# Patient Record
Sex: Female | Born: 1945
Health system: Southern US, Community
[De-identification: ages and names within clinical notes are randomized; demographics above are authoritative.]

## PROBLEM LIST (undated history)

## (undated) DIAGNOSIS — K219 Gastro-esophageal reflux disease without esophagitis: Secondary | ICD-10-CM

## (undated) DIAGNOSIS — E7800 Pure hypercholesterolemia, unspecified: Secondary | ICD-10-CM

## (undated) DIAGNOSIS — Z800 Family history of malignant neoplasm of digestive organs: Secondary | ICD-10-CM

## (undated) DIAGNOSIS — I10 Essential (primary) hypertension: Secondary | ICD-10-CM

## (undated) DIAGNOSIS — M1990 Unspecified osteoarthritis, unspecified site: Secondary | ICD-10-CM

## (undated) DIAGNOSIS — C801 Malignant (primary) neoplasm, unspecified: Secondary | ICD-10-CM

## (undated) DIAGNOSIS — I8290 Acute embolism and thrombosis of unspecified vein: Secondary | ICD-10-CM

## (undated) DIAGNOSIS — R600 Localized edema: Secondary | ICD-10-CM

## (undated) DIAGNOSIS — R32 Unspecified urinary incontinence: Secondary | ICD-10-CM

## (undated) DIAGNOSIS — Z9221 Personal history of antineoplastic chemotherapy: Secondary | ICD-10-CM

## (undated) DIAGNOSIS — T7840XA Allergy, unspecified, initial encounter: Secondary | ICD-10-CM

## (undated) HISTORY — DX: Acute embolism and thrombosis of unspecified vein: I82.90

## (undated) HISTORY — DX: Family history of malignant neoplasm of digestive organs: Z80.0

## (undated) HISTORY — PX: ABDOMINAL HYSTERECTOMY: SHX81

## (undated) HISTORY — DX: Pure hypercholesterolemia, unspecified: E78.00

## (undated) HISTORY — DX: Allergy, unspecified, initial encounter: T78.40XA

## (undated) HISTORY — DX: Unspecified urinary incontinence: R32

## (undated) MED FILL — Dexamethasone Sodium Phosphate Inj 100 MG/10ML: INTRAMUSCULAR | Qty: 1 | Status: AC

---
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
## 1969-10-16 HISTORY — PX: CHOLECYSTECTOMY: SHX55

## 2004-09-18 ENCOUNTER — Emergency Department: Payer: Self-pay | Admitting: Unknown Physician Specialty

## 2007-10-03 ENCOUNTER — Ambulatory Visit: Payer: Self-pay

## 2008-10-20 ENCOUNTER — Ambulatory Visit: Payer: Self-pay

## 2009-12-10 ENCOUNTER — Ambulatory Visit: Payer: Self-pay | Admitting: Family Medicine

## 2011-02-02 ENCOUNTER — Ambulatory Visit: Payer: Self-pay

## 2012-02-13 ENCOUNTER — Ambulatory Visit: Payer: Self-pay | Admitting: Internal Medicine

## 2013-02-19 ENCOUNTER — Ambulatory Visit: Payer: Self-pay

## 2014-02-20 ENCOUNTER — Ambulatory Visit: Payer: Self-pay | Admitting: Internal Medicine

## 2015-01-29 ENCOUNTER — Other Ambulatory Visit: Payer: Self-pay | Admitting: Internal Medicine

## 2015-01-29 DIAGNOSIS — Z1231 Encounter for screening mammogram for malignant neoplasm of breast: Secondary | ICD-10-CM

## 2015-02-03 DIAGNOSIS — Z8742 Personal history of other diseases of the female genital tract: Secondary | ICD-10-CM | POA: Insufficient documentation

## 2015-02-23 ENCOUNTER — Ambulatory Visit
Admission: RE | Admit: 2015-02-23 | Discharge: 2015-02-23 | Disposition: A | Discharge status: Home / Self Care | Payer: Medicare Other | Source: Ambulatory Visit | Attending: Internal Medicine | Admitting: Internal Medicine

## 2015-02-23 DIAGNOSIS — Z1231 Encounter for screening mammogram for malignant neoplasm of breast: Secondary | ICD-10-CM

## 2016-01-12 ENCOUNTER — Other Ambulatory Visit: Payer: Self-pay | Admitting: Internal Medicine

## 2016-01-12 DIAGNOSIS — Z1231 Encounter for screening mammogram for malignant neoplasm of breast: Secondary | ICD-10-CM

## 2016-02-24 ENCOUNTER — Other Ambulatory Visit: Payer: Self-pay | Admitting: Internal Medicine

## 2016-02-24 ENCOUNTER — Ambulatory Visit
Admission: RE | Admit: 2016-02-24 | Discharge: 2016-02-24 | Disposition: A | Discharge status: Home / Self Care | Payer: Medicare Other | Source: Ambulatory Visit | Attending: Internal Medicine | Admitting: Internal Medicine

## 2016-02-24 DIAGNOSIS — Z1231 Encounter for screening mammogram for malignant neoplasm of breast: Secondary | ICD-10-CM | POA: Insufficient documentation

## 2016-06-15 ENCOUNTER — Emergency Department
Admission: EM | Admit: 2016-06-15 | Discharge: 2016-06-15 | Disposition: A | Discharge status: Home / Self Care | Payer: No Typology Code available for payment source | Attending: Emergency Medicine | Admitting: Emergency Medicine

## 2016-06-15 ENCOUNTER — Emergency Department: Payer: No Typology Code available for payment source

## 2016-06-15 DIAGNOSIS — Z87891 Personal history of nicotine dependence: Secondary | ICD-10-CM | POA: Insufficient documentation

## 2016-06-15 DIAGNOSIS — Y92410 Unspecified street and highway as the place of occurrence of the external cause: Secondary | ICD-10-CM | POA: Insufficient documentation

## 2016-06-15 DIAGNOSIS — Y939 Activity, unspecified: Secondary | ICD-10-CM | POA: Insufficient documentation

## 2016-06-15 DIAGNOSIS — Y999 Unspecified external cause status: Secondary | ICD-10-CM | POA: Diagnosis not present

## 2016-06-15 DIAGNOSIS — R0789 Other chest pain: Secondary | ICD-10-CM | POA: Diagnosis present

## 2016-06-15 DIAGNOSIS — I10 Essential (primary) hypertension: Secondary | ICD-10-CM | POA: Diagnosis not present

## 2016-06-15 DIAGNOSIS — I6782 Cerebral ischemia: Secondary | ICD-10-CM | POA: Insufficient documentation

## 2016-06-15 HISTORY — DX: Essential (primary) hypertension: I10

## 2016-06-15 MED ORDER — ACETAMINOPHEN 500 MG PO TABS
1000.0000 mg | ORAL_TABLET | Freq: Once | ORAL | Status: AC
  Administered 2016-06-15: 1000 mg via ORAL
  Filled 2016-06-15: qty 2
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
## 2017-01-12 ENCOUNTER — Other Ambulatory Visit: Payer: Self-pay | Admitting: Internal Medicine

## 2017-01-12 DIAGNOSIS — Z1231 Encounter for screening mammogram for malignant neoplasm of breast: Secondary | ICD-10-CM

## 2017-02-26 ENCOUNTER — Ambulatory Visit
Admission: RE | Admit: 2017-02-26 | Discharge: 2017-02-26 | Disposition: A | Discharge status: Home / Self Care | Payer: Medicare Other | Source: Ambulatory Visit | Attending: Internal Medicine | Admitting: Internal Medicine

## 2017-02-26 DIAGNOSIS — Z1231 Encounter for screening mammogram for malignant neoplasm of breast: Secondary | ICD-10-CM | POA: Diagnosis present

## 2018-01-18 ENCOUNTER — Other Ambulatory Visit: Payer: Self-pay | Admitting: Internal Medicine

## 2018-01-18 DIAGNOSIS — Z1231 Encounter for screening mammogram for malignant neoplasm of breast: Secondary | ICD-10-CM

## 2018-02-28 ENCOUNTER — Ambulatory Visit
Admission: RE | Admit: 2018-02-28 | Discharge: 2018-02-28 | Disposition: A | Discharge status: Home / Self Care | Payer: Medicare Other | Source: Ambulatory Visit | Attending: Internal Medicine | Admitting: Internal Medicine

## 2018-02-28 DIAGNOSIS — Z1231 Encounter for screening mammogram for malignant neoplasm of breast: Secondary | ICD-10-CM | POA: Insufficient documentation

## 2018-08-16 ENCOUNTER — Other Ambulatory Visit (HOSPITAL_COMMUNITY)
Admission: RE | Admit: 2018-08-16 | Discharge: 2018-08-16 | Disposition: A | Discharge status: Home / Self Care | Payer: Medicare Other | Source: Ambulatory Visit | Attending: Certified Nurse Midwife | Admitting: Certified Nurse Midwife

## 2018-08-16 ENCOUNTER — Encounter: Payer: Self-pay | Admitting: Certified Nurse Midwife

## 2018-08-16 ENCOUNTER — Ambulatory Visit: Payer: Medicare Other | Admitting: Certified Nurse Midwife

## 2018-08-16 VITALS — BP 127/75 | HR 91 | Ht 64.0 in | Wt 237.5 lb

## 2018-08-16 DIAGNOSIS — N950 Postmenopausal bleeding: Secondary | ICD-10-CM | POA: Insufficient documentation

## 2018-08-16 DIAGNOSIS — Z9889 Other specified postprocedural states: Secondary | ICD-10-CM

## 2018-08-16 DIAGNOSIS — Z87440 Personal history of urinary (tract) infections: Secondary | ICD-10-CM

## 2018-08-16 DIAGNOSIS — R32 Unspecified urinary incontinence: Secondary | ICD-10-CM | POA: Diagnosis not present

## 2018-08-16 DIAGNOSIS — Z8742 Personal history of other diseases of the female genital tract: Secondary | ICD-10-CM
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
## 2018-08-16 NOTE — Patient Instructions (Addendum)
Postmenopausal Bleeding Postmenopausal bleeding is any bleeding a woman has after she has entered into menopause. Menopause is the end of a woman's fertile years. After menopause, a woman no longer ovulates or has menstrual periods. Postmenopausal bleeding can be caused by various things. Any type of postmenopausal bleeding, even if it appears to be a typical menstrual period, is concerning. This should be evaluated by your health care provider. Any treatment will depend on the cause of the bleeding. Follow these instructions at home: Monitor your condition for any changes. The following actions may help to alleviate any discomfort you are experiencing:  Avoid the use of tampons and douches as directed by your health care provider.  Change your pads frequently.  Get regular pelvic exams and Pap tests.  Keep all follow-up appointments for diagnostic tests as directed by your health care provider.  Contact a health care provider if:  Your bleeding lasts more than 1 week.  You have abdominal pain.  You have bleeding with sexual intercourse. Get help right away if:  You have a fever, chills, headache, dizziness, muscle aches, and bleeding.  You have severe pain with bleeding.  You are passing blood clots.  You have bleeding and need more than 1 pad an hour.  You feel faint. This information is not intended to replace advice given to you by your health care provider. Make sure you discuss any questions you have with your health care provider. Document Released: 01/10/2006 Document Revised: 03/09/2016 Document Reviewed: 05/01/2013 Elsevier Interactive Patient Education  2018 Elsevier Inc.  

## 2018-08-18 LAB — URINE CULTURE

## 2018-08-19 ENCOUNTER — Telehealth: Payer: Self-pay | Admitting: Certified Nurse Midwife

## 2018-08-19 LAB — CERVICOVAGINAL ANCILLARY ONLY
Bacterial vaginitis: NEGATIVE
CANDIDA VAGINITIS: NEGATIVE

## 2018-08-19 NOTE — Telephone Encounter (Signed)
Spoke with patient, results given.  Patient verbalized understanding.

## 2018-08-19 NOTE — Telephone Encounter (Signed)
The patient asked to speak to Michelle's nurse regarding her recent visit, please advise, thanks.

## 2018-08-19 NOTE — Progress Notes (Signed)
Please contact patient, swab and urine negative for infection. Please keep follow up appointment as scheduled. Thanks, JML

## 2018-08-20 ENCOUNTER — Other Ambulatory Visit: Payer: Self-pay | Admitting: Certified Nurse Midwife

## 2018-08-20 DIAGNOSIS — N950 Postmenopausal bleeding: Secondary | ICD-10-CM

## 2018-08-22 ENCOUNTER — Other Ambulatory Visit: Payer: Medicare Other

## 2018-08-22 ENCOUNTER — Encounter: Payer: Medicare Other | Admitting: Obstetrics and Gynecology

## 2018-10-11 ENCOUNTER — Encounter: Payer: Self-pay | Admitting: Certified Nurse Midwife

## 2018-10-11 ENCOUNTER — Ambulatory Visit: Payer: Medicare Other | Admitting: Certified Nurse Midwife

## 2018-10-11 VITALS — BP 138/63 | HR 89 | Ht 64.0 in | Wt 228.6 lb

## 2018-10-11 DIAGNOSIS — N950 Postmenopausal bleeding: Secondary | ICD-10-CM

## 2018-10-11 NOTE — Patient Instructions (Signed)
Postmenopausal Bleeding    Postmenopausal bleeding is any bleeding that happens after menopause. Menopause is when a woman's period stops. Any type of bleeding after menopause should be checked by your doctor. Treatment will depend on the cause.  Follow these instructions at home:   Pay attention to any changes in your symptoms.   Avoid using tampons and douches as told by your doctor.   Change your pads regularly.   Get regular pelvic exams and Pap tests.   Take iron pills as told by your doctor.   Take over-the-counter and prescription medicines only as told by your doctor.   Keep all follow-up visits as told by your doctor. This is important.  Contact a doctor if:   Your bleeding lasts for more than 1 week.   You have belly (abdominal) pain.   You have bleeding during or after sex (intercourse).   You have bleeding that happens more often than every 3 weeks.  Get help right away if:   You have a fever, chills, a headache, dizziness, muscle aches, and bleeding.   You have strong pain with bleeding.   You have clumps of blood (blood clots) coming from your vagina.   You have a lot of bleeding and:  ? You need more than 1 pad an hour.  ? This has never happened before.   You feel like you are going to pass out (faint).  Summary   Any type of bleeding after menopause should be checked by your doctor.   Pay attention to any changes in your symptoms.   Keep all follow-up visits as told by your doctor.  This information is not intended to replace advice given to you by your health care provider. Make sure you discuss any questions you have with your health care provider.  Document Released: 07/11/2008 Document Revised: 11/07/2016 Document Reviewed: 11/07/2016  Elsevier Interactive Patient Education  2019 Elsevier Inc.

## 2018-10-11 NOTE — Progress Notes (Signed)
Patient seen on 08/16/2018 for similar symptoms and advised to schedule ultrasound and follow up visit with Dr. Marcelline Mates. Appointment scheduled for 08/22/2018 cancelled by patient. Presents today for follow up appointment. No ultrasound in office today. Patient rescheduled for 10/29/2018 for ultrasound and follow up visit with Dr. Marcelline Mates.    [name], CNM [facility], [facility] 10/11/18 12:04 PM

## 2018-10-14 DIAGNOSIS — R7301 Impaired fasting glucose: Secondary | ICD-10-CM | POA: Insufficient documentation

## 2018-10-29 ENCOUNTER — Ambulatory Visit: Payer: Medicare Other | Admitting: Obstetrics and Gynecology

## 2018-10-29 ENCOUNTER — Encounter: Payer: Self-pay | Admitting: Obstetrics and Gynecology

## 2018-10-29 ENCOUNTER — Ambulatory Visit (INDEPENDENT_AMBULATORY_CARE_PROVIDER_SITE_OTHER): Payer: Medicare Other

## 2018-10-29 ENCOUNTER — Other Ambulatory Visit (HOSPITAL_COMMUNITY)
Admission: RE | Admit: 2018-10-29 | Discharge: 2018-10-29 | Disposition: A | Discharge status: Home / Self Care | Payer: Medicare Other | Source: Ambulatory Visit | Attending: Obstetrics and Gynecology | Admitting: Obstetrics and Gynecology

## 2018-10-29 VITALS — BP 128/74 | HR 73 | Ht 64.0 in | Wt 229.1 lb

## 2018-10-29 DIAGNOSIS — R9389 Abnormal findings on diagnostic imaging of other specified body structures: Secondary | ICD-10-CM

## 2018-10-29 DIAGNOSIS — N183 Chronic kidney disease, stage 3 (moderate): Secondary | ICD-10-CM

## 2018-10-29 DIAGNOSIS — E785 Hyperlipidemia, unspecified: Secondary | ICD-10-CM | POA: Insufficient documentation

## 2018-10-29 DIAGNOSIS — J309 Allergic rhinitis, unspecified: Secondary | ICD-10-CM | POA: Insufficient documentation

## 2018-10-29 DIAGNOSIS — N182 Chronic kidney disease, stage 2 (mild): Secondary | ICD-10-CM | POA: Insufficient documentation

## 2018-10-29 DIAGNOSIS — N950 Postmenopausal bleeding: Secondary | ICD-10-CM

## 2018-10-29 DIAGNOSIS — E669 Obesity, unspecified: Secondary | ICD-10-CM | POA: Diagnosis not present

## 2018-10-29 DIAGNOSIS — I129 Hypertensive chronic kidney disease with stage 1 through stage 4 chronic kidney disease, or unspecified chronic kidney disease: Secondary | ICD-10-CM | POA: Insufficient documentation

## 2018-10-29 DIAGNOSIS — N888 Other specified noninflammatory disorders of cervix uteri: Secondary | ICD-10-CM | POA: Diagnosis not present

## 2018-10-29 DIAGNOSIS — Z91410 Personal history of adult physical and sexual abuse: Secondary | ICD-10-CM
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
## 2018-10-29 NOTE — Progress Notes (Signed)
Pt stated that she is having some burning the vaginal area that she noticed about several months ago along with abnormal bleeding. Pt stated she is also is having odor in the vaginal area. Pt stated that she thinks she may need some clean to help clear up the area.

## 2018-10-31 LAB — CYTOLOGY - PAP

## 2018-11-08 ENCOUNTER — Encounter: Payer: Self-pay | Admitting: Obstetrics and Gynecology

## 2018-11-08 ENCOUNTER — Ambulatory Visit: Payer: Medicare Other | Admitting: Obstetrics and Gynecology

## 2018-11-08 VITALS — BP 147/84 | HR 80 | Ht 64.0 in | Wt 228.6 lb

## 2018-11-08 DIAGNOSIS — N950 Postmenopausal bleeding: Secondary | ICD-10-CM

## 2018-11-08 DIAGNOSIS — R030 Elevated blood-pressure reading, without diagnosis of hypertension: Secondary | ICD-10-CM

## 2018-11-08 DIAGNOSIS — C541 Malignant neoplasm of endometrium: Secondary | ICD-10-CM

## 2018-11-08 DIAGNOSIS — Z91410 Personal history of adult physical and sexual abuse: Secondary | ICD-10-CM | POA: Diagnosis not present

## 2018-11-08 NOTE — Patient Instructions (Signed)
Uterine Cancer  Uterine cancer is an abnormal growth of cancer tissue (malignant tumor) in the uterus. Unlike noncancerous (benign) tumors, malignant tumors can spread to other parts of the body. Uterine cancer usually occurs after menopause. However, it may also occur around the time that menopause begins. The wall of the uterus has an inner layer of tissue (endometrium) and an outer layer of muscle tissue (myometrium). The most common type of uterine cancer begins in the endometrium (endometrial cancer). Cancer that begins in the myometrium (uterine sarcoma) is very rare. What are the causes? The exact cause of this condition is not known. What increases the risk? You are more likely to develop this condition if you:  Are older than 50.  Have an enlarged endometrium (endometrial hyperplasia).  Use hormone therapy.  Are severely overweight (obese).  Use the medicine tamoxifen.  You are white (Caucasian).  Cannot bear children (are infertile).  Have never been pregnant.  Started menstruating at an age younger than 12 years.  Are older than 52 and are still having menstrual periods.  Have a history of cancer of the ovaries, intestines, or colon or rectum (colorectal cancer).  Have a history of enlarged ovaries with small cysts (polycystic ovarian syndrome).  Have a family history of: ? Uterine cancer. ? Hereditary nonpolyposis colon cancer (HNPCC).  Have diabetes, high blood pressure, thyroid disease, or gallbladder disease.  Use long-term, high-dose birth control pills.  Have been exposed to radiation.  Smoke. What are the signs or symptoms? Symptoms of this condition include:  Abnormal vaginal bleeding or discharge. Bleeding may start as a watery, blood-streaked flow that gradually contains more blood. This is the most common symptom. If you experience abnormal vaginal bleeding, do not assume that it is part of menopause.  Vaginal bleeding after  menopause.  Unexplained weight loss.  Bleeding between periods.  Urination that is difficult, painful, or more frequent than usual.  A lump (mass) in the vagina.  Pain, bloating, or fullness in the abdomen.  Pain in the pelvic area.  Pain during sex. How is this diagnosed? This condition may be diagnosed based on:  Your medical history and your symptoms.  A physical and pelvic exam. Your health care provider will feel your pelvis for any growths or enlarged lymph nodes.  Blood and urine tests.  Imaging tests, such as X-rays, CT scans, ultrasound, or MRIs.  A procedure in which a thin, flexible tube with a light and camera on the end is inserted through the vagina and used to look inside the uterus (hysteroscopy).  A Pap test to check for abnormal cells in the lower part of the uterus (cervix) and the upper vagina.  Removing a tissue sample (biopsy) from the uterine lining to check for cancer cells.  Dilation and curettage (D&C). This is a procedure that involves stretching (dilation) the cervix and scraping (curettage) the inside lining of the uterus to get a biopsy and check for cancer cells. Your cancer will be staged to determine its severity and extent. Staging is an assessment of:  The size of the tumor.  Whether the cancer has spread.  Where the cancer has spread. The stages of uterine cancer are as follows:  Stage I. The cancer is only found in the uterus.  Stage II. The cancer has spread to the cervix.  Stage III. The cancer has spread outside the uterus, but not outside the pelvis. The cancer may have spread to the lymph nodes in the pelvis. Lymph nodes are   part of your body's disease-fighting (immune) system. Lymph nodes are found in many locations in your body, including the neck, underarm, and groin.  Stage IV. The cancer has spread to other parts of the body, such as the bladder or rectum. How is this treated? This condition is often treated with surgery to remove:   The uterus, cervix, fallopian tubes, and ovaries (total hysterectomy).  The uterus and cervix (simple hysterectomy). The type of hysterectomy you will have depends on the extent of your cancer. Lymph nodes near the uterus may also be removed in some cases. Treatment may also include one or more of the following:  Chemotherapy. This uses medicines to kill the cancer cells and prevent their spread.  Radiation therapy. This uses high-energy rays to kill the cancer cells and prevent the spread of cancer.  Chemoradiation. This is a combination treatment that alternates chemotherapy with radiation treatments to enhance the way radiation works.  Brachytherapy. This involves placing radioactive materials inside the body where the cancer was removed.  Hormone therapy. This includes taking medicines that lower the levels of estrogen in the body. Follow these instructions at home: Activity  Return to your normal activities as told by your health care provider. Ask your health care provider what activities are safe for you.  Exercise regularly as told by your health care provider.  Do not drive or use heavy machinery while taking prescription pain medicine. General instructions  Take over-the-counter and prescription medicines only as told by your health care provider.  Maintain a healthy diet.  Work with your health care provider to: ? Manage any long-term (chronic) conditions you have, such as diabetes, high blood pressure, thyroid disease, or gallbladder disease. ? Manage any side effects of your treatment.  Do not use any products that contain nicotine or tobacco, such as cigarettes and e-cigarettes. If you need help quitting, ask your health care provider.  Consider joining a support group to help you cope with stress. Your health care provider may be able to recommend a local or online support group.  Keep all follow-up visits as told by your health care provider. This is important.  Where to find more information  American Cancer Society: https://www.cancer.org  National Cancer Institute (NCI): https://www.cancer.gov Contact a health care provider if:  You have pain in your pelvis or abdomen that gets worse.  You cannot urinate.  You have abnormal bleeding.  You have a fever. Get help right away if:  You develop sudden or new severe symptoms, such as: ? Heavy bleeding. ? Severe weakness. ? Pain that is severe or does not get better with medicine. Summary  Uterine cancer is an abnormal growth of cancer tissue (malignant tumor) in the uterus. The most common type of uterine cancer begins in the endometrium (endometrial cancer).  This condition is often treated with surgery to remove the uterus, cervix, fallopian tubes, and ovaries (total hysterectomy) or the uterus and cervix (simple hysterectomy).  Work with your health care provider to manage any long-term (chronic) conditions you have, such as diabetes, high blood pressure, thyroid disease, or gallbladder disease.  Consider joining a support group to help you cope with stress. Your health care provider may be able to recommend a local or online support group. This information is not intended to replace advice given to you by your health care provider. Make sure you discuss any questions you have with your health care provider. Document Released: 10/02/2005 Document Revised: 09/29/2016 Document Reviewed: 09/29/2016 Elsevier Interactive Patient Education    2019 [employer].      Hysterectomy Information  A hysterectomy is a surgery in which the uterus is removed. The fallopian tubes and ovaries may be removed (bilateral salpingo-oophorectomy) as well. This procedure may be done to treat various medical problems. After the procedure, a woman will no longer have menstrual periods nor will she be able to become pregnant (sterile). What are the reasons for a hysterectomy? There are many reasons  why a woman might have this procedure. They include:  Persistent, abnormal vaginal bleeding.  Long-term (chronic) pelvic pain or infection.  Endometriosis. This is when the lining of the uterus (endometrium) starts to grow outside the uterus.  Adenomyosis. This is when the endometrium starts to grow in the muscle of the uterus.  Pelvic organ prolapse. This is a condition in which the uterus falls down into the vagina.  Noncancerous growths in the uterus (uterine fibroids) that cause symptoms.  The presence of precancerous cells.  Cervical or uterine cancer. What are the different types of hysterectomy? There are three different types of hysterectomy:  Supracervical hysterectomy. In this type, the top part of the uterus is removed, but not the cervix.  Total hysterectomy. In this type, the uterus and cervix are removed.  Radical hysterectomy. In this type, the uterus, the cervix, and the tissue that holds the uterus in place (parametrium) are removed. What are the different ways a hysterectomy can be performed? There are many different ways a hysterectomy can be performed, including:  Abdominal hysterectomy. In this type, an incision is made in the abdomen. The uterus is removed through this incision.  Vaginal hysterectomy. In this type, an incision is made in the vagina. The uterus is removed through this incision. There are no abdominal incisions.  Conventional laparoscopic hysterectomy. In this type, three or four small incisions are made in the abdomen. A thin, lighted tube with a camera (laparoscope) is inserted into one of the incisions. Other tools are put through the other incisions. The uterus is cut into small pieces. The small pieces are removed through the incisions or through the vagina.  Laparoscopically assisted vaginal hysterectomy (LAVH). In this type, three or four small incisions are made in the abdomen. Part of the surgery is performed laparoscopically and the  other part is done vaginally. The uterus is removed through the vagina.  Robot-assisted laparoscopic hysterectomy. In this type, a laparoscope and other tools are inserted into three or four small incisions in the abdomen. A computer-controlled device is used to give the surgeon a 3D image and to help control the surgical instruments. This allows for more precise movements of surgical instruments. The uterus is cut into small pieces and removed through the incisions or removed through the vagina. Discuss the options with your health care provider to determine which type is the right one for you. What are the risks? Generally, this is a safe procedure. However, problems may occur, including:  Bleeding and risk of blood transfusion. Tell your health care provider if you do not want to receive any blood products.  Blood clots in the legs or lung.  Infection.  Damage to other structures or organs.  Allergic reactions to medicines.  Changing to an abdominal hysterectomy from one of the other techniques. What to expect after a hysterectomy  You will be given pain medicine.  You may need to stay in the hospital for 1- 2 days to recover, depending on the type of hysterectomy you had.  Follow your health care provider's  instructions about exercise, driving, and general activities. Ask your health care provider what activities are safe for you.  You will need to have someone with you for the first 3-5 days after you go home.  You will need to follow up with your surgeon in 2-4 weeks after surgery to evaluate your progress.  If the ovaries are removed, you will have early menopause symptoms such as hot flashes, night sweats, and insomnia.  If you had a hysterectomy for a problem that was not cancer or not a condition that could lead to cancer, then you no longer need Pap tests. However, even if you no longer need a Pap test, a regular pelvic exam is a good idea to make sure no other problems are  developing. Questions to ask your health care provider  Is a hysterectomy medically necessary? Do I have other treatment options for my condition?  What are my options for hysterectomy procedure?  What organs and tissues need to be removed?  What are the risks?  What are the benefits?  How long will I need to stay in the hospital after the procedure?  How long will I need to recover at home?  What symptoms can I expect after the procedure? Summary  A hysterectomy is a surgery in which the uterus is removed. The fallopian tubes and ovaries may be removed (bilateral salpingo-oophorectomy) as well.  This procedure may be done to treat various medical problems. After the procedure, a woman will no longer have menstrual periods nor will she be able to become pregnant.  Discuss the options with your health care provider to determine which type of hysterectomy is the right one for you. This information is not intended to replace advice given to you by your health care provider. Make sure you discuss any questions you have with your health care provider. Document Released: 03/28/2001 Document Revised: 11/08/2016 Document Reviewed: 11/08/2016 Elsevier Interactive Patient Education  2019 [employer].

## 2018-11-08 NOTE — Progress Notes (Signed)
Pt present for test results from endometrial bx.  Pt stated that she was nervous and scared.
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
## 2018-11-11 ENCOUNTER — Telehealth: Payer: Self-pay

## 2018-11-11 NOTE — Telephone Encounter (Signed)
Voicemail left on home and mobile number. We can see Jacqueline Yoder this Wednesday, 1/29, in [facility] for new diagnosis of endometrial cancer.

## 2018-11-11 NOTE — Telephone Encounter (Signed)
Received returned call from daughter, Jacqueline Yoder. We have scheduled Jacqueline Yoder to see Dr. Theora Gianotti this Wednesday, 1/29, at 1300. Instructed to bring picture ID and any insurance cards. Educated on what to expect during this consult.

## 2018-11-13 ENCOUNTER — Inpatient Hospital Stay: Payer: Medicare Other | Attending: Obstetrics and Gynecology | Admitting: Obstetrics and Gynecology

## 2018-11-13 ENCOUNTER — Inpatient Hospital Stay: Payer: Medicare Other

## 2018-11-13 ENCOUNTER — Other Ambulatory Visit: Payer: Self-pay

## 2018-11-13 VITALS — BP 132/81 | HR 94 | Temp 98.6°F | Resp 16 | Ht 64.0 in | Wt 225.1 lb

## 2018-11-13 DIAGNOSIS — Z87891 Personal history of nicotine dependence: Secondary | ICD-10-CM | POA: Diagnosis not present

## 2018-11-13 DIAGNOSIS — C541 Malignant neoplasm of endometrium: Secondary | ICD-10-CM | POA: Diagnosis present

## 2018-11-13 DIAGNOSIS — N393 Stress incontinence (female) (male): Secondary | ICD-10-CM | POA: Diagnosis not present

## 2018-11-13 LAB — CBC WITH DIFFERENTIAL/PLATELET
Abs Immature Granulocytes: 0.01 10*3/uL (ref 0.00–0.07)
BASOS PCT: 1 %
Basophils Absolute: 0 10*3/uL (ref 0.0–0.1)
Eosinophils Absolute: 0.1 10*3/uL (ref 0.0–0.5)
Eosinophils Relative: 1 %
HCT: 42.4 % (ref 36.0–46.0)
Hemoglobin: 13.6 g/dL (ref 12.0–15.0)
Immature Granulocytes: 0 %
Lymphocytes Relative: 40 %
Lymphs Abs: 2.4 10*3/uL (ref 0.7–4.0)
MCH: 29.1 pg (ref 26.0–34.0)
MCHC: 32.1 g/dL (ref 30.0–36.0)
MCV: 90.8 fL (ref 80.0–100.0)
MONO ABS: 0.5 10*3/uL (ref 0.1–1.0)
MONOS PCT: 8 %
Neutro Abs: 2.9 10*3/uL (ref 1.7–7.7)
Neutrophils Relative %: 50 %
PLATELETS: 280 10*3/uL (ref 150–400)
RBC: 4.67 MIL/uL (ref 3.87–5.11)
RDW: 13.4 % (ref 11.5–15.5)
WBC: 5.8 10*3/uL (ref 4.0–10.5)
nRBC: 0 % (ref 0.0–0.2)

## 2018-11-13 LAB — CREATININE, SERUM
Creatinine, Ser: 1.13 mg/dL — ABNORMAL HIGH (ref 0.44–1.00)
GFR, EST AFRICAN AMERICAN: 57 mL/min — AB (ref >60)
GFR, EST NON AFRICAN AMERICAN: 49 mL/min — AB (ref >60)

## 2018-11-13 NOTE — Progress Notes (Signed)
CT scheduled for 2/4. She will see Dr. Fransisca Connors 2/5 for results. Instructions for CT scan given.
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
## 2018-11-13 NOTE — Patient Instructions (Signed)
Uterine Cancer  Uterine cancer is an abnormal growth of cancer tissue (malignant tumor) in the uterus. Unlike noncancerous (benign) tumors, malignant tumors can spread to other parts of the body. Uterine cancer usually occurs after menopause. However, it may also occur around the time that menopause begins. The wall of the uterus has an inner layer of tissue (endometrium) and an outer layer of muscle tissue (myometrium). The most common type of uterine cancer begins in the endometrium (endometrial cancer). Cancer that begins in the myometrium (uterine sarcoma) is very rare. What are the causes? The exact cause of this condition is not known. What increases the risk? You are more likely to develop this condition if you:  Are older than 50.  Have an enlarged endometrium (endometrial hyperplasia).  Use hormone therapy.  Are severely overweight (obese).  Use the medicine tamoxifen.  You are white (Caucasian).  Cannot bear children (are infertile).  Have never been pregnant.  Started menstruating at an age younger than 12 years.  Are older than 52 and are still having menstrual periods.  Have a history of cancer of the ovaries, intestines, or colon or rectum (colorectal cancer).  Have a history of enlarged ovaries with small cysts (polycystic ovarian syndrome).  Have a family history of: ? Uterine cancer. ? Hereditary nonpolyposis colon cancer (HNPCC).  Have diabetes, high blood pressure, thyroid disease, or gallbladder disease.  Use long-term, high-dose birth control pills.  Have been exposed to radiation.  Smoke. What are the signs or symptoms? Symptoms of this condition include:  Abnormal vaginal bleeding or discharge. Bleeding may start as a watery, blood-streaked flow that gradually contains more blood. This is the most common symptom. If you experience abnormal vaginal bleeding, do not assume that it is part of menopause.  Vaginal bleeding after  menopause.  Unexplained weight loss.  Bleeding between periods.  Urination that is difficult, painful, or more frequent than usual.  A lump (mass) in the vagina.  Pain, bloating, or fullness in the abdomen.  Pain in the pelvic area.  Pain during sex. How is this diagnosed? This condition may be diagnosed based on:  Your medical history and your symptoms.  A physical and pelvic exam. Your health care provider will feel your pelvis for any growths or enlarged lymph nodes.  Blood and urine tests.  Imaging tests, such as X-rays, CT scans, ultrasound, or MRIs.  A procedure in which a thin, flexible tube with a light and camera on the end is inserted through the vagina and used to look inside the uterus (hysteroscopy).  A Pap test to check for abnormal cells in the lower part of the uterus (cervix) and the upper vagina.  Removing a tissue sample (biopsy) from the uterine lining to check for cancer cells.  Dilation and curettage (D&C). This is a procedure that involves stretching (dilation) the cervix and scraping (curettage) the inside lining of the uterus to get a biopsy and check for cancer cells. Your cancer will be staged to determine its severity and extent. Staging is an assessment of:  The size of the tumor.  Whether the cancer has spread.  Where the cancer has spread. The stages of uterine cancer are as follows:  Stage I. The cancer is only found in the uterus.  Stage II. The cancer has spread to the cervix.  Stage III. The cancer has spread outside the uterus, but not outside the pelvis. The cancer may have spread to the lymph nodes in the pelvis. Lymph nodes are   part of your body's disease-fighting (immune) system. Lymph nodes are found in many locations in your body, including the neck, underarm, and groin.  Stage IV. The cancer has spread to other parts of the body, such as the bladder or rectum. How is this treated? This condition is often treated with surgery to remove:   The uterus, cervix, fallopian tubes, and ovaries (total hysterectomy).  The uterus and cervix (simple hysterectomy). The type of hysterectomy you will have depends on the extent of your cancer. Lymph nodes near the uterus may also be removed in some cases. Treatment may also include one or more of the following:  Chemotherapy. This uses medicines to kill the cancer cells and prevent their spread.  Radiation therapy. This uses high-energy rays to kill the cancer cells and prevent the spread of cancer.  Chemoradiation. This is a combination treatment that alternates chemotherapy with radiation treatments to enhance the way radiation works.  Brachytherapy. This involves placing radioactive materials inside the body where the cancer was removed.  Hormone therapy. This includes taking medicines that lower the levels of estrogen in the body. Follow these instructions at home: Activity  Return to your normal activities as told by your health care provider. Ask your health care provider what activities are safe for you.  Exercise regularly as told by your health care provider.  Do not drive or use heavy machinery while taking prescription pain medicine. General instructions  Take over-the-counter and prescription medicines only as told by your health care provider.  Maintain a healthy diet.  Work with your health care provider to: ? Manage any long-term (chronic) conditions you have, such as diabetes, high blood pressure, thyroid disease, or gallbladder disease. ? Manage any side effects of your treatment.  Do not use any products that contain nicotine or tobacco, such as cigarettes and e-cigarettes. If you need help quitting, ask your health care provider.  Consider joining a support group to help you cope with stress. Your health care provider may be able to recommend a local or online support group.  Keep all follow-up visits as told by your health care provider. This is important.  Where to find more information  American Cancer Society: https://www.cancer.org  National Cancer Institute (NCI): https://www.cancer.gov Contact a health care provider if:  You have pain in your pelvis or abdomen that gets worse.  You cannot urinate.  You have abnormal bleeding.  You have a fever. Get help right away if:  You develop sudden or new severe symptoms, such as: ? Heavy bleeding. ? Severe weakness. ? Pain that is severe or does not get better with medicine. Summary  Uterine cancer is an abnormal growth of cancer tissue (malignant tumor) in the uterus. The most common type of uterine cancer begins in the endometrium (endometrial cancer).  This condition is often treated with surgery to remove the uterus, cervix, fallopian tubes, and ovaries (total hysterectomy) or the uterus and cervix (simple hysterectomy).  Work with your health care provider to manage any long-term (chronic) conditions you have, such as diabetes, high blood pressure, thyroid disease, or gallbladder disease.  Consider joining a support group to help you cope with stress. Your health care provider may be able to recommend a local or online support group. This information is not intended to replace advice given to you by your health care provider. Make sure you discuss any questions you have with your health care provider. Document Released: 10/02/2005 Document Revised: 09/29/2016 Document Reviewed: 09/29/2016 Elsevier Interactive Patient Education    2019 [place].

## 2018-11-13 NOTE — Progress Notes (Signed)
Patient referred by Dr. Marcelline Mates. She reports spotting for about a year. No complaints of pain, appetite poor.

## 2018-11-19 ENCOUNTER — Ambulatory Visit
Admission: RE | Admit: 2018-11-19 | Discharge: 2018-11-19 | Disposition: A | Discharge status: Home / Self Care | Payer: Medicare Other | Source: Ambulatory Visit | Attending: Nurse Practitioner | Admitting: Nurse Practitioner

## 2018-11-19 DIAGNOSIS — C541 Malignant neoplasm of endometrium: Secondary | ICD-10-CM | POA: Insufficient documentation

## 2018-11-19 MED ORDER — IOPAMIDOL (ISOVUE-300) INJECTION 61%
100.0000 mL | Freq: Once | INTRAVENOUS | Status: AC | PRN
  Administered 2018-11-19: 100 mL via INTRAVENOUS

## 2018-11-20 ENCOUNTER — Inpatient Hospital Stay: Payer: Medicare Other

## 2018-11-20 ENCOUNTER — Other Ambulatory Visit: Payer: Self-pay

## 2018-11-20 ENCOUNTER — Encounter: Payer: Self-pay | Admitting: *Deleted

## 2018-11-20 ENCOUNTER — Inpatient Hospital Stay: Payer: Medicare Other | Attending: Obstetrics and Gynecology | Admitting: Obstetrics and Gynecology

## 2018-11-20 VITALS — BP 112/72 | HR 98 | Temp 97.6°F | Resp 18 | Wt 223.3 lb

## 2018-11-20 DIAGNOSIS — C541 Malignant neoplasm of endometrium: Secondary | ICD-10-CM | POA: Insufficient documentation
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
## 2018-11-20 NOTE — Progress Notes (Signed)
Met with patient Jacqueline Yoder and her daughter [name] in GYN clinic this afternoon to discuss participation in the [employer] 2018-01 research study. Study was introduced by Beckey Rutter, NP and she informed Lauren that she was interested in participating. Informed consent form was reviewed with patient and daughter page by page emphasizing that participation is strictly voluntary and that she can withdraw at any time and for any reason. Informed that we will collect 5 vials of blood along with information about her personal and cancer history to submit to the study, and discussed how we maintain her privacy. Reviewed potential risks and benefits and informed that she will be provided a $50.00 gift card at the completion of her participation. Patient made aware that neither she nor her physician or the research department will be given results of the central blood testing, and that study results will be published collectively, but we do not have a date for this. After consent and HIPAA forms were reviewed thoroughly, Ms. Welsch and her daughter were allowed to ask any questions. Patient provided written consent to [employer] protocol 2018-01, version 3.0 with IRB date 08/13/2018 and the accompanying HIPAA. A copy of the signed consents was provided to patient along with my contact information in the event she has questions or changes her mind. Patient completed the short questionnaire form for the study. Her lab appointment was scheduled for today and patient escorted to the lab for blood collection. The patient had a CT scan with contrast yesterday and IV contrast was administered at 3:03pm. Central study specimen was collected after 3:20pm today - exceeding the 24hour requirement following a contrasted scan. 5 vials of blood were collected in the study specified drawing order and will be processed today, then shipped in the morning. Patient was provided with her $50 gift card for which she signed as  having received. Patient thanked personally for participating in research. Yolande Jolly, BSN, MHA, OCN 11/20/2018 3:41 PM

## 2018-11-20 NOTE — Progress Notes (Signed)
Invasive checklist faxed to special scheduling to arrange FNA of left inguinal lymph node.

## 2018-11-20 NOTE — Progress Notes (Signed)
Here for follow up. Per pt overall " I think im doing pretty good " no voiced c/o  Stated she does have spotting -brownish tone, no odor ,small amt -per pt.

## 2018-11-21 ENCOUNTER — Other Ambulatory Visit: Payer: Self-pay

## 2018-11-21 DIAGNOSIS — C541 Malignant neoplasm of endometrium: Secondary | ICD-10-CM

## 2018-11-21 NOTE — Progress Notes (Unsigned)
fna

## 2018-11-22 ENCOUNTER — Telehealth: Payer: Self-pay | Admitting: *Deleted

## 2018-11-22 ENCOUNTER — Telehealth: Payer: Self-pay

## 2018-11-22 NOTE — Telephone Encounter (Signed)
Biopsy has been scheduled. I have left a voicemail with [name] to return call for details. Appointment is also available on my chart.

## 2018-11-22 NOTE — Telephone Encounter (Signed)
T/C made to patient Jacqueline Yoder to clarify questions that I encountered while entering data for the [employer] study she is participating in. The patient reports she did take birth control pills for about 14 years from 6 until 1980. She also reports she experienced menopause at age 73. Questioned patient about her previous report of no close relatives with cancer after seeing a note reporting her brother had cancer. Patient states this was her half brother and he had prostate cancer years ago. Patient thanked for her time and for participating in research. States she is now awaiting a call for an appointment for a biopsy of a lymph node. Jacqueline Yoder, BSN, MHA, OCN 11/22/2018 9:47 AM

## 2018-11-25 ENCOUNTER — Other Ambulatory Visit: Payer: Self-pay

## 2018-11-25 ENCOUNTER — Telehealth: Payer: Self-pay

## 2018-11-25 ENCOUNTER — Other Ambulatory Visit: Payer: Self-pay | Admitting: Radiology

## 2018-11-25 NOTE — Telephone Encounter (Signed)
Jacqueline Yoder returned call. She has received all of her instructions for her biopsy. She has no further questions at this time.

## 2018-11-25 NOTE — Telephone Encounter (Signed)
Voicemail left with Ms. Menor to return call regarding biopsy that has been scheduled for 11/26/18. Neither she or her daughter have returned call.

## 2018-11-26 ENCOUNTER — Other Ambulatory Visit: Payer: Self-pay

## 2018-11-26 ENCOUNTER — Ambulatory Visit
Admission: RE | Admit: 2018-11-26 | Discharge: 2018-11-26 | Disposition: A | Discharge status: Home / Self Care | Payer: Medicare Other | Source: Ambulatory Visit | Attending: Nurse Practitioner | Admitting: Nurse Practitioner

## 2018-11-26 DIAGNOSIS — Z791 Long term (current) use of non-steroidal anti-inflammatories (NSAID): Secondary | ICD-10-CM | POA: Insufficient documentation

## 2018-11-26 DIAGNOSIS — Z7952 Long term (current) use of systemic steroids: Secondary | ICD-10-CM | POA: Diagnosis not present

## 2018-11-26 DIAGNOSIS — I10 Essential (primary) hypertension: Secondary | ICD-10-CM | POA: Insufficient documentation

## 2018-11-26 DIAGNOSIS — N950 Postmenopausal bleeding: Secondary | ICD-10-CM | POA: Diagnosis not present

## 2018-11-26 DIAGNOSIS — Z79899 Other long term (current) drug therapy: Secondary | ICD-10-CM | POA: Diagnosis not present

## 2018-11-26 DIAGNOSIS — C541 Malignant neoplasm of endometrium: Secondary | ICD-10-CM | POA: Insufficient documentation

## 2018-11-26 DIAGNOSIS — E7800 Pure hypercholesterolemia, unspecified: Secondary | ICD-10-CM | POA: Insufficient documentation

## 2018-11-26 DIAGNOSIS — K219 Gastro-esophageal reflux disease without esophagitis: Secondary | ICD-10-CM | POA: Insufficient documentation

## 2018-11-26 DIAGNOSIS — Z881 Allergy status to other antibiotic agents status: Secondary | ICD-10-CM | POA: Diagnosis not present

## 2018-11-26 DIAGNOSIS — I2510 Atherosclerotic heart disease of native coronary artery without angina pectoris: Secondary | ICD-10-CM | POA: Diagnosis not present

## 2018-11-26 DIAGNOSIS — Z87891 Personal history of nicotine dependence: Secondary | ICD-10-CM | POA: Insufficient documentation

## 2018-11-26 DIAGNOSIS — R590 Localized enlarged lymph nodes: Secondary | ICD-10-CM | POA: Diagnosis not present

## 2018-11-26 DIAGNOSIS — Z809 Family history of malignant neoplasm, unspecified: Secondary | ICD-10-CM | POA: Diagnosis not present

## 2018-11-26 DIAGNOSIS — I700 Atherosclerosis of aorta: Secondary | ICD-10-CM | POA: Insufficient documentation

## 2018-11-26 DIAGNOSIS — Z9049 Acquired absence of other specified parts of digestive tract: Secondary | ICD-10-CM | POA: Diagnosis not present

## 2018-11-26 LAB — CBC
HCT: 42.1 % (ref 36.0–46.0)
Hemoglobin: 13.8 g/dL (ref 12.0–15.0)
MCH: 29.4 pg (ref 26.0–34.0)
MCHC: 32.8 g/dL (ref 30.0–36.0)
MCV: 89.8 fL (ref 80.0–100.0)
Platelets: 278 10*3/uL (ref 150–400)
RBC: 4.69 MIL/uL (ref 3.87–5.11)
RDW: 13.4 % (ref 11.5–15.5)
WBC: 5.4 10*3/uL (ref 4.0–10.5)
nRBC: 0 % (ref 0.0–0.2)

## 2018-11-26 LAB — PROTIME-INR
INR: 0.88
Prothrombin Time: 11.9 seconds (ref 11.4–15.2)

## 2018-11-26 LAB — APTT: aPTT: 33 seconds (ref 24–36)

## 2018-11-26 MED ORDER — SODIUM CHLORIDE 0.9 % IV SOLN
INTRAVENOUS | Status: DC
  Administered 2018-11-26: 10:00:00 via INTRAVENOUS

## 2018-11-26 MED ORDER — MIDAZOLAM HCL 5 MG/5ML IJ SOLN
INTRAMUSCULAR | Status: AC
  Filled 2018-11-26: qty 5

## 2018-11-26 MED ORDER — FENTANYL CITRATE (PF) 100 MCG/2ML IJ SOLN
INTRAMUSCULAR | Status: AC
  Filled 2018-11-26: qty 4

## 2018-11-26 MED ORDER — MIDAZOLAM HCL 5 MG/5ML IJ SOLN
INTRAMUSCULAR | Status: AC | PRN
  Administered 2018-11-26: 1 mg via INTRAVENOUS
  Administered 2018-11-26: 0.5 mg via INTRAVENOUS
  Administered 2018-11-26: 1 mg via INTRAVENOUS

## 2018-11-26 MED ORDER — FENTANYL CITRATE (PF) 100 MCG/2ML IJ SOLN
INTRAMUSCULAR | Status: AC | PRN
  Administered 2018-11-26: 25 ug via INTRAVENOUS
  Administered 2018-11-26: 50 ug via INTRAVENOUS

## 2018-11-26 NOTE — Discharge Instructions (Signed)
Needle Biopsy, Care After °These instructions tell you how to care for yourself after your procedure. Your doctor may also give you more specific instructions. Call your doctor if you have any problems or questions. °What can I expect after the procedure? °After the procedure, it is common to have: °· Soreness. °· Bruising. °· Mild pain. °Follow these instructions at home: ° °· Return to your normal activities as told by your doctor. Ask your doctor what activities are safe for you. °· Take over-the-counter and prescription medicines only as told by your doctor. °· Wash your hands with soap and water before you change your bandage (dressing). If you cannot use soap and water, use hand sanitizer. °· Follow instructions from your doctor about: °? How to take care of your puncture site. °? When and how to change your bandage. °? When to remove your bandage. °· Check your puncture site every day for signs of infection. Watch for: °? Redness, swelling, or pain. °? Fluid or blood.  °? Pus or a bad smell. °? Warmth. °· Do not take baths, swim, or use a hot tub until your doctor approves. Ask your doctor if you may take showers. You may only be allowed to take sponge baths. °· Keep all follow-up visits as told by your doctor. This is important. °Contact a doctor if you have: °· A fever. °· Redness, swelling, or pain at the puncture site, and it lasts longer than a few days. °· Fluid, blood, or pus coming from the puncture site. °· Warmth coming from the puncture site. °Get help right away if: °· You have a lot of bleeding from the puncture site. °Summary °· After the procedure, it is common to have soreness, bruising, or mild pain at the puncture site. °· Check your puncture site every day for signs of infection, such as redness, swelling, or pain. °· Get help right away if you have severe bleeding from your puncture site. °This information is not intended to replace advice given to you by your health care provider. Make  sure you discuss any questions you have with your health care provider. °Document Released: 09/14/2008 Document Revised: 10/15/2017 Document Reviewed: 10/15/2017 °Elsevier Interactive Patient Education © 2019 [employer] °Moderate Conscious Sedation, Adult, Care After °These instructions provide you with information about caring for yourself after your procedure. Your health care provider may also give you more specific instructions. Your treatment has been planned according to current medical practices, but problems sometimes occur. Call your health care provider if you have any problems or questions after your procedure. °What can I expect after the procedure? °After your procedure, it is common: °· To feel sleepy for several hours. °· To feel clumsy and have poor balance for several hours. °· To have poor judgment for several hours. °· To vomit if you eat too soon. °Follow these instructions at home: °For at least 24 hours after the procedure: ° °· Do not: °? Participate in activities where you could fall or become injured. °? Drive. °? Use heavy machinery. °? Drink alcohol. °? Take sleeping pills or medicines that cause drowsiness. °? Make important decisions or sign legal documents. °? Take care of children on your own. °· Rest. °Eating and drinking °· Follow the diet recommended by your health care provider. °· If you vomit: °? Drink water, juice, or soup when you can drink without vomiting. °? Make sure you have little or no nausea before eating solid foods. °General instructions °· Have a responsible adult stay   with you until you are awake and alert. °· Take over-the-counter and prescription medicines only as told by your health care provider. °· If you smoke, do not smoke without supervision. °· Keep all follow-up visits as told by your health care provider. This is important. °Contact a health care provider if: °· You keep feeling nauseous or you keep vomiting. °· You feel light-headed. °· You develop a  rash. °· You have a fever. °Get help right away if: °· You have trouble breathing. °This information is not intended to replace advice given to you by your health care provider. Make sure you discuss any questions you have with your health care provider. °Document Released: 07/23/2013 Document Revised: 03/06/2016 Document Reviewed: 01/22/2016 °Elsevier Interactive Patient Education © 2019 [employer] ° °

## 2018-11-26 NOTE — H&P (Signed)
Chief Complaint: Endometrial cancer with prominent left inguinal lymph node  Referring Physician(s): Frontenac G  Supervising Physician: Sandi Mariscal  Patient Status: [facility] - Out-pt  History of Present Illness: Jacqueline Yoder is a 73 y.o. female who initially presented to with post menopausal bleeding 08/16/2018.  On 10/29/2018 endometrial biopsy and pap was done. Pathology revealed endometrial adenocarcinoma.  CT scan= 1. Thickened endometrium with some irregularity compatible with the provided diagnosis of endometrial malignancy. There is a mildly prominent left inguinal lymph node at 1.4 cm in short axis which may reflect nodal spread. 2. Bilateral cystic renal lesions. We do not have precontrast images in order to be able to assigned Bosniak classifications. No significant degree of de-enhancement to favor solid mass. 3. Aortic Atherosclerosis (ICD10-I70.0). Coronary atherosclerosis. Sigmoid colon diverticulosis, with several areas of mild colon wall thickening probably from contraction. Correlation with the patient's colon cancer screening history is recommended. If screening is not up-to-date, appropriate screening should be considered. 4. Lumbar impingement at L3-4 and L4-5. 5. Esophageal reflux versus dysmotility.  She is here today for biopsy of the left inguinal lymph node.  She is NPO. She does not take blood thinners.  She reports weight loss of approximately 20-30 pounds over past year and vaginal spotting. She c/o  Constipation. No nausea/vomiting. No Fever/chills. Otherwise ROS negative.   Past Medical History:  Diagnosis Date  . Hypercholesteremia   . Hypertension   . Hypertension   . Urinary incontinence     Past Surgical History:  Procedure Laterality Date  . CHOLECYSTECTOMY  1971    Allergies: Sulfamethoxazole-trimethoprim  Medications: Prior to Admission medications   Medication Sig Start Date End Date Taking? Authorizing Provider    Cholecalciferol (VITAMIN D3) 2000 units capsule Take 2,000 Units by mouth daily.    [provider]  diclofenac sodium (VOLTAREN) 1 % GEL Apply topically as needed. 05/09/18   [provider]  fexofenadine (ALLEGRA) 180 MG tablet Take 180 mg by mouth daily.    [provider]  fluticasone (FLONASE) 50 MCG/ACT nasal spray 50 sprays daily. 05/07/18   [provider]  ketorolac (ACULAR) 0.4 % SOLN Apply 4 drops to eye 2 (two) times daily. 06/26/18   [provider]  Multiple Vitamins-Minerals (ONE-A-DAY WOMENS 50 PLUS PO) Take by mouth.    [provider]  pantoprazole (PROTONIX) 40 MG tablet Take 40 mg by mouth as needed. 12/03/17   [provider]  potassium chloride (K-DUR) 10 MEQ tablet Take 10 mEq by mouth daily. 03/28/18   [provider]  prednisoLONE acetate (PRED FORTE) 1 % ophthalmic suspension 1 drop. 07/23/18   [provider]  simvastatin (ZOCOR) 40 MG tablet Take 40 mg by mouth daily. 08/09/18   [provider]  triamterene-hydrochlorothiazide (DYAZIDE) 37.5-25 MG capsule Take by mouth daily. 10/05/17   [provider]     Family History  Problem Relation Age of Onset  . Cancer Brother   . Breast cancer Neg Hx   . Ovarian cancer Neg Hx   . Colon cancer Neg Hx     Social History   Socioeconomic History  . Marital status: Married    Spouse name: Not on file  . Number of children: Not on file  . Years of education: Not on file  . Highest education level: Not on file  Occupational History  . Not on file  Social Needs  . Financial resource strain: Not on file  . Food insecurity:  Worry: Not on file    Inability: Not on file  . Transportation needs:    Medical: Not on file    Non-medical: Not on file  Tobacco Use  . Smoking status: Former Research scientist (life sciences)  . Smokeless tobacco: Never Used  Substance and Sexual Activity  . Alcohol use: Never    Frequency: Never  . Drug use: Never  .  Sexual activity: Not Currently    Birth control/protection: None  Lifestyle  . Physical activity:    Days per week: Not on file    Minutes per session: Not on file  . Stress: Not on file  Relationships  . Social connections:    Talks on phone: Not on file    Gets together: Not on file    Attends religious service: Not on file    Active member of club or organization: Not on file    Attends meetings of clubs or organizations: Not on file    Relationship status: Not on file  Other Topics Concern  . Not on file  Social History Narrative  . Not on file     Review of Systems: A 12 point ROS discussed and pertinent positives are indicated in the HPI above.  All other systems are negative.  Review of Systems  Vital Signs: BP (!) 142/70   Pulse 96   Temp 98.3 F (36.8 C) (Oral)   Resp (!) 24   Ht 5\' 5"  (1.651 m)   Wt 102.1 kg   SpO2 94%   BMI 37.44 kg/m   Physical Exam Vitals signs reviewed.  Constitutional:      Appearance: Normal appearance.  HENT:     Head: Normocephalic and atraumatic.  Eyes:     Extraocular Movements: Extraocular movements intact.  Neck:     Musculoskeletal: Normal range of motion.  Cardiovascular:     Rate and Rhythm: Normal rate and regular rhythm.  Pulmonary:     Effort: Pulmonary effort is normal.     Breath sounds: Normal breath sounds.  Abdominal:     General: There is no distension.     Palpations: Abdomen is soft.     Tenderness: There is no abdominal tenderness.  Musculoskeletal: Normal range of motion.  Skin:    General: Skin is warm and dry.  Neurological:     General: No focal deficit present.     Mental Status: She is alert and oriented to person, place, and time.  Psychiatric:        Mood and Affect: Mood normal.        Behavior: Behavior normal.        Thought Content: Thought content normal.        Judgment: Judgment normal.     Imaging: Ct Chest W Contrast  Result Date: 11/20/2018 CLINICAL DATA:  Staging of  endometrial cancer EXAM: CT CHEST, ABDOMEN, AND PELVIS WITH CONTRAST TECHNIQUE: Multidetector CT imaging of the chest, abdomen and pelvis was performed following the standard protocol during bolus administration of intravenous contrast. CONTRAST:  174mL ISOVUE-300 IOPAMIDOL (ISOVUE-300) INJECTION 61% COMPARISON:  Chest radiograph 06/14/2016 and ultrasound from 10/29/2018 FINDINGS: CT CHEST FINDINGS Cardiovascular: Atherosclerotic calcification of the thoracic aorta and left anterior descending coronary artery. Faint calcification along the aortic valve. Mediastinum/Nodes: 5 mm left supraclavicular lymph node, not pathologically enlarged. A small amount of contrast medium in the esophagus, possibly from reflux or dysmotility. Lungs/Pleura: Unremarkable Musculoskeletal: Mild thoracic spondylosis. CT ABDOMEN PELVIS FINDINGS Hepatobiliary: Gallbladder not seen, likely reflecting cholecystectomy otherwise unremarkable.  Pancreas: Unremarkable Spleen: Unremarkable Adrenals/Urinary Tract: Bilateral renal cystic lesions are present with slight variation in density between the portal venous and delayed phase images of some of these lesions, most likely incidental. An exophytic right kidney lower pole lesion measuring 2.6 by 2.3 by 2.1 cm is mildly complex but has similar portal venous and delayed phase densities. Adrenal glands normal.  Urinary bladder unremarkable. Stomach/Bowel: Sigmoid colon diverticulosis. Colon wall thickening in the vicinity of the ileocecal valve, and also in the left upper quadrant, most likely incidental or from contraction. Vascular/Lymphatic: Aortoiliac atherosclerotic vascular disease. A left inguinal lymph node measures 1.4 cm in short axis on image 105/2. No other prominent lymph nodes are observed. Reproductive: Mildly irregular endometrium up to 1.6 cm in thickness on image 81/7. Adnexa unremarkable. Other: No supplemental non-categorized findings. Musculoskeletal: Lumbar spondylosis and  degenerative disc disease with suspected impingement at L3-4 and L4-5. IMPRESSION: 1. Thickened endometrium with some irregularity compatible with the provided diagnosis of endometrial malignancy. There is a mildly prominent left inguinal lymph node at 1.4 cm in short axis which may reflect nodal spread. 2. Bilateral cystic renal lesions. We do not have precontrast images in order to be able to assigned Bosniak classifications. No significant degree of de-enhancement to favor solid mass. 3. Aortic Atherosclerosis (ICD10-I70.0). Coronary atherosclerosis. Sigmoid colon diverticulosis, with several areas of mild colon wall thickening probably from contraction. Correlation with the patient's colon cancer screening history is recommended. If screening is not up-to-date, appropriate screening should be considered. 4. Lumbar impingement at L3-4 and L4-5. 5. Esophageal reflux versus dysmotility. Electronically Signed   By: Van Clines M.D.   On: 11/20/2018 08:21   US Transvaginal Non-ob  Result Date: 11/05/2018 Patient Name: Jacqueline Yoder DOB: Nov 15, 1945 MRN: [id number] ULTRASOUND REPORT Location: [facility] Date of Service: 10/29/2018 Indications:PMB Findings: TA AND TV The uterus is anteverted and measures 8.7x5.6x6.4cm Echo texture is homogenous without evidence of focal masses. The Endometrium measures 24.1 mm.(thick) and heterogeneous) Right and left ovaries are not seen Survey of the adnexa demonstrates no adnexal masses. There is no free fluid in the cul de sac. Impression: 1.The Endometrium measures 24.1 mm.(thick) and heterogeneous) Recommendations: 1.Clinical correlation with the patient's History and Physical Exam. Abeer Alsammarraie,RDMS I have reviewed this study and agree with documented findings. I have reviewed this study with the patient today and recommended further workup. Rubie Maid, MD [facility]   Ct Abdomen Pelvis W Contrast  Result Date: 11/20/2018 CLINICAL DATA:   Staging of endometrial cancer EXAM: CT CHEST, ABDOMEN, AND PELVIS WITH CONTRAST TECHNIQUE: Multidetector CT imaging of the chest, abdomen and pelvis was performed following the standard protocol during bolus administration of intravenous contrast. CONTRAST:  123mL ISOVUE-300 IOPAMIDOL (ISOVUE-300) INJECTION 61% COMPARISON:  Chest radiograph 06/14/2016 and ultrasound from 10/29/2018 FINDINGS: CT CHEST FINDINGS Cardiovascular: Atherosclerotic calcification of the thoracic aorta and left anterior descending coronary artery. Faint calcification along the aortic valve. Mediastinum/Nodes: 5 mm left supraclavicular lymph node, not pathologically enlarged. A small amount of contrast medium in the esophagus, possibly from reflux or dysmotility. Lungs/Pleura: Unremarkable Musculoskeletal: Mild thoracic spondylosis. CT ABDOMEN PELVIS FINDINGS Hepatobiliary: Gallbladder not seen, likely reflecting cholecystectomy otherwise unremarkable. Pancreas: Unremarkable Spleen: Unremarkable Adrenals/Urinary Tract: Bilateral renal cystic lesions are present with slight variation in density between the portal venous and delayed phase images of some of these lesions, most likely incidental. An exophytic right kidney lower pole lesion measuring 2.6 by 2.3 by 2.1 cm is mildly complex but has similar portal venous  and delayed phase densities. Adrenal glands normal.  Urinary bladder unremarkable. Stomach/Bowel: Sigmoid colon diverticulosis. Colon wall thickening in the vicinity of the ileocecal valve, and also in the left upper quadrant, most likely incidental or from contraction. Vascular/Lymphatic: Aortoiliac atherosclerotic vascular disease. A left inguinal lymph node measures 1.4 cm in short axis on image 105/2. No other prominent lymph nodes are observed. Reproductive: Mildly irregular endometrium up to 1.6 cm in thickness on image 81/7. Adnexa unremarkable. Other: No supplemental non-categorized findings. Musculoskeletal: Lumbar  spondylosis and degenerative disc disease with suspected impingement at L3-4 and L4-5. IMPRESSION: 1. Thickened endometrium with some irregularity compatible with the provided diagnosis of endometrial malignancy. There is a mildly prominent left inguinal lymph node at 1.4 cm in short axis which may reflect nodal spread. 2. Bilateral cystic renal lesions. We do not have precontrast images in order to be able to assigned Bosniak classifications. No significant degree of de-enhancement to favor solid mass. 3. Aortic Atherosclerosis (ICD10-I70.0). Coronary atherosclerosis. Sigmoid colon diverticulosis, with several areas of mild colon wall thickening probably from contraction. Correlation with the patient's colon cancer screening history is recommended. If screening is not up-to-date, appropriate screening should be considered. 4. Lumbar impingement at L3-4 and L4-5. 5. Esophageal reflux versus dysmotility. Electronically Signed   By: Van Clines M.D.   On: 11/20/2018 08:21    Labs:  CBC: Recent Labs    11/13/18 1422  WBC 5.8  HGB 13.6  HCT 42.4  PLT 280    COAGS: No results for input(s): INR, APTT in the last 8760 hours.  BMP: Recent Labs    11/13/18 1422  CREATININE 1.13*  GFRNONAA 49*  GFRAA 57*    LIVER FUNCTION TESTS: No results for input(s): BILITOT, AST, ALT, ALKPHOS, PROT, ALBUMIN in the last 8760 hours.  TUMOR MARKERS: No results for input(s): AFPTM, CEA, CA199, CHROMGRNA in the last 8760 hours.  Assessment and Plan:  Mildly prominent left inguinal lymph node at 1.4 cm which may reflect nodal spread.  Will proceed with image guided biopsy of left inguinal lymph node today by Dr. Pascal Lux.  Risks and benefits of lymph node biopsy was discussed with the patient and/or patient's family including, but not limited to bleeding, infection, damage to adjacent structures or low yield requiring additional tests.  All of the questions were answered and there is agreement to  proceed.  Consent signed and in chart.  Thank you for this interesting consult.  I greatly enjoyed meeting Jacqueline Yoder and look forward to participating in their care.  A copy of this report was sent to the requesting provider on this date.  Electronically Signed: Murrell Redden, PA-C   11/26/2018, 9:51 AM      I spent a total of  30 Minutes in face to face in clinical consultation, greater than 50% of which was counseling/coordinating care for lymph node biopsy.

## 2018-11-26 NOTE — Procedures (Signed)
Pre Procedure Dx: Endometrial Cancer Post Procedural Dx: Same  Technically successful US guided biopsy of left inguinal lymph node.   EBL: None  No immediate complications.   Ronny Bacon, MD Pager #: [phone]

## 2018-11-27 ENCOUNTER — Other Ambulatory Visit: Payer: Medicare Other

## 2018-11-27 NOTE — Progress Notes (Signed)
Tumor Board Documentation  Jacqueline Yoder was presented by Mariea Clonts, RN at our Tumor Board on 11/27/2018, which included representatives from medical oncology, radiation oncology, surgical oncology, pathology, navigation.  Jacqueline Yoder currently presents as a current patient, for discussion, for new positive pathology with history of the following treatments: none.  Additionally, we reviewed previous medical and familial history, history of present illness, and recent lab results along with all available histopathologic and imaging studies. The tumor board considered available treatment options and made the following recommendations: Neoadjuvant chemotherapy, screen for RUBY trial based on final pathology from lymph node biopsy on 11/26/2018. Will refer to medical-oncology and referral for colonoscopy.     The following procedures/referrals were also placed: No orders of the defined types were placed in this encounter.  Clinical Trial Status: await additional information, potentially eligible   Staging used: To be determined- pending pathology from pelvic lymph node.   National site-specific guidelines NCCN were discussed with respect to the case.  Tumor board is a meeting of clinicians from various specialty areas who evaluate and discuss patients for whom a multidisciplinary approach is being considered. Final determinations in the plan of care are those of the provider(s). The responsibility for follow up of recommendations given during tumor board is that of the provider.   Today's extended care, comprehensive team conference, Jacqueline Yoder was not present for the discussion and was not examined.   Multidisciplinary Tumor Board is a multidisciplinary case peer review process.  Decisions discussed in the Multidisciplinary Tumor Board reflect the opinions of the specialists present at the conference without having examined the patient.  Ultimately, treatment and diagnostic decisions rest with the primary  provider(s) and the patient.

## 2018-11-28 ENCOUNTER — Telehealth: Payer: Self-pay

## 2018-11-28 DIAGNOSIS — K639 Disease of intestine, unspecified: Secondary | ICD-10-CM

## 2018-11-28 DIAGNOSIS — C541 Malignant neoplasm of endometrium: Secondary | ICD-10-CM

## 2018-11-28 NOTE — Telephone Encounter (Signed)
Jacqueline Yoder returned call. We discussed lymph node finding with cancer cells of possible GI origin. IHC is still pending. Her CT scan showed mild colon wall thickening. She states she has never had a colonoscopy before. Per tumor board recommendations we discussed having a colonoscopy. We will arrange this within the next week. We also discussed a consult by medical oncology to discuss chemotherapy. She is agreeable to both and these referrals will be placed today. I briefly spoke to her about the RUBY trial at [facility]. We will have her screened to see if this would be an option if she has stage IV endometrial cancer. All of her questions have been answered and I encouraged her to call with any questions.

## 2018-11-28 NOTE — Telephone Encounter (Signed)
Voicemail left with Ms. Wolbert to return call regarding lymph node biopsy results and tumor board recommendations.

## 2018-11-29 ENCOUNTER — Other Ambulatory Visit: Payer: Self-pay

## 2018-11-29 DIAGNOSIS — R933 Abnormal findings on diagnostic imaging of other parts of digestive tract: Secondary | ICD-10-CM

## 2018-11-29 MED ORDER — NA SULFATE-K SULFATE-MG SULF 17.5-3.13-1.6 GM/177ML PO SOLN: 1.0000 | ORAL | 0 refills | Status: DC

## 2018-12-02 ENCOUNTER — Inpatient Hospital Stay: Payer: Medicare Other

## 2018-12-02 ENCOUNTER — Telehealth: Payer: Self-pay

## 2018-12-02 ENCOUNTER — Other Ambulatory Visit: Payer: Self-pay

## 2018-12-02 ENCOUNTER — Encounter: Payer: Self-pay | Admitting: *Deleted

## 2018-12-02 ENCOUNTER — Inpatient Hospital Stay: Payer: Medicare Other | Admitting: Oncology

## 2018-12-02 ENCOUNTER — Encounter: Payer: Self-pay | Admitting: Oncology

## 2018-12-02 VITALS — BP 131/80 | HR 99 | Temp 97.2°F | Resp 18 | Ht 65.5 in | Wt 217.9 lb

## 2018-12-02 DIAGNOSIS — C541 Malignant neoplasm of endometrium: Secondary | ICD-10-CM | POA: Diagnosis not present

## 2018-12-02 DIAGNOSIS — Z8042 Family history of malignant neoplasm of prostate: Secondary | ICD-10-CM

## 2018-12-02 DIAGNOSIS — Z800 Family history of malignant neoplasm of digestive organs: Secondary | ICD-10-CM

## 2018-12-02 DIAGNOSIS — R590 Localized enlarged lymph nodes: Secondary | ICD-10-CM

## 2018-12-02 DIAGNOSIS — E876 Hypokalemia: Secondary | ICD-10-CM

## 2018-12-02 LAB — COMPREHENSIVE METABOLIC PANEL
ALT: 19 U/L (ref 0–44)
AST: 28 U/L (ref 15–41)
Albumin: 4.2 g/dL (ref 3.5–5.0)
Alkaline Phosphatase: 32 U/L — ABNORMAL LOW (ref 38–126)
Anion gap: 10 (ref 5–15)
BILIRUBIN TOTAL: 0.5 mg/dL (ref 0.3–1.2)
BUN: 11 mg/dL (ref 8–23)
CO2: 26 mmol/L (ref 22–32)
Calcium: 9.4 mg/dL (ref 8.9–10.3)
Chloride: 103 mmol/L (ref 98–111)
Creatinine, Ser: 1.11 mg/dL — ABNORMAL HIGH (ref 0.44–1.00)
GFR calc Af Amer: 58 mL/min — ABNORMAL LOW (ref >60)
GFR calc non Af Amer: 50 mL/min — ABNORMAL LOW (ref >60)
Glucose, Bld: 110 mg/dL — ABNORMAL HIGH (ref 70–99)
Potassium: 3.1 mmol/L — ABNORMAL LOW (ref 3.5–5.1)
Sodium: 139 mmol/L (ref 135–145)
TOTAL PROTEIN: 7.2 g/dL (ref 6.5–8.1)

## 2018-12-02 LAB — CBC WITH DIFFERENTIAL/PLATELET
Abs Immature Granulocytes: 0 10*3/uL (ref 0.00–0.07)
BASOS PCT: 1 %
Basophils Absolute: 0 10*3/uL (ref 0.0–0.1)
EOS ABS: 0.1 10*3/uL (ref 0.0–0.5)
Eosinophils Relative: 1 %
HCT: 40.4 % (ref 36.0–46.0)
Hemoglobin: 13.2 g/dL (ref 12.0–15.0)
Immature Granulocytes: 0 %
Lymphocytes Relative: 40 %
Lymphs Abs: 2 10*3/uL (ref 0.7–4.0)
MCH: 29.8 pg (ref 26.0–34.0)
MCHC: 32.7 g/dL (ref 30.0–36.0)
MCV: 91.2 fL (ref 80.0–100.0)
Monocytes Absolute: 0.4 10*3/uL (ref 0.1–1.0)
Monocytes Relative: 8 %
Neutro Abs: 2.4 10*3/uL (ref 1.7–7.7)
Neutrophils Relative %: 50 %
PLATELETS: 295 10*3/uL (ref 150–400)
RBC: 4.43 MIL/uL (ref 3.87–5.11)
RDW: 13.2 % (ref 11.5–15.5)
WBC: 4.9 10*3/uL (ref 4.0–10.5)
nRBC: 0 % (ref 0.0–0.2)

## 2018-12-02 NOTE — Progress Notes (Signed)
Met with Ms. Clonch, her 2 daughters and son, during and after consult with Dr. Tasia Catchings. She has all of her information for her colonoscopy tomorrow. She will receive her time today. I will follow up with her after luminal exam. Educated further of reason for tumor markers testing and their role. She has no further questions at this time. Encouraged her to call if she has any needs.

## 2018-12-02 NOTE — Progress Notes (Signed)
Patient here for initial visit. °

## 2018-12-02 NOTE — Telephone Encounter (Signed)
Dr. Theora Gianotti and Dr. Fransisca Connors were notified 11/29/18 by Beckey Rutter, Np with final lymph node pathology. She is scheduled to see Dr. Tasia Catchings today for medical oncology consult and is scheduled for colonoscopy 2/18.

## 2018-12-03 ENCOUNTER — Ambulatory Visit
Admission: RE | Admit: 2018-12-03 | Discharge: 2018-12-03 | Disposition: A | Discharge status: Home / Self Care | Payer: Medicare Other | Attending: Gastroenterology | Admitting: Gastroenterology

## 2018-12-03 ENCOUNTER — Ambulatory Visit: Payer: Medicare Other | Admitting: Anesthesiology

## 2018-12-03 ENCOUNTER — Ambulatory Visit: Payer: Medicare Other | Admitting: Gastroenterology

## 2018-12-03 ENCOUNTER — Encounter
Admission: RE | Disposition: A | Discharge status: Home / Self Care | Payer: Self-pay | Source: Home / Self Care | Attending: Gastroenterology

## 2018-12-03 ENCOUNTER — Encounter: Payer: Self-pay | Admitting: Anesthesiology

## 2018-12-03 ENCOUNTER — Other Ambulatory Visit: Payer: Self-pay

## 2018-12-03 DIAGNOSIS — K632 Fistula of intestine: Secondary | ICD-10-CM | POA: Insufficient documentation

## 2018-12-03 DIAGNOSIS — Z87891 Personal history of nicotine dependence: Secondary | ICD-10-CM | POA: Insufficient documentation

## 2018-12-03 DIAGNOSIS — E7800 Pure hypercholesterolemia, unspecified: Secondary | ICD-10-CM | POA: Insufficient documentation

## 2018-12-03 DIAGNOSIS — R933 Abnormal findings on diagnostic imaging of other parts of digestive tract: Secondary | ICD-10-CM | POA: Diagnosis present

## 2018-12-03 DIAGNOSIS — Z7952 Long term (current) use of systemic steroids: Secondary | ICD-10-CM | POA: Diagnosis not present

## 2018-12-03 DIAGNOSIS — Z79899 Other long term (current) drug therapy: Secondary | ICD-10-CM | POA: Diagnosis not present

## 2018-12-03 DIAGNOSIS — K621 Rectal polyp: Secondary | ICD-10-CM | POA: Insufficient documentation

## 2018-12-03 DIAGNOSIS — I10 Essential (primary) hypertension: Secondary | ICD-10-CM | POA: Diagnosis not present

## 2018-12-03 DIAGNOSIS — Z7951 Long term (current) use of inhaled steroids: Secondary | ICD-10-CM | POA: Insufficient documentation

## 2018-12-03 DIAGNOSIS — D490 Neoplasm of unspecified behavior of digestive system: Secondary | ICD-10-CM

## 2018-12-03 DIAGNOSIS — Z791 Long term (current) use of non-steroidal anti-inflammatories (NSAID): Secondary | ICD-10-CM | POA: Insufficient documentation

## 2018-12-03 DIAGNOSIS — Z882 Allergy status to sulfonamides status: Secondary | ICD-10-CM | POA: Diagnosis not present

## 2018-12-03 HISTORY — PX: COLONOSCOPY WITH PROPOFOL: SHX5780

## 2018-12-03 LAB — CA 125: Cancer Antigen (CA) 125: 17 U/mL (ref 0.0–38.1)

## 2018-12-03 LAB — CEA: CEA: 388 ng/mL — ABNORMAL HIGH (ref 0.0–4.7)

## 2018-12-03 SURGERY — COLONOSCOPY WITH PROPOFOL
Anesthesia: General

## 2018-12-03 MED ORDER — LIDOCAINE HCL (PF) 2 % IJ SOLN
INTRAMUSCULAR | Status: AC
  Filled 2018-12-03: qty 10

## 2018-12-03 MED ORDER — SODIUM CHLORIDE 0.9 % IV SOLN
INTRAVENOUS | Status: DC
  Administered 2018-12-03: 10:00:00 via INTRAVENOUS

## 2018-12-03 MED ORDER — LIDOCAINE HCL (CARDIAC) PF 100 MG/5ML IV SOSY
PREFILLED_SYRINGE | INTRAVENOUS | Status: DC | PRN
  Administered 2018-12-03: 50 mg via INTRAVENOUS

## 2018-12-03 MED ORDER — PROPOFOL 500 MG/50ML IV EMUL
INTRAVENOUS | Status: DC | PRN
  Administered 2018-12-03: 130 ug/kg/min via INTRAVENOUS

## 2018-12-03 MED ORDER — PROPOFOL 500 MG/50ML IV EMUL
INTRAVENOUS | Status: AC
  Filled 2018-12-03: qty 50

## 2018-12-03 MED ORDER — PROPOFOL 10 MG/ML IV BOLUS
INTRAVENOUS | Status: DC | PRN
  Administered 2018-12-03: 80 mg via INTRAVENOUS

## 2018-12-03 NOTE — Anesthesia Postprocedure Evaluation (Signed)
Anesthesia Post Note  Patient: Jacqueline Yoder  Procedure(s) Performed: COLONOSCOPY WITH PROPOFOL (N/A )  Patient location during evaluation: Endoscopy Anesthesia Type: General Level of consciousness: awake and alert Pain management: pain level controlled Vital Signs Assessment: post-procedure vital signs reviewed and stable Respiratory status: spontaneous breathing, nonlabored ventilation, respiratory function stable and patient connected to nasal cannula oxygen Cardiovascular status: blood pressure returned to baseline and stable Postop Assessment: no apparent nausea or vomiting Anesthetic complications: no     Last Vitals:  Vitals:   12/03/18 1135 12/03/18 1145  BP: 129/77   Pulse: 77   Resp: 15 16  Temp:    SpO2: 99%     Last Pain:  Vitals:   12/03/18 1145  TempSrc:   PainSc: [place]

## 2018-12-03 NOTE — Transfer of Care (Signed)
Immediate Anesthesia Transfer of Care Note  Patient: Jacqueline Yoder  Procedure(s) Performed: COLONOSCOPY WITH PROPOFOL (N/A )  Patient Location: PACU and Endoscopy Unit  Anesthesia Type:General  Level of Consciousness: drowsy  Airway & Oxygen Therapy: Patient Spontanous Breathing  Post-op Assessment: Report given to RN and Post -op Vital signs reviewed and stable  Post vital signs: Reviewed and stable  Last Vitals:  Vitals Value Taken Time  BP    Temp    Pulse    Resp    SpO2      Last Pain:  Vitals:   12/03/18 1005  TempSrc: Tympanic  PainSc: 4          Complications: No apparent anesthesia complications

## 2018-12-03 NOTE — Op Note (Signed)
[facility] Gastroenterology Patient Name: Jacqueline Yoder Procedure Date: 12/03/2018 10:57 AM MRN: [id number] Account #: [id number] Date of Birth: 1946/07/07 Admit Type: Outpatient Age: 73 Room: [facility] ENDO ROOM 4 Gender: Female Note Status: Finalized Procedure:            Colonoscopy Indications:          Abnormal CT of the GI tract Providers:            Lucilla Lame MD, MD Referring MD:         Ocie Cornfield. Ouida Sills MD, MD (Referring MD) Medicines:            Propofol per Anesthesia Complications:        No immediate complications. Procedure:            Pre-Anesthesia Assessment:                       - Prior to the procedure, a History and Physical was                        performed, and patient medications and allergies were                        reviewed. The patient's tolerance of previous                        anesthesia was also reviewed. The risks and benefits of                        the procedure and the sedation options and risks were                        discussed with the patient. All questions were                        answered, and informed consent was obtained. Prior                        Anticoagulants: The patient has taken no previous                        anticoagulant or antiplatelet agents. ASA Grade                        Assessment: II - A patient with mild systemic disease.                        After reviewing the risks and benefits, the patient was                        deemed in satisfactory condition to undergo the                        procedure.                       After obtaining informed consent, the colonoscope was                        passed under direct vision. Throughout the procedure,  the patient's blood pressure, pulse, and oxygen                        saturations were monitored continuously. The                        Colonoscope was introduced through the anus and                         advanced to the the cecum, identified by appendiceal                        orifice and ileocecal valve. The colonoscopy was                        performed without difficulty. The patient tolerated the                        procedure well. The quality of the bowel preparation                        was good. Findings:      The perianal and digital rectal examinations were normal.      A fistula was found in the rectum. This was biopsied with a cold forceps       for histology.      A non-obstructing large mass was found in the rectum. The mass was       non-circumferential. This was biopsied with a cold forceps for histology. Impression:           - Colonic fistula.                       - Tumor in the rectum. Biopsied. Recommendation:       - Discharge patient to home.                       - Resume previous diet.                       - Continue present medications.                       - Await pathology results. Procedure Code(s):    --- Professional ---                       [phone], Colonoscopy, flexible; with biopsy, single or                        multiple Diagnosis Code(s):    --- Professional ---                       R93.3, Abnormal findings on diagnostic imaging of other                        parts of digestive tract                       D49.0, Neoplasm of unspecified behavior of digestive                        system  K63.2, Fistula of intestine CPT copyright 2018 American Medical Association. All rights reserved. The codes documented in this report are preliminary and upon coder review may  be revised to meet current compliance requirements. Lucilla Lame MD, MD 12/03/2018 11:13:32 AM This report has been signed electronically. Number of Addenda: 0 Note Initiated On: 12/03/2018 10:57 AM Scope Withdrawal Time: 0 hours 10 minutes 3 seconds  Total Procedure Duration: 0 hours 12 minutes 26 seconds       [facility]

## 2018-12-03 NOTE — Anesthesia Preprocedure Evaluation (Signed)
Anesthesia Evaluation  Patient identified by MRN, date of birth, ID band Patient awake    Reviewed: Allergy & Precautions, NPO status , Patient's Chart, lab work & pertinent test results, reviewed documented beta blocker date and time   Airway Mallampati: III  TM Distance: >3 FB     Dental  (+) Chipped   Pulmonary former smoker,           Cardiovascular hypertension, Pt. on medications      Neuro/Psych    GI/Hepatic   Endo/Other    Renal/GU Renal disease     Musculoskeletal   Abdominal   Peds  Hematology   Anesthesia Other Findings Low sats 94-96. K 3.1.  Reproductive/Obstetrics                             Anesthesia Physical Anesthesia Plan  ASA: III  Anesthesia Plan: General   Post-op Pain Management:    Induction: Intravenous  PONV Risk Score and Plan:   Airway Management Planned:   Additional Equipment:   Intra-op Plan:   Post-operative Plan:   Informed Consent: I have reviewed the patients History and Physical, chart, labs and discussed the procedure including the risks, benefits and alternatives for the proposed anesthesia with the patient or authorized representative who has indicated his/her understanding and acceptance.       Plan Discussed with: CRNA  Anesthesia Plan Comments:         Anesthesia Quick Evaluation

## 2018-12-03 NOTE — Anesthesia Post-op Follow-up Note (Signed)
Anesthesia QCDR form completed.        

## 2018-12-03 NOTE — H&P (Signed)
Lucilla Lame, MD [address] [place], [place] [address] [phone] Fax : [phone]  Primary Care Physician:  Kirk Ruths, MD Primary Gastroenterologist:  Dr. Allen Norris  Pre-Procedure History & Physical: HPI:  Jacqueline Yoder is a 73 y.o. female is here for an colonoscopy.   Past Medical History:  Diagnosis Date  . Hypercholesteremia   . Hypertension   . Hypertension   . Urinary incontinence     Past Surgical History:  Procedure Laterality Date  . CHOLECYSTECTOMY  1971    Prior to Admission medications   Medication Sig Start Date End Date Taking? Authorizing Provider  Cholecalciferol (VITAMIN D3) 2000 units capsule Take 2,000 Units by mouth daily.   Yes [provider]  diclofenac sodium (VOLTAREN) 1 % GEL Apply topically as needed. 05/09/18  Yes [provider]  fexofenadine (ALLEGRA) 180 MG tablet Take 180 mg by mouth daily.   Yes [provider]  fluticasone (FLONASE) 50 MCG/ACT nasal spray 50 sprays daily. 05/07/18  Yes [provider]  ketorolac (ACULAR) 0.4 % SOLN Apply 4 drops to eye 2 (two) times daily. 06/26/18  Yes [provider]  Multiple Vitamins-Minerals (ONE-A-DAY WOMENS 50 PLUS PO) Take by mouth.   Yes [provider]  pantoprazole (PROTONIX) 40 MG tablet Take 40 mg by mouth as needed. 12/03/17  Yes [provider]  potassium chloride (K-DUR) 10 MEQ tablet Take 10 mEq by mouth daily. 03/28/18  Yes [provider]  prednisoLONE acetate (PRED FORTE) 1 % ophthalmic suspension 1 drop. 07/23/18  Yes [provider]  simvastatin (ZOCOR) 40 MG tablet Take 40 mg by mouth daily. 08/09/18  Yes [provider]  triamterene-hydrochlorothiazide (DYAZIDE) 37.5-25 MG capsule Take by mouth daily. 10/05/17  Yes [provider]  Na Sulfate-K Sulfate-Mg Sulf (SUPREP BOWEL PREP KIT) 17.5-3.13-1.6 GM/177ML SOLN Take 1 kit by mouth as directed. Patient not  taking: Reported on 12/02/2018 11/29/18   Lucilla Lame, MD    Allergies as of 11/29/2018 - Review Complete 11/26/2018  Allergen Reaction Noted  . Sulfamethoxazole-trimethoprim Other (See Comments) 02/12/2015    Family History  Problem Relation Age of Onset  . Colon cancer Brother   . Prostate cancer Brother   . Hypertension Mother   . Kidney failure Father   . Breast cancer Neg Hx   . Ovarian cancer Neg Hx     Social History   Socioeconomic History  . Marital status: Married    Spouse name: Not on file  . Number of children: Not on file  . Years of education: Not on file  . Highest education level: Not on file  Occupational History  . Not on file  Social Needs  . Financial resource strain: Not on file  . Food insecurity:    Worry: Not on file    Inability: Not on file  . Transportation needs:    Medical: Not on file    Non-medical: Not on file  Tobacco Use  . Smoking status: Former Smoker    Last attempt to quit: 12/02/1977    Years since quitting: 41.0  . Smokeless tobacco: Never Used  Substance and Sexual Activity  . Alcohol use: Never    Frequency: Never  . Drug use: Never  . Sexual activity: Not Currently    Birth control/protection: None  Lifestyle  . Physical activity:    Days per week: Not on file    Minutes per session: Not on file  . Stress: Not on  file  Relationships  . Social connections:    Talks on phone: Not on file    Gets together: Not on file    Attends religious service: Not on file    Active member of club or organization: Not on file    Attends meetings of clubs or organizations: Not on file    Relationship status: Not on file  . Intimate partner violence:    Fear of current or ex partner: Not on file    Emotionally abused: Not on file    Physically abused: Not on file    Forced sexual activity: Not on file  Other Topics Concern  . Not on file  Social History Narrative  . Not on file    Review of Systems: See HPI, otherwise  negative ROS  Physical Exam: BP (!) 141/78   Pulse (!) 106   Temp (!) 97.4 F (36.3 C) (Tympanic)   Resp 18   Ht _0  (1.651 m)   Wt 99.3 kg   SpO2 100%   BMI 36.44 kg/m  General:   Alert,  pleasant and cooperative in NAD Head:  Normocephalic and atraumatic. Neck:  Supple; no masses or thyromegaly. Lungs:  Clear throughout to auscultation.    Heart:  Regular rate and rhythm. Abdomen:  Soft, nontender and nondistended. Normal bowel sounds, without guarding, and without rebound.   Neurologic:  Alert and  oriented x4;  grossly normal neurologically.  Impression/Plan: Jacqueline Yoder is here for an colonoscopy to be performed for abnormal CT scan  Risks, benefits, limitations, and alternatives regarding  colonoscopy have been reviewed with the patient.  Questions have been answered.  All parties agreeable.   Lucilla Lame, MD  12/03/2018, 11:15 AM

## 2018-12-04 ENCOUNTER — Encounter: Payer: Self-pay | Admitting: Gastroenterology

## 2018-12-04 ENCOUNTER — Telehealth: Payer: Self-pay

## 2018-12-04 ENCOUNTER — Other Ambulatory Visit: Payer: Self-pay

## 2018-12-04 DIAGNOSIS — K6289 Other specified diseases of anus and rectum: Secondary | ICD-10-CM

## 2018-12-04 LAB — SURGICAL PATHOLOGY

## 2018-12-04 NOTE — Progress Notes (Signed)
Hematology/Oncology Consult note [facility] [phone] [phone]   Patient Care Team: Kirk Ruths, MD as PCP - General (Internal Medicine) Clent Jacks, RN as Registered Nurse  REFERRING PROVIDER: Dr.Secord CHIEF COMPLAINTS/REASON FOR VISIT:  Evaluation of endometrial cancer  HISTORY OF PRESENTING ILLNESS:  Jacqueline Yoder is a  73 y.o.  female with PMH listed below who was referred to me for evaluation of endometrial cancer.   Patient initially presented with complaints of postmenopausal bleeding on 08/16/2018.  History of was menopausal vaginal bleeding in 2016 which resulted in cervical polypectomy.  Pathology 02/04/2015 showed cervical polyp, consistent with benign endometrial polyp.  [name] after polypectomy due to anxiety associated with pelvic exams.  pelvic exam on 08/16/2018 reviewed cervical abnormality and from enlarged uterus. Seen by Dr. Marcelline Mates on 10/29/2018.  Endometrial biopsy and a Pap smear was performed. 10/29/2018 Pap smear showed adenocarcinoma, favor endometrial origin. 10/29/2018 endometrial biopsy showed endometrioid carcinoma, FIGO grade 1.  10/29/2018- TA & TV Ultrasound revealed: Anteverted uterus measuring 8.7 x 5.6 x 6.4 cm without evidence of focal masses.  The endometrium measuring 24.1 mm (thickened) and heterogeneous.  Right and left ovaries not visualized.  No adnexal masses identified.  No free fluid in cul-de-sac.  Patient was seen by Dr. Theora Gianotti in clinic on 11/13/2018.  Cervical exam reveals 2 cm exophytic irregular mass consistent with malignancy.   11/19/2018 CT chest abdomen pelvis with contrast showed thickened endometrium with some irregularity compatible with the provided diagnosis of endometrial malignancy.  There is a mildly prominent left inguinal node 1.4 cm.  Patient was seen by Dr. Fransisca Connors on 11/20/2018 and left groin lymph node biopsy was recommended.  11/26/2018 patient  underwent left inguinal lymph node biopsy. Pathology showed metastatic adenocarcinoma consistent with colorectal origin.  CDX 2+.  Case was discussed on tumor board.  Recommend colonoscopy for further evaluation.  Patient reports significant weight loss 30 pounds over the last year.  Chronic vaginal spotting. Change of bowel habits the past few months.  More constipated.  Family history positive for brother positive for colon cancer prostate cancer.  Review of Systems  Constitutional: Positive for fatigue and unexpected weight change. Negative for appetite change, chills and fever.  HENT:   Negative for hearing loss and voice change.   Eyes: Negative for eye problems.  Respiratory: Negative for chest tightness and cough.   Cardiovascular: Negative for chest pain.  Gastrointestinal: Positive for constipation. Negative for abdominal distention, abdominal pain and blood in stool.  Endocrine: Negative for hot flashes.  Genitourinary: Positive for vaginal bleeding. Negative for difficulty urinating and frequency.   Musculoskeletal: Negative for arthralgias.  Skin: Negative for itching and rash.  Neurological: Negative for extremity weakness.  Hematological: Negative for adenopathy.  Psychiatric/Behavioral: Negative for confusion.    MEDICAL HISTORY:  Past Medical History:  Diagnosis Date  . Hypercholesteremia   . Hypertension   . Hypertension   . Urinary incontinence     SURGICAL HISTORY: Past Surgical History:  Procedure Laterality Date  . CHOLECYSTECTOMY  1971  . COLONOSCOPY WITH PROPOFOL N/A 12/03/2018   Procedure: COLONOSCOPY WITH PROPOFOL;  Surgeon: Lucilla Lame, MD;  Location: [facility] ENDOSCOPY;  Service: Endoscopy;  Laterality: N/A;    SOCIAL HISTORY: Social History   Socioeconomic History  . Marital status: Married    Spouse name: Not on file  . Number of children: Not on file  . Years of education: Not on file  . Highest education level: Not  on file  Occupational  History  . Not on file  Social Needs  . Financial resource strain: Not on file  . Food insecurity:    Worry: Not on file    Inability: Not on file  . Transportation needs:    Medical: Not on file    Non-medical: Not on file  Tobacco Use  . Smoking status: Former Smoker    Last attempt to quit: 12/02/1977    Years since quitting: 41.0  . Smokeless tobacco: Never Used  Substance and Sexual Activity  . Alcohol use: Never    Frequency: Never  . Drug use: Never  . Sexual activity: Not Currently    Birth control/protection: None  Lifestyle  . Physical activity:    Days per week: Not on file    Minutes per session: Not on file  . Stress: Not on file  Relationships  . Social connections:    Talks on phone: Not on file    Gets together: Not on file    Attends religious service: Not on file    Active member of club or organization: Not on file    Attends meetings of clubs or organizations: Not on file    Relationship status: Not on file  . Intimate partner violence:    Fear of current or ex partner: Not on file    Emotionally abused: Not on file    Physically abused: Not on file    Forced sexual activity: Not on file  Other Topics Concern  . Not on file  Social History Narrative  . Not on file    FAMILY HISTORY: Family History  Problem Relation Age of Onset  . Colon cancer Brother   . Prostate cancer Brother   . Hypertension Mother   . Kidney failure Father   . Breast cancer Neg Hx   . Ovarian cancer Neg Hx     ALLERGIES:  is allergic to sulfamethoxazole-trimethoprim.  MEDICATIONS:  Current Outpatient Medications  Medication Sig Dispense Refill  . Cholecalciferol (VITAMIN D3) 2000 units capsule Take 2,000 Units by mouth daily.    . diclofenac sodium (VOLTAREN) 1 % GEL Apply topically as needed.  11  . fexofenadine (ALLEGRA) 180 MG tablet Take 180 mg by mouth daily.    . fluticasone (FLONASE) 50 MCG/ACT nasal spray 50 sprays daily.    Marland Kitchen ketorolac (ACULAR) 0.4 %  SOLN Apply 4 drops to eye 2 (two) times daily.    . Multiple Vitamins-Minerals (ONE-A-DAY WOMENS 50 PLUS PO) Take by mouth.    . pantoprazole (PROTONIX) 40 MG tablet Take 40 mg by mouth as needed.    . potassium chloride (K-DUR) 10 MEQ tablet Take 10 mEq by mouth daily.    . prednisoLONE acetate (PRED FORTE) 1 % ophthalmic suspension 1 drop.  1  . simvastatin (ZOCOR) 40 MG tablet Take 40 mg by mouth daily.    Marland Kitchen triamterene-hydrochlorothiazide (DYAZIDE) 37.5-25 MG capsule Take by mouth daily.    . Na Sulfate-K Sulfate-Mg Sulf (SUPREP BOWEL PREP KIT) 17.5-3.13-1.6 GM/177ML SOLN Take 1 kit by mouth as directed. (Patient not taking: Reported on 12/02/2018) 1 Bottle 0   No current facility-administered medications for this visit.      PHYSICAL EXAMINATION: ECOG PERFORMANCE STATUS: 1 - Symptomatic but completely ambulatory Vitals:   12/02/18 1055  BP: 131/80  Pulse: 99  Resp: 18  Temp: (!) 97.2 F (36.2 C)  SpO2: 96%   Filed Weights   12/02/18 1055  Weight: 217 lb 14.4  oz (98.8 kg)    Physical Exam Constitutional:      General: She is not in acute distress. HENT:     Head: Normocephalic and atraumatic.  Eyes:     General: No scleral icterus.    Pupils: Pupils are equal, round, and reactive to light.  Neck:     Musculoskeletal: Normal range of motion and neck supple.  Cardiovascular:     Rate and Rhythm: Normal rate and regular rhythm.     Heart sounds: Normal heart sounds.  Pulmonary:     Effort: Pulmonary effort is normal. No respiratory distress.     Breath sounds: No wheezing.  Abdominal:     General: Bowel sounds are normal. There is no distension.     Palpations: Abdomen is soft. There is no mass.     Tenderness: There is no abdominal tenderness.  Musculoskeletal: Normal range of motion.        General: No deformity.  Skin:    General: Skin is warm and dry.     Findings: No erythema or rash.  Neurological:     Mental Status: She is alert and oriented to person,  place, and time.     Cranial Nerves: No cranial nerve deficit.     Coordination: Coordination normal.  Psychiatric:        Behavior: Behavior normal.        Thought Content: Thought content normal.     RADIOGRAPHIC STUDIES: I have personally reviewed the radiological images as listed and agreed with the findings in the report.  CMP Latest Ref Rng & Units 12/02/2018  Glucose 70 - 99 mg/dL 110(H)  BUN 8 - 23 mg/dL 11  Creatinine 0.44 - 1.00 mg/dL 1.11(H)  Sodium 135 - 145 mmol/L 139  Potassium 3.5 - 5.1 mmol/L 3.1(L)  Chloride 98 - 111 mmol/L 103  CO2 22 - 32 mmol/L 26  Calcium 8.9 - 10.3 mg/dL 9.4  Total Protein 6.5 - 8.1 g/dL 7.2  Total Bilirubin 0.3 - 1.2 mg/dL 0.5  Alkaline Phos 38 - 126 U/L 32(L)  AST 15 - 41 U/L 28  ALT 0 - 44 U/L 19   CBC Latest Ref Rng & Units 12/02/2018  WBC 4.0 - 10.5 K/uL 4.9  Hemoglobin 12.0 - 15.0 g/dL 13.2  Hematocrit 36.0 - 46.0 % 40.4  Platelets 150 - 400 K/uL 295    LABORATORY DATA:  I have reviewed the data as listed Lab Results  Component Value Date   WBC 4.9 12/02/2018   HGB 13.2 12/02/2018   HCT 40.4 12/02/2018   MCV 91.2 12/02/2018   PLT 295 12/02/2018   Recent Labs    11/13/18 1422 12/02/18 1139  NA  --  139  K  --  3.1*  CL  --  103  CO2  --  26  GLUCOSE  --  110*  BUN  --  11  CREATININE 1.13* 1.11*  CALCIUM  --  9.4  GFRNONAA 49* 50*  GFRAA 57* 58*  PROT  --  7.2  ALBUMIN  --  4.2  AST  --  28  ALT  --  19  ALKPHOS  --  32*  BILITOT  --  0.5   Iron/TIBC/Ferritin/ %Sat No results found for: IRON, TIBC, FERRITIN, IRONPCTSAT   RADIOGRAPHIC STUDIES: I have personally reviewed the radiological images as listed and agreed with the findings in the report. #11/19/2018 CT chest abdomen pelvis chest 1. Thickened endometrium with some irregularity compatible with the provided  diagnosis of endometrial malignancy. There is a mildly prominent left inguinal lymph node at 1.4 cm in short axis which may reflect nodal spread.  2. Bilateral cystic renal lesions. We do not have precontrast images in order to be able to assigned Bosniak classifications. No significant degree of de-enhancement to favor solid mass. 3. Aortic Atherosclerosis (ICD10-I70.0). Coronary atherosclerosis. Sigmoid colon diverticulosis, with several areas of mild colon wall thickening probably from contraction. Correlation with the patient's colon cancer screening history is recommended. If screening is not up-to-date, appropriate screening should be considered. 4. Lumbar impingement at L3-4 and L4-5.5. Esophageal reflux versus dysmotility.  ASSESSMENT & PLAN:  1. Endometrial cancer determined by uterine biopsy ([facility])   2. Lymphadenopathy, inguinal   3. Family history of colon cancer   4. Family history of prostate cancer   5. Hypokalemia    Image findings, pathology findings were discussed in detail with patient. Clinically patient has stage II endometrial cancer. Left inguinal lymph node biopsy is positive for adenocarcinoma, favoring colorectal origin. I agree with proceeding with colonoscopy for further evaluation. Check CA1 25, CEA Discussed with patient that further management pending her colonoscopy findings. If she does have 2 primary malignancies confirmed, treatment for colorectal cancer may take priority.  #Noticed family history of colon cancer, prostate cancer in brother, she has personal history of endometrial cancer and pending further work-up for possible colorectal malignancy.  We will need to discuss with her about genetic testing.  Add MSI testing to left inguinal lymph node biopsy.  #Addendum patient had colonoscopy done on 12/03/2018 revealed a large nonobstructing rectal mass, biopsied awaiting pathology results.  There is also a fistula in the rectum as well.  Biopsied.  CEA is elevated 388 CA125.  Hypokalemia, patient is taking potassium chloride 10 mEq daily.  Orders Placed This Encounter  Procedures  . CBC with  Differential/Platelet    Standing Status:   Future    Number of Occurrences:   1    Standing Expiration Date:   12/03/2019  . Comprehensive metabolic panel    Standing Status:   Future    Number of Occurrences:   1    Standing Expiration Date:   12/03/2019  . CA 125    Standing Status:   Future    Number of Occurrences:   1    Standing Expiration Date:   12/03/2019  . CEA    Standing Status:   Future    Number of Occurrences:   1    Standing Expiration Date:   12/03/2019    All questions were answered. The patient knows to call the clinic with any problems questions or concerns. Cc Dr.Secord, Dr.ANderson  Return of visit: To be determined. Thank you for this kind referral and the opportunity to participate in the care of this patient. A copy of today's note is routed to referring provider  Total face to face encounter time for this patient visit was 60 min. >50% of the time was  spent in counseling and coordination of care.    Earlie Server, MD, PhD Hematology Oncology [facility] at [facility] [place] Pager- [phone]

## 2018-12-04 NOTE — Telephone Encounter (Signed)
Called and spoke with Jacqueline Yoder. She was notified of negative pathology from rectal biopsy. Dr. Allen Norris office is calling her to arrange for repeat biopsy this Friday. I will send pap and endometrial biopsy collected 10/29/18 to [facility] for review at the request of Dr. Fransisca Connors and Dr. Tasia Catchings. Oncology Nurse Navigator Documentation  Navigator Location: [facility] (12/04/18 1700)   )Navigator Encounter Type: Telephone (12/04/18 1700) Telephone: Lahoma Crocker Call (12/04/18 1700)                                                  Time Spent with Patient: 15 (12/04/18 1700)

## 2018-12-04 NOTE — Telephone Encounter (Signed)
Spoke with Dr. Tasia Catchings regarding colonoscopy findings. Once pathology has resulted, if this confirms rectal cancer, we will proceed with rectal cancer staging, then bring her in for treatment planning. I have called and updated Jacqueline Yoder with the plan of care. She verbalized understanding. I have also sent for MMR on left inguinal lymph node biopsy. [phone].

## 2018-12-05 ENCOUNTER — Other Ambulatory Visit: Payer: Self-pay

## 2018-12-05 ENCOUNTER — Telehealth: Payer: Self-pay

## 2018-12-05 ENCOUNTER — Other Ambulatory Visit (HOSPITAL_COMMUNITY)
Admission: RE | Admit: 2018-12-05 | Discharge: 2018-12-05 | Disposition: A | Discharge status: Home / Self Care | Payer: Medicare Other | Source: Ambulatory Visit | Attending: Obstetrics and Gynecology | Admitting: Obstetrics and Gynecology

## 2018-12-05 DIAGNOSIS — K6289 Other specified diseases of anus and rectum: Secondary | ICD-10-CM | POA: Diagnosis present

## 2018-12-05 NOTE — Telephone Encounter (Signed)
Request sent to [facility]ology to send the following slides/blocks to [facility] for review.  [id number] endometrium, biopsy [id number] CervicoVaginal pap  Received faxed confirmation of receipt.

## 2018-12-06 ENCOUNTER — Other Ambulatory Visit: Payer: Self-pay

## 2018-12-06 ENCOUNTER — Ambulatory Visit
Admission: RE | Admit: 2018-12-06 | Discharge: 2018-12-06 | Disposition: A | Discharge status: Home / Self Care | Payer: Medicare Other | Attending: Gastroenterology | Admitting: Gastroenterology

## 2018-12-06 ENCOUNTER — Ambulatory Visit: Payer: Medicare Other | Admitting: Registered Nurse

## 2018-12-06 ENCOUNTER — Encounter
Admission: RE | Disposition: A | Discharge status: Home / Self Care | Payer: Self-pay | Source: Home / Self Care | Attending: Gastroenterology

## 2018-12-06 DIAGNOSIS — I10 Essential (primary) hypertension: Secondary | ICD-10-CM | POA: Diagnosis not present

## 2018-12-06 DIAGNOSIS — K6289 Other specified diseases of anus and rectum: Secondary | ICD-10-CM

## 2018-12-06 DIAGNOSIS — E7800 Pure hypercholesterolemia, unspecified: Secondary | ICD-10-CM | POA: Insufficient documentation

## 2018-12-06 DIAGNOSIS — Z79899 Other long term (current) drug therapy: Secondary | ICD-10-CM | POA: Insufficient documentation

## 2018-12-06 DIAGNOSIS — K632 Fistula of intestine: Secondary | ICD-10-CM | POA: Diagnosis not present

## 2018-12-06 DIAGNOSIS — Z7951 Long term (current) use of inhaled steroids: Secondary | ICD-10-CM | POA: Insufficient documentation

## 2018-12-06 DIAGNOSIS — C19 Malignant neoplasm of rectosigmoid junction: Secondary | ICD-10-CM | POA: Diagnosis not present

## 2018-12-06 DIAGNOSIS — Z87891 Personal history of nicotine dependence: Secondary | ICD-10-CM | POA: Diagnosis not present

## 2018-12-06 DIAGNOSIS — Z800 Family history of malignant neoplasm of digestive organs: Secondary | ICD-10-CM | POA: Insufficient documentation

## 2018-12-06 DIAGNOSIS — Z882 Allergy status to sulfonamides status: Secondary | ICD-10-CM | POA: Diagnosis not present

## 2018-12-06 HISTORY — PX: FLEXIBLE SIGMOIDOSCOPY: SHX5431

## 2018-12-06 SURGERY — SIGMOIDOSCOPY, FLEXIBLE
Anesthesia: General

## 2018-12-06 MED ORDER — SODIUM CHLORIDE 0.9 % IV SOLN
INTRAVENOUS | Status: DC
  Administered 2018-12-06: 11:00:00 via INTRAVENOUS

## 2018-12-06 MED ORDER — PROPOFOL 10 MG/ML IV BOLUS
INTRAVENOUS | Status: DC | PRN
  Administered 2018-12-06: 20 mg via INTRAVENOUS
  Administered 2018-12-06: 10 mg via INTRAVENOUS
  Administered 2018-12-06: 50 mg via INTRAVENOUS
  Administered 2018-12-06: 20 mg via INTRAVENOUS
  Administered 2018-12-06 (×2): 10 mg via INTRAVENOUS

## 2018-12-06 NOTE — Anesthesia Preprocedure Evaluation (Signed)
Anesthesia Evaluation  Patient identified by MRN, date of birth, ID band Patient awake    Reviewed: Allergy & Precautions, NPO status , Patient's Chart, lab work & pertinent test results  History of Anesthesia Complications Negative for: history of anesthetic complications  Airway Mallampati: III       Dental  (+) Upper Dentures   Pulmonary neg sleep apnea, neg COPD, former smoker,           Cardiovascular hypertension, Pt. on medications (-) Past MI and (-) CHF (-) dysrhythmias (-) Valvular Problems/Murmurs     Neuro/Psych neg Seizures    GI/Hepatic Neg liver ROS, neg GERD  ,  Endo/Other  neg diabetes  Renal/GU Renal InsufficiencyRenal disease     Musculoskeletal   Abdominal   Peds  Hematology   Anesthesia Other Findings   Reproductive/Obstetrics                             Anesthesia Physical Anesthesia Plan  ASA: III  Anesthesia Plan: General   Post-op Pain Management:    Induction:   PONV Risk Score and Plan: 3 and Propofol infusion, TIVA and Treatment may vary due to age or medical condition  Airway Management Planned: Nasal Cannula  Additional Equipment:   Intra-op Plan:   Post-operative Plan:   Informed Consent: I have reviewed the patients History and Physical, chart, labs and discussed the procedure including the risks, benefits and alternatives for the proposed anesthesia with the patient or authorized representative who has indicated his/her understanding and acceptance.       Plan Discussed with:   Anesthesia Plan Comments:         Anesthesia Quick Evaluation

## 2018-12-06 NOTE — Transfer of Care (Signed)
Immediate Anesthesia Transfer of Care Note  Patient: Jacqueline Yoder  Procedure(s) Performed: FLEXIBLE SIGMOIDOSCOPY (N/A )  Patient Location: PACU  Anesthesia Type:General  Level of Consciousness: awake, alert  and oriented  Airway & Oxygen Therapy: Patient Spontanous Breathing and Patient connected to nasal cannula oxygen  Post-op Assessment: Report given to RN and Post -op Vital signs reviewed and stable  Post vital signs: Reviewed and stable  Last Vitals:  Vitals Value Taken Time  BP 114/64 12/06/2018 12:04 PM  Temp 36.2 C 12/06/2018 12:04 PM  Pulse 57 12/06/2018 12:04 PM  Resp 19 12/06/2018 12:04 PM  SpO2 99 % 12/06/2018 12:04 PM  Vitals shown include unvalidated device data.  Last Pain:  Vitals:   12/06/18 1204  TempSrc:   PainSc: 0-No pain         Complications: No apparent anesthesia complications

## 2018-12-06 NOTE — Anesthesia Postprocedure Evaluation (Signed)
Anesthesia Post Note  Patient: Jacqueline Yoder  Procedure(s) Performed: FLEXIBLE SIGMOIDOSCOPY (N/A )  Patient location during evaluation: Endoscopy Anesthesia Type: General Level of consciousness: awake and alert Pain management: pain level controlled Vital Signs Assessment: post-procedure vital signs reviewed and stable Respiratory status: spontaneous breathing, nonlabored ventilation, respiratory function stable and patient connected to nasal cannula oxygen Cardiovascular status: blood pressure returned to baseline and stable Postop Assessment: no apparent nausea or vomiting Anesthetic complications: no     Last Vitals:  Vitals:   12/06/18 1210 12/06/18 1220  BP: 126/70 134/63  Pulse:    Resp:    Temp:    SpO2:      Last Pain:  Vitals:   12/06/18 1220  TempSrc:   PainSc: 0-No pain                 [name] Ryen Heitmeyer

## 2018-12-06 NOTE — Anesthesia Post-op Follow-up Note (Signed)
Anesthesia QCDR form completed.        

## 2018-12-06 NOTE — Op Note (Signed)
[facility] Patient Name: Jacqueline Yoder Procedure Date: 12/06/2018 11:44 AM MRN: [id number] Account #: [id number] Date of Birth: 07-09-1946 Admit Type: Outpatient Age: 73 Room: [facility] ENDO ROOM 4 Gender: Female Note Status: Finalized Procedure:            Flexible Sigmoidoscopy Indications:          Rectal mass Providers:            [name] MD, MD Referring MD:         Ocie Cornfield. Ouida Sills MD, MD (Referring MD) Medicines:            Monitored Anesthesia Care Complications:        No immediate complications. Procedure:            Pre-Anesthesia Assessment:                       - Prior to the procedure, a History and Physical was                        performed, and patient medications, allergies and                        sensitivities were reviewed. The patient's tolerance of                        previous anesthesia was reviewed.                       - The risks and benefits of the procedure and the                        sedation options and risks were discussed with the                        patient. All questions were answered and informed                        consent was obtained.                       - ASA Grade Assessment: III - A patient with severe                        systemic disease.                       After obtaining informed consent, the scope was passed                        under direct vision. The Endoscope was introduced                        through the anus and advanced to the the rectosigmoid                        junction. The flexible sigmoidoscopy was accomplished                        with ease. The patient tolerated the procedure well.  The quality of the bowel preparation was poor. Findings:      The digital rectal exam findings include palpable rectal mass.      A fistula was found in the distal rectum.      An infiltrative mass was found in the distal rectum. The mass was   non-circumferential. The mass measured one cm in length. No bleeding was       present. Biopsies were taken with a cold forceps for histology.      The exam was otherwise without abnormality. Impression:           - Preparation of the colon was poor.                       - Palpable rectal mass found on digital rectal exam.                       - Colonic fistula.                       - Rule out malignancy, tumor in the distal rectum.                        Biopsied.                       - The examination was otherwise normal. Recommendation:       - Discharge patient to home (with escort).                       - Await pathology results.                       - Advance diet as tolerated. Procedure Code(s):    --- Professional ---                       [id number], 8, Sigmoidoscopy, flexible; with biopsy, single                        or multiple Diagnosis Code(s):    --- Professional ---                       K62.89, Other specified diseases of anus and rectum                       K63.2, Fistula of intestine                       D49.0, Neoplasm of unspecified behavior of digestive                        system CPT copyright 2018 [employer]. All rights reserved. The codes documented in this report are preliminary and upon coder review may  be revised to meet current compliance requirements. [name], MD [name] MD, MD 12/06/2018 12:01:03 PM This report has been signed electronically. Number of Addenda: 0 Note Initiated On: 12/06/2018 11:44 AM Total Procedure Duration: 0 hours 9 minutes 49 seconds       [facility]

## 2018-12-06 NOTE — H&P (Signed)
[name], MD [address], [place], [place], [place], [address] [address], [place], [place], [place], [address] Phone: [phone]  Fax: [phone]  Primary Care Physician:  Kirk Ruths, MD   Pre-Procedure History & Physical: HPI:  Jacqueline Yoder is a 73 y.o. female is here for a sigmoidoscopy    Past Medical History:  Diagnosis Date  . Hypercholesteremia   . Hypertension   . Hypertension   . Urinary incontinence     Past Surgical History:  Procedure Laterality Date  . CHOLECYSTECTOMY  1971  . COLONOSCOPY WITH PROPOFOL N/A 12/03/2018   Procedure: COLONOSCOPY WITH PROPOFOL;  Surgeon: Lucilla Lame, MD;  Location: [facility] ENDOSCOPY;  Service: Endoscopy;  Laterality: N/A;    Prior to Admission medications   Medication Sig Start Date End Date Taking? Authorizing Provider  Cholecalciferol (VITAMIN D3) 2000 units capsule Take 2,000 Units by mouth daily.   Yes [provider]  diclofenac sodium (VOLTAREN) 1 % GEL Apply topically as needed. 05/09/18  Yes [provider]  fexofenadine (ALLEGRA) 180 MG tablet Take 180 mg by mouth daily.   Yes [provider]  fluticasone (FLONASE) 50 MCG/ACT nasal spray 50 sprays daily. 05/07/18  Yes [provider]  ketorolac (ACULAR) 0.4 % SOLN Apply 4 drops to eye 2 (two) times daily. 06/26/18  Yes [provider]  Multiple Vitamins-Minerals (ONE-A-DAY WOMENS 50 PLUS PO) Take by mouth.   Yes [provider]  pantoprazole (PROTONIX) 40 MG tablet Take 40 mg by mouth as needed. 12/03/17  Yes [provider]  potassium chloride (K-DUR) 10 MEQ tablet Take 10 mEq by mouth daily. 03/28/18  Yes [provider]  prednisoLONE acetate (PRED FORTE) 1 % ophthalmic suspension 1 drop. 07/23/18  Yes [provider]  simvastatin (ZOCOR) 40 MG tablet Take 40 mg by mouth daily. 08/09/18  Yes [provider]  triamterene-hydrochlorothiazide (DYAZIDE) 37.5-25 MG  capsule Take by mouth daily. 10/05/17  Yes [provider]  Na Sulfate-K Sulfate-Mg Sulf (SUPREP BOWEL PREP KIT) 17.5-3.13-1.6 GM/177ML SOLN Take 1 kit by mouth as directed. Patient not taking: Reported on 12/02/2018 11/29/18   Lucilla Lame, MD    Allergies as of 12/05/2018 - Review Complete 12/05/2018  Allergen Reaction Noted  . Sulfamethoxazole-trimethoprim Other (See Comments) 02/12/2015    Family History  Problem Relation Age of Onset  . Colon cancer Brother   . Prostate cancer Brother   . Hypertension Mother   . Kidney failure Father   . Breast cancer Neg Hx   . Ovarian cancer Neg Hx     Social History   Socioeconomic History  . Marital status: Married    Spouse name: Not on file  . Number of children: Not on file  . Years of education: Not on file  . Highest education level: Not on file  Occupational History  . Not on file  Social Needs  . Financial resource strain: Not on file  . Food insecurity:    Worry: Not on file    Inability: Not on file  . Transportation needs:    Medical: Not on file    Non-medical: Not on file  Tobacco Use  . Smoking status: Former Smoker    Last attempt to quit: 12/02/1977    Years since quitting: 41.0  . Smokeless tobacco: Never Used  Substance and Sexual Activity  . Alcohol use: Never    Frequency: Never  . Drug use: Never  . Sexual activity: Not  Currently    Birth control/protection: None  Lifestyle  . Physical activity:    Days per week: Not on file    Minutes per session: Not on file  . Stress: Not on file  Relationships  . Social connections:    Talks on phone: Not on file    Gets together: Not on file    Attends religious service: Not on file    Active member of club or organization: Not on file    Attends meetings of clubs or organizations: Not on file    Relationship status: Not on file  . Intimate partner violence:    Fear of current or ex partner: Not on file    Emotionally abused: Not on file     Physically abused: Not on file    Forced sexual activity: Not on file  Other Topics Concern  . Not on file  Social History Narrative  . Not on file    Review of Systems: See HPI, otherwise negative ROS  Physical Exam: BP 128/78   Pulse 81   Temp 98.6 F (37 C) (Oral)   Resp 16   Ht '5\' 5"'  (1.651 m)   Wt 99.3 kg   SpO2 100%   BMI 36.44 kg/m  General:   Alert,  pleasant and cooperative in NAD Head:  Normocephalic and atraumatic. Neck:  Supple; no masses or thyromegaly. Lungs:  Clear throughout to auscultation, normal respiratory effort.    Heart:  +S1, +S2, Regular rate and rhythm, No edema. Abdomen:  Soft, nontender and nondistended. Normal bowel sounds, without guarding, and without rebound.   Neurologic:  Alert and  oriented x4;  grossly normal neurologically.  Impression/Plan: Jacqueline Yoder is here for a sigmoidoscopy  to be performed for rectal mass.Risks, benefits, limitations, and alternatives regarding  colonoscopy have been reviewed with the patient.  Questions have been answered.  All parties agreeable.   [name], MD  12/06/2018, 11:35 AM

## 2018-12-09 ENCOUNTER — Encounter: Payer: Self-pay | Admitting: Gastroenterology

## 2018-12-09 ENCOUNTER — Other Ambulatory Visit: Payer: Self-pay | Admitting: Anatomic Pathology & Clinical Pathology

## 2018-12-09 LAB — SURGICAL PATHOLOGY

## 2018-12-10 ENCOUNTER — Telehealth: Payer: Self-pay

## 2018-12-10 NOTE — Telephone Encounter (Signed)
Called and notified Jacqueline Yoder with the pathology results from repeat rectal biopsy. We discussed that Dr. Tasia Catchings will be presenting her case at tumor board this Thursday and would like to see her in clinic Friday, 2/28 to discuss treatment plan. We went over the role of tumor board, slide review and cancer staging/treatment in general. Dr. Tasia Catchings will talk to her regarding her staging of her rectal cancer with metastatic inguinal lymph node involvement on Friday. Her endometrial biopsy, [facility] slide review, is still pending. Appointment arranged for 2/28 at 1015 with Dr. Tasia Catchings. All of her questions answered.  Surgical Pathology  CASE: [id number]  PATIENT: Jacqueline Yoder  Surgical Pathology Report   SPECIMEN SUBMITTED:  A. Rectum mass, r/o neoplasm; cbxs   CLINICAL HISTORY:  Recent diagnosis of endometrial carcinoma, and left inguinal lymph node  with metastatic adenocarcinoma consistent with colorectal origin   PRE-OPERATIVE DIAGNOSIS:  Rectal mass K62.89   POST-OPERATIVE DIAGNOSIS:  Rectal mass and fistula   DIAGNOSIS:  A. RECTUM MASS; COLD BIOPSY:  - INVASIVE COLORECTAL ADENOCARCINOMA, MODERATELY DIFFERENTIATED.   MMR intact CEA- 388 CA 125- 17.0

## 2018-12-12 ENCOUNTER — Other Ambulatory Visit: Payer: Medicare Other

## 2018-12-12 NOTE — Progress Notes (Signed)
Tumor Board Documentation  Jacqueline Yoder was presented by Dr Tasia Catchings and Alease Medina, NP at our Tumor Board on 12/12/2018, which included representatives from medical oncology, radiation oncology, surgical oncology, surgical, radiology, pathology, navigation, internal medicine, pharmacy, genetics, research, palliative care, pulmonology.  Jacqueline Yoder currently presents as a new patient, for new positive pathology, for Jacqueline Yoder, for discussion with history of the following treatments: surgical intervention(s).  Additionally, we reviewed previous medical and familial history, history of present illness, and recent lab results along with all available histopathologic and imaging studies. The tumor board considered available treatment options and made the following recommendations: Surgery Endometrial pathology slides sent to [facility] for review, Referral to Surgical Oncology at [facility] or [facility] for Rectal Cancer  The following procedures/referrals were also placed: No orders of the defined types were placed in this encounter.   Clinical Trial Status: not discussed   Staging used: AJCC Stage Group AJCC Staging:       Group: Endometrial Cancer Grade 1    Rectal Cancer TBD National site-specific guidelines NCCN were discussed with respect to the case.  Tumor board is a meeting of clinicians from various specialty areas who evaluate and discuss patients for whom a multidisciplinary approach is being considered. Final determinations in the plan of care are those of the provider(s). The responsibility for follow up of recommendations given during tumor board is that of the provider.   Today's extended care, comprehensive team conference, Jacqueline Yoder was not present for the discussion and was not examined.   Multidisciplinary Tumor Board is a multidisciplinary case peer review process.  Decisions discussed in the Multidisciplinary Tumor Board reflect the opinions of the specialists present at the conference without having examined the  patient.  Ultimately, treatment and diagnostic decisions rest with the primary provider(s) and the patient.

## 2018-12-13 ENCOUNTER — Encounter: Payer: Self-pay | Admitting: Oncology

## 2018-12-13 ENCOUNTER — Inpatient Hospital Stay (HOSPITAL_BASED_OUTPATIENT_CLINIC_OR_DEPARTMENT_OTHER): Payer: Medicare Other | Admitting: Oncology

## 2018-12-13 ENCOUNTER — Telehealth: Payer: Self-pay

## 2018-12-13 ENCOUNTER — Other Ambulatory Visit: Payer: Self-pay

## 2018-12-13 VITALS — BP 128/70 | HR 84 | Temp 96.8°F | Resp 18 | Wt 216.8 lb

## 2018-12-13 DIAGNOSIS — R590 Localized enlarged lymph nodes: Secondary | ICD-10-CM

## 2018-12-13 DIAGNOSIS — C20 Malignant neoplasm of rectum: Secondary | ICD-10-CM | POA: Diagnosis not present

## 2018-12-13 DIAGNOSIS — Z8042 Family history of malignant neoplasm of prostate: Secondary | ICD-10-CM

## 2018-12-13 DIAGNOSIS — Z800 Family history of malignant neoplasm of digestive organs: Secondary | ICD-10-CM

## 2018-12-13 DIAGNOSIS — C541 Malignant neoplasm of endometrium: Secondary | ICD-10-CM

## 2018-12-13 NOTE — Telephone Encounter (Signed)
Appointment has been arranged with Dr. Hester Mates at [facility]. Appointment details below. She will need to come by the cancer center and pick up her imaging on CD and take it with her. These are at the registration desk.  Dr Shawn Route 12/17/18 at 1130am [place]

## 2018-12-13 NOTE — Telephone Encounter (Signed)
Referral sent to [facility] colorectal surgery. 

## 2018-12-13 NOTE — Progress Notes (Signed)
Patient here for follow up. States she is experiencing constipation.

## 2018-12-14 NOTE — Progress Notes (Signed)
Hematology/Oncology Consult note [facility] Telephone:([phone] [phone]   Patient Care Team: Kirk Ruths, MD as PCP - General (Internal Medicine) Clent Jacks, RN as Registered Nurse  REFERRING PROVIDER: Dr.Secord CHIEF COMPLAINTS/REASON FOR VISIT:  Evaluation of endometrial cancer  HISTORY OF PRESENTING ILLNESS:  Jacqueline Yoder is a  73 y.o.  female with PMH listed below who was referred to me for evaluation of endometrial cancer.   Patient initially presented with complaints of postmenopausal bleeding on 08/16/2018.  History of was menopausal vaginal bleeding in 2016 which resulted in cervical polypectomy.  Pathology 02/04/2015 showed cervical polyp, consistent with benign endometrial polyp.  [name] after polypectomy due to anxiety associated with pelvic exams.  pelvic exam on 08/16/2018 reviewed cervical abnormality and from enlarged uterus. Seen by Dr. Marcelline Mates on 10/29/2018.  Endometrial biopsy and a Pap smear was performed. 10/29/2018 Pap smear showed adenocarcinoma, favor endometrial origin. 10/29/2018 endometrial biopsy showed endometrioid carcinoma, FIGO grade 1.  10/29/2018- TA & TV Ultrasound revealed: Anteverted uterus measuring 8.7 x 5.6 x 6.4 cm without evidence of focal masses.  The endometrium measuring 24.1 mm (thickened) and heterogeneous.  Right and left ovaries not visualized.  No adnexal masses identified.  No free fluid in cul-de-sac.  Patient was seen by Dr. Theora Gianotti in clinic on 11/13/2018.  Cervical exam reveals 2 cm exophytic irregular mass consistent with malignancy.   11/19/2018 CT chest abdomen pelvis with contrast showed thickened endometrium with some irregularity compatible with the provided diagnosis of endometrial malignancy.  There is a mildly prominent left inguinal node 1.4 cm.  Patient was seen by Dr. Fransisca Connors on 11/20/2018 and left groin lymph node biopsy was recommended.  11/26/2018 patient  underwent left inguinal lymph node biopsy. Pathology showed metastatic adenocarcinoma consistent with colorectal origin.  CDX 2+.  Case was discussed on tumor board.  Recommend colonoscopy for further evaluation.  Patient reports significant weight loss 30 pounds over the last year.  Chronic vaginal spotting. Change of bowel habits the past few months.  More constipated.  Family history positive for brother positive for colon cancer prostate cancer.  INTERVAL HISTORY Jacqueline Yoder is a 73 y.o. female who has above history reviewed by me today presents for follow up visit for management of rectal cancer and endometrioid cancer. Problems and complaints are listed below: During interval patient has underwent colonoscopy on 12/03/2018 which reviewed a nonobstructing large mass in the rectum.  Also chronic fistula.  Mass was not circumferential.  This was biopsied with a cold forceps for histology.  Pathology came back hyperplastic polyp negative for dysplasia and malignancy. [name], patient underwent flex sigmoidoscopy on 12/06/2018 with rebiopsy of the rectal mass. This time biopsy results came back positive for invasive colorectal adenocarcinoma, moderately differentiated. Immunotherapy for nearly mismatch repair protein (MMR ) was performed.  There is no loss of MMR expression.  No probability of MSI high.  Currently endometrial biopsy slides will be reviewed at [facility] for second opinion. Today patient was accompanied by 2 daughters for discussion of pathology results and future management plan. Patient denies any difficulty passing stool, abdominal pain.  Chronic constipation.    Review of Systems  Constitutional: Positive for fatigue and unexpected weight change. Negative for appetite change, chills and fever.  HENT:   Negative for hearing loss and voice change.   Eyes: Negative for eye problems.  Respiratory: Negative for chest tightness and cough.     Cardiovascular:  Negative for chest pain.  Gastrointestinal: Positive for constipation. Negative for abdominal distention, abdominal pain and blood in stool.  Endocrine: Negative for hot flashes.  Genitourinary: Positive for vaginal bleeding. Negative for difficulty urinating and frequency.   Musculoskeletal: Negative for arthralgias.  Skin: Negative for itching and rash.  Neurological: Negative for extremity weakness.  Hematological: Negative for adenopathy.  Psychiatric/Behavioral: Negative for confusion.    MEDICAL HISTORY:  Past Medical History:  Diagnosis Date  . Hypercholesteremia   . Hypertension   . Hypertension   . Urinary incontinence     SURGICAL HISTORY: Past Surgical History:  Procedure Laterality Date  . CHOLECYSTECTOMY  1971  . COLONOSCOPY WITH PROPOFOL N/A 12/03/2018   Procedure: COLONOSCOPY WITH PROPOFOL;  Surgeon: Lucilla Lame, MD;  Location: [facility] ENDOSCOPY;  Service: Endoscopy;  Laterality: N/A;  . FLEXIBLE SIGMOIDOSCOPY N/A 12/06/2018   Procedure: FLEXIBLE SIGMOIDOSCOPY;  Surgeon: [name], MD;  Location: [facility] ENDOSCOPY;  Service: Endoscopy;  Laterality: N/A;    SOCIAL HISTORY: Social History   Socioeconomic History  . Marital status: Married    Spouse name: Not on file  . Number of children: Not on file  . Years of education: Not on file  . Highest education level: Not on file  Occupational History  . Not on file  Social Needs  . Financial resource strain: Not on file  . Food insecurity:    Worry: Not on file    Inability: Not on file  . Transportation needs:    Medical: Not on file    Non-medical: Not on file  Tobacco Use  . Smoking status: Former Smoker    Last attempt to quit: 12/02/1977    Years since quitting: 41.0  . Smokeless tobacco: Never Used  Substance and Sexual Activity  . Alcohol use: Never    Frequency: Never  . Drug use: Never  . Sexual activity: Not Currently    Birth control/protection: None  Lifestyle  . Physical  activity:    Days per week: Not on file    Minutes per session: Not on file  . Stress: Not on file  Relationships  . Social connections:    Talks on phone: Not on file    Gets together: Not on file    Attends religious service: Not on file    Active member of club or organization: Not on file    Attends meetings of clubs or organizations: Not on file    Relationship status: Not on file  . Intimate partner violence:    Fear of current or ex partner: Not on file    Emotionally abused: Not on file    Physically abused: Not on file    Forced sexual activity: Not on file  Other Topics Concern  . Not on file  Social History Narrative  . Not on file    FAMILY HISTORY: Family History  Problem Relation Age of Onset  . Colon cancer Brother   . Prostate cancer Brother   . Hypertension Mother   . Kidney failure Father   . Breast cancer Neg Hx   . Ovarian cancer Neg Hx     ALLERGIES:  is allergic to sulfamethoxazole-trimethoprim.  MEDICATIONS:  Current Outpatient Medications  Medication Sig Dispense Refill  . Cholecalciferol (VITAMIN D3) 2000 units capsule Take 2,000 Units by mouth daily.    . diclofenac sodium (VOLTAREN) 1 % GEL Apply topically as needed.  11  . fexofenadine (ALLEGRA) 180 MG tablet Take 180 mg by mouth daily.    Marland Kitchen  fluticasone (FLONASE) 50 MCG/ACT nasal spray 50 sprays daily.    Marland Kitchen ketorolac (ACULAR) 0.4 % SOLN Apply 4 drops to eye 2 (two) times daily.    . Multiple Vitamins-Minerals (ONE-A-DAY WOMENS 50 PLUS PO) Take by mouth.    . pantoprazole (PROTONIX) 40 MG tablet Take 40 mg by mouth as needed.    . potassium chloride (K-DUR) 10 MEQ tablet Take 10 mEq by mouth daily.    . prednisoLONE acetate (PRED FORTE) 1 % ophthalmic suspension 1 drop.  1  . simvastatin (ZOCOR) 40 MG tablet Take 40 mg by mouth daily.    Marland Kitchen triamterene-hydrochlorothiazide (DYAZIDE) 37.5-25 MG capsule Take by mouth daily.    . Na Sulfate-K Sulfate-Mg Sulf (SUPREP BOWEL PREP KIT) 17.5-3.13-1.6  GM/177ML SOLN Take 1 kit by mouth as directed. (Patient not taking: Reported on 12/02/2018) 1 Bottle 0   No current facility-administered medications for this visit.      PHYSICAL EXAMINATION: ECOG PERFORMANCE STATUS: 1 - Symptomatic but completely ambulatory Vitals:   12/13/18 1011  BP: 128/70  Pulse: 84  Resp: 18  Temp: (!) 96.8 F (36 C)   Filed Weights   12/13/18 1011  Weight: 216 lb 12.8 oz (98.3 kg)    Physical Exam Constitutional:      General: She is not in acute distress. HENT:     Head: Normocephalic and atraumatic.  Eyes:     General: No scleral icterus.    Pupils: Pupils are equal, round, and reactive to light.  Neck:     Musculoskeletal: Normal range of motion and neck supple.  Cardiovascular:     Rate and Rhythm: Normal rate and regular rhythm.     Heart sounds: Normal heart sounds.  Pulmonary:     Effort: Pulmonary effort is normal. No respiratory distress.     Breath sounds: No wheezing.  Abdominal:     General: Bowel sounds are normal. There is no distension.     Palpations: Abdomen is soft. There is no mass.     Tenderness: There is no abdominal tenderness.  Musculoskeletal: Normal range of motion.        General: No deformity.  Skin:    General: Skin is warm and dry.     Findings: No erythema or rash.  Neurological:     Mental Status: She is alert and oriented to person, place, and time.     Cranial Nerves: No cranial nerve deficit.     Coordination: Coordination normal.  Psychiatric:        Behavior: Behavior normal.        Thought Content: Thought content normal.     RADIOGRAPHIC STUDIES: I have personally reviewed the radiological images as listed and agreed with the findings in the report.  CMP Latest Ref Rng & Units 12/02/2018  Glucose 70 - 99 mg/dL 110(H)  BUN 8 - 23 mg/dL 11  Creatinine 0.44 - 1.00 mg/dL 1.11(H)  Sodium 135 - 145 mmol/L 139  Potassium 3.5 - 5.1 mmol/L 3.1(L)  Chloride 98 - 111 mmol/L 103  CO2 22 - 32 mmol/L 26    Calcium 8.9 - 10.3 mg/dL 9.4  Total Protein 6.5 - 8.1 g/dL 7.2  Total Bilirubin 0.3 - 1.2 mg/dL 0.5  Alkaline Phos 38 - 126 U/L 32(L)  AST 15 - 41 U/L 28  ALT 0 - 44 U/L 19   CBC Latest Ref Rng & Units 12/02/2018  WBC 4.0 - 10.5 K/uL 4.9  Hemoglobin 12.0 - 15.0 g/dL 13.2  Hematocrit 36.0 - 46.0 % 40.4  Platelets 150 - 400 K/uL 295    LABORATORY DATA:  I have reviewed the data as listed Lab Results  Component Value Date   WBC 4.9 12/02/2018   HGB 13.2 12/02/2018   HCT 40.4 12/02/2018   MCV 91.2 12/02/2018   PLT 295 12/02/2018   Recent Labs    11/13/18 1422 12/02/18 1139  NA  --  139  K  --  3.1*  CL  --  103  CO2  --  26  GLUCOSE  --  110*  BUN  --  11  CREATININE 1.13* 1.11*  CALCIUM  --  9.4  GFRNONAA 49* 50*  GFRAA 57* 58*  PROT  --  7.2  ALBUMIN  --  4.2  AST  --  28  ALT  --  19  ALKPHOS  --  32*  BILITOT  --  0.5   Iron/TIBC/Ferritin/ %Sat No results found for: IRON, TIBC, FERRITIN, IRONPCTSAT   RADIOGRAPHIC STUDIES: I have personally reviewed the radiological images as listed and agreed with the findings in the report. #11/19/2018 CT chest abdomen pelvis chest 1. Thickened endometrium with some irregularity compatible with the provided diagnosis of endometrial malignancy. There is a mildly prominent left inguinal lymph node at 1.4 cm in short axis which may reflect nodal spread. 2. Bilateral cystic renal lesions. We do not have precontrast images in order to be able to assigned Bosniak classifications. No significant degree of de-enhancement to favor solid mass. 3. Aortic Atherosclerosis (ICD10-I70.0). Coronary atherosclerosis. Sigmoid colon diverticulosis, with several areas of mild colon wall thickening probably from contraction. Correlation with the patient's colon cancer screening history is recommended. If screening is not up-to-date, appropriate screening should be considered. 4. Lumbar impingement at L3-4 and L4-5.5. Esophageal reflux versus  dysmotility.  ASSESSMENT & PLAN:  1. Endometrial cancer determined by uterine biopsy ([place])   2. Lymphadenopathy, inguinal   3. Rectal cancer ([place])   4. Family history of colon cancer   5. Family history of prostate cancer    Image findings, pathology findings were discussed in detail with patient. Clinically patient has 2 primaries, rectal cancer and stage II endometrial cancer. Currently endometrial biopsy in the past.  Slides will be reviewed at [facility] to confirm she truly has 2 primaries  Discussed with patient that left inguinal lymph node involvement indicates metastatic disease.  However, recent meta-analysis showed that such patients who are eligible for curative surgical treatment had a 5-year survival rate of 52% Recommend patient to obtain a surgical opinion at [facility] surgery to see if she will be a candidate for definitive surgical resection as well as inguinal lymph node dissection. If she is a surgical candidate, will consider neoadjuvant concurrent chemotherapy and radiation. TNT can be an option however takes much longer time to reach surgery point and I am afraid it may delay her endometrial cancer treatment.  #Noticed family history of colon cancer, prostate cancer in brother, she has personal history of endometrial cancer and pending further work-up for possible colorectal malignancy.  Discussed about genetic testing. Patient family wants to think about that and update me.   Cc Dr.Secord, Dr.ANderson  Return of visit: To be determined. We spent sufficient time to discuss many aspect of care, questions were answered to patient's satisfaction. Total face to face encounter time for this patient visit was 25 min. >50% of the time was  spent in counseling and coordination of care.   Earlie Server, MD, PhD  Hematology [place] at [facility] Pager- [phone]

## 2018-12-15 DIAGNOSIS — C20 Malignant neoplasm of rectum: Secondary | ICD-10-CM

## 2018-12-15 HISTORY — DX: Malignant neoplasm of rectum: C20

## 2018-12-16 ENCOUNTER — Encounter: Payer: Self-pay | Admitting: Oncology

## 2018-12-16 DIAGNOSIS — Z7189 Other specified counseling: Secondary | ICD-10-CM | POA: Insufficient documentation

## 2019-01-06 HISTORY — PX: LAPAROSCOPIC COLOSTOMY: SHX1921

## 2019-01-17 ENCOUNTER — Telehealth: Payer: Self-pay

## 2019-01-17 DIAGNOSIS — C20 Malignant neoplasm of rectum: Secondary | ICD-10-CM

## 2019-01-17 NOTE — Telephone Encounter (Signed)
Diverting colostomy placed 01-06-19. She is ready to start TNT per recommendations. Scheduling notified.   DISCUSSION/RECOMMENDATION: GYN ONC team agreeable with treating for rectal with TNT first, as the radation will also partially treat the endometrial cancer, with plans for combined surgery down the road. Will need diverted first ( will need permanent stoma due to rectal tumor location). Will plan for chemorads and then chemo. MRI for surgical planning after TNT.

## 2019-01-20 ENCOUNTER — Telehealth: Payer: Self-pay

## 2019-01-20 NOTE — Telephone Encounter (Signed)
Called and notified Jacqueline Yoder with 4/8 appointments. Read back performed.

## 2019-01-21 ENCOUNTER — Other Ambulatory Visit: Payer: Self-pay

## 2019-01-22 ENCOUNTER — Inpatient Hospital Stay: Payer: Medicare Other

## 2019-01-22 ENCOUNTER — Telehealth: Payer: Self-pay | Admitting: Pharmacist

## 2019-01-22 ENCOUNTER — Inpatient Hospital Stay: Payer: Medicare Other | Attending: Oncology | Admitting: Oncology

## 2019-01-22 ENCOUNTER — Ambulatory Visit
Admission: RE | Admit: 2019-01-22 | Discharge: 2019-01-22 | Disposition: A | Discharge status: Home / Self Care | Payer: Medicare Other | Source: Ambulatory Visit | Attending: Radiation Oncology | Admitting: Radiation Oncology

## 2019-01-22 ENCOUNTER — Other Ambulatory Visit: Payer: Self-pay

## 2019-01-22 ENCOUNTER — Encounter: Payer: Self-pay | Admitting: Oncology

## 2019-01-22 VITALS — BP 129/80 | HR 80 | Temp 97.5°F | Resp 18 | Wt 210.6 lb

## 2019-01-22 DIAGNOSIS — Z800 Family history of malignant neoplasm of digestive organs: Secondary | ICD-10-CM | POA: Diagnosis not present

## 2019-01-22 DIAGNOSIS — Z7189 Other specified counseling: Secondary | ICD-10-CM

## 2019-01-22 DIAGNOSIS — Z87891 Personal history of nicotine dependence: Secondary | ICD-10-CM | POA: Diagnosis not present

## 2019-01-22 DIAGNOSIS — C20 Malignant neoplasm of rectum: Secondary | ICD-10-CM

## 2019-01-22 DIAGNOSIS — Z8542 Personal history of malignant neoplasm of other parts of uterus: Secondary | ICD-10-CM | POA: Diagnosis not present

## 2019-01-22 DIAGNOSIS — E7800 Pure hypercholesterolemia, unspecified: Secondary | ICD-10-CM | POA: Insufficient documentation

## 2019-01-22 DIAGNOSIS — E876 Hypokalemia: Secondary | ICD-10-CM | POA: Insufficient documentation

## 2019-01-22 DIAGNOSIS — I700 Atherosclerosis of aorta: Secondary | ICD-10-CM | POA: Insufficient documentation

## 2019-01-22 DIAGNOSIS — C541 Malignant neoplasm of endometrium: Secondary | ICD-10-CM

## 2019-01-22 DIAGNOSIS — Z8042 Family history of malignant neoplasm of prostate: Secondary | ICD-10-CM

## 2019-01-22 DIAGNOSIS — Z79899 Other long term (current) drug therapy: Secondary | ICD-10-CM | POA: Diagnosis not present

## 2019-01-22 DIAGNOSIS — R634 Abnormal weight loss: Secondary | ICD-10-CM | POA: Diagnosis not present

## 2019-01-22 DIAGNOSIS — Z933 Colostomy status: Secondary | ICD-10-CM | POA: Insufficient documentation

## 2019-01-22 DIAGNOSIS — I10 Essential (primary) hypertension: Secondary | ICD-10-CM | POA: Diagnosis not present

## 2019-01-22 LAB — COMPREHENSIVE METABOLIC PANEL
ALT: 23 U/L (ref 0–44)
AST: 31 U/L (ref 15–41)
Albumin: 4.2 g/dL (ref 3.5–5.0)
Alkaline Phosphatase: 34 U/L — ABNORMAL LOW (ref 38–126)
Anion gap: 10 (ref 5–15)
BUN: 7 mg/dL — ABNORMAL LOW (ref 8–23)
CO2: 27 mmol/L (ref 22–32)
Calcium: 9.5 mg/dL (ref 8.9–10.3)
Chloride: 102 mmol/L (ref 98–111)
Creatinine, Ser: 0.98 mg/dL (ref 0.44–1.00)
GFR calc Af Amer: >60 mL/min (ref >60)
GFR calc non Af Amer: 58 mL/min — ABNORMAL LOW (ref >60)
Glucose, Bld: 104 mg/dL — ABNORMAL HIGH (ref 70–99)
Potassium: 3.3 mmol/L — ABNORMAL LOW (ref 3.5–5.1)
Sodium: 139 mmol/L (ref 135–145)
Total Bilirubin: 0.5 mg/dL (ref 0.3–1.2)
Total Protein: 7.2 g/dL (ref 6.5–8.1)

## 2019-01-22 LAB — CBC WITH DIFFERENTIAL/PLATELET
Abs Immature Granulocytes: 0.02 10*3/uL (ref 0.00–0.07)
Basophils Absolute: 0 10*3/uL (ref 0.0–0.1)
Basophils Relative: 1 %
Eosinophils Absolute: 0.1 10*3/uL (ref 0.0–0.5)
Eosinophils Relative: 3 %
HCT: 38 % (ref 36.0–46.0)
Hemoglobin: 12.2 g/dL (ref 12.0–15.0)
Immature Granulocytes: 0 %
Lymphocytes Relative: 42 %
Lymphs Abs: 2.1 10*3/uL (ref 0.7–4.0)
MCH: 29.9 pg (ref 26.0–34.0)
MCHC: 32.1 g/dL (ref 30.0–36.0)
MCV: 93.1 fL (ref 80.0–100.0)
Monocytes Absolute: 0.4 10*3/uL (ref 0.1–1.0)
Monocytes Relative: 7 %
Neutro Abs: 2.3 10*3/uL (ref 1.7–7.7)
Neutrophils Relative %: 47 %
Platelets: 287 10*3/uL (ref 150–400)
RBC: 4.08 MIL/uL (ref 3.87–5.11)
RDW: 13.9 % (ref 11.5–15.5)
WBC: 4.9 10*3/uL (ref 4.0–10.5)
nRBC: 0 % (ref 0.0–0.2)

## 2019-01-22 NOTE — Telephone Encounter (Addendum)
Oral Oncology Pharmacist Encounter  Received new prescription for Xeloda (capecitabine) for the treatment of advanced adenocarcinoma the rectum in conjunction with radiation, planned duration until the end of radiation therapy.  CMP from 01/22/2019 assessed, no relevant lab abnormalities. Prescription dose and frequency assessed.   Current medication list in Epic reviewed, one DDIs with capecitabine identified: - Pantoprazole: Proton Pump Inhibitors (PPI) may diminish the therapeutic effect of capecitabine. Recommend evaluating the need for a PPI/acid suppression. If acid suppression is needed, recommend switching to a H2 antagonist (eg, famotidine) to avoid this DDI.   Prescription has been e-scribed to the [facility] for benefits analysis and approval.  Oral Oncology Clinic will continue to follow for insurance authorization, copayment issues, initial counseling and start date.  Darl Pikes, PharmD, BCPS, [facility] Hematology/Oncology Clinical Pharmacist [facility] Oral [place] [facility] [phone]  01/22/2019 11:43 AM

## 2019-01-22 NOTE — Telephone Encounter (Addendum)
Oral Chemotherapy Pharmacist Encounter   PA not required  Darl Pikes, PharmD, BCPS, [facility] Hematology/Oncology Clinical Pharmacist [facility] Oral [place] [facility] [phone]  01/22/2019 4:35 PM

## 2019-01-22 NOTE — Progress Notes (Signed)
Hematology/Oncology Consult note [facility] Telephone:([phone] [phone]   Patient Care Team: Kirk Ruths, MD as PCP - General (Internal Medicine) Clent Jacks, RN as Registered Nurse  REFERRING PROVIDER: Dr.Secord CHIEF COMPLAINTS/REASON FOR VISIT:  Evaluation of endometrial cancer  HISTORY OF PRESENTING ILLNESS:  Jacqueline Yoder is a  73 y.o.  female with PMH listed below who was referred to me for evaluation of endometrial cancer.   Patient initially presented with complaints of postmenopausal bleeding on 08/16/2018.  History of was menopausal vaginal bleeding in 2016 which resulted in cervical polypectomy.  Pathology 02/04/2015 showed cervical polyp, consistent with benign endometrial polyp.  [name] after polypectomy due to anxiety associated with pelvic exams.  pelvic exam on 08/16/2018 reviewed cervical abnormality and from enlarged uterus. Seen by Dr. Marcelline Mates on 10/29/2018.  Endometrial biopsy and a Pap smear was performed. 10/29/2018 Pap smear showed adenocarcinoma, favor endometrial origin. 10/29/2018 endometrial biopsy showed endometrioid carcinoma, FIGO grade 1.  10/29/2018- TA & TV Ultrasound revealed: Anteverted uterus measuring 8.7 x 5.6 x 6.4 cm without evidence of focal masses.  The endometrium measuring 24.1 mm (thickened) and heterogeneous.  Right and left ovaries not visualized.  No adnexal masses identified.  No free fluid in cul-de-sac.  Patient was seen by Dr. Theora Gianotti in clinic on 11/13/2018.  Cervical exam reveals 2 cm exophytic irregular mass consistent with malignancy.   11/19/2018 CT chest abdomen pelvis with contrast showed thickened endometrium with some irregularity compatible with the provided diagnosis of endometrial malignancy.  There is a mildly prominent left inguinal node 1.4 cm.  Patient was seen by Dr. Fransisca Connors on 11/20/2018 and left groin lymph node biopsy was recommended.  11/26/2018 patient  underwent left inguinal lymph node biopsy. Pathology showed metastatic adenocarcinoma consistent with colorectal origin.  CDX 2+.  Case was discussed on tumor board.  Recommend colonoscopy for further evaluation.  Patient reports significant weight loss 30 pounds over the last year.  Chronic vaginal spotting. Change of bowel habits the past few months.  More constipated.  Family history positive for brother who has colon cancer prostate cancer.  patient has underwent colonoscopy on 12/03/2018 which reviewed a nonobstructing large mass in the rectum.  Also chronic fistula.  Mass was not circumferential.  This was biopsied with a cold forceps for histology.  Pathology came back hyperplastic polyp negative for dysplasia and malignancy. [name], patient underwent flex sigmoidoscopy on 12/06/2018 with rebiopsy of the rectal mass. This time biopsy results came back positive for invasive colorectal adenocarcinoma, moderately differentiated. Immunotherapy for nearly mismatch repair protein (MMR ) was performed.  There is no loss of MMR expression.  low probability of MSI high.   INTERVAL HISTORY Jacqueline Yoder is a 73 y.o. female who has above history reviewed by me today presents for follow up visit for discussion of rectal cancer management. During the interval, patient has been referred to [facility] surgery for evaluation of resectability for rectal cancer. In addition, she also had a second opinion with [facility] pathology where her endometrial biopsy pathology was changed to  adenocarcinoma, consistent with colorectal primary.   [name]. She has home health that has been assisting with ostomy care  Patient was also evaluated by [facility] oncology.  Recommendation is to proceed with TNT with concurrent chemoradiation followed by neoadjuvant chemotherapy followed by surgical resection. Patient prefers to have treatment done locally with [facility]. Presents  to discuss her management plan.  Patient reports no concerns with her colostomy.  Normal output.  Denies any pain or stoma concerns. Denies any fever, chills, cough, sore throat, chest pain, abdominal pain. She caught her daughter who is placed on phone speaker and was able to participate in urgent conversation.  Appetite is fair.  She has lost it about 6 pounds since end of February 2020. management of rectal cancer  Problems and complaint     Review of Systems  Constitutional: Positive for fatigue and unexpected weight change. Negative for appetite change, chills and fever.  HENT:   Negative for hearing loss and voice change.   Eyes: Negative for eye problems.  Respiratory: Negative for chest tightness and cough.   Cardiovascular: Negative for chest pain.  Gastrointestinal: Negative for abdominal distention, abdominal pain, blood in stool and constipation.  Endocrine: Negative for hot flashes.  Genitourinary: Negative for difficulty urinating and frequency.   Musculoskeletal: Negative for arthralgias.  Skin: Negative for itching and rash.  Neurological: Negative for extremity weakness.  Hematological: Negative for adenopathy.  Psychiatric/Behavioral: Negative for confusion.    MEDICAL HISTORY:  Past Medical History:  Diagnosis Date   Hypercholesteremia    Hypertension    Hypertension    Urinary incontinence     SURGICAL HISTORY: Past Surgical History:  Procedure Laterality Date   CHOLECYSTECTOMY  1971   COLONOSCOPY WITH PROPOFOL N/A 12/03/2018   Procedure: COLONOSCOPY WITH PROPOFOL;  Surgeon: Lucilla Lame, MD;  Location: [facility] ENDOSCOPY;  Service: Endoscopy;  Laterality: N/A;   FLEXIBLE SIGMOIDOSCOPY N/A 12/06/2018   Procedure: FLEXIBLE SIGMOIDOSCOPY;  Surgeon: [name], MD;  Location[facility] ENDOSCOPY;  Service: Endoscopy;  Laterality: N/A;    SOCIAL HISTORY: Social History   Socioeconomic History   Marital status: Married    Spouse name: Not on file    Number of children: Not on file   Years of education: Not on file   Highest education level: Not on file  Occupational History   Not on file  Social Needs   Financial resource strain: Not on file   Food insecurity:    Worry: Not on file    Inability: Not on file   Transportation needs:    Medical: Not on file    Non-medical: Not on file  Tobacco Use   Smoking status: Former Smoker    Last attempt to quit: 12/02/1977    Years since quitting: 41.1   Smokeless tobacco: Never Used  Substance and Sexual Activity   Alcohol use: Never    Frequency: Never   Drug use: Never   Sexual activity: Not Currently    Birth control/protection: None  Lifestyle   Physical activity:    Days per week: Not on file    Minutes per session: Not on file   Stress: Not on file  Relationships   Social connections:    Talks on phone: Not on file    Gets together: Not on file    Attends religious service: Not on file    Active member of club or organization: Not on file    Attends meetings of clubs or organizations: Not on file    Relationship status: Not on file   Intimate partner violence:    Fear of current or ex partner: Not on file    Emotionally abused: Not on file    Physically abused: Not on file    Forced sexual activity: Not on file  Other Topics Concern   Not on file  Social History Narrative  Not on file    FAMILY HISTORY: Family History  Problem Relation Age of Onset   Colon cancer Brother    Prostate cancer Brother    Hypertension Mother    Kidney failure Father    Breast cancer Neg Hx    Ovarian cancer Neg Hx     ALLERGIES:  is allergic to sulfamethoxazole-trimethoprim.  MEDICATIONS:  Current Outpatient Medications  Medication Sig Dispense Refill   Cholecalciferol (VITAMIN D3) 2000 units capsule Take 2,000 Units by mouth daily.     diclofenac sodium (VOLTAREN) 1 % GEL Apply topically as needed.  11   docusate sodium (COLACE) 100 MG capsule  Take by mouth.     enoxaparin (LOVENOX) 40 MG/0.4ML injection INJECT 0.4ML (40NG TOTAL) SUBCUTANEOUSLY ONCE DAILY FOR 28 DAYS     fexofenadine (ALLEGRA) 180 MG tablet Take 180 mg by mouth daily.     fluticasone (FLONASE) 50 MCG/ACT nasal spray 50 sprays daily.     ketorolac (ACULAR) 0.4 % SOLN Apply 4 drops to eye 2 (two) times daily.     Lutein 40 MG CAPS Take by mouth.     Multiple Vitamins-Minerals (ONE-A-DAY WOMENS 50 PLUS PO) Take by mouth.     pantoprazole (PROTONIX) 40 MG tablet Take 40 mg by mouth as needed.     potassium chloride (K-DUR) 10 MEQ tablet Take 10 mEq by mouth daily.     prednisoLONE acetate (PRED FORTE) 1 % ophthalmic suspension 1 drop.  1   simvastatin (ZOCOR) 40 MG tablet Take 40 mg by mouth daily.     triamterene-hydrochlorothiazide (DYAZIDE) 37.5-25 MG capsule Take by mouth daily.     Na Sulfate-K Sulfate-Mg Sulf (SUPREP BOWEL PREP KIT) 17.5-3.13-1.6 GM/177ML SOLN Take 1 kit by mouth as directed. (Patient not taking: Reported on 12/02/2018) 1 Bottle 0   No current facility-administered medications for this visit.      PHYSICAL EXAMINATION: ECOG PERFORMANCE STATUS: 1 - Symptomatic but completely ambulatory Vitals:   01/22/19 1005  BP: 129/80  Pulse: 80  Resp: 18  Temp: (!) 97.5 F (36.4 C)   Filed Weights   01/22/19 1005  Weight: 210 lb 9.6 oz (95.5 kg)    Physical Exam Constitutional:      General: She is not in acute distress. HENT:     Head: Normocephalic and atraumatic.  Eyes:     General: No scleral icterus.    Pupils: Pupils are equal, round, and reactive to light.  Neck:     Musculoskeletal: Normal range of motion and neck supple.  Cardiovascular:     Rate and Rhythm: Normal rate and regular rhythm.     Heart sounds: Normal heart sounds.  Pulmonary:     Effort: Pulmonary effort is normal. No respiratory distress.     Breath sounds: No wheezing.  Abdominal:     General: Bowel sounds are normal. There is no distension.      Palpations: Abdomen is soft. There is no mass.     Tenderness: There is no abdominal tenderness.     Comments: + colostomy bag.   Musculoskeletal: Normal range of motion.        General: No deformity.  Skin:    General: Skin is warm and dry.     Findings: No erythema or rash.  Neurological:     Mental Status: She is alert and oriented to person, place, and time.     Cranial Nerves: No cranial nerve deficit.     Coordination: Coordination normal.  Psychiatric:  Behavior: Behavior normal.        Thought Content: Thought content normal.     RADIOGRAPHIC STUDIES: I have personally reviewed the radiological images as listed and agreed with the findings in the report.  CMP Latest Ref Rng & Units 01/22/2019  Glucose 70 - 99 mg/dL 104(H)  BUN 8 - 23 mg/dL 7(L)  Creatinine 0.44 - 1.00 mg/dL 0.98  Sodium 135 - 145 mmol/L 139  Potassium 3.5 - 5.1 mmol/L 3.3(L)  Chloride 98 - 111 mmol/L 102  CO2 22 - 32 mmol/L 27  Calcium 8.9 - 10.3 mg/dL 9.5  Total Protein 6.5 - 8.1 g/dL 7.2  Total Bilirubin 0.3 - 1.2 mg/dL 0.5  Alkaline Phos 38 - 126 U/L 34(L)  AST 15 - 41 U/L 31  ALT 0 - 44 U/L 23   CBC Latest Ref Rng & Units 01/22/2019  WBC 4.0 - 10.5 K/uL 4.9  Hemoglobin 12.0 - 15.0 g/dL 12.2  Hematocrit 36.0 - 46.0 % 38.0  Platelets 150 - 400 K/uL 287    LABORATORY DATA:  I have reviewed the data as listed Lab Results  Component Value Date   WBC 4.9 01/22/2019   HGB 12.2 01/22/2019   HCT 38.0 01/22/2019   MCV 93.1 01/22/2019   PLT 287 01/22/2019   Recent Labs    11/13/18 1422 12/02/18 1139 01/22/19 1131  NA  --  139 139  K  --  3.1* 3.3*  CL  --  103 102  CO2  --  26 27  GLUCOSE  --  110* 104*  BUN  --  11 7*  CREATININE 1.13* 1.11* 0.98  CALCIUM  --  9.4 9.5  GFRNONAA 49* 50* 58*  GFRAA 57* 58* >60  PROT  --  7.2 7.2  ALBUMIN  --  4.2 4.2  AST  --  28 31  ALT  --  19 23  ALKPHOS  --  32* 34*  BILITOT  --  0.5 0.5   Iron/TIBC/Ferritin/ %Sat No results found for:  IRON, TIBC, FERRITIN, IRONPCTSAT   RADIOGRAPHIC STUDIES: I have personally reviewed the radiological images as listed and agreed with the findings in the report. #11/19/2018 CT chest abdomen pelvis chest 1. Thickened endometrium with some irregularity compatible with the provided diagnosis of endometrial malignancy. There is a mildly prominent left inguinal lymph node at 1.4 cm in short axis which may reflect nodal spread. 2. Bilateral cystic renal lesions. We do not have precontrast images in order to be able to assigned Bosniak classifications. No significant degree of de-enhancement to favor solid mass. 3. Aortic Atherosclerosis (ICD10-I70.0). Coronary atherosclerosis. Sigmoid colon diverticulosis, with several areas of mild colon wall thickening probably from contraction. Correlation with the patient's colon cancer screening history is recommended. If screening is not up-to-date, appropriate screening should be considered. 4. Lumbar impingement at L3-4 and L4-5.5. Esophageal reflux versus dysmotility.  ASSESSMENT & PLAN:  1. Rectal cancer ([place])   2. Goals of care, counseling/discussion   3. Family history of colon cancer   4. Family history of prostate cancer   5. Hypokalemia   Cancer Staging Rectal cancer [facility]) Staging form: Colon and Rectum, AJCC 8th Edition - Clinical stage from 01/23/2019: Stage IIIC (cT4b, cN1a, cM0) - Signed by Earlie Server, MD on 01/23/2019  Pathology opinion from [place] was discussed with the patient and her daughter was able to hear our conversation and participating discussion through phone speaker.  Recommend TNT protocol with concurrent chemo and radiation followed by systemic  chemotherapy followed by surgical resection.  Patient has appointment with radiation oncology today. Concurrent Xeloda 865m /m2 BID - rounded to 16569mBID- on days of radiation.   The diagnosis and care plan were discussed with patient in detail.  NCCN guidelines were reviewed and shared with  patient.  Goal of care is treatment with curative intent.  Chemotherapy education was provided.  We had discussed the composition of chemotherapy regimen, length of chemo cycle, duration of treatment and the time to assess response to treatment.    I explained to the patient the risks and benefits of chemotherapy Xeloda, later on FOLFOX including all but not limited to chemotherapy infusion reaction, hair loss, mouth sore, nausea, vomiting, low blood counts, bleeding, skin toxicity, neuropathy and risk of life threatening infection and even death, secondary malignancy etc.  Patient voices understanding and willing to proceed chemotherapy.   # Chemotherapy education; Medi- port placement will be done after he finish concurrent chemoradiation before starting systemic treatment.. Antiemetics-Zofran and Compazine; EMLA cream sent to pharmacy  Supportive care measures are necessary for patient well-being and will be provided as necessary. We spent sufficient time to discuss many aspect of care, questions were answered to patient's satisfaction.  # Hypokalemia, K is 3.3, I sent Rx of oral potassium 2052mdaily x 3 days.   #Noticed family history of colon cancer, prostate cancer in brother, she has personal history of endometrial cancer and pending further work-up for possible colorectal malignancy.  Discussed about genetic testing.  Family members still thinking about genetic testing.  They will update me.  Communicated with Radonc, she is having CT simulation and will anticipate to start RT on 02/04/2019.   We spent sufficient time to discuss many aspect of care, questions were answered to patient's satisfaction. Return of visit: 02/07/2019 for assessment of tolerability of chemotherapy.   ZhoEarlie ServerD, PhD Hematology Oncology [facility] [id number]

## 2019-01-22 NOTE — Consult Note (Signed)
NEW PATIENT EVALUATION  Name: Jacqueline Yoder  MRN: [id number]  Date:   01/22/2019     DOB: [birth year]   This 73 y.o. female patient presents to the clinic for initial evaluation of locally advanced rectal cancer for neoadjuvant chemoradiation prior to surgical resection.  REFERRING PHYSICIAN: Kirk Ruths, MD  CHIEF COMPLAINT: No chief complaint on file.   DIAGNOSIS: There were no encounter diagnoses.   PREVIOUS INVESTIGATIONS: CT scans reviewed Pathology report reviewed Clinical notes including notes from [facility] reviewed  OIB:[id number] is a 73 year old female who initially presented in November 2009 with postmenopausal bleeding.  She was found have a cervical polyp which was biopsied consistent with benign endometrial polyp.  She was lost to follow-up presented back in January 2020 and had cervical abnormality and an enlarged uterus Pap smear showed adenocarcinoma favoring endometrial origin.  Ultrasound of her uterus showed no evidence of focal mass.  CT scan in February showed thickened endometrium and a prominent left inguinal lymph node at 1.4 cm.  Inguinal biopsy was positive for metastatic adenocarcinoma consistent with colorectal origin CDX 2+ colonoscopy revealed a nonobstructing large mass in the rectum with fistula.  Pathology was hyperplastic polyp negative for malignancy.  On digital rectal exam she had a palpable rectal mass biopsy was positive for invasive colorectal adenocarcinoma moderately differentiated.  MSS MRI performed in March 2020 showed invasion of the rectal sphincter distal vagina and pelvic floor.  She had a right pararectal lymph node at 1.4 cm with other small but suspicious mesorectal lymph nodes.  Also left inguinal lymph node.  Because of the fistula she had a diverting colostomy which is functioning well.  She was seen for second opinion at [facility] and they recommended neoadjuvant chemoradiation prior to a surgical attempt.  Patient patient has been  re-viewed by Dr. Fransisca Connors who favors neoadjuvant chemotherapy including her endometrium at this time and will have hysterectomy at the same time as her rectal cancer.  Patient is seen today for radiation oncology opinion.  She is held still has some vaginal spotting.  Colostomy is functioning well no problems with abdominal discomfort or pain.    PLANNED TREATMENT REGIMEN: Neoadjuvant chemoradiation prior to surgical resection in 73 year old female  PAST MEDICAL HISTORY:  has a past medical history of Hypercholesteremia, Hypertension, Hypertension, and Urinary incontinence.    PAST SURGICAL HISTORY:  Past Surgical History:  Procedure Laterality Date  . CHOLECYSTECTOMY  1971  . COLONOSCOPY WITH PROPOFOL N/A 12/03/2018   Procedure: COLONOSCOPY WITH PROPOFOL;  Surgeon: Lucilla Lame, MD;  Location[facility] ENDOSCOPY;  Service: Endoscopy;  Laterality: N/A;  . FLEXIBLE SIGMOIDOSCOPY N/A 12/06/2018   Procedure: FLEXIBLE SIGMOIDOSCOPY;  Surgeon: [name], MD;  Location: [facility] ENDOSCOPY;  Service: Endoscopy;  Laterality: N/A;    FAMILY HISTORY: family history includes Colon cancer in her brother; Hypertension in her mother; Kidney failure in her father; Prostate cancer in her brother.  SOCIAL HISTORY:  reports that she quit smoking about 41 years ago. She has never used smokeless tobacco. She reports that she does not drink alcohol or use drugs.  ALLERGIES: Sulfamethoxazole-trimethoprim  MEDICATIONS:  Current Outpatient Medications  Medication Sig Dispense Refill  . Cholecalciferol (VITAMIN D3) 2000 units capsule Take 2,000 Units by mouth daily.    . diclofenac sodium (VOLTAREN) 1 % GEL Apply topically as needed.  11  . docusate sodium (COLACE) 100 MG capsule Take by mouth.    . enoxaparin (LOVENOX) 40 MG/0.4ML injection INJECT 0.4ML (40NG TOTAL) SUBCUTANEOUSLY ONCE DAILY FOR  28 DAYS    . fexofenadine (ALLEGRA) 180 MG tablet Take 180 mg by mouth daily.    . fluticasone (FLONASE) 50 MCG/ACT nasal  spray 50 sprays daily.    Marland Kitchen ketorolac (ACULAR) 0.4 % SOLN Apply 4 drops to eye 2 (two) times daily.    . Lutein 40 MG CAPS Take by mouth.    . Multiple Vitamins-Minerals (ONE-A-DAY WOMENS 50 PLUS PO) Take by mouth.    . Na Sulfate-K Sulfate-Mg Sulf (SUPREP BOWEL PREP KIT) 17.5-3.13-1.6 GM/177ML SOLN Take 1 kit by mouth as directed. (Patient not taking: Reported on 12/02/2018) 1 Bottle 0  . pantoprazole (PROTONIX) 40 MG tablet Take 40 mg by mouth as needed.    . potassium chloride (K-DUR) 10 MEQ tablet Take 10 mEq by mouth daily.    . prednisoLONE acetate (PRED FORTE) 1 % ophthalmic suspension 1 drop.  1  . simvastatin (ZOCOR) 40 MG tablet Take 40 mg by mouth daily.    Marland Kitchen triamterene-hydrochlorothiazide (DYAZIDE) 37.5-25 MG capsule Take by mouth daily.     No current facility-administered medications for this encounter.     ECOG PERFORMANCE STATUS:  1 - Symptomatic but completely ambulatory  REVIEW OF SYSTEMS: Patient denies any weight loss, fatigue, weakness, fever, chills or night sweats. Patient denies any loss of vision, blurred vision. Patient denies any ringing  of the ears or hearing loss. No irregular heartbeat. Patient denies heart murmur or history of fainting. Patient denies any chest pain or pain radiating to her upper extremities. Patient denies any shortness of breath, difficulty breathing at night, cough or hemoptysis. Patient denies any swelling in the lower legs. Patient denies any nausea vomiting, vomiting of blood, or coffee ground material in the vomitus. Patient denies any stomach pain. Patient states has had normal bowel movements no significant constipation or diarrhea. Patient denies any dysuria, hematuria or significant nocturia. Patient denies any problems walking, swelling in the joints or loss of balance. Patient denies any skin changes, loss of hair or loss of weight. Patient denies any excessive worrying or anxiety or significant depression. Patient denies any problems  with insomnia. Patient denies excessive thirst, polyuria, polydipsia. Patient denies any swollen glands, patient denies easy bruising or easy bleeding. Patient denies any recent infections, allergies or URI. Patient "s visual fields have not changed significantly in recent time.   PHYSICAL EXAM: There were no vitals taken for this visit. Well-developed well-nourished patient in NAD. HEENT reveals PERLA, EOMI, discs not visualized.  Oral cavity is clear. No oral mucosal lesions are identified. Neck is clear without evidence of cervical or supraclavicular adenopathy. Lungs are clear to A&P. Cardiac examination is essentially unremarkable with regular rate and rhythm without murmur rub or thrill. Abdomen is benign with no organomegaly or masses noted. Motor sensory and DTR levels are equal and symmetric in the upper and lower extremities. Cranial nerves II through XII are grossly intact. Proprioception is intact. No peripheral adenopathy or edema is identified. No motor or sensory levels are noted. Crude visual fields are within normal range.  LABORATORY DATA: Pathology reports reviewed    RADIOLOGY RESULTS: CT scans reviewed and compatible with above-stated findings   IMPRESSION: Locally advanced adenocarcinoma the rectum with inguinal involvement and T4 disease by direct extension into the uterus stage IV disease  PLAN: At this time are to go ahead with radiation therapy along with concurrent chemotherapy.  I will plan on delivering large field whole pelvis including her inguinal lymph node to 4500 centigrade.  I would  boost her area of primary tumor involvement as well as her inguinal node another 540 cGy.  I have personally set up and ordered CT simulation near future risks and benefits of treatment including possible increased diarrhea although she does have a diverting colostomy fatigue skin reaction increased lower urinary tract symptoms or were described in detail to the patient.  She seems to  comprehend my treatment plan well.  There will be extra effort by both professional staff as well as technical staff to coordinate and manage concurrent chemoradiation and ensuing side effects during her treatments.  We will coordinate her chemotherapy with medical oncology.  I would like to take this opportunity to thank you for allowing me to participate in the care of your patient.Noreene Filbert, MD

## 2019-01-22 NOTE — Progress Notes (Signed)
Patient here for follow up. No concerns voiced.  °

## 2019-01-23 DIAGNOSIS — C20 Malignant neoplasm of rectum: Secondary | ICD-10-CM | POA: Insufficient documentation

## 2019-01-23 DIAGNOSIS — Z800 Family history of malignant neoplasm of digestive organs: Secondary | ICD-10-CM | POA: Insufficient documentation

## 2019-01-23 DIAGNOSIS — Z8042 Family history of malignant neoplasm of prostate: Secondary | ICD-10-CM | POA: Insufficient documentation

## 2019-01-23 LAB — CEA: CEA: 456 ng/mL — ABNORMAL HIGH (ref 0.0–4.7)

## 2019-01-23 MED ORDER — CAPECITABINE 500 MG PO TABS: 1500.0000 mg | ORAL_TABLET | Freq: Two times a day (BID) | ORAL | 0 refills | Status: DC

## 2019-01-23 MED ORDER — CAPECITABINE 150 MG PO TABS: 150.0000 mg | ORAL_TABLET | Freq: Two times a day (BID) | ORAL | 0 refills | Status: DC

## 2019-01-23 MED ORDER — POTASSIUM CHLORIDE CRYS ER 20 MEQ PO TBCR: 20.0000 meq | EXTENDED_RELEASE_TABLET | Freq: Every day | ORAL | 0 refills | Status: DC

## 2019-01-23 NOTE — Progress Notes (Signed)
START ON PATHWAY REGIMEN - Colorectal     Administer Monday through Friday:     Capecitabine   **Always confirm dose/schedule in your pharmacy ordering system**  Patient Characteristics: Preoperative or Nonsurgical Candidate (Clinical Staging), Rectal, cT3 - cT4, cN0 or Any cT, cN+ Therapeutic Status: Preoperative or Nonsurgical Candidate (Clinical Staging) Tumor Location: Rectal AJCC T Category: cT4b AJCC N Category: cN1 AJCC M Category: cM0 AJCC 8 Stage Grouping: IIIC Intent of Therapy: Curative Intent, Discussed with Patient

## 2019-01-23 NOTE — Progress Notes (Addendum)
ALERT: Recent Pathways Treatment decision is outdated. Please await next Pathways decision Clinical Staging T4b N1 M0.

## 2019-01-24 ENCOUNTER — Other Ambulatory Visit: Payer: Self-pay

## 2019-01-24 ENCOUNTER — Telehealth: Payer: Self-pay | Admitting: Pharmacist

## 2019-01-24 NOTE — Telephone Encounter (Signed)
Oral Chemotherapy Pharmacist Encounter  Xeloda patient assistance application completed for [facility] in an effort to reduce patient's out of pocket expense for Xeloda to $0.    Application completed and faxed to [phone].   [employer] patient assistance phone number for follow up is [phone].   This encounter will be updated until final determination.   Darl Pikes, PharmD, BCPS, [facility] Hematology/Oncology Clinical Pharmacist [facility] [place] [facility] [phone]  01/24/2019 3:34 PM

## 2019-01-24 NOTE — Telephone Encounter (Signed)
Oral Chemotherapy Pharmacist Encounter   Copay:  500mg  tablets- $112.69 150mg  tablets- $18.95  Patient reports she can not afford this copay so we will proceed with manufacture assistance.  Darl Pikes, PharmD, BCPS, [facility] Hematology/Oncology Clinical Pharmacist [facility] [place] [facility] [phone]  01/24/2019 3:22 PM

## 2019-01-26 ENCOUNTER — Other Ambulatory Visit: Payer: Self-pay

## 2019-01-27 ENCOUNTER — Ambulatory Visit
Admission: RE | Admit: 2019-01-27 | Discharge: 2019-01-27 | Disposition: A | Discharge status: Home / Self Care | Payer: Medicare Other | Source: Ambulatory Visit | Attending: Radiation Oncology | Admitting: Radiation Oncology

## 2019-01-27 ENCOUNTER — Other Ambulatory Visit: Payer: Self-pay

## 2019-01-27 DIAGNOSIS — Z7901 Long term (current) use of anticoagulants: Secondary | ICD-10-CM | POA: Diagnosis not present

## 2019-01-27 DIAGNOSIS — Z79899 Other long term (current) drug therapy: Secondary | ICD-10-CM | POA: Insufficient documentation

## 2019-01-27 DIAGNOSIS — E7800 Pure hypercholesterolemia, unspecified: Secondary | ICD-10-CM | POA: Insufficient documentation

## 2019-01-27 DIAGNOSIS — Z510 Encounter for antineoplastic radiation therapy: Secondary | ICD-10-CM | POA: Insufficient documentation

## 2019-01-27 DIAGNOSIS — C20 Malignant neoplasm of rectum: Secondary | ICD-10-CM | POA: Diagnosis not present

## 2019-01-27 DIAGNOSIS — Z800 Family history of malignant neoplasm of digestive organs: Secondary | ICD-10-CM | POA: Diagnosis not present

## 2019-01-27 DIAGNOSIS — I10 Essential (primary) hypertension: Secondary | ICD-10-CM | POA: Diagnosis not present

## 2019-01-27 DIAGNOSIS — Z8042 Family history of malignant neoplasm of prostate: Secondary | ICD-10-CM | POA: Insufficient documentation

## 2019-01-27 MED ORDER — CAPECITABINE 500 MG PO TABS: 1500.0000 mg | ORAL_TABLET | Freq: Two times a day (BID) | ORAL | 0 refills | Status: DC

## 2019-01-27 MED ORDER — CAPECITABINE 150 MG PO TABS: 150.0000 mg | ORAL_TABLET | Freq: Two times a day (BID) | ORAL | 0 refills | Status: DC

## 2019-01-27 NOTE — Telephone Encounter (Signed)
Oral Chemotherapy Pharmacist Encounter  Received notification from [facility] Patient Assistance program that patient has been successfully enrolled into their program to receive Xeloda from the manufacturer at $0 out of pocket until therapy is discontinued, health insurance or financial status changes, or patient no longer meets the program eligibility.    I called and spoke with patient.  Patient knows we will have to re-apply.   Patient knows to call the office with questions or concerns.   Oral Oncology Clinic will continue to follow.  Darl Pikes, PharmD, BCPS, [facility] Hematology/Oncology Clinical Pharmacist [facility] [place] [facility] [phone]  01/27/2019 10:53 AM

## 2019-01-28 DIAGNOSIS — Z510 Encounter for antineoplastic radiation therapy: Secondary | ICD-10-CM | POA: Diagnosis not present

## 2019-01-28 NOTE — Telephone Encounter (Signed)
Oral Chemotherapy Pharmacist Encounter   Called [facility], the dispensing pharmacy to check on the status of the Xeloda prescription for [name]. They said that the prescription was ready and the patient was in the queue to be called today. They said [name] could call today to be set-up for medication shipment. I called [name] and asked her to give [facility] pharmacy a call (tele: [phone]). .    [name] called the pharmacy and medication will be delivered to her on Friday 01/31/2019. Provided patient with Xeloda patient education (see Xeloda new start 01/22/19 telephone note).  Darl Pikes, PharmD, BCPS, [facility] Hematology/Oncology Clinical Pharmacist [facility] [place] [facility] [phone]  01/28/2019 10:47 AM

## 2019-01-28 NOTE — Telephone Encounter (Signed)
Oral Chemotherapy Pharmacist Encounter  Xeloda will be delivered to Jacqueline Yoder on 01/31/19. She knows to start this medication with her first day of radiation.  Patient Education I spoke with patient for overview of new oral chemotherapy medication: Xeloda (capecitabine) for the treatment of advanced adenocarcinoma the rectum in conjunction with radiation, planned duration until the end of radiation therapy.  Pt is doing well. Counseled patient on administration, dosing, side effects, monitoring, drug-food interactions, safe handling, storage, and disposal. Patient will take 1 tablet (150 mg total) by mouth 2 (two) times daily after a meal. Take with three 500mg  tablets. Take Mon-Fri, only on radiation days.  Spoke with Jacqueline Yoder about DDI with pantoprazole. She plans to stop the pantoprazole on Sunday 02/02/19. She knows she can use famotidine for reflux management, if the change to famotidine does not manage her reflux she will let us know.  Side effects include but not limited to: diarrhea, hand-foot syndrome, N/V, fatigue, decrease in wbc/plt/hgb.    Reviewed with patient importance of keeping a medication schedule and plan for any missed doses.  Jacqueline Yoder voiced understanding and appreciation. All questions answered. Medication handout will be placed in the mail.  Provided patient with Oral [place] phone number. Patient knows to call the office with questions or concerns. Oral Chemotherapy Navigation Clinic will continue to follow.  Darl Pikes, PharmD, BCPS, [facility] Hematology/Oncology Clinical Pharmacist [facility] [place] [facility] [phone]  01/28/2019 11:17 AM

## 2019-01-31 ENCOUNTER — Other Ambulatory Visit: Payer: Self-pay | Admitting: *Deleted

## 2019-01-31 ENCOUNTER — Other Ambulatory Visit: Payer: Self-pay | Admitting: Oncology

## 2019-01-31 ENCOUNTER — Other Ambulatory Visit: Payer: Self-pay

## 2019-01-31 DIAGNOSIS — C20 Malignant neoplasm of rectum: Secondary | ICD-10-CM

## 2019-02-03 ENCOUNTER — Other Ambulatory Visit: Payer: Self-pay

## 2019-02-03 ENCOUNTER — Ambulatory Visit
Admission: RE | Admit: 2019-02-03 | Discharge: 2019-02-03 | Disposition: A | Discharge status: Home / Self Care | Payer: Medicare Other | Source: Ambulatory Visit | Attending: Radiation Oncology | Admitting: Radiation Oncology

## 2019-02-03 DIAGNOSIS — Z510 Encounter for antineoplastic radiation therapy: Secondary | ICD-10-CM | POA: Diagnosis not present

## 2019-02-04 ENCOUNTER — Ambulatory Visit
Admission: RE | Admit: 2019-02-04 | Discharge: 2019-02-04 | Disposition: A | Discharge status: Home / Self Care | Payer: Medicare Other | Source: Ambulatory Visit | Attending: Radiation Oncology | Admitting: Radiation Oncology

## 2019-02-04 ENCOUNTER — Other Ambulatory Visit: Payer: Self-pay

## 2019-02-04 ENCOUNTER — Telehealth: Payer: Self-pay | Admitting: Pharmacist

## 2019-02-04 DIAGNOSIS — Z510 Encounter for antineoplastic radiation therapy: Secondary | ICD-10-CM | POA: Diagnosis not present

## 2019-02-04 DIAGNOSIS — Z933 Colostomy status: Secondary | ICD-10-CM | POA: Insufficient documentation

## 2019-02-04 NOTE — Telephone Encounter (Signed)
Oral Chemotherapy Pharmacist Encounter  Dispensed samples to patient:  Medication: Loperamide 2mg  tablets Instructions: Follow the instructions on the box, Take 2 tablets after the first loose stool, 1 tablet after each subseqent loose stool, but no more then 4 tablets in 24 hours. Quantity dispensed: 72 tablets Manufacturer: [employer] Lot: [id number] Exp: 08/16/2019  Darl Pikes, PharmD, BCPS, [facility] Hematology/Oncology Clinical Pharmacist [facility] [place] [facility] [phone]  02/04/2019 8:48 AM

## 2019-02-05 ENCOUNTER — Ambulatory Visit
Admission: RE | Admit: 2019-02-05 | Discharge: 2019-02-05 | Disposition: A | Discharge status: Home / Self Care | Payer: Medicare Other | Source: Ambulatory Visit | Attending: Radiation Oncology | Admitting: Radiation Oncology

## 2019-02-05 ENCOUNTER — Other Ambulatory Visit: Payer: Self-pay

## 2019-02-05 DIAGNOSIS — Z510 Encounter for antineoplastic radiation therapy: Secondary | ICD-10-CM | POA: Diagnosis not present

## 2019-02-05 NOTE — Telephone Encounter (Signed)
Please schedule patient to see me Lab MD on 02/07/2019

## 2019-02-06 ENCOUNTER — Ambulatory Visit
Admission: RE | Admit: 2019-02-06 | Discharge: 2019-02-06 | Disposition: A | Discharge status: Home / Self Care | Payer: Medicare Other | Source: Ambulatory Visit | Attending: Radiation Oncology | Admitting: Radiation Oncology

## 2019-02-06 ENCOUNTER — Other Ambulatory Visit: Payer: Self-pay

## 2019-02-06 ENCOUNTER — Encounter (HOSPITAL_COMMUNITY): Payer: Self-pay

## 2019-02-06 DIAGNOSIS — Z510 Encounter for antineoplastic radiation therapy: Secondary | ICD-10-CM | POA: Diagnosis not present

## 2019-02-07 ENCOUNTER — Ambulatory Visit
Admission: RE | Admit: 2019-02-07 | Discharge: 2019-02-07 | Disposition: A | Discharge status: Home / Self Care | Payer: Medicare Other | Source: Ambulatory Visit | Attending: Radiation Oncology | Admitting: Radiation Oncology

## 2019-02-07 ENCOUNTER — Inpatient Hospital Stay (HOSPITAL_BASED_OUTPATIENT_CLINIC_OR_DEPARTMENT_OTHER): Payer: Medicare Other | Admitting: Oncology

## 2019-02-07 ENCOUNTER — Encounter: Payer: Self-pay | Admitting: Oncology

## 2019-02-07 ENCOUNTER — Other Ambulatory Visit: Payer: Self-pay

## 2019-02-07 ENCOUNTER — Inpatient Hospital Stay: Payer: Medicare Other

## 2019-02-07 VITALS — BP 131/77 | HR 89 | Temp 96.6°F | Resp 18 | Wt 213.9 lb

## 2019-02-07 DIAGNOSIS — I700 Atherosclerosis of aorta: Secondary | ICD-10-CM

## 2019-02-07 DIAGNOSIS — Z510 Encounter for antineoplastic radiation therapy: Secondary | ICD-10-CM | POA: Diagnosis not present

## 2019-02-07 DIAGNOSIS — Z8042 Family history of malignant neoplasm of prostate: Secondary | ICD-10-CM

## 2019-02-07 DIAGNOSIS — Z8542 Personal history of malignant neoplasm of other parts of uterus: Secondary | ICD-10-CM

## 2019-02-07 DIAGNOSIS — Z79899 Other long term (current) drug therapy: Secondary | ICD-10-CM

## 2019-02-07 DIAGNOSIS — I10 Essential (primary) hypertension: Secondary | ICD-10-CM

## 2019-02-07 DIAGNOSIS — R634 Abnormal weight loss: Secondary | ICD-10-CM | POA: Diagnosis not present

## 2019-02-07 DIAGNOSIS — E7800 Pure hypercholesterolemia, unspecified: Secondary | ICD-10-CM | POA: Diagnosis not present

## 2019-02-07 DIAGNOSIS — Z800 Family history of malignant neoplasm of digestive organs: Secondary | ICD-10-CM

## 2019-02-07 DIAGNOSIS — C20 Malignant neoplasm of rectum: Secondary | ICD-10-CM | POA: Diagnosis not present

## 2019-02-07 DIAGNOSIS — Z5111 Encounter for antineoplastic chemotherapy: Secondary | ICD-10-CM

## 2019-02-07 DIAGNOSIS — Z87891 Personal history of nicotine dependence: Secondary | ICD-10-CM

## 2019-02-07 LAB — CBC WITH DIFFERENTIAL/PLATELET
Abs Immature Granulocytes: 0.01 10*3/uL (ref 0.00–0.07)
Basophils Absolute: 0 10*3/uL (ref 0.0–0.1)
Basophils Relative: 1 %
Eosinophils Absolute: 0.1 10*3/uL (ref 0.0–0.5)
Eosinophils Relative: 3 %
HCT: 37.9 % (ref 36.0–46.0)
Hemoglobin: 12.3 g/dL (ref 12.0–15.0)
Immature Granulocytes: 0 %
Lymphocytes Relative: 37 %
Lymphs Abs: 1.5 10*3/uL (ref 0.7–4.0)
MCH: 30.3 pg (ref 26.0–34.0)
MCHC: 32.5 g/dL (ref 30.0–36.0)
MCV: 93.3 fL (ref 80.0–100.0)
Monocytes Absolute: 0.4 10*3/uL (ref 0.1–1.0)
Monocytes Relative: 9 %
Neutro Abs: 2 10*3/uL (ref 1.7–7.7)
Neutrophils Relative %: 50 %
Platelets: 250 10*3/uL (ref 150–400)
RBC: 4.06 MIL/uL (ref 3.87–5.11)
RDW: 14.4 % (ref 11.5–15.5)
WBC: 4.1 10*3/uL (ref 4.0–10.5)
nRBC: 0 % (ref 0.0–0.2)

## 2019-02-07 LAB — COMPREHENSIVE METABOLIC PANEL
ALT: 17 U/L (ref 0–44)
AST: 25 U/L (ref 15–41)
Albumin: 3.9 g/dL (ref 3.5–5.0)
Alkaline Phosphatase: 34 U/L — ABNORMAL LOW (ref 38–126)
Anion gap: 9 (ref 5–15)
BUN: 11 mg/dL (ref 8–23)
CO2: 26 mmol/L (ref 22–32)
Calcium: 9.3 mg/dL (ref 8.9–10.3)
Chloride: 104 mmol/L (ref 98–111)
Creatinine, Ser: 0.88 mg/dL (ref 0.44–1.00)
GFR calc Af Amer: >60 mL/min (ref >60)
GFR calc non Af Amer: >60 mL/min (ref >60)
Glucose, Bld: 97 mg/dL (ref 70–99)
Potassium: 3.7 mmol/L (ref 3.5–5.1)
Sodium: 139 mmol/L (ref 135–145)
Total Bilirubin: 0.6 mg/dL (ref 0.3–1.2)
Total Protein: 6.8 g/dL (ref 6.5–8.1)

## 2019-02-07 NOTE — Progress Notes (Signed)
Patient here for follow up.  Pt had colostomy bag placement on 3/23. Pt states everything has been well, no leaking or irritation from bag.

## 2019-02-08 NOTE — Progress Notes (Signed)
Hematology/Oncology progress note  [facility] [phone] [phone]   Patient Care Team: Anderson, Marshall W, MD as PCP - General (Internal Medicine) Stanton, Kristi D, RN as Registered Nurse  REFERRING PROVIDER: Dr.Secord CHIEF COMPLAINTS/REASON FOR VISIT:  Evaluation of endometrial cancer  HISTORY OF PRESENTING ILLNESS:  Jacqueline Yoder is a  73 y.o.  female with PMH listed below who was referred to me for evaluation of endometrial cancer.   Patient initially presented with complaints of postmenopausal bleeding on 08/16/2018.  History of was menopausal vaginal bleeding in 2016 which resulted in cervical polypectomy.  Pathology 02/04/2015 showed cervical polyp, consistent with benign endometrial polyp.  [name] after polypectomy due to anxiety associated with pelvic exams.  pelvic exam on 08/16/2018 reviewed cervical abnormality and from enlarged uterus. Seen by Dr. Cherry on 10/29/2018.  Endometrial biopsy and a Pap smear was performed. 10/29/2018 Pap smear showed adenocarcinoma, favor endometrial origin. 10/29/2018 endometrial biopsy showed endometrioid carcinoma, FIGO grade 1.  10/29/2018- TA & TV Ultrasound revealed: Anteverted uterus measuring 8.7 x 5.6 x 6.4 cm without evidence of focal masses.  The endometrium measuring 24.1 mm (thickened) and heterogeneous.  Right and left ovaries not visualized.  No adnexal masses identified.  No free fluid in cul-de-sac.  Patient was seen by Dr. Secord in clinic on 11/13/2018.  Cervical exam reveals 2 cm exophytic irregular mass consistent with malignancy.   11/19/2018 CT chest abdomen pelvis with contrast showed thickened endometrium with some irregularity compatible with the provided diagnosis of endometrial malignancy.  There is a mildly prominent left inguinal node 1.4 cm.  Patient was seen by Dr. Berchuck on 11/20/2018 and left groin lymph node biopsy was recommended.  11/26/2018 patient  underwent left inguinal lymph node biopsy. Pathology showed metastatic adenocarcinoma consistent with colorectal origin.  CDX 2+.  Case was discussed on tumor board.  Recommend colonoscopy for further evaluation.  Patient reports significant weight loss 30 pounds over the last year.  Chronic vaginal spotting. Change of bowel habits the past few months.  More constipated.  Family history positive for brother who has colon cancer prostate cancer.  patient has underwent colonoscopy on 12/03/2018 which reviewed a nonobstructing large mass in the rectum.  Also chronic fistula.  Mass was not circumferential.  This was biopsied with a cold forceps for histology.  Pathology came back hyperplastic polyp negative for dysplasia and malignancy. [name], patient underwent flex sigmoidoscopy on 12/06/2018 with rebiopsy of the rectal mass. This time biopsy results came back positive for invasive colorectal adenocarcinoma, moderately differentiated. Immunotherapy for nearly mismatch repair protein (MMR ) was performed.  There is no loss of MMR expression.  low probability of MSI high.   # Seen by [name] for evaluation of resectability for rectal cancer. In addition, she also had a second opinion with [facility] pathology where her endometrial biopsy pathology was changed to  adenocarcinoma, consistent with colorectal primary.   [name]. She has home health that has been assisting with ostomy care  Patient was also evaluated by [facility] oncology.  Recommendation is to proceed with TNT with concurrent chemoradiation followed by neoadjuvant chemotherapy followed by surgical resection. Patient prefers to have treatment done locally with [facility]. 02/03/2019 started concurrent Xeloda and radiation.  Xeloda dose 825mg /m2 BID - rounded to 1650mg BID- on days of radiation.  INTERVAL HISTORY Jacqueline Yoder is a 73 y.o. female who has above history reviewed by me  today   presents for follow up visit for evaluation of tolerability of chemotherapy Xeloda. Patient has started concurrent Xeloda and radiation on 02/03/2019. She reports feeling well today she has not experienced any significant toxicities from Xeloda.  Denies any nausea, vomiting, or increased watery colostomy output. We discussed about the instruction of Xeloda and she has been taking it appropriately.  She knows only taking Xeloda on the days of radiation.  She has no concerns today about her colostomy.  Normal output.  Denies any pain or stoma concerns.  Denies any fever, chills, cough, sore throat, chest pain, abdominal pain or skin changes.  Her appetite is fair.  She has gained 3 pounds since last visit.   Review of Systems  Constitutional: Positive for fatigue. Negative for appetite change, chills, fever and unexpected weight change.  HENT:   Negative for hearing loss and voice change.   Eyes: Negative for eye problems.  Respiratory: Negative for chest tightness and cough.   Cardiovascular: Negative for chest pain.  Gastrointestinal: Negative for abdominal distention, abdominal pain, blood in stool and constipation.  Endocrine: Negative for hot flashes.  Genitourinary: Negative for difficulty urinating and frequency.   Musculoskeletal: Negative for arthralgias.  Skin: Negative for itching and rash.  Neurological: Negative for extremity weakness.  Hematological: Negative for adenopathy.  Psychiatric/Behavioral: Negative for confusion.    MEDICAL HISTORY:  Past Medical History:  Diagnosis Date  . Hypercholesteremia   . Hypertension   . Hypertension   . Urinary incontinence     SURGICAL HISTORY: Past Surgical History:  Procedure Laterality Date  . CHOLECYSTECTOMY  1971  . COLONOSCOPY WITH PROPOFOL N/A 12/03/2018   Procedure: COLONOSCOPY WITH PROPOFOL;  Surgeon: Wohl, Darren, MD;  Location: [facility] ENDOSCOPY;  Service: Endoscopy;  Laterality: N/A;  . FLEXIBLE SIGMOIDOSCOPY N/A  12/06/2018   Procedure: FLEXIBLE SIGMOIDOSCOPY;  Surgeon: Anna, Kiran, MD;  Location: [facility] ENDOSCOPY;  Service: Endoscopy;  Laterality: N/A;    SOCIAL HISTORY: Social History   Socioeconomic History  . Marital status: Married    Spouse name: Not on file  . Number of children: Not on file  . Years of education: Not on file  . Highest education level: Not on file  Occupational History  . Not on file  Social Needs  . Financial resource strain: Not on file  . Food insecurity:    Worry: Not on file    Inability: Not on file  . Transportation needs:    Medical: Not on file    Non-medical: Not on file  Tobacco Use  . Smoking status: Former Smoker    Last attempt to quit: 12/02/1977    Years since quitting: 41.2  . Smokeless tobacco: Never Used  Substance and Sexual Activity  . Alcohol use: Never    Frequency: Never  . Drug use: Never  . Sexual activity: Not Currently    Birth control/protection: None  Lifestyle  . Physical activity:    Days per week: Not on file    Minutes per session: Not on file  . Stress: Not on file  Relationships  . Social connections:    Talks on phone: Not on file    Gets together: Not on file    Attends religious service: Not on file    Active member of club or organization: Not on file    Attends meetings of clubs or organizations: Not on file    Relationship status: Not on file  . Intimate partner violence:    Fear of current or ex   partner: Not on file    Emotionally abused: Not on file    Physically abused: Not on file    Forced sexual activity: Not on file  Other Topics Concern  . Not on file  Social History Narrative  . Not on file    FAMILY HISTORY: Family History  Problem Relation Age of Onset  . Colon cancer Brother   . Prostate cancer Brother   . Hypertension Mother   . Kidney failure Father   . Breast cancer Neg Hx   . Ovarian cancer Neg Hx     ALLERGIES:  is allergic to sulfamethoxazole-trimethoprim.  MEDICATIONS:   Current Outpatient Medications  Medication Sig Dispense Refill  . capecitabine (XELODA) 150 MG tablet Take 1 tablet (150 mg total) by mouth 2 (two) times daily after a meal. Take with three 51m tablets. Take Mon-Fri, only on radiation days. 62 tablet 0  . capecitabine (XELODA) 500 MG tablet Take 3 tablets (1,500 mg total) by mouth 2 (two) times daily after a meal. Take with one 1531mtablet. Take Mon-Fri, only on radiation days. 186 tablet 0  . Cholecalciferol (VITAMIN D3) 2000 units capsule Take 2,000 Units by mouth daily.    . diclofenac sodium (VOLTAREN) 1 % GEL Apply topically as needed.  11  . docusate sodium (COLACE) 100 MG capsule Take by mouth.    . enoxaparin (LOVENOX) 40 MG/0.4ML injection INJECT 0.4ML (40NG TOTAL) SUBCUTANEOUSLY ONCE DAILY FOR 28 DAYS    . fexofenadine (ALLEGRA) 180 MG tablet Take 180 mg by mouth daily.    . fluticasone (FLONASE) 50 MCG/ACT nasal spray 50 sprays daily.    . Marland Kitchenetorolac (ACULAR) 0.4 % SOLN Apply 4 drops to eye 2 (two) times daily.    . Lutein 40 MG CAPS Take by mouth.    . Multiple Vitamins-Minerals (ONE-A-DAY WOMENS 50 PLUS PO) Take by mouth.    . potassium chloride SA (K-DUR,KLOR-CON) 20 MEQ tablet Take 1 tablet (20 mEq total) by mouth daily. 3 tablet 0  . prednisoLONE acetate (PRED FORTE) 1 % ophthalmic suspension 1 drop.  1  . simvastatin (ZOCOR) 40 MG tablet Take 40 mg by mouth daily.    . Marland Kitchenriamterene-hydrochlorothiazide (DYAZIDE) 37.5-25 MG capsule Take by mouth daily.    . Na Sulfate-K Sulfate-Mg Sulf (SUPREP BOWEL PREP KIT) 17.5-3.13-1.6 GM/177ML SOLN Take 1 kit by mouth as directed. (Patient not taking: Reported on 12/02/2018) 1 Bottle 0  . pantoprazole (PROTONIX) 40 MG tablet Take 40 mg by mouth as needed.    . potassium chloride (K-DUR) 10 MEQ tablet Take 10 mEq by mouth daily.     No current facility-administered medications for this visit.      PHYSICAL EXAMINATION: ECOG PERFORMANCE STATUS: 1 - Symptomatic but completely ambulatory  Vitals:   02/07/19 1029  BP: 131/77  Pulse: 89  Resp: 18  Temp: (!) 96.6 F (35.9 C)   Filed Weights   02/07/19 1029  Weight: 213 lb 14.4 oz (97 kg)    Physical Exam Constitutional:      General: She is not in acute distress. HENT:     Head: Normocephalic and atraumatic.  Eyes:     General: No scleral icterus.    Pupils: Pupils are equal, round, and reactive to light.  Neck:     Musculoskeletal: Normal range of motion and neck supple.  Cardiovascular:     Rate and Rhythm: Normal rate and regular rhythm.     Heart sounds: Normal heart sounds.  Pulmonary:  Effort: Pulmonary effort is normal. No respiratory distress.     Breath sounds: No wheezing.  Abdominal:     General: Bowel sounds are normal. There is no distension.     Palpations: Abdomen is soft. There is no mass.     Tenderness: There is no abdominal tenderness.     Comments: + colostomy bag.   Musculoskeletal: Normal range of motion.        General: No deformity.  Skin:    General: Skin is warm and dry.     Findings: No erythema or rash.  Neurological:     Mental Status: She is alert and oriented to person, place, and time.     Cranial Nerves: No cranial nerve deficit.     Coordination: Coordination normal.  Psychiatric:        Behavior: Behavior normal.        Thought Content: Thought content normal.     RADIOGRAPHIC STUDIES: I have personally reviewed the radiological images as listed and agreed with the findings in the report.  CMP Latest Ref Rng & Units 02/07/2019  Glucose 70 - 99 mg/dL 97  BUN 8 - 23 mg/dL 11  Creatinine 0.44 - 1.00 mg/dL 0.88  Sodium 135 - 145 mmol/L 139  Potassium 3.5 - 5.1 mmol/L 3.7  Chloride 98 - 111 mmol/L 104  CO2 22 - 32 mmol/L 26  Calcium 8.9 - 10.3 mg/dL 9.3  Total Protein 6.5 - 8.1 g/dL 6.8  Total Bilirubin 0.3 - 1.2 mg/dL 0.6  Alkaline Phos 38 - 126 U/L 34(L)  AST 15 - 41 U/L 25  ALT 0 - 44 U/L 17   CBC Latest Ref Rng & Units 02/07/2019  WBC 4.0 - 10.5 K/uL  4.1  Hemoglobin 12.0 - 15.0 g/dL 12.3  Hematocrit 36.0 - 46.0 % 37.9  Platelets 150 - 400 K/uL 250    LABORATORY DATA:  I have reviewed the data as listed Lab Results  Component Value Date   WBC 4.1 02/07/2019   HGB 12.3 02/07/2019   HCT 37.9 02/07/2019   MCV 93.3 02/07/2019   PLT 250 02/07/2019   Recent Labs    12/02/18 1139 01/22/19 1131 02/07/19 1153  NA 139 139 139  K 3.1* 3.3* 3.7  CL 103 102 104  CO2 26 27 26  GLUCOSE 110* 104* 97  BUN 11 7* 11  CREATININE 1.11* 0.98 0.88  CALCIUM 9.4 9.5 9.3  GFRNONAA 50* 58* >60  GFRAA 58* >60 >60  PROT 7.2 7.2 6.8  ALBUMIN 4.2 4.2 3.9  AST 28 31 25  ALT 19 23 17  ALKPHOS 32* 34* 34*  BILITOT 0.5 0.5 0.6   Iron/TIBC/Ferritin/ %Sat No results found for: IRON, TIBC, FERRITIN, IRONPCTSAT   RADIOGRAPHIC STUDIES: I have personally reviewed the radiological images as listed and agreed with the findings in the report. #11/19/2018 CT chest abdomen pelvis chest 1. Thickened endometrium with some irregularity compatible with the provided diagnosis of endometrial malignancy. There is a mildly prominent left inguinal lymph node at 1.4 cm in short axis which may reflect nodal spread. 2. Bilateral cystic renal lesions. We do not have precontrast images in order to be able to assigned Bosniak classifications. No significant degree of de-enhancement to favor solid mass. 3. Aortic Atherosclerosis (ICD10-I70.0). Coronary atherosclerosis. Sigmoid colon diverticulosis, with several areas of mild colon wall thickening probably from contraction. Correlation with the patient's colon cancer screening history is recommended. If screening is not up-to-date, appropriate screening should be considered. 4.   Lumbar impingement at L3-4 and L4-5.5. Esophageal reflux versus dysmotility.  ASSESSMENT & PLAN:  1. Rectal cancer ([place])   2. Encounter for antineoplastic chemotherapy   3. Family history of prostate cancer   4. Family history of colon cancer    Cancer Staging Rectal cancer [facility]) Staging form: Colon and Rectum, AJCC 8th Edition - Clinical stage from 01/23/2019: Stage IIIC (cT4b, cN1a, cM0) - Signed by Earlie Server, MD on 01/23/2019  Labs are reviewed and discussed with patient.  So far she tolerates concurrent Xeloda and radiation well. We discussed again about potential side effects from Xeloda. Recommend patient to keep her palms and feet moisturized.  Recommend [place] cream if she develops skin peeling.  She voices understanding. Continue Xeloda826m /m2 BID - rounded to 16523mBID- on days of radiation. # Hypokalemia, potassium has normalized.  No need for additional potassium pills.  #Noticed family history of colon cancer, prostate cancer in brother, she has personal history of endometrial cancer and pending further work-up for possible colorectal malignancy.  Discussed about genetic testing.   Patient is family are still thinking about genetic testing.  They will update me.  We spent sufficient time to discuss many aspect of care, questions were answered to patient's satisfaction. Return of visit: 2 weeks, with lab and MD assessment for chemotherapy.   ZhEarlie ServerMD, PhD Hematology Oncology [facility] LLC[facility]- [phone]/05/2019

## 2019-02-10 ENCOUNTER — Other Ambulatory Visit: Payer: Self-pay

## 2019-02-10 ENCOUNTER — Ambulatory Visit
Admission: RE | Admit: 2019-02-10 | Discharge: 2019-02-10 | Disposition: A | Discharge status: Home / Self Care | Payer: Medicare Other | Source: Ambulatory Visit | Attending: Radiation Oncology | Admitting: Radiation Oncology

## 2019-02-10 DIAGNOSIS — Z510 Encounter for antineoplastic radiation therapy: Secondary | ICD-10-CM | POA: Diagnosis not present

## 2019-02-11 ENCOUNTER — Other Ambulatory Visit: Payer: Self-pay

## 2019-02-11 ENCOUNTER — Ambulatory Visit
Admission: RE | Admit: 2019-02-11 | Discharge: 2019-02-11 | Disposition: A | Discharge status: Home / Self Care | Payer: Medicare Other | Source: Ambulatory Visit | Attending: Radiation Oncology | Admitting: Radiation Oncology

## 2019-02-11 DIAGNOSIS — Z510 Encounter for antineoplastic radiation therapy: Secondary | ICD-10-CM | POA: Diagnosis not present

## 2019-02-12 ENCOUNTER — Ambulatory Visit
Admission: RE | Admit: 2019-02-12 | Discharge: 2019-02-12 | Disposition: A | Discharge status: Home / Self Care | Payer: Medicare Other | Source: Ambulatory Visit | Attending: Radiation Oncology | Admitting: Radiation Oncology

## 2019-02-12 ENCOUNTER — Other Ambulatory Visit: Payer: Self-pay

## 2019-02-12 DIAGNOSIS — Z510 Encounter for antineoplastic radiation therapy: Secondary | ICD-10-CM | POA: Diagnosis not present

## 2019-02-13 ENCOUNTER — Inpatient Hospital Stay: Payer: Medicare Other

## 2019-02-13 ENCOUNTER — Ambulatory Visit
Admission: RE | Admit: 2019-02-13 | Discharge: 2019-02-13 | Disposition: A | Discharge status: Home / Self Care | Payer: Medicare Other | Source: Ambulatory Visit | Attending: Radiation Oncology | Admitting: Radiation Oncology

## 2019-02-13 ENCOUNTER — Other Ambulatory Visit: Payer: Self-pay

## 2019-02-13 DIAGNOSIS — Z510 Encounter for antineoplastic radiation therapy: Secondary | ICD-10-CM | POA: Diagnosis not present

## 2019-02-14 ENCOUNTER — Ambulatory Visit
Admission: RE | Admit: 2019-02-14 | Discharge: 2019-02-14 | Disposition: A | Discharge status: Home / Self Care | Payer: Medicare Other | Source: Ambulatory Visit | Attending: Radiation Oncology | Admitting: Radiation Oncology

## 2019-02-14 ENCOUNTER — Other Ambulatory Visit: Payer: Self-pay

## 2019-02-14 ENCOUNTER — Inpatient Hospital Stay: Payer: Medicare Other | Attending: Radiation Oncology

## 2019-02-14 DIAGNOSIS — I10 Essential (primary) hypertension: Secondary | ICD-10-CM | POA: Insufficient documentation

## 2019-02-14 DIAGNOSIS — Z800 Family history of malignant neoplasm of digestive organs: Secondary | ICD-10-CM | POA: Insufficient documentation

## 2019-02-14 DIAGNOSIS — Z79899 Other long term (current) drug therapy: Secondary | ICD-10-CM | POA: Insufficient documentation

## 2019-02-14 DIAGNOSIS — Z8042 Family history of malignant neoplasm of prostate: Secondary | ICD-10-CM | POA: Insufficient documentation

## 2019-02-14 DIAGNOSIS — Z7901 Long term (current) use of anticoagulants: Secondary | ICD-10-CM | POA: Diagnosis not present

## 2019-02-14 DIAGNOSIS — Z510 Encounter for antineoplastic radiation therapy: Secondary | ICD-10-CM | POA: Diagnosis present

## 2019-02-14 DIAGNOSIS — E7800 Pure hypercholesterolemia, unspecified: Secondary | ICD-10-CM | POA: Diagnosis not present

## 2019-02-14 DIAGNOSIS — C20 Malignant neoplasm of rectum: Secondary | ICD-10-CM | POA: Insufficient documentation

## 2019-02-14 LAB — CBC
HCT: 36.9 % (ref 36.0–46.0)
Hemoglobin: 11.9 g/dL — ABNORMAL LOW (ref 12.0–15.0)
MCH: 30.7 pg (ref 26.0–34.0)
MCHC: 32.2 g/dL (ref 30.0–36.0)
MCV: 95.1 fL (ref 80.0–100.0)
Platelets: 237 10*3/uL (ref 150–400)
RBC: 3.88 MIL/uL (ref 3.87–5.11)
RDW: 14.4 % (ref 11.5–15.5)
WBC: 3.8 10*3/uL — ABNORMAL LOW (ref 4.0–10.5)
nRBC: 0 % (ref 0.0–0.2)

## 2019-02-17 ENCOUNTER — Other Ambulatory Visit: Payer: Self-pay

## 2019-02-17 ENCOUNTER — Ambulatory Visit
Admission: RE | Admit: 2019-02-17 | Discharge: 2019-02-17 | Disposition: A | Discharge status: Home / Self Care | Payer: Medicare Other | Source: Ambulatory Visit | Attending: Radiation Oncology | Admitting: Radiation Oncology

## 2019-02-17 DIAGNOSIS — Z510 Encounter for antineoplastic radiation therapy: Secondary | ICD-10-CM | POA: Diagnosis not present

## 2019-02-18 ENCOUNTER — Other Ambulatory Visit: Payer: Self-pay

## 2019-02-18 ENCOUNTER — Ambulatory Visit
Admission: RE | Admit: 2019-02-18 | Discharge: 2019-02-18 | Disposition: A | Discharge status: Home / Self Care | Payer: Medicare Other | Source: Ambulatory Visit | Attending: Radiation Oncology | Admitting: Radiation Oncology

## 2019-02-18 DIAGNOSIS — Z510 Encounter for antineoplastic radiation therapy: Secondary | ICD-10-CM | POA: Diagnosis not present

## 2019-02-19 ENCOUNTER — Other Ambulatory Visit: Payer: Self-pay

## 2019-02-19 ENCOUNTER — Ambulatory Visit
Admission: RE | Admit: 2019-02-19 | Discharge: 2019-02-19 | Disposition: A | Discharge status: Home / Self Care | Payer: Medicare Other | Source: Ambulatory Visit | Attending: Radiation Oncology | Admitting: Radiation Oncology

## 2019-02-19 DIAGNOSIS — Z510 Encounter for antineoplastic radiation therapy: Secondary | ICD-10-CM | POA: Diagnosis not present

## 2019-02-20 ENCOUNTER — Other Ambulatory Visit: Payer: Self-pay

## 2019-02-20 ENCOUNTER — Ambulatory Visit
Admission: RE | Admit: 2019-02-20 | Discharge: 2019-02-20 | Disposition: A | Discharge status: Home / Self Care | Payer: Medicare Other | Source: Ambulatory Visit | Attending: Radiation Oncology | Admitting: Radiation Oncology

## 2019-02-20 ENCOUNTER — Inpatient Hospital Stay: Payer: Medicare Other | Attending: Oncology

## 2019-02-20 ENCOUNTER — Encounter: Payer: Self-pay | Admitting: Oncology

## 2019-02-20 ENCOUNTER — Inpatient Hospital Stay (HOSPITAL_BASED_OUTPATIENT_CLINIC_OR_DEPARTMENT_OTHER): Payer: Medicare Other | Admitting: Oncology

## 2019-02-20 DIAGNOSIS — C20 Malignant neoplasm of rectum: Secondary | ICD-10-CM

## 2019-02-20 DIAGNOSIS — Z8042 Family history of malignant neoplasm of prostate: Secondary | ICD-10-CM | POA: Diagnosis not present

## 2019-02-20 DIAGNOSIS — R634 Abnormal weight loss: Secondary | ICD-10-CM | POA: Insufficient documentation

## 2019-02-20 DIAGNOSIS — E876 Hypokalemia: Secondary | ICD-10-CM | POA: Insufficient documentation

## 2019-02-20 DIAGNOSIS — Z923 Personal history of irradiation: Secondary | ICD-10-CM | POA: Insufficient documentation

## 2019-02-20 DIAGNOSIS — Z809 Family history of malignant neoplasm, unspecified: Secondary | ICD-10-CM | POA: Diagnosis not present

## 2019-02-20 DIAGNOSIS — Z933 Colostomy status: Secondary | ICD-10-CM | POA: Insufficient documentation

## 2019-02-20 DIAGNOSIS — R197 Diarrhea, unspecified: Secondary | ICD-10-CM | POA: Insufficient documentation

## 2019-02-20 DIAGNOSIS — R110 Nausea: Secondary | ICD-10-CM | POA: Diagnosis not present

## 2019-02-20 DIAGNOSIS — E7800 Pure hypercholesterolemia, unspecified: Secondary | ICD-10-CM | POA: Diagnosis not present

## 2019-02-20 DIAGNOSIS — Z79899 Other long term (current) drug therapy: Secondary | ICD-10-CM | POA: Insufficient documentation

## 2019-02-20 DIAGNOSIS — Z800 Family history of malignant neoplasm of digestive organs: Secondary | ICD-10-CM | POA: Diagnosis not present

## 2019-02-20 DIAGNOSIS — Z510 Encounter for antineoplastic radiation therapy: Secondary | ICD-10-CM | POA: Diagnosis not present

## 2019-02-20 DIAGNOSIS — I10 Essential (primary) hypertension: Secondary | ICD-10-CM | POA: Insufficient documentation

## 2019-02-20 DIAGNOSIS — Z87891 Personal history of nicotine dependence: Secondary | ICD-10-CM | POA: Insufficient documentation

## 2019-02-20 DIAGNOSIS — Z5111 Encounter for antineoplastic chemotherapy: Secondary | ICD-10-CM | POA: Diagnosis not present

## 2019-02-20 DIAGNOSIS — C778 Secondary and unspecified malignant neoplasm of lymph nodes of multiple regions: Secondary | ICD-10-CM | POA: Diagnosis not present

## 2019-02-20 DIAGNOSIS — R5383 Other fatigue: Secondary | ICD-10-CM | POA: Diagnosis not present

## 2019-02-20 DIAGNOSIS — K5900 Constipation, unspecified: Secondary | ICD-10-CM | POA: Insufficient documentation

## 2019-02-20 DIAGNOSIS — Z9221 Personal history of antineoplastic chemotherapy: Secondary | ICD-10-CM | POA: Diagnosis not present

## 2019-02-20 LAB — CBC
HCT: 36.6 % (ref 36.0–46.0)
Hemoglobin: 12 g/dL (ref 12.0–15.0)
MCH: 31.1 pg (ref 26.0–34.0)
MCHC: 32.8 g/dL (ref 30.0–36.0)
MCV: 94.8 fL (ref 80.0–100.0)
Platelets: 191 10*3/uL (ref 150–400)
RBC: 3.86 MIL/uL — ABNORMAL LOW (ref 3.87–5.11)
RDW: 14.8 % (ref 11.5–15.5)
WBC: 3.1 10*3/uL — ABNORMAL LOW (ref 4.0–10.5)
nRBC: 0 % (ref 0.0–0.2)

## 2019-02-20 NOTE — Progress Notes (Signed)
Patient contacted for Doximity visit. Pt states that she had loose stools yesterday and took antidiarrheal and it helped. States she has some gel-like substance coming from vagina. Denies burning or stinging.

## 2019-02-20 NOTE — Progress Notes (Signed)
HEMATOLOGY-ONCOLOGY TeleHEALTH VISIT PROGRESS NOTE  I connected with Jacqueline Yoder on 02/20/19 at  9:45 AM EDT by video enabled telemedicine visit and verified that I am speaking with the correct person using two identifiers. I discussed the limitations, risks, security and privacy concerns of performing an evaluation and management service by telemedicine and the availability of in-person appointments. I also discussed with the patient that there may be a patient responsible charge related to this service. The patient expressed understanding and agreed to proceed.   Other persons participating in the visit and their role in the encounter:  Janeann Merl, RN, check in patient.   Patient's location: Home  Provider's location: Work Risk analyst Complaint: Follow-up for management concurrent chemotherapy with radiation for treatment of rectal cancer.   INTERVAL HISTORY Jacqueline Yoder is a 73 y.o. female who has above history reviewed by me today presents for follow up visit for management of concurrent chemotherapy with radiation for treatment of stage IIIc rectal cancer. Problems and complaints are listed below:  Patient has been on concurrent chemoradiation for treatment of rectal cancer.  She takesXeloda a 25 mg/m twice daily on the days of radiation. she reports feeling well today.  She is going to get her daily radiation.  Denies any nausea, vomiting, she did report one episode of increased watery colostomy output.  She takes Imodium and symptoms resolved. Denies any skin peeling or pain.  She mentions she has some vaginal discharges.  No burning sensations. Denies any fever, chills, cough, sore throat, chest pain, abdominal pain or skin changes.  Appetite is fair. Chronic fatigue, slightly worse.  Review of Systems  Constitutional: Positive for fatigue. Negative for appetite change, chills and fever.  HENT:   Negative for hearing loss and voice change.   Eyes: Negative for eye problems.   Respiratory: Negative for chest tightness and cough.   Cardiovascular: Negative for chest pain.  Gastrointestinal: Negative for abdominal distention, abdominal pain and blood in stool.  Endocrine: Negative for hot flashes.  Genitourinary: Negative for difficulty urinating and frequency.   Musculoskeletal: Negative for arthralgias.  Skin: Negative for itching and rash.  Neurological: Negative for extremity weakness.  Hematological: Negative for adenopathy.  Psychiatric/Behavioral: Negative for confusion.     Past Medical History:  Diagnosis Date  . Hypercholesteremia   . Hypertension   . Hypertension   . Urinary incontinence    Past Surgical History:  Procedure Laterality Date  . CHOLECYSTECTOMY  1971  . COLONOSCOPY WITH PROPOFOL N/A 12/03/2018   Procedure: COLONOSCOPY WITH PROPOFOL;  Surgeon: Lucilla Lame, MD;  Location[facility] END[facility]COPY;  Service: Endoscopy;  Laterality: N/A;  . FLEXIBLE SIGMOIDOSCOPY N/A 12/06/2018   Procedure: FLEXIBLE SIGMOIDOSCOPY;  Surgeon: [name], MD;  Location: [facility] ENDOSCOPY;  Service: Endoscopy;  Laterality: N/A;    Family History  Problem Relation Age of Onset  . Colon cancer Brother   . Prostate cancer Brother   . Hypertension Mother   . Kidney failure Father   . Breast cancer Neg Hx   . Ovarian cancer Neg Hx     Social History   Socioeconomic History  . Marital status: Married    Spouse name: Not on file  . Number of children: Not on file  . Years of education: Not on file  . Highest education level: Not on file  Occupational History  . Not on file  Social Needs  . Financial resource strain: Not on file  . Food insecurity:    Worry: Not on  file    Inability: Not on file  . Transportation needs:    Medical: Not on file    Non-medical: Not on file  Tobacco Use  . Smoking status: Former Smoker    Last attempt to quit: 12/02/1977    Years since quitting: 41.2  . Smokeless tobacco: Never Used  Substance and Sexual Activity  .  Alcohol use: Never    Frequency: Never  . Drug use: Never  . Sexual activity: Not Currently    Birth control/protection: None  Lifestyle  . Physical activity:    Days per week: Not on file    Minutes per session: Not on file  . Stress: Not on file  Relationships  . Social connections:    Talks on phone: Not on file    Gets together: Not on file    Attends religious service: Not on file    Active member of club or organization: Not on file    Attends meetings of clubs or organizations: Not on file    Relationship status: Not on file  . Intimate partner violence:    Fear of current or ex partner: Not on file    Emotionally abused: Not on file    Physically abused: Not on file    Forced sexual activity: Not on file  Other Topics Concern  . Not on file  Social History Narrative  . Not on file    Current Outpatient Medications on File Prior to Visit  Medication Sig Dispense Refill  . capecitabine (XELODA) 150 MG tablet Take 1 tablet (150 mg total) by mouth 2 (two) times daily after a meal. Take with three 563m tablets. Take Mon-Fri, only on radiation days. 62 tablet 0  . capecitabine (XELODA) 500 MG tablet Take 3 tablets (1,500 mg total) by mouth 2 (two) times daily after a meal. Take with one 1565mtablet. Take Mon-Fri, only on radiation days. 186 tablet 0  . Cholecalciferol (VITAMIN D3) 2000 units capsule Take 2,000 Units by mouth daily.    . diclofenac sodium (VOLTAREN) 1 % GEL Apply topically as needed.  11  . docusate sodium (COLACE) 100 MG capsule Take by mouth.    . enoxaparin (LOVENOX) 40 MG/0.4ML injection INJECT 0.4ML (40NG TOTAL) SUBCUTANEOUSLY ONCE DAILY FOR 28 DAYS    . fexofenadine (ALLEGRA) 180 MG tablet Take 180 mg by mouth daily.    . fluticasone (FLONASE) 50 MCG/ACT nasal spray 50 sprays daily.    . Marland Kitchenetorolac (ACULAR) 0.4 % SOLN Apply 4 drops to eye 2 (two) times daily.    . Lutein 40 MG CAPS Take by mouth.    . Multiple Vitamins-Minerals (ONE-A-DAY WOMENS 50  PLUS PO) Take by mouth.    . pantoprazole (PROTONIX) 40 MG tablet Take 40 mg by mouth as needed.    . potassium chloride SA (K-DUR,KLOR-CON) 20 MEQ tablet Take 1 tablet (20 mEq total) by mouth daily. 3 tablet 0  . prednisoLONE acetate (PRED FORTE) 1 % ophthalmic suspension 1 drop.  1  . simvastatin (ZOCOR) 40 MG tablet Take 40 mg by mouth daily.    . Marland Kitchenriamterene-hydrochlorothiazide (DYAZIDE) 37.5-25 MG capsule Take by mouth daily.    . Na Sulfate-K Sulfate-Mg Sulf (SUPREP BOWEL PREP KIT) 17.5-3.13-1.6 GM/177ML SOLN Take 1 kit by mouth as directed. (Patient not taking: Reported on 12/02/2018) 1 Bottle 0  . potassium chloride (K-DUR) 10 MEQ tablet Take 10 mEq by mouth daily.     No current facility-administered medications on file prior to visit.  Allergies  Allergen Reactions  . Sulfamethoxazole-Trimethoprim Other (See Comments)    Also said she had chills and pressure in nose.       Observations/Objective: There were no vitals filed for this visit. There is no height or weight on file to calculate BMI.  Pain level 0 Physical Exam  Constitutional: She is oriented to person, place, and time. No distress.  HENT:  Head: Normocephalic and atraumatic.  Pulmonary/Chest: Effort normal.  Neurological: She is alert and oriented to person, place, and time.  Psychiatric: Affect normal.    CBC    Component Value Date/Time   WBC 3.1 (L) 02/20/2019 1052   RBC 3.86 (L) 02/20/2019 1052   HGB 12.0 02/20/2019 1052   HCT 36.6 02/20/2019 1052   PLT 191 02/20/2019 1052   MCV 94.8 02/20/2019 1052   MCH 31.1 02/20/2019 1052   MCHC 32.8 02/20/2019 1052   RDW 14.8 02/20/2019 1052   LYMPHSABS 1.5 02/07/2019 1153   MONOABS 0.4 02/07/2019 1153   EOSABS 0.1 02/07/2019 1153   BASOSABS 0.0 02/07/2019 1153    CMP     Component Value Date/Time   NA 139 02/07/2019 1153   K 3.7 02/07/2019 1153   CL 104 02/07/2019 1153   CO2 26 02/07/2019 1153   GLUCOSE 97 02/07/2019 1153   BUN 11 02/07/2019  1153   CREATININE 0.88 02/07/2019 1153   CALCIUM 9.3 02/07/2019 1153   PROT 6.8 02/07/2019 1153   ALBUMIN 3.9 02/07/2019 1153   AST 25 02/07/2019 1153   ALT 17 02/07/2019 1153   ALKPHOS 34 (L) 02/07/2019 1153   BILITOT 0.6 02/07/2019 1153   GFRNONAA >60 02/07/2019 1153   GFRAA >60 02/07/2019 1153     Assessment and Plan: 1. Rectal cancer ([place])   2. Encounter for antineoplastic chemotherapy   3. Diarrhea, unspecified type   4. Family history of cancer    Cancer Staging Rectal cancer [facility]) Staging form: Colon and Rectum, AJCC 8th Edition - Clinical stage from 01/23/2019: Stage IIIC (cT4b, cN1a, cM0) - Signed by Earlie Server, MD on 01/23/2019  Labs are reviewed and discussed with patient.  So far she tolerates concurrent Xeloda with radiation well. She has experienced mild diarrhea-loose watery stool output in colostomy bag.  Recommend patient to continue use Imodium as needed. We discussed again about the other potential side effects of Xeloda. Recommend patient to keep her palms and feet moisturized.  Recommend utterly cream if she develops pain.  So far she denies any skin changes. Continue Xeloda 825/mill meter twice daily, rounded to 1650 mg twice daily on the days of radiation.  Family history of colon cancer, prostate cancer, personal history of colon cancer, recommend genetic testing.  Patient still considering.  Follow Up Instructions: 2 weeks with labs and MD assessment for chemotherapy.   I discussed the assessment and treatment plan with the patient. The patient was provided an opportunity to ask questions and all were answered. The patient agreed with the plan and demonstrated an understanding of the instructions.  The patient was advised to call back or seek an in-person evaluation if the symptoms worsen or if the condition fails to improve as anticipated.   Earlie Server, MD 02/20/2019 4:52 PM

## 2019-02-21 ENCOUNTER — Other Ambulatory Visit: Payer: Self-pay

## 2019-02-21 ENCOUNTER — Ambulatory Visit
Admission: RE | Admit: 2019-02-21 | Discharge: 2019-02-21 | Disposition: A | Discharge status: Home / Self Care | Payer: Medicare Other | Source: Ambulatory Visit | Attending: Radiation Oncology | Admitting: Radiation Oncology

## 2019-02-21 DIAGNOSIS — Z510 Encounter for antineoplastic radiation therapy: Secondary | ICD-10-CM | POA: Diagnosis not present

## 2019-02-22 DIAGNOSIS — Z5111 Encounter for antineoplastic chemotherapy: Secondary | ICD-10-CM | POA: Insufficient documentation

## 2019-02-24 ENCOUNTER — Ambulatory Visit
Admission: RE | Admit: 2019-02-24 | Discharge: 2019-02-24 | Disposition: A | Discharge status: Home / Self Care | Payer: Medicare Other | Source: Ambulatory Visit | Attending: Radiation Oncology | Admitting: Radiation Oncology

## 2019-02-24 DIAGNOSIS — Z510 Encounter for antineoplastic radiation therapy: Secondary | ICD-10-CM | POA: Diagnosis not present

## 2019-02-25 ENCOUNTER — Ambulatory Visit
Admission: RE | Admit: 2019-02-25 | Discharge: 2019-02-25 | Disposition: A | Discharge status: Home / Self Care | Payer: Medicare Other | Source: Ambulatory Visit | Attending: Radiation Oncology | Admitting: Radiation Oncology

## 2019-02-25 ENCOUNTER — Other Ambulatory Visit: Payer: Self-pay

## 2019-02-25 DIAGNOSIS — Z510 Encounter for antineoplastic radiation therapy: Secondary | ICD-10-CM | POA: Diagnosis not present

## 2019-02-26 ENCOUNTER — Ambulatory Visit
Admission: RE | Admit: 2019-02-26 | Discharge: 2019-02-26 | Disposition: A | Discharge status: Home / Self Care | Payer: Medicare Other | Source: Ambulatory Visit | Attending: Radiation Oncology | Admitting: Radiation Oncology

## 2019-02-26 ENCOUNTER — Other Ambulatory Visit: Payer: Self-pay

## 2019-02-26 DIAGNOSIS — Z510 Encounter for antineoplastic radiation therapy: Secondary | ICD-10-CM | POA: Diagnosis not present

## 2019-02-27 ENCOUNTER — Ambulatory Visit
Admission: RE | Admit: 2019-02-27 | Discharge: 2019-02-27 | Disposition: A | Discharge status: Home / Self Care | Payer: Medicare Other | Source: Ambulatory Visit | Attending: Radiation Oncology | Admitting: Radiation Oncology

## 2019-02-27 ENCOUNTER — Inpatient Hospital Stay: Payer: Medicare Other

## 2019-02-27 ENCOUNTER — Other Ambulatory Visit: Payer: Self-pay

## 2019-02-27 DIAGNOSIS — Z510 Encounter for antineoplastic radiation therapy: Secondary | ICD-10-CM | POA: Diagnosis not present

## 2019-02-27 DIAGNOSIS — C20 Malignant neoplasm of rectum: Secondary | ICD-10-CM

## 2019-02-27 LAB — CBC
HCT: 35 % — ABNORMAL LOW (ref 36.0–46.0)
Hemoglobin: 11.7 g/dL — ABNORMAL LOW (ref 12.0–15.0)
MCH: 31.5 pg (ref 26.0–34.0)
MCHC: 33.4 g/dL (ref 30.0–36.0)
MCV: 94.3 fL (ref 80.0–100.0)
Platelets: 183 10*3/uL (ref 150–400)
RBC: 3.71 MIL/uL — ABNORMAL LOW (ref 3.87–5.11)
RDW: 15.4 % (ref 11.5–15.5)
WBC: 3.6 10*3/uL — ABNORMAL LOW (ref 4.0–10.5)
nRBC: 0 % (ref 0.0–0.2)

## 2019-02-28 ENCOUNTER — Ambulatory Visit
Admission: RE | Admit: 2019-02-28 | Discharge: 2019-02-28 | Disposition: A | Discharge status: Home / Self Care | Payer: Medicare Other | Source: Ambulatory Visit | Attending: Radiation Oncology | Admitting: Radiation Oncology

## 2019-02-28 DIAGNOSIS — Z510 Encounter for antineoplastic radiation therapy: Secondary | ICD-10-CM | POA: Diagnosis not present

## 2019-03-03 ENCOUNTER — Ambulatory Visit
Admission: RE | Admit: 2019-03-03 | Discharge: 2019-03-03 | Disposition: A | Discharge status: Home / Self Care | Payer: Medicare Other | Source: Ambulatory Visit | Attending: Radiation Oncology | Admitting: Radiation Oncology

## 2019-03-03 ENCOUNTER — Other Ambulatory Visit: Payer: Self-pay

## 2019-03-03 DIAGNOSIS — Z510 Encounter for antineoplastic radiation therapy: Secondary | ICD-10-CM | POA: Diagnosis not present

## 2019-03-04 ENCOUNTER — Other Ambulatory Visit: Payer: Self-pay

## 2019-03-04 ENCOUNTER — Ambulatory Visit
Admission: RE | Admit: 2019-03-04 | Discharge: 2019-03-04 | Disposition: A | Discharge status: Home / Self Care | Payer: Medicare Other | Source: Ambulatory Visit | Attending: Radiation Oncology | Admitting: Radiation Oncology

## 2019-03-04 DIAGNOSIS — Z510 Encounter for antineoplastic radiation therapy: Secondary | ICD-10-CM | POA: Diagnosis not present

## 2019-03-05 ENCOUNTER — Ambulatory Visit
Admission: RE | Admit: 2019-03-05 | Discharge: 2019-03-05 | Disposition: A | Discharge status: Home / Self Care | Payer: Medicare Other | Source: Ambulatory Visit | Attending: Radiation Oncology | Admitting: Radiation Oncology

## 2019-03-05 ENCOUNTER — Other Ambulatory Visit: Payer: Self-pay

## 2019-03-05 ENCOUNTER — Inpatient Hospital Stay: Payer: Medicare Other

## 2019-03-05 DIAGNOSIS — Z5111 Encounter for antineoplastic chemotherapy: Secondary | ICD-10-CM

## 2019-03-05 DIAGNOSIS — C20 Malignant neoplasm of rectum: Secondary | ICD-10-CM | POA: Diagnosis not present

## 2019-03-05 DIAGNOSIS — Z510 Encounter for antineoplastic radiation therapy: Secondary | ICD-10-CM | POA: Diagnosis not present

## 2019-03-05 DIAGNOSIS — R197 Diarrhea, unspecified: Secondary | ICD-10-CM

## 2019-03-05 DIAGNOSIS — Z809 Family history of malignant neoplasm, unspecified: Secondary | ICD-10-CM

## 2019-03-05 LAB — COMPREHENSIVE METABOLIC PANEL
ALT: 18 U/L (ref 0–44)
AST: 28 U/L (ref 15–41)
Albumin: 3.9 g/dL (ref 3.5–5.0)
Alkaline Phosphatase: 35 U/L — ABNORMAL LOW (ref 38–126)
Anion gap: 13 (ref 5–15)
BUN: 9 mg/dL (ref 8–23)
CO2: 26 mmol/L (ref 22–32)
Calcium: 9.1 mg/dL (ref 8.9–10.3)
Chloride: 100 mmol/L (ref 98–111)
Creatinine, Ser: 0.92 mg/dL (ref 0.44–1.00)
GFR calc Af Amer: >60 mL/min (ref >60)
GFR calc non Af Amer: >60 mL/min (ref >60)
Glucose, Bld: 105 mg/dL — ABNORMAL HIGH (ref 70–99)
Potassium: 3 mmol/L — ABNORMAL LOW (ref 3.5–5.1)
Sodium: 139 mmol/L (ref 135–145)
Total Bilirubin: 0.6 mg/dL (ref 0.3–1.2)
Total Protein: 6.6 g/dL (ref 6.5–8.1)

## 2019-03-05 LAB — CBC WITH DIFFERENTIAL/PLATELET
Abs Immature Granulocytes: 0.03 10*3/uL (ref 0.00–0.07)
Basophils Absolute: 0 10*3/uL (ref 0.0–0.1)
Basophils Relative: 0 %
Eosinophils Absolute: 0.3 10*3/uL (ref 0.0–0.5)
Eosinophils Relative: 8 %
HCT: 34.5 % — ABNORMAL LOW (ref 36.0–46.0)
Hemoglobin: 11.4 g/dL — ABNORMAL LOW (ref 12.0–15.0)
Immature Granulocytes: 1 %
Lymphocytes Relative: 7 %
Lymphs Abs: 0.3 10*3/uL — ABNORMAL LOW (ref 0.7–4.0)
MCH: 31.4 pg (ref 26.0–34.0)
MCHC: 33 g/dL (ref 30.0–36.0)
MCV: 95 fL (ref 80.0–100.0)
Monocytes Absolute: 0.4 10*3/uL (ref 0.1–1.0)
Monocytes Relative: 11 %
Neutro Abs: 2.8 10*3/uL (ref 1.7–7.7)
Neutrophils Relative %: 73 %
Platelets: 142 10*3/uL — ABNORMAL LOW (ref 150–400)
RBC: 3.63 MIL/uL — ABNORMAL LOW (ref 3.87–5.11)
RDW: 15.9 % — ABNORMAL HIGH (ref 11.5–15.5)
WBC: 3.9 10*3/uL — ABNORMAL LOW (ref 4.0–10.5)
nRBC: 0.5 % — ABNORMAL HIGH (ref 0.0–0.2)

## 2019-03-06 ENCOUNTER — Ambulatory Visit
Admission: RE | Admit: 2019-03-06 | Discharge: 2019-03-06 | Disposition: A | Discharge status: Home / Self Care | Payer: Medicare Other | Source: Ambulatory Visit | Attending: Radiation Oncology | Admitting: Radiation Oncology

## 2019-03-06 ENCOUNTER — Other Ambulatory Visit: Payer: Self-pay

## 2019-03-06 ENCOUNTER — Encounter: Payer: Self-pay | Admitting: Oncology

## 2019-03-06 ENCOUNTER — Inpatient Hospital Stay: Payer: Medicare Other

## 2019-03-06 ENCOUNTER — Inpatient Hospital Stay (HOSPITAL_BASED_OUTPATIENT_CLINIC_OR_DEPARTMENT_OTHER): Payer: Medicare Other | Admitting: Oncology

## 2019-03-06 DIAGNOSIS — R197 Diarrhea, unspecified: Secondary | ICD-10-CM

## 2019-03-06 DIAGNOSIS — D649 Anemia, unspecified: Secondary | ICD-10-CM

## 2019-03-06 DIAGNOSIS — E876 Hypokalemia: Secondary | ICD-10-CM

## 2019-03-06 DIAGNOSIS — C20 Malignant neoplasm of rectum: Secondary | ICD-10-CM

## 2019-03-06 DIAGNOSIS — I10 Essential (primary) hypertension: Secondary | ICD-10-CM

## 2019-03-06 DIAGNOSIS — Z5111 Encounter for antineoplastic chemotherapy: Secondary | ICD-10-CM

## 2019-03-06 DIAGNOSIS — Z510 Encounter for antineoplastic radiation therapy: Secondary | ICD-10-CM | POA: Diagnosis not present

## 2019-03-06 DIAGNOSIS — Z87891 Personal history of nicotine dependence: Secondary | ICD-10-CM

## 2019-03-06 MED ORDER — POTASSIUM CHLORIDE CRYS ER 20 MEQ PO TBCR: 20.0000 meq | EXTENDED_RELEASE_TABLET | Freq: Every day | ORAL | 0 refills | Status: DC

## 2019-03-06 NOTE — Progress Notes (Signed)
HEMATOLOGY-ONCOLOGY TeleHEALTH VISIT PROGRESS NOTE  I connected with Jacqueline Yoder on 03/06/19 at  9:30 AM EDT by video enabled telemedicine visit and verified that I am speaking with the correct person using two identifiers. I discussed the limitations, risks, security and privacy concerns of performing an evaluation and management service by telemedicine and the availability of in-person appointments. I also discussed with the patient that there may be a patient responsible charge related to this service. The patient expressed understanding and agreed to proceed.   Other persons participating in the visit and their role in the encounter:  Janeann Merl, RN, check in patient.   Patient's location: Home  Provider's location: Work Risk analyst Complaint: Follow-up for management concurrent chemotherapy with radiation for treatment of rectal cancer.   INTERVAL HISTORY Jacqueline Yoder is a 73 y.o. female who has above history reviewed by me today presents for follow up visit for management of concurrent chemotherapy with radiation for treatment of stage IIIc rectal cancer.  #Overall patient reports tolerating treatment so far.  She takesXeloda 825 mg/m twice daily on the days of radiation. Reports that she had 2 episode of loose stool yesterday.  She takes antidiarrhea.  Today so far she has not had any bowel movement. Denies nausea, vomiting.  Skin peeling, fever, chills. Chronic fatigue slightly worse.  Review of Systems  Constitutional: Positive for fatigue. Negative for appetite change, chills and fever.  HENT:   Negative for hearing loss and voice change.   Eyes: Negative for eye problems.  Respiratory: Negative for chest tightness and cough.   Cardiovascular: Negative for chest pain.  Gastrointestinal: Positive for diarrhea. Negative for abdominal distention, abdominal pain and blood in stool.  Endocrine: Negative for hot flashes.  Genitourinary: Negative for difficulty urinating and  frequency.   Musculoskeletal: Negative for arthralgias.  Skin: Negative for itching and rash.  Neurological: Negative for extremity weakness.  Hematological: Negative for adenopathy.  Psychiatric/Behavioral: Negative for confusion.     Past Medical History:  Diagnosis Date  . Hypercholesteremia   . Hypertension   . Hypertension   . Urinary incontinence    Past Surgical History:  Procedure Laterality Date  . CHOLECYSTECTOMY  1971  . COLONOSCOPY WITH PROPOFOL N/A 12/03/2018   Procedure: COLONOSCOPY WITH PROPOFOL;  Surgeon: Lucilla Lame, MD;  Location: [facility] ENDOSCOPY;  Service: Endoscopy;  Laterality: N/A;  . FLEXIBLE SIGMOIDOSCOPY N/A 12/06/2018   Procedure: FLEXIBLE SIGMOIDOSCOPY;  Surgeon: [name], MD;  Location: [facility] ENDOSCOPY;  Service: Endoscopy;  Laterality: N/A;    Family History  Problem Relation Age of Onset  . Colon cancer Brother   . Prostate cancer Brother   . Hypertension Mother   . Kidney failure Father   . Breast cancer Neg Hx   . Ovarian cancer Neg Hx     Social History   Socioeconomic History  . Marital status: Married    Spouse name: Not on file  . Number of children: Not on file  . Years of education: Not on file  . Highest education level: Not on file  Occupational History  . Not on file  Social Needs  . Financial resource strain: Not on file  . Food insecurity:    Worry: Not on file    Inability: Not on file  . Transportation needs:    Medical: Not on file    Non-medical: Not on file  Tobacco Use  . Smoking status: Former Smoker    Last attempt to quit: 12/02/1977    Years  since quitting: 41.2  . Smokeless tobacco: Never Used  Substance and Sexual Activity  . Alcohol use: Never    Frequency: Never  . Drug use: Never  . Sexual activity: Not Currently    Birth control/protection: None  Lifestyle  . Physical activity:    Days per week: Not on file    Minutes per session: Not on file  . Stress: Not on file  Relationships  . Social  connections:    Talks on phone: Not on file    Gets together: Not on file    Attends religious service: Not on file    Active member of club or organization: Not on file    Attends meetings of clubs or organizations: Not on file    Relationship status: Not on file  . Intimate partner violence:    Fear of current or ex partner: Not on file    Emotionally abused: Not on file    Physically abused: Not on file    Forced sexual activity: Not on file  Other Topics Concern  . Not on file  Social History Narrative  . Not on file    Current Outpatient Medications on File Prior to Visit  Medication Sig Dispense Refill  . capecitabine (XELODA) 150 MG tablet Take 1 tablet (150 mg total) by mouth 2 (two) times daily after a meal. Take with three 500mg  tablets. Take Mon-Fri, only on radiation days. 62 tablet 0  . capecitabine (XELODA) 500 MG tablet Take 3 tablets (1,500 mg total) by mouth 2 (two) times daily after a meal. Take with one 150mg  tablet. Take Mon-Fri, only on radiation days. 186 tablet 0  . Cholecalciferol (VITAMIN D3) 2000 units capsule Take 2,000 Units by mouth daily.    . diclofenac sodium (VOLTAREN) 1 % GEL Apply topically as needed.  11  . docusate sodium (COLACE) 100 MG capsule Take by mouth.    . fexofenadine (ALLEGRA) 180 MG tablet Take 180 mg by mouth daily.    . fluticasone (FLONASE) 50 MCG/ACT nasal spray 50 sprays daily.    Marland Kitchen ketorolac (ACULAR) 0.4 % SOLN Apply 4 drops to eye 2 (two) times daily.    . Lutein 40 MG CAPS Take by mouth.    . Multiple Vitamins-Minerals (ONE-A-DAY WOMENS 50 PLUS PO) Take by mouth.    . pantoprazole (PROTONIX) 40 MG tablet Take 40 mg by mouth as needed.    . prednisoLONE acetate (PRED FORTE) 1 % ophthalmic suspension 1 drop.  1  . simvastatin (ZOCOR) 40 MG tablet Take 40 mg by mouth daily.    Marland Kitchen triamterene-hydrochlorothiazide (DYAZIDE) 37.5-25 MG capsule Take by mouth daily.     No current facility-administered medications on file prior to  visit.     Allergies  Allergen Reactions  . Sulfamethoxazole-Trimethoprim Other (See Comments)    Also said she had chills and pressure in nose.       Observations/Objective: Today's Vitals   03/06/19 0846  PainSc: 0-No pain   There is no height or weight on file to calculate BMI.  Pain level 0 Physical Exam  Constitutional: She is oriented to person, place, and time. No distress.  HENT:  Head: Normocephalic and atraumatic.  Pulmonary/Chest: Effort normal.  Neurological: She is alert and oriented to person, place, and time.  Psychiatric: Affect normal.    CBC    Component Value Date/Time   WBC 3.9 (L) 03/05/2019 1013   RBC 3.63 (L) 03/05/2019 1013   HGB 11.4 (L) 03/05/2019 1013  HCT 34.5 (L) 03/05/2019 1013   PLT 142 (L) 03/05/2019 1013   MCV 95.0 03/05/2019 1013   MCH 31.4 03/05/2019 1013   MCHC 33.0 03/05/2019 1013   RDW 15.9 (H) 03/05/2019 1013   LYMPHSABS 0.3 (L) 03/05/2019 1013   MONOABS 0.4 03/05/2019 1013   EOSABS 0.3 03/05/2019 1013   BASOSABS 0.0 03/05/2019 1013    CMP     Component Value Date/Time   NA 139 03/05/2019 1013   K 3.0 (L) 03/05/2019 1013   CL 100 03/05/2019 1013   CO2 26 03/05/2019 1013   GLUCOSE 105 (H) 03/05/2019 1013   BUN 9 03/05/2019 1013   CREATININE 0.92 03/05/2019 1013   CALCIUM 9.1 03/05/2019 1013   PROT 6.6 03/05/2019 1013   ALBUMIN 3.9 03/05/2019 1013   AST 28 03/05/2019 1013   ALT 18 03/05/2019 1013   ALKPHOS 35 (L) 03/05/2019 1013   BILITOT 0.6 03/05/2019 1013   GFRNONAA >60 03/05/2019 1013   GFRAA >60 03/05/2019 1013     Assessment and Plan: 1. Hypokalemia   2. Encounter for antineoplastic chemotherapy   3. Rectal cancer ([place])   4. Diarrhea, unspecified type    Cancer Staging Rectal cancer [facility]) Staging form: Colon and Rectum, AJCC 8th Edition - Clinical stage from 01/23/2019: Stage IIIC (cT4b, cN1a, cM0) - Signed by Earlie Server, MD on 01/23/2019  #Labs reviewed and discussed with patient. Rectal cancer, on  concurrent chemotherapy Xeloda with radiation.  She tolerates well with mild difficulty see below. #Diarrhea likely secondary to chemotherapy.  Advised patient to use antidiarrhea medications.  If symptoms persist or worsen, she needs to update me.  #Hypokalemia, patient reports that she has been taking oral potassium supplementation potassium chloride 10 mEq daily. She has a prescription at home.  I recommend patient to increase supplements to potassium chloride 20 mEq daily.  A new prescription has been sent to her pharmacy.  Also encourage potassium rich food.  #Normocytic anemia, likely secondary to chemotherapy and radiation.  Continue monitor. #Thrombocytopenia, platelet counts 1 42,000, continue to monitor.  Family history of colon cancer, prostate cancer, personal history of colon cancer, recommend genetic testing.  Patient still considering.  Follow Up Instructions: 1 week with labs and MD assessment for chemotherapy.   I discussed the assessment and treatment plan with the patient. The patient was provided an opportunity to ask questions and all were answered. The patient agreed with the plan and demonstrated an understanding of the instructions.  The patient was advised to call back or seek an in-person evaluation if the symptoms worsen or if the condition fails to improve as anticipated.   Earlie Server, MD 03/06/2019 9:29 PM

## 2019-03-06 NOTE — Progress Notes (Signed)
DISCONTINUE ON PATHWAY REGIMEN - Colorectal     Administer Monday through Friday:     Capecitabine   **Always confirm dose/schedule in your pharmacy ordering system**  REASON: Other Reason PRIOR TREATMENT: [id number]: Capecitabine 825 mg/m2  PO BID (Monday - Friday) for Duration of Radiation Therapy TREATMENT RESPONSE: N/A - Adjuvant Therapy  START ON PATHWAY REGIMEN - Colorectal     A cycle is every 14 days:     Oxaliplatin      Leucovorin      5-Fluorouracil      5-Fluorouracil   **Always confirm dose/schedule in your pharmacy ordering system**  Patient Characteristics: Preoperative or Nonsurgical Candidate (Clinical Staging), Rectal, cT3 - cT4, cN0 or Any cT, cN+ Therapeutic Status: Preoperative or Nonsurgical Candidate (Clinical Staging) Tumor Location: Rectal AJCC T Category: cT4b AJCC N Category: cN1 AJCC M Category: cM0 AJCC 8 Stage Grouping: IIIC Intent of Therapy: Curative Intent, Discussed with Patient

## 2019-03-06 NOTE — Progress Notes (Signed)
Patient contacted for telehealth visit. Has loose stools on and off, takes antidiarrheal to help.

## 2019-03-06 NOTE — Progress Notes (Signed)
ON PATHWAY REGIMEN - Colorectal  No Change  Continue With Treatment as Ordered.     Administer Monday through Friday:     Capecitabine   **Always confirm dose/schedule in your pharmacy ordering system**  Patient Characteristics: Preoperative or Nonsurgical Candidate (Clinical Staging), Rectal, cT3 - cT4, cN0 or Any cT, cN+ Therapeutic Status: Preoperative or Nonsurgical Candidate (Clinical Staging) Tumor Location: Rectal AJCC T Category: cT4b AJCC N Category: cN1 AJCC M Category: cM0 AJCC 8 Stage Grouping: IIIC Intent of Therapy: Curative Intent, Discussed with Patient

## 2019-03-07 ENCOUNTER — Telehealth: Payer: Self-pay | Admitting: *Deleted

## 2019-03-07 ENCOUNTER — Other Ambulatory Visit: Payer: Self-pay

## 2019-03-07 ENCOUNTER — Ambulatory Visit
Admission: RE | Admit: 2019-03-07 | Discharge: 2019-03-07 | Disposition: A | Discharge status: Home / Self Care | Payer: Medicare Other | Source: Ambulatory Visit | Attending: Radiation Oncology | Admitting: Radiation Oncology

## 2019-03-07 DIAGNOSIS — Z510 Encounter for antineoplastic radiation therapy: Secondary | ICD-10-CM | POA: Diagnosis not present

## 2019-03-07 MED ORDER — LOPERAMIDE HCL 2 MG PO CAPS: 2.0000 mg | ORAL_CAPSULE | ORAL | 1 refills | Status: DC

## 2019-03-07 NOTE — Telephone Encounter (Signed)
Per MD request, I faxed a script for imodium to patient's [place] at 1252 pm.

## 2019-03-07 NOTE — Addendum Note (Signed)
Addended by: Earlie Server on: 03/07/2019 12:43 PM   Modules accepted: Orders

## 2019-03-07 NOTE — Telephone Encounter (Signed)
Patient called requesting that Dr Tasia Catchings call in the stronger medicine for diarrhea as discussed earlier this week

## 2019-03-11 ENCOUNTER — Other Ambulatory Visit: Payer: Self-pay

## 2019-03-11 ENCOUNTER — Ambulatory Visit
Admission: RE | Admit: 2019-03-11 | Discharge: 2019-03-11 | Disposition: A | Discharge status: Home / Self Care | Payer: Medicare Other | Source: Ambulatory Visit | Attending: Radiation Oncology | Admitting: Radiation Oncology

## 2019-03-11 DIAGNOSIS — Z510 Encounter for antineoplastic radiation therapy: Secondary | ICD-10-CM | POA: Diagnosis not present

## 2019-03-12 ENCOUNTER — Ambulatory Visit
Admission: RE | Admit: 2019-03-12 | Discharge: 2019-03-12 | Disposition: A | Discharge status: Home / Self Care | Payer: Medicare Other | Source: Ambulatory Visit | Attending: Radiation Oncology | Admitting: Radiation Oncology

## 2019-03-12 ENCOUNTER — Other Ambulatory Visit: Payer: Self-pay

## 2019-03-12 DIAGNOSIS — Z510 Encounter for antineoplastic radiation therapy: Secondary | ICD-10-CM | POA: Diagnosis not present

## 2019-03-13 ENCOUNTER — Ambulatory Visit
Admission: RE | Admit: 2019-03-13 | Discharge: 2019-03-13 | Disposition: A | Discharge status: Home / Self Care | Payer: Medicare Other | Source: Ambulatory Visit | Attending: Radiation Oncology | Admitting: Radiation Oncology

## 2019-03-13 ENCOUNTER — Inpatient Hospital Stay: Payer: Medicare Other

## 2019-03-13 ENCOUNTER — Encounter: Payer: Self-pay | Admitting: Oncology

## 2019-03-13 ENCOUNTER — Other Ambulatory Visit: Payer: Self-pay

## 2019-03-13 ENCOUNTER — Inpatient Hospital Stay (HOSPITAL_BASED_OUTPATIENT_CLINIC_OR_DEPARTMENT_OTHER): Payer: Medicare Other | Admitting: Oncology

## 2019-03-13 VITALS — BP 129/72 | HR 89 | Temp 97.6°F | Resp 18 | Wt 205.7 lb

## 2019-03-13 DIAGNOSIS — R634 Abnormal weight loss: Secondary | ICD-10-CM | POA: Diagnosis not present

## 2019-03-13 DIAGNOSIS — E876 Hypokalemia: Secondary | ICD-10-CM

## 2019-03-13 DIAGNOSIS — Z5111 Encounter for antineoplastic chemotherapy: Secondary | ICD-10-CM

## 2019-03-13 DIAGNOSIS — R110 Nausea: Secondary | ICD-10-CM

## 2019-03-13 DIAGNOSIS — E860 Dehydration: Secondary | ICD-10-CM

## 2019-03-13 DIAGNOSIS — C20 Malignant neoplasm of rectum: Secondary | ICD-10-CM

## 2019-03-13 DIAGNOSIS — Z87891 Personal history of nicotine dependence: Secondary | ICD-10-CM

## 2019-03-13 DIAGNOSIS — Z933 Colostomy status: Secondary | ICD-10-CM

## 2019-03-13 DIAGNOSIS — Z8042 Family history of malignant neoplasm of prostate: Secondary | ICD-10-CM

## 2019-03-13 DIAGNOSIS — R197 Diarrhea, unspecified: Secondary | ICD-10-CM

## 2019-03-13 DIAGNOSIS — Z79899 Other long term (current) drug therapy: Secondary | ICD-10-CM

## 2019-03-13 DIAGNOSIS — Z800 Family history of malignant neoplasm of digestive organs: Secondary | ICD-10-CM

## 2019-03-13 DIAGNOSIS — Z923 Personal history of irradiation: Secondary | ICD-10-CM

## 2019-03-13 DIAGNOSIS — R5383 Other fatigue: Secondary | ICD-10-CM

## 2019-03-13 DIAGNOSIS — E7800 Pure hypercholesterolemia, unspecified: Secondary | ICD-10-CM

## 2019-03-13 DIAGNOSIS — Z9221 Personal history of antineoplastic chemotherapy: Secondary | ICD-10-CM

## 2019-03-13 DIAGNOSIS — Z809 Family history of malignant neoplasm, unspecified: Secondary | ICD-10-CM

## 2019-03-13 DIAGNOSIS — Z510 Encounter for antineoplastic radiation therapy: Secondary | ICD-10-CM | POA: Diagnosis not present

## 2019-03-13 DIAGNOSIS — I10 Essential (primary) hypertension: Secondary | ICD-10-CM

## 2019-03-13 DIAGNOSIS — C778 Secondary and unspecified malignant neoplasm of lymph nodes of multiple regions: Secondary | ICD-10-CM

## 2019-03-13 DIAGNOSIS — K5900 Constipation, unspecified: Secondary | ICD-10-CM

## 2019-03-13 LAB — CBC
HCT: 36.3 % (ref 36.0–46.0)
Hemoglobin: 12 g/dL (ref 12.0–15.0)
MCH: 31.3 pg (ref 26.0–34.0)
MCHC: 33.1 g/dL (ref 30.0–36.0)
MCV: 94.8 fL (ref 80.0–100.0)
Platelets: 173 10*3/uL (ref 150–400)
RBC: 3.83 MIL/uL — ABNORMAL LOW (ref 3.87–5.11)
RDW: 16.8 % — ABNORMAL HIGH (ref 11.5–15.5)
WBC: 2.9 10*3/uL — ABNORMAL LOW (ref 4.0–10.5)
nRBC: 0 % (ref 0.0–0.2)

## 2019-03-13 LAB — BASIC METABOLIC PANEL
Anion gap: 11 (ref 5–15)
BUN: 7 mg/dL — ABNORMAL LOW (ref 8–23)
CO2: 28 mmol/L (ref 22–32)
Calcium: 9.1 mg/dL (ref 8.9–10.3)
Chloride: 96 mmol/L — ABNORMAL LOW (ref 98–111)
Creatinine, Ser: 0.93 mg/dL (ref 0.44–1.00)
GFR calc Af Amer: >60 mL/min (ref >60)
GFR calc non Af Amer: >60 mL/min (ref >60)
Glucose, Bld: 104 mg/dL — ABNORMAL HIGH (ref 70–99)
Potassium: 3 mmol/L — ABNORMAL LOW (ref 3.5–5.1)
Sodium: 135 mmol/L (ref 135–145)

## 2019-03-13 MED ORDER — NYSTATIN 100000 UNIT/GM EX POWD: Freq: Four times a day (QID) | CUTANEOUS | 0 refills | Status: DC

## 2019-03-13 MED ORDER — ONDANSETRON HCL 8 MG PO TABS: 8.0000 mg | ORAL_TABLET | Freq: Three times a day (TID) | ORAL | 1 refills | Status: DC | PRN

## 2019-03-13 MED ORDER — SODIUM CHLORIDE 0.9 % IV SOLN
20.0000 meq | Freq: Once | INTRAVENOUS | Status: AC
  Administered 2019-03-13: 20 meq via INTRAVENOUS
  Filled 2019-03-13: qty 10

## 2019-03-13 MED ORDER — ONDANSETRON HCL 4 MG/2ML IJ SOLN
8.0000 mg | Freq: Once | INTRAMUSCULAR | Status: AC
  Administered 2019-03-13: 8 mg via INTRAVENOUS
  Filled 2019-03-13: qty 4

## 2019-03-13 MED ORDER — PANTOPRAZOLE SODIUM 40 MG PO TBEC: 40.0000 mg | DELAYED_RELEASE_TABLET | ORAL | 0 refills | Status: DC | PRN

## 2019-03-13 MED ORDER — POTASSIUM CHLORIDE CRYS ER 20 MEQ PO TBCR: 20.0000 meq | EXTENDED_RELEASE_TABLET | Freq: Every day | ORAL | 0 refills | Status: DC

## 2019-03-13 MED ORDER — SODIUM CHLORIDE 0.9 % IV SOLN
INTRAVENOUS | Status: DC
  Administered 2019-03-13: 13:00:00 via INTRAVENOUS
  Filled 2019-03-13 (×2): qty 250

## 2019-03-13 NOTE — Patient Instructions (Signed)
Oxaliplatin Injection What is this medicine? OXALIPLATIN (ox AL i PLA tin) is a chemotherapy drug. It targets fast dividing cells, like cancer cells, and causes these cells to die. This medicine is used to treat cancers of the colon and rectum, and many other cancers. This medicine may be used for other purposes; ask your health care provider or pharmacist if you have questions. COMMON BRAND NAME(S): Eloxatin What should I tell my health care provider before I take this medicine? They need to know if you have any of these conditions: -kidney disease -an unusual or allergic reaction to oxaliplatin, other chemotherapy, other medicines, foods, dyes, or preservatives -pregnant or trying to get pregnant -breast-feeding How should I use this medicine? This drug is given as an infusion into a vein. It is administered in a hospital or clinic by a specially trained health care professional. Talk to your pediatrician regarding the use of this medicine in children. Special care may be needed. Overdosage: If you think you have taken too much of this medicine contact a poison control center or emergency room at once. NOTE: This medicine is only for you. Do not share this medicine with others. What if I miss a dose? It is important not to miss a dose. Call your doctor or health care professional if you are unable to keep an appointment. What may interact with this medicine? -medicines to increase blood counts like filgrastim, pegfilgrastim, sargramostim -probenecid -some antibiotics like amikacin, gentamicin, neomycin, polymyxin B, streptomycin, tobramycin -zalcitabine Talk to your doctor or health care professional before taking any of these medicines: -acetaminophen -aspirin -ibuprofen -ketoprofen -naproxen This list may not describe all possible interactions. Give your health care provider a list of all the medicines, herbs, non-prescription drugs, or dietary supplements you use. Also tell them if  you smoke, drink alcohol, or use illegal drugs. Some items may interact with your medicine. What should I watch for while using this medicine? Your condition will be monitored carefully while you are receiving this medicine. You will need important blood work done while you are taking this medicine. This medicine can make you more sensitive to cold. Do not drink cold drinks or use ice. Cover exposed skin before coming in contact with cold temperatures or cold objects. When out in cold weather wear warm clothing and cover your mouth and nose to warm the air that goes into your lungs. Tell your doctor if you get sensitive to the cold. This drug may make you feel generally unwell. This is not uncommon, as chemotherapy can affect healthy cells as well as cancer cells. Report any side effects. Continue your course of treatment even though you feel ill unless your doctor tells you to stop. In some cases, you may be given additional medicines to help with side effects. Follow all directions for their use. Call your doctor or health care professional for advice if you get a fever, chills or sore throat, or other symptoms of a cold or flu. Do not treat yourself. This drug decreases your body's ability to fight infections. Try to avoid being around people who are sick. This medicine may increase your risk to bruise or bleed. Call your doctor or health care professional if you notice any unusual bleeding. Be careful brushing and flossing your teeth or using a toothpick because you may get an infection or bleed more easily. If you have any dental work done, tell your dentist you are receiving this medicine. Avoid taking products that contain aspirin, acetaminophen, ibuprofen, naproxen,   or ketoprofen unless instructed by your doctor. These medicines may hide a fever. Do not become pregnant while taking this medicine. Women should inform their doctor if they wish to become pregnant or think they might be pregnant. There  is a potential for serious side effects to an unborn child. Talk to your health care professional or pharmacist for more information. Do not breast-feed an infant while taking this medicine. Call your doctor or health care professional if you get diarrhea. Do not treat yourself. What side effects may I notice from receiving this medicine? Side effects that you should report to your doctor or health care professional as soon as possible: -allergic reactions like skin rash, itching or hives, swelling of the face, lips, or tongue -low blood counts - This drug may decrease the number of white blood cells, red blood cells and platelets. You may be at increased risk for infections and bleeding. -signs of infection - fever or chills, cough, sore throat, pain or difficulty passing urine -signs of decreased platelets or bleeding - bruising, pinpoint red spots on the skin, black, tarry stools, nosebleeds -signs of decreased red blood cells - unusually weak or tired, fainting spells, lightheadedness -breathing problems -chest pain, pressure -cough -diarrhea -jaw tightness -mouth sores -nausea and vomiting -pain, swelling, redness or irritation at the injection site -pain, tingling, numbness in the hands or feet -problems with balance, talking, walking -redness, blistering, peeling or loosening of the skin, including inside the mouth -trouble passing urine or change in the amount of urine Side effects that usually do not require medical attention (report to your doctor or health care professional if they continue or are bothersome): -changes in vision -constipation -hair loss -loss of appetite -metallic taste in the mouth or changes in taste -stomach pain This list may not describe all possible side effects. Call your doctor for medical advice about side effects. You may report side effects to FDA at 1-800-FDA-1088. Where should I keep my medicine? This drug is given in a hospital or clinic and  will not be stored at home. NOTE: This sheet is a summary. It may not cover all possible information. If you have questions about this medicine, talk to your doctor, pharmacist, or health care provider.  2019 [employer] Standard (2008-04-28 17:22:47) Fluorouracil, 5-FU injection What is this medicine? FLUOROURACIL, 5-FU (flure oh YOOR a sil) is a chemotherapy drug. It slows the growth of cancer cells. This medicine is used to treat many types of cancer like breast cancer, colon or rectal cancer, pancreatic cancer, and stomach cancer. This medicine may be used for other purposes; ask your health care provider or pharmacist if you have questions. COMMON BRAND NAME(S): Adrucil What should I tell my health care provider before I take this medicine? They need to know if you have any of these conditions: -blood disorders -dihydropyrimidine dehydrogenase (DPD) deficiency -infection (especially a virus infection such as chickenpox, cold sores, or herpes) -kidney disease -liver disease -malnourished, poor nutrition -recent or ongoing radiation therapy -an unusual or allergic reaction to fluorouracil, other chemotherapy, other medicines, foods, dyes, or preservatives -pregnant or trying to get pregnant -breast-feeding How should I use this medicine? This drug is given as an infusion or injection into a vein. It is administered in a hospital or clinic by a specially trained health care professional. Talk to your pediatrician regarding the use of this medicine in children. Special care may be needed. Overdosage: If you think you have taken too much of this medicine contact a  poison control center or emergency room at once. NOTE: This medicine is only for you. Do not share this medicine with others. What if I miss a dose? It is important not to miss your dose. Call your doctor or health care professional if you are unable to keep an appointment. What may interact with this  medicine? -allopurinol -cimetidine -dapsone -digoxin -hydroxyurea -leucovorin -levamisole -medicines for seizures like ethotoin, fosphenytoin, phenytoin -medicines to increase blood counts like filgrastim, pegfilgrastim, sargramostim -medicines that treat or prevent blood clots like warfarin, enoxaparin, and dalteparin -methotrexate -metronidazole -pyrimethamine -some other chemotherapy drugs like busulfan, cisplatin, estramustine, vinblastine -trimethoprim -trimetrexate -vaccines Talk to your doctor or health care professional before taking any of these medicines: -acetaminophen -aspirin -ibuprofen -ketoprofen -naproxen This list may not describe all possible interactions. Give your health care provider a list of all the medicines, herbs, non-prescription drugs, or dietary supplements you use. Also tell them if you smoke, drink alcohol, or use illegal drugs. Some items may interact with your medicine. What should I watch for while using this medicine? Visit your doctor for checks on your progress. This drug may make you feel generally unwell. This is not uncommon, as chemotherapy can affect healthy cells as well as cancer cells. Report any side effects. Continue your course of treatment even though you feel ill unless your doctor tells you to stop. In some cases, you may be given additional medicines to help with side effects. Follow all directions for their use. Call your doctor or health care professional for advice if you get a fever, chills or sore throat, or other symptoms of a cold or flu. Do not treat yourself. This drug decreases your body's ability to fight infections. Try to avoid being around people who are sick. This medicine may increase your risk to bruise or bleed. Call your doctor or health care professional if you notice any unusual bleeding. Be careful brushing and flossing your teeth or using a toothpick because you may get an infection or bleed more easily. If you  have any dental work done, tell your dentist you are receiving this medicine. Avoid taking products that contain aspirin, acetaminophen, ibuprofen, naproxen, or ketoprofen unless instructed by your doctor. These medicines may hide a fever. Do not become pregnant while taking this medicine. Women should inform their doctor if they wish to become pregnant or think they might be pregnant. There is a potential for serious side effects to an unborn child. Talk to your health care professional or pharmacist for more information. Do not breast-feed an infant while taking this medicine. Men should inform their doctor if they wish to father a child. This medicine may lower sperm counts. Do not treat diarrhea with over the counter products. Contact your doctor if you have diarrhea that lasts more than 2 days or if it is severe and watery. This medicine can make you more sensitive to the sun. Keep out of the sun. If you cannot avoid being in the sun, wear protective clothing and use sunscreen. Do not use sun lamps or tanning beds/booths. What side effects may I notice from receiving this medicine? Side effects that you should report to your doctor or health care professional as soon as possible: -allergic reactions like skin rash, itching or hives, swelling of the face, lips, or tongue -low blood counts - this medicine may decrease the number of white blood cells, red blood cells and platelets. You may be at increased risk for infections and bleeding. -signs of infection -  fever or chills, cough, sore throat, pain or difficulty passing urine -signs of decreased platelets or bleeding - bruising, pinpoint red spots on the skin, black, tarry stools, blood in the urine -signs of decreased red blood cells - unusually weak or tired, fainting spells, lightheadedness -breathing problems -changes in vision -chest pain -mouth sores -nausea and vomiting -pain, swelling, redness at site where injected -pain, tingling,  numbness in the hands or feet -redness, swelling, or sores on hands or feet -stomach pain -unusual bleeding Side effects that usually do not require medical attention (report to your doctor or health care professional if they continue or are bothersome): -changes in finger or toe nails -diarrhea -dry or itchy skin -hair loss -headache -loss of appetite -sensitivity of eyes to the light -stomach upset -unusually teary eyes This list may not describe all possible side effects. Call your doctor for medical advice about side effects. You may report side effects to FDA at 1-800-FDA-1088. Where should I keep my medicine? This drug is given in a hospital or clinic and will not be stored at home. NOTE: This sheet is a summary. It may not cover all possible information. If you have questions about this medicine, talk to your doctor, pharmacist, or health care provider.  2019 [employer] Standard (2008-02-05 13:53:16) Leucovorin injection What is this medicine? LEUCOVORIN (loo koe VOR in) is used to prevent or treat the harmful effects of some medicines. This medicine is used to treat anemia caused by a low amount of folic acid in the body. It is also used with 5-fluorouracil (5-FU) to treat colon cancer. This medicine may be used for other purposes; ask your health care provider or pharmacist if you have questions. What should I tell my health care provider before I take this medicine? They need to know if you have any of these conditions: -anemia from low levels of vitamin B-12 in the blood -an unusual or allergic reaction to leucovorin, folic acid, other medicines, foods, dyes, or preservatives -pregnant or trying to get pregnant -breast-feeding How should I use this medicine? This medicine is for injection into a muscle or into a vein. It is given by a health care professional in a hospital or clinic setting. Talk to your pediatrician regarding the use of this medicine in children.  Special care may be needed. Overdosage: If you think you have taken too much of this medicine contact a poison control center or emergency room at once. NOTE: This medicine is only for you. Do not share this medicine with others. What if I miss a dose? This does not apply. What may interact with this medicine? -capecitabine -fluorouracil -phenobarbital -phenytoin -primidone -trimethoprim-sulfamethoxazole This list may not describe all possible interactions. Give your health care provider a list of all the medicines, herbs, non-prescription drugs, or dietary supplements you use. Also tell them if you smoke, drink alcohol, or use illegal drugs. Some items may interact with your medicine. What should I watch for while using this medicine? Your condition will be monitored carefully while you are receiving this medicine. This medicine may increase the side effects of 5-fluorouracil, 5-FU. Tell your doctor or health care professional if you have diarrhea or mouth sores that do not get better or that get worse. What side effects may I notice from receiving this medicine? Side effects that you should report to your doctor or health care professional as soon as possible: -allergic reactions like skin rash, itching or hives, swelling of the face, lips, or tongue -breathing problems -  fever, infection -mouth sores -unusual bleeding or bruising -unusually weak or tired Side effects that usually do not require medical attention (report to your doctor or health care professional if they continue or are bothersome): -constipation or diarrhea -loss of appetite -nausea, vomiting This list may not describe all possible side effects. Call your doctor for medical advice about side effects. You may report side effects to FDA at 1-800-FDA-1088. Where should I keep my medicine? This drug is given in a hospital or clinic and will not be stored at home. NOTE: This sheet is a summary. It may not cover all  possible information. If you have questions about this medicine, talk to your doctor, pharmacist, or health care provider.  2019 [employer] Standard (2008-04-07 16:50:29)

## 2019-03-13 NOTE — Progress Notes (Signed)
Patient here for follow up. States that she was nauseated overnight. Requesting antiemetic. Pt having loose stools and took anti-diarrheal.

## 2019-03-14 ENCOUNTER — Other Ambulatory Visit: Payer: Self-pay

## 2019-03-14 ENCOUNTER — Ambulatory Visit: Payer: Medicare Other

## 2019-03-14 ENCOUNTER — Ambulatory Visit
Admission: RE | Admit: 2019-03-14 | Discharge: 2019-03-14 | Disposition: A | Discharge status: Home / Self Care | Payer: Medicare Other | Source: Ambulatory Visit | Attending: Radiation Oncology | Admitting: Radiation Oncology

## 2019-03-14 DIAGNOSIS — Z510 Encounter for antineoplastic radiation therapy: Secondary | ICD-10-CM | POA: Diagnosis not present

## 2019-03-16 NOTE — Progress Notes (Signed)
Hematology/Oncology progress note [facility] Telephone:([phone] [phone]   Patient Care Team: Kirk Ruths, MD as PCP - General (Internal Medicine) Clent Jacks, RN as Registered Nurse  REFERRING PROVIDER: Dr.Secord CHIEF COMPLAINTS/REASON FOR VISIT:  Evaluation of endometrial cancer  HISTORY OF PRESENTING ILLNESS:  Jacqueline Yoder is a  73 y.o.  female with PMH listed below who was referred to me for evaluation of endometrial cancer.   Patient initially presented with complaints of postmenopausal bleeding on 08/16/2018.  History of was menopausal vaginal bleeding in 2016 which resulted in cervical polypectomy.  Pathology 02/04/2015 showed cervical polyp, consistent with benign endometrial polyp.  [name] after polypectomy due to anxiety associated with pelvic exams.  pelvic exam on 08/16/2018 reviewed cervical abnormality and from enlarged uterus. Seen by Dr. Marcelline Mates on 10/29/2018.  Endometrial biopsy and a Pap smear was performed. 10/29/2018 Pap smear showed adenocarcinoma, favor endometrial origin. 10/29/2018 endometrial biopsy showed endometrioid carcinoma, FIGO grade 1.  10/29/2018- TA & TV Ultrasound revealed: Anteverted uterus measuring 8.7 x 5.6 x 6.4 cm without evidence of focal masses.  The endometrium measuring 24.1 mm (thickened) and heterogeneous.  Right and left ovaries not visualized.  No adnexal masses identified.  No free fluid in cul-de-sac.  Patient was seen by Dr. Theora Gianotti in clinic on 11/13/2018.  Cervical exam reveals 2 cm exophytic irregular mass consistent with malignancy.   11/19/2018 CT chest abdomen pelvis with contrast showed thickened endometrium with some irregularity compatible with the provided diagnosis of endometrial malignancy.  There is a mildly prominent left inguinal node 1.4 cm.  Patient was seen by Dr. Fransisca Connors on 11/20/2018 and left groin lymph node biopsy was recommended.  11/26/2018 patient  underwent left inguinal lymph node biopsy. Pathology showed metastatic adenocarcinoma consistent with colorectal origin.  CDX 2+.  Case was discussed on tumor board.  Recommend colonoscopy for further evaluation.  Patient reports significant weight loss 30 pounds over the last year.  Chronic vaginal spotting. Change of bowel habits the past few months.  More constipated.  Family history positive for brother who has colon cancer prostate cancer.  patient has underwent colonoscopy on 12/03/2018 which reviewed a nonobstructing large mass in the rectum.  Also chronic fistula.  Mass was not circumferential.  This was biopsied with a cold forceps for histology.  Pathology came back hyperplastic polyp negative for dysplasia and malignancy. [name], patient underwent flex sigmoidoscopy on 12/06/2018 with rebiopsy of the rectal mass. This time biopsy results came back positive for invasive colorectal adenocarcinoma, moderately differentiated. Immunotherapy for nearly mismatch repair protein (MMR ) was performed.  There is no loss of MMR expression.  low probability of MSI high.   # Seen by [name] for evaluation of resectability for rectal cancer. In addition, she also had a second opinion with [facility] pathology where her endometrial biopsy pathology was changed to  adenocarcinoma, consistent with colorectal primary.   [name]. She has home health that has been assisting with ostomy care  Patient was also evaluated by [facility] oncology.  Recommendation is to proceed with TNT with concurrent chemoradiation followed by neoadjuvant chemotherapy followed by surgical resection. Patient prefers to have treatment done locally with [facility].   # Oncology Treatment:  02/03/2019- 03/19/2019  concurrent Xeloda and radiation.  Xeloda dose 877m /m2 BID - rounded to 16528mBID- on days of radiation.  INTERVAL HISTORY Jacqueline OQUENDOs a [age].o. female who has  above history reviewed by me today presents for follow up visit for evaluation of concurrent radiation with chemotherapy Xeloda. She reports that she has been feeling well until last evening when she felt nauseated.  She is not sure if it is related to any food.  Continues to have loose stools in her colostomy bag.  Takes antidiarrhea medication as instructed. Appetite is fair.  She has lost 7 pounds since the start of radiation and chemotherapy.   Review of Systems  Constitutional: Positive for fatigue. Negative for appetite change, chills, fever and unexpected weight change.  HENT:   Negative for hearing loss and voice change.   Eyes: Negative for eye problems.  Respiratory: Negative for chest tightness and cough.   Cardiovascular: Negative for chest pain.  Gastrointestinal: Positive for diarrhea and nausea. Negative for abdominal distention, abdominal pain, blood in stool and constipation.  Endocrine: Negative for hot flashes.  Genitourinary: Negative for difficulty urinating and frequency.   Musculoskeletal: Negative for arthralgias.  Skin: Negative for itching and rash.  Neurological: Negative for extremity weakness.  Hematological: Negative for adenopathy.  Psychiatric/Behavioral: Negative for confusion.    MEDICAL HISTORY:  Past Medical History:  Diagnosis Date  . Hypercholesteremia   . Hypertension   . Hypertension   . Urinary incontinence     SURGICAL HISTORY: Past Surgical History:  Procedure Laterality Date  . CHOLECYSTECTOMY  1971  . COLONOSCOPY WITH PROPOFOL N/A 12/03/2018   Procedure: COLONOSCOPY WITH PROPOFOL;  Surgeon: Lucilla Lame, MD;  Location: [facility] ENDOSCOPY;  Service: Endoscopy;  Laterality: N/A;  . FLEXIBLE SIGMOIDOSCOPY N/A 12/06/2018   Procedure: FLEXIBLE SIGMOIDOSCOPY;  Surgeon: [name], MD;  Location: [facility] ENDOSCOPY;  Service: Endoscopy;  Laterality: N/A;    SOCIAL HISTORY: Social History   Socioeconomic History  . Marital status: Married     Spouse name: Not on file  . Number of children: Not on file  . Years of education: Not on file  . Highest education level: Not on file  Occupational History  . Not on file  Social Needs  . Financial resource strain: Not on file  . Food insecurity:    Worry: Not on file    Inability: Not on file  . Transportation needs:    Medical: Not on file    Non-medical: Not on file  Tobacco Use  . Smoking status: Former Smoker    Last attempt to quit: 12/02/1977    Years since quitting: 41.3  . Smokeless tobacco: Never Used  Substance and Sexual Activity  . Alcohol use: Never    Frequency: Never  . Drug use: Never  . Sexual activity: Not Currently    Birth control/protection: None  Lifestyle  . Physical activity:    Days per week: Not on file    Minutes per session: Not on file  . Stress: Not on file  Relationships  . Social connections:    Talks on phone: Not on file    Gets together: Not on file    Attends religious service: Not on file    Active member of club or organization: Not on file    Attends meetings of clubs or organizations: Not on file    Relationship status: Not on file  . Intimate partner violence:    Fear of current or ex partner: Not on file    Emotionally abused: Not on file    Physically abused: Not on file    Forced sexual activity: Not on file  Other Topics Concern  .  Not on file  Social History Narrative  . Not on file    FAMILY HISTORY: Family History  Problem Relation Age of Onset  . Colon cancer Brother   . Prostate cancer Brother   . Hypertension Mother   . Kidney failure Father   . Breast cancer Neg Hx   . Ovarian cancer Neg Hx     ALLERGIES:  is allergic to sulfamethoxazole-trimethoprim.  MEDICATIONS:  Current Outpatient Medications  Medication Sig Dispense Refill  . capecitabine (XELODA) 150 MG tablet Take 1 tablet (150 mg total) by mouth 2 (two) times daily after a meal. Take with three 551m tablets. Take Mon-Fri, only on radiation  days. 62 tablet 0  . capecitabine (XELODA) 500 MG tablet Take 3 tablets (1,500 mg total) by mouth 2 (two) times daily after a meal. Take with one 1533mtablet. Take Mon-Fri, only on radiation days. 186 tablet 0  . Cholecalciferol (VITAMIN D3) 2000 units capsule Take 2,000 Units by mouth daily.    . diclofenac sodium (VOLTAREN) 1 % GEL Apply topically as needed.  11  . docusate sodium (COLACE) 100 MG capsule Take by mouth.    . fexofenadine (ALLEGRA) 180 MG tablet Take 180 mg by mouth daily.    . fluticasone (FLONASE) 50 MCG/ACT nasal spray 50 sprays daily.    . Marland Kitchenetorolac (ACULAR) 0.4 % SOLN Apply 4 drops to eye 2 (two) times daily.    . Marland Kitchenoperamide (IMODIUM) 2 MG capsule Take 1 capsule (2 mg total) by mouth See admin instructions. With onset of loose stool, take 31m86mollowed by 2mg69mery 2 hours,  Maximum: 16 mg/day 120 capsule 1  . Lutein 40 MG CAPS Take by mouth.    . Multiple Vitamins-Minerals (ONE-A-DAY WOMENS 50 PLUS PO) Take by mouth.    . pantoprazole (PROTONIX) 40 MG tablet Take 1 tablet (40 mg total) by mouth as needed. 30 tablet 0  . potassium chloride SA (K-DUR) 20 MEQ tablet Take 1 tablet (20 mEq total) by mouth daily. 14 tablet 0  . prednisoLONE acetate (PRED FORTE) 1 % ophthalmic suspension 1 drop.  1  . simvastatin (ZOCOR) 40 MG tablet Take 40 mg by mouth daily.    . trMarland Kitchenamterene-hydrochlorothiazide (DYAZIDE) 37.5-25 MG capsule Take by mouth daily.    . nyMarland Kitchentatin (MYCOSTATIN/NYSTOP) powder Apply topically 4 (four) times daily. 30 g 0  . ondansetron (ZOFRAN) 8 MG tablet Take 1 tablet (8 mg total) by mouth every 8 (eight) hours as needed for nausea, vomiting or refractory nausea / vomiting. 90 tablet 1   No current facility-administered medications for this visit.      PHYSICAL EXAMINATION: ECOG PERFORMANCE STATUS: 2 - Symptomatic, <50% confined to bed Vitals:   03/13/19 1108  BP: 129/72  Pulse: 89  Resp: 18  Temp: 97.6 F (36.4 C)   Filed Weights   03/13/19 1108  Weight:  205 lb 11.2 oz (93.3 kg)    Physical Exam Constitutional:      General: She is not in acute distress. HENT:     Head: Normocephalic and atraumatic.  Eyes:     General: No scleral icterus.    Pupils: Pupils are equal, round, and reactive to light.  Neck:     Musculoskeletal: Normal range of motion and neck supple.  Cardiovascular:     Rate and Rhythm: Normal rate and regular rhythm.     Heart sounds: Normal heart sounds.  Pulmonary:     Effort: Pulmonary effort is normal. No respiratory distress.  Breath sounds: No wheezing.  Abdominal:     General: Bowel sounds are normal. There is no distension.     Palpations: Abdomen is soft. There is no mass.     Tenderness: There is no abdominal tenderness.     Comments: + colostomy bag with small amount of yellow color loose stool.   Musculoskeletal: Normal range of motion.        General: No deformity.  Skin:    General: Skin is warm and dry.     Findings: No erythema or rash.  Neurological:     Mental Status: She is alert and oriented to person, place, and time.     Cranial Nerves: No cranial nerve deficit.     Coordination: Coordination normal.  Psychiatric:        Behavior: Behavior normal.        Thought Content: Thought content normal.     RADIOGRAPHIC STUDIES: I have personally reviewed the radiological images as listed and agreed with the findings in the report.  CMP Latest Ref Rng & Units 03/13/2019  Glucose 70 - 99 mg/dL 104(H)  BUN 8 - 23 mg/dL 7(L)  Creatinine 0.44 - 1.00 mg/dL 0.93  Sodium 135 - 145 mmol/L 135  Potassium 3.5 - 5.1 mmol/L 3.0(L)  Chloride 98 - 111 mmol/L 96(L)  CO2 22 - 32 mmol/L 28  Calcium 8.9 - 10.3 mg/dL 9.1  Total Protein 6.5 - 8.1 g/dL -  Total Bilirubin 0.3 - 1.2 mg/dL -  Alkaline Phos 38 - 126 U/L -  AST 15 - 41 U/L -  ALT 0 - 44 U/L -   CBC Latest Ref Rng & Units 03/13/2019  WBC 4.0 - 10.5 K/uL 2.9(L)  Hemoglobin 12.0 - 15.0 g/dL 12.0  Hematocrit 36.0 - 46.0 % 36.3  Platelets  150 - 400 K/uL 173    LABORATORY DATA:  I have reviewed the data as listed Lab Results  Component Value Date   WBC 2.9 (L) 03/13/2019   HGB 12.0 03/13/2019   HCT 36.3 03/13/2019   MCV 94.8 03/13/2019   PLT 173 03/13/2019   Recent Labs    01/22/19 1131 02/07/19 1153 03/05/19 1013 03/13/19 1052  NA 139 139 139 135  K 3.3* 3.7 3.0* 3.0*  CL 102 104 100 96*  CO2 '27 26 26 28  ' GLUCOSE 104* 97 105* 104*  BUN 7* 11 9 7*  CREATININE 0.98 0.88 0.92 0.93  CALCIUM 9.5 9.3 9.1 9.1  GFRNONAA 58* >60 >60 >60  GFRAA >60 >60 >60 >60  PROT 7.2 6.8 6.6  --   ALBUMIN 4.2 3.9 3.9  --   AST '31 25 28  ' --   ALT '23 17 18  ' --   ALKPHOS 34* 34* 35*  --   BILITOT 0.5 0.6 0.6  --    Iron/TIBC/Ferritin/ %Sat No results found for: IRON, TIBC, FERRITIN, IRONPCTSAT   RADIOGRAPHIC STUDIES: I have personally reviewed the radiological images as listed and agreed with the findings in the report. #11/19/2018 CT chest abdomen pelvis chest 1. Thickened endometrium with some irregularity compatible with the provided diagnosis of endometrial malignancy. There is a mildly prominent left inguinal lymph node at 1.4 cm in short axis which may reflect nodal spread. 2. Bilateral cystic renal lesions. We do not have precontrast images in order to be able to assigned Bosniak classifications. No significant degree of de-enhancement to favor solid mass. 3. Aortic Atherosclerosis (ICD10-I70.0). Coronary atherosclerosis. Sigmoid colon diverticulosis, with several areas of mild  colon wall thickening probably from contraction. Correlation with the patient's colon cancer screening history is recommended. If screening is not up-to-date, appropriate screening should be considered. 4. Lumbar impingement at L3-4 and L4-5.5. Esophageal reflux versus dysmotility.  ASSESSMENT & PLAN:  1. Rectal cancer ([place])   2. Diarrhea, unspecified type   3. Encounter for antineoplastic chemotherapy   4. Hypokalemia   5. Family history of  cancer   6. Family history of prostate cancer   7. Family history of colon cancer   8. Weight loss   Cancer Staging Rectal cancer [facility]) Staging form: Colon and Rectum, AJCC 8th Edition - Clinical stage from 01/23/2019: Stage IIIC (cT4b, cN1a, cM0) - Signed by Earlie Server, MD on 01/23/2019 Labs are reviewed and discussed with patient. She is finishing radiation next week. Continue concurrent Xeloda.  #Nausea, antiemetics Zofran prescription sent to pharmacy. She will get symptom management with IV fluid for hydration and a dose of Zofran IV in the clinic today.  #Hypokalemia, likely secondary to nausea poor p.o. intake. Replete with IV potassium today.  Recommend patient to take potassium chloride 64mq daily. Rx sent to phamacy.   #We discussed about the plan for next phase of treatment. After she finishes concurrent chemoradiation, she will have approximately 2 to 3 weeks of break and recommend patient to proceed with systemic chemotherapy with FOLFOX.  I explained to the patient the risks and benefits of chemotherapy including all but not limited to infusion reaction, hair loss, hearing loss, mouth sore, nausea, vomiting,neuropathy,  low blood counts, bleeding, heart failure, kidney failure and risk of life threatening infection and even death, secondary malignancy etc.   Patient voices understanding and willing to proceed chemotherapy.   # Chemotherapy education; port placement refer to Dr.Pabon, Antiemetics-Zofran and Compazine; EMLA cream sent to pharmacy  # weight loss, refer to Dietitian.  #Noticed family history of colon cancer, prostate cancer in brother, she has personal history of endometrial cancer and pending further work-up for possible colorectal malignancy.  Discussed about genetic testing.   Family is still thinking about genetic testing.    I called and updated Daughter LKristeen Missabove plan.   We spent sufficient time to discuss many aspect of care, questions were answered to  patient's satisfaction. Total face to face encounter time for this patient visit was 40 min. >50% of the time was  spent in counseling and coordination of care.   Follow up on 04/07/2019 for lab md assessment prior chemo  ZEarlie Server MD, PhD Hematology Oncology [facility]Advanced [facility] Surgical [facility] LPat ACurahealth New OrleansPager- [phone]/05/2019

## 2019-03-17 ENCOUNTER — Other Ambulatory Visit: Payer: Medicare Other

## 2019-03-17 ENCOUNTER — Inpatient Hospital Stay: Payer: Medicare Other

## 2019-03-17 ENCOUNTER — Ambulatory Visit: Admission: RE | Admit: 2019-03-17 | Payer: Medicare Other | Source: Ambulatory Visit

## 2019-03-17 ENCOUNTER — Other Ambulatory Visit: Payer: Self-pay

## 2019-03-17 DIAGNOSIS — R634 Abnormal weight loss: Secondary | ICD-10-CM

## 2019-03-17 DIAGNOSIS — C20 Malignant neoplasm of rectum: Secondary | ICD-10-CM

## 2019-03-18 ENCOUNTER — Other Ambulatory Visit: Payer: Medicare Other

## 2019-03-18 ENCOUNTER — Ambulatory Visit: Payer: Medicare Other

## 2019-03-18 NOTE — Patient Instructions (Signed)
Oxaliplatin Injection What is this medicine? OXALIPLATIN (ox AL i PLA tin) is a chemotherapy drug. It targets fast dividing cells, like cancer cells, and causes these cells to die. This medicine is used to treat cancers of the colon and rectum, and many other cancers. This medicine may be used for other purposes; ask your health care provider or pharmacist if you have questions. COMMON BRAND NAME(S): Eloxatin What should I tell my health care provider before I take this medicine? They need to know if you have any of these conditions: -kidney disease -an unusual or allergic reaction to oxaliplatin, other chemotherapy, other medicines, foods, dyes, or preservatives -pregnant or trying to get pregnant -breast-feeding How should I use this medicine? This drug is given as an infusion into a vein. It is administered in a hospital or clinic by a specially trained health care professional. Talk to your pediatrician regarding the use of this medicine in children. Special care may be needed. Overdosage: If you think you have taken too much of this medicine contact a poison control center or emergency room at once. NOTE: This medicine is only for you. Do not share this medicine with others. What if I miss a dose? It is important not to miss a dose. Call your doctor or health care professional if you are unable to keep an appointment. What may interact with this medicine? -medicines to increase blood counts like filgrastim, pegfilgrastim, sargramostim -probenecid -some antibiotics like amikacin, gentamicin, neomycin, polymyxin B, streptomycin, tobramycin -zalcitabine Talk to your doctor or health care professional before taking any of these medicines: -acetaminophen -aspirin -ibuprofen -ketoprofen -naproxen This list may not describe all possible interactions. Give your health care provider a list of all the medicines, herbs, non-prescription drugs, or dietary supplements you use. Also tell them if  you smoke, drink alcohol, or use illegal drugs. Some items may interact with your medicine. What should I watch for while using this medicine? Your condition will be monitored carefully while you are receiving this medicine. You will need important blood work done while you are taking this medicine. This medicine can make you more sensitive to cold. Do not drink cold drinks or use ice. Cover exposed skin before coming in contact with cold temperatures or cold objects. When out in cold weather wear warm clothing and cover your mouth and nose to warm the air that goes into your lungs. Tell your doctor if you get sensitive to the cold. This drug may make you feel generally unwell. This is not uncommon, as chemotherapy can affect healthy cells as well as cancer cells. Report any side effects. Continue your course of treatment even though you feel ill unless your doctor tells you to stop. In some cases, you may be given additional medicines to help with side effects. Follow all directions for their use. Call your doctor or health care professional for advice if you get a fever, chills or sore throat, or other symptoms of a cold or flu. Do not treat yourself. This drug decreases your body's ability to fight infections. Try to avoid being around people who are sick. This medicine may increase your risk to bruise or bleed. Call your doctor or health care professional if you notice any unusual bleeding. Be careful brushing and flossing your teeth or using a toothpick because you may get an infection or bleed more easily. If you have any dental work done, tell your dentist you are receiving this medicine. Avoid taking products that contain aspirin, acetaminophen, ibuprofen, naproxen,   or ketoprofen unless instructed by your doctor. These medicines may hide a fever. Do not become pregnant while taking this medicine. Women should inform their doctor if they wish to become pregnant or think they might be pregnant. There  is a potential for serious side effects to an unborn child. Talk to your health care professional or pharmacist for more information. Do not breast-feed an infant while taking this medicine. Call your doctor or health care professional if you get diarrhea. Do not treat yourself. What side effects may I notice from receiving this medicine? Side effects that you should report to your doctor or health care professional as soon as possible: -allergic reactions like skin rash, itching or hives, swelling of the face, lips, or tongue -low blood counts - This drug may decrease the number of white blood cells, red blood cells and platelets. You may be at increased risk for infections and bleeding. -signs of infection - fever or chills, cough, sore throat, pain or difficulty passing urine -signs of decreased platelets or bleeding - bruising, pinpoint red spots on the skin, black, tarry stools, nosebleeds -signs of decreased red blood cells - unusually weak or tired, fainting spells, lightheadedness -breathing problems -chest pain, pressure -cough -diarrhea -jaw tightness -mouth sores -nausea and vomiting -pain, swelling, redness or irritation at the injection site -pain, tingling, numbness in the hands or feet -problems with balance, talking, walking -redness, blistering, peeling or loosening of the skin, including inside the mouth -trouble passing urine or change in the amount of urine Side effects that usually do not require medical attention (report to your doctor or health care professional if they continue or are bothersome): -changes in vision -constipation -hair loss -loss of appetite -metallic taste in the mouth or changes in taste -stomach pain This list may not describe all possible side effects. Call your doctor for medical advice about side effects. You may report side effects to FDA at 1-800-FDA-1088. Where should I keep my medicine? This drug is given in a hospital or clinic and  will not be stored at home. NOTE: This sheet is a summary. It may not cover all possible information. If you have questions about this medicine, talk to your doctor, pharmacist, or health care provider.  2019 [employer] Standard (2008-04-28 17:22:47) Fluorouracil, 5-FU injection What is this medicine? FLUOROURACIL, 5-FU (flure oh YOOR a sil) is a chemotherapy drug. It slows the growth of cancer cells. This medicine is used to treat many types of cancer like breast cancer, colon or rectal cancer, pancreatic cancer, and stomach cancer. This medicine may be used for other purposes; ask your health care provider or pharmacist if you have questions. COMMON BRAND NAME(S): Adrucil What should I tell my health care provider before I take this medicine? They need to know if you have any of these conditions: -blood disorders -dihydropyrimidine dehydrogenase (DPD) deficiency -infection (especially a virus infection such as chickenpox, cold sores, or herpes) -kidney disease -liver disease -malnourished, poor nutrition -recent or ongoing radiation therapy -an unusual or allergic reaction to fluorouracil, other chemotherapy, other medicines, foods, dyes, or preservatives -pregnant or trying to get pregnant -breast-feeding How should I use this medicine? This drug is given as an infusion or injection into a vein. It is administered in a hospital or clinic by a specially trained health care professional. Talk to your pediatrician regarding the use of this medicine in children. Special care may be needed. Overdosage: If you think you have taken too much of this medicine contact a  poison control center or emergency room at once. NOTE: This medicine is only for you. Do not share this medicine with others. What if I miss a dose? It is important not to miss your dose. Call your doctor or health care professional if you are unable to keep an appointment. What may interact with this  medicine? -allopurinol -cimetidine -dapsone -digoxin -hydroxyurea -leucovorin -levamisole -medicines for seizures like ethotoin, fosphenytoin, phenytoin -medicines to increase blood counts like filgrastim, pegfilgrastim, sargramostim -medicines that treat or prevent blood clots like warfarin, enoxaparin, and dalteparin -methotrexate -metronidazole -pyrimethamine -some other chemotherapy drugs like busulfan, cisplatin, estramustine, vinblastine -trimethoprim -trimetrexate -vaccines Talk to your doctor or health care professional before taking any of these medicines: -acetaminophen -aspirin -ibuprofen -ketoprofen -naproxen This list may not describe all possible interactions. Give your health care provider a list of all the medicines, herbs, non-prescription drugs, or dietary supplements you use. Also tell them if you smoke, drink alcohol, or use illegal drugs. Some items may interact with your medicine. What should I watch for while using this medicine? Visit your doctor for checks on your progress. This drug may make you feel generally unwell. This is not uncommon, as chemotherapy can affect healthy cells as well as cancer cells. Report any side effects. Continue your course of treatment even though you feel ill unless your doctor tells you to stop. In some cases, you may be given additional medicines to help with side effects. Follow all directions for their use. Call your doctor or health care professional for advice if you get a fever, chills or sore throat, or other symptoms of a cold or flu. Do not treat yourself. This drug decreases your body's ability to fight infections. Try to avoid being around people who are sick. This medicine may increase your risk to bruise or bleed. Call your doctor or health care professional if you notice any unusual bleeding. Be careful brushing and flossing your teeth or using a toothpick because you may get an infection or bleed more easily. If you  have any dental work done, tell your dentist you are receiving this medicine. Avoid taking products that contain aspirin, acetaminophen, ibuprofen, naproxen, or ketoprofen unless instructed by your doctor. These medicines may hide a fever. Do not become pregnant while taking this medicine. Women should inform their doctor if they wish to become pregnant or think they might be pregnant. There is a potential for serious side effects to an unborn child. Talk to your health care professional or pharmacist for more information. Do not breast-feed an infant while taking this medicine. Men should inform their doctor if they wish to father a child. This medicine may lower sperm counts. Do not treat diarrhea with over the counter products. Contact your doctor if you have diarrhea that lasts more than 2 days or if it is severe and watery. This medicine can make you more sensitive to the sun. Keep out of the sun. If you cannot avoid being in the sun, wear protective clothing and use sunscreen. Do not use sun lamps or tanning beds/booths. What side effects may I notice from receiving this medicine? Side effects that you should report to your doctor or health care professional as soon as possible: -allergic reactions like skin rash, itching or hives, swelling of the face, lips, or tongue -low blood counts - this medicine may decrease the number of white blood cells, red blood cells and platelets. You may be at increased risk for infections and bleeding. -signs of infection -  fever or chills, cough, sore throat, pain or difficulty passing urine -signs of decreased platelets or bleeding - bruising, pinpoint red spots on the skin, black, tarry stools, blood in the urine -signs of decreased red blood cells - unusually weak or tired, fainting spells, lightheadedness -breathing problems -changes in vision -chest pain -mouth sores -nausea and vomiting -pain, swelling, redness at site where injected -pain, tingling,  numbness in the hands or feet -redness, swelling, or sores on hands or feet -stomach pain -unusual bleeding Side effects that usually do not require medical attention (report to your doctor or health care professional if they continue or are bothersome): -changes in finger or toe nails -diarrhea -dry or itchy skin -hair loss -headache -loss of appetite -sensitivity of eyes to the light -stomach upset -unusually teary eyes This list may not describe all possible side effects. Call your doctor for medical advice about side effects. You may report side effects to FDA at 1-800-FDA-1088. Where should I keep my medicine? This drug is given in a hospital or clinic and will not be stored at home. NOTE: This sheet is a summary. It may not cover all possible information. If you have questions about this medicine, talk to your doctor, pharmacist, or health care provider.  2019 [employer] Standard (2008-02-05 13:53:16) Leucovorin injection What is this medicine? LEUCOVORIN (loo koe VOR in) is used to prevent or treat the harmful effects of some medicines. This medicine is used to treat anemia caused by a low amount of folic acid in the body. It is also used with 5-fluorouracil (5-FU) to treat colon cancer. This medicine may be used for other purposes; ask your health care provider or pharmacist if you have questions. What should I tell my health care provider before I take this medicine? They need to know if you have any of these conditions: -anemia from low levels of vitamin B-12 in the blood -an unusual or allergic reaction to leucovorin, folic acid, other medicines, foods, dyes, or preservatives -pregnant or trying to get pregnant -breast-feeding How should I use this medicine? This medicine is for injection into a muscle or into a vein. It is given by a health care professional in a hospital or clinic setting. Talk to your pediatrician regarding the use of this medicine in children.  Special care may be needed. Overdosage: If you think you have taken too much of this medicine contact a poison control center or emergency room at once. NOTE: This medicine is only for you. Do not share this medicine with others. What if I miss a dose? This does not apply. What may interact with this medicine? -capecitabine -fluorouracil -phenobarbital -phenytoin -primidone -trimethoprim-sulfamethoxazole This list may not describe all possible interactions. Give your health care provider a list of all the medicines, herbs, non-prescription drugs, or dietary supplements you use. Also tell them if you smoke, drink alcohol, or use illegal drugs. Some items may interact with your medicine. What should I watch for while using this medicine? Your condition will be monitored carefully while you are receiving this medicine. This medicine may increase the side effects of 5-fluorouracil, 5-FU. Tell your doctor or health care professional if you have diarrhea or mouth sores that do not get better or that get worse. What side effects may I notice from receiving this medicine? Side effects that you should report to your doctor or health care professional as soon as possible: -allergic reactions like skin rash, itching or hives, swelling of the face, lips, or tongue -breathing problems -  fever, infection -mouth sores -unusual bleeding or bruising -unusually weak or tired Side effects that usually do not require medical attention (report to your doctor or health care professional if they continue or are bothersome): -constipation or diarrhea -loss of appetite -nausea, vomiting This list may not describe all possible side effects. Call your doctor for medical advice about side effects. You may report side effects to FDA at 1-800-FDA-1088. Where should I keep my medicine? This drug is given in a hospital or clinic and will not be stored at home. NOTE: This sheet is a summary. It may not cover all  possible information. If you have questions about this medicine, talk to your doctor, pharmacist, or health care provider.  2019 [employer] Standard (2008-04-07 16:50:29)

## 2019-03-19 ENCOUNTER — Other Ambulatory Visit: Payer: Self-pay

## 2019-03-19 ENCOUNTER — Ambulatory Visit: Payer: Medicare Other

## 2019-03-20 ENCOUNTER — Inpatient Hospital Stay: Payer: Medicare Other | Attending: Oncology

## 2019-03-20 DIAGNOSIS — C7982 Secondary malignant neoplasm of genital organs: Secondary | ICD-10-CM | POA: Insufficient documentation

## 2019-03-20 DIAGNOSIS — C20 Malignant neoplasm of rectum: Secondary | ICD-10-CM | POA: Insufficient documentation

## 2019-03-20 DIAGNOSIS — Z5111 Encounter for antineoplastic chemotherapy: Secondary | ICD-10-CM | POA: Insufficient documentation

## 2019-03-20 DIAGNOSIS — K219 Gastro-esophageal reflux disease without esophagitis: Secondary | ICD-10-CM | POA: Insufficient documentation

## 2019-03-20 DIAGNOSIS — E876 Hypokalemia: Secondary | ICD-10-CM | POA: Insufficient documentation

## 2019-03-20 DIAGNOSIS — Z8042 Family history of malignant neoplasm of prostate: Secondary | ICD-10-CM | POA: Insufficient documentation

## 2019-03-20 DIAGNOSIS — M129 Arthropathy, unspecified: Secondary | ICD-10-CM | POA: Insufficient documentation

## 2019-03-20 DIAGNOSIS — R634 Abnormal weight loss: Secondary | ICD-10-CM | POA: Insufficient documentation

## 2019-03-20 DIAGNOSIS — I10 Essential (primary) hypertension: Secondary | ICD-10-CM | POA: Insufficient documentation

## 2019-03-20 DIAGNOSIS — T451X5A Adverse effect of antineoplastic and immunosuppressive drugs, initial encounter: Secondary | ICD-10-CM | POA: Insufficient documentation

## 2019-03-20 DIAGNOSIS — K5900 Constipation, unspecified: Secondary | ICD-10-CM | POA: Insufficient documentation

## 2019-03-20 DIAGNOSIS — R5383 Other fatigue: Secondary | ICD-10-CM | POA: Insufficient documentation

## 2019-03-20 DIAGNOSIS — Z800 Family history of malignant neoplasm of digestive organs: Secondary | ICD-10-CM | POA: Insufficient documentation

## 2019-03-20 DIAGNOSIS — D6481 Anemia due to antineoplastic chemotherapy: Secondary | ICD-10-CM | POA: Insufficient documentation

## 2019-03-20 DIAGNOSIS — Z87891 Personal history of nicotine dependence: Secondary | ICD-10-CM | POA: Insufficient documentation

## 2019-03-20 DIAGNOSIS — R9389 Abnormal findings on diagnostic imaging of other specified body structures: Secondary | ICD-10-CM | POA: Insufficient documentation

## 2019-03-20 DIAGNOSIS — R609 Edema, unspecified: Secondary | ICD-10-CM | POA: Insufficient documentation

## 2019-03-20 DIAGNOSIS — Z79899 Other long term (current) drug therapy: Secondary | ICD-10-CM | POA: Insufficient documentation

## 2019-03-20 DIAGNOSIS — E7800 Pure hypercholesterolemia, unspecified: Secondary | ICD-10-CM | POA: Insufficient documentation

## 2019-03-20 DIAGNOSIS — Z933 Colostomy status: Secondary | ICD-10-CM | POA: Insufficient documentation

## 2019-03-24 ENCOUNTER — Ambulatory Visit (INDEPENDENT_AMBULATORY_CARE_PROVIDER_SITE_OTHER): Payer: Medicare Other | Admitting: Surgery

## 2019-03-24 ENCOUNTER — Other Ambulatory Visit: Payer: Self-pay

## 2019-03-24 ENCOUNTER — Encounter: Payer: Self-pay | Admitting: Surgery

## 2019-03-24 VITALS — BP 122/75 | HR 89 | Temp 97.9°F | Resp 14 | Ht 65.0 in | Wt 211.0 lb

## 2019-03-24 DIAGNOSIS — C20 Malignant neoplasm of rectum: Secondary | ICD-10-CM | POA: Diagnosis not present

## 2019-03-24 NOTE — Patient Instructions (Signed)
Patient's surgery to be scheduled for 04/03/19  at [facility] with Dr. Dahlia Byes.   The patient is aware she will need to Pre-Admit. Patient will check in at the [place] entrance due to COVID-19 restrictions and will then be escorted to the [place], [address] (first floor). She will also have Covid 19 testing done at that time.  Patient will be contacted once Pre-admission appointment has been arranged with date and time.   Patient aware to be NPO after midnight and have a driver.   She is aware to check in at the [place] entrance where he/she will be screened for the coronavirus and then sent to Same Day Surgery.   Patient aware that she may have no visitors and driver will need to wait in the car due to COVID-19 restrictions.   The patient verbalizes understanding of the above.   The patient is aware to call the office should she have further questions.

## 2019-03-25 ENCOUNTER — Encounter: Payer: Self-pay | Admitting: Surgery

## 2019-03-25 ENCOUNTER — Telehealth: Payer: Self-pay | Admitting: *Deleted

## 2019-03-25 NOTE — Progress Notes (Signed)
Patient ID: Jacqueline Yoder, female   DOB: 04-Mar-1946, 73 y.o.   MRN: [id number]  HPI Jacqueline Yoder is a 73 y.o. female to see in consultation by Dr. Tasia Catchings.  Briefly she is a 73 year old female with recently diagnosed metastatic rectal cancer requiring a diverting loop colostomy.  She does have metastatic disease within the lymph node.  He also has a fistulous process to the vagina.  Initially was thought to be a gynecological malignancy but the pathology change it to rectal in nature. She will start chemotherapy  And radiation soon.  She denies any fevers any chills.  No shortness of breath.  No previous manipulation of the central veins.  No previous ports.  HPI  Past Medical History:  Diagnosis Date  . Hypercholesteremia   . Hypertension   . Hypertension   . Urinary incontinence     Past Surgical History:  Procedure Laterality Date  . CHOLECYSTECTOMY  1971  . COLONOSCOPY WITH PROPOFOL N/A 12/03/2018   Procedure: COLONOSCOPY WITH PROPOFOL;  Surgeon: Lucilla Lame, MD;  Location: [facility] ENDOSCOPY;  Service: Endoscopy;  Laterality: N/A;  . FLEXIBLE SIGMOIDOSCOPY N/A 12/06/2018   Procedure: FLEXIBLE SIGMOIDOSCOPY;  Surgeon: [name], MD;  Location[facility] ENDOSCOPY;  Service: Endoscopy;  Laterality: N/A;    Family History  Problem Relation Age of Onset  . Colon cancer Brother   . Prostate cancer Brother   . Hypertension Mother   . Stroke Mother   . Kidney failure Father   . Breast cancer Neg Hx   . Ovarian cancer Neg Hx     Social History Social History   Tobacco Use  . Smoking status: Former Smoker    Last attempt to quit: 12/02/1977    Years since quitting: 41.3  . Smokeless tobacco: Never Used  Substance Use Topics  . Alcohol use: Never    Frequency: Never  . Drug use: Never    Allergies  Allergen Reactions  . Sulfamethoxazole-Trimethoprim Other (See Comments)    Also said she had chills and pressure in nose.    Current Outpatient Medications  Medication Sig Dispense  Refill  . capecitabine (XELODA) 150 MG tablet Take 1 tablet (150 mg total) by mouth 2 (two) times daily after a meal. Take with three 500mg  tablets. Take Mon-Fri, only on radiation days. (Patient not taking: Reported on 03/25/2019) 62 tablet 0  . capecitabine (XELODA) 500 MG tablet Take 3 tablets (1,500 mg total) by mouth 2 (two) times daily after a meal. Take with one 150mg  tablet. Take Mon-Fri, only on radiation days. (Patient not taking: Reported on 03/25/2019) 186 tablet 0  . Cholecalciferol (VITAMIN D3) 2000 units capsule Take 2,000 Units by mouth daily.    . diclofenac sodium (VOLTAREN) 1 % GEL Apply 2 g topically 4 (four) times daily as needed (joint pain).   11  . fexofenadine (ALLEGRA) 180 MG tablet Take 180 mg by mouth daily.    . fluticasone (FLONASE) 50 MCG/ACT nasal spray Place 1 spray into both nostrils 2 (two) times a day.     . ketorolac (ACULAR) 0.4 % SOLN Place 1 drop into both eyes 2 (two) times daily.     Marland Kitchen loperamide (IMODIUM) 2 MG capsule Take 1 capsule (2 mg total) by mouth See admin instructions. With onset of loose stool, take 4mg  followed by 2mg  every 2 hours,  Maximum: 16 mg/day 120 capsule 1  . Lutein 40 MG CAPS Take 40 mg by mouth daily.     Windy Kalata (BILBERRY  PLUS LUTEIN) 02-999 MCG-MG CAPS Take 1 capsule by mouth daily.     . Multiple Vitamins-Minerals (ONE-A-DAY WOMENS 50 PLUS PO) Take 1 tablet by mouth daily.     Marland Kitchen nystatin (MYCOSTATIN/NYSTOP) powder Apply topically 4 (four) times daily. 30 g 0  . ondansetron (ZOFRAN) 8 MG tablet Take 1 tablet (8 mg total) by mouth every 8 (eight) hours as needed for nausea, vomiting or refractory nausea / vomiting. 90 tablet 1  . pantoprazole (PROTONIX) 40 MG tablet Take 1 tablet (40 mg total) by mouth as needed. (Patient taking differently: Take 40 mg by mouth daily as needed (heartburn). ) 30 tablet 0  . potassium chloride SA (K-DUR) 20 MEQ tablet Take 1 tablet (20 mEq total) by mouth daily. 14 tablet 0  . prednisoLONE acetate  (PRED FORTE) 1 % ophthalmic suspension Place 1 drop into both eyes 2 (two) times a day.   1  . simvastatin (ZOCOR) 40 MG tablet Take 40 mg by mouth at bedtime.     . triamterene-hydrochlorothiazide (DYAZIDE) 37.5-25 MG capsule Take 1 capsule by mouth daily.      No current facility-administered medications for this visit.      Review of Systems Full ROS  was asked and was negative except for the information on the HPI  Physical Exam Blood pressure 122/75, pulse 89, temperature 97.9 F (36.6 C), resp. rate 14, height 5\' 5"  (1.651 m), weight 211 lb (95.7 kg), SpO2 96 %. CONSTITUTIONAL: NAD  EYES: Pupils are equal, round, and reactive to light, Sclera are non-icteric. EARS, NOSE, MOUTH AND THROAT: The oropharynx is clear. The oral mucosa is pink and moist. Hearing is intact to voice. LYMPH NODES:  Lymph nodes in the neck are normal. RESPIRATORY:  Lungs are clear. There is normal respiratory effort, with equal breath sounds bilaterally, and without pathologic use of accessory muscles. CARDIOVASCULAR: Heart is regular without murmurs, gallops, or rubs. GI: The abdomen is soft, nontender, and nondistended. There are no palpable masses. There is no hepatosplenomegaly. There are normal bowel sounds in all quadrants. Colostomy in place and patent GU: Rectal deferred.   MUSCULOSKELETAL: Normal muscle strength and tone. No cyanosis or edema.   SKIN: Turgor is good and there are no pathologic skin lesions or ulcers. NEUROLOGIC: Motor and sensation is grossly normal. Cranial nerves are grossly intact. PSYCH:  Oriented to person, place and time. Affect is normal.  Data Reviewed  I have personally reviewed the patient's imaging, laboratory findings and medical records.    Assessment/Plan  73 year old female recently diagnosed with metastatic rectal cancer in need for from chemotherapy and port placement.  I have discussed with the patient detail about the procedure.  Risk benefit and possible  implications including but not limited to: Bleeding, infection, injury to major vessels, pneumothorax and re-interventions.  She understands and wishes to proceed.  We will tentatively schedule her for next week as the OR schedule will allow it.  Caroleen Hamman, MD FACS General Surgeon 03/25/2019, 11:44 AM

## 2019-03-25 NOTE — Telephone Encounter (Signed)
Patient contacted today and surgery date confirmed for 04-03-19 with Dr. Dahlia Byes at [facility].   The patient is aware that she will not need to go in for an office pre-admit appointment. The patient will be contacted for a phone interview tomorrow afternoon- 03-26-19.  The patient is aware to have COVID-19 testing done on 03-31-19 at the [facility] building drive thru ([address] [place]) between 10:30 am and 12:30 pm. She is aware to isolate after, have no visitors, wash hands frequently, and avoid touching face.   The patient is aware to call the office should she have further questions.

## 2019-03-25 NOTE — Addendum Note (Signed)
Addended by: Caroleen Hamman F on: 03/25/2019 11:59 AM   Modules accepted: Orders, SmartSet

## 2019-03-25 NOTE — H&P (View-Only) (Signed)
Patient ID: Jacqueline Yoder, female   DOB: 1946/09/07, 72 y.o.   MRN: [id number]  HPI Jacqueline Yoder is a 73 y.o. female to see in consultation by Dr. Tasia Catchings.  Briefly she is a 73 year old female with recently diagnosed metastatic rectal cancer requiring a diverting loop colostomy.  She does have metastatic disease within the lymph node.  He also has a fistulous process to the vagina.  Initially was thought to be a gynecological malignancy but the pathology change it to rectal in nature. She will start chemotherapy  And radiation soon.  She denies any fevers any chills.  No shortness of breath.  No previous manipulation of the central veins.  No previous ports.  HPI  Past Medical History:  Diagnosis Date  . Hypercholesteremia   . Hypertension   . Hypertension   . Urinary incontinence     Past Surgical History:  Procedure Laterality Date  . CHOLECYSTECTOMY  1971  . COLONOSCOPY WITH PROPOFOL N/A 12/03/2018   Procedure: COLONOSCOPY WITH PROPOFOL;  Surgeon: Lucilla Lame, MD;  Location: [facility] ENDOSCOPY;  Service: Endoscopy;  Laterality: N/A;  . FLEXIBLE SIGMOIDOSCOPY N/A 12/06/2018   Procedure: FLEXIBLE SIGMOIDOSCOPY;  Surgeon: [name], MD;  Location: [facility] ENDOSCOPY;  Service: Endoscopy;  Laterality: N/A;    Family History  Problem Relation Age of Onset  . Colon cancer Brother   . Prostate cancer Brother   . Hypertension Mother   . Stroke Mother   . Kidney failure Father   . Breast cancer Neg Hx   . Ovarian cancer Neg Hx     Social History Social History   Tobacco Use  . Smoking status: Former Smoker    Last attempt to quit: 12/02/1977    Years since quitting: 41.3  . Smokeless tobacco: Never Used  Substance Use Topics  . Alcohol use: Never    Frequency: Never  . Drug use: Never    Allergies  Allergen Reactions  . Sulfamethoxazole-Trimethoprim Other (See Comments)    Also said she had chills and pressure in nose.    Current Outpatient Medications  Medication Sig Dispense  Refill  . capecitabine (XELODA) 150 MG tablet Take 1 tablet (150 mg total) by mouth 2 (two) times daily after a meal. Take with three 500mg  tablets. Take Mon-Fri, only on radiation days. (Patient not taking: Reported on 03/25/2019) 62 tablet 0  . capecitabine (XELODA) 500 MG tablet Take 3 tablets (1,500 mg total) by mouth 2 (two) times daily after a meal. Take with one 150mg  tablet. Take Mon-Fri, only on radiation days. (Patient not taking: Reported on 03/25/2019) 186 tablet 0  . Cholecalciferol (VITAMIN D3) 2000 units capsule Take 2,000 Units by mouth daily.    . diclofenac sodium (VOLTAREN) 1 % GEL Apply 2 g topically 4 (four) times daily as needed (joint pain).   11  . fexofenadine (ALLEGRA) 180 MG tablet Take 180 mg by mouth daily.    . fluticasone (FLONASE) 50 MCG/ACT nasal spray Place 1 spray into both nostrils 2 (two) times a day.     . ketorolac (ACULAR) 0.4 % SOLN Place 1 drop into both eyes 2 (two) times daily.     Marland Kitchen loperamide (IMODIUM) 2 MG capsule Take 1 capsule (2 mg total) by mouth See admin instructions. With onset of loose stool, take 4mg  followed by 2mg  every 2 hours,  Maximum: 16 mg/day 120 capsule 1  . Lutein 40 MG CAPS Take 40 mg by mouth daily.     Windy Kalata (BILBERRY  PLUS LUTEIN) 02-999 MCG-MG CAPS Take 1 capsule by mouth daily.     . Multiple Vitamins-Minerals (ONE-A-DAY WOMENS 50 PLUS PO) Take 1 tablet by mouth daily.     Marland Kitchen nystatin (MYCOSTATIN/NYSTOP) powder Apply topically 4 (four) times daily. 30 g 0  . ondansetron (ZOFRAN) 8 MG tablet Take 1 tablet (8 mg total) by mouth every 8 (eight) hours as needed for nausea, vomiting or refractory nausea / vomiting. 90 tablet 1  . pantoprazole (PROTONIX) 40 MG tablet Take 1 tablet (40 mg total) by mouth as needed. (Patient taking differently: Take 40 mg by mouth daily as needed (heartburn). ) 30 tablet 0  . potassium chloride SA (K-DUR) 20 MEQ tablet Take 1 tablet (20 mEq total) by mouth daily. 14 tablet 0  . prednisoLONE acetate  (PRED FORTE) 1 % ophthalmic suspension Place 1 drop into both eyes 2 (two) times a day.   1  . simvastatin (ZOCOR) 40 MG tablet Take 40 mg by mouth at bedtime.     . triamterene-hydrochlorothiazide (DYAZIDE) 37.5-25 MG capsule Take 1 capsule by mouth daily.      No current facility-administered medications for this visit.      Review of Systems Full ROS  was asked and was negative except for the information on the HPI  Physical Exam Blood pressure 122/75, pulse 89, temperature 97.9 F (36.6 C), resp. rate 14, height 5\' 5"  (1.651 m), weight 211 lb (95.7 kg), SpO2 96 %. CONSTITUTIONAL: NAD  EYES: Pupils are equal, round, and reactive to light, Sclera are non-icteric. EARS, NOSE, MOUTH AND THROAT: The oropharynx is clear. The oral mucosa is pink and moist. Hearing is intact to voice. LYMPH NODES:  Lymph nodes in the neck are normal. RESPIRATORY:  Lungs are clear. There is normal respiratory effort, with equal breath sounds bilaterally, and without pathologic use of accessory muscles. CARDIOVASCULAR: Heart is regular without murmurs, gallops, or rubs. GI: The abdomen is soft, nontender, and nondistended. There are no palpable masses. There is no hepatosplenomegaly. There are normal bowel sounds in all quadrants. Colostomy in place and patent GU: Rectal deferred.   MUSCULOSKELETAL: Normal muscle strength and tone. No cyanosis or edema.   SKIN: Turgor is good and there are no pathologic skin lesions or ulcers. NEUROLOGIC: Motor and sensation is grossly normal. Cranial nerves are grossly intact. PSYCH:  Oriented to person, place and time. Affect is normal.  Data Reviewed  I have personally reviewed the patient's imaging, laboratory findings and medical records.    Assessment/Plan  73 year old female recently diagnosed with metastatic rectal cancer in need for from chemotherapy and port placement.  I have discussed with the patient detail about the procedure.  Risk benefit and possible  implications including but not limited to: Bleeding, infection, injury to major vessels, pneumothorax and re-interventions.  She understands and wishes to proceed.  We will tentatively schedule her for next week as the OR schedule will allow it.  Caroleen Hamman, MD FACS General Surgeon 03/25/2019, 11:44 AM

## 2019-03-26 ENCOUNTER — Other Ambulatory Visit: Payer: Self-pay

## 2019-03-26 ENCOUNTER — Encounter
Admission: RE | Admit: 2019-03-26 | Discharge: 2019-03-26 | Disposition: A | Discharge status: Home / Self Care | Payer: Medicare Other | Source: Ambulatory Visit | Attending: Surgery | Admitting: Surgery

## 2019-03-26 HISTORY — DX: Gastro-esophageal reflux disease without esophagitis: K21.9

## 2019-03-26 HISTORY — DX: Unspecified osteoarthritis, unspecified site: M19.90

## 2019-03-26 HISTORY — DX: Localized edema: R60.0

## 2019-03-26 NOTE — Patient Instructions (Signed)
Your procedure is scheduled on: 04/03/2019 Thurs Report to Same Day Surgery 2nd floor medical mall [facility] Entrance-take elevator on left to 2nd floor.  Check in with surgery information desk.) To find out your arrival time please call [phone] between 1PM - 3PM on 04/02/2019  wed  Remember: Instructions that are not followed completely may result in serious medical risk, up to and including death, or upon the discretion of your surgeon and anesthesiologist your surgery may need to be rescheduled.    _x___ 1. Do not eat food after midnight the night before your procedure. You may drink clear liquids up to 2 hours before you are scheduled to arrive at the hospital for your procedure.  Do not drink clear liquids within 2 hours of your scheduled arrival to the hospital.  Clear liquids include  --Water or Apple juice without pulp  --Clear carbohydrate beverage such as ClearFast or Gatorade  --Black Coffee or Clear Tea (No milk, no creamers, do not add anything to                  the coffee or Tea Type 1 and type 2 diabetics should only drink water.   ____Ensure clear carbohydrate drink on the way to the hospital for bariatric patients  ____Ensure clear carbohydrate drink 3 hours before surgery for Dr Dwyane Luo patients if physician instructed.   No gum chewing or hard candies.     __x__ 2. No Alcohol for 24 hours before or after surgery.   __x__3. No Smoking or e-cigarettes for 24 prior to surgery.  Do not use any chewable tobacco products for at least 6 hour prior to surgery   ____  4. Bring all medications with you on the day of surgery if instructed.    __x__ 5. Notify your doctor if there is any change in your medical condition     (cold, fever, infections).    x___6. On the morning of surgery brush your teeth with toothpaste and water.  You may rinse your mouth with mouth wash if you wish.  Do not swallow any toothpaste or mouthwash.   Do not wear jewelry, make-up,  hairpins, clips or nail polish.  Do not wear lotions, powders, or perfumes. You may wear deodorant.  Do not shave 48 hours prior to surgery. Men may shave face and neck.  Do not bring valuables to the hospital.    [facility] is not responsible for any belongings or valuables.               Contacts, dentures or bridgework may not be worn into surgery.  Leave your suitcase in the car. After surgery it may be brought to your room.  For patients admitted to the hospital, discharge time is determined by your                       treatment team.  _  Patients discharged the day of surgery will not be allowed to drive home.  You will need someone to drive you home and stay with you the night of your procedure.    Please read over the following fact sheets that you were given:   [facility] Preparing for Surgery and or MRSA Information   _x___ Take anti-hypertensive listed below, cardiac, seizure, asthma,     anti-reflux and psychiatric medicines. These include:  1. fexofenadine (ALLEGRA) 180 MG tabletfexofenadine (ALLEGRA) 180 MG tablet  2.fluticasone (FLONASE) 50 MCG/ACT   3.pantoprazole (PROTONIX)  40 MG tablet  4.ketorolac (ACULAR) 0.4 % SOLN  5.prednisoLONE acetate (PRED FORTE) 1 % ophthalmic suspension  6.  ____Fleets enema or Magnesium Citrate as directed.   _x___ Use CHG Soap or sage wipes as directed on instruction sheet   ____ Use inhalers on the day of surgery and bring to hospital day of surgery  ____ Stop Metformin and Janumet 2 days prior to surgery.    ____ Take 1/2 of usual insulin dose the night before surgery and none on the morning     surgery.   _x___ Follow recommendations from Cardiologist, Pulmonologist or PCP regarding          stopping Aspirin, Coumadin, Plavix ,Eliquis, Effient, or Pradaxa, and Pletal.  X____Stop Anti-inflammatories such as Advil, Aleve, Ibuprofen, Motrin, Naproxen, Naprosyn, Goodies powders or aspirin products. OK to take Tylenol and                           Celebrex.   _x___ Stop supplements until after surgery.  But may continue Vitamin D, Vitamin B,       and multivitamin.   ____ Bring C-Pap to the hospital.

## 2019-03-31 ENCOUNTER — Other Ambulatory Visit
Admission: RE | Admit: 2019-03-31 | Discharge: 2019-03-31 | Disposition: A | Discharge status: Home / Self Care | Payer: Medicare Other | Source: Ambulatory Visit | Attending: Surgery | Admitting: Surgery

## 2019-03-31 DIAGNOSIS — Z1159 Encounter for screening for other viral diseases: Secondary | ICD-10-CM | POA: Insufficient documentation

## 2019-04-01 LAB — NOVEL CORONAVIRUS, NAA (HOSP ORDER, SEND-OUT TO REF LAB; TAT 18-24 HRS): SARS-CoV-2, NAA: NOT DETECTED

## 2019-04-03 ENCOUNTER — Ambulatory Visit: Payer: Medicare Other

## 2019-04-03 ENCOUNTER — Other Ambulatory Visit: Payer: Self-pay

## 2019-04-03 ENCOUNTER — Ambulatory Visit
Admission: RE | Admit: 2019-04-03 | Discharge: 2019-04-03 | Disposition: A | Discharge status: Home / Self Care | Payer: Medicare Other | Attending: Surgery | Admitting: Surgery

## 2019-04-03 ENCOUNTER — Other Ambulatory Visit: Payer: Medicare Other

## 2019-04-03 ENCOUNTER — Ambulatory Visit: Payer: Medicare Other | Admitting: Anesthesiology

## 2019-04-03 ENCOUNTER — Encounter: Payer: Self-pay | Admitting: *Deleted

## 2019-04-03 ENCOUNTER — Ambulatory Visit
Admission: RE | Admit: 2019-04-03 | Discharge: 2019-04-03 | Disposition: A | Discharge status: Home / Self Care | Payer: Medicare Other | Source: Home / Self Care | Attending: Surgery | Admitting: Surgery

## 2019-04-03 ENCOUNTER — Encounter
Admission: RE | Disposition: A | Discharge status: Home / Self Care | Payer: Self-pay | Source: Home / Self Care | Attending: Surgery

## 2019-04-03 ENCOUNTER — Other Ambulatory Visit: Payer: Self-pay | Admitting: Oncology

## 2019-04-03 DIAGNOSIS — Z6835 Body mass index (BMI) 35.0-35.9, adult: Secondary | ICD-10-CM | POA: Insufficient documentation

## 2019-04-03 DIAGNOSIS — Z791 Long term (current) use of non-steroidal anti-inflammatories (NSAID): Secondary | ICD-10-CM | POA: Diagnosis not present

## 2019-04-03 DIAGNOSIS — Z87891 Personal history of nicotine dependence: Secondary | ICD-10-CM | POA: Diagnosis not present

## 2019-04-03 DIAGNOSIS — Z79899 Other long term (current) drug therapy: Secondary | ICD-10-CM | POA: Diagnosis not present

## 2019-04-03 DIAGNOSIS — C20 Malignant neoplasm of rectum: Secondary | ICD-10-CM | POA: Diagnosis not present

## 2019-04-03 DIAGNOSIS — I10 Essential (primary) hypertension: Secondary | ICD-10-CM | POA: Diagnosis not present

## 2019-04-03 DIAGNOSIS — E7800 Pure hypercholesterolemia, unspecified: Secondary | ICD-10-CM | POA: Diagnosis not present

## 2019-04-03 DIAGNOSIS — E669 Obesity, unspecified: Secondary | ICD-10-CM | POA: Insufficient documentation

## 2019-04-03 DIAGNOSIS — Z452 Encounter for adjustment and management of vascular access device: Secondary | ICD-10-CM | POA: Insufficient documentation

## 2019-04-03 HISTORY — PX: PORTACATH PLACEMENT: SHX2246

## 2019-04-03 SURGERY — INSERTION, TUNNELED CENTRAL VENOUS DEVICE, WITH PORT
Anesthesia: General

## 2019-04-03 MED ORDER — FENTANYL CITRATE (PF) 100 MCG/2ML IJ SOLN: 25.0000 ug | INTRAMUSCULAR | Status: DC | PRN

## 2019-04-03 MED ORDER — MEPERIDINE HCL 50 MG/ML IJ SOLN: 6.2500 mg | INTRAMUSCULAR | Status: DC | PRN

## 2019-04-03 MED ORDER — PROPOFOL 10 MG/ML IV BOLUS
INTRAVENOUS | Status: AC
  Filled 2019-04-03: qty 20

## 2019-04-03 MED ORDER — HYDROCODONE-ACETAMINOPHEN 7.5-325 MG PO TABS: 1.0000 | ORAL_TABLET | Freq: Once | ORAL | Status: DC | PRN

## 2019-04-03 MED ORDER — ACETAMINOPHEN 160 MG/5ML PO SOLN
325.0000 mg | ORAL | Status: DC | PRN
  Filled 2019-04-03: qty 10.2

## 2019-04-03 MED ORDER — PROPOFOL 500 MG/50ML IV EMUL
INTRAVENOUS | Status: DC | PRN
  Administered 2019-04-03: 50 ug/kg/min via INTRAVENOUS

## 2019-04-03 MED ORDER — SODIUM CHLORIDE 0.9 % IV SOLN
INTRAVENOUS | Status: DC | PRN
  Administered 2019-04-03: 2 mL via INTRAMUSCULAR

## 2019-04-03 MED ORDER — MIDAZOLAM HCL 2 MG/2ML IJ SOLN
INTRAMUSCULAR | Status: DC | PRN
  Administered 2019-04-03: 1 mg via INTRAVENOUS

## 2019-04-03 MED ORDER — BUPIVACAINE-EPINEPHRINE (PF) 0.25% -1:200000 IJ SOLN
INTRAMUSCULAR | Status: DC | PRN
  Administered 2019-04-03: 5 mL

## 2019-04-03 MED ORDER — CEFAZOLIN SODIUM-DEXTROSE 2-4 GM/100ML-% IV SOLN
2.0000 g | INTRAVENOUS | Status: AC
  Administered 2019-04-03: 2 g via INTRAVENOUS

## 2019-04-03 MED ORDER — LIDOCAINE HCL (PF) 1 % IJ SOLN
INTRAMUSCULAR | Status: DC | PRN
  Administered 2019-04-03: 5 mL

## 2019-04-03 MED ORDER — PROPOFOL 10 MG/ML IV BOLUS
INTRAVENOUS | Status: DC | PRN
  Administered 2019-04-03: 20 mg via INTRAVENOUS

## 2019-04-03 MED ORDER — CHLORHEXIDINE GLUCONATE CLOTH 2 % EX PADS: 6.0000 | MEDICATED_PAD | Freq: Once | CUTANEOUS | Status: DC

## 2019-04-03 MED ORDER — LIDOCAINE HCL (PF) 2 % IJ SOLN
INTRAMUSCULAR | Status: AC
  Filled 2019-04-03: qty 10

## 2019-04-03 MED ORDER — ACETAMINOPHEN 325 MG PO TABS: 325.0000 mg | ORAL_TABLET | ORAL | Status: DC | PRN

## 2019-04-03 MED ORDER — CHLORHEXIDINE GLUCONATE CLOTH 2 % EX PADS
6.0000 | MEDICATED_PAD | Freq: Once | CUTANEOUS | Status: AC
  Administered 2019-04-03: 6 via TOPICAL

## 2019-04-03 MED ORDER — CEFAZOLIN SODIUM-DEXTROSE 2-4 GM/100ML-% IV SOLN
INTRAVENOUS | Status: AC
  Filled 2019-04-03: qty 100

## 2019-04-03 MED ORDER — FENTANYL CITRATE (PF) 100 MCG/2ML IJ SOLN
INTRAMUSCULAR | Status: DC | PRN
  Administered 2019-04-03 (×2): 25 ug via INTRAVENOUS

## 2019-04-03 MED ORDER — LACTATED RINGERS IV SOLN
INTRAVENOUS | Status: DC
  Administered 2019-04-03: 10:00:00 via INTRAVENOUS

## 2019-04-03 MED ORDER — FENTANYL CITRATE (PF) 100 MCG/2ML IJ SOLN
INTRAMUSCULAR | Status: AC
  Filled 2019-04-03: qty 2

## 2019-04-03 MED ORDER — BUPIVACAINE-EPINEPHRINE (PF) 0.25% -1:200000 IJ SOLN
INTRAMUSCULAR | Status: AC
  Filled 2019-04-03: qty 30

## 2019-04-03 MED ORDER — LIDOCAINE HCL (PF) 1 % IJ SOLN
INTRAMUSCULAR | Status: AC
  Filled 2019-04-03: qty 30

## 2019-04-03 MED ORDER — ONDANSETRON HCL 4 MG/2ML IJ SOLN
INTRAMUSCULAR | Status: AC
  Filled 2019-04-03: qty 2

## 2019-04-03 MED ORDER — MIDAZOLAM HCL 2 MG/2ML IJ SOLN
INTRAMUSCULAR | Status: AC
  Filled 2019-04-03: qty 2

## 2019-04-03 MED ORDER — HYDROCODONE-ACETAMINOPHEN 5-325 MG PO TABS: 1.0000 | ORAL_TABLET | ORAL | 0 refills | Status: DC | PRN

## 2019-04-03 MED ORDER — PROMETHAZINE HCL 25 MG/ML IJ SOLN: 6.2500 mg | INTRAMUSCULAR | Status: DC | PRN

## 2019-04-03 MED ORDER — HEPARIN SODIUM (PORCINE) 5000 UNIT/ML IJ SOLN
INTRAMUSCULAR | Status: AC
  Filled 2019-04-03: qty 1

## 2019-04-03 MED ORDER — ONDANSETRON HCL 4 MG/2ML IJ SOLN
INTRAMUSCULAR | Status: DC | PRN
  Administered 2019-04-03: 4 mg via INTRAVENOUS

## 2019-04-03 MED ORDER — PHENYLEPHRINE HCL (PRESSORS) 10 MG/ML IV SOLN
INTRAVENOUS | Status: DC | PRN
  Administered 2019-04-03 (×2): 100 ug via INTRAVENOUS
  Administered 2019-04-03: 150 ug via INTRAVENOUS
  Administered 2019-04-03 (×3): 200 ug via INTRAVENOUS
  Administered 2019-04-03: 100 ug via INTRAVENOUS

## 2019-04-03 MED ORDER — LIDOCAINE-PRILOCAINE 2.5-2.5 % EX CREA: 1.0000 "application " | TOPICAL_CREAM | CUTANEOUS | 6 refills | Status: DC | PRN

## 2019-04-03 SURGICAL SUPPLY — 35 items
BAG DECANTER FOR FLEXI CONT (MISCELLANEOUS) ×3 IMPLANT
BLADE SURG SZ11 CARB STEEL (BLADE) ×3 IMPLANT
BOOT SUTURE AID YELLOW STND (SUTURE) ×3 IMPLANT
CANISTER SUCT 1200ML W/VALVE (MISCELLANEOUS) ×3 IMPLANT
CHLORAPREP W/TINT 26 (MISCELLANEOUS) ×3 IMPLANT
COVER LIGHT HANDLE STERIS (MISCELLANEOUS) ×6 IMPLANT
COVER WAND RF STERILE (DRAPES) ×3 IMPLANT
DERMABOND ADVANCED (GAUZE/BANDAGES/DRESSINGS) ×2
DERMABOND ADVANCED .7 DNX12 (GAUZE/BANDAGES/DRESSINGS) ×1 IMPLANT
DRAPE C-ARM XRAY 36X54 (DRAPES) ×6 IMPLANT
DRAPE INCISE IOBAN 66X45 STRL (DRAPES) ×3 IMPLANT
DRAPE SHEET LG 3/4 BI-LAMINATE (DRAPES) ×3 IMPLANT
ELECT CAUTERY BLADE 6.4 (BLADE) ×3 IMPLANT
ELECT REM PT RETURN 9FT ADLT (ELECTROSURGICAL) ×3
ELECTRODE REM PT RTRN 9FT ADLT (ELECTROSURGICAL) ×1 IMPLANT
GEL ULTRASOUND 20GR AQUASONIC (MISCELLANEOUS) ×3 IMPLANT
GLOVE BIO SURGEON STRL SZ7 (GLOVE) ×3 IMPLANT
GOWN STRL REUS W/ TWL LRG LVL3 (GOWN DISPOSABLE) ×2 IMPLANT
GOWN STRL REUS W/TWL LRG LVL3 (GOWN DISPOSABLE) ×4
IV NS 500ML (IV SOLUTION) ×2
IV NS 500ML BAXH (IV SOLUTION) ×1 IMPLANT
KIT PORT POWER 8FR ISP CVUE (Port) ×3 IMPLANT
NEEDLE HYPO 22GX1.5 SAFETY (NEEDLE) ×3 IMPLANT
NS IRRIG 1000ML POUR BTL (IV SOLUTION) ×3 IMPLANT
PACK PORT-A-CATH (MISCELLANEOUS) ×3 IMPLANT
SPONGE LAP 18X18 RF (DISPOSABLE) ×3 IMPLANT
SUT MNCRL AB 4-0 PS2 18 (SUTURE) ×3 IMPLANT
SUT PROLENE 2-0 (SUTURE) ×2
SUT PROLENE 2-0 RB1 36X2 ARM (SUTURE) ×1
SUT VIC AB 3-0 SH 27 (SUTURE) ×2
SUT VIC AB 3-0 SH 27X BRD (SUTURE) ×1 IMPLANT
SUTURE PROLEN 2-0 RB1 36X2 ARM (SUTURE) ×1 IMPLANT
SYR 20CC LL (SYRINGE) ×3 IMPLANT
SYR 5ML LL (SYRINGE) ×3 IMPLANT
TOWEL OR 17X26 4PK STRL BLUE (TOWEL DISPOSABLE) ×3 IMPLANT

## 2019-04-03 NOTE — Discharge Instructions (Addendum)

## 2019-04-03 NOTE — Op Note (Signed)
  Pre-operative Diagnosis: Rectal Ca  Post-operative Diagnosis: same   Surgeon: Caroleen Hamman, MD FACS  Anesthesia: IV sedation, marcaine .25% w epi and lidocaine 1%  Assistant: Otho Ket PA-C required due to the lack of qualified first assist  Procedure: right IJ  Port placement with fluoroscopy under U/S guidance  Findings: Good position of the tip of the catheter by fluoroscopy  Estimated Blood Loss: Minimal         Drains: None         Specimens: None       Complications: none          Procedure Details  The patient was seen again in the Holding Room. The benefits, complications, treatment options, and expected outcomes were discussed with the patient. The risks of bleeding, infection, recurrence of symptoms, failure to resolve symptoms,  thrombosis nonfunction breakage pneumothorax hemopneumothorax any of which could require chest tube or further surgery were reviewed with the patient.   The patient was taken to Operating Room, identified as Jacqueline Yoder and the procedure verified.  A Time Out was held and the above information confirmed.  Prior to the induction of general anesthesia, antibiotic prophylaxis was administered. VTE prophylaxis was in place. Appropriate anesthesia was then administered and tolerated well. The chest was prepped with Chloraprep and draped in the sterile fashion. The patient was positioned in the supine position. Then the patient was placed in Trendelenburg position.  Patient was prepped and draped in sterile fashion and in a Trendelenburg position local anesthetic was infiltrated into the skin and subcutaneous tissues in the neck and anterior chest wall. The large bore needle was placed into the internal jugular vein under U/S guidance without difficulty and then the Seldinger wire was advanced. Fluoroscopy was utilized to confirm that the Seldinger wire was in the superior vena cava.  An incision was made and a port pocket developed with blunt and  electrocautery dissection. The introducer dilator was placed over the Seldinger wire the wire was removed. The previously flushed catheter was placed into the introducer dilator and the peel-away sheath was removed. The catheter length was confirmed and trimmed utilizing fluoroscopy for proper positioning. The catheter was then attached to the previously flushed port. The port was placed into the pocket. The port was held in with 2-0 Prolenes and flushed for function and heparin locked.  The wound was closed with interrupted 3-0 Vicryl followed by 4-0 subcuticular Monocryl sutures. Dermabond used to coat the skin  Patient was taken to the recovery room in stable condition where a postoperative chest film has been ordered.

## 2019-04-03 NOTE — Progress Notes (Signed)
K+ level of 03/13/19 brought to the attention of Dr. Lavone Neri (anesthesia); no new orders, ok to proceed to surgery.

## 2019-04-03 NOTE — Transfer of Care (Signed)
Immediate Anesthesia Transfer of Care Note  Patient: Jacqueline Yoder  Procedure(s) Performed: INSERTION PORT-A-CATH (N/A )  Patient Location: PACU  Anesthesia Type:General  Level of Consciousness: drowsy  Airway & Oxygen Therapy: Patient Spontanous Breathing and Patient connected to face mask oxygen  Post-op Assessment: Report given to RN and Post -op Vital signs reviewed and stable  Post vital signs: stable  Last Vitals:  Vitals Value Taken Time  BP 121/66 04/03/19 1109  Temp    Pulse 71 04/03/19 1111  Resp 15 04/03/19 1111  SpO2 100 % 04/03/19 1111    Last Pain:  Vitals:   04/03/19 0913  TempSrc: Temporal  PainSc: 0-No pain         Complications: No apparent anesthesia complications

## 2019-04-03 NOTE — Anesthesia Post-op Follow-up Note (Signed)
Anesthesia QCDR form completed.        

## 2019-04-03 NOTE — Interval H&P Note (Signed)
History and Physical Interval Note:  04/03/2019 9:25 AM  Jacqueline Yoder  has presented today for surgery, with the diagnosis of RECTAL CANCER.  The various methods of treatment have been discussed with the patient and family. After consideration of risks, benefits and other options for treatment, the patient has consented to  Procedure(s): INSERTION PORT-A-CATH (N/A) as a surgical intervention.  The patient's history has been reviewed, patient examined, no change in status, stable for surgery.  I have reviewed the patient's chart and labs.  Questions were answered to the patient's satisfaction.     Yell

## 2019-04-03 NOTE — Anesthesia Preprocedure Evaluation (Signed)
Anesthesia Evaluation  Patient identified by MRN, date of birth, ID band Patient awake    Reviewed: Allergy & Precautions, H&P , NPO status , reviewed documented beta blocker date and time   Airway Mallampati: III  TM Distance: >3 FB Neck ROM: full    Dental  (+) Upper Dentures   Pulmonary former smoker,    Pulmonary exam normal        Cardiovascular hypertension, Normal cardiovascular exam     Neuro/Psych    GI/Hepatic GERD  Controlled,  Endo/Other  Morbid obesity  Renal/GU Renal disease     Musculoskeletal  (+) Arthritis ,   Abdominal   Peds  Hematology   Anesthesia Other Findings Past Medical History: No date: Arthritis No date: Cancer ([place])     Comment:  rectal No date: GERD (gastroesophageal reflux disease) No date: Hypercholesteremia No date: Hypertension No date: Hypertension No date: Lower extremity edema No date: Urinary incontinence  Past Surgical History: 1971: CHOLECYSTECTOMY 12/03/2018: COLONOSCOPY WITH PROPOFOL; N/A     Comment:  Procedure: COLONOSCOPY WITH PROPOFOL;  Surgeon: Lucilla Lame, MD;  Location: [facility] ENDOSCOPY;  Service:               Endoscopy;  Laterality: N/A; 12/06/2018: FLEXIBLE SIGMOIDOSCOPY; N/A     Comment:  Procedure: FLEXIBLE SIGMOIDOSCOPY;  Surgeon: [name], MD;  Location: [facility] ENDOSCOPY;  Service:               Endoscopy;  Laterality: N/A; 01/06/2019: LAPAROSCOPIC COLOSTOMY  BMI    Body Mass Index: 35.45 kg/m      Reproductive/Obstetrics                             Anesthesia Physical Anesthesia Plan  ASA: III  Anesthesia Plan: General   Post-op Pain Management:    Induction: Intravenous  PONV Risk Score and Plan: 3 and Treatment may vary due to age or medical condition and TIVA  Airway Management Planned: Nasal Cannula and Natural Airway  Additional Equipment:   Intra-op Plan:    Post-operative Plan:   Informed Consent: I have reviewed the patients History and Physical, chart, labs and discussed the procedure including the risks, benefits and alternatives for the proposed anesthesia with the patient or authorized representative who has indicated his/her understanding and acceptance.     Dental Advisory Given  Plan Discussed with: CRNA  Anesthesia Plan Comments:         Anesthesia Quick Evaluation

## 2019-04-04 ENCOUNTER — Other Ambulatory Visit: Payer: Medicare Other

## 2019-04-04 ENCOUNTER — Encounter: Payer: Self-pay | Admitting: Surgery

## 2019-04-07 ENCOUNTER — Inpatient Hospital Stay (HOSPITAL_BASED_OUTPATIENT_CLINIC_OR_DEPARTMENT_OTHER): Payer: Medicare Other | Admitting: Oncology

## 2019-04-07 ENCOUNTER — Other Ambulatory Visit: Payer: Self-pay

## 2019-04-07 ENCOUNTER — Encounter: Payer: Self-pay | Admitting: Oncology

## 2019-04-07 ENCOUNTER — Other Ambulatory Visit: Payer: Medicare Other

## 2019-04-07 ENCOUNTER — Inpatient Hospital Stay: Payer: Medicare Other

## 2019-04-07 VITALS — BP 130/79 | HR 77 | Temp 96.9°F | Resp 18 | Wt 214.2 lb

## 2019-04-07 DIAGNOSIS — I10 Essential (primary) hypertension: Secondary | ICD-10-CM

## 2019-04-07 DIAGNOSIS — C20 Malignant neoplasm of rectum: Secondary | ICD-10-CM

## 2019-04-07 DIAGNOSIS — M129 Arthropathy, unspecified: Secondary | ICD-10-CM | POA: Diagnosis not present

## 2019-04-07 DIAGNOSIS — Z933 Colostomy status: Secondary | ICD-10-CM | POA: Diagnosis not present

## 2019-04-07 DIAGNOSIS — K219 Gastro-esophageal reflux disease without esophagitis: Secondary | ICD-10-CM | POA: Diagnosis not present

## 2019-04-07 DIAGNOSIS — R9389 Abnormal findings on diagnostic imaging of other specified body structures: Secondary | ICD-10-CM | POA: Diagnosis not present

## 2019-04-07 DIAGNOSIS — Z800 Family history of malignant neoplasm of digestive organs: Secondary | ICD-10-CM | POA: Diagnosis not present

## 2019-04-07 DIAGNOSIS — Z95828 Presence of other vascular implants and grafts: Secondary | ICD-10-CM

## 2019-04-07 DIAGNOSIS — R5383 Other fatigue: Secondary | ICD-10-CM

## 2019-04-07 DIAGNOSIS — R609 Edema, unspecified: Secondary | ICD-10-CM | POA: Diagnosis not present

## 2019-04-07 DIAGNOSIS — C7982 Secondary malignant neoplasm of genital organs: Secondary | ICD-10-CM | POA: Diagnosis not present

## 2019-04-07 DIAGNOSIS — R634 Abnormal weight loss: Secondary | ICD-10-CM | POA: Diagnosis not present

## 2019-04-07 DIAGNOSIS — E876 Hypokalemia: Secondary | ICD-10-CM

## 2019-04-07 DIAGNOSIS — E7800 Pure hypercholesterolemia, unspecified: Secondary | ICD-10-CM

## 2019-04-07 DIAGNOSIS — Z8042 Family history of malignant neoplasm of prostate: Secondary | ICD-10-CM | POA: Diagnosis not present

## 2019-04-07 DIAGNOSIS — T451X5A Adverse effect of antineoplastic and immunosuppressive drugs, initial encounter: Secondary | ICD-10-CM | POA: Diagnosis not present

## 2019-04-07 DIAGNOSIS — Z5111 Encounter for antineoplastic chemotherapy: Secondary | ICD-10-CM

## 2019-04-07 DIAGNOSIS — D6481 Anemia due to antineoplastic chemotherapy: Secondary | ICD-10-CM

## 2019-04-07 DIAGNOSIS — Z20822 Contact with and (suspected) exposure to covid-19: Secondary | ICD-10-CM

## 2019-04-07 DIAGNOSIS — Z87891 Personal history of nicotine dependence: Secondary | ICD-10-CM | POA: Diagnosis not present

## 2019-04-07 DIAGNOSIS — K5900 Constipation, unspecified: Secondary | ICD-10-CM | POA: Diagnosis not present

## 2019-04-07 DIAGNOSIS — Z79899 Other long term (current) drug therapy: Secondary | ICD-10-CM

## 2019-04-07 LAB — COMPREHENSIVE METABOLIC PANEL
ALT: 14 U/L (ref 0–44)
AST: 25 U/L (ref 15–41)
Albumin: 3.9 g/dL (ref 3.5–5.0)
Alkaline Phosphatase: 39 U/L (ref 38–126)
Anion gap: 10 (ref 5–15)
BUN: 8 mg/dL (ref 8–23)
CO2: 27 mmol/L (ref 22–32)
Calcium: 9.5 mg/dL (ref 8.9–10.3)
Chloride: 103 mmol/L (ref 98–111)
Creatinine, Ser: 0.93 mg/dL (ref 0.44–1.00)
GFR calc Af Amer: >60 mL/min (ref >60)
GFR calc non Af Amer: >60 mL/min (ref >60)
Glucose, Bld: 134 mg/dL — ABNORMAL HIGH (ref 70–99)
Potassium: 3.5 mmol/L (ref 3.5–5.1)
Sodium: 140 mmol/L (ref 135–145)
Total Bilirubin: 0.5 mg/dL (ref 0.3–1.2)
Total Protein: 6.8 g/dL (ref 6.5–8.1)

## 2019-04-07 LAB — CBC WITH DIFFERENTIAL/PLATELET
Abs Immature Granulocytes: 0.04 10*3/uL (ref 0.00–0.07)
Basophils Absolute: 0 10*3/uL (ref 0.0–0.1)
Basophils Relative: 0 %
Eosinophils Absolute: 0.2 10*3/uL (ref 0.0–0.5)
Eosinophils Relative: 3 %
HCT: 33.1 % — ABNORMAL LOW (ref 36.0–46.0)
Hemoglobin: 10.8 g/dL — ABNORMAL LOW (ref 12.0–15.0)
Immature Granulocytes: 1 %
Lymphocytes Relative: 33 %
Lymphs Abs: 1.8 10*3/uL (ref 0.7–4.0)
MCH: 31.4 pg (ref 26.0–34.0)
MCHC: 32.6 g/dL (ref 30.0–36.0)
MCV: 96.2 fL (ref 80.0–100.0)
Monocytes Absolute: 0.5 10*3/uL (ref 0.1–1.0)
Monocytes Relative: 10 %
Neutro Abs: 2.8 10*3/uL (ref 1.7–7.7)
Neutrophils Relative %: 53 %
Platelets: 201 10*3/uL (ref 150–400)
RBC: 3.44 MIL/uL — ABNORMAL LOW (ref 3.87–5.11)
RDW: 17.7 % — ABNORMAL HIGH (ref 11.5–15.5)
WBC: 5.4 10*3/uL (ref 4.0–10.5)
nRBC: 0 % (ref 0.0–0.2)

## 2019-04-07 MED ORDER — LIDOCAINE-PRILOCAINE 2.5-2.5 % EX CREA: TOPICAL_CREAM | CUTANEOUS | 4 refills | Status: DC

## 2019-04-07 MED ORDER — PROCHLORPERAZINE MALEATE 10 MG PO TABS: 10.0000 mg | ORAL_TABLET | Freq: Four times a day (QID) | ORAL | 1 refills | Status: DC | PRN

## 2019-04-07 MED ORDER — SODIUM CHLORIDE 0.9% FLUSH
10.0000 mL | Freq: Once | INTRAVENOUS | Status: AC
  Administered 2019-04-07: 08:00:00 10 mL via INTRAVENOUS
  Filled 2019-04-07: qty 10

## 2019-04-07 MED ORDER — POTASSIUM CHLORIDE CRYS ER 20 MEQ PO TBCR: 20.0000 meq | EXTENDED_RELEASE_TABLET | Freq: Every day | ORAL | 0 refills | Status: DC

## 2019-04-07 MED ORDER — ONDANSETRON HCL 8 MG PO TABS: 8.0000 mg | ORAL_TABLET | Freq: Two times a day (BID) | ORAL | 1 refills | Status: DC | PRN

## 2019-04-07 MED ORDER — HEPARIN SOD (PORK) LOCK FLUSH 100 UNIT/ML IV SOLN
500.0000 [IU] | Freq: Once | INTRAVENOUS | Status: AC
  Administered 2019-04-07: 500 [IU] via INTRAVENOUS
  Filled 2019-04-07: qty 5

## 2019-04-07 NOTE — Progress Notes (Signed)
Per Almyra Free CMA per Dr. Tasia Catchings, no treatment at this time. Pt stable at discharge.

## 2019-04-07 NOTE — Progress Notes (Signed)
Hematology/Oncology progress note [facility] Telephone:([phone] [phone]   Patient Care Team: Kirk Ruths, MD as PCP - General (Internal Medicine) Clent Jacks, RN as Registered Nurse  REFERRING PROVIDER: Dr.Secord CHIEF COMPLAINTS/REASON FOR VISIT:  Evaluation of endometrial cancer  HISTORY OF PRESENTING ILLNESS:  Jacqueline Yoder is a  73 y.o.  female with PMH listed below who was referred to me for evaluation of endometrial cancer.   Patient initially presented with complaints of postmenopausal bleeding on 08/16/2018.  History of was menopausal vaginal bleeding in 2016 which resulted in cervical polypectomy.  Pathology 02/04/2015 showed cervical polyp, consistent with benign endometrial polyp.  [name] after polypectomy due to anxiety associated with pelvic exams.  pelvic exam on 08/16/2018 reviewed cervical abnormality and from enlarged uterus. Seen by Dr. Marcelline Mates on 10/29/2018.  Endometrial biopsy and a Pap smear was performed. 10/29/2018 Pap smear showed adenocarcinoma, favor endometrial origin. 10/29/2018 endometrial biopsy showed endometrioid carcinoma, FIGO grade 1.  10/29/2018- TA & TV Ultrasound revealed: Anteverted uterus measuring 8.7 x 5.6 x 6.4 cm without evidence of focal masses.  The endometrium measuring 24.1 mm (thickened) and heterogeneous.  Right and left ovaries not visualized.  No adnexal masses identified.  No free fluid in cul-de-sac.  Patient was seen by Dr. Theora Gianotti in clinic on 11/13/2018.  Cervical exam reveals 2 cm exophytic irregular mass consistent with malignancy.   11/19/2018 CT chest abdomen pelvis with contrast showed thickened endometrium with some irregularity compatible with the provided diagnosis of endometrial malignancy.  There is a mildly prominent left inguinal node 1.4 cm.  Patient was seen by Dr. Fransisca Connors on 11/20/2018 and left groin lymph node biopsy was recommended.  11/26/2018 patient  underwent left inguinal lymph node biopsy. Pathology showed metastatic adenocarcinoma consistent with colorectal origin.  CDX 2+.  Case was discussed on tumor board.  Recommend colonoscopy for further evaluation.  Patient reports significant weight loss 30 pounds over the last year.  Chronic vaginal spotting. Change of bowel habits the past few months.  More constipated.  Family history positive for brother who has colon cancer prostate cancer.  patient has underwent colonoscopy on 12/03/2018 which reviewed a nonobstructing large mass in the rectum.  Also chronic fistula.  Mass was not circumferential.  This was biopsied with a cold forceps for histology.  Pathology came back hyperplastic polyp negative for dysplasia and malignancy. [name], patient underwent flex sigmoidoscopy on 12/06/2018 with rebiopsy of the rectal mass. This time biopsy results came back positive for invasive colorectal adenocarcinoma, moderately differentiated. Immunotherapy for nearly mismatch repair protein (MMR ) was performed.  There is no loss of MMR expression.  low probability of MSI high.   # Seen by [name] for evaluation of resectability for rectal cancer. In addition, she also had a second opinion with [facility] where her endometrial biopsy pathology was changed to  adenocarcinoma, consistent with colorectal primary.   [name]. She has home health that has been assisting with ostomy care  Patient was also evaluated by [facility] oncology.  Recommendation is to proceed with TNT with concurrent chemoradiation followed by neoadjuvant chemotherapy followed by surgical resection. Patient prefers to have treatment done locally with [facility].   # Oncology Treatment:  02/03/2019- 03/19/2019  concurrent Xeloda and radiation.  Xeloda dose 843m /m2 BID - rounded to 16580mBID- on days of radiation.  INTERVAL HISTORY Jacqueline WHEELANDs a [age].o. female who has  above history reviewed by me today presents for evaluation prior to neoadjuvant chemotherapy FOLFOX for treatment of rectal cancer. Patient has finished concurrent chemoradiation. Patient reports feeling well. Chronic tired and fatigued. Appetite is well.  She has gained 3 pounds since 2 weeks ago. He had a Mediport placed last week. Had COVID testing done prior to the Mediport.   Review of Systems  Constitutional: Positive for fatigue. Negative for appetite change, chills, fever and unexpected weight change.  HENT:   Negative for hearing loss and voice change.   Eyes: Negative for eye problems.  Respiratory: Negative for chest tightness and cough.   Cardiovascular: Negative for chest pain.  Gastrointestinal: Negative for abdominal distention, abdominal pain, blood in stool, constipation, diarrhea and nausea.  Endocrine: Negative for hot flashes.  Genitourinary: Negative for difficulty urinating and frequency.   Musculoskeletal: Negative for arthralgias.  Skin: Negative for itching and rash.  Neurological: Negative for extremity weakness.  Hematological: Negative for adenopathy.  Psychiatric/Behavioral: Negative for confusion.    MEDICAL HISTORY:  Past Medical History:  Diagnosis Date   Arthritis    Cancer ([place])    rectal   GERD (gastroesophageal reflux disease)    Hypercholesteremia    Hypertension    Hypertension    Lower extremity edema    Urinary incontinence     SURGICAL HISTORY: Past Surgical History:  Procedure Laterality Date   CHOLECYSTECTOMY  1971   COLONOSCOPY WITH PROPOFOL N/A 12/03/2018   Procedure: COLONOSCOPY WITH PROPOFOL;  Surgeon: Lucilla Lame, MD;  Location: [facility] ENDOSCOPY;  Service: Endoscopy;  Laterality: N/A;   FLEXIBLE SIGMOIDOSCOPY N/A 12/06/2018   Procedure: FLEXIBLE SIGMOIDOSCOPY;  Surgeon: [name], MD;  Location: [facility] ENDOSCOPY;  Service: Endoscopy;  Laterality: N/A;   LAPAROSCOPIC COLOSTOMY  01/06/2019   PORTACATH PLACEMENT  N/A 04/03/2019   Procedure: INSERTION PORT-A-CATH;  Surgeon: Jules Husbands, MD;  Location: [facility] ORS;  Service: General;  Laterality: N/A;    SOCIAL HISTORY: Social History   Socioeconomic History   Marital status: Married    Spouse name: Not on file   Number of children: Not on file   Years of education: Not on file   Highest education level: Not on file  Occupational History   Not on file  Social Needs   Financial resource strain: Not on file   Food insecurity    Worry: Not on file    Inability: Not on file   Transportation needs    Medical: Not on file    Non-medical: Not on file  Tobacco Use   Smoking status: Former Smoker    Quit date: 12/02/1977    Years since quitting: 41.3   Smokeless tobacco: Never Used  Substance and Sexual Activity   Alcohol use: Never    Frequency: Never   Drug use: Never   Sexual activity: Not Currently    Birth control/protection: None  Lifestyle   Physical activity    Days per week: Not on file    Minutes per session: Not on file   Stress: Not on file  Relationships   Social connections    Talks on phone: Not on file    Gets together: Not on file    Attends religious service: Not on file    Active member of club or organization: Not on file    Attends meetings of clubs or organizations: Not on file    Relationship status: Not on file   Intimate partner violence    Fear of current or ex  partner: Not on file    Emotionally abused: Not on file    Physically abused: Not on file    Forced sexual activity: Not on file  Other Topics Concern   Not on file  Social History Narrative   Not on file    FAMILY HISTORY: Family History  Problem Relation Age of Onset   Colon cancer Brother    Prostate cancer Brother    Hypertension Mother    Stroke Mother    Kidney failure Father    Breast cancer Neg Hx    Ovarian cancer Neg Hx     ALLERGIES:  is allergic to sulfamethoxazole-trimethoprim.  MEDICATIONS:    Current Outpatient Medications  Medication Sig Dispense Refill   Cholecalciferol (VITAMIN D3) 2000 units capsule Take 2,000 Units by mouth daily.     diclofenac sodium (VOLTAREN) 1 % GEL Apply 2 g topically 4 (four) times daily as needed (joint pain).   11   fexofenadine (ALLEGRA) 180 MG tablet Take 180 mg by mouth daily.     fluticasone (FLONASE) 50 MCG/ACT nasal spray Place 1 spray into both nostrils 2 (two) times a day.      HYDROcodone-acetaminophen (NORCO/VICODIN) 5-325 MG tablet Take 1 tablet by mouth every 4 (four) hours as needed for moderate pain. 8 tablet 0   ketorolac (ACULAR) 0.4 % SOLN Place 1 drop into both eyes 2 (two) times daily.      lidocaine-prilocaine (EMLA) cream Apply 1 application topically as needed. 30 g 6   loperamide (IMODIUM) 2 MG capsule Take 1 capsule (2 mg total) by mouth See admin instructions. With onset of loose stool, take 61m followed by 266mevery 2 hours,  Maximum: 16 mg/day 120 capsule 1   Multiple Vitamins-Minerals (ONE-A-DAY WOMENS 50 PLUS PO) Take 1 tablet by mouth daily.      nystatin (MYCOSTATIN/NYSTOP) powder Apply topically 4 (four) times daily. 30 g 0   ondansetron (ZOFRAN) 8 MG tablet Take 1 tablet (8 mg total) by mouth every 8 (eight) hours as needed for nausea, vomiting or refractory nausea / vomiting. 90 tablet 1   pantoprazole (PROTONIX) 40 MG tablet Take 1 tablet (40 mg total) by mouth as needed. (Patient taking differently: Take 40 mg by mouth daily as needed (heartburn). ) 30 tablet 0   potassium chloride SA (K-DUR) 20 MEQ tablet Take 1 tablet (20 mEq total) by mouth daily. 14 tablet 0   prednisoLONE acetate (PRED FORTE) 1 % ophthalmic suspension Place 1 drop into both eyes 2 (two) times a day.   1   simvastatin (ZOCOR) 40 MG tablet Take 40 mg by mouth at bedtime.      triamterene-hydrochlorothiazide (DYAZIDE) 37.5-25 MG capsule Take 1 capsule by mouth daily.      Lutein 40 MG CAPS Take 40 mg by mouth daily.       Lutein-Bilberry (BILBERRY PLUS LUTEIN) 02-999 MCG-MG CAPS Take 1 capsule by mouth daily.      No current facility-administered medications for this visit.      PHYSICAL EXAMINATION: ECOG PERFORMANCE STATUS: 1 - Symptomatic but completely ambulatory Vitals:   04/07/19 0906  BP: 130/79  Pulse: 77  Resp: 18  Temp: (!) 96.9 F (36.1 C)   Filed Weights   04/07/19 0906  Weight: 214 lb 3.2 oz (97.2 kg)    Physical Exam Constitutional:      General: She is not in acute distress. HENT:     Head: Normocephalic and atraumatic.  Eyes:  General: No scleral icterus.    Pupils: Pupils are equal, round, and reactive to light.  Neck:     Musculoskeletal: Normal range of motion and neck supple.  Cardiovascular:     Rate and Rhythm: Normal rate and regular rhythm.     Heart sounds: Normal heart sounds.  Pulmonary:     Effort: Pulmonary effort is normal. No respiratory distress.     Breath sounds: No wheezing.  Abdominal:     General: Bowel sounds are normal. There is no distension.     Palpations: Abdomen is soft. There is no mass.     Tenderness: There is no abdominal tenderness.     Comments: + colostomy bag with small amount of yellow color loose stool.   Musculoskeletal: Normal range of motion.        General: No deformity.  Skin:    General: Skin is warm and dry.     Findings: No erythema or rash.  Neurological:     Mental Status: She is alert and oriented to person, place, and time.     Cranial Nerves: No cranial nerve deficit.     Coordination: Coordination normal.  Psychiatric:        Behavior: Behavior normal.        Thought Content: Thought content normal.     RADIOGRAPHIC STUDIES: I have personally reviewed the radiological images as listed and agreed with the findings in the report.  CMP Latest Ref Rng & Units 04/07/2019  Glucose 70 - 99 mg/dL 134(H)  BUN 8 - 23 mg/dL 8  Creatinine 0.44 - 1.00 mg/dL 0.93  Sodium 135 - 145 mmol/L 140  Potassium 3.5 - 5.1  mmol/L 3.5  Chloride 98 - 111 mmol/L 103  CO2 22 - 32 mmol/L 27  Calcium 8.9 - 10.3 mg/dL 9.5  Total Protein 6.5 - 8.1 g/dL 6.8  Total Bilirubin 0.3 - 1.2 mg/dL 0.5  Alkaline Phos 38 - 126 U/L 39  AST 15 - 41 U/L 25  ALT 0 - 44 U/L 14   CBC Latest Ref Rng & Units 04/07/2019  WBC 4.0 - 10.5 K/uL 5.4  Hemoglobin 12.0 - 15.0 g/dL 10.8(L)  Hematocrit 36.0 - 46.0 % 33.1(L)  Platelets 150 - 400 K/uL 201    LABORATORY DATA:  I have reviewed the data as listed Lab Results  Component Value Date   WBC 5.4 04/07/2019   HGB 10.8 (L) 04/07/2019   HCT 33.1 (L) 04/07/2019   MCV 96.2 04/07/2019   PLT 201 04/07/2019   Recent Labs    02/07/19 1153 03/05/19 1013 03/13/19 1052 04/07/19 0800  NA 139 139 135 140  K 3.7 3.0* 3.0* 3.5  CL 104 100 96* 103  CO2 '26 26 28 27  ' GLUCOSE 97 105* 104* 134*  BUN 11 9 7* 8  CREATININE 0.88 0.92 0.93 0.93  CALCIUM 9.3 9.1 9.1 9.5  GFRNONAA >60 >60 >60 >60  GFRAA >60 >60 >60 >60  PROT 6.8 6.6  --  6.8  ALBUMIN 3.9 3.9  --  3.9  AST 25 28  --  25  ALT 17 18  --  14  ALKPHOS 34* 35*  --  39  BILITOT 0.6 0.6  --  0.5   Iron/TIBC/Ferritin/ %Sat No results found for: IRON, TIBC, FERRITIN, IRONPCTSAT   RADIOGRAPHIC STUDIES: I have personally reviewed the radiological images as listed and agreed with the findings in the report. #11/19/2018 CT chest abdomen pelvis chest 1. Thickened endometrium with some  irregularity compatible with the provided diagnosis of endometrial malignancy. There is a mildly prominent left inguinal lymph node at 1.4 cm in short axis which may reflect nodal spread. 2. Bilateral cystic renal lesions. We do not have precontrast images in order to be able to assigned Bosniak classifications. No significant degree of de-enhancement to favor solid mass. 3. Aortic Atherosclerosis (ICD10-I70.0). Coronary atherosclerosis. Sigmoid colon diverticulosis, with several areas of mild colon wall thickening probably from contraction. Correlation  with the patient's colon cancer screening history is recommended. If screening is not up-to-date, appropriate screening should be considered. 4. Lumbar impingement at L3-4 and L4-5.5. Esophageal reflux versus dysmotility.  ASSESSMENT & PLAN:  1. Rectal cancer ([place])   2. Port-A-Cath in place   3. Encounter for antineoplastic chemotherapy   4. Anemia due to antineoplastic chemotherapy   Cancer Staging Rectal cancer [facility]) Staging form: Colon and Rectum, AJCC 8th Edition - Clinical stage from 01/23/2019: Stage IIIC (cT4b, cN1a, cM0) - Signed by Earlie Server, MD on 01/23/2019 #Rectal cancer Labs are reviewed and discussed with patient. Counts acceptable to proceed with FOLFOX- will start with Oxaliplatin reduced to 2m/m2, omit 5-FU bolus given her age/PS and anemia.  Patient will need to repeat COVID testing and will receive chemotherapy on 04/09/2019.  #Hypokalemia, potassium 3.5 today.  Continue take potassium chloride 20 mEq daily. #Anemia secondary to chemotherapy, hemoglobin 10.8.  Continue to monitor. #Noticed family history of colon cancer, prostate cancer in brother, she has personal history of endometrial cancer and pending further work-up for possible colorectal malignancy.  Discussed about genetic testing.   Family is still thinking about genetic testing.    We spent sufficient time to discuss many aspect of care, questions were answered to patient's satisfaction. Total face to face encounter time for this patient visit was 40 min. >50% of the time was  spent in counseling and coordination of care.   Follow up on 04/16/2019 for lab md assessment chemotherapy tolerability.  I called patient's daughter LDonald Proseand updated her above plan. Orders Placed This Encounter  Procedures   CEA    Standing Status:   Future    Standing Expiration Date:   04/06/2020    ZEarlie Server MD, PhD

## 2019-04-07 NOTE — Progress Notes (Signed)
Patient here for follow up. No concerns voiced.  °

## 2019-04-08 ENCOUNTER — Telehealth: Payer: Self-pay | Admitting: Oncology

## 2019-04-08 NOTE — Telephone Encounter (Signed)
Called pt to do pre-appt screen and confirm appt details but pt states that she was told by Dr. Tasia Catchings on 6/22 that her appt may be rescheduled pending COVID-19 test results. SHe also was inquiring on medication and dosing information for her Potassium prescription that she just picked up today. Told pt that I would send an InBasket msg to Dr. Tasia Catchings and her team. Pt is requesting a callback at [phone].

## 2019-04-08 NOTE — Anesthesia Postprocedure Evaluation (Signed)
Anesthesia Post Note  Patient: Jacqueline Yoder  Procedure(s) Performed: INSERTION PORT-A-CATH (N/A )  Patient location during evaluation: PACU Anesthesia Type: General Level of consciousness: awake and alert Pain management: pain level controlled Vital Signs Assessment: post-procedure vital signs reviewed and stable Respiratory status: spontaneous breathing, nonlabored ventilation, respiratory function stable and patient connected to nasal cannula oxygen Cardiovascular status: blood pressure returned to baseline and stable Postop Assessment: no apparent nausea or vomiting Anesthetic complications: no     Last Vitals:  Vitals:   04/03/19 1147 04/03/19 1233  BP: (!) 138/48 (!) 114/54  Pulse: 72 63  Resp: 16 16  Temp:  36.6 C  SpO2: 99% 100%    Last Pain:  Vitals:   04/04/19 0821  TempSrc:   PainSc: 3                  Amalee Olsen Harvie Heck

## 2019-04-09 ENCOUNTER — Other Ambulatory Visit: Payer: Self-pay

## 2019-04-09 ENCOUNTER — Inpatient Hospital Stay: Payer: Medicare Other

## 2019-04-09 ENCOUNTER — Inpatient Hospital Stay: Payer: Medicare Other | Admitting: Oncology

## 2019-04-09 VITALS — BP 130/79 | HR 80 | Temp 97.0°F | Resp 18

## 2019-04-09 DIAGNOSIS — C20 Malignant neoplasm of rectum: Secondary | ICD-10-CM | POA: Diagnosis not present

## 2019-04-09 DIAGNOSIS — Z5111 Encounter for antineoplastic chemotherapy: Secondary | ICD-10-CM

## 2019-04-09 DIAGNOSIS — Z95828 Presence of other vascular implants and grafts: Secondary | ICD-10-CM

## 2019-04-09 LAB — COMPREHENSIVE METABOLIC PANEL
ALT: 12 U/L (ref 0–44)
AST: 24 U/L (ref 15–41)
Albumin: 3.9 g/dL (ref 3.5–5.0)
Alkaline Phosphatase: 38 U/L (ref 38–126)
Anion gap: 10 (ref 5–15)
BUN: 7 mg/dL — ABNORMAL LOW (ref 8–23)
CO2: 26 mmol/L (ref 22–32)
Calcium: 9.4 mg/dL (ref 8.9–10.3)
Chloride: 103 mmol/L (ref 98–111)
Creatinine, Ser: 1 mg/dL (ref 0.44–1.00)
GFR calc Af Amer: >60 mL/min (ref >60)
GFR calc non Af Amer: 56 mL/min — ABNORMAL LOW (ref >60)
Glucose, Bld: 124 mg/dL — ABNORMAL HIGH (ref 70–99)
Potassium: 3.6 mmol/L (ref 3.5–5.1)
Sodium: 139 mmol/L (ref 135–145)
Total Bilirubin: 0.5 mg/dL (ref 0.3–1.2)
Total Protein: 7 g/dL (ref 6.5–8.1)

## 2019-04-09 LAB — CBC WITH DIFFERENTIAL/PLATELET
Abs Immature Granulocytes: 0.03 10*3/uL (ref 0.00–0.07)
Basophils Absolute: 0 10*3/uL (ref 0.0–0.1)
Basophils Relative: 0 %
Eosinophils Absolute: 0.2 10*3/uL (ref 0.0–0.5)
Eosinophils Relative: 3 %
HCT: 33.6 % — ABNORMAL LOW (ref 36.0–46.0)
Hemoglobin: 10.9 g/dL — ABNORMAL LOW (ref 12.0–15.0)
Immature Granulocytes: 1 %
Lymphocytes Relative: 31 %
Lymphs Abs: 1.7 10*3/uL (ref 0.7–4.0)
MCH: 31.4 pg (ref 26.0–34.0)
MCHC: 32.4 g/dL (ref 30.0–36.0)
MCV: 96.8 fL (ref 80.0–100.0)
Monocytes Absolute: 0.5 10*3/uL (ref 0.1–1.0)
Monocytes Relative: 9 %
Neutro Abs: 3 10*3/uL (ref 1.7–7.7)
Neutrophils Relative %: 56 %
Platelets: 209 10*3/uL (ref 150–400)
RBC: 3.47 MIL/uL — ABNORMAL LOW (ref 3.87–5.11)
RDW: 17.4 % — ABNORMAL HIGH (ref 11.5–15.5)
WBC: 5.4 10*3/uL (ref 4.0–10.5)
nRBC: 0 % (ref 0.0–0.2)

## 2019-04-09 MED ORDER — SODIUM CHLORIDE 0.9 % IV SOLN
2370.0000 mg/m2 | INTRAVENOUS | Status: DC
  Administered 2019-04-09: 12:00:00 5000 mg via INTRAVENOUS
  Filled 2019-04-09: qty 100

## 2019-04-09 MED ORDER — LEUCOVORIN CALCIUM INJECTION 350 MG
850.0000 mg | Freq: Once | INTRAVENOUS | Status: AC
  Administered 2019-04-09: 10:00:00 850 mg via INTRAVENOUS
  Filled 2019-04-09: qty 25

## 2019-04-09 MED ORDER — PALONOSETRON HCL INJECTION 0.25 MG/5ML
0.2500 mg | Freq: Once | INTRAVENOUS | Status: AC
  Administered 2019-04-09: 0.25 mg via INTRAVENOUS
  Filled 2019-04-09: qty 5

## 2019-04-09 MED ORDER — DEXAMETHASONE SODIUM PHOSPHATE 10 MG/ML IJ SOLN
10.0000 mg | Freq: Once | INTRAMUSCULAR | Status: AC
  Administered 2019-04-09: 10 mg via INTRAVENOUS
  Filled 2019-04-09: qty 1

## 2019-04-09 MED ORDER — OXALIPLATIN CHEMO INJECTION 100 MG/20ML
71.0000 mg/m2 | Freq: Once | INTRAVENOUS | Status: AC
  Administered 2019-04-09: 150 mg via INTRAVENOUS
  Filled 2019-04-09: qty 20

## 2019-04-09 MED ORDER — DEXTROSE 5 % IV SOLN
Freq: Once | INTRAVENOUS | Status: AC
  Administered 2019-04-09: 09:00:00 via INTRAVENOUS
  Filled 2019-04-09: qty 250

## 2019-04-09 MED ORDER — SODIUM CHLORIDE 0.9% FLUSH
10.0000 mL | Freq: Once | INTRAVENOUS | Status: AC
  Administered 2019-04-09: 08:00:00 10 mL via INTRAVENOUS
  Filled 2019-04-09: qty 10

## 2019-04-09 NOTE — Progress Notes (Signed)
Pt tolerated infusion well. Pt stable at discharge, pt denies any questions or concerns at this time.

## 2019-04-09 NOTE — Addendum Note (Signed)
Addended by: Earlie Server on: 04/09/2019 08:44 AM   Modules accepted: Orders

## 2019-04-10 ENCOUNTER — Other Ambulatory Visit: Payer: Medicare Other

## 2019-04-10 ENCOUNTER — Ambulatory Visit: Payer: Medicare Other | Admitting: Oncology

## 2019-04-10 ENCOUNTER — Ambulatory Visit: Payer: Medicare Other

## 2019-04-10 LAB — CEA: CEA: 109 ng/mL — ABNORMAL HIGH (ref 0.0–4.7)

## 2019-04-11 ENCOUNTER — Other Ambulatory Visit: Payer: Self-pay

## 2019-04-11 ENCOUNTER — Inpatient Hospital Stay: Payer: Medicare Other

## 2019-04-11 VITALS — BP 114/71 | HR 88 | Temp 96.5°F | Resp 18

## 2019-04-11 DIAGNOSIS — C20 Malignant neoplasm of rectum: Secondary | ICD-10-CM

## 2019-04-11 MED ORDER — HEPARIN SOD (PORK) LOCK FLUSH 100 UNIT/ML IV SOLN
500.0000 [IU] | Freq: Once | INTRAVENOUS | Status: AC | PRN
  Administered 2019-04-11: 500 [IU]
  Filled 2019-04-11: qty 5

## 2019-04-11 MED ORDER — SODIUM CHLORIDE 0.9% FLUSH
10.0000 mL | INTRAVENOUS | Status: DC | PRN
  Administered 2019-04-11: 13:00:00 10 mL
  Filled 2019-04-11: qty 10

## 2019-04-12 LAB — NOVEL CORONAVIRUS, NAA: SARS-CoV-2, NAA: NOT DETECTED

## 2019-04-15 ENCOUNTER — Telehealth: Payer: Self-pay | Admitting: Hematology

## 2019-04-15 NOTE — Telephone Encounter (Signed)
Pt called and negative results was given to pt

## 2019-04-16 ENCOUNTER — Inpatient Hospital Stay (HOSPITAL_BASED_OUTPATIENT_CLINIC_OR_DEPARTMENT_OTHER): Payer: Medicare Other | Admitting: Oncology

## 2019-04-16 ENCOUNTER — Inpatient Hospital Stay: Payer: Medicare Other | Attending: Oncology

## 2019-04-16 ENCOUNTER — Other Ambulatory Visit: Payer: Self-pay

## 2019-04-16 ENCOUNTER — Encounter: Payer: Self-pay | Admitting: Oncology

## 2019-04-16 VITALS — BP 130/65 | HR 92 | Temp 98.6°F | Resp 18 | Wt 206.9 lb

## 2019-04-16 DIAGNOSIS — T451X5A Adverse effect of antineoplastic and immunosuppressive drugs, initial encounter: Secondary | ICD-10-CM | POA: Insufficient documentation

## 2019-04-16 DIAGNOSIS — R197 Diarrhea, unspecified: Secondary | ICD-10-CM

## 2019-04-16 DIAGNOSIS — K219 Gastro-esophageal reflux disease without esophagitis: Secondary | ICD-10-CM | POA: Diagnosis not present

## 2019-04-16 DIAGNOSIS — E7800 Pure hypercholesterolemia, unspecified: Secondary | ICD-10-CM | POA: Diagnosis not present

## 2019-04-16 DIAGNOSIS — Z87891 Personal history of nicotine dependence: Secondary | ICD-10-CM | POA: Insufficient documentation

## 2019-04-16 DIAGNOSIS — R7989 Other specified abnormal findings of blood chemistry: Secondary | ICD-10-CM | POA: Diagnosis not present

## 2019-04-16 DIAGNOSIS — R609 Edema, unspecified: Secondary | ICD-10-CM | POA: Insufficient documentation

## 2019-04-16 DIAGNOSIS — C20 Malignant neoplasm of rectum: Secondary | ICD-10-CM

## 2019-04-16 DIAGNOSIS — Z933 Colostomy status: Secondary | ICD-10-CM

## 2019-04-16 DIAGNOSIS — E860 Dehydration: Secondary | ICD-10-CM

## 2019-04-16 DIAGNOSIS — C7982 Secondary malignant neoplasm of genital organs: Secondary | ICD-10-CM | POA: Diagnosis not present

## 2019-04-16 DIAGNOSIS — I10 Essential (primary) hypertension: Secondary | ICD-10-CM | POA: Diagnosis not present

## 2019-04-16 DIAGNOSIS — K5900 Constipation, unspecified: Secondary | ICD-10-CM | POA: Insufficient documentation

## 2019-04-16 DIAGNOSIS — Z79899 Other long term (current) drug therapy: Secondary | ICD-10-CM | POA: Insufficient documentation

## 2019-04-16 DIAGNOSIS — M129 Arthropathy, unspecified: Secondary | ICD-10-CM | POA: Diagnosis not present

## 2019-04-16 DIAGNOSIS — R634 Abnormal weight loss: Secondary | ICD-10-CM

## 2019-04-16 DIAGNOSIS — D6481 Anemia due to antineoplastic chemotherapy: Secondary | ICD-10-CM | POA: Insufficient documentation

## 2019-04-16 DIAGNOSIS — I700 Atherosclerosis of aorta: Secondary | ICD-10-CM | POA: Insufficient documentation

## 2019-04-16 DIAGNOSIS — E876 Hypokalemia: Secondary | ICD-10-CM | POA: Diagnosis not present

## 2019-04-16 DIAGNOSIS — K521 Toxic gastroenteritis and colitis: Secondary | ICD-10-CM | POA: Insufficient documentation

## 2019-04-16 DIAGNOSIS — Z5111 Encounter for antineoplastic chemotherapy: Secondary | ICD-10-CM | POA: Insufficient documentation

## 2019-04-16 LAB — COMPREHENSIVE METABOLIC PANEL
ALT: 15 U/L (ref 0–44)
AST: 24 U/L (ref 15–41)
Albumin: 3.9 g/dL (ref 3.5–5.0)
Alkaline Phosphatase: 35 U/L — ABNORMAL LOW (ref 38–126)
Anion gap: 11 (ref 5–15)
BUN: 9 mg/dL (ref 8–23)
CO2: 28 mmol/L (ref 22–32)
Calcium: 9.4 mg/dL (ref 8.9–10.3)
Chloride: 102 mmol/L (ref 98–111)
Creatinine, Ser: 1.17 mg/dL — ABNORMAL HIGH (ref 0.44–1.00)
GFR calc Af Amer: 54 mL/min — ABNORMAL LOW (ref >60)
GFR calc non Af Amer: 47 mL/min — ABNORMAL LOW (ref >60)
Glucose, Bld: 121 mg/dL — ABNORMAL HIGH (ref 70–99)
Potassium: 3.6 mmol/L (ref 3.5–5.1)
Sodium: 141 mmol/L (ref 135–145)
Total Bilirubin: 0.4 mg/dL (ref 0.3–1.2)
Total Protein: 7.2 g/dL (ref 6.5–8.1)

## 2019-04-16 LAB — CBC WITH DIFFERENTIAL/PLATELET
Abs Immature Granulocytes: 0.06 10*3/uL (ref 0.00–0.07)
Basophils Absolute: 0 10*3/uL (ref 0.0–0.1)
Basophils Relative: 1 %
Eosinophils Absolute: 0.1 10*3/uL (ref 0.0–0.5)
Eosinophils Relative: 3 %
HCT: 34.4 % — ABNORMAL LOW (ref 36.0–46.0)
Hemoglobin: 10.8 g/dL — ABNORMAL LOW (ref 12.0–15.0)
Immature Granulocytes: 2 %
Lymphocytes Relative: 25 %
Lymphs Abs: 1 10*3/uL (ref 0.7–4.0)
MCH: 31.2 pg (ref 26.0–34.0)
MCHC: 31.4 g/dL (ref 30.0–36.0)
MCV: 99.4 fL (ref 80.0–100.0)
Monocytes Absolute: 0.4 10*3/uL (ref 0.1–1.0)
Monocytes Relative: 9 %
Neutro Abs: 2.5 10*3/uL (ref 1.7–7.7)
Neutrophils Relative %: 60 %
Platelets: 167 10*3/uL (ref 150–400)
RBC: 3.46 MIL/uL — ABNORMAL LOW (ref 3.87–5.11)
RDW: 16.5 % — ABNORMAL HIGH (ref 11.5–15.5)
WBC: 4 10*3/uL (ref 4.0–10.5)
nRBC: 0.5 % — ABNORMAL HIGH (ref 0.0–0.2)

## 2019-04-16 NOTE — Progress Notes (Signed)
Patient here for follow up. Pt states she has been having some vaginal bleeding. Pt states she has some loose stool in colostomy bag and nausea which is controlled by antiemetics. No other effects from chemo last week.

## 2019-04-16 NOTE — Progress Notes (Signed)
Hematology/Oncology progress note [facility] Telephone:([phone] [phone]   Patient Care Team: Kirk Ruths, MD as PCP - General (Internal Medicine) Clent Jacks, RN as Registered Nurse  REFERRING PROVIDER: Dr.Secord CHIEF COMPLAINTS/REASON FOR VISIT:  Evaluation of endometrial cancer  HISTORY OF PRESENTING ILLNESS:  Jacqueline Yoder is a  73 y.o.  female with PMH listed below who was referred to me for evaluation of endometrial cancer.   Patient initially presented with complaints of postmenopausal bleeding on 08/16/2018.  History of was menopausal vaginal bleeding in 2016 which resulted in cervical polypectomy.  Pathology 02/04/2015 showed cervical polyp, consistent with benign endometrial polyp.  [name] after polypectomy due to anxiety associated with pelvic exams.  pelvic exam on 08/16/2018 reviewed cervical abnormality and from enlarged uterus. Seen by Dr. Marcelline Mates on 10/29/2018.  Endometrial biopsy and a Pap smear was performed. 10/29/2018 Pap smear showed adenocarcinoma, favor endometrial origin. 10/29/2018 endometrial biopsy showed endometrioid carcinoma, FIGO grade 1.  10/29/2018- TA & TV Ultrasound revealed: Anteverted uterus measuring 8.7 x 5.6 x 6.4 cm without evidence of focal masses.  The endometrium measuring 24.1 mm (thickened) and heterogeneous.  Right and left ovaries not visualized.  No adnexal masses identified.  No free fluid in cul-de-sac.  Patient was seen by Dr. Theora Gianotti in clinic on 11/13/2018.  Cervical exam reveals 2 cm exophytic irregular mass consistent with malignancy.   11/19/2018 CT chest abdomen pelvis with contrast showed thickened endometrium with some irregularity compatible with the provided diagnosis of endometrial malignancy.  There is a mildly prominent left inguinal node 1.4 cm.  Patient was seen by Dr. Fransisca Connors on 11/20/2018 and left groin lymph node biopsy was recommended.  11/26/2018 patient  underwent left inguinal lymph node biopsy. Pathology showed metastatic adenocarcinoma consistent with colorectal origin.  CDX 2+.  Case was discussed on tumor board.  Recommend colonoscopy for further evaluation.  Patient reports significant weight loss 30 pounds over the last year.  Chronic vaginal spotting. Change of bowel habits the past few months.  More constipated.  Family history positive for brother who has colon cancer prostate cancer.  patient has underwent colonoscopy on 12/03/2018 which reviewed a nonobstructing large mass in the rectum.  Also chronic fistula.  Mass was not circumferential.  This was biopsied with a cold forceps for histology.  Pathology came back hyperplastic polyp negative for dysplasia and malignancy. [name], patient underwent flex sigmoidoscopy on 12/06/2018 with rebiopsy of the rectal mass. This time biopsy results came back positive for invasive colorectal adenocarcinoma, moderately differentiated. Immunotherapy for nearly mismatch repair protein (MMR ) was performed.  There is no loss of MMR expression.  low probability of MSI high.   # Seen by [name] for evaluation of resectability for rectal cancer. In addition, she also had a second opinion with [facility] where her endometrial biopsy pathology was changed to  adenocarcinoma, consistent with colorectal primary.   [name]. She has home health that has been assisting with ostomy care  Patient was also evaluated by [facility] oncology.  Recommendation is to proceed with TNT with concurrent chemoradiation followed by neoadjuvant chemotherapy followed by surgical resection. Patient prefers to have treatment done locally with [facility].   # Oncology Treatment:  02/03/2019- 03/19/2019  concurrent Xeloda and radiation.  Xeloda dose 882m /m2 BID - rounded to 16559mBID- on days of radiation. 04/09/2019, started on FOLFOX INTERVAL HISTORY Jacqueline LOMELIis a 73  y.o. female who has above history reviewed by me today presents for assessment of tolerability of neoadjuvant chemotherapy FOLFOX for treatment of rectal cancer. Status post cycle 1 FOLFOX. Patient reports that she had " a touch of diarrhea".  Loose stools in colostomy bag. She uses Imodium as instructed.  Diarrhea improved. Also had some nausea which was relieved by antiemetics at home. Chronic fatigue unchanged. Appetite is fair, she has lost 8 pounds since last visit.   Review of Systems  Constitutional: Positive for fatigue. Negative for appetite change, chills, fever and unexpected weight change.  HENT:   Negative for hearing loss and voice change.   Eyes: Negative for eye problems.  Respiratory: Negative for chest tightness and cough.   Cardiovascular: Negative for chest pain.  Gastrointestinal: Positive for diarrhea. Negative for abdominal distention, abdominal pain, blood in stool, constipation and nausea.  Endocrine: Negative for hot flashes.  Genitourinary: Negative for difficulty urinating and frequency.   Musculoskeletal: Negative for arthralgias.  Skin: Negative for itching and rash.  Neurological: Negative for extremity weakness.  Hematological: Negative for adenopathy.  Psychiatric/Behavioral: Negative for confusion.    MEDICAL HISTORY:  Past Medical History:  Diagnosis Date  . Arthritis   . Cancer ([place])    rectal  . GERD (gastroesophageal reflux disease)   . Hypercholesteremia   . Hypertension   . Hypertension   . Lower extremity edema   . Urinary incontinence     SURGICAL HISTORY: Past Surgical History:  Procedure Laterality Date  . CHOLECYSTECTOMY  1971  . COLONOSCOPY WITH PROPOFOL N/A 12/03/2018   Procedure: COLONOSCOPY WITH PROPOFOL;  Surgeon: Lucilla Lame, MD;  Location: [facility] ENDOSCOPY;  Service: Endoscopy;  Laterality: N/A;  . FLEXIBLE SIGMOIDOSCOPY N/A 12/06/2018   Procedure: FLEXIBLE SIGMOIDOSCOPY;  Surgeon: [name], MD;  Location: [facility]  ENDOSCOPY;  Service: Endoscopy;  Laterality: N/A;  . LAPAROSCOPIC COLOSTOMY  01/06/2019  . PORTACATH PLACEMENT N/A 04/03/2019   Procedure: INSERTION PORT-A-CATH;  Surgeon: Jules Husbands, MD;  Location: [facility] ORS;  Service: General;  Laterality: N/A;    SOCIAL HISTORY: Social History   Socioeconomic History  . Marital status: Married    Spouse name: Not on file  . Number of children: Not on file  . Years of education: Not on file  . Highest education level: Not on file  Occupational History  . Not on file  Social Needs  . Financial resource strain: Not on file  . Food insecurity    Worry: Not on file    Inability: Not on file  . Transportation needs    Medical: Not on file    Non-medical: Not on file  Tobacco Use  . Smoking status: Former Smoker    Quit date: 12/02/1977    Years since quitting: 41.3  . Smokeless tobacco: Never Used  Substance and Sexual Activity  . Alcohol use: Never    Frequency: Never  . Drug use: Never  . Sexual activity: Not Currently    Birth control/protection: None  Lifestyle  . Physical activity    Days per week: Not on file    Minutes per session: Not on file  . Stress: Not on file  Relationships  . Social Herbalist on phone: Not on file    Gets together: Not on file    Attends religious service: Not on file    Active member of club or organization: Not on file    Attends meetings of clubs or organizations: Not on file  Relationship status: Not on file  . Intimate partner violence    Fear of current or ex partner: Not on file    Emotionally abused: Not on file    Physically abused: Not on file    Forced sexual activity: Not on file  Other Topics Concern  . Not on file  Social History Narrative  . Not on file    FAMILY HISTORY: Family History  Problem Relation Age of Onset  . Colon cancer Brother   . Prostate cancer Brother   . Hypertension Mother   . Stroke Mother   . Kidney failure Father   . Breast cancer Neg Hx    . Ovarian cancer Neg Hx     ALLERGIES:  is allergic to sulfamethoxazole-trimethoprim.  MEDICATIONS:  Current Outpatient Medications  Medication Sig Dispense Refill  . Cholecalciferol (VITAMIN D3) 2000 units capsule Take 2,000 Units by mouth daily.    . diclofenac sodium (VOLTAREN) 1 % GEL Apply 2 g topically 4 (four) times daily as needed (joint pain).   11  . fexofenadine (ALLEGRA) 180 MG tablet Take 180 mg by mouth daily.    . fluticasone (FLONASE) 50 MCG/ACT nasal spray Place 1 spray into both nostrils 2 (two) times a day.     Marland Kitchen HYDROcodone-acetaminophen (NORCO/VICODIN) 5-325 MG tablet Take 1 tablet by mouth every 4 (four) hours as needed for moderate pain. 8 tablet 0  . ketorolac (ACULAR) 0.4 % SOLN Place 1 drop into both eyes 2 (two) times daily.     Marland Kitchen lidocaine-prilocaine (EMLA) cream Apply 1 application topically as needed. 30 g 6  . loperamide (IMODIUM) 2 MG capsule Take 1 capsule (2 mg total) by mouth See admin instructions. With onset of loose stool, take '4mg'$  followed by '2mg'$  every 2 hours,  Maximum: 16 mg/day 120 capsule 1  . Multiple Vitamins-Minerals (ONE-A-DAY WOMENS 50 PLUS PO) Take 1 tablet by mouth daily.     Marland Kitchen nystatin (MYCOSTATIN/NYSTOP) powder Apply topically 4 (four) times daily. 30 g 0  . ondansetron (ZOFRAN) 8 MG tablet Take 1 tablet (8 mg total) by mouth every 8 (eight) hours as needed for nausea, vomiting or refractory nausea / vomiting. 90 tablet 1  . pantoprazole (PROTONIX) 40 MG tablet Take 1 tablet (40 mg total) by mouth as needed. (Patient taking differently: Take 40 mg by mouth daily as needed (heartburn). ) 30 tablet 0  . potassium chloride SA (K-DUR) 20 MEQ tablet Take 1 tablet (20 mEq total) by mouth daily. 14 tablet 0  . prednisoLONE acetate (PRED FORTE) 1 % ophthalmic suspension Place 1 drop into both eyes 2 (two) times a day.   1  . prochlorperazine (COMPAZINE) 10 MG tablet Take 1 tablet (10 mg total) by mouth every 6 (six) hours as needed (Nausea or  vomiting). 30 tablet 1  . simvastatin (ZOCOR) 40 MG tablet Take 40 mg by mouth at bedtime.     . triamterene-hydrochlorothiazide (DYAZIDE) 37.5-25 MG capsule Take 1 capsule by mouth daily.     Marland Kitchen lidocaine-prilocaine (EMLA) cream Apply to affected area once (Patient not taking: Reported on 04/16/2019) 30 g 4  . Lutein 40 MG CAPS Take 40 mg by mouth daily.     . Lutein-Bilberry (BILBERRY PLUS LUTEIN) 02-999 MCG-MG CAPS Take 1 capsule by mouth daily.     . ondansetron (ZOFRAN) 8 MG tablet Take 1 tablet (8 mg total) by mouth 2 (two) times daily as needed for refractory nausea / vomiting. Start on day 3 after chemotherapy. (  Patient not taking: Reported on 04/16/2019) 30 tablet 1   No current facility-administered medications for this visit.      PHYSICAL EXAMINATION: ECOG PERFORMANCE STATUS: 1 - Symptomatic but completely ambulatory Vitals:   04/16/19 1304  BP: 130/65  Pulse: 92  Resp: 18  Temp: 98.6 F (37 C)   Filed Weights   04/16/19 1304  Weight: 206 lb 14.4 oz (93.8 kg)    Physical Exam Constitutional:      General: She is not in acute distress. HENT:     Head: Normocephalic and atraumatic.  Eyes:     General: No scleral icterus.    Pupils: Pupils are equal, round, and reactive to light.  Neck:     Musculoskeletal: Normal range of motion and neck supple.  Cardiovascular:     Rate and Rhythm: Normal rate and regular rhythm.     Heart sounds: Normal heart sounds.  Pulmonary:     Effort: Pulmonary effort is normal. No respiratory distress.     Breath sounds: No wheezing.  Abdominal:     General: Bowel sounds are normal. There is no distension.     Palpations: Abdomen is soft. There is no mass.     Tenderness: There is no abdominal tenderness.     Comments: + colostomy bag with small amount of yellow color loose stool.   Musculoskeletal: Normal range of motion.        General: No deformity.  Skin:    General: Skin is warm and dry.     Findings: No erythema or rash.   Neurological:     Mental Status: She is alert and oriented to person, place, and time.     Cranial Nerves: No cranial nerve deficit.     Coordination: Coordination normal.  Psychiatric:        Behavior: Behavior normal.        Thought Content: Thought content normal.     RADIOGRAPHIC STUDIES: I have personally reviewed the radiological images as listed and agreed with the findings in the report.  CMP Latest Ref Rng & Units 04/16/2019  Glucose 70 - 99 mg/dL 121(H)  BUN 8 - 23 mg/dL 9  Creatinine 0.44 - 1.00 mg/dL 1.17(H)  Sodium 135 - 145 mmol/L 141  Potassium 3.5 - 5.1 mmol/L 3.6  Chloride 98 - 111 mmol/L 102  CO2 22 - 32 mmol/L 28  Calcium 8.9 - 10.3 mg/dL 9.4  Total Protein 6.5 - 8.1 g/dL 7.2  Total Bilirubin 0.3 - 1.2 mg/dL 0.4  Alkaline Phos 38 - 126 U/L 35(L)  AST 15 - 41 U/L 24  ALT 0 - 44 U/L 15   CBC Latest Ref Rng & Units 04/16/2019  WBC 4.0 - 10.5 K/uL 4.0  Hemoglobin 12.0 - 15.0 g/dL 10.8(L)  Hematocrit 36.0 - 46.0 % 34.4(L)  Platelets 150 - 400 K/uL 167    LABORATORY DATA:  I have reviewed the data as listed Lab Results  Component Value Date   WBC 4.0 04/16/2019   HGB 10.8 (L) 04/16/2019   HCT 34.4 (L) 04/16/2019   MCV 99.4 04/16/2019   PLT 167 04/16/2019   Recent Labs    04/07/19 0800 04/09/19 0757 04/16/19 1246  NA 140 139 141  K 3.5 3.6 3.6  CL 103 103 102  CO2 '27 26 28  '$ GLUCOSE 134* 124* 121*  BUN 8 7* 9  CREATININE 0.93 1.00 1.17*  CALCIUM 9.5 9.4 9.4  GFRNONAA >60 56* 47*  GFRAA >60 >60 54*  PROT 6.8 7.0 7.2  ALBUMIN 3.9 3.9 3.9  AST '25 24 24  '$ ALT '14 12 15  '$ ALKPHOS 39 38 35*  BILITOT 0.5 0.5 0.4   Iron/TIBC/Ferritin/ %Sat No results found for: IRON, TIBC, FERRITIN, IRONPCTSAT   RADIOGRAPHIC STUDIES: I have personally reviewed the radiological images as listed and agreed with the findings in the report. #11/19/2018 CT chest abdomen pelvis chest 1. Thickened endometrium with some irregularity compatible with the provided diagnosis  of endometrial malignancy. There is a mildly prominent left inguinal lymph node at 1.4 cm in short axis which may reflect nodal spread. 2. Bilateral cystic renal lesions. We do not have precontrast images in order to be able to assigned Bosniak classifications. No significant degree of de-enhancement to favor solid mass. 3. Aortic Atherosclerosis (ICD10-I70.0). Coronary atherosclerosis. Sigmoid colon diverticulosis, with several areas of mild colon wall thickening probably from contraction. Correlation with the patient's colon cancer screening history is recommended. If screening is not up-to-date, appropriate screening should be considered. 4. Lumbar impingement at L3-4 and L4-5.5. Esophageal reflux versus dysmotility.  ASSESSMENT & PLAN:  1. Rectal cancer ([place])   2. Loss of weight   3. Diarrhea, unspecified type   4. Dehydration   Cancer Staging Rectal cancer [facility]) Staging form: Colon and Rectum, AJCC 8th Edition - Clinical stage from 01/23/2019: Stage IIIC (cT4b, cN1a, cM0) - Signed by Earlie Server, MD on 01/23/2019 #Rectal cancer Labs reviewed and discussed with patient. Counts are stable. She tolerates chemotherapy with mild to moderate difficulties.  #Diarrhea, continue using Imodium as instructed. #Slightly elevated creatinine, likely secondary to dehydration due to diarrhea. I offered patient to have IV fluid for hydration. Prefers to try oral hydration first. We will repeat kidney function in 1 week.  #Hypokalemia, potassium 3.6 today, stable, continue take potassium chloride 20 mEq daily. #Anemia secondary to chemotherapy, hemoglobin is 10.8 today, stable.  Continue to monitor  #Family history with half brother having's history of colon cancer prostate cancer.  Personal history of colorectal cancer. We discussed about genetic testing. Patient has not decided and she will update me if she is interested.  Patient would like to discuss with social worker about patient's assistance  program.  Refer to social service.   We spent sufficient time to discuss many aspect of care, questions were answered to patient's satisfaction. Total face to face encounter time for this patient visit was 25 min. >50% of the time was  spent in counseling and coordination of care.   No orders of the defined types were placed in this encounter.   Earlie Server, MD, PhD

## 2019-04-22 ENCOUNTER — Other Ambulatory Visit: Payer: Self-pay

## 2019-04-23 ENCOUNTER — Inpatient Hospital Stay: Payer: Medicare Other

## 2019-04-23 ENCOUNTER — Other Ambulatory Visit: Payer: Self-pay | Admitting: Oncology

## 2019-04-23 ENCOUNTER — Other Ambulatory Visit: Payer: Self-pay

## 2019-04-23 ENCOUNTER — Encounter: Payer: Self-pay | Admitting: Oncology

## 2019-04-23 ENCOUNTER — Inpatient Hospital Stay (HOSPITAL_BASED_OUTPATIENT_CLINIC_OR_DEPARTMENT_OTHER): Payer: Medicare Other | Admitting: Oncology

## 2019-04-23 VITALS — BP 95/59 | HR 82 | Temp 97.1°F | Ht 65.0 in | Wt 212.1 lb

## 2019-04-23 DIAGNOSIS — T451X5A Adverse effect of antineoplastic and immunosuppressive drugs, initial encounter: Secondary | ICD-10-CM | POA: Diagnosis not present

## 2019-04-23 DIAGNOSIS — I700 Atherosclerosis of aorta: Secondary | ICD-10-CM

## 2019-04-23 DIAGNOSIS — R609 Edema, unspecified: Secondary | ICD-10-CM

## 2019-04-23 DIAGNOSIS — D701 Agranulocytosis secondary to cancer chemotherapy: Secondary | ICD-10-CM

## 2019-04-23 DIAGNOSIS — C541 Malignant neoplasm of endometrium: Secondary | ICD-10-CM | POA: Diagnosis not present

## 2019-04-23 DIAGNOSIS — R634 Abnormal weight loss: Secondary | ICD-10-CM

## 2019-04-23 DIAGNOSIS — C20 Malignant neoplasm of rectum: Secondary | ICD-10-CM | POA: Diagnosis not present

## 2019-04-23 DIAGNOSIS — Z5111 Encounter for antineoplastic chemotherapy: Secondary | ICD-10-CM | POA: Diagnosis not present

## 2019-04-23 DIAGNOSIS — Z79899 Other long term (current) drug therapy: Secondary | ICD-10-CM

## 2019-04-23 DIAGNOSIS — M129 Arthropathy, unspecified: Secondary | ICD-10-CM

## 2019-04-23 DIAGNOSIS — K5900 Constipation, unspecified: Secondary | ICD-10-CM

## 2019-04-23 DIAGNOSIS — E7800 Pure hypercholesterolemia, unspecified: Secondary | ICD-10-CM

## 2019-04-23 DIAGNOSIS — D6481 Anemia due to antineoplastic chemotherapy: Secondary | ICD-10-CM

## 2019-04-23 DIAGNOSIS — Z87891 Personal history of nicotine dependence: Secondary | ICD-10-CM

## 2019-04-23 DIAGNOSIS — R197 Diarrhea, unspecified: Secondary | ICD-10-CM

## 2019-04-23 DIAGNOSIS — K219 Gastro-esophageal reflux disease without esophagitis: Secondary | ICD-10-CM

## 2019-04-23 DIAGNOSIS — E860 Dehydration: Secondary | ICD-10-CM

## 2019-04-23 DIAGNOSIS — I10 Essential (primary) hypertension: Secondary | ICD-10-CM

## 2019-04-23 DIAGNOSIS — R7989 Other specified abnormal findings of blood chemistry: Secondary | ICD-10-CM

## 2019-04-23 DIAGNOSIS — Z95828 Presence of other vascular implants and grafts: Secondary | ICD-10-CM

## 2019-04-23 DIAGNOSIS — E876 Hypokalemia: Secondary | ICD-10-CM

## 2019-04-23 LAB — COMPREHENSIVE METABOLIC PANEL
ALT: 11 U/L (ref 0–44)
AST: 23 U/L (ref 15–41)
Albumin: 3.9 g/dL (ref 3.5–5.0)
Alkaline Phosphatase: 38 U/L (ref 38–126)
Anion gap: 9 (ref 5–15)
BUN: 7 mg/dL — ABNORMAL LOW (ref 8–23)
CO2: 27 mmol/L (ref 22–32)
Calcium: 9.3 mg/dL (ref 8.9–10.3)
Chloride: 103 mmol/L (ref 98–111)
Creatinine, Ser: 0.93 mg/dL (ref 0.44–1.00)
GFR calc Af Amer: >60 mL/min (ref >60)
GFR calc non Af Amer: >60 mL/min (ref >60)
Glucose, Bld: 124 mg/dL — ABNORMAL HIGH (ref 70–99)
Potassium: 3.4 mmol/L — ABNORMAL LOW (ref 3.5–5.1)
Sodium: 139 mmol/L (ref 135–145)
Total Bilirubin: 0.4 mg/dL (ref 0.3–1.2)
Total Protein: 6.8 g/dL (ref 6.5–8.1)

## 2019-04-23 LAB — CBC WITH DIFFERENTIAL/PLATELET
Abs Immature Granulocytes: 0.01 10*3/uL (ref 0.00–0.07)
Basophils Absolute: 0 10*3/uL (ref 0.0–0.1)
Basophils Relative: 0 %
Eosinophils Absolute: 0.1 10*3/uL (ref 0.0–0.5)
Eosinophils Relative: 4 %
HCT: 32.1 % — ABNORMAL LOW (ref 36.0–46.0)
Hemoglobin: 10.3 g/dL — ABNORMAL LOW (ref 12.0–15.0)
Immature Granulocytes: 0 %
Lymphocytes Relative: 46 %
Lymphs Abs: 1.3 10*3/uL (ref 0.7–4.0)
MCH: 31.1 pg (ref 26.0–34.0)
MCHC: 32.1 g/dL (ref 30.0–36.0)
MCV: 97 fL (ref 80.0–100.0)
Monocytes Absolute: 0.3 10*3/uL (ref 0.1–1.0)
Monocytes Relative: 12 %
Neutro Abs: 1.1 10*3/uL — ABNORMAL LOW (ref 1.7–7.7)
Neutrophils Relative %: 38 %
Platelets: 163 10*3/uL (ref 150–400)
RBC: 3.31 MIL/uL — ABNORMAL LOW (ref 3.87–5.11)
RDW: 16.3 % — ABNORMAL HIGH (ref 11.5–15.5)
WBC: 2.8 10*3/uL — ABNORMAL LOW (ref 4.0–10.5)
nRBC: 0 % (ref 0.0–0.2)

## 2019-04-23 MED ORDER — SODIUM CHLORIDE 0.9% FLUSH
10.0000 mL | INTRAVENOUS | Status: DC | PRN
  Filled 2019-04-23: qty 10

## 2019-04-23 MED ORDER — LEUCOVORIN CALCIUM INJECTION 350 MG
850.0000 mg | Freq: Once | INTRAVENOUS | Status: AC
  Administered 2019-04-23: 850 mg via INTRAVENOUS
  Filled 2019-04-23: qty 42.5

## 2019-04-23 MED ORDER — DEXAMETHASONE SODIUM PHOSPHATE 10 MG/ML IJ SOLN
10.0000 mg | Freq: Once | INTRAMUSCULAR | Status: AC
  Administered 2019-04-23: 10 mg via INTRAVENOUS
  Filled 2019-04-23: qty 1

## 2019-04-23 MED ORDER — CHLORHEXIDINE GLUCONATE 0.12 % MT SOLN: 15.0000 mL | Freq: Two times a day (BID) | OROMUCOSAL | 3 refills | Status: DC

## 2019-04-23 MED ORDER — SODIUM CHLORIDE 0.9 % IV SOLN
5000.0000 mg | INTRAVENOUS | Status: DC
  Administered 2019-04-23: 5000 mg via INTRAVENOUS
  Filled 2019-04-23: qty 100

## 2019-04-23 MED ORDER — HEPARIN SOD (PORK) LOCK FLUSH 100 UNIT/ML IV SOLN
500.0000 [IU] | Freq: Once | INTRAVENOUS | Status: DC | PRN
  Filled 2019-04-23: qty 5

## 2019-04-23 MED ORDER — PALONOSETRON HCL INJECTION 0.25 MG/5ML
0.2500 mg | Freq: Once | INTRAVENOUS | Status: AC
  Administered 2019-04-23: 0.25 mg via INTRAVENOUS
  Filled 2019-04-23: qty 5

## 2019-04-23 MED ORDER — OXALIPLATIN CHEMO INJECTION 100 MG/20ML
71.0000 mg/m2 | Freq: Once | INTRAVENOUS | Status: AC
  Administered 2019-04-23: 150 mg via INTRAVENOUS
  Filled 2019-04-23: qty 20

## 2019-04-23 MED ORDER — SODIUM CHLORIDE 0.9% FLUSH
10.0000 mL | Freq: Once | INTRAVENOUS | Status: AC
  Administered 2019-04-23: 10 mL via INTRAVENOUS
  Filled 2019-04-23: qty 10

## 2019-04-23 MED ORDER — DEXTROSE 5 % IV SOLN
Freq: Once | INTRAVENOUS | Status: AC
  Administered 2019-04-23: 10:00:00 via INTRAVENOUS
  Filled 2019-04-23: qty 250

## 2019-04-23 NOTE — Progress Notes (Signed)
ANC 1100.  Okay to proceed with treatment today per Dr. Tasia Catchings.

## 2019-04-23 NOTE — Progress Notes (Signed)
Hematology/Oncology progress note [facility] Telephone:([phone] [phone]   Patient Care Team: Kirk Ruths, MD as PCP - General (Internal Medicine) Clent Jacks, RN as Registered Nurse  REFERRING PROVIDER: Dr.Secord CHIEF COMPLAINTS/REASON FOR VISIT:  Evaluation of endometrial cancer  HISTORY OF PRESENTING ILLNESS:  Jacqueline Yoder is a  73 y.o.  female with PMH listed below who was referred to me for evaluation of endometrial cancer.   Patient initially presented with complaints of postmenopausal bleeding on 08/16/2018.  History of was menopausal vaginal bleeding in 2016 which resulted in cervical polypectomy.  Pathology 02/04/2015 showed cervical polyp, consistent with benign endometrial polyp.  [name] after polypectomy due to anxiety associated with pelvic exams.  pelvic exam on 08/16/2018 reviewed cervical abnormality and from enlarged uterus. Seen by Dr. Marcelline Mates on 10/29/2018.  Endometrial biopsy and a Pap smear was performed. 10/29/2018 Pap smear showed adenocarcinoma, favor endometrial origin. 10/29/2018 endometrial biopsy showed endometrioid carcinoma, FIGO grade 1.  10/29/2018- TA & TV Ultrasound revealed: Anteverted uterus measuring 8.7 x 5.6 x 6.4 cm without evidence of focal masses.  The endometrium measuring 24.1 mm (thickened) and heterogeneous.  Right and left ovaries not visualized.  No adnexal masses identified.  No free fluid in cul-de-sac.  Patient was seen by Dr. Theora Gianotti in clinic on 11/13/2018.  Cervical exam reveals 2 cm exophytic irregular mass consistent with malignancy.   11/19/2018 CT chest abdomen pelvis with contrast showed thickened endometrium with some irregularity compatible with the provided diagnosis of endometrial malignancy.  There is a mildly prominent left inguinal node 1.4 cm.  Patient was seen by Dr. Fransisca Connors on 11/20/2018 and left groin lymph node biopsy was recommended.  11/26/2018 patient  underwent left inguinal lymph node biopsy. Pathology showed metastatic adenocarcinoma consistent with colorectal origin.  CDX 2+.  Case was discussed on tumor board.  Recommend colonoscopy for further evaluation.  Patient reports significant weight loss 30 pounds over the last year.  Chronic vaginal spotting. Change of bowel habits the past few months.  More constipated.  Family history positive for brother who has colon cancer prostate cancer.  patient has underwent colonoscopy on 12/03/2018 which reviewed a nonobstructing large mass in the rectum.  Also chronic fistula.  Mass was not circumferential.  This was biopsied with a cold forceps for histology.  Pathology came back hyperplastic polyp negative for dysplasia and malignancy. [name], patient underwent flex sigmoidoscopy on 12/06/2018 with rebiopsy of the rectal mass. This time biopsy results came back positive for invasive colorectal adenocarcinoma, moderately differentiated. Immunotherapy for nearly mismatch repair protein (MMR ) was performed.  There is no loss of MMR expression.  low probability of MSI high.   # Seen by [name] for evaluation of resectability for rectal cancer. In addition, she also had a second opinion with [facility] where her endometrial biopsy pathology was changed to  adenocarcinoma, consistent with colorectal primary.   [name]. She has home health that has been assisting with ostomy care  Patient was also evaluated by [facility] oncology.  Recommendation is to proceed with TNT with concurrent chemoradiation followed by neoadjuvant chemotherapy followed by surgical resection. Patient prefers to have treatment done locally with [facility].   # Oncology Treatment:  02/03/2019- 03/19/2019  concurrent Xeloda and radiation.  Xeloda dose 882m /m2 BID - rounded to 16559mBID- on days of radiation. 04/09/2019, started on FOLFOX INTERVAL HISTORY Jacqueline LOMELIis a 73  y.o. female who has above history reviewed by me today presents for evaluation prior to neoadjuvant chemotherapy FOLFOX for treatment of rectal cancer. Status post cycle 1 FOLFOX. Patient reports feeling pretty well today.  Initially she had mild diarrhea.  She takes anti-diarrhea medication as instructed. For the past few days, she has experienced constipation so she took Colace. No nausea, vomiting, fever or chills. Chronic fatigue unchanged.    Review of Systems  Constitutional: Positive for fatigue. Negative for appetite change, chills, fever and unexpected weight change.  HENT:   Negative for hearing loss and voice change.   Eyes: Negative for eye problems.  Respiratory: Negative for chest tightness and cough.   Cardiovascular: Negative for chest pain.  Gastrointestinal: Positive for constipation and diarrhea. Negative for abdominal distention, abdominal pain, blood in stool and nausea.  Endocrine: Negative for hot flashes.  Genitourinary: Negative for difficulty urinating and frequency.   Musculoskeletal: Negative for arthralgias.  Skin: Negative for itching and rash.  Neurological: Negative for extremity weakness.  Hematological: Negative for adenopathy.  Psychiatric/Behavioral: Negative for confusion.    MEDICAL HISTORY:  Past Medical History:  Diagnosis Date  . Arthritis   . Cancer ([place])    rectal  . GERD (gastroesophageal reflux disease)   . Hypercholesteremia   . Hypertension   . Hypertension   . Lower extremity edema   . Urinary incontinence     SURGICAL HISTORY: Past Surgical History:  Procedure Laterality Date  . CHOLECYSTECTOMY  1971  . COLONOSCOPY WITH PROPOFOL N/A 12/03/2018   Procedure: COLONOSCOPY WITH PROPOFOL;  Surgeon: Lucilla Lame, MD;  Location: [facility] ENDOSCOPY;  Service: Endoscopy;  Laterality: N/A;  . FLEXIBLE SIGMOIDOSCOPY N/A 12/06/2018   Procedure: FLEXIBLE SIGMOIDOSCOPY;  Surgeon: [name], MD;  Location: [facility] ENDOSCOPY;  Service:  Endoscopy;  Laterality: N/A;  . LAPAROSCOPIC COLOSTOMY  01/06/2019  . PORTACATH PLACEMENT N/A 04/03/2019   Procedure: INSERTION PORT-A-CATH;  Surgeon: Jules Husbands, MD;  Location: [facility] ORS;  Service: General;  Laterality: N/A;    SOCIAL HISTORY: Social History   Socioeconomic History  . Marital status: Married    Spouse name: Not on file  . Number of children: Not on file  . Years of education: Not on file  . Highest education level: Not on file  Occupational History  . Not on file  Social Needs  . Financial resource strain: Not on file  . Food insecurity    Worry: Not on file    Inability: Not on file  . Transportation needs    Medical: Not on file    Non-medical: Not on file  Tobacco Use  . Smoking status: Former Smoker    Quit date: 12/02/1977    Years since quitting: 41.4  . Smokeless tobacco: Never Used  Substance and Sexual Activity  . Alcohol use: Never    Frequency: Never  . Drug use: Never  . Sexual activity: Not Currently    Birth control/protection: None  Lifestyle  . Physical activity    Days per week: Not on file    Minutes per session: Not on file  . Stress: Not on file  Relationships  . Social Herbalist on phone: Not on file    Gets together: Not on file    Attends religious service: Not on file    Active member of club or organization: Not on file    Attends meetings of clubs or organizations: Not on file    Relationship status: Not on file  .  Intimate partner violence    Fear of current or ex partner: Not on file    Emotionally abused: Not on file    Physically abused: Not on file    Forced sexual activity: Not on file  Other Topics Concern  . Not on file  Social History Narrative  . Not on file    FAMILY HISTORY: Family History  Problem Relation Age of Onset  . Colon cancer Brother   . Prostate cancer Brother   . Hypertension Mother   . Stroke Mother   . Kidney failure Father   . Breast cancer Neg Hx   . Ovarian cancer  Neg Hx     ALLERGIES:  is allergic to sulfamethoxazole-trimethoprim.  MEDICATIONS:  Current Outpatient Medications  Medication Sig Dispense Refill  . Cholecalciferol (VITAMIN D3) 2000 units capsule Take 2,000 Units by mouth daily.    . diclofenac sodium (VOLTAREN) 1 % GEL Apply 2 g topically 4 (four) times daily as needed (joint pain).   11  . fexofenadine (ALLEGRA) 180 MG tablet Take 180 mg by mouth daily.    . fluticasone (FLONASE) 50 MCG/ACT nasal spray Place 1 spray into both nostrils 2 (two) times a day.     Marland Kitchen HYDROcodone-acetaminophen (NORCO/VICODIN) 5-325 MG tablet Take 1 tablet by mouth every 4 (four) hours as needed for moderate pain. 8 tablet 0  . ketorolac (ACULAR) 0.4 % SOLN Place 1 drop into both eyes 2 (two) times daily.     Marland Kitchen lidocaine-prilocaine (EMLA) cream Apply 1 application topically as needed. 30 g 6  . lidocaine-prilocaine (EMLA) cream Apply to affected area once 30 g 4  . loperamide (IMODIUM) 2 MG capsule Take 1 capsule (2 mg total) by mouth See admin instructions. With onset of loose stool, take '4mg'$  followed by '2mg'$  every 2 hours,  Maximum: 16 mg/day 120 capsule 1  . Lutein 40 MG CAPS Take 40 mg by mouth daily.     . Lutein-Bilberry (BILBERRY PLUS LUTEIN) 02-999 MCG-MG CAPS Take 1 capsule by mouth daily.     . Multiple Vitamins-Minerals (ONE-A-DAY WOMENS 50 PLUS PO) Take 1 tablet by mouth daily.     Marland Kitchen nystatin (MYCOSTATIN/NYSTOP) powder Apply topically 4 (four) times daily. 30 g 0  . ondansetron (ZOFRAN) 8 MG tablet Take 1 tablet (8 mg total) by mouth every 8 (eight) hours as needed for nausea, vomiting or refractory nausea / vomiting. 90 tablet 1  . ondansetron (ZOFRAN) 8 MG tablet Take 1 tablet (8 mg total) by mouth 2 (two) times daily as needed for refractory nausea / vomiting. Start on day 3 after chemotherapy. 30 tablet 1  . pantoprazole (PROTONIX) 40 MG tablet Take 1 tablet (40 mg total) by mouth as needed. (Patient taking differently: Take 40 mg by mouth daily as  needed (heartburn). ) 30 tablet 0  . potassium chloride SA (K-DUR) 20 MEQ tablet Take 1 tablet (20 mEq total) by mouth daily. 14 tablet 0  . prednisoLONE acetate (PRED FORTE) 1 % ophthalmic suspension Place 1 drop into both eyes 2 (two) times a day.   1  . prochlorperazine (COMPAZINE) 10 MG tablet Take 1 tablet (10 mg total) by mouth every 6 (six) hours as needed (Nausea or vomiting). 30 tablet 1  . simvastatin (ZOCOR) 40 MG tablet Take 40 mg by mouth at bedtime.     . triamterene-hydrochlorothiazide (DYAZIDE) 37.5-25 MG capsule Take 1 capsule by mouth daily.     . chlorhexidine (PERIDEX) 0.12 % solution Use as directed  15 mLs in the mouth or throat 2 (two) times daily. 473 mL 3   No current facility-administered medications for this visit.    Facility-Administered Medications Ordered in Other Visits  Medication Dose Route Frequency Provider Last Rate Last Dose  . fluorouracil (ADRUCIL) 5,000 mg in sodium chloride 0.9 % 150 mL chemo infusion  5,000 mg Intravenous 1 day or 1 dose Earlie Server, MD      . heparin lock flush 100 unit/mL  500 Units Intracatheter Once PRN Earlie Server, MD      . leucovorin 850 mg in dextrose 5 % 250 mL infusion  850 mg Intravenous Once Earlie Server, MD 146 mL/hr at 04/23/19 1003 850 mg at 04/23/19 1003  . oxaliplatin (ELOXATIN) 150 mg in dextrose 5 % 500 mL chemo infusion  71 mg/m2 (Order-Specific) Intravenous Once Earlie Server, MD 265 mL/hr at 04/23/19 1002 150 mg at 04/23/19 1002  . sodium chloride flush (NS) 0.9 % injection 10 mL  10 mL Intracatheter PRN Earlie Server, MD         PHYSICAL EXAMINATION: ECOG PERFORMANCE STATUS: 1 - Symptomatic but completely ambulatory Vitals:   04/23/19 0846  BP: (!) 95/59  Pulse: 82  Temp: (!) 97.1 F (36.2 C)   Filed Weights   04/23/19 0846  Weight: 212 lb 1 oz (96.2 kg)    Physical Exam Constitutional:      General: She is not in acute distress. HENT:     Head: Normocephalic and atraumatic.  Eyes:     General: No scleral icterus.     Pupils: Pupils are equal, round, and reactive to light.  Neck:     Musculoskeletal: Normal range of motion and neck supple.  Cardiovascular:     Rate and Rhythm: Normal rate and regular rhythm.     Heart sounds: Normal heart sounds.  Pulmonary:     Effort: Pulmonary effort is normal. No respiratory distress.     Breath sounds: No wheezing.  Abdominal:     General: Bowel sounds are normal. There is no distension.     Palpations: Abdomen is soft. There is no mass.     Tenderness: There is no abdominal tenderness.     Comments: + colostomy bag with small amount of yellow color loose stool.   Musculoskeletal: Normal range of motion.        General: No deformity.  Skin:    General: Skin is warm and dry.     Findings: No erythema or rash.  Neurological:     Mental Status: She is alert and oriented to person, place, and time.     Cranial Nerves: No cranial nerve deficit.     Coordination: Coordination normal.  Psychiatric:        Behavior: Behavior normal.        Thought Content: Thought content normal.     RADIOGRAPHIC STUDIES: I have personally reviewed the radiological images as listed and agreed with the findings in the report.  CMP Latest Ref Rng & Units 04/23/2019  Glucose 70 - 99 mg/dL 124(H)  BUN 8 - 23 mg/dL 7(L)  Creatinine 0.44 - 1.00 mg/dL 0.93  Sodium 135 - 145 mmol/L 139  Potassium 3.5 - 5.1 mmol/L 3.4(L)  Chloride 98 - 111 mmol/L 103  CO2 22 - 32 mmol/L 27  Calcium 8.9 - 10.3 mg/dL 9.3  Total Protein 6.5 - 8.1 g/dL 6.8  Total Bilirubin 0.3 - 1.2 mg/dL 0.4  Alkaline Phos 38 - 126 U/L 38  AST  15 - 41 U/L 23  ALT 0 - 44 U/L 11   CBC Latest Ref Rng & Units 04/23/2019  WBC 4.0 - 10.5 K/uL 2.8(L)  Hemoglobin 12.0 - 15.0 g/dL 10.3(L)  Hematocrit 36.0 - 46.0 % 32.1(L)  Platelets 150 - 400 K/uL 163    LABORATORY DATA:  I have reviewed the data as listed Lab Results  Component Value Date   WBC 2.8 (L) 04/23/2019   HGB 10.3 (L) 04/23/2019   HCT 32.1 (L)  04/23/2019   MCV 97.0 04/23/2019   PLT 163 04/23/2019   Recent Labs    04/09/19 0757 04/16/19 1246 04/23/19 0757  NA 139 141 139  K 3.6 3.6 3.4*  CL 103 102 103  CO2 '26 28 27  '$ GLUCOSE 124* 121* 124*  BUN 7* 9 7*  CREATININE 1.00 1.17* 0.93  CALCIUM 9.4 9.4 9.3  GFRNONAA 56* 47* >60  GFRAA >60 54* >60  PROT 7.0 7.2 6.8  ALBUMIN 3.9 3.9 3.9  AST '24 24 23  '$ ALT '12 15 11  '$ ALKPHOS 38 35* 38  BILITOT 0.5 0.4 0.4   Iron/TIBC/Ferritin/ %Sat No results found for: IRON, TIBC, FERRITIN, IRONPCTSAT   RADIOGRAPHIC STUDIES: I have personally reviewed the radiological images as listed and agreed with the findings in the report. #11/19/2018 CT chest abdomen pelvis chest 1. Thickened endometrium with some irregularity compatible with the provided diagnosis of endometrial malignancy. There is a mildly prominent left inguinal lymph node at 1.4 cm in short axis which may reflect nodal spread. 2. Bilateral cystic renal lesions. We do not have precontrast images in order to be able to assigned Bosniak classifications. No significant degree of de-enhancement to favor solid mass. 3. Aortic Atherosclerosis (ICD10-I70.0). Coronary atherosclerosis. Sigmoid colon diverticulosis, with several areas of mild colon wall thickening probably from contraction. Correlation with the patient's colon cancer screening history is recommended. If screening is not up-to-date, appropriate screening should be considered. 4. Lumbar impingement at L3-4 and L4-5.5. Esophageal reflux versus dysmotility.  ASSESSMENT & PLAN:  1. Rectal cancer ([place])   2. Encounter for antineoplastic chemotherapy   3. Anemia due to antineoplastic chemotherapy   4. Chemotherapy induced neutropenia [facility])   Cancer Staging Rectal cancer [facility]) Staging form: Colon and Rectum, AJCC 8th Edition - Clinical stage from 01/23/2019: Stage IIIC (cT4b, cN1a, cM0) - Signed by Earlie Server, MD on 01/23/2019 #Rectal cancer Labs reviewed and discussed with patient.  Counts are acceptable to proceed with cycle 2 FOLFOX.  #Chemotherapy-induced neutropenia, ANC 1.1 today.  Anticipate ANC will further decreased after cycle 2 FOLFOX. Plan repeat CBC in 1 week and proceed with G-CSF support for neutropenia prophylaxis.  #Chemotherapy-induced anemia, hemoglobin 10.3, stable.  Continue to monitor. #Diarrhea due to chemotherapy, stable.  Advised patient to use Imodium as instructed. #Elevated creatinine, patient has improved oral hydration. #Hypokalemia, potassium 3.4 today.  Recommend patient to continue take potassium chloride 20 mEq daily.  #Family history with half brother having's history of colon cancer prostate cancer.  Personal history of colorectal cancer. We discussed about genetic testing. Patient has not decided and she will update me if she is interested.  Follow up in 1 week   Earlie Server, MD, PhD

## 2019-04-23 NOTE — Progress Notes (Signed)
Patient here today for follow up.  Patient states no new concerns today  

## 2019-04-24 ENCOUNTER — Ambulatory Visit
Admission: RE | Admit: 2019-04-24 | Discharge: 2019-04-24 | Disposition: A | Discharge status: Home / Self Care | Payer: Medicare Other | Source: Ambulatory Visit | Attending: Radiation Oncology | Admitting: Radiation Oncology

## 2019-04-24 ENCOUNTER — Other Ambulatory Visit: Payer: Self-pay

## 2019-04-24 ENCOUNTER — Encounter: Payer: Self-pay | Admitting: Radiation Oncology

## 2019-04-24 VITALS — BP 128/63 | HR 61 | Temp 95.8°F | Resp 18 | Wt 212.4 lb

## 2019-04-24 DIAGNOSIS — Z933 Colostomy status: Secondary | ICD-10-CM | POA: Diagnosis not present

## 2019-04-24 DIAGNOSIS — C7982 Secondary malignant neoplasm of genital organs: Secondary | ICD-10-CM | POA: Diagnosis not present

## 2019-04-24 DIAGNOSIS — C20 Malignant neoplasm of rectum: Secondary | ICD-10-CM | POA: Insufficient documentation

## 2019-04-24 NOTE — Progress Notes (Signed)
Radiation Oncology Follow up Note  Name: Jacqueline Yoder   Date:   04/24/2019 MRN:  [id number] DOB: October 07, 1946    This 73 y.o. female presents to the clinic today for 1 month follow-up status post locally advanced rectal cancer status post neoadjuvant chemoradiation.  REFERRING PROVIDER: Kirk Ruths, MD  HPI: Patient is a 73 year old female presented with postmenopausal bleeding.  She was found back in January 2020 to have endometrioid carcinoma FIGO grade 1..  She was noted on pelvic examination back in February to have a 2 cm exophytic irregular mass consistent with malignancy on pelvic exam.  CT scan of abdomen pelvis when CT scan demonstrated a left groin lymph node which was biopsied.  Pathology was consistent with adenocarcinoma consistent with rectal origin.  She underwent colonoscopy in February 2020 showing a nonobstructive mass in the rectum biopsy was positive for hyperplastic polyp.  She underwent a flex sigmoidoscopy with rebiopsy the rectal mass now positive for invasive colorectal cancer.  Tumor was moderately differentiated there was no loss of MMR expression.  She was evaluated by [name] her endometrial biopsy was changed to pathology consistent with colorectal primary.  She underwent a diversion colostomy then completed concurrent chemoradiation.  She is seen today in routine follow-up and is doing well.  She is currently on FOLFOX chemotherapy in the neoadjuvant setting probably prior to surgical resection.  She states she is having no bleeding per rectum.  She specifically denies any significant abdominal complaints.  COMPLICATIONS OF TREATMENT: none  FOLLOW UP COMPLIANCE: keeps appointments   PHYSICAL EXAM:  BP 128/63 (BP Location: Left Arm, Patient Position: Sitting)   Pulse 61   Temp (!) 95.8 F (35.4 C) (Tympanic)   Resp 18   Wt 212 lb 6.6 oz (96.3 kg)   BMI 35.35 kg/m  Well-developed well-nourished patient in NAD. HEENT reveals PERLA, EOMI, discs not  visualized.  Oral cavity is clear. No oral mucosal lesions are identified. Neck is clear without evidence of cervical or supraclavicular adenopathy. Lungs are clear to A&P. Cardiac examination is essentially unremarkable with regular rate and rhythm without murmur rub or thrill. Abdomen is benign with no organomegaly or masses noted. Motor sensory and DTR levels are equal and symmetric in the upper and lower extremities. Cranial nerves II through XII are grossly intact. Proprioception is intact. No peripheral adenopathy or edema is identified. No motor or sensory levels are noted. Crude visual fields are within normal range.  RADIOLOGY RESULTS: No current films for review  PLAN: At the present time she continues on FOLFOX chemotherapy under medical oncology's direction she will then be evaluated for surgical resection at [facility].  I have asked to see her back in 4 to 5 months for follow-up.  She seems to have tolerated her prior neoadjuvant chemoradiation well.  Patient knows to call with any concerns.  I would like to take this opportunity to thank you for allowing me to participate in the care of your patient.Noreene Filbert, MD

## 2019-04-25 ENCOUNTER — Other Ambulatory Visit: Payer: Self-pay

## 2019-04-25 ENCOUNTER — Inpatient Hospital Stay: Payer: Medicare Other

## 2019-04-25 VITALS — BP 130/73 | HR 98 | Temp 96.6°F | Resp 18

## 2019-04-25 DIAGNOSIS — C20 Malignant neoplasm of rectum: Secondary | ICD-10-CM | POA: Diagnosis not present

## 2019-04-25 MED ORDER — SODIUM CHLORIDE 0.9% FLUSH
10.0000 mL | INTRAVENOUS | Status: DC | PRN
  Administered 2019-04-25: 10 mL
  Filled 2019-04-25: qty 10

## 2019-04-25 MED ORDER — HEPARIN SOD (PORK) LOCK FLUSH 100 UNIT/ML IV SOLN
INTRAVENOUS | Status: AC
  Filled 2019-04-25: qty 5

## 2019-04-25 MED ORDER — HEPARIN SOD (PORK) LOCK FLUSH 100 UNIT/ML IV SOLN
500.0000 [IU] | Freq: Once | INTRAVENOUS | Status: AC | PRN
  Administered 2019-04-25: 500 [IU]

## 2019-04-29 ENCOUNTER — Other Ambulatory Visit: Payer: Self-pay

## 2019-04-29 ENCOUNTER — Inpatient Hospital Stay: Payer: Medicare Other

## 2019-04-29 ENCOUNTER — Inpatient Hospital Stay (HOSPITAL_BASED_OUTPATIENT_CLINIC_OR_DEPARTMENT_OTHER): Payer: Medicare Other | Admitting: Oncology

## 2019-04-29 ENCOUNTER — Encounter: Payer: Self-pay | Admitting: Oncology

## 2019-04-29 VITALS — BP 118/72 | HR 97 | Temp 96.4°F | Wt 209.3 lb

## 2019-04-29 DIAGNOSIS — D6481 Anemia due to antineoplastic chemotherapy: Secondary | ICD-10-CM

## 2019-04-29 DIAGNOSIS — C20 Malignant neoplasm of rectum: Secondary | ICD-10-CM

## 2019-04-29 DIAGNOSIS — Z87891 Personal history of nicotine dependence: Secondary | ICD-10-CM

## 2019-04-29 DIAGNOSIS — I700 Atherosclerosis of aorta: Secondary | ICD-10-CM

## 2019-04-29 DIAGNOSIS — R7989 Other specified abnormal findings of blood chemistry: Secondary | ICD-10-CM

## 2019-04-29 DIAGNOSIS — I10 Essential (primary) hypertension: Secondary | ICD-10-CM

## 2019-04-29 DIAGNOSIS — R197 Diarrhea, unspecified: Secondary | ICD-10-CM

## 2019-04-29 DIAGNOSIS — E559 Vitamin D deficiency, unspecified: Secondary | ICD-10-CM | POA: Insufficient documentation

## 2019-04-29 DIAGNOSIS — K5900 Constipation, unspecified: Secondary | ICD-10-CM

## 2019-04-29 DIAGNOSIS — Z5111 Encounter for antineoplastic chemotherapy: Secondary | ICD-10-CM | POA: Diagnosis not present

## 2019-04-29 DIAGNOSIS — M129 Arthropathy, unspecified: Secondary | ICD-10-CM

## 2019-04-29 DIAGNOSIS — R634 Abnormal weight loss: Secondary | ICD-10-CM

## 2019-04-29 DIAGNOSIS — K521 Toxic gastroenteritis and colitis: Secondary | ICD-10-CM

## 2019-04-29 DIAGNOSIS — T451X5A Adverse effect of antineoplastic and immunosuppressive drugs, initial encounter: Secondary | ICD-10-CM

## 2019-04-29 DIAGNOSIS — D701 Agranulocytosis secondary to cancer chemotherapy: Secondary | ICD-10-CM

## 2019-04-29 DIAGNOSIS — R609 Edema, unspecified: Secondary | ICD-10-CM

## 2019-04-29 DIAGNOSIS — E860 Dehydration: Secondary | ICD-10-CM

## 2019-04-29 DIAGNOSIS — E876 Hypokalemia: Secondary | ICD-10-CM

## 2019-04-29 DIAGNOSIS — E7800 Pure hypercholesterolemia, unspecified: Secondary | ICD-10-CM

## 2019-04-29 DIAGNOSIS — R1013 Epigastric pain: Secondary | ICD-10-CM | POA: Insufficient documentation

## 2019-04-29 DIAGNOSIS — Z79899 Other long term (current) drug therapy: Secondary | ICD-10-CM

## 2019-04-29 DIAGNOSIS — K219 Gastro-esophageal reflux disease without esophagitis: Secondary | ICD-10-CM

## 2019-04-29 LAB — COMPREHENSIVE METABOLIC PANEL
ALT: 14 U/L (ref 0–44)
AST: 24 U/L (ref 15–41)
Albumin: 4.2 g/dL (ref 3.5–5.0)
Alkaline Phosphatase: 36 U/L — ABNORMAL LOW (ref 38–126)
Anion gap: 11 (ref 5–15)
BUN: 9 mg/dL (ref 8–23)
CO2: 28 mmol/L (ref 22–32)
Calcium: 9.7 mg/dL (ref 8.9–10.3)
Chloride: 103 mmol/L (ref 98–111)
Creatinine, Ser: 0.97 mg/dL (ref 0.44–1.00)
GFR calc Af Amer: >60 mL/min (ref >60)
GFR calc non Af Amer: 58 mL/min — ABNORMAL LOW (ref >60)
Glucose, Bld: 112 mg/dL — ABNORMAL HIGH (ref 70–99)
Potassium: 3.5 mmol/L (ref 3.5–5.1)
Sodium: 142 mmol/L (ref 135–145)
Total Bilirubin: 0.7 mg/dL (ref 0.3–1.2)
Total Protein: 7.1 g/dL (ref 6.5–8.1)

## 2019-04-29 LAB — CBC WITH DIFFERENTIAL/PLATELET
Abs Immature Granulocytes: 0.04 10*3/uL (ref 0.00–0.07)
Basophils Absolute: 0 10*3/uL (ref 0.0–0.1)
Basophils Relative: 0 %
Eosinophils Absolute: 0.1 10*3/uL (ref 0.0–0.5)
Eosinophils Relative: 2 %
HCT: 34.4 % — ABNORMAL LOW (ref 36.0–46.0)
Hemoglobin: 11.2 g/dL — ABNORMAL LOW (ref 12.0–15.0)
Immature Granulocytes: 1 %
Lymphocytes Relative: 35 %
Lymphs Abs: 1.1 10*3/uL (ref 0.7–4.0)
MCH: 31.6 pg (ref 26.0–34.0)
MCHC: 32.6 g/dL (ref 30.0–36.0)
MCV: 97.2 fL (ref 80.0–100.0)
Monocytes Absolute: 0.2 10*3/uL (ref 0.1–1.0)
Monocytes Relative: 7 %
Neutro Abs: 1.8 10*3/uL (ref 1.7–7.7)
Neutrophils Relative %: 55 %
Platelets: 159 10*3/uL (ref 150–400)
RBC: 3.54 MIL/uL — ABNORMAL LOW (ref 3.87–5.11)
RDW: 15.7 % — ABNORMAL HIGH (ref 11.5–15.5)
WBC: 3.2 10*3/uL — ABNORMAL LOW (ref 4.0–10.5)
nRBC: 0.6 % — ABNORMAL HIGH (ref 0.0–0.2)

## 2019-04-29 NOTE — Progress Notes (Signed)
Hematology/Oncology progress note [facility] Telephone:([phone] [phone]   Patient Care Team: Kirk Ruths, MD as PCP - General (Internal Medicine) Clent Jacks, RN as Registered Nurse  REFERRING PROVIDER: Dr.Secord CHIEF COMPLAINTS/REASON FOR VISIT:  Evaluation of endometrial cancer  HISTORY OF PRESENTING ILLNESS:  Jacqueline Yoder is a  73 y.o.  female with PMH listed below who was referred to me for evaluation of endometrial cancer.   Patient initially presented with complaints of postmenopausal bleeding on 08/16/2018.  History of was menopausal vaginal bleeding in 2016 which resulted in cervical polypectomy.  Pathology 02/04/2015 showed cervical polyp, consistent with benign endometrial polyp.  [name] after polypectomy due to anxiety associated with pelvic exams.  pelvic exam on 08/16/2018 reviewed cervical abnormality and from enlarged uterus. Seen by Dr. Marcelline Mates on 10/29/2018.  Endometrial biopsy and a Pap smear was performed. 10/29/2018 Pap smear showed adenocarcinoma, favor endometrial origin. 10/29/2018 endometrial biopsy showed endometrioid carcinoma, FIGO grade 1.  10/29/2018- TA & TV Ultrasound revealed: Anteverted uterus measuring 8.7 x 5.6 x 6.4 cm without evidence of focal masses.  The endometrium measuring 24.1 mm (thickened) and heterogeneous.  Right and left ovaries not visualized.  No adnexal masses identified.  No free fluid in cul-de-sac.  Patient was seen by Dr. Theora Gianotti in clinic on 11/13/2018.  Cervical exam reveals 2 cm exophytic irregular mass consistent with malignancy.   11/19/2018 CT chest abdomen pelvis with contrast showed thickened endometrium with some irregularity compatible with the provided diagnosis of endometrial malignancy.  There is a mildly prominent left inguinal node 1.4 cm.  Patient was seen by Dr. Fransisca Connors on 11/20/2018 and left groin lymph node biopsy was recommended.  11/26/2018 patient  underwent left inguinal lymph node biopsy. Pathology showed metastatic adenocarcinoma consistent with colorectal origin.  CDX 2+.  Case was discussed on tumor board.  Recommend colonoscopy for further evaluation.  Patient reports significant weight loss 30 pounds over the last year.  Chronic vaginal spotting. Change of bowel habits the past few months.  More constipated.  Family history positive for brother who has colon cancer prostate cancer.  patient has underwent colonoscopy on 12/03/2018 which reviewed a nonobstructing large mass in the rectum.  Also chronic fistula.  Mass was not circumferential.  This was biopsied with a cold forceps for histology.  Pathology came back hyperplastic polyp negative for dysplasia and malignancy. [name], patient underwent flex sigmoidoscopy on 12/06/2018 with rebiopsy of the rectal mass. This time biopsy results came back positive for invasive colorectal adenocarcinoma, moderately differentiated. Immunotherapy for nearly mismatch repair protein (MMR ) was performed.  There is no loss of MMR expression.  low probability of MSI high.   # Seen by [name] for evaluation of resectability for rectal cancer. In addition, she also had a second opinion with [facility] where her endometrial biopsy pathology was changed to  adenocarcinoma, consistent with colorectal primary.   [name]. She has home health that has been assisting with ostomy care  Patient was also evaluated by [facility] oncology.  Recommendation is to proceed with TNT with concurrent chemoradiation followed by neoadjuvant chemotherapy followed by surgical resection. Patient prefers to have treatment done locally with [facility].   # Oncology Treatment:  02/03/2019- 03/19/2019  concurrent Xeloda and radiation.  Xeloda dose 882m /m2 BID - rounded to 16559mBID- on days of radiation. 04/09/2019, started on FOLFOX INTERVAL HISTORY Jacqueline LOMELIis a 73  y.o. female who has above history reviewed by me today presents for evaluation after neoadjuvant chemotherapy FOLFOX for treatment of rectal cancer. Status post 2 cycles of FOLFOX. Patient has grade 1 neutropenia prior to cycle 2 FOLFOX. She has had labs rechecked today and also for reassess of the need for G-CSF support. Patient reports feeling pretty well today. She has intermittent diarrhea/constipation and she has been alternating Colace and Imodium. Patient reports that when she gets " constipated", she gets lumps of stools in the stool back, then she will take a few Colace to loosen the stool and then she will take Imodium once stool is getting too loose.  Denies any pain.  No nausea vomiting, fever or chills. Chronic fatigue unchanged.  No fevers or chills    Review of Systems  Constitutional: Positive for fatigue. Negative for appetite change, chills, fever and unexpected weight change.  HENT:   Negative for hearing loss and voice change.   Eyes: Negative for eye problems.  Respiratory: Negative for chest tightness and cough.   Cardiovascular: Negative for chest pain.  Gastrointestinal: Positive for constipation and diarrhea. Negative for abdominal distention, abdominal pain, blood in stool and nausea.  Endocrine: Negative for hot flashes.  Genitourinary: Negative for difficulty urinating and frequency.   Musculoskeletal: Negative for arthralgias.  Skin: Negative for itching and rash.  Neurological: Negative for extremity weakness.  Hematological: Negative for adenopathy.  Psychiatric/Behavioral: Negative for confusion.    MEDICAL HISTORY:  Past Medical History:  Diagnosis Date  . Arthritis   . Cancer ([place])    rectal  . GERD (gastroesophageal reflux disease)   . Hypercholesteremia   . Hypertension   . Hypertension   . Lower extremity edema   . Urinary incontinence     SURGICAL HISTORY: Past Surgical History:  Procedure Laterality Date  . CHOLECYSTECTOMY   1971  . COLONOSCOPY WITH PROPOFOL N/A 12/03/2018   Procedure: COLONOSCOPY WITH PROPOFOL;  Surgeon: Lucilla Lame, MD;  Location: [facility] ENDOSCOPY;  Service: Endoscopy;  Laterality: N/A;  . FLEXIBLE SIGMOIDOSCOPY N/A 12/06/2018   Procedure: FLEXIBLE SIGMOIDOSCOPY;  Surgeon: [name], MD;  Location: [facility] ENDOSCOPY;  Service: Endoscopy;  Laterality: N/A;  . LAPAROSCOPIC COLOSTOMY  01/06/2019  . PORTACATH PLACEMENT N/A 04/03/2019   Procedure: INSERTION PORT-A-CATH;  Surgeon: Jules Husbands, MD;  Location: [facility] ORS;  Service: General;  Laterality: N/A;    SOCIAL HISTORY: Social History   Socioeconomic History  . Marital status: Married    Spouse name: Not on file  . Number of children: Not on file  . Years of education: Not on file  . Highest education level: Not on file  Occupational History  . Not on file  Social Needs  . Financial resource strain: Not on file  . Food insecurity    Worry: Not on file    Inability: Not on file  . Transportation needs    Medical: Not on file    Non-medical: Not on file  Tobacco Use  . Smoking status: Former Smoker    Quit date: 12/02/1977    Years since quitting: 41.4  . Smokeless tobacco: Never Used  Substance and Sexual Activity  . Alcohol use: Never    Frequency: Never  . Drug use: Never  . Sexual activity: Not Currently    Birth control/protection: None  Lifestyle  . Physical activity    Days per week: Not on file    Minutes per session: Not on file  . Stress: Not on file  Relationships  .  Social Herbalist on phone: Not on file    Gets together: Not on file    Attends religious service: Not on file    Active member of club or organization: Not on file    Attends meetings of clubs or organizations: Not on file    Relationship status: Not on file  . Intimate partner violence    Fear of current or ex partner: Not on file    Emotionally abused: Not on file    Physically abused: Not on file    Forced sexual activity: Not on  file  Other Topics Concern  . Not on file  Social History Narrative  . Not on file    FAMILY HISTORY: Family History  Problem Relation Age of Onset  . Colon cancer Brother   . Prostate cancer Brother   . Hypertension Mother   . Stroke Mother   . Kidney failure Father   . Breast cancer Neg Hx   . Ovarian cancer Neg Hx     ALLERGIES:  is allergic to sulfamethoxazole-trimethoprim.  MEDICATIONS:  Current Outpatient Medications  Medication Sig Dispense Refill  . chlorhexidine (PERIDEX) 0.12 % solution Use as directed 15 mLs in the mouth or throat 2 (two) times daily. 473 mL 3  . Cholecalciferol (VITAMIN D3) 2000 units capsule Take 2,000 Units by mouth daily.    . diclofenac sodium (VOLTAREN) 1 % GEL Apply 2 g topically 4 (four) times daily as needed (joint pain).   11  . fexofenadine (ALLEGRA) 180 MG tablet Take 180 mg by mouth daily.    . fluticasone (FLONASE) 50 MCG/ACT nasal spray Place 1 spray into both nostrils 2 (two) times a day.     Marland Kitchen HYDROcodone-acetaminophen (NORCO/VICODIN) 5-325 MG tablet Take 1 tablet by mouth every 4 (four) hours as needed for moderate pain. 8 tablet 0  . ketorolac (ACULAR) 0.4 % SOLN Place 1 drop into both eyes 2 (two) times daily.     Marland Kitchen lidocaine-prilocaine (EMLA) cream Apply 1 application topically as needed. 30 g 6  . lidocaine-prilocaine (EMLA) cream Apply to affected area once 30 g 4  . loperamide (IMODIUM) 2 MG capsule Take 1 capsule (2 mg total) by mouth See admin instructions. With onset of loose stool, take 78m followed by 2109mevery 2 hours,  Maximum: 16 mg/day 120 capsule 1  . Lutein 40 MG CAPS Take 40 mg by mouth daily.     . Lutein-Bilberry (BILBERRY PLUS LUTEIN) 02-999 MCG-MG CAPS Take 1 capsule by mouth daily.     . Multiple Vitamins-Minerals (ONE-A-DAY WOMENS 50 PLUS PO) Take 1 tablet by mouth daily.     . Marland Kitchenystatin (MYCOSTATIN/NYSTOP) powder Apply topically 4 (four) times daily. 30 g 0  . ondansetron (ZOFRAN) 8 MG tablet Take 1 tablet (8  mg total) by mouth every 8 (eight) hours as needed for nausea, vomiting or refractory nausea / vomiting. 90 tablet 1  . ondansetron (ZOFRAN) 8 MG tablet Take 1 tablet (8 mg total) by mouth 2 (two) times daily as needed for refractory nausea / vomiting. Start on day 3 after chemotherapy. 30 tablet 1  . pantoprazole (PROTONIX) 40 MG tablet Take 1 tablet (40 mg total) by mouth as needed. (Patient taking differently: Take 40 mg by mouth daily as needed (heartburn). ) 30 tablet 0  . potassium chloride SA (K-DUR) 20 MEQ tablet Take 1 tablet (20 mEq total) by mouth daily. 14 tablet 0  . prednisoLONE acetate (PRED FORTE) 1 %  ophthalmic suspension Place 1 drop into both eyes 2 (two) times a day.   1  . prochlorperazine (COMPAZINE) 10 MG tablet Take 1 tablet (10 mg total) by mouth every 6 (six) hours as needed (Nausea or vomiting). 30 tablet 1  . simvastatin (ZOCOR) 40 MG tablet Take 40 mg by mouth at bedtime.     . triamterene-hydrochlorothiazide (DYAZIDE) 37.5-25 MG capsule Take 1 capsule by mouth daily.      No current facility-administered medications for this visit.      PHYSICAL EXAMINATION: ECOG PERFORMANCE STATUS: 1 - Symptomatic but completely ambulatory Vitals:   04/29/19 0856  BP: 118/72  Pulse: 97  Temp: (!) 96.4 F (35.8 C)   Filed Weights   04/29/19 0856  Weight: 209 lb 4.8 oz (94.9 kg)    Physical Exam Constitutional:      General: She is not in acute distress. HENT:     Head: Normocephalic and atraumatic.  Eyes:     General: No scleral icterus.    Pupils: Pupils are equal, round, and reactive to light.  Neck:     Musculoskeletal: Normal range of motion and neck supple.  Cardiovascular:     Rate and Rhythm: Normal rate and regular rhythm.     Heart sounds: Normal heart sounds.  Pulmonary:     Effort: Pulmonary effort is normal. No respiratory distress.     Breath sounds: No wheezing.  Abdominal:     General: Bowel sounds are normal. There is no distension.      Palpations: Abdomen is soft. There is no mass.     Tenderness: There is no abdominal tenderness.     Comments: + colostomy bag with small amount of yellow color loose stool.   Musculoskeletal: Normal range of motion.        General: No deformity.  Skin:    General: Skin is warm and dry.     Findings: No erythema or rash.  Neurological:     Mental Status: She is alert and oriented to person, place, and time.     Cranial Nerves: No cranial nerve deficit.     Coordination: Coordination normal.  Psychiatric:        Behavior: Behavior normal.        Thought Content: Thought content normal.     RADIOGRAPHIC STUDIES: I have personally reviewed the radiological images as listed and agreed with the findings in the report.  CMP Latest Ref Rng & Units 04/29/2019  Glucose 70 - 99 mg/dL 112(H)  BUN 8 - 23 mg/dL 9  Creatinine 0.44 - 1.00 mg/dL 0.97  Sodium 135 - 145 mmol/L 142  Potassium 3.5 - 5.1 mmol/L 3.5  Chloride 98 - 111 mmol/L 103  CO2 22 - 32 mmol/L 28  Calcium 8.9 - 10.3 mg/dL 9.7  Total Protein 6.5 - 8.1 g/dL 7.1  Total Bilirubin 0.3 - 1.2 mg/dL 0.7  Alkaline Phos 38 - 126 U/L 36(L)  AST 15 - 41 U/L 24  ALT 0 - 44 U/L 14   CBC Latest Ref Rng & Units 04/29/2019  WBC 4.0 - 10.5 K/uL 3.2(L)  Hemoglobin 12.0 - 15.0 g/dL 11.2(L)  Hematocrit 36.0 - 46.0 % 34.4(L)  Platelets 150 - 400 K/uL 159    LABORATORY DATA:  I have reviewed the data as listed Lab Results  Component Value Date   WBC 3.2 (L) 04/29/2019   HGB 11.2 (L) 04/29/2019   HCT 34.4 (L) 04/29/2019   MCV 97.2 04/29/2019   PLT  159 04/29/2019   Recent Labs    04/16/19 1246 04/23/19 0757 04/29/19 0822  NA 141 139 142  K 3.6 3.4* 3.5  CL 102 103 103  CO2 _0 GLUCOSE 121* 124* 112*  BUN 9 7* 9  CREATININE 1.17* 0.93 0.97  CALCIUM 9.4 9.3 9.7  GFRNONAA 47* >60 58*  GFRAA 54* >60 >60  PROT 7.2 6.8 7.1  ALBUMIN 3.9 3.9 4.2  AST _1 ALT _2 ALKPHOS 35* 38 36*  BILITOT 0.4 0.4 0.7    Iron/TIBC/Ferritin/ %Sat No results found for: IRON, TIBC, FERRITIN, IRONPCTSAT   RADIOGRAPHIC STUDIES: I have personally reviewed the radiological images as listed and agreed with the findings in the report. #11/19/2018 CT chest abdomen pelvis chest 1. Thickened endometrium with some irregularity compatible with the provided diagnosis of endometrial malignancy. There is a mildly prominent left inguinal lymph node at 1.4 cm in short axis which may reflect nodal spread. 2. Bilateral cystic renal lesions. We do not have precontrast images in order to be able to assigned Bosniak classifications. No significant degree of de-enhancement to favor solid mass. 3. Aortic Atherosclerosis (ICD10-I70.0). Coronary atherosclerosis. Sigmoid colon diverticulosis, with several areas of mild colon wall thickening probably from contraction. Correlation with the patient's colon cancer screening history is recommended. If screening is not up-to-date, appropriate screening should be considered. 4. Lumbar impingement at L3-4 and L4-5.5. Esophageal reflux versus dysmotility.  ASSESSMENT & PLAN:  1. Rectal cancer ([place])   2. Anemia due to antineoplastic chemotherapy   3. Chemotherapy induced neutropenia [facility])   Cancer Staging Rectal cancer [facility]) Staging form: Colon and Rectum, AJCC 8th Edition - Clinical stage from 01/23/2019: Stage IIIC (cT4b, cN1a, cM0) - Signed by Earlie Server, MD on 01/23/2019 #Rectal cancer Labs reviewed and discussed with patient .  Chemotherapy-induced neutropenia, ANC improved today. No need for G-CSF support.  Diarrhea due to chemotherapy and laxatives. Advised patient to be careful about using laxatives. Elevated creatinine, patient has improved oral hydration and creatinine has been stable. Hypokalemia, potassium is 3.5 today continue oral potassium chloride 20 mEq daily. #Family history with half brother having's history of colon cancer prostate cancer.  Personal history of colorectal cancer.  We discussed about genetic testing. Patient has not decided and she will update me if she is interested.  Follow up in 1 week   Earlie Server, MD, PhD

## 2019-05-07 ENCOUNTER — Other Ambulatory Visit: Payer: Self-pay

## 2019-05-07 ENCOUNTER — Encounter: Payer: Self-pay | Admitting: Oncology

## 2019-05-07 ENCOUNTER — Inpatient Hospital Stay (HOSPITAL_BASED_OUTPATIENT_CLINIC_OR_DEPARTMENT_OTHER): Payer: Medicare Other | Admitting: Oncology

## 2019-05-07 ENCOUNTER — Inpatient Hospital Stay: Payer: Medicare Other

## 2019-05-07 VITALS — BP 125/78 | HR 78 | Temp 96.5°F | Resp 18 | Wt 207.6 lb

## 2019-05-07 DIAGNOSIS — Z5111 Encounter for antineoplastic chemotherapy: Secondary | ICD-10-CM

## 2019-05-07 DIAGNOSIS — T451X5A Adverse effect of antineoplastic and immunosuppressive drugs, initial encounter: Secondary | ICD-10-CM | POA: Insufficient documentation

## 2019-05-07 DIAGNOSIS — C20 Malignant neoplasm of rectum: Secondary | ICD-10-CM | POA: Diagnosis not present

## 2019-05-07 DIAGNOSIS — D701 Agranulocytosis secondary to cancer chemotherapy: Secondary | ICD-10-CM | POA: Insufficient documentation

## 2019-05-07 DIAGNOSIS — R634 Abnormal weight loss: Secondary | ICD-10-CM

## 2019-05-07 DIAGNOSIS — E876 Hypokalemia: Secondary | ICD-10-CM

## 2019-05-07 DIAGNOSIS — D6481 Anemia due to antineoplastic chemotherapy: Secondary | ICD-10-CM | POA: Diagnosis not present

## 2019-05-07 LAB — COMPREHENSIVE METABOLIC PANEL
ALT: 13 U/L (ref 0–44)
AST: 25 U/L (ref 15–41)
Albumin: 3.9 g/dL (ref 3.5–5.0)
Alkaline Phosphatase: 37 U/L — ABNORMAL LOW (ref 38–126)
Anion gap: 9 (ref 5–15)
BUN: 10 mg/dL (ref 8–23)
CO2: 27 mmol/L (ref 22–32)
Calcium: 9.5 mg/dL (ref 8.9–10.3)
Chloride: 104 mmol/L (ref 98–111)
Creatinine, Ser: 0.85 mg/dL (ref 0.44–1.00)
GFR calc Af Amer: >60 mL/min (ref >60)
GFR calc non Af Amer: >60 mL/min (ref >60)
Glucose, Bld: 105 mg/dL — ABNORMAL HIGH (ref 70–99)
Potassium: 3.4 mmol/L — ABNORMAL LOW (ref 3.5–5.1)
Sodium: 140 mmol/L (ref 135–145)
Total Bilirubin: 0.6 mg/dL (ref 0.3–1.2)
Total Protein: 7.2 g/dL (ref 6.5–8.1)

## 2019-05-07 LAB — CBC WITH DIFFERENTIAL/PLATELET
Abs Immature Granulocytes: 0.01 10*3/uL (ref 0.00–0.07)
Basophils Absolute: 0 10*3/uL (ref 0.0–0.1)
Basophils Relative: 0 %
Eosinophils Absolute: 0.1 10*3/uL (ref 0.0–0.5)
Eosinophils Relative: 2 %
HCT: 33.1 % — ABNORMAL LOW (ref 36.0–46.0)
Hemoglobin: 10.7 g/dL — ABNORMAL LOW (ref 12.0–15.0)
Immature Granulocytes: 0 %
Lymphocytes Relative: 30 %
Lymphs Abs: 0.9 10*3/uL (ref 0.7–4.0)
MCH: 30.9 pg (ref 26.0–34.0)
MCHC: 32.3 g/dL (ref 30.0–36.0)
MCV: 95.7 fL (ref 80.0–100.0)
Monocytes Absolute: 0.4 10*3/uL (ref 0.1–1.0)
Monocytes Relative: 14 %
Neutro Abs: 1.6 10*3/uL — ABNORMAL LOW (ref 1.7–7.7)
Neutrophils Relative %: 54 %
Platelets: 164 10*3/uL (ref 150–400)
RBC: 3.46 MIL/uL — ABNORMAL LOW (ref 3.87–5.11)
RDW: 15.9 % — ABNORMAL HIGH (ref 11.5–15.5)
WBC: 3 10*3/uL — ABNORMAL LOW (ref 4.0–10.5)
nRBC: 0 % (ref 0.0–0.2)

## 2019-05-07 MED ORDER — SODIUM CHLORIDE 0.9 % IV SOLN
2400.0000 mg/m2 | INTRAVENOUS | Status: DC
  Administered 2019-05-07: 5000 mg via INTRAVENOUS
  Filled 2019-05-07: qty 100

## 2019-05-07 MED ORDER — PALONOSETRON HCL INJECTION 0.25 MG/5ML
0.2500 mg | Freq: Once | INTRAVENOUS | Status: AC
  Administered 2019-05-07: 10:00:00 0.25 mg via INTRAVENOUS
  Filled 2019-05-07: qty 5

## 2019-05-07 MED ORDER — DEXAMETHASONE SODIUM PHOSPHATE 10 MG/ML IJ SOLN
10.0000 mg | Freq: Once | INTRAMUSCULAR | Status: AC
  Administered 2019-05-07: 10:00:00 10 mg via INTRAVENOUS
  Filled 2019-05-07: qty 1

## 2019-05-07 MED ORDER — HEPARIN SOD (PORK) LOCK FLUSH 100 UNIT/ML IV SOLN
500.0000 [IU] | Freq: Once | INTRAVENOUS | Status: DC
  Filled 2019-05-07: qty 5

## 2019-05-07 MED ORDER — SODIUM CHLORIDE 0.9% FLUSH
10.0000 mL | Freq: Once | INTRAVENOUS | Status: AC
  Administered 2019-05-07: 09:00:00 10 mL via INTRAVENOUS
  Filled 2019-05-07: qty 10

## 2019-05-07 MED ORDER — DEXTROSE 5 % IV SOLN
Freq: Once | INTRAVENOUS | Status: AC
  Administered 2019-05-07: 10:00:00 via INTRAVENOUS
  Filled 2019-05-07: qty 250

## 2019-05-07 MED ORDER — OXALIPLATIN CHEMO INJECTION 100 MG/20ML
72.0000 mg/m2 | Freq: Once | INTRAVENOUS | Status: AC
  Administered 2019-05-07: 11:00:00 150 mg via INTRAVENOUS
  Filled 2019-05-07: qty 10

## 2019-05-07 MED ORDER — DEXTROSE 5 % IV SOLN
INTRAVENOUS | Status: DC
  Filled 2019-05-07: qty 250

## 2019-05-07 MED ORDER — LEUCOVORIN CALCIUM INJECTION 350 MG
408.6000 mg/m2 | Freq: Once | INTRAVENOUS | Status: AC
  Administered 2019-05-07: 850 mg via INTRAVENOUS
  Filled 2019-05-07: qty 25

## 2019-05-07 NOTE — Progress Notes (Signed)
Pt in for follow up denies any concerns.  Reports bowels "a little loose today".

## 2019-05-07 NOTE — Progress Notes (Signed)
Hematology/Oncology progress note [facility] Telephone:([phone] [phone]   Patient Care Team: Kirk Ruths, MD as PCP - General (Internal Medicine) Clent Jacks, RN as Registered Nurse  REFERRING PROVIDER: Dr.Secord CHIEF COMPLAINTS/REASON FOR VISIT:  Evaluation of endometrial cancer  HISTORY OF PRESENTING ILLNESS:  Jacqueline Yoder is a  73 y.o.  female with PMH listed below who was referred to me for evaluation of endometrial cancer.   Patient initially presented with complaints of postmenopausal bleeding on 08/16/2018.  History of was menopausal vaginal bleeding in 2016 which resulted in cervical polypectomy.  Pathology 02/04/2015 showed cervical polyp, consistent with benign endometrial polyp.  [name] after polypectomy due to anxiety associated with pelvic exams.  pelvic exam on 08/16/2018 reviewed cervical abnormality and from enlarged uterus. Seen by Dr. Marcelline Mates on 10/29/2018.  Endometrial biopsy and a Pap smear was performed. 10/29/2018 Pap smear showed adenocarcinoma, favor endometrial origin. 10/29/2018 endometrial biopsy showed endometrioid carcinoma, FIGO grade 1.  10/29/2018- TA & TV Ultrasound revealed: Anteverted uterus measuring 8.7 x 5.6 x 6.4 cm without evidence of focal masses.  The endometrium measuring 24.1 mm (thickened) and heterogeneous.  Right and left ovaries not visualized.  No adnexal masses identified.  No free fluid in cul-de-sac.  Patient was seen by Dr. Theora Gianotti in clinic on 11/13/2018.  Cervical exam reveals 2 cm exophytic irregular mass consistent with malignancy.   11/19/2018 CT chest abdomen pelvis with contrast showed thickened endometrium with some irregularity compatible with the provided diagnosis of endometrial malignancy.  There is a mildly prominent left inguinal node 1.4 cm.  Patient was seen by Dr. Fransisca Connors on 11/20/2018 and left groin lymph node biopsy was recommended.  11/26/2018 patient  underwent left inguinal lymph node biopsy. Pathology showed metastatic adenocarcinoma consistent with colorectal origin.  CDX 2+.  Case was discussed on tumor board.  Recommend colonoscopy for further evaluation.  Patient reports significant weight loss 30 pounds over the last year.  Chronic vaginal spotting. Change of bowel habits the past few months.  More constipated.  Family history positive for brother who has colon cancer prostate cancer.  patient has underwent colonoscopy on 12/03/2018 which reviewed a nonobstructing large mass in the rectum.  Also chronic fistula.  Mass was not circumferential.  This was biopsied with a cold forceps for histology.  Pathology came back hyperplastic polyp negative for dysplasia and malignancy. [name], patient underwent flex sigmoidoscopy on 12/06/2018 with rebiopsy of the rectal mass. This time biopsy results came back positive for invasive colorectal adenocarcinoma, moderately differentiated. Immunotherapy for nearly mismatch repair protein (MMR ) was performed.  There is no loss of MMR expression.  low probability of MSI high.   # Seen by [name] for evaluation of resectability for rectal cancer. In addition, she also had a second opinion with [facility] where her endometrial biopsy pathology was changed to  adenocarcinoma, consistent with colorectal primary.   [name]. She has home health that has been assisting with ostomy care  Patient was also evaluated by [facility] oncology.  Recommendation is to proceed with TNT with concurrent chemoradiation followed by neoadjuvant chemotherapy followed by surgical resection. Patient prefers to have treatment done locally with [facility].   # Oncology Treatment:  02/03/2019- 03/19/2019  concurrent Xeloda and radiation.  Xeloda dose 857m /m2 BID - rounded to 16564mBID- on days of radiation. 04/09/2019, started on FOLFOX INTERVAL HISTORY Jacqueline ARATAis a  73 y.o. female who has above history reviewed by me today presents for evaluation after neoadjuvant chemotherapy FOLFOX for treatment of rectal cancer. Status post 3 cycles of FOLFOX. She reports having diarrhea recently, she has not used imodium yet.  Denies any pain.  Appetite is fair. Lost 2 pounds since last visit.  Chronic fatigue at baseline.  Denies any numbness or tingling. No nausea or vomiting.    Review of Systems  Constitutional: Positive for fatigue. Negative for appetite change, chills, fever and unexpected weight change.  HENT:   Negative for hearing loss and voice change.   Eyes: Negative for eye problems.  Respiratory: Negative for chest tightness and cough.   Cardiovascular: Negative for chest pain.  Gastrointestinal: Positive for diarrhea. Negative for abdominal distention, abdominal pain, blood in stool, constipation and nausea.  Endocrine: Negative for hot flashes.  Genitourinary: Negative for difficulty urinating and frequency.   Musculoskeletal: Negative for arthralgias.  Skin: Negative for itching and rash.  Neurological: Negative for extremity weakness.  Hematological: Negative for adenopathy.  Psychiatric/Behavioral: Negative for confusion.    MEDICAL HISTORY:  Past Medical History:  Diagnosis Date   Arthritis    Cancer ([place])    rectal   GERD (gastroesophageal reflux disease)    Hypercholesteremia    Hypertension    Hypertension    Lower extremity edema    Urinary incontinence     SURGICAL HISTORY: Past Surgical History:  Procedure Laterality Date   CHOLECYSTECTOMY  1971   COLONOSCOPY WITH PROPOFOL N/A 12/03/2018   Procedure: COLONOSCOPY WITH PROPOFOL;  Surgeon: Lucilla Lame, MD;  Location: [facility] ENDOSCOPY;  Service: Endoscopy;  Laterality: N/A;   FLEXIBLE SIGMOIDOSCOPY N/A 12/06/2018   Procedure: FLEXIBLE SIGMOIDOSCOPY;  Surgeon: [name], MD;  Location: [facility] ENDOSCOPY;  Service: Endoscopy;  Laterality: N/A;   LAPAROSCOPIC  COLOSTOMY  01/06/2019   PORTACATH PLACEMENT N/A 04/03/2019   Procedure: INSERTION PORT-A-CATH;  Surgeon: Jules Husbands, MD;  Location: [facility] ORS;  Service: General;  Laterality: N/A;    SOCIAL HISTORY: Social History   Socioeconomic History   Marital status: Married    Spouse name: Not on file   Number of children: Not on file   Years of education: Not on file   Highest education level: Not on file  Occupational History   Not on file  Social Needs   Financial resource strain: Not on file   Food insecurity    Worry: Not on file    Inability: Not on file   Transportation needs    Medical: Not on file    Non-medical: Not on file  Tobacco Use   Smoking status: Former Smoker    Quit date: 12/02/1977    Years since quitting: 41.4   Smokeless tobacco: Never Used  Substance and Sexual Activity   Alcohol use: Never    Frequency: Never   Drug use: Never   Sexual activity: Not Currently    Birth control/protection: None  Lifestyle   Physical activity    Days per week: Not on file    Minutes per session: Not on file   Stress: Not on file  Relationships   Social connections    Talks on phone: Not on file    Gets together: Not on file    Attends religious service: Not on file    Active member of club or organization: Not on file    Attends meetings of clubs or organizations: Not on file    Relationship status: Not on file  Intimate partner violence    Fear of current or ex partner: Not on file    Emotionally abused: Not on file    Physically abused: Not on file    Forced sexual activity: Not on file  Other Topics Concern   Not on file  Social History Narrative   Not on file    FAMILY HISTORY: Family History  Problem Relation Age of Onset   Colon cancer Brother    Prostate cancer Brother    Hypertension Mother    Stroke Mother    Kidney failure Father    Breast cancer Neg Hx    Ovarian cancer Neg Hx     ALLERGIES:  is allergic to  sulfamethoxazole-trimethoprim.  MEDICATIONS:  Current Outpatient Medications  Medication Sig Dispense Refill   chlorhexidine (PERIDEX) 0.12 % solution Use as directed 15 mLs in the mouth or throat 2 (two) times daily. 473 mL 3   Cholecalciferol (VITAMIN D3) 2000 units capsule Take 2,000 Units by mouth daily.     diclofenac sodium (VOLTAREN) 1 % GEL Apply 2 g topically 4 (four) times daily as needed (joint pain).   11   fexofenadine (ALLEGRA) 180 MG tablet Take 180 mg by mouth daily.     fluticasone (FLONASE) 50 MCG/ACT nasal spray Place 1 spray into both nostrils 2 (two) times a day.      HYDROcodone-acetaminophen (NORCO/VICODIN) 5-325 MG tablet Take 1 tablet by mouth every 4 (four) hours as needed for moderate pain. 8 tablet 0   ketorolac (ACULAR) 0.4 % SOLN Place 1 drop into both eyes 2 (two) times daily.      lidocaine-prilocaine (EMLA) cream Apply 1 application topically as needed. 30 g 6   lidocaine-prilocaine (EMLA) cream Apply to affected area once 30 g 4   loperamide (IMODIUM) 2 MG capsule Take 1 capsule (2 mg total) by mouth See admin instructions. With onset of loose stool, take 2m followed by 213mevery 2 hours,  Maximum: 16 mg/day 120 capsule 1   Multiple Vitamins-Minerals (ONE-A-DAY WOMENS 50 PLUS PO) Take 1 tablet by mouth daily.      ondansetron (ZOFRAN) 8 MG tablet Take 1 tablet (8 mg total) by mouth every 8 (eight) hours as needed for nausea, vomiting or refractory nausea / vomiting. 90 tablet 1   pantoprazole (PROTONIX) 40 MG tablet Take 1 tablet (40 mg total) by mouth as needed. (Patient taking differently: Take 40 mg by mouth daily as needed (heartburn). ) 30 tablet 0   potassium chloride SA (K-DUR) 20 MEQ tablet Take 1 tablet (20 mEq total) by mouth daily. 14 tablet 0   prednisoLONE acetate (PRED FORTE) 1 % ophthalmic suspension Place 1 drop into both eyes 2 (two) times a day.   1   prochlorperazine (COMPAZINE) 10 MG tablet Take 1 tablet (10 mg total) by mouth  every 6 (six) hours as needed (Nausea or vomiting). 30 tablet 1   simvastatin (ZOCOR) 40 MG tablet Take 40 mg by mouth at bedtime.      triamterene-hydrochlorothiazide (DYAZIDE) 37.5-25 MG capsule Take 1 capsule by mouth daily.      Lutein 40 MG CAPS Take 40 mg by mouth daily.      Lutein-Bilberry (BILBERRY PLUS LUTEIN) 02-999 MCG-MG CAPS Take 1 capsule by mouth daily.      nystatin (MYCOSTATIN/NYSTOP) powder Apply topically 4 (four) times daily. (Patient not taking: Reported on 05/07/2019) 30 g 0   No current facility-administered medications for this visit.      PHYSICAL  EXAMINATION: ECOG PERFORMANCE STATUS: 1 - Symptomatic but completely ambulatory Vitals:   05/07/19 0941  BP: 125/78  Pulse: 78  Resp: 18  Temp: (!) 96.5 F (35.8 C)   Filed Weights   05/07/19 0941  Weight: 207 lb 9.6 oz (94.2 kg)    Physical Exam Constitutional:      General: She is not in acute distress. HENT:     Head: Normocephalic and atraumatic.  Eyes:     General: No scleral icterus.    Pupils: Pupils are equal, round, and reactive to light.  Neck:     Musculoskeletal: Normal range of motion and neck supple.  Cardiovascular:     Rate and Rhythm: Normal rate and regular rhythm.     Heart sounds: Normal heart sounds.  Pulmonary:     Effort: Pulmonary effort is normal. No respiratory distress.     Breath sounds: No wheezing.  Abdominal:     General: Bowel sounds are normal. There is no distension.     Palpations: Abdomen is soft. There is no mass.     Tenderness: There is no abdominal tenderness.     Comments: + colostomy bag with small amount of yellow color loose stool.   Musculoskeletal: Normal range of motion.        General: No deformity.  Skin:    General: Skin is warm and dry.     Findings: No erythema or rash.  Neurological:     Mental Status: She is alert and oriented to person, place, and time.     Cranial Nerves: No cranial nerve deficit.     Coordination: Coordination  normal.  Psychiatric:        Behavior: Behavior normal.        Thought Content: Thought content normal.     RADIOGRAPHIC STUDIES: I have personally reviewed the radiological images as listed and agreed with the findings in the report.  CMP Latest Ref Rng & Units 05/07/2019  Glucose 70 - 99 mg/dL 105(H)  BUN 8 - 23 mg/dL 10  Creatinine 0.44 - 1.00 mg/dL 0.85  Sodium 135 - 145 mmol/L 140  Potassium 3.5 - 5.1 mmol/L 3.4(L)  Chloride 98 - 111 mmol/L 104  CO2 22 - 32 mmol/L 27  Calcium 8.9 - 10.3 mg/dL 9.5  Total Protein 6.5 - 8.1 g/dL 7.2  Total Bilirubin 0.3 - 1.2 mg/dL 0.6  Alkaline Phos 38 - 126 U/L 37(L)  AST 15 - 41 U/L 25  ALT 0 - 44 U/L 13   CBC Latest Ref Rng & Units 05/07/2019  WBC 4.0 - 10.5 K/uL 3.0(L)  Hemoglobin 12.0 - 15.0 g/dL 10.7(L)  Hematocrit 36.0 - 46.0 % 33.1(L)  Platelets 150 - 400 K/uL 164    LABORATORY DATA:  I have reviewed the data as listed Lab Results  Component Value Date   WBC 3.0 (L) 05/07/2019   HGB 10.7 (L) 05/07/2019   HCT 33.1 (L) 05/07/2019   MCV 95.7 05/07/2019   PLT 164 05/07/2019   Recent Labs    04/23/19 0757 04/29/19 0822 05/07/19 0930  NA 139 142 140  K 3.4* 3.5 3.4*  CL 103 103 104  CO2 _0 GLUCOSE 124* 112* 105*  BUN 7* 9 10  CREATININE 0.93 0.97 0.85  CALCIUM 9.3 9.7 9.5  GFRNONAA >60 58* >60  GFRAA >60 >60 >60  PROT 6.8 7.1 7.2  ALBUMIN 3.9 4.2 3.9  AST _1 ALT _2 ALKPHOS 38  36* 37*  BILITOT 0.4 0.7 0.6   Iron/TIBC/Ferritin/ %Sat No results found for: IRON, TIBC, FERRITIN, IRONPCTSAT   RADIOGRAPHIC STUDIES: I have personally reviewed the radiological images as listed and agreed with the findings in the report. #11/19/2018 CT chest abdomen pelvis chest 1. Thickened endometrium with some irregularity compatible with the provided diagnosis of endometrial malignancy. There is a mildly prominent left inguinal lymph node at 1.4 cm in short axis which may reflect nodal spread. 2. Bilateral cystic  renal lesions. We do not have precontrast images in order to be able to assigned Bosniak classifications. No significant degree of de-enhancement to favor solid mass. 3. Aortic Atherosclerosis (ICD10-I70.0). Coronary atherosclerosis. Sigmoid colon diverticulosis, with several areas of mild colon wall thickening probably from contraction. Correlation with the patient's colon cancer screening history is recommended. If screening is not up-to-date, appropriate screening should be considered. 4. Lumbar impingement at L3-4 and L4-5.5. Esophageal reflux versus dysmotility.  ASSESSMENT & PLAN:  1. Rectal cancer ([place])   2. Chemotherapy induced neutropenia ([facility])   3. Anemia due to antineoplastic chemotherapy   4. Encounter for antineoplastic chemotherapy   5. Loss of weight   6. Hypokalemia   Cancer Staging Rectal cancer [facility]) Staging form: Colon and Rectum, AJCC 8th Edition - Clinical stage from 01/23/2019: Stage IIIC (cT4b, cN1a, cM0) - Signed by Earlie Server, MD on 01/23/2019 #Rectal cancer Labs are reviewed and discussed with patient. Overall tolerating chemotherapy well. Chemotherapy-induced neutropenia, ANC 1.6. Counts acceptable to proceed with FOLFOX today.  #Chemotherapy-induced diarrhea, recommend patient to start utilizing Imodium as instructed.  #Hypokalemia, secondary to chemotherapy and diarrhea. Recommend patient to take 40 mEq potassium chloride today, she will go back to potassium chloride 20 mEq daily.  # Weight loss, discussed about calorie intake.  Monitor weight.  Will refer to dietitian if persistent weight loss. #Anemia, hemoglobin 10.7.  Secondary to chemotherapy.  Close monitor.  #Family history with half brother having's history of colon cancer prostate cancer.  Personal history of colorectal cancer. We discussed about genetic testing. Patient has not decided and she will update me if she is interested.  Follow up in 2 weeks.   Earlie Server, MD, PhD

## 2019-05-08 ENCOUNTER — Other Ambulatory Visit: Payer: Self-pay

## 2019-05-09 ENCOUNTER — Other Ambulatory Visit: Payer: Self-pay

## 2019-05-09 ENCOUNTER — Inpatient Hospital Stay: Payer: Medicare Other

## 2019-05-09 VITALS — BP 149/77 | HR 82 | Resp 19

## 2019-05-09 DIAGNOSIS — C20 Malignant neoplasm of rectum: Secondary | ICD-10-CM | POA: Diagnosis not present

## 2019-05-09 MED ORDER — SODIUM CHLORIDE 0.9% FLUSH
10.0000 mL | INTRAVENOUS | Status: DC | PRN
  Administered 2019-05-09: 13:00:00 10 mL
  Filled 2019-05-09: qty 10

## 2019-05-09 MED ORDER — HEPARIN SOD (PORK) LOCK FLUSH 100 UNIT/ML IV SOLN
500.0000 [IU] | Freq: Once | INTRAVENOUS | Status: AC | PRN
  Administered 2019-05-09: 13:00:00 500 [IU]
  Filled 2019-05-09: qty 5

## 2019-05-21 ENCOUNTER — Inpatient Hospital Stay: Payer: Medicare Other

## 2019-05-21 ENCOUNTER — Inpatient Hospital Stay (HOSPITAL_BASED_OUTPATIENT_CLINIC_OR_DEPARTMENT_OTHER): Payer: Medicare Other | Admitting: Oncology

## 2019-05-21 ENCOUNTER — Inpatient Hospital Stay: Payer: Medicare Other | Attending: Oncology

## 2019-05-21 ENCOUNTER — Other Ambulatory Visit: Payer: Self-pay

## 2019-05-21 ENCOUNTER — Encounter: Payer: Self-pay | Admitting: Oncology

## 2019-05-21 VITALS — BP 123/76 | HR 78 | Temp 96.6°F | Resp 18 | Ht 65.0 in | Wt 207.6 lb

## 2019-05-21 DIAGNOSIS — K219 Gastro-esophageal reflux disease without esophagitis: Secondary | ICD-10-CM | POA: Diagnosis not present

## 2019-05-21 DIAGNOSIS — T451X5A Adverse effect of antineoplastic and immunosuppressive drugs, initial encounter: Secondary | ICD-10-CM | POA: Diagnosis not present

## 2019-05-21 DIAGNOSIS — Z8041 Family history of malignant neoplasm of ovary: Secondary | ICD-10-CM | POA: Insufficient documentation

## 2019-05-21 DIAGNOSIS — Z95828 Presence of other vascular implants and grafts: Secondary | ICD-10-CM

## 2019-05-21 DIAGNOSIS — E7800 Pure hypercholesterolemia, unspecified: Secondary | ICD-10-CM | POA: Diagnosis not present

## 2019-05-21 DIAGNOSIS — E876 Hypokalemia: Secondary | ICD-10-CM | POA: Insufficient documentation

## 2019-05-21 DIAGNOSIS — H1131 Conjunctival hemorrhage, right eye: Secondary | ICD-10-CM

## 2019-05-21 DIAGNOSIS — K5900 Constipation, unspecified: Secondary | ICD-10-CM | POA: Diagnosis not present

## 2019-05-21 DIAGNOSIS — Z5111 Encounter for antineoplastic chemotherapy: Secondary | ICD-10-CM | POA: Diagnosis not present

## 2019-05-21 DIAGNOSIS — E669 Obesity, unspecified: Secondary | ICD-10-CM | POA: Insufficient documentation

## 2019-05-21 DIAGNOSIS — C774 Secondary and unspecified malignant neoplasm of inguinal and lower limb lymph nodes: Secondary | ICD-10-CM | POA: Diagnosis not present

## 2019-05-21 DIAGNOSIS — D701 Agranulocytosis secondary to cancer chemotherapy: Secondary | ICD-10-CM

## 2019-05-21 DIAGNOSIS — Z87891 Personal history of nicotine dependence: Secondary | ICD-10-CM | POA: Insufficient documentation

## 2019-05-21 DIAGNOSIS — D6481 Anemia due to antineoplastic chemotherapy: Secondary | ICD-10-CM

## 2019-05-21 DIAGNOSIS — Z79899 Other long term (current) drug therapy: Secondary | ICD-10-CM | POA: Insufficient documentation

## 2019-05-21 DIAGNOSIS — C20 Malignant neoplasm of rectum: Secondary | ICD-10-CM

## 2019-05-21 DIAGNOSIS — I10 Essential (primary) hypertension: Secondary | ICD-10-CM | POA: Insufficient documentation

## 2019-05-21 DIAGNOSIS — Z800 Family history of malignant neoplasm of digestive organs: Secondary | ICD-10-CM | POA: Insufficient documentation

## 2019-05-21 DIAGNOSIS — M129 Arthropathy, unspecified: Secondary | ICD-10-CM | POA: Diagnosis not present

## 2019-05-21 DIAGNOSIS — D6959 Other secondary thrombocytopenia: Secondary | ICD-10-CM | POA: Diagnosis not present

## 2019-05-21 DIAGNOSIS — R634 Abnormal weight loss: Secondary | ICD-10-CM | POA: Diagnosis not present

## 2019-05-21 LAB — COMPREHENSIVE METABOLIC PANEL
ALT: 12 U/L (ref 0–44)
AST: 28 U/L (ref 15–41)
Albumin: 4.1 g/dL (ref 3.5–5.0)
Alkaline Phosphatase: 36 U/L — ABNORMAL LOW (ref 38–126)
Anion gap: 9 (ref 5–15)
BUN: 7 mg/dL — ABNORMAL LOW (ref 8–23)
CO2: 26 mmol/L (ref 22–32)
Calcium: 9.5 mg/dL (ref 8.9–10.3)
Chloride: 105 mmol/L (ref 98–111)
Creatinine, Ser: 1 mg/dL (ref 0.44–1.00)
GFR calc Af Amer: >60 mL/min (ref >60)
GFR calc non Af Amer: 56 mL/min — ABNORMAL LOW (ref >60)
Glucose, Bld: 115 mg/dL — ABNORMAL HIGH (ref 70–99)
Potassium: 3.5 mmol/L (ref 3.5–5.1)
Sodium: 140 mmol/L (ref 135–145)
Total Bilirubin: 0.5 mg/dL (ref 0.3–1.2)
Total Protein: 7 g/dL (ref 6.5–8.1)

## 2019-05-21 LAB — CBC WITH DIFFERENTIAL/PLATELET
Abs Immature Granulocytes: 0 10*3/uL (ref 0.00–0.07)
Basophils Absolute: 0 10*3/uL (ref 0.0–0.1)
Basophils Relative: 0 %
Eosinophils Absolute: 0.1 10*3/uL (ref 0.0–0.5)
Eosinophils Relative: 3 %
HCT: 33.5 % — ABNORMAL LOW (ref 36.0–46.0)
Hemoglobin: 10.8 g/dL — ABNORMAL LOW (ref 12.0–15.0)
Immature Granulocytes: 0 %
Lymphocytes Relative: 38 %
Lymphs Abs: 1 10*3/uL (ref 0.7–4.0)
MCH: 30.9 pg (ref 26.0–34.0)
MCHC: 32.2 g/dL (ref 30.0–36.0)
MCV: 96 fL (ref 80.0–100.0)
Monocytes Absolute: 0.4 10*3/uL (ref 0.1–1.0)
Monocytes Relative: 16 %
Neutro Abs: 1.2 10*3/uL — ABNORMAL LOW (ref 1.7–7.7)
Neutrophils Relative %: 43 %
Platelets: 130 10*3/uL — ABNORMAL LOW (ref 150–400)
RBC: 3.49 MIL/uL — ABNORMAL LOW (ref 3.87–5.11)
RDW: 15.4 % (ref 11.5–15.5)
WBC: 2.7 10*3/uL — ABNORMAL LOW (ref 4.0–10.5)
nRBC: 0 % (ref 0.0–0.2)

## 2019-05-21 MED ORDER — SODIUM CHLORIDE 0.9% FLUSH
10.0000 mL | Freq: Once | INTRAVENOUS | Status: AC
  Administered 2019-05-21: 10 mL via INTRAVENOUS
  Filled 2019-05-21: qty 10

## 2019-05-21 MED ORDER — SODIUM CHLORIDE 0.9 % IV SOLN
2400.0000 mg/m2 | INTRAVENOUS | Status: DC
  Administered 2019-05-21: 5000 mg via INTRAVENOUS
  Filled 2019-05-21: qty 100

## 2019-05-21 MED ORDER — OXALIPLATIN CHEMO INJECTION 100 MG/20ML
72.0000 mg/m2 | Freq: Once | INTRAVENOUS | Status: AC
  Administered 2019-05-21: 150 mg via INTRAVENOUS
  Filled 2019-05-21: qty 20

## 2019-05-21 MED ORDER — PALONOSETRON HCL INJECTION 0.25 MG/5ML
0.2500 mg | Freq: Once | INTRAVENOUS | Status: AC
  Administered 2019-05-21: 0.25 mg via INTRAVENOUS
  Filled 2019-05-21: qty 5

## 2019-05-21 MED ORDER — DEXTROSE 5 % IV SOLN
Freq: Once | INTRAVENOUS | Status: AC
  Administered 2019-05-21: 09:00:00 via INTRAVENOUS
  Filled 2019-05-21: qty 250

## 2019-05-21 MED ORDER — DEXAMETHASONE SODIUM PHOSPHATE 10 MG/ML IJ SOLN
10.0000 mg | Freq: Once | INTRAMUSCULAR | Status: AC
  Administered 2019-05-21: 10 mg via INTRAVENOUS
  Filled 2019-05-21: qty 1

## 2019-05-21 MED ORDER — LEUCOVORIN CALCIUM INJECTION 350 MG
850.0000 mg | Freq: Once | INTRAVENOUS | Status: AC
  Administered 2019-05-21: 850 mg via INTRAVENOUS
  Filled 2019-05-21: qty 25

## 2019-05-21 NOTE — Progress Notes (Signed)
Hematology/Oncology progress note [facility] [phone] [phone]   Patient Care Team: Kirk Ruths, MD as PCP - General (Internal Medicine) Clent Jacks, RN as Registered Nurse  REFERRING PROVIDER: Dr.Secord CHIEF COMPLAINTS/REASON FOR VISIT:  Evaluation of endometrial cancer  HISTORY OF PRESENTING ILLNESS:  Jacqueline Yoder is a  73 y.o.  female with PMH listed below who was referred to me for evaluation of endometrial cancer.   Patient initially presented with complaints of postmenopausal bleeding on 08/16/2018.  History of was menopausal vaginal bleeding in 2016 which resulted in cervical polypectomy.  Pathology 02/04/2015 showed cervical polyp, consistent with benign endometrial polyp.  [name] after polypectomy due to anxiety associated with pelvic exams.  pelvic exam on 08/16/2018 reviewed cervical abnormality and from enlarged uterus. Seen by Dr. Marcelline Mates on 10/29/2018.  Endometrial biopsy and a Pap smear was performed. 10/29/2018 Pap smear showed adenocarcinoma, favor endometrial origin. 10/29/2018 endometrial biopsy showed endometrioid carcinoma, FIGO grade 1.  10/29/2018- TA & TV Ultrasound revealed: Anteverted uterus measuring 8.7 x 5.6 x 6.4 cm without evidence of focal masses.  The endometrium measuring 24.1 mm (thickened) and heterogeneous.  Right and left ovaries not visualized.  No adnexal masses identified.  No free fluid in cul-de-sac.  Patient was seen by Dr. Theora Gianotti in clinic on 11/13/2018.  Cervical exam reveals 2 cm exophytic irregular mass consistent with malignancy.   11/19/2018 CT chest abdomen pelvis with contrast showed thickened endometrium with some irregularity compatible with the provided diagnosis of endometrial malignancy.  There is a mildly prominent left inguinal node 1.4 cm.  Patient was seen by Dr. Fransisca Connors on 11/20/2018 and left groin lymph node biopsy was recommended.  11/26/2018 patient  underwent left inguinal lymph node biopsy. Pathology showed metastatic adenocarcinoma consistent with colorectal origin.  CDX 2+.  Case was discussed on tumor board.  Recommend colonoscopy for further evaluation.  Patient reports significant weight loss 30 pounds over the last year.  Chronic vaginal spotting. Change of bowel habits the past few months.  More constipated.  Family history positive for brother who has colon cancer prostate cancer.  patient has underwent colonoscopy on 12/03/2018 which reviewed a nonobstructing large mass in the rectum.  Also chronic fistula.  Mass was not circumferential.  This was biopsied with a cold forceps for histology.  Pathology came back hyperplastic polyp negative for dysplasia and malignancy. [name], patient underwent flex sigmoidoscopy on 12/06/2018 with rebiopsy of the rectal mass. This time biopsy results came back positive for invasive colorectal adenocarcinoma, moderately differentiated. Immunotherapy for nearly mismatch repair protein (MMR ) was performed.  There is no loss of MMR expression.  low probability of MSI high.   # Seen by [name] for evaluation of resectability for rectal cancer. In addition, she also had a second opinion with [facility] where her endometrial biopsy pathology was changed to  adenocarcinoma, consistent with colorectal primary.   [name]. She has home health that has been assisting with ostomy care  Patient was also evaluated by [facility] oncology.  Recommendation is to proceed with TNT with concurrent chemoradiation followed by neoadjuvant chemotherapy followed by surgical resection. Patient prefers to have treatment done locally with [facility].   # Oncology Treatment:  02/03/2019- 03/19/2019  concurrent Xeloda and radiation.  Xeloda dose 874m /m2 BID - rounded to 16571mBID- on days of radiation. 04/09/2019, started on FOLFOX with bolus 5-FU omitted.    INTERVAL HISTORY  Jacqueline Yoder is a 73 y.o. female who has above history reviewed by me today presents for evaluation after neoadjuvant chemotherapy FOLFOX for treatment of rectal cancer. became I am in the dorsal medial for quick Status post 3 cycles of FOLFOX.  Patient reports doing well. She takes fiber and Colace as needed combination and it has worked quite well for her constipation. Reports that her right eye turned red, no itchy or discharge.  Denies any nausea, vomiting, fever or chills. Her appetite is fair.  Weight has been stable since last visit.   Review of Systems  Constitutional: Positive for fatigue. Negative for appetite change, chills, fever and unexpected weight change.  HENT:   Negative for hearing loss and voice change.   Eyes: Negative for eye problems.       Right eye turned red  Respiratory: Negative for chest tightness and cough.   Cardiovascular: Negative for chest pain.  Gastrointestinal: Negative for abdominal distention, abdominal pain, blood in stool, constipation, diarrhea and nausea.  Endocrine: Negative for hot flashes.  Genitourinary: Negative for difficulty urinating and frequency.   Musculoskeletal: Negative for arthralgias.  Skin: Negative for itching and rash.  Neurological: Negative for extremity weakness.  Hematological: Negative for adenopathy.  Psychiatric/Behavioral: Negative for confusion.    MEDICAL HISTORY:  Past Medical History:  Diagnosis Date  . Arthritis   . Cancer ([place])    rectal  . GERD (gastroesophageal reflux disease)   . Hypercholesteremia   . Hypertension   . Hypertension   . Lower extremity edema   . Urinary incontinence     SURGICAL HISTORY: Past Surgical History:  Procedure Laterality Date  . CHOLECYSTECTOMY  1971  . COLONOSCOPY WITH PROPOFOL N/A 12/03/2018   Procedure: COLONOSCOPY WITH PROPOFOL;  Surgeon: Lucilla Lame, MD;  Location: [facility] ENDOSCOPY;  Service: Endoscopy;  Laterality: N/A;  . FLEXIBLE  SIGMOIDOSCOPY N/A 12/06/2018   Procedure: FLEXIBLE SIGMOIDOSCOPY;  Surgeon: [name], MD;  Location: [facility] ENDOSCOPY;  Service: Endoscopy;  Laterality: N/A;  . LAPAROSCOPIC COLOSTOMY  01/06/2019  . PORTACATH PLACEMENT N/A 04/03/2019   Procedure: INSERTION PORT-A-CATH;  Surgeon: Jules Husbands, MD;  Location: [facility] ORS;  Service: General;  Laterality: N/A;    SOCIAL HISTORY: Social History   Socioeconomic History  . Marital status: Married    Spouse name: Not on file  . Number of children: Not on file  . Years of education: Not on file  . Highest education level: Not on file  Occupational History  . Not on file  Social Needs  . Financial resource strain: Not on file  . Food insecurity    Worry: Not on file    Inability: Not on file  . Transportation needs    Medical: Not on file    Non-medical: Not on file  Tobacco Use  . Smoking status: Former Smoker    Quit date: 12/02/1977    Years since quitting: 41.4  . Smokeless tobacco: Never Used  Substance and Sexual Activity  . Alcohol use: Never    Frequency: Never  . Drug use: Never  . Sexual activity: Not Currently    Birth control/protection: None  Lifestyle  . Physical activity    Days per week: Not on file    Minutes per session: Not on file  . Stress: Not on file  Relationships  . Social Herbalist on phone: Not on file    Gets together: Not on file    Attends religious service: Not on  file    Active member of club or organization: Not on file    Attends meetings of clubs or organizations: Not on file    Relationship status: Not on file  . Intimate partner violence    Fear of current or ex partner: Not on file    Emotionally abused: Not on file    Physically abused: Not on file    Forced sexual activity: Not on file  Other Topics Concern  . Not on file  Social History Narrative  . Not on file    FAMILY HISTORY: Family History  Problem Relation Age of Onset  . Colon cancer Brother   . Prostate  cancer Brother   . Hypertension Mother   . Stroke Mother   . Kidney failure Father   . Breast cancer Neg Hx   . Ovarian cancer Neg Hx     ALLERGIES:  is allergic to sulfamethoxazole-trimethoprim.  MEDICATIONS:  Current Outpatient Medications  Medication Sig Dispense Refill  . chlorhexidine (PERIDEX) 0.12 % solution Use as directed 15 mLs in the mouth or throat 2 (two) times daily. 473 mL 3  . Cholecalciferol (VITAMIN D3) 2000 units capsule Take 2,000 Units by mouth daily.    . diclofenac sodium (VOLTAREN) 1 % GEL Apply 2 g topically 4 (four) times daily as needed (joint pain).   11  . docusate sodium (COLACE) 100 MG capsule Take 100 mg by mouth 2 (two) times daily.    . fexofenadine (ALLEGRA) 180 MG tablet Take 180 mg by mouth daily.    . fluticasone (FLONASE) 50 MCG/ACT nasal spray Place 1 spray into both nostrils 2 (two) times a day.     Marland Kitchen HYDROcodone-acetaminophen (NORCO/VICODIN) 5-325 MG tablet Take 1 tablet by mouth every 4 (four) hours as needed for moderate pain. 8 tablet 0  . ketorolac (ACULAR) 0.4 % SOLN Place 1 drop into both eyes 2 (two) times daily.     Marland Kitchen loperamide (IMODIUM) 2 MG capsule Take 1 capsule (2 mg total) by mouth See admin instructions. With onset of loose stool, take '4mg'$  followed by '2mg'$  every 2 hours,  Maximum: 16 mg/day 120 capsule 1  . Multiple Vitamins-Minerals (ONE-A-DAY WOMENS 50 PLUS PO) Take 1 tablet by mouth daily.     Marland Kitchen nystatin (MYCOSTATIN/NYSTOP) powder Apply topically 4 (four) times daily. 30 g 0  . ondansetron (ZOFRAN) 8 MG tablet Take 1 tablet (8 mg total) by mouth every 8 (eight) hours as needed for nausea, vomiting or refractory nausea / vomiting. 90 tablet 1  . pantoprazole (PROTONIX) 40 MG tablet Take 1 tablet (40 mg total) by mouth as needed. (Patient taking differently: Take 40 mg by mouth daily as needed (heartburn). ) 30 tablet 0  . potassium chloride SA (K-DUR) 20 MEQ tablet Take 1 tablet (20 mEq total) by mouth daily. 14 tablet 0  .  prednisoLONE acetate (PRED FORTE) 1 % ophthalmic suspension Place 1 drop into both eyes 2 (two) times a day.   1  . prochlorperazine (COMPAZINE) 10 MG tablet Take 1 tablet (10 mg total) by mouth every 6 (six) hours as needed (Nausea or vomiting). 30 tablet 1  . simvastatin (ZOCOR) 40 MG tablet Take 40 mg by mouth at bedtime.     . triamterene-hydrochlorothiazide (DYAZIDE) 37.5-25 MG capsule Take 1 capsule by mouth daily.     Marland Kitchen lidocaine-prilocaine (EMLA) cream Apply 1 application topically as needed. 30 g 6  . Lutein 40 MG CAPS Take 40 mg by mouth daily.     Marland Kitchen  Lutein-Bilberry (BILBERRY PLUS LUTEIN) 02-999 MCG-MG CAPS Take 1 capsule by mouth daily.      No current facility-administered medications for this visit.      PHYSICAL EXAMINATION: ECOG PERFORMANCE STATUS: 1 - Symptomatic but completely ambulatory Vitals:   05/21/19 0840  BP: 123/76  Pulse: 78  Resp: 18  Temp: (!) 96.6 F (35.9 C)   Filed Weights   05/21/19 0840  Weight: 207 lb 9.6 oz (94.2 kg)    Physical Exam Constitutional:      General: She is not in acute distress. HENT:     Head: Normocephalic and atraumatic.  Eyes:     General: No scleral icterus.    Pupils: Pupils are equal, round, and reactive to light.     Comments: Right eye subconjunctival hemorrhage.  Neck:     Musculoskeletal: Normal range of motion and neck supple.  Cardiovascular:     Rate and Rhythm: Normal rate and regular rhythm.     Heart sounds: Normal heart sounds.  Pulmonary:     Effort: Pulmonary effort is normal. No respiratory distress.     Breath sounds: No wheezing.  Abdominal:     General: Bowel sounds are normal. There is no distension.     Palpations: Abdomen is soft. There is no mass.     Tenderness: There is no abdominal tenderness.     Comments: + colostomy bag with small amount of yellow color stool.  Musculoskeletal: Normal range of motion.        General: No deformity.  Skin:    General: Skin is warm and dry.      Findings: No erythema or rash.  Neurological:     Mental Status: She is alert and oriented to person, place, and time.     Cranial Nerves: No cranial nerve deficit.     Coordination: Coordination normal.  Psychiatric:        Behavior: Behavior normal.        Thought Content: Thought content normal.   Labs are reviewed and discussed with patient  CMP Latest Ref Rng & Units 05/21/2019  Glucose 70 - 99 mg/dL 115(H)  BUN 8 - 23 mg/dL 7(L)  Creatinine 0.44 - 1.00 mg/dL 1.00  Sodium 135 - 145 mmol/L 140  Potassium 3.5 - 5.1 mmol/L 3.5  Chloride 98 - 111 mmol/L 105  CO2 22 - 32 mmol/L 26  Calcium 8.9 - 10.3 mg/dL 9.5  Total Protein 6.5 - 8.1 g/dL 7.0  Total Bilirubin 0.3 - 1.2 mg/dL 0.5  Alkaline Phos 38 - 126 U/L 36(L)  AST 15 - 41 U/L 28  ALT 0 - 44 U/L 12   CBC Latest Ref Rng & Units 05/21/2019  WBC 4.0 - 10.5 K/uL 2.7(L)  Hemoglobin 12.0 - 15.0 g/dL 10.8(L)  Hematocrit 36.0 - 46.0 % 33.5(L)  Platelets 150 - 400 K/uL 130(L)    LABORATORY DATA:  I have reviewed the data as listed Lab Results  Component Value Date   WBC 2.7 (L) 05/21/2019   HGB 10.8 (L) 05/21/2019   HCT 33.5 (L) 05/21/2019   MCV 96.0 05/21/2019   PLT 130 (L) 05/21/2019   Recent Labs    04/29/19 0822 05/07/19 0930 05/21/19 0803  NA 142 140 140  K 3.5 3.4* 3.5  CL 103 104 105  CO2 '28 27 26  '$ GLUCOSE 112* 105* 115*  BUN 9 10 7*  CREATININE 0.97 0.85 1.00  CALCIUM 9.7 9.5 9.5  GFRNONAA 58* >60 56*  GFRAA >60 >  60 >60  PROT 7.1 7.2 7.0  ALBUMIN 4.2 3.9 4.1  AST '24 25 28  '$ ALT '14 13 12  '$ ALKPHOS 36* 37* 36*  BILITOT 0.7 0.6 0.5   Iron/TIBC/Ferritin/ %Sat No results found for: IRON, TIBC, FERRITIN, IRONPCTSAT   RADIOGRAPHIC STUDIES: I have personally reviewed the radiological images as listed and agreed with the findings in the report. #11/19/2018 CT chest abdomen pelvis chest 1. Thickened endometrium with some irregularity compatible with the provided diagnosis of endometrial malignancy. There is a  mildly prominent left inguinal lymph node at 1.4 cm in short axis which may reflect nodal spread. 2. Bilateral cystic renal lesions. We do not have precontrast images in order to be able to assigned Bosniak classifications. No significant degree of de-enhancement to favor solid mass. 3. Aortic Atherosclerosis (ICD10-I70.0). Coronary atherosclerosis. Sigmoid colon diverticulosis, with several areas of mild colon wall thickening probably from contraction. Correlation with the patient's colon cancer screening history is recommended. If screening is not up-to-date, appropriate screening should be considered. 4. Lumbar impingement at L3-4 and L4-5.5. Esophageal reflux versus dysmotility.  ASSESSMENT & PLAN:  1. Rectal cancer ([place])   2. Chemotherapy induced neutropenia ([facility])   3. Anemia due to antineoplastic chemotherapy   4. Encounter for antineoplastic chemotherapy   5. Subconjunctival hemorrhage of right eye   Cancer Staging Rectal cancer [facility]) Staging form: Colon and Rectum, AJCC 8th Edition - Clinical stage from 01/23/2019: Stage IIIC (cT4b, cN1a, cM0) - Signed by Earlie Server, MD on 01/23/2019 #Stage IIIc rectal cancer Tolerating neoadjuvant chemotherapy. Labs reviewed and discussed with patient. Chemotherapy-induced neutropenia, ANC 1.2, continue monitor Counts acceptable to proceed with FOLFOX today.   #Hypokalemia, potassium 3.5, continue oral potassium 10 mEq daily. # Weight loss, stable.  Follow-up with dietitian. #Chemotherapy-induced anemia, hemoglobin 10.8, stable. #Thrombocytopenia, platelet counts 1 30,000 .  Continue to monitor #Subconjunctival hemorrhage of right eye, likely due to with Valsalva associated with her blowing nose.  Recommend observation.  She is asymptomatic.  Advised patient to notify her ophthalmologist if symptoms persist or worsen.  #Family history with half brother having's history of colon cancer prostate cancer.  Personal history of colorectal cancer.  Patient  has not decided if she wants genetic testing.  She will update me subconjunctival  Follow up in 2 weeks.   Earlie Server, MD, PhD

## 2019-05-21 NOTE — Progress Notes (Signed)
Pt woke up with right eye redness. She is still working with colostomy. Everything else is good

## 2019-05-22 LAB — CEA: CEA: 54.1 ng/mL — ABNORMAL HIGH (ref 0.0–4.7)

## 2019-05-23 ENCOUNTER — Other Ambulatory Visit: Payer: Self-pay

## 2019-05-23 ENCOUNTER — Inpatient Hospital Stay: Payer: Medicare Other

## 2019-05-23 VITALS — BP 125/73 | HR 70 | Temp 96.5°F | Resp 20

## 2019-05-23 DIAGNOSIS — C20 Malignant neoplasm of rectum: Secondary | ICD-10-CM | POA: Diagnosis not present

## 2019-05-23 MED ORDER — HEPARIN SOD (PORK) LOCK FLUSH 100 UNIT/ML IV SOLN
500.0000 [IU] | Freq: Once | INTRAVENOUS | Status: AC | PRN
  Administered 2019-05-23: 500 [IU]
  Filled 2019-05-23: qty 5

## 2019-06-04 ENCOUNTER — Inpatient Hospital Stay: Payer: Medicare Other

## 2019-06-04 ENCOUNTER — Other Ambulatory Visit: Payer: Self-pay

## 2019-06-04 ENCOUNTER — Inpatient Hospital Stay (HOSPITAL_BASED_OUTPATIENT_CLINIC_OR_DEPARTMENT_OTHER): Payer: Medicare Other | Admitting: Oncology

## 2019-06-04 ENCOUNTER — Encounter: Payer: Self-pay | Admitting: Oncology

## 2019-06-04 VITALS — BP 124/72 | HR 61 | Temp 96.2°F | Resp 16 | Ht 65.0 in | Wt 202.4 lb

## 2019-06-04 DIAGNOSIS — Z5111 Encounter for antineoplastic chemotherapy: Secondary | ICD-10-CM

## 2019-06-04 DIAGNOSIS — D701 Agranulocytosis secondary to cancer chemotherapy: Secondary | ICD-10-CM | POA: Diagnosis not present

## 2019-06-04 DIAGNOSIS — D6481 Anemia due to antineoplastic chemotherapy: Secondary | ICD-10-CM | POA: Diagnosis not present

## 2019-06-04 DIAGNOSIS — C20 Malignant neoplasm of rectum: Secondary | ICD-10-CM | POA: Diagnosis not present

## 2019-06-04 DIAGNOSIS — R634 Abnormal weight loss: Secondary | ICD-10-CM

## 2019-06-04 DIAGNOSIS — T451X5A Adverse effect of antineoplastic and immunosuppressive drugs, initial encounter: Secondary | ICD-10-CM

## 2019-06-04 LAB — COMPREHENSIVE METABOLIC PANEL
ALT: 14 U/L (ref 0–44)
AST: 27 U/L (ref 15–41)
Albumin: 4.1 g/dL (ref 3.5–5.0)
Alkaline Phosphatase: 37 U/L — ABNORMAL LOW (ref 38–126)
Anion gap: 8 (ref 5–15)
BUN: 9 mg/dL (ref 8–23)
CO2: 28 mmol/L (ref 22–32)
Calcium: 9.6 mg/dL (ref 8.9–10.3)
Chloride: 104 mmol/L (ref 98–111)
Creatinine, Ser: 0.92 mg/dL (ref 0.44–1.00)
GFR calc Af Amer: >60 mL/min (ref >60)
GFR calc non Af Amer: >60 mL/min (ref >60)
Glucose, Bld: 112 mg/dL — ABNORMAL HIGH (ref 70–99)
Potassium: 3.5 mmol/L (ref 3.5–5.1)
Sodium: 140 mmol/L (ref 135–145)
Total Bilirubin: 0.7 mg/dL (ref 0.3–1.2)
Total Protein: 7.1 g/dL (ref 6.5–8.1)

## 2019-06-04 LAB — CBC WITH DIFFERENTIAL/PLATELET
Abs Immature Granulocytes: 0.01 10*3/uL (ref 0.00–0.07)
Basophils Absolute: 0 10*3/uL (ref 0.0–0.1)
Basophils Relative: 0 %
Eosinophils Absolute: 0.1 10*3/uL (ref 0.0–0.5)
Eosinophils Relative: 2 %
HCT: 33.2 % — ABNORMAL LOW (ref 36.0–46.0)
Hemoglobin: 10.9 g/dL — ABNORMAL LOW (ref 12.0–15.0)
Immature Granulocytes: 0 %
Lymphocytes Relative: 28 %
Lymphs Abs: 0.9 10*3/uL (ref 0.7–4.0)
MCH: 31.1 pg (ref 26.0–34.0)
MCHC: 32.8 g/dL (ref 30.0–36.0)
MCV: 94.6 fL (ref 80.0–100.0)
Monocytes Absolute: 0.5 10*3/uL (ref 0.1–1.0)
Monocytes Relative: 15 %
Neutro Abs: 1.8 10*3/uL (ref 1.7–7.7)
Neutrophils Relative %: 55 %
Platelets: 122 10*3/uL — ABNORMAL LOW (ref 150–400)
RBC: 3.51 MIL/uL — ABNORMAL LOW (ref 3.87–5.11)
RDW: 15 % (ref 11.5–15.5)
WBC: 3.3 10*3/uL — ABNORMAL LOW (ref 4.0–10.5)
nRBC: 0 % (ref 0.0–0.2)

## 2019-06-04 MED ORDER — LEUCOVORIN CALCIUM INJECTION 350 MG
850.0000 mg | Freq: Once | INTRAVENOUS | Status: AC
  Administered 2019-06-04: 850 mg via INTRAVENOUS
  Filled 2019-06-04: qty 17.5

## 2019-06-04 MED ORDER — DEXTROSE 5 % IV SOLN
Freq: Once | INTRAVENOUS | Status: AC
  Administered 2019-06-04: 10:00:00 via INTRAVENOUS
  Filled 2019-06-04: qty 250

## 2019-06-04 MED ORDER — SODIUM CHLORIDE 0.9 % IV SOLN
2400.0000 mg/m2 | INTRAVENOUS | Status: DC
  Administered 2019-06-04: 5000 mg via INTRAVENOUS
  Filled 2019-06-04: qty 100

## 2019-06-04 MED ORDER — OXALIPLATIN CHEMO INJECTION 100 MG/20ML
72.0000 mg/m2 | Freq: Once | INTRAVENOUS | Status: AC
  Administered 2019-06-04: 150 mg via INTRAVENOUS
  Filled 2019-06-04: qty 10

## 2019-06-04 MED ORDER — PALONOSETRON HCL INJECTION 0.25 MG/5ML
0.2500 mg | Freq: Once | INTRAVENOUS | Status: AC
  Administered 2019-06-04: 11:00:00 0.25 mg via INTRAVENOUS
  Filled 2019-06-04: qty 5

## 2019-06-04 MED ORDER — LEUCOVORIN CALCIUM INJECTION 350 MG: 800.0000 mg | Freq: Once | INTRAVENOUS | Status: DC

## 2019-06-04 MED ORDER — SODIUM CHLORIDE 0.9% FLUSH
10.0000 mL | INTRAVENOUS | Status: DC | PRN
  Administered 2019-06-04: 10 mL via INTRAVENOUS
  Filled 2019-06-04: qty 10

## 2019-06-04 MED ORDER — DEXAMETHASONE SODIUM PHOSPHATE 10 MG/ML IJ SOLN
10.0000 mg | Freq: Once | INTRAMUSCULAR | Status: AC
  Administered 2019-06-04: 11:00:00 10 mg via INTRAVENOUS
  Filled 2019-06-04: qty 1

## 2019-06-04 NOTE — Progress Notes (Signed)
Hematology/Oncology progress note [facility] [phone] [phone]   Patient Care Team: Kirk Ruths, MD as PCP - General (Internal Medicine) Clent Jacks, RN as Registered Nurse  REFERRING PROVIDER: Dr.Secord CHIEF COMPLAINTS/REASON FOR VISIT:  Evaluation of endometrial cancer  HISTORY OF PRESENTING ILLNESS:  Jacqueline Yoder is a  73 y.o.  female with PMH listed below who was referred to me for evaluation of endometrial cancer.   Patient initially presented with complaints of postmenopausal bleeding on 08/16/2018.  History of was menopausal vaginal bleeding in 2016 which resulted in cervical polypectomy.  Pathology 02/04/2015 showed cervical polyp, consistent with benign endometrial polyp.  [name] after polypectomy due to anxiety associated with pelvic exams.  pelvic exam on 08/16/2018 reviewed cervical abnormality and from enlarged uterus. Seen by Dr. Marcelline Mates on 10/29/2018.  Endometrial biopsy and a Pap smear was performed. 10/29/2018 Pap smear showed adenocarcinoma, favor endometrial origin. 10/29/2018 endometrial biopsy showed endometrioid carcinoma, FIGO grade 1.  10/29/2018- TA & TV Ultrasound revealed: Anteverted uterus measuring 8.7 x 5.6 x 6.4 cm without evidence of focal masses.  The endometrium measuring 24.1 mm (thickened) and heterogeneous.  Right and left ovaries not visualized.  No adnexal masses identified.  No free fluid in cul-de-sac.  Patient was seen by Dr. Theora Gianotti in clinic on 11/13/2018.  Cervical exam reveals 2 cm exophytic irregular mass consistent with malignancy.   11/19/2018 CT chest abdomen pelvis with contrast showed thickened endometrium with some irregularity compatible with the provided diagnosis of endometrial malignancy.  There is a mildly prominent left inguinal node 1.4 cm.  Patient was seen by Dr. Fransisca Connors on 11/20/2018 and left groin lymph node biopsy was recommended.  11/26/2018 patient  underwent left inguinal lymph node biopsy. Pathology showed metastatic adenocarcinoma consistent with colorectal origin.  CDX 2+.  Case was discussed on tumor board.  Recommend colonoscopy for further evaluation.  Patient reports significant weight loss 30 pounds over the last year.  Chronic vaginal spotting. Change of bowel habits the past few months.  More constipated.  Family history positive for brother who has colon cancer prostate cancer.  patient has underwent colonoscopy on 12/03/2018 which reviewed a nonobstructing large mass in the rectum.  Also chronic fistula.  Mass was not circumferential.  This was biopsied with a cold forceps for histology.  Pathology came back hyperplastic polyp negative for dysplasia and malignancy. [name], patient underwent flex sigmoidoscopy on 12/06/2018 with rebiopsy of the rectal mass. This time biopsy results came back positive for invasive colorectal adenocarcinoma, moderately differentiated. Immunotherapy for nearly mismatch repair protein (MMR ) was performed.  There is no loss of MMR expression.  low probability of MSI high.   # Seen by [name] for evaluation of resectability for rectal cancer. In addition, she also had a second opinion with [facility] where her endometrial biopsy pathology was changed to  adenocarcinoma, consistent with colorectal primary.   [name]. She has home health that has been assisting with ostomy care  Patient was also evaluated by [facility] oncology.  Recommendation is to proceed with TNT with concurrent chemoradiation followed by neoadjuvant chemotherapy followed by surgical resection. Patient prefers to have treatment done locally with [facility].   # Oncology Treatment:  02/03/2019- 03/19/2019  concurrent Xeloda and radiation.  Xeloda dose 874m /m2 BID - rounded to 16571mBID- on days of radiation. 04/09/2019, started on FOLFOX with bolus 5-FU omitted.    INTERVAL HISTORY  Jacqueline Yoder is a 73 y.o. female who has above history reviewed by me today presents for evaluation after neoadjuvant chemotherapy FOLFOX for treatment of rectal cancer. became I am in the dorsal medial for quick Status post 4 cycles of FOLFOX.   She reports doing well. Right subconjunctival hemorrhage is getting better.  Denies any eye pain. Denies any fever, chills, nausea, vomiting, diarrhea. He is now taking fiber and use Colace every other day which she feels been helping with her bowel movement. Appetite is fair.  She has lost weight since last visit about 5 pounds.    Review of Systems  Constitutional: Positive for fatigue. Negative for appetite change, chills, fever and unexpected weight change.  HENT:   Negative for hearing loss and voice change.   Eyes: Negative for eye problems.  Respiratory: Negative for chest tightness and cough.   Cardiovascular: Negative for chest pain.  Gastrointestinal: Negative for abdominal distention, abdominal pain, blood in stool, constipation, diarrhea and nausea.       Colostomy bag  Endocrine: Negative for hot flashes.  Genitourinary: Negative for difficulty urinating and frequency.   Musculoskeletal: Negative for arthralgias.  Skin: Negative for itching and rash.  Neurological: Negative for extremity weakness.  Hematological: Negative for adenopathy.  Psychiatric/Behavioral: Negative for confusion.    MEDICAL HISTORY:  Past Medical History:  Diagnosis Date  . Arthritis   . Cancer ([place])    rectal  . GERD (gastroesophageal reflux disease)   . Hypercholesteremia   . Hypertension   . Hypertension   . Lower extremity edema   . Urinary incontinence     SURGICAL HISTORY: Past Surgical History:  Procedure Laterality Date  . CHOLECYSTECTOMY  1971  . COLONOSCOPY WITH PROPOFOL N/A 12/03/2018   Procedure: COLONOSCOPY WITH PROPOFOL;  Surgeon: Lucilla Lame, MD;  Location: [facility] ENDOSCOPY;  Service: Endoscopy;  Laterality:  N/A;  . FLEXIBLE SIGMOIDOSCOPY N/A 12/06/2018   Procedure: FLEXIBLE SIGMOIDOSCOPY;  Surgeon: [name], MD;  Location: [facility] ENDOSCOPY;  Service: Endoscopy;  Laterality: N/A;  . LAPAROSCOPIC COLOSTOMY  01/06/2019  . PORTACATH PLACEMENT N/A 04/03/2019   Procedure: INSERTION PORT-A-CATH;  Surgeon: Jules Husbands, MD;  Location: [facility] ORS;  Service: General;  Laterality: N/A;    SOCIAL HISTORY: Social History   Socioeconomic History  . Marital status: Married    Spouse name: Not on file  . Number of children: Not on file  . Years of education: Not on file  . Highest education level: Not on file  Occupational History  . Not on file  Social Needs  . Financial resource strain: Not on file  . Food insecurity    Worry: Not on file    Inability: Not on file  . Transportation needs    Medical: Not on file    Non-medical: Not on file  Tobacco Use  . Smoking status: Former Smoker    Quit date: 12/02/1977    Years since quitting: 41.5  . Smokeless tobacco: Never Used  Substance and Sexual Activity  . Alcohol use: Never    Frequency: Never  . Drug use: Never  . Sexual activity: Not Currently    Birth control/protection: None  Lifestyle  . Physical activity    Days per week: Not on file    Minutes per session: Not on file  . Stress: Not on file  Relationships  . Social Herbalist on phone: Not on file    Gets together: Not on file    Attends  religious service: Not on file    Active member of club or organization: Not on file    Attends meetings of clubs or organizations: Not on file    Relationship status: Not on file  . Intimate partner violence    Fear of current or ex partner: Not on file    Emotionally abused: Not on file    Physically abused: Not on file    Forced sexual activity: Not on file  Other Topics Concern  . Not on file  Social History Narrative  . Not on file    FAMILY HISTORY: Family History  Problem Relation Age of Onset  . Colon cancer  Brother   . Prostate cancer Brother   . Hypertension Mother   . Stroke Mother   . Kidney failure Father   . Breast cancer Neg Hx   . Ovarian cancer Neg Hx     ALLERGIES:  is allergic to sulfamethoxazole-trimethoprim.  MEDICATIONS:  Current Outpatient Medications  Medication Sig Dispense Refill  . chlorhexidine (PERIDEX) 0.12 % solution Use as directed 15 mLs in the mouth or throat 2 (two) times daily. 473 mL 3  . Cholecalciferol (VITAMIN D3) 2000 units capsule Take 2,000 Units by mouth daily.    . diclofenac sodium (VOLTAREN) 1 % GEL Apply 2 g topically 4 (four) times daily as needed (joint pain).   11  . docusate sodium (COLACE) 100 MG capsule Take 100 mg by mouth 2 (two) times daily.    . fexofenadine (ALLEGRA) 180 MG tablet Take 180 mg by mouth daily.    Marland Kitchen FIBER ADULT GUMMIES PO Take 1 Dose by mouth daily.    . fluticasone (FLONASE) 50 MCG/ACT nasal spray Place 1 spray into both nostrils 2 (two) times a day.     Marland Kitchen HYDROcodone-acetaminophen (NORCO/VICODIN) 5-325 MG tablet Take 1 tablet by mouth every 4 (four) hours as needed for moderate pain. 8 tablet 0  . ketorolac (ACULAR) 0.4 % SOLN Place 1 drop into both eyes daily.     Marland Kitchen lidocaine-prilocaine (EMLA) cream Apply 1 application topically as needed. 30 g 6  . loperamide (IMODIUM) 2 MG capsule Take 1 capsule (2 mg total) by mouth See admin instructions. With onset of loose stool, take '4mg'$  followed by '2mg'$  every 2 hours,  Maximum: 16 mg/day 120 capsule 1  . Multiple Vitamins-Minerals (ONE-A-DAY WOMENS 50 PLUS PO) Take 1 tablet by mouth daily.     Marland Kitchen nystatin (MYCOSTATIN/NYSTOP) powder Apply topically 4 (four) times daily. 30 g 0  . ondansetron (ZOFRAN) 8 MG tablet Take 1 tablet (8 mg total) by mouth every 8 (eight) hours as needed for nausea, vomiting or refractory nausea / vomiting. 90 tablet 1  . pantoprazole (PROTONIX) 40 MG tablet Take 1 tablet (40 mg total) by mouth as needed. (Patient taking differently: Take 40 mg by mouth daily as  needed (heartburn). ) 30 tablet 0  . potassium chloride SA (K-DUR) 20 MEQ tablet Take 20 mEq by mouth daily.    . prednisoLONE acetate (PRED FORTE) 1 % ophthalmic suspension Place 1 drop into both eyes 2 (two) times a day.   1  . prochlorperazine (COMPAZINE) 10 MG tablet Take 1 tablet (10 mg total) by mouth every 6 (six) hours as needed (Nausea or vomiting). 30 tablet 1  . simvastatin (ZOCOR) 40 MG tablet Take 40 mg by mouth at bedtime.     . triamterene-hydrochlorothiazide (DYAZIDE) 37.5-25 MG capsule Take 1 capsule by mouth daily.     Marland Kitchen  Lutein 40 MG CAPS Take 40 mg by mouth daily.     . Lutein-Bilberry (BILBERRY PLUS LUTEIN) 02-999 MCG-MG CAPS Take 1 capsule by mouth daily.      No current facility-administered medications for this visit.    Facility-Administered Medications Ordered in Other Visits  Medication Dose Route Frequency Provider Last Rate Last Dose  . fluorouracil (ADRUCIL) 5,000 mg in sodium chloride 0.9 % 150 mL chemo infusion  2,400 mg/m2 (Order-Specific) Intravenous 1 day or 1 dose Earlie Server, MD   5,000 mg at 06/04/19 1340  . sodium chloride flush (NS) 0.9 % injection 10 mL  10 mL Intravenous PRN Earlie Server, MD   10 mL at 06/04/19 0833     PHYSICAL EXAMINATION: ECOG PERFORMANCE STATUS: 1 - Symptomatic but completely ambulatory Vitals:   06/04/19 0951  BP: 124/72  Pulse: 61  Resp: 16  Temp: (!) 96.2 F (35.7 C)   Filed Weights   06/04/19 0951  Weight: 202 lb 6.4 oz (91.8 kg)    Physical Exam Constitutional:      General: She is not in acute distress. HENT:     Head: Normocephalic and atraumatic.  Eyes:     General: No scleral icterus.    Pupils: Pupils are equal, round, and reactive to light.     Comments: Right eye subconjunctival hemorrhage.  Neck:     Musculoskeletal: Normal range of motion and neck supple.  Cardiovascular:     Rate and Rhythm: Normal rate and regular rhythm.     Heart sounds: Normal heart sounds.  Pulmonary:     Effort: Pulmonary  effort is normal. No respiratory distress.     Breath sounds: No wheezing.  Abdominal:     General: Bowel sounds are normal. There is no distension.     Palpations: Abdomen is soft. There is no mass.     Tenderness: There is no abdominal tenderness.     Comments: + colostomy bag with small amount of yellow color stool.  Musculoskeletal: Normal range of motion.        General: No deformity.  Skin:    General: Skin is warm and dry.     Findings: No erythema or rash.  Neurological:     Mental Status: She is alert and oriented to person, place, and time.     Cranial Nerves: No cranial nerve deficit.     Coordination: Coordination normal.  Psychiatric:        Behavior: Behavior normal.        Thought Content: Thought content normal.   Labs are reviewed and discussed with patient  CMP Latest Ref Rng & Units 06/04/2019  Glucose 70 - 99 mg/dL 112(H)  BUN 8 - 23 mg/dL 9  Creatinine 0.44 - 1.00 mg/dL 0.92  Sodium 135 - 145 mmol/L 140  Potassium 3.5 - 5.1 mmol/L 3.5  Chloride 98 - 111 mmol/L 104  CO2 22 - 32 mmol/L 28  Calcium 8.9 - 10.3 mg/dL 9.6  Total Protein 6.5 - 8.1 g/dL 7.1  Total Bilirubin 0.3 - 1.2 mg/dL 0.7  Alkaline Phos 38 - 126 U/L 37(L)  AST 15 - 41 U/L 27  ALT 0 - 44 U/L 14   CBC Latest Ref Rng & Units 06/04/2019  WBC 4.0 - 10.5 K/uL 3.3(L)  Hemoglobin 12.0 - 15.0 g/dL 10.9(L)  Hematocrit 36.0 - 46.0 % 33.2(L)  Platelets 150 - 400 K/uL 122(L)    LABORATORY DATA:  I have reviewed the data as listed Lab Results  Component Value Date   WBC 3.3 (L) 06/04/2019   HGB 10.9 (L) 06/04/2019   HCT 33.2 (L) 06/04/2019   MCV 94.6 06/04/2019   PLT 122 (L) 06/04/2019   Recent Labs    05/07/19 0930 05/21/19 0803 06/04/19 0833  NA 140 140 140  K 3.4* 3.5 3.5  CL 104 105 104  CO2 '27 26 28  '$ GLUCOSE 105* 115* 112*  BUN 10 7* 9  CREATININE 0.85 1.00 0.92  CALCIUM 9.5 9.5 9.6  GFRNONAA >60 56* >60  GFRAA >60 >60 >60  PROT 7.2 7.0 7.1  ALBUMIN 3.9 4.1 4.1  AST '25 28  27  '$ ALT '13 12 14  '$ ALKPHOS 37* 36* 37*  BILITOT 0.6 0.5 0.7   Iron/TIBC/Ferritin/ %Sat No results found for: IRON, TIBC, FERRITIN, IRONPCTSAT   RADIOGRAPHIC STUDIES: I have personally reviewed the radiological images as listed and agreed with the findings in the report. #11/19/2018 CT chest abdomen pelvis chest 1. Thickened endometrium with some irregularity compatible with the provided diagnosis of endometrial malignancy. There is a mildly prominent left inguinal lymph node at 1.4 cm in short axis which may reflect nodal spread. 2. Bilateral cystic renal lesions. We do not have precontrast images in order to be able to assigned Bosniak classifications. No significant degree of de-enhancement to favor solid mass. 3. Aortic Atherosclerosis (ICD10-I70.0). Coronary atherosclerosis. Sigmoid colon diverticulosis, with several areas of mild colon wall thickening probably from contraction. Correlation with the patient's colon cancer screening history is recommended. If screening is not up-to-date, appropriate screening should be considered. 4. Lumbar impingement at L3-4 and L4-5.5. Esophageal reflux versus dysmotility.  ASSESSMENT & PLAN:  1. Rectal cancer ([place])   2. Anemia due to antineoplastic chemotherapy   3. Encounter for antineoplastic chemotherapy   4. Chemotherapy induced neutropenia (H[facility])   5. Loss of weight   Cancer Staging Rectal cancer [facility]) Staging form: Colon and Rectum, AJCC 8th Edition - Clinical stage from 01/23/2019: Stage IIIC (cT4b, cN1a, cM0) - Signed by Earlie Server, MD on 01/23/2019 #Stage IIIc rectal cancer Tolerating neoadjuvant chemotherapy FOLFOX/no bolus 5-FU with mild to moderate difficulties. Labs reviewed and discussed with patient. Counts acceptable to proceed with cycle 5 FOLFOX today.  #Chemotherapy-induced neutropenia, resolved. #Anemia, secondary to chemotherapy.  Hemoglobin stable at 10.9.  Continue to monitor. #Thrombocytopenia secondary to chemotherapy platelet  counts at 11/04/1998, stable.  Continue to monitor. #Weight loss, continue follow-up with dietitian. #Hypokalemia, potassium level 3.5.  Recommend patient to continue take potassium supplementation 20 mEq daily  #Family history with half brother having's history of colon cancer prostate cancer.  Personal history of colorectal cancer.  Patient has not decided if she wants genetic testing.  She will update me subconjunctival  Follow up in 2 weeks.   Earlie Server, MD, PhD

## 2019-06-04 NOTE — Progress Notes (Signed)
She is doing great, the only thing she has is colostomy

## 2019-06-06 ENCOUNTER — Inpatient Hospital Stay: Payer: Medicare Other

## 2019-06-06 ENCOUNTER — Other Ambulatory Visit: Payer: Self-pay

## 2019-06-06 DIAGNOSIS — C20 Malignant neoplasm of rectum: Secondary | ICD-10-CM | POA: Diagnosis not present

## 2019-06-06 MED ORDER — HEPARIN SOD (PORK) LOCK FLUSH 100 UNIT/ML IV SOLN
500.0000 [IU] | Freq: Once | INTRAVENOUS | Status: AC
  Administered 2019-06-06: 500 [IU] via INTRAVENOUS

## 2019-06-06 MED ORDER — HEPARIN SOD (PORK) LOCK FLUSH 100 UNIT/ML IV SOLN
INTRAVENOUS | Status: AC
  Filled 2019-06-06: qty 5

## 2019-06-06 MED ORDER — SODIUM CHLORIDE 0.9% FLUSH
10.0000 mL | INTRAVENOUS | Status: DC | PRN
  Administered 2019-06-06: 13:00:00 10 mL via INTRAVENOUS
  Filled 2019-06-06: qty 10

## 2019-06-17 NOTE — Progress Notes (Signed)
Patient is coming in for follow up, she mentions she is doing well. Went over patient medications. She has not had any new symptoms related to covid, not been around anyone with covid.

## 2019-06-18 ENCOUNTER — Other Ambulatory Visit: Payer: Self-pay

## 2019-06-18 ENCOUNTER — Inpatient Hospital Stay (HOSPITAL_BASED_OUTPATIENT_CLINIC_OR_DEPARTMENT_OTHER): Payer: Medicare Other | Admitting: Oncology

## 2019-06-18 ENCOUNTER — Inpatient Hospital Stay: Payer: Medicare Other

## 2019-06-18 ENCOUNTER — Inpatient Hospital Stay: Payer: Medicare Other | Attending: Oncology

## 2019-06-18 VITALS — BP 110/95 | HR 93 | Temp 96.9°F | Wt 201.0 lb

## 2019-06-18 DIAGNOSIS — Z8042 Family history of malignant neoplasm of prostate: Secondary | ICD-10-CM | POA: Diagnosis not present

## 2019-06-18 DIAGNOSIS — Z79899 Other long term (current) drug therapy: Secondary | ICD-10-CM | POA: Diagnosis not present

## 2019-06-18 DIAGNOSIS — Z800 Family history of malignant neoplasm of digestive organs: Secondary | ICD-10-CM | POA: Insufficient documentation

## 2019-06-18 DIAGNOSIS — R202 Paresthesia of skin: Secondary | ICD-10-CM | POA: Insufficient documentation

## 2019-06-18 DIAGNOSIS — E876 Hypokalemia: Secondary | ICD-10-CM | POA: Insufficient documentation

## 2019-06-18 DIAGNOSIS — K5900 Constipation, unspecified: Secondary | ICD-10-CM | POA: Insufficient documentation

## 2019-06-18 DIAGNOSIS — D6481 Anemia due to antineoplastic chemotherapy: Secondary | ICD-10-CM

## 2019-06-18 DIAGNOSIS — R634 Abnormal weight loss: Secondary | ICD-10-CM | POA: Diagnosis not present

## 2019-06-18 DIAGNOSIS — Z87891 Personal history of nicotine dependence: Secondary | ICD-10-CM | POA: Diagnosis not present

## 2019-06-18 DIAGNOSIS — D696 Thrombocytopenia, unspecified: Secondary | ICD-10-CM

## 2019-06-18 DIAGNOSIS — C20 Malignant neoplasm of rectum: Secondary | ICD-10-CM

## 2019-06-18 DIAGNOSIS — Z933 Colostomy status: Secondary | ICD-10-CM | POA: Insufficient documentation

## 2019-06-18 DIAGNOSIS — T451X5A Adverse effect of antineoplastic and immunosuppressive drugs, initial encounter: Secondary | ICD-10-CM | POA: Diagnosis not present

## 2019-06-18 DIAGNOSIS — E7800 Pure hypercholesterolemia, unspecified: Secondary | ICD-10-CM | POA: Diagnosis not present

## 2019-06-18 DIAGNOSIS — Z5111 Encounter for antineoplastic chemotherapy: Secondary | ICD-10-CM | POA: Insufficient documentation

## 2019-06-18 DIAGNOSIS — R200 Anesthesia of skin: Secondary | ICD-10-CM | POA: Diagnosis not present

## 2019-06-18 DIAGNOSIS — M129 Arthropathy, unspecified: Secondary | ICD-10-CM | POA: Insufficient documentation

## 2019-06-18 DIAGNOSIS — C7982 Secondary malignant neoplasm of genital organs: Secondary | ICD-10-CM | POA: Insufficient documentation

## 2019-06-18 DIAGNOSIS — K219 Gastro-esophageal reflux disease without esophagitis: Secondary | ICD-10-CM | POA: Diagnosis not present

## 2019-06-18 DIAGNOSIS — I10 Essential (primary) hypertension: Secondary | ICD-10-CM | POA: Insufficient documentation

## 2019-06-18 DIAGNOSIS — Z95828 Presence of other vascular implants and grafts: Secondary | ICD-10-CM

## 2019-06-18 LAB — CBC WITH DIFFERENTIAL/PLATELET
Abs Immature Granulocytes: 0.01 10*3/uL (ref 0.00–0.07)
Basophils Absolute: 0 10*3/uL (ref 0.0–0.1)
Basophils Relative: 0 %
Eosinophils Absolute: 0.1 10*3/uL (ref 0.0–0.5)
Eosinophils Relative: 2 %
HCT: 33.6 % — ABNORMAL LOW (ref 36.0–46.0)
Hemoglobin: 10.9 g/dL — ABNORMAL LOW (ref 12.0–15.0)
Immature Granulocytes: 0 %
Lymphocytes Relative: 29 %
Lymphs Abs: 0.9 10*3/uL (ref 0.7–4.0)
MCH: 30.4 pg (ref 26.0–34.0)
MCHC: 32.4 g/dL (ref 30.0–36.0)
MCV: 93.9 fL (ref 80.0–100.0)
Monocytes Absolute: 0.5 10*3/uL (ref 0.1–1.0)
Monocytes Relative: 16 %
Neutro Abs: 1.7 10*3/uL (ref 1.7–7.7)
Neutrophils Relative %: 53 %
Platelets: 137 10*3/uL — ABNORMAL LOW (ref 150–400)
RBC: 3.58 MIL/uL — ABNORMAL LOW (ref 3.87–5.11)
RDW: 15 % (ref 11.5–15.5)
WBC: 3.2 10*3/uL — ABNORMAL LOW (ref 4.0–10.5)
nRBC: 0 % (ref 0.0–0.2)

## 2019-06-18 LAB — COMPREHENSIVE METABOLIC PANEL
ALT: 13 U/L (ref 0–44)
AST: 28 U/L (ref 15–41)
Albumin: 4.1 g/dL (ref 3.5–5.0)
Alkaline Phosphatase: 33 U/L — ABNORMAL LOW (ref 38–126)
Anion gap: 9 (ref 5–15)
BUN: 9 mg/dL (ref 8–23)
CO2: 26 mmol/L (ref 22–32)
Calcium: 9.5 mg/dL (ref 8.9–10.3)
Chloride: 103 mmol/L (ref 98–111)
Creatinine, Ser: 0.78 mg/dL (ref 0.44–1.00)
GFR calc Af Amer: >60 mL/min (ref >60)
GFR calc non Af Amer: >60 mL/min (ref >60)
Glucose, Bld: 109 mg/dL — ABNORMAL HIGH (ref 70–99)
Potassium: 3.1 mmol/L — ABNORMAL LOW (ref 3.5–5.1)
Sodium: 138 mmol/L (ref 135–145)
Total Bilirubin: 0.6 mg/dL (ref 0.3–1.2)
Total Protein: 7 g/dL (ref 6.5–8.1)

## 2019-06-18 MED ORDER — SODIUM CHLORIDE 0.9 % IV SOLN
2400.0000 mg/m2 | INTRAVENOUS | Status: DC
  Administered 2019-06-18: 13:00:00 5000 mg via INTRAVENOUS
  Filled 2019-06-18: qty 100

## 2019-06-18 MED ORDER — DEXAMETHASONE SODIUM PHOSPHATE 10 MG/ML IJ SOLN
10.0000 mg | Freq: Once | INTRAMUSCULAR | Status: AC
  Administered 2019-06-18: 10 mg via INTRAVENOUS
  Filled 2019-06-18: qty 1

## 2019-06-18 MED ORDER — OXALIPLATIN CHEMO INJECTION 100 MG/20ML
72.0000 mg/m2 | Freq: Once | INTRAVENOUS | Status: AC
  Administered 2019-06-18: 11:00:00 150 mg via INTRAVENOUS
  Filled 2019-06-18: qty 20

## 2019-06-18 MED ORDER — PALONOSETRON HCL INJECTION 0.25 MG/5ML
0.2500 mg | Freq: Once | INTRAVENOUS | Status: AC
  Administered 2019-06-18: 11:00:00 0.25 mg via INTRAVENOUS
  Filled 2019-06-18: qty 5

## 2019-06-18 MED ORDER — POTASSIUM CHLORIDE 20 MEQ/100ML IV SOLN
20.0000 meq | Freq: Once | INTRAVENOUS | Status: AC
  Administered 2019-06-18: 09:00:00 20 meq via INTRAVENOUS

## 2019-06-18 MED ORDER — DEXTROSE 5 % IV SOLN
Freq: Once | INTRAVENOUS | Status: AC
  Administered 2019-06-18: 09:00:00 via INTRAVENOUS
  Filled 2019-06-18: qty 250

## 2019-06-18 MED ORDER — SODIUM CHLORIDE 0.9% FLUSH
10.0000 mL | Freq: Once | INTRAVENOUS | Status: AC
  Administered 2019-06-18: 10 mL via INTRAVENOUS
  Filled 2019-06-18: qty 10

## 2019-06-18 MED ORDER — LEUCOVORIN CALCIUM INJECTION 350 MG
385.0000 mg/m2 | Freq: Once | INTRAVENOUS | Status: AC
  Administered 2019-06-18: 11:00:00 800 mg via INTRAVENOUS
  Filled 2019-06-18: qty 25

## 2019-06-19 ENCOUNTER — Telehealth: Payer: Self-pay | Admitting: *Deleted

## 2019-06-19 ENCOUNTER — Encounter: Payer: Self-pay | Admitting: *Deleted

## 2019-06-19 ENCOUNTER — Encounter: Payer: Self-pay | Admitting: Oncology

## 2019-06-19 NOTE — Progress Notes (Signed)
Hematology/Oncology progress note [facility] [phone] [phone]   Patient Care Team: Kirk Ruths, MD as PCP - General (Internal Medicine) Clent Jacks, RN as Registered Nurse  REFERRING PROVIDER: Dr.Secord CHIEF COMPLAINTS/REASON FOR VISIT:  Evaluation of endometrial cancer  HISTORY OF PRESENTING ILLNESS:  Jacqueline Yoder is a  73 y.o.  female with PMH listed below who was referred to me for evaluation of endometrial cancer.   Patient initially presented with complaints of postmenopausal bleeding on 08/16/2018.  History of was menopausal vaginal bleeding in 2016 which resulted in cervical polypectomy.  Pathology 02/04/2015 showed cervical polyp, consistent with benign endometrial polyp.  [name] after polypectomy due to anxiety associated with pelvic exams.  pelvic exam on 08/16/2018 reviewed cervical abnormality and from enlarged uterus. Seen by Dr. Marcelline Mates on 10/29/2018.  Endometrial biopsy and a Pap smear was performed. 10/29/2018 Pap smear showed adenocarcinoma, favor endometrial origin. 10/29/2018 endometrial biopsy showed endometrioid carcinoma, FIGO grade 1.  10/29/2018- TA & TV Ultrasound revealed: Anteverted uterus measuring 8.7 x 5.6 x 6.4 cm without evidence of focal masses.  The endometrium measuring 24.1 mm (thickened) and heterogeneous.  Right and left ovaries not visualized.  No adnexal masses identified.  No free fluid in cul-de-sac.  Patient was seen by Dr. Theora Gianotti in clinic on 11/13/2018.  Cervical exam reveals 2 cm exophytic irregular mass consistent with malignancy.   11/19/2018 CT chest abdomen pelvis with contrast showed thickened endometrium with some irregularity compatible with the provided diagnosis of endometrial malignancy.  There is a mildly prominent left inguinal node 1.4 cm.  Patient was seen by Dr. Fransisca Connors on 11/20/2018 and left groin lymph node biopsy was recommended.  11/26/2018 patient  underwent left inguinal lymph node biopsy. Pathology showed metastatic adenocarcinoma consistent with colorectal origin.  CDX 2+.  Case was discussed on tumor board.  Recommend colonoscopy for further evaluation.  Patient reports significant weight loss 30 pounds over the last year.  Chronic vaginal spotting. Change of bowel habits the past few months.  More constipated.  Family history positive for brother who has colon cancer prostate cancer.  patient has underwent colonoscopy on 12/03/2018 which reviewed a nonobstructing large mass in the rectum.  Also chronic fistula.  Mass was not circumferential.  This was biopsied with a cold forceps for histology.  Pathology came back hyperplastic polyp negative for dysplasia and malignancy. [name], patient underwent flex sigmoidoscopy on 12/06/2018 with rebiopsy of the rectal mass. This time biopsy results came back positive for invasive colorectal adenocarcinoma, moderately differentiated. Immunotherapy for nearly mismatch repair protein (MMR ) was performed.  There is no loss of MMR expression.  low probability of MSI high.   # Seen by [name] for evaluation of resectability for rectal cancer. In addition, she also had a second opinion with [facility] where her endometrial biopsy pathology was changed to  adenocarcinoma, consistent with colorectal primary.   [name]. She has home health that has been assisting with ostomy care  Patient was also evaluated by [facility] oncology.  Recommendation is to proceed with TNT with concurrent chemoradiation followed by neoadjuvant chemotherapy followed by surgical resection. Patient prefers to have treatment done locally with [facility].   # Oncology Treatment:  02/03/2019- 03/19/2019  concurrent Xeloda and radiation.  Xeloda dose 874m /m2 BID - rounded to 16571mBID- on days of radiation. 04/09/2019, started on FOLFOX with bolus 5-FU omitted.    INTERVAL HISTORY  Jacqueline Yoder is a 73 y.o. female who has above history reviewed by me today presents for evaluation after neoadjuvant chemotherapy FOLFOX for treatment of rectal cancer. became I am in the dorsal medial for quick Status post 5 cycles of FOLFOX.   Clinically patient is doing well. Denies any fever, chills, nausea, vomiting, diarrhea. Appetite is fair. No new complaints today.  Denies any numbness or tingling. Chronic fatigue, no changes.  Review of Systems  Constitutional: Positive for fatigue. Negative for appetite change, chills, fever and unexpected weight change.  HENT:   Negative for hearing loss and voice change.   Eyes: Negative for eye problems.  Respiratory: Negative for chest tightness and cough.   Cardiovascular: Negative for chest pain.  Gastrointestinal: Negative for abdominal distention, abdominal pain, blood in stool, constipation, diarrhea and nausea.       Colostomy bag  Endocrine: Negative for hot flashes.  Genitourinary: Negative for difficulty urinating and frequency.   Musculoskeletal: Negative for arthralgias.  Skin: Negative for itching and rash.  Neurological: Negative for extremity weakness.  Hematological: Negative for adenopathy.  Psychiatric/Behavioral: Negative for confusion.    MEDICAL HISTORY:  Past Medical History:  Diagnosis Date  . Arthritis   . Cancer ([place])    rectal  . GERD (gastroesophageal reflux disease)   . Hypercholesteremia   . Hypertension   . Hypertension   . Lower extremity edema   . Urinary incontinence     SURGICAL HISTORY: Past Surgical History:  Procedure Laterality Date  . CHOLECYSTECTOMY  1971  . COLONOSCOPY WITH PROPOFOL N/A 12/03/2018   Procedure: COLONOSCOPY WITH PROPOFOL;  Surgeon: Lucilla Lame, MD;  Location: [facility] ENDOSCOPY;  Service: Endoscopy;  Laterality: N/A;  . FLEXIBLE SIGMOIDOSCOPY N/A 12/06/2018   Procedure: FLEXIBLE SIGMOIDOSCOPY;  Surgeon: [name], MD;  Location[facility] ENDOSCOPY;   Service: Endoscopy;  Laterality: N/A;  . LAPAROSCOPIC COLOSTOMY  01/06/2019  . PORTACATH PLACEMENT N/A 04/03/2019   Procedure: INSERTION PORT-A-CATH;  Surgeon: Jules Husbands, MD;  Location: [facility] ORS;  Service: General;  Laterality: N/A;    SOCIAL HISTORY: Social History   Socioeconomic History  . Marital status: Married    Spouse name: Not on file  . Number of children: Not on file  . Years of education: Not on file  . Highest education level: Not on file  Occupational History  . Not on file  Social Needs  . Financial resource strain: Not on file  . Food insecurity    Worry: Not on file    Inability: Not on file  . Transportation needs    Medical: Not on file    Non-medical: Not on file  Tobacco Use  . Smoking status: Former Smoker    Quit date: 12/02/1977    Years since quitting: 41.5  . Smokeless tobacco: Never Used  Substance and Sexual Activity  . Alcohol use: Never    Frequency: Never  . Drug use: Never  . Sexual activity: Not Currently    Birth control/protection: None  Lifestyle  . Physical activity    Days per week: Not on file    Minutes per session: Not on file  . Stress: Not on file  Relationships  . Social Herbalist on phone: Not on file    Gets together: Not on file    Attends religious service: Not on file    Active member of club or organization: Not on file    Attends meetings of clubs or organizations: Not on file  Relationship status: Not on file  . Intimate partner violence    Fear of current or ex partner: Not on file    Emotionally abused: Not on file    Physically abused: Not on file    Forced sexual activity: Not on file  Other Topics Concern  . Not on file  Social History Narrative  . Not on file    FAMILY HISTORY: Family History  Problem Relation Age of Onset  . Colon cancer Brother   . Prostate cancer Brother   . Hypertension Mother   . Stroke Mother   . Kidney failure Father   . Breast cancer Neg Hx   .  Ovarian cancer Neg Hx     ALLERGIES:  is allergic to sulfamethoxazole-trimethoprim.  MEDICATIONS:  Current Outpatient Medications  Medication Sig Dispense Refill  . chlorhexidine (PERIDEX) 0.12 % solution Use as directed 15 mLs in the mouth or throat 2 (two) times daily. 473 mL 3  . Cholecalciferol (VITAMIN D3) 2000 units capsule Take 2,000 Units by mouth daily.    . diclofenac sodium (VOLTAREN) 1 % GEL Apply 2 g topically 4 (four) times daily as needed (joint pain).   11  . docusate sodium (COLACE) 100 MG capsule Take 100 mg by mouth 2 (two) times daily.    . fexofenadine (ALLEGRA) 180 MG tablet Take 180 mg by mouth daily.    Marland Kitchen FIBER ADULT GUMMIES PO Take 1 Dose by mouth daily.    . fluticasone (FLONASE) 50 MCG/ACT nasal spray Place 1 spray into both nostrils 2 (two) times a day.     Marland Kitchen HYDROcodone-acetaminophen (NORCO/VICODIN) 5-325 MG tablet Take 1 tablet by mouth every 4 (four) hours as needed for moderate pain. 8 tablet 0  . ketorolac (ACULAR) 0.4 % SOLN Place 1 drop into both eyes daily.     Marland Kitchen lidocaine-prilocaine (EMLA) cream Apply 1 application topically as needed. 30 g 6  . loperamide (IMODIUM) 2 MG capsule Take 1 capsule (2 mg total) by mouth See admin instructions. With onset of loose stool, take 85m followed by 243mevery 2 hours,  Maximum: 16 mg/day 120 capsule 1  . Lutein 40 MG CAPS Take 40 mg by mouth daily.     . Lutein-Bilberry (BILBERRY PLUS LUTEIN) 02-999 MCG-MG CAPS Take 1 capsule by mouth daily.     . Multiple Vitamins-Minerals (ONE-A-DAY WOMENS 50 PLUS PO) Take 1 tablet by mouth daily.     . Marland Kitchenystatin (MYCOSTATIN/NYSTOP) powder Apply topically 4 (four) times daily. 30 g 0  . ondansetron (ZOFRAN) 8 MG tablet Take 1 tablet (8 mg total) by mouth every 8 (eight) hours as needed for nausea, vomiting or refractory nausea / vomiting. 90 tablet 1  . pantoprazole (PROTONIX) 40 MG tablet Take 1 tablet (40 mg total) by mouth as needed. (Patient taking differently: Take 40 mg by mouth  daily as needed (heartburn). ) 30 tablet 0  . potassium chloride SA (K-DUR) 20 MEQ tablet Take 20 mEq by mouth daily.    . prednisoLONE acetate (PRED FORTE) 1 % ophthalmic suspension Place 1 drop into both eyes 2 (two) times a day.   1  . prochlorperazine (COMPAZINE) 10 MG tablet Take 1 tablet (10 mg total) by mouth every 6 (six) hours as needed (Nausea or vomiting). 30 tablet 1  . simvastatin (ZOCOR) 40 MG tablet Take 40 mg by mouth at bedtime.     . triamterene-hydrochlorothiazide (DYAZIDE) 37.5-25 MG capsule Take 1 capsule by mouth daily.  No current facility-administered medications for this visit.      PHYSICAL EXAMINATION: ECOG PERFORMANCE STATUS: 1 - Symptomatic but completely ambulatory Vitals:   06/18/19 0857  BP: (!) 110/95  Pulse: 93  Temp: (!) 96.9 F (36.1 C)   Filed Weights   06/18/19 0857  Weight: 201 lb (91.2 kg)    Physical Exam Constitutional:      General: She is not in acute distress. HENT:     Head: Normocephalic and atraumatic.  Eyes:     General: No scleral icterus.    Pupils: Pupils are equal, round, and reactive to light.  Neck:     Musculoskeletal: Normal range of motion and neck supple.  Cardiovascular:     Rate and Rhythm: Normal rate and regular rhythm.     Heart sounds: Normal heart sounds.  Pulmonary:     Effort: Pulmonary effort is normal. No respiratory distress.     Breath sounds: No wheezing.  Abdominal:     General: Bowel sounds are normal. There is no distension.     Palpations: Abdomen is soft. There is no mass.     Tenderness: There is no abdominal tenderness.     Comments: + colostomy bag with small amount of yellow color stool.  Musculoskeletal: Normal range of motion.        General: No deformity.  Skin:    General: Skin is warm and dry.     Findings: No erythema or rash.  Neurological:     Mental Status: She is alert and oriented to person, place, and time.     Cranial Nerves: No cranial nerve deficit.      Coordination: Coordination normal.  Psychiatric:        Behavior: Behavior normal.        Thought Content: Thought content normal.    CMP Latest Ref Rng & Units 06/18/2019  Glucose 70 - 99 mg/dL 109(H)  BUN 8 - 23 mg/dL 9  Creatinine 0.44 - 1.00 mg/dL 0.78  Sodium 135 - 145 mmol/L 138  Potassium 3.5 - 5.1 mmol/L 3.1(L)  Chloride 98 - 111 mmol/L 103  CO2 22 - 32 mmol/L 26  Calcium 8.9 - 10.3 mg/dL 9.5  Total Protein 6.5 - 8.1 g/dL 7.0  Total Bilirubin 0.3 - 1.2 mg/dL 0.6  Alkaline Phos 38 - 126 U/L 33(L)  AST 15 - 41 U/L 28  ALT 0 - 44 U/L 13   CBC Latest Ref Rng & Units 06/18/2019  WBC 4.0 - 10.5 K/uL 3.2(L)  Hemoglobin 12.0 - 15.0 g/dL 10.9(L)  Hematocrit 36.0 - 46.0 % 33.6(L)  Platelets 150 - 400 K/uL 137(L)    LABORATORY DATA:  I have reviewed the data as listed Lab Results  Component Value Date   WBC 3.2 (L) 06/18/2019   HGB 10.9 (L) 06/18/2019   HCT 33.6 (L) 06/18/2019   MCV 93.9 06/18/2019   PLT 137 (L) 06/18/2019   Recent Labs    05/21/19 0803 06/04/19 0833 06/18/19 0821  NA 140 140 138  K 3.5 3.5 3.1*  CL 105 104 103  CO2 _0 GLUCOSE 115* 112* 109*  BUN 7* 9 9  CREATININE 1.00 0.92 0.78  CALCIUM 9.5 9.6 9.5  GFRNONAA 56* >60 >60  GFRAA >60 >60 >60  PROT 7.0 7.1 7.0  ALBUMIN 4.1 4.1 4.1  AST _1 ALT _2 ALKPHOS 36* 37* 33*  BILITOT 0.5 0.7 0.6   Iron/TIBC/Ferritin/ %Sat No results  found for: IRON, TIBC, FERRITIN, IRONPCTSAT   RADIOGRAPHIC STUDIES: I have personally reviewed the radiological images as listed and agreed with the findings in the report. #11/19/2018 CT chest abdomen pelvis chest 1. Thickened endometrium with some irregularity compatible with the provided diagnosis of endometrial malignancy. There is a mildly prominent left inguinal lymph node at 1.4 cm in short axis which may reflect nodal spread. 2. Bilateral cystic renal lesions. We do not have precontrast images in order to be able to assigned Bosniak  classifications. No significant degree of de-enhancement to favor solid mass. 3. Aortic Atherosclerosis (ICD10-I70.0). Coronary atherosclerosis. Sigmoid colon diverticulosis, with several areas of mild colon wall thickening probably from contraction. Correlation with the patient's colon cancer screening history is recommended. If screening is not up-to-date, appropriate screening should be considered. 4. Lumbar impingement at L3-4 and L4-5.5. Esophageal reflux versus dysmotility.  ASSESSMENT & PLAN:  1. Rectal cancer ([place])   2. Anemia due to antineoplastic chemotherapy   3. Encounter for antineoplastic chemotherapy   4. Hypokalemia   5. Thrombocytopenia ([place])   Cancer Staging Rectal cancer [facility]) Staging form: Colon and Rectum, AJCC 8th Edition - Clinical stage from 01/23/2019: Stage IIIC (cT4b, cN1a, cM0) - Signed by Earlie Server, MD on 01/23/2019 #Stage IIIc rectal cancer Tolerating neoadjuvant chemotherapy FOLFOX/no bolus 5-FU with mild difficulties. Labs are reviewed and discussed with patient. Counts are stable to proceed with cycle 6 FOLFOX today.  Chemotherapy-induced anemia and thrombocytopenia, stable.  Continue monitor counts. Weight loss, continue follow-up with dietitian.  Appetite is fair. Hypokalemia, advised patient to take potassium chloride 20 mEq twice daily.  We will also give her IV potassium chloride 20 mEq x 1 today.  #Family history with half brother having's history of colon cancer prostate cancer.  Personal history of colorectal cancer.  Patient has not decided if she wants genetic testing.  She will update me subconjunctival  Follow up in 2 weeks for evaluation prior to next cycle of FOLFOX.  Earlie Server, MD, PhD

## 2019-06-19 NOTE — Patient Instructions (Signed)
FMLA forms for patients daughter have been completed, forms have been scanned to clinic staff to obtain MD signature and fax.

## 2019-06-19 NOTE — Telephone Encounter (Signed)
Entered in error

## 2019-06-20 ENCOUNTER — Inpatient Hospital Stay: Payer: Medicare Other

## 2019-06-20 ENCOUNTER — Other Ambulatory Visit: Payer: Self-pay

## 2019-06-20 DIAGNOSIS — C20 Malignant neoplasm of rectum: Secondary | ICD-10-CM

## 2019-06-20 MED ORDER — HEPARIN SOD (PORK) LOCK FLUSH 100 UNIT/ML IV SOLN
500.0000 [IU] | Freq: Once | INTRAVENOUS | Status: AC | PRN
  Administered 2019-06-20: 12:00:00 500 [IU]
  Filled 2019-06-20: qty 5

## 2019-06-20 MED ORDER — SODIUM CHLORIDE 0.9% FLUSH
10.0000 mL | INTRAVENOUS | Status: DC | PRN
  Administered 2019-06-20: 10 mL
  Filled 2019-06-20: qty 10

## 2019-07-01 ENCOUNTER — Other Ambulatory Visit: Payer: Self-pay

## 2019-07-01 ENCOUNTER — Telehealth: Payer: Self-pay | Admitting: *Deleted

## 2019-07-01 ENCOUNTER — Encounter: Payer: Self-pay | Admitting: *Deleted

## 2019-07-01 ENCOUNTER — Encounter: Payer: Self-pay | Admitting: Oncology

## 2019-07-01 NOTE — Progress Notes (Signed)
Patient pre screened no questions or concerns.

## 2019-07-01 NOTE — Patient Instructions (Signed)
FMLA forms have been completed for patients daughter [name]. Forms have been scanned to [facility] for Dr. Collie Siad signature and to be faxed.

## 2019-07-01 NOTE — Telephone Encounter (Signed)
Opened in error

## 2019-07-02 ENCOUNTER — Other Ambulatory Visit: Payer: Self-pay

## 2019-07-02 ENCOUNTER — Inpatient Hospital Stay (HOSPITAL_BASED_OUTPATIENT_CLINIC_OR_DEPARTMENT_OTHER): Payer: Medicare Other | Admitting: Oncology

## 2019-07-02 ENCOUNTER — Inpatient Hospital Stay: Payer: Medicare Other

## 2019-07-02 VITALS — BP 148/67 | HR 75 | Temp 96.7°F | Ht 65.0 in | Wt 198.0 lb

## 2019-07-02 DIAGNOSIS — E876 Hypokalemia: Secondary | ICD-10-CM | POA: Diagnosis not present

## 2019-07-02 DIAGNOSIS — Z5111 Encounter for antineoplastic chemotherapy: Secondary | ICD-10-CM | POA: Diagnosis not present

## 2019-07-02 DIAGNOSIS — T451X5A Adverse effect of antineoplastic and immunosuppressive drugs, initial encounter: Secondary | ICD-10-CM

## 2019-07-02 DIAGNOSIS — C20 Malignant neoplasm of rectum: Secondary | ICD-10-CM

## 2019-07-02 DIAGNOSIS — D6481 Anemia due to antineoplastic chemotherapy: Secondary | ICD-10-CM

## 2019-07-02 DIAGNOSIS — Z95828 Presence of other vascular implants and grafts: Secondary | ICD-10-CM

## 2019-07-02 DIAGNOSIS — D696 Thrombocytopenia, unspecified: Secondary | ICD-10-CM

## 2019-07-02 DIAGNOSIS — D701 Agranulocytosis secondary to cancer chemotherapy: Secondary | ICD-10-CM

## 2019-07-02 LAB — CBC WITH DIFFERENTIAL/PLATELET
Abs Immature Granulocytes: 0.01 10*3/uL (ref 0.00–0.07)
Basophils Absolute: 0 10*3/uL (ref 0.0–0.1)
Basophils Relative: 0 %
Eosinophils Absolute: 0.1 10*3/uL (ref 0.0–0.5)
Eosinophils Relative: 2 %
HCT: 33.9 % — ABNORMAL LOW (ref 36.0–46.0)
Hemoglobin: 11.2 g/dL — ABNORMAL LOW (ref 12.0–15.0)
Immature Granulocytes: 0 %
Lymphocytes Relative: 37 %
Lymphs Abs: 0.9 10*3/uL (ref 0.7–4.0)
MCH: 30.7 pg (ref 26.0–34.0)
MCHC: 33 g/dL (ref 30.0–36.0)
MCV: 92.9 fL (ref 80.0–100.0)
Monocytes Absolute: 0.5 10*3/uL (ref 0.1–1.0)
Monocytes Relative: 21 %
Neutro Abs: 1 10*3/uL — ABNORMAL LOW (ref 1.7–7.7)
Neutrophils Relative %: 40 %
Platelets: 134 10*3/uL — ABNORMAL LOW (ref 150–400)
RBC: 3.65 MIL/uL — ABNORMAL LOW (ref 3.87–5.11)
RDW: 15.1 % (ref 11.5–15.5)
WBC: 2.4 10*3/uL — ABNORMAL LOW (ref 4.0–10.5)
nRBC: 0 % (ref 0.0–0.2)

## 2019-07-02 LAB — COMPREHENSIVE METABOLIC PANEL
ALT: 14 U/L (ref 0–44)
AST: 27 U/L (ref 15–41)
Albumin: 4 g/dL (ref 3.5–5.0)
Alkaline Phosphatase: 37 U/L — ABNORMAL LOW (ref 38–126)
Anion gap: 8 (ref 5–15)
BUN: 7 mg/dL — ABNORMAL LOW (ref 8–23)
CO2: 26 mmol/L (ref 22–32)
Calcium: 9.5 mg/dL (ref 8.9–10.3)
Chloride: 104 mmol/L (ref 98–111)
Creatinine, Ser: 0.77 mg/dL (ref 0.44–1.00)
GFR calc Af Amer: >60 mL/min (ref >60)
GFR calc non Af Amer: >60 mL/min (ref >60)
Glucose, Bld: 97 mg/dL (ref 70–99)
Potassium: 3.6 mmol/L (ref 3.5–5.1)
Sodium: 138 mmol/L (ref 135–145)
Total Bilirubin: 0.4 mg/dL (ref 0.3–1.2)
Total Protein: 6.9 g/dL (ref 6.5–8.1)

## 2019-07-02 MED ORDER — DEXTROSE 5 % IV SOLN
Freq: Once | INTRAVENOUS | Status: AC
  Administered 2019-07-02: 10:00:00 via INTRAVENOUS
  Filled 2019-07-02: qty 250

## 2019-07-02 MED ORDER — HEPARIN SOD (PORK) LOCK FLUSH 100 UNIT/ML IV SOLN: 500.0000 [IU] | Freq: Once | INTRAVENOUS | Status: DC

## 2019-07-02 MED ORDER — OXALIPLATIN CHEMO INJECTION 100 MG/20ML
72.0000 mg/m2 | Freq: Once | INTRAVENOUS | Status: AC
  Administered 2019-07-02: 150 mg via INTRAVENOUS
  Filled 2019-07-02: qty 10

## 2019-07-02 MED ORDER — LEUCOVORIN CALCIUM INJECTION 350 MG
385.0000 mg/m2 | Freq: Once | INTRAVENOUS | Status: AC
  Administered 2019-07-02: 800 mg via INTRAVENOUS
  Filled 2019-07-02: qty 17.5

## 2019-07-02 MED ORDER — SODIUM CHLORIDE 0.9% FLUSH
10.0000 mL | INTRAVENOUS | Status: DC | PRN
  Administered 2019-07-02: 10 mL
  Filled 2019-07-02: qty 10

## 2019-07-02 MED ORDER — DEXAMETHASONE SODIUM PHOSPHATE 10 MG/ML IJ SOLN
10.0000 mg | Freq: Once | INTRAMUSCULAR | Status: AC
  Administered 2019-07-02: 10 mg via INTRAVENOUS
  Filled 2019-07-02: qty 1

## 2019-07-02 MED ORDER — SODIUM CHLORIDE 0.9 % IV SOLN
2400.0000 mg/m2 | INTRAVENOUS | Status: DC
  Administered 2019-07-02: 5000 mg via INTRAVENOUS
  Filled 2019-07-02: qty 100

## 2019-07-02 MED ORDER — SODIUM CHLORIDE 0.9% FLUSH
10.0000 mL | Freq: Once | INTRAVENOUS | Status: AC
  Administered 2019-07-02: 09:00:00 10 mL via INTRAVENOUS
  Filled 2019-07-02: qty 10

## 2019-07-02 MED ORDER — PALONOSETRON HCL INJECTION 0.25 MG/5ML
0.2500 mg | Freq: Once | INTRAVENOUS | Status: AC
  Administered 2019-07-02: 0.25 mg via INTRAVENOUS
  Filled 2019-07-02: qty 5

## 2019-07-03 LAB — CEA: CEA: 16.7 ng/mL — ABNORMAL HIGH (ref 0.0–4.7)

## 2019-07-03 NOTE — Progress Notes (Signed)
Hematology/Oncology progress note [facility] [phone] [phone]   Patient Care Team: Kirk Ruths, MD as PCP - General (Internal Medicine) Clent Jacks, RN as Registered Nurse  REFERRING PROVIDER: Dr.Secord CHIEF COMPLAINTS/REASON FOR VISIT:  Evaluation of endometrial cancer  HISTORY OF PRESENTING ILLNESS:  Jacqueline Yoder is a  73 y.o.  female with PMH listed below who was referred to me for evaluation of endometrial cancer.   Patient initially presented with complaints of postmenopausal bleeding on 08/16/2018.  History of was menopausal vaginal bleeding in 2016 which resulted in cervical polypectomy.  Pathology 02/04/2015 showed cervical polyp, consistent with benign endometrial polyp.  [name] after polypectomy due to anxiety associated with pelvic exams.  pelvic exam on 08/16/2018 reviewed cervical abnormality and from enlarged uterus. Seen by Dr. Marcelline Mates on 10/29/2018.  Endometrial biopsy and a Pap smear was performed. 10/29/2018 Pap smear showed adenocarcinoma, favor endometrial origin. 10/29/2018 endometrial biopsy showed endometrioid carcinoma, FIGO grade 1.  10/29/2018- TA & TV Ultrasound revealed: Anteverted uterus measuring 8.7 x 5.6 x 6.4 cm without evidence of focal masses.  The endometrium measuring 24.1 mm (thickened) and heterogeneous.  Right and left ovaries not visualized.  No adnexal masses identified.  No free fluid in cul-de-sac.  Patient was seen by Dr. Theora Gianotti in clinic on 11/13/2018.  Cervical exam reveals 2 cm exophytic irregular mass consistent with malignancy.   11/19/2018 CT chest abdomen pelvis with contrast showed thickened endometrium with some irregularity compatible with the provided diagnosis of endometrial malignancy.  There is a mildly prominent left inguinal node 1.4 cm.  Patient was seen by Dr. Fransisca Connors on 11/20/2018 and left groin lymph node biopsy was recommended.  11/26/2018 patient  underwent left inguinal lymph node biopsy. Pathology showed metastatic adenocarcinoma consistent with colorectal origin.  CDX 2+.  Case was discussed on tumor board.  Recommend colonoscopy for further evaluation.  Patient reports significant weight loss 30 pounds over the last year.  Chronic vaginal spotting. Change of bowel habits the past few months.  More constipated.  Family history positive for brother who has colon cancer prostate cancer.  patient has underwent colonoscopy on 12/03/2018 which reviewed a nonobstructing large mass in the rectum.  Also chronic fistula.  Mass was not circumferential.  This was biopsied with a cold forceps for histology.  Pathology came back hyperplastic polyp negative for dysplasia and malignancy. [name], patient underwent flex sigmoidoscopy on 12/06/2018 with rebiopsy of the rectal mass. This time biopsy results came back positive for invasive colorectal adenocarcinoma, moderately differentiated. Immunotherapy for nearly mismatch repair protein (MMR ) was performed.  There is no loss of MMR expression.  low probability of MSI high.   # Seen by [name] for evaluation of resectability for rectal cancer. In addition, she also had a second opinion with [facility] where her endometrial biopsy pathology was changed to  adenocarcinoma, consistent with colorectal primary.   [name]. She has home health that has been assisting with ostomy care  Patient was also evaluated by [facility] oncology.  Recommendation is to proceed with TNT with concurrent chemoradiation followed by neoadjuvant chemotherapy followed by surgical resection. Patient prefers to have treatment done locally with [facility].   # Oncology Treatment:  02/03/2019- 03/19/2019  concurrent Xeloda and radiation.  Xeloda dose 833m /m2 BID - rounded to 16588mBID- on days of radiation. 04/09/2019, started on FOLFOX with bolus 5-FU omitted.    INTERVAL HISTORY  Jacqueline Yoder is a 73 y.o. female who has above history reviewed by me today presents for evaluation after neoadjuvant chemotherapy FOLFOX for treatment of rectal cancer. became I am in the dorsal medial for quick Status post 6 cycles of FOLFOX.   Patient clinically doing well. She denies any new complaints today .  Denies any fever, chills, nausea, vomiting, or diarrhea. Appetite is fair. No concern for her colostomy site. Appetite is fair. Chronic fatigue no changes  Denies any numbness or tingling  Review of Systems  Constitutional: Positive for fatigue. Negative for appetite change, chills, fever and unexpected weight change.  HENT:   Negative for hearing loss and voice change.   Eyes: Negative for eye problems.  Respiratory: Negative for chest tightness and cough.   Cardiovascular: Negative for chest pain.  Gastrointestinal: Negative for abdominal distention, abdominal pain, blood in stool, constipation, diarrhea and nausea.       Colostomy bag  Endocrine: Negative for hot flashes.  Genitourinary: Negative for difficulty urinating and frequency.   Musculoskeletal: Negative for arthralgias.  Skin: Negative for itching and rash.  Neurological: Negative for extremity weakness.  Hematological: Negative for adenopathy.  Psychiatric/Behavioral: Negative for confusion.    MEDICAL HISTORY:  Past Medical History:  Diagnosis Date  . Arthritis   . Cancer ([place])    rectal  . GERD (gastroesophageal reflux disease)   . Hypercholesteremia   . Hypertension   . Hypertension   . Lower extremity edema   . Urinary incontinence     SURGICAL HISTORY: Past Surgical History:  Procedure Laterality Date  . CHOLECYSTECTOMY  1971  . COLONOSCOPY WITH PROPOFOL N/A 12/03/2018   Procedure: COLONOSCOPY WITH PROPOFOL;  Surgeon: Lucilla Lame, MD;  Location: [facility] ENDOSCOPY;  Service: Endoscopy;  Laterality: N/A;  . FLEXIBLE SIGMOIDOSCOPY N/A 12/06/2018   Procedure: FLEXIBLE  SIGMOIDOSCOPY;  Surgeon: [name], MD;  Location: [facility] ENDOSCOPY;  Service: Endoscopy;  Laterality: N/A;  . LAPAROSCOPIC COLOSTOMY  01/06/2019  . PORTACATH PLACEMENT N/A 04/03/2019   Procedure: INSERTION PORT-A-CATH;  Surgeon: Jules Husbands, MD;  Location: [facility] ORS;  Service: General;  Laterality: N/A;    SOCIAL HISTORY: Social History   Socioeconomic History  . Marital status: Married    Spouse name: Not on file  . Number of children: Not on file  . Years of education: Not on file  . Highest education level: Not on file  Occupational History  . Not on file  Social Needs  . Financial resource strain: Not on file  . Food insecurity    Worry: Not on file    Inability: Not on file  . Transportation needs    Medical: Not on file    Non-medical: Not on file  Tobacco Use  . Smoking status: Former Smoker    Quit date: 12/02/1977    Years since quitting: 41.6  . Smokeless tobacco: Never Used  Substance and Sexual Activity  . Alcohol use: Never    Frequency: Never  . Drug use: Never  . Sexual activity: Not Currently    Birth control/protection: None  Lifestyle  . Physical activity    Days per week: Not on file    Minutes per session: Not on file  . Stress: Not on file  Relationships  . Social Herbalist on phone: Not on file    Gets together: Not on file    Attends religious service: Not on file    Active member of club or organization: Not on  file    Attends meetings of clubs or organizations: Not on file    Relationship status: Not on file  . Intimate partner violence    Fear of current or ex partner: Not on file    Emotionally abused: Not on file    Physically abused: Not on file    Forced sexual activity: Not on file  Other Topics Concern  . Not on file  Social History Narrative  . Not on file    FAMILY HISTORY: Family History  Problem Relation Age of Onset  . Colon cancer Brother   . Prostate cancer Brother   . Hypertension Mother   . Stroke  Mother   . Kidney failure Father   . Breast cancer Neg Hx   . Ovarian cancer Neg Hx     ALLERGIES:  is allergic to sulfamethoxazole-trimethoprim.  MEDICATIONS:  Current Outpatient Medications  Medication Sig Dispense Refill  . chlorhexidine (PERIDEX) 0.12 % solution Use as directed 15 mLs in the mouth or throat 2 (two) times daily. 473 mL 3  . Cholecalciferol (VITAMIN D3) 2000 units capsule Take 2,000 Units by mouth daily.    . diclofenac sodium (VOLTAREN) 1 % GEL Apply 2 g topically 4 (four) times daily as needed (joint pain).   11  . docusate sodium (COLACE) 100 MG capsule Take 100 mg by mouth 2 (two) times daily.    . fexofenadine (ALLEGRA) 180 MG tablet Take 180 mg by mouth daily.    Marland Kitchen FIBER ADULT GUMMIES PO Take 1 Dose by mouth daily.    . fluticasone (FLONASE) 50 MCG/ACT nasal spray Place 1 spray into both nostrils 2 (two) times a day.     Marland Kitchen HYDROcodone-acetaminophen (NORCO/VICODIN) 5-325 MG tablet Take 1 tablet by mouth every 4 (four) hours as needed for moderate pain. 8 tablet 0  . ketorolac (ACULAR) 0.4 % SOLN Place 1 drop into both eyes daily.     Marland Kitchen lidocaine-prilocaine (EMLA) cream Apply 1 application topically as needed. 30 g 6  . loperamide (IMODIUM) 2 MG capsule Take 1 capsule (2 mg total) by mouth See admin instructions. With onset of loose stool, take 58m followed by 239mevery 2 hours,  Maximum: 16 mg/day 120 capsule 1  . Lutein 40 MG CAPS Take 40 mg by mouth daily.     . Lutein-Bilberry (BILBERRY PLUS LUTEIN) 02-999 MCG-MG CAPS Take 1 capsule by mouth daily.     . Multiple Vitamins-Minerals (ONE-A-DAY WOMENS 50 PLUS PO) Take 1 tablet by mouth daily.     . Marland Kitchenystatin (MYCOSTATIN/NYSTOP) powder Apply topically 4 (four) times daily. 30 g 0  . ondansetron (ZOFRAN) 8 MG tablet Take 1 tablet (8 mg total) by mouth every 8 (eight) hours as needed for nausea, vomiting or refractory nausea / vomiting. 90 tablet 1  . pantoprazole (PROTONIX) 40 MG tablet Take 1 tablet (40 mg total) by  mouth as needed. (Patient taking differently: Take 40 mg by mouth daily as needed (heartburn). ) 30 tablet 0  . potassium chloride SA (K-DUR) 20 MEQ tablet Take 20 mEq by mouth daily.    . prednisoLONE acetate (PRED FORTE) 1 % ophthalmic suspension Place 1 drop into both eyes 2 (two) times a day.   1  . prochlorperazine (COMPAZINE) 10 MG tablet Take 1 tablet (10 mg total) by mouth every 6 (six) hours as needed (Nausea or vomiting). 30 tablet 1  . simvastatin (ZOCOR) 40 MG tablet Take 40 mg by mouth at bedtime.     .Marland Kitchen  triamterene-hydrochlorothiazide (DYAZIDE) 37.5-25 MG capsule Take 1 capsule by mouth daily.      No current facility-administered medications for this visit.      PHYSICAL EXAMINATION: ECOG PERFORMANCE STATUS: 1 - Symptomatic but completely ambulatory Vitals:   07/02/19 0904  BP: (!) 148/67  Pulse: 75  Temp: (!) 96.7 F (35.9 C)   Filed Weights   07/02/19 0904  Weight: 198 lb (89.8 kg)    Physical Exam Constitutional:      General: She is not in acute distress. HENT:     Head: Normocephalic and atraumatic.  Eyes:     General: No scleral icterus.    Pupils: Pupils are equal, round, and reactive to light.  Neck:     Musculoskeletal: Normal range of motion and neck supple.  Cardiovascular:     Rate and Rhythm: Normal rate and regular rhythm.     Heart sounds: Normal heart sounds.  Pulmonary:     Effort: Pulmonary effort is normal. No respiratory distress.     Breath sounds: No wheezing.  Abdominal:     General: Bowel sounds are normal. There is no distension.     Palpations: Abdomen is soft. There is no mass.     Tenderness: There is no abdominal tenderness.     Comments: + colostomy bag with small amount of yellow color stool.  Musculoskeletal: Normal range of motion.        General: No deformity.  Skin:    General: Skin is warm and dry.     Findings: No erythema or rash.  Neurological:     Mental Status: She is alert and oriented to person, place, and  time.     Cranial Nerves: No cranial nerve deficit.     Coordination: Coordination normal.  Psychiatric:        Behavior: Behavior normal.        Thought Content: Thought content normal.    CMP Latest Ref Rng & Units 07/02/2019  Glucose 70 - 99 mg/dL 97  BUN 8 - 23 mg/dL 7(L)  Creatinine 0.44 - 1.00 mg/dL 0.77  Sodium 135 - 145 mmol/L 138  Potassium 3.5 - 5.1 mmol/L 3.6  Chloride 98 - 111 mmol/L 104  CO2 22 - 32 mmol/L 26  Calcium 8.9 - 10.3 mg/dL 9.5  Total Protein 6.5 - 8.1 g/dL 6.9  Total Bilirubin 0.3 - 1.2 mg/dL 0.4  Alkaline Phos 38 - 126 U/L 37(L)  AST 15 - 41 U/L 27  ALT 0 - 44 U/L 14   CBC Latest Ref Rng & Units 07/02/2019  WBC 4.0 - 10.5 K/uL 2.4(L)  Hemoglobin 12.0 - 15.0 g/dL 11.2(L)  Hematocrit 36.0 - 46.0 % 33.9(L)  Platelets 150 - 400 K/uL 134(L)    LABORATORY DATA:  I have reviewed the data as listed Lab Results  Component Value Date   WBC 2.4 (L) 07/02/2019   HGB 11.2 (L) 07/02/2019   HCT 33.9 (L) 07/02/2019   MCV 92.9 07/02/2019   PLT 134 (L) 07/02/2019   Recent Labs    06/04/19 0833 06/18/19 0821 07/02/19 0853  NA 140 138 138  K 3.5 3.1* 3.6  CL 104 103 104  CO2 _0 GLUCOSE 112* 109* 97  BUN 9 9 7*  CREATININE 0.92 0.78 0.77  CALCIUM 9.6 9.5 9.5  GFRNONAA >60 >60 >60  GFRAA >60 >60 >60  PROT 7.1 7.0 6.9  ALBUMIN 4.1 4.1 4.0  AST _1 ALT 14 13 14  ALKPHOS 37* 33* 37*  BILITOT 0.7 0.6 0.4   Iron/TIBC/Ferritin/ %Sat No results found for: IRON, TIBC, FERRITIN, IRONPCTSAT   RADIOGRAPHIC STUDIES: I have personally reviewed the radiological images as listed and agreed with the findings in the report. #11/19/2018 CT chest abdomen pelvis chest 1. Thickened endometrium with some irregularity compatible with the provided diagnosis of endometrial malignancy. There is a mildly prominent left inguinal lymph node at 1.4 cm in short axis which may reflect nodal spread. 2. Bilateral cystic renal lesions. We do not have precontrast images  in order to be able to assigned Bosniak classifications. No significant degree of de-enhancement to favor solid mass. 3. Aortic Atherosclerosis (ICD10-I70.0). Coronary atherosclerosis. Sigmoid colon diverticulosis, with several areas of mild colon wall thickening probably from contraction. Correlation with the patient's colon cancer screening history is recommended. If screening is not up-to-date, appropriate screening should be considered. 4. Lumbar impingement at L3-4 and L4-5.5. Esophageal reflux versus dysmotility.  ASSESSMENT & PLAN:  1. Rectal cancer ([place])   2. Anemia due to antineoplastic chemotherapy   3. Encounter for antineoplastic chemotherapy   4. Hypokalemia   5. Chemotherapy induced neutropenia ([facility])   Cancer Staging Rectal cancer [facility]) Staging form: Colon and Rectum, AJCC 8th Edition - Clinical stage from 01/23/2019: Stage IIIC (cT4b, cN1a, cM0) - Signed by Earlie Server, MD on 01/23/2019 #Stage IIIc rectal cancer Tolerating neoadjuvant chemotherapy FOLFOX/no bolus 5-FU with mild difficulties. Labs are reviewed and discussed with patient. Counts acceptable to proceed with cycle 7 FOLFOX today.-No 5-FU bolus.  #Chemotherapy-induced neutropenia, patient will have repeat CBC in 1 week and if ANC is less than 1, will proceed with G-CSF support with Zarxio.   #Chemotherapy-induced anemia and thrombocytopenia, stable.  Continue monitoring counts. Weight loss, follow-up with dietitian Hypokalemia, advised patient to continue oral potassium chloride 20 mEq daily.  #Family history with half brother having's history of colon cancer prostate cancer.  Personal history of colorectal cancer.  Patient has not decided if she wants genetic testing.  She will update me subconjunctival  Follow up in 2 weeks for evaluation prior to last cycles of FOLFOX.  Earlie Server, MD, PhD

## 2019-07-04 ENCOUNTER — Other Ambulatory Visit: Payer: Self-pay

## 2019-07-04 ENCOUNTER — Inpatient Hospital Stay: Payer: Medicare Other

## 2019-07-04 VITALS — BP 138/71 | HR 74 | Temp 97.7°F | Resp 20

## 2019-07-04 DIAGNOSIS — C20 Malignant neoplasm of rectum: Secondary | ICD-10-CM | POA: Diagnosis not present

## 2019-07-04 MED ORDER — HEPARIN SOD (PORK) LOCK FLUSH 100 UNIT/ML IV SOLN
500.0000 [IU] | Freq: Once | INTRAVENOUS | Status: AC | PRN
  Administered 2019-07-04: 500 [IU]

## 2019-07-08 ENCOUNTER — Other Ambulatory Visit: Payer: Self-pay

## 2019-07-09 ENCOUNTER — Inpatient Hospital Stay: Payer: Medicare Other

## 2019-07-09 ENCOUNTER — Other Ambulatory Visit: Payer: Self-pay

## 2019-07-09 DIAGNOSIS — C20 Malignant neoplasm of rectum: Secondary | ICD-10-CM

## 2019-07-09 LAB — CBC WITH DIFFERENTIAL/PLATELET
Abs Immature Granulocytes: 0.01 10*3/uL (ref 0.00–0.07)
Basophils Absolute: 0 10*3/uL (ref 0.0–0.1)
Basophils Relative: 0 %
Eosinophils Absolute: 0.1 10*3/uL (ref 0.0–0.5)
Eosinophils Relative: 2 %
HCT: 36.3 % (ref 36.0–46.0)
Hemoglobin: 11.5 g/dL — ABNORMAL LOW (ref 12.0–15.0)
Immature Granulocytes: 0 %
Lymphocytes Relative: 27 %
Lymphs Abs: 0.9 10*3/uL (ref 0.7–4.0)
MCH: 30.1 pg (ref 26.0–34.0)
MCHC: 31.7 g/dL (ref 30.0–36.0)
MCV: 95 fL (ref 80.0–100.0)
Monocytes Absolute: 0.4 10*3/uL (ref 0.1–1.0)
Monocytes Relative: 13 %
Neutro Abs: 1.9 10*3/uL (ref 1.7–7.7)
Neutrophils Relative %: 58 %
Platelets: 120 10*3/uL — ABNORMAL LOW (ref 150–400)
RBC: 3.82 MIL/uL — ABNORMAL LOW (ref 3.87–5.11)
RDW: 15 % (ref 11.5–15.5)
WBC: 3.3 10*3/uL — ABNORMAL LOW (ref 4.0–10.5)
nRBC: 0 % (ref 0.0–0.2)

## 2019-07-09 LAB — COMPREHENSIVE METABOLIC PANEL
ALT: 14 U/L (ref 0–44)
AST: 27 U/L (ref 15–41)
Albumin: 4.1 g/dL (ref 3.5–5.0)
Alkaline Phosphatase: 37 U/L — ABNORMAL LOW (ref 38–126)
Anion gap: 11 (ref 5–15)
BUN: 10 mg/dL (ref 8–23)
CO2: 27 mmol/L (ref 22–32)
Calcium: 9.5 mg/dL (ref 8.9–10.3)
Chloride: 102 mmol/L (ref 98–111)
Creatinine, Ser: 0.93 mg/dL (ref 0.44–1.00)
GFR calc Af Amer: >60 mL/min (ref >60)
GFR calc non Af Amer: >60 mL/min (ref >60)
Glucose, Bld: 93 mg/dL (ref 70–99)
Potassium: 3.6 mmol/L (ref 3.5–5.1)
Sodium: 140 mmol/L (ref 135–145)
Total Bilirubin: 0.3 mg/dL (ref 0.3–1.2)
Total Protein: 7.2 g/dL (ref 6.5–8.1)

## 2019-07-15 ENCOUNTER — Encounter: Payer: Self-pay | Admitting: Oncology

## 2019-07-15 ENCOUNTER — Other Ambulatory Visit: Payer: Self-pay

## 2019-07-15 NOTE — Progress Notes (Signed)
Patient stated that she had been doing well with no complaints. 

## 2019-07-16 ENCOUNTER — Inpatient Hospital Stay: Payer: Medicare Other

## 2019-07-16 ENCOUNTER — Inpatient Hospital Stay (HOSPITAL_BASED_OUTPATIENT_CLINIC_OR_DEPARTMENT_OTHER): Payer: Medicare Other | Admitting: Oncology

## 2019-07-16 ENCOUNTER — Other Ambulatory Visit: Payer: Self-pay

## 2019-07-16 VITALS — BP 123/65 | HR 80 | Temp 97.3°F | Ht 65.0 in | Wt 196.0 lb

## 2019-07-16 DIAGNOSIS — C20 Malignant neoplasm of rectum: Secondary | ICD-10-CM | POA: Diagnosis not present

## 2019-07-16 DIAGNOSIS — D6481 Anemia due to antineoplastic chemotherapy: Secondary | ICD-10-CM

## 2019-07-16 DIAGNOSIS — Z95828 Presence of other vascular implants and grafts: Secondary | ICD-10-CM

## 2019-07-16 DIAGNOSIS — Z5111 Encounter for antineoplastic chemotherapy: Secondary | ICD-10-CM | POA: Diagnosis not present

## 2019-07-16 DIAGNOSIS — T451X5A Adverse effect of antineoplastic and immunosuppressive drugs, initial encounter: Secondary | ICD-10-CM

## 2019-07-16 LAB — CBC WITH DIFFERENTIAL/PLATELET
Abs Immature Granulocytes: 0.01 10*3/uL (ref 0.00–0.07)
Basophils Absolute: 0 10*3/uL (ref 0.0–0.1)
Basophils Relative: 0 %
Eosinophils Absolute: 0.1 10*3/uL (ref 0.0–0.5)
Eosinophils Relative: 2 %
HCT: 33.2 % — ABNORMAL LOW (ref 36.0–46.0)
Hemoglobin: 10.8 g/dL — ABNORMAL LOW (ref 12.0–15.0)
Immature Granulocytes: 0 %
Lymphocytes Relative: 29 %
Lymphs Abs: 0.8 10*3/uL (ref 0.7–4.0)
MCH: 30.2 pg (ref 26.0–34.0)
MCHC: 32.5 g/dL (ref 30.0–36.0)
MCV: 92.7 fL (ref 80.0–100.0)
Monocytes Absolute: 0.4 10*3/uL (ref 0.1–1.0)
Monocytes Relative: 16 %
Neutro Abs: 1.4 10*3/uL — ABNORMAL LOW (ref 1.7–7.7)
Neutrophils Relative %: 53 %
Platelets: 139 10*3/uL — ABNORMAL LOW (ref 150–400)
RBC: 3.58 MIL/uL — ABNORMAL LOW (ref 3.87–5.11)
RDW: 15.4 % (ref 11.5–15.5)
WBC: 2.7 10*3/uL — ABNORMAL LOW (ref 4.0–10.5)
nRBC: 0 % (ref 0.0–0.2)

## 2019-07-16 LAB — COMPREHENSIVE METABOLIC PANEL
ALT: 13 U/L (ref 0–44)
AST: 27 U/L (ref 15–41)
Albumin: 3.9 g/dL (ref 3.5–5.0)
Alkaline Phosphatase: 36 U/L — ABNORMAL LOW (ref 38–126)
Anion gap: 7 (ref 5–15)
BUN: 8 mg/dL (ref 8–23)
CO2: 27 mmol/L (ref 22–32)
Calcium: 9.5 mg/dL (ref 8.9–10.3)
Chloride: 107 mmol/L (ref 98–111)
Creatinine, Ser: 0.87 mg/dL (ref 0.44–1.00)
GFR calc Af Amer: >60 mL/min (ref >60)
GFR calc non Af Amer: >60 mL/min (ref >60)
Glucose, Bld: 116 mg/dL — ABNORMAL HIGH (ref 70–99)
Potassium: 3.6 mmol/L (ref 3.5–5.1)
Sodium: 141 mmol/L (ref 135–145)
Total Bilirubin: 0.4 mg/dL (ref 0.3–1.2)
Total Protein: 6.8 g/dL (ref 6.5–8.1)

## 2019-07-16 MED ORDER — PALONOSETRON HCL INJECTION 0.25 MG/5ML
0.2500 mg | Freq: Once | INTRAVENOUS | Status: AC
  Administered 2019-07-16: 09:00:00 0.25 mg via INTRAVENOUS
  Filled 2019-07-16: qty 5

## 2019-07-16 MED ORDER — LEUCOVORIN CALCIUM INJECTION 350 MG
800.0000 mg | Freq: Once | INTRAVENOUS | Status: AC
  Administered 2019-07-16: 800 mg via INTRAVENOUS
  Filled 2019-07-16: qty 35

## 2019-07-16 MED ORDER — DEXTROSE 5 % IV SOLN
Freq: Once | INTRAVENOUS | Status: AC
  Administered 2019-07-16: 09:00:00 via INTRAVENOUS
  Filled 2019-07-16: qty 250

## 2019-07-16 MED ORDER — OXALIPLATIN CHEMO INJECTION 100 MG/20ML
150.0000 mg | Freq: Once | INTRAVENOUS | Status: AC
  Administered 2019-07-16: 150 mg via INTRAVENOUS
  Filled 2019-07-16: qty 20

## 2019-07-16 MED ORDER — DEXAMETHASONE SODIUM PHOSPHATE 10 MG/ML IJ SOLN
10.0000 mg | Freq: Once | INTRAMUSCULAR | Status: AC
  Administered 2019-07-16: 10 mg via INTRAVENOUS
  Filled 2019-07-16: qty 1

## 2019-07-16 MED ORDER — SODIUM CHLORIDE 0.9 % IV SOLN
2400.0000 mg/m2 | INTRAVENOUS | Status: DC
  Administered 2019-07-16: 5000 mg via INTRAVENOUS
  Filled 2019-07-16: qty 100

## 2019-07-16 NOTE — Progress Notes (Signed)
Hematology/Oncology progress note [facility] [phone] [phone]   Patient Care Team: Kirk Ruths, MD as PCP - General (Internal Medicine) Clent Jacks, RN as Registered Nurse  REFERRING PROVIDER: Dr.Secord CHIEF COMPLAINTS/REASON FOR VISIT:  Follow up for rectal cancer  HISTORY OF PRESENTING ILLNESS:  Jacqueline Yoder is a  73 y.o.  female with PMH listed below who was referred to me for evaluation of endometrial cancer.   Patient initially presented with complaints of postmenopausal bleeding on 08/16/2018.  History of was menopausal vaginal bleeding in 2016 which resulted in cervical polypectomy.  Pathology 02/04/2015 showed cervical polyp, consistent with benign endometrial polyp.  [name] after polypectomy due to anxiety associated with pelvic exams.  pelvic exam on 08/16/2018 reviewed cervical abnormality and from enlarged uterus. Seen by Dr. Marcelline Mates on 10/29/2018.  Endometrial biopsy and a Pap smear was performed. 10/29/2018 Pap smear showed adenocarcinoma, favor endometrial origin. 10/29/2018 endometrial biopsy showed endometrioid carcinoma, FIGO grade 1.  10/29/2018- TA & TV Ultrasound revealed: Anteverted uterus measuring 8.7 x 5.6 x 6.4 cm without evidence of focal masses.  The endometrium measuring 24.1 mm (thickened) and heterogeneous.  Right and left ovaries not visualized.  No adnexal masses identified.  No free fluid in cul-de-sac.  Patient was seen by Dr. Theora Gianotti in clinic on 11/13/2018.  Cervical exam reveals 2 cm exophytic irregular mass consistent with malignancy.   11/19/2018 CT chest abdomen pelvis with contrast showed thickened endometrium with some irregularity compatible with the provided diagnosis of endometrial malignancy.  There is a mildly prominent left inguinal node 1.4 cm.  Patient was seen by Dr. Fransisca Connors on 11/20/2018 and left groin lymph node biopsy was recommended.  11/26/2018 patient underwent  left inguinal lymph node biopsy. Pathology showed metastatic adenocarcinoma consistent with colorectal origin.  CDX 2+.  Case was discussed on tumor board.  Recommend colonoscopy for further evaluation.  Patient reports significant weight loss 30 pounds over the last year.  Chronic vaginal spotting. Change of bowel habits the past few months.  More constipated.  Family history positive for brother who has colon cancer prostate cancer.  patient has underwent colonoscopy on 12/03/2018 which reviewed a nonobstructing large mass in the rectum.  Also chronic fistula.  Mass was not circumferential.  This was biopsied with a cold forceps for histology.  Pathology came back hyperplastic polyp negative for dysplasia and malignancy. [name], patient underwent flex sigmoidoscopy on 12/06/2018 with rebiopsy of the rectal mass. This time biopsy results came back positive for invasive colorectal adenocarcinoma, moderately differentiated. Immunotherapy for nearly mismatch repair protein (MMR ) was performed.  There is no loss of MMR expression.  low probability of MSI high.   # Seen by [name] for evaluation of resectability for rectal cancer. In addition, she also had a second opinion with [facility] pathology where her endometrial biopsy pathology was changed to  adenocarcinoma, consistent with colorectal primary.   [name]. She has home health that has been assisting with ostomy care  Patient was also evaluated by [facility] oncology.  Recommendation is to proceed with TNT with concurrent chemoradiation followed by neoadjuvant chemotherapy followed by surgical resection. Patient prefers to have treatment done locally with [facility].   # Oncology Treatment:  02/03/2019- 03/19/2019  concurrent Xeloda and radiation.  Xeloda dose 849m /m2 BID - rounded to 16562mBID- on days of radiation. 04/09/2019, started on FOLFOX with bolus 5-FU omitted.  07/16/2019  finished  8 cycles of FOLFOX.  INTERVAL HISTORY Jacqueline Yoder is a 73 y.o. female who has above history reviewed by me today presents for evaluation after neoadjuvant chemotherapy FOLFOX for treatment of rectal cancer. became I am in the dorsal medial for quick Status post 7 cycles of FOLFOX.   Patient reports clinically doing well.  No concerns of her ostomy sites.   She started to have some tingling and numbness of fingertips, intermittent. Denies any fever, chills, nausea, vomiting, or diarrhea. Appetite is fair Chronic fatigue no changes.  Review of Systems  Constitutional: Positive for fatigue. Negative for appetite change, chills, fever and unexpected weight change.  HENT:   Negative for hearing loss and voice change.   Eyes: Negative for eye problems.  Respiratory: Negative for chest tightness and cough.   Cardiovascular: Negative for chest pain.  Gastrointestinal: Negative for abdominal distention, abdominal pain, blood in stool, constipation, diarrhea and nausea.       Colostomy bag  Endocrine: Negative for hot flashes.  Genitourinary: Negative for difficulty urinating and frequency.   Musculoskeletal: Negative for arthralgias.  Skin: Negative for itching and rash.  Neurological: Negative for extremity weakness.  Hematological: Negative for adenopathy.  Psychiatric/Behavioral: Negative for confusion.    MEDICAL HISTORY:  Past Medical History:  Diagnosis Date   Arthritis    Cancer ([place])    rectal   GERD (gastroesophageal reflux disease)    Hypercholesteremia    Hypertension    Hypertension    Lower extremity edema    Urinary incontinence     SURGICAL HISTORY: Past Surgical History:  Procedure Laterality Date   CHOLECYSTECTOMY  1971   COLONOSCOPY WITH PROPOFOL N/A 12/03/2018   Procedure: COLONOSCOPY WITH PROPOFOL;  Surgeon: Lucilla Lame, MD;  Location: [facility] ENDOSCOPY;  Service: Endoscopy;  Laterality: N/A;   FLEXIBLE SIGMOIDOSCOPY N/A 12/06/2018    Procedure: FLEXIBLE SIGMOIDOSCOPY;  Surgeon: [name], MD;  Location: [facility] ENDOSCOPY;  Service: Endoscopy;  Laterality: N/A;   LAPAROSCOPIC COLOSTOMY  01/06/2019   PORTACATH PLACEMENT N/A 04/03/2019   Procedure: INSERTION PORT-A-CATH;  Surgeon: Jules Husbands, MD;  Location: [facility] ORS;  Service: General;  Laterality: N/A;    SOCIAL HISTORY: Social History   Socioeconomic History   Marital status: Married    Spouse name: Not on file   Number of children: Not on file   Years of education: Not on file   Highest education level: Not on file  Occupational History   Not on file  Social Needs   Financial resource strain: Not on file   Food insecurity    Worry: Not on file    Inability: Not on file   Transportation needs    Medical: Not on file    Non-medical: Not on file  Tobacco Use   Smoking status: Former Smoker    Quit date: 12/02/1977    Years since quitting: 41.6   Smokeless tobacco: Never Used  Substance and Sexual Activity   Alcohol use: Never    Frequency: Never   Drug use: Never   Sexual activity: Not Currently    Birth control/protection: None  Lifestyle   Physical activity    Days per week: Not on file    Minutes per session: Not on file   Stress: Not on file  Relationships   Social connections    Talks on phone: Not on file    Gets together: Not on file    Attends religious service: Not on file    Active member of  club or organization: Not on file    Attends meetings of clubs or organizations: Not on file    Relationship status: Not on file   Intimate partner violence    Fear of current or ex partner: Not on file    Emotionally abused: Not on file    Physically abused: Not on file    Forced sexual activity: Not on file  Other Topics Concern   Not on file  Social History Narrative   Not on file    FAMILY HISTORY: Family History  Problem Relation Age of Onset   Colon cancer Brother    Prostate cancer Brother    Hypertension  Mother    Stroke Mother    Kidney failure Father    Breast cancer Neg Hx    Ovarian cancer Neg Hx     ALLERGIES:  is allergic to sulfamethoxazole-trimethoprim.  MEDICATIONS:  Current Outpatient Medications  Medication Sig Dispense Refill   chlorhexidine (PERIDEX) 0.12 % solution Use as directed 15 mLs in the mouth or throat 2 (two) times daily. 473 mL 3   Cholecalciferol (VITAMIN D3) 2000 units capsule Take 2,000 Units by mouth daily.     diclofenac sodium (VOLTAREN) 1 % GEL Apply 2 g topically 4 (four) times daily as needed (joint pain).   11   docusate sodium (COLACE) 100 MG capsule Take 100 mg by mouth 2 (two) times daily.     fexofenadine (ALLEGRA) 180 MG tablet Take 180 mg by mouth daily.     FIBER ADULT GUMMIES PO Take 1 Dose by mouth daily.     fluticasone (FLONASE) 50 MCG/ACT nasal spray Place 1 spray into both nostrils 2 (two) times a day.      HYDROcodone-acetaminophen (NORCO/VICODIN) 5-325 MG tablet Take 1 tablet by mouth every 4 (four) hours as needed for moderate pain. 8 tablet 0   ketorolac (ACULAR) 0.4 % SOLN Place 1 drop into both eyes daily.      lidocaine-prilocaine (EMLA) cream Apply 1 application topically as needed. 30 g 6   loperamide (IMODIUM) 2 MG capsule Take 1 capsule (2 mg total) by mouth See admin instructions. With onset of loose stool, take 56m followed by 282mevery 2 hours,  Maximum: 16 mg/day 120 capsule 1   Lutein 40 MG CAPS Take 40 mg by mouth daily.      Lutein-Bilberry (BILBERRY PLUS LUTEIN) 02-999 MCG-MG CAPS Take 1 capsule by mouth daily.      Multiple Vitamins-Minerals (ONE-A-DAY WOMENS 50 PLUS PO) Take 1 tablet by mouth daily.      nystatin (MYCOSTATIN/NYSTOP) powder Apply topically 4 (four) times daily. 30 g 0   ondansetron (ZOFRAN) 8 MG tablet Take 1 tablet (8 mg total) by mouth every 8 (eight) hours as needed for nausea, vomiting or refractory nausea / vomiting. 90 tablet 1   pantoprazole (PROTONIX) 40 MG tablet Take 1 tablet  (40 mg total) by mouth as needed. (Patient taking differently: Take 40 mg by mouth daily as needed (heartburn). ) 30 tablet 0   potassium chloride SA (K-DUR) 20 MEQ tablet Take 20 mEq by mouth daily.     prednisoLONE acetate (PRED FORTE) 1 % ophthalmic suspension Place 1 drop into both eyes 2 (two) times a day.   1   prochlorperazine (COMPAZINE) 10 MG tablet Take 1 tablet (10 mg total) by mouth every 6 (six) hours as needed (Nausea or vomiting). 30 tablet 1   simvastatin (ZOCOR) 40 MG tablet Take 40 mg by mouth at  bedtime.      triamterene-hydrochlorothiazide (DYAZIDE) 37.5-25 MG capsule Take 1 capsule by mouth daily.      No current facility-administered medications for this visit.      PHYSICAL EXAMINATION: ECOG PERFORMANCE STATUS: 1 - Symptomatic but completely ambulatory Vitals:   07/16/19 0834  BP: 123/65  Pulse: 80  Temp: (!) 97.3 F (36.3 C)   Filed Weights   07/16/19 0834  Weight: 196 lb (88.9 kg)    Physical Exam Constitutional:      General: She is not in acute distress. HENT:     Head: Normocephalic and atraumatic.  Eyes:     General: No scleral icterus.    Pupils: Pupils are equal, round, and reactive to light.  Neck:     Musculoskeletal: Normal range of motion and neck supple.  Cardiovascular:     Rate and Rhythm: Normal rate and regular rhythm.     Heart sounds: Normal heart sounds.  Pulmonary:     Effort: Pulmonary effort is normal. No respiratory distress.     Breath sounds: No wheezing.  Abdominal:     General: Bowel sounds are normal. There is no distension.     Palpations: Abdomen is soft. There is no mass.     Tenderness: There is no abdominal tenderness.     Comments: + colostomy bag with small amount of yellow color stool.  Musculoskeletal: Normal range of motion.        General: No deformity.  Skin:    General: Skin is warm and dry.     Findings: No erythema or rash.  Neurological:     Mental Status: She is alert and oriented to person,  place, and time.     Cranial Nerves: No cranial nerve deficit.     Coordination: Coordination normal.  Psychiatric:        Behavior: Behavior normal.        Thought Content: Thought content normal.    CMP Latest Ref Rng & Units 07/16/2019  Glucose 70 - 99 mg/dL 116(H)  BUN 8 - 23 mg/dL 8  Creatinine 0.44 - 1.00 mg/dL 0.87  Sodium 135 - 145 mmol/L 141  Potassium 3.5 - 5.1 mmol/L 3.6  Chloride 98 - 111 mmol/L 107  CO2 22 - 32 mmol/L 27  Calcium 8.9 - 10.3 mg/dL 9.5  Total Protein 6.5 - 8.1 g/dL 6.8  Total Bilirubin 0.3 - 1.2 mg/dL 0.4  Alkaline Phos 38 - 126 U/L 36(L)  AST 15 - 41 U/L 27  ALT 0 - 44 U/L 13   CBC Latest Ref Rng & Units 07/16/2019  WBC 4.0 - 10.5 K/uL 2.7(L)  Hemoglobin 12.0 - 15.0 g/dL 10.8(L)  Hematocrit 36.0 - 46.0 % 33.2(L)  Platelets 150 - 400 K/uL 139(L)    LABORATORY DATA:  I have reviewed the data as listed Lab Results  Component Value Date   WBC 2.7 (L) 07/16/2019   HGB 10.8 (L) 07/16/2019   HCT 33.2 (L) 07/16/2019   MCV 92.7 07/16/2019   PLT 139 (L) 07/16/2019   Recent Labs    07/02/19 0853 07/09/19 1000 07/16/19 0803  NA 138 140 141  K 3.6 3.6 3.6  CL 104 102 107  CO2 '26 27 27  ' GLUCOSE 97 93 116*  BUN 7* 10 8  CREATININE 0.77 0.93 0.87  CALCIUM 9.5 9.5 9.5  GFRNONAA >60 >60 >60  GFRAA >60 >60 >60  PROT 6.9 7.2 6.8  ALBUMIN 4.0 4.1 3.9  AST 27 27 27  ALT '14 14 13  ' ALKPHOS 37* 37* 36*  BILITOT 0.4 0.3 0.4   Iron/TIBC/Ferritin/ %Sat No results found for: IRON, TIBC, FERRITIN, IRONPCTSAT   RADIOGRAPHIC STUDIES: I have personally reviewed the radiological images as listed and agreed with the findings in the report. #11/19/2018 CT chest abdomen pelvis chest 1. Thickened endometrium with some irregularity compatible with the provided diagnosis of endometrial malignancy. There is a mildly prominent left inguinal lymph node at 1.4 cm in short axis which may reflect nodal spread. 2. Bilateral cystic renal lesions. We do not have  precontrast images in order to be able to assigned Bosniak classifications. No significant degree of de-enhancement to favor solid mass. 3. Aortic Atherosclerosis (ICD10-I70.0). Coronary atherosclerosis. Sigmoid colon diverticulosis, with several areas of mild colon wall thickening probably from contraction. Correlation with the patient's colon cancer screening history is recommended. If screening is not up-to-date, appropriate screening should be considered. 4. Lumbar impingement at L3-4 and L4-5.5. Esophageal reflux versus dysmotility.  ASSESSMENT & PLAN:  1. Rectal cancer ([place])   2. Anemia due to antineoplastic chemotherapy   3. Encounter for antineoplastic chemotherapy   4. Port-A-Cath in place   Cancer Staging Rectal cancer [facility]) Staging form: Colon and Rectum, AJCC 8th Edition - Clinical stage from 01/23/2019: Stage IIIC (cT4b, cN1a, cM0) - Signed by Earlie Server, MD on 01/23/2019 #Stage IIIc rectal cancer Tolerating neoadjuvant chemotherapy FOLFOX/no bolus 5-FU with mild difficulties. Labs are reviewed and discussed with patient. Counts acceptable to proceed with  FOLFOX treatment today. She will finish total of 8 cycles of neoadjuvant FOLFOX treatments today.  #Chemotherapy-induced neutropenia,  ANC 1.4 today.  Stable.  #Chemotherapy-induced anemia and thrombocytopenia, stable.  Continue to monitor.  Hypokalemia, potassium is stable.  Continue take oral potassium supplementation 20 mEq daily.   #Family history with half brother having's history of colon cancer prostate cancer.  Personal history of colorectal cancer.  Patient has not decided if she wants genetic testing.  She will update me subconjunctival  #Port-A-Cath in place, recommend patient to flush port every 6 to 8 weeks.  Follow up to be determined.  Patient has appointment with [name] in mid October for preop evaluation.  I will defer [facility] surgeon for preop image evaluation.  Earlie Server, MD, PhD

## 2019-07-18 ENCOUNTER — Inpatient Hospital Stay: Payer: Medicare Other | Attending: Oncology

## 2019-07-18 ENCOUNTER — Other Ambulatory Visit: Payer: Self-pay

## 2019-07-18 DIAGNOSIS — C20 Malignant neoplasm of rectum: Secondary | ICD-10-CM

## 2019-07-18 MED ORDER — SODIUM CHLORIDE 0.9% FLUSH
10.0000 mL | INTRAVENOUS | Status: DC | PRN
  Administered 2019-07-18: 10 mL via INTRAVENOUS
  Filled 2019-07-18: qty 10

## 2019-07-18 MED ORDER — HEPARIN SOD (PORK) LOCK FLUSH 100 UNIT/ML IV SOLN
500.0000 [IU] | Freq: Once | INTRAVENOUS | Status: AC
  Administered 2019-07-18: 500 [IU] via INTRAVENOUS
  Filled 2019-07-18: qty 5

## 2019-08-20 DIAGNOSIS — E65 Localized adiposity: Secondary | ICD-10-CM | POA: Insufficient documentation

## 2019-09-01 DIAGNOSIS — N823 Fistula of vagina to large intestine: Secondary | ICD-10-CM | POA: Insufficient documentation

## 2019-09-01 DIAGNOSIS — C799 Secondary malignant neoplasm of unspecified site: Secondary | ICD-10-CM | POA: Insufficient documentation

## 2019-09-03 ENCOUNTER — Other Ambulatory Visit: Payer: Self-pay

## 2019-09-04 ENCOUNTER — Other Ambulatory Visit: Payer: Self-pay

## 2019-09-04 ENCOUNTER — Encounter: Payer: Self-pay | Admitting: Radiation Oncology

## 2019-09-04 ENCOUNTER — Ambulatory Visit
Admission: RE | Admit: 2019-09-04 | Discharge: 2019-09-04 | Disposition: A | Discharge status: Home / Self Care | Payer: Medicare Other | Source: Ambulatory Visit | Attending: Radiation Oncology | Admitting: Radiation Oncology

## 2019-09-04 DIAGNOSIS — Z933 Colostomy status: Secondary | ICD-10-CM | POA: Insufficient documentation

## 2019-09-04 DIAGNOSIS — C20 Malignant neoplasm of rectum: Secondary | ICD-10-CM | POA: Diagnosis present

## 2019-09-04 DIAGNOSIS — Z923 Personal history of irradiation: Secondary | ICD-10-CM | POA: Insufficient documentation

## 2019-09-04 DIAGNOSIS — Z9221 Personal history of antineoplastic chemotherapy: Secondary | ICD-10-CM | POA: Insufficient documentation

## 2019-09-04 NOTE — Progress Notes (Signed)
Met with Jacqueline Yoder following her appointment with Dr. Baruch Gouty. She has her surgery arranged for 09/17/19. Dr. Tasia Catchings would like to see her 2-3 weeks following surgery. Appointment has been arranged for 12/21. Appointment also provided in AVS.

## 2019-09-04 NOTE — Progress Notes (Signed)
Radiation Oncology Follow up Note  Name: MAANVI LECOMPTE   Date:   09/04/2019 MRN:  [id number] DOB: July 03, 1946    This 73 y.o. female presents to the clinic today for 56-monthfollow-up status post concurrent chemoradiation therapy in a neoadjuvant fashion in preparation for surgical removal of locally advanced adenocarcinoma the rectum.  Colorectal cancer with stage IIIc (T4BN1 a M0  REFERRING PROVIDER: AKirk Ruths MD  HPI: Patient is a 73year old female now out 5 months having completed concurrent chemoradiation therapy for neoadjuvant treatment of locally advanced adenocarcinoma of the rectum.  She had a left groin lymph node which was positive for adenocarcinoma consistent with rectal origin.  Tumor was moderately differentiated adenocarcinoma there was no loss of MMR expression.  Her endometrial biopsy was positive for colorectal primary.  She is diversion colostomy then completed concurrent chemoradiation therapy she then underwent adjuvant FOLFOX chemotherapy and [facility]  They plan on doing a TAH/BSO as well as resection of her rectal mass.  She is currently doing well.  Her colostomy is functioning well she specifically denies any increased lower urinary tract symptoms..  COMPLICATIONS OF TREATMENT: none  FOLLOW UP COMPLIANCE: keeps appointments   PHYSICAL EXAM:  BP (!) (P) 129/58 (BP Location: Left Arm)   Pulse (P) 77   Temp (!) (P) 97.3 F (36.3 C) (Tympanic)   Resp (P) 16   Wt (P) 199 lb 11.2 oz (90.6 kg)   BMI (P) 33.23 kg/m  Functioning colostomy.  Well-developed well-nourished patient in NAD. HEENT reveals PERLA, EOMI, discs not visualized.  Oral cavity is clear. No oral mucosal lesions are identified. Neck is clear without evidence of cervical or supraclavicular adenopathy. Lungs are clear to A&P. Cardiac examination is essentially unremarkable with regular rate and rhythm without murmur rub or thrill. Abdomen is benign with  no organomegaly or masses noted. Motor sensory and DTR levels are equal and symmetric in the upper and lower extremities. Cranial nerves II through XII are grossly intact. Proprioception is intact. No peripheral adenopathy or edema is identified. No motor or sensory levels are noted. Crude visual fields are within normal range.  RADIOLOGY RESULTS: No current films to review  PLAN: At the present time patient is doing well she is completed all neoadjuvant chemoradiation therapy as well as FOLFOX.  She is scheduled for early December to have resection including her uterus tubes ovaries and rectum [facility]  At this time of asked to see her back in 3 months for follow-up and like to review her pathology at that time.  Patient otherwise is doing well patient knows to call with any concerns.  I would like to take this opportunity to thank you for allowing me to participate in the care of your patient..Noreene Filbert MD

## 2019-10-03 ENCOUNTER — Other Ambulatory Visit: Payer: Self-pay

## 2019-10-03 NOTE — Progress Notes (Signed)
Patient pre screened for office appointment, no questions or concerns today. Patient reminded of upcoming appointment time and date. Patient asleep at time of call daughter supplied information for chart.

## 2019-10-06 ENCOUNTER — Inpatient Hospital Stay: Payer: Medicare Other | Attending: Oncology | Admitting: Oncology

## 2019-10-06 ENCOUNTER — Other Ambulatory Visit: Payer: Self-pay

## 2019-10-06 ENCOUNTER — Encounter: Payer: Self-pay | Admitting: Oncology

## 2019-10-06 ENCOUNTER — Inpatient Hospital Stay: Payer: Medicare Other

## 2019-10-06 VITALS — BP 112/74 | HR 102 | Temp 97.3°F | Resp 18 | Wt 179.3 lb

## 2019-10-06 DIAGNOSIS — I10 Essential (primary) hypertension: Secondary | ICD-10-CM | POA: Diagnosis not present

## 2019-10-06 DIAGNOSIS — E7800 Pure hypercholesterolemia, unspecified: Secondary | ICD-10-CM | POA: Diagnosis not present

## 2019-10-06 DIAGNOSIS — G629 Polyneuropathy, unspecified: Secondary | ICD-10-CM

## 2019-10-06 DIAGNOSIS — Z95828 Presence of other vascular implants and grafts: Secondary | ICD-10-CM

## 2019-10-06 DIAGNOSIS — C774 Secondary and unspecified malignant neoplasm of inguinal and lower limb lymph nodes: Secondary | ICD-10-CM | POA: Insufficient documentation

## 2019-10-06 DIAGNOSIS — M1990 Unspecified osteoarthritis, unspecified site: Secondary | ICD-10-CM | POA: Diagnosis not present

## 2019-10-06 DIAGNOSIS — I700 Atherosclerosis of aorta: Secondary | ICD-10-CM | POA: Diagnosis not present

## 2019-10-06 DIAGNOSIS — Z8042 Family history of malignant neoplasm of prostate: Secondary | ICD-10-CM | POA: Insufficient documentation

## 2019-10-06 DIAGNOSIS — Z87891 Personal history of nicotine dependence: Secondary | ICD-10-CM | POA: Insufficient documentation

## 2019-10-06 DIAGNOSIS — K219 Gastro-esophageal reflux disease without esophagitis: Secondary | ICD-10-CM | POA: Diagnosis not present

## 2019-10-06 DIAGNOSIS — R634 Abnormal weight loss: Secondary | ICD-10-CM | POA: Diagnosis not present

## 2019-10-06 DIAGNOSIS — Z452 Encounter for adjustment and management of vascular access device: Secondary | ICD-10-CM | POA: Diagnosis not present

## 2019-10-06 DIAGNOSIS — R9389 Abnormal findings on diagnostic imaging of other specified body structures: Secondary | ICD-10-CM | POA: Diagnosis not present

## 2019-10-06 DIAGNOSIS — C20 Malignant neoplasm of rectum: Secondary | ICD-10-CM | POA: Diagnosis not present

## 2019-10-06 DIAGNOSIS — I2510 Atherosclerotic heart disease of native coronary artery without angina pectoris: Secondary | ICD-10-CM | POA: Insufficient documentation

## 2019-10-06 DIAGNOSIS — K5900 Constipation, unspecified: Secondary | ICD-10-CM | POA: Insufficient documentation

## 2019-10-06 DIAGNOSIS — Z79899 Other long term (current) drug therapy: Secondary | ICD-10-CM | POA: Diagnosis not present

## 2019-10-06 DIAGNOSIS — Z791 Long term (current) use of non-steroidal anti-inflammatories (NSAID): Secondary | ICD-10-CM | POA: Diagnosis not present

## 2019-10-06 DIAGNOSIS — Z800 Family history of malignant neoplasm of digestive organs: Secondary | ICD-10-CM | POA: Diagnosis not present

## 2019-10-06 MED ORDER — SODIUM CHLORIDE 0.9% FLUSH
10.0000 mL | Freq: Once | INTRAVENOUS | Status: AC
  Administered 2019-10-06: 15:00:00 10 mL via INTRAVENOUS
  Filled 2019-10-06: qty 10

## 2019-10-06 MED ORDER — GABAPENTIN 100 MG PO CAPS: 100.0000 mg | ORAL_CAPSULE | Freq: Every day | ORAL | 0 refills | Status: DC

## 2019-10-06 MED ORDER — HEPARIN SOD (PORK) LOCK FLUSH 100 UNIT/ML IV SOLN
500.0000 [IU] | Freq: Once | INTRAVENOUS | Status: AC
  Administered 2019-10-06: 15:00:00 500 [IU] via INTRAVENOUS
  Filled 2019-10-06: qty 5

## 2019-10-06 NOTE — Progress Notes (Signed)
Hematology/Oncology progress note [facility] Telephone:([phone] [phone]   Patient Care Team: Kirk Ruths, MD as PCP - General (Internal Medicine) Clent Jacks, RN as Registered Nurse  REFERRING PROVIDER: Dr.Secord CHIEF COMPLAINTS/REASON FOR VISIT:  Follow up for rectal cancer  HISTORY OF PRESENTING ILLNESS:  Jacqueline Yoder is a  73 y.o.  female with PMH listed below who was referred to me for evaluation of endometrial cancer.   Patient initially presented with complaints of postmenopausal bleeding on 08/16/2018.  History of was menopausal vaginal bleeding in 2016 which resulted in cervical polypectomy.  Pathology 02/04/2015 showed cervical polyp, consistent with benign endometrial polyp.  [name] after polypectomy due to anxiety associated with pelvic exams.  pelvic exam on 08/16/2018 reviewed cervical abnormality and from enlarged uterus. Seen by Dr. Marcelline Mates on 10/29/2018.  Endometrial biopsy and a Pap smear was performed. 10/29/2018 Pap smear showed adenocarcinoma, favor endometrial origin. 10/29/2018 endometrial biopsy showed endometrioid carcinoma, FIGO grade 1.  10/29/2018- TA & TV Ultrasound revealed: Anteverted uterus measuring 8.7 x 5.6 x 6.4 cm without evidence of focal masses.  The endometrium measuring 24.1 mm (thickened) and heterogeneous.  Right and left ovaries not visualized.  No adnexal masses identified.  No free fluid in cul-de-sac.  Patient was seen by Dr. Theora Gianotti in clinic on 11/13/2018.  Cervical exam reveals 2 cm exophytic irregular mass consistent with malignancy.   11/19/2018 CT chest abdomen pelvis with contrast showed thickened endometrium with some irregularity compatible with the provided diagnosis of endometrial malignancy.  There is a mildly prominent left inguinal node 1.4 cm.  Patient was seen by Dr. Fransisca Connors on 11/20/2018 and left groin lymph node biopsy was recommended.  11/26/2018 patient underwent  left inguinal lymph node biopsy. Pathology showed metastatic adenocarcinoma consistent with colorectal origin.  CDX 2+.  Case was discussed on tumor board.  Recommend colonoscopy for further evaluation.  Patient reports significant weight loss 30 pounds over the last year.  Chronic vaginal spotting. Change of bowel habits the past few months.  More constipated.  Family history positive for brother who has colon cancer prostate cancer.  patient has underwent colonoscopy on 12/03/2018 which reviewed a nonobstructing large mass in the rectum.  Also chronic fistula.  Mass was not circumferential.  This was biopsied with a cold forceps for histology.  Pathology came back hyperplastic polyp negative for dysplasia and malignancy. [name], patient underwent flex sigmoidoscopy on 12/06/2018 with rebiopsy of the rectal mass. This time biopsy results came back positive for invasive colorectal adenocarcinoma, moderately differentiated. Immunotherapy for nearly mismatch repair protein (MMR ) was performed.  There is no loss of MMR expression.  low probability of MSI high.   # Seen by [name] for evaluation of resectability for rectal cancer. In addition, she also had a second opinion with [facility] where her endometrial biopsy pathology was changed to  adenocarcinoma, consistent with colorectal primary.   [name]. She has home health that has been assisting with ostomy care  Patient was also evaluated by [facility] oncology.  Recommendation is to proceed with TNT with concurrent chemoradiation followed by neoadjuvant chemotherapy followed by surgical resection. Patient prefers to have treatment done locally with [facility].   # Oncology Treatment:  02/03/2019- 03/19/2019  concurrent Xeloda and radiation.  Xeloda dose 884m /m2 BID - rounded to 16557mBID- on days of radiation. 04/09/2019, started on FOLFOX with bolus 5-FU omitted.  07/16/2019  finished  8 cycles of FOLFOX.  INTERVAL HISTORY Jacqueline Yoder is a 73 y.o. female who has above history reviewed by me today presents for follow up.  Patient has underwent 09/17/19 APR/posterior vaginectomy/TAH/BSO/VY-flap pT4b pN0 with close vaginal margin 0.2 mm.  Uterus and ovaries negative for malignancy. Patient reports bilateral lower extremity numbness and tingling, intermittent, left worse than right. She has lost a lot of weight since her APR surgery.  20 pound weight loss since last documented in September 2020. Appetite is improving. She will follow up with [name] for removal of stitches.  She denies any concerns about her surgical scar. Colostomy is functioning well.  Review of Systems  Constitutional: Positive for fatigue and unexpected weight change. Negative for chills and fever.  HENT:   Negative for hearing loss and voice change.   Eyes: Negative for eye problems.  Respiratory: Negative for chest tightness and cough.   Cardiovascular: Negative for chest pain.  Gastrointestinal: Negative for abdominal distention, abdominal pain, blood in stool, constipation, diarrhea and nausea.       Colostomy bag  Endocrine: Negative for hot flashes.  Genitourinary: Negative for difficulty urinating and frequency.   Musculoskeletal: Negative for arthralgias.  Skin: Negative for itching and rash.  Neurological: Positive for numbness. Negative for extremity weakness.  Hematological: Negative for adenopathy.  Psychiatric/Behavioral: Negative for confusion.    MEDICAL HISTORY:  Past Medical History:  Diagnosis Date  . Arthritis   . Cancer ([place])    rectal  . GERD (gastroesophageal reflux disease)   . Hypercholesteremia   . Hypertension   . Hypertension   . Lower extremity edema   . Urinary incontinence     SURGICAL HISTORY: Past Surgical History:  Procedure Laterality Date  . CHOLECYSTECTOMY  1971  . COLONOSCOPY WITH PROPOFOL N/A 12/03/2018   Procedure: COLONOSCOPY  WITH PROPOFOL;  Surgeon: Lucilla Lame, MD;  Location: [facility] ENDOSCOPY;  Service: Endoscopy;  Laterality: N/A;  . FLEXIBLE SIGMOIDOSCOPY N/A 12/06/2018   Procedure: FLEXIBLE SIGMOIDOSCOPY;  Surgeon: [name], MD;  Location[facility] ENDOSCOPY;  Service: Endoscopy;  Laterality: N/A;  . LAPAROSCOPIC COLOSTOMY  01/06/2019  . PORTACATH PLACEMENT N/A 04/03/2019   Procedure: INSERTION PORT-A-CATH;  Surgeon: Jules Husbands, MD;  Location: [facility] ORS;  Service: General;  Laterality: N/A;    SOCIAL HISTORY: Social History   Socioeconomic History  . Marital status: Married    Spouse name: Not on file  . Number of children: Not on file  . Years of education: Not on file  . Highest education level: Not on file  Occupational History  . Not on file  Tobacco Use  . Smoking status: Former Smoker    Quit date: 12/02/1977    Years since quitting: 41.8  . Smokeless tobacco: Never Used  Substance and Sexual Activity  . Alcohol use: Never  . Drug use: Never  . Sexual activity: Not Currently    Birth control/protection: None  Other Topics Concern  . Not on file  Social History Narrative  . Not on file   Social Determinants of Health   Financial Resource Strain:   . Difficulty of Paying Living Expenses: Not on file  Food Insecurity:   . Worried About Charity fundraiser in the Last Year: Not on file  . Ran Out of Food in the Last Year: Not on file  Transportation Needs:   . Lack of Transportation (Medical): Not on file  . Lack of Transportation (Non-Medical): Not on file  Physical Activity:   .  Days of Exercise per Week: Not on file  . Minutes of Exercise per Session: Not on file  Stress:   . Feeling of Stress : Not on file  Social Connections:   . Frequency of Communication with Friends and Family: Not on file  . Frequency of Social Gatherings with Friends and Family: Not on file  . Attends Religious Services: Not on file  . Active Member of Clubs or Organizations: Not on file  . Attends English as a second language teacher Meetings: Not on file  . Marital Status: Not on file  Intimate Partner Violence:   . Fear of Current or Ex-Partner: Not on file  . Emotionally Abused: Not on file  . Physically Abused: Not on file  . Sexually Abused: Not on file    FAMILY HISTORY: Family History  Problem Relation Age of Onset  . Colon cancer Brother   . Prostate cancer Brother   . Hypertension Mother   . Stroke Mother   . Kidney failure Father   . Breast cancer Neg Hx   . Ovarian cancer Neg Hx     ALLERGIES:  is allergic to sulfamethoxazole-trimethoprim.  MEDICATIONS:  Current Outpatient Medications  Medication Sig Dispense Refill  . celecoxib (CELEBREX) 100 MG capsule Take by mouth.    . chlorhexidine (PERIDEX) 0.12 % solution Use as directed 15 mLs in the mouth or throat 2 (two) times daily. 473 mL 3  . Cholecalciferol (VITAMIN D3) 2000 units capsule Take 2,000 Units by mouth daily.    Verneita Griffes Bark POWD Take by mouth.    . diclofenac sodium (VOLTAREN) 1 % GEL Apply 2 g topically 4 (four) times daily as needed (joint pain).   11  . docusate sodium (COLACE) 100 MG capsule Take 100 mg by mouth 2 (two) times daily.    . fexofenadine (ALLEGRA) 180 MG tablet Take 180 mg by mouth daily.    Marland Kitchen FIBER ADULT GUMMIES PO Take 1 Dose by mouth daily.    . fluticasone (FLONASE) 50 MCG/ACT nasal spray Place 1 spray into both nostrils 2 (two) times a day.     . ketorolac (ACULAR) 0.4 % SOLN Place 1 drop into both eyes daily.     Marland Kitchen lidocaine-prilocaine (EMLA) cream Apply 1 application topically as needed. 30 g 6  . loperamide (IMODIUM) 2 MG capsule Take 1 capsule (2 mg total) by mouth See admin instructions. With onset of loose stool, take '4mg'$  followed by '2mg'$  every 2 hours,  Maximum: 16 mg/day 120 capsule 1  . Lutein 40 MG CAPS Take 40 mg by mouth daily.     . Lutein-Bilberry (BILBERRY PLUS LUTEIN) 02-999 MCG-MG CAPS Take 1 capsule by mouth daily.     . Multiple Vitamins-Minerals (ONE-A-DAY WOMENS 50 PLUS PO)  Take 1 tablet by mouth daily.     Marland Kitchen nystatin (MYCOSTATIN/NYSTOP) powder Apply topically 4 (four) times daily. 30 g 0  . ondansetron (ZOFRAN) 8 MG tablet Take 1 tablet (8 mg total) by mouth every 8 (eight) hours as needed for nausea, vomiting or refractory nausea / vomiting. 90 tablet 1  . pantoprazole (PROTONIX) 40 MG tablet Take 1 tablet (40 mg total) by mouth as needed. (Patient taking differently: Take 40 mg by mouth daily as needed (heartburn). ) 30 tablet 0  . potassium chloride SA (K-DUR) 20 MEQ tablet Take 20 mEq by mouth daily.    . prednisoLONE acetate (PRED FORTE) 1 % ophthalmic suspension Place 1 drop into both eyes 2 (two) times a day.  1  . prochlorperazine (COMPAZINE) 10 MG tablet Take 1 tablet (10 mg total) by mouth every 6 (six) hours as needed (Nausea or vomiting). 30 tablet 1  . simvastatin (ZOCOR) 40 MG tablet Take 40 mg by mouth at bedtime.     . triamterene-hydrochlorothiazide (DYAZIDE) 37.5-25 MG capsule Take 1 capsule by mouth daily.     Marland Kitchen gabapentin (NEURONTIN) 100 MG capsule Take 1 capsule (100 mg total) by mouth at bedtime. 90 capsule 0  . HYDROcodone-acetaminophen (NORCO/VICODIN) 5-325 MG tablet Take 1 tablet by mouth every 4 (four) hours as needed for moderate pain. (Patient not taking: Reported on 10/03/2019) 8 tablet 0   No current facility-administered medications for this visit.     PHYSICAL EXAMINATION: ECOG PERFORMANCE STATUS: 1 - Symptomatic but completely ambulatory Vitals:   10/06/19 1502  BP: 112/74  Pulse: (!) 102  Resp: 18  Temp: (!) 97.3 F (36.3 C)   Filed Weights   10/06/19 1502  Weight: 179 lb 4.8 oz (81.3 kg)    Physical Exam Constitutional:      General: She is not in acute distress. HENT:     Head: Normocephalic and atraumatic.  Eyes:     General: No scleral icterus.    Pupils: Pupils are equal, round, and reactive to light.  Cardiovascular:     Rate and Rhythm: Normal rate and regular rhythm.     Heart sounds: Normal heart  sounds.  Pulmonary:     Effort: Pulmonary effort is normal. No respiratory distress.     Breath sounds: No wheezing.  Abdominal:     General: Bowel sounds are normal. There is no distension.     Palpations: Abdomen is soft. There is no mass.     Tenderness: There is no abdominal tenderness.     Comments: + colostomy bag with small amount of yellow color stool.  Musculoskeletal:        General: No deformity. Normal range of motion.     Cervical back: Normal range of motion and neck supple.  Skin:    General: Skin is warm and dry.     Findings: No erythema or rash.  Neurological:     Mental Status: She is alert and oriented to person, place, and time.     Cranial Nerves: No cranial nerve deficit.     Coordination: Coordination normal.  Psychiatric:        Behavior: Behavior normal.        Thought Content: Thought content normal.    CMP Latest Ref Rng & Units 07/16/2019  Glucose 70 - 99 mg/dL 116(H)  BUN 8 - 23 mg/dL 8  Creatinine 0.44 - 1.00 mg/dL 0.87  Sodium 135 - 145 mmol/L 141  Potassium 3.5 - 5.1 mmol/L 3.6  Chloride 98 - 111 mmol/L 107  CO2 22 - 32 mmol/L 27  Calcium 8.9 - 10.3 mg/dL 9.5  Total Protein 6.5 - 8.1 g/dL 6.8  Total Bilirubin 0.3 - 1.2 mg/dL 0.4  Alkaline Phos 38 - 126 U/L 36(L)  AST 15 - 41 U/L 27  ALT 0 - 44 U/L 13   CBC Latest Ref Rng & Units 07/16/2019  WBC 4.0 - 10.5 K/uL 2.7(L)  Hemoglobin 12.0 - 15.0 g/dL 10.8(L)  Hematocrit 36.0 - 46.0 % 33.2(L)  Platelets 150 - 400 K/uL 139(L)    LABORATORY DATA:  I have reviewed the data as listed Lab Results  Component Value Date   WBC 2.7 (L) 07/16/2019   HGB 10.8 (L) 07/16/2019  HCT 33.2 (L) 07/16/2019   MCV 92.7 07/16/2019   PLT 139 (L) 07/16/2019   Recent Labs    07/02/19 0853 07/09/19 1000 07/16/19 0803  NA 138 140 141  K 3.6 3.6 3.6  CL 104 102 107  CO2 '26 27 27  '$ GLUCOSE 97 93 116*  BUN 7* 10 8  CREATININE 0.77 0.93 0.87  CALCIUM 9.5 9.5 9.5  GFRNONAA >60 >60 >60  GFRAA >60 >60 >60   PROT 6.9 7.2 6.8  ALBUMIN 4.0 4.1 3.9  AST '27 27 27  '$ ALT '14 14 13  '$ ALKPHOS 37* 37* 36*  BILITOT 0.4 0.3 0.4   Iron/TIBC/Ferritin/ %Sat No results found for: IRON, TIBC, FERRITIN, IRONPCTSAT   RADIOGRAPHIC STUDIES: I have personally reviewed the radiological images as listed and agreed with the findings in the report. #11/19/2018 CT chest abdomen pelvis chest 1. Thickened endometrium with some irregularity compatible with the provided diagnosis of endometrial malignancy. There is a mildly prominent left inguinal lymph node at 1.4 cm in short axis which may reflect nodal spread. 2. Bilateral cystic renal lesions. We do not have precontrast images in order to be able to assigned Bosniak classifications. No significant degree of de-enhancement to favor solid mass. 3. Aortic Atherosclerosis (ICD10-I70.0). Coronary atherosclerosis. Sigmoid colon diverticulosis, with several areas of mild colon wall thickening probably from contraction. Correlation with the patient's colon cancer screening history is recommended. If screening is not up-to-date, appropriate screening should be considered. 4. Lumbar impingement at L3-4 and L4-5.5. Esophageal reflux versus dysmotility.  No results found.  ASSESSMENT & PLAN:  1. Rectal cancer ([place])   2. Neuropathy   3. Port-A-Cath in place   Cancer Staging Rectal cancer [facility]) Staging form: Colon and Rectum, AJCC 8th Edition - Clinical stage from 01/23/2019: Stage IIIC (cT4b, cN1a, cM0) - Signed by Earlie Server, MD on 01/23/2019 - Pathologic stage from 10/06/2019: Stage IIC (ypT4b, pN0, cM0) - Signed by Earlie Server, MD on 10/06/2019 #Clinical stage IIIc rectal cancer status post TNT followed by surgical resection. Pathology reports was discussed with patient.ypT4b ypN0 M0 Recommend surveillance. Follow-up with surgery at [facility] for wound care and stitch removal. Discussed about surveillance plan with patient and her daughter. Recommend history and physical/tumor marker  monitoring every 3 to 6 months for the first 2 years after surgery and then every 6 months for a total of 5 years. Chest abdomen pelvis every 6 to 12 months for total 5 years. Colonoscopy 1 year after surgery, if no advanced adenoma, repeat in 3 years and then 5 years.  #Neuropathy, chemotherapy related.  Recommend patient to try gabapentin 100 mg nightly.  If she tolerates, she may titrate up to 100 mg 3 times daily for neuropathy.  Side effects and rationale were discussed with patient.  She agrees with the plan.  Prescription sent to pharmacy.   #Family history with half brother having's history of colon cancer prostate cancer.  Personal history of colorectal cancer.  Patient has not decided if she wants genetic testing.  She will update me subconjunctival  #Port-A-Cath in place, recommend patient to flush port every 6 to 8 weeks.  Earlie Server, MD, PhD

## 2019-10-06 NOTE — Progress Notes (Signed)
Since her surgery she is having bilateral feet numbness and tingling with L>R.  Appetite is improving, 20 lb wt loss since last documented wt in 08/2019

## 2019-10-13 ENCOUNTER — Telehealth: Payer: Self-pay | Admitting: *Deleted

## 2019-10-13 DIAGNOSIS — C20 Malignant neoplasm of rectum: Secondary | ICD-10-CM

## 2019-10-13 MED ORDER — MAGIC MOUTHWASH W/LIDOCAINE: 5.0000 mL | Freq: Four times a day (QID) | ORAL | 3 refills | Status: DC | PRN

## 2019-10-13 NOTE — Telephone Encounter (Signed)
Did patient say what is going on or what kind of mouthwash?

## 2019-10-13 NOTE — Telephone Encounter (Signed)
Patient called requesting a prescription for mouthwash be sent to [facility] [address].

## 2019-10-13 NOTE — Telephone Encounter (Signed)
She can get magic mouth wash.

## 2019-10-13 NOTE — Telephone Encounter (Signed)
Rx faxed

## 2019-10-13 NOTE — Telephone Encounter (Signed)
States she has a little soreness in her mouth and it is the same thing she had when she was getting chemotherapy she used it twice a day after breakfast and at bedtime, she does not remember what it was called.

## 2019-10-13 NOTE — Telephone Encounter (Signed)
Can one of you please fax the pre printed form to pharmacy for her. I do not have that capability

## 2019-11-20 ENCOUNTER — Telehealth: Payer: Self-pay

## 2019-11-20 NOTE — Telephone Encounter (Signed)
Nutrition Assessment   Reason for Assessment:   Patient identified on Malnutrition Screening report for weight loss and poor appetite   ASSESSMENT:  74 year old female with invasive colorectal adenocarcinoma. S/p APR/posterior vaginectomy/TAH/BSO/VY at [facility] on 09/17/2019.  Patient followed by Dr Tasia Catchings and planning surveillance.  Spoke with patient via phone to introduce self and service at [facility].  Patient reports that her appetite and taste is improving.  She has been trying to eat good sources of protein to help wound to heal.  For breakfast has egg and cheese toast sometimes bacon, or cereal.  Lunch is usually meat sandwich or snacks like peanut butter and crackers, cheese and crackers or yogurt.  Dinner is meat (chicken) with vegetables. Likes pinto beans, greens, cabbage, potatoes.     Medications: MVI, Vit C, zinc, fiber   Labs: reviewed   Anthropometrics:    Height: 65 inches Weight: 179 lb 4.8 oz on 12/21 196 lb on 9/30 BMI: 29  9% weight loss in the last 2 months, signficant   NUTRITION DIAGNOSIS:  Inadequate oral intake related to surgery as evidenced by 9% weight loss following surgery although appetite improving   INTERVENTION:  Reviewed importance of good nutrition for wound healing and recovery.  Encouraged patient to continue to consume good sources of protein.  Provided examples for patient.  Contact information provided to patient to contact RD if appetite changes or weight continues to decline   Next Visit: no follow-up,patient to contact RD as needed  Jacqueline Yoder, [place], [place] Registered Dietitian [phone] (pager)

## 2019-11-24 ENCOUNTER — Other Ambulatory Visit: Payer: Self-pay

## 2019-11-24 ENCOUNTER — Inpatient Hospital Stay: Payer: Medicare Other | Attending: Oncology

## 2019-11-24 DIAGNOSIS — Z452 Encounter for adjustment and management of vascular access device: Secondary | ICD-10-CM | POA: Diagnosis not present

## 2019-11-24 DIAGNOSIS — C20 Malignant neoplasm of rectum: Secondary | ICD-10-CM | POA: Diagnosis present

## 2019-11-24 DIAGNOSIS — Z95828 Presence of other vascular implants and grafts: Secondary | ICD-10-CM

## 2019-11-24 MED ORDER — SODIUM CHLORIDE 0.9% FLUSH
10.0000 mL | Freq: Once | INTRAVENOUS | Status: AC
  Administered 2019-11-24: 13:00:00 10 mL via INTRAVENOUS
  Filled 2019-11-24: qty 10

## 2019-11-24 MED ORDER — HEPARIN SOD (PORK) LOCK FLUSH 100 UNIT/ML IV SOLN
500.0000 [IU] | Freq: Once | INTRAVENOUS | Status: AC
  Administered 2019-11-24: 500 [IU] via INTRAVENOUS
  Filled 2019-11-24: qty 5

## 2019-11-28 ENCOUNTER — Telehealth: Payer: Self-pay | Admitting: *Deleted

## 2019-11-28 NOTE — Telephone Encounter (Signed)
Call returned to patient and advised per Dr Tasia Catchings OK to get COVID vaccine

## 2019-11-28 NOTE — Telephone Encounter (Signed)
Jacqueline Yoder called asking if Dr Tasia Catchings thinks it would be alright for her to get the COVID vaccine. Please advise

## 2019-11-28 NOTE — Telephone Encounter (Signed)
Yes. No restriction from oncology aspect

## 2019-12-10 ENCOUNTER — Other Ambulatory Visit: Payer: Self-pay

## 2019-12-11 ENCOUNTER — Ambulatory Visit
Admission: RE | Admit: 2019-12-11 | Discharge: 2019-12-11 | Disposition: A | Discharge status: Home / Self Care | Payer: Medicare Other | Source: Ambulatory Visit | Attending: Radiation Oncology | Admitting: Radiation Oncology

## 2019-12-11 ENCOUNTER — Encounter: Payer: Self-pay | Admitting: Radiation Oncology

## 2019-12-11 ENCOUNTER — Other Ambulatory Visit: Payer: Self-pay

## 2019-12-11 DIAGNOSIS — Z923 Personal history of irradiation: Secondary | ICD-10-CM | POA: Insufficient documentation

## 2019-12-11 DIAGNOSIS — C20 Malignant neoplasm of rectum: Secondary | ICD-10-CM | POA: Diagnosis present

## 2019-12-11 DIAGNOSIS — Z9221 Personal history of antineoplastic chemotherapy: Secondary | ICD-10-CM | POA: Insufficient documentation

## 2019-12-11 NOTE — Progress Notes (Signed)
Radiation Oncology Follow up Note  Name: Jacqueline Yoder   Date:   12/11/2019 MRN:  [id number] DOB: 10/08/1946    This 74 y.o. female presents to the clinic today for 18-monthfollow-up status post neoadjuvant chemoradiation for stage IIIc (T4BN1 a M0) adenocarcinoma the rectum.  REFERRING PROVIDER: AKirk Ruths MD  HPI: Patient is a 74year old female now out 8 months having completed neoadjuvant chemoradiation for stage IIIc adenocarcinoma the rectum.  She had a left groin lymph node which was positive for adenocarcinoma consistent with rectal origin.  She had endometrial biopsy Positive for colorectal primary she had MMR expression showing no loss..  She underwent open APR TAH/BSO posterior vaginectomy and fistula takedown with [facility]  Pathology showed residual moderately differentiated adenocarcinoma the rectum with moderate treatment effect.  There was tumor invading through the full-thickness the rectal wall and involving posterior vagina.  11 lymph nodes were negative for malignancy.  Lesion was a pathologic T4BN0 lesion.  She is currently under surveillance and [facility]  COMPLICATIONS OF TREATMENT: none  FOLLOW UP COMPLIANCE: keeps appointments   PHYSICAL EXAM:  BP (P) 118/71 (BP Location: Left Arm, Patient Position: Sitting)   Pulse (P) 81   Temp (!) (P) 97.5 F (36.4 C) (Tympanic)   Resp (P) 18   Wt (P) 176 lb 6.4 oz (80 kg)   BMI (P) 29.35 kg/m  Well-developed female in NAD she has an incision in the rectum I have not examined although it tends to be healing by secondary intention well-developed well-nourished patient in NAD. HEENT reveals PERLA, EOMI, discs not visualized.  Oral cavity is clear. No oral mucosal lesions are identified. Neck is clear without evidence of cervical or supraclavicular adenopathy. Lungs are clear to A&P. Cardiac examination is essentially  unremarkable with regular rate and rhythm without murmur rub or thrill. Abdomen is benign with no organomegaly or masses noted. Motor sensory and DTR levels are equal and symmetric in the upper and lower extremities. Cranial nerves II through XII are grossly intact. Proprioception is intact. No peripheral adenopathy or edema is identified. No motor or sensory levels are noted. Crude visual fields are within normal range.  RADIOLOGY RESULTS: No current films to review  PLAN: Present time patient is recovering well from her surgery.  At this time she is under surveillance.  I have asked to see her back in 6 months for follow-up.  I assume CT scans were performed the near future although if they are not I will order them prior to her next visit.  [facility]  Patient knows to call with any concerns.  I would like to take this opportunity to thank you for allowing me to participate in the care of your patient..Noreene Filbert MD

## 2019-12-24 ENCOUNTER — Encounter: Payer: Self-pay | Admitting: Oncology

## 2020-01-02 ENCOUNTER — Encounter: Payer: Self-pay | Admitting: Oncology

## 2020-01-02 NOTE — Progress Notes (Signed)
Patient is coming in for follow up she is doing well no complaints  

## 2020-01-05 ENCOUNTER — Inpatient Hospital Stay (HOSPITAL_BASED_OUTPATIENT_CLINIC_OR_DEPARTMENT_OTHER): Payer: Medicare Other | Admitting: Oncology

## 2020-01-05 ENCOUNTER — Inpatient Hospital Stay: Payer: Medicare Other | Attending: Oncology

## 2020-01-05 ENCOUNTER — Other Ambulatory Visit: Payer: Self-pay

## 2020-01-05 VITALS — BP 133/78 | HR 71 | Temp 96.4°F | Resp 18 | Wt 173.9 lb

## 2020-01-05 DIAGNOSIS — I700 Atherosclerosis of aorta: Secondary | ICD-10-CM | POA: Diagnosis not present

## 2020-01-05 DIAGNOSIS — Z87891 Personal history of nicotine dependence: Secondary | ICD-10-CM | POA: Diagnosis not present

## 2020-01-05 DIAGNOSIS — K5900 Constipation, unspecified: Secondary | ICD-10-CM | POA: Diagnosis not present

## 2020-01-05 DIAGNOSIS — K219 Gastro-esophageal reflux disease without esophagitis: Secondary | ICD-10-CM | POA: Insufficient documentation

## 2020-01-05 DIAGNOSIS — D649 Anemia, unspecified: Secondary | ICD-10-CM | POA: Insufficient documentation

## 2020-01-05 DIAGNOSIS — E7800 Pure hypercholesterolemia, unspecified: Secondary | ICD-10-CM | POA: Insufficient documentation

## 2020-01-05 DIAGNOSIS — C20 Malignant neoplasm of rectum: Secondary | ICD-10-CM

## 2020-01-05 DIAGNOSIS — R634 Abnormal weight loss: Secondary | ICD-10-CM | POA: Diagnosis not present

## 2020-01-05 DIAGNOSIS — Z95828 Presence of other vascular implants and grafts: Secondary | ICD-10-CM

## 2020-01-05 DIAGNOSIS — R9389 Abnormal findings on diagnostic imaging of other specified body structures: Secondary | ICD-10-CM | POA: Diagnosis not present

## 2020-01-05 DIAGNOSIS — I10 Essential (primary) hypertension: Secondary | ICD-10-CM | POA: Diagnosis not present

## 2020-01-05 DIAGNOSIS — M1990 Unspecified osteoarthritis, unspecified site: Secondary | ICD-10-CM | POA: Diagnosis not present

## 2020-01-05 DIAGNOSIS — Z79899 Other long term (current) drug therapy: Secondary | ICD-10-CM | POA: Insufficient documentation

## 2020-01-05 DIAGNOSIS — G629 Polyneuropathy, unspecified: Secondary | ICD-10-CM | POA: Insufficient documentation

## 2020-01-05 DIAGNOSIS — Z809 Family history of malignant neoplasm, unspecified: Secondary | ICD-10-CM

## 2020-01-05 LAB — CBC WITH DIFFERENTIAL/PLATELET
Abs Immature Granulocytes: 0.03 10*3/uL (ref 0.00–0.07)
Basophils Absolute: 0 10*3/uL (ref 0.0–0.1)
Basophils Relative: 1 %
Eosinophils Absolute: 0.1 10*3/uL (ref 0.0–0.5)
Eosinophils Relative: 2 %
HCT: 35.6 % — ABNORMAL LOW (ref 36.0–46.0)
Hemoglobin: 11.3 g/dL — ABNORMAL LOW (ref 12.0–15.0)
Immature Granulocytes: 1 %
Lymphocytes Relative: 35 %
Lymphs Abs: 1.3 10*3/uL (ref 0.7–4.0)
MCH: 28.5 pg (ref 26.0–34.0)
MCHC: 31.7 g/dL (ref 30.0–36.0)
MCV: 89.9 fL (ref 80.0–100.0)
Monocytes Absolute: 0.4 10*3/uL (ref 0.1–1.0)
Monocytes Relative: 12 %
Neutro Abs: 1.9 10*3/uL (ref 1.7–7.7)
Neutrophils Relative %: 49 %
Platelets: 244 10*3/uL (ref 150–400)
RBC: 3.96 MIL/uL (ref 3.87–5.11)
RDW: 15.9 % — ABNORMAL HIGH (ref 11.5–15.5)
WBC: 3.8 10*3/uL — ABNORMAL LOW (ref 4.0–10.5)
nRBC: 0 % (ref 0.0–0.2)

## 2020-01-05 LAB — COMPREHENSIVE METABOLIC PANEL
ALT: 14 U/L (ref 0–44)
AST: 23 U/L (ref 15–41)
Albumin: 4.3 g/dL (ref 3.5–5.0)
Alkaline Phosphatase: 39 U/L (ref 38–126)
Anion gap: 10 (ref 5–15)
BUN: 15 mg/dL (ref 8–23)
CO2: 26 mmol/L (ref 22–32)
Calcium: 9.6 mg/dL (ref 8.9–10.3)
Chloride: 103 mmol/L (ref 98–111)
Creatinine, Ser: 0.91 mg/dL (ref 0.44–1.00)
GFR calc Af Amer: >60 mL/min (ref >60)
GFR calc non Af Amer: >60 mL/min (ref >60)
Glucose, Bld: 96 mg/dL (ref 70–99)
Potassium: 3.6 mmol/L (ref 3.5–5.1)
Sodium: 139 mmol/L (ref 135–145)
Total Bilirubin: 0.5 mg/dL (ref 0.3–1.2)
Total Protein: 7.4 g/dL (ref 6.5–8.1)

## 2020-01-05 MED ORDER — HEPARIN SOD (PORK) LOCK FLUSH 100 UNIT/ML IV SOLN
500.0000 [IU] | Freq: Once | INTRAVENOUS | Status: AC
  Administered 2020-01-05: 500 [IU] via INTRAVENOUS
  Filled 2020-01-05: qty 5

## 2020-01-05 MED ORDER — SODIUM CHLORIDE 0.9% FLUSH
10.0000 mL | Freq: Once | INTRAVENOUS | Status: AC
  Administered 2020-01-05: 10 mL via INTRAVENOUS
  Filled 2020-01-05: qty 10

## 2020-01-05 NOTE — Progress Notes (Signed)
Hematology/Oncology progress note [facility] Telephone:([phone] [phone]   Patient Care Team: Kirk Ruths, MD as PCP - General (Internal Medicine) Clent Jacks, RN as Registered Nurse  REFERRING PROVIDER: Dr.Secord CHIEF COMPLAINTS/REASON FOR VISIT:  Follow up for rectal cancer  HISTORY OF PRESENTING ILLNESS:  Jacqueline Yoder is a  74 y.o.  female with PMH listed below who was referred to me for evaluation of endometrial cancer.   Patient initially presented with complaints of postmenopausal bleeding on 08/16/2018.  History of was menopausal vaginal bleeding in 2016 which resulted in cervical polypectomy.  Pathology 02/04/2015 showed cervical polyp, consistent with benign endometrial polyp.  [name] after polypectomy due to anxiety associated with pelvic exams.  pelvic exam on 08/16/2018 reviewed cervical abnormality and from enlarged uterus. Seen by Dr. Marcelline Mates on 10/29/2018.  Endometrial biopsy and a Pap smear was performed. 10/29/2018 Pap smear showed adenocarcinoma, favor endometrial origin. 10/29/2018 endometrial biopsy showed endometrioid carcinoma, FIGO grade 1.  10/29/2018- TA & TV Ultrasound revealed: Anteverted uterus measuring 8.7 x 5.6 x 6.4 cm without evidence of focal masses.  The endometrium measuring 24.1 mm (thickened) and heterogeneous.  Right and left ovaries not visualized.  No adnexal masses identified.  No free fluid in cul-de-sac.  Patient was seen by Dr. Theora Gianotti in clinic on 11/13/2018.  Cervical exam reveals 2 cm exophytic irregular mass consistent with malignancy.   11/19/2018 CT chest abdomen pelvis with contrast showed thickened endometrium with some irregularity compatible with the provided diagnosis of endometrial malignancy.  There is a mildly prominent left inguinal node 1.4 cm.  Patient was seen by Dr. Fransisca Connors on 11/20/2018 and left groin lymph node biopsy was recommended.  11/26/2018 patient underwent  left inguinal lymph node biopsy. Pathology showed metastatic adenocarcinoma consistent with colorectal origin.  CDX 2+.  Case was discussed on tumor board.  Recommend colonoscopy for further evaluation.  Patient reports significant weight loss 30 pounds over the last year.  Chronic vaginal spotting. Change of bowel habits the past few months.  More constipated.  Family history positive for brother who has colon cancer prostate cancer.  patient has underwent colonoscopy on 12/03/2018 which reviewed a nonobstructing large mass in the rectum.  Also chronic fistula.  Mass was not circumferential.  This was biopsied with a cold forceps for histology.  Pathology came back hyperplastic polyp negative for dysplasia and malignancy. [name], patient underwent flex sigmoidoscopy on 12/06/2018 with rebiopsy of the rectal mass. This time biopsy results came back positive for invasive colorectal adenocarcinoma, moderately differentiated. Immunotherapy for nearly mismatch repair protein (MMR ) was performed.  There is no loss of MMR expression.  low probability of MSI high.   # Seen by [name] for evaluation of resectability for rectal cancer. In addition, she also had a second opinion with [facility] pathology where her endometrial biopsy pathology was changed to  adenocarcinoma, consistent with colorectal primary.   [name]. She has home health that has been assisting with ostomy care  Patient was also evaluated by [facility] oncology.  Recommendation is to proceed with TNT with concurrent chemoradiation followed by neoadjuvant chemotherapy followed by surgical resection. Patient prefers to have treatment done locally with [facility].   # Oncology Treatment:  02/03/2019- 03/19/2019  concurrent Xeloda and radiation.  Xeloda dose 884m /m2 BID - rounded to 16557mBID- on days of radiation. 04/09/2019, started on FOLFOX with bolus 5-FU omitted.  07/16/2019  finished  8 cycles of FOLFOX.  # Patient has underwent 09/17/19 APR/posterior vaginectomy/TAH/BSO/VY-flap pT4b pN0 with close vaginal margin 0.2 mm.  Uterus and ovaries negative for malignancy. Patient reports bilateral lower extremity numbness and tingling, intermittent, left worse than right. She has lost a lot of weight since her APR surgery.   Surveillance plan Recommend history and physical/tumor marker monitoring every 3 to 6 months for the first 2 years after surgery and then every 6 months for a total of 5 years. Chest abdomen pelvis every 6 to 12 months for total 5 years. Colonoscopy 1 year after surgery, if no advanced adenoma, repeat in 3 years and then 5 years.   INTERVAL HISTORY Jacqueline Yoder is a 74 y.o. female who has above history reviewed by me today presents for follow up.  Appetite is good.  Denies any pain. She has colostomy, no concerns.  She has lost weight since her surgery.  She was seen by Surgery in Jan 2021 She has stopped taking garbaentin. She feels numbness on her feet has improved.  Review of Systems  Constitutional: Positive for fatigue and unexpected weight change. Negative for appetite change, chills and fever.  HENT:   Negative for hearing loss and voice change.   Eyes: Negative for eye problems.  Respiratory: Negative for chest tightness and cough.   Cardiovascular: Negative for chest pain.  Gastrointestinal: Negative for abdominal distention, abdominal pain, blood in stool, constipation, diarrhea and nausea.       Colostomy bag  Endocrine: Negative for hot flashes.  Genitourinary: Negative for difficulty urinating and frequency.   Musculoskeletal: Negative for arthralgias.  Skin: Negative for itching and rash.  Neurological: Positive for numbness. Negative for extremity weakness.  Hematological: Negative for adenopathy.  Psychiatric/Behavioral: Negative for confusion.    MEDICAL HISTORY:  Past Medical History:  Diagnosis Date  . Arthritis     . Cancer ([place])    rectal  . GERD (gastroesophageal reflux disease)   . Hypercholesteremia   . Hypertension   . Hypertension   . Lower extremity edema   . Urinary incontinence     SURGICAL HISTORY: Past Surgical History:  Procedure Laterality Date  . CHOLECYSTECTOMY  1971  . COLONOSCOPY WITH PROPOFOL N/A 12/03/2018   Procedure: COLONOSCOPY WITH PROPOFOL;  Surgeon: Lucilla Lame, MD;  Location: [facility] ENDOSCOPY;  Service: Endoscopy;  Laterality: N/A;  . FLEXIBLE SIGMOIDOSCOPY N/A 12/06/2018   Procedure: FLEXIBLE SIGMOIDOSCOPY;  Surgeon: [name], MD;  Location: [facility] ENDOSCOPY;  Service: Endoscopy;  Laterality: N/A;  . LAPAROSCOPIC COLOSTOMY  01/06/2019  . PORTACATH PLACEMENT N/A 04/03/2019   Procedure: INSERTION PORT-A-CATH;  Surgeon: Jules Husbands, MD;  Location: [facility] ORS;  Service: General;  Laterality: N/A;    SOCIAL HISTORY: Social History   Socioeconomic History  . Marital status: Married    Spouse name: Not on file  . Number of children: Not on file  . Years of education: Not on file  . Highest education level: Not on file  Occupational History  . Not on file  Tobacco Use  . Smoking status: Former Smoker    Quit date: 12/02/1977    Years since quitting: 42.1  . Smokeless tobacco: Never Used  Substance and Sexual Activity  . Alcohol use: Never  . Drug use: Never  . Sexual activity: Not Currently    Birth control/protection: None  Other Topics Concern  . Not on file  Social History Narrative  . Not on file   Social Determinants of Health   Financial  Resource Strain:   . Difficulty of Paying Living Expenses:   Food Insecurity:   . Worried About Charity fundraiser in the Last Year:   . Arboriculturist in the Last Year:   Transportation Needs:   . Film/video editor (Medical):   Marland Kitchen Lack of Transportation (Non-Medical):   Physical Activity:   . Days of Exercise per Week:   . Minutes of Exercise per Session:   Stress:   . Feeling of Stress :   Social  Connections:   . Frequency of Communication with Friends and Family:   . Frequency of Social Gatherings with Friends and Family:   . Attends Religious Services:   . Active Member of Clubs or Organizations:   . Attends Archivist Meetings:   Marland Kitchen Marital Status:   Intimate Partner Violence:   . Fear of Current or Ex-Partner:   . Emotionally Abused:   Marland Kitchen Physically Abused:   . Sexually Abused:     FAMILY HISTORY: Family History  Problem Relation Age of Onset  . Colon cancer Brother   . Prostate cancer Brother   . Hypertension Mother   . Stroke Mother   . Kidney failure Father   . Breast cancer Neg Hx   . Ovarian cancer Neg Hx     ALLERGIES:  is allergic to sulfamethoxazole-trimethoprim.  MEDICATIONS:  Current Outpatient Medications  Medication Sig Dispense Refill  . amoxicillin (AMOXIL) 875 MG tablet Take by mouth.    . chlorhexidine (PERIDEX) 0.12 % solution Use as directed 15 mLs in the mouth or throat 2 (two) times daily. 473 mL 3  . Cholecalciferol (VITAMIN D3) 2000 units capsule Take 2,000 Units by mouth daily.    Verneita Griffes Bark POWD Take by mouth.    . diclofenac sodium (VOLTAREN) 1 % GEL Apply 2 g topically 4 (four) times daily as needed (joint pain).   11  . docusate sodium (COLACE) 100 MG capsule Take 100 mg by mouth 2 (two) times daily.    . fexofenadine (ALLEGRA) 180 MG tablet Take 180 mg by mouth daily.    Marland Kitchen FIBER ADULT GUMMIES PO Take 1 Dose by mouth daily.    . fluticasone (FLONASE) 50 MCG/ACT nasal spray Place 1 spray into both nostrils 2 (two) times a day.     . gabapentin (NEURONTIN) 100 MG capsule Take 1 capsule (100 mg total) by mouth at bedtime. 90 capsule 0  . ipratropium (ATROVENT) 0.03 % nasal spray     . ketorolac (ACULAR) 0.4 % SOLN Place 1 drop into both eyes daily.     Marland Kitchen lidocaine-prilocaine (EMLA) cream Apply 1 application topically as needed. 30 g 6  . loperamide (IMODIUM) 2 MG capsule Take 1 capsule (2 mg total) by mouth See admin  instructions. With onset of loose stool, take 59m followed by 229mevery 2 hours,  Maximum: 16 mg/day 120 capsule 1  . Lutein 40 MG CAPS Take 40 mg by mouth daily.     . Lutein-Bilberry (BILBERRY PLUS LUTEIN) 02-999 MCG-MG CAPS Take 1 capsule by mouth daily.     . magic mouthwash w/lidocaine SOLN Take 5 mLs by mouth 4 (four) times daily as needed for mouth pain. Sig: Swish/Swallow 5-10 ml four times a day as needed 480 mL 3  . Multiple Vitamins-Minerals (ONE-A-DAY WOMENS 50 PLUS PO) Take 1 tablet by mouth daily.     . Marland Kitchenystatin (MYCOSTATIN/NYSTOP) powder Apply topically 4 (four) times daily. 30 g 0  .  ondansetron (ZOFRAN) 8 MG tablet Take 1 tablet (8 mg total) by mouth every 8 (eight) hours as needed for nausea, vomiting or refractory nausea / vomiting. 90 tablet 1  . pantoprazole (PROTONIX) 40 MG tablet Take 1 tablet (40 mg total) by mouth as needed. (Patient taking differently: Take 40 mg by mouth daily as needed (heartburn). ) 30 tablet 0  . potassium chloride SA (K-DUR) 20 MEQ tablet Take 20 mEq by mouth daily.    . prednisoLONE acetate (PRED FORTE) 1 % ophthalmic suspension Place 1 drop into both eyes 2 (two) times a day.   1  . predniSONE (DELTASONE) 20 MG tablet Take by mouth.    . prochlorperazine (COMPAZINE) 10 MG tablet Take 1 tablet (10 mg total) by mouth every 6 (six) hours as needed (Nausea or vomiting). 30 tablet 1  . simvastatin (ZOCOR) 40 MG tablet Take 40 mg by mouth at bedtime.     . triamterene-hydrochlorothiazide (DYAZIDE) 37.5-25 MG capsule Take 1 capsule by mouth daily.     Marland Kitchen HYDROcodone-acetaminophen (NORCO/VICODIN) 5-325 MG tablet Take 1 tablet by mouth every 4 (four) hours as needed for moderate pain. (Patient not taking: Reported on 10/03/2019) 8 tablet 0   No current facility-administered medications for this visit.     PHYSICAL EXAMINATION: ECOG PERFORMANCE STATUS: 1 - Symptomatic but completely ambulatory Vitals:   01/02/20 0957  BP: 133/78  Pulse: 71  Resp: 18    Temp: (!) 96.4 F (35.8 C)   Filed Weights   01/02/20 0957  Weight: 173 lb 14.4 oz (78.9 kg)    Physical Exam Constitutional:      General: She is not in acute distress. HENT:     Head: Normocephalic and atraumatic.  Eyes:     General: No scleral icterus. Cardiovascular:     Rate and Rhythm: Normal rate and regular rhythm.     Heart sounds: Normal heart sounds.  Pulmonary:     Effort: Pulmonary effort is normal. No respiratory distress.     Breath sounds: No wheezing.  Abdominal:     General: Bowel sounds are normal. There is no distension.     Palpations: Abdomen is soft.  Musculoskeletal:        General: No deformity. Normal range of motion.     Cervical back: Normal range of motion and neck supple.  Skin:    General: Skin is warm and dry.     Findings: No erythema or rash.  Neurological:     Mental Status: She is alert and oriented to person, place, and time. Mental status is at baseline.     Cranial Nerves: No cranial nerve deficit.     Coordination: Coordination normal.  Psychiatric:        Mood and Affect: Mood normal.    CMP Latest Ref Rng & Units 01/05/2020  Glucose 70 - 99 mg/dL 96  BUN 8 - 23 mg/dL 15  Creatinine 0.44 - 1.00 mg/dL 0.91  Sodium 135 - 145 mmol/L 139  Potassium 3.5 - 5.1 mmol/L 3.6  Chloride 98 - 111 mmol/L 103  CO2 22 - 32 mmol/L 26  Calcium 8.9 - 10.3 mg/dL 9.6  Total Protein 6.5 - 8.1 g/dL 7.4  Total Bilirubin 0.3 - 1.2 mg/dL 0.5  Alkaline Phos 38 - 126 U/L 39  AST 15 - 41 U/L 23  ALT 0 - 44 U/L 14   CBC Latest Ref Rng & Units 01/05/2020  WBC 4.0 - 10.5 K/uL 3.8(L)  Hemoglobin 12.0 -  15.0 g/dL 11.3(L)  Hematocrit 36.0 - 46.0 % 35.6(L)  Platelets 150 - 400 K/uL 244    LABORATORY DATA:  I have reviewed the data as listed Lab Results  Component Value Date   WBC 3.8 (L) 01/05/2020   HGB 11.3 (L) 01/05/2020   HCT 35.6 (L) 01/05/2020   MCV 89.9 01/05/2020   PLT 244 01/05/2020   Recent Labs    07/09/19 1000 07/16/19 0803  01/05/20 0928  NA 140 141 139  K 3.6 3.6 3.6  CL 102 107 103  CO2 _0 GLUCOSE 93 116* 96  BUN _1 CREATININE 0.93 0.87 0.91  CALCIUM 9.5 9.5 9.6  GFRNONAA >60 >60 >60  GFRAA >60 >60 >60  PROT 7.2 6.8 7.4  ALBUMIN 4.1 3.9 4.3  AST _2 ALT _3 ALKPHOS 37* 36* 39  BILITOT 0.3 0.4 0.5   Iron/TIBC/Ferritin/ %Sat No results found for: IRON, TIBC, FERRITIN, IRONPCTSAT   RADIOGRAPHIC STUDIES: I have personally reviewed the radiological images as listed and agreed with the findings in the report. #11/19/2018 CT chest abdomen pelvis chest 1. Thickened endometrium with some irregularity compatible with the provided diagnosis of endometrial malignancy. There is a mildly prominent left inguinal lymph node at 1.4 cm in short axis which may reflect nodal spread. 2. Bilateral cystic renal lesions. We do not have precontrast images in order to be able to assigned Bosniak classifications. No significant degree of de-enhancement to favor solid mass. 3. Aortic Atherosclerosis (ICD10-I70.0). Coronary atherosclerosis. Sigmoid colon diverticulosis, with several areas of mild colon wall thickening probably from contraction. Correlation with the patient's colon cancer screening history is recommended. If screening is not up-to-date, appropriate screening should be considered. 4. Lumbar impingement at L3-4 and L4-5.5. Esophageal reflux versus dysmotility.  No results found.  ASSESSMENT & PLAN:  1. Rectal cancer ([place])   2. Port-A-Cath in place   3. Neuropathy   4. Family history of cancer   5. Normocytic anemia   Cancer Staging Rectal cancer [facility]) Staging form: Colon and Rectum, AJCC 8th Edition - Clinical stage from 01/23/2019: Stage IIIC (cT4b, cN1a, cM0) - Signed by Earlie Server, MD on 01/23/2019 - Pathologic stage from 10/06/2019: Stage IIC (ypT4b, pN0, cM0) - Signed by Earlie Server, MD on 10/06/2019 #Clinical stage IIIc rectal cancer status post TNT followed by surgical  resection. Obtain CT chest abdomen pelvis for surveillance.  Labs are reviewed and discussed with patient.  # Anemia, normocytic, hemoglobin has improved since last visit.  # Neuropathy, improved. She wants to be off gabapentin. Advise patient to resume if symptoms get worse.   #Family history with half brother having's history of colon cancer prostate cancer.  Personal history of colorectal cancer.  Patient has not decided if she wants genetic testing.    #Port-A-Cath in place, recommend patient to flush port every 6 to 8 weeks.  Earlie Server, MD, PhD

## 2020-01-06 ENCOUNTER — Telehealth: Payer: Self-pay

## 2020-01-06 LAB — CEA: CEA: 80.9 ng/mL — ABNORMAL HIGH (ref 0.0–4.7)

## 2020-01-06 NOTE — Telephone Encounter (Signed)
-----   Message from Earlie Server, MD sent at 01/05/2020  9:11 PM EDT ----- Please arrange her to have port flush every 6-8 weeks x 6.

## 2020-01-06 NOTE — Telephone Encounter (Signed)
Port Flush appts has been scheduled as requested. I will contact pt to make her aware. Also a NEW appt reminder letter will be mailed out

## 2020-01-13 ENCOUNTER — Observation Stay
Admission: EM | Admit: 2020-01-13 | Discharge: 2020-01-14 | Disposition: A | Discharge status: Home / Self Care | Payer: Medicare Other | Attending: Internal Medicine | Admitting: Internal Medicine

## 2020-01-13 ENCOUNTER — Other Ambulatory Visit: Payer: Self-pay

## 2020-01-13 ENCOUNTER — Observation Stay: Payer: Medicare Other

## 2020-01-13 ENCOUNTER — Observation Stay (HOSPITAL_BASED_OUTPATIENT_CLINIC_OR_DEPARTMENT_OTHER)
Admit: 2020-01-13 | Discharge: 2020-01-13 | Disposition: A | Discharge status: Home / Self Care | Payer: Medicare Other | Attending: Internal Medicine | Admitting: Internal Medicine

## 2020-01-13 ENCOUNTER — Encounter: Payer: Self-pay | Admitting: Medical Oncology

## 2020-01-13 ENCOUNTER — Ambulatory Visit
Admission: RE | Admit: 2020-01-13 | Discharge: 2020-01-13 | Disposition: A | Discharge status: Home / Self Care | Payer: Medicare Other | Source: Ambulatory Visit | Attending: Oncology | Admitting: Oncology

## 2020-01-13 DIAGNOSIS — I82409 Acute embolism and thrombosis of unspecified deep veins of unspecified lower extremity: Secondary | ICD-10-CM | POA: Diagnosis present

## 2020-01-13 DIAGNOSIS — Z9049 Acquired absence of other specified parts of digestive tract: Secondary | ICD-10-CM | POA: Diagnosis not present

## 2020-01-13 DIAGNOSIS — M1990 Unspecified osteoarthritis, unspecified site: Secondary | ICD-10-CM | POA: Diagnosis not present

## 2020-01-13 DIAGNOSIS — I700 Atherosclerosis of aorta: Secondary | ICD-10-CM | POA: Diagnosis not present

## 2020-01-13 DIAGNOSIS — I82403 Acute embolism and thrombosis of unspecified deep veins of lower extremity, bilateral: Secondary | ICD-10-CM | POA: Insufficient documentation

## 2020-01-13 DIAGNOSIS — Z7901 Long term (current) use of anticoagulants: Secondary | ICD-10-CM | POA: Insufficient documentation

## 2020-01-13 DIAGNOSIS — Z933 Colostomy status: Secondary | ICD-10-CM | POA: Diagnosis not present

## 2020-01-13 DIAGNOSIS — Z79899 Other long term (current) drug therapy: Secondary | ICD-10-CM | POA: Diagnosis not present

## 2020-01-13 DIAGNOSIS — I10 Essential (primary) hypertension: Secondary | ICD-10-CM | POA: Diagnosis not present

## 2020-01-13 DIAGNOSIS — E7800 Pure hypercholesterolemia, unspecified: Secondary | ICD-10-CM | POA: Diagnosis not present

## 2020-01-13 DIAGNOSIS — E669 Obesity, unspecified: Secondary | ICD-10-CM | POA: Diagnosis not present

## 2020-01-13 DIAGNOSIS — Z8616 Personal history of COVID-19: Secondary | ICD-10-CM | POA: Insufficient documentation

## 2020-01-13 DIAGNOSIS — N281 Cyst of kidney, acquired: Secondary | ICD-10-CM | POA: Insufficient documentation

## 2020-01-13 DIAGNOSIS — Z888 Allergy status to other drugs, medicaments and biological substances status: Secondary | ICD-10-CM | POA: Insufficient documentation

## 2020-01-13 DIAGNOSIS — Z823 Family history of stroke: Secondary | ICD-10-CM | POA: Insufficient documentation

## 2020-01-13 DIAGNOSIS — Z841 Family history of disorders of kidney and ureter: Secondary | ICD-10-CM | POA: Insufficient documentation

## 2020-01-13 DIAGNOSIS — Z20822 Contact with and (suspected) exposure to covid-19: Secondary | ICD-10-CM | POA: Insufficient documentation

## 2020-01-13 DIAGNOSIS — R32 Unspecified urinary incontinence: Secondary | ICD-10-CM | POA: Diagnosis not present

## 2020-01-13 DIAGNOSIS — E785 Hyperlipidemia, unspecified: Secondary | ICD-10-CM | POA: Diagnosis not present

## 2020-01-13 DIAGNOSIS — I82509 Chronic embolism and thrombosis of unspecified deep veins of unspecified lower extremity: Secondary | ICD-10-CM | POA: Diagnosis present

## 2020-01-13 DIAGNOSIS — I2699 Other pulmonary embolism without acute cor pulmonale: Principal | ICD-10-CM | POA: Diagnosis present

## 2020-01-13 DIAGNOSIS — Z791 Long term (current) use of non-steroidal anti-inflammatories (NSAID): Secondary | ICD-10-CM | POA: Insufficient documentation

## 2020-01-13 DIAGNOSIS — Z85048 Personal history of other malignant neoplasm of rectum, rectosigmoid junction, and anus: Secondary | ICD-10-CM | POA: Insufficient documentation

## 2020-01-13 DIAGNOSIS — Z881 Allergy status to other antibiotic agents status: Secondary | ICD-10-CM | POA: Insufficient documentation

## 2020-01-13 DIAGNOSIS — Z882 Allergy status to sulfonamides status: Secondary | ICD-10-CM | POA: Insufficient documentation

## 2020-01-13 DIAGNOSIS — Z6828 Body mass index (BMI) 28.0-28.9, adult: Secondary | ICD-10-CM | POA: Insufficient documentation

## 2020-01-13 DIAGNOSIS — C20 Malignant neoplasm of rectum: Secondary | ICD-10-CM | POA: Diagnosis present

## 2020-01-13 DIAGNOSIS — Z87891 Personal history of nicotine dependence: Secondary | ICD-10-CM | POA: Insufficient documentation

## 2020-01-13 DIAGNOSIS — I129 Hypertensive chronic kidney disease with stage 1 through stage 4 chronic kidney disease, or unspecified chronic kidney disease: Secondary | ICD-10-CM | POA: Insufficient documentation

## 2020-01-13 DIAGNOSIS — N183 Chronic kidney disease, stage 3 unspecified: Secondary | ICD-10-CM | POA: Insufficient documentation

## 2020-01-13 DIAGNOSIS — Z8042 Family history of malignant neoplasm of prostate: Secondary | ICD-10-CM | POA: Insufficient documentation

## 2020-01-13 DIAGNOSIS — K219 Gastro-esophageal reflux disease without esophagitis: Secondary | ICD-10-CM | POA: Diagnosis present

## 2020-01-13 DIAGNOSIS — Z800 Family history of malignant neoplasm of digestive organs: Secondary | ICD-10-CM | POA: Insufficient documentation

## 2020-01-13 DIAGNOSIS — Z9221 Personal history of antineoplastic chemotherapy: Secondary | ICD-10-CM | POA: Insufficient documentation

## 2020-01-13 DIAGNOSIS — Z8249 Family history of ischemic heart disease and other diseases of the circulatory system: Secondary | ICD-10-CM | POA: Insufficient documentation

## 2020-01-13 LAB — CBC WITH DIFFERENTIAL/PLATELET
Abs Immature Granulocytes: 0.02 10*3/uL (ref 0.00–0.07)
Basophils Absolute: 0 10*3/uL (ref 0.0–0.1)
Basophils Relative: 0 %
Eosinophils Absolute: 0.1 10*3/uL (ref 0.0–0.5)
Eosinophils Relative: 1 %
HCT: 37.8 % (ref 36.0–46.0)
Hemoglobin: 11.8 g/dL — ABNORMAL LOW (ref 12.0–15.0)
Immature Granulocytes: 1 %
Lymphocytes Relative: 26 %
Lymphs Abs: 1 10*3/uL (ref 0.7–4.0)
MCH: 29 pg (ref 26.0–34.0)
MCHC: 31.2 g/dL (ref 30.0–36.0)
MCV: 92.9 fL (ref 80.0–100.0)
Monocytes Absolute: 0.3 10*3/uL (ref 0.1–1.0)
Monocytes Relative: 9 %
Neutro Abs: 2.3 10*3/uL (ref 1.7–7.7)
Neutrophils Relative %: 63 %
Platelets: 167 10*3/uL (ref 150–400)
RBC: 4.07 MIL/uL (ref 3.87–5.11)
RDW: 15.9 % — ABNORMAL HIGH (ref 11.5–15.5)
WBC: 3.7 10*3/uL — ABNORMAL LOW (ref 4.0–10.5)
nRBC: 0 % (ref 0.0–0.2)

## 2020-01-13 LAB — COMPREHENSIVE METABOLIC PANEL
ALT: 13 U/L (ref 0–44)
AST: 28 U/L (ref 15–41)
Albumin: 4.3 g/dL (ref 3.5–5.0)
Alkaline Phosphatase: 41 U/L (ref 38–126)
Anion gap: 11 (ref 5–15)
BUN: 11 mg/dL (ref 8–23)
CO2: 28 mmol/L (ref 22–32)
Calcium: 9.7 mg/dL (ref 8.9–10.3)
Chloride: 100 mmol/L (ref 98–111)
Creatinine, Ser: 0.86 mg/dL (ref 0.44–1.00)
GFR calc Af Amer: >60 mL/min (ref >60)
GFR calc non Af Amer: >60 mL/min (ref >60)
Glucose, Bld: 86 mg/dL (ref 70–99)
Potassium: 3.6 mmol/L (ref 3.5–5.1)
Sodium: 139 mmol/L (ref 135–145)
Total Bilirubin: 0.6 mg/dL (ref 0.3–1.2)
Total Protein: 7.4 g/dL (ref 6.5–8.1)

## 2020-01-13 LAB — APTT: aPTT: 78 seconds — ABNORMAL HIGH (ref 24–36)

## 2020-01-13 LAB — BRAIN NATRIURETIC PEPTIDE: B Natriuretic Peptide: 70 pg/mL (ref 0.0–100.0)

## 2020-01-13 LAB — TYPE AND SCREEN
ABO/RH(D): O POS
Antibody Screen: NEGATIVE

## 2020-01-13 LAB — RESPIRATORY PANEL BY RT PCR (FLU A&B, COVID)
Influenza A by PCR: NEGATIVE
Influenza B by PCR: NEGATIVE
SARS Coronavirus 2 by RT PCR: NEGATIVE

## 2020-01-13 LAB — HEPARIN LEVEL (UNFRACTIONATED)
Heparin Unfractionated: 1.48 IU/mL — ABNORMAL HIGH (ref 0.30–0.70)
Heparin Unfractionated: 1.37 IU/mL — ABNORMAL HIGH (ref 0.30–0.70)

## 2020-01-13 LAB — TROPONIN I (HIGH SENSITIVITY)
Troponin I (High Sensitivity): 5 ng/L (ref <18)
Troponin I (High Sensitivity): 6 ng/L (ref <18)

## 2020-01-13 LAB — PROTIME-INR
INR: 1 (ref 0.8–1.2)
Prothrombin Time: 12.8 seconds (ref 11.4–15.2)

## 2020-01-13 MED ORDER — VITAMIN D3 25 MCG (1000 UNIT) PO TABS
2000.0000 [IU] | ORAL_TABLET | Freq: Every day | ORAL | Status: DC
  Administered 2020-01-13 – 2020-01-14 (×2): 2000 [IU] via ORAL
  Filled 2020-01-13 (×4): qty 2

## 2020-01-13 MED ORDER — KETOROLAC TROMETHAMINE 0.5 % OP SOLN
1.0000 [drp] | Freq: Every day | OPHTHALMIC | Status: DC
  Administered 2020-01-13 – 2020-01-14 (×2): 1 [drp] via OPHTHALMIC
  Filled 2020-01-13: qty 3

## 2020-01-13 MED ORDER — PANTOPRAZOLE SODIUM 40 MG PO TBEC: 40.0000 mg | DELAYED_RELEASE_TABLET | Freq: Every day | ORAL | Status: DC | PRN

## 2020-01-13 MED ORDER — GABAPENTIN 100 MG PO CAPS
100.0000 mg | ORAL_CAPSULE | Freq: Every day | ORAL | Status: DC
  Administered 2020-01-13: 100 mg via ORAL
  Filled 2020-01-13: qty 1

## 2020-01-13 MED ORDER — ALBUTEROL SULFATE (2.5 MG/3ML) 0.083% IN NEBU: 3.0000 mL | INHALATION_SOLUTION | RESPIRATORY_TRACT | Status: DC | PRN

## 2020-01-13 MED ORDER — IOHEXOL 300 MG/ML  SOLN
85.0000 mL | Freq: Once | INTRAMUSCULAR | Status: AC | PRN
  Administered 2020-01-13: 85 mL via INTRAVENOUS

## 2020-01-13 MED ORDER — LIDOCAINE-PRILOCAINE 2.5-2.5 % EX CREA: TOPICAL_CREAM | Freq: Once | CUTANEOUS | Status: AC

## 2020-01-13 MED ORDER — SIMVASTATIN 20 MG PO TABS
40.0000 mg | ORAL_TABLET | Freq: Every day | ORAL | Status: DC
  Administered 2020-01-13: 21:00:00 40 mg via ORAL
  Filled 2020-01-13: qty 2

## 2020-01-13 MED ORDER — HYDROCODONE-ACETAMINOPHEN 5-325 MG PO TABS: 1.0000 | ORAL_TABLET | ORAL | Status: DC | PRN

## 2020-01-13 MED ORDER — DM-GUAIFENESIN ER 30-600 MG PO TB12
1.0000 | ORAL_TABLET | Freq: Two times a day (BID) | ORAL | Status: DC
  Administered 2020-01-13 – 2020-01-14 (×3): 1 via ORAL
  Filled 2020-01-13 (×3): qty 1

## 2020-01-13 MED ORDER — FLUTICASONE PROPIONATE 50 MCG/ACT NA SUSP
1.0000 | Freq: Every day | NASAL | Status: DC
  Administered 2020-01-13 – 2020-01-14 (×2): 1 via NASAL
  Filled 2020-01-13: qty 16

## 2020-01-13 MED ORDER — TRIAMTERENE-HCTZ 37.5-25 MG PO CAPS: 1.0000 | ORAL_CAPSULE | Freq: Every day | ORAL | Status: DC

## 2020-01-13 MED ORDER — LIDOCAINE VISCOUS HCL 2 % MT SOLN
5.0000 mL | Freq: Four times a day (QID) | OROMUCOSAL | Status: DC | PRN
  Filled 2020-01-13: qty 5

## 2020-01-13 MED ORDER — ACETAMINOPHEN 650 MG RE SUPP: 650.0000 mg | Freq: Four times a day (QID) | RECTAL | Status: DC | PRN

## 2020-01-13 MED ORDER — PREDNISOLONE ACETATE 1 % OP SUSP
1.0000 [drp] | Freq: Two times a day (BID) | OPHTHALMIC | Status: DC
  Administered 2020-01-13 – 2020-01-14 (×2): 1 [drp] via OPHTHALMIC
  Filled 2020-01-13: qty 1

## 2020-01-13 MED ORDER — ACETAMINOPHEN 325 MG PO TABS: 650.0000 mg | ORAL_TABLET | Freq: Four times a day (QID) | ORAL | Status: DC | PRN

## 2020-01-13 MED ORDER — BILBERRY PLUS LUTEIN 5-1000 MCG-MG PO CAPS: 1.0000 | ORAL_CAPSULE | Freq: Every day | ORAL | Status: DC

## 2020-01-13 MED ORDER — DOCUSATE SODIUM 100 MG PO CAPS
100.0000 mg | ORAL_CAPSULE | Freq: Two times a day (BID) | ORAL | Status: DC
  Administered 2020-01-14: 09:00:00 100 mg via ORAL
  Filled 2020-01-13 (×2): qty 1

## 2020-01-13 MED ORDER — ADULT MULTIVITAMIN W/MINERALS CH
1.0000 | ORAL_TABLET | Freq: Every day | ORAL | Status: DC
  Administered 2020-01-13 – 2020-01-14 (×2): 1 via ORAL
  Filled 2020-01-13 (×2): qty 1

## 2020-01-13 MED ORDER — LIDOCAINE-PRILOCAINE 2.5-2.5 % EX CREA
1.0000 "application " | TOPICAL_CREAM | CUTANEOUS | Status: DC | PRN
  Filled 2020-01-13: qty 5

## 2020-01-13 MED ORDER — ONDANSETRON HCL 4 MG/2ML IJ SOLN: 4.0000 mg | Freq: Four times a day (QID) | INTRAMUSCULAR | Status: DC | PRN

## 2020-01-13 MED ORDER — TRIAMTERENE-HCTZ 37.5-25 MG PO TABS
1.0000 | ORAL_TABLET | Freq: Every day | ORAL | Status: DC
  Administered 2020-01-14: 1 via ORAL
  Filled 2020-01-13: qty 1

## 2020-01-13 MED ORDER — LOPERAMIDE HCL 2 MG PO CAPS: 2.0000 mg | ORAL_CAPSULE | ORAL | Status: DC | PRN

## 2020-01-13 MED ORDER — LUTEIN 40 MG PO CAPS: 40.0000 mg | ORAL_CAPSULE | Freq: Every day | ORAL | Status: DC

## 2020-01-13 MED ORDER — MAGIC MOUTHWASH W/LIDOCAINE: 5.0000 mL | Freq: Four times a day (QID) | ORAL | Status: DC | PRN

## 2020-01-13 MED ORDER — DICLOFENAC SODIUM 1 % EX GEL
2.0000 g | Freq: Four times a day (QID) | CUTANEOUS | Status: DC | PRN
  Filled 2020-01-13: qty 100

## 2020-01-13 MED ORDER — MAGIC MOUTHWASH
5.0000 mL | Freq: Four times a day (QID) | ORAL | Status: DC | PRN
  Filled 2020-01-13: qty 5

## 2020-01-13 MED ORDER — ONDANSETRON HCL 4 MG PO TABS: 4.0000 mg | ORAL_TABLET | Freq: Four times a day (QID) | ORAL | Status: DC | PRN

## 2020-01-13 MED ORDER — FIBER ADULT GUMMIES 2 G PO CHEW: CHEWABLE_TABLET | Freq: Every day | ORAL | Status: DC

## 2020-01-13 MED ORDER — LIDOCAINE-PRILOCAINE 2.5-2.5 % EX CREA
TOPICAL_CREAM | CUTANEOUS | Status: AC
  Filled 2020-01-13: qty 5

## 2020-01-13 MED ORDER — HEPARIN BOLUS VIA INFUSION
4500.0000 [IU] | Freq: Once | INTRAVENOUS | Status: AC
  Administered 2020-01-13: 12:00:00 4500 [IU] via INTRAVENOUS
  Filled 2020-01-13: qty 4500

## 2020-01-13 MED ORDER — HEPARIN (PORCINE) 25000 UT/250ML-% IV SOLN
1200.0000 [IU]/h | INTRAVENOUS | Status: DC
  Administered 2020-01-13: 1200 [IU]/h via INTRAVENOUS
  Filled 2020-01-13: qty 250

## 2020-01-13 MED ORDER — HEPARIN (PORCINE) 25000 UT/250ML-% IV SOLN
1000.0000 [IU]/h | INTRAVENOUS | Status: DC
  Administered 2020-01-13 – 2020-01-14 (×2): 1000 [IU]/h via INTRAVENOUS
  Filled 2020-01-13: qty 250

## 2020-01-13 MED ORDER — LORATADINE 10 MG PO TABS
10.0000 mg | ORAL_TABLET | Freq: Every day | ORAL | Status: DC
  Administered 2020-01-14: 10 mg via ORAL
  Filled 2020-01-13: qty 1

## 2020-01-13 NOTE — Consult Note (Signed)
ANTICOAGULATION CONSULT NOTE - Initial Consult  Pharmacy Consult for Heparin Infusion Dosing  Indication: pulmonary embolus  Allergies  Allergen Reactions  . Sulfamethoxazole-Trimethoprim Other (See Comments)    Also said she had chills and pressure in nose.    Patient Measurements: Height: 5\' 5"  (165.1 cm) Weight: 172 lb (78 kg) IBW/kg (Calculated) : 57 Heparin Dosing Weight: 73kg   Vital Signs: Temp: 98.3 F (36.8 C) (03/30 0928) Temp Source: Oral (03/30 0928) BP: 134/55 (03/30 0928) Pulse Rate: 86 (03/30 0928)  Labs: No results for input(s): HGB, HCT, PLT, APTT, LABPROT, INR, HEPARINUNFRC, HEPRLOWMOCWT, CREATININE, CKTOTAL, CKMB, TROPONINIHS in the last 72 hours.  Estimated Creatinine Clearance: 57.7 mL/min (by C-G formula based on SCr of 0.91 mg/dL).   Medical History: Past Medical History:  Diagnosis Date  . Arthritis   . Cancer ([place])    rectal  . GERD (gastroesophageal reflux disease)   . Hypercholesteremia   . Hypertension   . Hypertension   . Lower extremity edema   . Urinary incontinence      Assessment: Pharmacy consulted for heparin infusion dosing and monitoring in 74 yo female sent to ED from [facility] for abnormal labs and possible PE.   CT Chest: Nonocclusive acute pulmonary embolus in the right main pulmonary artery extending out into the interlobar pulmonary artery. Tiny string like pulmonary embolus identified in subsegmental arteries to the right lower lobe.  Goal of Therapy:  Heparin level 0.3-0.7 units/ml Monitor platelets by anticoagulation protocol: Yes   Plan:  Baseline Labs ordered  Give 4500 units bolus x 1 Start heparin infusion at 1200 units/hr Check anti-Xa level in 8 hours and daily while on heparin Continue to monitor H&H and platelets  Pernell Dupre, PharmD, BCPS Clinical Pharmacist 01/13/2020 9:54 AM

## 2020-01-13 NOTE — ED Notes (Signed)
Ambulated to toilet without assistance. Patient was given a fresh warm blanket. Call light within reach.

## 2020-01-13 NOTE — ED Triage Notes (Signed)
Pt reports she was sent from cancer center for labs, something was abnormal so she was sent for a CT and then sent to ED from CT for PE. Pt denies pain or sob.

## 2020-01-13 NOTE — ED Notes (Addendum)
Patient states she would like to have her EMLA in place for 90 minutes prior to port being accessed. This RN unable to draw labs off of IV and no additional sticks wanted. Will draw labs off of port when accessed.

## 2020-01-13 NOTE — ED Notes (Signed)
Repeat blue top collected and sent to lab.
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
## 2020-01-13 NOTE — Consult Note (Addendum)
ANTICOAGULATION CONSULT NOTE - Initial Consult  Pharmacy Consult for Heparin Infusion Dosing  Indication: pulmonary embolus  Allergies  Allergen Reactions  . Sulfamethoxazole-Trimethoprim Other (See Comments)    Also said she had chills and pressure in nose.    Patient Measurements: Height: 5\' 5"  (165.1 cm) Weight: 172 lb (78 kg) IBW/kg (Calculated) : 57 Heparin Dosing Weight: 73kg   Vital Signs: Temp: 98.4 F (36.9 C) (03/30 1958) Temp Source: Oral (03/30 1958) BP: 117/59 (03/30 1958) Pulse Rate: 73 (03/30 1958)  Labs: Recent Labs    01/13/20 0949 01/13/20 1137 01/13/20 1946  HGB 11.8*  --   --   HCT 37.8  --   --   PLT 167  --   --   APTT  --  78*  --   LABPROT  --  12.8  --   INR  --  1.0  --   HEPARINUNFRC  --   --  1.48*  CREATININE 0.86  --   --   TROPONINIHS 5 6  --     Estimated Creatinine Clearance: 61 mL/min (by C-G formula based on SCr of 0.86 mg/dL).   Medical History: Past Medical History:  Diagnosis Date  . Arthritis   . Cancer ([place])    rectal  . GERD (gastroesophageal reflux disease)   . Hypercholesteremia   . Hypertension   . Hypertension   . Lower extremity edema   . Urinary incontinence      Assessment: Pharmacy consulted for heparin infusion dosing and monitoring in 74 yo female sent to ED from [facility] for abnormal labs and possible PE.   CT Chest: Nonocclusive acute pulmonary embolus in the right main pulmonary artery extending out into the interlobar pulmonary artery. Tiny string like pulmonary embolus identified in subsegmental arteries to the right lower lobe.  Goal of Therapy:  Heparin level 0.3-0.7 units/ml Monitor platelets by anticoagulation protocol: Yes   Plan:  Baseline Labs ordered  Give 4500 units bolus x 1 Start heparin infusion at 1200 units/hr Check anti-Xa level in 8 hours and daily while on heparin Continue to monitor H&H and platelets   3/30:  HL @ 1946 = 1.48 Spoke with lab, phlebotomist drew  the specimen but they cannot confirm it was drawn correctly.  Will order stat repeat HL @ 2130.   3/30:  HL @ 2130 = 1.37 Will hold heparin infusion for 1 hr and restart at 1000 units/hr @ 2330. Will recheck HL 6 hrs after restart.   Orene Desanctis, PharmD Clinical Pharmacist 01/13/2020 9:31 PM

## 2020-01-13 NOTE — H&P (Signed)
History and Physical    Jacqueline Yoder [id number] DOB: 08-07-1946 DOA: 01/13/2020  Referring MD/NP/PA:   PCP: Kirk Ruths, MD   Patient coming from:  The patient is coming from home.  At baseline, pt is independent for most of ADL.        Chief Complaint: abnormal image findings  HPI: Jacqueline Yoder is a 74 y.o. female with medical history significant of rectal cancer (s/p of surgery, colostomy and chemotherapy), hypertension, hyperlipidemia, GERD, urinary incontinence, COVID-19 infection 09/2019, who presents with abnormal imaging findings.  Pt had follow up visit in cancer center where she had CT scan of chest which showed pulmonary embolism. Patient is asymptomatic.  Patient does not have chest pain, shortness breath, fever or chills.  No tenderness in calf areas.  No nausea vomiting, diarrhea, abdominal pain, symptoms of UTI or unilateral weakness.  Patient has mild generalized weakness.   ED Course: pt was found to have WBC 3.7, troponin 5, BNP 70, pending COVID-19 PCR electrolyte renal function okay, temperature normal, blood pressure 134/55, heart rate 86, RR 17, oxygen saturation 92% on room air. Pt is placed on med-surg bed for obs.  # LE doppler showed:  1. Positive for DVT throughout the left lower extremity veins with  nonocclusive thrombus extending from the common femoralthrough the posterior tibial veins. 2. Nonocclusive thrombus in the right profundus femoral vein. No other DVT in the right lower extremity.  CT scan of abd/pelvis and chest: 1. No definite evidence for metastatic disease in the chest, abdomen, or pelvis. This is the patient's first postsurgical study in our system and will now establish baseline for follow-up imaging. 2. Nonocclusive acute pulmonary embolus in the right main pulmonary artery extending out into the interlobar pulmonary artery. Tiny string like pulmonary embolus identified in subsegmental arteries to the right lower lobe. 3. Trace  presacral soft tissue density is probably treatment related. Attention on follow-up recommended. 4. Aortic Atherosclerosis (ICD10-I70.0).  Review of Systems:   General: no fevers, chills, no body weight gain, has fatigue HEENT: no blurry vision, hearing changes or sore throat Respiratory: no dyspnea, coughing, wheezing CV: no chest pain, no palpitations GI: no nausea, vomiting, abdominal pain, diarrhea, constipation GU: no dysuria, burning on urination, increased urinary frequency, hematuria  Ext: no leg edema Neuro: no unilateral weakness, numbness, or tingling, no vision change or hearing loss Skin: no rash, no skin tear. MSK: No muscle spasm, no deformity, no limitation of range of movement in spin Heme: No easy bruising.  Travel history: No recent long distant travel.  Allergy:  Allergies  Allergen Reactions  . Sulfamethoxazole-Trimethoprim Other (See Comments)    Also said she had chills and pressure in nose.    Past Medical History:  Diagnosis Date  . Arthritis   . Cancer ([place])    rectal  . GERD (gastroesophageal reflux disease)   . Hypercholesteremia   . Hypertension   . Hypertension   . Lower extremity edema   . Urinary incontinence     Past Surgical History:  Procedure Laterality Date  . CHOLECYSTECTOMY  1971  . COLONOSCOPY WITH PROPOFOL N/A 12/03/2018   Procedure: COLONOSCOPY WITH PROPOFOL;  Surgeon: Lucilla Lame, MD;  Location: [facility] ENDOSCOPY;  Service: Endoscopy;  Laterality: N/A;  . FLEXIBLE SIGMOIDOSCOPY N/A 12/06/2018   Procedure: FLEXIBLE SIGMOIDOSCOPY;  Surgeon: [name], MD;  Location: [facility] ENDOSCOPY;  Service: Endoscopy;  Laterality: N/A;  . LAPAROSCOPIC COLOSTOMY  01/06/2019  . PORTACATH PLACEMENT N/A 04/03/2019   Procedure:  INSERTION PORT-A-CATH;  Surgeon: Jules Husbands, MD;  Location: [facility] ORS;  Service: General;  Laterality: N/A;    Social History:  reports that she quit smoking about 42 years ago. She has never used smokeless tobacco. She  reports that she does not drink alcohol or use drugs.  Family History:  Family History  Problem Relation Age of Onset  . Colon cancer Brother   . Prostate cancer Brother   . Hypertension Mother   . Stroke Mother   . Kidney failure Father   . Breast cancer Neg Hx   . Ovarian cancer Neg Hx      Prior to Admission medications   Medication Sig Start Date End Date Taking? Authorizing Provider  chlorhexidine (PERIDEX) 0.12 % solution Use as directed 15 mLs in the mouth or throat 2 (two) times daily. 04/23/19   Earlie Server, MD  Cholecalciferol (VITAMIN D3) 2000 units capsule Take 2,000 Units by mouth daily.    [provider]  Cinnamon Bark POWD Take by mouth.    [provider]  diclofenac sodium (VOLTAREN) 1 % GEL Apply 2 g topically 4 (four) times daily as needed (joint pain).  05/09/18   [provider]  docusate sodium (COLACE) 100 MG capsule Take 100 mg by mouth 2 (two) times daily.    [provider]  fexofenadine (ALLEGRA) 180 MG tablet Take 180 mg by mouth daily.    [provider]  FIBER ADULT GUMMIES PO Take 1 Dose by mouth daily.    [provider]  fluticasone (FLONASE) 50 MCG/ACT nasal spray Place 1 spray into both nostrils 2 (two) times a day.  05/07/18   [provider]  gabapentin (NEURONTIN) 100 MG capsule Take 1 capsule (100 mg total) by mouth at bedtime. 10/06/19   Earlie Server, MD  HYDROcodone-acetaminophen (NORCO/VICODIN) 5-325 MG tablet Take 1 tablet by mouth every 4 (four) hours as needed for moderate pain. Patient not taking: Reported on 10/03/2019 04/03/19   Caroleen Hamman F, MD  ipratropium (ATROVENT) 0.03 % nasal spray  11/06/19   [provider]  ketorolac (ACULAR) 0.4 % SOLN Place 1 drop into both eyes daily.  06/26/18   [provider]  lidocaine-prilocaine (EMLA) cream Apply 1 application topically as needed. 04/03/19   Earlie Server, MD  loperamide (IMODIUM) 2 MG capsule Take 1 capsule (2 mg total) by  mouth See admin instructions. With onset of loose stool, take 4mg  followed by 2mg  every 2 hours,  Maximum: 16 mg/day 03/07/19   Earlie Server, MD  Lutein 40 MG CAPS Take 40 mg by mouth daily.     [provider]  Lutein-Bilberry (BILBERRY PLUS LUTEIN) 02-999 MCG-MG CAPS Take 1 capsule by mouth daily.  03/19/17   [provider]  magic mouthwash w/lidocaine SOLN Take 5 mLs by mouth 4 (four) times daily as needed for mouth pain. Sig: Swish/Swallow 5-10 ml four times a day as needed 10/13/19   Earlie Server, MD  Multiple Vitamins-Minerals (ONE-A-DAY WOMENS 50 PLUS PO) Take 1 tablet by mouth daily.     [provider]  nystatin (MYCOSTATIN/NYSTOP) powder Apply topically 4 (four) times daily. 03/13/19   Earlie Server, MD  ondansetron (ZOFRAN) 8 MG tablet Take 1 tablet (8 mg total) by mouth every 8 (eight) hours as needed for nausea, vomiting or refractory nausea / vomiting. 03/13/19   Earlie Server, MD  pantoprazole (PROTONIX) 40 MG tablet Take 1 tablet (40 mg total) by mouth as needed. Patient taking differently:  Take 40 mg by mouth daily as needed (heartburn).  03/13/19   Earlie Server, MD  potassium chloride SA (K-DUR) 20 MEQ tablet Take 20 mEq by mouth daily.    [provider]  prednisoLONE acetate (PRED FORTE) 1 % ophthalmic suspension Place 1 drop into both eyes 2 (two) times a day.  07/23/18   [provider]  prochlorperazine (COMPAZINE) 10 MG tablet Take 1 tablet (10 mg total) by mouth every 6 (six) hours as needed (Nausea or vomiting). 04/07/19   Earlie Server, MD  simvastatin (ZOCOR) 40 MG tablet Take 40 mg by mouth at bedtime.  08/09/18   [provider]  triamterene-hydrochlorothiazide (DYAZIDE) 37.5-25 MG capsule Take 1 capsule by mouth daily.  10/05/17   [provider]    Physical Exam: Vitals:   01/13/20 1330 01/13/20 1430 01/13/20 1500 01/13/20 1615  BP: (!) 125/96 (!) 106/59 117/66 129/68  Pulse: 80 74 73 76  Resp: (!) 21 20 17 18   Temp:    98.7 F (37.1  C)  TempSrc:      SpO2: 98% 98% 97% 97%  Weight:      Height:       General: Not in acute distress HEENT:       Eyes: PERRL, EOMI, no scleral icterus.       ENT: No discharge from the ears and nose, no pharynx injection, no tonsillar enlargement.        Neck: No JVD, no bruit, no mass felt. Heme: No neck lymph node enlargement. Cardiac: S1/S2, RRR, No murmurs, No gallops or rubs. Respiratory:  No rales, wheezing, rhonchi or rubs. GI: Soft, nondistended, nontender, no rebound pain, no organomegaly, BS present. S/p of colostomy. GU: No hematuria Ext: No pitting leg edema bilaterally. 2+DP/PT pulse bilaterally. Musculoskeletal: No joint deformities, No joint redness or warmth, no limitation of ROM in spin. Skin: No rashes.  Neuro: Alert, oriented X3, cranial nerves II-XII grossly intact, moves all extremities normally. Psych: Patient is not psychotic, no suicidal or hemocidal ideation.  Labs on Admission: I have personally reviewed following labs and imaging studies  CBC: Recent Labs  Lab 01/13/20 0949  WBC 3.7*  NEUTROABS 2.3  HGB 11.8*  HCT 37.8  MCV 92.9  PLT [id number]   Basic Metabolic Panel: Recent Labs  Lab 01/13/20 0949  NA 139  K 3.6  CL 100  CO2 28  GLUCOSE 86  BUN 11  CREATININE 0.86  CALCIUM 9.7   GFR: Estimated Creatinine Clearance: 61 mL/min (by C-G formula based on SCr of 0.86 mg/dL). Liver Function Tests: Recent Labs  Lab 01/13/20 0949  AST 28  ALT 13  ALKPHOS 41  BILITOT 0.6  PROT 7.4  ALBUMIN 4.3   No results for input(s): LIPASE, AMYLASE in the last 168 hours. No results for input(s): AMMONIA in the last 168 hours. Coagulation Profile: Recent Labs  Lab 01/13/20 1137  INR 1.0   Cardiac Enzymes: No results for input(s): CKTOTAL, CKMB, CKMBINDEX, TROPONINI in the last 168 hours. BNP (last 3 results) No results for input(s): PROBNP in the last 8760 hours. HbA1C: No results for input(s): HGBA1C in the last 72 hours. CBG: No results for  input(s): GLUCAP in the last 168 hours. Lipid Profile: No results for input(s): CHOL, HDL, LDLCALC, TRIG, CHOLHDL, LDLDIRECT in the last 72 hours. Thyroid Function Tests: No results for input(s): TSH, T4TOTAL, FREET4, T3FREE, THYROIDAB in the last 72 hours. Anemia Panel: No results for input(s): VITAMINB12, FOLATE, FERRITIN, TIBC, IRON, RETICCTPCT  in the last 72 hours. Urine analysis: No results found for: COLORURINE, APPEARANCEUR, LABSPEC, PHURINE, GLUCOSEU, HGBUR, BILIRUBINUR, KETONESUR, PROTEINUR, UROBILINOGEN, NITRITE, LEUKOCYTESUR Sepsis Labs: @LABRCNTIP (procalcitonin:4,lacticidven:4) ) Recent Results (from the past 240 hour(s))  Respiratory Panel by RT PCR (Flu A&B, Covid) - Nasopharyngeal Swab     Status: None   Collection Time: 01/13/20  9:49 AM   Specimen: Nasopharyngeal Swab  Result Value Ref Range Status   SARS Coronavirus 2 by RT PCR NEGATIVE NEGATIVE Final    Comment: (NOTE) SARS-CoV-2 target nucleic acids are NOT DETECTED. The SARS-CoV-2 RNA is generally detectable in upper respiratoy specimens during the acute phase of infection. The lowest concentration of SARS-CoV-2 viral copies this assay can detect is 131 copies/mL. A negative result does not preclude SARS-Cov-2 infection and should not be used as the sole basis for treatment or other patient management decisions. A negative result may occur with  improper specimen collection/handling, submission of specimen other than nasopharyngeal swab, presence of viral mutation(s) within the areas targeted by this assay, and inadequate number of viral copies (<131 copies/mL). A negative result must be combined with clinical observations, patient history, and epidemiological information. The expected result is Negative. Fact Sheet for Patients:  PinkCheek.be Fact Sheet for Healthcare Providers:  GravelBags.it This test is not yet ap proved or cleared by the [place] FDA and  has been authorized for detection and/or diagnosis of SARS-CoV-2 by FDA under an Emergency Use Authorization (EUA). This EUA will remain  in effect (meaning this test can be used) for the duration of the COVID-19 declaration under Section 564(b)(1) of the Act, 21 U.S.C. section 360bbb-3(b)(1), unless the authorization is terminated or revoked sooner.    Influenza A by PCR NEGATIVE NEGATIVE Final   Influenza B by PCR NEGATIVE NEGATIVE Final    Comment: (NOTE) The Xpert Xpress SARS-CoV-2/FLU/RSV assay is intended as an aid in  the diagnosis of influenza from Nasopharyngeal swab specimens and  should not be used as a sole basis for treatment. Nasal washings and  aspirates are unacceptable for Xpert Xpress SARS-CoV-2/FLU/RSV  testing. Fact Sheet for Patients: PinkCheek.be Fact Sheet for Healthcare Providers: GravelBags.it This test is not yet approved or cleared by the [place] FDA and  has been authorized for detection and/or diagnosis of SARS-CoV-2 by  FDA under an Emergency Use Authorization (EUA). This EUA will remain  in effect (meaning this test can be used) for the duration of the  Covid-19 declaration under Section 564(b)(1) of the Act, 21  U.S.C. section 360bbb-3(b)(1), unless the authorization is  terminated or revoked. Performed at [facility], [address], [place], [place] [address]      Radiological Exams on Admission: CT Chest W Contrast  Result Date: 01/13/2020 CLINICAL DATA:  Rectal cancer. Restaging. EXAM: CT CHEST, ABDOMEN, AND PELVIS WITH CONTRAST TECHNIQUE: Multidetector CT imaging of the chest, abdomen and pelvis was performed following the standard protocol during bolus administration of intravenous contrast. CONTRAST:  83mL OMNIPAQUE IOHEXOL 300 MG/ML  SOLN COMPARISON:  11/19/2018 FINDINGS: CT CHEST FINDINGS Cardiovascular: The heart size is normal. No substantial  pericardial effusion. Coronary artery calcification is evident. Atherosclerotic calcification is noted in the wall of the thoracic aorta. Right Port-A-Cath tip is positioned at the SVC/RA junction. Nonocclusive acute pulmonary embolus identified in the right main pulmonary artery extending out into the interlobar pulmonary artery. There is a tiny string like pulmonary embolus in subsegmental arteries to the right lower lobe (image 36/series 2). Mediastinum/Nodes: No mediastinal lymphadenopathy. There  is no hilar lymphadenopathy. The esophagus has normal imaging features. There is no axillary lymphadenopathy. Lungs/Pleura: No suspicious pulmonary nodule or mass. No consolidation. No effusion. Minimal atelectasis noted posterior right base. Musculoskeletal: No worrisome lytic or sclerotic osseous abnormality. CT ABDOMEN PELVIS FINDINGS Hepatobiliary: No suspicious focal abnormality within the liver parenchyma. Gallbladder surgically absent. No intrahepatic or extrahepatic biliary dilation. Pancreas: No focal mass lesion. No dilatation of the main duct. No intraparenchymal cyst. No peripancreatic edema. Spleen: No splenomegaly. No focal mass lesion. Adrenals/Urinary Tract: No adrenal nodule or mass. Bilateral renal cysts again noted. There are tiny hypodensities in both kidneys that are too small to characterize, similar to prior. No evidence for hydroureter. The urinary bladder appears normal for the degree of distention. Stomach/Bowel: Stomach is unremarkable. No gastric wall thickening. No evidence of outlet obstruction. Duodenum is normally positioned as is the ligament of Treitz. No small bowel wall thickening. No small bowel dilatation. The appendix is normal. Sigmoid end colostomy noted. Vascular/Lymphatic: There is abdominal aortic atherosclerosis without aneurysm. There is no gastrohepatic or hepatoduodenal ligament lymphadenopathy. No retroperitoneal or mesenteric lymphadenopathy. No pelvic sidewall  lymphadenopathy. Laminar flow of un opacified blood identified in both common femoral veins. Reproductive: Uterus surgically absent. There is no adnexal mass. Skin thickening in the region the vaginal introitus may be related to prior surgery/radiation. Other: No intraperitoneal free fluid. There is some trace presacral soft tissue likely treatment related. Musculoskeletal: No worrisome lytic or sclerotic osseous abnormality. IMPRESSION: 1. No definite evidence for metastatic disease in the chest, abdomen, or pelvis. This is the patient's first postsurgical study in our system and will now establish baseline for follow-up imaging. 2. Nonocclusive acute pulmonary embolus in the right main pulmonary artery extending out into the interlobar pulmonary artery. Tiny string like pulmonary embolus identified in subsegmental arteries to the right lower lobe. 3. Trace presacral soft tissue density is probably treatment related. Attention on follow-up recommended. 4. Aortic Atherosclerosis (ICD10-I70.0). Critical Value/emergent results were called by telephone at the time of interpretation on 01/13/2020 at 9:13 am to provider [facility] , who verbally acknowledged these results. After this conversation, I discussed the findings of today's study with the patient and her daughter. The patient was in no acute distress and was not short of breath. They will proceed directly to the [facility] hospital emergency department after leaving the imaging center. We will contact triage at the emergency department and alert them to the patient's arrival. Electronically Signed   By: Misty Stanley M.D.   On: 01/13/2020 09:14   CT Abdomen Pelvis W Contrast  Result Date: 01/13/2020 CLINICAL DATA:  Rectal cancer. Restaging. EXAM: CT CHEST, ABDOMEN, AND PELVIS WITH CONTRAST TECHNIQUE: Multidetector CT imaging of the chest, abdomen and pelvis was performed following the standard protocol during bolus administration of intravenous contrast.  CONTRAST:  40mL OMNIPAQUE IOHEXOL 300 MG/ML  SOLN COMPARISON:  11/19/2018 FINDINGS: CT CHEST FINDINGS Cardiovascular: The heart size is normal. No substantial pericardial effusion. Coronary artery calcification is evident. Atherosclerotic calcification is noted in the wall of the thoracic aorta. Right Port-A-Cath tip is positioned at the SVC/RA junction. Nonocclusive acute pulmonary embolus identified in the right main pulmonary artery extending out into the interlobar pulmonary artery. There is a tiny string like pulmonary embolus in subsegmental arteries to the right lower lobe (image 36/series 2). Mediastinum/Nodes: No mediastinal lymphadenopathy. There is no hilar lymphadenopathy. The esophagus has normal imaging features. There is no axillary lymphadenopathy. Lungs/Pleura: No suspicious pulmonary nodule or mass. No consolidation.  No effusion. Minimal atelectasis noted posterior right base. Musculoskeletal: No worrisome lytic or sclerotic osseous abnormality. CT ABDOMEN PELVIS FINDINGS Hepatobiliary: No suspicious focal abnormality within the liver parenchyma. Gallbladder surgically absent. No intrahepatic or extrahepatic biliary dilation. Pancreas: No focal mass lesion. No dilatation of the main duct. No intraparenchymal cyst. No peripancreatic edema. Spleen: No splenomegaly. No focal mass lesion. Adrenals/Urinary Tract: No adrenal nodule or mass. Bilateral renal cysts again noted. There are tiny hypodensities in both kidneys that are too small to characterize, similar to prior. No evidence for hydroureter. The urinary bladder appears normal for the degree of distention. Stomach/Bowel: Stomach is unremarkable. No gastric wall thickening. No evidence of outlet obstruction. Duodenum is normally positioned as is the ligament of Treitz. No small bowel wall thickening. No small bowel dilatation. The appendix is normal. Sigmoid end colostomy noted. Vascular/Lymphatic: There is abdominal aortic atherosclerosis without  aneurysm. There is no gastrohepatic or hepatoduodenal ligament lymphadenopathy. No retroperitoneal or mesenteric lymphadenopathy. No pelvic sidewall lymphadenopathy. Laminar flow of un opacified blood identified in both common femoral veins. Reproductive: Uterus surgically absent. There is no adnexal mass. Skin thickening in the region the vaginal introitus may be related to prior surgery/radiation. Other: No intraperitoneal free fluid. There is some trace presacral soft tissue likely treatment related. Musculoskeletal: No worrisome lytic or sclerotic osseous abnormality. IMPRESSION: 1. No definite evidence for metastatic disease in the chest, abdomen, or pelvis. This is the patient's first postsurgical study in our system and will now establish baseline for follow-up imaging. 2. Nonocclusive acute pulmonary embolus in the right main pulmonary artery extending out into the interlobar pulmonary artery. Tiny string like pulmonary embolus identified in subsegmental arteries to the right lower lobe. 3. Trace presacral soft tissue density is probably treatment related. Attention on follow-up recommended. 4. Aortic Atherosclerosis (ICD10-I70.0). Critical Value/emergent results were called by telephone at the time of interpretation on 01/13/2020 at 9:13 am to provider [facility] , who verbally acknowledged these results. After this conversation, I discussed the findings of today's study with the patient and her daughter. The patient was in no acute distress and was not short of breath. They will proceed directly to the [facility] hospital emergency department after leaving the imaging center. We will contact triage at the emergency department and alert them to the patient's arrival. Electronically Signed   By: Misty Stanley M.D.   On: 01/13/2020 09:14   US Venous Img Lower Bilateral (DVT)  Result Date: 01/13/2020 CLINICAL DATA:  Acute pulmonary embolus. EXAM: BILATERAL LOWER EXTREMITY VENOUS DOPPLER ULTRASOUND  TECHNIQUE: Gray-scale sonography with compression, as well as color and duplex ultrasound, were performed to evaluate the deep venous system(s) from the level of the common femoral vein through the popliteal and proximal calf veins. COMPARISON:  Chest CT earlier today, no prior lower extremity duplex. FINDINGS: VENOUS Right lower extremity: Normal compressibility of the common femoral, superficial femoral, and popliteal veins, as well as the visualized calf veins. Doppler waveforms show normal direction of venous flow, normal respiratory phasicity and response to augmentation. There is nonocclusive thrombus in the profundus femoral vein. No other filling defects to suggest DVT on grayscale or color Doppler imaging. Left lower extremity: There is nonocclusive thrombus throughout the left lower extremity veins including the common femoral, profundus femoral, femoral, popliteal, and posterior tibial veins. Vessels are partially compressible. No thrombus in the peroneal vein. OTHER None. Limitations: none IMPRESSION: 1. Positive for DVT throughout the left lower extremity veins with nonocclusive thrombus extending from the common  femoral through the posterior tibial veins. 2. Nonocclusive thrombus in the right profundus femoral vein. No other DVT in the right lower extremity. These results will be called to the ordering clinician or representative by the Radiologist Assistant, and communication documented in the PACS or [employer]. Electronically Signed   By: Keith Rake M.D.   On: 01/13/2020 17:28     EKG: Independently reviewed.  Sinus rhythm, QTC 460, early R wave progression  Assessment/Plan Principal Problem:   Acute pulmonary embolism (HCC) Active Problems:   Hyperlipidemia   Rectal cancer (HCC)   Hypertension   GERD (gastroesophageal reflux disease)   DVT (deep venous thrombosis) (HCC)   Acute pulmonary embolism and DVT: Patient is asymptomatic.  Hemodynamically stable.  No oxygen  desaturation.  VVS, Dr. Delana Meyer is consulted.  -will place on tele bed for obs -heparin drip initiated -2D echocardiogram ordered -prn albuterol nebs and mucinex   Hyperlipidemia -zocor  Rectal cancer [facility]): s/p of surgery, chemotherapy, radiation therapy. -s/p colostomy.  No acute issues.  HTN:  -Continue home medications: Dyazide -hydralazine prn  GERD (gastroesophageal reflux disease) -Protonix   DVT ppx: On IV Heparin   Code Status: Full code Family Communication:    Yes, patient's daughter  at bed side Disposition Plan:  Anticipate discharge back to previous home environment Consults called:  VVS, Dr. Delana Meyer  Admission status: Med-surg bed for obs  Date of Service 01/13/2020    [facility]   If 7PM-7AM, please contact night-coverage [email or web address] 01/13/2020, 7:28 PM

## 2020-01-14 ENCOUNTER — Other Ambulatory Visit: Payer: Self-pay | Admitting: Oncology

## 2020-01-14 ENCOUNTER — Encounter: Payer: Self-pay | Admitting: Internal Medicine

## 2020-01-14 DIAGNOSIS — I824Y3 Acute embolism and thrombosis of unspecified deep veins of proximal lower extremity, bilateral: Secondary | ICD-10-CM | POA: Diagnosis not present

## 2020-01-14 DIAGNOSIS — D649 Anemia, unspecified: Secondary | ICD-10-CM

## 2020-01-14 DIAGNOSIS — C20 Malignant neoplasm of rectum: Secondary | ICD-10-CM

## 2020-01-14 DIAGNOSIS — I2699 Other pulmonary embolism without acute cor pulmonale: Secondary | ICD-10-CM | POA: Diagnosis not present

## 2020-01-14 LAB — CBC
HCT: 33 % — ABNORMAL LOW (ref 36.0–46.0)
Hemoglobin: 10.7 g/dL — ABNORMAL LOW (ref 12.0–15.0)
MCH: 29.2 pg (ref 26.0–34.0)
MCHC: 32.4 g/dL (ref 30.0–36.0)
MCV: 90.2 fL (ref 80.0–100.0)
Platelets: 167 10*3/uL (ref 150–400)
RBC: 3.66 MIL/uL — ABNORMAL LOW (ref 3.87–5.11)
RDW: 16.2 % — ABNORMAL HIGH (ref 11.5–15.5)
WBC: 4.7 10*3/uL (ref 4.0–10.5)
nRBC: 0 % (ref 0.0–0.2)

## 2020-01-14 LAB — HEPARIN LEVEL (UNFRACTIONATED): Heparin Unfractionated: 1.56 IU/mL — ABNORMAL HIGH (ref 0.30–0.70)

## 2020-01-14 LAB — BASIC METABOLIC PANEL
Anion gap: 8 (ref 5–15)
BUN: 11 mg/dL (ref 8–23)
CO2: 28 mmol/L (ref 22–32)
Calcium: 9.2 mg/dL (ref 8.9–10.3)
Chloride: 105 mmol/L (ref 98–111)
Creatinine, Ser: 0.81 mg/dL (ref 0.44–1.00)
GFR calc Af Amer: >60 mL/min (ref >60)
GFR calc non Af Amer: >60 mL/min (ref >60)
Glucose, Bld: 102 mg/dL — ABNORMAL HIGH (ref 70–99)
Potassium: 3.5 mmol/L (ref 3.5–5.1)
Sodium: 141 mmol/L (ref 135–145)

## 2020-01-14 LAB — ECHOCARDIOGRAM COMPLETE
Height: 65 in
Weight: 2752 oz

## 2020-01-14 MED ORDER — HEPARIN SOD (PORK) LOCK FLUSH 100 UNIT/ML IV SOLN
500.0000 [IU] | INTRAVENOUS | Status: AC | PRN
  Administered 2020-01-14: 500 [IU]
  Filled 2020-01-14: qty 5

## 2020-01-14 MED ORDER — APIXABAN 5 MG PO TABS: 5.0000 mg | ORAL_TABLET | Freq: Two times a day (BID) | ORAL | Status: DC

## 2020-01-14 MED ORDER — APIXABAN 5 MG PO TABS
10.0000 mg | ORAL_TABLET | Freq: Two times a day (BID) | ORAL | Status: DC
  Administered 2020-01-14: 10 mg via ORAL
  Filled 2020-01-14: qty 2

## 2020-01-14 MED ORDER — APIXABAN 5 MG PO TABS: 10.0000 mg | ORAL_TABLET | Freq: Two times a day (BID) | ORAL | 0 refills | Status: DC

## 2020-01-14 MED ORDER — HEPARIN (PORCINE) 25000 UT/250ML-% IV SOLN: 700.0000 [IU]/h | INTRAVENOUS | Status: DC

## 2020-01-14 MED ORDER — CHLORHEXIDINE GLUCONATE CLOTH 2 % EX PADS
6.0000 | MEDICATED_PAD | Freq: Every day | CUTANEOUS | Status: DC
  Administered 2020-01-14: 6 via TOPICAL

## 2020-01-14 MED ORDER — APIXABAN 5 MG PO TABS: 5.0000 mg | ORAL_TABLET | Freq: Two times a day (BID) | ORAL | 0 refills | Status: DC

## 2020-01-14 NOTE — Consult Note (Signed)
ANTICOAGULATION CONSULT NOTE - Initial Consult  Pharmacy Consult for Heparin Infusion Dosing  Indication: pulmonary embolus  Allergies  Allergen Reactions  . Sulfamethoxazole-Trimethoprim Other (See Comments)    Also said she had chills and pressure in nose.    Patient Measurements: Height: 5\' 5"  (165.1 cm) Weight: 171 lb 4.8 oz (77.7 kg) IBW/kg (Calculated) : 57 Heparin Dosing Weight: 73kg   Vital Signs: Temp: 98.7 F (37.1 C) (03/31 0810) Temp Source: Oral (03/31 0810) BP: 114/63 (03/31 0810) Pulse Rate: 66 (03/31 0810)  Labs: Recent Labs    01/13/20 0949 01/13/20 1137 01/13/20 1946 01/13/20 2139 01/14/20 0724  HGB 11.8*  --   --   --  10.7*  HCT 37.8  --   --   --  33.0*  PLT 167  --   --   --  167  APTT  --  78*  --   --   --   LABPROT  --  12.8  --   --   --   INR  --  1.0  --   --   --   HEPARINUNFRC  --   --  1.48* 1.37* 1.56*  CREATININE 0.86  --   --   --  0.81  TROPONINIHS 5 6  --   --   --     Estimated Creatinine Clearance: 64.7 mL/min (by C-G formula based on SCr of 0.81 mg/dL).   Medical History: Past Medical History:  Diagnosis Date  . Arthritis   . Cancer ([place])    rectal  . GERD (gastroesophageal reflux disease)   . Hypercholesteremia   . Hypertension   . Hypertension   . Lower extremity edema   . Urinary incontinence      Assessment: Pharmacy consulted for heparin infusion dosing and monitoring in 74 yo female sent to ED from [facility] for abnormal labs and possible PE.   CT Chest: Nonocclusive acute pulmonary embolus in the right main pulmonary artery extending out into the interlobar pulmonary artery. Tiny string like pulmonary embolus identified in subsegmental arteries to the right lower lobe.  Goal of Therapy:  Heparin level 0.3-0.7 units/ml Monitor platelets by anticoagulation protocol: Yes   Plan:   -3/31 @ 0724 HL 1.56, supratherapeutic. Will hold heparin x 2 hours and re-start drip at 700 units/hr. -Re-check  heparin level in 8 hours after re-start -Daily CBC per protocol   [place] Resident 01/14/2020 8:49 AM

## 2020-01-14 NOTE — Consult Note (Addendum)
[place] Nurse ostomy consult note Consult requested to assist with ostomy supplies.  Pt had colostomy surgery performed at [facility] in 3/20.  She states she is independent with emptying but her daughter applies the pouch, since she has poor vision.  She uses a [employer].  Informed her we do not carry this brand in our formulary but will substitute a similar product.  Stoma type/location: Stoma is red and viable when visualized through the pouch, which is intact with good seal.  Pt states her daughter applied the pouch yesterday.  Output: mod amt liquid brown stool  Ostomy pouching: 1pc.  Education provided: Pt denies need for further education.  Left 2 one piece pouches in the room for staff use if necessary. Please re-consult if further assistance is needed.  Thank-you,  Julien Girt MSN, [place], [place], [place], [place]

## 2020-01-14 NOTE — Consult Note (Addendum)
ANTICOAGULATION CONSULT NOTE - Initial Consult  Pharmacy Consult for Apixaban Dosing  Indication: pulmonary embolus and DVTs  Allergies  Allergen Reactions  . Sulfamethoxazole-Trimethoprim Other (See Comments)    Also said she had chills and pressure in nose.    Patient Measurements: Height: 5\' 5"  (165.1 cm) Weight: 171 lb 4.8 oz (77.7 kg) IBW/kg (Calculated) : 57 Heparin Dosing Weight: 73kg   Vital Signs: Temp: 98.7 F (37.1 C) (03/31 0810) Temp Source: Oral (03/31 0810) BP: 114/63 (03/31 0810) Pulse Rate: 66 (03/31 0810)  Labs: Recent Labs    01/13/20 0949 01/13/20 1137 01/13/20 1946 01/13/20 2139 01/14/20 0724  HGB 11.8*  --   --   --  10.7*  HCT 37.8  --   --   --  33.0*  PLT 167  --   --   --  167  APTT  --  78*  --   --   --   LABPROT  --  12.8  --   --   --   INR  --  1.0  --   --   --   HEPARINUNFRC  --   --  1.48* 1.37* 1.56*  CREATININE 0.86  --   --   --  0.81  TROPONINIHS 5 6  --   --   --     Estimated Creatinine Clearance: 64.7 mL/min (by C-G formula based on SCr of 0.81 mg/dL).   Medical History: Past Medical History:  Diagnosis Date  . Arthritis   . Cancer ([place])    rectal  . GERD (gastroesophageal reflux disease)   . Hypercholesteremia   . Hypertension   . Hypertension   . Lower extremity edema   . Urinary incontinence      Assessment: Pharmacy consulted for heparin infusion dosing and monitoring in 74 yo female sent to ED from [facility] for abnormal labs and possible PE.   CT Chest: Nonocclusive acute pulmonary embolus in the right main pulmonary artery extending out into the interlobar pulmonary artery. Tiny string like pulmonary embolus identified in subsegmental arteries to the right lower lobe.   Plan:  -Discontinue heparin drip -Start apixaban 10 mg BID x 7 days followed by apixaban 5 mg BID for treatment duration -CBC per protocol   [place] Resident 01/14/2020 10:02 AM

## 2020-01-14 NOTE — Discharge Instructions (Signed)
Pulmonary Embolism  A pulmonary embolism (PE) is a sudden blockage or decrease of blood flow in one or both lungs. Most blockages come from a blood clot that forms in the vein of a lower leg, thigh, or arm (deep vein thrombosis, DVT) and travels to the lungs. A clot is blood that has thickened into a gel or solid. PE is a dangerous and life-threatening condition that needs to be treated right away. What are the causes? This condition is usually caused by a blood clot that forms in a vein and moves to the lungs. In rare cases, it may be caused by air, fat, part of a tumor, or other tissue that moves through the veins and into the lungs. What increases the risk? The following factors may make you more likely to develop this condition:  Experiencing a traumatic injury, such as breaking a hip or leg.  Having: ? A spinal cord injury. ? Orthopedic surgery, especially hip or knee replacement. ? Any major surgery. ? A stroke. ? DVT. ? Blood clots or blood clotting disease. ? Long-term (chronic) lung or heart disease. ? Cancer treated with chemotherapy. ? A central venous catheter.  Taking medicines that contain estrogen. These include birth control pills and hormone replacement therapy.  Being: ? Pregnant. ? In the period of time after your baby is delivered (postpartum). ? Older than age 60. ? Overweight. ? A smoker, especially if you have other risks. What are the signs or symptoms? Symptoms of this condition usually start suddenly and include:  Shortness of breath during activity or at rest.  Coughing, coughing up blood, or coughing up blood-tinged mucus.  Chest pain that is often worse with deep breaths.  Rapid or irregular heartbeat.  Feeling light-headed or dizzy.  Fainting.  Feeling anxious.  Fever.  Sweating.  Pain and swelling in a leg. This is a symptom of DVT, which can lead to PE. How is this diagnosed? This condition may be diagnosed based on:  Your medical  history.  A physical exam.  Blood tests.  CT pulmonary angiogram. This test checks blood flow in and around your lungs.  Ventilation-perfusion scan, also called a lung VQ scan. This test measures air flow and blood flow to the lungs.  An ultrasound of the legs. How is this treated? Treatment for this condition depends on many factors, such as the cause of your PE, your risk for bleeding or developing more clots, and other medical conditions you have. Treatment aims to remove, dissolve, or stop blood clots from forming or growing larger. Treatment may include:  Medicines, such as: ? Blood thinning medicines (anticoagulants) to stop clots from forming. ? Medicines that dissolve clots (thrombolytics).  Procedures, such as: ? Using a flexible tube to remove a blood clot (embolectomy) or to deliver medicine to destroy it (catheter-directed thrombolysis). ? Inserting a filter into a large vein that carries blood to the heart (inferior vena cava). This filter (vena cava filter) catches blood clots before they reach the lungs. ? Surgery to remove the clot (surgical embolectomy). This is rare. You may need a combination of immediate, long-term (up to 3 months after diagnosis), and extended (more than 3 months after diagnosis) treatments. Your treatment may continue for several months (maintenance therapy). You and your health care provider will work together to choose the treatment program that is best for you. Follow these instructions at home: Medicines  Take over-the-counter and prescription medicines only as told by your health care provider.  If you   are taking an anticoagulant medicine: ? Take the medicine every day at the same time each day. ? Understand what foods and drugs interact with your medicine. ? Understand the side effects of this medicine, including excessive bruising or bleeding. Ask your health care provider or pharmacist about other side effects. General  instructions  Wear a medical alert bracelet or carry a medical alert card that says you have had a PE and lists what medicines you take.  Ask your health care provider when you may return to your normal activities. Avoid sitting or lying for a long time without moving.  Maintain a healthy weight. Ask your health care provider what weight is healthy for you.  Do not use any products that contain nicotine or tobacco, such as cigarettes, e-cigarettes, and chewing tobacco. If you need help quitting, ask your health care provider.  Talk with your health care provider about any travel plans. It is important to make sure that you are still able to take your medicine while on trips.  Keep all follow-up visits as told by your health care provider. This is important. Contact a health care provider if:  You missed a dose of your blood thinner medicine. Get help right away if:  You have: ? New or increased pain, swelling, warmth, or redness in an arm or leg. ? Numbness or tingling in an arm or leg. ? Shortness of breath during activity or at rest. ? A fever. ? Chest pain. ? A rapid or irregular heartbeat. ? A severe headache. ? Vision changes. ? A serious fall or accident, or you hit your head. ? Stomach (abdominal) pain. ? Blood in your vomit, stool, or urine. ? A cut that will not stop bleeding.  You cough up blood.  You feel light-headed or dizzy.  You cannot move your arms or legs.  You are confused or have memory loss. These symptoms may represent a serious problem that is an emergency. Do not wait to see if the symptoms will go away. Get medical help right away. Call your local emergency services (911 in the [place] Do not drive yourself to the hospital. Summary  A pulmonary embolism (PE) is a sudden blockage or decrease of blood flow in one or both lungs. PE is a dangerous and life-threatening condition that needs to be treated right away.  Treatments for this condition usually  include medicines to thin your blood (anticoagulants) or medicines to break apart blood clots (thrombolytics).  If you are given blood thinners, it is important to take the medicine every day at the same time each day.  Understand what foods and drugs interact with any medicines that you are taking.  If you have signs of PE or DVT, call your local emergency services (911 in the [place] This information is not intended to replace advice given to you by your health care provider. Make sure you discuss any questions you have with your health care provider. Document Revised: 07/10/2018 Document Reviewed: 07/10/2018 [employer] Patient Education  2020 [employer]  

## 2020-01-14 NOTE — Discharge Summary (Signed)
Physician Discharge Summary  Jacqueline Yoder [id number] DOB: [birth year] DOA: 01/13/2020  PCP: Kirk Ruths, MD  Admit date: 01/13/2020 Discharge date: 01/14/2020  Discharge disposition: Home   Recommendations for Outpatient Follow-Up:   Follow-up with PCP in 1 week Follow-up with oncologist as scheduled  Discharge Diagnosis:   Principal Problem:   Acute pulmonary embolism ([place]) Active Problems:   Hyperlipidemia   Rectal cancer ([place])   Hypertension   GERD (gastroesophageal reflux disease)   DVT (deep venous thrombosis) ([place])    Discharge Condition: Stable.  Diet recommendation: Heart healthy diet  Code status: Full code.    Hospital Course:   [name] is a 74 y.o. female with medical history significant of rectal cancer (s/p of surgery, colostomy and chemotherapy), hypertension, hyperlipidemia, GERD, urinary incontinence, COVID-19 infection 09/2019, who was referred from the cancer center through emergency room because of abnormal CT chest which showed pulmonary embolism.  She did not have any symptoms.  She was admitted to the hospital for observation.  She was treated with IV heparin infusion.  Venous duplex of the lower extremities showed bilateral DVT.  She was subsequently transitioned to Eliquis at discharge.  Risks, benefits and alternatives of longterm anticoagulation were discussed and patient opted for Eliquis.  She is deemed medically stable for discharge to home today.  Discharge plan was also discussed with her daughter, Jacqueline Yoder.    Discharge Exam:   Vitals:   01/14/20 0810 01/14/20 1155  BP: 114/63 (!) 110/59  Pulse: 66 63  Resp: 16 16  Temp: 98.7 F (37.1 C) 98.3 F (36.8 C)  SpO2: 95% 98%   Vitals:   01/13/20 1958 01/14/20 0520 01/14/20 0810 01/14/20 1155  BP: (!) 117/59 105/63 114/63 (!) 110/59  Pulse: 73 74 66 63  Resp: 16 15 16 16   Temp: 98.4 F (36.9 C) 98.7 F (37.1 C) 98.7 F (37.1 C) 98.3 F (36.8 C)    TempSrc: Oral Oral Oral Oral  SpO2: 97% 100% 95% 98%  Weight:  77.7 kg    Height:         GEN: NAD SKIN: No rash EYES: EOMI ENT: MMM CV: RRR PULM: CTA B ABD: soft, ND, NT, +BS, +colostomy CNS: AAO x 3, non focal EXT: No edema or tenderness   The results of significant diagnostics from this hospitalization (including imaging, microbiology, ancillary and laboratory) are listed below for reference.     Procedures and Diagnostic Studies:   CT Chest W Contrast  Result Date: 01/13/2020 CLINICAL DATA:  Rectal cancer. Restaging. EXAM: CT CHEST, ABDOMEN, AND PELVIS WITH CONTRAST TECHNIQUE: Multidetector CT imaging of the chest, abdomen and pelvis was performed following the standard protocol during bolus administration of intravenous contrast. CONTRAST:  63mL OMNIPAQUE IOHEXOL 300 MG/ML  SOLN COMPARISON:  11/19/2018 FINDINGS: CT CHEST FINDINGS Cardiovascular: The heart size is normal. No substantial pericardial effusion. Coronary artery calcification is evident. Atherosclerotic calcification is noted in the wall of the thoracic aorta. Right Port-A-Cath tip is positioned at the SVC/RA junction. Nonocclusive acute pulmonary embolus identified in the right main pulmonary artery extending out into the interlobar pulmonary artery. There is a tiny string like pulmonary embolus in subsegmental arteries to the right lower lobe (image 36/series 2). Mediastinum/Nodes: No mediastinal lymphadenopathy. There is no hilar lymphadenopathy. The esophagus has normal imaging features. There is no axillary lymphadenopathy. Lungs/Pleura: No suspicious pulmonary nodule or mass. No consolidation. No effusion. Minimal atelectasis noted posterior right base. Musculoskeletal: No worrisome lytic or sclerotic  osseous abnormality. CT ABDOMEN PELVIS FINDINGS Hepatobiliary: No suspicious focal abnormality within the liver parenchyma. Gallbladder surgically absent. No intrahepatic or extrahepatic biliary dilation. Pancreas: No  focal mass lesion. No dilatation of the main duct. No intraparenchymal cyst. No peripancreatic edema. Spleen: No splenomegaly. No focal mass lesion. Adrenals/Urinary Tract: No adrenal nodule or mass. Bilateral renal cysts again noted. There are tiny hypodensities in both kidneys that are too small to characterize, similar to prior. No evidence for hydroureter. The urinary bladder appears normal for the degree of distention. Stomach/Bowel: Stomach is unremarkable. No gastric wall thickening. No evidence of outlet obstruction. Duodenum is normally positioned as is the ligament of Treitz. No small bowel wall thickening. No small bowel dilatation. The appendix is normal. Sigmoid end colostomy noted. Vascular/Lymphatic: There is abdominal aortic atherosclerosis without aneurysm. There is no gastrohepatic or hepatoduodenal ligament lymphadenopathy. No retroperitoneal or mesenteric lymphadenopathy. No pelvic sidewall lymphadenopathy. Laminar flow of un opacified blood identified in both common femoral veins. Reproductive: Uterus surgically absent. There is no adnexal mass. Skin thickening in the region the vaginal introitus may be related to prior surgery/radiation. Other: No intraperitoneal free fluid. There is some trace presacral soft tissue likely treatment related. Musculoskeletal: No worrisome lytic or sclerotic osseous abnormality. IMPRESSION: 1. No definite evidence for metastatic disease in the chest, abdomen, or pelvis. This is the patient's first postsurgical study in our system and will now establish baseline for follow-up imaging. 2. Nonocclusive acute pulmonary embolus in the right main pulmonary artery extending out into the interlobar pulmonary artery. Tiny string like pulmonary embolus identified in subsegmental arteries to the right lower lobe. 3. Trace presacral soft tissue density is probably treatment related. Attention on follow-up recommended. 4. Aortic Atherosclerosis (ICD10-I70.0). Critical  Value/emergent results were called by telephone at the time of interpretation on 01/13/2020 at 9:13 am to provider [facility] , who verbally acknowledged these results. After this conversation, I discussed the findings of today's study with the patient and her daughter. The patient was in no acute distress and was not short of breath. They will proceed [facility] emergency department after leaving the imaging center. We will contact triage at the emergency department and alert them to the patient's arrival. Electronically Signed   By: Misty Stanley M.D.   On: 01/13/2020 09:14   CT Abdomen Pelvis W Contrast  Result Date: 01/13/2020 CLINICAL DATA:  Rectal cancer. Restaging. EXAM: CT CHEST, ABDOMEN, AND PELVIS WITH CONTRAST TECHNIQUE: Multidetector CT imaging of the chest, abdomen and pelvis was performed following the standard protocol during bolus administration of intravenous contrast. CONTRAST:  80mL OMNIPAQUE IOHEXOL 300 MG/ML  SOLN COMPARISON:  11/19/2018 FINDINGS: CT CHEST FINDINGS Cardiovascular: The heart size is normal. No substantial pericardial effusion. Coronary artery calcification is evident. Atherosclerotic calcification is noted in the wall of the thoracic aorta. Right Port-A-Cath tip is positioned at the SVC/RA junction. Nonocclusive acute pulmonary embolus identified in the right main pulmonary artery extending out into the interlobar pulmonary artery. There is a tiny string like pulmonary embolus in subsegmental arteries to the right lower lobe (image 36/series 2). Mediastinum/Nodes: No mediastinal lymphadenopathy. There is no hilar lymphadenopathy. The esophagus has normal imaging features. There is no axillary lymphadenopathy. Lungs/Pleura: No suspicious pulmonary nodule or mass. No consolidation. No effusion. Minimal atelectasis noted posterior right base. Musculoskeletal: No worrisome lytic or sclerotic osseous abnormality. CT ABDOMEN PELVIS FINDINGS  Hepatobiliary: No suspicious focal abnormality within the liver parenchyma. Gallbladder surgically absent. No intrahepatic or extrahepatic biliary  dilation. Pancreas: No focal mass lesion. No dilatation of the main duct. No intraparenchymal cyst. No peripancreatic edema. Spleen: No splenomegaly. No focal mass lesion. Adrenals/Urinary Tract: No adrenal nodule or mass. Bilateral renal cysts again noted. There are tiny hypodensities in both kidneys that are too small to characterize, similar to prior. No evidence for hydroureter. The urinary bladder appears normal for the degree of distention. Stomach/Bowel: Stomach is unremarkable. No gastric wall thickening. No evidence of outlet obstruction. Duodenum is normally positioned as is the ligament of Treitz. No small bowel wall thickening. No small bowel dilatation. The appendix is normal. Sigmoid end colostomy noted. Vascular/Lymphatic: There is abdominal aortic atherosclerosis without aneurysm. There is no gastrohepatic or hepatoduodenal ligament lymphadenopathy. No retroperitoneal or mesenteric lymphadenopathy. No pelvic sidewall lymphadenopathy. Laminar flow of un opacified blood identified in both common femoral veins. Reproductive: Uterus surgically absent. There is no adnexal mass. Skin thickening in the region the vaginal introitus may be related to prior surgery/radiation. Other: No intraperitoneal free fluid. There is some trace presacral soft tissue likely treatment related. Musculoskeletal: No worrisome lytic or sclerotic osseous abnormality. IMPRESSION: 1. No definite evidence for metastatic disease in the chest, abdomen, or pelvis. This is the patient's first postsurgical study in our system and will now establish baseline for follow-up imaging. 2. Nonocclusive acute pulmonary embolus in the right main pulmonary artery extending out into the interlobar pulmonary artery. Tiny string like pulmonary embolus identified in subsegmental arteries to the right lower  lobe. 3. Trace presacral soft tissue density is probably treatment related. Attention on follow-up recommended. 4. Aortic Atherosclerosis (ICD10-I70.0). Critical Value/emergent results were called by telephone at the time of interpretation on 01/13/2020 at 9:13 am to provider [facility] , who verbally acknowledged these results. After this conversation, I discussed the findings of today's study with the patient and her daughter. The patient was in no acute distress and was not short of breath. They will proceed directly to the [facility] hospital emergency department after leaving the imaging center. We will contact triage at the emergency department and alert them to the patient's arrival. Electronically Signed   By: Misty Stanley M.D.   On: 01/13/2020 09:14   US Venous Img Lower Bilateral (DVT)  Result Date: 01/13/2020 CLINICAL DATA:  Acute pulmonary embolus. EXAM: BILATERAL LOWER EXTREMITY VENOUS DOPPLER ULTRASOUND TECHNIQUE: Gray-scale sonography with compression, as well as color and duplex ultrasound, were performed to evaluate the deep venous system(s) from the level of the common femoral vein through the popliteal and proximal calf veins. COMPARISON:  Chest CT earlier today, no prior lower extremity duplex. FINDINGS: VENOUS Right lower extremity: Normal compressibility of the common femoral, superficial femoral, and popliteal veins, as well as the visualized calf veins. Doppler waveforms show normal direction of venous flow, normal respiratory phasicity and response to augmentation. There is nonocclusive thrombus in the profundus femoral vein. No other filling defects to suggest DVT on grayscale or color Doppler imaging. Left lower extremity: There is nonocclusive thrombus throughout the left lower extremity veins including the common femoral, profundus femoral, femoral, popliteal, and posterior tibial veins. Vessels are partially compressible. No thrombus in the peroneal vein. OTHER None.  Limitations: none IMPRESSION: 1. Positive for DVT throughout the left lower extremity veins with nonocclusive thrombus extending from the common femoral through the posterior tibial veins. 2. Nonocclusive thrombus in the right profundus femoral vein. No other DVT in the right lower extremity. These results will be called to the ordering clinician or representative by the Radiologist  Environmental consultant, and communication documented in the PACS or [employer]. Electronically Signed   By: Keith Rake M.D.   On: 01/13/2020 17:28   ECHOCARDIOGRAM COMPLETE  Result Date: 01/14/2020    ECHOCARDIOGRAM REPORT   Patient Name:   Jacqueline Yoder Date of Exam: 01/13/2020 Medical Rec #:  [id number]      Height:       65.0 in Accession #:    [id number]     Weight:       172.0 lb Date of Birth:  1946-09-11      BSA:          1.855 m Patient Age:    18 years       BP:           129/68 mmHg Patient Gender: F              HR:           79 bpm. Exam Location:  [facility] Procedure: 2D Echo, Cardiac Doppler and Color Doppler Indications:     I26.99 Pulmonary Embolus  History:         Patient has no prior history of Echocardiogram examinations.                  Risk Factors:Hypertension and Dyslipidemia. Lower extremity                  edema.  Sonographer:     Wilford Sports Rodgers-Jones Referring Phys:  Unknown Foley NIU Diagnosing Phys: Nelva Bush MD IMPRESSIONS  1. Left ventricular ejection fraction, by estimation, is 60 to 65%. The left ventricle has normal function. The left ventricle has no regional wall motion abnormalities. There is mild left ventricular hypertrophy. Left ventricular diastolic parameters are consistent with Grade I diastolic dysfunction (impaired relaxation).  2. Right ventricular systolic function is normal. The right ventricular size is normal. Tricuspid regurgitation signal is inadequate for assessing PA pressure.  3. The mitral valve is degenerative. Trivial mitral valve regurgitation. No evidence of mitral  stenosis.  4. The aortic valve is tricuspid. Aortic valve regurgitation is not visualized. No aortic stenosis is present. FINDINGS  Left Ventricle: Left ventricular ejection fraction, by estimation, is 60 to 65%. The left ventricle has normal function. The left ventricle has no regional wall motion abnormalities. The left ventricular internal cavity size was normal in size. There is  mild left ventricular hypertrophy. Left ventricular diastolic parameters are consistent with Grade I diastolic dysfunction (impaired relaxation). Right Ventricle: The right ventricular size is normal. No increase in right ventricular wall thickness. Right ventricular systolic function is normal. Tricuspid regurgitation signal is inadequate for assessing PA pressure. Left Atrium: Left atrial size was normal in size. Right Atrium: Right atrial size was normal in size. Pericardium: There is no evidence of pericardial effusion. Mitral Valve: The mitral valve is degenerative in appearance. There is mild thickening of the mitral valve leaflet(s). Mild mitral annular calcification. Trivial mitral valve regurgitation. No evidence of mitral valve stenosis. Tricuspid Valve: The tricuspid valve is normal in structure. Tricuspid valve regurgitation is trivial. Aortic Valve: The aortic valve is tricuspid. Aortic valve regurgitation is not visualized. No aortic stenosis is present. Pulmonic Valve: The pulmonic valve was not well visualized. Pulmonic valve regurgitation is not visualized. No evidence of pulmonic stenosis. Aorta: The aortic root is normal in size and structure. Pulmonary Artery: The pulmonary artery is of normal size. Venous: The inferior vena cava was not well visualized. IAS/Shunts: The interatrial septum was  not well visualized. Additional Comments: A venous catheter is visualized in the right atrium.  LEFT VENTRICLE PLAX 2D LVIDd:         4.10 cm Diastology LVIDs:         2.67 cm LV e' lateral:   8.05 cm/s LV PW:         1.11 cm LV  E/e' lateral: 10.7 LV IVS:        1.15 cm LV e' medial:    5.87 cm/s                        LV E/e' medial:  14.6  RIGHT VENTRICLE RV Basal diam:  4.14 cm RV S prime:     17.20 cm/s TAPSE (M-mode): 2.0 cm LEFT ATRIUM             Index       RIGHT ATRIUM           Index LA diam:        4.40 cm 2.37 cm/m  RA Area:     13.40 cm LA Vol (A2C):   33.0 ml 17.79 ml/m RA Volume:   36.20 ml  19.51 ml/m LA Vol (A4C):   47.1 ml 25.39 ml/m LA Biplane Vol: 42.2 ml 22.75 ml/m   AORTA Ao Root diam: 2.90 cm MITRAL VALVE MV Area (PHT): 2.62 cm MV Decel Time: 289 msec MV E velocity: 85.90 cm/s MV A velocity: 109.00 cm/s MV E/A ratio:  0.79 Christopher End MD Electronically signed by Nelva Bush MD Signature Date/Time: 01/14/2020/6:41:59 AM    Final      Labs:   Basic Metabolic Panel: Recent Labs  Lab 01/13/20 0949 01/14/20 0724  NA 139 141  K 3.6 3.5  CL 100 105  CO2 28 28  GLUCOSE 86 102*  BUN 11 11  CREATININE 0.86 0.81  CALCIUM 9.7 9.2   GFR Estimated Creatinine Clearance: 64.7 mL/min (by C-G formula based on SCr of 0.81 mg/dL). Liver Function Tests: Recent Labs  Lab 01/13/20 0949  AST 28  ALT 13  ALKPHOS 41  BILITOT 0.6  PROT 7.4  ALBUMIN 4.3   No results for input(s): LIPASE, AMYLASE in the last 168 hours. No results for input(s): AMMONIA in the last 168 hours. Coagulation profile Recent Labs  Lab 01/13/20 1137  INR 1.0    CBC: Recent Labs  Lab 01/13/20 0949 01/14/20 0724  WBC 3.7* 4.7  NEUTROABS 2.3  --   HGB 11.8* 10.7*  HCT 37.8 33.0*  MCV 92.9 90.2  PLT 167 167   Cardiac Enzymes: No results for input(s): CKTOTAL, CKMB, CKMBINDEX, TROPONINI in the last 168 hours. BNP: Invalid input(s): POCBNP CBG: No results for input(s): GLUCAP in the last 168 hours. D-Dimer No results for input(s): DDIMER in the last 72 hours. Hgb A1c No results for input(s): HGBA1C in the last 72 hours. Lipid Profile No results for input(s): CHOL, HDL, LDLCALC, TRIG, CHOLHDL,  LDLDIRECT in the last 72 hours. Thyroid function studies No results for input(s): TSH, T4TOTAL, T3FREE, THYROIDAB in the last 72 hours.  Invalid input(s): FREET3 Anemia work up No results for input(s): VITAMINB12, FOLATE, FERRITIN, TIBC, IRON, RETICCTPCT in the last 72 hours. Microbiology Recent Results (from the past 240 hour(s))  Respiratory Panel by RT PCR (Flu A&B, Covid) - Nasopharyngeal Swab     Status: None   Collection Time: 01/13/20  9:49 AM   Specimen: Nasopharyngeal Swab  Result Value Ref Range Status  SARS Coronavirus 2 by RT PCR NEGATIVE NEGATIVE Final    Comment: (NOTE) SARS-CoV-2 target nucleic acids are NOT DETECTED. The SARS-CoV-2 RNA is generally detectable in upper respiratoy specimens during the acute phase of infection. The lowest concentration of SARS-CoV-2 viral copies this assay can detect is 131 copies/mL. A negative result does not preclude SARS-Cov-2 infection and should not be used as the sole basis for treatment or other patient management decisions. A negative result may occur with  improper specimen collection/handling, submission of specimen other than nasopharyngeal swab, presence of viral mutation(s) within the areas targeted by this assay, and inadequate number of viral copies (<131 copies/mL). A negative result must be combined with clinical observations, patient history, and epidemiological information. The expected result is Negative. Fact Sheet for Patients:  PinkCheek.be Fact Sheet for Healthcare Providers:  GravelBags.it This test is not yet ap proved or cleared by the [place] FDA and  has been authorized for detection and/or diagnosis of SARS-CoV-2 by FDA under an Emergency Use Authorization (EUA). This EUA will remain  in effect (meaning this test can be used) for the duration of the COVID-19 declaration under Section 564(b)(1) of the Act, 21 U.S.C. section 360bbb-3(b)(1),  unless the authorization is terminated or revoked sooner.    Influenza A by PCR NEGATIVE NEGATIVE Final   Influenza B by PCR NEGATIVE NEGATIVE Final    Comment: (NOTE) The Xpert Xpress SARS-CoV-2/FLU/RSV assay is intended as an aid in  the diagnosis of influenza from Nasopharyngeal swab specimens and  should not be used as a sole basis for treatment. Nasal washings and  aspirates are unacceptable for Xpert Xpress SARS-CoV-2/FLU/RSV  testing. Fact Sheet for Patients: PinkCheek.be Fact Sheet for Healthcare Providers: GravelBags.it This test is not yet approved or cleared by the [place] FDA and  has been authorized for detection and/or diagnosis of SARS-CoV-2 by  FDA under an Emergency Use Authorization (EUA). This EUA will remain  in effect (meaning this test can be used) for the duration of the  Covid-19 declaration under Section 564(b)(1) of the Act, 21  U.S.C. section 360bbb-3(b)(1), unless the authorization is  terminated or revoked. Performed at [facility], [address] [place], [place] [address]      Discharge Instructions:   Discharge Instructions    Diet - low sodium heart healthy   Complete by: As directed    Increase activity slowly   Complete by: As directed      Allergies as of 01/14/2020      Reactions   Sulfamethoxazole-trimethoprim Other (See Comments)   Also said she had chills and pressure in nose.      Medication List    STOP taking these medications   nystatin powder Commonly known as: MYCOSTATIN/NYSTOP     TAKE these medications   apixaban 5 MG Tabs tablet Commonly known as: ELIQUIS Take 2 tablets (10 mg total) by mouth 2 (two) times daily for 7 days.   apixaban 5 MG Tabs tablet Commonly known as: ELIQUIS Take 1 tablet (5 mg total) by mouth 2 (two) times daily. Start taking on: January 21, 2020   Bilberry Plus Lutein 02-999 MCG-MG Caps Take 1 capsule by mouth  daily.   chlorhexidine 0.12 % solution Commonly known as: Peridex Use as directed 15 mLs in the mouth or throat 2 (two) times daily.   Cinnamon Bark Powd Take by mouth.   diclofenac sodium 1 % Gel Commonly known as: VOLTAREN Apply 2 g topically 4 (four) times daily  as needed (joint pain).   docusate sodium 100 MG capsule Commonly known as: COLACE Take 100 mg by mouth 2 (two) times daily.   fexofenadine 180 MG tablet Commonly known as: ALLEGRA Take 180 mg by mouth daily.   FIBER ADULT GUMMIES PO Take 1 Dose by mouth daily.   fluticasone 50 MCG/ACT nasal spray Commonly known as: FLONASE Place 1 spray into both nostrils 2 (two) times a day.   gabapentin 100 MG capsule Commonly known as: Neurontin Take 1 capsule (100 mg total) by mouth at bedtime.   HYDROcodone-acetaminophen 5-325 MG tablet Commonly known as: NORCO/VICODIN Take 1 tablet by mouth every 4 (four) hours as needed for moderate pain.   ipratropium 0.03 % nasal spray Commonly known as: ATROVENT   ketorolac 0.4 % Soln Commonly known as: ACULAR Place 1 drop into both eyes daily.   lidocaine-prilocaine cream Commonly known as: EMLA Apply 1 application topically as needed.   loperamide 2 MG capsule Commonly known as: IMODIUM Take 1 capsule (2 mg total) by mouth See admin instructions. With onset of loose stool, take 4mg  followed by 2mg  every 2 hours,  Maximum: 16 mg/day   Lutein 40 MG Caps Take 40 mg by mouth daily.   magic mouthwash w/lidocaine Soln Take 5 mLs by mouth 4 (four) times daily as needed for mouth pain. Sig: Swish/Swallow 5-10 ml four times a day as needed   ondansetron 8 MG tablet Commonly known as: ZOFRAN Take 1 tablet (8 mg total) by mouth every 8 (eight) hours as needed for nausea, vomiting or refractory nausea / vomiting.   ONE-A-DAY WOMENS 50 PLUS PO Take 1 tablet by mouth daily.   pantoprazole 40 MG tablet Commonly known as: PROTONIX Take 1 tablet (40 mg total) by mouth as  needed. What changed:   when to take this  reasons to take this   potassium chloride SA 20 MEQ tablet Commonly known as: KLOR-CON Take 20 mEq by mouth daily.   prednisoLONE acetate 1 % ophthalmic suspension Commonly known as: PRED FORTE Place 1 drop into both eyes 2 (two) times a day.   prochlorperazine 10 MG tablet Commonly known as: COMPAZINE Take 1 tablet (10 mg total) by mouth every 6 (six) hours as needed (Nausea or vomiting).   simvastatin 40 MG tablet Commonly known as: ZOCOR Take 40 mg by mouth at bedtime.   triamterene-hydrochlorothiazide 37.5-25 MG capsule Commonly known as: DYAZIDE Take 1 capsule by mouth daily.   Vitamin D3 50 MCG (2000 UT) capsule Take 2,000 Units by mouth daily.         Time coordinating discharge: 32 minutes  Signed:  Hae Ahlers  [facility] 01/14/2020, 12:50 PM

## 2020-01-15 ENCOUNTER — Telehealth: Payer: Self-pay

## 2020-01-15 NOTE — Telephone Encounter (Signed)
-----   Message from Earlie Server, MD sent at 01/14/2020  9:58 PM EDT ----- She was admitted due to PE.  Please arrange her to do a post hospitalization follow up next week. Lab md  15 min

## 2020-01-15 NOTE — Telephone Encounter (Signed)
Done.  Pt MD post hospitalization follow up for lab/MD has been scheduled as requested. I'll contact pt to make her aware of her scheduled appt date and time.

## 2020-01-15 NOTE — Telephone Encounter (Signed)
Please schedule as requested. Thank you.

## 2020-01-20 ENCOUNTER — Encounter: Payer: Self-pay | Admitting: Oncology

## 2020-01-20 NOTE — Progress Notes (Signed)
Pt contacted for follow up post hospitalization. No new concerns voiced.

## 2020-01-21 ENCOUNTER — Inpatient Hospital Stay: Payer: Medicare Other | Attending: Oncology

## 2020-01-21 ENCOUNTER — Encounter: Payer: Self-pay | Admitting: Oncology

## 2020-01-21 ENCOUNTER — Other Ambulatory Visit: Payer: Self-pay

## 2020-01-21 ENCOUNTER — Inpatient Hospital Stay (HOSPITAL_BASED_OUTPATIENT_CLINIC_OR_DEPARTMENT_OTHER): Payer: Medicare Other | Admitting: Oncology

## 2020-01-21 VITALS — BP 133/75 | HR 73 | Temp 96.7°F | Resp 16 | Wt 171.7 lb

## 2020-01-21 DIAGNOSIS — G629 Polyneuropathy, unspecified: Secondary | ICD-10-CM | POA: Diagnosis not present

## 2020-01-21 DIAGNOSIS — D649 Anemia, unspecified: Secondary | ICD-10-CM | POA: Diagnosis not present

## 2020-01-21 DIAGNOSIS — Z800 Family history of malignant neoplasm of digestive organs: Secondary | ICD-10-CM | POA: Insufficient documentation

## 2020-01-21 DIAGNOSIS — Z87891 Personal history of nicotine dependence: Secondary | ICD-10-CM | POA: Diagnosis not present

## 2020-01-21 DIAGNOSIS — I82413 Acute embolism and thrombosis of femoral vein, bilateral: Secondary | ICD-10-CM

## 2020-01-21 DIAGNOSIS — E7800 Pure hypercholesterolemia, unspecified: Secondary | ICD-10-CM | POA: Diagnosis not present

## 2020-01-21 DIAGNOSIS — Z86718 Personal history of other venous thrombosis and embolism: Secondary | ICD-10-CM | POA: Diagnosis not present

## 2020-01-21 DIAGNOSIS — Z79899 Other long term (current) drug therapy: Secondary | ICD-10-CM | POA: Diagnosis not present

## 2020-01-21 DIAGNOSIS — Z8042 Family history of malignant neoplasm of prostate: Secondary | ICD-10-CM | POA: Diagnosis not present

## 2020-01-21 DIAGNOSIS — I700 Atherosclerosis of aorta: Secondary | ICD-10-CM | POA: Diagnosis not present

## 2020-01-21 DIAGNOSIS — M129 Arthropathy, unspecified: Secondary | ICD-10-CM | POA: Diagnosis not present

## 2020-01-21 DIAGNOSIS — R609 Edema, unspecified: Secondary | ICD-10-CM | POA: Insufficient documentation

## 2020-01-21 DIAGNOSIS — Z86010 Personal history of colonic polyps: Secondary | ICD-10-CM | POA: Insufficient documentation

## 2020-01-21 DIAGNOSIS — Z933 Colostomy status: Secondary | ICD-10-CM | POA: Diagnosis not present

## 2020-01-21 DIAGNOSIS — R630 Anorexia: Secondary | ICD-10-CM | POA: Diagnosis not present

## 2020-01-21 DIAGNOSIS — K219 Gastro-esophageal reflux disease without esophagitis: Secondary | ICD-10-CM | POA: Diagnosis not present

## 2020-01-21 DIAGNOSIS — I10 Essential (primary) hypertension: Secondary | ICD-10-CM | POA: Diagnosis not present

## 2020-01-21 DIAGNOSIS — K5900 Constipation, unspecified: Secondary | ICD-10-CM | POA: Insufficient documentation

## 2020-01-21 DIAGNOSIS — C20 Malignant neoplasm of rectum: Secondary | ICD-10-CM

## 2020-01-21 DIAGNOSIS — N281 Cyst of kidney, acquired: Secondary | ICD-10-CM | POA: Diagnosis not present

## 2020-01-21 DIAGNOSIS — Z7901 Long term (current) use of anticoagulants: Secondary | ICD-10-CM | POA: Diagnosis not present

## 2020-01-21 DIAGNOSIS — I2699 Other pulmonary embolism without acute cor pulmonale: Secondary | ICD-10-CM | POA: Insufficient documentation

## 2020-01-21 LAB — CBC WITH DIFFERENTIAL/PLATELET
Abs Immature Granulocytes: 0.01 10*3/uL (ref 0.00–0.07)
Basophils Absolute: 0 10*3/uL (ref 0.0–0.1)
Basophils Relative: 0 %
Eosinophils Absolute: 0.1 10*3/uL (ref 0.0–0.5)
Eosinophils Relative: 2 %
HCT: 37.3 % (ref 36.0–46.0)
Hemoglobin: 11.7 g/dL — ABNORMAL LOW (ref 12.0–15.0)
Immature Granulocytes: 0 %
Lymphocytes Relative: 42 %
Lymphs Abs: 1.2 10*3/uL (ref 0.7–4.0)
MCH: 29.1 pg (ref 26.0–34.0)
MCHC: 31.4 g/dL (ref 30.0–36.0)
MCV: 92.8 fL (ref 80.0–100.0)
Monocytes Absolute: 0.3 10*3/uL (ref 0.1–1.0)
Monocytes Relative: 11 %
Neutro Abs: 1.3 10*3/uL — ABNORMAL LOW (ref 1.7–7.7)
Neutrophils Relative %: 45 %
Platelets: 247 10*3/uL (ref 150–400)
RBC: 4.02 MIL/uL (ref 3.87–5.11)
RDW: 15.3 % (ref 11.5–15.5)
WBC: 2.9 10*3/uL — ABNORMAL LOW (ref 4.0–10.5)
nRBC: 0 % (ref 0.0–0.2)

## 2020-01-21 LAB — IRON AND TIBC
Iron: 59 ug/dL (ref 28–170)
Saturation Ratios: 21 % (ref 10.4–31.8)
TIBC: 286 ug/dL (ref 250–450)
UIBC: 227 ug/dL

## 2020-01-21 LAB — FERRITIN: Ferritin: 436 ng/mL — ABNORMAL HIGH (ref 11–307)

## 2020-01-21 NOTE — Progress Notes (Signed)
Hematology/Oncology progress note [facility] Telephone:([phone] [phone]   Patient Care Team: Kirk Ruths, MD as PCP - General (Internal Medicine) Clent Jacks, RN as Registered Nurse  REFERRING PROVIDER: Dr.Secord CHIEF COMPLAINTS/REASON FOR VISIT:  Follow up for rectal cancer  HISTORY OF PRESENTING ILLNESS:  Jacqueline Yoder is a  74 y.o.  female with PMH listed below who was referred to me for evaluation of endometrial cancer.   Patient initially presented with complaints of postmenopausal bleeding on 08/16/2018.  History of was menopausal vaginal bleeding in 2016 which resulted in cervical polypectomy.  Pathology 02/04/2015 showed cervical polyp, consistent with benign endometrial polyp.  [name] after polypectomy due to anxiety associated with pelvic exams.  pelvic exam on 08/16/2018 reviewed cervical abnormality and from enlarged uterus. Seen by Dr. Marcelline Mates on 10/29/2018.  Endometrial biopsy and a Pap smear was performed. 10/29/2018 Pap smear showed adenocarcinoma, favor endometrial origin. 10/29/2018 endometrial biopsy showed endometrioid carcinoma, FIGO grade 1.  10/29/2018- TA & TV Ultrasound revealed: Anteverted uterus measuring 8.7 x 5.6 x 6.4 cm without evidence of focal masses.  The endometrium measuring 24.1 mm (thickened) and heterogeneous.  Right and left ovaries not visualized.  No adnexal masses identified.  No free fluid in cul-de-sac.  Patient was seen by Dr. Theora Gianotti in clinic on 11/13/2018.  Cervical exam reveals 2 cm exophytic irregular mass consistent with malignancy.   11/19/2018 CT chest abdomen pelvis with contrast showed thickened endometrium with some irregularity compatible with the provided diagnosis of endometrial malignancy.  There is a mildly prominent left inguinal node 1.4 cm.  Patient was seen by Dr. Fransisca Connors on 11/20/2018 and left groin lymph node biopsy was recommended.  11/26/2018 patient underwent  left inguinal lymph node biopsy. Pathology showed metastatic adenocarcinoma consistent with colorectal origin.  CDX 2+.  Case was discussed on tumor board.  Recommend colonoscopy for further evaluation.  Patient reports significant weight loss 30 pounds over the last year.  Chronic vaginal spotting. Change of bowel habits the past few months.  More constipated.  Family history positive for brother who has colon cancer prostate cancer.  patient has underwent colonoscopy on 12/03/2018 which reviewed a nonobstructing large mass in the rectum.  Also chronic fistula.  Mass was not circumferential.  This was biopsied with a cold forceps for histology.  Pathology came back hyperplastic polyp negative for dysplasia and malignancy. [name], patient underwent flex sigmoidoscopy on 12/06/2018 with rebiopsy of the rectal mass. This time biopsy results came back positive for invasive colorectal adenocarcinoma, moderately differentiated. Immunotherapy for nearly mismatch repair protein (MMR ) was performed.  There is no loss of MMR expression.  low probability of MSI high.   # Seen by [name] for evaluation of resectability for rectal cancer. In addition, she also had a second opinion with [facility] where her endometrial biopsy pathology was changed to  adenocarcinoma, consistent with colorectal primary.   [name]. She has home health that has been assisting with ostomy care  Patient was also evaluated by [facility] oncology.  Recommendation is to proceed with TNT with concurrent chemoradiation followed by neoadjuvant chemotherapy followed by surgical resection. Patient prefers to have treatment done locally with [facility].   # Oncology Treatment:  02/03/2019- 03/19/2019  concurrent Xeloda and radiation.  Xeloda dose 815m /m2 BID - rounded to 16561mBID- on days of radiation. 04/09/2019, started on FOLFOX with bolus 5-FU omitted.  07/16/2019  finished  8 cycles of FOLFOX.  # Patient has underwent 09/17/19 APR/posterior vaginectomy/TAH/BSO/VY-flap pT4b pN0 with close vaginal margin 0.2 mm.  Uterus and ovaries negative for malignancy. Patient reports bilateral lower extremity numbness and tingling, intermittent, left worse than right. She has lost a lot of weight since her APR surgery.   Surveillance plan Recommend history and physical/tumor marker monitoring every 3 to 6 months for the first 2 years after surgery and then every 6 months for a total of 5 years. Chest abdomen pelvis every 6 to 12 months for total 5 years. Colonoscopy 1 year after surgery, if no advanced adenoma, repeat in 3 years and then 5 years.  #Family history with half brother having's history of colon cancer prostate cancer.  Personal history of colorectal cancer.  Patient has not decided if she wants genetic testing.    INTERVAL HISTORY Jacqueline Yoder is a 74 y.o. female who has above history reviewed by me today presents for follow up.  She had CT chest abdomen pelvis for rectal cancer surveillance.  Incidental findings of non occlusive acute pulmonary embolus in the right main pulmonary artery, extending out to interlobar pulmonary artery.  No definite metastatic disease in chest abdomen pelvis. Trace presacral soft tissu density,probably treatment related. Attention on follow up.  She was sent to ER for PE and was started on IV heparin anticoagulation.  Venous duplex of the lower extremities showed bilateral DVT. Patient has no shortness of breath, chest pain.  She was transitioned to Eliquis at discharge.  Today she presents for post hospitalization follow up. She has finished Eliquis 45m BID and now is on Eliquis 599mBID.  She feels that bilateral lower extremity numbness have improved. No bleeding events.   Review of Systems  Constitutional: Negative for appetite change, chills, fatigue, fever and unexpected weight change.  HENT:   Negative for hearing  loss and voice change.   Eyes: Negative for eye problems.  Respiratory: Negative for chest tightness and cough.   Cardiovascular: Negative for chest pain.  Gastrointestinal: Negative for abdominal distention, abdominal pain, blood in stool, constipation, diarrhea and nausea.       Colostomy bag  Endocrine: Negative for hot flashes.  Genitourinary: Negative for difficulty urinating and frequency.   Musculoskeletal: Negative for arthralgias.  Skin: Negative for itching and rash.  Neurological: Negative for extremity weakness and numbness.  Hematological: Negative for adenopathy.  Psychiatric/Behavioral: Negative for confusion.    MEDICAL HISTORY:  Past Medical History:  Diagnosis Date  . Arthritis   . [place]  . GERD (gastroesophageal reflux disease)   . Hypercholesteremia   . Hypertension   . Hypertension   . Lower extremity edema   . Urinary incontinence     SURGICAL HISTORY: Past Surgical History:  Procedure Laterality Date  . CHOLECYSTECTOMY  1971  . COLONOSCOPY WITH PROPOFOL N/A 12/03/2018   Procedure: COLONOSCOPY WITH PROPOFOL;  Surgeon: WoLucilla LameMD;  [facility];  Service: Endoscopy;  Laterality: N/A;  . FLEXIBLE SIGMOIDOSCOPY N/A 12/06/2018   Procedure: FLEXIBLE SIGMOIDOSCOPY;  Surgeon: AnJonathon BellowsMD;  [facility];  Service: Endoscopy;  Laterality: N/A;  . LAPAROSCOPIC COLOSTOMY  01/06/2019  . PORTACATH PLACEMENT N/A 04/03/2019   Procedure: INSERTION PORT-A-CATH;  Surgeon: PaJules HusbandsMD;  Location: [facility];  Service: General;  Laterality: N/A;    SOCIAL HISTORY: Social History   Socioeconomic History  . Marital status: Married    Spouse name: Not on file  . Number  of children: Not on file  . Years of education: Not on file  . Highest education level: Not on file  Occupational History  . Not on file  Tobacco Use  . Smoking status: Former Smoker    Quit date: 12/02/1977    Years since quitting: 42.1  .  Smokeless tobacco: Never Used  Substance and Sexual Activity  . Alcohol use: Never  . Drug use: Never  . Sexual activity: Not Currently    Birth control/protection: None  Other Topics Concern  . Not on file  Social History Narrative   Lives with daughter   Social Determinants of Health   Financial Resource Strain:   . Difficulty of Paying Living Expenses:   Food Insecurity:   . Worried About Charity fundraiser in the Last Year:   . Arboriculturist in the Last Year:   Transportation Needs:   . Film/video editor (Medical):   Marland Kitchen Lack of Transportation (Non-Medical):   Physical Activity:   . Days of Exercise per Week:   . Minutes of Exercise per Session:   Stress:   . Feeling of Stress :   Social Connections:   . Frequency of Communication with Friends and Family:   . Frequency of Social Gatherings with Friends and Family:   . Attends Religious Services:   . Active Member of Clubs or Organizations:   . Attends Archivist Meetings:   Marland Kitchen Marital Status:   Intimate Partner Violence:   . Fear of Current or Ex-Partner:   . Emotionally Abused:   Marland Kitchen Physically Abused:   . Sexually Abused:     FAMILY HISTORY: Family History  Problem Relation Age of Onset  . Colon cancer Brother   . Prostate cancer Brother   . Hypertension Mother   . Stroke Mother   . Kidney failure Father   . Breast cancer Neg Hx   . Ovarian cancer Neg Hx     ALLERGIES:  is allergic to sulfamethoxazole-trimethoprim.  MEDICATIONS:  Current Outpatient Medications  Medication Sig Dispense Refill  . apixaban (ELIQUIS) 5 MG TABS tablet Take 2 tablets (10 mg total) by mouth 2 (two) times daily for 7 days. 28 tablet 0  . chlorhexidine (PERIDEX) 0.12 % solution Use as directed 15 mLs in the mouth or throat 2 (two) times daily. 473 mL 3  . Cholecalciferol (VITAMIN D3) 2000 units capsule Take 2,000 Units by mouth daily.    Verneita Griffes Bark POWD Take by mouth.    . diclofenac sodium (VOLTAREN) 1 %  GEL Apply 2 g topically 4 (four) times daily as needed (joint pain).   11  . docusate sodium (COLACE) 100 MG capsule Take 100 mg by mouth 2 (two) times daily.    . fexofenadine (ALLEGRA) 180 MG tablet Take 180 mg by mouth daily.    Marland Kitchen FIBER ADULT GUMMIES PO Take 1 Dose by mouth daily.    . fluticasone (FLONASE) 50 MCG/ACT nasal spray Place 1 spray into both nostrils 2 (two) times a day.     . gabapentin (NEURONTIN) 100 MG capsule Take 1 capsule (100 mg total) by mouth at bedtime. 90 capsule 0  . HYDROcodone-acetaminophen (NORCO/VICODIN) 5-325 MG tablet Take 1 tablet by mouth every 4 (four) hours as needed for moderate pain. 8 tablet 0  . ipratropium (ATROVENT) 0.03 % nasal spray     . ketorolac (ACULAR) 0.4 % SOLN Place 1 drop into both eyes daily.     Marland Kitchen lidocaine-prilocaine (  EMLA) cream Apply 1 application topically as needed. 30 g 6  . loperamide (IMODIUM) 2 MG capsule Take 1 capsule (2 mg total) by mouth See admin instructions. With onset of loose stool, take 42m followed by 263mevery 2 hours,  Maximum: 16 mg/day 120 capsule 1  . Lutein 40 MG CAPS Take 40 mg by mouth daily.     . Lutein-Bilberry (BILBERRY PLUS LUTEIN) 02-999 MCG-MG CAPS Take 1 capsule by mouth daily.     . magic mouthwash w/lidocaine SOLN Take 5 mLs by mouth 4 (four) times daily as needed for mouth pain. Sig: Swish/Swallow 5-10 ml four times a day as needed 480 mL 3  . Multiple Vitamins-Minerals (ONE-A-DAY WOMENS 50 PLUS PO) Take 1 tablet by mouth daily.     . ondansetron (ZOFRAN) 8 MG tablet Take 1 tablet (8 mg total) by mouth every 8 (eight) hours as needed for nausea, vomiting or refractory nausea / vomiting. 90 tablet 1  . pantoprazole (PROTONIX) 40 MG tablet Take 1 tablet (40 mg total) by mouth as needed. (Patient taking differently: Take 40 mg by mouth daily as needed (heartburn). ) 30 tablet 0  . potassium chloride SA (K-DUR) 20 MEQ tablet Take 20 mEq by mouth daily.    . prednisoLONE acetate (PRED FORTE) 1 % ophthalmic  suspension Place 1 drop into both eyes 2 (two) times a day.   1  . prochlorperazine (COMPAZINE) 10 MG tablet Take 1 tablet (10 mg total) by mouth every 6 (six) hours as needed (Nausea or vomiting). 30 tablet 1  . simvastatin (ZOCOR) 40 MG tablet Take 40 mg by mouth at bedtime.     . triamterene-hydrochlorothiazide (DYAZIDE) 37.5-25 MG capsule Take 1 capsule by mouth daily.     . Marland Kitchenpixaban (ELIQUIS) 5 MG TABS tablet Take 1 tablet (5 mg total) by mouth 2 (two) times daily. (Patient not taking: Reported on 01/20/2020) 60 tablet 0   No current facility-administered medications for this visit.     PHYSICAL EXAMINATION: ECOG PERFORMANCE STATUS: 1 - Symptomatic but completely ambulatory Vitals:   01/21/20 1314  BP: 133/75  Pulse: 73  Resp: 16  Temp: (!) 96.7 F (35.9 C)   Filed Weights   01/21/20 1314  Weight: 171 lb 11.2 oz (77.9 kg)    Physical Exam Constitutional:      General: She is not in acute distress. HENT:     Head: Normocephalic and atraumatic.  Eyes:     General: No scleral icterus. Cardiovascular:     Rate and Rhythm: Normal rate and regular rhythm.     Heart sounds: Normal heart sounds.  Pulmonary:     Effort: Pulmonary effort is normal. No respiratory distress.     Breath sounds: No wheezing.  Abdominal:     General: Bowel sounds are normal. There is no distension.     Palpations: Abdomen is soft.  Musculoskeletal:        General: No deformity. Normal range of motion.     Cervical back: Normal range of motion and neck supple.  Skin:    General: Skin is warm and dry.     Findings: No erythema or rash.  Neurological:     Mental Status: She is alert and oriented to person, place, and time. Mental status is at baseline.     Cranial Nerves: No cranial nerve deficit.     Coordination: Coordination normal.  Psychiatric:        Mood and Affect: Mood normal.  CMP Latest Ref Rng & Units 01/14/2020  Glucose 70 - 99 mg/dL 102(H)  BUN 8 - 23 mg/dL 11  Creatinine  0.44 - 1.00 mg/dL 0.81  Sodium 135 - 145 mmol/L 141  Potassium 3.5 - 5.1 mmol/L 3.5  Chloride 98 - 111 mmol/L 105  CO2 22 - 32 mmol/L 28  Calcium 8.9 - 10.3 mg/dL 9.2  Total Protein 6.5 - 8.1 g/dL -  Total Bilirubin 0.3 - 1.2 mg/dL -  Alkaline Phos 38 - 126 U/L -  AST 15 - 41 U/L -  ALT 0 - 44 U/L -   CBC Latest Ref Rng & Units 01/21/2020  WBC 4.0 - 10.5 K/uL 2.9(L)  Hemoglobin 12.0 - 15.0 g/dL 11.7(L)  Hematocrit 36.0 - 46.0 % 37.3  Platelets 150 - 400 K/uL 247    LABORATORY DATA:  I have reviewed the data as listed Lab Results  Component Value Date   WBC 2.9 (L) 01/21/2020   HGB 11.7 (L) 01/21/2020   HCT 37.3 01/21/2020   MCV 92.8 01/21/2020   PLT 247 01/21/2020   Recent Labs    07/16/19 0803 07/16/19 0803 01/05/20 0928 01/13/20 0949 01/14/20 0724  NA 141   < > 139 139 141  K 3.6   < > 3.6 3.6 3.5  CL 107   < > 103 100 105  CO2 27   < > _0 GLUCOSE 116*   < > 96 86 102*  BUN 8   < > _1 CREATININE 0.87   < > 0.91 0.86 0.81  CALCIUM 9.5   < > 9.6 9.7 9.2  GFRNONAA >60   < > >60 >60 >60  GFRAA >60   < > >60 >60 >60  PROT 6.8  --  7.4 7.4  --   ALBUMIN 3.9  --  4.3 4.3  --   AST 27  --  23 28  --   ALT 13  --  14 13  --   ALKPHOS 36*  --  39 41  --   BILITOT 0.4  --  0.5 0.6  --    < > = values in this interval not displayed.   Iron/TIBC/Ferritin/ %Sat    Component Value Date/Time   IRON 59 01/21/2020 1257   TIBC 286 01/21/2020 1257   FERRITIN 436 (H) 01/21/2020 1257   IRONPCTSAT 21 01/21/2020 1257     RADIOGRAPHIC STUDIES: I have personally reviewed the radiological images as listed and agreed with the findings in the report. CT Chest W Contrast  Result Date: 01/13/2020 CLINICAL DATA:  Rectal cancer. Restaging. EXAM: CT CHEST, ABDOMEN, AND PELVIS WITH CONTRAST TECHNIQUE: Multidetector CT imaging of the chest, abdomen and pelvis was performed following the standard protocol during bolus administration of intravenous contrast. CONTRAST:  94m  OMNIPAQUE IOHEXOL 300 MG/ML  SOLN COMPARISON:  11/19/2018 FINDINGS: CT CHEST FINDINGS Cardiovascular: The heart size is normal. No substantial pericardial effusion. Coronary artery calcification is evident. Atherosclerotic calcification is noted in the wall of the thoracic aorta. Right Port-A-Cath tip is positioned at the SVC/RA junction. Nonocclusive acute pulmonary embolus identified in the right main pulmonary artery extending out into the interlobar pulmonary artery. There is a tiny string like pulmonary embolus in subsegmental arteries to the right lower lobe (image 36/series 2). Mediastinum/Nodes: No mediastinal lymphadenopathy. There is no hilar lymphadenopathy. The esophagus has normal imaging features. There is no axillary lymphadenopathy. Lungs/Pleura: No suspicious pulmonary nodule or mass. No consolidation. No  effusion. Minimal atelectasis noted posterior right base. Musculoskeletal: No worrisome lytic or sclerotic osseous abnormality. CT ABDOMEN PELVIS FINDINGS Hepatobiliary: No suspicious focal abnormality within the liver parenchyma. Gallbladder surgically absent. No intrahepatic or extrahepatic biliary dilation. Pancreas: No focal mass lesion. No dilatation of the main duct. No intraparenchymal cyst. No peripancreatic edema. Spleen: No splenomegaly. No focal mass lesion. Adrenals/Urinary Tract: No adrenal nodule or mass. Bilateral renal cysts again noted. There are tiny hypodensities in both kidneys that are too small to characterize, similar to prior. No evidence for hydroureter. The urinary bladder appears normal for the degree of distention. Stomach/Bowel: Stomach is unremarkable. No gastric wall thickening. No evidence of outlet obstruction. Duodenum is normally positioned as is the ligament of Treitz. No small bowel wall thickening. No small bowel dilatation. The appendix is normal. Sigmoid end colostomy noted. Vascular/Lymphatic: There is abdominal aortic atherosclerosis without aneurysm. There  is no gastrohepatic or hepatoduodenal ligament lymphadenopathy. No retroperitoneal or mesenteric lymphadenopathy. No pelvic sidewall lymphadenopathy. Laminar flow of un opacified blood identified in both common femoral veins. Reproductive: Uterus surgically absent. There is no adnexal mass. Skin thickening in the region the vaginal introitus may be related to prior surgery/radiation. Other: No intraperitoneal free fluid. There is some trace presacral soft tissue likely treatment related. Musculoskeletal: No worrisome lytic or sclerotic osseous abnormality. IMPRESSION: 1. No definite evidence for metastatic disease in the chest, abdomen, or pelvis. This is the patient's first postsurgical study in our system and will now establish baseline for follow-up imaging. 2. Nonocclusive acute pulmonary embolus in the right main pulmonary artery extending out into the interlobar pulmonary artery. Tiny string like pulmonary embolus identified in subsegmental arteries to the right lower lobe. 3. Trace presacral soft tissue density is probably treatment related. Attention on follow-up recommended. 4. Aortic Atherosclerosis (ICD10-I70.0). Critical Value/emergent results were called by telephone at the time of interpretation on 01/13/2020 at 9:13 am to provider [facility] , who verbally acknowledged these results. After this conversation, I discussed the findings of today's study with the patient and her daughter. The patient was in no acute distress and was not short of breath. They will proceed directly to the [facility] hospital emergency department after leaving the imaging center. We will contact triage at the emergency department and alert them to the patient's arrival. Electronically Signed   By: Misty Stanley M.D.   On: 01/13/2020 09:14   CT Abdomen Pelvis W Contrast  Result Date: 01/13/2020 CLINICAL DATA:  Rectal cancer. Restaging. EXAM: CT CHEST, ABDOMEN, AND PELVIS WITH CONTRAST TECHNIQUE: Multidetector CT imaging  of the chest, abdomen and pelvis was performed following the standard protocol during bolus administration of intravenous contrast. CONTRAST:  61m OMNIPAQUE IOHEXOL 300 MG/ML  SOLN COMPARISON:  11/19/2018 FINDINGS: CT CHEST FINDINGS Cardiovascular: The heart size is normal. No substantial pericardial effusion. Coronary artery calcification is evident. Atherosclerotic calcification is noted in the wall of the thoracic aorta. Right Port-A-Cath tip is positioned at the SVC/RA junction. Nonocclusive acute pulmonary embolus identified in the right main pulmonary artery extending out into the interlobar pulmonary artery. There is a tiny string like pulmonary embolus in subsegmental arteries to the right lower lobe (image 36/series 2). Mediastinum/Nodes: No mediastinal lymphadenopathy. There is no hilar lymphadenopathy. The esophagus has normal imaging features. There is no axillary lymphadenopathy. Lungs/Pleura: No suspicious pulmonary nodule or mass. No consolidation. No effusion. Minimal atelectasis noted posterior right base. Musculoskeletal: No worrisome lytic or sclerotic osseous abnormality. CT ABDOMEN PELVIS FINDINGS Hepatobiliary: No suspicious focal abnormality  within the liver parenchyma. Gallbladder surgically absent. No intrahepatic or extrahepatic biliary dilation. Pancreas: No focal mass lesion. No dilatation of the main duct. No intraparenchymal cyst. No peripancreatic edema. Spleen: No splenomegaly. No focal mass lesion. Adrenals/Urinary Tract: No adrenal nodule or mass. Bilateral renal cysts again noted. There are tiny hypodensities in both kidneys that are too small to characterize, similar to prior. No evidence for hydroureter. The urinary bladder appears normal for the degree of distention. Stomach/Bowel: Stomach is unremarkable. No gastric wall thickening. No evidence of outlet obstruction. Duodenum is normally positioned as is the ligament of Treitz. No small bowel wall thickening. No small bowel  dilatation. The appendix is normal. Sigmoid end colostomy noted. Vascular/Lymphatic: There is abdominal aortic atherosclerosis without aneurysm. There is no gastrohepatic or hepatoduodenal ligament lymphadenopathy. No retroperitoneal or mesenteric lymphadenopathy. No pelvic sidewall lymphadenopathy. Laminar flow of un opacified blood identified in both common femoral veins. Reproductive: Uterus surgically absent. There is no adnexal mass. Skin thickening in the region the vaginal introitus may be related to prior surgery/radiation. Other: No intraperitoneal free fluid. There is some trace presacral soft tissue likely treatment related. Musculoskeletal: No worrisome lytic or sclerotic osseous abnormality. IMPRESSION: 1. No definite evidence for metastatic disease in the chest, abdomen, or pelvis. This is the patient's first postsurgical study in our system and will now establish baseline for follow-up imaging. 2. Nonocclusive acute pulmonary embolus in the right main pulmonary artery extending out into the interlobar pulmonary artery. Tiny string like pulmonary embolus identified in subsegmental arteries to the right lower lobe. 3. Trace presacral soft tissue density is probably treatment related. Attention on follow-up recommended. 4. Aortic Atherosclerosis (ICD10-I70.0). Critical Value/emergent results were called by telephone at the time of interpretation on 01/13/2020 at 9:13 am to provider [facility] , who verbally acknowledged these results. After this conversation, I discussed the findings of today's study with the patient and her daughter. The patient was in no acute distress and was not short of breath. They will proceed directly to the [facility] hospital emergency department after leaving the imaging center. We will contact triage at the emergency department and alert them to the patient's arrival. Electronically Signed   By: Misty Stanley M.D.   On: 01/13/2020 09:14   US Venous Img Lower Bilateral  (DVT)  Result Date: 01/13/2020 CLINICAL DATA:  Acute pulmonary embolus. EXAM: BILATERAL LOWER EXTREMITY VENOUS DOPPLER ULTRASOUND TECHNIQUE: Gray-scale sonography with compression, as well as color and duplex ultrasound, were performed to evaluate the deep venous system(s) from the level of the common femoral vein through the popliteal and proximal calf veins. COMPARISON:  Chest CT earlier today, no prior lower extremity duplex. FINDINGS: VENOUS Right lower extremity: Normal compressibility of the common femoral, superficial femoral, and popliteal veins, as well as the visualized calf veins. Doppler waveforms show normal direction of venous flow, normal respiratory phasicity and response to augmentation. There is nonocclusive thrombus in the profundus femoral vein. No other filling defects to suggest DVT on grayscale or color Doppler imaging. Left lower extremity: There is nonocclusive thrombus throughout the left lower extremity veins including the common femoral, profundus femoral, femoral, popliteal, and posterior tibial veins. Vessels are partially compressible. No thrombus in the peroneal vein. OTHER None. Limitations: none IMPRESSION: 1. Positive for DVT throughout the left lower extremity veins with nonocclusive thrombus extending from the common femoral through the posterior tibial veins. 2. Nonocclusive thrombus in the right profundus femoral vein. No other DVT in the right lower extremity. These results  will be called to the ordering clinician or representative by the Radiologist Assistant, and communication documented in the PACS or [employer]. Electronically Signed   By: Keith Rake M.D.   On: 01/13/2020 17:28   ECHOCARDIOGRAM COMPLETE  Result Date: 01/14/2020    ECHOCARDIOGRAM REPORT   Patient Name:   LAELYNN BLIZZARD Date of Exam: 01/13/2020 Medical Rec #:  [id number]      Height:       65.0 in Accession #:    [id number]     Weight:       172.0 lb Date of Birth:  August 19, 1946      BSA:           1.855 m Patient Age:    40 years       BP:           129/68 mmHg Patient Gender: F              HR:           79 bpm. Exam Location:  [facility] Procedure: 2D Echo, Cardiac Doppler and Color Doppler Indications:     I26.99 Pulmonary Embolus  History:         Patient has no prior history of Echocardiogram examinations.                  Risk Factors:Hypertension and Dyslipidemia. Lower extremity                  edema.  Sonographer:     Wilford Sports Rodgers-Jones Referring Phys:  Unknown Foley NIU Diagnosing Phys: Nelva Bush MD IMPRESSIONS  1. Left ventricular ejection fraction, by estimation, is 60 to 65%. The left ventricle has normal function. The left ventricle has no regional wall motion abnormalities. There is mild left ventricular hypertrophy. Left ventricular diastolic parameters are consistent with Grade I diastolic dysfunction (impaired relaxation).  2. Right ventricular systolic function is normal. The right ventricular size is normal. Tricuspid regurgitation signal is inadequate for assessing PA pressure.  3. The mitral valve is degenerative. Trivial mitral valve regurgitation. No evidence of mitral stenosis.  4. The aortic valve is tricuspid. Aortic valve regurgitation is not visualized. No aortic stenosis is present. FINDINGS  Left Ventricle: Left ventricular ejection fraction, by estimation, is 60 to 65%. The left ventricle has normal function. The left ventricle has no regional wall motion abnormalities. The left ventricular internal cavity size was normal in size. There is  mild left ventricular hypertrophy. Left ventricular diastolic parameters are consistent with Grade I diastolic dysfunction (impaired relaxation). Right Ventricle: The right ventricular size is normal. No increase in right ventricular wall thickness. Right ventricular systolic function is normal. Tricuspid regurgitation signal is inadequate for assessing PA pressure. Left Atrium: Left atrial size was normal in size. Right Atrium: Right  atrial size was normal in size. Pericardium: There is no evidence of pericardial effusion. Mitral Valve: The mitral valve is degenerative in appearance. There is mild thickening of the mitral valve leaflet(s). Mild mitral annular calcification. Trivial mitral valve regurgitation. No evidence of mitral valve stenosis. Tricuspid Valve: The tricuspid valve is normal in structure. Tricuspid valve regurgitation is trivial. Aortic Valve: The aortic valve is tricuspid. Aortic valve regurgitation is not visualized. No aortic stenosis is present. Pulmonic Valve: The pulmonic valve was not well visualized. Pulmonic valve regurgitation is not visualized. No evidence of pulmonic stenosis. Aorta: The aortic root is normal in size and structure. Pulmonary Artery: The pulmonary artery is of normal size. Venous:  The inferior vena cava was not well visualized. IAS/Shunts: The interatrial septum was not well visualized. Additional Comments: A venous catheter is visualized in the right atrium.  LEFT VENTRICLE PLAX 2D LVIDd:         4.10 cm Diastology LVIDs:         2.67 cm LV e' lateral:   8.05 cm/s LV PW:         1.11 cm LV E/e' lateral: 10.7 LV IVS:        1.15 cm LV e' medial:    5.87 cm/s                        LV E/e' medial:  14.6  RIGHT VENTRICLE RV Basal diam:  4.14 cm RV S prime:     17.20 cm/s TAPSE (M-mode): 2.0 cm LEFT ATRIUM             Index       RIGHT ATRIUM           Index LA diam:        4.40 cm 2.37 cm/m  RA Area:     13.40 cm LA Vol (A2C):   33.0 ml 17.79 ml/m RA Volume:   36.20 ml  19.51 ml/m LA Vol (A4C):   47.1 ml 25.39 ml/m LA Biplane Vol: 42.2 ml 22.75 ml/m   AORTA Ao Root diam: 2.90 cm MITRAL VALVE MV Area (PHT): 2.62 cm MV Decel Time: 289 msec MV E velocity: 85.90 cm/s MV A velocity: 109.00 cm/s MV E/A ratio:  0.79 Harrell Gave End MD Electronically signed by Nelva Bush MD Signature Date/Time: 01/14/2020/6:41:59 AM    Final     CT Chest W Contrast  Result Date: 01/13/2020 CLINICAL DATA:   Rectal cancer. Restaging. EXAM: CT CHEST, ABDOMEN, AND PELVIS WITH CONTRAST TECHNIQUE: Multidetector CT imaging of the chest, abdomen and pelvis was performed following the standard protocol during bolus administration of intravenous contrast. CONTRAST:  15m OMNIPAQUE IOHEXOL 300 MG/ML  SOLN COMPARISON:  11/19/2018 FINDINGS: CT CHEST FINDINGS Cardiovascular: The heart size is normal. No substantial pericardial effusion. Coronary artery calcification is evident. Atherosclerotic calcification is noted in the wall of the thoracic aorta. Right Port-A-Cath tip is positioned at the SVC/RA junction. Nonocclusive acute pulmonary embolus identified in the right main pulmonary artery extending out into the interlobar pulmonary artery. There is a tiny string like pulmonary embolus in subsegmental arteries to the right lower lobe (image 36/series 2). Mediastinum/Nodes: No mediastinal lymphadenopathy. There is no hilar lymphadenopathy. The esophagus has normal imaging features. There is no axillary lymphadenopathy. Lungs/Pleura: No suspicious pulmonary nodule or mass. No consolidation. No effusion. Minimal atelectasis noted posterior right base. Musculoskeletal: No worrisome lytic or sclerotic osseous abnormality. CT ABDOMEN PELVIS FINDINGS Hepatobiliary: No suspicious focal abnormality within the liver parenchyma. Gallbladder surgically absent. No intrahepatic or extrahepatic biliary dilation. Pancreas: No focal mass lesion. No dilatation of the main duct. No intraparenchymal cyst. No peripancreatic edema. Spleen: No splenomegaly. No focal mass lesion. Adrenals/Urinary Tract: No adrenal nodule or mass. Bilateral renal cysts again noted. There are tiny hypodensities in both kidneys that are too small to characterize, similar to prior. No evidence for hydroureter. The urinary bladder appears normal for the degree of distention. Stomach/Bowel: Stomach is unremarkable. No gastric wall thickening. No evidence of outlet obstruction.  Duodenum is normally positioned as is the ligament of Treitz. No small bowel wall thickening. No small bowel dilatation. The appendix is normal. Sigmoid end colostomy noted.  Vascular/Lymphatic: There is abdominal aortic atherosclerosis without aneurysm. There is no gastrohepatic or hepatoduodenal ligament lymphadenopathy. No retroperitoneal or mesenteric lymphadenopathy. No pelvic sidewall lymphadenopathy. Laminar flow of un opacified blood identified in both common femoral veins. Reproductive: Uterus surgically absent. There is no adnexal mass. Skin thickening in the region the vaginal introitus may be related to prior surgery/radiation. Other: No intraperitoneal free fluid. There is some trace presacral soft tissue likely treatment related. Musculoskeletal: No worrisome lytic or sclerotic osseous abnormality. IMPRESSION: 1. No definite evidence for metastatic disease in the chest, abdomen, or pelvis. This is the patient's first postsurgical study in our system and will now establish baseline for follow-up imaging. 2. Nonocclusive acute pulmonary embolus in the right main pulmonary artery extending out into the interlobar pulmonary artery. Tiny string like pulmonary embolus identified in subsegmental arteries to the right lower lobe. 3. Trace presacral soft tissue density is probably treatment related. Attention on follow-up recommended. 4. Aortic Atherosclerosis (ICD10-I70.0). Critical Value/emergent results were called by telephone at the time of interpretation on 01/13/2020 at 9:13 am to provider [facility] , who verbally acknowledged these results. After this conversation, I discussed the findings of today's study with the patient and her daughter. The patient was in no acute distress and was not short of breath. They will proceed directly to the [facility] hospital emergency department after leaving the imaging center. We will contact triage at the emergency department and alert them to the patient's  arrival. Electronically Signed   By: Misty Stanley M.D.   On: 01/13/2020 09:14   CT Abdomen Pelvis W Contrast  Result Date: 01/13/2020 CLINICAL DATA:  Rectal cancer. Restaging. EXAM: CT CHEST, ABDOMEN, AND PELVIS WITH CONTRAST TECHNIQUE: Multidetector CT imaging of the chest, abdomen and pelvis was performed following the standard protocol during bolus administration of intravenous contrast. CONTRAST:  73m OMNIPAQUE IOHEXOL 300 MG/ML  SOLN COMPARISON:  11/19/2018 FINDINGS: CT CHEST FINDINGS Cardiovascular: The heart size is normal. No substantial pericardial effusion. Coronary artery calcification is evident. Atherosclerotic calcification is noted in the wall of the thoracic aorta. Right Port-A-Cath tip is positioned at the SVC/RA junction. Nonocclusive acute pulmonary embolus identified in the right main pulmonary artery extending out into the interlobar pulmonary artery. There is a tiny string like pulmonary embolus in subsegmental arteries to the right lower lobe (image 36/series 2). Mediastinum/Nodes: No mediastinal lymphadenopathy. There is no hilar lymphadenopathy. The esophagus has normal imaging features. There is no axillary lymphadenopathy. Lungs/Pleura: No suspicious pulmonary nodule or mass. No consolidation. No effusion. Minimal atelectasis noted posterior right base. Musculoskeletal: No worrisome lytic or sclerotic osseous abnormality. CT ABDOMEN PELVIS FINDINGS Hepatobiliary: No suspicious focal abnormality within the liver parenchyma. Gallbladder surgically absent. No intrahepatic or extrahepatic biliary dilation. Pancreas: No focal mass lesion. No dilatation of the main duct. No intraparenchymal cyst. No peripancreatic edema. Spleen: No splenomegaly. No focal mass lesion. Adrenals/Urinary Tract: No adrenal nodule or mass. Bilateral renal cysts again noted. There are tiny hypodensities in both kidneys that are too small to characterize, similar to prior. No evidence for hydroureter. The urinary  bladder appears normal for the degree of distention. Stomach/Bowel: Stomach is unremarkable. No gastric wall thickening. No evidence of outlet obstruction. Duodenum is normally positioned as is the ligament of Treitz. No small bowel wall thickening. No small bowel dilatation. The appendix is normal. Sigmoid end colostomy noted. Vascular/Lymphatic: There is abdominal aortic atherosclerosis without aneurysm. There is no gastrohepatic or hepatoduodenal ligament lymphadenopathy. No retroperitoneal or mesenteric lymphadenopathy. No pelvic sidewall  lymphadenopathy. Laminar flow of un opacified blood identified in both common femoral veins. Reproductive: Uterus surgically absent. There is no adnexal mass. Skin thickening in the region the vaginal introitus may be related to prior surgery/radiation. Other: No intraperitoneal free fluid. There is some trace presacral soft tissue likely treatment related. Musculoskeletal: No worrisome lytic or sclerotic osseous abnormality. IMPRESSION: 1. No definite evidence for metastatic disease in the chest, abdomen, or pelvis. This is the patient's first postsurgical study in our system and will now establish baseline for follow-up imaging. 2. Nonocclusive acute pulmonary embolus in the right main pulmonary artery extending out into the interlobar pulmonary artery. Tiny string like pulmonary embolus identified in subsegmental arteries to the right lower lobe. 3. Trace presacral soft tissue density is probably treatment related. Attention on follow-up recommended. 4. Aortic Atherosclerosis (ICD10-I70.0). Critical Value/emergent results were called by telephone at the time of interpretation on 01/13/2020 at 9:13 am to provider [facility] , who verbally acknowledged these results. After this conversation, I discussed the findings of today's study with the patient and her daughter. The patient was in no acute distress and was not short of breath. They will proceed directly to the [facility] hospital emergency department after leaving the imaging center. We will contact triage at the emergency department and alert them to the patient's arrival. Electronically Signed   By: Misty Stanley M.D.   On: 01/13/2020 09:14   US Venous Img Lower Bilateral (DVT)  Result Date: 01/13/2020 CLINICAL DATA:  Acute pulmonary embolus. EXAM: BILATERAL LOWER EXTREMITY VENOUS DOPPLER ULTRASOUND TECHNIQUE: Gray-scale sonography with compression, as well as color and duplex ultrasound, were performed to evaluate the deep venous system(s) from the level of the common femoral vein through the popliteal and proximal calf veins. COMPARISON:  Chest CT earlier today, no prior lower extremity duplex. FINDINGS: VENOUS Right lower extremity: Normal compressibility of the common femoral, superficial femoral, and popliteal veins, as well as the visualized calf veins. Doppler waveforms show normal direction of venous flow, normal respiratory phasicity and response to augmentation. There is nonocclusive thrombus in the profundus femoral vein. No other filling defects to suggest DVT on grayscale or color Doppler imaging. Left lower extremity: There is nonocclusive thrombus throughout the left lower extremity veins including the common femoral, profundus femoral, femoral, popliteal, and posterior tibial veins. Vessels are partially compressible. No thrombus in the peroneal vein. OTHER None. Limitations: none IMPRESSION: 1. Positive for DVT throughout the left lower extremity veins with nonocclusive thrombus extending from the common femoral through the posterior tibial veins. 2. Nonocclusive thrombus in the right profundus femoral vein. No other DVT in the right lower extremity. These results will be called to the ordering clinician or representative by the Radiologist Assistant, and communication documented in the PACS or [employer]. Electronically Signed   By: Keith Rake M.D.   On: 01/13/2020 17:28    ECHOCARDIOGRAM COMPLETE  Result Date: 01/14/2020    ECHOCARDIOGRAM REPORT   Patient Name:   HALLA CHOPP Date of Exam: 01/13/2020 Medical Rec #:  [id number]      Height:       65.0 in Accession #:    [id number]     Weight:       172.0 lb Date of Birth:  Oct 01, 1946      BSA:          1.855 m Patient Age:    66 years       BP:  129/68 mmHg Patient Gender: F              HR:           79 bpm. Exam Location:  [facility] Procedure: 2D Echo, Cardiac Doppler and Color Doppler Indications:     I26.99 Pulmonary Embolus  History:         Patient has no prior history of Echocardiogram examinations.                  Risk Factors:Hypertension and Dyslipidemia. Lower extremity                  edema.  Sonographer:     Wilford Sports Rodgers-Jones Referring Phys:  Unknown Foley NIU Diagnosing Phys: Nelva Bush MD IMPRESSIONS  1. Left ventricular ejection fraction, by estimation, is 60 to 65%. The left ventricle has normal function. The left ventricle has no regional wall motion abnormalities. There is mild left ventricular hypertrophy. Left ventricular diastolic parameters are consistent with Grade I diastolic dysfunction (impaired relaxation).  2. Right ventricular systolic function is normal. The right ventricular size is normal. Tricuspid regurgitation signal is inadequate for assessing PA pressure.  3. The mitral valve is degenerative. Trivial mitral valve regurgitation. No evidence of mitral stenosis.  4. The aortic valve is tricuspid. Aortic valve regurgitation is not visualized. No aortic stenosis is present. FINDINGS  Left Ventricle: Left ventricular ejection fraction, by estimation, is 60 to 65%. The left ventricle has normal function. The left ventricle has no regional wall motion abnormalities. The left ventricular internal cavity size was normal in size. There is  mild left ventricular hypertrophy. Left ventricular diastolic parameters are consistent with Grade I diastolic dysfunction (impaired relaxation). Right  Ventricle: The right ventricular size is normal. No increase in right ventricular wall thickness. Right ventricular systolic function is normal. Tricuspid regurgitation signal is inadequate for assessing PA pressure. Left Atrium: Left atrial size was normal in size. Right Atrium: Right atrial size was normal in size. Pericardium: There is no evidence of pericardial effusion. Mitral Valve: The mitral valve is degenerative in appearance. There is mild thickening of the mitral valve leaflet(s). Mild mitral annular calcification. Trivial mitral valve regurgitation. No evidence of mitral valve stenosis. Tricuspid Valve: The tricuspid valve is normal in structure. Tricuspid valve regurgitation is trivial. Aortic Valve: The aortic valve is tricuspid. Aortic valve regurgitation is not visualized. No aortic stenosis is present. Pulmonic Valve: The pulmonic valve was not well visualized. Pulmonic valve regurgitation is not visualized. No evidence of pulmonic stenosis. Aorta: The aortic root is normal in size and structure. Pulmonary Artery: The pulmonary artery is of normal size. Venous: The inferior vena cava was not well visualized. IAS/Shunts: The interatrial septum was not well visualized. Additional Comments: A venous catheter is visualized in the right atrium.  LEFT VENTRICLE PLAX 2D LVIDd:         4.10 cm Diastology LVIDs:         2.67 cm LV e' lateral:   8.05 cm/s LV PW:         1.11 cm LV E/e' lateral: 10.7 LV IVS:        1.15 cm LV e' medial:    5.87 cm/s                        LV E/e' medial:  14.6  RIGHT VENTRICLE RV Basal diam:  4.14 cm RV S prime:     17.20 cm/s TAPSE (M-mode): 2.0 cm  LEFT ATRIUM             Index       RIGHT ATRIUM           Index LA diam:        4.40 cm 2.37 cm/m  RA Area:     13.40 cm LA Vol (A2C):   33.0 ml 17.79 ml/m RA Volume:   36.20 ml  19.51 ml/m LA Vol (A4C):   47.1 ml 25.39 ml/m LA Biplane Vol: 42.2 ml 22.75 ml/m   AORTA Ao Root diam: 2.90 cm MITRAL VALVE MV Area (PHT): 2.62 cm  MV Decel Time: 289 msec MV E velocity: 85.90 cm/s MV A velocity: 109.00 cm/s MV E/A ratio:  0.79 Harrell Gave End MD Electronically signed by Nelva Bush MD Signature Date/Time: 01/14/2020/6:41:59 AM    Final     ASSESSMENT & PLAN:  1. Acute pulmonary embolism without acute cor pulmonale, unspecified pulmonary embolism type ([place])   2. Normocytic anemia   3. Rectal cancer ([place])   4. Neuropathy   5. Acute deep vein thrombosis (DVT) of femoral vein of both lower extremities (H[facility])   Cancer Staging Rectal cancer [facility]) Staging form: Colon and Rectum, AJCC 8th Edition - Clinical stage from 01/23/2019: Stage IIIC (cT4b, cN1a, cM0) - Signed by Earlie Server, MD on 01/23/2019 - Pathologic stage from 10/06/2019: Stage IIC (ypT4b, pN0, cM0) - Signed by Earlie Server, MD on 10/06/2019 #Clinical stage IIIc rectal cancer status post TNT followed by surgical resection. CEA trended up to 80.9,  CT images were independently reviewed and discussed with patient.  No definite evidence of metastatic disease. Attention on follow up on pre-sacral soft tissue.  Elevated CEA can be secondary to acute pulmonary embolism.  I will repeat CEA in 4 weeks.  #Acute pulmonary embolism, acute bilateral proximal DVT, unprovoked Currently on Eliquis 5 mg twice daily.  She is asymptomatic.  Continue. We discussed about Eliquis 5 mg twice daily for 6 months, followed by Eliquis 2.5 mg twice daily for maintenance.  #Anemia, normocytic.  Check iron panel. #Neuropathy, improved.  She is off gabapentin.  #Port-A-Cath in place, recommend patient to flush port every 6 to 8 weeks.  follow up 4 weeks.  Earlie Server, MD, PhD

## 2020-01-28 ENCOUNTER — Telehealth: Payer: Self-pay | Admitting: *Deleted

## 2020-01-28 DIAGNOSIS — Z7901 Long term (current) use of anticoagulants: Secondary | ICD-10-CM

## 2020-01-28 DIAGNOSIS — R319 Hematuria, unspecified: Secondary | ICD-10-CM

## 2020-01-28 NOTE — Telephone Encounter (Signed)
Patient accepts appointment for tomorrow at 68 for lab then see md

## 2020-01-28 NOTE — Telephone Encounter (Signed)
If mild hematuria, continue eliquis. Please arrange her to see me tmr. Lab -cbc and BMP and UA thanks.

## 2020-01-28 NOTE — Telephone Encounter (Signed)
Patient called stating she is on Eliquis and she has a little bit of blood in her urine. Please advise

## 2020-01-29 ENCOUNTER — Inpatient Hospital Stay (HOSPITAL_BASED_OUTPATIENT_CLINIC_OR_DEPARTMENT_OTHER): Payer: Medicare Other | Admitting: Oncology

## 2020-01-29 ENCOUNTER — Other Ambulatory Visit: Payer: Self-pay

## 2020-01-29 ENCOUNTER — Inpatient Hospital Stay: Payer: Medicare Other

## 2020-01-29 ENCOUNTER — Encounter: Payer: Self-pay | Admitting: Oncology

## 2020-01-29 VITALS — BP 111/72 | HR 73 | Temp 97.2°F | Resp 16 | Wt 176.0 lb

## 2020-01-29 DIAGNOSIS — Z7901 Long term (current) use of anticoagulants: Secondary | ICD-10-CM

## 2020-01-29 DIAGNOSIS — C20 Malignant neoplasm of rectum: Secondary | ICD-10-CM

## 2020-01-29 DIAGNOSIS — R319 Hematuria, unspecified: Secondary | ICD-10-CM

## 2020-01-29 DIAGNOSIS — N949 Unspecified condition associated with female genital organs and menstrual cycle: Secondary | ICD-10-CM | POA: Diagnosis not present

## 2020-01-29 LAB — CBC WITH DIFFERENTIAL/PLATELET
Abs Immature Granulocytes: 0.01 10*3/uL (ref 0.00–0.07)
Basophils Absolute: 0 10*3/uL (ref 0.0–0.1)
Basophils Relative: 1 %
Eosinophils Absolute: 0.1 10*3/uL (ref 0.0–0.5)
Eosinophils Relative: 2 %
HCT: 39.3 % (ref 36.0–46.0)
Hemoglobin: 12.5 g/dL (ref 12.0–15.0)
Immature Granulocytes: 0 %
Lymphocytes Relative: 41 %
Lymphs Abs: 1.2 10*3/uL (ref 0.7–4.0)
MCH: 29.6 pg (ref 26.0–34.0)
MCHC: 31.8 g/dL (ref 30.0–36.0)
MCV: 92.9 fL (ref 80.0–100.0)
Monocytes Absolute: 0.3 10*3/uL (ref 0.1–1.0)
Monocytes Relative: 8 %
Neutro Abs: 1.5 10*3/uL — ABNORMAL LOW (ref 1.7–7.7)
Neutrophils Relative %: 48 %
Platelets: 260 10*3/uL (ref 150–400)
RBC: 4.23 MIL/uL (ref 3.87–5.11)
RDW: 14.9 % (ref 11.5–15.5)
WBC: 3.1 10*3/uL — ABNORMAL LOW (ref 4.0–10.5)
nRBC: 0 % (ref 0.0–0.2)

## 2020-01-29 LAB — BASIC METABOLIC PANEL
Anion gap: 12 (ref 5–15)
BUN: 11 mg/dL (ref 8–23)
CO2: 29 mmol/L (ref 22–32)
Calcium: 9.6 mg/dL (ref 8.9–10.3)
Chloride: 98 mmol/L (ref 98–111)
Creatinine, Ser: 0.87 mg/dL (ref 0.44–1.00)
GFR calc Af Amer: >60 mL/min (ref >60)
GFR calc non Af Amer: >60 mL/min (ref >60)
Glucose, Bld: 91 mg/dL (ref 70–99)
Potassium: 3.5 mmol/L (ref 3.5–5.1)
Sodium: 139 mmol/L (ref 135–145)

## 2020-01-29 LAB — URINALYSIS, COMPLETE (UACMP) WITH MICROSCOPIC
Bacteria, UA: NONE SEEN
Bilirubin Urine: NEGATIVE
Glucose, UA: NEGATIVE mg/dL
Hgb urine dipstick: NEGATIVE
Ketones, ur: NEGATIVE mg/dL
Leukocytes,Ua: NEGATIVE
Nitrite: NEGATIVE
Protein, ur: NEGATIVE mg/dL
Specific Gravity, Urine: 1.003 — ABNORMAL LOW (ref 1.005–1.030)
Squamous Epithelial / HPF: NONE SEEN (ref 0–5)
pH: 8 (ref 5.0–8.0)

## 2020-01-29 NOTE — Progress Notes (Signed)
Patient noticed 1 occurrence of seeing blood with wiping after urination.  She has occasional irritation or burning during urination.

## 2020-01-29 NOTE — Progress Notes (Signed)
Hematology/Oncology progress note [facility] Telephone:([phone] [phone]   Patient Care Team: Kirk Ruths, MD as PCP - General (Internal Medicine) Clent Jacks, RN as Registered Nurse  REFERRING PROVIDER: Dr.Secord CHIEF COMPLAINTS/REASON FOR VISIT:  Follow up for rectal cancer  HISTORY OF PRESENTING ILLNESS:  Jacqueline Yoder is a  74 y.o.  female with PMH listed below who was referred to me for evaluation of endometrial cancer.   Patient initially presented with complaints of postmenopausal bleeding on 08/16/2018.  History of was menopausal vaginal bleeding in 2016 which resulted in cervical polypectomy.  Pathology 02/04/2015 showed cervical polyp, consistent with benign endometrial polyp.  [name] after polypectomy due to anxiety associated with pelvic exams.  pelvic exam on 08/16/2018 reviewed cervical abnormality and from enlarged uterus. Seen by Dr. Marcelline Mates on 10/29/2018.  Endometrial biopsy and a Pap smear was performed. 10/29/2018 Pap smear showed adenocarcinoma, favor endometrial origin. 10/29/2018 endometrial biopsy showed endometrioid carcinoma, FIGO grade 1.  10/29/2018- TA & TV Ultrasound revealed: Anteverted uterus measuring 8.7 x 5.6 x 6.4 cm without evidence of focal masses.  The endometrium measuring 24.1 mm (thickened) and heterogeneous.  Right and left ovaries not visualized.  No adnexal masses identified.  No free fluid in cul-de-sac.  Patient was seen by Dr. Theora Gianotti in clinic on 11/13/2018.  Cervical exam reveals 2 cm exophytic irregular mass consistent with malignancy.   11/19/2018 CT chest abdomen pelvis with contrast showed thickened endometrium with some irregularity compatible with the provided diagnosis of endometrial malignancy.  There is a mildly prominent left inguinal node 1.4 cm.  Patient was seen by Dr. Fransisca Connors on 11/20/2018 and left groin lymph node biopsy was recommended.  11/26/2018 patient underwent  left inguinal lymph node biopsy. Pathology showed metastatic adenocarcinoma consistent with colorectal origin.  CDX 2+.  Case was discussed on tumor board.  Recommend colonoscopy for further evaluation.  Patient reports significant weight loss 30 pounds over the last year.  Chronic vaginal spotting. Change of bowel habits the past few months.  More constipated.  Family history positive for brother who has colon cancer prostate cancer.  patient has underwent colonoscopy on 12/03/2018 which reviewed a nonobstructing large mass in the rectum.  Also chronic fistula.  Mass was not circumferential.  This was biopsied with a cold forceps for histology.  Pathology came back hyperplastic polyp negative for dysplasia and malignancy. [name], patient underwent flex sigmoidoscopy on 12/06/2018 with rebiopsy of the rectal mass. This time biopsy results came back positive for invasive colorectal adenocarcinoma, moderately differentiated. Immunotherapy for nearly mismatch repair protein (MMR ) was performed.  There is no loss of MMR expression.  low probability of MSI high.   # Seen by [name] for evaluation of resectability for rectal cancer. In addition, she also had a second opinion with [facility] where her endometrial biopsy pathology was changed to  adenocarcinoma, consistent with colorectal primary.   [name]. She has home health that has been assisting with ostomy care  Patient was also evaluated by [facility] oncology.  Recommendation is to proceed with TNT with concurrent chemoradiation followed by neoadjuvant chemotherapy followed by surgical resection. Patient prefers to have treatment done locally with [facility].   # Oncology Treatment:  02/03/2019- 03/19/2019  concurrent Xeloda and radiation.  Xeloda dose 884m /m2 BID - rounded to 16557mBID- on days of radiation. 04/09/2019, started on FOLFOX with bolus 5-FU omitted.  07/16/2019  finished  8 cycles of FOLFOX.  # Patient has underwent 09/17/19 APR/posterior vaginectomy/TAH/BSO/VY-flap pT4b pN0 with close vaginal margin 0.2 mm.  Uterus and ovaries negative for malignancy. Patient reports bilateral lower extremity numbness and tingling, intermittent, left worse than right. She has lost a lot of weight since her APR surgery.   Surveillance plan Recommend history and physical/tumor marker monitoring every 3 to 6 months for the first 2 years after surgery and then every 6 months for a total of 5 years. Chest abdomen pelvis every 6 to 12 months for total 5 years. Colonoscopy 1 year after surgery, if no advanced adenoma, repeat in 3 years and then 5 years.  #Family history with half brother having's history of colon cancer prostate cancer.  Personal history of colorectal cancer.  Patient has not decided if she wants genetic testing.    INTERVAL HISTORY Jacqueline Yoder is a 74 y.o. female who has above history reviewed by me today presents for acute visit for blood when she wipes after urination.  She is on Eliquis '5mg'$  Bid. She noticed " burning sensation" around her genina area. No dysuria, frequency or urgency.  She noticed some blood when she wipes.  No fever, chills, flank pain, abdominal pain. No other bleeding events.    Review of Systems  Constitutional: Negative for appetite change, chills, fatigue, fever and unexpected weight change.  HENT:   Negative for hearing loss and voice change.   Eyes: Negative for eye problems.  Respiratory: Negative for chest tightness and cough.   Cardiovascular: Negative for chest pain.  Gastrointestinal: Negative for abdominal distention, abdominal pain, blood in stool, constipation, diarrhea and nausea.       Colostomy bag  Endocrine: Negative for hot flashes.  Genitourinary: Negative for difficulty urinating and frequency.   Musculoskeletal: Negative for arthralgias.  Skin: Negative for itching and rash.  Neurological: Negative for  extremity weakness and numbness.  Hematological: Negative for adenopathy.  Psychiatric/Behavioral: Negative for confusion.    MEDICAL HISTORY:  Past Medical History:  Diagnosis Date  . Arthritis   . Cancer ([place])    rectal  . GERD (gastroesophageal reflux disease)   . Hypercholesteremia   . Hypertension   . Hypertension   . Lower extremity edema   . Urinary incontinence     SURGICAL HISTORY: Past Surgical History:  Procedure Laterality Date  . CHOLECYSTECTOMY  1971  . COLONOSCOPY WITH PROPOFOL N/A 12/03/2018   Procedure: COLONOSCOPY WITH PROPOFOL;  Surgeon: Lucilla Lame, MD;  Location: [facility] ENDOSCOPY;  Service: Endoscopy;  Laterality: N/A;  . FLEXIBLE SIGMOIDOSCOPY N/A 12/06/2018   Procedure: FLEXIBLE SIGMOIDOSCOPY;  Surgeon: [name], MD;  Location: [facility] ENDOSCOPY;  Service: Endoscopy;  Laterality: N/A;  . LAPAROSCOPIC COLOSTOMY  01/06/2019  . PORTACATH PLACEMENT N/A 04/03/2019   Procedure: INSERTION PORT-A-CATH;  Surgeon: Jules Husbands, MD;  Location: [facility] ORS;  Service: General;  Laterality: N/A;    SOCIAL HISTORY: Social History   Socioeconomic History  . Marital status: Married    Spouse name: Not on file  . Number of children: Not on file  . Years of education: Not on file  . Highest education level: Not on file  Occupational History  . Not on file  Tobacco Use  . Smoking status: Former Smoker    Quit date: 12/02/1977    Years since quitting: 42.1  . Smokeless tobacco: Never Used  Substance and Sexual Activity  . Alcohol use: Never  . Drug use: Never  . Sexual activity: Not Currently  Birth control/protection: None  Other Topics Concern  . Not on file  Social History Narrative   Lives with daughter   Social Determinants of Health   Financial Resource Strain:   . Difficulty of Paying Living Expenses:   Food Insecurity:   . Worried About Charity fundraiser in the Last Year:   . Arboriculturist in the Last Year:   Transportation Needs:   . Consulting civil engineer (Medical):   Marland Kitchen Lack of Transportation (Non-Medical):   Physical Activity:   . Days of Exercise per Week:   . Minutes of Exercise per Session:   Stress:   . Feeling of Stress :   Social Connections:   . Frequency of Communication with Friends and Family:   . Frequency of Social Gatherings with Friends and Family:   . Attends Religious Services:   . Active Member of Clubs or Organizations:   . Attends Archivist Meetings:   Marland Kitchen Marital Status:   Intimate Partner Violence:   . Fear of Current or Ex-Partner:   . Emotionally Abused:   Marland Kitchen Physically Abused:   . Sexually Abused:     FAMILY HISTORY: Family History  Problem Relation Age of Onset  . Colon cancer Brother   . Prostate cancer Brother   . Hypertension Mother   . Stroke Mother   . Kidney failure Father   . Breast cancer Neg Hx   . Ovarian cancer Neg Hx     ALLERGIES:  is allergic to sulfamethoxazole-trimethoprim.  MEDICATIONS:  Current Outpatient Medications  Medication Sig Dispense Refill  . apixaban (ELIQUIS) 5 MG TABS tablet Take 1 tablet (5 mg total) by mouth 2 (two) times daily. 60 tablet 0  . chlorhexidine (PERIDEX) 0.12 % solution Use as directed 15 mLs in the mouth or throat 2 (two) times daily. 473 mL 3  . Cholecalciferol (VITAMIN D3) 2000 units capsule Take 2,000 Units by mouth daily.    . diclofenac sodium (VOLTAREN) 1 % GEL Apply 2 g topically 4 (four) times daily as needed (joint pain).   11  . docusate sodium (COLACE) 100 MG capsule Take 100 mg by mouth 2 (two) times daily.    . fexofenadine (ALLEGRA) 180 MG tablet Take 180 mg by mouth daily.    Marland Kitchen FIBER ADULT GUMMIES PO Take 1 Dose by mouth daily.    . fluticasone (FLONASE) 50 MCG/ACT nasal spray Place 1 spray into both nostrils 2 (two) times a day.     . gabapentin (NEURONTIN) 100 MG capsule Take 1 capsule (100 mg total) by mouth at bedtime. 90 capsule 0  . HYDROcodone-acetaminophen (NORCO/VICODIN) 5-325 MG tablet Take 1 tablet  by mouth every 4 (four) hours as needed for moderate pain. 8 tablet 0  . ipratropium (ATROVENT) 0.03 % nasal spray     . ketorolac (ACULAR) 0.4 % SOLN Place 1 drop into both eyes daily.     Marland Kitchen lidocaine-prilocaine (EMLA) cream Apply 1 application topically as needed. 30 g 6  . loperamide (IMODIUM) 2 MG capsule Take 1 capsule (2 mg total) by mouth See admin instructions. With onset of loose stool, take '4mg'$  followed by '2mg'$  every 2 hours,  Maximum: 16 mg/day 120 capsule 1  . Lutein 40 MG CAPS Take 40 mg by mouth daily.     . Lutein-Bilberry (BILBERRY PLUS LUTEIN) 02-999 MCG-MG CAPS Take 1 capsule by mouth daily.     . magic mouthwash w/lidocaine SOLN Take 5 mLs by mouth 4 (  four) times daily as needed for mouth pain. Sig: Swish/Swallow 5-10 ml four times a day as needed 480 mL 3  . Multiple Vitamins-Minerals (ONE-A-DAY WOMENS 50 PLUS PO) Take 1 tablet by mouth daily.     . pantoprazole (PROTONIX) 40 MG tablet Take 1 tablet (40 mg total) by mouth as needed. (Patient taking differently: Take 40 mg by mouth daily as needed (heartburn). ) 30 tablet 0  . potassium chloride SA (K-DUR) 20 MEQ tablet Take 20 mEq by mouth daily.    . prednisoLONE acetate (PRED FORTE) 1 % ophthalmic suspension Place 1 drop into both eyes 2 (two) times a day.   1  . simvastatin (ZOCOR) 40 MG tablet Take 40 mg by mouth at bedtime.     . triamterene-hydrochlorothiazide (DYAZIDE) 37.5-25 MG capsule Take 1 capsule by mouth daily.     Verneita Griffes Bark POWD Take by mouth.    . ondansetron (ZOFRAN) 8 MG tablet Take 1 tablet (8 mg total) by mouth every 8 (eight) hours as needed for nausea, vomiting or refractory nausea / vomiting. (Patient not taking: Reported on 01/29/2020) 90 tablet 1  . prochlorperazine (COMPAZINE) 10 MG tablet Take 1 tablet (10 mg total) by mouth every 6 (six) hours as needed (Nausea or vomiting). (Patient not taking: Reported on 01/29/2020) 30 tablet 1   No current facility-administered medications for this visit.      PHYSICAL EXAMINATION: ECOG PERFORMANCE STATUS: 1 - Symptomatic but completely ambulatory Vitals:   01/29/20 1113  BP: 111/72  Pulse: 73  Resp: 16  Temp: (!) 97.2 F (36.2 C)   Filed Weights   01/29/20 1113  Weight: 176 lb (79.8 kg)    Physical Exam Constitutional:      General: She is not in acute distress. HENT:     Head: Normocephalic and atraumatic.  Eyes:     General: No scleral icterus. Cardiovascular:     Rate and Rhythm: Normal rate and regular rhythm.     Heart sounds: Normal heart sounds.  Pulmonary:     Effort: Pulmonary effort is normal. No respiratory distress.     Breath sounds: No wheezing.  Abdominal:     General: Bowel sounds are normal. There is no distension.     Palpations: Abdomen is soft.  Musculoskeletal:        General: No deformity. Normal range of motion.     Cervical back: Normal range of motion and neck supple.  Skin:    General: Skin is warm and dry.     Findings: No erythema or rash.  Neurological:     Mental Status: She is alert and oriented to person, place, and time. Mental status is at baseline.     Cranial Nerves: No cranial nerve deficit.     Coordination: Coordination normal.  Psychiatric:        Mood and Affect: Mood normal.    Genital examination revealed no ulcer, rash, or bleeding.  CMP Latest Ref Rng & Units 01/29/2020  Glucose 70 - 99 mg/dL 91  BUN 8 - 23 mg/dL 11  Creatinine 0.44 - 1.00 mg/dL 0.87  Sodium 135 - 145 mmol/L 139  Potassium 3.5 - 5.1 mmol/L 3.5  Chloride 98 - 111 mmol/L 98  CO2 22 - 32 mmol/L 29  Calcium 8.9 - 10.3 mg/dL 9.6  Total Protein 6.5 - 8.1 g/dL -  Total Bilirubin 0.3 - 1.2 mg/dL -  Alkaline Phos 38 - 126 U/L -  AST 15 - 41 U/L -  ALT 0 - 44 U/L -   CBC Latest Ref Rng & Units 01/29/2020  WBC 4.0 - 10.5 K/uL 3.1(L)  Hemoglobin 12.0 - 15.0 g/dL 12.5  Hematocrit 36.0 - 46.0 % 39.3  Platelets 150 - 400 K/uL 260    LABORATORY DATA:  I have reviewed the data as listed Lab Results   Component Value Date   WBC 3.1 (L) 01/29/2020   HGB 12.5 01/29/2020   HCT 39.3 01/29/2020   MCV 92.9 01/29/2020   PLT 260 01/29/2020   Recent Labs    07/16/19 0803 07/16/19 0803 01/05/20 0928 01/05/20 0928 01/13/20 0949 01/14/20 0724 01/29/20 1038  NA 141   < > 139   < > 139 141 139  K 3.6   < > 3.6   < > 3.6 3.5 3.5  CL 107   < > 103   < > 100 105 98  CO2 27   < > 26   < > '28 28 29  '$ GLUCOSE 116*   < > 96   < > 86 102* 91  BUN 8   < > 15   < > '11 11 11  '$ CREATININE 0.87   < > 0.91   < > 0.86 0.81 0.87  CALCIUM 9.5   < > 9.6   < > 9.7 9.2 9.6  GFRNONAA >60   < > >60   < > >60 >60 >60  GFRAA >60   < > >60   < > >60 >60 >60  PROT 6.8  --  7.4  --  7.4  --   --   ALBUMIN 3.9  --  4.3  --  4.3  --   --   AST 27  --  23  --  28  --   --   ALT 13  --  14  --  13  --   --   ALKPHOS 36*  --  39  --  41  --   --   BILITOT 0.4  --  0.5  --  0.6  --   --    < > = values in this interval not displayed.   Iron/TIBC/Ferritin/ %Sat    Component Value Date/Time   IRON 59 01/21/2020 1257   TIBC 286 01/21/2020 1257   FERRITIN 436 (H) 01/21/2020 1257   IRONPCTSAT 21 01/21/2020 1257     RADIOGRAPHIC STUDIES: I have personally reviewed the radiological images as listed and agreed with the findings in the report. CT Chest W Contrast  Result Date: 01/13/2020 CLINICAL DATA:  Rectal cancer. Restaging. EXAM: CT CHEST, ABDOMEN, AND PELVIS WITH CONTRAST TECHNIQUE: Multidetector CT imaging of the chest, abdomen and pelvis was performed following the standard protocol during bolus administration of intravenous contrast. CONTRAST:  50m OMNIPAQUE IOHEXOL 300 MG/ML  SOLN COMPARISON:  11/19/2018 FINDINGS: CT CHEST FINDINGS Cardiovascular: The heart size is normal. No substantial pericardial effusion. Coronary artery calcification is evident. Atherosclerotic calcification is noted in the wall of the thoracic aorta. Right Port-A-Cath tip is positioned at the SVC/RA junction. Nonocclusive acute pulmonary  embolus identified in the right main pulmonary artery extending out into the interlobar pulmonary artery. There is a tiny string like pulmonary embolus in subsegmental arteries to the right lower lobe (image 36/series 2). Mediastinum/Nodes: No mediastinal lymphadenopathy. There is no hilar lymphadenopathy. The esophagus has normal imaging features. There is no axillary lymphadenopathy. Lungs/Pleura: No suspicious pulmonary nodule or mass. No consolidation. No effusion. Minimal atelectasis noted posterior right  base. Musculoskeletal: No worrisome lytic or sclerotic osseous abnormality. CT ABDOMEN PELVIS FINDINGS Hepatobiliary: No suspicious focal abnormality within the liver parenchyma. Gallbladder surgically absent. No intrahepatic or extrahepatic biliary dilation. Pancreas: No focal mass lesion. No dilatation of the main duct. No intraparenchymal cyst. No peripancreatic edema. Spleen: No splenomegaly. No focal mass lesion. Adrenals/Urinary Tract: No adrenal nodule or mass. Bilateral renal cysts again noted. There are tiny hypodensities in both kidneys that are too small to characterize, similar to prior. No evidence for hydroureter. The urinary bladder appears normal for the degree of distention. Stomach/Bowel: Stomach is unremarkable. No gastric wall thickening. No evidence of outlet obstruction. Duodenum is normally positioned as is the ligament of Treitz. No small bowel wall thickening. No small bowel dilatation. The appendix is normal. Sigmoid end colostomy noted. Vascular/Lymphatic: There is abdominal aortic atherosclerosis without aneurysm. There is no gastrohepatic or hepatoduodenal ligament lymphadenopathy. No retroperitoneal or mesenteric lymphadenopathy. No pelvic sidewall lymphadenopathy. Laminar flow of un opacified blood identified in both common femoral veins. Reproductive: Uterus surgically absent. There is no adnexal mass. Skin thickening in the region the vaginal introitus may be related to prior  surgery/radiation. Other: No intraperitoneal free fluid. There is some trace presacral soft tissue likely treatment related. Musculoskeletal: No worrisome lytic or sclerotic osseous abnormality. IMPRESSION: 1. No definite evidence for metastatic disease in the chest, abdomen, or pelvis. This is the patient's first postsurgical study in our system and will now establish baseline for follow-up imaging. 2. Nonocclusive acute pulmonary embolus in the right main pulmonary artery extending out into the interlobar pulmonary artery. Tiny string like pulmonary embolus identified in subsegmental arteries to the right lower lobe. 3. Trace presacral soft tissue density is probably treatment related. Attention on follow-up recommended. 4. Aortic Atherosclerosis (ICD10-I70.0). Critical Value/emergent results were called by telephone at the time of interpretation on 01/13/2020 at 9:13 am to provider [facility] , who verbally acknowledged these results. After this conversation, I discussed the findings of today's study with the patient and her daughter. The patient was in no acute distress and was not short of breath. They will proceed directly to the [facility] hospital emergency department after leaving the imaging center. We will contact triage at the emergency department and alert them to the patient's arrival. Electronically Signed   By: Misty Stanley M.D.   On: 01/13/2020 09:14   CT Abdomen Pelvis W Contrast  Result Date: 01/13/2020 CLINICAL DATA:  Rectal cancer. Restaging. EXAM: CT CHEST, ABDOMEN, AND PELVIS WITH CONTRAST TECHNIQUE: Multidetector CT imaging of the chest, abdomen and pelvis was performed following the standard protocol during bolus administration of intravenous contrast. CONTRAST:  58m OMNIPAQUE IOHEXOL 300 MG/ML  SOLN COMPARISON:  11/19/2018 FINDINGS: CT CHEST FINDINGS Cardiovascular: The heart size is normal. No substantial pericardial effusion. Coronary artery calcification is evident.  Atherosclerotic calcification is noted in the wall of the thoracic aorta. Right Port-A-Cath tip is positioned at the SVC/RA junction. Nonocclusive acute pulmonary embolus identified in the right main pulmonary artery extending out into the interlobar pulmonary artery. There is a tiny string like pulmonary embolus in subsegmental arteries to the right lower lobe (image 36/series 2). Mediastinum/Nodes: No mediastinal lymphadenopathy. There is no hilar lymphadenopathy. The esophagus has normal imaging features. There is no axillary lymphadenopathy. Lungs/Pleura: No suspicious pulmonary nodule or mass. No consolidation. No effusion. Minimal atelectasis noted posterior right base. Musculoskeletal: No worrisome lytic or sclerotic osseous abnormality. CT ABDOMEN PELVIS FINDINGS Hepatobiliary: No suspicious focal abnormality within the liver parenchyma. Gallbladder surgically  absent. No intrahepatic or extrahepatic biliary dilation. Pancreas: No focal mass lesion. No dilatation of the main duct. No intraparenchymal cyst. No peripancreatic edema. Spleen: No splenomegaly. No focal mass lesion. Adrenals/Urinary Tract: No adrenal nodule or mass. Bilateral renal cysts again noted. There are tiny hypodensities in both kidneys that are too small to characterize, similar to prior. No evidence for hydroureter. The urinary bladder appears normal for the degree of distention. Stomach/Bowel: Stomach is unremarkable. No gastric wall thickening. No evidence of outlet obstruction. Duodenum is normally positioned as is the ligament of Treitz. No small bowel wall thickening. No small bowel dilatation. The appendix is normal. Sigmoid end colostomy noted. Vascular/Lymphatic: There is abdominal aortic atherosclerosis without aneurysm. There is no gastrohepatic or hepatoduodenal ligament lymphadenopathy. No retroperitoneal or mesenteric lymphadenopathy. No pelvic sidewall lymphadenopathy. Laminar flow of un opacified blood identified in both  common femoral veins. Reproductive: Uterus surgically absent. There is no adnexal mass. Skin thickening in the region the vaginal introitus may be related to prior surgery/radiation. Other: No intraperitoneal free fluid. There is some trace presacral soft tissue likely treatment related. Musculoskeletal: No worrisome lytic or sclerotic osseous abnormality. IMPRESSION: 1. No definite evidence for metastatic disease in the chest, abdomen, or pelvis. This is the patient's first postsurgical study in our system and will now establish baseline for follow-up imaging. 2. Nonocclusive acute pulmonary embolus in the right main pulmonary artery extending out into the interlobar pulmonary artery. Tiny string like pulmonary embolus identified in subsegmental arteries to the right lower lobe. 3. Trace presacral soft tissue density is probably treatment related. Attention on follow-up recommended. 4. Aortic Atherosclerosis (ICD10-I70.0). Critical Value/emergent results were called by telephone at the time of interpretation on 01/13/2020 at 9:13 am to provider [facility] , who verbally acknowledged these results. After this conversation, I discussed the findings of today's study with the patient and her daughter. The patient was in no acute distress and was not short of breath. They will proceed directly to the [facility] hospital emergency department after leaving the imaging center. We will contact triage at the emergency department and alert them to the patient's arrival. Electronically Signed   By: Misty Stanley M.D.   On: 01/13/2020 09:14   US Venous Img Lower Bilateral (DVT)  Result Date: 01/13/2020 CLINICAL DATA:  Acute pulmonary embolus. EXAM: BILATERAL LOWER EXTREMITY VENOUS DOPPLER ULTRASOUND TECHNIQUE: Gray-scale sonography with compression, as well as color and duplex ultrasound, were performed to evaluate the deep venous system(s) from the level of the common femoral vein through the popliteal and proximal calf  veins. COMPARISON:  Chest CT earlier today, no prior lower extremity duplex. FINDINGS: VENOUS Right lower extremity: Normal compressibility of the common femoral, superficial femoral, and popliteal veins, as well as the visualized calf veins. Doppler waveforms show normal direction of venous flow, normal respiratory phasicity and response to augmentation. There is nonocclusive thrombus in the profundus femoral vein. No other filling defects to suggest DVT on grayscale or color Doppler imaging. Left lower extremity: There is nonocclusive thrombus throughout the left lower extremity veins including the common femoral, profundus femoral, femoral, popliteal, and posterior tibial veins. Vessels are partially compressible. No thrombus in the peroneal vein. OTHER None. Limitations: none IMPRESSION: 1. Positive for DVT throughout the left lower extremity veins with nonocclusive thrombus extending from the common femoral through the posterior tibial veins. 2. Nonocclusive thrombus in the right profundus femoral vein. No other DVT in the right lower extremity. These results will be called to the ordering  clinician or representative by the Radiologist Assistant, and communication documented in the PACS or [employer]. Electronically Signed   By: Keith Rake M.D.   On: 01/13/2020 17:28   ECHOCARDIOGRAM COMPLETE  Result Date: 01/14/2020    ECHOCARDIOGRAM REPORT   Patient Name:   Jacqueline Yoder Date of Exam: 01/13/2020 Medical Rec #:  [id number]      Height:       65.0 in Accession #:    [id number]     Weight:       172.0 lb Date of Birth:  1946/03/25      BSA:          1.855 m Patient Age:    29 years       BP:           129/68 mmHg Patient Gender: F              HR:           79 bpm. Exam Location:  [facility] Procedure: 2D Echo, Cardiac Doppler and Color Doppler Indications:     I26.99 Pulmonary Embolus  History:         Patient has no prior history of Echocardiogram examinations.                  Risk  Factors:Hypertension and Dyslipidemia. Lower extremity                  edema.  Sonographer:     Wilford Sports Rodgers-Jones Referring Phys:  Unknown Foley NIU Diagnosing Phys: Nelva Bush MD IMPRESSIONS  1. Left ventricular ejection fraction, by estimation, is 60 to 65%. The left ventricle has normal function. The left ventricle has no regional wall motion abnormalities. There is mild left ventricular hypertrophy. Left ventricular diastolic parameters are consistent with Grade I diastolic dysfunction (impaired relaxation).  2. Right ventricular systolic function is normal. The right ventricular size is normal. Tricuspid regurgitation signal is inadequate for assessing PA pressure.  3. The mitral valve is degenerative. Trivial mitral valve regurgitation. No evidence of mitral stenosis.  4. The aortic valve is tricuspid. Aortic valve regurgitation is not visualized. No aortic stenosis is present. FINDINGS  Left Ventricle: Left ventricular ejection fraction, by estimation, is 60 to 65%. The left ventricle has normal function. The left ventricle has no regional wall motion abnormalities. The left ventricular internal cavity size was normal in size. There is  mild left ventricular hypertrophy. Left ventricular diastolic parameters are consistent with Grade I diastolic dysfunction (impaired relaxation). Right Ventricle: The right ventricular size is normal. No increase in right ventricular wall thickness. Right ventricular systolic function is normal. Tricuspid regurgitation signal is inadequate for assessing PA pressure. Left Atrium: Left atrial size was normal in size. Right Atrium: Right atrial size was normal in size. Pericardium: There is no evidence of pericardial effusion. Mitral Valve: The mitral valve is degenerative in appearance. There is mild thickening of the mitral valve leaflet(s). Mild mitral annular calcification. Trivial mitral valve regurgitation. No evidence of mitral valve stenosis. Tricuspid Valve: The  tricuspid valve is normal in structure. Tricuspid valve regurgitation is trivial. Aortic Valve: The aortic valve is tricuspid. Aortic valve regurgitation is not visualized. No aortic stenosis is present. Pulmonic Valve: The pulmonic valve was not well visualized. Pulmonic valve regurgitation is not visualized. No evidence of pulmonic stenosis. Aorta: The aortic root is normal in size and structure. Pulmonary Artery: The pulmonary artery is of normal size. Venous: The inferior vena cava was not  well visualized. IAS/Shunts: The interatrial septum was not well visualized. Additional Comments: A venous catheter is visualized in the right atrium.  LEFT VENTRICLE PLAX 2D LVIDd:         4.10 cm Diastology LVIDs:         2.67 cm LV e' lateral:   8.05 cm/s LV PW:         1.11 cm LV E/e' lateral: 10.7 LV IVS:        1.15 cm LV e' medial:    5.87 cm/s                        LV E/e' medial:  14.6  RIGHT VENTRICLE RV Basal diam:  4.14 cm RV S prime:     17.20 cm/s TAPSE (M-mode): 2.0 cm LEFT ATRIUM             Index       RIGHT ATRIUM           Index LA diam:        4.40 cm 2.37 cm/m  RA Area:     13.40 cm LA Vol (A2C):   33.0 ml 17.79 ml/m RA Volume:   36.20 ml  19.51 ml/m LA Vol (A4C):   47.1 ml 25.39 ml/m LA Biplane Vol: 42.2 ml 22.75 ml/m   AORTA Ao Root diam: 2.90 cm MITRAL VALVE MV Area (PHT): 2.62 cm MV Decel Time: 289 msec MV E velocity: 85.90 cm/s MV A velocity: 109.00 cm/s MV E/A ratio:  0.79 Harrell Gave End MD Electronically signed by Nelva Bush MD Signature Date/Time: 01/14/2020/6:41:59 AM    Final     CT Chest W Contrast  Result Date: 01/13/2020 CLINICAL DATA:  Rectal cancer. Restaging. EXAM: CT CHEST, ABDOMEN, AND PELVIS WITH CONTRAST TECHNIQUE: Multidetector CT imaging of the chest, abdomen and pelvis was performed following the standard protocol during bolus administration of intravenous contrast. CONTRAST:  67m OMNIPAQUE IOHEXOL 300 MG/ML  SOLN COMPARISON:  11/19/2018 FINDINGS: CT CHEST FINDINGS  Cardiovascular: The heart size is normal. No substantial pericardial effusion. Coronary artery calcification is evident. Atherosclerotic calcification is noted in the wall of the thoracic aorta. Right Port-A-Cath tip is positioned at the SVC/RA junction. Nonocclusive acute pulmonary embolus identified in the right main pulmonary artery extending out into the interlobar pulmonary artery. There is a tiny string like pulmonary embolus in subsegmental arteries to the right lower lobe (image 36/series 2). Mediastinum/Nodes: No mediastinal lymphadenopathy. There is no hilar lymphadenopathy. The esophagus has normal imaging features. There is no axillary lymphadenopathy. Lungs/Pleura: No suspicious pulmonary nodule or mass. No consolidation. No effusion. Minimal atelectasis noted posterior right base. Musculoskeletal: No worrisome lytic or sclerotic osseous abnormality. CT ABDOMEN PELVIS FINDINGS Hepatobiliary: No suspicious focal abnormality within the liver parenchyma. Gallbladder surgically absent. No intrahepatic or extrahepatic biliary dilation. Pancreas: No focal mass lesion. No dilatation of the main duct. No intraparenchymal cyst. No peripancreatic edema. Spleen: No splenomegaly. No focal mass lesion. Adrenals/Urinary Tract: No adrenal nodule or mass. Bilateral renal cysts again noted. There are tiny hypodensities in both kidneys that are too small to characterize, similar to prior. No evidence for hydroureter. The urinary bladder appears normal for the degree of distention. Stomach/Bowel: Stomach is unremarkable. No gastric wall thickening. No evidence of outlet obstruction. Duodenum is normally positioned as is the ligament of Treitz. No small bowel wall thickening. No small bowel dilatation. The appendix is normal. Sigmoid end colostomy noted. Vascular/Lymphatic: There is abdominal aortic atherosclerosis without  aneurysm. There is no gastrohepatic or hepatoduodenal ligament lymphadenopathy. No retroperitoneal or  mesenteric lymphadenopathy. No pelvic sidewall lymphadenopathy. Laminar flow of un opacified blood identified in both common femoral veins. Reproductive: Uterus surgically absent. There is no adnexal mass. Skin thickening in the region the vaginal introitus may be related to prior surgery/radiation. Other: No intraperitoneal free fluid. There is some trace presacral soft tissue likely treatment related. Musculoskeletal: No worrisome lytic or sclerotic osseous abnormality. IMPRESSION: 1. No definite evidence for metastatic disease in the chest, abdomen, or pelvis. This is the patient's first postsurgical study in our system and will now establish baseline for follow-up imaging. 2. Nonocclusive acute pulmonary embolus in the right main pulmonary artery extending out into the interlobar pulmonary artery. Tiny string like pulmonary embolus identified in subsegmental arteries to the right lower lobe. 3. Trace presacral soft tissue density is probably treatment related. Attention on follow-up recommended. 4. Aortic Atherosclerosis (ICD10-I70.0). Critical Value/emergent results were called by telephone at the time of interpretation on 01/13/2020 at 9:13 am to provider [facility] , who verbally acknowledged these results. After this conversation, I discussed the findings of today's study with the patient and her daughter. The patient was in no acute distress and was not short of breath. They will proceed directly to the [facility] hospital emergency department after leaving the imaging center. We will contact triage at the emergency department and alert them to the patient's arrival. Electronically Signed   By: Misty Stanley M.D.   On: 01/13/2020 09:14   CT Abdomen Pelvis W Contrast  Result Date: 01/13/2020 CLINICAL DATA:  Rectal cancer. Restaging. EXAM: CT CHEST, ABDOMEN, AND PELVIS WITH CONTRAST TECHNIQUE: Multidetector CT imaging of the chest, abdomen and pelvis was performed following the standard protocol during  bolus administration of intravenous contrast. CONTRAST:  43m OMNIPAQUE IOHEXOL 300 MG/ML  SOLN COMPARISON:  11/19/2018 FINDINGS: CT CHEST FINDINGS Cardiovascular: The heart size is normal. No substantial pericardial effusion. Coronary artery calcification is evident. Atherosclerotic calcification is noted in the wall of the thoracic aorta. Right Port-A-Cath tip is positioned at the SVC/RA junction. Nonocclusive acute pulmonary embolus identified in the right main pulmonary artery extending out into the interlobar pulmonary artery. There is a tiny string like pulmonary embolus in subsegmental arteries to the right lower lobe (image 36/series 2). Mediastinum/Nodes: No mediastinal lymphadenopathy. There is no hilar lymphadenopathy. The esophagus has normal imaging features. There is no axillary lymphadenopathy. Lungs/Pleura: No suspicious pulmonary nodule or mass. No consolidation. No effusion. Minimal atelectasis noted posterior right base. Musculoskeletal: No worrisome lytic or sclerotic osseous abnormality. CT ABDOMEN PELVIS FINDINGS Hepatobiliary: No suspicious focal abnormality within the liver parenchyma. Gallbladder surgically absent. No intrahepatic or extrahepatic biliary dilation. Pancreas: No focal mass lesion. No dilatation of the main duct. No intraparenchymal cyst. No peripancreatic edema. Spleen: No splenomegaly. No focal mass lesion. Adrenals/Urinary Tract: No adrenal nodule or mass. Bilateral renal cysts again noted. There are tiny hypodensities in both kidneys that are too small to characterize, similar to prior. No evidence for hydroureter. The urinary bladder appears normal for the degree of distention. Stomach/Bowel: Stomach is unremarkable. No gastric wall thickening. No evidence of outlet obstruction. Duodenum is normally positioned as is the ligament of Treitz. No small bowel wall thickening. No small bowel dilatation. The appendix is normal. Sigmoid end colostomy noted. Vascular/Lymphatic:  There is abdominal aortic atherosclerosis without aneurysm. There is no gastrohepatic or hepatoduodenal ligament lymphadenopathy. No retroperitoneal or mesenteric lymphadenopathy. No pelvic sidewall lymphadenopathy. Laminar flow of un opacified  blood identified in both common femoral veins. Reproductive: Uterus surgically absent. There is no adnexal mass. Skin thickening in the region the vaginal introitus may be related to prior surgery/radiation. Other: No intraperitoneal free fluid. There is some trace presacral soft tissue likely treatment related. Musculoskeletal: No worrisome lytic or sclerotic osseous abnormality. IMPRESSION: 1. No definite evidence for metastatic disease in the chest, abdomen, or pelvis. This is the patient's first postsurgical study in our system and will now establish baseline for follow-up imaging. 2. Nonocclusive acute pulmonary embolus in the right main pulmonary artery extending out into the interlobar pulmonary artery. Tiny string like pulmonary embolus identified in subsegmental arteries to the right lower lobe. 3. Trace presacral soft tissue density is probably treatment related. Attention on follow-up recommended. 4. Aortic Atherosclerosis (ICD10-I70.0). Critical Value/emergent results were called by telephone at the time of interpretation on 01/13/2020 at 9:13 am to provider [facility] , who verbally acknowledged these results. After this conversation, I discussed the findings of today's study with the patient and her daughter. The patient was in no acute distress and was not short of breath. They will proceed directly to the [facility] hospital emergency department after leaving the imaging center. We will contact triage at the emergency department and alert them to the patient's arrival. Electronically Signed   By: Misty Stanley M.D.   On: 01/13/2020 09:14   US Venous Img Lower Bilateral (DVT)  Result Date: 01/13/2020 CLINICAL DATA:  Acute pulmonary embolus. EXAM:  BILATERAL LOWER EXTREMITY VENOUS DOPPLER ULTRASOUND TECHNIQUE: Gray-scale sonography with compression, as well as color and duplex ultrasound, were performed to evaluate the deep venous system(s) from the level of the common femoral vein through the popliteal and proximal calf veins. COMPARISON:  Chest CT earlier today, no prior lower extremity duplex. FINDINGS: VENOUS Right lower extremity: Normal compressibility of the common femoral, superficial femoral, and popliteal veins, as well as the visualized calf veins. Doppler waveforms show normal direction of venous flow, normal respiratory phasicity and response to augmentation. There is nonocclusive thrombus in the profundus femoral vein. No other filling defects to suggest DVT on grayscale or color Doppler imaging. Left lower extremity: There is nonocclusive thrombus throughout the left lower extremity veins including the common femoral, profundus femoral, femoral, popliteal, and posterior tibial veins. Vessels are partially compressible. No thrombus in the peroneal vein. OTHER None. Limitations: none IMPRESSION: 1. Positive for DVT throughout the left lower extremity veins with nonocclusive thrombus extending from the common femoral through the posterior tibial veins. 2. Nonocclusive thrombus in the right profundus femoral vein. No other DVT in the right lower extremity. These results will be called to the ordering clinician or representative by the Radiologist Assistant, and communication documented in the PACS or [employer]. Electronically Signed   By: Keith Rake M.D.   On: 01/13/2020 17:28   ECHOCARDIOGRAM COMPLETE  Result Date: 01/14/2020    ECHOCARDIOGRAM REPORT   Patient Name:   Jacqueline Yoder Date of Exam: 01/13/2020 Medical Rec #:  [id number]      Height:       65.0 in Accession #:    [id number]     Weight:       172.0 lb Date of Birth:  Feb 10, 1946      BSA:          1.855 m Patient Age:    63 years       BP:           129/68 mmHg Patient  Gender: F              HR:           79 bpm. Exam Location:  [facility] Procedure: 2D Echo, Cardiac Doppler and Color Doppler Indications:     I26.99 Pulmonary Embolus  History:         Patient has no prior history of Echocardiogram examinations.                  Risk Factors:Hypertension and Dyslipidemia. Lower extremity                  edema.  Sonographer:     Wilford Sports Rodgers-Jones Referring Phys:  Unknown Foley NIU Diagnosing Phys: Nelva Bush MD IMPRESSIONS  1. Left ventricular ejection fraction, by estimation, is 60 to 65%. The left ventricle has normal function. The left ventricle has no regional wall motion abnormalities. There is mild left ventricular hypertrophy. Left ventricular diastolic parameters are consistent with Grade I diastolic dysfunction (impaired relaxation).  2. Right ventricular systolic function is normal. The right ventricular size is normal. Tricuspid regurgitation signal is inadequate for assessing PA pressure.  3. The mitral valve is degenerative. Trivial mitral valve regurgitation. No evidence of mitral stenosis.  4. The aortic valve is tricuspid. Aortic valve regurgitation is not visualized. No aortic stenosis is present. FINDINGS  Left Ventricle: Left ventricular ejection fraction, by estimation, is 60 to 65%. The left ventricle has normal function. The left ventricle has no regional wall motion abnormalities. The left ventricular internal cavity size was normal in size. There is  mild left ventricular hypertrophy. Left ventricular diastolic parameters are consistent with Grade I diastolic dysfunction (impaired relaxation). Right Ventricle: The right ventricular size is normal. No increase in right ventricular wall thickness. Right ventricular systolic function is normal. Tricuspid regurgitation signal is inadequate for assessing PA pressure. Left Atrium: Left atrial size was normal in size. Right Atrium: Right atrial size was normal in size. Pericardium: There is no evidence of  pericardial effusion. Mitral Valve: The mitral valve is degenerative in appearance. There is mild thickening of the mitral valve leaflet(s). Mild mitral annular calcification. Trivial mitral valve regurgitation. No evidence of mitral valve stenosis. Tricuspid Valve: The tricuspid valve is normal in structure. Tricuspid valve regurgitation is trivial. Aortic Valve: The aortic valve is tricuspid. Aortic valve regurgitation is not visualized. No aortic stenosis is present. Pulmonic Valve: The pulmonic valve was not well visualized. Pulmonic valve regurgitation is not visualized. No evidence of pulmonic stenosis. Aorta: The aortic root is normal in size and structure. Pulmonary Artery: The pulmonary artery is of normal size. Venous: The inferior vena cava was not well visualized. IAS/Shunts: The interatrial septum was not well visualized. Additional Comments: A venous catheter is visualized in the right atrium.  LEFT VENTRICLE PLAX 2D LVIDd:         4.10 cm Diastology LVIDs:         2.67 cm LV e' lateral:   8.05 cm/s LV PW:         1.11 cm LV E/e' lateral: 10.7 LV IVS:        1.15 cm LV e' medial:    5.87 cm/s                        LV E/e' medial:  14.6  RIGHT VENTRICLE RV Basal diam:  4.14 cm RV S prime:     17.20 cm/s TAPSE (M-mode): 2.0 cm LEFT ATRIUM  Index       RIGHT ATRIUM           Index LA diam:        4.40 cm 2.37 cm/m  RA Area:     13.40 cm LA Vol (A2C):   33.0 ml 17.79 ml/m RA Volume:   36.20 ml  19.51 ml/m LA Vol (A4C):   47.1 ml 25.39 ml/m LA Biplane Vol: 42.2 ml 22.75 ml/m   AORTA Ao Root diam: 2.90 cm MITRAL VALVE MV Area (PHT): 2.62 cm MV Decel Time: 289 msec MV E velocity: 85.90 cm/s MV A velocity: 109.00 cm/s MV E/A ratio:  0.79 Harrell Gave End MD Electronically signed by Nelva Bush MD Signature Date/Time: 01/14/2020/6:41:59 AM    Final     ASSESSMENT & PLAN:  1. Genital symptoms, female   2. Current use of long term anticoagulation   3. Rectal cancer [facility])   Cancer  Staging Rectal cancer [facility]) Staging form: Colon and Rectum, AJCC 8th Edition - Clinical stage from 01/23/2019: Stage IIIC (cT4b, cN1a, cM0) - Signed by Earlie Server, MD on 01/23/2019 - Pathologic stage from 10/06/2019: Stage IIC (ypT4b, pN0, cM0) - Signed by Earlie Server, MD on 10/06/2019  Genital symptoms,  Labs are reviewed and discussed with patient. No anemia, UA showed no microscopic RBCs.  Check urine culture.  Genital examination showed no rash, ulcer, sores. Monitor. If symptoms persist, advise pt to get evaluation by GYN.   # Neutropenia has improved.  #Acute pulmonary embolism, acute bilateral proximal DVT, unprovoked Continue Eliquis.  Keep previously planned appointments  Earlie Server, MD, PhD

## 2020-02-18 ENCOUNTER — Inpatient Hospital Stay: Payer: Medicare Other | Attending: Oncology

## 2020-02-18 ENCOUNTER — Other Ambulatory Visit: Payer: Self-pay

## 2020-02-18 DIAGNOSIS — Z95828 Presence of other vascular implants and grafts: Secondary | ICD-10-CM

## 2020-02-18 DIAGNOSIS — D72819 Decreased white blood cell count, unspecified: Secondary | ICD-10-CM | POA: Diagnosis not present

## 2020-02-18 DIAGNOSIS — D649 Anemia, unspecified: Secondary | ICD-10-CM | POA: Insufficient documentation

## 2020-02-18 DIAGNOSIS — Z87891 Personal history of nicotine dependence: Secondary | ICD-10-CM | POA: Insufficient documentation

## 2020-02-18 DIAGNOSIS — K5900 Constipation, unspecified: Secondary | ICD-10-CM | POA: Insufficient documentation

## 2020-02-18 DIAGNOSIS — C20 Malignant neoplasm of rectum: Secondary | ICD-10-CM | POA: Diagnosis not present

## 2020-02-18 DIAGNOSIS — K219 Gastro-esophageal reflux disease without esophagitis: Secondary | ICD-10-CM | POA: Insufficient documentation

## 2020-02-18 DIAGNOSIS — Z8542 Personal history of malignant neoplasm of other parts of uterus: Secondary | ICD-10-CM | POA: Insufficient documentation

## 2020-02-18 DIAGNOSIS — Z79899 Other long term (current) drug therapy: Secondary | ICD-10-CM | POA: Insufficient documentation

## 2020-02-18 DIAGNOSIS — Z800 Family history of malignant neoplasm of digestive organs: Secondary | ICD-10-CM | POA: Insufficient documentation

## 2020-02-18 DIAGNOSIS — I10 Essential (primary) hypertension: Secondary | ICD-10-CM | POA: Diagnosis not present

## 2020-02-18 DIAGNOSIS — R600 Localized edema: Secondary | ICD-10-CM | POA: Insufficient documentation

## 2020-02-18 DIAGNOSIS — Z86718 Personal history of other venous thrombosis and embolism: Secondary | ICD-10-CM | POA: Diagnosis not present

## 2020-02-18 DIAGNOSIS — N281 Cyst of kidney, acquired: Secondary | ICD-10-CM | POA: Insufficient documentation

## 2020-02-18 DIAGNOSIS — Z7901 Long term (current) use of anticoagulants: Secondary | ICD-10-CM | POA: Insufficient documentation

## 2020-02-18 DIAGNOSIS — E7800 Pure hypercholesterolemia, unspecified: Secondary | ICD-10-CM | POA: Diagnosis not present

## 2020-02-18 DIAGNOSIS — Z86711 Personal history of pulmonary embolism: Secondary | ICD-10-CM | POA: Diagnosis not present

## 2020-02-18 DIAGNOSIS — Z933 Colostomy status: Secondary | ICD-10-CM | POA: Diagnosis not present

## 2020-02-18 DIAGNOSIS — I700 Atherosclerosis of aorta: Secondary | ICD-10-CM | POA: Insufficient documentation

## 2020-02-18 DIAGNOSIS — R630 Anorexia: Secondary | ICD-10-CM | POA: Diagnosis not present

## 2020-02-18 LAB — COMPREHENSIVE METABOLIC PANEL
ALT: 14 U/L (ref 0–44)
AST: 22 U/L (ref 15–41)
Albumin: 4 g/dL (ref 3.5–5.0)
Alkaline Phosphatase: 34 U/L — ABNORMAL LOW (ref 38–126)
Anion gap: 9 (ref 5–15)
BUN: 14 mg/dL (ref 8–23)
CO2: 28 mmol/L (ref 22–32)
Calcium: 9.3 mg/dL (ref 8.9–10.3)
Chloride: 102 mmol/L (ref 98–111)
Creatinine, Ser: 1.01 mg/dL — ABNORMAL HIGH (ref 0.44–1.00)
GFR calc Af Amer: >60 mL/min (ref >60)
GFR calc non Af Amer: 56 mL/min — ABNORMAL LOW (ref >60)
Glucose, Bld: 103 mg/dL — ABNORMAL HIGH (ref 70–99)
Potassium: 3.7 mmol/L (ref 3.5–5.1)
Sodium: 139 mmol/L (ref 135–145)
Total Bilirubin: 0.6 mg/dL (ref 0.3–1.2)
Total Protein: 7 g/dL (ref 6.5–8.1)

## 2020-02-18 LAB — CBC WITH DIFFERENTIAL/PLATELET
Abs Immature Granulocytes: 0.02 10*3/uL (ref 0.00–0.07)
Basophils Absolute: 0 10*3/uL (ref 0.0–0.1)
Basophils Relative: 1 %
Eosinophils Absolute: 0.1 10*3/uL (ref 0.0–0.5)
Eosinophils Relative: 2 %
HCT: 36.1 % (ref 36.0–46.0)
Hemoglobin: 11.7 g/dL — ABNORMAL LOW (ref 12.0–15.0)
Immature Granulocytes: 1 %
Lymphocytes Relative: 37 %
Lymphs Abs: 1.1 10*3/uL (ref 0.7–4.0)
MCH: 29.8 pg (ref 26.0–34.0)
MCHC: 32.4 g/dL (ref 30.0–36.0)
MCV: 92.1 fL (ref 80.0–100.0)
Monocytes Absolute: 0.4 10*3/uL (ref 0.1–1.0)
Monocytes Relative: 12 %
Neutro Abs: 1.5 10*3/uL — ABNORMAL LOW (ref 1.7–7.7)
Neutrophils Relative %: 47 %
Platelets: 194 10*3/uL (ref 150–400)
RBC: 3.92 MIL/uL (ref 3.87–5.11)
RDW: 14 % (ref 11.5–15.5)
WBC: 3.1 10*3/uL — ABNORMAL LOW (ref 4.0–10.5)
nRBC: 0 % (ref 0.0–0.2)

## 2020-02-18 MED ORDER — SODIUM CHLORIDE 0.9% FLUSH
10.0000 mL | INTRAVENOUS | Status: DC | PRN
  Administered 2020-02-18: 10 mL via INTRAVENOUS
  Filled 2020-02-18: qty 10

## 2020-02-18 MED ORDER — HEPARIN SOD (PORK) LOCK FLUSH 100 UNIT/ML IV SOLN
500.0000 [IU] | Freq: Once | INTRAVENOUS | Status: AC
  Administered 2020-02-18: 500 [IU] via INTRAVENOUS
  Filled 2020-02-18: qty 5

## 2020-02-19 ENCOUNTER — Other Ambulatory Visit: Payer: Self-pay | Admitting: Internal Medicine

## 2020-02-19 LAB — CEA: CEA: 138 ng/mL — ABNORMAL HIGH (ref 0.0–4.7)

## 2020-02-20 ENCOUNTER — Inpatient Hospital Stay: Payer: Medicare Other | Admitting: Oncology

## 2020-02-20 ENCOUNTER — Other Ambulatory Visit: Payer: Self-pay

## 2020-02-20 ENCOUNTER — Encounter: Payer: Self-pay | Admitting: Oncology

## 2020-02-20 VITALS — BP 119/77 | HR 58 | Temp 96.0°F | Resp 18 | Wt 175.8 lb

## 2020-02-20 DIAGNOSIS — D649 Anemia, unspecified: Secondary | ICD-10-CM | POA: Diagnosis not present

## 2020-02-20 DIAGNOSIS — D709 Neutropenia, unspecified: Secondary | ICD-10-CM | POA: Diagnosis not present

## 2020-02-20 DIAGNOSIS — C20 Malignant neoplasm of rectum: Secondary | ICD-10-CM

## 2020-02-20 DIAGNOSIS — Z7901 Long term (current) use of anticoagulants: Secondary | ICD-10-CM

## 2020-02-20 LAB — FACTOR 5 LEIDEN

## 2020-02-20 LAB — PROTHROMBIN GENE MUTATION

## 2020-02-20 NOTE — Progress Notes (Signed)
Hematology/Oncology progress note [facility] Telephone:([phone] [phone]   Patient Care Team: Kirk Ruths, MD as PCP - General (Internal Medicine) Clent Jacks, RN as Registered Nurse  REFERRING PROVIDER: Dr.Secord CHIEF COMPLAINTS/REASON FOR VISIT:  Follow up for rectal cancer  HISTORY OF PRESENTING ILLNESS:  Jacqueline Yoder is a  74 y.o.  female with PMH listed below who was referred to me for evaluation of endometrial cancer.   Patient initially presented with complaints of postmenopausal bleeding on 08/16/2018.  History of was menopausal vaginal bleeding in 2016 which resulted in cervical polypectomy.  Pathology 02/04/2015 showed cervical polyp, consistent with benign endometrial polyp.  [name] after polypectomy due to anxiety associated with pelvic exams.  pelvic exam on 08/16/2018 reviewed cervical abnormality and from enlarged uterus. Seen by Dr. Marcelline Mates on 10/29/2018.  Endometrial biopsy and a Pap smear was performed. 10/29/2018 Pap smear showed adenocarcinoma, favor endometrial origin. 10/29/2018 endometrial biopsy showed endometrioid carcinoma, FIGO grade 1.  10/29/2018- TA & TV Ultrasound revealed: Anteverted uterus measuring 8.7 x 5.6 x 6.4 cm without evidence of focal masses.  The endometrium measuring 24.1 mm (thickened) and heterogeneous.  Right and left ovaries not visualized.  No adnexal masses identified.  No free fluid in cul-de-sac.  Patient was seen by Dr. Theora Gianotti in clinic on 11/13/2018.  Cervical exam reveals 2 cm exophytic irregular mass consistent with malignancy.   11/19/2018 CT chest abdomen pelvis with contrast showed thickened endometrium with some irregularity compatible with the provided diagnosis of endometrial malignancy.  There is a mildly prominent left inguinal node 1.4 cm.  Patient was seen by Dr. Fransisca Connors on 11/20/2018 and left groin lymph node biopsy was recommended.  11/26/2018 patient underwent  left inguinal lymph node biopsy. Pathology showed metastatic adenocarcinoma consistent with colorectal origin.  CDX 2+.  Case was discussed on tumor board.  Recommend colonoscopy for further evaluation.  Patient reports significant weight loss 30 pounds over the last year.  Chronic vaginal spotting. Change of bowel habits the past few months.  More constipated.  Family history positive for brother who has colon cancer prostate cancer.  patient has underwent colonoscopy on 12/03/2018 which reviewed a nonobstructing large mass in the rectum.  Also chronic fistula.  Mass was not circumferential.  This was biopsied with a cold forceps for histology.  Pathology came back hyperplastic polyp negative for dysplasia and malignancy. [name], patient underwent flex sigmoidoscopy on 12/06/2018 with rebiopsy of the rectal mass. This time biopsy results came back positive for invasive colorectal adenocarcinoma, moderately differentiated. Immunotherapy for nearly mismatch repair protein (MMR ) was performed.  There is no loss of MMR expression.  low probability of MSI high.   # Seen by [name] for evaluation of resectability for rectal cancer. In addition, she also had a second opinion with [facility] where her endometrial biopsy pathology was changed to  adenocarcinoma, consistent with colorectal primary.   [name]. She has home health that has been assisting with ostomy care  Patient was also evaluated by [facility] oncology.  Recommendation is to proceed with TNT with concurrent chemoradiation followed by neoadjuvant chemotherapy followed by surgical resection. Patient prefers to have treatment done locally with [facility].   # Oncology Treatment:  02/03/2019- 03/19/2019  concurrent Xeloda and radiation.  Xeloda dose 884m /m2 BID - rounded to 16557mBID- on days of radiation. 04/09/2019, started on FOLFOX with bolus 5-FU omitted.  07/16/2019  finished  8 cycles of FOLFOX.  # Patient has underwent 09/17/19 APR/posterior vaginectomy/TAH/BSO/VY-flap pT4b pN0 with close vaginal margin 0.2 mm.  Uterus and ovaries negative for malignancy. Patient reports bilateral lower extremity numbness and tingling, intermittent, left worse than right. She has lost a lot of weight since her APR surgery.   Surveillance plan Recommend history and physical/tumor marker monitoring every 3 to 6 months for the first 2 years after surgery and then every 6 months for a total of 5 years. Chest abdomen pelvis every 6 to 12 months for total 5 years. Colonoscopy 1 year after surgery, if no advanced adenoma, repeat in 3 years and then 5 years.  #Family history with half brother having's history of colon cancer prostate cancer.  Personal history of colorectal cancer.  Patient has not decided if she wants genetic testing.    INTERVAL HISTORY Jacqueline Yoder is a 74 y.o. female who has above history reviewed by me presents for follow-up of rectal cancer. Patient has no new complaints.  on Eliquis 5 mg twice daily.  No bleeding events.  Review of Systems  Constitutional: Negative for appetite change, chills, fatigue, fever and unexpected weight change.  HENT:   Negative for hearing loss and voice change.   Eyes: Negative for eye problems.  Respiratory: Negative for chest tightness and cough.   Cardiovascular: Negative for chest pain.  Gastrointestinal: Negative for abdominal distention, abdominal pain, blood in stool, constipation, diarrhea and nausea.       Colostomy bag  Endocrine: Negative for hot flashes.  Genitourinary: Negative for difficulty urinating and frequency.   Musculoskeletal: Negative for arthralgias.  Skin: Negative for itching and rash.  Neurological: Negative for extremity weakness and numbness.  Hematological: Negative for adenopathy.  Psychiatric/Behavioral: Negative for confusion.    MEDICAL HISTORY:  Past Medical History:  Diagnosis  Date  . Arthritis   . Cancer ([place])    rectal  . GERD (gastroesophageal reflux disease)   . Hypercholesteremia   . Hypertension   . Hypertension   . Lower extremity edema   . Urinary incontinence     SURGICAL HISTORY: Past Surgical History:  Procedure Laterality Date  . CHOLECYSTECTOMY  1971  . COLONOSCOPY WITH PROPOFOL N/A 12/03/2018   Procedure: COLONOSCOPY WITH PROPOFOL;  Surgeon: Lucilla Lame, MD;  Location: [facility] ENDOSCOPY;  Service: Endoscopy;  Laterality: N/A;  . FLEXIBLE SIGMOIDOSCOPY N/A 12/06/2018   Procedure: FLEXIBLE SIGMOIDOSCOPY;  Surgeon: [name], MD;  Location: [facility] ENDOSCOPY;  Service: Endoscopy;  Laterality: N/A;  . LAPAROSCOPIC COLOSTOMY  01/06/2019  . PORTACATH PLACEMENT N/A 04/03/2019   Procedure: INSERTION PORT-A-CATH;  Surgeon: Jules Husbands, MD;  Location: [facility] ORS;  Service: General;  Laterality: N/A;    SOCIAL HISTORY: Social History   Socioeconomic History  . Marital status: Married    Spouse name: Not on file  . Number of children: Not on file  . Years of education: Not on file  . Highest education level: Not on file  Occupational History  . Not on file  Tobacco Use  . Smoking status: Former Smoker    Quit date: 12/02/1977    Years since quitting: 42.2  . Smokeless tobacco: Never Used  Substance and Sexual Activity  . Alcohol use: Never  . Drug use: Never  . Sexual activity: Not Currently    Birth control/protection: None  Other Topics Concern  . Not on file  Social History Narrative   Lives with daughter   Social Determinants of Radio broadcast assistant  Strain:   . Difficulty of Paying Living Expenses:   Food Insecurity:   . Worried About Charity fundraiser in the Last Year:   . Arboriculturist in the Last Year:   Transportation Needs:   . Film/video editor (Medical):   Marland Kitchen Lack of Transportation (Non-Medical):   Physical Activity:   . Days of Exercise per Week:   . Minutes of Exercise per Session:   Stress:   .  Feeling of Stress :   Social Connections:   . Frequency of Communication with Friends and Family:   . Frequency of Social Gatherings with Friends and Family:   . Attends Religious Services:   . Active Member of Clubs or Organizations:   . Attends Archivist Meetings:   Marland Kitchen Marital Status:   Intimate Partner Violence:   . Fear of Current or Ex-Partner:   . Emotionally Abused:   Marland Kitchen Physically Abused:   . Sexually Abused:     FAMILY HISTORY: Family History  Problem Relation Age of Onset  . Colon cancer Brother   . Prostate cancer Brother   . Hypertension Mother   . Stroke Mother   . Kidney failure Father   . Breast cancer Neg Hx   . Ovarian cancer Neg Hx     ALLERGIES:  is allergic to sulfamethoxazole-trimethoprim.  MEDICATIONS:  Current Outpatient Medications  Medication Sig Dispense Refill  . apixaban (ELIQUIS) 5 MG TABS tablet Take 1 tablet (5 mg total) by mouth 2 (two) times daily. 60 tablet 0  . chlorhexidine (PERIDEX) 0.12 % solution Use as directed 15 mLs in the mouth or throat 2 (two) times daily. 473 mL 3  . Cholecalciferol (VITAMIN D3) 2000 units capsule Take 2,000 Units by mouth daily.    . diclofenac sodium (VOLTAREN) 1 % GEL Apply 2 g topically 4 (four) times daily as needed (joint pain).   11  . docusate sodium (COLACE) 100 MG capsule Take 100 mg by mouth 2 (two) times daily.    . fexofenadine (ALLEGRA) 180 MG tablet Take 180 mg by mouth daily.    . fluticasone (FLONASE) 50 MCG/ACT nasal spray Place 1 spray into both nostrils 2 (two) times a day.     . gabapentin (NEURONTIN) 100 MG capsule Take 1 capsule (100 mg total) by mouth at bedtime. 90 capsule 0  . HYDROcodone-acetaminophen (NORCO/VICODIN) 5-325 MG tablet Take 1 tablet by mouth every 4 (four) hours as needed for moderate pain. 8 tablet 0  . ipratropium (ATROVENT) 0.03 % nasal spray     . ketorolac (ACULAR) 0.4 % SOLN Place 1 drop into both eyes daily.     Marland Kitchen lidocaine-prilocaine (EMLA) cream Apply 1  application topically as needed. 30 g 6  . loperamide (IMODIUM) 2 MG capsule Take 1 capsule (2 mg total) by mouth See admin instructions. With onset of loose stool, take '4mg'$  followed by '2mg'$  every 2 hours,  Maximum: 16 mg/day 120 capsule 1  . magic mouthwash w/lidocaine SOLN Take 5 mLs by mouth 4 (four) times daily as needed for mouth pain. Sig: Swish/Swallow 5-10 ml four times a day as needed 480 mL 3  . Multiple Vitamins-Minerals (ONE-A-DAY WOMENS 50 PLUS PO) Take 1 tablet by mouth daily.     . ondansetron (ZOFRAN) 8 MG tablet Take 1 tablet (8 mg total) by mouth every 8 (eight) hours as needed for nausea, vomiting or refractory nausea / vomiting. 90 tablet 1  . potassium chloride SA (K-DUR) 20  MEQ tablet Take 20 mEq by mouth daily.    . prednisoLONE acetate (PRED FORTE) 1 % ophthalmic suspension Place 1 drop into both eyes 2 (two) times a day.   1  . prochlorperazine (COMPAZINE) 10 MG tablet Take 1 tablet (10 mg total) by mouth every 6 (six) hours as needed (Nausea or vomiting). 30 tablet 1  . simvastatin (ZOCOR) 40 MG tablet Take 40 mg by mouth at bedtime.     . triamterene-hydrochlorothiazide (DYAZIDE) 37.5-25 MG capsule Take 1 capsule by mouth daily.     Verneita Griffes Bark POWD Take by mouth.    . FIBER ADULT GUMMIES PO Take 1 Dose by mouth daily.    . Lutein 40 MG CAPS Take 40 mg by mouth daily.     . Lutein-Bilberry (BILBERRY PLUS LUTEIN) 02-999 MCG-MG CAPS Take 1 capsule by mouth daily.     . pantoprazole (PROTONIX) 40 MG tablet Take 1 tablet (40 mg total) by mouth as needed. (Patient not taking: Reported on 02/20/2020) 30 tablet 0   No current facility-administered medications for this visit.     PHYSICAL EXAMINATION: ECOG PERFORMANCE STATUS: 1 - Symptomatic but completely ambulatory Vitals:   02/20/20 1013  BP: 119/77  Pulse: (!) 58  Resp: 18  Temp: (!) 96 F (35.6 C)   Filed Weights   02/20/20 1013  Weight: 175 lb 12.8 oz (79.7 kg)    Physical Exam Constitutional:       General: She is not in acute distress. HENT:     Head: Normocephalic and atraumatic.  Eyes:     General: No scleral icterus. Cardiovascular:     Rate and Rhythm: Normal rate and regular rhythm.     Heart sounds: Normal heart sounds.  Pulmonary:     Effort: Pulmonary effort is normal. No respiratory distress.     Breath sounds: No wheezing.  Abdominal:     General: Bowel sounds are normal. There is no distension.     Palpations: Abdomen is soft.  Musculoskeletal:        General: No deformity. Normal range of motion.     Cervical back: Normal range of motion and neck supple.  Skin:    General: Skin is warm and dry.     Findings: No erythema or rash.  Neurological:     Mental Status: She is alert and oriented to person, place, and time. Mental status is at baseline.     Cranial Nerves: No cranial nerve deficit.     Coordination: Coordination normal.  Psychiatric:        Mood and Affect: Mood normal.    Genital examination revealed no ulcer, rash, or bleeding.  CMP Latest Ref Rng & Units 02/18/2020  Glucose 70 - 99 mg/dL 103(H)  BUN 8 - 23 mg/dL 14  Creatinine 0.44 - 1.00 mg/dL 1.01(H)  Sodium 135 - 145 mmol/L 139  Potassium 3.5 - 5.1 mmol/L 3.7  Chloride 98 - 111 mmol/L 102  CO2 22 - 32 mmol/L 28  Calcium 8.9 - 10.3 mg/dL 9.3  Total Protein 6.5 - 8.1 g/dL 7.0  Total Bilirubin 0.3 - 1.2 mg/dL 0.6  Alkaline Phos 38 - 126 U/L 34(L)  AST 15 - 41 U/L 22  ALT 0 - 44 U/L 14   CBC Latest Ref Rng & Units 02/18/2020  WBC 4.0 - 10.5 K/uL 3.1(L)  Hemoglobin 12.0 - 15.0 g/dL 11.7(L)  Hematocrit 36.0 - 46.0 % 36.1  Platelets 150 - 400 K/uL 194  LABORATORY DATA:  I have reviewed the data as listed Lab Results  Component Value Date   WBC 3.1 (L) 02/18/2020   HGB 11.7 (L) 02/18/2020   HCT 36.1 02/18/2020   MCV 92.1 02/18/2020   PLT 194 02/18/2020   Recent Labs    01/05/20 0928 01/05/20 0928 01/13/20 0949 01/13/20 0949 01/14/20 0724 01/29/20 1038 02/18/20 1339  NA 139    < > 139   < > 141 139 139  K 3.6   < > 3.6   < > 3.5 3.5 3.7  CL 103   < > 100   < > 105 98 102  CO2 26   < > 28   < > '28 29 28  '$ GLUCOSE 96   < > 86   < > 102* 91 103*  BUN 15   < > 11   < > '11 11 14  '$ CREATININE 0.91   < > 0.86   < > 0.81 0.87 1.01*  CALCIUM 9.6   < > 9.7   < > 9.2 9.6 9.3  GFRNONAA >60   < > >60   < > >60 >60 56*  GFRAA >60   < > >60   < > >60 >60 >60  PROT 7.4  --  7.4  --   --   --  7.0  ALBUMIN 4.3  --  4.3  --   --   --  4.0  AST 23  --  28  --   --   --  22  ALT 14  --  13  --   --   --  14  ALKPHOS 39  --  41  --   --   --  34*  BILITOT 0.5  --  0.6  --   --   --  0.6   < > = values in this interval not displayed.   Iron/TIBC/Ferritin/ %Sat    Component Value Date/Time   IRON 59 01/21/2020 1257   TIBC 286 01/21/2020 1257   FERRITIN 436 (H) 01/21/2020 1257   IRONPCTSAT 21 01/21/2020 1257     RADIOGRAPHIC STUDIES: I have personally reviewed the radiological images as listed and agreed with the findings in the report. CT Chest W Contrast  Result Date: 01/13/2020 CLINICAL DATA:  Rectal cancer. Restaging. EXAM: CT CHEST, ABDOMEN, AND PELVIS WITH CONTRAST TECHNIQUE: Multidetector CT imaging of the chest, abdomen and pelvis was performed following the standard protocol during bolus administration of intravenous contrast. CONTRAST:  21m OMNIPAQUE IOHEXOL 300 MG/ML  SOLN COMPARISON:  11/19/2018 FINDINGS: CT CHEST FINDINGS Cardiovascular: The heart size is normal. No substantial pericardial effusion. Coronary artery calcification is evident. Atherosclerotic calcification is noted in the wall of the thoracic aorta. Right Port-A-Cath tip is positioned at the SVC/RA junction. Nonocclusive acute pulmonary embolus identified in the right main pulmonary artery extending out into the interlobar pulmonary artery. There is a tiny string like pulmonary embolus in subsegmental arteries to the right lower lobe (image 36/series 2). Mediastinum/Nodes: No mediastinal lymphadenopathy.  There is no hilar lymphadenopathy. The esophagus has normal imaging features. There is no axillary lymphadenopathy. Lungs/Pleura: No suspicious pulmonary nodule or mass. No consolidation. No effusion. Minimal atelectasis noted posterior right base. Musculoskeletal: No worrisome lytic or sclerotic osseous abnormality. CT ABDOMEN PELVIS FINDINGS Hepatobiliary: No suspicious focal abnormality within the liver parenchyma. Gallbladder surgically absent. No intrahepatic or extrahepatic biliary dilation. Pancreas: No focal mass lesion. No dilatation of the main duct. No intraparenchymal cyst.  No peripancreatic edema. Spleen: No splenomegaly. No focal mass lesion. Adrenals/Urinary Tract: No adrenal nodule or mass. Bilateral renal cysts again noted. There are tiny hypodensities in both kidneys that are too small to characterize, similar to prior. No evidence for hydroureter. The urinary bladder appears normal for the degree of distention. Stomach/Bowel: Stomach is unremarkable. No gastric wall thickening. No evidence of outlet obstruction. Duodenum is normally positioned as is the ligament of Treitz. No small bowel wall thickening. No small bowel dilatation. The appendix is normal. Sigmoid end colostomy noted. Vascular/Lymphatic: There is abdominal aortic atherosclerosis without aneurysm. There is no gastrohepatic or hepatoduodenal ligament lymphadenopathy. No retroperitoneal or mesenteric lymphadenopathy. No pelvic sidewall lymphadenopathy. Laminar flow of un opacified blood identified in both common femoral veins. Reproductive: Uterus surgically absent. There is no adnexal mass. Skin thickening in the region the vaginal introitus may be related to prior surgery/radiation. Other: No intraperitoneal free fluid. There is some trace presacral soft tissue likely treatment related. Musculoskeletal: No worrisome lytic or sclerotic osseous abnormality. IMPRESSION: 1. No definite evidence for metastatic disease in the chest,  abdomen, or pelvis. This is the patient's first postsurgical study in our system and will now establish baseline for follow-up imaging. 2. Nonocclusive acute pulmonary embolus in the right main pulmonary artery extending out into the interlobar pulmonary artery. Tiny string like pulmonary embolus identified in subsegmental arteries to the right lower lobe. 3. Trace presacral soft tissue density is probably treatment related. Attention on follow-up recommended. 4. Aortic Atherosclerosis (ICD10-I70.0). Critical Value/emergent results were called by telephone at the time of interpretation on 01/13/2020 at 9:13 am to provider [facility] , who verbally acknowledged these results. After this conversation, I discussed the findings of today's study with the patient and her daughter. The patient was in no acute distress and was not short of breath. They will proceed directly to the [facility] hospital emergency department after leaving the imaging center. We will contact triage at the emergency department and alert them to the patient's arrival. Electronically Signed   By: Misty Stanley M.D.   On: 01/13/2020 09:14   CT Abdomen Pelvis W Contrast  Result Date: 01/13/2020 CLINICAL DATA:  Rectal cancer. Restaging. EXAM: CT CHEST, ABDOMEN, AND PELVIS WITH CONTRAST TECHNIQUE: Multidetector CT imaging of the chest, abdomen and pelvis was performed following the standard protocol during bolus administration of intravenous contrast. CONTRAST:  41m OMNIPAQUE IOHEXOL 300 MG/ML  SOLN COMPARISON:  11/19/2018 FINDINGS: CT CHEST FINDINGS Cardiovascular: The heart size is normal. No substantial pericardial effusion. Coronary artery calcification is evident. Atherosclerotic calcification is noted in the wall of the thoracic aorta. Right Port-A-Cath tip is positioned at the SVC/RA junction. Nonocclusive acute pulmonary embolus identified in the right main pulmonary artery extending out into the interlobar pulmonary artery. There is a  tiny string like pulmonary embolus in subsegmental arteries to the right lower lobe (image 36/series 2). Mediastinum/Nodes: No mediastinal lymphadenopathy. There is no hilar lymphadenopathy. The esophagus has normal imaging features. There is no axillary lymphadenopathy. Lungs/Pleura: No suspicious pulmonary nodule or mass. No consolidation. No effusion. Minimal atelectasis noted posterior right base. Musculoskeletal: No worrisome lytic or sclerotic osseous abnormality. CT ABDOMEN PELVIS FINDINGS Hepatobiliary: No suspicious focal abnormality within the liver parenchyma. Gallbladder surgically absent. No intrahepatic or extrahepatic biliary dilation. Pancreas: No focal mass lesion. No dilatation of the main duct. No intraparenchymal cyst. No peripancreatic edema. Spleen: No splenomegaly. No focal mass lesion. Adrenals/Urinary Tract: No adrenal nodule or mass. Bilateral renal cysts again noted. There are  tiny hypodensities in both kidneys that are too small to characterize, similar to prior. No evidence for hydroureter. The urinary bladder appears normal for the degree of distention. Stomach/Bowel: Stomach is unremarkable. No gastric wall thickening. No evidence of outlet obstruction. Duodenum is normally positioned as is the ligament of Treitz. No small bowel wall thickening. No small bowel dilatation. The appendix is normal. Sigmoid end colostomy noted. Vascular/Lymphatic: There is abdominal aortic atherosclerosis without aneurysm. There is no gastrohepatic or hepatoduodenal ligament lymphadenopathy. No retroperitoneal or mesenteric lymphadenopathy. No pelvic sidewall lymphadenopathy. Laminar flow of un opacified blood identified in both common femoral veins. Reproductive: Uterus surgically absent. There is no adnexal mass. Skin thickening in the region the vaginal introitus may be related to prior surgery/radiation. Other: No intraperitoneal free fluid. There is some trace presacral soft tissue likely treatment  related. Musculoskeletal: No worrisome lytic or sclerotic osseous abnormality. IMPRESSION: 1. No definite evidence for metastatic disease in the chest, abdomen, or pelvis. This is the patient's first postsurgical study in our system and will now establish baseline for follow-up imaging. 2. Nonocclusive acute pulmonary embolus in the right main pulmonary artery extending out into the interlobar pulmonary artery. Tiny string like pulmonary embolus identified in subsegmental arteries to the right lower lobe. 3. Trace presacral soft tissue density is probably treatment related. Attention on follow-up recommended. 4. Aortic Atherosclerosis (ICD10-I70.0). Critical Value/emergent results were called by telephone at the time of interpretation on 01/13/2020 at 9:13 am to provider [facility] , who verbally acknowledged these results. After this conversation, I discussed the findings of today's study with the patient and her daughter. The patient was in no acute distress and was not short of breath. They will proceed directly to the [facility] hospital emergency department after leaving the imaging center. We will contact triage at the emergency department and alert them to the patient's arrival. Electronically Signed   By: Misty Stanley M.D.   On: 01/13/2020 09:14   US Venous Img Lower Bilateral (DVT)  Result Date: 01/13/2020 CLINICAL DATA:  Acute pulmonary embolus. EXAM: BILATERAL LOWER EXTREMITY VENOUS DOPPLER ULTRASOUND TECHNIQUE: Gray-scale sonography with compression, as well as color and duplex ultrasound, were performed to evaluate the deep venous system(s) from the level of the common femoral vein through the popliteal and proximal calf veins. COMPARISON:  Chest CT earlier today, no prior lower extremity duplex. FINDINGS: VENOUS Right lower extremity: Normal compressibility of the common femoral, superficial femoral, and popliteal veins, as well as the visualized calf veins. Doppler waveforms show normal  direction of venous flow, normal respiratory phasicity and response to augmentation. There is nonocclusive thrombus in the profundus femoral vein. No other filling defects to suggest DVT on grayscale or color Doppler imaging. Left lower extremity: There is nonocclusive thrombus throughout the left lower extremity veins including the common femoral, profundus femoral, femoral, popliteal, and posterior tibial veins. Vessels are partially compressible. No thrombus in the peroneal vein. OTHER None. Limitations: none IMPRESSION: 1. Positive for DVT throughout the left lower extremity veins with nonocclusive thrombus extending from the common femoral through the posterior tibial veins. 2. Nonocclusive thrombus in the right profundus femoral vein. No other DVT in the right lower extremity. These results will be called to the ordering clinician or representative by the Radiologist Assistant, and communication documented in the PACS or [employer]. Electronically Signed   By: Keith Rake M.D.   On: 01/13/2020 17:28   ECHOCARDIOGRAM COMPLETE  Result Date: 01/14/2020    ECHOCARDIOGRAM REPORT  Patient Name:   Jacqueline Yoder Date of Exam: 01/13/2020 Medical Rec #:  [id number]      Height:       65.0 in Accession #:    [id number]     Weight:       172.0 lb Date of Birth:  [birth year]      BSA:          1.855 m Patient Age:    74 years       BP:           129/68 mmHg Patient Gender: F              HR:           79 bpm. Exam Location:  [facility] Procedure: 2D Echo, Cardiac Doppler and Color Doppler Indications:     I26.99 Pulmonary Embolus  History:         Patient has no prior history of Echocardiogram examinations.                  Risk Factors:Hypertension and Dyslipidemia. Lower extremity                  edema.  Sonographer:     Wilford Sports Rodgers-Jones Referring Phys:  Unknown Foley NIU Diagnosing Phys: Nelva Bush MD IMPRESSIONS  1. Left ventricular ejection fraction, by estimation, is 60 to 65%. The left ventricle has  normal function. The left ventricle has no regional wall motion abnormalities. There is mild left ventricular hypertrophy. Left ventricular diastolic parameters are consistent with Grade I diastolic dysfunction (impaired relaxation).  2. Right ventricular systolic function is normal. The right ventricular size is normal. Tricuspid regurgitation signal is inadequate for assessing PA pressure.  3. The mitral valve is degenerative. Trivial mitral valve regurgitation. No evidence of mitral stenosis.  4. The aortic valve is tricuspid. Aortic valve regurgitation is not visualized. No aortic stenosis is present. FINDINGS  Left Ventricle: Left ventricular ejection fraction, by estimation, is 60 to 65%. The left ventricle has normal function. The left ventricle has no regional wall motion abnormalities. The left ventricular internal cavity size was normal in size. There is  mild left ventricular hypertrophy. Left ventricular diastolic parameters are consistent with Grade I diastolic dysfunction (impaired relaxation). Right Ventricle: The right ventricular size is normal. No increase in right ventricular wall thickness. Right ventricular systolic function is normal. Tricuspid regurgitation signal is inadequate for assessing PA pressure. Left Atrium: Left atrial size was normal in size. Right Atrium: Right atrial size was normal in size. Pericardium: There is no evidence of pericardial effusion. Mitral Valve: The mitral valve is degenerative in appearance. There is mild thickening of the mitral valve leaflet(s). Mild mitral annular calcification. Trivial mitral valve regurgitation. No evidence of mitral valve stenosis. Tricuspid Valve: The tricuspid valve is normal in structure. Tricuspid valve regurgitation is trivial. Aortic Valve: The aortic valve is tricuspid. Aortic valve regurgitation is not visualized. No aortic stenosis is present. Pulmonic Valve: The pulmonic valve was not well visualized. Pulmonic valve regurgitation  is not visualized. No evidence of pulmonic stenosis. Aorta: The aortic root is normal in size and structure. Pulmonary Artery: The pulmonary artery is of normal size. Venous: The inferior vena cava was not well visualized. IAS/Shunts: The interatrial septum was not well visualized. Additional Comments: A venous catheter is visualized in the right atrium.  LEFT VENTRICLE PLAX 2D LVIDd:         4.10 cm Diastology LVIDs:  2.67 cm LV e' lateral:   8.05 cm/s LV PW:         1.11 cm LV E/e' lateral: 10.7 LV IVS:        1.15 cm LV e' medial:    5.87 cm/s                        LV E/e' medial:  14.6  RIGHT VENTRICLE RV Basal diam:  4.14 cm RV S prime:     17.20 cm/s TAPSE (M-mode): 2.0 cm LEFT ATRIUM             Index       RIGHT ATRIUM           Index LA diam:        4.40 cm 2.37 cm/m  RA Area:     13.40 cm LA Vol (A2C):   33.0 ml 17.79 ml/m RA Volume:   36.20 ml  19.51 ml/m LA Vol (A4C):   47.1 ml 25.39 ml/m LA Biplane Vol: 42.2 ml 22.75 ml/m   AORTA Ao Root diam: 2.90 cm MITRAL VALVE MV Area (PHT): 2.62 cm MV Decel Time: 289 msec MV E velocity: 85.90 cm/s MV A velocity: 109.00 cm/s MV E/A ratio:  0.79 Harrell Gave End MD Electronically signed by Nelva Bush MD Signature Date/Time: 01/14/2020/6:41:59 AM    Final     CT Chest W Contrast  Result Date: 01/13/2020 CLINICAL DATA:  Rectal cancer. Restaging. EXAM: CT CHEST, ABDOMEN, AND PELVIS WITH CONTRAST TECHNIQUE: Multidetector CT imaging of the chest, abdomen and pelvis was performed following the standard protocol during bolus administration of intravenous contrast. CONTRAST:  54m OMNIPAQUE IOHEXOL 300 MG/ML  SOLN COMPARISON:  11/19/2018 FINDINGS: CT CHEST FINDINGS Cardiovascular: The heart size is normal. No substantial pericardial effusion. Coronary artery calcification is evident. Atherosclerotic calcification is noted in the wall of the thoracic aorta. Right Port-A-Cath tip is positioned at the SVC/RA junction. Nonocclusive acute pulmonary embolus  identified in the right main pulmonary artery extending out into the interlobar pulmonary artery. There is a tiny string like pulmonary embolus in subsegmental arteries to the right lower lobe (image 36/series 2). Mediastinum/Nodes: No mediastinal lymphadenopathy. There is no hilar lymphadenopathy. The esophagus has normal imaging features. There is no axillary lymphadenopathy. Lungs/Pleura: No suspicious pulmonary nodule or mass. No consolidation. No effusion. Minimal atelectasis noted posterior right base. Musculoskeletal: No worrisome lytic or sclerotic osseous abnormality. CT ABDOMEN PELVIS FINDINGS Hepatobiliary: No suspicious focal abnormality within the liver parenchyma. Gallbladder surgically absent. No intrahepatic or extrahepatic biliary dilation. Pancreas: No focal mass lesion. No dilatation of the main duct. No intraparenchymal cyst. No peripancreatic edema. Spleen: No splenomegaly. No focal mass lesion. Adrenals/Urinary Tract: No adrenal nodule or mass. Bilateral renal cysts again noted. There are tiny hypodensities in both kidneys that are too small to characterize, similar to prior. No evidence for hydroureter. The urinary bladder appears normal for the degree of distention. Stomach/Bowel: Stomach is unremarkable. No gastric wall thickening. No evidence of outlet obstruction. Duodenum is normally positioned as is the ligament of Treitz. No small bowel wall thickening. No small bowel dilatation. The appendix is normal. Sigmoid end colostomy noted. Vascular/Lymphatic: There is abdominal aortic atherosclerosis without aneurysm. There is no gastrohepatic or hepatoduodenal ligament lymphadenopathy. No retroperitoneal or mesenteric lymphadenopathy. No pelvic sidewall lymphadenopathy. Laminar flow of un opacified blood identified in both common femoral veins. Reproductive: Uterus surgically absent. There is no adnexal mass. Skin thickening in the region the vaginal introitus  may be related to prior  surgery/radiation. Other: No intraperitoneal free fluid. There is some trace presacral soft tissue likely treatment related. Musculoskeletal: No worrisome lytic or sclerotic osseous abnormality. IMPRESSION: 1. No definite evidence for metastatic disease in the chest, abdomen, or pelvis. This is the patient's first postsurgical study in our system and will now establish baseline for follow-up imaging. 2. Nonocclusive acute pulmonary embolus in the right main pulmonary artery extending out into the interlobar pulmonary artery. Tiny string like pulmonary embolus identified in subsegmental arteries to the right lower lobe. 3. Trace presacral soft tissue density is probably treatment related. Attention on follow-up recommended. 4. Aortic Atherosclerosis (ICD10-I70.0). Critical Value/emergent results were called by telephone at the time of interpretation on 01/13/2020 at 9:13 am to provider [facility] , who verbally acknowledged these results. After this conversation, I discussed the findings of today's study with the patient and her daughter. The patient was in no acute distress and was not short of breath. They will proceed directly to the [facility] hospital emergency department after leaving the imaging center. We will contact triage at the emergency department and alert them to the patient's arrival. Electronically Signed   By: Misty Stanley M.D.   On: 01/13/2020 09:14   CT Abdomen Pelvis W Contrast  Result Date: 01/13/2020 CLINICAL DATA:  Rectal cancer. Restaging. EXAM: CT CHEST, ABDOMEN, AND PELVIS WITH CONTRAST TECHNIQUE: Multidetector CT imaging of the chest, abdomen and pelvis was performed following the standard protocol during bolus administration of intravenous contrast. CONTRAST:  41m OMNIPAQUE IOHEXOL 300 MG/ML  SOLN COMPARISON:  11/19/2018 FINDINGS: CT CHEST FINDINGS Cardiovascular: The heart size is normal. No substantial pericardial effusion. Coronary artery calcification is evident.  Atherosclerotic calcification is noted in the wall of the thoracic aorta. Right Port-A-Cath tip is positioned at the SVC/RA junction. Nonocclusive acute pulmonary embolus identified in the right main pulmonary artery extending out into the interlobar pulmonary artery. There is a tiny string like pulmonary embolus in subsegmental arteries to the right lower lobe (image 36/series 2). Mediastinum/Nodes: No mediastinal lymphadenopathy. There is no hilar lymphadenopathy. The esophagus has normal imaging features. There is no axillary lymphadenopathy. Lungs/Pleura: No suspicious pulmonary nodule or mass. No consolidation. No effusion. Minimal atelectasis noted posterior right base. Musculoskeletal: No worrisome lytic or sclerotic osseous abnormality. CT ABDOMEN PELVIS FINDINGS Hepatobiliary: No suspicious focal abnormality within the liver parenchyma. Gallbladder surgically absent. No intrahepatic or extrahepatic biliary dilation. Pancreas: No focal mass lesion. No dilatation of the main duct. No intraparenchymal cyst. No peripancreatic edema. Spleen: No splenomegaly. No focal mass lesion. Adrenals/Urinary Tract: No adrenal nodule or mass. Bilateral renal cysts again noted. There are tiny hypodensities in both kidneys that are too small to characterize, similar to prior. No evidence for hydroureter. The urinary bladder appears normal for the degree of distention. Stomach/Bowel: Stomach is unremarkable. No gastric wall thickening. No evidence of outlet obstruction. Duodenum is normally positioned as is the ligament of Treitz. No small bowel wall thickening. No small bowel dilatation. The appendix is normal. Sigmoid end colostomy noted. Vascular/Lymphatic: There is abdominal aortic atherosclerosis without aneurysm. There is no gastrohepatic or hepatoduodenal ligament lymphadenopathy. No retroperitoneal or mesenteric lymphadenopathy. No pelvic sidewall lymphadenopathy. Laminar flow of un opacified blood identified in both  common femoral veins. Reproductive: Uterus surgically absent. There is no adnexal mass. Skin thickening in the region the vaginal introitus may be related to prior surgery/radiation. Other: No intraperitoneal free fluid. There is some trace presacral soft tissue likely treatment related. Musculoskeletal: No  worrisome lytic or sclerotic osseous abnormality. IMPRESSION: 1. No definite evidence for metastatic disease in the chest, abdomen, or pelvis. This is the patient's first postsurgical study in our system and will now establish baseline for follow-up imaging. 2. Nonocclusive acute pulmonary embolus in the right main pulmonary artery extending out into the interlobar pulmonary artery. Tiny string like pulmonary embolus identified in subsegmental arteries to the right lower lobe. 3. Trace presacral soft tissue density is probably treatment related. Attention on follow-up recommended. 4. Aortic Atherosclerosis (ICD10-I70.0). Critical Value/emergent results were called by telephone at the time of interpretation on 01/13/2020 at 9:13 am to provider [facility] , who verbally acknowledged these results. After this conversation, I discussed the findings of today's study with the patient and her daughter. The patient was in no acute distress and was not short of breath. They will proceed directly to the [facility] hospital emergency department after leaving the imaging center. We will contact triage at the emergency department and alert them to the patient's arrival. Electronically Signed   By: Misty Stanley M.D.   On: 01/13/2020 09:14   US Venous Img Lower Bilateral (DVT)  Result Date: 01/13/2020 CLINICAL DATA:  Acute pulmonary embolus. EXAM: BILATERAL LOWER EXTREMITY VENOUS DOPPLER ULTRASOUND TECHNIQUE: Gray-scale sonography with compression, as well as color and duplex ultrasound, were performed to evaluate the deep venous system(s) from the level of the common femoral vein through the popliteal and proximal calf  veins. COMPARISON:  Chest CT earlier today, no prior lower extremity duplex. FINDINGS: VENOUS Right lower extremity: Normal compressibility of the common femoral, superficial femoral, and popliteal veins, as well as the visualized calf veins. Doppler waveforms show normal direction of venous flow, normal respiratory phasicity and response to augmentation. There is nonocclusive thrombus in the profundus femoral vein. No other filling defects to suggest DVT on grayscale or color Doppler imaging. Left lower extremity: There is nonocclusive thrombus throughout the left lower extremity veins including the common femoral, profundus femoral, femoral, popliteal, and posterior tibial veins. Vessels are partially compressible. No thrombus in the peroneal vein. OTHER None. Limitations: none IMPRESSION: 1. Positive for DVT throughout the left lower extremity veins with nonocclusive thrombus extending from the common femoral through the posterior tibial veins. 2. Nonocclusive thrombus in the right profundus femoral vein. No other DVT in the right lower extremity. These results will be called to the ordering clinician or representative by the Radiologist Assistant, and communication documented in the PACS or [employer]. Electronically Signed   By: Keith Rake M.D.   On: 01/13/2020 17:28   ECHOCARDIOGRAM COMPLETE  Result Date: 01/14/2020    ECHOCARDIOGRAM REPORT   Patient Name:   Jacqueline Yoder Date of Exam: 01/13/2020 Medical Rec #:  [id number]      Height:       65.0 in Accession #:    [id number]     Weight:       172.0 lb Date of Birth:  Mar 02, 1946      BSA:          1.855 m Patient Age:    71 years       BP:           129/68 mmHg Patient Gender: F              HR:           79 bpm. Exam Location:  [facility] Procedure: 2D Echo, Cardiac Doppler and Color Doppler Indications:     I26.99 Pulmonary Embolus  History:         Patient has no prior history of Echocardiogram examinations.                  Risk  Factors:Hypertension and Dyslipidemia. Lower extremity                  edema.  Sonographer:     Wilford Sports Rodgers-Jones Referring Phys:  Unknown Foley NIU Diagnosing Phys: Nelva Bush MD IMPRESSIONS  1. Left ventricular ejection fraction, by estimation, is 60 to 65%. The left ventricle has normal function. The left ventricle has no regional wall motion abnormalities. There is mild left ventricular hypertrophy. Left ventricular diastolic parameters are consistent with Grade I diastolic dysfunction (impaired relaxation).  2. Right ventricular systolic function is normal. The right ventricular size is normal. Tricuspid regurgitation signal is inadequate for assessing PA pressure.  3. The mitral valve is degenerative. Trivial mitral valve regurgitation. No evidence of mitral stenosis.  4. The aortic valve is tricuspid. Aortic valve regurgitation is not visualized. No aortic stenosis is present. FINDINGS  Left Ventricle: Left ventricular ejection fraction, by estimation, is 60 to 65%. The left ventricle has normal function. The left ventricle has no regional wall motion abnormalities. The left ventricular internal cavity size was normal in size. There is  mild left ventricular hypertrophy. Left ventricular diastolic parameters are consistent with Grade I diastolic dysfunction (impaired relaxation). Right Ventricle: The right ventricular size is normal. No increase in right ventricular wall thickness. Right ventricular systolic function is normal. Tricuspid regurgitation signal is inadequate for assessing PA pressure. Left Atrium: Left atrial size was normal in size. Right Atrium: Right atrial size was normal in size. Pericardium: There is no evidence of pericardial effusion. Mitral Valve: The mitral valve is degenerative in appearance. There is mild thickening of the mitral valve leaflet(s). Mild mitral annular calcification. Trivial mitral valve regurgitation. No evidence of mitral valve stenosis. Tricuspid Valve: The  tricuspid valve is normal in structure. Tricuspid valve regurgitation is trivial. Aortic Valve: The aortic valve is tricuspid. Aortic valve regurgitation is not visualized. No aortic stenosis is present. Pulmonic Valve: The pulmonic valve was not well visualized. Pulmonic valve regurgitation is not visualized. No evidence of pulmonic stenosis. Aorta: The aortic root is normal in size and structure. Pulmonary Artery: The pulmonary artery is of normal size. Venous: The inferior vena cava was not well visualized. IAS/Shunts: The interatrial septum was not well visualized. Additional Comments: A venous catheter is visualized in the right atrium.  LEFT VENTRICLE PLAX 2D LVIDd:         4.10 cm Diastology LVIDs:         2.67 cm LV e' lateral:   8.05 cm/s LV PW:         1.11 cm LV E/e' lateral: 10.7 LV IVS:        1.15 cm LV e' medial:    5.87 cm/s                        LV E/e' medial:  14.6  RIGHT VENTRICLE RV Basal diam:  4.14 cm RV S prime:     17.20 cm/s TAPSE (M-mode): 2.0 cm LEFT ATRIUM             Index       RIGHT ATRIUM           Index LA diam:        4.40 cm 2.37 cm/m  RA Area:  13.40 cm LA Vol (A2C):   33.0 ml 17.79 ml/m RA Volume:   36.20 ml  19.51 ml/m LA Vol (A4C):   47.1 ml 25.39 ml/m LA Biplane Vol: 42.2 ml 22.75 ml/m   AORTA Ao Root diam: 2.90 cm MITRAL VALVE MV Area (PHT): 2.62 cm MV Decel Time: 289 msec MV E velocity: 85.90 cm/s MV A velocity: 109.00 cm/s MV E/A ratio:  0.79 Harrell Gave End MD Electronically signed by Nelva Bush MD Signature Date/Time: 01/14/2020/6:41:59 AM    Final     ASSESSMENT & PLAN:  1. Rectal cancer ([place])   2. Current use of long term anticoagulation   3. Normocytic anemia   4. Neutropenia, unspecified type [facility])   Cancer Staging Rectal cancer [facility]) Staging form: Colon and Rectum, AJCC 8th Edition - Clinical stage from 01/23/2019: Stage IIIC (cT4b, cN1a, cM0) - Signed by Earlie Server, MD on 01/23/2019 - Pathologic stage from 10/06/2019: Stage IIC (ypT4b, pN0,  cM0) - Signed by Earlie Server, MD on 10/06/2019  #Rectal cancer, Labs reviewed and discussed with patient. Progressive rising CEA.  Suspect recurrence/metastasis. I recommend to obtain CT chest abdomen pelvis with contrast for reevaluation.  #History of acute pulmonary embolism, acute bilateral proximal DVT, unprovoked Continue Eliquis 5 mg twice daily. #Normocytic anemia, hemoglobin slightly decreased.  Monitor. #Leukopenia, mild.  Total white count of 3.1.  Neutropenia 1.5. Follow-up planned to be determined.  Earlie Server, MD, PhD

## 2020-02-20 NOTE — Progress Notes (Signed)
Patient here for follow up. No new concerns voiced.  °

## 2020-02-23 ENCOUNTER — Other Ambulatory Visit: Payer: Self-pay | Admitting: Internal Medicine

## 2020-02-23 ENCOUNTER — Inpatient Hospital Stay: Payer: Medicare Other

## 2020-02-25 ENCOUNTER — Other Ambulatory Visit: Payer: Self-pay

## 2020-02-25 ENCOUNTER — Ambulatory Visit
Admission: RE | Admit: 2020-02-25 | Discharge: 2020-02-25 | Disposition: A | Discharge status: Home / Self Care | Payer: Medicare Other | Source: Ambulatory Visit | Attending: Oncology | Admitting: Oncology

## 2020-02-25 DIAGNOSIS — C20 Malignant neoplasm of rectum: Secondary | ICD-10-CM

## 2020-02-25 MED ORDER — IOHEXOL 300 MG/ML  SOLN
100.0000 mL | Freq: Once | INTRAMUSCULAR | Status: AC | PRN
  Administered 2020-02-25: 100 mL via INTRAVENOUS

## 2020-02-27 ENCOUNTER — Ambulatory Visit: Payer: Medicare Other | Admitting: Certified Nurse Midwife

## 2020-02-27 ENCOUNTER — Encounter: Payer: Self-pay | Admitting: Oncology

## 2020-03-01 ENCOUNTER — Other Ambulatory Visit: Payer: Self-pay | Admitting: Oncology

## 2020-03-01 ENCOUNTER — Other Ambulatory Visit: Payer: Self-pay

## 2020-03-01 ENCOUNTER — Telehealth: Payer: Self-pay

## 2020-03-01 DIAGNOSIS — C20 Malignant neoplasm of rectum: Secondary | ICD-10-CM

## 2020-03-01 NOTE — Telephone Encounter (Signed)
Please schedule MRI & MRCP (orders entered) to be done first available. Move appts in June to be after scans. Notify pt of appts please.   GI referral entered.

## 2020-03-01 NOTE — Telephone Encounter (Addendum)
Done.Marland Kitchen...  Pt has been sched for an MRI & MRCP as requested. Also her sched 04/05/20 24mth lab/MD appts has been moved up to RTC after scan per MD. Lab/MD appts is now  scheduled for 03/19/20 A detailed message was left on pt vmail making her aware of the scheduled appt location, dates and times. A reminder letter will be mailed out.

## 2020-03-01 NOTE — Telephone Encounter (Signed)
-----   Message from Earlie Server, MD sent at 03/01/2020  9:39 AM EDT ----- I talked to patient over the phone and communicated with her about CT scan. Despite CEA trending up, CT showed no definite evidence of rectal cancer recurrence. Please schedule patient to have MRI pelvis done for further evaluation.-Reason is rectal cancer, CEA is trending up. Also please make a referral to Dr. Nelda Bucks intrahepatic biliary duct distention evaluation.   Please move her appointment up from end of June to be after MRI.  Lab MD  thank you patient is aware about these recommendations.

## 2020-03-11 ENCOUNTER — Other Ambulatory Visit: Payer: Self-pay

## 2020-03-11 DIAGNOSIS — C20 Malignant neoplasm of rectum: Secondary | ICD-10-CM

## 2020-03-16 ENCOUNTER — Other Ambulatory Visit: Payer: Self-pay

## 2020-03-16 ENCOUNTER — Ambulatory Visit
Admission: RE | Admit: 2020-03-16 | Discharge: 2020-03-16 | Disposition: A | Discharge status: Home / Self Care | Payer: Medicare Other | Source: Ambulatory Visit | Attending: Oncology | Admitting: Oncology

## 2020-03-16 DIAGNOSIS — C20 Malignant neoplasm of rectum: Secondary | ICD-10-CM | POA: Insufficient documentation

## 2020-03-16 MED ORDER — GADOBUTROL 1 MMOL/ML IV SOLN
7.5000 mL | Freq: Once | INTRAVENOUS | Status: AC | PRN
  Administered 2020-03-16: 7.5 mL via INTRAVENOUS

## 2020-03-19 ENCOUNTER — Other Ambulatory Visit: Payer: Self-pay

## 2020-03-19 ENCOUNTER — Inpatient Hospital Stay (HOSPITAL_BASED_OUTPATIENT_CLINIC_OR_DEPARTMENT_OTHER): Payer: Medicare Other | Admitting: Oncology

## 2020-03-19 ENCOUNTER — Inpatient Hospital Stay: Payer: Medicare Other | Attending: Oncology

## 2020-03-19 ENCOUNTER — Encounter: Payer: Self-pay | Admitting: Oncology

## 2020-03-19 VITALS — BP 130/65 | HR 66 | Temp 97.2°F | Resp 20 | Wt 172.5 lb

## 2020-03-19 DIAGNOSIS — Z86718 Personal history of other venous thrombosis and embolism: Secondary | ICD-10-CM

## 2020-03-19 DIAGNOSIS — K219 Gastro-esophageal reflux disease without esophagitis: Secondary | ICD-10-CM | POA: Insufficient documentation

## 2020-03-19 DIAGNOSIS — I10 Essential (primary) hypertension: Secondary | ICD-10-CM | POA: Insufficient documentation

## 2020-03-19 DIAGNOSIS — C20 Malignant neoplasm of rectum: Secondary | ICD-10-CM | POA: Diagnosis not present

## 2020-03-19 DIAGNOSIS — Z86711 Personal history of pulmonary embolism: Secondary | ICD-10-CM

## 2020-03-19 DIAGNOSIS — M129 Arthropathy, unspecified: Secondary | ICD-10-CM | POA: Diagnosis not present

## 2020-03-19 DIAGNOSIS — Z7901 Long term (current) use of anticoagulants: Secondary | ICD-10-CM | POA: Diagnosis not present

## 2020-03-19 DIAGNOSIS — Z87891 Personal history of nicotine dependence: Secondary | ICD-10-CM | POA: Diagnosis not present

## 2020-03-19 DIAGNOSIS — Z79899 Other long term (current) drug therapy: Secondary | ICD-10-CM | POA: Diagnosis not present

## 2020-03-19 DIAGNOSIS — E7800 Pure hypercholesterolemia, unspecified: Secondary | ICD-10-CM | POA: Insufficient documentation

## 2020-03-19 LAB — CBC WITH DIFFERENTIAL/PLATELET
Abs Immature Granulocytes: 0.01 10*3/uL (ref 0.00–0.07)
Basophils Absolute: 0 10*3/uL (ref 0.0–0.1)
Basophils Relative: 1 %
Eosinophils Absolute: 0.1 10*3/uL (ref 0.0–0.5)
Eosinophils Relative: 3 %
HCT: 38.9 % (ref 36.0–46.0)
Hemoglobin: 12.7 g/dL (ref 12.0–15.0)
Immature Granulocytes: 0 %
Lymphocytes Relative: 34 %
Lymphs Abs: 1.2 10*3/uL (ref 0.7–4.0)
MCH: 30 pg (ref 26.0–34.0)
MCHC: 32.6 g/dL (ref 30.0–36.0)
MCV: 92 fL (ref 80.0–100.0)
Monocytes Absolute: 0.3 10*3/uL (ref 0.1–1.0)
Monocytes Relative: 10 %
Neutro Abs: 1.8 10*3/uL (ref 1.7–7.7)
Neutrophils Relative %: 52 %
Platelets: 207 10*3/uL (ref 150–400)
RBC: 4.23 MIL/uL (ref 3.87–5.11)
RDW: 14.1 % (ref 11.5–15.5)
WBC: 3.5 10*3/uL — ABNORMAL LOW (ref 4.0–10.5)
nRBC: 0 % (ref 0.0–0.2)

## 2020-03-19 LAB — COMPREHENSIVE METABOLIC PANEL
ALT: 16 U/L (ref 0–44)
AST: 25 U/L (ref 15–41)
Albumin: 4.4 g/dL (ref 3.5–5.0)
Alkaline Phosphatase: 37 U/L — ABNORMAL LOW (ref 38–126)
Anion gap: 9 (ref 5–15)
BUN: 12 mg/dL (ref 8–23)
CO2: 31 mmol/L (ref 22–32)
Calcium: 9.9 mg/dL (ref 8.9–10.3)
Chloride: 102 mmol/L (ref 98–111)
Creatinine, Ser: 1.01 mg/dL — ABNORMAL HIGH (ref 0.44–1.00)
GFR calc Af Amer: >60 mL/min (ref >60)
GFR calc non Af Amer: 56 mL/min — ABNORMAL LOW (ref >60)
Glucose, Bld: 95 mg/dL (ref 70–99)
Potassium: 4.1 mmol/L (ref 3.5–5.1)
Sodium: 142 mmol/L (ref 135–145)
Total Bilirubin: 0.8 mg/dL (ref 0.3–1.2)
Total Protein: 7.5 g/dL (ref 6.5–8.1)

## 2020-03-19 NOTE — Progress Notes (Signed)
Hematology/Oncology progress note [facility] Telephone:([phone] [phone]   Patient Care Team: Kirk Ruths, MD as PCP - General (Internal Medicine) Clent Jacks, RN as Registered Nurse  REFERRING PROVIDER: Dr.Secord CHIEF COMPLAINTS/REASON FOR VISIT:  Follow up for rectal cancer  HISTORY OF PRESENTING ILLNESS:  Jacqueline Yoder is a  74 y.o.  female with PMH listed below who was referred to me for evaluation of endometrial cancer.   Patient initially presented with complaints of postmenopausal bleeding on 08/16/2018.  History of was menopausal vaginal bleeding in 2016 which resulted in cervical polypectomy.  Pathology 02/04/2015 showed cervical polyp, consistent with benign endometrial polyp.  [name] after polypectomy due to anxiety associated with pelvic exams.  pelvic exam on 08/16/2018 reviewed cervical abnormality and from enlarged uterus. Seen by Dr. Marcelline Mates on 10/29/2018.  Endometrial biopsy and a Pap smear was performed. 10/29/2018 Pap smear showed adenocarcinoma, favor endometrial origin. 10/29/2018 endometrial biopsy showed endometrioid carcinoma, FIGO grade 1.  10/29/2018- TA & TV Ultrasound revealed: Anteverted uterus measuring 8.7 x 5.6 x 6.4 cm without evidence of focal masses.  The endometrium measuring 24.1 mm (thickened) and heterogeneous.  Right and left ovaries not visualized.  No adnexal masses identified.  No free fluid in cul-de-sac.  Patient was seen by Dr. Theora Gianotti in clinic on 11/13/2018.  Cervical exam reveals 2 cm exophytic irregular mass consistent with malignancy.   11/19/2018 CT chest abdomen pelvis with contrast showed thickened endometrium with some irregularity compatible with the provided diagnosis of endometrial malignancy.  There is a mildly prominent left inguinal node 1.4 cm.  Patient was seen by Dr. Fransisca Connors on 11/20/2018 and left groin lymph node biopsy was recommended.  11/26/2018 patient underwent  left inguinal lymph node biopsy. Pathology showed metastatic adenocarcinoma consistent with colorectal origin.  CDX 2+.  Case was discussed on tumor board.  Recommend colonoscopy for further evaluation.  Patient reports significant weight loss 30 pounds over the last year.  Chronic vaginal spotting. Change of bowel habits the past few months.  More constipated.  Family history positive for brother who has colon cancer prostate cancer.  patient has underwent colonoscopy on 12/03/2018 which reviewed a nonobstructing large mass in the rectum.  Also chronic fistula.  Mass was not circumferential.  This was biopsied with a cold forceps for histology.  Pathology came back hyperplastic polyp negative for dysplasia and malignancy. [name], patient underwent flex sigmoidoscopy on 12/06/2018 with rebiopsy of the rectal mass. This time biopsy results came back positive for invasive colorectal adenocarcinoma, moderately differentiated. Immunotherapy for nearly mismatch repair protein (MMR ) was performed.  There is no loss of MMR expression.  low probability of MSI high.   # Seen by [name] for evaluation of resectability for rectal cancer. In addition, she also had a second opinion with [facility] where her endometrial biopsy pathology was changed to  adenocarcinoma, consistent with colorectal primary.   [name]. She has home health that has been assisting with ostomy care  Patient was also evaluated by [facility] oncology.  Recommendation is to proceed with TNT with concurrent chemoradiation followed by neoadjuvant chemotherapy followed by surgical resection. Patient prefers to have treatment done locally with [facility].   # Oncology Treatment:  02/03/2019- 03/19/2019  concurrent Xeloda and radiation.  Xeloda dose 884m /m2 BID - rounded to 16557mBID- on days of radiation. 04/09/2019, started on FOLFOX with bolus 5-FU omitted.  07/16/2019  finished  8 cycles of FOLFOX.  # Patient has underwent 09/17/19 APR/posterior vaginectomy/TAH/BSO/VY-flap pT4b pN0 with close vaginal margin 0.2 mm.  Uterus and ovaries negative for malignancy. Patient reports bilateral lower extremity numbness and tingling, intermittent, left worse than right. She has lost a lot of weight since her APR surgery.   Surveillance plan Recommend history and physical/tumor marker monitoring every 3 to 6 months for the first 2 years after surgery and then every 6 months for a total of 5 years. Chest abdomen pelvis every 6 to 12 months for total 5 years. Colonoscopy 1 year after surgery, if no advanced adenoma, repeat in 3 years and then 5 years.  #Family history with half brother having's history of colon cancer prostate cancer.  Personal history of colorectal cancer.  Patient has not decided if she wants genetic testing.    INTERVAL HISTORY Jacqueline Yoder is a 74 y.o. female who has above history reviewed by me presents for follow-up of rectal cancer. Patient has no new complaints.  on Eliquis 5 mg twice daily for history of PE in the bilateral lower extremity DVT.  No bleeding events. She has no new complaints.    Review of Systems  Constitutional: Negative for appetite change, chills, fatigue, fever and unexpected weight change.  HENT:   Negative for hearing loss and voice change.   Eyes: Negative for eye problems.  Respiratory: Negative for chest tightness and cough.   Cardiovascular: Negative for chest pain.  Gastrointestinal: Negative for abdominal distention, abdominal pain, blood in stool, constipation, diarrhea and nausea.       Colostomy bag  Endocrine: Negative for hot flashes.  Genitourinary: Negative for difficulty urinating and frequency.   Musculoskeletal: Negative for arthralgias.  Skin: Negative for itching and rash.  Neurological: Negative for extremity weakness and numbness.  Hematological: Negative for adenopathy.    Psychiatric/Behavioral: Negative for confusion.    MEDICAL HISTORY:  Past Medical History:  Diagnosis Date  . Arthritis   . Cancer ([place])    rectal  . GERD (gastroesophageal reflux disease)   . Hypercholesteremia   . Hypertension   . Hypertension   . Lower extremity edema   . Urinary incontinence     SURGICAL HISTORY: Past Surgical History:  Procedure Laterality Date  . CHOLECYSTECTOMY  1971  . COLONOSCOPY WITH PROPOFOL N/A 12/03/2018   Procedure: COLONOSCOPY WITH PROPOFOL;  Surgeon: Lucilla Lame, MD;  Location: [facility] ENDOSCOPY;  Service: Endoscopy;  Laterality: N/A;  . FLEXIBLE SIGMOIDOSCOPY N/A 12/06/2018   Procedure: FLEXIBLE SIGMOIDOSCOPY;  Surgeon: [name], MD;  Location: [facility] ENDOSCOPY;  Service: Endoscopy;  Laterality: N/A;  . LAPAROSCOPIC COLOSTOMY  01/06/2019  . PORTACATH PLACEMENT N/A 04/03/2019   Procedure: INSERTION PORT-A-CATH;  Surgeon: Jules Husbands, MD;  Location: [facility] ORS;  Service: General;  Laterality: N/A;    SOCIAL HISTORY: Social History   Socioeconomic History  . Marital status: Married    Spouse name: Not on file  . Number of children: Not on file  . Years of education: Not on file  . Highest education level: Not on file  Occupational History  . Not on file  Tobacco Use  . Smoking status: Former Smoker    Quit date: 12/02/1977    Years since quitting: 42.3  . Smokeless tobacco: Never Used  Substance and Sexual Activity  . Alcohol use: Never  . Drug use: Never  . Sexual activity: Not Currently    Birth control/protection: None  Other Topics Concern  . Not on file  Social History Narrative   Lives with daughter   Social Determinants of Health   Financial Resource Strain:   . Difficulty of Paying Living Expenses:   Food Insecurity:   . Worried About Charity fundraiser in the Last Year:   . Arboriculturist in the Last Year:   Transportation Needs:   . Film/video editor (Medical):   Marland Kitchen Lack of Transportation (Non-Medical):    Physical Activity:   . Days of Exercise per Week:   . Minutes of Exercise per Session:   Stress:   . Feeling of Stress :   Social Connections:   . Frequency of Communication with Friends and Family:   . Frequency of Social Gatherings with Friends and Family:   . Attends Religious Services:   . Active Member of Clubs or Organizations:   . Attends Archivist Meetings:   Marland Kitchen Marital Status:   Intimate Partner Violence:   . Fear of Current or Ex-Partner:   . Emotionally Abused:   Marland Kitchen Physically Abused:   . Sexually Abused:     FAMILY HISTORY: Family History  Problem Relation Age of Onset  . Colon cancer Brother   . Prostate cancer Brother   . Hypertension Mother   . Stroke Mother   . Kidney failure Father   . Breast cancer Neg Hx   . Ovarian cancer Neg Hx     ALLERGIES:  is allergic to sulfamethoxazole-trimethoprim.  MEDICATIONS:  Current Outpatient Medications  Medication Sig Dispense Refill  . apixaban (ELIQUIS) 5 MG TABS tablet Take 1 tablet (5 mg total) by mouth 2 (two) times daily. 60 tablet 0  . chlorhexidine (PERIDEX) 0.12 % solution Use as directed 15 mLs in the mouth or throat 2 (two) times daily. 473 mL 3  . Cholecalciferol (VITAMIN D3) 2000 units capsule Take 2,000 Units by mouth daily.    . diclofenac sodium (VOLTAREN) 1 % GEL Apply 2 g topically 4 (four) times daily as needed (joint pain).   11  . docusate sodium (COLACE) 100 MG capsule Take 100 mg by mouth 2 (two) times daily.    . fexofenadine (ALLEGRA) 180 MG tablet Take 180 mg by mouth daily.    Marland Kitchen FIBER ADULT GUMMIES PO Take 1 Dose by mouth daily.    . fluticasone (FLONASE) 50 MCG/ACT nasal spray Place 1 spray into both nostrils 2 (two) times a day.     . gabapentin (NEURONTIN) 100 MG capsule Take 1 capsule (100 mg total) by mouth at bedtime. 90 capsule 0  . HYDROcodone-acetaminophen (NORCO/VICODIN) 5-325 MG tablet Take 1 tablet by mouth every 4 (four) hours as needed for moderate pain. 8 tablet 0  .  ipratropium (ATROVENT) 0.03 % nasal spray     . ketorolac (ACULAR) 0.4 % SOLN Place 1 drop into both eyes daily.     Marland Kitchen lidocaine-prilocaine (EMLA) cream Apply 1 application topically as needed. 30 g 6  . loperamide (IMODIUM) 2 MG capsule Take 1 capsule (2 mg total) by mouth See admin instructions. With onset of loose stool, take '4mg'$  followed by '2mg'$  every 2 hours,  Maximum: 16 mg/day 120 capsule 1  . Lutein 40 MG CAPS Take 40 mg by mouth daily.     . Lutein-Bilberry (BILBERRY PLUS LUTEIN) 02-999 MCG-MG CAPS Take 1 capsule by mouth daily.     . magic mouthwash w/lidocaine SOLN Take 5 mLs by mouth 4 (four) times daily as needed for mouth pain. Sig: Swish/Swallow 5-10 ml four  times a day as needed 480 mL 3  . Multiple Vitamins-Minerals (ONE-A-DAY WOMENS 50 PLUS PO) Take 1 tablet by mouth daily.     . ondansetron (ZOFRAN) 8 MG tablet Take 1 tablet (8 mg total) by mouth every 8 (eight) hours as needed for nausea, vomiting or refractory nausea / vomiting. 90 tablet 1  . pantoprazole (PROTONIX) 40 MG tablet Take 1 tablet (40 mg total) by mouth as needed. 30 tablet 0  . potassium chloride SA (K-DUR) 20 MEQ tablet Take 20 mEq by mouth daily.    . prednisoLONE acetate (PRED FORTE) 1 % ophthalmic suspension Place 1 drop into both eyes 2 (two) times a day.   1  . prochlorperazine (COMPAZINE) 10 MG tablet Take 1 tablet (10 mg total) by mouth every 6 (six) hours as needed (Nausea or vomiting). 30 tablet 1  . simvastatin (ZOCOR) 40 MG tablet Take 40 mg by mouth at bedtime.     . triamterene-hydrochlorothiazide (DYAZIDE) 37.5-25 MG capsule Take 1 capsule by mouth daily.     Verneita Griffes Bark POWD Take by mouth.     No current facility-administered medications for this visit.     PHYSICAL EXAMINATION: ECOG PERFORMANCE STATUS: 1 - Symptomatic but completely ambulatory Vitals:   03/19/20 0852  BP: 130/65  Pulse: 66  Resp: 20  Temp: (!) 97.2 F (36.2 C)  SpO2: 100%   Filed Weights   03/19/20 0852  Weight:  172 lb 8 oz (78.2 kg)    Physical Exam Constitutional:      General: She is not in acute distress. HENT:     Head: Normocephalic and atraumatic.  Eyes:     General: No scleral icterus. Cardiovascular:     Rate and Rhythm: Normal rate and regular rhythm.     Heart sounds: Normal heart sounds.  Pulmonary:     Effort: Pulmonary effort is normal. No respiratory distress.     Breath sounds: No wheezing.  Abdominal:     General: Bowel sounds are normal. There is no distension.     Palpations: Abdomen is soft.  Musculoskeletal:        General: No deformity. Normal range of motion.     Cervical back: Normal range of motion and neck supple.  Skin:    General: Skin is warm and dry.     Findings: No erythema or rash.  Neurological:     Mental Status: She is alert and oriented to person, place, and time. Mental status is at baseline.     Cranial Nerves: No cranial nerve deficit.     Coordination: Coordination normal.  Psychiatric:        Mood and Affect: Mood normal.    CMP Latest Ref Rng & Units 03/19/2020  Glucose 70 - 99 mg/dL 95  BUN 8 - 23 mg/dL 12  Creatinine 0.44 - 1.00 mg/dL 1.01(H)  Sodium 135 - 145 mmol/L 142  Potassium 3.5 - 5.1 mmol/L 4.1  Chloride 98 - 111 mmol/L 102  CO2 22 - 32 mmol/L 31  Calcium 8.9 - 10.3 mg/dL 9.9  Total Protein 6.5 - 8.1 g/dL 7.5  Total Bilirubin 0.3 - 1.2 mg/dL 0.8  Alkaline Phos 38 - 126 U/L 37(L)  AST 15 - 41 U/L 25  ALT 0 - 44 U/L 16   CBC Latest Ref Rng & Units 03/19/2020  WBC 4.0 - 10.5 K/uL 3.5(L)  Hemoglobin 12.0 - 15.0 g/dL 12.7  Hematocrit 36.0 - 46.0 % 38.9  Platelets 150 - 400  K/uL 207    LABORATORY DATA:  I have reviewed the data as listed Lab Results  Component Value Date   WBC 3.5 (L) 03/19/2020   HGB 12.7 03/19/2020   HCT 38.9 03/19/2020   MCV 92.0 03/19/2020   PLT 207 03/19/2020   Recent Labs    01/13/20 0949 01/14/20 0724 01/29/20 1038 02/18/20 1339 03/19/20 0826  NA 139   < > 139 139 142  K 3.6   < > 3.5  3.7 4.1  CL 100   < > 98 102 102  CO2 28   < > '29 28 31  '$ GLUCOSE 86   < > 91 103* 95  BUN 11   < > '11 14 12  '$ CREATININE 0.86   < > 0.87 1.01* 1.01*  CALCIUM 9.7   < > 9.6 9.3 9.9  GFRNONAA >60   < > >60 56* 56*  GFRAA >60   < > >60 >60 >60  PROT 7.4  --   --  7.0 7.5  ALBUMIN 4.3  --   --  4.0 4.4  AST 28  --   --  22 25  ALT 13  --   --  14 16  ALKPHOS 41  --   --  34* 37*  BILITOT 0.6  --   --  0.6 0.8   < > = values in this interval not displayed.   Iron/TIBC/Ferritin/ %Sat    Component Value Date/Time   IRON 59 01/21/2020 1257   TIBC 286 01/21/2020 1257   FERRITIN 436 (H) 01/21/2020 1257   IRONPCTSAT 21 01/21/2020 1257     RADIOGRAPHIC STUDIES: I have personally reviewed the radiological images as listed and agreed with the findings in the report. CT Chest W Contrast  Result Date: 02/25/2020 CLINICAL DATA:  Restaging of rectal cancer. EXAM: CT CHEST, ABDOMEN, AND PELVIS WITH CONTRAST TECHNIQUE: Multidetector CT imaging of the chest, abdomen and pelvis was performed following the standard protocol during bolus administration of intravenous contrast. CONTRAST:  133m OMNIPAQUE IOHEXOL 300 MG/ML  SOLN COMPARISON:  01/13/2020 FINDINGS: CT CHEST FINDINGS Cardiovascular: RIGHT IJ Port-A-Cath in place. Similar to the previous exam pulmonary embolism in the RIGHT pulmonary artery not seen on the current study. Main pulmonary artery Mains dilated to 3.5 cm. Heart size is stable without pericardial effusion. Mediastinum/Nodes: Thoracic inlet structures are normal. No axillary lymphadenopathy. No hilar lymphadenopathy. No mediastinal lymphadenopathy. Esophagus grossly normal. Lungs/Pleura: Mild scarring in the lingula and RIGHT middle lobe without bronchiectasis or nodularity. Minimal basilar atelectasis. Tiny nodule along the fissure in the RIGHT chest approximately 2 mm (image 82, series 4) unchanged. Airways are patent. Musculoskeletal: No evidence of chest wall lesion. See below for full  musculoskeletal details. CT ABDOMEN PELVIS FINDINGS Hepatobiliary: No focal, suspicious hepatic lesion with patent portal vein. Slight increase in intrahepatic biliary ductal distension particularly the to the LEFT hepatic lobe. Also present on the RIGHT, slightly increased from the prior exam. Extrahepatic biliary tree is stable and there are changes of cholecystectomy. Increasing distension of the pancreatic duct since February of 2020 approximately 3 mm greatest caliber previously nondilated but mildly dilated also on the most recent comparison study. No peripancreatic inflammation, no ductal dilation no visible lesion. Pancreas: See above Spleen: Spleen normal size without focal lesion. Adrenals/Urinary Tract: Adrenal glands are normal. Bilateral low-density renal lesions compatible with cysts unchanged. Stomach/Bowel: Colonic diverticulosis with LEFT lower quadrant colostomy. Normal appendix. Post APR with descent of small bowel loops into the pelvis.  No evidence of bowel obstruction or acute bowel process. Small amount of fluid density in the presacral region adjacent to small bowel loops approximately 2 x 1.0 cm (image 99) area was present on the previous exam and perhaps slightly more dense on the prior study but is unchanged in terms of morphology and location. Small amount of fluid about the thickened sigmoid colon in the abdominal wall. There was fluid in this location before, slightly increased when compared to the prior study. Diverticular disease without frank signs of diverticulitis on the current study. Vascular/Lymphatic: Calcified atheromatous plaque in the abdominal aorta. No aneurysmal dilation. No adenopathy in the upper abdomen or in the retroperitoneum. No sign of pelvic lymphadenopathy. Reproductive: Status post hysterectomy. Descent of the urinary bladder and bowel loops to the pelvic floor Other: Small amount of fluid adjacent to the ostomy and peristomal herniation of a small amount of  fat. Findings are similar with fluid slightly increased when compared to the prior study. There is also body wall edema Musculoskeletal: No acute bone finding. No destructive bone process. Spinal degenerative changes and degenerative changes in the hips. IMPRESSION: 1. No signs of metastatic disease. 2. Tiny 2 mm nodule along the fissure in the RIGHT chest, unchanged. 3. Slight increase in intrahepatic biliary ductal distension particularly to the LEFT hepatic lobe. Also present on the RIGHT, slightly increased from the prior exam. Extrahepatic biliary tree is stable and there are changes of cholecystectomy. Increasing distension of the pancreatic duct since February of 2020 but also mildly dilated also on the most recent comparison study. No visible lesion. Correlate with any clinical or laboratory evidence of biliary obstruction. Attention on follow-up. MRCP could be performed if there are symptoms or increasing laboratory values. 4. Colonic diverticulosis with LEFT lower quadrant colostomy. Small amount of fluid adjacent to the ostomy and peristomal herniation of a small amount of fat. Findings are similar with slightly increased fluid when compared to the prior study. Correlate with any symptoms of inflammation about the ostomy. 5. Small amount of presacral fluid or soft tissue density similar to prior study. Attention on follow-up. 6. Emphysema and aortic atherosclerosis. 7. Embolism seen on the prior exam the pulmonary arteries is no longer visualized with limited assessment of the pulmonary arteries. Dilation of main pulmonary artery to 3.5 cm may be indicative of pulmonary arterial hypertension. Aortic Atherosclerosis (ICD10-I70.0) and Emphysema (ICD10-J43.9). Electronically Signed   By: Zetta Bills M.D.   On: 02/25/2020 16:09   CT Chest W Contrast  Result Date: 01/13/2020 CLINICAL DATA:  Rectal cancer. Restaging. EXAM: CT CHEST, ABDOMEN, AND PELVIS WITH CONTRAST TECHNIQUE: Multidetector CT imaging of  the chest, abdomen and pelvis was performed following the standard protocol during bolus administration of intravenous contrast. CONTRAST:  34m OMNIPAQUE IOHEXOL 300 MG/ML  SOLN COMPARISON:  11/19/2018 FINDINGS: CT CHEST FINDINGS Cardiovascular: The heart size is normal. No substantial pericardial effusion. Coronary artery calcification is evident. Atherosclerotic calcification is noted in the wall of the thoracic aorta. Right Port-A-Cath tip is positioned at the SVC/RA junction. Nonocclusive acute pulmonary embolus identified in the right main pulmonary artery extending out into the interlobar pulmonary artery. There is a tiny string like pulmonary embolus in subsegmental arteries to the right lower lobe (image 36/series 2). Mediastinum/Nodes: No mediastinal lymphadenopathy. There is no hilar lymphadenopathy. The esophagus has normal imaging features. There is no axillary lymphadenopathy. Lungs/Pleura: No suspicious pulmonary nodule or mass. No consolidation. No effusion. Minimal atelectasis noted posterior right base. Musculoskeletal: No worrisome lytic or sclerotic  osseous abnormality. CT ABDOMEN PELVIS FINDINGS Hepatobiliary: No suspicious focal abnormality within the liver parenchyma. Gallbladder surgically absent. No intrahepatic or extrahepatic biliary dilation. Pancreas: No focal mass lesion. No dilatation of the main duct. No intraparenchymal cyst. No peripancreatic edema. Spleen: No splenomegaly. No focal mass lesion. Adrenals/Urinary Tract: No adrenal nodule or mass. Bilateral renal cysts again noted. There are tiny hypodensities in both kidneys that are too small to characterize, similar to prior. No evidence for hydroureter. The urinary bladder appears normal for the degree of distention. Stomach/Bowel: Stomach is unremarkable. No gastric wall thickening. No evidence of outlet obstruction. Duodenum is normally positioned as is the ligament of Treitz. No small bowel wall thickening. No small bowel  dilatation. The appendix is normal. Sigmoid end colostomy noted. Vascular/Lymphatic: There is abdominal aortic atherosclerosis without aneurysm. There is no gastrohepatic or hepatoduodenal ligament lymphadenopathy. No retroperitoneal or mesenteric lymphadenopathy. No pelvic sidewall lymphadenopathy. Laminar flow of un opacified blood identified in both common femoral veins. Reproductive: Uterus surgically absent. There is no adnexal mass. Skin thickening in the region the vaginal introitus may be related to prior surgery/radiation. Other: No intraperitoneal free fluid. There is some trace presacral soft tissue likely treatment related. Musculoskeletal: No worrisome lytic or sclerotic osseous abnormality. IMPRESSION: 1. No definite evidence for metastatic disease in the chest, abdomen, or pelvis. This is the patient's first postsurgical study in our system and will now establish baseline for follow-up imaging. 2. Nonocclusive acute pulmonary embolus in the right main pulmonary artery extending out into the interlobar pulmonary artery. Tiny string like pulmonary embolus identified in subsegmental arteries to the right lower lobe. 3. Trace presacral soft tissue density is probably treatment related. Attention on follow-up recommended. 4. Aortic Atherosclerosis (ICD10-I70.0). Critical Value/emergent results were called by telephone at the time of interpretation on 01/13/2020 at 9:13 am to provider [facility] , who verbally acknowledged these results. After this conversation, I discussed the findings of today's study with the patient and her daughter. The patient was in no acute distress and was not short of breath. They will proceed directly to the [facility] hospital emergency department after leaving the imaging center. We will contact triage at the emergency department and alert them to the patient's arrival. Electronically Signed   By: Misty Stanley M.D.   On: 01/13/2020 09:14   MR PELVIS W WO  CONTRAST  Result Date: 03/17/2020 CLINICAL DATA:  Increased intrahepatic biliary ductal dilatation, history of rectal cancer, status post APR and colostomy EXAM: MRI ABDOMEN AND PELVIS WITHOUT AND WITH CONTRAST TECHNIQUE: Multiplanar multisequence MR imaging of the abdomen and pelvis was performed both before and after the administration of intravenous contrast. CONTRAST:  7.71m GADAVIST GADOBUTROL 1 MMOL/ML IV SOLN COMPARISON:  CT chest abdomen pelvis, 02/25/2020 FINDINGS: COMBINED FINDINGS FOR BOTH MR ABDOMEN AND PELVIS Lower chest: No acute findings. Hepatobiliary: No mass or other parenchymal abnormality identified. Status post cholecystectomy. There is mild intrahepatic biliary ductal dilatation; the common bile duct is nondilated measuring up to 6 mm. Pancreas: The pancreatic duct is prominent along its length, measuring up to approximately 5 mm. No obstructing lesion identified to the ampulla. No mass, inflammatory changes, or other parenchymal abnormality identified. Spleen:  Within normal limits in size and appearance. Adrenals/Urinary Tract: No masses identified. Numerous bilateral nonenhancing renal cysts, at least 1 demonstrating intrinsic T1 hyperintensity consistent with a hemorrhagic or proteinaceous cyst. No evidence of hydronephrosis. Stomach/Bowel: The stomach and small bowel are unremarkable. Normal appendix. Status post abdominoperineal resection and low midline  and sigmoid colostomy. Diverticulosis of the sigmoid colon remainder. Vascular/Lymphatic: No pathologically enlarged lymph nodes identified. No abdominal aortic aneurysm demonstrated. Reproductive: Status post hysterectomy. Other:  Trace fluid in the low pelvis. Musculoskeletal: No suspicious bone lesions identified. IMPRESSION: 1. Mild intrahepatic biliary ductal dilatation status post cholecystectomy. The common bile duct is nondilated measuring up to 6 mm. No mass or suspicious contrast enhancement in the liver to explain biliary  ductal dilatation. 2. Pancreatic duct is prominent along its length, measuring up to approximately 5 mm. No obstructing mass or lesion identified to the ampulla. 3. Postoperative findings of abdominoperineal resection and sigmoid end colostomy. No evidence of malignant recurrence in the pelvis. 4. Trace, nonspecific presacral fluid in the low pelvis as seen on prior CT. Electronically Signed   By: Eddie Candle M.D.   On: 03/17/2020 10:10   CT Abdomen Pelvis W Contrast  Result Date: 02/25/2020 CLINICAL DATA:  Restaging of rectal cancer. EXAM: CT CHEST, ABDOMEN, AND PELVIS WITH CONTRAST TECHNIQUE: Multidetector CT imaging of the chest, abdomen and pelvis was performed following the standard protocol during bolus administration of intravenous contrast. CONTRAST:  132m OMNIPAQUE IOHEXOL 300 MG/ML  SOLN COMPARISON:  01/13/2020 FINDINGS: CT CHEST FINDINGS Cardiovascular: RIGHT IJ Port-A-Cath in place. Similar to the previous exam pulmonary embolism in the RIGHT pulmonary artery not seen on the current study. Main pulmonary artery Mains dilated to 3.5 cm. Heart size is stable without pericardial effusion. Mediastinum/Nodes: Thoracic inlet structures are normal. No axillary lymphadenopathy. No hilar lymphadenopathy. No mediastinal lymphadenopathy. Esophagus grossly normal. Lungs/Pleura: Mild scarring in the lingula and RIGHT middle lobe without bronchiectasis or nodularity. Minimal basilar atelectasis. Tiny nodule along the fissure in the RIGHT chest approximately 2 mm (image 82, series 4) unchanged. Airways are patent. Musculoskeletal: No evidence of chest wall lesion. See below for full musculoskeletal details. CT ABDOMEN PELVIS FINDINGS Hepatobiliary: No focal, suspicious hepatic lesion with patent portal vein. Slight increase in intrahepatic biliary ductal distension particularly the to the LEFT hepatic lobe. Also present on the RIGHT, slightly increased from the prior exam. Extrahepatic biliary tree is stable and  there are changes of cholecystectomy. Increasing distension of the pancreatic duct since February of 2020 approximately 3 mm greatest caliber previously nondilated but mildly dilated also on the most recent comparison study. No peripancreatic inflammation, no ductal dilation no visible lesion. Pancreas: See above Spleen: Spleen normal size without focal lesion. Adrenals/Urinary Tract: Adrenal glands are normal. Bilateral low-density renal lesions compatible with cysts unchanged. Stomach/Bowel: Colonic diverticulosis with LEFT lower quadrant colostomy. Normal appendix. Post APR with descent of small bowel loops into the pelvis. No evidence of bowel obstruction or acute bowel process. Small amount of fluid density in the presacral region adjacent to small bowel loops approximately 2 x 1.0 cm (image 99) area was present on the previous exam and perhaps slightly more dense on the prior study but is unchanged in terms of morphology and location. Small amount of fluid about the thickened sigmoid colon in the abdominal wall. There was fluid in this location before, slightly increased when compared to the prior study. Diverticular disease without frank signs of diverticulitis on the current study. Vascular/Lymphatic: Calcified atheromatous plaque in the abdominal aorta. No aneurysmal dilation. No adenopathy in the upper abdomen or in the retroperitoneum. No sign of pelvic lymphadenopathy. Reproductive: Status post hysterectomy. Descent of the urinary bladder and bowel loops to the pelvic floor Other: Small amount of fluid adjacent to the ostomy and peristomal herniation of a small amount of  fat. Findings are similar with fluid slightly increased when compared to the prior study. There is also body wall edema Musculoskeletal: No acute bone finding. No destructive bone process. Spinal degenerative changes and degenerative changes in the hips. IMPRESSION: 1. No signs of metastatic disease. 2. Tiny 2 mm nodule along the fissure  in the RIGHT chest, unchanged. 3. Slight increase in intrahepatic biliary ductal distension particularly to the LEFT hepatic lobe. Also present on the RIGHT, slightly increased from the prior exam. Extrahepatic biliary tree is stable and there are changes of cholecystectomy. Increasing distension of the pancreatic duct since February of 2020 but also mildly dilated also on the most recent comparison study. No visible lesion. Correlate with any clinical or laboratory evidence of biliary obstruction. Attention on follow-up. MRCP could be performed if there are symptoms or increasing laboratory values. 4. Colonic diverticulosis with LEFT lower quadrant colostomy. Small amount of fluid adjacent to the ostomy and peristomal herniation of a small amount of fat. Findings are similar with slightly increased fluid when compared to the prior study. Correlate with any symptoms of inflammation about the ostomy. 5. Small amount of presacral fluid or soft tissue density similar to prior study. Attention on follow-up. 6. Emphysema and aortic atherosclerosis. 7. Embolism seen on the prior exam the pulmonary arteries is no longer visualized with limited assessment of the pulmonary arteries. Dilation of main pulmonary artery to 3.5 cm may be indicative of pulmonary arterial hypertension. Aortic Atherosclerosis (ICD10-I70.0) and Emphysema (ICD10-J43.9). Electronically Signed   By: Zetta Bills M.D.   On: 02/25/2020 16:09   CT Abdomen Pelvis W Contrast  Result Date: 01/13/2020 CLINICAL DATA:  Rectal cancer. Restaging. EXAM: CT CHEST, ABDOMEN, AND PELVIS WITH CONTRAST TECHNIQUE: Multidetector CT imaging of the chest, abdomen and pelvis was performed following the standard protocol during bolus administration of intravenous contrast. CONTRAST:  84m OMNIPAQUE IOHEXOL 300 MG/ML  SOLN COMPARISON:  11/19/2018 FINDINGS: CT CHEST FINDINGS Cardiovascular: The heart size is normal. No substantial pericardial effusion. Coronary artery  calcification is evident. Atherosclerotic calcification is noted in the wall of the thoracic aorta. Right Port-A-Cath tip is positioned at the SVC/RA junction. Nonocclusive acute pulmonary embolus identified in the right main pulmonary artery extending out into the interlobar pulmonary artery. There is a tiny string like pulmonary embolus in subsegmental arteries to the right lower lobe (image 36/series 2). Mediastinum/Nodes: No mediastinal lymphadenopathy. There is no hilar lymphadenopathy. The esophagus has normal imaging features. There is no axillary lymphadenopathy. Lungs/Pleura: No suspicious pulmonary nodule or mass. No consolidation. No effusion. Minimal atelectasis noted posterior right base. Musculoskeletal: No worrisome lytic or sclerotic osseous abnormality. CT ABDOMEN PELVIS FINDINGS Hepatobiliary: No suspicious focal abnormality within the liver parenchyma. Gallbladder surgically absent. No intrahepatic or extrahepatic biliary dilation. Pancreas: No focal mass lesion. No dilatation of the main duct. No intraparenchymal cyst. No peripancreatic edema. Spleen: No splenomegaly. No focal mass lesion. Adrenals/Urinary Tract: No adrenal nodule or mass. Bilateral renal cysts again noted. There are tiny hypodensities in both kidneys that are too small to characterize, similar to prior. No evidence for hydroureter. The urinary bladder appears normal for the degree of distention. Stomach/Bowel: Stomach is unremarkable. No gastric wall thickening. No evidence of outlet obstruction. Duodenum is normally positioned as is the ligament of Treitz. No small bowel wall thickening. No small bowel dilatation. The appendix is normal. Sigmoid end colostomy noted. Vascular/Lymphatic: There is abdominal aortic atherosclerosis without aneurysm. There is no gastrohepatic or hepatoduodenal ligament lymphadenopathy. No retroperitoneal or mesenteric lymphadenopathy. No  pelvic sidewall lymphadenopathy. Laminar flow of un opacified  blood identified in both common femoral veins. Reproductive: Uterus surgically absent. There is no adnexal mass. Skin thickening in the region the vaginal introitus may be related to prior surgery/radiation. Other: No intraperitoneal free fluid. There is some trace presacral soft tissue likely treatment related. Musculoskeletal: No worrisome lytic or sclerotic osseous abnormality. IMPRESSION: 1. No definite evidence for metastatic disease in the chest, abdomen, or pelvis. This is the patient's first postsurgical study in our system and will now establish baseline for follow-up imaging. 2. Nonocclusive acute pulmonary embolus in the right main pulmonary artery extending out into the interlobar pulmonary artery. Tiny string like pulmonary embolus identified in subsegmental arteries to the right lower lobe. 3. Trace presacral soft tissue density is probably treatment related. Attention on follow-up recommended. 4. Aortic Atherosclerosis (ICD10-I70.0). Critical Value/emergent results were called by telephone at the time of interpretation on 01/13/2020 at 9:13 am to provider [facility] , who verbally acknowledged these results. After this conversation, I discussed the findings of today's study with the patient and her daughter. The patient was in no acute distress and was not short of breath. They will proceed directly to the [facility] hospital emergency department after leaving the imaging center. We will contact triage at the emergency department and alert them to the patient's arrival. Electronically Signed   By: Misty Stanley M.D.   On: 01/13/2020 09:14   US Venous Img Lower Bilateral (DVT)  Result Date: 01/13/2020 CLINICAL DATA:  Acute pulmonary embolus. EXAM: BILATERAL LOWER EXTREMITY VENOUS DOPPLER ULTRASOUND TECHNIQUE: Gray-scale sonography with compression, as well as color and duplex ultrasound, were performed to evaluate the deep venous system(s) from the level of the common femoral vein through the  popliteal and proximal calf veins. COMPARISON:  Chest CT earlier today, no prior lower extremity duplex. FINDINGS: VENOUS Right lower extremity: Normal compressibility of the common femoral, superficial femoral, and popliteal veins, as well as the visualized calf veins. Doppler waveforms show normal direction of venous flow, normal respiratory phasicity and response to augmentation. There is nonocclusive thrombus in the profundus femoral vein. No other filling defects to suggest DVT on grayscale or color Doppler imaging. Left lower extremity: There is nonocclusive thrombus throughout the left lower extremity veins including the common femoral, profundus femoral, femoral, popliteal, and posterior tibial veins. Vessels are partially compressible. No thrombus in the peroneal vein. OTHER None. Limitations: none IMPRESSION: 1. Positive for DVT throughout the left lower extremity veins with nonocclusive thrombus extending from the common femoral through the posterior tibial veins. 2. Nonocclusive thrombus in the right profundus femoral vein. No other DVT in the right lower extremity. These results will be called to the ordering clinician or representative by the Radiologist Assistant, and communication documented in the PACS or [employer]. Electronically Signed   By: Keith Rake M.D.   On: 01/13/2020 17:28   MR ABDOMEN MRCP W WO CONTAST  Result Date: 03/17/2020 CLINICAL DATA:  Increased intrahepatic biliary ductal dilatation, history of rectal cancer, status post APR and colostomy EXAM: MRI ABDOMEN AND PELVIS WITHOUT AND WITH CONTRAST TECHNIQUE: Multiplanar multisequence MR imaging of the abdomen and pelvis was performed both before and after the administration of intravenous contrast. CONTRAST:  7.69m GADAVIST GADOBUTROL 1 MMOL/ML IV SOLN COMPARISON:  CT chest abdomen pelvis, 02/25/2020 FINDINGS: COMBINED FINDINGS FOR BOTH MR ABDOMEN AND PELVIS Lower chest: No acute findings. Hepatobiliary: No mass or other  parenchymal abnormality identified. Status post cholecystectomy. There is mild intrahepatic  biliary ductal dilatation; the common bile duct is nondilated measuring up to 6 mm. Pancreas: The pancreatic duct is prominent along its length, measuring up to approximately 5 mm. No obstructing lesion identified to the ampulla. No mass, inflammatory changes, or other parenchymal abnormality identified. Spleen:  Within normal limits in size and appearance. Adrenals/Urinary Tract: No masses identified. Numerous bilateral nonenhancing renal cysts, at least 1 demonstrating intrinsic T1 hyperintensity consistent with a hemorrhagic or proteinaceous cyst. No evidence of hydronephrosis. Stomach/Bowel: The stomach and small bowel are unremarkable. Normal appendix. Status post abdominoperineal resection and low midline and sigmoid colostomy. Diverticulosis of the sigmoid colon remainder. Vascular/Lymphatic: No pathologically enlarged lymph nodes identified. No abdominal aortic aneurysm demonstrated. Reproductive: Status post hysterectomy. Other:  Trace fluid in the low pelvis. Musculoskeletal: No suspicious bone lesions identified. IMPRESSION: 1. Mild intrahepatic biliary ductal dilatation status post cholecystectomy. The common bile duct is nondilated measuring up to 6 mm. No mass or suspicious contrast enhancement in the liver to explain biliary ductal dilatation. 2. Pancreatic duct is prominent along its length, measuring up to approximately 5 mm. No obstructing mass or lesion identified to the ampulla. 3. Postoperative findings of abdominoperineal resection and sigmoid end colostomy. No evidence of malignant recurrence in the pelvis. 4. Trace, nonspecific presacral fluid in the low pelvis as seen on prior CT. Electronically Signed   By: Eddie Candle M.D.   On: 03/17/2020 10:10   ECHOCARDIOGRAM COMPLETE  Result Date: 01/14/2020    ECHOCARDIOGRAM REPORT   Patient Name:   CYANI KALLSTROM Date of Exam: 01/13/2020 Medical Rec #:   [id number]      Height:       65.0 in Accession #:    [id number]     Weight:       172.0 lb Date of Birth:  10-Jun-1946      BSA:          1.855 m Patient Age:    53 years       BP:           129/68 mmHg Patient Gender: F              HR:           79 bpm. Exam Location:  [facility] Procedure: 2D Echo, Cardiac Doppler and Color Doppler Indications:     I26.99 Pulmonary Embolus  History:         Patient has no prior history of Echocardiogram examinations.                  Risk Factors:Hypertension and Dyslipidemia. Lower extremity                  edema.  Sonographer:     Wilford Sports Rodgers-Jones Referring Phys:  Unknown Foley NIU Diagnosing Phys: Nelva Bush MD IMPRESSIONS  1. Left ventricular ejection fraction, by estimation, is 60 to 65%. The left ventricle has normal function. The left ventricle has no regional wall motion abnormalities. There is mild left ventricular hypertrophy. Left ventricular diastolic parameters are consistent with Grade I diastolic dysfunction (impaired relaxation).  2. Right ventricular systolic function is normal. The right ventricular size is normal. Tricuspid regurgitation signal is inadequate for assessing PA pressure.  3. The mitral valve is degenerative. Trivial mitral valve regurgitation. No evidence of mitral stenosis.  4. The aortic valve is tricuspid. Aortic valve regurgitation is not visualized. No aortic stenosis is present. FINDINGS  Left Ventricle: Left ventricular ejection fraction, by estimation, is 60 to 65%.  The left ventricle has normal function. The left ventricle has no regional wall motion abnormalities. The left ventricular internal cavity size was normal in size. There is  mild left ventricular hypertrophy. Left ventricular diastolic parameters are consistent with Grade I diastolic dysfunction (impaired relaxation). Right Ventricle: The right ventricular size is normal. No increase in right ventricular wall thickness. Right ventricular systolic function is normal. Tricuspid  regurgitation signal is inadequate for assessing PA pressure. Left Atrium: Left atrial size was normal in size. Right Atrium: Right atrial size was normal in size. Pericardium: There is no evidence of pericardial effusion. Mitral Valve: The mitral valve is degenerative in appearance. There is mild thickening of the mitral valve leaflet(s). Mild mitral annular calcification. Trivial mitral valve regurgitation. No evidence of mitral valve stenosis. Tricuspid Valve: The tricuspid valve is normal in structure. Tricuspid valve regurgitation is trivial. Aortic Valve: The aortic valve is tricuspid. Aortic valve regurgitation is not visualized. No aortic stenosis is present. Pulmonic Valve: The pulmonic valve was not well visualized. Pulmonic valve regurgitation is not visualized. No evidence of pulmonic stenosis. Aorta: The aortic root is normal in size and structure. Pulmonary Artery: The pulmonary artery is of normal size. Venous: The inferior vena cava was not well visualized. IAS/Shunts: The interatrial septum was not well visualized. Additional Comments: A venous catheter is visualized in the right atrium.  LEFT VENTRICLE PLAX 2D LVIDd:         4.10 cm Diastology LVIDs:         2.67 cm LV e' lateral:   8.05 cm/s LV PW:         1.11 cm LV E/e' lateral: 10.7 LV IVS:        1.15 cm LV e' medial:    5.87 cm/s                        LV E/e' medial:  14.6  RIGHT VENTRICLE RV Basal diam:  4.14 cm RV S prime:     17.20 cm/s TAPSE (M-mode): 2.0 cm LEFT ATRIUM             Index       RIGHT ATRIUM           Index LA diam:        4.40 cm 2.37 cm/m  RA Area:     13.40 cm LA Vol (A2C):   33.0 ml 17.79 ml/m RA Volume:   36.20 ml  19.51 ml/m LA Vol (A4C):   47.1 ml 25.39 ml/m LA Biplane Vol: 42.2 ml 22.75 ml/m   AORTA Ao Root diam: 2.90 cm MITRAL VALVE MV Area (PHT): 2.62 cm MV Decel Time: 289 msec MV E velocity: 85.90 cm/s MV A velocity: 109.00 cm/s MV E/A ratio:  0.79 Harrell Gave End MD Electronically signed by Nelva Bush MD Signature Date/Time: 01/14/2020/6:41:59 AM    Final     CT Chest W Contrast  Result Date: 02/25/2020 CLINICAL DATA:  Restaging of rectal cancer. EXAM: CT CHEST, ABDOMEN, AND PELVIS WITH CONTRAST TECHNIQUE: Multidetector CT imaging of the chest, abdomen and pelvis was performed following the standard protocol during bolus administration of intravenous contrast. CONTRAST:  165m OMNIPAQUE IOHEXOL 300 MG/ML  SOLN COMPARISON:  01/13/2020 FINDINGS: CT CHEST FINDINGS Cardiovascular: RIGHT IJ Port-A-Cath in place. Similar to the previous exam pulmonary embolism in the RIGHT pulmonary artery not seen on the current study. Main pulmonary artery Mains dilated to 3.5 cm. Heart size is stable without pericardial effusion.  Mediastinum/Nodes: Thoracic inlet structures are normal. No axillary lymphadenopathy. No hilar lymphadenopathy. No mediastinal lymphadenopathy. Esophagus grossly normal. Lungs/Pleura: Mild scarring in the lingula and RIGHT middle lobe without bronchiectasis or nodularity. Minimal basilar atelectasis. Tiny nodule along the fissure in the RIGHT chest approximately 2 mm (image 82, series 4) unchanged. Airways are patent. Musculoskeletal: No evidence of chest wall lesion. See below for full musculoskeletal details. CT ABDOMEN PELVIS FINDINGS Hepatobiliary: No focal, suspicious hepatic lesion with patent portal vein. Slight increase in intrahepatic biliary ductal distension particularly the to the LEFT hepatic lobe. Also present on the RIGHT, slightly increased from the prior exam. Extrahepatic biliary tree is stable and there are changes of cholecystectomy. Increasing distension of the pancreatic duct since February of 2020 approximately 3 mm greatest caliber previously nondilated but mildly dilated also on the most recent comparison study. No peripancreatic inflammation, no ductal dilation no visible lesion. Pancreas: See above Spleen: Spleen normal size without focal lesion. Adrenals/Urinary Tract:  Adrenal glands are normal. Bilateral low-density renal lesions compatible with cysts unchanged. Stomach/Bowel: Colonic diverticulosis with LEFT lower quadrant colostomy. Normal appendix. Post APR with descent of small bowel loops into the pelvis. No evidence of bowel obstruction or acute bowel process. Small amount of fluid density in the presacral region adjacent to small bowel loops approximately 2 x 1.0 cm (image 99) area was present on the previous exam and perhaps slightly more dense on the prior study but is unchanged in terms of morphology and location. Small amount of fluid about the thickened sigmoid colon in the abdominal wall. There was fluid in this location before, slightly increased when compared to the prior study. Diverticular disease without frank signs of diverticulitis on the current study. Vascular/Lymphatic: Calcified atheromatous plaque in the abdominal aorta. No aneurysmal dilation. No adenopathy in the upper abdomen or in the retroperitoneum. No sign of pelvic lymphadenopathy. Reproductive: Status post hysterectomy. Descent of the urinary bladder and bowel loops to the pelvic floor Other: Small amount of fluid adjacent to the ostomy and peristomal herniation of a small amount of fat. Findings are similar with fluid slightly increased when compared to the prior study. There is also body wall edema Musculoskeletal: No acute bone finding. No destructive bone process. Spinal degenerative changes and degenerative changes in the hips. IMPRESSION: 1. No signs of metastatic disease. 2. Tiny 2 mm nodule along the fissure in the RIGHT chest, unchanged. 3. Slight increase in intrahepatic biliary ductal distension particularly to the LEFT hepatic lobe. Also present on the RIGHT, slightly increased from the prior exam. Extrahepatic biliary tree is stable and there are changes of cholecystectomy. Increasing distension of the pancreatic duct since February of 2020 but also mildly dilated also on the most  recent comparison study. No visible lesion. Correlate with any clinical or laboratory evidence of biliary obstruction. Attention on follow-up. MRCP could be performed if there are symptoms or increasing laboratory values. 4. Colonic diverticulosis with LEFT lower quadrant colostomy. Small amount of fluid adjacent to the ostomy and peristomal herniation of a small amount of fat. Findings are similar with slightly increased fluid when compared to the prior study. Correlate with any symptoms of inflammation about the ostomy. 5. Small amount of presacral fluid or soft tissue density similar to prior study. Attention on follow-up. 6. Emphysema and aortic atherosclerosis. 7. Embolism seen on the prior exam the pulmonary arteries is no longer visualized with limited assessment of the pulmonary arteries. Dilation of main pulmonary artery to 3.5 cm may be indicative of pulmonary  arterial hypertension. Aortic Atherosclerosis (ICD10-I70.0) and Emphysema (ICD10-J43.9). Electronically Signed   By: Zetta Bills M.D.   On: 02/25/2020 16:09   CT Chest W Contrast  Result Date: 01/13/2020 CLINICAL DATA:  Rectal cancer. Restaging. EXAM: CT CHEST, ABDOMEN, AND PELVIS WITH CONTRAST TECHNIQUE: Multidetector CT imaging of the chest, abdomen and pelvis was performed following the standard protocol during bolus administration of intravenous contrast. CONTRAST:  35m OMNIPAQUE IOHEXOL 300 MG/ML  SOLN COMPARISON:  11/19/2018 FINDINGS: CT CHEST FINDINGS Cardiovascular: The heart size is normal. No substantial pericardial effusion. Coronary artery calcification is evident. Atherosclerotic calcification is noted in the wall of the thoracic aorta. Right Port-A-Cath tip is positioned at the SVC/RA junction. Nonocclusive acute pulmonary embolus identified in the right main pulmonary artery extending out into the interlobar pulmonary artery. There is a tiny string like pulmonary embolus in subsegmental arteries to the right lower lobe (image  36/series 2). Mediastinum/Nodes: No mediastinal lymphadenopathy. There is no hilar lymphadenopathy. The esophagus has normal imaging features. There is no axillary lymphadenopathy. Lungs/Pleura: No suspicious pulmonary nodule or mass. No consolidation. No effusion. Minimal atelectasis noted posterior right base. Musculoskeletal: No worrisome lytic or sclerotic osseous abnormality. CT ABDOMEN PELVIS FINDINGS Hepatobiliary: No suspicious focal abnormality within the liver parenchyma. Gallbladder surgically absent. No intrahepatic or extrahepatic biliary dilation. Pancreas: No focal mass lesion. No dilatation of the main duct. No intraparenchymal cyst. No peripancreatic edema. Spleen: No splenomegaly. No focal mass lesion. Adrenals/Urinary Tract: No adrenal nodule or mass. Bilateral renal cysts again noted. There are tiny hypodensities in both kidneys that are too small to characterize, similar to prior. No evidence for hydroureter. The urinary bladder appears normal for the degree of distention. Stomach/Bowel: Stomach is unremarkable. No gastric wall thickening. No evidence of outlet obstruction. Duodenum is normally positioned as is the ligament of Treitz. No small bowel wall thickening. No small bowel dilatation. The appendix is normal. Sigmoid end colostomy noted. Vascular/Lymphatic: There is abdominal aortic atherosclerosis without aneurysm. There is no gastrohepatic or hepatoduodenal ligament lymphadenopathy. No retroperitoneal or mesenteric lymphadenopathy. No pelvic sidewall lymphadenopathy. Laminar flow of un opacified blood identified in both common femoral veins. Reproductive: Uterus surgically absent. There is no adnexal mass. Skin thickening in the region the vaginal introitus may be related to prior surgery/radiation. Other: No intraperitoneal free fluid. There is some trace presacral soft tissue likely treatment related. Musculoskeletal: No worrisome lytic or sclerotic osseous abnormality. IMPRESSION: 1.  No definite evidence for metastatic disease in the chest, abdomen, or pelvis. This is the patient's first postsurgical study in our system and will now establish baseline for follow-up imaging. 2. Nonocclusive acute pulmonary embolus in the right main pulmonary artery extending out into the interlobar pulmonary artery. Tiny string like pulmonary embolus identified in subsegmental arteries to the right lower lobe. 3. Trace presacral soft tissue density is probably treatment related. Attention on follow-up recommended. 4. Aortic Atherosclerosis (ICD10-I70.0). Critical Value/emergent results were called by telephone at the time of interpretation on 01/13/2020 at 9:13 am [facility] verbally [facility] these results. After this conversation, I discussed the findings of today's study with the patient and her daughter. The patient was in no acute distress and was not short of breath. [facility]. We will contact triage at the emergency department and alert them to the patient's arrival. Electronically Signed   By: EMisty StanleyM.D.   On: 01/13/2020 09:14   MR  PELVIS W WO CONTRAST  Result Date: 03/17/2020 CLINICAL DATA:  Increased intrahepatic biliary ductal dilatation, history of rectal cancer, status post APR and colostomy EXAM: MRI ABDOMEN AND PELVIS WITHOUT AND WITH CONTRAST TECHNIQUE: Multiplanar multisequence MR imaging of the abdomen and pelvis was performed both before and after the administration of intravenous contrast. CONTRAST:  7.44m GADAVIST GADOBUTROL 1 MMOL/ML IV SOLN COMPARISON:  CT chest abdomen pelvis, 02/25/2020 FINDINGS: COMBINED FINDINGS FOR BOTH MR ABDOMEN AND PELVIS Lower chest: No acute findings. Hepatobiliary: No mass or other parenchymal abnormality identified. Status post cholecystectomy. There is mild intrahepatic biliary ductal dilatation; the common bile duct is nondilated  measuring up to 6 mm. Pancreas: The pancreatic duct is prominent along its length, measuring up to approximately 5 mm. No obstructing lesion identified to the ampulla. No mass, inflammatory changes, or other parenchymal abnormality identified. Spleen:  Within normal limits in size and appearance. Adrenals/Urinary Tract: No masses identified. Numerous bilateral nonenhancing renal cysts, at least 1 demonstrating intrinsic T1 hyperintensity consistent with a hemorrhagic or proteinaceous cyst. No evidence of hydronephrosis. Stomach/Bowel: The stomach and small bowel are unremarkable. Normal appendix. Status post abdominoperineal resection and low midline and sigmoid colostomy. Diverticulosis of the sigmoid colon remainder. Vascular/Lymphatic: No pathologically enlarged lymph nodes identified. No abdominal aortic aneurysm demonstrated. Reproductive: Status post hysterectomy. Other:  Trace fluid in the low pelvis. Musculoskeletal: No suspicious bone lesions identified. IMPRESSION: 1. Mild intrahepatic biliary ductal dilatation status post cholecystectomy. The common bile duct is nondilated measuring up to 6 mm. No mass or suspicious contrast enhancement in the liver to explain biliary ductal dilatation. 2. Pancreatic duct is prominent along its length, measuring up to approximately 5 mm. No obstructing mass or lesion identified to the ampulla. 3. Postoperative findings of abdominoperineal resection and sigmoid end colostomy. No evidence of malignant recurrence in the pelvis. 4. Trace, nonspecific presacral fluid in the low pelvis as seen on prior CT. Electronically Signed   By: AEddie CandleM.D.   On: 03/17/2020 10:10   CT Abdomen Pelvis W Contrast  Result Date: 02/25/2020 CLINICAL DATA:  Restaging of rectal cancer. EXAM: CT CHEST, ABDOMEN, AND PELVIS WITH CONTRAST TECHNIQUE: Multidetector CT imaging of the chest, abdomen and pelvis was performed following the standard protocol during bolus administration of  intravenous contrast. CONTRAST:  1013mOMNIPAQUE IOHEXOL 300 MG/ML  SOLN COMPARISON:  01/13/2020 FINDINGS: CT CHEST FINDINGS Cardiovascular: RIGHT IJ Port-A-Cath in place. Similar to the previous exam pulmonary embolism in the RIGHT pulmonary artery not seen on the current study. Main pulmonary artery Mains dilated to 3.5 cm. Heart size is stable without pericardial effusion. Mediastinum/Nodes: Thoracic inlet structures are normal. No axillary lymphadenopathy. No hilar lymphadenopathy. No mediastinal lymphadenopathy. Esophagus grossly normal. Lungs/Pleura: Mild scarring in the lingula and RIGHT middle lobe without bronchiectasis or nodularity. Minimal basilar atelectasis. Tiny nodule along the fissure in the RIGHT chest approximately 2 mm (image 82, series 4) unchanged. Airways are patent. Musculoskeletal: No evidence of chest wall lesion. See below for full musculoskeletal details. CT ABDOMEN PELVIS FINDINGS Hepatobiliary: No focal, suspicious hepatic lesion with patent portal vein. Slight increase in intrahepatic biliary ductal distension particularly the to the LEFT hepatic lobe. Also present on the RIGHT, slightly increased from the prior exam. Extrahepatic biliary tree is stable and there are changes of cholecystectomy. Increasing distension of the pancreatic duct since February of 2020 approximately 3 mm greatest caliber previously nondilated but mildly dilated also on the most recent comparison study. No peripancreatic inflammation, no ductal dilation no visible  lesion. Pancreas: See above Spleen: Spleen normal size without focal lesion. Adrenals/Urinary Tract: Adrenal glands are normal. Bilateral low-density renal lesions compatible with cysts unchanged. Stomach/Bowel: Colonic diverticulosis with LEFT lower quadrant colostomy. Normal appendix. Post APR with descent of small bowel loops into the pelvis. No evidence of bowel obstruction or acute bowel process. Small amount of fluid density in the presacral  region adjacent to small bowel loops approximately 2 x 1.0 cm (image 99) area was present on the previous exam and perhaps slightly more dense on the prior study but is unchanged in terms of morphology and location. Small amount of fluid about the thickened sigmoid colon in the abdominal wall. There was fluid in this location before, slightly increased when compared to the prior study. Diverticular disease without frank signs of diverticulitis on the current study. Vascular/Lymphatic: Calcified atheromatous plaque in the abdominal aorta. No aneurysmal dilation. No adenopathy in the upper abdomen or in the retroperitoneum. No sign of pelvic lymphadenopathy. Reproductive: Status post hysterectomy. Descent of the urinary bladder and bowel loops to the pelvic floor Other: Small amount of fluid adjacent to the ostomy and peristomal herniation of a small amount of fat. Findings are similar with fluid slightly increased when compared to the prior study. There is also body wall edema Musculoskeletal: No acute bone finding. No destructive bone process. Spinal degenerative changes and degenerative changes in the hips. IMPRESSION: 1. No signs of metastatic disease. 2. Tiny 2 mm nodule along the fissure in the RIGHT chest, unchanged. 3. Slight increase in intrahepatic biliary ductal distension particularly to the LEFT hepatic lobe. Also present on the RIGHT, slightly increased from the prior exam. Extrahepatic biliary tree is stable and there are changes of cholecystectomy. Increasing distension of the pancreatic duct since February of 2020 but also mildly dilated also on the most recent comparison study. No visible lesion. Correlate with any clinical or laboratory evidence of biliary obstruction. Attention on follow-up. MRCP could be performed if there are symptoms or increasing laboratory values. 4. Colonic diverticulosis with LEFT lower quadrant colostomy. Small amount of fluid adjacent to the ostomy and peristomal  herniation of a small amount of fat. Findings are similar with slightly increased fluid when compared to the prior study. Correlate with any symptoms of inflammation about the ostomy. 5. Small amount of presacral fluid or soft tissue density similar to prior study. Attention on follow-up. 6. Emphysema and aortic atherosclerosis. 7. Embolism seen on the prior exam the pulmonary arteries is no longer visualized with limited assessment of the pulmonary arteries. Dilation of main pulmonary artery to 3.5 cm may be indicative of pulmonary arterial hypertension. Aortic Atherosclerosis (ICD10-I70.0) and Emphysema (ICD10-J43.9). Electronically Signed   By: Zetta Bills M.D.   On: 02/25/2020 16:09   CT Abdomen Pelvis W Contrast  Result Date: 01/13/2020 CLINICAL DATA:  Rectal cancer. Restaging. EXAM: CT CHEST, ABDOMEN, AND PELVIS WITH CONTRAST TECHNIQUE: Multidetector CT imaging of the chest, abdomen and pelvis was performed following the standard protocol during bolus administration of intravenous contrast. CONTRAST:  24m OMNIPAQUE IOHEXOL 300 MG/ML  SOLN COMPARISON:  11/19/2018 FINDINGS: CT CHEST FINDINGS Cardiovascular: The heart size is normal. No substantial pericardial effusion. Coronary artery calcification is evident. Atherosclerotic calcification is noted in the wall of the thoracic aorta. Right Port-A-Cath tip is positioned at the SVC/RA junction. Nonocclusive acute pulmonary embolus identified in the right main pulmonary artery extending out into the interlobar pulmonary artery. There is a tiny string like pulmonary embolus in subsegmental arteries to the right lower  lobe (image 36/series 2). Mediastinum/Nodes: No mediastinal lymphadenopathy. There is no hilar lymphadenopathy. The esophagus has normal imaging features. There is no axillary lymphadenopathy. Lungs/Pleura: No suspicious pulmonary nodule or mass. No consolidation. No effusion. Minimal atelectasis noted posterior right base. Musculoskeletal: No  worrisome lytic or sclerotic osseous abnormality. CT ABDOMEN PELVIS FINDINGS Hepatobiliary: No suspicious focal abnormality within the liver parenchyma. Gallbladder surgically absent. No intrahepatic or extrahepatic biliary dilation. Pancreas: No focal mass lesion. No dilatation of the main duct. No intraparenchymal cyst. No peripancreatic edema. Spleen: No splenomegaly. No focal mass lesion. Adrenals/Urinary Tract: No adrenal nodule or mass. Bilateral renal cysts again noted. There are tiny hypodensities in both kidneys that are too small to characterize, similar to prior. No evidence for hydroureter. The urinary bladder appears normal for the degree of distention. Stomach/Bowel: Stomach is unremarkable. No gastric wall thickening. No evidence of outlet obstruction. Duodenum is normally positioned as is the ligament of Treitz. No small bowel wall thickening. No small bowel dilatation. The appendix is normal. Sigmoid end colostomy noted. Vascular/Lymphatic: There is abdominal aortic atherosclerosis without aneurysm. There is no gastrohepatic or hepatoduodenal ligament lymphadenopathy. No retroperitoneal or mesenteric lymphadenopathy. No pelvic sidewall lymphadenopathy. Laminar flow of un opacified blood identified in both common femoral veins. Reproductive: Uterus surgically absent. There is no adnexal mass. Skin thickening in the region the vaginal introitus may be related to prior surgery/radiation. Other: No intraperitoneal free fluid. There is some trace presacral soft tissue likely treatment related. Musculoskeletal: No worrisome lytic or sclerotic osseous abnormality. IMPRESSION: 1. No definite evidence for metastatic disease in the chest, abdomen, or pelvis. This is the patient's first postsurgical study in our system and will now establish baseline for follow-up imaging. 2. Nonocclusive acute pulmonary embolus in the right main pulmonary artery extending out into the interlobar pulmonary artery. Tiny string  like pulmonary embolus identified in subsegmental arteries to the right lower lobe. 3. Trace presacral soft tissue density is probably treatment related. Attention on follow-up recommended. 4. Aortic Atherosclerosis (ICD10-I70.0). Critical Value/emergent results were called by telephone at the time of interpretation on 01/13/2020 at 9:13 am to provider [facility] , who verbally acknowledged these results. After this conversation, I discussed the findings of today's study with the patient and her daughter. The patient was in no acute distress and was not short of breath. They will proceed directly to the [facility] hospital emergency department after leaving the imaging center. We will contact triage at the emergency department and alert them to the patient's arrival. Electronically Signed   By: Misty Stanley M.D.   On: 01/13/2020 09:14   US Venous Img Lower Bilateral (DVT)  Result Date: 01/13/2020 CLINICAL DATA:  Acute pulmonary embolus. EXAM: BILATERAL LOWER EXTREMITY VENOUS DOPPLER ULTRASOUND TECHNIQUE: Gray-scale sonography with compression, as well as color and duplex ultrasound, were performed to evaluate the deep venous system(s) from the level of the common femoral vein through the popliteal and proximal calf veins. COMPARISON:  Chest CT earlier today, no prior lower extremity duplex. FINDINGS: VENOUS Right lower extremity: Normal compressibility of the common femoral, superficial femoral, and popliteal veins, as well as the visualized calf veins. Doppler waveforms show normal direction of venous flow, normal respiratory phasicity and response to augmentation. There is nonocclusive thrombus in the profundus femoral vein. No other filling defects to suggest DVT on grayscale or color Doppler imaging. Left lower extremity: There is nonocclusive thrombus throughout the left lower extremity veins including the common femoral, profundus femoral, femoral, popliteal, and posterior tibial  veins. Vessels are  partially compressible. No thrombus in the peroneal vein. OTHER None. Limitations: none IMPRESSION: 1. Positive for DVT throughout the left lower extremity veins with nonocclusive thrombus extending from the common femoral through the posterior tibial veins. 2. Nonocclusive thrombus in the right profundus femoral vein. No other DVT in the right lower extremity. These results will be called to the ordering clinician or representative by the Radiologist Assistant, and communication documented in the PACS or [employer]. Electronically Signed   By: Keith Rake M.D.   On: 01/13/2020 17:28   MR ABDOMEN MRCP W WO CONTAST  Result Date: 03/17/2020 CLINICAL DATA:  Increased intrahepatic biliary ductal dilatation, history of rectal cancer, status post APR and colostomy EXAM: MRI ABDOMEN AND PELVIS WITHOUT AND WITH CONTRAST TECHNIQUE: Multiplanar multisequence MR imaging of the abdomen and pelvis was performed both before and after the administration of intravenous contrast. CONTRAST:  7.87m GADAVIST GADOBUTROL 1 MMOL/ML IV SOLN COMPARISON:  CT chest abdomen pelvis, 02/25/2020 FINDINGS: COMBINED FINDINGS FOR BOTH MR ABDOMEN AND PELVIS Lower chest: No acute findings. Hepatobiliary: No mass or other parenchymal abnormality identified. Status post cholecystectomy. There is mild intrahepatic biliary ductal dilatation; the common bile duct is nondilated measuring up to 6 mm. Pancreas: The pancreatic duct is prominent along its length, measuring up to approximately 5 mm. No obstructing lesion identified to the ampulla. No mass, inflammatory changes, or other parenchymal abnormality identified. Spleen:  Within normal limits in size and appearance. Adrenals/Urinary Tract: No masses identified. Numerous bilateral nonenhancing renal cysts, at least 1 demonstrating intrinsic T1 hyperintensity consistent with a hemorrhagic or proteinaceous cyst. No evidence of hydronephrosis. Stomach/Bowel: The stomach and small bowel are  unremarkable. Normal appendix. Status post abdominoperineal resection and low midline and sigmoid colostomy. Diverticulosis of the sigmoid colon remainder. Vascular/Lymphatic: No pathologically enlarged lymph nodes identified. No abdominal aortic aneurysm demonstrated. Reproductive: Status post hysterectomy. Other:  Trace fluid in the low pelvis. Musculoskeletal: No suspicious bone lesions identified. IMPRESSION: 1. Mild intrahepatic biliary ductal dilatation status post cholecystectomy. The common bile duct is nondilated measuring up to 6 mm. No mass or suspicious contrast enhancement in the liver to explain biliary ductal dilatation. 2. Pancreatic duct is prominent along its length, measuring up to approximately 5 mm. No obstructing mass or lesion identified to the ampulla. 3. Postoperative findings of abdominoperineal resection and sigmoid end colostomy. No evidence of malignant recurrence in the pelvis. 4. Trace, nonspecific presacral fluid in the low pelvis as seen on prior CT. Electronically Signed   By: AEddie CandleM.D.   On: 03/17/2020 10:10   ECHOCARDIOGRAM COMPLETE  Result Date: 01/14/2020    ECHOCARDIOGRAM REPORT   Patient Name:   KBOBIE CARISDate of Exam: 01/13/2020 Medical Rec #:  [id number]     Height:       65.0 in Accession #:    [id number]    Weight:       172.0 lb Date of Birth:  [birth year]     BSA:          1.855 m Patient Age:    89years       BP:           129/68 mmHg Patient Gender: F              HR:           79 bpm. Exam Location:  [facility] Procedure: 2D Echo, Cardiac Doppler and Color Doppler Indications:  I26.99 Pulmonary Embolus  History:         Patient has no prior history of Echocardiogram examinations.                  Risk Factors:Hypertension and Dyslipidemia. Lower extremity                  edema.  Sonographer:     Wilford Sports Rodgers-Jones Referring Phys:  Unknown Foley NIU Diagnosing Phys: Nelva Bush MD IMPRESSIONS  1. Left ventricular ejection fraction, by estimation,  is 60 to 65%. The left ventricle has normal function. The left ventricle has no regional wall motion abnormalities. There is mild left ventricular hypertrophy. Left ventricular diastolic parameters are consistent with Grade I diastolic dysfunction (impaired relaxation).  2. Right ventricular systolic function is normal. The right ventricular size is normal. Tricuspid regurgitation signal is inadequate for assessing PA pressure.  3. The mitral valve is degenerative. Trivial mitral valve regurgitation. No evidence of mitral stenosis.  4. The aortic valve is tricuspid. Aortic valve regurgitation is not visualized. No aortic stenosis is present. FINDINGS  Left Ventricle: Left ventricular ejection fraction, by estimation, is 60 to 65%. The left ventricle has normal function. The left ventricle has no regional wall motion abnormalities. The left ventricular internal cavity size was normal in size. There is  mild left ventricular hypertrophy. Left ventricular diastolic parameters are consistent with Grade I diastolic dysfunction (impaired relaxation). Right Ventricle: The right ventricular size is normal. No increase in right ventricular wall thickness. Right ventricular systolic function is normal. Tricuspid regurgitation signal is inadequate for assessing PA pressure. Left Atrium: Left atrial size was normal in size. Right Atrium: Right atrial size was normal in size. Pericardium: There is no evidence of pericardial effusion. Mitral Valve: The mitral valve is degenerative in appearance. There is mild thickening of the mitral valve leaflet(s). Mild mitral annular calcification. Trivial mitral valve regurgitation. No evidence of mitral valve stenosis. Tricuspid Valve: The tricuspid valve is normal in structure. Tricuspid valve regurgitation is trivial. Aortic Valve: The aortic valve is tricuspid. Aortic valve regurgitation is not visualized. No aortic stenosis is present. Pulmonic Valve: The pulmonic valve was not well  visualized. Pulmonic valve regurgitation is not visualized. No evidence of pulmonic stenosis. Aorta: The aortic root is normal in size and structure. Pulmonary Artery: The pulmonary artery is of normal size. Venous: The inferior vena cava was not well visualized. IAS/Shunts: The interatrial septum was not well visualized. Additional Comments: A venous catheter is visualized in the right atrium.  LEFT VENTRICLE PLAX 2D LVIDd:         4.10 cm Diastology LVIDs:         2.67 cm LV e' lateral:   8.05 cm/s LV PW:         1.11 cm LV E/e' lateral: 10.7 LV IVS:        1.15 cm LV e' medial:    5.87 cm/s                        LV E/e' medial:  14.6  RIGHT VENTRICLE RV Basal diam:  4.14 cm RV S prime:     17.20 cm/s TAPSE (M-mode): 2.0 cm LEFT ATRIUM             Index       RIGHT ATRIUM           Index LA diam:        4.40 cm 2.37 cm/m  RA Area:     13.40 cm LA Vol (A2C):   33.0 ml 17.79 ml/m RA Volume:   36.20 ml  19.51 ml/m LA Vol (A4C):   47.1 ml 25.39 ml/m LA Biplane Vol: 42.2 ml 22.75 ml/m   AORTA Ao Root diam: 2.90 cm MITRAL VALVE MV Area (PHT): 2.62 cm MV Decel Time: 289 msec MV E velocity: 85.90 cm/s MV A velocity: 109.00 cm/s MV E/A ratio:  0.79 Harrell Gave End MD Electronically signed by Nelva Bush MD Signature Date/Time: 01/14/2020/6:41:59 AM    Final     ASSESSMENT & PLAN:  1. Rectal cancer ([place])   2. Current use of long term anticoagulation   3. History of pulmonary embolism   4. History of DVT (deep vein thrombosis)   Cancer Staging Rectal cancer [facility]) Staging form: Colon and Rectum, AJCC 8th Edition - Clinical stage from 01/23/2019: Stage IIIC (cT4b, cN1a, cM0) - Signed by Earlie Server, MD on 01/23/2019 - Pathologic stage from 10/06/2019: Stage IIC (ypT4b, pN0, cM0) - Signed by Earlie Server, MD on 10/06/2019  #Stage IIIC Rectal cancer Labs are reviewed and discussed with patient. Rising CEA with no recurrence or evidence on CT scan.  Increased intrahepatic bilary ductal distension.  Further MRI  live and MRI pelvis images were reviewed.  No mass or suspicious enhancement in the liver or pancrease.  Post op finding, no evidence of recurrence.  Image findings were discussed with patient.  Today's CEA is pending.  Follow-up in 3 months  #History of acute pulmonary embolism, acute bilateral proximal DVT, unprovoked Continue Eliquis '5mg'$  BID.  Recommend patient to continue current regimen and finish total 6 months of anticoagulation-September 2021.  May consider maintenance anticoagulation at that point. #Leukopenia, mild.  Improved.   Follow-up  3 months.   Earlie Server, MD, PhD

## 2020-03-20 LAB — CEA: CEA: 193 ng/mL — ABNORMAL HIGH (ref 0.0–4.7)

## 2020-03-23 ENCOUNTER — Telehealth: Payer: Self-pay | Admitting: Certified Nurse Midwife

## 2020-03-23 NOTE — Telephone Encounter (Signed)
Called pt [facility] for reschedule. Advised pt to call the office. Taking pt off list

## 2020-03-29 ENCOUNTER — Other Ambulatory Visit (HOSPITAL_COMMUNITY)
Admission: RE | Admit: 2020-03-29 | Discharge: 2020-03-29 | Disposition: A | Discharge status: Home / Self Care | Payer: Medicare Other | Source: Ambulatory Visit | Attending: Certified Nurse Midwife | Admitting: Certified Nurse Midwife

## 2020-03-29 ENCOUNTER — Ambulatory Visit (INDEPENDENT_AMBULATORY_CARE_PROVIDER_SITE_OTHER): Payer: Medicare Other | Admitting: Certified Nurse Midwife

## 2020-03-29 ENCOUNTER — Encounter: Payer: Self-pay | Admitting: Certified Nurse Midwife

## 2020-03-29 ENCOUNTER — Other Ambulatory Visit: Payer: Self-pay

## 2020-03-29 VITALS — BP 117/67 | HR 86 | Ht 65.0 in | Wt 170.5 lb

## 2020-03-29 DIAGNOSIS — N898 Other specified noninflammatory disorders of vagina: Secondary | ICD-10-CM | POA: Insufficient documentation

## 2020-03-29 DIAGNOSIS — R399 Unspecified symptoms and signs involving the genitourinary system: Secondary | ICD-10-CM

## 2020-03-29 DIAGNOSIS — R82998 Other abnormal findings in urine: Secondary | ICD-10-CM

## 2020-03-29 LAB — POCT URINALYSIS DIPSTICK
Bilirubin, UA: NEGATIVE
Glucose, UA: NEGATIVE
Ketones, UA: NEGATIVE
Nitrite, UA: NEGATIVE
Protein, UA: NEGATIVE
Spec Grav, UA: 1.01 (ref 1.010–1.025)
Urobilinogen, UA: 0.2 E.U./dL
pH, UA: 6.5 (ref 5.0–8.0)

## 2020-03-29 NOTE — Patient Instructions (Signed)
Vaginitis  Vaginitis is irritation and swelling (inflammation) of the vagina. It happens when normal bacteria and yeast in the vagina grow too much. There are many types of this condition. Treatment will depend on the type you have. Follow these instructions at home: Lifestyle  Keep your vagina area clean and dry. ? Avoid using soap. ? Rinse the area with water.  Do not do the following until your doctor says it is okay: ? Wash and clean out the vagina (douche). ? Use tampons. ? Have sex.  Wipe from front to back after going to the bathroom.  Let air reach your vagina. ? Wear cotton underwear. ? Do not wear:  Underwear while you sleep.  Tight pants.  Thong underwear.  Underwear or nylons without a cotton panel. ? Take off any wet clothing, such as bathing suits, as soon as possible.  Use gentle, non-scented products. Do not use things that can irritate the vagina, such as fabric softeners. Avoid the following products if they are scented: ? Feminine sprays. ? Detergents. ? Tampons. ? Feminine hygiene products. ? Soaps or bubble baths.  Practice safe sex and use condoms. General instructions  Take over-the-counter and prescription medicines only as told by your doctor.  If you were prescribed an antibiotic medicine, take or use it as told by your doctor. Do not stop taking or using the antibiotic even if you start to feel better.  Keep all follow-up visits as told by your doctor. This is important. Contact a doctor if:  You have pain in your belly.  You have a fever.  Your symptoms last for more than 2-3 days. Get help right away if:  You have a fever and your symptoms get worse all of a sudden. Summary  Vaginitis is irritation and swelling of the vagina. It can happen when the normal bacteria and yeast in the vagina grow too much. There are many types.  Treatment will depend on the type you have.  Do not douche, use tampons , or have sex until your health  care provider approves. When you can return to sex, practice safe sex and use condoms. This information is not intended to replace advice given to you by your health care provider. Make sure you discuss any questions you have with your health care provider. Document Revised: 09/14/2017 Document Reviewed: 10/24/2016 Elsevier Patient Education  2020 [employer]vier Inc.  

## 2020-03-29 NOTE — Progress Notes (Signed)
GYN ENCOUNTER NOTE  Subjective:       Jacqueline Yoder is a 74 y.o. [id number] female is here for gynecologic evaluation of the following issues:  1. Vaginal burning  2. Vaginal irritation  Reports intermittent vaginal burning and irritation since surgery in December, seen by oncologist for evaluation of symptoms; advised to follow up with [place] if persisted.   Notes decreased symptoms with increased water intake.     Denies difficulty breathing or respiratory distress, chest pain, abdominal pain, excessive vaginal bleeding, dysuria, leg pain or swelling   Gynecologic History  No LMP recorded. Patient has had a hysterectomy.   Contraception: status post hysterectomy   Last mammogram: 02/28/2018. Results were: BI-RADS CAT. 1: Negative  Obstetric History  OB History  Gravida Para Term Preterm AB Living  6 4 4   1 4   SAB TAB Ectopic Multiple Live Births  1            # Outcome Date GA Lbr Len/2nd Weight Sex Delivery Anes PTL Lv  6 Gravida           5 Term           4 Term           3 Term           2 Term           1 SAB             Obstetric Comments  Menstrual age: 26    Age 1st Pregnancy: 89    Past Medical History:  Diagnosis Date  . Arthritis   . Cancer ([place])    rectal  . GERD (gastroesophageal reflux disease)   . Hypercholesteremia   . Hypertension   . Hypertension   . Lower extremity edema   . Urinary incontinence     Past Surgical History:  Procedure Laterality Date  . CHOLECYSTECTOMY  1971  . COLONOSCOPY WITH PROPOFOL N/A 12/03/2018   Procedure: COLONOSCOPY WITH PROPOFOL;  Surgeon: Lucilla Lame, MD;  Location: [facility] ENDOSCOPY;  Service: Endoscopy;  Laterality: N/A;  . FLEXIBLE SIGMOIDOSCOPY N/A 12/06/2018   Procedure: FLEXIBLE SIGMOIDOSCOPY;  Surgeon: [name], MD;  Location[facility] ENDOSCOPY;  Service: Endoscopy;  Laterality: N/A;  . LAPAROSCOPIC COLOSTOMY  01/06/2019  . PORTACATH PLACEMENT N/A 04/03/2019   Procedure: INSERTION PORT-A-CATH;  Surgeon:  Jules Husbands, MD;  Location: [facility] ORS;  Service: General;  Laterality: N/A;    Current Outpatient Medications on File Prior to Visit  Medication Sig Dispense Refill  . apixaban (ELIQUIS) 5 MG TABS tablet Take 1 tablet (5 mg total) by mouth 2 (two) times daily. 60 tablet 0  . chlorhexidine (PERIDEX) 0.12 % solution Use as directed 15 mLs in the mouth or throat 2 (two) times daily. 473 mL 3  . Cholecalciferol (VITAMIN D3) 2000 units capsule Take 2,000 Units by mouth daily.    . diclofenac sodium (VOLTAREN) 1 % GEL Apply 2 g topically 4 (four) times daily as needed (joint pain).   11  . docusate sodium (COLACE) 100 MG capsule Take 100 mg by mouth 2 (two) times daily.    . fexofenadine (ALLEGRA) 180 MG tablet Take 180 mg by mouth daily.    Marland Kitchen FIBER ADULT GUMMIES PO Take 1 Dose by mouth daily.    . fluticasone (FLONASE) 50 MCG/ACT nasal spray Place 1 spray into both nostrils 2 (two) times a day.     . gabapentin (NEURONTIN) 100 MG capsule Take 1  capsule (100 mg total) by mouth at bedtime. 90 capsule 0  . HYDROcodone-acetaminophen (NORCO/VICODIN) 5-325 MG tablet Take 1 tablet by mouth every 4 (four) hours as needed for moderate pain. 8 tablet 0  . ipratropium (ATROVENT) 0.03 % nasal spray     . ketorolac (ACULAR) 0.4 % SOLN Place 1 drop into both eyes daily.     Marland Kitchen lidocaine-prilocaine (EMLA) cream Apply 1 application topically as needed. 30 g 6  . loperamide (IMODIUM) 2 MG capsule Take 1 capsule (2 mg total) by mouth See admin instructions. With onset of loose stool, take 4mg  followed by 2mg  every 2 hours,  Maximum: 16 mg/day 120 capsule 1  . Lutein 40 MG CAPS Take 40 mg by mouth daily.     . Lutein-Bilberry (BILBERRY PLUS LUTEIN) 02-999 MCG-MG CAPS Take 1 capsule by mouth daily.     . magic mouthwash w/lidocaine SOLN Take 5 mLs by mouth 4 (four) times daily as needed for mouth pain. Sig: Swish/Swallow 5-10 ml four times a day as needed 480 mL 3  . Multiple Vitamins-Minerals (ONE-A-DAY WOMENS 50  PLUS PO) Take 1 tablet by mouth daily.     . ondansetron (ZOFRAN) 8 MG tablet Take 1 tablet (8 mg total) by mouth every 8 (eight) hours as needed for nausea, vomiting or refractory nausea / vomiting. 90 tablet 1  . pantoprazole (PROTONIX) 40 MG tablet Take 1 tablet (40 mg total) by mouth as needed. 30 tablet 0  . potassium chloride SA (K-DUR) 20 MEQ tablet Take 20 mEq by mouth daily.    . prednisoLONE acetate (PRED FORTE) 1 % ophthalmic suspension Place 1 drop into both eyes 2 (two) times a day.   1  . prochlorperazine (COMPAZINE) 10 MG tablet Take 1 tablet (10 mg total) by mouth every 6 (six) hours as needed (Nausea or vomiting). 30 tablet 1  . simvastatin (ZOCOR) 40 MG tablet Take 40 mg by mouth at bedtime.     . triamterene-hydrochlorothiazide (DYAZIDE) 37.5-25 MG capsule Take 1 capsule by mouth daily.     Verneita Griffes Bark POWD Take by mouth. (Patient not taking: Reported on 03/29/2020)     No current facility-administered medications on file prior to visit.    Allergies  Allergen Reactions  . Sulfamethoxazole-Trimethoprim Other (See Comments)    Also said she had chills and pressure in nose.    Social History   Socioeconomic History  . Marital status: Married    Spouse name: Not on file  . Number of children: Not on file  . Years of education: Not on file  . Highest education level: Not on file  Occupational History  . Not on file  Tobacco Use  . Smoking status: Former Smoker    Quit date: 12/02/1977    Years since quitting: 42.3  . Smokeless tobacco: Never Used  Vaping Use  . Vaping Use: Never used  Substance and Sexual Activity  . Alcohol use: Never  . Drug use: Never  . Sexual activity: Not Currently    Birth control/protection: None  Other Topics Concern  . Not on file  Social History Narrative   Lives with daughter   Social Determinants of Health   Financial Resource Strain:   . Difficulty of Paying Living Expenses:   Food Insecurity:   . Worried About Paediatric nurse in the Last Year:   . Arboriculturist in the Last Year:   Transportation Needs:   . Film/video editor (Medical):   Marland Kitchen  Lack of Transportation (Non-Medical):   Physical Activity:   . Days of Exercise per Week:   . Minutes of Exercise per Session:   Stress:   . Feeling of Stress :   Social Connections:   . Frequency of Communication with Friends and Family:   . Frequency of Social Gatherings with Friends and Family:   . Attends Religious Services:   . Active Member of Clubs or Organizations:   . Attends Archivist Meetings:   Marland Kitchen Marital Status:   Intimate Partner Violence:   . Fear of Current or Ex-Partner:   . Emotionally Abused:   Marland Kitchen Physically Abused:   . Sexually Abused:     Family History  Problem Relation Age of Onset  . Colon cancer Brother   . Prostate cancer Brother   . Hypertension Mother   . Stroke Mother   . Kidney failure Father   . Breast cancer Neg Hx   . Ovarian cancer Neg Hx     The following portions of the patient's history were reviewed and updated as appropriate: allergies, current medications, past family history, past medical history, past social history, past surgical history and problem list.  Review of Systems  ROS: negative except as noted above. Information obtained from patient.   Objective:   BP 117/67   Pulse 86   Ht 5\' 5"  (1.651 m)   Wt 170 lb 8 oz (77.3 kg)   BMI 28.37 kg/m    CONSTITUTIONAL: Well-developed, well-nourished female in no acute distress.   PELVIC:  External Genitalia: Normal, white discharge noted to labia minora.  POCT urinalysis dipstick     Status: Abnormal   Collection Time: 03/29/20 12:13 PM  Result Value Ref Range   Color, UA yellow    Clarity, UA clear    Glucose, UA Negative Negative   Bilirubin, UA neg    Ketones, UA neg    Spec Grav, UA 1.010 1.010 - 1.025   Blood, UA non hem mod    pH, UA 6.5 5.0 - 8.0   Protein, UA Negative Negative   Urobilinogen, UA 0.2 0.2 or 1.0  E.U./dL   Nitrite, UA neg    Leukocytes, UA Moderate (2+) (A) Negative   Appearance yellow;clear    Odor     Assessment:   1. UTI symptoms  - POCT urinalysis dipstick - Urine Culture  2. Vaginal irritation  - Cervicovaginal ancillary only  3. Leukocytes in urine   Plan:   Labs done today: See orders.  Encouraged Vit E oil and coconut oil for irritation  Reviewed red flag symptoms and when to call the office.  RTC if symptoms worsen or fail to improve.  Fransico Him RN [place] 03/30/20 12:42 PM

## 2020-03-29 NOTE — Progress Notes (Signed)
I have seen, interviewed, and examined the patient in conjunction with the [facility] Nurse Practitioner student and affirm the diagnosis and management plan.   [name], CNM [facility], [facility]

## 2020-03-29 NOTE — Progress Notes (Signed)
Pt present due to having burning in the vaginal area for a few months. Pt stated that she noticed the burning about a few months and seen her PCP and was referred to GYN. Pt stated that the burning comes and goes, denies any burning/pain with urination.

## 2020-03-31 LAB — CERVICOVAGINAL ANCILLARY ONLY
Bacterial Vaginitis (gardnerella): NEGATIVE
Candida Glabrata: NEGATIVE
Candida Vaginitis: NEGATIVE
Comment: NEGATIVE
Comment: NEGATIVE
Comment: NEGATIVE

## 2020-03-31 LAB — URINE CULTURE

## 2020-04-05 ENCOUNTER — Ambulatory Visit: Payer: Medicare Other | Admitting: Oncology

## 2020-04-05 ENCOUNTER — Inpatient Hospital Stay: Payer: Medicare Other

## 2020-04-21 ENCOUNTER — Ambulatory Visit: Payer: Medicare Other | Admitting: Gastroenterology

## 2020-04-21 DIAGNOSIS — I700 Atherosclerosis of aorta: Secondary | ICD-10-CM | POA: Insufficient documentation

## 2020-05-24 ENCOUNTER — Inpatient Hospital Stay: Payer: Medicare Other | Attending: Oncology

## 2020-05-24 ENCOUNTER — Other Ambulatory Visit: Payer: Self-pay

## 2020-05-24 DIAGNOSIS — Z452 Encounter for adjustment and management of vascular access device: Secondary | ICD-10-CM | POA: Diagnosis not present

## 2020-05-24 DIAGNOSIS — C20 Malignant neoplasm of rectum: Secondary | ICD-10-CM | POA: Diagnosis not present

## 2020-05-24 DIAGNOSIS — Z95828 Presence of other vascular implants and grafts: Secondary | ICD-10-CM

## 2020-05-24 MED ORDER — HEPARIN SOD (PORK) LOCK FLUSH 100 UNIT/ML IV SOLN
500.0000 [IU] | Freq: Once | INTRAVENOUS | Status: AC
  Administered 2020-05-24: 500 [IU] via INTRAVENOUS
  Filled 2020-05-24: qty 5

## 2020-05-24 MED ORDER — HEPARIN SOD (PORK) LOCK FLUSH 100 UNIT/ML IV SOLN
INTRAVENOUS | Status: AC
  Filled 2020-05-24: qty 5

## 2020-05-24 MED ORDER — SODIUM CHLORIDE 0.9% FLUSH
10.0000 mL | Freq: Once | INTRAVENOUS | Status: AC
  Administered 2020-05-24: 10 mL via INTRAVENOUS
  Filled 2020-05-24: qty 10

## 2020-05-28 ENCOUNTER — Ambulatory Visit: Payer: Medicare Other | Admitting: Certified Nurse Midwife

## 2020-05-28 ENCOUNTER — Encounter: Payer: Self-pay | Admitting: Certified Nurse Midwife

## 2020-05-28 VITALS — BP 106/55 | HR 87 | Ht 65.0 in | Wt 168.6 lb

## 2020-05-28 DIAGNOSIS — N898 Other specified noninflammatory disorders of vagina: Secondary | ICD-10-CM | POA: Diagnosis not present

## 2020-05-28 DIAGNOSIS — Z923 Personal history of irradiation: Secondary | ICD-10-CM | POA: Diagnosis not present

## 2020-05-28 DIAGNOSIS — N952 Postmenopausal atrophic vaginitis: Secondary | ICD-10-CM

## 2020-05-28 NOTE — Patient Instructions (Signed)
Prasterone vaginal insert What is this medicine? PRASTERONE (PRAS ter one), also known as DEHYDROEPIANDROSTERONE (DHEA) is used to treat females who experience painful sexual intercourse, a symptom of menopause that occurs due to changes in and around the vagina. This medicine may be used for other purposes; ask your health care provider or pharmacist if you have questions. COMMON BRAND NAME(S): INTRAROSA What should I tell my health care provider before I take this medicine? They need to know if you have any of these conditions:  cancer, such as breast, uterine, or other cancer  history of vaginal bleeding  an unusual or allergic reaction to prasterone, DHEA, other hormones, medicines, foods, dyes, or preservatives  pregnant or trying to get pregnant  breast-feeding How should I use this medicine? This medicine is for vaginal use only. Do not take by mouth. Follow the directions on the prescription label. Read package directions carefully before using. Wash hands before and after use. Use this medicine at bedtime. Do not use it more often than directed. Do not stop using except on your doctor's advice. Talk to your pediatrician regarding the use of this medicine in children. This medicine is not approved for use in children. Overdosage: If you think you have taken too much of this medicine contact a poison control center or emergency room at once. NOTE: This medicine is only for you. Do not share this medicine with others. What if I miss a dose? If you miss a dose, use it as soon as you can. If it is almost time for your next dose, use only that dose. Do not use double or extra doses. What may interact with this medicine? Interactions are not expected. Do not use any other vaginal products without telling your doctor or health care professional. This list may not describe all possible interactions. Give your health care provider a list of all the medicines, herbs, non-prescription drugs,  or dietary supplements you use. Also tell them if you smoke, drink alcohol, or use illegal drugs. Some items may interact with your medicine. What should I watch for while using this medicine? Visit your doctor or health care professional for a regular check on your progress. This medicine may cause changes on a cervical Pap smear. You will receive regular pelvic exams. What side effects may I notice from receiving this medicine? Side effects that you should report to your doctor or health care professional as soon as possible:  allergic reactions like skin rash, itching or hives Side effects that usually do not require medical attention (report to your doctor or health care professional if they continue or are bothersome):  vaginal discharge This list may not describe all possible side effects. Call your doctor for medical advice about side effects. You may report side effects to FDA at 1-800-FDA-1088. Where should I keep my medicine? Keep out of the reach of children. Store at room temperature or in a refrigerator between 5 and 30 degrees C (41 and 86 degrees F). Throw away any unused medicine after the expiration date. NOTE: This sheet is a summary. It may not cover all possible information. If you have questions about this medicine, talk to your doctor, pharmacist, or health care provider.  2020 Elsevier/Gold Standard (2016-03-28 12:30:18)   Conjugated Estrogens vaginal cream What is this medicine? CONJUGATED ESTROGENS (CON ju gate ed ESS troe jenz) are a mixture of female hormones. This cream can help relieve symptoms associated with menopause.like vaginal dryness and irritation. This medicine may be used for  other purposes; ask your health care provider or pharmacist if you have questions. COMMON BRAND NAME(S): Premarin What should I tell my health care provider before I take this medicine? They need to know if you have any of these conditions:  abnormal vaginal bleeding  blood  vessel disease or blood clots  breast, cervical, endometrial, or uterine cancer  dementia  diabetes  gallbladder disease  heart disease or recent heart attack  high blood pressure  high cholesterol  high level of calcium in the blood  hysterectomy  kidney disease  liver disease  migraine headaches  protein C deficiency  protein S deficiency  stroke  systemic lupus erythematosus (SLE)  tobacco smoker  an unusual or allergic reaction to estrogens other medicines, foods, dyes, or preservatives  pregnant or trying to get pregnant  breast-feeding How should I use this medicine? This medicine is for use in the vagina only. Do not take by mouth. Follow the directions on the prescription label. Use at bedtime unless otherwise directed by your doctor or health care professional. Use the special applicator supplied with the cream. Wash hands before and after use. Fill the applicator with the cream and remove from the tube. Lie on your back, part and bend your knees. Insert the applicator into the vagina and push the plunger to expel the cream into the vagina. Wash the applicator with warm soapy water and rinse well. Use exactly as directed for the complete length of time prescribed. Do not stop using except on the advice of your doctor or health care professional. Talk to your pediatrician regarding the use of this medicine in children. Special care may be needed. A patient package insert for the product will be given with each prescription and refill. Read this sheet carefully each time. The sheet may change frequently. Overdosage: If you think you have taken too much of this medicine contact a poison control center or emergency room at once. NOTE: This medicine is only for you. Do not share this medicine with others. What if I miss a dose? If you miss a dose, use it as soon as you can. If it is almost time for your next dose, use only that dose. Do not use double or extra  doses. What may interact with this medicine? Do not take this medicine with any of the following medications:  aromatase inhibitors like aminoglutethimide, anastrozole, exemestane, letrozole, testolactone This medicine may also interact with the following medications:  barbiturates used for inducing sleep or treating seizures  carbamazepine  grapefruit juice  medicines for fungal infections like itraconazole and ketoconazole  raloxifene or tamoxifen  rifabutin  rifampin  rifapentine  ritonavir  some antibiotics used to treat infections  St. John's Wort  warfarin This list may not describe all possible interactions. Give your health care provider a list of all the medicines, herbs, non-prescription drugs, or dietary supplements you use. Also tell them if you smoke, drink alcohol, or use illegal drugs. Some items may interact with your medicine. What should I watch for while using this medicine? Visit your health care professional for regular checks on your progress. You will need a regular breast and pelvic exam. You should also discuss the need for regular mammograms with your health care professional, and follow his or her guidelines. This medicine can make your body retain fluid, making your fingers, hands, or ankles swell. Your blood pressure can go up. Contact your doctor or health care professional if you feel you are retaining fluid. If  you have any reason to think you are pregnant; stop taking this medicine at once and contact your doctor or health care professional. Tobacco smoking increases the risk of getting a blood clot or having a stroke, especially if you are more than 73 years old. You are strongly advised not to smoke. If you wear contact lenses and notice visual changes, or if the lenses begin to feel uncomfortable, consult your eye care specialist. If you are going to have elective surgery, you may need to stop taking this medicine beforehand. Consult your  health care professional for advice prior to scheduling the surgery. What side effects may I notice from receiving this medicine? Side effects that you should report to your doctor or health care professional as soon as possible:  allergic reactions like skin rash, itching or hives, swelling of the face, lips, or tongue  breast tissue changes or discharge  changes in vision  chest pain  confusion, trouble speaking or understanding  dark urine  general ill feeling or flu-like symptoms  light-colored stools  nausea, vomiting  pain, swelling, warmth in the leg  right upper belly pain  severe headaches  shortness of breath  sudden numbness or weakness of the face, arm or leg  trouble walking, dizziness, loss of balance or coordination  unusual vaginal bleeding  yellowing of the eyes or skin Side effects that usually do not require medical attention (report to your doctor or health care professional if they continue or are bothersome):  hair loss  increased hunger or thirst  increased urination  symptoms of vaginal infection like itching, irritation or unusual discharge  unusually weak or tired This list may not describe all possible side effects. Call your doctor for medical advice about side effects. You may report side effects to FDA at 1-800-FDA-1088. Where should I keep my medicine? Keep out of the reach of children. Store at room temperature between 15 and 30 degrees C (59 and 86 degrees F). Throw away any unused medicine after the expiration date. NOTE: This sheet is a summary. It may not cover all possible information. If you have questions about this medicine, talk to your doctor, pharmacist, or health care provider.  2020 Elsevier/Gold Standard (2011-01-04 09:20:36)   Estradiol vaginal insert (Invexxy) What is this medicine? ESTRADIOL (es tra DYE ole) vaginal insert is used to treat females who experience painful sexual intercourse, a symptom of  menopause that occurs due to changes in and around the vagina. This medicine may be used for other purposes; ask your health care provider or pharmacist if you have questions. COMMON BRAND NAME(S): Imvexxy, Vagifem, Yuvafem What should I tell my health care provider before I take this medicine? They need to know if you have any of these conditions:  abnormal vaginal bleeding  blood vessel disease or blood clots  breast, cervical, endometrial, ovarian, liver, or uterine cancer  dementia  diabetes  gallbladder disease  heart disease or recent heart attack  high blood pressure  high cholesterol  high level of calcium in the blood  hysterectomy  kidney disease  liver disease  migraine headaches  protein C deficiency  protein S deficiency  stroke  systemic lupus erythematosus (SLE)  tobacco smoker  an unusual or allergic reaction to estrogens, other hormones, medicines, foods, dyes, or preservatives  pregnant or trying to get pregnant  breast-feeding How should I use this medicine? This medicine is for vaginal use only. Do not take by mouth. Follow the directions on the prescription label.  Read package directions carefully before using. Wash hands before and after use. Use this medicine at bedtime. Do not use it more often than directed. Do not stop using except on your doctor's advice. Talk to your pediatrician regarding the use of this medicine in children. This medicine is not approved for use in children. Overdosage: If you think you have taken too much of this medicine contact a poison control center or emergency room at once. NOTE: This medicine is only for you. Do not share this medicine with others. What if I miss a dose? If you miss a dose, use it as soon as you can. If it is almost time for your next dose, use only that dose. Do not use double or extra doses. What may interact with this medicine? Do not take this medicine with any of the following  medications:  aromatase inhibitors like aminoglutethimide, anastrozole, exemestane, letrozole, testolactone This medicine may also interact with the following medications:  antibiotics used to treat tuberculosis like rifabutin, rifampin and rifapentene  raloxifene or tamoxifen  warfarin This list may not describe all possible interactions. Give your health care provider a list of all the medicines, herbs, non-prescription drugs, or dietary supplements you use. Also tell them if you smoke, drink alcohol, or use illegal drugs. Some items may interact with your medicine. What should I watch for while using this medicine? Visit your health care professional for regular checks on your progress. You will need a regular breast and pelvic exam. You should also discuss the need for regular mammograms with your health care professional, and follow his or her guidelines. This medicine can make your body retain fluid, making your fingers, hands, or ankles swell. Your blood pressure can go up. Contact your doctor or health care professional if you feel you are retaining fluid. Tobacco smoking increases the risk of getting a blood clot or having a stroke, especially if you are more than 73 years old. You are strongly advised not to smoke. If you wear contact lenses and notice visual changes, or if the lenses begin to feel uncomfortable, consult your eye care specialist. If you are going to have elective surgery, you may need to stop taking this medicine beforehand. Consult your health care professional for advice prior to scheduling the surgery. If you have any reason to think you are pregnant; stop taking this medicine at once and contact your doctor or health care professional. What side effects may I notice from receiving this medicine? Side effects that you should report to your doctor or health care professional as soon as possible:  allergic reactions like skin rash, itching or hives, swelling of the  face, lips, or tongue  breast lumps, tissue changes, or discharge  increased blood pressure  signs and symptoms of liver injury like dark yellow or brown urine; general ill feeling or flu-like symptoms; light-colored stools; loss of appetite; nausea; right upper belly pain; unusually weak or tired; yellowing of the eyes or skin  signs and symptoms of a blood clot such as breathing problems; changes in vision or speech; chest pain; severe, sudden headache; pain, swelling, warmth in the leg; trouble speaking; sudden numbness or weakness of the face, arm or leg  swelling in your ankles, legs, or feet  vaginal discharge, itching, or odor  unusual vaginal bleeding Side effects that usually do not require medical attention (report these to your doctor or health care professional if they continue or are bothersome):  breast tenderness or pain  headache  nausea This list may not describe all possible side effects. Call your doctor for medical advice about side effects. You may report side effects to FDA at 1-800-FDA-1088. Where should I keep my medicine? Keep out of the reach of children. Store at room temperature between 15 and 30 degrees C (59 and 86 degrees F). Throw away any unused medicine after the expiration date. NOTE: This sheet is a summary. It may not cover all possible information. If you have questions about this medicine, talk to your doctor, pharmacist, or health care provider.  2020 Elsevier/Gold Standard (2017-03-23 14:10:39)   Ospemifene oral tablets What is this medicine? OSPEMIFENE (os PEM i feen) is used to treat vaginal dryness and painful sexual intercourse due to menopause; these symptoms occur due to changes in and around the vagina. This medicine may be used for other purposes; ask your health care provider or pharmacist if you have questions. COMMON BRAND NAME(S): Osphena What should I tell my health care provider before I take this medicine? They need to know  if you have any of these conditions:  cancer, such as breast, uterine, or other cancer  heart disease  history of blood clots  history of stroke  history of vaginal bleeding  liver disease  premenopausal  smoke tobacco  an unusual or allergic reaction to ospemifene, other medicines, foods, dyes, or preservatives  pregnant or trying to get pregnant  breast-feeding How should I use this medicine? Take this medicine by mouth with a glass of water. Take this medicine with food. Follow the directions on the prescription label. Do not take your medicine more often than directed. Do not stop taking except on your doctor's advice. Talk to your pediatrician regarding the use of this medicine in children. This medicine is not approved for use in children. Overdosage: If you think you have taken too much of this medicine contact a poison control center or emergency room at once. NOTE: This medicine is only for you. Do not share this medicine with others. What if I miss a dose? If you miss a dose, take it as soon as you can. If it is almost time for your next dose, take only that dose. Do not take double or extra doses. What may interact with this medicine?  amiodarone  bosentan  carbamazepine  certain medicines for fungal infections like ketoconazole, itraconazole, fluconazole, and voriconazole  certain medicines for HIV or hepatitis  estrogens  glyburide  griseofulvin  mitotane  modafinil  omeprazole  phenobarbital  phenytoin  primidone  raloxifene  rifampin  St. John's wort  tamoxifen  toremifene  warfarin This list may not describe all possible interactions. Give your health care provider a list of all the medicines, herbs, non-prescription drugs, or dietary supplements you use. Also tell them if you smoke, drink alcohol, or use illegal drugs. Some items may interact with your medicine. What should I watch for while using this medicine? Visit your  doctor or health care professional for regular checks on your progress. You will need a regular breast and pelvic exam and Pap smear while on this medicine. You should also discuss the need for regular mammograms with your health care professional, and follow his or her guidelines for these tests. Smoking increases the risk of getting a blood clot or having a stroke while you are taking this medicine. You are strongly advised not to smoke. This medicine does not prevent hot flashes. It may cause hot flashes in some patients. This medicine can increase  the risk of developing a condition (endometrial hyperplasia) that may lead to cancer of the lining of the uterus. Taking progestins, another hormone drug, with this medicine lowers the risk of developing this condition. Therefore, if your uterus has not been removed (by a hysterectomy), your doctor may prescribe a progestin for you to take with this medicine. You should know, however, that taking a progestin may have additional health risks. You should discuss the use of these medicines with your health care professional to determine the benefits and risks for you. If you are going to have surgery, you may need to stop taking this medicine. Consult your health care professional for advice before you schedule the surgery. If you have any reason to think you are pregnant; stop taking this medicine at once and contact your doctor or health care professional. What side effects may I notice from receiving this medicine? Side effects that you should report to your doctor or health care professional as soon as possible:  allergic reactions like skin rash, itching or hives; swelling of the face, lips, or tongue  breathing problems  breast tissue changes or discharge  signs and symptoms of a blood clot such as chest pain; shortness of breath; pain, swelling, or warmth in the leg  signs and symptoms of a stroke like changes in vision; confusion; trouble speaking  or understanding; severe headaches; sudden numbness or weakness of the face, arm or leg; trouble walking; dizziness; loss of balance or coordination  vaginal bleeding Side effects that usually do not require medical attention (report to your doctor or health care professional if they continue or are bothersome):  hot flushes or flashes  increased sweating  muscle cramps  vaginal discharge (white or clear) This list may not describe all possible side effects. Call your doctor for medical advice about side effects. You may report side effects to FDA at 1-800-FDA-1088. Where should I keep my medicine? Keep out of the reach of children. Store at room temperature between 20 and 25 degrees C (68 and 77 degrees F). Protect from light. Keep container tightly closed. Throw away any unused medicine after the expiration date. NOTE: This sheet is a summary. It may not cover all possible information. If you have questions about this medicine, talk to your doctor, pharmacist, or health care provider.  2020 Elsevier/Gold Standard (2017-11-19 13:06:09)   Atrophic Vaginitis Atrophic vaginitis is a condition in which the tissues that line the vagina become dry and thin. This condition occurs in women who have stopped having their period. It is caused by a drop in a female hormone (estrogen). This hormone helps:  To keep the vagina moist.  To make a clear fluid. This clear fluid helps: ? To make the vagina ready for sex. ? To protect the vagina from infection. If the lining of the vagina is dry and thin, it may cause irritation, burning, or itchiness. It may also:  Make sex painful.  Make an exam of your vagina painful.  Cause bleeding.  Make you lose interest in sex.  Cause a burning feeling when you pee (urinate).  Cause a brown or yellow fluid to come from your vagina. Some women do not have symptoms. Follow these instructions at home: Medicines  Take over-the-counter and prescription  medicines only as told by your doctor.  Do not use herbs or other medicines unless your doctor says it is okay.  Use medicines for for dryness. These include: ? Oils to make the vagina soft. ? Creams. ?  Moisturizers. General instructions  Do not douche.  Do not use products that can make your vagina dry. These include: ? Scented sprays. ? Scented tampons. ? Scented soaps.  Sex can help increase blood flow and soften the tissue in the vagina. If it hurts to have sex: ? Tell your partner. ? Use products to make sex more comfortable. Use these only as told by your doctor. Contact a doctor if you:  Have discharge from the vagina that is different than usual.  Have a bad smell coming from your vagina.  Have new symptoms.  Do not get better.  Get worse. Summary  Atrophic vaginitis is a condition in which the lining of the vagina becomes dry and thin.  This condition affects women who have stopped having their periods.  Treatment may include using products that help make the vagina soft.  Call a doctor if do not get better with treatment. This information is not intended to replace advice given to you by your health care provider. Make sure you discuss any questions you have with your health care provider. Document Revised: 10/15/2017 Document Reviewed: 10/15/2017 Elsevier Patient Education  2020 [employer].

## 2020-05-28 NOTE — Progress Notes (Signed)
GYN ENCOUNTER NOTE  Subjective:       Jacqueline Yoder is a 74 y.o. [id number] female is here for gynecologic evaluation of the following issues:  1. Vaginal irritation and spotting, no relief after treatment of yeast infection  Patient completed chemotherapy and radiation therapy for treatment of rectal cancer.   Denies difficulty breathing or respiratory distress, chest pain, abdominal pain, dysuria, and leg pain or swelling.      Gynecologic History  No LMP recorded. Patient has had a hysterectomy.  Contraception: status post hysterectomy  Last mammogram: 02/28/2018. Results were: normal  Obstetric History  OB History  Gravida Para Term Preterm AB Living  6 4 4   1 4   SAB TAB Ectopic Multiple Live Births  1            # Outcome Date GA Lbr Len/2nd Weight Sex Delivery Anes PTL Lv  6 Gravida           5 Term           4 Term           3 Term           2 Term           1 SAB             Obstetric Comments  Menstrual age: 64    Age 1st Pregnancy: 35    Past Medical History:  Diagnosis Date  . Arthritis   . Cancer ([place])    rectal  . GERD (gastroesophageal reflux disease)   . Hypercholesteremia   . Hypertension   . Hypertension   . Lower extremity edema   . Urinary incontinence     Past Surgical History:  Procedure Laterality Date  . CHOLECYSTECTOMY  1971  . COLONOSCOPY WITH PROPOFOL N/A 12/03/2018   Procedure: COLONOSCOPY WITH PROPOFOL;  Surgeon: Lucilla Lame, MD;  Location: [facility] ENDOSCOPY;  Service: Endoscopy;  Laterality: N/A;  . FLEXIBLE SIGMOIDOSCOPY N/A 12/06/2018   Procedure: FLEXIBLE SIGMOIDOSCOPY;  Surgeon: [name], MD;  Location: [facility] ENDOSCOPY;  Service: Endoscopy;  Laterality: N/A;  . LAPAROSCOPIC COLOSTOMY  01/06/2019  . PORTACATH PLACEMENT N/A 04/03/2019   Procedure: INSERTION PORT-A-CATH;  Surgeon: Jules Husbands, MD;  Location: [facility] ORS;  Service: General;  Laterality: N/A;    Current Outpatient Medications on File Prior to Visit  Medication  Sig Dispense Refill  . apixaban (ELIQUIS) 5 MG TABS tablet Take 1 tablet (5 mg total) by mouth 2 (two) times daily. 60 tablet 0  . chlorhexidine (PERIDEX) 0.12 % solution Use as directed 15 mLs in the mouth or throat 2 (two) times daily. 473 mL 3  . Cholecalciferol (VITAMIN D3) 2000 units capsule Take 2,000 Units by mouth daily.    Marland Kitchen docusate sodium (COLACE) 100 MG capsule Take 100 mg by mouth 2 (two) times daily.    . fexofenadine (ALLEGRA) 180 MG tablet Take 180 mg by mouth daily.    . fluticasone (FLONASE) 50 MCG/ACT nasal spray Place 1 spray into both nostrils 2 (two) times a day.     . ipratropium (ATROVENT) 0.03 % nasal spray     . lidocaine-prilocaine (EMLA) cream Apply 1 application topically as needed. 30 g 6  . Lutein 40 MG CAPS Take 40 mg by mouth daily.     . Lutein-Bilberry (BILBERRY PLUS LUTEIN) 02-999 MCG-MG CAPS Take 1 capsule by mouth daily.     . magic mouthwash w/lidocaine SOLN Take 5 mLs by mouth 4 (  four) times daily as needed for mouth pain. Sig: Swish/Swallow 5-10 ml four times a day as needed 480 mL 3  . Multiple Vitamins-Minerals (ONE-A-DAY WOMENS 50 PLUS PO) Take 1 tablet by mouth daily.     . potassium chloride SA (K-DUR) 20 MEQ tablet Take 20 mEq by mouth daily.    Marland Kitchen triamterene-hydrochlorothiazide (DYAZIDE) 37.5-25 MG capsule Take 1 capsule by mouth daily.     Verneita Griffes Bark POWD Take by mouth. (Patient not taking: Reported on 03/29/2020)    . diclofenac sodium (VOLTAREN) 1 % GEL Apply 2 g topically 4 (four) times daily as needed (joint pain).   11  . FIBER ADULT GUMMIES PO Take 1 Dose by mouth daily.    Marland Kitchen gabapentin (NEURONTIN) 100 MG capsule Take 1 capsule (100 mg total) by mouth at bedtime. (Patient not taking: Reported on 05/28/2020) 90 capsule 0  . HYDROcodone-acetaminophen (NORCO/VICODIN) 5-325 MG tablet Take 1 tablet by mouth every 4 (four) hours as needed for moderate pain. (Patient not taking: Reported on 05/28/2020) 8 tablet 0  . ketorolac (ACULAR) 0.4 % SOLN  Place 1 drop into both eyes daily.  (Patient not taking: Reported on 05/28/2020)    . loperamide (IMODIUM) 2 MG capsule Take 1 capsule (2 mg total) by mouth See admin instructions. With onset of loose stool, take 4mg  followed by 2mg  every 2 hours,  Maximum: 16 mg/day (Patient not taking: Reported on 05/28/2020) 120 capsule 1  . ondansetron (ZOFRAN) 8 MG tablet Take 1 tablet (8 mg total) by mouth every 8 (eight) hours as needed for nausea, vomiting or refractory nausea / vomiting. (Patient not taking: Reported on 05/28/2020) 90 tablet 1  . pantoprazole (PROTONIX) 40 MG tablet Take 1 tablet (40 mg total) by mouth as needed. (Patient not taking: Reported on 05/28/2020) 30 tablet 0  . prednisoLONE acetate (PRED FORTE) 1 % ophthalmic suspension Place 1 drop into both eyes 2 (two) times a day.   1  . prochlorperazine (COMPAZINE) 10 MG tablet Take 1 tablet (10 mg total) by mouth every 6 (six) hours as needed (Nausea or vomiting). 30 tablet 1  . simvastatin (ZOCOR) 40 MG tablet Take 40 mg by mouth at bedtime.      No current facility-administered medications on file prior to visit.    Allergies  Allergen Reactions  . Sulfamethoxazole-Trimethoprim Other (See Comments)    Also said she had chills and pressure in nose.    Social History   Socioeconomic History  . Marital status: Married    Spouse name: Not on file  . Number of children: Not on file  . Years of education: Not on file  . Highest education level: Not on file  Occupational History  . Not on file  Tobacco Use  . Smoking status: Former Smoker    Quit date: 12/02/1977    Years since quitting: 42.5  . Smokeless tobacco: Never Used  Vaping Use  . Vaping Use: Never used  Substance and Sexual Activity  . Alcohol use: Never  . Drug use: Never  . Sexual activity: Not Currently    Birth control/protection: None  Other Topics Concern  . Not on file  Social History Narrative   Lives with daughter   Social Determinants of Health    Financial Resource Strain:   . Difficulty of Paying Living Expenses:   Food Insecurity:   . Worried About Charity fundraiser in the Last Year:   . [place] in the Last  Year:   Transportation Needs:   . Film/video editor (Medical):   Marland Kitchen Lack of Transportation (Non-Medical):   Physical Activity:   . Days of Exercise per Week:   . Minutes of Exercise per Session:   Stress:   . Feeling of Stress :   Social Connections:   . Frequency of Communication with Friends and Family:   . Frequency of Social Gatherings with Friends and Family:   . Attends Religious Services:   . Active Member of Clubs or Organizations:   . Attends Archivist Meetings:   Marland Kitchen Marital Status:   Intimate Partner Violence:   . Fear of Current or Ex-Partner:   . Emotionally Abused:   Marland Kitchen Physically Abused:   . Sexually Abused:     Family History  Problem Relation Age of Onset  . Colon cancer Brother   . Prostate cancer Brother   . Hypertension Mother   . Stroke Mother   . Kidney failure Father   . Breast cancer Neg Hx   . Ovarian cancer Neg Hx     The following portions of the patient's history were reviewed and updated as appropriate: allergies, current medications, past family history, past medical history, past social history, past surgical history and problem list.  Review of Systems  ROS negative except as noted above. Information obtained from patient.   Objective:   BP (!) 106/55   Pulse 87   Ht 5\' 5"  (1.651 m)   Wt 168 lb 9.6 oz (76.5 kg)   BMI 28.06 kg/m    CONSTITUTIONAL: Well-developed, well-nourished female in no acute distress.   PELVIC:  External Genitalia: Dry, pale skin, spotting present  Vagina: Dry, pale, shrunken skin, spotting present upon insertion of speculum   MUSCULOSKELETAL: Normal range of motion. No tenderness.  No cyanosis, clubbing, or edema.   Assessment:   1. Vaginal irritation   2. Status post radiation therapy   3. Vaginal  atrophy    Plan:   Discussed symptoms management options, see AVS.   Discouraged use of Vaseline.   Reviewed red flag symptoms and when to call.   RTC if symptoms worsen or fail to improve.   Dr. Marcelline Mates consulted and collaborated with CNM regarding patient assessment and plan of care.    Dani Gobble, CNM [facility], [facility] 05/28/20 5:35 PM

## 2020-06-02 ENCOUNTER — Telehealth: Payer: Self-pay | Admitting: Certified Nurse Midwife

## 2020-06-02 NOTE — Telephone Encounter (Signed)
Please advise 

## 2020-06-02 NOTE — Telephone Encounter (Signed)
Pt called in and stated that she received her AVS in the mail and that she read over it and that she wants to try the ospemifene oral tablets, and the cream if you think she needs to cream she said that will be fine. The pt uses [facility] on [address]. Please advise

## 2020-06-04 ENCOUNTER — Other Ambulatory Visit: Payer: Self-pay | Admitting: Oncology

## 2020-06-04 ENCOUNTER — Other Ambulatory Visit: Payer: Self-pay | Admitting: Certified Nurse Midwife

## 2020-06-04 ENCOUNTER — Telehealth: Payer: Self-pay

## 2020-06-04 DIAGNOSIS — N898 Other specified noninflammatory disorders of vagina: Secondary | ICD-10-CM

## 2020-06-04 DIAGNOSIS — N952 Postmenopausal atrophic vaginitis: Secondary | ICD-10-CM

## 2020-06-04 MED ORDER — OSPHENA 60 MG PO TABS: 1.0000 | ORAL_TABLET | Freq: Every day | ORAL | 2 refills | Status: DC

## 2020-06-04 NOTE — Telephone Encounter (Signed)
Call received from patient regarding last message dated 06/02/20. Please advise.

## 2020-06-04 NOTE — Telephone Encounter (Signed)
Please contact patient. Prescription sent to pharmacy on file. Encourage to go online an print co-pay card for discount since medication may not be fully covered by insurance. Thanks, Dani Gobble, CNM

## 2020-06-04 NOTE — Telephone Encounter (Signed)
Called and spoke to patient informing her that her prescription was sent to her pharmacy on file. Pt encouraged to go online and print co-pay card for discount since the medication may not be fully covered by insurance per Dani Gobble, CNM. Pt understood.

## 2020-06-04 NOTE — Progress Notes (Signed)
Rx: Osphena, see orders.    Dani Gobble, CNM [facility], [facility] 06/04/20 10:49 AM

## 2020-06-23 ENCOUNTER — Other Ambulatory Visit: Payer: Self-pay

## 2020-06-23 ENCOUNTER — Telehealth: Payer: Self-pay

## 2020-06-23 ENCOUNTER — Encounter: Payer: Self-pay | Admitting: Gastroenterology

## 2020-06-23 ENCOUNTER — Inpatient Hospital Stay: Payer: Medicare Other | Attending: Oncology

## 2020-06-23 ENCOUNTER — Ambulatory Visit: Payer: Medicare Other | Admitting: Gastroenterology

## 2020-06-23 VITALS — BP 120/77 | HR 91 | Temp 98.1°F | Ht 65.0 in | Wt 166.5 lb

## 2020-06-23 DIAGNOSIS — I10 Essential (primary) hypertension: Secondary | ICD-10-CM | POA: Insufficient documentation

## 2020-06-23 DIAGNOSIS — R970 Elevated carcinoembryonic antigen [CEA]: Secondary | ICD-10-CM | POA: Insufficient documentation

## 2020-06-23 DIAGNOSIS — Z933 Colostomy status: Secondary | ICD-10-CM | POA: Diagnosis not present

## 2020-06-23 DIAGNOSIS — Z9221 Personal history of antineoplastic chemotherapy: Secondary | ICD-10-CM | POA: Insufficient documentation

## 2020-06-23 DIAGNOSIS — Z7901 Long term (current) use of anticoagulants: Secondary | ICD-10-CM | POA: Insufficient documentation

## 2020-06-23 DIAGNOSIS — Z8042 Family history of malignant neoplasm of prostate: Secondary | ICD-10-CM | POA: Insufficient documentation

## 2020-06-23 DIAGNOSIS — N939 Abnormal uterine and vaginal bleeding, unspecified: Secondary | ICD-10-CM | POA: Diagnosis not present

## 2020-06-23 DIAGNOSIS — Z87891 Personal history of nicotine dependence: Secondary | ICD-10-CM | POA: Insufficient documentation

## 2020-06-23 DIAGNOSIS — Z923 Personal history of irradiation: Secondary | ICD-10-CM | POA: Diagnosis not present

## 2020-06-23 DIAGNOSIS — Z800 Family history of malignant neoplasm of digestive organs: Secondary | ICD-10-CM | POA: Diagnosis not present

## 2020-06-23 DIAGNOSIS — Z86718 Personal history of other venous thrombosis and embolism: Secondary | ICD-10-CM | POA: Insufficient documentation

## 2020-06-23 DIAGNOSIS — K219 Gastro-esophageal reflux disease without esophagitis: Secondary | ICD-10-CM | POA: Diagnosis not present

## 2020-06-23 DIAGNOSIS — Z86711 Personal history of pulmonary embolism: Secondary | ICD-10-CM | POA: Diagnosis not present

## 2020-06-23 DIAGNOSIS — E7800 Pure hypercholesterolemia, unspecified: Secondary | ICD-10-CM | POA: Insufficient documentation

## 2020-06-23 DIAGNOSIS — N898 Other specified noninflammatory disorders of vagina: Secondary | ICD-10-CM | POA: Insufficient documentation

## 2020-06-23 DIAGNOSIS — Z9079 Acquired absence of other genital organ(s): Secondary | ICD-10-CM | POA: Insufficient documentation

## 2020-06-23 DIAGNOSIS — C774 Secondary and unspecified malignant neoplasm of inguinal and lower limb lymph nodes: Secondary | ICD-10-CM | POA: Insufficient documentation

## 2020-06-23 DIAGNOSIS — Z8249 Family history of ischemic heart disease and other diseases of the circulatory system: Secondary | ICD-10-CM | POA: Diagnosis not present

## 2020-06-23 DIAGNOSIS — C20 Malignant neoplasm of rectum: Secondary | ICD-10-CM

## 2020-06-23 DIAGNOSIS — Z79899 Other long term (current) drug therapy: Secondary | ICD-10-CM | POA: Diagnosis not present

## 2020-06-23 DIAGNOSIS — Z90710 Acquired absence of both cervix and uterus: Secondary | ICD-10-CM | POA: Diagnosis not present

## 2020-06-23 DIAGNOSIS — Z90722 Acquired absence of ovaries, bilateral: Secondary | ICD-10-CM | POA: Insufficient documentation

## 2020-06-23 DIAGNOSIS — D649 Anemia, unspecified: Secondary | ICD-10-CM

## 2020-06-23 LAB — CBC WITH DIFFERENTIAL/PLATELET
Abs Immature Granulocytes: 0.01 10*3/uL (ref 0.00–0.07)
Basophils Absolute: 0 10*3/uL (ref 0.0–0.1)
Basophils Relative: 1 %
Eosinophils Absolute: 0.1 10*3/uL (ref 0.0–0.5)
Eosinophils Relative: 2 %
HCT: 35.6 % — ABNORMAL LOW (ref 36.0–46.0)
Hemoglobin: 11.6 g/dL — ABNORMAL LOW (ref 12.0–15.0)
Immature Granulocytes: 0 %
Lymphocytes Relative: 31 %
Lymphs Abs: 1.3 10*3/uL (ref 0.7–4.0)
MCH: 29.7 pg (ref 26.0–34.0)
MCHC: 32.6 g/dL (ref 30.0–36.0)
MCV: 91.3 fL (ref 80.0–100.0)
Monocytes Absolute: 0.3 10*3/uL (ref 0.1–1.0)
Monocytes Relative: 8 %
Neutro Abs: 2.4 10*3/uL (ref 1.7–7.7)
Neutrophils Relative %: 58 %
Platelets: 231 10*3/uL (ref 150–400)
RBC: 3.9 MIL/uL (ref 3.87–5.11)
RDW: 13.4 % (ref 11.5–15.5)
WBC: 4.1 10*3/uL (ref 4.0–10.5)
nRBC: 0 % (ref 0.0–0.2)

## 2020-06-23 LAB — IRON AND TIBC
Iron: 40 ug/dL (ref 28–170)
Saturation Ratios: 16 % (ref 10.4–31.8)
TIBC: 246 ug/dL — ABNORMAL LOW (ref 250–450)
UIBC: 206 ug/dL

## 2020-06-23 LAB — COMPREHENSIVE METABOLIC PANEL
ALT: 15 U/L (ref 0–44)
AST: 25 U/L (ref 15–41)
Albumin: 4 g/dL (ref 3.5–5.0)
Alkaline Phosphatase: 41 U/L (ref 38–126)
Anion gap: 10 (ref 5–15)
BUN: 10 mg/dL (ref 8–23)
CO2: 30 mmol/L (ref 22–32)
Calcium: 9.1 mg/dL (ref 8.9–10.3)
Chloride: 100 mmol/L (ref 98–111)
Creatinine, Ser: 0.97 mg/dL (ref 0.44–1.00)
GFR calc Af Amer: >60 mL/min (ref >60)
GFR calc non Af Amer: 58 mL/min — ABNORMAL LOW (ref >60)
Glucose, Bld: 114 mg/dL — ABNORMAL HIGH (ref 70–99)
Potassium: 3.7 mmol/L (ref 3.5–5.1)
Sodium: 140 mmol/L (ref 135–145)
Total Bilirubin: 0.4 mg/dL (ref 0.3–1.2)
Total Protein: 7 g/dL (ref 6.5–8.1)

## 2020-06-23 LAB — FERRITIN: Ferritin: 338 ng/mL — ABNORMAL HIGH (ref 11–307)

## 2020-06-23 MED ORDER — NA SULFATE-K SULFATE-MG SULF 17.5-3.13-1.6 GM/177ML PO SOLN
354.0000 mL | Freq: Once | ORAL | 0 refills | Status: AC
Start: 2020-06-23 — End: 2020-06-23

## 2020-06-23 NOTE — Telephone Encounter (Signed)
Dr. Vicente Males is checking with Dr. Tasia Catchings about when patient needs to have colonoscopy. Dr. Tasia Catchings states she needs procedure ASAP. Patient states she gets the Elquis from PCP physician. Will call patient later today to see when we can scheduled patient .  Sent Blood thinner request to PCP office

## 2020-06-23 NOTE — Addendum Note (Signed)
Addended by: Ulyess Blossom L on: 06/23/2020 10:11 AM   Modules accepted: Orders

## 2020-06-23 NOTE — Progress Notes (Signed)
[name] MD, MRCP(U.K) [address]  [place]  [place], [place] [address]  Main: [phone]  Fax: [phone]   Gastroenterology Consultation  Referring Provider:     Kirk Ruths, MD Primary Care Physician:  Kirk Ruths, MD Primary Gastroenterologist:  Dr. [name]  Reason for Consultation:     Elevated CEA , colonoscopy, Rectal cancer        HPI:   Jacqueline Yoder is a 74 y.o. y/o female referred for consultation & management  by Dr. Ouida Sills, Ocie Cornfield, MD  She has a past medical history of hypertension, CKD and diagnosed with rectal adenocarcinoma with metastasis to the endometrium and rectal vagina fistula.  In February 2020 she had a CT scan of the abdomen which showed and left inguinal lymph node that was enlarged, biopsy of which demonstrated adenocarcinoma consistent with colorectal origin.  Colonoscopy at [facility] demonstrated nonobstructing large non-circumferential mass as well as chronic rectovaginal fistula.  Surprisingly the biopsies returned as hyperplastic polyp.  She underwent a repeat sigmoidoscopy by myself for rebiopsy of the rectal mass which demonstrated invasive colorectal adenocarcinoma.  She underwent diverting colostomy in March 2020.  Underwent chemoradiation treatment.  She has her [facility] at [facility].  Apparently she follows with Dr. Tasia Catchings as she prefers to have treatment done locally at [facility] she has been referred back to see me to undergo colonoscopy 1 year after surgery which would be December 2021.  She underwent an APR/posterior vaginectomy/TAH/BSO V-Y flap  This patient has previously not been seen at the office .  She is on Eliquis, recent CEA is 193, hemoglobin 12.7 g and creatinine 1.01 No complaints doing well.   Past Medical History:  Diagnosis Date  . Arthritis   . Cancer ([place])    rectal  . GERD (gastroesophageal reflux disease)   . Hypercholesteremia   . Hypertension   . Hypertension   . Lower extremity edema   . Urinary  incontinence     Past Surgical History:  Procedure Laterality Date  . CHOLECYSTECTOMY  1971  . COLONOSCOPY WITH PROPOFOL N/A 12/03/2018   Procedure: COLONOSCOPY WITH PROPOFOL;  Surgeon: Lucilla Lame, MD;  Location: [facility] ENDOSCOPY;  Service: Endoscopy;  Laterality: N/A;  . FLEXIBLE SIGMOIDOSCOPY N/A 12/06/2018   Procedure: FLEXIBLE SIGMOIDOSCOPY;  Surgeon: [name], MD;  Location: [facility] ENDOSCOPY;  Service: Endoscopy;  Laterality: N/A;  . LAPAROSCOPIC COLOSTOMY  01/06/2019  . PORTACATH PLACEMENT N/A 04/03/2019   Procedure: INSERTION PORT-A-CATH;  Surgeon: Jules Husbands, MD;  Location: [facility] ORS;  Service: General;  Laterality: N/A;    Prior to Admission medications   Medication Sig Start Date End Date Taking? Authorizing Provider  apixaban (ELIQUIS) 5 MG TABS tablet Take 1 tablet (5 mg total) by mouth 2 (two) times daily. 01/21/20   Jennye Boroughs, MD  chlorhexidine (PERIDEX) 0.12 % solution Use as directed 15 mLs in the mouth or throat 2 (two) times daily. 04/23/19   Earlie Server, MD  Cholecalciferol (VITAMIN D3) 2000 units capsule Take 2,000 Units by mouth daily.    [provider]  Cinnamon Bark POWD Take by mouth. Patient not taking: Reported on 03/29/2020    [provider]  diclofenac sodium (VOLTAREN) 1 % GEL Apply 2 g topically 4 (four) times daily as needed (joint pain).  05/09/18   [provider]  docusate sodium (COLACE) 100 MG capsule Take 100 mg by mouth 2 (two) times daily.    [provider]  fexofenadine (ALLEGRA) 180  MG tablet Take 180 mg by mouth daily.    [provider]  FIBER ADULT GUMMIES PO Take 1 Dose by mouth daily.    [provider]  fluticasone (FLONASE) 50 MCG/ACT nasal spray Place 1 spray into both nostrils 2 (two) times a day.  05/07/18   [provider]  gabapentin (NEURONTIN) 100 MG capsule Take 1 capsule (100 mg total) by mouth at bedtime. Patient not taking: Reported on 05/28/2020 10/06/19   Earlie Server, MD    HYDROcodone-acetaminophen (NORCO/VICODIN) 5-325 MG tablet Take 1 tablet by mouth every 4 (four) hours as needed for moderate pain. Patient not taking: Reported on 05/28/2020 04/03/19   Caroleen Hamman F, MD  ipratropium (ATROVENT) 0.03 % nasal spray  11/06/19   [provider]  ketorolac (ACULAR) 0.4 % SOLN Place 1 drop into both eyes daily.  Patient not taking: Reported on 05/28/2020 06/26/18   [provider]  lidocaine-prilocaine (EMLA) cream Apply 1 application topically as needed. 04/03/19   Earlie Server, MD  loperamide (IMODIUM) 2 MG capsule Take 1 capsule (2 mg total) by mouth See admin instructions. With onset of loose stool, take 4mg  followed by 2mg  every 2 hours,  Maximum: 16 mg/day Patient not taking: Reported on 05/28/2020 03/07/19   Earlie Server, MD  Lutein 40 MG CAPS Take 40 mg by mouth daily.     [provider]  Lutein-Bilberry (BILBERRY PLUS LUTEIN) 02-999 MCG-MG CAPS Take 1 capsule by mouth daily.  03/19/17   [provider]  magic mouthwash w/lidocaine SOLN Take 5 mLs by mouth 4 (four) times daily as needed for mouth pain. Sig: Swish/Swallow 5-10 ml four times a day as needed 10/13/19   Earlie Server, MD  Multiple Vitamins-Minerals (ONE-A-DAY WOMENS 50 PLUS PO) Take 1 tablet by mouth daily.     [provider]  ondansetron (ZOFRAN) 8 MG tablet Take 1 tablet (8 mg total) by mouth every 8 (eight) hours as needed for nausea, vomiting or refractory nausea / vomiting. Patient not taking: Reported on 05/28/2020 03/13/19   Earlie Server, MD  Ospemifene [facility]) 60 MG TABS Take 1 tablet by mouth daily. 06/04/20   Lawhorn, Lara Mulch, CNM  pantoprazole (PROTONIX) 40 MG tablet Take 1 tablet (40 mg total) by mouth as needed. Patient not taking: Reported on 05/28/2020 03/13/19   Earlie Server, MD  potassium chloride SA (K-DUR) 20 MEQ tablet Take 20 mEq by mouth daily.    [provider]  prednisoLONE acetate (PRED FORTE) 1 % ophthalmic suspension Place 1 drop into both  eyes 2 (two) times a day.  07/23/18   [provider]  prochlorperazine (COMPAZINE) 10 MG tablet Take 1 tablet (10 mg total) by mouth every 6 (six) hours as needed (Nausea or vomiting). 04/07/19   Earlie Server, MD  simvastatin (ZOCOR) 40 MG tablet Take 40 mg by mouth at bedtime.  08/09/18   [provider]  triamterene-hydrochlorothiazide (DYAZIDE) 37.5-25 MG capsule Take 1 capsule by mouth daily.  10/05/17   [provider]    Family History  Problem Relation Age of Onset  . Colon cancer Brother   . Prostate cancer Brother   . Hypertension Mother   . Stroke Mother   . Kidney failure Father   . Breast cancer Neg Hx   . Ovarian cancer Neg Hx      Social History   Tobacco Use  . Smoking status: Former Smoker    Quit date: 12/02/1977    Years since quitting:  42.5  . Smokeless tobacco: Never Used  Vaping Use  . Vaping Use: Never used  Substance Use Topics  . Alcohol use: Never  . Drug use: Never    Allergies as of 06/23/2020 - Review Complete 05/28/2020  Allergen Reaction Noted  . Sulfamethoxazole-trimethoprim Other (See Comments) 02/12/2015    Review of Systems:    All systems reviewed and negative except where noted in HPI.   Physical Exam:  There were no vitals taken for this visit. No LMP recorded. Patient has had a hysterectomy. Psych:  Alert and cooperative. Normal mood and affect. General:   Alert,  Well-developed, well-nourished, pleasant and cooperative in NAD Head:  Normocephalic and atraumatic. Eyes:  Sclera clear, no icterus.   Conjunctiva pink. Ears:  Normal auditory acuity. Lungs:  Respirations even and unlabored.  Clear throughout to auscultation.   No wheezes, crackles, or rhonchi. No acute distress. Heart:  Regular rate and rhythm; no murmurs, clicks, rubs, or gallops. Abdomen:  Normal bowel sounds.  Left lower quadrant colostomy bag with brown stool no bruits.  Soft, non-tender and non-distended without masses, hepatosplenomegaly or  hernias noted.  No guarding or rebound tenderness.    Neurologic:  Alert and oriented x3;  grossly normal neurologically. Psych:  Alert and cooperative. Normal mood and affect.  Imaging Studies: No results found.  Assessment and Plan:   LEASA KINCANNON is a 74 y.o. y/o female has been referred for a surveillance colonoscopy.  She was diagnosed with rectal cancer in February 2020.  The cancer had involved the endometrium.  Underwent chemoradiation and extensive surgery in December 2020, she will be due for surveillance colonoscopy in December 2021.  Note that the CEA has been elevated.  MRI pelvis has been ordered.  Plan 1.  I have discussed with Dr. Tasia Catchings regarding timing of the surveillance colonoscopy.We will get it sooner than in 09/2020 since CEA is elevated  I have discussed alternative options, risks & benefits,  which include, but are not limited to, bleeding, infection, perforation,respiratory complication & drug reaction.  The patient agrees with this plan & written consent will be obtained.     Follow up in prn   Dr [name] MD,MRCP(U.K)

## 2020-06-23 NOTE — Telephone Encounter (Signed)
Scheduled patient for 07/15/2020. Went over instructions with patient and sent to [employer] and mail

## 2020-06-24 LAB — CEA: CEA: 413 ng/mL — ABNORMAL HIGH (ref 0.0–4.7)

## 2020-06-25 ENCOUNTER — Inpatient Hospital Stay (HOSPITAL_BASED_OUTPATIENT_CLINIC_OR_DEPARTMENT_OTHER): Payer: Medicare Other | Admitting: Oncology

## 2020-06-25 ENCOUNTER — Ambulatory Visit
Admission: RE | Admit: 2020-06-25 | Discharge: 2020-06-25 | Disposition: A | Discharge status: Home / Self Care | Payer: Medicare Other | Source: Ambulatory Visit | Attending: Oncology | Admitting: Oncology

## 2020-06-25 ENCOUNTER — Other Ambulatory Visit: Payer: Self-pay

## 2020-06-25 ENCOUNTER — Encounter: Payer: Self-pay | Admitting: Oncology

## 2020-06-25 VITALS — BP 128/77 | HR 81 | Temp 98.3°F | Resp 16 | Wt 165.3 lb

## 2020-06-25 DIAGNOSIS — N939 Abnormal uterine and vaginal bleeding, unspecified: Secondary | ICD-10-CM

## 2020-06-25 DIAGNOSIS — Z86718 Personal history of other venous thrombosis and embolism: Secondary | ICD-10-CM | POA: Diagnosis not present

## 2020-06-25 DIAGNOSIS — Z7901 Long term (current) use of anticoagulants: Secondary | ICD-10-CM

## 2020-06-25 DIAGNOSIS — C20 Malignant neoplasm of rectum: Secondary | ICD-10-CM | POA: Insufficient documentation

## 2020-06-25 DIAGNOSIS — Z86711 Personal history of pulmonary embolism: Secondary | ICD-10-CM

## 2020-06-25 MED ORDER — IOHEXOL 300 MG/ML  SOLN
85.0000 mL | Freq: Once | INTRAMUSCULAR | Status: AC | PRN
  Administered 2020-06-25: 85 mL via INTRAVENOUS

## 2020-06-25 MED ORDER — APIXABAN 2.5 MG PO TABS
2.5000 mg | ORAL_TABLET | Freq: Two times a day (BID) | ORAL | 3 refills | Status: DC
Start: 2020-06-25 — End: 2020-07-26

## 2020-06-25 NOTE — Progress Notes (Signed)
Patient reports that she has been having burning and bleeding in the vaginal area that has been evaluated by GYN but has not improved.

## 2020-06-25 NOTE — Progress Notes (Signed)
Hematology/Oncology progress note [facility] [phone] [phone]   Patient Care Team: Kirk Ruths, MD as PCP - General (Internal Medicine) Clent Jacks, RN as Registered Nurse  REFERRING PROVIDER: Dr.Secord CHIEF COMPLAINTS/REASON FOR VISIT:  Follow up for rectal cancer  HISTORY OF PRESENTING ILLNESS:  Jacqueline Yoder is a  74 y.o.  female with PMH listed below who was referred to me for evaluation of endometrial cancer.   Patient initially presented with complaints of postmenopausal bleeding on 08/16/2018.  History of was menopausal vaginal bleeding in 2016 which resulted in cervical polypectomy.  Pathology 02/04/2015 showed cervical polyp, consistent with benign endometrial polyp.  [name] after polypectomy due to anxiety associated with pelvic exams.  pelvic exam on 08/16/2018 reviewed cervical abnormality and from enlarged uterus. Seen by Dr. Marcelline Mates on 10/29/2018.  Endometrial biopsy and a Pap smear was performed. 10/29/2018 Pap smear showed adenocarcinoma, favor endometrial origin. 10/29/2018 endometrial biopsy showed endometrioid carcinoma, FIGO grade 1.  10/29/2018- TA & TV Ultrasound revealed: Anteverted uterus measuring 8.7 x 5.6 x 6.4 cm without evidence of focal masses.  The endometrium measuring 24.1 mm (thickened) and heterogeneous.  Right and left ovaries not visualized.  No adnexal masses identified.  No free fluid in cul-de-sac.  Patient was seen by Dr. Theora Gianotti in clinic on 11/13/2018.  Cervical exam reveals 2 cm exophytic irregular mass consistent with malignancy.   11/19/2018 CT chest abdomen pelvis with contrast showed thickened endometrium with some irregularity compatible with the provided diagnosis of endometrial malignancy.  There is a mildly prominent left inguinal node 1.4 cm.  Patient was seen by Dr. Fransisca Connors on 11/20/2018 and left groin lymph node biopsy was recommended.  11/26/2018 patient underwent  left inguinal lymph node biopsy. Pathology showed metastatic adenocarcinoma consistent with colorectal origin.  CDX 2+.  Case was discussed on tumor board.  Recommend colonoscopy for further evaluation.  Patient reports significant weight loss 30 pounds over the last year.  Chronic vaginal spotting. Change of bowel habits the past few months.  More constipated.  Family history positive for brother who has colon cancer prostate cancer.  patient has underwent colonoscopy on 12/03/2018 which reviewed a nonobstructing large mass in the rectum.  Also chronic fistula.  Mass was not circumferential.  This was biopsied with a cold forceps for histology.  Pathology came back hyperplastic polyp negative for dysplasia and malignancy. [name], patient underwent flex sigmoidoscopy on 12/06/2018 with rebiopsy of the rectal mass. This time biopsy results came back positive for invasive colorectal adenocarcinoma, moderately differentiated. Immunotherapy for nearly mismatch repair protein (MMR ) was performed.  There is no loss of MMR expression.  low probability of MSI high.   # Seen by [name] for evaluation of resectability for rectal cancer. In addition, she also had a second opinion with [facility] where her endometrial biopsy pathology was changed to  adenocarcinoma, consistent with colorectal primary.   [name]. She has home health that has been assisting with ostomy care  Patient was also evaluated by [facility] oncology.  Recommendation is to proceed with TNT with concurrent chemoradiation followed by neoadjuvant chemotherapy followed by surgical resection. Patient prefers to have treatment done locally with [facility].   # Oncology Treatment:  02/03/2019- 03/19/2019  concurrent Xeloda and radiation.  Xeloda dose 855m /m2 BID - rounded to 16555mBID- on days of radiation. 04/09/2019, started on FOLFOX with bolus 5-FU omitted.  07/16/2019  finished  8 cycles of FOLFOX.  # Patient has underwent 09/17/19 APR/posterior vaginectomy/TAH/BSO/VY-flap pT4b pN0 with close vaginal margin 0.2 mm.  Uterus and ovaries negative for malignancy. Patient reports bilateral lower extremity numbness and tingling, intermittent, left worse than right. She has lost a lot of weight since her APR surgery.   Surveillance plan Recommend history and physical/tumor marker monitoring every 3 to 6 months for the first 2 years after surgery and then every 6 months for a total of 5 years. Chest abdomen pelvis every 6 to 12 months for total 5 years. Colonoscopy 1 year after surgery, if no advanced adenoma, repeat in 3 years and then 5 years.  #Family history with half brother having's history of colon cancer prostate cancer.  Personal history of colorectal cancer.  Patient has not decided if she wants genetic testing.    INTERVAL HISTORY Jacqueline Yoder is a 74 y.o. female who has above history reviewed by me presents for follow-up of rectal cancer. Patient has no new complaints.  on Eliquis 5 mg twice daily for history of PE in the bilateral lower extremity DVT.   Patient reports vaginal burning and bleeding and has been seen by her GYN.  No relief after treatment of yeast infection. Pelvic examination showed dry vagina, shrunken skin, spotting present upon insertion of speculum.   Patient was last seen by me June 2021 1 and her CEA was found to be progressively increasing.  Patient had MRI abdomen and pelvis done on 03/16/2020 which showed no recurrence disease. Given the rising CEA, patient was recommended to follow-up with gastroenterology for further evaluation.  Patient prefers to be referred to [facility] gastroenterologist and she was referred to see Dr. Vicente Males.  Patient got an appointment in July and she called and canceled appointment.  She recently established care with Dr. Vicente Males in September.  And was planned to have repeat colonoscopy at end of  September.  Reports that her weight is up and down.  Appetite is fair.  No fever or chills.  No abdominal pain.  No concerns about her colostomy bag.  No blood in her bowel movement.   Review of Systems  Constitutional: Negative for appetite change, chills, fatigue, fever and unexpected weight change.  HENT:   Negative for hearing loss and voice change.   Eyes: Negative for eye problems.  Respiratory: Negative for chest tightness and cough.   Cardiovascular: Negative for chest pain.  Gastrointestinal: Negative for abdominal distention, abdominal pain, blood in stool, constipation, diarrhea and nausea.       Colostomy bag  Endocrine: Negative for hot flashes.  Genitourinary: Negative for difficulty urinating and frequency.   Musculoskeletal: Negative for arthralgias.  Skin: Negative for itching and rash.  Neurological: Negative for extremity weakness and numbness.  Hematological: Negative for adenopathy.  Psychiatric/Behavioral: Negative for confusion.    MEDICAL HISTORY:  Past Medical History:  Diagnosis Date  . Arthritis   . GERD (gastroesophageal reflux disease)   . Hypercholesteremia   . Hypertension   . Hypertension   . Lower extremity edema   . Rectal cancer ([place]) 12/2018  . Urinary incontinence     SURGICAL HISTORY: Past Surgical History:  Procedure Laterality Date  . CHOLECYSTECTOMY  1971  . COLONOSCOPY WITH PROPOFOL N/A 12/03/2018   Procedure: COLONOSCOPY WITH PROPOFOL;  Surgeon: Lucilla Lame, MD;  Location: [facility] ENDOSCOPY;  Service: Endoscopy;  Laterality: N/A;  . FLEXIBLE SIGMOIDOSCOPY N/A 12/06/2018   Procedure: FLEXIBLE SIGMOIDOSCOPY;  Surgeon: [name], MD;  Location: [facility]  ENDOSCOPY;  Service: Endoscopy;  Laterality: N/A;  . LAPAROSCOPIC COLOSTOMY  01/06/2019  . PORTACATH PLACEMENT N/A 04/03/2019   Procedure: INSERTION PORT-A-CATH;  Surgeon: Jules Husbands, MD;  Location: [facility] ORS;  Service: General;  Laterality: N/A;    SOCIAL HISTORY: Social History    Socioeconomic History  . Marital status: Married    Spouse name: Not on file  . Number of children: Not on file  . Years of education: Not on file  . Highest education level: Not on file  Occupational History  . Not on file  Tobacco Use  . Smoking status: Former Smoker    Quit date: 12/02/1977    Years since quitting: 42.5  . Smokeless tobacco: Never Used  Vaping Use  . Vaping Use: Never used  Substance and Sexual Activity  . Alcohol use: Never  . Drug use: Never  . Sexual activity: Not Currently    Birth control/protection: None  Other Topics Concern  . Not on file  Social History Narrative   Lives with daughter   Social Determinants of Health   Financial Resource Strain:   . Difficulty of Paying Living Expenses: Not on file  Food Insecurity:   . Worried About Charity fundraiser in the Last Year: Not on file  . Ran Out of Food in the Last Year: Not on file  Transportation Needs:   . Lack of Transportation (Medical): Not on file  . Lack of Transportation (Non-Medical): Not on file  Physical Activity:   . Days of Exercise per Week: Not on file  . Minutes of Exercise per Session: Not on file  Stress:   . Feeling of Stress : Not on file  Social Connections:   . Frequency of Communication with Friends and Family: Not on file  . Frequency of Social Gatherings with Friends and Family: Not on file  . Attends Religious Services: Not on file  . Active Member of Clubs or Organizations: Not on file  . Attends Archivist Meetings: Not on file  . Marital Status: Not on file  Intimate Partner Violence:   . Fear of Current or Ex-Partner: Not on file  . Emotionally Abused: Not on file  . Physically Abused: Not on file  . Sexually Abused: Not on file    FAMILY HISTORY: Family History  Problem Relation Age of Onset  . Colon cancer Brother   . Prostate cancer Brother   . Hypertension Mother   . Stroke Mother   . Kidney failure Father   . Breast cancer Neg Hx    . Ovarian cancer Neg Hx     ALLERGIES:  is allergic to sulfamethoxazole-trimethoprim.  MEDICATIONS:  Current Outpatient Medications  Medication Sig Dispense Refill  . Cholecalciferol (VITAMIN D3) 2000 units capsule Take 2,000 Units by mouth daily.    Verneita Griffes Bark POWD Take by mouth.     . docusate sodium (COLACE) 100 MG capsule Take 100 mg by mouth 2 (two) times daily.    . fexofenadine (ALLEGRA) 180 MG tablet Take 180 mg by mouth daily.    . fluticasone (FLONASE) 50 MCG/ACT nasal spray Place 1 spray into both nostrils 2 (two) times a day.     . lidocaine-prilocaine (EMLA) cream Apply 1 application topically as needed. 30 g 6  . Multiple Vitamins-Minerals (ONE-A-DAY WOMENS 50 PLUS PO) Take 1 tablet by mouth daily.     . potassium chloride SA (K-DUR) 20 MEQ tablet Take 20 mEq by mouth daily.    Marland Kitchen  simvastatin (ZOCOR) 40 MG tablet Take 40 mg by mouth at bedtime.     . triamterene-hydrochlorothiazide (DYAZIDE) 37.5-25 MG capsule Take 1 capsule by mouth daily.     Marland Kitchen apixaban (ELIQUIS) 2.5 MG TABS tablet Take 1 tablet (2.5 mg total) by mouth 2 (two) times daily. 60 tablet 3  . chlorhexidine (PERIDEX) 0.12 % solution Use as directed 15 mLs in the mouth or throat 2 (two) times daily. (Patient not taking: Reported on 06/25/2020) 473 mL 3  . diclofenac sodium (VOLTAREN) 1 % GEL Apply 2 g topically 4 (four) times daily as needed (joint pain).  (Patient not taking: Reported on 06/25/2020)  11  . FIBER ADULT GUMMIES PO Take 1 Dose by mouth daily. (Patient not taking: Reported on 06/25/2020)    . gabapentin (NEURONTIN) 100 MG capsule Take 1 capsule (100 mg total) by mouth at bedtime. (Patient not taking: Reported on 06/25/2020) 90 capsule 0  . HYDROcodone-acetaminophen (NORCO/VICODIN) 5-325 MG tablet Take 1 tablet by mouth every 4 (four) hours as needed for moderate pain. (Patient not taking: Reported on 06/25/2020) 8 tablet 0  . ipratropium (ATROVENT) 0.03 % nasal spray  (Patient not taking: Reported on  06/25/2020)    . ketorolac (ACULAR) 0.4 % SOLN Place 1 drop into both eyes daily.  (Patient not taking: Reported on 06/25/2020)    . loperamide (IMODIUM) 2 MG capsule Take 1 capsule (2 mg total) by mouth See admin instructions. With onset of loose stool, take $RemoveBef'4mg'pkIKvBaCoR$  followed by $RemoveBef'2mg'DcEpFDqsrr$  every 2 hours,  Maximum: 16 mg/day (Patient not taking: Reported on 06/25/2020) 120 capsule 1  . Lutein 40 MG CAPS Take 40 mg by mouth daily.  (Patient not taking: Reported on 06/25/2020)    . Lutein-Bilberry (BILBERRY PLUS LUTEIN) 02-999 MCG-MG CAPS Take 1 capsule by mouth daily.  (Patient not taking: Reported on 06/25/2020)    . magic mouthwash w/lidocaine SOLN Take 5 mLs by mouth 4 (four) times daily as needed for mouth pain. Sig: Swish/Swallow 5-10 ml four times a day as needed (Patient not taking: Reported on 06/25/2020) 480 mL 3  . ondansetron (ZOFRAN) 8 MG tablet Take 1 tablet (8 mg total) by mouth every 8 (eight) hours as needed for nausea, vomiting or refractory nausea / vomiting. (Patient not taking: Reported on 06/25/2020) 90 tablet 1  . Ospemifene (OSPHENA) 60 MG TABS Take 1 tablet by mouth daily. (Patient not taking: Reported on 06/25/2020) 30 tablet 2  . pantoprazole (PROTONIX) 40 MG tablet Take 1 tablet (40 mg total) by mouth as needed. (Patient not taking: Reported on 06/25/2020) 30 tablet 0  . prednisoLONE acetate (PRED FORTE) 1 % ophthalmic suspension Place 1 drop into both eyes 2 (two) times a day.  (Patient not taking: Reported on 06/25/2020)  1  . prochlorperazine (COMPAZINE) 10 MG tablet Take 1 tablet (10 mg total) by mouth every 6 (six) hours as needed (Nausea or vomiting). (Patient not taking: Reported on 06/25/2020) 30 tablet 1   No current facility-administered medications for this visit.     PHYSICAL EXAMINATION: ECOG PERFORMANCE STATUS: 1 - Symptomatic but completely ambulatory Vitals:   06/25/20 1012  BP: 128/77  Pulse: 81  Resp: 16  Temp: 98.3 F (36.8 C)   Filed Weights   06/25/20 1012  Weight:  165 lb 4.8 oz (75 kg)    Physical Exam Constitutional:      General: She is not in acute distress. HENT:     Head: Normocephalic and atraumatic.  Eyes:  General: No scleral icterus. Cardiovascular:     Rate and Rhythm: Normal rate and regular rhythm.     Heart sounds: Normal heart sounds.  Pulmonary:     Effort: Pulmonary effort is normal. No respiratory distress.     Breath sounds: No wheezing.  Abdominal:     General: Bowel sounds are normal. There is no distension.     Palpations: Abdomen is soft.     Comments: + Colostomy bag   Musculoskeletal:        General: No deformity. Normal range of motion.     Cervical back: Normal range of motion and neck supple.  Skin:    General: Skin is warm and dry.     Findings: No erythema or rash.  Neurological:     Mental Status: She is alert and oriented to person, place, and time. Mental status is at baseline.     Cranial Nerves: No cranial nerve deficit.     Coordination: Coordination normal.  Psychiatric:        Mood and Affect: Mood normal.    CMP Latest Ref Rng & Units 06/23/2020  Glucose 70 - 99 mg/dL 114(H)  BUN 8 - 23 mg/dL 10  Creatinine 0.44 - 1.00 mg/dL 0.97  Sodium 135 - 145 mmol/L 140  Potassium 3.5 - 5.1 mmol/L 3.7  Chloride 98 - 111 mmol/L 100  CO2 22 - 32 mmol/L 30  Calcium 8.9 - 10.3 mg/dL 9.1  Total Protein 6.5 - 8.1 g/dL 7.0  Total Bilirubin 0.3 - 1.2 mg/dL 0.4  Alkaline Phos 38 - 126 U/L 41  AST 15 - 41 U/L 25  ALT 0 - 44 U/L 15   CBC Latest Ref Rng & Units 06/23/2020  WBC 4.0 - 10.5 K/uL 4.1  Hemoglobin 12.0 - 15.0 g/dL 11.6(L)  Hematocrit 36 - 46 % 35.6(L)  Platelets 150 - 400 K/uL 231    LABORATORY DATA:  I have reviewed the data as listed Lab Results  Component Value Date   WBC 4.1 06/23/2020   HGB 11.6 (L) 06/23/2020   HCT 35.6 (L) 06/23/2020   MCV 91.3 06/23/2020   PLT 231 06/23/2020   Recent Labs    02/18/20 1339 03/19/20 0826 06/23/20 1124  NA 139 142 140  K 3.7 4.1 3.7  CL 102  102 100  CO2 $Re'28 31 30  'tFR$ GLUCOSE 103* 95 114*  BUN $Re'14 12 10  'pSq$ CREATININE 1.01* 1.01* 0.97  CALCIUM 9.3 9.9 9.1  GFRNONAA 56* 56* 58*  GFRAA >60 >60 >60  PROT 7.0 7.5 7.0  ALBUMIN 4.0 4.4 4.0  AST $Re'22 25 25  'AGp$ ALT $R'14 16 15  'mH$ ALKPHOS 34* 37* 41  BILITOT 0.6 0.8 0.4   Iron/TIBC/Ferritin/ %Sat    Component Value Date/Time   IRON 40 06/23/2020 1124   TIBC 246 (L) 06/23/2020 1124   FERRITIN 338 (H) 06/23/2020 1124   IRONPCTSAT 16 06/23/2020 1124     RADIOGRAPHIC STUDIES: I have personally reviewed the radiological images as listed and agreed with the findings in the report. CT CHEST ABDOMEN PELVIS W CONTRAST  Result Date: 06/25/2020 CLINICAL DATA:  History of rectal cancer and vaginal bleeding with elevated CEA EXAM: CT CHEST, ABDOMEN, AND PELVIS WITH CONTRAST TECHNIQUE: Multidetector CT imaging of the chest, abdomen and pelvis was performed following the standard protocol during bolus administration of intravenous contrast. CONTRAST:  45mL OMNIPAQUE IOHEXOL 300 MG/ML  SOLN COMPARISON:  Feb 25, 2020 FINDINGS: CT CHEST FINDINGS Cardiovascular: RIGHT-sided Port-A-Cath terminates in the  RIGHT atrium as before. Heart size is stable without pericardial effusion. Engorgement of central pulmonary vasculature slightly greater than 3 cm. Mediastinum/Nodes: Thoracic inlet structures are normal. No axillary lymphadenopathy. No mediastinal lymphadenopathy. No hilar lymphadenopathy. Esophagus grossly normal. Lungs/Pleura: No suspicious pulmonary mass or nodule. Airways are patent. Tiny nodule along the fissure in the RIGHT lower lobe is stable at approximately 2-3 mm. The signs of lingular and RIGHT middle lobe scarring. Musculoskeletal: See below for full musculoskeletal detail. Spinal degenerative changes. No acute process involving the bony thorax. CT ABDOMEN PELVIS FINDINGS Hepatobiliary: No focal, suspicious hepatic lesion. Post cholecystectomy with less biliary duct distension that was evident on the prior  study. Tiny hypodensity in the RIGHT hepatic lobe is stable. The portal vein is patent. Pancreas: Pancreas without inflammation or focal lesion. Mild ductal distension unchanged. Spleen: Spleen normal in size and contour without focal lesion. Adrenals/Urinary Tract: Adrenal glands are normal. Cysts of the bilateral kidneys are unchanged. No hydronephrosis. Urinary bladder under distended. Stomach/Bowel: Signs of APR with LEFT lower quadrant colostomy. Fluid density along the RIGHT lateral margin of the colon in the ostomy has increased slowly over time. To a lesser extent this is true along the LEFT lateral margin. There is no stranding about the colon as it passes through the abdominal wall. Greatest thickness of fluid at the level of the ostomy approximately 2 cm, previously approximately 1.4 cm. Vascular/Lymphatic: Calcified and noncalcified atheromatous plaque of the abdominal aorta. There is no gastrohepatic or hepatoduodenal ligament lymphadenopathy. No retroperitoneal or mesenteric lymphadenopathy. No pelvic sidewall lymphadenopathy. Reproductive: Increasing fullness about the vaginal introitus at the pelvic floor with masslike appearance suggested in this location best seen on image 118 of series 2 and image 73 of series 4 this is of uncertain significance but suspicious certainly more pronounced than in 2018-12-15 and more pronounced than on the prior exam Other: No peritoneal nodularity or ascites. Musculoskeletal: No acute bone process. No destructive bone finding. Spinal degenerative changes. IMPRESSION: 1. More masslike appearance of the vaginal introitus and lower vagina. The possibility of disease in this area is considered, this is likely amenable to direct clinical inspection for signs of mass. 2. Fluid density about the colon slightly increasing over time. The possibility disease in this area is not excluded as there is a lack of significant colonic inflammation in this location. Focused  ultrasound and aspiration could be performed though findings about the vagina are more suspicious particularly given the report of close vaginal margins and the irregular appearance on CT that is definitely changed since the baseline CT evaluation of December 15, 2018 3. No findings to suggest metastatic disease to the chest. 4. Engorgement of central pulmonary vasculature slightly greater than 3 cm, raising the question of pulmonary arterial hypertension. 5. Stable biliary and pancreatic ductal distension. 6. Aortic atherosclerosis. These results will be called to the ordering clinician or representative by the Radiologist Assistant, and communication documented in the PACS or [employer]. Aortic Atherosclerosis (ICD10-I70.0). Electronically Signed   By: Zetta Bills M.D.   On: 06/25/2020 15:42    CT CHEST ABDOMEN PELVIS W CONTRAST  Result Date: 06/25/2020 CLINICAL DATA:  History of rectal cancer and vaginal bleeding with elevated CEA EXAM: CT CHEST, ABDOMEN, AND PELVIS WITH CONTRAST TECHNIQUE: Multidetector CT imaging of the chest, abdomen and pelvis was performed following the standard protocol during bolus administration of intravenous contrast. CONTRAST:  58mL OMNIPAQUE IOHEXOL 300 MG/ML  SOLN COMPARISON:  Feb 25, 2020 FINDINGS: CT CHEST FINDINGS  Cardiovascular: RIGHT-sided Port-A-Cath terminates in the RIGHT atrium as before. Heart size is stable without pericardial effusion. Engorgement of central pulmonary vasculature slightly greater than 3 cm. Mediastinum/Nodes: Thoracic inlet structures are normal. No axillary lymphadenopathy. No mediastinal lymphadenopathy. No hilar lymphadenopathy. Esophagus grossly normal. Lungs/Pleura: No suspicious pulmonary mass or nodule. Airways are patent. Tiny nodule along the fissure in the RIGHT lower lobe is stable at approximately 2-3 mm. The signs of lingular and RIGHT middle lobe scarring. Musculoskeletal: See below for full musculoskeletal detail. Spinal  degenerative changes. No acute process involving the bony thorax. CT ABDOMEN PELVIS FINDINGS Hepatobiliary: No focal, suspicious hepatic lesion. Post cholecystectomy with less biliary duct distension that was evident on the prior study. Tiny hypodensity in the RIGHT hepatic lobe is stable. The portal vein is patent. Pancreas: Pancreas without inflammation or focal lesion. Mild ductal distension unchanged. Spleen: Spleen normal in size and contour without focal lesion. Adrenals/Urinary Tract: Adrenal glands are normal. Cysts of the bilateral kidneys are unchanged. No hydronephrosis. Urinary bladder under distended. Stomach/Bowel: Signs of APR with LEFT lower quadrant colostomy. Fluid density along the RIGHT lateral margin of the colon in the ostomy has increased slowly over time. To a lesser extent this is true along the LEFT lateral margin. There is no stranding about the colon as it passes through the abdominal wall. Greatest thickness of fluid at the level of the ostomy approximately 2 cm, previously approximately 1.4 cm. Vascular/Lymphatic: Calcified and noncalcified atheromatous plaque of the abdominal aorta. There is no gastrohepatic or hepatoduodenal ligament lymphadenopathy. No retroperitoneal or mesenteric lymphadenopathy. No pelvic sidewall lymphadenopathy. Reproductive: Increasing fullness about the vaginal introitus at the pelvic floor with masslike appearance suggested in this location best seen on image 118 of series 2 and image 73 of series 4 this is of uncertain significance but suspicious certainly more pronounced than in 2018-12-22 and more pronounced than on the prior exam Other: No peritoneal nodularity or ascites. Musculoskeletal: No acute bone process. No destructive bone finding. Spinal degenerative changes. IMPRESSION: 1. More masslike appearance of the vaginal introitus and lower vagina. The possibility of disease in this area is considered, this is likely amenable to direct clinical  inspection for signs of mass. 2. Fluid density about the colon slightly increasing over time. The possibility disease in this area is not excluded as there is a lack of significant colonic inflammation in this location. Focused ultrasound and aspiration could be performed though findings about the vagina are more suspicious particularly given the report of close vaginal margins and the irregular appearance on CT that is definitely changed since the baseline CT evaluation of 22-Dec-2018 3. No findings to suggest metastatic disease to the chest. 4. Engorgement of central pulmonary vasculature slightly greater than 3 cm, raising the question of pulmonary arterial hypertension. 5. Stable biliary and pancreatic ductal distension. 6. Aortic atherosclerosis. These results will be called to the ordering clinician or representative by the Radiologist Assistant, and communication documented in the PACS or [employer]. Aortic Atherosclerosis (ICD10-I70.0). Electronically Signed   By: Zetta Bills M.D.   On: 06/25/2020 15:42    ASSESSMENT & PLAN:  1. Rectal cancer ([place])   2. Current use of long term anticoagulation   3. History of pulmonary embolism   4. History of DVT (deep vein thrombosis)   5. Vaginal bleeding   Cancer Staging Rectal cancer [facility]) Staging form: Colon and Rectum, AJCC 8th Edition - Clinical stage from 01/23/2019: Stage IIIC (cT4b, cN1a, cM0) - Signed  by Earlie Server, MD on 01/23/2019 - Pathologic stage from 10/06/2019: Stage IIC (ypT4b, pN0, cM0) - Signed by Earlie Server, MD on 10/06/2019  #History of stage IIIC Rectal cancer Labs are reviewed and discussed with patient.  Concerning significantly elevated CEA. Patient was referred to GI and she canceled her appointment in July and recently established care with [facility]  Plan colonoscopy Her MRI abdomen/pelvis did not show significant signs of disease recurrence.  Given the progressively increasing CEA, I will obtain CT chest abdomen  pelvis.  #History of acute pulmonary embolism, acute bilateral proximal DVT, unprovoked Patient has finished 6 months of Eliquis 5 mg twice daily.  She also is currently experiencing vaginal bleeding.  Recommend patient to decrease to Eliquis 2.5 mg twice daily for anticoagulation prophylaxis.  #Vaginal bleeding CT chest abdomen pelvis was independently reviewed by me. masslike appearance of the vaginal introitus and lower vagina, I refer patient to see GYN oncology for tissue diagnosis.  Follow-up to be determined  Earlie Server, MD, PhD

## 2020-06-28 ENCOUNTER — Telehealth: Payer: Self-pay

## 2020-06-28 ENCOUNTER — Telehealth: Payer: Self-pay | Admitting: *Deleted

## 2020-06-28 NOTE — Telephone Encounter (Signed)
GYN ONC will see her this week and Silverio Lay, RN has spoken to patient.

## 2020-06-28 NOTE — Telephone Encounter (Signed)
Please tell pt that I have reviewed the CT scan last week already. And I am waiting for [facility] to see if she can be seen.

## 2020-06-28 NOTE — Telephone Encounter (Signed)
Olivia Mackie called to say that there were results waiting for Dr. Tasia Catchings to receive for this patient.

## 2020-06-28 NOTE — Telephone Encounter (Signed)
Received message from Dr. Tasia Catchings regarding recent CT scan with masslike appearance of vaginal introitus and lower vagina. Vaginal bleeding. CEA progressively rising. I have spoken with Jacqueline Yoder and arranged appointment for exam and possible biopsies.

## 2020-06-28 NOTE — Telephone Encounter (Signed)
Reason for call clarified.

## 2020-06-30 ENCOUNTER — Other Ambulatory Visit: Payer: Self-pay

## 2020-06-30 ENCOUNTER — Inpatient Hospital Stay (HOSPITAL_BASED_OUTPATIENT_CLINIC_OR_DEPARTMENT_OTHER): Payer: Medicare Other | Admitting: Obstetrics and Gynecology

## 2020-06-30 VITALS — BP 129/71 | HR 97 | Temp 97.8°F | Resp 20 | Wt 164.1 lb

## 2020-06-30 DIAGNOSIS — C20 Malignant neoplasm of rectum: Secondary | ICD-10-CM | POA: Diagnosis not present

## 2020-06-30 DIAGNOSIS — N939 Abnormal uterine and vaginal bleeding, unspecified: Secondary | ICD-10-CM
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
## 2020-07-02 ENCOUNTER — Telehealth: Payer: Self-pay

## 2020-07-02 LAB — SURGICAL PATHOLOGY

## 2020-07-02 NOTE — Telephone Encounter (Signed)
Done.. Pt will RTC on 9/20 Pt is aware of her sched appt

## 2020-07-02 NOTE — Telephone Encounter (Signed)
Message received from Dr. Tasia Catchings to schedule patient to see her next week to discuss pathology results.    Dr. Tasia Catchings would also like to send [facility] on CASE: [id number]-rectal [id number]  Left message informing patient that Dr. Tasia Catchings would like to see her next week and she will get a call from scheduler with appt details.

## 2020-07-02 NOTE — Telephone Encounter (Signed)
[employer] order request faxed

## 2020-07-05 ENCOUNTER — Other Ambulatory Visit: Payer: Self-pay

## 2020-07-05 ENCOUNTER — Encounter: Payer: Self-pay | Admitting: Oncology

## 2020-07-05 ENCOUNTER — Inpatient Hospital Stay: Payer: Medicare Other

## 2020-07-05 ENCOUNTER — Inpatient Hospital Stay: Payer: Medicare Other | Admitting: Oncology

## 2020-07-05 VITALS — BP 148/83 | HR 90 | Temp 96.6°F | Resp 18 | Wt 163.2 lb

## 2020-07-05 DIAGNOSIS — N898 Other specified noninflammatory disorders of vagina: Secondary | ICD-10-CM

## 2020-07-05 DIAGNOSIS — Z95828 Presence of other vascular implants and grafts: Secondary | ICD-10-CM

## 2020-07-05 DIAGNOSIS — Z7189 Other specified counseling: Secondary | ICD-10-CM

## 2020-07-05 DIAGNOSIS — C20 Malignant neoplasm of rectum: Secondary | ICD-10-CM

## 2020-07-05 MED ORDER — SODIUM CHLORIDE 0.9% FLUSH
10.0000 mL | INTRAVENOUS | Status: DC | PRN
  Administered 2020-07-05: 10 mL via INTRAVENOUS
  Filled 2020-07-05: qty 10

## 2020-07-05 MED ORDER — HEPARIN SOD (PORK) LOCK FLUSH 100 UNIT/ML IV SOLN
500.0000 [IU] | Freq: Once | INTRAVENOUS | Status: AC
  Administered 2020-07-05: 500 [IU] via INTRAVENOUS
  Filled 2020-07-05: qty 5

## 2020-07-05 MED ORDER — HEPARIN SOD (PORK) LOCK FLUSH 100 UNIT/ML IV SOLN
INTRAVENOUS | Status: AC
  Filled 2020-07-05: qty 5

## 2020-07-05 NOTE — Progress Notes (Signed)
Hematology/Oncology progress note [facility] [phone] [phone]   Patient Care Team: Kirk Ruths, MD as PCP - General (Internal Medicine) Clent Jacks, RN as Registered Nurse  REFERRING PROVIDER: Dr.Secord CHIEF COMPLAINTS/REASON FOR VISIT:  Follow up for rectal cancer  HISTORY OF PRESENTING ILLNESS:  Jacqueline Yoder is a  74 y.o.  female with PMH listed below who was referred to me for evaluation of endometrial cancer.   Patient initially presented with complaints of postmenopausal bleeding on 08/16/2018.  History of was menopausal vaginal bleeding in 2016 which resulted in cervical polypectomy.  Pathology 02/04/2015 showed cervical polyp, consistent with benign endometrial polyp.  [name] after polypectomy due to anxiety associated with pelvic exams.  pelvic exam on 08/16/2018 reviewed cervical abnormality and from enlarged uterus. Seen by Dr. Marcelline Mates on 10/29/2018.  Endometrial biopsy and a Pap smear was performed. 10/29/2018 Pap smear showed adenocarcinoma, favor endometrial origin. 10/29/2018 endometrial biopsy showed endometrioid carcinoma, FIGO grade 1.  10/29/2018- TA & TV Ultrasound revealed: Anteverted uterus measuring 8.7 x 5.6 x 6.4 cm without evidence of focal masses.  The endometrium measuring 24.1 mm (thickened) and heterogeneous.  Right and left ovaries not visualized.  No adnexal masses identified.  No free fluid in cul-de-sac.  Patient was seen by Dr. Theora Gianotti in clinic on 11/13/2018.  Cervical exam reveals 2 cm exophytic irregular mass consistent with malignancy.   11/19/2018 CT chest abdomen pelvis with contrast showed thickened endometrium with some irregularity compatible with the provided diagnosis of endometrial malignancy.  There is a mildly prominent left inguinal node 1.4 cm.  Patient was seen by Dr. Fransisca Connors on 11/20/2018 and left groin lymph node biopsy was recommended.  11/26/2018 patient underwent  left inguinal lymph node biopsy. Pathology showed metastatic adenocarcinoma consistent with colorectal origin.  CDX 2+.  Case was discussed on tumor board.  Recommend colonoscopy for further evaluation.  Patient reports significant weight loss 30 pounds over the last year.  Chronic vaginal spotting. Change of bowel habits the past few months.  More constipated.  Family history positive for brother who has colon cancer prostate cancer.  patient has underwent colonoscopy on 12/03/2018 which reviewed a nonobstructing large mass in the rectum.  Also chronic fistula.  Mass was not circumferential.  This was biopsied with a cold forceps for histology.  Pathology came back hyperplastic polyp negative for dysplasia and malignancy. [name], patient underwent flex sigmoidoscopy on 12/06/2018 with rebiopsy of the rectal mass. This time biopsy results came back positive for invasive colorectal adenocarcinoma, moderately differentiated. Immunotherapy for nearly mismatch repair protein (MMR ) was performed.  There is no loss of MMR expression.  low probability of MSI high.   # Seen by [name] for evaluation of resectability for rectal cancer. In addition, she also had a second opinion with [facility] where her endometrial biopsy pathology was changed to  adenocarcinoma, consistent with colorectal primary.   [name]. She has home health that has been assisting with ostomy care  Patient was also evaluated by [facility] oncology.  Recommendation is to proceed with TNT with concurrent chemoradiation followed by neoadjuvant chemotherapy followed by surgical resection. Patient prefers to have treatment done locally with [facility].   # Oncology Treatment:  02/03/2019- 03/19/2019  concurrent Xeloda and radiation.  Xeloda dose 855m /m2 BID - rounded to 16555mBID- on days of radiation. 04/09/2019, started on FOLFOX with bolus 5-FU omitted.  07/16/2019  finished  8 cycles of FOLFOX.  # Patient has underwent 09/17/19 APR/posterior vaginectomy/TAH/BSO/VY-flap pT4b pN0 with close vaginal margin 0.2 mm.  Uterus and ovaries negative for malignancy. Patient reports bilateral lower extremity numbness and tingling, intermittent, left worse than right. She has lost a lot of weight since her APR surgery.   Surveillance plan Recommend history and physical/tumor marker monitoring every 3 to 6 months for the first 2 years after surgery and then every 6 months for a total of 5 years. Chest abdomen pelvis every 6 to 12 months for total 5 years. Colonoscopy 1 year after surgery, if no advanced adenoma, repeat in 3 years and then 5 years.  #Family history with half brother having's history of colon cancer prostate cancer.  Personal history of colorectal cancer.  Patient has not decided if she wants genetic testing.    INTERVAL HISTORY Jacqueline Yoder is a 74 y.o. female who has above history reviewed by me presents for follow-up of rectal cancer. Patient has no new complaints. She is on Eliquis 5 mg twice daily for history of PE( 01/13/2020)  in the bilateral lower extremity DVT (01/13/2020).   Continues to have vaginal spotting.  Patient was seen by GYN oncology and had vaginal introitus mass biopsied.  Patient reports increased stool output, looser stool since yesterday.  Review of Systems  Constitutional: Negative for appetite change, chills, fatigue, fever and unexpected weight change.  HENT:   Negative for hearing loss and voice change.   Eyes: Negative for eye problems.  Respiratory: Negative for chest tightness and cough.   Cardiovascular: Negative for chest pain.  Gastrointestinal: Negative for abdominal distention, abdominal pain, blood in stool, constipation, diarrhea and nausea.       Colostomy bag  Endocrine: Negative for hot flashes.  Genitourinary: Negative for difficulty urinating and frequency.   Musculoskeletal: Negative for arthralgias.    Skin: Negative for itching and rash.  Neurological: Negative for extremity weakness and numbness.  Hematological: Negative for adenopathy.  Psychiatric/Behavioral: Negative for confusion.    MEDICAL HISTORY:  Past Medical History:  Diagnosis Date  . Arthritis   . GERD (gastroesophageal reflux disease)   . Hypercholesteremia   . Hypertension   . Hypertension   . Lower extremity edema   . Rectal cancer ([place]) 12/2018  . Urinary incontinence     SURGICAL HISTORY: Past Surgical History:  Procedure Laterality Date  . CHOLECYSTECTOMY  1971  . COLONOSCOPY WITH PROPOFOL N/A 12/03/2018   Procedure: COLONOSCOPY WITH PROPOFOL;  Surgeon: Lucilla Lame, MD;  Location: [facility] ENDOSCOPY;  Service: Endoscopy;  Laterality: N/A;  . FLEXIBLE SIGMOIDOSCOPY N/A 12/06/2018   Procedure: FLEXIBLE SIGMOIDOSCOPY;  Surgeon: [name], MD;  Location: [facility] ENDOSCOPY;  Service: Endoscopy;  Laterality: N/A;  . LAPAROSCOPIC COLOSTOMY  01/06/2019  . PORTACATH PLACEMENT N/A 04/03/2019   Procedure: INSERTION PORT-A-CATH;  Surgeon: Jules Husbands, MD;  Location: [facility] ORS;  Service: General;  Laterality: N/A;    SOCIAL HISTORY: Social History   Socioeconomic History  . Marital status: Married    Spouse name: Not on file  . Number of children: Not on file  . Years of education: Not on file  . Highest education level: Not on file  Occupational History  . Not on file  Tobacco Use  . Smoking status: Former Smoker    Quit date: 12/02/1977    Years since quitting: 42.6  . Smokeless tobacco: Never Used  Vaping Use  . Vaping Use: Never used  Substance and Sexual Activity  .  Alcohol use: Never  . Drug use: Never  . Sexual activity: Not Currently    Birth control/protection: None  Other Topics Concern  . Not on file  Social History Narrative   Lives with daughter   Social Determinants of Health   Financial Resource Strain:   . Difficulty of Paying Living Expenses: Not on file  Food Insecurity:   .  Worried About Charity fundraiser in the Last Year: Not on file  . Ran Out of Food in the Last Year: Not on file  Transportation Needs:   . Lack of Transportation (Medical): Not on file  . Lack of Transportation (Non-Medical): Not on file  Physical Activity:   . Days of Exercise per Week: Not on file  . Minutes of Exercise per Session: Not on file  Stress:   . Feeling of Stress : Not on file  Social Connections:   . Frequency of Communication with Friends and Family: Not on file  . Frequency of Social Gatherings with Friends and Family: Not on file  . Attends Religious Services: Not on file  . Active Member of Clubs or Organizations: Not on file  . Attends Archivist Meetings: Not on file  . Marital Status: Not on file  Intimate Partner Violence:   . Fear of Current or Ex-Partner: Not on file  . Emotionally Abused: Not on file  . Physically Abused: Not on file  . Sexually Abused: Not on file    FAMILY HISTORY: Family History  Problem Relation Age of Onset  . Colon cancer Brother   . Prostate cancer Brother   . Hypertension Mother   . Stroke Mother   . Kidney failure Father   . Breast cancer Neg Hx   . Ovarian cancer Neg Hx     ALLERGIES:  is allergic to sulfamethoxazole-trimethoprim.  MEDICATIONS:  Current Outpatient Medications  Medication Sig Dispense Refill  . apixaban (ELIQUIS) 2.5 MG TABS tablet Take 1 tablet (2.5 mg total) by mouth 2 (two) times daily. 60 tablet 3  . chlorhexidine (PERIDEX) 0.12 % solution Use as directed 15 mLs in the mouth or throat 2 (two) times daily. 473 mL 3  . Cholecalciferol (VITAMIN D3) 2000 units capsule Take 2,000 Units by mouth daily.    Verneita Griffes Bark POWD Take by mouth.     . docusate sodium (COLACE) 100 MG capsule Take 100 mg by mouth 2 (two) times daily.    . fexofenadine (ALLEGRA) 180 MG tablet Take 180 mg by mouth daily.    . fluticasone (FLONASE) 50 MCG/ACT nasal spray Place 1 spray into both nostrils 2 (two) times a  day.     . lidocaine-prilocaine (EMLA) cream Apply 1 application topically as needed. 30 g 6  . Multiple Vitamins-Minerals (ONE-A-DAY WOMENS 50 PLUS PO) Take 1 tablet by mouth daily.     . potassium chloride SA (K-DUR) 20 MEQ tablet Take 20 mEq by mouth daily.    . prednisoLONE acetate (PRED FORTE) 1 % ophthalmic suspension Place 1 drop into both eyes 2 (two) times a day.   1  . simvastatin (ZOCOR) 40 MG tablet Take 40 mg by mouth at bedtime.     . triamterene-hydrochlorothiazide (DYAZIDE) 37.5-25 MG capsule Take 1 capsule by mouth daily.     . diclofenac sodium (VOLTAREN) 1 % GEL Apply 2 g topically 4 (four) times daily as needed (joint pain).  (Patient not taking: Reported on 06/25/2020)  11  . FIBER ADULT GUMMIES PO Take  1 Dose by mouth daily. (Patient not taking: Reported on 06/25/2020)    . gabapentin (NEURONTIN) 100 MG capsule Take 1 capsule (100 mg total) by mouth at bedtime. (Patient not taking: Reported on 06/25/2020) 90 capsule 0  . HYDROcodone-acetaminophen (NORCO/VICODIN) 5-325 MG tablet Take 1 tablet by mouth every 4 (four) hours as needed for moderate pain. (Patient not taking: Reported on 06/25/2020) 8 tablet 0  . ipratropium (ATROVENT) 0.03 % nasal spray  (Patient not taking: Reported on 06/25/2020)    . ketorolac (ACULAR) 0.4 % SOLN Place 1 drop into both eyes daily.  (Patient not taking: Reported on 06/25/2020)    . loperamide (IMODIUM) 2 MG capsule Take 1 capsule (2 mg total) by mouth See admin instructions. With onset of loose stool, take 30m followed by 226mevery 2 hours,  Maximum: 16 mg/day (Patient not taking: Reported on 06/25/2020) 120 capsule 1  . Lutein 40 MG CAPS Take 40 mg by mouth daily.  (Patient not taking: Reported on 06/25/2020)    . Lutein-Bilberry (BILBERRY PLUS LUTEIN) 02-999 MCG-MG CAPS Take 1 capsule by mouth daily.  (Patient not taking: Reported on 06/25/2020)    . magic mouthwash w/lidocaine SOLN Take 5 mLs by mouth 4 (four) times daily as needed for mouth pain. Sig:  Swish/Swallow 5-10 ml four times a day as needed (Patient not taking: Reported on 06/25/2020) 480 mL 3  . ondansetron (ZOFRAN) 8 MG tablet Take 1 tablet (8 mg total) by mouth every 8 (eight) hours as needed for nausea, vomiting or refractory nausea / vomiting. (Patient not taking: Reported on 06/25/2020) 90 tablet 1  . Ospemifene (OSPHENA) 60 MG TABS Take 1 tablet by mouth daily. (Patient not taking: Reported on 06/25/2020) 30 tablet 2  . pantoprazole (PROTONIX) 40 MG tablet Take 1 tablet (40 mg total) by mouth as needed. (Patient not taking: Reported on 06/25/2020) 30 tablet 0  . prochlorperazine (COMPAZINE) 10 MG tablet Take 1 tablet (10 mg total) by mouth every 6 (six) hours as needed (Nausea or vomiting). (Patient not taking: Reported on 06/25/2020) 30 tablet 1   No current facility-administered medications for this visit.     PHYSICAL EXAMINATION: ECOG PERFORMANCE STATUS: 1 - Symptomatic but completely ambulatory Vitals:   07/05/20 1329  BP: (!) 148/83  Pulse: 90  Resp: 18  Temp: (!) 96.6 F (35.9 C)  SpO2: 98%   Filed Weights   07/05/20 1329  Weight: 163 lb 3.2 oz (74 kg)    Physical Exam Constitutional:      General: She is not in acute distress. HENT:     Head: Normocephalic and atraumatic.  Eyes:     General: No scleral icterus. Cardiovascular:     Rate and Rhythm: Normal rate and regular rhythm.     Heart sounds: Normal heart sounds.  Pulmonary:     Effort: Pulmonary effort is normal. No respiratory distress.     Breath sounds: No wheezing.  Abdominal:     General: Bowel sounds are normal. There is no distension.     Palpations: Abdomen is soft.     Comments: + Colostomy bag   Musculoskeletal:        General: No deformity. Normal range of motion.     Cervical back: Normal range of motion and neck supple.  Skin:    General: Skin is warm and dry.     Findings: No erythema or rash.  Neurological:     Mental Status: She is alert and oriented to person, place,  and  time. Mental status is at baseline.     Cranial Nerves: No cranial nerve deficit.     Coordination: Coordination normal.  Psychiatric:        Mood and Affect: Mood normal.    CMP Latest Ref Rng & Units 06/23/2020  Glucose 70 - 99 mg/dL 114(H)  BUN 8 - 23 mg/dL 10  Creatinine 0.44 - 1.00 mg/dL 0.97  Sodium 135 - 145 mmol/L 140  Potassium 3.5 - 5.1 mmol/L 3.7  Chloride 98 - 111 mmol/L 100  CO2 22 - 32 mmol/L 30  Calcium 8.9 - 10.3 mg/dL 9.1  Total Protein 6.5 - 8.1 g/dL 7.0  Total Bilirubin 0.3 - 1.2 mg/dL 0.4  Alkaline Phos 38 - 126 U/L 41  AST 15 - 41 U/L 25  ALT 0 - 44 U/L 15   CBC Latest Ref Rng & Units 06/23/2020  WBC 4.0 - 10.5 K/uL 4.1  Hemoglobin 12.0 - 15.0 g/dL 11.6(L)  Hematocrit 36 - 46 % 35.6(L)  Platelets 150 - 400 K/uL 231    LABORATORY DATA:  I have reviewed the data as listed Lab Results  Component Value Date   WBC 4.1 06/23/2020   HGB 11.6 (L) 06/23/2020   HCT 35.6 (L) 06/23/2020   MCV 91.3 06/23/2020   PLT 231 06/23/2020   Recent Labs    02/18/20 1339 03/19/20 0826 06/23/20 1124  NA 139 142 140  K 3.7 4.1 3.7  CL 102 102 100  CO2 _0 GLUCOSE 103* 95 114*  BUN _1 CREATININE 1.01* 1.01* 0.97  CALCIUM 9.3 9.9 9.1  GFRNONAA 56* 56* 58*  GFRAA >60 >60 >60  PROT 7.0 7.5 7.0  ALBUMIN 4.0 4.4 4.0  AST _2 ALT _3 ALKPHOS 34* 37* 41  BILITOT 0.6 0.8 0.4   Iron/TIBC/Ferritin/ %Sat    Component Value Date/Time   IRON 40 06/23/2020 1124   TIBC 246 (L) 06/23/2020 1124   FERRITIN 338 (H) 06/23/2020 1124   IRONPCTSAT 16 06/23/2020 1124     RADIOGRAPHIC STUDIES: I have personally reviewed the radiological images as listed and agreed with the findings in the report. CT CHEST ABDOMEN PELVIS W CONTRAST  Result Date: 06/25/2020 CLINICAL DATA:  History of rectal cancer and vaginal bleeding with elevated CEA EXAM: CT CHEST, ABDOMEN, AND PELVIS WITH CONTRAST TECHNIQUE: Multidetector CT imaging of the chest, abdomen and pelvis  was performed following the standard protocol during bolus administration of intravenous contrast. CONTRAST:  52m OMNIPAQUE IOHEXOL 300 MG/ML  SOLN COMPARISON:  Feb 25, 2020 FINDINGS: CT CHEST FINDINGS Cardiovascular: RIGHT-sided Port-A-Cath terminates in the RIGHT atrium as before. Heart size is stable without pericardial effusion. Engorgement of central pulmonary vasculature slightly greater than 3 cm. Mediastinum/Nodes: Thoracic inlet structures are normal. No axillary lymphadenopathy. No mediastinal lymphadenopathy. No hilar lymphadenopathy. Esophagus grossly normal. Lungs/Pleura: No suspicious pulmonary mass or nodule. Airways are patent. Tiny nodule along the fissure in the RIGHT lower lobe is stable at approximately 2-3 mm. The signs of lingular and RIGHT middle lobe scarring. Musculoskeletal: See below for full musculoskeletal detail. Spinal degenerative changes. No acute process involving the bony thorax. CT ABDOMEN PELVIS FINDINGS Hepatobiliary: No focal, suspicious hepatic lesion. Post cholecystectomy with less biliary duct distension that was evident on the prior study. Tiny hypodensity in the RIGHT hepatic lobe is stable. The portal vein is patent. Pancreas: Pancreas without inflammation or focal lesion. Mild ductal distension unchanged. Spleen: Spleen normal in  size and contour without focal lesion. Adrenals/Urinary Tract: Adrenal glands are normal. Cysts of the bilateral kidneys are unchanged. No hydronephrosis. Urinary bladder under distended. Stomach/Bowel: Signs of APR with LEFT lower quadrant colostomy. Fluid density along the RIGHT lateral margin of the colon in the ostomy has increased slowly over time. To a lesser extent this is true along the LEFT lateral margin. There is no stranding about the colon as it passes through the abdominal wall. Greatest thickness of fluid at the level of the ostomy approximately 2 cm, previously approximately 1.4 cm. Vascular/Lymphatic: Calcified and noncalcified  atheromatous plaque of the abdominal aorta. There is no gastrohepatic or hepatoduodenal ligament lymphadenopathy. No retroperitoneal or mesenteric lymphadenopathy. No pelvic sidewall lymphadenopathy. Reproductive: Increasing fullness about the vaginal introitus at the pelvic floor with masslike appearance suggested in this location best seen on image 118 of series 2 and image 73 of series 4 this is of uncertain significance but suspicious certainly more pronounced than in 2018/12/29 and more pronounced than on the prior exam Other: No peritoneal nodularity or ascites. Musculoskeletal: No acute bone process. No destructive bone finding. Spinal degenerative changes. IMPRESSION: 1. More masslike appearance of the vaginal introitus and lower vagina. The possibility of disease in this area is considered, this is likely amenable to direct clinical inspection for signs of mass. 2. Fluid density about the colon slightly increasing over time. The possibility disease in this area is not excluded as there is a lack of significant colonic inflammation in this location. Focused ultrasound and aspiration could be performed though findings about the vagina are more suspicious particularly given the report of close vaginal margins and the irregular appearance on CT that is definitely changed since the baseline CT evaluation of 2018/12/29 3. No findings to suggest metastatic disease to the chest. 4. Engorgement of central pulmonary vasculature slightly greater than 3 cm, raising the question of pulmonary arterial hypertension. 5. Stable biliary and pancreatic ductal distension. 6. Aortic atherosclerosis. These results will be called to the ordering clinician or representative by the Radiologist Assistant, and communication documented in the PACS or [employer]. Aortic Atherosclerosis (ICD10-I70.0). Electronically Signed   By: Zetta Bills M.D.   On: 06/25/2020 15:42    CT CHEST ABDOMEN PELVIS W  CONTRAST  Result Date: 06/25/2020 CLINICAL DATA:  History of rectal cancer and vaginal bleeding with elevated CEA EXAM: CT CHEST, ABDOMEN, AND PELVIS WITH CONTRAST TECHNIQUE: Multidetector CT imaging of the chest, abdomen and pelvis was performed following the standard protocol during bolus administration of intravenous contrast. CONTRAST:  44m OMNIPAQUE IOHEXOL 300 MG/ML  SOLN COMPARISON:  Feb 25, 2020 FINDINGS: CT CHEST FINDINGS Cardiovascular: RIGHT-sided Port-A-Cath terminates in the RIGHT atrium as before. Heart size is stable without pericardial effusion. Engorgement of central pulmonary vasculature slightly greater than 3 cm. Mediastinum/Nodes: Thoracic inlet structures are normal. No axillary lymphadenopathy. No mediastinal lymphadenopathy. No hilar lymphadenopathy. Esophagus grossly normal. Lungs/Pleura: No suspicious pulmonary mass or nodule. Airways are patent. Tiny nodule along the fissure in the RIGHT lower lobe is stable at approximately 2-3 mm. The signs of lingular and RIGHT middle lobe scarring. Musculoskeletal: See below for full musculoskeletal detail. Spinal degenerative changes. No acute process involving the bony thorax. CT ABDOMEN PELVIS FINDINGS Hepatobiliary: No focal, suspicious hepatic lesion. Post cholecystectomy with less biliary duct distension that was evident on the prior study. Tiny hypodensity in the RIGHT hepatic lobe is stable. The portal vein is patent. Pancreas: Pancreas without inflammation or focal lesion. Mild ductal  distension unchanged. Spleen: Spleen normal in size and contour without focal lesion. Adrenals/Urinary Tract: Adrenal glands are normal. Cysts of the bilateral kidneys are unchanged. No hydronephrosis. Urinary bladder under distended. Stomach/Bowel: Signs of APR with LEFT lower quadrant colostomy. Fluid density along the RIGHT lateral margin of the colon in the ostomy has increased slowly over time. To a lesser extent this is true along the LEFT lateral  margin. There is no stranding about the colon as it passes through the abdominal wall. Greatest thickness of fluid at the level of the ostomy approximately 2 cm, previously approximately 1.4 cm. Vascular/Lymphatic: Calcified and noncalcified atheromatous plaque of the abdominal aorta. There is no gastrohepatic or hepatoduodenal ligament lymphadenopathy. No retroperitoneal or mesenteric lymphadenopathy. No pelvic sidewall lymphadenopathy. Reproductive: Increasing fullness about the vaginal introitus at the pelvic floor with masslike appearance suggested in this location best seen on image 118 of series 2 and image 73 of series 4 this is of uncertain significance but suspicious certainly more pronounced than in 21-Dec-2018 and more pronounced than on the prior exam Other: No peritoneal nodularity or ascites. Musculoskeletal: No acute bone process. No destructive bone finding. Spinal degenerative changes. IMPRESSION: 1. More masslike appearance of the vaginal introitus and lower vagina. The possibility of disease in this area is considered, this is likely amenable to direct clinical inspection for signs of mass. 2. Fluid density about the colon slightly increasing over time. The possibility disease in this area is not excluded as there is a lack of significant colonic inflammation in this location. Focused ultrasound and aspiration could be performed though findings about the vagina are more suspicious particularly given the report of close vaginal margins and the irregular appearance on CT that is definitely changed since the baseline CT evaluation of December 21, 2018 3. No findings to suggest metastatic disease to the chest. 4. Engorgement of central pulmonary vasculature slightly greater than 3 cm, raising the question of pulmonary arterial hypertension. 5. Stable biliary and pancreatic ductal distension. 6. Aortic atherosclerosis. These results will be called to the ordering clinician or representative by the  Radiologist Assistant, and communication documented in the PACS or [employer]. Aortic Atherosclerosis (ICD10-I70.0). Electronically Signed   By: Zetta Bills M.D.   On: 06/25/2020 15:42    ASSESSMENT & PLAN:  1. Rectal cancer ([place])   2. Vaginal mass   3. Goals of care, counseling/discussion   Cancer Staging Rectal cancer [facility]) Staging form: Colon and Rectum, AJCC 8th Edition - Clinical stage from 01/23/2019: Stage IIIC (cT4b, cN1a, cM0) - Signed by Earlie Server, MD on 01/23/2019 - Pathologic stage from 10/06/2019: Stage IIC (ypT4b, pN0, cM0) - Signed by Earlie Server, MD on 10/06/2019  #History of stage IIIC Rectal cancer, s/p TNT, followed by 09/17/19 APR/posterior vaginectomy/TAH/BSO/VY-flap pT4b pN0 with close vaginal margin 0.2 mm.  Uterus and ovaries negative for malignancy. Now she has developed vaginal recurrence.  Vaginal introitus mass biopsy pathology showed involvement by colorectal adenocarcinoma. Results were communicated with patient. I will communicate with her surgeon at [facility] Dr. Hester Mates to see if her disease still feasible for resection. Also will discuss with Gynonc to see if surgery is indicated.  She is going to have a repeat colonoscopy by Dr. Vicente Males next week.   Refer to Radonc for evaluation of feasibility of re-radiation.  Plan to incorporate systemic chemotherapy to her potential surgery or RT.    #History of acute pulmonary embolism, acute bilateral proximal DVT, unprovoked Continue Eliquis 2.85m BID  Follow-up to  be determined  Earlie Server, MD, PhD

## 2020-07-05 NOTE — Progress Notes (Signed)
Patient here for oncology follow-up appointment, expresses complaints of loose stools.

## 2020-07-09 ENCOUNTER — Other Ambulatory Visit: Payer: Self-pay | Admitting: Oncology

## 2020-07-09 ENCOUNTER — Telehealth: Payer: Self-pay

## 2020-07-09 DIAGNOSIS — C20 Malignant neoplasm of rectum: Secondary | ICD-10-CM

## 2020-07-09 MED ORDER — PROCHLORPERAZINE MALEATE 10 MG PO TABS: 10.0000 mg | ORAL_TABLET | Freq: Four times a day (QID) | ORAL | 1 refills | Status: DC | PRN

## 2020-07-09 NOTE — Progress Notes (Signed)
ON PATHWAY REGIMEN - Colorectal  No Change  Continue With Treatment as Ordered.  Original Decision Date/Time: 03/06/2019 21:51     A cycle is every 14 days:     Oxaliplatin      Leucovorin      5-Fluorouracil      5-Fluorouracil   **Always confirm dose/schedule in your pharmacy ordering system**  Patient Characteristics: Preoperative or Nonsurgical Candidate (Clinical Staging), Rectal, cT3 - cT4, cN0 or Any cT, cN+ Therapeutic Status: Preoperative or Nonsurgical Candidate (Clinical Staging) Tumor Location: Rectal AJCC T Category: cT4b AJCC N Category: cN1 AJCC M Category: cM0 AJCC 8 Stage Grouping: IIIC Intent of Therapy: Curative Intent, Discussed with Patient

## 2020-07-09 NOTE — Progress Notes (Signed)
DISCONTINUE ON PATHWAY REGIMEN - Colorectal     A cycle is every 14 days:     Oxaliplatin      Leucovorin      5-Fluorouracil      5-Fluorouracil   **Always confirm dose/schedule in your pharmacy ordering system**  REASON: Disease Progression PRIOR TREATMENT: ROS56: mFOLFOX6 q14 Days x 4 Months TREATMENT RESPONSE: Progressive Disease (PD)  START ON PATHWAY REGIMEN - Colorectal     A cycle is every 14 days:     Bevacizumab-xxxx      Irinotecan      Leucovorin      Fluorouracil      Fluorouracil   **Always confirm dose/schedule in your pharmacy ordering system**  Patient Characteristics: Distant Metastases, Nonsurgical Candidate, KRAS/NRAS Mutation Positive/Unknown (BRAF V600 Wild-Type/Unknown), Standard Cytotoxic Therapy, First Line Standard Cytotoxic Therapy, Bevacizumab Eligible, PS = 0,1 Tumor Location: Rectal Therapeutic Status: Distant Metastases Microsatellite/Mismatch Repair Status: Unknown BRAF Mutation Status: Awaiting Test Results KRAS/NRAS Mutation Status: Awaiting Test Results Standard Cytotoxic Line of Therapy: First Line Standard Cytotoxic Therapy ECOG Performance Status: 1 Bevacizumab Eligibility: Eligible Intent of Therapy: Non-Curative / Palliative Intent, Discussed with Patient 

## 2020-07-09 NOTE — Telephone Encounter (Signed)
Dr. Tasia Catchings has talked with the surgeon at [facility].  The surgeon thinks surgery would NOT be beneficial for patient.  Per Dr. Tasia Catchings, patient needs to be scheduled for lab md FOFIRI + avastin *new* next week with dc pump on day 4.  Called patient and informed her of MD recommendation and appt details.

## 2020-07-13 ENCOUNTER — Other Ambulatory Visit: Payer: Self-pay

## 2020-07-13 ENCOUNTER — Other Ambulatory Visit
Admission: RE | Admit: 2020-07-13 | Discharge: 2020-07-13 | Disposition: A | Discharge status: Home / Self Care | Payer: Medicare Other | Source: Ambulatory Visit | Attending: Gastroenterology | Admitting: Gastroenterology

## 2020-07-13 ENCOUNTER — Other Ambulatory Visit: Payer: Self-pay | Admitting: Oncology

## 2020-07-13 DIAGNOSIS — Z20822 Contact with and (suspected) exposure to covid-19: Secondary | ICD-10-CM | POA: Diagnosis not present

## 2020-07-13 DIAGNOSIS — Z01812 Encounter for preprocedural laboratory examination: Secondary | ICD-10-CM | POA: Insufficient documentation

## 2020-07-13 LAB — SARS CORONAVIRUS 2 (TAT 6-24 HRS): SARS Coronavirus 2: NEGATIVE

## 2020-07-14 ENCOUNTER — Encounter: Payer: Self-pay | Admitting: Gastroenterology

## 2020-07-15 ENCOUNTER — Ambulatory Visit: Payer: Medicare Other | Admitting: Anesthesiology

## 2020-07-15 ENCOUNTER — Encounter: Payer: Self-pay | Admitting: Gastroenterology

## 2020-07-15 ENCOUNTER — Encounter
Admission: RE | Disposition: A | Discharge status: Home / Self Care | Payer: Self-pay | Source: Home / Self Care | Attending: Gastroenterology

## 2020-07-15 ENCOUNTER — Ambulatory Visit
Admission: RE | Admit: 2020-07-15 | Discharge: 2020-07-15 | Disposition: A | Discharge status: Home / Self Care | Payer: Medicare Other | Attending: Gastroenterology | Admitting: Gastroenterology

## 2020-07-15 ENCOUNTER — Other Ambulatory Visit: Payer: Medicare Other

## 2020-07-15 DIAGNOSIS — C20 Malignant neoplasm of rectum: Secondary | ICD-10-CM

## 2020-07-15 DIAGNOSIS — Z800 Family history of malignant neoplasm of digestive organs: Secondary | ICD-10-CM | POA: Diagnosis not present

## 2020-07-15 DIAGNOSIS — Z8249 Family history of ischemic heart disease and other diseases of the circulatory system: Secondary | ICD-10-CM | POA: Insufficient documentation

## 2020-07-15 DIAGNOSIS — Z87891 Personal history of nicotine dependence: Secondary | ICD-10-CM | POA: Diagnosis not present

## 2020-07-15 DIAGNOSIS — Z79899 Other long term (current) drug therapy: Secondary | ICD-10-CM | POA: Diagnosis not present

## 2020-07-15 DIAGNOSIS — K219 Gastro-esophageal reflux disease without esophagitis: Secondary | ICD-10-CM | POA: Insufficient documentation

## 2020-07-15 DIAGNOSIS — Z9049 Acquired absence of other specified parts of digestive tract: Secondary | ICD-10-CM | POA: Insufficient documentation

## 2020-07-15 DIAGNOSIS — I10 Essential (primary) hypertension: Secondary | ICD-10-CM | POA: Insufficient documentation

## 2020-07-15 DIAGNOSIS — M1990 Unspecified osteoarthritis, unspecified site: Secondary | ICD-10-CM | POA: Diagnosis not present

## 2020-07-15 DIAGNOSIS — Z85048 Personal history of other malignant neoplasm of rectum, rectosigmoid junction, and anus: Secondary | ICD-10-CM | POA: Diagnosis not present

## 2020-07-15 DIAGNOSIS — Z7901 Long term (current) use of anticoagulants: Secondary | ICD-10-CM | POA: Diagnosis not present

## 2020-07-15 DIAGNOSIS — Z1211 Encounter for screening for malignant neoplasm of colon: Secondary | ICD-10-CM | POA: Insufficient documentation

## 2020-07-15 DIAGNOSIS — Z8042 Family history of malignant neoplasm of prostate: Secondary | ICD-10-CM | POA: Diagnosis not present

## 2020-07-15 DIAGNOSIS — E7800 Pure hypercholesterolemia, unspecified: Secondary | ICD-10-CM | POA: Diagnosis not present

## 2020-07-15 DIAGNOSIS — Z882 Allergy status to sulfonamides status: Secondary | ICD-10-CM | POA: Insufficient documentation

## 2020-07-15 HISTORY — PX: COLONOSCOPY WITH PROPOFOL: SHX5780

## 2020-07-15 SURGERY — COLONOSCOPY WITH PROPOFOL
Anesthesia: General

## 2020-07-15 MED ORDER — PROPOFOL 500 MG/50ML IV EMUL
INTRAVENOUS | Status: AC
  Filled 2020-07-15: qty 50

## 2020-07-15 MED ORDER — PHENYLEPHRINE HCL (PRESSORS) 10 MG/ML IV SOLN
INTRAVENOUS | Status: DC | PRN
  Administered 2020-07-15: 50 ug via INTRAVENOUS

## 2020-07-15 MED ORDER — PROPOFOL 500 MG/50ML IV EMUL
INTRAVENOUS | Status: DC | PRN
  Administered 2020-07-15: 100 ug/kg/min via INTRAVENOUS

## 2020-07-15 MED ORDER — SODIUM CHLORIDE 0.9 % IV SOLN: INTRAVENOUS | Status: DC

## 2020-07-15 MED ORDER — PHENYLEPHRINE HCL (PRESSORS) 10 MG/ML IV SOLN
INTRAVENOUS | Status: AC
  Filled 2020-07-15: qty 1

## 2020-07-15 NOTE — Op Note (Signed)
[facility] Gastroenterology Patient Name: Jacqueline Yoder Procedure Date: 07/15/2020 8:01 AM MRN: [id number] Account #: [id number] Date of Birth: 04-13-1946 Admit Type: Outpatient Age: 74 Room: [facility] ENDO ROOM 3 Gender: Female Note Status: Finalized Procedure:             Colonoscopy Indications:           High risk colon cancer surveillance: Personal history                         of rectal cancer Providers:             [name] MD, MD Referring MD:          Ocie Cornfield. Ouida Sills MD, MD (Referring MD) Medicines:             Monitored Anesthesia Care Complications:         No immediate complications. Procedure:             Pre-Anesthesia Assessment:                        - Prior to the procedure, a History and Physical was                         performed, and patient medications, allergies and                         sensitivities were reviewed. The patient's tolerance                         of previous anesthesia was reviewed.                        - The risks and benefits of the procedure and the                         sedation options and risks were discussed with the                         patient. All questions were answered and informed                         consent was obtained.                        - ASA Grade Assessment: II - A patient with mild                         systemic disease.                        After obtaining informed consent, the colonoscope was                         passed under direct vision. Throughout the procedure,                         the patient's blood pressure, pulse, and oxygen                         saturations were monitored continuously. The  Colonoscope was introduced through the descending                         colostomy and advanced to the the cecum, identified by                         the appendiceal orifice. The colonoscopy was performed                         with ease. The  patient tolerated the procedure well.                         The quality of the bowel preparation was excellent. Findings:      The perianal exam findings include no anus      Normal mucosa was found in the descending colon, in the transverse       colon, in the ascending colon and in the cecum. Impression:            - Abnormal perianal exam.                        - Normal mucosa in the descending colon, in the                         transverse colon, in the ascending colon and in the                         cecum.                        - No specimens collected. Recommendation:        - Discharge patient to home (with escort).                        - Resume previous diet.                        - Continue present medications.                        - Repeat colonoscopy in 1 year for surveillance. Procedure Code(s):     --- Professional ---                        [phone], Colonoscopy through stoma; diagnostic,                         including collection of specimen(s) by brushing or                         washing, when performed (separate procedure) Diagnosis Code(s):     --- Professional ---                        Q03.474, Personal history of other malignant neoplasm                         of rectum, rectosigmoid junction, and anus CPT copyright 2019 American Medical Association. All rights reserved. The codes documented in this report are preliminary and upon coder review may  be revised to meet current compliance requirements. [name], MD [name] MD, MD 07/15/2020 8:17:54 AM This report has been signed electronically. Number of Addenda: 0 Note Initiated On: 07/15/2020 8:01 AM Scope Withdrawal Time: 0 hours 7 minutes 5 seconds  Total Procedure Duration: 0 hours 9 minutes 15 seconds  Estimated Blood Loss:  Estimated blood loss: none.      [facility]

## 2020-07-15 NOTE — Anesthesia Procedure Notes (Signed)
Performed by: Cook-Martin, Gibson Lad Pre-anesthesia Checklist: Patient identified, Emergency Drugs available, Suction available, Patient being monitored and Timeout performed Patient Re-evaluated:Patient Re-evaluated prior to induction Oxygen Delivery Method: Nasal cannula Preoxygenation: Pre-oxygenation with 100% oxygen Induction Type: IV induction Placement Confirmation: positive ETCO2 and CO2 detector       

## 2020-07-15 NOTE — Progress Notes (Signed)
   07/15/20 0750  Clinical Encounter Type  Visited With Family  Visit Type Initial  Referral From Chaplain  Consult/Referral To Chaplain  While rounding SDS waiting area, chaplain briefly visited with Pt's daughter, to see how she was doing while waiting. Daughter said she didn't have any questions or concerns.

## 2020-07-15 NOTE — H&P (Signed)
Jacqueline Bellows, MD [address] [place], [place], [place], [address] [address], [place], [place], [place], [address] Phone: [phone]  Fax: [phone]  Primary Care Physician:  Jacqueline Ruths, MD   Pre-Procedure History & Physical: HPI:  Jacqueline Yoder is a 74 y.o. female is here for an colonoscopy.   Past Medical History:  Diagnosis Date  . Arthritis   . GERD (gastroesophageal reflux disease)   . Hypercholesteremia   . Hypertension   . Hypertension   . Lower extremity edema   . Rectal cancer ([place]) 12/2018  . Urinary incontinence     Past Surgical History:  Procedure Laterality Date  . CHOLECYSTECTOMY  1971  . COLONOSCOPY WITH PROPOFOL N/A 12/03/2018   Procedure: COLONOSCOPY WITH PROPOFOL;  Surgeon: Jacqueline Lame, MD;  Location: [facility] ENDOSCOPY;  Service: Endoscopy;  Laterality: N/A;  . FLEXIBLE SIGMOIDOSCOPY N/A 12/06/2018   Procedure: FLEXIBLE SIGMOIDOSCOPY;  Surgeon: Jacqueline Bellows, MD;  Location: [facility] ENDOSCOPY;  Service: Endoscopy;  Laterality: N/A;  . LAPAROSCOPIC COLOSTOMY  01/06/2019  . PORTACATH PLACEMENT N/A 04/03/2019   Procedure: INSERTION PORT-A-CATH;  Surgeon: Jacqueline Husbands, MD;  Location: [facility] ORS;  Service: General;  Laterality: N/A;    Prior to Admission medications   Medication Sig Start Date End Date Taking? Authorizing Provider  Cholecalciferol (VITAMIN D3) 2000 units capsule Take 2,000 Units by mouth daily.   Yes [provider]  Cinnamon Bark POWD Take by mouth.    Yes [provider]  fexofenadine (ALLEGRA) 180 MG tablet Take 180 mg by mouth daily.   Yes [provider]  Multiple Vitamins-Minerals (ONE-A-DAY WOMENS 50 PLUS PO) Take 1 tablet by mouth daily.    Yes [provider]  potassium chloride SA (K-DUR) 20 MEQ tablet Take 20 mEq by mouth daily.   Yes [provider]  simvastatin (ZOCOR) 40 MG tablet Take 40 mg by mouth at bedtime.  08/09/18  Yes [provider]    triamterene-hydrochlorothiazide (DYAZIDE) 37.5-25 MG capsule Take 1 capsule by mouth daily.  10/05/17  Yes [provider]  apixaban (ELIQUIS) 2.5 MG TABS tablet Take 1 tablet (2.5 mg total) by mouth 2 (two) times daily. 06/25/20   Jacqueline Server, MD  chlorhexidine (PERIDEX) 0.12 % solution Use as directed 15 mLs in the mouth or throat 2 (two) times daily. 04/23/19   Jacqueline Server, MD  diclofenac sodium (VOLTAREN) 1 % GEL Apply 2 g topically 4 (four) times daily as needed (joint pain).  Patient not taking: Reported on 06/25/2020 05/09/18   [provider]  docusate sodium (COLACE) 100 MG capsule Take 100 mg by mouth 2 (two) times daily.    [provider]  FIBER ADULT GUMMIES PO Take 1 Dose by mouth daily. Patient not taking: Reported on 06/25/2020    [provider]  fluticasone (FLONASE) 50 MCG/ACT nasal spray Place 1 spray into both nostrils 2 (two) times a day.  05/07/18   [provider]  gabapentin (NEURONTIN) 100 MG capsule Take 1 capsule (100 mg total) by mouth at bedtime. Patient not taking: Reported on 06/25/2020 10/06/19   Jacqueline Server, MD  HYDROcodone-acetaminophen (NORCO/VICODIN) 5-325 MG tablet Take 1 tablet by mouth every 4 (four) hours as needed for moderate pain. Patient not taking: Reported on 06/25/2020 04/03/19   Jacqueline Hamman F, MD  ipratropium (ATROVENT) 0.03 % nasal spray  11/06/19   [provider]  ketorolac (ACULAR) 0.4 % SOLN Place 1 drop into both eyes daily.  Patient not  taking: Reported on 06/25/2020 06/26/18   [provider]  lidocaine-prilocaine (EMLA) cream Apply 1 application topically as needed. 04/03/19   Jacqueline Server, MD  loperamide (IMODIUM) 2 MG capsule Take 1 capsule (2 mg total) by mouth See admin instructions. With onset of loose stool, take 4mg  followed by 2mg  every 2 hours,  Maximum: 16 mg/day Patient not taking: Reported on 06/25/2020 03/07/19   Jacqueline Server, MD  Lutein 40 MG CAPS Take 40 mg by mouth daily.  Patient not  taking: Reported on 06/25/2020    [provider]  Lutein-Bilberry (BILBERRY PLUS LUTEIN) 02-999 MCG-MG CAPS Take 1 capsule by mouth daily.  Patient not taking: Reported on 06/25/2020 03/19/17   [provider]  magic mouthwash w/lidocaine SOLN Take 5 mLs by mouth 4 (four) times daily as needed for mouth pain. Sig: Swish/Swallow 5-10 ml four times a day as needed Patient not taking: Reported on 06/25/2020 10/13/19   Jacqueline Server, MD  ondansetron (ZOFRAN) 8 MG tablet Take 1 tablet (8 mg total) by mouth every 8 (eight) hours as needed for nausea, vomiting or refractory nausea / vomiting. Patient not taking: Reported on 06/25/2020 03/13/19   Jacqueline Server, MD  Ospemifene [facility]) 60 MG TABS Take 1 tablet by mouth daily. Patient not taking: Reported on 06/25/2020 06/04/20   Jacqueline Yoder, CNM  pantoprazole (PROTONIX) 40 MG tablet Take 1 tablet (40 mg total) by mouth as needed. Patient not taking: Reported on 06/25/2020 03/13/19   Jacqueline Server, MD  prednisoLONE acetate (PRED FORTE) 1 % ophthalmic suspension Place 1 drop into both eyes 2 (two) times a day.  07/23/18   [provider]  prochlorperazine (COMPAZINE) 10 MG tablet Take 1 tablet (10 mg total) by mouth every 6 (six) hours as needed (NAUSEA). 07/09/20   Jacqueline Server, MD    Allergies as of 06/23/2020 - Review Complete 06/23/2020  Allergen Reaction Noted  . Sulfamethoxazole-trimethoprim Other (See Comments) 02/12/2015    Family History  Problem Relation Age of Onset  . Colon cancer Brother   . Prostate cancer Brother   . Hypertension Mother   . Stroke Mother   . Kidney failure Father   . Breast cancer Neg Hx   . Ovarian cancer Neg Hx     Social History   Socioeconomic History  . Marital status: Married    Spouse name: Not on file  . Number of children: Not on file  . Years of education: Not on file  . Highest education level: Not on file  Occupational History  . Not on file  Tobacco Use  . Smoking status: Former  Smoker    Quit date: 12/02/1977    Years since quitting: 42.6  . Smokeless tobacco: Never Used  Vaping Use  . Vaping Use: Never used  Substance and Sexual Activity  . Alcohol use: Never  . Drug use: Never  . Sexual activity: Not Currently    Birth control/protection: None  Other Topics Concern  . Not on file  Social History Narrative   Lives with daughter   Social Determinants of Health   Financial Resource Strain:   . Difficulty of Paying Living Expenses: Not on file  Food Insecurity:   . Worried About Charity fundraiser in the Last Year: Not on file  . Ran Out of Food in the Last Year: Not on file  Transportation Needs:   . Lack of Transportation (Medical): Not on file  . Lack of Transportation (Non-Medical): Not on file  Physical Activity:   . Days of Exercise per Week: Not on file  . Minutes of Exercise per Session: Not on file  Stress:   . Feeling of Stress : Not on file  Social Connections:   . Frequency of Communication with Friends and Family: Not on file  . Frequency of Social Gatherings with Friends and Family: Not on file  . Attends Religious Services: Not on file  . Active Member of Clubs or Organizations: Not on file  . Attends Archivist Meetings: Not on file  . Marital Status: Not on file  Intimate Partner Violence:   . Fear of Current or Ex-Partner: Not on file  . Emotionally Abused: Not on file  . Physically Abused: Not on file  . Sexually Abused: Not on file    Review of Systems: See HPI, otherwise negative ROS  Physical Exam: BP 133/78   Pulse (!) 110   Temp (!) 97.4 Yoder (36.3 C) (Temporal)   Resp 16   Ht 5\' 5"  (1.651 m)   Wt 74 kg   SpO2 98%   BMI 27.15 kg/m  General:   Alert,  pleasant and cooperative in NAD Head:  Normocephalic and atraumatic. Neck:  Supple; no masses or thyromegaly. Lungs:  Clear throughout to auscultation, normal respiratory effort.    Heart:  +S1, +S2, Regular rate and rhythm, No edema. Abdomen:  Soft,  nontender and nondistended. Normal bowel sounds, without guarding, and without rebound.   Neurologic:  Alert and  oriented x4;  grossly normal neurologically.  Impression/Plan: Jacqueline Yoder is here for an colonoscopy to be performed for surveillance due to prior history of rectal cancer  Risks, benefits, limitations, and alternatives regarding  colonoscopy have been reviewed with the patient.  Questions have been answered.  All parties agreeable.   Jacqueline Bellows, MD  07/15/2020, 7:59 AM

## 2020-07-15 NOTE — Anesthesia Postprocedure Evaluation (Signed)
Anesthesia Post Note  Patient: Jacqueline Yoder  Procedure(s) Performed: COLONOSCOPY WITH PROPOFOL (N/A )  Patient location during evaluation: Endoscopy Anesthesia Type: General Level of consciousness: awake and alert Pain management: pain level controlled Vital Signs Assessment: post-procedure vital signs reviewed and stable Respiratory status: spontaneous breathing, nonlabored ventilation, respiratory function stable and patient connected to nasal cannula oxygen Cardiovascular status: blood pressure returned to baseline and stable Postop Assessment: no apparent nausea or vomiting Anesthetic complications: no   No complications documented.   Last Vitals:  Vitals:   07/15/20 0840 07/15/20 0850  BP: (!) 104/49 (!) 107/54  Pulse: 64 65  Resp: 18 15  Temp:    SpO2: 100% 100%    Last Pain:  Vitals:   07/15/20 0810  TempSrc: Temporal  PainSc:                  Arita Miss

## 2020-07-15 NOTE — Transfer of Care (Signed)
Immediate Anesthesia Transfer of Care Note  Patient: Jacqueline Yoder  Procedure(s) Performed: COLONOSCOPY WITH PROPOFOL (N/A )  Patient Location: PACU  Anesthesia Type:General  Level of Consciousness: awake and sedated  Airway & Oxygen Therapy: Patient Spontanous Breathing and Patient connected to nasal cannula oxygen  Post-op Assessment: Report given to RN and Post -op Vital signs reviewed and stable  Post vital signs: Reviewed and stable  Last Vitals:  Vitals Value Taken Time  BP 100/72 07/15/20 0819  Temp    Pulse 75 07/15/20 0819  Resp 10 07/15/20 0819  SpO2 99 % 07/15/20 0819  Vitals shown include unvalidated device data.  Last Pain:  Vitals:   07/15/20 0727  TempSrc: Temporal  PainSc: 0-No pain         Complications: No complications documented.

## 2020-07-15 NOTE — Anesthesia Preprocedure Evaluation (Signed)
Anesthesia Evaluation  Patient identified by MRN, date of birth, ID band Patient awake    Reviewed: Allergy & Precautions, H&P , NPO status , Patient's Chart, lab work & pertinent test results, reviewed documented beta blocker date and time   History of Anesthesia Complications Negative for: history of anesthetic complications  Airway Mallampati: II  TM Distance: >3 FB Neck ROM: full    Dental  (+) Upper Dentures, Poor Dentition   Pulmonary neg pulmonary ROS, neg sleep apnea, neg COPD, Patient abstained from smoking.Not current smoker, former smoker,    Pulmonary exam normal breath sounds clear to auscultation       Cardiovascular Exercise Tolerance: Good METShypertension, (-) CAD and (-) Past MI Normal cardiovascular exam(-) dysrhythmias  Rhythm:Regular Rate:Normal - Systolic murmurs    Neuro/Psych negative neurological ROS  negative psych ROS   GI/Hepatic GERD  Controlled,(+)     (-) substance abuse  , Rectal cancer s/p large abdominal surgery   Endo/Other  neg diabetes  Renal/GU Renal diseasenegative Renal ROS     Musculoskeletal  (+) Arthritis ,   Abdominal   Peds  Hematology   Anesthesia Other Findings Past Medical History: No date: Arthritis No date: Cancer ([place])     Comment:  rectal No date: GERD (gastroesophageal reflux disease) No date: Hypercholesteremia No date: Hypertension No date: Hypertension No date: Lower extremity edema No date: Urinary incontinence  Past Surgical History: 1971: CHOLECYSTECTOMY 12/03/2018: COLONOSCOPY WITH PROPOFOL; N/A     Comment:  Procedure: COLONOSCOPY WITH PROPOFOL;  Surgeon: Lucilla Lame, MD;  Location: [facility] ENDOSCOPY;  Service:               Endoscopy;  Laterality: N/A; 12/06/2018: FLEXIBLE SIGMOIDOSCOPY; N/A     Comment:  Procedure: FLEXIBLE SIGMOIDOSCOPY;  Surgeon: [name], MD;  Location: [facility] ENDOSCOPY;  Service:                Endoscopy;  Laterality: N/A; 01/06/2019: LAPAROSCOPIC COLOSTOMY  BMI    Body Mass Index: 35.45 kg/m      Reproductive/Obstetrics                             Anesthesia Physical  Anesthesia Plan  ASA: III  Anesthesia Plan: General   Post-op Pain Management:    Induction: Intravenous  PONV Risk Score and Plan: 3 and Treatment may vary due to age or medical condition, TIVA and Ondansetron  Airway Management Planned: Nasal Cannula and Natural Airway  Additional Equipment: None  Intra-op Plan:   Post-operative Plan:   Informed Consent: I have reviewed the patients History and Physical, chart, labs and discussed the procedure including the risks, benefits and alternatives for the proposed anesthesia with the patient or authorized representative who has indicated his/her understanding and acceptance.     Dental Advisory Given  Plan Discussed with: CRNA  Anesthesia Plan Comments: (Discussed risks of anesthesia with patient, including possibility of difficulty with spontaneous ventilation under anesthesia necessitating airway intervention, PONV, and rare risks such as cardiac or respiratory or neurological events. Patient understands.)        Anesthesia Quick Evaluation

## 2020-07-16 ENCOUNTER — Inpatient Hospital Stay: Payer: Medicare Other

## 2020-07-16 ENCOUNTER — Other Ambulatory Visit: Payer: Self-pay

## 2020-07-16 ENCOUNTER — Inpatient Hospital Stay (HOSPITAL_BASED_OUTPATIENT_CLINIC_OR_DEPARTMENT_OTHER): Payer: Medicare Other | Admitting: Oncology

## 2020-07-16 ENCOUNTER — Inpatient Hospital Stay: Payer: Medicare Other | Attending: Oncology

## 2020-07-16 ENCOUNTER — Telehealth: Payer: Self-pay

## 2020-07-16 ENCOUNTER — Encounter: Payer: Self-pay | Admitting: Gastroenterology

## 2020-07-16 VITALS — BP 125/75 | HR 76 | Temp 97.0°F | Resp 16 | Wt 161.1 lb

## 2020-07-16 VITALS — BP 112/56 | HR 67 | Temp 97.3°F | Resp 17

## 2020-07-16 DIAGNOSIS — Z87891 Personal history of nicotine dependence: Secondary | ICD-10-CM | POA: Insufficient documentation

## 2020-07-16 DIAGNOSIS — I700 Atherosclerosis of aorta: Secondary | ICD-10-CM | POA: Diagnosis not present

## 2020-07-16 DIAGNOSIS — M129 Arthropathy, unspecified: Secondary | ICD-10-CM | POA: Insufficient documentation

## 2020-07-16 DIAGNOSIS — N939 Abnormal uterine and vaginal bleeding, unspecified: Secondary | ICD-10-CM | POA: Diagnosis not present

## 2020-07-16 DIAGNOSIS — E7800 Pure hypercholesterolemia, unspecified: Secondary | ICD-10-CM | POA: Diagnosis not present

## 2020-07-16 DIAGNOSIS — Z7189 Other specified counseling: Secondary | ICD-10-CM

## 2020-07-16 DIAGNOSIS — Z5111 Encounter for antineoplastic chemotherapy: Secondary | ICD-10-CM

## 2020-07-16 DIAGNOSIS — Z23 Encounter for immunization: Secondary | ICD-10-CM | POA: Insufficient documentation

## 2020-07-16 DIAGNOSIS — Z86718 Personal history of other venous thrombosis and embolism: Secondary | ICD-10-CM | POA: Diagnosis not present

## 2020-07-16 DIAGNOSIS — C541 Malignant neoplasm of endometrium: Secondary | ICD-10-CM | POA: Insufficient documentation

## 2020-07-16 DIAGNOSIS — R197 Diarrhea, unspecified: Secondary | ICD-10-CM

## 2020-07-16 DIAGNOSIS — C20 Malignant neoplasm of rectum: Secondary | ICD-10-CM

## 2020-07-16 DIAGNOSIS — Z85048 Personal history of other malignant neoplasm of rectum, rectosigmoid junction, and anus: Secondary | ICD-10-CM | POA: Insufficient documentation

## 2020-07-16 DIAGNOSIS — I10 Essential (primary) hypertension: Secondary | ICD-10-CM | POA: Diagnosis not present

## 2020-07-16 DIAGNOSIS — Z8542 Personal history of malignant neoplasm of other parts of uterus: Secondary | ICD-10-CM | POA: Insufficient documentation

## 2020-07-16 DIAGNOSIS — Z800 Family history of malignant neoplasm of digestive organs: Secondary | ICD-10-CM | POA: Insufficient documentation

## 2020-07-16 DIAGNOSIS — Z79899 Other long term (current) drug therapy: Secondary | ICD-10-CM | POA: Diagnosis not present

## 2020-07-16 DIAGNOSIS — Z86711 Personal history of pulmonary embolism: Secondary | ICD-10-CM | POA: Diagnosis not present

## 2020-07-16 DIAGNOSIS — R609 Edema, unspecified: Secondary | ICD-10-CM | POA: Diagnosis not present

## 2020-07-16 DIAGNOSIS — K219 Gastro-esophageal reflux disease without esophagitis: Secondary | ICD-10-CM | POA: Insufficient documentation

## 2020-07-16 DIAGNOSIS — E876 Hypokalemia: Secondary | ICD-10-CM

## 2020-07-16 LAB — CBC WITH DIFFERENTIAL/PLATELET
Abs Immature Granulocytes: 0.01 10*3/uL (ref 0.00–0.07)
Basophils Absolute: 0 10*3/uL (ref 0.0–0.1)
Basophils Relative: 0 %
Eosinophils Absolute: 0.2 10*3/uL (ref 0.0–0.5)
Eosinophils Relative: 3 %
HCT: 34.6 % — ABNORMAL LOW (ref 36.0–46.0)
Hemoglobin: 11.7 g/dL — ABNORMAL LOW (ref 12.0–15.0)
Immature Granulocytes: 0 %
Lymphocytes Relative: 29 %
Lymphs Abs: 1.4 10*3/uL (ref 0.7–4.0)
MCH: 30.2 pg (ref 26.0–34.0)
MCHC: 33.8 g/dL (ref 30.0–36.0)
MCV: 89.4 fL (ref 80.0–100.0)
Monocytes Absolute: 0.5 10*3/uL (ref 0.1–1.0)
Monocytes Relative: 9 %
Neutro Abs: 2.9 10*3/uL (ref 1.7–7.7)
Neutrophils Relative %: 59 %
Platelets: 256 10*3/uL (ref 150–400)
RBC: 3.87 MIL/uL (ref 3.87–5.11)
RDW: 13.1 % (ref 11.5–15.5)
WBC: 5 10*3/uL (ref 4.0–10.5)
nRBC: 0 % (ref 0.0–0.2)

## 2020-07-16 LAB — COMPREHENSIVE METABOLIC PANEL
ALT: 14 U/L (ref 0–44)
AST: 24 U/L (ref 15–41)
Albumin: 3.6 g/dL (ref 3.5–5.0)
Alkaline Phosphatase: 41 U/L (ref 38–126)
Anion gap: 10 (ref 5–15)
BUN: 9 mg/dL (ref 8–23)
CO2: 29 mmol/L (ref 22–32)
Calcium: 9.2 mg/dL (ref 8.9–10.3)
Chloride: 101 mmol/L (ref 98–111)
Creatinine, Ser: 0.87 mg/dL (ref 0.44–1.00)
GFR calc Af Amer: >60 mL/min (ref >60)
GFR calc non Af Amer: >60 mL/min (ref >60)
Glucose, Bld: 114 mg/dL — ABNORMAL HIGH (ref 70–99)
Potassium: 3.2 mmol/L — ABNORMAL LOW (ref 3.5–5.1)
Sodium: 140 mmol/L (ref 135–145)
Total Bilirubin: 0.5 mg/dL (ref 0.3–1.2)
Total Protein: 6.9 g/dL (ref 6.5–8.1)

## 2020-07-16 LAB — URINALYSIS, ROUTINE W REFLEX MICROSCOPIC
Bacteria, UA: NONE SEEN
Bilirubin Urine: NEGATIVE
Glucose, UA: NEGATIVE mg/dL
Ketones, ur: NEGATIVE mg/dL
Nitrite: NEGATIVE
Protein, ur: NEGATIVE mg/dL
RBC / HPF: >50 RBC/hpf — ABNORMAL HIGH (ref 0–5)
Specific Gravity, Urine: 1.006 (ref 1.005–1.030)
Squamous Epithelial / HPF: NONE SEEN (ref 0–5)
pH: 8 (ref 5.0–8.0)

## 2020-07-16 MED ORDER — METHYLPREDNISOLONE SODIUM SUCC 125 MG IJ SOLR
125.0000 mg | Freq: Once | INTRAMUSCULAR | Status: AC
  Administered 2020-07-16: 125 mg via INTRAVENOUS

## 2020-07-16 MED ORDER — ATROPINE SULFATE 1 MG/ML IJ SOLN
0.5000 mg | Freq: Once | INTRAMUSCULAR | Status: AC | PRN
  Administered 2020-07-16: 0.5 mg via INTRAVENOUS
  Filled 2020-07-16: qty 1

## 2020-07-16 MED ORDER — PALONOSETRON HCL INJECTION 0.25 MG/5ML
0.2500 mg | Freq: Once | INTRAVENOUS | Status: AC
  Administered 2020-07-16: 0.25 mg via INTRAVENOUS
  Filled 2020-07-16: qty 5

## 2020-07-16 MED ORDER — DIPHENHYDRAMINE HCL 50 MG/ML IJ SOLN
25.0000 mg | Freq: Once | INTRAMUSCULAR | Status: AC
  Administered 2020-07-16: 25 mg via INTRAVENOUS

## 2020-07-16 MED ORDER — LOPERAMIDE HCL 2 MG PO CAPS
2.0000 mg | ORAL_CAPSULE | ORAL | 1 refills | Status: DC
Start: 2020-07-16 — End: 2021-01-31

## 2020-07-16 MED ORDER — SODIUM CHLORIDE 0.9 % IV SOLN
Freq: Once | INTRAVENOUS | Status: AC
  Filled 2020-07-16: qty 250

## 2020-07-16 MED ORDER — SODIUM CHLORIDE 0.9 % IV SOLN
150.0000 mg/m2 | Freq: Once | INTRAVENOUS | Status: AC
  Administered 2020-07-16: 280 mg via INTRAVENOUS
  Filled 2020-07-16: qty 14

## 2020-07-16 MED ORDER — FAMOTIDINE IN NACL 20-0.9 MG/50ML-% IV SOLN
20.0000 mg | Freq: Once | INTRAVENOUS | Status: AC
  Administered 2020-07-16: 20 mg via INTRAVENOUS

## 2020-07-16 MED ORDER — POTASSIUM CHLORIDE 20 MEQ/100ML IV SOLN
20.0000 meq | Freq: Once | INTRAVENOUS | Status: AC
  Administered 2020-07-16: 20 meq via INTRAVENOUS

## 2020-07-16 MED ORDER — FLUOROURACIL CHEMO INJECTION 2.5 GM/50ML
400.0000 mg/m2 | Freq: Once | INTRAVENOUS | Status: AC
  Administered 2020-07-16: 750 mg via INTRAVENOUS
  Filled 2020-07-16: qty 15

## 2020-07-16 MED ORDER — LIDOCAINE-PRILOCAINE 2.5-2.5 % EX CREA: 1.0000 "application " | TOPICAL_CREAM | CUTANEOUS | 6 refills | Status: DC | PRN

## 2020-07-16 MED ORDER — SODIUM CHLORIDE 0.9 % IV SOLN
2400.0000 mg/m2 | INTRAVENOUS | Status: DC
  Administered 2020-07-16: 4400 mg via INTRAVENOUS
  Filled 2020-07-16: qty 88

## 2020-07-16 MED ORDER — SODIUM CHLORIDE 0.9 % IV SOLN: 2400.0000 mg/m2 | INTRAVENOUS | Status: DC

## 2020-07-16 MED ORDER — SODIUM CHLORIDE 0.9 % IV SOLN
10.0000 mg | Freq: Once | INTRAVENOUS | Status: AC
  Administered 2020-07-16: 10 mg via INTRAVENOUS
  Filled 2020-07-16: qty 10

## 2020-07-16 MED ORDER — ONDANSETRON HCL 8 MG PO TABS: 8.0000 mg | ORAL_TABLET | Freq: Three times a day (TID) | ORAL | 1 refills | Status: DC | PRN

## 2020-07-16 MED ORDER — SODIUM CHLORIDE 0.9 % IV SOLN
408.0000 mg/m2 | Freq: Once | INTRAVENOUS | Status: AC
  Administered 2020-07-16: 750 mg via INTRAVENOUS
  Filled 2020-07-16: qty 25

## 2020-07-16 NOTE — Progress Notes (Signed)
1229: when shirt was lowered to check Port dressing, Hives were noticed across chest. Hives present on chest, abdomen, back, and bilateral arms. Pt denies any other symptoms and was not aware of the hives until asked.   NS started to gravity, Faythe Casa NP aware and at chairside, Medications given per order (**see MAR**).  1250: Per Faythe Casa NP, observe patient until 1315, if symptoms do not worsen and pt remains stable okay to d/c pt home. Verbal and written instructions to take 25 mg PO benadryl every 8 hours (starting after 9pm on 07/16/20)  and 20mg  PO Pepcid every 12 hours(starting 07/17/20) per Faythe Casa NP, pt verbalizes understanding.   1300: Adrucil Pump started. (Per Faythe Casa NP per Dr. Tasia Catchings okay to send pt home with Adrucil pump). Verbal instructions given for the pump. Pt verbalizes understanding.  1315: Hives improved but still present. Pt continues to deny any other symptoms, Pt educated to report to ER/call 911 in the event of an emergency or call clinic with any questions. Pt verbalizes understanding.  Pt and VS stable at discharge.  VS remained stable. **see flow sheets**

## 2020-07-16 NOTE — Addendum Note (Signed)
Addended by: Earlie Server on: 07/16/2020 09:27 AM   Modules accepted: Orders

## 2020-07-16 NOTE — Telephone Encounter (Signed)
Per [place] from [place]: Pt stated that she wants her Infusion date sched for [facility]. she does not want to wear the pump over the weekend and ask if her sched 10/15 date can be move to 10/13.   I called and spoke to patient and notified her that Dr. Tasia Catchings recommends for her to keep treatment appointment as scheduled and they can discuss this at the next visit on 10/8. Depending on PET and Rad onc consult, plan may change. Pt voiced understanding.  Gave patient appts for PET and Dr. Oren Bracket.

## 2020-07-16 NOTE — Progress Notes (Signed)
Patient denies new problems/concerns today.   °

## 2020-07-16 NOTE — Progress Notes (Addendum)
Hematology/Oncology progress note [facility] [phone] [phone]   Patient Care Team: Kirk Ruths, MD as PCP - General (Internal Medicine) Clent Jacks, RN as Registered Nurse  REFERRING PROVIDER: Dr.Secord CHIEF COMPLAINTS/REASON FOR VISIT:  Follow up for rectal cancer  HISTORY OF PRESENTING ILLNESS:  Jacqueline Yoder is a  74 y.o.  female with PMH listed below who was referred to me for evaluation of endometrial cancer.   Patient initially presented with complaints of postmenopausal bleeding on 08/16/2018.  History of was menopausal vaginal bleeding in 2016 which resulted in cervical polypectomy.  Pathology 02/04/2015 showed cervical polyp, consistent with benign endometrial polyp.  [name] after polypectomy due to anxiety associated with pelvic exams.  pelvic exam on 08/16/2018 reviewed cervical abnormality and from enlarged uterus. Seen by Dr. Marcelline Mates on 10/29/2018.  Endometrial biopsy and a Pap smear was performed. 10/29/2018 Pap smear showed adenocarcinoma, favor endometrial origin. 10/29/2018 endometrial biopsy showed endometrioid carcinoma, FIGO grade 1.  10/29/2018- TA & TV Ultrasound revealed: Anteverted uterus measuring 8.7 x 5.6 x 6.4 cm without evidence of focal masses.  The endometrium measuring 24.1 mm (thickened) and heterogeneous.  Right and left ovaries not visualized.  No adnexal masses identified.  No free fluid in cul-de-sac.  Patient was seen by Dr. Theora Gianotti in clinic on 11/13/2018.  Cervical exam reveals 2 cm exophytic irregular mass consistent with malignancy.   11/19/2018 CT chest abdomen pelvis with contrast showed thickened endometrium with some irregularity compatible with the provided diagnosis of endometrial malignancy.  There is a mildly prominent left inguinal node 1.4 cm.  Patient was seen by Dr. Fransisca Connors on 11/20/2018 and left groin lymph node biopsy was recommended.  11/26/2018 patient underwent  left inguinal lymph node biopsy. Pathology showed metastatic adenocarcinoma consistent with colorectal origin.  CDX 2+.  Case was discussed on tumor board.  Recommend colonoscopy for further evaluation.  Patient reports significant weight loss 30 pounds over the last year.  Chronic vaginal spotting. Change of bowel habits the past few months.  More constipated.  Family history positive for brother who has colon cancer prostate cancer.  patient has underwent colonoscopy on 12/03/2018 which reviewed a nonobstructing large mass in the rectum.  Also chronic fistula.  Mass was not circumferential.  This was biopsied with a cold forceps for histology.  Pathology came back hyperplastic polyp negative for dysplasia and malignancy. [name], patient underwent flex sigmoidoscopy on 12/06/2018 with rebiopsy of the rectal mass. This time biopsy results came back positive for invasive colorectal adenocarcinoma, moderately differentiated. Immunotherapy for nearly mismatch repair protein (MMR ) was performed.  There is no loss of MMR expression.  low probability of MSI high.   # Seen by [name] for evaluation of resectability for rectal cancer. In addition, she also had a second opinion with [facility] where her endometrial biopsy pathology was changed to  adenocarcinoma, consistent with colorectal primary.   [name]. She has home health that has been assisting with ostomy care  Patient was also evaluated by [facility] oncology.  Recommendation is to proceed with TNT with concurrent chemoradiation followed by neoadjuvant chemotherapy followed by surgical resection. Patient prefers to have treatment done locally with [facility].   # Oncology Treatment:  02/03/2019- 03/19/2019  concurrent Xeloda and radiation.  Xeloda dose 855m /m2 BID - rounded to 16555mBID- on days of radiation. 04/09/2019, started on FOLFOX with bolus 5-FU omitted.  07/16/2019  finished  8 cycles of FOLFOX.  # Patient has underwent 09/17/19 APR/posterior vaginectomy/TAH/BSO/VY-flap pT4b pN0 with close vaginal margin 0.2 mm.  Uterus and ovaries negative for malignancy. Patient reports bilateral lower extremity numbness and tingling, intermittent, left worse than right. She has lost a lot of weight since her APR surgery.   Surveillance plan Recommend history and physical/tumor marker monitoring every 3 to 6 months for the first 2 years after surgery and then every 6 months for a total of 5 years. Chest abdomen pelvis every 6 to 12 months for total 5 years. Colonoscopy 1 year after surgery, if no advanced adenoma, repeat in 3 years and then 5 years.  #Family history with half brother having's history of colon cancer prostate cancer.  Personal history of colorectal cancer.  Patient has not decided if she wants genetic testing.   # vaginal introitus mass biopsied. Pathology is consistent with metastatic colorectal adenocarcinoma  # history of PE( 01/13/2020)  in the bilateral lower extremity DVT (01/13/2020).   She finishes 6 months of anticoagulation with Eliquis 5 mg twice daily. Now switched to Eliquis 2.5 mg twice daily..  INTERVAL HISTORY Jacqueline Yoder is a 74 y.o. female who has above history reviewed by me presents for follow-up of rectal cancer. Patient has no new complaints. #Patient continues to have intermittent vaginal spotting. Patient is taking Eliquis 2.5 mg twice daily  Review of Systems  Constitutional: Negative for appetite change, chills, fatigue, fever and unexpected weight change.  HENT:   Negative for hearing loss and voice change.   Eyes: Negative for eye problems.  Respiratory: Negative for chest tightness and cough.   Cardiovascular: Negative for chest pain.  Gastrointestinal: Negative for abdominal distention, abdominal pain, blood in stool, constipation, diarrhea and nausea.  Endocrine: Negative for hot flashes.  Genitourinary: Negative  for difficulty urinating and frequency.   Musculoskeletal: Negative for arthralgias.  Skin: Negative for itching and rash.  Neurological: Negative for extremity weakness and numbness.  Hematological: Negative for adenopathy.  Psychiatric/Behavioral: Negative for confusion.    MEDICAL HISTORY:  Past Medical History:  Diagnosis Date  . Arthritis   . GERD (gastroesophageal reflux disease)   . Hypercholesteremia   . Hypertension   . Hypertension   . Lower extremity edema   . Rectal cancer ([place]) 12/2018  . Urinary incontinence     SURGICAL HISTORY: Past Surgical History:  Procedure Laterality Date  . CHOLECYSTECTOMY  1971  . COLONOSCOPY WITH PROPOFOL N/A 12/03/2018   Procedure: COLONOSCOPY WITH PROPOFOL;  Surgeon: Lucilla Lame, MD;  Location: [facility] ENDOSCOPY;  Service: Endoscopy;  Laterality: N/A;  . COLONOSCOPY WITH PROPOFOL N/A 07/15/2020   Procedure: COLONOSCOPY WITH PROPOFOL;  Surgeon: [name], MD;  Location[facility] ENDOSCOPY;  Service: Gastroenterology;  Laterality: N/A;  . FLEXIBLE SIGMOIDOSCOPY N/A 12/06/2018   Procedure: FLEXIBLE SIGMOIDOSCOPY;  Surgeon: [name], MD;  Location: [facility] ENDOSCOPY;  Service: Endoscopy;  Laterality: N/A;  . LAPAROSCOPIC COLOSTOMY  01/06/2019  . PORTACATH PLACEMENT N/A 04/03/2019   Procedure: INSERTION PORT-A-CATH;  Surgeon: Jules Husbands, MD;  Location: [facility] ORS;  Service: General;  Laterality: N/A;    SOCIAL HISTORY: Social History   Socioeconomic History  . Marital status: Married    Spouse name: Not on file  . Number of children: Not on file  . Years of education: Not on file  . Highest education level: Not on file  Occupational History  . Not on file  Tobacco Use  . Smoking status: Former Audiological scientist  date: 12/02/1977    Years since quitting: 42.6  . Smokeless tobacco: Never Used  Vaping Use  . Vaping Use: Never used  Substance and Sexual Activity  . Alcohol use: Never  . Drug use: Never  . Sexual activity: Not Currently      Birth control/protection: None  Other Topics Concern  . Not on file  Social History Narrative   Lives with daughter   Social Determinants of Health   Financial Resource Strain:   . Difficulty of Paying Living Expenses: Not on file  Food Insecurity:   . Worried About Charity fundraiser in the Last Year: Not on file  . Ran Out of Food in the Last Year: Not on file  Transportation Needs:   . Lack of Transportation (Medical): Not on file  . Lack of Transportation (Non-Medical): Not on file  Physical Activity:   . Days of Exercise per Week: Not on file  . Minutes of Exercise per Session: Not on file  Stress:   . Feeling of Stress : Not on file  Social Connections:   . Frequency of Communication with Friends and Family: Not on file  . Frequency of Social Gatherings with Friends and Family: Not on file  . Attends Religious Services: Not on file  . Active Member of Clubs or Organizations: Not on file  . Attends Archivist Meetings: Not on file  . Marital Status: Not on file  Intimate Partner Violence:   . Fear of Current or Ex-Partner: Not on file  . Emotionally Abused: Not on file  . Physically Abused: Not on file  . Sexually Abused: Not on file    FAMILY HISTORY: Family History  Problem Relation Age of Onset  . Colon cancer Brother   . Prostate cancer Brother   . Hypertension Mother   . Stroke Mother   . Kidney failure Father   . Breast cancer Neg Hx   . Ovarian cancer Neg Hx     ALLERGIES:  is allergic to sulfamethoxazole-trimethoprim.  MEDICATIONS:  Current Outpatient Medications  Medication Sig Dispense Refill  . apixaban (ELIQUIS) 2.5 MG TABS tablet Take 1 tablet (2.5 mg total) by mouth 2 (two) times daily. 60 tablet 3  . chlorhexidine (PERIDEX) 0.12 % solution Use as directed 15 mLs in the mouth or throat 2 (two) times daily. 473 mL 3  . Cholecalciferol (VITAMIN D3) 2000 units capsule Take 2,000 Units by mouth daily.    Verneita Griffes Bark POWD Take by  mouth.     . diclofenac sodium (VOLTAREN) 1 % GEL Apply 2 g topically 4 (four) times daily as needed (joint pain).  (Patient not taking: Reported on 06/25/2020)  11  . docusate sodium (COLACE) 100 MG capsule Take 100 mg by mouth 2 (two) times daily.    . fexofenadine (ALLEGRA) 180 MG tablet Take 180 mg by mouth daily.    Marland Kitchen FIBER ADULT GUMMIES PO Take 1 Dose by mouth daily. (Patient not taking: Reported on 06/25/2020)    . fluticasone (FLONASE) 50 MCG/ACT nasal spray Place 1 spray into both nostrils 2 (two) times a day.     . gabapentin (NEURONTIN) 100 MG capsule Take 1 capsule (100 mg total) by mouth at bedtime. (Patient not taking: Reported on 06/25/2020) 90 capsule 0  . HYDROcodone-acetaminophen (NORCO/VICODIN) 5-325 MG tablet Take 1 tablet by mouth every 4 (four) hours as needed for moderate pain. (Patient not taking: Reported on 06/25/2020) 8 tablet 0  . ipratropium (ATROVENT) 0.03 %  nasal spray  (Patient not taking: Reported on 06/25/2020)    . ketorolac (ACULAR) 0.4 % SOLN Place 1 drop into both eyes daily.  (Patient not taking: Reported on 06/25/2020)    . lidocaine-prilocaine (EMLA) cream Apply 1 application topically as needed. 30 g 6  . loperamide (IMODIUM) 2 MG capsule Take 1 capsule (2 mg total) by mouth See admin instructions. With onset of loose stool, take $RemoveBef'4mg'rawfCicwrO$  followed by $RemoveBef'2mg'RuhTpLvmGn$  every 2 hours,  Maximum: 16 mg/day (Patient not taking: Reported on 06/25/2020) 120 capsule 1  . Lutein 40 MG CAPS Take 40 mg by mouth daily.  (Patient not taking: Reported on 06/25/2020)    . Lutein-Bilberry (BILBERRY PLUS LUTEIN) 02-999 MCG-MG CAPS Take 1 capsule by mouth daily.  (Patient not taking: Reported on 06/25/2020)    . magic mouthwash w/lidocaine SOLN Take 5 mLs by mouth 4 (four) times daily as needed for mouth pain. Sig: Swish/Swallow 5-10 ml four times a day as needed (Patient not taking: Reported on 06/25/2020) 480 mL 3  . Multiple Vitamins-Minerals (ONE-A-DAY WOMENS 50 PLUS PO) Take 1 tablet by mouth daily.      . ondansetron (ZOFRAN) 8 MG tablet Take 1 tablet (8 mg total) by mouth every 8 (eight) hours as needed for nausea, vomiting or refractory nausea / vomiting. (Patient not taking: Reported on 06/25/2020) 90 tablet 1  . Ospemifene (OSPHENA) 60 MG TABS Take 1 tablet by mouth daily. (Patient not taking: Reported on 06/25/2020) 30 tablet 2  . pantoprazole (PROTONIX) 40 MG tablet Take 1 tablet (40 mg total) by mouth as needed. (Patient not taking: Reported on 06/25/2020) 30 tablet 0  . potassium chloride SA (K-DUR) 20 MEQ tablet Take 20 mEq by mouth daily.    . prednisoLONE acetate (PRED FORTE) 1 % ophthalmic suspension Place 1 drop into both eyes 2 (two) times a day.   1  . prochlorperazine (COMPAZINE) 10 MG tablet Take 1 tablet (10 mg total) by mouth every 6 (six) hours as needed (NAUSEA). 30 tablet 1  . simvastatin (ZOCOR) 40 MG tablet Take 40 mg by mouth at bedtime.     . triamterene-hydrochlorothiazide (DYAZIDE) 37.5-25 MG capsule Take 1 capsule by mouth daily.      No current facility-administered medications for this visit.     PHYSICAL EXAMINATION: ECOG PERFORMANCE STATUS: 1 - Symptomatic but completely ambulatory Vitals:   07/16/20 0838  BP: 125/75  Pulse: 76  Resp: 16  Temp: (!) 97 F (36.1 C)   Filed Weights   07/16/20 0838  Weight: 161 lb 1.6 oz (73.1 kg)    Physical Exam Constitutional:      General: She is not in acute distress. HENT:     Head: Normocephalic and atraumatic.  Eyes:     General: No scleral icterus. Cardiovascular:     Rate and Rhythm: Normal rate and regular rhythm.     Heart sounds: Normal heart sounds.  Pulmonary:     Effort: Pulmonary effort is normal. No respiratory distress.     Breath sounds: No wheezing.  Abdominal:     General: Bowel sounds are normal. There is no distension.     Palpations: Abdomen is soft.     Comments: + Colostomy bag   Musculoskeletal:        General: No deformity. Normal range of motion.     Cervical back: Normal range  of motion and neck supple.  Skin:    General: Skin is warm and dry.     Findings:  No erythema or rash.  Neurological:     Mental Status: She is alert and oriented to person, place, and time. Mental status is at baseline.     Cranial Nerves: No cranial nerve deficit.     Coordination: Coordination normal.  Psychiatric:        Mood and Affect: Mood normal.    CMP Latest Ref Rng & Units 06/23/2020  Glucose 70 - 99 mg/dL 114(H)  BUN 8 - 23 mg/dL 10  Creatinine 0.44 - 1.00 mg/dL 0.97  Sodium 135 - 145 mmol/L 140  Potassium 3.5 - 5.1 mmol/L 3.7  Chloride 98 - 111 mmol/L 100  CO2 22 - 32 mmol/L 30  Calcium 8.9 - 10.3 mg/dL 9.1  Total Protein 6.5 - 8.1 g/dL 7.0  Total Bilirubin 0.3 - 1.2 mg/dL 0.4  Alkaline Phos 38 - 126 U/L 41  AST 15 - 41 U/L 25  ALT 0 - 44 U/L 15   CBC Latest Ref Rng & Units 06/23/2020  WBC 4.0 - 10.5 K/uL 4.1  Hemoglobin 12.0 - 15.0 g/dL 11.6(L)  Hematocrit 36 - 46 % 35.6(L)  Platelets 150 - 400 K/uL 231    LABORATORY DATA:  I have reviewed the data as listed Lab Results  Component Value Date   WBC 4.1 06/23/2020   HGB 11.6 (L) 06/23/2020   HCT 35.6 (L) 06/23/2020   MCV 91.3 06/23/2020   PLT 231 06/23/2020   Recent Labs    02/18/20 1339 03/19/20 0826 06/23/20 1124  NA 139 142 140  K 3.7 4.1 3.7  CL 102 102 100  CO2 $Re'28 31 30  'cBb$ GLUCOSE 103* 95 114*  BUN $Re'14 12 10  'XNW$ CREATININE 1.01* 1.01* 0.97  CALCIUM 9.3 9.9 9.1  GFRNONAA 56* 56* 58*  GFRAA >60 >60 >60  PROT 7.0 7.5 7.0  ALBUMIN 4.0 4.4 4.0  AST $Re'22 25 25  'aht$ ALT $R'14 16 15  'yI$ ALKPHOS 34* 37* 41  BILITOT 0.6 0.8 0.4   Iron/TIBC/Ferritin/ %Sat    Component Value Date/Time   IRON 40 06/23/2020 1124   TIBC 246 (L) 06/23/2020 1124   FERRITIN 338 (H) 06/23/2020 1124   IRONPCTSAT 16 06/23/2020 1124     RADIOGRAPHIC STUDIES: I have personally reviewed the radiological images as listed and agreed with the findings in the report. CT CHEST ABDOMEN PELVIS W CONTRAST  Result Date: 06/25/2020 CLINICAL  DATA:  History of rectal cancer and vaginal bleeding with elevated CEA EXAM: CT CHEST, ABDOMEN, AND PELVIS WITH CONTRAST TECHNIQUE: Multidetector CT imaging of the chest, abdomen and pelvis was performed following the standard protocol during bolus administration of intravenous contrast. CONTRAST:  65mL OMNIPAQUE IOHEXOL 300 MG/ML  SOLN COMPARISON:  Feb 25, 2020 FINDINGS: CT CHEST FINDINGS Cardiovascular: RIGHT-sided Port-A-Cath terminates in the RIGHT atrium as before. Heart size is stable without pericardial effusion. Engorgement of central pulmonary vasculature slightly greater than 3 cm. Mediastinum/Nodes: Thoracic inlet structures are normal. No axillary lymphadenopathy. No mediastinal lymphadenopathy. No hilar lymphadenopathy. Esophagus grossly normal. Lungs/Pleura: No suspicious pulmonary mass or nodule. Airways are patent. Tiny nodule along the fissure in the RIGHT lower lobe is stable at approximately 2-3 mm. The signs of lingular and RIGHT middle lobe scarring. Musculoskeletal: See below for full musculoskeletal detail. Spinal degenerative changes. No acute process involving the bony thorax. CT ABDOMEN PELVIS FINDINGS Hepatobiliary: No focal, suspicious hepatic lesion. Post cholecystectomy with less biliary duct distension that was evident on the prior study. Tiny hypodensity in the RIGHT hepatic lobe is stable. The  portal vein is patent. Pancreas: Pancreas without inflammation or focal lesion. Mild ductal distension unchanged. Spleen: Spleen normal in size and contour without focal lesion. Adrenals/Urinary Tract: Adrenal glands are normal. Cysts of the bilateral kidneys are unchanged. No hydronephrosis. Urinary bladder under distended. Stomach/Bowel: Signs of APR with LEFT lower quadrant colostomy. Fluid density along the RIGHT lateral margin of the colon in the ostomy has increased slowly over time. To a lesser extent this is true along the LEFT lateral margin. There is no stranding about the colon as it  passes through the abdominal wall. Greatest thickness of fluid at the level of the ostomy approximately 2 cm, previously approximately 1.4 cm. Vascular/Lymphatic: Calcified and noncalcified atheromatous plaque of the abdominal aorta. There is no gastrohepatic or hepatoduodenal ligament lymphadenopathy. No retroperitoneal or mesenteric lymphadenopathy. No pelvic sidewall lymphadenopathy. Reproductive: Increasing fullness about the vaginal introitus at the pelvic floor with masslike appearance suggested in this location best seen on image 118 of series 2 and image 73 of series 4 this is of uncertain significance but suspicious certainly more pronounced than in December 21, 2018 and more pronounced than on the prior exam Other: No peritoneal nodularity or ascites. Musculoskeletal: No acute bone process. No destructive bone finding. Spinal degenerative changes. IMPRESSION: 1. More masslike appearance of the vaginal introitus and lower vagina. The possibility of disease in this area is considered, this is likely amenable to direct clinical inspection for signs of mass. 2. Fluid density about the colon slightly increasing over time. The possibility disease in this area is not excluded as there is a lack of significant colonic inflammation in this location. Focused ultrasound and aspiration could be performed though findings about the vagina are more suspicious particularly given the report of close vaginal margins and the irregular appearance on CT that is definitely changed since the baseline CT evaluation of 2018-12-21 3. No findings to suggest metastatic disease to the chest. 4. Engorgement of central pulmonary vasculature slightly greater than 3 cm, raising the question of pulmonary arterial hypertension. 5. Stable biliary and pancreatic ductal distension. 6. Aortic atherosclerosis. These results will be called to the ordering clinician or representative by the Radiologist Assistant, and communication documented  in the PACS or [employer]. Aortic Atherosclerosis (ICD10-I70.0). Electronically Signed   By: Donzetta Kohut M.D.   On: 06/25/2020 15:42    CT CHEST ABDOMEN PELVIS W CONTRAST  Result Date: 06/25/2020 CLINICAL DATA:  History of rectal cancer and vaginal bleeding with elevated CEA EXAM: CT CHEST, ABDOMEN, AND PELVIS WITH CONTRAST TECHNIQUE: Multidetector CT imaging of the chest, abdomen and pelvis was performed following the standard protocol during bolus administration of intravenous contrast. CONTRAST:  25mL OMNIPAQUE IOHEXOL 300 MG/ML  SOLN COMPARISON:  Feb 25, 2020 FINDINGS: CT CHEST FINDINGS Cardiovascular: RIGHT-sided Port-A-Cath terminates in the RIGHT atrium as before. Heart size is stable without pericardial effusion. Engorgement of central pulmonary vasculature slightly greater than 3 cm. Mediastinum/Nodes: Thoracic inlet structures are normal. No axillary lymphadenopathy. No mediastinal lymphadenopathy. No hilar lymphadenopathy. Esophagus grossly normal. Lungs/Pleura: No suspicious pulmonary mass or nodule. Airways are patent. Tiny nodule along the fissure in the RIGHT lower lobe is stable at approximately 2-3 mm. The signs of lingular and RIGHT middle lobe scarring. Musculoskeletal: See below for full musculoskeletal detail. Spinal degenerative changes. No acute process involving the bony thorax. CT ABDOMEN PELVIS FINDINGS Hepatobiliary: No focal, suspicious hepatic lesion. Post cholecystectomy with less biliary duct distension that was evident on the prior study. Tiny hypodensity in the  RIGHT hepatic lobe is stable. The portal vein is patent. Pancreas: Pancreas without inflammation or focal lesion. Mild ductal distension unchanged. Spleen: Spleen normal in size and contour without focal lesion. Adrenals/Urinary Tract: Adrenal glands are normal. Cysts of the bilateral kidneys are unchanged. No hydronephrosis. Urinary bladder under distended. Stomach/Bowel: Signs of APR with LEFT lower quadrant  colostomy. Fluid density along the RIGHT lateral margin of the colon in the ostomy has increased slowly over time. To a lesser extent this is true along the LEFT lateral margin. There is no stranding about the colon as it passes through the abdominal wall. Greatest thickness of fluid at the level of the ostomy approximately 2 cm, previously approximately 1.4 cm. Vascular/Lymphatic: Calcified and noncalcified atheromatous plaque of the abdominal aorta. There is no gastrohepatic or hepatoduodenal ligament lymphadenopathy. No retroperitoneal or mesenteric lymphadenopathy. No pelvic sidewall lymphadenopathy. Reproductive: Increasing fullness about the vaginal introitus at the pelvic floor with masslike appearance suggested in this location best seen on image 118 of series 2 and image 73 of series 4 this is of uncertain significance but suspicious certainly more pronounced than in 12/16/18 and more pronounced than on the prior exam Other: No peritoneal nodularity or ascites. Musculoskeletal: No acute bone process. No destructive bone finding. Spinal degenerative changes. IMPRESSION: 1. More masslike appearance of the vaginal introitus and lower vagina. The possibility of disease in this area is considered, this is likely amenable to direct clinical inspection for signs of mass. 2. Fluid density about the colon slightly increasing over time. The possibility disease in this area is not excluded as there is a lack of significant colonic inflammation in this location. Focused ultrasound and aspiration could be performed though findings about the vagina are more suspicious particularly given the report of close vaginal margins and the irregular appearance on CT that is definitely changed since the baseline CT evaluation of 12/16/18 3. No findings to suggest metastatic disease to the chest. 4. Engorgement of central pulmonary vasculature slightly greater than 3 cm, raising the question of pulmonary arterial  hypertension. 5. Stable biliary and pancreatic ductal distension. 6. Aortic atherosclerosis. These results will be called to the ordering clinician or representative by the Radiologist Assistant, and communication documented in the PACS or [employer]. Aortic Atherosclerosis (ICD10-I70.0). Electronically Signed   By: Zetta Bills M.D.   On: 06/25/2020 15:42    ASSESSMENT & PLAN:  1. Rectal cancer ([place])   2. Goals of care, counseling/discussion   3. Encounter for antineoplastic chemotherapy   4. Vaginal bleeding   5. Hypokalemia   Cancer Staging Rectal cancer [facility]) Staging form: Colon and Rectum, AJCC 8th Edition - Clinical stage from 01/23/2019: Stage IIIC (cT4b, cN1a, cM0) - Signed by Earlie Server, MD on 01/23/2019 - Pathologic stage from 10/06/2019: Stage IIC (ypT4b, pN0, cM0) - Signed by Earlie Server, MD on 10/06/2019  #History of stage IIIC Rectal cancer, s/p TNT, followed by 09/17/19 APR/posterior vaginectomy/TAH/BSO/VY-flap pT4b pN0 with close vaginal margin 0.2 mm.  Uterus and ovaries negative for malignancy. She has now developed recurrent disease. I have discussed with [name]  Dr. Hester Mates and the mass is not resectable. Patient has also had colonoscopy by Dr. Vicente Males yesterday. Normal examination. Discussed with patient about systemic chemotherapy with FOLFIRI. I will hold bevacizumab due to intermittent vaginal bleeding.  The goal of treatment which is to palliate disease, disease related symptoms, improve quality of life and hopefully prolong life was highlighted in our discussion.  chemotherapy education was  provided.  We had discussed the composition of chemotherapy regimen, length of chemo cycle, duration of treatment and the time to assess response to treatment.    I explained to the patient the risks and benefits of chemotherapy including all but not limited to hair loss, mouth sore, nausea, vomiting, diarrhea, low blood counts, diarrhea, abdominal cramps bleeding, neuropathy and  risk of life threatening infection and even death, secondary malignancy etc.  . I discussed with her about instructions of antidiarrhea and antiemetics Patient voices understanding and willing to proceed chemotherapy. Patient's daughter also called and was put on speaker and she is able to participate the discussion and ask questions. All questions answered to both patient and daughters satisfaction.Marland Kitchen Antiemetics-Zofran and Compazine; EMLA cream sent to pharmacy #Vaginal spotting due to malignancy. Hemoglobin is stable.  If she continues to have vaginal bleeding, will refer to radiation oncology for evaluation of possible additional palliative radiation. Supportive care measures are necessary for patient well-being and will be provided as necessary. We spent sufficient time to discuss many aspect of care, questions were answered to patient's satisfaction.  #History of acute pulmonary embolism, acute bilateral proximal DVT, unprovoked Continue Eliquis 2.5mg  BID #Hypokalemia, patient will receive 20 mEq potassium chloride IV x1 along with her chemotherapy today. Follow-up 1 week for evaluation of treatment tolerability and 2 weeks for evaluation prior to next cycle of treatment.  Earlie Server, MD, PhD

## 2020-07-19 ENCOUNTER — Other Ambulatory Visit: Payer: Self-pay

## 2020-07-19 ENCOUNTER — Encounter: Payer: Self-pay | Admitting: Oncology

## 2020-07-19 ENCOUNTER — Ambulatory Visit
Admission: RE | Admit: 2020-07-19 | Discharge: 2020-07-19 | Disposition: A | Discharge status: Home / Self Care | Payer: Medicare Other | Source: Ambulatory Visit | Attending: Radiation Oncology | Admitting: Radiation Oncology

## 2020-07-19 ENCOUNTER — Inpatient Hospital Stay: Payer: Medicare Other

## 2020-07-19 ENCOUNTER — Encounter: Payer: Self-pay | Admitting: Radiation Oncology

## 2020-07-19 VITALS — BP 115/76 | HR 84 | Temp 96.9°F | Wt 167.0 lb

## 2020-07-19 VITALS — BP 108/70 | HR 78 | Resp 20

## 2020-07-19 DIAGNOSIS — C20 Malignant neoplasm of rectum: Secondary | ICD-10-CM

## 2020-07-19 DIAGNOSIS — Z510 Encounter for antineoplastic radiation therapy: Secondary | ICD-10-CM | POA: Insufficient documentation

## 2020-07-19 DIAGNOSIS — Z5111 Encounter for antineoplastic chemotherapy: Secondary | ICD-10-CM | POA: Diagnosis not present

## 2020-07-19 MED ORDER — SODIUM CHLORIDE 0.9% FLUSH
10.0000 mL | INTRAVENOUS | Status: DC | PRN
  Administered 2020-07-19: 10 mL
  Filled 2020-07-19: qty 10

## 2020-07-19 MED ORDER — HEPARIN SOD (PORK) LOCK FLUSH 100 UNIT/ML IV SOLN
500.0000 [IU] | Freq: Once | INTRAVENOUS | Status: AC | PRN
  Administered 2020-07-19: 500 [IU]
  Filled 2020-07-19: qty 5

## 2020-07-19 NOTE — Progress Notes (Signed)
Radiation Oncology Follow up Note  Name: Jacqueline Yoder   Date:   07/19/2020 MRN:  [id number] DOB: 1946/03/15    This 74 y.o. female presents to the clinic today for 1-1/2-year follow-up status post neoadjuvant chemoradiation therapy for stage IIIc (T4b N1 aM0 adenocarcinoma the rectum now with recurrence.  REFERRING PROVIDER: Kirk Ruths, MD  HPI: Patient is a 74 year old female treated at over a year and a half ago with neoadjuvant chemoradiation then underwent resection APR TAH/BSO with posterior vaginectomy and fistula takedown with ureteral stent placed and VY flap closure at [facility].  She had moderate differentiated adenocarcinoma with moderate treatment effect.  Tumor invaded through the full-thickness of the rectal wall and involved the posterior vagina.  She recently is presented with vaginal bleeding and biopsy of the vaginal introitus showed involvement of colorectal adenocarcinoma.  She now has biopsy positive recurrence.  Case was discussed with [name] and she is not thought to be a surgical candidate.  She has had a normal colonoscopy last week.  She is currently on FOLFIRI.  She is having no abdominal or pelvic pain.  She is scheduled for a PET CT scan tomorrow.  I been asked to evaluate the patient for possible palliative radiation   COMPLICATIONS OF TREATMENT: none  FOLLOW UP COMPLIANCE: keeps appointments   PHYSICAL EXAM:  BP 115/76   Pulse 84   Temp (!) 96.9 F (36.1 C) (Tympanic)   Wt 167 lb (75.8 kg)   BMI 27.79 kg/m  Well-developed well-nourished patient in NAD. HEENT reveals PERLA, EOMI, discs not visualized.  Oral cavity is clear. No oral mucosal lesions are identified. Neck is clear without evidence of cervical or supraclavicular adenopathy. Lungs are clear to A&P. Cardiac examination is essentially unremarkable with regular rate and rhythm without murmur rub or thrill. Abdomen is benign with no organomegaly or masses noted. Motor sensory and DTR levels  are equal and symmetric in the upper and lower extremities. Cranial nerves II through XII are grossly intact. Proprioception is intact. No peripheral adenopathy or edema is identified. No motor or sensory levels are noted. Crude visual fields are within normal range.  RADIOLOGY RESULTS: CT scan reviewed PET CT scan to be reviewed tomorrow  PLAN: At this time like to review her PET CT scan if this shows a localized area of recurrence in the pelvis may offer palliative radiation therapy.  Depending on the mass size and proximity to other organs will make further determination of Pap dose.  I have explained all this to the patient we will see her back early next week once we have PET CT scan to evaluate.  Patient comprehends my recommendations and treatment planning well.  I would like to take this opportunity to thank you for allowing me to participate in the care of your patient.Noreene Filbert, MD

## 2020-07-19 NOTE — Telephone Encounter (Signed)
Omniseq results in chart

## 2020-07-20 ENCOUNTER — Telehealth: Payer: Self-pay | Admitting: Pharmacist

## 2020-07-20 ENCOUNTER — Ambulatory Visit
Admission: RE | Admit: 2020-07-20 | Discharge: 2020-07-20 | Disposition: A | Discharge status: Home / Self Care | Payer: Medicare Other | Source: Ambulatory Visit | Attending: Oncology | Admitting: Oncology

## 2020-07-20 ENCOUNTER — Other Ambulatory Visit: Payer: Self-pay

## 2020-07-20 ENCOUNTER — Other Ambulatory Visit: Payer: Self-pay | Admitting: Oncology

## 2020-07-20 DIAGNOSIS — C20 Malignant neoplasm of rectum: Secondary | ICD-10-CM

## 2020-07-20 LAB — GLUCOSE, CAPILLARY: Glucose-Capillary: 86 mg/dL (ref 70–99)

## 2020-07-20 MED ORDER — FLUDEOXYGLUCOSE F - 18 (FDG) INJECTION
8.7000 | Freq: Once | INTRAVENOUS | Status: AC | PRN
  Administered 2020-07-20: 8.75 via INTRAVENOUS

## 2020-07-20 MED ORDER — CAPECITABINE 500 MG PO TABS: 825.0000 mg/m2 | ORAL_TABLET | Freq: Two times a day (BID) | ORAL | 0 refills | Status: DC

## 2020-07-20 NOTE — Telephone Encounter (Signed)
Oral Oncology Pharmacist Encounter  Received new prescription for Xeloda (capecitabine) for the treatment of recurrent rectal cancer in conjunction with RT, planned duration until the end of RT. Patients treatment with FOLFIRI will be paused while she receives localized treatment to an area of recurrence.  CMP from 07/16/20 assessed, no relevant lab abnormalities. Prescription dose and frequency assessed.   Current medication list in Epic reviewed, no relevant DDIs with capecitabine identified.  Evaluated chart and no patient barriers to medication adherence identified.   [facility] and approval.  Oral Oncology Clinic will continue to follow for insurance authorization, copayment issues, initial counseling and start date.  Darl Pikes, PharmD, BCPS, BCOP, CPP Hematology/Oncology Clinical Pharmacist Practitioner [facility] Oral [facility] [phone]  07/20/2020 3:51 PM

## 2020-07-21 ENCOUNTER — Telehealth: Payer: Self-pay | Admitting: Pharmacy Technician

## 2020-07-21 MED ORDER — CAPECITABINE 500 MG PO TABS: 825.0000 mg/m2 | ORAL_TABLET | Freq: Two times a day (BID) | ORAL | 0 refills | Status: DC

## 2020-07-21 NOTE — Telephone Encounter (Signed)
Oral Oncology Patient Advocate Encounter  Patient previously was approved for [facility] patient assistance in 2020.  Called to see if patient was still active.  Representative stated that he could reopen her application and that a new prescription would need to be sent to [facility].    Patient can call pharmacy in 3-4 days to set up shipment of medication.  [place] Patient [place] Phone [phone] Fax [phone] 07/21/2020 4:15 PM

## 2020-07-21 NOTE — Telephone Encounter (Signed)
Oral Oncology Patient Advocate Encounter  After completing a benefits investigation, prior authorization for Xeloda is not required at this time through [facility].  Patient's copay is $43.84 (Part B copay).  [place] Patient [place] Phone [phone] Fax [phone] 07/21/2020 3:36 PM

## 2020-07-21 NOTE — Telephone Encounter (Signed)
Oral Chemotherapy Pharmacist Encounter   Patient has been on Xeloda in the past and is still approved for free medication through the [place] assistance program. Prescription redirected to [facility] pharmacy.  Darl Pikes, PharmD, BCPS, BCOP, CPP Hematology/Oncology Clinical Pharmacist [facility] [phone]  07/21/2020 4:08 PM

## 2020-07-22 ENCOUNTER — Telehealth: Payer: Self-pay

## 2020-07-22 ENCOUNTER — Institutional Professional Consult (permissible substitution): Payer: Medicare Other | Admitting: Radiation Oncology

## 2020-07-22 MED ORDER — CAPECITABINE 500 MG PO TABS: 825.0000 mg/m2 | ORAL_TABLET | Freq: Two times a day (BID) | ORAL | 0 refills | Status: DC

## 2020-07-22 NOTE — Telephone Encounter (Signed)
FMLA forms completed and faxed for patient's daughter Ethelle Lyon.

## 2020-07-23 ENCOUNTER — Inpatient Hospital Stay: Payer: Medicare Other

## 2020-07-23 ENCOUNTER — Inpatient Hospital Stay (HOSPITAL_BASED_OUTPATIENT_CLINIC_OR_DEPARTMENT_OTHER): Payer: Medicare Other | Admitting: Oncology

## 2020-07-23 ENCOUNTER — Encounter: Payer: Self-pay | Admitting: Oncology

## 2020-07-23 ENCOUNTER — Other Ambulatory Visit: Payer: Self-pay

## 2020-07-23 VITALS — BP 121/77 | HR 76 | Temp 97.0°F | Resp 16 | Wt 162.7 lb

## 2020-07-23 DIAGNOSIS — C20 Malignant neoplasm of rectum: Secondary | ICD-10-CM

## 2020-07-23 DIAGNOSIS — Z23 Encounter for immunization: Secondary | ICD-10-CM

## 2020-07-23 DIAGNOSIS — Z5111 Encounter for antineoplastic chemotherapy: Secondary | ICD-10-CM | POA: Diagnosis not present

## 2020-07-23 LAB — COMPREHENSIVE METABOLIC PANEL
ALT: 22 U/L (ref 0–44)
AST: 25 U/L (ref 15–41)
Albumin: 3.7 g/dL (ref 3.5–5.0)
Alkaline Phosphatase: 45 U/L (ref 38–126)
Anion gap: 9 (ref 5–15)
BUN: 11 mg/dL (ref 8–23)
CO2: 29 mmol/L (ref 22–32)
Calcium: 8.8 mg/dL — ABNORMAL LOW (ref 8.9–10.3)
Chloride: 99 mmol/L (ref 98–111)
Creatinine, Ser: 0.8 mg/dL (ref 0.44–1.00)
GFR, Estimated: >60 mL/min (ref >60)
Glucose, Bld: 112 mg/dL — ABNORMAL HIGH (ref 70–99)
Potassium: 3.5 mmol/L (ref 3.5–5.1)
Sodium: 137 mmol/L (ref 135–145)
Total Bilirubin: 0.6 mg/dL (ref 0.3–1.2)
Total Protein: 6.8 g/dL (ref 6.5–8.1)

## 2020-07-23 LAB — CBC WITH DIFFERENTIAL/PLATELET
Abs Immature Granulocytes: 0.01 10*3/uL (ref 0.00–0.07)
Basophils Absolute: 0 10*3/uL (ref 0.0–0.1)
Basophils Relative: 0 %
Eosinophils Absolute: 0.1 10*3/uL (ref 0.0–0.5)
Eosinophils Relative: 3 %
HCT: 33.4 % — ABNORMAL LOW (ref 36.0–46.0)
Hemoglobin: 11.3 g/dL — ABNORMAL LOW (ref 12.0–15.0)
Immature Granulocytes: 0 %
Lymphocytes Relative: 39 %
Lymphs Abs: 1.2 10*3/uL (ref 0.7–4.0)
MCH: 30.1 pg (ref 26.0–34.0)
MCHC: 33.8 g/dL (ref 30.0–36.0)
MCV: 89.1 fL (ref 80.0–100.0)
Monocytes Absolute: 0.1 10*3/uL (ref 0.1–1.0)
Monocytes Relative: 4 %
Neutro Abs: 1.7 10*3/uL (ref 1.7–7.7)
Neutrophils Relative %: 54 %
Platelets: 205 10*3/uL (ref 150–400)
RBC: 3.75 MIL/uL — ABNORMAL LOW (ref 3.87–5.11)
RDW: 12.6 % (ref 11.5–15.5)
WBC: 3.2 10*3/uL — ABNORMAL LOW (ref 4.0–10.5)
nRBC: 0 % (ref 0.0–0.2)

## 2020-07-23 MED ORDER — SODIUM CHLORIDE 0.9% FLUSH
10.0000 mL | Freq: Once | INTRAVENOUS | Status: AC
  Administered 2020-07-23: 10 mL via INTRAVENOUS
  Filled 2020-07-23: qty 10

## 2020-07-23 MED ORDER — INFLUENZA VAC A&B SA ADJ QUAD 0.5 ML IM PRSY
0.5000 mL | PREFILLED_SYRINGE | Freq: Once | INTRAMUSCULAR | Status: AC
  Administered 2020-07-23: 0.5 mL via INTRAMUSCULAR
  Filled 2020-07-23: qty 0.5

## 2020-07-23 MED ORDER — HEPARIN SOD (PORK) LOCK FLUSH 100 UNIT/ML IV SOLN
INTRAVENOUS | Status: AC
  Filled 2020-07-23: qty 5

## 2020-07-23 MED ORDER — HEPARIN SOD (PORK) LOCK FLUSH 100 UNIT/ML IV SOLN
500.0000 [IU] | Freq: Once | INTRAVENOUS | Status: AC
  Administered 2020-07-23: 500 [IU] via INTRAVENOUS
  Filled 2020-07-23: qty 5

## 2020-07-23 NOTE — Progress Notes (Signed)
Hematology/Oncology progress note [facility] [phone] [phone]   Patient Care Team: Kirk Ruths, MD as PCP - General (Internal Medicine) Clent Jacks, RN as Registered Nurse  REFERRING PROVIDER: Dr.Secord CHIEF COMPLAINTS/REASON FOR VISIT:  Follow up for rectal cancer  HISTORY OF PRESENTING ILLNESS:  Jacqueline Yoder is a  74 y.o.  female with PMH listed below who was referred to me for evaluation of endometrial cancer.   Patient initially presented with complaints of postmenopausal bleeding on 08/16/2018.  History of was menopausal vaginal bleeding in 2016 which resulted in cervical polypectomy.  Pathology 02/04/2015 showed cervical polyp, consistent with benign endometrial polyp.  [name] after polypectomy due to anxiety associated with pelvic exams.  pelvic exam on 08/16/2018 reviewed cervical abnormality and from enlarged uterus. Seen by Dr. Marcelline Mates on 10/29/2018.  Endometrial biopsy and a Pap smear was performed. 10/29/2018 Pap smear showed adenocarcinoma, favor endometrial origin. 10/29/2018 endometrial biopsy showed endometrioid carcinoma, FIGO grade 1.  10/29/2018- TA & TV Ultrasound revealed: Anteverted uterus measuring 8.7 x 5.6 x 6.4 cm without evidence of focal masses.  The endometrium measuring 24.1 mm (thickened) and heterogeneous.  Right and left ovaries not visualized.  No adnexal masses identified.  No free fluid in cul-de-sac.  Patient was seen by Dr. Theora Gianotti in clinic on 11/13/2018.  Cervical exam reveals 2 cm exophytic irregular mass consistent with malignancy.   11/19/2018 CT chest abdomen pelvis with contrast showed thickened endometrium with some irregularity compatible with the provided diagnosis of endometrial malignancy.  There is a mildly prominent left inguinal node 1.4 cm.  Patient was seen by Dr. Fransisca Connors on 11/20/2018 and left groin lymph node biopsy was recommended.  11/26/2018 patient underwent  left inguinal lymph node biopsy. Pathology showed metastatic adenocarcinoma consistent with colorectal origin.  CDX 2+.  Case was discussed on tumor board.  Recommend colonoscopy for further evaluation.  Patient reports significant weight loss 30 pounds over the last year.  Chronic vaginal spotting. Change of bowel habits the past few months.  More constipated.  Family history positive for brother who has colon cancer prostate cancer.  patient has underwent colonoscopy on 12/03/2018 which reviewed a nonobstructing large mass in the rectum.  Also chronic fistula.  Mass was not circumferential.  This was biopsied with a cold forceps for histology.  Pathology came back hyperplastic polyp negative for dysplasia and malignancy. [name], patient underwent flex sigmoidoscopy on 12/06/2018 with rebiopsy of the rectal mass. This time biopsy results came back positive for invasive colorectal adenocarcinoma, moderately differentiated. Immunotherapy for nearly mismatch repair protein (MMR ) was performed.  There is no loss of MMR expression.  low probability of MSI high.   # Seen by [name] for evaluation of resectability for rectal cancer. In addition, she also had a second opinion with [facility] where her endometrial biopsy pathology was changed to  adenocarcinoma, consistent with colorectal primary.   [name]. She has home health that has been assisting with ostomy care  Patient was also evaluated by [facility] oncology.  Recommendation is to proceed with TNT with concurrent chemoradiation followed by neoadjuvant chemotherapy followed by surgical resection. Patient prefers to have treatment done locally with [facility].   # Oncology Treatment:  02/03/2019- 03/19/2019  concurrent Xeloda and radiation.  Xeloda dose 855m /m2 BID - rounded to 16555mBID- on days of radiation. 04/09/2019, started on FOLFOX with bolus 5-FU omitted.  07/16/2019  finished  8 cycles of FOLFOX.  # Patient has underwent 09/17/19 APR/posterior vaginectomy/TAH/BSO/VY-flap pT4b pN0 with close vaginal margin 0.2 mm.  Uterus and ovaries negative for malignancy. Patient reports bilateral lower extremity numbness and tingling, intermittent, left worse than right. She has lost a lot of weight since her APR surgery.   Surveillance plan Recommend history and physical/tumor marker monitoring every 3 to 6 months for the first 2 years after surgery and then every 6 months for a total of 5 years. Chest abdomen pelvis every 6 to 12 months for total 5 years. Colonoscopy 1 year after surgery, if no advanced adenoma, repeat in 3 years and then 5 years.  #Family history with half brother having's history of colon cancer prostate cancer.  Personal history of colorectal cancer.  Patient has not decided if she wants genetic testing.   # vaginal introitus mass biopsied. Pathology is consistent with metastatic colorectal adenocarcinoma  # history of PE( 01/13/2020)  in the bilateral lower extremity DVT (01/13/2020).   She finishes 6 months of anticoagulation with Eliquis 5 mg twice daily. Now switched to Eliquis 2.5 mg twice daily..  # She has now developed recurrent disease. I have discussed with [name]  Dr. Hester Mates and the mass is not resectable. Patient has also had colonoscopy by Dr. Vicente Males yesterday. Normal examination. # 07/16/2020 cycle 1 FOLFIRI   INTERVAL HISTORY Jacqueline Yoder is a 74 y.o. female who has above history reviewed by me presents for follow-up of rectal cancer. Patient has no new complaints. #intermittent vaginal spotting has improved.  Patient is taking Eliquis 2.5 mg twice daily S/p second line treatment cycle 1 FOLFIRI, tolerated well Denies any nausea, vomiting, diarrhea, abdominal pain.  She was evaluated by Dr.Chrystal and was deemed candidate for further radiation.  07/20/2020 PET scan was done for further evaluation, images are consistent with  local recurrence, no distant metastasis.   Review of Systems  Constitutional: Negative for appetite change, chills, fatigue, fever and unexpected weight change.  HENT:   Negative for hearing loss and voice change.   Eyes: Negative for eye problems.  Respiratory: Negative for chest tightness and cough.   Cardiovascular: Negative for chest pain.  Gastrointestinal: Negative for abdominal distention, abdominal pain, blood in stool, constipation, diarrhea and nausea.  Endocrine: Negative for hot flashes.  Genitourinary: Negative for difficulty urinating and frequency.   Musculoskeletal: Negative for arthralgias.  Skin: Negative for itching and rash.  Neurological: Negative for extremity weakness and numbness.  Hematological: Negative for adenopathy.  Psychiatric/Behavioral: Negative for confusion.    MEDICAL HISTORY:  Past Medical History:  Diagnosis Date  . Arthritis   . GERD (gastroesophageal reflux disease)   . Hypercholesteremia   . Hypertension   . Hypertension   . Lower extremity edema   . Rectal cancer ([place]) 12/2018  . Urinary incontinence     SURGICAL HISTORY: Past Surgical History:  Procedure Laterality Date  . CHOLECYSTECTOMY  1971  . COLONOSCOPY WITH PROPOFOL N/A 12/03/2018   Procedure: COLONOSCOPY WITH PROPOFOL;  Surgeon: Lucilla Lame, MD;  Location: [facility] ENDOSCOPY;  Service: Endoscopy;  Laterality: N/A;  . COLONOSCOPY WITH PROPOFOL N/A 07/15/2020   Procedure: COLONOSCOPY WITH PROPOFOL;  Surgeon: [name], MD;  Location: [facility] ENDOSCOPY;  Service: Gastroenterology;  Laterality: N/A;  . FLEXIBLE SIGMOIDOSCOPY N/A 12/06/2018   Procedure: FLEXIBLE SIGMOIDOSCOPY;  Surgeon: [name], MD;  Location: [facility] ENDOSCOPY;  Service: Endoscopy;  Laterality: N/A;  . LAPAROSCOPIC COLOSTOMY  01/06/2019  . PORTACATH PLACEMENT N/A 04/03/2019   Procedure:  INSERTION PORT-A-CATH;  Surgeon: Jules Husbands, MD;  Location: [facility] ORS;  Service: General;  Laterality: N/A;    SOCIAL  HISTORY: Social History   Socioeconomic History  . Marital status: Married    Spouse name: Not on file  . Number of children: Not on file  . Years of education: Not on file  . Highest education level: Not on file  Occupational History  . Not on file  Tobacco Use  . Smoking status: Former Smoker    Quit date: 12/02/1977    Years since quitting: 42.6  . Smokeless tobacco: Never Used  Vaping Use  . Vaping Use: Never used  Substance and Sexual Activity  . Alcohol use: Never  . Drug use: Never  . Sexual activity: Not Currently    Birth control/protection: None  Other Topics Concern  . Not on file  Social History Narrative   Lives with daughter   Social Determinants of Health   Financial Resource Strain:   . Difficulty of Paying Living Expenses: Not on file  Food Insecurity:   . Worried About Charity fundraiser in the Last Year: Not on file  . Ran Out of Food in the Last Year: Not on file  Transportation Needs:   . Lack of Transportation (Medical): Not on file  . Lack of Transportation (Non-Medical): Not on file  Physical Activity:   . Days of Exercise per Week: Not on file  . Minutes of Exercise per Session: Not on file  Stress:   . Feeling of Stress : Not on file  Social Connections:   . Frequency of Communication with Friends and Family: Not on file  . Frequency of Social Gatherings with Friends and Family: Not on file  . Attends Religious Services: Not on file  . Active Member of Clubs or Organizations: Not on file  . Attends Archivist Meetings: Not on file  . Marital Status: Not on file  Intimate Partner Violence:   . Fear of Current or Ex-Partner: Not on file  . Emotionally Abused: Not on file  . Physically Abused: Not on file  . Sexually Abused: Not on file    FAMILY HISTORY: Family History  Problem Relation Age of Onset  . Colon cancer Brother   . Prostate cancer Brother   . Hypertension Mother   . Stroke Mother   . Kidney failure Father    . Breast cancer Neg Hx   . Ovarian cancer Neg Hx     ALLERGIES:  is allergic to sulfamethoxazole-trimethoprim.  MEDICATIONS:  Current Outpatient Medications  Medication Sig Dispense Refill  . apixaban (ELIQUIS) 2.5 MG TABS tablet Take 1 tablet (2.5 mg total) by mouth 2 (two) times daily. 60 tablet 3  . chlorhexidine (PERIDEX) 0.12 % solution Use as directed 15 mLs in the mouth or throat 2 (two) times daily. 473 mL 3  . Cholecalciferol (VITAMIN D3) 2000 units capsule Take 2,000 Units by mouth daily.    Verneita Griffes Bark POWD Take by mouth.     . docusate sodium (COLACE) 100 MG capsule Take 100 mg by mouth 2 (two) times daily.    . fexofenadine (ALLEGRA) 180 MG tablet Take 180 mg by mouth daily.    Marland Kitchen FIBER ADULT GUMMIES PO Take 1 Dose by mouth daily.     . fluticasone (FLONASE) 50 MCG/ACT nasal spray Place 1 spray into both nostrils 2 (two) times a day.     . lidocaine-prilocaine (EMLA) cream Apply 1 application topically  as needed. 30 g 6  . loperamide (IMODIUM) 2 MG capsule Take 1 capsule (2 mg total) by mouth See admin instructions. With onset of loose stool, take $RemoveBef'4mg'caZgnjywrU$  followed by $RemoveBef'2mg'LyvnUvqqne$  every 2 hours,  Maximum: 16 mg/day 120 capsule 1  . Multiple Vitamins-Minerals (ONE-A-DAY WOMENS 50 PLUS PO) Take 1 tablet by mouth daily.     . ondansetron (ZOFRAN) 8 MG tablet Take 1 tablet (8 mg total) by mouth every 8 (eight) hours as needed for nausea, vomiting or refractory nausea / vomiting. 90 tablet 1  . prochlorperazine (COMPAZINE) 10 MG tablet Take 1 tablet (10 mg total) by mouth every 6 (six) hours as needed (NAUSEA). 30 tablet 1  . simvastatin (ZOCOR) 40 MG tablet Take 40 mg by mouth at bedtime.     . triamterene-hydrochlorothiazide (DYAZIDE) 37.5-25 MG capsule Take 1 capsule by mouth daily.     . capecitabine (XELODA) 500 MG tablet Take 3 tablets (1,500 mg total) by mouth 2 (two) times daily after a meal. Take Monday-Friday, only on days of radiation. (Patient not taking: Reported on 07/23/2020) 150  tablet 0  . diclofenac sodium (VOLTAREN) 1 % GEL Apply 2 g topically 4 (four) times daily as needed (joint pain).  (Patient not taking: Reported on 07/23/2020)  11  . gabapentin (NEURONTIN) 100 MG capsule Take 1 capsule (100 mg total) by mouth at bedtime. (Patient not taking: Reported on 06/25/2020) 90 capsule 0  . HYDROcodone-acetaminophen (NORCO/VICODIN) 5-325 MG tablet Take 1 tablet by mouth every 4 (four) hours as needed for moderate pain. (Patient not taking: Reported on 06/25/2020) 8 tablet 0  . ipratropium (ATROVENT) 0.03 % nasal spray  (Patient not taking: Reported on 06/25/2020)    . ketorolac (ACULAR) 0.4 % SOLN Place 1 drop into both eyes daily.  (Patient not taking: Reported on 06/25/2020)    . Lutein 40 MG CAPS Take 40 mg by mouth daily.  (Patient not taking: Reported on 06/25/2020)    . Lutein-Bilberry (BILBERRY PLUS LUTEIN) 02-999 MCG-MG CAPS Take 1 capsule by mouth daily.  (Patient not taking: Reported on 06/25/2020)    . magic mouthwash w/lidocaine SOLN Take 5 mLs by mouth 4 (four) times daily as needed for mouth pain. Sig: Swish/Swallow 5-10 ml four times a day as needed (Patient not taking: Reported on 06/25/2020) 480 mL 3  . Ospemifene (OSPHENA) 60 MG TABS Take 1 tablet by mouth daily. (Patient not taking: Reported on 06/25/2020) 30 tablet 2  . pantoprazole (PROTONIX) 40 MG tablet Take 1 tablet (40 mg total) by mouth as needed. (Patient not taking: Reported on 06/25/2020) 30 tablet 0  . potassium chloride SA (K-DUR) 20 MEQ tablet Take 20 mEq by mouth daily. (Patient not taking: Reported on 07/23/2020)    . prednisoLONE acetate (PRED FORTE) 1 % ophthalmic suspension Place 1 drop into both eyes 2 (two) times a day.  (Patient not taking: Reported on 07/16/2020)  1   No current facility-administered medications for this visit.     PHYSICAL EXAMINATION: ECOG PERFORMANCE STATUS: 1 - Symptomatic but completely ambulatory Vitals:   07/23/20 1402  BP: 121/77  Pulse: 76  Resp: 16  Temp: (!) 97  F (36.1 C)   Filed Weights   07/23/20 1402  Weight: 162 lb 11.2 oz (73.8 kg)    Physical Exam Constitutional:      General: She is not in acute distress. HENT:     Head: Normocephalic and atraumatic.  Eyes:     General: No scleral icterus. Cardiovascular:  Rate and Rhythm: Normal rate and regular rhythm.     Heart sounds: Normal heart sounds.  Pulmonary:     Effort: Pulmonary effort is normal. No respiratory distress.     Breath sounds: No wheezing.  Abdominal:     General: Bowel sounds are normal. There is no distension.     Palpations: Abdomen is soft.     Comments: + Colostomy bag   Musculoskeletal:        General: No deformity. Normal range of motion.     Cervical back: Normal range of motion and neck supple.  Skin:    General: Skin is warm and dry.     Findings: No erythema or rash.  Neurological:     Mental Status: She is alert and oriented to person, place, and time. Mental status is at baseline.     Cranial Nerves: No cranial nerve deficit.     Coordination: Coordination normal.  Psychiatric:        Mood and Affect: Mood normal.    CMP Latest Ref Rng & Units 07/23/2020  Glucose 70 - 99 mg/dL 112(H)  BUN 8 - 23 mg/dL 11  Creatinine 0.44 - 1.00 mg/dL 0.80  Sodium 135 - 145 mmol/L 137  Potassium 3.5 - 5.1 mmol/L 3.5  Chloride 98 - 111 mmol/L 99  CO2 22 - 32 mmol/L 29  Calcium 8.9 - 10.3 mg/dL 8.8(L)  Total Protein 6.5 - 8.1 g/dL 6.8  Total Bilirubin 0.3 - 1.2 mg/dL 0.6  Alkaline Phos 38 - 126 U/L 45  AST 15 - 41 U/L 25  ALT 0 - 44 U/L 22   CBC Latest Ref Rng & Units 07/23/2020  WBC 4.0 - 10.5 K/uL 3.2(L)  Hemoglobin 12.0 - 15.0 g/dL 11.3(L)  Hematocrit 36 - 46 % 33.4(L)  Platelets 150 - 400 K/uL 205    LABORATORY DATA:  I have reviewed the data as listed Lab Results  Component Value Date   WBC 3.2 (L) 07/23/2020   HGB 11.3 (L) 07/23/2020   HCT 33.4 (L) 07/23/2020   MCV 89.1 07/23/2020   PLT 205 07/23/2020   Recent Labs    03/19/20 0826  03/19/20 0826 06/23/20 1124 07/16/20 0802 07/23/20 1345  NA 142   < > 140 140 137  K 4.1   < > 3.7 3.2* 3.5  CL 102   < > 100 101 99  CO2 31   < > $R'30 29 29  'hF$ GLUCOSE 95   < > 114* 114* 112*  BUN 12   < > $R'10 9 11  'lR$ CREATININE 1.01*   < > 0.97 0.87 0.80  CALCIUM 9.9   < > 9.1 9.2 8.8*  GFRNONAA 56*   < > 58* >60 >60  GFRAA >60  --  >60 >60  --   PROT 7.5   < > 7.0 6.9 6.8  ALBUMIN 4.4   < > 4.0 3.6 3.7  AST 25   < > $R'25 24 25  'cs$ ALT 16   < > $R'15 14 22  'qA$ ALKPHOS 37*   < > 41 41 45  BILITOT 0.8   < > 0.4 0.5 0.6   < > = values in this interval not displayed.   Iron/TIBC/Ferritin/ %Sat    Component Value Date/Time   IRON 40 06/23/2020 1124   TIBC 246 (L) 06/23/2020 1124   FERRITIN 338 (H) 06/23/2020 1124   IRONPCTSAT 16 06/23/2020 1124     RADIOGRAPHIC STUDIES: I have personally reviewed the radiological  images as listed and agreed with the findings in the report. CT CHEST ABDOMEN PELVIS W CONTRAST  Result Date: 06/25/2020 CLINICAL DATA:  History of rectal cancer and vaginal bleeding with elevated CEA EXAM: CT CHEST, ABDOMEN, AND PELVIS WITH CONTRAST TECHNIQUE: Multidetector CT imaging of the chest, abdomen and pelvis was performed following the standard protocol during bolus administration of intravenous contrast. CONTRAST:  29mL OMNIPAQUE IOHEXOL 300 MG/ML  SOLN COMPARISON:  Feb 25, 2020 FINDINGS: CT CHEST FINDINGS Cardiovascular: RIGHT-sided Port-A-Cath terminates in the RIGHT atrium as before. Heart size is stable without pericardial effusion. Engorgement of central pulmonary vasculature slightly greater than 3 cm. Mediastinum/Nodes: Thoracic inlet structures are normal. No axillary lymphadenopathy. No mediastinal lymphadenopathy. No hilar lymphadenopathy. Esophagus grossly normal. Lungs/Pleura: No suspicious pulmonary mass or nodule. Airways are patent. Tiny nodule along the fissure in the RIGHT lower lobe is stable at approximately 2-3 mm. The signs of lingular and RIGHT middle lobe  scarring. Musculoskeletal: See below for full musculoskeletal detail. Spinal degenerative changes. No acute process involving the bony thorax. CT ABDOMEN PELVIS FINDINGS Hepatobiliary: No focal, suspicious hepatic lesion. Post cholecystectomy with less biliary duct distension that was evident on the prior study. Tiny hypodensity in the RIGHT hepatic lobe is stable. The portal vein is patent. Pancreas: Pancreas without inflammation or focal lesion. Mild ductal distension unchanged. Spleen: Spleen normal in size and contour without focal lesion. Adrenals/Urinary Tract: Adrenal glands are normal. Cysts of the bilateral kidneys are unchanged. No hydronephrosis. Urinary bladder under distended. Stomach/Bowel: Signs of APR with LEFT lower quadrant colostomy. Fluid density along the RIGHT lateral margin of the colon in the ostomy has increased slowly over time. To a lesser extent this is true along the LEFT lateral margin. There is no stranding about the colon as it passes through the abdominal wall. Greatest thickness of fluid at the level of the ostomy approximately 2 cm, previously approximately 1.4 cm. Vascular/Lymphatic: Calcified and noncalcified atheromatous plaque of the abdominal aorta. There is no gastrohepatic or hepatoduodenal ligament lymphadenopathy. No retroperitoneal or mesenteric lymphadenopathy. No pelvic sidewall lymphadenopathy. Reproductive: Increasing fullness about the vaginal introitus at the pelvic floor with masslike appearance suggested in this location best seen on image 118 of series 2 and image 73 of series 4 this is of uncertain significance but suspicious certainly more pronounced than in Dec 21, 2018 and more pronounced than on the prior exam Other: No peritoneal nodularity or ascites. Musculoskeletal: No acute bone process. No destructive bone finding. Spinal degenerative changes. IMPRESSION: 1. More masslike appearance of the vaginal introitus and lower vagina. The possibility of  disease in this area is considered, this is likely amenable to direct clinical inspection for signs of mass. 2. Fluid density about the colon slightly increasing over time. The possibility disease in this area is not excluded as there is a lack of significant colonic inflammation in this location. Focused ultrasound and aspiration could be performed though findings about the vagina are more suspicious particularly given the report of close vaginal margins and the irregular appearance on CT that is definitely changed since the baseline CT evaluation of 21-Dec-2018 3. No findings to suggest metastatic disease to the chest. 4. Engorgement of central pulmonary vasculature slightly greater than 3 cm, raising the question of pulmonary arterial hypertension. 5. Stable biliary and pancreatic ductal distension. 6. Aortic atherosclerosis. These results will be called to the ordering clinician or representative by the Radiologist Assistant, and communication documented in the PACS or [employer]. Aortic Atherosclerosis (ICD10-I70.0).  Electronically Signed   By: Donzetta Kohut M.D.   On: 06/25/2020 15:42   NM PET Image Restag (PS) Skull Base To Thigh  Result Date: 07/20/2020 CLINICAL DATA:  Subsequent treatment strategy for rectal cancer. Chemotherapy last Friday. Radiation therapy most recent 1 year ago. Vaginal biopsy 1 week ago, demonstrating involvement by colorectal adenocarcinoma. History of rectovaginal fistula. COVID vaccine x2 most recent 12/23/2019. EXAM: NUCLEAR MEDICINE PET SKULL BASE TO THIGH TECHNIQUE: 8.8 mCi F-18 FDG was injected intravenously. Full-ring PET imaging was performed from the skull base to thigh after the radiotracer. CT data was obtained and used for attenuation correction and anatomic localization. Fasting blood glucose: 86 mg/dl COMPARISON:  CTs 09/41/7919. FINDINGS: Mediastinal blood pool activity: SUV max 2.1 Liver activity: SUV max NA NECK: No areas of abnormal hypermetabolism.  Incidental CT findings: No cervical adenopathy. CHEST: No pulmonary parenchymal or thoracic nodal hypermetabolism. Incidental CT findings: Deferred to recent diagnostic CT. Right Port-A-Cath tip mid to low right atrium. Aortic and coronary artery atherosclerosis. ABDOMEN/PELVIS: No abdominopelvic nodal hypermetabolism. There is hypermetabolism at the descending colon along its peripheral course through the ostomy (status post abdominal perineal resection). Example at a S.U.V. max of 5.0 on 184/3. This is separate from the right-sided fluid described on the prior diagnostic CT, which is relatively similar at maximally 2.1 cm on 181/3. Circumferential wall thickening and hypermetabolism within the vaginal introitus. Example at a S.U.V. max of 12.3 on 236/3. Incidental CT findings: Deferred to recent diagnostic CT. Low-density bilateral renal lesions are likely cysts. Right abdominal wall hernia repair. Hysterectomy. Scattered colonic diverticula. SKELETON: No abnormal marrow activity. Incidental CT findings: Osteopenia IMPRESSION: 1. Circumferential wall thickening and hypermetabolism involving the vaginal introitus, consistent with the recent biopsy results of metastatic colorectal carcinoma. 2. Status post abdominal perineal resection with left abdominal ostomy. Hypermetabolism within the colon within the ostomy could be physiologic. This should be amenable to physical exam correlation to exclude residual/recurrent disease. 3. No other evidence of hypermetabolic metastasis. 4. Incidental findings, including: Aortic atherosclerosis (ICD10-I70.0) and emphysema (ICD10-J43.9). Electronically Signed   By: Jeronimo Greaves M.D.   On: 07/20/2020 14:15    CT CHEST ABDOMEN PELVIS W CONTRAST  Result Date: 06/25/2020 CLINICAL DATA:  History of rectal cancer and vaginal bleeding with elevated CEA EXAM: CT CHEST, ABDOMEN, AND PELVIS WITH CONTRAST TECHNIQUE: Multidetector CT imaging of the chest, abdomen and pelvis was performed  following the standard protocol during bolus administration of intravenous contrast. CONTRAST:  47mL OMNIPAQUE IOHEXOL 300 MG/ML  SOLN COMPARISON:  Feb 25, 2020 FINDINGS: CT CHEST FINDINGS Cardiovascular: RIGHT-sided Port-A-Cath terminates in the RIGHT atrium as before. Heart size is stable without pericardial effusion. Engorgement of central pulmonary vasculature slightly greater than 3 cm. Mediastinum/Nodes: Thoracic inlet structures are normal. No axillary lymphadenopathy. No mediastinal lymphadenopathy. No hilar lymphadenopathy. Esophagus grossly normal. Lungs/Pleura: No suspicious pulmonary mass or nodule. Airways are patent. Tiny nodule along the fissure in the RIGHT lower lobe is stable at approximately 2-3 mm. The signs of lingular and RIGHT middle lobe scarring. Musculoskeletal: See below for full musculoskeletal detail. Spinal degenerative changes. No acute process involving the bony thorax. CT ABDOMEN PELVIS FINDINGS Hepatobiliary: No focal, suspicious hepatic lesion. Post cholecystectomy with less biliary duct distension that was evident on the prior study. Tiny hypodensity in the RIGHT hepatic lobe is stable. The portal vein is patent. Pancreas: Pancreas without inflammation or focal lesion. Mild ductal distension unchanged. Spleen: Spleen normal in size and contour without focal lesion. Adrenals/Urinary Tract: Adrenal glands  are normal. Cysts of the bilateral kidneys are unchanged. No hydronephrosis. Urinary bladder under distended. Stomach/Bowel: Signs of APR with LEFT lower quadrant colostomy. Fluid density along the RIGHT lateral margin of the colon in the ostomy has increased slowly over time. To a lesser extent this is true along the LEFT lateral margin. There is no stranding about the colon as it passes through the abdominal wall. Greatest thickness of fluid at the level of the ostomy approximately 2 cm, previously approximately 1.4 cm. Vascular/Lymphatic: Calcified and noncalcified atheromatous  plaque of the abdominal aorta. There is no gastrohepatic or hepatoduodenal ligament lymphadenopathy. No retroperitoneal or mesenteric lymphadenopathy. No pelvic sidewall lymphadenopathy. Reproductive: Increasing fullness about the vaginal introitus at the pelvic floor with masslike appearance suggested in this location best seen on image 118 of series 2 and image 73 of series 4 this is of uncertain significance but suspicious certainly more pronounced than in 12-11-18 and more pronounced than on the prior exam Other: No peritoneal nodularity or ascites. Musculoskeletal: No acute bone process. No destructive bone finding. Spinal degenerative changes. IMPRESSION: 1. More masslike appearance of the vaginal introitus and lower vagina. The possibility of disease in this area is considered, this is likely amenable to direct clinical inspection for signs of mass. 2. Fluid density about the colon slightly increasing over time. The possibility disease in this area is not excluded as there is a lack of significant colonic inflammation in this location. Focused ultrasound and aspiration could be performed though findings about the vagina are more suspicious particularly given the report of close vaginal margins and the irregular appearance on CT that is definitely changed since the baseline CT evaluation of 12/11/2018 3. No findings to suggest metastatic disease to the chest. 4. Engorgement of central pulmonary vasculature slightly greater than 3 cm, raising the question of pulmonary arterial hypertension. 5. Stable biliary and pancreatic ductal distension. 6. Aortic atherosclerosis. These results will be called to the ordering clinician or representative by the Radiologist Assistant, and communication documented in the PACS or [employer]. Aortic Atherosclerosis (ICD10-I70.0). Electronically Signed   By: Donzetta Kohut M.D.   On: 06/25/2020 15:42   NM PET Image Restag (PS) Skull Base To Thigh  Result  Date: 07/20/2020 CLINICAL DATA:  Subsequent treatment strategy for rectal cancer. Chemotherapy last Friday. Radiation therapy most recent 1 year ago. Vaginal biopsy 1 week ago, demonstrating involvement by colorectal adenocarcinoma. History of rectovaginal fistula. COVID vaccine x2 most recent 12/23/2019. EXAM: NUCLEAR MEDICINE PET SKULL BASE TO THIGH TECHNIQUE: 8.8 mCi F-18 FDG was injected intravenously. Full-ring PET imaging was performed from the skull base to thigh after the radiotracer. CT data was obtained and used for attenuation correction and anatomic localization. Fasting blood glucose: 86 mg/dl COMPARISON:  CTs 08/38/7828. FINDINGS: Mediastinal blood pool activity: SUV max 2.1 Liver activity: SUV max NA NECK: No areas of abnormal hypermetabolism. Incidental CT findings: No cervical adenopathy. CHEST: No pulmonary parenchymal or thoracic nodal hypermetabolism. Incidental CT findings: Deferred to recent diagnostic CT. Right Port-A-Cath tip mid to low right atrium. Aortic and coronary artery atherosclerosis. ABDOMEN/PELVIS: No abdominopelvic nodal hypermetabolism. There is hypermetabolism at the descending colon along its peripheral course through the ostomy (status post abdominal perineal resection). Example at a S.U.V. max of 5.0 on 184/3. This is separate from the right-sided fluid described on the prior diagnostic CT, which is relatively similar at maximally 2.1 cm on 181/3. Circumferential wall thickening and hypermetabolism within the vaginal introitus. Example at a S.U.V.  max of 12.3 on 236/3. Incidental CT findings: Deferred to recent diagnostic CT. Low-density bilateral renal lesions are likely cysts. Right abdominal wall hernia repair. Hysterectomy. Scattered colonic diverticula. SKELETON: No abnormal marrow activity. Incidental CT findings: Osteopenia IMPRESSION: 1. Circumferential wall thickening and hypermetabolism involving the vaginal introitus, consistent with the recent biopsy results of  metastatic colorectal carcinoma. 2. Status post abdominal perineal resection with left abdominal ostomy. Hypermetabolism within the colon within the ostomy could be physiologic. This should be amenable to physical exam correlation to exclude residual/recurrent disease. 3. No other evidence of hypermetabolic metastasis. 4. Incidental findings, including: Aortic atherosclerosis (ICD10-I70.0) and emphysema (ICD10-J43.9). Electronically Signed   By: Abigail Miyamoto M.D.   On: 07/20/2020 14:15    ASSESSMENT & PLAN:  1. Rectal cancer [facility])   Cancer Staging Rectal cancer [facility]) Staging form: Colon and Rectum, AJCC 8th Edition - Clinical stage from 01/23/2019: Stage IIIC (cT4b, cN1a, cM0) - Signed by Earlie Server, MD on 01/23/2019 - Pathologic stage from 10/06/2019: Stage IIC (ypT4b, pN0, cM0) - Signed by Earlie Server, MD on 10/06/2019  #History of stage IIIC Rectal cancer, s/p TNT, followed by 09/17/19 APR/posterior vaginectomy/TAH/BSO/VY-flap pT4b pN0 with close vaginal margin 0.2 mm.  Uterus and ovaries negative for malignancy. Developed local recurrence.  Labs are reviewed and discussed with patient. She tolerated systemic treatment.  PET showed no distant recurrence and she is able to receive additional radiation.  Plan xeloda 825mg  /m2 concurrently with radiation.  Rationale and potential side effects of Xeloda was discussed with pt and she agrees with the plan.   #Vaginal spotting due to malignancy. Hemoglobin is stable.  #History of acute pulmonary embolism, acute bilateral proximal DVT, unprovoked Continue Eliquis 2.5mg  BID Follow-up  08/02/2020  Earlie Server, MD, PhD

## 2020-07-23 NOTE — Progress Notes (Signed)
Patient denies new problems/concerns today.   °

## 2020-07-26 ENCOUNTER — Telehealth: Payer: Self-pay | Admitting: *Deleted

## 2020-07-26 ENCOUNTER — Ambulatory Visit
Admission: RE | Admit: 2020-07-26 | Discharge: 2020-07-26 | Disposition: A | Discharge status: Home / Self Care | Payer: Medicare Other | Source: Ambulatory Visit | Attending: Radiation Oncology | Admitting: Radiation Oncology

## 2020-07-26 DIAGNOSIS — C20 Malignant neoplasm of rectum: Secondary | ICD-10-CM | POA: Diagnosis not present

## 2020-07-26 DIAGNOSIS — Z510 Encounter for antineoplastic radiation therapy: Secondary | ICD-10-CM | POA: Diagnosis not present

## 2020-07-26 MED ORDER — APIXABAN 2.5 MG PO TABS: 2.5000 mg | ORAL_TABLET | Freq: Two times a day (BID) | ORAL | 0 refills | Status: DC

## 2020-07-26 NOTE — Telephone Encounter (Signed)
Oral Oncology Patient Advocate Encounter  Patient called this morning stating there was an issue with her getting Xeloda through [facility].  I called [facility] and spoke with Hassan Rowan.  There was an issue with the primary MD on the patients application.  They verified the prescription and I called Mrs. Hamblen to add her to the phone call so she could schedule shipment.  Patient is scheduled to receive Xeloda on 07/30/20.  Patient will need to call [facility] shortly after she receives her first shipment to schedule the next shipment to receive without delay.  [place] Patient [place] Phone [phone] Fax [phone] 07/26/2020 10:07 AM

## 2020-07-26 NOTE — Telephone Encounter (Signed)
Patient cannot afford her Eliquis this month being told that she is in her donut hole with [facility] and she can't afford $133. I spoke with Lavena Stanford patient care navigator and he agreed to cover the cost under the [facility] and for prescription to be sent to [facility]. Patient informed and grateful and will pick up prescription at [facility]. Given directions to [facility] also

## 2020-07-27 ENCOUNTER — Telehealth: Payer: Self-pay | Admitting: *Deleted

## 2020-07-27 NOTE — Telephone Encounter (Signed)
Patient called asking if it will be ok to get her COVID Booster Vaccine Monday which is the day she starts her radiation therapy Please advise

## 2020-07-27 NOTE — Telephone Encounter (Signed)
Patient called back and was informed per radiation therapy that there is no problem getting booster

## 2020-07-27 NOTE — Telephone Encounter (Signed)
I recommend her to get booster.

## 2020-07-27 NOTE — Telephone Encounter (Signed)
From radiation standpoint it is fine, would defer to med onc for final decision.

## 2020-07-28 NOTE — Telephone Encounter (Signed)
Returned call 07/28/20 to [facility] to verify that there were no issues with Xeloda being shipped tomorrow. Rep stated that information they needed to be verified had been and was good to ship.  07/27/20- I spoke to [facility] to verify that Dr Tasia Catchings was the correct MD for the prescription.  [place] Patient [place] Phone [phone] Fax [phone] 07/28/2020 3:30 PM

## 2020-07-29 DIAGNOSIS — C20 Malignant neoplasm of rectum: Secondary | ICD-10-CM | POA: Diagnosis not present

## 2020-07-30 ENCOUNTER — Ambulatory Visit: Payer: Medicare Other

## 2020-07-30 ENCOUNTER — Other Ambulatory Visit: Payer: Medicare Other

## 2020-07-30 ENCOUNTER — Ambulatory Visit: Payer: Medicare Other | Admitting: Oncology

## 2020-08-02 ENCOUNTER — Other Ambulatory Visit: Payer: Self-pay

## 2020-08-02 ENCOUNTER — Ambulatory Visit: Payer: Medicare Other

## 2020-08-02 ENCOUNTER — Encounter: Payer: Self-pay | Admitting: Oncology

## 2020-08-02 ENCOUNTER — Inpatient Hospital Stay: Payer: Medicare Other

## 2020-08-02 ENCOUNTER — Ambulatory Visit: Payer: Medicare Other | Attending: Internal Medicine

## 2020-08-02 ENCOUNTER — Ambulatory Visit: Admission: RE | Admit: 2020-08-02 | Payer: Medicare Other | Source: Ambulatory Visit

## 2020-08-02 ENCOUNTER — Inpatient Hospital Stay (HOSPITAL_BASED_OUTPATIENT_CLINIC_OR_DEPARTMENT_OTHER): Payer: Medicare Other | Admitting: Oncology

## 2020-08-02 VITALS — BP 105/63 | HR 74 | Temp 96.0°F | Resp 18 | Wt 165.2 lb

## 2020-08-02 DIAGNOSIS — Z5111 Encounter for antineoplastic chemotherapy: Secondary | ICD-10-CM | POA: Diagnosis not present

## 2020-08-02 DIAGNOSIS — N939 Abnormal uterine and vaginal bleeding, unspecified: Secondary | ICD-10-CM

## 2020-08-02 DIAGNOSIS — D709 Neutropenia, unspecified: Secondary | ICD-10-CM | POA: Diagnosis not present

## 2020-08-02 DIAGNOSIS — C20 Malignant neoplasm of rectum: Secondary | ICD-10-CM | POA: Diagnosis not present

## 2020-08-02 DIAGNOSIS — Z23 Encounter for immunization: Secondary | ICD-10-CM

## 2020-08-02 LAB — CBC WITH DIFFERENTIAL/PLATELET
Abs Immature Granulocytes: 0 10*3/uL (ref 0.00–0.07)
Basophils Absolute: 0 10*3/uL (ref 0.0–0.1)
Basophils Relative: 0 %
Eosinophils Absolute: 0.1 10*3/uL (ref 0.0–0.5)
Eosinophils Relative: 5 %
HCT: 36.7 % (ref 36.0–46.0)
Hemoglobin: 11.7 g/dL — ABNORMAL LOW (ref 12.0–15.0)
Immature Granulocytes: 0 %
Lymphocytes Relative: 43 %
Lymphs Abs: 1.1 10*3/uL (ref 0.7–4.0)
MCH: 29.8 pg (ref 26.0–34.0)
MCHC: 31.9 g/dL (ref 30.0–36.0)
MCV: 93.4 fL (ref 80.0–100.0)
Monocytes Absolute: 0.3 10*3/uL (ref 0.1–1.0)
Monocytes Relative: 11 %
Neutro Abs: 1 10*3/uL — ABNORMAL LOW (ref 1.7–7.7)
Neutrophils Relative %: 41 %
Platelets: 202 10*3/uL (ref 150–400)
RBC: 3.93 MIL/uL (ref 3.87–5.11)
RDW: 13.6 % (ref 11.5–15.5)
WBC: 2.5 10*3/uL — ABNORMAL LOW (ref 4.0–10.5)
nRBC: 0 % (ref 0.0–0.2)

## 2020-08-02 LAB — COMPREHENSIVE METABOLIC PANEL
ALT: 11 U/L (ref 0–44)
AST: 23 U/L (ref 15–41)
Albumin: 3.7 g/dL (ref 3.5–5.0)
Alkaline Phosphatase: 47 U/L (ref 38–126)
Anion gap: 9 (ref 5–15)
BUN: 8 mg/dL (ref 8–23)
CO2: 30 mmol/L (ref 22–32)
Calcium: 9.3 mg/dL (ref 8.9–10.3)
Chloride: 102 mmol/L (ref 98–111)
Creatinine, Ser: 0.93 mg/dL (ref 0.44–1.00)
GFR, Estimated: >60 mL/min (ref >60)
Glucose, Bld: 96 mg/dL (ref 70–99)
Potassium: 3.5 mmol/L (ref 3.5–5.1)
Sodium: 141 mmol/L (ref 135–145)
Total Bilirubin: 0.7 mg/dL (ref 0.3–1.2)
Total Protein: 6.9 g/dL (ref 6.5–8.1)

## 2020-08-02 NOTE — Progress Notes (Signed)
Pt here for follow up. No new concerns voiced.   

## 2020-08-02 NOTE — Progress Notes (Signed)
   Covid-19 Vaccination Clinic  Name:  Jacqueline Yoder    MRN: [id number] DOB: 11/30/1945  08/02/2020  Jacqueline Yoder was observed post Covid-19 immunization for 15 minutes without incident. She was provided with Vaccine Information Sheet and instruction to access the V-Safe system.   Jacqueline Yoder was instructed to call 911 with any severe reactions post vaccine: Marland Kitchen Difficulty breathing  . Swelling of face and throat  . A fast heartbeat  . A bad rash all over body  . Dizziness and weakness

## 2020-08-02 NOTE — Progress Notes (Signed)
Hematology/Oncology progress note [facility] [phone] [phone]   Patient Care Team: Kirk Ruths, MD as PCP - General (Internal Medicine) Clent Jacks, RN as Registered Nurse  REFERRING PROVIDER: Dr.Secord CHIEF COMPLAINTS/REASON FOR VISIT:  Follow up for rectal cancer  HISTORY OF PRESENTING ILLNESS:  Jacqueline Yoder is a  74 y.o.  female with PMH listed below who was referred to me for evaluation of endometrial cancer.   Patient initially presented with complaints of postmenopausal bleeding on 08/16/2018.  History of was menopausal vaginal bleeding in 2016 which resulted in cervical polypectomy.  Pathology 02/04/2015 showed cervical polyp, consistent with benign endometrial polyp.  [name] after polypectomy due to anxiety associated with pelvic exams.  pelvic exam on 08/16/2018 reviewed cervical abnormality and from enlarged uterus. Seen by Dr. Marcelline Mates on 10/29/2018.  Endometrial biopsy and a Pap smear was performed. 10/29/2018 Pap smear showed adenocarcinoma, favor endometrial origin. 10/29/2018 endometrial biopsy showed endometrioid carcinoma, FIGO grade 1.  10/29/2018- TA & TV Ultrasound revealed: Anteverted uterus measuring 8.7 x 5.6 x 6.4 cm without evidence of focal masses.  The endometrium measuring 24.1 mm (thickened) and heterogeneous.  Right and left ovaries not visualized.  No adnexal masses identified.  No free fluid in cul-de-sac.  Patient was seen by Dr. Theora Gianotti in clinic on 11/13/2018.  Cervical exam reveals 2 cm exophytic irregular mass consistent with malignancy.   11/19/2018 CT chest abdomen pelvis with contrast showed thickened endometrium with some irregularity compatible with the provided diagnosis of endometrial malignancy.  There is a mildly prominent left inguinal node 1.4 cm.  Patient was seen by Dr. Fransisca Connors on 11/20/2018 and left groin lymph node biopsy was recommended.  11/26/2018 patient underwent  left inguinal lymph node biopsy. Pathology showed metastatic adenocarcinoma consistent with colorectal origin.  CDX 2+.  Case was discussed on tumor board.  Recommend colonoscopy for further evaluation.  Patient reports significant weight loss 30 pounds over the last year.  Chronic vaginal spotting. Change of bowel habits the past few months.  More constipated.  Family history positive for brother who has colon cancer prostate cancer.  patient has underwent colonoscopy on 12/03/2018 which reviewed a nonobstructing large mass in the rectum.  Also chronic fistula.  Mass was not circumferential.  This was biopsied with a cold forceps for histology.  Pathology came back hyperplastic polyp negative for dysplasia and malignancy. [name], patient underwent flex sigmoidoscopy on 12/06/2018 with rebiopsy of the rectal mass. This time biopsy results came back positive for invasive colorectal adenocarcinoma, moderately differentiated. Immunotherapy for nearly mismatch repair protein (MMR ) was performed.  There is no loss of MMR expression.  low probability of MSI high.   # Seen by [name] for evaluation of resectability for rectal cancer. In addition, she also had a second opinion with [facility] where her endometrial biopsy pathology was changed to  adenocarcinoma, consistent with colorectal primary.   [name]. She has home health that has been assisting with ostomy care  Patient was also evaluated by [facility] oncology.  Recommendation is to proceed with TNT with concurrent chemoradiation followed by neoadjuvant chemotherapy followed by surgical resection. Patient prefers to have treatment done locally with [facility].   # Oncology Treatment:  02/03/2019- 03/19/2019  concurrent Xeloda and radiation.  Xeloda dose 855m /m2 BID - rounded to 16555mBID- on days of radiation. 04/09/2019, started on FOLFOX with bolus 5-FU omitted.  07/16/2019  finished  8 cycles of FOLFOX.  # Patient has underwent 09/17/19 APR/posterior vaginectomy/TAH/BSO/VY-flap pT4b pN0 with close vaginal margin 0.2 mm.  Uterus and ovaries negative for malignancy. Patient reports bilateral lower extremity numbness and tingling, intermittent, left worse than right. She has lost a lot of weight since her APR surgery.   Surveillance plan Recommend history and physical/tumor marker monitoring every 3 to 6 months for the first 2 years after surgery and then every 6 months for a total of 5 years. Chest abdomen pelvis every 6 to 12 months for total 5 years. Colonoscopy 1 year after surgery, if no advanced adenoma, repeat in 3 years and then 5 years.  #Family history with half brother having's history of colon cancer prostate cancer.  Personal history of colorectal cancer.  Patient has not decided if she wants genetic testing.   # vaginal introitus mass biopsied. Pathology is consistent with metastatic colorectal adenocarcinoma  # history of PE( 01/13/2020)  in the bilateral lower extremity DVT (01/13/2020).   She finishes 6 months of anticoagulation with Eliquis 5 mg twice daily. Now switched to Eliquis 2.5 mg twice daily..  # She has now developed recurrent disease. I have discussed with [name]  Dr. Hester Mates and the mass is not resectable. Patient has also had colonoscopy by Dr. Vicente Males yesterday. Normal examination. # 07/16/2020 cycle 1 FOLFIRI  # 07/20/2020 PET scan was done for further evaluation, images are consistent with local recurrence, no distant metastasis. INTERVAL HISTORY Jacqueline Yoder is a 74 y.o. female who has above history reviewed by me presents for follow-up of rectal cancer. Patient has no new complaints. Patient is taking Eliquis 2.5 mg twice daily S/p second line treatment cycle 1 FOLFIRI, tolerated well She will be starting concurrent chemoradiation today. Patient denies any nausea, vomiting, fever, chills.  Vaginal spotting has improved  slightly. Denies any nausea, vomiting, diarrhea, abdominal pain.    Review of Systems  Constitutional: Negative for appetite change, chills, fatigue, fever and unexpected weight change.  HENT:   Negative for hearing loss and voice change.   Eyes: Negative for eye problems.  Respiratory: Negative for chest tightness and cough.   Cardiovascular: Negative for chest pain.  Gastrointestinal: Negative for abdominal distention, abdominal pain, blood in stool, constipation, diarrhea and nausea.  Endocrine: Negative for hot flashes.  Genitourinary: Negative for difficulty urinating and frequency.   Musculoskeletal: Negative for arthralgias.  Skin: Negative for itching and rash.  Neurological: Negative for extremity weakness and numbness.  Hematological: Negative for adenopathy.  Psychiatric/Behavioral: Negative for confusion.    MEDICAL HISTORY:  Past Medical History:  Diagnosis Date  . Arthritis   . GERD (gastroesophageal reflux disease)   . Hypercholesteremia   . Hypertension   . Hypertension   . Lower extremity edema   . Rectal cancer ([place]) 12/2018  . Urinary incontinence     SURGICAL HISTORY: Past Surgical History:  Procedure Laterality Date  . CHOLECYSTECTOMY  1971  . COLONOSCOPY WITH PROPOFOL N/A 12/03/2018   Procedure: COLONOSCOPY WITH PROPOFOL;  Surgeon: Lucilla Lame, MD;  Location: [facility] ENDOSCOPY;  Service: Endoscopy;  Laterality: N/A;  . COLONOSCOPY WITH PROPOFOL N/A 07/15/2020   Procedure: COLONOSCOPY WITH PROPOFOL;  Surgeon: [name], MD;  Location: [facility] ENDOSCOPY;  Service: Gastroenterology;  Laterality: N/A;  . FLEXIBLE SIGMOIDOSCOPY N/A 12/06/2018   Procedure: FLEXIBLE SIGMOIDOSCOPY;  Surgeon: [name], MD;  Location: [facility] ENDOSCOPY;  Service: Endoscopy;  Laterality: N/A;  . LAPAROSCOPIC COLOSTOMY  01/06/2019  . PORTACATH PLACEMENT N/A 04/03/2019  Procedure: INSERTION PORT-A-CATH;  Surgeon: Jules Husbands, MD;  Location: [facility] ORS;  Service: General;  Laterality:  N/A;    SOCIAL HISTORY: Social History   Socioeconomic History  . Marital status: Married    Spouse name: Not on file  . Number of children: Not on file  . Years of education: Not on file  . Highest education level: Not on file  Occupational History  . Not on file  Tobacco Use  . Smoking status: Former Smoker    Quit date: 12/02/1977    Years since quitting: 42.6  . Smokeless tobacco: Never Used  Vaping Use  . Vaping Use: Never used  Substance and Sexual Activity  . Alcohol use: Never  . Drug use: Never  . Sexual activity: Not Currently    Birth control/protection: None  Other Topics Concern  . Not on file  Social History Narrative   Lives with daughter   Social Determinants of Health   Financial Resource Strain:   . Difficulty of Paying Living Expenses: Not on file  Food Insecurity:   . Worried About Charity fundraiser in the Last Year: Not on file  . Ran Out of Food in the Last Year: Not on file  Transportation Needs:   . Lack of Transportation (Medical): Not on file  . Lack of Transportation (Non-Medical): Not on file  Physical Activity:   . Days of Exercise per Week: Not on file  . Minutes of Exercise per Session: Not on file  Stress:   . Feeling of Stress : Not on file  Social Connections:   . Frequency of Communication with Friends and Family: Not on file  . Frequency of Social Gatherings with Friends and Family: Not on file  . Attends Religious Services: Not on file  . Active Member of Clubs or Organizations: Not on file  . Attends Archivist Meetings: Not on file  . Marital Status: Not on file  Intimate Partner Violence:   . Fear of Current or Ex-Partner: Not on file  . Emotionally Abused: Not on file  . Physically Abused: Not on file  . Sexually Abused: Not on file    FAMILY HISTORY: Family History  Problem Relation Age of Onset  . Colon cancer Brother   . Prostate cancer Brother   . Hypertension Mother   . Stroke Mother   . Kidney  failure Father   . Breast cancer Neg Hx   . Ovarian cancer Neg Hx     ALLERGIES:  is allergic to sulfamethoxazole-trimethoprim.  MEDICATIONS:  Current Outpatient Medications  Medication Sig Dispense Refill  . apixaban (ELIQUIS) 2.5 MG TABS tablet Take 1 tablet (2.5 mg total) by mouth 2 (two) times daily. 60 tablet 0  . capecitabine (XELODA) 500 MG tablet Take 3 tablets (1,500 mg total) by mouth 2 (two) times daily after a meal. Take Monday-Friday, only on days of radiation. 150 tablet 0  . Cholecalciferol (VITAMIN D3) 2000 units capsule Take 2,000 Units by mouth daily.    Marland Kitchen docusate sodium (COLACE) 100 MG capsule Take 100 mg by mouth 2 (two) times daily.    . fexofenadine (ALLEGRA) 180 MG tablet Take 180 mg by mouth daily.    . fluticasone (FLONASE) 50 MCG/ACT nasal spray Place 1 spray into both nostrils 2 (two) times a day.     . lidocaine-prilocaine (EMLA) cream Apply 1 application topically as needed. 30 g 6  . loperamide (IMODIUM) 2 MG capsule Take 1 capsule (2 mg  total) by mouth See admin instructions. With onset of loose stool, take $RemoveBef'4mg'YSpcGJNRaf$  followed by $RemoveBef'2mg'vSKntcXmUU$  every 2 hours,  Maximum: 16 mg/day 120 capsule 1  . Multiple Vitamins-Minerals (ONE-A-DAY WOMENS 50 PLUS PO) Take 1 tablet by mouth daily.     . ondansetron (ZOFRAN) 8 MG tablet Take 1 tablet (8 mg total) by mouth every 8 (eight) hours as needed for nausea, vomiting or refractory nausea / vomiting. 90 tablet 1  . potassium chloride SA (K-DUR) 20 MEQ tablet Take 20 mEq by mouth daily.     . prochlorperazine (COMPAZINE) 10 MG tablet Take 1 tablet (10 mg total) by mouth every 6 (six) hours as needed (NAUSEA). 30 tablet 1  . simvastatin (ZOCOR) 40 MG tablet Take 40 mg by mouth at bedtime.     . triamterene-hydrochlorothiazide (DYAZIDE) 37.5-25 MG capsule Take 1 capsule by mouth daily.     . chlorhexidine (PERIDEX) 0.12 % solution Use as directed 15 mLs in the mouth or throat 2 (two) times daily. (Patient not taking: Reported on 08/02/2020)  473 mL 3  . Cinnamon Bark POWD Take by mouth.  (Patient not taking: Reported on 08/02/2020)    . diclofenac sodium (VOLTAREN) 1 % GEL Apply 2 g topically 4 (four) times daily as needed (joint pain).  (Patient not taking: Reported on 07/23/2020)  11  . FIBER ADULT GUMMIES PO Take 1 Dose by mouth daily.  (Patient not taking: Reported on 08/02/2020)    . gabapentin (NEURONTIN) 100 MG capsule Take 1 capsule (100 mg total) by mouth at bedtime. (Patient not taking: Reported on 06/25/2020) 90 capsule 0  . HYDROcodone-acetaminophen (NORCO/VICODIN) 5-325 MG tablet Take 1 tablet by mouth every 4 (four) hours as needed for moderate pain. (Patient not taking: Reported on 06/25/2020) 8 tablet 0  . ipratropium (ATROVENT) 0.03 % nasal spray  (Patient not taking: Reported on 06/25/2020)    . ketorolac (ACULAR) 0.4 % SOLN Place 1 drop into both eyes daily.  (Patient not taking: Reported on 06/25/2020)    . Lutein 40 MG CAPS Take 40 mg by mouth daily.  (Patient not taking: Reported on 06/25/2020)    . Lutein-Bilberry (BILBERRY PLUS LUTEIN) 02-999 MCG-MG CAPS Take 1 capsule by mouth daily.  (Patient not taking: Reported on 06/25/2020)    . magic mouthwash w/lidocaine SOLN Take 5 mLs by mouth 4 (four) times daily as needed for mouth pain. Sig: Swish/Swallow 5-10 ml four times a day as needed (Patient not taking: Reported on 06/25/2020) 480 mL 3  . Ospemifene (OSPHENA) 60 MG TABS Take 1 tablet by mouth daily. (Patient not taking: Reported on 06/25/2020) 30 tablet 2  . pantoprazole (PROTONIX) 40 MG tablet Take 1 tablet (40 mg total) by mouth as needed. (Patient not taking: Reported on 06/25/2020) 30 tablet 0  . prednisoLONE acetate (PRED FORTE) 1 % ophthalmic suspension Place 1 drop into both eyes 2 (two) times a day.  (Patient not taking: Reported on 07/16/2020)  1   No current facility-administered medications for this visit.     PHYSICAL EXAMINATION: ECOG PERFORMANCE STATUS: 1 - Symptomatic but completely ambulatory Vitals:     08/02/20 0839  BP: 105/63  Pulse: 74  Resp: 18  Temp: (!) 96 F (35.6 C)   Filed Weights   08/02/20 0839  Weight: 165 lb 3.2 oz (74.9 kg)    Physical Exam Constitutional:      General: She is not in acute distress. HENT:     Head: Normocephalic and atraumatic.  Eyes:  General: No scleral icterus. Cardiovascular:     Rate and Rhythm: Normal rate and regular rhythm.     Heart sounds: Normal heart sounds.  Pulmonary:     Effort: Pulmonary effort is normal. No respiratory distress.     Breath sounds: No wheezing.  Abdominal:     General: Bowel sounds are normal. There is no distension.     Palpations: Abdomen is soft.     Comments: + Colostomy bag   Musculoskeletal:        General: No deformity. Normal range of motion.     Cervical back: Normal range of motion and neck supple.  Skin:    General: Skin is warm and dry.     Findings: No erythema or rash.  Neurological:     Mental Status: She is alert and oriented to person, place, and time. Mental status is at baseline.     Cranial Nerves: No cranial nerve deficit.     Coordination: Coordination normal.  Psychiatric:        Mood and Affect: Mood normal.    CMP Latest Ref Rng & Units 08/02/2020  Glucose 70 - 99 mg/dL 96  BUN 8 - 23 mg/dL 8  Creatinine 0.44 - 1.00 mg/dL 0.93  Sodium 135 - 145 mmol/L 141  Potassium 3.5 - 5.1 mmol/L 3.5  Chloride 98 - 111 mmol/L 102  CO2 22 - 32 mmol/L 30  Calcium 8.9 - 10.3 mg/dL 9.3  Total Protein 6.5 - 8.1 g/dL 6.9  Total Bilirubin 0.3 - 1.2 mg/dL 0.7  Alkaline Phos 38 - 126 U/L 47  AST 15 - 41 U/L 23  ALT 0 - 44 U/L 11   CBC Latest Ref Rng & Units 08/02/2020  WBC 4.0 - 10.5 K/uL 2.5(L)  Hemoglobin 12.0 - 15.0 g/dL 11.7(L)  Hematocrit 36 - 46 % 36.7  Platelets 150 - 400 K/uL 202    LABORATORY DATA:  I have reviewed the data as listed Lab Results  Component Value Date   WBC 2.5 (L) 08/02/2020   HGB 11.7 (L) 08/02/2020   HCT 36.7 08/02/2020   MCV 93.4 08/02/2020    PLT 202 08/02/2020   Recent Labs    03/19/20 0826 03/19/20 0826 06/23/20 1124 06/23/20 1124 07/16/20 0802 07/23/20 1345 08/02/20 0823  NA 142   < > 140   < > 140 137 141  K 4.1   < > 3.7   < > 3.2* 3.5 3.5  CL 102   < > 100   < > 101 99 102  CO2 31   < > 30   < > $R'29 29 30  'ds$ GLUCOSE 95   < > 114*   < > 114* 112* 96  BUN 12   < > 10   < > $R'9 11 8  'rH$ CREATININE 1.01*   < > 0.97   < > 0.87 0.80 0.93  CALCIUM 9.9   < > 9.1   < > 9.2 8.8* 9.3  GFRNONAA 56*   < > 58*   < > >60 >60 >60  GFRAA >60  --  >60  --  >60  --   --   PROT 7.5   < > 7.0   < > 6.9 6.8 6.9  ALBUMIN 4.4   < > 4.0   < > 3.6 3.7 3.7  AST 25   < > 25   < > $R'24 25 23  'Qw$ ALT 16   < > 15   < >  $'14 22 11  'R$ ALKPHOS 37*   < > 41   < > 41 45 47  BILITOT 0.8   < > 0.4   < > 0.5 0.6 0.7   < > = values in this interval not displayed.   Iron/TIBC/Ferritin/ %Sat    Component Value Date/Time   IRON 40 06/23/2020 1124   TIBC 246 (L) 06/23/2020 1124   FERRITIN 338 (H) 06/23/2020 1124   IRONPCTSAT 16 06/23/2020 1124     RADIOGRAPHIC STUDIES: I have personally reviewed the radiological images as listed and agreed with the findings in the report. CT CHEST ABDOMEN PELVIS W CONTRAST  Result Date: 06/25/2020 CLINICAL DATA:  History of rectal cancer and vaginal bleeding with elevated CEA EXAM: CT CHEST, ABDOMEN, AND PELVIS WITH CONTRAST TECHNIQUE: Multidetector CT imaging of the chest, abdomen and pelvis was performed following the standard protocol during bolus administration of intravenous contrast. CONTRAST:  42mL OMNIPAQUE IOHEXOL 300 MG/ML  SOLN COMPARISON:  Feb 25, 2020 FINDINGS: CT CHEST FINDINGS Cardiovascular: RIGHT-sided Port-A-Cath terminates in the RIGHT atrium as before. Heart size is stable without pericardial effusion. Engorgement of central pulmonary vasculature slightly greater than 3 cm. Mediastinum/Nodes: Thoracic inlet structures are normal. No axillary lymphadenopathy. No mediastinal lymphadenopathy. No hilar  lymphadenopathy. Esophagus grossly normal. Lungs/Pleura: No suspicious pulmonary mass or nodule. Airways are patent. Tiny nodule along the fissure in the RIGHT lower lobe is stable at approximately 2-3 mm. The signs of lingular and RIGHT middle lobe scarring. Musculoskeletal: See below for full musculoskeletal detail. Spinal degenerative changes. No acute process involving the bony thorax. CT ABDOMEN PELVIS FINDINGS Hepatobiliary: No focal, suspicious hepatic lesion. Post cholecystectomy with less biliary duct distension that was evident on the prior study. Tiny hypodensity in the RIGHT hepatic lobe is stable. The portal vein is patent. Pancreas: Pancreas without inflammation or focal lesion. Mild ductal distension unchanged. Spleen: Spleen normal in size and contour without focal lesion. Adrenals/Urinary Tract: Adrenal glands are normal. Cysts of the bilateral kidneys are unchanged. No hydronephrosis. Urinary bladder under distended. Stomach/Bowel: Signs of APR with LEFT lower quadrant colostomy. Fluid density along the RIGHT lateral margin of the colon in the ostomy has increased slowly over time. To a lesser extent this is true along the LEFT lateral margin. There is no stranding about the colon as it passes through the abdominal wall. Greatest thickness of fluid at the level of the ostomy approximately 2 cm, previously approximately 1.4 cm. Vascular/Lymphatic: Calcified and noncalcified atheromatous plaque of the abdominal aorta. There is no gastrohepatic or hepatoduodenal ligament lymphadenopathy. No retroperitoneal or mesenteric lymphadenopathy. No pelvic sidewall lymphadenopathy. Reproductive: Increasing fullness about the vaginal introitus at the pelvic floor with masslike appearance suggested in this location best seen on image 118 of series 2 and image 73 of series 4 this is of uncertain significance but suspicious certainly more pronounced than in 12/23/18 and more pronounced than on the prior  exam Other: No peritoneal nodularity or ascites. Musculoskeletal: No acute bone process. No destructive bone finding. Spinal degenerative changes. IMPRESSION: 1. More masslike appearance of the vaginal introitus and lower vagina. The possibility of disease in this area is considered, this is likely amenable to direct clinical inspection for signs of mass. 2. Fluid density about the colon slightly increasing over time. The possibility disease in this area is not excluded as there is a lack of significant colonic inflammation in this location. Focused ultrasound and aspiration could be performed though findings about the vagina are more suspicious  particularly given the report of close vaginal margins and the irregular appearance on CT that is definitely changed since the baseline CT evaluation of February of 2020 3. No findings to suggest metastatic disease to the chest. 4. Engorgement of central pulmonary vasculature slightly greater than 3 cm, raising the question of pulmonary arterial hypertension. 5. Stable biliary and pancreatic ductal distension. 6. Aortic atherosclerosis. These results will be called to the ordering clinician or representative by the Radiologist Assistant, and communication documented in the PACS or [employer]. Aortic Atherosclerosis (ICD10-I70.0). Electronically Signed   By: Zetta Bills M.D.   On: 06/25/2020 15:42   NM PET Image Restag (PS) Skull Base To Thigh  Result Date: 07/20/2020 CLINICAL DATA:  Subsequent treatment strategy for rectal cancer. Chemotherapy last Friday. Radiation therapy most recent 1 year ago. Vaginal biopsy 1 week ago, demonstrating involvement by colorectal adenocarcinoma. History of rectovaginal fistula. COVID vaccine x2 most recent 12/23/2019. EXAM: NUCLEAR MEDICINE PET SKULL BASE TO THIGH TECHNIQUE: 8.8 mCi F-18 FDG was injected intravenously. Full-ring PET imaging was performed from the skull base to thigh after the radiotracer. CT data was obtained  and used for attenuation correction and anatomic localization. Fasting blood glucose: 86 mg/dl COMPARISON:  CTs 06/25/2020. FINDINGS: Mediastinal blood pool activity: SUV max 2.1 Liver activity: SUV max NA NECK: No areas of abnormal hypermetabolism. Incidental CT findings: No cervical adenopathy. CHEST: No pulmonary parenchymal or thoracic nodal hypermetabolism. Incidental CT findings: Deferred to recent diagnostic CT. Right Port-A-Cath tip mid to low right atrium. Aortic and coronary artery atherosclerosis. ABDOMEN/PELVIS: No abdominopelvic nodal hypermetabolism. There is hypermetabolism at the descending colon along its peripheral course through the ostomy (status post abdominal perineal resection). Example at a S.U.V. max of 5.0 on 184/3. This is separate from the right-sided fluid described on the prior diagnostic CT, which is relatively similar at maximally 2.1 cm on 181/3. Circumferential wall thickening and hypermetabolism within the vaginal introitus. Example at a S.U.V. max of 12.3 on 236/3. Incidental CT findings: Deferred to recent diagnostic CT. Low-density bilateral renal lesions are likely cysts. Right abdominal wall hernia repair. Hysterectomy. Scattered colonic diverticula. SKELETON: No abnormal marrow activity. Incidental CT findings: Osteopenia IMPRESSION: 1. Circumferential wall thickening and hypermetabolism involving the vaginal introitus, consistent with the recent biopsy results of metastatic colorectal carcinoma. 2. Status post abdominal perineal resection with left abdominal ostomy. Hypermetabolism within the colon within the ostomy could be physiologic. This should be amenable to physical exam correlation to exclude residual/recurrent disease. 3. No other evidence of hypermetabolic metastasis. 4. Incidental findings, including: Aortic atherosclerosis (ICD10-I70.0) and emphysema (ICD10-J43.9). Electronically Signed   By: Abigail Miyamoto M.D.   On: 07/20/2020 14:15    CT CHEST ABDOMEN PELVIS  W CONTRAST  Result Date: 06/25/2020 CLINICAL DATA:  History of rectal cancer and vaginal bleeding with elevated CEA EXAM: CT CHEST, ABDOMEN, AND PELVIS WITH CONTRAST TECHNIQUE: Multidetector CT imaging of the chest, abdomen and pelvis was performed following the standard protocol during bolus administration of intravenous contrast. CONTRAST:  53mL OMNIPAQUE IOHEXOL 300 MG/ML  SOLN COMPARISON:  Feb 25, 2020 FINDINGS: CT CHEST FINDINGS Cardiovascular: RIGHT-sided Port-A-Cath terminates in the RIGHT atrium as before. Heart size is stable without pericardial effusion. Engorgement of central pulmonary vasculature slightly greater than 3 cm. Mediastinum/Nodes: Thoracic inlet structures are normal. No axillary lymphadenopathy. No mediastinal lymphadenopathy. No hilar lymphadenopathy. Esophagus grossly normal. Lungs/Pleura: No suspicious pulmonary mass or nodule. Airways are patent. Tiny nodule along the fissure in the RIGHT lower lobe is stable at  approximately 2-3 mm. The signs of lingular and RIGHT middle lobe scarring. Musculoskeletal: See below for full musculoskeletal detail. Spinal degenerative changes. No acute process involving the bony thorax. CT ABDOMEN PELVIS FINDINGS Hepatobiliary: No focal, suspicious hepatic lesion. Post cholecystectomy with less biliary duct distension that was evident on the prior study. Tiny hypodensity in the RIGHT hepatic lobe is stable. The portal vein is patent. Pancreas: Pancreas without inflammation or focal lesion. Mild ductal distension unchanged. Spleen: Spleen normal in size and contour without focal lesion. Adrenals/Urinary Tract: Adrenal glands are normal. Cysts of the bilateral kidneys are unchanged. No hydronephrosis. Urinary bladder under distended. Stomach/Bowel: Signs of APR with LEFT lower quadrant colostomy. Fluid density along the RIGHT lateral margin of the colon in the ostomy has increased slowly over time. To a lesser extent this is true along the LEFT lateral  margin. There is no stranding about the colon as it passes through the abdominal wall. Greatest thickness of fluid at the level of the ostomy approximately 2 cm, previously approximately 1.4 cm. Vascular/Lymphatic: Calcified and noncalcified atheromatous plaque of the abdominal aorta. There is no gastrohepatic or hepatoduodenal ligament lymphadenopathy. No retroperitoneal or mesenteric lymphadenopathy. No pelvic sidewall lymphadenopathy. Reproductive: Increasing fullness about the vaginal introitus at the pelvic floor with masslike appearance suggested in this location best seen on image 118 of series 2 and image 73 of series 4 this is of uncertain significance but suspicious certainly more pronounced than in 12-16-2018 and more pronounced than on the prior exam Other: No peritoneal nodularity or ascites. Musculoskeletal: No acute bone process. No destructive bone finding. Spinal degenerative changes. IMPRESSION: 1. More masslike appearance of the vaginal introitus and lower vagina. The possibility of disease in this area is considered, this is likely amenable to direct clinical inspection for signs of mass. 2. Fluid density about the colon slightly increasing over time. The possibility disease in this area is not excluded as there is a lack of significant colonic inflammation in this location. Focused ultrasound and aspiration could be performed though findings about the vagina are more suspicious particularly given the report of close vaginal margins and the irregular appearance on CT that is definitely changed since the baseline CT evaluation of 12/16/18 3. No findings to suggest metastatic disease to the chest. 4. Engorgement of central pulmonary vasculature slightly greater than 3 cm, raising the question of pulmonary arterial hypertension. 5. Stable biliary and pancreatic ductal distension. 6. Aortic atherosclerosis. These results will be called to the ordering clinician or representative by the  Radiologist Assistant, and communication documented in the PACS or [employer]. Aortic Atherosclerosis (ICD10-I70.0). Electronically Signed   By: Donzetta Kohut M.D.   On: 06/25/2020 15:42   NM PET Image Restag (PS) Skull Base To Thigh  Result Date: 07/20/2020 CLINICAL DATA:  Subsequent treatment strategy for rectal cancer. Chemotherapy last Friday. Radiation therapy most recent 1 year ago. Vaginal biopsy 1 week ago, demonstrating involvement by colorectal adenocarcinoma. History of rectovaginal fistula. COVID vaccine x2 most recent 12/23/2019. EXAM: NUCLEAR MEDICINE PET SKULL BASE TO THIGH TECHNIQUE: 8.8 mCi F-18 FDG was injected intravenously. Full-ring PET imaging was performed from the skull base to thigh after the radiotracer. CT data was obtained and used for attenuation correction and anatomic localization. Fasting blood glucose: 86 mg/dl COMPARISON:  CTs 68/96/9352. FINDINGS: Mediastinal blood pool activity: SUV max 2.1 Liver activity: SUV max NA NECK: No areas of abnormal hypermetabolism. Incidental CT findings: No cervical adenopathy. CHEST: No pulmonary parenchymal or thoracic  nodal hypermetabolism. Incidental CT findings: Deferred to recent diagnostic CT. Right Port-A-Cath tip mid to low right atrium. Aortic and coronary artery atherosclerosis. ABDOMEN/PELVIS: No abdominopelvic nodal hypermetabolism. There is hypermetabolism at the descending colon along its peripheral course through the ostomy (status post abdominal perineal resection). Example at a S.U.V. max of 5.0 on 184/3. This is separate from the right-sided fluid described on the prior diagnostic CT, which is relatively similar at maximally 2.1 cm on 181/3. Circumferential wall thickening and hypermetabolism within the vaginal introitus. Example at a S.U.V. max of 12.3 on 236/3. Incidental CT findings: Deferred to recent diagnostic CT. Low-density bilateral renal lesions are likely cysts. Right abdominal wall hernia repair. Hysterectomy.  Scattered colonic diverticula. SKELETON: No abnormal marrow activity. Incidental CT findings: Osteopenia IMPRESSION: 1. Circumferential wall thickening and hypermetabolism involving the vaginal introitus, consistent with the recent biopsy results of metastatic colorectal carcinoma. 2. Status post abdominal perineal resection with left abdominal ostomy. Hypermetabolism within the colon within the ostomy could be physiologic. This should be amenable to physical exam correlation to exclude residual/recurrent disease. 3. No other evidence of hypermetabolic metastasis. 4. Incidental findings, including: Aortic atherosclerosis (ICD10-I70.0) and emphysema (ICD10-J43.9). Electronically Signed   By: Abigail Miyamoto M.D.   On: 07/20/2020 14:15    ASSESSMENT & PLAN:  1. Rectal cancer ([place])   2. Encounter for antineoplastic chemotherapy   3. Vaginal bleeding   4. Neutropenia, unspecified type [facility])   Cancer Staging Rectal cancer [facility]) Staging form: Colon and Rectum, AJCC 8th Edition - Clinical stage from 01/23/2019: Stage IIIC (cT4b, cN1a, cM0) - Signed by Earlie Server, MD on 01/23/2019 - Pathologic stage from 10/06/2019: Stage IIC (ypT4b, pN0, cM0) - Signed by Earlie Server, MD on 10/06/2019  #History of stage IIIC Rectal cancer, s/p TNT, followed by 09/17/19 APR/posterior vaginectomy/TAH/BSO/VY-flap pT4b pN0 with close vaginal margin 0.2 mm.  Uterus and ovaries negative for malignancy. Developed local recurrence.  Labs reviewed and discussed with patient. ANC level is 1.  Patient has already taken her morning dose of Xeloda 1500 mg.  I asked patient to hold this evening's dose and tomorrow's dose.  Repeat CBC in 2 days.  Patient reports that she has antiemetics at home. Continue radiation.  #Vaginal spotting due to malignancy.  Hemoglobin is stable. #History of acute pulmonary embolism, bilateral DVT, unprovoked, continue Eliquis 2.5 mg twice daily.  Follow-up 1 week for reevaluation.  Earlie Server, MD, PhD

## 2020-08-03 ENCOUNTER — Ambulatory Visit
Admission: RE | Admit: 2020-08-03 | Discharge: 2020-08-03 | Disposition: A | Discharge status: Home / Self Care | Payer: Medicare Other | Source: Ambulatory Visit | Attending: Radiation Oncology | Admitting: Radiation Oncology

## 2020-08-03 DIAGNOSIS — C20 Malignant neoplasm of rectum: Secondary | ICD-10-CM | POA: Diagnosis not present

## 2020-08-04 ENCOUNTER — Ambulatory Visit
Admission: RE | Admit: 2020-08-04 | Discharge: 2020-08-04 | Disposition: A | Discharge status: Home / Self Care | Payer: Medicare Other | Source: Ambulatory Visit | Attending: Radiation Oncology | Admitting: Radiation Oncology

## 2020-08-04 ENCOUNTER — Inpatient Hospital Stay: Payer: Medicare Other

## 2020-08-04 ENCOUNTER — Other Ambulatory Visit: Payer: Self-pay

## 2020-08-04 ENCOUNTER — Other Ambulatory Visit: Payer: Medicare Other

## 2020-08-04 ENCOUNTER — Ambulatory Visit: Payer: Medicare Other | Admitting: Oncology

## 2020-08-04 ENCOUNTER — Telehealth: Payer: Self-pay

## 2020-08-04 DIAGNOSIS — C20 Malignant neoplasm of rectum: Secondary | ICD-10-CM | POA: Diagnosis not present

## 2020-08-04 DIAGNOSIS — Z5111 Encounter for antineoplastic chemotherapy: Secondary | ICD-10-CM | POA: Diagnosis not present

## 2020-08-04 LAB — CBC WITH DIFFERENTIAL/PLATELET
Abs Immature Granulocytes: 0.01 10*3/uL (ref 0.00–0.07)
Basophils Absolute: 0 10*3/uL (ref 0.0–0.1)
Basophils Relative: 1 %
Eosinophils Absolute: 0.2 10*3/uL (ref 0.0–0.5)
Eosinophils Relative: 7 %
HCT: 33.5 % — ABNORMAL LOW (ref 36.0–46.0)
Hemoglobin: 11.1 g/dL — ABNORMAL LOW (ref 12.0–15.0)
Immature Granulocytes: 1 %
Lymphocytes Relative: 40 %
Lymphs Abs: 0.8 10*3/uL (ref 0.7–4.0)
MCH: 30.3 pg (ref 26.0–34.0)
MCHC: 33.1 g/dL (ref 30.0–36.0)
MCV: 91.5 fL (ref 80.0–100.0)
Monocytes Absolute: 0.4 10*3/uL (ref 0.1–1.0)
Monocytes Relative: 20 %
Neutro Abs: 0.6 10*3/uL — ABNORMAL LOW (ref 1.7–7.7)
Neutrophils Relative %: 31 %
Platelets: 200 10*3/uL (ref 150–400)
RBC: 3.66 MIL/uL — ABNORMAL LOW (ref 3.87–5.11)
RDW: 14.2 % (ref 11.5–15.5)
WBC: 2 10*3/uL — ABNORMAL LOW (ref 4.0–10.5)
nRBC: 0 % (ref 0.0–0.2)

## 2020-08-04 NOTE — Telephone Encounter (Signed)
Done

## 2020-08-04 NOTE — Telephone Encounter (Signed)
Per Dr. Tasia Catchings, Patient to hold Xeloda due to lo ANC &  repeat CBC on Friday. Rad onc notified aobut ANC result.  Patient informed.

## 2020-08-05 ENCOUNTER — Ambulatory Visit
Admission: RE | Admit: 2020-08-05 | Discharge: 2020-08-05 | Disposition: A | Discharge status: Home / Self Care | Payer: Medicare Other | Source: Ambulatory Visit | Attending: Radiation Oncology | Admitting: Radiation Oncology

## 2020-08-05 DIAGNOSIS — C20 Malignant neoplasm of rectum: Secondary | ICD-10-CM | POA: Diagnosis not present

## 2020-08-06 ENCOUNTER — Inpatient Hospital Stay: Payer: Medicare Other

## 2020-08-06 ENCOUNTER — Other Ambulatory Visit: Payer: Self-pay

## 2020-08-06 ENCOUNTER — Ambulatory Visit
Admission: RE | Admit: 2020-08-06 | Discharge: 2020-08-06 | Disposition: A | Discharge status: Home / Self Care | Payer: Medicare Other | Source: Ambulatory Visit | Attending: Radiation Oncology | Admitting: Radiation Oncology

## 2020-08-06 DIAGNOSIS — C20 Malignant neoplasm of rectum: Secondary | ICD-10-CM

## 2020-08-06 DIAGNOSIS — Z5111 Encounter for antineoplastic chemotherapy: Secondary | ICD-10-CM | POA: Diagnosis not present

## 2020-08-06 LAB — CBC WITH DIFFERENTIAL/PLATELET
Abs Immature Granulocytes: 0.04 10*3/uL (ref 0.00–0.07)
Basophils Absolute: 0 10*3/uL (ref 0.0–0.1)
Basophils Relative: 1 %
Eosinophils Absolute: 0.2 10*3/uL (ref 0.0–0.5)
Eosinophils Relative: 7 %
HCT: 33.4 % — ABNORMAL LOW (ref 36.0–46.0)
Hemoglobin: 11 g/dL — ABNORMAL LOW (ref 12.0–15.0)
Immature Granulocytes: 2 %
Lymphocytes Relative: 37 %
Lymphs Abs: 1 10*3/uL (ref 0.7–4.0)
MCH: 30.6 pg (ref 26.0–34.0)
MCHC: 32.9 g/dL (ref 30.0–36.0)
MCV: 93 fL (ref 80.0–100.0)
Monocytes Absolute: 0.4 10*3/uL (ref 0.1–1.0)
Monocytes Relative: 15 %
Neutro Abs: 1.1 10*3/uL — ABNORMAL LOW (ref 1.7–7.7)
Neutrophils Relative %: 38 %
Platelets: 209 10*3/uL (ref 150–400)
RBC: 3.59 MIL/uL — ABNORMAL LOW (ref 3.87–5.11)
RDW: 14.3 % (ref 11.5–15.5)
WBC: 2.7 10*3/uL — ABNORMAL LOW (ref 4.0–10.5)
nRBC: 0 % (ref 0.0–0.2)

## 2020-08-09 ENCOUNTER — Ambulatory Visit
Admission: RE | Admit: 2020-08-09 | Discharge: 2020-08-09 | Disposition: A | Discharge status: Home / Self Care | Payer: Medicare Other | Source: Ambulatory Visit | Attending: Radiation Oncology | Admitting: Radiation Oncology

## 2020-08-09 ENCOUNTER — Other Ambulatory Visit: Payer: Self-pay

## 2020-08-09 ENCOUNTER — Inpatient Hospital Stay (HOSPITAL_BASED_OUTPATIENT_CLINIC_OR_DEPARTMENT_OTHER): Payer: Medicare Other | Admitting: Licensed Clinical Social Worker

## 2020-08-09 ENCOUNTER — Encounter: Payer: Self-pay | Admitting: Licensed Clinical Social Worker

## 2020-08-09 ENCOUNTER — Inpatient Hospital Stay: Payer: Medicare Other

## 2020-08-09 DIAGNOSIS — C20 Malignant neoplasm of rectum: Secondary | ICD-10-CM | POA: Diagnosis not present

## 2020-08-09 DIAGNOSIS — Z800 Family history of malignant neoplasm of digestive organs: Secondary | ICD-10-CM

## 2020-08-09 NOTE — Progress Notes (Signed)
REFERRING PROVIDER: Earlie Server, MD Jacqueline Yoder,  [place] [address]  PRIMARY PROVIDER:  Kirk Ruths, MD  PRIMARY REASON FOR VISIT:  1. Rectal cancer ([place])   2. Family history of colon cancer      HISTORY OF PRESENT ILLNESS:   Jacqueline Yoder, a 74 y.o. female, was seen for a [facility] cancer genetics consultation at the request of Dr. Tasia Catchings due to a personal and family history of cancer.  Jacqueline Yoder presents to clinic today to discuss the possibility of a hereditary predisposition to cancer, genetic testing, and to further clarify her future cancer risks, as well as potential cancer risks for family members.   In 2020, Jacqueline Yoder was diagnosed with invasive colorectal adenocarcinoma, MMR normal,. MSI stable. This was treated with chemotherapy. She has developed a local recurrence.   CANCER HISTORY:  Oncology History  Rectal cancer ([place])  01/23/2019 Initial Diagnosis   Rectal cancer ([place])   01/23/2019 Cancer Staging   Staging form: Colon and Rectum, AJCC 8th Edition - Clinical stage from 01/23/2019: Stage IIIC (cT4b, cN1a, cM0) - Signed by Jacqueline Server, MD on 01/23/2019   02/04/2019 - 02/04/2019 Chemotherapy   The patient had capecitabine (XELODA) 150 MG tablet, 150 mg (100 % of original dose 150 mg), Oral, 2 times daily after meals, 1 of 1 cycle, Start date: 01/27/2019, End date: 04/03/2019 Dose modification: 150 mg (original dose 150 mg, Cycle 1) capecitabine (XELODA) 500 MG tablet, 1,500 mg (100 % of original dose 1,500 mg), Oral, 2 times daily after meals, 1 of 1 cycle, Start date: 01/27/2019, End date: 04/03/2019 Dose modification: 1,500 mg (original dose 1,500 mg, Cycle 1)  for chemotherapy treatment.    04/09/2019 - 07/18/2019 Chemotherapy   The patient had palonosetron (ALOXI) injection 0.25 mg, 0.25 mg, Intravenous,  Once, 8 of 8 cycles Administration: 0.25 mg (04/09/2019), 0.25 mg (04/23/2019), 0.25 mg (05/07/2019), 0.25 mg (05/21/2019), 0.25 mg (06/04/2019), 0.25 mg (06/18/2019),  0.25 mg (07/02/2019), 0.25 mg (07/16/2019) leucovorin 850 mg in dextrose 5 % 250 mL infusion, 844 mg, Intravenous,  Once, 8 of 8 cycles Administration: 850 mg (04/09/2019), 850 mg (04/23/2019), 850 mg (05/07/2019), 850 mg (05/21/2019), 800 mg (06/18/2019), 800 mg (07/02/2019), 800 mg (07/16/2019) oxaliplatin (ELOXATIN) 150 mg in dextrose 5 % 500 mL chemo infusion, 71 mg/m2 = 160 mg (100 % of original dose 75 mg/m2), Intravenous,  Once, 8 of 8 cycles Dose modification: 75 mg/m2 (original dose 75 mg/m2, Cycle 1, Reason: Provider Judgment) Administration: 150 mg (04/09/2019), 150 mg (04/23/2019), 150 mg (05/07/2019), 150 mg (05/21/2019), 150 mg (06/04/2019), 150 mg (06/18/2019), 150 mg (07/02/2019), 150 mg (07/16/2019) fluorouracil (ADRUCIL) 5,000 mg in sodium chloride 0.9 % 150 mL chemo infusion, 2,370 mg/m2 = 5,050 mg, Intravenous, 1 Day/Dose, 8 of 8 cycles Administration: 5,000 mg (04/09/2019), 5,000 mg (04/23/2019), 5,000 mg (05/07/2019), 5,000 mg (05/21/2019), 5,000 mg (06/04/2019), 5,000 mg (06/18/2019), 5,000 mg (07/02/2019), 5,000 mg (07/16/2019)  for chemotherapy treatment.    10/06/2019 Cancer Staging   Staging form: Colon and Rectum, AJCC 8th Edition - Pathologic stage from 10/06/2019: Stage IIC (ypT4b, pN0, cM0) - Signed by Jacqueline Server, MD on 10/06/2019   07/16/2020 -  Chemotherapy   The patient had palonosetron (ALOXI) injection 0.25 mg, 0.25 mg, Intravenous,  Once, 1 of 4 cycles Administration: 0.25 mg (07/16/2020) irinotecan (CAMPTOSAR) 280 mg in sodium chloride 0.9 % 500 mL chemo infusion, 150 mg/m2 = 280 mg (83.3 % of original dose 180 mg/m2), Intravenous,  Once, 1 of  4 cycles Dose modification: 150 mg/m2 (original dose 180 mg/m2, Cycle 1, Reason: Provider Judgment) Administration: 280 mg (07/16/2020) fluorouracil (ADRUCIL) chemo injection 750 mg, 400 mg/m2 = 750 mg, Intravenous,  Once, 1 of 4 cycles Administration: 750 mg (07/16/2020) fluorouracil (ADRUCIL) 4,400 mg in sodium chloride 0.9 % 62 mL chemo infusion, 2,400  mg/m2 = 4,400 mg, Intravenous, 1 Day/Dose, 1 of 4 cycles bevacizumab-awwb (MVASI) 400 mg in sodium chloride 0.9 % 100 mL chemo infusion, 5 mg/kg = 400 mg (100 % of original dose 5 mg/kg), Intravenous,  Once, 0 of 2 cycles Dose modification: 5 mg/kg (original dose 5 mg/kg, Cycle 3) leucovorin 750 mg in sodium chloride 0.9 % 250 mL infusion, 408 mg/m2 = 736 mg, Intravenous,  Once, 1 of 4 cycles Administration: 750 mg (07/16/2020)  for chemotherapy treatment.       RISK FACTORS:  Menarche was at age 61-11. First live birth at age 71.  Ovaries intact: no.  Hysterectomy: yes.  Menopausal status: postmenopausal.  Mammogram: 2019 Number of breast biopsies: 0.     Past Medical History:  Diagnosis Date  . Arthritis   . Family history of colon cancer   . GERD (gastroesophageal reflux disease)   . Hypercholesteremia   . Hypertension   . Hypertension   . Lower extremity edema   . Rectal cancer ([place]) 12/2018  . Urinary incontinence     Past Surgical History:  Procedure Laterality Date  . CHOLECYSTECTOMY  1971  . COLONOSCOPY WITH PROPOFOL N/A 12/03/2018   Procedure: COLONOSCOPY WITH PROPOFOL;  Surgeon: Lucilla Lame, MD;  Location[facility] ENDOSCOPY;  Service: Endoscopy;  Laterality: N/A;  . COLONOSCOPY WITH PROPOFOL N/A 07/15/2020   Procedure: COLONOSCOPY WITH PROPOFOL;  Surgeon: [name], MD;  Location: [facility] ENDOSCOPY;  Service: Gastroenterology;  Laterality: N/A;  . FLEXIBLE SIGMOIDOSCOPY N/A 12/06/2018   Procedure: FLEXIBLE SIGMOIDOSCOPY;  Surgeon: [name], MD;  Location: [facility] ENDOSCOPY;  Service: Endoscopy;  Laterality: N/A;  . LAPAROSCOPIC COLOSTOMY  01/06/2019  . PORTACATH PLACEMENT N/A 04/03/2019   Procedure: INSERTION PORT-A-CATH;  Surgeon: Jules Husbands, MD;  Location: [facility] ORS;  Service: General;  Laterality: N/A;    Social History   Socioeconomic History  . Marital status: Married    Spouse name: Not on file  . Number of children: Not on file  . Years of education:  Not on file  . Highest education level: Not on file  Occupational History  . Not on file  Tobacco Use  . Smoking status: Former Smoker    Quit date: 12/02/1977    Years since quitting: 42.7  . Smokeless tobacco: Never Used  Vaping Use  . Vaping Use: Never used  Substance and Sexual Activity  . Alcohol use: Never  . Drug use: Never  . Sexual activity: Not Currently    Birth control/protection: None  Other Topics Concern  . Not on file  Social History Narrative   Lives with daughter   Social Determinants of Health   Financial Resource Strain:   . Difficulty of Paying Living Expenses: Not on file  Food Insecurity:   . Worried About Charity fundraiser in the Last Year: Not on file  . Ran Out of Food in the Last Year: Not on file  Transportation Needs:   . Lack of Transportation (Medical): Not on file  . Lack of Transportation (Non-Medical): Not on file  Physical Activity:   . Days of Exercise per Week: Not on file  . Minutes of Exercise per  Session: Not on file  Stress:   . Feeling of Stress : Not on file  Social Connections:   . Frequency of Communication with Friends and Family: Not on file  . Frequency of Social Gatherings with Friends and Family: Not on file  . Attends Religious Services: Not on file  . Active Member of Clubs or Organizations: Not on file  . Attends Archivist Meetings: Not on file  . Marital Status: Not on file     FAMILY HISTORY:  We obtained a detailed, 4-generation family history.  Significant diagnoses are listed below: Family History  Problem Relation Age of Onset  . Colon cancer Brother 17  . Hypertension Mother   . Stroke Mother   . Kidney failure Father   . Breast cancer Neg Hx   . Ovarian cancer Neg Hx    Jacqueline Yoder has a son, 46, and two daughters, 83 and 9, no cancers. Patient had 2 brothers and 2 sisters. One brother has had colon cancer diagnosed in his 4s, but not prostate cancer.   Jacqueline Yoder mother died of a  stroke at 17. Patient had 11 maternal aunts/uncles and is unaware of any cancers in them or in her cousins. Maternal grandmother died of a stroke, unsure cause of death for her maternal grandfather.   Jacqueline Yoder's father died in his 60s of kidney failure. Patient had at least 3 paternal aunts and 1 paternal uncle, no cancers. No known cancers in cousins. Patient has no information about paternal grandmother, grandfather passed due to a stroke.   Jacqueline Yoder is unaware of previous family history of genetic testing for hereditary cancer risks. Patient's ancestors are of American Indian/unknown descent. There is no reported Ashkenazi Jewish ancestry. There is no known consanguinity.  GENETIC COUNSELING ASSESSMENT: Jacqueline Yoder is a 74 y.o. female with a personal and family history which is not particularly suggestive of a hereditary cancer syndrome.  We, therefore, discussed and recommended the following at today's visit.   DISCUSSION:  We discussed with Jacqueline Yoder that the personal and family history does not meet insurance or NCCN criteria for genetic testing and, therefore, is not highly consistent with a familial hereditary cancer syndrome.  We feel she is at low risk to harbor a gene mutation associated with such a condition. However, we still discussed the genetic testing in case she was interested.   We discussed that approximately 5-7% of colon cancer is hereditary  Most cases of hereditary colon cancer are associated with Lynch syndrome genes, although there are other genes associated with hereditary colon cancer as well. We discussed that testing is beneficial for several reasons including knowing about other cancer risks, identifying potential screening and risk-reduction options that may be appropriate, and to understand if other family members could be at risk for cancer and allow them to undergo genetic testing.   We reviewed the characteristics, features and inheritance patterns of  hereditary cancer syndromes. We also discussed genetic testing, including the appropriate family members to test, the process of testing, insurance coverage and turn-around-time for results. We discussed the implications of a negative, positive and/or variant of uncertain significant result. If interested, Jacqueline Yoder could pursue genetic testing for the [employer] Common Hereditary Cancers Panel.   PLAN:  Jacqueline Yoder did not wish to pursue genetic testing at today's visit. We understand this decision and remain available to coordinate genetic testing at any time in the future. We, therefore, recommend Jacqueline Yoder continue to follow the cancer  screening guidelines given by her oncology and primary healthcare provider.  Jacqueline Yoder questions were answered to her satisfaction today. Our contact information was provided should additional questions or concerns arise. Thank you for the referral and allowing [place] to share in the care of your patient.   Jacqueline Rogue, MS, [facility] Genetic Counselor [email or web address] Phone: [phone]  The patient was seen for a total of 25 minutes in face-to-face genetic counseling.  Dr. Grayland Ormond was available for discussion regarding this case.   _______________________________________________________________________ For Office Staff:  Number of people involved in session: 1 Was an Intern/ student involved with case: no

## 2020-08-09 NOTE — Progress Notes (Signed)
Nutrition Assessment   Reason for Assessment:  Referral from Dr. Tasia Catchings   ASSESSMENT:  75 year old female with recurrent colorectal adenocarcinoma.  Past medical history reviewed.  Patient receiving radiation.  Xeloda on hold.  Met with patient in clinic.  Patient reports that her appetite is good.  "I eat when I am hungry or have a taste for something."  Reports usually has oatmeal or cheese toast with juice and sometimes coffee or egg or cereal.  Lunch is sometimes sandwich or snacks (peanut butter crackers, cheese and crackers). Supper is meat and couple side items.  Drinks oral nutrition supplements when does not eat much.  Reports sometimes diarrhea and sometimes constipation.  Has ostomy and takes medication as needed.      Medications: reviewed   Labs: reviewed   Anthropometrics:   Height: 65 inches Weight: 165 lb 10/18 UBW: 170 lb in June 2021 BMI: 27  Noted 30 lb weight loss in the last year.  Patient reports some of that was intentional weight loss, unsure how much.   15% weight loss in the last year, concerning    NUTRITION DIAGNOSIS: Inadequate oral intake related to cancer and cancer related treatment side effects as evidenced by 15% weight loss in 1 year   INTERVENTION:  Encouraged small frequent meals. Encouraged good protein food at every meal and well-balanced diet.  Continue supplements as needed.   Monitor weight. Contact information given   MONITORING, EVALUATION, GOAL: weight trends, intake   Next Visit: Nov 22 after radiation  Kennetta Pavlovic B. Zenia Resides, [place], [place] Registered Dietitian [phone] (mobile)

## 2020-08-10 ENCOUNTER — Encounter: Payer: Self-pay | Admitting: Oncology

## 2020-08-10 ENCOUNTER — Inpatient Hospital Stay (HOSPITAL_BASED_OUTPATIENT_CLINIC_OR_DEPARTMENT_OTHER): Payer: Medicare Other | Admitting: Oncology

## 2020-08-10 ENCOUNTER — Ambulatory Visit
Admission: RE | Admit: 2020-08-10 | Discharge: 2020-08-10 | Disposition: A | Discharge status: Home / Self Care | Payer: Medicare Other | Source: Ambulatory Visit | Attending: Radiation Oncology | Admitting: Radiation Oncology

## 2020-08-10 ENCOUNTER — Inpatient Hospital Stay: Payer: Medicare Other

## 2020-08-10 VITALS — BP 124/77 | HR 75 | Temp 96.0°F | Resp 18 | Wt 166.2 lb

## 2020-08-10 DIAGNOSIS — Z5111 Encounter for antineoplastic chemotherapy: Secondary | ICD-10-CM | POA: Diagnosis not present

## 2020-08-10 DIAGNOSIS — D709 Neutropenia, unspecified: Secondary | ICD-10-CM

## 2020-08-10 DIAGNOSIS — C20 Malignant neoplasm of rectum: Secondary | ICD-10-CM | POA: Diagnosis not present

## 2020-08-10 DIAGNOSIS — N939 Abnormal uterine and vaginal bleeding, unspecified: Secondary | ICD-10-CM | POA: Diagnosis not present

## 2020-08-10 DIAGNOSIS — Z800 Family history of malignant neoplasm of digestive organs: Secondary | ICD-10-CM

## 2020-08-10 LAB — CBC WITH DIFFERENTIAL/PLATELET
Abs Immature Granulocytes: 0.05 10*3/uL (ref 0.00–0.07)
Basophils Absolute: 0 10*3/uL (ref 0.0–0.1)
Basophils Relative: 1 %
Eosinophils Absolute: 0.2 10*3/uL (ref 0.0–0.5)
Eosinophils Relative: 5 %
HCT: 36.8 % (ref 36.0–46.0)
Hemoglobin: 12 g/dL (ref 12.0–15.0)
Immature Granulocytes: 2 %
Lymphocytes Relative: 42 %
Lymphs Abs: 1.3 10*3/uL (ref 0.7–4.0)
MCH: 30.4 pg (ref 26.0–34.0)
MCHC: 32.6 g/dL (ref 30.0–36.0)
MCV: 93.2 fL (ref 80.0–100.0)
Monocytes Absolute: 0.3 10*3/uL (ref 0.1–1.0)
Monocytes Relative: 9 %
Neutro Abs: 1.3 10*3/uL — ABNORMAL LOW (ref 1.7–7.7)
Neutrophils Relative %: 41 %
Platelets: 257 10*3/uL (ref 150–400)
RBC: 3.95 MIL/uL (ref 3.87–5.11)
RDW: 14.6 % (ref 11.5–15.5)
WBC: 3.1 10*3/uL — ABNORMAL LOW (ref 4.0–10.5)
nRBC: 0 % (ref 0.0–0.2)

## 2020-08-10 LAB — COMPREHENSIVE METABOLIC PANEL
ALT: 16 U/L (ref 0–44)
AST: 27 U/L (ref 15–41)
Albumin: 3.8 g/dL (ref 3.5–5.0)
Alkaline Phosphatase: 44 U/L (ref 38–126)
Anion gap: 9 (ref 5–15)
BUN: 7 mg/dL — ABNORMAL LOW (ref 8–23)
CO2: 30 mmol/L (ref 22–32)
Calcium: 9.3 mg/dL (ref 8.9–10.3)
Chloride: 101 mmol/L (ref 98–111)
Creatinine, Ser: 0.94 mg/dL (ref 0.44–1.00)
GFR, Estimated: >60 mL/min (ref >60)
Glucose, Bld: 83 mg/dL (ref 70–99)
Potassium: 3.5 mmol/L (ref 3.5–5.1)
Sodium: 140 mmol/L (ref 135–145)
Total Bilirubin: 0.5 mg/dL (ref 0.3–1.2)
Total Protein: 7.1 g/dL (ref 6.5–8.1)

## 2020-08-10 LAB — URINALYSIS, DIPSTICK ONLY
Bilirubin Urine: NEGATIVE
Glucose, UA: NEGATIVE mg/dL
Ketones, ur: NEGATIVE mg/dL
Nitrite: NEGATIVE
Protein, ur: NEGATIVE mg/dL
Specific Gravity, Urine: 1.006 (ref 1.005–1.030)
pH: 8 (ref 5.0–8.0)

## 2020-08-10 NOTE — Progress Notes (Signed)
Hematology/Oncology progress note [facility] [phone] [phone]   Patient Care Team: Kirk Ruths, MD as PCP - General (Internal Medicine) Clent Jacks, RN as Registered Nurse  REFERRING PROVIDER: Dr.Secord CHIEF COMPLAINTS/REASON FOR VISIT:  Follow up for rectal cancer  HISTORY OF PRESENTING ILLNESS:  Jacqueline Yoder is a  74 y.o.  female with PMH listed below who was referred to me for evaluation of endometrial cancer.   Patient initially presented with complaints of postmenopausal bleeding on 08/16/2018.  History of was menopausal vaginal bleeding in 2016 which resulted in cervical polypectomy.  Pathology 02/04/2015 showed cervical polyp, consistent with benign endometrial polyp.  [name] after polypectomy due to anxiety associated with pelvic exams.  pelvic exam on 08/16/2018 reviewed cervical abnormality and from enlarged uterus. Seen by Dr. Marcelline Mates on 10/29/2018.  Endometrial biopsy and a Pap smear was performed. 10/29/2018 Pap smear showed adenocarcinoma, favor endometrial origin. 10/29/2018 endometrial biopsy showed endometrioid carcinoma, FIGO grade 1.  10/29/2018- TA & TV Ultrasound revealed: Anteverted uterus measuring 8.7 x 5.6 x 6.4 cm without evidence of focal masses.  The endometrium measuring 24.1 mm (thickened) and heterogeneous.  Right and left ovaries not visualized.  No adnexal masses identified.  No free fluid in cul-de-sac.  Patient was seen by Dr. Theora Gianotti in clinic on 11/13/2018.  Cervical exam reveals 2 cm exophytic irregular mass consistent with malignancy.   11/19/2018 CT chest abdomen pelvis with contrast showed thickened endometrium with some irregularity compatible with the provided diagnosis of endometrial malignancy.  There is a mildly prominent left inguinal node 1.4 cm.  Patient was seen by Dr. Fransisca Connors on 11/20/2018 and left groin lymph node biopsy was recommended.  11/26/2018 patient underwent  left inguinal lymph node biopsy. Pathology showed metastatic adenocarcinoma consistent with colorectal origin.  CDX 2+.  Case was discussed on tumor board.  Recommend colonoscopy for further evaluation.  Patient reports significant weight loss 30 pounds over the last year.  Chronic vaginal spotting. Change of bowel habits the past few months.  More constipated.  Family history positive for brother who has colon cancer prostate cancer.  patient has underwent colonoscopy on 12/03/2018 which reviewed a nonobstructing large mass in the rectum.  Also chronic fistula.  Mass was not circumferential.  This was biopsied with a cold forceps for histology.  Pathology came back hyperplastic polyp negative for dysplasia and malignancy. [name], patient underwent flex sigmoidoscopy on 12/06/2018 with rebiopsy of the rectal mass. This time biopsy results came back positive for invasive colorectal adenocarcinoma, moderately differentiated. Immunotherapy for nearly mismatch repair protein (MMR ) was performed.  There is no loss of MMR expression.  low probability of MSI high.   # Seen by [name] for evaluation of resectability for rectal cancer. In addition, she also had a second opinion with [facility] where her endometrial biopsy pathology was changed to  adenocarcinoma, consistent with colorectal primary.   [name]. She has home health that has been assisting with ostomy care  Patient was also evaluated by [facility] oncology.  Recommendation is to proceed with TNT with concurrent chemoradiation followed by neoadjuvant chemotherapy followed by surgical resection. Patient prefers to have treatment done locally with [facility].   # Oncology Treatment:  02/03/2019- 03/19/2019  concurrent Xeloda and radiation.  Xeloda dose 855m /m2 BID - rounded to 16555mBID- on days of radiation. 04/09/2019, started on FOLFOX with bolus 5-FU omitted.  07/16/2019  finished  8 cycles of FOLFOX.  # Patient has underwent 09/17/19 APR/posterior vaginectomy/TAH/BSO/VY-flap pT4b pN0 with close vaginal margin 0.2 mm.  Uterus and ovaries negative for malignancy. Patient reports bilateral lower extremity numbness and tingling, intermittent, left worse than right. She has lost a lot of weight since her APR surgery.   Surveillance plan Recommend history and physical/tumor marker monitoring every 3 to 6 months for the first 2 years after surgery and then every 6 months for a total of 5 years. Chest abdomen pelvis every 6 to 12 months for total 5 years. Colonoscopy 1 year after surgery, if no advanced adenoma, repeat in 3 years and then 5 years.  #Family history with half brother having's history of colon cancer prostate cancer.  Personal history of colorectal cancer.  Patient has not decided if she wants genetic testing.   # vaginal introitus mass biopsied. Pathology is consistent with metastatic colorectal adenocarcinoma  # history of PE( 01/13/2020)  in the bilateral lower extremity DVT (01/13/2020).   She finishes 6 months of anticoagulation with Eliquis 5 mg twice daily. Now switched to Eliquis 2.5 mg twice daily..  # She has now developed recurrent disease. I have discussed with [name]  Dr. Hester Mates and the mass is not resectable. Patient has also had colonoscopy by Dr. Vicente Males yesterday. Normal examination. # 07/16/2020 cycle 1 FOLFIRI  # 07/20/2020 PET scan was done for further evaluation, images are consistent with local recurrence, no distant metastasis. INTERVAL HISTORY Jacqueline Yoder is a 74 y.o. female who has above history reviewed by me presents for follow-up of rectal cancer. Patient has no new complaints. Patient is taking Eliquis 2.5 mg twice daily Started palliative radiation on 08/02/2020.  Vaginal spotting has resolved. Denies any fever, chills, nausea vomiting.  Review of Systems  Constitutional: Negative for appetite change, chills,  fatigue, fever and unexpected weight change.  HENT:   Negative for hearing loss and voice change.   Eyes: Negative for eye problems.  Respiratory: Negative for chest tightness and cough.   Cardiovascular: Negative for chest pain.  Gastrointestinal: Negative for abdominal distention, abdominal pain, blood in stool, constipation, diarrhea and nausea.  Endocrine: Negative for hot flashes.  Genitourinary: Negative for difficulty urinating and frequency.   Musculoskeletal: Negative for arthralgias.  Skin: Negative for itching and rash.  Neurological: Negative for extremity weakness and numbness.  Hematological: Negative for adenopathy.  Psychiatric/Behavioral: Negative for confusion.    MEDICAL HISTORY:  Past Medical History:  Diagnosis Date  . Arthritis   . Family history of colon cancer   . GERD (gastroesophageal reflux disease)   . Hypercholesteremia   . Hypertension   . Hypertension   . Lower extremity edema   . Rectal cancer ([place]) 12/2018  . Urinary incontinence     SURGICAL HISTORY: Past Surgical History:  Procedure Laterality Date  . CHOLECYSTECTOMY  1971  . COLONOSCOPY WITH PROPOFOL N/A 12/03/2018   Procedure: COLONOSCOPY WITH PROPOFOL;  Surgeon: Lucilla Lame, MD;  Location: [facility] ENDOSCOPY;  Service: Endoscopy;  Laterality: N/A;  . COLONOSCOPY WITH PROPOFOL N/A 07/15/2020   Procedure: COLONOSCOPY WITH PROPOFOL;  Surgeon: [name], MD;  Location: [facility] ENDOSCOPY;  Service: Gastroenterology;  Laterality: N/A;  . FLEXIBLE SIGMOIDOSCOPY N/A 12/06/2018   Procedure: FLEXIBLE SIGMOIDOSCOPY;  Surgeon: [name], MD;  Location: [facility] ENDOSCOPY;  Service: Endoscopy;  Laterality: N/A;  . LAPAROSCOPIC COLOSTOMY  01/06/2019  . PORTACATH PLACEMENT N/A 04/03/2019   Procedure: INSERTION PORT-A-CATH;  Surgeon: Jules Husbands, MD;  Location: [facility] ORS;  Service: General;  Laterality: N/A;    SOCIAL HISTORY: Social History   Socioeconomic History  . Marital status: Married    Spouse  name: Not on file  . Number of children: Not on file  . Years of education: Not on file  . Highest education level: Not on file  Occupational History  . Not on file  Tobacco Use  . Smoking status: Former Smoker    Quit date: 12/02/1977    Years since quitting: 42.7  . Smokeless tobacco: Never Used  Vaping Use  . Vaping Use: Never used  Substance and Sexual Activity  . Alcohol use: Never  . Drug use: Never  . Sexual activity: Not Currently    Birth control/protection: None  Other Topics Concern  . Not on file  Social History Narrative   Lives with daughter   Social Determinants of Health   Financial Resource Strain:   . Difficulty of Paying Living Expenses: Not on file  Food Insecurity:   . Worried About Charity fundraiser in the Last Year: Not on file  . Ran Out of Food in the Last Year: Not on file  Transportation Needs:   . Lack of Transportation (Medical): Not on file  . Lack of Transportation (Non-Medical): Not on file  Physical Activity:   . Days of Exercise per Week: Not on file  . Minutes of Exercise per Session: Not on file  Stress:   . Feeling of Stress : Not on file  Social Connections:   . Frequency of Communication with Friends and Family: Not on file  . Frequency of Social Gatherings with Friends and Family: Not on file  . Attends Religious Services: Not on file  . Active Member of Clubs or Organizations: Not on file  . Attends Archivist Meetings: Not on file  . Marital Status: Not on file  Intimate Partner Violence:   . Fear of Current or Ex-Partner: Not on file  . Emotionally Abused: Not on file  . Physically Abused: Not on file  . Sexually Abused: Not on file    FAMILY HISTORY: Family History  Problem Relation Age of Onset  . Colon cancer Brother 100  . Hypertension Mother   . Stroke Mother   . Kidney failure Father   . Breast cancer Neg Hx   . Ovarian cancer Neg Hx     ALLERGIES:  is allergic to  sulfamethoxazole-trimethoprim.  MEDICATIONS:  Current Outpatient Medications  Medication Sig Dispense Refill  . apixaban (ELIQUIS) 2.5 MG TABS tablet Take 1 tablet (2.5 mg total) by mouth 2 (two) times daily. 60 tablet 0  . Cholecalciferol (VITAMIN D3) 2000 units capsule Take 2,000 Units by mouth daily.    Marland Kitchen docusate sodium (COLACE) 100 MG capsule Take 100 mg by mouth 2 (two) times daily.    . fexofenadine (ALLEGRA) 180 MG tablet Take 180 mg by mouth daily.    . fluticasone (FLONASE) 50 MCG/ACT nasal spray Place 1 spray into both nostrils 2 (two) times a day.     . lidocaine-prilocaine (EMLA) cream Apply 1 application topically as needed. 30 g 6  . Multiple Vitamins-Minerals (ONE-A-DAY WOMENS 50 PLUS PO) Take 1 tablet by mouth daily.     . ondansetron (ZOFRAN) 8 MG tablet Take 1 tablet (8 mg total) by mouth every 8 (eight) hours as needed for nausea, vomiting or refractory nausea / vomiting. 90 tablet 1  . potassium chloride SA (K-DUR) 20 MEQ tablet Take 20 mEq by mouth  daily.     . prochlorperazine (COMPAZINE) 10 MG tablet Take 1 tablet (10 mg total) by mouth every 6 (six) hours as needed (NAUSEA). 30 tablet 1  . simvastatin (ZOCOR) 40 MG tablet Take 40 mg by mouth at bedtime.     . triamterene-hydrochlorothiazide (DYAZIDE) 37.5-25 MG capsule Take 1 capsule by mouth daily.     . capecitabine (XELODA) 500 MG tablet Take 3 tablets (1,500 mg total) by mouth 2 (two) times daily after a meal. Take Monday-Friday, only on days of radiation. (Patient not taking: Reported on 08/10/2020) 150 tablet 0  . chlorhexidine (PERIDEX) 0.12 % solution Use as directed 15 mLs in the mouth or throat 2 (two) times daily. (Patient not taking: Reported on 08/02/2020) 473 mL 3  . Cinnamon Bark POWD Take by mouth.  (Patient not taking: Reported on 08/02/2020)    . diclofenac sodium (VOLTAREN) 1 % GEL Apply 2 g topically 4 (four) times daily as needed (joint pain).  (Patient not taking: Reported on 07/23/2020)  11  . FIBER  ADULT GUMMIES PO Take 1 Dose by mouth daily.  (Patient not taking: Reported on 08/02/2020)    . gabapentin (NEURONTIN) 100 MG capsule Take 1 capsule (100 mg total) by mouth at bedtime. (Patient not taking: Reported on 06/25/2020) 90 capsule 0  . HYDROcodone-acetaminophen (NORCO/VICODIN) 5-325 MG tablet Take 1 tablet by mouth every 4 (four) hours as needed for moderate pain. (Patient not taking: Reported on 06/25/2020) 8 tablet 0  . ipratropium (ATROVENT) 0.03 % nasal spray  (Patient not taking: Reported on 06/25/2020)    . ketorolac (ACULAR) 0.4 % SOLN Place 1 drop into both eyes daily.  (Patient not taking: Reported on 06/25/2020)    . loperamide (IMODIUM) 2 MG capsule Take 1 capsule (2 mg total) by mouth See admin instructions. With onset of loose stool, take 88m followed by 270mevery 2 hours,  Maximum: 16 mg/day (Patient not taking: Reported on 08/10/2020) 120 capsule 1  . Lutein 40 MG CAPS Take 40 mg by mouth daily.  (Patient not taking: Reported on 06/25/2020)    . Lutein-Bilberry (BILBERRY PLUS LUTEIN) 02-999 MCG-MG CAPS Take 1 capsule by mouth daily.  (Patient not taking: Reported on 06/25/2020)    . magic mouthwash w/lidocaine SOLN Take 5 mLs by mouth 4 (four) times daily as needed for mouth pain. Sig: Swish/Swallow 5-10 ml four times a day as needed (Patient not taking: Reported on 06/25/2020) 480 mL 3  . Ospemifene (OSPHENA) 60 MG TABS Take 1 tablet by mouth daily. (Patient not taking: Reported on 06/25/2020) 30 tablet 2  . pantoprazole (PROTONIX) 40 MG tablet Take 1 tablet (40 mg total) by mouth as needed. (Patient not taking: Reported on 06/25/2020) 30 tablet 0  . prednisoLONE acetate (PRED FORTE) 1 % ophthalmic suspension Place 1 drop into both eyes 2 (two) times a day.  (Patient not taking: Reported on 07/16/2020)  1   No current facility-administered medications for this visit.     PHYSICAL EXAMINATION: ECOG PERFORMANCE STATUS: 1 - Symptomatic but completely ambulatory Vitals:   08/10/20  1054  BP: 124/77  Pulse: 75  Resp: 18  Temp: (!) 96 F (35.6 C)   Filed Weights   08/10/20 1054  Weight: 166 lb 3.2 oz (75.4 kg)    Physical Exam Constitutional:      General: She is not in acute distress. HENT:     Head: Normocephalic and atraumatic.  Eyes:     General: No scleral icterus. Cardiovascular:  Rate and Rhythm: Normal rate and regular rhythm.     Heart sounds: Normal heart sounds.  Pulmonary:     Effort: Pulmonary effort is normal. No respiratory distress.     Breath sounds: No wheezing.  Abdominal:     General: Bowel sounds are normal. There is no distension.     Palpations: Abdomen is soft.     Comments: + Colostomy bag   Musculoskeletal:        General: No deformity. Normal range of motion.     Cervical back: Normal range of motion and neck supple.  Skin:    General: Skin is warm and dry.     Findings: No erythema or rash.  Neurological:     Mental Status: She is alert and oriented to person, place, and time. Mental status is at baseline.     Cranial Nerves: No cranial nerve deficit.     Coordination: Coordination normal.  Psychiatric:        Mood and Affect: Mood normal.    CMP Latest Ref Rng & Units 08/10/2020  Glucose 70 - 99 mg/dL 83  BUN 8 - 23 mg/dL 7(L)  Creatinine 0.44 - 1.00 mg/dL 0.94  Sodium 135 - 145 mmol/L 140  Potassium 3.5 - 5.1 mmol/L 3.5  Chloride 98 - 111 mmol/L 101  CO2 22 - 32 mmol/L 30  Calcium 8.9 - 10.3 mg/dL 9.3  Total Protein 6.5 - 8.1 g/dL 7.1  Total Bilirubin 0.3 - 1.2 mg/dL 0.5  Alkaline Phos 38 - 126 U/L 44  AST 15 - 41 U/L 27  ALT 0 - 44 U/L 16   CBC Latest Ref Rng & Units 08/10/2020  WBC 4.0 - 10.5 K/uL 3.1(L)  Hemoglobin 12.0 - 15.0 g/dL 12.0  Hematocrit 36 - 46 % 36.8  Platelets 150 - 400 K/uL 257    LABORATORY DATA:  I have reviewed the data as listed Lab Results  Component Value Date   WBC 3.1 (L) 08/10/2020   HGB 12.0 08/10/2020   HCT 36.8 08/10/2020   MCV 93.2 08/10/2020   PLT 257  08/10/2020   Recent Labs    03/19/20 0826 03/19/20 0826 06/23/20 1124 06/23/20 1124 07/16/20 0802 07/16/20 0802 07/23/20 1345 08/02/20 0823 08/10/20 0959  NA 142   < > 140   < > 140   < > 137 141 140  K 4.1   < > 3.7   < > 3.2*   < > 3.5 3.5 3.5  CL 102   < > 100   < > 101   < > 99 102 101  CO2 31   < > 30   < > 29   < > _0 GLUCOSE 95   < > 114*   < > 114*   < > 112* 96 83  BUN 12   < > 10   < > 9   < > 11 8 7*  CREATININE 1.01*   < > 0.97   < > 0.87   < > 0.80 0.93 0.94  CALCIUM 9.9   < > 9.1   < > 9.2   < > 8.8* 9.3 9.3  GFRNONAA 56*   < > 58*   < > >60  --  >60 >60 >60  GFRAA >60  --  >60  --  >60  --   --   --   --   PROT 7.5   < > 7.0   < >  6.9   < > 6.8 6.9 7.1  ALBUMIN 4.4   < > 4.0   < > 3.6   < > 3.7 3.7 3.8  AST 25   < > 25   < > 24   < > _0 ALT 16   < > 15   < > 14   < > _1 ALKPHOS 37*   < > 41   < > 41   < > 45 47 44  BILITOT 0.8   < > 0.4   < > 0.5   < > 0.6 0.7 0.5   < > = values in this interval not displayed.   Iron/TIBC/Ferritin/ %Sat    Component Value Date/Time   IRON 40 06/23/2020 1124   TIBC 246 (L) 06/23/2020 1124   FERRITIN 338 (H) 06/23/2020 1124   IRONPCTSAT 16 06/23/2020 1124     RADIOGRAPHIC STUDIES: I have personally reviewed the radiological images as listed and agreed with the findings in the report. CT CHEST ABDOMEN PELVIS W CONTRAST  Result Date: 06/25/2020 CLINICAL DATA:  History of rectal cancer and vaginal bleeding with elevated CEA EXAM: CT CHEST, ABDOMEN, AND PELVIS WITH CONTRAST TECHNIQUE: Multidetector CT imaging of the chest, abdomen and pelvis was performed following the standard protocol during bolus administration of intravenous contrast. CONTRAST:  15m OMNIPAQUE IOHEXOL 300 MG/ML  SOLN COMPARISON:  Feb 25, 2020 FINDINGS: CT CHEST FINDINGS Cardiovascular: RIGHT-sided Port-A-Cath terminates in the RIGHT atrium as before. Heart size is stable without pericardial effusion. Engorgement of central pulmonary  vasculature slightly greater than 3 cm. Mediastinum/Nodes: Thoracic inlet structures are normal. No axillary lymphadenopathy. No mediastinal lymphadenopathy. No hilar lymphadenopathy. Esophagus grossly normal. Lungs/Pleura: No suspicious pulmonary mass or nodule. Airways are patent. Tiny nodule along the fissure in the RIGHT lower lobe is stable at approximately 2-3 mm. The signs of lingular and RIGHT middle lobe scarring. Musculoskeletal: See below for full musculoskeletal detail. Spinal degenerative changes. No acute process involving the bony thorax. CT ABDOMEN PELVIS FINDINGS Hepatobiliary: No focal, suspicious hepatic lesion. Post cholecystectomy with less biliary duct distension that was evident on the prior study. Tiny hypodensity in the RIGHT hepatic lobe is stable. The portal vein is patent. Pancreas: Pancreas without inflammation or focal lesion. Mild ductal distension unchanged. Spleen: Spleen normal in size and contour without focal lesion. Adrenals/Urinary Tract: Adrenal glands are normal. Cysts of the bilateral kidneys are unchanged. No hydronephrosis. Urinary bladder under distended. Stomach/Bowel: Signs of APR with LEFT lower quadrant colostomy. Fluid density along the RIGHT lateral margin of the colon in the ostomy has increased slowly over time. To a lesser extent this is true along the LEFT lateral margin. There is no stranding about the colon as it passes through the abdominal wall. Greatest thickness of fluid at the level of the ostomy approximately 2 cm, previously approximately 1.4 cm. Vascular/Lymphatic: Calcified and noncalcified atheromatous plaque of the abdominal aorta. There is no gastrohepatic or hepatoduodenal ligament lymphadenopathy. No retroperitoneal or mesenteric lymphadenopathy. No pelvic sidewall lymphadenopathy. Reproductive: Increasing fullness about the vaginal introitus at the pelvic floor with masslike appearance suggested in this location best seen on image 118 of series 2  and image 73 of series 4 this is of uncertain significance but suspicious certainly more pronounced than in F2020/03/10and more pronounced than on the prior exam Other: No peritoneal nodularity or ascites. Musculoskeletal: No acute bone process. No destructive bone finding. Spinal degenerative changes. IMPRESSION: 1. More masslike  appearance of the vaginal introitus and lower vagina. The possibility of disease in this area is considered, this is likely amenable to direct clinical inspection for signs of mass. 2. Fluid density about the colon slightly increasing over time. The possibility disease in this area is not excluded as there is a lack of significant colonic inflammation in this location. Focused ultrasound and aspiration could be performed though findings about the vagina are more suspicious particularly given the report of close vaginal margins and the irregular appearance on CT that is definitely changed since the baseline CT evaluation of February of 2020 3. No findings to suggest metastatic disease to the chest. 4. Engorgement of central pulmonary vasculature slightly greater than 3 cm, raising the question of pulmonary arterial hypertension. 5. Stable biliary and pancreatic ductal distension. 6. Aortic atherosclerosis. These results will be called to the ordering clinician or representative by the Radiologist Assistant, and communication documented in the PACS or [employer]. Aortic Atherosclerosis (ICD10-I70.0). Electronically Signed   By: Zetta Bills M.D.   On: 06/25/2020 15:42   NM PET Image Restag (PS) Skull Base To Thigh  Result Date: 07/20/2020 CLINICAL DATA:  Subsequent treatment strategy for rectal cancer. Chemotherapy last Friday. Radiation therapy most recent 1 year ago. Vaginal biopsy 1 week ago, demonstrating involvement by colorectal adenocarcinoma. History of rectovaginal fistula. COVID vaccine x2 most recent 12/23/2019. EXAM: NUCLEAR MEDICINE PET SKULL BASE TO THIGH  TECHNIQUE: 8.8 mCi F-18 FDG was injected intravenously. Full-ring PET imaging was performed from the skull base to thigh after the radiotracer. CT data was obtained and used for attenuation correction and anatomic localization. Fasting blood glucose: 86 mg/dl COMPARISON:  CTs 06/25/2020. FINDINGS: Mediastinal blood pool activity: SUV max 2.1 Liver activity: SUV max NA NECK: No areas of abnormal hypermetabolism. Incidental CT findings: No cervical adenopathy. CHEST: No pulmonary parenchymal or thoracic nodal hypermetabolism. Incidental CT findings: Deferred to recent diagnostic CT. Right Port-A-Cath tip mid to low right atrium. Aortic and coronary artery atherosclerosis. ABDOMEN/PELVIS: No abdominopelvic nodal hypermetabolism. There is hypermetabolism at the descending colon along its peripheral course through the ostomy (status post abdominal perineal resection). Example at a S.U.V. max of 5.0 on 184/3. This is separate from the right-sided fluid described on the prior diagnostic CT, which is relatively similar at maximally 2.1 cm on 181/3. Circumferential wall thickening and hypermetabolism within the vaginal introitus. Example at a S.U.V. max of 12.3 on 236/3. Incidental CT findings: Deferred to recent diagnostic CT. Low-density bilateral renal lesions are likely cysts. Right abdominal wall hernia repair. Hysterectomy. Scattered colonic diverticula. SKELETON: No abnormal marrow activity. Incidental CT findings: Osteopenia IMPRESSION: 1. Circumferential wall thickening and hypermetabolism involving the vaginal introitus, consistent with the recent biopsy results of metastatic colorectal carcinoma. 2. Status post abdominal perineal resection with left abdominal ostomy. Hypermetabolism within the colon within the ostomy could be physiologic. This should be amenable to physical exam correlation to exclude residual/recurrent disease. 3. No other evidence of hypermetabolic metastasis. 4. Incidental findings, including:  Aortic atherosclerosis (ICD10-I70.0) and emphysema (ICD10-J43.9). Electronically Signed   By: Abigail Miyamoto M.D.   On: 07/20/2020 14:15    CT CHEST ABDOMEN PELVIS W CONTRAST  Result Date: 06/25/2020 CLINICAL DATA:  History of rectal cancer and vaginal bleeding with elevated CEA EXAM: CT CHEST, ABDOMEN, AND PELVIS WITH CONTRAST TECHNIQUE: Multidetector CT imaging of the chest, abdomen and pelvis was performed following the standard protocol during bolus administration of intravenous contrast. CONTRAST:  69m OMNIPAQUE IOHEXOL 300 MG/ML  SOLN COMPARISON:  Feb 25, 2020 FINDINGS: CT CHEST FINDINGS Cardiovascular: RIGHT-sided Port-A-Cath terminates in the RIGHT atrium as before. Heart size is stable without pericardial effusion. Engorgement of central pulmonary vasculature slightly greater than 3 cm. Mediastinum/Nodes: Thoracic inlet structures are normal. No axillary lymphadenopathy. No mediastinal lymphadenopathy. No hilar lymphadenopathy. Esophagus grossly normal. Lungs/Pleura: No suspicious pulmonary mass or nodule. Airways are patent. Tiny nodule along the fissure in the RIGHT lower lobe is stable at approximately 2-3 mm. The signs of lingular and RIGHT middle lobe scarring. Musculoskeletal: See below for full musculoskeletal detail. Spinal degenerative changes. No acute process involving the bony thorax. CT ABDOMEN PELVIS FINDINGS Hepatobiliary: No focal, suspicious hepatic lesion. Post cholecystectomy with less biliary duct distension that was evident on the prior study. Tiny hypodensity in the RIGHT hepatic lobe is stable. The portal vein is patent. Pancreas: Pancreas without inflammation or focal lesion. Mild ductal distension unchanged. Spleen: Spleen normal in size and contour without focal lesion. Adrenals/Urinary Tract: Adrenal glands are normal. Cysts of the bilateral kidneys are unchanged. No hydronephrosis. Urinary bladder under distended. Stomach/Bowel: Signs of APR with LEFT lower quadrant  colostomy. Fluid density along the RIGHT lateral margin of the colon in the ostomy has increased slowly over time. To a lesser extent this is true along the LEFT lateral margin. There is no stranding about the colon as it passes through the abdominal wall. Greatest thickness of fluid at the level of the ostomy approximately 2 cm, previously approximately 1.4 cm. Vascular/Lymphatic: Calcified and noncalcified atheromatous plaque of the abdominal aorta. There is no gastrohepatic or hepatoduodenal ligament lymphadenopathy. No retroperitoneal or mesenteric lymphadenopathy. No pelvic sidewall lymphadenopathy. Reproductive: Increasing fullness about the vaginal introitus at the pelvic floor with masslike appearance suggested in this location best seen on image 118 of series 2 and image 73 of series 4 this is of uncertain significance but suspicious certainly more pronounced than in 12-18-18 and more pronounced than on the prior exam Other: No peritoneal nodularity or ascites. Musculoskeletal: No acute bone process. No destructive bone finding. Spinal degenerative changes. IMPRESSION: 1. More masslike appearance of the vaginal introitus and lower vagina. The possibility of disease in this area is considered, this is likely amenable to direct clinical inspection for signs of mass. 2. Fluid density about the colon slightly increasing over time. The possibility disease in this area is not excluded as there is a lack of significant colonic inflammation in this location. Focused ultrasound and aspiration could be performed though findings about the vagina are more suspicious particularly given the report of close vaginal margins and the irregular appearance on CT that is definitely changed since the baseline CT evaluation of 12/18/18 3. No findings to suggest metastatic disease to the chest. 4. Engorgement of central pulmonary vasculature slightly greater than 3 cm, raising the question of pulmonary arterial  hypertension. 5. Stable biliary and pancreatic ductal distension. 6. Aortic atherosclerosis. These results will be called to the ordering clinician or representative by the Radiologist Assistant, and communication documented in the PACS or [employer]. Aortic Atherosclerosis (ICD10-I70.0). Electronically Signed   By: Zetta Bills M.D.   On: 06/25/2020 15:42   NM PET Image Restag (PS) Skull Base To Thigh  Result Date: 07/20/2020 CLINICAL DATA:  Subsequent treatment strategy for rectal cancer. Chemotherapy last Friday. Radiation therapy most recent 1 year ago. Vaginal biopsy 1 week ago, demonstrating involvement by colorectal adenocarcinoma. History of rectovaginal fistula. COVID vaccine x2 most recent 12/23/2019. EXAM: NUCLEAR MEDICINE PET SKULL BASE TO  THIGH TECHNIQUE: 8.8 mCi F-18 FDG was injected intravenously. Full-ring PET imaging was performed from the skull base to thigh after the radiotracer. CT data was obtained and used for attenuation correction and anatomic localization. Fasting blood glucose: 86 mg/dl COMPARISON:  CTs 06/25/2020. FINDINGS: Mediastinal blood pool activity: SUV max 2.1 Liver activity: SUV max NA NECK: No areas of abnormal hypermetabolism. Incidental CT findings: No cervical adenopathy. CHEST: No pulmonary parenchymal or thoracic nodal hypermetabolism. Incidental CT findings: Deferred to recent diagnostic CT. Right Port-A-Cath tip mid to low right atrium. Aortic and coronary artery atherosclerosis. ABDOMEN/PELVIS: No abdominopelvic nodal hypermetabolism. There is hypermetabolism at the descending colon along its peripheral course through the ostomy (status post abdominal perineal resection). Example at a S.U.V. max of 5.0 on 184/3. This is separate from the right-sided fluid described on the prior diagnostic CT, which is relatively similar at maximally 2.1 cm on 181/3. Circumferential wall thickening and hypermetabolism within the vaginal introitus. Example at a S.U.V. max of  12.3 on 236/3. Incidental CT findings: Deferred to recent diagnostic CT. Low-density bilateral renal lesions are likely cysts. Right abdominal wall hernia repair. Hysterectomy. Scattered colonic diverticula. SKELETON: No abnormal marrow activity. Incidental CT findings: Osteopenia IMPRESSION: 1. Circumferential wall thickening and hypermetabolism involving the vaginal introitus, consistent with the recent biopsy results of metastatic colorectal carcinoma. 2. Status post abdominal perineal resection with left abdominal ostomy. Hypermetabolism within the colon within the ostomy could be physiologic. This should be amenable to physical exam correlation to exclude residual/recurrent disease. 3. No other evidence of hypermetabolic metastasis. 4. Incidental findings, including: Aortic atherosclerosis (ICD10-I70.0) and emphysema (ICD10-J43.9). Electronically Signed   By: Abigail Miyamoto M.D.   On: 07/20/2020 14:15    ASSESSMENT & PLAN:  1. Family history of colon cancer   2. Encounter for antineoplastic chemotherapy   3. Vaginal bleeding   4. Neutropenia, unspecified type [facility])   Cancer Staging Rectal cancer [facility]) Staging form: Colon and Rectum, AJCC 8th Edition - Clinical stage from 01/23/2019: Stage IIIC (cT4b, cN1a, cM0) - Signed by Earlie Server, MD on 01/23/2019 - Pathologic stage from 10/06/2019: Stage IIC (ypT4b, pN0, cM0) - Signed by Earlie Server, MD on 10/06/2019  #History of stage IIIC Rectal cancer, s/p TNT, followed by 09/17/19 APR/posterior vaginectomy/TAH/BSO/VY-flap pT4b pN0 with close vaginal margin 0.2 mm.  Uterus and ovaries negative for malignancy. Developed local recurrence.  Labs reviewed and discussed with patient [place] has improved to 1.3. Recommend patient to continue hold off starting Xeloda. If [place] is able to improved above 1.5, she will start Xeloda treatments. Repeat CBC in 2 days.   #Vaginal spotting due to malignancy.  Hemoglobin is stable.  Spotting has resolved. #History of acute  pulmonary embolism, bilateral DVT, unprovoked, continue Eliquis 2.5 mg twice daily.  Follow-up 1 week for reevaluation.  Earlie Server, MD, PhD

## 2020-08-10 NOTE — Progress Notes (Signed)
Appetite is improving.  Patient denies new problems/concerns today.

## 2020-08-11 ENCOUNTER — Ambulatory Visit
Admission: RE | Admit: 2020-08-11 | Discharge: 2020-08-11 | Disposition: A | Discharge status: Home / Self Care | Payer: Medicare Other | Source: Ambulatory Visit | Attending: Radiation Oncology | Admitting: Radiation Oncology

## 2020-08-11 ENCOUNTER — Other Ambulatory Visit: Payer: Self-pay

## 2020-08-11 DIAGNOSIS — C20 Malignant neoplasm of rectum: Secondary | ICD-10-CM | POA: Diagnosis not present

## 2020-08-12 ENCOUNTER — Ambulatory Visit: Admission: RE | Admit: 2020-08-12 | Payer: Medicare Other | Source: Ambulatory Visit

## 2020-08-12 ENCOUNTER — Other Ambulatory Visit: Payer: Self-pay

## 2020-08-12 ENCOUNTER — Inpatient Hospital Stay: Payer: Medicare Other

## 2020-08-12 DIAGNOSIS — Z5111 Encounter for antineoplastic chemotherapy: Secondary | ICD-10-CM | POA: Diagnosis not present

## 2020-08-12 DIAGNOSIS — C20 Malignant neoplasm of rectum: Secondary | ICD-10-CM

## 2020-08-12 LAB — CBC WITH DIFFERENTIAL/PLATELET
Abs Immature Granulocytes: 0.04 10*3/uL (ref 0.00–0.07)
Basophils Absolute: 0 10*3/uL (ref 0.0–0.1)
Basophils Relative: 1 %
Eosinophils Absolute: 0.2 10*3/uL (ref 0.0–0.5)
Eosinophils Relative: 4 %
HCT: 35 % — ABNORMAL LOW (ref 36.0–46.0)
Hemoglobin: 11.3 g/dL — ABNORMAL LOW (ref 12.0–15.0)
Immature Granulocytes: 1 %
Lymphocytes Relative: 29 %
Lymphs Abs: 1.1 10*3/uL (ref 0.7–4.0)
MCH: 30.1 pg (ref 26.0–34.0)
MCHC: 32.3 g/dL (ref 30.0–36.0)
MCV: 93.1 fL (ref 80.0–100.0)
Monocytes Absolute: 0.3 10*3/uL (ref 0.1–1.0)
Monocytes Relative: 9 %
Neutro Abs: 2.2 10*3/uL (ref 1.7–7.7)
Neutrophils Relative %: 56 %
Platelets: 239 10*3/uL (ref 150–400)
RBC: 3.76 MIL/uL — ABNORMAL LOW (ref 3.87–5.11)
RDW: 14.7 % (ref 11.5–15.5)
WBC: 3.9 10*3/uL — ABNORMAL LOW (ref 4.0–10.5)
nRBC: 0 % (ref 0.0–0.2)

## 2020-08-13 ENCOUNTER — Ambulatory Visit
Admission: RE | Admit: 2020-08-13 | Discharge: 2020-08-13 | Disposition: A | Discharge status: Home / Self Care | Payer: Medicare Other | Source: Ambulatory Visit | Attending: Radiation Oncology | Admitting: Radiation Oncology

## 2020-08-13 DIAGNOSIS — C20 Malignant neoplasm of rectum: Secondary | ICD-10-CM | POA: Diagnosis not present

## 2020-08-16 ENCOUNTER — Ambulatory Visit
Admission: RE | Admit: 2020-08-16 | Discharge: 2020-08-16 | Disposition: A | Discharge status: Home / Self Care | Payer: Medicare Other | Source: Ambulatory Visit | Attending: Radiation Oncology | Admitting: Radiation Oncology

## 2020-08-16 DIAGNOSIS — Z510 Encounter for antineoplastic radiation therapy: Secondary | ICD-10-CM | POA: Insufficient documentation

## 2020-08-16 DIAGNOSIS — C20 Malignant neoplasm of rectum: Secondary | ICD-10-CM | POA: Insufficient documentation

## 2020-08-17 ENCOUNTER — Ambulatory Visit
Admission: RE | Admit: 2020-08-17 | Discharge: 2020-08-17 | Disposition: A | Discharge status: Home / Self Care | Payer: Medicare Other | Source: Ambulatory Visit | Attending: Radiation Oncology | Admitting: Radiation Oncology

## 2020-08-17 ENCOUNTER — Other Ambulatory Visit: Payer: Self-pay

## 2020-08-17 ENCOUNTER — Inpatient Hospital Stay: Payer: Medicare Other | Attending: Oncology

## 2020-08-17 ENCOUNTER — Encounter: Payer: Self-pay | Admitting: Oncology

## 2020-08-17 ENCOUNTER — Inpatient Hospital Stay (HOSPITAL_BASED_OUTPATIENT_CLINIC_OR_DEPARTMENT_OTHER): Payer: Medicare Other | Admitting: Oncology

## 2020-08-17 VITALS — BP 106/53 | HR 52 | Temp 97.4°F | Resp 18 | Wt 167.9 lb

## 2020-08-17 DIAGNOSIS — Z5111 Encounter for antineoplastic chemotherapy: Secondary | ICD-10-CM | POA: Diagnosis not present

## 2020-08-17 DIAGNOSIS — Z87891 Personal history of nicotine dependence: Secondary | ICD-10-CM | POA: Diagnosis not present

## 2020-08-17 DIAGNOSIS — E7800 Pure hypercholesterolemia, unspecified: Secondary | ICD-10-CM | POA: Diagnosis not present

## 2020-08-17 DIAGNOSIS — C20 Malignant neoplasm of rectum: Secondary | ICD-10-CM

## 2020-08-17 DIAGNOSIS — Z86711 Personal history of pulmonary embolism: Secondary | ICD-10-CM | POA: Diagnosis not present

## 2020-08-17 DIAGNOSIS — C775 Secondary and unspecified malignant neoplasm of intrapelvic lymph nodes: Secondary | ICD-10-CM | POA: Insufficient documentation

## 2020-08-17 DIAGNOSIS — N939 Abnormal uterine and vaginal bleeding, unspecified: Secondary | ICD-10-CM | POA: Insufficient documentation

## 2020-08-17 DIAGNOSIS — R634 Abnormal weight loss: Secondary | ICD-10-CM | POA: Diagnosis not present

## 2020-08-17 DIAGNOSIS — K5900 Constipation, unspecified: Secondary | ICD-10-CM | POA: Insufficient documentation

## 2020-08-17 DIAGNOSIS — I700 Atherosclerosis of aorta: Secondary | ICD-10-CM | POA: Insufficient documentation

## 2020-08-17 DIAGNOSIS — M129 Arthropathy, unspecified: Secondary | ICD-10-CM | POA: Insufficient documentation

## 2020-08-17 DIAGNOSIS — Z79899 Other long term (current) drug therapy: Secondary | ICD-10-CM | POA: Insufficient documentation

## 2020-08-17 DIAGNOSIS — Z86718 Personal history of other venous thrombosis and embolism: Secondary | ICD-10-CM | POA: Insufficient documentation

## 2020-08-17 DIAGNOSIS — I2510 Atherosclerotic heart disease of native coronary artery without angina pectoris: Secondary | ICD-10-CM | POA: Insufficient documentation

## 2020-08-17 DIAGNOSIS — Z510 Encounter for antineoplastic radiation therapy: Secondary | ICD-10-CM | POA: Diagnosis not present

## 2020-08-17 DIAGNOSIS — K219 Gastro-esophageal reflux disease without esophagitis: Secondary | ICD-10-CM | POA: Diagnosis not present

## 2020-08-17 DIAGNOSIS — I10 Essential (primary) hypertension: Secondary | ICD-10-CM | POA: Insufficient documentation

## 2020-08-17 LAB — COMPREHENSIVE METABOLIC PANEL
ALT: 16 U/L (ref 0–44)
AST: 30 U/L (ref 15–41)
Albumin: 4 g/dL (ref 3.5–5.0)
Alkaline Phosphatase: 38 U/L (ref 38–126)
Anion gap: 9 (ref 5–15)
BUN: 11 mg/dL (ref 8–23)
CO2: 29 mmol/L (ref 22–32)
Calcium: 9.4 mg/dL (ref 8.9–10.3)
Chloride: 101 mmol/L (ref 98–111)
Creatinine, Ser: 0.92 mg/dL (ref 0.44–1.00)
GFR, Estimated: >60 mL/min (ref >60)
Glucose, Bld: 80 mg/dL (ref 70–99)
Potassium: 3.7 mmol/L (ref 3.5–5.1)
Sodium: 139 mmol/L (ref 135–145)
Total Bilirubin: 0.5 mg/dL (ref 0.3–1.2)
Total Protein: 7.1 g/dL (ref 6.5–8.1)

## 2020-08-17 LAB — CBC WITH DIFFERENTIAL/PLATELET
Abs Immature Granulocytes: 0.02 10*3/uL (ref 0.00–0.07)
Basophils Absolute: 0 10*3/uL (ref 0.0–0.1)
Basophils Relative: 1 %
Eosinophils Absolute: 0.1 10*3/uL (ref 0.0–0.5)
Eosinophils Relative: 3 %
HCT: 36.1 % (ref 36.0–46.0)
Hemoglobin: 11.6 g/dL — ABNORMAL LOW (ref 12.0–15.0)
Immature Granulocytes: 1 %
Lymphocytes Relative: 29 %
Lymphs Abs: 1.1 10*3/uL (ref 0.7–4.0)
MCH: 29.8 pg (ref 26.0–34.0)
MCHC: 32.1 g/dL (ref 30.0–36.0)
MCV: 92.8 fL (ref 80.0–100.0)
Monocytes Absolute: 0.4 10*3/uL (ref 0.1–1.0)
Monocytes Relative: 10 %
Neutro Abs: 2.1 10*3/uL (ref 1.7–7.7)
Neutrophils Relative %: 56 %
Platelets: 223 10*3/uL (ref 150–400)
RBC: 3.89 MIL/uL (ref 3.87–5.11)
RDW: 15.1 % (ref 11.5–15.5)
WBC: 3.7 10*3/uL — ABNORMAL LOW (ref 4.0–10.5)
nRBC: 0 % (ref 0.0–0.2)

## 2020-08-17 NOTE — Progress Notes (Signed)
Hematology/Oncology progress note [facility] Telephone:([phone] [phone]   Patient Care Team: Kirk Ruths, MD as PCP - General (Internal Medicine) Clent Jacks, RN as Registered Nurse Earlie Server, MD as Consulting Physician (Hematology and Oncology)  REFERRING PROVIDER: Dr.Secord CHIEF COMPLAINTS/REASON FOR VISIT:  Follow up for rectal cancer  HISTORY OF PRESENTING ILLNESS:  Jacqueline Yoder is a  74 y.o.  female with PMH listed below who was referred to me for evaluation of endometrial cancer.   Patient initially presented with complaints of postmenopausal bleeding on 08/16/2018.  History of was menopausal vaginal bleeding in 2016 which resulted in cervical polypectomy.  Pathology 02/04/2015 showed cervical polyp, consistent with benign endometrial polyp.  [name] after polypectomy due to anxiety associated with pelvic exams.  pelvic exam on 08/16/2018 reviewed cervical abnormality and from enlarged uterus. Seen by Dr. Marcelline Mates on 10/29/2018.  Endometrial biopsy and a Pap smear was performed. 10/29/2018 Pap smear showed adenocarcinoma, favor endometrial origin. 10/29/2018 endometrial biopsy showed endometrioid carcinoma, FIGO grade 1.  10/29/2018- TA & TV Ultrasound revealed: Anteverted uterus measuring 8.7 x 5.6 x 6.4 cm without evidence of focal masses.  The endometrium measuring 24.1 mm (thickened) and heterogeneous.  Right and left ovaries not visualized.  No adnexal masses identified.  No free fluid in cul-de-sac.  Patient was seen by Dr. Theora Gianotti in clinic on 11/13/2018.  Cervical exam reveals 2 cm exophytic irregular mass consistent with malignancy.   11/19/2018 CT chest abdomen pelvis with contrast showed thickened endometrium with some irregularity compatible with the provided diagnosis of endometrial malignancy.  There is a mildly prominent left inguinal node 1.4 cm.  Patient was seen by Dr. Fransisca Connors on 11/20/2018 and left groin  lymph node biopsy was recommended.  11/26/2018 patient underwent left inguinal lymph node biopsy. Pathology showed metastatic adenocarcinoma consistent with colorectal origin.  CDX 2+.  Case was discussed on tumor board.  Recommend colonoscopy for further evaluation.  Patient reports significant weight loss 30 pounds over the last year.  Chronic vaginal spotting. Change of bowel habits the past few months.  More constipated.  Family history positive for brother who has colon cancer prostate cancer.  patient has underwent colonoscopy on 12/03/2018 which reviewed a nonobstructing large mass in the rectum.  Also chronic fistula.  Mass was not circumferential.  This was biopsied with a cold forceps for histology.  Pathology came back hyperplastic polyp negative for dysplasia and malignancy. [name], patient underwent flex sigmoidoscopy on 12/06/2018 with rebiopsy of the rectal mass. This time biopsy results came back positive for invasive colorectal adenocarcinoma, moderately differentiated. Immunotherapy for nearly mismatch repair protein (MMR ) was performed.  There is no loss of MMR expression.  low probability of MSI high.   # Seen by [name] for evaluation of resectability for rectal cancer. In addition, she also had a second opinion with [facility] where her endometrial biopsy pathology was changed to  adenocarcinoma, consistent with colorectal primary.   [name]. She has home health that has been assisting with ostomy care  Patient was also evaluated by [facility] oncology.  Recommendation is to proceed with TNT with concurrent chemoradiation followed by neoadjuvant chemotherapy followed by surgical resection. Patient prefers to have treatment done locally with [facility].   # Oncology Treatment:  02/03/2019- 03/19/2019  concurrent Xeloda and radiation.  Xeloda dose 872m /m2 BID - rounded to 16549mBID- on days of  radiation. 04/09/2019, started  on FOLFOX with bolus 5-FU omitted.  07/16/2019 finished 8 cycles of FOLFOX.  # Patient has underwent 09/17/19 APR/posterior vaginectomy/TAH/BSO/VY-flap pT4b pN0 with close vaginal margin 0.2 mm.  Uterus and ovaries negative for malignancy. Patient reports bilateral lower extremity numbness and tingling, intermittent, left worse than right. She has lost a lot of weight since her APR surgery.   Surveillance plan Recommend history and physical/tumor marker monitoring every 3 to 6 months for the first 2 years after surgery and then every 6 months for a total of 5 years. Chest abdomen pelvis every 6 to 12 months for total 5 years. Colonoscopy 1 year after surgery, if no advanced adenoma, repeat in 3 years and then 5 years.  #Family history with half brother having's history of colon cancer prostate cancer.  Personal history of colorectal cancer.  Patient has not decided if she wants genetic testing.   # vaginal introitus mass biopsied. Pathology is consistent with metastatic colorectal adenocarcinoma  # history of PE( 01/13/2020)  in the bilateral lower extremity DVT (01/13/2020).   She finishes 6 months of anticoagulation with Eliquis 5 mg twice daily. Now switched to Eliquis 2.5 mg twice daily..  # She has now developed recurrent disease. I have discussed with [name]  Dr. Hester Mates and the mass is not resectable. Patient has also had colonoscopy by Dr. Vicente Males yesterday. Normal examination. # 07/16/2020 cycle 1 FOLFIRI  # 07/20/2020 PET scan was done for further evaluation, images are consistent with local recurrence, no distant metastasis. INTERVAL HISTORY Jacqueline Yoder is a 74 y.o. female who has above history reviewed by me presents for follow-up of rectal cancer. Patient has no new complaints. Patient is taking Eliquis 2.5 mg twice daily Started palliative radiation on 08/02/2020.  Vaginal spotting has resolved. Patient tolerates well.  Minimal vaginal  spotting.  No fever, chills,  Review of Systems  Constitutional: Negative for appetite change, chills, fatigue, fever and unexpected weight change.  HENT:   Negative for hearing loss and voice change.   Eyes: Negative for eye problems.  Respiratory: Negative for chest tightness and cough.   Cardiovascular: Negative for chest pain.  Gastrointestinal: Negative for abdominal distention, abdominal pain, blood in stool, constipation, diarrhea and nausea.  Endocrine: Negative for hot flashes.  Genitourinary: Negative for difficulty urinating and frequency.   Musculoskeletal: Negative for arthralgias.  Skin: Negative for itching and rash.  Neurological: Negative for extremity weakness and numbness.  Hematological: Negative for adenopathy.  Psychiatric/Behavioral: Negative for confusion.    MEDICAL HISTORY:  Past Medical History:  Diagnosis Date  . Arthritis   . Family history of colon cancer   . GERD (gastroesophageal reflux disease)   . Hypercholesteremia   . Hypertension   . Hypertension   . Lower extremity edema   . Rectal cancer ([place]) 12/2018  . Urinary incontinence     SURGICAL HISTORY: Past Surgical History:  Procedure Laterality Date  . CHOLECYSTECTOMY  1971  . COLONOSCOPY WITH PROPOFOL N/A 12/03/2018   Procedure: COLONOSCOPY WITH PROPOFOL;  Surgeon: Lucilla Lame, MD;  Location: [facility] ENDOSCOPY;  Service: Endoscopy;  Laterality: N/A;  . COLONOSCOPY WITH PROPOFOL N/A 07/15/2020   Procedure: COLONOSCOPY WITH PROPOFOL;  Surgeon: [name], MD;  Location: [facility] ENDOSCOPY;  Service: Gastroenterology;  Laterality: N/A;  . FLEXIBLE SIGMOIDOSCOPY N/A 12/06/2018   Procedure: FLEXIBLE SIGMOIDOSCOPY;  Surgeon: [name], MD;  Location: [facility] ENDOSCOPY;  Service: Endoscopy;  Laterality: N/A;  . LAPAROSCOPIC COLOSTOMY  01/06/2019  . PORTACATH PLACEMENT N/A 04/03/2019  Procedure: INSERTION PORT-A-CATH;  Surgeon: Jules Husbands, MD;  Location: [facility] ORS;  Service: General;  Laterality: N/A;     SOCIAL HISTORY: Social History   Socioeconomic History  . Marital status: Married    Spouse name: Not on file  . Number of children: Not on file  . Years of education: Not on file  . Highest education level: Not on file  Occupational History  . Not on file  Tobacco Use  . Smoking status: Former Smoker    Quit date: 12/02/1977    Years since quitting: 42.7  . Smokeless tobacco: Never Used  Vaping Use  . Vaping Use: Never used  Substance and Sexual Activity  . Alcohol use: Never  . Drug use: Never  . Sexual activity: Not Currently    Birth control/protection: None  Other Topics Concern  . Not on file  Social History Narrative   Lives with daughter   Social Determinants of Health   Financial Resource Strain:   . Difficulty of Paying Living Expenses: Not on file  Food Insecurity:   . Worried About Charity fundraiser in the Last Year: Not on file  . Ran Out of Food in the Last Year: Not on file  Transportation Needs:   . Lack of Transportation (Medical): Not on file  . Lack of Transportation (Non-Medical): Not on file  Physical Activity:   . Days of Exercise per Week: Not on file  . Minutes of Exercise per Session: Not on file  Stress:   . Feeling of Stress : Not on file  Social Connections:   . Frequency of Communication with Friends and Family: Not on file  . Frequency of Social Gatherings with Friends and Family: Not on file  . Attends Religious Services: Not on file  . Active Member of Clubs or Organizations: Not on file  . Attends Archivist Meetings: Not on file  . Marital Status: Not on file  Intimate Partner Violence:   . Fear of Current or Ex-Partner: Not on file  . Emotionally Abused: Not on file  . Physically Abused: Not on file  . Sexually Abused: Not on file    FAMILY HISTORY: Family History  Problem Relation Age of Onset  . Colon cancer Brother 53  . Hypertension Mother   . Stroke Mother   . Kidney failure Father   . Breast  cancer Neg Hx   . Ovarian cancer Neg Hx     ALLERGIES:  is allergic to sulfamethoxazole-trimethoprim.  MEDICATIONS:  Current Outpatient Medications  Medication Sig Dispense Refill  . apixaban (ELIQUIS) 2.5 MG TABS tablet Take 1 tablet (2.5 mg total) by mouth 2 (two) times daily. 60 tablet 0  . chlorhexidine (PERIDEX) 0.12 % solution Use as directed 15 mLs in the mouth or throat 2 (two) times daily. 473 mL 3  . Cholecalciferol (VITAMIN D3) 2000 units capsule Take 2,000 Units by mouth daily.    . diclofenac sodium (VOLTAREN) 1 % GEL Apply 2 g topically 4 (four) times daily as needed (joint pain).   11  . docusate sodium (COLACE) 100 MG capsule Take 100 mg by mouth 2 (two) times daily.    . fexofenadine (ALLEGRA) 180 MG tablet Take 180 mg by mouth daily.    . fluticasone (FLONASE) 50 MCG/ACT nasal spray Place 1 spray into both nostrils 2 (two) times a day.     . gabapentin (NEURONTIN) 100 MG capsule Take 1 capsule (100 mg total) by mouth at bedtime.  90 capsule 0  . HYDROcodone-acetaminophen (NORCO/VICODIN) 5-325 MG tablet Take 1 tablet by mouth every 4 (four) hours as needed for moderate pain. 8 tablet 0  . ipratropium (ATROVENT) 0.03 % nasal spray     . lidocaine-prilocaine (EMLA) cream Apply 1 application topically as needed. 30 g 6  . loperamide (IMODIUM) 2 MG capsule Take 1 capsule (2 mg total) by mouth See admin instructions. With onset of loose stool, take 13m followed by 214mevery 2 hours,  Maximum: 16 mg/day 120 capsule 1  . magic mouthwash w/lidocaine SOLN Take 5 mLs by mouth 4 (four) times daily as needed for mouth pain. Sig: Swish/Swallow 5-10 ml four times a day as needed 480 mL 3  . Multiple Vitamins-Minerals (ONE-A-DAY WOMENS 50 PLUS PO) Take 1 tablet by mouth daily.     . ondansetron (ZOFRAN) 8 MG tablet Take 1 tablet (8 mg total) by mouth every 8 (eight) hours as needed for nausea, vomiting or refractory nausea / vomiting. 90 tablet 1  . potassium chloride SA (K-DUR) 20 MEQ  tablet Take 20 mEq by mouth daily.     . prochlorperazine (COMPAZINE) 10 MG tablet Take 1 tablet (10 mg total) by mouth every 6 (six) hours as needed (NAUSEA). 30 tablet 1  . simvastatin (ZOCOR) 40 MG tablet Take 40 mg by mouth at bedtime.     . triamterene-hydrochlorothiazide (DYAZIDE) 37.5-25 MG capsule Take 1 capsule by mouth daily.     . capecitabine (XELODA) 500 MG tablet Take 3 tablets (1,500 mg total) by mouth 2 (two) times daily after a meal. Take Monday-Friday, only on days of radiation. (Patient not taking: Reported on 08/10/2020) 150 tablet 0  . Cinnamon Bark POWD Take by mouth.  (Patient not taking: Reported on 08/02/2020)    . FIBER ADULT GUMMIES PO Take 1 Dose by mouth daily.  (Patient not taking: Reported on 08/02/2020)    . ketorolac (ACULAR) 0.4 % SOLN Place 1 drop into both eyes daily.  (Patient not taking: Reported on 08/17/2020)    . Lutein 40 MG CAPS Take 40 mg by mouth daily.  (Patient not taking: Reported on 06/25/2020)    . Lutein-Bilberry (BILBERRY PLUS LUTEIN) 02-999 MCG-MG CAPS Take 1 capsule by mouth daily.  (Patient not taking: Reported on 06/25/2020)    . Ospemifene (OSPHENA) 60 MG TABS Take 1 tablet by mouth daily. (Patient not taking: Reported on 06/25/2020) 30 tablet 2  . pantoprazole (PROTONIX) 40 MG tablet Take 1 tablet (40 mg total) by mouth as needed. (Patient not taking: Reported on 06/25/2020) 30 tablet 0  . prednisoLONE acetate (PRED FORTE) 1 % ophthalmic suspension Place 1 drop into both eyes 2 (two) times a day.  (Patient not taking: Reported on 07/16/2020)  1   No current facility-administered medications for this visit.     PHYSICAL EXAMINATION: ECOG PERFORMANCE STATUS: 1 - Symptomatic but completely ambulatory Vitals:   08/17/20 1055  BP: (!) 106/53  Pulse: (!) 52  Resp: 18  Temp: (!) 97.4 F (36.3 C)   Filed Weights   08/17/20 1055  Weight: 167 lb 14.4 oz (76.2 kg)    Physical Exam Constitutional:      General: She is not in acute  distress. HENT:     Head: Normocephalic and atraumatic.  Eyes:     General: No scleral icterus. Cardiovascular:     Rate and Rhythm: Normal rate and regular rhythm.     Heart sounds: Normal heart sounds.  Pulmonary:  Effort: Pulmonary effort is normal. No respiratory distress.     Breath sounds: No wheezing.  Abdominal:     General: Bowel sounds are normal. There is no distension.     Palpations: Abdomen is soft.     Comments: + Colostomy bag   Musculoskeletal:        General: No deformity. Normal range of motion.     Cervical back: Normal range of motion and neck supple.  Skin:    General: Skin is warm and dry.     Findings: No erythema or rash.  Neurological:     Mental Status: She is alert and oriented to person, place, and time. Mental status is at baseline.     Cranial Nerves: No cranial nerve deficit.     Coordination: Coordination normal.  Psychiatric:        Mood and Affect: Mood normal.    CMP Latest Ref Rng & Units 08/17/2020  Glucose 70 - 99 mg/dL 80  BUN 8 - 23 mg/dL 11  Creatinine 0.44 - 1.00 mg/dL 0.92  Sodium 135 - 145 mmol/L 139  Potassium 3.5 - 5.1 mmol/L 3.7  Chloride 98 - 111 mmol/L 101  CO2 22 - 32 mmol/L 29  Calcium 8.9 - 10.3 mg/dL 9.4  Total Protein 6.5 - 8.1 g/dL 7.1  Total Bilirubin 0.3 - 1.2 mg/dL 0.5  Alkaline Phos 38 - 126 U/L 38  AST 15 - 41 U/L 30  ALT 0 - 44 U/L 16   CBC Latest Ref Rng & Units 08/17/2020  WBC 4.0 - 10.5 K/uL 3.7(L)  Hemoglobin 12.0 - 15.0 g/dL 11.6(L)  Hematocrit 36 - 46 % 36.1  Platelets 150 - 400 K/uL 223    LABORATORY DATA:  I have reviewed the data as listed Lab Results  Component Value Date   WBC 3.7 (L) 08/17/2020   HGB 11.6 (L) 08/17/2020   HCT 36.1 08/17/2020   MCV 92.8 08/17/2020   PLT 223 08/17/2020   Recent Labs    03/19/20 0826 03/19/20 0826 06/23/20 1124 06/23/20 1124 07/16/20 0802 07/23/20 1345 08/02/20 0823 08/10/20 0959 08/17/20 1007  NA 142   < > 140   < > 140   < > 141 140 139   K 4.1   < > 3.7   < > 3.2*   < > 3.5 3.5 3.7  CL 102   < > 100   < > 101   < > 102 101 101  CO2 31   < > 30   < > 29   < > _0 GLUCOSE 95   < > 114*   < > 114*   < > 96 83 80  BUN 12   < > 10   < > 9   < > 8 7* 11  CREATININE 1.01*   < > 0.97   < > 0.87   < > 0.93 0.94 0.92  CALCIUM 9.9   < > 9.1   < > 9.2   < > 9.3 9.3 9.4  GFRNONAA 56*   < > 58*   < > >60   < > >60 >60 >60  GFRAA >60  --  >60  --  >60  --   --   --   --   PROT 7.5   < > 7.0   < > 6.9   < > 6.9 7.1 7.1  ALBUMIN 4.4   < > 4.0   < > 3.6   < >  3.7 3.8 4.0  AST 25   < > 25   < > 24   < > _0 ALT 16   < > 15   < > 14   < > _1 ALKPHOS 37*   < > 41   < > 41   < > 47 44 38  BILITOT 0.8   < > 0.4   < > 0.5   < > 0.7 0.5 0.5   < > = values in this interval not displayed.   Iron/TIBC/Ferritin/ %Sat    Component Value Date/Time   IRON 40 06/23/2020 1124   TIBC 246 (L) 06/23/2020 1124   FERRITIN 338 (H) 06/23/2020 1124   IRONPCTSAT 16 06/23/2020 1124     RADIOGRAPHIC STUDIES: I have personally reviewed the radiological images as listed and agreed with the findings in the report. CT CHEST ABDOMEN PELVIS W CONTRAST  Result Date: 06/25/2020 CLINICAL DATA:  History of rectal cancer and vaginal bleeding with elevated CEA EXAM: CT CHEST, ABDOMEN, AND PELVIS WITH CONTRAST TECHNIQUE: Multidetector CT imaging of the chest, abdomen and pelvis was performed following the standard protocol during bolus administration of intravenous contrast. CONTRAST:  66m OMNIPAQUE IOHEXOL 300 MG/ML  SOLN COMPARISON:  Feb 25, 2020 FINDINGS: CT CHEST FINDINGS Cardiovascular: RIGHT-sided Port-A-Cath terminates in the RIGHT atrium as before. Heart size is stable without pericardial effusion. Engorgement of central pulmonary vasculature slightly greater than 3 cm. Mediastinum/Nodes: Thoracic inlet structures are normal. No axillary lymphadenopathy. No mediastinal lymphadenopathy. No hilar lymphadenopathy. Esophagus grossly normal.  Lungs/Pleura: No suspicious pulmonary mass or nodule. Airways are patent. Tiny nodule along the fissure in the RIGHT lower lobe is stable at approximately 2-3 mm. The signs of lingular and RIGHT middle lobe scarring. Musculoskeletal: See below for full musculoskeletal detail. Spinal degenerative changes. No acute process involving the bony thorax. CT ABDOMEN PELVIS FINDINGS Hepatobiliary: No focal, suspicious hepatic lesion. Post cholecystectomy with less biliary duct distension that was evident on the prior study. Tiny hypodensity in the RIGHT hepatic lobe is stable. The portal vein is patent. Pancreas: Pancreas without inflammation or focal lesion. Mild ductal distension unchanged. Spleen: Spleen normal in size and contour without focal lesion. Adrenals/Urinary Tract: Adrenal glands are normal. Cysts of the bilateral kidneys are unchanged. No hydronephrosis. Urinary bladder under distended. Stomach/Bowel: Signs of APR with LEFT lower quadrant colostomy. Fluid density along the RIGHT lateral margin of the colon in the ostomy has increased slowly over time. To a lesser extent this is true along the LEFT lateral margin. There is no stranding about the colon as it passes through the abdominal wall. Greatest thickness of fluid at the level of the ostomy approximately 2 cm, previously approximately 1.4 cm. Vascular/Lymphatic: Calcified and noncalcified atheromatous plaque of the abdominal aorta. There is no gastrohepatic or hepatoduodenal ligament lymphadenopathy. No retroperitoneal or mesenteric lymphadenopathy. No pelvic sidewall lymphadenopathy. Reproductive: Increasing fullness about the vaginal introitus at the pelvic floor with masslike appearance suggested in this location best seen on image 118 of series 2 and image 73 of series 4 this is of uncertain significance but suspicious certainly more pronounced than in F03/09/20and more pronounced than on the prior exam Other: No peritoneal nodularity or  ascites. Musculoskeletal: No acute bone process. No destructive bone finding. Spinal degenerative changes. IMPRESSION: 1. More masslike appearance of the vaginal introitus and lower vagina. The possibility of disease in this area is considered, this is likely amenable to direct clinical inspection  for signs of mass. 2. Fluid density about the colon slightly increasing over time. The possibility disease in this area is not excluded as there is a lack of significant colonic inflammation in this location. Focused ultrasound and aspiration could be performed though findings about the vagina are more suspicious particularly given the report of close vaginal margins and the irregular appearance on CT that is definitely changed since the baseline CT evaluation of February of 2020 3. No findings to suggest metastatic disease to the chest. 4. Engorgement of central pulmonary vasculature slightly greater than 3 cm, raising the question of pulmonary arterial hypertension. 5. Stable biliary and pancreatic ductal distension. 6. Aortic atherosclerosis. These results will be called to the ordering clinician or representative by the Radiologist Assistant, and communication documented in the PACS or [employer]. Aortic Atherosclerosis (ICD10-I70.0). Electronically Signed   By: Zetta Bills M.D.   On: 06/25/2020 15:42   NM PET Image Restag (PS) Skull Base To Thigh  Result Date: 07/20/2020 CLINICAL DATA:  Subsequent treatment strategy for rectal cancer. Chemotherapy last Friday. Radiation therapy most recent 1 year ago. Vaginal biopsy 1 week ago, demonstrating involvement by colorectal adenocarcinoma. History of rectovaginal fistula. COVID vaccine x2 most recent 12/23/2019. EXAM: NUCLEAR MEDICINE PET SKULL BASE TO THIGH TECHNIQUE: 8.8 mCi F-18 FDG was injected intravenously. Full-ring PET imaging was performed from the skull base to thigh after the radiotracer. CT data was obtained and used for attenuation correction and  anatomic localization. Fasting blood glucose: 86 mg/dl COMPARISON:  CTs 06/25/2020. FINDINGS: Mediastinal blood pool activity: SUV max 2.1 Liver activity: SUV max NA NECK: No areas of abnormal hypermetabolism. Incidental CT findings: No cervical adenopathy. CHEST: No pulmonary parenchymal or thoracic nodal hypermetabolism. Incidental CT findings: Deferred to recent diagnostic CT. Right Port-A-Cath tip mid to low right atrium. Aortic and coronary artery atherosclerosis. ABDOMEN/PELVIS: No abdominopelvic nodal hypermetabolism. There is hypermetabolism at the descending colon along its peripheral course through the ostomy (status post abdominal perineal resection). Example at a S.U.V. max of 5.0 on 184/3. This is separate from the right-sided fluid described on the prior diagnostic CT, which is relatively similar at maximally 2.1 cm on 181/3. Circumferential wall thickening and hypermetabolism within the vaginal introitus. Example at a S.U.V. max of 12.3 on 236/3. Incidental CT findings: Deferred to recent diagnostic CT. Low-density bilateral renal lesions are likely cysts. Right abdominal wall hernia repair. Hysterectomy. Scattered colonic diverticula. SKELETON: No abnormal marrow activity. Incidental CT findings: Osteopenia IMPRESSION: 1. Circumferential wall thickening and hypermetabolism involving the vaginal introitus, consistent with the recent biopsy results of metastatic colorectal carcinoma. 2. Status post abdominal perineal resection with left abdominal ostomy. Hypermetabolism within the colon within the ostomy could be physiologic. This should be amenable to physical exam correlation to exclude residual/recurrent disease. 3. No other evidence of hypermetabolic metastasis. 4. Incidental findings, including: Aortic atherosclerosis (ICD10-I70.0) and emphysema (ICD10-J43.9). Electronically Signed   By: Abigail Miyamoto M.D.   On: 07/20/2020 14:15    CT CHEST ABDOMEN PELVIS W CONTRAST  Result Date:  06/25/2020 CLINICAL DATA:  History of rectal cancer and vaginal bleeding with elevated CEA EXAM: CT CHEST, ABDOMEN, AND PELVIS WITH CONTRAST TECHNIQUE: Multidetector CT imaging of the chest, abdomen and pelvis was performed following the standard protocol during bolus administration of intravenous contrast. CONTRAST:  12m OMNIPAQUE IOHEXOL 300 MG/ML  SOLN COMPARISON:  Feb 25, 2020 FINDINGS: CT CHEST FINDINGS Cardiovascular: RIGHT-sided Port-A-Cath terminates in the RIGHT atrium as before. Heart size is stable without pericardial effusion. Engorgement  of central pulmonary vasculature slightly greater than 3 cm. Mediastinum/Nodes: Thoracic inlet structures are normal. No axillary lymphadenopathy. No mediastinal lymphadenopathy. No hilar lymphadenopathy. Esophagus grossly normal. Lungs/Pleura: No suspicious pulmonary mass or nodule. Airways are patent. Tiny nodule along the fissure in the RIGHT lower lobe is stable at approximately 2-3 mm. The signs of lingular and RIGHT middle lobe scarring. Musculoskeletal: See below for full musculoskeletal detail. Spinal degenerative changes. No acute process involving the bony thorax. CT ABDOMEN PELVIS FINDINGS Hepatobiliary: No focal, suspicious hepatic lesion. Post cholecystectomy with less biliary duct distension that was evident on the prior study. Tiny hypodensity in the RIGHT hepatic lobe is stable. The portal vein is patent. Pancreas: Pancreas without inflammation or focal lesion. Mild ductal distension unchanged. Spleen: Spleen normal in size and contour without focal lesion. Adrenals/Urinary Tract: Adrenal glands are normal. Cysts of the bilateral kidneys are unchanged. No hydronephrosis. Urinary bladder under distended. Stomach/Bowel: Signs of APR with LEFT lower quadrant colostomy. Fluid density along the RIGHT lateral margin of the colon in the ostomy has increased slowly over time. To a lesser extent this is true along the LEFT lateral margin. There is no stranding  about the colon as it passes through the abdominal wall. Greatest thickness of fluid at the level of the ostomy approximately 2 cm, previously approximately 1.4 cm. Vascular/Lymphatic: Calcified and noncalcified atheromatous plaque of the abdominal aorta. There is no gastrohepatic or hepatoduodenal ligament lymphadenopathy. No retroperitoneal or mesenteric lymphadenopathy. No pelvic sidewall lymphadenopathy. Reproductive: Increasing fullness about the vaginal introitus at the pelvic floor with masslike appearance suggested in this location best seen on image 118 of series 2 and image 73 of series 4 this is of uncertain significance but suspicious certainly more pronounced than in 12/11/2018 and more pronounced than on the prior exam Other: No peritoneal nodularity or ascites. Musculoskeletal: No acute bone process. No destructive bone finding. Spinal degenerative changes. IMPRESSION: 1. More masslike appearance of the vaginal introitus and lower vagina. The possibility of disease in this area is considered, this is likely amenable to direct clinical inspection for signs of mass. 2. Fluid density about the colon slightly increasing over time. The possibility disease in this area is not excluded as there is a lack of significant colonic inflammation in this location. Focused ultrasound and aspiration could be performed though findings about the vagina are more suspicious particularly given the report of close vaginal margins and the irregular appearance on CT that is definitely changed since the baseline CT evaluation of 12/11/2018 3. No findings to suggest metastatic disease to the chest. 4. Engorgement of central pulmonary vasculature slightly greater than 3 cm, raising the question of pulmonary arterial hypertension. 5. Stable biliary and pancreatic ductal distension. 6. Aortic atherosclerosis. These results will be called to the ordering clinician or representative by the Radiologist Assistant, and  communication documented in the PACS or [employer]. Aortic Atherosclerosis (ICD10-I70.0). Electronically Signed   By: Zetta Bills M.D.   On: 06/25/2020 15:42   NM PET Image Restag (PS) Skull Base To Thigh  Result Date: 07/20/2020 CLINICAL DATA:  Subsequent treatment strategy for rectal cancer. Chemotherapy last Friday. Radiation therapy most recent 1 year ago. Vaginal biopsy 1 week ago, demonstrating involvement by colorectal adenocarcinoma. History of rectovaginal fistula. COVID vaccine x2 most recent 12/23/2019. EXAM: NUCLEAR MEDICINE PET SKULL BASE TO THIGH TECHNIQUE: 8.8 mCi F-18 FDG was injected intravenously. Full-ring PET imaging was performed from the skull base to thigh after the radiotracer. CT data  was obtained and used for attenuation correction and anatomic localization. Fasting blood glucose: 86 mg/dl COMPARISON:  CTs 06/25/2020. FINDINGS: Mediastinal blood pool activity: SUV max 2.1 Liver activity: SUV max NA NECK: No areas of abnormal hypermetabolism. Incidental CT findings: No cervical adenopathy. CHEST: No pulmonary parenchymal or thoracic nodal hypermetabolism. Incidental CT findings: Deferred to recent diagnostic CT. Right Port-A-Cath tip mid to low right atrium. Aortic and coronary artery atherosclerosis. ABDOMEN/PELVIS: No abdominopelvic nodal hypermetabolism. There is hypermetabolism at the descending colon along its peripheral course through the ostomy (status post abdominal perineal resection). Example at a S.U.V. max of 5.0 on 184/3. This is separate from the right-sided fluid described on the prior diagnostic CT, which is relatively similar at maximally 2.1 cm on 181/3. Circumferential wall thickening and hypermetabolism within the vaginal introitus. Example at a S.U.V. max of 12.3 on 236/3. Incidental CT findings: Deferred to recent diagnostic CT. Low-density bilateral renal lesions are likely cysts. Right abdominal wall hernia repair. Hysterectomy. Scattered colonic  diverticula. SKELETON: No abnormal marrow activity. Incidental CT findings: Osteopenia IMPRESSION: 1. Circumferential wall thickening and hypermetabolism involving the vaginal introitus, consistent with the recent biopsy results of metastatic colorectal carcinoma. 2. Status post abdominal perineal resection with left abdominal ostomy. Hypermetabolism within the colon within the ostomy could be physiologic. This should be amenable to physical exam correlation to exclude residual/recurrent disease. 3. No other evidence of hypermetabolic metastasis. 4. Incidental findings, including: Aortic atherosclerosis (ICD10-I70.0) and emphysema (ICD10-J43.9). Electronically Signed   By: Abigail Miyamoto M.D.   On: 07/20/2020 14:15    ASSESSMENT & PLAN:  1. Encounter for antineoplastic chemotherapy   2. Rectal cancer ([place])   3. Vaginal bleeding   Cancer Staging Rectal cancer [facility]) Staging form: Colon and Rectum, AJCC 8th Edition - Clinical stage from 01/23/2019: Stage IIIC (cT4b, cN1a, cM0) - Signed by Earlie Server, MD on 01/23/2019 - Pathologic stage from 10/06/2019: Stage IIC (ypT4b, pN0, cM0) - Signed by Earlie Server, MD on 10/06/2019  #History of stage IIIC Rectal cancer, s/p TNT, followed by 09/17/19 APR/posterior vaginectomy/TAH/BSO/VY-flap pT4b pN0 with close vaginal margin 0.2 mm.  Uterus and ovaries negative for malignancy. Developed local recurrence.  Labs reviewed and discussed with patient. Chemotherapy-induced neutropenia has resolved. Recommend patient to start Xeloda 1500 mg twice daily on days of radiation. Repeat blood work in 1 week  Vaginal spotting due to malignancy, improving.  Hemoglobin stable.  #History of acute pulmonary embolism, bilateral DVT, unprovoked, continue Eliquis 2.5 mg twice daily.  Follow-up 1 week for reevaluation.  Earlie Server, MD, PhD

## 2020-08-17 NOTE — Progress Notes (Signed)
Pt here for follow up. No new concerns voiced.   

## 2020-08-18 ENCOUNTER — Ambulatory Visit
Admission: RE | Admit: 2020-08-18 | Discharge: 2020-08-18 | Disposition: A | Discharge status: Home / Self Care | Payer: Medicare Other | Source: Ambulatory Visit | Attending: Radiation Oncology | Admitting: Radiation Oncology

## 2020-08-18 DIAGNOSIS — C20 Malignant neoplasm of rectum: Secondary | ICD-10-CM | POA: Diagnosis not present

## 2020-08-18 DIAGNOSIS — Z510 Encounter for antineoplastic radiation therapy: Secondary | ICD-10-CM | POA: Diagnosis not present

## 2020-08-19 ENCOUNTER — Ambulatory Visit
Admission: RE | Admit: 2020-08-19 | Discharge: 2020-08-19 | Disposition: A | Discharge status: Home / Self Care | Payer: Medicare Other | Source: Ambulatory Visit | Attending: Radiation Oncology | Admitting: Radiation Oncology

## 2020-08-19 DIAGNOSIS — Z510 Encounter for antineoplastic radiation therapy: Secondary | ICD-10-CM | POA: Diagnosis not present

## 2020-08-19 DIAGNOSIS — C20 Malignant neoplasm of rectum: Secondary | ICD-10-CM | POA: Diagnosis not present

## 2020-08-20 ENCOUNTER — Ambulatory Visit
Admission: RE | Admit: 2020-08-20 | Discharge: 2020-08-20 | Disposition: A | Discharge status: Home / Self Care | Payer: Medicare Other | Source: Ambulatory Visit | Attending: Radiation Oncology | Admitting: Radiation Oncology

## 2020-08-20 DIAGNOSIS — Z510 Encounter for antineoplastic radiation therapy: Secondary | ICD-10-CM | POA: Diagnosis not present

## 2020-08-20 DIAGNOSIS — C20 Malignant neoplasm of rectum: Secondary | ICD-10-CM | POA: Diagnosis not present

## 2020-08-23 ENCOUNTER — Ambulatory Visit
Admission: RE | Admit: 2020-08-23 | Discharge: 2020-08-23 | Disposition: A | Discharge status: Home / Self Care | Payer: Medicare Other | Source: Ambulatory Visit | Attending: Radiation Oncology | Admitting: Radiation Oncology

## 2020-08-23 ENCOUNTER — Inpatient Hospital Stay: Payer: Medicare Other

## 2020-08-23 DIAGNOSIS — Z86711 Personal history of pulmonary embolism: Secondary | ICD-10-CM | POA: Diagnosis not present

## 2020-08-23 DIAGNOSIS — C20 Malignant neoplasm of rectum: Secondary | ICD-10-CM | POA: Diagnosis not present

## 2020-08-23 DIAGNOSIS — Z510 Encounter for antineoplastic radiation therapy: Secondary | ICD-10-CM | POA: Diagnosis not present

## 2020-08-23 DIAGNOSIS — Z5111 Encounter for antineoplastic chemotherapy: Secondary | ICD-10-CM | POA: Diagnosis not present

## 2020-08-23 DIAGNOSIS — K219 Gastro-esophageal reflux disease without esophagitis: Secondary | ICD-10-CM | POA: Diagnosis not present

## 2020-08-23 DIAGNOSIS — I2510 Atherosclerotic heart disease of native coronary artery without angina pectoris: Secondary | ICD-10-CM | POA: Diagnosis not present

## 2020-08-23 DIAGNOSIS — K5900 Constipation, unspecified: Secondary | ICD-10-CM | POA: Diagnosis not present

## 2020-08-23 DIAGNOSIS — R634 Abnormal weight loss: Secondary | ICD-10-CM | POA: Diagnosis not present

## 2020-08-23 DIAGNOSIS — E7800 Pure hypercholesterolemia, unspecified: Secondary | ICD-10-CM | POA: Diagnosis not present

## 2020-08-23 DIAGNOSIS — M129 Arthropathy, unspecified: Secondary | ICD-10-CM | POA: Diagnosis not present

## 2020-08-23 DIAGNOSIS — I700 Atherosclerosis of aorta: Secondary | ICD-10-CM | POA: Diagnosis not present

## 2020-08-23 DIAGNOSIS — Z86718 Personal history of other venous thrombosis and embolism: Secondary | ICD-10-CM | POA: Diagnosis not present

## 2020-08-23 DIAGNOSIS — I10 Essential (primary) hypertension: Secondary | ICD-10-CM | POA: Diagnosis not present

## 2020-08-23 DIAGNOSIS — Z95828 Presence of other vascular implants and grafts: Secondary | ICD-10-CM

## 2020-08-23 DIAGNOSIS — Z79899 Other long term (current) drug therapy: Secondary | ICD-10-CM | POA: Diagnosis not present

## 2020-08-23 DIAGNOSIS — Z87891 Personal history of nicotine dependence: Secondary | ICD-10-CM | POA: Diagnosis not present

## 2020-08-23 DIAGNOSIS — C775 Secondary and unspecified malignant neoplasm of intrapelvic lymph nodes: Secondary | ICD-10-CM | POA: Diagnosis not present

## 2020-08-23 MED ORDER — SODIUM CHLORIDE 0.9% FLUSH
10.0000 mL | INTRAVENOUS | Status: DC | PRN
  Administered 2020-08-23: 10 mL via INTRAVENOUS
  Filled 2020-08-23: qty 10

## 2020-08-23 MED ORDER — HEPARIN SOD (PORK) LOCK FLUSH 100 UNIT/ML IV SOLN
500.0000 [IU] | Freq: Once | INTRAVENOUS | Status: AC
  Administered 2020-08-23: 500 [IU] via INTRAVENOUS
  Filled 2020-08-23: qty 5

## 2020-08-24 ENCOUNTER — Inpatient Hospital Stay: Payer: Medicare Other

## 2020-08-24 ENCOUNTER — Inpatient Hospital Stay (HOSPITAL_BASED_OUTPATIENT_CLINIC_OR_DEPARTMENT_OTHER): Payer: Medicare Other | Admitting: Oncology

## 2020-08-24 ENCOUNTER — Encounter: Payer: Self-pay | Admitting: Oncology

## 2020-08-24 ENCOUNTER — Other Ambulatory Visit: Payer: Self-pay

## 2020-08-24 ENCOUNTER — Ambulatory Visit
Admission: RE | Admit: 2020-08-24 | Discharge: 2020-08-24 | Disposition: A | Discharge status: Home / Self Care | Payer: Medicare Other | Source: Ambulatory Visit | Attending: Radiation Oncology | Admitting: Radiation Oncology

## 2020-08-24 VITALS — BP 111/71 | HR 56 | Temp 97.0°F | Resp 18 | Wt 167.0 lb

## 2020-08-24 DIAGNOSIS — Z86711 Personal history of pulmonary embolism: Secondary | ICD-10-CM | POA: Diagnosis not present

## 2020-08-24 DIAGNOSIS — Z510 Encounter for antineoplastic radiation therapy: Secondary | ICD-10-CM | POA: Diagnosis not present

## 2020-08-24 DIAGNOSIS — C20 Malignant neoplasm of rectum: Secondary | ICD-10-CM | POA: Diagnosis not present

## 2020-08-24 DIAGNOSIS — C775 Secondary and unspecified malignant neoplasm of intrapelvic lymph nodes: Secondary | ICD-10-CM | POA: Diagnosis not present

## 2020-08-24 DIAGNOSIS — Z5111 Encounter for antineoplastic chemotherapy: Secondary | ICD-10-CM

## 2020-08-24 DIAGNOSIS — N939 Abnormal uterine and vaginal bleeding, unspecified: Secondary | ICD-10-CM

## 2020-08-24 DIAGNOSIS — K219 Gastro-esophageal reflux disease without esophagitis: Secondary | ICD-10-CM | POA: Diagnosis not present

## 2020-08-24 DIAGNOSIS — R634 Abnormal weight loss: Secondary | ICD-10-CM | POA: Diagnosis not present

## 2020-08-24 DIAGNOSIS — E7800 Pure hypercholesterolemia, unspecified: Secondary | ICD-10-CM | POA: Diagnosis not present

## 2020-08-24 DIAGNOSIS — M129 Arthropathy, unspecified: Secondary | ICD-10-CM | POA: Diagnosis not present

## 2020-08-24 DIAGNOSIS — Z86718 Personal history of other venous thrombosis and embolism: Secondary | ICD-10-CM | POA: Diagnosis not present

## 2020-08-24 DIAGNOSIS — I700 Atherosclerosis of aorta: Secondary | ICD-10-CM | POA: Diagnosis not present

## 2020-08-24 DIAGNOSIS — I10 Essential (primary) hypertension: Secondary | ICD-10-CM | POA: Diagnosis not present

## 2020-08-24 DIAGNOSIS — Z79899 Other long term (current) drug therapy: Secondary | ICD-10-CM | POA: Diagnosis not present

## 2020-08-24 DIAGNOSIS — K5900 Constipation, unspecified: Secondary | ICD-10-CM | POA: Diagnosis not present

## 2020-08-24 DIAGNOSIS — I2510 Atherosclerotic heart disease of native coronary artery without angina pectoris: Secondary | ICD-10-CM | POA: Diagnosis not present

## 2020-08-24 DIAGNOSIS — Z87891 Personal history of nicotine dependence: Secondary | ICD-10-CM | POA: Diagnosis not present

## 2020-08-24 LAB — CBC WITH DIFFERENTIAL/PLATELET
Abs Immature Granulocytes: 0 10*3/uL (ref 0.00–0.07)
Basophils Absolute: 0 10*3/uL (ref 0.0–0.1)
Basophils Relative: 1 %
Eosinophils Absolute: 0.1 10*3/uL (ref 0.0–0.5)
Eosinophils Relative: 4 %
HCT: 36.8 % (ref 36.0–46.0)
Hemoglobin: 12 g/dL (ref 12.0–15.0)
Immature Granulocytes: 0 %
Lymphocytes Relative: 25 %
Lymphs Abs: 0.9 10*3/uL (ref 0.7–4.0)
MCH: 30.5 pg (ref 26.0–34.0)
MCHC: 32.6 g/dL (ref 30.0–36.0)
MCV: 93.6 fL (ref 80.0–100.0)
Monocytes Absolute: 0.3 10*3/uL (ref 0.1–1.0)
Monocytes Relative: 10 %
Neutro Abs: 2.1 10*3/uL (ref 1.7–7.7)
Neutrophils Relative %: 60 %
Platelets: 220 10*3/uL (ref 150–400)
RBC: 3.93 MIL/uL (ref 3.87–5.11)
RDW: 15.1 % (ref 11.5–15.5)
WBC: 3.5 10*3/uL — ABNORMAL LOW (ref 4.0–10.5)
nRBC: 0 % (ref 0.0–0.2)

## 2020-08-24 LAB — COMPREHENSIVE METABOLIC PANEL
ALT: 14 U/L (ref 0–44)
AST: 24 U/L (ref 15–41)
Albumin: 4.2 g/dL (ref 3.5–5.0)
Alkaline Phosphatase: 38 U/L (ref 38–126)
Anion gap: 9 (ref 5–15)
BUN: 12 mg/dL (ref 8–23)
CO2: 31 mmol/L (ref 22–32)
Calcium: 9.5 mg/dL (ref 8.9–10.3)
Chloride: 99 mmol/L (ref 98–111)
Creatinine, Ser: 0.97 mg/dL (ref 0.44–1.00)
GFR, Estimated: >60 mL/min (ref >60)
Glucose, Bld: 98 mg/dL (ref 70–99)
Potassium: 4.1 mmol/L (ref 3.5–5.1)
Sodium: 139 mmol/L (ref 135–145)
Total Bilirubin: 0.4 mg/dL (ref 0.3–1.2)
Total Protein: 7.4 g/dL (ref 6.5–8.1)

## 2020-08-24 NOTE — Progress Notes (Signed)
Pt here for follow up. No new concerns voiced.   

## 2020-08-24 NOTE — Progress Notes (Signed)
Hematology/Oncology progress note [facility] Telephone:([phone] [phone]   Patient Care Team: Kirk Ruths, MD as PCP - General (Internal Medicine) Clent Jacks, RN as Registered Nurse Earlie Server, MD as Consulting Physician (Hematology and Oncology)  REFERRING PROVIDER: Dr.Secord CHIEF COMPLAINTS/REASON FOR VISIT:  Follow up for rectal cancer  HISTORY OF PRESENTING ILLNESS:  Jacqueline Yoder is a  74 y.o.  female with PMH listed below who was referred to me for evaluation of endometrial cancer.   Patient initially presented with complaints of postmenopausal bleeding on 08/16/2018.  History of was menopausal vaginal bleeding in 2016 which resulted in cervical polypectomy.  Pathology 02/04/2015 showed cervical polyp, consistent with benign endometrial polyp.  [name] after polypectomy due to anxiety associated with pelvic exams.  pelvic exam on 08/16/2018 reviewed cervical abnormality and from enlarged uterus. Seen by Dr. Marcelline Mates on 10/29/2018.  Endometrial biopsy and a Pap smear was performed. 10/29/2018 Pap smear showed adenocarcinoma, favor endometrial origin. 10/29/2018 endometrial biopsy showed endometrioid carcinoma, FIGO grade 1.  10/29/2018- TA & TV Ultrasound revealed: Anteverted uterus measuring 8.7 x 5.6 x 6.4 cm without evidence of focal masses.  The endometrium measuring 24.1 mm (thickened) and heterogeneous.  Right and left ovaries not visualized.  No adnexal masses identified.  No free fluid in cul-de-sac.  Patient was seen by Dr. Theora Gianotti in clinic on 11/13/2018.  Cervical exam reveals 2 cm exophytic irregular mass consistent with malignancy.   11/19/2018 CT chest abdomen pelvis with contrast showed thickened endometrium with some irregularity compatible with the provided diagnosis of endometrial malignancy.  There is a mildly prominent left inguinal node 1.4 cm.  Patient was seen by Dr. Fransisca Connors on 11/20/2018 and left groin  lymph node biopsy was recommended.  11/26/2018 patient underwent left inguinal lymph node biopsy. Pathology showed metastatic adenocarcinoma consistent with colorectal origin.  CDX 2+.  Case was discussed on tumor board.  Recommend colonoscopy for further evaluation.  Patient reports significant weight loss 30 pounds over the last year.  Chronic vaginal spotting. Change of bowel habits the past few months.  More constipated.  Family history positive for brother who has colon cancer prostate cancer.  patient has underwent colonoscopy on 12/03/2018 which reviewed a nonobstructing large mass in the rectum.  Also chronic fistula.  Mass was not circumferential.  This was biopsied with a cold forceps for histology.  Pathology came back hyperplastic polyp negative for dysplasia and malignancy. [name], patient underwent flex sigmoidoscopy on 12/06/2018 with rebiopsy of the rectal mass. This time biopsy results came back positive for invasive colorectal adenocarcinoma, moderately differentiated. Immunotherapy for nearly mismatch repair protein (MMR ) was performed.  There is no loss of MMR expression.  low probability of MSI high.   # Seen by [name] for evaluation of resectability for rectal cancer. In addition, she also had a second opinion with [facility] where her endometrial biopsy pathology was changed to  adenocarcinoma, consistent with colorectal primary.   [name]. She has home health that has been assisting with ostomy care  Patient was also evaluated by [facility] oncology.  Recommendation is to proceed with TNT with concurrent chemoradiation followed by neoadjuvant chemotherapy followed by surgical resection. Patient prefers to have treatment done locally with [facility].   # Oncology Treatment:  02/03/2019- 03/19/2019  concurrent Xeloda and radiation.  Xeloda dose 872m /m2 BID - rounded to 16549mBID- on days of  radiation. 04/09/2019, started  on FOLFOX with bolus 5-FU omitted.  07/16/2019 finished 8 cycles of FOLFOX.  # Patient has underwent 09/17/19 APR/posterior vaginectomy/TAH/BSO/VY-flap pT4b pN0 with close vaginal margin 0.2 mm.  Uterus and ovaries negative for malignancy. Patient reports bilateral lower extremity numbness and tingling, intermittent, left worse than right. She has lost a lot of weight since her APR surgery.   Surveillance plan Recommend history and physical/tumor marker monitoring every 3 to 6 months for the first 2 years after surgery and then every 6 months for a total of 5 years. Chest abdomen pelvis every 6 to 12 months for total 5 years. Colonoscopy 1 year after surgery, if no advanced adenoma, repeat in 3 years and then 5 years.  #Family history with half brother having's history of colon cancer prostate cancer.  Personal history of colorectal cancer.  Patient has not decided if she wants genetic testing.   # vaginal introitus mass biopsied. Pathology is consistent with metastatic colorectal adenocarcinoma  # history of PE( 01/13/2020)  in the bilateral lower extremity DVT (01/13/2020).   She finishes 6 months of anticoagulation with Eliquis 5 mg twice daily. Now switched to Eliquis 2.5 mg twice daily..  # She has now developed recurrent disease. I have discussed with [name]  Dr. Hester Mates and the mass is not resectable. Patient has also had colonoscopy by Dr. Vicente Males yesterday. Normal examination. # 07/16/2020 cycle 1 FOLFIRI  # 07/20/2020 PET scan was done for further evaluation, images are consistent with local recurrence, no distant metastasis. #Discussed with radiation oncology Dr. Baruch Gouty will recommends concurrent chemotherapy and radiation. 08/02/2020-08/16/2020, patient starts radiation.  Xeloda was held due to neutropenia 08/17/2000, started on Xeloda 1500 mg twice daily concurrently with radiation INTERVAL HISTORY Jacqueline Yoder is a 74 y.o. female who has above  history reviewed by me presents for follow-up of rectal cancer. Patient has no new complaints. Patient is taking Eliquis 2.5 mg twice daily Patient is on concurrent chemoradiation with Xeloda 1500 mg twice daily. She tolerates well.  Denies any nausea vomiting diarrhea. Vaginal spotting has stopped No fever or chills  Review of Systems  Constitutional: Negative for appetite change, chills, fatigue, fever and unexpected weight change.  HENT:   Negative for hearing loss and voice change.   Eyes: Negative for eye problems.  Respiratory: Negative for chest tightness and cough.   Cardiovascular: Negative for chest pain.  Gastrointestinal: Negative for abdominal distention, abdominal pain, blood in stool, constipation, diarrhea and nausea.  Endocrine: Negative for hot flashes.  Genitourinary: Negative for difficulty urinating and frequency.   Musculoskeletal: Negative for arthralgias.  Skin: Negative for itching and rash.  Neurological: Negative for extremity weakness and numbness.  Hematological: Negative for adenopathy.  Psychiatric/Behavioral: Negative for confusion.    MEDICAL HISTORY:  Past Medical History:  Diagnosis Date  . Arthritis   . Family history of colon cancer   . GERD (gastroesophageal reflux disease)   . Hypercholesteremia   . Hypertension   . Hypertension   . Lower extremity edema   . Rectal cancer ([place]) 12/2018  . Urinary incontinence     SURGICAL HISTORY: Past Surgical History:  Procedure Laterality Date  . CHOLECYSTECTOMY  1971  . COLONOSCOPY WITH PROPOFOL N/A 12/03/2018   Procedure: COLONOSCOPY WITH PROPOFOL;  Surgeon: Lucilla Lame, MD;  Location: [facility] ENDOSCOPY;  Service: Endoscopy;  Laterality: N/A;  . COLONOSCOPY WITH PROPOFOL N/A 07/15/2020   Procedure: COLONOSCOPY WITH PROPOFOL;  Surgeon: [name], MD;  Location: [facility] ENDOSCOPY;  Service: Gastroenterology;  Laterality:  N/A;  . FLEXIBLE SIGMOIDOSCOPY N/A 12/06/2018   Procedure: FLEXIBLE  SIGMOIDOSCOPY;  Surgeon: [name], MD;  Location: [facility] ENDOSCOPY;  Service: Endoscopy;  Laterality: N/A;  . LAPAROSCOPIC COLOSTOMY  01/06/2019  . PORTACATH PLACEMENT N/A 04/03/2019   Procedure: INSERTION PORT-A-CATH;  Surgeon: Jules Husbands, MD;  Location: [facility] ORS;  Service: General;  Laterality: N/A;    SOCIAL HISTORY: Social History   Socioeconomic History  . Marital status: Married    Spouse name: Not on file  . Number of children: Not on file  . Years of education: Not on file  . Highest education level: Not on file  Occupational History  . Not on file  Tobacco Use  . Smoking status: Former Smoker    Quit date: 12/02/1977    Years since quitting: 42.7  . Smokeless tobacco: Never Used  Vaping Use  . Vaping Use: Never used  Substance and Sexual Activity  . Alcohol use: Never  . Drug use: Never  . Sexual activity: Not Currently    Birth control/protection: None  Other Topics Concern  . Not on file  Social History Narrative   Lives with daughter   Social Determinants of Health   Financial Resource Strain:   . Difficulty of Paying Living Expenses: Not on file  Food Insecurity:   . Worried About Charity fundraiser in the Last Year: Not on file  . Ran Out of Food in the Last Year: Not on file  Transportation Needs:   . Lack of Transportation (Medical): Not on file  . Lack of Transportation (Non-Medical): Not on file  Physical Activity:   . Days of Exercise per Week: Not on file  . Minutes of Exercise per Session: Not on file  Stress:   . Feeling of Stress : Not on file  Social Connections:   . Frequency of Communication with Friends and Family: Not on file  . Frequency of Social Gatherings with Friends and Family: Not on file  . Attends Religious Services: Not on file  . Active Member of Clubs or Organizations: Not on file  . Attends Archivist Meetings: Not on file  . Marital Status: Not on file  Intimate Partner Violence:   . Fear of Current or  Ex-Partner: Not on file  . Emotionally Abused: Not on file  . Physically Abused: Not on file  . Sexually Abused: Not on file    FAMILY HISTORY: Family History  Problem Relation Age of Onset  . Colon cancer Brother 55  . Hypertension Mother   . Stroke Mother   . Kidney failure Father   . Breast cancer Neg Hx   . Ovarian cancer Neg Hx     ALLERGIES:  is allergic to sulfamethoxazole-trimethoprim.  MEDICATIONS:  Current Outpatient Medications  Medication Sig Dispense Refill  . apixaban (ELIQUIS) 2.5 MG TABS tablet Take 1 tablet (2.5 mg total) by mouth 2 (two) times daily. 60 tablet 0  . capecitabine (XELODA) 500 MG tablet Take 3 tablets (1,500 mg total) by mouth 2 (two) times daily after a meal. Take Monday-Friday, only on days of radiation. 150 tablet 0  . chlorhexidine (PERIDEX) 0.12 % solution Use as directed 15 mLs in the mouth or throat 2 (two) times daily. 473 mL 3  . Cholecalciferol (VITAMIN D3) 2000 units capsule Take 2,000 Units by mouth daily.    . diclofenac sodium (VOLTAREN) 1 % GEL Apply 2 g topically 4 (four) times daily as needed (joint pain).   11  .  docusate sodium (COLACE) 100 MG capsule Take 100 mg by mouth 2 (two) times daily.    . fexofenadine (ALLEGRA) 180 MG tablet Take 180 mg by mouth daily.    . fluticasone (FLONASE) 50 MCG/ACT nasal spray Place 1 spray into both nostrils 2 (two) times a day.     . gabapentin (NEURONTIN) 100 MG capsule Take 1 capsule (100 mg total) by mouth at bedtime. 90 capsule 0  . HYDROcodone-acetaminophen (NORCO/VICODIN) 5-325 MG tablet Take 1 tablet by mouth every 4 (four) hours as needed for moderate pain. 8 tablet 0  . ipratropium (ATROVENT) 0.03 % nasal spray     . lidocaine-prilocaine (EMLA) cream Apply 1 application topically as needed. 30 g 6  . loperamide (IMODIUM) 2 MG capsule Take 1 capsule (2 mg total) by mouth See admin instructions. With onset of loose stool, take 86m followed by 213mevery 2 hours,  Maximum: 16 mg/day 120  capsule 1  . magic mouthwash w/lidocaine SOLN Take 5 mLs by mouth 4 (four) times daily as needed for mouth pain. Sig: Swish/Swallow 5-10 ml four times a day as needed 480 mL 3  . Multiple Vitamins-Minerals (ONE-A-DAY WOMENS 50 PLUS PO) Take 1 tablet by mouth daily.     . ondansetron (ZOFRAN) 8 MG tablet Take 1 tablet (8 mg total) by mouth every 8 (eight) hours as needed for nausea, vomiting or refractory nausea / vomiting. 90 tablet 1  . potassium chloride SA (K-DUR) 20 MEQ tablet Take 20 mEq by mouth daily.     . prochlorperazine (COMPAZINE) 10 MG tablet Take 1 tablet (10 mg total) by mouth every 6 (six) hours as needed (NAUSEA). 30 tablet 1  . simvastatin (ZOCOR) 40 MG tablet Take 40 mg by mouth at bedtime.     . triamterene-hydrochlorothiazide (DYAZIDE) 37.5-25 MG capsule Take 1 capsule by mouth daily.     . Verneita Griffesark POWD Take by mouth.  (Patient not taking: Reported on 08/02/2020)    . FIBER ADULT GUMMIES PO Take 1 Dose by mouth daily.  (Patient not taking: Reported on 08/02/2020)    . ketorolac (ACULAR) 0.4 % SOLN Place 1 drop into both eyes daily.  (Patient not taking: Reported on 08/17/2020)    . Lutein 40 MG CAPS Take 40 mg by mouth daily.  (Patient not taking: Reported on 06/25/2020)    . Lutein-Bilberry (BILBERRY PLUS LUTEIN) 02-999 MCG-MG CAPS Take 1 capsule by mouth daily.  (Patient not taking: Reported on 06/25/2020)    . Ospemifene (OSPHENA) 60 MG TABS Take 1 tablet by mouth daily. (Patient not taking: Reported on 06/25/2020) 30 tablet 2  . pantoprazole (PROTONIX) 40 MG tablet Take 1 tablet (40 mg total) by mouth as needed. (Patient not taking: Reported on 06/25/2020) 30 tablet 0  . prednisoLONE acetate (PRED FORTE) 1 % ophthalmic suspension Place 1 drop into both eyes 2 (two) times a day.  (Patient not taking: Reported on 07/16/2020)  1   No current facility-administered medications for this visit.     PHYSICAL EXAMINATION: ECOG PERFORMANCE STATUS: 1 - Symptomatic but completely  ambulatory Vitals:   08/24/20 1109  BP: 111/71  Pulse: (!) 56  Resp: 18  Temp: (!) 97 F (36.1 C)   Filed Weights   08/24/20 1109  Weight: 167 lb (75.8 kg)    Physical Exam Constitutional:      General: She is not in acute distress. HENT:     Head: Normocephalic and atraumatic.  Eyes:     General:  No scleral icterus. Cardiovascular:     Rate and Rhythm: Normal rate and regular rhythm.     Heart sounds: Normal heart sounds.  Pulmonary:     Effort: Pulmonary effort is normal. No respiratory distress.     Breath sounds: No wheezing.  Abdominal:     General: Bowel sounds are normal. There is no distension.     Palpations: Abdomen is soft.     Comments: + Colostomy bag   Musculoskeletal:        General: No deformity. Normal range of motion.     Cervical back: Normal range of motion and neck supple.  Skin:    General: Skin is warm and dry.     Findings: No erythema or rash.  Neurological:     Mental Status: She is alert and oriented to person, place, and time. Mental status is at baseline.     Cranial Nerves: No cranial nerve deficit.     Coordination: Coordination normal.  Psychiatric:        Mood and Affect: Mood normal.    CMP Latest Ref Rng & Units 08/24/2020  Glucose 70 - 99 mg/dL 98  BUN 8 - 23 mg/dL 12  Creatinine 0.44 - 1.00 mg/dL 0.97  Sodium 135 - 145 mmol/L 139  Potassium 3.5 - 5.1 mmol/L 4.1  Chloride 98 - 111 mmol/L 99  CO2 22 - 32 mmol/L 31  Calcium 8.9 - 10.3 mg/dL 9.5  Total Protein 6.5 - 8.1 g/dL 7.4  Total Bilirubin 0.3 - 1.2 mg/dL 0.4  Alkaline Phos 38 - 126 U/L 38  AST 15 - 41 U/L 24  ALT 0 - 44 U/L 14   CBC Latest Ref Rng & Units 08/24/2020  WBC 4.0 - 10.5 K/uL 3.5(L)  Hemoglobin 12.0 - 15.0 g/dL 12.0  Hematocrit 36 - 46 % 36.8  Platelets 150 - 400 K/uL 220    LABORATORY DATA:  I have reviewed the data as listed Lab Results  Component Value Date   WBC 3.5 (L) 08/24/2020   HGB 12.0 08/24/2020   HCT 36.8 08/24/2020   MCV 93.6  08/24/2020   PLT 220 08/24/2020   Recent Labs    03/19/20 0826 03/19/20 0826 06/23/20 1124 06/23/20 1124 07/16/20 0802 07/23/20 1345 08/10/20 0959 08/17/20 1007 08/24/20 1014  NA 142   < > 140   < > 140   < > 140 139 139  K 4.1   < > 3.7   < > 3.2*   < > 3.5 3.7 4.1  CL 102   < > 100   < > 101   < > 101 101 99  CO2 31   < > 30   < > 29   < > _0 GLUCOSE 95   < > 114*   < > 114*   < > 83 80 98  BUN 12   < > 10   < > 9   < > 7* 11 12  CREATININE 1.01*   < > 0.97   < > 0.87   < > 0.94 0.92 0.97  CALCIUM 9.9   < > 9.1   < > 9.2   < > 9.3 9.4 9.5  GFRNONAA 56*   < > 58*   < > >60   < > >60 >60 >60  GFRAA >60  --  >60  --  >60  --   --   --   --   PROT 7.5   < >  7.0   < > 6.9   < > 7.1 7.1 7.4  ALBUMIN 4.4   < > 4.0   < > 3.6   < > 3.8 4.0 4.2  AST 25   < > 25   < > 24   < > _0 ALT 16   < > 15   < > 14   < > _1 ALKPHOS 37*   < > 41   < > 41   < > 44 38 38  BILITOT 0.8   < > 0.4   < > 0.5   < > 0.5 0.5 0.4   < > = values in this interval not displayed.   Iron/TIBC/Ferritin/ %Sat    Component Value Date/Time   IRON 40 06/23/2020 1124   TIBC 246 (L) 06/23/2020 1124   FERRITIN 338 (H) 06/23/2020 1124   IRONPCTSAT 16 06/23/2020 1124     RADIOGRAPHIC STUDIES: I have personally reviewed the radiological images as listed and agreed with the findings in the report. CT CHEST ABDOMEN PELVIS W CONTRAST  Result Date: 06/25/2020 CLINICAL DATA:  History of rectal cancer and vaginal bleeding with elevated CEA EXAM: CT CHEST, ABDOMEN, AND PELVIS WITH CONTRAST TECHNIQUE: Multidetector CT imaging of the chest, abdomen and pelvis was performed following the standard protocol during bolus administration of intravenous contrast. CONTRAST:  9m OMNIPAQUE IOHEXOL 300 MG/ML  SOLN COMPARISON:  Feb 25, 2020 FINDINGS: CT CHEST FINDINGS Cardiovascular: RIGHT-sided Port-A-Cath terminates in the RIGHT atrium as before. Heart size is stable without pericardial effusion. Engorgement of  central pulmonary vasculature slightly greater than 3 cm. Mediastinum/Nodes: Thoracic inlet structures are normal. No axillary lymphadenopathy. No mediastinal lymphadenopathy. No hilar lymphadenopathy. Esophagus grossly normal. Lungs/Pleura: No suspicious pulmonary mass or nodule. Airways are patent. Tiny nodule along the fissure in the RIGHT lower lobe is stable at approximately 2-3 mm. The signs of lingular and RIGHT middle lobe scarring. Musculoskeletal: See below for full musculoskeletal detail. Spinal degenerative changes. No acute process involving the bony thorax. CT ABDOMEN PELVIS FINDINGS Hepatobiliary: No focal, suspicious hepatic lesion. Post cholecystectomy with less biliary duct distension that was evident on the prior study. Tiny hypodensity in the RIGHT hepatic lobe is stable. The portal vein is patent. Pancreas: Pancreas without inflammation or focal lesion. Mild ductal distension unchanged. Spleen: Spleen normal in size and contour without focal lesion. Adrenals/Urinary Tract: Adrenal glands are normal. Cysts of the bilateral kidneys are unchanged. No hydronephrosis. Urinary bladder under distended. Stomach/Bowel: Signs of APR with LEFT lower quadrant colostomy. Fluid density along the RIGHT lateral margin of the colon in the ostomy has increased slowly over time. To a lesser extent this is true along the LEFT lateral margin. There is no stranding about the colon as it passes through the abdominal wall. Greatest thickness of fluid at the level of the ostomy approximately 2 cm, previously approximately 1.4 cm. Vascular/Lymphatic: Calcified and noncalcified atheromatous plaque of the abdominal aorta. There is no gastrohepatic or hepatoduodenal ligament lymphadenopathy. No retroperitoneal or mesenteric lymphadenopathy. No pelvic sidewall lymphadenopathy. Reproductive: Increasing fullness about the vaginal introitus at the pelvic floor with masslike appearance suggested in this location best seen on  image 118 of series 2 and image 73 of series 4 this is of uncertain significance but suspicious certainly more pronounced than in F2020/02/19and more pronounced than on the prior exam Other: No peritoneal nodularity or ascites. Musculoskeletal: No acute bone process. No destructive bone finding. Spinal degenerative  changes. IMPRESSION: 1. More masslike appearance of the vaginal introitus and lower vagina. The possibility of disease in this area is considered, this is likely amenable to direct clinical inspection for signs of mass. 2. Fluid density about the colon slightly increasing over time. The possibility disease in this area is not excluded as there is a lack of significant colonic inflammation in this location. Focused ultrasound and aspiration could be performed though findings about the vagina are more suspicious particularly given the report of close vaginal margins and the irregular appearance on CT that is definitely changed since the baseline CT evaluation of February of 2020 3. No findings to suggest metastatic disease to the chest. 4. Engorgement of central pulmonary vasculature slightly greater than 3 cm, raising the question of pulmonary arterial hypertension. 5. Stable biliary and pancreatic ductal distension. 6. Aortic atherosclerosis. These results will be called to the ordering clinician or representative by the Radiologist Assistant, and communication documented in the PACS or [employer]. Aortic Atherosclerosis (ICD10-I70.0). Electronically Signed   By: Zetta Bills M.D.   On: 06/25/2020 15:42   NM PET Image Restag (PS) Skull Base To Thigh  Result Date: 07/20/2020 CLINICAL DATA:  Subsequent treatment strategy for rectal cancer. Chemotherapy last Friday. Radiation therapy most recent 1 year ago. Vaginal biopsy 1 week ago, demonstrating involvement by colorectal adenocarcinoma. History of rectovaginal fistula. COVID vaccine x2 most recent 12/23/2019. EXAM: NUCLEAR MEDICINE PET  SKULL BASE TO THIGH TECHNIQUE: 8.8 mCi F-18 FDG was injected intravenously. Full-ring PET imaging was performed from the skull base to thigh after the radiotracer. CT data was obtained and used for attenuation correction and anatomic localization. Fasting blood glucose: 86 mg/dl COMPARISON:  CTs 06/25/2020. FINDINGS: Mediastinal blood pool activity: SUV max 2.1 Liver activity: SUV max NA NECK: No areas of abnormal hypermetabolism. Incidental CT findings: No cervical adenopathy. CHEST: No pulmonary parenchymal or thoracic nodal hypermetabolism. Incidental CT findings: Deferred to recent diagnostic CT. Right Port-A-Cath tip mid to low right atrium. Aortic and coronary artery atherosclerosis. ABDOMEN/PELVIS: No abdominopelvic nodal hypermetabolism. There is hypermetabolism at the descending colon along its peripheral course through the ostomy (status post abdominal perineal resection). Example at a S.U.V. max of 5.0 on 184/3. This is separate from the right-sided fluid described on the prior diagnostic CT, which is relatively similar at maximally 2.1 cm on 181/3. Circumferential wall thickening and hypermetabolism within the vaginal introitus. Example at a S.U.V. max of 12.3 on 236/3. Incidental CT findings: Deferred to recent diagnostic CT. Low-density bilateral renal lesions are likely cysts. Right abdominal wall hernia repair. Hysterectomy. Scattered colonic diverticula. SKELETON: No abnormal marrow activity. Incidental CT findings: Osteopenia IMPRESSION: 1. Circumferential wall thickening and hypermetabolism involving the vaginal introitus, consistent with the recent biopsy results of metastatic colorectal carcinoma. 2. Status post abdominal perineal resection with left abdominal ostomy. Hypermetabolism within the colon within the ostomy could be physiologic. This should be amenable to physical exam correlation to exclude residual/recurrent disease. 3. No other evidence of hypermetabolic metastasis. 4. Incidental  findings, including: Aortic atherosclerosis (ICD10-I70.0) and emphysema (ICD10-J43.9). Electronically Signed   By: Abigail Miyamoto M.D.   On: 07/20/2020 14:15    CT CHEST ABDOMEN PELVIS W CONTRAST  Result Date: 06/25/2020 CLINICAL DATA:  History of rectal cancer and vaginal bleeding with elevated CEA EXAM: CT CHEST, ABDOMEN, AND PELVIS WITH CONTRAST TECHNIQUE: Multidetector CT imaging of the chest, abdomen and pelvis was performed following the standard protocol during bolus administration of intravenous contrast. CONTRAST:  57mL OMNIPAQUE IOHEXOL 300  MG/ML  SOLN COMPARISON:  Feb 25, 2020 FINDINGS: CT CHEST FINDINGS Cardiovascular: RIGHT-sided Port-A-Cath terminates in the RIGHT atrium as before. Heart size is stable without pericardial effusion. Engorgement of central pulmonary vasculature slightly greater than 3 cm. Mediastinum/Nodes: Thoracic inlet structures are normal. No axillary lymphadenopathy. No mediastinal lymphadenopathy. No hilar lymphadenopathy. Esophagus grossly normal. Lungs/Pleura: No suspicious pulmonary mass or nodule. Airways are patent. Tiny nodule along the fissure in the RIGHT lower lobe is stable at approximately 2-3 mm. The signs of lingular and RIGHT middle lobe scarring. Musculoskeletal: See below for full musculoskeletal detail. Spinal degenerative changes. No acute process involving the bony thorax. CT ABDOMEN PELVIS FINDINGS Hepatobiliary: No focal, suspicious hepatic lesion. Post cholecystectomy with less biliary duct distension that was evident on the prior study. Tiny hypodensity in the RIGHT hepatic lobe is stable. The portal vein is patent. Pancreas: Pancreas without inflammation or focal lesion. Mild ductal distension unchanged. Spleen: Spleen normal in size and contour without focal lesion. Adrenals/Urinary Tract: Adrenal glands are normal. Cysts of the bilateral kidneys are unchanged. No hydronephrosis. Urinary bladder under distended. Stomach/Bowel: Signs of APR with LEFT  lower quadrant colostomy. Fluid density along the RIGHT lateral margin of the colon in the ostomy has increased slowly over time. To a lesser extent this is true along the LEFT lateral margin. There is no stranding about the colon as it passes through the abdominal wall. Greatest thickness of fluid at the level of the ostomy approximately 2 cm, previously approximately 1.4 cm. Vascular/Lymphatic: Calcified and noncalcified atheromatous plaque of the abdominal aorta. There is no gastrohepatic or hepatoduodenal ligament lymphadenopathy. No retroperitoneal or mesenteric lymphadenopathy. No pelvic sidewall lymphadenopathy. Reproductive: Increasing fullness about the vaginal introitus at the pelvic floor with masslike appearance suggested in this location best seen on image 118 of series 2 and image 73 of series 4 this is of uncertain significance but suspicious certainly more pronounced than in 05-Dec-2018 and more pronounced than on the prior exam Other: No peritoneal nodularity or ascites. Musculoskeletal: No acute bone process. No destructive bone finding. Spinal degenerative changes. IMPRESSION: 1. More masslike appearance of the vaginal introitus and lower vagina. The possibility of disease in this area is considered, this is likely amenable to direct clinical inspection for signs of mass. 2. Fluid density about the colon slightly increasing over time. The possibility disease in this area is not excluded as there is a lack of significant colonic inflammation in this location. Focused ultrasound and aspiration could be performed though findings about the vagina are more suspicious particularly given the report of close vaginal margins and the irregular appearance on CT that is definitely changed since the baseline CT evaluation of Dec 05, 2018 3. No findings to suggest metastatic disease to the chest. 4. Engorgement of central pulmonary vasculature slightly greater than 3 cm, raising the question of  pulmonary arterial hypertension. 5. Stable biliary and pancreatic ductal distension. 6. Aortic atherosclerosis. These results will be called to the ordering clinician or representative by the Radiologist Assistant, and communication documented in the PACS or [employer]. Aortic Atherosclerosis (ICD10-I70.0). Electronically Signed   By: Zetta Bills M.D.   On: 06/25/2020 15:42   NM PET Image Restag (PS) Skull Base To Thigh  Result Date: 07/20/2020 CLINICAL DATA:  Subsequent treatment strategy for rectal cancer. Chemotherapy last Friday. Radiation therapy most recent 1 year ago. Vaginal biopsy 1 week ago, demonstrating involvement by colorectal adenocarcinoma. History of rectovaginal fistula. COVID vaccine x2 most recent 12/23/2019. EXAM: NUCLEAR  MEDICINE PET SKULL BASE TO THIGH TECHNIQUE: 8.8 mCi F-18 FDG was injected intravenously. Full-ring PET imaging was performed from the skull base to thigh after the radiotracer. CT data was obtained and used for attenuation correction and anatomic localization. Fasting blood glucose: 86 mg/dl COMPARISON:  CTs 06/25/2020. FINDINGS: Mediastinal blood pool activity: SUV max 2.1 Liver activity: SUV max NA NECK: No areas of abnormal hypermetabolism. Incidental CT findings: No cervical adenopathy. CHEST: No pulmonary parenchymal or thoracic nodal hypermetabolism. Incidental CT findings: Deferred to recent diagnostic CT. Right Port-A-Cath tip mid to low right atrium. Aortic and coronary artery atherosclerosis. ABDOMEN/PELVIS: No abdominopelvic nodal hypermetabolism. There is hypermetabolism at the descending colon along its peripheral course through the ostomy (status post abdominal perineal resection). Example at a S.U.V. max of 5.0 on 184/3. This is separate from the right-sided fluid described on the prior diagnostic CT, which is relatively similar at maximally 2.1 cm on 181/3. Circumferential wall thickening and hypermetabolism within the vaginal introitus. Example at  a S.U.V. max of 12.3 on 236/3. Incidental CT findings: Deferred to recent diagnostic CT. Low-density bilateral renal lesions are likely cysts. Right abdominal wall hernia repair. Hysterectomy. Scattered colonic diverticula. SKELETON: No abnormal marrow activity. Incidental CT findings: Osteopenia IMPRESSION: 1. Circumferential wall thickening and hypermetabolism involving the vaginal introitus, consistent with the recent biopsy results of metastatic colorectal carcinoma. 2. Status post abdominal perineal resection with left abdominal ostomy. Hypermetabolism within the colon within the ostomy could be physiologic. This should be amenable to physical exam correlation to exclude residual/recurrent disease. 3. No other evidence of hypermetabolic metastasis. 4. Incidental findings, including: Aortic atherosclerosis (ICD10-I70.0) and emphysema (ICD10-J43.9). Electronically Signed   By: Abigail Miyamoto M.D.   On: 07/20/2020 14:15    ASSESSMENT & PLAN:  1. Encounter for antineoplastic chemotherapy   2. Rectal cancer ([place])   3. Vaginal bleeding   Cancer Staging Rectal cancer [facility]) Staging form: Colon and Rectum, AJCC 8th Edition - Clinical stage from 01/23/2019: Stage IIIC (cT4b, cN1a, cM0) - Signed by Earlie Server, MD on 01/23/2019 - Pathologic stage from 10/06/2019: Stage IIC (ypT4b, pN0, cM0) - Signed by Earlie Server, MD on 10/06/2019  #History of stage IIIC Rectal cancer, s/p TNT, followed by 09/17/19 APR/posterior vaginectomy/TAH/BSO/VY-flap pT4b pN0 with close vaginal margin 0.2 mm.  Uterus and ovaries negative for malignancy. Developed local recurrence.  Labs reviewed and discussed with patient. Chemotherapy-induced neutropenia has resolved. Recommend patient to start Xeloda 1500 mg twice daily on days of radiation.  Vaginal spotting due to malignancy, stopped.  #History of acute pulmonary embolism, bilateral DVT, unprovoked, continue Eliquis 2.5 mg twice daily.  Follow-up 10 days  Earlie Server, MD, PhD

## 2020-08-25 ENCOUNTER — Ambulatory Visit
Admission: RE | Admit: 2020-08-25 | Discharge: 2020-08-25 | Disposition: A | Discharge status: Home / Self Care | Payer: Medicare Other | Source: Ambulatory Visit | Attending: Radiation Oncology | Admitting: Radiation Oncology

## 2020-08-25 DIAGNOSIS — C20 Malignant neoplasm of rectum: Secondary | ICD-10-CM | POA: Diagnosis not present

## 2020-08-25 DIAGNOSIS — Z510 Encounter for antineoplastic radiation therapy: Secondary | ICD-10-CM | POA: Diagnosis not present

## 2020-08-26 ENCOUNTER — Ambulatory Visit
Admission: RE | Admit: 2020-08-26 | Discharge: 2020-08-26 | Disposition: A | Discharge status: Home / Self Care | Payer: Medicare Other | Source: Ambulatory Visit | Attending: Radiation Oncology | Admitting: Radiation Oncology

## 2020-08-26 DIAGNOSIS — Z933 Colostomy status: Secondary | ICD-10-CM | POA: Diagnosis not present

## 2020-08-26 DIAGNOSIS — Z510 Encounter for antineoplastic radiation therapy: Secondary | ICD-10-CM | POA: Diagnosis not present

## 2020-08-26 DIAGNOSIS — C20 Malignant neoplasm of rectum: Secondary | ICD-10-CM | POA: Diagnosis not present

## 2020-08-27 ENCOUNTER — Ambulatory Visit
Admission: RE | Admit: 2020-08-27 | Discharge: 2020-08-27 | Disposition: A | Discharge status: Home / Self Care | Payer: Medicare Other | Source: Ambulatory Visit | Attending: Radiation Oncology | Admitting: Radiation Oncology

## 2020-08-27 DIAGNOSIS — Z510 Encounter for antineoplastic radiation therapy: Secondary | ICD-10-CM | POA: Diagnosis not present

## 2020-08-27 DIAGNOSIS — C20 Malignant neoplasm of rectum: Secondary | ICD-10-CM | POA: Diagnosis not present

## 2020-08-30 ENCOUNTER — Ambulatory Visit
Admission: RE | Admit: 2020-08-30 | Discharge: 2020-08-30 | Disposition: A | Discharge status: Home / Self Care | Payer: Medicare Other | Source: Ambulatory Visit | Attending: Radiation Oncology | Admitting: Radiation Oncology

## 2020-08-30 DIAGNOSIS — C20 Malignant neoplasm of rectum: Secondary | ICD-10-CM | POA: Diagnosis not present

## 2020-08-30 DIAGNOSIS — Z510 Encounter for antineoplastic radiation therapy: Secondary | ICD-10-CM | POA: Diagnosis not present

## 2020-08-31 ENCOUNTER — Ambulatory Visit
Admission: RE | Admit: 2020-08-31 | Discharge: 2020-08-31 | Disposition: A | Discharge status: Home / Self Care | Payer: Medicare Other | Source: Ambulatory Visit | Attending: Radiation Oncology | Admitting: Radiation Oncology

## 2020-08-31 DIAGNOSIS — C20 Malignant neoplasm of rectum: Secondary | ICD-10-CM | POA: Diagnosis not present

## 2020-08-31 DIAGNOSIS — Z510 Encounter for antineoplastic radiation therapy: Secondary | ICD-10-CM | POA: Diagnosis not present

## 2020-09-01 ENCOUNTER — Ambulatory Visit
Admission: RE | Admit: 2020-09-01 | Discharge: 2020-09-01 | Disposition: A | Discharge status: Home / Self Care | Payer: Medicare Other | Source: Ambulatory Visit | Attending: Radiation Oncology | Admitting: Radiation Oncology

## 2020-09-01 DIAGNOSIS — Z510 Encounter for antineoplastic radiation therapy: Secondary | ICD-10-CM | POA: Diagnosis not present

## 2020-09-01 DIAGNOSIS — C20 Malignant neoplasm of rectum: Secondary | ICD-10-CM | POA: Diagnosis not present

## 2020-09-02 ENCOUNTER — Ambulatory Visit
Admission: RE | Admit: 2020-09-02 | Discharge: 2020-09-02 | Disposition: A | Discharge status: Home / Self Care | Payer: Medicare Other | Source: Ambulatory Visit | Attending: Radiation Oncology | Admitting: Radiation Oncology

## 2020-09-02 ENCOUNTER — Other Ambulatory Visit: Payer: Self-pay

## 2020-09-02 ENCOUNTER — Inpatient Hospital Stay: Payer: Medicare Other

## 2020-09-02 ENCOUNTER — Other Ambulatory Visit: Payer: Self-pay | Admitting: Internal Medicine

## 2020-09-02 ENCOUNTER — Inpatient Hospital Stay (HOSPITAL_BASED_OUTPATIENT_CLINIC_OR_DEPARTMENT_OTHER): Payer: Medicare Other | Admitting: Oncology

## 2020-09-02 ENCOUNTER — Encounter: Payer: Self-pay | Admitting: Oncology

## 2020-09-02 VITALS — BP 119/55 | HR 65 | Temp 97.3°F | Resp 16 | Wt 169.2 lb

## 2020-09-02 DIAGNOSIS — I2510 Atherosclerotic heart disease of native coronary artery without angina pectoris: Secondary | ICD-10-CM | POA: Diagnosis not present

## 2020-09-02 DIAGNOSIS — K5900 Constipation, unspecified: Secondary | ICD-10-CM | POA: Diagnosis not present

## 2020-09-02 DIAGNOSIS — C20 Malignant neoplasm of rectum: Secondary | ICD-10-CM

## 2020-09-02 DIAGNOSIS — I10 Essential (primary) hypertension: Secondary | ICD-10-CM | POA: Diagnosis not present

## 2020-09-02 DIAGNOSIS — C775 Secondary and unspecified malignant neoplasm of intrapelvic lymph nodes: Secondary | ICD-10-CM | POA: Diagnosis not present

## 2020-09-02 DIAGNOSIS — Z5111 Encounter for antineoplastic chemotherapy: Secondary | ICD-10-CM | POA: Diagnosis not present

## 2020-09-02 DIAGNOSIS — K219 Gastro-esophageal reflux disease without esophagitis: Secondary | ICD-10-CM | POA: Diagnosis not present

## 2020-09-02 DIAGNOSIS — Z87891 Personal history of nicotine dependence: Secondary | ICD-10-CM | POA: Diagnosis not present

## 2020-09-02 DIAGNOSIS — R634 Abnormal weight loss: Secondary | ICD-10-CM | POA: Diagnosis not present

## 2020-09-02 DIAGNOSIS — M129 Arthropathy, unspecified: Secondary | ICD-10-CM | POA: Diagnosis not present

## 2020-09-02 DIAGNOSIS — Z510 Encounter for antineoplastic radiation therapy: Secondary | ICD-10-CM | POA: Diagnosis not present

## 2020-09-02 DIAGNOSIS — Z86711 Personal history of pulmonary embolism: Secondary | ICD-10-CM

## 2020-09-02 DIAGNOSIS — Z86718 Personal history of other venous thrombosis and embolism: Secondary | ICD-10-CM

## 2020-09-02 DIAGNOSIS — Z79899 Other long term (current) drug therapy: Secondary | ICD-10-CM | POA: Diagnosis not present

## 2020-09-02 DIAGNOSIS — I700 Atherosclerosis of aorta: Secondary | ICD-10-CM | POA: Diagnosis not present

## 2020-09-02 DIAGNOSIS — E7800 Pure hypercholesterolemia, unspecified: Secondary | ICD-10-CM | POA: Diagnosis not present

## 2020-09-02 LAB — COMPREHENSIVE METABOLIC PANEL
ALT: 14 U/L (ref 0–44)
AST: 28 U/L (ref 15–41)
Albumin: 4.1 g/dL (ref 3.5–5.0)
Alkaline Phosphatase: 35 U/L — ABNORMAL LOW (ref 38–126)
Anion gap: 9 (ref 5–15)
BUN: 10 mg/dL (ref 8–23)
CO2: 30 mmol/L (ref 22–32)
Calcium: 9.5 mg/dL (ref 8.9–10.3)
Chloride: 99 mmol/L (ref 98–111)
Creatinine, Ser: 0.72 mg/dL (ref 0.44–1.00)
GFR, Estimated: >60 mL/min (ref >60)
Glucose, Bld: 91 mg/dL (ref 70–99)
Potassium: 3.8 mmol/L (ref 3.5–5.1)
Sodium: 138 mmol/L (ref 135–145)
Total Bilirubin: 0.5 mg/dL (ref 0.3–1.2)
Total Protein: 7.1 g/dL (ref 6.5–8.1)

## 2020-09-02 LAB — CBC WITH DIFFERENTIAL/PLATELET
Abs Immature Granulocytes: 0 10*3/uL (ref 0.00–0.07)
Basophils Absolute: 0 10*3/uL (ref 0.0–0.1)
Basophils Relative: 1 %
Eosinophils Absolute: 0.1 10*3/uL (ref 0.0–0.5)
Eosinophils Relative: 4 %
HCT: 35.4 % — ABNORMAL LOW (ref 36.0–46.0)
Hemoglobin: 11.3 g/dL — ABNORMAL LOW (ref 12.0–15.0)
Immature Granulocytes: 0 %
Lymphocytes Relative: 18 %
Lymphs Abs: 0.6 10*3/uL — ABNORMAL LOW (ref 0.7–4.0)
MCH: 30.3 pg (ref 26.0–34.0)
MCHC: 31.9 g/dL (ref 30.0–36.0)
MCV: 94.9 fL (ref 80.0–100.0)
Monocytes Absolute: 0.3 10*3/uL (ref 0.1–1.0)
Monocytes Relative: 9 %
Neutro Abs: 2.5 10*3/uL (ref 1.7–7.7)
Neutrophils Relative %: 68 %
Platelets: 227 10*3/uL (ref 150–400)
RBC: 3.73 MIL/uL — ABNORMAL LOW (ref 3.87–5.11)
RDW: 16.1 % — ABNORMAL HIGH (ref 11.5–15.5)
WBC: 3.6 10*3/uL — ABNORMAL LOW (ref 4.0–10.5)
nRBC: 0 % (ref 0.0–0.2)

## 2020-09-02 MED ORDER — APIXABAN 2.5 MG PO TABS
2.5000 mg | ORAL_TABLET | Freq: Two times a day (BID) | ORAL | 1 refills | Status: DC
Start: 2020-09-02 — End: 2020-12-15

## 2020-09-02 NOTE — Progress Notes (Signed)
Hematology/Oncology progress note [facility] Telephone:([phone] [phone]   Patient Care Team: Kirk Ruths, MD as PCP - General (Internal Medicine) Clent Jacks, RN as Registered Nurse Earlie Server, MD as Consulting Physician (Hematology and Oncology)  REFERRING PROVIDER: Dr.Secord CHIEF COMPLAINTS/REASON FOR VISIT:  Follow up for rectal cancer  HISTORY OF PRESENTING ILLNESS:  Jacqueline Yoder is a  74 y.o.  female with PMH listed below who was referred to me for evaluation of endometrial cancer.   Patient initially presented with complaints of postmenopausal bleeding on 08/16/2018.  History of was menopausal vaginal bleeding in 2016 which resulted in cervical polypectomy.  Pathology 02/04/2015 showed cervical polyp, consistent with benign endometrial polyp.  [name] after polypectomy due to anxiety associated with pelvic exams.  pelvic exam on 08/16/2018 reviewed cervical abnormality and from enlarged uterus. Seen by Dr. Marcelline Mates on 10/29/2018.  Endometrial biopsy and a Pap smear was performed. 10/29/2018 Pap smear showed adenocarcinoma, favor endometrial origin. 10/29/2018 endometrial biopsy showed endometrioid carcinoma, FIGO grade 1.  10/29/2018- TA & TV Ultrasound revealed: Anteverted uterus measuring 8.7 x 5.6 x 6.4 cm without evidence of focal masses.  The endometrium measuring 24.1 mm (thickened) and heterogeneous.  Right and left ovaries not visualized.  No adnexal masses identified.  No free fluid in cul-de-sac.  Patient was seen by Dr. Theora Gianotti in clinic on 11/13/2018.  Cervical exam reveals 2 cm exophytic irregular mass consistent with malignancy.   11/19/2018 CT chest abdomen pelvis with contrast showed thickened endometrium with some irregularity compatible with the provided diagnosis of endometrial malignancy.  There is a mildly prominent left inguinal node 1.4 cm.  Patient was seen by Dr. Fransisca Connors on 11/20/2018 and left groin  lymph node biopsy was recommended.  11/26/2018 patient underwent left inguinal lymph node biopsy. Pathology showed metastatic adenocarcinoma consistent with colorectal origin.  CDX 2+.  Case was discussed on tumor board.  Recommend colonoscopy for further evaluation.  Patient reports significant weight loss 30 pounds over the last year.  Chronic vaginal spotting. Change of bowel habits the past few months.  More constipated.  Family history positive for brother who has colon cancer prostate cancer.  patient has underwent colonoscopy on 12/03/2018 which reviewed a nonobstructing large mass in the rectum.  Also chronic fistula.  Mass was not circumferential.  This was biopsied with a cold forceps for histology.  Pathology came back hyperplastic polyp negative for dysplasia and malignancy. [name], patient underwent flex sigmoidoscopy on 12/06/2018 with rebiopsy of the rectal mass. This time biopsy results came back positive for invasive colorectal adenocarcinoma, moderately differentiated. Immunotherapy for nearly mismatch repair protein (MMR ) was performed.  There is no loss of MMR expression.  low probability of MSI high.   # Seen by [name] for evaluation of resectability for rectal cancer. In addition, she also had a second opinion with [facility] where her endometrial biopsy pathology was changed to  adenocarcinoma, consistent with colorectal primary.   [name]. She has home health that has been assisting with ostomy care  Patient was also evaluated by [facility] oncology.  Recommendation is to proceed with TNT with concurrent chemoradiation followed by neoadjuvant chemotherapy followed by surgical resection. Patient prefers to have treatment done locally with [facility].   # Oncology Treatment:  02/03/2019- 03/19/2019  concurrent Xeloda and radiation.  Xeloda dose 872m /m2 BID - rounded to 16549mBID- on days of  radiation. 04/09/2019, started  on FOLFOX with bolus 5-FU omitted.  07/16/2019 finished 8 cycles of FOLFOX.  # Patient has underwent 09/17/19 APR/posterior vaginectomy/TAH/BSO/VY-flap pT4b pN0 with close vaginal margin 0.2 mm.  Uterus and ovaries negative for malignancy. Patient reports bilateral lower extremity numbness and tingling, intermittent, left worse than right. She has lost a lot of weight since her APR surgery.   Surveillance plan Recommend history and physical/tumor marker monitoring every 3 to 6 months for the first 2 years after surgery and then every 6 months for a total of 5 years. Chest abdomen pelvis every 6 to 12 months for total 5 years. Colonoscopy 1 year after surgery, if no advanced adenoma, repeat in 3 years and then 5 years.  #Family history with half brother having's history of colon cancer prostate cancer.  Personal history of colorectal cancer.  Patient has not decided if she wants genetic testing.   # vaginal introitus mass biopsied. Pathology is consistent with metastatic colorectal adenocarcinoma  # history of PE( 01/13/2020)  in the bilateral lower extremity DVT (01/13/2020).   She finishes 6 months of anticoagulation with Eliquis 5 mg twice daily. Now switched to Eliquis 2.5 mg twice daily..  # She has now developed recurrent disease. I have discussed with [name]  Dr. Hester Mates and the mass is not resectable. Patient has also had colonoscopy by Dr. Vicente Males yesterday. Normal examination. # 07/16/2020 cycle 1 FOLFIRI  # 07/20/2020 PET scan was done for further evaluation, images are consistent with local recurrence, no distant metastasis. #Discussed with radiation oncology Dr. Baruch Gouty will recommends concurrent chemotherapy and radiation. 08/02/2020-08/16/2020, patient starts radiation.  Xeloda was held due to neutropenia 08/17/2000, started on Xeloda 1500 mg twice daily concurrently with radiation INTERVAL HISTORY Jacqueline Yoder is a 74 y.o. female who has above  history reviewed by me presents for follow-up of rectal cancer. Patient has no new complaints. Patient is taking Eliquis 2.5 mg twice daily Patient is on concurrent chemoradiation with Xeloda 1500 mg twice daily. Patient reports tolerating well. Vaginal spotting has stopped.  No fever or chills.  Denies any pain.   Review of Systems  Constitutional: Negative for appetite change, chills, fatigue, fever and unexpected weight change.  HENT:   Negative for hearing loss and voice change.   Eyes: Negative for eye problems.  Respiratory: Negative for chest tightness and cough.   Cardiovascular: Negative for chest pain.  Gastrointestinal: Negative for abdominal distention, abdominal pain, blood in stool, constipation, diarrhea and nausea.  Endocrine: Negative for hot flashes.  Genitourinary: Negative for difficulty urinating and frequency.   Musculoskeletal: Negative for arthralgias.  Skin: Negative for itching and rash.  Neurological: Negative for extremity weakness and numbness.  Hematological: Negative for adenopathy.  Psychiatric/Behavioral: Negative for confusion.    MEDICAL HISTORY:  Past Medical History:  Diagnosis Date  . Arthritis   . Family history of colon cancer   . GERD (gastroesophageal reflux disease)   . Hypercholesteremia   . Hypertension   . Hypertension   . Lower extremity edema   . Rectal cancer ([place]) 12/2018  . Urinary incontinence     SURGICAL HISTORY: Past Surgical History:  Procedure Laterality Date  . CHOLECYSTECTOMY  1971  . COLONOSCOPY WITH PROPOFOL N/A 12/03/2018   Procedure: COLONOSCOPY WITH PROPOFOL;  Surgeon: Lucilla Lame, MD;  Location: [facility] ENDOSCOPY;  Service: Endoscopy;  Laterality: N/A;  . COLONOSCOPY WITH PROPOFOL N/A 07/15/2020   Procedure: COLONOSCOPY WITH PROPOFOL;  Surgeon: [name], MD;  Location: [facility] ENDOSCOPY;  Service: Gastroenterology;  Laterality: N/A;  . FLEXIBLE SIGMOIDOSCOPY N/A 12/06/2018   Procedure: FLEXIBLE SIGMOIDOSCOPY;   Surgeon: [name], MD;  Location: [facility] ENDOSCOPY;  Service: Endoscopy;  Laterality: N/A;  . LAPAROSCOPIC COLOSTOMY  01/06/2019  . PORTACATH PLACEMENT N/A 04/03/2019   Procedure: INSERTION PORT-A-CATH;  Surgeon: Jules Husbands, MD;  Location: [facility] ORS;  Service: General;  Laterality: N/A;    SOCIAL HISTORY: Social History   Socioeconomic History  . Marital status: Married    Spouse name: Not on file  . Number of children: Not on file  . Years of education: Not on file  . Highest education level: Not on file  Occupational History  . Not on file  Tobacco Use  . Smoking status: Former Smoker    Quit date: 12/02/1977    Years since quitting: 42.7  . Smokeless tobacco: Never Used  Vaping Use  . Vaping Use: Never used  Substance and Sexual Activity  . Alcohol use: Never  . Drug use: Never  . Sexual activity: Not Currently    Birth control/protection: None  Other Topics Concern  . Not on file  Social History Narrative   Lives with daughter   Social Determinants of Health   Financial Resource Strain:   . Difficulty of Paying Living Expenses: Not on file  Food Insecurity:   . Worried About Charity fundraiser in the Last Year: Not on file  . Ran Out of Food in the Last Year: Not on file  Transportation Needs:   . Lack of Transportation (Medical): Not on file  . Lack of Transportation (Non-Medical): Not on file  Physical Activity:   . Days of Exercise per Week: Not on file  . Minutes of Exercise per Session: Not on file  Stress:   . Feeling of Stress : Not on file  Social Connections:   . Frequency of Communication with Friends and Family: Not on file  . Frequency of Social Gatherings with Friends and Family: Not on file  . Attends Religious Services: Not on file  . Active Member of Clubs or Organizations: Not on file  . Attends Archivist Meetings: Not on file  . Marital Status: Not on file  Intimate Partner Violence:   . Fear of Current or Ex-Partner: Not on  file  . Emotionally Abused: Not on file  . Physically Abused: Not on file  . Sexually Abused: Not on file    FAMILY HISTORY: Family History  Problem Relation Age of Onset  . Colon cancer Brother 62  . Hypertension Mother   . Stroke Mother   . Kidney failure Father   . Breast cancer Neg Hx   . Ovarian cancer Neg Hx     ALLERGIES:  is allergic to sulfamethoxazole-trimethoprim.  MEDICATIONS:  Current Outpatient Medications  Medication Sig Dispense Refill  . apixaban (ELIQUIS) 2.5 MG TABS tablet Take 1 tablet (2.5 mg total) by mouth 2 (two) times daily. 60 tablet 1  . capecitabine (XELODA) 500 MG tablet Take 3 tablets (1,500 mg total) by mouth 2 (two) times daily after a meal. Take Monday-Friday, only on days of radiation. 150 tablet 0  . chlorhexidine (PERIDEX) 0.12 % solution Use as directed 15 mLs in the mouth or throat 2 (two) times daily. 473 mL 3  . Cholecalciferol (VITAMIN D3) 2000 units capsule Take 2,000 Units by mouth daily.    . diclofenac sodium (VOLTAREN) 1 % GEL Apply 2 g topically 4 (four) times daily as needed (joint pain).  11  . docusate sodium (COLACE) 100 MG capsule Take 100 mg by mouth 2 (two) times daily.    . fexofenadine (ALLEGRA) 180 MG tablet Take 180 mg by mouth daily.    . fluticasone (FLONASE) 50 MCG/ACT nasal spray Place 1 spray into both nostrils 2 (two) times a day.     . gabapentin (NEURONTIN) 100 MG capsule Take 1 capsule (100 mg total) by mouth at bedtime. 90 capsule 0  . HYDROcodone-acetaminophen (NORCO/VICODIN) 5-325 MG tablet Take 1 tablet by mouth every 4 (four) hours as needed for moderate pain. 8 tablet 0  . ipratropium (ATROVENT) 0.03 % nasal spray     . lidocaine-prilocaine (EMLA) cream Apply 1 application topically as needed. 30 g 6  . loperamide (IMODIUM) 2 MG capsule Take 1 capsule (2 mg total) by mouth See admin instructions. With onset of loose stool, take $RemoveBef'4mg'gCUtYBkwoe$  followed by $RemoveBef'2mg'vJmeGzUAGs$  every 2 hours,  Maximum: 16 mg/day 120 capsule 1  . magic  mouthwash w/lidocaine SOLN Take 5 mLs by mouth 4 (four) times daily as needed for mouth pain. Sig: Swish/Swallow 5-10 ml four times a day as needed 480 mL 3  . Multiple Vitamins-Minerals (ONE-A-DAY WOMENS 50 PLUS PO) Take 1 tablet by mouth daily.     . ondansetron (ZOFRAN) 8 MG tablet Take 1 tablet (8 mg total) by mouth every 8 (eight) hours as needed for nausea, vomiting or refractory nausea / vomiting. 90 tablet 1  . pantoprazole (PROTONIX) 40 MG tablet Take 1 tablet (40 mg total) by mouth as needed. 30 tablet 0  . potassium chloride SA (K-DUR) 20 MEQ tablet Take 20 mEq by mouth daily.     . prochlorperazine (COMPAZINE) 10 MG tablet Take 1 tablet (10 mg total) by mouth every 6 (six) hours as needed (NAUSEA). 30 tablet 1  . simvastatin (ZOCOR) 40 MG tablet Take 40 mg by mouth at bedtime.     . triamterene-hydrochlorothiazide (DYAZIDE) 37.5-25 MG capsule Take 1 capsule by mouth daily.     . Ospemifene (OSPHENA) 60 MG TABS Take 1 tablet by mouth daily. (Patient not taking: Reported on 06/25/2020) 30 tablet 2   No current facility-administered medications for this visit.     PHYSICAL EXAMINATION: ECOG PERFORMANCE STATUS: 1 - Symptomatic but completely ambulatory Vitals:   09/02/20 1052  BP: (!) 119/55  Pulse: 65  Resp: 16  Temp: (!) 97.3 F (36.3 C)  SpO2: 100%   Filed Weights   09/02/20 1052  Weight: 169 lb 3.2 oz (76.7 kg)    Physical Exam Constitutional:      General: She is not in acute distress. HENT:     Head: Normocephalic and atraumatic.  Eyes:     General: No scleral icterus. Cardiovascular:     Rate and Rhythm: Normal rate and regular rhythm.     Heart sounds: Normal heart sounds.  Pulmonary:     Effort: Pulmonary effort is normal. No respiratory distress.     Breath sounds: No wheezing.  Abdominal:     General: Bowel sounds are normal. There is no distension.     Palpations: Abdomen is soft.     Comments: + Colostomy bag   Musculoskeletal:        General: No  deformity. Normal range of motion.     Cervical back: Normal range of motion and neck supple.  Skin:    General: Skin is warm and dry.     Findings: No erythema or rash.  Neurological:  Mental Status: She is alert and oriented to person, place, and time. Mental status is at baseline.     Cranial Nerves: No cranial nerve deficit.     Coordination: Coordination normal.  Psychiatric:        Mood and Affect: Mood normal.    CMP Latest Ref Rng & Units 09/02/2020  Glucose 70 - 99 mg/dL 91  BUN 8 - 23 mg/dL 10  Creatinine 0.44 - 1.00 mg/dL 0.72  Sodium 135 - 145 mmol/L 138  Potassium 3.5 - 5.1 mmol/L 3.8  Chloride 98 - 111 mmol/L 99  CO2 22 - 32 mmol/L 30  Calcium 8.9 - 10.3 mg/dL 9.5  Total Protein 6.5 - 8.1 g/dL 7.1  Total Bilirubin 0.3 - 1.2 mg/dL 0.5  Alkaline Phos 38 - 126 U/L 35(L)  AST 15 - 41 U/L 28  ALT 0 - 44 U/L 14   CBC Latest Ref Rng & Units 09/02/2020  WBC 4.0 - 10.5 K/uL 3.6(L)  Hemoglobin 12.0 - 15.0 g/dL 11.3(L)  Hematocrit 36 - 46 % 35.4(L)  Platelets 150 - 400 K/uL 227    LABORATORY DATA:  I have reviewed the data as listed Lab Results  Component Value Date   WBC 3.6 (L) 09/02/2020   HGB 11.3 (L) 09/02/2020   HCT 35.4 (L) 09/02/2020   MCV 94.9 09/02/2020   PLT 227 09/02/2020   Recent Labs    03/19/20 0826 03/19/20 0826 06/23/20 1124 06/23/20 1124 07/16/20 0802 07/23/20 1345 08/17/20 1007 08/24/20 1014 09/02/20 1028  NA 142   < > 140   < > 140   < > 139 139 138  K 4.1   < > 3.7   < > 3.2*   < > 3.7 4.1 3.8  CL 102   < > 100   < > 101   < > 101 99 99  CO2 31   < > 30   < > 29   < > $R'29 31 30  'jC$ GLUCOSE 95   < > 114*   < > 114*   < > 80 98 91  BUN 12   < > 10   < > 9   < > $R'11 12 10  'uR$ CREATININE 1.01*   < > 0.97   < > 0.87   < > 0.92 0.97 0.72  CALCIUM 9.9   < > 9.1   < > 9.2   < > 9.4 9.5 9.5  GFRNONAA 56*   < > 58*   < > >60   < > >60 >60 >60  GFRAA >60  --  >60  --  >60  --   --   --   --   PROT 7.5   < > 7.0   < > 6.9   < > 7.1 7.4 7.1   ALBUMIN 4.4   < > 4.0   < > 3.6   < > 4.0 4.2 4.1  AST 25   < > 25   < > 24   < > $R'30 24 28  'Ua$ ALT 16   < > 15   < > 14   < > $R'16 14 14  'Dy$ ALKPHOS 37*   < > 41   < > 41   < > 38 38 35*  BILITOT 0.8   < > 0.4   < > 0.5   < > 0.5 0.4 0.5   < > = values in this interval not displayed.   Iron/TIBC/Ferritin/ %Sat  Component Value Date/Time   IRON 40 06/23/2020 1124   TIBC 246 (L) 06/23/2020 1124   FERRITIN 338 (H) 06/23/2020 1124   IRONPCTSAT 16 06/23/2020 1124     RADIOGRAPHIC STUDIES: I have personally reviewed the radiological images as listed and agreed with the findings in the report. CT CHEST ABDOMEN PELVIS W CONTRAST  Result Date: 06/25/2020 CLINICAL DATA:  History of rectal cancer and vaginal bleeding with elevated CEA EXAM: CT CHEST, ABDOMEN, AND PELVIS WITH CONTRAST TECHNIQUE: Multidetector CT imaging of the chest, abdomen and pelvis was performed following the standard protocol during bolus administration of intravenous contrast. CONTRAST:  38mL OMNIPAQUE IOHEXOL 300 MG/ML  SOLN COMPARISON:  Feb 25, 2020 FINDINGS: CT CHEST FINDINGS Cardiovascular: RIGHT-sided Port-A-Cath terminates in the RIGHT atrium as before. Heart size is stable without pericardial effusion. Engorgement of central pulmonary vasculature slightly greater than 3 cm. Mediastinum/Nodes: Thoracic inlet structures are normal. No axillary lymphadenopathy. No mediastinal lymphadenopathy. No hilar lymphadenopathy. Esophagus grossly normal. Lungs/Pleura: No suspicious pulmonary mass or nodule. Airways are patent. Tiny nodule along the fissure in the RIGHT lower lobe is stable at approximately 2-3 mm. The signs of lingular and RIGHT middle lobe scarring. Musculoskeletal: See below for full musculoskeletal detail. Spinal degenerative changes. No acute process involving the bony thorax. CT ABDOMEN PELVIS FINDINGS Hepatobiliary: No focal, suspicious hepatic lesion. Post cholecystectomy with less biliary duct distension that was evident  on the prior study. Tiny hypodensity in the RIGHT hepatic lobe is stable. The portal vein is patent. Pancreas: Pancreas without inflammation or focal lesion. Mild ductal distension unchanged. Spleen: Spleen normal in size and contour without focal lesion. Adrenals/Urinary Tract: Adrenal glands are normal. Cysts of the bilateral kidneys are unchanged. No hydronephrosis. Urinary bladder under distended. Stomach/Bowel: Signs of APR with LEFT lower quadrant colostomy. Fluid density along the RIGHT lateral margin of the colon in the ostomy has increased slowly over time. To a lesser extent this is true along the LEFT lateral margin. There is no stranding about the colon as it passes through the abdominal wall. Greatest thickness of fluid at the level of the ostomy approximately 2 cm, previously approximately 1.4 cm. Vascular/Lymphatic: Calcified and noncalcified atheromatous plaque of the abdominal aorta. There is no gastrohepatic or hepatoduodenal ligament lymphadenopathy. No retroperitoneal or mesenteric lymphadenopathy. No pelvic sidewall lymphadenopathy. Reproductive: Increasing fullness about the vaginal introitus at the pelvic floor with masslike appearance suggested in this location best seen on image 118 of series 2 and image 73 of series 4 this is of uncertain significance but suspicious certainly more pronounced than in 2018-12-06 and more pronounced than on the prior exam Other: No peritoneal nodularity or ascites. Musculoskeletal: No acute bone process. No destructive bone finding. Spinal degenerative changes. IMPRESSION: 1. More masslike appearance of the vaginal introitus and lower vagina. The possibility of disease in this area is considered, this is likely amenable to direct clinical inspection for signs of mass. 2. Fluid density about the colon slightly increasing over time. The possibility disease in this area is not excluded as there is a lack of significant colonic inflammation in this location.  Focused ultrasound and aspiration could be performed though findings about the vagina are more suspicious particularly given the report of close vaginal margins and the irregular appearance on CT that is definitely changed since the baseline CT evaluation of 2018/12/06 3. No findings to suggest metastatic disease to the chest. 4. Engorgement of central pulmonary vasculature slightly greater than 3 cm, raising the question  of pulmonary arterial hypertension. 5. Stable biliary and pancreatic ductal distension. 6. Aortic atherosclerosis. These results will be called to the ordering clinician or representative by the Radiologist Assistant, and communication documented in the PACS or [employer]. Aortic Atherosclerosis (ICD10-I70.0). Electronically Signed   By: Zetta Bills M.D.   On: 06/25/2020 15:42   NM PET Image Restag (PS) Skull Base To Thigh  Result Date: 07/20/2020 CLINICAL DATA:  Subsequent treatment strategy for rectal cancer. Chemotherapy last Friday. Radiation therapy most recent 1 year ago. Vaginal biopsy 1 week ago, demonstrating involvement by colorectal adenocarcinoma. History of rectovaginal fistula. COVID vaccine x2 most recent 12/23/2019. EXAM: NUCLEAR MEDICINE PET SKULL BASE TO THIGH TECHNIQUE: 8.8 mCi F-18 FDG was injected intravenously. Full-ring PET imaging was performed from the skull base to thigh after the radiotracer. CT data was obtained and used for attenuation correction and anatomic localization. Fasting blood glucose: 86 mg/dl COMPARISON:  CTs 06/25/2020. FINDINGS: Mediastinal blood pool activity: SUV max 2.1 Liver activity: SUV max NA NECK: No areas of abnormal hypermetabolism. Incidental CT findings: No cervical adenopathy. CHEST: No pulmonary parenchymal or thoracic nodal hypermetabolism. Incidental CT findings: Deferred to recent diagnostic CT. Right Port-A-Cath tip mid to low right atrium. Aortic and coronary artery atherosclerosis. ABDOMEN/PELVIS: No abdominopelvic  nodal hypermetabolism. There is hypermetabolism at the descending colon along its peripheral course through the ostomy (status post abdominal perineal resection). Example at a S.U.V. max of 5.0 on 184/3. This is separate from the right-sided fluid described on the prior diagnostic CT, which is relatively similar at maximally 2.1 cm on 181/3. Circumferential wall thickening and hypermetabolism within the vaginal introitus. Example at a S.U.V. max of 12.3 on 236/3. Incidental CT findings: Deferred to recent diagnostic CT. Low-density bilateral renal lesions are likely cysts. Right abdominal wall hernia repair. Hysterectomy. Scattered colonic diverticula. SKELETON: No abnormal marrow activity. Incidental CT findings: Osteopenia IMPRESSION: 1. Circumferential wall thickening and hypermetabolism involving the vaginal introitus, consistent with the recent biopsy results of metastatic colorectal carcinoma. 2. Status post abdominal perineal resection with left abdominal ostomy. Hypermetabolism within the colon within the ostomy could be physiologic. This should be amenable to physical exam correlation to exclude residual/recurrent disease. 3. No other evidence of hypermetabolic metastasis. 4. Incidental findings, including: Aortic atherosclerosis (ICD10-I70.0) and emphysema (ICD10-J43.9). Electronically Signed   By: Abigail Miyamoto M.D.   On: 07/20/2020 14:15    CT CHEST ABDOMEN PELVIS W CONTRAST  Result Date: 06/25/2020 CLINICAL DATA:  History of rectal cancer and vaginal bleeding with elevated CEA EXAM: CT CHEST, ABDOMEN, AND PELVIS WITH CONTRAST TECHNIQUE: Multidetector CT imaging of the chest, abdomen and pelvis was performed following the standard protocol during bolus administration of intravenous contrast. CONTRAST:  60mL OMNIPAQUE IOHEXOL 300 MG/ML  SOLN COMPARISON:  Feb 25, 2020 FINDINGS: CT CHEST FINDINGS Cardiovascular: RIGHT-sided Port-A-Cath terminates in the RIGHT atrium as before. Heart size is stable  without pericardial effusion. Engorgement of central pulmonary vasculature slightly greater than 3 cm. Mediastinum/Nodes: Thoracic inlet structures are normal. No axillary lymphadenopathy. No mediastinal lymphadenopathy. No hilar lymphadenopathy. Esophagus grossly normal. Lungs/Pleura: No suspicious pulmonary mass or nodule. Airways are patent. Tiny nodule along the fissure in the RIGHT lower lobe is stable at approximately 2-3 mm. The signs of lingular and RIGHT middle lobe scarring. Musculoskeletal: See below for full musculoskeletal detail. Spinal degenerative changes. No acute process involving the bony thorax. CT ABDOMEN PELVIS FINDINGS Hepatobiliary: No focal, suspicious hepatic lesion. Post cholecystectomy with less biliary duct distension that was evident on the  prior study. Tiny hypodensity in the RIGHT hepatic lobe is stable. The portal vein is patent. Pancreas: Pancreas without inflammation or focal lesion. Mild ductal distension unchanged. Spleen: Spleen normal in size and contour without focal lesion. Adrenals/Urinary Tract: Adrenal glands are normal. Cysts of the bilateral kidneys are unchanged. No hydronephrosis. Urinary bladder under distended. Stomach/Bowel: Signs of APR with LEFT lower quadrant colostomy. Fluid density along the RIGHT lateral margin of the colon in the ostomy has increased slowly over time. To a lesser extent this is true along the LEFT lateral margin. There is no stranding about the colon as it passes through the abdominal wall. Greatest thickness of fluid at the level of the ostomy approximately 2 cm, previously approximately 1.4 cm. Vascular/Lymphatic: Calcified and noncalcified atheromatous plaque of the abdominal aorta. There is no gastrohepatic or hepatoduodenal ligament lymphadenopathy. No retroperitoneal or mesenteric lymphadenopathy. No pelvic sidewall lymphadenopathy. Reproductive: Increasing fullness about the vaginal introitus at the pelvic floor with masslike  appearance suggested in this location best seen on image 118 of series 2 and image 73 of series 4 this is of uncertain significance but suspicious certainly more pronounced than in 12/21/2018 and more pronounced than on the prior exam Other: No peritoneal nodularity or ascites. Musculoskeletal: No acute bone process. No destructive bone finding. Spinal degenerative changes. IMPRESSION: 1. More masslike appearance of the vaginal introitus and lower vagina. The possibility of disease in this area is considered, this is likely amenable to direct clinical inspection for signs of mass. 2. Fluid density about the colon slightly increasing over time. The possibility disease in this area is not excluded as there is a lack of significant colonic inflammation in this location. Focused ultrasound and aspiration could be performed though findings about the vagina are more suspicious particularly given the report of close vaginal margins and the irregular appearance on CT that is definitely changed since the baseline CT evaluation of Dec 21, 2018 3. No findings to suggest metastatic disease to the chest. 4. Engorgement of central pulmonary vasculature slightly greater than 3 cm, raising the question of pulmonary arterial hypertension. 5. Stable biliary and pancreatic ductal distension. 6. Aortic atherosclerosis. These results will be called to the ordering clinician or representative by the Radiologist Assistant, and communication documented in the PACS or [employer]. Aortic Atherosclerosis (ICD10-I70.0). Electronically Signed   By: Zetta Bills M.D.   On: 06/25/2020 15:42   NM PET Image Restag (PS) Skull Base To Thigh  Result Date: 07/20/2020 CLINICAL DATA:  Subsequent treatment strategy for rectal cancer. Chemotherapy last Friday. Radiation therapy most recent 1 year ago. Vaginal biopsy 1 week ago, demonstrating involvement by colorectal adenocarcinoma. History of rectovaginal fistula. COVID vaccine x2  most recent 12/23/2019. EXAM: NUCLEAR MEDICINE PET SKULL BASE TO THIGH TECHNIQUE: 8.8 mCi F-18 FDG was injected intravenously. Full-ring PET imaging was performed from the skull base to thigh after the radiotracer. CT data was obtained and used for attenuation correction and anatomic localization. Fasting blood glucose: 86 mg/dl COMPARISON:  CTs 06/25/2020. FINDINGS: Mediastinal blood pool activity: SUV max 2.1 Liver activity: SUV max NA NECK: No areas of abnormal hypermetabolism. Incidental CT findings: No cervical adenopathy. CHEST: No pulmonary parenchymal or thoracic nodal hypermetabolism. Incidental CT findings: Deferred to recent diagnostic CT. Right Port-A-Cath tip mid to low right atrium. Aortic and coronary artery atherosclerosis. ABDOMEN/PELVIS: No abdominopelvic nodal hypermetabolism. There is hypermetabolism at the descending colon along its peripheral course through the ostomy (status post abdominal perineal resection). Example at a S.U.V.  max of 5.0 on 184/3. This is separate from the right-sided fluid described on the prior diagnostic CT, which is relatively similar at maximally 2.1 cm on 181/3. Circumferential wall thickening and hypermetabolism within the vaginal introitus. Example at a S.U.V. max of 12.3 on 236/3. Incidental CT findings: Deferred to recent diagnostic CT. Low-density bilateral renal lesions are likely cysts. Right abdominal wall hernia repair. Hysterectomy. Scattered colonic diverticula. SKELETON: No abnormal marrow activity. Incidental CT findings: Osteopenia IMPRESSION: 1. Circumferential wall thickening and hypermetabolism involving the vaginal introitus, consistent with the recent biopsy results of metastatic colorectal carcinoma. 2. Status post abdominal perineal resection with left abdominal ostomy. Hypermetabolism within the colon within the ostomy could be physiologic. This should be amenable to physical exam correlation to exclude residual/recurrent disease. 3. No other  evidence of hypermetabolic metastasis. 4. Incidental findings, including: Aortic atherosclerosis (ICD10-I70.0) and emphysema (ICD10-J43.9). Electronically Signed   By: Abigail Miyamoto M.D.   On: 07/20/2020 14:15    ASSESSMENT & PLAN:  1. Encounter for antineoplastic chemotherapy   2. Rectal cancer ([place])   3. History of pulmonary embolism   4. History of DVT (deep vein thrombosis)   Cancer Staging Rectal cancer [facility]) Staging form: Colon and Rectum, AJCC 8th Edition - Clinical stage from 01/23/2019: Stage IIIC (cT4b, cN1a, cM0) - Signed by Earlie Server, MD on 01/23/2019 - Pathologic stage from 10/06/2019: Stage IIC (ypT4b, pN0, cM0) - Signed by Earlie Server, MD on 10/06/2019  #History of stage IIIC Rectal cancer, s/p TNT, followed by 09/17/19 APR/posterior vaginectomy/TAH/BSO/VY-flap pT4b pN0 with close vaginal margin 0.2 mm.  Uterus and ovaries negative for malignancy. Patient has developed local recurrence.  Currently doing concurrent chemo -Xeloda 1500 mg twice daily with radiation Labs are reviewed and discussed with patient Counts are acceptable to continue chemotherapy. Mild lymphocytopenia.  Monitor There is no clear benefit of adjuvant systemic chemotherapy in long-term prognosis. patient is not enthusiastic about proceeding with additional treatment at this point. She wants to further discuss with daughter and then decide. [place] Kitchen #History of acute pulmonary embolism, bilateral DVT, unprovoked, continue Eliquis 2.5 mg twice daily.   Follow-up 2 to 3 weeks.  Patient will come with daughter for further discussion about future plan.  Earlie Server, MD, PhD

## 2020-09-03 ENCOUNTER — Ambulatory Visit
Admission: RE | Admit: 2020-09-03 | Discharge: 2020-09-03 | Disposition: A | Discharge status: Home / Self Care | Payer: Medicare Other | Source: Ambulatory Visit | Attending: Radiation Oncology | Admitting: Radiation Oncology

## 2020-09-03 ENCOUNTER — Ambulatory Visit: Payer: Medicare Other | Admitting: Pharmacy Technician

## 2020-09-03 DIAGNOSIS — Z79899 Other long term (current) drug therapy: Secondary | ICD-10-CM

## 2020-09-03 DIAGNOSIS — C20 Malignant neoplasm of rectum: Secondary | ICD-10-CM | POA: Diagnosis not present

## 2020-09-03 DIAGNOSIS — Z510 Encounter for antineoplastic radiation therapy: Secondary | ICD-10-CM | POA: Diagnosis not present

## 2020-09-03 NOTE — Progress Notes (Signed)
Patient is in [place].  Provided patient with Eliquis to last until 10/16/20.  Patient acknowledged that she understands she will need to use her Medicare Part D plan beginning 10/16/20 to obtain this medication.  [facility]

## 2020-09-06 ENCOUNTER — Ambulatory Visit
Admission: RE | Admit: 2020-09-06 | Discharge: 2020-09-06 | Disposition: A | Discharge status: Home / Self Care | Payer: Medicare Other | Source: Ambulatory Visit | Attending: Radiation Oncology | Admitting: Radiation Oncology

## 2020-09-06 ENCOUNTER — Inpatient Hospital Stay: Payer: Medicare Other

## 2020-09-06 DIAGNOSIS — Z510 Encounter for antineoplastic radiation therapy: Secondary | ICD-10-CM | POA: Diagnosis not present

## 2020-09-06 DIAGNOSIS — C20 Malignant neoplasm of rectum: Secondary | ICD-10-CM | POA: Diagnosis not present

## 2020-09-06 NOTE — Progress Notes (Signed)
Nutrition Follow-up:  Patient with recurrent colorectal adenocarcinoma.  Patient completed last day of radiation today.  Xeloda on hold.   Met with patient following radiation.  Patient reports that her appetite is good.  Has been eating about 3 meals per day.  Sometimes has late breakfast and may skip lunch. Reports that before she was eating half of typical portion (ie 1/2 sandwich) now eating whole.  Reports that she is sleeping better.  Drinking shakes when does not eat a meal.  Denies any nutrition impact symptoms.     Medications: reviewed  Labs: reviewed  Anthropometrics:   Weight 11/18 169 lb 3.2 oz increased from 165 lb on 10/18  NUTRITION DIAGNOSIS: Inadequate oral intake improved   INTERVENTION:  Patient to continue eating good sources of protein. Reviewed those foods today.  Patient to call RD if appetite changes or has questions. Contact information given   NEXT VISIT: no follow-up RD available if nutrition status changes  Nicklas Mcsweeney B. Zenia Resides, [place], [place] Registered Dietitian [phone] (mobile)

## 2020-09-13 ENCOUNTER — Other Ambulatory Visit: Payer: Self-pay

## 2020-09-13 ENCOUNTER — Encounter: Payer: Self-pay | Admitting: Family Medicine

## 2020-09-13 ENCOUNTER — Ambulatory Visit (INDEPENDENT_AMBULATORY_CARE_PROVIDER_SITE_OTHER): Payer: Medicare Other | Admitting: Family Medicine

## 2020-09-13 VITALS — BP 110/55 | HR 74 | Temp 97.5°F | Resp 16 | Ht 65.0 in | Wt 167.6 lb

## 2020-09-13 DIAGNOSIS — Z933 Colostomy status: Secondary | ICD-10-CM

## 2020-09-13 DIAGNOSIS — D701 Agranulocytosis secondary to cancer chemotherapy: Secondary | ICD-10-CM

## 2020-09-13 DIAGNOSIS — I825Z3 Chronic embolism and thrombosis of unspecified deep veins of distal lower extremity, bilateral: Secondary | ICD-10-CM | POA: Diagnosis not present

## 2020-09-13 DIAGNOSIS — C20 Malignant neoplasm of rectum: Secondary | ICD-10-CM

## 2020-09-13 DIAGNOSIS — Z1231 Encounter for screening mammogram for malignant neoplasm of breast: Secondary | ICD-10-CM

## 2020-09-13 DIAGNOSIS — L602 Onychogryphosis: Secondary | ICD-10-CM | POA: Diagnosis not present

## 2020-09-13 DIAGNOSIS — Z7901 Long term (current) use of anticoagulants: Secondary | ICD-10-CM

## 2020-09-13 DIAGNOSIS — E782 Mixed hyperlipidemia: Secondary | ICD-10-CM

## 2020-09-13 DIAGNOSIS — I129 Hypertensive chronic kidney disease with stage 1 through stage 4 chronic kidney disease, or unspecified chronic kidney disease: Secondary | ICD-10-CM

## 2020-09-13 DIAGNOSIS — N182 Chronic kidney disease, stage 2 (mild): Secondary | ICD-10-CM

## 2020-09-13 NOTE — Patient Instructions (Addendum)
Thank you for coming to the office today.  For Mammogram screening for breast cancer   Call the [facility] below anytime to schedule your own appointment now that order has been placed.  [facility] [place], [place] [address] Phone: [phone]  -------------------  [facility] referral for toenails, they will call you  Keep track of BP.  If too low or dizzy or lightheaded, let me know or next time we can adjust medicine.   Please schedule a Follow-up Appointment to: Return in about 3 months (around 12/13/2020) for 3 month follow-up HTN, Onc updates.  If you have any other questions or concerns, please feel free to call the office or send a message through [place]. You may also schedule an earlier appointment if necessary.  Additionally, you may be receiving a survey about your experience at our office within a few days to 1 week by e-mail or mail. We value your feedback.  Nobie Putnam, DO [place]

## 2020-09-13 NOTE — Progress Notes (Signed)
Subjective:    Patient ID: Jacqueline Yoder, female    DOB: 1946-05-14, 74 y.o.   MRN: [id number]  Jacqueline Yoder is a 74 y.o. female presenting on 09/13/2020 for Establish Care and Hypertension  History provided by patient and daughter, Jacqueline Yoder.  HPI   Oncologist - Dr Earlie Server [facility]) Radiation Oncologist - Dr Noreene Filbert [facility]) [place] CNM ([facility])  Maudry Mayhew Request referral to Podiatry [facility], same as her husband.  Metastatic Adenocarcinoma Carcinoma of Colon Spread to Endometrium/Uterine Cancer Initial diagnosis followed since 2019 with postmenopausal vaginal bleeding and spotting, and ultimately led to tissue diagnosis from endometrial biopsy.  S/p12/2/20 APR/posterior vaginectomy / Hysterectomy (total abdominal) /BSO/VY-flap  She did chemo and radiation. And is now on surveillance plan with blood work tumor marker every 3-6 months for first 2 years, and then every 6 months for up to 5 years.  She has Colostomy bag.  Last year identified some blood clots on CT scanning, with clot in lung with identified PE (12/2019) and bilateral lower extremity (12/2019), she was treated with Eliquis for anticoagulation. Transitioned to Eliquis 2.5mg  BID.  Last visit 09/02/20 - there was identified recurrent disease.  Was advised about future follow-up with future chemo/radiation plans and surveillance.  She has had some weight loss during course of past >1 year with treatment for cancer.  She has worked with nutritionist, and now has better appetite and taking in better nutrition.  Hyperlipidemia Currently taking Simvastatin 40mg  daily at bedtime.  CHRONIC HTN: Reports home readings 110-120 / 60-70s Current Meds - Triamterene-HCTZ 37.5-25mg  daily   Reports good compliance, took meds today. Tolerating well, w/o complaints. Denies CP, dyspnea, HA, edema, dizziness / lightheadedness   Health Maintenance: Due mammogram,  ordered.  Depression screen PHQ 2/9 09/13/2020  Decreased Interest 0  Down, Depressed, Hopeless 0  PHQ - 2 Score 0  Some recent data might be hidden    Past Medical History:  Diagnosis Date  . Allergy   . Arthritis   . Blood clot in vein   . Family history of colon cancer   . GERD (gastroesophageal reflux disease)   . Hypercholesteremia   . Hypertension   . Hypertension   . Lower extremity edema   . Rectal cancer ([place]) 12/2018  . Urinary incontinence    Past Surgical History:  Procedure Laterality Date  . CHOLECYSTECTOMY  1971  . COLONOSCOPY WITH PROPOFOL N/A 12/03/2018   Procedure: COLONOSCOPY WITH PROPOFOL;  Surgeon: Lucilla Lame, MD;  Location: [facility] ENDOSCOPY;  Service: Endoscopy;  Laterality: N/A;  . COLONOSCOPY WITH PROPOFOL N/A 07/15/2020   Procedure: COLONOSCOPY WITH PROPOFOL;  Surgeon: [name], MD;  Location: [facility] ENDOSCOPY;  Service: Gastroenterology;  Laterality: N/A;  . FLEXIBLE SIGMOIDOSCOPY N/A 12/06/2018   Procedure: FLEXIBLE SIGMOIDOSCOPY;  Surgeon: [name], MD;  Location: [facility] ENDOSCOPY;  Service: Endoscopy;  Laterality: N/A;  . LAPAROSCOPIC COLOSTOMY  01/06/2019  . PORTACATH PLACEMENT N/A 04/03/2019   Procedure: INSERTION PORT-A-CATH;  Surgeon: Jules Husbands, MD;  Location: [facility] ORS;  Service: General;  Laterality: N/A;   Social History   Socioeconomic History  . Marital status: Married    Spouse name: Not on file  . Number of children: Not on file  . Years of education: Not on file  . Highest education level: Not on file  Occupational History  . Not on file  Tobacco Use  . Smoking status: Former Smoker    Quit date: 12/02/1977  Years since quitting: 42.8  . Smokeless tobacco: Former Network engineer  . Vaping Use: Never used  Substance and Sexual Activity  . Alcohol use: Never  . Drug use: Never  . Sexual activity: Not Currently    Birth control/protection: None  Other Topics Concern  . Not on file  Social History Narrative   Lives  with daughter   Social Determinants of Health   Financial Resource Strain:   . Difficulty of Paying Living Expenses: Not on file  Food Insecurity:   . Worried About Charity fundraiser in the Last Year: Not on file  . Ran Out of Food in the Last Year: Not on file  Transportation Needs:   . Lack of Transportation (Medical): Not on file  . Lack of Transportation (Non-Medical): Not on file  Physical Activity:   . Days of Exercise per Week: Not on file  . Minutes of Exercise per Session: Not on file  Stress:   . Feeling of Stress : Not on file  Social Connections:   . Frequency of Communication with Friends and Family: Not on file  . Frequency of Social Gatherings with Friends and Family: Not on file  . Attends Religious Services: Not on file  . Active Member of Clubs or Organizations: Not on file  . Attends Archivist Meetings: Not on file  . Marital Status: Not on file  Intimate Partner Violence:   . Fear of Current or Ex-Partner: Not on file  . Emotionally Abused: Not on file  . Physically Abused: Not on file  . Sexually Abused: Not on file   Family History  Problem Relation Age of Onset  . Colon cancer Brother 87       exposure to chemicals [place]  . Hypertension Mother   . Stroke Mother   . Kidney failure Father   . Breast cancer Neg Hx   . Ovarian cancer Neg Hx    Current Outpatient Medications on File Prior to Visit  Medication Sig  . apixaban (ELIQUIS) 2.5 MG TABS tablet Take 1 tablet (2.5 mg total) by mouth 2 (two) times daily.  . Cholecalciferol (VITAMIN D3) 2000 units capsule Take 2,000 Units by mouth daily.  . diclofenac sodium (VOLTAREN) 1 % GEL Apply 2 g topically 4 (four) times daily as needed (joint pain).   [place] Kitchen docusate sodium (COLACE) 100 MG capsule Take 100 mg by mouth 2 (two) times daily.  . fexofenadine (ALLEGRA) 180 MG tablet Take 180 mg by mouth daily.  . fluticasone (FLONASE) 50 MCG/ACT nasal spray Place 1 spray into both nostrils 2 (two)  times a day.   . ipratropium (ATROVENT) 0.03 % nasal spray   . lidocaine-prilocaine (EMLA) cream Apply 1 application topically as needed.  . loperamide (IMODIUM) 2 MG capsule Take 1 capsule (2 mg total) by mouth See admin instructions. With onset of loose stool, take 4mg  followed by 2mg  every 2 hours,  Maximum: 16 mg/day  . Multiple Vitamins-Minerals (ONE-A-DAY WOMENS 50 PLUS PO) Take 1 tablet by mouth daily.   . Ospemifene (OSPHENA) 60 MG TABS Take 1 tablet by mouth daily.  . potassium chloride SA (K-DUR) 20 MEQ tablet Take 20 mEq by mouth daily.   . simvastatin (ZOCOR) 40 MG tablet Take 40 mg by mouth at bedtime.   . triamterene-hydrochlorothiazide (DYAZIDE) 37.5-25 MG capsule Take 1 capsule by mouth daily.    No current facility-administered medications on file prior to visit.    Review of Systems Per  HPI unless specifically indicated above      Objective:    BP (!) 110/55 (BP Location: Left Arm, Cuff Size: Normal)   Pulse 74   Temp (!) 97.5 F (36.4 C) (Temporal)   Resp 16   Ht 5\' 5"  (1.651 m)   Wt 167 lb 9.6 oz (76 kg)   SpO2 100%   BMI 27.89 kg/m   Wt Readings from Last 3 Encounters:  09/13/20 167 lb 9.6 oz (76 kg)  09/02/20 169 lb 3.2 oz (76.7 kg)  08/24/20 167 lb (75.8 kg)    Physical Exam Vitals and nursing note reviewed.  Constitutional:      General: She is not in acute distress.    Appearance: She is well-developed. She is not diaphoretic.     Comments: Well-appearing, comfortable, cooperative  HENT:     Head: Normocephalic and atraumatic.  Eyes:     General:        Right eye: No discharge.        Left eye: No discharge.     Conjunctiva/sclera: Conjunctivae normal.  Cardiovascular:     Rate and Rhythm: Normal rate.  Pulmonary:     Effort: Pulmonary effort is normal.  Abdominal:     Comments: Colostomy bag  Musculoskeletal:     Right lower leg: No edema.     Left lower leg: No edema.  Skin:    General: Skin is warm and dry.     Findings: No  erythema or rash.  Neurological:     Mental Status: She is alert and oriented to person, place, and time.  Psychiatric:        Behavior: Behavior normal.     Comments: Well groomed, good eye contact, normal speech and thoughts        Results for orders placed or performed in visit on 09/02/20  Comprehensive metabolic panel  Result Value Ref Range   Sodium 138 135 - 145 mmol/L   Potassium 3.8 3.5 - 5.1 mmol/L   Chloride 99 98 - 111 mmol/L   CO2 30 22 - 32 mmol/L   Glucose, Bld 91 70 - 99 mg/dL   BUN 10 8 - 23 mg/dL   Creatinine, Ser 0.72 0.44 - 1.00 mg/dL   Calcium 9.5 8.9 - 10.3 mg/dL   Total Protein 7.1 6.5 - 8.1 g/dL   Albumin 4.1 3.5 - 5.0 g/dL   AST 28 15 - 41 U/L   ALT 14 0 - 44 U/L   Alkaline Phosphatase 35 (L) 38 - 126 U/L   Total Bilirubin 0.5 0.3 - 1.2 mg/dL   GFR, Estimated >60 >60 mL/min   Anion gap 9 5 - 15  CBC with Differential  Result Value Ref Range   WBC 3.6 (L) 4.0 - 10.5 K/uL   RBC 3.73 (L) 3.87 - 5.11 MIL/uL   Hemoglobin 11.3 (L) 12.0 - 15.0 g/dL   HCT 35.4 (L) 36 - 46 %   MCV 94.9 80.0 - 100.0 fL   MCH 30.3 26.0 - 34.0 pg   MCHC 31.9 30.0 - 36.0 g/dL   RDW 16.1 (H) 11.5 - 15.5 %   Platelets 227 150 - 400 K/uL   nRBC 0.0 0.0 - 0.2 %   Neutrophils Relative % 68 %   Neutro Abs 2.5 1.7 - 7.7 K/uL   Lymphocytes Relative 18 %   Lymphs Abs 0.6 (L) 0.7 - 4.0 K/uL   Monocytes Relative 9 %   Monocytes Absolute 0.3 0.1 - 1.0  K/uL   Eosinophils Relative 4 %   Eosinophils Absolute 0.1 0.0 - 0.5 K/uL   Basophils Relative 1 %   Basophils Absolute 0.0 0.0 - 0.1 K/uL   Immature Granulocytes 0 %   Abs Immature Granulocytes 0.00 0.00 - 0.07 K/uL      Assessment & Plan:   Problem List Items Addressed This Visit    Rectal cancer ([place])   Hyperlipidemia   Current use of long term anticoagulation   Colostomy in place [facility])   Chronic deep vein thrombosis (DVT) ([place])   Chemotherapy induced neutropenia ([facility])   Benign hypertension with CKD (chronic kidney  disease), stage II    Other Visit Diagnoses    Overgrown toenails    -  Primary   Relevant Orders   Ambulatory referral to Podiatry   Encounter for screening mammogram for malignant neoplasm of breast       Relevant Orders   MM DIGITAL SCREENING BILATERAL      #Adenocarcinoma, colon, metastatic Managed by [facility] Oncology / Rad Onc Currently awaiting next follow-up this week for further updates with surveillance and management in interval.  #Chronic DVT/PE On Anticoagulation Eliquis 2.5mg  BID  #HTN, CKD II Controlled Has had weight loss over months, and considering may no longer need this dose of BP medication, they will check BP at home and follow up next visit may adjust dose or reduce to monotherapy.  #Hyperlipidemia Continue statin therapy. Future lipids due after 03/2021  No orders of the defined types were placed in this encounter.     Follow up plan: Return in about 3 months (around 12/13/2020) for 3 month follow-up HTN, Onc updates.  Nobie Putnam, [facility] 09/13/2020, 3:26 PM

## 2020-09-16 ENCOUNTER — Other Ambulatory Visit: Payer: Self-pay

## 2020-09-16 ENCOUNTER — Inpatient Hospital Stay: Payer: Medicare Other | Attending: Oncology | Admitting: Oncology

## 2020-09-16 ENCOUNTER — Encounter: Payer: Self-pay | Admitting: Oncology

## 2020-09-16 ENCOUNTER — Inpatient Hospital Stay: Payer: Medicare Other

## 2020-09-16 VITALS — BP 110/71 | HR 79 | Temp 98.6°F | Resp 16 | Wt 166.2 lb

## 2020-09-16 DIAGNOSIS — C20 Malignant neoplasm of rectum: Secondary | ICD-10-CM | POA: Diagnosis not present

## 2020-09-16 DIAGNOSIS — Z9221 Personal history of antineoplastic chemotherapy: Secondary | ICD-10-CM | POA: Diagnosis not present

## 2020-09-16 DIAGNOSIS — Z7901 Long term (current) use of anticoagulants: Secondary | ICD-10-CM | POA: Insufficient documentation

## 2020-09-16 DIAGNOSIS — R609 Edema, unspecified: Secondary | ICD-10-CM | POA: Insufficient documentation

## 2020-09-16 DIAGNOSIS — R634 Abnormal weight loss: Secondary | ICD-10-CM | POA: Diagnosis not present

## 2020-09-16 DIAGNOSIS — Z800 Family history of malignant neoplasm of digestive organs: Secondary | ICD-10-CM | POA: Diagnosis not present

## 2020-09-16 DIAGNOSIS — Z8041 Family history of malignant neoplasm of ovary: Secondary | ICD-10-CM | POA: Diagnosis not present

## 2020-09-16 DIAGNOSIS — Z79899 Other long term (current) drug therapy: Secondary | ICD-10-CM | POA: Diagnosis not present

## 2020-09-16 DIAGNOSIS — Z86711 Personal history of pulmonary embolism: Secondary | ICD-10-CM

## 2020-09-16 DIAGNOSIS — I10 Essential (primary) hypertension: Secondary | ICD-10-CM | POA: Insufficient documentation

## 2020-09-16 DIAGNOSIS — K219 Gastro-esophageal reflux disease without esophagitis: Secondary | ICD-10-CM | POA: Insufficient documentation

## 2020-09-16 DIAGNOSIS — Z923 Personal history of irradiation: Secondary | ICD-10-CM | POA: Diagnosis not present

## 2020-09-16 DIAGNOSIS — Z87891 Personal history of nicotine dependence: Secondary | ICD-10-CM | POA: Insufficient documentation

## 2020-09-16 DIAGNOSIS — Z452 Encounter for adjustment and management of vascular access device: Secondary | ICD-10-CM | POA: Insufficient documentation

## 2020-09-16 DIAGNOSIS — E7800 Pure hypercholesterolemia, unspecified: Secondary | ICD-10-CM | POA: Diagnosis not present

## 2020-09-16 DIAGNOSIS — Z86718 Personal history of other venous thrombosis and embolism: Secondary | ICD-10-CM | POA: Diagnosis not present

## 2020-09-16 NOTE — Progress Notes (Signed)
Patient's neuropathy is stable.

## 2020-09-16 NOTE — Progress Notes (Signed)
Hematology/Oncology progress note [facility] [phone] [phone]   Patient Care Team: Karamalegos, Alexander J, DO as PCP - General (Family Medicine) Stanton, Kristi D, RN as Registered Nurse Naji Mehringer, MD as Consulting Physician (Hematology and Oncology)  REFERRING PROVIDER: Dr.Secord CHIEF COMPLAINTS/REASON FOR VISIT:  Follow up for rectal cancer  HISTORY OF PRESENTING ILLNESS:  Jacqueline Yoder is a  74 y.o.  female with PMH listed below who was referred to me for evaluation of endometrial cancer.   Patient initially presented with complaints of postmenopausal bleeding on 08/16/2018.  History of was menopausal vaginal bleeding in 2016 which resulted in cervical polypectomy.  Pathology 02/04/2015 showed cervical polyp, consistent with benign endometrial polyp.  [name] after polypectomy due to anxiety associated with pelvic exams.  pelvic exam on 08/16/2018 reviewed cervical abnormality and from enlarged uterus. Seen by Dr. Cherry on 10/29/2018.  Endometrial biopsy and a Pap smear was performed. 10/29/2018 Pap smear showed adenocarcinoma, favor endometrial origin. 10/29/2018 endometrial biopsy showed endometrioid carcinoma, FIGO grade 1.  10/29/2018- TA & TV Ultrasound revealed: Anteverted uterus measuring 8.7 x 5.6 x 6.4 cm without evidence of focal masses.  The endometrium measuring 24.1 mm (thickened) and heterogeneous.  Right and left ovaries not visualized.  No adnexal masses identified.  No free fluid in cul-de-sac.  Patient was seen by Dr. Secord in clinic on 11/13/2018.  Cervical exam reveals 2 cm exophytic irregular mass consistent with malignancy.   11/19/2018 CT chest abdomen pelvis with contrast showed thickened endometrium with some irregularity compatible with the provided diagnosis of endometrial malignancy.  There is a mildly prominent left inguinal node 1.4 cm.  Patient was seen by Dr. Berchuck on 11/20/2018 and left groin  lymph node biopsy was recommended.  11/26/2018 patient underwent left inguinal lymph node biopsy. Pathology showed metastatic adenocarcinoma consistent with colorectal origin.  CDX 2+.  Case was discussed on tumor board.  Recommend colonoscopy for further evaluation.  Patient reports significant weight loss 30 pounds over the last year.  Chronic vaginal spotting. Change of bowel habits the past few months.  More constipated.  Family history positive for brother who has colon cancer prostate cancer.  patient has underwent colonoscopy on 12/03/2018 which reviewed a nonobstructing large mass in the rectum.  Also chronic fistula.  Mass was not circumferential.  This was biopsied with a cold forceps for histology.  Pathology came back hyperplastic polyp negative for dysplasia and malignancy. [name], patient underwent flex sigmoidoscopy on 12/06/2018 with rebiopsy of the rectal mass. This time biopsy results came back positive for invasive colorectal adenocarcinoma, moderately differentiated. Immunotherapy for nearly mismatch repair protein (MMR ) was performed.  There is no loss of MMR expression.  low probability of MSI high.   # Seen by [name] for evaluation of resectability for rectal cancer. In addition, she also had a second opinion with [facility] pathology where her endometrial biopsy pathology was changed to  adenocarcinoma, consistent with colorectal primary.   [name]. She has home health that has been assisting with ostomy care  Patient was also evaluated by [facility] oncology.  Recommendation is to proceed with TNT with concurrent chemoradiation followed by neoadjuvant chemotherapy followed by surgical resection. Patient prefers to have treatment done locally with [facility].   # Oncology Treatment:  02/03/2019- 03/19/2019  concurrent Xeloda and radiation.  Xeloda dose 825mg /m2 BID - rounded to 1650mg BID- on days of  radiation. 04/09/2019, started   on FOLFOX with bolus 5-FU omitted.  07/16/2019 finished 8 cycles of FOLFOX.  # Patient has underwent 09/17/19 APR/posterior vaginectomy/TAH/BSO/VY-flap pT4b pN0 with close vaginal margin 0.2 mm.  Uterus and ovaries negative for malignancy. Patient reports bilateral lower extremity numbness and tingling, intermittent, left worse than right. She has lost a lot of weight since her APR surgery.   Surveillance plan Recommend history and physical/tumor marker monitoring every 3 to 6 months for the first 2 years after surgery and then every 6 months for a total of 5 years. Chest abdomen pelvis every 6 to 12 months for total 5 years. Colonoscopy 1 year after surgery, if no advanced adenoma, repeat in 3 years and then 5 years.  #Family history with half brother having's history of colon cancer prostate cancer.  Personal history of colorectal cancer.  Patient has not decided if she wants genetic testing.   # vaginal introitus mass biopsied. Pathology is consistent with metastatic colorectal adenocarcinoma  # history of PE( 01/13/2020)  in the bilateral lower extremity DVT (01/13/2020).   She finishes 6 months of anticoagulation with Eliquis 5 mg twice daily. Now switched to Eliquis 2.5 mg twice daily..  # She has now developed recurrent disease. I have discussed with [name]  Dr. Hester Mates and the mass is not resectable. Patient has also had colonoscopy by Dr. Vicente Males yesterday. Normal examination. # 07/16/2020 cycle 1 FOLFIRI  # 07/20/2020 PET scan was done for further evaluation, images are consistent with local recurrence, no distant metastasis. #Discussed with radiation oncology Dr. Baruch Gouty will recommends concurrent chemotherapy and radiation. 08/02/2020-08/16/2020, patient starts radiation.  Xeloda was held due to neutropenia 08/17/2020,-09/06/2020 Xeloda 1500 mg twice daily concurrently with radiation INTERVAL HISTORY Jacqueline Yoder is a 74 y.o. female who has above  history reviewed by me presents for follow-up of rectal cancer. Patient has no new complaints. Patient is taking Eliquis 2.5 mg twice daily.  No vaginal spotting.  She has finished concurrent chemoradiation to local rectal recurrence. She tolerated the treatment well.  Today she reports feeling very well.  No new complaints.  Denies any pain.  Review of Systems  Constitutional: Negative for appetite change, chills, fatigue, fever and unexpected weight change.  HENT:   Negative for hearing loss and voice change.   Eyes: Negative for eye problems.  Respiratory: Negative for chest tightness and cough.   Cardiovascular: Negative for chest pain.  Gastrointestinal: Negative for abdominal distention, abdominal pain, blood in stool, constipation, diarrhea and nausea.  Endocrine: Negative for hot flashes.  Genitourinary: Negative for difficulty urinating and frequency.   Musculoskeletal: Negative for arthralgias.  Skin: Negative for itching and rash.  Neurological: Negative for extremity weakness and numbness.  Hematological: Negative for adenopathy.  Psychiatric/Behavioral: Negative for confusion.    MEDICAL HISTORY:  Past Medical History:  Diagnosis Date  . Allergy   . Arthritis   . Blood clot in vein   . Family history of colon cancer   . GERD (gastroesophageal reflux disease)   . Hypercholesteremia   . Hypertension   . Hypertension   . Lower extremity edema   . Rectal cancer ([place]) 12/2018  . Urinary incontinence     SURGICAL HISTORY: Past Surgical History:  Procedure Laterality Date  . CHOLECYSTECTOMY  1971  . COLONOSCOPY WITH PROPOFOL N/A 12/03/2018   Procedure: COLONOSCOPY WITH PROPOFOL;  Surgeon: Lucilla Lame, MD;  Location: [facility] ENDOSCOPY;  Service: Endoscopy;  Laterality: N/A;  . COLONOSCOPY WITH PROPOFOL N/A 07/15/2020   Procedure: COLONOSCOPY WITH  PROPOFOL;  Surgeon: [name], MD;  Location: [facility] ENDOSCOPY;  Service: Gastroenterology;  Laterality: N/A;  . FLEXIBLE  SIGMOIDOSCOPY N/A 12/06/2018   Procedure: FLEXIBLE SIGMOIDOSCOPY;  Surgeon: [name], MD;  Location: [facility] ENDOSCOPY;  Service: Endoscopy;  Laterality: N/A;  . LAPAROSCOPIC COLOSTOMY  01/06/2019  . PORTACATH PLACEMENT N/A 04/03/2019   Procedure: INSERTION PORT-A-CATH;  Surgeon: Jules Husbands, MD;  Location: [facility] ORS;  Service: General;  Laterality: N/A;    SOCIAL HISTORY: Social History   Socioeconomic History  . Marital status: Married    Spouse name: Not on file  . Number of children: Not on file  . Years of education: Not on file  . Highest education level: Not on file  Occupational History  . Not on file  Tobacco Use  . Smoking status: Former Smoker    Quit date: 12/02/1977    Years since quitting: 42.8  . Smokeless tobacco: Former Network engineer  . Vaping Use: Never used  Substance and Sexual Activity  . Alcohol use: Never  . Drug use: Never  . Sexual activity: Not Currently    Birth control/protection: None  Other Topics Concern  . Not on file  Social History Narrative   Lives with daughter   Social Determinants of Health   Financial Resource Strain:   . Difficulty of Paying Living Expenses: Not on file  Food Insecurity:   . Worried About Charity fundraiser in the Last Year: Not on file  . Ran Out of Food in the Last Year: Not on file  Transportation Needs:   . Lack of Transportation (Medical): Not on file  . Lack of Transportation (Non-Medical): Not on file  Physical Activity:   . Days of Exercise per Week: Not on file  . Minutes of Exercise per Session: Not on file  Stress:   . Feeling of Stress : Not on file  Social Connections:   . Frequency of Communication with Friends and Family: Not on file  . Frequency of Social Gatherings with Friends and Family: Not on file  . Attends Religious Services: Not on file  . Active Member of Clubs or Organizations: Not on file  . Attends Archivist Meetings: Not on file  . Marital Status: Not on file   Intimate Partner Violence:   . Fear of Current or Ex-Partner: Not on file  . Emotionally Abused: Not on file  . Physically Abused: Not on file  . Sexually Abused: Not on file    FAMILY HISTORY: Family History  Problem Relation Age of Onset  . Colon cancer Brother 31       exposure to chemicals [place]  . Hypertension Mother   . Stroke Mother   . Kidney failure Father   . Breast cancer Neg Hx   . Ovarian cancer Neg Hx     ALLERGIES:  is allergic to sulfamethoxazole-trimethoprim.  MEDICATIONS:  Current Outpatient Medications  Medication Sig Dispense Refill  . apixaban (ELIQUIS) 2.5 MG TABS tablet Take 1 tablet (2.5 mg total) by mouth 2 (two) times daily. 60 tablet 1  . Cholecalciferol (VITAMIN D3) 2000 units capsule Take 2,000 Units by mouth daily.    . diclofenac sodium (VOLTAREN) 1 % GEL Apply 2 g topically 4 (four) times daily as needed (joint pain).   11  . docusate sodium (COLACE) 100 MG capsule Take 100 mg by mouth 2 (two) times daily.    . fexofenadine (ALLEGRA) 180 MG tablet Take 180 mg by mouth  daily.    . fluticasone (FLONASE) 50 MCG/ACT nasal spray Place 1 spray into both nostrils 2 (two) times a day.     . ipratropium (ATROVENT) 0.03 % nasal spray     . lidocaine-prilocaine (EMLA) cream Apply 1 application topically as needed. 30 g 6  . loperamide (IMODIUM) 2 MG capsule Take 1 capsule (2 mg total) by mouth See admin instructions. With onset of loose stool, take $RemoveBef'4mg'eBseJxdCtu$  followed by $RemoveBef'2mg'UXWFKUKikR$  every 2 hours,  Maximum: 16 mg/day 120 capsule 1  . Multiple Vitamins-Minerals (ONE-A-DAY WOMENS 50 PLUS PO) Take 1 tablet by mouth daily.     . Ospemifene (OSPHENA) 60 MG TABS Take 1 tablet by mouth daily. 30 tablet 2  . potassium chloride SA (K-DUR) 20 MEQ tablet Take 20 mEq by mouth daily.     . simvastatin (ZOCOR) 40 MG tablet Take 40 mg by mouth at bedtime.     . triamterene-hydrochlorothiazide (DYAZIDE) 37.5-25 MG capsule Take 1 capsule by mouth daily.      No current  facility-administered medications for this visit.     PHYSICAL EXAMINATION: ECOG PERFORMANCE STATUS: 1 - Symptomatic but completely ambulatory Vitals:   09/16/20 1049  BP: 110/71  Pulse: 79  Resp: 16  Temp: 98.6 F (37 C)  SpO2: 100%   Filed Weights   09/16/20 1049  Weight: 166 lb 3.2 oz (75.4 kg)    Physical Exam Constitutional:      General: She is not in acute distress. HENT:     Head: Normocephalic and atraumatic.  Eyes:     General: No scleral icterus. Cardiovascular:     Rate and Rhythm: Normal rate and regular rhythm.     Heart sounds: Normal heart sounds.  Pulmonary:     Effort: Pulmonary effort is normal. No respiratory distress.     Breath sounds: No wheezing.  Abdominal:     General: Bowel sounds are normal. There is no distension.     Palpations: Abdomen is soft.     Comments: + Colostomy bag   Musculoskeletal:        General: No deformity. Normal range of motion.     Cervical back: Normal range of motion and neck supple.  Skin:    General: Skin is warm and dry.     Findings: No erythema or rash.  Neurological:     Mental Status: She is alert and oriented to person, place, and time. Mental status is at baseline.     Cranial Nerves: No cranial nerve deficit.     Coordination: Coordination normal.  Psychiatric:        Mood and Affect: Mood normal.    CMP Latest Ref Rng & Units 09/02/2020  Glucose 70 - 99 mg/dL 91  BUN 8 - 23 mg/dL 10  Creatinine 0.44 - 1.00 mg/dL 0.72  Sodium 135 - 145 mmol/L 138  Potassium 3.5 - 5.1 mmol/L 3.8  Chloride 98 - 111 mmol/L 99  CO2 22 - 32 mmol/L 30  Calcium 8.9 - 10.3 mg/dL 9.5  Total Protein 6.5 - 8.1 g/dL 7.1  Total Bilirubin 0.3 - 1.2 mg/dL 0.5  Alkaline Phos 38 - 126 U/L 35(L)  AST 15 - 41 U/L 28  ALT 0 - 44 U/L 14   CBC Latest Ref Rng & Units 09/02/2020  WBC 4.0 - 10.5 K/uL 3.6(L)  Hemoglobin 12.0 - 15.0 g/dL 11.3(L)  Hematocrit 36 - 46 % 35.4(L)  Platelets 150 - 400 K/uL 227    LABORATORY DATA:  I  have reviewed the data as listed Lab Results  Component Value Date   WBC 3.6 (L) 09/02/2020   HGB 11.3 (L) 09/02/2020   HCT 35.4 (L) 09/02/2020   MCV 94.9 09/02/2020   PLT 227 09/02/2020   Recent Labs    03/19/20 0826 03/19/20 0826 06/23/20 1124 06/23/20 1124 07/16/20 0802 07/23/20 1345 08/17/20 1007 08/24/20 1014 09/02/20 1028  NA 142   < > 140   < > 140   < > 139 139 138  K 4.1   < > 3.7   < > 3.2*   < > 3.7 4.1 3.8  CL 102   < > 100   < > 101   < > 101 99 99  CO2 31   < > 30   < > 29   < > $R'29 31 30  'CE$ GLUCOSE 95   < > 114*   < > 114*   < > 80 98 91  BUN 12   < > 10   < > 9   < > $R'11 12 10  'cG$ CREATININE 1.01*   < > 0.97   < > 0.87   < > 0.92 0.97 0.72  CALCIUM 9.9   < > 9.1   < > 9.2   < > 9.4 9.5 9.5  GFRNONAA 56*   < > 58*   < > >60   < > >60 >60 >60  GFRAA >60  --  >60  --  >60  --   --   --   --   PROT 7.5   < > 7.0   < > 6.9   < > 7.1 7.4 7.1  ALBUMIN 4.4   < > 4.0   < > 3.6   < > 4.0 4.2 4.1  AST 25   < > 25   < > 24   < > $R'30 24 28  'dU$ ALT 16   < > 15   < > 14   < > $R'16 14 14  'HO$ ALKPHOS 37*   < > 41   < > 41   < > 38 38 35*  BILITOT 0.8   < > 0.4   < > 0.5   < > 0.5 0.4 0.5   < > = values in this interval not displayed.   Iron/TIBC/Ferritin/ %Sat    Component Value Date/Time   IRON 40 06/23/2020 1124   TIBC 246 (L) 06/23/2020 1124   FERRITIN 338 (H) 06/23/2020 1124   IRONPCTSAT 16 06/23/2020 1124     RADIOGRAPHIC STUDIES: I have personally reviewed the radiological images as listed and agreed with the findings in the report. CT CHEST ABDOMEN PELVIS W CONTRAST  Result Date: 06/25/2020 CLINICAL DATA:  History of rectal cancer and vaginal bleeding with elevated CEA EXAM: CT CHEST, ABDOMEN, AND PELVIS WITH CONTRAST TECHNIQUE: Multidetector CT imaging of the chest, abdomen and pelvis was performed following the standard protocol during bolus administration of intravenous contrast. CONTRAST:  23mL OMNIPAQUE IOHEXOL 300 MG/ML  SOLN COMPARISON:  Feb 25, 2020 FINDINGS: CT CHEST  FINDINGS Cardiovascular: RIGHT-sided Port-A-Cath terminates in the RIGHT atrium as before. Heart size is stable without pericardial effusion. Engorgement of central pulmonary vasculature slightly greater than 3 cm. Mediastinum/Nodes: Thoracic inlet structures are normal. No axillary lymphadenopathy. No mediastinal lymphadenopathy. No hilar lymphadenopathy. Esophagus grossly normal. Lungs/Pleura: No suspicious pulmonary mass or nodule. Airways are patent. Tiny nodule along the fissure in the RIGHT lower lobe is stable at  approximately 2-3 mm. The signs of lingular and RIGHT middle lobe scarring. Musculoskeletal: See below for full musculoskeletal detail. Spinal degenerative changes. No acute process involving the bony thorax. CT ABDOMEN PELVIS FINDINGS Hepatobiliary: No focal, suspicious hepatic lesion. Post cholecystectomy with less biliary duct distension that was evident on the prior study. Tiny hypodensity in the RIGHT hepatic lobe is stable. The portal vein is patent. Pancreas: Pancreas without inflammation or focal lesion. Mild ductal distension unchanged. Spleen: Spleen normal in size and contour without focal lesion. Adrenals/Urinary Tract: Adrenal glands are normal. Cysts of the bilateral kidneys are unchanged. No hydronephrosis. Urinary bladder under distended. Stomach/Bowel: Signs of APR with LEFT lower quadrant colostomy. Fluid density along the RIGHT lateral margin of the colon in the ostomy has increased slowly over time. To a lesser extent this is true along the LEFT lateral margin. There is no stranding about the colon as it passes through the abdominal wall. Greatest thickness of fluid at the level of the ostomy approximately 2 cm, previously approximately 1.4 cm. Vascular/Lymphatic: Calcified and noncalcified atheromatous plaque of the abdominal aorta. There is no gastrohepatic or hepatoduodenal ligament lymphadenopathy. No retroperitoneal or mesenteric lymphadenopathy. No pelvic sidewall  lymphadenopathy. Reproductive: Increasing fullness about the vaginal introitus at the pelvic floor with masslike appearance suggested in this location best seen on image 118 of series 2 and image 73 of series 4 this is of uncertain significance but suspicious certainly more pronounced than in 12/11/2018 and more pronounced than on the prior exam Other: No peritoneal nodularity or ascites. Musculoskeletal: No acute bone process. No destructive bone finding. Spinal degenerative changes. IMPRESSION: 1. More masslike appearance of the vaginal introitus and lower vagina. The possibility of disease in this area is considered, this is likely amenable to direct clinical inspection for signs of mass. 2. Fluid density about the colon slightly increasing over time. The possibility disease in this area is not excluded as there is a lack of significant colonic inflammation in this location. Focused ultrasound and aspiration could be performed though findings about the vagina are more suspicious particularly given the report of close vaginal margins and the irregular appearance on CT that is definitely changed since the baseline CT evaluation of 12-11-18 3. No findings to suggest metastatic disease to the chest. 4. Engorgement of central pulmonary vasculature slightly greater than 3 cm, raising the question of pulmonary arterial hypertension. 5. Stable biliary and pancreatic ductal distension. 6. Aortic atherosclerosis. These results will be called to the ordering clinician or representative by the Radiologist Assistant, and communication documented in the PACS or [employer]. Aortic Atherosclerosis (ICD10-I70.0). Electronically Signed   By: Zetta Bills M.D.   On: 06/25/2020 15:42   NM PET Image Restag (PS) Skull Base To Thigh  Result Date: 07/20/2020 CLINICAL DATA:  Subsequent treatment strategy for rectal cancer. Chemotherapy last Friday. Radiation therapy most recent 1 year ago. Vaginal biopsy 1 week  ago, demonstrating involvement by colorectal adenocarcinoma. History of rectovaginal fistula. COVID vaccine x2 most recent 12/23/2019. EXAM: NUCLEAR MEDICINE PET SKULL BASE TO THIGH TECHNIQUE: 8.8 mCi F-18 FDG was injected intravenously. Full-ring PET imaging was performed from the skull base to thigh after the radiotracer. CT data was obtained and used for attenuation correction and anatomic localization. Fasting blood glucose: 86 mg/dl COMPARISON:  CTs 06/25/2020. FINDINGS: Mediastinal blood pool activity: SUV max 2.1 Liver activity: SUV max NA NECK: No areas of abnormal hypermetabolism. Incidental CT findings: No cervical adenopathy. CHEST: No pulmonary parenchymal or thoracic  nodal hypermetabolism. Incidental CT findings: Deferred to recent diagnostic CT. Right Port-A-Cath tip mid to low right atrium. Aortic and coronary artery atherosclerosis. ABDOMEN/PELVIS: No abdominopelvic nodal hypermetabolism. There is hypermetabolism at the descending colon along its peripheral course through the ostomy (status post abdominal perineal resection). Example at a S.U.V. max of 5.0 on 184/3. This is separate from the right-sided fluid described on the prior diagnostic CT, which is relatively similar at maximally 2.1 cm on 181/3. Circumferential wall thickening and hypermetabolism within the vaginal introitus. Example at a S.U.V. max of 12.3 on 236/3. Incidental CT findings: Deferred to recent diagnostic CT. Low-density bilateral renal lesions are likely cysts. Right abdominal wall hernia repair. Hysterectomy. Scattered colonic diverticula. SKELETON: No abnormal marrow activity. Incidental CT findings: Osteopenia IMPRESSION: 1. Circumferential wall thickening and hypermetabolism involving the vaginal introitus, consistent with the recent biopsy results of metastatic colorectal carcinoma. 2. Status post abdominal perineal resection with left abdominal ostomy. Hypermetabolism within the colon within the ostomy could be  physiologic. This should be amenable to physical exam correlation to exclude residual/recurrent disease. 3. No other evidence of hypermetabolic metastasis. 4. Incidental findings, including: Aortic atherosclerosis (ICD10-I70.0) and emphysema (ICD10-J43.9). Electronically Signed   By: Abigail Miyamoto M.D.   On: 07/20/2020 14:15    CT CHEST ABDOMEN PELVIS W CONTRAST  Result Date: 06/25/2020 CLINICAL DATA:  History of rectal cancer and vaginal bleeding with elevated CEA EXAM: CT CHEST, ABDOMEN, AND PELVIS WITH CONTRAST TECHNIQUE: Multidetector CT imaging of the chest, abdomen and pelvis was performed following the standard protocol during bolus administration of intravenous contrast. CONTRAST:  60mL OMNIPAQUE IOHEXOL 300 MG/ML  SOLN COMPARISON:  Feb 25, 2020 FINDINGS: CT CHEST FINDINGS Cardiovascular: RIGHT-sided Port-A-Cath terminates in the RIGHT atrium as before. Heart size is stable without pericardial effusion. Engorgement of central pulmonary vasculature slightly greater than 3 cm. Mediastinum/Nodes: Thoracic inlet structures are normal. No axillary lymphadenopathy. No mediastinal lymphadenopathy. No hilar lymphadenopathy. Esophagus grossly normal. Lungs/Pleura: No suspicious pulmonary mass or nodule. Airways are patent. Tiny nodule along the fissure in the RIGHT lower lobe is stable at approximately 2-3 mm. The signs of lingular and RIGHT middle lobe scarring. Musculoskeletal: See below for full musculoskeletal detail. Spinal degenerative changes. No acute process involving the bony thorax. CT ABDOMEN PELVIS FINDINGS Hepatobiliary: No focal, suspicious hepatic lesion. Post cholecystectomy with less biliary duct distension that was evident on the prior study. Tiny hypodensity in the RIGHT hepatic lobe is stable. The portal vein is patent. Pancreas: Pancreas without inflammation or focal lesion. Mild ductal distension unchanged. Spleen: Spleen normal in size and contour without focal lesion. Adrenals/Urinary  Tract: Adrenal glands are normal. Cysts of the bilateral kidneys are unchanged. No hydronephrosis. Urinary bladder under distended. Stomach/Bowel: Signs of APR with LEFT lower quadrant colostomy. Fluid density along the RIGHT lateral margin of the colon in the ostomy has increased slowly over time. To a lesser extent this is true along the LEFT lateral margin. There is no stranding about the colon as it passes through the abdominal wall. Greatest thickness of fluid at the level of the ostomy approximately 2 cm, previously approximately 1.4 cm. Vascular/Lymphatic: Calcified and noncalcified atheromatous plaque of the abdominal aorta. There is no gastrohepatic or hepatoduodenal ligament lymphadenopathy. No retroperitoneal or mesenteric lymphadenopathy. No pelvic sidewall lymphadenopathy. Reproductive: Increasing fullness about the vaginal introitus at the pelvic floor with masslike appearance suggested in this location best seen on image 118 of series 2 and image 73 of series 4 this is of uncertain significance but  suspicious certainly more pronounced than in 2018-12-21 and more pronounced than on the prior exam Other: No peritoneal nodularity or ascites. Musculoskeletal: No acute bone process. No destructive bone finding. Spinal degenerative changes. IMPRESSION: 1. More masslike appearance of the vaginal introitus and lower vagina. The possibility of disease in this area is considered, this is likely amenable to direct clinical inspection for signs of mass. 2. Fluid density about the colon slightly increasing over time. The possibility disease in this area is not excluded as there is a lack of significant colonic inflammation in this location. Focused ultrasound and aspiration could be performed though findings about the vagina are more suspicious particularly given the report of close vaginal margins and the irregular appearance on CT that is definitely changed since the baseline CT evaluation of 12-21-18 3. No findings to suggest metastatic disease to the chest. 4. Engorgement of central pulmonary vasculature slightly greater than 3 cm, raising the question of pulmonary arterial hypertension. 5. Stable biliary and pancreatic ductal distension. 6. Aortic atherosclerosis. These results will be called to the ordering clinician or representative by the Radiologist Assistant, and communication documented in the PACS or [employer]. Aortic Atherosclerosis (ICD10-I70.0). Electronically Signed   By: Zetta Bills M.D.   On: 06/25/2020 15:42   NM PET Image Restag (PS) Skull Base To Thigh  Result Date: 07/20/2020 CLINICAL DATA:  Subsequent treatment strategy for rectal cancer. Chemotherapy last Friday. Radiation therapy most recent 1 year ago. Vaginal biopsy 1 week ago, demonstrating involvement by colorectal adenocarcinoma. History of rectovaginal fistula. COVID vaccine x2 most recent 12/23/2019. EXAM: NUCLEAR MEDICINE PET SKULL BASE TO THIGH TECHNIQUE: 8.8 mCi F-18 FDG was injected intravenously. Full-ring PET imaging was performed from the skull base to thigh after the radiotracer. CT data was obtained and used for attenuation correction and anatomic localization. Fasting blood glucose: 86 mg/dl COMPARISON:  CTs 06/25/2020. FINDINGS: Mediastinal blood pool activity: SUV max 2.1 Liver activity: SUV max NA NECK: No areas of abnormal hypermetabolism. Incidental CT findings: No cervical adenopathy. CHEST: No pulmonary parenchymal or thoracic nodal hypermetabolism. Incidental CT findings: Deferred to recent diagnostic CT. Right Port-A-Cath tip mid to low right atrium. Aortic and coronary artery atherosclerosis. ABDOMEN/PELVIS: No abdominopelvic nodal hypermetabolism. There is hypermetabolism at the descending colon along its peripheral course through the ostomy (status post abdominal perineal resection). Example at a S.U.V. max of 5.0 on 184/3. This is separate from the right-sided fluid described on the prior  diagnostic CT, which is relatively similar at maximally 2.1 cm on 181/3. Circumferential wall thickening and hypermetabolism within the vaginal introitus. Example at a S.U.V. max of 12.3 on 236/3. Incidental CT findings: Deferred to recent diagnostic CT. Low-density bilateral renal lesions are likely cysts. Right abdominal wall hernia repair. Hysterectomy. Scattered colonic diverticula. SKELETON: No abnormal marrow activity. Incidental CT findings: Osteopenia IMPRESSION: 1. Circumferential wall thickening and hypermetabolism involving the vaginal introitus, consistent with the recent biopsy results of metastatic colorectal carcinoma. 2. Status post abdominal perineal resection with left abdominal ostomy. Hypermetabolism within the colon within the ostomy could be physiologic. This should be amenable to physical exam correlation to exclude residual/recurrent disease. 3. No other evidence of hypermetabolic metastasis. 4. Incidental findings, including: Aortic atherosclerosis (ICD10-I70.0) and emphysema (ICD10-J43.9). Electronically Signed   By: Abigail Miyamoto M.D.   On: 07/20/2020 14:15    ASSESSMENT & PLAN:  1. Rectal cancer ([place])   2. History of pulmonary embolism   Cancer Staging Rectal cancer [facility]) Staging form: Colon  and Rectum, AJCC 8th Edition - Clinical stage from 01/23/2019: Stage IIIC (cT4b, cN1a, cM0) - Signed by Earlie Server, MD on 01/23/2019 - Pathologic stage from 10/06/2019: Stage IIC (ypT4b, pN0, cM0) - Signed by Earlie Server, MD on 10/06/2019  #History of stage IIIC Rectal cancer, s/p TNT, followed by 09/17/19 APR/posterior vaginectomy/TAH/BSO/VY-flap pT4b pN0 with close vaginal margin 0.2 mm.  Uterus and ovaries negative for malignancy. Finished palliative radiation to vaginal recurrence.  Finished 09/04/2020. Clinically she is doing very well.  Symptom has improved. I discussed with patient and also with patient's daughter over the phone that further adjuvant systemic chemotherapy benefit is not  very well studied and I recommend patient to seek a second opinion at [facility] oncology.  Patient prefers to defer second opinion to repeat scan. I will check CEA today and a repeat CEA in 6 weeks. I will repeat a CT chest abdomen pelvis in 6 weeks for evaluation of treatment response.  #History of acute pulmonary embolism, bilateral DVT, unprovoked, continue Eliquis 2.5 mg twice daily.   Follow-up early January.    Earlie Server, MD, PhD

## 2020-09-17 LAB — CEA: CEA: 338 ng/mL — ABNORMAL HIGH (ref 0.0–4.7)

## 2020-09-20 DIAGNOSIS — Z933 Colostomy status: Secondary | ICD-10-CM | POA: Diagnosis not present

## 2020-09-21 ENCOUNTER — Other Ambulatory Visit: Payer: Self-pay

## 2020-09-21 ENCOUNTER — Encounter: Payer: Self-pay | Admitting: Podiatry

## 2020-09-21 ENCOUNTER — Ambulatory Visit: Payer: Medicare Other | Admitting: Podiatry

## 2020-09-21 DIAGNOSIS — M79675 Pain in left toe(s): Secondary | ICD-10-CM | POA: Diagnosis not present

## 2020-09-21 DIAGNOSIS — M79674 Pain in right toe(s): Secondary | ICD-10-CM | POA: Diagnosis not present

## 2020-09-21 DIAGNOSIS — B351 Tinea unguium: Secondary | ICD-10-CM

## 2020-09-21 DIAGNOSIS — D689 Coagulation defect, unspecified: Secondary | ICD-10-CM

## 2020-09-21 NOTE — Progress Notes (Signed)
  Subjective:  Patient ID: Jacqueline Yoder, female    DOB: December 04, 1945,  MRN: [id number]  Chief Complaint  Patient presents with  . Nail Problem    nail trim RFC   74 y.o. female returns for the above complaint.  Patient presents with thickened elongated dystrophic toenails x10.  Patient states painful to touch.  She is not able to debride herself.  She would like for me to do it.  She denies any other acute complaints.  She states that she is on Eliquis from previous history of PE.  Objective:  There were no vitals filed for this visit. Podiatric Exam: Vascular: dorsalis pedis and posterior tibial pulses are palpable bilateral. Capillary return is immediate. Temperature gradient is WNL. Skin turgor WNL  Sensorium: Normal Semmes Weinstein monofilament test. Normal tactile sensation bilaterally. Nail Exam: Pt has thick disfigured discolored nails with subungual debris noted bilateral entire nail hallux through fifth toenails.  Pain on palpation to the nails. Ulcer Exam: There is no evidence of ulcer or pre-ulcerative changes or infection. Orthopedic Exam: Muscle tone and strength are WNL. No limitations in general ROM. No crepitus or effusions noted. HAV  B/L.  Hammer toes 2-5  B/L. Skin: No Porokeratosis. No infection or ulcers    Assessment & Plan:   1. Coagulation defect ([place])   2. Pain due to onychomycosis of toenails of both feet     Patient was evaluated and treated and all questions answered.  Onychomycosis with pain  -Nails palliatively debrided as below. -Educated on self-care  Procedure: Nail Debridement Rationale: pain  Type of Debridement: manual, sharp debridement. Instrumentation: Nail nipper, rotary burr. Number of Nails: 10  Procedures and Treatment: Consent by patient was obtained for treatment procedures. The patient understood the discussion of treatment and procedures well. All questions were answered thoroughly reviewed. Debridement of mycotic and hypertrophic  toenails, 1 through 5 bilateral and clearing of subungual debris. No ulceration, no infection noted.  Return Visit-Office Procedure: Patient instructed to return to the office for a follow up visit 3 months for continued evaluation and treatment.  Boneta Lucks, DPM    No follow-ups on file.

## 2020-09-23 DIAGNOSIS — Z933 Colostomy status: Secondary | ICD-10-CM | POA: Diagnosis not present

## 2020-09-27 ENCOUNTER — Ambulatory Visit: Payer: Medicare Other

## 2020-10-11 ENCOUNTER — Other Ambulatory Visit: Payer: Self-pay

## 2020-10-11 ENCOUNTER — Inpatient Hospital Stay: Payer: Medicare Other

## 2020-10-11 DIAGNOSIS — Z9221 Personal history of antineoplastic chemotherapy: Secondary | ICD-10-CM | POA: Diagnosis not present

## 2020-10-11 DIAGNOSIS — C20 Malignant neoplasm of rectum: Secondary | ICD-10-CM | POA: Diagnosis not present

## 2020-10-11 DIAGNOSIS — I10 Essential (primary) hypertension: Secondary | ICD-10-CM | POA: Diagnosis not present

## 2020-10-11 DIAGNOSIS — Z923 Personal history of irradiation: Secondary | ICD-10-CM | POA: Diagnosis not present

## 2020-10-11 DIAGNOSIS — K219 Gastro-esophageal reflux disease without esophagitis: Secondary | ICD-10-CM | POA: Diagnosis not present

## 2020-10-11 DIAGNOSIS — R609 Edema, unspecified: Secondary | ICD-10-CM | POA: Diagnosis not present

## 2020-10-11 DIAGNOSIS — Z87891 Personal history of nicotine dependence: Secondary | ICD-10-CM | POA: Diagnosis not present

## 2020-10-11 DIAGNOSIS — Z86718 Personal history of other venous thrombosis and embolism: Secondary | ICD-10-CM | POA: Diagnosis not present

## 2020-10-11 DIAGNOSIS — Z7901 Long term (current) use of anticoagulants: Secondary | ICD-10-CM | POA: Diagnosis not present

## 2020-10-11 DIAGNOSIS — Z95828 Presence of other vascular implants and grafts: Secondary | ICD-10-CM

## 2020-10-11 DIAGNOSIS — Z800 Family history of malignant neoplasm of digestive organs: Secondary | ICD-10-CM | POA: Diagnosis not present

## 2020-10-11 DIAGNOSIS — R634 Abnormal weight loss: Secondary | ICD-10-CM | POA: Diagnosis not present

## 2020-10-11 DIAGNOSIS — Z452 Encounter for adjustment and management of vascular access device: Secondary | ICD-10-CM | POA: Diagnosis not present

## 2020-10-11 DIAGNOSIS — Z86711 Personal history of pulmonary embolism: Secondary | ICD-10-CM | POA: Diagnosis not present

## 2020-10-11 DIAGNOSIS — E7800 Pure hypercholesterolemia, unspecified: Secondary | ICD-10-CM | POA: Diagnosis not present

## 2020-10-11 DIAGNOSIS — Z79899 Other long term (current) drug therapy: Secondary | ICD-10-CM | POA: Diagnosis not present

## 2020-10-11 MED ORDER — SODIUM CHLORIDE 0.9% FLUSH
10.0000 mL | INTRAVENOUS | Status: DC | PRN
  Administered 2020-10-11: 14:00:00 10 mL via INTRAVENOUS
  Filled 2020-10-11: qty 10

## 2020-10-11 MED ORDER — HEPARIN SOD (PORK) LOCK FLUSH 100 UNIT/ML IV SOLN
500.0000 [IU] | Freq: Once | INTRAVENOUS | Status: AC
  Administered 2020-10-11: 14:00:00 500 [IU] via INTRAVENOUS
  Filled 2020-10-11: qty 5

## 2020-10-11 MED ORDER — HEPARIN SOD (PORK) LOCK FLUSH 100 UNIT/ML IV SOLN
INTRAVENOUS | Status: AC
  Filled 2020-10-11: qty 5

## 2020-10-20 ENCOUNTER — Other Ambulatory Visit: Payer: Self-pay

## 2020-10-20 ENCOUNTER — Inpatient Hospital Stay: Payer: Medicare Other | Attending: Oncology

## 2020-10-20 ENCOUNTER — Ambulatory Visit
Admission: RE | Admit: 2020-10-20 | Discharge: 2020-10-20 | Disposition: A | Discharge status: Home / Self Care | Payer: Medicare Other | Source: Ambulatory Visit | Attending: Oncology | Admitting: Oncology

## 2020-10-20 DIAGNOSIS — Z800 Family history of malignant neoplasm of digestive organs: Secondary | ICD-10-CM | POA: Diagnosis not present

## 2020-10-20 DIAGNOSIS — Z791 Long term (current) use of non-steroidal anti-inflammatories (NSAID): Secondary | ICD-10-CM | POA: Insufficient documentation

## 2020-10-20 DIAGNOSIS — Z933 Colostomy status: Secondary | ICD-10-CM | POA: Insufficient documentation

## 2020-10-20 DIAGNOSIS — I700 Atherosclerosis of aorta: Secondary | ICD-10-CM | POA: Insufficient documentation

## 2020-10-20 DIAGNOSIS — K5900 Constipation, unspecified: Secondary | ICD-10-CM | POA: Insufficient documentation

## 2020-10-20 DIAGNOSIS — Z86718 Personal history of other venous thrombosis and embolism: Secondary | ICD-10-CM | POA: Diagnosis not present

## 2020-10-20 DIAGNOSIS — R634 Abnormal weight loss: Secondary | ICD-10-CM | POA: Diagnosis not present

## 2020-10-20 DIAGNOSIS — R970 Elevated carcinoembryonic antigen [CEA]: Secondary | ICD-10-CM | POA: Diagnosis not present

## 2020-10-20 DIAGNOSIS — C20 Malignant neoplasm of rectum: Secondary | ICD-10-CM | POA: Insufficient documentation

## 2020-10-20 DIAGNOSIS — Z87891 Personal history of nicotine dependence: Secondary | ICD-10-CM | POA: Insufficient documentation

## 2020-10-20 DIAGNOSIS — I10 Essential (primary) hypertension: Secondary | ICD-10-CM | POA: Diagnosis not present

## 2020-10-20 DIAGNOSIS — R609 Edema, unspecified: Secondary | ICD-10-CM | POA: Insufficient documentation

## 2020-10-20 DIAGNOSIS — E7800 Pure hypercholesterolemia, unspecified: Secondary | ICD-10-CM | POA: Insufficient documentation

## 2020-10-20 DIAGNOSIS — Z7901 Long term (current) use of anticoagulants: Secondary | ICD-10-CM | POA: Diagnosis not present

## 2020-10-20 DIAGNOSIS — R918 Other nonspecific abnormal finding of lung field: Secondary | ICD-10-CM | POA: Insufficient documentation

## 2020-10-20 DIAGNOSIS — M129 Arthropathy, unspecified: Secondary | ICD-10-CM | POA: Insufficient documentation

## 2020-10-20 DIAGNOSIS — Z85048 Personal history of other malignant neoplasm of rectum, rectosigmoid junction, and anus: Secondary | ICD-10-CM | POA: Diagnosis not present

## 2020-10-20 DIAGNOSIS — R911 Solitary pulmonary nodule: Secondary | ICD-10-CM | POA: Diagnosis not present

## 2020-10-20 DIAGNOSIS — R202 Paresthesia of skin: Secondary | ICD-10-CM | POA: Diagnosis not present

## 2020-10-20 DIAGNOSIS — N281 Cyst of kidney, acquired: Secondary | ICD-10-CM | POA: Diagnosis not present

## 2020-10-20 DIAGNOSIS — I2510 Atherosclerotic heart disease of native coronary artery without angina pectoris: Secondary | ICD-10-CM | POA: Diagnosis not present

## 2020-10-20 DIAGNOSIS — C541 Malignant neoplasm of endometrium: Secondary | ICD-10-CM | POA: Diagnosis not present

## 2020-10-20 DIAGNOSIS — K5730 Diverticulosis of large intestine without perforation or abscess without bleeding: Secondary | ICD-10-CM | POA: Diagnosis not present

## 2020-10-20 DIAGNOSIS — Z86711 Personal history of pulmonary embolism: Secondary | ICD-10-CM | POA: Diagnosis not present

## 2020-10-20 DIAGNOSIS — Z79899 Other long term (current) drug therapy: Secondary | ICD-10-CM | POA: Insufficient documentation

## 2020-10-20 DIAGNOSIS — K219 Gastro-esophageal reflux disease without esophagitis: Secondary | ICD-10-CM | POA: Diagnosis not present

## 2020-10-20 DIAGNOSIS — R200 Anesthesia of skin: Secondary | ICD-10-CM | POA: Diagnosis not present

## 2020-10-20 LAB — CBC WITH DIFFERENTIAL/PLATELET
Abs Immature Granulocytes: 0.01 10*3/uL (ref 0.00–0.07)
Basophils Absolute: 0 10*3/uL (ref 0.0–0.1)
Basophils Relative: 1 %
Eosinophils Absolute: 0.1 10*3/uL (ref 0.0–0.5)
Eosinophils Relative: 3 %
HCT: 35.8 % — ABNORMAL LOW (ref 36.0–46.0)
Hemoglobin: 11.5 g/dL — ABNORMAL LOW (ref 12.0–15.0)
Immature Granulocytes: 0 %
Lymphocytes Relative: 31 %
Lymphs Abs: 1 10*3/uL (ref 0.7–4.0)
MCH: 30.7 pg (ref 26.0–34.0)
MCHC: 32.1 g/dL (ref 30.0–36.0)
MCV: 95.5 fL (ref 80.0–100.0)
Monocytes Absolute: 0.3 10*3/uL (ref 0.1–1.0)
Monocytes Relative: 8 %
Neutro Abs: 1.8 10*3/uL (ref 1.7–7.7)
Neutrophils Relative %: 57 %
Platelets: 175 10*3/uL (ref 150–400)
RBC: 3.75 MIL/uL — ABNORMAL LOW (ref 3.87–5.11)
RDW: 15.2 % (ref 11.5–15.5)
WBC: 3.1 10*3/uL — ABNORMAL LOW (ref 4.0–10.5)
nRBC: 0 % (ref 0.0–0.2)

## 2020-10-20 LAB — COMPREHENSIVE METABOLIC PANEL
ALT: 19 U/L (ref 0–44)
AST: 34 U/L (ref 15–41)
Albumin: 4.1 g/dL (ref 3.5–5.0)
Alkaline Phosphatase: 45 U/L (ref 38–126)
Anion gap: 11 (ref 5–15)
BUN: 10 mg/dL (ref 8–23)
CO2: 27 mmol/L (ref 22–32)
Calcium: 9.3 mg/dL (ref 8.9–10.3)
Chloride: 98 mmol/L (ref 98–111)
Creatinine, Ser: 0.85 mg/dL (ref 0.44–1.00)
GFR, Estimated: >60 mL/min (ref >60)
Glucose, Bld: 97 mg/dL (ref 70–99)
Potassium: 3.6 mmol/L (ref 3.5–5.1)
Sodium: 136 mmol/L (ref 135–145)
Total Bilirubin: 0.7 mg/dL (ref 0.3–1.2)
Total Protein: 6.9 g/dL (ref 6.5–8.1)

## 2020-10-20 MED ORDER — IOHEXOL 300 MG/ML  SOLN
100.0000 mL | Freq: Once | INTRAMUSCULAR | Status: AC | PRN
  Administered 2020-10-20: 100 mL via INTRAVENOUS

## 2020-10-21 ENCOUNTER — Other Ambulatory Visit: Payer: Self-pay

## 2020-10-21 ENCOUNTER — Encounter: Payer: Self-pay | Admitting: Radiation Oncology

## 2020-10-21 ENCOUNTER — Ambulatory Visit
Admission: RE | Admit: 2020-10-21 | Discharge: 2020-10-21 | Disposition: A | Discharge status: Home / Self Care | Payer: Medicare Other | Source: Ambulatory Visit | Attending: Radiation Oncology | Admitting: Radiation Oncology

## 2020-10-21 VITALS — BP 121/62 | HR 67 | Temp 96.5°F | Resp 16 | Wt 170.1 lb

## 2020-10-21 DIAGNOSIS — Z923 Personal history of irradiation: Secondary | ICD-10-CM | POA: Diagnosis not present

## 2020-10-21 DIAGNOSIS — C20 Malignant neoplasm of rectum: Secondary | ICD-10-CM | POA: Diagnosis not present

## 2020-10-21 DIAGNOSIS — R918 Other nonspecific abnormal finding of lung field: Secondary | ICD-10-CM | POA: Diagnosis not present

## 2020-10-21 LAB — CEA: CEA: 931 ng/mL — ABNORMAL HIGH (ref 0.0–4.7)

## 2020-10-21 NOTE — Progress Notes (Signed)
Radiation Oncology Follow up Note  Name: Jacqueline Yoder   Date:   10/21/2020 MRN:  [id number] DOB: November 22, 1945    This 75 y.o. female presents to the clinic today for 1 month follow-up status post adjuvant radiation therapy to her pelvis in a patient with known stage IIIc (T4BN1 a M0) adenocarcinoma the rectum with recurrence.Marland Kitchen  REFERRING PROVIDER: Lauro Regulus, MD  HPI: Patient is a 75 year old female treated over a year and half ago with neoadjuvant chemoradiation therapy then underwent APR resection with a TAH/BSO with posterior vaginectomy and fistula takedown with ureteral stent placement.  She had moderately differentiated adenocarcinoma with moderate treatment effect.  Tumor invaded through the full-thickness of the rectal wall involved the posterior vagina.  She recently presented with vaginal bleeding and biopsy showed vaginal recurrence and she underwent chemotherapy with FOLFIRI and underwent palliative radiation therapy.  She is now 1 month out she is doing well.  She has no specific abdominal complaints.  No pelvic pain no vaginal discharge.  She had a CT scan yesterday which I have reviewed showing stable surgical changes from the APR with no findings suggest recurrent tumor.  She does have a small enlarging right middle lobe pulmonary nodule and a new 3 mm left upper lobe pulmonary nodule worrisome for pulmonary metastatic disease.  There is extremely small and I would venture to say we will follow those clinically with repeat CT scans.  COMPLICATIONS OF TREATMENT: none  FOLLOW UP COMPLIANCE: keeps appointments   PHYSICAL EXAM:  BP 121/62 (BP Location: Left Arm, Patient Position: Sitting)   Pulse 67   Temp (!) 96.5 F (35.8 C) (Tympanic)   Resp 16   Wt 170 lb 1.6 oz (77.2 kg)   BMI 28.31 kg/m  Well-developed well-nourished patient in NAD. HEENT reveals PERLA, EOMI, discs not visualized.  Oral cavity is clear. No oral mucosal lesions are identified. Neck is clear without  evidence of cervical or supraclavicular adenopathy. Lungs are clear to A&P. Cardiac examination is essentially unremarkable with regular rate and rhythm without murmur rub or thrill. Abdomen is benign with no organomegaly or masses noted. Motor sensory and DTR levels are equal and symmetric in the upper and lower extremities. Cranial nerves II through XII are grossly intact. Proprioception is intact. No peripheral adenopathy or edema is identified. No motor or sensory levels are noted. Crude visual fields are within normal range.  RADIOLOGY RESULTS: CT scans reviewed compatible with above-stated findings  PLAN: Patient is doing well at the present time.  She sees Dr. Cathie Hoops early next week.  My opinion will be we will probably follow her CT scan of her chest since these are extremely small lesions although concerning for metastatic disease.  I have asked to see her back in 4 to 5 months for follow-up.  Patient knows to call with any concerns.  I would like to take this opportunity to thank you for allowing me to participate in the care of your patient.Jacqueline Miller, MD

## 2020-10-22 ENCOUNTER — Encounter: Payer: Self-pay | Admitting: Oncology

## 2020-10-22 ENCOUNTER — Inpatient Hospital Stay (HOSPITAL_BASED_OUTPATIENT_CLINIC_OR_DEPARTMENT_OTHER): Payer: Medicare Other | Admitting: Oncology

## 2020-10-22 VITALS — BP 111/64 | HR 72 | Temp 97.0°F | Resp 16 | Wt 171.1 lb

## 2020-10-22 DIAGNOSIS — R200 Anesthesia of skin: Secondary | ICD-10-CM | POA: Diagnosis not present

## 2020-10-22 DIAGNOSIS — R202 Paresthesia of skin: Secondary | ICD-10-CM | POA: Diagnosis not present

## 2020-10-22 DIAGNOSIS — Z86718 Personal history of other venous thrombosis and embolism: Secondary | ICD-10-CM | POA: Diagnosis not present

## 2020-10-22 DIAGNOSIS — R609 Edema, unspecified: Secondary | ICD-10-CM | POA: Diagnosis not present

## 2020-10-22 DIAGNOSIS — Z791 Long term (current) use of non-steroidal anti-inflammatories (NSAID): Secondary | ICD-10-CM | POA: Diagnosis not present

## 2020-10-22 DIAGNOSIS — K219 Gastro-esophageal reflux disease without esophagitis: Secondary | ICD-10-CM | POA: Diagnosis not present

## 2020-10-22 DIAGNOSIS — K5900 Constipation, unspecified: Secondary | ICD-10-CM | POA: Diagnosis not present

## 2020-10-22 DIAGNOSIS — I700 Atherosclerosis of aorta: Secondary | ICD-10-CM | POA: Diagnosis not present

## 2020-10-22 DIAGNOSIS — Z87891 Personal history of nicotine dependence: Secondary | ICD-10-CM | POA: Diagnosis not present

## 2020-10-22 DIAGNOSIS — Z800 Family history of malignant neoplasm of digestive organs: Secondary | ICD-10-CM | POA: Diagnosis not present

## 2020-10-22 DIAGNOSIS — Z933 Colostomy status: Secondary | ICD-10-CM | POA: Diagnosis not present

## 2020-10-22 DIAGNOSIS — R918 Other nonspecific abnormal finding of lung field: Secondary | ICD-10-CM

## 2020-10-22 DIAGNOSIS — Z79899 Other long term (current) drug therapy: Secondary | ICD-10-CM | POA: Diagnosis not present

## 2020-10-22 DIAGNOSIS — Z86711 Personal history of pulmonary embolism: Secondary | ICD-10-CM

## 2020-10-22 DIAGNOSIS — E7800 Pure hypercholesterolemia, unspecified: Secondary | ICD-10-CM | POA: Diagnosis not present

## 2020-10-22 DIAGNOSIS — I10 Essential (primary) hypertension: Secondary | ICD-10-CM | POA: Diagnosis not present

## 2020-10-22 DIAGNOSIS — R970 Elevated carcinoembryonic antigen [CEA]: Secondary | ICD-10-CM | POA: Diagnosis not present

## 2020-10-22 DIAGNOSIS — R634 Abnormal weight loss: Secondary | ICD-10-CM | POA: Diagnosis not present

## 2020-10-22 DIAGNOSIS — M129 Arthropathy, unspecified: Secondary | ICD-10-CM | POA: Diagnosis not present

## 2020-10-22 DIAGNOSIS — C20 Malignant neoplasm of rectum: Secondary | ICD-10-CM

## 2020-10-22 DIAGNOSIS — Z7901 Long term (current) use of anticoagulants: Secondary | ICD-10-CM | POA: Diagnosis not present

## 2020-10-22 NOTE — Progress Notes (Signed)
Hematology/Oncology progress note [facility] [phone] [phone]   Patient Care Team: Karamalegos, Alexander J, DO as PCP - General (Family Medicine) Stanton, Kristi D, RN as Registered Nurse Tyreona Panjwani, MD as Consulting Physician (Hematology and Oncology)  REFERRING PROVIDER: Dr.Secord CHIEF COMPLAINTS/REASON FOR VISIT:  Follow up for rectal cancer  HISTORY OF PRESENTING ILLNESS:  Jacqueline Yoder is a  74 y.o.  female with PMH listed below who was referred to me for evaluation of endometrial cancer.   Patient initially presented with complaints of postmenopausal bleeding on 08/16/2018.  History of was menopausal vaginal bleeding in 2016 which resulted in cervical polypectomy.  Pathology 02/04/2015 showed cervical polyp, consistent with benign endometrial polyp.  [name] after polypectomy due to anxiety associated with pelvic exams.  pelvic exam on 08/16/2018 reviewed cervical abnormality and from enlarged uterus. Seen by Dr. Cherry on 10/29/2018.  Endometrial biopsy and a Pap smear was performed. 10/29/2018 Pap smear showed adenocarcinoma, favor endometrial origin. 10/29/2018 endometrial biopsy showed endometrioid carcinoma, FIGO grade 1.  10/29/2018- TA & TV Ultrasound revealed: Anteverted uterus measuring 8.7 x 5.6 x 6.4 cm without evidence of focal masses.  The endometrium measuring 24.1 mm (thickened) and heterogeneous.  Right and left ovaries not visualized.  No adnexal masses identified.  No free fluid in cul-de-sac.  Patient was seen by Dr. Secord in clinic on 11/13/2018.  Cervical exam reveals 2 cm exophytic irregular mass consistent with malignancy.   11/19/2018 CT chest abdomen pelvis with contrast showed thickened endometrium with some irregularity compatible with the provided diagnosis of endometrial malignancy.  There is a mildly prominent left inguinal node 1.4 cm.  Patient was seen by Dr. Berchuck on 11/20/2018 and left groin  lymph node biopsy was recommended.  11/26/2018 patient underwent left inguinal lymph node biopsy. Pathology showed metastatic adenocarcinoma consistent with colorectal origin.  CDX 2+.  Case was discussed on tumor board.  Recommend colonoscopy for further evaluation.  Patient reports significant weight loss 30 pounds over the last year.  Chronic vaginal spotting. Change of bowel habits the past few months.  More constipated.  Family history positive for brother who has colon cancer prostate cancer.  patient has underwent colonoscopy on 12/03/2018 which reviewed a nonobstructing large mass in the rectum.  Also chronic fistula.  Mass was not circumferential.  This was biopsied with a cold forceps for histology.  Pathology came back hyperplastic polyp negative for dysplasia and malignancy. [name], patient underwent flex sigmoidoscopy on 12/06/2018 with rebiopsy of the rectal mass. This time biopsy results came back positive for invasive colorectal adenocarcinoma, moderately differentiated. Immunotherapy for nearly mismatch repair protein (MMR ) was performed.  There is no loss of MMR expression.  low probability of MSI high.   # Seen by [name] for evaluation of resectability for rectal cancer. In addition, she also had a second opinion with [facility] where her endometrial biopsy pathology was changed to  adenocarcinoma, consistent with colorectal primary.   [name]. She has home health that has been assisting with ostomy care  Patient was also evaluated by [facility] oncology.  Recommendation is to proceed with TNT with concurrent chemoradiation followed by neoadjuvant chemotherapy followed by surgical resection. Patient prefers to have treatment done locally with [facility].   # Oncology Treatment:  02/03/2019- 03/19/2019  concurrent Xeloda and radiation.  Xeloda dose 825mg /m2 BID - rounded to 1650mg BID- on days of  radiation. 04/09/2019, started   on FOLFOX with bolus 5-FU omitted.  07/16/2019 finished 8 cycles of FOLFOX.  # Patient has underwent 09/17/19 APR/posterior vaginectomy/TAH/BSO/VY-flap pT4b pN0 with close vaginal margin 0.2 mm.  Uterus and ovaries negative for malignancy. Patient reports bilateral lower extremity numbness and tingling, intermittent, left worse than right. She has lost a lot of weight since her APR surgery.   Surveillance plan Recommend history and physical/tumor marker monitoring every 3 to 6 months for the first 2 years after surgery and then every 6 months for a total of 5 years. Chest abdomen pelvis every 6 to 12 months for total 5 years. Colonoscopy 1 year after surgery, if no advanced adenoma, repeat in 3 years and then 5 years.  #Family history with half brother having's history of colon cancer prostate cancer.  Personal history of colorectal cancer.  Patient has not decided if she wants genetic testing.   # vaginal introitus mass biopsied. Pathology is consistent with metastatic colorectal adenocarcinoma  # history of PE( 01/13/2020)  in the bilateral lower extremity DVT (01/13/2020).   She finishes 6 months of anticoagulation with Eliquis 5 mg twice daily. Now switched to Eliquis 2.5 mg twice daily..  # She has now developed recurrent disease. I have discussed with [name]  Dr. Hester Mates and the mass is not resectable. Patient has also had colonoscopy by Dr. Vicente Males yesterday. Normal examination. # 07/16/2020 cycle 1 FOLFIRI  # 07/20/2020 PET scan was done for further evaluation, images are consistent with local recurrence, no distant metastasis. #Discussed with radiation oncology Dr. Baruch Gouty will recommends concurrent chemotherapy and radiation. 08/02/2020-08/16/2020, patient starts radiation.  Xeloda was held due to neutropenia 08/17/2020,-09/06/2020 Xeloda 1500 mg twice daily concurrently with radiation INTERVAL HISTORY Jacqueline Yoder is a 75 y.o. female who has above  history reviewed by me presents for follow-up of rectal cancer. Patient has no new complaints. Patient reports feeling well.  She does not have any new complaints. She had a CT scan done during interval and presents to discuss results.  Review of Systems  Constitutional: Negative for appetite change, chills, fatigue, fever and unexpected weight change.  HENT:   Negative for hearing loss and voice change.   Eyes: Negative for eye problems.  Respiratory: Negative for chest tightness and cough.   Cardiovascular: Negative for chest pain.  Gastrointestinal: Negative for abdominal distention, abdominal pain, blood in stool, constipation, diarrhea and nausea.  Endocrine: Negative for hot flashes.  Genitourinary: Negative for difficulty urinating and frequency.   Musculoskeletal: Negative for arthralgias.  Skin: Negative for itching and rash.  Neurological: Negative for extremity weakness and numbness.  Hematological: Negative for adenopathy.  Psychiatric/Behavioral: Negative for confusion.    MEDICAL HISTORY:  Past Medical History:  Diagnosis Date  . Allergy   . Arthritis   . Blood clot in vein   . Family history of colon cancer   . GERD (gastroesophageal reflux disease)   . Hypercholesteremia   . Hypertension   . Hypertension   . Lower extremity edema   . Rectal cancer ([place]) 12/2018  . Urinary incontinence     SURGICAL HISTORY: Past Surgical History:  Procedure Laterality Date  . CHOLECYSTECTOMY  1971  . COLONOSCOPY WITH PROPOFOL N/A 12/03/2018   Procedure: COLONOSCOPY WITH PROPOFOL;  Surgeon: Lucilla Lame, MD;  Location: [facility] ENDOSCOPY;  Service: Endoscopy;  Laterality: N/A;  . COLONOSCOPY WITH PROPOFOL N/A 07/15/2020   Procedure: COLONOSCOPY WITH PROPOFOL;  Surgeon: [name], MD;  Location[facility] ENDOSCOPY;  Service: Gastroenterology;  Laterality: N/A;  .  FLEXIBLE SIGMOIDOSCOPY N/A 12/06/2018   Procedure: FLEXIBLE SIGMOIDOSCOPY;  Surgeon: [name], MD;  Location: [facility]  ENDOSCOPY;  Service: Endoscopy;  Laterality: N/A;  . LAPAROSCOPIC COLOSTOMY  01/06/2019  . PORTACATH PLACEMENT N/A 04/03/2019   Procedure: INSERTION PORT-A-CATH;  Surgeon: Jules Husbands, MD;  Location: [facility] ORS;  Service: General;  Laterality: N/A;    SOCIAL HISTORY: Social History   Socioeconomic History  . Marital status: Married    Spouse name: Not on file  . Number of children: Not on file  . Years of education: Not on file  . Highest education level: Not on file  Occupational History  . Not on file  Tobacco Use  . Smoking status: Former Smoker    Quit date: 12/02/1977    Years since quitting: 42.9  . Smokeless tobacco: Former Network engineer  . Vaping Use: Never used  Substance and Sexual Activity  . Alcohol use: Never  . Drug use: Never  . Sexual activity: Not Currently    Birth control/protection: None  Other Topics Concern  . Not on file  Social History Narrative   Lives with daughter   Social Determinants of Health   Financial Resource Strain: Not on file  Food Insecurity: Not on file  Transportation Needs: Not on file  Physical Activity: Not on file  Stress: Not on file  Social Connections: Not on file  Intimate Partner Violence: Not on file    FAMILY HISTORY: Family History  Problem Relation Age of Onset  . Colon cancer Brother 59       exposure to chemicals [place]  . Hypertension Mother   . Stroke Mother   . Kidney failure Father   . Breast cancer Neg Hx   . Ovarian cancer Neg Hx     ALLERGIES:  is allergic to sulfamethoxazole-trimethoprim.  MEDICATIONS:  Current Outpatient Medications  Medication Sig Dispense Refill  . apixaban (ELIQUIS) 2.5 MG TABS tablet Take 1 tablet (2.5 mg total) by mouth 2 (two) times daily. 60 tablet 1  . Cholecalciferol (VITAMIN D3) 2000 units capsule Take 2,000 Units by mouth daily.    . diclofenac sodium (VOLTAREN) 1 % GEL Apply 2 g topically 4 (four) times daily as needed (joint pain).   11  . docusate sodium  (COLACE) 100 MG capsule Take 100 mg by mouth 2 (two) times daily.    . fexofenadine (ALLEGRA) 180 MG tablet Take 180 mg by mouth daily.    . fluticasone (FLONASE) 50 MCG/ACT nasal spray Place 1 spray into both nostrils 2 (two) times a day.     . ipratropium (ATROVENT) 0.03 % nasal spray     . lidocaine-prilocaine (EMLA) cream Apply 1 application topically as needed. 30 g 6  . loperamide (IMODIUM) 2 MG capsule Take 1 capsule (2 mg total) by mouth See admin instructions. With onset of loose stool, take $RemoveBef'4mg'pVBDrqftDX$  followed by $RemoveBef'2mg'bcyblKMbLd$  every 2 hours,  Maximum: 16 mg/day 120 capsule 1  . Multiple Vitamins-Minerals (ONE-A-DAY WOMENS 50 PLUS PO) Take 1 tablet by mouth daily.     . potassium chloride SA (K-DUR) 20 MEQ tablet Take 20 mEq by mouth daily.     . simvastatin (ZOCOR) 40 MG tablet Take 40 mg by mouth at bedtime.     . triamterene-hydrochlorothiazide (DYAZIDE) 37.5-25 MG capsule Take 1 capsule by mouth daily.      No current facility-administered medications for this visit.     PHYSICAL EXAMINATION: ECOG PERFORMANCE STATUS: 1 - Symptomatic but completely ambulatory  Vitals:   10/22/20 1323  BP: 111/64  Pulse: 72  Resp: 16  Temp: (!) 97 F (36.1 C)  SpO2: 99%   Filed Weights   10/22/20 1323  Weight: 171 lb 1.6 oz (77.6 kg)    Physical Exam Constitutional:      General: She is not in acute distress. HENT:     Head: Normocephalic and atraumatic.  Eyes:     General: No scleral icterus. Cardiovascular:     Rate and Rhythm: Normal rate and regular rhythm.     Heart sounds: Normal heart sounds.  Pulmonary:     Effort: Pulmonary effort is normal. No respiratory distress.     Breath sounds: No wheezing.  Abdominal:     General: Bowel sounds are normal. There is no distension.     Palpations: Abdomen is soft.     Comments: + Colostomy bag   Musculoskeletal:        General: No deformity. Normal range of motion.     Cervical back: Normal range of motion and neck supple.  Skin:    General:  Skin is warm and dry.     Findings: No erythema or rash.  Neurological:     Mental Status: She is alert and oriented to person, place, and time. Mental status is at baseline.     Cranial Nerves: No cranial nerve deficit.     Coordination: Coordination normal.  Psychiatric:        Mood and Affect: Mood normal.    CMP Latest Ref Rng & Units 10/20/2020  Glucose 70 - 99 mg/dL 97  BUN 8 - 23 mg/dL 10  Creatinine 0.44 - 1.00 mg/dL 0.85  Sodium 135 - 145 mmol/L 136  Potassium 3.5 - 5.1 mmol/L 3.6  Chloride 98 - 111 mmol/L 98  CO2 22 - 32 mmol/L 27  Calcium 8.9 - 10.3 mg/dL 9.3  Total Protein 6.5 - 8.1 g/dL 6.9  Total Bilirubin 0.3 - 1.2 mg/dL 0.7  Alkaline Phos 38 - 126 U/L 45  AST 15 - 41 U/L 34  ALT 0 - 44 U/L 19   CBC Latest Ref Rng & Units 10/20/2020  WBC 4.0 - 10.5 K/uL 3.1(L)  Hemoglobin 12.0 - 15.0 g/dL 11.5(L)  Hematocrit 36.0 - 46.0 % 35.8(L)  Platelets 150 - 400 K/uL 175    LABORATORY DATA:  I have reviewed the data as listed Lab Results  Component Value Date   WBC 3.1 (L) 10/20/2020   HGB 11.5 (L) 10/20/2020   HCT 35.8 (L) 10/20/2020   MCV 95.5 10/20/2020   PLT 175 10/20/2020   Recent Labs    03/19/20 0826 06/23/20 1124 07/16/20 0802 07/23/20 1345 08/24/20 1014 09/02/20 1028 10/20/20 0858  NA 142 140 140   < > 139 138 136  K 4.1 3.7 3.2*   < > 4.1 3.8 3.6  CL 102 100 101   < > 99 99 98  CO2 _0 < > _1 GLUCOSE 95 114* 114*   < > 98 91 97  BUN _2 < > _3 CREATININE 1.01* 0.97 0.87   < > 0.97 0.72 0.85  CALCIUM 9.9 9.1 9.2   < > 9.5 9.5 9.3  GFRNONAA 56* 58* >60   < > >60 >60 >60  GFRAA >60 >60 >60  --   --   --   --   PROT 7.5 7.0 6.9   < >  7.4 7.1 6.9  ALBUMIN 4.4 4.0 3.6   < > 4.2 4.1 4.1  AST _0 < > 24 28 34  ALT _1 < > _2 ALKPHOS 37* 41 41   < > 38 35* 45  BILITOT 0.8 0.4 0.5   < > 0.4 0.5 0.7   < > = values in this interval not displayed.   Iron/TIBC/Ferritin/ %Sat    Component Value Date/Time    IRON 40 06/23/2020 1124   TIBC 246 (L) 06/23/2020 1124   FERRITIN 338 (H) 06/23/2020 1124   IRONPCTSAT 16 06/23/2020 1124     RADIOGRAPHIC STUDIES: I have personally reviewed the radiological images as listed and agreed with the findings in the report. CT CHEST ABDOMEN PELVIS W CONTRAST  Result Date: 10/20/2020 CLINICAL DATA:  Restaging rectal cancer. History of rectal cancer status post APR with vaginal recurrence. EXAM: CT CHEST, ABDOMEN, AND PELVIS WITH CONTRAST TECHNIQUE: Multidetector CT imaging of the chest, abdomen and pelvis was performed following the standard protocol during bolus administration of intravenous contrast. CONTRAST:  112m OMNIPAQUE IOHEXOL 300 MG/ML  SOLN COMPARISON:  PET-CT 07/20/2020 and CT scan 06/25/2020 FINDINGS: CT CHEST FINDINGS Cardiovascular: The heart is normal in size. No pericardial effusion. The aorta is normal in caliber. No dissection. Stable scattered atherosclerotic calcifications. Stable coronary artery calcifications. Mediastinum/Nodes: No mediastinal or hilar mass or lymphadenopathy. The esophagus is grossly normal. The thyroid gland is unremarkable. Lungs/Pleura: No acute pulmonary findings. Enlarging right middle lobe pulmonary nodule on image 98/3. It measures 6 mm and previously measured 4 mm. Stable small perifissural nodule in the right lower lobe on image 80/3 New 3 mm nodule in the left upper lobe on image number 43/3. Few other tiny scattered pulmonary nodules are noted. Musculoskeletal: No breast masses, supraclavicular or axillary adenopathy. The right Port-A-Cath is stable. The bony thorax is intact. No bone lesions or fractures. CT ABDOMEN PELVIS FINDINGS Hepatobiliary: No worrisome hepatic lesions to suggest hepatic metastatic disease. No intrahepatic biliary dilatation. Normal caliber and course of the common bile duct. Gallbladder is surgically absent. Pancreas: No mass, inflammation or ductal dilatation. Spleen: Normal size. No focal lesions.  Adrenals/Urinary Tract: The adrenal glands are normal. Bilateral renal cysts are noted. No worrisome renal lesions or hydronephrosis. The bladder is unremarkable. Stomach/Bowel: The stomach, duodenum and small bowel are unremarkable. No acute inflammatory process or obstructive findings. The terminal ileum is normal. The appendix is normal. Stable surgical changes from prior APR and stable left lower quadrant colostomy. No colonic mass is identified. No obstructive findings. Moderate diverticulosis. Vascular/Lymphatic: Stable atherosclerotic calcifications involving the aorta and branch vessels but no aneurysm or dissection. The major venous structures are patent. No mesenteric or retroperitoneal mass or adenopathy. Reproductive: Surgically absent. Other: Persistent moderate vaginal wall thickening. No discrete mass. No para vaginal or pelvic lymphadenopathy. No inguinal adenopathy. Musculoskeletal: No significant bony findings. IMPRESSION: 1. Stable surgical changes from prior APR and left lower quadrant colostomy. No findings for recurrent tumor, patent metastatic disease or abdominal/pelvic lymphadenopathy. 2. Enlarging right middle lobe pulmonary nodule and new 3 mm left upper lobe pulmonary nodule worrisome for pulmonary metastatic disease. 3. Persistent moderate vaginal wall thickening but no discrete mass. No pelvic or inguinal adenopathy. 4. Aortic atherosclerosis. Aortic Atherosclerosis (ICD10-I70.0). Electronically Signed   By: PMarijo SanesM.D.   On: 10/20/2020 11:57    CT CHEST ABDOMEN PELVIS W CONTRAST  Result Date: 10/20/2020 CLINICAL DATA:  Restaging rectal  cancer. History of rectal cancer status post APR with vaginal recurrence. EXAM: CT CHEST, ABDOMEN, AND PELVIS WITH CONTRAST TECHNIQUE: Multidetector CT imaging of the chest, abdomen and pelvis was performed following the standard protocol during bolus administration of intravenous contrast. CONTRAST:  175m OMNIPAQUE IOHEXOL 300 MG/ML  SOLN  COMPARISON:  PET-CT 07/20/2020 and CT scan 06/25/2020 FINDINGS: CT CHEST FINDINGS Cardiovascular: The heart is normal in size. No pericardial effusion. The aorta is normal in caliber. No dissection. Stable scattered atherosclerotic calcifications. Stable coronary artery calcifications. Mediastinum/Nodes: No mediastinal or hilar mass or lymphadenopathy. The esophagus is grossly normal. The thyroid gland is unremarkable. Lungs/Pleura: No acute pulmonary findings. Enlarging right middle lobe pulmonary nodule on image 98/3. It measures 6 mm and previously measured 4 mm. Stable small perifissural nodule in the right lower lobe on image 80/3 New 3 mm nodule in the left upper lobe on image number 43/3. Few other tiny scattered pulmonary nodules are noted. Musculoskeletal: No breast masses, supraclavicular or axillary adenopathy. The right Port-A-Cath is stable. The bony thorax is intact. No bone lesions or fractures. CT ABDOMEN PELVIS FINDINGS Hepatobiliary: No worrisome hepatic lesions to suggest hepatic metastatic disease. No intrahepatic biliary dilatation. Normal caliber and course of the common bile duct. Gallbladder is surgically absent. Pancreas: No mass, inflammation or ductal dilatation. Spleen: Normal size. No focal lesions. Adrenals/Urinary Tract: The adrenal glands are normal. Bilateral renal cysts are noted. No worrisome renal lesions or hydronephrosis. The bladder is unremarkable. Stomach/Bowel: The stomach, duodenum and small bowel are unremarkable. No acute inflammatory process or obstructive findings. The terminal ileum is normal. The appendix is normal. Stable surgical changes from prior APR and stable left lower quadrant colostomy. No colonic mass is identified. No obstructive findings. Moderate diverticulosis. Vascular/Lymphatic: Stable atherosclerotic calcifications involving the aorta and branch vessels but no aneurysm or dissection. The major venous structures are patent. No mesenteric or  retroperitoneal mass or adenopathy. Reproductive: Surgically absent. Other: Persistent moderate vaginal wall thickening. No discrete mass. No para vaginal or pelvic lymphadenopathy. No inguinal adenopathy. Musculoskeletal: No significant bony findings. IMPRESSION: 1. Stable surgical changes from prior APR and left lower quadrant colostomy. No findings for recurrent tumor, patent metastatic disease or abdominal/pelvic lymphadenopathy. 2. Enlarging right middle lobe pulmonary nodule and new 3 mm left upper lobe pulmonary nodule worrisome for pulmonary metastatic disease. 3. Persistent moderate vaginal wall thickening but no discrete mass. No pelvic or inguinal adenopathy. 4. Aortic atherosclerosis. Aortic Atherosclerosis (ICD10-I70.0). Electronically Signed   By: PMarijo SanesM.D.   On: 10/20/2020 11:57    ASSESSMENT & PLAN:  1. Rectal cancer (HCC)   2. Lung nodules   3. History of pulmonary embolism   Cancer Staging Rectal cancer ([facility] Staging form: Colon and Rectum, AJCC 8th Edition - Clinical stage from 01/23/2019: Stage IIIC (cT4b, cN1a, cM0) - Signed by YEarlie Server MD on 01/23/2019 - Pathologic stage from 10/06/2019: Stage IIC (ypT4b, pN0, cM0) - Signed by YEarlie Server MD on 10/06/2019  #History of stage IIIC Rectal cancer, s/p TNT, followed by 09/17/19 APR/posterior vaginectomy/TAH/BSO/VY-flap pT4b pN0 with close vaginal margin 0.2 mm.  Uterus and ovaries negative for malignancy. Recurrent disease Finished palliative radiation to vaginal recurrence.  Finished 09/04/2020. Clinically she is doing well. Labs are reviewed and discussed with patient. CEA trended up to 931. CT chest abdomen pelvis images were reviewed by me and discussed with patient.  No findings of recurrent tumor Enlarging right middle lobe pulmonary nodule and new 3 mm left upper lobe pulmonary nodule.  This is worrisome for pulmonary metastasis. Persistent moderate vaginal wall thickening but no discrete mass. For the new/enlarged  lung nodules, size are small and I recommend attention on follow-up.  She will need to have a repeat CT scan done in 3 months. Rising CEA, questionable radiographic occult disease.  I will check a bone scan. She is at risk of recurrence.  Tumor marker often rises prior to radiographic progression. I recommend patient to repeat CEA in 1 month. History of pulmonary embolism, history of bilateral DVT continue Eliquis 2.5 mg twice daily.   Follow-up in 3 months, after repeat CT scan.  Repeat CEA in 1 month.  Earlie Server, MD, PhD

## 2020-10-26 DIAGNOSIS — Z933 Colostomy status: Secondary | ICD-10-CM | POA: Diagnosis not present

## 2020-11-17 ENCOUNTER — Telehealth: Payer: Self-pay | Admitting: Oncology

## 2020-11-17 NOTE — Telephone Encounter (Signed)
Pt called to clarify if CT scan scheduled on 01/19/21 is still needed-she had one on

## 2020-11-19 ENCOUNTER — Other Ambulatory Visit: Payer: Self-pay

## 2020-11-19 ENCOUNTER — Inpatient Hospital Stay: Payer: Medicare Other | Attending: Oncology

## 2020-11-19 DIAGNOSIS — C20 Malignant neoplasm of rectum: Secondary | ICD-10-CM | POA: Diagnosis not present

## 2020-11-19 DIAGNOSIS — Z452 Encounter for adjustment and management of vascular access device: Secondary | ICD-10-CM | POA: Diagnosis not present

## 2020-11-19 DIAGNOSIS — Z95828 Presence of other vascular implants and grafts: Secondary | ICD-10-CM

## 2020-11-19 MED ORDER — HEPARIN SOD (PORK) LOCK FLUSH 100 UNIT/ML IV SOLN
500.0000 [IU] | Freq: Once | INTRAVENOUS | Status: DC
  Filled 2020-11-19: qty 5

## 2020-11-19 MED ORDER — SODIUM CHLORIDE 0.9% FLUSH
10.0000 mL | Freq: Once | INTRAVENOUS | Status: DC
  Filled 2020-11-19: qty 10

## 2020-11-20 LAB — CEA: CEA: 761 ng/mL — ABNORMAL HIGH (ref 0.0–4.7)

## 2020-11-25 DIAGNOSIS — Z933 Colostomy status: Secondary | ICD-10-CM | POA: Diagnosis not present

## 2020-11-29 ENCOUNTER — Inpatient Hospital Stay: Payer: Medicare Other

## 2020-11-29 DIAGNOSIS — Z95828 Presence of other vascular implants and grafts: Secondary | ICD-10-CM

## 2020-11-29 DIAGNOSIS — Z452 Encounter for adjustment and management of vascular access device: Secondary | ICD-10-CM | POA: Diagnosis not present

## 2020-11-29 DIAGNOSIS — C20 Malignant neoplasm of rectum: Secondary | ICD-10-CM | POA: Diagnosis not present

## 2020-11-29 MED ORDER — SODIUM CHLORIDE 0.9% FLUSH
10.0000 mL | Freq: Once | INTRAVENOUS | Status: AC
  Administered 2020-11-29: 10 mL via INTRAVENOUS
  Filled 2020-11-29: qty 10

## 2020-11-29 MED ORDER — HEPARIN SOD (PORK) LOCK FLUSH 100 UNIT/ML IV SOLN
500.0000 [IU] | Freq: Once | INTRAVENOUS | Status: AC
  Administered 2020-11-29: 500 [IU] via INTRAVENOUS
  Filled 2020-11-29: qty 5

## 2020-11-29 MED ORDER — HEPARIN SOD (PORK) LOCK FLUSH 100 UNIT/ML IV SOLN
INTRAVENOUS | Status: AC
  Filled 2020-11-29: qty 5

## 2020-12-07 DIAGNOSIS — H35353 Cystoid macular degeneration, bilateral: Secondary | ICD-10-CM | POA: Diagnosis not present

## 2020-12-09 ENCOUNTER — Ambulatory Visit: Payer: Medicare Other | Admitting: Radiation Oncology

## 2020-12-15 ENCOUNTER — Other Ambulatory Visit: Payer: Self-pay

## 2020-12-15 ENCOUNTER — Ambulatory Visit (INDEPENDENT_AMBULATORY_CARE_PROVIDER_SITE_OTHER): Payer: Medicare Other | Admitting: Family Medicine

## 2020-12-15 ENCOUNTER — Encounter: Payer: Self-pay | Admitting: Family Medicine

## 2020-12-15 ENCOUNTER — Other Ambulatory Visit: Payer: Self-pay | Admitting: Family Medicine

## 2020-12-15 VITALS — BP 127/63 | HR 65 | Ht 65.0 in | Wt 169.4 lb

## 2020-12-15 DIAGNOSIS — Z7901 Long term (current) use of anticoagulants: Secondary | ICD-10-CM

## 2020-12-15 DIAGNOSIS — L89311 Pressure ulcer of right buttock, stage 1: Secondary | ICD-10-CM | POA: Diagnosis not present

## 2020-12-15 DIAGNOSIS — I129 Hypertensive chronic kidney disease with stage 1 through stage 4 chronic kidney disease, or unspecified chronic kidney disease: Secondary | ICD-10-CM | POA: Diagnosis not present

## 2020-12-15 DIAGNOSIS — R7309 Other abnormal glucose: Secondary | ICD-10-CM

## 2020-12-15 DIAGNOSIS — Z0000 Encounter for general adult medical examination without abnormal findings: Secondary | ICD-10-CM

## 2020-12-15 DIAGNOSIS — I825Z3 Chronic embolism and thrombosis of unspecified deep veins of distal lower extremity, bilateral: Secondary | ICD-10-CM | POA: Diagnosis not present

## 2020-12-15 DIAGNOSIS — N182 Chronic kidney disease, stage 2 (mild): Secondary | ICD-10-CM

## 2020-12-15 DIAGNOSIS — E782 Mixed hyperlipidemia: Secondary | ICD-10-CM

## 2020-12-15 DIAGNOSIS — T451X5A Adverse effect of antineoplastic and immunosuppressive drugs, initial encounter: Secondary | ICD-10-CM

## 2020-12-15 DIAGNOSIS — D701 Agranulocytosis secondary to cancer chemotherapy: Secondary | ICD-10-CM

## 2020-12-15 MED ORDER — APIXABAN 2.5 MG PO TABS: 2.5000 mg | ORAL_TABLET | Freq: Two times a day (BID) | ORAL | 5 refills | Status: DC

## 2020-12-15 NOTE — Assessment & Plan Note (Signed)
Stable, gradual improvement No sign of complication Keep using barrier cream, offloading while seated, using pillows, changing position F/u

## 2020-12-15 NOTE — Patient Instructions (Addendum)
Thank you for coming to the office today.  For Mammogram screening for breast cancer   Call the [facility] below anytime to schedule your own appointment now that order has been placed.  [facility] [place], [place] [address] Phone: [phone]  Keep on current medications.  ---------  Refilled Eliquis 2.5mg  daily, for up to 6 months.  ------  Stage 1 pressure sore on Right gluteal area, appears to be stable or improved. No complication or infection. Keep on topical barrier cream and offload with cushion / support and change positions often to avoid direct pressure. It should gradually heal.   DUE for FASTING BLOOD WORK (no food or drink after midnight before the lab appointment, only water or coffee without cream/sugar on the morning of)  SCHEDULE "Lab Only" visit in the morning at the clinic for lab draw in 6 MONTHS   - Make sure Lab Only appointment is at about 1 week before your next appointment, so that results will be available  For Lab Results, once available within 2-3 days of blood draw, you can can log in to MyChart online to view your results and a brief explanation. Also, we can discuss results at next follow-up visit.    Please schedule a Follow-up Appointment to: Return in about 6 months (around 06/17/2021) for 6 month fasting lab only then 1 week later Annual Physical.  If you have any other questions or concerns, please feel free to call the office or send a message through [place]. You may also schedule an earlier appointment if necessary.  Additionally, you may be receiving a survey about your experience at our office within a few days to 1 week by e-mail or mail. We value your feedback.  Nobie Putnam, DO [place]

## 2020-12-15 NOTE — Assessment & Plan Note (Signed)
Well-controlled HTN - Home BP readings reviewed  No known complications    Plan:  1. Continue current BP regimen Triamterene-HCTZ 37.5-25mg  daily, and on K supplement 29mEq daily 2. Encourage improved lifestyle - low sodium diet, regular exercise 3. Continue monitor BP outside office, bring readings to next visit, if persistently >140/90 or new symptoms notify office sooner

## 2020-12-15 NOTE — Progress Notes (Signed)
Subjective:    Patient ID: Jacqueline Yoder, female    DOB: [birth year], 75 y.o.   MRN: [id number]  Jacqueline Yoder is a 75 y.o. female presenting on 12/15/2020 for Hypertension  Accompanied by daughter Judie Petit.  HPI   Oncologist - Dr Earlie Server [facility]) Radiation Oncologist - Dr Noreene Filbert [facility]) GYN - Dani Gobble CNM ([facility])  Metastatic Adenocarcinoma Carcinoma of Colon Spread to Endometrium/Uterine Cancer Initial diagnosis followed since 2019 with postmenopausal vaginal bleeding and spotting, and ultimately led to tissue diagnosis from endometrial biopsy.  S/p12/2/20 APR/posterior vaginectomy / Hysterectomy (total abdominal) /BSO/VY-flap  She did chemo and radiation. And is now on surveillance plan with blood work tumor marker every 3-6 months for first 2 years, and then every 6 months for up to 5 years.  She has Colostomy bag.  Last year identified some blood clots on CT scanning, with clot in lung with identified PE (12/2019) and bilateral lower extremity (12/2019), she was treated with Eliquis for anticoagulation. Transitioned to Eliquis 2.5mg  BID.  Last visit 09/02/20 - there was identified recurrent disease.  Was advised about future follow-up with future chemo/radiation plans and surveillance.  She has had some weight loss during course of past >1 year with treatment for cancer.  She has worked with nutritionist, and now has better appetite and taking in better nutrition.  Hyperlipidemia Currently taking Simvastatin 40mg  daily at bedtime.  CHRONIC HTN: Hypokalemia Reports home readings 110-120 / 60-70s Current Meds - Triamterene-HCTZ 37.5-25mg  daily. SHe is on Potassium Supplement 52mEq daily. Last lab K 3.6 in 10/2020 Reports good compliance, took meds today. Tolerating well, w/o complaints. Denies CP, dyspnea, HA, edema, dizziness / lightheadedness  Additional topic  Chronic DVT / on Chronic Anticoagulation On Eliquis 2.5mg  daily  for anticoagulation, without any issues, previously ordered by Dr Tasia Catchings she was on financial assistance during medicare donut hole, now needs new rx sent to local pharmacy.  Gluteal Pressure Sore Stage 1 / Superficial Picture 11/11/20 on mychart reviewed Recommended topical barrier cream It has done well. No new concerns on this spot. Still using topical barrier cream.   Health Maintenance: Due mammogram, ordered. She will call to schedule.   Depression screen PHQ 2/9 09/13/2020  Decreased Interest 0  Down, Depressed, Hopeless 0  PHQ - 2 Score 0  Some recent data might be hidden    Social History   Tobacco Use  . Smoking status: Former Smoker    Quit date: 12/02/1977    Years since quitting: 43.0  . Smokeless tobacco: Former Network engineer  . Vaping Use: Never used  Substance Use Topics  . Alcohol use: Never  . Drug use: Never    Review of Systems Per HPI unless specifically indicated above     Objective:    BP 127/63   Pulse 65   Ht 5\' 5"  (1.651 m)   Wt 169 lb 6.4 oz (76.8 kg)   SpO2 100%   BMI 28.19 kg/m   Wt Readings from Last 3 Encounters:  12/15/20 169 lb 6.4 oz (76.8 kg)  10/22/20 171 lb 1.6 oz (77.6 kg)  10/21/20 170 lb 1.6 oz (77.2 kg)    Physical Exam Vitals and nursing note reviewed.  Constitutional:      General: She is not in acute distress.    Appearance: She is well-developed and well-nourished. She is obese. She is not diaphoretic.     Comments: Well-appearing, comfortable, cooperative  HENT:  Head: Normocephalic and atraumatic.     Mouth/Throat:     Mouth: Oropharynx is clear and moist.  Eyes:     General:        Right eye: No discharge.        Left eye: No discharge.     Conjunctiva/sclera: Conjunctivae normal.  Neck:     Thyroid: No thyromegaly.  Cardiovascular:     Rate and Rhythm: Normal rate and regular rhythm.     Pulses: Intact distal pulses.     Heart sounds: Normal heart sounds. No murmur heard.   Pulmonary:      Effort: Pulmonary effort is normal. No respiratory distress.     Breath sounds: Normal breath sounds. No wheezing or rales.  Musculoskeletal:        General: No edema. Normal range of motion.     Cervical back: Normal range of motion and neck supple.  Lymphadenopathy:     Cervical: No cervical adenopathy.  Skin:    General: Skin is warm and dry.     Findings: Lesion (right gluteal cleft area with 1 x1 cm superficial sore only with loss of superficial top layer, has barrier cream in place, no deeper ulceration, no abscess or cellulitis.) present. No erythema or rash.  Neurological:     Mental Status: She is alert and oriented to person, place, and time.  Psychiatric:        Mood and Affect: Mood and affect normal.        Behavior: Behavior normal.     Comments: Well groomed, good eye contact, normal speech and thoughts      Results for orders placed or performed in visit on 11/19/20  CEA  Result Value Ref Range   CEA 761.0 (H) 0.0 - 4.7 ng/mL      Assessment & Plan:   Problem List Items Addressed This Visit    Pressure injury of right buttock, stage 1    Stable, gradual improvement No sign of complication Keep using barrier cream, offloading while seated, using pillows, changing position F/u      Current use of long term anticoagulation   Relevant Medications   apixaban (ELIQUIS) 2.5 MG TABS tablet   Chronic deep vein thrombosis (DVT) (HCC)   Relevant Medications   apixaban (ELIQUIS) 2.5 MG TABS tablet   Benign hypertension with CKD (chronic kidney disease), stage II - Primary    Well-controlled HTN - Home BP readings reviewed  No known complications    Plan:  1. Continue current BP regimen Triamterene-HCTZ 37.5-25mg  daily, and on K supplement 2mEq daily 2. Encourage improved lifestyle - low sodium diet, regular exercise 3. Continue monitor BP outside office, bring readings to next visit, if persistently >140/90 or new symptoms notify office sooner      Relevant  Medications   apixaban (ELIQUIS) 2.5 MG TABS tablet     #Chronic DVT / Chronic Anticoag Refill Eliquis 2.5mg  BID   Meds ordered this encounter  Medications  . apixaban (ELIQUIS) 2.5 MG TABS tablet    Sig: Take 1 tablet (2.5 mg total) by mouth 2 (two) times daily.    Dispense:  60 tablet    Refill:  5     Follow up plan: Return in about 6 months (around 06/17/2021) for 6 month fasting lab only then 1 week later Annual Physical.  Future labs ordered 06/17/21  Nobie Putnam, [facility] 12/15/2020, 9:50 AM

## 2020-12-20 ENCOUNTER — Telehealth: Payer: Self-pay | Admitting: Pharmacy Technician

## 2020-12-20 ENCOUNTER — Other Ambulatory Visit: Payer: Self-pay | Admitting: Family Medicine

## 2020-12-20 DIAGNOSIS — Z1231 Encounter for screening mammogram for malignant neoplasm of breast: Secondary | ICD-10-CM

## 2020-12-20 NOTE — Telephone Encounter (Signed)
Patient inquiring about assistance with Eliquis.  Patient has a Medicare Part D plan and is not in the coverage gap.  Explained that [facility] would be unable to provide assistance with Eliquis.  However, if patient goes into the coverage gap, [facility] would attempt to obtain the medication from PAP.  Assisted patient with contacting [employer], her Medicare Part D provider.  Spoke with Alvie Heidelberg.  Alvie Heidelberg stated that patient has a $95 deductible and co-pay for a 30 day supply of Eliquis is $47.  Alvie Heidelberg assisted patient with screening for L.I.S.  [facility]

## 2020-12-21 ENCOUNTER — Ambulatory Visit: Payer: Medicare Other | Admitting: Podiatry

## 2020-12-21 ENCOUNTER — Encounter: Payer: Self-pay | Admitting: Podiatry

## 2020-12-21 ENCOUNTER — Other Ambulatory Visit: Payer: Self-pay

## 2020-12-21 DIAGNOSIS — B351 Tinea unguium: Secondary | ICD-10-CM

## 2020-12-21 DIAGNOSIS — M79674 Pain in right toe(s): Secondary | ICD-10-CM

## 2020-12-21 DIAGNOSIS — M79675 Pain in left toe(s): Secondary | ICD-10-CM | POA: Diagnosis not present

## 2020-12-21 DIAGNOSIS — D689 Coagulation defect, unspecified: Secondary | ICD-10-CM

## 2020-12-22 ENCOUNTER — Encounter: Payer: Self-pay | Admitting: Podiatry

## 2020-12-22 NOTE — Progress Notes (Signed)
  Subjective:  Patient ID: Jacqueline Yoder, female    DOB: 1946-03-29,  MRN: [id number]  Chief Complaint  Patient presents with  . Nail Problem    Nail trim and RFC   No complaints today   75 y.o. female returns for the above complaint.  Patient presents with thickened elongated dystrophic toenails x10.  Patient states painful to touch.  She is not able to debride herself.  She would like for me to do it.  She denies any other acute complaints.  She states that she is on Eliquis from previous history of PE.  Objective:  There were no vitals filed for this visit. Podiatric Exam: Vascular: dorsalis pedis and posterior tibial pulses are palpable bilateral. Capillary return is immediate. Temperature gradient is WNL. Skin turgor WNL  Sensorium: Normal Semmes Weinstein monofilament test. Normal tactile sensation bilaterally. Nail Exam: Pt has thick disfigured discolored nails with subungual debris noted bilateral entire nail hallux through fifth toenails.  Pain on palpation to the nails. Ulcer Exam: There is no evidence of ulcer or pre-ulcerative changes or infection. Orthopedic Exam: Muscle tone and strength are WNL. No limitations in general ROM. No crepitus or effusions noted. HAV  B/L.  Hammer toes 2-5  B/L. Skin: No Porokeratosis. No infection or ulcers    Assessment & Plan:   1. Coagulation defect ([place])   2. Pain due to onychomycosis of toenails of both feet     Patient was evaluated and treated and all questions answered.  Onychomycosis with pain  -Nails palliatively debrided as below. -Educated on self-care  Procedure: Nail Debridement Rationale: pain  Type of Debridement: manual, sharp debridement. Instrumentation: Nail nipper, rotary burr. Number of Nails: 10  Procedures and Treatment: Consent by patient was obtained for treatment procedures. The patient understood the discussion of treatment and procedures well. All questions were answered thoroughly reviewed. Debridement  of mycotic and hypertrophic toenails, 1 through 5 bilateral and clearing of subungual debris. No ulceration, no infection noted.  Return Visit-Office Procedure: Patient instructed to return to the office for a follow up visit 3 months for continued evaluation and treatment.  Boneta Lucks, DPM    No follow-ups on file.

## 2020-12-23 DIAGNOSIS — H21543 Posterior synechiae (iris), bilateral: Secondary | ICD-10-CM | POA: Diagnosis not present

## 2020-12-23 DIAGNOSIS — Z933 Colostomy status: Secondary | ICD-10-CM | POA: Diagnosis not present

## 2020-12-23 DIAGNOSIS — H35033 Hypertensive retinopathy, bilateral: Secondary | ICD-10-CM | POA: Diagnosis not present

## 2020-12-23 DIAGNOSIS — H35373 Puckering of macula, bilateral: Secondary | ICD-10-CM | POA: Diagnosis not present

## 2020-12-23 DIAGNOSIS — H35351 Cystoid macular degeneration, right eye: Secondary | ICD-10-CM | POA: Diagnosis not present

## 2020-12-28 DIAGNOSIS — Z933 Colostomy status: Secondary | ICD-10-CM | POA: Diagnosis not present

## 2021-01-06 DIAGNOSIS — H40023 Open angle with borderline findings, high risk, bilateral: Secondary | ICD-10-CM | POA: Diagnosis not present

## 2021-01-06 DIAGNOSIS — H04123 Dry eye syndrome of bilateral lacrimal glands: Secondary | ICD-10-CM | POA: Diagnosis not present

## 2021-01-11 ENCOUNTER — Other Ambulatory Visit: Payer: Self-pay

## 2021-01-11 ENCOUNTER — Ambulatory Visit
Admission: RE | Admit: 2021-01-11 | Discharge: 2021-01-11 | Disposition: A | Discharge status: Home / Self Care | Payer: Medicare Other | Source: Ambulatory Visit | Attending: Family Medicine | Admitting: Family Medicine

## 2021-01-11 DIAGNOSIS — Z1231 Encounter for screening mammogram for malignant neoplasm of breast: Secondary | ICD-10-CM | POA: Diagnosis not present

## 2021-01-18 ENCOUNTER — Other Ambulatory Visit: Payer: Medicare Other

## 2021-01-18 ENCOUNTER — Inpatient Hospital Stay: Payer: Medicare Other | Attending: Oncology

## 2021-01-18 DIAGNOSIS — Z87891 Personal history of nicotine dependence: Secondary | ICD-10-CM | POA: Diagnosis not present

## 2021-01-18 DIAGNOSIS — I700 Atherosclerosis of aorta: Secondary | ICD-10-CM | POA: Insufficient documentation

## 2021-01-18 DIAGNOSIS — Z85048 Personal history of other malignant neoplasm of rectum, rectosigmoid junction, and anus: Secondary | ICD-10-CM | POA: Diagnosis not present

## 2021-01-18 DIAGNOSIS — Z86718 Personal history of other venous thrombosis and embolism: Secondary | ICD-10-CM | POA: Diagnosis not present

## 2021-01-18 DIAGNOSIS — Z5111 Encounter for antineoplastic chemotherapy: Secondary | ICD-10-CM | POA: Insufficient documentation

## 2021-01-18 DIAGNOSIS — Z800 Family history of malignant neoplasm of digestive organs: Secondary | ICD-10-CM | POA: Diagnosis not present

## 2021-01-18 DIAGNOSIS — Z79899 Other long term (current) drug therapy: Secondary | ICD-10-CM | POA: Insufficient documentation

## 2021-01-18 DIAGNOSIS — Z515 Encounter for palliative care: Secondary | ICD-10-CM | POA: Diagnosis not present

## 2021-01-18 DIAGNOSIS — E7800 Pure hypercholesterolemia, unspecified: Secondary | ICD-10-CM | POA: Insufficient documentation

## 2021-01-18 DIAGNOSIS — Z86711 Personal history of pulmonary embolism: Secondary | ICD-10-CM | POA: Insufficient documentation

## 2021-01-18 DIAGNOSIS — N281 Cyst of kidney, acquired: Secondary | ICD-10-CM | POA: Insufficient documentation

## 2021-01-18 DIAGNOSIS — K521 Toxic gastroenteritis and colitis: Secondary | ICD-10-CM | POA: Diagnosis not present

## 2021-01-18 DIAGNOSIS — K219 Gastro-esophageal reflux disease without esophagitis: Secondary | ICD-10-CM | POA: Diagnosis not present

## 2021-01-18 DIAGNOSIS — R350 Frequency of micturition: Secondary | ICD-10-CM | POA: Diagnosis not present

## 2021-01-18 DIAGNOSIS — R609 Edema, unspecified: Secondary | ICD-10-CM | POA: Insufficient documentation

## 2021-01-18 DIAGNOSIS — C541 Malignant neoplasm of endometrium: Secondary | ICD-10-CM | POA: Insufficient documentation

## 2021-01-18 DIAGNOSIS — I10 Essential (primary) hypertension: Secondary | ICD-10-CM | POA: Diagnosis not present

## 2021-01-18 DIAGNOSIS — C20 Malignant neoplasm of rectum: Secondary | ICD-10-CM | POA: Insufficient documentation

## 2021-01-18 LAB — CBC WITH DIFFERENTIAL/PLATELET
Abs Immature Granulocytes: 0.01 10*3/uL (ref 0.00–0.07)
Basophils Absolute: 0 10*3/uL (ref 0.0–0.1)
Basophils Relative: 1 %
Eosinophils Absolute: 0.1 10*3/uL (ref 0.0–0.5)
Eosinophils Relative: 3 %
HCT: 38.7 % (ref 36.0–46.0)
Hemoglobin: 12.5 g/dL (ref 12.0–15.0)
Immature Granulocytes: 0 %
Lymphocytes Relative: 41 %
Lymphs Abs: 1.3 10*3/uL (ref 0.7–4.0)
MCH: 29.9 pg (ref 26.0–34.0)
MCHC: 32.3 g/dL (ref 30.0–36.0)
MCV: 92.6 fL (ref 80.0–100.0)
Monocytes Absolute: 0.3 10*3/uL (ref 0.1–1.0)
Monocytes Relative: 8 %
Neutro Abs: 1.5 10*3/uL — ABNORMAL LOW (ref 1.7–7.7)
Neutrophils Relative %: 47 %
Platelets: 204 10*3/uL (ref 150–400)
RBC: 4.18 MIL/uL (ref 3.87–5.11)
RDW: 13.3 % (ref 11.5–15.5)
WBC: 3.1 10*3/uL — ABNORMAL LOW (ref 4.0–10.5)
nRBC: 0 % (ref 0.0–0.2)

## 2021-01-18 LAB — COMPREHENSIVE METABOLIC PANEL
ALT: 19 U/L (ref 0–44)
AST: 33 U/L (ref 15–41)
Albumin: 4.2 g/dL (ref 3.5–5.0)
Alkaline Phosphatase: 49 U/L (ref 38–126)
Anion gap: 10 (ref 5–15)
BUN: 10 mg/dL (ref 8–23)
CO2: 30 mmol/L (ref 22–32)
Calcium: 9.7 mg/dL (ref 8.9–10.3)
Chloride: 101 mmol/L (ref 98–111)
Creatinine, Ser: 0.82 mg/dL (ref 0.44–1.00)
GFR, Estimated: >60 mL/min (ref >60)
Glucose, Bld: 93 mg/dL (ref 70–99)
Potassium: 4.2 mmol/L (ref 3.5–5.1)
Sodium: 141 mmol/L (ref 135–145)
Total Bilirubin: 0.5 mg/dL (ref 0.3–1.2)
Total Protein: 7.3 g/dL (ref 6.5–8.1)

## 2021-01-19 ENCOUNTER — Other Ambulatory Visit: Payer: Medicare Other

## 2021-01-19 ENCOUNTER — Ambulatory Visit
Admission: RE | Admit: 2021-01-19 | Discharge: 2021-01-19 | Disposition: A | Discharge status: Home / Self Care | Payer: Medicare Other | Source: Ambulatory Visit | Attending: Oncology | Admitting: Oncology

## 2021-01-19 ENCOUNTER — Other Ambulatory Visit: Payer: Self-pay

## 2021-01-19 DIAGNOSIS — C20 Malignant neoplasm of rectum: Secondary | ICD-10-CM | POA: Diagnosis not present

## 2021-01-19 DIAGNOSIS — I2510 Atherosclerotic heart disease of native coronary artery without angina pectoris: Secondary | ICD-10-CM | POA: Diagnosis not present

## 2021-01-19 DIAGNOSIS — K5750 Diverticulosis of both small and large intestine without perforation or abscess without bleeding: Secondary | ICD-10-CM | POA: Diagnosis not present

## 2021-01-19 DIAGNOSIS — N281 Cyst of kidney, acquired: Secondary | ICD-10-CM | POA: Diagnosis not present

## 2021-01-19 DIAGNOSIS — I700 Atherosclerosis of aorta: Secondary | ICD-10-CM | POA: Diagnosis not present

## 2021-01-19 DIAGNOSIS — R590 Localized enlarged lymph nodes: Secondary | ICD-10-CM | POA: Diagnosis not present

## 2021-01-19 LAB — CEA: CEA: 2037 ng/mL — ABNORMAL HIGH (ref 0.0–4.7)

## 2021-01-19 MED ORDER — IOHEXOL 300 MG/ML  SOLN
100.0000 mL | Freq: Once | INTRAMUSCULAR | Status: AC | PRN
  Administered 2021-01-19: 100 mL via INTRAVENOUS

## 2021-01-21 ENCOUNTER — Inpatient Hospital Stay: Payer: Medicare Other | Admitting: Oncology

## 2021-01-21 ENCOUNTER — Other Ambulatory Visit: Payer: Self-pay

## 2021-01-21 ENCOUNTER — Encounter: Payer: Self-pay | Admitting: Oncology

## 2021-01-21 VITALS — BP 117/77 | HR 73 | Temp 97.8°F | Resp 16 | Wt 170.0 lb

## 2021-01-21 DIAGNOSIS — I10 Essential (primary) hypertension: Secondary | ICD-10-CM | POA: Diagnosis not present

## 2021-01-21 DIAGNOSIS — C799 Secondary malignant neoplasm of unspecified site: Secondary | ICD-10-CM | POA: Diagnosis not present

## 2021-01-21 DIAGNOSIS — Z800 Family history of malignant neoplasm of digestive organs: Secondary | ICD-10-CM | POA: Diagnosis not present

## 2021-01-21 DIAGNOSIS — Z933 Colostomy status: Secondary | ICD-10-CM | POA: Diagnosis not present

## 2021-01-21 DIAGNOSIS — Z86718 Personal history of other venous thrombosis and embolism: Secondary | ICD-10-CM | POA: Diagnosis not present

## 2021-01-21 DIAGNOSIS — R350 Frequency of micturition: Secondary | ICD-10-CM | POA: Diagnosis not present

## 2021-01-21 DIAGNOSIS — N281 Cyst of kidney, acquired: Secondary | ICD-10-CM | POA: Diagnosis not present

## 2021-01-21 DIAGNOSIS — Z5111 Encounter for antineoplastic chemotherapy: Secondary | ICD-10-CM | POA: Diagnosis not present

## 2021-01-21 DIAGNOSIS — Z85048 Personal history of other malignant neoplasm of rectum, rectosigmoid junction, and anus: Secondary | ICD-10-CM | POA: Diagnosis not present

## 2021-01-21 DIAGNOSIS — R609 Edema, unspecified: Secondary | ICD-10-CM | POA: Diagnosis not present

## 2021-01-21 DIAGNOSIS — Z79899 Other long term (current) drug therapy: Secondary | ICD-10-CM | POA: Diagnosis not present

## 2021-01-21 DIAGNOSIS — E7800 Pure hypercholesterolemia, unspecified: Secondary | ICD-10-CM | POA: Diagnosis not present

## 2021-01-21 DIAGNOSIS — C20 Malignant neoplasm of rectum: Secondary | ICD-10-CM | POA: Diagnosis not present

## 2021-01-21 DIAGNOSIS — K521 Toxic gastroenteritis and colitis: Secondary | ICD-10-CM | POA: Diagnosis not present

## 2021-01-21 DIAGNOSIS — Z7189 Other specified counseling: Secondary | ICD-10-CM | POA: Diagnosis not present

## 2021-01-21 DIAGNOSIS — Z86711 Personal history of pulmonary embolism: Secondary | ICD-10-CM | POA: Diagnosis not present

## 2021-01-21 DIAGNOSIS — Z95828 Presence of other vascular implants and grafts: Secondary | ICD-10-CM

## 2021-01-21 DIAGNOSIS — K219 Gastro-esophageal reflux disease without esophagitis: Secondary | ICD-10-CM | POA: Diagnosis not present

## 2021-01-21 DIAGNOSIS — I700 Atherosclerosis of aorta: Secondary | ICD-10-CM | POA: Diagnosis not present

## 2021-01-21 DIAGNOSIS — Z87891 Personal history of nicotine dependence: Secondary | ICD-10-CM | POA: Diagnosis not present

## 2021-01-21 DIAGNOSIS — Z515 Encounter for palliative care: Secondary | ICD-10-CM | POA: Diagnosis not present

## 2021-01-21 MED ORDER — ONDANSETRON HCL 8 MG PO TABS
8.0000 mg | ORAL_TABLET | Freq: Two times a day (BID) | ORAL | 1 refills | Status: DC | PRN
Start: 2021-01-21 — End: 2021-04-14

## 2021-01-21 MED ORDER — PROCHLORPERAZINE MALEATE 10 MG PO TABS: 10.0000 mg | ORAL_TABLET | Freq: Four times a day (QID) | ORAL | 1 refills | Status: DC | PRN

## 2021-01-21 NOTE — Progress Notes (Signed)
ON PATHWAY REGIMEN - Colorectal  No Change  Continue With Treatment as Ordered.  Original Decision Date/Time: 07/09/2020 15:32     A cycle is every 14 days:     Bevacizumab-xxxx      Irinotecan      Leucovorin      Fluorouracil      Fluorouracil   **Always confirm dose/schedule in your pharmacy ordering system**  Patient Characteristics: Distant Metastases, Nonsurgical Candidate, KRAS/NRAS Mutation Positive/Unknown (BRAF V600 Wild-Type/Unknown), Standard Cytotoxic Therapy, First Line Standard Cytotoxic Therapy, Bevacizumab Eligible, PS = 0,1 Tumor Location: Rectal Therapeutic Status: Distant Metastases Microsatellite/Mismatch Repair Status: Unknown BRAF Mutation Status: Awaiting Test Results KRAS/NRAS Mutation Status: Awaiting Test Results Standard Cytotoxic Line of Therapy: First Line Standard Cytotoxic Therapy ECOG Performance Status: 1 Bevacizumab Eligibility: Eligible Intent of Therapy: Non-Curative / Palliative Intent, Discussed with Patient 

## 2021-01-21 NOTE — Progress Notes (Signed)
Patient reports occasional neuropathy.

## 2021-01-21 NOTE — Progress Notes (Signed)
Hematology/Oncology progress note [facility] [phone] [phone]   Patient Care Team: Karamalegos, Alexander J, DO as PCP - General (Family Medicine) Stanton, Kristi D, RN as Registered Nurse [name], MD as Consulting Physician (Hematology and Oncology)  REFERRING PROVIDER: Dr.Secord CHIEF COMPLAINTS/REASON FOR VISIT:  Follow up for rectal cancer  HISTORY OF PRESENTING ILLNESS:  Jacqueline Yoder is a  74 y.o.  female with PMH listed below who was referred to me for evaluation of endometrial cancer.   Patient initially presented with complaints of postmenopausal bleeding on 08/16/2018.  History of was menopausal vaginal bleeding in 2016 which resulted in cervical polypectomy.  Pathology 02/04/2015 showed cervical polyp, consistent with benign endometrial polyp.  [name] after polypectomy due to anxiety associated with pelvic exams.  pelvic exam on 08/16/2018 reviewed cervical abnormality and from enlarged uterus. Seen by Dr. Cherry on 10/29/2018.  Endometrial biopsy and a Pap smear was performed. 10/29/2018 Pap smear showed adenocarcinoma, favor endometrial origin. 10/29/2018 endometrial biopsy showed endometrioid carcinoma, FIGO grade 1.  10/29/2018- TA & TV Ultrasound revealed: Anteverted uterus measuring 8.7 x 5.6 x 6.4 cm without evidence of focal masses.  The endometrium measuring 24.1 mm (thickened) and heterogeneous.  Right and left ovaries not visualized.  No adnexal masses identified.  No free fluid in cul-de-sac.  Patient was seen by Dr. Secord in clinic on 11/13/2018.  Cervical exam reveals 2 cm exophytic irregular mass consistent with malignancy.   11/19/2018 CT chest abdomen pelvis with contrast showed thickened endometrium with some irregularity compatible with the provided diagnosis of endometrial malignancy.  There is a mildly prominent left inguinal node 1.4 cm.  Patient was seen by Dr. Berchuck on 11/20/2018 and left groin  lymph node biopsy was recommended.  11/26/2018 patient underwent left inguinal lymph node biopsy. Pathology showed metastatic adenocarcinoma consistent with colorectal origin.  CDX 2+.  Case was discussed on tumor board.  Recommend colonoscopy for further evaluation.  Patient reports significant weight loss 30 pounds over the last year.  Chronic vaginal spotting. Change of bowel habits the past few months.  More constipated.  Family history positive for brother who has colon cancer prostate cancer.  patient has underwent colonoscopy on 12/03/2018 which reviewed a nonobstructing large mass in the rectum.  Also chronic fistula.  Mass was not circumferential.  This was biopsied with a cold forceps for histology.  Pathology came back hyperplastic polyp negative for dysplasia and malignancy. [name], patient underwent flex sigmoidoscopy on 12/06/2018 with rebiopsy of the rectal mass. This time biopsy results came back positive for invasive colorectal adenocarcinoma, moderately differentiated. Immunotherapy for nearly mismatch repair protein (MMR ) was performed.  There is no loss of MMR expression.  low probability of MSI high.   # Seen by [name] for evaluation of resectability for rectal cancer. In addition, she also had a second opinion with [facility] where her endometrial biopsy pathology was changed to  adenocarcinoma, consistent with colorectal primary.   [name]. She has home health that has been assisting with ostomy care  Patient was also evaluated by [facility] oncology.  Recommendation is to proceed with TNT with concurrent chemoradiation followed by neoadjuvant chemotherapy followed by surgical resection. Patient prefers to have treatment done locally with [facility].   # Oncology Treatment:  02/03/2019- 03/19/2019  concurrent Xeloda and radiation.  Xeloda dose 825mg /m2 BID - rounded to 1650mg BID- on days of  radiation. 04/09/2019, started   on FOLFOX with bolus 5-FU omitted.  07/16/2019 finished 8 cycles of FOLFOX.  # Patient has underwent 09/17/19 APR/posterior vaginectomy/TAH/BSO/VY-flap pT4b pN0 with close vaginal margin 0.2 mm.  Uterus and ovaries negative for malignancy. Patient reports bilateral lower extremity numbness and tingling, intermittent, left worse than right. She has lost a lot of weight since her APR surgery.   Surveillance plan Recommend history and physical/tumor marker monitoring every 3 to 6 months for the first 2 years after surgery and then every 6 months for a total of 5 years. Chest abdomen pelvis every 6 to 12 months for total 5 years. Colonoscopy 1 year after surgery, if no advanced adenoma, repeat in 3 years and then 5 years.  #Family history with half brother having's history of colon cancer prostate cancer.  Personal history of colorectal cancer.  Patient has not decided if she wants genetic testing.   # vaginal introitus mass biopsied. Pathology is consistent with metastatic colorectal adenocarcinoma  # history of PE( 01/13/2020)  in the bilateral lower extremity DVT (01/13/2020).   She finishes 6 months of anticoagulation with Eliquis 5 mg twice daily. Now switched to Eliquis 2.5 mg twice daily..  # She has now developed recurrent disease. I have discussed with [name]  Dr. Luciano Cutter and the mass is not resectable. Patient has also had colonoscopy by Dr. Tobi Bastos yesterday. Normal examination. # 07/16/2020 cycle 1 FOLFIRI  # 07/20/2020 PET scan was done for further evaluation, images are consistent with local recurrence, no distant metastasis. #Discussed with radiation oncology Dr. Rushie Chestnut will recommends concurrent chemotherapy and radiation. 08/02/2020-08/16/2020, patient starts radiation.  Xeloda was held due to neutropenia 08/17/2020,-09/06/2020 Xeloda 1500 mg twice daily concurrently with radiation INTERVAL HISTORY Jacqueline Yoder is a 75 y.o. female who has above  history reviewed by me presents for follow-up of rectal cancer. Patient has no new complaints. Patient was accompanied by daughter.  She reports feeling well.  Denies any shortness of breath, cough, chest pain, unintentional weight loss.  No nausea vomiting diarrhea. Patient has had CT scan done and presents to discuss reports and management plan.  Review of Systems  Constitutional: Negative for appetite change, chills, fatigue, fever and unexpected weight change.  HENT:   Negative for hearing loss and voice change.   Eyes: Negative for eye problems.  Respiratory: Negative for chest tightness and cough.   Cardiovascular: Negative for chest pain.  Gastrointestinal: Negative for abdominal distention, abdominal pain, blood in stool, constipation, diarrhea and nausea.  Endocrine: Negative for hot flashes.  Genitourinary: Negative for difficulty urinating and frequency.   Musculoskeletal: Negative for arthralgias.  Skin: Negative for itching and rash.  Neurological: Negative for extremity weakness and numbness.  Hematological: Negative for adenopathy.  Psychiatric/Behavioral: Negative for confusion.    MEDICAL HISTORY:  Past Medical History:  Diagnosis Date  . Allergy   . Arthritis   . Blood clot in vein   . Family history of colon cancer   . GERD (gastroesophageal reflux disease)   . Hypercholesteremia   . Hypertension   . Hypertension   . Lower extremity edema   . Rectal cancer (HCC) 12/2018  . Urinary incontinence     SURGICAL HISTORY: Past Surgical History:  Procedure Laterality Date  . ABDOMINAL HYSTERECTOMY    . CHOLECYSTECTOMY  1971  . COLONOSCOPY WITH PROPOFOL N/A 12/03/2018   Procedure: COLONOSCOPY WITH PROPOFOL;  Surgeon: Midge Minium, MD;  Location: [facility] ENDOSCOPY;  Service: Endoscopy;  Laterality: N/A;  . COLONOSCOPY WITH PROPOFOL N/A 07/15/2020  Procedure: COLONOSCOPY WITH PROPOFOL;  Surgeon: [name], MD;  Location: [facility] ENDOSCOPY;  Service: Gastroenterology;   Laterality: N/A;  . FLEXIBLE SIGMOIDOSCOPY N/A 12/06/2018   Procedure: FLEXIBLE SIGMOIDOSCOPY;  Surgeon: [name], MD;  Location: [facility] ENDOSCOPY;  Service: Endoscopy;  Laterality: N/A;  . LAPAROSCOPIC COLOSTOMY  01/06/2019  . PORTACATH PLACEMENT N/A 04/03/2019   Procedure: INSERTION PORT-A-CATH;  Surgeon: Jules Husbands, MD;  Location: [facility] ORS;  Service: General;  Laterality: N/A;    SOCIAL HISTORY: Social History   Socioeconomic History  . Marital status: Married    Spouse name: Not on file  . Number of children: Not on file  . Years of education: Not on file  . Highest education level: Not on file  Occupational History  . Not on file  Tobacco Use  . Smoking status: Former Smoker    Quit date: 12/02/1977    Years since quitting: 43.1  . Smokeless tobacco: Former Network engineer  . Vaping Use: Never used  Substance and Sexual Activity  . Alcohol use: Never  . Drug use: Never  . Sexual activity: Not Currently    Birth control/protection: None  Other Topics Concern  . Not on file  Social History Narrative   Lives with daughter   Social Determinants of Health   Financial Resource Strain: Not on file  Food Insecurity: Not on file  Transportation Needs: Not on file  Physical Activity: Not on file  Stress: Not on file  Social Connections: Not on file  Intimate Partner Violence: Not on file    FAMILY HISTORY: Family History  Problem Relation Age of Onset  . Colon cancer Brother 38       exposure to chemicals [place]  . Hypertension Mother   . Stroke Mother   . Kidney failure Father   . Breast cancer Neg Hx   . Ovarian cancer Neg Hx     ALLERGIES:  is allergic to sulfamethoxazole-trimethoprim.  MEDICATIONS:  Current Outpatient Medications  Medication Sig Dispense Refill  . apixaban (ELIQUIS) 2.5 MG TABS tablet Take 1 tablet (2.5 mg total) by mouth 2 (two) times daily. 60 tablet 5  . Cholecalciferol (VITAMIN D3) 2000 units capsule Take 2,000 Units by mouth  daily.    . diclofenac sodium (VOLTAREN) 1 % GEL Apply 2 g topically 4 (four) times daily as needed (joint pain).   11  . docusate sodium (COLACE) 100 MG capsule Take 100 mg by mouth 2 (two) times daily as needed.    . fexofenadine (ALLEGRA) 180 MG tablet Take 180 mg by mouth daily.    . fluticasone (FLONASE) 50 MCG/ACT nasal spray Place 1 spray into both nostrils 2 (two) times a day.     . ipratropium (ATROVENT) 0.03 % nasal spray     . lidocaine-prilocaine (EMLA) cream Apply 1 application topically as needed. 30 g 6  . loperamide (IMODIUM) 2 MG capsule Take 1 capsule (2 mg total) by mouth See admin instructions. With onset of loose stool, take $RemoveBef'4mg'XseHCjlbeP$  followed by $RemoveBef'2mg'vcebDzSDnB$  every 2 hours,  Maximum: 16 mg/day 120 capsule 1  . Multiple Vitamins-Minerals (ONE-A-DAY WOMENS 50 PLUS PO) Take 1 tablet by mouth daily.     . potassium chloride SA (K-DUR) 20 MEQ tablet Take 20 mEq by mouth daily.     . simvastatin (ZOCOR) 40 MG tablet Take 40 mg by mouth at bedtime.     . triamterene-hydrochlorothiazide (DYAZIDE) 37.5-25 MG capsule Take 1 capsule by mouth daily.     Marland Kitchen  zinc gluconate 50 MG tablet Take 50 mg by mouth daily.    Marland Kitchen apixaban (ELIQUIS) 5 MG TABS tablet TAKE ONE TABLET BY MOUTH 2 TIMES A DAY (Patient not taking: Reported on 01/21/2021) 180 tablet 3   No current facility-administered medications for this visit.     PHYSICAL EXAMINATION: ECOG PERFORMANCE STATUS: 1 - Symptomatic but completely ambulatory Vitals:   01/21/21 1028  BP: 117/77  Pulse: 73  Resp: 16  Temp: 97.8 F (36.6 C)   Filed Weights   01/21/21 1028  Weight: 170 lb (77.1 kg)    Physical Exam Constitutional:      General: She is not in acute distress. HENT:     Head: Normocephalic and atraumatic.  Eyes:     General: No scleral icterus. Cardiovascular:     Rate and Rhythm: Normal rate and regular rhythm.     Heart sounds: Normal heart sounds.  Pulmonary:     Effort: Pulmonary effort is normal. No respiratory distress.      Breath sounds: No wheezing.  Abdominal:     General: Bowel sounds are normal. There is no distension.     Palpations: Abdomen is soft.     Comments: + Colostomy bag   Musculoskeletal:        General: No deformity. Normal range of motion.     Cervical back: Normal range of motion and neck supple.  Skin:    General: Skin is warm and dry.     Findings: No erythema or rash.  Neurological:     Mental Status: She is alert and oriented to person, place, and time. Mental status is at baseline.     Cranial Nerves: No cranial nerve deficit.     Coordination: Coordination normal.  Psychiatric:        Mood and Affect: Mood normal.    CMP Latest Ref Rng & Units 01/18/2021  Glucose 70 - 99 mg/dL 93  BUN 8 - 23 mg/dL 10  Creatinine 0.44 - 1.00 mg/dL 0.82  Sodium 135 - 145 mmol/L 141  Potassium 3.5 - 5.1 mmol/L 4.2  Chloride 98 - 111 mmol/L 101  CO2 22 - 32 mmol/L 30  Calcium 8.9 - 10.3 mg/dL 9.7  Total Protein 6.5 - 8.1 g/dL 7.3  Total Bilirubin 0.3 - 1.2 mg/dL 0.5  Alkaline Phos 38 - 126 U/L 49  AST 15 - 41 U/L 33  ALT 0 - 44 U/L 19   CBC Latest Ref Rng & Units 01/18/2021  WBC 4.0 - 10.5 K/uL 3.1(L)  Hemoglobin 12.0 - 15.0 g/dL 12.5  Hematocrit 36.0 - 46.0 % 38.7  Platelets 150 - 400 K/uL 204    LABORATORY DATA:  I have reviewed the data as listed Lab Results  Component Value Date   WBC 3.1 (L) 01/18/2021   HGB 12.5 01/18/2021   HCT 38.7 01/18/2021   MCV 92.6 01/18/2021   PLT 204 01/18/2021   Recent Labs    03/19/20 0826 06/23/20 1124 07/16/20 0802 07/23/20 1345 09/02/20 1028 10/20/20 0858 01/18/21 1041  NA 142 140 140   < > 138 136 141  K 4.1 3.7 3.2*   < > 3.8 3.6 4.2  CL 102 100 101   < > 99 98 101  CO2 $Re'31 30 29   'uyr$ < > $R'30 27 30  'em$ GLUCOSE 95 114* 114*   < > 91 97 93  BUN $Re'12 10 9   'SEY$ < > $R'10 10 10  'Kf$ CREATININE 1.01* 0.97 0.87   < >  0.72 0.85 0.82  CALCIUM 9.9 9.1 9.2   < > 9.5 9.3 9.7  GFRNONAA 56* 58* >60   < > >60 >60 >60  GFRAA >60 >60 >60  --   --   --   --   PROT  7.5 7.0 6.9   < > 7.1 6.9 7.3  ALBUMIN 4.4 4.0 3.6   < > 4.1 4.1 4.2  AST $Re'25 25 24   'YeQ$ < > 28 34 33  ALT $Re'16 15 14   'XeW$ < > $R'14 19 19  'An$ ALKPHOS 37* 41 41   < > 35* 45 49  BILITOT 0.8 0.4 0.5   < > 0.5 0.7 0.5   < > = values in this interval not displayed.   Iron/TIBC/Ferritin/ %Sat    Component Value Date/Time   IRON 40 06/23/2020 1124   TIBC 246 (L) 06/23/2020 1124   FERRITIN 338 (H) 06/23/2020 1124   IRONPCTSAT 16 06/23/2020 1124     RADIOGRAPHIC STUDIES: I have personally reviewed the radiological images as listed and agreed with the findings in the report. CT CHEST ABDOMEN PELVIS W CONTRAST  Result Date: 01/20/2021 CLINICAL DATA:  Restaging rectal cancer diagnosed 03/05/2019. Status post APR with vaginal recurrence. EXAM: CT CHEST, ABDOMEN, AND PELVIS WITH CONTRAST TECHNIQUE: Multidetector CT imaging of the chest, abdomen and pelvis was performed following the standard protocol during bolus administration of intravenous contrast. CONTRAST:  135mL OMNIPAQUE IOHEXOL 300 MG/ML  SOLN COMPARISON:  Multiple priors including PET-CT July 20, 2020 and CT chest abdomen and pelvis October 20, 2020 FINDINGS: CT CHEST FINDINGS Cardiovascular: Right chest wall Port-A-Cath with tip in the right atrium. Atherosclerotic thoracic aortic calcifications. No thoracic aortic aneurysm. No central pulmonary embolus. Normal size heart. No significant pericardial effusion/thickening. Mediastinum/Nodes: No discrete thyroid nodule. The trachea and esophagus are unremarkable. No pathologically enlarged mediastinal, hilar or axillary lymph nodes. Lungs/Pleura: There are multiple scattered new and/or enlarging bilateral pulmonary nodules. For instance: Increased size of the left upper lobe pulmonary nodule which now measures 4 mm on image 37/4 previously 3 mm. Increased size of a right upper lobe pulmonary nodule which now measures 3 mm on image 47/4 previously 2 mm. Increased size of the a right middle lobe pulmonary nodule  which now measures 8 mm previously 6 mm. New 2 mm left upper lobe pulmonary nodule on image 21/4. New 3 mm right upper lobe pulmonary nodule on image 68/4. No pleural effusion.  No pneumothorax.  No focal consolidation. Musculoskeletal: No aggressive lytic or blastic lesions of bone. CT ABDOMEN PELVIS FINDINGS Hepatobiliary: No suspicious hepatic lesions. Gallbladder is surgically absent. No biliary ductal dilation. Pancreas: Within normal limits. Spleen: Within normal limits. Adrenals/Urinary Tract: Bilateral adrenal glands are unremarkable. No hydronephrosis. Bilateral renal cysts. No solid enhancing renal lesions. Urinary bladder is grossly unremarkable for degree of distension Stomach/Bowel: The stomach and small bowel are grossly unremarkable. The terminal ileum and appendix are both unremarkable. No acute inflammatory process or obstructive findings. Stable surgical changes of APR and left lower quadrant colostomy. There is mild colonic wall thickening involving the distal transverse colon at the splenic flexure which appears similar to prior and may be related to under distension. Colonic diverticulosis without findings of acute diverticulitis. Vascular/Lymphatic: Aortic and branch vessel atherosclerosis. No gastrohepatic or hepatoduodenal ligament lymphadenopathy. No retroperitoneal or mesenteric lymphadenopathy. No pelvic sidewall lymphadenopathy. New mildly enlarged bilateral inguinal lymph nodes which measures 1.2 cm on the right on image 104/3 and 1.4 cm on the left on image  102/3. Reproductive: Surgically absent Other: Persistent moderate vaginal wall thickening without discrete mass visualized. Musculoskeletal: Multilevel degenerative changes spine. Post radiation change in the pelvis and sacrum. No aggressive lytic or blastic lesion of bone. IMPRESSION: 1. Multiple scattered new and/or enlarging bilateral pulmonary nodules, consistent with pulmonary metastatic disease. 2. Persistent moderate vaginal  wall thickening without discrete mass. New mildly enlarged bilateral inguinal lymph nodes, nonspecific but concerning for disease involvement. 3. Mild colonic wall thickening involving the distal transverse colon at the splenic flexure which appears similar to prior and may be related to under distension. Consider attention on follow-up imaging versus colonoscopy. 4. Stable surgical changes of APR and left lower quadrant colostomy. 5. Aortic atherosclerosis. Aortic Atherosclerosis (ICD10-I70.0). Electronically Signed   By: Dahlia Bailiff MD   On: 01/20/2021 11:23   MM 3D SCREEN BREAST BILATERAL  Result Date: 01/12/2021 CLINICAL DATA:  Screening. EXAM: DIGITAL SCREENING BILATERAL MAMMOGRAM WITH TOMOSYNTHESIS AND CAD TECHNIQUE: Bilateral screening digital craniocaudal and mediolateral oblique mammograms were obtained. Bilateral screening digital breast tomosynthesis was performed. The images were evaluated with computer-aided detection. COMPARISON:  Previous exam(s). ACR Breast Density Category b: There are scattered areas of fibroglandular density. FINDINGS: There are no findings suspicious for malignancy. The images were evaluated with computer-aided detection. IMPRESSION: No mammographic evidence of malignancy. A result letter of this screening mammogram will be mailed directly to the patient. RECOMMENDATION: Screening mammogram in one year. (Code:SM-B-01Y) BI-RADS CATEGORY  1: Negative. Electronically Signed   By: Dorise Bullion III M.D   On: 01/12/2021 16:28    CT CHEST ABDOMEN PELVIS W CONTRAST  Result Date: 01/20/2021 CLINICAL DATA:  Restaging rectal cancer diagnosed 03/05/2019. Status post APR with vaginal recurrence. EXAM: CT CHEST, ABDOMEN, AND PELVIS WITH CONTRAST TECHNIQUE: Multidetector CT imaging of the chest, abdomen and pelvis was performed following the standard protocol during bolus administration of intravenous contrast. CONTRAST:  147mL OMNIPAQUE IOHEXOL 300 MG/ML  SOLN COMPARISON:   Multiple priors including PET-CT July 20, 2020 and CT chest abdomen and pelvis October 20, 2020 FINDINGS: CT CHEST FINDINGS Cardiovascular: Right chest wall Port-A-Cath with tip in the right atrium. Atherosclerotic thoracic aortic calcifications. No thoracic aortic aneurysm. No central pulmonary embolus. Normal size heart. No significant pericardial effusion/thickening. Mediastinum/Nodes: No discrete thyroid nodule. The trachea and esophagus are unremarkable. No pathologically enlarged mediastinal, hilar or axillary lymph nodes. Lungs/Pleura: There are multiple scattered new and/or enlarging bilateral pulmonary nodules. For instance: Increased size of the left upper lobe pulmonary nodule which now measures 4 mm on image 37/4 previously 3 mm. Increased size of a right upper lobe pulmonary nodule which now measures 3 mm on image 47/4 previously 2 mm. Increased size of the a right middle lobe pulmonary nodule which now measures 8 mm previously 6 mm. New 2 mm left upper lobe pulmonary nodule on image 21/4. New 3 mm right upper lobe pulmonary nodule on image 68/4. No pleural effusion.  No pneumothorax.  No focal consolidation. Musculoskeletal: No aggressive lytic or blastic lesions of bone. CT ABDOMEN PELVIS FINDINGS Hepatobiliary: No suspicious hepatic lesions. Gallbladder is surgically absent. No biliary ductal dilation. Pancreas: Within normal limits. Spleen: Within normal limits. Adrenals/Urinary Tract: Bilateral adrenal glands are unremarkable. No hydronephrosis. Bilateral renal cysts. No solid enhancing renal lesions. Urinary bladder is grossly unremarkable for degree of distension Stomach/Bowel: The stomach and small bowel are grossly unremarkable. The terminal ileum and appendix are both unremarkable. No acute inflammatory process or obstructive findings. Stable surgical changes of APR and left lower quadrant colostomy.  There is mild colonic wall thickening involving the distal transverse colon at the splenic  flexure which appears similar to prior and may be related to under distension. Colonic diverticulosis without findings of acute diverticulitis. Vascular/Lymphatic: Aortic and branch vessel atherosclerosis. No gastrohepatic or hepatoduodenal ligament lymphadenopathy. No retroperitoneal or mesenteric lymphadenopathy. No pelvic sidewall lymphadenopathy. New mildly enlarged bilateral inguinal lymph nodes which measures 1.2 cm on the right on image 104/3 and 1.4 cm on the left on image 102/3. Reproductive: Surgically absent Other: Persistent moderate vaginal wall thickening without discrete mass visualized. Musculoskeletal: Multilevel degenerative changes spine. Post radiation change in the pelvis and sacrum. No aggressive lytic or blastic lesion of bone. IMPRESSION: 1. Multiple scattered new and/or enlarging bilateral pulmonary nodules, consistent with pulmonary metastatic disease. 2. Persistent moderate vaginal wall thickening without discrete mass. New mildly enlarged bilateral inguinal lymph nodes, nonspecific but concerning for disease involvement. 3. Mild colonic wall thickening involving the distal transverse colon at the splenic flexure which appears similar to prior and may be related to under distension. Consider attention on follow-up imaging versus colonoscopy. 4. Stable surgical changes of APR and left lower quadrant colostomy. 5. Aortic atherosclerosis. Aortic Atherosclerosis (ICD10-I70.0). Electronically Signed   By: Dahlia Bailiff MD   On: 01/20/2021 11:23   MM 3D SCREEN BREAST BILATERAL  Result Date: 01/12/2021 CLINICAL DATA:  Screening. EXAM: DIGITAL SCREENING BILATERAL MAMMOGRAM WITH TOMOSYNTHESIS AND CAD TECHNIQUE: Bilateral screening digital craniocaudal and mediolateral oblique mammograms were obtained. Bilateral screening digital breast tomosynthesis was performed. The images were evaluated with computer-aided detection. COMPARISON:  Previous exam(s). ACR Breast Density Category b: There are  scattered areas of fibroglandular density. FINDINGS: There are no findings suspicious for malignancy. The images were evaluated with computer-aided detection. IMPRESSION: No mammographic evidence of malignancy. A result letter of this screening mammogram will be mailed directly to the patient. RECOMMENDATION: Screening mammogram in one year. (Code:SM-B-01Y) BI-RADS CATEGORY  1: Negative. Electronically Signed   By: Dorise Bullion III M.D   On: 01/12/2021 16:28    ASSESSMENT & PLAN:  1. Port-A-Cath in place   2. Rectal cancer ([place])   3. History of pulmonary embolism   4. Metastatic adenocarcinoma ([place])   5. History of DVT (deep vein thrombosis)   6. Goals of care, counseling/discussion   Cancer Staging Rectal cancer [facility]) Staging form: Colon and Rectum, AJCC 8th Edition - Clinical stage from 01/23/2019: Stage IIIC (cT4b, cN1a, cM0) - Signed by Earlie Server, MD on 01/23/2019 - Pathologic stage from 10/06/2019: Stage IIC (ypT4b, pN0, cM0) - Signed by Earlie Server, MD on 10/06/2019  #History of stage IIIC Rectal cancer, s/p TNT, followed by 09/17/19 APR/posterior vaginectomy/TAH/BSO/VY-flap pT4b pN0 with close vaginal margin 0.2 mm.  Uterus and ovaries negative for malignancy. palliative radiation to vaginal recurrence.  Finished 09/04/2020. Recurrent metastatic rectal cancer with lung metastasis. 01/19/2021, CT chest abdomen pelvis with contrast Multiple scattered new and /or enlarging bilateral pulmonary nodules, consistent with pulmonary metastasis disease. Persistent moderate vaginal wall thickening without discrete mass.  New mildly enlarged bilateral inguinal lymph nodes nonspecific but concerning for disease involvement. Mild colonic wall thickening involving the distal transverse colon at the splenic fracture which appears similar to prior.  Stable surgical changes of APR and  left lower quadrant colostomy.  Aortic atherosclerosis.  Discussed with patient and daughter that patient has stage IV  metastatic rectal cancer, and her condition is not curable.  Goals of care is palliative. She was previously treated with 1 cycle of FOLFIRI prior to  switching to Xeloda concurrent radiation treatments for the recurrence.  She tolerated well.  I recommend systemic chemotherapy with FOLFIRI plus bevacizumab.  Rationale and potential side effects were discussed with patient.  And she agrees with the plan.  History of pulmonary embolism, history of bilateral DVT continue Eliquis 2.5 mg twice daily.   Follow-up on day 1 of FOLFIRI/bevacizumab.  Earlie Server, MD, PhD

## 2021-01-21 NOTE — Progress Notes (Signed)
DISCONTINUE ON PATHWAY REGIMEN - Colorectal     A cycle is every 14 days:     Bevacizumab-xxxx      Irinotecan      Leucovorin      Fluorouracil      Fluorouracil   **Always confirm dose/schedule in your pharmacy ordering system**  REASON: Disease Progression PRIOR TREATMENT: [id number]: FOLFIRI + Bevacizumab q14 Days TREATMENT RESPONSE: Progressive Disease (PD)  START ON PATHWAY REGIMEN - Colorectal     A cycle is every 14 days:     Bevacizumab-xxxx      Irinotecan      Leucovorin      Fluorouracil      Fluorouracil   **Always confirm dose/schedule in your pharmacy ordering system**  Patient Characteristics: Distant Metastases, Nonsurgical Candidate, KRAS/NRAS Wild-Type (BRAF V600 Wild-Type/Unknown), Standard Cytotoxic Therapy, Second Line Standard Cytotoxic Therapy, Bevacizumab Eligible Tumor Location: Rectal Therapeutic Status: Distant Metastases Microsatellite/Mismatch Repair Status: MSS/pMMR BRAF Mutation Status: Wild-Type (no mutation) KRAS/NRAS Mutation Status: Wild-Type (no mutation) Preferred Therapy Approach: Standard Cytotoxic Therapy Standard Cytotoxic Line of Therapy: Second Line Standard Cytotoxic Therapy Bevacizumab Eligibility: Eligible Intent of Therapy: Non-Curative / Palliative Intent, Discussed with Patient

## 2021-01-25 DIAGNOSIS — Z933 Colostomy status: Secondary | ICD-10-CM | POA: Diagnosis not present

## 2021-01-28 DIAGNOSIS — Z933 Colostomy status: Secondary | ICD-10-CM | POA: Diagnosis not present

## 2021-01-31 ENCOUNTER — Inpatient Hospital Stay: Payer: Medicare Other

## 2021-01-31 ENCOUNTER — Encounter: Payer: Self-pay | Admitting: Oncology

## 2021-01-31 ENCOUNTER — Inpatient Hospital Stay: Payer: Medicare Other | Admitting: Oncology

## 2021-01-31 ENCOUNTER — Telehealth: Payer: Self-pay

## 2021-01-31 ENCOUNTER — Other Ambulatory Visit: Payer: Self-pay

## 2021-01-31 ENCOUNTER — Inpatient Hospital Stay (HOSPITAL_BASED_OUTPATIENT_CLINIC_OR_DEPARTMENT_OTHER): Payer: Medicare Other | Admitting: Hospice and Palliative Medicine

## 2021-01-31 VITALS — BP 113/74 | HR 68 | Resp 18

## 2021-01-31 VITALS — BP 111/56 | HR 65 | Temp 97.0°F | Resp 20 | Wt 170.5 lb

## 2021-01-31 DIAGNOSIS — C20 Malignant neoplasm of rectum: Secondary | ICD-10-CM | POA: Diagnosis not present

## 2021-01-31 DIAGNOSIS — Z86711 Personal history of pulmonary embolism: Secondary | ICD-10-CM

## 2021-01-31 DIAGNOSIS — R350 Frequency of micturition: Secondary | ICD-10-CM | POA: Diagnosis not present

## 2021-01-31 DIAGNOSIS — C799 Secondary malignant neoplasm of unspecified site: Secondary | ICD-10-CM

## 2021-01-31 DIAGNOSIS — Z5111 Encounter for antineoplastic chemotherapy: Secondary | ICD-10-CM | POA: Diagnosis not present

## 2021-01-31 DIAGNOSIS — Z95828 Presence of other vascular implants and grafts: Secondary | ICD-10-CM | POA: Diagnosis not present

## 2021-01-31 DIAGNOSIS — L509 Urticaria, unspecified: Secondary | ICD-10-CM

## 2021-01-31 DIAGNOSIS — Z86718 Personal history of other venous thrombosis and embolism: Secondary | ICD-10-CM | POA: Diagnosis not present

## 2021-01-31 DIAGNOSIS — Z800 Family history of malignant neoplasm of digestive organs: Secondary | ICD-10-CM | POA: Diagnosis not present

## 2021-01-31 DIAGNOSIS — I10 Essential (primary) hypertension: Secondary | ICD-10-CM | POA: Diagnosis not present

## 2021-01-31 DIAGNOSIS — Z7189 Other specified counseling: Secondary | ICD-10-CM

## 2021-01-31 DIAGNOSIS — R609 Edema, unspecified: Secondary | ICD-10-CM | POA: Diagnosis not present

## 2021-01-31 DIAGNOSIS — Z85048 Personal history of other malignant neoplasm of rectum, rectosigmoid junction, and anus: Secondary | ICD-10-CM | POA: Diagnosis not present

## 2021-01-31 DIAGNOSIS — E7800 Pure hypercholesterolemia, unspecified: Secondary | ICD-10-CM | POA: Diagnosis not present

## 2021-01-31 DIAGNOSIS — N281 Cyst of kidney, acquired: Secondary | ICD-10-CM | POA: Diagnosis not present

## 2021-01-31 DIAGNOSIS — I700 Atherosclerosis of aorta: Secondary | ICD-10-CM | POA: Diagnosis not present

## 2021-01-31 DIAGNOSIS — K219 Gastro-esophageal reflux disease without esophagitis: Secondary | ICD-10-CM | POA: Diagnosis not present

## 2021-01-31 DIAGNOSIS — K521 Toxic gastroenteritis and colitis: Secondary | ICD-10-CM | POA: Diagnosis not present

## 2021-01-31 DIAGNOSIS — Z515 Encounter for palliative care: Secondary | ICD-10-CM | POA: Diagnosis not present

## 2021-01-31 DIAGNOSIS — Z79899 Other long term (current) drug therapy: Secondary | ICD-10-CM | POA: Diagnosis not present

## 2021-01-31 DIAGNOSIS — Z87891 Personal history of nicotine dependence: Secondary | ICD-10-CM | POA: Diagnosis not present

## 2021-01-31 LAB — URINALYSIS, DIPSTICK ONLY
Bacteria, UA: NONE SEEN
Bilirubin Urine: NEGATIVE
Glucose, UA: NEGATIVE mg/dL
Hgb urine dipstick: NEGATIVE
Ketones, ur: NEGATIVE mg/dL
Nitrite: NEGATIVE
Protein, ur: NEGATIVE mg/dL
Specific Gravity, Urine: 1.005 (ref 1.005–1.030)
pH: 9 — ABNORMAL HIGH (ref 5.0–8.0)

## 2021-01-31 LAB — CBC WITH DIFFERENTIAL/PLATELET
Abs Immature Granulocytes: 0 10*3/uL (ref 0.00–0.07)
Basophils Absolute: 0 10*3/uL (ref 0.0–0.1)
Basophils Relative: 0 %
Eosinophils Absolute: 0.1 10*3/uL (ref 0.0–0.5)
Eosinophils Relative: 4 %
HCT: 37.4 % (ref 36.0–46.0)
Hemoglobin: 12.2 g/dL (ref 12.0–15.0)
Immature Granulocytes: 0 %
Lymphocytes Relative: 37 %
Lymphs Abs: 1.2 10*3/uL (ref 0.7–4.0)
MCH: 30 pg (ref 26.0–34.0)
MCHC: 32.6 g/dL (ref 30.0–36.0)
MCV: 91.9 fL (ref 80.0–100.0)
Monocytes Absolute: 0.4 10*3/uL (ref 0.1–1.0)
Monocytes Relative: 13 %
Neutro Abs: 1.4 10*3/uL — ABNORMAL LOW (ref 1.7–7.7)
Neutrophils Relative %: 46 %
Platelets: 186 10*3/uL (ref 150–400)
RBC: 4.07 MIL/uL (ref 3.87–5.11)
RDW: 13.5 % (ref 11.5–15.5)
WBC: 3.1 10*3/uL — ABNORMAL LOW (ref 4.0–10.5)
nRBC: 0 % (ref 0.0–0.2)

## 2021-01-31 LAB — COMPREHENSIVE METABOLIC PANEL
ALT: 23 U/L (ref 0–44)
AST: 36 U/L (ref 15–41)
Albumin: 4.1 g/dL (ref 3.5–5.0)
Alkaline Phosphatase: 46 U/L (ref 38–126)
Anion gap: 8 (ref 5–15)
BUN: 12 mg/dL (ref 8–23)
CO2: 28 mmol/L (ref 22–32)
Calcium: 9.6 mg/dL (ref 8.9–10.3)
Chloride: 102 mmol/L (ref 98–111)
Creatinine, Ser: 0.89 mg/dL (ref 0.44–1.00)
GFR, Estimated: >60 mL/min (ref >60)
Glucose, Bld: 99 mg/dL (ref 70–99)
Potassium: 3.5 mmol/L (ref 3.5–5.1)
Sodium: 138 mmol/L (ref 135–145)
Total Bilirubin: 0.8 mg/dL (ref 0.3–1.2)
Total Protein: 7 g/dL (ref 6.5–8.1)

## 2021-01-31 MED ORDER — FAMOTIDINE 20 MG IN NS 100 ML IVPB: 20.0000 mg | Freq: Once | INTRAVENOUS | Status: DC

## 2021-01-31 MED ORDER — SODIUM CHLORIDE 0.9 % IV SOLN
5.0000 mg/kg | Freq: Once | INTRAVENOUS | Status: AC
  Administered 2021-01-31: 400 mg via INTRAVENOUS
  Filled 2021-01-31: qty 16

## 2021-01-31 MED ORDER — SODIUM CHLORIDE 0.9 % IV SOLN
2400.0000 mg/m2 | INTRAVENOUS | Status: DC
  Administered 2021-01-31: 4500 mg via INTRAVENOUS
  Filled 2021-01-31: qty 90

## 2021-01-31 MED ORDER — GABAPENTIN 100 MG PO CAPS: 100.0000 mg | ORAL_CAPSULE | Freq: Every day | ORAL | 0 refills | Status: DC

## 2021-01-31 MED ORDER — METHYLPREDNISOLONE SODIUM SUCC 125 MG IJ SOLR
125.0000 mg | Freq: Once | INTRAMUSCULAR | Status: AC
Start: 2021-01-31 — End: 2021-01-31
  Administered 2021-01-31: 125 mg via INTRAVENOUS

## 2021-01-31 MED ORDER — DIPHENHYDRAMINE HCL 50 MG/ML IJ SOLN
25.0000 mg | Freq: Once | INTRAMUSCULAR | Status: AC
  Administered 2021-01-31: 25 mg via INTRAVENOUS

## 2021-01-31 MED ORDER — SODIUM CHLORIDE 0.9 % IV SOLN
150.0000 mg/m2 | Freq: Once | INTRAVENOUS | Status: AC
  Administered 2021-01-31: 280 mg via INTRAVENOUS
  Filled 2021-01-31: qty 10

## 2021-01-31 MED ORDER — ATROPINE SULFATE 1 MG/ML IJ SOLN
0.5000 mg | Freq: Once | INTRAMUSCULAR | Status: AC
  Administered 2021-01-31: 0.5 mg via INTRAVENOUS
  Filled 2021-01-31: qty 1

## 2021-01-31 MED ORDER — DEXAMETHASONE SODIUM PHOSPHATE 100 MG/10ML IJ SOLN
10.0000 mg | Freq: Once | INTRAMUSCULAR | Status: AC
  Administered 2021-01-31: 10 mg via INTRAVENOUS
  Filled 2021-01-31: qty 10

## 2021-01-31 MED ORDER — SODIUM CHLORIDE 0.9 % IV SOLN
750.0000 mg | Freq: Once | INTRAVENOUS | Status: AC
  Administered 2021-01-31: 750 mg via INTRAVENOUS
  Filled 2021-01-31: qty 37.5

## 2021-01-31 MED ORDER — FLUOROURACIL CHEMO INJECTION 2.5 GM/50ML
400.0000 mg/m2 | Freq: Once | INTRAVENOUS | Status: AC
  Administered 2021-01-31: 750 mg via INTRAVENOUS
  Filled 2021-01-31: qty 15

## 2021-01-31 MED ORDER — FAMOTIDINE 20 MG IN NS 100 ML IVPB
20.0000 mg | Freq: Once | INTRAVENOUS | Status: AC
  Administered 2021-01-31: 20 mg via INTRAVENOUS
  Filled 2021-01-31: qty 100

## 2021-01-31 MED ORDER — SODIUM CHLORIDE 0.9 % IV SOLN
Freq: Once | INTRAVENOUS | Status: AC
  Filled 2021-01-31: qty 250

## 2021-01-31 MED ORDER — LOPERAMIDE HCL 2 MG PO CAPS: 2.0000 mg | ORAL_CAPSULE | ORAL | 1 refills | Status: DC

## 2021-01-31 MED ORDER — PALONOSETRON HCL INJECTION 0.25 MG/5ML
0.2500 mg | Freq: Once | INTRAVENOUS | Status: AC
  Administered 2021-01-31: 0.25 mg via INTRAVENOUS
  Filled 2021-01-31: qty 5

## 2021-01-31 NOTE — Progress Notes (Signed)
ANC 1.4. Per Esau Grew., CMA per Dr. Tasia Catchings, okay to proceed with treatment.   1342: After 5FU push getting ready to connect 5FU pump, patient was noted to have hives across chest and bilateral upper arms. Patient denies any other s/s at this time. 1344: BP 100/65, HR 76, R 18 1345: Rulon Abide, NP made aware.  1347Sonia Baller, NP at chairside and states to give patient Benadryl 25 MG, Solumedrol 125 MG, and Pepcid IVP.  1350: Benadryl and Solumedrol given.  1352: Pepcid started and completed at 1356.  Per Sonia Baller, NP monitor patient for 10 minutes, connect to 5FU pump, and okay to discharge home.   1406: Hives are improving. BP 113/74, HR 68, 99% RA  Patient advised by Sonia Baller, NP to take Pepcid 20 MG BID and Benadryl 25 MG BID x 2 days while wearing 5FU pump. Patient educated on s/s as to when to seek emergency care and to contact the clinic with any questions or concerns.   Patient monitored for 15 minutes and improved. Discharged home with daughter.

## 2021-01-31 NOTE — Telephone Encounter (Signed)
FMLA form for patient's daughter, Jacqueline Yoder, complete and faxed to [employer] (Leave ID: [id number]).  Ms. Zenia Resides notified [phone]) FMLA forms have been submitted.

## 2021-01-31 NOTE — Progress Notes (Signed)
Hematology/Oncology progress note [facility] Telephone:([phone] [phone]   Patient Care Team: Olin Hauser, DO as PCP - General (Family Medicine) Clent Jacks, RN as Registered Nurse Earlie Server, MD as Consulting Physician (Hematology and Oncology)  REFERRING PROVIDER: Dr.Secord CHIEF COMPLAINTS/REASON FOR VISIT:  Follow up for rectal cancer  HISTORY OF PRESENTING ILLNESS:  Jacqueline Yoder is a  75 y.o.  female with PMH listed below who was referred to me for evaluation of endometrial cancer.   Patient initially presented with complaints of postmenopausal bleeding on 08/16/2018.  History of was menopausal vaginal bleeding in 2016 which resulted in cervical polypectomy.  Pathology 02/04/2015 showed cervical polyp, consistent with benign endometrial polyp.  [name] after polypectomy due to anxiety associated with pelvic exams.  pelvic exam on 08/16/2018 reviewed cervical abnormality and from enlarged uterus. Seen by Dr. Marcelline Mates on 10/29/2018.  Endometrial biopsy and a Pap smear was performed. 10/29/2018 Pap smear showed adenocarcinoma, favor endometrial origin. 10/29/2018 endometrial biopsy showed endometrioid carcinoma, FIGO grade 1.  10/29/2018- TA & TV Ultrasound revealed: Anteverted uterus measuring 8.7 x 5.6 x 6.4 cm without evidence of focal masses.  The endometrium measuring 24.1 mm (thickened) and heterogeneous.  Right and left ovaries not visualized.  No adnexal masses identified.  No free fluid in cul-de-sac.  Patient was seen by Dr. Theora Gianotti in clinic on 11/13/2018.  Cervical exam reveals 2 cm exophytic irregular mass consistent with malignancy.   11/19/2018 CT chest abdomen pelvis with contrast showed thickened endometrium with some irregularity compatible with the provided diagnosis of endometrial malignancy.  There is a mildly prominent left inguinal node 1.4 cm.  Patient was seen by Dr. Fransisca Connors on 11/20/2018 and left groin  lymph node biopsy was recommended.  11/26/2018 patient underwent left inguinal lymph node biopsy. Pathology showed metastatic adenocarcinoma consistent with colorectal origin.  CDX 2+.  Case was discussed on tumor board.  Recommend colonoscopy for further evaluation.  Patient reports significant weight loss 30 pounds over the last year.  Chronic vaginal spotting. Change of bowel habits the past few months.  More constipated.  Family history positive for brother who has colon cancer prostate cancer.  patient has underwent colonoscopy on 12/03/2018 which reviewed a nonobstructing large mass in the rectum.  Also chronic fistula.  Mass was not circumferential.  This was biopsied with a cold forceps for histology.  Pathology came back hyperplastic polyp negative for dysplasia and malignancy. [name], patient underwent flex sigmoidoscopy on 12/06/2018 with rebiopsy of the rectal mass. This time biopsy results came back positive for invasive colorectal adenocarcinoma, moderately differentiated. Immunotherapy for nearly mismatch repair protein (MMR ) was performed.  There is no loss of MMR expression.  low probability of MSI high.   # Seen by [name] for evaluation of resectability for rectal cancer. In addition, she also had a second opinion with [facility] where her endometrial biopsy pathology was changed to  adenocarcinoma, consistent with colorectal primary.   [name]. She has home health that has been assisting with ostomy care  Patient was also evaluated by [facility] oncology.  Recommendation is to proceed with TNT with concurrent chemoradiation followed by neoadjuvant chemotherapy followed by surgical resection. Patient prefers to have treatment done locally with [facility].   # Oncology Treatment:  02/03/2019- 03/19/2019  concurrent Xeloda and radiation.  Xeloda dose 865m /m2 BID - rounded to 16567mBID- on days of  radiation. 04/09/2019, started  on FOLFOX with bolus 5-FU omitted.  07/16/2019 finished 8 cycles of FOLFOX.  # Patient has underwent 09/17/19 APR/posterior vaginectomy/TAH/BSO/VY-flap pT4b pN0 with close vaginal margin 0.2 mm.  Uterus and ovaries negative for malignancy. Patient reports bilateral lower extremity numbness and tingling, intermittent, left worse than right. She has lost a lot of weight since her APR surgery.   Surveillance plan Recommend history and physical/tumor marker monitoring every 3 to 6 months for the first 2 years after surgery and then every 6 months for a total of 5 years. Chest abdomen pelvis every 6 to 12 months for total 5 years. Colonoscopy 1 year after surgery, if no advanced adenoma, repeat in 3 years and then 5 years.  #Family history with half brother having's history of colon cancer prostate cancer.  Personal history of colorectal cancer.  Patient has not decided if she wants genetic testing.   # vaginal introitus mass biopsied. Pathology is consistent with metastatic colorectal adenocarcinoma  # history of PE( 01/13/2020)  in the bilateral lower extremity DVT (01/13/2020).   She finishes 6 months of anticoagulation with Eliquis 5 mg twice daily. Now switched to Eliquis 2.5 mg twice daily..  # She has now developed recurrent disease. I have discussed with [name]  Dr. Mantyh and the mass is not resectable. Patient has also had colonoscopy by Dr. Anna yesterday. Normal examination. # 07/16/2020 cycle 1 FOLFIRI  # 07/20/2020 PET scan was done for further evaluation, images are consistent with local recurrence, no distant metastasis. #Discussed with radiation oncology Dr. Chrystal will recommends concurrent chemotherapy and radiation. 08/02/2020-08/16/2020, patient starts radiation.  Xeloda was held due to neutropenia 08/17/2020,-09/06/2020 Xeloda 1500 mg twice daily concurrently with radiation INTERVAL HISTORY Jacqueline Yoder is a 74 y.o. female who has above  history reviewed by me presents for follow-up of rectal cancer. Patient has no new complaints today.  Denies any fever, chills, nausea vomiting diarrhea today.  Review of Systems  Constitutional: Negative for appetite change, chills, fatigue, fever and unexpected weight change.  HENT:   Negative for hearing loss and voice change.   Eyes: Negative for eye problems.  Respiratory: Negative for chest tightness and cough.   Cardiovascular: Negative for chest pain.  Gastrointestinal: Negative for abdominal distention, abdominal pain, blood in stool, constipation, diarrhea and nausea.  Endocrine: Negative for hot flashes.  Genitourinary: Negative for difficulty urinating and frequency.   Musculoskeletal: Negative for arthralgias.  Skin: Negative for itching and rash.  Neurological: Negative for extremity weakness and numbness.  Hematological: Negative for adenopathy.  Psychiatric/Behavioral: Negative for confusion.    MEDICAL HISTORY:  Past Medical History:  Diagnosis Date  . Allergy   . Arthritis   . Blood clot in vein   . Family history of colon cancer   . GERD (gastroesophageal reflux disease)   . Hypercholesteremia   . Hypertension   . Hypertension   . Lower extremity edema   . Rectal cancer (HCC) 12/2018  . Urinary incontinence     SURGICAL HISTORY: Past Surgical History:  Procedure Laterality Date  . ABDOMINAL HYSTERECTOMY    . CHOLECYSTECTOMY  1971  . COLONOSCOPY WITH PROPOFOL N/A 12/03/2018   Procedure: COLONOSCOPY WITH PROPOFOL;  Surgeon: Wohl, Darren, MD;  Location: [facility] ENDOSCOPY;  Service: Endoscopy;  Laterality: N/A;  . COLONOSCOPY WITH PROPOFOL N/A 07/15/2020   Procedure: COLONOSCOPY WITH PROPOFOL;  Surgeon: Anna, Kiran, MD;  Location: [facility] ENDOSCOPY;  Service: Gastroenterology;  Laterality: N/A;  . FLEXIBLE SIGMOIDOSCOPY N/A 12/06/2018   Procedure: FLEXIBLE   SIGMOIDOSCOPY;  Surgeon: Anna, Kiran, MD;  Location: [facility] ENDOSCOPY;  Service: Endoscopy;  Laterality: N/A;  .  LAPAROSCOPIC COLOSTOMY  01/06/2019  . PORTACATH PLACEMENT N/A 04/03/2019   Procedure: INSERTION PORT-A-CATH;  Surgeon: Pabon, Diego F, MD;  Location: [facility] ORS;  Service: General;  Laterality: N/A;    SOCIAL HISTORY: Social History   Socioeconomic History  . Marital status: Married    Spouse name: Not on file  . Number of children: Not on file  . Years of education: Not on file  . Highest education level: Not on file  Occupational History  . Not on file  Tobacco Use  . Smoking status: Former Smoker    Quit date: 12/02/1977    Years since quitting: 43.1  . Smokeless tobacco: Former User  Vaping Use  . Vaping Use: Never used  Substance and Sexual Activity  . Alcohol use: Never  . Drug use: Never  . Sexual activity: Not Currently    Birth control/protection: None  Other Topics Concern  . Not on file  Social History Narrative   Lives with daughter   Social Determinants of Health   Financial Resource Strain: Not on file  Food Insecurity: Not on file  Transportation Needs: Not on file  Physical Activity: Not on file  Stress: Not on file  Social Connections: Not on file  Intimate Partner Violence: Not on file    FAMILY HISTORY: Family History  Problem Relation Age of Onset  . Colon cancer Brother 74       exposure to chemicals [place]  . Hypertension Mother   . Stroke Mother   . Kidney failure Father   . Breast cancer Neg Hx   . Ovarian cancer Neg Hx     ALLERGIES:  is allergic to sulfamethoxazole-trimethoprim.  MEDICATIONS:  Current Outpatient Medications  Medication Sig Dispense Refill  . apixaban (ELIQUIS) 2.5 MG TABS tablet Take 1 tablet (2.5 mg total) by mouth 2 (two) times daily. 60 tablet 5  . Cholecalciferol (VITAMIN D3) 2000 units capsule Take 2,000 Units by mouth daily.    . docusate sodium (COLACE) 100 MG capsule Take 100 mg by mouth 2 (two) times daily as needed.    . fexofenadine (ALLEGRA) 180 MG tablet Take 180 mg by mouth daily.    . fluticasone  (FLONASE) 50 MCG/ACT nasal spray Place 1 spray into both nostrils 2 (two) times a day.     . gabapentin (NEURONTIN) 100 MG capsule Take 1 capsule (100 mg total) by mouth at bedtime. 30 capsule 0  . ipratropium (ATROVENT) 0.03 % nasal spray     . lidocaine-prilocaine (EMLA) cream Apply 1 application topically as needed. 30 g 6  . Multiple Vitamins-Minerals (ONE-A-DAY WOMENS 50 PLUS PO) Take 1 tablet by mouth daily.     . ondansetron (ZOFRAN) 8 MG tablet Take 1 tablet (8 mg total) by mouth 2 (two) times daily as needed for refractory nausea / vomiting. Start on day 3 after chemotherapy. 30 tablet 1  . potassium chloride SA (K-DUR) 20 MEQ tablet Take 20 mEq by mouth daily.     . prochlorperazine (COMPAZINE) 10 MG tablet Take 1 tablet (10 mg total) by mouth every 6 (six) hours as needed (NAUSEA). 30 tablet 1  . simvastatin (ZOCOR) 40 MG tablet Take 40 mg by mouth at bedtime.     . triamterene-hydrochlorothiazide (DYAZIDE) 37.5-25 MG capsule Take 1 capsule by mouth daily.     . zinc gluconate 50 MG tablet Take 50 mg by   mouth daily.    . diclofenac sodium (VOLTAREN) 1 % GEL Apply 2 g topically 4 (four) times daily as needed (joint pain).  (Patient not taking: Reported on 01/31/2021)  11  . loperamide (IMODIUM) 2 MG capsule Take 1 capsule (2 mg total) by mouth See admin instructions. With onset of loose stool, take 4mg followed by 2mg every 2 hours,  Maximum: 16 mg/day 120 capsule 1   No current facility-administered medications for this visit.   Facility-Administered Medications Ordered in Other Visits  Medication Dose Route Frequency Provider Last Rate Last Admin  . fluorouracil (ADRUCIL) 4,500 mg in sodium chloride 0.9 % 60 mL chemo infusion  2,400 mg/m2 (Treatment Plan Recorded) Intravenous 1 day or 1 dose Yu, Zhou, MD   4,500 mg at 01/31/21 1420     PHYSICAL EXAMINATION: ECOG PERFORMANCE STATUS: 1 - Symptomatic but completely ambulatory Vitals:   01/31/21 0900  BP: (!) 111/56  Pulse: 65   Resp: 20  Temp: (!) 97 F (36.1 C)  SpO2: 100%   Filed Weights   01/31/21 0900  Weight: 170 lb 8 oz (77.3 kg)    Physical Exam Constitutional:      General: She is not in acute distress. HENT:     Head: Normocephalic and atraumatic.  Eyes:     General: No scleral icterus. Cardiovascular:     Rate and Rhythm: Normal rate and regular rhythm.     Heart sounds: Normal heart sounds.  Pulmonary:     Effort: Pulmonary effort is normal. No respiratory distress.     Breath sounds: No wheezing.  Abdominal:     General: Bowel sounds are normal. There is no distension.     Palpations: Abdomen is soft.     Comments: + Colostomy bag   Musculoskeletal:        General: No deformity. Normal range of motion.     Cervical back: Normal range of motion and neck supple.  Skin:    General: Skin is warm and dry.     Findings: No erythema or rash.  Neurological:     Mental Status: She is alert and oriented to person, place, and time. Mental status is at baseline.     Cranial Nerves: No cranial nerve deficit.     Coordination: Coordination normal.  Psychiatric:        Mood and Affect: Mood normal.    CMP Latest Ref Rng & Units 01/31/2021  Glucose 70 - 99 mg/dL 99  BUN 8 - 23 mg/dL 12  Creatinine 0.44 - 1.00 mg/dL 0.89  Sodium 135 - 145 mmol/L 138  Potassium 3.5 - 5.1 mmol/L 3.5  Chloride 98 - 111 mmol/L 102  CO2 22 - 32 mmol/L 28  Calcium 8.9 - 10.3 mg/dL 9.6  Total Protein 6.5 - 8.1 g/dL 7.0  Total Bilirubin 0.3 - 1.2 mg/dL 0.8  Alkaline Phos 38 - 126 U/L 46  AST 15 - 41 U/L 36  ALT 0 - 44 U/L 23   CBC Latest Ref Rng & Units 01/31/2021  WBC 4.0 - 10.5 K/uL 3.1(L)  Hemoglobin 12.0 - 15.0 g/dL 12.2  Hematocrit 36.0 - 46.0 % 37.4  Platelets 150 - 400 K/uL 186    LABORATORY DATA:  I have reviewed the data as listed Lab Results  Component Value Date   WBC 3.1 (L) 01/31/2021   HGB 12.2 01/31/2021   HCT 37.4 01/31/2021   MCV 91.9 01/31/2021   PLT 186 01/31/2021   Recent Labs       03/19/20 0826 06/23/20 1124 07/16/20 0802 07/23/20 1345 10/20/20 0858 01/18/21 1041 01/31/21 0834  NA 142 140 140   < > 136 141 138  K 4.1 3.7 3.2*   < > 3.6 4.2 3.5  CL 102 100 101   < > 98 101 102  CO2 _0 < > _1 GLUCOSE 95 114* 114*   < > 97 93 99  BUN _2 < > _3 CREATININE 1.01* 0.97 0.87   < > 0.85 0.82 0.89  CALCIUM 9.9 9.1 9.2   < > 9.3 9.7 9.6  GFRNONAA 56* 58* >60   < > >60 >60 >60  GFRAA >60 >60 >60  --   --   --   --   PROT 7.5 7.0 6.9   < > 6.9 7.3 7.0  ALBUMIN 4.4 4.0 3.6   < > 4.1 4.2 4.1  AST _4 < > 34 33 36  ALT _5 < > _6 ALKPHOS 37* 41 41   < > 45 49 46  BILITOT 0.8 0.4 0.5   < > 0.7 0.5 0.8   < > = values in this interval not displayed.   Iron/TIBC/Ferritin/ %Sat    Component Value Date/Time   IRON 40 06/23/2020 1124   TIBC 246 (L) 06/23/2020 1124   FERRITIN 338 (H) 06/23/2020 1124   IRONPCTSAT 16 06/23/2020 1124     RADIOGRAPHIC STUDIES: I have personally reviewed the radiological images as listed and agreed with the findings in the report. CT CHEST ABDOMEN PELVIS W CONTRAST  Result Date: 01/20/2021 CLINICAL DATA:  Restaging rectal cancer diagnosed 03/05/2019. Status post APR with vaginal recurrence. EXAM: CT CHEST, ABDOMEN, AND PELVIS WITH CONTRAST TECHNIQUE: Multidetector CT imaging of the chest, abdomen and pelvis was performed following the standard protocol during bolus administration of intravenous contrast. CONTRAST:  143m OMNIPAQUE IOHEXOL 300 MG/ML  SOLN COMPARISON:  Multiple priors including PET-CT July 20, 2020 and CT chest abdomen and pelvis October 20, 2020 FINDINGS: CT CHEST FINDINGS Cardiovascular: Right chest wall Port-A-Cath with tip in the right atrium. Atherosclerotic thoracic aortic calcifications. No thoracic aortic aneurysm. No central pulmonary embolus. Normal size heart. No significant pericardial effusion/thickening. Mediastinum/Nodes: No discrete thyroid nodule. The trachea and  esophagus are unremarkable. No pathologically enlarged mediastinal, hilar or axillary lymph nodes. Lungs/Pleura: There are multiple scattered new and/or enlarging bilateral pulmonary nodules. For instance: Increased size of the left upper lobe pulmonary nodule which now measures 4 mm on image 37/4 previously 3 mm. Increased size of a right upper lobe pulmonary nodule which now measures 3 mm on image 47/4 previously 2 mm. Increased size of the a right middle lobe pulmonary nodule which now measures 8 mm previously 6 mm. New 2 mm left upper lobe pulmonary nodule on image 21/4. New 3 mm right upper lobe pulmonary nodule on image 68/4. No pleural effusion.  No pneumothorax.  No focal consolidation. Musculoskeletal: No aggressive lytic or blastic lesions of bone. CT ABDOMEN PELVIS FINDINGS Hepatobiliary: No suspicious hepatic lesions. Gallbladder is surgically absent. No biliary ductal dilation. Pancreas: Within normal limits. Spleen: Within normal limits. Adrenals/Urinary Tract: Bilateral adrenal glands are unremarkable. No hydronephrosis. Bilateral renal cysts. No solid enhancing renal lesions. Urinary bladder is grossly unremarkable for degree of distension Stomach/Bowel: The stomach and small bowel are grossly unremarkable. The terminal ileum and appendix are both unremarkable. No acute inflammatory  process or obstructive findings. Stable surgical changes of APR and left lower quadrant colostomy. There is mild colonic wall thickening involving the distal transverse colon at the splenic flexure which appears similar to prior and may be related to under distension. Colonic diverticulosis without findings of acute diverticulitis. Vascular/Lymphatic: Aortic and branch vessel atherosclerosis. No gastrohepatic or hepatoduodenal ligament lymphadenopathy. No retroperitoneal or mesenteric lymphadenopathy. No pelvic sidewall lymphadenopathy. New mildly enlarged bilateral inguinal lymph nodes which measures 1.2 cm on the right  on image 104/3 and 1.4 cm on the left on image 102/3. Reproductive: Surgically absent Other: Persistent moderate vaginal wall thickening without discrete mass visualized. Musculoskeletal: Multilevel degenerative changes spine. Post radiation change in the pelvis and sacrum. No aggressive lytic or blastic lesion of bone. IMPRESSION: 1. Multiple scattered new and/or enlarging bilateral pulmonary nodules, consistent with pulmonary metastatic disease. 2. Persistent moderate vaginal wall thickening without discrete mass. New mildly enlarged bilateral inguinal lymph nodes, nonspecific but concerning for disease involvement. 3. Mild colonic wall thickening involving the distal transverse colon at the splenic flexure which appears similar to prior and may be related to under distension. Consider attention on follow-up imaging versus colonoscopy. 4. Stable surgical changes of APR and left lower quadrant colostomy. 5. Aortic atherosclerosis. Aortic Atherosclerosis (ICD10-I70.0). Electronically Signed   By: Dahlia Bailiff MD   On: 01/20/2021 11:23   MM 3D SCREEN BREAST BILATERAL  Result Date: 01/12/2021 CLINICAL DATA:  Screening. EXAM: DIGITAL SCREENING BILATERAL MAMMOGRAM WITH TOMOSYNTHESIS AND CAD TECHNIQUE: Bilateral screening digital craniocaudal and mediolateral oblique mammograms were obtained. Bilateral screening digital breast tomosynthesis was performed. The images were evaluated with computer-aided detection. COMPARISON:  Previous exam(s). ACR Breast Density Category b: There are scattered areas of fibroglandular density. FINDINGS: There are no findings suspicious for malignancy. The images were evaluated with computer-aided detection. IMPRESSION: No mammographic evidence of malignancy. A result letter of this screening mammogram will be mailed directly to the patient. RECOMMENDATION: Screening mammogram in one year. (Code:SM-B-01Y) BI-RADS CATEGORY  1: Negative. Electronically Signed   By: Dorise Bullion III  M.D   On: 01/12/2021 16:28    CT CHEST ABDOMEN PELVIS W CONTRAST  Result Date: 01/20/2021 CLINICAL DATA:  Restaging rectal cancer diagnosed 03/05/2019. Status post APR with vaginal recurrence. EXAM: CT CHEST, ABDOMEN, AND PELVIS WITH CONTRAST TECHNIQUE: Multidetector CT imaging of the chest, abdomen and pelvis was performed following the standard protocol during bolus administration of intravenous contrast. CONTRAST:  145m OMNIPAQUE IOHEXOL 300 MG/ML  SOLN COMPARISON:  Multiple priors including PET-CT July 20, 2020 and CT chest abdomen and pelvis October 20, 2020 FINDINGS: CT CHEST FINDINGS Cardiovascular: Right chest wall Port-A-Cath with tip in the right atrium. Atherosclerotic thoracic aortic calcifications. No thoracic aortic aneurysm. No central pulmonary embolus. Normal size heart. No significant pericardial effusion/thickening. Mediastinum/Nodes: No discrete thyroid nodule. The trachea and esophagus are unremarkable. No pathologically enlarged mediastinal, hilar or axillary lymph nodes. Lungs/Pleura: There are multiple scattered new and/or enlarging bilateral pulmonary nodules. For instance: Increased size of the left upper lobe pulmonary nodule which now measures 4 mm on image 37/4 previously 3 mm. Increased size of a right upper lobe pulmonary nodule which now measures 3 mm on image 47/4 previously 2 mm. Increased size of the a right middle lobe pulmonary nodule which now measures 8 mm previously 6 mm. New 2 mm left upper lobe pulmonary nodule on image 21/4. New 3 mm right upper lobe pulmonary nodule on image 68/4. No pleural effusion.  No pneumothorax.  No focal consolidation.  Musculoskeletal: No aggressive lytic or blastic lesions of bone. CT ABDOMEN PELVIS FINDINGS Hepatobiliary: No suspicious hepatic lesions. Gallbladder is surgically absent. No biliary ductal dilation. Pancreas: Within normal limits. Spleen: Within normal limits. Adrenals/Urinary Tract: Bilateral adrenal glands are unremarkable.  No hydronephrosis. Bilateral renal cysts. No solid enhancing renal lesions. Urinary bladder is grossly unremarkable for degree of distension Stomach/Bowel: The stomach and small bowel are grossly unremarkable. The terminal ileum and appendix are both unremarkable. No acute inflammatory process or obstructive findings. Stable surgical changes of APR and left lower quadrant colostomy. There is mild colonic wall thickening involving the distal transverse colon at the splenic flexure which appears similar to prior and may be related to under distension. Colonic diverticulosis without findings of acute diverticulitis. Vascular/Lymphatic: Aortic and branch vessel atherosclerosis. No gastrohepatic or hepatoduodenal ligament lymphadenopathy. No retroperitoneal or mesenteric lymphadenopathy. No pelvic sidewall lymphadenopathy. New mildly enlarged bilateral inguinal lymph nodes which measures 1.2 cm on the right on image 104/3 and 1.4 cm on the left on image 102/3. Reproductive: Surgically absent Other: Persistent moderate vaginal wall thickening without discrete mass visualized. Musculoskeletal: Multilevel degenerative changes spine. Post radiation change in the pelvis and sacrum. No aggressive lytic or blastic lesion of bone. IMPRESSION: 1. Multiple scattered new and/or enlarging bilateral pulmonary nodules, consistent with pulmonary metastatic disease. 2. Persistent moderate vaginal wall thickening without discrete mass. New mildly enlarged bilateral inguinal lymph nodes, nonspecific but concerning for disease involvement. 3. Mild colonic wall thickening involving the distal transverse colon at the splenic flexure which appears similar to prior and may be related to under distension. Consider attention on follow-up imaging versus colonoscopy. 4. Stable surgical changes of APR and left lower quadrant colostomy. 5. Aortic atherosclerosis. Aortic Atherosclerosis (ICD10-I70.0). Electronically Signed   By: Dahlia Bailiff MD    On: 01/20/2021 11:23   MM 3D SCREEN BREAST BILATERAL  Result Date: 01/12/2021 CLINICAL DATA:  Screening. EXAM: DIGITAL SCREENING BILATERAL MAMMOGRAM WITH TOMOSYNTHESIS AND CAD TECHNIQUE: Bilateral screening digital craniocaudal and mediolateral oblique mammograms were obtained. Bilateral screening digital breast tomosynthesis was performed. The images were evaluated with computer-aided detection. COMPARISON:  Previous exam(s). ACR Breast Density Category b: There are scattered areas of fibroglandular density. FINDINGS: There are no findings suspicious for malignancy. The images were evaluated with computer-aided detection. IMPRESSION: No mammographic evidence of malignancy. A result letter of this screening mammogram will be mailed directly to the patient. RECOMMENDATION: Screening mammogram in one year. (Code:SM-B-01Y) BI-RADS CATEGORY  1: Negative. Electronically Signed   By: Dorise Bullion III M.D   On: 01/12/2021 16:28    ASSESSMENT & PLAN:  1. Rectal cancer ([place])   2. Port-A-Cath in place   3. Encounter for antineoplastic chemotherapy   4. Goals of care, counseling/discussion   5. History of pulmonary embolism   6. History of DVT (deep vein thrombosis)   Cancer Staging Rectal cancer [facility]) Staging form: Colon and Rectum, AJCC 8th Edition - Clinical stage from 01/23/2019: Stage IIIC (cT4b, cN1a, cM0) - Signed by Earlie Server, MD on 01/23/2019 - Pathologic stage from 10/06/2019: Stage IIC (ypT4b, pN0, cM0) - Signed by Earlie Server, MD on 10/06/2019 - Pathologic: Stage Unknown (rpTX, pNX, cM1) - Signed by Earlie Server, MD on 01/31/2021  #History of stage IIIC Rectal cancer, s/p TNT, followed by 09/17/19 APR/posterior vaginectomy/TAH/BSO/VY-flap pT4b pN0 with close vaginal margin 0.2 mm.  Uterus and ovaries negative for malignancy. palliative radiation to vaginal recurrence.  Finished 09/04/2020. Recurrent metastatic rectal cancer with lung metastasis.-Goals of care, discussed.  Palliative intent. Labs are  reviewed and discussed with the patient. Again discussed with patient about the rationale and potential side effects profile of FOLFIRI plus bevacizumab.  She agrees with the plan.Proceed with treatment-Irinotecan 150 mg/m. Patient has antiemetics.  I will review her antidiarrhea medication. History of pulmonary embolism, history of bilateral DVT continue Eliquis 2.5 mg twice daily.   Follow-up 1 week for evaluation of chemotherapy tolerability and follow-up in 2 weeks for next cycle of FOLFIRI/bevacizumab.  Zhou Yu, MD, PhD  

## 2021-01-31 NOTE — Progress Notes (Signed)
[place]  [phone] [phone]   Name: Jacqueline Yoder Date: 01/31/2021 MRN: [id number]  DOB: 06-23-1946  Patient Care Team: Olin Hauser, DO as PCP - General (Family Medicine) Clent Jacks, RN as Registered Nurse Earlie Server, MD as Consulting Physician (Hematology and Oncology)    REASON FOR CONSULTATION: Jacqueline Yoder is a 75 y.o. female with multiple medical problems including stage IV colorectal cancer initially diagnosed 10/29/2018 stage IIIC disease.  Patient underwent APR/posteriorvaginectomy/TAH/BSO/VY-flap and chemoradiation.  She developed local recurrence 07/20/2020 and received additional chemoradiation.  CT of the chest, abdomen, and pelvis on 01/19/2021 revealed multiple scattered new pulmonary nodules consistent with disease progression.  Palliative care was consulted to progress goals and manage ongoing symptoms.  SOCIAL HISTORY:     reports that she quit smoking about 43 years ago. She has quit using smokeless tobacco. She reports that she does not drink alcohol and does not use drugs.  Patient is married but her husband has Lewy body dementia.  Her husband lives with a daughter and patient lives with another daughter.  They also have a son who is involved.  Patient is retired and worked as a Health and safety inspector at a [employer]:  Not on file  CODE STATUS:    PAST MEDICAL HISTORY: Past Medical History:  Diagnosis Date  . Allergy   . Arthritis   . Blood clot in vein   . Family history of colon cancer   . GERD (gastroesophageal reflux disease)   . Hypercholesteremia   . Hypertension   . Hypertension   . Lower extremity edema   . Rectal cancer ([place]) 12/2018  . Urinary incontinence     PAST SURGICAL HISTORY:  Past Surgical History:  Procedure Laterality Date  . ABDOMINAL HYSTERECTOMY    . CHOLECYSTECTOMY  1971  . COLONOSCOPY WITH PROPOFOL N/A 12/03/2018    Procedure: COLONOSCOPY WITH PROPOFOL;  Surgeon: Lucilla Lame, MD;  Location[facility] ENDOSCOPY;  Service: Endoscopy;  Laterality: N/A;  . COLONOSCOPY WITH PROPOFOL N/A 07/15/2020   Procedure: COLONOSCOPY WITH PROPOFOL;  Surgeon: [name], MD;  Location: [facility] ENDOSCOPY;  Service: Gastroenterology;  Laterality: N/A;  . FLEXIBLE SIGMOIDOSCOPY N/A 12/06/2018   Procedure: FLEXIBLE SIGMOIDOSCOPY;  Surgeon: [name], MD;  Location: [facility] ENDOSCOPY;  Service: Endoscopy;  Laterality: N/A;  . LAPAROSCOPIC COLOSTOMY  01/06/2019  . PORTACATH PLACEMENT N/A 04/03/2019   Procedure: INSERTION PORT-A-CATH;  Surgeon: Jules Husbands, MD;  Location: [facility] ORS;  Service: General;  Laterality: N/A;    HEMATOLOGY/ONCOLOGY HISTORY:  Oncology History  Rectal cancer ([place])  01/23/2019 Initial Diagnosis   Rectal cancer ([place])   01/23/2019 Cancer Staging   Staging form: Colon and Rectum, AJCC 8th Edition - Clinical stage from 01/23/2019: Stage IIIC (cT4b, cN1a, cM0) - Signed by Earlie Server, MD on 01/23/2019   02/04/2019 - 02/04/2019 Chemotherapy   The patient had capecitabine (XELODA) 150 MG tablet, 150 mg (100 % of original dose 150 mg), Oral, 2 times daily after meals, 1 of 1 cycle, Start date: 01/27/2019, End date: 04/03/2019 Dose modification: 150 mg (original dose 150 mg, Cycle 1) capecitabine (XELODA) 500 MG tablet, 1,500 mg (100 % of original dose 1,500 mg), Oral, 2 times daily after meals, 1 of 1 cycle, Start date: 01/27/2019, End date: 04/03/2019 Dose modification: 1,500 mg (original dose 1,500 mg, Cycle 1)  for chemotherapy treatment.    04/09/2019 - 07/18/2019 Chemotherapy   The patient had palonosetron (  ALOXI) injection 0.25 mg, 0.25 mg, Intravenous,  Once, 8 of 8 cycles Administration: 0.25 mg (04/09/2019), 0.25 mg (04/23/2019), 0.25 mg (05/07/2019), 0.25 mg (05/21/2019), 0.25 mg (06/04/2019), 0.25 mg (06/18/2019), 0.25 mg (07/02/2019), 0.25 mg (07/16/2019) leucovorin 850 mg in dextrose 5 % 250 mL infusion, 844 mg, Intravenous,  Once,  8 of 8 cycles Administration: 850 mg (04/09/2019), 850 mg (04/23/2019), 850 mg (05/07/2019), 850 mg (05/21/2019), 800 mg (06/18/2019), 800 mg (07/02/2019), 800 mg (07/16/2019) oxaliplatin (ELOXATIN) 150 mg in dextrose 5 % 500 mL chemo infusion, 71 mg/m2 = 160 mg (100 % of original dose 75 mg/m2), Intravenous,  Once, 8 of 8 cycles Dose modification: 75 mg/m2 (original dose 75 mg/m2, Cycle 1, Reason: Provider Judgment) Administration: 150 mg (04/09/2019), 150 mg (04/23/2019), 150 mg (05/07/2019), 150 mg (05/21/2019), 150 mg (06/04/2019), 150 mg (06/18/2019), 150 mg (07/02/2019), 150 mg (07/16/2019) fluorouracil (ADRUCIL) 5,000 mg in sodium chloride 0.9 % 150 mL chemo infusion, 2,370 mg/m2 = 5,050 mg, Intravenous, 1 Day/Dose, 8 of 8 cycles Administration: 5,000 mg (04/09/2019), 5,000 mg (04/23/2019), 5,000 mg (05/07/2019), 5,000 mg (05/21/2019), 5,000 mg (06/04/2019), 5,000 mg (06/18/2019), 5,000 mg (07/02/2019), 5,000 mg (07/16/2019)  for chemotherapy treatment.    10/06/2019 Cancer Staging   Staging form: Colon and Rectum, AJCC 8th Edition - Pathologic stage from 10/06/2019: Stage IIC (ypT4b, pN0, cM0) - Signed by Earlie Server, MD on 10/06/2019   07/16/2020 - 07/19/2020 Chemotherapy         01/31/2021 -  Chemotherapy    Patient is on Treatment Plan: COLORECTAL FOLFIRI / BEVACIZUMAB Q14D      Metastatic adenocarcinoma ([place])  09/01/2019 Initial Diagnosis   Metastatic adenocarcinoma ([place])   01/31/2021 -  Chemotherapy    Patient is on Treatment Plan: COLORECTAL FOLFIRI / BEVACIZUMAB Q14D        ALLERGIES:  is allergic to sulfamethoxazole-trimethoprim.  MEDICATIONS:  Current Outpatient Medications  Medication Sig Dispense Refill  . apixaban (ELIQUIS) 2.5 MG TABS tablet Take 1 tablet (2.5 mg total) by mouth 2 (two) times daily. 60 tablet 5  . Cholecalciferol (VITAMIN D3) 2000 units capsule Take 2,000 Units by mouth daily.    . diclofenac sodium (VOLTAREN) 1 % GEL Apply 2 g topically 4 (four) times daily as needed (joint  pain).  (Patient not taking: Reported on 01/31/2021)  11  . docusate sodium (COLACE) 100 MG capsule Take 100 mg by mouth 2 (two) times daily as needed.    . fexofenadine (ALLEGRA) 180 MG tablet Take 180 mg by mouth daily.    . fluticasone (FLONASE) 50 MCG/ACT nasal spray Place 1 spray into both nostrils 2 (two) times a day.     . gabapentin (NEURONTIN) 100 MG capsule Take 1 capsule (100 mg total) by mouth at bedtime. 30 capsule 0  . ipratropium (ATROVENT) 0.03 % nasal spray     . lidocaine-prilocaine (EMLA) cream Apply 1 application topically as needed. 30 g 6  . loperamide (IMODIUM) 2 MG capsule Take 1 capsule (2 mg total) by mouth See admin instructions. With onset of loose stool, take 62m followed by 266mevery 2 hours,  Maximum: 16 mg/day 120 capsule 1  . Multiple Vitamins-Minerals (ONE-A-DAY WOMENS 50 PLUS PO) Take 1 tablet by mouth daily.     . ondansetron (ZOFRAN) 8 MG tablet Take 1 tablet (8 mg total) by mouth 2 (two) times daily as needed for refractory nausea / vomiting. Start on day 3 after chemotherapy. 30 tablet 1  . potassium chloride SA (K-DUR) 20 MEQ tablet  Take 20 mEq by mouth daily.     . prochlorperazine (COMPAZINE) 10 MG tablet Take 1 tablet (10 mg total) by mouth every 6 (six) hours as needed (NAUSEA). 30 tablet 1  . simvastatin (ZOCOR) 40 MG tablet Take 40 mg by mouth at bedtime.     . triamterene-hydrochlorothiazide (DYAZIDE) 37.5-25 MG capsule Take 1 capsule by mouth daily.     Marland Kitchen zinc gluconate 50 MG tablet Take 50 mg by mouth daily.     No current facility-administered medications for this visit.   Facility-Administered Medications Ordered in Other Visits  Medication Dose Route Frequency Provider Last Rate Last Admin  . fluorouracil (ADRUCIL) 4,500 mg in sodium chloride 0.9 % 60 mL chemo infusion  2,400 mg/m2 (Treatment Plan Recorded) Intravenous 1 day or 1 dose Earlie Server, MD      . fluorouracil (ADRUCIL) chemo injection 750 mg  400 mg/m2 (Treatment Plan Recorded)  Intravenous Once Earlie Server, MD      . irinotecan (CAMPTOSAR) 280 mg in sodium chloride 0.9 % 500 mL chemo infusion  150 mg/m2 (Treatment Plan Recorded) Intravenous Once Earlie Server, MD      . leucovorin 750 mg in sodium chloride 0.9 % 250 mL infusion  750 mg Intravenous Once Earlie Server, MD        VITAL SIGNS: There were no vitals taken for this visit. There were no vitals filed for this visit.  Estimated body mass index is 28.37 kg/m as calculated from the following:   Height as of 12/15/20: '5\' 5"'  (1.651 m).   Weight as of an earlier encounter on 01/31/21: 170 lb 8 oz (77.3 kg).  LABS: CBC:    Component Value Date/Time   WBC 3.1 (L) 01/31/2021 0834   HGB 12.2 01/31/2021 0834   HCT 37.4 01/31/2021 0834   PLT 186 01/31/2021 0834   MCV 91.9 01/31/2021 0834   NEUTROABS 1.4 (L) 01/31/2021 0834   LYMPHSABS 1.2 01/31/2021 0834   MONOABS 0.4 01/31/2021 0834   EOSABS 0.1 01/31/2021 0834   BASOSABS 0.0 01/31/2021 0834   Comprehensive Metabolic Panel:    Component Value Date/Time   NA 138 01/31/2021 0834   K 3.5 01/31/2021 0834   CL 102 01/31/2021 0834   CO2 28 01/31/2021 0834   BUN 12 01/31/2021 0834   CREATININE 0.89 01/31/2021 0834   GLUCOSE 99 01/31/2021 0834   CALCIUM 9.6 01/31/2021 0834   AST 36 01/31/2021 0834   ALT 23 01/31/2021 0834   ALKPHOS 46 01/31/2021 0834   BILITOT 0.8 01/31/2021 0834   PROT 7.0 01/31/2021 0834   ALBUMIN 4.1 01/31/2021 0834    RADIOGRAPHIC STUDIES: CT CHEST ABDOMEN PELVIS W CONTRAST  Result Date: 01/20/2021 CLINICAL DATA:  Restaging rectal cancer diagnosed 03/05/2019. Status post APR with vaginal recurrence. EXAM: CT CHEST, ABDOMEN, AND PELVIS WITH CONTRAST TECHNIQUE: Multidetector CT imaging of the chest, abdomen and pelvis was performed following the standard protocol during bolus administration of intravenous contrast. CONTRAST:  125m OMNIPAQUE IOHEXOL 300 MG/ML  SOLN COMPARISON:  Multiple priors including PET-CT July 20, 2020 and CT chest abdomen  and pelvis October 20, 2020 FINDINGS: CT CHEST FINDINGS Cardiovascular: Right chest wall Port-A-Cath with tip in the right atrium. Atherosclerotic thoracic aortic calcifications. No thoracic aortic aneurysm. No central pulmonary embolus. Normal size heart. No significant pericardial effusion/thickening. Mediastinum/Nodes: No discrete thyroid nodule. The trachea and esophagus are unremarkable. No pathologically enlarged mediastinal, hilar or axillary lymph nodes. Lungs/Pleura: There are multiple scattered new and/or enlarging bilateral pulmonary nodules.  For instance: Increased size of the left upper lobe pulmonary nodule which now measures 4 mm on image 37/4 previously 3 mm. Increased size of a right upper lobe pulmonary nodule which now measures 3 mm on image 47/4 previously 2 mm. Increased size of the a right middle lobe pulmonary nodule which now measures 8 mm previously 6 mm. New 2 mm left upper lobe pulmonary nodule on image 21/4. New 3 mm right upper lobe pulmonary nodule on image 68/4. No pleural effusion.  No pneumothorax.  No focal consolidation. Musculoskeletal: No aggressive lytic or blastic lesions of bone. CT ABDOMEN PELVIS FINDINGS Hepatobiliary: No suspicious hepatic lesions. Gallbladder is surgically absent. No biliary ductal dilation. Pancreas: Within normal limits. Spleen: Within normal limits. Adrenals/Urinary Tract: Bilateral adrenal glands are unremarkable. No hydronephrosis. Bilateral renal cysts. No solid enhancing renal lesions. Urinary bladder is grossly unremarkable for degree of distension Stomach/Bowel: The stomach and small bowel are grossly unremarkable. The terminal ileum and appendix are both unremarkable. No acute inflammatory process or obstructive findings. Stable surgical changes of APR and left lower quadrant colostomy. There is mild colonic wall thickening involving the distal transverse colon at the splenic flexure which appears similar to prior and may be related to under  distension. Colonic diverticulosis without findings of acute diverticulitis. Vascular/Lymphatic: Aortic and branch vessel atherosclerosis. No gastrohepatic or hepatoduodenal ligament lymphadenopathy. No retroperitoneal or mesenteric lymphadenopathy. No pelvic sidewall lymphadenopathy. New mildly enlarged bilateral inguinal lymph nodes which measures 1.2 cm on the right on image 104/3 and 1.4 cm on the left on image 102/3. Reproductive: Surgically absent Other: Persistent moderate vaginal wall thickening without discrete mass visualized. Musculoskeletal: Multilevel degenerative changes spine. Post radiation change in the pelvis and sacrum. No aggressive lytic or blastic lesion of bone. IMPRESSION: 1. Multiple scattered new and/or enlarging bilateral pulmonary nodules, consistent with pulmonary metastatic disease. 2. Persistent moderate vaginal wall thickening without discrete mass. New mildly enlarged bilateral inguinal lymph nodes, nonspecific but concerning for disease involvement. 3. Mild colonic wall thickening involving the distal transverse colon at the splenic flexure which appears similar to prior and may be related to under distension. Consider attention on follow-up imaging versus colonoscopy. 4. Stable surgical changes of APR and left lower quadrant colostomy. 5. Aortic atherosclerosis. Aortic Atherosclerosis (ICD10-I70.0). Electronically Signed   By: Dahlia Bailiff MD   On: 01/20/2021 11:23   MM 3D SCREEN BREAST BILATERAL  Result Date: 01/12/2021 CLINICAL DATA:  Screening. EXAM: DIGITAL SCREENING BILATERAL MAMMOGRAM WITH TOMOSYNTHESIS AND CAD TECHNIQUE: Bilateral screening digital craniocaudal and mediolateral oblique mammograms were obtained. Bilateral screening digital breast tomosynthesis was performed. The images were evaluated with computer-aided detection. COMPARISON:  Previous exam(s). ACR Breast Density Category b: There are scattered areas of fibroglandular density. FINDINGS: There are no  findings suspicious for malignancy. The images were evaluated with computer-aided detection. IMPRESSION: No mammographic evidence of malignancy. A result letter of this screening mammogram will be mailed directly to the patient. RECOMMENDATION: Screening mammogram in one year. (Code:SM-B-01Y) BI-RADS CATEGORY  1: Negative. Electronically Signed   By: Dorise Bullion III M.D   On: 01/12/2021 16:28    PERFORMANCE STATUS (ECOG) : 1 - Symptomatic but completely ambulatory  Review of Systems Unless otherwise noted, a complete review of systems is negative.  Physical Exam General: NAD Pulmonary: Unlabored Extremities: no edema, no joint deformities Skin: no rashes Neurological: Weakness but otherwise nonfocal  IMPRESSION: I met with patient in the infusion area.  I introduced palliative care services and attempted to establish therapeutic  rapport.  Patient seems to have a reasonable understanding of her current cancer status.  She says she is tolerated chemotherapy well in the past and her goals are aligned with the current scope of treatment.  Patient says that she has strong faith in God and plans to remain positive that she will have a good outcome.  She feels optimistic that she can survive this cancer.  She has been informed that cancer treatments are with palliative intent.  Symptomatically, she is doing well.  She denies any distressing symptoms.  At baseline, she is functionally independent with her own care at home.  She no longer drives due to cataracts.  She lives at home with her daughter.  Her husband has dementia and lives with another daughter.  However, patient reports having good family support.  PLAN: -Continue current scope of treatment -Patient will benefit from future conversation regarding ACP -Follow-up MyChart visit 1 to 2 months   Patient expressed understanding and was in agreement with this plan. She also understands that She can call the clinic at any time with any  questions, concerns, or complaints.     Time Total: 30 minutes  Visit consisted of counseling and education dealing with the complex and emotionally intense issues of symptom management and palliative care in the setting of serious and potentially life-threatening illness.Greater than 50%  of this time was spent counseling and coordinating care related to the above assessment and plan.  Signed by: Altha Harm, PhD, NP-C

## 2021-02-01 LAB — CEA: CEA: 2297 ng/mL — ABNORMAL HIGH (ref 0.0–4.7)

## 2021-02-02 ENCOUNTER — Other Ambulatory Visit: Payer: Self-pay

## 2021-02-02 ENCOUNTER — Inpatient Hospital Stay: Payer: Medicare Other

## 2021-02-02 DIAGNOSIS — R609 Edema, unspecified: Secondary | ICD-10-CM | POA: Diagnosis not present

## 2021-02-02 DIAGNOSIS — N281 Cyst of kidney, acquired: Secondary | ICD-10-CM | POA: Diagnosis not present

## 2021-02-02 DIAGNOSIS — I10 Essential (primary) hypertension: Secondary | ICD-10-CM | POA: Diagnosis not present

## 2021-02-02 DIAGNOSIS — C799 Secondary malignant neoplasm of unspecified site: Secondary | ICD-10-CM

## 2021-02-02 DIAGNOSIS — K219 Gastro-esophageal reflux disease without esophagitis: Secondary | ICD-10-CM | POA: Diagnosis not present

## 2021-02-02 DIAGNOSIS — Z800 Family history of malignant neoplasm of digestive organs: Secondary | ICD-10-CM | POA: Diagnosis not present

## 2021-02-02 DIAGNOSIS — Z86718 Personal history of other venous thrombosis and embolism: Secondary | ICD-10-CM | POA: Diagnosis not present

## 2021-02-02 DIAGNOSIS — C20 Malignant neoplasm of rectum: Secondary | ICD-10-CM | POA: Diagnosis not present

## 2021-02-02 DIAGNOSIS — Z85048 Personal history of other malignant neoplasm of rectum, rectosigmoid junction, and anus: Secondary | ICD-10-CM | POA: Diagnosis not present

## 2021-02-02 DIAGNOSIS — Z87891 Personal history of nicotine dependence: Secondary | ICD-10-CM | POA: Diagnosis not present

## 2021-02-02 DIAGNOSIS — E7800 Pure hypercholesterolemia, unspecified: Secondary | ICD-10-CM | POA: Diagnosis not present

## 2021-02-02 DIAGNOSIS — I700 Atherosclerosis of aorta: Secondary | ICD-10-CM | POA: Diagnosis not present

## 2021-02-02 DIAGNOSIS — Z79899 Other long term (current) drug therapy: Secondary | ICD-10-CM | POA: Diagnosis not present

## 2021-02-02 DIAGNOSIS — R350 Frequency of micturition: Secondary | ICD-10-CM | POA: Diagnosis not present

## 2021-02-02 DIAGNOSIS — Z86711 Personal history of pulmonary embolism: Secondary | ICD-10-CM | POA: Diagnosis not present

## 2021-02-02 DIAGNOSIS — Z515 Encounter for palliative care: Secondary | ICD-10-CM | POA: Diagnosis not present

## 2021-02-02 DIAGNOSIS — K521 Toxic gastroenteritis and colitis: Secondary | ICD-10-CM | POA: Diagnosis not present

## 2021-02-02 DIAGNOSIS — Z5111 Encounter for antineoplastic chemotherapy: Secondary | ICD-10-CM | POA: Diagnosis not present

## 2021-02-02 MED ORDER — HEPARIN SOD (PORK) LOCK FLUSH 100 UNIT/ML IV SOLN
INTRAVENOUS | Status: AC
  Filled 2021-02-02: qty 5

## 2021-02-02 MED ORDER — SODIUM CHLORIDE 0.9% FLUSH
10.0000 mL | INTRAVENOUS | Status: DC | PRN
  Administered 2021-02-02: 10 mL
  Filled 2021-02-02: qty 10

## 2021-02-02 MED ORDER — HEPARIN SOD (PORK) LOCK FLUSH 100 UNIT/ML IV SOLN
500.0000 [IU] | Freq: Once | INTRAVENOUS | Status: AC | PRN
  Administered 2021-02-02: 500 [IU]
  Filled 2021-02-02: qty 5

## 2021-02-04 ENCOUNTER — Encounter: Payer: Self-pay | Admitting: Oncology

## 2021-02-07 ENCOUNTER — Inpatient Hospital Stay: Payer: Medicare Other

## 2021-02-07 ENCOUNTER — Encounter: Payer: Self-pay | Admitting: Oncology

## 2021-02-07 ENCOUNTER — Inpatient Hospital Stay (HOSPITAL_BASED_OUTPATIENT_CLINIC_OR_DEPARTMENT_OTHER): Payer: Medicare Other | Admitting: Oncology

## 2021-02-07 VITALS — BP 136/58 | HR 68 | Temp 97.0°F | Wt 172.9 lb

## 2021-02-07 VITALS — BP 136/58 | HR 97 | Temp 97.0°F | Wt 172.8 lb

## 2021-02-07 DIAGNOSIS — Z86711 Personal history of pulmonary embolism: Secondary | ICD-10-CM | POA: Diagnosis not present

## 2021-02-07 DIAGNOSIS — N281 Cyst of kidney, acquired: Secondary | ICD-10-CM | POA: Diagnosis not present

## 2021-02-07 DIAGNOSIS — R350 Frequency of micturition: Secondary | ICD-10-CM

## 2021-02-07 DIAGNOSIS — T451X5A Adverse effect of antineoplastic and immunosuppressive drugs, initial encounter: Secondary | ICD-10-CM | POA: Diagnosis not present

## 2021-02-07 DIAGNOSIS — C20 Malignant neoplasm of rectum: Secondary | ICD-10-CM

## 2021-02-07 DIAGNOSIS — R609 Edema, unspecified: Secondary | ICD-10-CM | POA: Diagnosis not present

## 2021-02-07 DIAGNOSIS — I700 Atherosclerosis of aorta: Secondary | ICD-10-CM | POA: Diagnosis not present

## 2021-02-07 DIAGNOSIS — K521 Toxic gastroenteritis and colitis: Secondary | ICD-10-CM | POA: Diagnosis not present

## 2021-02-07 DIAGNOSIS — Z515 Encounter for palliative care: Secondary | ICD-10-CM | POA: Diagnosis not present

## 2021-02-07 DIAGNOSIS — I10 Essential (primary) hypertension: Secondary | ICD-10-CM | POA: Diagnosis not present

## 2021-02-07 DIAGNOSIS — E860 Dehydration: Secondary | ICD-10-CM

## 2021-02-07 DIAGNOSIS — K219 Gastro-esophageal reflux disease without esophagitis: Secondary | ICD-10-CM | POA: Diagnosis not present

## 2021-02-07 DIAGNOSIS — C799 Secondary malignant neoplasm of unspecified site: Secondary | ICD-10-CM

## 2021-02-07 DIAGNOSIS — E7800 Pure hypercholesterolemia, unspecified: Secondary | ICD-10-CM | POA: Diagnosis not present

## 2021-02-07 DIAGNOSIS — Z87891 Personal history of nicotine dependence: Secondary | ICD-10-CM | POA: Diagnosis not present

## 2021-02-07 DIAGNOSIS — Z79899 Other long term (current) drug therapy: Secondary | ICD-10-CM | POA: Diagnosis not present

## 2021-02-07 DIAGNOSIS — Z86718 Personal history of other venous thrombosis and embolism: Secondary | ICD-10-CM | POA: Diagnosis not present

## 2021-02-07 DIAGNOSIS — Z85048 Personal history of other malignant neoplasm of rectum, rectosigmoid junction, and anus: Secondary | ICD-10-CM | POA: Diagnosis not present

## 2021-02-07 DIAGNOSIS — R197 Diarrhea, unspecified: Secondary | ICD-10-CM

## 2021-02-07 DIAGNOSIS — Z5111 Encounter for antineoplastic chemotherapy: Secondary | ICD-10-CM | POA: Diagnosis not present

## 2021-02-07 DIAGNOSIS — Z800 Family history of malignant neoplasm of digestive organs: Secondary | ICD-10-CM | POA: Diagnosis not present

## 2021-02-07 LAB — CBC WITH DIFFERENTIAL/PLATELET
Abs Immature Granulocytes: 0.02 10*3/uL (ref 0.00–0.07)
Basophils Absolute: 0 10*3/uL (ref 0.0–0.1)
Basophils Relative: 1 %
Eosinophils Absolute: 0.1 10*3/uL (ref 0.0–0.5)
Eosinophils Relative: 3 %
HCT: 34.7 % — ABNORMAL LOW (ref 36.0–46.0)
Hemoglobin: 11.2 g/dL — ABNORMAL LOW (ref 12.0–15.0)
Immature Granulocytes: 1 %
Lymphocytes Relative: 31 %
Lymphs Abs: 1 10*3/uL (ref 0.7–4.0)
MCH: 29.6 pg (ref 26.0–34.0)
MCHC: 32.3 g/dL (ref 30.0–36.0)
MCV: 91.8 fL (ref 80.0–100.0)
Monocytes Absolute: 0.2 10*3/uL (ref 0.1–1.0)
Monocytes Relative: 5 %
Neutro Abs: 2 10*3/uL (ref 1.7–7.7)
Neutrophils Relative %: 59 %
Platelets: 155 10*3/uL (ref 150–400)
RBC: 3.78 MIL/uL — ABNORMAL LOW (ref 3.87–5.11)
RDW: 13.2 % (ref 11.5–15.5)
WBC: 3.4 10*3/uL — ABNORMAL LOW (ref 4.0–10.5)
nRBC: 0 % (ref 0.0–0.2)

## 2021-02-07 LAB — URINALYSIS, COMPLETE (UACMP) WITH MICROSCOPIC
Bacteria, UA: NONE SEEN
Bilirubin Urine: NEGATIVE
Glucose, UA: NEGATIVE mg/dL
Hgb urine dipstick: NEGATIVE
Ketones, ur: NEGATIVE mg/dL
Leukocytes,Ua: NEGATIVE
Nitrite: NEGATIVE
Protein, ur: NEGATIVE mg/dL
Specific Gravity, Urine: 1.002 — ABNORMAL LOW (ref 1.005–1.030)
Squamous Epithelial / HPF: NONE SEEN (ref 0–5)
pH: 9 — ABNORMAL HIGH (ref 5.0–8.0)

## 2021-02-07 LAB — COMPREHENSIVE METABOLIC PANEL
ALT: 27 U/L (ref 0–44)
AST: 45 U/L — ABNORMAL HIGH (ref 15–41)
Albumin: 3.9 g/dL (ref 3.5–5.0)
Alkaline Phosphatase: 48 U/L (ref 38–126)
Anion gap: 11 (ref 5–15)
BUN: 9 mg/dL (ref 8–23)
CO2: 28 mmol/L (ref 22–32)
Calcium: 9 mg/dL (ref 8.9–10.3)
Chloride: 100 mmol/L (ref 98–111)
Creatinine, Ser: 0.97 mg/dL (ref 0.44–1.00)
GFR, Estimated: >60 mL/min (ref >60)
Glucose, Bld: 101 mg/dL — ABNORMAL HIGH (ref 70–99)
Potassium: 3.6 mmol/L (ref 3.5–5.1)
Sodium: 139 mmol/L (ref 135–145)
Total Bilirubin: 0.5 mg/dL (ref 0.3–1.2)
Total Protein: 6.8 g/dL (ref 6.5–8.1)

## 2021-02-07 MED ORDER — SODIUM CHLORIDE 0.9% FLUSH
10.0000 mL | Freq: Once | INTRAVENOUS | Status: AC | PRN
  Administered 2021-02-07: 10 mL
  Filled 2021-02-07: qty 10

## 2021-02-07 MED ORDER — HEPARIN SOD (PORK) LOCK FLUSH 100 UNIT/ML IV SOLN
500.0000 [IU] | Freq: Once | INTRAVENOUS | Status: AC | PRN
  Administered 2021-02-07: 500 [IU]
  Filled 2021-02-07: qty 5

## 2021-02-07 MED ORDER — SODIUM CHLORIDE 0.9 % IV SOLN
Freq: Once | INTRAVENOUS | Status: AC
  Filled 2021-02-07: qty 250

## 2021-02-07 NOTE — Progress Notes (Signed)
Had a little diarrhea over the weekend. She took imodium with relief. Not drinking as much as she should, does drink pedialyte and power aid. Stats she does feel alittle dehydrated. Ambulatory. Eating food.

## 2021-02-07 NOTE — Progress Notes (Signed)
Pt received 1 Liter of NS for hydration.Provided a urine specimen prior to discharge. Pt ambulatory and stable at discharge.

## 2021-02-07 NOTE — Progress Notes (Signed)
Hematology/Oncology progress note [facility] [phone] [phone]   Patient Care Team: Karamalegos, Alexander J, DO as PCP - General (Family Medicine) Stanton, Kristi D, RN as Registered Nurse Yu, Zhou, MD as Consulting Physician (Hematology and Oncology)  REFERRING PROVIDER: Dr.Secord CHIEF COMPLAINTS/REASON FOR VISIT:  Follow up for rectal cancer  HISTORY OF PRESENTING ILLNESS:  Jacqueline Yoder is a  74 y.o.  female with PMH listed below who was referred to me for evaluation of endometrial cancer.   Patient initially presented with complaints of postmenopausal bleeding on 08/16/2018.  History of was menopausal vaginal bleeding in 2016 which resulted in cervical polypectomy.  Pathology 02/04/2015 showed cervical polyp, consistent with benign endometrial polyp.  [name] after polypectomy due to anxiety associated with pelvic exams.  pelvic exam on 08/16/2018 reviewed cervical abnormality and from enlarged uterus. Seen by Dr. Cherry on 10/29/2018.  Endometrial biopsy and a Pap smear was performed. 10/29/2018 Pap smear showed adenocarcinoma, favor endometrial origin. 10/29/2018 endometrial biopsy showed endometrioid carcinoma, FIGO grade 1.  10/29/2018- TA & TV Ultrasound revealed: Anteverted uterus measuring 8.7 x 5.6 x 6.4 cm without evidence of focal masses.  The endometrium measuring 24.1 mm (thickened) and heterogeneous.  Right and left ovaries not visualized.  No adnexal masses identified.  No free fluid in cul-de-sac.  Patient was seen by Dr. Secord in clinic on 11/13/2018.  Cervical exam reveals 2 cm exophytic irregular mass consistent with malignancy.   11/19/2018 CT chest abdomen pelvis with contrast showed thickened endometrium with some irregularity compatible with the provided diagnosis of endometrial malignancy.  There is a mildly prominent left inguinal node 1.4 cm.  Patient was seen by Dr. Berchuck on 11/20/2018 and left groin  lymph node biopsy was recommended.  11/26/2018 patient underwent left inguinal lymph node biopsy. Pathology showed metastatic adenocarcinoma consistent with colorectal origin.  CDX 2+.  Case was discussed on tumor board.  Recommend colonoscopy for further evaluation.  Patient reports significant weight loss 30 pounds over the last year.  Chronic vaginal spotting. Change of bowel habits the past few months.  More constipated.  Family history positive for brother who has colon cancer prostate cancer.  patient has underwent colonoscopy on 12/03/2018 which reviewed a nonobstructing large mass in the rectum.  Also chronic fistula.  Mass was not circumferential.  This was biopsied with a cold forceps for histology.  Pathology came back hyperplastic polyp negative for dysplasia and malignancy. [name], patient underwent flex sigmoidoscopy on 12/06/2018 with rebiopsy of the rectal mass. This time biopsy results came back positive for invasive colorectal adenocarcinoma, moderately differentiated. Immunotherapy for nearly mismatch repair protein (MMR ) was performed.  There is no loss of MMR expression.  low probability of MSI high.   # Seen by [name] for evaluation of resectability for rectal cancer. In addition, she also had a second opinion with [facility] where her endometrial biopsy pathology was changed to  adenocarcinoma, consistent with colorectal primary.   [name]. She has home health that has been assisting with ostomy care  Patient was also evaluated by [facility] oncology.  Recommendation is to proceed with TNT with concurrent chemoradiation followed by neoadjuvant chemotherapy followed by surgical resection. Patient prefers to have treatment done locally with [facility].   # Oncology Treatment:  02/03/2019- 03/19/2019  concurrent Xeloda and radiation.  Xeloda dose 825mg /m2 BID - rounded to 1650mg BID- on days of  radiation. 04/09/2019, started   on FOLFOX with bolus 5-FU omitted.  07/16/2019 finished 8 cycles of FOLFOX.  # Patient has underwent 09/17/19 APR/posterior vaginectomy/TAH/BSO/VY-flap pT4b pN0 with close vaginal margin 0.2 mm.  Uterus and ovaries negative for malignancy. Patient reports bilateral lower extremity numbness and tingling, intermittent, left worse than right. She has lost a lot of weight since her APR surgery.   Surveillance plan Recommend history and physical/tumor marker monitoring every 3 to 6 months for the first 2 years after surgery and then every 6 months for a total of 5 years. Chest abdomen pelvis every 6 to 12 months for total 5 years. Colonoscopy 1 year after surgery, if no advanced adenoma, repeat in 3 years and then 5 years.  #Family history with half brother having's history of colon cancer prostate cancer.  Personal history of colorectal cancer.  Patient has not decided if she wants genetic testing.   # vaginal introitus mass biopsied. Pathology is consistent with metastatic colorectal adenocarcinoma  # history of PE( 01/13/2020)  in the bilateral lower extremity DVT (01/13/2020).   She finishes 6 months of anticoagulation with Eliquis 5 mg twice daily. Now switched to Eliquis 2.5 mg twice daily..  # She has now developed recurrent disease. I have discussed with [name]  Dr. Hester Mates and the mass is not resectable. Patient has also had colonoscopy by Dr. Vicente Males yesterday. Normal examination. # 07/16/2020 cycle 1 FOLFIRI  # 07/20/2020 PET scan was done for further evaluation, images are consistent with local recurrence, no distant metastasis. #Discussed with radiation oncology Dr. Baruch Gouty will recommends concurrent chemotherapy and radiation. 08/02/2020-08/16/2020, patient starts radiation.  Xeloda was held due to neutropenia 08/17/2020,-09/06/2020 Xeloda 1500 mg twice daily concurrently with radiation INTERVAL HISTORY Jacqueline Yoder is a 75 y.o. female who has above  history reviewed by me presents for follow-up of rectal cancer. #Patient is status post cycle 1 systemic chemotherapy with FOLFIRI/bevacizumab.  Tolerates well. She noticed loose stool episodes of 3 days ago and it started using Imodium as instructed 2 days ago.  Imodium helped her symptoms.  Diarrhea has stopped.  Denies any fever, chills, nausea vomiting diarrhea. There was some concern about her being dehydrated 3 days ago and she tried increasing oral hydration with Pedialyte/Gatorade.  She feels better today.  Frequent urination.  Review of Systems  Constitutional: Negative for appetite change, chills, fatigue, fever and unexpected weight change.  HENT:   Negative for hearing loss and voice change.   Eyes: Negative for eye problems.  Respiratory: Negative for chest tightness and cough.   Cardiovascular: Negative for chest pain.  Gastrointestinal: Negative for abdominal distention, abdominal pain, blood in stool, constipation, diarrhea and nausea.  Endocrine: Negative for hot flashes.  Genitourinary: Negative for difficulty urinating and frequency.   Musculoskeletal: Negative for arthralgias.  Skin: Negative for itching and rash.  Neurological: Negative for extremity weakness and numbness.  Hematological: Negative for adenopathy.  Psychiatric/Behavioral: Negative for confusion.    MEDICAL HISTORY:  Past Medical History:  Diagnosis Date  . Allergy   . Arthritis   . Blood clot in vein   . Family history of colon cancer   . GERD (gastroesophageal reflux disease)   . Hypercholesteremia   . Hypertension   . Hypertension   . Lower extremity edema   . Rectal cancer ([place]) 12/2018  . Urinary incontinence     SURGICAL HISTORY: Past Surgical History:  Procedure Laterality Date  . ABDOMINAL HYSTERECTOMY    . CHOLECYSTECTOMY  1971  . COLONOSCOPY WITH PROPOFOL  N/A 12/03/2018   Procedure: COLONOSCOPY WITH PROPOFOL;  Surgeon: Lucilla Lame, MD;  Location: [facility] ENDOSCOPY;  Service:  Endoscopy;  Laterality: N/A;  . COLONOSCOPY WITH PROPOFOL N/A 07/15/2020   Procedure: COLONOSCOPY WITH PROPOFOL;  Surgeon: [name], MD;  Location[facility] ENDOSCOPY;  Service: Gastroenterology;  Laterality: N/A;  . FLEXIBLE SIGMOIDOSCOPY N/A 12/06/2018   Procedure: FLEXIBLE SIGMOIDOSCOPY;  Surgeon: [name], MD;  Location: [facility] [place] ENDOSCOPY;  Service: Endoscopy;  Laterality: N/A;  . LAPAROSCOPIC COLOSTOMY  01/06/2019  . PORTACATH PLACEMENT N/A 04/03/2019   Procedure: INSERTION PORT-A-CATH;  Surgeon: Jules Husbands, MD;  Location: [facility] ORS;  Service: General;  Laterality: N/A;    SOCIAL HISTORY: Social History   Socioeconomic History  . Marital status: Married    Spouse name: Not on file  . Number of children: Not on file  . Years of education: Not on file  . Highest education level: Not on file  Occupational History  . Not on file  Tobacco Use  . Smoking status: Former Smoker    Quit date: 12/02/1977    Years since quitting: 43.2  . Smokeless tobacco: Former Network engineer  . Vaping Use: Never used  Substance and Sexual Activity  . Alcohol use: Never  . Drug use: Never  . Sexual activity: Not Currently    Birth control/protection: None  Other Topics Concern  . Not on file  Social History Narrative   Lives with daughter   Social Determinants of Health   Financial Resource Strain: Not on file  Food Insecurity: Not on file  Transportation Needs: Not on file  Physical Activity: Not on file  Stress: Not on file  Social Connections: Not on file  Intimate Partner Violence: Not on file    FAMILY HISTORY: Family History  Problem Relation Age of Onset  . Colon cancer Brother 80       exposure to chemicals [place]  . Hypertension Mother   . Stroke Mother   . Kidney failure Father   . Breast cancer Neg Hx   . Ovarian cancer Neg Hx     ALLERGIES:  is allergic to sulfamethoxazole-trimethoprim.  MEDICATIONS:  Current Outpatient Medications  Medication Sig Dispense  Refill  . apixaban (ELIQUIS) 2.5 MG TABS tablet Take 1 tablet (2.5 mg total) by mouth 2 (two) times daily. 60 tablet 5  . Cholecalciferol (VITAMIN D3) 2000 units capsule Take 2,000 Units by mouth daily.    . fexofenadine (ALLEGRA) 180 MG tablet Take 180 mg by mouth daily.    . fluticasone (FLONASE) 50 MCG/ACT nasal spray Place 1 spray into both nostrils 2 (two) times a day.     . gabapentin (NEURONTIN) 100 MG capsule Take 1 capsule (100 mg total) by mouth at bedtime. 30 capsule 0  . ipratropium (ATROVENT) 0.03 % nasal spray     . lidocaine-prilocaine (EMLA) cream Apply 1 application topically as needed. 30 g 6  . loperamide (IMODIUM) 2 MG capsule Take 1 capsule (2 mg total) by mouth See admin instructions. With onset of loose stool, take 24m followed by 253mevery 2 hours,  Maximum: 16 mg/day 120 capsule 1  . Multiple Vitamins-Minerals (ONE-A-DAY WOMENS 50 PLUS PO) Take 1 tablet by mouth daily.     . ondansetron (ZOFRAN) 8 MG tablet Take 1 tablet (8 mg total) by mouth 2 (two) times daily as needed for refractory nausea / vomiting. Start on day 3 after chemotherapy. 30 tablet 1  . potassium chloride SA (K-DUR) 20 MEQ tablet  Take 20 mEq by mouth daily.     . simvastatin (ZOCOR) 40 MG tablet Take 40 mg by mouth at bedtime.     . triamterene-hydrochlorothiazide (DYAZIDE) 37.5-25 MG capsule Take 1 capsule by mouth daily.     Marland Kitchen zinc gluconate 50 MG tablet Take 50 mg by mouth daily.    . diclofenac sodium (VOLTAREN) 1 % GEL Apply 2 g topically 4 (four) times daily as needed (joint pain).  (Patient not taking: No sig reported)  11  . docusate sodium (COLACE) 100 MG capsule Take 100 mg by mouth 2 (two) times daily as needed. (Patient not taking: Reported on 02/07/2021)    . prochlorperazine (COMPAZINE) 10 MG tablet Take 1 tablet (10 mg total) by mouth every 6 (six) hours as needed (NAUSEA). (Patient not taking: Reported on 02/07/2021) 30 tablet 1   No current facility-administered medications for this visit.      PHYSICAL EXAMINATION: ECOG PERFORMANCE STATUS: 1 - Symptomatic but completely ambulatory  Physical Exam Constitutional:      General: She is not in acute distress. HENT:     Head: Normocephalic and atraumatic.  Eyes:     General: No scleral icterus. Cardiovascular:     Rate and Rhythm: Normal rate and regular rhythm.     Heart sounds: Normal heart sounds.  Pulmonary:     Effort: Pulmonary effort is normal. No respiratory distress.     Breath sounds: No wheezing.  Abdominal:     General: Bowel sounds are normal. There is no distension.     Palpations: Abdomen is soft.     Comments: + Colostomy bag   Musculoskeletal:        General: No deformity. Normal range of motion.     Cervical back: Normal range of motion and neck supple.  Skin:    General: Skin is warm and dry.     Findings: No erythema or rash.  Neurological:     Mental Status: She is alert and oriented to person, place, and time. Mental status is at baseline.     Cranial Nerves: No cranial nerve deficit.     Coordination: Coordination normal.  Psychiatric:        Mood and Affect: Mood normal.    CMP Latest Ref Rng & Units 02/07/2021  Glucose 70 - 99 mg/dL 101(H)  BUN 8 - 23 mg/dL 9  Creatinine 0.44 - 1.00 mg/dL 0.97  Sodium 135 - 145 mmol/L 139  Potassium 3.5 - 5.1 mmol/L 3.6  Chloride 98 - 111 mmol/L 100  CO2 22 - 32 mmol/L 28  Calcium 8.9 - 10.3 mg/dL 9.0  Total Protein 6.5 - 8.1 g/dL 6.8  Total Bilirubin 0.3 - 1.2 mg/dL 0.5  Alkaline Phos 38 - 126 U/L 48  AST 15 - 41 U/L 45(H)  ALT 0 - 44 U/L 27   CBC Latest Ref Rng & Units 02/07/2021  WBC 4.0 - 10.5 K/uL 3.4(L)  Hemoglobin 12.0 - 15.0 g/dL 11.2(L)  Hematocrit 36.0 - 46.0 % 34.7(L)  Platelets 150 - 400 K/uL 155    LABORATORY DATA:  I have reviewed the data as listed Lab Results  Component Value Date   WBC 3.4 (L) 02/07/2021   HGB 11.2 (L) 02/07/2021   HCT 34.7 (L) 02/07/2021   MCV 91.8 02/07/2021   PLT 155 02/07/2021   Recent Labs     03/19/20 0826 06/23/20 1124 07/16/20 0802 07/23/20 1345 01/18/21 1041 01/31/21 0834 02/07/21 0850  NA 142 140 140   < >  141 138 139  K 4.1 3.7 3.2*   < > 4.2 3.5 3.6  CL 102 100 101   < > 101 102 100  CO2 _0 < > _1 GLUCOSE 95 114* 114*   < > 93 99 101*  BUN _2 < > _3 CREATININE 1.01* 0.97 0.87   < > 0.82 0.89 0.97  CALCIUM 9.9 9.1 9.2   < > 9.7 9.6 9.0  GFRNONAA 56* 58* >60   < > >60 >60 >60  GFRAA >60 >60 >60  --   --   --   --   PROT 7.5 7.0 6.9   < > 7.3 7.0 6.8  ALBUMIN 4.4 4.0 3.6   < > 4.2 4.1 3.9  AST _4 < > 33 36 45*  ALT _5 < > _6 ALKPHOS 37* 41 41   < > 49 46 48  BILITOT 0.8 0.4 0.5   < > 0.5 0.8 0.5   < > = values in this interval not displayed.   Iron/TIBC/Ferritin/ %Sat    Component Value Date/Time   IRON 40 06/23/2020 1124   TIBC 246 (L) 06/23/2020 1124   FERRITIN 338 (H) 06/23/2020 1124   IRONPCTSAT 16 06/23/2020 1124     RADIOGRAPHIC STUDIES: I have personally reviewed the radiological images as listed and agreed with the findings in the report. CT CHEST ABDOMEN PELVIS W CONTRAST  Result Date: 01/20/2021 CLINICAL DATA:  Restaging rectal cancer diagnosed 03/05/2019. Status post APR with vaginal recurrence. EXAM: CT CHEST, ABDOMEN, AND PELVIS WITH CONTRAST TECHNIQUE: Multidetector CT imaging of the chest, abdomen and pelvis was performed following the standard protocol during bolus administration of intravenous contrast. CONTRAST:  130m OMNIPAQUE IOHEXOL 300 MG/ML  SOLN COMPARISON:  Multiple priors including PET-CT July 20, 2020 and CT chest abdomen and pelvis October 20, 2020 FINDINGS: CT CHEST FINDINGS Cardiovascular: Right chest wall Port-A-Cath with tip in the right atrium. Atherosclerotic thoracic aortic calcifications. No thoracic aortic aneurysm. No central pulmonary embolus. Normal size heart. No significant pericardial effusion/thickening. Mediastinum/Nodes: No discrete thyroid nodule. The trachea and  esophagus are unremarkable. No pathologically enlarged mediastinal, hilar or axillary lymph nodes. Lungs/Pleura: There are multiple scattered new and/or enlarging bilateral pulmonary nodules. For instance: Increased size of the left upper lobe pulmonary nodule which now measures 4 mm on image 37/4 previously 3 mm. Increased size of a right upper lobe pulmonary nodule which now measures 3 mm on image 47/4 previously 2 mm. Increased size of the a right middle lobe pulmonary nodule which now measures 8 mm previously 6 mm. New 2 mm left upper lobe pulmonary nodule on image 21/4. New 3 mm right upper lobe pulmonary nodule on image 68/4. No pleural effusion.  No pneumothorax.  No focal consolidation. Musculoskeletal: No aggressive lytic or blastic lesions of bone. CT ABDOMEN PELVIS FINDINGS Hepatobiliary: No suspicious hepatic lesions. Gallbladder is surgically absent. No biliary ductal dilation. Pancreas: Within normal limits. Spleen: Within normal limits. Adrenals/Urinary Tract: Bilateral adrenal glands are unremarkable. No hydronephrosis. Bilateral renal cysts. No solid enhancing renal lesions. Urinary bladder is grossly unremarkable for degree of distension Stomach/Bowel: The stomach and small bowel are grossly unremarkable. The terminal ileum and appendix are both unremarkable. No acute inflammatory process or obstructive findings. Stable surgical changes of APR and left lower quadrant colostomy. There is mild colonic wall thickening involving the distal  transverse colon at the splenic flexure which appears similar to prior and may be related to under distension. Colonic diverticulosis without findings of acute diverticulitis. Vascular/Lymphatic: Aortic and branch vessel atherosclerosis. No gastrohepatic or hepatoduodenal ligament lymphadenopathy. No retroperitoneal or mesenteric lymphadenopathy. No pelvic sidewall lymphadenopathy. New mildly enlarged bilateral inguinal lymph nodes which measures 1.2 cm on the right  on image 104/3 and 1.4 cm on the left on image 102/3. Reproductive: Surgically absent Other: Persistent moderate vaginal wall thickening without discrete mass visualized. Musculoskeletal: Multilevel degenerative changes spine. Post radiation change in the pelvis and sacrum. No aggressive lytic or blastic lesion of bone. IMPRESSION: 1. Multiple scattered new and/or enlarging bilateral pulmonary nodules, consistent with pulmonary metastatic disease. 2. Persistent moderate vaginal wall thickening without discrete mass. New mildly enlarged bilateral inguinal lymph nodes, nonspecific but concerning for disease involvement. 3. Mild colonic wall thickening involving the distal transverse colon at the splenic flexure which appears similar to prior and may be related to under distension. Consider attention on follow-up imaging versus colonoscopy. 4. Stable surgical changes of APR and left lower quadrant colostomy. 5. Aortic atherosclerosis. Aortic Atherosclerosis (ICD10-I70.0). Electronically Signed   By: Dahlia Bailiff MD   On: 01/20/2021 11:23   MM 3D SCREEN BREAST BILATERAL  Result Date: 01/12/2021 CLINICAL DATA:  Screening. EXAM: DIGITAL SCREENING BILATERAL MAMMOGRAM WITH TOMOSYNTHESIS AND CAD TECHNIQUE: Bilateral screening digital craniocaudal and mediolateral oblique mammograms were obtained. Bilateral screening digital breast tomosynthesis was performed. The images were evaluated with computer-aided detection. COMPARISON:  Previous exam(s). ACR Breast Density Category b: There are scattered areas of fibroglandular density. FINDINGS: There are no findings suspicious for malignancy. The images were evaluated with computer-aided detection. IMPRESSION: No mammographic evidence of malignancy. A result letter of this screening mammogram will be mailed directly to the patient. RECOMMENDATION: Screening mammogram in one year. (Code:SM-B-01Y) BI-RADS CATEGORY  1: Negative. Electronically Signed   By: Dorise Bullion III  M.D   On: 01/12/2021 16:28    CT CHEST ABDOMEN PELVIS W CONTRAST  Result Date: 01/20/2021 CLINICAL DATA:  Restaging rectal cancer diagnosed 03/05/2019. Status post APR with vaginal recurrence. EXAM: CT CHEST, ABDOMEN, AND PELVIS WITH CONTRAST TECHNIQUE: Multidetector CT imaging of the chest, abdomen and pelvis was performed following the standard protocol during bolus administration of intravenous contrast. CONTRAST:  167m OMNIPAQUE IOHEXOL 300 MG/ML  SOLN COMPARISON:  Multiple priors including PET-CT July 20, 2020 and CT chest abdomen and pelvis October 20, 2020 FINDINGS: CT CHEST FINDINGS Cardiovascular: Right chest wall Port-A-Cath with tip in the right atrium. Atherosclerotic thoracic aortic calcifications. No thoracic aortic aneurysm. No central pulmonary embolus. Normal size heart. No significant pericardial effusion/thickening. Mediastinum/Nodes: No discrete thyroid nodule. The trachea and esophagus are unremarkable. No pathologically enlarged mediastinal, hilar or axillary lymph nodes. Lungs/Pleura: There are multiple scattered new and/or enlarging bilateral pulmonary nodules. For instance: Increased size of the left upper lobe pulmonary nodule which now measures 4 mm on image 37/4 previously 3 mm. Increased size of a right upper lobe pulmonary nodule which now measures 3 mm on image 47/4 previously 2 mm. Increased size of the a right middle lobe pulmonary nodule which now measures 8 mm previously 6 mm. New 2 mm left upper lobe pulmonary nodule on image 21/4. New 3 mm right upper lobe pulmonary nodule on image 68/4. No pleural effusion.  No pneumothorax.  No focal consolidation. Musculoskeletal: No aggressive lytic or blastic lesions of bone. CT ABDOMEN PELVIS FINDINGS Hepatobiliary: No suspicious hepatic lesions. Gallbladder is surgically absent. No  biliary ductal dilation. Pancreas: Within normal limits. Spleen: Within normal limits. Adrenals/Urinary Tract: Bilateral adrenal glands are unremarkable.  No hydronephrosis. Bilateral renal cysts. No solid enhancing renal lesions. Urinary bladder is grossly unremarkable for degree of distension Stomach/Bowel: The stomach and small bowel are grossly unremarkable. The terminal ileum and appendix are both unremarkable. No acute inflammatory process or obstructive findings. Stable surgical changes of APR and left lower quadrant colostomy. There is mild colonic wall thickening involving the distal transverse colon at the splenic flexure which appears similar to prior and may be related to under distension. Colonic diverticulosis without findings of acute diverticulitis. Vascular/Lymphatic: Aortic and branch vessel atherosclerosis. No gastrohepatic or hepatoduodenal ligament lymphadenopathy. No retroperitoneal or mesenteric lymphadenopathy. No pelvic sidewall lymphadenopathy. New mildly enlarged bilateral inguinal lymph nodes which measures 1.2 cm on the right on image 104/3 and 1.4 cm on the left on image 102/3. Reproductive: Surgically absent Other: Persistent moderate vaginal wall thickening without discrete mass visualized. Musculoskeletal: Multilevel degenerative changes spine. Post radiation change in the pelvis and sacrum. No aggressive lytic or blastic lesion of bone. IMPRESSION: 1. Multiple scattered new and/or enlarging bilateral pulmonary nodules, consistent with pulmonary metastatic disease. 2. Persistent moderate vaginal wall thickening without discrete mass. New mildly enlarged bilateral inguinal lymph nodes, nonspecific but concerning for disease involvement. 3. Mild colonic wall thickening involving the distal transverse colon at the splenic flexure which appears similar to prior and may be related to under distension. Consider attention on follow-up imaging versus colonoscopy. 4. Stable surgical changes of APR and left lower quadrant colostomy. 5. Aortic atherosclerosis. Aortic Atherosclerosis (ICD10-I70.0). Electronically Signed   By: Dahlia Bailiff MD    On: 01/20/2021 11:23   MM 3D SCREEN BREAST BILATERAL  Result Date: 01/12/2021 CLINICAL DATA:  Screening. EXAM: DIGITAL SCREENING BILATERAL MAMMOGRAM WITH TOMOSYNTHESIS AND CAD TECHNIQUE: Bilateral screening digital craniocaudal and mediolateral oblique mammograms were obtained. Bilateral screening digital breast tomosynthesis was performed. The images were evaluated with computer-aided detection. COMPARISON:  Previous exam(s). ACR Breast Density Category b: There are scattered areas of fibroglandular density. FINDINGS: There are no findings suspicious for malignancy. The images were evaluated with computer-aided detection. IMPRESSION: No mammographic evidence of malignancy. A result letter of this screening mammogram will be mailed directly to the patient. RECOMMENDATION: Screening mammogram in one year. (Code:[id number]) BI-RADS CATEGORY  1: Negative. Electronically Signed   By: Dorise Bullion III M.D   On: 01/12/2021 16:28    ASSESSMENT & PLAN:  1. Rectal cancer ([place])   2. History of pulmonary embolism   3. History of DVT (deep vein thrombosis)   4. Urinary frequency   5. Chemotherapy induced diarrhea   Cancer Staging Rectal cancer [facility]) Staging form: Colon and Rectum, AJCC 8th Edition - Clinical stage from 01/23/2019: Stage IIIC (cT4b, cN1a, cM0) - Signed by Earlie Server, MD on 01/23/2019 - Pathologic stage from 10/06/2019: Stage IIC (ypT4b, pN0, cM0) - Signed by Earlie Server, MD on 10/06/2019 - Pathologic: Stage Unknown (rpTX, pNX, cM1) - Signed by Earlie Server, MD on 01/31/2021  #History of stage IIIC Rectal cancer, s/p TNT, followed by 09/17/19 APR/posterior vaginectomy/TAH/BSO/VY-flap pT4b pN0 with close vaginal margin 0.2 mm.  Uterus and ovaries negative for malignancy. palliative radiation to vaginal recurrence.  Finished 09/04/2020. Recurrent metastatic rectal cancer with lung metastasis.-Goals of care, discussed.  Palliative intent. Now on palliative FOLFIRI/bevacizumab chemotherapy- Irinotecan 150  mg/m.Marland Kitchen  Tolerates well labs are reviewed and discussed with patient  #Chemotherapy-induced diarrhea, symptoms are controlled with Imodium. Patient will receive  1 L of IVF normal saline today for hydration.  Encourage oral hydration.Marland Kitchen  #Frequent urination, no dysuria or flank pain today.  No fever or chills.  I will check a UA. #History of pulmonary embolism, history of bilateral DVT continue Eliquis 2.5 mg twice daily.  Follow-up 1 week for next cycle of treatment.  Earlie Server, MD, PhD

## 2021-02-08 ENCOUNTER — Other Ambulatory Visit: Payer: Self-pay

## 2021-02-08 MED ORDER — IPRATROPIUM BROMIDE 0.03 % NA SOLN: 1.0000 | Freq: Two times a day (BID) | NASAL | 2 refills | Status: DC

## 2021-02-08 MED ORDER — FLUTICASONE PROPIONATE 50 MCG/ACT NA SUSP: 1.0000 | Freq: Every day | NASAL | 1 refills | Status: DC

## 2021-02-14 ENCOUNTER — Other Ambulatory Visit: Payer: Self-pay

## 2021-02-14 ENCOUNTER — Inpatient Hospital Stay (HOSPITAL_BASED_OUTPATIENT_CLINIC_OR_DEPARTMENT_OTHER): Payer: Medicare Other | Admitting: Oncology

## 2021-02-14 ENCOUNTER — Encounter: Payer: Self-pay | Admitting: Oncology

## 2021-02-14 ENCOUNTER — Inpatient Hospital Stay: Payer: Medicare Other | Attending: Oncology

## 2021-02-14 ENCOUNTER — Inpatient Hospital Stay: Payer: Medicare Other

## 2021-02-14 VITALS — BP 120/60 | HR 66 | Temp 96.0°F | Resp 18 | Wt 172.0 lb

## 2021-02-14 DIAGNOSIS — Z8042 Family history of malignant neoplasm of prostate: Secondary | ICD-10-CM | POA: Insufficient documentation

## 2021-02-14 DIAGNOSIS — C786 Secondary malignant neoplasm of retroperitoneum and peritoneum: Secondary | ICD-10-CM | POA: Diagnosis not present

## 2021-02-14 DIAGNOSIS — C20 Malignant neoplasm of rectum: Secondary | ICD-10-CM

## 2021-02-14 DIAGNOSIS — R32 Unspecified urinary incontinence: Secondary | ICD-10-CM | POA: Insufficient documentation

## 2021-02-14 DIAGNOSIS — R609 Edema, unspecified: Secondary | ICD-10-CM | POA: Insufficient documentation

## 2021-02-14 DIAGNOSIS — T451X5A Adverse effect of antineoplastic and immunosuppressive drugs, initial encounter: Secondary | ICD-10-CM

## 2021-02-14 DIAGNOSIS — Z923 Personal history of irradiation: Secondary | ICD-10-CM | POA: Diagnosis not present

## 2021-02-14 DIAGNOSIS — R918 Other nonspecific abnormal finding of lung field: Secondary | ICD-10-CM | POA: Insufficient documentation

## 2021-02-14 DIAGNOSIS — Z86718 Personal history of other venous thrombosis and embolism: Secondary | ICD-10-CM | POA: Diagnosis not present

## 2021-02-14 DIAGNOSIS — Z79899 Other long term (current) drug therapy: Secondary | ICD-10-CM | POA: Diagnosis not present

## 2021-02-14 DIAGNOSIS — Z800 Family history of malignant neoplasm of digestive organs: Secondary | ICD-10-CM | POA: Insufficient documentation

## 2021-02-14 DIAGNOSIS — C541 Malignant neoplasm of endometrium: Secondary | ICD-10-CM | POA: Diagnosis not present

## 2021-02-14 DIAGNOSIS — Z7901 Long term (current) use of anticoagulants: Secondary | ICD-10-CM | POA: Insufficient documentation

## 2021-02-14 DIAGNOSIS — K521 Toxic gastroenteritis and colitis: Secondary | ICD-10-CM | POA: Diagnosis not present

## 2021-02-14 DIAGNOSIS — R197 Diarrhea, unspecified: Secondary | ICD-10-CM | POA: Insufficient documentation

## 2021-02-14 DIAGNOSIS — Z86711 Personal history of pulmonary embolism: Secondary | ICD-10-CM

## 2021-02-14 DIAGNOSIS — E876 Hypokalemia: Secondary | ICD-10-CM | POA: Insufficient documentation

## 2021-02-14 DIAGNOSIS — I700 Atherosclerosis of aorta: Secondary | ICD-10-CM | POA: Insufficient documentation

## 2021-02-14 DIAGNOSIS — Z5111 Encounter for antineoplastic chemotherapy: Secondary | ICD-10-CM

## 2021-02-14 DIAGNOSIS — K219 Gastro-esophageal reflux disease without esophagitis: Secondary | ICD-10-CM | POA: Diagnosis not present

## 2021-02-14 DIAGNOSIS — Z87891 Personal history of nicotine dependence: Secondary | ICD-10-CM | POA: Insufficient documentation

## 2021-02-14 DIAGNOSIS — E7800 Pure hypercholesterolemia, unspecified: Secondary | ICD-10-CM | POA: Insufficient documentation

## 2021-02-14 DIAGNOSIS — I10 Essential (primary) hypertension: Secondary | ICD-10-CM | POA: Insufficient documentation

## 2021-02-14 DIAGNOSIS — Z5189 Encounter for other specified aftercare: Secondary | ICD-10-CM | POA: Diagnosis not present

## 2021-02-14 DIAGNOSIS — K5900 Constipation, unspecified: Secondary | ICD-10-CM | POA: Insufficient documentation

## 2021-02-14 DIAGNOSIS — R634 Abnormal weight loss: Secondary | ICD-10-CM | POA: Diagnosis not present

## 2021-02-14 DIAGNOSIS — D702 Other drug-induced agranulocytosis: Secondary | ICD-10-CM | POA: Diagnosis not present

## 2021-02-14 DIAGNOSIS — C799 Secondary malignant neoplasm of unspecified site: Secondary | ICD-10-CM

## 2021-02-14 LAB — CBC WITH DIFFERENTIAL/PLATELET
Abs Immature Granulocytes: 0.01 10*3/uL (ref 0.00–0.07)
Basophils Absolute: 0 10*3/uL (ref 0.0–0.1)
Basophils Relative: 1 %
Eosinophils Absolute: 0.1 10*3/uL (ref 0.0–0.5)
Eosinophils Relative: 4 %
HCT: 34.8 % — ABNORMAL LOW (ref 36.0–46.0)
Hemoglobin: 11.4 g/dL — ABNORMAL LOW (ref 12.0–15.0)
Immature Granulocytes: 0 %
Lymphocytes Relative: 43 %
Lymphs Abs: 1 10*3/uL (ref 0.7–4.0)
MCH: 30.2 pg (ref 26.0–34.0)
MCHC: 32.8 g/dL (ref 30.0–36.0)
MCV: 92.1 fL (ref 80.0–100.0)
Monocytes Absolute: 0.3 10*3/uL (ref 0.1–1.0)
Monocytes Relative: 12 %
Neutro Abs: 1 10*3/uL — ABNORMAL LOW (ref 1.7–7.7)
Neutrophils Relative %: 40 %
Platelets: 196 10*3/uL (ref 150–400)
RBC: 3.78 MIL/uL — ABNORMAL LOW (ref 3.87–5.11)
RDW: 14.1 % (ref 11.5–15.5)
WBC: 2.4 10*3/uL — ABNORMAL LOW (ref 4.0–10.5)
nRBC: 0 % (ref 0.0–0.2)

## 2021-02-14 LAB — COMPREHENSIVE METABOLIC PANEL
ALT: 20 U/L (ref 0–44)
AST: 30 U/L (ref 15–41)
Albumin: 3.8 g/dL (ref 3.5–5.0)
Alkaline Phosphatase: 55 U/L (ref 38–126)
Anion gap: 11 (ref 5–15)
BUN: 10 mg/dL (ref 8–23)
CO2: 27 mmol/L (ref 22–32)
Calcium: 9.2 mg/dL (ref 8.9–10.3)
Chloride: 101 mmol/L (ref 98–111)
Creatinine, Ser: 0.79 mg/dL (ref 0.44–1.00)
GFR, Estimated: >60 mL/min (ref >60)
Glucose, Bld: 96 mg/dL (ref 70–99)
Potassium: 3.4 mmol/L — ABNORMAL LOW (ref 3.5–5.1)
Sodium: 139 mmol/L (ref 135–145)
Total Bilirubin: 0.6 mg/dL (ref 0.3–1.2)
Total Protein: 6.9 g/dL (ref 6.5–8.1)

## 2021-02-14 LAB — PROTEIN, URINE, RANDOM: Total Protein, Urine: <6 mg/dL

## 2021-02-14 MED ORDER — HEPARIN SOD (PORK) LOCK FLUSH 100 UNIT/ML IV SOLN
500.0000 [IU] | Freq: Once | INTRAVENOUS | Status: AC
  Administered 2021-02-14: 500 [IU] via INTRAVENOUS
  Filled 2021-02-14: qty 5

## 2021-02-14 MED ORDER — SODIUM CHLORIDE 0.9% FLUSH
10.0000 mL | Freq: Once | INTRAVENOUS | Status: AC
  Administered 2021-02-14: 10 mL via INTRAVENOUS
  Filled 2021-02-14: qty 10

## 2021-02-14 NOTE — Progress Notes (Signed)
Hematology/Oncology progress note [facility] [phone] [phone]   Patient Care Team: Karamalegos, Alexander J, DO as PCP - General (Family Medicine) Stanton, Kristi D, RN as Registered Nurse Lillieann Pavlich, MD as Consulting Physician (Hematology and Oncology)  REFERRING PROVIDER: Dr.Secord CHIEF COMPLAINTS/REASON FOR VISIT:  Follow up for rectal cancer  HISTORY OF PRESENTING ILLNESS:  Jacqueline Yoder is a  74 y.o.  female with PMH listed below who was referred to me for evaluation of endometrial cancer.   Patient initially presented with complaints of postmenopausal bleeding on 08/16/2018.  History of was menopausal vaginal bleeding in 2016 which resulted in cervical polypectomy.  Pathology 02/04/2015 showed cervical polyp, consistent with benign endometrial polyp.  [name] after polypectomy due to anxiety associated with pelvic exams.  pelvic exam on 08/16/2018 reviewed cervical abnormality and from enlarged uterus. Seen by Dr. Cherry on 10/29/2018.  Endometrial biopsy and a Pap smear was performed. 10/29/2018 Pap smear showed adenocarcinoma, favor endometrial origin. 10/29/2018 endometrial biopsy showed endometrioid carcinoma, FIGO grade 1.  10/29/2018- TA & TV Ultrasound revealed: Anteverted uterus measuring 8.7 x 5.6 x 6.4 cm without evidence of focal masses.  The endometrium measuring 24.1 mm (thickened) and heterogeneous.  Right and left ovaries not visualized.  No adnexal masses identified.  No free fluid in cul-de-sac.  Patient was seen by Dr. Secord in clinic on 11/13/2018.  Cervical exam reveals 2 cm exophytic irregular mass consistent with malignancy.   11/19/2018 CT chest abdomen pelvis with contrast showed thickened endometrium with some irregularity compatible with the provided diagnosis of endometrial malignancy.  There is a mildly prominent left inguinal node 1.4 cm.  Patient was seen by Dr. Berchuck on 11/20/2018 and left groin  lymph node biopsy was recommended.  11/26/2018 patient underwent left inguinal lymph node biopsy. Pathology showed metastatic adenocarcinoma consistent with colorectal origin.  CDX 2+.  Case was discussed on tumor board.  Recommend colonoscopy for further evaluation.  Patient reports significant weight loss 30 pounds over the last year.  Chronic vaginal spotting. Change of bowel habits the past few months.  More constipated.  Family history positive for brother who has colon cancer prostate cancer.  patient has underwent colonoscopy on 12/03/2018 which reviewed a nonobstructing large mass in the rectum.  Also chronic fistula.  Mass was not circumferential.  This was biopsied with a cold forceps for histology.  Pathology came back hyperplastic polyp negative for dysplasia and malignancy. [name], patient underwent flex sigmoidoscopy on 12/06/2018 with rebiopsy of the rectal mass. This time biopsy results came back positive for invasive colorectal adenocarcinoma, moderately differentiated. Immunotherapy for nearly mismatch repair protein (MMR ) was performed.  There is no loss of MMR expression.  low probability of MSI high.   # Seen by [name] for evaluation of resectability for rectal cancer. In addition, she also had a second opinion with [facility] where her endometrial biopsy pathology was changed to  adenocarcinoma, consistent with colorectal primary.   [name]. She has home health that has been assisting with ostomy care  Patient was also evaluated by [facility] oncology.  Recommendation is to proceed with TNT with concurrent chemoradiation followed by neoadjuvant chemotherapy followed by surgical resection. Patient prefers to have treatment done locally with [facility].   # Oncology Treatment:  02/03/2019- 03/19/2019  concurrent Xeloda and radiation.  Xeloda dose 825mg /m2 BID - rounded to 1650mg BID- on days of  radiation. 04/09/2019, started   on FOLFOX with bolus 5-FU omitted.  07/16/2019 finished 8 cycles of FOLFOX.  # Patient has underwent 09/17/19 APR/posterior vaginectomy/TAH/BSO/VY-flap pT4b pN0 with close vaginal margin 0.2 mm.  Uterus and ovaries negative for malignancy. Patient reports bilateral lower extremity numbness and tingling, intermittent, left worse than right. She has lost a lot of weight since her APR surgery.   Surveillance plan Recommend history and physical/tumor marker monitoring every 3 to 6 months for the first 2 years after surgery and then every 6 months for a total of 5 years. Chest abdomen pelvis every 6 to 12 months for total 5 years. Colonoscopy 1 year after surgery, if no advanced adenoma, repeat in 3 years and then 5 years.  #Family history with half brother having's history of colon cancer prostate cancer.  Personal history of colorectal cancer.  Patient has not decided if she wants genetic testing.   # vaginal introitus mass biopsied. Pathology is consistent with metastatic colorectal adenocarcinoma  # history of PE( 01/13/2020)  in the bilateral lower extremity DVT (01/13/2020).   She finishes 6 months of anticoagulation with Eliquis 5 mg twice daily. Now switched to Eliquis 2.5 mg twice daily..  # She has now developed recurrent disease. I have discussed with [name]  Dr. Hester Mates and the mass is not resectable. Patient has also had colonoscopy by Dr. Vicente Males yesterday. Normal examination. # 07/16/2020 cycle 1 FOLFIRI  # 07/20/2020 PET scan was done for further evaluation, images are consistent with local recurrence, no distant metastasis. #Discussed with radiation oncology Dr. Baruch Gouty will recommends concurrent chemotherapy and radiation. 08/02/2020-08/16/2020, patient starts radiation.  Xeloda was held due to neutropenia 08/17/2020,-09/06/2020 Xeloda 1500 mg twice daily concurrently with radiation INTERVAL HISTORY Jacqueline Yoder is a 75 y.o. female who has above  history reviewed by me presents for follow-up of rectal cancer. #Patient is status post cycle 1 systemic chemotherapy with FOLFIRI/bevacizumab.   Denies any loose stool episodes today.  No fever chills, nausea vomiting.   Review of Systems  Constitutional: Negative for appetite change, chills, fatigue, fever and unexpected weight change.  HENT:   Negative for hearing loss and voice change.   Eyes: Negative for eye problems.  Respiratory: Negative for chest tightness and cough.   Cardiovascular: Negative for chest pain.  Gastrointestinal: Negative for abdominal distention, abdominal pain, blood in stool, constipation, diarrhea and nausea.  Endocrine: Negative for hot flashes.  Genitourinary: Negative for difficulty urinating and frequency.   Musculoskeletal: Negative for arthralgias.  Skin: Negative for itching and rash.  Neurological: Negative for extremity weakness and numbness.  Hematological: Negative for adenopathy.  Psychiatric/Behavioral: Negative for confusion.    MEDICAL HISTORY:  Past Medical History:  Diagnosis Date  . Allergy   . Arthritis   . Blood clot in vein   . Family history of colon cancer   . GERD (gastroesophageal reflux disease)   . Hypercholesteremia   . Hypertension   . Hypertension   . Lower extremity edema   . Rectal cancer ([place]) 12/2018  . Urinary incontinence     SURGICAL HISTORY: Past Surgical History:  Procedure Laterality Date  . ABDOMINAL HYSTERECTOMY    . CHOLECYSTECTOMY  1971  . COLONOSCOPY WITH PROPOFOL N/A 12/03/2018   Procedure: COLONOSCOPY WITH PROPOFOL;  Surgeon: Lucilla Lame, MD;  Location: [facility] ENDOSCOPY;  Service: Endoscopy;  Laterality: N/A;  . COLONOSCOPY WITH PROPOFOL N/A 07/15/2020   Procedure: COLONOSCOPY WITH PROPOFOL;  Surgeon: [name], MD;  Location: [facility] ENDOSCOPY;  Service: Gastroenterology;  Laterality: N/A;  .  FLEXIBLE SIGMOIDOSCOPY N/A 12/06/2018   Procedure: FLEXIBLE SIGMOIDOSCOPY;  Surgeon: [name], MD;   Location: [facility] ENDOSCOPY;  Service: Endoscopy;  Laterality: N/A;  . LAPAROSCOPIC COLOSTOMY  01/06/2019  . PORTACATH PLACEMENT N/A 04/03/2019   Procedure: INSERTION PORT-A-CATH;  Surgeon: Jules Husbands, MD;  Location: [facility] ORS;  Service: General;  Laterality: N/A;    SOCIAL HISTORY: Social History   Socioeconomic History  . Marital status: Married    Spouse name: Not on file  . Number of children: Not on file  . Years of education: Not on file  . Highest education level: Not on file  Occupational History  . Not on file  Tobacco Use  . Smoking status: Former Smoker    Quit date: 12/02/1977    Years since quitting: 43.2  . Smokeless tobacco: Former Network engineer  . Vaping Use: Never used  Substance and Sexual Activity  . Alcohol use: Never  . Drug use: Never  . Sexual activity: Not Currently    Birth control/protection: None  Other Topics Concern  . Not on file  Social History Narrative   Lives with daughter   Social Determinants of Health   Financial Resource Strain: Not on file  Food Insecurity: Not on file  Transportation Needs: Not on file  Physical Activity: Not on file  Stress: Not on file  Social Connections: Not on file  Intimate Partner Violence: Not on file    FAMILY HISTORY: Family History  Problem Relation Age of Onset  . Colon cancer Brother 15       exposure to chemicals [place]  . Hypertension Mother   . Stroke Mother   . Kidney failure Father   . Breast cancer Neg Hx   . Ovarian cancer Neg Hx     ALLERGIES:  is allergic to sulfamethoxazole-trimethoprim.  MEDICATIONS:  Current Outpatient Medications  Medication Sig Dispense Refill  . apixaban (ELIQUIS) 2.5 MG TABS tablet Take 1 tablet (2.5 mg total) by mouth 2 (two) times daily. 60 tablet 5  . Cholecalciferol (VITAMIN D3) 2000 units capsule Take 2,000 Units by mouth daily.    . fexofenadine (ALLEGRA) 180 MG tablet Take 180 mg by mouth daily.    . fluticasone (FLONASE) 50 MCG/ACT nasal  spray Place 1 spray into both nostrils daily. 16 g 1  . gabapentin (NEURONTIN) 100 MG capsule Take 1 capsule (100 mg total) by mouth at bedtime. 30 capsule 0  . ipratropium (ATROVENT) 0.03 % nasal spray Place 1 spray into both nostrils 2 (two) times daily. 30 mL 2  . lidocaine-prilocaine (EMLA) cream Apply 1 application topically as needed. 30 g 6  . loperamide (IMODIUM) 2 MG capsule Take 1 capsule (2 mg total) by mouth See admin instructions. With onset of loose stool, take $RemoveBef'4mg'TDURntYvKp$  followed by $RemoveBef'2mg'UDhGWjnTAf$  every 2 hours,  Maximum: 16 mg/day 120 capsule 1  . Multiple Vitamins-Minerals (ONE-A-DAY WOMENS 50 PLUS PO) Take 1 tablet by mouth daily.     . ondansetron (ZOFRAN) 8 MG tablet Take 1 tablet (8 mg total) by mouth 2 (two) times daily as needed for refractory nausea / vomiting. Start on day 3 after chemotherapy. 30 tablet 1  . potassium chloride SA (K-DUR) 20 MEQ tablet Take 20 mEq by mouth daily.     . simvastatin (ZOCOR) 40 MG tablet Take 40 mg by mouth at bedtime.     . triamterene-hydrochlorothiazide (DYAZIDE) 37.5-25 MG capsule Take 1 capsule by mouth daily.     Marland Kitchen zinc gluconate 50  MG tablet Take 50 mg by mouth daily.    . diclofenac sodium (VOLTAREN) 1 % GEL Apply 2 g topically 4 (four) times daily as needed (joint pain).  (Patient not taking: No sig reported)  11  . docusate sodium (COLACE) 100 MG capsule Take 100 mg by mouth 2 (two) times daily as needed. (Patient not taking: No sig reported)    . prochlorperazine (COMPAZINE) 10 MG tablet Take 1 tablet (10 mg total) by mouth every 6 (six) hours as needed (NAUSEA). (Patient not taking: No sig reported) 30 tablet 1   No current facility-administered medications for this visit.     PHYSICAL EXAMINATION: ECOG PERFORMANCE STATUS: 1 - Symptomatic but completely ambulatory  Physical Exam Constitutional:      General: She is not in acute distress. HENT:     Head: Normocephalic and atraumatic.  Eyes:     General: No scleral icterus. Cardiovascular:      Rate and Rhythm: Normal rate and regular rhythm.     Heart sounds: Normal heart sounds.  Pulmonary:     Effort: Pulmonary effort is normal. No respiratory distress.     Breath sounds: No wheezing.  Abdominal:     General: Bowel sounds are normal. There is no distension.     Palpations: Abdomen is soft.     Comments: + Colostomy bag   Musculoskeletal:        General: No deformity. Normal range of motion.     Cervical back: Normal range of motion and neck supple.  Skin:    General: Skin is warm and dry.     Findings: No erythema or rash.  Neurological:     Mental Status: She is alert and oriented to person, place, and time. Mental status is at baseline.     Cranial Nerves: No cranial nerve deficit.     Coordination: Coordination normal.  Psychiatric:        Mood and Affect: Mood normal.    CMP Latest Ref Rng & Units 02/14/2021  Glucose 70 - 99 mg/dL 96  BUN 8 - 23 mg/dL 10  Creatinine 0.44 - 1.00 mg/dL 0.79  Sodium 135 - 145 mmol/L 139  Potassium 3.5 - 5.1 mmol/L 3.4(L)  Chloride 98 - 111 mmol/L 101  CO2 22 - 32 mmol/L 27  Calcium 8.9 - 10.3 mg/dL 9.2  Total Protein 6.5 - 8.1 g/dL 6.9  Total Bilirubin 0.3 - 1.2 mg/dL 0.6  Alkaline Phos 38 - 126 U/L 55  AST 15 - 41 U/L 30  ALT 0 - 44 U/L 20   CBC Latest Ref Rng & Units 02/14/2021  WBC 4.0 - 10.5 K/uL 2.4(L)  Hemoglobin 12.0 - 15.0 g/dL 11.4(L)  Hematocrit 36.0 - 46.0 % 34.8(L)  Platelets 150 - 400 K/uL 196    LABORATORY DATA:  I have reviewed the data as listed Lab Results  Component Value Date   WBC 2.4 (L) 02/14/2021   HGB 11.4 (L) 02/14/2021   HCT 34.8 (L) 02/14/2021   MCV 92.1 02/14/2021   PLT 196 02/14/2021   Recent Labs    03/19/20 0826 06/23/20 1124 07/16/20 0802 07/23/20 1345 01/31/21 0834 02/07/21 0850 02/14/21 0843  NA 142 140 140   < > 138 139 139  K 4.1 3.7 3.2*   < > 3.5 3.6 3.4*  CL 102 100 101   < > 102 100 101  CO2 $Re'31 30 29   'oJD$ < > $R'28 28 27  'Yx$ GLUCOSE 95 114* 114*   < >  99 101* 96  BUN $Re'12  10 9   'ipv$ < > $R'12 9 10  'wy$ CREATININE 1.01* 0.97 0.87   < > 0.89 0.97 0.79  CALCIUM 9.9 9.1 9.2   < > 9.6 9.0 9.2  GFRNONAA 56* 58* >60   < > >60 >60 >60  GFRAA >60 >60 >60  --   --   --   --   PROT 7.5 7.0 6.9   < > 7.0 6.8 6.9  ALBUMIN 4.4 4.0 3.6   < > 4.1 3.9 3.8  AST $Re'25 25 24   'HZU$ < > 36 45* 30  ALT $Re'16 15 14   'EzK$ < > $R'23 27 20  'Bk$ ALKPHOS 37* 41 41   < > 46 48 55  BILITOT 0.8 0.4 0.5   < > 0.8 0.5 0.6   < > = values in this interval not displayed.   Iron/TIBC/Ferritin/ %Sat    Component Value Date/Time   IRON 40 06/23/2020 1124   TIBC 246 (L) 06/23/2020 1124   FERRITIN 338 (H) 06/23/2020 1124   IRONPCTSAT 16 06/23/2020 1124     RADIOGRAPHIC STUDIES: I have personally reviewed the radiological images as listed and agreed with the findings in the report. CT CHEST ABDOMEN PELVIS W CONTRAST  Result Date: 01/20/2021 CLINICAL DATA:  Restaging rectal cancer diagnosed 03/05/2019. Status post APR with vaginal recurrence. EXAM: CT CHEST, ABDOMEN, AND PELVIS WITH CONTRAST TECHNIQUE: Multidetector CT imaging of the chest, abdomen and pelvis was performed following the standard protocol during bolus administration of intravenous contrast. CONTRAST:  136mL OMNIPAQUE IOHEXOL 300 MG/ML  SOLN COMPARISON:  Multiple priors including PET-CT July 20, 2020 and CT chest abdomen and pelvis October 20, 2020 FINDINGS: CT CHEST FINDINGS Cardiovascular: Right chest wall Port-A-Cath with tip in the right atrium. Atherosclerotic thoracic aortic calcifications. No thoracic aortic aneurysm. No central pulmonary embolus. Normal size heart. No significant pericardial effusion/thickening. Mediastinum/Nodes: No discrete thyroid nodule. The trachea and esophagus are unremarkable. No pathologically enlarged mediastinal, hilar or axillary lymph nodes. Lungs/Pleura: There are multiple scattered new and/or enlarging bilateral pulmonary nodules. For instance: Increased size of the left upper lobe pulmonary nodule which now measures 4 mm on  image 37/4 previously 3 mm. Increased size of a right upper lobe pulmonary nodule which now measures 3 mm on image 47/4 previously 2 mm. Increased size of the a right middle lobe pulmonary nodule which now measures 8 mm previously 6 mm. New 2 mm left upper lobe pulmonary nodule on image 21/4. New 3 mm right upper lobe pulmonary nodule on image 68/4. No pleural effusion.  No pneumothorax.  No focal consolidation. Musculoskeletal: No aggressive lytic or blastic lesions of bone. CT ABDOMEN PELVIS FINDINGS Hepatobiliary: No suspicious hepatic lesions. Gallbladder is surgically absent. No biliary ductal dilation. Pancreas: Within normal limits. Spleen: Within normal limits. Adrenals/Urinary Tract: Bilateral adrenal glands are unremarkable. No hydronephrosis. Bilateral renal cysts. No solid enhancing renal lesions. Urinary bladder is grossly unremarkable for degree of distension Stomach/Bowel: The stomach and small bowel are grossly unremarkable. The terminal ileum and appendix are both unremarkable. No acute inflammatory process or obstructive findings. Stable surgical changes of APR and left lower quadrant colostomy. There is mild colonic wall thickening involving the distal transverse colon at the splenic flexure which appears similar to prior and may be related to under distension. Colonic diverticulosis without findings of acute diverticulitis. Vascular/Lymphatic: Aortic and branch vessel atherosclerosis. No gastrohepatic or hepatoduodenal ligament lymphadenopathy. No retroperitoneal or mesenteric lymphadenopathy. No pelvic sidewall lymphadenopathy. New  mildly enlarged bilateral inguinal lymph nodes which measures 1.2 cm on the right on image 104/3 and 1.4 cm on the left on image 102/3. Reproductive: Surgically absent Other: Persistent moderate vaginal wall thickening without discrete mass visualized. Musculoskeletal: Multilevel degenerative changes spine. Post radiation change in the pelvis and sacrum. No aggressive  lytic or blastic lesion of bone. IMPRESSION: 1. Multiple scattered new and/or enlarging bilateral pulmonary nodules, consistent with pulmonary metastatic disease. 2. Persistent moderate vaginal wall thickening without discrete mass. New mildly enlarged bilateral inguinal lymph nodes, nonspecific but concerning for disease involvement. 3. Mild colonic wall thickening involving the distal transverse colon at the splenic flexure which appears similar to prior and may be related to under distension. Consider attention on follow-up imaging versus colonoscopy. 4. Stable surgical changes of APR and left lower quadrant colostomy. 5. Aortic atherosclerosis. Aortic Atherosclerosis (ICD10-I70.0). Electronically Signed   By: Dahlia Bailiff MD   On: 01/20/2021 11:23   MM 3D SCREEN BREAST BILATERAL  Result Date: 01/12/2021 CLINICAL DATA:  Screening. EXAM: DIGITAL SCREENING BILATERAL MAMMOGRAM WITH TOMOSYNTHESIS AND CAD TECHNIQUE: Bilateral screening digital craniocaudal and mediolateral oblique mammograms were obtained. Bilateral screening digital breast tomosynthesis was performed. The images were evaluated with computer-aided detection. COMPARISON:  Previous exam(s). ACR Breast Density Category b: There are scattered areas of fibroglandular density. FINDINGS: There are no findings suspicious for malignancy. The images were evaluated with computer-aided detection. IMPRESSION: No mammographic evidence of malignancy. A result letter of this screening mammogram will be mailed directly to the patient. RECOMMENDATION: Screening mammogram in one year. (Code:SM-B-01Y) BI-RADS CATEGORY  1: Negative. Electronically Signed   By: Dorise Bullion III M.D   On: 01/12/2021 16:28    CT CHEST ABDOMEN PELVIS W CONTRAST  Result Date: 01/20/2021 CLINICAL DATA:  Restaging rectal cancer diagnosed 03/05/2019. Status post APR with vaginal recurrence. EXAM: CT CHEST, ABDOMEN, AND PELVIS WITH CONTRAST TECHNIQUE: Multidetector CT imaging of the  chest, abdomen and pelvis was performed following the standard protocol during bolus administration of intravenous contrast. CONTRAST:  156mL OMNIPAQUE IOHEXOL 300 MG/ML  SOLN COMPARISON:  Multiple priors including PET-CT July 20, 2020 and CT chest abdomen and pelvis October 20, 2020 FINDINGS: CT CHEST FINDINGS Cardiovascular: Right chest wall Port-A-Cath with tip in the right atrium. Atherosclerotic thoracic aortic calcifications. No thoracic aortic aneurysm. No central pulmonary embolus. Normal size heart. No significant pericardial effusion/thickening. Mediastinum/Nodes: No discrete thyroid nodule. The trachea and esophagus are unremarkable. No pathologically enlarged mediastinal, hilar or axillary lymph nodes. Lungs/Pleura: There are multiple scattered new and/or enlarging bilateral pulmonary nodules. For instance: Increased size of the left upper lobe pulmonary nodule which now measures 4 mm on image 37/4 previously 3 mm. Increased size of a right upper lobe pulmonary nodule which now measures 3 mm on image 47/4 previously 2 mm. Increased size of the a right middle lobe pulmonary nodule which now measures 8 mm previously 6 mm. New 2 mm left upper lobe pulmonary nodule on image 21/4. New 3 mm right upper lobe pulmonary nodule on image 68/4. No pleural effusion.  No pneumothorax.  No focal consolidation. Musculoskeletal: No aggressive lytic or blastic lesions of bone. CT ABDOMEN PELVIS FINDINGS Hepatobiliary: No suspicious hepatic lesions. Gallbladder is surgically absent. No biliary ductal dilation. Pancreas: Within normal limits. Spleen: Within normal limits. Adrenals/Urinary Tract: Bilateral adrenal glands are unremarkable. No hydronephrosis. Bilateral renal cysts. No solid enhancing renal lesions. Urinary bladder is grossly unremarkable for degree of distension Stomach/Bowel: The stomach and small bowel are grossly unremarkable. The  terminal ileum and appendix are both unremarkable. No acute inflammatory  process or obstructive findings. Stable surgical changes of APR and left lower quadrant colostomy. There is mild colonic wall thickening involving the distal transverse colon at the splenic flexure which appears similar to prior and may be related to under distension. Colonic diverticulosis without findings of acute diverticulitis. Vascular/Lymphatic: Aortic and branch vessel atherosclerosis. No gastrohepatic or hepatoduodenal ligament lymphadenopathy. No retroperitoneal or mesenteric lymphadenopathy. No pelvic sidewall lymphadenopathy. New mildly enlarged bilateral inguinal lymph nodes which measures 1.2 cm on the right on image 104/3 and 1.4 cm on the left on image 102/3. Reproductive: Surgically absent Other: Persistent moderate vaginal wall thickening without discrete mass visualized. Musculoskeletal: Multilevel degenerative changes spine. Post radiation change in the pelvis and sacrum. No aggressive lytic or blastic lesion of bone. IMPRESSION: 1. Multiple scattered new and/or enlarging bilateral pulmonary nodules, consistent with pulmonary metastatic disease. 2. Persistent moderate vaginal wall thickening without discrete mass. New mildly enlarged bilateral inguinal lymph nodes, nonspecific but concerning for disease involvement. 3. Mild colonic wall thickening involving the distal transverse colon at the splenic flexure which appears similar to prior and may be related to under distension. Consider attention on follow-up imaging versus colonoscopy. 4. Stable surgical changes of APR and left lower quadrant colostomy. 5. Aortic atherosclerosis. Aortic Atherosclerosis (ICD10-I70.0). Electronically Signed   By: Dahlia Bailiff MD   On: 01/20/2021 11:23   MM 3D SCREEN BREAST BILATERAL  Result Date: 01/12/2021 CLINICAL DATA:  Screening. EXAM: DIGITAL SCREENING BILATERAL MAMMOGRAM WITH TOMOSYNTHESIS AND CAD TECHNIQUE: Bilateral screening digital craniocaudal and mediolateral oblique mammograms were obtained.  Bilateral screening digital breast tomosynthesis was performed. The images were evaluated with computer-aided detection. COMPARISON:  Previous exam(s). ACR Breast Density Category b: There are scattered areas of fibroglandular density. FINDINGS: There are no findings suspicious for malignancy. The images were evaluated with computer-aided detection. IMPRESSION: No mammographic evidence of malignancy. A result letter of this screening mammogram will be mailed directly to the patient. RECOMMENDATION: Screening mammogram in one year. (Code:SM-B-01Y) BI-RADS CATEGORY  1: Negative. Electronically Signed   By: Dorise Bull[place] III M.D   On: 01/12/2021 16:28    ASSESSMENT & PLAN:  1. Rectal cancer ([place])   2. History of pulmonary embolism   3. History of DVT (deep vein thrombosis)   4. Chemotherapy induced diarrhea   5. Encounter for antineoplastic chemotherapy   Cancer Staging Rectal cancer [facility]) Staging form: Colon and Rectum, AJCC 8th Edition - Clinical stage from 01/23/2019: Stage IIIC (cT4b, cN1a, cM0) - Signed by Earlie Server, MD on 01/23/2019 - Pathologic stage from 10/06/2019: Stage IIC (ypT4b, pN0, cM0) - Signed by Earlie Server, MD on 10/06/2019 - Pathologic: Stage Unknown (rpTX, pNX, cM1) - Signed by Earlie Server, MD on 01/31/2021  #History of stage IIIC Rectal cancer, s/p TNT, followed by 09/17/19 APR/posterior vaginectomy/TAH/BSO/VY-flap pT4b pN0 with close vaginal margin 0.2 mm.  Uterus and ovaries negative for malignancy. palliative radiation to vaginal recurrence.  Finished 09/04/2020. Recurrent metastatic rectal cancer with lung metastasis.-Goals of care, discussed.  Palliative intent. Now on palliative FOLFIRI/bevacizumab chemotherapy- Irinotecan 150 mg/m.Marland Kitchen  Hold off chemotherapy due to [place] less than 1.5. Delay chemotherapy to next week.  Plan to add G-CSF.  #Chemotherapy-induced neutropenia, see above #Chemotherapy-induced diarrhea, symptoms are controlled with Imodium. #Hypokalemia, potassium 3.4,  continue potassium chloride 20 mEq daily. #History of pulmonary embolism, history of bilateral DVT continue Eliquis 2.5 mg twice daily.  Follow-up 1 week for next cycle of treatment.  Talbert Cage  Tasia Catchings, MD, PhD

## 2021-02-14 NOTE — Progress Notes (Signed)
Pt here for follow up. No new concerns voiced.   

## 2021-02-16 ENCOUNTER — Inpatient Hospital Stay: Payer: Medicare Other

## 2021-02-17 ENCOUNTER — Ambulatory Visit
Admission: RE | Admit: 2021-02-17 | Discharge: 2021-02-17 | Disposition: A | Discharge status: Home / Self Care | Payer: Medicare Other | Source: Ambulatory Visit | Attending: Radiation Oncology | Admitting: Radiation Oncology

## 2021-02-17 ENCOUNTER — Encounter: Payer: Self-pay | Admitting: Radiation Oncology

## 2021-02-17 VITALS — BP 126/69 | HR 66 | Temp 97.2°F | Wt 174.0 lb

## 2021-02-17 DIAGNOSIS — Z923 Personal history of irradiation: Secondary | ICD-10-CM | POA: Insufficient documentation

## 2021-02-17 DIAGNOSIS — C7800 Secondary malignant neoplasm of unspecified lung: Secondary | ICD-10-CM | POA: Diagnosis not present

## 2021-02-17 DIAGNOSIS — Z9221 Personal history of antineoplastic chemotherapy: Secondary | ICD-10-CM | POA: Insufficient documentation

## 2021-02-17 DIAGNOSIS — C20 Malignant neoplasm of rectum: Secondary | ICD-10-CM | POA: Insufficient documentation

## 2021-02-17 DIAGNOSIS — Z08 Encounter for follow-up examination after completed treatment for malignant neoplasm: Secondary | ICD-10-CM | POA: Diagnosis not present

## 2021-02-17 NOTE — Progress Notes (Signed)
Radiation Oncology Follow up Note  Name: Jacqueline Yoder   Date:   02/17/2021 MRN:  [id number] DOB: 05-23-1946    This 75 y.o. 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  REFERRING PROVIDER: Nobie Putnam *  HPI: Patient is a 75 year old female now about 6 months having completed salvage radiation therapy status post recurrence for a stage IIIc adenocarcinoma the rectum.  Seen today in routine follow-up clinically she is doing well..  She initially had TNT chemotherapy followed by AP resection had a vaginal recurrence and treated with palliative radiation therapy.  She has gone on to have lung metastasis currently is on palliative FOLFIRI bevacizumab which she is tolerating well although currently on hold secondary to an [place] less than 1.5.  She specifically denies rectal pain cough hemoptysis or chest tightness.  The pulmonary metastases are small not obstructing and asymptomatic at this time.  COMPLICATIONS OF TREATMENT: none  FOLLOW UP COMPLIANCE: keeps appointments   PHYSICAL EXAM:  BP 126/69   Pulse 66   Temp (!) 97.2 F (36.2 C) (Tympanic)   Wt 174 lb (78.9 kg)   BMI 28.96 kg/m  Well-developed well-nourished patient in NAD. HEENT reveals PERLA, EOMI, discs not visualized.  Oral cavity is clear. No oral mucosal lesions are identified. Neck is clear without evidence of cervical or supraclavicular adenopathy. Lungs are clear to A&P. Cardiac examination is essentially unremarkable with regular rate and rhythm without murmur rub or thrill. Abdomen is benign with no organomegaly or masses noted. Motor sensory and DTR levels are equal and symmetric in the upper and lower extremities. Cranial nerves II through XII are grossly intact. Proprioception is intact. No peripheral adenopathy or edema is identified. No motor or sensory levels  are noted. Crude visual fields are within normal range.  RADIOLOGY RESULTS: CT scan chest abdomen pelvis reviewed compatible with above-stated findings  PLAN: Present time patient continues on systemic chemotherapy and immunotherapy under medical oncology's direction.  I have asked to see her back in 6 months for follow-up.  Be happy to reevaluate her anytime should palliative radiation therapy be indicated.  Patient knows to call with any concerns.  I would like to take this opportunity to thank you for allowing me to participate in the care of your patient.Noreene Filbert, MD

## 2021-02-21 ENCOUNTER — Inpatient Hospital Stay (HOSPITAL_BASED_OUTPATIENT_CLINIC_OR_DEPARTMENT_OTHER): Payer: Medicare Other | Admitting: Oncology

## 2021-02-21 ENCOUNTER — Other Ambulatory Visit: Payer: Self-pay

## 2021-02-21 ENCOUNTER — Inpatient Hospital Stay: Payer: Medicare Other

## 2021-02-21 ENCOUNTER — Encounter: Payer: Self-pay | Admitting: Oncology

## 2021-02-21 VITALS — BP 114/77 | HR 62 | Temp 97.1°F | Resp 16 | Wt 171.7 lb

## 2021-02-21 DIAGNOSIS — R609 Edema, unspecified: Secondary | ICD-10-CM | POA: Diagnosis not present

## 2021-02-21 DIAGNOSIS — Z79899 Other long term (current) drug therapy: Secondary | ICD-10-CM | POA: Diagnosis not present

## 2021-02-21 DIAGNOSIS — C20 Malignant neoplasm of rectum: Secondary | ICD-10-CM | POA: Diagnosis not present

## 2021-02-21 DIAGNOSIS — R197 Diarrhea, unspecified: Secondary | ICD-10-CM | POA: Diagnosis not present

## 2021-02-21 DIAGNOSIS — K521 Toxic gastroenteritis and colitis: Secondary | ICD-10-CM

## 2021-02-21 DIAGNOSIS — C799 Secondary malignant neoplasm of unspecified site: Secondary | ICD-10-CM

## 2021-02-21 DIAGNOSIS — I700 Atherosclerosis of aorta: Secondary | ICD-10-CM | POA: Diagnosis not present

## 2021-02-21 DIAGNOSIS — T451X5A Adverse effect of antineoplastic and immunosuppressive drugs, initial encounter: Secondary | ICD-10-CM

## 2021-02-21 DIAGNOSIS — D701 Agranulocytosis secondary to cancer chemotherapy: Secondary | ICD-10-CM | POA: Diagnosis not present

## 2021-02-21 DIAGNOSIS — Z87891 Personal history of nicotine dependence: Secondary | ICD-10-CM | POA: Diagnosis not present

## 2021-02-21 DIAGNOSIS — Z5189 Encounter for other specified aftercare: Secondary | ICD-10-CM | POA: Diagnosis not present

## 2021-02-21 DIAGNOSIS — Z86718 Personal history of other venous thrombosis and embolism: Secondary | ICD-10-CM | POA: Diagnosis not present

## 2021-02-21 DIAGNOSIS — Z923 Personal history of irradiation: Secondary | ICD-10-CM | POA: Diagnosis not present

## 2021-02-21 DIAGNOSIS — Z5111 Encounter for antineoplastic chemotherapy: Secondary | ICD-10-CM

## 2021-02-21 DIAGNOSIS — E876 Hypokalemia: Secondary | ICD-10-CM | POA: Diagnosis not present

## 2021-02-21 DIAGNOSIS — K219 Gastro-esophageal reflux disease without esophagitis: Secondary | ICD-10-CM | POA: Diagnosis not present

## 2021-02-21 DIAGNOSIS — K5900 Constipation, unspecified: Secondary | ICD-10-CM | POA: Diagnosis not present

## 2021-02-21 DIAGNOSIS — R918 Other nonspecific abnormal finding of lung field: Secondary | ICD-10-CM | POA: Diagnosis not present

## 2021-02-21 DIAGNOSIS — D702 Other drug-induced agranulocytosis: Secondary | ICD-10-CM | POA: Diagnosis not present

## 2021-02-21 DIAGNOSIS — E7800 Pure hypercholesterolemia, unspecified: Secondary | ICD-10-CM | POA: Diagnosis not present

## 2021-02-21 DIAGNOSIS — I10 Essential (primary) hypertension: Secondary | ICD-10-CM | POA: Diagnosis not present

## 2021-02-21 DIAGNOSIS — Z86711 Personal history of pulmonary embolism: Secondary | ICD-10-CM | POA: Diagnosis not present

## 2021-02-21 DIAGNOSIS — R634 Abnormal weight loss: Secondary | ICD-10-CM | POA: Diagnosis not present

## 2021-02-21 DIAGNOSIS — Z95828 Presence of other vascular implants and grafts: Secondary | ICD-10-CM

## 2021-02-21 DIAGNOSIS — Z7901 Long term (current) use of anticoagulants: Secondary | ICD-10-CM | POA: Diagnosis not present

## 2021-02-21 DIAGNOSIS — C786 Secondary malignant neoplasm of retroperitoneum and peritoneum: Secondary | ICD-10-CM | POA: Diagnosis not present

## 2021-02-21 LAB — CBC WITH DIFFERENTIAL/PLATELET
Abs Immature Granulocytes: 0.01 10*3/uL (ref 0.00–0.07)
Basophils Absolute: 0 10*3/uL (ref 0.0–0.1)
Basophils Relative: 1 %
Eosinophils Absolute: 0.1 10*3/uL (ref 0.0–0.5)
Eosinophils Relative: 3 %
HCT: 36.5 % (ref 36.0–46.0)
Hemoglobin: 11.8 g/dL — ABNORMAL LOW (ref 12.0–15.0)
Immature Granulocytes: 0 %
Lymphocytes Relative: 48 %
Lymphs Abs: 1.3 10*3/uL (ref 0.7–4.0)
MCH: 30 pg (ref 26.0–34.0)
MCHC: 32.3 g/dL (ref 30.0–36.0)
MCV: 92.9 fL (ref 80.0–100.0)
Monocytes Absolute: 0.4 10*3/uL (ref 0.1–1.0)
Monocytes Relative: 15 %
Neutro Abs: 0.9 10*3/uL — ABNORMAL LOW (ref 1.7–7.7)
Neutrophils Relative %: 33 %
Platelets: 218 10*3/uL (ref 150–400)
RBC: 3.93 MIL/uL (ref 3.87–5.11)
RDW: 14.6 % (ref 11.5–15.5)
WBC: 2.7 10*3/uL — ABNORMAL LOW (ref 4.0–10.5)
nRBC: 0 % (ref 0.0–0.2)

## 2021-02-21 LAB — COMPREHENSIVE METABOLIC PANEL
ALT: 19 U/L (ref 0–44)
AST: 36 U/L (ref 15–41)
Albumin: 3.9 g/dL (ref 3.5–5.0)
Alkaline Phosphatase: 48 U/L (ref 38–126)
Anion gap: 11 (ref 5–15)
BUN: 10 mg/dL (ref 8–23)
CO2: 27 mmol/L (ref 22–32)
Calcium: 9.3 mg/dL (ref 8.9–10.3)
Chloride: 101 mmol/L (ref 98–111)
Creatinine, Ser: 0.9 mg/dL (ref 0.44–1.00)
GFR, Estimated: >60 mL/min (ref >60)
Glucose, Bld: 104 mg/dL — ABNORMAL HIGH (ref 70–99)
Potassium: 3.7 mmol/L (ref 3.5–5.1)
Sodium: 139 mmol/L (ref 135–145)
Total Bilirubin: 0.5 mg/dL (ref 0.3–1.2)
Total Protein: 6.9 g/dL (ref 6.5–8.1)

## 2021-02-21 LAB — PROTEIN, URINE, RANDOM: Total Protein, Urine: 10 mg/dL

## 2021-02-21 MED ORDER — HEPARIN SOD (PORK) LOCK FLUSH 100 UNIT/ML IV SOLN
500.0000 [IU] | Freq: Once | INTRAVENOUS | Status: AC
  Administered 2021-02-21: 500 [IU] via INTRAVENOUS
  Filled 2021-02-21: qty 5

## 2021-02-21 MED ORDER — SODIUM CHLORIDE 0.9% FLUSH
10.0000 mL | Freq: Once | INTRAVENOUS | Status: AC
  Administered 2021-02-21: 10 mL via INTRAVENOUS
  Filled 2021-02-21: qty 10

## 2021-02-21 NOTE — Progress Notes (Signed)
Hematology/Oncology progress note  [facility] [phone] [phone]   Patient Care Team: Karamalegos, Alexander J, DO as PCP - General (Family Medicine) Stanton, Kristi D, RN as Registered Nurse Ashdon Gillson, MD as Consulting Physician (Hematology and Oncology)  REFERRING PROVIDER: Dr.Secord CHIEF COMPLAINTS/REASON FOR VISIT:  Follow up for rectal cancer  HISTORY OF PRESENTING ILLNESS:  Jacqueline Yoder is a  74 y.o.  female with PMH listed below who was referred to me for evaluation of endometrial cancer.   Patient initially presented with complaints of postmenopausal bleeding on 08/16/2018.  History of was menopausal vaginal bleeding in 2016 which resulted in cervical polypectomy.  Pathology 02/04/2015 showed cervical polyp, consistent with benign endometrial polyp.  [name] after polypectomy due to anxiety associated with pelvic exams.  pelvic exam on 08/16/2018 reviewed cervical abnormality and from enlarged uterus. Seen by Dr. Cherry on 10/29/2018.  Endometrial biopsy and a Pap smear was performed. 10/29/2018 Pap smear showed adenocarcinoma, favor endometrial origin. 10/29/2018 endometrial biopsy showed endometrioid carcinoma, FIGO grade 1.  10/29/2018- TA & TV Ultrasound revealed: Anteverted uterus measuring 8.7 x 5.6 x 6.4 cm without evidence of focal masses.  The endometrium measuring 24.1 mm (thickened) and heterogeneous.  Right and left ovaries not visualized.  No adnexal masses identified.  No free fluid in cul-de-sac.  Patient was seen by Dr. Secord in clinic on 11/13/2018.  Cervical exam reveals 2 cm exophytic irregular mass consistent with malignancy.   11/19/2018 CT chest abdomen pelvis with contrast showed thickened endometrium with some irregularity compatible with the provided diagnosis of endometrial malignancy.  There is a mildly prominent left inguinal node 1.4 cm.  Patient was seen by Dr. Berchuck on 11/20/2018 and left groin  lymph node biopsy was recommended.  11/26/2018 patient underwent left inguinal lymph node biopsy. Pathology showed metastatic adenocarcinoma consistent with colorectal origin.  CDX 2+.  Case was discussed on tumor board.  Recommend colonoscopy for further evaluation.  Patient reports significant weight loss 30 pounds over the last year.  Chronic vaginal spotting. Change of bowel habits the past few months.  More constipated.  Family history positive for brother who has colon cancer prostate cancer.  patient has underwent colonoscopy on 12/03/2018 which reviewed a nonobstructing large mass in the rectum.  Also chronic fistula.  Mass was not circumferential.  This was biopsied with a cold forceps for histology.  Pathology came back hyperplastic polyp negative for dysplasia and malignancy. [name], patient underwent flex sigmoidoscopy on 12/06/2018 with rebiopsy of the rectal mass. This time biopsy results came back positive for invasive colorectal adenocarcinoma, moderately differentiated. Immunotherapy for nearly mismatch repair protein (MMR ) was performed.  There is no loss of MMR expression.  low probability of MSI high.   # Seen by [name] for evaluation of resectability for rectal cancer. In addition, she also had a second opinion with [facility] where her endometrial biopsy pathology was changed to  adenocarcinoma, consistent with colorectal primary.   [name]. She has home health that has been assisting with ostomy care  Patient was also evaluated by [facility] oncology.  Recommendation is to proceed with TNT with concurrent chemoradiation followed by neoadjuvant chemotherapy followed by surgical resection. Patient prefers to have treatment done locally with [facility].   # Oncology Treatment:  02/03/2019- 03/19/2019  concurrent Xeloda and radiation.  Xeloda dose 825mg /m2 BID - rounded to 1650mg BID- on days of  radiation. 04/09/2019, started   on FOLFOX with bolus 5-FU omitted.  07/16/2019 finished 8 cycles of FOLFOX.  # Patient has underwent 09/17/19 APR/posterior vaginectomy/TAH/BSO/VY-flap pT4b pN0 with close vaginal margin 0.2 mm.  Uterus and ovaries negative for malignancy. Patient reports bilateral lower extremity numbness and tingling, intermittent, left worse than right. She has lost a lot of weight since her APR surgery.   Surveillance plan Recommend history and physical/tumor marker monitoring every 3 to 6 months for the first 2 years after surgery and then every 6 months for a total of 5 years. Chest abdomen pelvis every 6 to 12 months for total 5 years. Colonoscopy 1 year after surgery, if no advanced adenoma, repeat in 3 years and then 5 years.  #Family history with half brother having's history of colon cancer prostate cancer.  Personal history of colorectal cancer.  Patient has not decided if she wants genetic testing.   # vaginal introitus mass biopsied. Pathology is consistent with metastatic colorectal adenocarcinoma  # history of PE( 01/13/2020)  in the bilateral lower extremity DVT (01/13/2020).   She finishes 6 months of anticoagulation with Eliquis 5 mg twice daily. Now switched to Eliquis 2.5 mg twice daily..  # She has now developed recurrent disease. I have discussed with [name]  Dr. Hester Mates and the mass is not resectable. Patient has also had colonoscopy by Dr. Vicente Males yesterday. Normal examination. # 07/16/2020 cycle 1 FOLFIRI  # 07/20/2020 PET scan was done for further evaluation, images are consistent with local recurrence, no distant metastasis. #Discussed with radiation oncology Dr. Baruch Gouty will recommends concurrent chemotherapy and radiation. 08/02/2020-08/16/2020, patient starts radiation.  Xeloda was held due to neutropenia 08/17/2020,-09/06/2020 Xeloda 1500 mg twice daily concurrently with radiation INTERVAL HISTORY Jacqueline Yoder is a 75 y.o. female who has above  history reviewed by me presents for follow-up of rectal cancer. #Patient is status post cycle 1 systemic chemotherapy with FOLFIRI/bevacizumab.   Denies any loose stool episodes today.  No fever chills, nausea vomiting. No new complaints.    Review of Systems  Constitutional: Negative for appetite change, chills, fatigue, fever and unexpected weight change.  HENT:   Negative for hearing loss and voice change.   Eyes: Negative for eye problems.  Respiratory: Negative for chest tightness and cough.   Cardiovascular: Negative for chest pain.  Gastrointestinal: Negative for abdominal distention, abdominal pain, blood in stool, constipation, diarrhea and nausea.  Endocrine: Negative for hot flashes.  Genitourinary: Negative for difficulty urinating and frequency.   Musculoskeletal: Negative for arthralgias.  Skin: Negative for itching and rash.  Neurological: Negative for extremity weakness and numbness.  Hematological: Negative for adenopathy.  Psychiatric/Behavioral: Negative for confusion.    MEDICAL HISTORY:  Past Medical History:  Diagnosis Date  . Allergy   . Arthritis   . Blood clot in vein   . Family history of colon cancer   . GERD (gastroesophageal reflux disease)   . Hypercholesteremia   . Hypertension   . Hypertension   . Lower extremity edema   . Rectal cancer ([place]) 12/2018  . Urinary incontinence     SURGICAL HISTORY: Past Surgical History:  Procedure Laterality Date  . ABDOMINAL HYSTERECTOMY    . CHOLECYSTECTOMY  1971  . COLONOSCOPY WITH PROPOFOL N/A 12/03/2018   Procedure: COLONOSCOPY WITH PROPOFOL;  Surgeon: Lucilla Lame, MD;  Location: [facility] ENDOSCOPY;  Service: Endoscopy;  Laterality: N/A;  . COLONOSCOPY WITH PROPOFOL N/A 07/15/2020   Procedure: COLONOSCOPY WITH PROPOFOL;  Surgeon: [name], MD;  Location: [facility] ENDOSCOPY;  Service:  Gastroenterology;  Laterality: N/A;  . FLEXIBLE SIGMOIDOSCOPY N/A 12/06/2018   Procedure: FLEXIBLE SIGMOIDOSCOPY;  Surgeon:  [name], MD;  Location: [facility] ENDOSCOPY;  Service: Endoscopy;  Laterality: N/A;  . LAPAROSCOPIC COLOSTOMY  01/06/2019  . PORTACATH PLACEMENT N/A 04/03/2019   Procedure: INSERTION PORT-A-CATH;  Surgeon: Jules Husbands, MD;  Location: [facility] ORS;  Service: General;  Laterality: N/A;    SOCIAL HISTORY: Social History   Socioeconomic History  . Marital status: Married    Spouse name: Not on file  . Number of children: Not on file  . Years of education: Not on file  . Highest education level: Not on file  Occupational History  . Not on file  Tobacco Use  . Smoking status: Former Smoker    Quit date: 12/02/1977    Years since quitting: 43.2  . Smokeless tobacco: Former Network engineer  . Vaping Use: Never used  Substance and Sexual Activity  . Alcohol use: Never  . Drug use: Never  . Sexual activity: Not Currently    Birth control/protection: None  Other Topics Concern  . Not on file  Social History Narrative   Lives with daughter   Social Determinants of Health   Financial Resource Strain: Not on file  Food Insecurity: Not on file  Transportation Needs: Not on file  Physical Activity: Not on file  Stress: Not on file  Social Connections: Not on file  Intimate Partner Violence: Not on file    FAMILY HISTORY: Family History  Problem Relation Age of Onset  . Colon cancer Brother 34       exposure to chemicals [place]  . Hypertension Mother   . Stroke Mother   . Kidney failure Father   . Breast cancer Neg Hx   . Ovarian cancer Neg Hx     ALLERGIES:  is allergic to sulfamethoxazole-trimethoprim.  MEDICATIONS:  Current Outpatient Medications  Medication Sig Dispense Refill  . apixaban (ELIQUIS) 2.5 MG TABS tablet Take 1 tablet (2.5 mg total) by mouth 2 (two) times daily. 60 tablet 5  . Cholecalciferol (VITAMIN D3) 2000 units capsule Take 2,000 Units by mouth daily.    . fexofenadine (ALLEGRA) 180 MG tablet Take 180 mg by mouth daily.    . fluticasone (FLONASE) 50  MCG/ACT nasal spray Place 1 spray into both nostrils daily. 16 g 1  . gabapentin (NEURONTIN) 100 MG capsule Take 1 capsule (100 mg total) by mouth at bedtime. 30 capsule 0  . ipratropium (ATROVENT) 0.03 % nasal spray Place 1 spray into both nostrils 2 (two) times daily. 30 mL 2  . lidocaine-prilocaine (EMLA) cream Apply 1 application topically as needed. 30 g 6  . loperamide (IMODIUM) 2 MG capsule Take 1 capsule (2 mg total) by mouth See admin instructions. With onset of loose stool, take $RemoveBef'4mg'cfREdfFfoV$  followed by $RemoveBef'2mg'ZSYIbzIRHC$  every 2 hours,  Maximum: 16 mg/day 120 capsule 1  . Multiple Vitamins-Minerals (ONE-A-DAY WOMENS 50 PLUS PO) Take 1 tablet by mouth daily.     . ondansetron (ZOFRAN) 8 MG tablet Take 1 tablet (8 mg total) by mouth 2 (two) times daily as needed for refractory nausea / vomiting. Start on day 3 after chemotherapy. 30 tablet 1  . potassium chloride SA (K-DUR) 20 MEQ tablet Take 20 mEq by mouth daily.     . prochlorperazine (COMPAZINE) 10 MG tablet Take 1 tablet (10 mg total) by mouth every 6 (six) hours as needed (NAUSEA). 30 tablet 1  . simvastatin (ZOCOR) 40 MG tablet  Take 40 mg by mouth at bedtime.     . triamterene-hydrochlorothiazide (DYAZIDE) 37.5-25 MG capsule Take 1 capsule by mouth daily.     Marland Kitchen zinc gluconate 50 MG tablet Take 50 mg by mouth daily.    . diclofenac sodium (VOLTAREN) 1 % GEL Apply 2 g topically 4 (four) times daily as needed (joint pain).  (Patient not taking: No sig reported)  11  . docusate sodium (COLACE) 100 MG capsule Take 100 mg by mouth 2 (two) times daily as needed. (Patient not taking: No sig reported)     No current facility-administered medications for this visit.     PHYSICAL EXAMINATION: ECOG PERFORMANCE STATUS: 1 - Symptomatic but completely ambulatory  Physical Exam Constitutional:      General: She is not in acute distress. HENT:     Head: Normocephalic and atraumatic.  Eyes:     General: No scleral icterus. Cardiovascular:     Rate and Rhythm:  Normal rate and regular rhythm.     Heart sounds: Normal heart sounds.  Pulmonary:     Effort: Pulmonary effort is normal. No respiratory distress.     Breath sounds: No wheezing.  Abdominal:     General: Bowel sounds are normal. There is no distension.     Palpations: Abdomen is soft.     Comments: + Colostomy bag   Musculoskeletal:        General: No deformity. Normal range of motion.     Cervical back: Normal range of motion and neck supple.  Skin:    General: Skin is warm and dry.     Findings: No erythema or rash.  Neurological:     Mental Status: She is alert and oriented to person, place, and time. Mental status is at baseline.     Cranial Nerves: No cranial nerve deficit.     Coordination: Coordination normal.  Psychiatric:        Mood and Affect: Mood normal.    CMP Latest Ref Rng & Units 02/21/2021  Glucose 70 - 99 mg/dL 104(H)  BUN 8 - 23 mg/dL 10  Creatinine 0.44 - 1.00 mg/dL 0.90  Sodium 135 - 145 mmol/L 139  Potassium 3.5 - 5.1 mmol/L 3.7  Chloride 98 - 111 mmol/L 101  CO2 22 - 32 mmol/L 27  Calcium 8.9 - 10.3 mg/dL 9.3  Total Protein 6.5 - 8.1 g/dL 6.9  Total Bilirubin 0.3 - 1.2 mg/dL 0.5  Alkaline Phos 38 - 126 U/L 48  AST 15 - 41 U/L 36  ALT 0 - 44 U/L 19   CBC Latest Ref Rng & Units 02/21/2021  WBC 4.0 - 10.5 K/uL 2.7(L)  Hemoglobin 12.0 - 15.0 g/dL 11.8(L)  Hematocrit 36.0 - 46.0 % 36.5  Platelets 150 - 400 K/uL 218    LABORATORY DATA:  I have reviewed the data as listed Lab Results  Component Value Date   WBC 2.7 (L) 02/21/2021   HGB 11.8 (L) 02/21/2021   HCT 36.5 02/21/2021   MCV 92.9 02/21/2021   PLT 218 02/21/2021   Recent Labs    03/19/20 0826 06/23/20 1124 07/16/20 0802 07/23/20 1345 02/07/21 0850 02/14/21 0843 02/21/21 0835  NA 142 140 140   < > 139 139 139  K 4.1 3.7 3.2*   < > 3.6 3.4* 3.7  CL 102 100 101   < > 100 101 101  CO2 $Re'31 30 29   'mMM$ < > $R'28 27 27  'ac$ GLUCOSE 95 114* 114*   < >  101* 96 104*  BUN $Re'12 10 9   'oeO$ < > $R'9 10 10   'hj$ CREATININE 1.01* 0.97 0.87   < > 0.97 0.79 0.90  CALCIUM 9.9 9.1 9.2   < > 9.0 9.2 9.3  GFRNONAA 56* 58* >60   < > >60 >60 >60  GFRAA >60 >60 >60  --   --   --   --   PROT 7.5 7.0 6.9   < > 6.8 6.9 6.9  ALBUMIN 4.4 4.0 3.6   < > 3.9 3.8 3.9  AST $Re'25 25 24   'vDa$ < > 45* 30 36  ALT $Re'16 15 14   'vbu$ < > $R'27 20 19  'xo$ ALKPHOS 37* 41 41   < > 48 55 48  BILITOT 0.8 0.4 0.5   < > 0.5 0.6 0.5   < > = values in this interval not displayed.   Iron/TIBC/Ferritin/ %Sat    Component Value Date/Time   IRON 40 06/23/2020 1124   TIBC 246 (L) 06/23/2020 1124   FERRITIN 338 (H) 06/23/2020 1124   IRONPCTSAT 16 06/23/2020 1124     RADIOGRAPHIC STUDIES: I have personally reviewed the radiological images as listed and agreed with the findings in the report. CT CHEST ABDOMEN PELVIS W CONTRAST  Result Date: 01/20/2021 CLINICAL DATA:  Restaging rectal cancer diagnosed 03/05/2019. Status post APR with vaginal recurrence. EXAM: CT CHEST, ABDOMEN, AND PELVIS WITH CONTRAST TECHNIQUE: Multidetector CT imaging of the chest, abdomen and pelvis was performed following the standard protocol during bolus administration of intravenous contrast. CONTRAST:  113mL OMNIPAQUE IOHEXOL 300 MG/ML  SOLN COMPARISON:  Multiple priors including PET-CT July 20, 2020 and CT chest abdomen and pelvis October 20, 2020 FINDINGS: CT CHEST FINDINGS Cardiovascular: Right chest wall Port-A-Cath with tip in the right atrium. Atherosclerotic thoracic aortic calcifications. No thoracic aortic aneurysm. No central pulmonary embolus. Normal size heart. No significant pericardial effusion/thickening. Mediastinum/Nodes: No discrete thyroid nodule. The trachea and esophagus are unremarkable. No pathologically enlarged mediastinal, hilar or axillary lymph nodes. Lungs/Pleura: There are multiple scattered new and/or enlarging bilateral pulmonary nodules. For instance: Increased size of the left upper lobe pulmonary nodule which now measures 4 mm on image 37/4 previously  3 mm. Increased size of a right upper lobe pulmonary nodule which now measures 3 mm on image 47/4 previously 2 mm. Increased size of the a right middle lobe pulmonary nodule which now measures 8 mm previously 6 mm. New 2 mm left upper lobe pulmonary nodule on image 21/4. New 3 mm right upper lobe pulmonary nodule on image 68/4. No pleural effusion.  No pneumothorax.  No focal consolidation. Musculoskeletal: No aggressive lytic or blastic lesions of bone. CT ABDOMEN PELVIS FINDINGS Hepatobiliary: No suspicious hepatic lesions. Gallbladder is surgically absent. No biliary ductal dilation. Pancreas: Within normal limits. Spleen: Within normal limits. Adrenals/Urinary Tract: Bilateral adrenal glands are unremarkable. No hydronephrosis. Bilateral renal cysts. No solid enhancing renal lesions. Urinary bladder is grossly unremarkable for degree of distension Stomach/Bowel: The stomach and small bowel are grossly unremarkable. The terminal ileum and appendix are both unremarkable. No acute inflammatory process or obstructive findings. Stable surgical changes of APR and left lower quadrant colostomy. There is mild colonic wall thickening involving the distal transverse colon at the splenic flexure which appears similar to prior and may be related to under distension. Colonic diverticulosis without findings of acute diverticulitis. Vascular/Lymphatic: Aortic and branch vessel atherosclerosis. No gastrohepatic or hepatoduodenal ligament lymphadenopathy. No retroperitoneal or mesenteric lymphadenopathy. No pelvic sidewall lymphadenopathy. New  mildly enlarged bilateral inguinal lymph nodes which measures 1.2 cm on the right on image 104/3 and 1.4 cm on the left on image 102/3. Reproductive: Surgically absent Other: Persistent moderate vaginal wall thickening without discrete mass visualized. Musculoskeletal: Multilevel degenerative changes spine. Post radiation change in the pelvis and sacrum. No aggressive lytic or blastic  lesion of bone. IMPRESSION: 1. Multiple scattered new and/or enlarging bilateral pulmonary nodules, consistent with pulmonary metastatic disease. 2. Persistent moderate vaginal wall thickening without discrete mass. New mildly enlarged bilateral inguinal lymph nodes, nonspecific but concerning for disease involvement. 3. Mild colonic wall thickening involving the distal transverse colon at the splenic flexure which appears similar to prior and may be related to under distension. Consider attention on follow-up imaging versus colonoscopy. 4. Stable surgical changes of APR and left lower quadrant colostomy. 5. Aortic atherosclerosis. Aortic Atherosclerosis (ICD10-I70.0). Electronically Signed   By: Dahlia Bailiff MD   On: 01/20/2021 11:23   MM 3D SCREEN BREAST BILATERAL  Result Date: 01/12/2021 CLINICAL DATA:  Screening. EXAM: DIGITAL SCREENING BILATERAL MAMMOGRAM WITH TOMOSYNTHESIS AND CAD TECHNIQUE: Bilateral screening digital craniocaudal and mediolateral oblique mammograms were obtained. Bilateral screening digital breast tomosynthesis was performed. The images were evaluated with computer-aided detection. COMPARISON:  Previous exam(s). ACR Breast Density Category b: There are scattered areas of fibroglandular density. FINDINGS: There are no findings suspicious for malignancy. The images were evaluated with computer-aided detection. IMPRESSION: No mammographic evidence of malignancy. A result letter of this screening mammogram will be mailed directly to the patient. RECOMMENDATION: Screening mammogram in one year. (Code:SM-B-01Y) BI-RADS CATEGORY  1: Negative. Electronically Signed   By: Dorise Bullion III M.D   On: 01/12/2021 16:28    CT CHEST ABDOMEN PELVIS W CONTRAST  Result Date: 01/20/2021 CLINICAL DATA:  Restaging rectal cancer diagnosed 03/05/2019. Status post APR with vaginal recurrence. EXAM: CT CHEST, ABDOMEN, AND PELVIS WITH CONTRAST TECHNIQUE: Multidetector CT imaging of the chest, abdomen and  pelvis was performed following the standard protocol during bolus administration of intravenous contrast. CONTRAST:  184mL OMNIPAQUE IOHEXOL 300 MG/ML  SOLN COMPARISON:  Multiple priors including PET-CT July 20, 2020 and CT chest abdomen and pelvis October 20, 2020 FINDINGS: CT CHEST FINDINGS Cardiovascular: Right chest wall Port-A-Cath with tip in the right atrium. Atherosclerotic thoracic aortic calcifications. No thoracic aortic aneurysm. No central pulmonary embolus. Normal size heart. No significant pericardial effusion/thickening. Mediastinum/Nodes: No discrete thyroid nodule. The trachea and esophagus are unremarkable. No pathologically enlarged mediastinal, hilar or axillary lymph nodes. Lungs/Pleura: There are multiple scattered new and/or enlarging bilateral pulmonary nodules. For instance: Increased size of the left upper lobe pulmonary nodule which now measures 4 mm on image 37/4 previously 3 mm. Increased size of a right upper lobe pulmonary nodule which now measures 3 mm on image 47/4 previously 2 mm. Increased size of the a right middle lobe pulmonary nodule which now measures 8 mm previously 6 mm. New 2 mm left upper lobe pulmonary nodule on image 21/4. New 3 mm right upper lobe pulmonary nodule on image 68/4. No pleural effusion.  No pneumothorax.  No focal consolidation. Musculoskeletal: No aggressive lytic or blastic lesions of bone. CT ABDOMEN PELVIS FINDINGS Hepatobiliary: No suspicious hepatic lesions. Gallbladder is surgically absent. No biliary ductal dilation. Pancreas: Within normal limits. Spleen: Within normal limits. Adrenals/Urinary Tract: Bilateral adrenal glands are unremarkable. No hydronephrosis. Bilateral renal cysts. No solid enhancing renal lesions. Urinary bladder is grossly unremarkable for degree of distension Stomach/Bowel: The stomach and small bowel are grossly unremarkable. The  terminal ileum and appendix are both unremarkable. No acute inflammatory process or obstructive  findings. Stable surgical changes of APR and left lower quadrant colostomy. There is mild colonic wall thickening involving the distal transverse colon at the splenic flexure which appears similar to prior and may be related to under distension. Colonic diverticulosis without findings of acute diverticulitis. Vascular/Lymphatic: Aortic and branch vessel atherosclerosis. No gastrohepatic or hepatoduodenal ligament lymphadenopathy. No retroperitoneal or mesenteric lymphadenopathy. No pelvic sidewall lymphadenopathy. New mildly enlarged bilateral inguinal lymph nodes which measures 1.2 cm on the right on image 104/3 and 1.4 cm on the left on image 102/3. Reproductive: Surgically absent Other: Persistent moderate vaginal wall thickening without discrete mass visualized. Musculoskeletal: Multilevel degenerative changes spine. Post radiation change in the pelvis and sacrum. No aggressive lytic or blastic lesion of bone. IMPRESSION: 1. Multiple scattered new and/or enlarging bilateral pulmonary nodules, consistent with pulmonary metastatic disease. 2. Persistent moderate vaginal wall thickening without discrete mass. New mildly enlarged bilateral inguinal lymph nodes, nonspecific but concerning for disease involvement. 3. Mild colonic wall thickening involving the distal transverse colon at the splenic flexure which appears similar to prior and may be related to under distension. Consider attention on follow-up imaging versus colonoscopy. 4. Stable surgical changes of APR and left lower quadrant colostomy. 5. Aortic atherosclerosis. Aortic Atherosclerosis (ICD10-I70.0). Electronically Signed   By: Dahlia Bailiff MD   On: 01/20/2021 11:23   MM 3D SCREEN BREAST BILATERAL  Result Date: 01/12/2021 CLINICAL DATA:  Screening. EXAM: DIGITAL SCREENING BILATERAL MAMMOGRAM WITH TOMOSYNTHESIS AND CAD TECHNIQUE: Bilateral screening digital craniocaudal and mediolateral oblique mammograms were obtained. Bilateral screening digital  breast tomosynthesis was performed. The images were evaluated with computer-aided detection. COMPARISON:  Previous exam(s). ACR Breast Density Category b: There are scattered areas of fibroglandular density. FINDINGS: There are no findings suspicious for malignancy. The images were evaluated with computer-aided detection. IMPRESSION: No mammographic evidence of malignancy. A result letter of this screening mammogram will be mailed directly to the patient. RECOMMENDATION: Screening mammogram in one year. (Code:SM-B-01Y) BI-RADS CATEGORY  1: Negative. Electronically Signed   By: Dorise Bullion III M.D   On: 01/12/2021 16:28    ASSESSMENT & PLAN:  1. Encounter for antineoplastic chemotherapy   2. Chemotherapy induced diarrhea   3. Port-A-Cath in place   4. Rectal cancer ([place])   5. Chemotherapy induced neutropenia ([facility])   Cancer Staging Rectal cancer [facility]) Staging form: Colon and Rectum, AJCC 8th Edition - Clinical stage from 01/23/2019: Stage IIIC (cT4b, cN1a, cM0) - Signed by Earlie Server, MD on 01/23/2019 - Pathologic stage from 10/06/2019: Stage IIC (ypT4b, pN0, cM0) - Signed by Earlie Server, MD on 10/06/2019 - Pathologic: Stage Unknown (rpTX, pNX, cM1) - Signed by Earlie Server, MD on 01/31/2021  #History of stage IIIC Rectal cancer, s/p TNT, followed by 09/17/19 APR/posterior vaginectomy/TAH/BSO/VY-flap pT4b pN0 with close vaginal margin 0.2 mm.  Uterus and ovaries negative for malignancy. palliative radiation to vaginal recurrence.  Finished 09/04/2020. Recurrent metastatic rectal cancer with lung metastasis.-Goals of care, discussed.  Palliative intent. Now on palliative FOLFIRI/bevacizumab chemotherapy- Irinotecan 150 mg/m. Hold treatment due to [place] 0.9 Delay chemotherapy to next week.  Plan to add G-CSF.  #Chemotherapy-induced neutropenia, see above #Chemotherapy-induced diarrhea, symptoms are stable.  #Hypokalemia, potassium 3.7, continue potassium chloride 20 mEq daily. #History of pulmonary  embolism, history of bilateral DVT continue Eliquis 2.5 mg twice daily.  Follow-up 1 week for next cycle of treatment.  Earlie Server, MD, PhD

## 2021-02-22 LAB — CEA: CEA: 2221 ng/mL — ABNORMAL HIGH (ref 0.0–4.7)

## 2021-02-23 ENCOUNTER — Inpatient Hospital Stay: Payer: Medicare Other

## 2021-02-23 DIAGNOSIS — Z933 Colostomy status: Secondary | ICD-10-CM | POA: Diagnosis not present

## 2021-02-28 ENCOUNTER — Other Ambulatory Visit: Payer: Self-pay

## 2021-02-28 ENCOUNTER — Inpatient Hospital Stay: Payer: Medicare Other

## 2021-02-28 ENCOUNTER — Encounter: Payer: Self-pay | Admitting: Oncology

## 2021-02-28 ENCOUNTER — Inpatient Hospital Stay (HOSPITAL_BASED_OUTPATIENT_CLINIC_OR_DEPARTMENT_OTHER): Payer: Medicare Other | Admitting: Oncology

## 2021-02-28 VITALS — BP 110/60 | HR 61 | Temp 96.8°F | Resp 18 | Wt 172.7 lb

## 2021-02-28 VITALS — BP 102/60 | HR 68 | Temp 96.8°F | Resp 18

## 2021-02-28 DIAGNOSIS — T451X5A Adverse effect of antineoplastic and immunosuppressive drugs, initial encounter: Secondary | ICD-10-CM

## 2021-02-28 DIAGNOSIS — Z7901 Long term (current) use of anticoagulants: Secondary | ICD-10-CM | POA: Diagnosis not present

## 2021-02-28 DIAGNOSIS — Z5111 Encounter for antineoplastic chemotherapy: Secondary | ICD-10-CM | POA: Diagnosis not present

## 2021-02-28 DIAGNOSIS — D702 Other drug-induced agranulocytosis: Secondary | ICD-10-CM | POA: Diagnosis not present

## 2021-02-28 DIAGNOSIS — I10 Essential (primary) hypertension: Secondary | ICD-10-CM | POA: Diagnosis not present

## 2021-02-28 DIAGNOSIS — Z86718 Personal history of other venous thrombosis and embolism: Secondary | ICD-10-CM | POA: Diagnosis not present

## 2021-02-28 DIAGNOSIS — R609 Edema, unspecified: Secondary | ICD-10-CM | POA: Diagnosis not present

## 2021-02-28 DIAGNOSIS — K5900 Constipation, unspecified: Secondary | ICD-10-CM | POA: Diagnosis not present

## 2021-02-28 DIAGNOSIS — K521 Toxic gastroenteritis and colitis: Secondary | ICD-10-CM

## 2021-02-28 DIAGNOSIS — Z923 Personal history of irradiation: Secondary | ICD-10-CM | POA: Diagnosis not present

## 2021-02-28 DIAGNOSIS — Z87891 Personal history of nicotine dependence: Secondary | ICD-10-CM | POA: Diagnosis not present

## 2021-02-28 DIAGNOSIS — E876 Hypokalemia: Secondary | ICD-10-CM | POA: Diagnosis not present

## 2021-02-28 DIAGNOSIS — R634 Abnormal weight loss: Secondary | ICD-10-CM | POA: Diagnosis not present

## 2021-02-28 DIAGNOSIS — C799 Secondary malignant neoplasm of unspecified site: Secondary | ICD-10-CM | POA: Diagnosis not present

## 2021-02-28 DIAGNOSIS — C20 Malignant neoplasm of rectum: Secondary | ICD-10-CM

## 2021-02-28 DIAGNOSIS — Z5189 Encounter for other specified aftercare: Secondary | ICD-10-CM | POA: Diagnosis not present

## 2021-02-28 DIAGNOSIS — C786 Secondary malignant neoplasm of retroperitoneum and peritoneum: Secondary | ICD-10-CM | POA: Diagnosis not present

## 2021-02-28 DIAGNOSIS — I700 Atherosclerosis of aorta: Secondary | ICD-10-CM | POA: Diagnosis not present

## 2021-02-28 DIAGNOSIS — K219 Gastro-esophageal reflux disease without esophagitis: Secondary | ICD-10-CM | POA: Diagnosis not present

## 2021-02-28 DIAGNOSIS — D701 Agranulocytosis secondary to cancer chemotherapy: Secondary | ICD-10-CM

## 2021-02-28 DIAGNOSIS — R918 Other nonspecific abnormal finding of lung field: Secondary | ICD-10-CM | POA: Diagnosis not present

## 2021-02-28 DIAGNOSIS — R197 Diarrhea, unspecified: Secondary | ICD-10-CM | POA: Diagnosis not present

## 2021-02-28 DIAGNOSIS — Z79899 Other long term (current) drug therapy: Secondary | ICD-10-CM | POA: Diagnosis not present

## 2021-02-28 DIAGNOSIS — Z86711 Personal history of pulmonary embolism: Secondary | ICD-10-CM | POA: Diagnosis not present

## 2021-02-28 DIAGNOSIS — E7800 Pure hypercholesterolemia, unspecified: Secondary | ICD-10-CM | POA: Diagnosis not present

## 2021-02-28 DIAGNOSIS — Z95828 Presence of other vascular implants and grafts: Secondary | ICD-10-CM

## 2021-02-28 LAB — CBC WITH DIFFERENTIAL/PLATELET
Abs Immature Granulocytes: 0.02 10*3/uL (ref 0.00–0.07)
Basophils Absolute: 0 10*3/uL (ref 0.0–0.1)
Basophils Relative: 0 %
Eosinophils Absolute: 0.1 10*3/uL (ref 0.0–0.5)
Eosinophils Relative: 3 %
HCT: 36.9 % (ref 36.0–46.0)
Hemoglobin: 11.9 g/dL — ABNORMAL LOW (ref 12.0–15.0)
Immature Granulocytes: 1 %
Lymphocytes Relative: 33 %
Lymphs Abs: 1.3 10*3/uL (ref 0.7–4.0)
MCH: 30 pg (ref 26.0–34.0)
MCHC: 32.2 g/dL (ref 30.0–36.0)
MCV: 92.9 fL (ref 80.0–100.0)
Monocytes Absolute: 0.5 10*3/uL (ref 0.1–1.0)
Monocytes Relative: 13 %
Neutro Abs: 1.9 10*3/uL (ref 1.7–7.7)
Neutrophils Relative %: 50 %
Platelets: 217 10*3/uL (ref 150–400)
RBC: 3.97 MIL/uL (ref 3.87–5.11)
RDW: 14.5 % (ref 11.5–15.5)
WBC: 3.9 10*3/uL — ABNORMAL LOW (ref 4.0–10.5)
nRBC: 0 % (ref 0.0–0.2)

## 2021-02-28 LAB — COMPREHENSIVE METABOLIC PANEL
ALT: 24 U/L (ref 0–44)
AST: 37 U/L (ref 15–41)
Albumin: 4.2 g/dL (ref 3.5–5.0)
Alkaline Phosphatase: 54 U/L (ref 38–126)
Anion gap: 9 (ref 5–15)
BUN: 11 mg/dL (ref 8–23)
CO2: 27 mmol/L (ref 22–32)
Calcium: 9.4 mg/dL (ref 8.9–10.3)
Chloride: 100 mmol/L (ref 98–111)
Creatinine, Ser: 0.9 mg/dL (ref 0.44–1.00)
GFR, Estimated: >60 mL/min (ref >60)
Glucose, Bld: 97 mg/dL (ref 70–99)
Potassium: 3.7 mmol/L (ref 3.5–5.1)
Sodium: 136 mmol/L (ref 135–145)
Total Bilirubin: 0.7 mg/dL (ref 0.3–1.2)
Total Protein: 7.1 g/dL (ref 6.5–8.1)

## 2021-02-28 LAB — PROTEIN, URINE, RANDOM: Total Protein, Urine: <6 mg/dL

## 2021-02-28 MED ORDER — METHYLPREDNISOLONE SODIUM SUCC 125 MG IJ SOLR
125.0000 mg | Freq: Once | INTRAMUSCULAR | Status: AC
  Administered 2021-02-28: 125 mg via INTRAVENOUS
  Filled 2021-02-28: qty 2

## 2021-02-28 MED ORDER — SODIUM CHLORIDE 0.9 % IV SOLN
10.0000 mg | Freq: Once | INTRAVENOUS | Status: AC
  Administered 2021-02-28: 10 mg via INTRAVENOUS
  Filled 2021-02-28: qty 10

## 2021-02-28 MED ORDER — SODIUM CHLORIDE 0.9 % IV SOLN
5.0000 mg/kg | Freq: Once | INTRAVENOUS | Status: AC
  Administered 2021-02-28: 400 mg via INTRAVENOUS
  Filled 2021-02-28: qty 16

## 2021-02-28 MED ORDER — ATROPINE SULFATE 1 MG/ML IJ SOLN
0.5000 mg | Freq: Once | INTRAMUSCULAR | Status: AC
  Administered 2021-02-28: 0.5 mg via INTRAVENOUS
  Filled 2021-02-28: qty 1

## 2021-02-28 MED ORDER — FLUOROURACIL CHEMO INJECTION 2.5 GM/50ML
400.0000 mg/m2 | Freq: Once | INTRAVENOUS | Status: AC
  Administered 2021-02-28: 750 mg via INTRAVENOUS
  Filled 2021-02-28: qty 15

## 2021-02-28 MED ORDER — HEPARIN SOD (PORK) LOCK FLUSH 100 UNIT/ML IV SOLN
500.0000 [IU] | Freq: Once | INTRAVENOUS | Status: DC
  Filled 2021-02-28: qty 5

## 2021-02-28 MED ORDER — SODIUM CHLORIDE 0.9 % IV SOLN
150.0000 mg/m2 | Freq: Once | INTRAVENOUS | Status: AC
  Administered 2021-02-28: 280 mg via INTRAVENOUS
  Filled 2021-02-28: qty 10

## 2021-02-28 MED ORDER — SODIUM CHLORIDE 0.9 % IV SOLN
Freq: Once | INTRAVENOUS | Status: AC
  Filled 2021-02-28: qty 250

## 2021-02-28 MED ORDER — FAMOTIDINE IN NACL 20-0.9 MG/50ML-% IV SOLN
20.0000 mg | Freq: Once | INTRAVENOUS | Status: AC
Start: 2021-02-28 — End: 2021-02-28
  Administered 2021-02-28: 20 mg via INTRAVENOUS
  Filled 2021-02-28: qty 50

## 2021-02-28 MED ORDER — FAMOTIDINE 20 MG IN NS 100 ML IVPB
20.0000 mg | Freq: Once | INTRAVENOUS | Status: DC
  Filled 2021-02-28: qty 100

## 2021-02-28 MED ORDER — PALONOSETRON HCL INJECTION 0.25 MG/5ML
0.2500 mg | Freq: Once | INTRAVENOUS | Status: AC
  Administered 2021-02-28: 0.25 mg via INTRAVENOUS
  Filled 2021-02-28: qty 5

## 2021-02-28 MED ORDER — DIPHENHYDRAMINE HCL 50 MG/ML IJ SOLN
25.0000 mg | Freq: Once | INTRAMUSCULAR | Status: AC
  Administered 2021-02-28: 25 mg via INTRAVENOUS
  Filled 2021-02-28: qty 1

## 2021-02-28 MED ORDER — SODIUM CHLORIDE 0.9 % IV SOLN
2400.0000 mg/m2 | INTRAVENOUS | Status: DC
  Administered 2021-02-28: 4500 mg via INTRAVENOUS
  Filled 2021-02-28: qty 90

## 2021-02-28 MED ORDER — SODIUM CHLORIDE 0.9 % IV SOLN
750.0000 mg | Freq: Once | INTRAVENOUS | Status: AC
  Administered 2021-02-28: 750 mg via INTRAVENOUS
  Filled 2021-02-28: qty 37.5

## 2021-02-28 MED ORDER — SODIUM CHLORIDE 0.9% FLUSH
10.0000 mL | Freq: Once | INTRAVENOUS | Status: AC
Start: 2021-02-28 — End: 2021-02-28
  Administered 2021-02-28: 10 mL via INTRAVENOUS
  Filled 2021-02-28: qty 10

## 2021-02-28 NOTE — Progress Notes (Signed)
At the end of the last two treatments, pt was noted to have hives. Clarified orders with MD. Per Janeann Merl RN per Dr. Tasia Catchings pt to also receive 25 mg IV benadryl prior to treatment.   1350: pt noted to have hives on upper chest, neck, upper back and scalp. redness noted on bilateral eyelids (primarly right eyelid). Pt denies awareness of hive, itching, or any other symptoms at this time. Pt stable with no s/s of distress noted.   Beckey Rutter NP, Faythe Casa NP, and Dr. Tasia Catchings aware.   1258: lauren Zenia Resides NP at chairside, Per Beckey Rutter NP give 125 mg IV Solumedrol and 20 mg IV Pepcid and proceed with remainder of treatment and okay to d/c if symptoms do not worsen.   1355: Hives continues to improve, but present. Pt continues to deny any other symptoms at this time. VS continue to remain stable.   1403: Pt stable at this. No s/s of distress noted. Hives continue to improve. Home instructions given to pt. Pt educated to call clinic with any questions or report to [email or web address] in the event of an emergency such as but not limited to SOB, throat swelling, etc.   **see MAR for medications given** **see flow sheets for VS** Pt stable at discharge.

## 2021-02-28 NOTE — Progress Notes (Signed)
Hematology/Oncology progress note [facility] [phone] [phone]   Patient Care Team: Karamalegos, Alexander J, DO as PCP - General (Family Medicine) Stanton, Kristi D, RN as Registered Nurse Laylaa Guevarra, MD as Consulting Physician (Hematology and Oncology)  REFERRING PROVIDER: Dr.Secord CHIEF COMPLAINTS/REASON FOR VISIT:  Follow up for rectal cancer  HISTORY OF PRESENTING ILLNESS:  Jacqueline Yoder is a  74 y.o.  female with PMH listed below who was referred to me for evaluation of endometrial cancer.   Patient initially presented with complaints of postmenopausal bleeding on 08/16/2018.  History of was menopausal vaginal bleeding in 2016 which resulted in cervical polypectomy.  Pathology 02/04/2015 showed cervical polyp, consistent with benign endometrial polyp.  [name] after polypectomy due to anxiety associated with pelvic exams.  pelvic exam on 08/16/2018 reviewed cervical abnormality and from enlarged uterus. Seen by Dr. Cherry on 10/29/2018.  Endometrial biopsy and a Pap smear was performed. 10/29/2018 Pap smear showed adenocarcinoma, favor endometrial origin. 10/29/2018 endometrial biopsy showed endometrioid carcinoma, FIGO grade 1.  10/29/2018- TA & TV Ultrasound revealed: Anteverted uterus measuring 8.7 x 5.6 x 6.4 cm without evidence of focal masses.  The endometrium measuring 24.1 mm (thickened) and heterogeneous.  Right and left ovaries not visualized.  No adnexal masses identified.  No free fluid in cul-de-sac.  Patient was seen by Dr. Secord in clinic on 11/13/2018.  Cervical exam reveals 2 cm exophytic irregular mass consistent with malignancy.   11/19/2018 CT chest abdomen pelvis with contrast showed thickened endometrium with some irregularity compatible with the provided diagnosis of endometrial malignancy.  There is a mildly prominent left inguinal node 1.4 cm.  Patient was seen by Dr. Berchuck on 11/20/2018 and left groin  lymph node biopsy was recommended.  11/26/2018 patient underwent left inguinal lymph node biopsy. Pathology showed metastatic adenocarcinoma consistent with colorectal origin.  CDX 2+.  Case was discussed on tumor board.  Recommend colonoscopy for further evaluation.  Patient reports significant weight loss 30 pounds over the last year.  Chronic vaginal spotting. Change of bowel habits the past few months.  More constipated.  Family history positive for brother who has colon cancer prostate cancer.  patient has underwent colonoscopy on 12/03/2018 which reviewed a nonobstructing large mass in the rectum.  Also chronic fistula.  Mass was not circumferential.  This was biopsied with a cold forceps for histology.  Pathology came back hyperplastic polyp negative for dysplasia and malignancy. [name], patient underwent flex sigmoidoscopy on 12/06/2018 with rebiopsy of the rectal mass. This time biopsy results came back positive for invasive colorectal adenocarcinoma, moderately differentiated. Immunotherapy for nearly mismatch repair protein (MMR ) was performed.  There is no loss of MMR expression.  low probability of MSI high.   # Seen by [name] for evaluation of resectability for rectal cancer. In addition, she also had a second opinion with [facility] where her endometrial biopsy pathology was changed to  adenocarcinoma, consistent with colorectal primary.   [name]. She has home health that has been assisting with ostomy care  Patient was also evaluated by [facility] oncology.  Recommendation is to proceed with TNT with concurrent chemoradiation followed by neoadjuvant chemotherapy followed by surgical resection. Patient prefers to have treatment done locally with [facility].   # Oncology Treatment:  02/03/2019- 03/19/2019  concurrent Xeloda and radiation.  Xeloda dose 825mg /m2 BID - rounded to 1650mg BID- on days of  radiation. 04/09/2019, started   on FOLFOX with bolus 5-FU omitted.  07/16/2019 finished 8 cycles of FOLFOX.  # Patient has underwent 09/17/19 APR/posterior vaginectomy/TAH/BSO/VY-flap pT4b pN0 with close vaginal margin 0.2 mm.  Uterus and ovaries negative for malignancy. Patient reports bilateral lower extremity numbness and tingling, intermittent, left worse than right. She has lost a lot of weight since her APR surgery.   Surveillance plan Recommend history and physical/tumor marker monitoring every 3 to 6 months for the first 2 years after surgery and then every 6 months for a total of 5 years. Chest abdomen pelvis every 6 to 12 months for total 5 years. Colonoscopy 1 year after surgery, if no advanced adenoma, repeat in 3 years and then 5 years.  #Family history with half brother having's history of colon cancer prostate cancer.  Personal history of colorectal cancer.  Patient has not decided if she wants genetic testing.   # vaginal introitus mass biopsied. Pathology is consistent with metastatic colorectal adenocarcinoma  # history of PE( 01/13/2020)  in the bilateral lower extremity DVT (01/13/2020).   She finishes 6 months of anticoagulation with Eliquis 5 mg twice daily. Now switched to Eliquis 2.5 mg twice daily..  # She has now developed recurrent disease. I have discussed with [name]  Dr. Hester Mates and the mass is not resectable. Patient has also had colonoscopy by Dr. Vicente Males yesterday. Normal examination. # 07/16/2020 cycle 1 FOLFIRI  # 07/20/2020 PET scan was done for further evaluation, images are consistent with local recurrence, no distant metastasis. #Discussed with radiation oncology Dr. Baruch Gouty will recommends concurrent chemotherapy and radiation. 08/02/2020-08/16/2020, patient starts radiation.  Xeloda was held due to neutropenia 08/17/2020,-09/06/2020 Xeloda 1500 mg twice daily concurrently with radiation INTERVAL HISTORY Jacqueline Yoder is a 75 y.o. female who has above  history reviewed by me presents for follow-up of rectal cancer. #Patient is status post cycle 1 systemic chemotherapy with FOLFIRI/bevacizumab.   Chemotherapy was delayed for the past 2 weeks due to neutropenia.  Denies any diarrhea, fever chills She has no new complaints.    Review of Systems  Constitutional: Negative for appetite change, chills, fatigue, fever and unexpected weight change.  HENT:   Negative for hearing loss and voice change.   Eyes: Negative for eye problems.  Respiratory: Negative for chest tightness and cough.   Cardiovascular: Negative for chest pain.  Gastrointestinal: Negative for abdominal distention, abdominal pain, blood in stool, constipation, diarrhea and nausea.  Endocrine: Negative for hot flashes.  Genitourinary: Negative for difficulty urinating and frequency.   Musculoskeletal: Negative for arthralgias.  Skin: Negative for itching and rash.  Neurological: Negative for extremity weakness and numbness.  Hematological: Negative for adenopathy.  Psychiatric/Behavioral: Negative for confusion.    MEDICAL HISTORY:  Past Medical History:  Diagnosis Date  . Allergy   . Arthritis   . Blood clot in vein   . Family history of colon cancer   . GERD (gastroesophageal reflux disease)   . Hypercholesteremia   . Hypertension   . Hypertension   . Lower extremity edema   . Rectal cancer ([place]) 12/2018  . Urinary incontinence     SURGICAL HISTORY: Past Surgical History:  Procedure Laterality Date  . ABDOMINAL HYSTERECTOMY    . CHOLECYSTECTOMY  1971  . COLONOSCOPY WITH PROPOFOL N/A 12/03/2018   Procedure: COLONOSCOPY WITH PROPOFOL;  Surgeon: Lucilla Lame, MD;  Location: [facility] ENDOSCOPY;  Service: Endoscopy;  Laterality: N/A;  . COLONOSCOPY WITH PROPOFOL N/A 07/15/2020   Procedure: COLONOSCOPY WITH PROPOFOL;  Surgeon: [name],  MD;  Location: [facility] ENDOSCOPY;  Service: Gastroenterology;  Laterality: N/A;  . FLEXIBLE SIGMOIDOSCOPY N/A 12/06/2018   Procedure:  FLEXIBLE SIGMOIDOSCOPY;  Surgeon: [name], MD;  Location: [facility] ENDOSCOPY;  Service: Endoscopy;  Laterality: N/A;  . LAPAROSCOPIC COLOSTOMY  01/06/2019  . PORTACATH PLACEMENT N/A 04/03/2019   Procedure: INSERTION PORT-A-CATH;  Surgeon: Jules Husbands, MD;  Location: [facility] ORS;  Service: General;  Laterality: N/A;    SOCIAL HISTORY: Social History   Socioeconomic History  . Marital status: Married    Spouse name: Not on file  . Number of children: Not on file  . Years of education: Not on file  . Highest education level: Not on file  Occupational History  . Not on file  Tobacco Use  . Smoking status: Former Smoker    Quit date: 12/02/1977    Years since quitting: 43.2  . Smokeless tobacco: Former Network engineer  . Vaping Use: Never used  Substance and Sexual Activity  . Alcohol use: Never  . Drug use: Never  . Sexual activity: Not Currently    Birth control/protection: None  Other Topics Concern  . Not on file  Social History Narrative   Lives with daughter   Social Determinants of Health   Financial Resource Strain: Not on file  Food Insecurity: Not on file  Transportation Needs: Not on file  Physical Activity: Not on file  Stress: Not on file  Social Connections: Not on file  Intimate Partner Violence: Not on file    FAMILY HISTORY: Family History  Problem Relation Age of Onset  . Colon cancer Brother 74       exposure to chemicals [place]  . Hypertension Mother   . Stroke Mother   . Kidney failure Father   . Breast cancer Neg Hx   . Ovarian cancer Neg Hx     ALLERGIES:  is allergic to sulfamethoxazole-trimethoprim.  MEDICATIONS:  Current Outpatient Medications  Medication Sig Dispense Refill  . apixaban (ELIQUIS) 2.5 MG TABS tablet Take 1 tablet (2.5 mg total) by mouth 2 (two) times daily. 60 tablet 5  . Cholecalciferol (VITAMIN D3) 2000 units capsule Take 2,000 Units by mouth daily.    Marland Kitchen docusate sodium (COLACE) 100 MG capsule Take 100 mg by mouth  2 (two) times daily as needed.    . fexofenadine (ALLEGRA) 180 MG tablet Take 180 mg by mouth daily.    . fluticasone (FLONASE) 50 MCG/ACT nasal spray Place 1 spray into both nostrils daily. 16 g 1  . ipratropium (ATROVENT) 0.03 % nasal spray Place 1 spray into both nostrils 2 (two) times daily. 30 mL 2  . lidocaine-prilocaine (EMLA) cream Apply 1 application topically as needed. 30 g 6  . loperamide (IMODIUM) 2 MG capsule Take 1 capsule (2 mg total) by mouth See admin instructions. With onset of loose stool, take $RemoveBef'4mg'mYxNirXpkh$  followed by $RemoveBef'2mg'kfjgmRdKqC$  every 2 hours,  Maximum: 16 mg/day 120 capsule 1  . Multiple Vitamins-Minerals (ONE-A-DAY WOMENS 50 PLUS PO) Take 1 tablet by mouth daily.     . ondansetron (ZOFRAN) 8 MG tablet Take 1 tablet (8 mg total) by mouth 2 (two) times daily as needed for refractory nausea / vomiting. Start on day 3 after chemotherapy. 30 tablet 1  . potassium chloride SA (K-DUR) 20 MEQ tablet Take 20 mEq by mouth daily.     . prochlorperazine (COMPAZINE) 10 MG tablet Take 1 tablet (10 mg total) by mouth every 6 (six) hours as needed (NAUSEA). 30 tablet  1  . simvastatin (ZOCOR) 40 MG tablet Take 40 mg by mouth at bedtime.     . triamterene-hydrochlorothiazide (DYAZIDE) 37.5-25 MG capsule Take 1 capsule by mouth daily.     Marland Kitchen zinc gluconate 50 MG tablet Take 50 mg by mouth daily.    . diclofenac sodium (VOLTAREN) 1 % GEL Apply 2 g topically 4 (four) times daily as needed (joint pain).  (Patient not taking: Reported on 02/28/2021)  11  . gabapentin (NEURONTIN) 100 MG capsule Take 1 capsule (100 mg total) by mouth at bedtime. (Patient not taking: Reported on 02/28/2021) 30 capsule 0   No current facility-administered medications for this visit.   Facility-Administered Medications Ordered in Other Visits  Medication Dose Route Frequency Provider Last Rate Last Admin  . fluorouracil (ADRUCIL) 4,500 mg in sodium chloride 0.9 % 60 mL chemo infusion  2,400 mg/m2 (Treatment Plan Recorded) Intravenous 1  day or 1 dose Earlie Server, MD   4,500 mg at 02/28/21 1403  . heparin lock flush 100 unit/mL  500 Units Intravenous Once Earlie Server, MD         PHYSICAL EXAMINATION: ECOG PERFORMANCE STATUS: 1 - Symptomatic but completely ambulatory  Physical Exam Constitutional:      General: She is not in acute distress. HENT:     Head: Normocephalic and atraumatic.  Eyes:     General: No scleral icterus. Cardiovascular:     Rate and Rhythm: Normal rate and regular rhythm.     Heart sounds: Normal heart sounds.  Pulmonary:     Effort: Pulmonary effort is normal. No respiratory distress.     Breath sounds: No wheezing.  Abdominal:     General: Bowel sounds are normal. There is no distension.     Palpations: Abdomen is soft.     Comments: + Colostomy bag   Musculoskeletal:        General: No deformity. Normal range of motion.     Cervical back: Normal range of motion and neck supple.  Skin:    General: Skin is warm and dry.     Findings: No erythema or rash.  Neurological:     Mental Status: She is alert and oriented to person, place, and time. Mental status is at baseline.     Cranial Nerves: No cranial nerve deficit.     Coordination: Coordination normal.  Psychiatric:        Mood and Affect: Mood normal.    CMP Latest Ref Rng & Units 02/28/2021  Glucose 70 - 99 mg/dL 97  BUN 8 - 23 mg/dL 11  Creatinine 0.44 - 1.00 mg/dL 0.90  Sodium 135 - 145 mmol/L 136  Potassium 3.5 - 5.1 mmol/L 3.7  Chloride 98 - 111 mmol/L 100  CO2 22 - 32 mmol/L 27  Calcium 8.9 - 10.3 mg/dL 9.4  Total Protein 6.5 - 8.1 g/dL 7.1  Total Bilirubin 0.3 - 1.2 mg/dL 0.7  Alkaline Phos 38 - 126 U/L 54  AST 15 - 41 U/L 37  ALT 0 - 44 U/L 24   CBC Latest Ref Rng & Units 02/28/2021  WBC 4.0 - 10.5 K/uL 3.9(L)  Hemoglobin 12.0 - 15.0 g/dL 11.9(L)  Hematocrit 36.0 - 46.0 % 36.9  Platelets 150 - 400 K/uL 217    LABORATORY DATA:  I have reviewed the data as listed Lab Results  Component Value Date   WBC 3.9 (L)  02/28/2021   HGB 11.9 (L) 02/28/2021   HCT 36.9 02/28/2021   MCV 92.9 02/28/2021  PLT 217 02/28/2021   Recent Labs    03/19/20 0826 06/23/20 1124 07/16/20 0802 07/23/20 1345 02/14/21 0843 02/21/21 0835 02/28/21 0913  NA 142 140 140   < > 139 139 136  K 4.1 3.7 3.2*   < > 3.4* 3.7 3.7  CL 102 100 101   < > 101 101 100  CO2 $Re'31 30 29   'Pel$ < > $R'27 27 27  'lI$ GLUCOSE 95 114* 114*   < > 96 104* 97  BUN $Re'12 10 9   'uSF$ < > $R'10 10 11  'IH$ CREATININE 1.01* 0.97 0.87   < > 0.79 0.90 0.90  CALCIUM 9.9 9.1 9.2   < > 9.2 9.3 9.4  GFRNONAA 56* 58* >60   < > >60 >60 >60  GFRAA >60 >60 >60  --   --   --   --   PROT 7.5 7.0 6.9   < > 6.9 6.9 7.1  ALBUMIN 4.4 4.0 3.6   < > 3.8 3.9 4.2  AST $Re'25 25 24   'ifc$ < > 30 36 37  ALT $Re'16 15 14   'yqZ$ < > $R'20 19 24  'Ph$ ALKPHOS 37* 41 41   < > 55 48 54  BILITOT 0.8 0.4 0.5   < > 0.6 0.5 0.7   < > = values in this interval not displayed.   Iron/TIBC/Ferritin/ %Sat    Component Value Date/Time   IRON 40 06/23/2020 1124   TIBC 246 (L) 06/23/2020 1124   FERRITIN 338 (H) 06/23/2020 1124   IRONPCTSAT 16 06/23/2020 1124     RADIOGRAPHIC STUDIES: I have personally reviewed the radiological images as listed and agreed with the findings in the report. CT CHEST ABDOMEN PELVIS W CONTRAST  Result Date: 01/20/2021 CLINICAL DATA:  Restaging rectal cancer diagnosed 03/05/2019. Status post APR with vaginal recurrence. EXAM: CT CHEST, ABDOMEN, AND PELVIS WITH CONTRAST TECHNIQUE: Multidetector CT imaging of the chest, abdomen and pelvis was performed following the standard protocol during bolus administration of intravenous contrast. CONTRAST:  113mL OMNIPAQUE IOHEXOL 300 MG/ML  SOLN COMPARISON:  Multiple priors including PET-CT July 20, 2020 and CT chest abdomen and pelvis October 20, 2020 FINDINGS: CT CHEST FINDINGS Cardiovascular: Right chest wall Port-A-Cath with tip in the right atrium. Atherosclerotic thoracic aortic calcifications. No thoracic aortic aneurysm. No central pulmonary embolus.  Normal size heart. No significant pericardial effusion/thickening. Mediastinum/Nodes: No discrete thyroid nodule. The trachea and esophagus are unremarkable. No pathologically enlarged mediastinal, hilar or axillary lymph nodes. Lungs/Pleura: There are multiple scattered new and/or enlarging bilateral pulmonary nodules. For instance: Increased size of the left upper lobe pulmonary nodule which now measures 4 mm on image 37/4 previously 3 mm. Increased size of a right upper lobe pulmonary nodule which now measures 3 mm on image 47/4 previously 2 mm. Increased size of the a right middle lobe pulmonary nodule which now measures 8 mm previously 6 mm. New 2 mm left upper lobe pulmonary nodule on image 21/4. New 3 mm right upper lobe pulmonary nodule on image 68/4. No pleural effusion.  No pneumothorax.  No focal consolidation. Musculoskeletal: No aggressive lytic or blastic lesions of bone. CT ABDOMEN PELVIS FINDINGS Hepatobiliary: No suspicious hepatic lesions. Gallbladder is surgically absent. No biliary ductal dilation. Pancreas: Within normal limits. Spleen: Within normal limits. Adrenals/Urinary Tract: Bilateral adrenal glands are unremarkable. No hydronephrosis. Bilateral renal cysts. No solid enhancing renal lesions. Urinary bladder is grossly unremarkable for degree of distension Stomach/Bowel: The stomach and small bowel are grossly unremarkable. The  terminal ileum and appendix are both unremarkable. No acute inflammatory process or obstructive findings. Stable surgical changes of APR and left lower quadrant colostomy. There is mild colonic wall thickening involving the distal transverse colon at the splenic flexure which appears similar to prior and may be related to under distension. Colonic diverticulosis without findings of acute diverticulitis. Vascular/Lymphatic: Aortic and branch vessel atherosclerosis. No gastrohepatic or hepatoduodenal ligament lymphadenopathy. No retroperitoneal or mesenteric  lymphadenopathy. No pelvic sidewall lymphadenopathy. New mildly enlarged bilateral inguinal lymph nodes which measures 1.2 cm on the right on image 104/3 and 1.4 cm on the left on image 102/3. Reproductive: Surgically absent Other: Persistent moderate vaginal wall thickening without discrete mass visualized. Musculoskeletal: Multilevel degenerative changes spine. Post radiation change in the pelvis and sacrum. No aggressive lytic or blastic lesion of bone. IMPRESSION: 1. Multiple scattered new and/or enlarging bilateral pulmonary nodules, consistent with pulmonary metastatic disease. 2. Persistent moderate vaginal wall thickening without discrete mass. New mildly enlarged bilateral inguinal lymph nodes, nonspecific but concerning for disease involvement. 3. Mild colonic wall thickening involving the distal transverse colon at the splenic flexure which appears similar to prior and may be related to under distension. Consider attention on follow-up imaging versus colonoscopy. 4. Stable surgical changes of APR and left lower quadrant colostomy. 5. Aortic atherosclerosis. Aortic Atherosclerosis (ICD10-I70.0). Electronically Signed   By: Dahlia Bailiff MD   On: 01/20/2021 11:23   MM 3D SCREEN BREAST BILATERAL  Result Date: 01/12/2021 CLINICAL DATA:  Screening. EXAM: DIGITAL SCREENING BILATERAL MAMMOGRAM WITH TOMOSYNTHESIS AND CAD TECHNIQUE: Bilateral screening digital craniocaudal and mediolateral oblique mammograms were obtained. Bilateral screening digital breast tomosynthesis was performed. The images were evaluated with computer-aided detection. COMPARISON:  Previous exam(s). ACR Breast Density Category b: There are scattered areas of fibroglandular density. FINDINGS: There are no findings suspicious for malignancy. The images were evaluated with computer-aided detection. IMPRESSION: No mammographic evidence of malignancy. A result letter of this screening mammogram will be mailed directly to the patient.  RECOMMENDATION: Screening mammogram in one year. (Code:SM-B-01Y) BI-RADS CATEGORY  1: Negative. Electronically Signed   By: Dorise Bullion III M.D   On: 01/12/2021 16:28    CT CHEST ABDOMEN PELVIS W CONTRAST  Result Date: 01/20/2021 CLINICAL DATA:  Restaging rectal cancer diagnosed 03/05/2019. Status post APR with vaginal recurrence. EXAM: CT CHEST, ABDOMEN, AND PELVIS WITH CONTRAST TECHNIQUE: Multidetector CT imaging of the chest, abdomen and pelvis was performed following the standard protocol during bolus administration of intravenous contrast. CONTRAST:  124mL OMNIPAQUE IOHEXOL 300 MG/ML  SOLN COMPARISON:  Multiple priors including PET-CT July 20, 2020 and CT chest abdomen and pelvis October 20, 2020 FINDINGS: CT CHEST FINDINGS Cardiovascular: Right chest wall Port-A-Cath with tip in the right atrium. Atherosclerotic thoracic aortic calcifications. No thoracic aortic aneurysm. No central pulmonary embolus. Normal size heart. No significant pericardial effusion/thickening. Mediastinum/Nodes: No discrete thyroid nodule. The trachea and esophagus are unremarkable. No pathologically enlarged mediastinal, hilar or axillary lymph nodes. Lungs/Pleura: There are multiple scattered new and/or enlarging bilateral pulmonary nodules. For instance: Increased size of the left upper lobe pulmonary nodule which now measures 4 mm on image 37/4 previously 3 mm. Increased size of a right upper lobe pulmonary nodule which now measures 3 mm on image 47/4 previously 2 mm. Increased size of the a right middle lobe pulmonary nodule which now measures 8 mm previously 6 mm. New 2 mm left upper lobe pulmonary nodule on image 21/4. New 3 mm right upper lobe pulmonary nodule on image 68/4.  No pleural effusion.  No pneumothorax.  No focal consolidation. Musculoskeletal: No aggressive lytic or blastic lesions of bone. CT ABDOMEN PELVIS FINDINGS Hepatobiliary: No suspicious hepatic lesions. Gallbladder is surgically absent. No biliary  ductal dilation. Pancreas: Within normal limits. Spleen: Within normal limits. Adrenals/Urinary Tract: Bilateral adrenal glands are unremarkable. No hydronephrosis. Bilateral renal cysts. No solid enhancing renal lesions. Urinary bladder is grossly unremarkable for degree of distension Stomach/Bowel: The stomach and small bowel are grossly unremarkable. The terminal ileum and appendix are both unremarkable. No acute inflammatory process or obstructive findings. Stable surgical changes of APR and left lower quadrant colostomy. There is mild colonic wall thickening involving the distal transverse colon at the splenic flexure which appears similar to prior and may be related to under distension. Colonic diverticulosis without findings of acute diverticulitis. Vascular/Lymphatic: Aortic and branch vessel atherosclerosis. No gastrohepatic or hepatoduodenal ligament lymphadenopathy. No retroperitoneal or mesenteric lymphadenopathy. No pelvic sidewall lymphadenopathy. New mildly enlarged bilateral inguinal lymph nodes which measures 1.2 cm on the right on image 104/3 and 1.4 cm on the left on image 102/3. Reproductive: Surgically absent Other: Persistent moderate vaginal wall thickening without discrete mass visualized. Musculoskeletal: Multilevel degenerative changes spine. Post radiation change in the pelvis and sacrum. No aggressive lytic or blastic lesion of bone. IMPRESSION: 1. Multiple scattered new and/or enlarging bilateral pulmonary nodules, consistent with pulmonary metastatic disease. 2. Persistent moderate vaginal wall thickening without discrete mass. New mildly enlarged bilateral inguinal lymph nodes, nonspecific but concerning for disease involvement. 3. Mild colonic wall thickening involving the distal transverse colon at the splenic flexure which appears similar to prior and may be related to under distension. Consider attention on follow-up imaging versus colonoscopy. 4. Stable surgical changes of APR and  left lower quadrant colostomy. 5. Aortic atherosclerosis. Aortic Atherosclerosis (ICD10-I70.0). Electronically Signed   By: Dahlia Bailiff MD   On: 01/20/2021 11:23   MM 3D SCREEN BREAST BILATERAL  Result Date: 01/12/2021 CLINICAL DATA:  Screening. EXAM: DIGITAL SCREENING BILATERAL MAMMOGRAM WITH TOMOSYNTHESIS AND CAD TECHNIQUE: Bilateral screening digital craniocaudal and mediolateral oblique mammograms were obtained. Bilateral screening digital breast tomosynthesis was performed. The images were evaluated with computer-aided detection. COMPARISON:  Previous exam(s). ACR Breast Density Category b: There are scattered areas of fibroglandular density. FINDINGS: There are no findings suspicious for malignancy. The images were evaluated with computer-aided detection. IMPRESSION: No mammographic evidence of malignancy. A result letter of this screening mammogram will be mailed directly to the patient. RECOMMENDATION: Screening mammogram in one year. (Code:SM-B-01Y) BI-RADS CATEGORY  1: Negative. Electronically Signed   By: Dorise Bullion III M.D   On: 01/12/2021 16:28    ASSESSMENT & PLAN:  1. Encounter for antineoplastic chemotherapy   2. Chemotherapy induced neutropenia (HCC)   3. Rectal cancer ([place])   4. Chemotherapy induced diarrhea   5. History of DVT (deep vein thrombosis)   6. History of pulmonary embolism   Cancer Staging Rectal cancer [facility]) Staging form: Colon and Rectum, AJCC 8th Edition - Clinical stage from 01/23/2019: Stage IIIC (cT4b, cN1a, cM0) - Signed by Earlie Server, MD on 01/23/2019 - Pathologic stage from 10/06/2019: Stage IIC (ypT4b, pN0, cM0) - Signed by Earlie Server, MD on 10/06/2019 - Pathologic: Stage Unknown (rpTX, pNX, cM1) - Signed by Earlie Server, MD on 01/31/2021  #History of stage IIIC Rectal cancer, s/p TNT, followed by 09/17/19 APR/posterior vaginectomy/TAH/BSO/VY-flap pT4b pN0 with close vaginal margin 0.2 mm.  Uterus and ovaries negative for malignancy. palliative radiation to  vaginal recurrence.  Finished 09/04/2020.  Recurrent metastatic rectal cancer with lung metastasis.-Goals of care, discussed.  Palliative intent. Labs reviewed and discussed with patient .  Proceed with FOLFIRI/bevacizumab chemotherapy- Irinotecan 150 mg/m. Patient will receive Neulasta on day 3.  Recommend patient to start taking Claritin for 4 days.   #Chemotherapy-induced neutropenia, see above #Chemotherapy-induced diarrhea, symptoms are stable.  #Hypokalemia, potassium 3.7, continue potassium chloride 20 mEq daily. #History of pulmonary embolism, history of bilateral DVT continue Eliquis 2.5 mg twice daily.  Follow-up 2 weeks for next cycle of treatment.  Earlie Server, MD, PhD

## 2021-02-28 NOTE — Progress Notes (Signed)
Pt here for follow up. No new concerns voiced.   

## 2021-02-28 NOTE — Patient Instructions (Signed)
[facility]  Discharge Instructions: Thank you for choosing [facility] to provide your oncology and hematology care.  If you have a lab appointment with the Cancer Center, please go directly to the Cancer Center and check in at the registration area.  Wear comfortable clothing and clothing appropriate for easy access to any Portacath or PICC line.   We strive to give you quality time with your provider. You may need to reschedule your appointment if you arrive late (15 or more minutes).  Arriving late affects you and other patients whose appointments are after yours.  Also, if you miss three or more appointments without notifying the office, you may be dismissed from the clinic at the provider's discretion.      For prescription refill requests, have your pharmacy contact our office and allow 72 hours for refills to be completed.    Today you received the following chemotherapy and/or immunotherapy agents: Mvasi, Irinotecan, Leucovorin and Adrucil       To help prevent nausea and vomiting after your treatment, we encourage you to take your nausea medication as directed.  BELOW ARE SYMPTOMS THAT SHOULD BE REPORTED IMMEDIATELY: *FEVER GREATER THAN 100.4 F (38 C) OR HIGHER *CHILLS OR SWEATING *NAUSEA AND VOMITING THAT IS NOT CONTROLLED WITH YOUR NAUSEA MEDICATION *UNUSUAL SHORTNESS OF BREATH *UNUSUAL BRUISING OR BLEEDING *URINARY PROBLEMS (pain or burning when urinating, or frequent urination) *BOWEL PROBLEMS (unusual diarrhea, constipation, pain near the anus) TENDERNESS IN MOUTH AND THROAT WITH OR WITHOUT PRESENCE OF ULCERS (sore throat, sores in mouth, or a toothache) UNUSUAL RASH, SWELLING OR PAIN  UNUSUAL VAGINAL DISCHARGE OR ITCHING   Items with * indicate a potential emergency and should be followed up as soon as possible or go to the Emergency Department if any problems should occur.  Please show the CHEMOTHERAPY ALERT CARD or  IMMUNOTHERAPY ALERT CARD at check-in to the Emergency Department and triage nurse.  Should you have questions after your visit or need to cancel or reschedule your appointment, please contact [facility]  [phone] and follow the prompts.  Office hours are 8:00 a.m. to 4:30 p.m. Monday - Friday. Please note that voicemails left after 4:00 p.m. may not be returned until the following business day.  We are closed weekends and major holidays. You have access to a nurse at all times for urgent questions. Please call the main number to the clinic [phone] and follow the prompts.  For any non-urgent questions, you may also contact your provider using MyChart. We now offer e-Visits for anyone 18 and older to request care online for non-urgent symptoms. For details visit [email or web address].   Also download the MyChart app! Go to the app store, search "MyChart", open the app, select [facility], and log in with your MyChart username and password.  Due to Covid, a mask is required upon entering the hospital/clinic. If you do not have a mask, one will be given to you upon arrival. For doctor visits, patients may have 1 support person aged 18 or older with them. For treatment visits, patients cannot have anyone with them due to current Covid guidelines and our immunocompromised population.  

## 2021-03-01 LAB — CEA: CEA: 2231 ng/mL — ABNORMAL HIGH (ref 0.0–4.7)

## 2021-03-02 ENCOUNTER — Other Ambulatory Visit: Payer: Self-pay

## 2021-03-02 ENCOUNTER — Inpatient Hospital Stay: Payer: Medicare Other

## 2021-03-02 DIAGNOSIS — Z86718 Personal history of other venous thrombosis and embolism: Secondary | ICD-10-CM | POA: Diagnosis not present

## 2021-03-02 DIAGNOSIS — Z87891 Personal history of nicotine dependence: Secondary | ICD-10-CM | POA: Diagnosis not present

## 2021-03-02 DIAGNOSIS — Z5189 Encounter for other specified aftercare: Secondary | ICD-10-CM | POA: Diagnosis not present

## 2021-03-02 DIAGNOSIS — Z5111 Encounter for antineoplastic chemotherapy: Secondary | ICD-10-CM | POA: Diagnosis not present

## 2021-03-02 DIAGNOSIS — K5900 Constipation, unspecified: Secondary | ICD-10-CM | POA: Diagnosis not present

## 2021-03-02 DIAGNOSIS — R634 Abnormal weight loss: Secondary | ICD-10-CM | POA: Diagnosis not present

## 2021-03-02 DIAGNOSIS — C786 Secondary malignant neoplasm of retroperitoneum and peritoneum: Secondary | ICD-10-CM | POA: Diagnosis not present

## 2021-03-02 DIAGNOSIS — E876 Hypokalemia: Secondary | ICD-10-CM | POA: Diagnosis not present

## 2021-03-02 DIAGNOSIS — D702 Other drug-induced agranulocytosis: Secondary | ICD-10-CM | POA: Diagnosis not present

## 2021-03-02 DIAGNOSIS — I700 Atherosclerosis of aorta: Secondary | ICD-10-CM | POA: Diagnosis not present

## 2021-03-02 DIAGNOSIS — R609 Edema, unspecified: Secondary | ICD-10-CM | POA: Diagnosis not present

## 2021-03-02 DIAGNOSIS — Z79899 Other long term (current) drug therapy: Secondary | ICD-10-CM | POA: Diagnosis not present

## 2021-03-02 DIAGNOSIS — K219 Gastro-esophageal reflux disease without esophagitis: Secondary | ICD-10-CM | POA: Diagnosis not present

## 2021-03-02 DIAGNOSIS — C20 Malignant neoplasm of rectum: Secondary | ICD-10-CM | POA: Diagnosis not present

## 2021-03-02 DIAGNOSIS — R197 Diarrhea, unspecified: Secondary | ICD-10-CM | POA: Diagnosis not present

## 2021-03-02 DIAGNOSIS — C799 Secondary malignant neoplasm of unspecified site: Secondary | ICD-10-CM

## 2021-03-02 DIAGNOSIS — Z86711 Personal history of pulmonary embolism: Secondary | ICD-10-CM | POA: Diagnosis not present

## 2021-03-02 DIAGNOSIS — E7800 Pure hypercholesterolemia, unspecified: Secondary | ICD-10-CM | POA: Diagnosis not present

## 2021-03-02 DIAGNOSIS — Z7901 Long term (current) use of anticoagulants: Secondary | ICD-10-CM | POA: Diagnosis not present

## 2021-03-02 DIAGNOSIS — I10 Essential (primary) hypertension: Secondary | ICD-10-CM | POA: Diagnosis not present

## 2021-03-02 DIAGNOSIS — Z923 Personal history of irradiation: Secondary | ICD-10-CM | POA: Diagnosis not present

## 2021-03-02 DIAGNOSIS — R918 Other nonspecific abnormal finding of lung field: Secondary | ICD-10-CM | POA: Diagnosis not present

## 2021-03-02 DIAGNOSIS — T451X5A Adverse effect of antineoplastic and immunosuppressive drugs, initial encounter: Secondary | ICD-10-CM | POA: Diagnosis not present

## 2021-03-02 MED ORDER — HEPARIN SOD (PORK) LOCK FLUSH 100 UNIT/ML IV SOLN
INTRAVENOUS | Status: AC
  Filled 2021-03-02: qty 5

## 2021-03-02 MED ORDER — PEGFILGRASTIM 6 MG/0.6ML ~~LOC~~ PSKT
6.0000 mg | PREFILLED_SYRINGE | Freq: Once | SUBCUTANEOUS | Status: AC
  Administered 2021-03-02: 6 mg via SUBCUTANEOUS
  Filled 2021-03-02: qty 0.6

## 2021-03-02 MED ORDER — HEPARIN SOD (PORK) LOCK FLUSH 100 UNIT/ML IV SOLN
500.0000 [IU] | Freq: Once | INTRAVENOUS | Status: AC | PRN
  Administered 2021-03-02: 500 [IU]
  Filled 2021-03-02: qty 5

## 2021-03-02 MED ORDER — SODIUM CHLORIDE 0.9% FLUSH
10.0000 mL | INTRAVENOUS | Status: DC | PRN
  Administered 2021-03-02: 10 mL
  Filled 2021-03-02: qty 10

## 2021-03-02 NOTE — Patient Instructions (Signed)
[facility]  Discharge Instructions: Thank you for choosing [facility] to provide your oncology and hematology care.  If you have a lab appointment with the [facility], please go directly to the [facility] and check in at the registration area.  Wear comfortable clothing and clothing appropriate for easy access to any Portacath or PICC line.   We strive to give you quality time with your provider. You may need to reschedule your appointment if you arrive late (15 or more minutes).  Arriving late affects you and other patients whose appointments are after yours.  Also, if you miss three or more appointments without notifying the office, you may be dismissed from the clinic at the provider's discretion.      For prescription refill requests, have your pharmacy contact our office and allow 72 hours for refills to be completed.    Today you received the following : Neulasta ONPRO    To help prevent nausea and vomiting after your treatment, we encourage you to take your nausea medication as directed.  BELOW ARE SYMPTOMS THAT SHOULD BE REPORTED IMMEDIATELY: . *FEVER GREATER THAN 100.4 F (38 C) OR HIGHER . *CHILLS OR SWEATING . *NAUSEA AND VOMITING THAT IS NOT CONTROLLED WITH YOUR NAUSEA MEDICATION . *UNUSUAL SHORTNESS OF BREATH . *UNUSUAL BRUISING OR BLEEDING . *URINARY PROBLEMS (pain or burning when urinating, or frequent urination) . *BOWEL PROBLEMS (unusual diarrhea, constipation, pain near the anus) . TENDERNESS IN MOUTH AND THROAT WITH OR WITHOUT PRESENCE OF ULCERS (sore throat, sores in mouth, or a toothache) . UNUSUAL RASH, SWELLING OR PAIN  . UNUSUAL VAGINAL DISCHARGE OR ITCHING   Items with * indicate a potential emergency and should be followed up as soon as possible or go to the Emergency Department if any problems should occur.  Please show the CHEMOTHERAPY ALERT CARD or IMMUNOTHERAPY ALERT CARD at check-in to the Emergency  Department and triage nurse.  Should you have questions after your visit or need to cancel or reschedule your appointment, please contact [facility]  [phone] and follow the prompts.  Office hours are 8:00 a.m. to 4:30 p.m. Monday - Friday. Please note that voicemails left after 4:00 p.m. may not be returned until the following business day.  We are closed weekends and major holidays. You have access to a nurse at all times for urgent questions. Please call the main number to the clinic [phone] and follow the prompts.  For any non-urgent questions, you may also contact your provider using MyChart. We now offer e-Visits for anyone 74 and older to request care online for non-urgent symptoms. For details visit [email or web address].   Also download the MyChart app! Go to the app store, search "MyChart", open the app, select [facility], and log in with your MyChart username and password.  Due to Covid, a mask is required upon entering the hospital/clinic. If you do not have a mask, one will be given to you upon arrival. For doctor visits, patients may have 1 support person aged 34 or older with them. For treatment visits, patients cannot have anyone with them due to current Covid guidelines and our immunocompromised population.

## 2021-03-15 ENCOUNTER — Inpatient Hospital Stay: Payer: Medicare Other

## 2021-03-15 ENCOUNTER — Encounter: Payer: Self-pay | Admitting: Oncology

## 2021-03-15 ENCOUNTER — Other Ambulatory Visit: Payer: Self-pay

## 2021-03-15 ENCOUNTER — Inpatient Hospital Stay (HOSPITAL_BASED_OUTPATIENT_CLINIC_OR_DEPARTMENT_OTHER): Payer: Medicare Other | Admitting: Oncology

## 2021-03-15 VITALS — BP 122/77 | HR 74 | Temp 97.7°F | Resp 16 | Wt 174.8 lb

## 2021-03-15 DIAGNOSIS — D701 Agranulocytosis secondary to cancer chemotherapy: Secondary | ICD-10-CM

## 2021-03-15 DIAGNOSIS — Z5111 Encounter for antineoplastic chemotherapy: Secondary | ICD-10-CM

## 2021-03-15 DIAGNOSIS — C20 Malignant neoplasm of rectum: Secondary | ICD-10-CM | POA: Diagnosis not present

## 2021-03-15 DIAGNOSIS — C799 Secondary malignant neoplasm of unspecified site: Secondary | ICD-10-CM | POA: Diagnosis not present

## 2021-03-15 DIAGNOSIS — E7800 Pure hypercholesterolemia, unspecified: Secondary | ICD-10-CM | POA: Diagnosis not present

## 2021-03-15 DIAGNOSIS — C786 Secondary malignant neoplasm of retroperitoneum and peritoneum: Secondary | ICD-10-CM | POA: Diagnosis not present

## 2021-03-15 DIAGNOSIS — R609 Edema, unspecified: Secondary | ICD-10-CM | POA: Diagnosis not present

## 2021-03-15 DIAGNOSIS — Z86718 Personal history of other venous thrombosis and embolism: Secondary | ICD-10-CM

## 2021-03-15 DIAGNOSIS — E876 Hypokalemia: Secondary | ICD-10-CM

## 2021-03-15 DIAGNOSIS — T451X5A Adverse effect of antineoplastic and immunosuppressive drugs, initial encounter: Secondary | ICD-10-CM | POA: Diagnosis not present

## 2021-03-15 DIAGNOSIS — K521 Toxic gastroenteritis and colitis: Secondary | ICD-10-CM

## 2021-03-15 DIAGNOSIS — R197 Diarrhea, unspecified: Secondary | ICD-10-CM | POA: Diagnosis not present

## 2021-03-15 DIAGNOSIS — K5900 Constipation, unspecified: Secondary | ICD-10-CM | POA: Diagnosis not present

## 2021-03-15 DIAGNOSIS — R918 Other nonspecific abnormal finding of lung field: Secondary | ICD-10-CM | POA: Diagnosis not present

## 2021-03-15 DIAGNOSIS — Z5189 Encounter for other specified aftercare: Secondary | ICD-10-CM | POA: Diagnosis not present

## 2021-03-15 DIAGNOSIS — Z87891 Personal history of nicotine dependence: Secondary | ICD-10-CM | POA: Diagnosis not present

## 2021-03-15 DIAGNOSIS — I10 Essential (primary) hypertension: Secondary | ICD-10-CM | POA: Diagnosis not present

## 2021-03-15 DIAGNOSIS — Z79899 Other long term (current) drug therapy: Secondary | ICD-10-CM | POA: Diagnosis not present

## 2021-03-15 DIAGNOSIS — Z86711 Personal history of pulmonary embolism: Secondary | ICD-10-CM | POA: Diagnosis not present

## 2021-03-15 DIAGNOSIS — Z923 Personal history of irradiation: Secondary | ICD-10-CM | POA: Diagnosis not present

## 2021-03-15 DIAGNOSIS — Z7901 Long term (current) use of anticoagulants: Secondary | ICD-10-CM | POA: Diagnosis not present

## 2021-03-15 DIAGNOSIS — D702 Other drug-induced agranulocytosis: Secondary | ICD-10-CM | POA: Diagnosis not present

## 2021-03-15 DIAGNOSIS — K219 Gastro-esophageal reflux disease without esophagitis: Secondary | ICD-10-CM | POA: Diagnosis not present

## 2021-03-15 DIAGNOSIS — I700 Atherosclerosis of aorta: Secondary | ICD-10-CM | POA: Diagnosis not present

## 2021-03-15 DIAGNOSIS — R634 Abnormal weight loss: Secondary | ICD-10-CM | POA: Diagnosis not present

## 2021-03-15 LAB — CBC WITH DIFFERENTIAL/PLATELET
Abs Immature Granulocytes: 0.5 10*3/uL — ABNORMAL HIGH (ref 0.00–0.07)
Basophils Absolute: 0.1 10*3/uL (ref 0.0–0.1)
Basophils Relative: 1 %
Eosinophils Absolute: 0.1 10*3/uL (ref 0.0–0.5)
Eosinophils Relative: 1 %
HCT: 35.6 % — ABNORMAL LOW (ref 36.0–46.0)
Hemoglobin: 11.7 g/dL — ABNORMAL LOW (ref 12.0–15.0)
Immature Granulocytes: 8 %
Lymphocytes Relative: 26 %
Lymphs Abs: 1.7 10*3/uL (ref 0.7–4.0)
MCH: 30.5 pg (ref 26.0–34.0)
MCHC: 32.9 g/dL (ref 30.0–36.0)
MCV: 93 fL (ref 80.0–100.0)
Monocytes Absolute: 0.5 10*3/uL (ref 0.1–1.0)
Monocytes Relative: 8 %
Neutro Abs: 3.7 10*3/uL (ref 1.7–7.7)
Neutrophils Relative %: 56 %
Platelets: 146 10*3/uL — ABNORMAL LOW (ref 150–400)
RBC: 3.83 MIL/uL — ABNORMAL LOW (ref 3.87–5.11)
RDW: 16 % — ABNORMAL HIGH (ref 11.5–15.5)
WBC: 6.6 10*3/uL (ref 4.0–10.5)
nRBC: 0.8 % — ABNORMAL HIGH (ref 0.0–0.2)

## 2021-03-15 LAB — COMPREHENSIVE METABOLIC PANEL
ALT: 27 U/L (ref 0–44)
AST: 41 U/L (ref 15–41)
Albumin: 4.1 g/dL (ref 3.5–5.0)
Alkaline Phosphatase: 65 U/L (ref 38–126)
Anion gap: 10 (ref 5–15)
BUN: 11 mg/dL (ref 8–23)
CO2: 27 mmol/L (ref 22–32)
Calcium: 9.5 mg/dL (ref 8.9–10.3)
Chloride: 101 mmol/L (ref 98–111)
Creatinine, Ser: 0.8 mg/dL (ref 0.44–1.00)
GFR, Estimated: >60 mL/min (ref >60)
Glucose, Bld: 107 mg/dL — ABNORMAL HIGH (ref 70–99)
Potassium: 3.8 mmol/L (ref 3.5–5.1)
Sodium: 138 mmol/L (ref 135–145)
Total Bilirubin: 0.4 mg/dL (ref 0.3–1.2)
Total Protein: 7.1 g/dL (ref 6.5–8.1)

## 2021-03-15 LAB — PROTEIN, URINE, RANDOM: Total Protein, Urine: <6 mg/dL

## 2021-03-15 MED ORDER — SODIUM CHLORIDE 0.9 % IV SOLN
399.0000 mg/m2 | Freq: Once | INTRAVENOUS | Status: AC
  Administered 2021-03-15: 750 mg via INTRAVENOUS
  Filled 2021-03-15: qty 25

## 2021-03-15 MED ORDER — SODIUM CHLORIDE 0.9 % IV SOLN
150.0000 mg/m2 | Freq: Once | INTRAVENOUS | Status: AC
  Administered 2021-03-15: 280 mg via INTRAVENOUS
  Filled 2021-03-15: qty 10

## 2021-03-15 MED ORDER — PALONOSETRON HCL INJECTION 0.25 MG/5ML
0.2500 mg | Freq: Once | INTRAVENOUS | Status: AC
Start: 2021-03-15 — End: 2021-03-15
  Administered 2021-03-15: 0.25 mg via INTRAVENOUS
  Filled 2021-03-15: qty 5

## 2021-03-15 MED ORDER — FLUOROURACIL CHEMO INJECTION 2.5 GM/50ML
400.0000 mg/m2 | Freq: Once | INTRAVENOUS | Status: AC
  Administered 2021-03-15: 750 mg via INTRAVENOUS
  Filled 2021-03-15: qty 15

## 2021-03-15 MED ORDER — SODIUM CHLORIDE 0.9 % IV SOLN
5.0000 mg/kg | Freq: Once | INTRAVENOUS | Status: AC
  Administered 2021-03-15: 400 mg via INTRAVENOUS
  Filled 2021-03-15: qty 16

## 2021-03-15 MED ORDER — SODIUM CHLORIDE 0.9% FLUSH
10.0000 mL | INTRAVENOUS | Status: DC | PRN
  Administered 2021-03-15: 10 mL via INTRAVENOUS
  Filled 2021-03-15: qty 10

## 2021-03-15 MED ORDER — HEPARIN SOD (PORK) LOCK FLUSH 100 UNIT/ML IV SOLN
500.0000 [IU] | Freq: Once | INTRAVENOUS | Status: DC
  Filled 2021-03-15: qty 5

## 2021-03-15 MED ORDER — SODIUM CHLORIDE 0.9 % IV SOLN
2400.0000 mg/m2 | INTRAVENOUS | Status: DC
  Administered 2021-03-15: 4500 mg via INTRAVENOUS
  Filled 2021-03-15: qty 90

## 2021-03-15 MED ORDER — SODIUM CHLORIDE 0.9 % IV SOLN
Freq: Once | INTRAVENOUS | Status: AC
Start: 2021-03-15 — End: 2021-03-15
  Filled 2021-03-15: qty 250

## 2021-03-15 MED ORDER — SODIUM CHLORIDE 0.9 % IV SOLN
10.0000 mg | Freq: Once | INTRAVENOUS | Status: AC
  Administered 2021-03-15: 10 mg via INTRAVENOUS
  Filled 2021-03-15: qty 10

## 2021-03-15 MED ORDER — ATROPINE SULFATE 1 MG/ML IJ SOLN
0.5000 mg | Freq: Once | INTRAMUSCULAR | Status: AC
  Administered 2021-03-15: 0.5 mg via INTRAVENOUS
  Filled 2021-03-15: qty 1

## 2021-03-15 MED ORDER — DIPHENHYDRAMINE HCL 50 MG/ML IJ SOLN
25.0000 mg | Freq: Once | INTRAMUSCULAR | Status: AC
  Administered 2021-03-15: 25 mg via INTRAVENOUS
  Filled 2021-03-15: qty 1

## 2021-03-15 NOTE — Patient Instructions (Signed)
[facility]  Discharge Instructions: Thank you for choosing [facility] to provide your oncology and hematology care.  If you have a lab appointment with the [facility], please go directly to the [facility] and check in at the registration area.  Wear comfortable clothing and clothing appropriate for easy access to any Portacath or PICC line.   We strive to give you quality time with your provider. You may need to reschedule your appointment if you arrive late (15 or more minutes).  Arriving late affects you and other patients whose appointments are after yours.  Also, if you miss three or more appointments without notifying the office, you may be dismissed from the clinic at the provider's discretion.      For prescription refill requests, have your pharmacy contact our office and allow 72 hours for refills to be completed.    Today you received the following chemotherapy and/or immunotherapy agents: Bevacizumab, Irinotecan, Leucovorin, 5FU       To help prevent nausea and vomiting after your treatment, we encourage you to take your nausea medication as directed.  BELOW ARE SYMPTOMS THAT SHOULD BE REPORTED IMMEDIATELY: . *FEVER GREATER THAN 100.4 F (38 C) OR HIGHER . *CHILLS OR SWEATING . *NAUSEA AND VOMITING THAT IS NOT CONTROLLED WITH YOUR NAUSEA MEDICATION . *UNUSUAL SHORTNESS OF BREATH . *UNUSUAL BRUISING OR BLEEDING . *URINARY PROBLEMS (pain or burning when urinating, or frequent urination) . *BOWEL PROBLEMS (unusual diarrhea, constipation, pain near the anus) . TENDERNESS IN MOUTH AND THROAT WITH OR WITHOUT PRESENCE OF ULCERS (sore throat, sores in mouth, or a toothache) . UNUSUAL RASH, SWELLING OR PAIN  . UNUSUAL VAGINAL DISCHARGE OR ITCHING   Items with * indicate a potential emergency and should be followed up as soon as possible or go to the Emergency Department if any problems should occur.  Please show the CHEMOTHERAPY  ALERT CARD or IMMUNOTHERAPY ALERT CARD at check-in to the Emergency Department and triage nurse.  Should you have questions after your visit or need to cancel or reschedule your appointment, please contact [facility]  [phone] and follow the prompts.  Office hours are 8:00 a.m. to 4:30 p.m. Monday - Friday. Please note that voicemails left after 4:00 p.m. may not be returned until the following business day.  We are closed weekends and major holidays. You have access to a nurse at all times for urgent questions. Please call the main number to the clinic [phone] and follow the prompts.  For any non-urgent questions, you may also contact your provider using MyChart. We now offer e-Visits for anyone 74 and older to request care online for non-urgent symptoms. For details visit [email or web address].   Also download the MyChart app! Go to the app store, search "MyChart", open the app, select [facility], and log in with your MyChart username and password.  Due to Covid, a mask is required upon entering the hospital/clinic. If you do not have a mask, one will be given to you upon arrival. For doctor visits, patients may have 1 support person aged 23 or older with them. For treatment visits, patients cannot have anyone with them due to current Covid guidelines and our immunocompromised population.

## 2021-03-15 NOTE — Progress Notes (Signed)
Hematology/Oncology progress note [facility] [phone] [phone]   Patient Care Team: Karamalegos, Alexander J, DO as PCP - General (Family Medicine) Stanton, Kristi D, RN as Registered Nurse Arinze Rivadeneira, MD as Consulting Physician (Hematology and Oncology)  REFERRING PROVIDER: Dr.Secord CHIEF COMPLAINTS/REASON FOR VISIT:  Follow up for rectal cancer  HISTORY OF PRESENTING ILLNESS:  Jacqueline Yoder is a  74 y.o.  female with PMH listed below who was referred to me for evaluation of endometrial cancer.   Patient initially presented with complaints of postmenopausal bleeding on 08/16/2018.  History of was menopausal vaginal bleeding in 2016 which resulted in cervical polypectomy.  Pathology 02/04/2015 showed cervical polyp, consistent with benign endometrial polyp.  [name] after polypectomy due to anxiety associated with pelvic exams.  pelvic exam on 08/16/2018 reviewed cervical abnormality and from enlarged uterus. Seen by Dr. Cherry on 10/29/2018.  Endometrial biopsy and a Pap smear was performed. 10/29/2018 Pap smear showed adenocarcinoma, favor endometrial origin. 10/29/2018 endometrial biopsy showed endometrioid carcinoma, FIGO grade 1.  10/29/2018- TA & TV Ultrasound revealed: Anteverted uterus measuring 8.7 x 5.6 x 6.4 cm without evidence of focal masses.  The endometrium measuring 24.1 mm (thickened) and heterogeneous.  Right and left ovaries not visualized.  No adnexal masses identified.  No free fluid in cul-de-sac.  Patient was seen by Dr. Secord in clinic on 11/13/2018.  Cervical exam reveals 2 cm exophytic irregular mass consistent with malignancy.   11/19/2018 CT chest abdomen pelvis with contrast showed thickened endometrium with some irregularity compatible with the provided diagnosis of endometrial malignancy.  There is a mildly prominent left inguinal node 1.4 cm.  Patient was seen by Dr. Berchuck on 11/20/2018 and left groin  lymph node biopsy was recommended.  11/26/2018 patient underwent left inguinal lymph node biopsy. Pathology showed metastatic adenocarcinoma consistent with colorectal origin.  CDX 2+.  Case was discussed on tumor board.  Recommend colonoscopy for further evaluation.  Patient reports significant weight loss 30 pounds over the last year.  Chronic vaginal spotting. Change of bowel habits the past few months.  More constipated.  Family history positive for brother who has colon cancer prostate cancer.  patient has underwent colonoscopy on 12/03/2018 which reviewed a nonobstructing large mass in the rectum.  Also chronic fistula.  Mass was not circumferential.  This was biopsied with a cold forceps for histology.  Pathology came back hyperplastic polyp negative for dysplasia and malignancy. [name], patient underwent flex sigmoidoscopy on 12/06/2018 with rebiopsy of the rectal mass. This time biopsy results came back positive for invasive colorectal adenocarcinoma, moderately differentiated. Immunotherapy for nearly mismatch repair protein (MMR ) was performed.  There is no loss of MMR expression.  low probability of MSI high.   # Seen by [name] for evaluation of resectability for rectal cancer. In addition, she also had a second opinion with [facility] where her endometrial biopsy pathology was changed to  adenocarcinoma, consistent with colorectal primary.   [name]. She has home health that has been assisting with ostomy care  Patient was also evaluated by [facility] oncology.  Recommendation is to proceed with TNT with concurrent chemoradiation followed by neoadjuvant chemotherapy followed by surgical resection. Patient prefers to have treatment done locally with [facility].   # Oncology Treatment:  02/03/2019- 03/19/2019  concurrent Xeloda and radiation.  Xeloda dose 825mg /m2 BID - rounded to 1650mg BID- on days of  radiation. 04/09/2019, started   on FOLFOX with bolus 5-FU omitted.  07/16/2019 finished 8 cycles of FOLFOX.  # Patient has underwent 09/17/19 APR/posterior vaginectomy/TAH/BSO/VY-flap pT4b pN0 with close vaginal margin 0.2 mm.  Uterus and ovaries negative for malignancy. Patient reports bilateral lower extremity numbness and tingling, intermittent, left worse than right. She has lost a lot of weight since her APR surgery.   Surveillance plan Recommend history and physical/tumor marker monitoring every 3 to 6 months for the first 2 years after surgery and then every 6 months for a total of 5 years. Chest abdomen pelvis every 6 to 12 months for total 5 years. Colonoscopy 1 year after surgery, if no advanced adenoma, repeat in 3 years and then 5 years.  #Family history with half brother having's history of colon cancer prostate cancer.  Personal history of colorectal cancer.  Patient has not decided if she wants genetic testing.   # vaginal introitus mass biopsied. Pathology is consistent with metastatic colorectal adenocarcinoma  # history of PE( 01/13/2020)  in the bilateral lower extremity DVT (01/13/2020).   She finishes 6 months of anticoagulation with Eliquis 5 mg twice daily. Now switched to Eliquis 2.5 mg twice daily..  # She has now developed recurrent disease. I have discussed with [name]  Dr. Hester Mates and the mass is not resectable. Patient has also had colonoscopy by Dr. Vicente Males yesterday. Normal examination. # 07/16/2020 cycle 1 FOLFIRI  # 07/20/2020 PET scan was done for further evaluation, images are consistent with local recurrence, no distant metastasis. #Discussed with radiation oncology Dr. Baruch Gouty will recommends concurrent chemotherapy and radiation. 08/02/2020-08/16/2020, patient starts radiation.  Xeloda was held due to neutropenia 08/17/2020,-09/06/2020 Xeloda 1500 mg twice daily concurrently with radiation  01/31/21 cycle 1 FOLFIRI + Bev 02/28/2021 Cycle 2 FOLFIRI + Bev-  Chemotherapy was delayed for the past 2 weeks due to neutropenia.  INTERVAL HISTORY Jacqueline Yoder is a 75 y.o. female who has above history reviewed by me presents for follow-up of rectal cancer. She tolerated last chemotherapy well 2 weeks ago. No significnant diarrhea episodes until this morning she had large amount of loose stool and needs to change her colostomy bag in the clinic. She has also felt gassy since last night. She attributes to eating a bowl of cereal with banana. No nausea vomiting, abdominal pain, fever or chills.  Husband is admitted to ICU and she is very stressed and worried.    Review of Systems  Constitutional: Negative for appetite change, chills, fatigue, fever and unexpected weight change.  HENT:   Negative for hearing loss and voice change.   Eyes: Negative for eye problems.  Respiratory: Negative for chest tightness and cough.   Cardiovascular: Negative for chest pain.  Gastrointestinal: Negative for abdominal distention, abdominal pain, blood in stool, constipation, diarrhea and nausea.  Endocrine: Negative for hot flashes.  Genitourinary: Negative for difficulty urinating and frequency.   Musculoskeletal: Negative for arthralgias.  Skin: Negative for itching and rash.  Neurological: Negative for extremity weakness and numbness.  Hematological: Negative for adenopathy.  Psychiatric/Behavioral: Negative for confusion.    MEDICAL HISTORY:  Past Medical History:  Diagnosis Date  . Allergy   . Arthritis   . Blood clot in vein   . Family history of colon cancer   . GERD (gastroesophageal reflux disease)   . Hypercholesteremia   . Hypertension   . Hypertension   . Lower extremity edema   . Rectal cancer ([place]) 12/2018  . Urinary incontinence     SURGICAL HISTORY: Past  Surgical History:  Procedure Laterality Date  . ABDOMINAL HYSTERECTOMY    . CHOLECYSTECTOMY  1971  . COLONOSCOPY WITH PROPOFOL N/A 12/03/2018   Procedure: COLONOSCOPY WITH PROPOFOL;   Surgeon: Lucilla Lame, MD;  Location: [facility] ENDOSCOPY;  Service: Endoscopy;  Laterality: N/A;  . COLONOSCOPY WITH PROPOFOL N/A 07/15/2020   Procedure: COLONOSCOPY WITH PROPOFOL;  Surgeon: [name], MD;  Location: [facility] ENDOSCOPY;  Service: Gastroenterology;  Laterality: N/A;  . FLEXIBLE SIGMOIDOSCOPY N/A 12/06/2018   Procedure: FLEXIBLE SIGMOIDOSCOPY;  Surgeon: [name], MD;  Location: [facility] ENDOSCOPY;  Service: Endoscopy;  Laterality: N/A;  . LAPAROSCOPIC COLOSTOMY  01/06/2019  . PORTACATH PLACEMENT N/A 04/03/2019   Procedure: INSERTION PORT-A-CATH;  Surgeon: Jules Husbands, MD;  Location: [facility] ORS;  Service: General;  Laterality: N/A;    SOCIAL HISTORY: Social History   Socioeconomic History  . Marital status: Married    Spouse name: Not on file  . Number of children: Not on file  . Years of education: Not on file  . Highest education level: Not on file  Occupational History  . Not on file  Tobacco Use  . Smoking status: Former Smoker    Quit date: 12/02/1977    Years since quitting: 43.3  . Smokeless tobacco: Former Network engineer  . Vaping Use: Never used  Substance and Sexual Activity  . Alcohol use: Never  . Drug use: Never  . Sexual activity: Not Currently    Birth control/protection: None  Other Topics Concern  . Not on file  Social History Narrative   Lives with daughter   Social Determinants of Health   Financial Resource Strain: Not on file  Food Insecurity: Not on file  Transportation Needs: Not on file  Physical Activity: Not on file  Stress: Not on file  Social Connections: Not on file  Intimate Partner Violence: Not on file    FAMILY HISTORY: Family History  Problem Relation Age of Onset  . Colon cancer Brother 74       exposure to chemicals [place]  . Hypertension Mother   . Stroke Mother   . Kidney failure Father   . Breast cancer Neg Hx   . Ovarian cancer Neg Hx     ALLERGIES:  is allergic to  sulfamethoxazole-trimethoprim.  MEDICATIONS:  Current Outpatient Medications  Medication Sig Dispense Refill  . apixaban (ELIQUIS) 2.5 MG TABS tablet Take 1 tablet (2.5 mg total) by mouth 2 (two) times daily. 60 tablet 5  . Cholecalciferol (VITAMIN D3) 2000 units capsule Take 2,000 Units by mouth daily.    . diclofenac sodium (VOLTAREN) 1 % GEL Apply 2 g topically 4 (four) times daily as needed (joint pain).  (Patient not taking: Reported on 02/28/2021)  11  . docusate sodium (COLACE) 100 MG capsule Take 100 mg by mouth 2 (two) times daily as needed.    . fexofenadine (ALLEGRA) 180 MG tablet Take 180 mg by mouth daily.    . fluticasone (FLONASE) 50 MCG/ACT nasal spray Place 1 spray into both nostrils daily. 16 g 1  . gabapentin (NEURONTIN) 100 MG capsule Take 1 capsule (100 mg total) by mouth at bedtime. (Patient not taking: Reported on 02/28/2021) 30 capsule 0  . ipratropium (ATROVENT) 0.03 % nasal spray Place 1 spray into both nostrils 2 (two) times daily. 30 mL 2  . lidocaine-prilocaine (EMLA) cream Apply 1 application topically as needed. 30 g 6  . loperamide (IMODIUM) 2 MG capsule Take 1 capsule (2 mg total) by mouth See  admin instructions. With onset of loose stool, take 13m followed by 228mevery 2 hours,  Maximum: 16 mg/day 120 capsule 1  . Multiple Vitamins-Minerals (ONE-A-DAY WOMENS 50 PLUS PO) Take 1 tablet by mouth daily.     . ondansetron (ZOFRAN) 8 MG tablet Take 1 tablet (8 mg total) by mouth 2 (two) times daily as needed for refractory nausea / vomiting. Start on day 3 after chemotherapy. 30 tablet 1  . potassium chloride SA (K-DUR) 20 MEQ tablet Take 20 mEq by mouth daily.     . prochlorperazine (COMPAZINE) 10 MG tablet Take 1 tablet (10 mg total) by mouth every 6 (six) hours as needed (NAUSEA). 30 tablet 1  . simvastatin (ZOCOR) 40 MG tablet Take 40 mg by mouth at bedtime.     . triamterene-hydrochlorothiazide (DYAZIDE) 37.5-25 MG capsule Take 1 capsule by mouth daily.     . Marland Kitchen[employer]  gluconate 50 MG tablet Take 50 mg by mouth daily.     No current facility-administered medications for this visit.     PHYSICAL EXAMINATION: ECOG PERFORMANCE STATUS: 1 - Symptomatic but completely ambulatory  Physical Exam Constitutional:      General: She is not in acute distress. HENT:     Head: Normocephalic and atraumatic.  Eyes:     General: No scleral icterus. Cardiovascular:     Rate and Rhythm: Normal rate and regular rhythm.     Heart sounds: Normal heart sounds.  Pulmonary:     Effort: Pulmonary effort is normal. No respiratory distress.     Breath sounds: No wheezing.  Abdominal:     General: Bowel sounds are normal. There is no distension.     Palpations: Abdomen is soft.     Comments: + Colostomy bag   Musculoskeletal:        General: No deformity. Normal range of motion.     Cervical back: Normal range of motion and neck supple.  Skin:    General: Skin is warm and dry.     Findings: No erythema or rash.  Neurological:     Mental Status: She is alert and oriented to person, place, and time. Mental status is at baseline.     Cranial Nerves: No cranial nerve deficit.     Coordination: Coordination normal.  Psychiatric:     Comments: tearful    CMP Latest Ref Rng & Units 02/28/2021  Glucose 70 - 99 mg/dL 97  BUN 8 - 23 mg/dL 11  Creatinine 0.44 - 1.00 mg/dL 0.90  Sodium 135 - 145 mmol/L 136  Potassium 3.5 - 5.1 mmol/L 3.7  Chloride 98 - 111 mmol/L 100  CO2 22 - 32 mmol/L 27  Calcium 8.9 - 10.3 mg/dL 9.4  Total Protein 6.5 - 8.1 g/dL 7.1  Total Bilirubin 0.3 - 1.2 mg/dL 0.7  Alkaline Phos 38 - 126 U/L 54  AST 15 - 41 U/L 37  ALT 0 - 44 U/L 24   CBC Latest Ref Rng & Units 02/28/2021  WBC 4.0 - 10.5 K/uL 3.9(L)  Hemoglobin 12.0 - 15.0 g/dL 11.9(L)  Hematocrit 36.0 - 46.0 % 36.9  Platelets 150 - 400 K/uL 217    LABORATORY DATA:  I have reviewed the data as listed Lab Results  Component Value Date   WBC 3.9 (L) 02/28/2021   HGB 11.9 (L) 02/28/2021    HCT 36.9 02/28/2021   MCV 92.9 02/28/2021   PLT 217 02/28/2021   Recent Labs    03/19/20 0826 06/23/20 1124 07/16/20 0802  07/23/20 1345 02/14/21 0843 02/21/21 0835 02/28/21 0913  NA 142 140 140   < > 139 139 136  K 4.1 3.7 3.2*   < > 3.4* 3.7 3.7  CL 102 100 101   < > 101 101 100  CO2 _0 < > _1 GLUCOSE 95 114* 114*   < > 96 104* 97  BUN _2 < > _3 CREATININE 1.01* 0.97 0.87   < > 0.79 0.90 0.90  CALCIUM 9.9 9.1 9.2   < > 9.2 9.3 9.4  GFRNONAA 56* 58* >60   < > >60 >60 >60  GFRAA >60 >60 >60  --   --   --   --   PROT 7.5 7.0 6.9   < > 6.9 6.9 7.1  ALBUMIN 4.4 4.0 3.6   < > 3.8 3.9 4.2  AST _4 < > 30 36 37  ALT _5 < > _6 ALKPHOS 37* 41 41   < > 55 48 54  BILITOT 0.8 0.4 0.5   < > 0.6 0.5 0.7   < > = values in this interval not displayed.   Iron/TIBC/Ferritin/ %Sat    Component Value Date/Time   IRON 40 06/23/2020 1124   TIBC 246 (L) 06/23/2020 1124   FERRITIN 338 (H) 06/23/2020 1124   IRONPCTSAT 16 06/23/2020 1124     RADIOGRAPHIC STUDIES: I have personally reviewed the radiological images as listed and agreed with the findings in the report. CT CHEST ABDOMEN PELVIS W CONTRAST  Result Date: 01/20/2021 CLINICAL DATA:  Restaging rectal cancer diagnosed 03/05/2019. Status post APR with vaginal recurrence. EXAM: CT CHEST, ABDOMEN, AND PELVIS WITH CONTRAST TECHNIQUE: Multidetector CT imaging of the chest, abdomen and pelvis was performed following the standard protocol during bolus administration of intravenous contrast. CONTRAST:  147m OMNIPAQUE IOHEXOL 300 MG/ML  SOLN COMPARISON:  Multiple priors including PET-CT July 20, 2020 and CT chest abdomen and pelvis October 20, 2020 FINDINGS: CT CHEST FINDINGS Cardiovascular: Right chest wall Port-A-Cath with tip in the right atrium. Atherosclerotic thoracic aortic calcifications. No thoracic aortic aneurysm. No central pulmonary embolus. Normal size heart. No significant  pericardial effusion/thickening. Mediastinum/Nodes: No discrete thyroid nodule. The trachea and esophagus are unremarkable. No pathologically enlarged mediastinal, hilar or axillary lymph nodes. Lungs/Pleura: There are multiple scattered new and/or enlarging bilateral pulmonary nodules. For instance: Increased size of the left upper lobe pulmonary nodule which now measures 4 mm on image 37/4 previously 3 mm. Increased size of a right upper lobe pulmonary nodule which now measures 3 mm on image 47/4 previously 2 mm. Increased size of the a right middle lobe pulmonary nodule which now measures 8 mm previously 6 mm. New 2 mm left upper lobe pulmonary nodule on image 21/4. New 3 mm right upper lobe pulmonary nodule on image 68/4. No pleural effusion.  No pneumothorax.  No focal consolidation. Musculoskeletal: No aggressive lytic or blastic lesions of bone. CT ABDOMEN PELVIS FINDINGS Hepatobiliary: No suspicious hepatic lesions. Gallbladder is surgically absent. No biliary ductal dilation. Pancreas: Within normal limits. Spleen: Within normal limits. Adrenals/Urinary Tract: Bilateral adrenal glands are unremarkable. No hydronephrosis. Bilateral renal cysts. No solid enhancing renal lesions. Urinary bladder is grossly unremarkable for degree of distension Stomach/Bowel: The stomach and small bowel are grossly unremarkable. The terminal ileum and appendix are both unremarkable. No acute inflammatory process or obstructive findings. Stable surgical  changes of APR and left lower quadrant colostomy. There is mild colonic wall thickening involving the distal transverse colon at the splenic flexure which appears similar to prior and may be related to under distension. Colonic diverticulosis without findings of acute diverticulitis. Vascular/Lymphatic: Aortic and branch vessel atherosclerosis. No gastrohepatic or hepatoduodenal ligament lymphadenopathy. No retroperitoneal or mesenteric lymphadenopathy. No pelvic sidewall  lymphadenopathy. New mildly enlarged bilateral inguinal lymph nodes which measures 1.2 cm on the right on image 104/3 and 1.4 cm on the left on image 102/3. Reproductive: Surgically absent Other: Persistent moderate vaginal wall thickening without discrete mass visualized. Musculoskeletal: Multilevel degenerative changes spine. Post radiation change in the pelvis and sacrum. No aggressive lytic or blastic lesion of bone. IMPRESSION: 1. Multiple scattered new and/or enlarging bilateral pulmonary nodules, consistent with pulmonary metastatic disease. 2. Persistent moderate vaginal wall thickening without discrete mass. New mildly enlarged bilateral inguinal lymph nodes, nonspecific but concerning for disease involvement. 3. Mild colonic wall thickening involving the distal transverse colon at the splenic flexure which appears similar to prior and may be related to under distension. Consider attention on follow-up imaging versus colonoscopy. 4. Stable surgical changes of APR and left lower quadrant colostomy. 5. Aortic atherosclerosis. Aortic Atherosclerosis (ICD10-I70.0). Electronically Signed   By: Dahlia Bailiff MD   On: 01/20/2021 11:23   MM 3D SCREEN BREAST BILATERAL  Result Date: 01/12/2021 CLINICAL DATA:  Screening. EXAM: DIGITAL SCREENING BILATERAL MAMMOGRAM WITH TOMOSYNTHESIS AND CAD TECHNIQUE: Bilateral screening digital craniocaudal and mediolateral oblique mammograms were obtained. Bilateral screening digital breast tomosynthesis was performed. The images were evaluated with computer-aided detection. COMPARISON:  Previous exam(s). ACR Breast Density Category b: There are scattered areas of fibroglandular density. FINDINGS: There are no findings suspicious for malignancy. The images were evaluated with computer-aided detection. IMPRESSION: No mammographic evidence of malignancy. A result letter of this screening mammogram will be mailed directly to the patient. RECOMMENDATION: Screening mammogram in one  year. (Code:SM-B-01Y) BI-RADS CATEGORY  1: Negative. Electronically Signed   By: Dorise Bullion III M.D   On: 01/12/2021 16:28    CT CHEST ABDOMEN PELVIS W CONTRAST  Result Date: 01/20/2021 CLINICAL DATA:  Restaging rectal cancer diagnosed 03/05/2019. Status post APR with vaginal recurrence. EXAM: CT CHEST, ABDOMEN, AND PELVIS WITH CONTRAST TECHNIQUE: Multidetector CT imaging of the chest, abdomen and pelvis was performed following the standard protocol during bolus administration of intravenous contrast. CONTRAST:  132m OMNIPAQUE IOHEXOL 300 MG/ML  SOLN COMPARISON:  Multiple priors including PET-CT July 20, 2020 and CT chest abdomen and pelvis October 20, 2020 FINDINGS: CT CHEST FINDINGS Cardiovascular: Right chest wall Port-A-Cath with tip in the right atrium. Atherosclerotic thoracic aortic calcifications. No thoracic aortic aneurysm. No central pulmonary embolus. Normal size heart. No significant pericardial effusion/thickening. Mediastinum/Nodes: No discrete thyroid nodule. The trachea and esophagus are unremarkable. No pathologically enlarged mediastinal, hilar or axillary lymph nodes. Lungs/Pleura: There are multiple scattered new and/or enlarging bilateral pulmonary nodules. For instance: Increased size of the left upper lobe pulmonary nodule which now measures 4 mm on image 37/4 previously 3 mm. Increased size of a right upper lobe pulmonary nodule which now measures 3 mm on image 47/4 previously 2 mm. Increased size of the a right middle lobe pulmonary nodule which now measures 8 mm previously 6 mm. New 2 mm left upper lobe pulmonary nodule on image 21/4. New 3 mm right upper lobe pulmonary nodule on image 68/4. No pleural effusion.  No pneumothorax.  No focal consolidation. Musculoskeletal: No aggressive lytic or blastic  lesions of bone. CT ABDOMEN PELVIS FINDINGS Hepatobiliary: No suspicious hepatic lesions. Gallbladder is surgically absent. No biliary ductal dilation. Pancreas: Within normal  limits. Spleen: Within normal limits. Adrenals/Urinary Tract: Bilateral adrenal glands are unremarkable. No hydronephrosis. Bilateral renal cysts. No solid enhancing renal lesions. Urinary bladder is grossly unremarkable for degree of distension Stomach/Bowel: The stomach and small bowel are grossly unremarkable. The terminal ileum and appendix are both unremarkable. No acute inflammatory process or obstructive findings. Stable surgical changes of APR and left lower quadrant colostomy. There is mild colonic wall thickening involving the distal transverse colon at the splenic flexure which appears similar to prior and may be related to under distension. Colonic diverticulosis without findings of acute diverticulitis. Vascular/Lymphatic: Aortic and branch vessel atherosclerosis. No gastrohepatic or hepatoduodenal ligament lymphadenopathy. No retroperitoneal or mesenteric lymphadenopathy. No pelvic sidewall lymphadenopathy. New mildly enlarged bilateral inguinal lymph nodes which measures 1.2 cm on the right on image 104/3 and 1.4 cm on the left on image 102/3. Reproductive: Surgically absent Other: Persistent moderate vaginal wall thickening without discrete mass visualized. Musculoskeletal: Multilevel degenerative changes spine. Post radiation change in the pelvis and sacrum. No aggressive lytic or blastic lesion of bone. IMPRESSION: 1. Multiple scattered new and/or enlarging bilateral pulmonary nodules, consistent with pulmonary metastatic disease. 2. Persistent moderate vaginal wall thickening without discrete mass. New mildly enlarged bilateral inguinal lymph nodes, nonspecific but concerning for disease involvement. 3. Mild colonic wall thickening involving the distal transverse colon at the splenic flexure which appears similar to prior and may be related to under distension. Consider attention on follow-up imaging versus colonoscopy. 4. Stable surgical changes of APR and left lower quadrant colostomy. 5. Aortic  atherosclerosis. Aortic Atherosclerosis (ICD10-I70.0). Electronically Signed   By: Dahlia Bailiff MD   On: 01/20/2021 11:23   MM 3D SCREEN BREAST BILATERAL  Result Date: 01/12/2021 CLINICAL DATA:  Screening. EXAM: DIGITAL SCREENING BILATERAL MAMMOGRAM WITH TOMOSYNTHESIS AND CAD TECHNIQUE: Bilateral screening digital craniocaudal and mediolateral oblique mammograms were obtained. Bilateral screening digital breast tomosynthesis was performed. The images were evaluated with computer-aided detection. COMPARISON:  Previous exam(s). ACR Breast Density Category b: There are scattered areas of fibroglandular density. FINDINGS: There are no findings suspicious for malignancy. The images were evaluated with computer-aided detection. IMPRESSION: No mammographic evidence of malignancy. A result letter of this screening mammogram will be mailed directly to the patient. RECOMMENDATION: Screening mammogram in one year. (Code:[id number]) BI-RADS CATEGORY  1: Negative. Electronically Signed   By: Dorise Bullion III M.D   On: 01/12/2021 16:28    ASSESSMENT & PLAN:  1. Encounter for antineoplastic chemotherapy   2. Rectal cancer ([facility])   3. Chemotherapy induced neutropenia ([facility])   4. Chemotherapy induced diarrhea   5. History of DVT (deep vein thrombosis)   6. History of pulmonary embolism   7. Hypokalemia   Cancer Staging Rectal cancer [facility]) Staging form: Colon and Rectum, AJCC 8th Edition - Clinical stage from 01/23/2019: Stage IIIC (cT4b, cN1a, cM0) - Signed by Earlie Server, MD on 01/23/2019 - Pathologic stage from 10/06/2019: Stage IIC (ypT4b, pN0, cM0) - Signed by Earlie Server, MD on 10/06/2019 - Pathologic: Stage Unknown (rpTX, pNX, cM1) - Signed by Earlie Server, MD on 01/31/2021  #History of stage IIIC Rectal cancer, s/p TNT, followed by 09/17/19 APR/posterior vaginectomy/TAH/BSO/VY-flap, pT4b pN0 with close vaginal margin 0.2 mm.  Uterus and ovaries negative for malignancy. palliative radiation to vaginal recurrence.   Finished 09/04/2020 01/19/21 Recurrent metastatic rectal cancer with lung metastasis.-Goals of care, discussed.  Palliative intent. Labs are reviewed and discussed with patient. Proceed with FOLFIRI/bevacizumab chemotherapy- Irinotecan 150 mg/m. Patient will receive Neulasta on day 3.  take Claritin for 4 days.   #Chemotherapy-induced neutropenia, see above #Chemotherapy-induced diarrhea, symptoms are stable. Today's loose stool episode was not typical, probabaly food related. Recommend her to monitor symptoms. Take imodium per instruction.  #Hypokalemia, potassium 3.8, continue potassium chloride 20 mEq daily. #History of pulmonary embolism, history of bilateral DVTcontinue  Eliquis 2.5 mg twice daily.  Follow-up 2 weeks for next cycle of treatment.  Earlie Server, MD, PhD

## 2021-03-17 ENCOUNTER — Inpatient Hospital Stay: Payer: Medicare Other | Attending: Oncology

## 2021-03-17 VITALS — BP 119/68 | HR 71 | Resp 17

## 2021-03-17 DIAGNOSIS — C7982 Secondary malignant neoplasm of genital organs: Secondary | ICD-10-CM | POA: Insufficient documentation

## 2021-03-17 DIAGNOSIS — Z86718 Personal history of other venous thrombosis and embolism: Secondary | ICD-10-CM | POA: Insufficient documentation

## 2021-03-17 DIAGNOSIS — Z79899 Other long term (current) drug therapy: Secondary | ICD-10-CM | POA: Diagnosis not present

## 2021-03-17 DIAGNOSIS — I10 Essential (primary) hypertension: Secondary | ICD-10-CM | POA: Diagnosis not present

## 2021-03-17 DIAGNOSIS — Z5189 Encounter for other specified aftercare: Secondary | ICD-10-CM | POA: Diagnosis not present

## 2021-03-17 DIAGNOSIS — C20 Malignant neoplasm of rectum: Secondary | ICD-10-CM | POA: Insufficient documentation

## 2021-03-17 DIAGNOSIS — K219 Gastro-esophageal reflux disease without esophagitis: Secondary | ICD-10-CM | POA: Diagnosis not present

## 2021-03-17 DIAGNOSIS — Z86711 Personal history of pulmonary embolism: Secondary | ICD-10-CM | POA: Diagnosis not present

## 2021-03-17 DIAGNOSIS — E7800 Pure hypercholesterolemia, unspecified: Secondary | ICD-10-CM | POA: Insufficient documentation

## 2021-03-17 DIAGNOSIS — C799 Secondary malignant neoplasm of unspecified site: Secondary | ICD-10-CM

## 2021-03-17 DIAGNOSIS — Z7901 Long term (current) use of anticoagulants: Secondary | ICD-10-CM | POA: Insufficient documentation

## 2021-03-17 DIAGNOSIS — Z5111 Encounter for antineoplastic chemotherapy: Secondary | ICD-10-CM | POA: Diagnosis not present

## 2021-03-17 MED ORDER — HEPARIN SOD (PORK) LOCK FLUSH 100 UNIT/ML IV SOLN
INTRAVENOUS | Status: AC
  Filled 2021-03-17: qty 5

## 2021-03-17 MED ORDER — HEPARIN SOD (PORK) LOCK FLUSH 100 UNIT/ML IV SOLN
500.0000 [IU] | Freq: Once | INTRAVENOUS | Status: AC | PRN
  Administered 2021-03-17: 500 [IU]
  Filled 2021-03-17: qty 5

## 2021-03-17 MED ORDER — SODIUM CHLORIDE 0.9% FLUSH
10.0000 mL | INTRAVENOUS | Status: DC | PRN
  Administered 2021-03-17: 10 mL
  Filled 2021-03-17: qty 10

## 2021-03-17 MED ORDER — PEGFILGRASTIM 6 MG/0.6ML ~~LOC~~ PSKT
6.0000 mg | PREFILLED_SYRINGE | Freq: Once | SUBCUTANEOUS | Status: AC
  Administered 2021-03-17: 6 mg via SUBCUTANEOUS
  Filled 2021-03-17: qty 0.6

## 2021-03-17 NOTE — Patient Instructions (Signed)
Pegfilgrastim injection What is this medicine? PEGFILGRASTIM (PEG fil gra stim) is a long-acting granulocyte colony-stimulating factor that stimulates the growth of neutrophils, a type of white blood cell important in the body's fight against infection. It is used to reduce the incidence of fever and infection in patients with certain types of cancer who are receiving chemotherapy that affects the bone marrow, and to increase survival after being exposed to high doses of radiation. This medicine may be used for other purposes; ask your health care provider or pharmacist if you have questions. COMMON BRAND NAME(S): Fulphila, Neulasta, Nyvepria, UDENYCA, Ziextenzo What should I tell my health care provider before I take this medicine? They need to know if you have any of these conditions:  kidney disease  latex allergy  ongoing radiation therapy  sickle cell disease  skin reactions to acrylic adhesives (On-Body Injector only)  an unusual or allergic reaction to pegfilgrastim, filgrastim, other medicines, foods, dyes, or preservatives  pregnant or trying to get pregnant  breast-feeding How should I use this medicine? This medicine is for injection under the skin. If you get this medicine at home, you will be taught how to prepare and give the pre-filled syringe or how to use the On-body Injector. Refer to the patient Instructions for Use for detailed instructions. Use exactly as directed. Tell your healthcare provider immediately if you suspect that the On-body Injector may not have performed as intended or if you suspect the use of the On-body Injector resulted in a missed or partial dose. It is important that you put your used needles and syringes in a special sharps container. Do not put them in a trash can. If you do not have a sharps container, call your pharmacist or healthcare provider to get one. Talk to your pediatrician regarding the use of this medicine in children. While this drug  may be prescribed for selected conditions, precautions do apply. Overdosage: If you think you have taken too much of this medicine contact a poison control center or emergency room at once. NOTE: This medicine is only for you. Do not share this medicine with others. What if I miss a dose? It is important not to miss your dose. Call your doctor or health care professional if you miss your dose. If you miss a dose due to an On-body Injector failure or leakage, a new dose should be administered as soon as possible using a single prefilled syringe for manual use. What may interact with this medicine? Interactions have not been studied. This list may not describe all possible interactions. Give your health care provider a list of all the medicines, herbs, non-prescription drugs, or dietary supplements you use. Also tell them if you smoke, drink alcohol, or use illegal drugs. Some items may interact with your medicine. What should I watch for while using this medicine? Your condition will be monitored carefully while you are receiving this medicine. You may need blood work done while you are taking this medicine. Talk to your health care provider about your risk of cancer. You may be more at risk for certain types of cancer if you take this medicine. If you are going to need a MRI, CT scan, or other procedure, tell your doctor that you are using this medicine (On-Body Injector only). What side effects may I notice from receiving this medicine? Side effects that you should report to your doctor or health care professional as soon as possible:  allergic reactions (skin rash, itching or hives, swelling of   the face, lips, or tongue)  back pain  dizziness  fever  pain, redness, or irritation at site where injected  pinpoint red spots on the skin  red or dark-brown urine  shortness of breath or breathing problems  stomach or side pain, or pain at the shoulder  swelling  tiredness  trouble  passing urine or change in the amount of urine  unusual bruising or bleeding Side effects that usually do not require medical attention (report to your doctor or health care professional if they continue or are bothersome):  bone pain  muscle pain This list may not describe all possible side effects. Call your doctor for medical advice about side effects. You may report side effects to FDA at 1-800-FDA-1088. Where should I keep my medicine? Keep out of the reach of children. If you are using this medicine at home, you will be instructed on how to store it. Throw away any unused medicine after the expiration date on the label. NOTE: This sheet is a summary. It may not cover all possible information. If you have questions about this medicine, talk to your doctor, pharmacist, or health care provider.  2021 Elsevier/Gold Standard (2019-10-24 13:20:51)  

## 2021-03-24 ENCOUNTER — Ambulatory Visit: Payer: Medicare Other | Admitting: Podiatry

## 2021-03-24 DIAGNOSIS — Z933 Colostomy status: Secondary | ICD-10-CM | POA: Diagnosis not present

## 2021-03-29 ENCOUNTER — Other Ambulatory Visit: Payer: Self-pay

## 2021-03-29 ENCOUNTER — Inpatient Hospital Stay (HOSPITAL_BASED_OUTPATIENT_CLINIC_OR_DEPARTMENT_OTHER): Payer: Medicare Other | Admitting: Oncology

## 2021-03-29 ENCOUNTER — Inpatient Hospital Stay: Payer: Medicare Other

## 2021-03-29 ENCOUNTER — Encounter: Payer: Self-pay | Admitting: Oncology

## 2021-03-29 VITALS — BP 115/73 | HR 71 | Temp 96.2°F | Resp 18 | Wt 168.9 lb

## 2021-03-29 VITALS — BP 130/51 | HR 56 | Temp 96.1°F | Resp 18

## 2021-03-29 DIAGNOSIS — I825Z3 Chronic embolism and thrombosis of unspecified deep veins of distal lower extremity, bilateral: Secondary | ICD-10-CM | POA: Diagnosis not present

## 2021-03-29 DIAGNOSIS — T451X5A Adverse effect of antineoplastic and immunosuppressive drugs, initial encounter: Secondary | ICD-10-CM

## 2021-03-29 DIAGNOSIS — Z5111 Encounter for antineoplastic chemotherapy: Secondary | ICD-10-CM | POA: Diagnosis not present

## 2021-03-29 DIAGNOSIS — I10 Essential (primary) hypertension: Secondary | ICD-10-CM | POA: Diagnosis not present

## 2021-03-29 DIAGNOSIS — Z7901 Long term (current) use of anticoagulants: Secondary | ICD-10-CM | POA: Diagnosis not present

## 2021-03-29 DIAGNOSIS — C799 Secondary malignant neoplasm of unspecified site: Secondary | ICD-10-CM | POA: Diagnosis not present

## 2021-03-29 DIAGNOSIS — C7982 Secondary malignant neoplasm of genital organs: Secondary | ICD-10-CM | POA: Diagnosis not present

## 2021-03-29 DIAGNOSIS — C20 Malignant neoplasm of rectum: Secondary | ICD-10-CM | POA: Diagnosis not present

## 2021-03-29 DIAGNOSIS — F4321 Adjustment disorder with depressed mood: Secondary | ICD-10-CM

## 2021-03-29 DIAGNOSIS — K219 Gastro-esophageal reflux disease without esophagitis: Secondary | ICD-10-CM | POA: Diagnosis not present

## 2021-03-29 DIAGNOSIS — Z79899 Other long term (current) drug therapy: Secondary | ICD-10-CM | POA: Diagnosis not present

## 2021-03-29 DIAGNOSIS — Z86711 Personal history of pulmonary embolism: Secondary | ICD-10-CM

## 2021-03-29 DIAGNOSIS — E7800 Pure hypercholesterolemia, unspecified: Secondary | ICD-10-CM | POA: Diagnosis not present

## 2021-03-29 DIAGNOSIS — Z86718 Personal history of other venous thrombosis and embolism: Secondary | ICD-10-CM

## 2021-03-29 DIAGNOSIS — Z5189 Encounter for other specified aftercare: Secondary | ICD-10-CM | POA: Diagnosis not present

## 2021-03-29 DIAGNOSIS — K521 Toxic gastroenteritis and colitis: Secondary | ICD-10-CM

## 2021-03-29 DIAGNOSIS — D701 Agranulocytosis secondary to cancer chemotherapy: Secondary | ICD-10-CM | POA: Diagnosis not present

## 2021-03-29 LAB — COMPREHENSIVE METABOLIC PANEL
ALT: 15 U/L (ref 0–44)
AST: 27 U/L (ref 15–41)
Albumin: 4.3 g/dL (ref 3.5–5.0)
Alkaline Phosphatase: 71 U/L (ref 38–126)
Anion gap: 10 (ref 5–15)
BUN: 8 mg/dL (ref 8–23)
CO2: 28 mmol/L (ref 22–32)
Calcium: 9.4 mg/dL (ref 8.9–10.3)
Chloride: 99 mmol/L (ref 98–111)
Creatinine, Ser: 0.98 mg/dL (ref 0.44–1.00)
GFR, Estimated: >60 mL/min (ref >60)
Glucose, Bld: 102 mg/dL — ABNORMAL HIGH (ref 70–99)
Potassium: 3.5 mmol/L (ref 3.5–5.1)
Sodium: 137 mmol/L (ref 135–145)
Total Bilirubin: 0.6 mg/dL (ref 0.3–1.2)
Total Protein: 7.3 g/dL (ref 6.5–8.1)

## 2021-03-29 LAB — PROTEIN, URINE, RANDOM: Total Protein, Urine: 11 mg/dL

## 2021-03-29 LAB — CBC WITH DIFFERENTIAL/PLATELET
Abs Immature Granulocytes: 0.26 10*3/uL — ABNORMAL HIGH (ref 0.00–0.07)
Basophils Absolute: 0 10*3/uL (ref 0.0–0.1)
Basophils Relative: 1 %
Eosinophils Absolute: 0.1 10*3/uL (ref 0.0–0.5)
Eosinophils Relative: 1 %
HCT: 37.6 % (ref 36.0–46.0)
Hemoglobin: 12.5 g/dL (ref 12.0–15.0)
Immature Granulocytes: 5 %
Lymphocytes Relative: 31 %
Lymphs Abs: 1.7 10*3/uL (ref 0.7–4.0)
MCH: 31.1 pg (ref 26.0–34.0)
MCHC: 33.2 g/dL (ref 30.0–36.0)
MCV: 93.5 fL (ref 80.0–100.0)
Monocytes Absolute: 0.5 10*3/uL (ref 0.1–1.0)
Monocytes Relative: 10 %
Neutro Abs: 2.8 10*3/uL (ref 1.7–7.7)
Neutrophils Relative %: 52 %
Platelets: 199 10*3/uL (ref 150–400)
RBC: 4.02 MIL/uL (ref 3.87–5.11)
RDW: 16.6 % — ABNORMAL HIGH (ref 11.5–15.5)
WBC: 5.3 10*3/uL (ref 4.0–10.5)
nRBC: 0.8 % — ABNORMAL HIGH (ref 0.0–0.2)

## 2021-03-29 MED ORDER — POTASSIUM CHLORIDE CRYS ER 20 MEQ PO TBCR: 20.0000 meq | EXTENDED_RELEASE_TABLET | Freq: Every day | ORAL | 0 refills | Status: DC

## 2021-03-29 MED ORDER — SODIUM CHLORIDE 0.9 % IV SOLN
Freq: Once | INTRAVENOUS | Status: AC
  Filled 2021-03-29: qty 250

## 2021-03-29 MED ORDER — DIPHENHYDRAMINE HCL 50 MG/ML IJ SOLN
25.0000 mg | Freq: Once | INTRAMUSCULAR | Status: AC
Start: 2021-03-29 — End: 2021-03-29
  Administered 2021-03-29: 25 mg via INTRAVENOUS
  Filled 2021-03-29: qty 1

## 2021-03-29 MED ORDER — ATROPINE SULFATE 1 MG/ML IJ SOLN
0.5000 mg | Freq: Once | INTRAMUSCULAR | Status: AC
  Administered 2021-03-29: 0.5 mg via INTRAVENOUS
  Filled 2021-03-29: qty 1

## 2021-03-29 MED ORDER — SODIUM CHLORIDE 0.9 % IV SOLN
150.0000 mg/m2 | Freq: Once | INTRAVENOUS | Status: AC
  Administered 2021-03-29: 280 mg via INTRAVENOUS
  Filled 2021-03-29: qty 10

## 2021-03-29 MED ORDER — SODIUM CHLORIDE 0.9 % IV SOLN
2400.0000 mg/m2 | INTRAVENOUS | Status: DC
  Administered 2021-03-29: 4500 mg via INTRAVENOUS
  Filled 2021-03-29: qty 90

## 2021-03-29 MED ORDER — SODIUM CHLORIDE 0.9 % IV SOLN
399.0000 mg/m2 | Freq: Once | INTRAVENOUS | Status: AC
  Administered 2021-03-29: 750 mg via INTRAVENOUS
  Filled 2021-03-29: qty 25

## 2021-03-29 MED ORDER — SODIUM CHLORIDE 0.9 % IV SOLN
5.0000 mg/kg | Freq: Once | INTRAVENOUS | Status: AC
  Administered 2021-03-29: 400 mg via INTRAVENOUS
  Filled 2021-03-29: qty 16

## 2021-03-29 MED ORDER — FLUOROURACIL CHEMO INJECTION 2.5 GM/50ML
400.0000 mg/m2 | Freq: Once | INTRAVENOUS | Status: AC
  Administered 2021-03-29: 750 mg via INTRAVENOUS
  Filled 2021-03-29: qty 15

## 2021-03-29 MED ORDER — PALONOSETRON HCL INJECTION 0.25 MG/5ML
0.2500 mg | Freq: Once | INTRAVENOUS | Status: AC
Start: 2021-03-29 — End: 2021-03-29
  Administered 2021-03-29: 0.25 mg via INTRAVENOUS
  Filled 2021-03-29: qty 5

## 2021-03-29 MED ORDER — DEXAMETHASONE SODIUM PHOSPHATE 100 MG/10ML IJ SOLN
10.0000 mg | Freq: Once | INTRAMUSCULAR | Status: AC
  Administered 2021-03-29: 10 mg via INTRAVENOUS
  Filled 2021-03-29: qty 10

## 2021-03-29 MED ORDER — SODIUM CHLORIDE 0.9% FLUSH
10.0000 mL | INTRAVENOUS | Status: DC | PRN
  Administered 2021-03-29: 10 mL via INTRAVENOUS
  Filled 2021-03-29: qty 10

## 2021-03-29 NOTE — Progress Notes (Signed)
Patient here for follow up. Pt reports that she recently lost her husband and she has appetite has not been very well since all this.

## 2021-03-29 NOTE — Patient Instructions (Signed)
[facility]  Discharge Instructions: Thank you for choosing [facility] to provide your oncology and hematology care.  If you have a lab appointment with the [facility], please go directly to the [facility] and check in at the registration area.  Wear comfortable clothing and clothing appropriate for easy access to any Portacath or PICC line.   We strive to give you quality time with your provider. You may need to reschedule your appointment if you arrive late (15 or more minutes).  Arriving late affects you and other patients whose appointments are after yours.  Also, if you miss three or more appointments without notifying the office, you may be dismissed from the clinic at the provider's discretion.      For prescription refill requests, have your pharmacy contact our office and allow 72 hours for refills to be completed.    Today you received the following chemotherapy and/or immunotherapy agents: Mvasi, Irinotecan, Leucovorin and Adrucil       To help prevent nausea and vomiting after your treatment, we encourage you to take your nausea medication as directed.  BELOW ARE SYMPTOMS THAT SHOULD BE REPORTED IMMEDIATELY: *FEVER GREATER THAN 100.4 F (38 C) OR HIGHER *CHILLS OR SWEATING *NAUSEA AND VOMITING THAT IS NOT CONTROLLED WITH YOUR NAUSEA MEDICATION *UNUSUAL SHORTNESS OF BREATH *UNUSUAL BRUISING OR BLEEDING *URINARY PROBLEMS (pain or burning when urinating, or frequent urination) *BOWEL PROBLEMS (unusual diarrhea, constipation, pain near the anus) TENDERNESS IN MOUTH AND THROAT WITH OR WITHOUT PRESENCE OF ULCERS (sore throat, sores in mouth, or a toothache) UNUSUAL RASH, SWELLING OR PAIN  UNUSUAL VAGINAL DISCHARGE OR ITCHING   Items with * indicate a potential emergency and should be followed up as soon as possible or go to the Emergency Department if any problems should occur.  Please show the CHEMOTHERAPY ALERT CARD or  IMMUNOTHERAPY ALERT CARD at check-in to the Emergency Department and triage nurse.  Should you have questions after your visit or need to cancel or reschedule your appointment, please contact [facility]  [phone] and follow the prompts.  Office hours are 8:00 a.m. to 4:30 p.m. Monday - Friday. Please note that voicemails left after 4:00 p.m. may not be returned until the following business day.  We are closed weekends and major holidays. You have access to a nurse at all times for urgent questions. Please call the main number to the clinic [phone] and follow the prompts.  For any non-urgent questions, you may also contact your provider using MyChart. We now offer e-Visits for anyone 15 and older to request care online for non-urgent symptoms. For details visit [email or web address].   Also download the MyChart app! Go to the app store, search "MyChart", open the app, select [facility], and log in with your MyChart username and password.  Due to Covid, a mask is required upon entering the hospital/clinic. If you do not have a mask, one will be given to you upon arrival. For doctor visits, patients may have 1 support person aged 68 or older with them. For treatment visits, patients cannot have anyone with them due to current Covid guidelines and our immunocompromised population.

## 2021-03-29 NOTE — Progress Notes (Addendum)
Hematology/Oncology progress note [facility] [phone] [phone]   Patient Care Team: Karamalegos, Alexander J, DO as PCP - General (Family Medicine) Stanton, Kristi D, RN as Registered Nurse Bodie Abernethy, MD as Consulting Physician (Hematology and Oncology)  REFERRING PROVIDER: Dr.Secord CHIEF COMPLAINTS/REASON FOR VISIT:  Follow up for rectal cancer  HISTORY OF PRESENTING ILLNESS:  Jacqueline Yoder is a  74 y.o.  female with PMH listed below who was referred to me for evaluation of endometrial cancer.   Patient initially presented with complaints of postmenopausal bleeding on 08/16/2018.  History of was menopausal vaginal bleeding in 2016 which resulted in cervical polypectomy.  Pathology 02/04/2015 showed cervical polyp, consistent with benign endometrial polyp.  [name] after polypectomy due to anxiety associated with pelvic exams.  pelvic exam on 08/16/2018 reviewed cervical abnormality and from enlarged uterus. Seen by Dr. Cherry on 10/29/2018.  Endometrial biopsy and a Pap smear was performed. 10/29/2018 Pap smear showed adenocarcinoma, favor endometrial origin. 10/29/2018 endometrial biopsy showed endometrioid carcinoma, FIGO grade 1.  10/29/2018- TA & TV Ultrasound revealed: Anteverted uterus measuring 8.7 x 5.6 x 6.4 cm without evidence of focal masses.  The endometrium measuring 24.1 mm (thickened) and heterogeneous.  Right and left ovaries not visualized.  No adnexal masses identified.  No free fluid in cul-de-sac.  Patient was seen by Dr. Secord in clinic on 11/13/2018.  Cervical exam reveals 2 cm exophytic irregular mass consistent with malignancy.   11/19/2018 CT chest abdomen pelvis with contrast showed thickened endometrium with some irregularity compatible with the provided diagnosis of endometrial malignancy.  There is a mildly prominent left inguinal node 1.4 cm.  Patient was seen by Dr. Berchuck on 11/20/2018 and left groin  lymph node biopsy was recommended.  11/26/2018 patient underwent left inguinal lymph node biopsy. Pathology showed metastatic adenocarcinoma consistent with colorectal origin.  CDX 2+.  Case was discussed on tumor board.  Recommend colonoscopy for further evaluation.  Patient reports significant weight loss 30 pounds over the last year.  Chronic vaginal spotting. Change of bowel habits the past few months.  More constipated.  Family history positive for brother who has colon cancer prostate cancer.  patient has underwent colonoscopy on 12/03/2018 which reviewed a nonobstructing large mass in the rectum.  Also chronic fistula.  Mass was not circumferential.  This was biopsied with a cold forceps for histology.  Pathology came back hyperplastic polyp negative for dysplasia and malignancy. [name], patient underwent flex sigmoidoscopy on 12/06/2018 with rebiopsy of the rectal mass. This time biopsy results came back positive for invasive colorectal adenocarcinoma, moderately differentiated. Immunotherapy for nearly mismatch repair protein (MMR ) was performed.  There is no loss of MMR expression.  low probability of MSI high.   # Seen by [name] for evaluation of resectability for rectal cancer. In addition, she also had a second opinion with [facility] where her endometrial biopsy pathology was changed to  adenocarcinoma, consistent with colorectal primary.   [name]. She has home health that has been assisting with ostomy care  Patient was also evaluated by [facility] oncology.  Recommendation is to proceed with TNT with concurrent chemoradiation followed by neoadjuvant chemotherapy followed by surgical resection. Patient prefers to have treatment done locally with [facility].   # Oncology Treatment:  02/03/2019- 03/19/2019  concurrent Xeloda and radiation.  Xeloda dose 825mg /m2 BID - rounded to 1650mg BID- on days of  radiation. 04/09/2019, started   on FOLFOX with bolus 5-FU omitted.  07/16/2019 finished 8 cycles of FOLFOX. 09/17/19 APR/posterior vaginectomy/TAH/BSO/VY-flap pT4b pN0 with close vaginal margin 0.2 mm.  Uterus and ovaries negative for malignancy. Patient reports bilateral lower extremity numbness and tingling, intermittent, left worse than right. She has lost a lot of weight since her APR surgery.   #Family history with half brother having's history of colon cancer prostate cancer.  Personal history of colorectal cancer.  Patient has not decided if she wants genetic testing.    # history of PE( 01/13/2020)  in the bilateral lower extremity DVT (01/13/2020).   She finishes 6 months of anticoagulation with Eliquis 5 mg twice daily. Now switched to Eliquis 2.5 mg twice daily..  # She has now developed recurrent disease. #06/30/20  vaginal introitus mass biopsied. Pathology is consistent with metastatic colorectal adenocarcinoma I have discussed with [name]  Dr. Hester Mates and the mass is not resectable. Patient has also had colonoscopy by Dr. Vicente Males yesterday. Normal examination. # 07/16/2020 cycle 1 FOLFIRI  # 07/20/2020 PET scan was done for further evaluation, images are consistent with local recurrence, no distant metastasis. #Discussed with radiation oncology Dr. Baruch Gouty will recommends concurrent chemotherapy and radiation. 08/02/2020-08/16/2020, patient starts radiation.  Xeloda was held due to neutropenia 08/17/2020,-09/06/2020 Xeloda 1500 mg twice daily concurrently with radiation  01/31/21 cycle 1 FOLFIRI + Bev 02/28/2021 Cycle 2 FOLFIRI + Bev- Chemotherapy was delayed for the past 2 weeks due to neutropenia.  INTERVAL HISTORY Jacqueline Yoder is a 75 y.o. female who has above history reviewed by me presents for follow-up of rectal cancer. Patient tolerated chemotherapy 2 weeks ago.  Occasional loose bowel movements.  Denies any abdominal pain, nausea vomiting diarrhea.Husband passed away during  the interval has.  She is grieving Has lost weight due to decreased oral intake  Review of Systems  Constitutional:  Positive for appetite change and unexpected weight change. Negative for chills, fatigue and fever.  HENT:   Negative for hearing loss and voice change.   Eyes:  Negative for eye problems.  Respiratory:  Negative for chest tightness and cough.   Cardiovascular:  Negative for chest pain.  Gastrointestinal:  Negative for abdominal distention, abdominal pain, blood in stool, constipation, diarrhea and nausea.  Endocrine: Negative for hot flashes.  Genitourinary:  Negative for difficulty urinating and frequency.   Musculoskeletal:  Negative for arthralgias.  Skin:  Negative for itching and rash.  Neurological:  Negative for extremity weakness and numbness.  Hematological:  Negative for adenopathy.  Psychiatric/Behavioral:  Negative for confusion.    MEDICAL HISTORY:  Past Medical History:  Diagnosis Date   Allergy    Arthritis    Blood clot in vein    Family history of colon cancer    GERD (gastroesophageal reflux disease)    Hypercholesteremia    Hypertension    Hypertension    Lower extremity edema    Rectal cancer ([place]) 12/2018   Urinary incontinence     SURGICAL HISTORY: Past Surgical History:  Procedure Laterality Date   ABDOMINAL HYSTERECTOMY     CHOLECYSTECTOMY  1971   COLONOSCOPY WITH PROPOFOL N/A 12/03/2018   Procedure: COLONOSCOPY WITH PROPOFOL;  Surgeon: Lucilla Lame, MD;  Location: [facility] ENDOSCOPY;  Service: Endoscopy;  Laterality: N/A;   COLONOSCOPY WITH PROPOFOL N/A 07/15/2020   Procedure: COLONOSCOPY WITH PROPOFOL;  Surgeon: [name], MD;  Location: [facility] ENDOSCOPY;  Service: Gastroenterology;  Laterality: N/A;   FLEXIBLE SIGMOIDOSCOPY N/A 12/06/2018   Procedure: FLEXIBLE SIGMOIDOSCOPY;  Surgeon: Vicente Males,  Bailey Mech, MD;  Location: [facility] [place] [facility] ENDOSCOPY;  Service: Endoscopy;  Laterality: N/A;   LAPAROSCOPIC COLOSTOMY  01/06/2019   PORTACATH PLACEMENT N/A  04/03/2019   Procedure: INSERTION PORT-A-CATH;  Surgeon: Jules Husbands, MD;  Location: [facility] ORS;  Service: General;  Laterality: N/A;    SOCIAL HISTORY: Social History   Socioeconomic History   Marital status: Married    Spouse name: Not on file   Number of children: Not on file   Years of education: Not on file   Highest education level: Not on file  Occupational History   Not on file  Tobacco Use   Smoking status: Former    Pack years: 0.00    Types: Cigarettes    Quit date: 12/02/1977    Years since quitting: 43.3   Smokeless tobacco: Former  Scientific laboratory technician Use: Never used  Substance and Sexual Activity   Alcohol use: Never   Drug use: Never   Sexual activity: Not Currently    Birth control/protection: None  Other Topics Concern   Not on file  Social History Narrative   Lives with daughter   Social Determinants of Health   Financial Resource Strain: Not on file  Food Insecurity: Not on file  Transportation Needs: Not on file  Physical Activity: Not on file  Stress: Not on file  Social Connections: Not on file  Intimate Partner Violence: Not on file    FAMILY HISTORY: Family History  Problem Relation Age of Onset   Colon cancer Brother 14       exposure to chemicals [place]   Hypertension Mother    Stroke Mother    Kidney failure Father    Breast cancer Neg Hx    Ovarian cancer Neg Hx     ALLERGIES:  is allergic to sulfamethoxazole-trimethoprim.  MEDICATIONS:  Current Outpatient Medications  Medication Sig Dispense Refill   apixaban (ELIQUIS) 2.5 MG TABS tablet Take 1 tablet (2.5 mg total) by mouth 2 (two) times daily. 60 tablet 5   Cholecalciferol (VITAMIN D3) 2000 units capsule Take 2,000 Units by mouth daily.     diclofenac sodium (VOLTAREN) 1 % GEL Apply 2 g topically 4 (four) times daily as needed (joint pain).  11   docusate sodium (COLACE) 100 MG capsule Take 100 mg by mouth 2 (two) times daily as needed.     fluticasone (FLONASE) 50  MCG/ACT nasal spray Place 1 spray into both nostrils daily. 16 g 1   gabapentin (NEURONTIN) 100 MG capsule Take 1 capsule (100 mg total) by mouth at bedtime. 30 capsule 0   ipratropium (ATROVENT) 0.03 % nasal spray Place 1 spray into both nostrils 2 (two) times daily. 30 mL 2   lidocaine-prilocaine (EMLA) cream Apply 1 application topically as needed. 30 g 6   loratadine (CLARITIN) 10 MG tablet Take 10 mg by mouth daily.     Multiple Vitamins-Minerals (ONE-A-DAY WOMENS 50 PLUS PO) Take 1 tablet by mouth daily.      ondansetron (ZOFRAN) 8 MG tablet Take 1 tablet (8 mg total) by mouth 2 (two) times daily as needed for refractory nausea / vomiting. Start on day 3 after chemotherapy. 30 tablet 1   potassium chloride SA (K-DUR) 20 MEQ tablet Take 20 mEq by mouth daily.      prochlorperazine (COMPAZINE) 10 MG tablet Take 1 tablet (10 mg total) by mouth every 6 (six) hours as needed (NAUSEA). 30 tablet 1   simvastatin (ZOCOR) 40 MG tablet Take 40 mg by  mouth at bedtime.      triamterene-hydrochlorothiazide (DYAZIDE) 37.5-25 MG capsule Take 1 capsule by mouth daily.      zinc gluconate 50 MG tablet Take 50 mg by mouth daily.     fexofenadine (ALLEGRA) 180 MG tablet Take 180 mg by mouth daily. (Patient not taking: Reported on 03/29/2021)     loperamide (IMODIUM) 2 MG capsule Take 1 capsule (2 mg total) by mouth See admin instructions. With onset of loose stool, take 26m followed by 283mevery 2 hours,  Maximum: 16 mg/day (Patient not taking: Reported on 03/29/2021) 120 capsule 1   No current facility-administered medications for this visit.   Facility-Administered Medications Ordered in Other Visits  Medication Dose Route Frequency Provider Last Rate Last Admin   sodium chloride flush (NS) 0.9 % injection 10 mL  10 mL Intravenous PRN YuEarlie ServerMD   10 mL at 03/29/21 0836     PHYSICAL EXAMINATION: ECOG PERFORMANCE STATUS: 1 - Symptomatic but completely ambulatory  Physical Exam Constitutional:       General: She is not in acute distress. HENT:     Head: Normocephalic and atraumatic.  Eyes:     General: No scleral icterus. Cardiovascular:     Rate and Rhythm: Normal rate and regular rhythm.     Heart sounds: Normal heart sounds.  Pulmonary:     Effort: Pulmonary effort is normal. No respiratory distress.     Breath sounds: No wheezing.  Abdominal:     General: Bowel sounds are normal. There is no distension.     Palpations: Abdomen is soft.     Comments: + Colostomy bag   Musculoskeletal:        General: No deformity. Normal range of motion.     Cervical back: Normal range of motion and neck supple.  Skin:    General: Skin is warm and dry.     Findings: No erythema or rash.  Neurological:     Mental Status: She is alert and oriented to person, place, and time. Mental status is at baseline.     Cranial Nerves: No cranial nerve deficit.     Coordination: Coordination normal.  Psychiatric:     Comments: Grieving   CMP Latest Ref Rng & Units 03/15/2021  Glucose 70 - 99 mg/dL 107(H)  BUN 8 - 23 mg/dL 11  Creatinine 0.44 - 1.00 mg/dL 0.80  Sodium 135 - 145 mmol/L 138  Potassium 3.5 - 5.1 mmol/L 3.8  Chloride 98 - 111 mmol/L 101  CO2 22 - 32 mmol/L 27  Calcium 8.9 - 10.3 mg/dL 9.5  Total Protein 6.5 - 8.1 g/dL 7.1  Total Bilirubin 0.3 - 1.2 mg/dL 0.4  Alkaline Phos 38 - 126 U/L 65  AST 15 - 41 U/L 41  ALT 0 - 44 U/L 27   CBC Latest Ref Rng & Units 03/29/2021  WBC 4.0 - 10.5 K/uL 5.3  Hemoglobin 12.0 - 15.0 g/dL 12.5  Hematocrit 36.0 - 46.0 % 37.6  Platelets 150 - 400 K/uL 199    LABORATORY DATA:  I have reviewed the data as listed Lab Results  Component Value Date   WBC 5.3 03/29/2021   HGB 12.5 03/29/2021   HCT 37.6 03/29/2021   MCV 93.5 03/29/2021   PLT 199 03/29/2021   Recent Labs    06/23/20 1124 07/16/20 0802 07/23/20 1345 02/21/21 0835 02/28/21 0913 03/15/21 0804  NA 140 140   < > 139 136 138  K 3.7 3.2*   < >  3.7 3.7 3.8  CL 100 101   < > 101  100 101  CO2 30 29   < > _0 GLUCOSE 114* 114*   < > 104* 97 107*  BUN 10 9   < > _1 CREATININE 0.97 0.87   < > 0.90 0.90 0.80  CALCIUM 9.1 9.2   < > 9.3 9.4 9.5  GFRNONAA 58* >60   < > >60 >60 >60  GFRAA >60 >60  --   --   --   --   PROT 7.0 6.9   < > 6.9 7.1 7.1  ALBUMIN 4.0 3.6   < > 3.9 4.2 4.1  AST 25 24   < > 36 37 41  ALT 15 14   < > _2 ALKPHOS 41 41   < > 48 54 65  BILITOT 0.4 0.5   < > 0.5 0.7 0.4   < > = values in this interval not displayed.    Iron/TIBC/Ferritin/ %Sat    Component Value Date/Time   IRON 40 06/23/2020 1124   TIBC 246 (L) 06/23/2020 1124   FERRITIN 338 (H) 06/23/2020 1124   IRONPCTSAT 16 06/23/2020 1124     RADIOGRAPHIC STUDIES: I have personally reviewed the radiological images as listed and agreed with the findings in the report. CT CHEST ABDOMEN PELVIS W CONTRAST  Result Date: 01/20/2021 CLINICAL DATA:  Restaging rectal cancer diagnosed 03/05/2019. Status post APR with vaginal recurrence. EXAM: CT CHEST, ABDOMEN, AND PELVIS WITH CONTRAST TECHNIQUE: Multidetector CT imaging of the chest, abdomen and pelvis was performed following the standard protocol during bolus administration of intravenous contrast. CONTRAST:  131m OMNIPAQUE IOHEXOL 300 MG/ML  SOLN COMPARISON:  Multiple priors including PET-CT July 20, 2020 and CT chest abdomen and pelvis October 20, 2020 FINDINGS: CT CHEST FINDINGS Cardiovascular: Right chest wall Port-A-Cath with tip in the right atrium. Atherosclerotic thoracic aortic calcifications. No thoracic aortic aneurysm. No central pulmonary embolus. Normal size heart. No significant pericardial effusion/thickening. Mediastinum/Nodes: No discrete thyroid nodule. The trachea and esophagus are unremarkable. No pathologically enlarged mediastinal, hilar or axillary lymph nodes. Lungs/Pleura: There are multiple scattered new and/or enlarging bilateral pulmonary nodules. For instance: Increased size of the left upper lobe  pulmonary nodule which now measures 4 mm on image 37/4 previously 3 mm. Increased size of a right upper lobe pulmonary nodule which now measures 3 mm on image 47/4 previously 2 mm. Increased size of the a right middle lobe pulmonary nodule which now measures 8 mm previously 6 mm. New 2 mm left upper lobe pulmonary nodule on image 21/4. New 3 mm right upper lobe pulmonary nodule on image 68/4. No pleural effusion.  No pneumothorax.  No focal consolidation. Musculoskeletal: No aggressive lytic or blastic lesions of bone. CT ABDOMEN PELVIS FINDINGS Hepatobiliary: No suspicious hepatic lesions. Gallbladder is surgically absent. No biliary ductal dilation. Pancreas: Within normal limits. Spleen: Within normal limits. Adrenals/Urinary Tract: Bilateral adrenal glands are unremarkable. No hydronephrosis. Bilateral renal cysts. No solid enhancing renal lesions. Urinary bladder is grossly unremarkable for degree of distension Stomach/Bowel: The stomach and small bowel are grossly unremarkable. The terminal ileum and appendix are both unremarkable. No acute inflammatory process or obstructive findings. Stable surgical changes of APR and left lower quadrant colostomy. There is mild colonic wall thickening involving the distal transverse colon at the splenic flexure which appears similar to prior and may be related to under distension. Colonic diverticulosis without findings of acute  diverticulitis. Vascular/Lymphatic: Aortic and branch vessel atherosclerosis. No gastrohepatic or hepatoduodenal ligament lymphadenopathy. No retroperitoneal or mesenteric lymphadenopathy. No pelvic sidewall lymphadenopathy. New mildly enlarged bilateral inguinal lymph nodes which measures 1.2 cm on the right on image 104/3 and 1.4 cm on the left on image 102/3. Reproductive: Surgically absent Other: Persistent moderate vaginal wall thickening without discrete mass visualized. Musculoskeletal: Multilevel degenerative changes spine. Post radiation  change in the pelvis and sacrum. No aggressive lytic or blastic lesion of bone. IMPRESSION: 1. Multiple scattered new and/or enlarging bilateral pulmonary nodules, consistent with pulmonary metastatic disease. 2. Persistent moderate vaginal wall thickening without discrete mass. New mildly enlarged bilateral inguinal lymph nodes, nonspecific but concerning for disease involvement. 3. Mild colonic wall thickening involving the distal transverse colon at the splenic flexure which appears similar to prior and may be related to under distension. Consider attention on follow-up imaging versus colonoscopy. 4. Stable surgical changes of APR and left lower quadrant colostomy. 5. Aortic atherosclerosis. Aortic Atherosclerosis (ICD10-I70.0). Electronically Signed   By: Dahlia Bailiff MD   On: 01/20/2021 11:23   MM 3D SCREEN BREAST BILATERAL  Result Date: 01/12/2021 CLINICAL DATA:  Screening. EXAM: DIGITAL SCREENING BILATERAL MAMMOGRAM WITH TOMOSYNTHESIS AND CAD TECHNIQUE: Bilateral screening digital craniocaudal and mediolateral oblique mammograms were obtained. Bilateral screening digital breast tomosynthesis was performed. The images were evaluated with computer-aided detection. COMPARISON:  Previous exam(s). ACR Breast Density Category b: There are scattered areas of fibroglandular density. FINDINGS: There are no findings suspicious for malignancy. The images were evaluated with computer-aided detection. IMPRESSION: No mammographic evidence of malignancy. A result letter of this screening mammogram will be mailed directly to the patient. RECOMMENDATION: Screening mammogram in one year. (Code:SM-B-01Y) BI-RADS CATEGORY  1: Negative. Electronically Signed   By: Dorise Bullion III M.D   On: 01/12/2021 16:28     CT CHEST ABDOMEN PELVIS W CONTRAST  Result Date: 01/20/2021 CLINICAL DATA:  Restaging rectal cancer diagnosed 03/05/2019. Status post APR with vaginal recurrence. EXAM: CT CHEST, ABDOMEN, AND PELVIS WITH  CONTRAST TECHNIQUE: Multidetector CT imaging of the chest, abdomen and pelvis was performed following the standard protocol during bolus administration of intravenous contrast. CONTRAST:  170m OMNIPAQUE IOHEXOL 300 MG/ML  SOLN COMPARISON:  Multiple priors including PET-CT July 20, 2020 and CT chest abdomen and pelvis October 20, 2020 FINDINGS: CT CHEST FINDINGS Cardiovascular: Right chest wall Port-A-Cath with tip in the right atrium. Atherosclerotic thoracic aortic calcifications. No thoracic aortic aneurysm. No central pulmonary embolus. Normal size heart. No significant pericardial effusion/thickening. Mediastinum/Nodes: No discrete thyroid nodule. The trachea and esophagus are unremarkable. No pathologically enlarged mediastinal, hilar or axillary lymph nodes. Lungs/Pleura: There are multiple scattered new and/or enlarging bilateral pulmonary nodules. For instance: Increased size of the left upper lobe pulmonary nodule which now measures 4 mm on image 37/4 previously 3 mm. Increased size of a right upper lobe pulmonary nodule which now measures 3 mm on image 47/4 previously 2 mm. Increased size of the a right middle lobe pulmonary nodule which now measures 8 mm previously 6 mm. New 2 mm left upper lobe pulmonary nodule on image 21/4. New 3 mm right upper lobe pulmonary nodule on image 68/4. No pleural effusion.  No pneumothorax.  No focal consolidation. Musculoskeletal: No aggressive lytic or blastic lesions of bone. CT ABDOMEN PELVIS FINDINGS Hepatobiliary: No suspicious hepatic lesions. Gallbladder is surgically absent. No biliary ductal dilation. Pancreas: Within normal limits. Spleen: Within normal limits. Adrenals/Urinary Tract: Bilateral adrenal glands are unremarkable. No hydronephrosis. Bilateral renal cysts.  No solid enhancing renal lesions. Urinary bladder is grossly unremarkable for degree of distension Stomach/Bowel: The stomach and small bowel are grossly unremarkable. The terminal ileum and  appendix are both unremarkable. No acute inflammatory process or obstructive findings. Stable surgical changes of APR and left lower quadrant colostomy. There is mild colonic wall thickening involving the distal transverse colon at the splenic flexure which appears similar to prior and may be related to under distension. Colonic diverticulosis without findings of acute diverticulitis. Vascular/Lymphatic: Aortic and branch vessel atherosclerosis. No gastrohepatic or hepatoduodenal ligament lymphadenopathy. No retroperitoneal or mesenteric lymphadenopathy. No pelvic sidewall lymphadenopathy. New mildly enlarged bilateral inguinal lymph nodes which measures 1.2 cm on the right on image 104/3 and 1.4 cm on the left on image 102/3. Reproductive: Surgically absent Other: Persistent moderate vaginal wall thickening without discrete mass visualized. Musculoskeletal: Multilevel degenerative changes spine. Post radiation change in the pelvis and sacrum. No aggressive lytic or blastic lesion of bone. IMPRESSION: 1. Multiple scattered new and/or enlarging bilateral pulmonary nodules, consistent with pulmonary metastatic disease. 2. Persistent moderate vaginal wall thickening without discrete mass. New mildly enlarged bilateral inguinal lymph nodes, nonspecific but concerning for disease involvement. 3. Mild colonic wall thickening involving the distal transverse colon at the splenic flexure which appears similar to prior and may be related to under distension. Consider attention on follow-up imaging versus colonoscopy. 4. Stable surgical changes of APR and left lower quadrant colostomy. 5. Aortic atherosclerosis. Aortic Atherosclerosis (ICD10-I70.0). Electronically Signed   By: Dahlia Bailiff MD   On: 01/20/2021 11:23   MM 3D SCREEN BREAST BILATERAL  Result Date: 01/12/2021 CLINICAL DATA:  Screening. EXAM: DIGITAL SCREENING BILATERAL MAMMOGRAM WITH TOMOSYNTHESIS AND CAD TECHNIQUE: Bilateral screening digital craniocaudal  and mediolateral oblique mammograms were obtained. Bilateral screening digital breast tomosynthesis was performed. The images were evaluated with computer-aided detection. COMPARISON:  Previous exam(s). ACR Breast Density Category b: There are scattered areas of fibroglandular density. FINDINGS: There are no findings suspicious for malignancy. The images were evaluated with computer-aided detection. IMPRESSION: No mammographic evidence of malignancy. A result letter of this screening mammogram will be mailed directly to the patient. RECOMMENDATION: Screening mammogram in one year. (Code:SM-B-01Y) BI-RADS CATEGORY  1: Negative. Electronically Signed   By: Dorise Bullion III M.D   On: 01/12/2021 16:28     ASSESSMENT & PLAN:  1. Rectal cancer ([place])   2. Encounter for antineoplastic chemotherapy   3. Chemotherapy induced neutropenia (HCC)   4. Chemotherapy induced diarrhea   5. History of DVT (deep vein thrombosis)   6. History of pulmonary embolism   7. Current use of long term anticoagulation   8. Chronic deep vein thrombosis (DVT) of distal vein of both lower extremities (HCC)   9. Grieving   Cancer Staging Rectal cancer [facility]) Staging form: Colon and Rectum, AJCC 8th Edition - Clinical stage from 01/23/2019: Stage IIIC (cT4b, cN1a, cM0) - Signed by Earlie Server, MD on 01/23/2019 - Pathologic stage from 10/06/2019: Stage IIC (ypT4b, pN0, cM0) - Signed by Earlie Server, MD on 10/06/2019 - Pathologic: Stage Unknown (rpTX, pNX, cM1) - Signed by Earlie Server, MD on 01/31/2021  #History of stage IIIC Rectal cancer, s/p TNT, followed by 09/17/19 APR/posterior vaginectomy/TAH/BSO/VY-flap, pT4b pN0 with close vaginal margin 0.2 mm.  Uterus and ovaries negative for malignancy. palliative radiation to vaginal recurrence.  Finished 09/04/2020 01/19/21 recurrence with lung metastasis.-Palliative chemotherapy Labs are reviewed and discussed with patient. Proceed with FOLFIRI/bevacizumab chemotherapy- Irinotecan 150  mg/m. Patient will receive Neulasta on day  3.  take Claritin for 4 days. #Grieving, emotional support was provided. #Chemotherapy-induced neutropenia, see above.  Neutropenia has resolved. #Chemotherapy-induced diarrhea, symptoms are stable.  As needed antidiarrhea medications.Marland Kitchen  #Hypokalemia, potassium 3.5, continue potassium chloride 20 mEq daily.  Refill sent #History of pulmonary embolism, history of bilateral DVTcontinue  Eliquis 2.5 mg twice daily. #Chemotherapy-induced hives, recommend patient to take Benadryl and Pepcid at home.  I will add Singulair to next cycle as premedication. Follow-up 2 weeks for next cycle of treatment.  Earlie Server, MD, PhD

## 2021-03-29 NOTE — Progress Notes (Signed)
Per Duwayne Heck CMA per Dr. Tasia Catchings okay to proceed with treatment including Mvasi prior to urine protein results.   1250: hives noted to patient's chest, pt denies any other symptoms/concerns including SOB, swelling to tongue/throat, itching, etc. VSS. Dr. Tasia Catchings aware, Per Dr. Tasia Catchings, okay to proceed with Adrucil and discharge pt home. Pt to take Benadryl and Pepcid at home as directed with prior treatments. Pt verbalizes understanding and denies any questions at this time. Pt educated to seek emergency medical care if symptoms worsens. Pt verbalizes understanding. Pt stable at time of discharge.

## 2021-03-30 LAB — CEA: CEA: 1765 ng/mL — ABNORMAL HIGH (ref 0.0–4.7)

## 2021-03-31 ENCOUNTER — Other Ambulatory Visit: Payer: Self-pay

## 2021-03-31 ENCOUNTER — Inpatient Hospital Stay: Payer: Medicare Other

## 2021-03-31 VITALS — BP 128/77 | HR 67 | Temp 97.0°F | Resp 20

## 2021-03-31 DIAGNOSIS — Z7901 Long term (current) use of anticoagulants: Secondary | ICD-10-CM | POA: Diagnosis not present

## 2021-03-31 DIAGNOSIS — C20 Malignant neoplasm of rectum: Secondary | ICD-10-CM | POA: Diagnosis not present

## 2021-03-31 DIAGNOSIS — Z86711 Personal history of pulmonary embolism: Secondary | ICD-10-CM | POA: Diagnosis not present

## 2021-03-31 DIAGNOSIS — I10 Essential (primary) hypertension: Secondary | ICD-10-CM | POA: Diagnosis not present

## 2021-03-31 DIAGNOSIS — Z86718 Personal history of other venous thrombosis and embolism: Secondary | ICD-10-CM | POA: Diagnosis not present

## 2021-03-31 DIAGNOSIS — Z5111 Encounter for antineoplastic chemotherapy: Secondary | ICD-10-CM | POA: Diagnosis not present

## 2021-03-31 DIAGNOSIS — Z5189 Encounter for other specified aftercare: Secondary | ICD-10-CM | POA: Diagnosis not present

## 2021-03-31 DIAGNOSIS — E7800 Pure hypercholesterolemia, unspecified: Secondary | ICD-10-CM | POA: Diagnosis not present

## 2021-03-31 DIAGNOSIS — C799 Secondary malignant neoplasm of unspecified site: Secondary | ICD-10-CM

## 2021-03-31 DIAGNOSIS — C7982 Secondary malignant neoplasm of genital organs: Secondary | ICD-10-CM | POA: Diagnosis not present

## 2021-03-31 DIAGNOSIS — K219 Gastro-esophageal reflux disease without esophagitis: Secondary | ICD-10-CM | POA: Diagnosis not present

## 2021-03-31 DIAGNOSIS — Z79899 Other long term (current) drug therapy: Secondary | ICD-10-CM | POA: Diagnosis not present

## 2021-03-31 MED ORDER — SODIUM CHLORIDE 0.9% FLUSH
10.0000 mL | INTRAVENOUS | Status: DC | PRN
  Administered 2021-03-31: 10 mL
  Filled 2021-03-31: qty 10

## 2021-03-31 MED ORDER — PEGFILGRASTIM 6 MG/0.6ML ~~LOC~~ PSKT
6.0000 mg | PREFILLED_SYRINGE | Freq: Once | SUBCUTANEOUS | Status: AC
  Administered 2021-03-31: 6 mg via SUBCUTANEOUS
  Filled 2021-03-31: qty 0.6

## 2021-03-31 MED ORDER — HEPARIN SOD (PORK) LOCK FLUSH 100 UNIT/ML IV SOLN
500.0000 [IU] | Freq: Once | INTRAVENOUS | Status: AC | PRN
  Administered 2021-03-31: 500 [IU]
  Filled 2021-03-31: qty 5

## 2021-03-31 MED ORDER — HEPARIN SOD (PORK) LOCK FLUSH 100 UNIT/ML IV SOLN
INTRAVENOUS | Status: AC
  Filled 2021-03-31: qty 5

## 2021-03-31 NOTE — Patient Instructions (Signed)
[facility]  Discharge Instructions: Thank you for choosing [facility] to provide your oncology and hematology care.  If you have a lab appointment with the [facility], please go directly to the [facility] and check in at the registration area.  Wear comfortable clothing and clothing appropriate for easy access to any Portacath or PICC line.   We strive to give you quality time with your provider. You may need to reschedule your appointment if you arrive late (15 or more minutes).  Arriving late affects you and other patients whose appointments are after yours.  Also, if you miss three or more appointments without notifying the office, you may be dismissed from the clinic at the provider's discretion.      For prescription refill requests, have your pharmacy contact our office and allow 72 hours for refills to be completed.    Today you received the following chemotherapy and/or immunotherapy agents neulasta      To help prevent nausea and vomiting after your treatment, we encourage you to take your nausea medication as directed.  BELOW ARE SYMPTOMS THAT SHOULD BE REPORTED IMMEDIATELY: *FEVER GREATER THAN 100.4 F (38 C) OR HIGHER *CHILLS OR SWEATING *NAUSEA AND VOMITING THAT IS NOT CONTROLLED WITH YOUR NAUSEA MEDICATION *UNUSUAL SHORTNESS OF BREATH *UNUSUAL BRUISING OR BLEEDING *URINARY PROBLEMS (pain or burning when urinating, or frequent urination) *BOWEL PROBLEMS (unusual diarrhea, constipation, pain near the anus) TENDERNESS IN MOUTH AND THROAT WITH OR WITHOUT PRESENCE OF ULCERS (sore throat, sores in mouth, or a toothache) UNUSUAL RASH, SWELLING OR PAIN  UNUSUAL VAGINAL DISCHARGE OR ITCHING   Items with * indicate a potential emergency and should be followed up as soon as possible or go to the Emergency Department if any problems should occur.  Please show the CHEMOTHERAPY ALERT CARD or IMMUNOTHERAPY ALERT CARD at check-in to  the Emergency Department and triage nurse.  Should you have questions after your visit or need to cancel or reschedule your appointment, please contact [facility]  [phone] and follow the prompts.  Office hours are 8:00 a.m. to 4:30 p.m. Monday - Friday. Please note that voicemails left after 4:00 p.m. may not be returned until the following business day.  We are closed weekends and major holidays. You have access to a nurse at all times for urgent questions. Please call the main number to the clinic [phone] and follow the prompts.  For any non-urgent questions, you may also contact your provider using MyChart. We now offer e-Visits for anyone 19 and older to request care online for non-urgent symptoms. For details visit [email or web address].   Also download the MyChart app! Go to the app store, search "MyChart", open the app, select [facility], and log in with your MyChart username and password.  Due to Covid, a mask is required upon entering the hospital/clinic. If you do not have a mask, one will be given to you upon arrival. For doctor visits, patients may have 1 support person aged 32 or older with them. For treatment visits, patients cannot have anyone with them due to current Covid guidelines and our immunocompromised population.     Pegfilgrastim injection What is this medication? PEGFILGRASTIM (PEG fil gra stim) is a long-acting granulocyte colony-stimulating factor that stimulates the growth of neutrophils, a type of white blood cell important in the body's fight against infection. It is used to reduce the incidence of fever and infection in patients with certain types of  cancer who are receiving chemotherapy that affects the bone marrow, and toincrease survival after being exposed to high doses of radiation. This medicine may be used for other purposes; ask your health care provider orpharmacist if you have questions. COMMON BRAND NAME(S):  Rexene Edison, Ziextenzo What should I tell my care team before I take this medication? They need to know if you have any of these conditions: kidney disease latex allergy ongoing radiation therapy sickle cell disease skin reactions to acrylic adhesives (On-Body Injector only) an unusual or allergic reaction to pegfilgrastim, filgrastim, other medicines, foods, dyes, or preservatives pregnant or trying to get pregnant breast-feeding How should I use this medication? This medicine is for injection under the skin. If you get this medicine at home, you will be taught how to prepare and give the pre-filled syringe or how to use the On-body Injector. Refer to the patient Instructions for Use for detailed instructions. Use exactly as directed. Tell your healthcare provider immediately if you suspect that the On-body Injector may not have performed as intended or if you suspect the use of the On-body Injector resulted in a missedor partial dose. It is important that you put your used needles and syringes in a special sharps container. Do not put them in a trash can. If you do not have a sharpscontainer, call your pharmacist or healthcare provider to get one. Talk to your pediatrician regarding the use of this medicine in children. Whilethis drug may be prescribed for selected conditions, precautions do apply. Overdosage: If you think you have taken too much of this medicine contact apoison control center or emergency room at once. NOTE: This medicine is only for you. Do not share this medicine with others. What if I miss a dose? It is important not to miss your dose. Call your doctor or health care professional if you miss your dose. If you miss a dose due to an On-body Injector failure or leakage, a new dose should be administered as soon aspossible using a single prefilled syringe for manual use. What may interact with this medication? Interactions have not been studied. This  list may not describe all possible interactions. Give your health care provider a list of all the medicines, herbs, non-prescription drugs, or dietary supplements you use. Also tell them if you smoke, drink alcohol, or use illegaldrugs. Some items may interact with your medicine. What should I watch for while using this medication? Your condition will be monitored carefully while you are receiving thismedicine. You may need blood work done while you are taking this medicine. Talk to your health care provider about your risk of cancer. You may be more atrisk for certain types of cancer if you take this medicine. If you are going to need a MRI, CT scan, or other procedure, tell your doctorthat you are using this medicine (On-Body Injector only). What side effects may I notice from receiving this medication? Side effects that you should report to your doctor or health care professionalas soon as possible: allergic reactions (skin rash, itching or hives, swelling of the face, lips, or tongue) back pain dizziness fever pain, redness, or irritation at site where injected pinpoint red spots on the skin red or dark-brown urine shortness of breath or breathing problems stomach or side pain, or pain at the shoulder swelling tiredness trouble passing urine or change in the amount of urine unusual bruising or bleeding Side effects that usually do not require medical attention (report to yourdoctor or health care professional  if they continue or are bothersome): bone pain muscle pain This list may not describe all possible side effects. Call your doctor for medical advice about side effects. You may report side effects to FDA at1-800-FDA-1088. Where should I keep my medication? Keep out of the reach of children. If you are using this medicine at home, you will be instructed on how to storeit. Throw away any unused medicine after the expiration date on the label. NOTE: This sheet is a summary. It may  not cover all possible information. If you have questions about this medicine, talk to your doctor, pharmacist, orhealth care provider.  2022 Elsevier/Gold Standard (2020-10-29 11:54:14)

## 2021-03-31 NOTE — Progress Notes (Signed)
Pt reports tolerating her infusion pump at home with no complications.  Neulasta on pro applied to left arm, pt verbalized understanding, all questions answered.  Pt left infusion suite stable and ambulatory.

## 2021-04-02 DIAGNOSIS — C20 Malignant neoplasm of rectum: Secondary | ICD-10-CM | POA: Diagnosis not present

## 2021-04-02 DIAGNOSIS — C799 Secondary malignant neoplasm of unspecified site: Secondary | ICD-10-CM | POA: Diagnosis not present

## 2021-04-04 ENCOUNTER — Inpatient Hospital Stay (HOSPITAL_BASED_OUTPATIENT_CLINIC_OR_DEPARTMENT_OTHER): Payer: Medicare Other | Admitting: Hospice and Palliative Medicine

## 2021-04-04 DIAGNOSIS — Z515 Encounter for palliative care: Secondary | ICD-10-CM

## 2021-04-04 DIAGNOSIS — C799 Secondary malignant neoplasm of unspecified site: Secondary | ICD-10-CM

## 2021-04-04 IMAGING — CT CT CHEST-ABD-PELV W/ CM
3 of 5 series · 14 of 36 positions shown, 16 images · IV contrast (omnipaque)
Comparison: PET-CT 07/20/2020 and CT scan 06/25/2020

CLINICAL DATA: Restaging rectal cancer. History of rectal cancer
status post APR with vaginal recurrence.

EXAM:
CT CHEST, ABDOMEN, AND PELVIS WITH CONTRAST
TECHNIQUE: Multidetector CT imaging of the chest, abdomen and pelvis was
performed following the standard protocol during bolus
administration of intravenous contrast.
CONTRAST:  100mL OMNIPAQUE IOHEXOL 300 MG/ML  SOLN

[Series 2: axials cap 5.00 · axial · 0.79mm/px · z∈[-1429,-949]mm · 9 of 122 slices shown, 11 images]
[im 13/122  mediastinal]
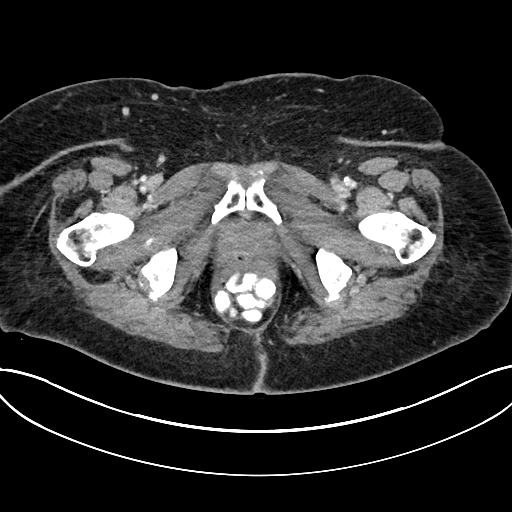
[im 13/122  bone]
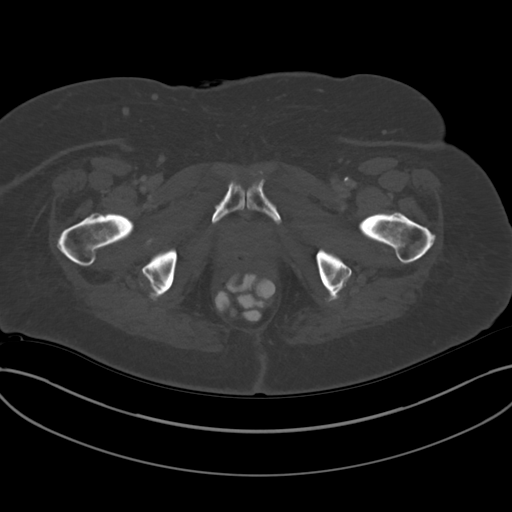
[im 25/122  mediastinal]
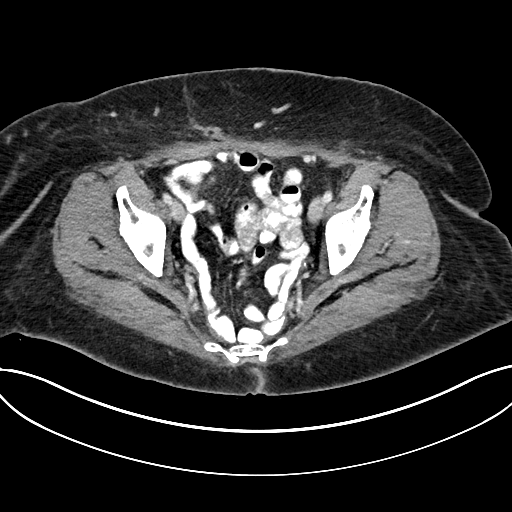
[im 37/122  mediastinal]
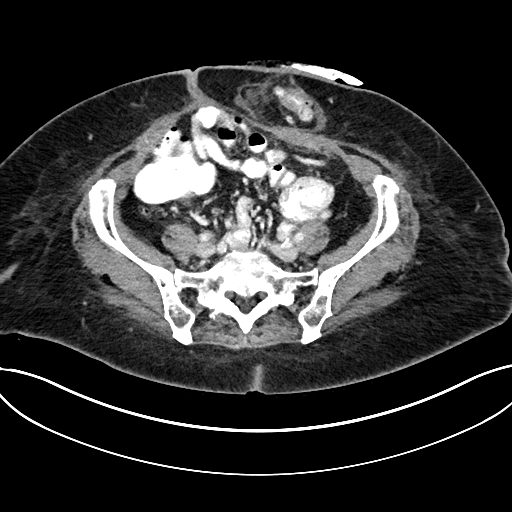
[im 49/122  mediastinal]
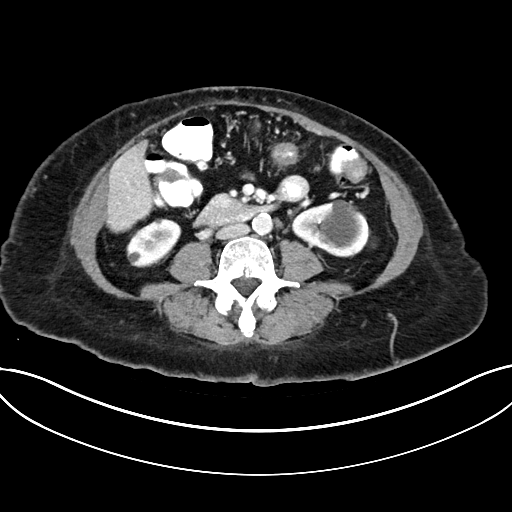
[im 61/122  mediastinal]
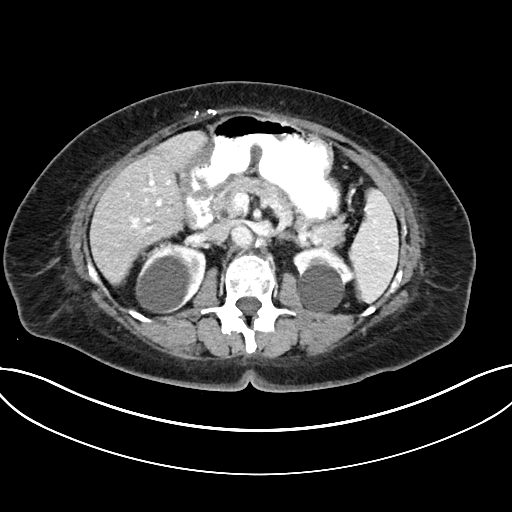
[im 73/122  mediastinal]
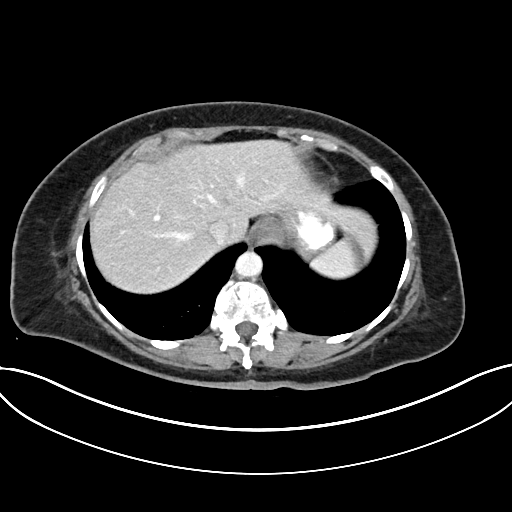
[im 85/122  mediastinal]
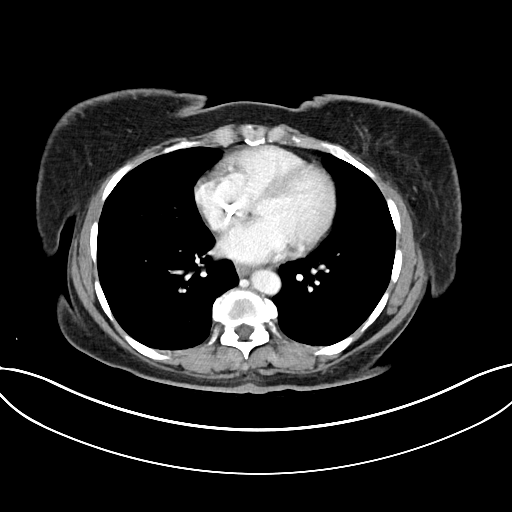
[im 97/122  mediastinal]
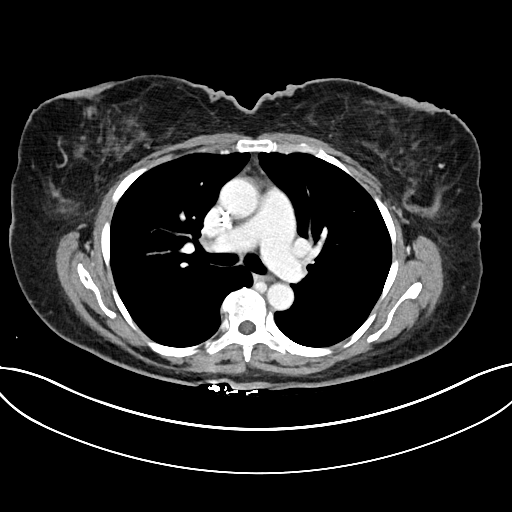
[im 109/122  mediastinal]
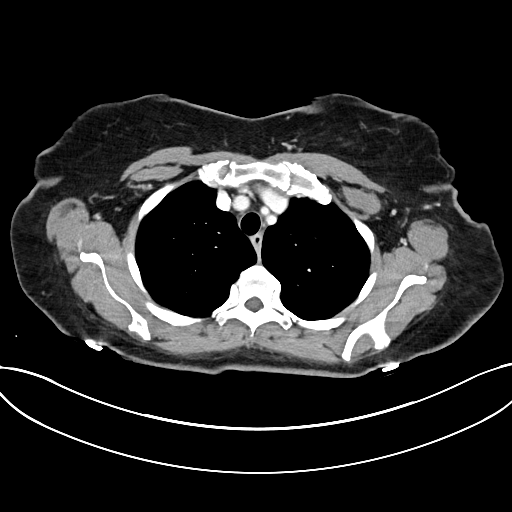
[im 109/122  bone]
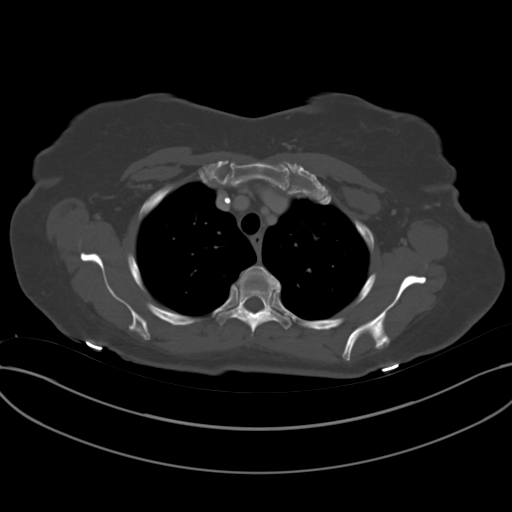

[Series 3: lungs cap 2.00 · axial · 0.79mm/px · z∈[-1168,-1120]mm · 2 of 155 slices shown]
[im 12/155  mediastinal]
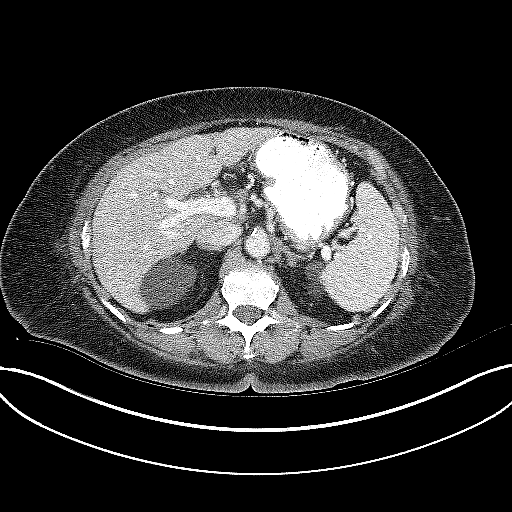
[im 36/155  mediastinal]
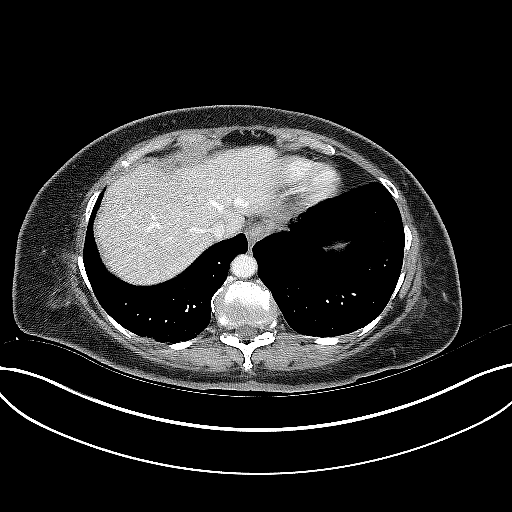

[Series 4: coronals cap 2.00 cor · coronal · 0.79mm/px · 3 of 135 slices shown]
[im 27/135  mediastinal]
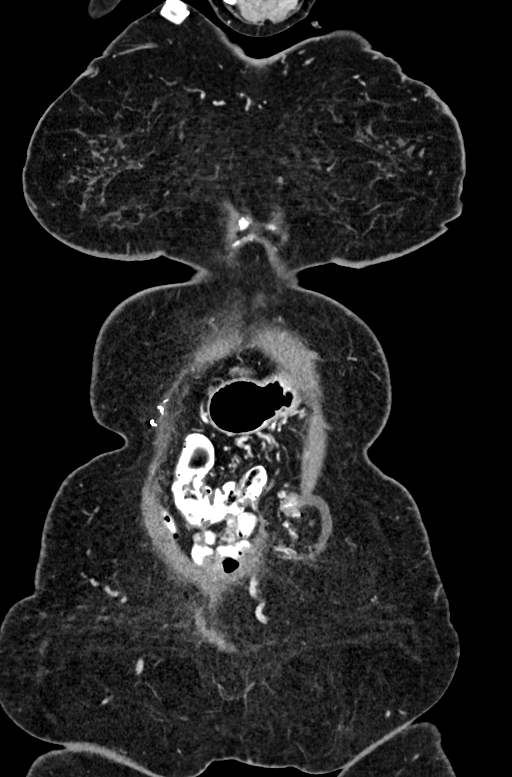
[im 54/135  mediastinal]
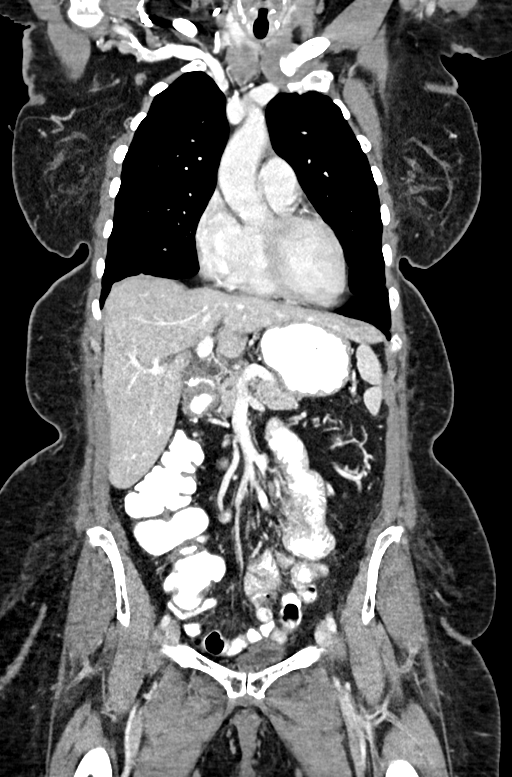
[im 81/135  mediastinal]
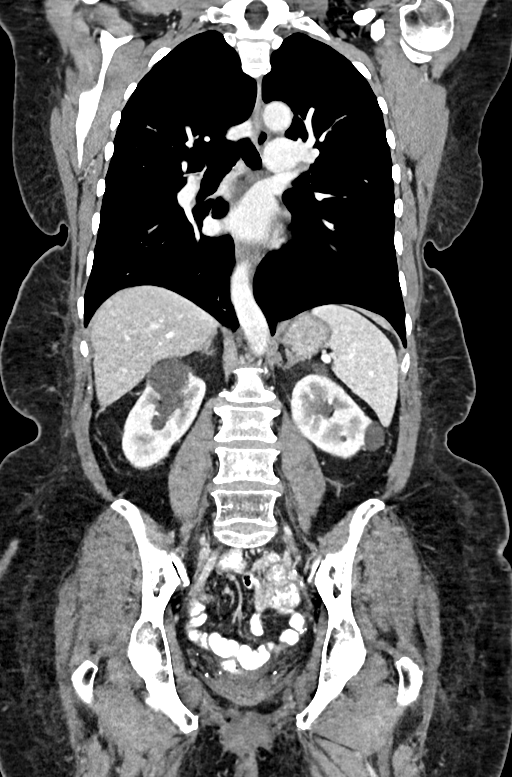

[14 of 36 positions shown; findings below may reference images not displayed]

FINDINGS: CT CHEST FINDINGS

Cardiovascular: The heart is normal in size. No pericardial
effusion. The aorta is normal in caliber. No dissection. Stable
scattered atherosclerotic calcifications. Stable coronary artery
calcifications.

Mediastinum/Nodes: No mediastinal or hilar mass or lymphadenopathy.
The esophagus is grossly normal. The thyroid gland is unremarkable.

Lungs/Pleura: No acute pulmonary findings. Enlarging right middle
lobe pulmonary nodule on image 98/3. It measures 6 mm and previously
measured 4 mm.

Stable small perifissural nodule in the right lower lobe on image
80/3

New 3 mm nodule in the left upper lobe on image number 43/3. Few
other tiny scattered pulmonary nodules are noted.

Musculoskeletal: No breast masses, supraclavicular or axillary
adenopathy. The right Port-A-Cath is stable. The bony thorax is
intact. No bone lesions or fractures.

CT ABDOMEN PELVIS FINDINGS

Hepatobiliary: No worrisome hepatic lesions to suggest hepatic
metastatic disease. No intrahepatic biliary dilatation. Normal
caliber and course of the common bile duct. Gallbladder is
surgically absent.

Pancreas: No mass, inflammation or ductal dilatation.

Spleen: Normal size. No focal lesions.

Adrenals/Urinary Tract: The adrenal glands are normal.

Bilateral renal cysts are noted. No worrisome renal lesions or
hydronephrosis. The bladder is unremarkable.

Stomach/Bowel: The stomach, duodenum and small bowel are
unremarkable. No acute inflammatory process or obstructive findings.
The terminal ileum is normal. The appendix is normal.

Stable surgical changes from prior APR and stable left lower
quadrant colostomy. No colonic mass is identified. No obstructive
findings. Moderate diverticulosis.

Vascular/Lymphatic: Stable atherosclerotic calcifications involving
the aorta and branch vessels but no aneurysm or dissection. The
major venous structures are patent. No mesenteric or retroperitoneal
mass or adenopathy.

Reproductive: Surgically absent.

Other: Persistent moderate vaginal wall thickening. No discrete
mass. No para vaginal or pelvic lymphadenopathy. No inguinal
adenopathy.

Musculoskeletal: No significant bony findings.
IMPRESSION: 1. Stable surgical changes from prior APR and left lower quadrant
colostomy. No findings for recurrent tumor, patent metastatic
disease or abdominal/pelvic lymphadenopathy.
2. Enlarging right middle lobe pulmonary nodule and new 3 mm left
upper lobe pulmonary nodule worrisome for pulmonary metastatic
disease.
3. Persistent moderate vaginal wall thickening but no discrete mass.
No pelvic or inguinal adenopathy.
4. Aortic atherosclerosis.

Aortic Atherosclerosis ([id number]).

## 2021-04-04 NOTE — Progress Notes (Signed)
Virtual Visit via Telephone Note  I connected with Jacqueline Yoder on 04/04/21 at 10:30 AM EDT by telephone and verified that I am speaking with the correct person using two identifiers.  Location: Patient: Home Provider: Clinic   I discussed the limitations, risks, security and privacy concerns of performing an evaluation and management service by telephone and the availability of in person appointments. I also discussed with the patient that there may be a patient responsible charge related to this service. The patient expressed understanding and agreed to proceed.   History of Present Illness: Jacqueline Yoder is a 75 y.o. female with multiple medical problems including stage IV colorectal cancer initially diagnosed 10/29/2018 stage IIIC disease.  Patient underwent APR/posteriorvaginectomy/TAH/BSO/VY-flap and chemoradiation.  She developed local recurrence 07/20/2020 and received additional chemoradiation.  CT of the chest, abdomen, and pelvis on 01/19/2021 revealed multiple scattered new pulmonary nodules consistent with disease progression.  Palliative care was consulted to progress goals and manage ongoing symptoms.   Observations/Objective: I called and spoke with patient by phone.  She reports that her husband died unexpectedly the beginning of this month.  She discussed her grief and she feels that she is coping reasonably given her loss.  We discussed option for bereavement counseling through [facility].  Patient says that she has good support from family and has strong faith.  Otherwise, patient denies symptomatic concerns.  She felt she was doing reasonably well.  Assessment and Plan: Stage IV colorectal cancer -seems to be tolerating treatment reasonably well. Will continue to follow.   Grief -seems to be coping adequately given the recent loss of her spouse.  Follow Up Instructions: Follow-up MyChart 1 to 2 months   I discussed the assessment and treatment plan with the patient. The  patient was provided an opportunity to ask questions and all were answered. The patient agreed with the plan and demonstrated an understanding of the instructions.   The patient was advised to call back or seek an in-person evaluation if the symptoms worsen or if the condition fails to improve as anticipated.  I provided 10 minutes of non-face-to-face time during this encounter.   Irean Hong, NP

## 2021-04-12 ENCOUNTER — Encounter: Payer: Self-pay | Admitting: Oncology

## 2021-04-12 ENCOUNTER — Inpatient Hospital Stay: Payer: Medicare Other

## 2021-04-12 ENCOUNTER — Inpatient Hospital Stay (HOSPITAL_BASED_OUTPATIENT_CLINIC_OR_DEPARTMENT_OTHER): Payer: Medicare Other | Admitting: Oncology

## 2021-04-12 VITALS — BP 128/52 | HR 66 | Temp 96.8°F | Resp 17

## 2021-04-12 VITALS — BP 113/66 | HR 74 | Temp 97.6°F | Resp 17 | Wt 172.0 lb

## 2021-04-12 DIAGNOSIS — Z5111 Encounter for antineoplastic chemotherapy: Secondary | ICD-10-CM

## 2021-04-12 DIAGNOSIS — K521 Toxic gastroenteritis and colitis: Secondary | ICD-10-CM

## 2021-04-12 DIAGNOSIS — T451X5A Adverse effect of antineoplastic and immunosuppressive drugs, initial encounter: Secondary | ICD-10-CM | POA: Diagnosis not present

## 2021-04-12 DIAGNOSIS — C20 Malignant neoplasm of rectum: Secondary | ICD-10-CM | POA: Diagnosis not present

## 2021-04-12 DIAGNOSIS — Z86718 Personal history of other venous thrombosis and embolism: Secondary | ICD-10-CM | POA: Diagnosis not present

## 2021-04-12 DIAGNOSIS — E876 Hypokalemia: Secondary | ICD-10-CM

## 2021-04-12 DIAGNOSIS — C799 Secondary malignant neoplasm of unspecified site: Secondary | ICD-10-CM | POA: Diagnosis not present

## 2021-04-12 DIAGNOSIS — K219 Gastro-esophageal reflux disease without esophagitis: Secondary | ICD-10-CM | POA: Diagnosis not present

## 2021-04-12 DIAGNOSIS — D701 Agranulocytosis secondary to cancer chemotherapy: Secondary | ICD-10-CM | POA: Diagnosis not present

## 2021-04-12 DIAGNOSIS — E7800 Pure hypercholesterolemia, unspecified: Secondary | ICD-10-CM | POA: Diagnosis not present

## 2021-04-12 DIAGNOSIS — Z86711 Personal history of pulmonary embolism: Secondary | ICD-10-CM

## 2021-04-12 DIAGNOSIS — I10 Essential (primary) hypertension: Secondary | ICD-10-CM | POA: Diagnosis not present

## 2021-04-12 DIAGNOSIS — C7982 Secondary malignant neoplasm of genital organs: Secondary | ICD-10-CM | POA: Diagnosis not present

## 2021-04-12 DIAGNOSIS — Z7901 Long term (current) use of anticoagulants: Secondary | ICD-10-CM | POA: Diagnosis not present

## 2021-04-12 DIAGNOSIS — Z5189 Encounter for other specified aftercare: Secondary | ICD-10-CM | POA: Diagnosis not present

## 2021-04-12 DIAGNOSIS — Z79899 Other long term (current) drug therapy: Secondary | ICD-10-CM | POA: Diagnosis not present

## 2021-04-12 LAB — COMPREHENSIVE METABOLIC PANEL
ALT: 19 U/L (ref 0–44)
AST: 31 U/L (ref 15–41)
Albumin: 4 g/dL (ref 3.5–5.0)
Alkaline Phosphatase: 73 U/L (ref 38–126)
Anion gap: 8 (ref 5–15)
BUN: 8 mg/dL (ref 8–23)
CO2: 28 mmol/L (ref 22–32)
Calcium: 9.3 mg/dL (ref 8.9–10.3)
Chloride: 100 mmol/L (ref 98–111)
Creatinine, Ser: 0.77 mg/dL (ref 0.44–1.00)
GFR, Estimated: >60 mL/min (ref >60)
Glucose, Bld: 102 mg/dL — ABNORMAL HIGH (ref 70–99)
Potassium: 3.5 mmol/L (ref 3.5–5.1)
Sodium: 136 mmol/L (ref 135–145)
Total Bilirubin: 0.8 mg/dL (ref 0.3–1.2)
Total Protein: 6.8 g/dL (ref 6.5–8.1)

## 2021-04-12 LAB — CBC WITH DIFFERENTIAL/PLATELET
Abs Immature Granulocytes: 0.44 10*3/uL — ABNORMAL HIGH (ref 0.00–0.07)
Basophils Absolute: 0.1 10*3/uL (ref 0.0–0.1)
Basophils Relative: 1 %
Eosinophils Absolute: 0 10*3/uL (ref 0.0–0.5)
Eosinophils Relative: 1 %
HCT: 36.8 % (ref 36.0–46.0)
Hemoglobin: 11.9 g/dL — ABNORMAL LOW (ref 12.0–15.0)
Immature Granulocytes: 8 %
Lymphocytes Relative: 28 %
Lymphs Abs: 1.7 10*3/uL (ref 0.7–4.0)
MCH: 30.9 pg (ref 26.0–34.0)
MCHC: 32.3 g/dL (ref 30.0–36.0)
MCV: 95.6 fL (ref 80.0–100.0)
Monocytes Absolute: 0.5 10*3/uL (ref 0.1–1.0)
Monocytes Relative: 9 %
Neutro Abs: 3.1 10*3/uL (ref 1.7–7.7)
Neutrophils Relative %: 53 %
Platelets: 198 10*3/uL (ref 150–400)
RBC: 3.85 MIL/uL — ABNORMAL LOW (ref 3.87–5.11)
RDW: 16.9 % — ABNORMAL HIGH (ref 11.5–15.5)
WBC: 5.9 10*3/uL (ref 4.0–10.5)
nRBC: 1 % — ABNORMAL HIGH (ref 0.0–0.2)

## 2021-04-12 LAB — PROTEIN, URINE, RANDOM: Total Protein, Urine: 17 mg/dL

## 2021-04-12 MED ORDER — SODIUM CHLORIDE 0.9 % IV SOLN
10.0000 mg | Freq: Once | INTRAVENOUS | Status: AC
  Administered 2021-04-12: 10 mg via INTRAVENOUS
  Filled 2021-04-12: qty 10

## 2021-04-12 MED ORDER — SODIUM CHLORIDE 0.9 % IV SOLN
Freq: Once | INTRAVENOUS | Status: AC
Start: 2021-04-12 — End: 2021-04-12
  Filled 2021-04-12: qty 250

## 2021-04-12 MED ORDER — DIPHENHYDRAMINE HCL 50 MG/ML IJ SOLN
25.0000 mg | Freq: Once | INTRAMUSCULAR | Status: AC
Start: 2021-04-12 — End: 2021-04-12
  Administered 2021-04-12: 25 mg via INTRAVENOUS
  Filled 2021-04-12: qty 1

## 2021-04-12 MED ORDER — SODIUM CHLORIDE 0.9 % IV SOLN
150.0000 mg/m2 | Freq: Once | INTRAVENOUS | Status: AC
  Administered 2021-04-12: 280 mg via INTRAVENOUS
  Filled 2021-04-12: qty 10

## 2021-04-12 MED ORDER — PALONOSETRON HCL INJECTION 0.25 MG/5ML
0.2500 mg | Freq: Once | INTRAVENOUS | Status: AC
  Administered 2021-04-12: 0.25 mg via INTRAVENOUS
  Filled 2021-04-12: qty 5

## 2021-04-12 MED ORDER — MONTELUKAST SODIUM 10 MG PO TABS
10.0000 mg | ORAL_TABLET | Freq: Once | ORAL | Status: AC
  Administered 2021-04-12: 10 mg via ORAL
  Filled 2021-04-12: qty 1

## 2021-04-12 MED ORDER — SODIUM CHLORIDE 0.9 % IV SOLN
5.0000 mg/kg | Freq: Once | INTRAVENOUS | Status: AC
  Administered 2021-04-12: 400 mg via INTRAVENOUS
  Filled 2021-04-12: qty 16

## 2021-04-12 MED ORDER — HEPARIN SOD (PORK) LOCK FLUSH 100 UNIT/ML IV SOLN
500.0000 [IU] | Freq: Once | INTRAVENOUS | Status: DC
  Filled 2021-04-12: qty 5

## 2021-04-12 MED ORDER — ATROPINE SULFATE 1 MG/ML IJ SOLN
0.5000 mg | Freq: Once | INTRAMUSCULAR | Status: AC
  Administered 2021-04-12: 0.5 mg via INTRAVENOUS
  Filled 2021-04-12: qty 1

## 2021-04-12 MED ORDER — SODIUM CHLORIDE 0.9 % IV SOLN
2400.0000 mg/m2 | INTRAVENOUS | Status: DC
  Administered 2021-04-12: 4500 mg via INTRAVENOUS
  Filled 2021-04-12: qty 90

## 2021-04-12 MED ORDER — LEUCOVORIN CALCIUM INJECTION 350 MG
399.0000 mg/m2 | Freq: Once | INTRAMUSCULAR | Status: AC
  Administered 2021-04-12: 750 mg via INTRAVENOUS
  Filled 2021-04-12: qty 37.5

## 2021-04-12 MED ORDER — SODIUM CHLORIDE 0.9% FLUSH
10.0000 mL | INTRAVENOUS | Status: DC | PRN
  Administered 2021-04-12: 10 mL via INTRAVENOUS
  Filled 2021-04-12: qty 10

## 2021-04-12 MED ORDER — FLUOROURACIL CHEMO INJECTION 2.5 GM/50ML
400.0000 mg/m2 | Freq: Once | INTRAVENOUS | Status: AC
  Administered 2021-04-12: 750 mg via INTRAVENOUS
  Filled 2021-04-12: qty 15

## 2021-04-12 NOTE — Progress Notes (Signed)
Hematology/Oncology progress note [facility] [phone] [phone]   Patient Care Team: Olin Hauser, DO as PCP - General (Family Medicine) Clent Jacks, RN as Registered Nurse Earlie Server, MD as Consulting Physician (Hematology and Oncology)  REFERRING PROVIDER: Dr.Secord CHIEF COMPLAINTS/REASON FOR VISIT:  Follow up for rectal cancer  HISTORY OF PRESENTING ILLNESS:  Jacqueline Yoder is a  75 y.o.  female with PMH listed below who was referred to me for evaluation of endometrial cancer.   Patient initially presented with complaints of postmenopausal bleeding on 08/16/2018.  History of was menopausal vaginal bleeding in 2016 which resulted in cervical polypectomy.  Pathology 02/04/2015 showed cervical polyp, consistent with benign endometrial polyp.  [name] after polypectomy due to anxiety associated with pelvic exams.  pelvic exam on 08/16/2018 reviewed cervical abnormality and from enlarged uterus. Seen by Dr. Marcelline Mates on 10/29/2018.  Endometrial biopsy and a Pap smear was performed. 10/29/2018 Pap smear showed adenocarcinoma, favor endometrial origin. 10/29/2018 endometrial biopsy showed endometrioid carcinoma, FIGO grade 1.  10/29/2018- TA & TV Ultrasound revealed: Anteverted uterus measuring 8.7 x 5.6 x 6.4 cm without evidence of focal masses.  The endometrium measuring 24.1 mm (thickened) and heterogeneous.  Right and left ovaries not visualized.  No adnexal masses identified.  No free fluid in cul-de-sac.  Patient was seen by Dr. Theora Gianotti in clinic on 11/13/2018.  Cervical exam reveals 2 cm exophytic irregular mass consistent with malignancy.   11/19/2018 CT chest abdomen pelvis with contrast showed thickened endometrium with some irregularity compatible with the provided diagnosis of endometrial malignancy.  There is a mildly prominent left inguinal node 1.4 cm.  Patient was seen by Dr. Fransisca Connors on 11/20/2018 and left groin  lymph node biopsy was recommended.  11/26/2018 patient underwent left inguinal lymph node biopsy. Pathology showed metastatic adenocarcinoma consistent with colorectal origin.  CDX 2+.  Case was discussed on tumor board.  Recommend colonoscopy for further evaluation.  Patient reports significant weight loss 30 pounds over the last year.  Chronic vaginal spotting. Change of bowel habits the past few months.  More constipated.  Family history positive for brother who has colon cancer prostate cancer.  patient has underwent colonoscopy on 12/03/2018 which reviewed a nonobstructing large mass in the rectum.  Also chronic fistula.  Mass was not circumferential.  This was biopsied with a cold forceps for histology.  Pathology came back hyperplastic polyp negative for dysplasia and malignancy. [name], patient underwent flex sigmoidoscopy on 12/06/2018 with rebiopsy of the rectal mass. This time biopsy results came back positive for invasive colorectal adenocarcinoma, moderately differentiated. Immunotherapy for nearly mismatch repair protein (MMR ) was performed.  There is no loss of MMR expression.  low probability of MSI high.   # Seen by [name] for evaluation of resectability for rectal cancer. In addition, she also had a second opinion with [facility] where her endometrial biopsy pathology was changed to  adenocarcinoma, consistent with colorectal primary.   [name]. She has home health that has been assisting with ostomy care  Patient was also evaluated by [facility] oncology.  Recommendation is to proceed with TNT with concurrent chemoradiation followed by neoadjuvant chemotherapy followed by surgical resection. Patient prefers to have treatment done locally with [facility].   # Oncology Treatment:  02/03/2019- 03/19/2019  concurrent Xeloda and radiation.  Xeloda dose 850m /m2 BID - rounded to 16556mBID- on days of  radiation. 04/09/2019, started  on FOLFOX with bolus 5-FU omitted.  07/16/2019 finished 8 cycles of FOLFOX. 09/17/19 APR/posterior vaginectomy/TAH/BSO/VY-flap pT4b pN0 with close vaginal margin 0.2 mm.  Uterus and ovaries negative for malignancy. Patient reports bilateral lower extremity numbness and tingling, intermittent, left worse than right. She has lost a lot of weight since her APR surgery.   #Family history with half brother having's history of colon cancer prostate cancer.  Personal history of colorectal cancer.  Patient has not decided if she wants genetic testing.    # history of PE( 01/13/2020)  in the bilateral lower extremity DVT (01/13/2020).   She finishes 6 months of anticoagulation with Eliquis 5 mg twice daily. Now switched to Eliquis 2.5 mg twice daily..  # She has now developed recurrent disease. #06/30/20  vaginal introitus mass biopsied. Pathology is consistent with metastatic colorectal adenocarcinoma I have discussed with [name]  Dr. Hester Mates and the mass is not resectable. Patient has also had colonoscopy by Dr. Vicente Males yesterday. Normal examination. # 07/16/2020 cycle 1 FOLFIRI  # 07/20/2020 PET scan was done for further evaluation, images are consistent with local recurrence, no distant metastasis. #Discussed with radiation oncology Dr. Baruch Gouty will recommends concurrent chemotherapy and radiation. 08/02/2020-08/16/2020, patient starts radiation.  Xeloda was held due to neutropenia 08/17/2020,-09/06/2020 Xeloda 1500 mg twice daily concurrently with radiation  01/31/21 start FOLFIRI + Bev   INTERVAL HISTORY Jacqueline Yoder is a 75 y.o. female who has above history reviewed by me presents for follow-up of rectal cancer. She tolerates chemotheray well. Managable diarrhea.  She has gained 4 pounds since last visit.  Denies any abdominal pain, nausea vomiting    Review of Systems  Constitutional:  Positive for appetite change. Negative for chills, fatigue, fever and  unexpected weight change.  HENT:   Negative for hearing loss and voice change.   Eyes:  Negative for eye problems.  Respiratory:  Negative for chest tightness and cough.   Cardiovascular:  Negative for chest pain.  Gastrointestinal:  Negative for abdominal distention, abdominal pain, blood in stool, constipation, diarrhea and nausea.  Endocrine: Negative for hot flashes.  Genitourinary:  Negative for difficulty urinating and frequency.   Musculoskeletal:  Negative for arthralgias.  Skin:  Negative for itching and rash.  Neurological:  Negative for extremity weakness and numbness.  Hematological:  Negative for adenopathy.  Psychiatric/Behavioral:  Negative for confusion.    MEDICAL HISTORY:  Past Medical History:  Diagnosis Date   Allergy    Arthritis    Blood clot in vein    Family history of colon cancer    GERD (gastroesophageal reflux disease)    Hypercholesteremia    Hypertension    Hypertension    Lower extremity edema    Rectal cancer ([place]) 12/2018   Urinary incontinence     SURGICAL HISTORY: Past Surgical History:  Procedure Laterality Date   ABDOMINAL HYSTERECTOMY     CHOLECYSTECTOMY  1971   COLONOSCOPY WITH PROPOFOL N/A 12/03/2018   Procedure: COLONOSCOPY WITH PROPOFOL;  Surgeon: Lucilla Lame, MD;  Location: [facility] ENDOSCOPY;  Service: Endoscopy;  Laterality: N/A;   COLONOSCOPY WITH PROPOFOL N/A 07/15/2020   Procedure: COLONOSCOPY WITH PROPOFOL;  Surgeon: [name], MD;  Location: [facility] ENDOSCOPY;  Service: Gastroenterology;  Laterality: N/A;   FLEXIBLE SIGMOIDOSCOPY N/A 12/06/2018   Procedure: FLEXIBLE SIGMOIDOSCOPY;  Surgeon: [name], MD;  Location: [facility] ENDOSCOPY;  Service: Endoscopy;  Laterality: N/A;   LAPAROSCOPIC COLOSTOMY  01/06/2019   PORTACATH PLACEMENT N/A 04/03/2019   Procedure: INSERTION PORT-A-CATH;  Surgeon:  Jules Husbands, MD;  Location: [facility] ORS;  Service: General;  Laterality: N/A;    SOCIAL HISTORY: Social History   Socioeconomic History    Marital status: Married    Spouse name: Not on file   Number of children: Not on file   Years of education: Not on file   Highest education level: Not on file  Occupational History   Not on file  Tobacco Use   Smoking status: Former    Pack years: 0.00    Types: Cigarettes    Quit date: 12/02/1977    Years since quitting: 43.3   Smokeless tobacco: Former  Scientific laboratory technician Use: Never used  Substance and Sexual Activity   Alcohol use: Never   Drug use: Never   Sexual activity: Not Currently    Birth control/protection: None  Other Topics Concern   Not on file  Social History Narrative   Lives with daughter   Social Determinants of Health   Financial Resource Strain: Not on file  Food Insecurity: Not on file  Transportation Needs: Not on file  Physical Activity: Not on file  Stress: Not on file  Social Connections: Not on file  Intimate Partner Violence: Not on file    FAMILY HISTORY: Family History  Problem Relation Age of Onset   Colon cancer Brother 81       exposure to chemicals [place]   Hypertension Mother    Stroke Mother    Kidney failure Father    Breast cancer Neg Hx    Ovarian cancer Neg Hx     ALLERGIES:  is allergic to sulfamethoxazole-trimethoprim.  MEDICATIONS:  Current Outpatient Medications  Medication Sig Dispense Refill   apixaban (ELIQUIS) 2.5 MG TABS tablet Take 1 tablet (2.5 mg total) by mouth 2 (two) times daily. 60 tablet 5   Cholecalciferol (VITAMIN D3) 2000 units capsule Take 2,000 Units by mouth daily.     diclofenac sodium (VOLTAREN) 1 % GEL Apply 2 g topically 4 (four) times daily as needed (joint pain).  11   docusate sodium (COLACE) 100 MG capsule Take 100 mg by mouth 2 (two) times daily as needed.     fexofenadine (ALLEGRA) 180 MG tablet Take 180 mg by mouth daily. (Patient not taking: Reported on 03/29/2021)     fluticasone (FLONASE) 50 MCG/ACT nasal spray Place 1 spray into both nostrils daily. 16 g 1   gabapentin (NEURONTIN)  100 MG capsule Take 1 capsule (100 mg total) by mouth at bedtime. 30 capsule 0   ipratropium (ATROVENT) 0.03 % nasal spray Place 1 spray into both nostrils 2 (two) times daily. 30 mL 2   lidocaine-prilocaine (EMLA) cream Apply 1 application topically as needed. 30 g 6   loperamide (IMODIUM) 2 MG capsule Take 1 capsule (2 mg total) by mouth See admin instructions. With onset of loose stool, take 52m followed by 283mevery 2 hours,  Maximum: 16 mg/day (Patient not taking: Reported on 03/29/2021) 120 capsule 1   loratadine (CLARITIN) 10 MG tablet Take 10 mg by mouth daily.     Multiple Vitamins-Minerals (ONE-A-DAY WOMENS 50 PLUS PO) Take 1 tablet by mouth daily.      ondansetron (ZOFRAN) 8 MG tablet Take 1 tablet (8 mg total) by mouth 2 (two) times daily as needed for refractory nausea / vomiting. Start on day 3 after chemotherapy. 30 tablet 1   potassium chloride SA (KLOR-CON) 20 MEQ tablet Take 1 tablet (20 mEq total) by mouth daily. 30 tablet  0   prochlorperazine (COMPAZINE) 10 MG tablet Take 1 tablet (10 mg total) by mouth every 6 (six) hours as needed (NAUSEA). 30 tablet 1   simvastatin (ZOCOR) 40 MG tablet Take 40 mg by mouth at bedtime.      triamterene-hydrochlorothiazide (DYAZIDE) 37.5-25 MG capsule Take 1 capsule by mouth daily.      zinc gluconate 50 MG tablet Take 50 mg by mouth daily.     No current facility-administered medications for this visit.   Facility-Administered Medications Ordered in Other Visits  Medication Dose Route Frequency Provider Last Rate Last Admin   heparin lock flush 100 unit/mL  500 Units Intravenous Once Earlie Server, MD       sodium chloride flush (NS) 0.9 % injection 10 mL  10 mL Intravenous PRN Earlie Server, MD         PHYSICAL EXAMINATION: ECOG PERFORMANCE STATUS: 1 - Symptomatic but completely ambulatory  Physical Exam Constitutional:      General: She is not in acute distress. HENT:     Head: Normocephalic and atraumatic.  Eyes:     General: No scleral  icterus. Cardiovascular:     Rate and Rhythm: Normal rate and regular rhythm.     Heart sounds: Normal heart sounds.  Pulmonary:     Effort: Pulmonary effort is normal. No respiratory distress.     Breath sounds: No wheezing.  Abdominal:     General: Bowel sounds are normal. There is no distension.     Palpations: Abdomen is soft.     Comments: + Colostomy bag   Musculoskeletal:        General: No deformity. Normal range of motion.     Cervical back: Normal range of motion and neck supple.  Skin:    General: Skin is warm and dry.     Findings: No erythema or rash.  Neurological:     Mental Status: She is alert and oriented to person, place, and time. Mental status is at baseline.     Cranial Nerves: No cranial nerve deficit.     Coordination: Coordination normal.  Psychiatric:     Comments: Grieving   CMP Latest Ref Rng & Units 03/29/2021  Glucose 70 - 99 mg/dL 102(H)  BUN 8 - 23 mg/dL 8  Creatinine 0.44 - 1.00 mg/dL 0.98  Sodium 135 - 145 mmol/L 137  Potassium 3.5 - 5.1 mmol/L 3.5  Chloride 98 - 111 mmol/L 99  CO2 22 - 32 mmol/L 28  Calcium 8.9 - 10.3 mg/dL 9.4  Total Protein 6.5 - 8.1 g/dL 7.3  Total Bilirubin 0.3 - 1.2 mg/dL 0.6  Alkaline Phos 38 - 126 U/L 71  AST 15 - 41 U/L 27  ALT 0 - 44 U/L 15   CBC Latest Ref Rng & Units 03/29/2021  WBC 4.0 - 10.5 K/uL 5.3  Hemoglobin 12.0 - 15.0 g/dL 12.5  Hematocrit 36.0 - 46.0 % 37.6  Platelets 150 - 400 K/uL 199    LABORATORY DATA:  I have reviewed the data as listed Lab Results  Component Value Date   WBC 5.3 03/29/2021   HGB 12.5 03/29/2021   HCT 37.6 03/29/2021   MCV 93.5 03/29/2021   PLT 199 03/29/2021   Recent Labs    06/23/20 1124 07/16/20 0802 07/23/20 1345 02/28/21 0913 03/15/21 0804 03/29/21 0836  NA 140 140   < > 136 138 137  K 3.7 3.2*   < > 3.7 3.8 3.5  CL 100 101   < >  100 101 99  CO2 30 29   < > '27 27 28  ' GLUCOSE 114* 114*   < > 97 107* 102*  BUN 10 9   < > '11 11 8  ' CREATININE 0.97 0.87   <  > 0.90 0.80 0.98  CALCIUM 9.1 9.2   < > 9.4 9.5 9.4  GFRNONAA 58* >60   < > >60 >60 >60  GFRAA >60 >60  --   --   --   --   PROT 7.0 6.9   < > 7.1 7.1 7.3  ALBUMIN 4.0 3.6   < > 4.2 4.1 4.3  AST 25 24   < > 37 41 27  ALT 15 14   < > '24 27 15  ' ALKPHOS 41 41   < > 54 65 71  BILITOT 0.4 0.5   < > 0.7 0.4 0.6   < > = values in this interval not displayed.    Iron/TIBC/Ferritin/ %Sat    Component Value Date/Time   IRON 40 06/23/2020 1124   TIBC 246 (L) 06/23/2020 1124   FERRITIN 338 (H) 06/23/2020 1124   IRONPCTSAT 16 06/23/2020 1124     RADIOGRAPHIC STUDIES: I have personally reviewed the radiological images as listed and agreed with the findings in the report. CT CHEST ABDOMEN PELVIS W CONTRAST  Result Date: 01/20/2021 CLINICAL DATA:  Restaging rectal cancer diagnosed 03/05/2019. Status post APR with vaginal recurrence. EXAM: CT CHEST, ABDOMEN, AND PELVIS WITH CONTRAST TECHNIQUE: Multidetector CT imaging of the chest, abdomen and pelvis was performed following the standard protocol during bolus administration of intravenous contrast. CONTRAST:  155m OMNIPAQUE IOHEXOL 300 MG/ML  SOLN COMPARISON:  Multiple priors including PET-CT July 20, 2020 and CT chest abdomen and pelvis October 20, 2020 FINDINGS: CT CHEST FINDINGS Cardiovascular: Right chest wall Port-A-Cath with tip in the right atrium. Atherosclerotic thoracic aortic calcifications. No thoracic aortic aneurysm. No central pulmonary embolus. Normal size heart. No significant pericardial effusion/thickening. Mediastinum/Nodes: No discrete thyroid nodule. The trachea and esophagus are unremarkable. No pathologically enlarged mediastinal, hilar or axillary lymph nodes. Lungs/Pleura: There are multiple scattered new and/or enlarging bilateral pulmonary nodules. For instance: Increased size of the left upper lobe pulmonary nodule which now measures 4 mm on image 37/4 previously 3 mm. Increased size of a right upper lobe pulmonary nodule which  now measures 3 mm on image 47/4 previously 2 mm. Increased size of the a right middle lobe pulmonary nodule which now measures 8 mm previously 6 mm. New 2 mm left upper lobe pulmonary nodule on image 21/4. New 3 mm right upper lobe pulmonary nodule on image 68/4. No pleural effusion.  No pneumothorax.  No focal consolidation. Musculoskeletal: No aggressive lytic or blastic lesions of bone. CT ABDOMEN PELVIS FINDINGS Hepatobiliary: No suspicious hepatic lesions. Gallbladder is surgically absent. No biliary ductal dilation. Pancreas: Within normal limits. Spleen: Within normal limits. Adrenals/Urinary Tract: Bilateral adrenal glands are unremarkable. No hydronephrosis. Bilateral renal cysts. No solid enhancing renal lesions. Urinary bladder is grossly unremarkable for degree of distension Stomach/Bowel: The stomach and small bowel are grossly unremarkable. The terminal ileum and appendix are both unremarkable. No acute inflammatory process or obstructive findings. Stable surgical changes of APR and left lower quadrant colostomy. There is mild colonic wall thickening involving the distal transverse colon at the splenic flexure which appears similar to prior and may be related to under distension. Colonic diverticulosis without findings of acute diverticulitis. Vascular/Lymphatic: Aortic and branch vessel atherosclerosis. No gastrohepatic or hepatoduodenal  ligament lymphadenopathy. No retroperitoneal or mesenteric lymphadenopathy. No pelvic sidewall lymphadenopathy. New mildly enlarged bilateral inguinal lymph nodes which measures 1.2 cm on the right on image 104/3 and 1.4 cm on the left on image 102/3. Reproductive: Surgically absent Other: Persistent moderate vaginal wall thickening without discrete mass visualized. Musculoskeletal: Multilevel degenerative changes spine. Post radiation change in the pelvis and sacrum. No aggressive lytic or blastic lesion of bone. IMPRESSION: 1. Multiple scattered new and/or enlarging  bilateral pulmonary nodules, consistent with pulmonary metastatic disease. 2. Persistent moderate vaginal wall thickening without discrete mass. New mildly enlarged bilateral inguinal lymph nodes, nonspecific but concerning for disease involvement. 3. Mild colonic wall thickening involving the distal transverse colon at the splenic flexure which appears similar to prior and may be related to under distension. Consider attention on follow-up imaging versus colonoscopy. 4. Stable surgical changes of APR and left lower quadrant colostomy. 5. Aortic atherosclerosis. Aortic Atherosclerosis (ICD10-I70.0). Electronically Signed   By: Dahlia Bailiff MD   On: 01/20/2021 11:23     CT CHEST ABDOMEN PELVIS W CONTRAST  Result Date: 01/20/2021 CLINICAL DATA:  Restaging rectal cancer diagnosed 03/05/2019. Status post APR with vaginal recurrence. EXAM: CT CHEST, ABDOMEN, AND PELVIS WITH CONTRAST TECHNIQUE: Multidetector CT imaging of the chest, abdomen and pelvis was performed following the standard protocol during bolus administration of intravenous contrast. CONTRAST:  110m OMNIPAQUE IOHEXOL 300 MG/ML  SOLN COMPARISON:  Multiple priors including PET-CT July 20, 2020 and CT chest abdomen and pelvis October 20, 2020 FINDINGS: CT CHEST FINDINGS Cardiovascular: Right chest wall Port-A-Cath with tip in the right atrium. Atherosclerotic thoracic aortic calcifications. No thoracic aortic aneurysm. No central pulmonary embolus. Normal size heart. No significant pericardial effusion/thickening. Mediastinum/Nodes: No discrete thyroid nodule. The trachea and esophagus are unremarkable. No pathologically enlarged mediastinal, hilar or axillary lymph nodes. Lungs/Pleura: There are multiple scattered new and/or enlarging bilateral pulmonary nodules. For instance: Increased size of the left upper lobe pulmonary nodule which now measures 4 mm on image 37/4 previously 3 mm. Increased size of a right upper lobe pulmonary nodule which now  measures 3 mm on image 47/4 previously 2 mm. Increased size of the a right middle lobe pulmonary nodule which now measures 8 mm previously 6 mm. New 2 mm left upper lobe pulmonary nodule on image 21/4. New 3 mm right upper lobe pulmonary nodule on image 68/4. No pleural effusion.  No pneumothorax.  No focal consolidation. Musculoskeletal: No aggressive lytic or blastic lesions of bone. CT ABDOMEN PELVIS FINDINGS Hepatobiliary: No suspicious hepatic lesions. Gallbladder is surgically absent. No biliary ductal dilation. Pancreas: Within normal limits. Spleen: Within normal limits. Adrenals/Urinary Tract: Bilateral adrenal glands are unremarkable. No hydronephrosis. Bilateral renal cysts. No solid enhancing renal lesions. Urinary bladder is grossly unremarkable for degree of distension Stomach/Bowel: The stomach and small bowel are grossly unremarkable. The terminal ileum and appendix are both unremarkable. No acute inflammatory process or obstructive findings. Stable surgical changes of APR and left lower quadrant colostomy. There is mild colonic wall thickening involving the distal transverse colon at the splenic flexure which appears similar to prior and may be related to under distension. Colonic diverticulosis without findings of acute diverticulitis. Vascular/Lymphatic: Aortic and branch vessel atherosclerosis. No gastrohepatic or hepatoduodenal ligament lymphadenopathy. No retroperitoneal or mesenteric lymphadenopathy. No pelvic sidewall lymphadenopathy. New mildly enlarged bilateral inguinal lymph nodes which measures 1.2 cm on the right on image 104/3 and 1.4 cm on the left on image 102/3. Reproductive: Surgically absent Other: Persistent moderate vaginal wall thickening  without discrete mass visualized. Musculoskeletal: Multilevel degenerative changes spine. Post radiation change in the pelvis and sacrum. No aggressive lytic or blastic lesion of bone. IMPRESSION: 1. Multiple scattered new and/or enlarging  bilateral pulmonary nodules, consistent with pulmonary metastatic disease. 2. Persistent moderate vaginal wall thickening without discrete mass. New mildly enlarged bilateral inguinal lymph nodes, nonspecific but concerning for disease involvement. 3. Mild colonic wall thickening involving the distal transverse colon at the splenic flexure which appears similar to prior and may be related to under distension. Consider attention on follow-up imaging versus colonoscopy. 4. Stable surgical changes of APR and left lower quadrant colostomy. 5. Aortic atherosclerosis. Aortic Atherosclerosis (ICD10-I70.0). Electronically Signed   By: Dahlia Bailiff MD   On: 01/20/2021 11:23     ASSESSMENT & PLAN:  1. Encounter for antineoplastic chemotherapy   2. Rectal cancer ([place])   3. Chemotherapy induced neutropenia ([facility])   4. Chemotherapy induced diarrhea   5. History of DVT (deep vein thrombosis)   6. History of pulmonary embolism   7. Hypokalemia   Cancer Staging Rectal cancer [facility]) Staging form: Colon and Rectum, AJCC 8th Edition - Clinical stage from 01/23/2019: Stage IIIC (cT4b, cN1a, cM0) - Signed by Earlie Server, MD on 01/23/2019 - Pathologic stage from 10/06/2019: Stage IIC (ypT4b, pN0, cM0) - Signed by Earlie Server, MD on 10/06/2019 - Pathologic: Stage Unknown (rpTX, pNX, cM1) - Signed by Earlie Server, MD on 01/31/2021  #History of stage IIIC Rectal cancer, s/p TNT, followed by 09/17/19 APR/posterior vaginectomy/TAH/BSO/VY-flap, pT4b pN0 with close vaginal margin 0.2 mm.  Uterus and ovaries negative for malignancy. palliative radiation to vaginal recurrence.  Finished 09/04/2020 01/19/21 recurrence with lung metastasis.-Palliative chemotherapy Labs are reviewed and discussed with patient. Proceed with FOLFIRI/bevacizumab chemotherapy- Irinotecan 150 mg/m. -Neulasta on day 3.  take Claritin for 4 days.  #Grieving, emotional support was provided. #Chemotherapy-induced neutropenia, see above.  On GCSF    #Chemotherapy-induced diarrhea, symptoms are stable.  As needed antidiarrhea medications.Marland Kitchen  #Hypokalemia, potassium 3.5, continue  potassium chloride 20 mEq daily.    #History of pulmonary embolism, history of bilateral DVT Continue Eliquis 2.5 mg twice daily.  #Chemotherapy-induced hives, recommend patient to take Benadryl and Pepcid at home. Add Singulair to premedication.  Follow-up 2 weeks for next cycle of treatment.  Earlie Server, MD, PhD

## 2021-04-12 NOTE — Progress Notes (Signed)
Pt noted to have hives on chest, pt denies any other symptoms including but not limited to itching, difficulty breathing, pain, swelling. No s/s of distress noted. Dr. Tasia Catchings aware. Per Dr. Tasia Catchings give 25MG  IV Benadryl post irinotecan, complete treatment and discharge pt home. Pt agrees with plans and reports that she will follow the home medications given to her at past treatments by NPs (benadryl 25 mg PO every 8 hours and Pepcid 20 mg PO every 12 hours). Pt educated when to seek emergency care. Pt verbalizes understanding. Pt tolerated remainder of treatment well. Pt and VS stable at discharge.

## 2021-04-12 NOTE — Patient Instructions (Signed)
[facility]  Discharge Instructions: Thank you for choosing [facility] to provide your oncology and hematology care.  If you have a lab appointment with the [facility], please go directly to the [facility] and check in at the registration area.  Wear comfortable clothing and clothing appropriate for easy access to any Portacath or PICC line.   We strive to give you quality time with your provider. You may need to reschedule your appointment if you arrive late (15 or more minutes).  Arriving late affects you and other patients whose appointments are after yours.  Also, if you miss three or more appointments without notifying the office, you may be dismissed from the clinic at the provider's discretion.      For prescription refill requests, have your pharmacy contact our office and allow 72 hours for refills to be completed.    Today you received the following chemotherapy and/or immunotherapy agents Mvasi, Irinotecan, Leucovorin and Adrucil       To help prevent nausea and vomiting after your treatment, we encourage you to take your nausea medication as directed.  BELOW ARE SYMPTOMS THAT SHOULD BE REPORTED IMMEDIATELY: *FEVER GREATER THAN 100.4 F (38 C) OR HIGHER *CHILLS OR SWEATING *NAUSEA AND VOMITING THAT IS NOT CONTROLLED WITH YOUR NAUSEA MEDICATION *UNUSUAL SHORTNESS OF BREATH *UNUSUAL BRUISING OR BLEEDING *URINARY PROBLEMS (pain or burning when urinating, or frequent urination) *BOWEL PROBLEMS (unusual diarrhea, constipation, pain near the anus) TENDERNESS IN MOUTH AND THROAT WITH OR WITHOUT PRESENCE OF ULCERS (sore throat, sores in mouth, or a toothache) UNUSUAL RASH, SWELLING OR PAIN  UNUSUAL VAGINAL DISCHARGE OR ITCHING   Items with * indicate a potential emergency and should be followed up as soon as possible or go to the Emergency Department if any problems should occur.  Please show the CHEMOTHERAPY ALERT CARD or  IMMUNOTHERAPY ALERT CARD at check-in to the Emergency Department and triage nurse.  Should you have questions after your visit or need to cancel or reschedule your appointment, please contact [facility]  [phone] and follow the prompts.  Office hours are 8:00 a.m. to 4:30 p.m. Monday - Friday. Please note that voicemails left after 4:00 p.m. may not be returned until the following business day.  We are closed weekends and major holidays. You have access to a nurse at all times for urgent questions. Please call the main number to the clinic [phone] and follow the prompts.  For any non-urgent questions, you may also contact your provider using MyChart. We now offer e-Visits for anyone 74 and older to request care online for non-urgent symptoms. For details visit [email or web address].   Also download the MyChart app! Go to the app store, search "MyChart", open the app, select [facility], and log in with your MyChart username and password.  Due to Covid, a mask is required upon entering the hospital/clinic. If you do not have a mask, one will be given to you upon arrival. For doctor visits, patients may have 1 support person aged 74 or older with them. For treatment visits, patients cannot have anyone with them due to current Covid guidelines and our immunocompromised population.

## 2021-04-14 ENCOUNTER — Encounter: Payer: Self-pay | Admitting: Oncology

## 2021-04-14 ENCOUNTER — Inpatient Hospital Stay: Payer: Medicare Other

## 2021-04-14 ENCOUNTER — Other Ambulatory Visit: Payer: Self-pay

## 2021-04-14 VITALS — BP 111/55 | HR 66 | Temp 97.0°F | Resp 17

## 2021-04-14 DIAGNOSIS — Z86711 Personal history of pulmonary embolism: Secondary | ICD-10-CM | POA: Diagnosis not present

## 2021-04-14 DIAGNOSIS — C799 Secondary malignant neoplasm of unspecified site: Secondary | ICD-10-CM

## 2021-04-14 DIAGNOSIS — Z7901 Long term (current) use of anticoagulants: Secondary | ICD-10-CM

## 2021-04-14 DIAGNOSIS — C7982 Secondary malignant neoplasm of genital organs: Secondary | ICD-10-CM | POA: Diagnosis not present

## 2021-04-14 DIAGNOSIS — Z5189 Encounter for other specified aftercare: Secondary | ICD-10-CM | POA: Diagnosis not present

## 2021-04-14 DIAGNOSIS — I825Z3 Chronic embolism and thrombosis of unspecified deep veins of distal lower extremity, bilateral: Secondary | ICD-10-CM

## 2021-04-14 DIAGNOSIS — K219 Gastro-esophageal reflux disease without esophagitis: Secondary | ICD-10-CM | POA: Diagnosis not present

## 2021-04-14 DIAGNOSIS — C20 Malignant neoplasm of rectum: Secondary | ICD-10-CM | POA: Diagnosis not present

## 2021-04-14 DIAGNOSIS — E7800 Pure hypercholesterolemia, unspecified: Secondary | ICD-10-CM | POA: Diagnosis not present

## 2021-04-14 DIAGNOSIS — I10 Essential (primary) hypertension: Secondary | ICD-10-CM | POA: Diagnosis not present

## 2021-04-14 DIAGNOSIS — Z86718 Personal history of other venous thrombosis and embolism: Secondary | ICD-10-CM | POA: Diagnosis not present

## 2021-04-14 DIAGNOSIS — Z5111 Encounter for antineoplastic chemotherapy: Secondary | ICD-10-CM | POA: Diagnosis not present

## 2021-04-14 DIAGNOSIS — Z79899 Other long term (current) drug therapy: Secondary | ICD-10-CM | POA: Diagnosis not present

## 2021-04-14 MED ORDER — PROCHLORPERAZINE MALEATE 10 MG PO TABS: 10.0000 mg | ORAL_TABLET | Freq: Four times a day (QID) | ORAL | 1 refills | Status: DC | PRN

## 2021-04-14 MED ORDER — APIXABAN 2.5 MG PO TABS: 2.5000 mg | ORAL_TABLET | Freq: Two times a day (BID) | ORAL | 1 refills | Status: DC

## 2021-04-14 MED ORDER — HEPARIN SOD (PORK) LOCK FLUSH 100 UNIT/ML IV SOLN
500.0000 [IU] | Freq: Once | INTRAVENOUS | Status: AC | PRN
Start: 2021-04-14 — End: 2021-04-14
  Administered 2021-04-14: 500 [IU]
  Filled 2021-04-14: qty 5

## 2021-04-14 MED ORDER — ONDANSETRON HCL 8 MG PO TABS: 8.0000 mg | ORAL_TABLET | Freq: Two times a day (BID) | ORAL | 1 refills | Status: DC | PRN

## 2021-04-14 MED ORDER — PEGFILGRASTIM 6 MG/0.6ML ~~LOC~~ PSKT
6.0000 mg | PREFILLED_SYRINGE | Freq: Once | SUBCUTANEOUS | Status: AC
  Administered 2021-04-14: 6 mg via SUBCUTANEOUS
  Filled 2021-04-14: qty 0.6

## 2021-04-14 MED ORDER — GABAPENTIN 100 MG PO CAPS: 100.0000 mg | ORAL_CAPSULE | Freq: Every day | ORAL | 0 refills | Status: DC

## 2021-04-14 MED ORDER — LOPERAMIDE HCL 2 MG PO CAPS
2.0000 mg | ORAL_CAPSULE | ORAL | 1 refills | Status: DC
Start: 2021-04-14 — End: 2023-09-16

## 2021-04-14 MED ORDER — POTASSIUM CHLORIDE CRYS ER 20 MEQ PO TBCR: 20.0000 meq | EXTENDED_RELEASE_TABLET | Freq: Every day | ORAL | 0 refills | Status: DC

## 2021-04-14 MED ORDER — HEPARIN SOD (PORK) LOCK FLUSH 100 UNIT/ML IV SOLN
INTRAVENOUS | Status: AC
  Filled 2021-04-14: qty 5

## 2021-04-14 MED ORDER — SODIUM CHLORIDE 0.9% FLUSH
10.0000 mL | INTRAVENOUS | Status: DC | PRN
  Administered 2021-04-14: 10 mL
  Filled 2021-04-14: qty 10

## 2021-04-14 NOTE — Progress Notes (Signed)
Spoke to Jacqueline Yoder who states that she is receiving help with her medication and prescriptions will need to e sent to [facility]. Medications prescribed by Dr. Tasia Catchings have been routed to this pharmacy per pt request.

## 2021-04-26 ENCOUNTER — Inpatient Hospital Stay: Payer: Medicare Other | Attending: Oncology | Admitting: Oncology

## 2021-04-26 ENCOUNTER — Inpatient Hospital Stay: Payer: Medicare Other

## 2021-04-26 ENCOUNTER — Encounter: Payer: Self-pay | Admitting: Oncology

## 2021-04-26 VITALS — BP 135/60 | HR 91 | Temp 96.1°F | Resp 18 | Wt 173.2 lb

## 2021-04-26 DIAGNOSIS — C7800 Secondary malignant neoplasm of unspecified lung: Secondary | ICD-10-CM | POA: Insufficient documentation

## 2021-04-26 DIAGNOSIS — Z5111 Encounter for antineoplastic chemotherapy: Secondary | ICD-10-CM

## 2021-04-26 DIAGNOSIS — E876 Hypokalemia: Secondary | ICD-10-CM | POA: Diagnosis not present

## 2021-04-26 DIAGNOSIS — Z5112 Encounter for antineoplastic immunotherapy: Secondary | ICD-10-CM | POA: Insufficient documentation

## 2021-04-26 DIAGNOSIS — D701 Agranulocytosis secondary to cancer chemotherapy: Secondary | ICD-10-CM | POA: Insufficient documentation

## 2021-04-26 DIAGNOSIS — T451X5A Adverse effect of antineoplastic and immunosuppressive drugs, initial encounter: Secondary | ICD-10-CM | POA: Insufficient documentation

## 2021-04-26 DIAGNOSIS — Z90722 Acquired absence of ovaries, bilateral: Secondary | ICD-10-CM | POA: Diagnosis not present

## 2021-04-26 DIAGNOSIS — Z7901 Long term (current) use of anticoagulants: Secondary | ICD-10-CM | POA: Diagnosis not present

## 2021-04-26 DIAGNOSIS — R970 Elevated carcinoembryonic antigen [CEA]: Secondary | ICD-10-CM | POA: Diagnosis not present

## 2021-04-26 DIAGNOSIS — Z86718 Personal history of other venous thrombosis and embolism: Secondary | ICD-10-CM | POA: Diagnosis not present

## 2021-04-26 DIAGNOSIS — C20 Malignant neoplasm of rectum: Secondary | ICD-10-CM | POA: Insufficient documentation

## 2021-04-26 DIAGNOSIS — L500 Allergic urticaria: Secondary | ICD-10-CM | POA: Insufficient documentation

## 2021-04-26 DIAGNOSIS — K521 Toxic gastroenteritis and colitis: Secondary | ICD-10-CM | POA: Diagnosis not present

## 2021-04-26 DIAGNOSIS — Z90710 Acquired absence of both cervix and uterus: Secondary | ICD-10-CM | POA: Diagnosis not present

## 2021-04-26 DIAGNOSIS — C799 Secondary malignant neoplasm of unspecified site: Secondary | ICD-10-CM | POA: Diagnosis not present

## 2021-04-26 DIAGNOSIS — Z923 Personal history of irradiation: Secondary | ICD-10-CM | POA: Diagnosis not present

## 2021-04-26 DIAGNOSIS — Z86711 Personal history of pulmonary embolism: Secondary | ICD-10-CM | POA: Diagnosis not present

## 2021-04-26 LAB — PROTEIN, URINE, RANDOM: Total Protein, Urine: 23 mg/dL

## 2021-04-26 LAB — CBC WITH DIFFERENTIAL/PLATELET
Abs Immature Granulocytes: 0.48 10*3/uL — ABNORMAL HIGH (ref 0.00–0.07)
Basophils Absolute: 0.1 10*3/uL (ref 0.0–0.1)
Basophils Relative: 1 %
Eosinophils Absolute: 0.1 10*3/uL (ref 0.0–0.5)
Eosinophils Relative: 2 %
HCT: 37.2 % (ref 36.0–46.0)
Hemoglobin: 11.9 g/dL — ABNORMAL LOW (ref 12.0–15.0)
Immature Granulocytes: 7 %
Lymphocytes Relative: 27 %
Lymphs Abs: 1.9 10*3/uL (ref 0.7–4.0)
MCH: 30.7 pg (ref 26.0–34.0)
MCHC: 32 g/dL (ref 30.0–36.0)
MCV: 96.1 fL (ref 80.0–100.0)
Monocytes Absolute: 0.7 10*3/uL (ref 0.1–1.0)
Monocytes Relative: 10 %
Neutro Abs: 3.6 10*3/uL (ref 1.7–7.7)
Neutrophils Relative %: 53 %
Platelets: 199 10*3/uL (ref 150–400)
RBC: 3.87 MIL/uL (ref 3.87–5.11)
RDW: 17.7 % — ABNORMAL HIGH (ref 11.5–15.5)
WBC: 6.8 10*3/uL (ref 4.0–10.5)
nRBC: 1 % — ABNORMAL HIGH (ref 0.0–0.2)

## 2021-04-26 LAB — COMPREHENSIVE METABOLIC PANEL
ALT: 18 U/L (ref 0–44)
AST: 29 U/L (ref 15–41)
Albumin: 4.1 g/dL (ref 3.5–5.0)
Alkaline Phosphatase: 74 U/L (ref 38–126)
Anion gap: 10 (ref 5–15)
BUN: 8 mg/dL (ref 8–23)
CO2: 26 mmol/L (ref 22–32)
Calcium: 9.3 mg/dL (ref 8.9–10.3)
Chloride: 101 mmol/L (ref 98–111)
Creatinine, Ser: 0.92 mg/dL (ref 0.44–1.00)
GFR, Estimated: >60 mL/min (ref >60)
Glucose, Bld: 100 mg/dL — ABNORMAL HIGH (ref 70–99)
Potassium: 3.5 mmol/L (ref 3.5–5.1)
Sodium: 137 mmol/L (ref 135–145)
Total Bilirubin: 0.5 mg/dL (ref 0.3–1.2)
Total Protein: 7 g/dL (ref 6.5–8.1)

## 2021-04-26 MED ORDER — ATROPINE SULFATE 1 MG/ML IJ SOLN
0.5000 mg | Freq: Once | INTRAMUSCULAR | Status: AC
  Administered 2021-04-26: 0.5 mg via INTRAVENOUS
  Filled 2021-04-26: qty 1

## 2021-04-26 MED ORDER — SODIUM CHLORIDE 0.9 % IV SOLN
10.0000 mg | Freq: Once | INTRAVENOUS | Status: AC
  Administered 2021-04-26: 10 mg via INTRAVENOUS
  Filled 2021-04-26: qty 10

## 2021-04-26 MED ORDER — SODIUM CHLORIDE 0.9 % IV SOLN
2400.0000 mg/m2 | INTRAVENOUS | Status: DC
  Administered 2021-04-26: 4500 mg via INTRAVENOUS
  Filled 2021-04-26: qty 90

## 2021-04-26 MED ORDER — SODIUM CHLORIDE 0.9% FLUSH
10.0000 mL | Freq: Once | INTRAVENOUS | Status: AC
Start: 2021-04-26 — End: 2021-04-26
  Administered 2021-04-26: 10 mL via INTRAVENOUS
  Filled 2021-04-26: qty 10

## 2021-04-26 MED ORDER — DIPHENHYDRAMINE HCL 50 MG/ML IJ SOLN
INTRAMUSCULAR | Status: AC
  Filled 2021-04-26: qty 1

## 2021-04-26 MED ORDER — SODIUM CHLORIDE 0.9 % IV SOLN
5.0000 mg/kg | Freq: Once | INTRAVENOUS | Status: AC
  Administered 2021-04-26: 400 mg via INTRAVENOUS
  Filled 2021-04-26: qty 16

## 2021-04-26 MED ORDER — SODIUM CHLORIDE 0.9 % IV SOLN
150.0000 mg/m2 | Freq: Once | INTRAVENOUS | Status: AC
  Administered 2021-04-26: 280 mg via INTRAVENOUS
  Filled 2021-04-26: qty 10

## 2021-04-26 MED ORDER — DIPHENHYDRAMINE HCL 50 MG/ML IJ SOLN
25.0000 mg | Freq: Once | INTRAMUSCULAR | Status: AC
  Administered 2021-04-26: 25 mg via INTRAVENOUS

## 2021-04-26 MED ORDER — SODIUM CHLORIDE 0.9 % IV SOLN
750.0000 mg | Freq: Once | INTRAVENOUS | Status: AC
  Administered 2021-04-26: 750 mg via INTRAVENOUS
  Filled 2021-04-26: qty 37.5

## 2021-04-26 MED ORDER — FLUOROURACIL CHEMO INJECTION 2.5 GM/50ML
400.0000 mg/m2 | Freq: Once | INTRAVENOUS | Status: AC
  Administered 2021-04-26: 750 mg via INTRAVENOUS
  Filled 2021-04-26: qty 15

## 2021-04-26 MED ORDER — SODIUM CHLORIDE 0.9 % IV SOLN
Freq: Once | INTRAVENOUS | Status: AC
  Filled 2021-04-26: qty 250

## 2021-04-26 MED ORDER — MONTELUKAST SODIUM 10 MG PO TABS
10.0000 mg | ORAL_TABLET | Freq: Once | ORAL | Status: AC
Start: 2021-04-26 — End: 2021-04-26
  Administered 2021-04-26: 10 mg via ORAL
  Filled 2021-04-26: qty 1

## 2021-04-26 MED ORDER — DIPHENHYDRAMINE HCL 50 MG/ML IJ SOLN
25.0000 mg | Freq: Once | INTRAMUSCULAR | Status: AC
  Administered 2021-04-26: 25 mg via INTRAVENOUS
  Filled 2021-04-26: qty 1

## 2021-04-26 MED ORDER — PALONOSETRON HCL INJECTION 0.25 MG/5ML
0.2500 mg | Freq: Once | INTRAVENOUS | Status: AC
  Administered 2021-04-26: 0.25 mg via INTRAVENOUS
  Filled 2021-04-26: qty 5

## 2021-04-26 MED ORDER — HEPARIN SOD (PORK) LOCK FLUSH 100 UNIT/ML IV SOLN
500.0000 [IU] | Freq: Once | INTRAVENOUS | Status: DC
  Filled 2021-04-26: qty 5

## 2021-04-26 NOTE — Progress Notes (Signed)
Pt here for follow up. Pt reports having some diarrhea today because she ate "something that [she] wasn't suppose to."

## 2021-04-26 NOTE — Patient Instructions (Signed)
[facility]  Discharge Instructions: Thank you for choosing [facility] to provide your oncology and hematology care.  If you have a lab appointment with the [facility], please go directly to the [facility] and check in at the registration area.  Wear comfortable clothing and clothing appropriate for easy access to any Portacath or PICC line.   We strive to give you quality time with your provider. You may need to reschedule your appointment if you arrive late (15 or more minutes).  Arriving late affects you and other patients whose appointments are after yours.  Also, if you miss three or more appointments without notifying the office, you may be dismissed from the clinic at the provider's discretion.      For prescription refill requests, have your pharmacy contact our office and allow 72 hours for refills to be completed.    Today you received the following chemotherapy and/or immunotherapy agents: MVASI, FOLFIRI      To help prevent nausea and vomiting after your treatment, we encourage you to take your nausea medication as directed.  BELOW ARE SYMPTOMS THAT SHOULD BE REPORTED IMMEDIATELY: *FEVER GREATER THAN 100.4 F (38 C) OR HIGHER *CHILLS OR SWEATING *NAUSEA AND VOMITING THAT IS NOT CONTROLLED WITH YOUR NAUSEA MEDICATION *UNUSUAL SHORTNESS OF BREATH *UNUSUAL BRUISING OR BLEEDING *URINARY PROBLEMS (pain or burning when urinating, or frequent urination) *BOWEL PROBLEMS (unusual diarrhea, constipation, pain near the anus) TENDERNESS IN MOUTH AND THROAT WITH OR WITHOUT PRESENCE OF ULCERS (sore throat, sores in mouth, or a toothache) UNUSUAL RASH, SWELLING OR PAIN  UNUSUAL VAGINAL DISCHARGE OR ITCHING   Items with * indicate a potential emergency and should be followed up as soon as possible or go to the Emergency Department if any problems should occur.  Please show the CHEMOTHERAPY ALERT CARD or IMMUNOTHERAPY ALERT CARD at  check-in to the Emergency Department and triage nurse.  Should you have questions after your visit or need to cancel or reschedule your appointment, please contact [facility]  [phone] and follow the prompts.  Office hours are 8:00 a.m. to 4:30 p.m. Monday - Friday. Please note that voicemails left after 4:00 p.m. may not be returned until the following business day.  We are closed weekends and major holidays. You have access to a nurse at all times for urgent questions. Please call the main number to the clinic [phone] and follow the prompts.  For any non-urgent questions, you may also contact your provider using MyChart. We now offer e-Visits for anyone 7 and older to request care online for non-urgent symptoms. For details visit [email or web address].   Also download the MyChart app! Go to the app store, search "MyChart", open the app, select [facility], and log in with your MyChart username and password.  Due to Covid, a mask is required upon entering the hospital/clinic. If you do not have a mask, one will be given to you upon arrival. For doctor visits, patients may have 1 support person aged 3 or older with them. For treatment visits, patients cannot have anyone with them due to current Covid guidelines and our immunocompromised population. Fluorouracil, 5-FU injection What is this medication? FLUOROURACIL, 5-FU (flure oh YOOR a sil) is a chemotherapy drug. It slows the growth of cancer cells. This medicine is used to treat many types of cancer like breast cancer, colon or rectal cancer, pancreatic cancer, and stomachcancer. This medicine may be used for other purposes; ask your  health care provider orpharmacist if you have questions. COMMON BRAND NAME(S): Adrucil What should I tell my care team before I take this medication? They need to know if you have any of these conditions: blood disorders dihydropyrimidine dehydrogenase (DPD)  deficiency infection (especially a virus infection such as chickenpox, cold sores, or herpes) kidney disease liver disease malnourished, poor nutrition recent or ongoing radiation therapy an unusual or allergic reaction to fluorouracil, other chemotherapy, other medicines, foods, dyes, or preservatives pregnant or trying to get pregnant breast-feeding How should I use this medication? This drug is given as an infusion or injection into a vein. It is administeredin a hospital or clinic by a specially trained health care professional. Talk to your pediatrician regarding the use of this medicine in children.Special care may be needed. Overdosage: If you think you have taken too much of this medicine contact apoison control center or emergency room at once. NOTE: This medicine is only for you. Do not share this medicine with others. What if I miss a dose? It is important not to miss your dose. Call your doctor or health careprofessional if you are unable to keep an appointment. What may interact with this medication? Do not take this medicine with any of the following medications: live virus vaccines This medicine may also interact with the following medications: medicines that treat or prevent blood clots like warfarin, enoxaparin, and dalteparin This list may not describe all possible interactions. Give your health care provider a list of all the medicines, herbs, non-prescription drugs, or dietary supplements you use. Also tell them if you smoke, drink alcohol, or use illegaldrugs. Some items may interact with your medicine. What should I watch for while using this medication? Visit your doctor for checks on your progress. This drug may make you feel generally unwell. This is not uncommon, as chemotherapy can affect healthy cells as well as cancer cells. Report any side effects. Continue your course oftreatment even though you feel ill unless your doctor tells you to stop. In some cases, you  may be given additional medicines to help with side effects.Follow all directions for their use. Call your doctor or health care professional for advice if you get a fever, chills or sore throat, or other symptoms of a cold or flu. Do not treat yourself. This drug decreases your body's ability to fight infections. Try toavoid being around people who are sick. This medicine may increase your risk to bruise or bleed. Call your doctor orhealth care professional if you notice any unusual bleeding. Be careful brushing and flossing your teeth or using a toothpick because you may get an infection or bleed more easily. If you have any dental work done,tell your dentist you are receiving this medicine. Avoid taking products that contain aspirin, acetaminophen, ibuprofen, naproxen, or ketoprofen unless instructed by your doctor. These medicines may hide afever. Do not become pregnant while taking this medicine. Women should inform their doctor if they wish to become pregnant or think they might be pregnant. There is a potential for serious side effects to an unborn child. Talk to your health care professional or pharmacist for more information. Do not breast-feed aninfant while taking this medicine. Men should inform their doctor if they wish to father a child. This medicinemay lower sperm counts. Do not treat diarrhea with over the counter products. Contact your doctor ifyou have diarrhea that lasts more than 2 days or if it is severe and watery. This medicine can make you more sensitive to the  sun. Keep out of the sun. If you cannot avoid being in the sun, wear protective clothing and use sunscreen.Do not use sun lamps or tanning beds/booths. What side effects may I notice from receiving this medication? Side effects that you should report to your doctor or health care professionalas soon as possible: allergic reactions like skin rash, itching or hives, swelling of the face, lips, or tongue low blood counts -  this medicine may decrease the number of white blood cells, red blood cells and platelets. You may be at increased risk for infections and bleeding. signs of infection - fever or chills, cough, sore throat, pain or difficulty passing urine signs of decreased platelets or bleeding - bruising, pinpoint red spots on the skin, black, tarry stools, blood in the urine signs of decreased red blood cells - unusually weak or tired, fainting spells, lightheadedness breathing problems changes in vision chest pain mouth sores nausea and vomiting pain, swelling, redness at site where injected pain, tingling, numbness in the hands or feet redness, swelling, or sores on hands or feet stomach pain unusual bleeding Side effects that usually do not require medical attention (report to yourdoctor or health care professional if they continue or are bothersome): changes in finger or toe nails diarrhea dry or itchy skin hair loss headache loss of appetite sensitivity of eyes to the light stomach upset unusually teary eyes This list may not describe all possible side effects. Call your doctor for medical advice about side effects. You may report side effects to FDA at1-800-FDA-1088. Where should I keep my medication? This drug is given in a hospital or clinic and will not be stored at home. NOTE: This sheet is a summary. It may not cover all possible information. If you have questions about this medicine, talk to your doctor, pharmacist, orhealth care provider.  2022 Elsevier/Gold Standard (2019-09-02 15:00:03) Leucovorin injection What is this medication? LEUCOVORIN (loo koe VOR in) is used to prevent or treat the harmful effects of some medicines. This medicine is used to treat anemia caused by a low amount of folic acid in the body. It is also used with 5-fluorouracil (5-FU) to treatcolon cancer. This medicine may be used for other purposes; ask your health care provider orpharmacist if you have  questions. What should I tell my care team before I take this medication? They need to know if you have any of these conditions: anemia from low levels of vitamin B-12 in the blood an unusual or allergic reaction to leucovorin, folic acid, other medicines, foods, dyes, or preservatives pregnant or trying to get pregnant breast-feeding How should I use this medication? This medicine is for injection into a muscle or into a vein. It is given by ahealth care professional in a hospital or clinic setting. Talk to your pediatrician regarding the use of this medicine in children.Special care may be needed. Overdosage: If you think you have taken too much of this medicine contact apoison control center or emergency room at once. NOTE: This medicine is only for you. Do not share this medicine with others. What if I miss a dose? This does not apply. What may interact with this medication? capecitabine fluorouracil phenobarbital phenytoin primidone trimethoprim-sulfamethoxazole This list may not describe all possible interactions. Give your health care provider a list of all the medicines, herbs, non-prescription drugs, or dietary supplements you use. Also tell them if you smoke, drink alcohol, or use illegaldrugs. Some items may interact with your medicine. What should I watch for while using  this medication? Your condition will be monitored carefully while you are receiving thismedicine. This medicine may increase the side effects of 5-fluorouracil, 5-FU. Tell your doctor or health care professional if you have diarrhea or mouth sores that donot get better or that get worse. What side effects may I notice from receiving this medication? Side effects that you should report to your doctor or health care professionalas soon as possible: allergic reactions like skin rash, itching or hives, swelling of the face, lips, or tongue breathing problems fever, infection mouth sores unusual bleeding or  bruising unusually weak or tired Side effects that usually do not require medical attention (report to yourdoctor or health care professional if they continue or are bothersome): constipation or diarrhea loss of appetite nausea, vomiting This list may not describe all possible side effects. Call your doctor for medical advice about side effects. You may report side effects to FDA at1-800-FDA-1088. Where should I keep my medication? This drug is given in a hospital or clinic and will not be stored at home. NOTE: This sheet is a summary. It may not cover all possible information. If you have questions about this medicine, talk to your doctor, pharmacist, orhealth care provider.  2022 Elsevier/Gold Standard (2008-04-07 16:50:29) Bevacizumab injection What is this medication? BEVACIZUMAB (be va SIZ yoo mab) is a monoclonal antibody. It is used to treatmany types of cancer. This medicine may be used for other purposes; ask your health care provider orpharmacist if you have questions. COMMON BRAND NAME(S): Avastin, MVASI, Noah Charon What should I tell my care team before I take this medication? They need to know if you have any of these conditions: diabetes heart disease high blood pressure history of coughing up blood prior anthracycline chemotherapy (e.g., doxorubicin, daunorubicin, epirubicin) recent or ongoing radiation therapy recent or planning to have surgery stroke an unusual or allergic reaction to bevacizumab, hamster proteins, mouse proteins, other medicines, foods, dyes, or preservatives pregnant or trying to get pregnant breast-feeding How should I use this medication? This medicine is for infusion into a vein. It is given by a health careprofessional in a hospital or clinic setting. Talk to your pediatrician regarding the use of this medicine in children.Special care may be needed. Overdosage: If you think you have taken too much of this medicine contact apoison control center  or emergency room at once. NOTE: This medicine is only for you. Do not share this medicine with others. What if I miss a dose? It is important not to miss your dose. Call your doctor or health careprofessional if you are unable to keep an appointment. What may interact with this medication? Interactions are not expected. This list may not describe all possible interactions. Give your health care provider a list of all the medicines, herbs, non-prescription drugs, or dietary supplements you use. Also tell them if you smoke, drink alcohol, or use illegaldrugs. Some items may interact with your medicine. What should I watch for while using this medication? Your condition will be monitored carefully while you are receiving this medicine. You will need important blood work and urine testing done while youare taking this medicine. This medicine may increase your risk to bruise or bleed. Call your doctor orhealth care professional if you notice any unusual bleeding. Before having surgery, talk to your health care provider to make sure it is ok. This drug can increase the risk of poor healing of your surgical site or wound. You will need to stop this drug for 28 days before surgery.  After surgery, wait at least 28 days before restarting this drug. Make sure the surgical site or wound is healed enough before restarting this drug. Talk to your health careprovider if questions. Do not become pregnant while taking this medicine or for 6 months after stopping it. Women should inform their doctor if they wish to become pregnant or think they might be pregnant. There is a potential for serious side effects to an unborn child. Talk to your health care professional or pharmacist for more information. Do not breast-feed an infant while taking this medicine andfor 6 months after the last dose. This medicine has caused ovarian failure in some women. This medicine may interfere with the ability to have a child. You should  talk to your doctor orhealth care professional if you are concerned about your fertility. What side effects may I notice from receiving this medication? Side effects that you should report to your doctor or health care professionalas soon as possible: allergic reactions like skin rash, itching or hives, swelling of the face, lips, or tongue chest pain or chest tightness chills coughing up blood high fever seizures severe constipation signs and symptoms of bleeding such as bloody or black, tarry stools; red or dark-brown urine; spitting up blood or brown material that looks like coffee grounds; red spots on the skin; unusual bruising or bleeding from the eye, gums, or nose signs and symptoms of a blood clot such as breathing problems; chest pain; severe, sudden headache; pain, swelling, warmth in the leg signs and symptoms of a stroke like changes in vision; confusion; trouble speaking or understanding; severe headaches; sudden numbness or weakness of the face, arm or leg; trouble walking; dizziness; loss of balance or coordination stomach pain sweating swelling of legs or ankles vomiting weight gain Side effects that usually do not require medical attention (report to yourdoctor or health care professional if they continue or are bothersome): back pain changes in taste decreased appetite dry skin nausea tiredness This list may not describe all possible side effects. Call your doctor for medical advice about side effects. You may report side effects to FDA at1-800-FDA-1088. Where should I keep my medication? This drug is given in a hospital or clinic and will not be stored at home. NOTE: This sheet is a summary. It may not cover all possible information. If you have questions about this medicine, talk to your doctor, pharmacist, orhealth care provider.  2022 Elsevier/Gold Standard (2019-07-30 10:50:46) Irinotecan injection What is this medication? IRINOTECAN (ir in oh TEE kan ) is a  chemotherapy drug. It is used to treatcolon and rectal cancer. This medicine may be used for other purposes; ask your health care provider orpharmacist if you have questions. COMMON BRAND NAME(S): Camptosar What should I tell my care team before I take this medication? They need to know if you have any of these conditions: dehydration diarrhea infection (especially a virus infection such as chickenpox, cold sores, or herpes) liver disease low blood counts, like low white cell, platelet, or red cell counts low levels of calcium, magnesium, or potassium in the blood recent or ongoing radiation therapy an unusual or allergic reaction to irinotecan, other medicines, foods, dyes, or preservatives pregnant or trying to get pregnant breast-feeding How should I use this medication? This drug is given as an infusion into a vein. It is administered in a hospitalor clinic by a specially trained health care professional. Talk to your pediatrician regarding the use of this medicine in children.Special care may be needed.  Overdosage: If you think you have taken too much of this medicine contact apoison control center or emergency room at once. NOTE: This medicine is only for you. Do not share this medicine with others. What if I miss a dose? It is important not to miss your dose. Call your doctor or health careprofessional if you are unable to keep an appointment. What may interact with this medication? Do not take this medicine with any of the following medications: cobicistat itraconazole This medicine may interact with the following medications: antiviral medicines for HIV or AIDS certain antibiotics like rifampin or rifabutin certain medicines for fungal infections like ketoconazole, posaconazole, and voriconazole certain medicines for seizures like carbamazepine, phenobarbital, phenotoin clarithromycin gemfibrozil nefazodone [facility]. John's Wort This list may not describe all possible  interactions. Give your health care provider a list of all the medicines, herbs, non-prescription drugs, or dietary supplements you use. Also tell them if you smoke, drink alcohol, or use illegaldrugs. Some items may interact with your medicine. What should I watch for while using this medication? Your condition will be monitored carefully while you are receiving this medicine. You will need important blood work done while you are taking thismedicine. This drug may make you feel generally unwell. This is not uncommon, as chemotherapy can affect healthy cells as well as cancer cells. Report any side effects. Continue your course of treatment even though you feel ill unless yourdoctor tells you to stop. In some cases, you may be given additional medicines to help with side effects.Follow all directions for their use. You may get drowsy or dizzy. Do not drive, use machinery, or do anything that needs mental alertness until you know how this medicine affects you. Do not stand or sit up quickly, especially if you are an older patient. This reducesthe risk of dizzy or fainting spells. Call your health care professional for advice if you get a fever, chills, or sore throat, or other symptoms of a cold or flu. Do not treat yourself. This medicine decreases your body's ability to fight infections. Try to avoid beingaround people who are sick. Avoid taking products that contain aspirin, acetaminophen, ibuprofen, naproxen, or ketoprofen unless instructed by your doctor. These medicines may hide afever. This medicine may increase your risk to bruise or bleed. Call your doctor orhealth care professional if you notice any unusual bleeding. Be careful brushing and flossing your teeth or using a toothpick because you may get an infection or bleed more easily. If you have any dental work done,tell your dentist you are receiving this medicine. Do not become pregnant while taking this medicine or for 6 months after stopping  it. Women should inform their health care professional if they wish to become pregnant or think they might be pregnant. Men should not father a child while taking this medicine and for 3 months after stopping it. There is potential for serious side effects to an unborn child. Talk to your health careprofessional for more information. Do not breast-feed an infant while taking this medicine or for 7 days afterstopping it. This medicine has caused ovarian failure in some women. This medicine may make it more difficult to get pregnant. Talk to your health care professional if [place] S[place] concerned about your fertility. This medicine has caused decreased sperm counts in some men. This may make it more difficult to father a child. Talk to your health care professional if [place] concerned about your fertility. What side effects may I notice from receiving this medication? Side effects that  you should report to your doctor or health care professionalas soon as possible: allergic reactions like skin rash, itching or hives, swelling of the face, lips, or tongue chest pain diarrhea flushing, runny nose, sweating during infusion low blood counts - this medicine may decrease the number of white blood cells, red blood cells and platelets. You may be at increased risk for infections and bleeding. nausea, vomiting pain, swelling, warmth in the leg signs of decreased platelets or bleeding - bruising, pinpoint red spots on the skin, black, tarry stools, blood in the urine signs of infection - fever or chills, cough, sore throat, pain or difficulty passing urine signs of decreased red blood cells - unusually weak or tired, fainting spells, lightheadedness Side effects that usually do not require medical attention (report to yourdoctor or health care professional if they continue or are bothersome): constipation hair loss headache loss of appetite mouth sores stomach pain This list may not describe all possible side  effects. Call your doctor for medical advice about side effects. You may report side effects to FDA at1-800-FDA-1088. Where should I keep my medication? This drug is given in a hospital or clinic and will not be stored at home. NOTE: This sheet is a summary. It may not cover all possible information. If you have questions about this medicine, talk to your doctor, pharmacist, orhealth care provider.  2022 Elsevier/Gold Standard (2019-09-02 17:46:13)

## 2021-04-26 NOTE — Progress Notes (Signed)
Hematology/Oncology progress note [facility] Telephone:([phone] [phone]   Patient Care Team: Olin Hauser, DO as PCP - General (Family Medicine) Clent Jacks, RN as Registered Nurse Earlie Server, MD as Consulting Physician (Hematology and Oncology)  CHIEF COMPLAINTS/REASON FOR VISIT:  Follow up for rectal cancer  HISTORY OF PRESENTING ILLNESS:  Jacqueline Yoder is a  75 y.o.  female with PMH listed below who was referred to me for evaluation of endometrial cancer.   Patient initially presented with complaints of postmenopausal bleeding on 08/16/2018.  History of was menopausal vaginal bleeding in 2016 which resulted in cervical polypectomy.  Pathology 02/04/2015 showed cervical polyp, consistent with benign endometrial polyp.  [name] after polypectomy due to anxiety associated with pelvic exams.  pelvic exam on 08/16/2018 reviewed cervical abnormality and from enlarged uterus. Seen by Dr. Marcelline Mates on 10/29/2018.  Endometrial biopsy and a Pap smear was performed. 10/29/2018 Pap smear showed adenocarcinoma, favor endometrial origin. 10/29/2018 endometrial biopsy showed endometrioid carcinoma, FIGO grade 1.  10/29/2018- TA & TV Ultrasound revealed: Anteverted uterus measuring 8.7 x 5.6 x 6.4 cm without evidence of focal masses.  The endometrium measuring 24.1 mm (thickened) and heterogeneous.  Right and left ovaries not visualized.  No adnexal masses identified.  No free fluid in cul-de-sac.  Patient was seen by Dr. Theora Gianotti in clinic on 11/13/2018.  Cervical exam reveals 2 cm exophytic irregular mass consistent with malignancy.   11/19/2018 CT chest abdomen pelvis with contrast showed thickened endometrium with some irregularity compatible with the provided diagnosis of endometrial malignancy.  There is a mildly prominent left inguinal node 1.4 cm.  Patient was seen by Dr. Fransisca Connors on 11/20/2018 and left groin lymph node biopsy was  recommended.  11/26/2018 patient underwent left inguinal lymph node biopsy. Pathology showed metastatic adenocarcinoma consistent with colorectal origin.  CDX 2+.  Case was discussed on tumor board.  Recommend colonoscopy for further evaluation.  Patient reports significant weight loss 30 pounds over the last year.  Chronic vaginal spotting. Change of bowel habits the past few months.  More constipated.  Family history positive for brother who has colon cancer prostate cancer.  patient has underwent colonoscopy on 12/03/2018 which reviewed a nonobstructing large mass in the rectum.  Also chronic fistula.  Mass was not circumferential.  This was biopsied with a cold forceps for histology.  Pathology came back hyperplastic polyp negative for dysplasia and malignancy. [name], patient underwent flex sigmoidoscopy on 12/06/2018 with rebiopsy of the rectal mass. This time biopsy results came back positive for invasive colorectal adenocarcinoma, moderately differentiated. Immunotherapy for nearly mismatch repair protein (MMR ) was performed.  There is no loss of MMR expression.  low probability of MSI high.   # Seen by [name] for evaluation of resectability for rectal cancer. In addition, she also had a second opinion with [facility] where her endometrial biopsy pathology was changed to  adenocarcinoma, consistent with colorectal primary.   [name]. She has home health that has been assisting with ostomy care  Patient was also evaluated by [facility] oncology.  Recommendation is to proceed with TNT with concurrent chemoradiation followed by neoadjuvant chemotherapy followed by surgical resection. Patient prefers to have treatment done locally with [facility].   # Oncology Treatment:  02/03/2019- 03/19/2019  concurrent Xeloda and radiation.  Xeloda dose 880m /m2 BID - rounded to 16562mBID- on days of radiation. 04/09/2019, started on FOLFOX  with  bolus 5-FU omitted.  07/16/2019 finished 8 cycles of FOLFOX. 09/17/19 APR/posterior vaginectomy/TAH/BSO/VY-flap pT4b pN0 with close vaginal margin 0.2 mm.  Uterus and ovaries negative for malignancy. Patient reports bilateral lower extremity numbness and tingling, intermittent, left worse than right. She has lost a lot of weight since her APR surgery.   #Family history with half brother having's history of colon cancer prostate cancer.  Personal history of colorectal cancer.  Patient has not decided if she wants genetic testing.    # history of PE( 01/13/2020)  in the bilateral lower extremity DVT (01/13/2020).   She finishes 6 months of anticoagulation with Eliquis 5 mg twice daily. Now switched to Eliquis 2.5 mg twice daily..  # She has now developed recurrent disease. #06/30/20  vaginal introitus mass biopsied. Pathology is consistent with metastatic colorectal adenocarcinoma I have discussed with [name]  Dr. Hester Mates and the mass is not resectable. Patient has also had colonoscopy by Dr. Vicente Males yesterday. Normal examination. # 07/16/2020 cycle 1 FOLFIRI  # 07/20/2020 PET scan was done for further evaluation, images are consistent with local recurrence, no distant metastasis. #Discussed with radiation oncology Dr. Baruch Gouty will recommends concurrent chemotherapy and radiation. 08/02/2020-08/16/2020, patient starts radiation.  Xeloda was held due to neutropenia 08/17/2020,-09/06/2020 Xeloda 1500 mg twice daily concurrently with radiation  01/31/21 start FOLFIRI + Bev   INTERVAL HISTORY Jacqueline Yoder is a 75 y.o. female who has above history reviewed by me presents for follow-up of rectal cancer. She tolerates chemotheray well.   She has gained 1 pound since last visit.  Denies any abdominal pain, nausea vomiting  She has no diarrhea during the interval, except this morning, she had a loose bowel movement.    Review of Systems  Constitutional:  Negative for appetite change, chills,  fatigue, fever and unexpected weight change.  HENT:   Negative for hearing loss and voice change.   Eyes:  Negative for eye problems.  Respiratory:  Negative for chest tightness and cough.   Cardiovascular:  Negative for chest pain.  Gastrointestinal:  Positive for diarrhea. Negative for abdominal distention, abdominal pain, blood in stool, constipation and nausea.  Endocrine: Negative for hot flashes.  Genitourinary:  Negative for difficulty urinating and frequency.   Musculoskeletal:  Negative for arthralgias.  Skin:  Negative for itching and rash.  Neurological:  Negative for extremity weakness and numbness.  Hematological:  Negative for adenopathy.  Psychiatric/Behavioral:  Negative for confusion.    MEDICAL HISTORY:  Past Medical History:  Diagnosis Date   Allergy    Arthritis    Blood clot in vein    Family history of colon cancer    GERD (gastroesophageal reflux disease)    Hypercholesteremia    Hypertension    Hypertension    Lower extremity edema    Rectal cancer ([place]) 12/2018   Urinary incontinence     SURGICAL HISTORY: Past Surgical History:  Procedure Laterality Date   ABDOMINAL HYSTERECTOMY     CHOLECYSTECTOMY  1971   COLONOSCOPY WITH PROPOFOL N/A 12/03/2018   Procedure: COLONOSCOPY WITH PROPOFOL;  Surgeon: Lucilla Lame, MD;  Location: [facility] ENDOSCOPY;  Service: Endoscopy;  Laterality: N/A;   COLONOSCOPY WITH PROPOFOL N/A 07/15/2020   Procedure: COLONOSCOPY WITH PROPOFOL;  Surgeon: [name], MD;  Location: [facility] ENDOSCOPY;  Service: Gastroenterology;  Laterality: N/A;   FLEXIBLE SIGMOIDOSCOPY N/A 12/06/2018   Procedure: FLEXIBLE SIGMOIDOSCOPY;  Surgeon: [name], MD;  Location: [facility] ENDOSCOPY;  Service: Endoscopy;  Laterality: N/A;   LAPAROSCOPIC COLOSTOMY  01/06/2019  PORTACATH PLACEMENT N/A 04/03/2019   Procedure: INSERTION PORT-A-CATH;  Surgeon: Jules Husbands, MD;  Location: [facility] ORS;  Service: General;  Laterality: N/A;    SOCIAL HISTORY: Social  History   Socioeconomic History   Marital status: Married    Spouse name: Not on file   Number of children: Not on file   Years of education: Not on file   Highest education level: Not on file  Occupational History   Not on file  Tobacco Use   Smoking status: Former    Pack years: 0.00    Types: Cigarettes    Quit date: 12/02/1977    Years since quitting: 43.4   Smokeless tobacco: Former  Scientific laboratory technician Use: Never used  Substance and Sexual Activity   Alcohol use: Never   Drug use: Never   Sexual activity: Not Currently    Birth control/protection: None  Other Topics Concern   Not on file  Social History Narrative   Lives with daughter   Social Determinants of Health   Financial Resource Strain: Not on file  Food Insecurity: Not on file  Transportation Needs: Not on file  Physical Activity: Not on file  Stress: Not on file  Social Connections: Not on file  Intimate Partner Violence: Not on file    FAMILY HISTORY: Family History  Problem Relation Age of Onset   Colon cancer Brother 75       exposure to chemicals [place]   Hypertension Mother    Stroke Mother    Kidney failure Father    Breast cancer Neg Hx    Ovarian cancer Neg Hx     ALLERGIES:  is allergic to sulfamethoxazole-trimethoprim.  MEDICATIONS:  Current Outpatient Medications  Medication Sig Dispense Refill   apixaban (ELIQUIS) 2.5 MG TABS tablet Take 1 tablet (2.5 mg total) by mouth 2 (two) times daily. 180 tablet 1   Cholecalciferol (VITAMIN D3) 2000 units capsule Take 2,000 Units by mouth daily.     diclofenac sodium (VOLTAREN) 1 % GEL Apply 2 g topically 4 (four) times daily as needed (joint pain).  11   docusate sodium (COLACE) 100 MG capsule Take 100 mg by mouth 2 (two) times daily as needed.     fexofenadine (ALLEGRA) 180 MG tablet Take 180 mg by mouth daily. (Patient not taking: Reported on 03/29/2021)     fluticasone (FLONASE) 50 MCG/ACT nasal spray Place 1 spray into both nostrils  daily. 16 g 1   gabapentin (NEURONTIN) 100 MG capsule Take 1 capsule (100 mg total) by mouth at bedtime. 30 capsule 0   ipratropium (ATROVENT) 0.03 % nasal spray Place 1 spray into both nostrils 2 (two) times daily. 30 mL 2   lidocaine-prilocaine (EMLA) cream Apply 1 application topically as needed. 30 g 6   loperamide (IMODIUM) 2 MG capsule Take 1 capsule (2 mg total) by mouth See admin instructions. With onset of loose stool, take 49m followed by 270mevery 2 hours,  Maximum: 16 mg/day 120 capsule 1   loratadine (CLARITIN) 10 MG tablet Take 10 mg by mouth daily.     Multiple Vitamins-Minerals (ONE-A-DAY WOMENS 50 PLUS PO) Take 1 tablet by mouth daily.      ondansetron (ZOFRAN) 8 MG tablet Take 1 tablet (8 mg total) by mouth 2 (two) times daily as needed for refractory nausea / vomiting. Start on day 3 after chemotherapy. 30 tablet 1   potassium chloride SA (KLOR-CON) 20 MEQ tablet Take 1 tablet (20 mEq total)  by mouth daily. 30 tablet 0   prochlorperazine (COMPAZINE) 10 MG tablet Take 1 tablet (10 mg total) by mouth every 6 (six) hours as needed (NAUSEA). 30 tablet 1   simvastatin (ZOCOR) 40 MG tablet Take 40 mg by mouth at bedtime.      triamterene-hydrochlorothiazide (DYAZIDE) 37.5-25 MG capsule Take 1 capsule by mouth daily.      zinc gluconate 50 MG tablet Take 50 mg by mouth daily.     No current facility-administered medications for this visit.   Facility-Administered Medications Ordered in Other Visits  Medication Dose Route Frequency Provider Last Rate Last Admin   heparin lock flush 100 unit/mL  500 Units Intravenous Once Earlie Server, MD         PHYSICAL EXAMINATION: ECOG PERFORMANCE STATUS: 1 - Symptomatic but completely ambulatory  Physical Exam Constitutional:      General: She is not in acute distress. HENT:     Head: Normocephalic and atraumatic.  Eyes:     General: No scleral icterus. Cardiovascular:     Rate and Rhythm: Normal rate and regular rhythm.     Heart sounds:  Normal heart sounds.  Pulmonary:     Effort: Pulmonary effort is normal. No respiratory distress.     Breath sounds: No wheezing.  Abdominal:     General: Bowel sounds are normal. There is no distension.     Palpations: Abdomen is soft.     Comments: + Colostomy bag   Musculoskeletal:        General: No deformity. Normal range of motion.     Cervical back: Normal range of motion and neck supple.  Skin:    General: Skin is warm and dry.     Findings: No erythema or rash.  Neurological:     Mental Status: She is alert and oriented to person, place, and time. Mental status is at baseline.     Cranial Nerves: No cranial nerve deficit.     Coordination: Coordination normal.  Psychiatric:     Comments: Grieving   CMP Latest Ref Rng & Units 04/12/2021  Glucose 70 - 99 mg/dL 102(H)  BUN 8 - 23 mg/dL 8  Creatinine 0.44 - 1.00 mg/dL 0.77  Sodium 135 - 145 mmol/L 136  Potassium 3.5 - 5.1 mmol/L 3.5  Chloride 98 - 111 mmol/L 100  CO2 22 - 32 mmol/L 28  Calcium 8.9 - 10.3 mg/dL 9.3  Total Protein 6.5 - 8.1 g/dL 6.8  Total Bilirubin 0.3 - 1.2 mg/dL 0.8  Alkaline Phos 38 - 126 U/L 73  AST 15 - 41 U/L 31  ALT 0 - 44 U/L 19   CBC Latest Ref Rng & Units 04/12/2021  WBC 4.0 - 10.5 K/uL 5.9  Hemoglobin 12.0 - 15.0 g/dL 11.9(L)  Hematocrit 36.0 - 46.0 % 36.8  Platelets 150 - 400 K/uL 198    LABORATORY DATA:  I have reviewed the data as listed Lab Results  Component Value Date   WBC 5.9 04/12/2021   HGB 11.9 (L) 04/12/2021   HCT 36.8 04/12/2021   MCV 95.6 04/12/2021   PLT 198 04/12/2021   Recent Labs    06/23/20 1124 07/16/20 0802 07/23/20 1345 03/15/21 0804 03/29/21 0836 04/12/21 0803  NA 140 140   < > 138 137 136  K 3.7 3.2*   < > 3.8 3.5 3.5  CL 100 101   < > 101 99 100  CO2 30 29   < > '27 28 28  ' GLUCOSE 114*  114*   < > 107* 102* 102*  BUN 10 9   < > '11 8 8  ' CREATININE 0.97 0.87   < > 0.80 0.98 0.77  CALCIUM 9.1 9.2   < > 9.5 9.4 9.3  GFRNONAA 58* >60   < > >60 >60 >60   GFRAA >60 >60  --   --   --   --   PROT 7.0 6.9   < > 7.1 7.3 6.8  ALBUMIN 4.0 3.6   < > 4.1 4.3 4.0  AST 25 24   < > 41 27 31  ALT 15 14   < > '27 15 19  ' ALKPHOS 41 41   < > 65 71 73  BILITOT 0.4 0.5   < > 0.4 0.6 0.8   < > = values in this interval not displayed.    Iron/TIBC/Ferritin/ %Sat    Component Value Date/Time   IRON 40 06/23/2020 1124   TIBC 246 (L) 06/23/2020 1124   FERRITIN 338 (H) 06/23/2020 1124   IRONPCTSAT 16 06/23/2020 1124     RADIOGRAPHIC STUDIES: I have personally reviewed the radiological images as listed and agreed with the findings in the report. No results found.   No results found.   ASSESSMENT & PLAN:  1. Encounter for antineoplastic chemotherapy   2. Metastatic adenocarcinoma ([place])   3. Chemotherapy induced neutropenia ([facility])   4. History of DVT (deep vein thrombosis)   5. History of pulmonary embolism   6. Chemotherapy induced diarrhea   Cancer Staging Rectal cancer [facility]) Staging form: Colon and Rectum, AJCC 8th Edition - Clinical stage from 01/23/2019: Stage IIIC (cT4b, cN1a, cM0) - Signed by Earlie Server, MD on 01/23/2019 - Pathologic stage from 10/06/2019: Stage IIC (ypT4b, pN0, cM0) - Signed by Earlie Server, MD on 10/06/2019 - Pathologic: Stage Unknown (rpTX, pNX, cM1) - Signed by Earlie Server, MD on 01/31/2021  #History of stage IIIC Rectal cancer, s/p TNT, followed by 09/17/19 APR/posterior vaginectomy/TAH/BSO/VY-flap, pT4b pN0 with close vaginal margin 0.2 mm.  Uterus and ovaries negative for malignancy. palliative radiation to vaginal recurrence.  Finished 09/04/2020 01/19/21 recurrence with lung metastasis.-Palliative chemotherapy Labs are reviewed and discussed with patient. Proceed with  FOLFIRI/bevacizumab chemotherapy- Irinotecan 150 mg/m. -Neulasta on day 3.  take Claritin for 4 days. CEA is trending down, today's level is pending.  Discussed about repeating CT scan for evaluation of treatment response.   #Chemotherapy-induced neutropenia, see  above.  On GCSF  #Chemotherapy-induced diarrhea, symptoms are stable.  As needed antidiarrhea medications.Marland Kitchen  #Hypokalemia, potassium 3.5, continue potassium chloride 20 mEq daily.    #History of pulmonary embolism, history of bilateral DVT continue Eliquis 2.5 mg twice daily.  #Chemotherapy-induced hives, recommend patient to take Benadryl and Pepcid at home. Add Singulair to premedication.  Follow-up 2 weeks for next cycle of treatment.  Earlie Server, MD, PhD

## 2021-04-26 NOTE — Progress Notes (Signed)
Patient reported having mild hives to neck and chest. Benadryl 25mg  Iv given. Pump connected. Patient escorted out via wheelchair and educated to be cautious with mobility as the Benadryl may make her a little unsteady on her feet. Pt. Verbalized understanding.

## 2021-04-27 DIAGNOSIS — Z933 Colostomy status: Secondary | ICD-10-CM | POA: Diagnosis not present

## 2021-04-27 LAB — CEA: CEA: 972 ng/mL — ABNORMAL HIGH (ref 0.0–4.7)

## 2021-04-28 ENCOUNTER — Inpatient Hospital Stay: Payer: Medicare Other

## 2021-04-28 VITALS — BP 117/51 | HR 68 | Temp 96.0°F

## 2021-04-28 DIAGNOSIS — R970 Elevated carcinoembryonic antigen [CEA]: Secondary | ICD-10-CM | POA: Diagnosis not present

## 2021-04-28 DIAGNOSIS — E876 Hypokalemia: Secondary | ICD-10-CM | POA: Diagnosis not present

## 2021-04-28 DIAGNOSIS — Z5111 Encounter for antineoplastic chemotherapy: Secondary | ICD-10-CM | POA: Diagnosis not present

## 2021-04-28 DIAGNOSIS — Z923 Personal history of irradiation: Secondary | ICD-10-CM | POA: Diagnosis not present

## 2021-04-28 DIAGNOSIS — Z86711 Personal history of pulmonary embolism: Secondary | ICD-10-CM | POA: Diagnosis not present

## 2021-04-28 DIAGNOSIS — L500 Allergic urticaria: Secondary | ICD-10-CM | POA: Diagnosis not present

## 2021-04-28 DIAGNOSIS — Z5112 Encounter for antineoplastic immunotherapy: Secondary | ICD-10-CM | POA: Diagnosis not present

## 2021-04-28 DIAGNOSIS — K521 Toxic gastroenteritis and colitis: Secondary | ICD-10-CM | POA: Diagnosis not present

## 2021-04-28 DIAGNOSIS — C20 Malignant neoplasm of rectum: Secondary | ICD-10-CM | POA: Diagnosis not present

## 2021-04-28 DIAGNOSIS — T451X5A Adverse effect of antineoplastic and immunosuppressive drugs, initial encounter: Secondary | ICD-10-CM | POA: Diagnosis not present

## 2021-04-28 DIAGNOSIS — Z86718 Personal history of other venous thrombosis and embolism: Secondary | ICD-10-CM | POA: Diagnosis not present

## 2021-04-28 DIAGNOSIS — C799 Secondary malignant neoplasm of unspecified site: Secondary | ICD-10-CM

## 2021-04-28 DIAGNOSIS — Z7901 Long term (current) use of anticoagulants: Secondary | ICD-10-CM | POA: Diagnosis not present

## 2021-04-28 DIAGNOSIS — D701 Agranulocytosis secondary to cancer chemotherapy: Secondary | ICD-10-CM | POA: Diagnosis not present

## 2021-04-28 DIAGNOSIS — C7800 Secondary malignant neoplasm of unspecified lung: Secondary | ICD-10-CM | POA: Diagnosis not present

## 2021-04-28 MED ORDER — HEPARIN SOD (PORK) LOCK FLUSH 100 UNIT/ML IV SOLN
500.0000 [IU] | Freq: Once | INTRAVENOUS | Status: AC | PRN
  Administered 2021-04-28: 500 [IU]
  Filled 2021-04-28: qty 5

## 2021-04-28 MED ORDER — HEPARIN SOD (PORK) LOCK FLUSH 100 UNIT/ML IV SOLN
INTRAVENOUS | Status: AC
  Filled 2021-04-28: qty 5

## 2021-04-28 MED ORDER — PEGFILGRASTIM 6 MG/0.6ML ~~LOC~~ PSKT
6.0000 mg | PREFILLED_SYRINGE | Freq: Once | SUBCUTANEOUS | Status: AC
  Administered 2021-04-28: 6 mg via SUBCUTANEOUS
  Filled 2021-04-28: qty 0.6

## 2021-04-28 MED ORDER — SODIUM CHLORIDE 0.9% FLUSH
10.0000 mL | INTRAVENOUS | Status: DC | PRN
  Filled 2021-04-28: qty 10

## 2021-05-02 DIAGNOSIS — C799 Secondary malignant neoplasm of unspecified site: Secondary | ICD-10-CM | POA: Diagnosis not present

## 2021-05-02 DIAGNOSIS — C20 Malignant neoplasm of rectum: Secondary | ICD-10-CM | POA: Diagnosis not present

## 2021-05-02 DIAGNOSIS — Z933 Colostomy status: Secondary | ICD-10-CM | POA: Diagnosis not present

## 2021-05-03 ENCOUNTER — Other Ambulatory Visit: Payer: Medicare Other

## 2021-05-03 ENCOUNTER — Ambulatory Visit: Payer: Medicare Other | Admitting: Oncology

## 2021-05-03 ENCOUNTER — Ambulatory Visit: Payer: Medicare Other

## 2021-05-03 ENCOUNTER — Ambulatory Visit (INDEPENDENT_AMBULATORY_CARE_PROVIDER_SITE_OTHER): Payer: Medicare Other | Admitting: Podiatry

## 2021-05-03 ENCOUNTER — Other Ambulatory Visit: Payer: Self-pay

## 2021-05-03 DIAGNOSIS — B351 Tinea unguium: Secondary | ICD-10-CM | POA: Diagnosis not present

## 2021-05-03 DIAGNOSIS — D689 Coagulation defect, unspecified: Secondary | ICD-10-CM | POA: Diagnosis not present

## 2021-05-03 DIAGNOSIS — M79675 Pain in left toe(s): Secondary | ICD-10-CM | POA: Diagnosis not present

## 2021-05-03 DIAGNOSIS — M79674 Pain in right toe(s): Secondary | ICD-10-CM | POA: Diagnosis not present

## 2021-05-04 DIAGNOSIS — Z933 Colostomy status: Secondary | ICD-10-CM | POA: Diagnosis not present

## 2021-05-05 ENCOUNTER — Encounter: Payer: Self-pay | Admitting: Podiatry

## 2021-05-05 NOTE — Progress Notes (Signed)
  Subjective:  Patient ID: Jacqueline Yoder, female    DOB: 1946-03-17,  MRN: [id number]  Chief Complaint  Patient presents with   Nail Problem    Nail trim    75 y.o. female returns for the above complaint.  Patient presents with thickened elongated dystrophic toenails x10.  Patient states painful to touch.  She is not able to debride herself.  She would like for me to do it.  She denies any other acute complaints.  She states that she is on Eliquis from previous history of PE.  Objective:  There were no vitals filed for this visit. Podiatric Exam: Vascular: dorsalis pedis and posterior tibial pulses are palpable bilateral. Capillary return is immediate. Temperature gradient is WNL. Skin turgor WNL  Sensorium: Normal Semmes Weinstein monofilament test. Normal tactile sensation bilaterally. Nail Exam: Pt has thick disfigured discolored nails with subungual debris noted bilateral entire nail hallux through fifth toenails.  Pain on palpation to the nails. Ulcer Exam: There is no evidence of ulcer or pre-ulcerative changes or infection. Orthopedic Exam: Muscle tone and strength are WNL. No limitations in general ROM. No crepitus or effusions noted. HAV  B/L.  Hammer toes 2-5  B/L. Skin: No Porokeratosis. No infection or ulcers    Assessment & Plan:   No diagnosis found.   Patient was evaluated and treated and all questions answered.  Onychomycosis with pain  -Nails palliatively debrided as below. -Educated on self-care  Procedure: Nail Debridement Rationale: pain  Type of Debridement: manual, sharp debridement. Instrumentation: Nail nipper, rotary burr. Number of Nails: 10  Procedures and Treatment: Consent by patient was obtained for treatment procedures. The patient understood the discussion of treatment and procedures well. All questions were answered thoroughly reviewed. Debridement of mycotic and hypertrophic toenails, 1 through 5 bilateral and clearing of subungual debris. No  ulceration, no infection noted.  Return Visit-Office Procedure: Patient instructed to return to the office for a follow up visit 3 months for continued evaluation and treatment.  Boneta Lucks, DPM    No follow-ups on file.

## 2021-05-06 ENCOUNTER — Other Ambulatory Visit: Payer: Self-pay | Admitting: Family Medicine

## 2021-05-06 DIAGNOSIS — E876 Hypokalemia: Secondary | ICD-10-CM

## 2021-05-06 MED ORDER — POTASSIUM CHLORIDE CRYS ER 20 MEQ PO TBCR: 20.0000 meq | EXTENDED_RELEASE_TABLET | Freq: Every day | ORAL | 3 refills | Status: DC

## 2021-05-06 NOTE — Telephone Encounter (Signed)
Medication: potassium chloride SA (KLOR-CON) 20 MEQ tablet  Has the pt contacted their pharmacy? No she declined.  Does not have Rx bottle  Preferred pharmacy: [facility] [place] [facility] [address]  Please be advised refills may take up to 3 business days.  We ask that you follow up with your pharmacy.

## 2021-05-06 NOTE — Telephone Encounter (Signed)
   Notes to clinic:  medication was filled by a different provider  Review for continued use and refill   Requested Prescriptions  Pending Prescriptions Disp Refills   potassium chloride SA (KLOR-CON) 20 MEQ tablet 30 tablet 0    Sig: Take 1 tablet (20 mEq total) by mouth daily.      Endocrinology:  Minerals - Potassium Supplementation Passed - 05/06/2021 11:02 AM      Passed - K in normal range and within 360 days    Potassium  Date Value Ref Range Status  04/26/2021 3.5 3.5 - 5.1 mmol/L Final          Passed - Cr in normal range and within 360 days    Creatinine, Ser  Date Value Ref Range Status  04/26/2021 0.92 0.44 - 1.00 mg/dL Final          Passed - Valid encounter within last 12 months    Recent Outpatient Visits           4 months ago Benign hypertension with CKD (chronic kidney disease), stage II   [place], DO   7 months ago [place], DO       Future Appointments             In 2 weeks Borders, Kirt Boys, NP [facility]   In 1 month [place], Devonne Doughty, [facility], [facility]

## 2021-05-10 ENCOUNTER — Inpatient Hospital Stay: Payer: Medicare Other

## 2021-05-10 ENCOUNTER — Inpatient Hospital Stay (HOSPITAL_BASED_OUTPATIENT_CLINIC_OR_DEPARTMENT_OTHER): Payer: Medicare Other | Admitting: Oncology

## 2021-05-10 ENCOUNTER — Encounter: Payer: Self-pay | Admitting: Oncology

## 2021-05-10 VITALS — BP 125/59 | HR 65 | Resp 18

## 2021-05-10 VITALS — BP 130/71 | HR 73 | Temp 96.7°F | Resp 18 | Ht 65.0 in | Wt 173.1 lb

## 2021-05-10 DIAGNOSIS — D701 Agranulocytosis secondary to cancer chemotherapy: Secondary | ICD-10-CM

## 2021-05-10 DIAGNOSIS — E876 Hypokalemia: Secondary | ICD-10-CM | POA: Diagnosis not present

## 2021-05-10 DIAGNOSIS — Z7901 Long term (current) use of anticoagulants: Secondary | ICD-10-CM | POA: Diagnosis not present

## 2021-05-10 DIAGNOSIS — Z95828 Presence of other vascular implants and grafts: Secondary | ICD-10-CM

## 2021-05-10 DIAGNOSIS — C20 Malignant neoplasm of rectum: Secondary | ICD-10-CM

## 2021-05-10 DIAGNOSIS — K521 Toxic gastroenteritis and colitis: Secondary | ICD-10-CM

## 2021-05-10 DIAGNOSIS — Z86711 Personal history of pulmonary embolism: Secondary | ICD-10-CM

## 2021-05-10 DIAGNOSIS — T451X5A Adverse effect of antineoplastic and immunosuppressive drugs, initial encounter: Secondary | ICD-10-CM

## 2021-05-10 DIAGNOSIS — C799 Secondary malignant neoplasm of unspecified site: Secondary | ICD-10-CM

## 2021-05-10 DIAGNOSIS — Z5111 Encounter for antineoplastic chemotherapy: Secondary | ICD-10-CM | POA: Diagnosis not present

## 2021-05-10 DIAGNOSIS — L500 Allergic urticaria: Secondary | ICD-10-CM | POA: Diagnosis not present

## 2021-05-10 DIAGNOSIS — C7800 Secondary malignant neoplasm of unspecified lung: Secondary | ICD-10-CM | POA: Diagnosis not present

## 2021-05-10 DIAGNOSIS — Z86718 Personal history of other venous thrombosis and embolism: Secondary | ICD-10-CM | POA: Diagnosis not present

## 2021-05-10 DIAGNOSIS — R970 Elevated carcinoembryonic antigen [CEA]: Secondary | ICD-10-CM | POA: Diagnosis not present

## 2021-05-10 DIAGNOSIS — Z923 Personal history of irradiation: Secondary | ICD-10-CM | POA: Diagnosis not present

## 2021-05-10 DIAGNOSIS — Z5112 Encounter for antineoplastic immunotherapy: Secondary | ICD-10-CM | POA: Diagnosis not present

## 2021-05-10 LAB — COMPREHENSIVE METABOLIC PANEL
ALT: 16 U/L (ref 0–44)
AST: 30 U/L (ref 15–41)
Albumin: 4.2 g/dL (ref 3.5–5.0)
Alkaline Phosphatase: 68 U/L (ref 38–126)
Anion gap: 10 (ref 5–15)
BUN: 9 mg/dL (ref 8–23)
CO2: 26 mmol/L (ref 22–32)
Calcium: 9.3 mg/dL (ref 8.9–10.3)
Chloride: 101 mmol/L (ref 98–111)
Creatinine, Ser: 1.02 mg/dL — ABNORMAL HIGH (ref 0.44–1.00)
GFR, Estimated: 58 mL/min — ABNORMAL LOW (ref >60)
Glucose, Bld: 102 mg/dL — ABNORMAL HIGH (ref 70–99)
Potassium: 3.3 mmol/L — ABNORMAL LOW (ref 3.5–5.1)
Sodium: 137 mmol/L (ref 135–145)
Total Bilirubin: 0.4 mg/dL (ref 0.3–1.2)
Total Protein: 6.9 g/dL (ref 6.5–8.1)

## 2021-05-10 LAB — CBC WITH DIFFERENTIAL/PLATELET
Abs Immature Granulocytes: 0.36 10*3/uL — ABNORMAL HIGH (ref 0.00–0.07)
Basophils Absolute: 0.1 10*3/uL (ref 0.0–0.1)
Basophils Relative: 1 %
Eosinophils Absolute: 0.1 10*3/uL (ref 0.0–0.5)
Eosinophils Relative: 2 %
HCT: 36.8 % (ref 36.0–46.0)
Hemoglobin: 11.9 g/dL — ABNORMAL LOW (ref 12.0–15.0)
Immature Granulocytes: 6 %
Lymphocytes Relative: 29 %
Lymphs Abs: 1.8 10*3/uL (ref 0.7–4.0)
MCH: 31.4 pg (ref 26.0–34.0)
MCHC: 32.3 g/dL (ref 30.0–36.0)
MCV: 97.1 fL (ref 80.0–100.0)
Monocytes Absolute: 0.6 10*3/uL (ref 0.1–1.0)
Monocytes Relative: 9 %
Neutro Abs: 3.2 10*3/uL (ref 1.7–7.7)
Neutrophils Relative %: 53 %
Platelets: 204 10*3/uL (ref 150–400)
RBC: 3.79 MIL/uL — ABNORMAL LOW (ref 3.87–5.11)
RDW: 17.4 % — ABNORMAL HIGH (ref 11.5–15.5)
WBC: 6.1 10*3/uL (ref 4.0–10.5)
nRBC: 0.7 % — ABNORMAL HIGH (ref 0.0–0.2)

## 2021-05-10 LAB — PROTEIN, URINE, RANDOM: Total Protein, Urine: 21 mg/dL

## 2021-05-10 MED ORDER — SODIUM CHLORIDE 0.9 % IV SOLN
Freq: Once | INTRAVENOUS | Status: AC
  Administered 2021-05-10: 1000 mL via INTRAVENOUS
  Filled 2021-05-10: qty 250

## 2021-05-10 MED ORDER — DIPHENHYDRAMINE HCL 50 MG/ML IJ SOLN
25.0000 mg | Freq: Once | INTRAMUSCULAR | Status: AC
  Administered 2021-05-10: 25 mg via INTRAVENOUS
  Filled 2021-05-10: qty 1

## 2021-05-10 MED ORDER — SODIUM CHLORIDE 0.9 % IV SOLN
150.0000 mg/m2 | Freq: Once | INTRAVENOUS | Status: AC
  Administered 2021-05-10: 280 mg via INTRAVENOUS
  Filled 2021-05-10: qty 10

## 2021-05-10 MED ORDER — PALONOSETRON HCL INJECTION 0.25 MG/5ML
0.2500 mg | Freq: Once | INTRAVENOUS | Status: AC
  Administered 2021-05-10: 0.25 mg via INTRAVENOUS
  Filled 2021-05-10: qty 5

## 2021-05-10 MED ORDER — FLUOROURACIL CHEMO INJECTION 2.5 GM/50ML
400.0000 mg/m2 | Freq: Once | INTRAVENOUS | Status: AC
  Administered 2021-05-10: 750 mg via INTRAVENOUS
  Filled 2021-05-10: qty 15

## 2021-05-10 MED ORDER — SODIUM CHLORIDE 0.9 % IV SOLN
10.0000 mg | Freq: Once | INTRAVENOUS | Status: AC
  Administered 2021-05-10: 10 mg via INTRAVENOUS
  Filled 2021-05-10: qty 10

## 2021-05-10 MED ORDER — SODIUM CHLORIDE 0.9 % IV SOLN
5.0000 mg/kg | Freq: Once | INTRAVENOUS | Status: AC
  Administered 2021-05-10: 400 mg via INTRAVENOUS
  Filled 2021-05-10: qty 16

## 2021-05-10 MED ORDER — SODIUM CHLORIDE 0.9% FLUSH
10.0000 mL | Freq: Once | INTRAVENOUS | Status: AC
  Administered 2021-05-10: 10 mL via INTRAVENOUS
  Filled 2021-05-10: qty 10

## 2021-05-10 MED ORDER — SODIUM CHLORIDE 0.9 % IV SOLN
750.0000 mg | Freq: Once | INTRAVENOUS | Status: AC
  Administered 2021-05-10: 750 mg via INTRAVENOUS
  Filled 2021-05-10: qty 25

## 2021-05-10 MED ORDER — SODIUM CHLORIDE 0.9 % IV SOLN
2400.0000 mg/m2 | INTRAVENOUS | Status: DC
  Administered 2021-05-10: 4500 mg via INTRAVENOUS
  Filled 2021-05-10: qty 90

## 2021-05-10 MED ORDER — SODIUM CHLORIDE 0.9 % IV SOLN
Freq: Once | INTRAVENOUS | Status: AC
  Filled 2021-05-10: qty 250

## 2021-05-10 MED ORDER — ATROPINE SULFATE 1 MG/ML IJ SOLN
0.5000 mg | Freq: Once | INTRAMUSCULAR | Status: AC
  Administered 2021-05-10: 0.5 mg via INTRAVENOUS
  Filled 2021-05-10: qty 1

## 2021-05-10 MED ORDER — MONTELUKAST SODIUM 10 MG PO TABS
10.0000 mg | ORAL_TABLET | Freq: Once | ORAL | Status: AC
  Administered 2021-05-10: 10 mg via ORAL
  Filled 2021-05-10: qty 1

## 2021-05-10 NOTE — Progress Notes (Signed)
Hematology/Oncology progress note [facility] Telephone:([phone] [phone]   Patient Care Team: Olin Hauser, DO as PCP - General (Family Medicine) Clent Jacks, RN as Registered Nurse Earlie Server, MD as Consulting Physician (Hematology and Oncology)  CHIEF COMPLAINTS/REASON FOR VISIT:  Follow up for rectal cancer  HISTORY OF PRESENTING ILLNESS:  Patient initially presented with complaints of postmenopausal bleeding on 08/16/2018.  History of was menopausal vaginal bleeding in 2016 which resulted in cervical polypectomy.  Pathology 02/04/2015 showed cervical polyp, consistent with benign endometrial polyp.  [name] after polypectomy due to anxiety associated with pelvic exams.  pelvic exam on 08/16/2018 reviewed cervical abnormality and from enlarged uterus. Seen by Dr. Marcelline Mates on 10/29/2018.  Endometrial biopsy and a Pap smear was performed. 10/29/2018 Pap smear showed adenocarcinoma, favor endometrial origin. 10/29/2018 endometrial biopsy showed endometrioid carcinoma, FIGO grade 1.  10/29/2018- TA & TV Ultrasound revealed: Anteverted uterus measuring 8.7 x 5.6 x 6.4 cm without evidence of focal masses.  The endometrium measuring 24.1 mm (thickened) and heterogeneous.  Right and left ovaries not visualized.  No adnexal masses identified.  No free fluid in cul-de-sac.  Patient was seen by Dr. Theora Gianotti in clinic on 11/13/2018.  Cervical exam reveals 2 cm exophytic irregular mass consistent with malignancy.   11/19/2018 CT chest abdomen pelvis with contrast showed thickened endometrium with some irregularity compatible with the provided diagnosis of endometrial malignancy.  There is a mildly prominent left inguinal node 1.4 cm.  Patient was seen by Dr. Fransisca Connors on 11/20/2018 and left groin lymph node biopsy was recommended.  11/26/2018 patient underwent left inguinal lymph node biopsy. Pathology showed metastatic adenocarcinoma consistent  with colorectal origin.  CDX 2+.  Case was discussed on tumor board.  Recommend colonoscopy for further evaluation.  Patient reports significant weight loss 30 pounds over the last year.  Chronic vaginal spotting. Change of bowel habits the past few months.  More constipated.  Family history positive for brother who has colon cancer prostate cancer.  patient has underwent colonoscopy on 12/03/2018 which reviewed a nonobstructing large mass in the rectum.  Also chronic fistula.  Mass was not circumferential.  This was biopsied with a cold forceps for histology.  Pathology came back hyperplastic polyp negative for dysplasia and malignancy. [name], patient underwent flex sigmoidoscopy on 12/06/2018 with rebiopsy of the rectal mass. This time biopsy results came back positive for invasive colorectal adenocarcinoma, moderately differentiated. Immunotherapy for nearly mismatch repair protein (MMR ) was performed.  There is no loss of MMR expression.  low probability of MSI high.   # Seen by [name] for evaluation of resectability for rectal cancer. In addition, she also had a second opinion with [facility] where her endometrial biopsy pathology was changed to  adenocarcinoma, consistent with colorectal primary.   [name]. She has home health that has been assisting with ostomy care  Patient was also evaluated by [facility] oncology.  Recommendation is to proceed with TNT with concurrent chemoradiation followed by neoadjuvant chemotherapy followed by surgical resection. Patient prefers to have treatment done locally with [facility].   # Oncology Treatment:  02/03/2019- 03/19/2019  concurrent Xeloda and radiation.  Xeloda dose 816m /m2 BID - rounded to 16538mBID- on days of radiation. 04/09/2019, started on FOLFOX with bolus 5-FU omitted.  07/16/2019 finished 8 cycles of FOLFOX. 09/17/19 APR/posterior vaginectomy/TAH/BSO/VY-flap pT4b pN0 with  close vaginal margin 0.2 mm.  Uterus and ovaries negative  for malignancy. Patient reports bilateral lower extremity numbness and tingling, intermittent, left worse than right. She has lost a lot of weight since her APR surgery.   #Family history with half brother having's history of colon cancer prostate cancer.  Personal history of colorectal cancer.  Patient has not decided if she wants genetic testing.    # history of PE( 01/13/2020)  in the bilateral lower extremity DVT (01/13/2020).   She finishes 6 months of anticoagulation with Eliquis 5 mg twice daily. Now switched to Eliquis 2.5 mg twice daily..  # She has now developed recurrent disease. #06/30/20  vaginal introitus mass biopsied. Pathology is consistent with metastatic colorectal adenocarcinoma I have discussed with [name]  Dr. Hester Mates and the mass is not resectable. Patient has also had colonoscopy by Dr. Vicente Males yesterday. Normal examination. # 07/16/2020 cycle 1 FOLFIRI  # 07/20/2020 PET scan was done for further evaluation, images are consistent with local recurrence, no distant metastasis. #Discussed with radiation oncology Dr. Baruch Gouty will recommends concurrent chemotherapy and radiation. 08/02/2020-08/16/2020, patient starts radiation.  Xeloda was held due to neutropenia 08/17/2020,-09/06/2020 Xeloda 1500 mg twice daily concurrently with radiation  01/31/21 started on FOLFIRI + Bev   INTERVAL HISTORY Jacqueline Yoder is a 75 y.o. female who has above history reviewed by me presents for follow-up of rectal cancer. She tolerates chemotherapy well.   Weight is stable.  Denies any abdominal pain, nausea or vomiting.  Intermittent  She has gained 1 pound since last visit.  Denies any abdominal pain, nausea vomiting  She has no diarrhea during the interval, except this morning, she had a loose bowel movement.    Review of Systems  Constitutional:  Negative for appetite change, chills, fatigue, fever and unexpected weight change.   HENT:   Negative for hearing loss and voice change.   Eyes:  Negative for eye problems.  Respiratory:  Negative for chest tightness and cough.   Cardiovascular:  Negative for chest pain.  Gastrointestinal:  Positive for diarrhea. Negative for abdominal distention, abdominal pain, blood in stool, constipation and nausea.  Endocrine: Negative for hot flashes.  Genitourinary:  Negative for difficulty urinating and frequency.   Musculoskeletal:  Negative for arthralgias.  Skin:  Negative for itching and rash.  Neurological:  Negative for extremity weakness and numbness.  Hematological:  Negative for adenopathy.  Psychiatric/Behavioral:  Negative for confusion.    MEDICAL HISTORY:  Past Medical History:  Diagnosis Date   Allergy    Arthritis    Blood clot in vein    Family history of colon cancer    GERD (gastroesophageal reflux disease)    Hypercholesteremia    Hypertension    Hypertension    Lower extremity edema    Rectal cancer ([place]) 12/2018   Urinary incontinence     SURGICAL HISTORY: Past Surgical History:  Procedure Laterality Date   ABDOMINAL HYSTERECTOMY     CHOLECYSTECTOMY  1971   COLONOSCOPY WITH PROPOFOL N/A 12/03/2018   Procedure: COLONOSCOPY WITH PROPOFOL;  Surgeon: Lucilla Lame, MD;  Location: [facility] ENDOSCOPY;  Service: Endoscopy;  Laterality: N/A;   COLONOSCOPY WITH PROPOFOL N/A 07/15/2020   Procedure: COLONOSCOPY WITH PROPOFOL;  Surgeon: [name], MD;  Location[facility] ENDOSCOPY;  Service: Gastroenterology;  Laterality: N/A;   FLEXIBLE SIGMOIDOSCOPY N/A 12/06/2018   Procedure: FLEXIBLE SIGMOIDOSCOPY;  Surgeon: [name], MD;  Location: [facility] ENDOSCOPY;  Service: Endoscopy;  Laterality: N/A;   LAPAROSCOPIC COLOSTOMY  01/06/2019   PORTACATH PLACEMENT N/A 04/03/2019   Procedure: INSERTION PORT-A-CATH;  Surgeon: Jules Husbands, MD;  Location: [facility] ORS;  Service: General;  Laterality: N/A;    SOCIAL HISTORY: Social History   Socioeconomic History   Marital  status: Married    Spouse name: Not on file   Number of children: Not on file   Years of education: Not on file   Highest education level: Not on file  Occupational History   Not on file  Tobacco Use   Smoking status: Former    Types: Cigarettes    Quit date: 12/02/1977    Years since quitting: 43.4   Smokeless tobacco: Former  Scientific laboratory technician Use: Never used  Substance and Sexual Activity   Alcohol use: Never   Drug use: Never   Sexual activity: Not Currently    Birth control/protection: None  Other Topics Concern   Not on file  Social History Narrative   Lives with daughter   Social Determinants of Health   Financial Resource Strain: Not on file  Food Insecurity: Not on file  Transportation Needs: Not on file  Physical Activity: Not on file  Stress: Not on file  Social Connections: Not on file  Intimate Partner Violence: Not on file    FAMILY HISTORY: Family History  Problem Relation Age of Onset   Colon cancer Brother 14       exposure to chemicals [place]   Hypertension Mother    Stroke Mother    Kidney failure Father    Breast cancer Neg Hx    Ovarian cancer Neg Hx     ALLERGIES:  is allergic to sulfamethoxazole-trimethoprim.  MEDICATIONS:  Current Outpatient Medications  Medication Sig Dispense Refill   apixaban (ELIQUIS) 2.5 MG TABS tablet Take 1 tablet (2.5 mg total) by mouth 2 (two) times daily. 180 tablet 1   Cholecalciferol (VITAMIN D3) 2000 units capsule Take 2,000 Units by mouth daily.     diclofenac sodium (VOLTAREN) 1 % GEL Apply 2 g topically 4 (four) times daily as needed (joint pain).  11   docusate sodium (COLACE) 100 MG capsule Take 100 mg by mouth 2 (two) times daily as needed.     fluticasone (FLONASE) 50 MCG/ACT nasal spray Place 1 spray into both nostrils daily. 16 g 1   gabapentin (NEURONTIN) 100 MG capsule Take 1 capsule (100 mg total) by mouth at bedtime. 30 capsule 0   ipratropium (ATROVENT) 0.03 % nasal spray Place 1 spray  into both nostrils 2 (two) times daily. 30 mL 2   lidocaine-prilocaine (EMLA) cream Apply 1 application topically as needed. 30 g 6   loperamide (IMODIUM) 2 MG capsule Take 1 capsule (2 mg total) by mouth See admin instructions. With onset of loose stool, take 75m followed by 260mevery 2 hours,  Maximum: 16 mg/day 120 capsule 1   loratadine (CLARITIN) 10 MG tablet Take 10 mg by mouth daily.     Multiple Vitamins-Minerals (ONE-A-DAY WOMENS 50 PLUS PO) Take 1 tablet by mouth daily.      ondansetron (ZOFRAN) 8 MG tablet Take 1 tablet (8 mg total) by mouth 2 (two) times daily as needed for refractory nausea / vomiting. Start on day 3 after chemotherapy. 30 tablet 1   potassium chloride SA (KLOR-CON) 20 MEQ tablet Take 1 tablet (20 mEq total) by mouth daily. 90 tablet 3   prochlorperazine (COMPAZINE) 10 MG tablet Take 1 tablet (10 mg total) by mouth every 6 (six) hours as needed (NAUSEA). 30 tablet 1   simvastatin (ZOCOR) 40 MG  tablet Take 40 mg by mouth at bedtime.      triamterene-hydrochlorothiazide (DYAZIDE) 37.5-25 MG capsule Take 1 capsule by mouth daily.      zinc gluconate 50 MG tablet Take 50 mg by mouth daily.     No current facility-administered medications for this visit.     PHYSICAL EXAMINATION: ECOG PERFORMANCE STATUS: 1 - Symptomatic but completely ambulatory  Physical Exam Constitutional:      General: She is not in acute distress. HENT:     Head: Normocephalic and atraumatic.  Eyes:     General: No scleral icterus. Cardiovascular:     Rate and Rhythm: Normal rate and regular rhythm.     Heart sounds: Normal heart sounds.  Pulmonary:     Effort: Pulmonary effort is normal. No respiratory distress.     Breath sounds: No wheezing.  Abdominal:     General: Bowel sounds are normal. There is no distension.     Palpations: Abdomen is soft.     Comments: + Colostomy bag   Musculoskeletal:        General: No deformity. Normal range of motion.     Cervical back: Normal range  of motion and neck supple.  Skin:    General: Skin is warm and dry.     Findings: No erythema or rash.  Neurological:     Mental Status: She is alert and oriented to person, place, and time. Mental status is at baseline.     Cranial Nerves: No cranial nerve deficit.     Coordination: Coordination normal.  Psychiatric:     Comments: Grieving   CMP Latest Ref Rng & Units 04/26/2021  Glucose 70 - 99 mg/dL 100(H)  BUN 8 - 23 mg/dL 8  Creatinine 0.44 - 1.00 mg/dL 0.92  Sodium 135 - 145 mmol/L 137  Potassium 3.5 - 5.1 mmol/L 3.5  Chloride 98 - 111 mmol/L 101  CO2 22 - 32 mmol/L 26  Calcium 8.9 - 10.3 mg/dL 9.3  Total Protein 6.5 - 8.1 g/dL 7.0  Total Bilirubin 0.3 - 1.2 mg/dL 0.5  Alkaline Phos 38 - 126 U/L 74  AST 15 - 41 U/L 29  ALT 0 - 44 U/L 18   CBC Latest Ref Rng & Units 04/26/2021  WBC 4.0 - 10.5 K/uL 6.8  Hemoglobin 12.0 - 15.0 g/dL 11.9(L)  Hematocrit 36.0 - 46.0 % 37.2  Platelets 150 - 400 K/uL 199    LABORATORY DATA:  I have reviewed the data as listed Lab Results  Component Value Date   WBC 6.8 04/26/2021   HGB 11.9 (L) 04/26/2021   HCT 37.2 04/26/2021   MCV 96.1 04/26/2021   PLT 199 04/26/2021   Recent Labs    06/23/20 1124 07/16/20 0802 07/23/20 1345 03/29/21 0836 04/12/21 0803 04/26/21 0829  NA 140 140   < > 137 136 137  K 3.7 3.2*   < > 3.5 3.5 3.5  CL 100 101   < > 99 100 101  CO2 30 29   < > '28 28 26  ' GLUCOSE 114* 114*   < > 102* 102* 100*  BUN 10 9   < > '8 8 8  ' CREATININE 0.97 0.87   < > 0.98 0.77 0.92  CALCIUM 9.1 9.2   < > 9.4 9.3 9.3  GFRNONAA 58* >60   < > >60 >60 >60  GFRAA >60 >60  --   --   --   --   PROT 7.0 6.9   < >  7.3 6.8 7.0  ALBUMIN 4.0 3.6   < > 4.3 4.0 4.1  AST 25 24   < > '27 31 29  ' ALT 15 14   < > '15 19 18  ' ALKPHOS 41 41   < > 71 73 74  BILITOT 0.4 0.5   < > 0.6 0.8 0.5   < > = values in this interval not displayed.    Iron/TIBC/Ferritin/ %Sat    Component Value Date/Time   IRON 40 06/23/2020 1124   TIBC 246 (L)  06/23/2020 1124   FERRITIN 338 (H) 06/23/2020 1124   IRONPCTSAT 16 06/23/2020 1124     RADIOGRAPHIC STUDIES: I have personally reviewed the radiological images as listed and agreed with the findings in the report. No results found.    No results found.   ASSESSMENT & PLAN:  1. Metastatic adenocarcinoma ([place])   2. Encounter for antineoplastic chemotherapy   3. Chemotherapy induced neutropenia (HCC)   4. History of pulmonary embolism   5. History of DVT (deep vein thrombosis)   6. Chemotherapy induced diarrhea   Cancer Staging Rectal cancer [facility]) Staging form: Colon and Rectum, AJCC 8th Edition - Clinical stage from 01/23/2019: Stage IIIC (cT4b, cN1a, cM0) - Signed by Earlie Server, MD on 01/23/2019 - Pathologic stage from 10/06/2019: Stage IIC (ypT4b, pN0, cM0) - Signed by Earlie Server, MD on 10/06/2019 - Pathologic: Stage Unknown (rpTX, pNX, cM1) - Signed by Earlie Server, MD on 01/31/2021  #History of stage IIIC Rectal cancer, s/p TNT, followed by 09/17/19 APR/posterior vaginectomy/TAH/BSO/VY-flap, pT4b pN0 with close vaginal margin 0.2 mm.  Uterus and ovaries negative for malignancy. palliative radiation to vaginal recurrence.  Finished 09/04/2020 01/19/21 recurrence with lung metastasis.-Palliative chemotherapy Labs are reviewed and discussed with patient. Proceed with FOLFIRI + bevacizumab today.  CEA has decreased.  -Neulasta on Day 3, Claritin for 4 days. Check CT chest abdomen pelvis for evaluation of treatment response.   # Post treatment hives, likely due to chemotherapy. She gets additional one dose of IV benadryl 22m x 1. Recommend her to take oral benadryl as instructed PRN hive at home.   #Chemotherapy-induced neutropenia, see above.  On GCSF  #Chemotherapy-induced diarrhea, symptoms are stable.  As needed antidiarrhea medications..Marland Kitchen #Hypokalemia, potassium 3.3, continue potassium chloride 20 mEq daily.    #History of pulmonary embolism, history of bilateral DVT continue Eliquis  2.5 mg twice daily.   Follow-up 2 weeks for next cycle of treatment.  ZEarlie Server MD, PhD

## 2021-05-10 NOTE — Progress Notes (Signed)
Pt here for follow up. No new concerns voiced.   

## 2021-05-10 NOTE — Progress Notes (Signed)
1301- Post Irinotecan and Leucovorin, patient reports and staff notes hives present on patient's chest and neck. Patient reports this has happened before and she ususally develops hives with Irinotecan treatments. Patient denies any other symptoms at this time. MD, Dr. Tasia Catchings, notified and aware. Per MD order: administer Benadryl 25 mg IV once at this time; Post Benadryl administration, proceed with remainder of today's treatment, Fluorouracil, and patient may be discharged to home with Fluorouracil Continuous Infusion Pump in place. Patient reports she is not driving and her daughter is picking her up today. Patient and vital signs stable. Patient educated to notify clinic of any further issues or in case of an emergency, go to emergency department. Patient verbalized understanding and discharged to home.

## 2021-05-10 NOTE — Patient Instructions (Signed)
[facility]   Discharge Instructions: Thank you for choosing [facility] to provide your oncology and hematology care.  If you have a lab appointment with the [facility], please go directly to the [facility] and check in at the registration area.  Wear comfortable clothing and clothing appropriate for easy access to any Portacath or PICC line.   We strive to give you quality time with your provider. You may need to reschedule your appointment if you arrive late (15 or more minutes).  Arriving late affects you and other patients whose appointments are after yours.  Also, if you miss three or more appointments without notifying the office, you may be dismissed from the clinic at the provider's discretion.      For prescription refill requests, have your pharmacy contact our office and allow 72 hours for refills to be completed.    Today you received the following chemotherapy and/or immunotherapy agents: Bevacizumab-awwb (MVASI), Irinotecan, Leucovorin, Fluorouracil.      To help prevent nausea and vomiting after your treatment, we encourage you to take your nausea medication as directed.  BELOW ARE SYMPTOMS THAT SHOULD BE REPORTED IMMEDIATELY: *FEVER GREATER THAN 100.4 F (38 C) OR HIGHER *CHILLS OR SWEATING *NAUSEA AND VOMITING THAT IS NOT CONTROLLED WITH YOUR NAUSEA MEDICATION *UNUSUAL SHORTNESS OF BREATH *UNUSUAL BRUISING OR BLEEDING *URINARY PROBLEMS (pain or burning when urinating, or frequent urination) *BOWEL PROBLEMS (unusual diarrhea, constipation, pain near the anus) TENDERNESS IN MOUTH AND THROAT WITH OR WITHOUT PRESENCE OF ULCERS (sore throat, sores in mouth, or a toothache) UNUSUAL RASH, SWELLING OR PAIN  UNUSUAL VAGINAL DISCHARGE OR ITCHING   Items with * indicate a potential emergency and should be followed up as soon as possible or go to the Emergency Department if any problems should occur.  Please show the  CHEMOTHERAPY ALERT CARD or IMMUNOTHERAPY ALERT CARD at check-in to the Emergency Department and triage nurse.  Should you have questions after your visit or need to cancel or reschedule your appointment, please contact [facility]  [phone] and follow the prompts.  Office hours are 8:00 a.m. to 4:30 p.m. Monday - Friday. Please note that voicemails left after 4:00 p.m. may not be returned until the following business day.  We are closed weekends and major holidays. You have access to a nurse at all times for urgent questions. Please call the main number to the clinic [phone] and follow the prompts.  For any non-urgent questions, you may also contact your provider using MyChart. We now offer e-Visits for anyone 37 and older to request care online for non-urgent symptoms. For details visit [email or web address].   Also download the MyChart app! Go to the app store, search "MyChart", open the app, select [facility], and log in with your MyChart username and password.  Due to Covid, a mask is required upon entering the hospital/clinic. If you do not have a mask, one will be given to you upon arrival. For doctor visits, patients may have 1 support person aged 16 or older with them. For treatment visits, patients cannot have anyone with them due to current Covid guidelines and our immunocompromised population.

## 2021-05-11 LAB — CEA: CEA: 861 ng/mL — ABNORMAL HIGH (ref 0.0–4.7)

## 2021-05-12 ENCOUNTER — Other Ambulatory Visit: Payer: Self-pay

## 2021-05-12 ENCOUNTER — Inpatient Hospital Stay: Payer: Medicare Other

## 2021-05-12 DIAGNOSIS — Z86718 Personal history of other venous thrombosis and embolism: Secondary | ICD-10-CM | POA: Diagnosis not present

## 2021-05-12 DIAGNOSIS — T451X5A Adverse effect of antineoplastic and immunosuppressive drugs, initial encounter: Secondary | ICD-10-CM | POA: Diagnosis not present

## 2021-05-12 DIAGNOSIS — R970 Elevated carcinoembryonic antigen [CEA]: Secondary | ICD-10-CM | POA: Diagnosis not present

## 2021-05-12 DIAGNOSIS — Z5112 Encounter for antineoplastic immunotherapy: Secondary | ICD-10-CM | POA: Diagnosis not present

## 2021-05-12 DIAGNOSIS — C20 Malignant neoplasm of rectum: Secondary | ICD-10-CM

## 2021-05-12 DIAGNOSIS — E876 Hypokalemia: Secondary | ICD-10-CM | POA: Diagnosis not present

## 2021-05-12 DIAGNOSIS — Z5111 Encounter for antineoplastic chemotherapy: Secondary | ICD-10-CM | POA: Diagnosis not present

## 2021-05-12 DIAGNOSIS — L500 Allergic urticaria: Secondary | ICD-10-CM | POA: Diagnosis not present

## 2021-05-12 DIAGNOSIS — Z7901 Long term (current) use of anticoagulants: Secondary | ICD-10-CM | POA: Diagnosis not present

## 2021-05-12 DIAGNOSIS — C799 Secondary malignant neoplasm of unspecified site: Secondary | ICD-10-CM

## 2021-05-12 DIAGNOSIS — D701 Agranulocytosis secondary to cancer chemotherapy: Secondary | ICD-10-CM | POA: Diagnosis not present

## 2021-05-12 DIAGNOSIS — Z923 Personal history of irradiation: Secondary | ICD-10-CM | POA: Diagnosis not present

## 2021-05-12 DIAGNOSIS — K521 Toxic gastroenteritis and colitis: Secondary | ICD-10-CM | POA: Diagnosis not present

## 2021-05-12 DIAGNOSIS — Z86711 Personal history of pulmonary embolism: Secondary | ICD-10-CM | POA: Diagnosis not present

## 2021-05-12 DIAGNOSIS — C7800 Secondary malignant neoplasm of unspecified lung: Secondary | ICD-10-CM | POA: Diagnosis not present

## 2021-05-12 MED ORDER — TRIAMTERENE-HCTZ 37.5-25 MG PO CAPS: 1.0000 | ORAL_CAPSULE | Freq: Every day | ORAL | 3 refills | Status: DC

## 2021-05-12 MED ORDER — HEPARIN SOD (PORK) LOCK FLUSH 100 UNIT/ML IV SOLN
500.0000 [IU] | Freq: Once | INTRAVENOUS | Status: AC | PRN
  Administered 2021-05-12: 500 [IU]
  Filled 2021-05-12: qty 5

## 2021-05-12 MED ORDER — PEGFILGRASTIM 6 MG/0.6ML ~~LOC~~ PSKT
6.0000 mg | PREFILLED_SYRINGE | Freq: Once | SUBCUTANEOUS | Status: AC
  Administered 2021-05-12: 6 mg via SUBCUTANEOUS

## 2021-05-12 MED ORDER — HEPARIN SOD (PORK) LOCK FLUSH 100 UNIT/ML IV SOLN
INTRAVENOUS | Status: AC
  Filled 2021-05-12: qty 5

## 2021-05-12 MED ORDER — SIMVASTATIN 40 MG PO TABS: 40.0000 mg | ORAL_TABLET | Freq: Every day | ORAL | 3 refills | Status: DC

## 2021-05-12 MED ORDER — SODIUM CHLORIDE 0.9% FLUSH
10.0000 mL | INTRAVENOUS | Status: DC | PRN
  Administered 2021-05-12: 10 mL
  Filled 2021-05-12: qty 10

## 2021-05-12 NOTE — Patient Instructions (Signed)
Pegfilgrastim injection What is this medication? PEGFILGRASTIM (PEG fil gra stim) is a long-acting granulocyte colony-stimulating factor that stimulates the growth of neutrophils, a type of white blood cell important in the body's fight against infection. It is used to reduce the incidence of fever and infection in patients with certain types of cancer who are receiving chemotherapy that affects the bone marrow, and to increase survival after being exposed to high doses of radiation. This medicine may be used for other purposes; ask your health care provider or pharmacist if you have questions. COMMON BRAND NAME(S): Fulphila, Neulasta, Nyvepria, UDENYCA, Ziextenzo What should I tell my care team before I take this medication? They need to know if you have any of these conditions: kidney disease latex allergy ongoing radiation therapy sickle cell disease skin reactions to acrylic adhesives (On-Body Injector only) an unusual or allergic reaction to pegfilgrastim, filgrastim, other medicines, foods, dyes, or preservatives pregnant or trying to get pregnant breast-feeding How should I use this medication? This medicine is for injection under the skin. If you get this medicine at home, you will be taught how to prepare and give the pre-filled syringe or how to use the On-body Injector. Refer to the patient Instructions for Use for detailed instructions. Use exactly as directed. Tell your healthcare provider immediately if you suspect that the On-body Injector may not have performed as intended or if you suspect the use of the On-body Injector resulted in a missed or partial dose. It is important that you put your used needles and syringes in a special sharps container. Do not put them in a trash can. If you do not have a sharps container, call your pharmacist or healthcare provider to get one. Talk to your pediatrician regarding the use of this medicine in children. While this drug may be prescribed for  selected conditions, precautions do apply. Overdosage: If you think you have taken too much of this medicine contact a poison control center or emergency room at once. NOTE: This medicine is only for you. Do not share this medicine with others. What if I miss a dose? It is important not to miss your dose. Call your doctor or health care professional if you miss your dose. If you miss a dose due to an On-body Injector failure or leakage, a new dose should be administered as soon as possible using a single prefilled syringe for manual use. What may interact with this medication? Interactions have not been studied. This list may not describe all possible interactions. Give your health care provider a list of all the medicines, herbs, non-prescription drugs, or dietary supplements you use. Also tell them if you smoke, drink alcohol, or use illegal drugs. Some items may interact with your medicine. What should I watch for while using this medication? Your condition will be monitored carefully while you are receiving this medicine. You may need blood work done while you are taking this medicine. Talk to your health care provider about your risk of cancer. You may be more at risk for certain types of cancer if you take this medicine. If you are going to need a MRI, CT scan, or other procedure, tell your doctor that you are using this medicine (On-Body Injector only). What side effects may I notice from receiving this medication? Side effects that you should report to your doctor or health care professional as soon as possible: allergic reactions (skin rash, itching or hives, swelling of the face, lips, or tongue) back pain dizziness fever pain,   redness, or irritation at site where injected pinpoint red spots on the skin red or dark-brown urine shortness of breath or breathing problems stomach or side pain, or pain at the shoulder swelling tiredness trouble passing urine or change in the amount of  urine unusual bruising or bleeding Side effects that usually do not require medical attention (report to your doctor or health care professional if they continue or are bothersome): bone pain muscle pain This list may not describe all possible side effects. Call your doctor for medical advice about side effects. You may report side effects to FDA at 1-800-FDA-1088. Where should I keep my medication? Keep out of the reach of children. If you are using this medicine at home, you will be instructed on how to store it. Throw away any unused medicine after the expiration date on the label. NOTE: This sheet is a summary. It may not cover all possible information. If you have questions about this medicine, talk to your doctor, pharmacist, or health care provider.  2022 [employer] (2020-10-29 11:54:14)  

## 2021-05-16 ENCOUNTER — Encounter: Payer: Self-pay | Admitting: Oncology

## 2021-05-16 NOTE — Telephone Encounter (Signed)
Please see attached pictures an advise.

## 2021-05-18 ENCOUNTER — Ambulatory Visit
Admission: RE | Admit: 2021-05-18 | Discharge: 2021-05-18 | Disposition: A | Discharge status: Home / Self Care | Payer: Medicare Other | Source: Ambulatory Visit | Attending: Oncology | Admitting: Oncology

## 2021-05-18 ENCOUNTER — Other Ambulatory Visit: Payer: Self-pay

## 2021-05-18 DIAGNOSIS — C799 Secondary malignant neoplasm of unspecified site: Secondary | ICD-10-CM | POA: Insufficient documentation

## 2021-05-18 DIAGNOSIS — R918 Other nonspecific abnormal finding of lung field: Secondary | ICD-10-CM | POA: Insufficient documentation

## 2021-05-18 DIAGNOSIS — N281 Cyst of kidney, acquired: Secondary | ICD-10-CM | POA: Diagnosis not present

## 2021-05-18 DIAGNOSIS — I2510 Atherosclerotic heart disease of native coronary artery without angina pectoris: Secondary | ICD-10-CM | POA: Diagnosis not present

## 2021-05-18 DIAGNOSIS — I700 Atherosclerosis of aorta: Secondary | ICD-10-CM | POA: Diagnosis not present

## 2021-05-18 DIAGNOSIS — C20 Malignant neoplasm of rectum: Secondary | ICD-10-CM | POA: Insufficient documentation

## 2021-05-18 DIAGNOSIS — Z5111 Encounter for antineoplastic chemotherapy: Secondary | ICD-10-CM | POA: Diagnosis not present

## 2021-05-18 DIAGNOSIS — Z933 Colostomy status: Secondary | ICD-10-CM | POA: Insufficient documentation

## 2021-05-18 DIAGNOSIS — J984 Other disorders of lung: Secondary | ICD-10-CM | POA: Diagnosis not present

## 2021-05-18 DIAGNOSIS — K5730 Diverticulosis of large intestine without perforation or abscess without bleeding: Secondary | ICD-10-CM | POA: Diagnosis not present

## 2021-05-18 MED ORDER — IOHEXOL 300 MG/ML  SOLN
100.0000 mL | Freq: Once | INTRAMUSCULAR | Status: AC | PRN
  Administered 2021-05-18: 100 mL via INTRAVENOUS

## 2021-05-23 ENCOUNTER — Telehealth: Payer: Self-pay | Admitting: Family Medicine

## 2021-05-23 NOTE — Telephone Encounter (Signed)
Left message for patient to call back and schedule Medicare Annual Wellness Visit (AWV) to be done virtually or by telephone.  No hx of AWV eligible as of 12/14/12  Please schedule at anytime with [facility].      40 Minutes appointment   Any questions, please call me at [phone]

## 2021-05-24 ENCOUNTER — Inpatient Hospital Stay (HOSPITAL_BASED_OUTPATIENT_CLINIC_OR_DEPARTMENT_OTHER): Payer: Medicare Other | Admitting: Oncology

## 2021-05-24 ENCOUNTER — Encounter: Payer: Self-pay | Admitting: Oncology

## 2021-05-24 ENCOUNTER — Inpatient Hospital Stay: Payer: Medicare Other | Attending: Oncology

## 2021-05-24 ENCOUNTER — Inpatient Hospital Stay: Payer: Medicare Other

## 2021-05-24 VITALS — BP 137/71 | HR 71 | Temp 98.3°F | Resp 18 | Wt 170.5 lb

## 2021-05-24 DIAGNOSIS — Z5189 Encounter for other specified aftercare: Secondary | ICD-10-CM | POA: Diagnosis not present

## 2021-05-24 DIAGNOSIS — Z86718 Personal history of other venous thrombosis and embolism: Secondary | ICD-10-CM | POA: Insufficient documentation

## 2021-05-24 DIAGNOSIS — E7800 Pure hypercholesterolemia, unspecified: Secondary | ICD-10-CM | POA: Diagnosis not present

## 2021-05-24 DIAGNOSIS — C20 Malignant neoplasm of rectum: Secondary | ICD-10-CM

## 2021-05-24 DIAGNOSIS — D701 Agranulocytosis secondary to cancer chemotherapy: Secondary | ICD-10-CM

## 2021-05-24 DIAGNOSIS — T451X5A Adverse effect of antineoplastic and immunosuppressive drugs, initial encounter: Secondary | ICD-10-CM | POA: Diagnosis not present

## 2021-05-24 DIAGNOSIS — Z5111 Encounter for antineoplastic chemotherapy: Secondary | ICD-10-CM

## 2021-05-24 DIAGNOSIS — Z86711 Personal history of pulmonary embolism: Secondary | ICD-10-CM | POA: Insufficient documentation

## 2021-05-24 DIAGNOSIS — R197 Diarrhea, unspecified: Secondary | ICD-10-CM | POA: Diagnosis not present

## 2021-05-24 DIAGNOSIS — K219 Gastro-esophageal reflux disease without esophagitis: Secondary | ICD-10-CM | POA: Diagnosis not present

## 2021-05-24 DIAGNOSIS — Z8042 Family history of malignant neoplasm of prostate: Secondary | ICD-10-CM | POA: Insufficient documentation

## 2021-05-24 DIAGNOSIS — R609 Edema, unspecified: Secondary | ICD-10-CM | POA: Diagnosis not present

## 2021-05-24 DIAGNOSIS — E876 Hypokalemia: Secondary | ICD-10-CM | POA: Insufficient documentation

## 2021-05-24 DIAGNOSIS — K521 Toxic gastroenteritis and colitis: Secondary | ICD-10-CM

## 2021-05-24 DIAGNOSIS — C799 Secondary malignant neoplasm of unspecified site: Secondary | ICD-10-CM

## 2021-05-24 DIAGNOSIS — I10 Essential (primary) hypertension: Secondary | ICD-10-CM | POA: Insufficient documentation

## 2021-05-24 DIAGNOSIS — Z79899 Other long term (current) drug therapy: Secondary | ICD-10-CM | POA: Diagnosis not present

## 2021-05-24 DIAGNOSIS — Z800 Family history of malignant neoplasm of digestive organs: Secondary | ICD-10-CM | POA: Insufficient documentation

## 2021-05-24 DIAGNOSIS — D709 Neutropenia, unspecified: Secondary | ICD-10-CM | POA: Insufficient documentation

## 2021-05-24 DIAGNOSIS — Z87891 Personal history of nicotine dependence: Secondary | ICD-10-CM | POA: Diagnosis not present

## 2021-05-24 DIAGNOSIS — Z95828 Presence of other vascular implants and grafts: Secondary | ICD-10-CM

## 2021-05-24 LAB — COMPREHENSIVE METABOLIC PANEL
ALT: 15 U/L (ref 0–44)
AST: 29 U/L (ref 15–41)
Albumin: 4.4 g/dL (ref 3.5–5.0)
Alkaline Phosphatase: 70 U/L (ref 38–126)
Anion gap: 8 (ref 5–15)
BUN: 10 mg/dL (ref 8–23)
CO2: 29 mmol/L (ref 22–32)
Calcium: 9.4 mg/dL (ref 8.9–10.3)
Chloride: 100 mmol/L (ref 98–111)
Creatinine, Ser: 1.04 mg/dL — ABNORMAL HIGH (ref 0.44–1.00)
GFR, Estimated: 56 mL/min — ABNORMAL LOW (ref >60)
Glucose, Bld: 101 mg/dL — ABNORMAL HIGH (ref 70–99)
Potassium: 3.4 mmol/L — ABNORMAL LOW (ref 3.5–5.1)
Sodium: 137 mmol/L (ref 135–145)
Total Bilirubin: 0.4 mg/dL (ref 0.3–1.2)
Total Protein: 7.2 g/dL (ref 6.5–8.1)

## 2021-05-24 LAB — CBC WITH DIFFERENTIAL/PLATELET
Abs Immature Granulocytes: 0.23 10*3/uL — ABNORMAL HIGH (ref 0.00–0.07)
Basophils Absolute: 0.1 10*3/uL (ref 0.0–0.1)
Basophils Relative: 1 %
Eosinophils Absolute: 0.2 10*3/uL (ref 0.0–0.5)
Eosinophils Relative: 2 %
HCT: 38.1 % (ref 36.0–46.0)
Hemoglobin: 12.2 g/dL (ref 12.0–15.0)
Immature Granulocytes: 4 %
Lymphocytes Relative: 27 %
Lymphs Abs: 1.7 10*3/uL (ref 0.7–4.0)
MCH: 31.1 pg (ref 26.0–34.0)
MCHC: 32 g/dL (ref 30.0–36.0)
MCV: 97.2 fL (ref 80.0–100.0)
Monocytes Absolute: 0.6 10*3/uL (ref 0.1–1.0)
Monocytes Relative: 10 %
Neutro Abs: 3.5 10*3/uL (ref 1.7–7.7)
Neutrophils Relative %: 56 %
Platelets: 223 10*3/uL (ref 150–400)
RBC: 3.92 MIL/uL (ref 3.87–5.11)
RDW: 17.1 % — ABNORMAL HIGH (ref 11.5–15.5)
WBC: 6.3 10*3/uL (ref 4.0–10.5)
nRBC: 0.5 % — ABNORMAL HIGH (ref 0.0–0.2)

## 2021-05-24 LAB — PROTEIN, URINE, RANDOM: Total Protein, Urine: 24 mg/dL

## 2021-05-24 MED ORDER — SODIUM CHLORIDE 0.9% FLUSH
10.0000 mL | Freq: Once | INTRAVENOUS | Status: AC
  Administered 2021-05-24: 10 mL via INTRAVENOUS
  Filled 2021-05-24: qty 10

## 2021-05-24 MED ORDER — SODIUM CHLORIDE 0.9 % IV SOLN
750.0000 mg | Freq: Once | INTRAVENOUS | Status: AC
  Administered 2021-05-24: 750 mg via INTRAVENOUS
  Filled 2021-05-24: qty 37.5

## 2021-05-24 MED ORDER — SODIUM CHLORIDE 0.9 % IV SOLN
2400.0000 mg/m2 | INTRAVENOUS | Status: DC
  Administered 2021-05-24: 4500 mg via INTRAVENOUS
  Filled 2021-05-24: qty 90

## 2021-05-24 MED ORDER — MONTELUKAST SODIUM 10 MG PO TABS
10.0000 mg | ORAL_TABLET | Freq: Once | ORAL | Status: AC
  Administered 2021-05-24: 10 mg via ORAL
  Filled 2021-05-24: qty 1

## 2021-05-24 MED ORDER — DIPHENHYDRAMINE HCL 50 MG/ML IJ SOLN
25.0000 mg | Freq: Once | INTRAMUSCULAR | Status: AC
  Administered 2021-05-24: 25 mg via INTRAVENOUS
  Filled 2021-05-24: qty 1

## 2021-05-24 MED ORDER — SODIUM CHLORIDE 0.9 % IV SOLN
10.0000 mg | Freq: Once | INTRAVENOUS | Status: AC
  Administered 2021-05-24: 10 mg via INTRAVENOUS
  Filled 2021-05-24: qty 10

## 2021-05-24 MED ORDER — SODIUM CHLORIDE 0.9 % IV SOLN
150.0000 mg/m2 | Freq: Once | INTRAVENOUS | Status: AC
  Administered 2021-05-24: 280 mg via INTRAVENOUS
  Filled 2021-05-24: qty 4

## 2021-05-24 MED ORDER — PALONOSETRON HCL INJECTION 0.25 MG/5ML
0.2500 mg | Freq: Once | INTRAVENOUS | Status: AC
  Administered 2021-05-24: 0.25 mg via INTRAVENOUS
  Filled 2021-05-24: qty 5

## 2021-05-24 MED ORDER — ATROPINE SULFATE 1 MG/ML IJ SOLN
0.5000 mg | Freq: Once | INTRAMUSCULAR | Status: AC
  Administered 2021-05-24: 0.5 mg via INTRAVENOUS
  Filled 2021-05-24: qty 1

## 2021-05-24 MED ORDER — SODIUM CHLORIDE 0.9 % IV SOLN
5.0000 mg/kg | Freq: Once | INTRAVENOUS | Status: AC
  Administered 2021-05-24: 400 mg via INTRAVENOUS
  Filled 2021-05-24: qty 16

## 2021-05-24 MED ORDER — FLUOROURACIL CHEMO INJECTION 2.5 GM/50ML
400.0000 mg/m2 | Freq: Once | INTRAVENOUS | Status: AC
  Administered 2021-05-24: 750 mg via INTRAVENOUS
  Filled 2021-05-24: qty 15

## 2021-05-24 MED ORDER — SODIUM CHLORIDE 0.9 % IV SOLN
Freq: Once | INTRAVENOUS | Status: AC
  Filled 2021-05-24: qty 250

## 2021-05-24 NOTE — Progress Notes (Signed)
Hematology/Oncology progress note [facility] [phone] [phone]   Patient Care Team: Olin Hauser, DO as PCP - General (Family Medicine) Clent Jacks, RN as Registered Nurse Earlie Server, MD as Consulting Physician (Hematology and Oncology)  CHIEF COMPLAINTS/REASON FOR VISIT:  Follow up for rectal cancer  HISTORY OF PRESENTING ILLNESS:  Patient initially presented with complaints of postmenopausal bleeding on 08/16/2018.  History of was menopausal vaginal bleeding in 2016 which resulted in cervical polypectomy.  Pathology 02/04/2015 showed cervical polyp, consistent with benign endometrial polyp.  [name] after polypectomy due to anxiety associated with pelvic exams.  pelvic exam on 08/16/2018 reviewed cervical abnormality and from enlarged uterus. Seen by Dr. Marcelline Mates on 10/29/2018.  Endometrial biopsy and a Pap smear was performed. 10/29/2018 Pap smear showed adenocarcinoma, favor endometrial origin. 10/29/2018 endometrial biopsy showed endometrioid carcinoma, FIGO grade 1.  10/29/2018- TA & TV Ultrasound revealed: Anteverted uterus measuring 8.7 x 5.6 x 6.4 cm without evidence of focal masses.  The endometrium measuring 24.1 mm (thickened) and heterogeneous.  Right and left ovaries not visualized.  No adnexal masses identified.  No free fluid in cul-de-sac.  Patient was seen by Dr. Theora Gianotti in clinic on 11/13/2018.  Cervical exam reveals 2 cm exophytic irregular mass consistent with malignancy.   11/19/2018 CT chest abdomen pelvis with contrast showed thickened endometrium with some irregularity compatible with the provided diagnosis of endometrial malignancy.  There is a mildly prominent left inguinal node 1.4 cm.  Patient was seen by Dr. Fransisca Connors on 11/20/2018 and left groin lymph node biopsy was recommended.  11/26/2018 patient underwent left inguinal lymph node biopsy. Pathology showed metastatic adenocarcinoma consistent  with colorectal origin.  CDX 2+.  Case was discussed on tumor board.  Recommend colonoscopy for further evaluation.  Patient reports significant weight loss 30 pounds over the last year.  Chronic vaginal spotting. Change of bowel habits the past few months.  More constipated.  Family history positive for brother who has colon cancer prostate cancer.  patient has underwent colonoscopy on 12/03/2018 which reviewed a nonobstructing large mass in the rectum.  Also chronic fistula.  Mass was not circumferential.  This was biopsied with a cold forceps for histology.  Pathology came back hyperplastic polyp negative for dysplasia and malignancy. [name], patient underwent flex sigmoidoscopy on 12/06/2018 with rebiopsy of the rectal mass. This time biopsy results came back positive for invasive colorectal adenocarcinoma, moderately differentiated. Immunotherapy for nearly mismatch repair protein (MMR ) was performed.  There is no loss of MMR expression.  low probability of MSI high.   # Seen by [name] for evaluation of resectability for rectal cancer. In addition, she also had a second opinion with [facility] pathology where her endometrial biopsy pathology was changed to  adenocarcinoma, consistent with colorectal primary.   [name]. She has home health that has been assisting with ostomy care  Patient was also evaluated by [facility] oncology.  Recommendation is to proceed with TNT with concurrent chemoradiation followed by neoadjuvant chemotherapy followed by surgical resection. Patient prefers to have treatment done locally with [facility].   # Oncology Treatment:  02/03/2019- 03/19/2019  concurrent Xeloda and radiation.  Xeloda dose 84m /m2 BID - rounded to 16551mBID- on days of radiation. 04/09/2019, started on FOLFOX with bolus 5-FU omitted.  07/16/2019 finished 8 cycles of FOLFOX. 09/17/19 APR/posterior vaginectomy/TAH/BSO/VY-flap pT4b pN0 with  close vaginal margin 0.2 mm.  Uterus and ovaries negative  for malignancy. Patient reports bilateral lower extremity numbness and tingling, intermittent, left worse than right. She has lost a lot of weight since her APR surgery.   #Family history with half brother having's history of colon cancer prostate cancer.  Personal history of colorectal cancer.  Patient has not decided if she wants genetic testing.    # history of PE( 01/13/2020)  in the bilateral lower extremity DVT (01/13/2020).   She finishes 6 months of anticoagulation with Eliquis 5 mg twice daily. Now switched to Eliquis 2.5 mg twice daily..  # She has now developed recurrent disease. #06/30/20  vaginal introitus mass biopsied. Pathology is consistent with metastatic colorectal adenocarcinoma I have discussed with [name]  Dr. Hester Mates and the mass is not resectable. Patient has also had colonoscopy by Dr. Vicente Males yesterday. Normal examination. # 07/16/2020 cycle 1 FOLFIRI  # 07/20/2020 PET scan was done for further evaluation, images are consistent with local recurrence, no distant metastasis. #Discussed with radiation oncology Dr. Baruch Gouty will recommends concurrent chemotherapy and radiation. 08/02/2020-08/16/2020, patient starts radiation.  Xeloda was held due to neutropenia 08/17/2020,-09/06/2020 Xeloda 1500 mg twice daily concurrently with radiation  01/31/21 started on FOLFIRI + Bev   INTERVAL HISTORY PHOENIX RIESEN is a 75 y.o. female who has above history reviewed by me presents for follow-up of rectal cancer. She tolerates chemotherapy well.  She denies  Weight is stable.  Denies any abdominal pain, nausea or vomiting.  Lost 3 pounds since last visit.    Review of Systems  Constitutional:  Negative for appetite change, chills, fatigue, fever and unexpected weight change.  HENT:   Negative for hearing loss and voice change.   Eyes:  Negative for eye problems.  Respiratory:  Negative for chest tightness and cough.    Cardiovascular:  Negative for chest pain.  Gastrointestinal:  Positive for diarrhea. Negative for abdominal distention, abdominal pain, blood in stool, constipation and nausea.  Endocrine: Negative for hot flashes.  Genitourinary:  Negative for difficulty urinating and frequency.   Musculoskeletal:  Negative for arthralgias.  Skin:  Negative for itching and rash.  Neurological:  Negative for extremity weakness and numbness.  Hematological:  Negative for adenopathy.  Psychiatric/Behavioral:  Negative for confusion.    MEDICAL HISTORY:  Past Medical History:  Diagnosis Date   Allergy    Arthritis    Blood clot in vein    Family history of colon cancer    GERD (gastroesophageal reflux disease)    Hypercholesteremia    Hypertension    Hypertension    Lower extremity edema    Rectal cancer ([place]) 12/2018   Urinary incontinence     SURGICAL HISTORY: Past Surgical History:  Procedure Laterality Date   ABDOMINAL HYSTERECTOMY     CHOLECYSTECTOMY  1971   COLONOSCOPY WITH PROPOFOL N/A 12/03/2018   Procedure: COLONOSCOPY WITH PROPOFOL;  Surgeon: Lucilla Lame, MD;  Location: [facility] ENDOSCOPY;  Service: Endoscopy;  Laterality: N/A;   COLONOSCOPY WITH PROPOFOL N/A 07/15/2020   Procedure: COLONOSCOPY WITH PROPOFOL;  Surgeon: [name], MD;  Location: [facility] ENDOSCOPY;  Service: Gastroenterology;  Laterality: N/A;   FLEXIBLE SIGMOIDOSCOPY N/A 12/06/2018   Procedure: FLEXIBLE SIGMOIDOSCOPY;  Surgeon: [name], MD;  Location: [facility] ENDOSCOPY;  Service: Endoscopy;  Laterality: N/A;   LAPAROSCOPIC COLOSTOMY  01/06/2019   PORTACATH PLACEMENT N/A 04/03/2019   Procedure: INSERTION PORT-A-CATH;  Surgeon: Jules Husbands, MD;  Location: [facility] ORS;  Service: General;  Laterality: N/A;    SOCIAL HISTORY: Social History   Socioeconomic  History   Marital status: Widowed    Spouse name: Not on file   Number of children: Not on file   Years of education: Not on file   Highest education level: Not on  file  Occupational History   Not on file  Tobacco Use   Smoking status: Former    Types: Cigarettes    Quit date: 12/02/1977    Years since quitting: 43.5   Smokeless tobacco: Former  Scientific laboratory technician Use: Never used  Substance and Sexual Activity   Alcohol use: Never   Drug use: Never   Sexual activity: Not Currently    Birth control/protection: None  Other Topics Concern   Not on file  Social History Narrative   Lives with daughter   Social Determinants of Health   Financial Resource Strain: Not on file  Food Insecurity: Not on file  Transportation Needs: Not on file  Physical Activity: Not on file  Stress: Not on file  Social Connections: Not on file  Intimate Partner Violence: Not on file    FAMILY HISTORY: Family History  Problem Relation Age of Onset   Colon cancer Brother 64       exposure to chemicals [place]   Hypertension Mother    Stroke Mother    Kidney failure Father    Breast cancer Neg Hx    Ovarian cancer Neg Hx     ALLERGIES:  is allergic to sulfamethoxazole-trimethoprim.  MEDICATIONS:  Current Outpatient Medications  Medication Sig Dispense Refill   apixaban (ELIQUIS) 2.5 MG TABS tablet Take 1 tablet (2.5 mg total) by mouth 2 (two) times daily. 180 tablet 1   Cholecalciferol (VITAMIN D3) 2000 units capsule Take 2,000 Units by mouth daily.     diclofenac sodium (VOLTAREN) 1 % GEL Apply 2 g topically 4 (four) times daily as needed (joint pain).  11   fluticasone (FLONASE) 50 MCG/ACT nasal spray Place 1 spray into both nostrils daily. 16 g 1   gabapentin (NEURONTIN) 100 MG capsule Take 1 capsule (100 mg total) by mouth at bedtime. 30 capsule 0   ipratropium (ATROVENT) 0.03 % nasal spray Place 1 spray into both nostrils 2 (two) times daily. 30 mL 2   lidocaine-prilocaine (EMLA) cream Apply 1 application topically as needed. 30 g 6   loperamide (IMODIUM) 2 MG capsule Take 1 capsule (2 mg total) by mouth See admin instructions. With onset of loose  stool, take 26m followed by 2101mevery 2 hours,  Maximum: 16 mg/day 120 capsule 1   loratadine (CLARITIN) 10 MG tablet Take 10 mg by mouth daily.     Multiple Vitamins-Minerals (ONE-A-DAY WOMENS 50 PLUS PO) Take 1 tablet by mouth daily.      ondansetron (ZOFRAN) 8 MG tablet Take 1 tablet (8 mg total) by mouth 2 (two) times daily as needed for refractory nausea / vomiting. Start on day 3 after chemotherapy. 30 tablet 1   potassium chloride SA (KLOR-CON) 20 MEQ tablet Take 1 tablet (20 mEq total) by mouth daily. 90 tablet 3   prochlorperazine (COMPAZINE) 10 MG tablet Take 1 tablet (10 mg total) by mouth every 6 (six) hours as needed (NAUSEA). 30 tablet 1   simvastatin (ZOCOR) 40 MG tablet Take 1 tablet (40 mg total) by mouth at bedtime. 90 tablet 3   triamterene-hydrochlorothiazide (DYAZIDE) 37.5-25 MG capsule Take 1 each (1 capsule total) by mouth daily. 90 capsule 3   zinc gluconate 50 MG tablet Take 50 mg by mouth daily.  No current facility-administered medications for this visit.   Facility-Administered Medications Ordered in Other Visits  Medication Dose Route Frequency Provider Last Rate Last Admin   fluorouracil (ADRUCIL) 4,500 mg in sodium chloride 0.9 % 60 mL chemo infusion  2,400 mg/m2 (Treatment Plan Recorded) Intravenous 1 day or 1 dose Earlie Server, MD   4,500 mg at 05/24/21 1255     PHYSICAL EXAMINATION: ECOG PERFORMANCE STATUS: 1 - Symptomatic but completely ambulatory  Physical Exam Constitutional:      General: She is not in acute distress. HENT:     Head: Normocephalic and atraumatic.  Eyes:     General: No scleral icterus. Cardiovascular:     Rate and Rhythm: Normal rate and regular rhythm.     Heart sounds: Normal heart sounds.  Pulmonary:     Effort: Pulmonary effort is normal. No respiratory distress.     Breath sounds: No wheezing.  Abdominal:     General: Bowel sounds are normal. There is no distension.     Palpations: Abdomen is soft.     Comments: +  Colostomy bag   Musculoskeletal:        General: No deformity. Normal range of motion.     Cervical back: Normal range of motion and neck supple.  Skin:    General: Skin is warm and dry.     Findings: No erythema or rash.  Neurological:     Mental Status: She is alert and oriented to person, place, and time. Mental status is at baseline.     Cranial Nerves: No cranial nerve deficit.     Coordination: Coordination normal.   CMP Latest Ref Rng & Units 05/24/2021  Glucose 70 - 99 mg/dL 101(H)  BUN 8 - 23 mg/dL 10  Creatinine 0.44 - 1.00 mg/dL 1.04(H)  Sodium 135 - 145 mmol/L 137  Potassium 3.5 - 5.1 mmol/L 3.4(L)  Chloride 98 - 111 mmol/L 100  CO2 22 - 32 mmol/L 29  Calcium 8.9 - 10.3 mg/dL 9.4  Total Protein 6.5 - 8.1 g/dL 7.2  Total Bilirubin 0.3 - 1.2 mg/dL 0.4  Alkaline Phos 38 - 126 U/L 70  AST 15 - 41 U/L 29  ALT 0 - 44 U/L 15   CBC Latest Ref Rng & Units 05/24/2021  WBC 4.0 - 10.5 K/uL 6.3  Hemoglobin 12.0 - 15.0 g/dL 12.2  Hematocrit 36.0 - 46.0 % 38.1  Platelets 150 - 400 K/uL 223    LABORATORY DATA:  I have reviewed the data as listed Lab Results  Component Value Date   WBC 6.3 05/24/2021   HGB 12.2 05/24/2021   HCT 38.1 05/24/2021   MCV 97.2 05/24/2021   PLT 223 05/24/2021   Recent Labs    06/23/20 1124 07/16/20 0802 07/23/20 1345 04/26/21 0829 05/10/21 0805 05/24/21 0800  NA 140 140   < > 137 137 137  K 3.7 3.2*   < > 3.5 3.3* 3.4*  CL 100 101   < > 101 101 100  CO2 30 29   < > '26 26 29  ' GLUCOSE 114* 114*   < > 100* 102* 101*  BUN 10 9   < > '8 9 10  ' CREATININE 0.97 0.87   < > 0.92 1.02* 1.04*  CALCIUM 9.1 9.2   < > 9.3 9.3 9.4  GFRNONAA 58* >60   < > >60 58* 56*  GFRAA >60 >60  --   --   --   --   PROT 7.0 6.9   < >  7.0 6.9 7.2  ALBUMIN 4.0 3.6   < > 4.1 4.2 4.4  AST 25 24   < > '29 30 29  ' ALT 15 14   < > '18 16 15  ' ALKPHOS 41 41   < > 74 68 70  BILITOT 0.4 0.5   < > 0.5 0.4 0.4   < > = values in this interval not displayed.     Iron/TIBC/Ferritin/ %Sat    Component Value Date/Time   IRON 40 06/23/2020 1124   TIBC 246 (L) 06/23/2020 1124   FERRITIN 338 (H) 06/23/2020 1124   IRONPCTSAT 16 06/23/2020 1124     RADIOGRAPHIC STUDIES: I have personally reviewed the radiological images as listed and agreed with the findings in the report. CT CHEST ABDOMEN PELVIS W CONTRAST  Result Date: 05/19/2021 CLINICAL DATA:  Rectal cancer restaging, vaginal recurrence, ongoing chemotherapy EXAM: CT CHEST, ABDOMEN, AND PELVIS WITH CONTRAST TECHNIQUE: Multidetector CT imaging of the chest, abdomen and pelvis was performed following the standard protocol during bolus administration of intravenous contrast. CONTRAST:  130m OMNIPAQUE IOHEXOL 300 MG/ML SOLN, additional oral enteric contrast COMPARISON:  01/19/2021 FINDINGS: CT CHEST FINDINGS Cardiovascular: Right chest port catheter. Aortic atherosclerosis. Normal heart size. Scattered left and right coronary artery calcifications enlargement of the main pulmonary artery measuring up to 3.7 cm in caliber. No pericardial effusion. Mediastinum/Nodes: No enlarged mediastinal, hilar, or axillary lymph nodes. Thyroid gland, trachea, and esophagus demonstrate no significant findings. Lungs/Pleura: Minimal, bandlike scarring of the bilateral lung bases. Interval decrease in size of multiple small bilateral pulmonary nodules, for example a 5 mm nodule of the medial segment right middle lobe, previously 8 mm (series 4, image 97) and a 2 mm nodule of the posterior right upper lobe, previously 4 mm (series 4, image 48). No pleural effusion or pneumothorax. Musculoskeletal: No chest wall mass or suspicious bone lesions identified. CT ABDOMEN PELVIS FINDINGS Hepatobiliary: No focal liver abnormality is seen. Status post cholecystectomy. Postoperative biliary dilatation. Pancreas: Unremarkable. No pancreatic ductal dilatation or surrounding inflammatory changes. Spleen: Normal in size without significant  abnormality. Adrenals/Urinary Tract: Adrenal glands are unremarkable. Multiple bilateral renal cysts. Kidneys are otherwise normal, without renal calculi, solid lesion, or hydronephrosis. Bladder is unremarkable. Stomach/Bowel: Stomach is within normal limits. Appendix appears normal. No evidence of bowel wall thickening, distention, or inflammatory changes. Redemonstrated postoperative findings of abdominoperineal resection with left lower quadrant end colostomy. Diverticulosis of the remnant sigmoid. Vascular/Lymphatic: Aortic atherosclerosis. Previously noted enlargement of bilateral inguinal lymph nodes is resolved, left inguinal node measuring 1.5 x 0.8 cm, previously 2.5 x 1.4 cm (series 2, image 105). Reproductive: Status post hysterectomy. Some appearance of vaginal soft tissue thickening, in keeping with report of vaginal recurrence (series 2, image 114). Other: No abdominal wall hernia or abnormality. No abdominopelvic ascites. Musculoskeletal: No acute or significant osseous findings. IMPRESSION: 1. Redemonstrated postoperative findings of abdominoperineal resection with left lower quadrant end colostomy. 2. Some appearance of vaginal soft tissue thickening, in keeping with report of vaginal recurrence of rectal malignancy. This would be better assessed by MRI and physical examination. 3. Previously noted enlargement of bilateral inguinal lymph nodes is resolved, consistent with treatment response of nodal metastatic disease. 4. Interval decrease in size of multiple small bilateral pulmonary nodules, consistent with treatment response of pulmonary metastatic disease. 5. No evidence of new metastatic disease in the chest, abdomen, or pelvis. 6. Coronary artery disease. Aortic Atherosclerosis (ICD10-I70.0). Electronically Signed   By: AEddie CandleM.D.   On: 05/19/2021 10:40  CT CHEST ABDOMEN PELVIS W CONTRAST  Result Date: 05/19/2021 CLINICAL DATA:  Rectal cancer restaging, vaginal recurrence,  ongoing chemotherapy EXAM: CT CHEST, ABDOMEN, AND PELVIS WITH CONTRAST TECHNIQUE: Multidetector CT imaging of the chest, abdomen and pelvis was performed following the standard protocol during bolus administration of intravenous contrast. CONTRAST:  148m OMNIPAQUE IOHEXOL 300 MG/ML SOLN, additional oral enteric contrast COMPARISON:  01/19/2021 FINDINGS: CT CHEST FINDINGS Cardiovascular: Right chest port catheter. Aortic atherosclerosis. Normal heart size. Scattered left and right coronary artery calcifications enlargement of the main pulmonary artery measuring up to 3.7 cm in caliber. No pericardial effusion. Mediastinum/Nodes: No enlarged mediastinal, hilar, or axillary lymph nodes. Thyroid gland, trachea, and esophagus demonstrate no significant findings. Lungs/Pleura: Minimal, bandlike scarring of the bilateral lung bases. Interval decrease in size of multiple small bilateral pulmonary nodules, for example a 5 mm nodule of the medial segment right middle lobe, previously 8 mm (series 4, image 97) and a 2 mm nodule of the posterior right upper lobe, previously 4 mm (series 4, image 48). No pleural effusion or pneumothorax. Musculoskeletal: No chest wall mass or suspicious bone lesions identified. CT ABDOMEN PELVIS FINDINGS Hepatobiliary: No focal liver abnormality is seen. Status post cholecystectomy. Postoperative biliary dilatation. Pancreas: Unremarkable. No pancreatic ductal dilatation or surrounding inflammatory changes. Spleen: Normal in size without significant abnormality. Adrenals/Urinary Tract: Adrenal glands are unremarkable. Multiple bilateral renal cysts. Kidneys are otherwise normal, without renal calculi, solid lesion, or hydronephrosis. Bladder is unremarkable. Stomach/Bowel: Stomach is within normal limits. Appendix appears normal. No evidence of bowel wall thickening, distention, or inflammatory changes. Redemonstrated postoperative findings of abdominoperineal resection with left lower quadrant  end colostomy. Diverticulosis of the remnant sigmoid. Vascular/Lymphatic: Aortic atherosclerosis. Previously noted enlargement of bilateral inguinal lymph nodes is resolved, left inguinal node measuring 1.5 x 0.8 cm, previously 2.5 x 1.4 cm (series 2, image 105). Reproductive: Status post hysterectomy. Some appearance of vaginal soft tissue thickening, in keeping with report of vaginal recurrence (series 2, image 114). Other: No abdominal wall hernia or abnormality. No abdominopelvic ascites. Musculoskeletal: No acute or significant osseous findings. IMPRESSION: 1. Redemonstrated postoperative findings of abdominoperineal resection with left lower quadrant end colostomy. 2. Some appearance of vaginal soft tissue thickening, in keeping with report of vaginal recurrence of rectal malignancy. This would be better assessed by MRI and physical examination. 3. Previously noted enlargement of bilateral inguinal lymph nodes is resolved, consistent with treatment response of nodal metastatic disease. 4. Interval decrease in size of multiple small bilateral pulmonary nodules, consistent with treatment response of pulmonary metastatic disease. 5. No evidence of new metastatic disease in the chest, abdomen, or pelvis. 6. Coronary artery disease. Aortic Atherosclerosis (ICD10-I70.0). Electronically Signed   By: AEddie CandleM.D.   On: 05/19/2021 10:40     ASSESSMENT & PLAN:  1. [place]   2. Encounter for antineoplastic chemotherapy   3. Chemotherapy induced neutropenia ([facility])   4. History of pulmonary embolism   5. Chemotherapy induced diarrhea   Cancer Staging Rectal cancer ([facility] Staging form: Colon and Rectum, AJCC 8th Edition - Clinical stage from 01/23/2019: Stage IIIC (cT4b, cN1a, cM0) - Signed by YEarlie Server MD on 01/23/2019 - Pathologic stage from 10/06/2019: Stage IIC (ypT4b, pN0, cM0) - Signed by YEarlie Server MD on 10/06/2019 - Pathologic: Stage Unknown (rpTX, pNX, cM1) - Signed by YEarlie Server MD on  01/31/2021  #History of stage IIIC Rectal cancer, s/p TNT, followed by 09/17/19 APR/posterior vaginectomy/TAH/BSO/VY-flap, pT4b pN0 with close vaginal margin 0.2 mm.  Uterus and ovaries negative for malignancy. palliative radiation to vaginal recurrence.  Finished 09/04/2020 01/19/21 recurrence with lung metastasis.-Palliative chemotherapy CEA has decreased.  -Neulasta on Day 3, Claritin for 4 days. CT images were reviewed and discussed with patient. She has had good treatment response  Labs are reviewed and discussed with patient. Proceed with FOLFIRI + bevacizumab today.   # Post treatment hives, likely due to chemotherapy. She gets additional one dose of IV benadryl 110m x 1. Recommend her to take oral benadryl as instructed PRN hive at home.   #Chemotherapy-induced neutropenia, see above.  On GCSF  #Chemotherapy-induced diarrhea, symptoms are stable.  As needed antidiarrhea medications..Marland Kitchen #Hypokalemia, potassium 3.4, continue potassium chloride 20 mEq daily.    #History of pulmonary embolism, history of bilateral DVT continue Eliquis 2.5 mg twice daily.   Follow-up 2 weeks for next cycle of treatment.  ZEarlie Server MD, PhD

## 2021-05-24 NOTE — Patient Instructions (Signed)
[facility] ONCOLOGY  Discharge Instructions: Thank you for choosing [facility] to provide your oncology and hematology care.  If you have a lab appointment with the [facility], please go directly to the [facility] and check in at the registration area.  Wear comfortable clothing and clothing appropriate for easy access to any Portacath or PICC line.   We strive to give you quality time with your provider. You may need to reschedule your appointment if you arrive late (15 or more minutes).  Arriving late affects you and other patients whose appointments are after yours.  Also, if you miss three or more appointments without notifying the office, you may be dismissed from the clinic at the provider's discretion.      For prescription refill requests, have your pharmacy contact our office and allow 72 hours for refills to be completed.    Today you received the following chemotherapy and/or immunotherapy agents Avastin, Irinotecan, Leucovorin, & 5FU.      To help prevent nausea and vomiting after your treatment, we encourage you to take your nausea medication as directed.  BELOW ARE SYMPTOMS THAT SHOULD BE REPORTED IMMEDIATELY: *FEVER GREATER THAN 100.4 F (38 C) OR HIGHER *CHILLS OR SWEATING *NAUSEA AND VOMITING THAT IS NOT CONTROLLED WITH YOUR NAUSEA MEDICATION *UNUSUAL SHORTNESS OF BREATH *UNUSUAL BRUISING OR BLEEDING *URINARY PROBLEMS (pain or burning when urinating, or frequent urination) *BOWEL PROBLEMS (unusual diarrhea, constipation, pain near the anus) TENDERNESS IN MOUTH AND THROAT WITH OR WITHOUT PRESENCE OF ULCERS (sore throat, sores in mouth, or a toothache) UNUSUAL RASH, SWELLING OR PAIN  UNUSUAL VAGINAL DISCHARGE OR ITCHING   Items with * indicate a potential emergency and should be followed up as soon as possible or go to the Emergency Department if any problems should occur.  Please show the CHEMOTHERAPY ALERT CARD or  IMMUNOTHERAPY ALERT CARD at check-in to the Emergency Department and triage nurse.  Should you have questions after your visit or need to cancel or reschedule your appointment, please contact [facility]  [phone] and follow the prompts.  Office hours are 8:00 a.m. to 4:30 p.m. Monday - Friday. Please note that voicemails left after 4:00 p.m. may not be returned until the following business day.  We are closed weekends and major holidays. You have access to a nurse at all times for urgent questions. Please call the main number to the clinic [phone] and follow the prompts.  For any non-urgent questions, you may also contact your provider using MyChart. We now offer e-Visits for anyone 75 and older to request care online for non-urgent symptoms. For details visit [email or web address].   Also download the MyChart app! Go to the app store, search "MyChart", open the app, select [facility], and log in with your MyChart username and password.  Due to Covid, a mask is required upon entering the hospital/clinic. If you do not have a mask, one will be given to you upon arrival. For doctor visits, patients may have 1 support person aged 77 or older with them. For treatment visits, patients cannot have anyone with them due to current Covid guidelines and our immunocompromised population.   The chemotherapy medication bag should finish at 46 hours, 96 hours, or 7 days. For example, if your pump is scheduled for 46 hours and it was put on at 4:00 p.m., it should finish at 2:00 p.m. the day it is scheduled to come off regardless of your appointment time.  If the display on your pump reads "Low Volume" and it is beeping, take the batteries out of the pump and come to the cancer center for it to be taken off.   If the pump alarms go off prior to the pump reading "Low Volume" then call [phone] and someone can assist you.  If the plunger comes out and the  chemotherapy medication is leaking out, please use your home chemo spill kit to clean up the spill. Do NOT use paper towels or other household products.  If you have problems or questions regarding your pump, please call either [phone] (24 hours a day) or the cancer center Monday-Friday 8:00 a.m.- 4:30 p.m. at the clinic number and we will assist you. If you are unable to get assistance, then go to the nearest Emergency Department and ask the staff to contact the IV team for assistance.

## 2021-05-24 NOTE — Progress Notes (Signed)
Post Irinotecan & Leucovorin infusion, patient developed mild hives across chest. This has been a typical reaction from patient with each infusion and Dr. Tasia Catchings has been made aware of these episodes. Patient denies any other s/s at this time. Benadryl 25 MG given as ordered. Patient educated on s/s as to when to seek emergency treatment. Patient verbalizes understanding and denies any further questions or concerns.

## 2021-05-24 NOTE — Progress Notes (Signed)
Pt here for follow up. No new concerns voiced.   

## 2021-05-25 ENCOUNTER — Inpatient Hospital Stay (HOSPITAL_BASED_OUTPATIENT_CLINIC_OR_DEPARTMENT_OTHER): Payer: Medicare Other | Admitting: Hospice and Palliative Medicine

## 2021-05-25 DIAGNOSIS — C799 Secondary malignant neoplasm of unspecified site: Secondary | ICD-10-CM | POA: Diagnosis not present

## 2021-05-25 LAB — CEA: CEA: 701 ng/mL — ABNORMAL HIGH (ref 0.0–4.7)

## 2021-05-25 NOTE — Progress Notes (Signed)
Virtual Visit via Telephone Note  I connected with Jacqueline Yoder on 05/25/21 at  2:15 PM EDT by telephone and verified that I am speaking with the correct person using two identifiers.  Location: Patient: Home Provider: Clinic   I discussed the limitations, risks, security and privacy concerns of performing an evaluation and management service by telephone and the availability of in person appointments. I also discussed with the patient that there may be a patient responsible charge related to this service. The patient expressed understanding and agreed to proceed.   History of Present Illness: Jacqueline Yoder is a 75 y.o. female with multiple medical problems including stage IV colorectal cancer initially diagnosed 10/29/2018 stage IIIC disease.  Patient underwent APR/posteriorvaginectomy/TAH/BSO/VY-flap and chemoradiation.  She developed local recurrence 07/20/2020 and received additional chemoradiation.  CT of the chest, abdomen, and pelvis on 01/19/2021 revealed multiple scattered new pulmonary nodules consistent with disease progression.  Palliative care was consulted to progress goals and manage ongoing symptoms.   Observations/Objective: I called and spoke with patient by phone.    She reports that she is doing reasonably well.  She denies significant changes or concerns.  No symptomatic complaints at present.  We spoke at length regarding the recent loss of her husband and her emotional coping with his passing.  She feels like she is appropriately grieving.  Emotional support provided.  Assessment and Plan: Stage IV colorectal cancer -seems to be tolerating treatment reasonably well. Will continue to follow.   Grief -normal grief response.  Discussed supportive services available if needed.  Follow Up Instructions: Follow-up MyChart 3 to 4 months   I discussed the assessment and treatment plan with the patient. The patient was provided an opportunity to ask questions and all were  answered. The patient agreed with the plan and demonstrated an understanding of the instructions.   The patient was advised to call back or seek an in-person evaluation if the symptoms worsen or if the condition fails to improve as anticipated.  I provided 10 minutes of non-face-to-face time during this encounter.   Irean Hong, NP

## 2021-05-26 ENCOUNTER — Inpatient Hospital Stay: Payer: Medicare Other

## 2021-05-26 VITALS — BP 144/62 | HR 66 | Temp 96.2°F | Resp 16

## 2021-05-26 DIAGNOSIS — E876 Hypokalemia: Secondary | ICD-10-CM | POA: Diagnosis not present

## 2021-05-26 DIAGNOSIS — Z79899 Other long term (current) drug therapy: Secondary | ICD-10-CM | POA: Diagnosis not present

## 2021-05-26 DIAGNOSIS — Z87891 Personal history of nicotine dependence: Secondary | ICD-10-CM | POA: Diagnosis not present

## 2021-05-26 DIAGNOSIS — I10 Essential (primary) hypertension: Secondary | ICD-10-CM | POA: Diagnosis not present

## 2021-05-26 DIAGNOSIS — R609 Edema, unspecified: Secondary | ICD-10-CM | POA: Diagnosis not present

## 2021-05-26 DIAGNOSIS — Z5111 Encounter for antineoplastic chemotherapy: Secondary | ICD-10-CM | POA: Diagnosis not present

## 2021-05-26 DIAGNOSIS — K219 Gastro-esophageal reflux disease without esophagitis: Secondary | ICD-10-CM | POA: Diagnosis not present

## 2021-05-26 DIAGNOSIS — E7800 Pure hypercholesterolemia, unspecified: Secondary | ICD-10-CM | POA: Diagnosis not present

## 2021-05-26 DIAGNOSIS — Z86711 Personal history of pulmonary embolism: Secondary | ICD-10-CM | POA: Diagnosis not present

## 2021-05-26 DIAGNOSIS — C20 Malignant neoplasm of rectum: Secondary | ICD-10-CM | POA: Diagnosis not present

## 2021-05-26 DIAGNOSIS — C799 Secondary malignant neoplasm of unspecified site: Secondary | ICD-10-CM

## 2021-05-26 DIAGNOSIS — Z5189 Encounter for other specified aftercare: Secondary | ICD-10-CM | POA: Diagnosis not present

## 2021-05-26 DIAGNOSIS — R197 Diarrhea, unspecified: Secondary | ICD-10-CM | POA: Diagnosis not present

## 2021-05-26 DIAGNOSIS — D709 Neutropenia, unspecified: Secondary | ICD-10-CM | POA: Diagnosis not present

## 2021-05-26 DIAGNOSIS — Z86718 Personal history of other venous thrombosis and embolism: Secondary | ICD-10-CM | POA: Diagnosis not present

## 2021-05-26 DIAGNOSIS — Z800 Family history of malignant neoplasm of digestive organs: Secondary | ICD-10-CM | POA: Diagnosis not present

## 2021-05-26 MED ORDER — PEGFILGRASTIM 6 MG/0.6ML ~~LOC~~ PSKT
6.0000 mg | PREFILLED_SYRINGE | Freq: Once | SUBCUTANEOUS | Status: AC
  Administered 2021-05-26: 6 mg via SUBCUTANEOUS
  Filled 2021-05-26: qty 0.6

## 2021-05-26 MED ORDER — SODIUM CHLORIDE 0.9% FLUSH
10.0000 mL | INTRAVENOUS | Status: DC | PRN
  Administered 2021-05-26: 10 mL
  Filled 2021-05-26: qty 10

## 2021-05-26 MED ORDER — HEPARIN SOD (PORK) LOCK FLUSH 100 UNIT/ML IV SOLN
500.0000 [IU] | Freq: Once | INTRAVENOUS | Status: AC | PRN
  Administered 2021-05-26: 500 [IU]
  Filled 2021-05-26: qty 5

## 2021-05-30 DIAGNOSIS — Z933 Colostomy status: Secondary | ICD-10-CM | POA: Diagnosis not present

## 2021-06-02 DIAGNOSIS — C799 Secondary malignant neoplasm of unspecified site: Secondary | ICD-10-CM | POA: Diagnosis not present

## 2021-06-02 DIAGNOSIS — C20 Malignant neoplasm of rectum: Secondary | ICD-10-CM | POA: Diagnosis not present

## 2021-06-06 DIAGNOSIS — Z933 Colostomy status: Secondary | ICD-10-CM | POA: Diagnosis not present

## 2021-06-07 ENCOUNTER — Encounter: Payer: Self-pay | Admitting: Oncology

## 2021-06-07 ENCOUNTER — Inpatient Hospital Stay: Payer: Medicare Other

## 2021-06-07 ENCOUNTER — Inpatient Hospital Stay (HOSPITAL_BASED_OUTPATIENT_CLINIC_OR_DEPARTMENT_OTHER): Payer: Medicare Other | Admitting: Oncology

## 2021-06-07 VITALS — BP 114/72 | HR 72 | Resp 17

## 2021-06-07 VITALS — BP 133/69 | HR 69 | Temp 97.9°F | Resp 16 | Ht 65.0 in | Wt 170.0 lb

## 2021-06-07 DIAGNOSIS — Z86718 Personal history of other venous thrombosis and embolism: Secondary | ICD-10-CM | POA: Diagnosis not present

## 2021-06-07 DIAGNOSIS — Z5189 Encounter for other specified aftercare: Secondary | ICD-10-CM | POA: Diagnosis not present

## 2021-06-07 DIAGNOSIS — Z86711 Personal history of pulmonary embolism: Secondary | ICD-10-CM

## 2021-06-07 DIAGNOSIS — C20 Malignant neoplasm of rectum: Secondary | ICD-10-CM

## 2021-06-07 DIAGNOSIS — Z5111 Encounter for antineoplastic chemotherapy: Secondary | ICD-10-CM

## 2021-06-07 DIAGNOSIS — L509 Urticaria, unspecified: Secondary | ICD-10-CM | POA: Diagnosis not present

## 2021-06-07 DIAGNOSIS — T451X5A Adverse effect of antineoplastic and immunosuppressive drugs, initial encounter: Secondary | ICD-10-CM

## 2021-06-07 DIAGNOSIS — I10 Essential (primary) hypertension: Secondary | ICD-10-CM | POA: Diagnosis not present

## 2021-06-07 DIAGNOSIS — K521 Toxic gastroenteritis and colitis: Secondary | ICD-10-CM

## 2021-06-07 DIAGNOSIS — R197 Diarrhea, unspecified: Secondary | ICD-10-CM | POA: Diagnosis not present

## 2021-06-07 DIAGNOSIS — E876 Hypokalemia: Secondary | ICD-10-CM | POA: Diagnosis not present

## 2021-06-07 DIAGNOSIS — Z800 Family history of malignant neoplasm of digestive organs: Secondary | ICD-10-CM | POA: Diagnosis not present

## 2021-06-07 DIAGNOSIS — D709 Neutropenia, unspecified: Secondary | ICD-10-CM | POA: Diagnosis not present

## 2021-06-07 DIAGNOSIS — C799 Secondary malignant neoplasm of unspecified site: Secondary | ICD-10-CM | POA: Diagnosis not present

## 2021-06-07 DIAGNOSIS — D701 Agranulocytosis secondary to cancer chemotherapy: Secondary | ICD-10-CM

## 2021-06-07 DIAGNOSIS — Z79899 Other long term (current) drug therapy: Secondary | ICD-10-CM | POA: Diagnosis not present

## 2021-06-07 DIAGNOSIS — Z87891 Personal history of nicotine dependence: Secondary | ICD-10-CM | POA: Diagnosis not present

## 2021-06-07 DIAGNOSIS — E7800 Pure hypercholesterolemia, unspecified: Secondary | ICD-10-CM | POA: Diagnosis not present

## 2021-06-07 DIAGNOSIS — K219 Gastro-esophageal reflux disease without esophagitis: Secondary | ICD-10-CM | POA: Diagnosis not present

## 2021-06-07 DIAGNOSIS — R609 Edema, unspecified: Secondary | ICD-10-CM | POA: Diagnosis not present

## 2021-06-07 LAB — COMPREHENSIVE METABOLIC PANEL
ALT: 20 U/L (ref 0–44)
AST: 28 U/L (ref 15–41)
Albumin: 4.1 g/dL (ref 3.5–5.0)
Alkaline Phosphatase: 79 U/L (ref 38–126)
Anion gap: 9 (ref 5–15)
BUN: 8 mg/dL (ref 8–23)
CO2: 27 mmol/L (ref 22–32)
Calcium: 9.3 mg/dL (ref 8.9–10.3)
Chloride: 99 mmol/L (ref 98–111)
Creatinine, Ser: 0.97 mg/dL (ref 0.44–1.00)
GFR, Estimated: >60 mL/min (ref >60)
Glucose, Bld: 101 mg/dL — ABNORMAL HIGH (ref 70–99)
Potassium: 3.2 mmol/L — ABNORMAL LOW (ref 3.5–5.1)
Sodium: 135 mmol/L (ref 135–145)
Total Bilirubin: 0.8 mg/dL (ref 0.3–1.2)
Total Protein: 6.9 g/dL (ref 6.5–8.1)

## 2021-06-07 LAB — CBC WITH DIFFERENTIAL/PLATELET
Abs Immature Granulocytes: 0.33 10*3/uL — ABNORMAL HIGH (ref 0.00–0.07)
Basophils Absolute: 0.1 10*3/uL (ref 0.0–0.1)
Basophils Relative: 1 %
Eosinophils Absolute: 0.1 10*3/uL (ref 0.0–0.5)
Eosinophils Relative: 2 %
HCT: 36.9 % (ref 36.0–46.0)
Hemoglobin: 11.8 g/dL — ABNORMAL LOW (ref 12.0–15.0)
Immature Granulocytes: 5 %
Lymphocytes Relative: 22 %
Lymphs Abs: 1.5 10*3/uL (ref 0.7–4.0)
MCH: 31.2 pg (ref 26.0–34.0)
MCHC: 32 g/dL (ref 30.0–36.0)
MCV: 97.6 fL (ref 80.0–100.0)
Monocytes Absolute: 0.6 10*3/uL (ref 0.1–1.0)
Monocytes Relative: 9 %
Neutro Abs: 4.3 10*3/uL (ref 1.7–7.7)
Neutrophils Relative %: 61 %
Platelets: 197 10*3/uL (ref 150–400)
RBC: 3.78 MIL/uL — ABNORMAL LOW (ref 3.87–5.11)
RDW: 16.4 % — ABNORMAL HIGH (ref 11.5–15.5)
WBC: 6.9 10*3/uL (ref 4.0–10.5)
nRBC: 0.6 % — ABNORMAL HIGH (ref 0.0–0.2)

## 2021-06-07 LAB — PROTEIN, URINE, RANDOM: Total Protein, Urine: 9 mg/dL

## 2021-06-07 MED ORDER — ATROPINE SULFATE 1 MG/ML IJ SOLN
0.5000 mg | Freq: Once | INTRAMUSCULAR | Status: AC
  Administered 2021-06-07: 0.5 mg via INTRAVENOUS
  Filled 2021-06-07: qty 1

## 2021-06-07 MED ORDER — SODIUM CHLORIDE 0.9 % IV SOLN
2400.0000 mg/m2 | INTRAVENOUS | Status: DC
  Administered 2021-06-07: 4500 mg via INTRAVENOUS
  Filled 2021-06-07: qty 90

## 2021-06-07 MED ORDER — SODIUM CHLORIDE 0.9 % IV SOLN
Freq: Once | INTRAVENOUS | Status: AC
Start: 2021-06-07 — End: 2021-06-07
  Filled 2021-06-07: qty 250

## 2021-06-07 MED ORDER — SODIUM CHLORIDE 0.9 % IV SOLN
150.0000 mg/m2 | Freq: Once | INTRAVENOUS | Status: AC
  Administered 2021-06-07: 280 mg via INTRAVENOUS
  Filled 2021-06-07: qty 10

## 2021-06-07 MED ORDER — SODIUM CHLORIDE 0.9 % IV SOLN
10.0000 mg | Freq: Once | INTRAVENOUS | Status: AC
  Administered 2021-06-07: 10 mg via INTRAVENOUS
  Filled 2021-06-07: qty 10

## 2021-06-07 MED ORDER — SODIUM CHLORIDE 0.9% FLUSH
10.0000 mL | INTRAVENOUS | Status: DC | PRN
  Administered 2021-06-07: 10 mL via INTRAVENOUS
  Filled 2021-06-07: qty 10

## 2021-06-07 MED ORDER — SODIUM CHLORIDE 0.9 % IV SOLN
750.0000 mg | Freq: Once | INTRAVENOUS | Status: AC
  Administered 2021-06-07: 750 mg via INTRAVENOUS
  Filled 2021-06-07: qty 37.5

## 2021-06-07 MED ORDER — DIPHENHYDRAMINE HCL 50 MG/ML IJ SOLN
25.0000 mg | Freq: Once | INTRAMUSCULAR | Status: AC
  Administered 2021-06-07: 25 mg via INTRAVENOUS
  Filled 2021-06-07: qty 1

## 2021-06-07 MED ORDER — MONTELUKAST SODIUM 10 MG PO TABS
10.0000 mg | ORAL_TABLET | Freq: Once | ORAL | Status: AC
  Administered 2021-06-07: 10 mg via ORAL
  Filled 2021-06-07: qty 1

## 2021-06-07 MED ORDER — DIPHENHYDRAMINE HCL 50 MG/ML IJ SOLN
25.0000 mg | Freq: Once | INTRAMUSCULAR | Status: AC
  Administered 2021-06-07: 25 mg via INTRAVENOUS

## 2021-06-07 MED ORDER — PALONOSETRON HCL INJECTION 0.25 MG/5ML
0.2500 mg | Freq: Once | INTRAVENOUS | Status: AC
  Administered 2021-06-07: 0.25 mg via INTRAVENOUS
  Filled 2021-06-07: qty 5

## 2021-06-07 MED ORDER — FLUOROURACIL CHEMO INJECTION 2.5 GM/50ML
400.0000 mg/m2 | Freq: Once | INTRAVENOUS | Status: AC
  Administered 2021-06-07: 750 mg via INTRAVENOUS
  Filled 2021-06-07: qty 15

## 2021-06-07 MED ORDER — SODIUM CHLORIDE 0.9 % IV SOLN
5.0000 mg/kg | Freq: Once | INTRAVENOUS | Status: AC
  Administered 2021-06-07: 400 mg via INTRAVENOUS
  Filled 2021-06-07: qty 16

## 2021-06-07 MED ORDER — POTASSIUM CHLORIDE CRYS ER 20 MEQ PO TBCR
20.0000 meq | EXTENDED_RELEASE_TABLET | Freq: Two times a day (BID) | ORAL | 3 refills | Status: DC
Start: 2021-06-07 — End: 2021-08-16

## 2021-06-07 NOTE — Patient Instructions (Signed)
[facility] ONCOLOGY  Discharge Instructions: Thank you for choosing [facility] to provide your oncology and hematology care.  If you have a lab appointment with the [facility], please go directly to the [facility] and check in at the registration area.  Wear comfortable clothing and clothing appropriate for easy access to any Portacath or PICC line.   We strive to give you quality time with your provider. You may need to reschedule your appointment if you arrive late (15 or more minutes).  Arriving late affects you and other patients whose appointments are after yours.  Also, if you miss three or more appointments without notifying the office, you may be dismissed from the clinic at the provider's discretion.      For prescription refill requests, have your pharmacy contact our office and allow 72 hours for refills to be completed.    Today you received the following chemotherapy and/or immunotherapy agents Avastin, Irinotecan, adrucil    To help prevent nausea and vomiting after your treatment, we encourage you to take your nausea medication as directed.  BELOW ARE SYMPTOMS THAT SHOULD BE REPORTED IMMEDIATELY: *FEVER GREATER THAN 100.4 F (38 C) OR HIGHER *CHILLS OR SWEATING *NAUSEA AND VOMITING THAT IS NOT CONTROLLED WITH YOUR NAUSEA MEDICATION *UNUSUAL SHORTNESS OF BREATH *UNUSUAL BRUISING OR BLEEDING *URINARY PROBLEMS (pain or burning when urinating, or frequent urination) *BOWEL PROBLEMS (unusual diarrhea, constipation, pain near the anus) TENDERNESS IN MOUTH AND THROAT WITH OR WITHOUT PRESENCE OF ULCERS (sore throat, sores in mouth, or a toothache) UNUSUAL RASH, SWELLING OR PAIN  UNUSUAL VAGINAL DISCHARGE OR ITCHING   Items with * indicate a potential emergency and should be followed up as soon as possible or go to the Emergency Department if any problems should occur.  Please show the CHEMOTHERAPY ALERT CARD or IMMUNOTHERAPY ALERT  CARD at check-in to the Emergency Department and triage nurse.  Should you have questions after your visit or need to cancel or reschedule your appointment, please contact [facility]  [phone] and follow the prompts.  Office hours are 8:00 a.m. to 4:30 p.m. Monday - Friday. Please note that voicemails left after 4:00 p.m. may not be returned until the following business day.  We are closed weekends and major holidays. You have access to a nurse at all times for urgent questions. Please call the main number to the clinic [phone] and follow the prompts.  For any non-urgent questions, you may also contact your provider using MyChart. We now offer e-Visits for anyone 8 and older to request care online for non-urgent symptoms. For details visit [email or web address].   Also download the MyChart app! Go to the app store, search "MyChart", open the app, select [facility], and log in with your MyChart username and password.  Due to Covid, a mask is required upon entering the hospital/clinic. If you do not have a mask, one will be given to you upon arrival. For doctor visits, patients may have 1 support person aged 62 or older with them. For treatment visits, patients cannot have anyone with them due to current Covid guidelines and our immunocompromised population.

## 2021-06-07 NOTE — Progress Notes (Signed)
Hematology/Oncology progress note [facility] [phone] [phone]   Patient Care Team: Olin Hauser, DO as PCP - General (Family Medicine) Clent Jacks, RN as Registered Nurse Earlie Server, MD as Consulting Physician (Hematology and Oncology)  CHIEF COMPLAINTS/REASON FOR VISIT:  Follow up for rectal cancer  HISTORY OF PRESENTING ILLNESS:  Patient initially presented with complaints of postmenopausal bleeding on 08/16/2018.  History of was menopausal vaginal bleeding in 2016 which resulted in cervical polypectomy.  Pathology 02/04/2015 showed cervical polyp, consistent with benign endometrial polyp.  [name] after polypectomy due to anxiety associated with pelvic exams.  pelvic exam on 08/16/2018 reviewed cervical abnormality and from enlarged uterus. Seen by Dr. Marcelline Mates on 10/29/2018.  Endometrial biopsy and a Pap smear was performed. 10/29/2018 Pap smear showed adenocarcinoma, favor endometrial origin. 10/29/2018 endometrial biopsy showed endometrioid carcinoma, FIGO grade 1.  10/29/2018- TA & TV Ultrasound revealed: Anteverted uterus measuring 8.7 x 5.6 x 6.4 cm without evidence of focal masses.  The endometrium measuring 24.1 mm (thickened) and heterogeneous.  Right and left ovaries not visualized.  No adnexal masses identified.  No free fluid in cul-de-sac.  Patient was seen by Dr. Theora Gianotti in clinic on 11/13/2018.  Cervical exam reveals 2 cm exophytic irregular mass consistent with malignancy.   11/19/2018 CT chest abdomen pelvis with contrast showed thickened endometrium with some irregularity compatible with the provided diagnosis of endometrial malignancy.  There is a mildly prominent left inguinal node 1.4 cm.  Patient was seen by Dr. Fransisca Connors on 11/20/2018 and left groin lymph node biopsy was recommended.  11/26/2018 patient underwent left inguinal lymph node biopsy. Pathology showed metastatic adenocarcinoma consistent  with colorectal origin.  CDX 2+.  Case was discussed on tumor board.  Recommend colonoscopy for further evaluation.  Patient reports significant weight loss 30 pounds over the last year.  Chronic vaginal spotting. Change of bowel habits the past few months.  More constipated.  Family history positive for brother who has colon cancer prostate cancer.  patient has underwent colonoscopy on 12/03/2018 which reviewed a nonobstructing large mass in the rectum.  Also chronic fistula.  Mass was not circumferential.  This was biopsied with a cold forceps for histology.  Pathology came back hyperplastic polyp negative for dysplasia and malignancy. [name], patient underwent flex sigmoidoscopy on 12/06/2018 with rebiopsy of the rectal mass. This time biopsy results came back positive for invasive colorectal adenocarcinoma, moderately differentiated. Immunotherapy for nearly mismatch repair protein (MMR ) was performed.  There is no loss of MMR expression.  low probability of MSI high.   # Seen by [name] for evaluation of resectability for rectal cancer. In addition, she also had a second opinion with [facility] where her endometrial biopsy pathology was changed to  adenocarcinoma, consistent with colorectal primary.   [name]. She has home health that has been assisting with ostomy care  Patient was also evaluated by [facility] oncology.  Recommendation is to proceed with TNT with concurrent chemoradiation followed by neoadjuvant chemotherapy followed by surgical resection. Patient prefers to have treatment done locally with [facility]   # Oncology Treatment:  02/03/2019- 03/19/2019  concurrent Xeloda and radiation.  Xeloda dose 828m /m2 BID - rounded to 16548mBID- on days of radiation. 04/09/2019, started on FOLFOX with bolus 5-FU omitted.  07/16/2019 finished 8 cycles of FOLFOX. 09/17/19 APR/posterior vaginectomy/TAH/BSO/VY-flap pT4b pN0 with  close vaginal margin 0.2 mm.  Uterus and ovaries negative  for malignancy. Patient reports bilateral lower extremity numbness and tingling, intermittent, left worse than right. She has lost a lot of weight since her APR surgery.   #Family history with half brother having's history of colon cancer prostate cancer.  Personal history of colorectal cancer.  Patient has not decided if she wants genetic testing.    # history of PE( 01/13/2020)  in the bilateral lower extremity DVT (01/13/2020).   She finishes 6 months of anticoagulation with Eliquis 5 mg twice daily. Now switched to Eliquis 2.5 mg twice daily..  # She has now developed recurrent disease. #06/30/20  vaginal introitus mass biopsied. Pathology is consistent with metastatic colorectal adenocarcinoma I have discussed with [name]  Dr. Hester Mates and the mass is not resectable. Patient has also had colonoscopy by Dr. Vicente Males yesterday. Normal examination. # 07/16/2020 cycle 1 FOLFIRI  # 07/20/2020 PET scan was done for further evaluation, images are consistent with local recurrence, no distant metastasis. #Discussed with radiation oncology Dr. Baruch Gouty will recommends concurrent chemotherapy and radiation. 08/02/2020-08/16/2020, patient starts radiation.  Xeloda was held due to neutropenia 08/17/2020,-09/06/2020 Xeloda 1500 mg twice daily concurrently with radiation  01/31/21 started on FOLFIRI + Bev   INTERVAL HISTORY TAJANAE GUILBAULT is a 75 y.o. female who has above history reviewed by me presents for follow-up of rectal cancer. She tolerates chemotherapy well.  She denies  Weight is stable.  Denies any abdominal pain, nausea or vomiting.  Weight is stable. Patient has had intermittent loose bowel movements.  Symptoms are improved with Imodium as needed.   Review of Systems  Constitutional:  Negative for appetite change, chills, fatigue, fever and unexpected weight change.  HENT:   Negative for hearing loss and voice change.   Eyes:   Negative for eye problems.  Respiratory:  Negative for chest tightness and cough.   Cardiovascular:  Negative for chest pain.  Gastrointestinal:  Positive for diarrhea. Negative for abdominal distention, abdominal pain, blood in stool, constipation and nausea.  Endocrine: Negative for hot flashes.  Genitourinary:  Negative for difficulty urinating and frequency.   Musculoskeletal:  Negative for arthralgias.  Skin:  Negative for itching and rash.  Neurological:  Negative for extremity weakness and numbness.  Hematological:  Negative for adenopathy.  Psychiatric/Behavioral:  Negative for confusion.    MEDICAL HISTORY:  Past Medical History:  Diagnosis Date   Allergy    Arthritis    Blood clot in vein    Family history of colon cancer    GERD (gastroesophageal reflux disease)    Hypercholesteremia    Hypertension    Hypertension    Lower extremity edema    Rectal cancer ([place]) 12/2018   Urinary incontinence     SURGICAL HISTORY: Past Surgical History:  Procedure Laterality Date   ABDOMINAL HYSTERECTOMY     CHOLECYSTECTOMY  1971   COLONOSCOPY WITH PROPOFOL N/A 12/03/2018   Procedure: COLONOSCOPY WITH PROPOFOL;  Surgeon: Lucilla Lame, MD;  Location: [facility] ENDOSCOPY;  Service: Endoscopy;  Laterality: N/A;   COLONOSCOPY WITH PROPOFOL N/A 07/15/2020   Procedure: COLONOSCOPY WITH PROPOFOL;  Surgeon: [name], MD;  Location[facility] ENDOSCOPY;  Service: Gastroenterology;  Laterality: N/A;   FLEXIBLE SIGMOIDOSCOPY N/A 12/06/2018   Procedure: FLEXIBLE SIGMOIDOSCOPY;  Surgeon: [name], MD;  Location: [facility] ENDOSCOPY;  Service: Endoscopy;  Laterality: N/A;   LAPAROSCOPIC COLOSTOMY  01/06/2019   PORTACATH PLACEMENT N/A 04/03/2019   Procedure: INSERTION PORT-A-CATH;  Surgeon: Jules Husbands, MD;  Location: [facility] ORS;  Service: General;  Laterality:  N/A;    SOCIAL HISTORY: Social History   Socioeconomic History   Marital status: Widowed    Spouse name: Not on file   Number of children:  Not on file   Years of education: Not on file   Highest education level: Not on file  Occupational History   Not on file  Tobacco Use   Smoking status: Former    Types: Cigarettes    Quit date: 12/02/1977    Years since quitting: 43.5   Smokeless tobacco: Former  Scientific laboratory technician Use: Never used  Substance and Sexual Activity   Alcohol use: Never   Drug use: Never   Sexual activity: Not Currently    Birth control/protection: None  Other Topics Concern   Not on file  Social History Narrative   Lives with daughter   Social Determinants of Health   Financial Resource Strain: Not on file  Food Insecurity: Not on file  Transportation Needs: Not on file  Physical Activity: Not on file  Stress: Not on file  Social Connections: Not on file  Intimate Partner Violence: Not on file    FAMILY HISTORY: Family History  Problem Relation Age of Onset   Colon cancer Brother 36       exposure to chemicals [place]   Hypertension Mother    Stroke Mother    Kidney failure Father    Breast cancer Neg Hx    Ovarian cancer Neg Hx     ALLERGIES:  is allergic to sulfamethoxazole-trimethoprim.  MEDICATIONS:  Current Outpatient Medications  Medication Sig Dispense Refill   apixaban (ELIQUIS) 2.5 MG TABS tablet Take 1 tablet (2.5 mg total) by mouth 2 (two) times daily. 180 tablet 1   Cholecalciferol (VITAMIN D3) 2000 units capsule Take 2,000 Units by mouth daily.     diclofenac sodium (VOLTAREN) 1 % GEL Apply 2 g topically 4 (four) times daily as needed (joint pain).  11   fluticasone (FLONASE) 50 MCG/ACT nasal spray Place 1 spray into both nostrils daily. 16 g 1   gabapentin (NEURONTIN) 100 MG capsule Take 1 capsule (100 mg total) by mouth at bedtime. 30 capsule 0   ipratropium (ATROVENT) 0.03 % nasal spray Place 1 spray into both nostrils 2 (two) times daily. 30 mL 2   lidocaine-prilocaine (EMLA) cream Apply 1 application topically as needed. 30 g 6   loperamide (IMODIUM) 2 MG  capsule Take 1 capsule (2 mg total) by mouth See admin instructions. With onset of loose stool, take 68m followed by 234mevery 2 hours,  Maximum: 16 mg/day 120 capsule 1   loratadine (CLARITIN) 10 MG tablet Take 10 mg by mouth daily.     Multiple Vitamins-Minerals (ONE-A-DAY WOMENS 50 PLUS PO) Take 1 tablet by mouth daily.      ondansetron (ZOFRAN) 8 MG tablet Take 1 tablet (8 mg total) by mouth 2 (two) times daily as needed for refractory nausea / vomiting. Start on day 3 after chemotherapy. 30 tablet 1   potassium chloride SA (KLOR-CON) 20 MEQ tablet Take 1 tablet (20 mEq total) by mouth daily. 90 tablet 3   prochlorperazine (COMPAZINE) 10 MG tablet Take 1 tablet (10 mg total) by mouth every 6 (six) hours as needed (NAUSEA). 30 tablet 1   simvastatin (ZOCOR) 40 MG tablet Take 1 tablet (40 mg total) by mouth at bedtime. 90 tablet 3   triamterene-hydrochlorothiazide (DYAZIDE) 37.5-25 MG capsule Take 1 each (1 capsule total) by mouth daily. 90 capsule 3  zinc gluconate 50 MG tablet Take 50 mg by mouth daily.     No current facility-administered medications for this visit.     PHYSICAL EXAMINATION: ECOG PERFORMANCE STATUS: 1 - Symptomatic but completely ambulatory  Physical Exam Constitutional:      General: She is not in acute distress. HENT:     Head: Normocephalic and atraumatic.  Eyes:     General: No scleral icterus. Cardiovascular:     Rate and Rhythm: Normal rate and regular rhythm.     Heart sounds: Normal heart sounds.  Pulmonary:     Effort: Pulmonary effort is normal. No respiratory distress.     Breath sounds: No wheezing.  Abdominal:     General: Bowel sounds are normal. There is no distension.     Palpations: Abdomen is soft.     Comments: + Colostomy bag   Musculoskeletal:        General: No deformity. Normal range of motion.     Cervical back: Normal range of motion and neck supple.  Skin:    General: Skin is warm and dry.     Findings: No erythema or rash.   Neurological:     Mental Status: She is alert and oriented to person, place, and time. Mental status is at baseline.     Cranial Nerves: No cranial nerve deficit.     Coordination: Coordination normal.   CMP Latest Ref Rng & Units 05/24/2021  Glucose 70 - 99 mg/dL 101(H)  BUN 8 - 23 mg/dL 10  Creatinine 0.44 - 1.00 mg/dL 1.04(H)  Sodium 135 - 145 mmol/L 137  Potassium 3.5 - 5.1 mmol/L 3.4(L)  Chloride 98 - 111 mmol/L 100  CO2 22 - 32 mmol/L 29  Calcium 8.9 - 10.3 mg/dL 9.4  Total Protein 6.5 - 8.1 g/dL 7.2  Total Bilirubin 0.3 - 1.2 mg/dL 0.4  Alkaline Phos 38 - 126 U/L 70  AST 15 - 41 U/L 29  ALT 0 - 44 U/L 15   CBC Latest Ref Rng & Units 05/24/2021  WBC 4.0 - 10.5 K/uL 6.3  Hemoglobin 12.0 - 15.0 g/dL 12.2  Hematocrit 36.0 - 46.0 % 38.1  Platelets 150 - 400 K/uL 223    LABORATORY DATA:  I have reviewed the data as listed Lab Results  Component Value Date   WBC 6.3 05/24/2021   HGB 12.2 05/24/2021   HCT 38.1 05/24/2021   MCV 97.2 05/24/2021   PLT 223 05/24/2021   Recent Labs    06/23/20 1124 07/16/20 0802 07/23/20 1345 04/26/21 0829 05/10/21 0805 05/24/21 0800  NA 140 140   < > 137 137 137  K 3.7 3.2*   < > 3.5 3.3* 3.4*  CL 100 101   < > 101 101 100  CO2 30 29   < > '26 26 29  ' GLUCOSE 114* 114*   < > 100* 102* 101*  BUN 10 9   < > '8 9 10  ' CREATININE 0.97 0.87   < > 0.92 1.02* 1.04*  CALCIUM 9.1 9.2   < > 9.3 9.3 9.4  GFRNONAA 58* >60   < > >60 58* 56*  GFRAA >60 >60  --   --   --   --   PROT 7.0 6.9   < > 7.0 6.9 7.2  ALBUMIN 4.0 3.6   < > 4.1 4.2 4.4  AST 25 24   < > '29 30 29  ' ALT 15 14   < > 18 16 15  ALKPHOS 41 41   < > 74 68 70  BILITOT 0.4 0.5   < > 0.5 0.4 0.4   < > = values in this interval not displayed.    Iron/TIBC/Ferritin/ %Sat    Component Value Date/Time   IRON 40 06/23/2020 1124   TIBC 246 (L) 06/23/2020 1124   FERRITIN 338 (H) 06/23/2020 1124   IRONPCTSAT 16 06/23/2020 1124     RADIOGRAPHIC STUDIES: I have personally reviewed  the radiological images as listed and agreed with the findings in the report. CT CHEST ABDOMEN PELVIS W CONTRAST  Result Date: 05/19/2021 CLINICAL DATA:  Rectal cancer restaging, vaginal recurrence, ongoing chemotherapy EXAM: CT CHEST, ABDOMEN, AND PELVIS WITH CONTRAST TECHNIQUE: Multidetector CT imaging of the chest, abdomen and pelvis was performed following the standard protocol during bolus administration of intravenous contrast. CONTRAST:  158m OMNIPAQUE IOHEXOL 300 MG/ML SOLN, additional oral enteric contrast COMPARISON:  01/19/2021 FINDINGS: CT CHEST FINDINGS Cardiovascular: Right chest port catheter. Aortic atherosclerosis. Normal heart size. Scattered left and right coronary artery calcifications enlargement of the main pulmonary artery measuring up to 3.7 cm in caliber. No pericardial effusion. Mediastinum/Nodes: No enlarged mediastinal, hilar, or axillary lymph nodes. Thyroid gland, trachea, and esophagus demonstrate no significant findings. Lungs/Pleura: Minimal, bandlike scarring of the bilateral lung bases. Interval decrease in size of multiple small bilateral pulmonary nodules, for example a 5 mm nodule of the medial segment right middle lobe, previously 8 mm (series 4, image 97) and a 2 mm nodule of the posterior right upper lobe, previously 4 mm (series 4, image 48). No pleural effusion or pneumothorax. Musculoskeletal: No chest wall mass or suspicious bone lesions identified. CT ABDOMEN PELVIS FINDINGS Hepatobiliary: No focal liver abnormality is seen. Status post cholecystectomy. Postoperative biliary dilatation. Pancreas: Unremarkable. No pancreatic ductal dilatation or surrounding inflammatory changes. Spleen: Normal in size without significant abnormality. Adrenals/Urinary Tract: Adrenal glands are unremarkable. Multiple bilateral renal cysts. Kidneys are otherwise normal, without renal calculi, solid lesion, or hydronephrosis. Bladder is unremarkable. Stomach/Bowel: Stomach is within normal  limits. Appendix appears normal. No evidence of bowel wall thickening, distention, or inflammatory changes. Redemonstrated postoperative findings of abdominoperineal resection with left lower quadrant end colostomy. Diverticulosis of the remnant sigmoid. Vascular/Lymphatic: Aortic atherosclerosis. Previously noted enlargement of bilateral inguinal lymph nodes is resolved, left inguinal node measuring 1.5 x 0.8 cm, previously 2.5 x 1.4 cm (series 2, image 105). Reproductive: Status post hysterectomy. Some appearance of vaginal soft tissue thickening, in keeping with report of vaginal recurrence (series 2, image 114). Other: No abdominal wall hernia or abnormality. No abdominopelvic ascites. Musculoskeletal: No acute or significant osseous findings. IMPRESSION: 1. Redemonstrated postoperative findings of abdominoperineal resection with left lower quadrant end colostomy. 2. Some appearance of vaginal soft tissue thickening, in keeping with report of vaginal recurrence of rectal malignancy. This would be better assessed by MRI and physical examination. 3. Previously noted enlargement of bilateral inguinal lymph nodes is resolved, consistent with treatment response of nodal metastatic disease. 4. Interval decrease in size of multiple small bilateral pulmonary nodules, consistent with treatment response of pulmonary metastatic disease. 5. No evidence of new metastatic disease in the chest, abdomen, or pelvis. 6. Coronary artery disease. Aortic Atherosclerosis (ICD10-I70.0). Electronically Signed   By: AEddie CandleM.D.   On: 05/19/2021 10:40      CT CHEST ABDOMEN PELVIS W CONTRAST  Result Date: 05/19/2021 CLINICAL DATA:  Rectal cancer restaging, vaginal recurrence, ongoing chemotherapy EXAM: CT CHEST, ABDOMEN, AND PELVIS WITH CONTRAST TECHNIQUE: Multidetector CT imaging  of the chest, abdomen and pelvis was performed following the standard protocol during bolus administration of intravenous contrast. CONTRAST:  178m  OMNIPAQUE IOHEXOL 300 MG/ML SOLN, additional oral enteric contrast COMPARISON:  01/19/2021 FINDINGS: CT CHEST FINDINGS Cardiovascular: Right chest port catheter. Aortic atherosclerosis. Normal heart size. Scattered left and right coronary artery calcifications enlargement of the main pulmonary artery measuring up to 3.7 cm in caliber. No pericardial effusion. Mediastinum/Nodes: No enlarged mediastinal, hilar, or axillary lymph nodes. Thyroid gland, trachea, and esophagus demonstrate no significant findings. Lungs/Pleura: Minimal, bandlike scarring of the bilateral lung bases. Interval decrease in size of multiple small bilateral pulmonary nodules, for example a 5 mm nodule of the medial segment right middle lobe, previously 8 mm (series 4, image 97) and a 2 mm nodule of the posterior right upper lobe, previously 4 mm (series 4, image 48). No pleural effusion or pneumothorax. Musculoskeletal: No chest wall mass or suspicious bone lesions identified. CT ABDOMEN PELVIS FINDINGS Hepatobiliary: No focal liver abnormality is seen. Status post cholecystectomy. Postoperative biliary dilatation. Pancreas: Unremarkable. No pancreatic ductal dilatation or surrounding inflammatory changes. Spleen: Normal in size without significant abnormality. Adrenals/Urinary Tract: Adrenal glands are unremarkable. Multiple bilateral renal cysts. Kidneys are otherwise normal, without renal calculi, solid lesion, or hydronephrosis. Bladder is unremarkable. Stomach/Bowel: Stomach is within normal limits. Appendix appears normal. No evidence of bowel wall thickening, distention, or inflammatory changes. Redemonstrated postoperative findings of abdominoperineal resection with left lower quadrant end colostomy. Diverticulosis of the remnant sigmoid. Vascular/Lymphatic: Aortic atherosclerosis. Previously noted enlargement of bilateral inguinal lymph nodes is resolved, left inguinal node measuring 1.5 x 0.8 cm, previously 2.5 x 1.4 cm (series 2,  image 105). Reproductive: Status post hysterectomy. Some appearance of vaginal soft tissue thickening, in keeping with report of vaginal recurrence (series 2, image 114). Other: No abdominal wall hernia or abnormality. No abdominopelvic ascites. Musculoskeletal: No acute or significant osseous findings. IMPRESSION: 1. Redemonstrated postoperative findings of abdominoperineal resection with left lower quadrant end colostomy. 2. Some appearance of vaginal soft tissue thickening, in keeping with report of vaginal recurrence of rectal malignancy. This would be better assessed by MRI and physical examination. 3. Previously noted enlargement of bilateral inguinal lymph nodes is resolved, consistent with treatment response of nodal metastatic disease. 4. Interval decrease in size of multiple small bilateral pulmonary nodules, consistent with treatment response of pulmonary metastatic disease. 5. No evidence of new metastatic disease in the chest, abdomen, or pelvis. 6. Coronary artery disease. Aortic Atherosclerosis (ICD10-I70.0). Electronically Signed   By: AEddie CandleM.D.   On: 05/19/2021 10:40     ASSESSMENT & PLAN:  1. Encounter for antineoplastic chemotherapy   2. Metastatic adenocarcinoma ([facility]   3. Chemotherapy induced neutropenia ([facility])   4. History of pulmonary embolism   5. Rectal cancer ([facility]   6. Hives   7. Hypokalemia   8. Chemotherapy induced diarrhea   Cancer Staging Rectal cancer ([facility] Staging form: Colon and Rectum, AJCC 8th Edition - Clinical stage from 01/23/2019: Stage IIIC (cT4b, cN1a, cM0) - Signed by YEarlie Server MD on 01/23/2019 - Pathologic stage from 10/06/2019: Stage IIC (ypT4b, pN0, cM0) - Signed by YEarlie Server MD on 10/06/2019 - Pathologic: Stage Unknown (rpTX, pNX, cM1) - Signed by YEarlie Server MD on 01/31/2021  #History of stage IIIC Rectal cancer, s/p TNT, followed by 09/17/19 APR/posterior vaginectomy/TAH/BSO/VY-flap, pT4b pN0 with close vaginal margin 0.2 mm.  Uterus and ovaries  negative for malignancy. palliative radiation to vaginal recurrence.  Finished 09/04/2020 01/19/21 recurrence with lung  metastasis.-Palliative chemotherapy CEA has decreased.  -Neulasta on Day 3, Claritin for 4 days. Labs are reviewed and discussed with patient.  Proceed with FOLFIRI plus bevacizumab today  # Post treatment hives, likely due to chemotherapy. She gets additional one dose of IV benadryl 55m x 1. Recommend her to take oral benadryl as instructed PRN hive at home.   #Chemotherapy-induced neutropenia, see above.  On GCSF  #Chemotherapy-induced diarrhea, symptoms are stable.  Continue as needed antidiarrhea medications..Marland Kitchen #Hypokalemia, potassium 3.2, recommend patient to increase to potassium chloride 20 mEq twice daily  #History of pulmonary embolism, history of bilateral DVT New Eliquis 2.5 mg twice daily.   Follow-up 2 weeks for next cycle of treatment.  ZEarlie Server MD, PhD

## 2021-06-07 NOTE — Progress Notes (Signed)
Post irinotecan and leucovorin infusion patient developed mild hives across her chest and abdomen area. Pt has no other complaints or symptoms. Pt develops hives after each irinotecan and leucovorin infusion, Dr. Tasia Catchings has been aware of these episodes. Benadryl 25 mg IV given to patient after irinotecan and leucovorin infusion as ordered. Pt.'s vitals are stable, RN educated pt on the importance of notifying the clinic if any complications occur at home and when to seek emergency care. Pt verbalizes understanding and all questions answered at this time. Pt stable for discharge, vitals stable.   Aily Tzeng [employer]

## 2021-06-09 ENCOUNTER — Other Ambulatory Visit: Payer: Self-pay

## 2021-06-09 ENCOUNTER — Inpatient Hospital Stay: Payer: Medicare Other

## 2021-06-09 VITALS — BP 135/55 | HR 66 | Temp 97.0°F | Resp 17

## 2021-06-09 DIAGNOSIS — Z800 Family history of malignant neoplasm of digestive organs: Secondary | ICD-10-CM | POA: Diagnosis not present

## 2021-06-09 DIAGNOSIS — K219 Gastro-esophageal reflux disease without esophagitis: Secondary | ICD-10-CM | POA: Diagnosis not present

## 2021-06-09 DIAGNOSIS — C20 Malignant neoplasm of rectum: Secondary | ICD-10-CM | POA: Diagnosis not present

## 2021-06-09 DIAGNOSIS — Z86718 Personal history of other venous thrombosis and embolism: Secondary | ICD-10-CM | POA: Diagnosis not present

## 2021-06-09 DIAGNOSIS — R197 Diarrhea, unspecified: Secondary | ICD-10-CM | POA: Diagnosis not present

## 2021-06-09 DIAGNOSIS — Z5111 Encounter for antineoplastic chemotherapy: Secondary | ICD-10-CM | POA: Diagnosis not present

## 2021-06-09 DIAGNOSIS — E876 Hypokalemia: Secondary | ICD-10-CM | POA: Diagnosis not present

## 2021-06-09 DIAGNOSIS — Z87891 Personal history of nicotine dependence: Secondary | ICD-10-CM | POA: Diagnosis not present

## 2021-06-09 DIAGNOSIS — Z79899 Other long term (current) drug therapy: Secondary | ICD-10-CM | POA: Diagnosis not present

## 2021-06-09 DIAGNOSIS — Z86711 Personal history of pulmonary embolism: Secondary | ICD-10-CM | POA: Diagnosis not present

## 2021-06-09 DIAGNOSIS — C799 Secondary malignant neoplasm of unspecified site: Secondary | ICD-10-CM

## 2021-06-09 DIAGNOSIS — D709 Neutropenia, unspecified: Secondary | ICD-10-CM | POA: Diagnosis not present

## 2021-06-09 DIAGNOSIS — I10 Essential (primary) hypertension: Secondary | ICD-10-CM | POA: Diagnosis not present

## 2021-06-09 DIAGNOSIS — Z5189 Encounter for other specified aftercare: Secondary | ICD-10-CM | POA: Diagnosis not present

## 2021-06-09 DIAGNOSIS — E7800 Pure hypercholesterolemia, unspecified: Secondary | ICD-10-CM | POA: Diagnosis not present

## 2021-06-09 DIAGNOSIS — R609 Edema, unspecified: Secondary | ICD-10-CM | POA: Diagnosis not present

## 2021-06-09 MED ORDER — PEGFILGRASTIM 6 MG/0.6ML ~~LOC~~ PSKT
6.0000 mg | PREFILLED_SYRINGE | Freq: Once | SUBCUTANEOUS | Status: AC
  Administered 2021-06-09: 6 mg via SUBCUTANEOUS
  Filled 2021-06-09: qty 0.6

## 2021-06-09 MED ORDER — HEPARIN SOD (PORK) LOCK FLUSH 100 UNIT/ML IV SOLN
INTRAVENOUS | Status: AC
  Administered 2021-06-09: 500 [IU]
  Filled 2021-06-09: qty 5

## 2021-06-09 MED ORDER — SODIUM CHLORIDE 0.9% FLUSH
10.0000 mL | INTRAVENOUS | Status: DC | PRN
  Administered 2021-06-09: 10 mL
  Filled 2021-06-09: qty 10

## 2021-06-09 MED ORDER — HEPARIN SOD (PORK) LOCK FLUSH 100 UNIT/ML IV SOLN
500.0000 [IU] | Freq: Once | INTRAVENOUS | Status: AC | PRN
  Filled 2021-06-09: qty 5

## 2021-06-09 NOTE — Progress Notes (Signed)
1305: pt went to pull her sleeve down over the Neulasta On Pro and the On Pro came off. Matt in Pharmacy aware.  1317: New On Pro placed.  Pt stable at discharge.

## 2021-06-21 ENCOUNTER — Other Ambulatory Visit: Payer: Self-pay | Admitting: Oncology

## 2021-06-21 ENCOUNTER — Other Ambulatory Visit: Payer: Self-pay

## 2021-06-21 ENCOUNTER — Encounter: Payer: Self-pay | Admitting: Oncology

## 2021-06-21 ENCOUNTER — Inpatient Hospital Stay: Payer: Medicare Other

## 2021-06-21 ENCOUNTER — Inpatient Hospital Stay (HOSPITAL_BASED_OUTPATIENT_CLINIC_OR_DEPARTMENT_OTHER): Payer: Medicare Other | Admitting: Oncology

## 2021-06-21 ENCOUNTER — Inpatient Hospital Stay: Payer: Medicare Other | Attending: Oncology

## 2021-06-21 VITALS — BP 108/62 | HR 82 | Temp 98.7°F | Resp 16 | Wt 169.0 lb

## 2021-06-21 DIAGNOSIS — Z933 Colostomy status: Secondary | ICD-10-CM | POA: Insufficient documentation

## 2021-06-21 DIAGNOSIS — Z90710 Acquired absence of both cervix and uterus: Secondary | ICD-10-CM | POA: Insufficient documentation

## 2021-06-21 DIAGNOSIS — Z923 Personal history of irradiation: Secondary | ICD-10-CM | POA: Insufficient documentation

## 2021-06-21 DIAGNOSIS — K219 Gastro-esophageal reflux disease without esophagitis: Secondary | ICD-10-CM | POA: Insufficient documentation

## 2021-06-21 DIAGNOSIS — C7982 Secondary malignant neoplasm of genital organs: Secondary | ICD-10-CM | POA: Insufficient documentation

## 2021-06-21 DIAGNOSIS — Z800 Family history of malignant neoplasm of digestive organs: Secondary | ICD-10-CM | POA: Diagnosis not present

## 2021-06-21 DIAGNOSIS — C7800 Secondary malignant neoplasm of unspecified lung: Secondary | ICD-10-CM | POA: Diagnosis not present

## 2021-06-21 DIAGNOSIS — Z90722 Acquired absence of ovaries, bilateral: Secondary | ICD-10-CM | POA: Diagnosis not present

## 2021-06-21 DIAGNOSIS — C20 Malignant neoplasm of rectum: Secondary | ICD-10-CM | POA: Insufficient documentation

## 2021-06-21 DIAGNOSIS — Z5111 Encounter for antineoplastic chemotherapy: Secondary | ICD-10-CM | POA: Insufficient documentation

## 2021-06-21 DIAGNOSIS — Z5189 Encounter for other specified aftercare: Secondary | ICD-10-CM | POA: Diagnosis not present

## 2021-06-21 DIAGNOSIS — R197 Diarrhea, unspecified: Secondary | ICD-10-CM | POA: Diagnosis not present

## 2021-06-21 DIAGNOSIS — E7800 Pure hypercholesterolemia, unspecified: Secondary | ICD-10-CM | POA: Insufficient documentation

## 2021-06-21 DIAGNOSIS — T451X5A Adverse effect of antineoplastic and immunosuppressive drugs, initial encounter: Secondary | ICD-10-CM

## 2021-06-21 DIAGNOSIS — C799 Secondary malignant neoplasm of unspecified site: Secondary | ICD-10-CM

## 2021-06-21 DIAGNOSIS — Z86718 Personal history of other venous thrombosis and embolism: Secondary | ICD-10-CM | POA: Diagnosis not present

## 2021-06-21 DIAGNOSIS — Z86711 Personal history of pulmonary embolism: Secondary | ICD-10-CM | POA: Insufficient documentation

## 2021-06-21 DIAGNOSIS — D702 Other drug-induced agranulocytosis: Secondary | ICD-10-CM | POA: Diagnosis not present

## 2021-06-21 DIAGNOSIS — R609 Edema, unspecified: Secondary | ICD-10-CM | POA: Insufficient documentation

## 2021-06-21 DIAGNOSIS — D701 Agranulocytosis secondary to cancer chemotherapy: Secondary | ICD-10-CM

## 2021-06-21 DIAGNOSIS — E876 Hypokalemia: Secondary | ICD-10-CM | POA: Insufficient documentation

## 2021-06-21 DIAGNOSIS — K521 Toxic gastroenteritis and colitis: Secondary | ICD-10-CM | POA: Insufficient documentation

## 2021-06-21 DIAGNOSIS — Z79899 Other long term (current) drug therapy: Secondary | ICD-10-CM | POA: Diagnosis not present

## 2021-06-21 DIAGNOSIS — Z8042 Family history of malignant neoplasm of prostate: Secondary | ICD-10-CM | POA: Insufficient documentation

## 2021-06-21 DIAGNOSIS — I10 Essential (primary) hypertension: Secondary | ICD-10-CM | POA: Diagnosis not present

## 2021-06-21 LAB — CBC WITH DIFFERENTIAL/PLATELET
Abs Immature Granulocytes: 0.19 10*3/uL — ABNORMAL HIGH (ref 0.00–0.07)
Basophils Absolute: 0 10*3/uL (ref 0.0–0.1)
Basophils Relative: 1 %
Eosinophils Absolute: 0.1 10*3/uL (ref 0.0–0.5)
Eosinophils Relative: 2 %
HCT: 37.3 % (ref 36.0–46.0)
Hemoglobin: 11.7 g/dL — ABNORMAL LOW (ref 12.0–15.0)
Immature Granulocytes: 3 %
Lymphocytes Relative: 25 %
Lymphs Abs: 1.7 10*3/uL (ref 0.7–4.0)
MCH: 31.2 pg (ref 26.0–34.0)
MCHC: 31.4 g/dL (ref 30.0–36.0)
MCV: 99.5 fL (ref 80.0–100.0)
Monocytes Absolute: 0.6 10*3/uL (ref 0.1–1.0)
Monocytes Relative: 9 %
Neutro Abs: 4.1 10*3/uL (ref 1.7–7.7)
Neutrophils Relative %: 60 %
Platelets: 208 10*3/uL (ref 150–400)
RBC: 3.75 MIL/uL — ABNORMAL LOW (ref 3.87–5.11)
RDW: 16.3 % — ABNORMAL HIGH (ref 11.5–15.5)
WBC: 6.6 10*3/uL (ref 4.0–10.5)
nRBC: 0.8 % — ABNORMAL HIGH (ref 0.0–0.2)

## 2021-06-21 LAB — COMPREHENSIVE METABOLIC PANEL
ALT: 27 U/L (ref 0–44)
AST: 27 U/L (ref 15–41)
Albumin: 4.1 g/dL (ref 3.5–5.0)
Alkaline Phosphatase: 87 U/L (ref 38–126)
Anion gap: 10 (ref 5–15)
BUN: 9 mg/dL (ref 8–23)
CO2: 26 mmol/L (ref 22–32)
Calcium: 9.2 mg/dL (ref 8.9–10.3)
Chloride: 101 mmol/L (ref 98–111)
Creatinine, Ser: 0.86 mg/dL (ref 0.44–1.00)
GFR, Estimated: >60 mL/min (ref >60)
Glucose, Bld: 104 mg/dL — ABNORMAL HIGH (ref 70–99)
Potassium: 3.6 mmol/L (ref 3.5–5.1)
Sodium: 137 mmol/L (ref 135–145)
Total Bilirubin: 0.2 mg/dL — ABNORMAL LOW (ref 0.3–1.2)
Total Protein: 7.1 g/dL (ref 6.5–8.1)

## 2021-06-21 LAB — PROTEIN, URINE, RANDOM: Total Protein, Urine: 21 mg/dL

## 2021-06-21 MED ORDER — MONTELUKAST SODIUM 10 MG PO TABS
10.0000 mg | ORAL_TABLET | Freq: Once | ORAL | Status: AC
  Administered 2021-06-21: 10 mg via ORAL
  Filled 2021-06-21: qty 1

## 2021-06-21 MED ORDER — SODIUM CHLORIDE 0.9 % IV SOLN
10.0000 mg | Freq: Once | INTRAVENOUS | Status: AC
  Administered 2021-06-21: 10 mg via INTRAVENOUS
  Filled 2021-06-21: qty 10

## 2021-06-21 MED ORDER — SODIUM CHLORIDE 0.9 % IV SOLN
750.0000 mg | Freq: Once | INTRAVENOUS | Status: AC
  Administered 2021-06-21: 750 mg via INTRAVENOUS
  Filled 2021-06-21: qty 37.5

## 2021-06-21 MED ORDER — SODIUM CHLORIDE 0.9 % IV SOLN
150.0000 mg/m2 | Freq: Once | INTRAVENOUS | Status: AC
  Administered 2021-06-21: 280 mg via INTRAVENOUS
  Filled 2021-06-21: qty 10

## 2021-06-21 MED ORDER — DIPHENHYDRAMINE HCL 50 MG/ML IJ SOLN
25.0000 mg | Freq: Once | INTRAMUSCULAR | Status: AC
  Administered 2021-06-21: 25 mg via INTRAVENOUS
  Filled 2021-06-21: qty 1

## 2021-06-21 MED ORDER — DIPHENHYDRAMINE HCL 50 MG/ML IJ SOLN
25.0000 mg | Freq: Once | INTRAMUSCULAR | Status: AC
Start: 2021-06-21 — End: 2021-06-21
  Administered 2021-06-21: 25 mg via INTRAVENOUS
  Filled 2021-06-21: qty 1

## 2021-06-21 MED ORDER — DIPHENOXYLATE-ATROPINE 2.5-0.025 MG PO TABS: 1.0000 | ORAL_TABLET | Freq: Four times a day (QID) | ORAL | 1 refills | Status: DC | PRN

## 2021-06-21 MED ORDER — SODIUM CHLORIDE 0.9 % IV SOLN
5.0000 mg/kg | Freq: Once | INTRAVENOUS | Status: AC
  Administered 2021-06-21: 400 mg via INTRAVENOUS
  Filled 2021-06-21: qty 16

## 2021-06-21 MED ORDER — SODIUM CHLORIDE 0.9 % IV SOLN
Freq: Once | INTRAVENOUS | Status: AC
  Filled 2021-06-21: qty 250

## 2021-06-21 MED ORDER — SODIUM CHLORIDE 0.9% FLUSH
10.0000 mL | Freq: Once | INTRAVENOUS | Status: AC
  Administered 2021-06-21: 10 mL via INTRAVENOUS
  Filled 2021-06-21: qty 10

## 2021-06-21 MED ORDER — PALONOSETRON HCL INJECTION 0.25 MG/5ML
0.2500 mg | Freq: Once | INTRAVENOUS | Status: AC
  Administered 2021-06-21: 0.25 mg via INTRAVENOUS
  Filled 2021-06-21: qty 5

## 2021-06-21 MED ORDER — FLUOROURACIL CHEMO INJECTION 5 GM/100ML
2400.0000 mg/m2 | INTRAVENOUS | Status: DC
  Administered 2021-06-21: 4500 mg via INTRAVENOUS
  Filled 2021-06-21: qty 90

## 2021-06-21 MED ORDER — FLUOROURACIL CHEMO INJECTION 2.5 GM/50ML
400.0000 mg/m2 | Freq: Once | INTRAVENOUS | Status: AC
  Administered 2021-06-21: 750 mg via INTRAVENOUS
  Filled 2021-06-21: qty 15

## 2021-06-21 MED ORDER — ATROPINE SULFATE 1 MG/ML IJ SOLN
0.5000 mg | Freq: Once | INTRAMUSCULAR | Status: AC
  Administered 2021-06-21: 0.5 mg via INTRAVENOUS
  Filled 2021-06-21: qty 1

## 2021-06-21 NOTE — Progress Notes (Signed)
Hematology/Oncology progress note [facility] [phone] [phone]   Patient Care Team: Karamalegos, Alexander J, DO as PCP - General (Family Medicine) Stanton, Kristi D, RN as Registered Nurse Jacqueline Thayne, MD as Consulting Physician (Hematology and Oncology)  CHIEF COMPLAINTS/REASON FOR VISIT:  Follow up for rectal cancer  HISTORY OF PRESENTING ILLNESS:  Patient initially presented with complaints of postmenopausal bleeding on 08/16/2018.  History of was menopausal vaginal bleeding in 2016 which resulted in cervical polypectomy.  Pathology 02/04/2015 showed cervical polyp, consistent with benign endometrial polyp.  [name] after polypectomy due to anxiety associated with pelvic exams.  pelvic exam on 08/16/2018 reviewed cervical abnormality and from enlarged uterus. Seen by Jacqueline Yoder on 10/29/2018.  Endometrial biopsy and a Pap smear was performed. 10/29/2018 Pap smear showed adenocarcinoma, favor endometrial origin. 10/29/2018 endometrial biopsy showed endometrioid carcinoma, FIGO grade 1.  10/29/2018- TA & TV Ultrasound revealed: Anteverted uterus measuring 8.7 x 5.6 x 6.4 cm without evidence of focal masses.  The endometrium measuring 24.1 mm (thickened) and heterogeneous.  Right and left ovaries not visualized.  No adnexal masses identified.  No free fluid in cul-de-sac.  Patient was seen by Jacqueline Yoder in clinic on 11/13/2018.  Cervical exam reveals 2 cm exophytic irregular mass consistent with malignancy.   11/19/2018 CT chest abdomen pelvis with contrast showed thickened endometrium with some irregularity compatible with the provided diagnosis of endometrial malignancy.  There is a mildly prominent left inguinal node 1.4 cm.  Patient was seen by Jacqueline Yoder on 11/20/2018 and left groin lymph node biopsy was recommended.  11/26/2018 patient underwent left inguinal lymph node biopsy. Pathology showed metastatic adenocarcinoma consistent  with colorectal origin.  CDX 2+.  Case was discussed on tumor board.  Recommend colonoscopy for further evaluation.  Patient reports significant weight loss 30 pounds over the last year.  Chronic vaginal spotting. Change of bowel habits the past few months.  More constipated.  Family history positive for brother who has colon cancer prostate cancer.  patient has underwent colonoscopy on 12/03/2018 which reviewed a nonobstructing large mass in the rectum.  Also chronic fistula.  Mass was not circumferential.  This was biopsied with a cold forceps for histology.  Pathology came back hyperplastic polyp negative for dysplasia and malignancy. [name], patient underwent flex sigmoidoscopy on 12/06/2018 with rebiopsy of the rectal mass. This time biopsy results came back positive for invasive colorectal adenocarcinoma, moderately differentiated. Immunotherapy for nearly mismatch repair protein (MMR ) was performed.  There is no loss of MMR expression.  low probability of MSI high.   # Seen by Jacqueline Yoder surgery for evaluation of resectability for rectal cancer. In addition, she also had a second opinion with Jacqueline Yoder pathology where her endometrial biopsy pathology was changed to  adenocarcinoma, consistent with colorectal primary.   [name]. She has home health that has been assisting with ostomy care  Patient was also evaluated by Jacqueline Yoder oncology.  Recommendation is to proceed with TNT with concurrent chemoradiation followed by neoadjuvant chemotherapy followed by surgical resection. Patient prefers to have treatment done locally with [facility].   # Oncology Treatment:  02/03/2019- 03/19/2019  concurrent Xeloda and radiation.  Xeloda dose 825mg /m2 BID - rounded to 1650mg BID- on days of radiation. 04/09/2019, started on FOLFOX with bolus early.  Omitted.  07/16/2019 finished 8 cycles of FOLFOX. 09/17/19 APR/posterior vaginectomy/TAH/BSO/VY-flap pT4b pN0  with close vaginal margin 0.2 mm.  Uterus and ovaries   negative for malignancy. Patient reports bilateral lower extremity numbness and tingling, intermittent, left worse than right. She has lost a lot of weight since her APR surgery.   #Family history with half brother having's history of colon cancer prostate cancer.  Personal history of colorectal cancer.  Patient has not decided if she wants genetic testing.    # history of PE( 01/13/2020)  in the bilateral lower extremity DVT (01/13/2020).   She finishes 6 months of anticoagulation with Eliquis 5 mg twice daily. Now switched to Eliquis 2.5 mg twice daily..  # She has now developed recurrent disease. #06/30/20  vaginal introitus mass biopsied. Pathology is consistent with metastatic colorectal adenocarcinoma I have discussed with Jacqueline Yoder surgery  Jacqueline Yoder and the mass is not resectable. Patient has also had colonoscopy by Jacqueline Yoder yesterday. Normal examination. # 07/16/2020 cycle 1 FOLFIRI  # 07/20/2020 PET scan was done for further evaluation, images are consistent with local recurrence, no distant metastasis. #Discussed with radiation oncology Dr. Baruch Gouty will recommends concurrent chemotherapy and radiation. 08/02/2020-08/16/2020, patient starts radiation.  Xeloda was held due to neutropenia 08/17/2020,-09/06/2020 Xeloda 1500 mg twice daily concurrently with radiation  01/31/21 started on FOLFIRI + Bev   INTERVAL HISTORY Jacqueline Yoder is a 75 y.o. female who has above history reviewed by me presents for follow-up of rectal cancer. She tolerates chemotherapy well.  She denies nausea vomiting. She has had intermittent loose bowel movements, patient has a colostomy bag.  She takes Imodium as needed. Lost 1 pound since last visit.   Review of Systems  Constitutional:  Negative for appetite change, chills, fatigue, fever and unexpected weight change.  HENT:   Negative for hearing loss and voice change.   Eyes:  Negative for eye problems.   Respiratory:  Negative for chest tightness and cough.   Cardiovascular:  Negative for chest pain.  Gastrointestinal:  Positive for diarrhea. Negative for abdominal distention, abdominal pain, blood in stool, constipation and nausea.  Endocrine: Negative for hot flashes.  Genitourinary:  Negative for difficulty urinating and frequency.   Musculoskeletal:  Negative for arthralgias.  Skin:  Negative for itching and rash.  Neurological:  Negative for extremity weakness and numbness.  Hematological:  Negative for adenopathy.  Psychiatric/Behavioral:  Negative for confusion.    MEDICAL HISTORY:  Past Medical History:  Diagnosis Date   Allergy    Arthritis    Blood clot in vein    Family history of colon cancer    GERD (gastroesophageal reflux disease)    Hypercholesteremia    Hypertension    Hypertension    Lower extremity edema    Rectal cancer ([place]) 12/2018   Urinary incontinence     SURGICAL HISTORY: Past Surgical History:  Procedure Laterality Date   ABDOMINAL HYSTERECTOMY     CHOLECYSTECTOMY  1971   COLONOSCOPY WITH PROPOFOL N/A 12/03/2018   Procedure: COLONOSCOPY WITH PROPOFOL;  Surgeon: Lucilla Lame, MD;  Location: [facility] ENDOSCOPY;  Service: Endoscopy;  Laterality: N/A;   COLONOSCOPY WITH PROPOFOL N/A 07/15/2020   Procedure: COLONOSCOPY WITH PROPOFOL;  Surgeon: [name], MD;  Location: [facility] ENDOSCOPY;  Service: Gastroenterology;  Laterality: N/A;   FLEXIBLE SIGMOIDOSCOPY N/A 12/06/2018   Procedure: FLEXIBLE SIGMOIDOSCOPY;  Surgeon: [name], MD;  Location: [facility] ENDOSCOPY;  Service: Endoscopy;  Laterality: N/A;   LAPAROSCOPIC COLOSTOMY  01/06/2019   PORTACATH PLACEMENT N/A 04/03/2019   Procedure: INSERTION PORT-A-CATH;  Surgeon: Jules Husbands, MD;  Location: [facility] ORS;  Service: General;  Laterality: N/A;  SOCIAL HISTORY: Social History   Socioeconomic History   Marital status: Widowed    Spouse name: Not on file   Number of children: Not on file   Years of  education: Not on file   Highest education level: Not on file  Occupational History   Not on file  Tobacco Use   Smoking status: Former    Types: Cigarettes    Quit date: 12/02/1977    Years since quitting: 43.5   Smokeless tobacco: Former  Scientific laboratory technician Use: Never used  Substance and Sexual Activity   Alcohol use: Never   Drug use: Never   Sexual activity: Not Currently    Birth control/protection: None  Other Topics Concern   Not on file  Social History Narrative   Lives with daughter   Social Determinants of Health   Financial Resource Strain: Not on file  Food Insecurity: Not on file  Transportation Needs: Not on file  Physical Activity: Not on file  Stress: Not on file  Social Connections: Not on file  Intimate Partner Violence: Not on file    FAMILY HISTORY: Family History  Problem Relation Age of Onset   Colon cancer Brother 53       exposure to chemicals [place]   Hypertension Mother    Stroke Mother    Kidney failure Father    Breast cancer Neg Hx    Ovarian cancer Neg Hx     ALLERGIES:  is allergic to sulfamethoxazole-trimethoprim.  MEDICATIONS:  Current Outpatient Medications  Medication Sig Dispense Refill   apixaban (ELIQUIS) 2.5 MG TABS tablet Take 1 tablet (2.5 mg total) by mouth 2 (two) times daily. 180 tablet 1   Cholecalciferol (VITAMIN D3) 2000 units capsule Take 2,000 Units by mouth daily.     diclofenac sodium (VOLTAREN) 1 % GEL Apply 2 g topically 4 (four) times daily as needed (joint pain).  11   fluticasone (FLONASE) 50 MCG/ACT nasal spray Place 1 spray into both nostrils daily. 16 g 1   gabapentin (NEURONTIN) 100 MG capsule Take 1 capsule (100 mg total) by mouth at bedtime. 30 capsule 0   ipratropium (ATROVENT) 0.03 % nasal spray Place 1 spray into both nostrils 2 (two) times daily. 30 mL 2   lidocaine-prilocaine (EMLA) cream Apply 1 application topically as needed. 30 g 6   loperamide (IMODIUM) 2 MG capsule Take 1 capsule (2 mg  total) by mouth See admin instructions. With onset of loose stool, take $RemoveBef'4mg'mvYWoURSNS$  followed by $RemoveBef'2mg'XghcjqAHeb$  every 2 hours,  Maximum: 16 mg/day 120 capsule 1   loratadine (CLARITIN) 10 MG tablet Take 10 mg by mouth daily.     Multiple Vitamins-Minerals (ONE-A-DAY WOMENS 50 PLUS PO) Take 1 tablet by mouth daily.      potassium chloride SA (KLOR-CON) 20 MEQ tablet Take 1 tablet (20 mEq total) by mouth 2 (two) times daily. 90 tablet 3   simvastatin (ZOCOR) 40 MG tablet Take 1 tablet (40 mg total) by mouth at bedtime. 90 tablet 3   triamterene-hydrochlorothiazide (DYAZIDE) 37.5-25 MG capsule Take 1 each (1 capsule total) by mouth daily. 90 capsule 3   zinc gluconate 50 MG tablet Take 50 mg by mouth daily.     diphenoxylate-atropine (LOMOTIL) 2.5-0.025 MG tablet Take 1 tablet by mouth 4 (four) times daily as needed for diarrhea or loose stools. 30 tablet 1   ondansetron (ZOFRAN) 8 MG tablet Take 1 tablet (8 mg total) by mouth 2 (two) times daily as needed for  refractory nausea / vomiting. Start on day 3 after chemotherapy. (Patient not taking: Reported on 06/21/2021) 30 tablet 1   prochlorperazine (COMPAZINE) 10 MG tablet Take 1 tablet (10 mg total) by mouth every 6 (six) hours as needed (NAUSEA). (Patient not taking: Reported on 06/21/2021) 30 tablet 1   No current facility-administered medications for this visit.   Facility-Administered Medications Ordered in Other Visits  Medication Dose Route Frequency Provider Last Rate Last Admin   fluorouracil (ADRUCIL) 4,500 mg in sodium chloride 0.9 % 60 mL chemo infusion  2,400 mg/m2 (Treatment Plan Recorded) Intravenous 1 day or 1 dose Earlie Server, MD   4,500 mg at 06/21/21 1445     PHYSICAL EXAMINATION: ECOG PERFORMANCE STATUS: 1 - Symptomatic but completely ambulatory  Physical Exam Constitutional:      General: She is not in acute distress. HENT:     Head: Normocephalic and atraumatic.  Eyes:     General: No scleral icterus. Cardiovascular:     Rate and Rhythm: Normal  rate and regular rhythm.     Heart sounds: Normal heart sounds.  Pulmonary:     Effort: Pulmonary effort is normal. No respiratory distress.     Breath sounds: No wheezing.  Abdominal:     General: Bowel sounds are normal. There is no distension.     Palpations: Abdomen is soft.     Comments: + Colostomy bag   Musculoskeletal:        General: No deformity. Normal range of motion.     Cervical back: Normal range of motion and neck supple.  Skin:    General: Skin is warm and dry.     Findings: No erythema or rash.  Neurological:     Mental Status: She is alert and oriented to person, place, and time. Mental status is at baseline.     Cranial Nerves: No cranial nerve deficit.     Coordination: Coordination normal.   CMP Latest Ref Rng & Units 06/21/2021  Glucose 70 - 99 mg/dL 104(H)  BUN 8 - 23 mg/dL 9  Creatinine 0.44 - 1.00 mg/dL 0.86  Sodium 135 - 145 mmol/L 137  Potassium 3.5 - 5.1 mmol/L 3.6  Chloride 98 - 111 mmol/L 101  CO2 22 - 32 mmol/L 26  Calcium 8.9 - 10.3 mg/dL 9.2  Total Protein 6.5 - 8.1 g/dL 7.1  Total Bilirubin 0.3 - 1.2 mg/dL 0.2(L)  Alkaline Phos 38 - 126 U/L 87  AST 15 - 41 U/L 27  ALT 0 - 44 U/L 27   CBC Latest Ref Rng & Units 06/21/2021  WBC 4.0 - 10.5 K/uL 6.6  Hemoglobin 12.0 - 15.0 g/dL 11.7(L)  Hematocrit 36.0 - 46.0 % 37.3  Platelets 150 - 400 K/uL 208    LABORATORY DATA:  I have reviewed the data as listed Lab Results  Component Value Date   WBC 6.6 06/21/2021   HGB 11.7 (L) 06/21/2021   HCT 37.3 06/21/2021   MCV 99.5 06/21/2021   PLT 208 06/21/2021   Recent Labs    06/23/20 1124 07/16/20 0802 07/23/20 1345 05/24/21 0800 06/07/21 0827 06/21/21 1034  NA 140 140   < > 137 135 137  K 3.7 3.2*   < > 3.4* 3.2* 3.6  CL 100 101   < > 100 99 101  CO2 30 29   < > $R'29 27 26  'wM$ GLUCOSE 114* 114*   < > 101* 101* 104*  BUN 10 9   < > 10 8 9  CREATININE 0.97 0.87   < > 1.04* 0.97 0.86  CALCIUM 9.1 9.2   < > 9.4 9.3 9.2  GFRNONAA 58* >60   < >  56* >60 >60  GFRAA >60 >60  --   --   --   --   PROT 7.0 6.9   < > 7.2 6.9 7.1  ALBUMIN 4.0 3.6   < > 4.4 4.1 4.1  AST 25 24   < > $R'29 28 27  'uA$ ALT 15 14   < > $R'15 20 27  'fU$ ALKPHOS 41 41   < > 70 79 87  BILITOT 0.4 0.5   < > 0.4 0.8 0.2*   < > = values in this interval not displayed.    Iron/TIBC/Ferritin/ %Sat    Component Value Date/Time   IRON 40 06/23/2020 1124   TIBC 246 (L) 06/23/2020 1124   FERRITIN 338 (H) 06/23/2020 1124   IRONPCTSAT 16 06/23/2020 1124     RADIOGRAPHIC STUDIES: I have personally reviewed the radiological images as listed and agreed with the findings in the report. CT CHEST ABDOMEN PELVIS W CONTRAST  Result Date: 05/19/2021 CLINICAL DATA:  Rectal cancer restaging, vaginal recurrence, ongoing chemotherapy EXAM: CT CHEST, ABDOMEN, AND PELVIS WITH CONTRAST TECHNIQUE: Multidetector CT imaging of the chest, abdomen and pelvis was performed following the standard protocol during bolus administration of intravenous contrast. CONTRAST:  144mL OMNIPAQUE IOHEXOL 300 MG/ML SOLN, additional oral enteric contrast COMPARISON:  01/19/2021 FINDINGS: CT CHEST FINDINGS Cardiovascular: Right chest port catheter. Aortic atherosclerosis. Normal heart size. Scattered left and right coronary artery calcifications enlargement of the main pulmonary artery measuring up to 3.7 cm in caliber. No pericardial effusion. Mediastinum/Nodes: No enlarged mediastinal, hilar, or axillary lymph nodes. Thyroid gland, trachea, and esophagus demonstrate no significant findings. Lungs/Pleura: Minimal, bandlike scarring of the bilateral lung bases. Interval decrease in size of multiple small bilateral pulmonary nodules, for example a 5 mm nodule of the medial segment right middle lobe, previously 8 mm (series 4, image 97) and a 2 mm nodule of the posterior right upper lobe, previously 4 mm (series 4, image 48). No pleural effusion or pneumothorax. Musculoskeletal: No chest wall mass or suspicious bone lesions  identified. CT ABDOMEN PELVIS FINDINGS Hepatobiliary: No focal liver abnormality is seen. Status post cholecystectomy. Postoperative biliary dilatation. Pancreas: Unremarkable. No pancreatic ductal dilatation or surrounding inflammatory changes. Spleen: Normal in size without significant abnormality. Adrenals/Urinary Tract: Adrenal glands are unremarkable. Multiple bilateral renal cysts. Kidneys are otherwise normal, without renal calculi, solid lesion, or hydronephrosis. Bladder is unremarkable. Stomach/Bowel: Stomach is within normal limits. Appendix appears normal. No evidence of bowel wall thickening, distention, or inflammatory changes. Redemonstrated postoperative findings of abdominoperineal resection with left lower quadrant end colostomy. Diverticulosis of the remnant sigmoid. Vascular/Lymphatic: Aortic atherosclerosis. Previously noted enlargement of bilateral inguinal lymph nodes is resolved, left inguinal node measuring 1.5 x 0.8 cm, previously 2.5 x 1.4 cm (series 2, image 105). Reproductive: Status post hysterectomy. Some appearance of vaginal soft tissue thickening, in keeping with report of vaginal recurrence (series 2, image 114). Other: No abdominal wall hernia or abnormality. No abdominopelvic ascites. Musculoskeletal: No acute or significant osseous findings. IMPRESSION: 1. Redemonstrated postoperative findings of abdominoperineal resection with left lower quadrant end colostomy. 2. Some appearance of vaginal soft tissue thickening, in keeping with report of vaginal recurrence of rectal malignancy. This would be better assessed by MRI and physical examination. 3. Previously noted enlargement of bilateral inguinal lymph nodes is resolved, consistent with treatment response of  nodal metastatic disease. 4. Interval decrease in size of multiple small bilateral pulmonary nodules, consistent with treatment response of pulmonary metastatic disease. 5. No evidence of new metastatic disease in the chest,  abdomen, or pelvis. 6. Coronary artery disease. Aortic Atherosclerosis (ICD10-I70.0). Electronically Signed   By: Eddie Candle M.Yoder.   On: 05/19/2021 10:40      CT CHEST ABDOMEN PELVIS W CONTRAST  Result Date: 05/19/2021 CLINICAL DATA:  Rectal cancer restaging, vaginal recurrence, ongoing chemotherapy EXAM: CT CHEST, ABDOMEN, AND PELVIS WITH CONTRAST TECHNIQUE: Multidetector CT imaging of the chest, abdomen and pelvis was performed following the standard protocol during bolus administration of intravenous contrast. CONTRAST:  181mL OMNIPAQUE IOHEXOL 300 MG/ML SOLN, additional oral enteric contrast COMPARISON:  01/19/2021 FINDINGS: CT CHEST FINDINGS Cardiovascular: Right chest port catheter. Aortic atherosclerosis. Normal heart size. Scattered left and right coronary artery calcifications enlargement of the main pulmonary artery measuring up to 3.7 cm in caliber. No pericardial effusion. Mediastinum/Nodes: No enlarged mediastinal, hilar, or axillary lymph nodes. Thyroid gland, trachea, and esophagus demonstrate no significant findings. Lungs/Pleura: Minimal, bandlike scarring of the bilateral lung bases. Interval decrease in size of multiple small bilateral pulmonary nodules, for example a 5 mm nodule of the medial segment right middle lobe, previously 8 mm (series 4, image 97) and a 2 mm nodule of the posterior right upper lobe, previously 4 mm (series 4, image 48). No pleural effusion or pneumothorax. Musculoskeletal: No chest wall mass or suspicious bone lesions identified. CT ABDOMEN PELVIS FINDINGS Hepatobiliary: No focal liver abnormality is seen. Status post cholecystectomy. Postoperative biliary dilatation. Pancreas: Unremarkable. No pancreatic ductal dilatation or surrounding inflammatory changes. Spleen: Normal in size without significant abnormality. Adrenals/Urinary Tract: Adrenal glands are unremarkable. Multiple bilateral renal cysts. Kidneys are otherwise normal, without renal calculi, solid  lesion, or hydronephrosis. Bladder is unremarkable. Stomach/Bowel: Stomach is within normal limits. Appendix appears normal. No evidence of bowel wall thickening, distention, or inflammatory changes. Redemonstrated postoperative findings of abdominoperineal resection with left lower quadrant end colostomy. Diverticulosis of the remnant sigmoid. Vascular/Lymphatic: Aortic atherosclerosis. Previously noted enlargement of bilateral inguinal lymph nodes is resolved, left inguinal node measuring 1.5 x 0.8 cm, previously 2.5 x 1.4 cm (series 2, image 105). Reproductive: Status post hysterectomy. Some appearance of vaginal soft tissue thickening, in keeping with report of vaginal recurrence (series 2, image 114). Other: No abdominal wall hernia or abnormality. No abdominopelvic ascites. Musculoskeletal: No acute or significant osseous findings. IMPRESSION: 1. Redemonstrated postoperative findings of abdominoperineal resection with left lower quadrant end colostomy. 2. Some appearance of vaginal soft tissue thickening, in keeping with report of vaginal recurrence of rectal malignancy. This would be better assessed by MRI and physical examination. 3. Previously noted enlargement of bilateral inguinal lymph nodes is resolved, consistent with treatment response of nodal metastatic disease. 4. Interval decrease in size of multiple small bilateral pulmonary nodules, consistent with treatment response of pulmonary metastatic disease. 5. No evidence of new metastatic disease in the chest, abdomen, or pelvis. 6. Coronary artery disease. Aortic Atherosclerosis (ICD10-I70.0). Electronically Signed   By: Eddie Candle M.Yoder.   On: 05/19/2021 10:40     ASSESSMENT & PLAN:  1. Encounter for antineoplastic chemotherapy   2. Metastatic adenocarcinoma ([place])   3. Rectal cancer ([place])   4. History of pulmonary embolism   5. Chemotherapy induced neutropenia ([facility])   6. Hypokalemia   7. Chemotherapy induced diarrhea   Cancer  Staging Rectal cancer [facility]) Staging form: Colon and Rectum, AJCC 8th Edition - Clinical stage  from 01/23/2019: Stage IIIC (cT4b, cN1a, cM0) - Signed by Earlie Server, MD on 01/23/2019 - Pathologic stage from 10/06/2019: Stage IIC (ypT4b, pN0, cM0) - Signed by Earlie Server, MD on 10/06/2019 - Pathologic: Stage Unknown (rpTX, pNX, cM1) - Signed by Earlie Server, MD on 01/31/2021  #History of stage IIIC Rectal cancer, s/p TNT, followed by 09/17/19 APR/posterior vaginectomy/TAH/BSO/VY-flap, pT4b pN0 with close vaginal margin 0.2 mm.  Uterus and ovaries negative for malignancy. palliative radiation to vaginal recurrence.  Finished 09/04/2020 01/19/21 recurrence with lung metastasis.-Palliative chemotherapy CEA has decreased.  Labs reviewed and discussed with patient.  Proceed with FOLFIRI as bevacizumab today. -Neulasta on Day 3, recommend patient to take Claritin for 4 days.   # Post treatment hives, likely due to chemotherapy. She gets additional one dose of IV benadryl $RemoveBef'25mg'[id number]$  x 1 if needed..  Current  oral benadryl as instructed PRN hive at home.  Symptoms are stable.  #Chemotherapy-induced neutropenia, see above.  On GCSF  #Chemotherapy-induced diarrhea, symptoms are stable.  Continue as needed continue Imodium as needed..  She feels that Imodium relieves symptoms partially..  I sent a prescription of Lomotil in case Imodium does not work for her.  #Hypokalemia, potassium 3.6, continue potassium chloride 20 mEq twice daily  #History of pulmonary embolism, history of bilateral DVT Continue Eliquis 2.5 mg twice daily for anticoagulation prophylaxis.   Follow-up 2 weeks for next cycle of treatment.  Earlie Server, MD, PhD

## 2021-06-21 NOTE — Progress Notes (Signed)
Pt in for follow up, states has had diarrhea since Thursday, at least 3 loose stools daily.  Pt has colostomy. States has good appetite "but everything goes right through me".  Pt reports drinking 2-3 ensure daily along with pedialyte.  Pt using Imodium.

## 2021-06-21 NOTE — Progress Notes (Signed)
25 mg IV Benadryl given post irinotecan and leucovorin infusion due to patient breaking out in hives. Escorted out via wheelchair. Stable at discharge

## 2021-06-21 NOTE — Progress Notes (Signed)
RX fo lomotil sent.   Faythe Casa, NP 06/21/2021 11:20 AM

## 2021-06-21 NOTE — Patient Instructions (Signed)
[facility]  Discharge Instructions: Thank you for choosing [facility] to provide your oncology and hematology care.  If you have a lab appointment with the [facility], please go directly to the [facility] and check in at the registration area.  Wear comfortable clothing and clothing appropriate for easy access to any Portacath or PICC line.   We strive to give you quality time with your provider. You may need to reschedule your appointment if you arrive late (15 or more minutes).  Arriving late affects you and other patients whose appointments are after yours.  Also, if you miss three or more appointments without notifying the office, you may be dismissed from the clinic at the provider's discretion.      For prescription refill requests, have your pharmacy contact our office and allow 72 hours for refills to be completed.    Today you received the following chemotherapy and/or immunotherapy agents FOLFIRI The chemotherapy medication bag should finish at 46 hours, 96 hours, or 7 days. For example, if your pump is scheduled for 46 hours and it was put on at 4:00 p.m., it should finish at 2:00 p.m. the day it is scheduled to come off regardless of your appointment time.     Estimated time to finish at 12:45pm.   If the display on your pump reads "Low Volume" and it is beeping, take the batteries out of the pump and come to the cancer center for it to be taken off.   If the pump alarms go off prior to the pump reading "Low Volume" then call [phone] and someone can assist you.  If the plunger comes out and the chemotherapy medication is leaking out, please use your home chemo spill kit to clean up the spill. Do NOT use paper towels or other household products.  If you have problems or questions regarding your pump, please call either [phone] (24 hours a day) or the cancer center Monday-Friday 8:00 a.m.- 4:30 p.m. at the clinic  number and we will assist you. If you are unable to get assistance, then go to the nearest Emergency Department and ask the staff to contact the IV team for assistance.        To help prevent nausea and vomiting after your treatment, we encourage you to take your nausea medication as directed.  BELOW ARE SYMPTOMS THAT SHOULD BE REPORTED IMMEDIATELY: *FEVER GREATER THAN 100.4 F (38 C) OR HIGHER *CHILLS OR SWEATING *NAUSEA AND VOMITING THAT IS NOT CONTROLLED WITH YOUR NAUSEA MEDICATION *UNUSUAL SHORTNESS OF BREATH *UNUSUAL BRUISING OR BLEEDING *URINARY PROBLEMS (pain or burning when urinating, or frequent urination) *BOWEL PROBLEMS (unusual diarrhea, constipation, pain near the anus) TENDERNESS IN MOUTH AND THROAT WITH OR WITHOUT PRESENCE OF ULCERS (sore throat, sores in mouth, or a toothache) UNUSUAL RASH, SWELLING OR PAIN  UNUSUAL VAGINAL DISCHARGE OR ITCHING   Items with * indicate a potential emergency and should be followed up as soon as possible or go to the Emergency Department if any problems should occur.  Please show the CHEMOTHERAPY ALERT CARD or IMMUNOTHERAPY ALERT CARD at check-in to the Emergency Department and triage nurse.  Should you have questions after your visit or need to cancel or reschedule your appointment, please contact [facility]  [phone] and follow the prompts.  Office hours are 8:00 a.m. to 4:30 p.m. Monday - Friday. Please note that voicemails left after 4:00 p.m. may not be returned until the  following business day.  We are closed weekends and major holidays. You have access to a nurse at all times for urgent questions. Please call the main number to the clinic [phone] and follow the prompts.  For any non-urgent questions, you may also contact your provider using MyChart. We now offer e-Visits for anyone 12 and older to request care online for non-urgent symptoms. For details visit [email or web address].   Also  download the MyChart app! Go to the app store, search "MyChart", open the app, select [facility], and log in with your MyChart username and password.  Due to Covid, a mask is required upon entering the hospital/clinic. If you do not have a mask, one will be given to you upon arrival. For doctor visits, patients may have 1 support person aged 69 or older with them. For treatment visits, patients cannot have anyone with them due to current Covid guidelines and our immunocompromised population. Fluorouracil, 5-FU injection What is this medication? FLUOROURACIL, 5-FU (flure oh YOOR a sil) is a chemotherapy drug. It slows the growth of cancer cells. This medicine is used to treat many types of cancer like breast cancer, colon or rectal cancer, pancreatic cancer, and stomach cancer. This medicine may be used for other purposes; ask your health care provider or pharmacist if you have questions. COMMON BRAND NAME(S): Adrucil What should I tell my care team before I take this medication? They need to know if you have any of these conditions: blood disorders dihydropyrimidine dehydrogenase (DPD) deficiency infection (especially a virus infection such as chickenpox, cold sores, or herpes) kidney disease liver disease malnourished, poor nutrition recent or ongoing radiation therapy an unusual or allergic reaction to fluorouracil, other chemotherapy, other medicines, foods, dyes, or preservatives pregnant or trying to get pregnant breast-feeding How should I use this medication? This drug is given as an infusion or injection into a vein. It is administered in a hospital or clinic by a specially trained health care professional. Talk to your pediatrician regarding the use of this medicine in children. Special care may be needed. Overdosage: If you think you have taken too much of this medicine contact a poison control center or emergency room at once. NOTE: This medicine is only for you. Do not share this  medicine with others. What if I miss a dose? It is important not to miss your dose. Call your doctor or health care professional if you are unable to keep an appointment. What may interact with this medication? Do not take this medicine with any of the following medications: live virus vaccines This medicine may also interact with the following medications: medicines that treat or prevent blood clots like warfarin, enoxaparin, and dalteparin This list may not describe all possible interactions. Give your health care provider a list of all the medicines, herbs, non-prescription drugs, or dietary supplements you use. Also tell them if you smoke, drink alcohol, or use illegal drugs. Some items may interact with your medicine. What should I watch for while using this medication? Visit your doctor for checks on your progress. This drug may make you feel generally unwell. This is not uncommon, as chemotherapy can affect healthy cells as well as cancer cells. Report any side effects. Continue your course of treatment even though you feel ill unless your doctor tells you to stop. In some cases, you may be given additional medicines to help with side effects. Follow all directions for their use. Call your doctor or health care professional for advice if you get  a fever, chills or sore throat, or other symptoms of a cold or flu. Do not treat yourself. This drug decreases your body's ability to fight infections. Try to avoid being around people who are sick. This medicine may increase your risk to bruise or bleed. Call your doctor or health care professional if you notice any unusual bleeding. Be careful brushing and flossing your teeth or using a toothpick because you may get an infection or bleed more easily. If you have any dental work done, tell your dentist you are receiving this medicine. Avoid taking products that contain aspirin, acetaminophen, ibuprofen, naproxen, or ketoprofen unless instructed by your  doctor. These medicines may hide a fever. Do not become pregnant while taking this medicine. Women should inform their doctor if they wish to become pregnant or think they might be pregnant. There is a potential for serious side effects to an unborn child. Talk to your health care professional or pharmacist for more information. Do not breast-feed an infant while taking this medicine. Men should inform their doctor if they wish to father a child. This medicine may lower sperm counts. Do not treat diarrhea with over the counter products. Contact your doctor if you have diarrhea that lasts more than 2 days or if it is severe and watery. This medicine can make you more sensitive to the sun. Keep out of the sun. If you cannot avoid being in the sun, wear protective clothing and use sunscreen. Do not use sun lamps or tanning beds/booths. What side effects may I notice from receiving this medication? Side effects that you should report to your doctor or health care professional as soon as possible: allergic reactions like skin rash, itching or hives, swelling of the face, lips, or tongue low blood counts - this medicine may decrease the number of white blood cells, red blood cells and platelets. You may be at increased risk for infections and bleeding. signs of infection - fever or chills, cough, sore throat, pain or difficulty passing urine signs of decreased platelets or bleeding - bruising, pinpoint red spots on the skin, black, tarry stools, blood in the urine signs of decreased red blood cells - unusually weak or tired, fainting spells, lightheadedness breathing problems changes in vision chest pain mouth sores nausea and vomiting pain, swelling, redness at site where injected pain, tingling, numbness in the hands or feet redness, swelling, or sores on hands or feet stomach pain unusual bleeding Side effects that usually do not require medical attention (report to your doctor or health care  professional if they continue or are bothersome): changes in finger or toe nails diarrhea dry or itchy skin hair loss headache loss of appetite sensitivity of eyes to the light stomach upset unusually teary eyes This list may not describe all possible side effects. Call your doctor for medical advice about side effects. You may report side effects to FDA at 1-800-FDA-1088. Where should I keep my medication? This drug is given in a hospital or clinic and will not be stored at home. NOTE: This sheet is a summary. It may not cover all possible information. If you have questions about this medicine, talk to your doctor, pharmacist, or health care provider.  2022 Elsevier/Gold Standard (2019-09-02 15:00:03) Leucovorin injection What is this medication? LEUCOVORIN (loo koe VOR in) is used to prevent or treat the harmful effects of some medicines. This medicine is used to treat anemia caused by a low amount of folic acid in the body. It is also used  with 5-fluorouracil (5-FU) to treat colon cancer. This medicine may be used for other purposes; ask your health care provider or pharmacist if you have questions. What should I tell my care team before I take this medication? They need to know if you have any of these conditions: anemia from low levels of vitamin B-12 in the blood an unusual or allergic reaction to leucovorin, folic acid, other medicines, foods, dyes, or preservatives pregnant or trying to get pregnant breast-feeding How should I use this medication? This medicine is for injection into a muscle or into a vein. It is given by a health care professional in a hospital or clinic setting. Talk to your pediatrician regarding the use of this medicine in children. Special care may be needed. Overdosage: If you think you have taken too much of this medicine contact a poison control center or emergency room at once. NOTE: This medicine is only for you. Do not share this medicine with  others. What if I miss a dose? This does not apply. What may interact with this medication? capecitabine fluorouracil phenobarbital phenytoin primidone trimethoprim-sulfamethoxazole This list may not describe all possible interactions. Give your health care provider a list of all the medicines, herbs, non-prescription drugs, or dietary supplements you use. Also tell them if you smoke, drink alcohol, or use illegal drugs. Some items may interact with your medicine. What should I watch for while using this medication? Your condition will be monitored carefully while you are receiving this medicine. This medicine may increase the side effects of 5-fluorouracil, 5-FU. Tell your doctor or health care professional if you have diarrhea or mouth sores that do not get better or that get worse. What side effects may I notice from receiving this medication? Side effects that you should report to your doctor or health care professional as soon as possible: allergic reactions like skin rash, itching or hives, swelling of the face, lips, or tongue breathing problems fever, infection mouth sores unusual bleeding or bruising unusually weak or tired Side effects that usually do not require medical attention (report to your doctor or health care professional if they continue or are bothersome): constipation or diarrhea loss of appetite nausea, vomiting This list may not describe all possible side effects. Call your doctor for medical advice about side effects. You may report side effects to FDA at 1-800-FDA-1088. Where should I keep my medication? This drug is given in a hospital or clinic and will not be stored at home. NOTE: This sheet is a summary. It may not cover all possible information. If you have questions about this medicine, talk to your doctor, pharmacist, or health care provider.  2022 Elsevier/Gold Standard (2008-04-07 16:50:29) Irinotecan injection What is this medication? IRINOTECAN  (ir in oh TEE kan ) is a chemotherapy drug. It is used to treat colon and rectal cancer. This medicine may be used for other purposes; ask your health care provider or pharmacist if you have questions. COMMON BRAND NAME(S): Camptosar What should I tell my care team before I take this medication? They need to know if you have any of these conditions: dehydration diarrhea infection (especially a virus infection such as chickenpox, cold sores, or herpes) liver disease low blood counts, like low white cell, platelet, or red cell counts low levels of calcium, magnesium, or potassium in the blood recent or ongoing radiation therapy an unusual or allergic reaction to irinotecan, other medicines, foods, dyes, or preservatives pregnant or trying to get pregnant breast-feeding How should I use  this medication? This drug is given as an infusion into a vein. It is administered in a hospital or clinic by a specially trained health care professional. Talk to your pediatrician regarding the use of this medicine in children. Special care may be needed. Overdosage: If you think you have taken too much of this medicine contact a poison control center or emergency room at once. NOTE: This medicine is only for you. Do not share this medicine with others. What if I miss a dose? It is important not to miss your dose. Call your doctor or health care professional if you are unable to keep an appointment. What may interact with this medication? Do not take this medicine with any of the following medications: cobicistat itraconazole This medicine may interact with the following medications: antiviral medicines for HIV or AIDS certain antibiotics like rifampin or rifabutin certain medicines for fungal infections like ketoconazole, posaconazole, and voriconazole certain medicines for seizures like carbamazepine, phenobarbital, phenotoin clarithromycin gemfibrozil nefazodone St. John's Wort This list may not  describe all possible interactions. Give your health care provider a list of all the medicines, herbs, non-prescription drugs, or dietary supplements you use. Also tell them if you smoke, drink alcohol, or use illegal drugs. Some items may interact with your medicine. What should I watch for while using this medication? Your condition will be monitored carefully while you are receiving this medicine. You will need important blood work done while you are taking this medicine. This drug may make you feel generally unwell. This is not uncommon, as chemotherapy can affect healthy cells as well as cancer cells. Report any side effects. Continue your course of treatment even though you feel ill unless your doctor tells you to stop. In some cases, you may be given additional medicines to help with side effects. Follow all directions for their use. You may get drowsy or dizzy. Do not drive, use machinery, or do anything that needs mental alertness until you know how this medicine affects you. Do not stand or sit up quickly, especially if you are an older patient. This reduces the risk of dizzy or fainting spells. Call your health care professional for advice if you get a fever, chills, or sore throat, or other symptoms of a cold or flu. Do not treat yourself. This medicine decreases your body's ability to fight infections. Try to avoid being around people who are sick. Avoid taking products that contain aspirin, acetaminophen, ibuprofen, naproxen, or ketoprofen unless instructed by your doctor. These medicines may hide a fever. This medicine may increase your risk to bruise or bleed. Call your doctor or health care professional if you notice any unusual bleeding. Be careful brushing and flossing your teeth or using a toothpick because you may get an infection or bleed more easily. If you have any dental work done, tell your dentist you are receiving this medicine. Do not become pregnant while taking this medicine  or for 6 months after stopping it. Women should inform their health care professional if they wish to become pregnant or think they might be pregnant. Men should not father a child while taking this medicine and for 3 months after stopping it. There is potential for serious side effects to an unborn child. Talk to your health care professional for more information. Do not breast-feed an infant while taking this medicine or for 7 days after stopping it. This medicine has caused ovarian failure in some women. This medicine may make it more difficult to get pregnant.  Talk to your health care professional if you are concerned about your fertility. This medicine has caused decreased sperm counts in some men. This may make it more difficult to father a child. Talk to your health care professional if you are concerned about your fertility. What side effects may I notice from receiving this medication? Side effects that you should report to your doctor or health care professional as soon as possible: allergic reactions like skin rash, itching or hives, swelling of the face, lips, or tongue chest pain diarrhea flushing, runny nose, sweating during infusion low blood counts - this medicine may decrease the number of white blood cells, red blood cells and platelets. You may be at increased risk for infections and bleeding. nausea, vomiting pain, swelling, warmth in the leg signs of decreased platelets or bleeding - bruising, pinpoint red spots on the skin, black, tarry stools, blood in the urine signs of infection - fever or chills, cough, sore throat, pain or difficulty passing urine signs of decreased red blood cells - unusually weak or tired, fainting spells, lightheadedness Side effects that usually do not require medical attention (report to your doctor or health care professional if they continue or are bothersome): constipation hair loss headache loss of appetite mouth sores stomach pain This list  may not describe all possible side effects. Call your doctor for medical advice about side effects. You may report side effects to FDA at 1-800-FDA-1088. Where should I keep my medication? This drug is given in a hospital or clinic and will not be stored at home. NOTE: This sheet is a summary. It may not cover all possible information. If you have questions about this medicine, talk to your doctor, pharmacist, or health care provider.  2022 Elsevier/Gold Standard (2019-09-02 17:46:13) Bevacizumab injection What is this medication? BEVACIZUMAB (be va SIZ yoo mab) is a monoclonal antibody. It is used to treat many types of cancer. This medicine may be used for other purposes; ask your health care provider or pharmacist if you have questions. COMMON BRAND NAME(S): Avastin, MVASI, Noah Charon What should I tell my care team before I take this medication? They need to know if you have any of these conditions: diabetes heart disease high blood pressure history of coughing up blood prior anthracycline chemotherapy (e.g., doxorubicin, daunorubicin, epirubicin) recent or ongoing radiation therapy recent or planning to have surgery stroke an unusual or allergic reaction to bevacizumab, hamster proteins, mouse proteins, other medicines, foods, dyes, or preservatives pregnant or trying to get pregnant breast-feeding How should I use this medication? This medicine is for infusion into a vein. It is given by a health care professional in a hospital or clinic setting. Talk to your pediatrician regarding the use of this medicine in children. Special care may be needed. Overdosage: If you think you have taken too much of this medicine contact a poison control center or emergency room at once. NOTE: This medicine is only for you. Do not share this medicine with others. What if I miss a dose? It is important not to miss your dose. Call your doctor or health care professional if you are unable to keep an  appointment. What may interact with this medication? Interactions are not expected. This list may not describe all possible interactions. Give your health care provider a list of all the medicines, herbs, non-prescription drugs, or dietary supplements you use. Also tell them if you smoke, drink alcohol, or use illegal drugs. Some items may interact with your medicine. What should I  watch for while using this medication? Your condition will be monitored carefully while you are receiving this medicine. You will need important blood work and urine testing done while you are taking this medicine. This medicine may increase your risk to bruise or bleed. Call your doctor or health care professional if you notice any unusual bleeding. Before having surgery, talk to your health care provider to make sure it is ok. This drug can increase the risk of poor healing of your surgical site or wound. You will need to stop this drug for 28 days before surgery. After surgery, wait at least 28 days before restarting this drug. Make sure the surgical site or wound is healed enough before restarting this drug. Talk to your health care provider if questions. Do not become pregnant while taking this medicine or for 6 months after stopping it. Women should inform their doctor if they wish to become pregnant or think they might be pregnant. There is a potential for serious side effects to an unborn child. Talk to your health care professional or pharmacist for more information. Do not breast-feed an infant while taking this medicine and for 6 months after the last dose. This medicine has caused ovarian failure in some women. This medicine may interfere with the ability to have a child. You should talk to your doctor or health care professional if you are concerned about your fertility. What side effects may I notice from receiving this medication? Side effects that you should report to your doctor or health care professional as  soon as possible: allergic reactions like skin rash, itching or hives, swelling of the face, lips, or tongue chest pain or chest tightness chills coughing up blood high fever seizures severe constipation signs and symptoms of bleeding such as bloody or black, tarry stools; red or dark-brown urine; spitting up blood or brown material that looks like coffee grounds; red spots on the skin; unusual bruising or bleeding from the eye, gums, or nose signs and symptoms of a blood clot such as breathing problems; chest pain; severe, sudden headache; pain, swelling, warmth in the leg signs and symptoms of a stroke like changes in vision; confusion; trouble speaking or understanding; severe headaches; sudden numbness or weakness of the face, arm or leg; trouble walking; dizziness; loss of balance or coordination stomach pain sweating swelling of legs or ankles vomiting weight gain Side effects that usually do not require medical attention (report to your doctor or health care professional if they continue or are bothersome): back pain changes in taste decreased appetite dry skin nausea tiredness This list may not describe all possible side effects. Call your doctor for medical advice about side effects. You may report side effects to FDA at 1-800-FDA-1088. Where should I keep my medication? This drug is given in a hospital or clinic and will not be stored at home. NOTE: This sheet is a summary. It may not cover all possible information. If you have questions about this medicine, talk to your doctor, pharmacist, or health care provider.  2022 Elsevier/Gold Standard (2019-07-30 10:50:46)

## 2021-06-22 ENCOUNTER — Other Ambulatory Visit: Payer: Medicare Other

## 2021-06-22 LAB — CEA: CEA: 419 ng/mL — ABNORMAL HIGH (ref 0.0–4.7)

## 2021-06-23 ENCOUNTER — Other Ambulatory Visit: Payer: Self-pay

## 2021-06-23 ENCOUNTER — Inpatient Hospital Stay: Payer: Medicare Other

## 2021-06-23 VITALS — BP 144/71 | HR 68

## 2021-06-23 DIAGNOSIS — C20 Malignant neoplasm of rectum: Secondary | ICD-10-CM | POA: Diagnosis not present

## 2021-06-23 DIAGNOSIS — K219 Gastro-esophageal reflux disease without esophagitis: Secondary | ICD-10-CM | POA: Diagnosis not present

## 2021-06-23 DIAGNOSIS — C7982 Secondary malignant neoplasm of genital organs: Secondary | ICD-10-CM | POA: Diagnosis not present

## 2021-06-23 DIAGNOSIS — Z7901 Long term (current) use of anticoagulants: Secondary | ICD-10-CM

## 2021-06-23 DIAGNOSIS — D702 Other drug-induced agranulocytosis: Secondary | ICD-10-CM | POA: Diagnosis not present

## 2021-06-23 DIAGNOSIS — E876 Hypokalemia: Secondary | ICD-10-CM | POA: Diagnosis not present

## 2021-06-23 DIAGNOSIS — Z5189 Encounter for other specified aftercare: Secondary | ICD-10-CM | POA: Diagnosis not present

## 2021-06-23 DIAGNOSIS — C7800 Secondary malignant neoplasm of unspecified lung: Secondary | ICD-10-CM | POA: Diagnosis not present

## 2021-06-23 DIAGNOSIS — E7800 Pure hypercholesterolemia, unspecified: Secondary | ICD-10-CM | POA: Diagnosis not present

## 2021-06-23 DIAGNOSIS — Z933 Colostomy status: Secondary | ICD-10-CM | POA: Diagnosis not present

## 2021-06-23 DIAGNOSIS — Z800 Family history of malignant neoplasm of digestive organs: Secondary | ICD-10-CM | POA: Diagnosis not present

## 2021-06-23 DIAGNOSIS — I10 Essential (primary) hypertension: Secondary | ICD-10-CM | POA: Diagnosis not present

## 2021-06-23 DIAGNOSIS — D701 Agranulocytosis secondary to cancer chemotherapy: Secondary | ICD-10-CM

## 2021-06-23 DIAGNOSIS — Z86718 Personal history of other venous thrombosis and embolism: Secondary | ICD-10-CM | POA: Diagnosis not present

## 2021-06-23 DIAGNOSIS — R609 Edema, unspecified: Secondary | ICD-10-CM | POA: Diagnosis not present

## 2021-06-23 DIAGNOSIS — Z5111 Encounter for antineoplastic chemotherapy: Secondary | ICD-10-CM | POA: Diagnosis not present

## 2021-06-23 DIAGNOSIS — Z86711 Personal history of pulmonary embolism: Secondary | ICD-10-CM | POA: Diagnosis not present

## 2021-06-23 DIAGNOSIS — N182 Chronic kidney disease, stage 2 (mild): Secondary | ICD-10-CM

## 2021-06-23 DIAGNOSIS — E782 Mixed hyperlipidemia: Secondary | ICD-10-CM

## 2021-06-23 DIAGNOSIS — Z79899 Other long term (current) drug therapy: Secondary | ICD-10-CM | POA: Diagnosis not present

## 2021-06-23 DIAGNOSIS — R7309 Other abnormal glucose: Secondary | ICD-10-CM

## 2021-06-23 DIAGNOSIS — C799 Secondary malignant neoplasm of unspecified site: Secondary | ICD-10-CM

## 2021-06-23 DIAGNOSIS — R197 Diarrhea, unspecified: Secondary | ICD-10-CM | POA: Diagnosis not present

## 2021-06-23 DIAGNOSIS — I129 Hypertensive chronic kidney disease with stage 1 through stage 4 chronic kidney disease, or unspecified chronic kidney disease: Secondary | ICD-10-CM

## 2021-06-23 DIAGNOSIS — Z923 Personal history of irradiation: Secondary | ICD-10-CM | POA: Diagnosis not present

## 2021-06-23 DIAGNOSIS — T451X5A Adverse effect of antineoplastic and immunosuppressive drugs, initial encounter: Secondary | ICD-10-CM | POA: Diagnosis not present

## 2021-06-23 DIAGNOSIS — Z0000 Encounter for general adult medical examination without abnormal findings: Secondary | ICD-10-CM

## 2021-06-23 DIAGNOSIS — I825Z3 Chronic embolism and thrombosis of unspecified deep veins of distal lower extremity, bilateral: Secondary | ICD-10-CM

## 2021-06-23 MED ORDER — HEPARIN SOD (PORK) LOCK FLUSH 100 UNIT/ML IV SOLN
INTRAVENOUS | Status: AC
  Filled 2021-06-23: qty 5

## 2021-06-23 MED ORDER — SODIUM CHLORIDE 0.9% FLUSH
10.0000 mL | INTRAVENOUS | Status: DC | PRN
  Administered 2021-06-23: 10 mL
  Filled 2021-06-23: qty 10

## 2021-06-23 MED ORDER — PEGFILGRASTIM 6 MG/0.6ML ~~LOC~~ PSKT
6.0000 mg | PREFILLED_SYRINGE | Freq: Once | SUBCUTANEOUS | Status: AC
  Administered 2021-06-23: 6 mg via SUBCUTANEOUS
  Filled 2021-06-23: qty 0.6

## 2021-06-23 MED ORDER — HEPARIN SOD (PORK) LOCK FLUSH 100 UNIT/ML IV SOLN
500.0000 [IU] | Freq: Once | INTRAVENOUS | Status: AC | PRN
  Administered 2021-06-23: 500 [IU]
  Filled 2021-06-23: qty 5

## 2021-06-24 ENCOUNTER — Other Ambulatory Visit: Payer: Self-pay

## 2021-06-24 ENCOUNTER — Other Ambulatory Visit: Payer: Medicare Other

## 2021-06-24 DIAGNOSIS — Z0000 Encounter for general adult medical examination without abnormal findings: Secondary | ICD-10-CM | POA: Diagnosis not present

## 2021-06-24 DIAGNOSIS — I825Z3 Chronic embolism and thrombosis of unspecified deep veins of distal lower extremity, bilateral: Secondary | ICD-10-CM | POA: Diagnosis not present

## 2021-06-24 DIAGNOSIS — E782 Mixed hyperlipidemia: Secondary | ICD-10-CM | POA: Diagnosis not present

## 2021-06-24 DIAGNOSIS — I129 Hypertensive chronic kidney disease with stage 1 through stage 4 chronic kidney disease, or unspecified chronic kidney disease: Secondary | ICD-10-CM | POA: Diagnosis not present

## 2021-06-24 DIAGNOSIS — N182 Chronic kidney disease, stage 2 (mild): Secondary | ICD-10-CM | POA: Diagnosis not present

## 2021-06-24 DIAGNOSIS — R7309 Other abnormal glucose: Secondary | ICD-10-CM | POA: Diagnosis not present

## 2021-06-25 ENCOUNTER — Encounter: Payer: Self-pay | Admitting: Oncology

## 2021-06-25 LAB — COMPLETE METABOLIC PANEL WITH GFR
AG Ratio: 1.8 (calc) (ref 1.0–2.5)
ALT: 20 U/L (ref 6–29)
AST: 24 U/L (ref 10–35)
Albumin: 3.8 g/dL (ref 3.6–5.1)
Alkaline phosphatase (APISO): 68 U/L (ref 37–153)
BUN/Creatinine Ratio: 16 (calc) (ref 6–22)
BUN: 18 mg/dL (ref 7–25)
CO2: 28 mmol/L (ref 20–32)
Calcium: 9.4 mg/dL (ref 8.6–10.4)
Chloride: 104 mmol/L (ref 98–110)
Creat: 1.1 mg/dL — ABNORMAL HIGH (ref 0.60–1.00)
Globulin: 2.1 g/dL (calc) (ref 1.9–3.7)
Glucose, Bld: 84 mg/dL (ref 65–99)
Potassium: 3.9 mmol/L (ref 3.5–5.3)
Sodium: 140 mmol/L (ref 135–146)
Total Bilirubin: 0.5 mg/dL (ref 0.2–1.2)
Total Protein: 5.9 g/dL — ABNORMAL LOW (ref 6.1–8.1)
eGFR: 53 mL/min/{1.73_m2} — ABNORMAL LOW (ref >=60)

## 2021-06-25 LAB — CBC WITH DIFFERENTIAL/PLATELET
Absolute Monocytes: 90 cells/uL — ABNORMAL LOW (ref 200–950)
Basophils Absolute: 20 cells/uL (ref 0–200)
Basophils Relative: 0.9 %
Eosinophils Absolute: 40 cells/uL (ref 15–500)
Eosinophils Relative: 1.8 %
HCT: 36.8 % (ref 35.0–45.0)
Hemoglobin: 11.9 g/dL (ref 11.7–15.5)
Lymphs Abs: 935 cells/uL (ref 850–3900)
MCH: 31.1 pg (ref 27.0–33.0)
MCHC: 32.3 g/dL (ref 32.0–36.0)
MCV: 96.1 fL (ref 80.0–100.0)
MPV: 9.4 fL (ref 7.5–12.5)
Monocytes Relative: 4.1 %
Neutro Abs: 1115 cells/uL — ABNORMAL LOW (ref 1500–7800)
Neutrophils Relative %: 50.7 %
Platelets: 207 10*3/uL (ref 140–400)
RBC: 3.83 10*6/uL (ref 3.80–5.10)
RDW: 15.6 % — ABNORMAL HIGH (ref 11.0–15.0)
Total Lymphocyte: 42.5 %
WBC: 2.2 10*3/uL — ABNORMAL LOW (ref 3.8–10.8)

## 2021-06-25 LAB — THYROID PANEL WITH TSH
Free Thyroxine Index: 4 — ABNORMAL HIGH (ref 1.4–3.8)
T3 Uptake: 26 % (ref 22–35)
T4, Total: 15.5 ug/dL — ABNORMAL HIGH (ref 5.1–11.9)
TSH: 2.35 mIU/L (ref 0.40–4.50)

## 2021-06-25 LAB — LIPID PANEL
Cholesterol: 156 mg/dL (ref <200)
HDL: 61 mg/dL (ref >=50)
LDL Cholesterol (Calc): 77 mg/dL (calc)
Non-HDL Cholesterol (Calc): 95 mg/dL (calc) (ref <130)
Total CHOL/HDL Ratio: 2.6 (calc) (ref <5.0)
Triglycerides: 95 mg/dL (ref <150)

## 2021-06-25 LAB — HEMOGLOBIN A1C
Hgb A1c MFr Bld: 5.3 % of total Hgb (ref <5.7)
Mean Plasma Glucose: 105 mg/dL
eAG (mmol/L): 5.8 mmol/L

## 2021-06-27 ENCOUNTER — Other Ambulatory Visit: Payer: Self-pay

## 2021-06-27 ENCOUNTER — Encounter: Payer: Self-pay | Admitting: Family Medicine

## 2021-06-27 ENCOUNTER — Ambulatory Visit (INDEPENDENT_AMBULATORY_CARE_PROVIDER_SITE_OTHER): Payer: Medicare Other | Admitting: Family Medicine

## 2021-06-27 VITALS — BP 98/45 | HR 72 | Ht 65.0 in | Wt 165.6 lb

## 2021-06-27 DIAGNOSIS — C20 Malignant neoplasm of rectum: Secondary | ICD-10-CM | POA: Diagnosis not present

## 2021-06-27 DIAGNOSIS — Z23 Encounter for immunization: Secondary | ICD-10-CM | POA: Diagnosis not present

## 2021-06-27 DIAGNOSIS — I129 Hypertensive chronic kidney disease with stage 1 through stage 4 chronic kidney disease, or unspecified chronic kidney disease: Secondary | ICD-10-CM | POA: Diagnosis not present

## 2021-06-27 DIAGNOSIS — T451X5A Adverse effect of antineoplastic and immunosuppressive drugs, initial encounter: Secondary | ICD-10-CM

## 2021-06-27 DIAGNOSIS — E782 Mixed hyperlipidemia: Secondary | ICD-10-CM

## 2021-06-27 DIAGNOSIS — Z0000 Encounter for general adult medical examination without abnormal findings: Secondary | ICD-10-CM

## 2021-06-27 DIAGNOSIS — D701 Agranulocytosis secondary to cancer chemotherapy: Secondary | ICD-10-CM

## 2021-06-27 DIAGNOSIS — N182 Chronic kidney disease, stage 2 (mild): Secondary | ICD-10-CM

## 2021-06-27 DIAGNOSIS — I825Z3 Chronic embolism and thrombosis of unspecified deep veins of distal lower extremity, bilateral: Secondary | ICD-10-CM

## 2021-06-27 NOTE — Patient Instructions (Addendum)
Thank you for coming to the office today.  Flu Shot today  Ulceration on Gluteal region appears to be superficial stage 1 - keep using barrier cream / protective layer on it, can use gauze as needed.  Low WBC result, will forward to Dr Tasia Catchings  Please schedule a Follow-up Appointment to: Return in about 6 months (around 12/25/2021) for 6 month follow-up Onc updates, Pressure Sore, HTN.  If you have any other questions or concerns, please feel free to call the office or send a message through [place]. You may also schedule an earlier appointment if necessary.  Additionally, you may be receiving a survey about your experience at our office within a few days to 1 week by e-mail or mail. We value your feedback.  Nobie Putnam, DO [place]

## 2021-06-27 NOTE — Progress Notes (Signed)
Subjective:    Patient ID: Jacqueline Yoder, female    DOB: [birth year], 75 y.o.   MRN: [id number]  Jacqueline Yoder is a 75 y.o. female presenting on 06/27/2021 for Annual Exam   HPI  Here for Annual Physical and lab Review. Accompanied by daughter, Judie Petit.  Oncologist - Dr Earlie Server [facility]) Radiation Oncologist - Dr Noreene Filbert [facility]) GYN - Dani Gobble CNM ([facility])   Metastatic Adenocarcinoma Carcinoma of Colon Spread to Endometrium/Uterine Cancer Initial diagnosis followed since 2019 with postmenopausal vaginal bleeding and spotting, and ultimately led to tissue diagnosis from endometrial biopsy.   S/p12/2/20 APR/posterior vaginectomy / Hysterectomy (total abdominal) /BSO/VY-flap   She did chemo and radiation. And is now on surveillance plan with blood work tumor marker   History of identified some blood clots on CT scanning, with clot in lung with identified PE (12/2019) and bilateral lower extremity (12/2019), she was treated with Eliquis for anticoagulation. Transitioned to Eliquis 2.25m BID.   Last visit 09/02/20 - there was identified recurrent disease.    Currently undergoing Chemotherapy Has had diarrhea and some weight loss also assoc mild low BP  She has worked with nutritionist, and now has better appetite and taking in better nutrition.    Hyperlipidemia Results controlled Currently taking Simvastatin 447mdaily at bedtime.   CHRONIC HTN: Hypokalemia Reports home readings mostly controlled  Current Meds - Triamterene-HCTZ 37.5-25mg daily. SHe is on Potassium Supplement 2086mdaily. Last lab K 3.6 in 10/2020 Reports good compliance, took meds today. Tolerating well, w/o complaints. Denies CP, dyspnea, HA, edema, dizziness / lightheadedness   Additional topic   Chronic DVT / on Chronic Anticoagulation On Eliquis 2.5mg91mily for anticoagulation   Gluteal Pressure Sore Stage 1 / Superficial Previous history Pressure Ulceration, Stage I in  this area. Appears to be similar spot, with gluteal cleft. No ulceration or drainage or bleeding. Using barrier cream with good results.    Health Maintenance: Due high dose flu shot today Defer covid booster for now.   Depression screen PHQ 2/9 09/13/2020  Decreased Interest 0  Down, Depressed, Hopeless 0  PHQ - 2 Score 0  Some recent data might be hidden    Past Medical History:  Diagnosis Date   Allergy    Arthritis    Blood clot in vein    Family history of colon cancer    GERD (gastroesophageal reflux disease)    Hypercholesteremia    Hypertension    Hypertension    Lower extremity edema    Rectal cancer (HCC)[place]/2020   Urinary incontinence    Past Surgical History:  Procedure Laterality Date   ABDOMINAL HYSTERECTOMY     CHOLECYSTECTOMY  1971   COLONOSCOPY WITH PROPOFOL N/A 12/03/2018   Procedure: COLONOSCOPY WITH PROPOFOL;  Surgeon: WohlLucilla Lame;  Location: [facility] ENDOSCOPY;  Service: Endoscopy;  Laterality: N/A;   COLONOSCOPY WITH PROPOFOL N/A 07/15/2020   Procedure: COLONOSCOPY WITH PROPOFOL;  Surgeon: AnnaJonathon Bellows;  [facility];  Service: Gastroenterology;  Laterality: N/A;   FLEXIBLE SIGMOIDOSCOPY N/A 12/06/2018   Procedure: FLEXIBLE SIGMOIDOSCOPY;  Surgeon: AnnaJonathon Bellows;  [facility];  Service: Endoscopy;  Laterality: N/A;   LAPAROSCOPIC COLOSTOMY  01/06/2019   PORTACATH PLACEMENT N/A 04/03/2019   Procedure: INSERTION PORT-A-CATH;  Surgeon: PaboJules Husbands;  Location: [facility] ORS;  Service: General;  Laterality: N/A;   Social History   Socioeconomic History   Marital status: Widowed    Spouse name: Not  on file   Number of children: Not on file   Years of education: Not on file   Highest education level: Not on file  Occupational History   Not on file  Tobacco Use   Smoking status: Former    Types: Cigarettes    Quit date: 12/02/1977    Years since quitting: 43.5   Smokeless tobacco: Former  Scientific laboratory technician Use:  Never used  Substance and Sexual Activity   Alcohol use: Never   Drug use: Never   Sexual activity: Not Currently    Birth control/protection: None  Other Topics Concern   Not on file  Social History Narrative   Lives with daughter   Social Determinants of Health   Financial Resource Strain: Not on file  Food Insecurity: Not on file  Transportation Needs: Not on file  Physical Activity: Not on file  Stress: Not on file  Social Connections: Not on file  Intimate Partner Violence: Not on file   Family History  Problem Relation Age of Onset   Colon cancer Brother 101       exposure to chemicals [place]   Hypertension Mother    Stroke Mother    Kidney failure Father    Breast cancer Neg Hx    Ovarian cancer Neg Hx    Current Outpatient Medications on File Prior to Visit  Medication Sig   apixaban (ELIQUIS) 2.5 MG TABS tablet Take 1 tablet (2.5 mg total) by mouth 2 (two) times daily.   Cholecalciferol (VITAMIN D3) 2000 units capsule Take 2,000 Units by mouth daily.   diclofenac sodium (VOLTAREN) 1 % GEL Apply 2 g topically 4 (four) times daily as needed (joint pain).   diphenoxylate-atropine (LOMOTIL) 2.5-0.025 MG tablet Take 1 tablet by mouth 4 (four) times daily as needed for diarrhea or loose stools.   fluticasone (FLONASE) 50 MCG/ACT nasal spray Place 1 spray into both nostrils daily.   gabapentin (NEURONTIN) 100 MG capsule Take 1 capsule (100 mg total) by mouth at bedtime.   ipratropium (ATROVENT) 0.03 % nasal spray Place 1 spray into both nostrils 2 (two) times daily.   lidocaine-prilocaine (EMLA) cream Apply 1 application topically as needed.   loperamide (IMODIUM) 2 MG capsule Take 1 capsule (2 mg total) by mouth See admin instructions. With onset of loose stool, take 26m followed by 266mevery 2 hours,  Maximum: 16 mg/day   loratadine (CLARITIN) 10 MG tablet Take 10 mg by mouth daily.   Multiple Vitamins-Minerals (ONE-A-DAY WOMENS 50 PLUS PO) Take 1 tablet by mouth daily.     ondansetron (ZOFRAN) 8 MG tablet Take 1 tablet (8 mg total) by mouth 2 (two) times daily as needed for refractory nausea / vomiting. Start on day 3 after chemotherapy.   potassium chloride SA (KLOR-CON) 20 MEQ tablet Take 1 tablet (20 mEq total) by mouth 2 (two) times daily.   prochlorperazine (COMPAZINE) 10 MG tablet Take 1 tablet (10 mg total) by mouth every 6 (six) hours as needed (NAUSEA).   simvastatin (ZOCOR) 40 MG tablet Take 1 tablet (40 mg total) by mouth at bedtime.   triamterene-hydrochlorothiazide (DYAZIDE) 37.5-25 MG capsule Take 1 each (1 capsule total) by mouth daily.   zinc gluconate 50 MG tablet Take 50 mg by mouth daily.   No current facility-administered medications on file prior to visit.    Review of Systems  Constitutional:  Negative for activity change, appetite change, chills, diaphoresis, fatigue and fever.  HENT:  Negative for congestion  and hearing loss.   Eyes:  Negative for visual disturbance.  Respiratory:  Negative for cough, chest tightness, shortness of breath and wheezing.   Cardiovascular:  Negative for chest pain, palpitations and leg swelling.  Gastrointestinal:  Negative for abdominal pain, constipation, diarrhea, nausea and vomiting.  Genitourinary:  Negative for dysuria, frequency and hematuria.  Musculoskeletal:  Negative for arthralgias and neck pain.  Skin:  Negative for rash.  Allergic/Immunologic: Negative for environmental allergies.  Neurological:  Negative for dizziness, weakness, light-headedness, numbness and headaches.  Hematological:  Negative for adenopathy.  Psychiatric/Behavioral:  Negative for behavioral problems, dysphoric mood and sleep disturbance.   Per HPI unless specifically indicated above      Objective:    BP (!) 98/45   Pulse 72   Ht '5\' 5"'  (1.651 m)   Wt 165 lb 9.6 oz (75.1 kg)   SpO2 100%   BMI 27.56 kg/m   Wt Readings from Last 3 Encounters:  06/27/21 165 lb 9.6 oz (75.1 kg)  06/21/21 169 lb (76.7 kg)   06/07/21 170 lb (77.1 kg)    Physical Exam Vitals and nursing note reviewed.  Constitutional:      General: She is not in acute distress.    Appearance: She is well-developed. She is not diaphoretic.     Comments: Well-appearing, comfortable, cooperative  HENT:     Head: Normocephalic and atraumatic.  Eyes:     General:        Right eye: No discharge.        Left eye: No discharge.     Conjunctiva/sclera: Conjunctivae normal.     Pupils: Pupils are equal, round, and reactive to light.  Neck:     Thyroid: No thyromegaly.     Vascular: No carotid bruit.  Cardiovascular:     Rate and Rhythm: Normal rate and regular rhythm.     Pulses: Normal pulses.     Heart sounds: Normal heart sounds. No murmur heard. Pulmonary:     Effort: Pulmonary effort is normal. No respiratory distress.     Breath sounds: Normal breath sounds. No wheezing or rales.  Abdominal:     General: Bowel sounds are normal. There is no distension.     Palpations: Abdomen is soft. There is no mass.     Tenderness: There is no abdominal tenderness.  Musculoskeletal:        General: No tenderness. Normal range of motion.     Cervical back: Normal range of motion and neck supple.     Right lower leg: No edema.     Left lower leg: No edema.     Comments: Upper / Lower Extremities: - Normal muscle tone, strength bilateral upper extremities 5/5, lower extremities 5/5  Lymphadenopathy:     Cervical: No cervical adenopathy.  Skin:    General: Skin is warm and dry.     Findings: Lesion (2-3 cm very superficial skin ulceration gluteal cleft R side, no deeper ulceration, no drainage or bleeding, no erythema, non tender) present. No erythema or rash.  Neurological:     Mental Status: She is alert and oriented to person, place, and time.     Comments: Distal sensation intact to light touch all extremities  Psychiatric:        Mood and Affect: Mood normal.        Behavior: Behavior normal.        Thought Content:  Thought content normal.     Comments: Well groomed, good eye contact, normal  speech and thoughts      Results for orders placed or performed in visit on 06/23/21  Thyroid Panel With TSH  Result Value Ref Range   T3 Uptake 26 22 - 35 %   T4, Total 15.5 (H) 5.1 - 11.9 mcg/dL   Free Thyroxine Index 4.0 (H) 1.4 - 3.8   TSH 2.35 0.40 - 4.50 mIU/L  Lipid panel  Result Value Ref Range   Cholesterol 156 <200 mg/dL   HDL 61 > OR = 50 mg/dL   Triglycerides 95 <150 mg/dL   LDL Cholesterol (Calc) 77 mg/dL (calc)   Total CHOL/HDL Ratio 2.6 <5.0 (calc)   Non-HDL Cholesterol (Calc) 95 <130 mg/dL (calc)  COMPLETE METABOLIC PANEL WITH GFR  Result Value Ref Range   Glucose, Bld 84 65 - 99 mg/dL   BUN 18 7 - 25 mg/dL   Creat 1.10 (H) 0.60 - 1.00 mg/dL   eGFR 53 (L) > OR = 60 mL/min/1.26m   BUN/Creatinine Ratio 16 6 - 22 (calc)   Sodium 140 135 - 146 mmol/L   Potassium 3.9 3.5 - 5.3 mmol/L   Chloride 104 98 - 110 mmol/L   CO2 28 20 - 32 mmol/L   Calcium 9.4 8.6 - 10.4 mg/dL   Total Protein 5.9 (L) 6.1 - 8.1 g/dL   Albumin 3.8 3.6 - 5.1 g/dL   Globulin 2.1 1.9 - 3.7 g/dL (calc)   AG Ratio 1.8 1.0 - 2.5 (calc)   Total Bilirubin 0.5 0.2 - 1.2 mg/dL   Alkaline phosphatase (APISO) 68 37 - 153 U/L   AST 24 10 - 35 U/L   ALT 20 6 - 29 U/L  CBC with Differential/Platelet  Result Value Ref Range   WBC 2.2 (L) 3.8 - 10.8 Thousand/uL   RBC 3.83 3.80 - 5.10 Million/uL   Hemoglobin 11.9 11.7 - 15.5 g/dL   HCT 36.8 35.0 - 45.0 %   MCV 96.1 80.0 - 100.0 fL   MCH 31.1 27.0 - 33.0 pg   MCHC 32.3 32.0 - 36.0 g/dL   RDW 15.6 (H) 11.0 - 15.0 %   Platelets 207 140 - 400 Thousand/uL   MPV 9.4 7.5 - 12.5 fL   Neutro Abs 1,115 (L) 1,500 - 7,800 cells/uL   Lymphs Abs 935 850 - 3,900 cells/uL   Absolute Monocytes 90 (L) 200 - 950 cells/uL   Eosinophils Absolute 40 15 - 500 cells/uL   Basophils Absolute 20 0 - 200 cells/uL   Neutrophils Relative % 50.7 %   Total Lymphocyte 42.5 %   Monocytes Relative  4.1 %   Eosinophils Relative 1.8 %   Basophils Relative 0.9 %   Smear Review    Hemoglobin A1c  Result Value Ref Range   Hgb A1c MFr Bld 5.3 <5.7 % of total Hgb   Mean Plasma Glucose 105 mg/dL   eAG (mmol/L) 5.8 mmol/L      Assessment & Plan:   Problem List Items Addressed This Visit     [place]   Hyperlipidemia   Chronic deep vein thrombosis (DVT) (HCC)   Chemotherapy induced neutropenia (HCC)   Benign hypertension with CKD (chronic kidney disease), stage II   Other Visit Diagnoses     Annual physical exam    -  Primary   Needs flu shot       Relevant Orders   Flu Vaccine QUAD High Dose(Fluad) (Completed)       Updated Health Maintenance information High dose flu shot today  Return for covid booster when ready Reviewed recent lab results with patient Encouraged improvement to lifestyle with diet and exercise Goal maintain weight and nutrition as advised per Oncology  Stage 1 pressure sore w/ gluteal cleft - use barrier cream, reviewed appropriate skin care  Continues on chemotherapy management per Oncology  neutropenia Mild low WBC on last lab, will route to Dr Tasia Catchings for review. Likely secondary to chemo.  No orders of the defined types were placed in this encounter.    Follow up plan: Return in about 6 months (around 12/25/2021) for 6 month follow-up Onc updates, Pressure Sore, HTN.  Nobie Putnam, [facility] 06/27/2021, 8:13 AM

## 2021-06-28 ENCOUNTER — Ambulatory Visit (INDEPENDENT_AMBULATORY_CARE_PROVIDER_SITE_OTHER): Payer: Medicare Other

## 2021-06-28 ENCOUNTER — Other Ambulatory Visit: Payer: Self-pay | Admitting: Family Medicine

## 2021-06-28 VITALS — Ht 65.0 in | Wt 165.0 lb

## 2021-06-28 DIAGNOSIS — Z7901 Long term (current) use of anticoagulants: Secondary | ICD-10-CM

## 2021-06-28 DIAGNOSIS — Z0000 Encounter for general adult medical examination without abnormal findings: Secondary | ICD-10-CM

## 2021-06-28 DIAGNOSIS — I825Z3 Chronic embolism and thrombosis of unspecified deep veins of distal lower extremity, bilateral: Secondary | ICD-10-CM

## 2021-06-28 NOTE — Patient Instructions (Signed)
[name] , Thank you for taking time to come for your Medicare Wellness Visit. I appreciate your ongoing commitment to your health goals. Please review the following plan we discussed and let me know if I can assist you in the future.   Screening recommendations/referrals: Colonoscopy: completed 07/15/2020, due 07/15/2021 Mammogram: completed 01/11/2021 Bone Density: due Recommended yearly ophthalmology/optometry visit for glaucoma screening and checkup Recommended yearly dental visit for hygiene and checkup  Vaccinations: Influenza vaccine: completed 06/27/2021 Pneumococcal vaccine: completed 10/24/2016 Tdap vaccine: due Shingles vaccine: discussed   Covid-19: 08/02/2020, 12/23/2019, 12/01/2019  Advanced directives: copy in chart  Conditions/risks identified: none  Next appointment: Follow up in one year for your annual wellness visit    Preventive Care 74 Years and Older, Female Preventive care refers to lifestyle choices and visits with your health care provider that can promote health and wellness. What does preventive care include? A yearly physical exam. This is also called an annual well check. Dental exams once or twice a year. Routine eye exams. Ask your health care provider how often you should have your eyes checked. Personal lifestyle choices, including: Daily care of your teeth and gums. Regular physical activity. Eating a healthy diet. Avoiding tobacco and drug use. Limiting alcohol use. Practicing safe sex. Taking low-dose aspirin every day. Taking vitamin and mineral supplements as recommended by your health care provider. What happens during an annual well check? The services and screenings done by your health care provider during your annual well check will depend on your age, overall health, lifestyle risk factors, and family history of disease. Counseling  Your health care provider may ask you questions about your: Alcohol use. Tobacco use. Drug  use. Emotional well-being. Home and relationship well-being. Sexual activity. Eating habits. History of falls. Memory and ability to understand (cognition). Work and work Statistician. Reproductive health. Screening  You may have the following tests or measurements: Height, weight, and BMI. Blood pressure. Lipid and cholesterol levels. These may be checked every 5 years, or more frequently if you are over 43 years old. Skin check. Lung cancer screening. You may have this screening every year starting at age 74 if you have a 30-pack-year history of smoking and currently smoke or have quit within the past 15 years. Fecal occult blood test (FOBT) of the stool. You may have this test every year starting at age 74. Flexible sigmoidoscopy or colonoscopy. You may have a sigmoidoscopy every 5 years or a colonoscopy every 10 years starting at age 74. Hepatitis C blood test. Hepatitis B blood test. Sexually transmitted disease (STD) testing. Diabetes screening. This is done by checking your blood sugar (glucose) after you have not eaten for a while (fasting). You may have this done every 1-3 years. Bone density scan. This is done to screen for osteoporosis. You may have this done starting at age 74. Mammogram. This may be done every 1-2 years. Talk to your health care provider about how often you should have regular mammograms. Talk with your health care provider about your test results, treatment options, and if necessary, the need for more tests. Vaccines  Your health care provider may recommend certain vaccines, such as: Influenza vaccine. This is recommended every year. Tetanus, diphtheria, and acellular pertussis (Tdap, Td) vaccine. You may need a Td booster every 10 years. Zoster vaccine. You may need this after age 12. Pneumococcal 13-valent conjugate (PCV13) vaccine. One dose is recommended after age 74. Pneumococcal polysaccharide (PPSV23) vaccine. One dose is recommended after age  74.  Talk to your health care provider about which screenings and vaccines you need and how often you need them. This information is not intended to replace advice given to you by your health care provider. Make sure you discuss any questions you have with your health care provider. Document Released: 10/29/2015 Document Revised: 06/21/2016 Document Reviewed: 08/03/2015 Elsevier Interactive Patient Education  2017 [place] Prevention in the Home Falls can cause injuries. They can happen to people of all ages. There are many things you can do to make your home safe and to help prevent falls. What can I do on the outside of my home? Regularly fix the edges of walkways and driveways and fix any cracks. Remove anything that might make you trip as you walk through a door, such as a raised step or threshold. Trim any bushes or trees on the path to your home. Use bright outdoor lighting. Clear any walking paths of anything that might make someone trip, such as rocks or tools. Regularly check to see if handrails are loose or broken. Make sure that both sides of any steps have handrails. Any raised decks and porches should have guardrails on the edges. Have any leaves, snow, or ice cleared regularly. Use sand or salt on walking paths during winter. Clean up any spills in your garage right away. This includes oil or grease spills. What can I do in the bathroom? Use night lights. Install grab bars by the toilet and in the tub and shower. Do not use towel bars as grab bars. Use non-skid mats or decals in the tub or shower. If you need to sit down in the shower, use a plastic, non-slip stool. Keep the floor dry. Clean up any water that spills on the floor as soon as it happens. Remove soap buildup in the tub or shower regularly. Attach bath mats securely with double-sided non-slip rug tape. Do not have throw rugs and other things on the floor that can make you trip. What can I do in the  bedroom? Use night lights. Make sure that you have a light by your bed that is easy to reach. Do not use any sheets or blankets that are too big for your bed. They should not hang down onto the floor. Have a firm chair that has side arms. You can use this for support while you get dressed. Do not have throw rugs and other things on the floor that can make you trip. What can I do in the kitchen? Clean up any spills right away. Avoid walking on wet floors. Keep items that you use a lot in easy-to-reach places. If you need to reach something above you, use a strong step stool that has a grab bar. Keep electrical cords out of the way. Do not use floor polish or wax that makes floors slippery. If you must use wax, use non-skid floor wax. Do not have throw rugs and other things on the floor that can make you trip. What can I do with my stairs? Do not leave any items on the stairs. Make sure that there are handrails on both sides of the stairs and use them. Fix handrails that are broken or loose. Make sure that handrails are as long as the stairways. Check any carpeting to make sure that it is firmly attached to the stairs. Fix any carpet that is loose or worn. Avoid having throw rugs at the top or bottom of the stairs. If you do have throw rugs,  attach them to the floor with carpet tape. Make sure that you have a light switch at the top of the stairs and the bottom of the stairs. If you do not have them, ask someone to add them for you. What else can I do to help prevent falls? Wear shoes that: Do not have high heels. Have rubber bottoms. Are comfortable and fit you well. Are closed at the toe. Do not wear sandals. If you use a stepladder: Make sure that it is fully opened. Do not climb a closed stepladder. Make sure that both sides of the stepladder are locked into place. Ask someone to hold it for you, if possible. Clearly mark and make sure that you can see: Any grab bars or  handrails. First and last steps. Where the edge of each step is. Use tools that help you move around (mobility aids) if they are needed. These include: Canes. Walkers. Scooters. Crutches. Turn on the lights when you go into a dark area. Replace any light bulbs as soon as they burn out. Set up your furniture so you have a clear path. Avoid moving your furniture around. If any of your floors are uneven, fix them. If there are any pets around you, be aware of where they are. Review your medicines with your doctor. Some medicines can make you feel dizzy. This can increase your chance of falling. Ask your doctor what other things that you can do to help prevent falls. This information is not intended to replace advice given to you by your health care provider. Make sure you discuss any questions you have with your health care provider. Document Released: 07/29/2009 Document Revised: 03/09/2016 Document Reviewed: 11/06/2014 Elsevier Interactive Patient Education  2017 [employer].

## 2021-06-28 NOTE — Progress Notes (Signed)
I connected with Jacqueline Yoder today by telephone and verified that I am speaking with the correct person using two identifiers. Location patient: home Location provider: work Persons participating in the virtual visit: Jacqueline Yoder, Jacqueline Yoder.   I discussed the limitations, risks, security and privacy concerns of performing an evaluation and management service by telephone and the availability of in person appointments. I also discussed with the patient that there may be a patient responsible charge related to this service. The patient expressed understanding and verbally consented to this telephonic visit.    Interactive audio and video telecommunications were attempted between this provider and patient, however failed, due to patient having technical difficulties OR patient did not have access to video capability.  We continued and completed visit with audio only.     Vital signs may be patient reported or missing.  Subjective:   Jacqueline Yoder is a 75 y.o. female who presents for an Initial Medicare Annual Wellness Visit.  Review of Systems     Cardiac Risk Factors include: advanced age (>64mn, >>39women);dyslipidemia;hypertension;sedentary lifestyle     Objective:    Today's Vitals   06/28/21 0856  Weight: 165 lb (74.8 kg)  Height: '5\' 5"'$  (1.651 m)   Body mass index is 27.46 kg/m.  Advanced Directives 06/28/2021 06/21/2021 06/07/2021 05/24/2021 05/10/2021 04/26/2021 03/29/2021  Does Patient Have a Medical Advance Directive? Yes Yes Yes Yes Yes Yes Yes  Type of AParamedicof [place] will [place] will Living will;Healthcare Power of Attorney Living will;Healthcare Power of Attorney Living will;Healthcare Power of Attorney Living will;Healthcare Power of Attorney Living will;Healthcare Power of Attorney  Does patient want to make changes to medical advance directive? - - - - - - -  Copy of [place]in  Chart? Yes - validated most recent copy scanned in chart (See row information) - - - - - -  Would patient like information on creating a medical advance directive? - - - - - - -    Current Medications (verified) Outpatient Encounter Medications as of 06/28/2021  Medication Sig   apixaban (ELIQUIS) 2.5 MG TABS tablet Take 1 tablet (2.5 mg total) by mouth 2 (two) times daily.   Cholecalciferol (VITAMIN D3) 2000 units capsule Take 2,000 Units by mouth daily.   diclofenac sodium (VOLTAREN) 1 % GEL Apply 2 g topically 4 (four) times daily as needed (joint pain).   diphenoxylate-atropine (LOMOTIL) 2.5-0.025 MG tablet Take 1 tablet by mouth 4 (four) times daily as needed for diarrhea or loose stools.   fluticasone (FLONASE) 50 MCG/ACT nasal spray Place 1 spray into both nostrils daily.   gabapentin (NEURONTIN) 100 MG capsule Take 1 capsule (100 mg total) by mouth at bedtime.   ipratropium (ATROVENT) 0.03 % nasal spray Place 1 spray into both nostrils 2 (two) times daily.   lidocaine-prilocaine (EMLA) cream Apply 1 application topically as needed.   loperamide (IMODIUM) 2 MG capsule Take 1 capsule (2 mg total) by mouth See admin instructions. With onset of loose stool, take '4mg'$  followed by '2mg'$  every 2 hours,  Maximum: 16 mg/day   loratadine (CLARITIN) 10 MG tablet Take 10 mg by mouth daily.   Multiple Vitamins-Minerals (ONE-A-DAY WOMENS 50 PLUS PO) Take 1 tablet by mouth daily.    ondansetron (ZOFRAN) 8 MG tablet Take 1 tablet (8 mg total) by mouth 2 (two) times daily as needed for refractory nausea / vomiting. Start on day 3 after chemotherapy.  potassium chloride SA (KLOR-CON) 20 MEQ tablet Take 1 tablet (20 mEq total) by mouth 2 (two) times daily.   prochlorperazine (COMPAZINE) 10 MG tablet Take 1 tablet (10 mg total) by mouth every 6 (six) hours as needed (NAUSEA).   simvastatin (ZOCOR) 40 MG tablet Take 1 tablet (40 mg total) by mouth at bedtime.   triamterene-hydrochlorothiazide (DYAZIDE) 37.5-25  MG capsule Take 1 each (1 capsule total) by mouth daily.   zinc gluconate 50 MG tablet Take 50 mg by mouth daily.   No facility-administered encounter medications on file as of 06/28/2021.    Allergies (verified) Sulfamethoxazole-trimethoprim   History: Past Medical History:  Diagnosis Date   Allergy    Arthritis    Blood clot in vein    Family history of colon cancer    GERD (gastroesophageal reflux disease)    Hypercholesteremia    Hypertension    Hypertension    Lower extremity edema    Rectal cancer ([place]) 12/2018   Urinary incontinence    Past Surgical History:  Procedure Laterality Date   ABDOMINAL HYSTERECTOMY     CHOLECYSTECTOMY  1971   COLONOSCOPY WITH PROPOFOL N/A 12/03/2018   Procedure: COLONOSCOPY WITH PROPOFOL;  Surgeon: Lucilla Lame, MD;  Location: [facility] ENDOSCOPY;  Service: Endoscopy;  Laterality: N/A;   COLONOSCOPY WITH PROPOFOL N/A 07/15/2020   Procedure: COLONOSCOPY WITH PROPOFOL;  Surgeon: [name], MD;  Location: [facility] ENDOSCOPY;  Service: Gastroenterology;  Laterality: N/A;   FLEXIBLE SIGMOIDOSCOPY N/A 12/06/2018   Procedure: FLEXIBLE SIGMOIDOSCOPY;  Surgeon: [name], MD;  Location: [facility] ENDOSCOPY;  Service: Endoscopy;  Laterality: N/A;   LAPAROSCOPIC COLOSTOMY  01/06/2019   PORTACATH PLACEMENT N/A 04/03/2019   Procedure: INSERTION PORT-A-CATH;  Surgeon: Jules Husbands, MD;  Location: [facility] ORS;  Service: General;  Laterality: N/A;   Family History  Problem Relation Age of Onset   Colon cancer Brother 45       exposure to chemicals [place]   Hypertension Mother    Stroke Mother    Kidney failure Father    Breast cancer Neg Hx    Ovarian cancer Neg Hx    Social History   Socioeconomic History   Marital status: Widowed    Spouse name: Not on file   Number of children: Not on file   Years of education: Not on file   Highest education level: Not on file  Occupational History   Not on file  Tobacco Use   Smoking status: Former    Types:  Cigarettes    Quit date: 12/02/1977    Years since quitting: 43.6   Smokeless tobacco: Former  Scientific laboratory technician Use: Never used  Substance and Sexual Activity   Alcohol use: Never   Drug use: Never   Sexual activity: Not Currently    Birth control/protection: None  Other Topics Concern   Not on file  Social History Narrative   Lives with daughter   Social Determinants of Health   Financial Resource Strain: Low Risk    Difficulty of Paying Living Expenses: Not hard at all  Food Insecurity: No Food Insecurity   Worried About Charity fundraiser in the Last Year: Never true   [place] in the Last Year: Never true  Transportation Needs: No Transportation Needs   Lack of Transportation (Medical): No   Lack of Transportation (Non-Medical): No  Physical Activity: Inactive   Days of Exercise per Week: 0 days   Minutes of Exercise per Session:  0 min  Stress: No Stress Concern Present   Feeling of Stress : Not at all  Social Connections: Not on file    Tobacco Counseling Counseling given: Not Answered   Clinical Intake:  Pre-visit preparation completed: Yes  Pain : No/denies pain     Nutritional Status: BMI 25 -29 Overweight Nutritional Risks: Nausea/ vomitting/ diarrhea (diarrhea from medication) Diabetes: No  How often do you need to have someone help you when you read instructions, pamphlets, or other written materials from your doctor or pharmacy?: 1 - Never What is the last grade level you completed in school?: 12th grade  Diabetic? no  Interpreter Needed?: No  Information entered by :: NAllen Yoder   Activities of Daily Living In your present state of health, do you have any difficulty performing the following activities: 06/28/2021  Hearing? N  Vision? Y  Comment has cataracts  Difficulty concentrating or making decisions? N  Walking or climbing stairs? N  Dressing or bathing? N  Doing errands, shopping? Y  Comment does not Education officer, community and eating ? N  Using the Toilet? N  In the past six months, have you accidently leaked urine? N  Do you have problems with loss of bowel control? Y  Managing your Medications? N  Managing your Finances? N  Housekeeping or managing your Housekeeping? N  Some recent data might be hidden    Patient Care Team: Olin Hauser, DO as PCP - General (Family Medicine) Clent Jacks, RN as Registered Nurse Earlie Server, MD as Consulting Physician (Hematology and Oncology)  Indicate any recent Medical Services you may have received from other than [facility] providers in the past year (date may be approximate).     Assessment:   This is a routine wellness examination for [facility].  Hearing/Vision screen No results found.  Dietary issues and exercise activities discussed: Current Exercise Habits: The patient does not participate in regular exercise at present   Goals Addressed             This Visit's Progress    Patient Stated       06/28/2021, no goals       Depression Screen PHQ 2/9 Scores 06/28/2021 09/13/2020  PHQ - 2 Score 0 0    Fall Risk Fall Risk  06/28/2021 06/27/2021 09/13/2020 04/23/2019  Falls in the past year? 0 0 0 0  Number falls in past yr: - 0 0 -  Injury with Fall? - 0 0 -  Risk for fall due to : Medication side effect - - -  Follow up Falls evaluation completed;Education provided;Falls prevention discussed Falls evaluation completed Falls evaluation completed -    FALL RISK PREVENTION PERTAINING TO THE HOME:  Any stairs in or around the home? Yes  If so, are there any without handrails? No  Home free of loose throw rugs in walkways, pet beds, electrical cords, etc? Yes  Adequate lighting in your home to reduce risk of falls? Yes   ASSISTIVE DEVICES UTILIZED TO PREVENT FALLS:  Life alert? No  Use of a cane, walker or w/c? No  Grab bars in the bathroom? Yes  Shower chair or bench in shower? No  Elevated toilet seat or a handicapped toilet? Yes    TIMED UP AND GO:  Was the test performed? No .      Cognitive Function:     6CIT Screen 06/28/2021  What Year? 0 points  What month? 0 points  What time? 3  points  Count back from 20 0 points  Months in reverse 0 points  Repeat phrase 4 points  Total Score 7    Immunizations Immunization History  Administered Date(s) Administered   Fluad Quad(high Dose 65+) 07/23/2020, 06/27/2021   Influenza Inj Mdck Quad Pf 10/03/2016, 08/26/2019   Influenza Split 10/01/2015   Influenza-Unspecified 08/08/2017, 07/30/2018, 07/18/2019   PFIZER(Purple Top)SARS-COV-2 Vaccination 12/01/2019, 12/23/2019, 08/02/2020   Pneumococcal Conjugate-13 10/24/2016   Pneumococcal Polysaccharide-23 03/30/2015    TDAP status: Due, Education has been provided regarding the importance of this vaccine. Advised may receive this vaccine at local pharmacy or Health Dept. Aware to provide a copy of the vaccination record if obtained from local pharmacy or Health Dept. Verbalized acceptance and understanding.  Flu Vaccine status: Up to date  Pneumococcal vaccine status: Up to date  Covid-19 vaccine status: Completed vaccines  Qualifies for Shingles Vaccine? Yes   Zostavax completed No   Shingrix Completed?: No.    Education has been provided regarding the importance of this vaccine. Patient has been advised to call insurance company to determine out of pocket expense if they have not yet received this vaccine. Advised may also receive vaccine at local pharmacy or Health Dept. Verbalized acceptance and understanding.  Screening Tests Health Maintenance  Topic Date Due   Hepatitis C Screening  Never done   Zoster Vaccines- Shingrix (1 of 2) Never done   DEXA SCAN  Never done   COVID-19 Vaccine (4 - Booster for Pfizer series) 10/25/2020   TETANUS/TDAP  06/27/2022 (Originally 03/31/1965)   COLONOSCOPY (Pts 45-14yr Insurance coverage will need to be confirmed)  07/15/2021   MAMMOGRAM  01/12/2023   INFLUENZA  VACCINE  Completed   PNA vac Low Risk Adult  Completed   HPV VACCINES  Aged Out    Health Maintenance  Health Maintenance Due  Topic Date Due   Hepatitis C Screening  Never done   Zoster Vaccines- Shingrix (1 of 2) Never done   DEXA SCAN  Never done   COVID-19 Vaccine (4 - Booster for Pfizer series) 10/25/2020    Colorectal cancer screening: Type of screening: Colonoscopy. Completed 07/15/2020. Repeat every 1 years  Mammogram status: Completed 01/11/2021. Repeat every year  Bone Density status: due  Lung Cancer Screening: (Low Dose CT Chest recommended if Age 74-80years, 30 pack-year currently smoking OR have quit w/in 15years.) does not qualify.   Lung Cancer Screening Referral: no  Additional Screening:  Hepatitis C Screening: does qualify; due  Vision Screening: Recommended annual ophthalmology exams for early detection of glaucoma and other disorders of the eye. Is the patient up to date with their annual eye exam?  Yes  Who is the provider or what is the name of the office in which the patient attends annual eye exams[facility] is not established with a provider, would they like to be referred to a provider to establish care? No .   Dental Screening: Recommended annual dental exams for proper oral hygiene  Community Resource Referral / Chronic Care Management: CRR required this visit?  No   CCM required this visit?  No      Plan:     I have personally reviewed and noted the following in the patient's chart:   Medical and social history Use of alcohol, tobacco or illicit drugs  Current medications and supplements including opioid prescriptions. Patient is not currently taking opioid prescriptions. Functional ability and status Nutritional status Physical activity Advanced directives List of  other physicians Hospitalizations, surgeries, and ER visits in previous 12 months Vitals Screenings to include cognitive, depression, and falls Referrals  and appointments  In addition, I have reviewed and discussed with patient certain preventive protocols, quality metrics, and best practice recommendations. A written personalized care plan for preventive services as well as general preventive health recommendations were provided to patient.     Kellie Simmering, Yoder   [phone]   Nurse Notes:

## 2021-06-29 ENCOUNTER — Other Ambulatory Visit: Payer: Self-pay | Admitting: Family Medicine

## 2021-06-29 DIAGNOSIS — Z933 Colostomy status: Secondary | ICD-10-CM | POA: Diagnosis not present

## 2021-07-03 DIAGNOSIS — C799 Secondary malignant neoplasm of unspecified site: Secondary | ICD-10-CM | POA: Diagnosis not present

## 2021-07-03 DIAGNOSIS — C20 Malignant neoplasm of rectum: Secondary | ICD-10-CM | POA: Diagnosis not present

## 2021-07-05 ENCOUNTER — Ambulatory Visit: Payer: Medicare Other

## 2021-07-05 ENCOUNTER — Inpatient Hospital Stay: Payer: Medicare Other

## 2021-07-05 ENCOUNTER — Inpatient Hospital Stay (HOSPITAL_BASED_OUTPATIENT_CLINIC_OR_DEPARTMENT_OTHER): Payer: Medicare Other | Admitting: Oncology

## 2021-07-05 ENCOUNTER — Encounter: Payer: Self-pay | Admitting: Oncology

## 2021-07-05 VITALS — BP 118/71 | HR 87 | Temp 97.8°F | Resp 20 | Wt 166.2 lb

## 2021-07-05 DIAGNOSIS — I10 Essential (primary) hypertension: Secondary | ICD-10-CM | POA: Diagnosis not present

## 2021-07-05 DIAGNOSIS — C20 Malignant neoplasm of rectum: Secondary | ICD-10-CM

## 2021-07-05 DIAGNOSIS — Z86711 Personal history of pulmonary embolism: Secondary | ICD-10-CM | POA: Diagnosis not present

## 2021-07-05 DIAGNOSIS — T451X5A Adverse effect of antineoplastic and immunosuppressive drugs, initial encounter: Secondary | ICD-10-CM | POA: Diagnosis not present

## 2021-07-05 DIAGNOSIS — C799 Secondary malignant neoplasm of unspecified site: Secondary | ICD-10-CM | POA: Diagnosis not present

## 2021-07-05 DIAGNOSIS — K219 Gastro-esophageal reflux disease without esophagitis: Secondary | ICD-10-CM | POA: Diagnosis not present

## 2021-07-05 DIAGNOSIS — Z800 Family history of malignant neoplasm of digestive organs: Secondary | ICD-10-CM | POA: Diagnosis not present

## 2021-07-05 DIAGNOSIS — Z933 Colostomy status: Secondary | ICD-10-CM | POA: Diagnosis not present

## 2021-07-05 DIAGNOSIS — Z5189 Encounter for other specified aftercare: Secondary | ICD-10-CM | POA: Diagnosis not present

## 2021-07-05 DIAGNOSIS — Z5111 Encounter for antineoplastic chemotherapy: Secondary | ICD-10-CM | POA: Diagnosis not present

## 2021-07-05 DIAGNOSIS — R609 Edema, unspecified: Secondary | ICD-10-CM | POA: Diagnosis not present

## 2021-07-05 DIAGNOSIS — E876 Hypokalemia: Secondary | ICD-10-CM

## 2021-07-05 DIAGNOSIS — C7800 Secondary malignant neoplasm of unspecified lung: Secondary | ICD-10-CM | POA: Diagnosis not present

## 2021-07-05 DIAGNOSIS — C7982 Secondary malignant neoplasm of genital organs: Secondary | ICD-10-CM | POA: Diagnosis not present

## 2021-07-05 DIAGNOSIS — Z86718 Personal history of other venous thrombosis and embolism: Secondary | ICD-10-CM | POA: Diagnosis not present

## 2021-07-05 DIAGNOSIS — K521 Toxic gastroenteritis and colitis: Secondary | ICD-10-CM

## 2021-07-05 DIAGNOSIS — D702 Other drug-induced agranulocytosis: Secondary | ICD-10-CM | POA: Diagnosis not present

## 2021-07-05 DIAGNOSIS — Z923 Personal history of irradiation: Secondary | ICD-10-CM | POA: Diagnosis not present

## 2021-07-05 DIAGNOSIS — Z79899 Other long term (current) drug therapy: Secondary | ICD-10-CM | POA: Diagnosis not present

## 2021-07-05 DIAGNOSIS — R197 Diarrhea, unspecified: Secondary | ICD-10-CM | POA: Diagnosis not present

## 2021-07-05 DIAGNOSIS — E7800 Pure hypercholesterolemia, unspecified: Secondary | ICD-10-CM | POA: Diagnosis not present

## 2021-07-05 LAB — COMPREHENSIVE METABOLIC PANEL
ALT: 16 U/L (ref 0–44)
AST: 26 U/L (ref 15–41)
Albumin: 4.1 g/dL (ref 3.5–5.0)
Alkaline Phosphatase: 73 U/L (ref 38–126)
Anion gap: 9 (ref 5–15)
BUN: 8 mg/dL (ref 8–23)
CO2: 27 mmol/L (ref 22–32)
Calcium: 9.2 mg/dL (ref 8.9–10.3)
Chloride: 101 mmol/L (ref 98–111)
Creatinine, Ser: 1.06 mg/dL — ABNORMAL HIGH (ref 0.44–1.00)
GFR, Estimated: 55 mL/min — ABNORMAL LOW (ref >60)
Glucose, Bld: 115 mg/dL — ABNORMAL HIGH (ref 70–99)
Potassium: 3.3 mmol/L — ABNORMAL LOW (ref 3.5–5.1)
Sodium: 137 mmol/L (ref 135–145)
Total Bilirubin: 0.6 mg/dL (ref 0.3–1.2)
Total Protein: 7.1 g/dL (ref 6.5–8.1)

## 2021-07-05 LAB — CBC WITH DIFFERENTIAL/PLATELET
Abs Immature Granulocytes: 0.34 10*3/uL — ABNORMAL HIGH (ref 0.00–0.07)
Basophils Absolute: 0.1 10*3/uL (ref 0.0–0.1)
Basophils Relative: 1 %
Eosinophils Absolute: 0.1 10*3/uL (ref 0.0–0.5)
Eosinophils Relative: 2 %
HCT: 36.7 % (ref 36.0–46.0)
Hemoglobin: 11.5 g/dL — ABNORMAL LOW (ref 12.0–15.0)
Immature Granulocytes: 5 %
Lymphocytes Relative: 22 %
Lymphs Abs: 1.6 10*3/uL (ref 0.7–4.0)
MCH: 30.7 pg (ref 26.0–34.0)
MCHC: 31.3 g/dL (ref 30.0–36.0)
MCV: 98.1 fL (ref 80.0–100.0)
Monocytes Absolute: 0.7 10*3/uL (ref 0.1–1.0)
Monocytes Relative: 10 %
Neutro Abs: 4.3 10*3/uL (ref 1.7–7.7)
Neutrophils Relative %: 60 %
Platelets: 203 10*3/uL (ref 150–400)
RBC: 3.74 MIL/uL — ABNORMAL LOW (ref 3.87–5.11)
RDW: 16.3 % — ABNORMAL HIGH (ref 11.5–15.5)
WBC: 7 10*3/uL (ref 4.0–10.5)
nRBC: 0.4 % — ABNORMAL HIGH (ref 0.0–0.2)

## 2021-07-05 LAB — PROTEIN, URINE, RANDOM: Total Protein, Urine: 20 mg/dL

## 2021-07-05 MED ORDER — FLUOROURACIL CHEMO INJECTION 5 GM/100ML
2400.0000 mg/m2 | INTRAVENOUS | Status: DC
  Administered 2021-07-05: 4450 mg via INTRAVENOUS
  Filled 2021-07-05: qty 89

## 2021-07-05 MED ORDER — FLUOROURACIL CHEMO INJECTION 2.5 GM/50ML
400.0000 mg/m2 | Freq: Once | INTRAVENOUS | Status: AC
  Administered 2021-07-05: 750 mg via INTRAVENOUS
  Filled 2021-07-05: qty 15

## 2021-07-05 MED ORDER — POTASSIUM CHLORIDE IN NACL 20-0.9 MEQ/L-% IV SOLN
Freq: Once | INTRAVENOUS | Status: AC
  Filled 2021-07-05: qty 1000

## 2021-07-05 MED ORDER — SODIUM CHLORIDE 0.9 % IV SOLN
10.0000 mg | Freq: Once | INTRAVENOUS | Status: AC
  Administered 2021-07-05: 10 mg via INTRAVENOUS
  Filled 2021-07-05: qty 10

## 2021-07-05 MED ORDER — SODIUM CHLORIDE 0.9 % IV SOLN
150.0000 mg/m2 | Freq: Once | INTRAVENOUS | Status: AC
  Administered 2021-07-05: 280 mg via INTRAVENOUS
  Filled 2021-07-05: qty 10

## 2021-07-05 MED ORDER — SODIUM CHLORIDE 0.9 % IV SOLN
5.0000 mg/kg | Freq: Once | INTRAVENOUS | Status: AC
  Administered 2021-07-05: 400 mg via INTRAVENOUS
  Filled 2021-07-05: qty 16

## 2021-07-05 MED ORDER — SODIUM CHLORIDE 0.9% FLUSH
10.0000 mL | Freq: Once | INTRAVENOUS | Status: AC
  Administered 2021-07-05: 10 mL via INTRAVENOUS
  Filled 2021-07-05: qty 10

## 2021-07-05 MED ORDER — PALONOSETRON HCL INJECTION 0.25 MG/5ML
0.2500 mg | Freq: Once | INTRAVENOUS | Status: AC
  Administered 2021-07-05: 0.25 mg via INTRAVENOUS
  Filled 2021-07-05: qty 5

## 2021-07-05 MED ORDER — SODIUM CHLORIDE 0.9 % IV SOLN
750.0000 mg | Freq: Once | INTRAVENOUS | Status: AC
  Administered 2021-07-05: 750 mg via INTRAVENOUS
  Filled 2021-07-05: qty 37.5

## 2021-07-05 MED ORDER — ATROPINE SULFATE 1 MG/ML IJ SOLN
0.5000 mg | Freq: Once | INTRAMUSCULAR | Status: AC
  Administered 2021-07-05: 0.5 mg via INTRAVENOUS
  Filled 2021-07-05: qty 1

## 2021-07-05 MED ORDER — SODIUM CHLORIDE 0.9 % IV SOLN
Freq: Once | INTRAVENOUS | Status: AC
  Filled 2021-07-05: qty 250

## 2021-07-05 MED ORDER — DIPHENHYDRAMINE HCL 50 MG/ML IJ SOLN
25.0000 mg | Freq: Once | INTRAMUSCULAR | Status: AC
  Administered 2021-07-05: 25 mg via INTRAVENOUS
  Filled 2021-07-05: qty 1

## 2021-07-05 MED ORDER — MONTELUKAST SODIUM 10 MG PO TABS
10.0000 mg | ORAL_TABLET | Freq: Once | ORAL | Status: AC
  Administered 2021-07-05: 10 mg via ORAL
  Filled 2021-07-05: qty 1

## 2021-07-05 NOTE — Progress Notes (Signed)
Patient states she has been having ongoing Diarrhea but believes its what she been eating to cause this problem.

## 2021-07-05 NOTE — Progress Notes (Signed)
Per Dr. Tasia Catchings, [place] to to proceed with FOLFIRI; wait on urine protein before proceeding with bevacizumab. 1L NS with 20KCL to be added to treatment today.

## 2021-07-05 NOTE — Progress Notes (Signed)
Nutrition Assessment   Reason for Assessment:   RD screen for weight loss, diarrhea   ASSESSMENT:  75 year old female with metastatic rectal cancer, s/p TNT, followed by APR/posterior vaginectomy/TAH/BSO/VY-flap.  Recurrence 4/22 with lung metastasis on palliative chemotherapy.   Met with patient during infusion.  Patient reports that she has a good appetite but "everything goes right through me." Patient has colostomy bag.  "I eat things that I know I shouldn't." Likes greens, fried foods (ie french fries).  Likes oatmeal for breakfast, eggs and toast and sometimes breakfast meats.  Lunch is sub sandwich or peanut butter crackers and ensure.  Supper maybe fish with french fries, pork chops, cabbage and potatoes.  Drinks pedialyte, water, juices.     Medications: imodium, lomotil, MVI, compazine, zofran, KCL, Vit D3   Labs: K 3.3   Anthropometrics:   Height: 65 inches Weight: 166 lb 3.2 oz 170 lb on 7/26 168 lb on 6/14  BMI: 27  4% weight loss in the last 2 months, concerning   Estimated Energy Needs  Kcals: 8957-0220 Protein: 94-112 g Fluid: 1.8 L   NUTRITION DIAGNOSIS: Altered GI function related to cancer and cancer related treatment side effects as evidenced by 4% weight loss and increased ostomy output.   INTERVENTION:  Discussed foods to choose to help thicken stool.  Written food choices given to patient along with handout on diarrhea.  Encouraged taking diarrhea medication to help.      MONITORING, EVALUATION, GOAL: weight trends, intake   Next Visit: Tuesday, Oct 4th during infusion  Maeson Purohit B. Zenia Resides, [place], [place] Registered Dietitian [phone] (mobile)

## 2021-07-05 NOTE — Patient Instructions (Signed)
[facility] ONCOL[facility]  Discharge Instructions: Thank you for choosing [facility] to provide your oncology and hematology care.  If you have a lab appointment with the [facility], please go directly to the [facility] and check in at the registration area.  Wear comfortable clothing and clothing appropriate for easy access to any Portacath or PICC line.   We strive to give you quality time with your provider. You may need to reschedule your appointment if you arrive late (15 or more minutes).  Arriving late affects you and other patients whose appointments are after yours.  Also, if you miss three or more appointments without notifying the office, you may be dismissed from the clinic at the provider's discretion.      For prescription refill requests, have your pharmacy contact our office and allow 72 hours for refills to be completed.    Today you received the following chemotherapy and/or immunotherapy agents : Irinotecan, leucovorin, 45fU, avastin      To help prevent nausea and vomiting after your treatment, we encourage you to take your nausea medication as directed.  BELOW ARE SYMPTOMS THAT SHOULD BE REPORTED IMMEDIATELY: *FEVER GREATER THAN 100.4 F (38 C) OR HIGHER *CHILLS OR SWEATING *NAUSEA AND VOMITING THAT IS NOT CONTROLLED WITH YOUR NAUSEA MEDICATION *UNUSUAL SHORTNESS OF BREATH *UNUSUAL BRUISING OR BLEEDING *URINARY PROBLEMS (pain or burning when urinating, or frequent urination) *BOWEL PROBLEMS (unusual diarrhea, constipation, pain near the anus) TENDERNESS IN MOUTH AND THROAT WITH OR WITHOUT PRESENCE OF ULCERS (sore throat, sores in mouth, or a toothache) UNUSUAL RASH, SWELLING OR PAIN  UNUSUAL VAGINAL DISCHARGE OR ITCHING   Items with * indicate a potential emergency and should be followed up as soon as possible or go to the Emergency Department if any problems should occur.  Please show the CHEMOTHERAPY ALERT CARD or  IMMUNOTHERAPY ALERT CARD at check-in to the Emergency Department and triage nurse.  Should you have questions after your visit or need to cancel or reschedule your appointment, please contact [facility]  [phone] and follow the prompts.  Office hours are 8:00 a.m. to 4:30 p.m. Monday - Friday. Please note that voicemails left after 4:00 p.m. may not be returned until the following business day.  We are closed weekends and major holidays. You have access to a nurse at all times for urgent questions. Please call the main number to the clinic [phone] and follow the prompts.  For any non-urgent questions, you may also contact your provider using MyChart. We now offer e-Visits for anyone 17 and older to request care online for non-urgent symptoms. For details visit [email or web address].   Also download the MyChart app! Go to the app store, search "MyChart", open the app, select [facility], and log in with your MyChart username and password.  Due to Covid, a mask is required upon entering the hospital/clinic. If you do not have a mask, one will be given to you upon arrival. For doctor visits, patients may have 1 support person aged 64 or older with them. For treatment visits, patients cannot have anyone with them due to current Covid guidelines and our immunocompromised population.

## 2021-07-05 NOTE — Progress Notes (Signed)
Hematology/Oncology progress note [facility] [phone] [phone]   Patient Care Team: Karamalegos, Alexander J, DO as PCP - General (Family Medicine) Stanton, Kristi D, RN as Registered Nurse Roxanne Panek, MD as Consulting Physician (Hematology and Oncology)  CHIEF COMPLAINTS/REASON FOR VISIT:  Follow up for rectal cancer  HISTORY OF PRESENTING ILLNESS:  Patient initially presented with complaints of postmenopausal bleeding on 08/16/2018.  History of was menopausal vaginal bleeding in 2016 which resulted in cervical polypectomy.  Pathology 02/04/2015 showed cervical polyp, consistent with benign endometrial polyp.  [name] after polypectomy due to anxiety associated with pelvic exams.  pelvic exam on 08/16/2018 reviewed cervical abnormality and from enlarged uterus. Seen by Dr. Cherry on 10/29/2018.  Endometrial biopsy and a Pap smear was performed. 10/29/2018 Pap smear showed adenocarcinoma, favor endometrial origin. 10/29/2018 endometrial biopsy showed endometrioid carcinoma, FIGO grade 1.  10/29/2018- TA & TV Ultrasound revealed: Anteverted uterus measuring 8.7 x 5.6 x 6.4 cm without evidence of focal masses.  The endometrium measuring 24.1 mm (thickened) and heterogeneous.  Right and left ovaries not visualized.  No adnexal masses identified.  No free fluid in cul-de-sac.  Patient was seen by Dr. Secord in clinic on 11/13/2018.  Cervical exam reveals 2 cm exophytic irregular mass consistent with malignancy.   11/19/2018 CT chest abdomen pelvis with contrast showed thickened endometrium with some irregularity compatible with the provided diagnosis of endometrial malignancy.  There is a mildly prominent left inguinal node 1.4 cm.  Patient was seen by Dr. Berchuck on 11/20/2018 and left groin lymph node biopsy was recommended.  11/26/2018 patient underwent left inguinal lymph node biopsy. Pathology showed metastatic adenocarcinoma consistent  with colorectal origin.  CDX 2+.  Case was discussed on tumor board.  Recommend colonoscopy for further evaluation.  Patient reports significant weight loss 30 pounds over the last year.  Chronic vaginal spotting. Change of bowel habits the past few months.  More constipated.  Family history positive for brother who has colon cancer prostate cancer.  patient has underwent colonoscopy on 12/03/2018 which reviewed a nonobstructing large mass in the rectum.  Also chronic fistula.  Mass was not circumferential.  This was biopsied with a cold forceps for histology.  Pathology came back hyperplastic polyp negative for dysplasia and malignancy. [name], patient underwent flex sigmoidoscopy on 12/06/2018 with rebiopsy of the rectal mass. This time biopsy results came back positive for invasive colorectal adenocarcinoma, moderately differentiated. Immunotherapy for nearly mismatch repair protein (MMR ) was performed.  There is no loss of MMR expression.  low probability of MSI high.   # Seen by [name] for evaluation of resectability for rectal cancer. In addition, she also had a second opinion with [facility] where her endometrial biopsy pathology was changed to  adenocarcinoma, consistent with colorectal primary.   [name]. She has home health that has been assisting with ostomy care  Patient was also evaluated by [facility] oncology.  Recommendation is to proceed with TNT with concurrent chemoradiation followed by neoadjuvant chemotherapy followed by surgical resection. Patient prefers to have treatment done locally with [facility].   # Oncology Treatment:  02/03/2019- 03/19/2019  concurrent Xeloda and radiation.  Xeloda dose 825mg /m2 BID - rounded to 1650mg BID- on days of radiation. 04/09/2019, started on FOLFOX with bolus early.  Omitted.  07/16/2019 finished 8 cycles of FOLFOX. 09/17/19 APR/posterior vaginectomy/TAH/BSO/VY-flap pT4b pN0  with close vaginal margin 0.2 mm.  Uterus and ovaries   negative for malignancy. Patient reports bilateral lower extremity numbness and tingling, intermittent, left worse than right. She has lost a lot of weight since her APR surgery.   #Family history with half brother having's history of colon cancer prostate cancer.  Personal history of colorectal cancer.  Patient has not decided if she wants genetic testing.    # history of PE( 01/13/2020)  in the bilateral lower extremity DVT (01/13/2020).   She finishes 6 months of anticoagulation with Eliquis 5 mg twice daily. Now switched to Eliquis 2.5 mg twice daily..  # She has now developed recurrent disease. #06/30/20  vaginal introitus mass biopsied. Pathology is consistent with metastatic colorectal adenocarcinoma I have discussed with [name]  Dr. Hester Mates and the mass is not resectable. Patient has also had colonoscopy by Dr. Vicente Males yesterday. Normal examination. # 07/16/2020 cycle 1 FOLFIRI  # 07/20/2020 PET scan was done for further evaluation, images are consistent with local recurrence, no distant metastasis. #Discussed with radiation oncology Dr. Baruch Gouty will recommends concurrent chemotherapy and radiation. 08/02/2020-08/16/2020, patient starts radiation.  Xeloda was held due to neutropenia 08/17/2020,-09/06/2020 Xeloda 1500 mg twice daily concurrently with radiation  01/31/21 started on FOLFIRI + Bev   INTERVAL HISTORY Jacqueline Yoder is a 75 y.o. female who has above history reviewed by me presents for follow-up of rectal cancer. She tolerates chemotherapy well.  She denies nausea vomiting. She has had intermittent loose bowel movements, patient has a colostomy bag.  She takes Imodium as needed. Weight is stable. No nausea, vomiting   Review of Systems  Constitutional:  Negative for appetite change, chills, fatigue, fever and unexpected weight change.  HENT:   Negative for hearing loss and voice change.   Eyes:  Negative for eye  problems.  Respiratory:  Negative for chest tightness and cough.   Cardiovascular:  Negative for chest pain.  Gastrointestinal:  Positive for diarrhea. Negative for abdominal distention, abdominal pain, blood in stool, constipation and nausea.  Endocrine: Negative for hot flashes.  Genitourinary:  Negative for difficulty urinating and frequency.   Musculoskeletal:  Negative for arthralgias.  Skin:  Negative for itching and rash.  Neurological:  Negative for extremity weakness and numbness.  Hematological:  Negative for adenopathy.  Psychiatric/Behavioral:  Negative for confusion.    MEDICAL HISTORY:  Past Medical History:  Diagnosis Date   Allergy    Arthritis    Blood clot in vein    Family history of colon cancer    GERD (gastroesophageal reflux disease)    Hypercholesteremia    Hypertension    Hypertension    Lower extremity edema    Rectal cancer ([place]) 12/2018   Urinary incontinence     SURGICAL HISTORY: Past Surgical History:  Procedure Laterality Date   ABDOMINAL HYSTERECTOMY     CHOLECYSTECTOMY  1971   COLONOSCOPY WITH PROPOFOL N/A 12/03/2018   Procedure: COLONOSCOPY WITH PROPOFOL;  Surgeon: Lucilla Lame, MD;  Location: [facility] ENDOSCOPY;  Service: Endoscopy;  Laterality: N/A;   COLONOSCOPY WITH PROPOFOL N/A 07/15/2020   Procedure: COLONOSCOPY WITH PROPOFOL;  Surgeon: [name], MD;  Location: [facility] ENDOSCOPY;  Service: Gastroenterology;  Laterality: N/A;   FLEXIBLE SIGMOIDOSCOPY N/A 12/06/2018   Procedure: FLEXIBLE SIGMOIDOSCOPY;  Surgeon: [name], MD;  Location: [facility] ENDOSCOPY;  Service: Endoscopy;  Laterality: N/A;   LAPAROSCOPIC COLOSTOMY  01/06/2019   PORTACATH PLACEMENT N/A 04/03/2019   Procedure: INSERTION PORT-A-CATH;  Surgeon: Jules Husbands, MD;  Location: [facility] ORS;  Service: General;  Laterality: N/A;  SOCIAL HISTORY: Social History   Socioeconomic History   Marital status: Widowed    Spouse name: Not on file   Number of children: Not on file    Years of education: Not on file   Highest education level: Not on file  Occupational History   Not on file  Tobacco Use   Smoking status: Former    Types: Cigarettes    Quit date: 12/02/1977    Years since quitting: 43.6   Smokeless tobacco: Former  Scientific laboratory technician Use: Never used  Substance and Sexual Activity   Alcohol use: Never   Drug use: Never   Sexual activity: Not Currently    Birth control/protection: None  Other Topics Concern   Not on file  Social History Narrative   Lives with daughter   Social Determinants of Health   Financial Resource Strain: Low Risk    Difficulty of Paying Living Expenses: Not hard at all  Food Insecurity: No Food Insecurity   Worried About Charity fundraiser in the Last Year: Never true   [place] in the Last Year: Never true  Transportation Needs: No Transportation Needs   Lack of Transportation (Medical): No   Lack of Transportation (Non-Medical): No  Physical Activity: Inactive   Days of Exercise per Week: 0 days   Minutes of Exercise per Session: 0 min  Stress: No Stress Concern Present   Feeling of Stress : Not at all  Social Connections: Not on file  Intimate Partner Violence: Not on file    FAMILY HISTORY: Family History  Problem Relation Age of Onset   Colon cancer Brother 85       exposure to chemicals [place]   Hypertension Mother    Stroke Mother    Kidney failure Father    Breast cancer Neg Hx    Ovarian cancer Neg Hx     ALLERGIES:  is allergic to sulfamethoxazole-trimethoprim.  MEDICATIONS:  Current Outpatient Medications  Medication Sig Dispense Refill   Cholecalciferol (VITAMIN D3) 2000 units capsule Take 2,000 Units by mouth daily.     diclofenac sodium (VOLTAREN) 1 % GEL Apply 2 g topically 4 (four) times daily as needed (joint pain).  11   diphenoxylate-atropine (LOMOTIL) 2.5-0.025 MG tablet Take 1 tablet by mouth 4 (four) times daily as needed for diarrhea or loose stools. 30 tablet 1    ELIQUIS 2.5 MG TABS tablet Take 1 tablet by mouth twice daily 60 tablet 0   fluticasone (FLONASE) 50 MCG/ACT nasal spray Use 1 spray(s) in each nostril once daily 16 g 1   gabapentin (NEURONTIN) 100 MG capsule Take 1 capsule (100 mg total) by mouth at bedtime. 30 capsule 0   ipratropium (ATROVENT) 0.03 % nasal spray Place 1 spray into both nostrils 2 (two) times daily. 30 mL 2   lidocaine-prilocaine (EMLA) cream Apply 1 application topically as needed. 30 g 6   loperamide (IMODIUM) 2 MG capsule Take 1 capsule (2 mg total) by mouth See admin instructions. With onset of loose stool, take $RemoveBef'4mg'wKcgcKxZUL$  followed by $RemoveBef'2mg'nxQyIqXtIb$  every 2 hours,  Maximum: 16 mg/day 120 capsule 1   loratadine (CLARITIN) 10 MG tablet Take 10 mg by mouth daily.     Multiple Vitamins-Minerals (ONE-A-DAY WOMENS 50 PLUS PO) Take 1 tablet by mouth daily.      ondansetron (ZOFRAN) 8 MG tablet Take 1 tablet (8 mg total) by mouth 2 (two) times daily as needed for refractory nausea / vomiting. Start on  day 3 after chemotherapy. 30 tablet 1   potassium chloride SA (KLOR-CON) 20 MEQ tablet Take 1 tablet (20 mEq total) by mouth 2 (two) times daily. 90 tablet 3   prochlorperazine (COMPAZINE) 10 MG tablet Take 1 tablet (10 mg total) by mouth every 6 (six) hours as needed (NAUSEA). 30 tablet 1   simvastatin (ZOCOR) 40 MG tablet Take 1 tablet (40 mg total) by mouth at bedtime. 90 tablet 3   triamterene-hydrochlorothiazide (DYAZIDE) 37.5-25 MG capsule Take 1 each (1 capsule total) by mouth daily. 90 capsule 3   zinc gluconate 50 MG tablet Take 50 mg by mouth daily.     No current facility-administered medications for this visit.   Facility-Administered Medications Ordered in Other Visits  Medication Dose Route Frequency Provider Last Rate Last Admin   fluorouracil (ADRUCIL) 4,450 mg in sodium chloride 0.9 % 61 mL chemo infusion  2,400 mg/m2 (Order-Specific) Intravenous 1 day or 1 dose Earlie Server, MD   4,450 mg at 07/05/21 1409     PHYSICAL  EXAMINATION: ECOG PERFORMANCE STATUS: 1 - Symptomatic but completely ambulatory  Physical Exam Constitutional:      General: She is not in acute distress. HENT:     Head: Normocephalic and atraumatic.  Eyes:     General: No scleral icterus. Cardiovascular:     Rate and Rhythm: Normal rate and regular rhythm.     Heart sounds: Normal heart sounds.  Pulmonary:     Effort: Pulmonary effort is normal. No respiratory distress.     Breath sounds: No wheezing.  Abdominal:     General: Bowel sounds are normal. There is no distension.     Palpations: Abdomen is soft.     Comments: + Colostomy bag   Musculoskeletal:        General: No deformity. Normal range of motion.     Cervical back: Normal range of motion and neck supple.  Skin:    General: Skin is warm and dry.     Findings: No erythema or rash.  Neurological:     Mental Status: She is alert and oriented to person, place, and time. Mental status is at baseline.     Cranial Nerves: No cranial nerve deficit.     Coordination: Coordination normal.   CMP Latest Ref Rng & Units 07/05/2021  Glucose 70 - 99 mg/dL 115(H)  BUN 8 - 23 mg/dL 8  Creatinine 0.44 - 1.00 mg/dL 1.06(H)  Sodium 135 - 145 mmol/L 137  Potassium 3.5 - 5.1 mmol/L 3.3(L)  Chloride 98 - 111 mmol/L 101  CO2 22 - 32 mmol/L 27  Calcium 8.9 - 10.3 mg/dL 9.2  Total Protein 6.5 - 8.1 g/dL 7.1  Total Bilirubin 0.3 - 1.2 mg/dL 0.6  Alkaline Phos 38 - 126 U/L 73  AST 15 - 41 U/L 26  ALT 0 - 44 U/L 16   CBC Latest Ref Rng & Units 07/05/2021  WBC 4.0 - 10.5 K/uL 7.0  Hemoglobin 12.0 - 15.0 g/dL 11.5(L)  Hematocrit 36.0 - 46.0 % 36.7  Platelets 150 - 400 K/uL 203    LABORATORY DATA:  I have reviewed the data as listed Lab Results  Component Value Date   WBC 7.0 07/05/2021   HGB 11.5 (L) 07/05/2021   HCT 36.7 07/05/2021   MCV 98.1 07/05/2021   PLT 203 07/05/2021   Recent Labs    07/16/20 0802 07/23/20 1345 06/07/21 0827 06/21/21 1034 06/24/21 0802  07/05/21 0909  NA 140   < >  135 137 140 137  K 3.2*   < > 3.2* 3.6 3.9 3.3*  CL 101   < > 99 101 104 101  CO2 29   < > $R'27 26 28 27  'LZ$ GLUCOSE 114*   < > 101* 104* 84 115*  BUN 9   < > $R'8 9 18 8  'xN$ CREATININE 0.87   < > 0.97 0.86 1.10* 1.06*  CALCIUM 9.2   < > 9.3 9.2 9.4 9.2  GFRNONAA >60   < > >60 >60  --  55*  GFRAA >60  --   --   --   --   --   PROT 6.9   < > 6.9 7.1 5.9* 7.1  ALBUMIN 3.6   < > 4.1 4.1  --  4.1  AST 24   < > $R'28 27 24 26  'NT$ ALT 14   < > $R'20 27 20 16  'Hr$ ALKPHOS 41   < > 79 87  --  73  BILITOT 0.5   < > 0.8 0.2* 0.5 0.6   < > = values in this interval not displayed.    Iron/TIBC/Ferritin/ %Sat    Component Value Date/Time   IRON 40 06/23/2020 1124   TIBC 246 (L) 06/23/2020 1124   FERRITIN 338 (H) 06/23/2020 1124   IRONPCTSAT 16 06/23/2020 1124     RADIOGRAPHIC STUDIES: I have personally reviewed the radiological images as listed and agreed with the findings in the report. CT CHEST ABDOMEN PELVIS W CONTRAST  Result Date: 05/19/2021 CLINICAL DATA:  Rectal cancer restaging, vaginal recurrence, ongoing chemotherapy EXAM: CT CHEST, ABDOMEN, AND PELVIS WITH CONTRAST TECHNIQUE: Multidetector CT imaging of the chest, abdomen and pelvis was performed following the standard protocol during bolus administration of intravenous contrast. CONTRAST:  133mL OMNIPAQUE IOHEXOL 300 MG/ML SOLN, additional oral enteric contrast COMPARISON:  01/19/2021 FINDINGS: CT CHEST FINDINGS Cardiovascular: Right chest port catheter. Aortic atherosclerosis. Normal heart size. Scattered left and right coronary artery calcifications enlargement of the main pulmonary artery measuring up to 3.7 cm in caliber. No pericardial effusion. Mediastinum/Nodes: No enlarged mediastinal, hilar, or axillary lymph nodes. Thyroid gland, trachea, and esophagus demonstrate no significant findings. Lungs/Pleura: Minimal, bandlike scarring of the bilateral lung bases. Interval decrease in size of multiple small bilateral pulmonary  nodules, for example a 5 mm nodule of the medial segment right middle lobe, previously 8 mm (series 4, image 97) and a 2 mm nodule of the posterior right upper lobe, previously 4 mm (series 4, image 48). No pleural effusion or pneumothorax. Musculoskeletal: No chest wall mass or suspicious bone lesions identified. CT ABDOMEN PELVIS FINDINGS Hepatobiliary: No focal liver abnormality is seen. Status post cholecystectomy. Postoperative biliary dilatation. Pancreas: Unremarkable. No pancreatic ductal dilatation or surrounding inflammatory changes. Spleen: Normal in size without significant abnormality. Adrenals/Urinary Tract: Adrenal glands are unremarkable. Multiple bilateral renal cysts. Kidneys are otherwise normal, without renal calculi, solid lesion, or hydronephrosis. Bladder is unremarkable. Stomach/Bowel: Stomach is within normal limits. Appendix appears normal. No evidence of bowel wall thickening, distention, or inflammatory changes. Redemonstrated postoperative findings of abdominoperineal resection with left lower quadrant end colostomy. Diverticulosis of the remnant sigmoid. Vascular/Lymphatic: Aortic atherosclerosis. Previously noted enlargement of bilateral inguinal lymph nodes is resolved, left inguinal node measuring 1.5 x 0.8 cm, previously 2.5 x 1.4 cm (series 2, image 105). Reproductive: Status post hysterectomy. Some appearance of vaginal soft tissue thickening, in keeping with report of vaginal recurrence (series 2, image 114). Other: No abdominal wall hernia or abnormality. No  abdominopelvic ascites. Musculoskeletal: No acute or significant osseous findings. IMPRESSION: 1. Redemonstrated postoperative findings of abdominoperineal resection with left lower quadrant end colostomy. 2. Some appearance of vaginal soft tissue thickening, in keeping with report of vaginal recurrence of rectal malignancy. This would be better assessed by MRI and physical examination. 3. Previously noted enlargement of  bilateral inguinal lymph nodes is resolved, consistent with treatment response of nodal metastatic disease. 4. Interval decrease in size of multiple small bilateral pulmonary nodules, consistent with treatment response of pulmonary metastatic disease. 5. No evidence of new metastatic disease in the chest, abdomen, or pelvis. 6. Coronary artery disease. Aortic Atherosclerosis (ICD10-I70.0). Electronically Signed   By: Eddie Candle M.D.   On: 05/19/2021 10:40      CT CHEST ABDOMEN PELVIS W CONTRAST  Result Date: 05/19/2021 CLINICAL DATA:  Rectal cancer restaging, vaginal recurrence, ongoing chemotherapy EXAM: CT CHEST, ABDOMEN, AND PELVIS WITH CONTRAST TECHNIQUE: Multidetector CT imaging of the chest, abdomen and pelvis was performed following the standard protocol during bolus administration of intravenous contrast. CONTRAST:  159mL OMNIPAQUE IOHEXOL 300 MG/ML SOLN, additional oral enteric contrast COMPARISON:  01/19/2021 FINDINGS: CT CHEST FINDINGS Cardiovascular: Right chest port catheter. Aortic atherosclerosis. Normal heart size. Scattered left and right coronary artery calcifications enlargement of the main pulmonary artery measuring up to 3.7 cm in caliber. No pericardial effusion. Mediastinum/Nodes: No enlarged mediastinal, hilar, or axillary lymph nodes. Thyroid gland, trachea, and esophagus demonstrate no significant findings. Lungs/Pleura: Minimal, bandlike scarring of the bilateral lung bases. Interval decrease in size of multiple small bilateral pulmonary nodules, for example a 5 mm nodule of the medial segment right middle lobe, previously 8 mm (series 4, image 97) and a 2 mm nodule of the posterior right upper lobe, previously 4 mm (series 4, image 48). No pleural effusion or pneumothorax. Musculoskeletal: No chest wall mass or suspicious bone lesions identified. CT ABDOMEN PELVIS FINDINGS Hepatobiliary: No focal liver abnormality is seen. Status post cholecystectomy. Postoperative biliary  dilatation. Pancreas: Unremarkable. No pancreatic ductal dilatation or surrounding inflammatory changes. Spleen: Normal in size without significant abnormality. Adrenals/Urinary Tract: Adrenal glands are unremarkable. Multiple bilateral renal cysts. Kidneys are otherwise normal, without renal calculi, solid lesion, or hydronephrosis. Bladder is unremarkable. Stomach/Bowel: Stomach is within normal limits. Appendix appears normal. No evidence of bowel wall thickening, distention, or inflammatory changes. Redemonstrated postoperative findings of abdominoperineal resection with left lower quadrant end colostomy. Diverticulosis of the remnant sigmoid. Vascular/Lymphatic: Aortic atherosclerosis. Previously noted enlargement of bilateral inguinal lymph nodes is resolved, left inguinal node measuring 1.5 x 0.8 cm, previously 2.5 x 1.4 cm (series 2, image 105). Reproductive: Status post hysterectomy. Some appearance of vaginal soft tissue thickening, in keeping with report of vaginal recurrence (series 2, image 114). Other: No abdominal wall hernia or abnormality. No abdominopelvic ascites. Musculoskeletal: No acute or significant osseous findings. IMPRESSION: 1. Redemonstrated postoperative findings of abdominoperineal resection with left lower quadrant end colostomy. 2. Some appearance of vaginal soft tissue thickening, in keeping with report of vaginal recurrence of rectal malignancy. This would be better assessed by MRI and physical examination. 3. Previously noted enlargement of bilateral inguinal lymph nodes is resolved, consistent with treatment response of nodal metastatic disease. 4. Interval decrease in size of multiple small bilateral pulmonary nodules, consistent with treatment response of pulmonary metastatic disease. 5. No evidence of new metastatic disease in the chest, abdomen, or pelvis. 6. Coronary artery disease. Aortic Atherosclerosis (ICD10-I70.0). Electronically Signed   By: Eddie Candle M.D.   On:  05/19/2021 10:40  ASSESSMENT & PLAN:  1. Metastatic adenocarcinoma ([place])   2. Rectal cancer ([place])   3. Encounter for antineoplastic chemotherapy   4. History of pulmonary embolism   5. Hypokalemia   6. Chemotherapy induced diarrhea   Cancer Staging Rectal cancer [facility]) Staging form: Colon and Rectum, AJCC 8th Edition - Clinical stage from 01/23/2019: Stage IIIC (cT4b, cN1a, cM0) - Signed by Earlie Server, MD on 01/23/2019 - Pathologic stage from 10/06/2019: Stage IIC (ypT4b, pN0, cM0) - Signed by Earlie Server, MD on 10/06/2019 - Pathologic: Stage Unknown (rpTX, pNX, cM1) - Signed by Earlie Server, MD on 01/31/2021  #History of stage IIIC Rectal cancer, s/p TNT, followed by 09/17/19 APR/posterior vaginectomy/TAH/BSO/VY-flap, pT4b pN0 with close vaginal margin 0.2 mm.  Uterus and ovaries negative for malignancy. palliative radiation to vaginal recurrence.  Finished 09/04/2020 01/19/21 recurrence with lung metastasis.-Palliative chemotherapy CEA has decreased.  Labs are reviewed and discussed with patient.  Proceed with FOLFIRI as bevacizumab today. -Neulasta on Day 3, recommend patient to take Claritin for 4 days.   # Post treatment hives, likely due to chemotherapy. She gets additional one dose of IV benadryl $RemoveBef'25mg'VASPPcODfX$  x 1 if needed..  Current  oral benadryl as instructed PRN hive at home.  Symptoms are stable.  #Chemotherapy-induced neutropenia, see above.  On GCSF  #Chemotherapy-induced diarrhea, symptoms are stable.  Continue as needed continue Imodium and or lomotil as needed.Marland Kitchen   #Hypokalemia, potassium 3.3, recommend potassium chloride 20 mEq twice daily  #History of pulmonary embolism, history of bilateral DVT Continue Eliquis 2.5 mg twice daily for anticoagulation prophylaxis.   Follow-up 2 weeks for next cycle of treatment.  Earlie Server, MD, PhD

## 2021-07-07 ENCOUNTER — Inpatient Hospital Stay: Payer: Medicare Other

## 2021-07-07 VITALS — BP 112/60 | HR 68 | Temp 97.2°F | Resp 20

## 2021-07-07 DIAGNOSIS — T451X5A Adverse effect of antineoplastic and immunosuppressive drugs, initial encounter: Secondary | ICD-10-CM | POA: Diagnosis not present

## 2021-07-07 DIAGNOSIS — Z5189 Encounter for other specified aftercare: Secondary | ICD-10-CM | POA: Diagnosis not present

## 2021-07-07 DIAGNOSIS — E7800 Pure hypercholesterolemia, unspecified: Secondary | ICD-10-CM | POA: Diagnosis not present

## 2021-07-07 DIAGNOSIS — Z800 Family history of malignant neoplasm of digestive organs: Secondary | ICD-10-CM | POA: Diagnosis not present

## 2021-07-07 DIAGNOSIS — Z933 Colostomy status: Secondary | ICD-10-CM | POA: Diagnosis not present

## 2021-07-07 DIAGNOSIS — Z86718 Personal history of other venous thrombosis and embolism: Secondary | ICD-10-CM | POA: Diagnosis not present

## 2021-07-07 DIAGNOSIS — Z923 Personal history of irradiation: Secondary | ICD-10-CM | POA: Diagnosis not present

## 2021-07-07 DIAGNOSIS — Z79899 Other long term (current) drug therapy: Secondary | ICD-10-CM | POA: Diagnosis not present

## 2021-07-07 DIAGNOSIS — C20 Malignant neoplasm of rectum: Secondary | ICD-10-CM

## 2021-07-07 DIAGNOSIS — I10 Essential (primary) hypertension: Secondary | ICD-10-CM | POA: Diagnosis not present

## 2021-07-07 DIAGNOSIS — Z86711 Personal history of pulmonary embolism: Secondary | ICD-10-CM | POA: Diagnosis not present

## 2021-07-07 DIAGNOSIS — R197 Diarrhea, unspecified: Secondary | ICD-10-CM | POA: Diagnosis not present

## 2021-07-07 DIAGNOSIS — R609 Edema, unspecified: Secondary | ICD-10-CM | POA: Diagnosis not present

## 2021-07-07 DIAGNOSIS — E876 Hypokalemia: Secondary | ICD-10-CM | POA: Diagnosis not present

## 2021-07-07 DIAGNOSIS — C7982 Secondary malignant neoplasm of genital organs: Secondary | ICD-10-CM | POA: Diagnosis not present

## 2021-07-07 DIAGNOSIS — K219 Gastro-esophageal reflux disease without esophagitis: Secondary | ICD-10-CM | POA: Diagnosis not present

## 2021-07-07 DIAGNOSIS — D702 Other drug-induced agranulocytosis: Secondary | ICD-10-CM | POA: Diagnosis not present

## 2021-07-07 DIAGNOSIS — C799 Secondary malignant neoplasm of unspecified site: Secondary | ICD-10-CM

## 2021-07-07 DIAGNOSIS — Z5111 Encounter for antineoplastic chemotherapy: Secondary | ICD-10-CM | POA: Diagnosis not present

## 2021-07-07 DIAGNOSIS — C7800 Secondary malignant neoplasm of unspecified lung: Secondary | ICD-10-CM | POA: Diagnosis not present

## 2021-07-07 MED ORDER — PEGFILGRASTIM 6 MG/0.6ML ~~LOC~~ PSKT
6.0000 mg | PREFILLED_SYRINGE | Freq: Once | SUBCUTANEOUS | Status: AC
  Administered 2021-07-07: 6 mg via SUBCUTANEOUS
  Filled 2021-07-07: qty 0.6

## 2021-07-07 MED ORDER — HEPARIN SOD (PORK) LOCK FLUSH 100 UNIT/ML IV SOLN
500.0000 [IU] | Freq: Once | INTRAVENOUS | Status: AC | PRN
  Administered 2021-07-07: 500 [IU]
  Filled 2021-07-07: qty 5

## 2021-07-07 MED ORDER — SODIUM CHLORIDE 0.9% FLUSH
10.0000 mL | INTRAVENOUS | Status: DC | PRN
  Administered 2021-07-07: 10 mL
  Filled 2021-07-07: qty 10

## 2021-07-19 ENCOUNTER — Inpatient Hospital Stay: Payer: Medicare Other | Attending: Oncology

## 2021-07-19 ENCOUNTER — Other Ambulatory Visit: Payer: Self-pay

## 2021-07-19 ENCOUNTER — Inpatient Hospital Stay (HOSPITAL_BASED_OUTPATIENT_CLINIC_OR_DEPARTMENT_OTHER): Payer: Medicare Other | Admitting: Oncology

## 2021-07-19 ENCOUNTER — Encounter: Payer: Self-pay | Admitting: Oncology

## 2021-07-19 ENCOUNTER — Inpatient Hospital Stay: Payer: Medicare Other

## 2021-07-19 VITALS — BP 110/71 | HR 70

## 2021-07-19 VITALS — BP 105/72 | HR 86 | Temp 97.8°F | Resp 18 | Wt 160.5 lb

## 2021-07-19 DIAGNOSIS — Z86711 Personal history of pulmonary embolism: Secondary | ICD-10-CM | POA: Diagnosis not present

## 2021-07-19 DIAGNOSIS — D701 Agranulocytosis secondary to cancer chemotherapy: Secondary | ICD-10-CM | POA: Diagnosis not present

## 2021-07-19 DIAGNOSIS — E7800 Pure hypercholesterolemia, unspecified: Secondary | ICD-10-CM | POA: Insufficient documentation

## 2021-07-19 DIAGNOSIS — I700 Atherosclerosis of aorta: Secondary | ICD-10-CM | POA: Diagnosis not present

## 2021-07-19 DIAGNOSIS — Z5111 Encounter for antineoplastic chemotherapy: Secondary | ICD-10-CM | POA: Insufficient documentation

## 2021-07-19 DIAGNOSIS — E876 Hypokalemia: Secondary | ICD-10-CM | POA: Insufficient documentation

## 2021-07-19 DIAGNOSIS — C799 Secondary malignant neoplasm of unspecified site: Secondary | ICD-10-CM | POA: Diagnosis not present

## 2021-07-19 DIAGNOSIS — Z5189 Encounter for other specified aftercare: Secondary | ICD-10-CM | POA: Insufficient documentation

## 2021-07-19 DIAGNOSIS — Z923 Personal history of irradiation: Secondary | ICD-10-CM | POA: Insufficient documentation

## 2021-07-19 DIAGNOSIS — K219 Gastro-esophageal reflux disease without esophagitis: Secondary | ICD-10-CM | POA: Insufficient documentation

## 2021-07-19 DIAGNOSIS — R609 Edema, unspecified: Secondary | ICD-10-CM | POA: Insufficient documentation

## 2021-07-19 DIAGNOSIS — I825Z3 Chronic embolism and thrombosis of unspecified deep veins of distal lower extremity, bilateral: Secondary | ICD-10-CM

## 2021-07-19 DIAGNOSIS — Z79899 Other long term (current) drug therapy: Secondary | ICD-10-CM | POA: Insufficient documentation

## 2021-07-19 DIAGNOSIS — Z5112 Encounter for antineoplastic immunotherapy: Secondary | ICD-10-CM | POA: Diagnosis not present

## 2021-07-19 DIAGNOSIS — I2510 Atherosclerotic heart disease of native coronary artery without angina pectoris: Secondary | ICD-10-CM | POA: Diagnosis not present

## 2021-07-19 DIAGNOSIS — Z86718 Personal history of other venous thrombosis and embolism: Secondary | ICD-10-CM | POA: Diagnosis not present

## 2021-07-19 DIAGNOSIS — Z933 Colostomy status: Secondary | ICD-10-CM | POA: Insufficient documentation

## 2021-07-19 DIAGNOSIS — Z7901 Long term (current) use of anticoagulants: Secondary | ICD-10-CM

## 2021-07-19 DIAGNOSIS — Z8542 Personal history of malignant neoplasm of other parts of uterus: Secondary | ICD-10-CM | POA: Diagnosis not present

## 2021-07-19 DIAGNOSIS — I10 Essential (primary) hypertension: Secondary | ICD-10-CM | POA: Insufficient documentation

## 2021-07-19 DIAGNOSIS — T451X5A Adverse effect of antineoplastic and immunosuppressive drugs, initial encounter: Secondary | ICD-10-CM

## 2021-07-19 DIAGNOSIS — C20 Malignant neoplasm of rectum: Secondary | ICD-10-CM

## 2021-07-19 DIAGNOSIS — Z800 Family history of malignant neoplasm of digestive organs: Secondary | ICD-10-CM | POA: Diagnosis not present

## 2021-07-19 DIAGNOSIS — K521 Toxic gastroenteritis and colitis: Secondary | ICD-10-CM | POA: Diagnosis not present

## 2021-07-19 DIAGNOSIS — Z87891 Personal history of nicotine dependence: Secondary | ICD-10-CM | POA: Insufficient documentation

## 2021-07-19 LAB — CBC WITH DIFFERENTIAL/PLATELET
Abs Immature Granulocytes: 0.1 10*3/uL — ABNORMAL HIGH (ref 0.00–0.07)
Basophils Absolute: 0 10*3/uL (ref 0.0–0.1)
Basophils Relative: 1 %
Eosinophils Absolute: 0.1 10*3/uL (ref 0.0–0.5)
Eosinophils Relative: 2 %
HCT: 37.8 % (ref 36.0–46.0)
Hemoglobin: 12.1 g/dL (ref 12.0–15.0)
Immature Granulocytes: 2 %
Lymphocytes Relative: 31 %
Lymphs Abs: 1.6 10*3/uL (ref 0.7–4.0)
MCH: 30.9 pg (ref 26.0–34.0)
MCHC: 32 g/dL (ref 30.0–36.0)
MCV: 96.4 fL (ref 80.0–100.0)
Monocytes Absolute: 0.6 10*3/uL (ref 0.1–1.0)
Monocytes Relative: 12 %
Neutro Abs: 2.6 10*3/uL (ref 1.7–7.7)
Neutrophils Relative %: 52 %
Platelets: 204 10*3/uL (ref 150–400)
RBC: 3.92 MIL/uL (ref 3.87–5.11)
RDW: 16.9 % — ABNORMAL HIGH (ref 11.5–15.5)
WBC: 5.1 10*3/uL (ref 4.0–10.5)
nRBC: 0.4 % — ABNORMAL HIGH (ref 0.0–0.2)

## 2021-07-19 LAB — COMPREHENSIVE METABOLIC PANEL
ALT: 38 U/L (ref 0–44)
AST: 36 U/L (ref 15–41)
Albumin: 4.4 g/dL (ref 3.5–5.0)
Alkaline Phosphatase: 96 U/L (ref 38–126)
Anion gap: 10 (ref 5–15)
BUN: 9 mg/dL (ref 8–23)
CO2: 25 mmol/L (ref 22–32)
Calcium: 9.4 mg/dL (ref 8.9–10.3)
Chloride: 100 mmol/L (ref 98–111)
Creatinine, Ser: 1.11 mg/dL — ABNORMAL HIGH (ref 0.44–1.00)
GFR, Estimated: 52 mL/min — ABNORMAL LOW (ref >60)
Glucose, Bld: 110 mg/dL — ABNORMAL HIGH (ref 70–99)
Potassium: 3.3 mmol/L — ABNORMAL LOW (ref 3.5–5.1)
Sodium: 135 mmol/L (ref 135–145)
Total Bilirubin: 0.5 mg/dL (ref 0.3–1.2)
Total Protein: 7.4 g/dL (ref 6.5–8.1)

## 2021-07-19 LAB — PROTEIN, URINE, RANDOM: Total Protein, Urine: 46 mg/dL

## 2021-07-19 MED ORDER — MONTELUKAST SODIUM 10 MG PO TABS
10.0000 mg | ORAL_TABLET | Freq: Once | ORAL | Status: AC
  Administered 2021-07-19: 10 mg via ORAL
  Filled 2021-07-19: qty 1

## 2021-07-19 MED ORDER — DIPHENHYDRAMINE HCL 50 MG/ML IJ SOLN
25.0000 mg | Freq: Once | INTRAMUSCULAR | Status: AC
  Administered 2021-07-19: 25 mg via INTRAVENOUS
  Filled 2021-07-19: qty 1

## 2021-07-19 MED ORDER — POTASSIUM CHLORIDE 20 MEQ/100ML IV SOLN
20.0000 meq | Freq: Once | INTRAVENOUS | Status: AC
  Administered 2021-07-19: 20 meq via INTRAVENOUS

## 2021-07-19 MED ORDER — PALONOSETRON HCL INJECTION 0.25 MG/5ML
0.2500 mg | Freq: Once | INTRAVENOUS | Status: AC
Start: 2021-07-19 — End: 2021-07-19
  Administered 2021-07-19: 0.25 mg via INTRAVENOUS
  Filled 2021-07-19: qty 5

## 2021-07-19 MED ORDER — FLUOROURACIL CHEMO INJECTION 2.5 GM/50ML
400.0000 mg/m2 | Freq: Once | INTRAVENOUS | Status: AC
  Administered 2021-07-19: 750 mg via INTRAVENOUS
  Filled 2021-07-19: qty 15

## 2021-07-19 MED ORDER — ATROPINE SULFATE 1 MG/ML IJ SOLN
0.5000 mg | Freq: Once | INTRAMUSCULAR | Status: AC
  Administered 2021-07-19: 0.5 mg via INTRAVENOUS
  Filled 2021-07-19: qty 1

## 2021-07-19 MED ORDER — SODIUM CHLORIDE 0.9 % IV SOLN
Freq: Once | INTRAVENOUS | Status: DC
  Filled 2021-07-19: qty 250

## 2021-07-19 MED ORDER — APIXABAN 2.5 MG PO TABS: 2.5000 mg | ORAL_TABLET | Freq: Two times a day (BID) | ORAL | 2 refills | Status: DC

## 2021-07-19 MED ORDER — SODIUM CHLORIDE 0.9 % IV SOLN
5.0000 mg/kg | Freq: Once | INTRAVENOUS | Status: AC
  Administered 2021-07-19: 400 mg via INTRAVENOUS
  Filled 2021-07-19: qty 16

## 2021-07-19 MED ORDER — SODIUM CHLORIDE 0.9 % IV SOLN
135.0000 mg/m2 | Freq: Once | INTRAVENOUS | Status: AC
  Administered 2021-07-19: 240 mg via INTRAVENOUS
  Filled 2021-07-19: qty 10

## 2021-07-19 MED ORDER — SODIUM CHLORIDE 0.9 % IV SOLN
10.0000 mg | Freq: Once | INTRAVENOUS | Status: AC
  Administered 2021-07-19: 10 mg via INTRAVENOUS
  Filled 2021-07-19: qty 10

## 2021-07-19 MED ORDER — SODIUM CHLORIDE 0.9 % IV SOLN
750.0000 mg | Freq: Once | INTRAVENOUS | Status: AC
  Administered 2021-07-19: 750 mg via INTRAVENOUS
  Filled 2021-07-19: qty 37.5

## 2021-07-19 MED ORDER — SODIUM CHLORIDE 0.9 % IV SOLN
Freq: Once | INTRAVENOUS | Status: AC
  Filled 2021-07-19: qty 250

## 2021-07-19 MED ORDER — FLUOROURACIL CHEMO INJECTION 5 GM/100ML
2400.0000 mg/m2 | INTRAVENOUS | Status: DC
  Administered 2021-07-19: 4350 mg via INTRAVENOUS
  Filled 2021-07-19: qty 87

## 2021-07-19 NOTE — Patient Instructions (Signed)
[facility] [place] [facility]  Discharge Instructions: Thank you for choosing [facility] to provide your oncology and hematology care.  If you have a lab appointment with the Cancer Center, please go directly to the Cancer Center and check in at the registration area.  Wear comfortable clothing and clothing appropriate for easy access to any Portacath or PICC line.   We strive to give you quality time with your provider. You may need to reschedule your appointment if you arrive late (15 or more minutes).  Arriving late affects you and other patients whose appointments are after yours.  Also, if you miss three or more appointments without notifying the office, you may be dismissed from the clinic at the provider's discretion.      For prescription refill requests, have your pharmacy contact our office and allow 72 hours for refills to be completed.      To help prevent nausea and vomiting after your treatment, we encourage you to take your nausea medication as directed.  BELOW ARE SYMPTOMS THAT SHOULD BE REPORTED IMMEDIATELY: *FEVER GREATER THAN 100.4 F (38 C) OR HIGHER *CHILLS OR SWEATING *NAUSEA AND VOMITING THAT IS NOT CONTROLLED WITH YOUR NAUSEA MEDICATION *UNUSUAL SHORTNESS OF BREATH *UNUSUAL BRUISING OR BLEEDING *URINARY PROBLEMS (pain or burning when urinating, or frequent urination) *BOWEL PROBLEMS (unusual diarrhea, constipation, pain near the anus) TENDERNESS IN MOUTH AND THROAT WITH OR WITHOUT PRESENCE OF ULCERS (sore throat, sores in mouth, or a toothache) UNUSUAL RASH, SWELLING OR PAIN  UNUSUAL VAGINAL DISCHARGE OR ITCHING   Items with * indicate a potential emergency and should be followed up as soon as possible or go to the Emergency Department if any problems should occur.  Please show the CHEMOTHERAPY ALERT CARD or IMMUNOTHERAPY ALERT CARD at check-in to the Emergency Department and triage nurse.  Should you have questions after your  visit or need to cancel or reschedule your appointment, please contact [facility] [place] [facility]  [phone] and follow the prompts.  Office hours are 8:00 a.m. to 4:30 p.m. Monday - Friday. Please note that voicemails left after 4:00 p.m. may not be returned until the following business day.  We are closed weekends and major holidays. You have access to a nurse at all times for urgent questions. Please call the main number to the clinic [phone] and follow the prompts.  For any non-urgent questions, you may also contact your provider using MyChart. We now offer e-Visits for anyone 18 and older to request care online for non-urgent symptoms. For details visit [email or web address].   Also download the MyChart app! Go to the app store, search "MyChart", open the app, select [facility], and log in with your MyChart username and password.  Due to Covid, a mask is required upon entering the hospital/clinic. If you do not have a mask, one will be given to you upon arrival. For doctor visits, patients may have 1 support person aged 18 or older with them. For treatment visits, patients cannot have anyone with them due to current Covid guidelines and our immunocompromised population.  

## 2021-07-19 NOTE — Progress Notes (Signed)
Hematology/Oncology progress note [facility] [phone] [phone]   Patient Care Team: Karamalegos, Alexander J, DO as PCP - General (Family Medicine) Stanton, Kristi D, RN as Registered Nurse Fernando Stoiber, MD as Consulting Physician (Hematology and Oncology)  CHIEF COMPLAINTS/REASON FOR VISIT:  Follow up for rectal cancer  HISTORY OF PRESENTING ILLNESS:  Patient initially presented with complaints of postmenopausal bleeding on 08/16/2018.  History of was menopausal vaginal bleeding in 2016 which resulted in cervical polypectomy.  Pathology 02/04/2015 showed cervical polyp, consistent with benign endometrial polyp.  [name] after polypectomy due to anxiety associated with pelvic exams.  pelvic exam on 08/16/2018 reviewed cervical abnormality and from enlarged uterus. Seen by Dr. Cherry on 10/29/2018.  Endometrial biopsy and a Pap smear was performed. 10/29/2018 Pap smear showed adenocarcinoma, favor endometrial origin. 10/29/2018 endometrial biopsy showed endometrioid carcinoma, FIGO grade 1.  10/29/2018- TA & TV Ultrasound revealed: Anteverted uterus measuring 8.7 x 5.6 x 6.4 cm without evidence of focal masses.  The endometrium measuring 24.1 mm (thickened) and heterogeneous.  Right and left ovaries not visualized.  No adnexal masses identified.  No free fluid in cul-de-sac.  Patient was seen by Dr. Secord in clinic on 11/13/2018.  Cervical exam reveals 2 cm exophytic irregular mass consistent with malignancy.   11/19/2018 CT chest abdomen pelvis with contrast showed thickened endometrium with some irregularity compatible with the provided diagnosis of endometrial malignancy.  There is a mildly prominent left inguinal node 1.4 cm.  Patient was seen by Dr. Berchuck on 11/20/2018 and left groin lymph node biopsy was recommended.  11/26/2018 patient underwent left inguinal lymph node biopsy. Pathology showed metastatic adenocarcinoma consistent  with colorectal origin.  CDX 2+.  Case was discussed on tumor board.  Recommend colonoscopy for further evaluation.  Patient reports significant weight loss 30 pounds over the last year.  Chronic vaginal spotting. Change of bowel habits the past few months.  More constipated.  Family history positive for brother who has colon cancer prostate cancer.  patient has underwent colonoscopy on 12/03/2018 which reviewed a nonobstructing large mass in the rectum.  Also chronic fistula.  Mass was not circumferential.  This was biopsied with a cold forceps for histology.  Pathology came back hyperplastic polyp negative for dysplasia and malignancy. [name], patient underwent flex sigmoidoscopy on 12/06/2018 with rebiopsy of the rectal mass. This time biopsy results came back positive for invasive colorectal adenocarcinoma, moderately differentiated. Immunotherapy for nearly mismatch repair protein (MMR ) was performed.  There is no loss of MMR expression.  low probability of MSI high.   # Seen by [name] for evaluation of resectability for rectal cancer. In addition, she also had a second opinion with [facility] pathology where her endometrial biopsy pathology was changed to  adenocarcinoma, consistent with colorectal primary.   [name]. She has home health that has been assisting with ostomy care  Patient was also evaluated by [facility] oncology.  Recommendation is to proceed with TNT with concurrent chemoradiation followed by neoadjuvant chemotherapy followed by surgical resection. Patient prefers to have treatment done locally with [facility].   # Oncology Treatment:  02/03/2019- 03/19/2019  concurrent Xeloda and radiation.  Xeloda dose 825mg /m2 BID - rounded to 1650mg BID- on days of radiation. 04/09/2019, started on FOLFOX with bolus early.  Omitted.  07/16/2019 finished 8 cycles of FOLFOX. 09/17/19 APR/posterior vaginectomy/TAH/BSO/VY-flap pT4b pN0  with close vaginal margin 0.2 mm.  Uterus and ovaries   negative for malignancy. Patient reports bilateral lower extremity numbness and tingling, intermittent, left worse than right. She has lost a lot of weight since her APR surgery.   #Family history with half brother having's history of colon cancer prostate cancer.  Personal history of colorectal cancer.  Patient has not decided if she wants genetic testing.    # history of PE( 01/13/2020)  in the bilateral lower extremity DVT (01/13/2020).   She finishes 6 months of anticoagulation with Eliquis 5 mg twice daily. Now switched to Eliquis 2.5 mg twice daily..  # She has now developed recurrent disease. #06/30/20  vaginal introitus mass biopsied. Pathology is consistent with metastatic colorectal adenocarcinoma I have discussed with [name]  Dr. Luciano Cutter and the mass is not resectable. Patient has also had colonoscopy by Dr. Tobi Bastos yesterday. Normal examination. # 07/16/2020 cycle 1 FOLFIRI  # 07/20/2020 PET scan was done for further evaluation, images are consistent with local recurrence, no distant metastasis. #Discussed with radiation oncology Dr. Rushie Chestnut will recommends concurrent chemotherapy and radiation. 08/02/2020-08/16/2020, patient starts radiation.  Xeloda was held due to neutropenia 08/17/2020,-09/06/2020 Xeloda 1500 mg twice daily concurrently with radiation  01/31/21 started on FOLFIRI + Bev 05/18/2021 CT chest abdomen pelvis showed Previously noted enlargement of bilateral inguinal lymph nodes is resolved, consistent with treatment response of nodal metastatic disease. Interval decrease in size of multiple small bilateral pulmonary nodules, consistent with treatment response of pulmonary metastatic disease. No evidence of new metastatic disease.  INTERVAL HISTORY Jacqueline Yoder is a 75 y.o. female who has above history reviewed by me presents for follow-up of rectal cancer. She tolerates chemotherapy well.  She denies nausea  vomiting. She has had intermittent loose bowel movements, usually after meals. She takes PRN anti-diarrhea medication.  patient has a colostomy bag.   She has lost 6 pounds. No nausea, vomiting   Review of Systems  Constitutional:  Negative for appetite change, chills, fatigue, fever and unexpected weight change.  HENT:   Negative for hearing loss and voice change.   Eyes:  Negative for eye problems.  Respiratory:  Negative for chest tightness and cough.   Cardiovascular:  Negative for chest pain.  Gastrointestinal:  Positive for diarrhea. Negative for abdominal distention, abdominal pain, blood in stool, constipation and nausea.  Endocrine: Negative for hot flashes.  Genitourinary:  Negative for difficulty urinating and frequency.   Musculoskeletal:  Negative for arthralgias.  Skin:  Negative for itching and rash.  Neurological:  Negative for extremity weakness and numbness.  Hematological:  Negative for adenopathy.  Psychiatric/Behavioral:  Negative for confusion.    MEDICAL HISTORY:  Past Medical History:  Diagnosis Date   Allergy    Arthritis    Blood clot in vein    Family history of colon cancer    GERD (gastroesophageal reflux disease)    Hypercholesteremia    Hypertension    Hypertension    Lower extremity edema    Rectal cancer (HCC) 12/2018   Urinary incontinence     SURGICAL HISTORY: Past Surgical History:  Procedure Laterality Date   ABDOMINAL HYSTERECTOMY     CHOLECYSTECTOMY  1971   COLONOSCOPY WITH PROPOFOL N/A 12/03/2018   Procedure: COLONOSCOPY WITH PROPOFOL;  Surgeon: Midge Minium, MD;  Location: [facility] ENDOSCOPY;  Service: Endoscopy;  Laterality: N/A;   COLONOSCOPY WITH PROPOFOL N/A 07/15/2020   Procedure: COLONOSCOPY WITH PROPOFOL;  Surgeon: Wyline Mood, MD;  Location: [facility] ENDOSCOPY;  Service: Gastroenterology;  Laterality: N/A;   FLEXIBLE SIGMOIDOSCOPY N/A 12/06/2018  Procedure: FLEXIBLE SIGMOIDOSCOPY;  Surgeon: [name], MD;  Location: [facility]  ENDOSCOPY;  Service: Endoscopy;  Laterality: N/A;   LAPAROSCOPIC COLOSTOMY  01/06/2019   PORTACATH PLACEMENT N/A 04/03/2019   Procedure: INSERTION PORT-A-CATH;  Surgeon: Jules Husbands, MD;  Location: [facility] ORS;  Service: General;  Laterality: N/A;    SOCIAL HISTORY: Social History   Socioeconomic History   Marital status: Widowed    Spouse name: Not on file   Number of children: Not on file   Years of education: Not on file   Highest education level: Not on file  Occupational History   Not on file  Tobacco Use   Smoking status: Former    Types: Cigarettes    Quit date: 12/02/1977    Years since quitting: 43.6   Smokeless tobacco: Former  Scientific laboratory technician Use: Never used  Substance and Sexual Activity   Alcohol use: Never   Drug use: Never   Sexual activity: Not Currently    Birth control/protection: None  Other Topics Concern   Not on file  Social History Narrative   Lives with daughter   Social Determinants of Health   Financial Resource Strain: Low Risk    Difficulty of Paying Living Expenses: Not hard at all  Food Insecurity: No Food Insecurity   Worried About Charity fundraiser in the Last Year: Never true   [place] in the Last Year: Never true  Transportation Needs: No Transportation Needs   Lack of Transportation (Medical): No   Lack of Transportation (Non-Medical): No  Physical Activity: Inactive   Days of Exercise per Week: 0 days   Minutes of Exercise per Session: 0 min  Stress: No Stress Concern Present   Feeling of Stress : Not at all  Social Connections: Not on file  Intimate Partner Violence: Not on file    FAMILY HISTORY: Family History  Problem Relation Age of Onset   Colon cancer Brother 63       exposure to chemicals [place]   Hypertension Mother    Stroke Mother    Kidney failure Father    Breast cancer Neg Hx    Ovarian cancer Neg Hx     ALLERGIES:  is allergic to sulfamethoxazole-trimethoprim.  MEDICATIONS:  Current  Outpatient Medications  Medication Sig Dispense Refill   Cholecalciferol (VITAMIN D3) 2000 units capsule Take 2,000 Units by mouth daily.     diclofenac sodium (VOLTAREN) 1 % GEL Apply 2 g topically 4 (four) times daily as needed (joint pain).  11   diphenoxylate-atropine (LOMOTIL) 2.5-0.025 MG tablet Take 1 tablet by mouth 4 (four) times daily as needed for diarrhea or loose stools. 30 tablet 1   ELIQUIS 2.5 MG TABS tablet Take 1 tablet by mouth twice daily 60 tablet 0   fluticasone (FLONASE) 50 MCG/ACT nasal spray Use 1 spray(s) in each nostril once daily 16 g 1   gabapentin (NEURONTIN) 100 MG capsule Take 1 capsule (100 mg total) by mouth at bedtime. 30 capsule 0   ipratropium (ATROVENT) 0.03 % nasal spray Place 1 spray into both nostrils 2 (two) times daily. 30 mL 2   lidocaine-prilocaine (EMLA) cream Apply 1 application topically as needed. 30 g 6   loperamide (IMODIUM) 2 MG capsule Take 1 capsule (2 mg total) by mouth See admin instructions. With onset of loose stool, take $RemoveBef'4mg'wpsWzkTgig$  followed by $RemoveBef'2mg'nEtlhMUSGa$  every 2 hours,  Maximum: 16 mg/day 120 capsule 1   loratadine (CLARITIN) 10 MG tablet  Take 10 mg by mouth daily.     Multiple Vitamins-Minerals (ONE-A-DAY WOMENS 50 PLUS PO) Take 1 tablet by mouth daily.      ondansetron (ZOFRAN) 8 MG tablet Take 1 tablet (8 mg total) by mouth 2 (two) times daily as needed for refractory nausea / vomiting. Start on day 3 after chemotherapy. 30 tablet 1   potassium chloride SA (KLOR-CON) 20 MEQ tablet Take 1 tablet (20 mEq total) by mouth 2 (two) times daily. 90 tablet 3   prochlorperazine (COMPAZINE) 10 MG tablet Take 1 tablet (10 mg total) by mouth every 6 (six) hours as needed (NAUSEA). 30 tablet 1   simvastatin (ZOCOR) 40 MG tablet Take 1 tablet (40 mg total) by mouth at bedtime. 90 tablet 3   triamterene-hydrochlorothiazide (DYAZIDE) 37.5-25 MG capsule Take 1 each (1 capsule total) by mouth daily. 90 capsule 3   zinc gluconate 50 MG tablet Take 50 mg by mouth daily.      No current facility-administered medications for this visit.     PHYSICAL EXAMINATION: ECOG PERFORMANCE STATUS: 1 - Symptomatic but completely ambulatory  Physical Exam Constitutional:      General: She is not in acute distress. HENT:     Head: Normocephalic and atraumatic.  Eyes:     General: No scleral icterus. Cardiovascular:     Rate and Rhythm: Normal rate and regular rhythm.     Heart sounds: Normal heart sounds.  Pulmonary:     Effort: Pulmonary effort is normal. No respiratory distress.     Breath sounds: No wheezing.  Abdominal:     General: Bowel sounds are normal. There is no distension.     Palpations: Abdomen is soft.     Comments: + Colostomy bag   Musculoskeletal:        General: No deformity. Normal range of motion.     Cervical back: Normal range of motion and neck supple.  Skin:    General: Skin is warm and dry.     Findings: No erythema or rash.  Neurological:     Mental Status: She is alert and oriented to person, place, and time. Mental status is at baseline.     Cranial Nerves: No cranial nerve deficit.     Coordination: Coordination normal.   CMP Latest Ref Rng & Units 07/19/2021  Glucose 70 - 99 mg/dL 110(H)  BUN 8 - 23 mg/dL 9  Creatinine 0.44 - 1.00 mg/dL 1.11(H)  Sodium 135 - 145 mmol/L 135  Potassium 3.5 - 5.1 mmol/L 3.3(L)  Chloride 98 - 111 mmol/L 100  CO2 22 - 32 mmol/L 25  Calcium 8.9 - 10.3 mg/dL 9.4  Total Protein 6.5 - 8.1 g/dL 7.4  Total Bilirubin 0.3 - 1.2 mg/dL 0.5  Alkaline Phos 38 - 126 U/L 96  AST 15 - 41 U/L 36  ALT 0 - 44 U/L 38   CBC Latest Ref Rng & Units 07/19/2021  WBC 4.0 - 10.5 K/uL 5.1  Hemoglobin 12.0 - 15.0 g/dL 12.1  Hematocrit 36.0 - 46.0 % 37.8  Platelets 150 - 400 K/uL 204    LABORATORY DATA:  I have reviewed the data as listed Lab Results  Component Value Date   WBC 5.1 07/19/2021   HGB 12.1 07/19/2021   HCT 37.8 07/19/2021   MCV 96.4 07/19/2021   PLT 204 07/19/2021   Recent Labs     06/21/21 1034 06/24/21 0802 07/05/21 0909 07/19/21 0754  NA 137 140 137 135  K 3.6 3.9 3.3*  3.3*  CL 101 104 101 100  CO2 $Re'26 28 27 25  'nqp$ GLUCOSE 104* 84 115* 110*  BUN $Re'9 18 8 9  'CCi$ CREATININE 0.86 1.10* 1.06* 1.11*  CALCIUM 9.2 9.4 9.2 9.4  GFRNONAA >60  --  55* 52*  PROT 7.1 5.9* 7.1 7.4  ALBUMIN 4.1  --  4.1 4.4  AST $Re'27 24 26 'sKh$ 36  ALT $Re'27 20 16 'tVP$ 38  ALKPHOS 87  --  73 96  BILITOT 0.2* 0.5 0.6 0.5    Iron/TIBC/Ferritin/ %Sat    Component Value Date/Time   IRON 40 06/23/2020 1124   TIBC 246 (L) 06/23/2020 1124   FERRITIN 338 (H) 06/23/2020 1124   IRONPCTSAT 16 06/23/2020 1124     RADIOGRAPHIC STUDIES: I have personally reviewed the radiological images as listed and agreed with the findings in the report. CT CHEST ABDOMEN PELVIS W CONTRAST  Result Date: 05/19/2021 CLINICAL DATA:  Rectal cancer restaging, vaginal recurrence, ongoing chemotherapy EXAM: CT CHEST, ABDOMEN, AND PELVIS WITH CONTRAST TECHNIQUE: Multidetector CT imaging of the chest, abdomen and pelvis was performed following the standard protocol during bolus administration of intravenous contrast. CONTRAST:  156mL OMNIPAQUE IOHEXOL 300 MG/ML SOLN, additional oral enteric contrast COMPARISON:  01/19/2021 FINDINGS: CT CHEST FINDINGS Cardiovascular: Right chest port catheter. Aortic atherosclerosis. Normal heart size. Scattered left and right coronary artery calcifications enlargement of the main pulmonary artery measuring up to 3.7 cm in caliber. No pericardial effusion. Mediastinum/Nodes: No enlarged mediastinal, hilar, or axillary lymph nodes. Thyroid gland, trachea, and esophagus demonstrate no significant findings. Lungs/Pleura: Minimal, bandlike scarring of the bilateral lung bases. Interval decrease in size of multiple small bilateral pulmonary nodules, for example a 5 mm nodule of the medial segment right middle lobe, previously 8 mm (series 4, image 97) and a 2 mm nodule of the posterior right upper lobe, previously 4 mm  (series 4, image 48). No pleural effusion or pneumothorax. Musculoskeletal: No chest wall mass or suspicious bone lesions identified. CT ABDOMEN PELVIS FINDINGS Hepatobiliary: No focal liver abnormality is seen. Status post cholecystectomy. Postoperative biliary dilatation. Pancreas: Unremarkable. No pancreatic ductal dilatation or surrounding inflammatory changes. Spleen: Normal in size without significant abnormality. Adrenals/Urinary Tract: Adrenal glands are unremarkable. Multiple bilateral renal cysts. Kidneys are otherwise normal, without renal calculi, solid lesion, or hydronephrosis. Bladder is unremarkable. Stomach/Bowel: Stomach is within normal limits. Appendix appears normal. No evidence of bowel wall thickening, distention, or inflammatory changes. Redemonstrated postoperative findings of abdominoperineal resection with left lower quadrant end colostomy. Diverticulosis of the remnant sigmoid. Vascular/Lymphatic: Aortic atherosclerosis. Previously noted enlargement of bilateral inguinal lymph nodes is resolved, left inguinal node measuring 1.5 x 0.8 cm, previously 2.5 x 1.4 cm (series 2, image 105). Reproductive: Status post hysterectomy. Some appearance of vaginal soft tissue thickening, in keeping with report of vaginal recurrence (series 2, image 114). Other: No abdominal wall hernia or abnormality. No abdominopelvic ascites. Musculoskeletal: No acute or significant osseous findings. IMPRESSION: 1. Redemonstrated postoperative findings of abdominoperineal resection with left lower quadrant end colostomy. 2. Some appearance of vaginal soft tissue thickening, in keeping with report of vaginal recurrence of rectal malignancy. This would be better assessed by MRI and physical examination. 3. Previously noted enlargement of bilateral inguinal lymph nodes is resolved, consistent with treatment response of nodal metastatic disease. 4. Interval decrease in size of multiple small bilateral pulmonary nodules,  consistent with treatment response of pulmonary metastatic disease. 5. No evidence of new metastatic disease in the chest, abdomen, or pelvis. 6. Coronary artery disease. Aortic  Atherosclerosis (ICD10-I70.0). Electronically Signed   By: Eddie Candle M.D.   On: 05/19/2021 10:40      CT CHEST ABDOMEN PELVIS W CONTRAST  Result Date: 05/19/2021 CLINICAL DATA:  Rectal cancer restaging, vaginal recurrence, ongoing chemotherapy EXAM: CT CHEST, ABDOMEN, AND PELVIS WITH CONTRAST TECHNIQUE: Multidetector CT imaging of the chest, abdomen and pelvis was performed following the standard protocol during bolus administration of intravenous contrast. CONTRAST:  120mL OMNIPAQUE IOHEXOL 300 MG/ML SOLN, additional oral enteric contrast COMPARISON:  01/19/2021 FINDINGS: CT CHEST FINDINGS Cardiovascular: Right chest port catheter. Aortic atherosclerosis. Normal heart size. Scattered left and right coronary artery calcifications enlargement of the main pulmonary artery measuring up to 3.7 cm in caliber. No pericardial effusion. Mediastinum/Nodes: No enlarged mediastinal, hilar, or axillary lymph nodes. Thyroid gland, trachea, and esophagus demonstrate no significant findings. Lungs/Pleura: Minimal, bandlike scarring of the bilateral lung bases. Interval decrease in size of multiple small bilateral pulmonary nodules, for example a 5 mm nodule of the medial segment right middle lobe, previously 8 mm (series 4, image 97) and a 2 mm nodule of the posterior right upper lobe, previously 4 mm (series 4, image 48). No pleural effusion or pneumothorax. Musculoskeletal: No chest wall mass or suspicious bone lesions identified. CT ABDOMEN PELVIS FINDINGS Hepatobiliary: No focal liver abnormality is seen. Status post cholecystectomy. Postoperative biliary dilatation. Pancreas: Unremarkable. No pancreatic ductal dilatation or surrounding inflammatory changes. Spleen: Normal in size without significant abnormality. Adrenals/Urinary Tract: Adrenal  glands are unremarkable. Multiple bilateral renal cysts. Kidneys are otherwise normal, without renal calculi, solid lesion, or hydronephrosis. Bladder is unremarkable. Stomach/Bowel: Stomach is within normal limits. Appendix appears normal. No evidence of bowel wall thickening, distention, or inflammatory changes. Redemonstrated postoperative findings of abdominoperineal resection with left lower quadrant end colostomy. Diverticulosis of the remnant sigmoid. Vascular/Lymphatic: Aortic atherosclerosis. Previously noted enlargement of bilateral inguinal lymph nodes is resolved, left inguinal node measuring 1.5 x 0.8 cm, previously 2.5 x 1.4 cm (series 2, image 105). Reproductive: Status post hysterectomy. Some appearance of vaginal soft tissue thickening, in keeping with report of vaginal recurrence (series 2, image 114). Other: No abdominal wall hernia or abnormality. No abdominopelvic ascites. Musculoskeletal: No acute or significant osseous findings. IMPRESSION: 1. Redemonstrated postoperative findings of abdominoperineal resection with left lower quadrant end colostomy. 2. Some appearance of vaginal soft tissue thickening, in keeping with report of vaginal recurrence of rectal malignancy. This would be better assessed by MRI and physical examination. 3. Previously noted enlargement of bilateral inguinal lymph nodes is resolved, consistent with treatment response of nodal metastatic disease. 4. Interval decrease in size of multiple small bilateral pulmonary nodules, consistent with treatment response of pulmonary metastatic disease. 5. No evidence of new metastatic disease in the chest, abdomen, or pelvis. 6. Coronary artery disease. Aortic Atherosclerosis (ICD10-I70.0). Electronically Signed   By: Eddie Candle M.D.   On: 05/19/2021 10:40     ASSESSMENT & PLAN:  1. Encounter for antineoplastic chemotherapy   2. History of pulmonary embolism   3. Rectal cancer ([place])   4. Chemotherapy induced diarrhea   5.  Chemotherapy induced neutropenia (HCC)   6. Current use of long term anticoagulation   7. Chronic deep vein thrombosis (DVT) of distal vein of both lower extremities (HCC)   8. Hypokalemia   Cancer Staging Rectal cancer [facility]) Staging form: Colon and Rectum, AJCC 8th Edition - Clinical stage from 01/23/2019: Stage IIIC (cT4b, cN1a, cM0) - Signed by Earlie Server, MD on 01/23/2019 - Pathologic stage from 10/06/2019: Stage IIC (  ypT4b, pN0, cM0) - Signed by Earlie Server, MD on 10/06/2019 - Pathologic: Stage Unknown (rpTX, pNX, cM1) - Signed by Earlie Server, MD on 01/31/2021  #History of stage IIIC Rectal cancer, s/p TNT, followed by 09/17/19 APR/posterior vaginectomy/TAH/BSO/VY-flap, pT4b pN0 with close vaginal margin 0.2 mm.  Uterus and ovaries negative for malignancy. palliative radiation to vaginal recurrence.  Finished 09/04/2020 01/19/21 recurrence with lung metastasis.-Palliative  05/18/2021 positive treatment response. CEA has decreased.- 419 Labs are reviewed and discussed with patient. Proceed with FOLFIRI as bevacizumab today- dose reduce Irinotecan to $RemoveBefor'125mg'hXTzxgaamKiw$ /m2 Neulasta on Day 3,Claritin for 4 days.   # Post treatment hives, likely due to chemotherapy. Symptoms are better.  She gets additional one dose of IV benadryl $RemoveBef'25mg'qkCCtFntFT$  x 1 if needed..  Current  oral benadryl as instructed PRN hive at home.   #Chemotherapy-induced neutropenia, see above.  On GCSF  #Chemotherapy-induced diarrhea, symptoms are stable.  Continue as needed continue Imodium or lomotil as needed..  Dose reduced Irinotecan.   #Hypokalemia, potassium 3.3, recommend potassium chloride 20 mEq twice daily. IV KCL 42meq x 1 today  #History of pulmonary embolism, history of bilateral DVT Continue Eliquis 2.5 mg twice daily for anticoagulation prophylaxis.   Follow-up 2 weeks for next cycle of treatment.  Earlie Server, MD, PhD

## 2021-07-19 NOTE — Progress Notes (Signed)
Pt here for follow up. No new concerns voiced.   

## 2021-07-19 NOTE — Progress Notes (Signed)
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   Met with patient during infusion.  Reports continued diarrhea (ostomy output).  Patient reports that MD planning to reduce chemotherapy dose today to help with diarrhea.  Says that she has been eating applesauce, bananas, potatoes, etc to help ease diarrhea.  Likes ensure and tries to drink 3 a day.      Medications: reviewed  Labs: glucose 110  Anthropometrics:   Weight 160 lb today decreased  166 lb 3.2 oz on 9/20 170 lb on 7/26 168 lb on 6/14   NUTRITION DIAGNOSIS: Altered GI function continues   INTERVENTION:  Continue choosing foods to help with diarrhea Continue ensure plus TID for added nutrition Utilize antidiarrheal medication    MONITORING, EVALUATION, GOAL: weight trends, intake   NEXT VISIT: Tuesday, Oct 18 during infusion  Danner Paulding B. Zenia Resides, [place], [place] Registered Dietitian [phone] (mobile)

## 2021-07-20 LAB — CEA: CEA: 290 ng/mL — ABNORMAL HIGH (ref 0.0–4.7)

## 2021-07-21 ENCOUNTER — Inpatient Hospital Stay: Payer: Medicare Other

## 2021-07-21 VITALS — BP 121/77 | HR 70 | Temp 98.0°F | Resp 18

## 2021-07-21 DIAGNOSIS — Z923 Personal history of irradiation: Secondary | ICD-10-CM | POA: Diagnosis not present

## 2021-07-21 DIAGNOSIS — Z933 Colostomy status: Secondary | ICD-10-CM | POA: Diagnosis not present

## 2021-07-21 DIAGNOSIS — Z86718 Personal history of other venous thrombosis and embolism: Secondary | ICD-10-CM | POA: Diagnosis not present

## 2021-07-21 DIAGNOSIS — Z95828 Presence of other vascular implants and grafts: Secondary | ICD-10-CM

## 2021-07-21 DIAGNOSIS — Z86711 Personal history of pulmonary embolism: Secondary | ICD-10-CM | POA: Diagnosis not present

## 2021-07-21 DIAGNOSIS — Z800 Family history of malignant neoplasm of digestive organs: Secondary | ICD-10-CM | POA: Diagnosis not present

## 2021-07-21 DIAGNOSIS — I700 Atherosclerosis of aorta: Secondary | ICD-10-CM | POA: Diagnosis not present

## 2021-07-21 DIAGNOSIS — I10 Essential (primary) hypertension: Secondary | ICD-10-CM | POA: Diagnosis not present

## 2021-07-21 DIAGNOSIS — Z7901 Long term (current) use of anticoagulants: Secondary | ICD-10-CM | POA: Diagnosis not present

## 2021-07-21 DIAGNOSIS — Z5112 Encounter for antineoplastic immunotherapy: Secondary | ICD-10-CM | POA: Diagnosis not present

## 2021-07-21 DIAGNOSIS — C20 Malignant neoplasm of rectum: Secondary | ICD-10-CM | POA: Diagnosis not present

## 2021-07-21 DIAGNOSIS — K219 Gastro-esophageal reflux disease without esophagitis: Secondary | ICD-10-CM | POA: Diagnosis not present

## 2021-07-21 DIAGNOSIS — Z79899 Other long term (current) drug therapy: Secondary | ICD-10-CM | POA: Diagnosis not present

## 2021-07-21 DIAGNOSIS — Z5111 Encounter for antineoplastic chemotherapy: Secondary | ICD-10-CM | POA: Diagnosis not present

## 2021-07-21 DIAGNOSIS — E876 Hypokalemia: Secondary | ICD-10-CM | POA: Diagnosis not present

## 2021-07-21 DIAGNOSIS — E7800 Pure hypercholesterolemia, unspecified: Secondary | ICD-10-CM | POA: Diagnosis not present

## 2021-07-21 DIAGNOSIS — C799 Secondary malignant neoplasm of unspecified site: Secondary | ICD-10-CM

## 2021-07-21 DIAGNOSIS — R609 Edema, unspecified: Secondary | ICD-10-CM | POA: Diagnosis not present

## 2021-07-21 DIAGNOSIS — Z5189 Encounter for other specified aftercare: Secondary | ICD-10-CM | POA: Diagnosis not present

## 2021-07-21 DIAGNOSIS — I2510 Atherosclerotic heart disease of native coronary artery without angina pectoris: Secondary | ICD-10-CM | POA: Diagnosis not present

## 2021-07-21 DIAGNOSIS — Z87891 Personal history of nicotine dependence: Secondary | ICD-10-CM | POA: Diagnosis not present

## 2021-07-21 MED ORDER — HEPARIN SOD (PORK) LOCK FLUSH 100 UNIT/ML IV SOLN
500.0000 [IU] | Freq: Once | INTRAVENOUS | Status: AC
  Administered 2021-07-21: 500 [IU] via INTRAVENOUS
  Filled 2021-07-21: qty 5

## 2021-07-21 MED ORDER — PEGFILGRASTIM 6 MG/0.6ML ~~LOC~~ PSKT
6.0000 mg | PREFILLED_SYRINGE | Freq: Once | SUBCUTANEOUS | Status: AC
  Administered 2021-07-21: 6 mg via SUBCUTANEOUS
  Filled 2021-07-21: qty 0.6

## 2021-07-21 MED ORDER — SODIUM CHLORIDE 0.9% FLUSH
10.0000 mL | INTRAVENOUS | Status: DC | PRN
  Administered 2021-07-21: 10 mL via INTRAVENOUS
  Filled 2021-07-21: qty 10

## 2021-07-22 ENCOUNTER — Encounter: Payer: Self-pay | Admitting: Oncology

## 2021-07-22 DIAGNOSIS — Z933 Colostomy status: Secondary | ICD-10-CM | POA: Diagnosis not present

## 2021-07-25 ENCOUNTER — Telehealth: Payer: Self-pay | Admitting: *Deleted

## 2021-07-25 DIAGNOSIS — Z7901 Long term (current) use of anticoagulants: Secondary | ICD-10-CM

## 2021-07-25 DIAGNOSIS — I825Z3 Chronic embolism and thrombosis of unspecified deep veins of distal lower extremity, bilateral: Secondary | ICD-10-CM

## 2021-07-25 NOTE — Telephone Encounter (Signed)
Patient needs to pick up refill of her Eliquis, but the price is > $100 due to her being in the doughnut hole and is asking if there is anything we can do to help her get her medicine or a cheaper place she can get it. Please advise

## 2021-07-26 ENCOUNTER — Encounter: Payer: Self-pay | Admitting: Oncology

## 2021-07-26 ENCOUNTER — Other Ambulatory Visit (HOSPITAL_COMMUNITY): Payer: Self-pay

## 2021-07-26 ENCOUNTER — Other Ambulatory Visit: Payer: Self-pay

## 2021-07-26 MED ORDER — APIXABAN 2.5 MG PO TABS
2.5000 mg | ORAL_TABLET | Freq: Two times a day (BID) | ORAL | 2 refills | Status: DC
  Filled 2021-07-26: qty 60, 30d supply, fill #0
  Filled 2021-07-28: qty 60, 30d supply, fill #1

## 2021-07-26 NOTE — Telephone Encounter (Signed)
Hello Ladies, is there any type of assistance that pt can receive for Eliquis?

## 2021-07-26 NOTE — Telephone Encounter (Signed)
After a group discussion, I called [facility] and cancelled the prescription sent there and sent new prescription to [facility]. I called [name] and explained to her that prescription was sent there and that there is a one time use in a lifetime coupon that will get patient 30 days of drug for free. I also let her know that Ulice Dash will be calling her mother to see if she qualifies for any other assistance. SH expressed her gratitude for all of our assistance with this stating that the patient is on a fixed income and has a lot of bills coming in right now

## 2021-07-27 ENCOUNTER — Other Ambulatory Visit: Payer: Self-pay

## 2021-07-28 ENCOUNTER — Other Ambulatory Visit: Payer: Self-pay

## 2021-07-28 DIAGNOSIS — Z933 Colostomy status: Secondary | ICD-10-CM | POA: Diagnosis not present

## 2021-08-01 ENCOUNTER — Encounter: Payer: Self-pay | Admitting: Oncology

## 2021-08-02 ENCOUNTER — Inpatient Hospital Stay: Payer: Medicare Other

## 2021-08-02 ENCOUNTER — Other Ambulatory Visit: Payer: Self-pay

## 2021-08-02 ENCOUNTER — Inpatient Hospital Stay (HOSPITAL_BASED_OUTPATIENT_CLINIC_OR_DEPARTMENT_OTHER): Payer: Medicare Other | Admitting: Oncology

## 2021-08-02 ENCOUNTER — Encounter: Payer: Self-pay | Admitting: Oncology

## 2021-08-02 VITALS — BP 109/78 | HR 87 | Temp 98.0°F | Wt 160.3 lb

## 2021-08-02 VITALS — BP 116/56 | HR 76 | Temp 97.8°F

## 2021-08-02 DIAGNOSIS — Z7901 Long term (current) use of anticoagulants: Secondary | ICD-10-CM | POA: Diagnosis not present

## 2021-08-02 DIAGNOSIS — R609 Edema, unspecified: Secondary | ICD-10-CM | POA: Diagnosis not present

## 2021-08-02 DIAGNOSIS — E7800 Pure hypercholesterolemia, unspecified: Secondary | ICD-10-CM | POA: Diagnosis not present

## 2021-08-02 DIAGNOSIS — Z5111 Encounter for antineoplastic chemotherapy: Secondary | ICD-10-CM | POA: Diagnosis not present

## 2021-08-02 DIAGNOSIS — K219 Gastro-esophageal reflux disease without esophagitis: Secondary | ICD-10-CM | POA: Diagnosis not present

## 2021-08-02 DIAGNOSIS — I700 Atherosclerosis of aorta: Secondary | ICD-10-CM | POA: Diagnosis not present

## 2021-08-02 DIAGNOSIS — Z87891 Personal history of nicotine dependence: Secondary | ICD-10-CM | POA: Diagnosis not present

## 2021-08-02 DIAGNOSIS — C799 Secondary malignant neoplasm of unspecified site: Secondary | ICD-10-CM

## 2021-08-02 DIAGNOSIS — Z86718 Personal history of other venous thrombosis and embolism: Secondary | ICD-10-CM | POA: Diagnosis not present

## 2021-08-02 DIAGNOSIS — C20 Malignant neoplasm of rectum: Secondary | ICD-10-CM | POA: Diagnosis not present

## 2021-08-02 DIAGNOSIS — H9190 Unspecified hearing loss, unspecified ear: Secondary | ICD-10-CM

## 2021-08-02 DIAGNOSIS — Z86711 Personal history of pulmonary embolism: Secondary | ICD-10-CM | POA: Diagnosis not present

## 2021-08-02 DIAGNOSIS — I10 Essential (primary) hypertension: Secondary | ICD-10-CM | POA: Diagnosis not present

## 2021-08-02 DIAGNOSIS — I2510 Atherosclerotic heart disease of native coronary artery without angina pectoris: Secondary | ICD-10-CM | POA: Diagnosis not present

## 2021-08-02 DIAGNOSIS — Z933 Colostomy status: Secondary | ICD-10-CM | POA: Diagnosis not present

## 2021-08-02 DIAGNOSIS — Z79899 Other long term (current) drug therapy: Secondary | ICD-10-CM | POA: Diagnosis not present

## 2021-08-02 DIAGNOSIS — Z5189 Encounter for other specified aftercare: Secondary | ICD-10-CM | POA: Diagnosis not present

## 2021-08-02 DIAGNOSIS — Z800 Family history of malignant neoplasm of digestive organs: Secondary | ICD-10-CM | POA: Diagnosis not present

## 2021-08-02 DIAGNOSIS — E876 Hypokalemia: Secondary | ICD-10-CM | POA: Diagnosis not present

## 2021-08-02 DIAGNOSIS — Z923 Personal history of irradiation: Secondary | ICD-10-CM | POA: Diagnosis not present

## 2021-08-02 DIAGNOSIS — Z5112 Encounter for antineoplastic immunotherapy: Secondary | ICD-10-CM | POA: Diagnosis not present

## 2021-08-02 LAB — CBC WITH DIFFERENTIAL/PLATELET
Abs Immature Granulocytes: 0.05 10*3/uL (ref 0.00–0.07)
Basophils Absolute: 0 10*3/uL (ref 0.0–0.1)
Basophils Relative: 1 %
Eosinophils Absolute: 0.1 10*3/uL (ref 0.0–0.5)
Eosinophils Relative: 2 %
HCT: 36.1 % (ref 36.0–46.0)
Hemoglobin: 11.2 g/dL — ABNORMAL LOW (ref 12.0–15.0)
Immature Granulocytes: 1 %
Lymphocytes Relative: 27 %
Lymphs Abs: 1.3 10*3/uL (ref 0.7–4.0)
MCH: 30.2 pg (ref 26.0–34.0)
MCHC: 31 g/dL (ref 30.0–36.0)
MCV: 97.3 fL (ref 80.0–100.0)
Monocytes Absolute: 0.6 10*3/uL (ref 0.1–1.0)
Monocytes Relative: 12 %
Neutro Abs: 2.6 10*3/uL (ref 1.7–7.7)
Neutrophils Relative %: 57 %
Platelets: 214 10*3/uL (ref 150–400)
RBC: 3.71 MIL/uL — ABNORMAL LOW (ref 3.87–5.11)
RDW: 17.3 % — ABNORMAL HIGH (ref 11.5–15.5)
WBC: 4.6 10*3/uL (ref 4.0–10.5)
nRBC: 0 % (ref 0.0–0.2)

## 2021-08-02 LAB — COMPREHENSIVE METABOLIC PANEL
ALT: 16 U/L (ref 0–44)
AST: 27 U/L (ref 15–41)
Albumin: 3.9 g/dL (ref 3.5–5.0)
Alkaline Phosphatase: 67 U/L (ref 38–126)
Anion gap: 8 (ref 5–15)
BUN: 8 mg/dL (ref 8–23)
CO2: 27 mmol/L (ref 22–32)
Calcium: 9.1 mg/dL (ref 8.9–10.3)
Chloride: 102 mmol/L (ref 98–111)
Creatinine, Ser: 1.07 mg/dL — ABNORMAL HIGH (ref 0.44–1.00)
GFR, Estimated: 55 mL/min — ABNORMAL LOW (ref >60)
Glucose, Bld: 120 mg/dL — ABNORMAL HIGH (ref 70–99)
Potassium: 3.3 mmol/L — ABNORMAL LOW (ref 3.5–5.1)
Sodium: 137 mmol/L (ref 135–145)
Total Bilirubin: 0.4 mg/dL (ref 0.3–1.2)
Total Protein: 6.8 g/dL (ref 6.5–8.1)

## 2021-08-02 LAB — PROTEIN, URINE, RANDOM: Total Protein, Urine: 32 mg/dL

## 2021-08-02 MED ORDER — SODIUM CHLORIDE 0.9 % IV SOLN
Freq: Once | INTRAVENOUS | Status: AC
Start: 2021-08-02 — End: 2021-08-02
  Filled 2021-08-02: qty 250

## 2021-08-02 MED ORDER — MONTELUKAST SODIUM 10 MG PO TABS
10.0000 mg | ORAL_TABLET | Freq: Once | ORAL | Status: AC
  Administered 2021-08-02: 10 mg via ORAL
  Filled 2021-08-02: qty 1

## 2021-08-02 MED ORDER — FLUOROURACIL CHEMO INJECTION 5 GM/100ML
2400.0000 mg/m2 | INTRAVENOUS | Status: DC
  Administered 2021-08-02: 4350 mg via INTRAVENOUS
  Filled 2021-08-02: qty 87

## 2021-08-02 MED ORDER — DIPHENHYDRAMINE HCL 50 MG/ML IJ SOLN
25.0000 mg | Freq: Every day | INTRAMUSCULAR | Status: DC | PRN
  Administered 2021-08-02: 25 mg via INTRAVENOUS
  Filled 2021-08-02: qty 1

## 2021-08-02 MED ORDER — SODIUM CHLORIDE 0.9 % IV SOLN
135.0000 mg/m2 | Freq: Once | INTRAVENOUS | Status: AC
  Administered 2021-08-02: 240 mg via INTRAVENOUS
  Filled 2021-08-02: qty 10

## 2021-08-02 MED ORDER — FLUOROURACIL CHEMO INJECTION 2.5 GM/50ML
400.0000 mg/m2 | Freq: Once | INTRAVENOUS | Status: AC
  Administered 2021-08-02: 750 mg via INTRAVENOUS
  Filled 2021-08-02: qty 15

## 2021-08-02 MED ORDER — SODIUM CHLORIDE 0.9 % IV SOLN
10.0000 mg | Freq: Once | INTRAVENOUS | Status: AC
  Administered 2021-08-02: 10 mg via INTRAVENOUS
  Filled 2021-08-02: qty 10

## 2021-08-02 MED ORDER — SODIUM CHLORIDE 0.9 % IV SOLN
750.0000 mg | Freq: Once | INTRAVENOUS | Status: AC
  Administered 2021-08-02: 750 mg via INTRAVENOUS
  Filled 2021-08-02: qty 37.5

## 2021-08-02 MED ORDER — ATROPINE SULFATE 1 MG/ML IV SOLN
0.5000 mg | Freq: Once | INTRAVENOUS | Status: AC
  Administered 2021-08-02: 0.5 mg via INTRAVENOUS
  Filled 2021-08-02: qty 1

## 2021-08-02 MED ORDER — PALONOSETRON HCL INJECTION 0.25 MG/5ML
0.2500 mg | Freq: Once | INTRAVENOUS | Status: AC
  Administered 2021-08-02: 0.25 mg via INTRAVENOUS
  Filled 2021-08-02: qty 5

## 2021-08-02 MED ORDER — SODIUM CHLORIDE 0.9 % IV SOLN
5.0000 mg/kg | Freq: Once | INTRAVENOUS | Status: AC
  Administered 2021-08-02: 400 mg via INTRAVENOUS
  Filled 2021-08-02: qty 16

## 2021-08-02 NOTE — Patient Instructions (Addendum)
[facility]  Discharge Instructions: Thank you for choosing [facility] to provide your oncology and hematology care.  If you have a lab appointment with the [facility], please go directly to the [facility] and check in at the registration area.  Wear comfortable clothing and clothing appropriate for easy access to any Portacath or PICC line.   We strive to give you quality time with your provider. You may need to reschedule your appointment if you arrive late (15 or more minutes).  Arriving late affects you and other patients whose appointments are after yours.  Also, if you miss three or more appointments without notifying the office, you may be dismissed from the clinic at the provider's discretion.      For prescription refill requests, have your pharmacy contact our office and allow 72 hours for refills to be completed.    Today you received the following chemotherapy and/or immunotherapy agents Mvasi,  Irinotecan, Leucovorin and Adrucil       To help prevent nausea and vomiting after your treatment, we encourage you to take your nausea medication as directed.  BELOW ARE SYMPTOMS THAT SHOULD BE REPORTED IMMEDIATELY: *FEVER GREATER THAN 100.4 F (38 C) OR HIGHER *CHILLS OR SWEATING *NAUSEA AND VOMITING THAT IS NOT CONTROLLED WITH YOUR NAUSEA MEDICATION *UNUSUAL SHORTNESS OF BREATH *UNUSUAL BRUISING OR BLEEDING *URINARY PROBLEMS (pain or burning when urinating, or frequent urination) *BOWEL PROBLEMS (unusual diarrhea, constipation, pain near the anus) TENDERNESS IN MOUTH AND THROAT WITH OR WITHOUT PRESENCE OF ULCERS (sore throat, sores in mouth, or a toothache) UNUSUAL RASH, SWELLING OR PAIN  UNUSUAL VAGINAL DISCHARGE OR ITCHING   Items with * indicate a potential emergency and should be followed up as soon as possible or go to the Emergency Department if any problems should occur.  Please show the CHEMOTHERAPY ALERT CARD or  IMMUNOTHERAPY ALERT CARD at check-in to the Emergency Department and triage nurse.  Should you have questions after your visit or need to cancel or reschedule your appointment, please contact [facility]  [phone] and follow the prompts.  Office hours are 8:00 a.m. to 4:30 p.m. Monday - Friday. Please note that voicemails left after 4:00 p.m. may not be returned until the following business day.  We are closed weekends and major holidays. You have access to a nurse at all times for urgent questions. Please call the main number to the clinic [phone] and follow the prompts.  For any non-urgent questions, you may also contact your provider using MyChart. We now offer e-Visits for anyone 61 and older to request care online for non-urgent symptoms. For details visit [email or web address].   Also download the MyChart app! Go to the app store, search "MyChart", open the app, select [facility], and log in with your MyChart username and password.  Due to Covid, a mask is required upon entering the hospital/clinic. If you do not have a mask, one will be given to you upon arrival. For doctor visits, patients may have 1 support person aged 39 or older with them. For treatment visits, patients cannot have anyone with them due to current Covid guidelines and our immunocompromised population.

## 2021-08-02 NOTE — Progress Notes (Signed)
Hematology/Oncology progress note [facility] [phone] [phone]   Patient Care Team: Karamalegos, Alexander J, DO as PCP - General (Family Medicine) Stanton, Kristi D, RN as Registered Nurse Yu, Zhou, MD as Consulting Physician (Hematology and Oncology)  CHIEF COMPLAINTS/REASON FOR VISIT:  Follow up for rectal cancer  HISTORY OF PRESENTING ILLNESS:  Patient initially presented with complaints of postmenopausal bleeding on 08/16/2018.  History of was menopausal vaginal bleeding in 2016 which resulted in cervical polypectomy.  Pathology 02/04/2015 showed cervical polyp, consistent with benign endometrial polyp.  [name] after polypectomy due to anxiety associated with pelvic exams.  pelvic exam on 08/16/2018 reviewed cervical abnormality and from enlarged uterus. Seen by Dr. Cherry on 10/29/2018.  Endometrial biopsy and a Pap smear was performed. 10/29/2018 Pap smear showed adenocarcinoma, favor endometrial origin. 10/29/2018 endometrial biopsy showed endometrioid carcinoma, FIGO grade 1.  10/29/2018- TA & TV Ultrasound revealed: Anteverted uterus measuring 8.7 x 5.6 x 6.4 cm without evidence of focal masses.  The endometrium measuring 24.1 mm (thickened) and heterogeneous.  Right and left ovaries not visualized.  No adnexal masses identified.  No free fluid in cul-de-sac.  Patient was seen by Dr. Secord in clinic on 11/13/2018.  Cervical exam reveals 2 cm exophytic irregular mass consistent with malignancy.   11/19/2018 CT chest abdomen pelvis with contrast showed thickened endometrium with some irregularity compatible with the provided diagnosis of endometrial malignancy.  There is a mildly prominent left inguinal node 1.4 cm.  Patient was seen by Dr. Berchuck on 11/20/2018 and left groin lymph node biopsy was recommended.  11/26/2018 patient underwent left inguinal lymph node biopsy. Pathology showed metastatic adenocarcinoma consistent  with colorectal origin.  CDX 2+.  Case was discussed on tumor board.  Recommend colonoscopy for further evaluation.  Patient reports significant weight loss 30 pounds over the last year.  Chronic vaginal spotting. Change of bowel habits the past few months.  More constipated.  Family history positive for brother who has colon cancer prostate cancer.  patient has underwent colonoscopy on 12/03/2018 which reviewed a nonobstructing large mass in the rectum.  Also chronic fistula.  Mass was not circumferential.  This was biopsied with a cold forceps for histology.  Pathology came back hyperplastic polyp negative for dysplasia and malignancy. [name], patient underwent flex sigmoidoscopy on 12/06/2018 with rebiopsy of the rectal mass. This time biopsy results came back positive for invasive colorectal adenocarcinoma, moderately differentiated. Immunotherapy for nearly mismatch repair protein (MMR ) was performed.  There is no loss of MMR expression.  low probability of MSI high.   # Seen by [name] for evaluation of resectability for rectal cancer. In addition, she also had a second opinion with [facility] pathology where her endometrial biopsy pathology was changed to  adenocarcinoma, consistent with colorectal primary.   [name]. She has home health that has been assisting with ostomy care  Patient was also evaluated by [facility] oncology.  Recommendation is to proceed with TNT with concurrent chemoradiation followed by neoadjuvant chemotherapy followed by surgical resection. Patient prefers to have treatment done locally with [facility].   # Oncology Treatment:  02/03/2019- 03/19/2019  concurrent Xeloda and radiation.  Xeloda dose 825mg /m2 BID - rounded to 1650mg BID- on days of radiation. 04/09/2019, started on FOLFOX with bolus early.  Omitted.  07/16/2019 finished 8 cycles of FOLFOX. 09/17/19 APR/posterior vaginectomy/TAH/BSO/VY-flap pT4b pN0  with close vaginal margin 0.2 mm.  Uterus and ovaries   negative for malignancy. Patient reports bilateral lower extremity numbness and tingling, intermittent, left worse than right. She has lost a lot of weight since her APR surgery.   #Family history with half brother having's history of colon cancer prostate cancer.  Personal history of colorectal cancer.  Patient has not decided if she wants genetic testing.    # history of PE( 01/13/2020)  in the bilateral lower extremity DVT (01/13/2020).   She finishes 6 months of anticoagulation with Eliquis 5 mg twice daily. Now switched to Eliquis 2.5 mg twice daily..  # She has now developed recurrent disease. #06/30/20  vaginal introitus mass biopsied. Pathology is consistent with metastatic colorectal adenocarcinoma I have discussed with [name]  Dr. Mantyh and the mass is not resectable. Patient has also had colonoscopy by Dr. Anna yesterday. Normal examination. # 07/16/2020 cycle 1 FOLFIRI  # 07/20/2020 PET scan was done for further evaluation, images are consistent with local recurrence, no distant metastasis. #Discussed with radiation oncology Dr. Chrystal will recommends concurrent chemotherapy and radiation. 08/02/2020-08/16/2020, patient starts radiation.  Xeloda was held due to neutropenia 08/17/2020,-09/06/2020 Xeloda 1500 mg twice daily concurrently with radiation  01/31/21 started on FOLFIRI + Bev 05/18/2021 CT chest abdomen pelvis showed Previously noted enlargement of bilateral inguinal lymph nodes is resolved, consistent with treatment response of nodal metastatic disease. Interval decrease in size of multiple small bilateral pulmonary nodules, consistent with treatment response of pulmonary metastatic disease. No evidence of new metastatic disease.  INTERVAL HISTORY Emunah F Vandenbrink is a 75 y.o. female who has above history reviewed by me presents for follow-up of rectal cancer. She tolerates chemotherapy well, diarrhea is manageable with  as needed Imodium.  Symptom has improved since the dose reduction of irinotecan Denies any nausea vomiting. Appetite is fair.   Review of Systems  Constitutional:  Negative for appetite change, chills, fatigue, fever and unexpected weight change.  HENT:   Negative for hearing loss and voice change.   Eyes:  Negative for eye problems.  Respiratory:  Negative for chest tightness and cough.   Cardiovascular:  Negative for chest pain.  Gastrointestinal:  Positive for diarrhea. Negative for abdominal distention, abdominal pain, blood in stool, constipation and nausea.  Endocrine: Negative for hot flashes.  Genitourinary:  Negative for difficulty urinating and frequency.   Musculoskeletal:  Negative for arthralgias.  Skin:  Negative for itching and rash.  Neurological:  Negative for extremity weakness and numbness.  Hematological:  Negative for adenopathy.  Psychiatric/Behavioral:  Negative for confusion.    MEDICAL HISTORY:  Past Medical History:  Diagnosis Date   Allergy    Arthritis    Blood clot in vein    Family history of colon cancer    GERD (gastroesophageal reflux disease)    Hypercholesteremia    Hypertension    Hypertension    Lower extremity edema    Rectal cancer (HCC) 12/2018   Urinary incontinence     SURGICAL HISTORY: Past Surgical History:  Procedure Laterality Date   ABDOMINAL HYSTERECTOMY     CHOLECYSTECTOMY  1971   COLONOSCOPY WITH PROPOFOL N/A 12/03/2018   Procedure: COLONOSCOPY WITH PROPOFOL;  Surgeon: Wohl, Darren, MD;  Location: [facility] ENDOSCOPY;  Service: Endoscopy;  Laterality: N/A;   COLONOSCOPY WITH PROPOFOL N/A 07/15/2020   Procedure: COLONOSCOPY WITH PROPOFOL;  Surgeon: Anna, Kiran, MD;  Location: [facility] ENDOSCOPY;  Service: Gastroenterology;  Laterality: N/A;   FLEXIBLE SIGMOIDOSCOPY N/A 12/06/2018   Procedure: FLEXIBLE SIGMOIDOSCOPY;  Surgeon: Anna, Kiran, MD;  Location: [facility] ENDOSCOPY;    Service: Endoscopy;  Laterality: N/A;   LAPAROSCOPIC COLOSTOMY   01/06/2019   PORTACATH PLACEMENT N/A 04/03/2019   Procedure: INSERTION PORT-A-CATH;  Surgeon: Jules Husbands, MD;  Location: [facility] ORS;  Service: General;  Laterality: N/A;    SOCIAL HISTORY: Social History   Socioeconomic History   Marital status: Widowed    Spouse name: Not on file   Number of children: Not on file   Years of education: Not on file   Highest education level: Not on file  Occupational History   Not on file  Tobacco Use   Smoking status: Former    Types: Cigarettes    Quit date: 12/02/1977    Years since quitting: 43.6   Smokeless tobacco: Former  Scientific laboratory technician Use: Never used  Substance and Sexual Activity   Alcohol use: Never   Drug use: Never   Sexual activity: Not Currently    Birth control/protection: None  Other Topics Concern   Not on file  Social History Narrative   Lives with daughter   Social Determinants of Health   Financial Resource Strain: Low Risk    Difficulty of Paying Living Expenses: Not hard at all  Food Insecurity: No Food Insecurity   Worried About Charity fundraiser in the Last Year: Never true   [place] in the Last Year: Never true  Transportation Needs: No Transportation Needs   Lack of Transportation (Medical): No   Lack of Transportation (Non-Medical): No  Physical Activity: Inactive   Days of Exercise per Week: 0 days   Minutes of Exercise per Session: 0 min  Stress: No Stress Concern Present   Feeling of Stress : Not at all  Social Connections: Not on file  Intimate Partner Violence: Not on file    FAMILY HISTORY: Family History  Problem Relation Age of Onset   Colon cancer Brother 85       exposure to chemicals [place]   Hypertension Mother    Stroke Mother    Kidney failure Father    Breast cancer Neg Hx    Ovarian cancer Neg Hx     ALLERGIES:  is allergic to sulfamethoxazole-trimethoprim.  MEDICATIONS:  Current Outpatient Medications  Medication Sig Dispense Refill   apixaban (ELIQUIS)  2.5 MG TABS tablet Take 1 tablet (2.5 mg total) by mouth 2 (two) times daily. 60 tablet 2   Cholecalciferol (VITAMIN D3) 2000 units capsule Take 2,000 Units by mouth daily.     diclofenac sodium (VOLTAREN) 1 % GEL Apply 2 g topically 4 (four) times daily as needed (joint pain).  11   diphenoxylate-atropine (LOMOTIL) 2.5-0.025 MG tablet Take 1 tablet by mouth 4 (four) times daily as needed for diarrhea or loose stools. 30 tablet 1   fluticasone (FLONASE) 50 MCG/ACT nasal spray Use 1 spray(s) in each nostril once daily 16 g 1   gabapentin (NEURONTIN) 100 MG capsule Take 1 capsule (100 mg total) by mouth at bedtime. 30 capsule 0   ipratropium (ATROVENT) 0.03 % nasal spray Place 1 spray into both nostrils 2 (two) times daily. 30 mL 2   lidocaine-prilocaine (EMLA) cream Apply 1 application topically as needed. 30 g 6   loperamide (IMODIUM) 2 MG capsule Take 1 capsule (2 mg total) by mouth See admin instructions. With onset of loose stool, take 68m followed by 269mevery 2 hours,  Maximum: 16 mg/day 120 capsule 1   loratadine (CLARITIN) 10 MG tablet Take 10 mg by mouth daily.  Multiple Vitamins-Minerals (ONE-A-DAY WOMENS 50 PLUS PO) Take 1 tablet by mouth daily.      ondansetron (ZOFRAN) 8 MG tablet Take 1 tablet (8 mg total) by mouth 2 (two) times daily as needed for refractory nausea / vomiting. Start on day 3 after chemotherapy. 30 tablet 1   potassium chloride SA (KLOR-CON) 20 MEQ tablet Take 1 tablet (20 mEq total) by mouth 2 (two) times daily. 90 tablet 3   prochlorperazine (COMPAZINE) 10 MG tablet Take 1 tablet (10 mg total) by mouth every 6 (six) hours as needed (NAUSEA). 30 tablet 1   simvastatin (ZOCOR) 40 MG tablet Take 1 tablet (40 mg total) by mouth at bedtime. 90 tablet 3   triamterene-hydrochlorothiazide (DYAZIDE) 37.5-25 MG capsule Take 1 each (1 capsule total) by mouth daily. 90 capsule 3   zinc gluconate 50 MG tablet Take 50 mg by mouth daily.     No current facility-administered  medications for this visit.   Facility-Administered Medications Ordered in Other Visits  Medication Dose Route Frequency Provider Last Rate Last Admin   diphenhydrAMINE (BENADRYL) injection 25 mg  25 mg Intravenous Daily PRN Earlie Server, MD       diphenhydrAMINE (BENADRYL) injection 25 mg  25 mg Intravenous Daily PRN Earlie Server, MD   25 mg at 08/02/21 1056   fluorouracil (ADRUCIL) 4,350 mg in sodium chloride 0.9 % 63 mL chemo infusion  2,400 mg/m2 (Order-Specific) Intravenous 1 day or 1 dose Earlie Server, MD       fluorouracil (ADRUCIL) chemo injection 750 mg  400 mg/m2 (Treatment Plan Recorded) Intravenous Once Earlie Server, MD       irinotecan (CAMPTOSAR) 240 mg in sodium chloride 0.9 % 500 mL chemo infusion  135 mg/m2 (Order-Specific) Intravenous Once Earlie Server, MD 341 mL/hr at 08/02/21 1127 240 mg at 08/02/21 1127   leucovorin 750 mg in sodium chloride 0.9 % 250 mL infusion  750 mg Intravenous Once Earlie Server, MD 192 mL/hr at 08/02/21 1126 750 mg at 08/02/21 1126     PHYSICAL EXAMINATION: ECOG PERFORMANCE STATUS: 1 - Symptomatic but completely ambulatory  Physical Exam Constitutional:      General: She is not in acute distress. HENT:     Head: Normocephalic and atraumatic.  Eyes:     General: No scleral icterus. Cardiovascular:     Rate and Rhythm: Normal rate and regular rhythm.     Heart sounds: Normal heart sounds.  Pulmonary:     Effort: Pulmonary effort is normal. No respiratory distress.     Breath sounds: No wheezing.  Abdominal:     General: Bowel sounds are normal. There is no distension.     Palpations: Abdomen is soft.     Comments: + Colostomy bag   Musculoskeletal:        General: No deformity. Normal range of motion.     Cervical back: Normal range of motion and neck supple.  Skin:    General: Skin is warm and dry.     Findings: No erythema or rash.  Neurological:     Mental Status: She is alert and oriented to person, place, and time. Mental status is at baseline.      Cranial Nerves: No cranial nerve deficit.     Coordination: Coordination normal.      LABORATORY DATA:  I have reviewed the data as listed Lab Results  Component Value Date   WBC 4.6 08/02/2021   HGB 11.2 (L) 08/02/2021   HCT 36.1 08/02/2021  MCV 97.3 08/02/2021   PLT 214 08/02/2021   Recent Labs    07/05/21 0909 07/19/21 0754 08/02/21 0843  NA 137 135 137  K 3.3* 3.3* 3.3*  CL 101 100 102  CO2 _0 GLUCOSE 115* 110* 120*  BUN _1 CREATININE 1.06* 1.11* 1.07*  CALCIUM 9.2 9.4 9.1  GFRNONAA 55* 52* 55*  PROT 7.1 7.4 6.8  ALBUMIN 4.1 4.4 3.9  AST 26 36 27  ALT 16 38 16  ALKPHOS 73 96 67  BILITOT 0.6 0.5 0.4    Iron/TIBC/Ferritin/ %Sat    Component Value Date/Time   IRON 40 06/23/2020 1124   TIBC 246 (L) 06/23/2020 1124   FERRITIN 338 (H) 06/23/2020 1124   IRONPCTSAT 16 06/23/2020 1124     RADIOGRAPHIC STUDIES: I have personally reviewed the radiological images as listed and agreed with the findings in the report. CT CHEST ABDOMEN PELVIS W CONTRAST  Result Date: 05/19/2021 CLINICAL DATA:  Rectal cancer restaging, vaginal recurrence, ongoing chemotherapy EXAM: CT CHEST, ABDOMEN, AND PELVIS WITH CONTRAST TECHNIQUE: Multidetector CT imaging of the chest, abdomen and pelvis was performed following the standard protocol during bolus administration of intravenous contrast. CONTRAST:  117m OMNIPAQUE IOHEXOL 300 MG/ML SOLN, additional oral enteric contrast COMPARISON:  01/19/2021 FINDINGS: CT CHEST FINDINGS Cardiovascular: Right chest port catheter. Aortic atherosclerosis. Normal heart size. Scattered left and right coronary artery calcifications enlargement of the main pulmonary artery measuring up to 3.7 cm in caliber. No pericardial effusion. Mediastinum/Nodes: No enlarged mediastinal, hilar, or axillary lymph nodes. Thyroid gland, trachea, and esophagus demonstrate no significant findings. Lungs/Pleura: Minimal, bandlike scarring of the bilateral lung bases.  Interval decrease in size of multiple small bilateral pulmonary nodules, for example a 5 mm nodule of the medial segment right middle lobe, previously 8 mm (series 4, image 97) and a 2 mm nodule of the posterior right upper lobe, previously 4 mm (series 4, image 48). No pleural effusion or pneumothorax. Musculoskeletal: No chest wall mass or suspicious bone lesions identified. CT ABDOMEN PELVIS FINDINGS Hepatobiliary: No focal liver abnormality is seen. Status post cholecystectomy. Postoperative biliary dilatation. Pancreas: Unremarkable. No pancreatic ductal dilatation or surrounding inflammatory changes. Spleen: Normal in size without significant abnormality. Adrenals/Urinary Tract: Adrenal glands are unremarkable. Multiple bilateral renal cysts. Kidneys are otherwise normal, without renal calculi, solid lesion, or hydronephrosis. Bladder is unremarkable. Stomach/Bowel: Stomach is within normal limits. Appendix appears normal. No evidence of bowel wall thickening, distention, or inflammatory changes. Redemonstrated postoperative findings of abdominoperineal resection with left lower quadrant end colostomy. Diverticulosis of the remnant sigmoid. Vascular/Lymphatic: Aortic atherosclerosis. Previously noted enlargement of bilateral inguinal lymph nodes is resolved, left inguinal node measuring 1.5 x 0.8 cm, previously 2.5 x 1.4 cm (series 2, image 105). Reproductive: Status post hysterectomy. Some appearance of vaginal soft tissue thickening, in keeping with report of vaginal recurrence (series 2, image 114). Other: No abdominal wall hernia or abnormality. No abdominopelvic ascites. Musculoskeletal: No acute or significant osseous findings. IMPRESSION: 1. Redemonstrated postoperative findings of abdominoperineal resection with left lower quadrant end colostomy. 2. Some appearance of vaginal soft tissue thickening, in keeping with report of vaginal recurrence of rectal malignancy. This would be better assessed by MRI  and physical examination. 3. Previously noted enlargement of bilateral inguinal lymph nodes is resolved, consistent with treatment response of nodal metastatic disease. 4. Interval decrease in size of multiple small bilateral pulmonary nodules, consistent with treatment response of pulmonary metastatic disease. 5. No evidence of new metastatic disease  in the chest, abdomen, or pelvis. 6. Coronary artery disease. Aortic Atherosclerosis (ICD10-I70.0). Electronically Signed   By: Alex  Bibbey M.D.   On: 05/19/2021 10:40      CT CHEST ABDOMEN PELVIS W CONTRAST  Result Date: 05/19/2021 CLINICAL DATA:  Rectal cancer restaging, vaginal recurrence, ongoing chemotherapy EXAM: CT CHEST, ABDOMEN, AND PELVIS WITH CONTRAST TECHNIQUE: Multidetector CT imaging of the chest, abdomen and pelvis was performed following the standard protocol during bolus administration of intravenous contrast. CONTRAST:  100mL OMNIPAQUE IOHEXOL 300 MG/ML SOLN, additional oral enteric contrast COMPARISON:  01/19/2021 FINDINGS: CT CHEST FINDINGS Cardiovascular: Right chest port catheter. Aortic atherosclerosis. Normal heart size. Scattered left and right coronary artery calcifications enlargement of the main pulmonary artery measuring up to 3.7 cm in caliber. No pericardial effusion. Mediastinum/Nodes: No enlarged mediastinal, hilar, or axillary lymph nodes. Thyroid gland, trachea, and esophagus demonstrate no significant findings. Lungs/Pleura: Minimal, bandlike scarring of the bilateral lung bases. Interval decrease in size of multiple small bilateral pulmonary nodules, for example a 5 mm nodule of the medial segment right middle lobe, previously 8 mm (series 4, image 97) and a 2 mm nodule of the posterior right upper lobe, previously 4 mm (series 4, image 48). No pleural effusion or pneumothorax. Musculoskeletal: No chest wall mass or suspicious bone lesions identified. CT ABDOMEN PELVIS FINDINGS Hepatobiliary: No focal liver abnormality is  seen. Status post cholecystectomy. Postoperative biliary dilatation. Pancreas: Unremarkable. No pancreatic ductal dilatation or surrounding inflammatory changes. Spleen: Normal in size without significant abnormality. Adrenals/Urinary Tract: Adrenal glands are unremarkable. Multiple bilateral renal cysts. Kidneys are otherwise normal, without renal calculi, solid lesion, or hydronephrosis. Bladder is unremarkable. Stomach/Bowel: Stomach is within normal limits. Appendix appears normal. No evidence of bowel wall thickening, distention, or inflammatory changes. Redemonstrated postoperative findings of abdominoperineal resection with left lower quadrant end colostomy. Diverticulosis of the remnant sigmoid. Vascular/Lymphatic: Aortic atherosclerosis. Previously noted enlargement of bilateral inguinal lymph nodes is resolved, left inguinal node measuring 1.5 x 0.8 cm, previously 2.5 x 1.4 cm (series 2, image 105). Reproductive: Status post hysterectomy. Some appearance of vaginal soft tissue thickening, in keeping with report of vaginal recurrence (series 2, image 114). Other: No abdominal wall hernia or abnormality. No abdominopelvic ascites. Musculoskeletal: No acute or significant osseous findings. IMPRESSION: 1. Redemonstrated postoperative findings of abdominoperineal resection with left lower quadrant end colostomy. 2. Some appearance of vaginal soft tissue thickening, in keeping with report of vaginal recurrence of rectal malignancy. This would be better assessed by MRI and physical examination. 3. Previously noted enlargement of bilateral inguinal lymph nodes is resolved, consistent with treatment response of nodal metastatic disease. 4. Interval decrease in size of multiple small bilateral pulmonary nodules, consistent with treatment response of pulmonary metastatic disease. 5. No evidence of new metastatic disease in the chest, abdomen, or pelvis. 6. Coronary artery disease. Aortic Atherosclerosis  (ICD10-I70.0). Electronically Signed   By: Alex  Bibbey M.D.   On: 05/19/2021 10:40     ASSESSMENT & PLAN:  1. Metastatic adenocarcinoma (HCC)   2. Encounter for antineoplastic chemotherapy   3. Rectal cancer (HCC)   4. Current use of long term anticoagulation   Cancer Staging Rectal cancer (HCC) Staging form: Colon and Rectum, AJCC 8th Edition - Clinical stage from 01/23/2019: Stage IIIC (cT4b, cN1a, cM0) - Signed by Yu, Zhou, MD on 01/23/2019 - Pathologic stage from 10/06/2019: Stage IIC (ypT4b, pN0, cM0) - Signed by Yu, Zhou, MD on 10/06/2019 - Pathologic: Stage Unknown (rpTX, pNX, cM1) - Signed by Yu, Zhou,   MD on 01/31/2021  #History of stage IIIC Rectal cancer, s/p TNT, followed by 09/17/19 APR/posterior vaginectomy/TAH/BSO/VY-flap, pT4b pN0 with close vaginal margin 0.2 mm.  Uterus and ovaries negative for malignancy. palliative radiation to vaginal recurrence.  Finished 09/04/2020 01/19/21 recurrence with lung metastasis.-Palliative  05/18/2021 positive treatment response. CEA has decreased.-290 Labs reviewed and discussed with patient .  Proceed with FOLFIRI plus bevacizumab today.-Irinotecan at reduced dose 125 mg/m2 Neulasta on day 3.  Claritin for 4 days. Plan to obtain CT chest abdomen pelvis with contrast after next visit.  #Elevated creatinine, encourage oral hydration.  Patient will receive IV fluid 1 L of normal saline today.  # Post treatment hives, likely due to chemotherapy. Symptoms are better.  She gets additional one dose of IV benadryl 56m x 1 if needed..  Current  oral benadryl as instructed PRN hive at home.   #Chemotherapy-induced neutropenia, see above.  On GCSF  #Chemotherapy-induced diarrhea, symptoms are stable.  Continue as needed continue Imodium or lomotil as needed..  Dose reduced Irinotecan.   #Hypokalemia, potassium 3.3, continue potassium chloride 20 mEq twice daily.  #History of pulmonary embolism, history of bilateral DVT Continue Eliquis 2.5 mg twice  daily for anticoagulation prophylaxis.   Follow-up 2 weeks for next cycle of treatment.  ZEarlie Server MD, PhD

## 2021-08-02 NOTE — Progress Notes (Signed)
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   Met with patient during infusion.  Reports that diarrhea is better and appetite has improved.  "I feel hungry now."  Says that she is really going to work on drinking more water, pedialyte.  Yesterday was able to eat grilled cheese sandwich with egg, sausage, coffee and water.  Late lunch/early supper was green beans, chicken, potato salad and cornbread.  Later had a snack cake.  Drinks ensure but may not drink it every day depending on intake    Medications: reviewed  Labs: reviewed  Anthropometrics:   Weight 160 lb 4 oz   NUTRITION DIAGNOSIS: Altered GI function improving    INTERVENTION:  Patient to continue choosing foods high in protein and calories Continue oral nutrition supplements for added nutrition Continue strategies to help with diarrhea    MONITORING, EVALUATION, GOAL: weight trends, intake   NEXT VISIT: ~ 4 weeks during treatment  Kyron Schlitt B. Zenia Resides, [place], [place] Registered Dietitian [phone] (mobile)

## 2021-08-02 NOTE — Progress Notes (Signed)
Post Irinotecan/Leucovorin pt developed hives. Pt denies any other s/s or complaints. Pt and VS stable, no s/s of distress noted. Pt develops hives with each treatment of Irinotecan/Leucovorin and Dr. Tasia Catchings has been made aware, 25 MG IV Benadryl given per order. Pt educated to call clinic with any concerns/questions and when to seek emergency care. Pt verbalizes understanding. Pt and VS stable at discharge.

## 2021-08-04 ENCOUNTER — Encounter: Payer: Self-pay | Admitting: Family Medicine

## 2021-08-04 ENCOUNTER — Ambulatory Visit (INDEPENDENT_AMBULATORY_CARE_PROVIDER_SITE_OTHER): Payer: Medicare Other | Admitting: Podiatry

## 2021-08-04 ENCOUNTER — Other Ambulatory Visit: Payer: Self-pay

## 2021-08-04 ENCOUNTER — Inpatient Hospital Stay: Payer: Medicare Other

## 2021-08-04 DIAGNOSIS — Z933 Colostomy status: Secondary | ICD-10-CM | POA: Diagnosis not present

## 2021-08-04 DIAGNOSIS — M79675 Pain in left toe(s): Secondary | ICD-10-CM

## 2021-08-04 DIAGNOSIS — Z5111 Encounter for antineoplastic chemotherapy: Secondary | ICD-10-CM | POA: Diagnosis not present

## 2021-08-04 DIAGNOSIS — B351 Tinea unguium: Secondary | ICD-10-CM

## 2021-08-04 DIAGNOSIS — Z7901 Long term (current) use of anticoagulants: Secondary | ICD-10-CM | POA: Diagnosis not present

## 2021-08-04 DIAGNOSIS — K219 Gastro-esophageal reflux disease without esophagitis: Secondary | ICD-10-CM | POA: Diagnosis not present

## 2021-08-04 DIAGNOSIS — I2510 Atherosclerotic heart disease of native coronary artery without angina pectoris: Secondary | ICD-10-CM | POA: Diagnosis not present

## 2021-08-04 DIAGNOSIS — D689 Coagulation defect, unspecified: Secondary | ICD-10-CM | POA: Diagnosis not present

## 2021-08-04 DIAGNOSIS — C20 Malignant neoplasm of rectum: Secondary | ICD-10-CM

## 2021-08-04 DIAGNOSIS — Z800 Family history of malignant neoplasm of digestive organs: Secondary | ICD-10-CM | POA: Diagnosis not present

## 2021-08-04 DIAGNOSIS — Z86718 Personal history of other venous thrombosis and embolism: Secondary | ICD-10-CM | POA: Diagnosis not present

## 2021-08-04 DIAGNOSIS — Z86711 Personal history of pulmonary embolism: Secondary | ICD-10-CM | POA: Diagnosis not present

## 2021-08-04 DIAGNOSIS — E7800 Pure hypercholesterolemia, unspecified: Secondary | ICD-10-CM | POA: Diagnosis not present

## 2021-08-04 DIAGNOSIS — Z923 Personal history of irradiation: Secondary | ICD-10-CM | POA: Diagnosis not present

## 2021-08-04 DIAGNOSIS — Z87891 Personal history of nicotine dependence: Secondary | ICD-10-CM | POA: Diagnosis not present

## 2021-08-04 DIAGNOSIS — I700 Atherosclerosis of aorta: Secondary | ICD-10-CM | POA: Diagnosis not present

## 2021-08-04 DIAGNOSIS — Z79899 Other long term (current) drug therapy: Secondary | ICD-10-CM | POA: Diagnosis not present

## 2021-08-04 DIAGNOSIS — Z5112 Encounter for antineoplastic immunotherapy: Secondary | ICD-10-CM | POA: Diagnosis not present

## 2021-08-04 DIAGNOSIS — M79674 Pain in right toe(s): Secondary | ICD-10-CM | POA: Diagnosis not present

## 2021-08-04 DIAGNOSIS — E876 Hypokalemia: Secondary | ICD-10-CM | POA: Diagnosis not present

## 2021-08-04 DIAGNOSIS — C799 Secondary malignant neoplasm of unspecified site: Secondary | ICD-10-CM

## 2021-08-04 DIAGNOSIS — I10 Essential (primary) hypertension: Secondary | ICD-10-CM | POA: Diagnosis not present

## 2021-08-04 DIAGNOSIS — Z5189 Encounter for other specified aftercare: Secondary | ICD-10-CM | POA: Diagnosis not present

## 2021-08-04 DIAGNOSIS — L89312 Pressure ulcer of right buttock, stage 2: Secondary | ICD-10-CM

## 2021-08-04 DIAGNOSIS — R609 Edema, unspecified: Secondary | ICD-10-CM | POA: Diagnosis not present

## 2021-08-04 MED ORDER — HEPARIN SOD (PORK) LOCK FLUSH 100 UNIT/ML IV SOLN
500.0000 [IU] | Freq: Once | INTRAVENOUS | Status: AC | PRN
  Administered 2021-08-04: 500 [IU]
  Filled 2021-08-04: qty 5

## 2021-08-04 MED ORDER — SODIUM CHLORIDE 0.9% FLUSH
10.0000 mL | INTRAVENOUS | Status: DC | PRN
  Administered 2021-08-04: 10 mL
  Filled 2021-08-04: qty 10

## 2021-08-04 MED ORDER — HEPARIN SOD (PORK) LOCK FLUSH 100 UNIT/ML IV SOLN
INTRAVENOUS | Status: AC
  Filled 2021-08-04: qty 5

## 2021-08-04 MED ORDER — PEGFILGRASTIM 6 MG/0.6ML ~~LOC~~ PSKT
6.0000 mg | PREFILLED_SYRINGE | Freq: Once | SUBCUTANEOUS | Status: AC
  Administered 2021-08-04: 6 mg via SUBCUTANEOUS
  Filled 2021-08-04: qty 0.6

## 2021-08-04 NOTE — Patient Instructions (Signed)
[name]  Discharge Instructions: Thank you for choosing [facility] to provide your oncology and hematology care.  If you have a lab appointment with the [facility], please go directly to the [facility] and check in at the registration area.  Wear comfortable clothing and clothing appropriate for easy access to any Portacath or PICC line.   We strive to give you quality time with your provider. You may need to reschedule your appointment if you arrive late (15 or more minutes).  Arriving late affects you and other patients whose appointments are after yours.  Also, if you miss three or more appointments without notifying the office, you may be dismissed from the clinic at the provider's discretion.      For prescription refill requests, have your pharmacy contact our office and allow 72 hours for refills to be completed.    Today you received the following chemotherapy and/or immunotherapy agents NEULASTA      To help prevent nausea and vomiting after your treatment, we encourage you to take your nausea medication as directed.  BELOW ARE SYMPTOMS THAT SHOULD BE REPORTED IMMEDIATELY: *FEVER GREATER THAN 100.4 F (38 C) OR HIGHER *CHILLS OR SWEATING *NAUSEA AND VOMITING THAT IS NOT CONTROLLED WITH YOUR NAUSEA MEDICATION *UNUSUAL SHORTNESS OF BREATH *UNUSUAL BRUISING OR BLEEDING *URINARY PROBLEMS (pain or burning when urinating, or frequent urination) *BOWEL PROBLEMS (unusual diarrhea, constipation, pain near the anus) TENDERNESS IN MOUTH AND THROAT WITH OR WITHOUT PRESENCE OF ULCERS (sore throat, sores in mouth, or a toothache) UNUSUAL RASH, SWELLING OR PAIN  UNUSUAL VAGINAL DISCHARGE OR ITCHING   Items with * indicate a potential emergency and should be followed up as soon as possible or go to the Emergency Department if any problems should occur.  Please show the CHEMOTHERAPY ALERT CARD or IMMUNOTHERAPY ALERT CARD at check-in to  the Emergency Department and triage nurse.  Should you have questions after your visit or need to cancel or reschedule your appointment, please contact [facility]  [phone] and follow the prompts.  Office hours are 8:00 a.m. to 4:30 p.m. Monday - Friday. Please note that voicemails left after 4:00 p.m. may not be returned until the following business day.  We are closed weekends and major holidays. You have access to a nurse at all times for urgent questions. Please call the main number to the clinic [phone] and follow the prompts.  For any non-urgent questions, you may also contact your provider using MyChart. We now offer e-Visits for anyone 55 and older to request care online for non-urgent symptoms. For details visit [email or web address].   Also download the MyChart app! Go to the app store, search "MyChart", open the app, select [facility], and log in with your MyChart username and password.  Due to Covid, a mask is required upon entering the hospital/clinic. If you do not have a mask, one will be given to you upon arrival. For doctor visits, patients may have 1 support person aged 27 or older with them. For treatment visits, patients cannot have anyone with them due to current Covid guidelines and our immunocompromised population.   Pegfilgrastim injection What is this medication? PEGFILGRASTIM (PEG fil gra stim) is a long-acting granulocyte colony-stimulating factor that stimulates the growth of neutrophils, a type of white blood cell important in the body's fight against infection. It is used to reduce the incidence of fever and infection in patients with certain types of cancer who  are receiving chemotherapy that affects the bone marrow, and to increase survival after being exposed to high doses of radiation. This medicine may be used for other purposes; ask your health care provider or pharmacist if you have questions. COMMON BRAND NAME(S):  Rexene Edison, Ziextenzo What should I tell my care team before I take this medication? They need to know if you have any of these conditions: kidney disease latex allergy ongoing radiation therapy sickle cell disease skin reactions to acrylic adhesives (On-Body Injector only) an unusual or allergic reaction to pegfilgrastim, filgrastim, other medicines, foods, dyes, or preservatives pregnant or trying to get pregnant breast-feeding How should I use this medication? This medicine is for injection under the skin. If you get this medicine at home, you will be taught how to prepare and give the pre-filled syringe or how to use the On-body Injector. Refer to the patient Instructions for Use for detailed instructions. Use exactly as directed. Tell your healthcare provider immediately if you suspect that the On-body Injector may not have performed as intended or if you suspect the use of the On-body Injector resulted in a missed or partial dose. It is important that you put your used needles and syringes in a special sharps container. Do not put them in a trash can. If you do not have a sharps container, call your pharmacist or healthcare provider to get one. Talk to your pediatrician regarding the use of this medicine in children. While this drug may be prescribed for selected conditions, precautions do apply. Overdosage: If you think you have taken too much of this medicine contact a poison control center or emergency room at once. NOTE: This medicine is only for you. Do not share this medicine with others. What if I miss a dose? It is important not to miss your dose. Call your doctor or health care professional if you miss your dose. If you miss a dose due to an On-body Injector failure or leakage, a new dose should be administered as soon as possible using a single prefilled syringe for manual use. What may interact with this medication? Interactions have not been  studied. This list may not describe all possible interactions. Give your health care provider a list of all the medicines, herbs, non-prescription drugs, or dietary supplements you use. Also tell them if you smoke, drink alcohol, or use illegal drugs. Some items may interact with your medicine. What should I watch for while using this medication? Your condition will be monitored carefully while you are receiving this medicine. You may need blood work done while you are taking this medicine. Talk to your health care provider about your risk of cancer. You may be more at risk for certain types of cancer if you take this medicine. If you are going to need a MRI, CT scan, or other procedure, tell your doctor that you are using this medicine (On-Body Injector only). What side effects may I notice from receiving this medication? Side effects that you should report to your doctor or health care professional as soon as possible: allergic reactions (skin rash, itching or hives, swelling of the face, lips, or tongue) back pain dizziness fever pain, redness, or irritation at site where injected pinpoint red spots on the skin red or dark-brown urine shortness of breath or breathing problems stomach or side pain, or pain at the shoulder swelling tiredness trouble passing urine or change in the amount of urine unusual bruising or bleeding Side effects that usually do not  require medical attention (report to your doctor or health care professional if they continue or are bothersome): bone pain muscle pain This list may not describe all possible side effects. Call your doctor for medical advice about side effects. You may report side effects to FDA at 1-800-FDA-1088. Where should I keep my medication? Keep out of the reach of children. If you are using this medicine at home, you will be instructed on how to store it. Throw away any unused medicine after the expiration date on the label. NOTE: This sheet  is a summary. It may not cover all possible information. If you have questions about this medicine, talk to your doctor, pharmacist, or health care provider.  2022 Elsevier/Gold Standard (2020-10-29 11:54:14)

## 2021-08-06 DIAGNOSIS — E876 Hypokalemia: Secondary | ICD-10-CM | POA: Diagnosis not present

## 2021-08-06 DIAGNOSIS — D701 Agranulocytosis secondary to cancer chemotherapy: Secondary | ICD-10-CM | POA: Diagnosis not present

## 2021-08-06 DIAGNOSIS — I825Z3 Chronic embolism and thrombosis of unspecified deep veins of distal lower extremity, bilateral: Secondary | ICD-10-CM | POA: Diagnosis not present

## 2021-08-06 DIAGNOSIS — K219 Gastro-esophageal reflux disease without esophagitis: Secondary | ICD-10-CM | POA: Diagnosis not present

## 2021-08-06 DIAGNOSIS — Z933 Colostomy status: Secondary | ICD-10-CM | POA: Diagnosis not present

## 2021-08-06 DIAGNOSIS — Z9181 History of falling: Secondary | ICD-10-CM | POA: Diagnosis not present

## 2021-08-06 DIAGNOSIS — N182 Chronic kidney disease, stage 2 (mild): Secondary | ICD-10-CM | POA: Diagnosis not present

## 2021-08-06 DIAGNOSIS — C7982 Secondary malignant neoplasm of genital organs: Secondary | ICD-10-CM | POA: Diagnosis not present

## 2021-08-06 DIAGNOSIS — T451X5D Adverse effect of antineoplastic and immunosuppressive drugs, subsequent encounter: Secondary | ICD-10-CM | POA: Diagnosis not present

## 2021-08-06 DIAGNOSIS — M1990 Unspecified osteoarthritis, unspecified site: Secondary | ICD-10-CM | POA: Diagnosis not present

## 2021-08-06 DIAGNOSIS — I129 Hypertensive chronic kidney disease with stage 1 through stage 4 chronic kidney disease, or unspecified chronic kidney disease: Secondary | ICD-10-CM | POA: Diagnosis not present

## 2021-08-06 DIAGNOSIS — Z4800 Encounter for change or removal of nonsurgical wound dressing: Secondary | ICD-10-CM | POA: Diagnosis not present

## 2021-08-06 DIAGNOSIS — Z7901 Long term (current) use of anticoagulants: Secondary | ICD-10-CM | POA: Diagnosis not present

## 2021-08-06 DIAGNOSIS — Z7951 Long term (current) use of inhaled steroids: Secondary | ICD-10-CM | POA: Diagnosis not present

## 2021-08-06 DIAGNOSIS — E782 Mixed hyperlipidemia: Secondary | ICD-10-CM | POA: Diagnosis not present

## 2021-08-06 DIAGNOSIS — L989 Disorder of the skin and subcutaneous tissue, unspecified: Secondary | ICD-10-CM | POA: Diagnosis not present

## 2021-08-06 DIAGNOSIS — C20 Malignant neoplasm of rectum: Secondary | ICD-10-CM | POA: Diagnosis not present

## 2021-08-06 DIAGNOSIS — L89312 Pressure ulcer of right buttock, stage 2: Secondary | ICD-10-CM | POA: Diagnosis not present

## 2021-08-06 DIAGNOSIS — Z87891 Personal history of nicotine dependence: Secondary | ICD-10-CM | POA: Diagnosis not present

## 2021-08-09 DIAGNOSIS — H903 Sensorineural hearing loss, bilateral: Secondary | ICD-10-CM | POA: Diagnosis not present

## 2021-08-10 ENCOUNTER — Encounter: Payer: Self-pay | Admitting: Podiatry

## 2021-08-10 NOTE — Progress Notes (Signed)
  Subjective:  Patient ID: Jacqueline Yoder, female    DOB: 13-Aug-1946,  MRN: [id number]  Chief Complaint  Patient presents with   Nail Problem    Nail trim    75 y.o. female returns for the above complaint.  Patient presents with thickened elongated dystrophic toenails x10.  Patient states painful to touch.  She is not able to debride herself.  She would like for me to do it.  She denies any other acute complaints.  She states that she is on Eliquis from previous history of PE.  Objective:  There were no vitals filed for this visit. Podiatric Exam: Vascular: dorsalis pedis and posterior tibial pulses are palpable bilateral. Capillary return is immediate. Temperature gradient is WNL. Skin turgor WNL  Sensorium: Normal Semmes Weinstein monofilament test. Normal tactile sensation bilaterally. Nail Exam: Pt has thick disfigured discolored nails with subungual debris noted bilateral entire nail hallux through fifth toenails.  Pain on palpation to the nails. Ulcer Exam: There is no evidence of ulcer or pre-ulcerative changes or infection. Orthopedic Exam: Muscle tone and strength are WNL. No limitations in general ROM. No crepitus or effusions noted. HAV  B/L.  Hammer toes 2-5  B/L. Skin: No Porokeratosis. No infection or ulcers    Assessment & Plan:   1. Coagulation defect ([place])   2. Pain due to onychomycosis of toenails of both feet      Patient was evaluated and treated and all questions answered.  Onychomycosis with pain  -Nails palliatively debrided as below. -Educated on self-care  Procedure: Nail Debridement Rationale: pain  Type of Debridement: manual, sharp debridement. Instrumentation: Nail nipper, rotary burr. Number of Nails: 10  Procedures and Treatment: Consent by patient was obtained for treatment procedures. The patient understood the discussion of treatment and procedures well. All questions were answered thoroughly reviewed. Debridement of mycotic and hypertrophic  toenails, 1 through 5 bilateral and clearing of subungual debris. No ulceration, no infection noted.  Return Visit-Office Procedure: Patient instructed to return to the office for a follow up visit 3 months for continued evaluation and treatment.  Boneta Lucks, DPM    No follow-ups on file.

## 2021-08-11 ENCOUNTER — Telehealth: Payer: Self-pay | Admitting: Family Medicine

## 2021-08-11 DIAGNOSIS — E876 Hypokalemia: Secondary | ICD-10-CM | POA: Diagnosis not present

## 2021-08-11 DIAGNOSIS — C7982 Secondary malignant neoplasm of genital organs: Secondary | ICD-10-CM | POA: Diagnosis not present

## 2021-08-11 DIAGNOSIS — D701 Agranulocytosis secondary to cancer chemotherapy: Secondary | ICD-10-CM | POA: Diagnosis not present

## 2021-08-11 DIAGNOSIS — C20 Malignant neoplasm of rectum: Secondary | ICD-10-CM | POA: Diagnosis not present

## 2021-08-11 DIAGNOSIS — L989 Disorder of the skin and subcutaneous tissue, unspecified: Secondary | ICD-10-CM | POA: Diagnosis not present

## 2021-08-11 DIAGNOSIS — E782 Mixed hyperlipidemia: Secondary | ICD-10-CM | POA: Diagnosis not present

## 2021-08-11 DIAGNOSIS — K219 Gastro-esophageal reflux disease without esophagitis: Secondary | ICD-10-CM | POA: Diagnosis not present

## 2021-08-11 DIAGNOSIS — Z7951 Long term (current) use of inhaled steroids: Secondary | ICD-10-CM | POA: Diagnosis not present

## 2021-08-11 DIAGNOSIS — M1990 Unspecified osteoarthritis, unspecified site: Secondary | ICD-10-CM | POA: Diagnosis not present

## 2021-08-11 DIAGNOSIS — Z87891 Personal history of nicotine dependence: Secondary | ICD-10-CM | POA: Diagnosis not present

## 2021-08-11 DIAGNOSIS — Z7901 Long term (current) use of anticoagulants: Secondary | ICD-10-CM | POA: Diagnosis not present

## 2021-08-11 DIAGNOSIS — T451X5D Adverse effect of antineoplastic and immunosuppressive drugs, subsequent encounter: Secondary | ICD-10-CM | POA: Diagnosis not present

## 2021-08-11 DIAGNOSIS — Z4800 Encounter for change or removal of nonsurgical wound dressing: Secondary | ICD-10-CM | POA: Diagnosis not present

## 2021-08-11 DIAGNOSIS — Z9181 History of falling: Secondary | ICD-10-CM | POA: Diagnosis not present

## 2021-08-11 DIAGNOSIS — I129 Hypertensive chronic kidney disease with stage 1 through stage 4 chronic kidney disease, or unspecified chronic kidney disease: Secondary | ICD-10-CM | POA: Diagnosis not present

## 2021-08-11 DIAGNOSIS — L89312 Pressure ulcer of right buttock, stage 2: Secondary | ICD-10-CM | POA: Diagnosis not present

## 2021-08-11 DIAGNOSIS — Z933 Colostomy status: Secondary | ICD-10-CM | POA: Diagnosis not present

## 2021-08-11 DIAGNOSIS — N182 Chronic kidney disease, stage 2 (mild): Secondary | ICD-10-CM | POA: Diagnosis not present

## 2021-08-11 DIAGNOSIS — I825Z3 Chronic embolism and thrombosis of unspecified deep veins of distal lower extremity, bilateral: Secondary | ICD-10-CM | POA: Diagnosis not present

## 2021-08-11 NOTE — Telephone Encounter (Signed)
She needs foam dressing for her bottom, the patient has a wound on her flank. It has already been ordered but the size is too small  [facility] calling from [facility] contact: [phone]

## 2021-08-12 NOTE — Telephone Encounter (Signed)
We do not typically order the size of the dressing.  This is something at home health should be able to supply.

## 2021-08-13 DIAGNOSIS — M1990 Unspecified osteoarthritis, unspecified site: Secondary | ICD-10-CM | POA: Diagnosis not present

## 2021-08-13 DIAGNOSIS — Z933 Colostomy status: Secondary | ICD-10-CM | POA: Diagnosis not present

## 2021-08-13 DIAGNOSIS — I825Z3 Chronic embolism and thrombosis of unspecified deep veins of distal lower extremity, bilateral: Secondary | ICD-10-CM | POA: Diagnosis not present

## 2021-08-13 DIAGNOSIS — Z9181 History of falling: Secondary | ICD-10-CM | POA: Diagnosis not present

## 2021-08-13 DIAGNOSIS — Z7951 Long term (current) use of inhaled steroids: Secondary | ICD-10-CM | POA: Diagnosis not present

## 2021-08-13 DIAGNOSIS — D701 Agranulocytosis secondary to cancer chemotherapy: Secondary | ICD-10-CM | POA: Diagnosis not present

## 2021-08-13 DIAGNOSIS — T451X5D Adverse effect of antineoplastic and immunosuppressive drugs, subsequent encounter: Secondary | ICD-10-CM | POA: Diagnosis not present

## 2021-08-13 DIAGNOSIS — L989 Disorder of the skin and subcutaneous tissue, unspecified: Secondary | ICD-10-CM | POA: Diagnosis not present

## 2021-08-13 DIAGNOSIS — Z4800 Encounter for change or removal of nonsurgical wound dressing: Secondary | ICD-10-CM | POA: Diagnosis not present

## 2021-08-13 DIAGNOSIS — C7982 Secondary malignant neoplasm of genital organs: Secondary | ICD-10-CM | POA: Diagnosis not present

## 2021-08-13 DIAGNOSIS — E782 Mixed hyperlipidemia: Secondary | ICD-10-CM | POA: Diagnosis not present

## 2021-08-13 DIAGNOSIS — E876 Hypokalemia: Secondary | ICD-10-CM | POA: Diagnosis not present

## 2021-08-13 DIAGNOSIS — N182 Chronic kidney disease, stage 2 (mild): Secondary | ICD-10-CM | POA: Diagnosis not present

## 2021-08-13 DIAGNOSIS — K219 Gastro-esophageal reflux disease without esophagitis: Secondary | ICD-10-CM | POA: Diagnosis not present

## 2021-08-13 DIAGNOSIS — I129 Hypertensive chronic kidney disease with stage 1 through stage 4 chronic kidney disease, or unspecified chronic kidney disease: Secondary | ICD-10-CM | POA: Diagnosis not present

## 2021-08-13 DIAGNOSIS — L89312 Pressure ulcer of right buttock, stage 2: Secondary | ICD-10-CM | POA: Diagnosis not present

## 2021-08-13 DIAGNOSIS — Z7901 Long term (current) use of anticoagulants: Secondary | ICD-10-CM | POA: Diagnosis not present

## 2021-08-13 DIAGNOSIS — C20 Malignant neoplasm of rectum: Secondary | ICD-10-CM | POA: Diagnosis not present

## 2021-08-13 DIAGNOSIS — Z87891 Personal history of nicotine dependence: Secondary | ICD-10-CM | POA: Diagnosis not present

## 2021-08-16 ENCOUNTER — Inpatient Hospital Stay: Payer: Medicare Other | Attending: Oncology

## 2021-08-16 ENCOUNTER — Inpatient Hospital Stay: Payer: Medicare Other

## 2021-08-16 ENCOUNTER — Other Ambulatory Visit: Payer: Self-pay

## 2021-08-16 ENCOUNTER — Inpatient Hospital Stay: Payer: Medicare Other | Admitting: Oncology

## 2021-08-16 ENCOUNTER — Encounter: Payer: Self-pay | Admitting: Oncology

## 2021-08-16 VITALS — BP 111/68 | HR 90 | Temp 98.5°F | Wt 156.1 lb

## 2021-08-16 DIAGNOSIS — Z90710 Acquired absence of both cervix and uterus: Secondary | ICD-10-CM | POA: Insufficient documentation

## 2021-08-16 DIAGNOSIS — Z5111 Encounter for antineoplastic chemotherapy: Secondary | ICD-10-CM | POA: Insufficient documentation

## 2021-08-16 DIAGNOSIS — Z5189 Encounter for other specified aftercare: Secondary | ICD-10-CM | POA: Diagnosis not present

## 2021-08-16 DIAGNOSIS — Z79899 Other long term (current) drug therapy: Secondary | ICD-10-CM | POA: Insufficient documentation

## 2021-08-16 DIAGNOSIS — K219 Gastro-esophageal reflux disease without esophagitis: Secondary | ICD-10-CM | POA: Insufficient documentation

## 2021-08-16 DIAGNOSIS — Z933 Colostomy status: Secondary | ICD-10-CM | POA: Diagnosis not present

## 2021-08-16 DIAGNOSIS — Z95828 Presence of other vascular implants and grafts: Secondary | ICD-10-CM

## 2021-08-16 DIAGNOSIS — Z86718 Personal history of other venous thrombosis and embolism: Secondary | ICD-10-CM | POA: Diagnosis not present

## 2021-08-16 DIAGNOSIS — T451X5A Adverse effect of antineoplastic and immunosuppressive drugs, initial encounter: Secondary | ICD-10-CM | POA: Diagnosis not present

## 2021-08-16 DIAGNOSIS — C799 Secondary malignant neoplasm of unspecified site: Secondary | ICD-10-CM

## 2021-08-16 DIAGNOSIS — I700 Atherosclerosis of aorta: Secondary | ICD-10-CM | POA: Insufficient documentation

## 2021-08-16 DIAGNOSIS — Z86711 Personal history of pulmonary embolism: Secondary | ICD-10-CM

## 2021-08-16 DIAGNOSIS — R609 Edema, unspecified: Secondary | ICD-10-CM | POA: Diagnosis not present

## 2021-08-16 DIAGNOSIS — Z7901 Long term (current) use of anticoagulants: Secondary | ICD-10-CM | POA: Diagnosis not present

## 2021-08-16 DIAGNOSIS — Z800 Family history of malignant neoplasm of digestive organs: Secondary | ICD-10-CM | POA: Diagnosis not present

## 2021-08-16 DIAGNOSIS — K521 Toxic gastroenteritis and colitis: Secondary | ICD-10-CM | POA: Diagnosis not present

## 2021-08-16 DIAGNOSIS — C20 Malignant neoplasm of rectum: Secondary | ICD-10-CM | POA: Diagnosis not present

## 2021-08-16 DIAGNOSIS — I10 Essential (primary) hypertension: Secondary | ICD-10-CM | POA: Diagnosis not present

## 2021-08-16 DIAGNOSIS — Z90722 Acquired absence of ovaries, bilateral: Secondary | ICD-10-CM | POA: Insufficient documentation

## 2021-08-16 DIAGNOSIS — C7982 Secondary malignant neoplasm of genital organs: Secondary | ICD-10-CM | POA: Insufficient documentation

## 2021-08-16 DIAGNOSIS — Z87891 Personal history of nicotine dependence: Secondary | ICD-10-CM | POA: Insufficient documentation

## 2021-08-16 DIAGNOSIS — E876 Hypokalemia: Secondary | ICD-10-CM

## 2021-08-16 DIAGNOSIS — I2510 Atherosclerotic heart disease of native coronary artery without angina pectoris: Secondary | ICD-10-CM | POA: Insufficient documentation

## 2021-08-16 DIAGNOSIS — E7800 Pure hypercholesterolemia, unspecified: Secondary | ICD-10-CM | POA: Diagnosis not present

## 2021-08-16 LAB — COMPREHENSIVE METABOLIC PANEL
ALT: 24 U/L (ref 0–44)
AST: 33 U/L (ref 15–41)
Albumin: 4.1 g/dL (ref 3.5–5.0)
Alkaline Phosphatase: 84 U/L (ref 38–126)
Anion gap: 9 (ref 5–15)
BUN: 8 mg/dL (ref 8–23)
CO2: 26 mmol/L (ref 22–32)
Calcium: 9.1 mg/dL (ref 8.9–10.3)
Chloride: 101 mmol/L (ref 98–111)
Creatinine, Ser: 1.15 mg/dL — ABNORMAL HIGH (ref 0.44–1.00)
GFR, Estimated: 50 mL/min — ABNORMAL LOW (ref >60)
Glucose, Bld: 128 mg/dL — ABNORMAL HIGH (ref 70–99)
Potassium: 3.2 mmol/L — ABNORMAL LOW (ref 3.5–5.1)
Sodium: 136 mmol/L (ref 135–145)
Total Bilirubin: 0.4 mg/dL (ref 0.3–1.2)
Total Protein: 6.7 g/dL (ref 6.5–8.1)

## 2021-08-16 LAB — CBC WITH DIFFERENTIAL/PLATELET
Abs Immature Granulocytes: 0.08 10*3/uL — ABNORMAL HIGH (ref 0.00–0.07)
Basophils Absolute: 0 10*3/uL (ref 0.0–0.1)
Basophils Relative: 1 %
Eosinophils Absolute: 0.1 10*3/uL (ref 0.0–0.5)
Eosinophils Relative: 1 %
HCT: 36.1 % (ref 36.0–46.0)
Hemoglobin: 11.5 g/dL — ABNORMAL LOW (ref 12.0–15.0)
Immature Granulocytes: 2 %
Lymphocytes Relative: 24 %
Lymphs Abs: 1.2 10*3/uL (ref 0.7–4.0)
MCH: 30.3 pg (ref 26.0–34.0)
MCHC: 31.9 g/dL (ref 30.0–36.0)
MCV: 95 fL (ref 80.0–100.0)
Monocytes Absolute: 0.6 10*3/uL (ref 0.1–1.0)
Monocytes Relative: 12 %
Neutro Abs: 2.9 10*3/uL (ref 1.7–7.7)
Neutrophils Relative %: 60 %
Platelets: 209 10*3/uL (ref 150–400)
RBC: 3.8 MIL/uL — ABNORMAL LOW (ref 3.87–5.11)
RDW: 17 % — ABNORMAL HIGH (ref 11.5–15.5)
WBC: 4.9 10*3/uL (ref 4.0–10.5)
nRBC: 0.6 % — ABNORMAL HIGH (ref 0.0–0.2)

## 2021-08-16 LAB — PROTEIN, URINE, RANDOM: Total Protein, Urine: 25 mg/dL

## 2021-08-16 MED ORDER — SODIUM CHLORIDE 0.9 % IV SOLN
125.0000 mg/m2 | Freq: Once | INTRAVENOUS | Status: AC
  Administered 2021-08-16: 220 mg via INTRAVENOUS
  Filled 2021-08-16: qty 10

## 2021-08-16 MED ORDER — SODIUM CHLORIDE 0.9 % IV SOLN
2400.0000 mg/m2 | INTRAVENOUS | Status: DC
  Administered 2021-08-16: 4300 mg via INTRAVENOUS
  Filled 2021-08-16: qty 86

## 2021-08-16 MED ORDER — SODIUM CHLORIDE 0.9 % IV SOLN
Freq: Once | INTRAVENOUS | Status: AC
  Filled 2021-08-16: qty 250

## 2021-08-16 MED ORDER — PALONOSETRON HCL INJECTION 0.25 MG/5ML
0.2500 mg | Freq: Once | INTRAVENOUS | Status: AC
  Administered 2021-08-16: 0.25 mg via INTRAVENOUS
  Filled 2021-08-16: qty 5

## 2021-08-16 MED ORDER — POTASSIUM CHLORIDE CRYS ER 20 MEQ PO TBCR
20.0000 meq | EXTENDED_RELEASE_TABLET | Freq: Two times a day (BID) | ORAL | 1 refills | Status: DC
Start: 2021-08-16 — End: 2021-09-26

## 2021-08-16 MED ORDER — SODIUM CHLORIDE 0.9 % IV SOLN
750.0000 mg | Freq: Once | INTRAVENOUS | Status: AC
  Administered 2021-08-16: 750 mg via INTRAVENOUS
  Filled 2021-08-16: qty 37.5

## 2021-08-16 MED ORDER — MONTELUKAST SODIUM 10 MG PO TABS
10.0000 mg | ORAL_TABLET | Freq: Once | ORAL | Status: AC
  Administered 2021-08-16: 10 mg via ORAL
  Filled 2021-08-16: qty 1

## 2021-08-16 MED ORDER — ATROPINE SULFATE 1 MG/ML IV SOLN
0.5000 mg | Freq: Once | INTRAVENOUS | Status: AC
  Administered 2021-08-16: 0.5 mg via INTRAVENOUS
  Filled 2021-08-16: qty 1

## 2021-08-16 MED ORDER — DIPHENHYDRAMINE HCL 50 MG/ML IJ SOLN
25.0000 mg | Freq: Every day | INTRAMUSCULAR | Status: DC | PRN
  Administered 2021-08-16: 25 mg via INTRAVENOUS
  Filled 2021-08-16: qty 1

## 2021-08-16 MED ORDER — SODIUM CHLORIDE 0.9 % IV SOLN
5.0000 mg/kg | Freq: Once | INTRAVENOUS | Status: AC
  Administered 2021-08-16: 350 mg via INTRAVENOUS
  Filled 2021-08-16: qty 14

## 2021-08-16 MED ORDER — SODIUM CHLORIDE 0.9 % IV SOLN
10.0000 mg | Freq: Once | INTRAVENOUS | Status: AC
  Administered 2021-08-16: 10 mg via INTRAVENOUS
  Filled 2021-08-16: qty 10

## 2021-08-16 MED ORDER — SODIUM CHLORIDE 0.9 % IV SOLN
5.0000 mg/kg | Freq: Once | INTRAVENOUS | Status: DC
  Filled 2021-08-16: qty 16

## 2021-08-16 MED ORDER — SODIUM CHLORIDE 0.9% FLUSH
10.0000 mL | Freq: Once | INTRAVENOUS | Status: AC
  Administered 2021-08-16: 10 mL via INTRAVENOUS
  Filled 2021-08-16: qty 10

## 2021-08-16 NOTE — Patient Instructions (Signed)
[facility]  Discharge Instructions: Thank you for choosing [facility] to provide your oncology and hematology care.  If you have a lab appointment with the [facility], please go directly to the [facility] and check in at the registration area.  Wear comfortable clothing and clothing appropriate for easy access to any Portacath or PICC line.   We strive to give you quality time with your provider. You may need to reschedule your appointment if you arrive late (15 or more minutes).  Arriving late affects you and other patients whose appointments are after yours.  Also, if you miss three or more appointments without notifying the office, you may be dismissed from the clinic at the provider's discretion.      For prescription refill requests, have your pharmacy contact our office and allow 72 hours for refills to be completed.    Today you received the following chemotherapy and/or immunotherapy agents : Avastin / Folfiri   To help prevent nausea and vomiting after your treatment, we encourage you to take your nausea medication as directed.  BELOW ARE SYMPTOMS THAT SHOULD BE REPORTED IMMEDIATELY: *FEVER GREATER THAN 100.4 F (38 C) OR HIGHER *CHILLS OR SWEATING *NAUSEA AND VOMITING THAT IS NOT CONTROLLED WITH YOUR NAUSEA MEDICATION *UNUSUAL SHORTNESS OF BREATH *UNUSUAL BRUISING OR BLEEDING *URINARY PROBLEMS (pain or burning when urinating, or frequent urination) *BOWEL PROBLEMS (unusual diarrhea, constipation, pain near the anus) TENDERNESS IN MOUTH AND THROAT WITH OR WITHOUT PRESENCE OF ULCERS (sore throat, sores in mouth, or a toothache) UNUSUAL RASH, SWELLING OR PAIN  UNUSUAL VAGINAL DISCHARGE OR ITCHING   Items with * indicate a potential emergency and should be followed up as soon as possible or go to the Emergency Department if any problems should occur.  Please show the CHEMOTHERAPY ALERT CARD or IMMUNOTHERAPY ALERT CARD at  check-in to the Emergency Department and triage nurse.  Should you have questions after your visit or need to cancel or reschedule your appointment, please contact [facility]  [phone] and follow the prompts.  Office hours are 8:00 a.m. to 4:30 p.m. Monday - Friday. Please note that voicemails left after 4:00 p.m. may not be returned until the following business day.  We are closed weekends and major holidays. You have access to a nurse at all times for urgent questions. Please call the main number to the clinic [phone] and follow the prompts.  For any non-urgent questions, you may also contact your provider using MyChart. We now offer e-Visits for anyone 74 and older to request care online for non-urgent symptoms. For details visit [email or web address].   Also download the MyChart app! Go to the app store, search "MyChart", open the app, select [facility], and log in with your MyChart username and password.  Due to Covid, a mask is required upon entering the hospital/clinic. If you do not have a mask, one will be given to you upon arrival. For doctor visits, patients may have 1 support person aged 68 or older with them. For treatment visits, patients cannot have anyone with them due to current Covid guidelines and our immunocompromised population.

## 2021-08-16 NOTE — Progress Notes (Signed)
Hematology/Oncology progress note [facility] [phone] [phone]   Patient Care Team: Karamalegos, Alexander J, DO as PCP - General (Family Medicine) Stanton, Kristi D, RN as Registered Nurse Breahna Boylen, MD as Consulting Physician (Hematology and Oncology)  CHIEF COMPLAINTS/REASON FOR VISIT:  Follow up for rectal cancer  HISTORY OF PRESENTING ILLNESS:  Patient initially presented with complaints of postmenopausal bleeding on 08/16/2018.  History of was menopausal vaginal bleeding in 2016 which resulted in cervical polypectomy.  Pathology 02/04/2015 showed cervical polyp, consistent with benign endometrial polyp.  [name] after polypectomy due to anxiety associated with pelvic exams.  pelvic exam on 08/16/2018 reviewed cervical abnormality and from enlarged uterus. Seen by Dr. Cherry on 10/29/2018.  Endometrial biopsy and a Pap smear was performed. 10/29/2018 Pap smear showed adenocarcinoma, favor endometrial origin. 10/29/2018 endometrial biopsy showed endometrioid carcinoma, FIGO grade 1.  10/29/2018- TA & TV Ultrasound revealed: Anteverted uterus measuring 8.7 x 5.6 x 6.4 cm without evidence of focal masses.  The endometrium measuring 24.1 mm (thickened) and heterogeneous.  Right and left ovaries not visualized.  No adnexal masses identified.  No free fluid in cul-de-sac.  Patient was seen by Dr. Secord in clinic on 11/13/2018.  Cervical exam reveals 2 cm exophytic irregular mass consistent with malignancy.   11/19/2018 CT chest abdomen pelvis with contrast showed thickened endometrium with some irregularity compatible with the provided diagnosis of endometrial malignancy.  There is a mildly prominent left inguinal node 1.4 cm.  Patient was seen by Dr. Berchuck on 11/20/2018 and left groin lymph node biopsy was recommended.  11/26/2018 patient underwent left inguinal lymph node biopsy. Pathology showed metastatic adenocarcinoma consistent  with colorectal origin.  CDX 2+.  Case was discussed on tumor board.  Recommend colonoscopy for further evaluation.  Patient reports significant weight loss 30 pounds over the last year.  Chronic vaginal spotting. Change of bowel habits the past few months.  More constipated.  Family history positive for brother who has colon cancer prostate cancer.  patient has underwent colonoscopy on 12/03/2018 which reviewed a nonobstructing large mass in the rectum.  Also chronic fistula.  Mass was not circumferential.  This was biopsied with a cold forceps for histology.  Pathology came back hyperplastic polyp negative for dysplasia and malignancy. [name], patient underwent flex sigmoidoscopy on 12/06/2018 with rebiopsy of the rectal mass. This time biopsy results came back positive for invasive colorectal adenocarcinoma, moderately differentiated. Immunotherapy for nearly mismatch repair protein (MMR ) was performed.  There is no loss of MMR expression.  low probability of MSI high.   # Seen by [name] for evaluation of resectability for rectal cancer. In addition, she also had a second opinion with [facility] where her endometrial biopsy pathology was changed to  adenocarcinoma, consistent with colorectal primary.   [name]. She has home health that has been assisting with ostomy care  Patient was also evaluated by [facility] oncology.  Recommendation is to proceed with TNT with concurrent chemoradiation followed by neoadjuvant chemotherapy followed by surgical resection. Patient prefers to have treatment done locally with [facility].   # Oncology Treatment:  02/03/2019- 03/19/2019  concurrent Xeloda and radiation.  Xeloda dose 825mg /m2 BID - rounded to 1650mg BID- on days of radiation. 04/09/2019, started on FOLFOX with bolus early.  Omitted.  07/16/2019 finished 8 cycles of FOLFOX. 09/17/19 APR/posterior vaginectomy/TAH/BSO/VY-flap pT4b pN0  with close vaginal margin 0.2 mm.  Uterus and ovaries   negative for malignancy. Patient reports bilateral lower extremity numbness and tingling, intermittent, left worse than right. She has lost a lot of weight since her APR surgery.   #Family history with half brother having's history of colon cancer prostate cancer.  Personal history of colorectal cancer.  Patient has not decided if she wants genetic testing.    # history of PE( 01/13/2020)  in the bilateral lower extremity DVT (01/13/2020).   She finishes 6 months of anticoagulation with Eliquis 5 mg twice daily. Now switched to Eliquis 2.5 mg twice daily..  # She has now developed recurrent disease. #06/30/20  vaginal introitus mass biopsied. Pathology is consistent with metastatic colorectal adenocarcinoma I have discussed with [name]  Dr. Hester Mates and the mass is not resectable. Patient has also had colonoscopy by Dr. Vicente Males yesterday. Normal examination. # 07/16/2020 cycle 1 FOLFIRI  # 07/20/2020 PET scan was done for further evaluation, images are consistent with local recurrence, no distant metastasis. #Discussed with radiation oncology Dr. Baruch Gouty will recommends concurrent chemotherapy and radiation. 08/02/2020-08/16/2020, patient starts radiation.  Xeloda was held due to neutropenia 08/17/2020,-09/06/2020 Xeloda 1500 mg twice daily concurrently with radiation  01/31/21 started on FOLFIRI + Bev 05/18/2021 CT chest abdomen pelvis showed Previously noted enlargement of bilateral inguinal lymph nodes is resolved, consistent with treatment response of nodal metastatic disease. Interval decrease in size of multiple small bilateral pulmonary nodules, consistent with treatment response of pulmonary metastatic disease. No evidence of new metastatic disease.  INTERVAL HISTORY Jacqueline Yoder is a 75 y.o. female who has above history reviewed by me presents for follow-up of rectal cancer. She tolerates chemotherapy well, diarrhea is manageable with  as needed Imodium.  Symptom has improved since the dose reduction of irinotecan Denies any nausea vomiting.  Patient has intermittent diarrhea, symptoms are manageable.  Patient takes antidiarrhea medications. Appetite is fair.  He has lost weight.   Review of Systems  Constitutional:  Positive for fatigue. Negative for appetite change, chills, fever and unexpected weight change.  HENT:   Negative for hearing loss and voice change.   Eyes:  Negative for eye problems.  Respiratory:  Negative for chest tightness and cough.   Cardiovascular:  Negative for chest pain.  Gastrointestinal:  Positive for diarrhea. Negative for abdominal distention, abdominal pain, blood in stool, constipation and nausea.  Endocrine: Negative for hot flashes.  Genitourinary:  Negative for difficulty urinating and frequency.   Musculoskeletal:  Negative for arthralgias.  Skin:  Negative for itching and rash.  Neurological:  Negative for extremity weakness and numbness.  Hematological:  Negative for adenopathy.  Psychiatric/Behavioral:  Negative for confusion.    MEDICAL HISTORY:  Past Medical History:  Diagnosis Date   Allergy    Arthritis    Blood clot in vein    Family history of colon cancer    GERD (gastroesophageal reflux disease)    Hypercholesteremia    Hypertension    Hypertension    Lower extremity edema    Rectal cancer ([place]) 12/2018   Urinary incontinence     SURGICAL HISTORY: Past Surgical History:  Procedure Laterality Date   ABDOMINAL HYSTERECTOMY     CHOLECYSTECTOMY  1971   COLONOSCOPY WITH PROPOFOL N/A 12/03/2018   Procedure: COLONOSCOPY WITH PROPOFOL;  Surgeon: Lucilla Lame, MD;  Location: [facility] ENDOSCOPY;  Service: Endoscopy;  Laterality: N/A;   COLONOSCOPY WITH PROPOFOL N/A 07/15/2020   Procedure: COLONOSCOPY WITH PROPOFOL;  Surgeon: [name], MD;  Location: [facility] ENDOSCOPY;  Service: Gastroenterology;  Laterality: N/A;  FLEXIBLE SIGMOIDOSCOPY N/A 12/06/2018   Procedure: FLEXIBLE  SIGMOIDOSCOPY;  Surgeon: [name], MD;  Location: [facility] ENDOSCOPY;  Service: Endoscopy;  Laterality: N/A;   LAPAROSCOPIC COLOSTOMY  01/06/2019   PORTACATH PLACEMENT N/A 04/03/2019   Procedure: INSERTION PORT-A-CATH;  Surgeon: Jules Husbands, MD;  Location: [facility] ORS;  Service: General;  Laterality: N/A;    SOCIAL HISTORY: Social History   Socioeconomic History   Marital status: Widowed    Spouse name: Not on file   Number of children: Not on file   Years of education: Not on file   Highest education level: Not on file  Occupational History   Not on file  Tobacco Use   Smoking status: Former    Types: Cigarettes    Quit date: 12/02/1977    Years since quitting: 43.7   Smokeless tobacco: Former  Scientific laboratory technician Use: Never used  Substance and Sexual Activity   Alcohol use: Never   Drug use: Never   Sexual activity: Not Currently    Birth control/protection: None  Other Topics Concern   Not on file  Social History Narrative   Lives with daughter   Social Determinants of Health   Financial Resource Strain: Low Risk    Difficulty of Paying Living Expenses: Not hard at all  Food Insecurity: No Food Insecurity   Worried About Charity fundraiser in the Last Year: Never true   [place] in the Last Year: Never true  Transportation Needs: No Transportation Needs   Lack of Transportation (Medical): No   Lack of Transportation (Non-Medical): No  Physical Activity: Inactive   Days of Exercise per Week: 0 days   Minutes of Exercise per Session: 0 min  Stress: No Stress Concern Present   Feeling of Stress : Not at all  Social Connections: Not on file  Intimate Partner Violence: Not on file    FAMILY HISTORY: Family History  Problem Relation Age of Onset   Colon cancer Brother 52       exposure to chemicals [place]   Hypertension Mother    Stroke Mother    Kidney failure Father    Breast cancer Neg Hx    Ovarian cancer Neg Hx     ALLERGIES:  is allergic to  sulfamethoxazole-trimethoprim.  MEDICATIONS:  Current Outpatient Medications  Medication Sig Dispense Refill   apixaban (ELIQUIS) 2.5 MG TABS tablet Take 1 tablet (2.5 mg total) by mouth 2 (two) times daily. 60 tablet 2   Cholecalciferol (VITAMIN D3) 2000 units capsule Take 2,000 Units by mouth daily.     diclofenac sodium (VOLTAREN) 1 % GEL Apply 2 g topically 4 (four) times daily as needed (joint pain).  11   diphenoxylate-atropine (LOMOTIL) 2.5-0.025 MG tablet Take 1 tablet by mouth 4 (four) times daily as needed for diarrhea or loose stools. 30 tablet 1   fluticasone (FLONASE) 50 MCG/ACT nasal spray Use 1 spray(s) in each nostril once daily 16 g 1   gabapentin (NEURONTIN) 100 MG capsule Take 1 capsule (100 mg total) by mouth at bedtime. 30 capsule 0   ipratropium (ATROVENT) 0.03 % nasal spray Place 1 spray into both nostrils 2 (two) times daily. 30 mL 2   lidocaine-prilocaine (EMLA) cream Apply 1 application topically as needed. 30 g 6   loperamide (IMODIUM) 2 MG capsule Take 1 capsule (2 mg total) by mouth See admin instructions. With onset of loose stool, take $RemoveBef'4mg'OwsxxPlKLL$  followed by $RemoveBef'2mg'PJYaRGNVMY$  every 2 hours,  Maximum:  16 mg/day 120 capsule 1   loratadine (CLARITIN) 10 MG tablet Take 10 mg by mouth daily.     Multiple Vitamins-Minerals (ONE-A-DAY WOMENS 50 PLUS PO) Take 1 tablet by mouth daily.      ondansetron (ZOFRAN) 8 MG tablet Take 1 tablet (8 mg total) by mouth 2 (two) times daily as needed for refractory nausea / vomiting. Start on day 3 after chemotherapy. 30 tablet 1   prochlorperazine (COMPAZINE) 10 MG tablet Take 1 tablet (10 mg total) by mouth every 6 (six) hours as needed (NAUSEA). 30 tablet 1   simvastatin (ZOCOR) 40 MG tablet Take 1 tablet (40 mg total) by mouth at bedtime. 90 tablet 3   triamterene-hydrochlorothiazide (DYAZIDE) 37.5-25 MG capsule Take 1 each (1 capsule total) by mouth daily. 90 capsule 3   zinc gluconate 50 MG tablet Take 50 mg by mouth daily.     potassium chloride SA  (KLOR-CON) 20 MEQ tablet Take 1 tablet (20 mEq total) by mouth 2 (two) times daily. 120 tablet 1   No current facility-administered medications for this visit.   Facility-Administered Medications Ordered in Other Visits  Medication Dose Route Frequency Provider Last Rate Last Admin   diphenhydrAMINE (BENADRYL) injection 25 mg  25 mg Intravenous Daily PRN Earlie Server, MD   25 mg at 08/16/21 1318   diphenhydrAMINE (BENADRYL) injection 25 mg  25 mg Intravenous Daily PRN Earlie Server, MD   25 mg at 08/16/21 1024   fluorouracil (ADRUCIL) 4,300 mg in sodium chloride 0.9 % 64 mL chemo infusion  2,400 mg/m2 (Order-Specific) Intravenous 1 day or 1 dose Earlie Server, MD   4,300 mg at 08/16/21 1325     PHYSICAL EXAMINATION: ECOG PERFORMANCE STATUS: 1 - Symptomatic but completely ambulatory  Physical Exam Constitutional:      General: She is not in acute distress. HENT:     Head: Normocephalic and atraumatic.  Eyes:     General: No scleral icterus. Cardiovascular:     Rate and Rhythm: Normal rate and regular rhythm.     Heart sounds: Normal heart sounds.  Pulmonary:     Effort: Pulmonary effort is normal. No respiratory distress.     Breath sounds: No wheezing.  Abdominal:     General: Bowel sounds are normal. There is no distension.     Palpations: Abdomen is soft.     Comments: + Colostomy bag   Musculoskeletal:        General: No deformity. Normal range of motion.     Cervical back: Normal range of motion and neck supple.  Skin:    General: Skin is warm and dry.     Findings: No erythema or rash.  Neurological:     Mental Status: She is alert and oriented to person, place, and time. Mental status is at baseline.     Cranial Nerves: No cranial nerve deficit.     Coordination: Coordination normal.      LABORATORY DATA:  I have reviewed the data as listed Lab Results  Component Value Date   WBC 4.9 08/16/2021   HGB 11.5 (L) 08/16/2021   HCT 36.1 08/16/2021   MCV 95.0 08/16/2021   PLT  209 08/16/2021   Recent Labs    07/19/21 0754 08/02/21 0843 08/16/21 0823  NA 135 137 136  K 3.3* 3.3* 3.2*  CL 100 102 101  CO2 $Re'25 27 26  'tja$ GLUCOSE 110* 120* 128*  BUN $Re'9 8 8  'Fov$ CREATININE 1.11* 1.07* 1.15*  CALCIUM 9.4 9.1  9.1  GFRNONAA 52* 55* 50*  PROT 7.4 6.8 6.7  ALBUMIN 4.4 3.9 4.1  AST 36 27 33  ALT 38 16 24  ALKPHOS 96 67 84  BILITOT 0.5 0.4 0.4    Iron/TIBC/Ferritin/ %Sat    Component Value Date/Time   IRON 40 06/23/2020 1124   TIBC 246 (L) 06/23/2020 1124   FERRITIN 338 (H) 06/23/2020 1124   IRONPCTSAT 16 06/23/2020 1124     RADIOGRAPHIC STUDIES: I have personally reviewed the radiological images as listed and agreed with the findings in the report. No results found.    No results found.   ASSESSMENT & PLAN:  1. Rectal cancer ([place])   2. Hypokalemia   3. Encounter for antineoplastic chemotherapy   4. Current use of long term anticoagulation   5. Chemotherapy induced diarrhea   6. History of pulmonary embolism   Cancer Staging Rectal cancer [facility]) Staging form: Colon and Rectum, AJCC 8th Edition - Clinical stage from 01/23/2019: Stage IIIC (cT4b, cN1a, cM0) - Signed by Earlie Server, MD on 01/23/2019 - Pathologic stage from 10/06/2019: Stage IIC (ypT4b, pN0, cM0) - Signed by Earlie Server, MD on 10/06/2019 - Pathologic: Stage Unknown (rpTX, pNX, cM1) - Signed by Earlie Server, MD on 01/31/2021  #History of stage IIIC Rectal cancer, s/p TNT, followed by 09/17/19 APR/posterior vaginectomy/TAH/BSO/VY-flap, pT4b pN0 with close vaginal margin 0.2 mm.  Uterus and ovaries negative for malignancy. palliative radiation to vaginal recurrence.  Finished 09/04/2020 01/19/21 recurrence with lung metastasis.-Palliative  05/18/2021 positive treatment response. CEA has decreased.-290 Labs reviewed and discussed with patient Proceed with FOLFIRI plus bevacizumab today.  Irinotecan further dose reduced to 125 mg/m2 Omit 5-FU bolus. Neulasta on day 3.  Claritin for 4 days. Obtain CT chest  abdomen pelvis with contrast, scheduled.  #Elevated creatinine, encourage oral hydration.  Proceed with IV fluid 1 L of normal saline today.  # Post treatment hives, likely due to chemotherapy. Symptoms are better.  She gets additional one dose of IV benadryl $RemoveBef'25mg'HXiwrPeLeF$  x 1 if needed..  Current  oral benadryl as instructed PRN hive at home.   #Chemotherapy-induced neutropenia, see above.  On GCSF  #Chemotherapy-induced diarrhea, symptoms are stable.  Continue as needed continue Imodium or lomotil as needed..  Dose reduced Irinotecan.,  Omit 5-FU bolus.  #Hypokalemia, potassium 3.2, increase potassium chloride to 40 mEq twice daily.  Refills were sent to pharmacy. #History of pulmonary embolism, history of bilateral DVT Continue Eliquis 2.5 mg twice daily for anticoagulation prophylaxis.   Follow-up 2 weeks for next cycle of treatment.  Earlie Server, MD, PhD

## 2021-08-17 ENCOUNTER — Encounter: Payer: Self-pay | Admitting: Oncology

## 2021-08-17 LAB — CEA: CEA: 215 ng/mL — ABNORMAL HIGH (ref 0.0–4.7)

## 2021-08-18 ENCOUNTER — Other Ambulatory Visit: Payer: Self-pay

## 2021-08-18 ENCOUNTER — Inpatient Hospital Stay: Payer: Medicare Other

## 2021-08-18 VITALS — BP 112/47 | HR 73

## 2021-08-18 DIAGNOSIS — E7800 Pure hypercholesterolemia, unspecified: Secondary | ICD-10-CM | POA: Diagnosis not present

## 2021-08-18 DIAGNOSIS — C799 Secondary malignant neoplasm of unspecified site: Secondary | ICD-10-CM

## 2021-08-18 DIAGNOSIS — Z86718 Personal history of other venous thrombosis and embolism: Secondary | ICD-10-CM | POA: Diagnosis not present

## 2021-08-18 DIAGNOSIS — Z5189 Encounter for other specified aftercare: Secondary | ICD-10-CM | POA: Diagnosis not present

## 2021-08-18 DIAGNOSIS — I700 Atherosclerosis of aorta: Secondary | ICD-10-CM | POA: Diagnosis not present

## 2021-08-18 DIAGNOSIS — I2510 Atherosclerotic heart disease of native coronary artery without angina pectoris: Secondary | ICD-10-CM | POA: Diagnosis not present

## 2021-08-18 DIAGNOSIS — I10 Essential (primary) hypertension: Secondary | ICD-10-CM | POA: Diagnosis not present

## 2021-08-18 DIAGNOSIS — Z800 Family history of malignant neoplasm of digestive organs: Secondary | ICD-10-CM | POA: Diagnosis not present

## 2021-08-18 DIAGNOSIS — Z79899 Other long term (current) drug therapy: Secondary | ICD-10-CM | POA: Diagnosis not present

## 2021-08-18 DIAGNOSIS — C20 Malignant neoplasm of rectum: Secondary | ICD-10-CM | POA: Diagnosis not present

## 2021-08-18 DIAGNOSIS — Z5111 Encounter for antineoplastic chemotherapy: Secondary | ICD-10-CM | POA: Diagnosis not present

## 2021-08-18 DIAGNOSIS — E876 Hypokalemia: Secondary | ICD-10-CM | POA: Diagnosis not present

## 2021-08-18 DIAGNOSIS — Z87891 Personal history of nicotine dependence: Secondary | ICD-10-CM | POA: Diagnosis not present

## 2021-08-18 DIAGNOSIS — Z933 Colostomy status: Secondary | ICD-10-CM | POA: Diagnosis not present

## 2021-08-18 DIAGNOSIS — C7982 Secondary malignant neoplasm of genital organs: Secondary | ICD-10-CM | POA: Diagnosis not present

## 2021-08-18 DIAGNOSIS — K219 Gastro-esophageal reflux disease without esophagitis: Secondary | ICD-10-CM | POA: Diagnosis not present

## 2021-08-18 DIAGNOSIS — Z7901 Long term (current) use of anticoagulants: Secondary | ICD-10-CM | POA: Diagnosis not present

## 2021-08-18 DIAGNOSIS — Z86711 Personal history of pulmonary embolism: Secondary | ICD-10-CM | POA: Diagnosis not present

## 2021-08-18 DIAGNOSIS — R609 Edema, unspecified: Secondary | ICD-10-CM | POA: Diagnosis not present

## 2021-08-18 DIAGNOSIS — K521 Toxic gastroenteritis and colitis: Secondary | ICD-10-CM | POA: Diagnosis not present

## 2021-08-18 MED ORDER — HEPARIN SOD (PORK) LOCK FLUSH 100 UNIT/ML IV SOLN
500.0000 [IU] | Freq: Once | INTRAVENOUS | Status: AC | PRN
  Administered 2021-08-18: 500 [IU]
  Filled 2021-08-18: qty 5

## 2021-08-18 MED ORDER — PEGFILGRASTIM 6 MG/0.6ML ~~LOC~~ PSKT
6.0000 mg | PREFILLED_SYRINGE | Freq: Once | SUBCUTANEOUS | Status: AC
  Administered 2021-08-18: 6 mg via SUBCUTANEOUS
  Filled 2021-08-18: qty 0.6

## 2021-08-18 MED ORDER — HEPARIN SOD (PORK) LOCK FLUSH 100 UNIT/ML IV SOLN
INTRAVENOUS | Status: AC
  Filled 2021-08-18: qty 5

## 2021-08-18 MED ORDER — SODIUM CHLORIDE 0.9% FLUSH
10.0000 mL | INTRAVENOUS | Status: DC | PRN
  Administered 2021-08-18: 10 mL
  Filled 2021-08-18: qty 10

## 2021-08-19 ENCOUNTER — Telehealth: Payer: Self-pay | Admitting: Family Medicine

## 2021-08-19 DIAGNOSIS — Z933 Colostomy status: Secondary | ICD-10-CM | POA: Diagnosis not present

## 2021-08-19 NOTE — Telephone Encounter (Signed)
Okay to give verbal  Nobie Putnam, DO [facility] 08/19/2021, 1:10 PM

## 2021-08-19 NOTE — Telephone Encounter (Signed)
Home Health Verbal Orders - Caller/Agency: [facility] Number: [phone] otp: 2  Requesting OT/PT/Skilled Nursing/Social Work/Speech Therapy: Skilled Nursing  Frequency: 1w1, 2w2, 1w6

## 2021-08-19 NOTE — Telephone Encounter (Signed)
Spoke with [name]. She is aware of proceeding with home health orders.

## 2021-08-22 ENCOUNTER — Encounter: Payer: Self-pay | Admitting: Radiation Oncology

## 2021-08-22 ENCOUNTER — Telehealth: Payer: Self-pay | Admitting: Family Medicine

## 2021-08-22 ENCOUNTER — Ambulatory Visit
Admission: RE | Admit: 2021-08-22 | Discharge: 2021-08-22 | Disposition: A | Discharge status: Home / Self Care | Payer: Medicare Other | Source: Ambulatory Visit | Attending: Radiation Oncology | Admitting: Radiation Oncology

## 2021-08-22 ENCOUNTER — Other Ambulatory Visit: Payer: Self-pay

## 2021-08-22 VITALS — BP 113/72 | HR 88 | Temp 97.8°F | Resp 18 | Wt 153.1 lb

## 2021-08-22 DIAGNOSIS — Z923 Personal history of irradiation: Secondary | ICD-10-CM | POA: Diagnosis not present

## 2021-08-22 DIAGNOSIS — R197 Diarrhea, unspecified: Secondary | ICD-10-CM | POA: Diagnosis not present

## 2021-08-22 DIAGNOSIS — R918 Other nonspecific abnormal finding of lung field: Secondary | ICD-10-CM | POA: Diagnosis not present

## 2021-08-22 DIAGNOSIS — C20 Malignant neoplasm of rectum: Secondary | ICD-10-CM

## 2021-08-22 DIAGNOSIS — Z08 Encounter for follow-up examination after completed treatment for malignant neoplasm: Secondary | ICD-10-CM | POA: Diagnosis not present

## 2021-08-22 NOTE — Telephone Encounter (Signed)
Innocent Dumor , from [facility], calling stating that she has been treating a wound on the pts buttock. She states that the pt is now having smelly discharge from the pts wound. She states that it is a black/ brown color. She is requesting to know if PCP would like to change the wound care. Please advise.      [phone] secure   [phone]

## 2021-08-22 NOTE — Progress Notes (Signed)
Radiation Oncology Follow up Note  Name: Jacqueline Yoder   Date:   08/22/2021 MRN:  [id number] DOB: [birth year]    This 75 y.o. female presents to the clinic today for 1 year follow-up status post adjuvant radiation therapy to her pelvis with patient with stage IIIc adenocarcinoma the rectum (T4b N1 aM0) for recurrent disease involving the vagina.  REFERRING PROVIDER: Nobie Putnam *  HPI: Patient is a 75 year old female now out 1 year having completed salvage radiation therapy for stage IIIc adenocarcinoma the rectum with involvement of the vagina.  She initially had TNT chemotherapy followed by AP results section and had a vaginal recurrence treated with palliative radiation therapy.  She is currently on FOLFIRI and bevacizumab treatment.  She is tolerating it fairly well has persistent diarrhea.  She is taking Imodium and trying to watch what she eats.  She had a recent CT scan back in August showing postoperative findings status post AP resection.  Some appearance of vaginal soft tissue thickening.  Her bilateral inguinal nodes have resolved.  She has also interval decrease in size of multiple small bilateral pulmonary nodules consistent with treatment response.  COMPLICATIONS OF TREATMENT: none  FOLLOW UP COMPLIANCE: keeps appointments   PHYSICAL EXAM:  BP 113/72 (BP Location: Left Arm, Patient Position: Sitting, Cuff Size: Normal)   Pulse 88   Temp 97.8 F (36.6 C) (Tympanic)   Resp 18   Wt 153 lb 1.6 oz (69.4 kg)   SpO2 100%   BMI 25.48 kg/m  Well-developed well-nourished patient in NAD. HEENT reveals PERLA, EOMI, discs not visualized.  Oral cavity is clear. No oral mucosal lesions are identified. Neck is clear without evidence of cervical or supraclavicular adenopathy. Lungs are clear to A&P. Cardiac examination is essentially unremarkable with regular rate and rhythm without murmur rub or thrill. Abdomen is benign with no organomegaly or masses noted. Motor sensory and DTR  levels are equal and symmetric in the upper and lower extremities. Cranial nerves II through XII are grossly intact. Proprioception is intact. No peripheral adenopathy or edema is identified. No motor or sensory levels are noted. Crude visual fields are within normal range.  RADIOLOGY RESULTS: CT scans reviewed compatible with above-stated findings  PLAN: Present time patient is doing fairly well she continues on treatment with medical oncology.  I have given her a low residue diet sheet of also suggest that she takes Imodium on a daily basis.  Otherwise I will see her back in 6 months for follow-up.  Patient knows to call with any concerns.  I would like to take this opportunity to thank you for allowing me to participate in the care of your patient.Noreene Filbert, MD

## 2021-08-23 NOTE — Telephone Encounter (Signed)
She may need an antibiotic course. If family is interested I can offer an antibiotic at this time and continue the home health wound care.  Other option for wound care - would be referral to [facility].  Nobie Putnam, DO [facility] 08/23/2021, 11:22 AM

## 2021-08-24 ENCOUNTER — Encounter: Payer: Self-pay | Admitting: Family Medicine

## 2021-08-24 DIAGNOSIS — L89312 Pressure ulcer of right buttock, stage 2: Secondary | ICD-10-CM

## 2021-08-25 MED ORDER — AMOXICILLIN-POT CLAVULANATE 875-125 MG PO TABS: 1.0000 | ORAL_TABLET | Freq: Two times a day (BID) | ORAL | 0 refills | Status: DC

## 2021-08-30 ENCOUNTER — Encounter: Payer: Self-pay | Admitting: Oncology

## 2021-08-30 ENCOUNTER — Inpatient Hospital Stay: Payer: Medicare Other

## 2021-08-30 ENCOUNTER — Other Ambulatory Visit: Payer: Self-pay

## 2021-08-30 ENCOUNTER — Inpatient Hospital Stay (HOSPITAL_BASED_OUTPATIENT_CLINIC_OR_DEPARTMENT_OTHER): Payer: Medicare Other | Admitting: Oncology

## 2021-08-30 VITALS — BP 135/81 | HR 84 | Temp 98.2°F | Wt 154.8 lb

## 2021-08-30 DIAGNOSIS — K219 Gastro-esophageal reflux disease without esophagitis: Secondary | ICD-10-CM | POA: Diagnosis not present

## 2021-08-30 DIAGNOSIS — Z79899 Other long term (current) drug therapy: Secondary | ICD-10-CM | POA: Diagnosis not present

## 2021-08-30 DIAGNOSIS — Z86718 Personal history of other venous thrombosis and embolism: Secondary | ICD-10-CM | POA: Diagnosis not present

## 2021-08-30 DIAGNOSIS — Z5111 Encounter for antineoplastic chemotherapy: Secondary | ICD-10-CM

## 2021-08-30 DIAGNOSIS — T451X5A Adverse effect of antineoplastic and immunosuppressive drugs, initial encounter: Secondary | ICD-10-CM | POA: Diagnosis not present

## 2021-08-30 DIAGNOSIS — C799 Secondary malignant neoplasm of unspecified site: Secondary | ICD-10-CM

## 2021-08-30 DIAGNOSIS — E876 Hypokalemia: Secondary | ICD-10-CM

## 2021-08-30 DIAGNOSIS — C20 Malignant neoplasm of rectum: Secondary | ICD-10-CM | POA: Diagnosis not present

## 2021-08-30 DIAGNOSIS — K521 Toxic gastroenteritis and colitis: Secondary | ICD-10-CM

## 2021-08-30 DIAGNOSIS — Z86711 Personal history of pulmonary embolism: Secondary | ICD-10-CM

## 2021-08-30 DIAGNOSIS — Z7901 Long term (current) use of anticoagulants: Secondary | ICD-10-CM

## 2021-08-30 DIAGNOSIS — E7800 Pure hypercholesterolemia, unspecified: Secondary | ICD-10-CM | POA: Diagnosis not present

## 2021-08-30 DIAGNOSIS — I700 Atherosclerosis of aorta: Secondary | ICD-10-CM | POA: Diagnosis not present

## 2021-08-30 DIAGNOSIS — Z800 Family history of malignant neoplasm of digestive organs: Secondary | ICD-10-CM | POA: Diagnosis not present

## 2021-08-30 DIAGNOSIS — I2510 Atherosclerotic heart disease of native coronary artery without angina pectoris: Secondary | ICD-10-CM | POA: Diagnosis not present

## 2021-08-30 DIAGNOSIS — Z5189 Encounter for other specified aftercare: Secondary | ICD-10-CM | POA: Diagnosis not present

## 2021-08-30 DIAGNOSIS — Z87891 Personal history of nicotine dependence: Secondary | ICD-10-CM | POA: Diagnosis not present

## 2021-08-30 DIAGNOSIS — Z933 Colostomy status: Secondary | ICD-10-CM | POA: Diagnosis not present

## 2021-08-30 DIAGNOSIS — C7982 Secondary malignant neoplasm of genital organs: Secondary | ICD-10-CM | POA: Diagnosis not present

## 2021-08-30 DIAGNOSIS — R609 Edema, unspecified: Secondary | ICD-10-CM | POA: Diagnosis not present

## 2021-08-30 DIAGNOSIS — I10 Essential (primary) hypertension: Secondary | ICD-10-CM | POA: Diagnosis not present

## 2021-08-30 LAB — COMPREHENSIVE METABOLIC PANEL
ALT: 36 U/L (ref 0–44)
AST: 29 U/L (ref 15–41)
Albumin: 4 g/dL (ref 3.5–5.0)
Alkaline Phosphatase: 110 U/L (ref 38–126)
Anion gap: 11 (ref 5–15)
BUN: 10 mg/dL (ref 8–23)
CO2: 22 mmol/L (ref 22–32)
Calcium: 9.3 mg/dL (ref 8.9–10.3)
Chloride: 100 mmol/L (ref 98–111)
Creatinine, Ser: 1.03 mg/dL — ABNORMAL HIGH (ref 0.44–1.00)
GFR, Estimated: 57 mL/min — ABNORMAL LOW (ref >60)
Glucose, Bld: 115 mg/dL — ABNORMAL HIGH (ref 70–99)
Potassium: 3.6 mmol/L (ref 3.5–5.1)
Sodium: 133 mmol/L — ABNORMAL LOW (ref 135–145)
Total Bilirubin: 0.3 mg/dL (ref 0.3–1.2)
Total Protein: 7.1 g/dL (ref 6.5–8.1)

## 2021-08-30 LAB — CBC WITH DIFFERENTIAL/PLATELET
Abs Immature Granulocytes: 0.08 10*3/uL — ABNORMAL HIGH (ref 0.00–0.07)
Basophils Absolute: 0 10*3/uL (ref 0.0–0.1)
Basophils Relative: 0 %
Eosinophils Absolute: 0.1 10*3/uL (ref 0.0–0.5)
Eosinophils Relative: 2 %
HCT: 35.7 % — ABNORMAL LOW (ref 36.0–46.0)
Hemoglobin: 11.4 g/dL — ABNORMAL LOW (ref 12.0–15.0)
Immature Granulocytes: 1 %
Lymphocytes Relative: 20 %
Lymphs Abs: 1.2 10*3/uL (ref 0.7–4.0)
MCH: 30.9 pg (ref 26.0–34.0)
MCHC: 31.9 g/dL (ref 30.0–36.0)
MCV: 96.7 fL (ref 80.0–100.0)
Monocytes Absolute: 0.5 10*3/uL (ref 0.1–1.0)
Monocytes Relative: 9 %
Neutro Abs: 4.1 10*3/uL (ref 1.7–7.7)
Neutrophils Relative %: 68 %
Platelets: 231 10*3/uL (ref 150–400)
RBC: 3.69 MIL/uL — ABNORMAL LOW (ref 3.87–5.11)
RDW: 18.4 % — ABNORMAL HIGH (ref 11.5–15.5)
WBC: 6 10*3/uL (ref 4.0–10.5)
nRBC: 0 % (ref 0.0–0.2)

## 2021-08-30 LAB — PROTEIN, URINE, RANDOM: Total Protein, Urine: 28 mg/dL

## 2021-08-30 MED ORDER — SODIUM CHLORIDE 0.9 % IV SOLN
100.0000 mg/m2 | Freq: Once | INTRAVENOUS | Status: AC
  Administered 2021-08-30: 180 mg via INTRAVENOUS
  Filled 2021-08-30: qty 5

## 2021-08-30 MED ORDER — SODIUM CHLORIDE 0.9 % IV SOLN
10.0000 mg | Freq: Once | INTRAVENOUS | Status: AC
  Administered 2021-08-30: 10 mg via INTRAVENOUS
  Filled 2021-08-30: qty 10

## 2021-08-30 MED ORDER — ATROPINE SULFATE 1 MG/ML IV SOLN
0.5000 mg | Freq: Once | INTRAVENOUS | Status: AC
  Administered 2021-08-30: 0.5 mg via INTRAVENOUS
  Filled 2021-08-30: qty 1

## 2021-08-30 MED ORDER — DIPHENHYDRAMINE HCL 50 MG/ML IJ SOLN
25.0000 mg | Freq: Every day | INTRAMUSCULAR | Status: DC | PRN
  Administered 2021-08-30: 25 mg via INTRAVENOUS

## 2021-08-30 MED ORDER — SODIUM CHLORIDE 0.9 % IV SOLN
350.0000 mg | Freq: Once | INTRAVENOUS | Status: AC
  Administered 2021-08-30: 350 mg via INTRAVENOUS
  Filled 2021-08-30: qty 14

## 2021-08-30 MED ORDER — SODIUM CHLORIDE 0.9 % IV SOLN
Freq: Once | INTRAVENOUS | Status: AC
  Filled 2021-08-30: qty 250

## 2021-08-30 MED ORDER — PALONOSETRON HCL INJECTION 0.25 MG/5ML
0.2500 mg | Freq: Once | INTRAVENOUS | Status: AC
  Administered 2021-08-30: 0.25 mg via INTRAVENOUS
  Filled 2021-08-30: qty 5

## 2021-08-30 MED ORDER — DIPHENHYDRAMINE HCL 50 MG/ML IJ SOLN
25.0000 mg | Freq: Every day | INTRAMUSCULAR | Status: DC | PRN
  Administered 2021-08-30: 25 mg via INTRAVENOUS
  Filled 2021-08-30 (×2): qty 1

## 2021-08-30 MED ORDER — MONTELUKAST SODIUM 10 MG PO TABS
10.0000 mg | ORAL_TABLET | Freq: Once | ORAL | Status: AC
  Administered 2021-08-30: 10 mg via ORAL
  Filled 2021-08-30: qty 1

## 2021-08-30 MED ORDER — SODIUM CHLORIDE 0.9 % IV SOLN
750.0000 mg | Freq: Once | INTRAVENOUS | Status: AC
  Administered 2021-08-30: 750 mg via INTRAVENOUS
  Filled 2021-08-30: qty 37.5

## 2021-08-30 MED ORDER — SODIUM CHLORIDE 0.9 % IV SOLN
2400.0000 mg/m2 | INTRAVENOUS | Status: DC
  Administered 2021-08-30: 4300 mg via INTRAVENOUS
  Filled 2021-08-30: qty 86

## 2021-08-30 NOTE — Patient Instructions (Signed)
[facility]  Discharge Instructions: Thank you for choosing [facility] to provide your oncology and hematology care.  If you have a lab appointment with the [facility], please go directly to the [facility] and check in at the registration area.  Wear comfortable clothing and clothing appropriate for easy access to any Portacath or PICC line.   We strive to give you quality time with your provider. You may need to reschedule your appointment if you arrive late (15 or more minutes).  Arriving late affects you and other patients whose appointments are after yours.  Also, if you miss three or more appointments without notifying the office, you may be dismissed from the clinic at the provider's discretion.      For prescription refill requests, have your pharmacy contact our office and allow 72 hours for refills to be completed.    Today you received the following chemotherapy and/or immunotherapy agents MVASI, IRINOTECAN, 5 FU      To help prevent nausea and vomiting after your treatment, we encourage you to take your nausea medication as directed.  BELOW ARE SYMPTOMS THAT SHOULD BE REPORTED IMMEDIATELY: *FEVER GREATER THAN 100.4 F (38 C) OR HIGHER *CHILLS OR SWEATING *NAUSEA AND VOMITING THAT IS NOT CONTROLLED WITH YOUR NAUSEA MEDICATION *UNUSUAL SHORTNESS OF BREATH *UNUSUAL BRUISING OR BLEEDING *URINARY PROBLEMS (pain or burning when urinating, or frequent urination) *BOWEL PROBLEMS (unusual diarrhea, constipation, pain near the anus) TENDERNESS IN MOUTH AND THROAT WITH OR WITHOUT PRESENCE OF ULCERS (sore throat, sores in mouth, or a toothache) UNUSUAL RASH, SWELLING OR PAIN  UNUSUAL VAGINAL DISCHARGE OR ITCHING   Items with * indicate a potential emergency and should be followed up as soon as possible or go to the Emergency Department if any problems should occur.  Please show the CHEMOTHERAPY ALERT CARD or IMMUNOTHERAPY ALERT CARD  at check-in to the Emergency Department and triage nurse.  Should you have questions after your visit or need to cancel or reschedule your appointment, please contact [facility]  [phone] and follow the prompts.  Office hours are 8:00 a.m. to 4:30 p.m. Monday - Friday. Please note that voicemails left after 4:00 p.m. may not be returned until the following business day.  We are closed weekends and major holidays. You have access to a nurse at all times for urgent questions. Please call the main number to the clinic [phone] and follow the prompts.  For any non-urgent questions, you may also contact your provider using MyChart. We now offer e-Visits for anyone 34 and older to request care online for non-urgent symptoms. For details visit [email or web address].   Also download the MyChart app! Go to the app store, search "MyChart", open the app, select [facility], and log in with your MyChart username and password.  Due to Covid, a mask is required upon entering the hospital/clinic. If you do not have a mask, one will be given to you upon arrival. For doctor visits, patients may have 1 support person aged 35 or older with them. For treatment visits, patients cannot have anyone with them due to current Covid guidelines and our immunocompromised population.   Bevacizumab injection What is this medication? BEVACIZUMAB (be va SIZ yoo mab) is a monoclonal antibody. It is used to treat many types of cancer. This medicine may be used for other purposes; ask your health care provider or pharmacist if you have questions. COMMON BRAND NAME(S): Alymsys, Avastin, MVASI, Zirabev What  should I tell my care team before I take this medication? They need to know if you have any of these conditions: diabetes heart disease high blood pressure history of coughing up blood prior anthracycline chemotherapy (e.g., doxorubicin, daunorubicin, epirubicin) recent or ongoing  radiation therapy recent or planning to have surgery stroke an unusual or allergic reaction to bevacizumab, hamster proteins, mouse proteins, other medicines, foods, dyes, or preservatives pregnant or trying to get pregnant breast-feeding How should I use this medication? This medicine is for infusion into a vein. It is given by a health care professional in a hospital or clinic setting. Talk to your pediatrician regarding the use of this medicine in children. Special care may be needed. Overdosage: If you think you have taken too much of this medicine contact a poison control center or emergency room at once. NOTE: This medicine is only for you. Do not share this medicine with others. What if I miss a dose? It is important not to miss your dose. Call your doctor or health care professional if you are unable to keep an appointment. What may interact with this medication? Interactions are not expected. This list may not describe all possible interactions. Give your health care provider a list of all the medicines, herbs, non-prescription drugs, or dietary supplements you use. Also tell them if you smoke, drink alcohol, or use illegal drugs. Some items may interact with your medicine. What should I watch for while using this medication? Your condition will be monitored carefully while you are receiving this medicine. You will need important blood work and urine testing done while you are taking this medicine. This medicine may increase your risk to bruise or bleed. Call your doctor or health care professional if you notice any unusual bleeding. Before having surgery, talk to your health care provider to make sure it is ok. This drug can increase the risk of poor healing of your surgical site or wound. You will need to stop this drug for 28 days before surgery. After surgery, wait at least 28 days before restarting this drug. Make sure the surgical site or wound is healed enough before restarting  this drug. Talk to your health care provider if questions. Do not become pregnant while taking this medicine or for 6 months after stopping it. Women should inform their doctor if they wish to become pregnant or think they might be pregnant. There is a potential for serious side effects to an unborn child. Talk to your health care professional or pharmacist for more information. Do not breast-feed an infant while taking this medicine and for 6 months after the last dose. This medicine has caused ovarian failure in some women. This medicine may interfere with the ability to have a child. You should talk to your doctor or health care professional if you are concerned about your fertility. What side effects may I notice from receiving this medication? Side effects that you should report to your doctor or health care professional as soon as possible: allergic reactions like skin rash, itching or hives, swelling of the face, lips, or tongue chest pain or chest tightness chills coughing up blood high fever seizures severe constipation signs and symptoms of bleeding such as bloody or black, tarry stools; red or dark-brown urine; spitting up blood or brown material that looks like coffee grounds; red spots on the skin; unusual bruising or bleeding from the eye, gums, or nose signs and symptoms of a blood clot such as breathing problems; chest pain; severe,  sudden headache; pain, swelling, warmth in the leg signs and symptoms of a stroke like changes in vision; confusion; trouble speaking or understanding; severe headaches; sudden numbness or weakness of the face, arm or leg; trouble walking; dizziness; loss of balance or coordination stomach pain sweating swelling of legs or ankles vomiting weight gain Side effects that usually do not require medical attention (report to your doctor or health care professional if they continue or are bothersome): back pain changes in taste decreased appetite dry  skin nausea tiredness This list may not describe all possible side effects. Call your doctor for medical advice about side effects. You may report side effects to FDA at 1-800-FDA-1088. Where should I keep my medication? This drug is given in a hospital or clinic and will not be stored at home. NOTE: This sheet is a summary. It may not cover all possible information. If you have questions about this medicine, talk to your doctor, pharmacist, or health care provider.  2022 Elsevier/Gold Standard (2021-06-21 00:00:00)   Irinotecan injection What is this medication? IRINOTECAN (ir in oh TEE kan ) is a chemotherapy drug. It is used to treat colon and rectal cancer. This medicine may be used for other purposes; ask your health care provider or pharmacist if you have questions. COMMON BRAND NAME(S): Camptosar What should I tell my care team before I take this medication? They need to know if you have any of these conditions: dehydration diarrhea infection (especially a virus infection such as chickenpox, cold sores, or herpes) liver disease low blood counts, like low white cell, platelet, or red cell counts low levels of calcium, magnesium, or potassium in the blood recent or ongoing radiation therapy an unusual or allergic reaction to irinotecan, other medicines, foods, dyes, or preservatives pregnant or trying to get pregnant breast-feeding How should I use this medication? This drug is given as an infusion into a vein. It is administered in a hospital or clinic by a specially trained health care professional. Talk to your pediatrician regarding the use of this medicine in children. Special care may be needed. Overdosage: If you think you have taken too much of this medicine contact a poison control center or emergency room at once. NOTE: This medicine is only for you. Do not share this medicine with others. What if I miss a dose? It is important not to miss your dose. Call your doctor  or health care professional if you are unable to keep an appointment. What may interact with this medication? Do not take this medicine with any of the following medications: cobicistat itraconazole This medicine may interact with the following medications: antiviral medicines for HIV or AIDS certain antibiotics like rifampin or rifabutin certain medicines for fungal infections like ketoconazole, posaconazole, and voriconazole certain medicines for seizures like carbamazepine, phenobarbital, phenotoin clarithromycin gemfibrozil nefazodone [facility]. John's Wort This list may not describe all possible interactions. Give your health care provider a list of all the medicines, herbs, non-prescription drugs, or dietary supplements you use. Also tell them if you smoke, drink alcohol, or use illegal drugs. Some items may interact with your medicine. What should I watch for while using this medication? Your condition will be monitored carefully while you are receiving this medicine. You will need important blood work done while you are taking this medicine. This drug may make you feel generally unwell. This is not uncommon, as chemotherapy can affect healthy cells as well as cancer cells. Report any side effects. Continue your course of treatment even  though you feel ill unless your doctor tells you to stop. In some cases, you may be given additional medicines to help with side effects. Follow all directions for their use. You may get drowsy or dizzy. Do not drive, use machinery, or do anything that needs mental alertness until you know how this medicine affects you. Do not stand or sit up quickly, especially if you are an older patient. This reduces the risk of dizzy or fainting spells. Call your health care professional for advice if you get a fever, chills, or sore throat, or other symptoms of a cold or flu. Do not treat yourself. This medicine decreases your body's ability to fight infections. Try to avoid  being around people who are sick. Avoid taking products that contain aspirin, acetaminophen, ibuprofen, naproxen, or ketoprofen unless instructed by your doctor. These medicines may hide a fever. This medicine may increase your risk to bruise or bleed. Call your doctor or health care professional if you notice any unusual bleeding. Be careful brushing and flossing your teeth or using a toothpick because you may get an infection or bleed more easily. If you have any dental work done, tell your dentist you are receiving this medicine. Do not become pregnant while taking this medicine or for 6 months after stopping it. Women should inform their health care professional if they wish to become pregnant or think they might be pregnant. Men should not father a child while taking this medicine and for 3 months after stopping it. There is potential for serious side effects to an unborn child. Talk to your health care professional for more information. Do not breast-feed an infant while taking this medicine or for 7 days after stopping it. This medicine has caused ovarian failure in some women. This medicine may make it more difficult to get pregnant. Talk to your health care professional if you are concerned about your fertility. This medicine has caused decreased sperm counts in some men. This may make it more difficult to father a child. Talk to your health care professional if you are concerned about your fertility. What side effects may I notice from receiving this medication? Side effects that you should report to your doctor or health care professional as soon as possible: allergic reactions like skin rash, itching or hives, swelling of the face, lips, or tongue chest pain diarrhea flushing, runny nose, sweating during infusion low blood counts - this medicine may decrease the number of white blood cells, red blood cells and platelets. You may be at increased risk for infections and bleeding. nausea,  vomiting pain, swelling, warmth in the leg signs of decreased platelets or bleeding - bruising, pinpoint red spots on the skin, black, tarry stools, blood in the urine signs of infection - fever or chills, cough, sore throat, pain or difficulty passing urine signs of decreased red blood cells - unusually weak or tired, fainting spells, lightheadedness Side effects that usually do not require medical attention (report to your doctor or health care professional if they continue or are bothersome): constipation hair loss headache loss of appetite mouth sores stomach pain This list may not describe all possible side effects. Call your doctor for medical advice about side effects. You may report side effects to FDA at 1-800-FDA-1088. Where should I keep my medication? This drug is given in a hospital or clinic and will not be stored at home. NOTE: This sheet is a summary. It may not cover all possible information. If you have questions about  this medicine, talk to your doctor, pharmacist, or health care provider.  2022 Elsevier/Gold Standard (2021-06-21 00:00:00)  Fluorouracil, 5-FU injection What is this medication? FLUOROURACIL, 5-FU (flure oh YOOR a sil) is a chemotherapy drug. It slows the growth of cancer cells. This medicine is used to treat many types of cancer like breast cancer, colon or rectal cancer, pancreatic cancer, and stomach cancer. This medicine may be used for other purposes; ask your health care provider or pharmacist if you have questions. COMMON BRAND NAME(S): Adrucil What should I tell my care team before I take this medication? They need to know if you have any of these conditions: blood disorders dihydropyrimidine dehydrogenase (DPD) deficiency infection (especially a virus infection such as chickenpox, cold sores, or herpes) kidney disease liver disease malnourished, poor nutrition recent or ongoing radiation therapy an unusual or allergic reaction to  fluorouracil, other chemotherapy, other medicines, foods, dyes, or preservatives pregnant or trying to get pregnant breast-feeding How should I use this medication? This drug is given as an infusion or injection into a vein. It is administered in a hospital or clinic by a specially trained health care professional. Talk to your pediatrician regarding the use of this medicine in children. Special care may be needed. Overdosage: If you think you have taken too much of this medicine contact a poison control center or emergency room at once. NOTE: This medicine is only for you. Do not share this medicine with others. What if I miss a dose? It is important not to miss your dose. Call your doctor or health care professional if you are unable to keep an appointment. What may interact with this medication? Do not take this medicine with any of the following medications: live virus vaccines This medicine may also interact with the following medications: medicines that treat or prevent blood clots like warfarin, enoxaparin, and dalteparin This list may not describe all possible interactions. Give your health care provider a list of all the medicines, herbs, non-prescription drugs, or dietary supplements you use. Also tell them if you smoke, drink alcohol, or use illegal drugs. Some items may interact with your medicine. What should I watch for while using this medication? Visit your doctor for checks on your progress. This drug may make you feel generally unwell. This is not uncommon, as chemotherapy can affect healthy cells as well as cancer cells. Report any side effects. Continue your course of treatment even though you feel ill unless your doctor tells you to stop. In some cases, you may be given additional medicines to help with side effects. Follow all directions for their use. Call your doctor or health care professional for advice if you get a fever, chills or sore throat, or other symptoms of a  cold or flu. Do not treat yourself. This drug decreases your body's ability to fight infections. Try to avoid being around people who are sick. This medicine may increase your risk to bruise or bleed. Call your doctor or health care professional if you notice any unusual bleeding. Be careful brushing and flossing your teeth or using a toothpick because you may get an infection or bleed more easily. If you have any dental work done, tell your dentist you are receiving this medicine. Avoid taking products that contain aspirin, acetaminophen, ibuprofen, naproxen, or ketoprofen unless instructed by your doctor. These medicines may hide a fever. Do not become pregnant while taking this medicine. Women should inform their doctor if they wish to become pregnant or think they might  be pregnant. There is a potential for serious side effects to an unborn child. Talk to your health care professional or pharmacist for more information. Do not breast-feed an infant while taking this medicine. Men should inform their doctor if they wish to father a child. This medicine may lower sperm counts. Do not treat diarrhea with over the counter products. Contact your doctor if you have diarrhea that lasts more than 2 days or if it is severe and watery. This medicine can make you more sensitive to the sun. Keep out of the sun. If you cannot avoid being in the sun, wear protective clothing and use sunscreen. Do not use sun lamps or tanning beds/booths. What side effects may I notice from receiving this medication? Side effects that you should report to your doctor or health care professional as soon as possible: allergic reactions like skin rash, itching or hives, swelling of the face, lips, or tongue low blood counts - this medicine may decrease the number of white blood cells, red blood cells and platelets. You may be at increased risk for infections and bleeding. signs of infection - fever or chills, cough, sore throat, pain  or difficulty passing urine signs of decreased platelets or bleeding - bruising, pinpoint red spots on the skin, black, tarry stools, blood in the urine signs of decreased red blood cells - unusually weak or tired, fainting spells, lightheadedness breathing problems changes in vision chest pain mouth sores nausea and vomiting pain, swelling, redness at site where injected pain, tingling, numbness in the hands or feet redness, swelling, or sores on hands or feet stomach pain unusual bleeding Side effects that usually do not require medical attention (report to your doctor or health care professional if they continue or are bothersome): changes in finger or toe nails diarrhea dry or itchy skin hair loss headache loss of appetite sensitivity of eyes to the light stomach upset unusually teary eyes This list may not describe all possible side effects. Call your doctor for medical advice about side effects. You may report side effects to FDA at 1-800-FDA-1088. Where should I keep my medication? This drug is given in a hospital or clinic and will not be stored at home. NOTE: This sheet is a summary. It may not cover all possible information. If you have questions about this medicine, talk to your doctor, pharmacist, or health care provider.  2022 Elsevier/Gold Standard (2021-06-21 00:00:00)

## 2021-08-30 NOTE — Progress Notes (Signed)
Hematology/Oncology progress note [phone] [phone]   Patient Care Team: Olin Hauser, DO as PCP - General (Family Medicine) Clent Jacks, RN as Registered Nurse Earlie Server, MD as Consulting Physician (Hematology and Oncology)  CHIEF COMPLAINTS/REASON FOR VISIT:  Follow up for rectal cancer  HISTORY OF PRESENTING ILLNESS:  Patient initially presented with complaints of postmenopausal bleeding on 08/16/2018.  History of was menopausal vaginal bleeding in 2016 which resulted in cervical polypectomy.  Pathology 02/04/2015 showed cervical polyp, consistent with benign endometrial polyp.  [name] after polypectomy due to anxiety associated with pelvic exams.  pelvic exam on 08/16/2018 reviewed cervical abnormality and from enlarged uterus. Seen by Dr. Marcelline Mates on 10/29/2018.  Endometrial biopsy and a Pap smear was performed. 10/29/2018 Pap smear showed adenocarcinoma, favor endometrial origin. 10/29/2018 endometrial biopsy showed endometrioid carcinoma, FIGO grade 1.  10/29/2018- TA & TV Ultrasound revealed: Anteverted uterus measuring 8.7 x 5.6 x 6.4 cm without evidence of focal masses.  The endometrium measuring 24.1 mm (thickened) and heterogeneous.  Right and left ovaries not visualized.  No adnexal masses identified.  No free fluid in cul-de-sac.  Patient was seen by Dr. Theora Gianotti in clinic on 11/13/2018.  Cervical exam reveals 2 cm exophytic irregular mass consistent with malignancy.   11/19/2018 CT chest abdomen pelvis with contrast showed thickened endometrium with some irregularity compatible with the provided diagnosis of endometrial malignancy.  There is a mildly prominent left inguinal node 1.4 cm.  Patient was seen by Dr. Fransisca Connors on 11/20/2018 and left groin lymph node biopsy was recommended.  11/26/2018 patient underwent left inguinal lymph node biopsy. Pathology showed metastatic adenocarcinoma consistent with colorectal origin.  CDX  2+.  Case was discussed on tumor board.  Recommend colonoscopy for further evaluation.  Patient reports significant weight loss 30 pounds over the last year.  Chronic vaginal spotting. Change of bowel habits the past few months.  More constipated.  Family history positive for brother who has colon cancer prostate cancer.  patient has underwent colonoscopy on 12/03/2018 which reviewed a nonobstructing large mass in the rectum.  Also chronic fistula.  Mass was not circumferential.  This was biopsied with a cold forceps for histology.  Pathology came back hyperplastic polyp negative for dysplasia and malignancy. [name], patient underwent flex sigmoidoscopy on 12/06/2018 with rebiopsy of the rectal mass. This time biopsy results came back positive for invasive colorectal adenocarcinoma, moderately differentiated. Immunotherapy for nearly mismatch repair protein (MMR ) was performed.  There is no loss of MMR expression.  low probability of MSI high.   # Seen by [name] for evaluation of resectability for rectal cancer. In addition, she also had a second opinion with [facility] where her endometrial biopsy pathology was changed to  adenocarcinoma, consistent with colorectal primary.   [name]. She has home health that has been assisting with ostomy care  Patient was also evaluated by [facility] oncology.  Recommendation is to proceed with TNT with concurrent chemoradiation followed by neoadjuvant chemotherapy followed by surgical resection. Patient prefers to have treatment done locally with [facility].   # Oncology Treatment:  02/03/2019- 03/19/2019  concurrent Xeloda and radiation.  Xeloda dose 823m /m2 BID - rounded to 16584mBID- on days of radiation. 04/09/2019, started on FOLFOX with bolus early.  Omitted.  07/16/2019 finished 8 cycles of FOLFOX. 09/17/19 APR/posterior vaginectomy/TAH/BSO/VY-flap pT4b pN0 with close vaginal margin 0.2  mm.  Uterus and ovaries negative for malignancy. Patient  reports bilateral lower extremity numbness and tingling, intermittent, left worse than right. She has lost a lot of weight since her APR surgery.   #Family history with half brother having's history of colon cancer prostate cancer.  Personal history of colorectal cancer.  Patient has not decided if she wants genetic testing.    # history of PE( 01/13/2020)  in the bilateral lower extremity DVT (01/13/2020).   She finishes 6 months of anticoagulation with Eliquis 5 mg twice daily. Now switched to Eliquis 2.5 mg twice daily..  # She has now developed recurrent disease. #06/30/20  vaginal introitus mass biopsied. Pathology is consistent with metastatic colorectal adenocarcinoma I have discussed with [name]  Dr. Luciano Cutter and the mass is not resectable. Patient has also had colonoscopy by Dr. Tobi Bastos yesterday. Normal examination. # 07/16/2020 cycle 1 FOLFIRI  # 07/20/2020 PET scan was done for further evaluation, images are consistent with local recurrence, no distant metastasis. #Discussed with radiation oncology Dr. Rushie Chestnut will recommends concurrent chemotherapy and radiation. 08/02/2020-08/16/2020, patient starts radiation.  Xeloda was held due to neutropenia 08/17/2020,-09/06/2020 Xeloda 1500 mg twice daily concurrently with radiation  01/31/21 started on FOLFIRI + Bev 05/18/2021 CT chest abdomen pelvis showed Previously noted enlargement of bilateral inguinal lymph nodes is resolved, consistent with treatment response of nodal metastatic disease. Interval decrease in size of multiple small bilateral pulmonary nodules, consistent with treatment response of pulmonary metastatic disease. No evidence of new metastatic disease.  INTERVAL HISTORY Jacqueline Yoder is a 75 y.o. female who has above history reviewed by me presents for follow-up of rectal cancer. She tolerates chemotherapy well, patient had diarrhea episode last week, the past few days,  diarrhea has improved.  Patient takes Imodium as needed.  Today she has no new complaints.  Review of Systems  Constitutional:  Positive for fatigue. Negative for appetite change, chills, fever and unexpected weight change.  HENT:   Negative for hearing loss and voice change.   Eyes:  Negative for eye problems.  Respiratory:  Negative for chest tightness and cough.   Cardiovascular:  Negative for chest pain.  Gastrointestinal:  Positive for diarrhea. Negative for abdominal distention, abdominal pain, blood in stool, constipation and nausea.  Endocrine: Negative for hot flashes.  Genitourinary:  Negative for difficulty urinating and frequency.   Musculoskeletal:  Negative for arthralgias.  Skin:  Negative for itching and rash.  Neurological:  Negative for extremity weakness and numbness.  Hematological:  Negative for adenopathy.  Psychiatric/Behavioral:  Negative for confusion.    MEDICAL HISTORY:  Past Medical History:  Diagnosis Date   Allergy    Arthritis    Blood clot in vein    Family history of colon cancer    GERD (gastroesophageal reflux disease)    Hypercholesteremia    Hypertension    Hypertension    Lower extremity edema    Rectal cancer (HCC) 12/2018   Urinary incontinence     SURGICAL HISTORY: Past Surgical History:  Procedure Laterality Date   ABDOMINAL HYSTERECTOMY     CHOLECYSTECTOMY  1971   COLONOSCOPY WITH PROPOFOL N/A 12/03/2018   Procedure: COLONOSCOPY WITH PROPOFOL;  Surgeon: Midge Minium, MD;  Location: [facility] ENDOSCOPY;  Service: Endoscopy;  Laterality: N/A;   COLONOSCOPY WITH PROPOFOL N/A 07/15/2020   Procedure: COLONOSCOPY WITH PROPOFOL;  Surgeon: Wyline Mood, MD;  Location: [facility] ENDOSCOPY;  Service: Gastroenterology;  Laterality: N/A;   FLEXIBLE SIGMOIDOSCOPY N/A 12/06/2018   Procedure: FLEXIBLE SIGMOIDOSCOPY;  Surgeon: Wyline Mood, MD;  Location: [facility] ENDOSCOPY;  Service: Endoscopy;  Laterality: N/A;   LAPAROSCOPIC COLOSTOMY  01/06/2019   PORTACATH  PLACEMENT N/A 04/03/2019   Procedure: INSERTION PORT-A-CATH;  Surgeon: Jules Husbands, MD;  Location: [facility] ORS;  Service: General;  Laterality: N/A;    SOCIAL HISTORY: Social History   Socioeconomic History   Marital status: Widowed    Spouse name: Not on file   Number of children: Not on file   Years of education: Not on file   Highest education level: Not on file  Occupational History   Not on file  Tobacco Use   Smoking status: Former    Types: Cigarettes    Quit date: 12/02/1977    Years since quitting: 43.7   Smokeless tobacco: Former  Scientific laboratory technician Use: Never used  Substance and Sexual Activity   Alcohol use: Never   Drug use: Never   Sexual activity: Not Currently    Birth control/protection: None  Other Topics Concern   Not on file  Social History Narrative   Lives with daughter   Social Determinants of Health   Financial Resource Strain: Low Risk    Difficulty of Paying Living Expenses: Not hard at all  Food Insecurity: No Food Insecurity   Worried About Charity fundraiser in the Last Year: Never true   [place] in the Last Year: Never true  Transportation Needs: No Transportation Needs   Lack of Transportation (Medical): No   Lack of Transportation (Non-Medical): No  Physical Activity: Inactive   Days of Exercise per Week: 0 days   Minutes of Exercise per Session: 0 min  Stress: No Stress Concern Present   Feeling of Stress : Not at all  Social Connections: Not on file  Intimate Partner Violence: Not on file    FAMILY HISTORY: Family History  Problem Relation Age of Onset   Colon cancer Brother 47       exposure to chemicals [place]   Hypertension Mother    Stroke Mother    Kidney failure Father    Breast cancer Neg Hx    Ovarian cancer Neg Hx     ALLERGIES:  is allergic to sulfamethoxazole-trimethoprim.  MEDICATIONS:  Current Outpatient Medications  Medication Sig Dispense Refill   amoxicillin-clavulanate (AUGMENTIN) 875-125  MG tablet Take 1 tablet by mouth 2 (two) times daily. 20 tablet 0   apixaban (ELIQUIS) 2.5 MG TABS tablet Take 1 tablet (2.5 mg total) by mouth 2 (two) times daily. 60 tablet 2   Cholecalciferol (VITAMIN D3) 2000 units capsule Take 2,000 Units by mouth daily.     diclofenac sodium (VOLTAREN) 1 % GEL Apply 2 g topically 4 (four) times daily as needed (joint pain).  11   diphenoxylate-atropine (LOMOTIL) 2.5-0.025 MG tablet Take 1 tablet by mouth 4 (four) times daily as needed for diarrhea or loose stools. 30 tablet 1   fluticasone (FLONASE) 50 MCG/ACT nasal spray Use 1 spray(s) in each nostril once daily 16 g 1   gabapentin (NEURONTIN) 100 MG capsule Take 1 capsule (100 mg total) by mouth at bedtime. 30 capsule 0   ipratropium (ATROVENT) 0.03 % nasal spray Place 1 spray into both nostrils 2 (two) times daily. 30 mL 2   lidocaine-prilocaine (EMLA) cream Apply 1 application topically as needed. 30 g 6   loperamide (IMODIUM) 2 MG capsule Take 1 capsule (2 mg total) by mouth See admin instructions. With onset of loose stool, take $RemoveBef'4mg'YRUxEZrqGh$  followed by $RemoveBef'2mg'ZYhEVymlWq$  every 2 hours,  Maximum:  16 mg/day 120 capsule 1   loratadine (CLARITIN) 10 MG tablet Take 10 mg by mouth daily.     Multiple Vitamins-Minerals (ONE-A-DAY WOMENS 50 PLUS PO) Take 1 tablet by mouth daily.      ondansetron (ZOFRAN) 8 MG tablet Take 1 tablet (8 mg total) by mouth 2 (two) times daily as needed for refractory nausea / vomiting. Start on day 3 after chemotherapy. 30 tablet 1   potassium chloride SA (KLOR-CON) 20 MEQ tablet Take 1 tablet (20 mEq total) by mouth 2 (two) times daily. 120 tablet 1   prochlorperazine (COMPAZINE) 10 MG tablet Take 1 tablet (10 mg total) by mouth every 6 (six) hours as needed (NAUSEA). 30 tablet 1   simvastatin (ZOCOR) 40 MG tablet Take 1 tablet (40 mg total) by mouth at bedtime. 90 tablet 3   triamterene-hydrochlorothiazide (DYAZIDE) 37.5-25 MG capsule Take 1 each (1 capsule total) by mouth daily. 90 capsule 3   zinc  gluconate 50 MG tablet Take 50 mg by mouth daily.     No current facility-administered medications for this visit.     PHYSICAL EXAMINATION: ECOG PERFORMANCE STATUS: 1 - Symptomatic but completely ambulatory  Physical Exam Constitutional:      General: She is not in acute distress. HENT:     Head: Normocephalic and atraumatic.  Eyes:     General: No scleral icterus. Cardiovascular:     Rate and Rhythm: Normal rate and regular rhythm.     Heart sounds: Normal heart sounds.  Pulmonary:     Effort: Pulmonary effort is normal. No respiratory distress.     Breath sounds: No wheezing.  Abdominal:     General: Bowel sounds are normal. There is no distension.     Palpations: Abdomen is soft.     Comments: + Colostomy bag   Musculoskeletal:        General: No deformity. Normal range of motion.     Cervical back: Normal range of motion and neck supple.  Skin:    General: Skin is warm and dry.     Findings: No erythema or rash.  Neurological:     Mental Status: She is alert and oriented to person, place, and time. Mental status is at baseline.     Cranial Nerves: No cranial nerve deficit.     Coordination: Coordination normal.      LABORATORY DATA:  I have reviewed the data as listed Lab Results  Component Value Date   WBC 6.0 08/30/2021   HGB 11.4 (L) 08/30/2021   HCT 35.7 (L) 08/30/2021   MCV 96.7 08/30/2021   PLT 231 08/30/2021   Recent Labs    08/02/21 0843 08/16/21 0823 08/30/21 0822  NA 137 136 133*  K 3.3* 3.2* 3.6  CL 102 101 100  CO2 $Re'27 26 22  'cXN$ GLUCOSE 120* 128* 115*  BUN $Re'8 8 10  'jMs$ CREATININE 1.07* 1.15* 1.03*  CALCIUM 9.1 9.1 9.3  GFRNONAA 55* 50* 57*  PROT 6.8 6.7 7.1  ALBUMIN 3.9 4.1 4.0  AST 27 33 29  ALT 16 24 36  ALKPHOS 67 84 110  BILITOT 0.4 0.4 0.3    Iron/TIBC/Ferritin/ %Sat    Component Value Date/Time   IRON 40 06/23/2020 1124   TIBC 246 (L) 06/23/2020 1124   FERRITIN 338 (H) 06/23/2020 1124   IRONPCTSAT 16 06/23/2020 1124      RADIOGRAPHIC STUDIES: I have personally reviewed the radiological images as listed and agreed with the findings in the report. No results  found.    No results found.   ASSESSMENT & PLAN:  1. Metastatic adenocarcinoma ([place])   2. Encounter for antineoplastic chemotherapy   3. Current use of long term anticoagulation   4. Chemotherapy induced diarrhea   5. History of pulmonary embolism   6. Hypokalemia   Cancer Staging Rectal cancer [facility]) Staging form: Colon and Rectum, AJCC 8th Edition - Clinical stage from 01/23/2019: Stage IIIC (cT4b, cN1a, cM0) - Signed by Earlie Server, MD on 01/23/2019 - Pathologic stage from 10/06/2019: Stage IIC (ypT4b, pN0, cM0) - Signed by Earlie Server, MD on 10/06/2019 - Pathologic: Stage Unknown (rpTX, pNX, cM1) - Signed by Earlie Server, MD on 01/31/2021  #History of stage IIIC Rectal cancer, s/p TNT, followed by 09/17/19 APR/posterior vaginectomy/TAH/BSO/VY-flap, pT4b pN0 with close vaginal margin 0.2 mm.  Uterus and ovaries negative for malignancy. palliative radiation to vaginal recurrence.  Finished 09/04/2020 01/19/21 recurrence with lung metastasis.-Palliative  05/18/2021 positive treatment response. CEA has decreased.-215 Labs reviewed and discussed with patient Proceed with FOLFIRI plus bevacizumab. -No 5-FU bolus. I will further reduce Irinotecan to $RemoveBefor'100mg'kPKmsbQjOszx$ /m2 Neulasta on day 3, Claritin for 4 days. Restaging CT was scheduled for evaluation of treatment response.  #Elevated creatinine, creatinine has improved.  Close to her baseline.  Encourage oral hydration.  # Post treatment hives, likely due to chemotherapy. Symptoms are better.  She gets additional one dose of IV benadryl $RemoveBef'25mg'LgAQkmbqYT$  x 1 if needed..  Current  oral benadryl as instructed PRN hive at home.   #Chemotherapy-induced neutropenia, see above.  On GCSF  #Chemotherapy-induced diarrhea, symptoms are stable.  Continue as needed continue Imodium or lomotil as needed..  Dose reduced Irinotecan.,  Omit 5-FU  bolus.  #Hypokalemia, potassium 3.6, continue potassium chloride to 20 mEq twice daily.  #History of pulmonary embolism, history of bilateral DVT Continue Eliquis 2.5 mg twice daily for anticoagulation prophylaxis.   Follow-up 2 weeks for next cycle of treatment.  Earlie Server, MD, PhD

## 2021-08-30 NOTE — Progress Notes (Signed)
Patient here follow up. Patient voices no concerns.

## 2021-09-01 ENCOUNTER — Inpatient Hospital Stay: Payer: Medicare Other

## 2021-09-01 ENCOUNTER — Other Ambulatory Visit: Payer: Self-pay

## 2021-09-01 ENCOUNTER — Ambulatory Visit
Admission: RE | Admit: 2021-09-01 | Discharge: 2021-09-01 | Disposition: A | Discharge status: Home / Self Care | Payer: Medicare Other | Source: Ambulatory Visit | Attending: Oncology | Admitting: Oncology

## 2021-09-01 VITALS — BP 125/75 | HR 72 | Temp 96.0°F | Resp 18

## 2021-09-01 DIAGNOSIS — Z87891 Personal history of nicotine dependence: Secondary | ICD-10-CM | POA: Diagnosis not present

## 2021-09-01 DIAGNOSIS — C799 Secondary malignant neoplasm of unspecified site: Secondary | ICD-10-CM

## 2021-09-01 DIAGNOSIS — K521 Toxic gastroenteritis and colitis: Secondary | ICD-10-CM | POA: Diagnosis not present

## 2021-09-01 DIAGNOSIS — Z86711 Personal history of pulmonary embolism: Secondary | ICD-10-CM | POA: Diagnosis not present

## 2021-09-01 DIAGNOSIS — I700 Atherosclerosis of aorta: Secondary | ICD-10-CM | POA: Diagnosis not present

## 2021-09-01 DIAGNOSIS — C20 Malignant neoplasm of rectum: Secondary | ICD-10-CM | POA: Insufficient documentation

## 2021-09-01 DIAGNOSIS — Z7901 Long term (current) use of anticoagulants: Secondary | ICD-10-CM | POA: Diagnosis not present

## 2021-09-01 DIAGNOSIS — E7800 Pure hypercholesterolemia, unspecified: Secondary | ICD-10-CM | POA: Diagnosis not present

## 2021-09-01 DIAGNOSIS — K5730 Diverticulosis of large intestine without perforation or abscess without bleeding: Secondary | ICD-10-CM | POA: Diagnosis not present

## 2021-09-01 DIAGNOSIS — Z79899 Other long term (current) drug therapy: Secondary | ICD-10-CM | POA: Diagnosis not present

## 2021-09-01 DIAGNOSIS — Z5189 Encounter for other specified aftercare: Secondary | ICD-10-CM | POA: Diagnosis not present

## 2021-09-01 DIAGNOSIS — R609 Edema, unspecified: Secondary | ICD-10-CM | POA: Diagnosis not present

## 2021-09-01 DIAGNOSIS — K219 Gastro-esophageal reflux disease without esophagitis: Secondary | ICD-10-CM | POA: Diagnosis not present

## 2021-09-01 DIAGNOSIS — K435 Parastomal hernia without obstruction or  gangrene: Secondary | ICD-10-CM | POA: Diagnosis not present

## 2021-09-01 DIAGNOSIS — I2510 Atherosclerotic heart disease of native coronary artery without angina pectoris: Secondary | ICD-10-CM | POA: Diagnosis not present

## 2021-09-01 DIAGNOSIS — E876 Hypokalemia: Secondary | ICD-10-CM | POA: Diagnosis not present

## 2021-09-01 DIAGNOSIS — I10 Essential (primary) hypertension: Secondary | ICD-10-CM | POA: Diagnosis not present

## 2021-09-01 DIAGNOSIS — Z800 Family history of malignant neoplasm of digestive organs: Secondary | ICD-10-CM | POA: Diagnosis not present

## 2021-09-01 DIAGNOSIS — I7789 Other specified disorders of arteries and arterioles: Secondary | ICD-10-CM | POA: Diagnosis not present

## 2021-09-01 DIAGNOSIS — Z933 Colostomy status: Secondary | ICD-10-CM | POA: Diagnosis not present

## 2021-09-01 DIAGNOSIS — C7982 Secondary malignant neoplasm of genital organs: Secondary | ICD-10-CM | POA: Diagnosis not present

## 2021-09-01 DIAGNOSIS — Z5111 Encounter for antineoplastic chemotherapy: Secondary | ICD-10-CM | POA: Diagnosis not present

## 2021-09-01 DIAGNOSIS — Z86718 Personal history of other venous thrombosis and embolism: Secondary | ICD-10-CM | POA: Diagnosis not present

## 2021-09-01 MED ORDER — HEPARIN SOD (PORK) LOCK FLUSH 100 UNIT/ML IV SOLN
500.0000 [IU] | Freq: Once | INTRAVENOUS | Status: AC | PRN
  Filled 2021-09-01: qty 5

## 2021-09-01 MED ORDER — SODIUM CHLORIDE 0.9% FLUSH
10.0000 mL | INTRAVENOUS | Status: DC | PRN
  Administered 2021-09-01: 13:00:00 10 mL
  Filled 2021-09-01: qty 10

## 2021-09-01 MED ORDER — HEPARIN SOD (PORK) LOCK FLUSH 100 UNIT/ML IV SOLN
INTRAVENOUS | Status: AC
  Administered 2021-09-01: 13:00:00 500 [IU]
  Filled 2021-09-01: qty 5

## 2021-09-01 MED ORDER — PEGFILGRASTIM 6 MG/0.6ML ~~LOC~~ PSKT
6.0000 mg | PREFILLED_SYRINGE | Freq: Once | SUBCUTANEOUS | Status: AC
  Administered 2021-09-01: 13:00:00 6 mg via SUBCUTANEOUS
  Filled 2021-09-01: qty 0.6

## 2021-09-02 DIAGNOSIS — C20 Malignant neoplasm of rectum: Secondary | ICD-10-CM | POA: Diagnosis not present

## 2021-09-02 DIAGNOSIS — C799 Secondary malignant neoplasm of unspecified site: Secondary | ICD-10-CM | POA: Diagnosis not present

## 2021-09-05 DIAGNOSIS — L989 Disorder of the skin and subcutaneous tissue, unspecified: Secondary | ICD-10-CM | POA: Diagnosis not present

## 2021-09-05 DIAGNOSIS — Z7951 Long term (current) use of inhaled steroids: Secondary | ICD-10-CM | POA: Diagnosis not present

## 2021-09-05 DIAGNOSIS — M1990 Unspecified osteoarthritis, unspecified site: Secondary | ICD-10-CM | POA: Diagnosis not present

## 2021-09-05 DIAGNOSIS — K219 Gastro-esophageal reflux disease without esophagitis: Secondary | ICD-10-CM | POA: Diagnosis not present

## 2021-09-05 DIAGNOSIS — I825Z3 Chronic embolism and thrombosis of unspecified deep veins of distal lower extremity, bilateral: Secondary | ICD-10-CM | POA: Diagnosis not present

## 2021-09-05 DIAGNOSIS — C7982 Secondary malignant neoplasm of genital organs: Secondary | ICD-10-CM | POA: Diagnosis not present

## 2021-09-05 DIAGNOSIS — T451X5D Adverse effect of antineoplastic and immunosuppressive drugs, subsequent encounter: Secondary | ICD-10-CM | POA: Diagnosis not present

## 2021-09-05 DIAGNOSIS — N182 Chronic kidney disease, stage 2 (mild): Secondary | ICD-10-CM | POA: Diagnosis not present

## 2021-09-05 DIAGNOSIS — L89312 Pressure ulcer of right buttock, stage 2: Secondary | ICD-10-CM | POA: Diagnosis not present

## 2021-09-05 DIAGNOSIS — Z87891 Personal history of nicotine dependence: Secondary | ICD-10-CM | POA: Diagnosis not present

## 2021-09-05 DIAGNOSIS — Z7901 Long term (current) use of anticoagulants: Secondary | ICD-10-CM | POA: Diagnosis not present

## 2021-09-05 DIAGNOSIS — Z933 Colostomy status: Secondary | ICD-10-CM | POA: Diagnosis not present

## 2021-09-05 DIAGNOSIS — Z4800 Encounter for change or removal of nonsurgical wound dressing: Secondary | ICD-10-CM | POA: Diagnosis not present

## 2021-09-05 DIAGNOSIS — C20 Malignant neoplasm of rectum: Secondary | ICD-10-CM | POA: Diagnosis not present

## 2021-09-05 DIAGNOSIS — I129 Hypertensive chronic kidney disease with stage 1 through stage 4 chronic kidney disease, or unspecified chronic kidney disease: Secondary | ICD-10-CM | POA: Diagnosis not present

## 2021-09-05 DIAGNOSIS — E876 Hypokalemia: Secondary | ICD-10-CM | POA: Diagnosis not present

## 2021-09-05 DIAGNOSIS — Z9181 History of falling: Secondary | ICD-10-CM | POA: Diagnosis not present

## 2021-09-05 DIAGNOSIS — E782 Mixed hyperlipidemia: Secondary | ICD-10-CM | POA: Diagnosis not present

## 2021-09-05 DIAGNOSIS — D701 Agranulocytosis secondary to cancer chemotherapy: Secondary | ICD-10-CM | POA: Diagnosis not present

## 2021-09-13 ENCOUNTER — Inpatient Hospital Stay: Payer: Medicare Other

## 2021-09-13 ENCOUNTER — Inpatient Hospital Stay (HOSPITAL_BASED_OUTPATIENT_CLINIC_OR_DEPARTMENT_OTHER): Payer: Medicare Other | Admitting: Oncology

## 2021-09-13 ENCOUNTER — Encounter: Payer: Self-pay | Admitting: Oncology

## 2021-09-13 ENCOUNTER — Other Ambulatory Visit: Payer: Self-pay

## 2021-09-13 VITALS — BP 107/72 | HR 81 | Temp 98.8°F | Wt 157.0 lb

## 2021-09-13 DIAGNOSIS — C20 Malignant neoplasm of rectum: Secondary | ICD-10-CM

## 2021-09-13 DIAGNOSIS — C799 Secondary malignant neoplasm of unspecified site: Secondary | ICD-10-CM

## 2021-09-13 DIAGNOSIS — Z5111 Encounter for antineoplastic chemotherapy: Secondary | ICD-10-CM

## 2021-09-13 DIAGNOSIS — C7982 Secondary malignant neoplasm of genital organs: Secondary | ICD-10-CM | POA: Diagnosis not present

## 2021-09-13 DIAGNOSIS — K219 Gastro-esophageal reflux disease without esophagitis: Secondary | ICD-10-CM | POA: Diagnosis not present

## 2021-09-13 DIAGNOSIS — K521 Toxic gastroenteritis and colitis: Secondary | ICD-10-CM

## 2021-09-13 DIAGNOSIS — I700 Atherosclerosis of aorta: Secondary | ICD-10-CM | POA: Diagnosis not present

## 2021-09-13 DIAGNOSIS — T451X5A Adverse effect of antineoplastic and immunosuppressive drugs, initial encounter: Secondary | ICD-10-CM

## 2021-09-13 DIAGNOSIS — Z79899 Other long term (current) drug therapy: Secondary | ICD-10-CM | POA: Diagnosis not present

## 2021-09-13 DIAGNOSIS — Z5189 Encounter for other specified aftercare: Secondary | ICD-10-CM | POA: Diagnosis not present

## 2021-09-13 DIAGNOSIS — E7800 Pure hypercholesterolemia, unspecified: Secondary | ICD-10-CM | POA: Diagnosis not present

## 2021-09-13 DIAGNOSIS — Z86718 Personal history of other venous thrombosis and embolism: Secondary | ICD-10-CM | POA: Diagnosis not present

## 2021-09-13 DIAGNOSIS — E876 Hypokalemia: Secondary | ICD-10-CM

## 2021-09-13 DIAGNOSIS — R609 Edema, unspecified: Secondary | ICD-10-CM | POA: Diagnosis not present

## 2021-09-13 DIAGNOSIS — Z7901 Long term (current) use of anticoagulants: Secondary | ICD-10-CM | POA: Diagnosis not present

## 2021-09-13 DIAGNOSIS — Z87891 Personal history of nicotine dependence: Secondary | ICD-10-CM | POA: Diagnosis not present

## 2021-09-13 DIAGNOSIS — Z800 Family history of malignant neoplasm of digestive organs: Secondary | ICD-10-CM | POA: Diagnosis not present

## 2021-09-13 DIAGNOSIS — I2510 Atherosclerotic heart disease of native coronary artery without angina pectoris: Secondary | ICD-10-CM | POA: Diagnosis not present

## 2021-09-13 DIAGNOSIS — Z933 Colostomy status: Secondary | ICD-10-CM | POA: Diagnosis not present

## 2021-09-13 DIAGNOSIS — Z86711 Personal history of pulmonary embolism: Secondary | ICD-10-CM

## 2021-09-13 DIAGNOSIS — I10 Essential (primary) hypertension: Secondary | ICD-10-CM | POA: Diagnosis not present

## 2021-09-13 LAB — CBC WITH DIFFERENTIAL/PLATELET
Abs Immature Granulocytes: 0.09 10*3/uL — ABNORMAL HIGH (ref 0.00–0.07)
Basophils Absolute: 0 10*3/uL (ref 0.0–0.1)
Basophils Relative: 1 %
Eosinophils Absolute: 0.1 10*3/uL (ref 0.0–0.5)
Eosinophils Relative: 1 %
HCT: 35 % — ABNORMAL LOW (ref 36.0–46.0)
Hemoglobin: 10.8 g/dL — ABNORMAL LOW (ref 12.0–15.0)
Immature Granulocytes: 1 %
Lymphocytes Relative: 20 %
Lymphs Abs: 1.3 10*3/uL (ref 0.7–4.0)
MCH: 30.4 pg (ref 26.0–34.0)
MCHC: 30.9 g/dL (ref 30.0–36.0)
MCV: 98.6 fL (ref 80.0–100.0)
Monocytes Absolute: 0.6 10*3/uL (ref 0.1–1.0)
Monocytes Relative: 9 %
Neutro Abs: 4.5 10*3/uL (ref 1.7–7.7)
Neutrophils Relative %: 68 %
Platelets: 205 10*3/uL (ref 150–400)
RBC: 3.55 MIL/uL — ABNORMAL LOW (ref 3.87–5.11)
RDW: 18.7 % — ABNORMAL HIGH (ref 11.5–15.5)
WBC: 6.6 10*3/uL (ref 4.0–10.5)
nRBC: 0.3 % — ABNORMAL HIGH (ref 0.0–0.2)

## 2021-09-13 LAB — COMPREHENSIVE METABOLIC PANEL
ALT: 22 U/L (ref 0–44)
AST: 24 U/L (ref 15–41)
Albumin: 4.1 g/dL (ref 3.5–5.0)
Alkaline Phosphatase: 107 U/L (ref 38–126)
Anion gap: 6 (ref 5–15)
BUN: 11 mg/dL (ref 8–23)
CO2: 26 mmol/L (ref 22–32)
Calcium: 9 mg/dL (ref 8.9–10.3)
Chloride: 105 mmol/L (ref 98–111)
Creatinine, Ser: 1.08 mg/dL — ABNORMAL HIGH (ref 0.44–1.00)
GFR, Estimated: 54 mL/min — ABNORMAL LOW (ref >60)
Glucose, Bld: 103 mg/dL — ABNORMAL HIGH (ref 70–99)
Potassium: 3.9 mmol/L (ref 3.5–5.1)
Sodium: 137 mmol/L (ref 135–145)
Total Bilirubin: 0.3 mg/dL (ref 0.3–1.2)
Total Protein: 6.5 g/dL (ref 6.5–8.1)

## 2021-09-13 LAB — PROTEIN, URINE, RANDOM: Total Protein, Urine: 15 mg/dL

## 2021-09-13 MED ORDER — SODIUM CHLORIDE 0.9 % IV SOLN
10.0000 mg | Freq: Once | INTRAVENOUS | Status: AC
  Administered 2021-09-13: 10 mg via INTRAVENOUS
  Filled 2021-09-13: qty 10

## 2021-09-13 MED ORDER — SODIUM CHLORIDE 0.9 % IV SOLN: 750.0000 mg | Freq: Once | INTRAVENOUS | Status: DC

## 2021-09-13 MED ORDER — DIPHENHYDRAMINE HCL 50 MG/ML IJ SOLN: 25.0000 mg | Freq: Every day | INTRAMUSCULAR | Status: DC | PRN

## 2021-09-13 MED ORDER — SODIUM CHLORIDE 0.9 % IV SOLN
750.0000 mg | Freq: Once | INTRAVENOUS | Status: AC
  Administered 2021-09-13: 750 mg via INTRAVENOUS
  Filled 2021-09-13: qty 37.5

## 2021-09-13 MED ORDER — SODIUM CHLORIDE 0.9 % IV SOLN
350.0000 mg | Freq: Once | INTRAVENOUS | Status: AC
  Administered 2021-09-13: 350 mg via INTRAVENOUS
  Filled 2021-09-13: qty 14

## 2021-09-13 MED ORDER — SODIUM CHLORIDE 0.9 % IV SOLN
2400.0000 mg/m2 | INTRAVENOUS | Status: DC
  Administered 2021-09-13: 4300 mg via INTRAVENOUS
  Filled 2021-09-13: qty 86

## 2021-09-13 MED ORDER — SODIUM CHLORIDE 0.9 % IV SOLN
Freq: Once | INTRAVENOUS | Status: AC
  Filled 2021-09-13: qty 250

## 2021-09-13 NOTE — Progress Notes (Signed)
Nutrition Follow-up:  Patient with metastatic rectal cancer to lung on palliative chemotherapy, no irinotecan.    Met with patient during infusion.  Patient reports that appetite is better and taste is better.  "I can taste foods better now."  Patient happy that she is eating better and able to taste her food.  Continues to drink ensure.  Diarrhea is better   Medications: lomotil, imodium  Labs: reviewed  Anthropometrics:   Weight 157 lb today  154 lb 12.8 oz on 11/15 160 lb 4 oz on 10/18   NUTRITION DIAGNOSIS: Altered GI function improving   INTERVENTION:  Continue oral nutrition supplement for added nutrition. Coupons given Encouraged foods high in protein    MONITORING, EVALUATION, GOAL: weight trends, intake   NEXT VISIT: as needed  Brittanya Winburn B. Zenia Resides, [place], [place] Registered Dietitian [phone] (mobile)

## 2021-09-13 NOTE — Progress Notes (Signed)
Per Dr. Tasia Catchings orders have been adjusted, no Irinotecan today. Pt to receive Decadron, Mvasi, Leucovorin and Adrucil **see MAR**.

## 2021-09-13 NOTE — Progress Notes (Signed)
Hematology/Oncology progress note [phone] [phone]   Patient Care Team: Olin Hauser, DO as PCP - General (Family Medicine) Clent Jacks, RN as Registered Nurse Earlie Server, MD as Consulting Physician (Hematology and Oncology)  CHIEF COMPLAINTS/REASON FOR VISIT:  Follow up for rectal cancer  HISTORY OF PRESENTING ILLNESS:  Patient initially presented with complaints of postmenopausal bleeding on 08/16/2018.  History of was menopausal vaginal bleeding in 2016 which resulted in cervical polypectomy.  Pathology 02/04/2015 showed cervical polyp, consistent with benign endometrial polyp.  [name] after polypectomy due to anxiety associated with pelvic exams.  pelvic exam on 08/16/2018 reviewed cervical abnormality and from enlarged uterus. Seen by Dr. Marcelline Mates on 10/29/2018.  Endometrial biopsy and a Pap smear was performed. 10/29/2018 Pap smear showed adenocarcinoma, favor endometrial origin. 10/29/2018 endometrial biopsy showed endometrioid carcinoma, FIGO grade 1.  10/29/2018- TA & TV Ultrasound revealed: Anteverted uterus measuring 8.7 x 5.6 x 6.4 cm without evidence of focal masses.  The endometrium measuring 24.1 mm (thickened) and heterogeneous.  Right and left ovaries not visualized.  No adnexal masses identified.  No free fluid in cul-de-sac.  Patient was seen by Dr. Theora Gianotti in clinic on 11/13/2018.  Cervical exam reveals 2 cm exophytic irregular mass consistent with malignancy.   11/19/2018 CT chest abdomen pelvis with contrast showed thickened endometrium with some irregularity compatible with the provided diagnosis of endometrial malignancy.  There is a mildly prominent left inguinal node 1.4 cm.  Patient was seen by Dr. Fransisca Connors on 11/20/2018 and left groin lymph node biopsy was recommended.  11/26/2018 patient underwent left inguinal lymph node biopsy. Pathology showed metastatic adenocarcinoma consistent with colorectal origin.  CDX  2+.  Case was discussed on tumor board.  Recommend colonoscopy for further evaluation.  Patient reports significant weight loss 30 pounds over the last year.  Chronic vaginal spotting. Change of bowel habits the past few months.  More constipated.  Family history positive for brother who has colon cancer prostate cancer.  patient has underwent colonoscopy on 12/03/2018 which reviewed a nonobstructing large mass in the rectum.  Also chronic fistula.  Mass was not circumferential.  This was biopsied with a cold forceps for histology.  Pathology came back hyperplastic polyp negative for dysplasia and malignancy. [name], patient underwent flex sigmoidoscopy on 12/06/2018 with rebiopsy of the rectal mass. This time biopsy results came back positive for invasive colorectal adenocarcinoma, moderately differentiated. Immunotherapy for nearly mismatch repair protein (MMR ) was performed.  There is no loss of MMR expression.  low probability of MSI high.   # Seen by [name] for evaluation of resectability for rectal cancer. In addition, she also had a second opinion with [facility] where her endometrial biopsy pathology was changed to  adenocarcinoma, consistent with colorectal primary.   [name]. She has home health that has been assisting with ostomy care  Patient was also evaluated by [facility] oncology.  Recommendation is to proceed with TNT with concurrent chemoradiation followed by neoadjuvant chemotherapy followed by surgical resection. Patient prefers to have treatment done locally with [facility].   # Oncology Treatment:  02/03/2019- 03/19/2019  concurrent Xeloda and radiation.  Xeloda dose 823m /m2 BID - rounded to 16584mBID- on days of radiation. 04/09/2019, started on FOLFOX with bolus early.  Omitted.  07/16/2019 finished 8 cycles of FOLFOX. 09/17/19 APR/posterior vaginectomy/TAH/BSO/VY-flap pT4b pN0 with close vaginal margin 0.2  mm.  Uterus and ovaries negative for malignancy. Patient  reports bilateral lower extremity numbness and tingling, intermittent, left worse than right. She has lost a lot of weight since her APR surgery.   #Family history with half brother having's history of colon cancer prostate cancer.  Personal history of colorectal cancer.  Patient has not decided if she wants genetic testing.    # history of PE( 01/13/2020)  in the bilateral lower extremity DVT (01/13/2020).   She finishes 6 months of anticoagulation with Eliquis 5 mg twice daily. Now switched to Eliquis 2.5 mg twice daily..  # She has now developed recurrent disease. #06/30/20  vaginal introitus mass biopsied. Pathology is consistent with metastatic colorectal adenocarcinoma I have discussed with [name]  Dr. Hester Mates and the mass is not resectable. Patient has also had colonoscopy by Dr. Vicente Males yesterday. Normal examination. # 07/16/2020 cycle 1 FOLFIRI  # 07/20/2020 PET scan was done for further evaluation, images are consistent with local recurrence, no distant metastasis. #Discussed with radiation oncology Dr. Baruch Gouty will recommends concurrent chemotherapy and radiation. 08/02/2020-08/16/2020, patient starts radiation.  Xeloda was held due to neutropenia 08/17/2020,-09/06/2020 Xeloda 1500 mg twice daily concurrently with radiation  01/31/21 started on FOLFIRI + Bev 05/18/2021 CT chest abdomen pelvis showed Previously noted enlargement of bilateral inguinal lymph nodes is resolved, consistent with treatment response of nodal metastatic disease. Interval decrease in size of multiple small bilateral pulmonary nodules, consistent with treatment response of pulmonary metastatic disease. No evidence of new metastatic disease. 05/24/2021 - 08/30/2021, continued on FOLFIRI plus bevacizumab.  Irinotecan dose was reduced, eventually 127m/m2  INTERVAL HISTORY KKEYUNNA COCOis a 75y.o. female who has above history reviewed by me presents for  follow-up of rectal cancer. She tolerates chemotherapy well, diarrhea has improved since the reduction of Irinotecan dose. Appetite is fair.,  She has gained 3 pounds since last visit. Review of Systems  Constitutional:  Positive for fatigue. Negative for appetite change, chills, fever and unexpected weight change.  HENT:   Negative for hearing loss and voice change.   Eyes:  Negative for eye problems.  Respiratory:  Negative for chest tightness and cough.   Cardiovascular:  Negative for chest pain.  Gastrointestinal:  Positive for diarrhea. Negative for abdominal distention, abdominal pain, blood in stool, constipation and nausea.  Endocrine: Negative for hot flashes.  Genitourinary:  Negative for difficulty urinating and frequency.   Musculoskeletal:  Negative for arthralgias.  Skin:  Negative for itching and rash.  Neurological:  Negative for extremity weakness and numbness.  Hematological:  Negative for adenopathy.  Psychiatric/Behavioral:  Negative for confusion.    MEDICAL HISTORY:  Past Medical History:  Diagnosis Date   Allergy    Arthritis    Blood clot in vein    Family history of colon cancer    GERD (gastroesophageal reflux disease)    Hypercholesteremia    Hypertension    Hypertension    Lower extremity edema    Rectal cancer ([place] 12/2018   Urinary incontinence     SURGICAL HISTORY: Past Surgical History:  Procedure Laterality Date   ABDOMINAL HYSTERECTOMY     CHOLECYSTECTOMY  1971   COLONOSCOPY WITH PROPOFOL N/A 12/03/2018   Procedure: COLONOSCOPY WITH PROPOFOL;  Surgeon: WLucilla Lame MD;  Location: [facility] ENDOSCOPY;  Service: Endoscopy;  Laterality: N/A;   COLONOSCOPY WITH PROPOFOL N/A 07/15/2020   Procedure: COLONOSCOPY WITH PROPOFOL;  Surgeon: AJonathon Bellows MD;  [facility];  Service: Gastroenterology;  Laterality: N/A;   FLEXIBLE SIGMOIDOSCOPY N/A 12/06/2018   Procedure: FLEXIBLE SIGMOIDOSCOPY;  Surgeon: [name], MD;  Location: [facility]  ENDOSCOPY;  Service: Endoscopy;  Laterality: N/A;   LAPAROSCOPIC COLOSTOMY  01/06/2019   PORTACATH PLACEMENT N/A 04/03/2019   Procedure: INSERTION PORT-A-CATH;  Surgeon: Jules Husbands, MD;  Location: [facility] ORS;  Service: General;  Laterality: N/A;    SOCIAL HISTORY: Social History   Socioeconomic History   Marital status: Widowed    Spouse name: Not on file   Number of children: Not on file   Years of education: Not on file   Highest education level: Not on file  Occupational History   Not on file  Tobacco Use   Smoking status: Former    Types: Cigarettes    Quit date: 12/02/1977    Years since quitting: 43.8   Smokeless tobacco: Former  Scientific laboratory technician Use: Never used  Substance and Sexual Activity   Alcohol use: Never   Drug use: Never   Sexual activity: Not Currently    Birth control/protection: None  Other Topics Concern   Not on file  Social History Narrative   Lives with daughter   Social Determinants of Health   Financial Resource Strain: Low Risk    Difficulty of Paying Living Expenses: Not hard at all  Food Insecurity: No Food Insecurity   Worried About Charity fundraiser in the Last Year: Never true   [place] in the Last Year: Never true  Transportation Needs: No Transportation Needs   Lack of Transportation (Medical): No   Lack of Transportation (Non-Medical): No  Physical Activity: Inactive   Days of Exercise per Week: 0 days   Minutes of Exercise per Session: 0 min  Stress: No Stress Concern Present   Feeling of Stress : Not at all  Social Connections: Not on file  Intimate Partner Violence: Not on file    FAMILY HISTORY: Family History  Problem Relation Age of Onset   Colon cancer Brother 57       exposure to chemicals [place]   Hypertension Mother    Stroke Mother    Kidney failure Father    Breast cancer Neg Hx    Ovarian cancer Neg Hx     ALLERGIES:  is allergic to sulfamethoxazole-trimethoprim.  MEDICATIONS:  Current  Outpatient Medications  Medication Sig Dispense Refill   amoxicillin-clavulanate (AUGMENTIN) 875-125 MG tablet Take 1 tablet by mouth 2 (two) times daily. 20 tablet 0   apixaban (ELIQUIS) 2.5 MG TABS tablet Take 1 tablet (2.5 mg total) by mouth 2 (two) times daily. 60 tablet 2   Cholecalciferol (VITAMIN D3) 2000 units capsule Take 2,000 Units by mouth daily.     diclofenac sodium (VOLTAREN) 1 % GEL Apply 2 g topically 4 (four) times daily as needed (joint pain).  11   diphenoxylate-atropine (LOMOTIL) 2.5-0.025 MG tablet Take 1 tablet by mouth 4 (four) times daily as needed for diarrhea or loose stools. 30 tablet 1   fluticasone (FLONASE) 50 MCG/ACT nasal spray Use 1 spray(s) in each nostril once daily 16 g 1   gabapentin (NEURONTIN) 100 MG capsule Take 1 capsule (100 mg total) by mouth at bedtime. 30 capsule 0   ipratropium (ATROVENT) 0.03 % nasal spray Place 1 spray into both nostrils 2 (two) times daily. 30 mL 2   lidocaine-prilocaine (EMLA) cream Apply 1 application topically as needed. 30 g 6   loperamide (IMODIUM) 2 MG capsule Take 1 capsule (2 mg total) by mouth See admin instructions. With onset of loose stool, take  49m followed by 237mevery 2 hours,  Maximum: 16 mg/day 120 capsule 1   loratadine (CLARITIN) 10 MG tablet Take 10 mg by mouth daily.     Multiple Vitamins-Minerals (ONE-A-DAY WOMENS 50 PLUS PO) Take 1 tablet by mouth daily.      ondansetron (ZOFRAN) 8 MG tablet Take 1 tablet (8 mg total) by mouth 2 (two) times daily as needed for refractory nausea / vomiting. Start on day 3 after chemotherapy. 30 tablet 1   potassium chloride SA (KLOR-CON) 20 MEQ tablet Take 1 tablet (20 mEq total) by mouth 2 (two) times daily. 120 tablet 1   prochlorperazine (COMPAZINE) 10 MG tablet Take 1 tablet (10 mg total) by mouth every 6 (six) hours as needed (NAUSEA). 30 tablet 1   simvastatin (ZOCOR) 40 MG tablet Take 1 tablet (40 mg total) by mouth at bedtime. 90 tablet 3    triamterene-hydrochlorothiazide (DYAZIDE) 37.5-25 MG capsule Take 1 each (1 capsule total) by mouth daily. 90 capsule 3   zinc gluconate 50 MG tablet Take 50 mg by mouth daily.     No current facility-administered medications for this visit.     PHYSICAL EXAMINATION: ECOG PERFORMANCE STATUS: 1 - Symptomatic but completely ambulatory  Physical Exam Constitutional:      General: She is not in acute distress. HENT:     Head: Normocephalic and atraumatic.  Eyes:     General: No scleral icterus. Cardiovascular:     Rate and Rhythm: Normal rate and regular rhythm.     Heart sounds: Normal heart sounds.  Pulmonary:     Effort: Pulmonary effort is normal. No respiratory distress.     Breath sounds: No wheezing.  Abdominal:     General: Bowel sounds are normal. There is no distension.     Palpations: Abdomen is soft.     Comments: + Colostomy bag   Musculoskeletal:        General: No deformity. Normal range of motion.     Cervical back: Normal range of motion and neck supple.  Skin:    General: Skin is warm and dry.     Findings: No erythema or rash.  Neurological:     Mental Status: She is alert and oriented to person, place, and time. Mental status is at baseline.     Cranial Nerves: No cranial nerve deficit.     Coordination: Coordination normal.      LABORATORY DATA:  I have reviewed the data as listed Lab Results  Component Value Date   WBC 6.6 09/13/2021   HGB 10.8 (L) 09/13/2021   HCT 35.0 (L) 09/13/2021   MCV 98.6 09/13/2021   PLT 205 09/13/2021   Recent Labs    08/16/21 0823 08/30/21 0822 09/13/21 0821  NA 136 133* 137  K 3.2* 3.6 3.9  CL 101 100 105  CO2 _0 GLUCOSE 128* 115* 103*  BUN _1 CREATININE 1.15* 1.03* 1.08*  CALCIUM 9.1 9.3 9.0  GFRNONAA 50* 57* 54*  PROT 6.7 7.1 6.5  ALBUMIN 4.1 4.0 4.1  AST 33 29 24  ALT 24 36 22  ALKPHOS 84 110 107  BILITOT 0.4 0.3 0.3    Iron/TIBC/Ferritin/ %Sat    Component Value Date/Time   IRON  40 06/23/2020 1124   TIBC 246 (L) 06/23/2020 1124   FERRITIN 338 (H) 06/23/2020 1124   IRONPCTSAT 16 06/23/2020 1124     RADIOGRAPHIC STUDIES: I have personally reviewed the radiological images as listed and  agreed with the findings in the report. CT CHEST ABDOMEN PELVIS WO CONTRAST  Result Date: 09/02/2021 CLINICAL DATA:  Metastatic rectal cancer, ongoing chemotherapy EXAM: CT CHEST, ABDOMEN AND PELVIS WITHOUT CONTRAST TECHNIQUE: Multidetector CT imaging of the chest, abdomen and pelvis was performed following the standard protocol without IV contrast. COMPARISON:  05/18/2021 FINDINGS: CT CHEST FINDINGS Cardiovascular: Right chest port catheter. Aortic atherosclerosis. Normal heart size. Left and right coronary artery calcifications. No pericardial effusion. Enlargement of the main pulmonary artery, measuring up to 3.7 cm in caliber. Mediastinum/Nodes: No enlarged mediastinal, hilar, or axillary lymph nodes. Thyroid gland, trachea, and esophagus demonstrate no significant findings. Lungs/Pleura: Multiple small bilateral pulmonary nodules are not significantly changed, for example a 0.5 cm nodule of the medial segment right middle lobe (series 3, image 103) and a 0.3 cm nodule of the posterior right upper lobe (series 3, image 3). No pleural effusion or pneumothorax. Musculoskeletal: No chest wall mass or suspicious bone lesions identified. CT ABDOMEN PELVIS FINDINGS Hepatobiliary: No focal liver abnormality is seen. Status post cholecystectomy. Postoperative biliary dilatation. Pancreas: Unremarkable. No pancreatic ductal dilatation or surrounding inflammatory changes. Spleen: Normal in size without significant abnormality. Adrenals/Urinary Tract: Adrenal glands are unremarkable. Multiple bilateral renal cysts. Kidneys are otherwise normal, without renal calculi, solid lesion, or hydronephrosis. Bladder is unremarkable. Stomach/Bowel: Stomach is within normal limits. Appendix appears normal. No evidence  of bowel wall thickening, distention, or inflammatory changes. Status post abdominoperineal resection and left lower quadrant end colostomy. Small parastomal hernia. Diverticulosis of the remnant sigmoid colon. Vascular/Lymphatic: Aortic atherosclerosis. No enlarged abdominal or pelvic lymph nodes. Reproductive: Status post hysterectomy. Other: No abdominal wall hernia or abnormality. No abdominopelvic ascites. Musculoskeletal: No acute or significant osseous findings. IMPRESSION: 1. Multiple small bilateral pulmonary nodules are not significantly changed, consistent with stable metastatic disease. 2. No noncontrast CT evidence of new metastatic disease in the chest, abdomen or pelvis. 3. Status post abdominoperineal resection and left lower quadrant end colostomy. Small parastomal hernia. 4. Enlargement of the main pulmonary artery, as can be seen in pulmonary hypertension. 5. Coronary artery disease. Aortic Atherosclerosis (ICD10-I70.0). Electronically Signed   By: Delanna Ahmadi M.D.   On: 09/02/2021 10:39      CT CHEST ABDOMEN PELVIS WO CONTRAST  Result Date: 09/02/2021 CLINICAL DATA:  Metastatic rectal cancer, ongoing chemotherapy EXAM: CT CHEST, ABDOMEN AND PELVIS WITHOUT CONTRAST TECHNIQUE: Multidetector CT imaging of the chest, abdomen and pelvis was performed following the standard protocol without IV contrast. COMPARISON:  05/18/2021 FINDINGS: CT CHEST FINDINGS Cardiovascular: Right chest port catheter. Aortic atherosclerosis. Normal heart size. Left and right coronary artery calcifications. No pericardial effusion. Enlargement of the main pulmonary artery, measuring up to 3.7 cm in caliber. Mediastinum/Nodes: No enlarged mediastinal, hilar, or axillary lymph nodes. Thyroid gland, trachea, and esophagus demonstrate no significant findings. Lungs/Pleura: Multiple small bilateral pulmonary nodules are not significantly changed, for example a 0.5 cm nodule of the medial segment right middle lobe (series  3, image 103) and a 0.3 cm nodule of the posterior right upper lobe (series 3, image 3). No pleural effusion or pneumothorax. Musculoskeletal: No chest wall mass or suspicious bone lesions identified. CT ABDOMEN PELVIS FINDINGS Hepatobiliary: No focal liver abnormality is seen. Status post cholecystectomy. Postoperative biliary dilatation. Pancreas: Unremarkable. No pancreatic ductal dilatation or surrounding inflammatory changes. Spleen: Normal in size without significant abnormality. Adrenals/Urinary Tract: Adrenal glands are unremarkable. Multiple bilateral renal cysts. Kidneys are otherwise normal, without renal calculi, solid lesion, or hydronephrosis. Bladder is unremarkable. Stomach/Bowel: Stomach is  within normal limits. Appendix appears normal. No evidence of bowel wall thickening, distention, or inflammatory changes. Status post abdominoperineal resection and left lower quadrant end colostomy. Small parastomal hernia. Diverticulosis of the remnant sigmoid colon. Vascular/Lymphatic: Aortic atherosclerosis. No enlarged abdominal or pelvic lymph nodes. Reproductive: Status post hysterectomy. Other: No abdominal wall hernia or abnormality. No abdominopelvic ascites. Musculoskeletal: No acute or significant osseous findings. IMPRESSION: 1. Multiple small bilateral pulmonary nodules are not significantly changed, consistent with stable metastatic disease. 2. No noncontrast CT evidence of new metastatic disease in the chest, abdomen or pelvis. 3. Status post abdominoperineal resection and left lower quadrant end colostomy. Small parastomal hernia. 4. Enlargement of the main pulmonary artery, as can be seen in pulmonary hypertension. 5. Coronary artery disease. Aortic Atherosclerosis (ICD10-I70.0). Electronically Signed   By: Delanna Ahmadi M.D.   On: 09/02/2021 10:39     ASSESSMENT & PLAN:  1. Metastatic adenocarcinoma ([place])   2. Rectal cancer ([place])   3. Encounter for antineoplastic chemotherapy   4.  Chemotherapy induced diarrhea   5. History of pulmonary embolism   6. Hypokalemia    Cancer Staging  Rectal cancer [facility]) Staging form: Colon and Rectum, AJCC 8th Edition - Clinical stage from 01/23/2019: Stage IIIC (cT4b, cN1a, cM0) - Signed by Earlie Server, MD on 01/23/2019 - Pathologic stage from 10/06/2019: Stage IIC (ypT4b, pN0, cM0) - Signed by Earlie Server, MD on 10/06/2019 - Pathologic: Stage Unknown (rpTX, pNX, cM1) - Signed by Earlie Server, MD on 01/31/2021  #History of stage IIIC Rectal cancer, s/p TNT, followed by 09/17/19 APR/posterior vaginectomy/TAH/BSO/VY-flap, pT4b pN0 with close vaginal margin 0.2 mm.  Uterus and ovaries negative for malignancy. palliative radiation to vaginal recurrence.  Finished 09/04/2020 01/19/21 recurrence with lung metastasis.-Palliative  05/18/2021 positive treatment response. CEA has decreased.-215 Labs reviewed and discussed with patient 09/02/2021, CT chest abdomen pelvis without contrast Showed small bilateral pulmonary nodules, unchanged.  Stable metastatic disease.  No noncontrast evidence of new metastatic disease in the chest abdomen pelvis.  Small parastomal hernia.  Enlargement of main pulmonary artery.  Coronary artery disease.  I discussed with the patient and shared decision was made to omit Irinotecan at this point, safe for future use if progression on maintenance of 5-FU/bevacizumab. Hold off Neulasta G-CSF support given the de-escalation of regimen.  Monitor counts.  #Chemotherapy-induced diarrhea, symptoms are stable.  Continue as needed continue Imodium or lomotil as needed.Marland Kitchen    #Hypokalemia, potassium 3.9, continue potassium chloride to 20 mEq twice daily.  #History of pulmonary embolism, history of bilateral DVT Continue Eliquis 2.5 mg twice daily for anticoagulation prophylaxis.   Follow-up 2 weeks for next cycle of treatment.  Earlie Server, MD, PhD

## 2021-09-14 DIAGNOSIS — Z7951 Long term (current) use of inhaled steroids: Secondary | ICD-10-CM | POA: Diagnosis not present

## 2021-09-14 DIAGNOSIS — Z87891 Personal history of nicotine dependence: Secondary | ICD-10-CM | POA: Diagnosis not present

## 2021-09-14 DIAGNOSIS — M1990 Unspecified osteoarthritis, unspecified site: Secondary | ICD-10-CM | POA: Diagnosis not present

## 2021-09-14 DIAGNOSIS — N182 Chronic kidney disease, stage 2 (mild): Secondary | ICD-10-CM | POA: Diagnosis not present

## 2021-09-14 DIAGNOSIS — T451X5D Adverse effect of antineoplastic and immunosuppressive drugs, subsequent encounter: Secondary | ICD-10-CM | POA: Diagnosis not present

## 2021-09-14 DIAGNOSIS — Z7901 Long term (current) use of anticoagulants: Secondary | ICD-10-CM | POA: Diagnosis not present

## 2021-09-14 DIAGNOSIS — K219 Gastro-esophageal reflux disease without esophagitis: Secondary | ICD-10-CM | POA: Diagnosis not present

## 2021-09-14 DIAGNOSIS — I825Z3 Chronic embolism and thrombosis of unspecified deep veins of distal lower extremity, bilateral: Secondary | ICD-10-CM | POA: Diagnosis not present

## 2021-09-14 DIAGNOSIS — I129 Hypertensive chronic kidney disease with stage 1 through stage 4 chronic kidney disease, or unspecified chronic kidney disease: Secondary | ICD-10-CM | POA: Diagnosis not present

## 2021-09-14 DIAGNOSIS — C7982 Secondary malignant neoplasm of genital organs: Secondary | ICD-10-CM | POA: Diagnosis not present

## 2021-09-14 DIAGNOSIS — Z9181 History of falling: Secondary | ICD-10-CM | POA: Diagnosis not present

## 2021-09-14 DIAGNOSIS — Z933 Colostomy status: Secondary | ICD-10-CM | POA: Diagnosis not present

## 2021-09-14 DIAGNOSIS — D701 Agranulocytosis secondary to cancer chemotherapy: Secondary | ICD-10-CM | POA: Diagnosis not present

## 2021-09-14 DIAGNOSIS — L89312 Pressure ulcer of right buttock, stage 2: Secondary | ICD-10-CM | POA: Diagnosis not present

## 2021-09-14 DIAGNOSIS — E876 Hypokalemia: Secondary | ICD-10-CM | POA: Diagnosis not present

## 2021-09-14 DIAGNOSIS — E782 Mixed hyperlipidemia: Secondary | ICD-10-CM | POA: Diagnosis not present

## 2021-09-14 DIAGNOSIS — Z4800 Encounter for change or removal of nonsurgical wound dressing: Secondary | ICD-10-CM | POA: Diagnosis not present

## 2021-09-14 DIAGNOSIS — L989 Disorder of the skin and subcutaneous tissue, unspecified: Secondary | ICD-10-CM | POA: Diagnosis not present

## 2021-09-14 DIAGNOSIS — C20 Malignant neoplasm of rectum: Secondary | ICD-10-CM | POA: Diagnosis not present

## 2021-09-14 LAB — CEA: CEA: 164 ng/mL — ABNORMAL HIGH (ref 0.0–4.7)

## 2021-09-15 ENCOUNTER — Inpatient Hospital Stay: Payer: Medicare Other | Attending: Oncology

## 2021-09-15 ENCOUNTER — Other Ambulatory Visit: Payer: Self-pay

## 2021-09-15 VITALS — BP 138/66 | HR 76 | Temp 97.0°F | Resp 18

## 2021-09-15 DIAGNOSIS — Z86711 Personal history of pulmonary embolism: Secondary | ICD-10-CM | POA: Diagnosis not present

## 2021-09-15 DIAGNOSIS — C775 Secondary and unspecified malignant neoplasm of intrapelvic lymph nodes: Secondary | ICD-10-CM | POA: Diagnosis not present

## 2021-09-15 DIAGNOSIS — Z86010 Personal history of colonic polyps: Secondary | ICD-10-CM | POA: Diagnosis not present

## 2021-09-15 DIAGNOSIS — R197 Diarrhea, unspecified: Secondary | ICD-10-CM | POA: Insufficient documentation

## 2021-09-15 DIAGNOSIS — Z923 Personal history of irradiation: Secondary | ICD-10-CM | POA: Insufficient documentation

## 2021-09-15 DIAGNOSIS — Z5111 Encounter for antineoplastic chemotherapy: Secondary | ICD-10-CM | POA: Insufficient documentation

## 2021-09-15 DIAGNOSIS — I2510 Atherosclerotic heart disease of native coronary artery without angina pectoris: Secondary | ICD-10-CM | POA: Diagnosis not present

## 2021-09-15 DIAGNOSIS — C799 Secondary malignant neoplasm of unspecified site: Secondary | ICD-10-CM

## 2021-09-15 DIAGNOSIS — C20 Malignant neoplasm of rectum: Secondary | ICD-10-CM | POA: Diagnosis not present

## 2021-09-15 DIAGNOSIS — Z86718 Personal history of other venous thrombosis and embolism: Secondary | ICD-10-CM | POA: Diagnosis not present

## 2021-09-15 DIAGNOSIS — Z800 Family history of malignant neoplasm of digestive organs: Secondary | ICD-10-CM | POA: Diagnosis not present

## 2021-09-15 MED ORDER — HEPARIN SOD (PORK) LOCK FLUSH 100 UNIT/ML IV SOLN
INTRAVENOUS | Status: AC
  Administered 2021-09-15: 500 [IU]
  Filled 2021-09-15: qty 5

## 2021-09-15 MED ORDER — HEPARIN SOD (PORK) LOCK FLUSH 100 UNIT/ML IV SOLN
500.0000 [IU] | Freq: Once | INTRAVENOUS | Status: AC | PRN
  Filled 2021-09-15: qty 5

## 2021-09-15 MED ORDER — SODIUM CHLORIDE 0.9% FLUSH
10.0000 mL | INTRAVENOUS | Status: DC | PRN
  Administered 2021-09-15: 10 mL
  Filled 2021-09-15: qty 10

## 2021-09-15 NOTE — Progress Notes (Addendum)
Pt arrived to clinic for chemo pump d/c. No complaints. VSS.

## 2021-09-15 NOTE — Patient Instructions (Signed)
[facility] CANCER CTR AT [place]   Discharge Instructions: Thank you for choosing [facility] to provide your oncology and hematology care.  If you have a lab appointment with the [facility], please go directly to the [facility] and check in at the registration area.  Wear comfortable clothing and clothing appropriate for easy access to any Portacath or PICC line.   We strive to give you quality time with your provider. You may need to reschedule your appointment if you arrive late (15 or more minutes).  Arriving late affects you and other patients whose appointments are after yours.  Also, if you miss three or more appointments without notifying the office, you may be dismissed from the clinic at the provider's discretion.      For prescription refill requests, have your pharmacy contact our office and allow 72 hours for refills to be completed.     To help prevent nausea and vomiting after your treatment, we encourage you to take your nausea medication as directed.  BELOW ARE SYMPTOMS THAT SHOULD BE REPORTED IMMEDIATELY: *FEVER GREATER THAN 100.4 F (38 C) OR HIGHER *CHILLS OR SWEATING *NAUSEA AND VOMITING THAT IS NOT CONTROLLED WITH YOUR NAUSEA MEDICATION *UNUSUAL SHORTNESS OF BREATH *UNUSUAL BRUISING OR BLEEDING *URINARY PROBLEMS (pain or burning when urinating, or frequent urination) *BOWEL PROBLEMS (unusual diarrhea, constipation, pain near the anus) TENDERNESS IN MOUTH AND THROAT WITH OR WITHOUT PRESENCE OF ULCERS (sore throat, sores in mouth, or a toothache) UNUSUAL RASH, SWELLING OR PAIN  UNUSUAL VAGINAL DISCHARGE OR ITCHING   Items with * indicate a potential emergency and should be followed up as soon as possible or go to the Emergency Department if any problems should occur.  Please show the CHEMOTHERAPY ALERT CARD or IMMUNOTHERAPY ALERT CARD at check-in to the Emergency Department and triage nurse.  Should you have questions after your visit  or need to cancel or reschedule your appointment, please contact [facility] CANCER [place] AT [place]  [phone] and follow the prompts.  Office hours are 8:00 a.m. to 4:30 p.m. Monday - Friday. Please note that voicemails left after 4:00 p.m. may not be returned until the following business day.  We are closed weekends and major holidays. You have access to a nurse at all times for urgent questions. Please call the main number to the clinic [phone] and follow the prompts.  For any non-urgent questions, you may also contact your provider using MyChart. We now offer e-Visits for anyone 73 and older to request care online for non-urgent symptoms. For details visit [email or web address].   Also download the MyChart app! Go to the app store, search "MyChart", open the app, select [facility], and log in with your MyChart username and password.  Due to Covid, a mask is required upon entering the hospital/clinic. If you do not have a mask, one will be given to you upon arrival. For doctor visits, patients may have 1 support person aged 67 or older with them. For treatment visits, patients cannot have anyone with them due to current Covid guidelines and our immunocompromised population.

## 2021-09-19 DIAGNOSIS — E876 Hypokalemia: Secondary | ICD-10-CM | POA: Diagnosis not present

## 2021-09-19 DIAGNOSIS — Z7951 Long term (current) use of inhaled steroids: Secondary | ICD-10-CM | POA: Diagnosis not present

## 2021-09-19 DIAGNOSIS — N182 Chronic kidney disease, stage 2 (mild): Secondary | ICD-10-CM | POA: Diagnosis not present

## 2021-09-19 DIAGNOSIS — Z9181 History of falling: Secondary | ICD-10-CM | POA: Diagnosis not present

## 2021-09-19 DIAGNOSIS — K219 Gastro-esophageal reflux disease without esophagitis: Secondary | ICD-10-CM | POA: Diagnosis not present

## 2021-09-19 DIAGNOSIS — I825Z3 Chronic embolism and thrombosis of unspecified deep veins of distal lower extremity, bilateral: Secondary | ICD-10-CM | POA: Diagnosis not present

## 2021-09-19 DIAGNOSIS — D701 Agranulocytosis secondary to cancer chemotherapy: Secondary | ICD-10-CM | POA: Diagnosis not present

## 2021-09-19 DIAGNOSIS — T451X5D Adverse effect of antineoplastic and immunosuppressive drugs, subsequent encounter: Secondary | ICD-10-CM | POA: Diagnosis not present

## 2021-09-19 DIAGNOSIS — C7982 Secondary malignant neoplasm of genital organs: Secondary | ICD-10-CM | POA: Diagnosis not present

## 2021-09-19 DIAGNOSIS — Z87891 Personal history of nicotine dependence: Secondary | ICD-10-CM | POA: Diagnosis not present

## 2021-09-19 DIAGNOSIS — I129 Hypertensive chronic kidney disease with stage 1 through stage 4 chronic kidney disease, or unspecified chronic kidney disease: Secondary | ICD-10-CM | POA: Diagnosis not present

## 2021-09-19 DIAGNOSIS — C20 Malignant neoplasm of rectum: Secondary | ICD-10-CM | POA: Diagnosis not present

## 2021-09-19 DIAGNOSIS — L989 Disorder of the skin and subcutaneous tissue, unspecified: Secondary | ICD-10-CM | POA: Diagnosis not present

## 2021-09-19 DIAGNOSIS — Z7901 Long term (current) use of anticoagulants: Secondary | ICD-10-CM | POA: Diagnosis not present

## 2021-09-19 DIAGNOSIS — E782 Mixed hyperlipidemia: Secondary | ICD-10-CM | POA: Diagnosis not present

## 2021-09-19 DIAGNOSIS — Z933 Colostomy status: Secondary | ICD-10-CM | POA: Diagnosis not present

## 2021-09-19 DIAGNOSIS — Z4800 Encounter for change or removal of nonsurgical wound dressing: Secondary | ICD-10-CM | POA: Diagnosis not present

## 2021-09-19 DIAGNOSIS — L89312 Pressure ulcer of right buttock, stage 2: Secondary | ICD-10-CM | POA: Diagnosis not present

## 2021-09-19 DIAGNOSIS — M1990 Unspecified osteoarthritis, unspecified site: Secondary | ICD-10-CM | POA: Diagnosis not present

## 2021-09-26 ENCOUNTER — Inpatient Hospital Stay (HOSPITAL_BASED_OUTPATIENT_CLINIC_OR_DEPARTMENT_OTHER): Payer: Medicare Other | Admitting: Hospice and Palliative Medicine

## 2021-09-26 DIAGNOSIS — E876 Hypokalemia: Secondary | ICD-10-CM

## 2021-09-26 MED ORDER — POTASSIUM CHLORIDE CRYS ER 20 MEQ PO TBCR: 40.0000 meq | EXTENDED_RELEASE_TABLET | Freq: Two times a day (BID) | ORAL | 1 refills | Status: DC

## 2021-09-26 NOTE — Progress Notes (Signed)
Virtual Visit via Telephone Note  I connected with Jacqueline Yoder on 09/26/21 at  2:00 PM EST by telephone and verified that I am speaking with the correct person using two identifiers.  Location: Patient: Home Provider: Clinic   I discussed the limitations, risks, security and privacy concerns of performing an evaluation and management service by telephone and the availability of in person appointments. I also discussed with the patient that there may be a patient responsible charge related to this service. The patient expressed understanding and agreed to proceed.   History of Present Illness: Jacqueline Yoder is a 75 y.o. female with multiple medical problems including stage IV colorectal cancer initially diagnosed 10/29/2018 stage IIIC disease.  Patient underwent APR/posteriorvaginectomy/TAH/BSO/VY-flap and chemoradiation.  She developed local recurrence 07/20/2020 and received additional chemoradiation.  CT of the chest, abdomen, and pelvis on 01/19/2021 revealed multiple scattered new pulmonary nodules consistent with disease progression.  Palliative care was consulted to progress goals and manage ongoing symptoms.   Observations/Objective: I called and spoke with patient by phone.    Patient reports she is doing well.  She denies any significant changes or concerns.  She was having chronic diarrhea but this is resolved with adjustment to her chemotherapy.  Patient says she now has to take a stool softener.  She denies significant symptomatic complaints at present.  Patient request refill of her potassium.  Assessment and Plan: Stage IV colorectal cancer -seems to be tolerating treatment reasonably well. Will continue to follow.   Follow Up Instructions: Follow-up telephone visit 2 to 3 months   I discussed the assessment and treatment plan with the patient. The patient was provided an opportunity to ask questions and all were answered. The patient agreed with the plan and demonstrated an  understanding of the instructions.   The patient was advised to call back or seek an in-person evaluation if the symptoms worsen or if the condition fails to improve as anticipated.  I provided 5 minutes of non-face-to-face time during this encounter.   Irean Hong, NP

## 2021-09-27 ENCOUNTER — Encounter: Payer: Self-pay | Admitting: Oncology

## 2021-09-27 ENCOUNTER — Inpatient Hospital Stay (HOSPITAL_BASED_OUTPATIENT_CLINIC_OR_DEPARTMENT_OTHER): Payer: Medicare Other | Admitting: Oncology

## 2021-09-27 ENCOUNTER — Other Ambulatory Visit: Payer: Self-pay

## 2021-09-27 ENCOUNTER — Inpatient Hospital Stay: Payer: Medicare Other

## 2021-09-27 VITALS — BP 105/65 | HR 74 | Temp 98.4°F | Resp 18 | Wt 158.8 lb

## 2021-09-27 DIAGNOSIS — K521 Toxic gastroenteritis and colitis: Secondary | ICD-10-CM | POA: Diagnosis not present

## 2021-09-27 DIAGNOSIS — Z86718 Personal history of other venous thrombosis and embolism: Secondary | ICD-10-CM | POA: Diagnosis not present

## 2021-09-27 DIAGNOSIS — Z5111 Encounter for antineoplastic chemotherapy: Secondary | ICD-10-CM

## 2021-09-27 DIAGNOSIS — I2510 Atherosclerotic heart disease of native coronary artery without angina pectoris: Secondary | ICD-10-CM | POA: Diagnosis not present

## 2021-09-27 DIAGNOSIS — T451X5A Adverse effect of antineoplastic and immunosuppressive drugs, initial encounter: Secondary | ICD-10-CM

## 2021-09-27 DIAGNOSIS — C799 Secondary malignant neoplasm of unspecified site: Secondary | ICD-10-CM

## 2021-09-27 DIAGNOSIS — Z86711 Personal history of pulmonary embolism: Secondary | ICD-10-CM

## 2021-09-27 DIAGNOSIS — Z86010 Personal history of colonic polyps: Secondary | ICD-10-CM | POA: Diagnosis not present

## 2021-09-27 DIAGNOSIS — Z800 Family history of malignant neoplasm of digestive organs: Secondary | ICD-10-CM | POA: Diagnosis not present

## 2021-09-27 DIAGNOSIS — C20 Malignant neoplasm of rectum: Secondary | ICD-10-CM | POA: Diagnosis not present

## 2021-09-27 DIAGNOSIS — Z7901 Long term (current) use of anticoagulants: Secondary | ICD-10-CM | POA: Diagnosis not present

## 2021-09-27 DIAGNOSIS — R197 Diarrhea, unspecified: Secondary | ICD-10-CM | POA: Diagnosis not present

## 2021-09-27 DIAGNOSIS — Z923 Personal history of irradiation: Secondary | ICD-10-CM | POA: Diagnosis not present

## 2021-09-27 DIAGNOSIS — C775 Secondary and unspecified malignant neoplasm of intrapelvic lymph nodes: Secondary | ICD-10-CM | POA: Diagnosis not present

## 2021-09-27 LAB — CBC WITH DIFFERENTIAL/PLATELET
Abs Immature Granulocytes: 0.02 10*3/uL (ref 0.00–0.07)
Basophils Absolute: 0 10*3/uL (ref 0.0–0.1)
Basophils Relative: 1 %
Eosinophils Absolute: 0.2 10*3/uL (ref 0.0–0.5)
Eosinophils Relative: 3 %
HCT: 32.8 % — ABNORMAL LOW (ref 36.0–46.0)
Hemoglobin: 10.2 g/dL — ABNORMAL LOW (ref 12.0–15.0)
Immature Granulocytes: 0 %
Lymphocytes Relative: 25 %
Lymphs Abs: 1.1 10*3/uL (ref 0.7–4.0)
MCH: 30.7 pg (ref 26.0–34.0)
MCHC: 31.1 g/dL (ref 30.0–36.0)
MCV: 98.8 fL (ref 80.0–100.0)
Monocytes Absolute: 0.6 10*3/uL (ref 0.1–1.0)
Monocytes Relative: 13 %
Neutro Abs: 2.7 10*3/uL (ref 1.7–7.7)
Neutrophils Relative %: 58 %
Platelets: 219 10*3/uL (ref 150–400)
RBC: 3.32 MIL/uL — ABNORMAL LOW (ref 3.87–5.11)
RDW: 18.6 % — ABNORMAL HIGH (ref 11.5–15.5)
WBC: 4.6 10*3/uL (ref 4.0–10.5)
nRBC: 0 % (ref 0.0–0.2)

## 2021-09-27 LAB — COMPREHENSIVE METABOLIC PANEL
ALT: 16 U/L (ref 0–44)
AST: 24 U/L (ref 15–41)
Albumin: 3.7 g/dL (ref 3.5–5.0)
Alkaline Phosphatase: 53 U/L (ref 38–126)
Anion gap: 8 (ref 5–15)
BUN: 16 mg/dL (ref 8–23)
CO2: 26 mmol/L (ref 22–32)
Calcium: 9.3 mg/dL (ref 8.9–10.3)
Chloride: 105 mmol/L (ref 98–111)
Creatinine, Ser: 0.89 mg/dL (ref 0.44–1.00)
GFR, Estimated: >60 mL/min (ref >60)
Glucose, Bld: 96 mg/dL (ref 70–99)
Potassium: 4 mmol/L (ref 3.5–5.1)
Sodium: 139 mmol/L (ref 135–145)
Total Bilirubin: 0.5 mg/dL (ref 0.3–1.2)
Total Protein: 6.5 g/dL (ref 6.5–8.1)

## 2021-09-27 LAB — PROTEIN, URINE, RANDOM: Total Protein, Urine: 17 mg/dL

## 2021-09-27 MED ORDER — SODIUM CHLORIDE 0.9 % IV SOLN
10.0000 mg | Freq: Once | INTRAVENOUS | Status: AC
  Administered 2021-09-27: 10 mg via INTRAVENOUS
  Filled 2021-09-27: qty 10

## 2021-09-27 MED ORDER — SODIUM CHLORIDE 0.9 % IV SOLN
2400.0000 mg/m2 | INTRAVENOUS | Status: DC
  Administered 2021-09-27: 4300 mg via INTRAVENOUS
  Filled 2021-09-27: qty 86

## 2021-09-27 MED ORDER — DIPHENHYDRAMINE HCL 50 MG/ML IJ SOLN: 25.0000 mg | Freq: Every day | INTRAMUSCULAR | Status: DC | PRN

## 2021-09-27 MED ORDER — SODIUM CHLORIDE 0.9 % IV SOLN
Freq: Once | INTRAVENOUS | Status: AC
  Filled 2021-09-27: qty 250

## 2021-09-27 MED ORDER — SODIUM CHLORIDE 0.9 % IV SOLN
350.0000 mg | Freq: Once | INTRAVENOUS | Status: AC
  Administered 2021-09-27: 350 mg via INTRAVENOUS
  Filled 2021-09-27: qty 14

## 2021-09-27 MED ORDER — SODIUM CHLORIDE 0.9 % IV SOLN
750.0000 mg | Freq: Once | INTRAVENOUS | Status: AC
  Administered 2021-09-27: 750 mg via INTRAVENOUS
  Filled 2021-09-27: qty 37.5

## 2021-09-27 NOTE — Progress Notes (Signed)
Pt here for follow up. No new concerns voiced.   

## 2021-09-27 NOTE — Patient Instructions (Signed)
[facility] CANCER CTR AT [place]  Discharge Instructions: Thank you for choosing [facility] to provide your oncology and hematology care.  If you have a lab appointment with the [facility], please go directly to the [facility] and check in at the registration area.  Wear comfortable clothing and clothing appropriate for easy access to any Portacath or PICC line.   We strive to give you quality time with your provider. You may need to reschedule your appointment if you arrive late (15 or more minutes).  Arriving late affects you and other patients whose appointments are after yours.  Also, if you miss three or more appointments without notifying the office, you may be dismissed from the clinic at the providers discretion.      For prescription refill requests, have your pharmacy contact our office and allow 72 hours for refills to be completed.    Today you received the following chemotherapy and/or immunotherapy agents MVASI, LEUCOVORIN, 5 FU      To help prevent nausea and vomiting after your treatment, we encourage you to take your nausea medication as directed.  BELOW ARE SYMPTOMS THAT SHOULD BE REPORTED IMMEDIATELY: *FEVER GREATER THAN 100.4 F (38 C) OR HIGHER *CHILLS OR SWEATING *NAUSEA AND VOMITING THAT IS NOT CONTROLLED WITH YOUR NAUSEA MEDICATION *UNUSUAL SHORTNESS OF BREATH *UNUSUAL BRUISING OR BLEEDING *URINARY PROBLEMS (pain or burning when urinating, or frequent urination) *BOWEL PROBLEMS (unusual diarrhea, constipation, pain near the anus) TENDERNESS IN MOUTH AND THROAT WITH OR WITHOUT PRESENCE OF ULCERS (sore throat, sores in mouth, or a toothache) UNUSUAL RASH, SWELLING OR PAIN  UNUSUAL VAGINAL DISCHARGE OR ITCHING   Items with * indicate a potential emergency and should be followed up as soon as possible or go to the Emergency Department if any problems should occur.  Please show the CHEMOTHERAPY ALERT CARD or IMMUNOTHERAPY ALERT CARD at  check-in to the Emergency Department and triage nurse.  Should you have questions after your visit or need to cancel or reschedule your appointment, please contact [facility] CANCER [place] AT [place]  [phone] and follow the prompts.  Office hours are 8:00 a.m. to 4:30 p.m. Monday - Friday. Please note that voicemails left after 4:00 p.m. may not be returned until the following business day.  We are closed weekends and major holidays. You have access to a nurse at all times for urgent questions. Please call the main number to the clinic [phone] and follow the prompts.  For any non-urgent questions, you may also contact your provider using MyChart. We now offer e-Visits for anyone 73 and older to request care online for non-urgent symptoms. For details visit [email or web address].   Also download the MyChart app! Go to the app store, search "MyChart", open the app, select [facility], and log in with your MyChart username and password.  Due to Covid, a mask is required upon entering the hospital/clinic. If you do not have a mask, one will be given to you upon arrival. For doctor visits, patients may have 1 support person aged 44 or older with them. For treatment visits, patients cannot have anyone with them due to current Covid guidelines and our immunocompromised population.   Bevacizumab injection What is this medication? BEVACIZUMAB (be va SIZ yoo mab) is a monoclonal antibody. It is used to treat many types of cancer. This medicine may be used for other purposes; ask your health care provider or pharmacist if you have questions. COMMON BRAND NAME(S): Alymsys, Avastin, MVASI, Zirabev What  should I tell my care team before I take this medication? They need to know if you have any of these conditions: diabetes heart disease high blood pressure history of coughing up blood prior anthracycline chemotherapy (e.g., doxorubicin, daunorubicin, epirubicin) recent or ongoing  radiation therapy recent or planning to have surgery stroke an unusual or allergic reaction to bevacizumab, hamster proteins, mouse proteins, other medicines, foods, dyes, or preservatives pregnant or trying to get pregnant breast-feeding How should I use this medication? This medicine is for infusion into a vein. It is given by a health care professional in a hospital or clinic setting. Talk to your pediatrician regarding the use of this medicine in children. Special care may be needed. Overdosage: If you think you have taken too much of this medicine contact a poison control center or emergency room at once. NOTE: This medicine is only for you. Do not share this medicine with others. What if I miss a dose? It is important not to miss your dose. Call your doctor or health care professional if you are unable to keep an appointment. What may interact with this medication? Interactions are not expected. This list may not describe all possible interactions. Give your health care provider a list of all the medicines, herbs, non-prescription drugs, or dietary supplements you use. Also tell them if you smoke, drink alcohol, or use illegal drugs. Some items may interact with your medicine. What should I watch for while using this medication? Your condition will be monitored carefully while you are receiving this medicine. You will need important blood work and urine testing done while you are taking this medicine. This medicine may increase your risk to bruise or bleed. Call your doctor or health care professional if you notice any unusual bleeding. Before having surgery, talk to your health care provider to make sure it is ok. This drug can increase the risk of poor healing of your surgical site or wound. You will need to stop this drug for 28 days before surgery. After surgery, wait at least 28 days before restarting this drug. Make sure the surgical site or wound is healed enough before restarting  this drug. Talk to your health care provider if questions. Do not become pregnant while taking this medicine or for 6 months after stopping it. Women should inform their doctor if they wish to become pregnant or think they might be pregnant. There is a potential for serious side effects to an unborn child. Talk to your health care professional or pharmacist for more information. Do not breast-feed an infant while taking this medicine and for 6 months after the last dose. This medicine has caused ovarian failure in some women. This medicine may interfere with the ability to have a child. You should talk to your doctor or health care professional if you are concerned about your fertility. What side effects may I notice from receiving this medication? Side effects that you should report to your doctor or health care professional as soon as possible: allergic reactions like skin rash, itching or hives, swelling of the face, lips, or tongue chest pain or chest tightness chills coughing up blood high fever seizures severe constipation signs and symptoms of bleeding such as bloody or black, tarry stools; red or dark-brown urine; spitting up blood or brown material that looks like coffee grounds; red spots on the skin; unusual bruising or bleeding from the eye, gums, or nose signs and symptoms of a blood clot such as breathing problems; chest pain; severe,  sudden headache; pain, swelling, warmth in the leg signs and symptoms of a stroke like changes in vision; confusion; trouble speaking or understanding; severe headaches; sudden numbness or weakness of the face, arm or leg; trouble walking; dizziness; loss of balance or coordination stomach pain sweating swelling of legs or ankles vomiting weight gain Side effects that usually do not require medical attention (report to your doctor or health care professional if they continue or are bothersome): back pain changes in taste decreased appetite dry  skin nausea tiredness This list may not describe all possible side effects. Call your doctor for medical advice about side effects. You may report side effects to FDA at 1-800-FDA-1088. Where should I keep my medication? This drug is given in a hospital or clinic and will not be stored at home. NOTE: This sheet is a summary. It may not cover all possible information. If you have questions about this medicine, talk to your doctor, pharmacist, or health care provider.  2022 Elsevier/Gold Standard (2021-06-21 00:00:00)   Leucovorin injection What is this medication? LEUCOVORIN (loo koe VOR in) is used to prevent or treat the harmful effects of some medicines. This medicine is used to treat anemia caused by a low amount of folic acid in the body. It is also used with 5-fluorouracil (5-FU) to treat colon cancer. This medicine may be used for other purposes; ask your health care provider or pharmacist if you have questions. What should I tell my care team before I take this medication? They need to know if you have any of these conditions: anemia from low levels of vitamin B-12 in the blood an unusual or allergic reaction to leucovorin, folic acid, other medicines, foods, dyes, or preservatives pregnant or trying to get pregnant breast-feeding How should I use this medication? This medicine is for injection into a muscle or into a vein. It is given by a health care professional in a hospital or clinic setting. Talk to your pediatrician regarding the use of this medicine in children. Special care may be needed. Overdosage: If you think you have taken too much of this medicine contact a poison control center or emergency room at once. NOTE: This medicine is only for you. Do not share this medicine with others. What if I miss a dose? This does not apply. What may interact with this medication? capecitabine fluorouracil phenobarbital phenytoin primidone trimethoprim-sulfamethoxazole This  list may not describe all possible interactions. Give your health care provider a list of all the medicines, herbs, non-prescription drugs, or dietary supplements you use. Also tell them if you smoke, drink alcohol, or use illegal drugs. Some items may interact with your medicine. What should I watch for while using this medication? Your condition will be monitored carefully while you are receiving this medicine. This medicine may increase the side effects of 5-fluorouracil, 5-FU. Tell your doctor or health care professional if you have diarrhea or mouth sores that do not get better or that get worse. What side effects may I notice from receiving this medication? Side effects that you should report to your doctor or health care professional as soon as possible: allergic reactions like skin rash, itching or hives, swelling of the face, lips, or tongue breathing problems fever, infection mouth sores unusual bleeding or bruising unusually weak or tired Side effects that usually do not require medical attention (report to your doctor or health care professional if they continue or are bothersome): constipation or diarrhea loss of appetite nausea, vomiting This list may not  describe all possible side effects. Call your doctor for medical advice about side effects. You may report side effects to FDA at 1-800-FDA-1088. Where should I keep my medication? This drug is given in a hospital or clinic and will not be stored at home. NOTE: This sheet is a summary. It may not cover all possible information. If you have questions about this medicine, talk to your doctor, pharmacist, or health care provider.  2022 Elsevier/Gold Standard (2008-04-09 00:00:00)  Fluorouracil, 5-FU injection What is this medication? FLUOROURACIL, 5-FU (flure oh YOOR a sil) is a chemotherapy drug. It slows the growth of cancer cells. This medicine is used to treat many types of cancer like breast cancer, colon or rectal cancer,  pancreatic cancer, and stomach cancer. This medicine may be used for other purposes; ask your health care provider or pharmacist if you have questions. COMMON BRAND NAME(S): Adrucil What should I tell my care team before I take this medication? They need to know if you have any of these conditions: blood disorders dihydropyrimidine dehydrogenase (DPD) deficiency infection (especially a virus infection such as chickenpox, cold sores, or herpes) kidney disease liver disease malnourished, poor nutrition recent or ongoing radiation therapy an unusual or allergic reaction to fluorouracil, other chemotherapy, other medicines, foods, dyes, or preservatives pregnant or trying to get pregnant breast-feeding How should I use this medication? This drug is given as an infusion or injection into a vein. It is administered in a hospital or clinic by a specially trained health care professional. Talk to your pediatrician regarding the use of this medicine in children. Special care may be needed. Overdosage: If you think you have taken too much of this medicine contact a poison control center or emergency room at once. NOTE: This medicine is only for you. Do not share this medicine with others. What if I miss a dose? It is important not to miss your dose. Call your doctor or health care professional if you are unable to keep an appointment. What may interact with this medication? Do not take this medicine with any of the following medications: live virus vaccines This medicine may also interact with the following medications: medicines that treat or prevent blood clots like warfarin, enoxaparin, and dalteparin This list may not describe all possible interactions. Give your health care provider a list of all the medicines, herbs, non-prescription drugs, or dietary supplements you use. Also tell them if you smoke, drink alcohol, or use illegal drugs. Some items may interact with your medicine. What should  I watch for while using this medication? Visit your doctor for checks on your progress. This drug may make you feel generally unwell. This is not uncommon, as chemotherapy can affect healthy cells as well as cancer cells. Report any side effects. Continue your course of treatment even though you feel ill unless your doctor tells you to stop. In some cases, you may be given additional medicines to help with side effects. Follow all directions for their use. Call your doctor or health care professional for advice if you get a fever, chills or sore throat, or other symptoms of a cold or flu. Do not treat yourself. This drug decreases your body's ability to fight infections. Try to avoid being around people who are sick. This medicine may increase your risk to bruise or bleed. Call your doctor or health care professional if you notice any unusual bleeding. Be careful brushing and flossing your teeth or using a toothpick because you may get an infection or  bleed more easily. If you have any dental work done, tell your dentist you are receiving this medicine. Avoid taking products that contain aspirin, acetaminophen, ibuprofen, naproxen, or ketoprofen unless instructed by your doctor. These medicines may hide a fever. Do not become pregnant while taking this medicine. Women should inform their doctor if they wish to become pregnant or think they might be pregnant. There is a potential for serious side effects to an unborn child. Talk to your health care professional or pharmacist for more information. Do not breast-feed an infant while taking this medicine. Men should inform their doctor if they wish to father a child. This medicine may lower sperm counts. Do not treat diarrhea with over the counter products. Contact your doctor if you have diarrhea that lasts more than 2 days or if it is severe and watery. This medicine can make you more sensitive to the sun. Keep out of the sun. If you cannot avoid being in  the sun, wear protective clothing and use sunscreen. Do not use sun lamps or tanning beds/booths. What side effects may I notice from receiving this medication? Side effects that you should report to your doctor or health care professional as soon as possible: allergic reactions like skin rash, itching or hives, swelling of the face, lips, or tongue low blood counts - this medicine may decrease the number of white blood cells, red blood cells and platelets. You may be at increased risk for infections and bleeding. signs of infection - fever or chills, cough, sore throat, pain or difficulty passing urine signs of decreased platelets or bleeding - bruising, pinpoint red spots on the skin, black, tarry stools, blood in the urine signs of decreased red blood cells - unusually weak or tired, fainting spells, lightheadedness breathing problems changes in vision chest pain mouth sores nausea and vomiting pain, swelling, redness at site where injected pain, tingling, numbness in the hands or feet redness, swelling, or sores on hands or feet stomach pain unusual bleeding Side effects that usually do not require medical attention (report to your doctor or health care professional if they continue or are bothersome): changes in finger or toe nails diarrhea dry or itchy skin hair loss headache loss of appetite sensitivity of eyes to the light stomach upset unusually teary eyes This list may not describe all possible side effects. Call your doctor for medical advice about side effects. You may report side effects to FDA at 1-800-FDA-1088. Where should I keep my medication? This drug is given in a hospital or clinic and will not be stored at home. NOTE: This sheet is a summary. It may not cover all possible information. If you have questions about this medicine, talk to your doctor, pharmacist, or health care provider.  2022 Elsevier/Gold Standard (2021-06-21 00:00:00)

## 2021-09-27 NOTE — Progress Notes (Signed)
Hematology/Oncology progress note [phone] [phone]   Patient Care Team: Olin Hauser, DO as PCP - General (Family Medicine) Clent Jacks, RN as Registered Nurse Earlie Server, MD as Consulting Physician (Hematology and Oncology)  CHIEF COMPLAINTS/REASON FOR VISIT:  Follow up for rectal cancer  HISTORY OF PRESENTING ILLNESS:  Patient initially presented with complaints of postmenopausal bleeding on 08/16/2018.  History of was menopausal vaginal bleeding in 2016 which resulted in cervical polypectomy.  Pathology 02/04/2015 showed cervical polyp, consistent with benign endometrial polyp.  [name] after polypectomy due to anxiety associated with pelvic exams.  pelvic exam on 08/16/2018 reviewed cervical abnormality and from enlarged uterus. Seen by Dr. Marcelline Mates on 10/29/2018.  Endometrial biopsy and a Pap smear was performed. 10/29/2018 Pap smear showed adenocarcinoma, favor endometrial origin. 10/29/2018 endometrial biopsy showed endometrioid carcinoma, FIGO grade 1.  10/29/2018- TA & TV Ultrasound revealed: Anteverted uterus measuring 8.7 x 5.6 x 6.4 cm without evidence of focal masses.  The endometrium measuring 24.1 mm (thickened) and heterogeneous.  Right and left ovaries not visualized.  No adnexal masses identified.  No free fluid in cul-de-sac.  Patient was seen by Dr. Theora Gianotti in clinic on 11/13/2018.  Cervical exam reveals 2 cm exophytic irregular mass consistent with malignancy.   11/19/2018 CT chest abdomen pelvis with contrast showed thickened endometrium with some irregularity compatible with the provided diagnosis of endometrial malignancy.  There is a mildly prominent left inguinal node 1.4 cm.  Patient was seen by Dr. Fransisca Connors on 11/20/2018 and left groin lymph node biopsy was recommended.  11/26/2018 patient underwent left inguinal lymph node biopsy. Pathology showed metastatic adenocarcinoma consistent with colorectal origin.  CDX  2+.  Case was discussed on tumor board.  Recommend colonoscopy for further evaluation.  Patient reports significant weight loss 30 pounds over the last year.  Chronic vaginal spotting. Change of bowel habits the past few months.  More constipated.  Family history positive for brother who has colon cancer prostate cancer.  patient has underwent colonoscopy on 12/03/2018 which reviewed a nonobstructing large mass in the rectum.  Also chronic fistula.  Mass was not circumferential.  This was biopsied with a cold forceps for histology.  Pathology came back hyperplastic polyp negative for dysplasia and malignancy. [name], patient underwent flex sigmoidoscopy on 12/06/2018 with rebiopsy of the rectal mass. This time biopsy results came back positive for invasive colorectal adenocarcinoma, moderately differentiated. Immunotherapy for nearly mismatch repair protein (MMR ) was performed.  There is no loss of MMR expression.  low probability of MSI high.   # Seen by [name] for evaluation of resectability for rectal cancer. In addition, she also had a second opinion with [facility] where her endometrial biopsy pathology was changed to  adenocarcinoma, consistent with colorectal primary.   [name]. She has home health that has been assisting with ostomy care  Patient was also evaluated by [facility] oncology.  Recommendation is to proceed with TNT with concurrent chemoradiation followed by neoadjuvant chemotherapy followed by surgical resection. Patient prefers to have treatment done locally with [facility].   # Oncology Treatment:  02/03/2019- 03/19/2019  concurrent Xeloda and radiation.  Xeloda dose 816m /m2 BID - rounded to 1652mBID- on days of radiation. 04/09/2019, started on FOLFOX with bolus early.  Omitted.  07/16/2019 finished 8 cycles of FOLFOX. 09/17/19 APR/posterior vaginectomy/TAH/BSO/VY-flap pT4b pN0 with close vaginal margin 0.2  mm.  Uterus and ovaries negative for malignancy. Patient  reports bilateral lower extremity numbness and tingling, intermittent, left worse than right. She has lost a lot of weight since her APR surgery.   #Family history with half brother having's history of colon cancer prostate cancer.  Personal history of colorectal cancer.  Patient has not decided if she wants genetic testing.    # history of PE( 01/13/2020)  in the bilateral lower extremity DVT (01/13/2020).   She finishes 6 months of anticoagulation with Eliquis 5 mg twice daily. Now switched to Eliquis 2.5 mg twice daily..  # She has now developed recurrent disease. #06/30/20  vaginal introitus mass biopsied. Pathology is consistent with metastatic colorectal adenocarcinoma I have discussed with [name]  Dr. Hester Mates and the mass is not resectable. Patient has also had colonoscopy by Dr. Vicente Males yesterday. Normal examination. # 07/16/2020 cycle 1 FOLFIRI  # 07/20/2020 PET scan was done for further evaluation, images are consistent with local recurrence, no distant metastasis. #Discussed with radiation oncology Dr. Baruch Gouty will recommends concurrent chemotherapy and radiation. 08/02/2020-08/16/2020, patient starts radiation.  Xeloda was held due to neutropenia 08/17/2020,-09/06/2020 Xeloda 1500 mg twice daily concurrently with radiation  01/31/21 started on FOLFIRI + Bev 05/18/2021 CT chest abdomen pelvis showed Previously noted enlargement of bilateral inguinal lymph nodes is resolved, consistent with treatment response of nodal metastatic disease. Interval decrease in size of multiple small bilateral pulmonary nodules, consistent with treatment response of pulmonary metastatic disease. No evidence of new metastatic disease. 05/24/2021 - 08/30/2021, continued on FOLFIRI plus bevacizumab.  Irinotecan dose was reduced, eventually 100mg /m2  09/02/2021, CT chest abdomen pelvis without contrast Showed small bilateral pulmonary nodules, unchanged.   Stable metastatic disease.  No noncontrast evidence of new metastatic disease in the chest abdomen pelvis.  Small parastomal hernia.  Enlargement of main pulmonary artery.  Coronary artery disease.  09/13/2021, maintenance 5-FU/bevacizumab   INTERVAL HISTORY Jacqueline Yoder is a 75 y.o. female who has above history reviewed by me presents for follow-up of rectal cancer. She tolerates chemotherapy well, diarrhea is manageable.  She takes antidiarrhea medications with relief of symptoms.  Appetite is good.  She lost weight. Review of Systems  Constitutional:  Positive for fatigue. Negative for appetite change, chills, fever and unexpected weight change.  HENT:   Negative for hearing loss and voice change.   Eyes:  Negative for eye problems.  Respiratory:  Negative for chest tightness and cough.   Cardiovascular:  Negative for chest pain.  Gastrointestinal:  Positive for diarrhea. Negative for abdominal distention, abdominal pain, blood in stool, constipation and nausea.  Endocrine: Negative for hot flashes.  Genitourinary:  Negative for difficulty urinating and frequency.   Musculoskeletal:  Negative for arthralgias.  Skin:  Negative for itching and rash.  Neurological:  Negative for extremity weakness and numbness.  Hematological:  Negative for adenopathy.  Psychiatric/Behavioral:  Negative for confusion.    MEDICAL HISTORY:  Past Medical History:  Diagnosis Date   Allergy    Arthritis    Blood clot in vein    Family history of colon cancer    GERD (gastroesophageal reflux disease)    Hypercholesteremia    Hypertension    Hypertension    Lower extremity edema    Rectal cancer ([place]) 12/2018   Urinary incontinence     SURGICAL HISTORY: Past Surgical History:  Procedure Laterality Date   ABDOMINAL HYSTERECTOMY     CHOLECYSTECTOMY  1971   COLONOSCOPY WITH PROPOFOL N/A 12/03/2018   Procedure: COLONOSCOPY WITH PROPOFOL;  Surgeon: Lucilla Lame, MD;  Location: [facility] ENDOSCOPY;   Service: Endoscopy;  Laterality: N/A;   COLONOSCOPY WITH PROPOFOL N/A 07/15/2020   Procedure: COLONOSCOPY WITH PROPOFOL;  Surgeon: [name], MD;  Location: [facility] ENDOSCOPY;  Service: Gastroenterology;  Laterality: N/A;   FLEXIBLE SIGMOIDOSCOPY N/A 12/06/2018   Procedure: FLEXIBLE SIGMOIDOSCOPY;  Surgeon: [name], MD;  Location: [facility] ENDOSCOPY;  Service: Endoscopy;  Laterality: N/A;   LAPAROSCOPIC COLOSTOMY  01/06/2019   PORTACATH PLACEMENT N/A 04/03/2019   Procedure: INSERTION PORT-A-CATH;  Surgeon: Jules Husbands, MD;  Location: [facility] ORS;  Service: General;  Laterality: N/A;    SOCIAL HISTORY: Social History   Socioeconomic History   Marital status: Widowed    Spouse name: Not on file   Number of children: Not on file   Years of education: Not on file   Highest education level: Not on file  Occupational History   Not on file  Tobacco Use   Smoking status: Former    Types: Cigarettes    Quit date: 12/02/1977    Years since quitting: 43.8   Smokeless tobacco: Former  Scientific laboratory technician Use: Never used  Substance and Sexual Activity   Alcohol use: Never   Drug use: Never   Sexual activity: Not Currently    Birth control/protection: None  Other Topics Concern   Not on file  Social History Narrative   Lives with daughter   Social Determinants of Health   Financial Resource Strain: Low Risk    Difficulty of Paying Living Expenses: Not hard at all  Food Insecurity: No Food Insecurity   Worried About Charity fundraiser in the Last Year: Never true   [place] in the Last Year: Never true  Transportation Needs: No Transportation Needs   Lack of Transportation (Medical): No   Lack of Transportation (Non-Medical): No  Physical Activity: Inactive   Days of Exercise per Week: 0 days   Minutes of Exercise per Session: 0 min  Stress: No Stress Concern Present   Feeling of Stress : Not at all  Social Connections: Not on file  Intimate Partner Violence: Not on file     FAMILY HISTORY: Family History  Problem Relation Age of Onset   Colon cancer Brother 64       exposure to chemicals [place]   Hypertension Mother    Stroke Mother    Kidney failure Father    Breast cancer Neg Hx    Ovarian cancer Neg Hx     ALLERGIES:  is allergic to sulfamethoxazole-trimethoprim.  MEDICATIONS:  Current Outpatient Medications  Medication Sig Dispense Refill   apixaban (ELIQUIS) 2.5 MG TABS tablet Take 1 tablet (2.5 mg total) by mouth 2 (two) times daily. 60 tablet 2   Cholecalciferol (VITAMIN D3) 2000 units capsule Take 2,000 Units by mouth daily.     diclofenac sodium (VOLTAREN) 1 % GEL Apply 2 g topically 4 (four) times daily as needed (joint pain).  11   fluticasone (FLONASE) 50 MCG/ACT nasal spray Use 1 spray(s) in each nostril once daily 16 g 1   gabapentin (NEURONTIN) 100 MG capsule Take 1 capsule (100 mg total) by mouth at bedtime. 30 capsule 0   ipratropium (ATROVENT) 0.03 % nasal spray Place 1 spray into both nostrils 2 (two) times daily. 30 mL 2   lidocaine-prilocaine (EMLA) cream Apply 1 application topically as needed. 30 g 6   loratadine (CLARITIN) 10 MG tablet Take 10 mg by mouth daily.     Multiple Vitamins-Minerals (ONE-A-DAY  WOMENS 50 PLUS PO) Take 1 tablet by mouth daily.      ondansetron (ZOFRAN) 8 MG tablet Take 1 tablet (8 mg total) by mouth 2 (two) times daily as needed for refractory nausea / vomiting. Start on day 3 after chemotherapy. 30 tablet 1   potassium chloride SA (KLOR-CON M) 20 MEQ tablet Take 2 tablets (40 mEq total) by mouth 2 (two) times daily. 120 tablet 1   prochlorperazine (COMPAZINE) 10 MG tablet Take 1 tablet (10 mg total) by mouth every 6 (six) hours as needed (NAUSEA). 30 tablet 1   simvastatin (ZOCOR) 40 MG tablet Take 1 tablet (40 mg total) by mouth at bedtime. 90 tablet 3   triamterene-hydrochlorothiazide (DYAZIDE) 37.5-25 MG capsule Take 1 each (1 capsule total) by mouth daily. 90 capsule 3   diphenoxylate-atropine  (LOMOTIL) 2.5-0.025 MG tablet Take 1 tablet by mouth 4 (four) times daily as needed for diarrhea or loose stools. (Patient not taking: Reported on 09/27/2021) 30 tablet 1   loperamide (IMODIUM) 2 MG capsule Take 1 capsule (2 mg total) by mouth See admin instructions. With onset of loose stool, take $RemoveBef'4mg'PAASoxSGfY$  followed by $RemoveBef'2mg'DXGMKCJcom$  every 2 hours,  Maximum: 16 mg/day (Patient not taking: Reported on 09/27/2021) 120 capsule 1   zinc gluconate 50 MG tablet Take 50 mg by mouth daily.     No current facility-administered medications for this visit.     PHYSICAL EXAMINATION: ECOG PERFORMANCE STATUS: 1 - Symptomatic but completely ambulatory  Physical Exam Constitutional:      General: She is not in acute distress. HENT:     Head: Normocephalic and atraumatic.  Eyes:     General: No scleral icterus. Cardiovascular:     Rate and Rhythm: Normal rate and regular rhythm.     Heart sounds: Normal heart sounds.  Pulmonary:     Effort: Pulmonary effort is normal. No respiratory distress.     Breath sounds: No wheezing.  Abdominal:     General: Bowel sounds are normal. There is no distension.     Palpations: Abdomen is soft.     Comments: + Colostomy bag   Musculoskeletal:        General: No deformity. Normal range of motion.     Cervical back: Normal range of motion and neck supple.  Skin:    General: Skin is warm and dry.     Findings: No erythema or rash.  Neurological:     Mental Status: She is alert and oriented to person, place, and time. Mental status is at baseline.     Cranial Nerves: No cranial nerve deficit.     Coordination: Coordination normal.      LABORATORY DATA:  I have reviewed the data as listed Lab Results  Component Value Date   WBC 4.6 09/27/2021   HGB 10.2 (L) 09/27/2021   HCT 32.8 (L) 09/27/2021   MCV 98.8 09/27/2021   PLT 219 09/27/2021   Recent Labs    08/30/21 0822 09/13/21 0821 09/27/21 0844  NA 133* 137 139  K 3.6 3.9 4.0  CL 100 105 105  CO2 $Re'22 26 26   'xlP$ GLUCOSE 115* 103* 96  BUN $Re'10 11 16  'rpA$ CREATININE 1.03* 1.08* 0.89  CALCIUM 9.3 9.0 9.3  GFRNONAA 57* 54* >60  PROT 7.1 6.5 6.5  ALBUMIN 4.0 4.1 3.7  AST $Re'29 24 24  'jmt$ ALT 36 22 16  ALKPHOS 110 107 53  BILITOT 0.3 0.3 0.5    Iron/TIBC/Ferritin/ %Sat    Component Value  Date/Time   IRON 40 06/23/2020 1124   TIBC 246 (L) 06/23/2020 1124   FERRITIN 338 (H) 06/23/2020 1124   IRONPCTSAT 16 06/23/2020 1124     RADIOGRAPHIC STUDIES: I have personally reviewed the radiological images as listed and agreed with the findings in the report. CT CHEST ABDOMEN PELVIS WO CONTRAST  Result Date: 09/02/2021 CLINICAL DATA:  Metastatic rectal cancer, ongoing chemotherapy EXAM: CT CHEST, ABDOMEN AND PELVIS WITHOUT CONTRAST TECHNIQUE: Multidetector CT imaging of the chest, abdomen and pelvis was performed following the standard protocol without IV contrast. COMPARISON:  05/18/2021 FINDINGS: CT CHEST FINDINGS Cardiovascular: Right chest port catheter. Aortic atherosclerosis. Normal heart size. Left and right coronary artery calcifications. No pericardial effusion. Enlargement of the main pulmonary artery, measuring up to 3.7 cm in caliber. Mediastinum/Nodes: No enlarged mediastinal, hilar, or axillary lymph nodes. Thyroid gland, trachea, and esophagus demonstrate no significant findings. Lungs/Pleura: Multiple small bilateral pulmonary nodules are not significantly changed, for example a 0.5 cm nodule of the medial segment right middle lobe (series 3, image 103) and a 0.3 cm nodule of the posterior right upper lobe (series 3, image 3). No pleural effusion or pneumothorax. Musculoskeletal: No chest wall mass or suspicious bone lesions identified. CT ABDOMEN PELVIS FINDINGS Hepatobiliary: No focal liver abnormality is seen. Status post cholecystectomy. Postoperative biliary dilatation. Pancreas: Unremarkable. No pancreatic ductal dilatation or surrounding inflammatory changes. Spleen: Normal in size without significant  abnormality. Adrenals/Urinary Tract: Adrenal glands are unremarkable. Multiple bilateral renal cysts. Kidneys are otherwise normal, without renal calculi, solid lesion, or hydronephrosis. Bladder is unremarkable. Stomach/Bowel: Stomach is within normal limits. Appendix appears normal. No evidence of bowel wall thickening, distention, or inflammatory changes. Status post abdominoperineal resection and left lower quadrant end colostomy. Small parastomal hernia. Diverticulosis of the remnant sigmoid colon. Vascular/Lymphatic: Aortic atherosclerosis. No enlarged abdominal or pelvic lymph nodes. Reproductive: Status post hysterectomy. Other: No abdominal wall hernia or abnormality. No abdominopelvic ascites. Musculoskeletal: No acute or significant osseous findings. IMPRESSION: 1. Multiple small bilateral pulmonary nodules are not significantly changed, consistent with stable metastatic disease. 2. No noncontrast CT evidence of new metastatic disease in the chest, abdomen or pelvis. 3. Status post abdominoperineal resection and left lower quadrant end colostomy. Small parastomal hernia. 4. Enlargement of the main pulmonary artery, as can be seen in pulmonary hypertension. 5. Coronary artery disease. Aortic Atherosclerosis (ICD10-I70.0). Electronically Signed   By: Delanna Ahmadi M.D.   On: 09/02/2021 10:39      CT CHEST ABDOMEN PELVIS WO CONTRAST  Result Date: 09/02/2021 CLINICAL DATA:  Metastatic rectal cancer, ongoing chemotherapy EXAM: CT CHEST, ABDOMEN AND PELVIS WITHOUT CONTRAST TECHNIQUE: Multidetector CT imaging of the chest, abdomen and pelvis was performed following the standard protocol without IV contrast. COMPARISON:  05/18/2021 FINDINGS: CT CHEST FINDINGS Cardiovascular: Right chest port catheter. Aortic atherosclerosis. Normal heart size. Left and right coronary artery calcifications. No pericardial effusion. Enlargement of the main pulmonary artery, measuring up to 3.7 cm in caliber.  Mediastinum/Nodes: No enlarged mediastinal, hilar, or axillary lymph nodes. Thyroid gland, trachea, and esophagus demonstrate no significant findings. Lungs/Pleura: Multiple small bilateral pulmonary nodules are not significantly changed, for example a 0.5 cm nodule of the medial segment right middle lobe (series 3, image 103) and a 0.3 cm nodule of the posterior right upper lobe (series 3, image 3). No pleural effusion or pneumothorax. Musculoskeletal: No chest wall mass or suspicious bone lesions identified. CT ABDOMEN PELVIS FINDINGS Hepatobiliary: No focal liver abnormality is seen. Status post cholecystectomy. Postoperative biliary dilatation. Pancreas:  Unremarkable. No pancreatic ductal dilatation or surrounding inflammatory changes. Spleen: Normal in size without significant abnormality. Adrenals/Urinary Tract: Adrenal glands are unremarkable. Multiple bilateral renal cysts. Kidneys are otherwise normal, without renal calculi, solid lesion, or hydronephrosis. Bladder is unremarkable. Stomach/Bowel: Stomach is within normal limits. Appendix appears normal. No evidence of bowel wall thickening, distention, or inflammatory changes. Status post abdominoperineal resection and left lower quadrant end colostomy. Small parastomal hernia. Diverticulosis of the remnant sigmoid colon. Vascular/Lymphatic: Aortic atherosclerosis. No enlarged abdominal or pelvic lymph nodes. Reproductive: Status post hysterectomy. Other: No abdominal wall hernia or abnormality. No abdominopelvic ascites. Musculoskeletal: No acute or significant osseous findings. IMPRESSION: 1. Multiple small bilateral pulmonary nodules are not significantly changed, consistent with stable metastatic disease. 2. No noncontrast CT evidence of new metastatic disease in the chest, abdomen or pelvis. 3. Status post abdominoperineal resection and left lower quadrant end colostomy. Small parastomal hernia. 4. Enlargement of the main pulmonary artery, as can be  seen in pulmonary hypertension. 5. Coronary artery disease. Aortic Atherosclerosis (ICD10-I70.0). Electronically Signed   By: Delanna Ahmadi M.D.   On: 09/02/2021 10:39     ASSESSMENT & PLAN:  1. Metastatic adenocarcinoma ([place])   2. Rectal cancer ([place])   3. Encounter for antineoplastic chemotherapy   4. Chemotherapy induced diarrhea   5. History of pulmonary embolism   6. Current use of long term anticoagulation    Cancer Staging  Rectal cancer [facility]) Staging form: Colon and Rectum, AJCC 8th Edition - Clinical stage from 01/23/2019: Stage IIIC (cT4b, cN1a, cM0) - Signed by Earlie Server, MD on 01/23/2019 - Pathologic stage from 10/06/2019: Stage IIC (ypT4b, pN0, cM0) - Signed by Earlie Server, MD on 10/06/2019 - Pathologic: Stage Unknown (rpTX, pNX, cM1) - Signed by Earlie Server, MD on 01/31/2021  #History of stage IIIC Rectal cancer, s/p TNT, followed by 09/17/19 APR/posterior vaginectomy/TAH/BSO/VY-flap, pT4b pN0 with close vaginal margin 0.2 mm.  Uterus and ovaries negative for malignancy. palliative radiation to vaginal recurrence.  Finished 09/04/2020 01/19/21 recurrence with lung metastasis.-Palliative  Labs reviewed and discussed with patient.  CEA is trending down.  Proceed with 5-FU/bevacizumab. #Chemotherapy-induced diarrhea, symptoms are stable.  Continue as needed continue Imodium or lomotil as needed.Marland Kitchen    #Hypokalemia, potassium 4.0, continue potassium chloride to 20 mEq twice daily.  #History of pulmonary embolism, history of bilateral DVT Continue Eliquis 2.5 mg twice daily for anticoagulation prophylaxis.   Follow-up 2 weeks for next cycle of treatment.  Earlie Server, MD, PhD

## 2021-09-28 DIAGNOSIS — Z933 Colostomy status: Secondary | ICD-10-CM | POA: Diagnosis not present

## 2021-09-29 ENCOUNTER — Inpatient Hospital Stay: Payer: Medicare Other

## 2021-09-29 ENCOUNTER — Other Ambulatory Visit: Payer: Self-pay

## 2021-09-29 DIAGNOSIS — Z923 Personal history of irradiation: Secondary | ICD-10-CM | POA: Diagnosis not present

## 2021-09-29 DIAGNOSIS — Z5111 Encounter for antineoplastic chemotherapy: Secondary | ICD-10-CM | POA: Diagnosis not present

## 2021-09-29 DIAGNOSIS — Z86718 Personal history of other venous thrombosis and embolism: Secondary | ICD-10-CM | POA: Diagnosis not present

## 2021-09-29 DIAGNOSIS — I2510 Atherosclerotic heart disease of native coronary artery without angina pectoris: Secondary | ICD-10-CM | POA: Diagnosis not present

## 2021-09-29 DIAGNOSIS — R197 Diarrhea, unspecified: Secondary | ICD-10-CM | POA: Diagnosis not present

## 2021-09-29 DIAGNOSIS — C20 Malignant neoplasm of rectum: Secondary | ICD-10-CM | POA: Diagnosis not present

## 2021-09-29 DIAGNOSIS — Z86711 Personal history of pulmonary embolism: Secondary | ICD-10-CM | POA: Diagnosis not present

## 2021-09-29 DIAGNOSIS — C799 Secondary malignant neoplasm of unspecified site: Secondary | ICD-10-CM

## 2021-09-29 DIAGNOSIS — Z86010 Personal history of colonic polyps: Secondary | ICD-10-CM | POA: Diagnosis not present

## 2021-09-29 DIAGNOSIS — Z800 Family history of malignant neoplasm of digestive organs: Secondary | ICD-10-CM | POA: Diagnosis not present

## 2021-09-29 DIAGNOSIS — C775 Secondary and unspecified malignant neoplasm of intrapelvic lymph nodes: Secondary | ICD-10-CM | POA: Diagnosis not present

## 2021-09-29 MED ORDER — HEPARIN SOD (PORK) LOCK FLUSH 100 UNIT/ML IV SOLN
500.0000 [IU] | Freq: Once | INTRAVENOUS | Status: AC | PRN
  Administered 2021-09-29: 500 [IU]
  Filled 2021-09-29: qty 5

## 2021-09-29 MED ORDER — HEPARIN SOD (PORK) LOCK FLUSH 100 UNIT/ML IV SOLN
INTRAVENOUS | Status: AC
  Filled 2021-09-29: qty 5

## 2021-09-29 MED ORDER — SODIUM CHLORIDE 0.9% FLUSH
10.0000 mL | INTRAVENOUS | Status: DC | PRN
  Administered 2021-09-29: 10 mL
  Filled 2021-09-29: qty 10

## 2021-10-02 DIAGNOSIS — C20 Malignant neoplasm of rectum: Secondary | ICD-10-CM | POA: Diagnosis not present

## 2021-10-02 DIAGNOSIS — C799 Secondary malignant neoplasm of unspecified site: Secondary | ICD-10-CM | POA: Diagnosis not present

## 2021-10-03 ENCOUNTER — Telehealth: Payer: Self-pay

## 2021-10-03 NOTE — Telephone Encounter (Signed)
Okay to proceed  Nobie Putnam, DO [facility] 10/03/2021, 1:57 PM

## 2021-10-03 NOTE — Telephone Encounter (Signed)
Copied from [place] [id number]. Topic: Quick Communication - Home Health Verbal Orders >> Sep 30, 2021 11:08 AM Pawlus, Brayton Layman A wrote: Caller/Agency: [name]  Callback Number: [phone] option 2 Requesting: Pt discharge date to be next week

## 2021-10-04 ENCOUNTER — Telehealth: Payer: Self-pay | Admitting: Family Medicine

## 2021-10-04 DIAGNOSIS — N182 Chronic kidney disease, stage 2 (mild): Secondary | ICD-10-CM | POA: Diagnosis not present

## 2021-10-04 DIAGNOSIS — C7982 Secondary malignant neoplasm of genital organs: Secondary | ICD-10-CM | POA: Diagnosis not present

## 2021-10-04 DIAGNOSIS — E876 Hypokalemia: Secondary | ICD-10-CM | POA: Diagnosis not present

## 2021-10-04 DIAGNOSIS — Z7901 Long term (current) use of anticoagulants: Secondary | ICD-10-CM | POA: Diagnosis not present

## 2021-10-04 DIAGNOSIS — K219 Gastro-esophageal reflux disease without esophagitis: Secondary | ICD-10-CM | POA: Diagnosis not present

## 2021-10-04 DIAGNOSIS — Z87891 Personal history of nicotine dependence: Secondary | ICD-10-CM | POA: Diagnosis not present

## 2021-10-04 DIAGNOSIS — Z9181 History of falling: Secondary | ICD-10-CM | POA: Diagnosis not present

## 2021-10-04 DIAGNOSIS — M1990 Unspecified osteoarthritis, unspecified site: Secondary | ICD-10-CM | POA: Diagnosis not present

## 2021-10-04 DIAGNOSIS — Z933 Colostomy status: Secondary | ICD-10-CM | POA: Diagnosis not present

## 2021-10-04 DIAGNOSIS — L989 Disorder of the skin and subcutaneous tissue, unspecified: Secondary | ICD-10-CM | POA: Diagnosis not present

## 2021-10-04 DIAGNOSIS — E782 Mixed hyperlipidemia: Secondary | ICD-10-CM | POA: Diagnosis not present

## 2021-10-04 DIAGNOSIS — L89312 Pressure ulcer of right buttock, stage 2: Secondary | ICD-10-CM | POA: Diagnosis not present

## 2021-10-04 DIAGNOSIS — Z7951 Long term (current) use of inhaled steroids: Secondary | ICD-10-CM | POA: Diagnosis not present

## 2021-10-04 DIAGNOSIS — T451X5D Adverse effect of antineoplastic and immunosuppressive drugs, subsequent encounter: Secondary | ICD-10-CM | POA: Diagnosis not present

## 2021-10-04 DIAGNOSIS — D701 Agranulocytosis secondary to cancer chemotherapy: Secondary | ICD-10-CM | POA: Diagnosis not present

## 2021-10-04 DIAGNOSIS — C20 Malignant neoplasm of rectum: Secondary | ICD-10-CM | POA: Diagnosis not present

## 2021-10-04 DIAGNOSIS — I825Z3 Chronic embolism and thrombosis of unspecified deep veins of distal lower extremity, bilateral: Secondary | ICD-10-CM | POA: Diagnosis not present

## 2021-10-04 DIAGNOSIS — Z4800 Encounter for change or removal of nonsurgical wound dressing: Secondary | ICD-10-CM | POA: Diagnosis not present

## 2021-10-04 DIAGNOSIS — I129 Hypertensive chronic kidney disease with stage 1 through stage 4 chronic kidney disease, or unspecified chronic kidney disease: Secondary | ICD-10-CM | POA: Diagnosis not present

## 2021-10-04 NOTE — Telephone Encounter (Signed)
Jacqueline Yoder with [facility] is calling in to request verbal orders for nursing services    2 week 9 with 2 PRN for wound care   Call back:  [phone]- voicemail secure   Please assist further.

## 2021-10-05 ENCOUNTER — Other Ambulatory Visit: Payer: Self-pay | Admitting: *Deleted

## 2021-10-05 DIAGNOSIS — C20 Malignant neoplasm of rectum: Secondary | ICD-10-CM

## 2021-10-05 DIAGNOSIS — C799 Secondary malignant neoplasm of unspecified site: Secondary | ICD-10-CM

## 2021-10-05 NOTE — Telephone Encounter (Signed)
Ok to proceed  Jacqueline Yoder, [facility] 10/05/2021, 12:24 PM

## 2021-10-05 NOTE — Telephone Encounter (Signed)
Jacqueline Yoder is aware.

## 2021-10-10 DIAGNOSIS — Z7901 Long term (current) use of anticoagulants: Secondary | ICD-10-CM | POA: Diagnosis not present

## 2021-10-10 DIAGNOSIS — L89312 Pressure ulcer of right buttock, stage 2: Secondary | ICD-10-CM | POA: Diagnosis not present

## 2021-10-10 DIAGNOSIS — Z4800 Encounter for change or removal of nonsurgical wound dressing: Secondary | ICD-10-CM | POA: Diagnosis not present

## 2021-10-10 DIAGNOSIS — D701 Agranulocytosis secondary to cancer chemotherapy: Secondary | ICD-10-CM | POA: Diagnosis not present

## 2021-10-10 DIAGNOSIS — Z9181 History of falling: Secondary | ICD-10-CM | POA: Diagnosis not present

## 2021-10-10 DIAGNOSIS — E782 Mixed hyperlipidemia: Secondary | ICD-10-CM | POA: Diagnosis not present

## 2021-10-10 DIAGNOSIS — C20 Malignant neoplasm of rectum: Secondary | ICD-10-CM | POA: Diagnosis not present

## 2021-10-10 DIAGNOSIS — Z933 Colostomy status: Secondary | ICD-10-CM | POA: Diagnosis not present

## 2021-10-10 DIAGNOSIS — I825Z3 Chronic embolism and thrombosis of unspecified deep veins of distal lower extremity, bilateral: Secondary | ICD-10-CM | POA: Diagnosis not present

## 2021-10-10 DIAGNOSIS — E876 Hypokalemia: Secondary | ICD-10-CM | POA: Diagnosis not present

## 2021-10-10 DIAGNOSIS — L989 Disorder of the skin and subcutaneous tissue, unspecified: Secondary | ICD-10-CM | POA: Diagnosis not present

## 2021-10-10 DIAGNOSIS — K219 Gastro-esophageal reflux disease without esophagitis: Secondary | ICD-10-CM | POA: Diagnosis not present

## 2021-10-10 DIAGNOSIS — Z87891 Personal history of nicotine dependence: Secondary | ICD-10-CM | POA: Diagnosis not present

## 2021-10-10 DIAGNOSIS — I129 Hypertensive chronic kidney disease with stage 1 through stage 4 chronic kidney disease, or unspecified chronic kidney disease: Secondary | ICD-10-CM | POA: Diagnosis not present

## 2021-10-10 DIAGNOSIS — C7982 Secondary malignant neoplasm of genital organs: Secondary | ICD-10-CM | POA: Diagnosis not present

## 2021-10-10 DIAGNOSIS — M1990 Unspecified osteoarthritis, unspecified site: Secondary | ICD-10-CM | POA: Diagnosis not present

## 2021-10-10 DIAGNOSIS — N182 Chronic kidney disease, stage 2 (mild): Secondary | ICD-10-CM | POA: Diagnosis not present

## 2021-10-10 DIAGNOSIS — Z7951 Long term (current) use of inhaled steroids: Secondary | ICD-10-CM | POA: Diagnosis not present

## 2021-10-10 DIAGNOSIS — T451X5D Adverse effect of antineoplastic and immunosuppressive drugs, subsequent encounter: Secondary | ICD-10-CM | POA: Diagnosis not present

## 2021-10-11 ENCOUNTER — Other Ambulatory Visit: Payer: Self-pay

## 2021-10-11 ENCOUNTER — Inpatient Hospital Stay (HOSPITAL_BASED_OUTPATIENT_CLINIC_OR_DEPARTMENT_OTHER): Payer: Medicare Other | Admitting: Oncology

## 2021-10-11 ENCOUNTER — Encounter: Payer: Self-pay | Admitting: Oncology

## 2021-10-11 ENCOUNTER — Inpatient Hospital Stay: Payer: Medicare Other

## 2021-10-11 VITALS — BP 121/58 | HR 79 | Temp 97.8°F | Resp 18 | Wt 159.8 lb

## 2021-10-11 DIAGNOSIS — C799 Secondary malignant neoplasm of unspecified site: Secondary | ICD-10-CM

## 2021-10-11 DIAGNOSIS — I2510 Atherosclerotic heart disease of native coronary artery without angina pectoris: Secondary | ICD-10-CM | POA: Diagnosis not present

## 2021-10-11 DIAGNOSIS — Z86010 Personal history of colonic polyps: Secondary | ICD-10-CM | POA: Diagnosis not present

## 2021-10-11 DIAGNOSIS — Z86711 Personal history of pulmonary embolism: Secondary | ICD-10-CM

## 2021-10-11 DIAGNOSIS — Z800 Family history of malignant neoplasm of digestive organs: Secondary | ICD-10-CM | POA: Diagnosis not present

## 2021-10-11 DIAGNOSIS — C20 Malignant neoplasm of rectum: Secondary | ICD-10-CM

## 2021-10-11 DIAGNOSIS — Z5111 Encounter for antineoplastic chemotherapy: Secondary | ICD-10-CM

## 2021-10-11 DIAGNOSIS — T451X5A Adverse effect of antineoplastic and immunosuppressive drugs, initial encounter: Secondary | ICD-10-CM

## 2021-10-11 DIAGNOSIS — R197 Diarrhea, unspecified: Secondary | ICD-10-CM | POA: Diagnosis not present

## 2021-10-11 DIAGNOSIS — K521 Toxic gastroenteritis and colitis: Secondary | ICD-10-CM

## 2021-10-11 DIAGNOSIS — Z7901 Long term (current) use of anticoagulants: Secondary | ICD-10-CM | POA: Diagnosis not present

## 2021-10-11 DIAGNOSIS — C775 Secondary and unspecified malignant neoplasm of intrapelvic lymph nodes: Secondary | ICD-10-CM | POA: Diagnosis not present

## 2021-10-11 DIAGNOSIS — Z923 Personal history of irradiation: Secondary | ICD-10-CM | POA: Diagnosis not present

## 2021-10-11 DIAGNOSIS — Z86718 Personal history of other venous thrombosis and embolism: Secondary | ICD-10-CM | POA: Diagnosis not present

## 2021-10-11 LAB — CBC WITH DIFFERENTIAL/PLATELET
Abs Immature Granulocytes: 0 10*3/uL (ref 0.00–0.07)
Basophils Absolute: 0 10*3/uL (ref 0.0–0.1)
Basophils Relative: 1 %
Eosinophils Absolute: 0.2 10*3/uL (ref 0.0–0.5)
Eosinophils Relative: 5 %
HCT: 36.6 % (ref 36.0–46.0)
Hemoglobin: 11.3 g/dL — ABNORMAL LOW (ref 12.0–15.0)
Immature Granulocytes: 0 %
Lymphocytes Relative: 33 %
Lymphs Abs: 1.4 10*3/uL (ref 0.7–4.0)
MCH: 30.4 pg (ref 26.0–34.0)
MCHC: 30.9 g/dL (ref 30.0–36.0)
MCV: 98.4 fL (ref 80.0–100.0)
Monocytes Absolute: 0.5 10*3/uL (ref 0.1–1.0)
Monocytes Relative: 12 %
Neutro Abs: 2.1 10*3/uL (ref 1.7–7.7)
Neutrophils Relative %: 49 %
Platelets: 229 10*3/uL (ref 150–400)
RBC: 3.72 MIL/uL — ABNORMAL LOW (ref 3.87–5.11)
RDW: 16.9 % — ABNORMAL HIGH (ref 11.5–15.5)
WBC: 4.2 10*3/uL (ref 4.0–10.5)
nRBC: 0 % (ref 0.0–0.2)

## 2021-10-11 LAB — COMPREHENSIVE METABOLIC PANEL
ALT: 14 U/L (ref 0–44)
AST: 23 U/L (ref 15–41)
Albumin: 3.9 g/dL (ref 3.5–5.0)
Alkaline Phosphatase: 46 U/L (ref 38–126)
Anion gap: 7 (ref 5–15)
BUN: 14 mg/dL (ref 8–23)
CO2: 27 mmol/L (ref 22–32)
Calcium: 9.3 mg/dL (ref 8.9–10.3)
Chloride: 101 mmol/L (ref 98–111)
Creatinine, Ser: 0.74 mg/dL (ref 0.44–1.00)
GFR, Estimated: >60 mL/min (ref >60)
Glucose, Bld: 99 mg/dL (ref 70–99)
Potassium: 4 mmol/L (ref 3.5–5.1)
Sodium: 135 mmol/L (ref 135–145)
Total Bilirubin: 0.3 mg/dL (ref 0.3–1.2)
Total Protein: 6.7 g/dL (ref 6.5–8.1)

## 2021-10-11 LAB — PROTEIN, URINE, RANDOM: Total Protein, Urine: 16 mg/dL

## 2021-10-11 MED ORDER — SODIUM CHLORIDE 0.9 % IV SOLN
750.0000 mg | Freq: Once | INTRAVENOUS | Status: AC
  Administered 2021-10-11: 10:00:00 750 mg via INTRAVENOUS
  Filled 2021-10-11: qty 37.5

## 2021-10-11 MED ORDER — SODIUM CHLORIDE 0.9 % IV SOLN
350.0000 mg | Freq: Once | INTRAVENOUS | Status: AC
  Administered 2021-10-11: 10:00:00 350 mg via INTRAVENOUS
  Filled 2021-10-11: qty 14

## 2021-10-11 MED ORDER — SODIUM CHLORIDE 0.9 % IV SOLN
Freq: Once | INTRAVENOUS | Status: AC
  Filled 2021-10-11: qty 250

## 2021-10-11 MED ORDER — SODIUM CHLORIDE 0.9 % IV SOLN
2400.0000 mg/m2 | INTRAVENOUS | Status: DC
  Administered 2021-10-11: 11:00:00 4300 mg via INTRAVENOUS
  Filled 2021-10-11: qty 86

## 2021-10-11 MED ORDER — SODIUM CHLORIDE 0.9 % IV SOLN
10.0000 mg | Freq: Once | INTRAVENOUS | Status: AC
  Administered 2021-10-11: 10:00:00 10 mg via INTRAVENOUS
  Filled 2021-10-11: qty 10

## 2021-10-11 NOTE — Progress Notes (Signed)
Hematology/Oncology progress note [phone] [phone]   Patient Care Team: Olin Hauser, DO as PCP - General (Family Medicine) Clent Jacks, RN as Registered Nurse Earlie Server, MD as Consulting Physician (Hematology and Oncology)  CHIEF COMPLAINTS/REASON FOR VISIT:  Follow up for rectal cancer  HISTORY OF PRESENTING ILLNESS:  Patient initially presented with complaints of postmenopausal bleeding on 08/16/2018.  History of was menopausal vaginal bleeding in 2016 which resulted in cervical polypectomy.  Pathology 02/04/2015 showed cervical polyp, consistent with benign endometrial polyp.  [name] after polypectomy due to anxiety associated with pelvic exams.  pelvic exam on 08/16/2018 reviewed cervical abnormality and from enlarged uterus. Seen by Dr. Marcelline Mates on 10/29/2018.  Endometrial biopsy and a Pap smear was performed. 10/29/2018 Pap smear showed adenocarcinoma, favor endometrial origin. 10/29/2018 endometrial biopsy showed endometrioid carcinoma, FIGO grade 1.  10/29/2018- TA & TV Ultrasound revealed: Anteverted uterus measuring 8.7 x 5.6 x 6.4 cm without evidence of focal masses.  The endometrium measuring 24.1 mm (thickened) and heterogeneous.  Right and left ovaries not visualized.  No adnexal masses identified.  No free fluid in cul-de-sac.  Patient was seen by Dr. Theora Gianotti in clinic on 11/13/2018.  Cervical exam reveals 2 cm exophytic irregular mass consistent with malignancy.   11/19/2018 CT chest abdomen pelvis with contrast showed thickened endometrium with some irregularity compatible with the provided diagnosis of endometrial malignancy.  There is a mildly prominent left inguinal node 1.4 cm.  Patient was seen by Dr. Fransisca Connors on 11/20/2018 and left groin lymph node biopsy was recommended.  11/26/2018 patient underwent left inguinal lymph node biopsy. Pathology showed metastatic adenocarcinoma consistent with colorectal origin.  CDX  2+.  Case was discussed on tumor board.  Recommend colonoscopy for further evaluation.  Patient reports significant weight loss 30 pounds over the last year.  Chronic vaginal spotting. Change of bowel habits the past few months.  More constipated.  Family history positive for brother who has colon cancer prostate cancer.  patient has underwent colonoscopy on 12/03/2018 which reviewed a nonobstructing large mass in the rectum.  Also chronic fistula.  Mass was not circumferential.  This was biopsied with a cold forceps for histology.  Pathology came back hyperplastic polyp negative for dysplasia and malignancy. [name], patient underwent flex sigmoidoscopy on 12/06/2018 with rebiopsy of the rectal mass. This time biopsy results came back positive for invasive colorectal adenocarcinoma, moderately differentiated. Immunotherapy for nearly mismatch repair protein (MMR ) was performed.  There is no loss of MMR expression.  low probability of MSI high.   # Seen by [name] for evaluation of resectability for rectal cancer. In addition, she also had a second opinion with [facility] where her endometrial biopsy pathology was changed to  adenocarcinoma, consistent with colorectal primary.   [name]. She has home health that has been assisting with ostomy care  Patient was also evaluated by [facility] oncology.  Recommendation is to proceed with TNT with concurrent chemoradiation followed by neoadjuvant chemotherapy followed by surgical resection. Patient prefers to have treatment done locally with [facility].   # Oncology Treatment:  02/03/2019- 03/19/2019  concurrent Xeloda and radiation.  Xeloda dose 816m /m2 BID - rounded to 1652mBID- on days of radiation. 04/09/2019, started on FOLFOX with bolus early.  Omitted.  07/16/2019 finished 8 cycles of FOLFOX. 09/17/19 APR/posterior vaginectomy/TAH/BSO/VY-flap pT4b pN0 with close vaginal margin 0.2  mm.  Uterus and ovaries negative for malignancy. Patient  reports bilateral lower extremity numbness and tingling, intermittent, left worse than right. She has lost a lot of weight since her APR surgery.   #Family history with half brother having's history of colon cancer prostate cancer.  Personal history of colorectal cancer.  Patient has not decided if she wants genetic testing.    # history of PE( 01/13/2020)  in the bilateral lower extremity DVT (01/13/2020).   She finishes 6 months of anticoagulation with Eliquis 5 mg twice daily. Now switched to Eliquis 2.5 mg twice daily..  # She has now developed recurrent disease. #06/30/20  vaginal introitus mass biopsied. Pathology is consistent with metastatic colorectal adenocarcinoma I have discussed with [name]  Dr. Hester Mates and the mass is not resectable. Patient has also had colonoscopy by Dr. Vicente Males yesterday. Normal examination. # 07/16/2020 cycle 1 FOLFIRI  # 07/20/2020 PET scan was done for further evaluation, images are consistent with local recurrence, no distant metastasis. #Discussed with radiation oncology Dr. Baruch Gouty will recommends concurrent chemotherapy and radiation. 08/02/2020-08/16/2020, patient starts radiation.  Xeloda was held due to neutropenia 08/17/2020,-09/06/2020 Xeloda 1500 mg twice daily concurrently with radiation  01/31/21 started on FOLFIRI + Bev 05/18/2021 CT chest abdomen pelvis showed Previously noted enlargement of bilateral inguinal lymph nodes is resolved, consistent with treatment response of nodal metastatic disease. Interval decrease in size of multiple small bilateral pulmonary nodules, consistent with treatment response of pulmonary metastatic disease. No evidence of new metastatic disease. 05/24/2021 - 08/30/2021, continued on FOLFIRI plus bevacizumab.  Irinotecan dose was reduced, eventually 100mg /m2  09/02/2021, CT chest abdomen pelvis without contrast Showed small bilateral pulmonary nodules, unchanged.   Stable metastatic disease.  No noncontrast evidence of new metastatic disease in the chest abdomen pelvis.  Small parastomal hernia.  Enlargement of main pulmonary artery.  Coronary artery disease.  09/13/2021, maintenance 5-FU/bevacizumab   INTERVAL HISTORY Jacqueline Yoder is a 75 y.o. female who has above history reviewed by me presents for follow-up of rectal cancer. Patient reports feeling well.  No diarrhea.  Appetite is good.  Weight is stable.   Review of Systems  Constitutional:  Positive for fatigue. Negative for appetite change, chills, fever and unexpected weight change.  HENT:   Negative for hearing loss and voice change.   Eyes:  Negative for eye problems.  Respiratory:  Negative for chest tightness and cough.   Cardiovascular:  Negative for chest pain.  Gastrointestinal:  Positive for diarrhea. Negative for abdominal distention, abdominal pain, blood in stool, constipation and nausea.  Endocrine: Negative for hot flashes.  Genitourinary:  Negative for difficulty urinating and frequency.   Musculoskeletal:  Negative for arthralgias.  Skin:  Negative for itching and rash.  Neurological:  Negative for extremity weakness and numbness.  Hematological:  Negative for adenopathy.  Psychiatric/Behavioral:  Negative for confusion.    MEDICAL HISTORY:  Past Medical History:  Diagnosis Date   Allergy    Arthritis    Blood clot in vein    Family history of colon cancer    GERD (gastroesophageal reflux disease)    Hypercholesteremia    Hypertension    Hypertension    Lower extremity edema    Rectal cancer ([place]) 12/2018   Urinary incontinence     SURGICAL HISTORY: Past Surgical History:  Procedure Laterality Date   ABDOMINAL HYSTERECTOMY     CHOLECYSTECTOMY  1971   COLONOSCOPY WITH PROPOFOL N/A 12/03/2018   Procedure: COLONOSCOPY WITH PROPOFOL;  Surgeon: Lucilla Lame, MD;  Location: [facility] ENDOSCOPY;  Service: Endoscopy;  Laterality: N/A;   COLONOSCOPY WITH PROPOFOL N/A  07/15/2020   Procedure: COLONOSCOPY WITH PROPOFOL;  Surgeon: [name], MD;  Location: [facility] ENDOSCOPY;  Service: Gastroenterology;  Laterality: N/A;   FLEXIBLE SIGMOIDOSCOPY N/A 12/06/2018   Procedure: FLEXIBLE SIGMOIDOSCOPY;  Surgeon: [name], MD;  Location: [facility] ENDOSCOPY;  Service: Endoscopy;  Laterality: N/A;   LAPAROSCOPIC COLOSTOMY  01/06/2019   PORTACATH PLACEMENT N/A 04/03/2019   Procedure: INSERTION PORT-A-CATH;  Surgeon: Jules Husbands, MD;  Location: [facility] ORS;  Service: General;  Laterality: N/A;    SOCIAL HISTORY: Social History   Socioeconomic History   Marital status: Widowed    Spouse name: Not on file   Number of children: Not on file   Years of education: Not on file   Highest education level: Not on file  Occupational History   Not on file  Tobacco Use   Smoking status: Former    Types: Cigarettes    Quit date: 12/02/1977    Years since quitting: 43.8   Smokeless tobacco: Former  Scientific laboratory technician Use: Never used  Substance and Sexual Activity   Alcohol use: Never   Drug use: Never   Sexual activity: Not Currently    Birth control/protection: None  Other Topics Concern   Not on file  Social History Narrative   Lives with daughter   Social Determinants of Health   Financial Resource Strain: Low Risk    Difficulty of Paying Living Expenses: Not hard at all  Food Insecurity: No Food Insecurity   Worried About Charity fundraiser in the Last Year: Never true   [place] in the Last Year: Never true  Transportation Needs: No Transportation Needs   Lack of Transportation (Medical): No   Lack of Transportation (Non-Medical): No  Physical Activity: Inactive   Days of Exercise per Week: 0 days   Minutes of Exercise per Session: 0 min  Stress: No Stress Concern Present   Feeling of Stress : Not at all  Social Connections: Not on file  Intimate Partner Violence: Not on file    FAMILY HISTORY: Family History  Problem Relation Age of Onset    Colon cancer Brother 1       exposure to chemicals [place]   Hypertension Mother    Stroke Mother    Kidney failure Father    Breast cancer Neg Hx    Ovarian cancer Neg Hx     ALLERGIES:  is allergic to sulfamethoxazole-trimethoprim.  MEDICATIONS:  Current Outpatient Medications  Medication Sig Dispense Refill   apixaban (ELIQUIS) 2.5 MG TABS tablet Take 1 tablet (2.5 mg total) by mouth 2 (two) times daily. 60 tablet 2   Cholecalciferol (VITAMIN D3) 2000 units capsule Take 2,000 Units by mouth daily.     diclofenac sodium (VOLTAREN) 1 % GEL Apply 2 g topically 4 (four) times daily as needed (joint pain).  11   fluticasone (FLONASE) 50 MCG/ACT nasal spray Use 1 spray(s) in each nostril once daily 16 g 1   gabapentin (NEURONTIN) 100 MG capsule Take 1 capsule (100 mg total) by mouth at bedtime. 30 capsule 0   ipratropium (ATROVENT) 0.03 % nasal spray Place 1 spray into both nostrils 2 (two) times daily. 30 mL 2   lidocaine-prilocaine (EMLA) cream Apply 1 application topically as needed. 30 g 6   loratadine (CLARITIN) 10 MG tablet Take 10 mg by mouth daily.     Multiple Vitamins-Minerals (ONE-A-DAY WOMENS 50 PLUS PO) Take 1 tablet  by mouth daily.      ondansetron (ZOFRAN) 8 MG tablet Take 1 tablet (8 mg total) by mouth 2 (two) times daily as needed for refractory nausea / vomiting. Start on day 3 after chemotherapy. 30 tablet 1   potassium chloride SA (KLOR-CON M) 20 MEQ tablet Take 2 tablets (40 mEq total) by mouth 2 (two) times daily. 120 tablet 1   prochlorperazine (COMPAZINE) 10 MG tablet Take 1 tablet (10 mg total) by mouth every 6 (six) hours as needed (NAUSEA). 30 tablet 1   simvastatin (ZOCOR) 40 MG tablet Take 1 tablet (40 mg total) by mouth at bedtime. 90 tablet 3   triamterene-hydrochlorothiazide (DYAZIDE) 37.5-25 MG capsule Take 1 each (1 capsule total) by mouth daily. 90 capsule 3   zinc gluconate 50 MG tablet Take 50 mg by mouth daily.     diphenoxylate-atropine (LOMOTIL)  2.5-0.025 MG tablet Take 1 tablet by mouth 4 (four) times daily as needed for diarrhea or loose stools. (Patient not taking: Reported on 09/27/2021) 30 tablet 1   loperamide (IMODIUM) 2 MG capsule Take 1 capsule (2 mg total) by mouth See admin instructions. With onset of loose stool, take $RemoveBef'4mg'uTJfDmUQHl$  followed by $RemoveBef'2mg'hBmldIuTee$  every 2 hours,  Maximum: 16 mg/day (Patient not taking: Reported on 09/27/2021) 120 capsule 1   No current facility-administered medications for this visit.   Facility-Administered Medications Ordered in Other Visits  Medication Dose Route Frequency Provider Last Rate Last Admin   fluorouracil (ADRUCIL) 4,300 mg in sodium chloride 0.9 % 64 mL chemo infusion  2,400 mg/m2 (Order-Specific) Intravenous 1 day or 1 dose Earlie Server, MD   4,300 mg at 10/11/21 1100     PHYSICAL EXAMINATION: ECOG PERFORMANCE STATUS: 1 - Symptomatic but completely ambulatory  Physical Exam Constitutional:      General: She is not in acute distress. HENT:     Head: Normocephalic and atraumatic.  Eyes:     General: No scleral icterus. Cardiovascular:     Rate and Rhythm: Normal rate and regular rhythm.     Heart sounds: Normal heart sounds.  Pulmonary:     Effort: Pulmonary effort is normal. No respiratory distress.     Breath sounds: No wheezing.  Abdominal:     General: Bowel sounds are normal. There is no distension.     Palpations: Abdomen is soft.     Comments: + Colostomy bag   Musculoskeletal:        General: No deformity. Normal range of motion.     Cervical back: Normal range of motion and neck supple.  Skin:    General: Skin is warm and dry.     Findings: No erythema or rash.  Neurological:     Mental Status: She is alert and oriented to person, place, and time. Mental status is at baseline.     Cranial Nerves: No cranial nerve deficit.     Coordination: Coordination normal.      LABORATORY DATA:  I have reviewed the data as listed Lab Results  Component Value Date   WBC 4.2 10/11/2021    HGB 11.3 (L) 10/11/2021   HCT 36.6 10/11/2021   MCV 98.4 10/11/2021   PLT 229 10/11/2021   Recent Labs    09/13/21 0821 09/27/21 0844 10/11/21 0816  NA 137 139 135  K 3.9 4.0 4.0  CL 105 105 101  CO2 $Re'26 26 27  'rzt$ GLUCOSE 103* 96 99  BUN $Re'11 16 14  'dwA$ CREATININE 1.08* 0.89 0.74  CALCIUM 9.0 9.3 9.3  GFRNONAA 54* >60 >60  PROT 6.5 6.5 6.7  ALBUMIN 4.1 3.7 3.9  AST $Re'24 24 23  'uqr$ ALT $R'22 16 14  'Ak$ ALKPHOS 107 53 46  BILITOT 0.3 0.5 0.3    Iron/TIBC/Ferritin/ %Sat    Component Value Date/Time   IRON 40 06/23/2020 1124   TIBC 246 (L) 06/23/2020 1124   FERRITIN 338 (H) 06/23/2020 1124   IRONPCTSAT 16 06/23/2020 1124     RADIOGRAPHIC STUDIES: I have personally reviewed the radiological images as listed and agreed with the findings in the report. CT CHEST ABDOMEN PELVIS WO CONTRAST  Result Date: 09/02/2021 CLINICAL DATA:  Metastatic rectal cancer, ongoing chemotherapy EXAM: CT CHEST, ABDOMEN AND PELVIS WITHOUT CONTRAST TECHNIQUE: Multidetector CT imaging of the chest, abdomen and pelvis was performed following the standard protocol without IV contrast. COMPARISON:  05/18/2021 FINDINGS: CT CHEST FINDINGS Cardiovascular: Right chest port catheter. Aortic atherosclerosis. Normal heart size. Left and right coronary artery calcifications. No pericardial effusion. Enlargement of the main pulmonary artery, measuring up to 3.7 cm in caliber. Mediastinum/Nodes: No enlarged mediastinal, hilar, or axillary lymph nodes. Thyroid gland, trachea, and esophagus demonstrate no significant findings. Lungs/Pleura: Multiple small bilateral pulmonary nodules are not significantly changed, for example a 0.5 cm nodule of the medial segment right middle lobe (series 3, image 103) and a 0.3 cm nodule of the posterior right upper lobe (series 3, image 3). No pleural effusion or pneumothorax. Musculoskeletal: No chest wall mass or suspicious bone lesions identified. CT ABDOMEN PELVIS FINDINGS Hepatobiliary: No focal liver  abnormality is seen. Status post cholecystectomy. Postoperative biliary dilatation. Pancreas: Unremarkable. No pancreatic ductal dilatation or surrounding inflammatory changes. Spleen: Normal in size without significant abnormality. Adrenals/Urinary Tract: Adrenal glands are unremarkable. Multiple bilateral renal cysts. Kidneys are otherwise normal, without renal calculi, solid lesion, or hydronephrosis. Bladder is unremarkable. Stomach/Bowel: Stomach is within normal limits. Appendix appears normal. No evidence of bowel wall thickening, distention, or inflammatory changes. Status post abdominoperineal resection and left lower quadrant end colostomy. Small parastomal hernia. Diverticulosis of the remnant sigmoid colon. Vascular/Lymphatic: Aortic atherosclerosis. No enlarged abdominal or pelvic lymph nodes. Reproductive: Status post hysterectomy. Other: No abdominal wall hernia or abnormality. No abdominopelvic ascites. Musculoskeletal: No acute or significant osseous findings. IMPRESSION: 1. Multiple small bilateral pulmonary nodules are not significantly changed, consistent with stable metastatic disease. 2. No noncontrast CT evidence of new metastatic disease in the chest, abdomen or pelvis. 3. Status post abdominoperineal resection and left lower quadrant end colostomy. Small parastomal hernia. 4. Enlargement of the main pulmonary artery, as can be seen in pulmonary hypertension. 5. Coronary artery disease. Aortic Atherosclerosis (ICD10-I70.0). Electronically Signed   By: Delanna Ahmadi M.D.   On: 09/02/2021 10:39      CT CHEST ABDOMEN PELVIS WO CONTRAST  Result Date: 09/02/2021 CLINICAL DATA:  Metastatic rectal cancer, ongoing chemotherapy EXAM: CT CHEST, ABDOMEN AND PELVIS WITHOUT CONTRAST TECHNIQUE: Multidetector CT imaging of the chest, abdomen and pelvis was performed following the standard protocol without IV contrast. COMPARISON:  05/18/2021 FINDINGS: CT CHEST FINDINGS Cardiovascular: Right chest  port catheter. Aortic atherosclerosis. Normal heart size. Left and right coronary artery calcifications. No pericardial effusion. Enlargement of the main pulmonary artery, measuring up to 3.7 cm in caliber. Mediastinum/Nodes: No enlarged mediastinal, hilar, or axillary lymph nodes. Thyroid gland, trachea, and esophagus demonstrate no significant findings. Lungs/Pleura: Multiple small bilateral pulmonary nodules are not significantly changed, for example a 0.5 cm nodule of the medial segment right middle lobe (series 3, image 103) and a 0.3 cm  nodule of the posterior right upper lobe (series 3, image 3). No pleural effusion or pneumothorax. Musculoskeletal: No chest wall mass or suspicious bone lesions identified. CT ABDOMEN PELVIS FINDINGS Hepatobiliary: No focal liver abnormality is seen. Status post cholecystectomy. Postoperative biliary dilatation. Pancreas: Unremarkable. No pancreatic ductal dilatation or surrounding inflammatory changes. Spleen: Normal in size without significant abnormality. Adrenals/Urinary Tract: Adrenal glands are unremarkable. Multiple bilateral renal cysts. Kidneys are otherwise normal, without renal calculi, solid lesion, or hydronephrosis. Bladder is unremarkable. Stomach/Bowel: Stomach is within normal limits. Appendix appears normal. No evidence of bowel wall thickening, distention, or inflammatory changes. Status post abdominoperineal resection and left lower quadrant end colostomy. Small parastomal hernia. Diverticulosis of the remnant sigmoid colon. Vascular/Lymphatic: Aortic atherosclerosis. No enlarged abdominal or pelvic lymph nodes. Reproductive: Status post hysterectomy. Other: No abdominal wall hernia or abnormality. No abdominopelvic ascites. Musculoskeletal: No acute or significant osseous findings. IMPRESSION: 1. Multiple small bilateral pulmonary nodules are not significantly changed, consistent with stable metastatic disease. 2. No noncontrast CT evidence of new  metastatic disease in the chest, abdomen or pelvis. 3. Status post abdominoperineal resection and left lower quadrant end colostomy. Small parastomal hernia. 4. Enlargement of the main pulmonary artery, as can be seen in pulmonary hypertension. 5. Coronary artery disease. Aortic Atherosclerosis (ICD10-I70.0). Electronically Signed   By: Delanna Ahmadi M.D.   On: 09/02/2021 10:39     ASSESSMENT & PLAN:  1. Encounter for antineoplastic chemotherapy   2. History of pulmonary embolism   3. Chemotherapy induced diarrhea   4. Rectal cancer ([place])   5. Metastatic adenocarcinoma ([place])   6. Current use of long term anticoagulation    Cancer Staging  Rectal cancer [facility]) Staging form: Colon and Rectum, AJCC 8th Edition - Clinical stage from 01/23/2019: Stage IIIC (cT4b, cN1a, cM0) - Signed by Earlie Server, MD on 01/23/2019 - Pathologic stage from 10/06/2019: Stage IIC (ypT4b, pN0, cM0) - Signed by Earlie Server, MD on 10/06/2019 - Pathologic: Stage Unknown (rpTX, pNX, cM1) - Signed by Earlie Server, MD on 01/31/2021  #History of stage IIIC Rectal cancer, s/p TNT, followed by 09/17/19 APR/posterior vaginectomy/TAH/BSO/VY-flap, pT4b pN0 with close vaginal margin 0.2 mm.  Uterus and ovaries negative for malignancy. palliative radiation to vaginal recurrence.  Finished 09/04/2020 01/19/21 recurrence with lung metastasis.-Palliative  Labs are reviewed and discussed with patient. Proceed with 5-FU and bevacizumab today .  #Chemotherapy-induced diarrhea, stable /improved symptoms.  Continue as needed continue Imodium or lomotil as needed.Marland Kitchen    #Hypokalemia, potassium 4.0, continue potassium chloride 20 mEq twice daily.  #History of pulmonary embolism, history of bilateral DVT Continue Eliquis 2.5 mg twice daily for anticoagulation prophylaxis.   Follow-up 2 weeks for next cycle of treatment.  Earlie Server, MD, PhD

## 2021-10-11 NOTE — Patient Instructions (Signed)
[facility] AT [place]  Discharge Instructions: Thank you for choosing [facility] to provide your oncology and hematology care.  If you have a lab appointment with the [facility], please go directly to the [facility] and check in at the registration area.  Wear comfortable clothing and clothing appropriate for easy access to any Portacath or PICC line.   We strive to give you quality time with your provider. You may need to reschedule your appointment if you arrive late (15 or more minutes).  Arriving late affects you and other patients whose appointments are after yours.  Also, if you miss three or more appointments without notifying the office, you may be dismissed from the clinic at the providers discretion.      For prescription refill requests, have your pharmacy contact our office and allow 72 hours for refills to be completed.    Today you received the following chemotherapy and/or immunotherapy agents : Avastin / Leucovorin / 5FU    To help prevent nausea and vomiting after your treatment, we encourage you to take your nausea medication as directed.  BELOW ARE SYMPTOMS THAT SHOULD BE REPORTED IMMEDIATELY: *FEVER GREATER THAN 100.4 F (38 C) OR HIGHER *CHILLS OR SWEATING *NAUSEA AND VOMITING THAT IS NOT CONTROLLED WITH YOUR NAUSEA MEDICATION *UNUSUAL SHORTNESS OF BREATH *UNUSUAL BRUISING OR BLEEDING *URINARY PROBLEMS (pain or burning when urinating, or frequent urination) *BOWEL PROBLEMS (unusual diarrhea, constipation, pain near the anus) TENDERNESS IN MOUTH AND THROAT WITH OR WITHOUT PRESENCE OF ULCERS (sore throat, sores in mouth, or a toothache) UNUSUAL RASH, SWELLING OR PAIN  UNUSUAL VAGINAL DISCHARGE OR ITCHING   Items with * indicate a potential emergency and should be followed up as soon as possible or go to the Emergency Department if any problems should occur.  Please show the CHEMOTHERAPY ALERT CARD or IMMUNOTHERAPY ALERT CARD  at check-in to the Emergency Department and triage nurse.  Should you have questions after your visit or need to cancel or reschedule your appointment, please contact [facility] [place] AT [place]  [phone] and follow the prompts.  Office hours are 8:00 a.m. to 4:30 p.m. Monday - Friday. Please note that voicemails left after 4:00 p.m. may not be returned until the following business day.  We are closed weekends and major holidays. You have access to a nurse at all times for urgent questions. Please call the main number to the clinic [phone] and follow the prompts.  For any non-urgent questions, you may also contact your provider using MyChart. We now offer e-Visits for anyone 74 and older to request care online for non-urgent symptoms. For details visit [email or web address].   Also download the MyChart app! Go to the app store, search "MyChart", open the app, select [facility], and log in with your MyChart username and password.  Due to Covid, a mask is required upon entering the hospital/clinic. If you do not have a mask, one will be given to you upon arrival. For doctor visits, patients may have 1 support person aged 74 or older with them. For treatment visits, patients cannot have anyone with them due to current Covid guidelines and our immunocompromised population.

## 2021-10-11 NOTE — Progress Notes (Signed)
Pt here for follow up. No new concerns voiced.   

## 2021-10-12 ENCOUNTER — Encounter: Payer: Self-pay | Admitting: Oncology

## 2021-10-12 LAB — CEA: CEA: 270 ng/mL — ABNORMAL HIGH (ref 0.0–4.7)

## 2021-10-12 NOTE — Telephone Encounter (Signed)
Pt scheduled for for tx on 1/10. Please move labs to the day prior. Keep MD/ tx on 1/10.

## 2021-10-13 ENCOUNTER — Inpatient Hospital Stay: Payer: Medicare Other

## 2021-10-13 ENCOUNTER — Other Ambulatory Visit: Payer: Self-pay

## 2021-10-13 DIAGNOSIS — C20 Malignant neoplasm of rectum: Secondary | ICD-10-CM | POA: Diagnosis not present

## 2021-10-13 DIAGNOSIS — E782 Mixed hyperlipidemia: Secondary | ICD-10-CM | POA: Diagnosis not present

## 2021-10-13 DIAGNOSIS — L89312 Pressure ulcer of right buttock, stage 2: Secondary | ICD-10-CM | POA: Diagnosis not present

## 2021-10-13 DIAGNOSIS — Z86711 Personal history of pulmonary embolism: Secondary | ICD-10-CM | POA: Diagnosis not present

## 2021-10-13 DIAGNOSIS — L989 Disorder of the skin and subcutaneous tissue, unspecified: Secondary | ICD-10-CM | POA: Diagnosis not present

## 2021-10-13 DIAGNOSIS — E876 Hypokalemia: Secondary | ICD-10-CM | POA: Diagnosis not present

## 2021-10-13 DIAGNOSIS — Z86010 Personal history of colonic polyps: Secondary | ICD-10-CM | POA: Diagnosis not present

## 2021-10-13 DIAGNOSIS — R197 Diarrhea, unspecified: Secondary | ICD-10-CM | POA: Diagnosis not present

## 2021-10-13 DIAGNOSIS — T451X5D Adverse effect of antineoplastic and immunosuppressive drugs, subsequent encounter: Secondary | ICD-10-CM | POA: Diagnosis not present

## 2021-10-13 DIAGNOSIS — K219 Gastro-esophageal reflux disease without esophagitis: Secondary | ICD-10-CM | POA: Diagnosis not present

## 2021-10-13 DIAGNOSIS — I2510 Atherosclerotic heart disease of native coronary artery without angina pectoris: Secondary | ICD-10-CM | POA: Diagnosis not present

## 2021-10-13 DIAGNOSIS — C7982 Secondary malignant neoplasm of genital organs: Secondary | ICD-10-CM | POA: Diagnosis not present

## 2021-10-13 DIAGNOSIS — I129 Hypertensive chronic kidney disease with stage 1 through stage 4 chronic kidney disease, or unspecified chronic kidney disease: Secondary | ICD-10-CM | POA: Diagnosis not present

## 2021-10-13 DIAGNOSIS — Z9181 History of falling: Secondary | ICD-10-CM | POA: Diagnosis not present

## 2021-10-13 DIAGNOSIS — Z933 Colostomy status: Secondary | ICD-10-CM | POA: Diagnosis not present

## 2021-10-13 DIAGNOSIS — D701 Agranulocytosis secondary to cancer chemotherapy: Secondary | ICD-10-CM | POA: Diagnosis not present

## 2021-10-13 DIAGNOSIS — Z5111 Encounter for antineoplastic chemotherapy: Secondary | ICD-10-CM | POA: Diagnosis not present

## 2021-10-13 DIAGNOSIS — Z86718 Personal history of other venous thrombosis and embolism: Secondary | ICD-10-CM | POA: Diagnosis not present

## 2021-10-13 DIAGNOSIS — Z4800 Encounter for change or removal of nonsurgical wound dressing: Secondary | ICD-10-CM | POA: Diagnosis not present

## 2021-10-13 DIAGNOSIS — I825Z3 Chronic embolism and thrombosis of unspecified deep veins of distal lower extremity, bilateral: Secondary | ICD-10-CM | POA: Diagnosis not present

## 2021-10-13 DIAGNOSIS — N182 Chronic kidney disease, stage 2 (mild): Secondary | ICD-10-CM | POA: Diagnosis not present

## 2021-10-13 DIAGNOSIS — Z7901 Long term (current) use of anticoagulants: Secondary | ICD-10-CM | POA: Diagnosis not present

## 2021-10-13 DIAGNOSIS — Z923 Personal history of irradiation: Secondary | ICD-10-CM | POA: Diagnosis not present

## 2021-10-13 DIAGNOSIS — Z87891 Personal history of nicotine dependence: Secondary | ICD-10-CM | POA: Diagnosis not present

## 2021-10-13 DIAGNOSIS — C775 Secondary and unspecified malignant neoplasm of intrapelvic lymph nodes: Secondary | ICD-10-CM | POA: Diagnosis not present

## 2021-10-13 DIAGNOSIS — M1990 Unspecified osteoarthritis, unspecified site: Secondary | ICD-10-CM | POA: Diagnosis not present

## 2021-10-13 DIAGNOSIS — C799 Secondary malignant neoplasm of unspecified site: Secondary | ICD-10-CM

## 2021-10-13 DIAGNOSIS — Z800 Family history of malignant neoplasm of digestive organs: Secondary | ICD-10-CM | POA: Diagnosis not present

## 2021-10-13 DIAGNOSIS — Z7951 Long term (current) use of inhaled steroids: Secondary | ICD-10-CM | POA: Diagnosis not present

## 2021-10-13 MED ORDER — HEPARIN SOD (PORK) LOCK FLUSH 100 UNIT/ML IV SOLN
500.0000 [IU] | Freq: Once | INTRAVENOUS | Status: AC | PRN
  Administered 2021-10-13: 13:00:00 500 [IU]
  Filled 2021-10-13: qty 5

## 2021-10-13 MED ORDER — HEPARIN SOD (PORK) LOCK FLUSH 100 UNIT/ML IV SOLN
INTRAVENOUS | Status: AC
  Filled 2021-10-13: qty 5

## 2021-10-13 MED ORDER — SODIUM CHLORIDE 0.9% FLUSH
10.0000 mL | INTRAVENOUS | Status: DC | PRN
  Administered 2021-10-13: 13:00:00 10 mL
  Filled 2021-10-13: qty 10

## 2021-10-18 DIAGNOSIS — I825Z3 Chronic embolism and thrombosis of unspecified deep veins of distal lower extremity, bilateral: Secondary | ICD-10-CM | POA: Diagnosis not present

## 2021-10-18 DIAGNOSIS — Z4800 Encounter for change or removal of nonsurgical wound dressing: Secondary | ICD-10-CM | POA: Diagnosis not present

## 2021-10-18 DIAGNOSIS — Z7901 Long term (current) use of anticoagulants: Secondary | ICD-10-CM | POA: Diagnosis not present

## 2021-10-18 DIAGNOSIS — E876 Hypokalemia: Secondary | ICD-10-CM | POA: Diagnosis not present

## 2021-10-18 DIAGNOSIS — Z87891 Personal history of nicotine dependence: Secondary | ICD-10-CM | POA: Diagnosis not present

## 2021-10-18 DIAGNOSIS — L89312 Pressure ulcer of right buttock, stage 2: Secondary | ICD-10-CM | POA: Diagnosis not present

## 2021-10-18 DIAGNOSIS — M1990 Unspecified osteoarthritis, unspecified site: Secondary | ICD-10-CM | POA: Diagnosis not present

## 2021-10-18 DIAGNOSIS — Z7951 Long term (current) use of inhaled steroids: Secondary | ICD-10-CM | POA: Diagnosis not present

## 2021-10-18 DIAGNOSIS — Z933 Colostomy status: Secondary | ICD-10-CM | POA: Diagnosis not present

## 2021-10-18 DIAGNOSIS — D701 Agranulocytosis secondary to cancer chemotherapy: Secondary | ICD-10-CM | POA: Diagnosis not present

## 2021-10-18 DIAGNOSIS — N182 Chronic kidney disease, stage 2 (mild): Secondary | ICD-10-CM | POA: Diagnosis not present

## 2021-10-18 DIAGNOSIS — C7982 Secondary malignant neoplasm of genital organs: Secondary | ICD-10-CM | POA: Diagnosis not present

## 2021-10-18 DIAGNOSIS — K219 Gastro-esophageal reflux disease without esophagitis: Secondary | ICD-10-CM | POA: Diagnosis not present

## 2021-10-18 DIAGNOSIS — L989 Disorder of the skin and subcutaneous tissue, unspecified: Secondary | ICD-10-CM | POA: Diagnosis not present

## 2021-10-18 DIAGNOSIS — I129 Hypertensive chronic kidney disease with stage 1 through stage 4 chronic kidney disease, or unspecified chronic kidney disease: Secondary | ICD-10-CM | POA: Diagnosis not present

## 2021-10-18 DIAGNOSIS — C20 Malignant neoplasm of rectum: Secondary | ICD-10-CM | POA: Diagnosis not present

## 2021-10-18 DIAGNOSIS — T451X5D Adverse effect of antineoplastic and immunosuppressive drugs, subsequent encounter: Secondary | ICD-10-CM | POA: Diagnosis not present

## 2021-10-18 DIAGNOSIS — Z9181 History of falling: Secondary | ICD-10-CM | POA: Diagnosis not present

## 2021-10-18 DIAGNOSIS — E782 Mixed hyperlipidemia: Secondary | ICD-10-CM | POA: Diagnosis not present

## 2021-10-21 DIAGNOSIS — Z933 Colostomy status: Secondary | ICD-10-CM | POA: Diagnosis not present

## 2021-10-21 DIAGNOSIS — K219 Gastro-esophageal reflux disease without esophagitis: Secondary | ICD-10-CM | POA: Diagnosis not present

## 2021-10-21 DIAGNOSIS — L89312 Pressure ulcer of right buttock, stage 2: Secondary | ICD-10-CM | POA: Diagnosis not present

## 2021-10-21 DIAGNOSIS — E876 Hypokalemia: Secondary | ICD-10-CM | POA: Diagnosis not present

## 2021-10-21 DIAGNOSIS — T451X5D Adverse effect of antineoplastic and immunosuppressive drugs, subsequent encounter: Secondary | ICD-10-CM | POA: Diagnosis not present

## 2021-10-21 DIAGNOSIS — D701 Agranulocytosis secondary to cancer chemotherapy: Secondary | ICD-10-CM | POA: Diagnosis not present

## 2021-10-21 DIAGNOSIS — Z9181 History of falling: Secondary | ICD-10-CM | POA: Diagnosis not present

## 2021-10-21 DIAGNOSIS — Z4800 Encounter for change or removal of nonsurgical wound dressing: Secondary | ICD-10-CM | POA: Diagnosis not present

## 2021-10-21 DIAGNOSIS — I129 Hypertensive chronic kidney disease with stage 1 through stage 4 chronic kidney disease, or unspecified chronic kidney disease: Secondary | ICD-10-CM | POA: Diagnosis not present

## 2021-10-21 DIAGNOSIS — Z7901 Long term (current) use of anticoagulants: Secondary | ICD-10-CM | POA: Diagnosis not present

## 2021-10-21 DIAGNOSIS — I825Z3 Chronic embolism and thrombosis of unspecified deep veins of distal lower extremity, bilateral: Secondary | ICD-10-CM | POA: Diagnosis not present

## 2021-10-21 DIAGNOSIS — Z86711 Personal history of pulmonary embolism: Secondary | ICD-10-CM | POA: Diagnosis not present

## 2021-10-21 DIAGNOSIS — C7982 Secondary malignant neoplasm of genital organs: Secondary | ICD-10-CM | POA: Diagnosis not present

## 2021-10-21 DIAGNOSIS — C20 Malignant neoplasm of rectum: Secondary | ICD-10-CM | POA: Diagnosis not present

## 2021-10-21 DIAGNOSIS — N182 Chronic kidney disease, stage 2 (mild): Secondary | ICD-10-CM | POA: Diagnosis not present

## 2021-10-21 DIAGNOSIS — E782 Mixed hyperlipidemia: Secondary | ICD-10-CM | POA: Diagnosis not present

## 2021-10-21 DIAGNOSIS — Z87891 Personal history of nicotine dependence: Secondary | ICD-10-CM | POA: Diagnosis not present

## 2021-10-21 DIAGNOSIS — Z79899 Other long term (current) drug therapy: Secondary | ICD-10-CM | POA: Diagnosis not present

## 2021-10-21 DIAGNOSIS — M1990 Unspecified osteoarthritis, unspecified site: Secondary | ICD-10-CM | POA: Diagnosis not present

## 2021-10-24 ENCOUNTER — Inpatient Hospital Stay: Payer: Medicare Other | Attending: Oncology

## 2021-10-24 ENCOUNTER — Other Ambulatory Visit: Payer: Self-pay

## 2021-10-24 ENCOUNTER — Other Ambulatory Visit: Payer: Medicare Other

## 2021-10-24 DIAGNOSIS — Z5189 Encounter for other specified aftercare: Secondary | ICD-10-CM | POA: Insufficient documentation

## 2021-10-24 DIAGNOSIS — C799 Secondary malignant neoplasm of unspecified site: Secondary | ICD-10-CM

## 2021-10-24 DIAGNOSIS — C20 Malignant neoplasm of rectum: Secondary | ICD-10-CM | POA: Insufficient documentation

## 2021-10-24 DIAGNOSIS — Z86711 Personal history of pulmonary embolism: Secondary | ICD-10-CM | POA: Insufficient documentation

## 2021-10-24 DIAGNOSIS — Z7901 Long term (current) use of anticoagulants: Secondary | ICD-10-CM | POA: Insufficient documentation

## 2021-10-24 DIAGNOSIS — Z87891 Personal history of nicotine dependence: Secondary | ICD-10-CM | POA: Diagnosis not present

## 2021-10-24 DIAGNOSIS — Z79899 Other long term (current) drug therapy: Secondary | ICD-10-CM | POA: Insufficient documentation

## 2021-10-24 DIAGNOSIS — E876 Hypokalemia: Secondary | ICD-10-CM | POA: Insufficient documentation

## 2021-10-24 DIAGNOSIS — Z5112 Encounter for antineoplastic immunotherapy: Secondary | ICD-10-CM | POA: Diagnosis not present

## 2021-10-24 DIAGNOSIS — T451X5A Adverse effect of antineoplastic and immunosuppressive drugs, initial encounter: Secondary | ICD-10-CM | POA: Insufficient documentation

## 2021-10-24 DIAGNOSIS — Z86718 Personal history of other venous thrombosis and embolism: Secondary | ICD-10-CM | POA: Insufficient documentation

## 2021-10-24 DIAGNOSIS — C774 Secondary and unspecified malignant neoplasm of inguinal and lower limb lymph nodes: Secondary | ICD-10-CM | POA: Insufficient documentation

## 2021-10-24 DIAGNOSIS — Z5111 Encounter for antineoplastic chemotherapy: Secondary | ICD-10-CM | POA: Insufficient documentation

## 2021-10-24 DIAGNOSIS — D701 Agranulocytosis secondary to cancer chemotherapy: Secondary | ICD-10-CM | POA: Diagnosis not present

## 2021-10-24 LAB — CBC WITH DIFFERENTIAL/PLATELET
Abs Immature Granulocytes: 0 10*3/uL (ref 0.00–0.07)
Basophils Absolute: 0 10*3/uL (ref 0.0–0.1)
Basophils Relative: 1 %
Eosinophils Absolute: 0.1 10*3/uL (ref 0.0–0.5)
Eosinophils Relative: 5 %
HCT: 38.3 % (ref 36.0–46.0)
Hemoglobin: 11.9 g/dL — ABNORMAL LOW (ref 12.0–15.0)
Immature Granulocytes: 0 %
Lymphocytes Relative: 45 %
Lymphs Abs: 1.1 10*3/uL (ref 0.7–4.0)
MCH: 31 pg (ref 26.0–34.0)
MCHC: 31.1 g/dL (ref 30.0–36.0)
MCV: 99.7 fL (ref 80.0–100.0)
Monocytes Absolute: 0.3 10*3/uL (ref 0.1–1.0)
Monocytes Relative: 11 %
Neutro Abs: 1 10*3/uL — ABNORMAL LOW (ref 1.7–7.7)
Neutrophils Relative %: 38 %
Platelets: 181 10*3/uL (ref 150–400)
RBC: 3.84 MIL/uL — ABNORMAL LOW (ref 3.87–5.11)
RDW: 17 % — ABNORMAL HIGH (ref 11.5–15.5)
WBC: 2.5 10*3/uL — ABNORMAL LOW (ref 4.0–10.5)
nRBC: 0 % (ref 0.0–0.2)

## 2021-10-24 LAB — COMPREHENSIVE METABOLIC PANEL
ALT: 19 U/L (ref 0–44)
AST: 30 U/L (ref 15–41)
Albumin: 4 g/dL (ref 3.5–5.0)
Alkaline Phosphatase: 49 U/L (ref 38–126)
Anion gap: 7 (ref 5–15)
BUN: 11 mg/dL (ref 8–23)
CO2: 29 mmol/L (ref 22–32)
Calcium: 9.6 mg/dL (ref 8.9–10.3)
Chloride: 103 mmol/L (ref 98–111)
Creatinine, Ser: 0.96 mg/dL (ref 0.44–1.00)
GFR, Estimated: >60 mL/min (ref >60)
Glucose, Bld: 75 mg/dL (ref 70–99)
Potassium: 4 mmol/L (ref 3.5–5.1)
Sodium: 139 mmol/L (ref 135–145)
Total Bilirubin: 0.8 mg/dL (ref 0.3–1.2)
Total Protein: 6.8 g/dL (ref 6.5–8.1)

## 2021-10-24 LAB — PROTEIN, URINE, RANDOM: Total Protein, Urine: 18 mg/dL

## 2021-10-25 ENCOUNTER — Inpatient Hospital Stay: Payer: Medicare Other

## 2021-10-25 ENCOUNTER — Encounter: Payer: Self-pay | Admitting: Oncology

## 2021-10-25 ENCOUNTER — Other Ambulatory Visit: Payer: Medicare Other

## 2021-10-25 ENCOUNTER — Inpatient Hospital Stay (HOSPITAL_BASED_OUTPATIENT_CLINIC_OR_DEPARTMENT_OTHER): Payer: Medicare Other | Admitting: Oncology

## 2021-10-25 VITALS — BP 118/67 | HR 71 | Temp 97.2°F | Wt 161.3 lb

## 2021-10-25 DIAGNOSIS — C20 Malignant neoplasm of rectum: Secondary | ICD-10-CM

## 2021-10-25 DIAGNOSIS — C799 Secondary malignant neoplasm of unspecified site: Secondary | ICD-10-CM

## 2021-10-25 DIAGNOSIS — D701 Agranulocytosis secondary to cancer chemotherapy: Secondary | ICD-10-CM

## 2021-10-25 DIAGNOSIS — Z7901 Long term (current) use of anticoagulants: Secondary | ICD-10-CM | POA: Diagnosis not present

## 2021-10-25 DIAGNOSIS — T451X5A Adverse effect of antineoplastic and immunosuppressive drugs, initial encounter: Secondary | ICD-10-CM

## 2021-10-25 DIAGNOSIS — Z86711 Personal history of pulmonary embolism: Secondary | ICD-10-CM | POA: Diagnosis not present

## 2021-10-25 DIAGNOSIS — Z87891 Personal history of nicotine dependence: Secondary | ICD-10-CM | POA: Diagnosis not present

## 2021-10-25 DIAGNOSIS — Z5111 Encounter for antineoplastic chemotherapy: Secondary | ICD-10-CM

## 2021-10-25 DIAGNOSIS — Z5189 Encounter for other specified aftercare: Secondary | ICD-10-CM | POA: Diagnosis not present

## 2021-10-25 DIAGNOSIS — Z5112 Encounter for antineoplastic immunotherapy: Secondary | ICD-10-CM | POA: Diagnosis not present

## 2021-10-25 DIAGNOSIS — Z79899 Other long term (current) drug therapy: Secondary | ICD-10-CM | POA: Diagnosis not present

## 2021-10-25 DIAGNOSIS — C774 Secondary and unspecified malignant neoplasm of inguinal and lower limb lymph nodes: Secondary | ICD-10-CM | POA: Diagnosis not present

## 2021-10-25 DIAGNOSIS — Z86718 Personal history of other venous thrombosis and embolism: Secondary | ICD-10-CM | POA: Diagnosis not present

## 2021-10-25 DIAGNOSIS — E876 Hypokalemia: Secondary | ICD-10-CM | POA: Diagnosis not present

## 2021-10-25 MED ORDER — SODIUM CHLORIDE 0.9 % IV SOLN
350.0000 mg | Freq: Once | INTRAVENOUS | Status: AC
  Administered 2021-10-25: 350 mg via INTRAVENOUS
  Filled 2021-10-25: qty 14

## 2021-10-25 MED ORDER — SODIUM CHLORIDE 0.9 % IV SOLN
2400.0000 mg/m2 | INTRAVENOUS | Status: DC
  Administered 2021-10-25: 4300 mg via INTRAVENOUS
  Filled 2021-10-25: qty 86

## 2021-10-25 MED ORDER — SODIUM CHLORIDE 0.9% FLUSH
10.0000 mL | INTRAVENOUS | Status: DC | PRN
  Administered 2021-10-25: 10 mL
  Filled 2021-10-25: qty 10

## 2021-10-25 MED ORDER — SODIUM CHLORIDE 0.9 % IV SOLN
Freq: Once | INTRAVENOUS | Status: AC
  Filled 2021-10-25: qty 250

## 2021-10-25 MED ORDER — SODIUM CHLORIDE 0.9 % IV SOLN
10.0000 mg | Freq: Once | INTRAVENOUS | Status: AC
  Administered 2021-10-25: 10 mg via INTRAVENOUS
  Filled 2021-10-25: qty 10

## 2021-10-25 MED ORDER — SODIUM CHLORIDE 0.9 % IV SOLN
750.0000 mg | Freq: Once | INTRAVENOUS | Status: AC
  Administered 2021-10-25: 750 mg via INTRAVENOUS
  Filled 2021-10-25: qty 37.5

## 2021-10-25 NOTE — Progress Notes (Signed)
Hematology/Oncology progress note [phone] [phone]   Patient Care Team: Olin Hauser, DO as PCP - General (Family Medicine) Clent Jacks, RN as Registered Nurse Earlie Server, MD as Consulting Physician (Hematology and Oncology)  CHIEF COMPLAINTS/REASON FOR VISIT:  Follow up for rectal cancer  HISTORY OF PRESENTING ILLNESS:  Patient initially presented with complaints of postmenopausal bleeding on 08/16/2018.  History of was menopausal vaginal bleeding in 2016 which resulted in cervical polypectomy.  Pathology 02/04/2015 showed cervical polyp, consistent with benign endometrial polyp.  [name] after polypectomy due to anxiety associated with pelvic exams.  pelvic exam on 08/16/2018 reviewed cervical abnormality and from enlarged uterus. Seen by Dr. Marcelline Mates on 10/29/2018.  Endometrial biopsy and a Pap smear was performed. 10/29/2018 Pap smear showed adenocarcinoma, favor endometrial origin. 10/29/2018 endometrial biopsy showed endometrioid carcinoma, FIGO grade 1.  10/29/2018- TA & TV Ultrasound revealed: Anteverted uterus measuring 8.7 x 5.6 x 6.4 cm without evidence of focal masses.  The endometrium measuring 24.1 mm (thickened) and heterogeneous.  Right and left ovaries not visualized.  No adnexal masses identified.  No free fluid in cul-de-sac.  Patient was seen by Dr. Theora Gianotti in clinic on 11/13/2018.  Cervical exam reveals 2 cm exophytic irregular mass consistent with malignancy.   11/19/2018 CT chest abdomen pelvis with contrast showed thickened endometrium with some irregularity compatible with the provided diagnosis of endometrial malignancy.  There is a mildly prominent left inguinal node 1.4 cm.  Patient was seen by Dr. Fransisca Connors on 11/20/2018 and left groin lymph node biopsy was recommended.  11/26/2018 patient underwent left inguinal lymph node biopsy. Pathology showed metastatic adenocarcinoma consistent with colorectal origin.  CDX  2+.  Case was discussed on tumor board.  Recommend colonoscopy for further evaluation.  Patient reports significant weight loss 30 pounds over the last year.  Chronic vaginal spotting. Change of bowel habits the past few months.  More constipated.  Family history positive for brother who has colon cancer prostate cancer.  patient has underwent colonoscopy on 12/03/2018 which reviewed a nonobstructing large mass in the rectum.  Also chronic fistula.  Mass was not circumferential.  This was biopsied with a cold forceps for histology.  Pathology came back hyperplastic polyp negative for dysplasia and malignancy. [name], patient underwent flex sigmoidoscopy on 12/06/2018 with rebiopsy of the rectal mass. This time biopsy results came back positive for invasive colorectal adenocarcinoma, moderately differentiated. Immunotherapy for nearly mismatch repair protein (MMR ) was performed.  There is no loss of MMR expression.  low probability of MSI high.   # Seen by [name] for evaluation of resectability for rectal cancer. In addition, she also had a second opinion with [facility] where her endometrial biopsy pathology was changed to  adenocarcinoma, consistent with colorectal primary.   [name]. She has home health that has been assisting with ostomy care  Patient was also evaluated by [facility] oncology.  Recommendation is to proceed with TNT with concurrent chemoradiation followed by neoadjuvant chemotherapy followed by surgical resection. Patient prefers to have treatment done locally with [facility].   # Oncology Treatment:  02/03/2019- 03/19/2019  concurrent Xeloda and radiation.  Xeloda dose 816m /m2 BID - rounded to 1652mBID- on days of radiation. 04/09/2019, started on FOLFOX with bolus early.  Omitted.  07/16/2019 finished 8 cycles of FOLFOX. 09/17/19 APR/posterior vaginectomy/TAH/BSO/VY-flap pT4b pN0 with close vaginal margin 0.2  mm.  Uterus and ovaries negative for malignancy. Patient  reports bilateral lower extremity numbness and tingling, intermittent, left worse than right. She has lost a lot of weight since her APR surgery.   #Family history with half brother having's history of colon cancer prostate cancer.  Personal history of colorectal cancer.  Patient has not decided if she wants genetic testing.    # history of PE( 01/13/2020)  in the bilateral lower extremity DVT (01/13/2020).   She finishes 6 months of anticoagulation with Eliquis 5 mg twice daily. Now switched to Eliquis 2.5 mg twice daily..  # She has now developed recurrent disease. #06/30/20  vaginal introitus mass biopsied. Pathology is consistent with metastatic colorectal adenocarcinoma I have discussed with [name]  Dr. Hester Mates and the mass is not resectable. Patient has also had colonoscopy by Dr. Vicente Males yesterday. Normal examination. # 07/16/2020 cycle 1 FOLFIRI  # 07/20/2020 PET scan was done for further evaluation, images are consistent with local recurrence, no distant metastasis. #Discussed with radiation oncology Dr. Baruch Gouty will recommends concurrent chemotherapy and radiation. 08/02/2020-08/16/2020, patient starts radiation.  Xeloda was held due to neutropenia 08/17/2020,-09/06/2020 Xeloda 1500 mg twice daily concurrently with radiation  01/31/21 started on FOLFIRI + Bev 05/18/2021 CT chest abdomen pelvis showed Previously noted enlargement of bilateral inguinal lymph nodes is resolved, consistent with treatment response of nodal metastatic disease. Interval decrease in size of multiple small bilateral pulmonary nodules, consistent with treatment response of pulmonary metastatic disease. No evidence of new metastatic disease. 05/24/2021 - 08/30/2021, continued on FOLFIRI plus bevacizumab.  Irinotecan dose was reduced, eventually 156m/m2  09/02/2021, CT chest abdomen pelvis without contrast Showed small bilateral pulmonary nodules, unchanged.   Stable metastatic disease.  No noncontrast evidence of new metastatic disease in the chest abdomen pelvis.  Small parastomal hernia.  Enlargement of main pulmonary artery.  Coronary artery disease.  09/13/2021, maintenance 5-FU/bevacizumab   INTERVAL HISTORY KALIS SAWCHUKis a 76y.o. female who has above history reviewed by me presents for follow-up of rectal cancer. Patient reports feeling well.  Denies any diarrhea.  Appetite is good.  She has gained weight, 2 pounds since last visit.    Review of Systems  Constitutional:  Positive for fatigue. Negative for appetite change, chills, fever and unexpected weight change.  HENT:   Negative for hearing loss and voice change.   Eyes:  Negative for eye problems.  Respiratory:  Negative for chest tightness and cough.   Cardiovascular:  Negative for chest pain.  Gastrointestinal:  Negative for abdominal distention, abdominal pain, blood in stool, constipation, diarrhea and nausea.  Endocrine: Negative for hot flashes.  Genitourinary:  Negative for difficulty urinating and frequency.   Musculoskeletal:  Negative for arthralgias.  Skin:  Negative for itching and rash.  Neurological:  Negative for extremity weakness and numbness.  Hematological:  Negative for adenopathy.  Psychiatric/Behavioral:  Negative for confusion.    MEDICAL HISTORY:  Past Medical History:  Diagnosis Date   Allergy    Arthritis    Blood clot in vein    Family history of colon cancer    GERD (gastroesophageal reflux disease)    Hypercholesteremia    Hypertension    Hypertension    Lower extremity edema    [place]   Urinary incontinence     SURGICAL HISTORY: Past Surgical History:  Procedure Laterality Date   ABDOMINAL HYSTERECTOMY     CHOLECYSTECTOMY  1971   COLONOSCOPY WITH PROPOFOL N/A 12/03/2018   Procedure: COLONOSCOPY WITH PROPOFOL;  Surgeon: WLucilla Lame MD;  Location:  [place] [facility];  Service: Endoscopy;  Laterality: N/A;    COLONOSCOPY WITH PROPOFOL N/A 07/15/2020   Procedure: COLONOSCOPY WITH PROPOFOL;  Surgeon: [name], MD;  Location: [facility] ENDOSCOPY;  Service: Gastroenterology;  Laterality: N/A;   FLEXIBLE SIGMOIDOSCOPY N/A 12/06/2018   Procedure: FLEXIBLE SIGMOIDOSCOPY;  Surgeon: [name], MD;  Location: [facility] ENDOSCOPY;  Service: Endoscopy;  Laterality: N/A;   LAPAROSCOPIC COLOSTOMY  01/06/2019   PORTACATH PLACEMENT N/A 04/03/2019   Procedure: INSERTION PORT-A-CATH;  Surgeon: Jules Husbands, MD;  Location: [facility] ORS;  Service: General;  Laterality: N/A;    SOCIAL HISTORY: Social History   Socioeconomic History   Marital status: Widowed    Spouse name: Not on file   Number of children: Not on file   Years of education: Not on file   Highest education level: Not on file  Occupational History   Not on file  Tobacco Use   Smoking status: Former    Types: Cigarettes    Quit date: 12/02/1977    Years since quitting: 43.9   Smokeless tobacco: Former  Scientific laboratory technician Use: Never used  Substance and Sexual Activity   Alcohol use: Never   Drug use: Never   Sexual activity: Not Currently    Birth control/protection: None  Other Topics Concern   Not on file  Social History Narrative   Lives with daughter   Social Determinants of Health   Financial Resource Strain: Low Risk    Difficulty of Paying Living Expenses: Not hard at all  Food Insecurity: No Food Insecurity   Worried About Charity fundraiser in the Last Year: Never true   [place] in the Last Year: Never true  Transportation Needs: No Transportation Needs   Lack of Transportation (Medical): No   Lack of Transportation (Non-Medical): No  Physical Activity: Inactive   Days of Exercise per Week: 0 days   Minutes of Exercise per Session: 0 min  Stress: No Stress Concern Present   Feeling of Stress : Not at all  Social Connections: Not on file  Intimate Partner Violence: Not on file    FAMILY HISTORY: Family History   Problem Relation Age of Onset   Colon cancer Brother 37       exposure to chemicals [place]   Hypertension Mother    Stroke Mother    Kidney failure Father    Breast cancer Neg Hx    Ovarian cancer Neg Hx     ALLERGIES:  is allergic to sulfamethoxazole-trimethoprim.  MEDICATIONS:  Current Outpatient Medications  Medication Sig Dispense Refill   apixaban (ELIQUIS) 2.5 MG TABS tablet Take 1 tablet (2.5 mg total) by mouth 2 (two) times daily. 60 tablet 2   Cholecalciferol (VITAMIN D3) 2000 units capsule Take 2,000 Units by mouth daily.     diclofenac sodium (VOLTAREN) 1 % GEL Apply 2 g topically 4 (four) times daily as needed (joint pain).  11   fluticasone (FLONASE) 50 MCG/ACT nasal spray Use 1 spray(s) in each nostril once daily 16 g 1   gabapentin (NEURONTIN) 100 MG capsule Take 1 capsule (100 mg total) by mouth at bedtime. 30 capsule 0   ipratropium (ATROVENT) 0.03 % nasal spray Place 1 spray into both nostrils 2 (two) times daily. 30 mL 2   lidocaine-prilocaine (EMLA) cream Apply 1 application topically as needed. 30 g 6   loratadine (CLARITIN) 10 MG tablet Take 10 mg by mouth daily.     Multiple Vitamins-Minerals (ONE-A-DAY WOMENS  50 PLUS PO) Take 1 tablet by mouth daily.      ondansetron (ZOFRAN) 8 MG tablet Take 1 tablet (8 mg total) by mouth 2 (two) times daily as needed for refractory nausea / vomiting. Start on day 3 after chemotherapy. 30 tablet 1   potassium chloride SA (KLOR-CON M) 20 MEQ tablet Take 2 tablets (40 mEq total) by mouth 2 (two) times daily. 120 tablet 1   prochlorperazine (COMPAZINE) 10 MG tablet Take 1 tablet (10 mg total) by mouth every 6 (six) hours as needed (NAUSEA). 30 tablet 1   simvastatin (ZOCOR) 40 MG tablet Take 1 tablet (40 mg total) by mouth at bedtime. 90 tablet 3   triamterene-hydrochlorothiazide (DYAZIDE) 37.5-25 MG capsule Take 1 each (1 capsule total) by mouth daily. 90 capsule 3   zinc gluconate 50 MG tablet Take 50 mg by mouth daily.      diphenoxylate-atropine (LOMOTIL) 2.5-0.025 MG tablet Take 1 tablet by mouth 4 (four) times daily as needed for diarrhea or loose stools. (Patient not taking: Reported on 09/27/2021) 30 tablet 1   loperamide (IMODIUM) 2 MG capsule Take 1 capsule (2 mg total) by mouth See admin instructions. With onset of loose stool, take 32m followed by 259mevery 2 hours,  Maximum: 16 mg/day (Patient not taking: Reported on 09/27/2021) 120 capsule 1   No current facility-administered medications for this visit.   Facility-Administered Medications Ordered in Other Visits  Medication Dose Route Frequency Provider Last Rate Last Admin   fluorouracil (ADRUCIL) 4,300 mg in sodium chloride 0.9 % 64 mL chemo infusion  2,400 mg/m2 (Order-Specific) Intravenous 1 day or 1 dose YuEarlie ServerMD   4,300 mg at 10/25/21 1122   sodium chloride flush (NS) 0.9 % injection 10 mL  10 mL Intracatheter PRN YuEarlie ServerMD   10 mL at 10/25/21 1005     PHYSICAL EXAMINATION: ECOG PERFORMANCE STATUS: 1 - Symptomatic but completely ambulatory  Physical Exam Constitutional:      General: She is not in acute distress. HENT:     Head: Normocephalic and atraumatic.  Eyes:     General: No scleral icterus. Cardiovascular:     Rate and Rhythm: Normal rate and regular rhythm.     Heart sounds: Normal heart sounds.  Pulmonary:     Effort: Pulmonary effort is normal. No respiratory distress.     Breath sounds: No wheezing.  Abdominal:     General: Bowel sounds are normal. There is no distension.     Palpations: Abdomen is soft.     Comments: + Colostomy bag   Musculoskeletal:        General: No deformity. Normal range of motion.     Cervical back: Normal range of motion and neck supple.  Skin:    General: Skin is warm and dry.     Findings: No erythema or rash.  Neurological:     Mental Status: She is alert and oriented to person, place, and time. Mental status is at baseline.     Cranial Nerves: No cranial nerve deficit.      Coordination: Coordination normal.      LABORATORY DATA:  I have reviewed the data as listed Lab Results  Component Value Date   WBC 2.5 (L) 10/24/2021   HGB 11.9 (L) 10/24/2021   HCT 38.3 10/24/2021   MCV 99.7 10/24/2021   PLT 181 10/24/2021   Recent Labs    09/27/21 0844 10/11/21 0816 10/24/21 0857  NA 139 135 139  K  4.0 4.0 4.0  CL 105 101 103  CO2 _0 GLUCOSE 96 99 75  BUN _1 CREATININE 0.89 0.74 0.96  CALCIUM 9.3 9.3 9.6  GFRNONAA >60 >60 >60  PROT 6.5 6.7 6.8  ALBUMIN 3.7 3.9 4.0  AST _2 ALT _3 ALKPHOS 53 46 49  BILITOT 0.5 0.3 0.8    Iron/TIBC/Ferritin/ %Sat    Component Value Date/Time   IRON 40 06/23/2020 1124   TIBC 246 (L) 06/23/2020 1124   FERRITIN 338 (H) 06/23/2020 1124   IRONPCTSAT 16 06/23/2020 1124     RADIOGRAPHIC STUDIES: I have personally reviewed the radiological images as listed and agreed with the findings in the report. CT CHEST ABDOMEN PELVIS WO CONTRAST  Result Date: 09/02/2021 CLINICAL DATA:  Metastatic rectal cancer, ongoing chemotherapy EXAM: CT CHEST, ABDOMEN AND PELVIS WITHOUT CONTRAST TECHNIQUE: Multidetector CT imaging of the chest, abdomen and pelvis was performed following the standard protocol without IV contrast. COMPARISON:  05/18/2021 FINDINGS: CT CHEST FINDINGS Cardiovascular: Right chest port catheter. Aortic atherosclerosis. Normal heart size. Left and right coronary artery calcifications. No pericardial effusion. Enlargement of the main pulmonary artery, measuring up to 3.7 cm in caliber. Mediastinum/Nodes: No enlarged mediastinal, hilar, or axillary lymph nodes. Thyroid gland, trachea, and esophagus demonstrate no significant findings. Lungs/Pleura: Multiple small bilateral pulmonary nodules are not significantly changed, for example a 0.5 cm nodule of the medial segment right middle lobe (series 3, image 103) and a 0.3 cm nodule of the posterior right upper lobe (series 3, image 3). No pleural  effusion or pneumothorax. Musculoskeletal: No chest wall mass or suspicious bone lesions identified. CT ABDOMEN PELVIS FINDINGS Hepatobiliary: No focal liver abnormality is seen. Status post cholecystectomy. Postoperative biliary dilatation. Pancreas: Unremarkable. No pancreatic ductal dilatation or surrounding inflammatory changes. Spleen: Normal in size without significant abnormality. Adrenals/Urinary Tract: Adrenal glands are unremarkable. Multiple bilateral renal cysts. Kidneys are otherwise normal, without renal calculi, solid lesion, or hydronephrosis. Bladder is unremarkable. Stomach/Bowel: Stomach is within normal limits. Appendix appears normal. No evidence of bowel wall thickening, distention, or inflammatory changes. Status post abdominoperineal resection and left lower quadrant end colostomy. Small parastomal hernia. Diverticulosis of the remnant sigmoid colon. Vascular/Lymphatic: Aortic atherosclerosis. No enlarged abdominal or pelvic lymph nodes. Reproductive: Status post hysterectomy. Other: No abdominal wall hernia or abnormality. No abdominopelvic ascites. Musculoskeletal: No acute or significant osseous findings. IMPRESSION: 1. Multiple small bilateral pulmonary nodules are not significantly changed, consistent with stable metastatic disease. 2. No noncontrast CT evidence of new metastatic disease in the chest, abdomen or pelvis. 3. Status post abdominoperineal resection and left lower quadrant end colostomy. Small parastomal hernia. 4. Enlargement of the main pulmonary artery, as can be seen in pulmonary hypertension. 5. Coronary artery disease. Aortic Atherosclerosis (ICD10-I70.0). Electronically Signed   By: Delanna Ahmadi M.D.   On: 09/02/2021 10:39      CT CHEST ABDOMEN PELVIS WO CONTRAST  Result Date: 09/02/2021 CLINICAL DATA:  Metastatic rectal cancer, ongoing chemotherapy EXAM: CT CHEST, ABDOMEN AND PELVIS WITHOUT CONTRAST TECHNIQUE: Multidetector CT imaging of the chest, abdomen and  pelvis was performed following the standard protocol without IV contrast. COMPARISON:  05/18/2021 FINDINGS: CT CHEST FINDINGS Cardiovascular: Right chest port catheter. Aortic atherosclerosis. Normal heart size. Left and right coronary artery calcifications. No pericardial effusion. Enlargement of the main pulmonary artery, measuring up to 3.7 cm in caliber. Mediastinum/Nodes: No enlarged mediastinal, hilar, or axillary lymph nodes. Thyroid gland, trachea, and esophagus demonstrate  no significant findings. Lungs/Pleura: Multiple small bilateral pulmonary nodules are not significantly changed, for example a 0.5 cm nodule of the medial segment right middle lobe (series 3, image 103) and a 0.3 cm nodule of the posterior right upper lobe (series 3, image 3). No pleural effusion or pneumothorax. Musculoskeletal: No chest wall mass or suspicious bone lesions identified. CT ABDOMEN PELVIS FINDINGS Hepatobiliary: No focal liver abnormality is seen. Status post cholecystectomy. Postoperative biliary dilatation. Pancreas: Unremarkable. No pancreatic ductal dilatation or surrounding inflammatory changes. Spleen: Normal in size without significant abnormality. Adrenals/Urinary Tract: Adrenal glands are unremarkable. Multiple bilateral renal cysts. Kidneys are otherwise normal, without renal calculi, solid lesion, or hydronephrosis. Bladder is unremarkable. Stomach/Bowel: Stomach is within normal limits. Appendix appears normal. No evidence of bowel wall thickening, distention, or inflammatory changes. Status post abdominoperineal resection and left lower quadrant end colostomy. Small parastomal hernia. Diverticulosis of the remnant sigmoid colon. Vascular/Lymphatic: Aortic atherosclerosis. No enlarged abdominal or pelvic lymph nodes. Reproductive: Status post hysterectomy. Other: No abdominal wall hernia or abnormality. No abdominopelvic ascites. Musculoskeletal: No acute or significant osseous findings. IMPRESSION: 1. Multiple  small bilateral pulmonary nodules are not significantly changed, consistent with stable metastatic disease. 2. No noncontrast CT evidence of new metastatic disease in the chest, abdomen or pelvis. 3. Status post abdominoperineal resection and left lower quadrant end colostomy. Small parastomal hernia. 4. Enlargement of the main pulmonary artery, as can be seen in pulmonary hypertension. 5. Coronary artery disease. Aortic Atherosclerosis (ICD10-I70.0). Electronically Signed   By: Delanna Ahmadi M.D.   On: 09/02/2021 10:39     ASSESSMENT & PLAN:  1. Metastatic adenocarcinoma ([place])   2. Encounter for antineoplastic chemotherapy   3. History of pulmonary embolism   4. Rectal cancer [facility])    Cancer Staging  Rectal cancer [facility]) Staging form: Colon and Rectum, AJCC 8th Edition - Clinical stage from 01/23/2019: Stage IIIC (cT4b, cN1a, cM0) - Signed by Earlie Server, MD on 01/23/2019 - Pathologic stage from 10/06/2019: Stage IIC (ypT4b, pN0, cM0) - Signed by Earlie Server, MD on 10/06/2019 - Pathologic: Stage Unknown (rpTX, pNX, cM1) - Signed by Earlie Server, MD on 01/31/2021  #History of stage IIIC Rectal cancer, s/p TNT, followed by 09/17/19 APR/posterior vaginectomy/TAH/BSO/VY-flap, pT4b pN0 with close vaginal margin 0.2 mm.  Uterus and ovaries negative for malignancy. palliative radiation to vaginal recurrence.  Finished 09/04/2020 01/19/21 recurrence with lung metastasis.-Palliative  Labs are reviewed and discussed with patient. Proceed with 5-FU and bevacizumab today CEA has trended up to 270, repeat level is pending. If persistently trending up, will repeat CT. Marland Kitchen  #Chemotherapy-induced diarrhea, stable /improved symptoms.  Continue as needed continue Imodium or lomotil as needed..   # chemotherapy induced neutropenia ANC 1, will resume her on GCSF prophylaxis.   #Hypokalemia, continue potassium chloride 20 mEq twice daily.  #History of pulmonary embolism, history of bilateral DVT Continue Eliquis 2.5 mg twice daily  for anticoagulation prophylaxis.   Follow-up 2 weeks for next cycle of treatment.  Earlie Server, MD, PhD

## 2021-10-25 NOTE — Patient Instructions (Signed)
[facility] AT [place]  Discharge Instructions: Thank you for choosing [facility] to provide your oncology and hematology care.  If you have a lab appointment with the [facility], please go directly to the [facility] and check in at the registration area.  Wear comfortable clothing and clothing appropriate for easy access to any Portacath or PICC line.   We strive to give you quality time with your provider. You may need to reschedule your appointment if you arrive late (15 or more minutes).  Arriving late affects you and other patients whose appointments are after yours.  Also, if you miss three or more appointments without notifying the office, you may be dismissed from the clinic at the providers discretion.      For prescription refill requests, have your pharmacy contact our office and allow 72 hours for refills to be completed.    Today you received the following chemotherapy and/or immunotherapy agents - bevacizumab, fluorouracil      To help prevent nausea and vomiting after your treatment, we encourage you to take your nausea medication as directed.  BELOW ARE SYMPTOMS THAT SHOULD BE REPORTED IMMEDIATELY: *FEVER GREATER THAN 100.4 F (38 C) OR HIGHER *CHILLS OR SWEATING *NAUSEA AND VOMITING THAT IS NOT CONTROLLED WITH YOUR NAUSEA MEDICATION *UNUSUAL SHORTNESS OF BREATH *UNUSUAL BRUISING OR BLEEDING *URINARY PROBLEMS (pain or burning when urinating, or frequent urination) *BOWEL PROBLEMS (unusual diarrhea, constipation, pain near the anus) TENDERNESS IN MOUTH AND THROAT WITH OR WITHOUT PRESENCE OF ULCERS (sore throat, sores in mouth, or a toothache) UNUSUAL RASH, SWELLING OR PAIN  UNUSUAL VAGINAL DISCHARGE OR ITCHING   Items with * indicate a potential emergency and should be followed up as soon as possible or go to the Emergency Department if any problems should occur.  Please show the CHEMOTHERAPY ALERT CARD or IMMUNOTHERAPY ALERT  CARD at check-in to the Emergency Department and triage nurse.  Should you have questions after your visit or need to cancel or reschedule your appointment, please contact [facility] [place] AT [place]  [phone] and follow the prompts.  Office hours are 8:00 a.m. to 4:30 p.m. Monday - Friday. Please note that voicemails left after 4:00 p.m. may not be returned until the following business day.  We are closed weekends and major holidays. You have access to a nurse at all times for urgent questions. Please call the main number to the clinic [phone] and follow the prompts.  For any non-urgent questions, you may also contact your provider using MyChart. We now offer e-Visits for anyone 47 and older to request care online for non-urgent symptoms. For details visit [email or web address].   Also download the MyChart app! Go to the app store, search "MyChart", open the app, select [facility], and log in with your MyChart username and password.  Due to Covid, a mask is required upon entering the hospital/clinic. If you do not have a mask, one will be given to you upon arrival. For doctor visits, patients may have 1 support person aged 84 or older with them. For treatment visits, patients cannot have anyone with them due to current Covid guidelines and our immunocompromised population.   Bevacizumab injection What is this medication? BEVACIZUMAB (be va SIZ yoo mab) is a monoclonal antibody. It is used to treat many types of cancer. This medicine may be used for other purposes; ask your health care provider or pharmacist if you have questions. COMMON BRAND NAME(S): Alymsys, Avastin, MVASI, Zirabev What should  I tell my care team before I take this medication? They need to know if you have any of these conditions: diabetes heart disease high blood pressure history of coughing up blood prior anthracycline chemotherapy (e.g., doxorubicin, daunorubicin, epirubicin) recent or ongoing  radiation therapy recent or planning to have surgery stroke an unusual or allergic reaction to bevacizumab, hamster proteins, mouse proteins, other medicines, foods, dyes, or preservatives pregnant or trying to get pregnant breast-feeding How should I use this medication? This medicine is for infusion into a vein. It is given by a health care professional in a hospital or clinic setting. Talk to your pediatrician regarding the use of this medicine in children. Special care may be needed. Overdosage: If you think you have taken too much of this medicine contact a poison control center or emergency room at once. NOTE: This medicine is only for you. Do not share this medicine with others. What if I miss a dose? It is important not to miss your dose. Call your doctor or health care professional if you are unable to keep an appointment. What may interact with this medication? Interactions are not expected. This list may not describe all possible interactions. Give your health care provider a list of all the medicines, herbs, non-prescription drugs, or dietary supplements you use. Also tell them if you smoke, drink alcohol, or use illegal drugs. Some items may interact with your medicine. What should I watch for while using this medication? Your condition will be monitored carefully while you are receiving this medicine. You will need important blood work and urine testing done while you are taking this medicine. This medicine may increase your risk to bruise or bleed. Call your doctor or health care professional if you notice any unusual bleeding. Before having surgery, talk to your health care provider to make sure it is ok. This drug can increase the risk of poor healing of your surgical site or wound. You will need to stop this drug for 28 days before surgery. After surgery, wait at least 28 days before restarting this drug. Make sure the surgical site or wound is healed enough before restarting  this drug. Talk to your health care provider if questions. Do not become pregnant while taking this medicine or for 6 months after stopping it. Women should inform their doctor if they wish to become pregnant or think they might be pregnant. There is a potential for serious side effects to an unborn child. Talk to your health care professional or pharmacist for more information. Do not breast-feed an infant while taking this medicine and for 6 months after the last dose. This medicine has caused ovarian failure in some women. This medicine may interfere with the ability to have a child. You should talk to your doctor or health care professional if you are concerned about your fertility. What side effects may I notice from receiving this medication? Side effects that you should report to your doctor or health care professional as soon as possible: allergic reactions like skin rash, itching or hives, swelling of the face, lips, or tongue chest pain or chest tightness chills coughing up blood high fever seizures severe constipation signs and symptoms of bleeding such as bloody or black, tarry stools; red or dark-brown urine; spitting up blood or brown material that looks like coffee grounds; red spots on the skin; unusual bruising or bleeding from the eye, gums, or nose signs and symptoms of a blood clot such as breathing problems; chest pain; severe, sudden  headache; pain, swelling, warmth in the leg signs and symptoms of a stroke like changes in vision; confusion; trouble speaking or understanding; severe headaches; sudden numbness or weakness of the face, arm or leg; trouble walking; dizziness; loss of balance or coordination stomach pain sweating swelling of legs or ankles vomiting weight gain Side effects that usually do not require medical attention (report to your doctor or health care professional if they continue or are bothersome): back pain changes in taste decreased appetite dry  skin nausea tiredness This list may not describe all possible side effects. Call your doctor for medical advice about side effects. You may report side effects to FDA at 1-800-FDA-1088. Where should I keep my medication? This drug is given in a hospital or clinic and will not be stored at home. NOTE: This sheet is a summary. It may not cover all possible information. If you have questions about this medicine, talk to your doctor, pharmacist, or health care provider.  2022 Elsevier/Gold Standard (2021-06-21 00:00:00)  Fluorouracil, 5-FU injection What is this medication? FLUOROURACIL, 5-FU (flure oh YOOR a sil) is a chemotherapy drug. It slows the growth of cancer cells. This medicine is used to treat many types of cancer like breast cancer, colon or rectal cancer, pancreatic cancer, and stomach cancer. This medicine may be used for other purposes; ask your health care provider or pharmacist if you have questions. COMMON BRAND NAME(S): Adrucil What should I tell my care team before I take this medication? They need to know if you have any of these conditions: blood disorders dihydropyrimidine dehydrogenase (DPD) deficiency infection (especially a virus infection such as chickenpox, cold sores, or herpes) kidney disease liver disease malnourished, poor nutrition recent or ongoing radiation therapy an unusual or allergic reaction to fluorouracil, other chemotherapy, other medicines, foods, dyes, or preservatives pregnant or trying to get pregnant breast-feeding How should I use this medication? This drug is given as an infusion or injection into a vein. It is administered in a hospital or clinic by a specially trained health care professional. Talk to your pediatrician regarding the use of this medicine in children. Special care may be needed. Overdosage: If you think you have taken too much of this medicine contact a poison control center or emergency room at once. NOTE: This medicine is  only for you. Do not share this medicine with others. What if I miss a dose? It is important not to miss your dose. Call your doctor or health care professional if you are unable to keep an appointment. What may interact with this medication? Do not take this medicine with any of the following medications: live virus vaccines This medicine may also interact with the following medications: medicines that treat or prevent blood clots like warfarin, enoxaparin, and dalteparin This list may not describe all possible interactions. Give your health care provider a list of all the medicines, herbs, non-prescription drugs, or dietary supplements you use. Also tell them if you smoke, drink alcohol, or use illegal drugs. Some items may interact with your medicine. What should I watch for while using this medication? Visit your doctor for checks on your progress. This drug may make you feel generally unwell. This is not uncommon, as chemotherapy can affect healthy cells as well as cancer cells. Report any side effects. Continue your course of treatment even though you feel ill unless your doctor tells you to stop. In some cases, you may be given additional medicines to help with side effects. Follow all directions for  their use. Call your doctor or health care professional for advice if you get a fever, chills or sore throat, or other symptoms of a cold or flu. Do not treat yourself. This drug decreases your body's ability to fight infections. Try to avoid being around people who are sick. This medicine may increase your risk to bruise or bleed. Call your doctor or health care professional if you notice any unusual bleeding. Be careful brushing and flossing your teeth or using a toothpick because you may get an infection or bleed more easily. If you have any dental work done, tell your dentist you are receiving this medicine. Avoid taking products that contain aspirin, acetaminophen, ibuprofen, naproxen, or  ketoprofen unless instructed by your doctor. These medicines may hide a fever. Do not become pregnant while taking this medicine. Women should inform their doctor if they wish to become pregnant or think they might be pregnant. There is a potential for serious side effects to an unborn child. Talk to your health care professional or pharmacist for more information. Do not breast-feed an infant while taking this medicine. Men should inform their doctor if they wish to father a child. This medicine may lower sperm counts. Do not treat diarrhea with over the counter products. Contact your doctor if you have diarrhea that lasts more than 2 days or if it is severe and watery. This medicine can make you more sensitive to the sun. Keep out of the sun. If you cannot avoid being in the sun, wear protective clothing and use sunscreen. Do not use sun lamps or tanning beds/booths. What side effects may I notice from receiving this medication? Side effects that you should report to your doctor or health care professional as soon as possible: allergic reactions like skin rash, itching or hives, swelling of the face, lips, or tongue low blood counts - this medicine may decrease the number of white blood cells, red blood cells and platelets. You may be at increased risk for infections and bleeding. signs of infection - fever or chills, cough, sore throat, pain or difficulty passing urine signs of decreased platelets or bleeding - bruising, pinpoint red spots on the skin, black, tarry stools, blood in the urine signs of decreased red blood cells - unusually weak or tired, fainting spells, lightheadedness breathing problems changes in vision chest pain mouth sores nausea and vomiting pain, swelling, redness at site where injected pain, tingling, numbness in the hands or feet redness, swelling, or sores on hands or feet stomach pain unusual bleeding Side effects that usually do not require medical attention  (report to your doctor or health care professional if they continue or are bothersome): changes in finger or toe nails diarrhea dry or itchy skin hair loss headache loss of appetite sensitivity of eyes to the light stomach upset unusually teary eyes This list may not describe all possible side effects. Call your doctor for medical advice about side effects. You may report side effects to FDA at 1-800-FDA-1088. Where should I keep my medication? This drug is given in a hospital or clinic and will not be stored at home. NOTE: This sheet is a summary. It may not cover all possible information. If you have questions about this medicine, talk to your doctor, pharmacist, or health care provider.  2022 Elsevier/Gold Standard (2021-06-21 00:00:00)

## 2021-10-26 LAB — CEA: CEA: 321 ng/mL — ABNORMAL HIGH (ref 0.0–4.7)

## 2021-10-27 ENCOUNTER — Other Ambulatory Visit: Payer: Self-pay

## 2021-10-27 ENCOUNTER — Inpatient Hospital Stay: Payer: Medicare Other

## 2021-10-27 VITALS — BP 135/60 | HR 72 | Temp 98.3°F

## 2021-10-27 DIAGNOSIS — Z5189 Encounter for other specified aftercare: Secondary | ICD-10-CM | POA: Diagnosis not present

## 2021-10-27 DIAGNOSIS — Z5112 Encounter for antineoplastic immunotherapy: Secondary | ICD-10-CM | POA: Diagnosis not present

## 2021-10-27 DIAGNOSIS — C774 Secondary and unspecified malignant neoplasm of inguinal and lower limb lymph nodes: Secondary | ICD-10-CM | POA: Diagnosis not present

## 2021-10-27 DIAGNOSIS — Z86711 Personal history of pulmonary embolism: Secondary | ICD-10-CM | POA: Diagnosis not present

## 2021-10-27 DIAGNOSIS — Z87891 Personal history of nicotine dependence: Secondary | ICD-10-CM | POA: Diagnosis not present

## 2021-10-27 DIAGNOSIS — Z9181 History of falling: Secondary | ICD-10-CM | POA: Diagnosis not present

## 2021-10-27 DIAGNOSIS — C7982 Secondary malignant neoplasm of genital organs: Secondary | ICD-10-CM | POA: Diagnosis not present

## 2021-10-27 DIAGNOSIS — I129 Hypertensive chronic kidney disease with stage 1 through stage 4 chronic kidney disease, or unspecified chronic kidney disease: Secondary | ICD-10-CM | POA: Diagnosis not present

## 2021-10-27 DIAGNOSIS — C20 Malignant neoplasm of rectum: Secondary | ICD-10-CM

## 2021-10-27 DIAGNOSIS — Z86718 Personal history of other venous thrombosis and embolism: Secondary | ICD-10-CM | POA: Diagnosis not present

## 2021-10-27 DIAGNOSIS — Z79899 Other long term (current) drug therapy: Secondary | ICD-10-CM | POA: Diagnosis not present

## 2021-10-27 DIAGNOSIS — E876 Hypokalemia: Secondary | ICD-10-CM | POA: Diagnosis not present

## 2021-10-27 DIAGNOSIS — Z4800 Encounter for change or removal of nonsurgical wound dressing: Secondary | ICD-10-CM | POA: Diagnosis not present

## 2021-10-27 DIAGNOSIS — Z5111 Encounter for antineoplastic chemotherapy: Secondary | ICD-10-CM | POA: Diagnosis not present

## 2021-10-27 DIAGNOSIS — C799 Secondary malignant neoplasm of unspecified site: Secondary | ICD-10-CM

## 2021-10-27 DIAGNOSIS — I825Z3 Chronic embolism and thrombosis of unspecified deep veins of distal lower extremity, bilateral: Secondary | ICD-10-CM | POA: Diagnosis not present

## 2021-10-27 DIAGNOSIS — K219 Gastro-esophageal reflux disease without esophagitis: Secondary | ICD-10-CM | POA: Diagnosis not present

## 2021-10-27 DIAGNOSIS — T451X5D Adverse effect of antineoplastic and immunosuppressive drugs, subsequent encounter: Secondary | ICD-10-CM | POA: Diagnosis not present

## 2021-10-27 DIAGNOSIS — Z7901 Long term (current) use of anticoagulants: Secondary | ICD-10-CM | POA: Diagnosis not present

## 2021-10-27 DIAGNOSIS — T451X5A Adverse effect of antineoplastic and immunosuppressive drugs, initial encounter: Secondary | ICD-10-CM | POA: Diagnosis not present

## 2021-10-27 DIAGNOSIS — E782 Mixed hyperlipidemia: Secondary | ICD-10-CM | POA: Diagnosis not present

## 2021-10-27 DIAGNOSIS — L89312 Pressure ulcer of right buttock, stage 2: Secondary | ICD-10-CM | POA: Diagnosis not present

## 2021-10-27 DIAGNOSIS — Z933 Colostomy status: Secondary | ICD-10-CM | POA: Diagnosis not present

## 2021-10-27 DIAGNOSIS — N182 Chronic kidney disease, stage 2 (mild): Secondary | ICD-10-CM | POA: Diagnosis not present

## 2021-10-27 DIAGNOSIS — M1990 Unspecified osteoarthritis, unspecified site: Secondary | ICD-10-CM | POA: Diagnosis not present

## 2021-10-27 DIAGNOSIS — D701 Agranulocytosis secondary to cancer chemotherapy: Secondary | ICD-10-CM | POA: Diagnosis not present

## 2021-10-27 MED ORDER — HEPARIN SOD (PORK) LOCK FLUSH 100 UNIT/ML IV SOLN
500.0000 [IU] | Freq: Once | INTRAVENOUS | Status: AC | PRN
  Administered 2021-10-27: 500 [IU]
  Filled 2021-10-27: qty 5

## 2021-10-27 MED ORDER — PEGFILGRASTIM-BMEZ 6 MG/0.6ML ~~LOC~~ SOSY
6.0000 mg | PREFILLED_SYRINGE | Freq: Once | SUBCUTANEOUS | Status: AC
  Administered 2021-10-27: 6 mg via SUBCUTANEOUS
  Filled 2021-10-27: qty 0.6

## 2021-10-27 MED ORDER — SODIUM CHLORIDE 0.9% FLUSH
10.0000 mL | INTRAVENOUS | Status: DC | PRN
  Administered 2021-10-27: 10 mL
  Filled 2021-10-27: qty 10

## 2021-10-27 NOTE — Patient Instructions (Signed)

## 2021-10-29 DIAGNOSIS — E876 Hypokalemia: Secondary | ICD-10-CM | POA: Diagnosis not present

## 2021-10-29 DIAGNOSIS — Z4800 Encounter for change or removal of nonsurgical wound dressing: Secondary | ICD-10-CM | POA: Diagnosis not present

## 2021-10-29 DIAGNOSIS — N182 Chronic kidney disease, stage 2 (mild): Secondary | ICD-10-CM | POA: Diagnosis not present

## 2021-10-29 DIAGNOSIS — I825Z3 Chronic embolism and thrombosis of unspecified deep veins of distal lower extremity, bilateral: Secondary | ICD-10-CM | POA: Diagnosis not present

## 2021-10-29 DIAGNOSIS — L89312 Pressure ulcer of right buttock, stage 2: Secondary | ICD-10-CM | POA: Diagnosis not present

## 2021-10-29 DIAGNOSIS — Z7901 Long term (current) use of anticoagulants: Secondary | ICD-10-CM | POA: Diagnosis not present

## 2021-10-29 DIAGNOSIS — C20 Malignant neoplasm of rectum: Secondary | ICD-10-CM | POA: Diagnosis not present

## 2021-10-29 DIAGNOSIS — K219 Gastro-esophageal reflux disease without esophagitis: Secondary | ICD-10-CM | POA: Diagnosis not present

## 2021-10-29 DIAGNOSIS — D701 Agranulocytosis secondary to cancer chemotherapy: Secondary | ICD-10-CM | POA: Diagnosis not present

## 2021-10-29 DIAGNOSIS — E782 Mixed hyperlipidemia: Secondary | ICD-10-CM | POA: Diagnosis not present

## 2021-10-29 DIAGNOSIS — Z79899 Other long term (current) drug therapy: Secondary | ICD-10-CM | POA: Diagnosis not present

## 2021-10-29 DIAGNOSIS — Z87891 Personal history of nicotine dependence: Secondary | ICD-10-CM | POA: Diagnosis not present

## 2021-10-29 DIAGNOSIS — T451X5D Adverse effect of antineoplastic and immunosuppressive drugs, subsequent encounter: Secondary | ICD-10-CM | POA: Diagnosis not present

## 2021-10-29 DIAGNOSIS — M1990 Unspecified osteoarthritis, unspecified site: Secondary | ICD-10-CM | POA: Diagnosis not present

## 2021-10-29 DIAGNOSIS — C7982 Secondary malignant neoplasm of genital organs: Secondary | ICD-10-CM | POA: Diagnosis not present

## 2021-10-29 DIAGNOSIS — Z933 Colostomy status: Secondary | ICD-10-CM | POA: Diagnosis not present

## 2021-10-29 DIAGNOSIS — I129 Hypertensive chronic kidney disease with stage 1 through stage 4 chronic kidney disease, or unspecified chronic kidney disease: Secondary | ICD-10-CM | POA: Diagnosis not present

## 2021-10-29 DIAGNOSIS — Z9181 History of falling: Secondary | ICD-10-CM | POA: Diagnosis not present

## 2021-10-29 DIAGNOSIS — Z86711 Personal history of pulmonary embolism: Secondary | ICD-10-CM | POA: Diagnosis not present

## 2021-10-31 ENCOUNTER — Other Ambulatory Visit: Payer: Self-pay

## 2021-10-31 ENCOUNTER — Telehealth: Payer: Self-pay

## 2021-10-31 DIAGNOSIS — Z87891 Personal history of nicotine dependence: Secondary | ICD-10-CM | POA: Diagnosis not present

## 2021-10-31 DIAGNOSIS — C20 Malignant neoplasm of rectum: Secondary | ICD-10-CM

## 2021-10-31 DIAGNOSIS — I825Z3 Chronic embolism and thrombosis of unspecified deep veins of distal lower extremity, bilateral: Secondary | ICD-10-CM | POA: Diagnosis not present

## 2021-10-31 DIAGNOSIS — E876 Hypokalemia: Secondary | ICD-10-CM | POA: Diagnosis not present

## 2021-10-31 DIAGNOSIS — I129 Hypertensive chronic kidney disease with stage 1 through stage 4 chronic kidney disease, or unspecified chronic kidney disease: Secondary | ICD-10-CM | POA: Diagnosis not present

## 2021-10-31 DIAGNOSIS — Z9181 History of falling: Secondary | ICD-10-CM | POA: Diagnosis not present

## 2021-10-31 DIAGNOSIS — T451X5D Adverse effect of antineoplastic and immunosuppressive drugs, subsequent encounter: Secondary | ICD-10-CM | POA: Diagnosis not present

## 2021-10-31 DIAGNOSIS — Z86711 Personal history of pulmonary embolism: Secondary | ICD-10-CM | POA: Diagnosis not present

## 2021-10-31 DIAGNOSIS — C799 Secondary malignant neoplasm of unspecified site: Secondary | ICD-10-CM

## 2021-10-31 DIAGNOSIS — Z4800 Encounter for change or removal of nonsurgical wound dressing: Secondary | ICD-10-CM | POA: Diagnosis not present

## 2021-10-31 DIAGNOSIS — K219 Gastro-esophageal reflux disease without esophagitis: Secondary | ICD-10-CM | POA: Diagnosis not present

## 2021-10-31 DIAGNOSIS — N182 Chronic kidney disease, stage 2 (mild): Secondary | ICD-10-CM | POA: Diagnosis not present

## 2021-10-31 DIAGNOSIS — E782 Mixed hyperlipidemia: Secondary | ICD-10-CM | POA: Diagnosis not present

## 2021-10-31 DIAGNOSIS — Z7901 Long term (current) use of anticoagulants: Secondary | ICD-10-CM | POA: Diagnosis not present

## 2021-10-31 DIAGNOSIS — C7982 Secondary malignant neoplasm of genital organs: Secondary | ICD-10-CM | POA: Diagnosis not present

## 2021-10-31 DIAGNOSIS — L89312 Pressure ulcer of right buttock, stage 2: Secondary | ICD-10-CM | POA: Diagnosis not present

## 2021-10-31 DIAGNOSIS — Z79899 Other long term (current) drug therapy: Secondary | ICD-10-CM | POA: Diagnosis not present

## 2021-10-31 DIAGNOSIS — Z933 Colostomy status: Secondary | ICD-10-CM | POA: Diagnosis not present

## 2021-10-31 DIAGNOSIS — M1990 Unspecified osteoarthritis, unspecified site: Secondary | ICD-10-CM | POA: Diagnosis not present

## 2021-10-31 DIAGNOSIS — D701 Agranulocytosis secondary to cancer chemotherapy: Secondary | ICD-10-CM | POA: Diagnosis not present

## 2021-10-31 NOTE — Telephone Encounter (Signed)
-----   Message from Earlie Server, MD sent at 10/29/2021 11:04 AM EST ----- CEA is trending up. Please arrange her to get CT chest abdomen pelvis with contrast during week of 1/16 May order as Stat if no availability.

## 2021-10-31 NOTE — Telephone Encounter (Signed)
Pt scheduled on 1/17. Dr. Tasia Catchings can you please sign order.

## 2021-10-31 NOTE — Telephone Encounter (Signed)
Spoke to pt regarding obtaining CT. She is agreeable and is available today after 12p if availability today.

## 2021-11-01 ENCOUNTER — Ambulatory Visit
Admission: RE | Admit: 2021-11-01 | Discharge: 2021-11-01 | Disposition: A | Discharge status: Home / Self Care | Payer: Medicare Other | Source: Ambulatory Visit | Attending: Oncology | Admitting: Oncology

## 2021-11-01 ENCOUNTER — Telehealth: Payer: Self-pay | Admitting: *Deleted

## 2021-11-01 DIAGNOSIS — C20 Malignant neoplasm of rectum: Secondary | ICD-10-CM | POA: Diagnosis not present

## 2021-11-01 DIAGNOSIS — M47816 Spondylosis without myelopathy or radiculopathy, lumbar region: Secondary | ICD-10-CM | POA: Diagnosis not present

## 2021-11-01 DIAGNOSIS — C799 Secondary malignant neoplasm of unspecified site: Secondary | ICD-10-CM | POA: Insufficient documentation

## 2021-11-01 DIAGNOSIS — I2510 Atherosclerotic heart disease of native coronary artery without angina pectoris: Secondary | ICD-10-CM | POA: Diagnosis not present

## 2021-11-01 DIAGNOSIS — N281 Cyst of kidney, acquired: Secondary | ICD-10-CM | POA: Diagnosis not present

## 2021-11-01 DIAGNOSIS — K8689 Other specified diseases of pancreas: Secondary | ICD-10-CM | POA: Diagnosis not present

## 2021-11-01 DIAGNOSIS — I700 Atherosclerosis of aorta: Secondary | ICD-10-CM | POA: Diagnosis not present

## 2021-11-01 MED ORDER — IOHEXOL 300 MG/ML  SOLN
100.0000 mL | Freq: Once | INTRAMUSCULAR | Status: AC | PRN
  Administered 2021-11-01: 100 mL via INTRAVENOUS

## 2021-11-01 NOTE — Telephone Encounter (Signed)
FMLA forms for patient's daughter completed- pending md signature. 11/01/21

## 2021-11-02 DIAGNOSIS — Z87891 Personal history of nicotine dependence: Secondary | ICD-10-CM | POA: Diagnosis not present

## 2021-11-02 DIAGNOSIS — C799 Secondary malignant neoplasm of unspecified site: Secondary | ICD-10-CM | POA: Diagnosis not present

## 2021-11-02 DIAGNOSIS — Z79899 Other long term (current) drug therapy: Secondary | ICD-10-CM | POA: Diagnosis not present

## 2021-11-02 DIAGNOSIS — C7982 Secondary malignant neoplasm of genital organs: Secondary | ICD-10-CM | POA: Diagnosis not present

## 2021-11-02 DIAGNOSIS — E876 Hypokalemia: Secondary | ICD-10-CM | POA: Diagnosis not present

## 2021-11-02 DIAGNOSIS — D701 Agranulocytosis secondary to cancer chemotherapy: Secondary | ICD-10-CM | POA: Diagnosis not present

## 2021-11-02 DIAGNOSIS — Z7901 Long term (current) use of anticoagulants: Secondary | ICD-10-CM | POA: Diagnosis not present

## 2021-11-02 DIAGNOSIS — E782 Mixed hyperlipidemia: Secondary | ICD-10-CM | POA: Diagnosis not present

## 2021-11-02 DIAGNOSIS — C20 Malignant neoplasm of rectum: Secondary | ICD-10-CM | POA: Diagnosis not present

## 2021-11-02 DIAGNOSIS — T451X5D Adverse effect of antineoplastic and immunosuppressive drugs, subsequent encounter: Secondary | ICD-10-CM | POA: Diagnosis not present

## 2021-11-02 DIAGNOSIS — Z9181 History of falling: Secondary | ICD-10-CM | POA: Diagnosis not present

## 2021-11-02 DIAGNOSIS — M1990 Unspecified osteoarthritis, unspecified site: Secondary | ICD-10-CM | POA: Diagnosis not present

## 2021-11-02 DIAGNOSIS — K219 Gastro-esophageal reflux disease without esophagitis: Secondary | ICD-10-CM | POA: Diagnosis not present

## 2021-11-02 DIAGNOSIS — Z933 Colostomy status: Secondary | ICD-10-CM | POA: Diagnosis not present

## 2021-11-02 DIAGNOSIS — Z86711 Personal history of pulmonary embolism: Secondary | ICD-10-CM | POA: Diagnosis not present

## 2021-11-02 DIAGNOSIS — L89312 Pressure ulcer of right buttock, stage 2: Secondary | ICD-10-CM | POA: Diagnosis not present

## 2021-11-02 DIAGNOSIS — Z4800 Encounter for change or removal of nonsurgical wound dressing: Secondary | ICD-10-CM | POA: Diagnosis not present

## 2021-11-02 DIAGNOSIS — I129 Hypertensive chronic kidney disease with stage 1 through stage 4 chronic kidney disease, or unspecified chronic kidney disease: Secondary | ICD-10-CM | POA: Diagnosis not present

## 2021-11-02 DIAGNOSIS — I825Z3 Chronic embolism and thrombosis of unspecified deep veins of distal lower extremity, bilateral: Secondary | ICD-10-CM | POA: Diagnosis not present

## 2021-11-02 DIAGNOSIS — N182 Chronic kidney disease, stage 2 (mild): Secondary | ICD-10-CM | POA: Diagnosis not present

## 2021-11-03 NOTE — Telephone Encounter (Signed)
FMLA faxed to [employer]. Fax confirmation rcvd. Copy of original form sent to HIM dept to be scanned into patients chart.

## 2021-11-04 ENCOUNTER — Encounter: Payer: Self-pay | Admitting: Oncology

## 2021-11-08 ENCOUNTER — Ambulatory Visit: Payer: Medicare Other | Admitting: Podiatry

## 2021-11-08 DIAGNOSIS — I129 Hypertensive chronic kidney disease with stage 1 through stage 4 chronic kidney disease, or unspecified chronic kidney disease: Secondary | ICD-10-CM | POA: Diagnosis not present

## 2021-11-08 DIAGNOSIS — K219 Gastro-esophageal reflux disease without esophagitis: Secondary | ICD-10-CM | POA: Diagnosis not present

## 2021-11-08 DIAGNOSIS — C7982 Secondary malignant neoplasm of genital organs: Secondary | ICD-10-CM | POA: Diagnosis not present

## 2021-11-08 DIAGNOSIS — T451X5D Adverse effect of antineoplastic and immunosuppressive drugs, subsequent encounter: Secondary | ICD-10-CM | POA: Diagnosis not present

## 2021-11-08 DIAGNOSIS — Z933 Colostomy status: Secondary | ICD-10-CM | POA: Diagnosis not present

## 2021-11-08 DIAGNOSIS — Z87891 Personal history of nicotine dependence: Secondary | ICD-10-CM | POA: Diagnosis not present

## 2021-11-08 DIAGNOSIS — M1990 Unspecified osteoarthritis, unspecified site: Secondary | ICD-10-CM | POA: Diagnosis not present

## 2021-11-08 DIAGNOSIS — E876 Hypokalemia: Secondary | ICD-10-CM | POA: Diagnosis not present

## 2021-11-08 DIAGNOSIS — C20 Malignant neoplasm of rectum: Secondary | ICD-10-CM | POA: Diagnosis not present

## 2021-11-08 DIAGNOSIS — Z79899 Other long term (current) drug therapy: Secondary | ICD-10-CM | POA: Diagnosis not present

## 2021-11-08 DIAGNOSIS — Z86711 Personal history of pulmonary embolism: Secondary | ICD-10-CM | POA: Diagnosis not present

## 2021-11-08 DIAGNOSIS — L89312 Pressure ulcer of right buttock, stage 2: Secondary | ICD-10-CM | POA: Diagnosis not present

## 2021-11-08 DIAGNOSIS — I825Z3 Chronic embolism and thrombosis of unspecified deep veins of distal lower extremity, bilateral: Secondary | ICD-10-CM | POA: Diagnosis not present

## 2021-11-08 DIAGNOSIS — Z7901 Long term (current) use of anticoagulants: Secondary | ICD-10-CM | POA: Diagnosis not present

## 2021-11-08 DIAGNOSIS — Z4800 Encounter for change or removal of nonsurgical wound dressing: Secondary | ICD-10-CM | POA: Diagnosis not present

## 2021-11-08 DIAGNOSIS — N182 Chronic kidney disease, stage 2 (mild): Secondary | ICD-10-CM | POA: Diagnosis not present

## 2021-11-08 DIAGNOSIS — D701 Agranulocytosis secondary to cancer chemotherapy: Secondary | ICD-10-CM | POA: Diagnosis not present

## 2021-11-08 DIAGNOSIS — Z9181 History of falling: Secondary | ICD-10-CM | POA: Diagnosis not present

## 2021-11-08 DIAGNOSIS — E782 Mixed hyperlipidemia: Secondary | ICD-10-CM | POA: Diagnosis not present

## 2021-11-09 ENCOUNTER — Inpatient Hospital Stay (HOSPITAL_BASED_OUTPATIENT_CLINIC_OR_DEPARTMENT_OTHER): Payer: Medicare Other | Admitting: Oncology

## 2021-11-09 ENCOUNTER — Encounter: Payer: Self-pay | Admitting: Oncology

## 2021-11-09 ENCOUNTER — Inpatient Hospital Stay: Payer: Medicare Other

## 2021-11-09 ENCOUNTER — Other Ambulatory Visit: Payer: Self-pay

## 2021-11-09 VITALS — BP 124/70 | HR 87 | Temp 97.6°F | Wt 162.0 lb

## 2021-11-09 VITALS — BP 110/78 | HR 68 | Resp 16

## 2021-11-09 DIAGNOSIS — Z86711 Personal history of pulmonary embolism: Secondary | ICD-10-CM

## 2021-11-09 DIAGNOSIS — Z86718 Personal history of other venous thrombosis and embolism: Secondary | ICD-10-CM | POA: Diagnosis not present

## 2021-11-09 DIAGNOSIS — Z5111 Encounter for antineoplastic chemotherapy: Secondary | ICD-10-CM | POA: Diagnosis not present

## 2021-11-09 DIAGNOSIS — T451X5A Adverse effect of antineoplastic and immunosuppressive drugs, initial encounter: Secondary | ICD-10-CM | POA: Diagnosis not present

## 2021-11-09 DIAGNOSIS — C774 Secondary and unspecified malignant neoplasm of inguinal and lower limb lymph nodes: Secondary | ICD-10-CM | POA: Diagnosis not present

## 2021-11-09 DIAGNOSIS — C799 Secondary malignant neoplasm of unspecified site: Secondary | ICD-10-CM

## 2021-11-09 DIAGNOSIS — Z87891 Personal history of nicotine dependence: Secondary | ICD-10-CM | POA: Diagnosis not present

## 2021-11-09 DIAGNOSIS — Z7901 Long term (current) use of anticoagulants: Secondary | ICD-10-CM | POA: Diagnosis not present

## 2021-11-09 DIAGNOSIS — E876 Hypokalemia: Secondary | ICD-10-CM | POA: Diagnosis not present

## 2021-11-09 DIAGNOSIS — Z5112 Encounter for antineoplastic immunotherapy: Secondary | ICD-10-CM | POA: Diagnosis not present

## 2021-11-09 DIAGNOSIS — D701 Agranulocytosis secondary to cancer chemotherapy: Secondary | ICD-10-CM

## 2021-11-09 DIAGNOSIS — C20 Malignant neoplasm of rectum: Secondary | ICD-10-CM | POA: Diagnosis not present

## 2021-11-09 DIAGNOSIS — Z79899 Other long term (current) drug therapy: Secondary | ICD-10-CM | POA: Diagnosis not present

## 2021-11-09 DIAGNOSIS — Z5189 Encounter for other specified aftercare: Secondary | ICD-10-CM | POA: Diagnosis not present

## 2021-11-09 LAB — COMPREHENSIVE METABOLIC PANEL
ALT: 16 U/L (ref 0–44)
AST: 26 U/L (ref 15–41)
Albumin: 4.1 g/dL (ref 3.5–5.0)
Alkaline Phosphatase: 62 U/L (ref 38–126)
Anion gap: 6 (ref 5–15)
BUN: 9 mg/dL (ref 8–23)
CO2: 30 mmol/L (ref 22–32)
Calcium: 9.6 mg/dL (ref 8.9–10.3)
Chloride: 103 mmol/L (ref 98–111)
Creatinine, Ser: 0.81 mg/dL (ref 0.44–1.00)
GFR, Estimated: >60 mL/min (ref >60)
Glucose, Bld: 109 mg/dL — ABNORMAL HIGH (ref 70–99)
Potassium: 4.1 mmol/L (ref 3.5–5.1)
Sodium: 139 mmol/L (ref 135–145)
Total Bilirubin: 0.3 mg/dL (ref 0.3–1.2)
Total Protein: 6.6 g/dL (ref 6.5–8.1)

## 2021-11-09 LAB — CBC WITH DIFFERENTIAL/PLATELET
Abs Immature Granulocytes: 0.03 10*3/uL (ref 0.00–0.07)
Basophils Absolute: 0 10*3/uL (ref 0.0–0.1)
Basophils Relative: 0 %
Eosinophils Absolute: 0.1 10*3/uL (ref 0.0–0.5)
Eosinophils Relative: 2 %
HCT: 36.9 % (ref 36.0–46.0)
Hemoglobin: 11.5 g/dL — ABNORMAL LOW (ref 12.0–15.0)
Immature Granulocytes: 1 %
Lymphocytes Relative: 24 %
Lymphs Abs: 1.2 10*3/uL (ref 0.7–4.0)
MCH: 30.7 pg (ref 26.0–34.0)
MCHC: 31.2 g/dL (ref 30.0–36.0)
MCV: 98.7 fL (ref 80.0–100.0)
Monocytes Absolute: 0.4 10*3/uL (ref 0.1–1.0)
Monocytes Relative: 7 %
Neutro Abs: 3.1 10*3/uL (ref 1.7–7.7)
Neutrophils Relative %: 66 %
Platelets: 156 10*3/uL (ref 150–400)
RBC: 3.74 MIL/uL — ABNORMAL LOW (ref 3.87–5.11)
RDW: 17.5 % — ABNORMAL HIGH (ref 11.5–15.5)
WBC: 4.8 10*3/uL (ref 4.0–10.5)
nRBC: 0 % (ref 0.0–0.2)

## 2021-11-09 LAB — PROTEIN, URINE, RANDOM: Total Protein, Urine: 16 mg/dL

## 2021-11-09 MED ORDER — SODIUM CHLORIDE 0.9 % IV SOLN
350.0000 mg | Freq: Once | INTRAVENOUS | Status: AC
  Administered 2021-11-09: 11:00:00 350 mg via INTRAVENOUS
  Filled 2021-11-09: qty 14

## 2021-11-09 MED ORDER — PALONOSETRON HCL INJECTION 0.25 MG/5ML
0.2500 mg | Freq: Once | INTRAVENOUS | Status: AC
  Administered 2021-11-09: 10:00:00 0.25 mg via INTRAVENOUS
  Filled 2021-11-09: qty 5

## 2021-11-09 MED ORDER — SODIUM CHLORIDE 0.9 % IV SOLN
2400.0000 mg/m2 | INTRAVENOUS | Status: DC
  Administered 2021-11-09: 13:00:00 4300 mg via INTRAVENOUS
  Filled 2021-11-09: qty 86

## 2021-11-09 MED ORDER — SODIUM CHLORIDE 0.9 % IV SOLN
10.0000 mg | Freq: Once | INTRAVENOUS | Status: AC
  Administered 2021-11-09: 10:00:00 10 mg via INTRAVENOUS
  Filled 2021-11-09: qty 10

## 2021-11-09 MED ORDER — SODIUM CHLORIDE 0.9 % IV SOLN
750.0000 mg | Freq: Once | INTRAVENOUS | Status: AC
  Administered 2021-11-09: 11:00:00 750 mg via INTRAVENOUS
  Filled 2021-11-09: qty 37.5

## 2021-11-09 MED ORDER — SODIUM CHLORIDE 0.9 % IV SOLN
75.0000 mg/m2 | Freq: Once | INTRAVENOUS | Status: AC
  Administered 2021-11-09: 11:00:00 140 mg via INTRAVENOUS
  Filled 2021-11-09: qty 5

## 2021-11-09 MED ORDER — DIPHENHYDRAMINE HCL 50 MG/ML IJ SOLN
25.0000 mg | Freq: Once | INTRAMUSCULAR | Status: AC
  Administered 2021-11-09: 13:00:00 25 mg via INTRAVENOUS
  Filled 2021-11-09: qty 1

## 2021-11-09 MED ORDER — ATROPINE SULFATE 1 MG/ML IV SOLN
0.4000 mg | Freq: Once | INTRAVENOUS | Status: AC
  Administered 2021-11-09: 10:00:00 0.4 mg via INTRAVENOUS
  Filled 2021-11-09: qty 1

## 2021-11-09 MED ORDER — DIPHENHYDRAMINE HCL 50 MG/ML IJ SOLN
25.0000 mg | Freq: Every day | INTRAMUSCULAR | Status: DC | PRN
  Administered 2021-11-09: 10:00:00 25 mg via INTRAVENOUS
  Filled 2021-11-09: qty 1

## 2021-11-09 MED ORDER — SODIUM CHLORIDE 0.9 % IV SOLN
Freq: Once | INTRAVENOUS | Status: AC
  Filled 2021-11-09: qty 250

## 2021-11-09 NOTE — Progress Notes (Signed)
Hematology/Oncology progress note [phone] [phone]   Patient Care Team: Olin Hauser, DO as PCP - General (Family Medicine) Clent Jacks, RN as Registered Nurse Earlie Server, MD as Consulting Physician (Hematology and Oncology)  CHIEF COMPLAINTS/REASON FOR VISIT:  Follow up for rectal cancer  HISTORY OF PRESENTING ILLNESS:  Patient initially presented with complaints of postmenopausal bleeding on 08/16/2018.  History of was menopausal vaginal bleeding in 2016 which resulted in cervical polypectomy.  Pathology 02/04/2015 showed cervical polyp, consistent with benign endometrial polyp.  [name] after polypectomy due to anxiety associated with pelvic exams.  pelvic exam on 08/16/2018 reviewed cervical abnormality and from enlarged uterus. Seen by Dr. Marcelline Mates on 10/29/2018.  Endometrial biopsy and a Pap smear was performed. 10/29/2018 Pap smear showed adenocarcinoma, favor endometrial origin. 10/29/2018 endometrial biopsy showed endometrioid carcinoma, FIGO grade 1.  10/29/2018- TA & TV Ultrasound revealed: Anteverted uterus measuring 8.7 x 5.6 x 6.4 cm without evidence of focal masses.  The endometrium measuring 24.1 mm (thickened) and heterogeneous.  Right and left ovaries not visualized.  No adnexal masses identified.  No free fluid in cul-de-sac.  Patient was seen by Dr. Theora Gianotti in clinic on 11/13/2018.  Cervical exam reveals 2 cm exophytic irregular mass consistent with malignancy.   11/19/2018 CT chest abdomen pelvis with contrast showed thickened endometrium with some irregularity compatible with the provided diagnosis of endometrial malignancy.  There is a mildly prominent left inguinal node 1.4 cm.  Patient was seen by Dr. Fransisca Connors on 11/20/2018 and left groin lymph node biopsy was recommended.  11/26/2018 patient underwent left inguinal lymph node biopsy. Pathology showed metastatic adenocarcinoma consistent with colorectal origin.  CDX  2+.  Case was discussed on tumor board.  Recommend colonoscopy for further evaluation.  Patient reports significant weight loss 30 pounds over the last year.  Chronic vaginal spotting. Change of bowel habits the past few months.  More constipated.  Family history positive for brother who has colon cancer prostate cancer.  patient has underwent colonoscopy on 12/03/2018 which reviewed a nonobstructing large mass in the rectum.  Also chronic fistula.  Mass was not circumferential.  This was biopsied with a cold forceps for histology.  Pathology came back hyperplastic polyp negative for dysplasia and malignancy. [name], patient underwent flex sigmoidoscopy on 12/06/2018 with rebiopsy of the rectal mass. This time biopsy results came back positive for invasive colorectal adenocarcinoma, moderately differentiated. Immunotherapy for nearly mismatch repair protein (MMR ) was performed.  There is no loss of MMR expression.  low probability of MSI high.   # Seen by [name] for evaluation of resectability for rectal cancer. In addition, she also had a second opinion with [facility] where her endometrial biopsy pathology was changed to  adenocarcinoma, consistent with colorectal primary.   [name]. She has home health that has been assisting with ostomy care  Patient was also evaluated by [facility] oncology.  Recommendation is to proceed with TNT with concurrent chemoradiation followed by neoadjuvant chemotherapy followed by surgical resection. Patient prefers to have treatment done locally with [facility].   # Oncology Treatment:  02/03/2019- 03/19/2019  concurrent Xeloda and radiation.  Xeloda dose 816m /m2 BID - rounded to 1652mBID- on days of radiation. 04/09/2019, started on FOLFOX with bolus early.  Omitted.  07/16/2019 finished 8 cycles of FOLFOX. 09/17/19 APR/posterior vaginectomy/TAH/BSO/VY-flap pT4b pN0 with close vaginal margin 0.2  mm.  Uterus and ovaries negative for malignancy. Patient  reports bilateral lower extremity numbness and tingling, intermittent, left worse than right. She has lost a lot of weight since her APR surgery.   #Family history with half brother having's history of colon cancer prostate cancer.  Personal history of colorectal cancer.  Patient has not decided if she wants genetic testing.    # history of PE( 01/13/2020)  in the bilateral lower extremity DVT (01/13/2020).   She finishes 6 months of anticoagulation with Eliquis 5 mg twice daily. Now switched to Eliquis 2.5 mg twice daily..  # She has now developed recurrent disease. #06/30/20  vaginal introitus mass biopsied. Pathology is consistent with metastatic colorectal adenocarcinoma I have discussed with [name]  Dr. Hester Mates and the mass is not resectable. Patient has also had colonoscopy by Dr. Vicente Males yesterday. Normal examination. # 07/16/2020 cycle 1 FOLFIRI  # 07/20/2020 PET scan was done for further evaluation, images are consistent with local recurrence, no distant metastasis. #Discussed with radiation oncology Dr. Baruch Gouty will recommends concurrent chemotherapy and radiation. 08/02/2020-08/16/2020, patient starts radiation.  Xeloda was held due to neutropenia 08/17/2020,-09/06/2020 Xeloda 1500 mg twice daily concurrently with radiation  01/31/21 started on FOLFIRI + Bev 05/18/2021 CT chest abdomen pelvis showed Previously noted enlargement of bilateral inguinal lymph nodes is resolved, consistent with treatment response of nodal metastatic disease. Interval decrease in size of multiple small bilateral pulmonary nodules, consistent with treatment response of pulmonary metastatic disease. No evidence of new metastatic disease. 05/24/2021 - 08/30/2021, continued on FOLFIRI plus bevacizumab.  Irinotecan dose was reduced, eventually 100mg /m2  09/02/2021, CT chest abdomen pelvis without contrast Showed small bilateral pulmonary nodules, unchanged.   Stable metastatic disease.  No noncontrast evidence of new metastatic disease in the chest abdomen pelvis.  Small parastomal hernia.  Enlargement of main pulmonary artery.  Coronary artery disease.  09/13/2021, maintenance 5-FU/bevacizumab   INTERVAL HISTORY Jacqueline Yoder is a 76 y.o. female who has above history reviewed by me presents for follow-up of rectal cancer. Patient reports feeling well.  Company by daughter. Patient had CT scan done during interval.   Review of Systems  Constitutional:  Positive for fatigue. Negative for appetite change, chills, fever and unexpected weight change.  HENT:   Negative for hearing loss and voice change.   Eyes:  Negative for eye problems.  Respiratory:  Negative for chest tightness and cough.   Cardiovascular:  Negative for chest pain.  Gastrointestinal:  Negative for abdominal distention, abdominal pain, blood in stool, constipation, diarrhea and nausea.  Endocrine: Negative for hot flashes.  Genitourinary:  Negative for difficulty urinating and frequency.   Musculoskeletal:  Negative for arthralgias.  Skin:  Negative for itching and rash.  Neurological:  Negative for extremity weakness and numbness.  Hematological:  Negative for adenopathy.  Psychiatric/Behavioral:  Negative for confusion.    MEDICAL HISTORY:  Past Medical History:  Diagnosis Date   Allergy    Arthritis    Blood clot in vein    Family history of colon cancer    GERD (gastroesophageal reflux disease)    Hypercholesteremia    Hypertension    Hypertension    Lower extremity edema    Rectal cancer ([place]) 12/2018   Urinary incontinence     SURGICAL HISTORY: Past Surgical History:  Procedure Laterality Date   ABDOMINAL HYSTERECTOMY     CHOLECYSTECTOMY  1971   COLONOSCOPY WITH PROPOFOL N/A 12/03/2018   Procedure: COLONOSCOPY WITH PROPOFOL;  Surgeon: Lucilla Lame, MD;  Location: [facility] ENDOSCOPY;  Service: Endoscopy;  Laterality: N/A;  COLONOSCOPY WITH PROPOFOL N/A  07/15/2020   Procedure: COLONOSCOPY WITH PROPOFOL;  Surgeon: Wyline Mood, MD;  Location: [facility] ENDOSCOPY;  Service: Gastroenterology;  Laterality: N/A;   FLEXIBLE SIGMOIDOSCOPY N/A 12/06/2018   Procedure: FLEXIBLE SIGMOIDOSCOPY;  Surgeon: Wyline Mood, MD;  Location: [facility] ENDOSCOPY;  Service: Endoscopy;  Laterality: N/A;   LAPAROSCOPIC COLOSTOMY  01/06/2019   PORTACATH PLACEMENT N/A 04/03/2019   Procedure: INSERTION PORT-A-CATH;  Surgeon: Leafy Ro, MD;  Location: [facility] ORS;  Service: General;  Laterality: N/A;    SOCIAL HISTORY: Social History   Socioeconomic History   Marital status: Widowed    Spouse name: Not on file   Number of children: Not on file   Years of education: Not on file   Highest education level: Not on file  Occupational History   Not on file  Tobacco Use   Smoking status: Former    Types: Cigarettes    Quit date: 12/02/1977    Years since quitting: 43.9   Smokeless tobacco: Former  Building services engineer Use: Never used  Substance and Sexual Activity   Alcohol use: Never   Drug use: Never   Sexual activity: Not Currently    Birth control/protection: None  Other Topics Concern   Not on file  Social History Narrative   Lives with daughter   Social Determinants of Health   Financial Resource Strain: Low Risk    Difficulty of Paying Living Expenses: Not hard at all  Food Insecurity: No Food Insecurity   Worried About Programme researcher, broadcasting/film/video in the Last Year: Never true   Ran Out of Food in the Last Year: Never true  Transportation Needs: No Transportation Needs   Lack of Transportation (Medical): No   Lack of Transportation (Non-Medical): No  Physical Activity: Inactive   Days of Exercise per Week: 0 days   Minutes of Exercise per Session: 0 min  Stress: No Stress Concern Present   Feeling of Stress : Not at all  Social Connections: Not on file  Intimate Partner Violence: Not on file    FAMILY HISTORY: Family History  Problem Relation Age of Onset    Colon cancer Brother 82       exposure to chemicals [place]   Hypertension Mother    Stroke Mother    Kidney failure Father    Breast cancer Neg Hx    Ovarian cancer Neg Hx     ALLERGIES:  is allergic to sulfamethoxazole-trimethoprim.  MEDICATIONS:  Current Outpatient Medications  Medication Sig Dispense Refill   apixaban (ELIQUIS) 2.5 MG TABS tablet Take 1 tablet (2.5 mg total) by mouth 2 (two) times daily. 60 tablet 2   Cholecalciferol (VITAMIN D3) 2000 units capsule Take 2,000 Units by mouth daily.     diclofenac sodium (VOLTAREN) 1 % GEL Apply 2 g topically 4 (four) times daily as needed (joint pain).  11   fluticasone (FLONASE) 50 MCG/ACT nasal spray Use 1 spray(s) in each nostril once daily 16 g 1   gabapentin (NEURONTIN) 100 MG capsule Take 1 capsule (100 mg total) by mouth at bedtime. 30 capsule 0   ipratropium (ATROVENT) 0.03 % nasal spray Place 1 spray into both nostrils 2 (two) times daily. 30 mL 2   lidocaine-prilocaine (EMLA) cream Apply 1 application topically as needed. 30 g 6   loratadine (CLARITIN) 10 MG tablet Take 10 mg by mouth daily.     Multiple Vitamins-Minerals (ONE-A-DAY WOMENS 50 PLUS PO) Take 1 tablet by mouth daily.  ondansetron (ZOFRAN) 8 MG tablet Take 1 tablet (8 mg total) by mouth 2 (two) times daily as needed for refractory nausea / vomiting. Start on day 3 after chemotherapy. 30 tablet 1   potassium chloride SA (KLOR-CON M) 20 MEQ tablet Take 2 tablets (40 mEq total) by mouth 2 (two) times daily. 120 tablet 1   prochlorperazine (COMPAZINE) 10 MG tablet Take 1 tablet (10 mg total) by mouth every 6 (six) hours as needed (NAUSEA). 30 tablet 1   simvastatin (ZOCOR) 40 MG tablet Take 1 tablet (40 mg total) by mouth at bedtime. 90 tablet 3   triamterene-hydrochlorothiazide (DYAZIDE) 37.5-25 MG capsule Take 1 each (1 capsule total) by mouth daily. 90 capsule 3   zinc gluconate 50 MG tablet Take 50 mg by mouth daily.     diphenoxylate-atropine (LOMOTIL)  2.5-0.025 MG tablet Take 1 tablet by mouth 4 (four) times daily as needed for diarrhea or loose stools. (Patient not taking: Reported on 09/27/2021) 30 tablet 1   loperamide (IMODIUM) 2 MG capsule Take 1 capsule (2 mg total) by mouth See admin instructions. With onset of loose stool, take $RemoveBef'4mg'TIjYuNblBV$  followed by $RemoveBef'2mg'MCEMnfZNJC$  every 2 hours,  Maximum: 16 mg/day (Patient not taking: Reported on 09/27/2021) 120 capsule 1   No current facility-administered medications for this visit.     PHYSICAL EXAMINATION: ECOG PERFORMANCE STATUS: 1 - Symptomatic but completely ambulatory  Physical Exam Constitutional:      General: She is not in acute distress. HENT:     Head: Normocephalic and atraumatic.  Eyes:     General: No scleral icterus. Cardiovascular:     Rate and Rhythm: Normal rate and regular rhythm.     Heart sounds: Normal heart sounds.  Pulmonary:     Effort: Pulmonary effort is normal. No respiratory distress.     Breath sounds: No wheezing.  Abdominal:     General: Bowel sounds are normal. There is no distension.     Palpations: Abdomen is soft.     Comments: + Colostomy bag   Musculoskeletal:        General: No deformity. Normal range of motion.     Cervical back: Normal range of motion and neck supple.  Skin:    General: Skin is warm and dry.     Findings: No erythema or rash.  Neurological:     Mental Status: She is alert and oriented to person, place, and time. Mental status is at baseline.     Cranial Nerves: No cranial nerve deficit.     Coordination: Coordination normal.      LABORATORY DATA:  I have reviewed the data as listed Lab Results  Component Value Date   WBC 4.8 11/09/2021   HGB 11.5 (L) 11/09/2021   HCT 36.9 11/09/2021   MCV 98.7 11/09/2021   PLT 156 11/09/2021   Recent Labs    09/27/21 0844 10/11/21 0816 10/24/21 0857  NA 139 135 139  K 4.0 4.0 4.0  CL 105 101 103  CO2 $Re'26 27 29  'iLr$ GLUCOSE 96 99 75  BUN $Re'16 14 11  'mmP$ CREATININE 0.89 0.74 0.96  CALCIUM 9.3 9.3  9.6  GFRNONAA >60 >60 >60  PROT 6.5 6.7 6.8  ALBUMIN 3.7 3.9 4.0  AST $Re'24 23 30  'rRr$ ALT $R'16 14 19  'VV$ ALKPHOS 53 46 49  BILITOT 0.5 0.3 0.8    Iron/TIBC/Ferritin/ %Sat    Component Value Date/Time   IRON 40 06/23/2020 1124   TIBC 246 (L) 06/23/2020 1124  FERRITIN 338 (H) 06/23/2020 1124   IRONPCTSAT 16 06/23/2020 1124     RADIOGRAPHIC STUDIES: I have personally reviewed the radiological images as listed and agreed with the findings in the report. CT CHEST ABDOMEN PELVIS W CONTRAST  Result Date: 11/01/2021 CLINICAL DATA:  Restaging rectal cancer, elevated CEA level. Prior APR. EXAM: CT CHEST, ABDOMEN, AND PELVIS WITH CONTRAST TECHNIQUE: Multidetector CT imaging of the chest, abdomen and pelvis was performed following the standard protocol during bolus administration of intravenous contrast. RADIATION DOSE REDUCTION: This exam was performed according to the departmental dose-optimization program which includes automated exposure control, adjustment of the mA and/or kV according to patient size and/or use of iterative reconstruction technique. CONTRAST:  116mL OMNIPAQUE IOHEXOL 300 MG/ML  SOLN COMPARISON:  Multiple exams, including 09/01/2021 FINDINGS: CT CHEST FINDINGS Cardiovascular: Right Port-A-Cath tip: Right atrium. Coronary, aortic arch, and branch vessel atherosclerotic vascular disease. Stable prominence of the main pulmonary artery. Mediastinum/Nodes: No pathologic adenopathy identified. Lungs/Pleura: Index right middle lobe nodule measures 6 by 4 by 6 mm on image 97 series 4, previously 6 by 4 by 6 mm by my measurements, stable. A 3 by 3 mm left upper lobe nodule on image 44 series 4 previously measured 2 by 3 mm. Other scattered tiny pulmonary nodules are stable. Musculoskeletal: Unremarkable CT ABDOMEN PELVIS FINDINGS Hepatobiliary: Prior cholecystectomy. Mild to moderate intrahepatic biliary dilatation. Common bile duct 8 mm in diameter. Overall appearance similar to prior. No well-defined  focal hepatic metastatic lesion. Pancreas: Pancreatic duct dilatation, measuring 3.5 mm in the pancreatic body and 5 mm in diameter in the pancreatic head near the ampulla. This is similar to prior exams such as 05/18/2021. No focal pancreatic lesion identified. Spleen: Unremarkable Adrenals/Urinary Tract: Bilateral renal cysts are again observed. The adrenal glands appear normal. Stomach/Bowel: Prior APR with left colostomy. Normal appendix. No dilated bowel. As on the prior exam there is some prominence of fatty tissues along the stoma suggesting peristomal herniation of omental adipose tissue but no herniation of bowel pelvic floor laxity along the APR with bulging of tissues including loops of small bowel on image 113 series 6. Vascular/Lymphatic: Atherosclerosis is present, including aortoiliac atherosclerotic disease. No pathologic adenopathy observed. Reproductive: Uterus absent.  Pelvic floor laxity. Other: No supplemental non-categorized findings. Musculoskeletal: Lumbar spondylosis and degenerative disc disease. IMPRESSION: 1. Most of the small pulmonary nodules are stable compared to the prior exam, including the index 6 by 4 by 6 mm right middle lobe nodule. A 3 by 3 mm left upper lobe nodule previously measured 2 by 3 mm and may merit attention on surveillance imaging, although this slight difference in size measurement may be due to slice selection. 2. Stable appearance mild to moderate intrahepatic biliary dilatation and mild extrahepatic biliary dilatation along with moderate dorsal pancreatic duct dilatation. Some of the biliary prominence may be attributable to prior cholecystectomy. 3. Prior APR with left colostomy. Peristomal hernia contains omental adipose tissue but no bowel. 4. Other imaging findings of potential clinical significance: Aortic Atherosclerosis (ICD10-I70.0). Coronary atherosclerosis. Stable prominence the main pulmonary artery suggesting pulmonary arterial hypertension.  Bilateral renal cysts. Pelvic floor laxity with some bulging of small bowel loops along the pelvic floor. Lumbar spondylosis and degenerative disc disease. Electronically Signed   By: Van Clines M.D.   On: 11/01/2021 15:27   CT CHEST ABDOMEN PELVIS WO CONTRAST  Result Date: 09/02/2021 CLINICAL DATA:  Metastatic rectal cancer, ongoing chemotherapy EXAM: CT CHEST, ABDOMEN AND PELVIS WITHOUT CONTRAST TECHNIQUE: Multidetector CT imaging of  the chest, abdomen and pelvis was performed following the standard protocol without IV contrast. COMPARISON:  05/18/2021 FINDINGS: CT CHEST FINDINGS Cardiovascular: Right chest port catheter. Aortic atherosclerosis. Normal heart size. Left and right coronary artery calcifications. No pericardial effusion. Enlargement of the main pulmonary artery, measuring up to 3.7 cm in caliber. Mediastinum/Nodes: No enlarged mediastinal, hilar, or axillary lymph nodes. Thyroid gland, trachea, and esophagus demonstrate no significant findings. Lungs/Pleura: Multiple small bilateral pulmonary nodules are not significantly changed, for example a 0.5 cm nodule of the medial segment right middle lobe (series 3, image 103) and a 0.3 cm nodule of the posterior right upper lobe (series 3, image 3). No pleural effusion or pneumothorax. Musculoskeletal: No chest wall mass or suspicious bone lesions identified. CT ABDOMEN PELVIS FINDINGS Hepatobiliary: No focal liver abnormality is seen. Status post cholecystectomy. Postoperative biliary dilatation. Pancreas: Unremarkable. No pancreatic ductal dilatation or surrounding inflammatory changes. Spleen: Normal in size without significant abnormality. Adrenals/Urinary Tract: Adrenal glands are unremarkable. Multiple bilateral renal cysts. Kidneys are otherwise normal, without renal calculi, solid lesion, or hydronephrosis. Bladder is unremarkable. Stomach/Bowel: Stomach is within normal limits. Appendix appears normal. No evidence of bowel wall  thickening, distention, or inflammatory changes. Status post abdominoperineal resection and left lower quadrant end colostomy. Small parastomal hernia. Diverticulosis of the remnant sigmoid colon. Vascular/Lymphatic: Aortic atherosclerosis. No enlarged abdominal or pelvic lymph nodes. Reproductive: Status post hysterectomy. Other: No abdominal wall hernia or abnormality. No abdominopelvic ascites. Musculoskeletal: No acute or significant osseous findings. IMPRESSION: 1. Multiple small bilateral pulmonary nodules are not significantly changed, consistent with stable metastatic disease. 2. No noncontrast CT evidence of new metastatic disease in the chest, abdomen or pelvis. 3. Status post abdominoperineal resection and left lower quadrant end colostomy. Small parastomal hernia. 4. Enlargement of the main pulmonary artery, as can be seen in pulmonary hypertension. 5. Coronary artery disease. Aortic Atherosclerosis (ICD10-I70.0). Electronically Signed   By: Delanna Ahmadi M.D.   On: 09/02/2021 10:39      CT CHEST ABDOMEN PELVIS W CONTRAST  Result Date: 11/01/2021 CLINICAL DATA:  Restaging rectal cancer, elevated CEA level. Prior APR. EXAM: CT CHEST, ABDOMEN, AND PELVIS WITH CONTRAST TECHNIQUE: Multidetector CT imaging of the chest, abdomen and pelvis was performed following the standard protocol during bolus administration of intravenous contrast. RADIATION DOSE REDUCTION: This exam was performed according to the departmental dose-optimization program which includes automated exposure control, adjustment of the mA and/or kV according to patient size and/or use of iterative reconstruction technique. CONTRAST:  19mL OMNIPAQUE IOHEXOL 300 MG/ML  SOLN COMPARISON:  Multiple exams, including 09/01/2021 FINDINGS: CT CHEST FINDINGS Cardiovascular: Right Port-A-Cath tip: Right atrium. Coronary, aortic arch, and branch vessel atherosclerotic vascular disease. Stable prominence of the main pulmonary artery.  Mediastinum/Nodes: No pathologic adenopathy identified. Lungs/Pleura: Index right middle lobe nodule measures 6 by 4 by 6 mm on image 97 series 4, previously 6 by 4 by 6 mm by my measurements, stable. A 3 by 3 mm left upper lobe nodule on image 44 series 4 previously measured 2 by 3 mm. Other scattered tiny pulmonary nodules are stable. Musculoskeletal: Unremarkable CT ABDOMEN PELVIS FINDINGS Hepatobiliary: Prior cholecystectomy. Mild to moderate intrahepatic biliary dilatation. Common bile duct 8 mm in diameter. Overall appearance similar to prior. No well-defined focal hepatic metastatic lesion. Pancreas: Pancreatic duct dilatation, measuring 3.5 mm in the pancreatic body and 5 mm in diameter in the pancreatic head near the ampulla. This is similar to prior exams such as 05/18/2021. No focal pancreatic lesion identified. Spleen:  Unremarkable Adrenals/Urinary Tract: Bilateral renal cysts are again observed. The adrenal glands appear normal. Stomach/Bowel: Prior APR with left colostomy. Normal appendix. No dilated bowel. As on the prior exam there is some prominence of fatty tissues along the stoma suggesting peristomal herniation of omental adipose tissue but no herniation of bowel pelvic floor laxity along the APR with bulging of tissues including loops of small bowel on image 113 series 6. Vascular/Lymphatic: Atherosclerosis is present, including aortoiliac atherosclerotic disease. No pathologic adenopathy observed. Reproductive: Uterus absent.  Pelvic floor laxity. Other: No supplemental non-categorized findings. Musculoskeletal: Lumbar spondylosis and degenerative disc disease. IMPRESSION: 1. Most of the small pulmonary nodules are stable compared to the prior exam, including the index 6 by 4 by 6 mm right middle lobe nodule. A 3 by 3 mm left upper lobe nodule previously measured 2 by 3 mm and may merit attention on surveillance imaging, although this slight difference in size measurement may be due to slice  selection. 2. Stable appearance mild to moderate intrahepatic biliary dilatation and mild extrahepatic biliary dilatation along with moderate dorsal pancreatic duct dilatation. Some of the biliary prominence may be attributable to prior cholecystectomy. 3. Prior APR with left colostomy. Peristomal hernia contains omental adipose tissue but no bowel. 4. Other imaging findings of potential clinical significance: Aortic Atherosclerosis (ICD10-I70.0). Coronary atherosclerosis. Stable prominence the main pulmonary artery suggesting pulmonary arterial hypertension. Bilateral renal cysts. Pelvic floor laxity with some bulging of small bowel loops along the pelvic floor. Lumbar spondylosis and degenerative disc disease. Electronically Signed   By: Van Clines M.D.   On: 11/01/2021 15:27   CT CHEST ABDOMEN PELVIS WO CONTRAST  Result Date: 09/02/2021 CLINICAL DATA:  Metastatic rectal cancer, ongoing chemotherapy EXAM: CT CHEST, ABDOMEN AND PELVIS WITHOUT CONTRAST TECHNIQUE: Multidetector CT imaging of the chest, abdomen and pelvis was performed following the standard protocol without IV contrast. COMPARISON:  05/18/2021 FINDINGS: CT CHEST FINDINGS Cardiovascular: Right chest port catheter. Aortic atherosclerosis. Normal heart size. Left and right coronary artery calcifications. No pericardial effusion. Enlargement of the main pulmonary artery, measuring up to 3.7 cm in caliber. Mediastinum/Nodes: No enlarged mediastinal, hilar, or axillary lymph nodes. Thyroid gland, trachea, and esophagus demonstrate no significant findings. Lungs/Pleura: Multiple small bilateral pulmonary nodules are not significantly changed, for example a 0.5 cm nodule of the medial segment right middle lobe (series 3, image 103) and a 0.3 cm nodule of the posterior right upper lobe (series 3, image 3). No pleural effusion or pneumothorax. Musculoskeletal: No chest wall mass or suspicious bone lesions identified. CT ABDOMEN PELVIS FINDINGS  Hepatobiliary: No focal liver abnormality is seen. Status post cholecystectomy. Postoperative biliary dilatation. Pancreas: Unremarkable. No pancreatic ductal dilatation or surrounding inflammatory changes. Spleen: Normal in size without significant abnormality. Adrenals/Urinary Tract: Adrenal glands are unremarkable. Multiple bilateral renal cysts. Kidneys are otherwise normal, without renal calculi, solid lesion, or hydronephrosis. Bladder is unremarkable. Stomach/Bowel: Stomach is within normal limits. Appendix appears normal. No evidence of bowel wall thickening, distention, or inflammatory changes. Status post abdominoperineal resection and left lower quadrant end colostomy. Small parastomal hernia. Diverticulosis of the remnant sigmoid colon. Vascular/Lymphatic: Aortic atherosclerosis. No enlarged abdominal or pelvic lymph nodes. Reproductive: Status post hysterectomy. Other: No abdominal wall hernia or abnormality. No abdominopelvic ascites. Musculoskeletal: No acute or significant osseous findings. IMPRESSION: 1. Multiple small bilateral pulmonary nodules are not significantly changed, consistent with stable metastatic disease. 2. No noncontrast CT evidence of new metastatic disease in the chest, abdomen or pelvis. 3. Status post abdominoperineal resection and  left lower quadrant end colostomy. Small parastomal hernia. 4. Enlargement of the main pulmonary artery, as can be seen in pulmonary hypertension. 5. Coronary artery disease. Aortic Atherosclerosis (ICD10-I70.0). Electronically Signed   By: Delanna Ahmadi M.D.   On: 09/02/2021 10:39     ASSESSMENT & PLAN:  1. Encounter for antineoplastic chemotherapy   2. Metastatic adenocarcinoma ([place])   3. History of pulmonary embolism   4. Chemotherapy induced neutropenia ([facility])   5. Hypokalemia    Cancer Staging  Rectal cancer [facility]) Staging form: Colon and Rectum, AJCC 8th Edition - Clinical stage from 01/23/2019: Stage IIIC (cT4b, cN1a, cM0) - Signed by  Earlie Server, MD on 01/23/2019 - Pathologic stage from 10/06/2019: Stage IIC (ypT4b, pN0, cM0) - Signed by Earlie Server, MD on 10/06/2019 - Pathologic: Stage Unknown (rpTX, pNX, cM1) - Signed by Earlie Server, MD on 01/31/2021  #History of stage IIIC Rectal cancer, s/p TNT, followed by 09/17/19 APR/posterior vaginectomy/TAH/BSO/VY-flap, pT4b pN0 with close vaginal margin 0.2 mm.  Uterus and ovaries negative for malignancy. palliative radiation to vaginal recurrence.  Finished 09/04/2020 01/19/21 recurrence with lung metastasis.-Palliative -FOLFIRI plus bevacizumab.  Irinotecan was dropped in November 2022 due to side effects. Labs are reviewed and discussed with patient. CEA continues to trend up.  CT scan shows stable disease. Discussed with patient and daughter that CEA upward trending usually occurs prior to radiographic progression.  Options of continuing 5-FU/bevacizumab and switch to later lines of treatment on radiographic progression vs adding back Irinotecan at the lower dose along with 5-FU/bevacizumab currently were discussed with patient.  Patient prefers to add lower dose of irinotecan to her regimen and see if that will decrease CEA. Will check Irinotecan toxicity profile as well. Proceed with FOLFIRI/bevacizumab today.  Irinotecan dose reduce to 75 mg/m2   Chmotherapy-induced diarrhea, stable /improved symptoms.  Continue as needed continue Imodium or lomotil as needed..   # chemotherapy induced neutropenia , patient will receive long-acting GCSF prophylaxis.   #Hypokalemia, potassium is stable.  Continue potassium chloride 20 mEq twice daily.  #History of pulmonary embolism, history of bilateral DVT Continue Eliquis 2.5 mg twice daily for anticoagulation prophylaxis.   Follow-up 2 weeks for next cycle of treatment.  Earlie Server, MD, PhD

## 2021-11-09 NOTE — Progress Notes (Signed)
Nutrition Follow-up: ° °Patient with metastatic rectal cancer to lung on palliative chemotherapy.   ° °Met with patient during infusion.  Reports that appetite is better and taste is back.  Patient reports that she is usually eating all that is on her plate now vs leaving some as before.  Drinking ensure and carnation instant breakfast shakes.   ° ° ° °Medications: reviewed ° °Labs: reviewed ° °Anthropometrics:  ° °Weight 162 lb today ° °157 lb on 11/29 °154 lb 12.8 oz on 11/15 °160 lb 4 oz on 10/18 ° ° °NUTRITION DIAGNOSIS: Altered GI function improving ° ° °INTERVENTION:  °Continue eating well balanced diet including good sources of protein °Continue medications for diarrhea °Continue oral nutrition supplements °  ° °MONITORING, EVALUATION, GOAL: weight trends, intake ° ° °NEXT VISIT: as needed ° °Joli B. Allen, RD, LDN °Registered Dietitian °[phone] (mobile) ° ° °

## 2021-11-09 NOTE — Progress Notes (Signed)
Patient developed hives after Irinotecan today. This happens after every Irinotecan treatment. Dr. Tasia Catchings aware therefore, patient gets 25 mg of Benadryl before and after her treatment. Vital signs stable at discharge. Informed patient and her daughter to call the [facility] if she have any concerns or go to ER if symptoms worsen.

## 2021-11-10 LAB — CEA: CEA: 423 ng/mL — ABNORMAL HIGH (ref 0.0–4.7)

## 2021-11-11 ENCOUNTER — Other Ambulatory Visit: Payer: Self-pay

## 2021-11-11 ENCOUNTER — Inpatient Hospital Stay: Payer: Medicare Other

## 2021-11-11 VITALS — BP 129/59 | HR 69 | Temp 98.4°F | Resp 18

## 2021-11-11 DIAGNOSIS — Z5111 Encounter for antineoplastic chemotherapy: Secondary | ICD-10-CM | POA: Diagnosis not present

## 2021-11-11 DIAGNOSIS — C20 Malignant neoplasm of rectum: Secondary | ICD-10-CM

## 2021-11-11 DIAGNOSIS — Z86711 Personal history of pulmonary embolism: Secondary | ICD-10-CM | POA: Diagnosis not present

## 2021-11-11 DIAGNOSIS — Z5112 Encounter for antineoplastic immunotherapy: Secondary | ICD-10-CM | POA: Diagnosis not present

## 2021-11-11 DIAGNOSIS — D701 Agranulocytosis secondary to cancer chemotherapy: Secondary | ICD-10-CM | POA: Diagnosis not present

## 2021-11-11 DIAGNOSIS — Z7901 Long term (current) use of anticoagulants: Secondary | ICD-10-CM | POA: Diagnosis not present

## 2021-11-11 DIAGNOSIS — Z5189 Encounter for other specified aftercare: Secondary | ICD-10-CM | POA: Diagnosis not present

## 2021-11-11 DIAGNOSIS — Z86718 Personal history of other venous thrombosis and embolism: Secondary | ICD-10-CM | POA: Diagnosis not present

## 2021-11-11 DIAGNOSIS — Z87891 Personal history of nicotine dependence: Secondary | ICD-10-CM | POA: Diagnosis not present

## 2021-11-11 DIAGNOSIS — Z79899 Other long term (current) drug therapy: Secondary | ICD-10-CM | POA: Diagnosis not present

## 2021-11-11 DIAGNOSIS — C799 Secondary malignant neoplasm of unspecified site: Secondary | ICD-10-CM

## 2021-11-11 DIAGNOSIS — T451X5A Adverse effect of antineoplastic and immunosuppressive drugs, initial encounter: Secondary | ICD-10-CM | POA: Diagnosis not present

## 2021-11-11 DIAGNOSIS — C774 Secondary and unspecified malignant neoplasm of inguinal and lower limb lymph nodes: Secondary | ICD-10-CM | POA: Diagnosis not present

## 2021-11-11 DIAGNOSIS — E876 Hypokalemia: Secondary | ICD-10-CM | POA: Diagnosis not present

## 2021-11-11 MED ORDER — PEGFILGRASTIM-BMEZ 6 MG/0.6ML ~~LOC~~ SOSY
6.0000 mg | PREFILLED_SYRINGE | Freq: Once | SUBCUTANEOUS | Status: AC
  Administered 2021-11-11: 6 mg via SUBCUTANEOUS
  Filled 2021-11-11: qty 0.6

## 2021-11-11 MED ORDER — HEPARIN SOD (PORK) LOCK FLUSH 100 UNIT/ML IV SOLN
500.0000 [IU] | Freq: Once | INTRAVENOUS | Status: AC | PRN
  Administered 2021-11-11: 500 [IU]
  Filled 2021-11-11: qty 5

## 2021-11-11 MED ORDER — SODIUM CHLORIDE 0.9% FLUSH
10.0000 mL | INTRAVENOUS | Status: DC | PRN
  Administered 2021-11-11: 10 mL
  Filled 2021-11-11: qty 10

## 2021-11-12 DIAGNOSIS — I129 Hypertensive chronic kidney disease with stage 1 through stage 4 chronic kidney disease, or unspecified chronic kidney disease: Secondary | ICD-10-CM | POA: Diagnosis not present

## 2021-11-12 DIAGNOSIS — Z86711 Personal history of pulmonary embolism: Secondary | ICD-10-CM | POA: Diagnosis not present

## 2021-11-12 DIAGNOSIS — D701 Agranulocytosis secondary to cancer chemotherapy: Secondary | ICD-10-CM | POA: Diagnosis not present

## 2021-11-12 DIAGNOSIS — T451X5D Adverse effect of antineoplastic and immunosuppressive drugs, subsequent encounter: Secondary | ICD-10-CM | POA: Diagnosis not present

## 2021-11-12 DIAGNOSIS — E782 Mixed hyperlipidemia: Secondary | ICD-10-CM | POA: Diagnosis not present

## 2021-11-12 DIAGNOSIS — I825Z3 Chronic embolism and thrombosis of unspecified deep veins of distal lower extremity, bilateral: Secondary | ICD-10-CM | POA: Diagnosis not present

## 2021-11-12 DIAGNOSIS — Z7901 Long term (current) use of anticoagulants: Secondary | ICD-10-CM | POA: Diagnosis not present

## 2021-11-12 DIAGNOSIS — M1990 Unspecified osteoarthritis, unspecified site: Secondary | ICD-10-CM | POA: Diagnosis not present

## 2021-11-12 DIAGNOSIS — C7982 Secondary malignant neoplasm of genital organs: Secondary | ICD-10-CM | POA: Diagnosis not present

## 2021-11-12 DIAGNOSIS — E876 Hypokalemia: Secondary | ICD-10-CM | POA: Diagnosis not present

## 2021-11-12 DIAGNOSIS — K219 Gastro-esophageal reflux disease without esophagitis: Secondary | ICD-10-CM | POA: Diagnosis not present

## 2021-11-12 DIAGNOSIS — L89312 Pressure ulcer of right buttock, stage 2: Secondary | ICD-10-CM | POA: Diagnosis not present

## 2021-11-12 DIAGNOSIS — N182 Chronic kidney disease, stage 2 (mild): Secondary | ICD-10-CM | POA: Diagnosis not present

## 2021-11-12 DIAGNOSIS — Z9181 History of falling: Secondary | ICD-10-CM | POA: Diagnosis not present

## 2021-11-12 DIAGNOSIS — C20 Malignant neoplasm of rectum: Secondary | ICD-10-CM | POA: Diagnosis not present

## 2021-11-12 DIAGNOSIS — Z79899 Other long term (current) drug therapy: Secondary | ICD-10-CM | POA: Diagnosis not present

## 2021-11-12 DIAGNOSIS — Z4800 Encounter for change or removal of nonsurgical wound dressing: Secondary | ICD-10-CM | POA: Diagnosis not present

## 2021-11-12 DIAGNOSIS — Z87891 Personal history of nicotine dependence: Secondary | ICD-10-CM | POA: Diagnosis not present

## 2021-11-12 DIAGNOSIS — Z933 Colostomy status: Secondary | ICD-10-CM | POA: Diagnosis not present

## 2021-11-15 ENCOUNTER — Ambulatory Visit: Payer: Medicare Other | Admitting: Podiatry

## 2021-11-16 DIAGNOSIS — Z933 Colostomy status: Secondary | ICD-10-CM | POA: Diagnosis not present

## 2021-11-16 DIAGNOSIS — Z9181 History of falling: Secondary | ICD-10-CM | POA: Diagnosis not present

## 2021-11-16 DIAGNOSIS — E876 Hypokalemia: Secondary | ICD-10-CM | POA: Diagnosis not present

## 2021-11-16 DIAGNOSIS — E782 Mixed hyperlipidemia: Secondary | ICD-10-CM | POA: Diagnosis not present

## 2021-11-16 DIAGNOSIS — Z7901 Long term (current) use of anticoagulants: Secondary | ICD-10-CM | POA: Diagnosis not present

## 2021-11-16 DIAGNOSIS — Z87891 Personal history of nicotine dependence: Secondary | ICD-10-CM | POA: Diagnosis not present

## 2021-11-16 DIAGNOSIS — K219 Gastro-esophageal reflux disease without esophagitis: Secondary | ICD-10-CM | POA: Diagnosis not present

## 2021-11-16 DIAGNOSIS — Z4800 Encounter for change or removal of nonsurgical wound dressing: Secondary | ICD-10-CM | POA: Diagnosis not present

## 2021-11-16 DIAGNOSIS — N182 Chronic kidney disease, stage 2 (mild): Secondary | ICD-10-CM | POA: Diagnosis not present

## 2021-11-16 DIAGNOSIS — D701 Agranulocytosis secondary to cancer chemotherapy: Secondary | ICD-10-CM | POA: Diagnosis not present

## 2021-11-16 DIAGNOSIS — Z86711 Personal history of pulmonary embolism: Secondary | ICD-10-CM | POA: Diagnosis not present

## 2021-11-16 DIAGNOSIS — M1990 Unspecified osteoarthritis, unspecified site: Secondary | ICD-10-CM | POA: Diagnosis not present

## 2021-11-16 DIAGNOSIS — C7982 Secondary malignant neoplasm of genital organs: Secondary | ICD-10-CM | POA: Diagnosis not present

## 2021-11-16 DIAGNOSIS — L89312 Pressure ulcer of right buttock, stage 2: Secondary | ICD-10-CM | POA: Diagnosis not present

## 2021-11-16 DIAGNOSIS — Z79899 Other long term (current) drug therapy: Secondary | ICD-10-CM | POA: Diagnosis not present

## 2021-11-16 DIAGNOSIS — T451X5D Adverse effect of antineoplastic and immunosuppressive drugs, subsequent encounter: Secondary | ICD-10-CM | POA: Diagnosis not present

## 2021-11-16 DIAGNOSIS — I825Z3 Chronic embolism and thrombosis of unspecified deep veins of distal lower extremity, bilateral: Secondary | ICD-10-CM | POA: Diagnosis not present

## 2021-11-16 DIAGNOSIS — C20 Malignant neoplasm of rectum: Secondary | ICD-10-CM | POA: Diagnosis not present

## 2021-11-16 DIAGNOSIS — I129 Hypertensive chronic kidney disease with stage 1 through stage 4 chronic kidney disease, or unspecified chronic kidney disease: Secondary | ICD-10-CM | POA: Diagnosis not present

## 2021-11-17 ENCOUNTER — Other Ambulatory Visit: Payer: Self-pay | Admitting: Oncology

## 2021-11-17 DIAGNOSIS — I825Z3 Chronic embolism and thrombosis of unspecified deep veins of distal lower extremity, bilateral: Secondary | ICD-10-CM

## 2021-11-17 DIAGNOSIS — Z7901 Long term (current) use of anticoagulants: Secondary | ICD-10-CM

## 2021-11-18 DIAGNOSIS — E876 Hypokalemia: Secondary | ICD-10-CM | POA: Diagnosis not present

## 2021-11-18 DIAGNOSIS — D701 Agranulocytosis secondary to cancer chemotherapy: Secondary | ICD-10-CM | POA: Diagnosis not present

## 2021-11-18 DIAGNOSIS — I825Z3 Chronic embolism and thrombosis of unspecified deep veins of distal lower extremity, bilateral: Secondary | ICD-10-CM | POA: Diagnosis not present

## 2021-11-18 DIAGNOSIS — N182 Chronic kidney disease, stage 2 (mild): Secondary | ICD-10-CM | POA: Diagnosis not present

## 2021-11-18 DIAGNOSIS — Z87891 Personal history of nicotine dependence: Secondary | ICD-10-CM | POA: Diagnosis not present

## 2021-11-18 DIAGNOSIS — L89312 Pressure ulcer of right buttock, stage 2: Secondary | ICD-10-CM | POA: Diagnosis not present

## 2021-11-18 DIAGNOSIS — Z933 Colostomy status: Secondary | ICD-10-CM | POA: Diagnosis not present

## 2021-11-18 DIAGNOSIS — I129 Hypertensive chronic kidney disease with stage 1 through stage 4 chronic kidney disease, or unspecified chronic kidney disease: Secondary | ICD-10-CM | POA: Diagnosis not present

## 2021-11-18 DIAGNOSIS — K219 Gastro-esophageal reflux disease without esophagitis: Secondary | ICD-10-CM | POA: Diagnosis not present

## 2021-11-18 DIAGNOSIS — T451X5D Adverse effect of antineoplastic and immunosuppressive drugs, subsequent encounter: Secondary | ICD-10-CM | POA: Diagnosis not present

## 2021-11-18 DIAGNOSIS — M1990 Unspecified osteoarthritis, unspecified site: Secondary | ICD-10-CM | POA: Diagnosis not present

## 2021-11-18 DIAGNOSIS — Z4800 Encounter for change or removal of nonsurgical wound dressing: Secondary | ICD-10-CM | POA: Diagnosis not present

## 2021-11-18 DIAGNOSIS — Z7901 Long term (current) use of anticoagulants: Secondary | ICD-10-CM | POA: Diagnosis not present

## 2021-11-18 DIAGNOSIS — C7982 Secondary malignant neoplasm of genital organs: Secondary | ICD-10-CM | POA: Diagnosis not present

## 2021-11-18 DIAGNOSIS — E782 Mixed hyperlipidemia: Secondary | ICD-10-CM | POA: Diagnosis not present

## 2021-11-18 DIAGNOSIS — Z79899 Other long term (current) drug therapy: Secondary | ICD-10-CM | POA: Diagnosis not present

## 2021-11-18 DIAGNOSIS — Z86711 Personal history of pulmonary embolism: Secondary | ICD-10-CM | POA: Diagnosis not present

## 2021-11-18 DIAGNOSIS — C20 Malignant neoplasm of rectum: Secondary | ICD-10-CM | POA: Diagnosis not present

## 2021-11-18 DIAGNOSIS — Z9181 History of falling: Secondary | ICD-10-CM | POA: Diagnosis not present

## 2021-11-19 ENCOUNTER — Encounter: Payer: Self-pay | Admitting: Oncology

## 2021-11-21 DIAGNOSIS — Z79899 Other long term (current) drug therapy: Secondary | ICD-10-CM | POA: Diagnosis not present

## 2021-11-21 DIAGNOSIS — M1990 Unspecified osteoarthritis, unspecified site: Secondary | ICD-10-CM | POA: Diagnosis not present

## 2021-11-21 DIAGNOSIS — Z933 Colostomy status: Secondary | ICD-10-CM | POA: Diagnosis not present

## 2021-11-21 DIAGNOSIS — D701 Agranulocytosis secondary to cancer chemotherapy: Secondary | ICD-10-CM | POA: Diagnosis not present

## 2021-11-21 DIAGNOSIS — N182 Chronic kidney disease, stage 2 (mild): Secondary | ICD-10-CM | POA: Diagnosis not present

## 2021-11-21 DIAGNOSIS — Z7901 Long term (current) use of anticoagulants: Secondary | ICD-10-CM | POA: Diagnosis not present

## 2021-11-21 DIAGNOSIS — Z9181 History of falling: Secondary | ICD-10-CM | POA: Diagnosis not present

## 2021-11-21 DIAGNOSIS — I129 Hypertensive chronic kidney disease with stage 1 through stage 4 chronic kidney disease, or unspecified chronic kidney disease: Secondary | ICD-10-CM | POA: Diagnosis not present

## 2021-11-21 DIAGNOSIS — I825Z3 Chronic embolism and thrombosis of unspecified deep veins of distal lower extremity, bilateral: Secondary | ICD-10-CM | POA: Diagnosis not present

## 2021-11-21 DIAGNOSIS — E876 Hypokalemia: Secondary | ICD-10-CM | POA: Diagnosis not present

## 2021-11-21 DIAGNOSIS — Z4800 Encounter for change or removal of nonsurgical wound dressing: Secondary | ICD-10-CM | POA: Diagnosis not present

## 2021-11-21 DIAGNOSIS — T451X5D Adverse effect of antineoplastic and immunosuppressive drugs, subsequent encounter: Secondary | ICD-10-CM | POA: Diagnosis not present

## 2021-11-21 DIAGNOSIS — C7982 Secondary malignant neoplasm of genital organs: Secondary | ICD-10-CM | POA: Diagnosis not present

## 2021-11-21 DIAGNOSIS — Z86711 Personal history of pulmonary embolism: Secondary | ICD-10-CM | POA: Diagnosis not present

## 2021-11-21 DIAGNOSIS — L89312 Pressure ulcer of right buttock, stage 2: Secondary | ICD-10-CM | POA: Diagnosis not present

## 2021-11-21 DIAGNOSIS — E782 Mixed hyperlipidemia: Secondary | ICD-10-CM | POA: Diagnosis not present

## 2021-11-21 DIAGNOSIS — Z87891 Personal history of nicotine dependence: Secondary | ICD-10-CM | POA: Diagnosis not present

## 2021-11-21 DIAGNOSIS — C20 Malignant neoplasm of rectum: Secondary | ICD-10-CM | POA: Diagnosis not present

## 2021-11-21 DIAGNOSIS — K219 Gastro-esophageal reflux disease without esophagitis: Secondary | ICD-10-CM | POA: Diagnosis not present

## 2021-11-22 LAB — MISC LABCORP TEST (SEND OUT): Labcorp test code: 511200

## 2021-11-23 ENCOUNTER — Other Ambulatory Visit: Payer: Medicare Other

## 2021-11-23 ENCOUNTER — Ambulatory Visit: Payer: Medicare Other

## 2021-11-23 ENCOUNTER — Ambulatory Visit: Payer: Medicare Other | Admitting: Oncology

## 2021-11-25 ENCOUNTER — Encounter: Payer: Self-pay | Admitting: *Deleted

## 2021-11-25 DIAGNOSIS — Z87891 Personal history of nicotine dependence: Secondary | ICD-10-CM | POA: Diagnosis not present

## 2021-11-25 DIAGNOSIS — D701 Agranulocytosis secondary to cancer chemotherapy: Secondary | ICD-10-CM | POA: Diagnosis not present

## 2021-11-25 DIAGNOSIS — I129 Hypertensive chronic kidney disease with stage 1 through stage 4 chronic kidney disease, or unspecified chronic kidney disease: Secondary | ICD-10-CM | POA: Diagnosis not present

## 2021-11-25 DIAGNOSIS — K219 Gastro-esophageal reflux disease without esophagitis: Secondary | ICD-10-CM | POA: Diagnosis not present

## 2021-11-25 DIAGNOSIS — N182 Chronic kidney disease, stage 2 (mild): Secondary | ICD-10-CM | POA: Diagnosis not present

## 2021-11-25 DIAGNOSIS — M1990 Unspecified osteoarthritis, unspecified site: Secondary | ICD-10-CM | POA: Diagnosis not present

## 2021-11-25 DIAGNOSIS — T451X5D Adverse effect of antineoplastic and immunosuppressive drugs, subsequent encounter: Secondary | ICD-10-CM | POA: Diagnosis not present

## 2021-11-25 DIAGNOSIS — E876 Hypokalemia: Secondary | ICD-10-CM | POA: Diagnosis not present

## 2021-11-25 DIAGNOSIS — Z933 Colostomy status: Secondary | ICD-10-CM | POA: Diagnosis not present

## 2021-11-25 DIAGNOSIS — Z86711 Personal history of pulmonary embolism: Secondary | ICD-10-CM | POA: Diagnosis not present

## 2021-11-25 DIAGNOSIS — C20 Malignant neoplasm of rectum: Secondary | ICD-10-CM | POA: Diagnosis not present

## 2021-11-25 DIAGNOSIS — Z4800 Encounter for change or removal of nonsurgical wound dressing: Secondary | ICD-10-CM | POA: Diagnosis not present

## 2021-11-25 DIAGNOSIS — Z9181 History of falling: Secondary | ICD-10-CM | POA: Diagnosis not present

## 2021-11-25 DIAGNOSIS — E782 Mixed hyperlipidemia: Secondary | ICD-10-CM | POA: Diagnosis not present

## 2021-11-25 DIAGNOSIS — Z79899 Other long term (current) drug therapy: Secondary | ICD-10-CM | POA: Diagnosis not present

## 2021-11-25 DIAGNOSIS — I825Z3 Chronic embolism and thrombosis of unspecified deep veins of distal lower extremity, bilateral: Secondary | ICD-10-CM | POA: Diagnosis not present

## 2021-11-25 DIAGNOSIS — C7982 Secondary malignant neoplasm of genital organs: Secondary | ICD-10-CM | POA: Diagnosis not present

## 2021-11-25 DIAGNOSIS — Z7901 Long term (current) use of anticoagulants: Secondary | ICD-10-CM | POA: Diagnosis not present

## 2021-11-25 DIAGNOSIS — L89312 Pressure ulcer of right buttock, stage 2: Secondary | ICD-10-CM | POA: Diagnosis not present

## 2021-11-28 ENCOUNTER — Other Ambulatory Visit: Payer: Self-pay

## 2021-11-28 ENCOUNTER — Inpatient Hospital Stay: Payer: Medicare Other | Attending: Oncology

## 2021-11-28 ENCOUNTER — Encounter: Payer: Self-pay | Admitting: Oncology

## 2021-11-28 ENCOUNTER — Inpatient Hospital Stay: Payer: Medicare Other

## 2021-11-28 ENCOUNTER — Inpatient Hospital Stay (HOSPITAL_BASED_OUTPATIENT_CLINIC_OR_DEPARTMENT_OTHER): Payer: Medicare Other | Admitting: Oncology

## 2021-11-28 VITALS — BP 123/69 | HR 68 | Resp 18

## 2021-11-28 VITALS — BP 121/61 | HR 75 | Temp 97.7°F | Resp 18 | Wt 161.5 lb

## 2021-11-28 DIAGNOSIS — E7800 Pure hypercholesterolemia, unspecified: Secondary | ICD-10-CM | POA: Diagnosis not present

## 2021-11-28 DIAGNOSIS — Z7901 Long term (current) use of anticoagulants: Secondary | ICD-10-CM | POA: Diagnosis not present

## 2021-11-28 DIAGNOSIS — Z86718 Personal history of other venous thrombosis and embolism: Secondary | ICD-10-CM | POA: Insufficient documentation

## 2021-11-28 DIAGNOSIS — Z87891 Personal history of nicotine dependence: Secondary | ICD-10-CM | POA: Insufficient documentation

## 2021-11-28 DIAGNOSIS — C20 Malignant neoplasm of rectum: Secondary | ICD-10-CM

## 2021-11-28 DIAGNOSIS — I825Z3 Chronic embolism and thrombosis of unspecified deep veins of distal lower extremity, bilateral: Secondary | ICD-10-CM | POA: Diagnosis not present

## 2021-11-28 DIAGNOSIS — Z86711 Personal history of pulmonary embolism: Secondary | ICD-10-CM | POA: Insufficient documentation

## 2021-11-28 DIAGNOSIS — C799 Secondary malignant neoplasm of unspecified site: Secondary | ICD-10-CM | POA: Diagnosis not present

## 2021-11-28 DIAGNOSIS — Z800 Family history of malignant neoplasm of digestive organs: Secondary | ICD-10-CM | POA: Diagnosis not present

## 2021-11-28 DIAGNOSIS — M47816 Spondylosis without myelopathy or radiculopathy, lumbar region: Secondary | ICD-10-CM | POA: Diagnosis not present

## 2021-11-28 DIAGNOSIS — I10 Essential (primary) hypertension: Secondary | ICD-10-CM | POA: Insufficient documentation

## 2021-11-28 DIAGNOSIS — R609 Edema, unspecified: Secondary | ICD-10-CM | POA: Diagnosis not present

## 2021-11-28 DIAGNOSIS — E876 Hypokalemia: Secondary | ICD-10-CM | POA: Diagnosis not present

## 2021-11-28 DIAGNOSIS — Z5189 Encounter for other specified aftercare: Secondary | ICD-10-CM | POA: Diagnosis not present

## 2021-11-28 DIAGNOSIS — Z5111 Encounter for antineoplastic chemotherapy: Secondary | ICD-10-CM | POA: Insufficient documentation

## 2021-11-28 DIAGNOSIS — K5900 Constipation, unspecified: Secondary | ICD-10-CM | POA: Insufficient documentation

## 2021-11-28 DIAGNOSIS — Z8042 Family history of malignant neoplasm of prostate: Secondary | ICD-10-CM | POA: Insufficient documentation

## 2021-11-28 DIAGNOSIS — K219 Gastro-esophageal reflux disease without esophagitis: Secondary | ICD-10-CM | POA: Insufficient documentation

## 2021-11-28 DIAGNOSIS — K521 Toxic gastroenteritis and colitis: Secondary | ICD-10-CM | POA: Diagnosis not present

## 2021-11-28 DIAGNOSIS — R5383 Other fatigue: Secondary | ICD-10-CM | POA: Diagnosis not present

## 2021-11-28 DIAGNOSIS — I2510 Atherosclerotic heart disease of native coronary artery without angina pectoris: Secondary | ICD-10-CM | POA: Insufficient documentation

## 2021-11-28 DIAGNOSIS — R634 Abnormal weight loss: Secondary | ICD-10-CM | POA: Diagnosis not present

## 2021-11-28 DIAGNOSIS — R197 Diarrhea, unspecified: Secondary | ICD-10-CM | POA: Insufficient documentation

## 2021-11-28 DIAGNOSIS — Z933 Colostomy status: Secondary | ICD-10-CM | POA: Insufficient documentation

## 2021-11-28 DIAGNOSIS — D701 Agranulocytosis secondary to cancer chemotherapy: Secondary | ICD-10-CM

## 2021-11-28 DIAGNOSIS — T451X5A Adverse effect of antineoplastic and immunosuppressive drugs, initial encounter: Secondary | ICD-10-CM

## 2021-11-28 DIAGNOSIS — K435 Parastomal hernia without obstruction or  gangrene: Secondary | ICD-10-CM | POA: Insufficient documentation

## 2021-11-28 LAB — COMPREHENSIVE METABOLIC PANEL
ALT: 18 U/L (ref 0–44)
AST: 28 U/L (ref 15–41)
Albumin: 4 g/dL (ref 3.5–5.0)
Alkaline Phosphatase: 58 U/L (ref 38–126)
Anion gap: 7 (ref 5–15)
BUN: 15 mg/dL (ref 8–23)
CO2: 27 mmol/L (ref 22–32)
Calcium: 9.4 mg/dL (ref 8.9–10.3)
Chloride: 102 mmol/L (ref 98–111)
Creatinine, Ser: 0.8 mg/dL (ref 0.44–1.00)
GFR, Estimated: >60 mL/min (ref >60)
Glucose, Bld: 96 mg/dL (ref 70–99)
Potassium: 4.1 mmol/L (ref 3.5–5.1)
Sodium: 136 mmol/L (ref 135–145)
Total Bilirubin: 0.2 mg/dL — ABNORMAL LOW (ref 0.3–1.2)
Total Protein: 6.8 g/dL (ref 6.5–8.1)

## 2021-11-28 LAB — CBC WITH DIFFERENTIAL/PLATELET
Abs Immature Granulocytes: 0.01 10*3/uL (ref 0.00–0.07)
Basophils Absolute: 0 10*3/uL (ref 0.0–0.1)
Basophils Relative: 1 %
Eosinophils Absolute: 0.1 10*3/uL (ref 0.0–0.5)
Eosinophils Relative: 2 %
HCT: 38.4 % (ref 36.0–46.0)
Hemoglobin: 11.9 g/dL — ABNORMAL LOW (ref 12.0–15.0)
Immature Granulocytes: 0 %
Lymphocytes Relative: 26 %
Lymphs Abs: 1.3 10*3/uL (ref 0.7–4.0)
MCH: 30.5 pg (ref 26.0–34.0)
MCHC: 31 g/dL (ref 30.0–36.0)
MCV: 98.5 fL (ref 80.0–100.0)
Monocytes Absolute: 0.6 10*3/uL (ref 0.1–1.0)
Monocytes Relative: 12 %
Neutro Abs: 2.9 10*3/uL (ref 1.7–7.7)
Neutrophils Relative %: 59 %
Platelets: 212 10*3/uL (ref 150–400)
RBC: 3.9 MIL/uL (ref 3.87–5.11)
RDW: 17.5 % — ABNORMAL HIGH (ref 11.5–15.5)
WBC: 5 10*3/uL (ref 4.0–10.5)
nRBC: 0 % (ref 0.0–0.2)

## 2021-11-28 LAB — PROTEIN, URINE, RANDOM: Total Protein, Urine: 13 mg/dL

## 2021-11-28 MED ORDER — SODIUM CHLORIDE 0.9 % IV SOLN
100.0000 mg/m2 | Freq: Once | INTRAVENOUS | Status: AC
  Administered 2021-11-28: 180 mg via INTRAVENOUS
  Filled 2021-11-28: qty 5

## 2021-11-28 MED ORDER — DIPHENHYDRAMINE HCL 50 MG/ML IJ SOLN
25.0000 mg | Freq: Every day | INTRAMUSCULAR | Status: DC | PRN
  Administered 2021-11-28: 25 mg via INTRAVENOUS
  Filled 2021-11-28: qty 1

## 2021-11-28 MED ORDER — SODIUM CHLORIDE 0.9 % IV SOLN
2400.0000 mg/m2 | INTRAVENOUS | Status: DC
  Administered 2021-11-28: 4300 mg via INTRAVENOUS
  Filled 2021-11-28: qty 86

## 2021-11-28 MED ORDER — PALONOSETRON HCL INJECTION 0.25 MG/5ML
0.2500 mg | Freq: Once | INTRAVENOUS | Status: AC
  Administered 2021-11-28: 0.25 mg via INTRAVENOUS
  Filled 2021-11-28: qty 5

## 2021-11-28 MED ORDER — SODIUM CHLORIDE 0.9 % IV SOLN
Freq: Once | INTRAVENOUS | Status: AC
  Filled 2021-11-28: qty 250

## 2021-11-28 MED ORDER — SODIUM CHLORIDE 0.9 % IV SOLN
10.0000 mg | Freq: Once | INTRAVENOUS | Status: AC
  Administered 2021-11-28: 10 mg via INTRAVENOUS
  Filled 2021-11-28: qty 10

## 2021-11-28 MED ORDER — SODIUM CHLORIDE 0.9 % IV SOLN
750.0000 mg | Freq: Once | INTRAVENOUS | Status: AC
  Administered 2021-11-28: 750 mg via INTRAVENOUS
  Filled 2021-11-28: qty 37.5

## 2021-11-28 MED ORDER — ATROPINE SULFATE 1 MG/ML IV SOLN
0.4000 mg | Freq: Once | INTRAVENOUS | Status: AC
  Administered 2021-11-28: 0.4 mg via INTRAVENOUS
  Filled 2021-11-28: qty 1

## 2021-11-28 MED ORDER — SODIUM CHLORIDE 0.9 % IV SOLN
350.0000 mg | Freq: Once | INTRAVENOUS | Status: AC
  Administered 2021-11-28: 350 mg via INTRAVENOUS
  Filled 2021-11-28: qty 14

## 2021-11-28 NOTE — Progress Notes (Signed)
Per Dr. Tasia Catchings only give 25mg  benadryl post irinotecan.  No benadryl to be given prior to infusion.

## 2021-11-28 NOTE — Patient Instructions (Signed)
[facility]  Discharge Instructions: °Thank you for choosing [facility] to provide your oncology and hematology care.  ° °If you have a lab appointment with the Cancer Center, please go directly to the Cancer Center and check in at the registration area. °  °Wear comfortable clothing and clothing appropriate for easy access to any Portacath or PICC line.  ° °We strive to give you quality time with your provider. You may need to reschedule your appointment if you arrive late (15 or more minutes).  Arriving late affects you and other patients whose appointments are after yours.  Also, if you miss three or more appointments without notifying the office, you may be dismissed from the clinic at the provider’s discretion.    °  °For prescription refill requests, have your pharmacy contact our office and allow 72 hours for refills to be completed.   ° °Today you received the following chemotherapy and/or immunotherapy agents     °  °To help prevent nausea and vomiting after your treatment, we encourage you to take your nausea medication as directed. ° °BELOW ARE SYMPTOMS THAT SHOULD BE REPORTED IMMEDIATELY: °*FEVER GREATER THAN 100.4 F (38 °C) OR HIGHER °*CHILLS OR SWEATING °*NAUSEA AND VOMITING THAT IS NOT CONTROLLED WITH YOUR NAUSEA MEDICATION °*UNUSUAL SHORTNESS OF BREATH °*UNUSUAL BRUISING OR BLEEDING °*URINARY PROBLEMS (pain or burning when urinating, or frequent urination) °*BOWEL PROBLEMS (unusual diarrhea, constipation, pain near the anus) °TENDERNESS IN MOUTH AND THROAT WITH OR WITHOUT PRESENCE OF ULCERS (sore throat, sores in mouth, or a toothache) °UNUSUAL RASH, SWELLING OR PAIN  °UNUSUAL VAGINAL DISCHARGE OR ITCHING  ° °Items with * indicate a potential emergency and should be followed up as soon as possible or go to the Emergency Department if any problems should occur. ° °Please show the CHEMOTHERAPY ALERT CARD or IMMUNOTHERAPY ALERT CARD at check-in to the  Emergency Department and triage nurse. ° °Should you have questions after your visit or need to cancel or reschedule your appointment, please contact [facility] AT [facility]  Dept: [phone]  and follow the prompts.  Office hours are 8:00 a.m. to 4:30 p.m. Monday - Friday. Please note that voicemails left after 4:00 p.m. may not be returned until the following business day.  We are closed weekends and major holidays. You have access to a nurse at all times for urgent questions. Please call the main number to the clinic Dept: [phone] and follow the prompts. ° ° °For any non-urgent questions, you may also contact your provider using MyChart. We now offer e-Visits for anyone 18 and older to request care online for non-urgent symptoms. For details visit [email or web address]. °  °Also download the MyChart app! Go to the app store, search "MyChart", open the app, select [facility], and log in with your MyChart username and password. ° °Due to Covid, a mask is required upon entering the hospital/clinic. If you do not have a mask, one will be given to you upon arrival. For doctor visits, patients may have 1 support person aged 18 or older with them. For treatment visits, patients cannot have anyone with them due to current Covid guidelines and our immunocompromised population.  ° °

## 2021-11-28 NOTE — Progress Notes (Signed)
Hematology/Oncology progress note [phone] [phone]   Patient Care Team: Olin Hauser, DO as PCP - General (Family Medicine) Clent Jacks, RN as Registered Nurse Earlie Server, MD as Consulting Physician (Hematology and Oncology)  CHIEF COMPLAINTS/REASON FOR VISIT:  Follow up for rectal cancer  HISTORY OF PRESENTING ILLNESS:  Patient initially presented with complaints of postmenopausal bleeding on 08/16/2018.  History of was menopausal vaginal bleeding in 2016 which resulted in cervical polypectomy.  Pathology 02/04/2015 showed cervical polyp, consistent with benign endometrial polyp.  [name] after polypectomy due to anxiety associated with pelvic exams.  pelvic exam on 08/16/2018 reviewed cervical abnormality and from enlarged uterus. Seen by Dr. Marcelline Mates on 10/29/2018.  Endometrial biopsy and a Pap smear was performed. 10/29/2018 Pap smear showed adenocarcinoma, favor endometrial origin. 10/29/2018 endometrial biopsy showed endometrioid carcinoma, FIGO grade 1.  10/29/2018- TA & TV Ultrasound revealed: Anteverted uterus measuring 8.7 x 5.6 x 6.4 cm without evidence of focal masses.  The endometrium measuring 24.1 mm (thickened) and heterogeneous.  Right and left ovaries not visualized.  No adnexal masses identified.  No free fluid in cul-de-sac.  Patient was seen by Dr. Theora Gianotti in clinic on 11/13/2018.  Cervical exam reveals 2 cm exophytic irregular mass consistent with malignancy.   11/19/2018 CT chest abdomen pelvis with contrast showed thickened endometrium with some irregularity compatible with the provided diagnosis of endometrial malignancy.  There is a mildly prominent left inguinal node 1.4 cm.  Patient was seen by Dr. Fransisca Connors on 11/20/2018 and left groin lymph node biopsy was recommended.  11/26/2018 patient underwent left inguinal lymph node biopsy. Pathology showed metastatic adenocarcinoma consistent with colorectal origin.  CDX  2+.  Case was discussed on tumor board.  Recommend colonoscopy for further evaluation.  Patient reports significant weight loss 30 pounds over the last year.  Chronic vaginal spotting. Change of bowel habits the past few months.  More constipated.  Family history positive for brother who has colon cancer prostate cancer.  patient has underwent colonoscopy on 12/03/2018 which reviewed a nonobstructing large mass in the rectum.  Also chronic fistula.  Mass was not circumferential.  This was biopsied with a cold forceps for histology.  Pathology came back hyperplastic polyp negative for dysplasia and malignancy. [name], patient underwent flex sigmoidoscopy on 12/06/2018 with rebiopsy of the rectal mass. This time biopsy results came back positive for invasive colorectal adenocarcinoma, moderately differentiated. Immunotherapy for nearly mismatch repair protein (MMR ) was performed.  There is no loss of MMR expression.  low probability of MSI high.   # Seen by [name] for evaluation of resectability for rectal cancer. In addition, she also had a second opinion with [facility] where her endometrial biopsy pathology was changed to  adenocarcinoma, consistent with colorectal primary.   [name]. She has home health that has been assisting with ostomy care  Patient was also evaluated by [facility] oncology.  Recommendation is to proceed with TNT with concurrent chemoradiation followed by neoadjuvant chemotherapy followed by surgical resection. Patient prefers to have treatment done locally with [facility].   # Oncology Treatment:  02/03/2019- 03/19/2019  concurrent Xeloda and radiation.  Xeloda dose 816m /m2 BID - rounded to 1652mBID- on days of radiation. 04/09/2019, started on FOLFOX with bolus early.  Omitted.  07/16/2019 finished 8 cycles of FOLFOX. 09/17/19 APR/posterior vaginectomy/TAH/BSO/VY-flap pT4b pN0 with close vaginal margin 0.2  mm.  Uterus and ovaries negative for malignancy. Patient  reports bilateral lower extremity numbness and tingling, intermittent, left worse than right. She has lost a lot of weight since her APR surgery.   #Family history with half brother having's history of colon cancer prostate cancer.  Personal history of colorectal cancer.  Patient has not decided if she wants genetic testing.    # history of PE( 01/13/2020)  in the bilateral lower extremity DVT (01/13/2020).   She finishes 6 months of anticoagulation with Eliquis 5 mg twice daily. Now switched to Eliquis 2.5 mg twice daily..  # She has now developed recurrent disease. #06/30/20  vaginal introitus mass biopsied. Pathology is consistent with metastatic colorectal adenocarcinoma I have discussed with [name]  Dr. Hester Mates and the mass is not resectable. Patient has also had colonoscopy by Dr. Vicente Males yesterday. Normal examination. # 07/16/2020 cycle 1 FOLFIRI  # 07/20/2020 PET scan was done for further evaluation, images are consistent with local recurrence, no distant metastasis. #Discussed with radiation oncology Dr. Baruch Gouty will recommends concurrent chemotherapy and radiation. 08/02/2020-08/16/2020, patient starts radiation.  Xeloda was held due to neutropenia 08/17/2020,-09/06/2020 Xeloda 1500 mg twice daily concurrently with radiation  01/31/21 started on FOLFIRI + Bev 05/18/2021 CT chest abdomen pelvis showed Previously noted enlargement of bilateral inguinal lymph nodes is resolved, consistent with treatment response of nodal metastatic disease. Interval decrease in size of multiple small bilateral pulmonary nodules, consistent with treatment response of pulmonary metastatic disease. No evidence of new metastatic disease. 05/24/2021 - 08/30/2021, continued on FOLFIRI plus bevacizumab.  Irinotecan dose was reduced, eventually 100mg /m2  09/02/2021, CT chest abdomen pelvis without contrast Showed small bilateral pulmonary nodules, unchanged.   Stable metastatic disease.  No noncontrast evidence of new metastatic disease in the chest abdomen pelvis.  Small parastomal hernia.  Enlargement of main pulmonary artery.  Coronary artery disease.  09/13/2021, maintenance 5-FU/bevacizumab   INTERVAL HISTORY Jacqueline Yoder is a 76 y.o. female who has above history reviewed by me presents for follow-up of rectal cancer. Patient is status post FOLFIRI/bevacizumab 2 weeks ago.  She reports 2-3 bouts of loose bowel movement and she took Imodium with good symptom relief. No nausea vomiting.  Appetite is fair and weight is stable.   Review of Systems  Constitutional:  Positive for fatigue. Negative for appetite change, chills, fever and unexpected weight change.  HENT:   Negative for hearing loss and voice change.   Eyes:  Negative for eye problems.  Respiratory:  Negative for chest tightness and cough.   Cardiovascular:  Negative for chest pain.  Gastrointestinal:  Positive for diarrhea. Negative for abdominal distention, abdominal pain, blood in stool, constipation and nausea.  Endocrine: Negative for hot flashes.  Genitourinary:  Negative for difficulty urinating and frequency.   Musculoskeletal:  Negative for arthralgias.  Skin:  Negative for itching and rash.  Neurological:  Negative for extremity weakness and numbness.  Hematological:  Negative for adenopathy.  Psychiatric/Behavioral:  Negative for confusion.    MEDICAL HISTORY:  Past Medical History:  Diagnosis Date   Allergy    Arthritis    Blood clot in vein    Family history of colon cancer    GERD (gastroesophageal reflux disease)    Hypercholesteremia    Hypertension    Hypertension    Lower extremity edema    Rectal cancer ([place]) 12/2018   Urinary incontinence     SURGICAL HISTORY: Past Surgical History:  Procedure Laterality Date   ABDOMINAL HYSTERECTOMY     CHOLECYSTECTOMY  1971   COLONOSCOPY WITH PROPOFOL  N/A 12/03/2018   Procedure: COLONOSCOPY WITH PROPOFOL;   Surgeon: Lucilla Lame, MD;  Location: [facility] ENDOSCOPY;  Service: Endoscopy;  Laterality: N/A;   COLONOSCOPY WITH PROPOFOL N/A 07/15/2020   Procedure: COLONOSCOPY WITH PROPOFOL;  Surgeon: [name], MD;  Location: [facility] ENDOSCOPY;  Service: Gastroenterology;  Laterality: N/A;   FLEXIBLE SIGMOIDOSCOPY N/A 12/06/2018   Procedure: FLEXIBLE SIGMOIDOSCOPY;  Surgeon: [name], MD;  Location: [facility] ENDOSCOPY;  Service: Endoscopy;  Laterality: N/A;   LAPAROSCOPIC COLOSTOMY  01/06/2019   PORTACATH PLACEMENT N/A 04/03/2019   Procedure: INSERTION PORT-A-CATH;  Surgeon: Jules Husbands, MD;  Location: [facility] ORS;  Service: General;  Laterality: N/A;    SOCIAL HISTORY: Social History   Socioeconomic History   Marital status: Widowed    Spouse name: Not on file   Number of children: Not on file   Years of education: Not on file   Highest education level: Not on file  Occupational History   Not on file  Tobacco Use   Smoking status: Former    Types: Cigarettes    Quit date: 12/02/1977    Years since quitting: 44.0   Smokeless tobacco: Former  Scientific laboratory technician Use: Never used  Substance and Sexual Activity   Alcohol use: Never   Drug use: Never   Sexual activity: Not Currently    Birth control/protection: None  Other Topics Concern   Not on file  Social History Narrative   Lives with daughter   Social Determinants of Health   Financial Resource Strain: Low Risk    Difficulty of Paying Living Expenses: Not hard at all  Food Insecurity: No Food Insecurity   Worried About Charity fundraiser in the Last Year: Never true   [place] in the Last Year: Never true  Transportation Needs: No Transportation Needs   Lack of Transportation (Medical): No   Lack of Transportation (Non-Medical): No  Physical Activity: Inactive   Days of Exercise per Week: 0 days   Minutes of Exercise per Session: 0 min  Stress: No Stress Concern Present   Feeling of Stress : Not at all  Social Connections:  Not on file  Intimate Partner Violence: Not on file    FAMILY HISTORY: Family History  Problem Relation Age of Onset   Colon cancer Brother 56       exposure to chemicals [place]   Hypertension Mother    Stroke Mother    Kidney failure Father    Breast cancer Neg Hx    Ovarian cancer Neg Hx     ALLERGIES:  is allergic to sulfamethoxazole-trimethoprim.  MEDICATIONS:  Current Outpatient Medications  Medication Sig Dispense Refill   Cholecalciferol (VITAMIN D3) 2000 units capsule Take 2,000 Units by mouth daily.     diclofenac sodium (VOLTAREN) 1 % GEL Apply 2 g topically 4 (four) times daily as needed (joint pain).  11   ELIQUIS 2.5 MG TABS tablet Take 1 tablet by mouth twice daily 60 tablet 2   fluticasone (FLONASE) 50 MCG/ACT nasal spray Use 1 spray(s) in each nostril once daily 16 g 1   gabapentin (NEURONTIN) 100 MG capsule Take 1 capsule (100 mg total) by mouth at bedtime. 30 capsule 0   ipratropium (ATROVENT) 0.03 % nasal spray Place 1 spray into both nostrils 2 (two) times daily. 30 mL 2   lidocaine-prilocaine (EMLA) cream Apply 1 application topically as needed. 30 g 6   loratadine (CLARITIN) 10 MG tablet Take 10 mg by mouth  daily.     Multiple Vitamins-Minerals (ONE-A-DAY WOMENS 50 PLUS PO) Take 1 tablet by mouth daily.      ondansetron (ZOFRAN) 8 MG tablet Take 1 tablet (8 mg total) by mouth 2 (two) times daily as needed for refractory nausea / vomiting. Start on day 3 after chemotherapy. 30 tablet 1   potassium chloride SA (KLOR-CON M) 20 MEQ tablet Take 2 tablets (40 mEq total) by mouth 2 (two) times daily. 120 tablet 1   prochlorperazine (COMPAZINE) 10 MG tablet Take 1 tablet (10 mg total) by mouth every 6 (six) hours as needed (NAUSEA). 30 tablet 1   simvastatin (ZOCOR) 40 MG tablet Take 1 tablet (40 mg total) by mouth at bedtime. 90 tablet 3   triamterene-hydrochlorothiazide (DYAZIDE) 37.5-25 MG capsule Take 1 each (1 capsule total) by mouth daily. 90 capsule 3   zinc  gluconate 50 MG tablet Take 50 mg by mouth daily.     diphenoxylate-atropine (LOMOTIL) 2.5-0.025 MG tablet Take 1 tablet by mouth 4 (four) times daily as needed for diarrhea or loose stools. (Patient not taking: Reported on 09/27/2021) 30 tablet 1   loperamide (IMODIUM) 2 MG capsule Take 1 capsule (2 mg total) by mouth See admin instructions. With onset of loose stool, take $RemoveBef'4mg'csbustSwhJ$  followed by $RemoveBef'2mg'KMJnIGVsbZ$  every 2 hours,  Maximum: 16 mg/day (Patient not taking: Reported on 09/27/2021) 120 capsule 1   No current facility-administered medications for this visit.   Facility-Administered Medications Ordered in Other Visits  Medication Dose Route Frequency Provider Last Rate Last Admin   diphenhydrAMINE (BENADRYL) injection 25 mg  25 mg Intravenous Daily PRN Earlie Server, MD   25 mg at 11/28/21 1334   fluorouracil (ADRUCIL) 4,300 mg in sodium chloride 0.9 % 64 mL chemo infusion  2,400 mg/m2 (Order-Specific) Intravenous 1 day or 1 dose Earlie Server, MD   4,300 mg at 11/28/21 1346     PHYSICAL EXAMINATION: ECOG PERFORMANCE STATUS: 1 - Symptomatic but completely ambulatory  Physical Exam Constitutional:      General: She is not in acute distress. HENT:     Head: Normocephalic and atraumatic.  Eyes:     General: No scleral icterus. Cardiovascular:     Rate and Rhythm: Normal rate and regular rhythm.     Heart sounds: Normal heart sounds.  Pulmonary:     Effort: Pulmonary effort is normal. No respiratory distress.     Breath sounds: No wheezing.  Abdominal:     General: Bowel sounds are normal. There is no distension.     Palpations: Abdomen is soft.     Comments: + Colostomy bag   Musculoskeletal:        General: No deformity. Normal range of motion.     Cervical back: Normal range of motion and neck supple.  Skin:    General: Skin is warm and dry.     Findings: No erythema or rash.  Neurological:     Mental Status: She is alert and oriented to person, place, and time. Mental status is at baseline.      Cranial Nerves: No cranial nerve deficit.     Coordination: Coordination normal.      LABORATORY DATA:  I have reviewed the data as listed Lab Results  Component Value Date   WBC 5.0 11/28/2021   HGB 11.9 (L) 11/28/2021   HCT 38.4 11/28/2021   MCV 98.5 11/28/2021   PLT 212 11/28/2021   Recent Labs    10/24/21 0857 11/09/21 0800 11/28/21 0843  NA  139 139 136  K 4.0 4.1 4.1  CL 103 103 102  CO2 $Re'29 30 27  'WDE$ GLUCOSE 75 109* 96  BUN $Re'11 9 15  'kZZ$ CREATININE 0.96 0.81 0.80  CALCIUM 9.6 9.6 9.4  GFRNONAA >60 >60 >60  PROT 6.8 6.6 6.8  ALBUMIN 4.0 4.1 4.0  AST $Re'30 26 28  'Ksh$ ALT $R'19 16 18  'BR$ ALKPHOS 49 62 58  BILITOT 0.8 0.3 0.2*    Iron/TIBC/Ferritin/ %Sat    Component Value Date/Time   IRON 40 06/23/2020 1124   TIBC 246 (L) 06/23/2020 1124   FERRITIN 338 (H) 06/23/2020 1124   IRONPCTSAT 16 06/23/2020 1124     RADIOGRAPHIC STUDIES: I have personally reviewed the radiological images as listed and agreed with the findings in the report. CT CHEST ABDOMEN PELVIS W CONTRAST  Result Date: 11/01/2021 CLINICAL DATA:  Restaging rectal cancer, elevated CEA level. Prior APR. EXAM: CT CHEST, ABDOMEN, AND PELVIS WITH CONTRAST TECHNIQUE: Multidetector CT imaging of the chest, abdomen and pelvis was performed following the standard protocol during bolus administration of intravenous contrast. RADIATION DOSE REDUCTION: This exam was performed according to the departmental dose-optimization program which includes automated exposure control, adjustment of the mA and/or kV according to patient size and/or use of iterative reconstruction technique. CONTRAST:  154mL OMNIPAQUE IOHEXOL 300 MG/ML  SOLN COMPARISON:  Multiple exams, including 09/01/2021 FINDINGS: CT CHEST FINDINGS Cardiovascular: Right Port-A-Cath tip: Right atrium. Coronary, aortic arch, and branch vessel atherosclerotic vascular disease. Stable prominence of the main pulmonary artery. Mediastinum/Nodes: No pathologic adenopathy identified.  Lungs/Pleura: Index right middle lobe nodule measures 6 by 4 by 6 mm on image 97 series 4, previously 6 by 4 by 6 mm by my measurements, stable. A 3 by 3 mm left upper lobe nodule on image 44 series 4 previously measured 2 by 3 mm. Other scattered tiny pulmonary nodules are stable. Musculoskeletal: Unremarkable CT ABDOMEN PELVIS FINDINGS Hepatobiliary: Prior cholecystectomy. Mild to moderate intrahepatic biliary dilatation. Common bile duct 8 mm in diameter. Overall appearance similar to prior. No well-defined focal hepatic metastatic lesion. Pancreas: Pancreatic duct dilatation, measuring 3.5 mm in the pancreatic body and 5 mm in diameter in the pancreatic head near the ampulla. This is similar to prior exams such as 05/18/2021. No focal pancreatic lesion identified. Spleen: Unremarkable Adrenals/Urinary Tract: Bilateral renal cysts are again observed. The adrenal glands appear normal. Stomach/Bowel: Prior APR with left colostomy. Normal appendix. No dilated bowel. As on the prior exam there is some prominence of fatty tissues along the stoma suggesting peristomal herniation of omental adipose tissue but no herniation of bowel pelvic floor laxity along the APR with bulging of tissues including loops of small bowel on image 113 series 6. Vascular/Lymphatic: Atherosclerosis is present, including aortoiliac atherosclerotic disease. No pathologic adenopathy observed. Reproductive: Uterus absent.  Pelvic floor laxity. Other: No supplemental non-categorized findings. Musculoskeletal: Lumbar spondylosis and degenerative disc disease. IMPRESSION: 1. Most of the small pulmonary nodules are stable compared to the prior exam, including the index 6 by 4 by 6 mm right middle lobe nodule. A 3 by 3 mm left upper lobe nodule previously measured 2 by 3 mm and may merit attention on surveillance imaging, although this slight difference in size measurement may be due to slice selection. 2. Stable appearance mild to moderate  intrahepatic biliary dilatation and mild extrahepatic biliary dilatation along with moderate dorsal pancreatic duct dilatation. Some of the biliary prominence may be attributable to prior cholecystectomy. 3. Prior APR with left colostomy. Peristomal hernia contains  omental adipose tissue but no bowel. 4. Other imaging findings of potential clinical significance: Aortic Atherosclerosis (ICD10-I70.0). Coronary atherosclerosis. Stable prominence the main pulmonary artery suggesting pulmonary arterial hypertension. Bilateral renal cysts. Pelvic floor laxity with some bulging of small bowel loops along the pelvic floor. Lumbar spondylosis and degenerative disc disease. Electronically Signed   By: Van Clines M.D.   On: 11/01/2021 15:27   CT CHEST ABDOMEN PELVIS WO CONTRAST  Result Date: 09/02/2021 CLINICAL DATA:  Metastatic rectal cancer, ongoing chemotherapy EXAM: CT CHEST, ABDOMEN AND PELVIS WITHOUT CONTRAST TECHNIQUE: Multidetector CT imaging of the chest, abdomen and pelvis was performed following the standard protocol without IV contrast. COMPARISON:  05/18/2021 FINDINGS: CT CHEST FINDINGS Cardiovascular: Right chest port catheter. Aortic atherosclerosis. Normal heart size. Left and right coronary artery calcifications. No pericardial effusion. Enlargement of the main pulmonary artery, measuring up to 3.7 cm in caliber. Mediastinum/Nodes: No enlarged mediastinal, hilar, or axillary lymph nodes. Thyroid gland, trachea, and esophagus demonstrate no significant findings. Lungs/Pleura: Multiple small bilateral pulmonary nodules are not significantly changed, for example a 0.5 cm nodule of the medial segment right middle lobe (series 3, image 103) and a 0.3 cm nodule of the posterior right upper lobe (series 3, image 3). No pleural effusion or pneumothorax. Musculoskeletal: No chest wall mass or suspicious bone lesions identified. CT ABDOMEN PELVIS FINDINGS Hepatobiliary: No focal liver abnormality is seen.  Status post cholecystectomy. Postoperative biliary dilatation. Pancreas: Unremarkable. No pancreatic ductal dilatation or surrounding inflammatory changes. Spleen: Normal in size without significant abnormality. Adrenals/Urinary Tract: Adrenal glands are unremarkable. Multiple bilateral renal cysts. Kidneys are otherwise normal, without renal calculi, solid lesion, or hydronephrosis. Bladder is unremarkable. Stomach/Bowel: Stomach is within normal limits. Appendix appears normal. No evidence of bowel wall thickening, distention, or inflammatory changes. Status post abdominoperineal resection and left lower quadrant end colostomy. Small parastomal hernia. Diverticulosis of the remnant sigmoid colon. Vascular/Lymphatic: Aortic atherosclerosis. No enlarged abdominal or pelvic lymph nodes. Reproductive: Status post hysterectomy. Other: No abdominal wall hernia or abnormality. No abdominopelvic ascites. Musculoskeletal: No acute or significant osseous findings. IMPRESSION: 1. Multiple small bilateral pulmonary nodules are not significantly changed, consistent with stable metastatic disease. 2. No noncontrast CT evidence of new metastatic disease in the chest, abdomen or pelvis. 3. Status post abdominoperineal resection and left lower quadrant end colostomy. Small parastomal hernia. 4. Enlargement of the main pulmonary artery, as can be seen in pulmonary hypertension. 5. Coronary artery disease. Aortic Atherosclerosis (ICD10-I70.0). Electronically Signed   By: Delanna Ahmadi M.D.   On: 09/02/2021 10:39      CT CHEST ABDOMEN PELVIS W CONTRAST  Result Date: 11/01/2021 CLINICAL DATA:  Restaging rectal cancer, elevated CEA level. Prior APR. EXAM: CT CHEST, ABDOMEN, AND PELVIS WITH CONTRAST TECHNIQUE: Multidetector CT imaging of the chest, abdomen and pelvis was performed following the standard protocol during bolus administration of intravenous contrast. RADIATION DOSE REDUCTION: This exam was performed according to the  departmental dose-optimization program which includes automated exposure control, adjustment of the mA and/or kV according to patient size and/or use of iterative reconstruction technique. CONTRAST:  178mL OMNIPAQUE IOHEXOL 300 MG/ML  SOLN COMPARISON:  Multiple exams, including 09/01/2021 FINDINGS: CT CHEST FINDINGS Cardiovascular: Right Port-A-Cath tip: Right atrium. Coronary, aortic arch, and branch vessel atherosclerotic vascular disease. Stable prominence of the main pulmonary artery. Mediastinum/Nodes: No pathologic adenopathy identified. Lungs/Pleura: Index right middle lobe nodule measures 6 by 4 by 6 mm on image 97 series 4, previously 6 by 4 by 6 mm by my measurements,  stable. A 3 by 3 mm left upper lobe nodule on image 44 series 4 previously measured 2 by 3 mm. Other scattered tiny pulmonary nodules are stable. Musculoskeletal: Unremarkable CT ABDOMEN PELVIS FINDINGS Hepatobiliary: Prior cholecystectomy. Mild to moderate intrahepatic biliary dilatation. Common bile duct 8 mm in diameter. Overall appearance similar to prior. No well-defined focal hepatic metastatic lesion. Pancreas: Pancreatic duct dilatation, measuring 3.5 mm in the pancreatic body and 5 mm in diameter in the pancreatic head near the ampulla. This is similar to prior exams such as 05/18/2021. No focal pancreatic lesion identified. Spleen: Unremarkable Adrenals/Urinary Tract: Bilateral renal cysts are again observed. The adrenal glands appear normal. Stomach/Bowel: Prior APR with left colostomy. Normal appendix. No dilated bowel. As on the prior exam there is some prominence of fatty tissues along the stoma suggesting peristomal herniation of omental adipose tissue but no herniation of bowel pelvic floor laxity along the APR with bulging of tissues including loops of small bowel on image 113 series 6. Vascular/Lymphatic: Atherosclerosis is present, including aortoiliac atherosclerotic disease. No pathologic adenopathy observed.  Reproductive: Uterus absent.  Pelvic floor laxity. Other: No supplemental non-categorized findings. Musculoskeletal: Lumbar spondylosis and degenerative disc disease. IMPRESSION: 1. Most of the small pulmonary nodules are stable compared to the prior exam, including the index 6 by 4 by 6 mm right middle lobe nodule. A 3 by 3 mm left upper lobe nodule previously measured 2 by 3 mm and may merit attention on surveillance imaging, although this slight difference in size measurement may be due to slice selection. 2. Stable appearance mild to moderate intrahepatic biliary dilatation and mild extrahepatic biliary dilatation along with moderate dorsal pancreatic duct dilatation. Some of the biliary prominence may be attributable to prior cholecystectomy. 3. Prior APR with left colostomy. Peristomal hernia contains omental adipose tissue but no bowel. 4. Other imaging findings of potential clinical significance: Aortic Atherosclerosis (ICD10-I70.0). Coronary atherosclerosis. Stable prominence the main pulmonary artery suggesting pulmonary arterial hypertension. Bilateral renal cysts. Pelvic floor laxity with some bulging of small bowel loops along the pelvic floor. Lumbar spondylosis and degenerative disc disease. Electronically Signed   By: Van Clines M.D.   On: 11/01/2021 15:27   CT CHEST ABDOMEN PELVIS WO CONTRAST  Result Date: 09/02/2021 CLINICAL DATA:  Metastatic rectal cancer, ongoing chemotherapy EXAM: CT CHEST, ABDOMEN AND PELVIS WITHOUT CONTRAST TECHNIQUE: Multidetector CT imaging of the chest, abdomen and pelvis was performed following the standard protocol without IV contrast. COMPARISON:  05/18/2021 FINDINGS: CT CHEST FINDINGS Cardiovascular: Right chest port catheter. Aortic atherosclerosis. Normal heart size. Left and right coronary artery calcifications. No pericardial effusion. Enlargement of the main pulmonary artery, measuring up to 3.7 cm in caliber. Mediastinum/Nodes: No enlarged  mediastinal, hilar, or axillary lymph nodes. Thyroid gland, trachea, and esophagus demonstrate no significant findings. Lungs/Pleura: Multiple small bilateral pulmonary nodules are not significantly changed, for example a 0.5 cm nodule of the medial segment right middle lobe (series 3, image 103) and a 0.3 cm nodule of the posterior right upper lobe (series 3, image 3). No pleural effusion or pneumothorax. Musculoskeletal: No chest wall mass or suspicious bone lesions identified. CT ABDOMEN PELVIS FINDINGS Hepatobiliary: No focal liver abnormality is seen. Status post cholecystectomy. Postoperative biliary dilatation. Pancreas: Unremarkable. No pancreatic ductal dilatation or surrounding inflammatory changes. Spleen: Normal in size without significant abnormality. Adrenals/Urinary Tract: Adrenal glands are unremarkable. Multiple bilateral renal cysts. Kidneys are otherwise normal, without renal calculi, solid lesion, or hydronephrosis. Bladder is unremarkable. Stomach/Bowel: Stomach is within normal limits. Appendix  appears normal. No evidence of bowel wall thickening, distention, or inflammatory changes. Status post abdominoperineal resection and left lower quadrant end colostomy. Small parastomal hernia. Diverticulosis of the remnant sigmoid colon. Vascular/Lymphatic: Aortic atherosclerosis. No enlarged abdominal or pelvic lymph nodes. Reproductive: Status post hysterectomy. Other: No abdominal wall hernia or abnormality. No abdominopelvic ascites. Musculoskeletal: No acute or significant osseous findings. IMPRESSION: 1. Multiple small bilateral pulmonary nodules are not significantly changed, consistent with stable metastatic disease. 2. No noncontrast CT evidence of new metastatic disease in the chest, abdomen or pelvis. 3. Status post abdominoperineal resection and left lower quadrant end colostomy. Small parastomal hernia. 4. Enlargement of the main pulmonary artery, as can be seen in pulmonary hypertension.  5. Coronary artery disease. Aortic Atherosclerosis (ICD10-I70.0). Electronically Signed   By: Delanna Ahmadi M.D.   On: 09/02/2021 10:39     ASSESSMENT & PLAN:  1. Encounter for antineoplastic chemotherapy   2. Rectal cancer ([place])   3. History of pulmonary embolism   4. Chemotherapy induced neutropenia ([facility])   5. Hypokalemia   6. Chemotherapy induced diarrhea   7. Chronic deep vein thrombosis (DVT) of distal vein of both lower extremities ([facility])    Cancer Staging  Rectal cancer [facility]) Staging form: Colon and Rectum, AJCC 8th Edition - Clinical stage from 01/23/2019: Stage IIIC (cT4b, cN1a, cM0) - Signed by Earlie Server, MD on 01/23/2019 - Pathologic stage from 10/06/2019: Stage IIC (ypT4b, pN0, cM0) - Signed by Earlie Server, MD on 10/06/2019 - Pathologic: Stage Unknown (rpTX, pNX, cM1) - Signed by Earlie Server, MD on 01/31/2021  #History of stage IIIC Rectal cancer, s/p TNT, followed by 09/17/19 APR/posterior vaginectomy/TAH/BSO/VY-flap, pT4b pN0 with close vaginal margin 0.2 mm.  Uterus and ovaries negative for malignancy. palliative radiation to vaginal recurrence.  Finished 09/04/2020 01/19/21 recurrence with lung metastasis.-Palliative -FOLFIRI plus bevacizumab.  Irinotecan was dropped in November 2022 due to side effects. CEA has increased to 400s. Labs reviewed and discussed patient. Proceed with FOLFIRI-Irinotecan dose increased to 100 mg/m2/bevacizumab.  This is to Irinotecan dose she received back in November.  Labs are reviewed and discussed with patient. CEA continues to trend up.  CT scan shows stable disease. Discussed with patient and daughter that CEA upward trending usually occurs prior to radiographic progression.  Options of continuing 5-FU/bevacizumab and switch to later lines of treatment on radiographic progression vs adding back Irinotecan at the lower dose along with 5-FU/bevacizumab currently were discussed with patient.  Patient prefers to add lower dose of irinotecan to her regimen and  see if that will decrease CEA. Will check Irinotecan toxicity profile as well. Proceed with FOLFIRI/bevacizumab today.  Irinotecan dose reduce to 75 mg/m2 Negative for [id number]*28   Chmotherapy-induced diarrhea, discussed with patient that her symptoms most probably will get worse with the resuming of Irinotecan.  Recommend Imodium as needed per instruction.  # chemotherapy induced neutropenia , patient will receive long-acting GCSF prophylaxis.   #Hypokalemia, potassium is stable.  Continue potassium chloride 20 mEq twice daily.  #History of pulmonary embolism, history of bilateral DVT Continue Eliquis 2.5 mg twice daily for anticoagulation prophylaxis.   Follow-up 2 weeks for next cycle of treatment.  Earlie Server, MD, PhD

## 2021-11-29 LAB — CEA: CEA: 599 ng/mL — ABNORMAL HIGH (ref 0.0–4.7)

## 2021-11-30 ENCOUNTER — Other Ambulatory Visit: Payer: Self-pay

## 2021-11-30 ENCOUNTER — Inpatient Hospital Stay: Payer: Medicare Other

## 2021-11-30 VITALS — BP 129/61 | HR 66 | Temp 97.9°F | Resp 18

## 2021-11-30 DIAGNOSIS — Z800 Family history of malignant neoplasm of digestive organs: Secondary | ICD-10-CM | POA: Diagnosis not present

## 2021-11-30 DIAGNOSIS — R634 Abnormal weight loss: Secondary | ICD-10-CM | POA: Diagnosis not present

## 2021-11-30 DIAGNOSIS — I10 Essential (primary) hypertension: Secondary | ICD-10-CM | POA: Diagnosis not present

## 2021-11-30 DIAGNOSIS — Z5111 Encounter for antineoplastic chemotherapy: Secondary | ICD-10-CM | POA: Diagnosis not present

## 2021-11-30 DIAGNOSIS — C20 Malignant neoplasm of rectum: Secondary | ICD-10-CM

## 2021-11-30 DIAGNOSIS — Z87891 Personal history of nicotine dependence: Secondary | ICD-10-CM | POA: Diagnosis not present

## 2021-11-30 DIAGNOSIS — R609 Edema, unspecified: Secondary | ICD-10-CM | POA: Diagnosis not present

## 2021-11-30 DIAGNOSIS — K435 Parastomal hernia without obstruction or  gangrene: Secondary | ICD-10-CM | POA: Diagnosis not present

## 2021-11-30 DIAGNOSIS — Z5189 Encounter for other specified aftercare: Secondary | ICD-10-CM | POA: Diagnosis not present

## 2021-11-30 DIAGNOSIS — Z86711 Personal history of pulmonary embolism: Secondary | ICD-10-CM | POA: Diagnosis not present

## 2021-11-30 DIAGNOSIS — K5900 Constipation, unspecified: Secondary | ICD-10-CM | POA: Diagnosis not present

## 2021-11-30 DIAGNOSIS — Z933 Colostomy status: Secondary | ICD-10-CM | POA: Diagnosis not present

## 2021-11-30 DIAGNOSIS — R197 Diarrhea, unspecified: Secondary | ICD-10-CM | POA: Diagnosis not present

## 2021-11-30 DIAGNOSIS — E876 Hypokalemia: Secondary | ICD-10-CM | POA: Diagnosis not present

## 2021-11-30 DIAGNOSIS — M47816 Spondylosis without myelopathy or radiculopathy, lumbar region: Secondary | ICD-10-CM | POA: Diagnosis not present

## 2021-11-30 DIAGNOSIS — Z7901 Long term (current) use of anticoagulants: Secondary | ICD-10-CM | POA: Diagnosis not present

## 2021-11-30 DIAGNOSIS — I2510 Atherosclerotic heart disease of native coronary artery without angina pectoris: Secondary | ICD-10-CM | POA: Diagnosis not present

## 2021-11-30 DIAGNOSIS — Z86718 Personal history of other venous thrombosis and embolism: Secondary | ICD-10-CM | POA: Diagnosis not present

## 2021-11-30 DIAGNOSIS — C799 Secondary malignant neoplasm of unspecified site: Secondary | ICD-10-CM

## 2021-11-30 DIAGNOSIS — K219 Gastro-esophageal reflux disease without esophagitis: Secondary | ICD-10-CM | POA: Diagnosis not present

## 2021-11-30 DIAGNOSIS — R5383 Other fatigue: Secondary | ICD-10-CM | POA: Diagnosis not present

## 2021-11-30 DIAGNOSIS — E7800 Pure hypercholesterolemia, unspecified: Secondary | ICD-10-CM | POA: Diagnosis not present

## 2021-11-30 MED ORDER — HEPARIN SOD (PORK) LOCK FLUSH 100 UNIT/ML IV SOLN
INTRAVENOUS | Status: AC
  Filled 2021-11-30: qty 5

## 2021-11-30 MED ORDER — PEGFILGRASTIM-BMEZ 6 MG/0.6ML ~~LOC~~ SOSY
6.0000 mg | PREFILLED_SYRINGE | Freq: Once | SUBCUTANEOUS | Status: AC
  Administered 2021-11-30: 6 mg via SUBCUTANEOUS
  Filled 2021-11-30: qty 0.6

## 2021-11-30 MED ORDER — HEPARIN SOD (PORK) LOCK FLUSH 100 UNIT/ML IV SOLN
500.0000 [IU] | Freq: Once | INTRAVENOUS | Status: AC | PRN
  Administered 2021-11-30: 500 [IU]
  Filled 2021-11-30: qty 5

## 2021-11-30 MED ORDER — SODIUM CHLORIDE 0.9% FLUSH
10.0000 mL | INTRAVENOUS | Status: DC | PRN
  Administered 2021-11-30: 10 mL
  Filled 2021-11-30: qty 10

## 2021-11-30 NOTE — Patient Instructions (Signed)

## 2021-12-01 ENCOUNTER — Other Ambulatory Visit: Payer: Self-pay | Admitting: Family Medicine

## 2021-12-01 DIAGNOSIS — I825Z3 Chronic embolism and thrombosis of unspecified deep veins of distal lower extremity, bilateral: Secondary | ICD-10-CM | POA: Diagnosis not present

## 2021-12-01 DIAGNOSIS — I129 Hypertensive chronic kidney disease with stage 1 through stage 4 chronic kidney disease, or unspecified chronic kidney disease: Secondary | ICD-10-CM | POA: Diagnosis not present

## 2021-12-01 DIAGNOSIS — D701 Agranulocytosis secondary to cancer chemotherapy: Secondary | ICD-10-CM | POA: Diagnosis not present

## 2021-12-01 DIAGNOSIS — Z7901 Long term (current) use of anticoagulants: Secondary | ICD-10-CM | POA: Diagnosis not present

## 2021-12-01 DIAGNOSIS — M1990 Unspecified osteoarthritis, unspecified site: Secondary | ICD-10-CM | POA: Diagnosis not present

## 2021-12-01 DIAGNOSIS — K219 Gastro-esophageal reflux disease without esophagitis: Secondary | ICD-10-CM | POA: Diagnosis not present

## 2021-12-01 DIAGNOSIS — Z86711 Personal history of pulmonary embolism: Secondary | ICD-10-CM | POA: Diagnosis not present

## 2021-12-01 DIAGNOSIS — N182 Chronic kidney disease, stage 2 (mild): Secondary | ICD-10-CM | POA: Diagnosis not present

## 2021-12-01 DIAGNOSIS — L89312 Pressure ulcer of right buttock, stage 2: Secondary | ICD-10-CM | POA: Diagnosis not present

## 2021-12-01 DIAGNOSIS — Z87891 Personal history of nicotine dependence: Secondary | ICD-10-CM | POA: Diagnosis not present

## 2021-12-01 DIAGNOSIS — Z4800 Encounter for change or removal of nonsurgical wound dressing: Secondary | ICD-10-CM | POA: Diagnosis not present

## 2021-12-01 DIAGNOSIS — Z933 Colostomy status: Secondary | ICD-10-CM | POA: Diagnosis not present

## 2021-12-01 DIAGNOSIS — Z9181 History of falling: Secondary | ICD-10-CM | POA: Diagnosis not present

## 2021-12-01 DIAGNOSIS — C20 Malignant neoplasm of rectum: Secondary | ICD-10-CM | POA: Diagnosis not present

## 2021-12-01 DIAGNOSIS — J3089 Other allergic rhinitis: Secondary | ICD-10-CM

## 2021-12-01 DIAGNOSIS — E876 Hypokalemia: Secondary | ICD-10-CM | POA: Diagnosis not present

## 2021-12-01 DIAGNOSIS — E782 Mixed hyperlipidemia: Secondary | ICD-10-CM | POA: Diagnosis not present

## 2021-12-01 DIAGNOSIS — T451X5D Adverse effect of antineoplastic and immunosuppressive drugs, subsequent encounter: Secondary | ICD-10-CM | POA: Diagnosis not present

## 2021-12-01 DIAGNOSIS — Z79899 Other long term (current) drug therapy: Secondary | ICD-10-CM | POA: Diagnosis not present

## 2021-12-01 DIAGNOSIS — C7982 Secondary malignant neoplasm of genital organs: Secondary | ICD-10-CM | POA: Diagnosis not present

## 2021-12-01 NOTE — Telephone Encounter (Signed)
Requested medication (s) are due for refill today: yes  Requested medication (s) are on the active medication list: yes  Last refill:  06/29/21 #16g/1  Future visit scheduled: no  Notes to clinic:  Unable to refill per protocol, cannot delegate.      Requested Prescriptions  Pending Prescriptions Disp Refills   fluticasone (FLONASE) 50 MCG/ACT nasal spray [Pharmacy Med Name: Fluticasone Propionate 50 MCG/ACT Nasal Suspension] 16 g 0    Sig: USE 1 SPRAY IN EACH NOSTRIL ONCE DAILY     Not Delegated - Ear, Nose, and Throat: Nasal Preparations - Corticosteroids Failed - 12/01/2021  8:47 AM      Failed - This refill cannot be delegated      Passed - Valid encounter within last 12 months    Recent Outpatient Visits           5 months ago Annual physical exam   [place], [place]   11 months ago Benign hypertension with CKD (chronic kidney disease), stage II   [facility] [place], Devonne Doughty, DO   1 year ago [place], DO       Future Appointments             In 3 weeks [place], Devonne Doughty, DO [facility], [place]   In 7 months  [facility], [facility]

## 2021-12-08 ENCOUNTER — Ambulatory Visit: Payer: Medicare Other | Admitting: Podiatry

## 2021-12-08 ENCOUNTER — Encounter: Payer: Self-pay | Admitting: Podiatry

## 2021-12-08 ENCOUNTER — Other Ambulatory Visit: Payer: Self-pay

## 2021-12-08 DIAGNOSIS — D689 Coagulation defect, unspecified: Secondary | ICD-10-CM

## 2021-12-08 DIAGNOSIS — B351 Tinea unguium: Secondary | ICD-10-CM | POA: Diagnosis not present

## 2021-12-08 DIAGNOSIS — M79675 Pain in left toe(s): Secondary | ICD-10-CM

## 2021-12-08 DIAGNOSIS — M79674 Pain in right toe(s): Secondary | ICD-10-CM

## 2021-12-08 NOTE — Progress Notes (Signed)
°  Subjective:  Patient ID: Jacqueline Yoder, female    DOB: 1945-12-30,  MRN: [id number]  Chief Complaint  Patient presents with   Nail Problem    "Trim my toenails."   76 y.o. female returns for the above complaint.  Patient presents with thickened elongated dystrophic toenails x10.  Patient states painful to touch.  She is not able to debride herself.  She would like for me to do it.  She denies any other acute complaints.  She states that she is on Eliquis from previous history of PE.  Objective:  There were no vitals filed for this visit. Podiatric Exam: Vascular: dorsalis pedis and posterior tibial pulses are palpable bilateral. Capillary return is immediate. Temperature gradient is WNL. Skin turgor WNL  Sensorium: Normal Semmes Weinstein monofilament test. Normal tactile sensation bilaterally. Nail Exam: Pt has thick disfigured discolored nails with subungual debris noted bilateral entire nail hallux through fifth toenails.  Pain on palpation to the nails. Ulcer Exam: There is no evidence of ulcer or pre-ulcerative changes or infection. Orthopedic Exam: Muscle tone and strength are WNL. No limitations in general ROM. No crepitus or effusions noted. HAV  B/L.  Hammer toes 2-5  B/L. Skin: No Porokeratosis. No infection or ulcers    Assessment & Plan:   No diagnosis found.    Patient was evaluated and treated and all questions answered.  Onychomycosis with pain  -Nails palliatively debrided as below. -Educated on self-care  Procedure: Nail Debridement Rationale: pain  Type of Debridement: manual, sharp debridement. Instrumentation: Nail nipper, rotary burr. Number of Nails: 10  Procedures and Treatment: Consent by patient was obtained for treatment procedures. The patient understood the discussion of treatment and procedures well. All questions were answered thoroughly reviewed. Debridement of mycotic and hypertrophic toenails, 1 through 5 bilateral and clearing of subungual  debris. No ulceration, no infection noted.  Return Visit-Office Procedure: Patient instructed to return to the office for a follow up visit 3 months for continued evaluation and treatment.  Boneta Lucks, DPM    No follow-ups on file.

## 2021-12-09 ENCOUNTER — Other Ambulatory Visit: Payer: Self-pay | Admitting: Family Medicine

## 2021-12-09 ENCOUNTER — Other Ambulatory Visit: Payer: Self-pay | Admitting: Oncology

## 2021-12-09 DIAGNOSIS — Z1231 Encounter for screening mammogram for malignant neoplasm of breast: Secondary | ICD-10-CM

## 2021-12-12 ENCOUNTER — Other Ambulatory Visit: Payer: Self-pay

## 2021-12-12 ENCOUNTER — Inpatient Hospital Stay: Payer: Medicare Other

## 2021-12-12 ENCOUNTER — Encounter: Payer: Self-pay | Admitting: Oncology

## 2021-12-12 ENCOUNTER — Inpatient Hospital Stay (HOSPITAL_BASED_OUTPATIENT_CLINIC_OR_DEPARTMENT_OTHER): Payer: Medicare Other | Admitting: Oncology

## 2021-12-12 VITALS — BP 132/59 | HR 70 | Temp 96.7°F | Resp 18 | Wt 159.8 lb

## 2021-12-12 DIAGNOSIS — E7800 Pure hypercholesterolemia, unspecified: Secondary | ICD-10-CM | POA: Diagnosis not present

## 2021-12-12 DIAGNOSIS — Z5111 Encounter for antineoplastic chemotherapy: Secondary | ICD-10-CM

## 2021-12-12 DIAGNOSIS — C20 Malignant neoplasm of rectum: Secondary | ICD-10-CM | POA: Diagnosis not present

## 2021-12-12 DIAGNOSIS — K219 Gastro-esophageal reflux disease without esophagitis: Secondary | ICD-10-CM | POA: Diagnosis not present

## 2021-12-12 DIAGNOSIS — C799 Secondary malignant neoplasm of unspecified site: Secondary | ICD-10-CM

## 2021-12-12 DIAGNOSIS — E876 Hypokalemia: Secondary | ICD-10-CM | POA: Diagnosis not present

## 2021-12-12 DIAGNOSIS — R5383 Other fatigue: Secondary | ICD-10-CM | POA: Diagnosis not present

## 2021-12-12 DIAGNOSIS — T451X5A Adverse effect of antineoplastic and immunosuppressive drugs, initial encounter: Secondary | ICD-10-CM

## 2021-12-12 DIAGNOSIS — Z7901 Long term (current) use of anticoagulants: Secondary | ICD-10-CM | POA: Diagnosis not present

## 2021-12-12 DIAGNOSIS — Z5189 Encounter for other specified aftercare: Secondary | ICD-10-CM | POA: Diagnosis not present

## 2021-12-12 DIAGNOSIS — I2510 Atherosclerotic heart disease of native coronary artery without angina pectoris: Secondary | ICD-10-CM | POA: Diagnosis not present

## 2021-12-12 DIAGNOSIS — R634 Abnormal weight loss: Secondary | ICD-10-CM | POA: Diagnosis not present

## 2021-12-12 DIAGNOSIS — Z800 Family history of malignant neoplasm of digestive organs: Secondary | ICD-10-CM | POA: Diagnosis not present

## 2021-12-12 DIAGNOSIS — R609 Edema, unspecified: Secondary | ICD-10-CM | POA: Diagnosis not present

## 2021-12-12 DIAGNOSIS — Z86718 Personal history of other venous thrombosis and embolism: Secondary | ICD-10-CM | POA: Diagnosis not present

## 2021-12-12 DIAGNOSIS — Z933 Colostomy status: Secondary | ICD-10-CM | POA: Diagnosis not present

## 2021-12-12 DIAGNOSIS — K5900 Constipation, unspecified: Secondary | ICD-10-CM | POA: Diagnosis not present

## 2021-12-12 DIAGNOSIS — Z87891 Personal history of nicotine dependence: Secondary | ICD-10-CM | POA: Diagnosis not present

## 2021-12-12 DIAGNOSIS — I10 Essential (primary) hypertension: Secondary | ICD-10-CM | POA: Diagnosis not present

## 2021-12-12 DIAGNOSIS — Z86711 Personal history of pulmonary embolism: Secondary | ICD-10-CM

## 2021-12-12 DIAGNOSIS — M47816 Spondylosis without myelopathy or radiculopathy, lumbar region: Secondary | ICD-10-CM | POA: Diagnosis not present

## 2021-12-12 DIAGNOSIS — R197 Diarrhea, unspecified: Secondary | ICD-10-CM | POA: Diagnosis not present

## 2021-12-12 DIAGNOSIS — K521 Toxic gastroenteritis and colitis: Secondary | ICD-10-CM | POA: Diagnosis not present

## 2021-12-12 DIAGNOSIS — K435 Parastomal hernia without obstruction or  gangrene: Secondary | ICD-10-CM | POA: Diagnosis not present

## 2021-12-12 LAB — COMPREHENSIVE METABOLIC PANEL
ALT: 15 U/L (ref 0–44)
AST: 25 U/L (ref 15–41)
Albumin: 4 g/dL (ref 3.5–5.0)
Alkaline Phosphatase: 60 U/L (ref 38–126)
Anion gap: 8 (ref 5–15)
BUN: 14 mg/dL (ref 8–23)
CO2: 27 mmol/L (ref 22–32)
Calcium: 9.1 mg/dL (ref 8.9–10.3)
Chloride: 101 mmol/L (ref 98–111)
Creatinine, Ser: 0.82 mg/dL (ref 0.44–1.00)
GFR, Estimated: >60 mL/min (ref >60)
Glucose, Bld: 96 mg/dL (ref 70–99)
Potassium: 4 mmol/L (ref 3.5–5.1)
Sodium: 136 mmol/L (ref 135–145)
Total Bilirubin: <0.1 mg/dL — ABNORMAL LOW (ref 0.3–1.2)
Total Protein: 6.8 g/dL (ref 6.5–8.1)

## 2021-12-12 LAB — CBC WITH DIFFERENTIAL/PLATELET
Abs Immature Granulocytes: 0.01 10*3/uL (ref 0.00–0.07)
Basophils Absolute: 0 10*3/uL (ref 0.0–0.1)
Basophils Relative: 0 %
Eosinophils Absolute: 0.1 10*3/uL (ref 0.0–0.5)
Eosinophils Relative: 3 %
HCT: 37.8 % (ref 36.0–46.0)
Hemoglobin: 11.9 g/dL — ABNORMAL LOW (ref 12.0–15.0)
Immature Granulocytes: 0 %
Lymphocytes Relative: 26 %
Lymphs Abs: 1.3 10*3/uL (ref 0.7–4.0)
MCH: 30.7 pg (ref 26.0–34.0)
MCHC: 31.5 g/dL (ref 30.0–36.0)
MCV: 97.4 fL (ref 80.0–100.0)
Monocytes Absolute: 0.5 10*3/uL (ref 0.1–1.0)
Monocytes Relative: 9 %
Neutro Abs: 3.2 10*3/uL (ref 1.7–7.7)
Neutrophils Relative %: 62 %
Platelets: 209 10*3/uL (ref 150–400)
RBC: 3.88 MIL/uL (ref 3.87–5.11)
RDW: 17.4 % — ABNORMAL HIGH (ref 11.5–15.5)
WBC: 5.2 10*3/uL (ref 4.0–10.5)
nRBC: 0 % (ref 0.0–0.2)

## 2021-12-12 LAB — PROTEIN, URINE, RANDOM: Total Protein, Urine: 12 mg/dL

## 2021-12-12 MED ORDER — SODIUM CHLORIDE 0.9 % IV SOLN
2400.0000 mg/m2 | INTRAVENOUS | Status: DC
  Administered 2021-12-12: 4300 mg via INTRAVENOUS
  Filled 2021-12-12: qty 86

## 2021-12-12 MED ORDER — DIPHENHYDRAMINE HCL 50 MG/ML IJ SOLN
INTRAMUSCULAR | Status: AC
  Filled 2021-12-12: qty 1

## 2021-12-12 MED ORDER — SODIUM CHLORIDE 0.9 % IV SOLN
Freq: Once | INTRAVENOUS | Status: AC
  Filled 2021-12-12: qty 250

## 2021-12-12 MED ORDER — ATROPINE SULFATE 1 MG/ML IV SOLN
0.4000 mg | Freq: Once | INTRAVENOUS | Status: AC
  Administered 2021-12-12: 0.4 mg via INTRAVENOUS
  Filled 2021-12-12: qty 1

## 2021-12-12 MED ORDER — SODIUM CHLORIDE 0.9 % IV SOLN
10.0000 mg | Freq: Once | INTRAVENOUS | Status: AC
  Administered 2021-12-12: 10 mg via INTRAVENOUS
  Filled 2021-12-12: qty 10

## 2021-12-12 MED ORDER — SODIUM CHLORIDE 0.9 % IV SOLN
350.0000 mg | Freq: Once | INTRAVENOUS | Status: AC
  Administered 2021-12-12: 350 mg via INTRAVENOUS
  Filled 2021-12-12: qty 14

## 2021-12-12 MED ORDER — SODIUM CHLORIDE 0.9 % IV SOLN
750.0000 mg | Freq: Once | INTRAVENOUS | Status: AC
  Administered 2021-12-12: 750 mg via INTRAVENOUS
  Filled 2021-12-12: qty 37.5

## 2021-12-12 MED ORDER — PALONOSETRON HCL INJECTION 0.25 MG/5ML
0.2500 mg | Freq: Once | INTRAVENOUS | Status: AC
  Administered 2021-12-12: 0.25 mg via INTRAVENOUS
  Filled 2021-12-12: qty 5

## 2021-12-12 MED ORDER — DIPHENHYDRAMINE HCL 50 MG/ML IJ SOLN
25.0000 mg | Freq: Every day | INTRAMUSCULAR | Status: DC | PRN
  Administered 2021-12-12: 25 mg via INTRAVENOUS

## 2021-12-12 MED ORDER — SODIUM CHLORIDE 0.9 % IV SOLN
100.0000 mg/m2 | Freq: Once | INTRAVENOUS | Status: AC
  Administered 2021-12-12: 180 mg via INTRAVENOUS
  Filled 2021-12-12: qty 4

## 2021-12-12 NOTE — Progress Notes (Signed)
Hematology/Oncology progress note [phone]    Patient Care Team: Olin Hauser, DO as PCP - General (Family Medicine) Clent Jacks, RN as Registered Nurse Earlie Server, MD as Consulting Physician (Hematology and Oncology)  CHIEF COMPLAINTS/REASON FOR VISIT:  Follow up for rectal cancer  HISTORY OF PRESENTING ILLNESS:  Patient initially presented with complaints of postmenopausal bleeding on 08/16/2018.  History of was menopausal vaginal bleeding in 2016 which resulted in cervical polypectomy.  Pathology 02/04/2015 showed cervical polyp, consistent with benign endometrial polyp.  [name] after polypectomy due to anxiety associated with pelvic exams.  pelvic exam on 08/16/2018 reviewed cervical abnormality and from enlarged uterus. Seen by Dr. Marcelline Mates on 10/29/2018.  Endometrial biopsy and a Pap smear was performed. 10/29/2018 Pap smear showed adenocarcinoma, favor endometrial origin. 10/29/2018 endometrial biopsy showed endometrioid carcinoma, FIGO grade 1.  10/29/2018- TA & TV Ultrasound revealed: Anteverted uterus measuring 8.7 x 5.6 x 6.4 cm without evidence of focal masses.  The endometrium measuring 24.1 mm (thickened) and heterogeneous.  Right and left ovaries not visualized.  No adnexal masses identified.  No free fluid in cul-de-sac.  Patient was seen by Dr. Theora Gianotti in clinic on 11/13/2018.  Cervical exam reveals 2 cm exophytic irregular mass consistent with malignancy.   11/19/2018 CT chest abdomen pelvis with contrast showed thickened endometrium with some irregularity compatible with the provided diagnosis of endometrial malignancy.  There is a mildly prominent left inguinal node 1.4 cm.  Patient was seen by Dr. Fransisca Connors on 11/20/2018 and left groin lymph node biopsy was recommended.  11/26/2018 patient underwent left inguinal lymph node biopsy. Pathology showed metastatic adenocarcinoma consistent with colorectal origin.  CDX 2+.  Case was  discussed on tumor board.  Recommend colonoscopy for further evaluation.  Patient reports significant weight loss 30 pounds over the last year.  Chronic vaginal spotting. Change of bowel habits the past few months.  More constipated.  Family history positive for brother who has colon cancer prostate cancer.  patient has underwent colonoscopy on 12/03/2018 which reviewed a nonobstructing large mass in the rectum.  Also chronic fistula.  Mass was not circumferential.  This was biopsied with a cold forceps for histology.  Pathology came back hyperplastic polyp negative for dysplasia and malignancy. [name], patient underwent flex sigmoidoscopy on 12/06/2018 with rebiopsy of the rectal mass. This time biopsy results came back positive for invasive colorectal adenocarcinoma, moderately differentiated. Immunotherapy for nearly mismatch repair protein (MMR ) was performed.  There is no loss of MMR expression.  low probability of MSI high.   # Seen by [name] for evaluation of resectability for rectal cancer. In addition, she also had a second opinion with [facility] where her endometrial biopsy pathology was changed to  adenocarcinoma, consistent with colorectal primary.   [name]. She has home health that has been assisting with ostomy care  Patient was also evaluated by [facility] oncology.  Recommendation is to proceed with TNT with concurrent chemoradiation followed by neoadjuvant chemotherapy followed by surgical resection. Patient prefers to have treatment done locally with [facility].   # Oncology Treatment:  02/03/2019- 03/19/2019  concurrent Xeloda and radiation.  Xeloda dose 825m /m2 BID - rounded to 16515mBID- on days of radiation. 04/09/2019, started on FOLFOX with bolus early.  Omitted.  07/16/2019 finished 8 cycles of FOLFOX. 09/17/19 APR/posterior vaginectomy/TAH/BSO/VY-flap pT4b pN0 with close vaginal margin 0.2 mm.  Uterus and  ovaries negative for malignancy. Patient reports  bilateral lower extremity numbness and tingling, intermittent, left worse than right. She has lost a lot of weight since her APR surgery.   #Family history with half brother having's history of colon cancer prostate cancer.  Personal history of colorectal cancer.  Patient has not decided if she wants genetic testing.    # history of PE( 01/13/2020)  in the bilateral lower extremity DVT (01/13/2020).   She finishes 6 months of anticoagulation with Eliquis 5 mg twice daily. Now switched to Eliquis 2.5 mg twice daily..  # She has now developed recurrent disease. #06/30/20  vaginal introitus mass biopsied. Pathology is consistent with metastatic colorectal adenocarcinoma I have discussed with [name]  Dr. Hester Mates and the mass is not resectable. Patient has also had colonoscopy by Dr. Vicente Males yesterday. Normal examination. # 07/16/2020 cycle 1 FOLFIRI  # 07/20/2020 PET scan was done for further evaluation, images are consistent with local recurrence, no distant metastasis. #Discussed with radiation oncology Dr. Baruch Gouty will recommends concurrent chemotherapy and radiation. 08/02/2020-08/16/2020, patient starts radiation.  Xeloda was held due to neutropenia 08/17/2020,-09/06/2020 Xeloda 1500 mg twice daily concurrently with radiation  01/31/21 started on FOLFIRI + Bev 05/18/2021 CT chest abdomen pelvis showed Previously noted enlargement of bilateral inguinal lymph nodes is resolved, consistent with treatment response of nodal metastatic disease. Interval decrease in size of multiple small bilateral pulmonary nodules, consistent with treatment response of pulmonary metastatic disease. No evidence of new metastatic disease. 05/24/2021 - 08/30/2021, continued on FOLFIRI plus bevacizumab.  Irinotecan dose was reduced, eventually 19m/m2  09/02/2021, CT chest abdomen pelvis without contrast Showed small bilateral pulmonary nodules, unchanged.  Stable metastatic  disease.  No noncontrast evidence of new metastatic disease in the chest abdomen pelvis.  Small parastomal hernia.  Enlargement of main pulmonary artery.  Coronary artery disease.  09/13/2021, maintenance 5-FU/bevacizumab 11/28/2021, 5-FU/Irinotecan/bevacizumab.  Irinotecan 100 mg/m2 was added back due to progressively increasing CEA.  INTERVAL HISTORY KALETTE KATAOKAis a 76y.o. female who has above history reviewed by me presents for follow-up of rectal cancer. Currently on dose reduced FOLFIRI/bevacizumab  Patient reports manageable chemotherapy-induced diarrhea with the use of antidiarrhea medication. She was accompanied by daughter.  No new complaints.    Review of Systems  Constitutional:  Positive for fatigue. Negative for appetite change, chills, fever and unexpected weight change.  HENT:   Negative for hearing loss and voice change.   Eyes:  Negative for eye problems.  Respiratory:  Negative for chest tightness and cough.   Cardiovascular:  Negative for chest pain.  Gastrointestinal:  Positive for diarrhea. Negative for abdominal distention, abdominal pain, blood in stool, constipation and nausea.  Endocrine: Negative for hot flashes.  Genitourinary:  Negative for difficulty urinating and frequency.   Musculoskeletal:  Negative for arthralgias.  Skin:  Negative for itching and rash.  Neurological:  Negative for extremity weakness and numbness.  Hematological:  Negative for adenopathy.  Psychiatric/Behavioral:  Negative for confusion.    MEDICAL HISTORY:  Past Medical History:  Diagnosis Date   Allergy    Arthritis    Blood clot in vein    Family history of colon cancer    GERD (gastroesophageal reflux disease)    Hypercholesteremia    Hypertension    Hypertension    Lower extremity edema    [place]al cancer ([place] 12[place]   Urinary incontinence     SURGICAL HISTORY: Past Surgical History:  Procedure Laterality Date   [place]  COLONOSCOPY WITH PROPOFOL N/A 12/03/2018   Procedure: COLONOSCOPY WITH PROPOFOL;  Surgeon: Lucilla Lame, MD;  Location: [facility] ENDOSCOPY;  Service: Endoscopy;  Laterality: N/A;   COLONOSCOPY WITH PROPOFOL N/A 07/15/2020   Procedure: COLONOSCOPY WITH PROPOFOL;  Surgeon: [name], MD;  Location: [facility] ENDOSCOPY;  Service: Gastroenterology;  Laterality: N/A;   FLEXIBLE SIGMOIDOSCOPY N/A 12/06/2018   Procedure: FLEXIBLE SIGMOIDOSCOPY;  Surgeon: [name], MD;  Location: [facility] ENDOSCOPY;  Service: Endoscopy;  Laterality: N/A;   LAPAROSCOPIC COLOSTOMY  01/06/2019   PORTACATH PLACEMENT N/A 04/03/2019   Procedure: INSERTION PORT-A-CATH;  Surgeon: Jules Husbands, MD;  Location: [facility] ORS;  Service: General;  Laterality: N/A;    SOCIAL HISTORY: Social History   Socioeconomic History   Marital status: Widowed    Spouse name: Not on file   Number of children: Not on file   Years of education: Not on file   Highest education level: Not on file  Occupational History   Not on file  Tobacco Use   Smoking status: Former    Types: Cigarettes    Quit date: 12/02/1977    Years since quitting: 44.0   Smokeless tobacco: Former  Scientific laboratory technician Use: Never used  Substance and Sexual Activity   Alcohol use: Never   Drug use: Never   Sexual activity: Not Currently    Birth control/protection: None  Other Topics Concern   Not on file  Social History Narrative   Lives with daughter   Social Determinants of Health   Financial Resource Strain: Low Risk    Difficulty of Paying Living Expenses: Not hard at all  Food Insecurity: No Food Insecurity   Worried About Charity fundraiser in the Last Year: Never true   [place] in the Last Year: Never true  Transportation Needs: No Transportation Needs   Lack of Transportation (Medical): No   Lack of Transportation (Non-Medical): No  Physical Activity: Inactive   Days of Exercise per Week: 0 days   Minutes of Exercise per Session: 0 min   Stress: No Stress Concern Present   Feeling of Stress : Not at all  Social Connections: Not on file  Intimate Partner Violence: Not on file    FAMILY HISTORY: Family History  Problem Relation Age of Onset   Colon cancer Brother 29       exposure to chemicals [place]   Hypertension Mother    Stroke Mother    Kidney failure Father    Breast cancer Neg Hx    Ovarian cancer Neg Hx     ALLERGIES:  is allergic to sulfamethoxazole-trimethoprim.  MEDICATIONS:  Current Outpatient Medications  Medication Sig Dispense Refill   Cholecalciferol (VITAMIN D3) 2000 units capsule Take 2,000 Units by mouth daily.     diclofenac sodium (VOLTAREN) 1 % GEL Apply 2 g topically 4 (four) times daily as needed (joint pain).  11   ELIQUIS 2.5 MG TABS tablet Take 1 tablet by mouth twice daily 60 tablet 2   fluticasone (FLONASE) 50 MCG/ACT nasal spray USE 1 SPRAY IN EACH NOSTRIL ONCE DAILY 16 g 3   gabapentin (NEURONTIN) 100 MG capsule Take 1 capsule (100 mg total) by mouth at bedtime. 30 capsule 0   ipratropium (ATROVENT) 0.03 % nasal spray Place 1 spray into both nostrils 2 (two) times daily. 30 mL 2   lidocaine-prilocaine (EMLA) cream Apply 1 application topically as needed. 30 g 6   loratadine (CLARITIN) 10 MG tablet Take 10 mg  by mouth daily.     Multiple Vitamins-Minerals (ONE-A-DAY WOMENS 50 PLUS PO) Take 1 tablet by mouth daily.      ondansetron (ZOFRAN) 8 MG tablet Take 1 tablet (8 mg total) by mouth 2 (two) times daily as needed for refractory nausea / vomiting. Start on day 3 after chemotherapy. 30 tablet 1   potassium chloride SA (KLOR-CON M) 20 MEQ tablet Take 2 tablets (40 mEq total) by mouth 2 (two) times daily. 120 tablet 1   prochlorperazine (COMPAZINE) 10 MG tablet Take 1 tablet (10 mg total) by mouth every 6 (six) hours as needed (NAUSEA). 30 tablet 1   simvastatin (ZOCOR) 40 MG tablet Take 1 tablet (40 mg total) by mouth at bedtime. 90 tablet 3   triamterene-hydrochlorothiazide  (DYAZIDE) 37.5-25 MG capsule Take 1 each (1 capsule total) by mouth daily. 90 capsule 3   zinc gluconate 50 MG tablet Take 50 mg by mouth daily.     diphenoxylate-atropine (LOMOTIL) 2.5-0.025 MG tablet Take 1 tablet by mouth 4 (four) times daily as needed for diarrhea or loose stools. (Patient not taking: Reported on 09/27/2021) 30 tablet 1   loperamide (IMODIUM) 2 MG capsule Take 1 capsule (2 mg total) by mouth See admin instructions. With onset of loose stool, take 62m followed by 263mevery 2 hours,  Maximum: 16 mg/day (Patient not taking: Reported on 09/27/2021) 120 capsule 1   No current facility-administered medications for this visit.     PHYSICAL EXAMINATION: ECOG PERFORMANCE STATUS: 1 - Symptomatic but completely ambulatory  Physical Exam Constitutional:      General: She is not in acute distress. HENT:     Head: Normocephalic and atraumatic.  Eyes:     General: No scleral icterus. Cardiovascular:     Rate and Rhythm: Normal rate and regular rhythm.     Heart sounds: Normal heart sounds.  Pulmonary:     Effort: Pulmonary effort is normal. No respiratory distress.     Breath sounds: No wheezing.  Abdominal:     General: Bowel sounds are normal. There is no distension.     Palpations: Abdomen is soft.     Comments: + Colostomy bag   Musculoskeletal:        General: No deformity. Normal range of motion.     Cervical back: Normal range of motion and neck supple.  Skin:    General: Skin is warm and dry.     Findings: No erythema or rash.  Neurological:     Mental Status: She is alert and oriented to person, place, and time. Mental status is at baseline.     Cranial Nerves: No cranial nerve deficit.     Coordination: Coordination normal.      LABORATORY DATA:  I have reviewed the data as listed Lab Results  Component Value Date   WBC 5.2 12/12/2021   HGB 11.9 (L) 12/12/2021   HCT 37.8 12/12/2021   MCV 97.4 12/12/2021   PLT 209 12/12/2021   Recent Labs     11/09/21 0800 11/28/21 0843 12/12/21 0841  NA 139 136 136  K 4.1 4.1 4.0  CL 103 102 101  CO2 '30 27 27  ' GLUCOSE 109* 96 96  BUN '9 15 14  ' CREATININE 0.81 0.80 0.82  CALCIUM 9.6 9.4 9.1  GFRNONAA >60 >60 >60  PROT 6.6 6.8 6.8  ALBUMIN 4.1 4.0 4.0  AST '26 28 25  ' ALT '16 18 15  ' ALKPHOS 62 58 60  BILITOT 0.3 0.2* <0.1*  Iron/TIBC/Ferritin/ %Sat    Component Value Date/Time   IRON 40 06/23/2020 1124   TIBC 246 (L) 06/23/2020 1124   FERRITIN 338 (H) 06/23/2020 1124   IRONPCTSAT 16 06/23/2020 1124     RADIOGRAPHIC STUDIES: I have personally reviewed the radiological images as listed and agreed with the findings in the report. CT CHEST ABDOMEN PELVIS W CONTRAST  Result Date: 11/01/2021 CLINICAL DATA:  Restaging rectal cancer, elevated CEA level. Prior APR. EXAM: CT CHEST, ABDOMEN, AND PELVIS WITH CONTRAST TECHNIQUE: Multidetector CT imaging of the chest, abdomen and pelvis was performed following the standard protocol during bolus administration of intravenous contrast. RADIATION DOSE REDUCTION: This exam was performed according to the departmental dose-optimization program which includes automated exposure control, adjustment of the mA and/or kV according to patient size and/or use of iterative reconstruction technique. CONTRAST:  132m OMNIPAQUE IOHEXOL 300 MG/ML  SOLN COMPARISON:  Multiple exams, including 09/01/2021 FINDINGS: CT CHEST FINDINGS Cardiovascular: Right Port-A-Cath tip: Right atrium. Coronary, aortic arch, and branch vessel atherosclerotic vascular disease. Stable prominence of the main pulmonary artery. Mediastinum/Nodes: No pathologic adenopathy identified. Lungs/Pleura: Index right middle lobe nodule measures 6 by 4 by 6 mm on image 97 series 4, previously 6 by 4 by 6 mm by my measurements, stable. A 3 by 3 mm left upper lobe nodule on image 44 series 4 previously measured 2 by 3 mm. Other scattered tiny pulmonary nodules are stable. Musculoskeletal: Unremarkable CT  ABDOMEN PELVIS FINDINGS Hepatobiliary: Prior cholecystectomy. Mild to moderate intrahepatic biliary dilatation. Common bile duct 8 mm in diameter. Overall appearance similar to prior. No well-defined focal hepatic metastatic lesion. Pancreas: Pancreatic duct dilatation, measuring 3.5 mm in the pancreatic body and 5 mm in diameter in the pancreatic head near the ampulla. This is similar to prior exams such as 05/18/2021. No focal pancreatic lesion identified. Spleen: Unremarkable Adrenals/Urinary Tract: Bilateral renal cysts are again observed. The adrenal glands appear normal. Stomach/Bowel: Prior APR with left colostomy. Normal appendix. No dilated bowel. As on the prior exam there is some prominence of fatty tissues along the stoma suggesting peristomal herniation of omental adipose tissue but no herniation of bowel pelvic floor laxity along the APR with bulging of tissues including loops of small bowel on image 113 series 6. Vascular/Lymphatic: Atherosclerosis is present, including aortoiliac atherosclerotic disease. No pathologic adenopathy observed. Reproductive: Uterus absent.  Pelvic floor laxity. Other: No supplemental non-categorized findings. Musculoskeletal: Lumbar spondylosis and degenerative disc disease. IMPRESSION: 1. Most of the small pulmonary nodules are stable compared to the prior exam, including the index 6 by 4 by 6 mm right middle lobe nodule. A 3 by 3 mm left upper lobe nodule previously measured 2 by 3 mm and may merit attention on surveillance imaging, although this slight difference in size measurement may be due to slice selection. 2. Stable appearance mild to moderate intrahepatic biliary dilatation and mild extrahepatic biliary dilatation along with moderate dorsal pancreatic duct dilatation. Some of the biliary prominence may be attributable to prior cholecystectomy. 3. Prior APR with left colostomy. Peristomal hernia contains omental adipose tissue but no bowel. 4. Other imaging  findings of potential clinical significance: Aortic Atherosclerosis (ICD10-I70.0). Coronary atherosclerosis. Stable prominence the main pulmonary artery suggesting pulmonary arterial hypertension. Bilateral renal cysts. Pelvic floor laxity with some bulging of small bowel loops along the pelvic floor. Lumbar spondylosis and degenerative disc disease. Electronically Signed   By: WVan ClinesM.D.   On: 11/01/2021 15:27      CT CHEST ABDOMEN PELVIS W  CONTRAST  Result Date: 11/01/2021 CLINICAL DATA:  Restaging rectal cancer, elevated CEA level. Prior APR. EXAM: CT CHEST, ABDOMEN, AND PELVIS WITH CONTRAST TECHNIQUE: Multidetector CT imaging of the chest, abdomen and pelvis was performed following the standard protocol during bolus administration of intravenous contrast. RADIATION DOSE REDUCTION: This exam was performed according to the departmental dose-optimization program which includes automated exposure control, adjustment of the mA and/or kV according to patient size and/or use of iterative reconstruction technique. CONTRAST:  166m OMNIPAQUE IOHEXOL 300 MG/ML  SOLN COMPARISON:  Multiple exams, including 09/01/2021 FINDINGS: CT CHEST FINDINGS Cardiovascular: Right Port-A-Cath tip: Right atrium. Coronary, aortic arch, and branch vessel atherosclerotic vascular disease. Stable prominence of the main pulmonary artery. Mediastinum/Nodes: No pathologic adenopathy identified. Lungs/Pleura: Index right middle lobe nodule measures 6 by 4 by 6 mm on image 97 series 4, previously 6 by 4 by 6 mm by my measurements, stable. A 3 by 3 mm left upper lobe nodule on image 44 series 4 previously measured 2 by 3 mm. Other scattered tiny pulmonary nodules are stable. Musculoskeletal: Unremarkable CT ABDOMEN PELVIS FINDINGS Hepatobiliary: Prior cholecystectomy. Mild to moderate intrahepatic biliary dilatation. Common bile duct 8 mm in diameter. Overall appearance similar to prior. No well-defined focal hepatic metastatic  lesion. Pancreas: Pancreatic duct dilatation, measuring 3.5 mm in the pancreatic body and 5 mm in diameter in the pancreatic head near the ampulla. This is similar to prior exams such as 05/18/2021. No focal pancreatic lesion identified. Spleen: Unremarkable Adrenals/Urinary Tract: Bilateral renal cysts are again observed. The adrenal glands appear normal. Stomach/Bowel: Prior APR with left colostomy. Normal appendix. No dilated bowel. As on the prior exam there is some prominence of fatty tissues along the stoma suggesting peristomal herniation of omental adipose tissue but no herniation of bowel pelvic floor laxity along the APR with bulging of tissues including loops of small bowel on image 113 series 6. Vascular/Lymphatic: Atherosclerosis is present, including aortoiliac atherosclerotic disease. No pathologic adenopathy observed. Reproductive: Uterus absent.  Pelvic floor laxity. Other: No supplemental non-categorized findings. Musculoskeletal: Lumbar spondylosis and degenerative disc disease. IMPRESSION: 1. Most of the small pulmonary nodules are stable compared to the prior exam, including the index 6 by 4 by 6 mm right middle lobe nodule. A 3 by 3 mm left upper lobe nodule previously measured 2 by 3 mm and may merit attention on surveillance imaging, although this slight difference in size measurement may be due to slice selection. 2. Stable appearance mild to moderate intrahepatic biliary dilatation and mild extrahepatic biliary dilatation along with moderate dorsal pancreatic duct dilatation. Some of the biliary prominence may be attributable to prior cholecystectomy. 3. Prior APR with left colostomy. Peristomal hernia contains omental adipose tissue but no bowel. 4. Other imaging findings of potential clinical significance: Aortic Atherosclerosis (ICD10-I70.0). Coronary atherosclerosis. Stable prominence the main pulmonary artery suggesting pulmonary arterial hypertension. Bilateral renal cysts. Pelvic  floor laxity with some bulging of small bowel loops along the pelvic floor. Lumbar spondylosis and degenerative disc disease. Electronically Signed   By: WVan ClinesM.D.   On: 11/01/2021 15:27     ASSESSMENT & PLAN:  1. [place]astatic [place] ([facility]   2. [place]al cancer ([place]   3. Encounter for antineoplastic chemotherapy   4. History of pulmonary embolism   5. Chemotherapy induced diarrhea    Cancer Staging  Rectal cancer ([facility] Staging form: Colon and Rectum, AJCC 8th Edition - Clinical stage from 01/23/2019: Stage IIIC (cT4b, cN1a, cM0) - Signed by YEarlie Server MD on  01/23/2019 - Pathologic stage from 10/06/2019: Stage IIC (ypT4b, pN0, cM0) - Signed by Earlie Server, MD on 10/06/2019 - Pathologic: Stage Unknown (rpTX, pNX, cM1) - Signed by Earlie Server, MD on 01/31/2021  #History of stage IIIC Rectal cancer, s/p TNT, followed by 09/17/19 APR/posterior vaginectomy/TAH/BSO/VY-flap, pT4b pN0 with close vaginal margin 0.2 mm.  Uterus and ovaries negative for malignancy. palliative radiation to vaginal recurrence.  Finished 09/04/2020 01/19/21 recurrence with lung metastasis.-Palliative -FOLFIRI plus bevacizumab.  Irinotecan was dropped in November 2022 due to side effects. Negative for UGT1A1*28  CEA continues to progressively increase to 599, indicating possible disease resistance.  Most recent CT showed stable disease. Irinotecan has been added back to chemotherapy regimen. Labs reviewed and discussed with patient. Proceed with dose reduced FOLFIRI.-Irinotecan 100 mg/m2 I had a lengthy discussion with the patient and daughter regarding the timing of CEA.  Currently CEA has been checked every 2 weeks with each cycles. Short-term image follow-up.   Chemotherapy-induced diarrhea, discussed with patient that her symptoms most probably will get worse with the resuming of Irinotecan.  Recommend Imodium as needed per instruction.  # chemotherapy induced neutropenia , patient will receive long-acting GCSF  prophylaxis.   #Hypokalemia, potassium is stable.  Continue potassium chloride 20 mEq twice daily.  #History of pulmonary embolism, history of bilateral DVT Continue Eliquis 2.5 mg twice daily for anticoagulation prophylaxis.   Follow-up 2 weeks for next cycle of treatment.  Earlie Server, MD, PhD

## 2021-12-12 NOTE — Progress Notes (Signed)
Pt here for follow up. No new concerns voiced.   

## 2021-12-12 NOTE — Progress Notes (Signed)
Per Dr. Tasia Catchings, ok to use urine protein results from 11/28/21. Proceed with treatment.  25 mg Benadryl given IV post Irinotecan infusion for some mild hives on the chest. Patient because very sleepy after the Benadryl and was discharged via wheelchair to her daughter's car.

## 2021-12-13 LAB — CEA: CEA: 629 ng/mL — ABNORMAL HIGH (ref 0.0–4.7)

## 2021-12-14 ENCOUNTER — Other Ambulatory Visit: Payer: Self-pay

## 2021-12-14 ENCOUNTER — Inpatient Hospital Stay: Payer: Medicare Other | Attending: Oncology

## 2021-12-14 VITALS — BP 127/53 | HR 61 | Temp 97.8°F | Resp 16

## 2021-12-14 DIAGNOSIS — Z5111 Encounter for antineoplastic chemotherapy: Secondary | ICD-10-CM | POA: Diagnosis not present

## 2021-12-14 DIAGNOSIS — R609 Edema, unspecified: Secondary | ICD-10-CM | POA: Diagnosis not present

## 2021-12-14 DIAGNOSIS — I272 Pulmonary hypertension, unspecified: Secondary | ICD-10-CM | POA: Insufficient documentation

## 2021-12-14 DIAGNOSIS — Z7901 Long term (current) use of anticoagulants: Secondary | ICD-10-CM | POA: Diagnosis not present

## 2021-12-14 DIAGNOSIS — E7800 Pure hypercholesterolemia, unspecified: Secondary | ICD-10-CM | POA: Diagnosis not present

## 2021-12-14 DIAGNOSIS — I2510 Atherosclerotic heart disease of native coronary artery without angina pectoris: Secondary | ICD-10-CM | POA: Insufficient documentation

## 2021-12-14 DIAGNOSIS — Z86718 Personal history of other venous thrombosis and embolism: Secondary | ICD-10-CM | POA: Diagnosis not present

## 2021-12-14 DIAGNOSIS — Z79899 Other long term (current) drug therapy: Secondary | ICD-10-CM | POA: Insufficient documentation

## 2021-12-14 DIAGNOSIS — C799 Secondary malignant neoplasm of unspecified site: Secondary | ICD-10-CM

## 2021-12-14 DIAGNOSIS — R197 Diarrhea, unspecified: Secondary | ICD-10-CM | POA: Insufficient documentation

## 2021-12-14 DIAGNOSIS — M5136 Other intervertebral disc degeneration, lumbar region: Secondary | ICD-10-CM | POA: Diagnosis not present

## 2021-12-14 DIAGNOSIS — Z86711 Personal history of pulmonary embolism: Secondary | ICD-10-CM | POA: Diagnosis not present

## 2021-12-14 DIAGNOSIS — Z9221 Personal history of antineoplastic chemotherapy: Secondary | ICD-10-CM | POA: Diagnosis not present

## 2021-12-14 DIAGNOSIS — K219 Gastro-esophageal reflux disease without esophagitis: Secondary | ICD-10-CM | POA: Insufficient documentation

## 2021-12-14 DIAGNOSIS — Z5189 Encounter for other specified aftercare: Secondary | ICD-10-CM | POA: Diagnosis not present

## 2021-12-14 DIAGNOSIS — E876 Hypokalemia: Secondary | ICD-10-CM | POA: Diagnosis not present

## 2021-12-14 DIAGNOSIS — C20 Malignant neoplasm of rectum: Secondary | ICD-10-CM

## 2021-12-14 DIAGNOSIS — Z800 Family history of malignant neoplasm of digestive organs: Secondary | ICD-10-CM | POA: Insufficient documentation

## 2021-12-14 DIAGNOSIS — I10 Essential (primary) hypertension: Secondary | ICD-10-CM | POA: Diagnosis not present

## 2021-12-14 DIAGNOSIS — R5383 Other fatigue: Secondary | ICD-10-CM | POA: Diagnosis not present

## 2021-12-14 MED ORDER — HEPARIN SOD (PORK) LOCK FLUSH 100 UNIT/ML IV SOLN
500.0000 [IU] | Freq: Once | INTRAVENOUS | Status: AC | PRN
  Filled 2021-12-14: qty 5

## 2021-12-14 MED ORDER — SODIUM CHLORIDE 0.9% FLUSH
10.0000 mL | INTRAVENOUS | Status: DC | PRN
  Administered 2021-12-14: 10 mL
  Filled 2021-12-14: qty 10

## 2021-12-14 MED ORDER — PEGFILGRASTIM-BMEZ 6 MG/0.6ML ~~LOC~~ SOSY
6.0000 mg | PREFILLED_SYRINGE | Freq: Once | SUBCUTANEOUS | Status: AC
  Administered 2021-12-14: 6 mg via SUBCUTANEOUS
  Filled 2021-12-14: qty 0.6

## 2021-12-14 MED ORDER — HEPARIN SOD (PORK) LOCK FLUSH 100 UNIT/ML IV SOLN
INTRAVENOUS | Status: AC
  Administered 2021-12-14: 500 [IU]
  Filled 2021-12-14: qty 5

## 2021-12-20 ENCOUNTER — Other Ambulatory Visit: Payer: Self-pay | Admitting: *Deleted

## 2021-12-20 DIAGNOSIS — E876 Hypokalemia: Secondary | ICD-10-CM

## 2021-12-20 MED ORDER — POTASSIUM CHLORIDE CRYS ER 20 MEQ PO TBCR: 40.0000 meq | EXTENDED_RELEASE_TABLET | Freq: Two times a day (BID) | ORAL | 1 refills | Status: DC

## 2021-12-20 NOTE — Telephone Encounter (Signed)
Component Ref Range & Units 8 d ago ?(12/12/21) 3 wk ago ?(11/28/21) 1 mo ago ?(11/09/21) 1 mo ago ?(10/24/21) 2 mo ago ?(10/11/21) 2 mo ago ?(09/27/21) 3 mo ago ?(09/13/21)  ?Potassium 3.5 - 5.1 mmol/L 4.0  4.1  4.1  4.0  4.0  4.0  3.9   ?  ? ?

## 2021-12-23 ENCOUNTER — Telehealth: Payer: Self-pay

## 2021-12-23 ENCOUNTER — Other Ambulatory Visit: Payer: Self-pay | Admitting: Oncology

## 2021-12-23 ENCOUNTER — Encounter: Payer: Self-pay | Admitting: Oncology

## 2021-12-23 MED ORDER — OCTREOTIDE ACETATE 100 MCG/ML IJ SOLN: 100.0000 ug | Freq: Three times a day (TID) | INTRAMUSCULAR | 0 refills | Status: DC

## 2021-12-23 NOTE — Addendum Note (Signed)
Addended by: Earlie Server on: 12/23/2021 10:58 PM   Modules accepted: Orders

## 2021-12-23 NOTE — Telephone Encounter (Signed)
Contacted [facility] to cancel octreotide rx, per MD request. (Sent in error) ?

## 2021-12-26 ENCOUNTER — Inpatient Hospital Stay: Payer: Medicare Other

## 2021-12-26 ENCOUNTER — Ambulatory Visit: Payer: Medicare Other | Admitting: Family Medicine

## 2021-12-26 ENCOUNTER — Inpatient Hospital Stay (HOSPITAL_BASED_OUTPATIENT_CLINIC_OR_DEPARTMENT_OTHER): Payer: Medicare Other | Admitting: Oncology

## 2021-12-26 ENCOUNTER — Encounter: Payer: Self-pay | Admitting: Oncology

## 2021-12-26 ENCOUNTER — Other Ambulatory Visit: Payer: Self-pay

## 2021-12-26 ENCOUNTER — Inpatient Hospital Stay (HOSPITAL_BASED_OUTPATIENT_CLINIC_OR_DEPARTMENT_OTHER): Payer: Medicare Other | Admitting: Hospice and Palliative Medicine

## 2021-12-26 VITALS — BP 148/73 | HR 89 | Temp 98.3°F | Wt 159.0 lb

## 2021-12-26 DIAGNOSIS — C799 Secondary malignant neoplasm of unspecified site: Secondary | ICD-10-CM

## 2021-12-26 DIAGNOSIS — I272 Pulmonary hypertension, unspecified: Secondary | ICD-10-CM | POA: Diagnosis not present

## 2021-12-26 DIAGNOSIS — R609 Edema, unspecified: Secondary | ICD-10-CM | POA: Diagnosis not present

## 2021-12-26 DIAGNOSIS — E7800 Pure hypercholesterolemia, unspecified: Secondary | ICD-10-CM | POA: Diagnosis not present

## 2021-12-26 DIAGNOSIS — R5383 Other fatigue: Secondary | ICD-10-CM | POA: Diagnosis not present

## 2021-12-26 DIAGNOSIS — K219 Gastro-esophageal reflux disease without esophagitis: Secondary | ICD-10-CM | POA: Diagnosis not present

## 2021-12-26 DIAGNOSIS — Z5111 Encounter for antineoplastic chemotherapy: Secondary | ICD-10-CM | POA: Diagnosis not present

## 2021-12-26 DIAGNOSIS — Z79899 Other long term (current) drug therapy: Secondary | ICD-10-CM | POA: Diagnosis not present

## 2021-12-26 DIAGNOSIS — E876 Hypokalemia: Secondary | ICD-10-CM | POA: Diagnosis not present

## 2021-12-26 DIAGNOSIS — Z9221 Personal history of antineoplastic chemotherapy: Secondary | ICD-10-CM | POA: Diagnosis not present

## 2021-12-26 DIAGNOSIS — Z86711 Personal history of pulmonary embolism: Secondary | ICD-10-CM | POA: Diagnosis not present

## 2021-12-26 DIAGNOSIS — Z800 Family history of malignant neoplasm of digestive organs: Secondary | ICD-10-CM | POA: Diagnosis not present

## 2021-12-26 DIAGNOSIS — R197 Diarrhea, unspecified: Secondary | ICD-10-CM | POA: Diagnosis not present

## 2021-12-26 DIAGNOSIS — Z86718 Personal history of other venous thrombosis and embolism: Secondary | ICD-10-CM | POA: Diagnosis not present

## 2021-12-26 DIAGNOSIS — M5136 Other intervertebral disc degeneration, lumbar region: Secondary | ICD-10-CM | POA: Diagnosis not present

## 2021-12-26 DIAGNOSIS — Z5189 Encounter for other specified aftercare: Secondary | ICD-10-CM | POA: Diagnosis not present

## 2021-12-26 DIAGNOSIS — C20 Malignant neoplasm of rectum: Secondary | ICD-10-CM | POA: Diagnosis not present

## 2021-12-26 DIAGNOSIS — Z7901 Long term (current) use of anticoagulants: Secondary | ICD-10-CM | POA: Diagnosis not present

## 2021-12-26 DIAGNOSIS — I10 Essential (primary) hypertension: Secondary | ICD-10-CM | POA: Diagnosis not present

## 2021-12-26 DIAGNOSIS — I2510 Atherosclerotic heart disease of native coronary artery without angina pectoris: Secondary | ICD-10-CM | POA: Diagnosis not present

## 2021-12-26 LAB — CBC WITH DIFFERENTIAL/PLATELET
Abs Immature Granulocytes: 0.06 10*3/uL (ref 0.00–0.07)
Basophils Absolute: 0 10*3/uL (ref 0.0–0.1)
Basophils Relative: 1 %
Eosinophils Absolute: 0.1 10*3/uL (ref 0.0–0.5)
Eosinophils Relative: 2 %
HCT: 37.8 % (ref 36.0–46.0)
Hemoglobin: 12 g/dL (ref 12.0–15.0)
Immature Granulocytes: 1 %
Lymphocytes Relative: 24 %
Lymphs Abs: 1.5 10*3/uL (ref 0.7–4.0)
MCH: 30.8 pg (ref 26.0–34.0)
MCHC: 31.7 g/dL (ref 30.0–36.0)
MCV: 96.9 fL (ref 80.0–100.0)
Monocytes Absolute: 0.4 10*3/uL (ref 0.1–1.0)
Monocytes Relative: 7 %
Neutro Abs: 3.9 10*3/uL (ref 1.7–7.7)
Neutrophils Relative %: 65 %
Platelets: 203 10*3/uL (ref 150–400)
RBC: 3.9 MIL/uL (ref 3.87–5.11)
RDW: 17.8 % — ABNORMAL HIGH (ref 11.5–15.5)
WBC: 6 10*3/uL (ref 4.0–10.5)
nRBC: 0 % (ref 0.0–0.2)

## 2021-12-26 LAB — COMPREHENSIVE METABOLIC PANEL
ALT: 21 U/L (ref 0–44)
AST: 28 U/L (ref 15–41)
Albumin: 3.9 g/dL (ref 3.5–5.0)
Alkaline Phosphatase: 82 U/L (ref 38–126)
Anion gap: 5 (ref 5–15)
BUN: 18 mg/dL (ref 8–23)
CO2: 28 mmol/L (ref 22–32)
Calcium: 9.1 mg/dL (ref 8.9–10.3)
Chloride: 101 mmol/L (ref 98–111)
Creatinine, Ser: 0.83 mg/dL (ref 0.44–1.00)
GFR, Estimated: >60 mL/min (ref >60)
Glucose, Bld: 96 mg/dL (ref 70–99)
Potassium: 4.1 mmol/L (ref 3.5–5.1)
Sodium: 134 mmol/L — ABNORMAL LOW (ref 135–145)
Total Bilirubin: 0.7 mg/dL (ref 0.3–1.2)
Total Protein: 6.8 g/dL (ref 6.5–8.1)

## 2021-12-26 LAB — PROTEIN, URINE, RANDOM: Total Protein, Urine: 14 mg/dL

## 2021-12-26 MED ORDER — SODIUM CHLORIDE 0.9 % IV SOLN
350.0000 mg | Freq: Once | INTRAVENOUS | Status: AC
  Administered 2021-12-26: 350 mg via INTRAVENOUS
  Filled 2021-12-26: qty 14

## 2021-12-26 MED ORDER — SODIUM CHLORIDE 0.9 % IV SOLN
10.0000 mg | Freq: Once | INTRAVENOUS | Status: AC
  Administered 2021-12-26: 10 mg via INTRAVENOUS
  Filled 2021-12-26: qty 10

## 2021-12-26 MED ORDER — SODIUM CHLORIDE 0.9 % IV SOLN
Freq: Once | INTRAVENOUS | Status: AC
  Filled 2021-12-26: qty 250

## 2021-12-26 MED ORDER — SODIUM CHLORIDE 0.9 % IV SOLN
100.0000 mg/m2 | Freq: Once | INTRAVENOUS | Status: AC
  Administered 2021-12-26: 180 mg via INTRAVENOUS
  Filled 2021-12-26: qty 4

## 2021-12-26 MED ORDER — PALONOSETRON HCL INJECTION 0.25 MG/5ML
0.2500 mg | Freq: Once | INTRAVENOUS | Status: AC
  Administered 2021-12-26: 0.25 mg via INTRAVENOUS
  Filled 2021-12-26: qty 5

## 2021-12-26 MED ORDER — DIPHENHYDRAMINE HCL 50 MG/ML IJ SOLN
25.0000 mg | Freq: Every day | INTRAMUSCULAR | Status: DC | PRN
  Administered 2021-12-26: 25 mg via INTRAVENOUS
  Filled 2021-12-26: qty 1

## 2021-12-26 MED ORDER — SODIUM CHLORIDE 0.9 % IV SOLN
750.0000 mg | Freq: Once | INTRAVENOUS | Status: AC
  Administered 2021-12-26: 750 mg via INTRAVENOUS
  Filled 2021-12-26: qty 37.5

## 2021-12-26 MED ORDER — ATROPINE SULFATE 1 MG/ML IV SOLN
0.4000 mg | Freq: Once | INTRAVENOUS | Status: AC
  Administered 2021-12-26: 0.4 mg via INTRAVENOUS
  Filled 2021-12-26: qty 1

## 2021-12-26 MED ORDER — SODIUM CHLORIDE 0.9 % IV SOLN
2400.0000 mg/m2 | INTRAVENOUS | Status: DC
  Administered 2021-12-26: 4300 mg via INTRAVENOUS
  Filled 2021-12-26: qty 86

## 2021-12-26 MED ORDER — SODIUM CHLORIDE 0.9% FLUSH
10.0000 mL | INTRAVENOUS | Status: DC | PRN
  Administered 2021-12-26: 10 mL
  Filled 2021-12-26: qty 10

## 2021-12-26 NOTE — Addendum Note (Signed)
Addended by: Adelina Mings on: 12/26/2021 02:02 PM ? ? Modules accepted: Orders ? ?

## 2021-12-26 NOTE — Progress Notes (Signed)
Hematology/Oncology progress note [phone]    Patient Care Team: Olin Hauser, DO as PCP - General (Family Medicine) Clent Jacks, RN as Registered Nurse Earlie Server, MD as Consulting Physician (Hematology and Oncology)  CHIEF COMPLAINTS/REASON FOR VISIT:  Follow up for rectal cancer  HISTORY OF PRESENTING ILLNESS:  Patient initially presented with complaints of postmenopausal bleeding on 08/16/2018.  History of was menopausal vaginal bleeding in 2016 which resulted in cervical polypectomy.  Pathology 02/04/2015 showed cervical polyp, consistent with benign endometrial polyp.  [name] after polypectomy due to anxiety associated with pelvic exams. pelvic exam on 08/16/2018 reviewed cervical abnormality and from enlarged uterus. Immunotherapy for nearly mismatch repair protein (MMR ) was performed.  There is no loss of MMR expression.  low probability of MSI high. # Seen by [name] for evaluation of resectability for rectal cancer. In addition, she also had a second opinion with [facility] where her endometrial biopsy pathology was changed to  adenocarcinoma, consistent with colorectal primary.   [name]. She has home health that has been assisting with ostomy care Patient was also evaluated by [facility] oncology.  Recommendation is to proceed with TNT with concurrent chemoradiation followed by neoadjuvant chemotherapy followed by surgical resection. Patient prefers to have treatment done locally with [facility] # Oncology Treatment:  02/03/2019- 03/19/2019  concurrent Xeloda and radiation.  Xeloda dose 8103m /m2 BID - rounded to 16544mBID- on days of radiation. 04/09/2019, started on FOLFOX with bolus early.  Omitted.  07/16/2019 finished 8 cycles of FOLFOX. 09/17/19 APR/posterior vaginectomy/TAH/BSO/VY-flap pT4b pN0 with close vaginal margin 0.2 mm.  Uterus and ovaries negative for malignancy. Patient reports bilateral lower  extremity numbness and tingling, intermittent, left worse than right. She has lost a lot of weight since her APR surgery.  #Family history with half brother having's history of colon cancer prostate cancer.  Personal history of colorectal cancer.  Patient has not decided if she wants genetic testing.   # history of PE( 01/13/2020)  in the bilateral lower extremity DVT (01/13/2020).   She finishes 6 months of anticoagulation with Eliquis 5 mg twice daily. Now switched to Eliquis 2.5 mg twice daily.. # She has now developed recurrent disease. #06/30/20  vaginal introitus mass biopsied. Pathology is consistent with metastatic colorectal adenocarcinoma I have discussed with [name]  Dr. MaHester Matesnd the mass is not resectable. Patient has also had colonoscopy by Dr. AnVicente Malesesterday. Normal examination. # 07/16/2020 cycle 1 FOLFIRI  # 07/20/2020 PET scan was done for further evaluation, images are consistent with local recurrence, no distant metastasis. #Discussed with radiation oncology Dr. ChBaruch Goutyill recommends concurrent chemotherapy and radiation. 08/02/2020-08/16/2020, patient starts radiation.  Xeloda was held due to neutropenia 08/17/2020,-09/06/2020 Xeloda 1500 mg twice daily concurrently with radiation 01/31/21 started on FOLFIRI + Bev 05/18/2021 CT chest abdomen pelvis showed Previously noted enlargement of bilateral inguinal lymph nodes is resolved, consistent with treatment response of nodal metastatic disease. Interval decrease in size of multiple small bilateral pulmonary nodules, consistent with treatment response of pulmonary metastatic disease. No evidence of new metastatic disease. 05/24/2021 - 08/30/2021, continued on FOLFIRI plus bevacizumab.  Irinotecan dose was reduced, eventually 10073m2 09/02/2021, CT chest abdomen pelvis without contrast Showed small bilateral pulmonary nodules, unchanged.  Stable metastatic disease.  No noncontrast evidence of new metastatic disease in the chest  abdomen pelvis.  Small parastomal hernia.  Enlargement of main pulmonary artery.  Coronary artery disease. 09/13/2021, maintenance 5-FU/bevacizumab 11/28/2021, 5-FU/Irinotecan/bevacizumab.  Irinotecan  100 mg/m2 was added back due to progressively increasing CEA.  INTERVAL HISTORY Here for next cycle of chemotherapy.  On reduced dose FOLFIRI/bevacizumab.  Continues to have chemo induced diarrhea.  Uses antidiarrheal.  No new concerns.   Review of Systems  Constitutional:  Positive for fatigue. Negative for appetite change, chills, fever and unexpected weight change.  HENT:   Negative for hearing loss and voice change.   Eyes:  Negative for eye problems.  Respiratory:  Negative for chest tightness and cough.   Cardiovascular:  Negative for chest pain.  Gastrointestinal:  Positive for diarrhea. Negative for abdominal distention, abdominal pain, blood in stool, constipation and nausea.  Endocrine: Negative for hot flashes.  Genitourinary:  Negative for difficulty urinating and frequency.   Musculoskeletal:  Negative for arthralgias.  Skin:  Negative for itching and rash.  Neurological:  Negative for extremity weakness and numbness.  Hematological:  Negative for adenopathy.  Psychiatric/Behavioral:  Negative for confusion.    MEDICAL HISTORY:  Past Medical History:  Diagnosis Date   Allergy    Arthritis    Blood clot in vein    Family history of colon cancer    GERD (gastroesophageal reflux disease)    Hypercholesteremia    Hypertension    Hypertension    Lower extremity edema    Rectal cancer ([place]) 12/2018   Urinary incontinence     SURGICAL HISTORY: Past Surgical History:  Procedure Laterality Date   ABDOMINAL HYSTERECTOMY     CHOLECYSTECTOMY  1971   COLONOSCOPY WITH PROPOFOL N/A 12/03/2018   Procedure: COLONOSCOPY WITH PROPOFOL;  Surgeon: Lucilla Lame, MD;  Location: [facility] ENDOSCOPY;  Service: Endoscopy;  Laterality: N/A;   COLONOSCOPY WITH PROPOFOL N/A 07/15/2020    Procedure: COLONOSCOPY WITH PROPOFOL;  Surgeon: [name], MD;  Location: [facility] ENDOSCOPY;  Service: Gastroenterology;  Laterality: N/A;   FLEXIBLE SIGMOIDOSCOPY N/A 12/06/2018   Procedure: FLEXIBLE SIGMOIDOSCOPY;  Surgeon: [name], MD;  Location: [facility] ENDOSCOPY;  Service: Endoscopy;  Laterality: N/A;   LAPAROSCOPIC COLOSTOMY  01/06/2019   PORTACATH PLACEMENT N/A 04/03/2019   Procedure: INSERTION PORT-A-CATH;  Surgeon: Jules Husbands, MD;  Location: [facility] ORS;  Service: General;  Laterality: N/A;    SOCIAL HISTORY: Social History   Socioeconomic History   Marital status: Widowed    Spouse name: Not on file   Number of children: Not on file   Years of education: Not on file   Highest education level: Not on file  Occupational History   Not on file  Tobacco Use   Smoking status: Former    Types: Cigarettes    Quit date: 12/02/1977    Years since quitting: 44.0   Smokeless tobacco: Former  Scientific laboratory technician Use: Never used  Substance and Sexual Activity   Alcohol use: Never   Drug use: Never   Sexual activity: Not Currently    Birth control/protection: None  Other Topics Concern   Not on file  Social History Narrative   Lives with daughter   Social Determinants of Health   Financial Resource Strain: Low Risk    Difficulty of Paying Living Expenses: Not hard at all  Food Insecurity: No Food Insecurity   Worried About Charity fundraiser in the Last Year: Never true   [place] in the Last Year: Never true  Transportation Needs: No Transportation Needs   Lack of Transportation (Medical): No   Lack of Transportation (Non-Medical): No  Physical Activity: Inactive   Days of  Exercise per Week: 0 days   Minutes of Exercise per Session: 0 min  Stress: No Stress Concern Present   Feeling of Stress : Not at all  Social Connections: Not on file  Intimate Partner Violence: Not on file    FAMILY HISTORY: Family History  Problem Relation Age of Onset   Colon cancer  Brother 46       exposure to chemicals [place]   Hypertension Mother    Stroke Mother    Kidney failure Father    Breast cancer Neg Hx    Ovarian cancer Neg Hx     ALLERGIES:  is allergic to sulfamethoxazole-trimethoprim.  MEDICATIONS:  Current Outpatient Medications  Medication Sig Dispense Refill   Cholecalciferol (VITAMIN D3) 2000 units capsule Take 2,000 Units by mouth daily.     diclofenac sodium (VOLTAREN) 1 % GEL Apply 2 g topically 4 (four) times daily as needed (joint pain).  11   diphenoxylate-atropine (LOMOTIL) 2.5-0.025 MG tablet Take 1 tablet by mouth 4 (four) times daily as needed for diarrhea or loose stools. (Patient not taking: Reported on 09/27/2021) 30 tablet 1   ELIQUIS 2.5 MG TABS tablet Take 1 tablet by mouth twice daily 60 tablet 2   fluticasone (FLONASE) 50 MCG/ACT nasal spray USE 1 SPRAY IN EACH NOSTRIL ONCE DAILY 16 g 3   gabapentin (NEURONTIN) 100 MG capsule Take 1 capsule (100 mg total) by mouth at bedtime. 30 capsule 0   ipratropium (ATROVENT) 0.03 % nasal spray Place 1 spray into both nostrils 2 (two) times daily. 30 mL 2   lidocaine-prilocaine (EMLA) cream Apply 1 application topically as needed. 30 g 6   loperamide (IMODIUM) 2 MG capsule Take 1 capsule (2 mg total) by mouth See admin instructions. With onset of loose stool, take 22m followed by 233mevery 2 hours,  Maximum: 16 mg/day (Patient not taking: Reported on 09/27/2021) 120 capsule 1   loratadine (CLARITIN) 10 MG tablet Take 10 mg by mouth daily.     Multiple Vitamins-Minerals (ONE-A-DAY WOMENS 50 PLUS PO) Take 1 tablet by mouth daily.      ondansetron (ZOFRAN) 8 MG tablet Take 1 tablet (8 mg total) by mouth 2 (two) times daily as needed for refractory nausea / vomiting. Start on day 3 after chemotherapy. 30 tablet 1   potassium chloride SA (KLOR-CON M) 20 MEQ tablet Take 2 tablets (40 mEq total) by mouth 2 (two) times daily. 120 tablet 1   prochlorperazine (COMPAZINE) 10 MG tablet Take 1 tablet (10  mg total) by mouth every 6 (six) hours as needed (NAUSEA). 30 tablet 1   simvastatin (ZOCOR) 40 MG tablet Take 1 tablet (40 mg total) by mouth at bedtime. 90 tablet 3   triamterene-hydrochlorothiazide (DYAZIDE) 37.5-25 MG capsule Take 1 each (1 capsule total) by mouth daily. 90 capsule 3   zinc gluconate 50 MG tablet Take 50 mg by mouth daily.     No current facility-administered medications for this visit.     PHYSICAL EXAMINATION: ECOG PERFORMANCE STATUS: 1 - Symptomatic but completely ambulatory  Physical Exam Constitutional:      Appearance: Normal appearance.  HENT:     Head: Normocephalic and atraumatic.  Eyes:     Pupils: Pupils are equal, round, and reactive to light.  Cardiovascular:     Rate and Rhythm: Normal rate and regular rhythm.     Heart sounds: Normal heart sounds. No murmur heard. Pulmonary:     Effort: Pulmonary effort is normal.  Breath sounds: Normal breath sounds. No wheezing.  Abdominal:     General: Bowel sounds are normal. There is no distension.     Palpations: Abdomen is soft.     Tenderness: There is no abdominal tenderness.     Comments: Colostomy in place  Musculoskeletal:        General: Normal range of motion.     Cervical back: Normal range of motion.  Skin:    General: Skin is warm and dry.     Findings: No rash.  Neurological:     Mental Status: She is alert and oriented to person, place, and time.  Psychiatric:        Judgment: Judgment normal.      LABORATORY DATA:  I have reviewed the data as listed Lab Results  Component Value Date   WBC 5.2 12/12/2021   HGB 11.9 (L) 12/12/2021   HCT 37.8 12/12/2021   MCV 97.4 12/12/2021   PLT 209 12/12/2021   Recent Labs    11/09/21 0800 11/28/21 0843 12/12/21 0841  NA 139 136 136  K 4.1 4.1 4.0  CL 103 102 101  CO2 _0 GLUCOSE 109* 96 96  BUN _1 CREATININE 0.81 0.80 0.82  CALCIUM 9.6 9.4 9.1  GFRNONAA >60 >60 >60  PROT 6.6 6.8 6.8  ALBUMIN 4.1 4.0 4.0  AST  _2 ALT _3 ALKPHOS 62 58 60  BILITOT 0.3 0.2* <0.1*    Iron/TIBC/Ferritin/ %Sat    Component Value Date/Time   IRON 40 06/23/2020 1124   TIBC 246 (L) 06/23/2020 1124   FERRITIN 338 (H) 06/23/2020 1124   IRONPCTSAT 16 06/23/2020 1124     RADIOGRAPHIC STUDIES: I have personally reviewed the radiological images as listed and agreed with the findings in the report. CT CHEST ABDOMEN PELVIS W CONTRAST  Result Date: 11/01/2021 CLINICAL DATA:  Restaging rectal cancer, elevated CEA level. Prior APR. EXAM: CT CHEST, ABDOMEN, AND PELVIS WITH CONTRAST TECHNIQUE: Multidetector CT imaging of the chest, abdomen and pelvis was performed following the standard protocol during bolus administration of intravenous contrast. RADIATION DOSE REDUCTION: This exam was performed according to the departmental dose-optimization program which includes automated exposure control, adjustment of the mA and/or kV according to patient size and/or use of iterative reconstruction technique. CONTRAST:  133m OMNIPAQUE IOHEXOL 300 MG/ML  SOLN COMPARISON:  Multiple exams, including 09/01/2021 FINDINGS: CT CHEST FINDINGS Cardiovascular: Right Port-A-Cath tip: Right atrium. Coronary, aortic arch, and branch vessel atherosclerotic vascular disease. Stable prominence of the main pulmonary artery. Mediastinum/Nodes: No pathologic adenopathy identified. Lungs/Pleura: Index right middle lobe nodule measures 6 by 4 by 6 mm on image 97 series 4, previously 6 by 4 by 6 mm by my measurements, stable. A 3 by 3 mm left upper lobe nodule on image 44 series 4 previously measured 2 by 3 mm. Other scattered tiny pulmonary nodules are stable. Musculoskeletal: Unremarkable CT ABDOMEN PELVIS FINDINGS Hepatobiliary: Prior cholecystectomy. Mild to moderate intrahepatic biliary dilatation. Common bile duct 8 mm in diameter. Overall appearance similar to prior. No well-defined focal hepatic metastatic lesion. Pancreas: Pancreatic duct dilatation,  measuring 3.5 mm in the pancreatic body and 5 mm in diameter in the pancreatic head near the ampulla. This is similar to prior exams such as 05/18/2021. No focal pancreatic lesion identified. Spleen: Unremarkable Adrenals/Urinary Tract: Bilateral renal cysts are again observed. The adrenal glands appear normal. Stomach/Bowel: Prior APR with left colostomy. Normal appendix. No dilated bowel.  As on the prior exam there is some prominence of fatty tissues along the stoma suggesting peristomal herniation of omental adipose tissue but no herniation of bowel pelvic floor laxity along the APR with bulging of tissues including loops of small bowel on image 113 series 6. Vascular/Lymphatic: Atherosclerosis is present, including aortoiliac atherosclerotic disease. No pathologic adenopathy observed. Reproductive: Uterus absent.  Pelvic floor laxity. Other: No supplemental non-categorized findings. Musculoskeletal: Lumbar spondylosis and degenerative disc disease. IMPRESSION: 1. Most of the small pulmonary nodules are stable compared to the prior exam, including the index 6 by 4 by 6 mm right middle lobe nodule. A 3 by 3 mm left upper lobe nodule previously measured 2 by 3 mm and may merit attention on surveillance imaging, although this slight difference in size measurement may be due to slice selection. 2. Stable appearance mild to moderate intrahepatic biliary dilatation and mild extrahepatic biliary dilatation along with moderate dorsal pancreatic duct dilatation. Some of the biliary prominence may be attributable to prior cholecystectomy. 3. Prior APR with left colostomy. Peristomal hernia contains omental adipose tissue but no bowel. 4. Other imaging findings of potential clinical significance: Aortic Atherosclerosis (ICD10-I70.0). Coronary atherosclerosis. Stable prominence the main pulmonary artery suggesting pulmonary arterial hypertension. Bilateral renal cysts. Pelvic floor laxity with some bulging of small bowel  loops along the pelvic floor. Lumbar spondylosis and degenerative disc disease. Electronically Signed   By: Van Clines M.D.   On: 11/01/2021 15:27      CT CHEST ABDOMEN PELVIS W CONTRAST  Result Date: 11/01/2021 CLINICAL DATA:  Restaging rectal cancer, elevated CEA level. Prior APR. EXAM: CT CHEST, ABDOMEN, AND PELVIS WITH CONTRAST TECHNIQUE: Multidetector CT imaging of the chest, abdomen and pelvis was performed following the standard protocol during bolus administration of intravenous contrast. RADIATION DOSE REDUCTION: This exam was performed according to the departmental dose-optimization program which includes automated exposure control, adjustment of the mA and/or kV according to patient size and/or use of iterative reconstruction technique. CONTRAST:  170m OMNIPAQUE IOHEXOL 300 MG/ML  SOLN COMPARISON:  Multiple exams, including 09/01/2021 FINDINGS: CT CHEST FINDINGS Cardiovascular: Right Port-A-Cath tip: Right atrium. Coronary, aortic arch, and branch vessel atherosclerotic vascular disease. Stable prominence of the main pulmonary artery. Mediastinum/Nodes: No pathologic adenopathy identified. Lungs/Pleura: Index right middle lobe nodule measures 6 by 4 by 6 mm on image 97 series 4, previously 6 by 4 by 6 mm by my measurements, stable. A 3 by 3 mm left upper lobe nodule on image 44 series 4 previously measured 2 by 3 mm. Other scattered tiny pulmonary nodules are stable. Musculoskeletal: Unremarkable CT ABDOMEN PELVIS FINDINGS Hepatobiliary: Prior cholecystectomy. Mild to moderate intrahepatic biliary dilatation. Common bile duct 8 mm in diameter. Overall appearance similar to prior. No well-defined focal hepatic metastatic lesion. Pancreas: Pancreatic duct dilatation, measuring 3.5 mm in the pancreatic body and 5 mm in diameter in the pancreatic head near the ampulla. This is similar to prior exams such as 05/18/2021. No focal pancreatic lesion identified. Spleen: Unremarkable Adrenals/Urinary  Tract: Bilateral renal cysts are again observed. The adrenal glands appear normal. Stomach/Bowel: Prior APR with left colostomy. Normal appendix. No dilated bowel. As on the prior exam there is some prominence of fatty tissues along the stoma suggesting peristomal herniation of omental adipose tissue but no herniation of bowel pelvic floor laxity along the APR with bulging of tissues including loops of small bowel on image 113 series 6. Vascular/Lymphatic: Atherosclerosis is present, including aortoiliac atherosclerotic disease. No pathologic adenopathy observed. Reproductive: Uterus absent.  Pelvic floor laxity. Other: No supplemental non-categorized findings. Musculoskeletal: Lumbar spondylosis and degenerative disc disease. IMPRESSION: 1. Most of the small pulmonary nodules are stable compared to the prior exam, including the index 6 by 4 by 6 mm right middle lobe nodule. A 3 by 3 mm left upper lobe nodule previously measured 2 by 3 mm and may merit attention on surveillance imaging, although this slight difference in size measurement may be due to slice selection. 2. Stable appearance mild to moderate intrahepatic biliary dilatation and mild extrahepatic biliary dilatation along with moderate dorsal pancreatic duct dilatation. Some of the biliary prominence may be attributable to prior cholecystectomy. 3. Prior APR with left colostomy. Peristomal hernia contains omental adipose tissue but no bowel. 4. Other imaging findings of potential clinical significance: Aortic Atherosclerosis (ICD10-I70.0). Coronary atherosclerosis. Stable prominence the main pulmonary artery suggesting pulmonary arterial hypertension. Bilateral renal cysts. Pelvic floor laxity with some bulging of small bowel loops along the pelvic floor. Lumbar spondylosis and degenerative disc disease. Electronically Signed   By: Van Clines M.D.   On: 11/01/2021 15:27     ASSESSMENT & PLAN:  No diagnosis found.  Cancer Staging  Rectal  cancer [facility]) Staging form: Colon and Rectum, AJCC 8th Edition - Clinical stage from 01/23/2019: Stage IIIC (cT4b, cN1a, cM0) - Signed by Earlie Server, MD on 01/23/2019 - Pathologic stage from 10/06/2019: Stage IIC (ypT4b, pN0, cM0) - Signed by Earlie Server, MD on 10/06/2019 - Pathologic: Stage Unknown (rpTX, pNX, cM1) - Signed by Earlie Server, MD on 01/31/2021  Clinically she is stable and doing well.  Here for next cycle of chemotherapy.  Labs from today show an unremarkable CBC and CMP.  Proceed with treatment.  Chemo induced diarrhea- Continue Imodium as needed.  Appears to be stable per patient and daughter.  Continue with long-acting G-CSF with each treatment due to previous chemo induced neutropenia.  White count stable today.  Hypokalemia-potassium 4.1.  Continue potassium supplements 2 tablets twice daily.  History of PE and bilateral DVT-continue Eliquis 2.5 mg twice daily.  No evidence of bleeding.  Disposition- Treatment today. RTC as scheduled in 2 weeks for follow-up with labs, MD assessment and next cycle.  I spent 25 minutes dedicated to the care of this patient (face-to-face and non-face-to-face) on the date of the encounter to include what is described in the assessment and plan.  Faythe Casa, NP 12/26/2021 10:16 AM

## 2021-12-26 NOTE — Progress Notes (Signed)
Per Faythe Casa, NP, OK to use previous urine protein for today's treatment.  ?

## 2021-12-26 NOTE — Patient Instructions (Signed)
[facility] CANCER CTR AT [place]  Discharge Instructions: ?Thank you for choosing [place] to provide your oncology and hematology care.  ?If you have a lab appointment with the [facility], please go directly to the [place] and check in at the registration area. ? ?Wear comfortable clothing and clothing appropriate for easy access to any Portacath or PICC line.  ? ?We strive to give you quality time with your provider. You may need to reschedule your appointment if you arrive late (15 or more minutes).  Arriving late affects you and other patients whose appointments are after yours.  Also, if you miss three or more appointments without notifying the office, you may be dismissed from the clinic at the provider?s discretion.    ?  ?For prescription refill requests, have your pharmacy contact our office and allow 72 hours for refills to be completed.   ? ?Today you received the following chemotherapy and/or immunotherapy agents: Bevacizumab, Irinotecan, Leucovorin, 5FU    ?  ?To help prevent nausea and vomiting after your treatment, we encourage you to take your nausea medication as directed. ? ?BELOW ARE SYMPTOMS THAT SHOULD BE REPORTED IMMEDIATELY: ?*FEVER GREATER THAN 100.4 F (38 ?C) OR HIGHER ?*CHILLS OR SWEATING ?*NAUSEA AND VOMITING THAT IS NOT CONTROLLED WITH YOUR NAUSEA MEDICATION ?*UNUSUAL SHORTNESS OF BREATH ?*UNUSUAL BRUISING OR BLEEDING ?*URINARY PROBLEMS (pain or burning when urinating, or frequent urination) ?*BOWEL PROBLEMS (unusual diarrhea, constipation, pain near the anus) ?TENDERNESS IN MOUTH AND THROAT WITH OR WITHOUT PRESENCE OF ULCERS (sore throat, sores in mouth, or a toothache) ?UNUSUAL RASH, SWELLING OR PAIN  ?UNUSUAL VAGINAL DISCHARGE OR ITCHING  ? ?Items with * indicate a potential emergency and should be followed up as soon as possible or go to the Emergency Department if any problems should occur. ? ?Please show the CHEMOTHERAPY ALERT CARD or  IMMUNOTHERAPY ALERT CARD at check-in to the Emergency Department and triage nurse. ? ?Should you have questions after your visit or need to cancel or reschedule your appointment, please contact [facility] CANCER [place] AT [place]  [phone] and follow the prompts.  Office hours are 8:00 a.m. to 4:30 p.m. Monday - Friday. Please note that voicemails left after 4:00 p.m. may not be returned until the following business day.  We are closed weekends and major holidays. You have access to a nurse at all times for urgent questions. Please call the main number to the clinic [phone] and follow the prompts. ? ?For any non-urgent questions, you may also contact your provider using MyChart. We now offer e-Visits for anyone 1 and older to request care online for non-urgent symptoms. For details visit [email or web address]. ?  ?Also download the MyChart app! Go to the app store, search "MyChart", open the app, select [place], and log in with your MyChart username and password. ? ?Due to Covid, a mask is required upon entering the hospital/clinic. If you do not have a mask, one will be given to you upon arrival. For doctor visits, patients may have 1 support person aged 54 or older with them. For treatment visits, patients cannot have anyone with them due to current Covid guidelines and our immunocompromised population.  ?

## 2021-12-26 NOTE — Progress Notes (Signed)
Did not reach patient.  She had treatment earlier today.  Will reschedule. ?

## 2021-12-27 LAB — CEA: CEA: 683 ng/mL — ABNORMAL HIGH (ref 0.0–4.7)

## 2021-12-28 ENCOUNTER — Other Ambulatory Visit: Payer: Self-pay

## 2021-12-28 ENCOUNTER — Inpatient Hospital Stay: Payer: Medicare Other

## 2021-12-28 VITALS — BP 144/69 | HR 66 | Temp 97.4°F | Resp 18

## 2021-12-28 DIAGNOSIS — R5383 Other fatigue: Secondary | ICD-10-CM | POA: Diagnosis not present

## 2021-12-28 DIAGNOSIS — I272 Pulmonary hypertension, unspecified: Secondary | ICD-10-CM | POA: Diagnosis not present

## 2021-12-28 DIAGNOSIS — Z7901 Long term (current) use of anticoagulants: Secondary | ICD-10-CM | POA: Diagnosis not present

## 2021-12-28 DIAGNOSIS — K219 Gastro-esophageal reflux disease without esophagitis: Secondary | ICD-10-CM | POA: Diagnosis not present

## 2021-12-28 DIAGNOSIS — I2510 Atherosclerotic heart disease of native coronary artery without angina pectoris: Secondary | ICD-10-CM | POA: Diagnosis not present

## 2021-12-28 DIAGNOSIS — Z79899 Other long term (current) drug therapy: Secondary | ICD-10-CM | POA: Diagnosis not present

## 2021-12-28 DIAGNOSIS — C20 Malignant neoplasm of rectum: Secondary | ICD-10-CM

## 2021-12-28 DIAGNOSIS — R609 Edema, unspecified: Secondary | ICD-10-CM | POA: Diagnosis not present

## 2021-12-28 DIAGNOSIS — Z5111 Encounter for antineoplastic chemotherapy: Secondary | ICD-10-CM | POA: Diagnosis not present

## 2021-12-28 DIAGNOSIS — Z5189 Encounter for other specified aftercare: Secondary | ICD-10-CM | POA: Diagnosis not present

## 2021-12-28 DIAGNOSIS — Z9221 Personal history of antineoplastic chemotherapy: Secondary | ICD-10-CM | POA: Diagnosis not present

## 2021-12-28 DIAGNOSIS — Z86711 Personal history of pulmonary embolism: Secondary | ICD-10-CM | POA: Diagnosis not present

## 2021-12-28 DIAGNOSIS — M5136 Other intervertebral disc degeneration, lumbar region: Secondary | ICD-10-CM | POA: Diagnosis not present

## 2021-12-28 DIAGNOSIS — Z800 Family history of malignant neoplasm of digestive organs: Secondary | ICD-10-CM | POA: Diagnosis not present

## 2021-12-28 DIAGNOSIS — E876 Hypokalemia: Secondary | ICD-10-CM | POA: Diagnosis not present

## 2021-12-28 DIAGNOSIS — I10 Essential (primary) hypertension: Secondary | ICD-10-CM | POA: Diagnosis not present

## 2021-12-28 DIAGNOSIS — Z86718 Personal history of other venous thrombosis and embolism: Secondary | ICD-10-CM | POA: Diagnosis not present

## 2021-12-28 DIAGNOSIS — Z933 Colostomy status: Secondary | ICD-10-CM | POA: Diagnosis not present

## 2021-12-28 DIAGNOSIS — R197 Diarrhea, unspecified: Secondary | ICD-10-CM | POA: Diagnosis not present

## 2021-12-28 DIAGNOSIS — E7800 Pure hypercholesterolemia, unspecified: Secondary | ICD-10-CM | POA: Diagnosis not present

## 2021-12-28 DIAGNOSIS — C799 Secondary malignant neoplasm of unspecified site: Secondary | ICD-10-CM

## 2021-12-28 MED ORDER — PEGFILGRASTIM-BMEZ 6 MG/0.6ML ~~LOC~~ SOSY
6.0000 mg | PREFILLED_SYRINGE | Freq: Once | SUBCUTANEOUS | Status: AC
  Administered 2021-12-28: 6 mg via SUBCUTANEOUS
  Filled 2021-12-28: qty 0.6

## 2021-12-28 MED ORDER — SODIUM CHLORIDE 0.9% FLUSH
10.0000 mL | INTRAVENOUS | Status: DC | PRN
  Administered 2021-12-28: 10 mL
  Filled 2021-12-28: qty 10

## 2021-12-28 MED ORDER — HEPARIN SOD (PORK) LOCK FLUSH 100 UNIT/ML IV SOLN
500.0000 [IU] | Freq: Once | INTRAVENOUS | Status: AC | PRN
  Administered 2021-12-28: 500 [IU]
  Filled 2021-12-28: qty 5

## 2022-01-02 ENCOUNTER — Ambulatory Visit (INDEPENDENT_AMBULATORY_CARE_PROVIDER_SITE_OTHER): Payer: Medicare Other | Admitting: Family Medicine

## 2022-01-02 ENCOUNTER — Other Ambulatory Visit: Payer: Self-pay

## 2022-01-02 ENCOUNTER — Encounter: Payer: Self-pay | Admitting: Family Medicine

## 2022-01-02 VITALS — BP 124/80 | HR 69 | Ht 65.0 in | Wt 160.0 lb

## 2022-01-02 DIAGNOSIS — L89311 Pressure ulcer of right buttock, stage 1: Secondary | ICD-10-CM | POA: Diagnosis not present

## 2022-01-02 DIAGNOSIS — Z933 Colostomy status: Secondary | ICD-10-CM | POA: Diagnosis not present

## 2022-01-02 DIAGNOSIS — K521 Toxic gastroenteritis and colitis: Secondary | ICD-10-CM | POA: Diagnosis not present

## 2022-01-02 DIAGNOSIS — C799 Secondary malignant neoplasm of unspecified site: Secondary | ICD-10-CM

## 2022-01-02 NOTE — Patient Instructions (Addendum)
Thank you for coming to the office today. ? ?Recommend future COVID19 booster in the Fall 2023. ? ?Glad to hear the pressure sore is resolving. Use Desitin as needed if returns. ? ?Keep up with Oncology therapy as managed. ? ?DUE for FASTING BLOOD WORK (no food or drink after midnight before the lab appointment, only water or coffee without cream/sugar on the morning of) ? ?SCHEDULE "Lab Only" visit in the morning at the clinic for lab draw in 6 MONTHS  ? ?- Make sure Lab Only appointment is at about 1 week before your next appointment, so that results will be available ? ?For Lab Results, once available within 2-3 days of blood draw, you can can log in to MyChart online to view your results and a brief explanation. Also, we can discuss results at next follow-up visit. ? ? ?Please schedule a Follow-up Appointment to: Return in about 6 months (around 07/05/2022) for 6 month fasting lab only then 1 week later Annual Physical. ? ?If you have any other questions or concerns, please feel free to call the office or send a message through [place]. You may also schedule an earlier appointment if necessary. ? ?Additionally, you may be receiving a survey about your experience at our office within a few days to 1 week by e-mail or mail. We value your feedback. ? ?Nobie Putnam, DO ?[place] ?

## 2022-01-02 NOTE — Addendum Note (Signed)
Addended by: Olin Hauser on: 01/02/2022 06:11 PM ? ? Modules accepted: Level of Service ? ?

## 2022-01-02 NOTE — Progress Notes (Signed)
? ?Subjective:  ? ? Patient ID: Jacqueline Yoder, female    DOB: [birth year], 76 y.o.   MRN: [id number] ? ?Jacqueline Yoder is a 76 y.o. female presenting on 01/02/2022 for Hypertension ? ? ?HPI ? ?Oncologist - Dr Earlie Server [facility]) ?Radiation Oncologist - Dr Noreene Filbert [facility]) ?GYN - Dani Gobble CNM ([facility]) ?  ?Metastatic Adenocarcinoma Carcinoma of Colon ?Spread to Endometrium/Uterine Cancer ?Initial diagnosis followed since 2019 with postmenopausal vaginal bleeding and spotting, and ultimately led to tissue diagnosis from endometrial biopsy. ?  ?S/p12/2/20 APR/posterior vaginectomy / Hysterectomy (total abdominal) /BSO/VY-flap, s/p chemotherapy and radiation therapy ?  ?Continues with chemo/immunotherapy infusions with good results. She goes every 2 weeks for treatment. ? ?Improved now with less diarrhea. Still possible. ?  ?History of identified some blood clots on CT scanning, with clot in lung with identified PE (12/2019) and bilateral lower extremity (12/2019), she was treated with Eliquis for anticoagulation. Transitioned to Eliquis 2.'5mg'$  BID. ?  ?  ?CHRONIC HTN: ?Hypokalemia ?Reports home readings mostly controlled  ?Current Meds - Triamterene-HCTZ 37.5-'25mg'$  daily. SHe is on Potassium Supplement 84mq daily. Last lab K 3.6 in 10/2020 ?Reports good compliance, took meds today. Tolerating well, w/o complaints. ?Denies CP, dyspnea, HA, edema, dizziness / lightheadedness ?  ?Additional topic ?  ?Chronic DVT / on Chronic Anticoagulation ?On Eliquis 2.'5mg'$  daily for anticoagulation ?  ?Gluteal Pressure Sore Stage 1 / Superficial - RESOLVED ?Advised that the pressure sore has resolved. She uses occasional Desitin for barrier support, which has resolved. ? ? ?[facility] 06/28/2021 09/13/2020  ?Decreased Interest 0 0 0  ?Down, Depressed, Hopeless 0 0 0  ?PHQ - 2 Score 0 0 0  ?Altered sleeping 0 - -  ?Tired, decreased energy 0 - -  ?Change in appetite 0 - -  ?Feeling bad or  failure about yourself  0 - -  ?Trouble concentrating 0 - -  ?Moving slowly or fidgety/restless 0 - -  ?Suicidal thoughts 0 - -  ?PHQ-9 Score 0 - -  ?Difficult doing work/chores Not difficult at all - -  ?Some recent data might be hidden  ? ? ?Social History  ? ?Tobacco Use  ? Smoking status: Former  ?  Types: Cigarettes  ?  Quit date: 12/02/1977  ?  Years since quitting: 44.1  ? Smokeless tobacco: Former  ?Vaping Use  ? Vaping Use: Never used  ?Substance Use Topics  ? Alcohol use: Never  ? Drug use: Never  ? ? ?Review of Systems ?Per HPI unless specifically indicated above ? ?   ?Objective:  ?  ?BP 124/80   Pulse 69   Ht '5\' 5"'$  (1.651 m)   Wt 160 lb (72.6 kg)   SpO2 99%   BMI 26.63 kg/m?   ?Wt Readings from Last 3 Encounters:  ?01/02/22 160 lb (72.6 kg)  ?12/26/21 159 lb (72.1 kg)  ?12/12/21 159 lb 12.8 oz (72.5 kg)  ?  ?Physical Exam ?Vitals and nursing note reviewed.  ?Constitutional:   ?   General: She is not in acute distress. ?   Appearance: Normal appearance. She is well-developed. She is not diaphoretic.  ?   Comments: Well-appearing, comfortable, cooperative  ?HENT:  ?   Head: Normocephalic and atraumatic.  ?Eyes:  ?   General:     ?   Right eye: No discharge.     ?   Left eye: No discharge.  ?   Conjunctiva/sclera: Conjunctivae normal.  ?Cardiovascular:  ?  Rate and Rhythm: Normal rate.  ?Pulmonary:  ?   Effort: Pulmonary effort is normal.  ?Skin: ?   General: Skin is warm and dry.  ?   Findings: No erythema or rash.  ?Neurological:  ?   Mental Status: She is alert and oriented to person, place, and time.  ?Psychiatric:     ?   Mood and Affect: Mood normal.     ?   Behavior: Behavior normal.     ?   Thought Content: Thought content normal.  ?   Comments: Well groomed, good eye contact, normal speech and thoughts  ? ? ? ?Results for orders placed or performed in visit on 12/26/21  ?CEA  ?Result Value Ref Range  ? CEA 683.0 (H) 0.0 - 4.7 ng/mL  ?Comprehensive metabolic panel  ?Result Value Ref Range  ?  Sodium 134 (L) 135 - 145 mmol/L  ? Potassium 4.1 3.5 - 5.1 mmol/L  ? Chloride 101 98 - 111 mmol/L  ? CO2 28 22 - 32 mmol/L  ? Glucose, Bld 96 70 - 99 mg/dL  ? BUN 18 8 - 23 mg/dL  ? Creatinine, Ser 0.83 0.44 - 1.00 mg/dL  ? Calcium 9.1 8.9 - 10.3 mg/dL  ? Total Protein 6.8 6.5 - 8.1 g/dL  ? Albumin 3.9 3.5 - 5.0 g/dL  ? AST 28 15 - 41 U/L  ? ALT 21 0 - 44 U/L  ? Alkaline Phosphatase 82 38 - 126 U/L  ? Total Bilirubin 0.7 0.3 - 1.2 mg/dL  ? GFR, Estimated >60 >60 mL/min  ? Anion gap 5 5 - 15  ?CBC with Differential  ?Result Value Ref Range  ? WBC 6.0 4.0 - 10.5 K/uL  ? RBC 3.90 3.87 - 5.11 MIL/uL  ? Hemoglobin 12.0 12.0 - 15.0 g/dL  ? HCT 37.8 36.0 - 46.0 %  ? MCV 96.9 80.0 - 100.0 fL  ? MCH 30.8 26.0 - 34.0 pg  ? MCHC 31.7 30.0 - 36.0 g/dL  ? RDW 17.8 (H) 11.5 - 15.5 %  ? Platelets 203 150 - 400 K/uL  ? nRBC 0.0 0.0 - 0.2 %  ? Neutrophils Relative % 65 %  ? Neutro Abs 3.9 1.7 - 7.7 K/uL  ? Lymphocytes Relative 24 %  ? Lymphs Abs 1.5 0.7 - 4.0 K/uL  ? Monocytes Relative 7 %  ? Monocytes Absolute 0.4 0.1 - 1.0 K/uL  ? Eosinophils Relative 2 %  ? Eosinophils Absolute 0.1 0.0 - 0.5 K/uL  ? Basophils Relative 1 %  ? Basophils Absolute 0.0 0.0 - 0.1 K/uL  ? Immature Granulocytes 1 %  ? Abs Immature Granulocytes 0.06 0.00 - 0.07 K/uL  ?Protein, urine, random  ?Result Value Ref Range  ? Total Protein, Urine 14 mg/dL  ? ?   ?Assessment & Plan:  ? ?Problem List Items Addressed This Visit   ? ? Pressure injury of right buttock, stage 1  ? Metastatic adenocarcinoma ([place]) - Primary  ? Colostomy in place [facility])  ? Chemotherapy induced diarrhea  ?  ?Continue with Oncology management of chemotherapy ?Encouraged with updates ? ?Stage 1 pressure ulcer - RESOLVED ?No longer need topical barrier cream, re use if returns or can use preventatively ? ? ?No orders of the defined types were placed in this encounter. ? ? ? ?Follow up plan: ?Return in about 6 months (around 07/05/2022) for 6 month fasting lab only then 1 week later Annual  Physical. ? ? ? ?Nobie Putnam, DO ?[facility]  [facility] ?[facility] ?01/02/2022, 10:04 AM ?

## 2022-01-09 ENCOUNTER — Inpatient Hospital Stay (HOSPITAL_BASED_OUTPATIENT_CLINIC_OR_DEPARTMENT_OTHER): Payer: Medicare Other | Admitting: Oncology

## 2022-01-09 ENCOUNTER — Other Ambulatory Visit: Payer: Self-pay

## 2022-01-09 ENCOUNTER — Inpatient Hospital Stay: Payer: Medicare Other

## 2022-01-09 ENCOUNTER — Encounter: Payer: Self-pay | Admitting: Oncology

## 2022-01-09 VITALS — BP 142/86 | HR 74 | Temp 97.3°F | Wt 162.0 lb

## 2022-01-09 DIAGNOSIS — D701 Agranulocytosis secondary to cancer chemotherapy: Secondary | ICD-10-CM | POA: Diagnosis not present

## 2022-01-09 DIAGNOSIS — I2510 Atherosclerotic heart disease of native coronary artery without angina pectoris: Secondary | ICD-10-CM | POA: Diagnosis not present

## 2022-01-09 DIAGNOSIS — T451X5A Adverse effect of antineoplastic and immunosuppressive drugs, initial encounter: Secondary | ICD-10-CM | POA: Diagnosis not present

## 2022-01-09 DIAGNOSIS — Z86718 Personal history of other venous thrombosis and embolism: Secondary | ICD-10-CM | POA: Diagnosis not present

## 2022-01-09 DIAGNOSIS — R609 Edema, unspecified: Secondary | ICD-10-CM | POA: Diagnosis not present

## 2022-01-09 DIAGNOSIS — M5136 Other intervertebral disc degeneration, lumbar region: Secondary | ICD-10-CM | POA: Diagnosis not present

## 2022-01-09 DIAGNOSIS — K219 Gastro-esophageal reflux disease without esophagitis: Secondary | ICD-10-CM | POA: Diagnosis not present

## 2022-01-09 DIAGNOSIS — C799 Secondary malignant neoplasm of unspecified site: Secondary | ICD-10-CM

## 2022-01-09 DIAGNOSIS — Z7901 Long term (current) use of anticoagulants: Secondary | ICD-10-CM | POA: Diagnosis not present

## 2022-01-09 DIAGNOSIS — K521 Toxic gastroenteritis and colitis: Secondary | ICD-10-CM | POA: Diagnosis not present

## 2022-01-09 DIAGNOSIS — E876 Hypokalemia: Secondary | ICD-10-CM | POA: Diagnosis not present

## 2022-01-09 DIAGNOSIS — Z9221 Personal history of antineoplastic chemotherapy: Secondary | ICD-10-CM | POA: Diagnosis not present

## 2022-01-09 DIAGNOSIS — R5383 Other fatigue: Secondary | ICD-10-CM | POA: Diagnosis not present

## 2022-01-09 DIAGNOSIS — Z86711 Personal history of pulmonary embolism: Secondary | ICD-10-CM | POA: Diagnosis not present

## 2022-01-09 DIAGNOSIS — R197 Diarrhea, unspecified: Secondary | ICD-10-CM | POA: Diagnosis not present

## 2022-01-09 DIAGNOSIS — Z5111 Encounter for antineoplastic chemotherapy: Secondary | ICD-10-CM

## 2022-01-09 DIAGNOSIS — C20 Malignant neoplasm of rectum: Secondary | ICD-10-CM | POA: Diagnosis not present

## 2022-01-09 DIAGNOSIS — I272 Pulmonary hypertension, unspecified: Secondary | ICD-10-CM | POA: Diagnosis not present

## 2022-01-09 DIAGNOSIS — Z5189 Encounter for other specified aftercare: Secondary | ICD-10-CM | POA: Diagnosis not present

## 2022-01-09 DIAGNOSIS — E7800 Pure hypercholesterolemia, unspecified: Secondary | ICD-10-CM | POA: Diagnosis not present

## 2022-01-09 DIAGNOSIS — I10 Essential (primary) hypertension: Secondary | ICD-10-CM | POA: Diagnosis not present

## 2022-01-09 DIAGNOSIS — Z800 Family history of malignant neoplasm of digestive organs: Secondary | ICD-10-CM | POA: Diagnosis not present

## 2022-01-09 DIAGNOSIS — Z79899 Other long term (current) drug therapy: Secondary | ICD-10-CM | POA: Diagnosis not present

## 2022-01-09 LAB — COMPREHENSIVE METABOLIC PANEL
ALT: 29 U/L (ref 0–44)
AST: 28 U/L (ref 15–41)
Albumin: 3.8 g/dL (ref 3.5–5.0)
Alkaline Phosphatase: 101 U/L (ref 38–126)
Anion gap: 5 (ref 5–15)
BUN: 13 mg/dL (ref 8–23)
CO2: 28 mmol/L (ref 22–32)
Calcium: 9 mg/dL (ref 8.9–10.3)
Chloride: 102 mmol/L (ref 98–111)
Creatinine, Ser: 0.81 mg/dL (ref 0.44–1.00)
GFR, Estimated: >60 mL/min (ref >60)
Glucose, Bld: 96 mg/dL (ref 70–99)
Potassium: 4 mmol/L (ref 3.5–5.1)
Sodium: 135 mmol/L (ref 135–145)
Total Bilirubin: 0.2 mg/dL — ABNORMAL LOW (ref 0.3–1.2)
Total Protein: 6.5 g/dL (ref 6.5–8.1)

## 2022-01-09 LAB — CBC WITH DIFFERENTIAL/PLATELET
Abs Immature Granulocytes: 0.09 10*3/uL — ABNORMAL HIGH (ref 0.00–0.07)
Basophils Absolute: 0 10*3/uL (ref 0.0–0.1)
Basophils Relative: 0 %
Eosinophils Absolute: 0.1 10*3/uL (ref 0.0–0.5)
Eosinophils Relative: 2 %
HCT: 37 % (ref 36.0–46.0)
Hemoglobin: 11.9 g/dL — ABNORMAL LOW (ref 12.0–15.0)
Immature Granulocytes: 2 %
Lymphocytes Relative: 22 %
Lymphs Abs: 1.2 10*3/uL (ref 0.7–4.0)
MCH: 30.8 pg (ref 26.0–34.0)
MCHC: 32.2 g/dL (ref 30.0–36.0)
MCV: 95.9 fL (ref 80.0–100.0)
Monocytes Absolute: 0.5 10*3/uL (ref 0.1–1.0)
Monocytes Relative: 10 %
Neutro Abs: 3.6 10*3/uL (ref 1.7–7.7)
Neutrophils Relative %: 64 %
Platelets: 197 10*3/uL (ref 150–400)
RBC: 3.86 MIL/uL — ABNORMAL LOW (ref 3.87–5.11)
RDW: 18.2 % — ABNORMAL HIGH (ref 11.5–15.5)
WBC: 5.5 10*3/uL (ref 4.0–10.5)
nRBC: 0 % (ref 0.0–0.2)

## 2022-01-09 LAB — PROTEIN, URINE, RANDOM: Total Protein, Urine: 14 mg/dL

## 2022-01-09 MED ORDER — SODIUM CHLORIDE 0.9 % IV SOLN
100.0000 mg/m2 | Freq: Once | INTRAVENOUS | Status: AC
  Administered 2022-01-09: 180 mg via INTRAVENOUS
  Filled 2022-01-09: qty 5

## 2022-01-09 MED ORDER — SODIUM CHLORIDE 0.9 % IV SOLN
Freq: Once | INTRAVENOUS | Status: AC
  Filled 2022-01-09: qty 250

## 2022-01-09 MED ORDER — CHOLESTYRAMINE 4 G PO PACK: 4.0000 g | PACK | Freq: Three times a day (TID) | ORAL | 1 refills | Status: DC

## 2022-01-09 MED ORDER — DIPHENHYDRAMINE HCL 50 MG/ML IJ SOLN
25.0000 mg | Freq: Every day | INTRAMUSCULAR | Status: DC | PRN
  Administered 2022-01-09: 25 mg via INTRAVENOUS
  Filled 2022-01-09: qty 1

## 2022-01-09 MED ORDER — LIDOCAINE-PRILOCAINE 2.5-2.5 % EX CREA: 1.0000 "application " | TOPICAL_CREAM | CUTANEOUS | 6 refills | Status: DC | PRN

## 2022-01-09 MED ORDER — PALONOSETRON HCL INJECTION 0.25 MG/5ML
0.2500 mg | Freq: Once | INTRAVENOUS | Status: AC
  Administered 2022-01-09: 0.25 mg via INTRAVENOUS
  Filled 2022-01-09: qty 5

## 2022-01-09 MED ORDER — SODIUM CHLORIDE 0.9 % IV SOLN
750.0000 mg | Freq: Once | INTRAVENOUS | Status: AC
  Administered 2022-01-09: 750 mg via INTRAVENOUS
  Filled 2022-01-09: qty 37.5

## 2022-01-09 MED ORDER — SODIUM CHLORIDE 0.9 % IV SOLN
350.0000 mg | Freq: Once | INTRAVENOUS | Status: AC
  Administered 2022-01-09: 350 mg via INTRAVENOUS
  Filled 2022-01-09: qty 14

## 2022-01-09 MED ORDER — SODIUM CHLORIDE 0.9 % IV SOLN
10.0000 mg | Freq: Once | INTRAVENOUS | Status: AC
  Administered 2022-01-09: 10 mg via INTRAVENOUS
  Filled 2022-01-09: qty 10

## 2022-01-09 MED ORDER — SODIUM CHLORIDE 0.9 % IV SOLN
2400.0000 mg/m2 | INTRAVENOUS | Status: DC
  Administered 2022-01-09: 4300 mg via INTRAVENOUS
  Filled 2022-01-09: qty 86

## 2022-01-09 MED ORDER — ATROPINE SULFATE 1 MG/ML IV SOLN
0.4000 mg | Freq: Once | INTRAVENOUS | Status: AC
  Administered 2022-01-09: 0.4 mg via INTRAVENOUS
  Filled 2022-01-09: qty 1

## 2022-01-09 NOTE — Patient Instructions (Signed)
[facility] CANCER CTR AT [place]-MEDICAL ONCOLOGY  Discharge Instructions: ?Thank you for choosing [facility] to provide your oncology and hematology care.  ?If you have a lab appointment with the Cancer Center, please go directly to the Cancer Center and check in at the registration area. ? ?Wear comfortable clothing and clothing appropriate for easy access to any Portacath or PICC line.  ? ?We strive to give you quality time with your provider. You may need to reschedule your appointment if you arrive late (15 or more minutes).  Arriving late affects you and other patients whose appointments are after yours.  Also, if you miss three or more appointments without notifying the office, you may be dismissed from the clinic at the provider?s discretion.    ?  ?For prescription refill requests, have your pharmacy contact our office and allow 72 hours for refills to be completed.   ? ?  ?To help prevent nausea and vomiting after your treatment, we encourage you to take your nausea medication as directed. ? ?BELOW ARE SYMPTOMS THAT SHOULD BE REPORTED IMMEDIATELY: ?*FEVER GREATER THAN 100.4 F (38 ?C) OR HIGHER ?*CHILLS OR SWEATING ?*NAUSEA AND VOMITING THAT IS NOT CONTROLLED WITH YOUR NAUSEA MEDICATION ?*UNUSUAL SHORTNESS OF BREATH ?*UNUSUAL BRUISING OR BLEEDING ?*URINARY PROBLEMS (pain or burning when urinating, or frequent urination) ?*BOWEL PROBLEMS (unusual diarrhea, constipation, pain near the anus) ?TENDERNESS IN MOUTH AND THROAT WITH OR WITHOUT PRESENCE OF ULCERS (sore throat, sores in mouth, or a toothache) ?UNUSUAL RASH, SWELLING OR PAIN  ?UNUSUAL VAGINAL DISCHARGE OR ITCHING  ? ?Items with * indicate a potential emergency and should be followed up as soon as possible or go to the Emergency Department if any problems should occur. ? ?Please show the CHEMOTHERAPY ALERT CARD or IMMUNOTHERAPY ALERT CARD at check-in to the Emergency Department and triage nurse. ? ?Should you have questions after your visit  or need to cancel or reschedule your appointment, please contact [facility] CANC[facility] AT [place]-MEDICAL ONCOLOGY  [phone] and follow the prompts.  Office hours are 8:00 a.m. to 4:30 p.m. Monday - Friday. Please note that voicemails left after 4:00 p.m. may not be returned until the following business day.  We are closed weekends and major holidays. You have access to a nurse at all times for urgent questions. Please call the main number to the clinic [phone] and follow the prompts. ? ?For any non-urgent questions, you may also contact your provider using MyChart. We now offer e-Visits for anyone 18 and older to request care online for non-urgent symptoms. For details visit [email or web address]. ?  ?Also download the MyChart app! Go to the app store, search "MyChart", open the app, select [place], and log in with your MyChart username and password. ? ?Due to Covid, a mask is required upon entering the hospital/clinic. If you do not have a mask, one will be given to you upon arrival. For doctor visits, patients may have 1 support person aged 18 or older with them. For treatment visits, patients cannot have anyone with them due to current Covid guidelines and our immunocompromised population.  ?

## 2022-01-09 NOTE — Progress Notes (Signed)
Ok to use ua protein result from 12/26/21. Ok to proceed with treatment today per Dr. Tasia Catchings.  ?

## 2022-01-09 NOTE — Progress Notes (Signed)
?Hematology/Oncology progress note ?[phone]  ? ? ?Patient Care Team: ?Olin Hauser, DO as PCP - General (Family Medicine) ?Clent Jacks, RN as Registered Nurse ?Earlie Server, MD as Consulting Physician (Hematology and Oncology) ? ?CHIEF COMPLAINTS/REASON FOR VISIT:  ?Follow up for rectal cancer ? ?HISTORY OF PRESENTING ILLNESS:  ?Patient initially presented with complaints of postmenopausal bleeding on 08/16/2018.  History of was menopausal vaginal bleeding in 2016 which resulted in cervical polypectomy.  Pathology 02/04/2015 showed cervical polyp, consistent with benign endometrial polyp.  [name] after polypectomy due to anxiety associated with pelvic exams. ? pelvic exam on 08/16/2018 reviewed cervical abnormality and from enlarged uterus. ?Seen by Dr. Marcelline Mates on 10/29/2018.  Endometrial biopsy and a Pap smear was performed. ?10/29/2018 Pap smear showed adenocarcinoma, favor endometrial origin. ?10/29/2018 endometrial biopsy showed endometrioid carcinoma, FIGO grade 1. ? ?10/29/2018- TA & TV Ultrasound revealed: Anteverted uterus measuring 8.7 x 5.6 x 6.4 cm without evidence of focal masses.  The endometrium measuring 24.1 mm (thickened) and heterogeneous.  Right and left ovaries not visualized.  No adnexal masses identified.  No free fluid in cul-de-sac. ? ?Patient was seen by Dr. Theora Gianotti in clinic on 11/13/2018.  Cervical exam reveals 2 cm exophytic irregular mass consistent with malignancy.   ?11/19/2018 CT chest abdomen pelvis with contrast showed thickened endometrium with some irregularity compatible with the provided diagnosis of endometrial malignancy.  There is a mildly prominent left inguinal node 1.4 cm.  ?Patient was seen by Dr. Fransisca Connors on 11/20/2018 and left groin lymph node biopsy was recommended. ? ?11/26/2018 patient underwent left inguinal lymph node biopsy. ?Pathology showed metastatic adenocarcinoma consistent with colorectal origin.  CDX 2+. ? ?Case was  discussed on tumor board.  Recommend colonoscopy for further evaluation. ? ?Patient reports significant weight loss 30 pounds over the last year.  Chronic vaginal spotting. ?Change of bowel habits the past few months.  More constipated.  ?Family history positive for brother who has colon cancer prostate cancer. ? ?patient has underwent colonoscopy on 12/03/2018 which reviewed a nonobstructing large mass in the rectum.  Also chronic fistula.  Mass was not circumferential.  This was biopsied with a cold forceps for histology.  Pathology came back hyperplastic polyp negative for dysplasia and malignancy. ?[name], patient underwent flex sigmoidoscopy on 12/06/2018 with rebiopsy of the rectal mass. ?This time biopsy results came back positive for invasive colorectal adenocarcinoma, moderately differentiated. ?Immunotherapy for nearly mismatch repair protein (MMR ) was performed.  There is no loss of MMR expression.  low probability of MSI high. ? ? ?# Seen by [name] for evaluation of resectability for rectal cancer. ?In addition, she also had a second opinion with [facility] pathology where her endometrial biopsy pathology was changed to  adenocarcinoma, consistent with colorectal primary.   ?[name]. She has home health that has been assisting with ostomy care ? ?Patient was also [facility].  Recommendation is to proceed with TNT with concurrent chemoradiation followed by neoadjuvant chemotherapy followed by surgical resection. ?Patient prefers to have treatment done locally with [facility]. ? ? ?# Oncology Treatment:  ?02/03/2019- 03/19/2019  concurrent Xeloda and radiation.  Xeloda dose 861m /m2 BID - rounded to 16552mBID- on days of radiation. ?04/09/2019, started on FOLFOX with bolus early.  Omitted.  ?07/16/2019 finished 8 cycles of FOLFOX. ?09/17/19 APR/posterior vaginectomy/TAH/BSO/VY-flap ?pT4b pN0 with close vaginal margin 0.2 mm.  Uterus and  ovaries negative for malignancy. ?Patient reports  bilateral lower extremity numbness and tingling, intermittent, left worse than right. ?She has lost a lot of weight since her APR surgery.  ? ?#Family history with half brother having's history of colon cancer prostate cancer.  Personal history of colorectal cancer.  Patient has not decided if she wants genetic testing.   ? ?# history of PE( 01/13/2020)  in the bilateral lower extremity DVT (01/13/2020).   ?She finishes 6 months of anticoagulation with Eliquis 5 mg twice daily. Now switched to Eliquis 2.5 mg twice daily.. ? ?# She has now developed recurrent disease. #06/30/20  vaginal introitus mass biopsied. Pathology is consistent with metastatic colorectal adenocarcinoma ?I have discussed with [name]  Dr. Hester Mates and the mass is not resectable. ?Patient has also had colonoscopy by Dr. Vicente Males yesterday. Normal examination. ?# 07/16/2020 cycle 1 FOLFIRI  ?# 07/20/2020 PET scan was done for further evaluation, images are consistent with local recurrence, no distant metastasis. ?#Discussed with radiation oncology Dr. Baruch Gouty will recommends concurrent chemotherapy and radiation. ?08/02/2020-08/16/2020, patient starts radiation.  Xeloda was held due to neutropenia ?08/17/2020,-09/06/2020 Xeloda 1500 mg twice daily concurrently with radiation ? ?01/31/21 started on FOLFIRI + Bev ?05/18/2021 CT chest abdomen pelvis showed ?Previously noted enlargement of bilateral inguinal lymph nodes is resolved, consistent with treatment response of nodal metastatic disease. Interval decrease in size of multiple small bilateral pulmonary nodules, consistent with treatment response of pulmonary metastatic disease. No evidence of new metastatic disease. ?05/24/2021 - 08/30/2021, continued on FOLFIRI plus bevacizumab.  Irinotecan dose was reduced, eventually 176m/m2 ? ?09/02/2021, CT chest abdomen pelvis without contrast ?Showed small bilateral pulmonary nodules, unchanged.  Stable metastatic  disease.  No noncontrast evidence of new metastatic disease in the chest abdomen pelvis.  Small parastomal hernia.  Enlargement of main pulmonary artery.  Coronary artery disease. ? ?09/13/2021, maintenance 5-FU/bevacizumab ?11/28/2021, 5-FU/Irinotecan/bevacizumab.  Irinotecan 100 mg/m2 was added back due to progressively increasing CEA. ? ?INTERVAL HISTORY ?Jacqueline RATHELis a 76y.o. female who has above history reviewed by me presents for follow-up of rectal cancer. ?Currently on dose reduced FOLFIRI/bevacizumab  ?She has had chemotherapy induced diarrhea, sometimes multiple time after eating ?She takes imodium and lomotil with some improvement.  ?She changes her colostomy bag once every other day.  ?Daughter says she has had diarrhea to certain food since her gallbladder surgery.  ? ? ? ? ?Review of Systems  ?Constitutional:  Positive for fatigue. Negative for appetite change, chills, fever and unexpected weight change.  ?HENT:   Negative for hearing loss and voice change.   ?Eyes:  Negative for eye problems.  ?Respiratory:  Negative for chest tightness and cough.   ?Cardiovascular:  Negative for chest pain.  ?Gastrointestinal:  Positive for diarrhea. Negative for abdominal distention, abdominal pain, blood in stool, constipation and nausea.  ?Endocrine: Negative for hot flashes.  ?Genitourinary:  Negative for difficulty urinating and frequency.   ?Musculoskeletal:  Negative for arthralgias.  ?Skin:  Negative for itching and rash.  ?Neurological:  Negative for extremity weakness and numbness.  ?Hematological:  Negative for adenopathy.  ?Psychiatric/Behavioral:  Negative for confusion.   ? ?MEDICAL HISTORY:  ?Past Medical History:  ?Diagnosis Date  ? Allergy   ? Arthritis   ? Blood clot in vein   ? Family history of colon cancer   ? GERD (gastroesophageal reflux disease)   ? Hypercholesteremia   ? Hypertension   ? Hypertension   ? Lower extremity edema   ? Rectal cancer ([place] 12/2018  ? Urinary incontinence    ? ? ?  SURGICAL HISTORY: ?Past Surgical History:  ?Procedure Laterality Date  ? ABDOMINAL HYSTERECTOMY    ? CHOLECYSTECTOMY  1971  ? COLONOSCOPY WITH PROPOFOL N/A 12/03/2018  ? Procedure: COLONOSCOPY WITH PROPOFOL;  S

## 2022-01-10 LAB — CEA: CEA: 716 ng/mL — ABNORMAL HIGH (ref 0.0–4.7)

## 2022-01-11 ENCOUNTER — Inpatient Hospital Stay: Payer: Medicare Other

## 2022-01-11 VITALS — BP 140/80 | HR 76 | Resp 18

## 2022-01-11 DIAGNOSIS — M5136 Other intervertebral disc degeneration, lumbar region: Secondary | ICD-10-CM | POA: Diagnosis not present

## 2022-01-11 DIAGNOSIS — R609 Edema, unspecified: Secondary | ICD-10-CM | POA: Diagnosis not present

## 2022-01-11 DIAGNOSIS — Z800 Family history of malignant neoplasm of digestive organs: Secondary | ICD-10-CM | POA: Diagnosis not present

## 2022-01-11 DIAGNOSIS — E876 Hypokalemia: Secondary | ICD-10-CM | POA: Diagnosis not present

## 2022-01-11 DIAGNOSIS — I272 Pulmonary hypertension, unspecified: Secondary | ICD-10-CM | POA: Diagnosis not present

## 2022-01-11 DIAGNOSIS — I2510 Atherosclerotic heart disease of native coronary artery without angina pectoris: Secondary | ICD-10-CM | POA: Diagnosis not present

## 2022-01-11 DIAGNOSIS — K219 Gastro-esophageal reflux disease without esophagitis: Secondary | ICD-10-CM | POA: Diagnosis not present

## 2022-01-11 DIAGNOSIS — Z9221 Personal history of antineoplastic chemotherapy: Secondary | ICD-10-CM | POA: Diagnosis not present

## 2022-01-11 DIAGNOSIS — C799 Secondary malignant neoplasm of unspecified site: Secondary | ICD-10-CM

## 2022-01-11 DIAGNOSIS — Z86718 Personal history of other venous thrombosis and embolism: Secondary | ICD-10-CM | POA: Diagnosis not present

## 2022-01-11 DIAGNOSIS — Z86711 Personal history of pulmonary embolism: Secondary | ICD-10-CM | POA: Diagnosis not present

## 2022-01-11 DIAGNOSIS — Z79899 Other long term (current) drug therapy: Secondary | ICD-10-CM | POA: Diagnosis not present

## 2022-01-11 DIAGNOSIS — Z5189 Encounter for other specified aftercare: Secondary | ICD-10-CM | POA: Diagnosis not present

## 2022-01-11 DIAGNOSIS — E7800 Pure hypercholesterolemia, unspecified: Secondary | ICD-10-CM | POA: Diagnosis not present

## 2022-01-11 DIAGNOSIS — C20 Malignant neoplasm of rectum: Secondary | ICD-10-CM

## 2022-01-11 DIAGNOSIS — Z7901 Long term (current) use of anticoagulants: Secondary | ICD-10-CM | POA: Diagnosis not present

## 2022-01-11 DIAGNOSIS — R5383 Other fatigue: Secondary | ICD-10-CM | POA: Diagnosis not present

## 2022-01-11 DIAGNOSIS — Z5111 Encounter for antineoplastic chemotherapy: Secondary | ICD-10-CM | POA: Diagnosis not present

## 2022-01-11 DIAGNOSIS — R197 Diarrhea, unspecified: Secondary | ICD-10-CM | POA: Diagnosis not present

## 2022-01-11 DIAGNOSIS — I10 Essential (primary) hypertension: Secondary | ICD-10-CM | POA: Diagnosis not present

## 2022-01-11 MED ORDER — HEPARIN SOD (PORK) LOCK FLUSH 100 UNIT/ML IV SOLN
500.0000 [IU] | Freq: Once | INTRAVENOUS | Status: AC | PRN
  Administered 2022-01-11: 500 [IU]
  Filled 2022-01-11: qty 5

## 2022-01-11 MED ORDER — SODIUM CHLORIDE 0.9% FLUSH
10.0000 mL | INTRAVENOUS | Status: DC | PRN
  Administered 2022-01-11: 10 mL
  Filled 2022-01-11: qty 10

## 2022-01-11 MED ORDER — PEGFILGRASTIM-BMEZ 6 MG/0.6ML ~~LOC~~ SOSY
6.0000 mg | PREFILLED_SYRINGE | Freq: Once | SUBCUTANEOUS | Status: AC
  Administered 2022-01-11: 6 mg via SUBCUTANEOUS
  Filled 2022-01-11: qty 0.6

## 2022-01-16 ENCOUNTER — Ambulatory Visit
Admission: RE | Admit: 2022-01-16 | Discharge: 2022-01-16 | Disposition: A | Discharge status: Home / Self Care | Payer: Medicare Other | Source: Ambulatory Visit | Attending: Oncology | Admitting: Oncology

## 2022-01-16 ENCOUNTER — Encounter: Payer: Self-pay | Admitting: Oncology

## 2022-01-16 DIAGNOSIS — Z1231 Encounter for screening mammogram for malignant neoplasm of breast: Secondary | ICD-10-CM | POA: Diagnosis not present

## 2022-01-16 HISTORY — DX: Personal history of antineoplastic chemotherapy: Z92.21

## 2022-01-20 ENCOUNTER — Ambulatory Visit
Admission: RE | Admit: 2022-01-20 | Discharge: 2022-01-20 | Disposition: A | Discharge status: Home / Self Care | Payer: Medicare Other | Source: Ambulatory Visit | Attending: Oncology | Admitting: Oncology

## 2022-01-20 DIAGNOSIS — M47816 Spondylosis without myelopathy or radiculopathy, lumbar region: Secondary | ICD-10-CM | POA: Diagnosis not present

## 2022-01-20 DIAGNOSIS — K838 Other specified diseases of biliary tract: Secondary | ICD-10-CM | POA: Diagnosis not present

## 2022-01-20 DIAGNOSIS — C799 Secondary malignant neoplasm of unspecified site: Secondary | ICD-10-CM | POA: Diagnosis not present

## 2022-01-20 DIAGNOSIS — C20 Malignant neoplasm of rectum: Secondary | ICD-10-CM | POA: Diagnosis not present

## 2022-01-20 DIAGNOSIS — K8689 Other specified diseases of pancreas: Secondary | ICD-10-CM | POA: Diagnosis not present

## 2022-01-20 DIAGNOSIS — I700 Atherosclerosis of aorta: Secondary | ICD-10-CM | POA: Diagnosis not present

## 2022-01-20 DIAGNOSIS — J984 Other disorders of lung: Secondary | ICD-10-CM | POA: Diagnosis not present

## 2022-01-20 MED ORDER — IOHEXOL 300 MG/ML  SOLN
80.0000 mL | Freq: Once | INTRAMUSCULAR | Status: AC | PRN
  Administered 2022-01-20: 80 mL via INTRAVENOUS

## 2022-01-23 ENCOUNTER — Inpatient Hospital Stay (HOSPITAL_BASED_OUTPATIENT_CLINIC_OR_DEPARTMENT_OTHER): Payer: Medicare Other | Admitting: Oncology

## 2022-01-23 ENCOUNTER — Inpatient Hospital Stay: Payer: Medicare Other

## 2022-01-23 ENCOUNTER — Encounter: Payer: Self-pay | Admitting: Oncology

## 2022-01-23 ENCOUNTER — Inpatient Hospital Stay: Payer: Medicare Other | Attending: Oncology

## 2022-01-23 VITALS — BP 125/63 | HR 66 | Temp 97.7°F | Resp 16 | Ht 65.0 in | Wt 157.8 lb

## 2022-01-23 DIAGNOSIS — Z87891 Personal history of nicotine dependence: Secondary | ICD-10-CM | POA: Diagnosis not present

## 2022-01-23 DIAGNOSIS — C799 Secondary malignant neoplasm of unspecified site: Secondary | ICD-10-CM

## 2022-01-23 DIAGNOSIS — R609 Edema, unspecified: Secondary | ICD-10-CM | POA: Diagnosis not present

## 2022-01-23 DIAGNOSIS — K521 Toxic gastroenteritis and colitis: Secondary | ICD-10-CM | POA: Diagnosis not present

## 2022-01-23 DIAGNOSIS — D701 Agranulocytosis secondary to cancer chemotherapy: Secondary | ICD-10-CM

## 2022-01-23 DIAGNOSIS — Z79899 Other long term (current) drug therapy: Secondary | ICD-10-CM | POA: Insufficient documentation

## 2022-01-23 DIAGNOSIS — Z8042 Family history of malignant neoplasm of prostate: Secondary | ICD-10-CM | POA: Insufficient documentation

## 2022-01-23 DIAGNOSIS — E876 Hypokalemia: Secondary | ICD-10-CM | POA: Diagnosis not present

## 2022-01-23 DIAGNOSIS — C20 Malignant neoplasm of rectum: Secondary | ICD-10-CM | POA: Diagnosis not present

## 2022-01-23 DIAGNOSIS — I10 Essential (primary) hypertension: Secondary | ICD-10-CM | POA: Diagnosis not present

## 2022-01-23 DIAGNOSIS — Z5111 Encounter for antineoplastic chemotherapy: Secondary | ICD-10-CM | POA: Insufficient documentation

## 2022-01-23 DIAGNOSIS — Z86711 Personal history of pulmonary embolism: Secondary | ICD-10-CM | POA: Insufficient documentation

## 2022-01-23 DIAGNOSIS — I700 Atherosclerosis of aorta: Secondary | ICD-10-CM | POA: Insufficient documentation

## 2022-01-23 DIAGNOSIS — E7800 Pure hypercholesterolemia, unspecified: Secondary | ICD-10-CM | POA: Insufficient documentation

## 2022-01-23 DIAGNOSIS — K219 Gastro-esophageal reflux disease without esophagitis: Secondary | ICD-10-CM | POA: Insufficient documentation

## 2022-01-23 DIAGNOSIS — Z800 Family history of malignant neoplasm of digestive organs: Secondary | ICD-10-CM | POA: Insufficient documentation

## 2022-01-23 DIAGNOSIS — T451X5A Adverse effect of antineoplastic and immunosuppressive drugs, initial encounter: Secondary | ICD-10-CM | POA: Diagnosis not present

## 2022-01-23 DIAGNOSIS — Z86718 Personal history of other venous thrombosis and embolism: Secondary | ICD-10-CM | POA: Insufficient documentation

## 2022-01-23 DIAGNOSIS — Z5112 Encounter for antineoplastic immunotherapy: Secondary | ICD-10-CM | POA: Insufficient documentation

## 2022-01-23 LAB — COMPREHENSIVE METABOLIC PANEL
ALT: 18 U/L (ref 0–44)
AST: 28 U/L (ref 15–41)
Albumin: 4 g/dL (ref 3.5–5.0)
Alkaline Phosphatase: 72 U/L (ref 38–126)
Anion gap: 8 (ref 5–15)
BUN: 15 mg/dL (ref 8–23)
CO2: 27 mmol/L (ref 22–32)
Calcium: 9.5 mg/dL (ref 8.9–10.3)
Chloride: 102 mmol/L (ref 98–111)
Creatinine, Ser: 0.91 mg/dL (ref 0.44–1.00)
GFR, Estimated: >60 mL/min (ref >60)
Glucose, Bld: 91 mg/dL (ref 70–99)
Potassium: 4 mmol/L (ref 3.5–5.1)
Sodium: 137 mmol/L (ref 135–145)
Total Bilirubin: 0.8 mg/dL (ref 0.3–1.2)
Total Protein: 6.8 g/dL (ref 6.5–8.1)

## 2022-01-23 LAB — CBC WITH DIFFERENTIAL/PLATELET
Abs Immature Granulocytes: 0.04 10*3/uL (ref 0.00–0.07)
Basophils Absolute: 0 10*3/uL (ref 0.0–0.1)
Basophils Relative: 1 %
Eosinophils Absolute: 0.2 10*3/uL (ref 0.0–0.5)
Eosinophils Relative: 3 %
HCT: 38.2 % (ref 36.0–46.0)
Hemoglobin: 12.2 g/dL (ref 12.0–15.0)
Immature Granulocytes: 1 %
Lymphocytes Relative: 21 %
Lymphs Abs: 1.2 10*3/uL (ref 0.7–4.0)
MCH: 30.3 pg (ref 26.0–34.0)
MCHC: 31.9 g/dL (ref 30.0–36.0)
MCV: 95 fL (ref 80.0–100.0)
Monocytes Absolute: 0.7 10*3/uL (ref 0.1–1.0)
Monocytes Relative: 12 %
Neutro Abs: 3.5 10*3/uL (ref 1.7–7.7)
Neutrophils Relative %: 62 %
Platelets: 200 10*3/uL (ref 150–400)
RBC: 4.02 MIL/uL (ref 3.87–5.11)
RDW: 17.9 % — ABNORMAL HIGH (ref 11.5–15.5)
WBC: 5.6 10*3/uL (ref 4.0–10.5)
nRBC: 0 % (ref 0.0–0.2)

## 2022-01-23 LAB — PROTEIN, URINE, RANDOM: Total Protein, Urine: 17 mg/dL

## 2022-01-23 MED ORDER — SODIUM CHLORIDE 0.9 % IV SOLN
2400.0000 mg/m2 | INTRAVENOUS | Status: DC
  Administered 2022-01-23: 4300 mg via INTRAVENOUS
  Filled 2022-01-23: qty 86

## 2022-01-23 MED ORDER — ATROPINE SULFATE 1 MG/ML IV SOLN
0.4000 mg | Freq: Once | INTRAVENOUS | Status: AC
  Administered 2022-01-23: 0.4 mg via INTRAVENOUS
  Filled 2022-01-23: qty 1

## 2022-01-23 MED ORDER — DIPHENHYDRAMINE HCL 50 MG/ML IJ SOLN
25.0000 mg | Freq: Every day | INTRAMUSCULAR | Status: DC | PRN
  Administered 2022-01-23: 25 mg via INTRAVENOUS
  Filled 2022-01-23: qty 1

## 2022-01-23 MED ORDER — SODIUM CHLORIDE 0.9 % IV SOLN
750.0000 mg | Freq: Once | INTRAVENOUS | Status: AC
  Administered 2022-01-23: 750 mg via INTRAVENOUS
  Filled 2022-01-23: qty 37.5

## 2022-01-23 MED ORDER — SODIUM CHLORIDE 0.9 % IV SOLN
100.0000 mg/m2 | Freq: Once | INTRAVENOUS | Status: AC
  Administered 2022-01-23: 180 mg via INTRAVENOUS
  Filled 2022-01-23: qty 5

## 2022-01-23 MED ORDER — PALONOSETRON HCL INJECTION 0.25 MG/5ML
0.2500 mg | Freq: Once | INTRAVENOUS | Status: AC
  Administered 2022-01-23: 0.25 mg via INTRAVENOUS
  Filled 2022-01-23: qty 5

## 2022-01-23 MED ORDER — SODIUM CHLORIDE 0.9 % IV SOLN
Freq: Once | INTRAVENOUS | Status: AC
  Filled 2022-01-23: qty 250

## 2022-01-23 MED ORDER — SODIUM CHLORIDE 0.9 % IV SOLN
10.0000 mg | Freq: Once | INTRAVENOUS | Status: AC
  Administered 2022-01-23: 10 mg via INTRAVENOUS
  Filled 2022-01-23: qty 10

## 2022-01-23 MED ORDER — SODIUM CHLORIDE 0.9 % IV SOLN
350.0000 mg | Freq: Once | INTRAVENOUS | Status: AC
  Administered 2022-01-23: 350 mg via INTRAVENOUS
  Filled 2022-01-23: qty 14

## 2022-01-23 NOTE — Progress Notes (Signed)
?Hematology/Oncology progress note ?[phone]  ? ? ?Patient Care Team: ?Olin Hauser, DO as PCP - General (Family Medicine) ?Clent Jacks, RN as Registered Nurse ?Earlie Server, MD as Consulting Physician (Hematology and Oncology) ? ?CHIEF COMPLAINTS/REASON FOR VISIT:  ?Follow up for rectal cancer ? ?HISTORY OF PRESENTING ILLNESS:  ?Patient initially presented with complaints of postmenopausal bleeding on 08/16/2018.  History of was menopausal vaginal bleeding in 2016 which resulted in cervical polypectomy.  Pathology 02/04/2015 showed cervical polyp, consistent with benign endometrial polyp.  [name] after polypectomy due to anxiety associated with pelvic exams. ? pelvic exam on 08/16/2018 reviewed cervical abnormality and from enlarged uterus. ?Seen by Dr. Marcelline Mates on 10/29/2018.  Endometrial biopsy and a Pap smear was performed. ?10/29/2018 Pap smear showed adenocarcinoma, favor endometrial origin. ?10/29/2018 endometrial biopsy showed endometrioid carcinoma, FIGO grade 1. ? ?10/29/2018- TA & TV Ultrasound revealed: Anteverted uterus measuring 8.7 x 5.6 x 6.4 cm without evidence of focal masses.  The endometrium measuring 24.1 mm (thickened) and heterogeneous.  Right and left ovaries not visualized.  No adnexal masses identified.  No free fluid in cul-de-sac. ? ?Patient was seen by Dr. Theora Gianotti in clinic on 11/13/2018.  Cervical exam reveals 2 cm exophytic irregular mass consistent with malignancy.   ?11/19/2018 CT chest abdomen pelvis with contrast showed thickened endometrium with some irregularity compatible with the provided diagnosis of endometrial malignancy.  There is a mildly prominent left inguinal node 1.4 cm.  ?Patient was seen by Dr. Fransisca Connors on 11/20/2018 and left groin lymph node biopsy was recommended. ? ?11/26/2018 patient underwent left inguinal lymph node biopsy. ?Pathology showed metastatic adenocarcinoma consistent with colorectal origin.  CDX 2+. ? ?Case was  discussed on tumor board.  Recommend colonoscopy for further evaluation. ? ?Patient reports significant weight loss 30 pounds over the last year.  Chronic vaginal spotting. ?Change of bowel habits the past few months.  More constipated.  ?Family history positive for brother who has colon cancer prostate cancer. ? ?patient has underwent colonoscopy on 12/03/2018 which reviewed a nonobstructing large mass in the rectum.  Also chronic fistula.  Mass was not circumferential.  This was biopsied with a cold forceps for histology.  Pathology came back hyperplastic polyp negative for dysplasia and malignancy. ?[name], patient underwent flex sigmoidoscopy on 12/06/2018 with rebiopsy of the rectal mass. ?This time biopsy results came back positive for invasive colorectal adenocarcinoma, moderately differentiated. ?Immunotherapy for nearly mismatch repair protein (MMR ) was performed.  There is no loss of MMR expression.  low probability of MSI high. ? ? ?# Seen by [name] for evaluation of resectability for rectal cancer. ?In addition, she also had a second opinion with [facility] where her endometrial biopsy pathology was changed to  adenocarcinoma, consistent with colorectal primary.   ?[name]. She has home health that has been assisting with ostomy care ? ?Patient was also evaluated by [facility] oncology.  Recommendation is to proceed with TNT with concurrent chemoradiation followed by neoadjuvant chemotherapy followed by surgical resection. ?Patient prefers to have treatment done locally with [facility]. ? ? ?# Oncology Treatment:  ?02/03/2019- 03/19/2019  concurrent Xeloda and radiation.  Xeloda dose 880m /m2 BID - rounded to 16533mBID- on days of radiation. ?04/09/2019, started on FOLFOX with bolus early.  Omitted.  ?07/16/2019 finished 8 cycles of FOLFOX. ?09/17/19 APR/posterior vaginectomy/TAH/BSO/VY-flap ?pT4b pN0 with close vaginal margin 0.2 mm.  Uterus and  ovaries negative for malignancy. ?Patient reports  bilateral lower extremity numbness and tingling, intermittent, left worse than right. ?She has lost a lot of weight since her APR surgery.  ? ?#Family history with half brother having's history of colon cancer prostate cancer.  Personal history of colorectal cancer.  Patient has not decided if she wants genetic testing.   ? ?# history of PE( 01/13/2020)  in the bilateral lower extremity DVT (01/13/2020).   ?She finishes 6 months of anticoagulation with Eliquis 5 mg twice daily. Now switched to Eliquis 2.5 mg twice daily.. ? ?# She has now developed recurrent disease. #06/30/20  vaginal introitus mass biopsied. Pathology is consistent with metastatic colorectal adenocarcinoma ?I have discussed with [name]  Dr. Hester Mates and the mass is not resectable. ?Patient has also had colonoscopy by Dr. Vicente Males yesterday. Normal examination. ?# 07/16/2020 cycle 1 FOLFIRI  ?# 07/20/2020 PET scan was done for further evaluation, images are consistent with local recurrence, no distant metastasis. ?#Discussed with radiation oncology Dr. Baruch Gouty will recommends concurrent chemotherapy and radiation. ?08/02/2020-08/16/2020, patient starts radiation.  Xeloda was held due to neutropenia ?08/17/2020,-09/06/2020 Xeloda 1500 mg twice daily concurrently with radiation ? ?01/31/21 started on FOLFIRI + Bev ?05/18/2021 CT chest abdomen pelvis showed ?Previously noted enlargement of bilateral inguinal lymph nodes is resolved, consistent with treatment response of nodal metastatic disease. Interval decrease in size of multiple small bilateral pulmonary nodules, consistent with treatment response of pulmonary metastatic disease. No evidence of new metastatic disease. ?05/24/2021 - 08/30/2021, continued on FOLFIRI plus bevacizumab.  Irinotecan dose was reduced, eventually 171m/m2 ? ?09/02/2021, CT chest abdomen pelvis without contrast ?Showed small bilateral pulmonary nodules, unchanged.  Stable metastatic  disease.  No noncontrast evidence of new metastatic disease in the chest abdomen pelvis.  Small parastomal hernia.  Enlargement of main pulmonary artery.  Coronary artery disease. ? ?09/13/2021, maintenance 5-FU/bevacizumab ?11/28/2021, 5-FU/Irinotecan/bevacizumab.  Irinotecan 100 mg/m2 was added back due to progressively increasing CEA. ? ?INTERVAL HISTORY ?Jacqueline KINTZis a 76y.o. female who has above history reviewed by me presents for follow-up of rectal cancer. ?Currently on dose reduced FOLFIRI/bevacizumab  ?chemotherapy induced diarrhea, sometimes multiple time after eating ?She takes imodium and lomotil with some improvement.  ?Started cholestyramine, she only taken 1-2 times. ?Otherwise no new complaints ?.Marland Kitchen She has had a CT scan done during interval. ? ? ?Review of Systems  ?Constitutional:  Positive for fatigue. Negative for appetite change, chills, fever and unexpected weight change.  ?HENT:   Negative for hearing loss and voice change.   ?Eyes:  Negative for eye problems.  ?Respiratory:  Negative for chest tightness and cough.   ?Cardiovascular:  Negative for chest pain.  ?Gastrointestinal:  Positive for diarrhea. Negative for abdominal distention, abdominal pain, blood in stool, constipation and nausea.  ?Endocrine: Negative for hot flashes.  ?Genitourinary:  Negative for difficulty urinating and frequency.   ?Musculoskeletal:  Negative for arthralgias.  ?Skin:  Negative for itching and rash.  ?Neurological:  Negative for extremity weakness and numbness.  ?Hematological:  Negative for adenopathy.  ?Psychiatric/Behavioral:  Negative for confusion.   ? ?MEDICAL HISTORY:  ?Past Medical History:  ?Diagnosis Date  ? Allergy   ? Arthritis   ? Blood clot in vein   ? Family history of colon cancer   ? GERD (gastroesophageal reflux disease)   ? Hypercholesteremia   ? Hypertension   ? Hypertension   ? Lower extremity edema   ? Personal history of chemotherapy   ? [place]  ? Urinary  incontinence   ? ? ?  SURGICAL HISTORY: ?Past Surgical History:  ?Procedure Laterality Date  ? ABDOMINAL HYSTERECTOMY    ? CHOLECYSTECTOMY  1971  ? COLONOSCOPY WITH PROPOFOL N/A 12/03/2018  ? Procedure: COLONOSCOPY WITH P

## 2022-01-24 LAB — CEA: CEA: 833 ng/mL — ABNORMAL HIGH (ref 0.0–4.7)

## 2022-01-25 ENCOUNTER — Inpatient Hospital Stay: Payer: Medicare Other

## 2022-01-25 VITALS — BP 139/71 | HR 78 | Temp 96.5°F | Resp 18

## 2022-01-25 DIAGNOSIS — Z79899 Other long term (current) drug therapy: Secondary | ICD-10-CM | POA: Diagnosis not present

## 2022-01-25 DIAGNOSIS — C20 Malignant neoplasm of rectum: Secondary | ICD-10-CM | POA: Diagnosis not present

## 2022-01-25 DIAGNOSIS — C799 Secondary malignant neoplasm of unspecified site: Secondary | ICD-10-CM

## 2022-01-25 DIAGNOSIS — Z87891 Personal history of nicotine dependence: Secondary | ICD-10-CM | POA: Diagnosis not present

## 2022-01-25 DIAGNOSIS — Z800 Family history of malignant neoplasm of digestive organs: Secondary | ICD-10-CM | POA: Diagnosis not present

## 2022-01-25 DIAGNOSIS — E7800 Pure hypercholesterolemia, unspecified: Secondary | ICD-10-CM | POA: Diagnosis not present

## 2022-01-25 DIAGNOSIS — Z5111 Encounter for antineoplastic chemotherapy: Secondary | ICD-10-CM | POA: Diagnosis not present

## 2022-01-25 DIAGNOSIS — K219 Gastro-esophageal reflux disease without esophagitis: Secondary | ICD-10-CM | POA: Diagnosis not present

## 2022-01-25 DIAGNOSIS — Z86711 Personal history of pulmonary embolism: Secondary | ICD-10-CM | POA: Diagnosis not present

## 2022-01-25 DIAGNOSIS — R609 Edema, unspecified: Secondary | ICD-10-CM | POA: Diagnosis not present

## 2022-01-25 DIAGNOSIS — I700 Atherosclerosis of aorta: Secondary | ICD-10-CM | POA: Diagnosis not present

## 2022-01-25 DIAGNOSIS — I10 Essential (primary) hypertension: Secondary | ICD-10-CM | POA: Diagnosis not present

## 2022-01-25 DIAGNOSIS — Z5112 Encounter for antineoplastic immunotherapy: Secondary | ICD-10-CM | POA: Diagnosis not present

## 2022-01-25 DIAGNOSIS — Z86718 Personal history of other venous thrombosis and embolism: Secondary | ICD-10-CM | POA: Diagnosis not present

## 2022-01-25 DIAGNOSIS — E876 Hypokalemia: Secondary | ICD-10-CM | POA: Diagnosis not present

## 2022-01-25 MED ORDER — SODIUM CHLORIDE 0.9% FLUSH
10.0000 mL | INTRAVENOUS | Status: DC | PRN
  Administered 2022-01-25: 10 mL
  Filled 2022-01-25: qty 10

## 2022-01-25 MED ORDER — PEGFILGRASTIM-BMEZ 6 MG/0.6ML ~~LOC~~ SOSY
6.0000 mg | PREFILLED_SYRINGE | Freq: Once | SUBCUTANEOUS | Status: AC
  Administered 2022-01-25: 6 mg via SUBCUTANEOUS
  Filled 2022-01-25: qty 0.6

## 2022-01-25 MED ORDER — HEPARIN SOD (PORK) LOCK FLUSH 100 UNIT/ML IV SOLN
500.0000 [IU] | Freq: Once | INTRAVENOUS | Status: AC | PRN
  Administered 2022-01-25: 500 [IU]
  Filled 2022-01-25: qty 5

## 2022-01-27 DIAGNOSIS — Z933 Colostomy status: Secondary | ICD-10-CM | POA: Diagnosis not present

## 2022-02-06 ENCOUNTER — Encounter: Payer: Self-pay | Admitting: Oncology

## 2022-02-06 ENCOUNTER — Inpatient Hospital Stay (HOSPITAL_BASED_OUTPATIENT_CLINIC_OR_DEPARTMENT_OTHER): Payer: Medicare Other | Admitting: Oncology

## 2022-02-06 ENCOUNTER — Inpatient Hospital Stay: Payer: Medicare Other

## 2022-02-06 VITALS — BP 124/67 | HR 65 | Temp 96.5°F | Resp 16 | Ht 65.0 in | Wt 159.0 lb

## 2022-02-06 DIAGNOSIS — T451X5A Adverse effect of antineoplastic and immunosuppressive drugs, initial encounter: Secondary | ICD-10-CM

## 2022-02-06 DIAGNOSIS — Z86718 Personal history of other venous thrombosis and embolism: Secondary | ICD-10-CM

## 2022-02-06 DIAGNOSIS — R609 Edema, unspecified: Secondary | ICD-10-CM | POA: Diagnosis not present

## 2022-02-06 DIAGNOSIS — I700 Atherosclerosis of aorta: Secondary | ICD-10-CM | POA: Diagnosis not present

## 2022-02-06 DIAGNOSIS — Z5111 Encounter for antineoplastic chemotherapy: Secondary | ICD-10-CM

## 2022-02-06 DIAGNOSIS — C799 Secondary malignant neoplasm of unspecified site: Secondary | ICD-10-CM | POA: Diagnosis not present

## 2022-02-06 DIAGNOSIS — Z79899 Other long term (current) drug therapy: Secondary | ICD-10-CM | POA: Diagnosis not present

## 2022-02-06 DIAGNOSIS — C20 Malignant neoplasm of rectum: Secondary | ICD-10-CM

## 2022-02-06 DIAGNOSIS — I10 Essential (primary) hypertension: Secondary | ICD-10-CM | POA: Diagnosis not present

## 2022-02-06 DIAGNOSIS — Z86711 Personal history of pulmonary embolism: Secondary | ICD-10-CM | POA: Diagnosis not present

## 2022-02-06 DIAGNOSIS — E7800 Pure hypercholesterolemia, unspecified: Secondary | ICD-10-CM | POA: Diagnosis not present

## 2022-02-06 DIAGNOSIS — E876 Hypokalemia: Secondary | ICD-10-CM

## 2022-02-06 DIAGNOSIS — Z5112 Encounter for antineoplastic immunotherapy: Secondary | ICD-10-CM | POA: Diagnosis not present

## 2022-02-06 DIAGNOSIS — K521 Toxic gastroenteritis and colitis: Secondary | ICD-10-CM | POA: Diagnosis not present

## 2022-02-06 DIAGNOSIS — K219 Gastro-esophageal reflux disease without esophagitis: Secondary | ICD-10-CM | POA: Diagnosis not present

## 2022-02-06 DIAGNOSIS — Z87891 Personal history of nicotine dependence: Secondary | ICD-10-CM | POA: Diagnosis not present

## 2022-02-06 DIAGNOSIS — Z800 Family history of malignant neoplasm of digestive organs: Secondary | ICD-10-CM | POA: Diagnosis not present

## 2022-02-06 LAB — COMPREHENSIVE METABOLIC PANEL
ALT: 15 U/L (ref 0–44)
AST: 26 U/L (ref 15–41)
Albumin: 3.9 g/dL (ref 3.5–5.0)
Alkaline Phosphatase: 73 U/L (ref 38–126)
Anion gap: 7 (ref 5–15)
BUN: 14 mg/dL (ref 8–23)
CO2: 26 mmol/L (ref 22–32)
Calcium: 9.5 mg/dL (ref 8.9–10.3)
Chloride: 106 mmol/L (ref 98–111)
Creatinine, Ser: 0.89 mg/dL (ref 0.44–1.00)
GFR, Estimated: >60 mL/min (ref >60)
Glucose, Bld: 96 mg/dL (ref 70–99)
Potassium: 4.3 mmol/L (ref 3.5–5.1)
Sodium: 139 mmol/L (ref 135–145)
Total Bilirubin: 0.2 mg/dL — ABNORMAL LOW (ref 0.3–1.2)
Total Protein: 6.5 g/dL (ref 6.5–8.1)

## 2022-02-06 LAB — CBC WITH DIFFERENTIAL/PLATELET
Abs Immature Granulocytes: 0.16 10*3/uL — ABNORMAL HIGH (ref 0.00–0.07)
Basophils Absolute: 0 10*3/uL (ref 0.0–0.1)
Basophils Relative: 1 %
Eosinophils Absolute: 0.1 10*3/uL (ref 0.0–0.5)
Eosinophils Relative: 2 %
HCT: 38.3 % (ref 36.0–46.0)
Hemoglobin: 12.1 g/dL (ref 12.0–15.0)
Immature Granulocytes: 3 %
Lymphocytes Relative: 20 %
Lymphs Abs: 1.1 10*3/uL (ref 0.7–4.0)
MCH: 30.6 pg (ref 26.0–34.0)
MCHC: 31.6 g/dL (ref 30.0–36.0)
MCV: 96.7 fL (ref 80.0–100.0)
Monocytes Absolute: 0.5 10*3/uL (ref 0.1–1.0)
Monocytes Relative: 10 %
Neutro Abs: 3.4 10*3/uL (ref 1.7–7.7)
Neutrophils Relative %: 64 %
Platelets: 209 10*3/uL (ref 150–400)
RBC: 3.96 MIL/uL (ref 3.87–5.11)
RDW: 17.9 % — ABNORMAL HIGH (ref 11.5–15.5)
WBC: 5.4 10*3/uL (ref 4.0–10.5)
nRBC: 0 % (ref 0.0–0.2)

## 2022-02-06 LAB — PROTEIN, URINE, RANDOM: Total Protein, Urine: 18 mg/dL

## 2022-02-06 MED ORDER — SODIUM CHLORIDE 0.9% FLUSH
10.0000 mL | Freq: Once | INTRAVENOUS | Status: AC
  Administered 2022-02-06: 10 mL via INTRAVENOUS
  Filled 2022-02-06: qty 10

## 2022-02-06 MED ORDER — SODIUM CHLORIDE 0.9 % IV SOLN
10.0000 mg | Freq: Once | INTRAVENOUS | Status: AC
  Administered 2022-02-06: 10 mg via INTRAVENOUS
  Filled 2022-02-06: qty 10

## 2022-02-06 MED ORDER — SODIUM CHLORIDE 0.9 % IV SOLN
Freq: Once | INTRAVENOUS | Status: AC
  Filled 2022-02-06: qty 250

## 2022-02-06 MED ORDER — SODIUM CHLORIDE 0.9 % IV SOLN
350.0000 mg | Freq: Once | INTRAVENOUS | Status: AC
  Administered 2022-02-06: 350 mg via INTRAVENOUS
  Filled 2022-02-06: qty 14

## 2022-02-06 MED ORDER — ATROPINE SULFATE 1 MG/ML IV SOLN
0.4000 mg | Freq: Once | INTRAVENOUS | Status: AC
  Administered 2022-02-06: 0.4 mg via INTRAVENOUS
  Filled 2022-02-06: qty 1

## 2022-02-06 MED ORDER — PALONOSETRON HCL INJECTION 0.25 MG/5ML
0.2500 mg | Freq: Once | INTRAVENOUS | Status: AC
  Administered 2022-02-06: 0.25 mg via INTRAVENOUS
  Filled 2022-02-06: qty 5

## 2022-02-06 MED ORDER — SODIUM CHLORIDE 0.9 % IV SOLN
750.0000 mg | Freq: Once | INTRAVENOUS | Status: AC
  Administered 2022-02-06: 750 mg via INTRAVENOUS
  Filled 2022-02-06: qty 37.5

## 2022-02-06 MED ORDER — SODIUM CHLORIDE 0.9 % IV SOLN
2400.0000 mg/m2 | INTRAVENOUS | Status: DC
  Administered 2022-02-06: 4300 mg via INTRAVENOUS
  Filled 2022-02-06: qty 86

## 2022-02-06 MED ORDER — DIPHENHYDRAMINE HCL 50 MG/ML IJ SOLN
25.0000 mg | Freq: Every day | INTRAMUSCULAR | Status: DC | PRN
  Administered 2022-02-06: 25 mg via INTRAVENOUS
  Filled 2022-02-06: qty 1

## 2022-02-06 MED ORDER — SODIUM CHLORIDE 0.9 % IV SOLN
100.0000 mg/m2 | Freq: Once | INTRAVENOUS | Status: AC
  Administered 2022-02-06: 180 mg via INTRAVENOUS
  Filled 2022-02-06: qty 5

## 2022-02-06 NOTE — Progress Notes (Signed)
Per MD and treatment team okay to proceed with treatment and okay to use last urine protein result from 01/23/2022. ? ?Cynda Soule  ?

## 2022-02-06 NOTE — Patient Instructions (Signed)
[facility] CANCER CTR AT [place]  Discharge Instructions: ?Thank you for choosing [facility] to provide your oncology and hematology care.  ?If you have a lab appointment with the [place], please go directly to the [facility] [facility] and check in at the registration area. ? ?Wear comfortable clothing and clothing appropriate for easy access to any Portacath or PICC line.  ? ?We strive to give you quality time with your provider. You may need to reschedule your appointment if you arrive late (15 or more minutes).  Arriving late affects you and other patients whose appointments are after yours.  Also, if you miss three or more appointments without notifying the office, you may be dismissed from the clinic at the provider?s discretion.    ?  ?For prescription refill requests, have your pharmacy contact our office and allow 72 hours for refills to be completed.   ? ?Today you received the following chemotherapy and/or immunotherapy agents Mvasi, Irinotecan, leucovorin, adrucil  ?  ?To help prevent nausea and vomiting after your treatment, we encourage you to take your nausea medication as directed. ? ?BELOW ARE SYMPTOMS THAT SHOULD BE REPORTED IMMEDIATELY: ?*FEVER GREATER THAN 100.4 F (38 ?C) OR HIGHER ?*CHILLS OR SWEATING ?*NAUSEA AND VOMITING THAT IS NOT CONTROLLED WITH YOUR NAUSEA MEDICATION ?*UNUSUAL SHORTNESS OF BREATH ?*UNUSUAL BRUISING OR BLEEDING ?*URINARY PROBLEMS (pain or burning when urinating, or frequent urination) ?*BOWEL PROBLEMS (unusual diarrhea, constipation, pain near the anus) ?TENDERNESS IN MOUTH AND THROAT WITH OR WITHOUT PRESENCE OF ULCERS (sore throat, sores in mouth, or a toothache) ?UNUSUAL RASH, SWELLING OR PAIN  ?UNUSUAL VAGINAL DISCHARGE OR ITCHING  ? ?Items with * indicate a potential emergency and should be followed up as soon as possible or go to the Emergency Department if any problems should occur. ? ?Please show the CHEMOTHERAPY ALERT CARD or IMMUNOTHERAPY  ALERT CARD at check-in to the Emergency Department and triage nurse. ? ?Should you have questions after your visit or need to cancel or reschedule your appointment, please contact [facility] CANCER [place] AT [place]  [phone] and follow the prompts.  Office hours are 8:00 a.m. to 4:30 p.m. Monday - Friday. Please note that voicemails left after 4:00 p.m. may not be returned until the following business day.  We are closed weekends and major holidays. You have access to a nurse at all times for urgent questions. Please call the main number to the clinic [phone] and follow the prompts. ? ?For any non-urgent questions, you may also contact your provider using MyChart. We now offer e-Visits for anyone 75 and older to request care online for non-urgent symptoms. For details visit [email or web address]. ?  ?Also download the MyChart app! Go to the app store, search "MyChart", open the app, select [place], and log in with your MyChart username and password. ? ?Due to Covid, a mask is required upon entering the hospital/clinic. If you do not have a mask, one will be given to you upon arrival. For doctor visits, patients may have 1 support person aged 75 or older with them. For treatment visits, patients cannot have anyone with them due to current Covid guidelines and our immunocompromised population.  ?

## 2022-02-06 NOTE — Progress Notes (Signed)
?Hematology/Oncology progress note ?[phone]  ? ? ?Patient Care Team: ?Olin Hauser, DO as PCP - General (Family Medicine) ?Clent Jacks, RN as Registered Nurse ?Earlie Server, MD as Consulting Physician (Hematology and Oncology) ? ?CHIEF COMPLAINTS/REASON FOR VISIT:  ?Follow up for rectal cancer ? ?HISTORY OF PRESENTING ILLNESS:  ?Patient initially presented with complaints of postmenopausal bleeding on 08/16/2018.  History of was menopausal vaginal bleeding in 2016 which resulted in cervical polypectomy.  Pathology 02/04/2015 showed cervical polyp, consistent with benign endometrial polyp.  [name] after polypectomy due to anxiety associated with pelvic exams. ? pelvic exam on 08/16/2018 reviewed cervical abnormality and from enlarged uterus. ?Seen by Dr. Marcelline Mates on 10/29/2018.  Endometrial biopsy and a Pap smear was performed. ?10/29/2018 Pap smear showed adenocarcinoma, favor endometrial origin. ?10/29/2018 endometrial biopsy showed endometrioid carcinoma, FIGO grade 1. ? ?10/29/2018- TA & TV Ultrasound revealed: Anteverted uterus measuring 8.7 x 5.6 x 6.4 cm without evidence of focal masses.  The endometrium measuring 24.1 mm (thickened) and heterogeneous.  Right and left ovaries not visualized.  No adnexal masses identified.  No free fluid in cul-de-sac. ? ?Patient was seen by Dr. Theora Gianotti in clinic on 11/13/2018.  Cervical exam reveals 2 cm exophytic irregular mass consistent with malignancy.   ?11/19/2018 CT chest abdomen pelvis with contrast showed thickened endometrium with some irregularity compatible with the provided diagnosis of endometrial malignancy.  There is a mildly prominent left inguinal node 1.4 cm.  ?Patient was seen by Dr. Fransisca Connors on 11/20/2018 and left groin lymph node biopsy was recommended. ? ?11/26/2018 patient underwent left inguinal lymph node biopsy. ?Pathology showed metastatic adenocarcinoma consistent with colorectal origin.  CDX 2+. ? ?Case was  discussed on tumor board.  Recommend colonoscopy for further evaluation. ? ?Patient reports significant weight loss 30 pounds over the last year.  Chronic vaginal spotting. ?Change of bowel habits the past few months.  More constipated.  ?Family history positive for brother who has colon cancer prostate cancer. ? ?patient has underwent colonoscopy on 12/03/2018 which reviewed a nonobstructing large mass in the rectum.  Also chronic fistula.  Mass was not circumferential.  This was biopsied with a cold forceps for histology.  Pathology came back hyperplastic polyp negative for dysplasia and malignancy. ?[name], patient underwent flex sigmoidoscopy on 12/06/2018 with rebiopsy of the rectal mass. ?This time biopsy results came back positive for invasive colorectal adenocarcinoma, moderately differentiated. ?Immunotherapy for nearly mismatch repair protein (MMR ) was performed.  There is no loss of MMR expression.  low probability of MSI high. ? ? ?# Seen by [name] for evaluation of resectability for rectal cancer. ?In addition, she also had a second opinion with [facility] where her endometrial biopsy pathology was changed to  adenocarcinoma, consistent with colorectal primary.   ?[name]. She has home health that has been assisting with ostomy care ? ?Patient was also evaluated by [facility] oncology.  Recommendation is to proceed with TNT with concurrent chemoradiation followed by neoadjuvant chemotherapy followed by surgical resection. ?Patient prefers to have treatment done locally with [facility]. ? ? ?# Oncology Treatment:  ?02/03/2019- 03/19/2019  concurrent Xeloda and radiation.  Xeloda dose 812m /m2 BID - rounded to 165101mBID- on days of radiation. ?04/09/2019, started on FOLFOX with bolus early.  Omitted.  ?07/16/2019 finished 8 cycles of FOLFOX. ?09/17/19 APR/posterior vaginectomy/TAH/BSO/VY-flap ?pT4b pN0 with close vaginal margin 0.2 mm.  Uterus and  ovaries negative for malignancy. ?Patient reports  bilateral lower extremity numbness and tingling, intermittent, left worse than right. ?She has lost a lot of weight since her APR surgery.  ? ?#Family history with half brother having's history of colon cancer prostate cancer.  Personal history of colorectal cancer.  Patient has not decided if she wants genetic testing.   ? ?# history of PE( 01/13/2020)  in the bilateral lower extremity DVT (01/13/2020).   ?She finishes 6 months of anticoagulation with Eliquis 5 mg twice daily. Now switched to Eliquis 2.5 mg twice daily.. ? ?# She has now developed recurrent disease. #06/30/20  vaginal introitus mass biopsied. Pathology is consistent with metastatic colorectal adenocarcinoma ?I have discussed with [name]  Dr. Hester Mates and the mass is not resectable. ?Patient has also had colonoscopy by Dr. Vicente Males yesterday. Normal examination. ?# 07/16/2020 cycle 1 FOLFIRI  ?# 07/20/2020 PET scan was done for further evaluation, images are consistent with local recurrence, no distant metastasis. ?#Discussed with radiation oncology Dr. Baruch Gouty will recommends concurrent chemotherapy and radiation. ?08/02/2020-08/16/2020, patient starts radiation.  Xeloda was held due to neutropenia ?08/17/2020,-09/06/2020 Xeloda 1500 mg twice daily concurrently with radiation ? ?01/31/21 started on FOLFIRI + Bev ?05/18/2021 CT chest abdomen pelvis showed ?Previously noted enlargement of bilateral inguinal lymph nodes is resolved, consistent with treatment response of nodal metastatic disease. Interval decrease in size of multiple small bilateral pulmonary nodules, consistent with treatment response of pulmonary metastatic disease. No evidence of new metastatic disease. ?05/24/2021 - 08/30/2021, continued on FOLFIRI plus bevacizumab.  Irinotecan dose was reduced, eventually 177m/m2 ? ?09/02/2021, CT chest abdomen pelvis without contrast ?Showed small bilateral pulmonary nodules, unchanged.  Stable metastatic  disease.  No noncontrast evidence of new metastatic disease in the chest abdomen pelvis.  Small parastomal hernia.  Enlargement of main pulmonary artery.  Coronary artery disease. ? ?09/13/2021, maintenance 5-FU/bevacizumab ?11/28/2021, 5-FU/Irinotecan/bevacizumab.  Irinotecan 100 mg/m2 was added back due to progressively increasing CEA. ? ?INTERVAL HISTORY ?KTANEKIA RYANSis a 76y.o. female who has above history reviewed by me presents for follow-up of rectal cancer. ?Currently on dose reduced FOLFIRI/bevacizumab  ?Patient reports that chronic diarrhea is manageable.  She takes Imodium/Lomotil as needed.  Cholestyramine ?Otherwise she has no new complaints. ? ? ? ?Review of Systems  ?Constitutional:  Positive for fatigue. Negative for appetite change, chills, fever and unexpected weight change.  ?HENT:   Negative for hearing loss and voice change.   ?Eyes:  Negative for eye problems.  ?Respiratory:  Negative for chest tightness and cough.   ?Cardiovascular:  Negative for chest pain.  ?Gastrointestinal:  Positive for diarrhea. Negative for abdominal distention, abdominal pain, blood in stool, constipation and nausea.  ?Endocrine: Negative for hot flashes.  ?Genitourinary:  Negative for difficulty urinating and frequency.   ?Musculoskeletal:  Negative for arthralgias.  ?Skin:  Negative for itching and rash.  ?Neurological:  Negative for extremity weakness and numbness.  ?Hematological:  Negative for adenopathy.  ?Psychiatric/Behavioral:  Negative for confusion.   ? ?MEDICAL HISTORY:  ?Past Medical History:  ?Diagnosis Date  ? Allergy   ? Arthritis   ? Blood clot in vein   ? Family history of colon cancer   ? GERD (gastroesophageal reflux disease)   ? Hypercholesteremia   ? Hypertension   ? Hypertension   ? Lower extremity edema   ? Personal history of chemotherapy   ? [place]al cancer [place] 12[place]  ? Urinary incontinence   ? ? ?SURGICAL HISTORY: ?Past Surgical History:  ?Procedure Laterality Date  ? ABDOMINAL  HYSTERECTOMY    ?  CHOLECYSTECTOMY  1971  ? COLONOSCOPY WITH PROPOFOL N/A 12/03/2018  ? Procedure: COLONOSCOPY WITH PROPOFOL;  Surgeon: Lucilla Lame, MD;  Location: [facility] END[facility]COPY;  Service: Endoscopy;  Laterality: N/A;

## 2022-02-07 LAB — CEA: CEA: 883 ng/mL — ABNORMAL HIGH (ref 0.0–4.7)

## 2022-02-08 ENCOUNTER — Inpatient Hospital Stay: Payer: Medicare Other

## 2022-02-08 VITALS — BP 119/63 | HR 65 | Resp 17

## 2022-02-08 DIAGNOSIS — C20 Malignant neoplasm of rectum: Secondary | ICD-10-CM

## 2022-02-08 DIAGNOSIS — K219 Gastro-esophageal reflux disease without esophagitis: Secondary | ICD-10-CM | POA: Diagnosis not present

## 2022-02-08 DIAGNOSIS — Z800 Family history of malignant neoplasm of digestive organs: Secondary | ICD-10-CM | POA: Diagnosis not present

## 2022-02-08 DIAGNOSIS — I10 Essential (primary) hypertension: Secondary | ICD-10-CM | POA: Diagnosis not present

## 2022-02-08 DIAGNOSIS — Z87891 Personal history of nicotine dependence: Secondary | ICD-10-CM | POA: Diagnosis not present

## 2022-02-08 DIAGNOSIS — C799 Secondary malignant neoplasm of unspecified site: Secondary | ICD-10-CM

## 2022-02-08 DIAGNOSIS — Z86718 Personal history of other venous thrombosis and embolism: Secondary | ICD-10-CM | POA: Diagnosis not present

## 2022-02-08 DIAGNOSIS — E7800 Pure hypercholesterolemia, unspecified: Secondary | ICD-10-CM | POA: Diagnosis not present

## 2022-02-08 DIAGNOSIS — Z5111 Encounter for antineoplastic chemotherapy: Secondary | ICD-10-CM | POA: Diagnosis not present

## 2022-02-08 DIAGNOSIS — Z5112 Encounter for antineoplastic immunotherapy: Secondary | ICD-10-CM | POA: Diagnosis not present

## 2022-02-08 DIAGNOSIS — Z86711 Personal history of pulmonary embolism: Secondary | ICD-10-CM | POA: Diagnosis not present

## 2022-02-08 DIAGNOSIS — Z79899 Other long term (current) drug therapy: Secondary | ICD-10-CM | POA: Diagnosis not present

## 2022-02-08 DIAGNOSIS — R609 Edema, unspecified: Secondary | ICD-10-CM | POA: Diagnosis not present

## 2022-02-08 DIAGNOSIS — E876 Hypokalemia: Secondary | ICD-10-CM | POA: Diagnosis not present

## 2022-02-08 DIAGNOSIS — I700 Atherosclerosis of aorta: Secondary | ICD-10-CM | POA: Diagnosis not present

## 2022-02-08 MED ORDER — SODIUM CHLORIDE 0.9% FLUSH
10.0000 mL | INTRAVENOUS | Status: DC | PRN
  Administered 2022-02-08: 10 mL
  Filled 2022-02-08: qty 10

## 2022-02-08 MED ORDER — HEPARIN SOD (PORK) LOCK FLUSH 100 UNIT/ML IV SOLN
500.0000 [IU] | Freq: Once | INTRAVENOUS | Status: AC | PRN
  Administered 2022-02-08: 500 [IU]
  Filled 2022-02-08: qty 5

## 2022-02-08 MED ORDER — PEGFILGRASTIM-BMEZ 6 MG/0.6ML ~~LOC~~ SOSY
6.0000 mg | PREFILLED_SYRINGE | Freq: Once | SUBCUTANEOUS | Status: AC
  Administered 2022-02-08: 6 mg via SUBCUTANEOUS
  Filled 2022-02-08: qty 0.6

## 2022-02-08 NOTE — Progress Notes (Signed)
Patient chemo pump d/c'd today, no concerns voiced.   Ziexetnzo given.Stable at discharge. Refused AVS .    ?

## 2022-02-10 ENCOUNTER — Other Ambulatory Visit: Payer: Self-pay | Admitting: Oncology

## 2022-02-10 DIAGNOSIS — I825Z3 Chronic embolism and thrombosis of unspecified deep veins of distal lower extremity, bilateral: Secondary | ICD-10-CM

## 2022-02-10 DIAGNOSIS — Z7901 Long term (current) use of anticoagulants: Secondary | ICD-10-CM

## 2022-02-10 DIAGNOSIS — E876 Hypokalemia: Secondary | ICD-10-CM

## 2022-02-10 NOTE — Telephone Encounter (Signed)
Component Ref Range & Units 4 d ago ?(02/06/22) 2 wk ago ?(01/23/22) 1 mo ago ?(01/09/22) 1 mo ago ?(12/26/21) 2 mo ago ?(12/12/21) 2 mo ago ?(11/28/21) 3 mo ago ?(11/09/21)  ?Potassium 3.5 - 5.1 mmol/L 4.3  4.0  4.0  4.1  4.0  4.1  4.1   ? ?

## 2022-02-13 ENCOUNTER — Other Ambulatory Visit: Payer: Self-pay

## 2022-02-13 ENCOUNTER — Encounter: Payer: Self-pay | Admitting: Oncology

## 2022-02-13 ENCOUNTER — Inpatient Hospital Stay: Payer: Medicare Other

## 2022-02-13 DIAGNOSIS — E876 Hypokalemia: Secondary | ICD-10-CM

## 2022-02-13 MED ORDER — POTASSIUM CHLORIDE CRYS ER 20 MEQ PO TBCR: 40.0000 meq | EXTENDED_RELEASE_TABLET | Freq: Two times a day (BID) | ORAL | 1 refills | Status: DC

## 2022-02-20 ENCOUNTER — Inpatient Hospital Stay: Payer: Medicare Other

## 2022-02-20 ENCOUNTER — Ambulatory Visit: Payer: Medicare Other | Admitting: Radiation Oncology

## 2022-02-20 ENCOUNTER — Inpatient Hospital Stay (HOSPITAL_BASED_OUTPATIENT_CLINIC_OR_DEPARTMENT_OTHER): Payer: Medicare Other | Admitting: Oncology

## 2022-02-20 ENCOUNTER — Encounter: Payer: Self-pay | Admitting: Oncology

## 2022-02-20 ENCOUNTER — Inpatient Hospital Stay: Payer: Medicare Other | Attending: Oncology

## 2022-02-20 VITALS — BP 125/71 | HR 69 | Temp 96.3°F | Wt 158.5 lb

## 2022-02-20 DIAGNOSIS — Z86711 Personal history of pulmonary embolism: Secondary | ICD-10-CM

## 2022-02-20 DIAGNOSIS — D702 Other drug-induced agranulocytosis: Secondary | ICD-10-CM | POA: Diagnosis not present

## 2022-02-20 DIAGNOSIS — E7800 Pure hypercholesterolemia, unspecified: Secondary | ICD-10-CM | POA: Insufficient documentation

## 2022-02-20 DIAGNOSIS — Z87891 Personal history of nicotine dependence: Secondary | ICD-10-CM | POA: Diagnosis not present

## 2022-02-20 DIAGNOSIS — D701 Agranulocytosis secondary to cancer chemotherapy: Secondary | ICD-10-CM

## 2022-02-20 DIAGNOSIS — C799 Secondary malignant neoplasm of unspecified site: Secondary | ICD-10-CM

## 2022-02-20 DIAGNOSIS — Z5189 Encounter for other specified aftercare: Secondary | ICD-10-CM | POA: Diagnosis not present

## 2022-02-20 DIAGNOSIS — K5900 Constipation, unspecified: Secondary | ICD-10-CM | POA: Diagnosis not present

## 2022-02-20 DIAGNOSIS — Z9221 Personal history of antineoplastic chemotherapy: Secondary | ICD-10-CM | POA: Insufficient documentation

## 2022-02-20 DIAGNOSIS — R918 Other nonspecific abnormal finding of lung field: Secondary | ICD-10-CM | POA: Insufficient documentation

## 2022-02-20 DIAGNOSIS — T451X5A Adverse effect of antineoplastic and immunosuppressive drugs, initial encounter: Secondary | ICD-10-CM | POA: Insufficient documentation

## 2022-02-20 DIAGNOSIS — Z5111 Encounter for antineoplastic chemotherapy: Secondary | ICD-10-CM

## 2022-02-20 DIAGNOSIS — Z86718 Personal history of other venous thrombosis and embolism: Secondary | ICD-10-CM | POA: Insufficient documentation

## 2022-02-20 DIAGNOSIS — R609 Edema, unspecified: Secondary | ICD-10-CM | POA: Insufficient documentation

## 2022-02-20 DIAGNOSIS — R634 Abnormal weight loss: Secondary | ICD-10-CM | POA: Diagnosis not present

## 2022-02-20 DIAGNOSIS — Z79899 Other long term (current) drug therapy: Secondary | ICD-10-CM | POA: Diagnosis not present

## 2022-02-20 DIAGNOSIS — E876 Hypokalemia: Secondary | ICD-10-CM | POA: Diagnosis not present

## 2022-02-20 DIAGNOSIS — K521 Toxic gastroenteritis and colitis: Secondary | ICD-10-CM

## 2022-02-20 DIAGNOSIS — R197 Diarrhea, unspecified: Secondary | ICD-10-CM | POA: Diagnosis not present

## 2022-02-20 DIAGNOSIS — I10 Essential (primary) hypertension: Secondary | ICD-10-CM | POA: Diagnosis not present

## 2022-02-20 DIAGNOSIS — C20 Malignant neoplasm of rectum: Secondary | ICD-10-CM | POA: Insufficient documentation

## 2022-02-20 DIAGNOSIS — K219 Gastro-esophageal reflux disease without esophagitis: Secondary | ICD-10-CM | POA: Diagnosis not present

## 2022-02-20 LAB — CBC WITH DIFFERENTIAL/PLATELET
Abs Immature Granulocytes: 0.09 10*3/uL — ABNORMAL HIGH (ref 0.00–0.07)
Basophils Absolute: 0 10*3/uL (ref 0.0–0.1)
Basophils Relative: 1 %
Eosinophils Absolute: 0.1 10*3/uL (ref 0.0–0.5)
Eosinophils Relative: 2 %
HCT: 37.7 % (ref 36.0–46.0)
Hemoglobin: 12 g/dL (ref 12.0–15.0)
Immature Granulocytes: 2 %
Lymphocytes Relative: 21 %
Lymphs Abs: 1.3 10*3/uL (ref 0.7–4.0)
MCH: 30.6 pg (ref 26.0–34.0)
MCHC: 31.8 g/dL (ref 30.0–36.0)
MCV: 96.2 fL (ref 80.0–100.0)
Monocytes Absolute: 0.6 10*3/uL (ref 0.1–1.0)
Monocytes Relative: 9 %
Neutro Abs: 4.1 10*3/uL (ref 1.7–7.7)
Neutrophils Relative %: 65 %
Platelets: 204 10*3/uL (ref 150–400)
RBC: 3.92 MIL/uL (ref 3.87–5.11)
RDW: 17.5 % — ABNORMAL HIGH (ref 11.5–15.5)
WBC: 6.2 10*3/uL (ref 4.0–10.5)
nRBC: 0 % (ref 0.0–0.2)

## 2022-02-20 LAB — COMPREHENSIVE METABOLIC PANEL
ALT: 13 U/L (ref 0–44)
AST: 26 U/L (ref 15–41)
Albumin: 4.1 g/dL (ref 3.5–5.0)
Alkaline Phosphatase: 71 U/L (ref 38–126)
Anion gap: 5 (ref 5–15)
BUN: 13 mg/dL (ref 8–23)
CO2: 29 mmol/L (ref 22–32)
Calcium: 9.3 mg/dL (ref 8.9–10.3)
Chloride: 103 mmol/L (ref 98–111)
Creatinine, Ser: 0.96 mg/dL (ref 0.44–1.00)
GFR, Estimated: >60 mL/min (ref >60)
Glucose, Bld: 102 mg/dL — ABNORMAL HIGH (ref 70–99)
Potassium: 4.1 mmol/L (ref 3.5–5.1)
Sodium: 137 mmol/L (ref 135–145)
Total Bilirubin: 0.4 mg/dL (ref 0.3–1.2)
Total Protein: 6.9 g/dL (ref 6.5–8.1)

## 2022-02-20 LAB — PROTEIN, URINE, RANDOM: Total Protein, Urine: 15 mg/dL

## 2022-02-20 MED ORDER — SODIUM CHLORIDE 0.9 % IV SOLN
350.0000 mg | Freq: Once | INTRAVENOUS | Status: AC
  Administered 2022-02-20: 350 mg via INTRAVENOUS
  Filled 2022-02-20: qty 14

## 2022-02-20 MED ORDER — PALONOSETRON HCL INJECTION 0.25 MG/5ML
0.2500 mg | Freq: Once | INTRAVENOUS | Status: AC
  Administered 2022-02-20: 0.25 mg via INTRAVENOUS
  Filled 2022-02-20: qty 5

## 2022-02-20 MED ORDER — SODIUM CHLORIDE 0.9 % IV SOLN
2400.0000 mg/m2 | INTRAVENOUS | Status: DC
  Administered 2022-02-20: 4300 mg via INTRAVENOUS
  Filled 2022-02-20: qty 86

## 2022-02-20 MED ORDER — SODIUM CHLORIDE 0.9 % IV SOLN
10.0000 mg | Freq: Once | INTRAVENOUS | Status: AC
  Administered 2022-02-20: 10 mg via INTRAVENOUS
  Filled 2022-02-20: qty 10

## 2022-02-20 MED ORDER — DIPHENHYDRAMINE HCL 50 MG/ML IJ SOLN
25.0000 mg | Freq: Every day | INTRAMUSCULAR | Status: DC | PRN
  Administered 2022-02-20: 25 mg via INTRAVENOUS
  Filled 2022-02-20 (×2): qty 1

## 2022-02-20 MED ORDER — SODIUM CHLORIDE 0.9 % IV SOLN
Freq: Once | INTRAVENOUS | Status: AC
  Filled 2022-02-20: qty 250

## 2022-02-20 MED ORDER — SODIUM CHLORIDE 0.9 % IV SOLN
750.0000 mg | Freq: Once | INTRAVENOUS | Status: AC
  Administered 2022-02-20: 750 mg via INTRAVENOUS
  Filled 2022-02-20: qty 37.5

## 2022-02-20 MED ORDER — SODIUM CHLORIDE 0.9 % IV SOLN
100.0000 mg/m2 | Freq: Once | INTRAVENOUS | Status: AC
  Administered 2022-02-20: 180 mg via INTRAVENOUS
  Filled 2022-02-20: qty 5

## 2022-02-20 MED ORDER — ATROPINE SULFATE 1 MG/ML IV SOLN
0.4000 mg | Freq: Once | INTRAVENOUS | Status: AC
  Administered 2022-02-20: 0.4 mg via INTRAVENOUS
  Filled 2022-02-20: qty 1

## 2022-02-20 NOTE — Patient Instructions (Signed)
[facility] CANCER CTR AT [place]  Discharge Instructions: ?Thank you for choosing [facility] to provide your oncology and hematology care.  ?If you have a lab appointment with the [place], please go directly to the [facility] and check in at the registration area. ? ?Wear comfortable clothing and clothing appropriate for easy access to any Portacath or PICC line.  ? ?We strive to give you quality time with your provider. You may need to reschedule your appointment if you arrive late (15 or more minutes).  Arriving late affects you and other patients whose appointments are after yours.  Also, if you miss three or more appointments without notifying the office, you may be dismissed from the clinic at the provider?s discretion.    ?  ?For prescription refill requests, have your pharmacy contact our office and allow 72 hours for refills to be completed.   ? ?Today you received the following chemotherapy and/or immunotherapy agents Mvasi, Irinotecan, Leucovorin and Adrucil.  ?    ?  ?To help prevent nausea and vomiting after your treatment, we encourage you to take your nausea medication as directed. ? ?BELOW ARE SYMPTOMS THAT SHOULD BE REPORTED IMMEDIATELY: ?*FEVER GREATER THAN 100.4 F (38 ?C) OR HIGHER ?*CHILLS OR SWEATING ?*NAUSEA AND VOMITING THAT IS NOT CONTROLLED WITH YOUR NAUSEA MEDICATION ?*UNUSUAL SHORTNESS OF BREATH ?*UNUSUAL BRUISING OR BLEEDING ?*URINARY PROBLEMS (pain or burning when urinating, or frequent urination) ?*BOWEL PROBLEMS (unusual diarrhea, constipation, pain near the anus) ?TENDERNESS IN MOUTH AND THROAT WITH OR WITHOUT PRESENCE OF ULCERS (sore throat, sores in mouth, or a toothache) ?UNUSUAL RASH, SWELLING OR PAIN  ?UNUSUAL VAGINAL DISCHARGE OR ITCHING  ? ?Items with * indicate a potential emergency and should be followed up as soon as possible or go to the Emergency Department if any problems should occur. ? ?Please show the CHEMOTHERAPY ALERT CARD or  IMMUNOTHERAPY ALERT CARD at check-in to the Emergency Department and triage nurse. ? ?Should you have questions after your visit or need to cancel or reschedule your appointment, please contact [facility] CANCER [place] AT [place]  [phone] and follow the prompts.  Office hours are 8:00 a.m. to 4:30 p.m. Monday - Friday. Please note that voicemails left after 4:00 p.m. may not be returned until the following business day.  We are closed weekends and major holidays. You have access to a nurse at all times for urgent questions. Please call the main number to the clinic [phone] and follow the prompts. ? ?For any non-urgent questions, you may also contact your provider using MyChart. We now offer e-Visits for anyone 75 and older to request care online for non-urgent symptoms. For details visit [email or web address]. ?  ?Also download the MyChart app! Go to the app store, search "MyChart", open the app, select [place], and log in with your MyChart username and password. ? ?Due to Covid, a mask is required upon entering the hospital/clinic. If you do not have a mask, one will be given to you upon arrival. For doctor visits, patients may have 1 support person aged 32 or older with them. For treatment visits, patients cannot have anyone with them due to current Covid guidelines and our immunocompromised population.  ?

## 2022-02-20 NOTE — Progress Notes (Signed)
Hematology/Oncology Progress note [phone] [phone]      Patient Care Team: Smitty Cords, DO as PCP - General (Family Medicine) Benita Gutter, RN as Registered Nurse [name], MD as Consulting Physician (Hematology and Oncology)  CHIEF COMPLAINTS/REASON FOR VISIT:  Follow up for rectal cancer  HISTORY OF PRESENTING ILLNESS:  Patient initially presented with complaints of postmenopausal bleeding on 08/16/2018.  History of was menopausal vaginal bleeding in 2016 which resulted in cervical polypectomy.  Pathology 02/04/2015 showed cervical polyp, consistent with benign endometrial polyp.  [name] after polypectomy due to anxiety associated with pelvic exams.  pelvic exam on 08/16/2018 reviewed cervical abnormality and from enlarged uterus. Seen by Dr. [name] on 10/29/2018.  Endometrial biopsy and a Pap smear was performed. 10/29/2018 Pap smear showed adenocarcinoma, favor endometrial origin. 10/29/2018 endometrial biopsy showed endometrioid carcinoma, FIGO grade 1.  10/29/2018- TA & TV Ultrasound revealed: Anteverted uterus measuring 8.7 x 5.6 x 6.4 cm without evidence of focal masses.  The endometrium measuring 24.1 mm (thickened) and heterogeneous.  Right and left ovaries not visualized.  No adnexal masses identified.  No free fluid in cul-de-sac.  Patient was seen by Dr. [name] in clinic on 11/13/2018.  Cervical exam reveals 2 cm exophytic irregular mass consistent with malignancy.   11/19/2018 CT chest abdomen pelvis with contrast showed thickened endometrium with some irregularity compatible with the provided diagnosis of endometrial malignancy.  There is a mildly prominent left inguinal node 1.4 cm.  Patient was seen by Dr. [name] on 11/20/2018 and left groin lymph node biopsy was recommended.  11/26/2018 patient underwent left inguinal lymph node biopsy. Pathology showed metastatic adenocarcinoma consistent with colorectal origin.  CDX  2+.  Case was discussed on tumor board.  Recommend colonoscopy for further evaluation.  Patient reports significant weight loss 30 pounds over the last year.  Chronic vaginal spotting. Change of bowel habits the past few months.  More constipated.  Family history positive for brother who has colon cancer prostate cancer.  patient has underwent colonoscopy on 12/03/2018 which reviewed a nonobstructing large mass in the rectum.  Also chronic fistula.  Mass was not circumferential.  This was biopsied with a cold forceps for histology.  Pathology came back hyperplastic polyp negative for dysplasia and malignancy. [name], patient underwent flex sigmoidoscopy on 12/06/2018 with rebiopsy of the rectal mass. This time biopsy results came back positive for invasive colorectal adenocarcinoma, moderately differentiated. Immunotherapy for nearly mismatch repair protein (MMR ) was performed.  There is no loss of MMR expression.  low probability of MSI high.   # Seen by [name] for evaluation of resectability for rectal cancer. In addition, she also had a second opinion with [facility] where her endometrial biopsy pathology was changed to  adenocarcinoma, consistent with colorectal primary.   [name]. She has home health that has been assisting with ostomy care  Patient was also evaluated by [facility].  Recommendation is to proceed with TNT with concurrent chemoradiation followed by neoadjuvant chemotherapy followed by surgical resection. Patient prefers to have treatment done locally with [facility].   # Oncology Treatment:  02/03/2019- 03/19/2019  concurrent Xeloda and radiation.  Xeloda dose 825mg  /m2 BID - rounded to 1650mg  BID- on days of radiation. 04/09/2019, started on FOLFOX with bolus early.  Omitted.  07/16/2019 finished 8 cycles of FOLFOX. 09/17/19 APR/posterior vaginectomy/TAH/BSO/VY-flap pT4b pN0 with close vaginal margin 0.2  mm.  Uterus and ovaries negative  for malignancy. Patient reports bilateral lower extremity numbness and tingling, intermittent, left worse than right. She has lost a lot of weight since her APR surgery.   #Family history with half brother having's history of colon cancer prostate cancer.  Personal history of colorectal cancer.  Patient has not decided if she wants genetic testing.    # history of PE( 01/13/2020)  in the bilateral lower extremity DVT (01/13/2020).   She finishes 6 months of anticoagulation with Eliquis 5 mg twice daily. Now switched to Eliquis 2.5 mg twice daily..  # She has now developed recurrent disease. #06/30/20  vaginal introitus mass biopsied. Pathology is consistent with metastatic colorectal adenocarcinoma I have discussed with [name]  Dr. Luciano Cutter and the mass is not resectable. Patient has also had colonoscopy by Dr. Tobi Bastos yesterday. Normal examination. # 07/16/2020 cycle 1 FOLFIRI  # 07/20/2020 PET scan was done for further evaluation, images are consistent with local recurrence, no distant metastasis. #Discussed with radiation oncology Dr. Rushie Chestnut will recommends concurrent chemotherapy and radiation. 08/02/2020-08/16/2020, patient starts radiation.  Xeloda was held due to neutropenia 08/17/2020,-09/06/2020 Xeloda 1500 mg twice daily concurrently with radiation  01/31/21 started on FOLFIRI + Bev 05/18/2021 CT chest abdomen pelvis showed Previously noted enlargement of bilateral inguinal lymph nodes is resolved, consistent with treatment response of nodal metastatic disease. Interval decrease in size of multiple small bilateral pulmonary nodules, consistent with treatment response of pulmonary metastatic disease. No evidence of new metastatic disease. 05/24/2021 - 08/30/2021, continued on FOLFIRI plus bevacizumab.  Irinotecan dose was reduced, eventually 100mg /m2  09/02/2021, CT chest abdomen pelvis without contrast Showed small bilateral pulmonary nodules, unchanged.   Stable metastatic disease.  No noncontrast evidence of new metastatic disease in the chest abdomen pelvis.  Small parastomal hernia.  Enlargement of main pulmonary artery.  Coronary artery disease.  09/13/2021, maintenance 5-FU/bevacizumab 11/28/2021, 5-FU/Irinotecan/bevacizumab.  Irinotecan 100 mg/m2 was added back due to progressively increasing CEA.  INTERVAL HISTORY Jacqueline Yoder is a 76 y.o. female who has above history reviewed by me presents for follow-up of rectal cancer. Currently on dose reduced FOLFIRI/bevacizumab  Patient reports that chronic diarrhea is manageable.  She takes Imodium/Lomotil as needed.  Also on cholestyramine She was accompanied by her daughter today.  She has no new complaints.  Denies any abdominal pain, nausea vomiting.  She changed her colostomy bag every other day.    Review of Systems  Constitutional:  Positive for fatigue. Negative for appetite change, chills, fever and unexpected weight change.  HENT:   Negative for hearing loss and voice change.   Eyes:  Negative for eye problems.  Respiratory:  Negative for chest tightness and cough.   Cardiovascular:  Negative for chest pain.  Gastrointestinal:  Negative for abdominal distention, abdominal pain, blood in stool, constipation, diarrhea and nausea.  Endocrine: Negative for hot flashes.  Genitourinary:  Negative for difficulty urinating and frequency.   Musculoskeletal:  Negative for arthralgias.  Skin:  Negative for itching and rash.  Neurological:  Negative for extremity weakness and numbness.  Hematological:  Negative for adenopathy.  Psychiatric/Behavioral:  Negative for confusion.    MEDICAL HISTORY:  Past Medical History:  Diagnosis Date   Allergy    Arthritis    Blood clot in vein    Family history of colon cancer    GERD (gastroesophageal reflux disease)    Hypercholesteremia    Hypertension    Hypertension    Lower extremity edema    Personal history of chemotherapy  Rectal  cancer (HCC) 12/2018   Urinary incontinence     SURGICAL HISTORY: Past Surgical History:  Procedure Laterality Date   ABDOMINAL HYSTERECTOMY     CHOLECYSTECTOMY  1971   COLONOSCOPY WITH PROPOFOL N/A 12/03/2018   Procedure: COLONOSCOPY WITH PROPOFOL;  Surgeon: Midge Minium, MD;  Location: [facility] ENDOSCOPY;  Service: Endoscopy;  Laterality: N/A;   COLONOSCOPY WITH PROPOFOL N/A 07/15/2020   Procedure: COLONOSCOPY WITH PROPOFOL;  Surgeon: Wyline Mood, MD;  Location: [facility] ENDOSCOPY;  Service: Gastroenterology;  Laterality: N/A;   FLEXIBLE SIGMOIDOSCOPY N/A 12/06/2018   Procedure: FLEXIBLE SIGMOIDOSCOPY;  Surgeon: Wyline Mood, MD;  Location: [facility] ENDOSCOPY;  Service: Endoscopy;  Laterality: N/A;   LAPAROSCOPIC COLOSTOMY  01/06/2019   PORTACATH PLACEMENT N/A 04/03/2019   Procedure: INSERTION PORT-A-CATH;  Surgeon: Leafy Ro, MD;  Location: [facility] ORS;  Service: General;  Laterality: N/A;    SOCIAL HISTORY: Social History   Socioeconomic History   Marital status: Widowed    Spouse name: Not on file   Number of children: Not on file   Years of education: Not on file   Highest education level: Not on file  Occupational History   Not on file  Tobacco Use   Smoking status: Former    Types: Cigarettes    Quit date: 12/02/1977    Years since quitting: 44.2   Smokeless tobacco: Former  Building services engineer Use: Never used  Substance and Sexual Activity   Alcohol use: Never   Drug use: Never   Sexual activity: Not Currently    Birth control/protection: None  Other Topics Concern   Not on file  Social History Narrative   Lives with daughter   Social Determinants of Health   Financial Resource Strain: Low Risk    Difficulty of Paying Living Expenses: Not hard at all  Food Insecurity: No Food Insecurity   Worried About Programme researcher, broadcasting/film/video in the Last Year: Never true   Ran Out of Food in the Last Year: Never true  Transportation Needs: No Transportation Needs   Lack of Transportation  (Medical): No   Lack of Transportation (Non-Medical): No  Physical Activity: Inactive   Days of Exercise per Week: 0 days   Minutes of Exercise per Session: 0 min  Stress: No Stress Concern Present   Feeling of Stress : Not at all  Social Connections: Not on file  Intimate Partner Violence: Not on file    FAMILY HISTORY: Family History  Problem Relation Age of Onset   Colon cancer Brother 31       exposure to chemicals [place]   Hypertension Mother    Stroke Mother    Kidney failure Father    Breast cancer Neg Hx    Ovarian cancer Neg Hx     ALLERGIES:  is allergic to sulfamethoxazole-trimethoprim.  MEDICATIONS:  Current Outpatient Medications  Medication Sig Dispense Refill   Cholecalciferol (VITAMIN D3) 2000 units capsule Take 2,000 Units by mouth daily.     cholestyramine (QUESTRAN) 4 g packet Take 1 packet (4 g total) by mouth 3 (three) times daily. 90 each 1   diclofenac sodium (VOLTAREN) 1 % GEL Apply 2 g topically 4 (four) times daily as needed (joint pain).  11   ELIQUIS 2.5 MG TABS tablet Take 1 tablet by mouth twice daily 60 tablet 0   fluticasone (FLONASE) 50 MCG/ACT nasal spray USE 1 SPRAY IN EACH NOSTRIL ONCE DAILY 16 g 3   gabapentin (NEURONTIN) 100 MG capsule Take  1 capsule (100 mg total) by mouth at bedtime. 30 capsule 0   ipratropium (ATROVENT) 0.03 % nasal spray Place 1 spray into both nostrils 2 (two) times daily. 30 mL 2   lidocaine-prilocaine (EMLA) cream Apply 1 application. topically as needed. 30 g 6   loperamide (IMODIUM) 2 MG capsule Take 1 capsule (2 mg total) by mouth See admin instructions. With onset of loose stool, take 4mg  followed by 2mg  every 2 hours,  Maximum: 16 mg/day 120 capsule 1   loratadine (CLARITIN) 10 MG tablet Take 10 mg by mouth daily.     Multiple Vitamins-Minerals (ONE-A-DAY WOMENS 50 PLUS PO) Take 1 tablet by mouth daily.      ondansetron (ZOFRAN) 8 MG tablet Take 1 tablet (8 mg total) by mouth 2 (two) times daily as needed for  refractory nausea / vomiting. Start on day 3 after chemotherapy. 30 tablet 1   potassium chloride SA (KLOR-CON M) 20 MEQ tablet Take 2 tablets (40 mEq total) by mouth 2 (two) times daily. 120 tablet 1   prochlorperazine (COMPAZINE) 10 MG tablet Take 1 tablet (10 mg total) by mouth every 6 (six) hours as needed (NAUSEA). 30 tablet 1   simvastatin (ZOCOR) 40 MG tablet Take 1 tablet (40 mg total) by mouth at bedtime. 90 tablet 3   triamterene-hydrochlorothiazide (DYAZIDE) 37.5-25 MG capsule Take 1 each (1 capsule total) by mouth daily. 90 capsule 3   zinc gluconate 50 MG tablet Take 50 mg by mouth daily.     diphenoxylate-atropine (LOMOTIL) 2.5-0.025 MG tablet Take 1 tablet by mouth 4 (four) times daily as needed for diarrhea or loose stools. (Patient not taking: Reported on 09/27/2021) 30 tablet 1   No current facility-administered medications for this visit.   Facility-Administered Medications Ordered in Other Visits  Medication Dose Route Frequency Provider Last Rate Last Admin   diphenhydrAMINE (BENADRYL) injection 25 mg  25 mg Intravenous Daily PRN [name], MD   25 mg at 02/20/22 1201   fluorouracil (ADRUCIL) 4,300 mg in sodium chloride 0.9 % 64 mL chemo infusion  2,400 mg/m2 (Order-Specific) Intravenous 1 day or 1 dose [name], MD   4,300 mg at 02/20/22 1228     PHYSICAL EXAMINATION: ECOG PERFORMANCE STATUS: 1 - Symptomatic but completely ambulatory  Physical Exam Constitutional:      General: She is not in acute distress. HENT:     Head: Normocephalic and atraumatic.  Eyes:     General: No scleral icterus. Cardiovascular:     Rate and Rhythm: Normal rate and regular rhythm.     Heart sounds: Normal heart sounds.  Pulmonary:     Effort: Pulmonary effort is normal. No respiratory distress.     Breath sounds: No wheezing.  Abdominal:     General: Bowel sounds are normal. There is no distension.     Palpations: Abdomen is soft.     Comments: + Colostomy bag   Musculoskeletal:         General: No deformity. Normal range of motion.     Cervical back: Normal range of motion and neck supple.  Skin:    General: Skin is warm and dry.     Findings: No erythema or rash.  Neurological:     Mental Status: She is alert and oriented to person, place, and time. Mental status is at baseline.     Cranial Nerves: No cranial nerve deficit.     Coordination: Coordination normal.      LABORATORY DATA:  I  have reviewed the data as listed Lab Results  Component Value Date   WBC 6.2 02/20/2022   HGB 12.0 02/20/2022   HCT 37.7 02/20/2022   MCV 96.2 02/20/2022   PLT 204 02/20/2022   Recent Labs    01/23/22 0857 02/06/22 0833 02/20/22 0804  NA 137 139 137  K 4.0 4.3 4.1  CL 102 106 103  CO2 27 26 29   GLUCOSE 91 96 102*  BUN 15 14 13   CREATININE 0.91 0.89 0.96  CALCIUM 9.5 9.5 9.3  GFRNONAA >60 >60 >60  PROT 6.8 6.5 6.9  ALBUMIN 4.0 3.9 4.1  AST 28 26 26   ALT 18 15 13   ALKPHOS 72 73 71  BILITOT 0.8 0.2* 0.4    Iron/TIBC/Ferritin/ %Sat    Component Value Date/Time   IRON 40 06/23/2020 1124   TIBC 246 (L) 06/23/2020 1124   FERRITIN 338 (H) 06/23/2020 1124   IRONPCTSAT 16 06/23/2020 1124     RADIOGRAPHIC STUDIES: I have personally reviewed the radiological images as listed and agreed with the findings in the report. CT CHEST ABDOMEN PELVIS W CONTRAST  Result Date: 01/20/2022 CLINICAL DATA:  Restaging rectal cancer.  Ongoing chemotherapy. * Tracking Code: BO * EXAM: CT CHEST, ABDOMEN, AND PELVIS WITH CONTRAST TECHNIQUE: Multidetector CT imaging of the chest, abdomen and pelvis was performed following the standard protocol during bolus administration of intravenous contrast. RADIATION DOSE REDUCTION: This exam was performed according to the departmental dose-optimization program which includes automated exposure control, adjustment of the mA and/or kV according to patient size and/or use of iterative reconstruction technique. CONTRAST:  80mL OMNIPAQUE IOHEXOL 300  MG/ML  SOLN COMPARISON:  Multiple exams, including 11/01/2021 FINDINGS: CT CHEST FINDINGS Cardiovascular: Right Port-A-Cath tip: Cavoatrial junction. Mildly prominent main pulmonary artery. Mediastinum/Nodes: Small left supraclavicular lymph nodes measuring up to 0.5 cm in short axis on image 8 series 2, stable. No current pathologic adenopathy in the chest. Lungs/Pleura: Scattered stable bilateral pulmonary nodules. An index 4 by 3 mm left upper lobe nodule previously measured 4 by 3 mm when measured in the same fashion. An index 6 by 4 by 6 mm right middle lobe pulmonary nodule is stable. No new pulmonary nodules are identified. Stable mild scarring posteriorly in the right lower lobe and inferiorly in the right middle lobe. Musculoskeletal: Unremarkable CT ABDOMEN PELVIS FINDINGS Hepatobiliary: Gallbladder absent. Mild but improved biliary dilatation, common bile duct currently 0.7 cm on image 61 series 2, formerly 0.9 cm at this location. No focal hepatic abnormality observed. Pancreas: Borderline dorsal pancreatic duct dilatation, improved from prior. Spleen: Unremarkable Adrenals/Urinary Tract: No change in bilateral renal cysts. Some of the hypodense renal lesions are too small to characterize. In general, such lesions are not felt to warrant further workup unless there is a specific renal clinical concern. Adrenal glands unremarkable. Stomach/Bowel: Prior APR with left colostomy. The wall thickening in segments of the large bowel is probably attributable to nondistention. Small amount of fluid signal along the fatty tissues at the ostomy site, as before. Pelvic floor laxity with bulging small bowel loops posteriorly along the pelvic floor. Vascular/Lymphatic: Atherosclerosis is present, including aortoiliac atherosclerotic disease. No pathologic adenopathy. Reproductive: Prior hysterectomy.  Adnexa unremarkable. Other: No supplemental non-categorized findings. Musculoskeletal: Lumbar spondylosis and  degenerative disc disease. IMPRESSION: 1. Stable scattered small pulmonary nodules. 2. The biliary dilatation shown on the prior exam is mildly improved. 3. Prior APR with left colostomy. As before there is a small amount of fluid density in the peristomal  adipose tissues. 4.  Aortic Atherosclerosis (ICD10-I70.0). 5. Pelvic floor laxity with bulging of small bowel loops along the pelvic floor. 6. Mildly prominent main pulmonary artery may indicate pulmonary arterial hypertension. Electronically Signed   By: Gaylyn Rong M.D.   On: 01/20/2022 10:32   MM 3D SCREEN BREAST BILATERAL  Result Date: 01/16/2022 CLINICAL DATA:  Screening. EXAM: DIGITAL SCREENING BILATERAL MAMMOGRAM WITH TOMOSYNTHESIS AND CAD TECHNIQUE: Bilateral screening digital craniocaudal and mediolateral oblique mammograms were obtained. Bilateral screening digital breast tomosynthesis was performed. The images were evaluated with computer-aided detection. COMPARISON:  Previous exam(s). ACR Breast Density Category b: There are scattered areas of fibroglandular density. FINDINGS: There are no findings suspicious for malignancy. IMPRESSION: No mammographic evidence of malignancy. A result letter of this screening mammogram will be mailed directly to the patient. RECOMMENDATION: Screening mammogram in one year. (Code:SM-B-01Y) BI-RADS CATEGORY  1: Negative. Electronically Signed   By: Annia Belt M.D.   On: 01/16/2022 14:16      CT CHEST ABDOMEN PELVIS W CONTRAST  Result Date: 01/20/2022 CLINICAL DATA:  Restaging rectal cancer.  Ongoing chemotherapy. * Tracking Code: BO * EXAM: CT CHEST, ABDOMEN, AND PELVIS WITH CONTRAST TECHNIQUE: Multidetector CT imaging of the chest, abdomen and pelvis was performed following the standard protocol during bolus administration of intravenous contrast. RADIATION DOSE REDUCTION: This exam was performed according to the departmental dose-optimization program which includes automated exposure control, adjustment  of the mA and/or kV according to patient size and/or use of iterative reconstruction technique. CONTRAST:  80mL OMNIPAQUE IOHEXOL 300 MG/ML  SOLN COMPARISON:  Multiple exams, including 11/01/2021 FINDINGS: CT CHEST FINDINGS Cardiovascular: Right Port-A-Cath tip: Cavoatrial junction. Mildly prominent main pulmonary artery. Mediastinum/Nodes: Small left supraclavicular lymph nodes measuring up to 0.5 cm in short axis on image 8 series 2, stable. No current pathologic adenopathy in the chest. Lungs/Pleura: Scattered stable bilateral pulmonary nodules. An index 4 by 3 mm left upper lobe nodule previously measured 4 by 3 mm when measured in the same fashion. An index 6 by 4 by 6 mm right middle lobe pulmonary nodule is stable. No new pulmonary nodules are identified. Stable mild scarring posteriorly in the right lower lobe and inferiorly in the right middle lobe. Musculoskeletal: Unremarkable CT ABDOMEN PELVIS FINDINGS Hepatobiliary: Gallbladder absent. Mild but improved biliary dilatation, common bile duct currently 0.7 cm on image 61 series 2, formerly 0.9 cm at this location. No focal hepatic abnormality observed. Pancreas: Borderline dorsal pancreatic duct dilatation, improved from prior. Spleen: Unremarkable Adrenals/Urinary Tract: No change in bilateral renal cysts. Some of the hypodense renal lesions are too small to characterize. In general, such lesions are not felt to warrant further workup unless there is a specific renal clinical concern. Adrenal glands unremarkable. Stomach/Bowel: Prior APR with left colostomy. The wall thickening in segments of the large bowel is probably attributable to nondistention. Small amount of fluid signal along the fatty tissues at the ostomy site, as before. Pelvic floor laxity with bulging small bowel loops posteriorly along the pelvic floor. Vascular/Lymphatic: Atherosclerosis is present, including aortoiliac atherosclerotic disease. No pathologic adenopathy. Reproductive: Prior  hysterectomy.  Adnexa unremarkable. Other: No supplemental non-categorized findings. Musculoskeletal: Lumbar spondylosis and degenerative disc disease. IMPRESSION: 1. Stable scattered small pulmonary nodules. 2. The biliary dilatation shown on the prior exam is mildly improved. 3. Prior APR with left colostomy. As before there is a small amount of fluid density in the peristomal adipose tissues. 4.  Aortic Atherosclerosis (ICD10-I70.0). 5. Pelvic floor laxity with bulging of small  bowel loops along the pelvic floor. 6. Mildly prominent main pulmonary artery may indicate pulmonary arterial hypertension. Electronically Signed   By: Gaylyn Rong M.D.   On: 01/20/2022 10:32   MM 3D SCREEN BREAST BILATERAL  Result Date: 01/16/2022 CLINICAL DATA:  Screening. EXAM: DIGITAL SCREENING BILATERAL MAMMOGRAM WITH TOMOSYNTHESIS AND CAD TECHNIQUE: Bilateral screening digital craniocaudal and mediolateral oblique mammograms were obtained. Bilateral screening digital breast tomosynthesis was performed. The images were evaluated with computer-aided detection. COMPARISON:  Previous exam(s). ACR Breast Density Category b: There are scattered areas of fibroglandular density. FINDINGS: There are no findings suspicious for malignancy. IMPRESSION: No mammographic evidence of malignancy. A result letter of this screening mammogram will be mailed directly to the patient. RECOMMENDATION: Screening mammogram in one year. (Code:SM-B-01Y) BI-RADS CATEGORY  1: Negative. Electronically Signed   By: Annia Belt M.D.   On: 01/16/2022 14:16     ASSESSMENT & PLAN:  1. Encounter for antineoplastic chemotherapy   2. Metastatic adenocarcinoma (HCC)   3. Chemotherapy induced diarrhea   4. History of pulmonary embolism   5. History of DVT (deep vein thrombosis)   6. Chemotherapy induced neutropenia (HCC)    Cancer Staging  Rectal cancer [facility]) Staging form: Colon and Rectum, AJCC 8th Edition - Clinical stage from 01/23/2019: Stage IIIC  (cT4b, cN1a, cM0) - Signed by [name], MD on 01/23/2019 - Pathologic stage from 10/06/2019: Stage IIC (ypT4b, pN0, cM0) - Signed by [name], MD on 10/06/2019 - Pathologic: Stage Unknown (rpTX, pNX, cM1) - Signed by [name], MD on 01/31/2021  #History of stage IIIC Rectal cancer, s/p TNT, followed by 09/17/19 APR/posterior vaginectomy/TAH/BSO/VY-flap, pT4b pN0 with close vaginal margin 0.2 mm.  Uterus and ovaries negative for malignancy. palliative radiation to vaginal recurrence.  Finished 09/04/2020 01/19/21 recurrence with lung metastasis.-Palliative -FOLFIRI plus bevacizumab.  Irinotecan was dropped in November 2022 due to side effects. Negative for UGT1A1*28  CEA continues to progressively increase 01/20/2022 CT scan shows stable disease. Labs are reviewed and discussed with patient Proceed with dose reduced FOLFIRI-[Irinotecan 100 mg/m2] / bevacizumab Patient appears to tolerate current regimen. CEA continues to increase.  Recent image findings are consistent with stable disease. We will repeat images short-term for evaluation of treatment response.  RAS wild typel. Consider switch to irinotecan +panitumumab after progression .   Chemotherapy-induced diarrhea,   continue Imodium and lomotil as needed per instruction.continue cholestyramine 4g TID.   # chemotherapy induced neutropenia , patient will receive long-acting GCSF prophylaxis.   #Hypokalemia, potassium is stable.  Continue potassium chloride 20 mEq twice daily.  #History of pulmonary embolism, history of bilateral DVT Continue Eliquis 2.5 mg twice daily for anticoagulation prophylaxis.   Follow-up 2 weeks for next cycle of treatment.  [name], MD, PhD

## 2022-02-20 NOTE — Progress Notes (Signed)
Urine protein pending. Per Janeann Merl RN per Dr. Tasia Catchings wait for Urine protein results prior to proceeding with Mvasi, okay to proceed with Pre-meds and Irinotecan/Leucovorin prior to urine results.  ? ?

## 2022-02-21 LAB — CEA: CEA: 958 ng/mL — ABNORMAL HIGH (ref 0.0–4.7)

## 2022-02-22 ENCOUNTER — Ambulatory Visit: Payer: Medicare Other | Admitting: Radiation Oncology

## 2022-02-22 ENCOUNTER — Inpatient Hospital Stay: Payer: Medicare Other

## 2022-02-22 VITALS — BP 137/62 | HR 72

## 2022-02-22 DIAGNOSIS — Z87891 Personal history of nicotine dependence: Secondary | ICD-10-CM | POA: Diagnosis not present

## 2022-02-22 DIAGNOSIS — I10 Essential (primary) hypertension: Secondary | ICD-10-CM | POA: Diagnosis not present

## 2022-02-22 DIAGNOSIS — E876 Hypokalemia: Secondary | ICD-10-CM | POA: Diagnosis not present

## 2022-02-22 DIAGNOSIS — R197 Diarrhea, unspecified: Secondary | ICD-10-CM | POA: Diagnosis not present

## 2022-02-22 DIAGNOSIS — E7800 Pure hypercholesterolemia, unspecified: Secondary | ICD-10-CM | POA: Diagnosis not present

## 2022-02-22 DIAGNOSIS — D702 Other drug-induced agranulocytosis: Secondary | ICD-10-CM | POA: Diagnosis not present

## 2022-02-22 DIAGNOSIS — Z5111 Encounter for antineoplastic chemotherapy: Secondary | ICD-10-CM | POA: Diagnosis not present

## 2022-02-22 DIAGNOSIS — K219 Gastro-esophageal reflux disease without esophagitis: Secondary | ICD-10-CM | POA: Diagnosis not present

## 2022-02-22 DIAGNOSIS — C799 Secondary malignant neoplasm of unspecified site: Secondary | ICD-10-CM

## 2022-02-22 DIAGNOSIS — Z79899 Other long term (current) drug therapy: Secondary | ICD-10-CM | POA: Diagnosis not present

## 2022-02-22 DIAGNOSIS — Z5189 Encounter for other specified aftercare: Secondary | ICD-10-CM | POA: Diagnosis not present

## 2022-02-22 DIAGNOSIS — R609 Edema, unspecified: Secondary | ICD-10-CM | POA: Diagnosis not present

## 2022-02-22 DIAGNOSIS — C20 Malignant neoplasm of rectum: Secondary | ICD-10-CM | POA: Diagnosis not present

## 2022-02-22 DIAGNOSIS — Z86711 Personal history of pulmonary embolism: Secondary | ICD-10-CM | POA: Diagnosis not present

## 2022-02-22 DIAGNOSIS — R918 Other nonspecific abnormal finding of lung field: Secondary | ICD-10-CM | POA: Diagnosis not present

## 2022-02-22 DIAGNOSIS — Z86718 Personal history of other venous thrombosis and embolism: Secondary | ICD-10-CM | POA: Diagnosis not present

## 2022-02-22 DIAGNOSIS — T451X5A Adverse effect of antineoplastic and immunosuppressive drugs, initial encounter: Secondary | ICD-10-CM | POA: Diagnosis not present

## 2022-02-22 DIAGNOSIS — K5900 Constipation, unspecified: Secondary | ICD-10-CM | POA: Diagnosis not present

## 2022-02-22 DIAGNOSIS — Z9221 Personal history of antineoplastic chemotherapy: Secondary | ICD-10-CM | POA: Diagnosis not present

## 2022-02-22 DIAGNOSIS — R634 Abnormal weight loss: Secondary | ICD-10-CM | POA: Diagnosis not present

## 2022-02-22 MED ORDER — HEPARIN SOD (PORK) LOCK FLUSH 100 UNIT/ML IV SOLN
500.0000 [IU] | Freq: Once | INTRAVENOUS | Status: AC | PRN
  Administered 2022-02-22: 500 [IU]
  Filled 2022-02-22: qty 5

## 2022-02-22 MED ORDER — PEGFILGRASTIM-BMEZ 6 MG/0.6ML ~~LOC~~ SOSY
6.0000 mg | PREFILLED_SYRINGE | Freq: Once | SUBCUTANEOUS | Status: AC
  Administered 2022-02-22: 6 mg via SUBCUTANEOUS

## 2022-02-22 MED ORDER — SODIUM CHLORIDE 0.9% FLUSH
10.0000 mL | INTRAVENOUS | Status: DC | PRN
  Administered 2022-02-22: 10 mL
  Filled 2022-02-22: qty 10

## 2022-02-28 ENCOUNTER — Other Ambulatory Visit: Payer: Self-pay | Admitting: Family Medicine

## 2022-02-28 DIAGNOSIS — Z933 Colostomy status: Secondary | ICD-10-CM | POA: Diagnosis not present

## 2022-03-01 ENCOUNTER — Other Ambulatory Visit: Payer: Self-pay | Admitting: *Deleted

## 2022-03-01 DIAGNOSIS — I825Z3 Chronic embolism and thrombosis of unspecified deep veins of distal lower extremity, bilateral: Secondary | ICD-10-CM

## 2022-03-01 DIAGNOSIS — E876 Hypokalemia: Secondary | ICD-10-CM

## 2022-03-01 DIAGNOSIS — Z7901 Long term (current) use of anticoagulants: Secondary | ICD-10-CM

## 2022-03-01 MED ORDER — LIDOCAINE-PRILOCAINE 2.5-2.5 % EX CREA: 1.0000 "application " | TOPICAL_CREAM | CUTANEOUS | 6 refills | Status: DC | PRN

## 2022-03-01 MED ORDER — POTASSIUM CHLORIDE CRYS ER 20 MEQ PO TBCR: 40.0000 meq | EXTENDED_RELEASE_TABLET | Freq: Two times a day (BID) | ORAL | 1 refills | Status: DC

## 2022-03-01 MED ORDER — APIXABAN 2.5 MG PO TABS: 2.5000 mg | ORAL_TABLET | Freq: Two times a day (BID) | ORAL | 0 refills | Status: DC

## 2022-03-01 NOTE — Telephone Encounter (Signed)
Component Ref Range & Units 9 d ago ?(02/20/22) 3 wk ago ?(02/06/22) 1 mo ago ?(01/23/22) 1 mo ago ?(01/09/22) 2 mo ago ?(12/26/21) 2 mo ago ?(12/12/21) 3 mo ago ?(11/28/21)  ?Potassium 3.5 - 5.1 mmol/L 4.1  4.3  4.0  4.0  4.1  4.0  4.1   ? ?

## 2022-03-01 NOTE — Telephone Encounter (Signed)
Requested Prescriptions  ?Pending Prescriptions Disp Refills  ?? triamterene-hydrochlorothiazide (DYAZIDE) 37.5-25 MG capsule [Pharmacy Med Name: Triamterene-HCTZ 37.5-25 MG Oral Capsule] 90 capsule 1  ?  Sig: TAKE 1 CAPSULE BY MOUTH  DAILY  ?  ? Cardiovascular: Diuretic Combos Passed - 02/28/2022  7:10 PM  ?  ?  Passed - K in normal range and within 180 days  ?  Potassium  ?Date Value Ref Range Status  ?02/20/2022 4.1 3.5 - 5.1 mmol/L Final  ?   ?  ?  Passed - Na in normal range and within 180 days  ?  Sodium  ?Date Value Ref Range Status  ?02/20/2022 137 135 - 145 mmol/L Final  ?   ?  ?  Passed - Cr in normal range and within 180 days  ?  Creat  ?Date Value Ref Range Status  ?06/24/2021 1.10 (H) 0.60 - 1.00 mg/dL Final  ? ?Creatinine, Ser  ?Date Value Ref Range Status  ?02/20/2022 0.96 0.44 - 1.00 mg/dL Final  ?   ?  ?  Passed - Last BP in normal range  ?  BP Readings from Last 1 Encounters:  ?02/22/22 137/62  ?   ?  ?  Passed - Valid encounter within last 6 months  ?  Recent Outpatient Visits   ?      ? 1 month ago Metastatic adenocarcinoma ([place])  ? [place], DO  ? 8 months ago Annual physical exam  ? [place], DO  ? 1 year ago Benign hypertension with CKD (chronic kidney disease), stage II  ? [place], DO  ? 1 year ago Overgrown toenails  ? [place], DO  ?  ?  ?Future Appointments   ?        ? In 4 months  [facility], [place]  ? In 4 months Parks Ranger, [facility], [facility]  ?  ? ?  ?  ?  ?? simvastatin (ZOCOR) 40 MG tablet [Pharmacy Med Name: Simvastatin 40 MG Oral Tablet] 90 tablet 1  ?  Sig: TAKE 1 TABLET BY MOUTH AT  BEDTIME  ?  ? Cardiovascular:  Antilipid - Statins Failed - 02/28/2022  7:10 PM  ?  ?  Failed - Lipid Panel in normal range within the last 12 months  ?  Cholesterol  ?Date Value Ref  Range Status  ?06/24/2021 156 <200 mg/dL Final  ? ?LDL Cholesterol (Calc)  ?Date Value Ref Range Status  ?06/24/2021 77 mg/dL (calc) Final  ?  Comment:  ?  Reference range: <100 ?Marland Kitchen ?Desirable range <100 mg/dL for primary prevention;   ?<70 mg/dL for patients with CHD or diabetic patients  ?with > or = 2 CHD risk factors. ?. ?LDL-C is now calculated using the Martin-Hopkins  ?calculation, which is a validated novel method providing  ?better accuracy than the Friedewald equation in the  ?estimation of LDL-C.  ?Cresenciano Genre et al. Annamaria Helling. 1017;510(25): 2061-2068  ?(http://education.[employer].com/faq/FAQ164) ?  ? ?HDL  ?Date Value Ref Range Status  ?06/24/2021 61 > OR = 50 mg/dL Final  ? ?Triglycerides  ?Date Value Ref Range Status  ?06/24/2021 95 <150 mg/dL Final  ? ?  ?  ?  Passed - Patient is not pregnant  ?  ?  Passed - Valid encounter within last 12 months  ?  Recent Outpatient Visits   ?      ?  1 month ago Metastatic adenocarcinoma ([place])  ? [place], DO  ? 8 months ago Annual physical exam  ? [place], DO  ? 1 year ago Benign hypertension with CKD (chronic kidney disease), stage II  ? [place], DO  ? 1 year ago Overgrown toenails  ? [place], DO  ?  ?  ?Future Appointments   ?        ? In 4 months  [facility], [place]  ? In 4 months Parks Ranger, Devonne Doughty, DO [facility] [place] [facility]  ?  ? ?  ?  ?  ? ?

## 2022-03-03 ENCOUNTER — Other Ambulatory Visit: Payer: Self-pay

## 2022-03-03 MED ORDER — IPRATROPIUM BROMIDE 0.03 % NA SOLN: 1.0000 | Freq: Two times a day (BID) | NASAL | 2 refills | Status: DC

## 2022-03-03 MED FILL — Dexamethasone Sodium Phosphate Inj 100 MG/10ML: INTRAMUSCULAR | Qty: 1 | Status: AC

## 2022-03-03 MED FILL — Fluorouracil IV Soln 5 GM/100ML (50 MG/ML): INTRAVENOUS | Qty: 86 | Status: AC

## 2022-03-06 ENCOUNTER — Encounter: Payer: Self-pay | Admitting: Oncology

## 2022-03-06 ENCOUNTER — Inpatient Hospital Stay (HOSPITAL_BASED_OUTPATIENT_CLINIC_OR_DEPARTMENT_OTHER): Payer: Medicare Other | Admitting: Oncology

## 2022-03-06 ENCOUNTER — Inpatient Hospital Stay: Payer: Medicare Other

## 2022-03-06 VITALS — BP 124/52 | HR 72 | Resp 16

## 2022-03-06 VITALS — BP 127/64 | HR 75 | Temp 97.1°F | Resp 18 | Wt 161.6 lb

## 2022-03-06 DIAGNOSIS — Z86718 Personal history of other venous thrombosis and embolism: Secondary | ICD-10-CM | POA: Diagnosis not present

## 2022-03-06 DIAGNOSIS — Z5111 Encounter for antineoplastic chemotherapy: Secondary | ICD-10-CM | POA: Diagnosis not present

## 2022-03-06 DIAGNOSIS — D701 Agranulocytosis secondary to cancer chemotherapy: Secondary | ICD-10-CM | POA: Diagnosis not present

## 2022-03-06 DIAGNOSIS — Z79899 Other long term (current) drug therapy: Secondary | ICD-10-CM | POA: Diagnosis not present

## 2022-03-06 DIAGNOSIS — R197 Diarrhea, unspecified: Secondary | ICD-10-CM | POA: Diagnosis not present

## 2022-03-06 DIAGNOSIS — Z87891 Personal history of nicotine dependence: Secondary | ICD-10-CM | POA: Diagnosis not present

## 2022-03-06 DIAGNOSIS — R918 Other nonspecific abnormal finding of lung field: Secondary | ICD-10-CM | POA: Diagnosis not present

## 2022-03-06 DIAGNOSIS — R609 Edema, unspecified: Secondary | ICD-10-CM | POA: Diagnosis not present

## 2022-03-06 DIAGNOSIS — Z9221 Personal history of antineoplastic chemotherapy: Secondary | ICD-10-CM | POA: Diagnosis not present

## 2022-03-06 DIAGNOSIS — E876 Hypokalemia: Secondary | ICD-10-CM

## 2022-03-06 DIAGNOSIS — K521 Toxic gastroenteritis and colitis: Secondary | ICD-10-CM | POA: Diagnosis not present

## 2022-03-06 DIAGNOSIS — D702 Other drug-induced agranulocytosis: Secondary | ICD-10-CM | POA: Diagnosis not present

## 2022-03-06 DIAGNOSIS — C799 Secondary malignant neoplasm of unspecified site: Secondary | ICD-10-CM | POA: Diagnosis not present

## 2022-03-06 DIAGNOSIS — T451X5A Adverse effect of antineoplastic and immunosuppressive drugs, initial encounter: Secondary | ICD-10-CM

## 2022-03-06 DIAGNOSIS — Z86711 Personal history of pulmonary embolism: Secondary | ICD-10-CM | POA: Diagnosis not present

## 2022-03-06 DIAGNOSIS — K5900 Constipation, unspecified: Secondary | ICD-10-CM | POA: Diagnosis not present

## 2022-03-06 DIAGNOSIS — K219 Gastro-esophageal reflux disease without esophagitis: Secondary | ICD-10-CM | POA: Diagnosis not present

## 2022-03-06 DIAGNOSIS — C20 Malignant neoplasm of rectum: Secondary | ICD-10-CM

## 2022-03-06 DIAGNOSIS — Z5189 Encounter for other specified aftercare: Secondary | ICD-10-CM | POA: Diagnosis not present

## 2022-03-06 DIAGNOSIS — R634 Abnormal weight loss: Secondary | ICD-10-CM | POA: Diagnosis not present

## 2022-03-06 DIAGNOSIS — E7800 Pure hypercholesterolemia, unspecified: Secondary | ICD-10-CM | POA: Diagnosis not present

## 2022-03-06 DIAGNOSIS — I10 Essential (primary) hypertension: Secondary | ICD-10-CM | POA: Diagnosis not present

## 2022-03-06 LAB — CBC WITH DIFFERENTIAL/PLATELET
Abs Immature Granulocytes: 0.07 10*3/uL (ref 0.00–0.07)
Basophils Absolute: 0 10*3/uL (ref 0.0–0.1)
Basophils Relative: 0 %
Eosinophils Absolute: 0.1 10*3/uL (ref 0.0–0.5)
Eosinophils Relative: 2 %
HCT: 38.4 % (ref 36.0–46.0)
Hemoglobin: 12 g/dL (ref 12.0–15.0)
Immature Granulocytes: 1 %
Lymphocytes Relative: 17 %
Lymphs Abs: 1.3 10*3/uL (ref 0.7–4.0)
MCH: 30.5 pg (ref 26.0–34.0)
MCHC: 31.3 g/dL (ref 30.0–36.0)
MCV: 97.5 fL (ref 80.0–100.0)
Monocytes Absolute: 0.6 10*3/uL (ref 0.1–1.0)
Monocytes Relative: 8 %
Neutro Abs: 5.2 10*3/uL (ref 1.7–7.7)
Neutrophils Relative %: 72 %
Platelets: 223 10*3/uL (ref 150–400)
RBC: 3.94 MIL/uL (ref 3.87–5.11)
RDW: 17.3 % — ABNORMAL HIGH (ref 11.5–15.5)
WBC: 7.3 10*3/uL (ref 4.0–10.5)
nRBC: 0 % (ref 0.0–0.2)

## 2022-03-06 LAB — COMPREHENSIVE METABOLIC PANEL
ALT: 14 U/L (ref 0–44)
AST: 26 U/L (ref 15–41)
Albumin: 3.9 g/dL (ref 3.5–5.0)
Alkaline Phosphatase: 71 U/L (ref 38–126)
Anion gap: 6 (ref 5–15)
BUN: 14 mg/dL (ref 8–23)
CO2: 28 mmol/L (ref 22–32)
Calcium: 9.1 mg/dL (ref 8.9–10.3)
Chloride: 104 mmol/L (ref 98–111)
Creatinine, Ser: 0.9 mg/dL (ref 0.44–1.00)
GFR, Estimated: >60 mL/min (ref >60)
Glucose, Bld: 97 mg/dL (ref 70–99)
Potassium: 4.3 mmol/L (ref 3.5–5.1)
Sodium: 138 mmol/L (ref 135–145)
Total Bilirubin: 0.6 mg/dL (ref 0.3–1.2)
Total Protein: 6.9 g/dL (ref 6.5–8.1)

## 2022-03-06 LAB — PROTEIN, URINE, RANDOM: Total Protein, Urine: 20 mg/dL

## 2022-03-06 MED ORDER — SODIUM CHLORIDE 0.9 % IV SOLN
350.0000 mg | Freq: Once | INTRAVENOUS | Status: AC
  Administered 2022-03-06: 350 mg via INTRAVENOUS
  Filled 2022-03-06: qty 14

## 2022-03-06 MED ORDER — SODIUM CHLORIDE 0.9 % IV SOLN
750.0000 mg | Freq: Once | INTRAVENOUS | Status: AC
  Administered 2022-03-06: 750 mg via INTRAVENOUS
  Filled 2022-03-06: qty 37.5

## 2022-03-06 MED ORDER — SODIUM CHLORIDE 0.9 % IV SOLN
2400.0000 mg/m2 | INTRAVENOUS | Status: DC
  Administered 2022-03-06: 4300 mg via INTRAVENOUS
  Filled 2022-03-06: qty 86

## 2022-03-06 MED ORDER — PALONOSETRON HCL INJECTION 0.25 MG/5ML
0.2500 mg | Freq: Once | INTRAVENOUS | Status: AC
  Administered 2022-03-06: 0.25 mg via INTRAVENOUS
  Filled 2022-03-06: qty 5

## 2022-03-06 MED ORDER — SODIUM CHLORIDE 0.9 % IV SOLN
10.0000 mg | Freq: Once | INTRAVENOUS | Status: AC
  Administered 2022-03-06: 10 mg via INTRAVENOUS
  Filled 2022-03-06: qty 10

## 2022-03-06 MED ORDER — ATROPINE SULFATE 1 MG/ML IV SOLN
0.4000 mg | Freq: Once | INTRAVENOUS | Status: AC
  Administered 2022-03-06: 0.4 mg via INTRAVENOUS
  Filled 2022-03-06: qty 1

## 2022-03-06 MED ORDER — SODIUM CHLORIDE 0.9 % IV SOLN
100.0000 mg/m2 | Freq: Once | INTRAVENOUS | Status: AC
  Administered 2022-03-06: 180 mg via INTRAVENOUS
  Filled 2022-03-06: qty 5

## 2022-03-06 MED ORDER — SODIUM CHLORIDE 0.9 % IV SOLN
Freq: Once | INTRAVENOUS | Status: AC
  Filled 2022-03-06: qty 250

## 2022-03-06 MED ORDER — DIPHENHYDRAMINE HCL 50 MG/ML IJ SOLN
25.0000 mg | Freq: Every day | INTRAMUSCULAR | Status: DC | PRN
  Administered 2022-03-06: 25 mg via INTRAVENOUS
  Filled 2022-03-06: qty 1

## 2022-03-06 NOTE — Progress Notes (Signed)
Hematology/Oncology Progress note [phone] [phone]      Patient Care Team: Olin Hauser, DO as PCP - General (Family Medicine) Clent Jacks, RN as Registered Nurse Earlie Server, MD as Consulting Physician (Hematology and Oncology)  CHIEF COMPLAINTS/REASON FOR VISIT:  Follow up for rectal cancer  HISTORY OF PRESENTING ILLNESS:  Patient initially presented with complaints of postmenopausal bleeding on 08/16/2018.  History of was menopausal vaginal bleeding in 2016 which resulted in cervical polypectomy.  Pathology 02/04/2015 showed cervical polyp, consistent with benign endometrial polyp.  [name] after polypectomy due to anxiety associated with pelvic exams.  pelvic exam on 08/16/2018 reviewed cervical abnormality and from enlarged uterus. Seen by Dr. Marcelline Mates on 10/29/2018.  Endometrial biopsy and a Pap smear was performed. 10/29/2018 Pap smear showed adenocarcinoma, favor endometrial origin. 10/29/2018 endometrial biopsy showed endometrioid carcinoma, FIGO grade 1.  10/29/2018- TA & TV Ultrasound revealed: Anteverted uterus measuring 8.7 x 5.6 x 6.4 cm without evidence of focal masses.  The endometrium measuring 24.1 mm (thickened) and heterogeneous.  Right and left ovaries not visualized.  No adnexal masses identified.  No free fluid in cul-de-sac.  Patient was seen by Dr. Theora Gianotti in clinic on 11/13/2018.  Cervical exam reveals 2 cm exophytic irregular mass consistent with malignancy.   11/19/2018 CT chest abdomen pelvis with contrast showed thickened endometrium with some irregularity compatible with the provided diagnosis of endometrial malignancy.  There is a mildly prominent left inguinal node 1.4 cm.  Patient was seen by Dr. Fransisca Connors on 11/20/2018 and left groin lymph node biopsy was recommended.  11/26/2018 patient underwent left inguinal lymph node biopsy. Pathology showed metastatic adenocarcinoma consistent with colorectal origin.  CDX  2+.  Case was discussed on tumor board.  Recommend colonoscopy for further evaluation.  Patient reports significant weight loss 30 pounds over the last year.  Chronic vaginal spotting. Change of bowel habits the past few months.  More constipated.  Family history positive for brother who has colon cancer prostate cancer.  patient has underwent colonoscopy on 12/03/2018 which reviewed a nonobstructing large mass in the rectum.  Also chronic fistula.  Mass was not circumferential.  This was biopsied with a cold forceps for histology.  Pathology came back hyperplastic polyp negative for dysplasia and malignancy. [name], patient underwent flex sigmoidoscopy on 12/06/2018 with rebiopsy of the rectal mass. This time biopsy results came back positive for invasive colorectal adenocarcinoma, moderately differentiated. Immunotherapy for nearly mismatch repair protein (MMR ) was performed.  There is no loss of MMR expression.  low probability of MSI high.   # Seen by [name] for evaluation of resectability for rectal cancer. In addition, she also had a second opinion with [facility] where her endometrial biopsy pathology was changed to  adenocarcinoma, consistent with colorectal primary.   [name]. She has home health that has been assisting with ostomy care  Patient was also evaluated by [facility] oncology.  Recommendation is to proceed with TNT with concurrent chemoradiation followed by neoadjuvant chemotherapy followed by surgical resection. Patient prefers to have treatment done locally with [facility].   # Oncology Treatment:  02/03/2019- 03/19/2019  concurrent Xeloda and radiation.  Xeloda dose 82m /m2 BID - rounded to 16531mBID- on days of radiation. 04/09/2019, started on FOLFOX with bolus early.  Omitted.  07/16/2019 finished 8 cycles of FOLFOX. 09/17/19 APR/posterior vaginectomy/TAH/BSO/VY-flap pT4b pN0 with close vaginal margin 0.2  mm.  Uterus and ovaries negative  for malignancy. Patient reports bilateral lower extremity numbness and tingling, intermittent, left worse than right. She has lost a lot of weight since her APR surgery.   #Family history with half brother having's history of colon cancer prostate cancer.  Personal history of colorectal cancer.  Patient has not decided if she wants genetic testing.    # history of PE( 01/13/2020)  in the bilateral lower extremity DVT (01/13/2020).   She finishes 6 months of anticoagulation with Eliquis 5 mg twice daily. Now switched to Eliquis 2.5 mg twice daily..  # She has now developed recurrent disease. #06/30/20  vaginal introitus mass biopsied. Pathology is consistent with metastatic colorectal adenocarcinoma I have discussed with [name]  Dr. Hester Mates and the mass is not resectable. Patient has also had colonoscopy by Dr. Vicente Males yesterday. Normal examination. # 07/16/2020 cycle 1 FOLFIRI  # 07/20/2020 PET scan was done for further evaluation, images are consistent with local recurrence, no distant metastasis. #Discussed with radiation oncology Dr. Baruch Gouty will recommends concurrent chemotherapy and radiation. 08/02/2020-08/16/2020, patient starts radiation.  Xeloda was held due to neutropenia 08/17/2020,-09/06/2020 Xeloda 1500 mg twice daily concurrently with radiation  01/31/21 started on FOLFIRI + Bev 05/18/2021 CT chest abdomen pelvis showed Previously noted enlargement of bilateral inguinal lymph nodes is resolved, consistent with treatment response of nodal metastatic disease. Interval decrease in size of multiple small bilateral pulmonary nodules, consistent with treatment response of pulmonary metastatic disease. No evidence of new metastatic disease. 05/24/2021 - 08/30/2021, continued on FOLFIRI plus bevacizumab.  Irinotecan dose was reduced, eventually 160m/m2  09/02/2021, CT chest abdomen pelvis without contrast Showed small bilateral pulmonary nodules, unchanged.   Stable metastatic disease.  No noncontrast evidence of new metastatic disease in the chest abdomen pelvis.  Small parastomal hernia.  Enlargement of main pulmonary artery.  Coronary artery disease.  09/13/2021, maintenance 5-FU/bevacizumab 11/28/2021, 5-FU/Irinotecan/bevacizumab.  Irinotecan 100 mg/m2 was added back due to progressively increasing CEA.  INTERVAL HISTORY Jacqueline DADDARIOis a 76y.o. female who has above history reviewed by me presents for follow-up of rectal cancer. Currently on dose reduced FOLFIRI/bevacizumab  overall she tolerates well.  Patient reports that diarrhea is manageable.   She takes Imodium/Lomotil as needed.  Also on cholestyramine She was accompanied by her daughter today.  No new complaints.   Denies any abdominal pain, nausea vomiting.  She changed her colostomy bag every other day.    Review of Systems  Constitutional:  Positive for fatigue. Negative for appetite change, chills, fever and unexpected weight change.  HENT:   Negative for hearing loss and voice change.   Eyes:  Negative for eye problems.  Respiratory:  Negative for chest tightness and cough.   Cardiovascular:  Negative for chest pain.  Gastrointestinal:  Negative for abdominal distention, abdominal pain, blood in stool, constipation, diarrhea and nausea.  Endocrine: Negative for hot flashes.  Genitourinary:  Negative for difficulty urinating and frequency.   Musculoskeletal:  Negative for arthralgias.  Skin:  Negative for itching and rash.  Neurological:  Negative for extremity weakness and numbness.  Hematological:  Negative for adenopathy.  Psychiatric/Behavioral:  Negative for confusion.    MEDICAL HISTORY:  Past Medical History:  Diagnosis Date   Allergy    Arthritis    Blood clot in vein    Family history of colon cancer    GERD (gastroesophageal reflux disease)    Hypercholesteremia    Hypertension    Hypertension    Lower extremity edema    Personal history  of chemotherapy     Rectal cancer ([place]) 12/2018   Urinary incontinence     SURGICAL HISTORY: Past Surgical History:  Procedure Laterality Date   ABDOMINAL HYSTERECTOMY     CHOLECYSTECTOMY  1971   COLONOSCOPY WITH PROPOFOL N/A 12/03/2018   Procedure: COLONOSCOPY WITH PROPOFOL;  Surgeon: Lucilla Lame, MD;  Location: [facility] ENDOSCOPY;  Service: Endoscopy;  Laterality: N/A;   COLONOSCOPY WITH PROPOFOL N/A 07/15/2020   Procedure: COLONOSCOPY WITH PROPOFOL;  Surgeon: [name], MD;  Location: [facility] ENDOSCOPY;  Service: Gastroenterology;  Laterality: N/A;   FLEXIBLE SIGMOIDOSCOPY N/A 12/06/2018   Procedure: FLEXIBLE SIGMOIDOSCOPY;  Surgeon: [name], MD;  Location: [facility] ENDOSCOPY;  Service: Endoscopy;  Laterality: N/A;   LAPAROSCOPIC COLOSTOMY  01/06/2019   PORTACATH PLACEMENT N/A 04/03/2019   Procedure: INSERTION PORT-A-CATH;  Surgeon: Jules Husbands, MD;  Location: [facility] ORS;  Service: General;  Laterality: N/A;    SOCIAL HISTORY: Social History   Socioeconomic History   Marital status: Widowed    Spouse name: Not on file   Number of children: Not on file   Years of education: Not on file   Highest education level: Not on file  Occupational History   Not on file  Tobacco Use   Smoking status: Former    Types: Cigarettes    Quit date: 12/02/1977    Years since quitting: 44.2   Smokeless tobacco: Former  Scientific laboratory technician Use: Never used  Substance and Sexual Activity   Alcohol use: Never   Drug use: Never   Sexual activity: Not Currently    Birth control/protection: None  Other Topics Concern   Not on file  Social History Narrative   Lives with daughter   Social Determinants of Health   Financial Resource Strain: Low Risk    Difficulty of Paying Living Expenses: Not hard at all  Food Insecurity: No Food Insecurity   Worried About Charity fundraiser in the Last Year: Never true   [place] in the Last Year: Never true  Transportation Needs: No Transportation Needs   Lack of  Transportation (Medical): No   Lack of Transportation (Non-Medical): No  Physical Activity: Inactive   Days of Exercise per Week: 0 days   Minutes of Exercise per Session: 0 min  Stress: No Stress Concern Present   Feeling of Stress : Not at all  Social Connections: Not on file  Intimate Partner Violence: Not on file    FAMILY HISTORY: Family History  Problem Relation Age of Onset   Colon cancer Brother 19       exposure to chemicals [place]   Hypertension Mother    Stroke Mother    Kidney failure Father    Breast cancer Neg Hx    Ovarian cancer Neg Hx     ALLERGIES:  is allergic to sulfamethoxazole-trimethoprim.  MEDICATIONS:  Current Outpatient Medications  Medication Sig Dispense Refill   apixaban (ELIQUIS) 2.5 MG TABS tablet Take 1 tablet (2.5 mg total) by mouth 2 (two) times daily. 60 tablet 0   Cholecalciferol (VITAMIN D3) 2000 units capsule Take 2,000 Units by mouth daily.     cholestyramine (QUESTRAN) 4 g packet Take 1 packet (4 g total) by mouth 3 (three) times daily. 90 each 1   diclofenac sodium (VOLTAREN) 1 % GEL Apply 2 g topically 4 (four) times daily as needed (joint pain).  11   diphenoxylate-atropine (LOMOTIL) 2.5-0.025 MG tablet Take 1 tablet by mouth 4 (four) times daily  as needed for diarrhea or loose stools. (Patient not taking: Reported on 09/27/2021) 30 tablet 1   fluticasone (FLONASE) 50 MCG/ACT nasal spray USE 1 SPRAY IN EACH NOSTRIL ONCE DAILY 16 g 3   gabapentin (NEURONTIN) 100 MG capsule Take 1 capsule (100 mg total) by mouth at bedtime. 30 capsule 0   ipratropium (ATROVENT) 0.03 % nasal spray Place 1 spray into both nostrils 2 (two) times daily. 30 mL 2   lidocaine-prilocaine (EMLA) cream Apply 1 application. topically as needed. 30 g 6   loperamide (IMODIUM) 2 MG capsule Take 1 capsule (2 mg total) by mouth See admin instructions. With onset of loose stool, take 78m followed by 249mevery 2 hours,  Maximum: 16 mg/day 120 capsule 1   loratadine  (CLARITIN) 10 MG tablet Take 10 mg by mouth daily.     Multiple Vitamins-Minerals (ONE-A-DAY WOMENS 50 PLUS PO) Take 1 tablet by mouth daily.      ondansetron (ZOFRAN) 8 MG tablet Take 1 tablet (8 mg total) by mouth 2 (two) times daily as needed for refractory nausea / vomiting. Start on day 3 after chemotherapy. 30 tablet 1   potassium chloride SA (KLOR-CON M) 20 MEQ tablet Take 2 tablets (40 mEq total) by mouth 2 (two) times daily. 120 tablet 1   prochlorperazine (COMPAZINE) 10 MG tablet Take 1 tablet (10 mg total) by mouth every 6 (six) hours as needed (NAUSEA). 30 tablet 1   simvastatin (ZOCOR) 40 MG tablet TAKE 1 TABLET BY MOUTH AT  BEDTIME 90 tablet 1   triamterene-hydrochlorothiazide (DYAZIDE) 37.5-25 MG capsule TAKE 1 CAPSULE BY MOUTH  DAILY 90 capsule 1   zinc gluconate 50 MG tablet Take 50 mg by mouth daily.     No current facility-administered medications for this visit.     PHYSICAL EXAMINATION: ECOG PERFORMANCE STATUS: 1 - Symptomatic but completely ambulatory  Physical Exam Constitutional:      General: She is not in acute distress. HENT:     Head: Normocephalic and atraumatic.  Eyes:     General: No scleral icterus. Cardiovascular:     Rate and Rhythm: Normal rate and regular rhythm.     Heart sounds: Normal heart sounds.  Pulmonary:     Effort: Pulmonary effort is normal. No respiratory distress.     Breath sounds: No wheezing.  Abdominal:     General: Bowel sounds are normal. There is no distension.     Palpations: Abdomen is soft.     Comments: + Colostomy bag   Musculoskeletal:        General: No deformity. Normal range of motion.     Cervical back: Normal range of motion and neck supple.  Skin:    General: Skin is warm and dry.     Findings: No erythema or rash.  Neurological:     Mental Status: She is alert and oriented to person, place, and time. Mental status is at baseline.     Cranial Nerves: No cranial nerve deficit.     Coordination: Coordination  normal.      LABORATORY DATA:  I have reviewed the data as listed Lab Results  Component Value Date   WBC 6.2 02/20/2022   HGB 12.0 02/20/2022   HCT 37.7 02/20/2022   MCV 96.2 02/20/2022   PLT 204 02/20/2022   Recent Labs    01/23/22 0857 02/06/22 0833 02/20/22 0804  NA 137 139 137  K 4.0 4.3 4.1  CL 102 106 103  CO2 27 26  29  GLUCOSE 91 96 102*  BUN _0 CREATININE 0.91 0.89 0.96  CALCIUM 9.5 9.5 9.3  GFRNONAA >60 >60 >60  PROT 6.8 6.5 6.9  ALBUMIN 4.0 3.9 4.1  AST _1 ALT _2 ALKPHOS 72 73 71  BILITOT 0.8 0.2* 0.4    Iron/TIBC/Ferritin/ %Sat    Component Value Date/Time   IRON 40 06/23/2020 1124   TIBC 246 (L) 06/23/2020 1124   FERRITIN 338 (H) 06/23/2020 1124   IRONPCTSAT 16 06/23/2020 1124     RADIOGRAPHIC STUDIES: I have personally reviewed the radiological images as listed and agreed with the findings in the report. CT CHEST ABDOMEN PELVIS W CONTRAST  Result Date: 01/20/2022 CLINICAL DATA:  Restaging rectal cancer.  Ongoing chemotherapy. * Tracking Code: BO * EXAM: CT CHEST, ABDOMEN, AND PELVIS WITH CONTRAST TECHNIQUE: Multidetector CT imaging of the chest, abdomen and pelvis was performed following the standard protocol during bolus administration of intravenous contrast. RADIATION DOSE REDUCTION: This exam was performed according to the departmental dose-optimization program which includes automated exposure control, adjustment of the mA and/or kV according to patient size and/or use of iterative reconstruction technique. CONTRAST:  39m OMNIPAQUE IOHEXOL 300 MG/ML  SOLN COMPARISON:  Multiple exams, including 11/01/2021 FINDINGS: CT CHEST FINDINGS Cardiovascular: Right Port-A-Cath tip: Cavoatrial junction. Mildly prominent main pulmonary artery. Mediastinum/Nodes: Small left supraclavicular lymph nodes measuring up to 0.5 cm in short axis on image 8 series 2, stable. No current pathologic adenopathy in the chest. Lungs/Pleura: Scattered stable  bilateral pulmonary nodules. An index 4 by 3 mm left upper lobe nodule previously measured 4 by 3 mm when measured in the same fashion. An index 6 by 4 by 6 mm right middle lobe pulmonary nodule is stable. No new pulmonary nodules are identified. Stable mild scarring posteriorly in the right lower lobe and inferiorly in the right middle lobe. Musculoskeletal: Unremarkable CT ABDOMEN PELVIS FINDINGS Hepatobiliary: Gallbladder absent. Mild but improved biliary dilatation, common bile duct currently 0.7 cm on image 61 series 2, formerly 0.9 cm at this location. No focal hepatic abnormality observed. Pancreas: Borderline dorsal pancreatic duct dilatation, improved from prior. Spleen: Unremarkable Adrenals/Urinary Tract: No change in bilateral renal cysts. Some of the hypodense renal lesions are too small to characterize. In general, such lesions are not felt to warrant further workup unless there is a specific renal clinical concern. Adrenal glands unremarkable. Stomach/Bowel: Prior APR with left colostomy. The wall thickening in segments of the large bowel is probably attributable to nondistention. Small amount of fluid signal along the fatty tissues at the ostomy site, as before. Pelvic floor laxity with bulging small bowel loops posteriorly along the pelvic floor. Vascular/Lymphatic: Atherosclerosis is present, including aortoiliac atherosclerotic disease. No pathologic adenopathy. Reproductive: Prior hysterectomy.  Adnexa unremarkable. Other: No supplemental non-categorized findings. Musculoskeletal: Lumbar spondylosis and degenerative disc disease. IMPRESSION: 1. Stable scattered small pulmonary nodules. 2. The biliary dilatation shown on the prior exam is mildly improved. 3. Prior APR with left colostomy. As before there is a small amount of fluid density in the peristomal adipose tissues. 4.  Aortic Atherosclerosis (ICD10-I70.0). 5. Pelvic floor laxity with bulging of small bowel loops along the pelvic floor. 6.  Mildly prominent main pulmonary artery may indicate pulmonary arterial hypertension. Electronically Signed   By: WVan ClinesM.D.   On: 01/20/2022 10:32   MM 3D SCREEN BREAST BILATERAL  Result Date: 01/16/2022 CLINICAL DATA:  Screening. EXAM: DIGITAL SCREENING BILATERAL MAMMOGRAM WITH TOMOSYNTHESIS AND  CAD TECHNIQUE: Bilateral screening digital craniocaudal and mediolateral oblique mammograms were obtained. Bilateral screening digital breast tomosynthesis was performed. The images were evaluated with computer-aided detection. COMPARISON:  Previous exam(s). ACR Breast Density Category b: There are scattered areas of fibroglandular density. FINDINGS: There are no findings suspicious for malignancy. IMPRESSION: No mammographic evidence of malignancy. A result letter of this screening mammogram will be mailed directly to the patient. RECOMMENDATION: Screening mammogram in one year. (Code:SM-B-01Y) BI-RADS CATEGORY  1: Negative. Electronically Signed   By: Lovey Newcomer M.D.   On: 01/16/2022 14:16      CT CHEST ABDOMEN PELVIS W CONTRAST  Result Date: 01/20/2022 CLINICAL DATA:  Restaging rectal cancer.  Ongoing chemotherapy. * Tracking Code: BO * EXAM: CT CHEST, ABDOMEN, AND PELVIS WITH CONTRAST TECHNIQUE: Multidetector CT imaging of the chest, abdomen and pelvis was performed following the standard protocol during bolus administration of intravenous contrast. RADIATION DOSE REDUCTION: This exam was performed according to the departmental dose-optimization program which includes automated exposure control, adjustment of the mA and/or kV according to patient size and/or use of iterative reconstruction technique. CONTRAST:  86m OMNIPAQUE IOHEXOL 300 MG/ML  SOLN COMPARISON:  Multiple exams, including 11/01/2021 FINDINGS: CT CHEST FINDINGS Cardiovascular: Right Port-A-Cath tip: Cavoatrial junction. Mildly prominent main pulmonary artery. Mediastinum/Nodes: Small left supraclavicular lymph nodes measuring up to  0.5 cm in short axis on image 8 series 2, stable. No current pathologic adenopathy in the chest. Lungs/Pleura: Scattered stable bilateral pulmonary nodules. An index 4 by 3 mm left upper lobe nodule previously measured 4 by 3 mm when measured in the same fashion. An index 6 by 4 by 6 mm right middle lobe pulmonary nodule is stable. No new pulmonary nodules are identified. Stable mild scarring posteriorly in the right lower lobe and inferiorly in the right middle lobe. Musculoskeletal: Unremarkable CT ABDOMEN PELVIS FINDINGS Hepatobiliary: Gallbladder absent. Mild but improved biliary dilatation, common bile duct currently 0.7 cm on image 61 series 2, formerly 0.9 cm at this location. No focal hepatic abnormality observed. Pancreas: Borderline dorsal pancreatic duct dilatation, improved from prior. Spleen: Unremarkable Adrenals/Urinary Tract: No change in bilateral renal cysts. Some of the hypodense renal lesions are too small to characterize. In general, such lesions are not felt to warrant further workup unless there is a specific renal clinical concern. Adrenal glands unremarkable. Stomach/Bowel: Prior APR with left colostomy. The wall thickening in segments of the large bowel is probably attributable to nondistention. Small amount of fluid signal along the fatty tissues at the ostomy site, as before. Pelvic floor laxity with bulging small bowel loops posteriorly along the pelvic floor. Vascular/Lymphatic: Atherosclerosis is present, including aortoiliac atherosclerotic disease. No pathologic adenopathy. Reproductive: Prior hysterectomy.  Adnexa unremarkable. Other: No supplemental non-categorized findings. Musculoskeletal: Lumbar spondylosis and degenerative disc disease. IMPRESSION: 1. Stable scattered small pulmonary nodules. 2. The biliary dilatation shown on the prior exam is mildly improved. 3. Prior APR with left colostomy. As before there is a small amount of fluid density in the peristomal adipose  tissues. 4.  Aortic Atherosclerosis (ICD10-I70.0). 5. Pelvic floor laxity with bulging of small bowel loops along the pelvic floor. 6. Mildly prominent main pulmonary artery may indicate pulmonary arterial hypertension. Electronically Signed   By: WVan ClinesM.D.   On: 01/20/2022 10:32   MM 3D SCREEN BREAST BILATERAL  Result Date: 01/16/2022 CLINICAL DATA:  Screening. EXAM: DIGITAL SCREENING BILATERAL MAMMOGRAM WITH TOMOSYNTHESIS AND CAD TECHNIQUE: Bilateral screening digital craniocaudal and mediolateral oblique mammograms were obtained. Bilateral screening digital  breast tomosynthesis was performed. The images were evaluated with computer-aided detection. COMPARISON:  Previous exam(s). ACR Breast Density Category b: There are scattered areas of fibroglandular density. FINDINGS: There are no findings suspicious for malignancy. IMPRESSION: No mammographic evidence of malignancy. A result letter of this screening mammogram will be mailed directly to the patient. RECOMMENDATION: Screening mammogram in one year. (Code:SM-B-01Y) BI-RADS CATEGORY  1: Negative. Electronically Signed   By: Lovey Newcomer M.D.   On: 01/16/2022 14:16     ASSESSMENT & PLAN:  1. Metastatic adenocarcinoma ([place])   2. Rectal cancer ([place])   3. Encounter for antineoplastic chemotherapy   4. History of pulmonary embolism   5. History of DVT (deep vein thrombosis)   6. Chemotherapy induced neutropenia ([facility])   7. Chemotherapy induced diarrhea   8. Hypokalemia    Cancer Staging  Rectal cancer [facility]) Staging form: Colon and Rectum, AJCC 8th Edition - Clinical stage from 01/23/2019: Stage IIIC (cT4b, cN1a, cM0) - Signed by Earlie Server, MD on 01/23/2019 - Pathologic stage from 10/06/2019: Stage IIC (ypT4b, pN0, cM0) - Signed by Earlie Server, MD on 10/06/2019 - Pathologic: Stage Unknown (rpTX, pNX, cM1) - Signed by Earlie Server, MD on 01/31/2021  #History of stage IIIC Rectal cancer, s/p TNT, followed by 09/17/19 APR/posterior  vaginectomy/TAH/BSO/VY-flap, pT4b pN0 with close vaginal margin 0.2 mm.  Uterus and ovaries negative for malignancy. palliative radiation to vaginal recurrence.  Finished 09/04/2020 01/19/21 recurrence with lung metastasis.-Palliative -FOLFIRI plus bevacizumab.  Irinotecan was dropped in November 2022 due to side effects. Negative for [id number]*28  CEA continues to progressively increase Labs reviewed and discussed with patient. Proceed with dose reduced FOLFIRI-[Irinotecan 100 mg/m2] / bevacizumab-currently she tolerates well .  CEA continues to progressively increase  Recommend short-term CT follow-up-plan mid June 2020  RAS wild type. Consider switch to irinotecan +panitumumab after progression .   Chemotherapy-induced diarrhea,   continue Imodium and lomotil as needed per instruction.continue cholestyramine 4g TID.  Symptoms are stable.  # chemotherapy induced neutropenia , patient will receive long-acting GCSF prophylaxis.   #Hypokalemia, potassium is stable.  Continue potassium chloride 20 mEq twice daily.  #History of pulmonary embolism, history of bilateral DVT Continue Eliquis 2.5 mg twice daily for anticoagulation prophylaxis.   Follow-up 2 weeks for next cycle of treatment.  Earlie Server, MD, PhD

## 2022-03-06 NOTE — Progress Notes (Signed)
Patietn here for follow up. Patient reports she was having pain to left leg but she thinks it was due to the way she was getting in and out of the care. Pain has resolved.

## 2022-03-06 NOTE — Progress Notes (Signed)
Urine protein pending. Per Janeann Merl RN per Dr. Tasia Catchings wait for Urine protein results prior to proceeding with Mvasi, okay to proceed with Pre-meds and Irinotecan/Leucovorin prior to urine results.

## 2022-03-06 NOTE — Telephone Encounter (Signed)
Please advised  

## 2022-03-06 NOTE — Patient Instructions (Signed)
[facility] CANCER CTR AT [address]  Discharge Instructions: Thank you for choosing [facility] to provide your oncology and hematology care.  If you have a lab appointment with the [facility], please go directly to the [facility] and check in at the registration area.  Wear comfortable clothing and clothing appropriate for easy access to any Portacath or PICC line.   We strive to give you quality time with your provider. You may need to reschedule your appointment if you arrive late (15 or more minutes).  Arriving late affects you and other patients whose appointments are after yours.  Also, if you miss three or more appointments without notifying the office, you may be dismissed from the clinic at the provider's discretion.      For prescription refill requests, have your pharmacy contact our office and allow 72 hours for refills to be completed.    Today you received the following chemotherapy and/or immunotherapy agents MVASI, Irinotecan, Leucovorin, & 5FU      To help prevent nausea and vomiting after your treatment, we encourage you to take your nausea medication as directed.  BELOW ARE SYMPTOMS THAT SHOULD BE REPORTED IMMEDIATELY: *FEVER GREATER THAN 100.4 F (38 C) OR HIGHER *CHILLS OR SWEATING *NAUSEA AND VOMITING THAT IS NOT CONTROLLED WITH YOUR NAUSEA MEDICATION *UNUSUAL SHORTNESS OF BREATH *UNUSUAL BRUISING OR BLEEDING *URINARY PROBLEMS (pain or burning when urinating, or frequent urination) *BOWEL PROBLEMS (unusual diarrhea, constipation, pain near the anus) TENDERNESS IN MOUTH AND THROAT WITH OR WITHOUT PRESENCE OF ULCERS (sore throat, sores in mouth, or a toothache) UNUSUAL RASH, SWELLING OR PAIN  UNUSUAL VAGINAL DISCHARGE OR ITCHING   Items with * indicate a potential emergency and should be followed up as soon as possible or go to the Emergency Department if any problems should occur.  Please show the CHEMOTHERAPY ALERT CARD or IMMUNOTHERAPY  ALERT CARD at check-in to the Emergency Department and triage nurse.  Should you have questions after your visit or need to cancel or reschedule your appointment, please contact [facility] CANCER [place] AT [address]  [phone] and follow the prompts.  Office hours are 8:00 a.m. to 4:30 p.m. Monday - Friday. Please note that voicemails left after 4:00 p.m. may not be returned until the following business day.  We are closed weekends and major holidays. You have access to a nurse at all times for urgent questions. Please call the main number to the clinic [phone] and follow the prompts.  For any non-urgent questions, you may also contact your provider using MyChart. We now offer e-Visits for anyone 52 and older to request care online for non-urgent symptoms. For details visit [email or web address].   Also download the MyChart app! Go to the app store, search "MyChart", open the app, select [facility], and log in with your MyChart username and password.  Due to Covid, a mask is required upon entering the hospital/clinic. If you do not have a mask, one will be given to you upon arrival. For doctor visits, patients may have 1 support person aged 58 or older with them. For treatment visits, patients cannot have anyone with them due to current Covid guidelines and our immunocompromised population.

## 2022-03-07 LAB — CEA: CEA: 1329 ng/mL — ABNORMAL HIGH (ref 0.0–4.7)

## 2022-03-08 ENCOUNTER — Ambulatory Visit
Admission: RE | Admit: 2022-03-08 | Discharge: 2022-03-08 | Disposition: A | Discharge status: Home / Self Care | Payer: Medicare Other | Source: Ambulatory Visit | Attending: Radiation Oncology | Admitting: Radiation Oncology

## 2022-03-08 ENCOUNTER — Inpatient Hospital Stay: Payer: Medicare Other

## 2022-03-08 ENCOUNTER — Encounter: Payer: Self-pay | Admitting: Radiation Oncology

## 2022-03-08 VITALS — BP 124/66 | HR 64 | Resp 18

## 2022-03-08 VITALS — BP 120/63 | HR 65 | Temp 97.5°F | Resp 16 | Ht 64.0 in | Wt 165.9 lb

## 2022-03-08 DIAGNOSIS — Z923 Personal history of irradiation: Secondary | ICD-10-CM | POA: Insufficient documentation

## 2022-03-08 DIAGNOSIS — R970 Elevated carcinoembryonic antigen [CEA]: Secondary | ICD-10-CM | POA: Insufficient documentation

## 2022-03-08 DIAGNOSIS — C20 Malignant neoplasm of rectum: Secondary | ICD-10-CM | POA: Insufficient documentation

## 2022-03-08 DIAGNOSIS — C799 Secondary malignant neoplasm of unspecified site: Secondary | ICD-10-CM

## 2022-03-08 DIAGNOSIS — R918 Other nonspecific abnormal finding of lung field: Secondary | ICD-10-CM | POA: Insufficient documentation

## 2022-03-08 DIAGNOSIS — R197 Diarrhea, unspecified: Secondary | ICD-10-CM | POA: Insufficient documentation

## 2022-03-08 DIAGNOSIS — D702 Other drug-induced agranulocytosis: Secondary | ICD-10-CM | POA: Diagnosis not present

## 2022-03-08 DIAGNOSIS — K7689 Other specified diseases of liver: Secondary | ICD-10-CM | POA: Insufficient documentation

## 2022-03-08 DIAGNOSIS — Z9221 Personal history of antineoplastic chemotherapy: Secondary | ICD-10-CM | POA: Diagnosis not present

## 2022-03-08 DIAGNOSIS — K219 Gastro-esophageal reflux disease without esophagitis: Secondary | ICD-10-CM | POA: Diagnosis not present

## 2022-03-08 DIAGNOSIS — T451X5A Adverse effect of antineoplastic and immunosuppressive drugs, initial encounter: Secondary | ICD-10-CM | POA: Diagnosis not present

## 2022-03-08 DIAGNOSIS — R609 Edema, unspecified: Secondary | ICD-10-CM | POA: Diagnosis not present

## 2022-03-08 DIAGNOSIS — K5900 Constipation, unspecified: Secondary | ICD-10-CM | POA: Diagnosis not present

## 2022-03-08 DIAGNOSIS — Z933 Colostomy status: Secondary | ICD-10-CM | POA: Insufficient documentation

## 2022-03-08 DIAGNOSIS — R634 Abnormal weight loss: Secondary | ICD-10-CM | POA: Diagnosis not present

## 2022-03-08 DIAGNOSIS — Z87891 Personal history of nicotine dependence: Secondary | ICD-10-CM | POA: Diagnosis not present

## 2022-03-08 DIAGNOSIS — Z79899 Other long term (current) drug therapy: Secondary | ICD-10-CM | POA: Diagnosis not present

## 2022-03-08 DIAGNOSIS — I10 Essential (primary) hypertension: Secondary | ICD-10-CM | POA: Diagnosis not present

## 2022-03-08 DIAGNOSIS — E876 Hypokalemia: Secondary | ICD-10-CM | POA: Diagnosis not present

## 2022-03-08 DIAGNOSIS — Z5189 Encounter for other specified aftercare: Secondary | ICD-10-CM | POA: Diagnosis not present

## 2022-03-08 DIAGNOSIS — Z86711 Personal history of pulmonary embolism: Secondary | ICD-10-CM | POA: Diagnosis not present

## 2022-03-08 DIAGNOSIS — Z86718 Personal history of other venous thrombosis and embolism: Secondary | ICD-10-CM | POA: Diagnosis not present

## 2022-03-08 DIAGNOSIS — Z5111 Encounter for antineoplastic chemotherapy: Secondary | ICD-10-CM | POA: Diagnosis not present

## 2022-03-08 DIAGNOSIS — E7800 Pure hypercholesterolemia, unspecified: Secondary | ICD-10-CM | POA: Diagnosis not present

## 2022-03-08 DIAGNOSIS — Z08 Encounter for follow-up examination after completed treatment for malignant neoplasm: Secondary | ICD-10-CM | POA: Diagnosis not present

## 2022-03-08 MED ORDER — PEGFILGRASTIM-BMEZ 6 MG/0.6ML ~~LOC~~ SOSY
6.0000 mg | PREFILLED_SYRINGE | Freq: Once | SUBCUTANEOUS | Status: AC
  Administered 2022-03-08: 6 mg via SUBCUTANEOUS
  Filled 2022-03-08: qty 0.6

## 2022-03-08 MED ORDER — HEPARIN SOD (PORK) LOCK FLUSH 100 UNIT/ML IV SOLN
500.0000 [IU] | Freq: Once | INTRAVENOUS | Status: AC | PRN
  Administered 2022-03-08: 500 [IU]
  Filled 2022-03-08: qty 5

## 2022-03-08 MED ORDER — SODIUM CHLORIDE 0.9% FLUSH
10.0000 mL | INTRAVENOUS | Status: DC | PRN
  Administered 2022-03-08: 10 mL
  Filled 2022-03-08: qty 10

## 2022-03-08 NOTE — Progress Notes (Signed)
Radiation Oncology Follow up Note  Name: Jacqueline Yoder   Date:   03/08/2022 MRN:  [id number] DOB: [birth year]    This 76 y.o. 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  Seen today in routine follow-up she is doing fairly well she specifically denies any increased lower urinary tract symptoms diarrhea or fatigue.  Her diarrhea has certainly improved over time..  She isCurrently on dose reduced FOLFIRI/bevacizumab which she is tolerating well.  Her CEA continues to progressively increase.  She also has stable scattered small pulmonary nodules.  On her recent CT scan last month.  Biliary distention and prior exam is mildly improved she also has an APR with left colostomy no other evidence to suggest recurrent disease.  COMPLICATIONS OF TREATMENT: none  FOLLOW UP COMPLIANCE: keeps appointments   PHYSICAL EXAM:  BP 120/63 (BP Location: Left Arm, Patient Position: Sitting, Cuff Size: Normal)   Pulse 65   Temp (!) 97.5 F (36.4 C) (Tympanic)   Resp 16   Ht '5\' 4"'$  (1.626 m) Comment: stated wt  Wt 165 lb 14.4 oz (75.3 kg)   BMI 28.48 kg/m  Patient is a functioning colostomy.  Well-developed well-nourished patient in NAD. HEENT reveals PERLA, EOMI, discs not visualized.  Oral cavity is clear. No oral mucosal lesions are identified. Neck is clear without evidence of cervical or supraclavicular adenopathy. Lungs are clear to A&P. Cardiac examination is essentially unremarkable with regular rate and rhythm without murmur rub or thrill. Abdomen is benign with no  organomegaly or masses noted. Motor sensory and DTR levels are equal and symmetric in the upper and lower extremities. Cranial nerves II through XII are grossly intact. Proprioception is intact. No peripheral adenopathy or edema is identified. No motor or sensory levels are noted. Crude visual fields are within normal range.  RADIOLOGY RESULTS: CT scan reviewed compatible with above-stated findings  PLAN: Present time patient is on maintenance chemotherapy under medical oncology's direction.  Unfortunately CEA continues to rise although with no overt evidence of gross recurrent disease.  I have asked to see her back in 6 months for follow-up.  Be happy to reevaluate her at any time should further radiation therapy be indicated.  I would like to take this opportunity to thank you for allowing me to participate in the care of your patient..Noreene Filbert MD

## 2022-03-17 MED FILL — Dexamethasone Sodium Phosphate Inj 100 MG/10ML: INTRAMUSCULAR | Qty: 1 | Status: AC

## 2022-03-20 ENCOUNTER — Inpatient Hospital Stay: Payer: Medicare Other | Attending: Oncology

## 2022-03-20 ENCOUNTER — Inpatient Hospital Stay: Payer: Medicare Other

## 2022-03-20 ENCOUNTER — Inpatient Hospital Stay (HOSPITAL_BASED_OUTPATIENT_CLINIC_OR_DEPARTMENT_OTHER): Payer: Medicare Other | Admitting: Oncology

## 2022-03-20 ENCOUNTER — Encounter: Payer: Self-pay | Admitting: Oncology

## 2022-03-20 VITALS — BP 130/51 | HR 71 | Temp 97.8°F | Resp 18 | Wt 161.1 lb

## 2022-03-20 DIAGNOSIS — D701 Agranulocytosis secondary to cancer chemotherapy: Secondary | ICD-10-CM | POA: Insufficient documentation

## 2022-03-20 DIAGNOSIS — K521 Toxic gastroenteritis and colitis: Secondary | ICD-10-CM | POA: Insufficient documentation

## 2022-03-20 DIAGNOSIS — Z86718 Personal history of other venous thrombosis and embolism: Secondary | ICD-10-CM

## 2022-03-20 DIAGNOSIS — C20 Malignant neoplasm of rectum: Secondary | ICD-10-CM | POA: Diagnosis not present

## 2022-03-20 DIAGNOSIS — T451X5A Adverse effect of antineoplastic and immunosuppressive drugs, initial encounter: Secondary | ICD-10-CM | POA: Diagnosis not present

## 2022-03-20 DIAGNOSIS — Z5111 Encounter for antineoplastic chemotherapy: Secondary | ICD-10-CM

## 2022-03-20 DIAGNOSIS — Z5189 Encounter for other specified aftercare: Secondary | ICD-10-CM | POA: Diagnosis not present

## 2022-03-20 DIAGNOSIS — Z86711 Personal history of pulmonary embolism: Secondary | ICD-10-CM | POA: Insufficient documentation

## 2022-03-20 DIAGNOSIS — D229 Melanocytic nevi, unspecified: Secondary | ICD-10-CM

## 2022-03-20 DIAGNOSIS — C7801 Secondary malignant neoplasm of right lung: Secondary | ICD-10-CM | POA: Diagnosis not present

## 2022-03-20 DIAGNOSIS — Z79899 Other long term (current) drug therapy: Secondary | ICD-10-CM | POA: Insufficient documentation

## 2022-03-20 DIAGNOSIS — C7802 Secondary malignant neoplasm of left lung: Secondary | ICD-10-CM | POA: Insufficient documentation

## 2022-03-20 DIAGNOSIS — Z95828 Presence of other vascular implants and grafts: Secondary | ICD-10-CM | POA: Diagnosis not present

## 2022-03-20 DIAGNOSIS — C799 Secondary malignant neoplasm of unspecified site: Secondary | ICD-10-CM

## 2022-03-20 DIAGNOSIS — E876 Hypokalemia: Secondary | ICD-10-CM | POA: Diagnosis not present

## 2022-03-20 LAB — COMPREHENSIVE METABOLIC PANEL
ALT: 16 U/L (ref 0–44)
AST: 26 U/L (ref 15–41)
Albumin: 3.9 g/dL (ref 3.5–5.0)
Alkaline Phosphatase: 72 U/L (ref 38–126)
Anion gap: 7 (ref 5–15)
BUN: 10 mg/dL (ref 8–23)
CO2: 27 mmol/L (ref 22–32)
Calcium: 9.4 mg/dL (ref 8.9–10.3)
Chloride: 105 mmol/L (ref 98–111)
Creatinine, Ser: 0.89 mg/dL (ref 0.44–1.00)
GFR, Estimated: >60 mL/min (ref >60)
Glucose, Bld: 92 mg/dL (ref 70–99)
Potassium: 4.1 mmol/L (ref 3.5–5.1)
Sodium: 139 mmol/L (ref 135–145)
Total Bilirubin: 0.2 mg/dL — ABNORMAL LOW (ref 0.3–1.2)
Total Protein: 7 g/dL (ref 6.5–8.1)

## 2022-03-20 LAB — PROTEIN, URINE, RANDOM: Total Protein, Urine: 17 mg/dL

## 2022-03-20 LAB — CBC WITH DIFFERENTIAL/PLATELET
Abs Immature Granulocytes: 0.1 10*3/uL — ABNORMAL HIGH (ref 0.00–0.07)
Basophils Absolute: 0 10*3/uL (ref 0.0–0.1)
Basophils Relative: 0 %
Eosinophils Absolute: 0.1 10*3/uL (ref 0.0–0.5)
Eosinophils Relative: 2 %
HCT: 37.4 % (ref 36.0–46.0)
Hemoglobin: 11.7 g/dL — ABNORMAL LOW (ref 12.0–15.0)
Immature Granulocytes: 2 %
Lymphocytes Relative: 19 %
Lymphs Abs: 1.3 10*3/uL (ref 0.7–4.0)
MCH: 30.3 pg (ref 26.0–34.0)
MCHC: 31.3 g/dL (ref 30.0–36.0)
MCV: 96.9 fL (ref 80.0–100.0)
Monocytes Absolute: 0.6 10*3/uL (ref 0.1–1.0)
Monocytes Relative: 9 %
Neutro Abs: 4.6 10*3/uL (ref 1.7–7.7)
Neutrophils Relative %: 68 %
Platelets: 200 10*3/uL (ref 150–400)
RBC: 3.86 MIL/uL — ABNORMAL LOW (ref 3.87–5.11)
RDW: 18.1 % — ABNORMAL HIGH (ref 11.5–15.5)
WBC: 6.8 10*3/uL (ref 4.0–10.5)
nRBC: 0 % (ref 0.0–0.2)

## 2022-03-20 MED ORDER — SODIUM CHLORIDE 0.9 % IV SOLN
750.0000 mg | Freq: Once | INTRAVENOUS | Status: AC
  Administered 2022-03-20: 750 mg via INTRAVENOUS
  Filled 2022-03-20: qty 37.5

## 2022-03-20 MED ORDER — SODIUM CHLORIDE 0.9 % IV SOLN
2400.0000 mg/m2 | INTRAVENOUS | Status: DC
  Administered 2022-03-20: 4300 mg via INTRAVENOUS
  Filled 2022-03-20: qty 86

## 2022-03-20 MED ORDER — SODIUM CHLORIDE 0.9 % IV SOLN
10.0000 mg | Freq: Once | INTRAVENOUS | Status: AC
  Administered 2022-03-20: 10 mg via INTRAVENOUS
  Filled 2022-03-20: qty 10

## 2022-03-20 MED ORDER — SODIUM CHLORIDE 0.9% FLUSH
10.0000 mL | Freq: Once | INTRAVENOUS | Status: AC
  Administered 2022-03-20: 10 mL via INTRAVENOUS
  Filled 2022-03-20: qty 10

## 2022-03-20 MED ORDER — SODIUM CHLORIDE 0.9 % IV SOLN
Freq: Once | INTRAVENOUS | Status: AC
  Filled 2022-03-20: qty 250

## 2022-03-20 MED ORDER — DIPHENHYDRAMINE HCL 50 MG/ML IJ SOLN
25.0000 mg | Freq: Every day | INTRAMUSCULAR | Status: DC | PRN
  Administered 2022-03-20: 25 mg via INTRAVENOUS

## 2022-03-20 MED ORDER — PALONOSETRON HCL INJECTION 0.25 MG/5ML
0.2500 mg | Freq: Once | INTRAVENOUS | Status: AC
  Administered 2022-03-20: 0.25 mg via INTRAVENOUS

## 2022-03-20 MED ORDER — HEPARIN SOD (PORK) LOCK FLUSH 100 UNIT/ML IV SOLN
500.0000 [IU] | Freq: Once | INTRAVENOUS | Status: DC
  Filled 2022-03-20: qty 5

## 2022-03-20 MED ORDER — ATROPINE SULFATE 1 MG/ML IV SOLN
0.4000 mg | Freq: Once | INTRAVENOUS | Status: AC
  Administered 2022-03-20: 0.4 mg via INTRAVENOUS

## 2022-03-20 MED ORDER — DIPHENHYDRAMINE HCL 50 MG/ML IJ SOLN
INTRAMUSCULAR | Status: AC
  Filled 2022-03-20: qty 1

## 2022-03-20 MED ORDER — SODIUM CHLORIDE 0.9 % IV SOLN
100.0000 mg/m2 | Freq: Once | INTRAVENOUS | Status: AC
  Administered 2022-03-20: 180 mg via INTRAVENOUS
  Filled 2022-03-20: qty 5

## 2022-03-20 MED ORDER — SODIUM CHLORIDE 0.9 % IV SOLN
350.0000 mg | Freq: Once | INTRAVENOUS | Status: AC
  Administered 2022-03-20: 350 mg via INTRAVENOUS
  Filled 2022-03-20: qty 14

## 2022-03-20 NOTE — Progress Notes (Signed)
Pt here for follow up. No new concerns voiced.   

## 2022-03-20 NOTE — Patient Instructions (Signed)
[facility] CANCER CTR AT [place]  Discharge Instructions: Thank you for choosing [facility] to provide your oncology and hematology care.  If you have a lab appointment with the [facility], please go directly to the [facility] and check in at the registration area.  Wear comfortable clothing and clothing appropriate for easy access to any Portacath or PICC line.   We strive to give you quality time with your provider. You may need to reschedule your appointment if you arrive late (15 or more minutes).  Arriving late affects you and other patients whose appointments are after yours.  Also, if you miss three or more appointments without notifying the office, you may be dismissed from the clinic at the provider's discretion.      For prescription refill requests, have your pharmacy contact our office and allow 72 hours for refills to be completed.    Today you received the following chemotherapy and/or immunotherapy agents: Irinotecan, Adrucil, Bevacizumab      To help prevent nausea and vomiting after your treatment, we encourage you to take your nausea medication as directed.  BELOW ARE SYMPTOMS THAT SHOULD BE REPORTED IMMEDIATELY: *FEVER GREATER THAN 100.4 F (38 C) OR HIGHER *CHILLS OR SWEATING *NAUSEA AND VOMITING THAT IS NOT CONTROLLED WITH YOUR NAUSEA MEDICATION *UNUSUAL SHORTNESS OF BREATH *UNUSUAL BRUISING OR BLEEDING *URINARY PROBLEMS (pain or burning when urinating, or frequent urination) *BOWEL PROBLEMS (unusual diarrhea, constipation, pain near the anus) TENDERNESS IN MOUTH AND THROAT WITH OR WITHOUT PRESENCE OF ULCERS (sore throat, sores in mouth, or a toothache) UNUSUAL RASH, SWELLING OR PAIN  UNUSUAL VAGINAL DISCHARGE OR ITCHING   Items with * indicate a potential emergency and should be followed up as soon as possible or go to the Emergency Department if any problems should occur.  Please show the CHEMOTHERAPY ALERT CARD or IMMUNOTHERAPY  ALERT CARD at check-in to the Emergency Department and triage nurse.  Should you have questions after your visit or need to cancel or reschedule your appointment, please contact [facility] CANCER [place] AT [place]  [phone] and follow the prompts.  Office hours are 8:00 a.m. to 4:30 p.m. Monday - Friday. Please note that voicemails left after 4:00 p.m. may not be returned until the following business day.  We are closed weekends and major holidays. You have access to a nurse at all times for urgent questions. Please call the main number to the clinic [phone] and follow the prompts.  For any non-urgent questions, you may also contact your provider using MyChart. We now offer e-Visits for anyone 75 and older to request care online for non-urgent symptoms. For details visit [email or web address].   Also download the MyChart app! Go to the app store, search "MyChart", open the app, select [facility], and log in with your MyChart username and password.  Due to Covid, a mask is required upon entering the hospital/clinic. If you do not have a mask, one will be given to you upon arrival. For doctor visits, patients may have 1 support person aged 75 or older with them. For treatment visits, patients cannot have anyone with them due to current Covid guidelines and our immunocompromised population.

## 2022-03-20 NOTE — Progress Notes (Signed)
Hematology/Oncology Progress note [phone] [phone]      Patient Care Team: Olin Hauser, DO as PCP - General (Family Medicine) Clent Jacks, RN as Registered Nurse Earlie Server, MD as Consulting Physician (Hematology and Oncology)  CHIEF COMPLAINTS/REASON FOR VISIT:  Follow up for rectal cancer  HISTORY OF PRESENTING ILLNESS:  Patient initially presented with complaints of postmenopausal bleeding on 08/16/2018.  History of was menopausal vaginal bleeding in 2016 which resulted in cervical polypectomy.  Pathology 02/04/2015 showed cervical polyp, consistent with benign endometrial polyp.  [name] after polypectomy due to anxiety associated with pelvic exams.  pelvic exam on 08/16/2018 reviewed cervical abnormality and from enlarged uterus. Seen by Dr. Marcelline Mates on 10/29/2018.  Endometrial biopsy and a Pap smear was performed. 10/29/2018 Pap smear showed adenocarcinoma, favor endometrial origin. 10/29/2018 endometrial biopsy showed endometrioid carcinoma, FIGO grade 1.  10/29/2018- TA & TV Ultrasound revealed: Anteverted uterus measuring 8.7 x 5.6 x 6.4 cm without evidence of focal masses.  The endometrium measuring 24.1 mm (thickened) and heterogeneous.  Right and left ovaries not visualized.  No adnexal masses identified.  No free fluid in cul-de-sac.  Patient was seen by Dr. Theora Gianotti in clinic on 11/13/2018.  Cervical exam reveals 2 cm exophytic irregular mass consistent with malignancy.   11/19/2018 CT chest abdomen pelvis with contrast showed thickened endometrium with some irregularity compatible with the provided diagnosis of endometrial malignancy.  There is a mildly prominent left inguinal node 1.4 cm.  Patient was seen by Dr. Fransisca Connors on 11/20/2018 and left groin lymph node biopsy was recommended.  11/26/2018 patient underwent left inguinal lymph node biopsy. Pathology showed metastatic adenocarcinoma consistent with colorectal origin.  CDX  2+.  Case was discussed on tumor board.  Recommend colonoscopy for further evaluation.  Patient reports significant weight loss 30 pounds over the last year.  Chronic vaginal spotting. Change of bowel habits the past few months.  More constipated.  Family history positive for brother who has colon cancer prostate cancer.  patient has underwent colonoscopy on 12/03/2018 which reviewed a nonobstructing large mass in the rectum.  Also chronic fistula.  Mass was not circumferential.  This was biopsied with a cold forceps for histology.  Pathology came back hyperplastic polyp negative for dysplasia and malignancy. [name], patient underwent flex sigmoidoscopy on 12/06/2018 with rebiopsy of the rectal mass. This time biopsy results came back positive for invasive colorectal adenocarcinoma, moderately differentiated. Immunotherapy for nearly mismatch repair protein (MMR ) was performed.  There is no loss of MMR expression.  low probability of MSI high.   # Seen by [name] for evaluation of resectability for rectal cancer. In addition, she also had a second opinion with [facility] pathology where her endometrial biopsy pathology was changed to  adenocarcinoma, consistent with colorectal primary.   [name]. She has home health that has been assisting with ostomy care  Patient was also evaluated by [facility] oncology.  Recommendation is to proceed with TNT with concurrent chemoradiation followed by neoadjuvant chemotherapy followed by surgical resection. Patient prefers to have treatment done locally with [facility].   # Oncology Treatment:  02/03/2019- 03/19/2019  concurrent Xeloda and radiation.  Xeloda dose 82m /m2 BID - rounded to 16531mBID- on days of radiation. 04/09/2019, started on FOLFOX with bolus early.  Omitted.  07/16/2019 finished 8 cycles of FOLFOX. 09/17/19 APR/posterior vaginectomy/TAH/BSO/VY-flap pT4b pN0 with close vaginal margin 0.2  mm.  Uterus and ovaries negative  for malignancy. Patient reports bilateral lower extremity numbness and tingling, intermittent, left worse than right. She has lost a lot of weight since her APR surgery.   #Family history with half brother having's history of colon cancer prostate cancer.  Personal history of colorectal cancer.  Patient has not decided if she wants genetic testing.    # history of PE( 01/13/2020)  in the bilateral lower extremity DVT (01/13/2020).   She finishes 6 months of anticoagulation with Eliquis 5 mg twice daily. Now switched to Eliquis 2.5 mg twice daily..  # She has now developed recurrent disease. #06/30/20  vaginal introitus mass biopsied. Pathology is consistent with metastatic colorectal adenocarcinoma I have discussed with [name]  Dr. Hester Mates and the mass is not resectable. Patient has also had colonoscopy by Dr. Vicente Males yesterday. Normal examination. # 07/16/2020 cycle 1 FOLFIRI  # 07/20/2020 PET scan was done for further evaluation, images are consistent with local recurrence, no distant metastasis. #Discussed with radiation oncology Dr. Baruch Gouty will recommends concurrent chemotherapy and radiation. 08/02/2020-08/16/2020, patient starts radiation.  Xeloda was held due to neutropenia 08/17/2020,-09/06/2020 Xeloda 1500 mg twice daily concurrently with radiation  01/31/21 started on FOLFIRI + Bev 05/18/2021 CT chest abdomen pelvis showed Previously noted enlargement of bilateral inguinal lymph nodes is resolved, consistent with treatment response of nodal metastatic disease. Interval decrease in size of multiple small bilateral pulmonary nodules, consistent with treatment response of pulmonary metastatic disease. No evidence of new metastatic disease. 05/24/2021 - 08/30/2021, continued on FOLFIRI plus bevacizumab.  Irinotecan dose was reduced, eventually 100mg /m2  09/02/2021, CT chest abdomen pelvis without contrast Showed small bilateral pulmonary nodules, unchanged.   Stable metastatic disease.  No noncontrast evidence of new metastatic disease in the chest abdomen pelvis.  Small parastomal hernia.  Enlargement of main pulmonary artery.  Coronary artery disease.  09/13/2021, maintenance 5-FU/bevacizumab 11/28/2021, 5-FU/Irinotecan/bevacizumab.  Irinotecan 100 mg/m2 was added back due to progressively increasing CEA.  INTERVAL HISTORY ALAYSIA LIGHTLE is a 76 y.o. female who has above history reviewed by me presents for follow-up of rectal cancer. Currently on dose reduced FOLFIRI/bevacizumab  overall she tolerates well.  Diarrhea is manageable, mild symptpoms. Improved with anti diarrhea PRN.  She takes Imodium/Lomotil as needed.  Also on cholestyramine She was accompanied by her daughter today.  No new complaints     Review of Systems  Constitutional:  Positive for fatigue. Negative for appetite change, chills, fever and unexpected weight change.  HENT:   Negative for hearing loss and voice change.   Eyes:  Negative for eye problems.  Respiratory:  Negative for chest tightness and cough.   Cardiovascular:  Negative for chest pain.  Gastrointestinal:  Negative for abdominal distention, abdominal pain, blood in stool, constipation, diarrhea and nausea.  Endocrine: Negative for hot flashes.  Genitourinary:  Negative for difficulty urinating and frequency.   Musculoskeletal:  Negative for arthralgias.  Skin:  Negative for itching and rash.  Neurological:  Negative for extremity weakness and numbness.  Hematological:  Negative for adenopathy.  Psychiatric/Behavioral:  Negative for confusion.    MEDICAL HISTORY:  Past Medical History:  Diagnosis Date   Allergy    Arthritis    Blood clot in vein    Family history of colon cancer    GERD (gastroesophageal reflux disease)    Hypercholesteremia    Hypertension    Hypertension    Lower extremity edema    Personal history of chemotherapy    Rectal cancer ([place]) 12/2018   Urinary incontinence  SURGICAL HISTORY: Past Surgical History:  Procedure Laterality Date   ABDOMINAL HYSTERECTOMY     CHOLECYSTECTOMY  1971   COLONOSCOPY WITH PROPOFOL N/A 12/03/2018   Procedure: COLONOSCOPY WITH PROPOFOL;  Surgeon: Lucilla Lame, MD;  Location: [facility] ENDOSCOPY;  Service: Endoscopy;  Laterality: N/A;   COLONOSCOPY WITH PROPOFOL N/A 07/15/2020   Procedure: COLONOSCOPY WITH PROPOFOL;  Surgeon: [name], MD;  Location: [facility] ENDOSCOPY;  Service: Gastroenterology;  Laterality: N/A;   FLEXIBLE SIGMOIDOSCOPY N/A 12/06/2018   Procedure: FLEXIBLE SIGMOIDOSCOPY;  Surgeon: [name], MD;  Location: [facility] ENDOSCOPY;  Service: Endoscopy;  Laterality: N/A;   LAPAROSCOPIC COLOSTOMY  01/06/2019   PORTACATH PLACEMENT N/A 04/03/2019   Procedure: INSERTION PORT-A-CATH;  Surgeon: Jules Husbands, MD;  Location: [facility] ORS;  Service: General;  Laterality: N/A;    SOCIAL HISTORY: Social History   Socioeconomic History   Marital status: Widowed    Spouse name: Not on file   Number of children: Not on file   Years of education: Not on file   Highest education level: Not on file  Occupational History   Not on file  Tobacco Use   Smoking status: Former    Types: Cigarettes    Quit date: 12/02/1977    Years since quitting: 44.3   Smokeless tobacco: Former  Scientific laboratory technician Use: Never used  Substance and Sexual Activity   Alcohol use: Never   Drug use: Never   Sexual activity: Not Currently    Birth control/protection: None  Other Topics Concern   Not on file  Social History Narrative   Lives with daughter   Social Determinants of Health   Financial Resource Strain: Low Risk    Difficulty of Paying Living Expenses: Not hard at all  Food Insecurity: No Food Insecurity   Worried About Charity fundraiser in the Last Year: Never true   [place] in the Last Year: Never true  Transportation Needs: No Transportation Needs   Lack of Transportation (Medical): No   Lack of Transportation  (Non-Medical): No  Physical Activity: Inactive   Days of Exercise per Week: 0 days   Minutes of Exercise per Session: 0 min  Stress: No Stress Concern Present   Feeling of Stress : Not at all  Social Connections: Not on file  Intimate Partner Violence: Not on file    FAMILY HISTORY: Family History  Problem Relation Age of Onset   Colon cancer Brother 59       exposure to chemicals [place]   Hypertension Mother    Stroke Mother    Kidney failure Father    Breast cancer Neg Hx    Ovarian cancer Neg Hx     ALLERGIES:  is allergic to sulfamethoxazole-trimethoprim.  MEDICATIONS:  Current Outpatient Medications  Medication Sig Dispense Refill   apixaban (ELIQUIS) 2.5 MG TABS tablet Take 1 tablet (2.5 mg total) by mouth 2 (two) times daily. 60 tablet 0   Cholecalciferol (VITAMIN D3) 2000 units capsule Take 2,000 Units by mouth daily.     cholestyramine (QUESTRAN) 4 g packet Take 1 packet (4 g total) by mouth 3 (three) times daily. 90 each 1   diclofenac sodium (VOLTAREN) 1 % GEL Apply 2 g topically 4 (four) times daily as needed (joint pain).  11   diphenoxylate-atropine (LOMOTIL) 2.5-0.025 MG tablet Take 1 tablet by mouth 4 (four) times daily as needed for diarrhea or loose stools. 30 tablet 1   fluticasone (FLONASE) 50 MCG/ACT nasal spray  USE 1 SPRAY IN EACH NOSTRIL ONCE DAILY 16 g 3   gabapentin (NEURONTIN) 100 MG capsule Take 1 capsule (100 mg total) by mouth at bedtime. 30 capsule 0   ipratropium (ATROVENT) 0.03 % nasal spray Place 1 spray into both nostrils 2 (two) times daily. 30 mL 2   lidocaine-prilocaine (EMLA) cream Apply 1 application. topically as needed. 30 g 6   loperamide (IMODIUM) 2 MG capsule Take 1 capsule (2 mg total) by mouth See admin instructions. With onset of loose stool, take $RemoveBef'4mg'pjOtNHXhrt$  followed by $RemoveBef'2mg'qpxaGDmVPh$  every 2 hours,  Maximum: 16 mg/day 120 capsule 1   loratadine (CLARITIN) 10 MG tablet Take 10 mg by mouth daily.     Multiple Vitamins-Minerals (ONE-A-DAY WOMENS 50  PLUS PO) Take 1 tablet by mouth daily.      ondansetron (ZOFRAN) 8 MG tablet Take 1 tablet (8 mg total) by mouth 2 (two) times daily as needed for refractory nausea / vomiting. Start on day 3 after chemotherapy. 30 tablet 1   potassium chloride SA (KLOR-CON M) 20 MEQ tablet Take 2 tablets (40 mEq total) by mouth 2 (two) times daily. 120 tablet 1   prochlorperazine (COMPAZINE) 10 MG tablet Take 1 tablet (10 mg total) by mouth every 6 (six) hours as needed (NAUSEA). 30 tablet 1   simvastatin (ZOCOR) 40 MG tablet TAKE 1 TABLET BY MOUTH AT  BEDTIME 90 tablet 1   triamterene-hydrochlorothiazide (DYAZIDE) 37.5-25 MG capsule TAKE 1 CAPSULE BY MOUTH  DAILY 90 capsule 1   zinc gluconate 50 MG tablet Take 50 mg by mouth daily.     No current facility-administered medications for this visit.   Facility-Administered Medications Ordered in Other Visits  Medication Dose Route Frequency Provider Last Rate Last Admin   bevacizumab-awwb (MVASI) 350 mg in sodium chloride 0.9 % 100 mL chemo infusion  350 mg Intravenous Once Earlie Server, MD       fluorouracil (ADRUCIL) 4,300 mg in sodium chloride 0.9 % 64 mL chemo infusion  2,400 mg/m2 (Order-Specific) Intravenous 1 day or 1 dose Earlie Server, MD       heparin lock flush 100 unit/mL  500 Units Intravenous Once Earlie Server, MD       irinotecan (CAMPTOSAR) 180 mg in sodium chloride 0.9 % 500 mL chemo infusion  100 mg/m2 (Order-Specific) Intravenous Once Earlie Server, MD 339 mL/hr at 03/20/22 1035 180 mg at 03/20/22 1035   leucovorin 750 mg in sodium chloride 0.9 % 250 mL infusion  750 mg Intravenous Once Earlie Server, MD 192 mL/hr at 03/20/22 1032 750 mg at 03/20/22 1032     PHYSICAL EXAMINATION: ECOG PERFORMANCE STATUS: 1 - Symptomatic but completely ambulatory  Physical Exam Constitutional:      General: She is not in acute distress. HENT:     Head: Normocephalic and atraumatic.  Eyes:     General: No scleral icterus. Cardiovascular:     Rate and Rhythm: Normal rate and  regular rhythm.     Heart sounds: Normal heart sounds.  Pulmonary:     Effort: Pulmonary effort is normal. No respiratory distress.     Breath sounds: No wheezing.  Abdominal:     General: Bowel sounds are normal. There is no distension.     Palpations: Abdomen is soft.     Comments: + Colostomy bag   Musculoskeletal:        General: No deformity. Normal range of motion.     Cervical back: Normal range of motion and neck supple.  Skin:    General: Skin is warm and dry.     Findings: No erythema or rash.  Neurological:     Mental Status: She is alert and oriented to person, place, and time. Mental status is at baseline.     Cranial Nerves: No cranial nerve deficit.     Coordination: Coordination normal.      LABORATORY DATA:  I have reviewed the data as listed Lab Results  Component Value Date   WBC 6.8 03/20/2022   HGB 11.7 (L) 03/20/2022   HCT 37.4 03/20/2022   MCV 96.9 03/20/2022   PLT 200 03/20/2022   Recent Labs    02/20/22 0804 03/06/22 0822 03/20/22 0814  NA 137 138 139  K 4.1 4.3 4.1  CL 103 104 105  CO2 $Re'29 28 27  'hYj$ GLUCOSE 102* 97 92  BUN $Re'13 14 10  'fyH$ CREATININE 0.96 0.90 0.89  CALCIUM 9.3 9.1 9.4  GFRNONAA >60 >60 >60  PROT 6.9 6.9 7.0  ALBUMIN 4.1 3.9 3.9  AST $Re'26 26 26  'jLQ$ ALT $R'13 14 16  'vz$ ALKPHOS 71 71 72  BILITOT 0.4 0.6 0.2*    Iron/TIBC/Ferritin/ %Sat    Component Value Date/Time   IRON 40 06/23/2020 1124   TIBC 246 (L) 06/23/2020 1124   FERRITIN 338 (H) 06/23/2020 1124   IRONPCTSAT 16 06/23/2020 1124     RADIOGRAPHIC STUDIES: I have personally reviewed the radiological images as listed and agreed with the findings in the report. CT CHEST ABDOMEN PELVIS W CONTRAST  Result Date: 01/20/2022 CLINICAL DATA:  Restaging rectal cancer.  Ongoing chemotherapy. * Tracking Code: BO * EXAM: CT CHEST, ABDOMEN, AND PELVIS WITH CONTRAST TECHNIQUE: Multidetector CT imaging of the chest, abdomen and pelvis was performed following the standard protocol during bolus  administration of intravenous contrast. RADIATION DOSE REDUCTION: This exam was performed according to the departmental dose-optimization program which includes automated exposure control, adjustment of the mA and/or kV according to patient size and/or use of iterative reconstruction technique. CONTRAST:  16mL OMNIPAQUE IOHEXOL 300 MG/ML  SOLN COMPARISON:  Multiple exams, including 11/01/2021 FINDINGS: CT CHEST FINDINGS Cardiovascular: Right Port-A-Cath tip: Cavoatrial junction. Mildly prominent main pulmonary artery. Mediastinum/Nodes: Small left supraclavicular lymph nodes measuring up to 0.5 cm in short axis on image 8 series 2, stable. No current pathologic adenopathy in the chest. Lungs/Pleura: Scattered stable bilateral pulmonary nodules. An index 4 by 3 mm left upper lobe nodule previously measured 4 by 3 mm when measured in the same fashion. An index 6 by 4 by 6 mm right middle lobe pulmonary nodule is stable. No new pulmonary nodules are identified. Stable mild scarring posteriorly in the right lower lobe and inferiorly in the right middle lobe. Musculoskeletal: Unremarkable CT ABDOMEN PELVIS FINDINGS Hepatobiliary: Gallbladder absent. Mild but improved biliary dilatation, common bile duct currently 0.7 cm on image 61 series 2, formerly 0.9 cm at this location. No focal hepatic abnormality observed. Pancreas: Borderline dorsal pancreatic duct dilatation, improved from prior. Spleen: Unremarkable Adrenals/Urinary Tract: No change in bilateral renal cysts. Some of the hypodense renal lesions are too small to characterize. In general, such lesions are not felt to warrant further workup unless there is a specific renal clinical concern. Adrenal glands unremarkable. Stomach/Bowel: Prior APR with left colostomy. The wall thickening in segments of the large bowel is probably attributable to nondistention. Small amount of fluid signal along the fatty tissues at the ostomy site, as before. Pelvic floor laxity with  bulging small bowel loops posteriorly along the  pelvic floor. Vascular/Lymphatic: Atherosclerosis is present, including aortoiliac atherosclerotic disease. No pathologic adenopathy. Reproductive: Prior hysterectomy.  Adnexa unremarkable. Other: No supplemental non-categorized findings. Musculoskeletal: Lumbar spondylosis and degenerative disc disease. IMPRESSION: 1. Stable scattered small pulmonary nodules. 2. The biliary dilatation shown on the prior exam is mildly improved. 3. Prior APR with left colostomy. As before there is a small amount of fluid density in the peristomal adipose tissues. 4.  Aortic Atherosclerosis (ICD10-I70.0). 5. Pelvic floor laxity with bulging of small bowel loops along the pelvic floor. 6. Mildly prominent main pulmonary artery may indicate pulmonary arterial hypertension. Electronically Signed   By: Van Clines M.D.   On: 01/20/2022 10:32   MM 3D SCREEN BREAST BILATERAL  Result Date: 01/16/2022 CLINICAL DATA:  Screening. EXAM: DIGITAL SCREENING BILATERAL MAMMOGRAM WITH TOMOSYNTHESIS AND CAD TECHNIQUE: Bilateral screening digital craniocaudal and mediolateral oblique mammograms were obtained. Bilateral screening digital breast tomosynthesis was performed. The images were evaluated with computer-aided detection. COMPARISON:  Previous exam(s). ACR Breast Density Category b: There are scattered areas of fibroglandular density. FINDINGS: There are no findings suspicious for malignancy. IMPRESSION: No mammographic evidence of malignancy. A result letter of this screening mammogram will be mailed directly to the patient. RECOMMENDATION: Screening mammogram in one year. (Code:SM-B-01Y) BI-RADS CATEGORY  1: Negative. Electronically Signed   By: Lovey Newcomer M.D.   On: 01/16/2022 14:16      CT CHEST ABDOMEN PELVIS W CONTRAST  Result Date: 01/20/2022 CLINICAL DATA:  Restaging rectal cancer.  Ongoing chemotherapy. * Tracking Code: BO * EXAM: CT CHEST, ABDOMEN, AND PELVIS WITH CONTRAST  TECHNIQUE: Multidetector CT imaging of the chest, abdomen and pelvis was performed following the standard protocol during bolus administration of intravenous contrast. RADIATION DOSE REDUCTION: This exam was performed according to the departmental dose-optimization program which includes automated exposure control, adjustment of the mA and/or kV according to patient size and/or use of iterative reconstruction technique. CONTRAST:  5mL OMNIPAQUE IOHEXOL 300 MG/ML  SOLN COMPARISON:  Multiple exams, including 11/01/2021 FINDINGS: CT CHEST FINDINGS Cardiovascular: Right Port-A-Cath tip: Cavoatrial junction. Mildly prominent main pulmonary artery. Mediastinum/Nodes: Small left supraclavicular lymph nodes measuring up to 0.5 cm in short axis on image 8 series 2, stable. No current pathologic adenopathy in the chest. Lungs/Pleura: Scattered stable bilateral pulmonary nodules. An index 4 by 3 mm left upper lobe nodule previously measured 4 by 3 mm when measured in the same fashion. An index 6 by 4 by 6 mm right middle lobe pulmonary nodule is stable. No new pulmonary nodules are identified. Stable mild scarring posteriorly in the right lower lobe and inferiorly in the right middle lobe. Musculoskeletal: Unremarkable CT ABDOMEN PELVIS FINDINGS Hepatobiliary: Gallbladder absent. Mild but improved biliary dilatation, common bile duct currently 0.7 cm on image 61 series 2, formerly 0.9 cm at this location. No focal hepatic abnormality observed. Pancreas: Borderline dorsal pancreatic duct dilatation, improved from prior. Spleen: Unremarkable Adrenals/Urinary Tract: No change in bilateral renal cysts. Some of the hypodense renal lesions are too small to characterize. In general, such lesions are not felt to warrant further workup unless there is a specific renal clinical concern. Adrenal glands unremarkable. Stomach/Bowel: Prior APR with left colostomy. The wall thickening in segments of the large bowel is probably attributable  to nondistention. Small amount of fluid signal along the fatty tissues at the ostomy site, as before. Pelvic floor laxity with bulging small bowel loops posteriorly along the pelvic floor. Vascular/Lymphatic: Atherosclerosis is present, including aortoiliac atherosclerotic disease. No pathologic adenopathy. Reproductive: Prior  hysterectomy.  Adnexa unremarkable. Other: No supplemental non-categorized findings. Musculoskeletal: Lumbar spondylosis and degenerative disc disease. IMPRESSION: 1. Stable scattered small pulmonary nodules. 2. The biliary dilatation shown on the prior exam is mildly improved. 3. Prior APR with left colostomy. As before there is a small amount of fluid density in the peristomal adipose tissues. 4.  Aortic Atherosclerosis (ICD10-I70.0). 5. Pelvic floor laxity with bulging of small bowel loops along the pelvic floor. 6. Mildly prominent main pulmonary artery may indicate pulmonary arterial hypertension. Electronically Signed   By: Van Clines M.D.   On: 01/20/2022 10:32   MM 3D SCREEN BREAST BILATERAL  Result Date: 01/16/2022 CLINICAL DATA:  Screening. EXAM: DIGITAL SCREENING BILATERAL MAMMOGRAM WITH TOMOSYNTHESIS AND CAD TECHNIQUE: Bilateral screening digital craniocaudal and mediolateral oblique mammograms were obtained. Bilateral screening digital breast tomosynthesis was performed. The images were evaluated with computer-aided detection. COMPARISON:  Previous exam(s). ACR Breast Density Category b: There are scattered areas of fibroglandular density. FINDINGS: There are no findings suspicious for malignancy. IMPRESSION: No mammographic evidence of malignancy. A result letter of this screening mammogram will be mailed directly to the patient. RECOMMENDATION: Screening mammogram in one year. (Code:SM-B-01Y) BI-RADS CATEGORY  1: Negative. Electronically Signed   By: Lovey Newcomer M.D.   On: 01/16/2022 14:16     ASSESSMENT & PLAN:  1. Metastatic adenocarcinoma ([place])   2. Encounter  for antineoplastic chemotherapy   3. Rectal cancer ([place])   4. History of pulmonary embolism   5. History of DVT (deep vein thrombosis)   6. Chemotherapy induced neutropenia ([facility])   7. Port-A-Cath in place   8. Skin mole    Cancer Staging  Rectal cancer [facility]) Staging form: Colon and Rectum, AJCC 8th Edition - Clinical stage from 01/23/2019: Stage IIIC (cT4b, cN1a, cM0) - Signed by Earlie Server, MD on 01/23/2019 - Pathologic stage from 10/06/2019: Stage IIC (ypT4b, pN0, cM0) - Signed by Earlie Server, MD on 10/06/2019 - Pathologic: Stage Unknown (rpTX, pNX, cM1) - Signed by Earlie Server, MD on 01/31/2021  #History of stage IIIC Rectal cancer, s/p TNT, followed by 09/17/19 APR/posterior vaginectomy/TAH/BSO/VY-flap, pT4b pN0 with close vaginal margin 0.2 mm.  Uterus and ovaries negative for malignancy. palliative radiation to vaginal recurrence.  Finished 09/04/2020 01/19/21 recurrence with lung metastasis.-Palliative -FOLFIRI plus bevacizumab.  Irinotecan was dropped in November 2022 due to side effects. Negative for UGT1A1*28  CEA continues to progressively increase, other than that. Clinically she is doing well, tolerating treatment. CT findsings stable.  Labs are reviewed and discussed with patient. Proceed with dose reduced FOLFIRI-[Irinotecan 100 mg/m2] / bevacizumab-currently she tolerates well Recommend short-term CT follow-up- July 2023.   RAS wild type. Consider switch to irinotecan +panitumumab after progression .   Chemotherapy-induced diarrhea,   continue Imodium and lomotil as needed per instruction.continue cholestyramine 4g TID.  Symptoms are stable.  # chemotherapy induced neutropenia , patient will receive long-acting GCSF  Ziextenzo prophylaxis on D3  #Hypokalemia, potassium is stable.  Continue potassium chloride 20 mEq twice daily.  #History of pulmonary embolism, history of bilateral DVT Continue  Eliquis 2.5 mg twice daily for anticoagulation prophylaxis.   Follow-up 2 weeks for next  cycle of treatment.  Earlie Server, MD, PhD

## 2022-03-21 ENCOUNTER — Other Ambulatory Visit: Payer: Self-pay | Admitting: Oncology

## 2022-03-21 DIAGNOSIS — Z7901 Long term (current) use of anticoagulants: Secondary | ICD-10-CM

## 2022-03-21 DIAGNOSIS — I825Z3 Chronic embolism and thrombosis of unspecified deep veins of distal lower extremity, bilateral: Secondary | ICD-10-CM

## 2022-03-21 LAB — CEA: CEA: 1178 ng/mL — ABNORMAL HIGH (ref 0.0–4.7)

## 2022-03-22 ENCOUNTER — Encounter: Payer: Self-pay | Admitting: Oncology

## 2022-03-22 ENCOUNTER — Inpatient Hospital Stay: Payer: Medicare Other

## 2022-03-22 VITALS — BP 141/80 | HR 74 | Resp 18

## 2022-03-22 DIAGNOSIS — C20 Malignant neoplasm of rectum: Secondary | ICD-10-CM

## 2022-03-22 DIAGNOSIS — Z79899 Other long term (current) drug therapy: Secondary | ICD-10-CM | POA: Diagnosis not present

## 2022-03-22 DIAGNOSIS — Z86711 Personal history of pulmonary embolism: Secondary | ICD-10-CM | POA: Diagnosis not present

## 2022-03-22 DIAGNOSIS — C7801 Secondary malignant neoplasm of right lung: Secondary | ICD-10-CM | POA: Diagnosis not present

## 2022-03-22 DIAGNOSIS — Z86718 Personal history of other venous thrombosis and embolism: Secondary | ICD-10-CM | POA: Diagnosis not present

## 2022-03-22 DIAGNOSIS — C799 Secondary malignant neoplasm of unspecified site: Secondary | ICD-10-CM

## 2022-03-22 DIAGNOSIS — K521 Toxic gastroenteritis and colitis: Secondary | ICD-10-CM | POA: Diagnosis not present

## 2022-03-22 DIAGNOSIS — E876 Hypokalemia: Secondary | ICD-10-CM | POA: Diagnosis not present

## 2022-03-22 DIAGNOSIS — Z5189 Encounter for other specified aftercare: Secondary | ICD-10-CM | POA: Diagnosis not present

## 2022-03-22 DIAGNOSIS — C7802 Secondary malignant neoplasm of left lung: Secondary | ICD-10-CM | POA: Diagnosis not present

## 2022-03-22 DIAGNOSIS — Z5111 Encounter for antineoplastic chemotherapy: Secondary | ICD-10-CM | POA: Diagnosis not present

## 2022-03-22 DIAGNOSIS — D701 Agranulocytosis secondary to cancer chemotherapy: Secondary | ICD-10-CM | POA: Diagnosis not present

## 2022-03-22 MED ORDER — SODIUM CHLORIDE 0.9% FLUSH
10.0000 mL | INTRAVENOUS | Status: DC | PRN
  Administered 2022-03-22: 10 mL
  Filled 2022-03-22: qty 10

## 2022-03-22 MED ORDER — HEPARIN SOD (PORK) LOCK FLUSH 100 UNIT/ML IV SOLN
500.0000 [IU] | Freq: Once | INTRAVENOUS | Status: AC | PRN
  Administered 2022-03-22: 500 [IU]
  Filled 2022-03-22: qty 5

## 2022-03-22 MED ORDER — PEGFILGRASTIM-BMEZ 6 MG/0.6ML ~~LOC~~ SOSY
6.0000 mg | PREFILLED_SYRINGE | Freq: Once | SUBCUTANEOUS | Status: AC
  Administered 2022-03-22: 6 mg via SUBCUTANEOUS
  Filled 2022-03-22: qty 0.6

## 2022-03-23 ENCOUNTER — Other Ambulatory Visit: Payer: Self-pay | Admitting: Oncology

## 2022-03-23 DIAGNOSIS — Z7901 Long term (current) use of anticoagulants: Secondary | ICD-10-CM

## 2022-03-23 DIAGNOSIS — I825Z3 Chronic embolism and thrombosis of unspecified deep veins of distal lower extremity, bilateral: Secondary | ICD-10-CM

## 2022-03-24 ENCOUNTER — Encounter: Payer: Self-pay | Admitting: Oncology

## 2022-03-28 ENCOUNTER — Ambulatory Visit: Payer: Medicare Other | Admitting: Podiatry

## 2022-03-28 DIAGNOSIS — M79674 Pain in right toe(s): Secondary | ICD-10-CM

## 2022-03-28 DIAGNOSIS — D689 Coagulation defect, unspecified: Secondary | ICD-10-CM | POA: Diagnosis not present

## 2022-03-28 DIAGNOSIS — M79675 Pain in left toe(s): Secondary | ICD-10-CM

## 2022-03-28 DIAGNOSIS — B351 Tinea unguium: Secondary | ICD-10-CM | POA: Diagnosis not present

## 2022-03-28 NOTE — Progress Notes (Signed)
  Subjective:  Patient ID: Jacqueline Yoder, female    DOB: March 29, 1946,  MRN: [id number]  Chief Complaint  Patient presents with   Nail Problem   76 y.o. female returns for the above complaint.  Patient presents with thickened elongated dystrophic toenails x10.  Patient states painful to touch.  She is not able to debride herself.  She would like for me to do it.  She denies any other acute complaints.  She states that she is on Eliquis from previous history of PE.  Objective:  There were no vitals filed for this visit. Podiatric Exam: Vascular: dorsalis pedis and posterior tibial pulses are palpable bilateral. Capillary return is immediate. Temperature gradient is WNL. Skin turgor WNL  Sensorium: Normal Semmes Weinstein monofilament test. Normal tactile sensation bilaterally. Nail Exam: Pt has thick disfigured discolored nails with subungual debris noted bilateral entire nail hallux through fifth toenails.  Pain on palpation to the nails. Ulcer Exam: There is no evidence of ulcer or pre-ulcerative changes or infection. Orthopedic Exam: Muscle tone and strength are WNL. No limitations in general ROM. No crepitus or effusions noted. HAV  B/L.  Hammer toes 2-5  B/L. Skin: No Porokeratosis. No infection or ulcers    Assessment & Plan:   No diagnosis found.    Patient was evaluated and treated and all questions answered.  Onychomycosis with pain  -Nails palliatively debrided as below. -Educated on self-care  Procedure: Nail Debridement Rationale: pain  Type of Debridement: manual, sharp debridement. Instrumentation: Nail nipper, rotary burr. Number of Nails: 10  Procedures and Treatment: Consent by patient was obtained for treatment procedures. The patient understood the discussion of treatment and procedures well. All questions were answered thoroughly reviewed. Debridement of mycotic and hypertrophic toenails, 1 through 5 bilateral and clearing of subungual debris. No ulceration, no  infection noted.  Return Visit-Office Procedure: Patient instructed to return to the office for a follow up visit 3 months for continued evaluation and treatment.  Boneta Lucks, DPM    No follow-ups on file.

## 2022-03-30 DIAGNOSIS — Z933 Colostomy status: Secondary | ICD-10-CM | POA: Diagnosis not present

## 2022-03-31 MED FILL — Dexamethasone Sodium Phosphate Inj 100 MG/10ML: INTRAMUSCULAR | Qty: 1 | Status: AC

## 2022-04-03 ENCOUNTER — Other Ambulatory Visit: Payer: Self-pay | Admitting: Oncology

## 2022-04-03 ENCOUNTER — Inpatient Hospital Stay: Payer: Medicare Other

## 2022-04-03 ENCOUNTER — Inpatient Hospital Stay (HOSPITAL_BASED_OUTPATIENT_CLINIC_OR_DEPARTMENT_OTHER): Payer: Medicare Other | Admitting: Oncology

## 2022-04-03 ENCOUNTER — Encounter: Payer: Self-pay | Admitting: Oncology

## 2022-04-03 DIAGNOSIS — Z5111 Encounter for antineoplastic chemotherapy: Secondary | ICD-10-CM | POA: Diagnosis not present

## 2022-04-03 DIAGNOSIS — C7801 Secondary malignant neoplasm of right lung: Secondary | ICD-10-CM | POA: Diagnosis not present

## 2022-04-03 DIAGNOSIS — Z86711 Personal history of pulmonary embolism: Secondary | ICD-10-CM | POA: Diagnosis not present

## 2022-04-03 DIAGNOSIS — C20 Malignant neoplasm of rectum: Secondary | ICD-10-CM

## 2022-04-03 DIAGNOSIS — C7802 Secondary malignant neoplasm of left lung: Secondary | ICD-10-CM | POA: Diagnosis not present

## 2022-04-03 DIAGNOSIS — T451X5A Adverse effect of antineoplastic and immunosuppressive drugs, initial encounter: Secondary | ICD-10-CM | POA: Diagnosis not present

## 2022-04-03 DIAGNOSIS — Z5189 Encounter for other specified aftercare: Secondary | ICD-10-CM | POA: Diagnosis not present

## 2022-04-03 DIAGNOSIS — D701 Agranulocytosis secondary to cancer chemotherapy: Secondary | ICD-10-CM

## 2022-04-03 DIAGNOSIS — Z86718 Personal history of other venous thrombosis and embolism: Secondary | ICD-10-CM | POA: Diagnosis not present

## 2022-04-03 DIAGNOSIS — E876 Hypokalemia: Secondary | ICD-10-CM

## 2022-04-03 DIAGNOSIS — Z79899 Other long term (current) drug therapy: Secondary | ICD-10-CM | POA: Diagnosis not present

## 2022-04-03 DIAGNOSIS — K521 Toxic gastroenteritis and colitis: Secondary | ICD-10-CM | POA: Diagnosis not present

## 2022-04-03 DIAGNOSIS — C799 Secondary malignant neoplasm of unspecified site: Secondary | ICD-10-CM

## 2022-04-03 LAB — CBC WITH DIFFERENTIAL/PLATELET
Abs Immature Granulocytes: 0.04 10*3/uL (ref 0.00–0.07)
Basophils Absolute: 0 10*3/uL (ref 0.0–0.1)
Basophils Relative: 0 %
Eosinophils Absolute: 0.2 10*3/uL (ref 0.0–0.5)
Eosinophils Relative: 3 %
HCT: 38.6 % (ref 36.0–46.0)
Hemoglobin: 12.1 g/dL (ref 12.0–15.0)
Immature Granulocytes: 1 %
Lymphocytes Relative: 23 %
Lymphs Abs: 1.4 10*3/uL (ref 0.7–4.0)
MCH: 30.5 pg (ref 26.0–34.0)
MCHC: 31.3 g/dL (ref 30.0–36.0)
MCV: 97.2 fL (ref 80.0–100.0)
Monocytes Absolute: 0.6 10*3/uL (ref 0.1–1.0)
Monocytes Relative: 10 %
Neutro Abs: 3.9 10*3/uL (ref 1.7–7.7)
Neutrophils Relative %: 63 %
Platelets: 204 10*3/uL (ref 150–400)
RBC: 3.97 MIL/uL (ref 3.87–5.11)
RDW: 17.5 % — ABNORMAL HIGH (ref 11.5–15.5)
WBC: 6.1 10*3/uL (ref 4.0–10.5)
nRBC: 0 % (ref 0.0–0.2)

## 2022-04-03 LAB — COMPREHENSIVE METABOLIC PANEL
ALT: 15 U/L (ref 0–44)
AST: 26 U/L (ref 15–41)
Albumin: 4 g/dL (ref 3.5–5.0)
Alkaline Phosphatase: 67 U/L (ref 38–126)
Anion gap: 5 (ref 5–15)
BUN: 10 mg/dL (ref 8–23)
CO2: 28 mmol/L (ref 22–32)
Calcium: 9.3 mg/dL (ref 8.9–10.3)
Chloride: 106 mmol/L (ref 98–111)
Creatinine, Ser: 0.88 mg/dL (ref 0.44–1.00)
GFR, Estimated: >60 mL/min (ref >60)
Glucose, Bld: 92 mg/dL (ref 70–99)
Potassium: 4.4 mmol/L (ref 3.5–5.1)
Sodium: 139 mmol/L (ref 135–145)
Total Bilirubin: 0.5 mg/dL (ref 0.3–1.2)
Total Protein: 6.7 g/dL (ref 6.5–8.1)

## 2022-04-03 LAB — PROTEIN, URINE, RANDOM: Total Protein, Urine: 20 mg/dL

## 2022-04-03 MED ORDER — SODIUM CHLORIDE 0.9 % IV SOLN
750.0000 mg | Freq: Once | INTRAVENOUS | Status: AC
  Administered 2022-04-03: 750 mg via INTRAVENOUS
  Filled 2022-04-03: qty 37.5

## 2022-04-03 MED ORDER — SODIUM CHLORIDE 0.9 % IV SOLN
Freq: Once | INTRAVENOUS | Status: AC
  Filled 2022-04-03: qty 250

## 2022-04-03 MED ORDER — DIPHENHYDRAMINE HCL 50 MG/ML IJ SOLN
25.0000 mg | Freq: Every day | INTRAMUSCULAR | Status: DC | PRN
  Administered 2022-04-03: 25 mg via INTRAVENOUS
  Filled 2022-04-03: qty 1

## 2022-04-03 MED ORDER — SODIUM CHLORIDE 0.9 % IV SOLN
2400.0000 mg/m2 | INTRAVENOUS | Status: DC
  Administered 2022-04-03: 4300 mg via INTRAVENOUS
  Filled 2022-04-03: qty 86

## 2022-04-03 MED ORDER — SODIUM CHLORIDE 0.9 % IV SOLN
350.0000 mg | Freq: Once | INTRAVENOUS | Status: AC
  Administered 2022-04-03: 350 mg via INTRAVENOUS
  Filled 2022-04-03: qty 14

## 2022-04-03 MED ORDER — ATROPINE SULFATE 1 MG/ML IV SOLN
0.4000 mg | Freq: Once | INTRAVENOUS | Status: AC
  Administered 2022-04-03: 0.4 mg via INTRAVENOUS
  Filled 2022-04-03: qty 1

## 2022-04-03 MED ORDER — SODIUM CHLORIDE 0.9 % IV SOLN
100.0000 mg/m2 | Freq: Once | INTRAVENOUS | Status: AC
  Administered 2022-04-03: 180 mg via INTRAVENOUS
  Filled 2022-04-03: qty 5

## 2022-04-03 MED ORDER — PALONOSETRON HCL INJECTION 0.25 MG/5ML
0.2500 mg | Freq: Once | INTRAVENOUS | Status: AC
  Administered 2022-04-03: 0.25 mg via INTRAVENOUS
  Filled 2022-04-03: qty 5

## 2022-04-03 MED ORDER — SODIUM CHLORIDE 0.9 % IV SOLN
10.0000 mg | Freq: Once | INTRAVENOUS | Status: AC
  Administered 2022-04-03: 10 mg via INTRAVENOUS
  Filled 2022-04-03: qty 10

## 2022-04-03 NOTE — Assessment & Plan Note (Signed)
patient will receive long-acting GCSF   Ziextenzo prophylaxis on D3 

## 2022-04-03 NOTE — Assessment & Plan Note (Signed)
Chemotherapy plan as listed above 

## 2022-04-03 NOTE — Progress Notes (Signed)
Hematology/Oncology Progress note [phone] [phone]      Patient Care Team: Olin Hauser, DO as PCP - General (Family Medicine) Clent Jacks, RN as Registered Nurse Earlie Server, MD as Consulting Physician (Hematology and Oncology)  ASSESSMENT & PLAN:   Rectal cancer [facility]) History of stage IIIC Rectal cancer, s/p TNT, followed by 09/17/19 APR/posterior vaginectomy/TAH/BSO/VY-flap, pT4b pN0 with close vaginal margin 0.2 mm.  Uterus and ovaries negative for malignancy. palliative radiation to vaginal recurrence- 01/19/21 recurrence with lung metastasis.-Palliative -FOLFIRI plus bevacizumab.  Irinotecan was dropped in November 2022 due to side effects. Negative for UGT1A1*28  CEA continues to progressively increase, although disease is radiographically stable,  Labs are reviewed and discussed with patient. Proceed with dose reduced FOLFIRI-[Irinotecan 100 mg/m2] / bevacizumab-currently she tolerates well Recommend short-term CT follow-up- July 2023 RAS wild type. Consider switch to irinotecan +panitumumab after progression .  Encounter for antineoplastic chemotherapy Chemotherapy plan as listed above.   Chemotherapy induced neutropenia (HCC)  patient will receive long-acting GCSF  Ziextenzo prophylaxis on D3  Hypokalemia  potassium is stable.  Continue potassium chloride 20 mEq twice daily.   History of pulmonary embolism Continue  Eliquis 2.5 mg twice daily for anticoagulation prophylaxis. No orders of the defined types were placed in this encounter.  Follow up in 2 weeks. Lab MD next cycle of chemo All questions were answered. The patient knows to call the clinic with any problems, questions or concerns.  Earlie Server, MD, PhD [facility] Hematology Oncology 04/03/2022        CHIEF COMPLAINTS/REASON FOR VISIT:  Follow up for rectal cancer  HISTORY OF PRESENTING ILLNESS:  Patient initially presented with complaints of postmenopausal  bleeding on 08/16/2018.  History of was menopausal vaginal bleeding in 2016 which resulted in cervical polypectomy.  Pathology 02/04/2015 showed cervical polyp, consistent with benign endometrial polyp.  [name] after polypectomy due to anxiety associated with pelvic exams.  pelvic exam on 08/16/2018 reviewed cervical abnormality and from enlarged uterus. Seen by Dr. Marcelline Mates on 10/29/2018.  Endometrial biopsy and a Pap smear was performed. 10/29/2018 Pap smear showed adenocarcinoma, favor endometrial origin. 10/29/2018 endometrial biopsy showed endometrioid carcinoma, FIGO grade 1.  10/29/2018- TA & TV Ultrasound revealed: Anteverted uterus measuring 8.7 x 5.6 x 6.4 cm without evidence of focal masses.  The endometrium measuring 24.1 mm (thickened) and heterogeneous.  Right and left ovaries not visualized.  No adnexal masses identified.  No free fluid in cul-de-sac.  Patient was seen by Dr. Theora Gianotti in clinic on 11/13/2018.  Cervical exam reveals 2 cm exophytic irregular mass consistent with malignancy.   11/19/2018 CT chest abdomen pelvis with contrast showed thickened endometrium with some irregularity compatible with the provided diagnosis of endometrial malignancy.  There is a mildly prominent left inguinal node 1.4 cm.  Patient was seen by Dr. Fransisca Connors on 11/20/2018 and left groin lymph node biopsy was recommended.  11/26/2018 patient underwent left inguinal lymph node biopsy. Pathology showed metastatic adenocarcinoma consistent with colorectal origin.  CDX 2+.  Case was discussed on tumor board.  Recommend colonoscopy for further evaluation.  Patient reports significant weight loss 30 pounds over the last year.  Chronic vaginal spotting. Change of bowel habits the past few months.  More constipated.  Family history positive for brother who has colon cancer prostate cancer.  patient has underwent colonoscopy on 12/03/2018 which reviewed a nonobstructing large mass in the rectum.  Also  chronic fistula.  Mass was not circumferential.  This was biopsied  with a cold forceps for histology.  Pathology came back hyperplastic polyp negative for dysplasia and malignancy. [name], patient underwent flex sigmoidoscopy on 12/06/2018 with rebiopsy of the rectal mass. This time biopsy results came back positive for invasive colorectal adenocarcinoma, moderately differentiated. Immunotherapy for nearly mismatch repair protein (MMR ) was performed.  There is no loss of MMR expression.  low probability of MSI high.   # Seen by [name] for evaluation of resectability for rectal cancer. In addition, she also had a second opinion with [facility] where her endometrial biopsy pathology was changed to  adenocarcinoma, consistent with colorectal primary.   [name]. She has home health that has been assisting with ostomy care  Patient was also evaluated by [facility] oncology.  Recommendation is to proceed with TNT with concurrent chemoradiation followed by neoadjuvant chemotherapy followed by surgical resection. Patient prefers to have treatment done locally with [facility].   # Oncology Treatment:  02/03/2019- 03/19/2019  concurrent Xeloda and radiation.  Xeloda dose 810m /m2 BID - rounded to 16554mBID- on days of radiation. 04/09/2019, started on FOLFOX with bolus early.  Omitted.  07/16/2019 finished 8 cycles of FOLFOX. 09/17/19 APR/posterior vaginectomy/TAH/BSO/VY-flap pT4b pN0 with close vaginal margin 0.2 mm.  Uterus and ovaries negative for malignancy. Patient reports bilateral lower extremity numbness and tingling, intermittent, left worse than right. She has lost a lot of weight since her APR surgery.   #Family history with half brother having's history of colon cancer prostate cancer.  Personal history of colorectal cancer.  Patient has not decided if she wants genetic testing.    # history of PE( 01/13/2020)  in the bilateral lower  extremity DVT (01/13/2020).   She finishes 6 months of anticoagulation with Eliquis 5 mg twice daily. Now switched to Eliquis 2.5 mg twice daily..  # She has now developed recurrent disease. #06/30/20  vaginal introitus mass biopsied. Pathology is consistent with metastatic colorectal adenocarcinoma I have discussed with [name]  Dr. MaHester Matesnd the mass is not resectable. Patient has also had colonoscopy by Dr. AnVicente Malesesterday. Normal examination. # 07/16/2020 cycle 1 FOLFIRI  # 07/20/2020 PET scan was done for further evaluation, images are consistent with local recurrence, no distant metastasis. #Discussed with radiation oncology Dr. ChBaruch Goutyill recommends concurrent chemotherapy and radiation. 08/02/2020-08/16/2020, patient starts radiation.  Xeloda was held due to neutropenia 08/17/2020,-09/06/2020 Xeloda 1500 mg twice daily concurrently with radiation  01/31/21 started on FOLFIRI + Bev 05/18/2021 CT chest abdomen pelvis showed Previously noted enlargement of bilateral inguinal lymph nodes is resolved, consistent with treatment response of nodal metastatic disease. Interval decrease in size of multiple small bilateral pulmonary nodules, consistent with treatment response of pulmonary metastatic disease. No evidence of new metastatic disease. 05/24/2021 - 08/30/2021, continued on FOLFIRI plus bevacizumab.  Irinotecan dose was reduced, eventually 10040m2  09/02/2021, CT chest abdomen pelvis without contrast Showed small bilateral pulmonary nodules, unchanged.  Stable metastatic disease.  No noncontrast evidence of new metastatic disease in the chest abdomen pelvis.  Small parastomal hernia.  Enlargement of main pulmonary artery.  Coronary artery disease.  09/13/2021, maintenance 5-FU/bevacizumab 11/28/2021, 5-FU/Irinotecan/bevacizumab.  Irinotecan 100 mg/m2 was added back due to progressively increasing CEA.  INTERVAL HISTORY Jacqueline Yoder a 76 38o. female who has above history reviewed by  me presents for follow-up of rectal cancer. Currently on dose reduced FOLFIRI/bevacizumab  overall she tolerates well.  Diarrhea is manageable and stable symptoms. Improved with  antidiarrhea PRN.  She takes Imodium/Lomotil as needed.  Also on cholestyramine She was accompanied by her daughter today.  No new complaints  Appetite is good. Weight is relatively stable.    Review of Systems  Constitutional:  Positive for fatigue. Negative for appetite change, chills, fever and unexpected weight change.  HENT:   Negative for hearing loss and voice change.   Eyes:  Negative for eye problems.  Respiratory:  Negative for chest tightness and cough.   Cardiovascular:  Negative for chest pain.  Gastrointestinal:  Negative for abdominal distention, abdominal pain, blood in stool, constipation, diarrhea and nausea.  Endocrine: Negative for hot flashes.  Genitourinary:  Negative for difficulty urinating and frequency.   Musculoskeletal:  Negative for arthralgias.  Skin:  Negative for itching and rash.  Neurological:  Negative for extremity weakness and numbness.  Hematological:  Negative for adenopathy.  Psychiatric/Behavioral:  Negative for confusion.     MEDICAL HISTORY:  Past Medical History:  Diagnosis Date   Allergy    Arthritis    Blood clot in vein    Family history of colon cancer    GERD (gastroesophageal reflux disease)    Hypercholesteremia    Hypertension    Hypertension    Lower extremity edema    Personal history of chemotherapy    Rectal cancer ([place]) 12/2018   Urinary incontinence     SURGICAL HISTORY: Past Surgical History:  Procedure Laterality Date   ABDOMINAL HYSTERECTOMY     CHOLECYSTECTOMY  1971   COLONOSCOPY WITH PROPOFOL N/A 12/03/2018   Procedure: COLONOSCOPY WITH PROPOFOL;  Surgeon: Lucilla Lame, MD;  Location: [facility] ENDOSCOPY;  Service: Endoscopy;  Laterality: N/A;   COLONOSCOPY WITH PROPOFOL N/A 07/15/2020   Procedure: COLONOSCOPY WITH PROPOFOL;  Surgeon:  [name], MD;  Location: [facility] ENDOSCOPY;  Service: Gastroenterology;  Laterality: N/A;   FLEXIBLE SIGMOIDOSCOPY N/A 12/06/2018   Procedure: FLEXIBLE SIGMOIDOSCOPY;  Surgeon: [name], MD;  Location: [facility] ENDOSCOPY;  Service: Endoscopy;  Laterality: N/A;   LAPAROSCOPIC COLOSTOMY  01/06/2019   PORTACATH PLACEMENT N/A 04/03/2019   Procedure: INSERTION PORT-A-CATH;  Surgeon: Jules Husbands, MD;  Location: [facility] ORS;  Service: General;  Laterality: N/A;    SOCIAL HISTORY: Social History   Socioeconomic History   Marital status: Widowed    Spouse name: Not on file   Number of children: Not on file   Years of education: Not on file   Highest education level: Not on file  Occupational History   Not on file  Tobacco Use   Smoking status: Former    Types: Cigarettes    Quit date: 12/02/1977    Years since quitting: 44.3   Smokeless tobacco: Former  Scientific laboratory technician Use: Never used  Substance and Sexual Activity   Alcohol use: Never   Drug use: Never   Sexual activity: Not Currently    Birth control/protection: None  Other Topics Concern   Not on file  Social History Narrative   Lives with daughter   Social Determinants of Health   Financial Resource Strain: [place]  (06/28/2021)   Overall Financial Resource Strain (CARDIA)    Difficulty of Paying Living Expenses: Not hard at all  Food Insecurity: No [place] (06/28/2021)   Hunger Vital Sign    Worried About Running Out of Food in the Last Year: Never true    [place] in the Last Year: Never true  Transportation Needs: No Transportation Needs (06/28/2021)   [place] -  Hydrologist (Medical): No    Lack of Transportation (Non-Medical): No  Physical Activity: Inactive (06/28/2021)   Exercise Vital Sign    Days of Exercise per Week: 0 days    Minutes of Exercise per Session: 0 min  Stress: No Stress Concern Present (06/28/2021)   [place]    Feeling of Stress : Not at all  Social Connections: Not on file  Intimate Partner Violence: Not on file    FAMILY HISTORY: Family History  Problem Relation Age of Onset   Colon cancer Brother 42       exposure to chemicals [place]   Hypertension Mother    Stroke Mother    Kidney failure Father    Breast cancer Neg Hx    Ovarian cancer Neg Hx     ALLERGIES:  is allergic to sulfamethoxazole-trimethoprim.  MEDICATIONS:  Current Outpatient Medications  Medication Sig Dispense Refill   Cholecalciferol (VITAMIN D3) 2000 units capsule Take 2,000 Units by mouth daily.     cholestyramine (QUESTRAN) 4 g packet Take 1 packet (4 g total) by mouth 3 (three) times daily. 90 each 1   diclofenac sodium (VOLTAREN) 1 % GEL Apply 2 g topically 4 (four) times daily as needed (joint pain).  11   diphenoxylate-atropine (LOMOTIL) 2.5-0.025 MG tablet Take 1 tablet by mouth 4 (four) times daily as needed for diarrhea or loose stools. 30 tablet 1   ELIQUIS 2.5 MG TABS tablet TAKE 1 TABLET BY MOUTH TWICE  DAILY 60 tablet 11   fluticasone (FLONASE) 50 MCG/ACT nasal spray USE 1 SPRAY IN EACH NOSTRIL ONCE DAILY 16 g 3   gabapentin (NEURONTIN) 100 MG capsule Take 1 capsule (100 mg total) by mouth at bedtime. 30 capsule 0   ipratropium (ATROVENT) 0.03 % nasal spray Place 1 spray into both nostrils 2 (two) times daily. 30 mL 2   lidocaine-prilocaine (EMLA) cream Apply 1 application. topically as needed. 30 g 6   loperamide (IMODIUM) 2 MG capsule Take 1 capsule (2 mg total) by mouth See admin instructions. With onset of loose stool, take 48m followed by 235mevery 2 hours,  Maximum: 16 mg/day 120 capsule 1   loratadine (CLARITIN) 10 MG tablet Take 10 mg by mouth daily.     Multiple Vitamins-Minerals (ONE-A-DAY WOMENS 50 PLUS PO) Take 1 tablet by mouth daily.      ondansetron (ZOFRAN) 8 MG tablet Take 1 tablet (8 mg total) by mouth 2 (two) times daily as needed for refractory nausea /  vomiting. Start on day 3 after chemotherapy. 30 tablet 1   potassium chloride SA (KLOR-CON M) 20 MEQ tablet Take 2 tablets (40 mEq total) by mouth 2 (two) times daily. 120 tablet 1   prochlorperazine (COMPAZINE) 10 MG tablet Take 1 tablet (10 mg total) by mouth every 6 (six) hours as needed (NAUSEA). 30 tablet 1   simvastatin (ZOCOR) 40 MG tablet TAKE 1 TABLET BY MOUTH AT  BEDTIME 90 tablet 1   triamterene-hydrochlorothiazide (DYAZIDE) 37.5-25 MG capsule TAKE 1 CAPSULE BY MOUTH  DAILY 90 capsule 1   zinc gluconate 50 MG tablet Take 50 mg by mouth daily.     No current facility-administered medications for this visit.   Facility-Administered Medications Ordered in Other Visits  Medication Dose Route Frequency Provider Last Rate Last Admin   sodium chloride flush (NS) 0.9 % injection 10 mL  10 mL Intracatheter PRN YuEarlie ServerMD  10 mL at 03/22/22 1258     PHYSICAL EXAMINATION: ECOG PERFORMANCE STATUS: 1 - Symptomatic but completely ambulatory  Physical Exam Constitutional:      General: She is not in acute distress. HENT:     Head: Normocephalic and atraumatic.  Eyes:     General: No scleral icterus. Cardiovascular:     Rate and Rhythm: Normal rate and regular rhythm.     Heart sounds: Normal heart sounds.  Pulmonary:     Effort: Pulmonary effort is normal. No respiratory distress.     Breath sounds: No wheezing.  Abdominal:     General: Bowel sounds are normal. There is no distension.     Palpations: Abdomen is soft.     Comments: + Colostomy bag   Musculoskeletal:        General: No deformity. Normal range of motion.     Cervical back: Normal range of motion and neck supple.  Skin:    General: Skin is warm and dry.     Findings: No erythema or rash.  Neurological:     Mental Status: She is alert and oriented to person, place, and time. Mental status is at baseline.     Cranial Nerves: No cranial nerve deficit.     Coordination: Coordination normal.       LABORATORY  DATA:  I have reviewed the data as listed Lab Results  Component Value Date   WBC 6.1 04/03/2022   HGB 12.1 04/03/2022   HCT 38.6 04/03/2022   MCV 97.2 04/03/2022   PLT 204 04/03/2022   Recent Labs    03/06/22 0822 03/20/22 0814 04/03/22 0814  NA 138 139 139  K 4.3 4.1 4.4  CL 104 105 106  CO2 _0 GLUCOSE 97 92 92  BUN _1 CREATININE 0.90 0.89 0.88  CALCIUM 9.1 9.4 9.3  GFRNONAA >60 >60 >60  PROT 6.9 7.0 6.7  ALBUMIN 3.9 3.9 4.0  AST _2 ALT _3 ALKPHOS 71 72 67  BILITOT 0.6 0.2* 0.5    Iron/TIBC/Ferritin/ %Sat    Component Value Date/Time   IRON 40 06/23/2020 1124   TIBC 246 (L) 06/23/2020 1124   FERRITIN 338 (H) 06/23/2020 1124   IRONPCTSAT 16 06/23/2020 1124     RADIOGRAPHIC STUDIES: I have personally reviewed the radiological images as listed and agreed with the findings in the report. CT CHEST ABDOMEN PELVIS W CONTRAST  Result Date: 01/20/2022 CLINICAL DATA:  Restaging rectal cancer.  Ongoing chemotherapy. * Tracking Code: BO * EXAM: CT CHEST, ABDOMEN, AND PELVIS WITH CONTRAST TECHNIQUE: Multidetector CT imaging of the chest, abdomen and pelvis was performed following the standard protocol during bolus administration of intravenous contrast. RADIATION DOSE REDUCTION: This exam was performed according to the departmental dose-optimization program which includes automated exposure control, adjustment of the mA and/or kV according to patient size and/or use of iterative reconstruction technique. CONTRAST:  65m OMNIPAQUE IOHEXOL 300 MG/ML  SOLN COMPARISON:  Multiple exams, including 11/01/2021 FINDINGS: CT CHEST FINDINGS Cardiovascular: Right Port-A-Cath tip: Cavoatrial junction. Mildly prominent main pulmonary artery. Mediastinum/Nodes: Small left supraclavicular lymph nodes measuring up to 0.5 cm in short axis on image 8 series 2, stable. No current pathologic adenopathy in the chest. Lungs/Pleura: Scattered stable bilateral pulmonary nodules.  An index 4 by 3 mm left upper lobe nodule previously measured 4 by 3 mm when measured in the same fashion. An index 6 by 4 by 6 mm right middle  lobe pulmonary nodule is stable. No new pulmonary nodules are identified. Stable mild scarring posteriorly in the right lower lobe and inferiorly in the right middle lobe. Musculoskeletal: Unremarkable CT ABDOMEN PELVIS FINDINGS Hepatobiliary: Gallbladder absent. Mild but improved biliary dilatation, common bile duct currently 0.7 cm on image 61 series 2, formerly 0.9 cm at this location. No focal hepatic abnormality observed. Pancreas: Borderline dorsal pancreatic duct dilatation, improved from prior. Spleen: Unremarkable Adrenals/Urinary Tract: No change in bilateral renal cysts. Some of the hypodense renal lesions are too small to characterize. In general, such lesions are not felt to warrant further workup unless there is a specific renal clinical concern. Adrenal glands unremarkable. Stomach/Bowel: Prior APR with left colostomy. The wall thickening in segments of the large bowel is probably attributable to nondistention. Small amount of fluid signal along the fatty tissues at the ostomy site, as before. Pelvic floor laxity with bulging small bowel loops posteriorly along the pelvic floor. Vascular/Lymphatic: Atherosclerosis is present, including aortoiliac atherosclerotic disease. No pathologic adenopathy. Reproductive: Prior hysterectomy.  Adnexa unremarkable. Other: No supplemental non-categorized findings. Musculoskeletal: Lumbar spondylosis and degenerative disc disease. IMPRESSION: 1. Stable scattered small pulmonary nodules. 2. The biliary dilatation shown on the prior exam is mildly improved. 3. Prior APR with left colostomy. As before there is a small amount of fluid density in the peristomal adipose tissues. 4.  Aortic Atherosclerosis (ICD10-I70.0). 5. Pelvic floor laxity with bulging of small bowel loops along the pelvic floor. 6. Mildly prominent main  pulmonary artery may indicate pulmonary arterial hypertension. Electronically Signed   By: Van Clines M.D.   On: 01/20/2022 10:32   MM 3D SCREEN BREAST BILATERAL  Result Date: 01/16/2022 CLINICAL DATA:  Screening. EXAM: DIGITAL SCREENING BILATERAL MAMMOGRAM WITH TOMOSYNTHESIS AND CAD TECHNIQUE: Bilateral screening digital craniocaudal and mediolateral oblique mammograms were obtained. Bilateral screening digital breast tomosynthesis was performed. The images were evaluated with computer-aided detection. COMPARISON:  Previous exam(s). ACR Breast Density Category b: There are scattered areas of fibroglandular density. FINDINGS: There are no findings suspicious for malignancy. IMPRESSION: No mammographic evidence of malignancy. A result letter of this screening mammogram will be mailed directly to the patient. RECOMMENDATION: Screening mammogram in one year. (Code:SM-B-01Y) BI-RADS CATEGORY  1: Negative. Electronically Signed   By: Lovey Newcomer M.D.   On: 01/16/2022 14:16      CT CHEST ABDOMEN PELVIS W CONTRAST  Result Date: 01/20/2022 CLINICAL DATA:  Restaging rectal cancer.  Ongoing chemotherapy. * Tracking Code: BO * EXAM: CT CHEST, ABDOMEN, AND PELVIS WITH CONTRAST TECHNIQUE: Multidetector CT imaging of the chest, abdomen and pelvis was performed following the standard protocol during bolus administration of intravenous contrast. RADIATION DOSE REDUCTION: This exam was performed according to the departmental dose-optimization program which includes automated exposure control, adjustment of the mA and/or kV according to patient size and/or use of iterative reconstruction technique. CONTRAST:  13m OMNIPAQUE IOHEXOL 300 MG/ML  SOLN COMPARISON:  Multiple exams, including 11/01/2021 FINDINGS: CT CHEST FINDINGS Cardiovascular: Right Port-A-Cath tip: Cavoatrial junction. Mildly prominent main pulmonary artery. Mediastinum/Nodes: Small left supraclavicular lymph nodes measuring up to 0.5 cm in short axis on  image 8 series 2, stable. No current pathologic adenopathy in the chest. Lungs/Pleura: Scattered stable bilateral pulmonary nodules. An index 4 by 3 mm left upper lobe nodule previously measured 4 by 3 mm when measured in the same fashion. An index 6 by 4 by 6 mm right middle lobe pulmonary nodule is stable. No new pulmonary nodules are identified. Stable mild scarring posteriorly  in the right lower lobe and inferiorly in the right middle lobe. Musculoskeletal: Unremarkable CT ABDOMEN PELVIS FINDINGS Hepatobiliary: Gallbladder absent. Mild but improved biliary dilatation, common bile duct currently 0.7 cm on image 61 series 2, formerly 0.9 cm at this location. No focal hepatic abnormality observed. Pancreas: Borderline dorsal pancreatic duct dilatation, improved from prior. Spleen: Unremarkable Adrenals/Urinary Tract: No change in bilateral renal cysts. Some of the hypodense renal lesions are too small to characterize. In general, such lesions are not felt to warrant further workup unless there is a specific renal clinical concern. Adrenal glands unremarkable. Stomach/Bowel: Prior APR with left colostomy. The wall thickening in segments of the large bowel is probably attributable to nondistention. Small amount of fluid signal along the fatty tissues at the ostomy site, as before. Pelvic floor laxity with bulging small bowel loops posteriorly along the pelvic floor. Vascular/Lymphatic: Atherosclerosis is present, including aortoiliac atherosclerotic disease. No pathologic adenopathy. Reproductive: Prior hysterectomy.  Adnexa unremarkable. Other: No supplemental non-categorized findings. Musculoskeletal: Lumbar spondylosis and degenerative disc disease. IMPRESSION: 1. Stable scattered small pulmonary nodules. 2. The biliary dilatation shown on the prior exam is mildly improved. 3. Prior APR with left colostomy. As before there is a small amount of fluid density in the peristomal adipose tissues. 4.  Aortic  Atherosclerosis (ICD10-I70.0). 5. Pelvic floor laxity with bulging of small bowel loops along the pelvic floor. 6. Mildly prominent main pulmonary artery may indicate pulmonary arterial hypertension. Electronically Signed   By: Van Clines M.D.   On: 01/20/2022 10:32   MM 3D SCREEN BREAST BILATERAL  Result Date: 01/16/2022 CLINICAL DATA:  Screening. EXAM: DIGITAL SCREENING BILATERAL MAMMOGRAM WITH TOMOSYNTHESIS AND CAD TECHNIQUE: Bilateral screening digital craniocaudal and mediolateral oblique mammograms were obtained. Bilateral screening digital breast tomosynthesis was performed. The images were evaluated with computer-aided detection. COMPARISON:  Previous exam(s). ACR Breast Density Category b: There are scattered areas of fibroglandular density. FINDINGS: There are no findings suspicious for malignancy. IMPRESSION: No mammographic evidence of malignancy. A result letter of this screening mammogram will be mailed directly to the patient. RECOMMENDATION: Screening mammogram in one year. (Code:[id number]) BI-RADS CATEGORY  1: Negative. Electronically Signed   By: Lovey Newcomer M.D.   On: 01/16/2022 14:16

## 2022-04-03 NOTE — Assessment & Plan Note (Addendum)
History of stage IIIC Rectal cancer, s/p TNT, followed by 09/17/19 APR/posterior vaginectomy/TAH/BSO/VY-flap, pT4b pN0 with close vaginal margin 0.2 mm.  Uterus and ovaries negative for malignancy. palliative radiation to vaginal recurrence- 01/19/21 recurrence with lung metastasis.-Palliative -FOLFIRI plus bevacizumab.  Irinotecan was dropped in November 2022 due to side effects. Negative for UGT1A1*28  CEA continues to progressively increase, although disease is radiographically stable,  Labs are reviewed and discussed with patient. Proceed with dose reduced FOLFIRI-[Irinotecan 100 mg/m2] / bevacizumab-currently she tolerates well Recommend short-term CT follow-up- July 2023 RAS wild type. Consider switch to irinotecan +panitumumab after progression .

## 2022-04-03 NOTE — Assessment & Plan Note (Signed)
Continue  Eliquis 2.5 mg twice daily for anticoagulation prophylaxis. 

## 2022-04-03 NOTE — Telephone Encounter (Signed)
Component Ref Range & Units 08:14 (04/03/22) 2 wk ago (03/20/22) 4 wk ago (03/06/22) 1 mo ago (02/20/22) 1 mo ago (02/06/22) 2 mo ago (01/23/22) 2 mo ago (01/09/22)  Potassium 3.5 - 5.1 mmol/L 4.4  4.1  4.3  4.1  4.3  4.0  4.0

## 2022-04-03 NOTE — Assessment & Plan Note (Signed)
potassium is stable.  Continue potassium chloride 20 mEq twice daily.  

## 2022-04-04 ENCOUNTER — Encounter: Payer: Self-pay | Admitting: Oncology

## 2022-04-04 LAB — CEA: CEA: 1258 ng/mL — ABNORMAL HIGH (ref 0.0–4.7)

## 2022-04-05 ENCOUNTER — Inpatient Hospital Stay: Payer: Medicare Other

## 2022-04-05 DIAGNOSIS — Z86718 Personal history of other venous thrombosis and embolism: Secondary | ICD-10-CM | POA: Diagnosis not present

## 2022-04-05 DIAGNOSIS — Z79899 Other long term (current) drug therapy: Secondary | ICD-10-CM | POA: Diagnosis not present

## 2022-04-05 DIAGNOSIS — Z5189 Encounter for other specified aftercare: Secondary | ICD-10-CM | POA: Diagnosis not present

## 2022-04-05 DIAGNOSIS — C20 Malignant neoplasm of rectum: Secondary | ICD-10-CM | POA: Diagnosis not present

## 2022-04-05 DIAGNOSIS — E876 Hypokalemia: Secondary | ICD-10-CM | POA: Diagnosis not present

## 2022-04-05 DIAGNOSIS — D701 Agranulocytosis secondary to cancer chemotherapy: Secondary | ICD-10-CM | POA: Diagnosis not present

## 2022-04-05 DIAGNOSIS — K521 Toxic gastroenteritis and colitis: Secondary | ICD-10-CM | POA: Diagnosis not present

## 2022-04-05 DIAGNOSIS — Z5111 Encounter for antineoplastic chemotherapy: Secondary | ICD-10-CM | POA: Diagnosis not present

## 2022-04-05 DIAGNOSIS — C7802 Secondary malignant neoplasm of left lung: Secondary | ICD-10-CM | POA: Diagnosis not present

## 2022-04-05 DIAGNOSIS — C799 Secondary malignant neoplasm of unspecified site: Secondary | ICD-10-CM

## 2022-04-05 DIAGNOSIS — Z86711 Personal history of pulmonary embolism: Secondary | ICD-10-CM | POA: Diagnosis not present

## 2022-04-05 DIAGNOSIS — C7801 Secondary malignant neoplasm of right lung: Secondary | ICD-10-CM | POA: Diagnosis not present

## 2022-04-05 MED ORDER — PEGFILGRASTIM-BMEZ 6 MG/0.6ML ~~LOC~~ SOSY
6.0000 mg | PREFILLED_SYRINGE | Freq: Once | SUBCUTANEOUS | Status: AC
  Administered 2022-04-05: 6 mg via SUBCUTANEOUS
  Filled 2022-04-05: qty 0.6

## 2022-04-05 MED ORDER — HEPARIN SOD (PORK) LOCK FLUSH 100 UNIT/ML IV SOLN
500.0000 [IU] | Freq: Once | INTRAVENOUS | Status: AC | PRN
  Administered 2022-04-05: 500 [IU]
  Filled 2022-04-05: qty 5

## 2022-04-05 MED ORDER — HEPARIN SOD (PORK) LOCK FLUSH 100 UNIT/ML IV SOLN
INTRAVENOUS | Status: AC
  Filled 2022-04-05: qty 5

## 2022-04-05 MED ORDER — SODIUM CHLORIDE 0.9% FLUSH
10.0000 mL | INTRAVENOUS | Status: DC | PRN
  Administered 2022-04-05: 10 mL
  Filled 2022-04-05: qty 10

## 2022-04-05 NOTE — Patient Instructions (Signed)
[facility] AT [place]  Discharge Instructions: Thank you for choosing [facility] to provide your oncology and hematology care.  If you have a lab appointment with the [facility], please go directly to the [facility] and check in at the registration area.  Wear comfortable clothing and clothing appropriate for easy access to any Portacath or PICC line.   We strive to give you quality time with your provider. You may need to reschedule your appointment if you arrive late (15 or more minutes).  Arriving late affects you and other patients whose appointments are after yours.  Also, if you miss three or more appointments without notifying the office, you may be dismissed from the clinic at the provider's discretion.      For prescription refill requests, have your pharmacy contact our office and allow 72 hours for refills to be completed.    Today you received the following chemotherapy and/or immunotherapy agents PUMP STOP AND ZIEXTENZO      To help prevent nausea and vomiting after your treatment, we encourage you to take your nausea medication as directed.  BELOW ARE SYMPTOMS THAT SHOULD BE REPORTED IMMEDIATELY: *FEVER GREATER THAN 100.4 F (38 C) OR HIGHER *CHILLS OR SWEATING *NAUSEA AND VOMITING THAT IS NOT CONTROLLED WITH YOUR NAUSEA MEDICATION *UNUSUAL SHORTNESS OF BREATH *UNUSUAL BRUISING OR BLEEDING *URINARY PROBLEMS (pain or burning when urinating, or frequent urination) *BOWEL PROBLEMS (unusual diarrhea, constipation, pain near the anus) TENDERNESS IN MOUTH AND THROAT WITH OR WITHOUT PRESENCE OF ULCERS (sore throat, sores in mouth, or a toothache) UNUSUAL RASH, SWELLING OR PAIN  UNUSUAL VAGINAL DISCHARGE OR ITCHING   Items with * indicate a potential emergency and should be followed up as soon as possible or go to the Emergency Department if any problems should occur.  Please show the CHEMOTHERAPY ALERT CARD or IMMUNOTHERAPY ALERT CARD at  check-in to the Emergency Department and triage nurse.  Should you have questions after your visit or need to cancel or reschedule your appointment, please contact [facility] CANCER [place] AT [place]  [phone] and follow the prompts.  Office hours are 8:00 a.m. to 4:30 p.m. Monday - Friday. Please note that voicemails left after 4:00 p.m. may not be returned until the following business day.  We are closed weekends and major holidays. You have access to a nurse at all times for urgent questions. Please call the main number to the clinic [phone] and follow the prompts.  For any non-urgent questions, you may also contact your provider using MyChart. We now offer e-Visits for anyone 59 and older to request care online for non-urgent symptoms. For details visit [email or web address].   Also download the MyChart app! Go to the app store, search "MyChart", open the app, select [facility], and log in with your MyChart username and password.  Masks are optional in the cancer centers. If you would like for your care team to wear a mask while they are taking care of you, please let them know. For doctor visits, patients may have with them one support person who is at least 75 years old. At this time, visitors are not allowed in the infusion area.  Pegfilgrastim Injection What is this medication? PEGFILGRASTIM (PEG fil gra stim) lowers the risk of infection in people who are receiving chemotherapy. It works by Building control surveyor make more white blood cells, which protects your body from infection. It may also be used to help people who have been exposed to high  doses of radiation. This medicine may be used for other purposes; ask your health care provider or pharmacist if you have questions. COMMON BRAND NAME(S): [employer], Neulasta, Nyvepria, Stimufend, UDENYCA, Ziextenzo What should I tell my care team before I take this medication? They need to know if you have any of these  conditions: Kidney disease Latex allergy Ongoing radiation therapy Sickle cell disease Skin reactions to acrylic adhesives (On-Body Injector only) An unusual or allergic reaction to pegfilgrastim, filgrastim, other medications, foods, dyes, or preservatives Pregnant or trying to get pregnant Breast-feeding How should I use this medication? This medication is for injection under the skin. If you get this medication at home, you will be taught how to prepare and give the pre-filled syringe or how to use the On-body Injector. Refer to the patient Instructions for Use for detailed instructions. Use exactly as directed. Tell your care team immediately if you suspect that the On-body Injector may not have performed as intended or if you suspect the use of the On-body Injector resulted in a missed or partial dose. It is important that you put your used needles and syringes in a special sharps container. Do not put them in a trash can. If you do not have a sharps container, call your pharmacist or care team to get one. Talk to your care team about the use of this medication in children. While this medication may be prescribed for selected conditions, precautions do apply. Overdosage: If you think you have taken too much of this medicine contact a poison control center or emergency room at once. NOTE: This medicine is only for you. Do not share this medicine with others. What if I miss a dose? It is important not to miss your dose. Call your care team if you miss your dose. If you miss a dose due to an On-body Injector failure or leakage, a new dose should be administered as soon as possible using a single prefilled syringe for manual use. What may interact with this medication? Interactions have not been studied. This list may not describe all possible interactions. Give your health care provider a list of all the medicines, herbs, non-prescription drugs, or dietary supplements you use. Also tell them if  you smoke, drink alcohol, or use illegal drugs. Some items may interact with your medicine. What should I watch for while using this medication? Your condition will be monitored carefully while you are receiving this medication. You may need blood work done while you are taking this medication. Talk to your care team about your risk of cancer. You may be more at risk for certain types of cancer if you take this medication. If you are going to need a MRI, CT scan, or other procedure, tell your care team that you are using this medication (On-Body Injector only). What side effects may I notice from receiving this medication? Side effects that you should report to your care team as soon as possible: Allergic reactions--skin rash, itching, hives, swelling of the face, lips, tongue, or throat Capillary leak syndrome--stomach or muscle pain, unusual weakness or fatigue, feeling faint or lightheaded, decrease in the amount of urine, swelling of the ankles, hands, or feet, trouble breathing High white blood cell level--fever, fatigue, trouble breathing, night sweats, change in vision, weight loss Inflammation of the aorta--fever, fatigue, back, chest, or stomach pain, severe headache Kidney injury (glomerulonephritis)--decrease in the amount of urine, red or dark brown urine, foamy or bubbly urine, swelling of the ankles, hands, or feet  Shortness of breath or trouble breathing Spleen injury--pain in upper left stomach or shoulder Unusual bruising or bleeding Side effects that usually do not require medical attention (report to your care team if they continue or are bothersome): Bone pain Pain in the hands or feet This list may not describe all possible side effects. Call your doctor for medical advice about side effects. You may report side effects to FDA at 1-800-FDA-1088. Where should I keep my medication? Keep out of the reach of children. If you are using this medication at home, you will be  instructed on how to store it. Throw away any unused medication after the expiration date on the label. NOTE: This sheet is a summary. It may not cover all possible information. If you have questions about this medicine, talk to your doctor, pharmacist, or health care provider.  2023 Elsevier/Gold Standard (2021-09-02 00:00:00)

## 2022-04-11 ENCOUNTER — Telehealth: Payer: Self-pay

## 2022-04-11 ENCOUNTER — Encounter: Payer: Self-pay | Admitting: Oncology

## 2022-04-11 NOTE — Telephone Encounter (Signed)
New dermatology referral faxed to [facility] per pt request     Ph: [phone] Fax: [phone]

## 2022-04-17 ENCOUNTER — Encounter: Payer: Self-pay | Admitting: Oncology

## 2022-04-17 ENCOUNTER — Inpatient Hospital Stay: Payer: Medicare Other | Attending: Oncology

## 2022-04-17 ENCOUNTER — Inpatient Hospital Stay: Payer: Medicare Other

## 2022-04-17 ENCOUNTER — Inpatient Hospital Stay (HOSPITAL_BASED_OUTPATIENT_CLINIC_OR_DEPARTMENT_OTHER): Payer: Medicare Other | Admitting: Oncology

## 2022-04-17 DIAGNOSIS — C20 Malignant neoplasm of rectum: Secondary | ICD-10-CM | POA: Insufficient documentation

## 2022-04-17 DIAGNOSIS — I2510 Atherosclerotic heart disease of native coronary artery without angina pectoris: Secondary | ICD-10-CM | POA: Insufficient documentation

## 2022-04-17 DIAGNOSIS — D702 Other drug-induced agranulocytosis: Secondary | ICD-10-CM | POA: Insufficient documentation

## 2022-04-17 DIAGNOSIS — K435 Parastomal hernia without obstruction or  gangrene: Secondary | ICD-10-CM | POA: Diagnosis not present

## 2022-04-17 DIAGNOSIS — Z79899 Other long term (current) drug therapy: Secondary | ICD-10-CM | POA: Diagnosis not present

## 2022-04-17 DIAGNOSIS — Z86718 Personal history of other venous thrombosis and embolism: Secondary | ICD-10-CM | POA: Insufficient documentation

## 2022-04-17 DIAGNOSIS — T451X5A Adverse effect of antineoplastic and immunosuppressive drugs, initial encounter: Secondary | ICD-10-CM | POA: Insufficient documentation

## 2022-04-17 DIAGNOSIS — E7800 Pure hypercholesterolemia, unspecified: Secondary | ICD-10-CM | POA: Diagnosis not present

## 2022-04-17 DIAGNOSIS — Z86711 Personal history of pulmonary embolism: Secondary | ICD-10-CM

## 2022-04-17 DIAGNOSIS — D6481 Anemia due to antineoplastic chemotherapy: Secondary | ICD-10-CM

## 2022-04-17 DIAGNOSIS — Z9221 Personal history of antineoplastic chemotherapy: Secondary | ICD-10-CM | POA: Insufficient documentation

## 2022-04-17 DIAGNOSIS — D701 Agranulocytosis secondary to cancer chemotherapy: Secondary | ICD-10-CM | POA: Diagnosis not present

## 2022-04-17 DIAGNOSIS — C799 Secondary malignant neoplasm of unspecified site: Secondary | ICD-10-CM | POA: Diagnosis not present

## 2022-04-17 DIAGNOSIS — Z5111 Encounter for antineoplastic chemotherapy: Secondary | ICD-10-CM

## 2022-04-17 DIAGNOSIS — R197 Diarrhea, unspecified: Secondary | ICD-10-CM | POA: Diagnosis not present

## 2022-04-17 DIAGNOSIS — Z933 Colostomy status: Secondary | ICD-10-CM | POA: Diagnosis not present

## 2022-04-17 DIAGNOSIS — R634 Abnormal weight loss: Secondary | ICD-10-CM | POA: Insufficient documentation

## 2022-04-17 DIAGNOSIS — E876 Hypokalemia: Secondary | ICD-10-CM | POA: Diagnosis not present

## 2022-04-17 DIAGNOSIS — K219 Gastro-esophageal reflux disease without esophagitis: Secondary | ICD-10-CM | POA: Insufficient documentation

## 2022-04-17 DIAGNOSIS — Z95828 Presence of other vascular implants and grafts: Secondary | ICD-10-CM

## 2022-04-17 DIAGNOSIS — I10 Essential (primary) hypertension: Secondary | ICD-10-CM | POA: Diagnosis not present

## 2022-04-17 DIAGNOSIS — Z7901 Long term (current) use of anticoagulants: Secondary | ICD-10-CM | POA: Insufficient documentation

## 2022-04-17 LAB — COMPREHENSIVE METABOLIC PANEL
ALT: 19 U/L (ref 0–44)
AST: 29 U/L (ref 15–41)
Albumin: 4.1 g/dL (ref 3.5–5.0)
Alkaline Phosphatase: 75 U/L (ref 38–126)
Anion gap: 8 (ref 5–15)
BUN: 11 mg/dL (ref 8–23)
CO2: 26 mmol/L (ref 22–32)
Calcium: 9.2 mg/dL (ref 8.9–10.3)
Chloride: 105 mmol/L (ref 98–111)
Creatinine, Ser: 0.92 mg/dL (ref 0.44–1.00)
GFR, Estimated: >60 mL/min (ref >60)
Glucose, Bld: 91 mg/dL (ref 70–99)
Potassium: 4.2 mmol/L (ref 3.5–5.1)
Sodium: 139 mmol/L (ref 135–145)
Total Bilirubin: 0.4 mg/dL (ref 0.3–1.2)
Total Protein: 6.6 g/dL (ref 6.5–8.1)

## 2022-04-17 LAB — CBC WITH DIFFERENTIAL/PLATELET
Abs Immature Granulocytes: 0.05 10*3/uL (ref 0.00–0.07)
Basophils Absolute: 0 10*3/uL (ref 0.0–0.1)
Basophils Relative: 1 %
Eosinophils Absolute: 0.1 10*3/uL (ref 0.0–0.5)
Eosinophils Relative: 2 %
HCT: 37.5 % (ref 36.0–46.0)
Hemoglobin: 12 g/dL (ref 12.0–15.0)
Immature Granulocytes: 1 %
Lymphocytes Relative: 21 %
Lymphs Abs: 1.3 10*3/uL (ref 0.7–4.0)
MCH: 31 pg (ref 26.0–34.0)
MCHC: 32 g/dL (ref 30.0–36.0)
MCV: 96.9 fL (ref 80.0–100.0)
Monocytes Absolute: 0.6 10*3/uL (ref 0.1–1.0)
Monocytes Relative: 10 %
Neutro Abs: 4 10*3/uL (ref 1.7–7.7)
Neutrophils Relative %: 65 %
Platelets: 195 10*3/uL (ref 150–400)
RBC: 3.87 MIL/uL (ref 3.87–5.11)
RDW: 17.6 % — ABNORMAL HIGH (ref 11.5–15.5)
WBC: 6.1 10*3/uL (ref 4.0–10.5)
nRBC: 0 % (ref 0.0–0.2)

## 2022-04-17 LAB — PROTEIN, URINE, RANDOM: Total Protein, Urine: 20 mg/dL

## 2022-04-17 MED ORDER — ATROPINE SULFATE 1 MG/ML IV SOLN
0.4000 mg | Freq: Once | INTRAVENOUS | Status: AC
  Administered 2022-04-17: 0.4 mg via INTRAVENOUS
  Filled 2022-04-17: qty 1

## 2022-04-17 MED ORDER — SODIUM CHLORIDE 0.9 % IV SOLN
2400.0000 mg/m2 | INTRAVENOUS | Status: DC
  Administered 2022-04-17: 4300 mg via INTRAVENOUS
  Filled 2022-04-17: qty 86

## 2022-04-17 MED ORDER — DIPHENHYDRAMINE HCL 50 MG/ML IJ SOLN
25.0000 mg | Freq: Every day | INTRAMUSCULAR | Status: DC | PRN
  Administered 2022-04-17: 25 mg via INTRAVENOUS
  Filled 2022-04-17: qty 1

## 2022-04-17 MED ORDER — SODIUM CHLORIDE 0.9% FLUSH
10.0000 mL | Freq: Once | INTRAVENOUS | Status: AC
  Administered 2022-04-17: 10 mL via INTRAVENOUS
  Filled 2022-04-17: qty 10

## 2022-04-17 MED ORDER — SODIUM CHLORIDE 0.9 % IV SOLN
Freq: Once | INTRAVENOUS | Status: AC
  Filled 2022-04-17: qty 250

## 2022-04-17 MED ORDER — SODIUM CHLORIDE 0.9 % IV SOLN
750.0000 mg | Freq: Once | INTRAVENOUS | Status: AC
  Administered 2022-04-17: 750 mg via INTRAVENOUS
  Filled 2022-04-17: qty 37.5

## 2022-04-17 MED ORDER — SODIUM CHLORIDE 0.9 % IV SOLN
350.0000 mg | Freq: Once | INTRAVENOUS | Status: AC
  Administered 2022-04-17: 350 mg via INTRAVENOUS
  Filled 2022-04-17: qty 14

## 2022-04-17 MED ORDER — SODIUM CHLORIDE 0.9 % IV SOLN
10.0000 mg | Freq: Once | INTRAVENOUS | Status: AC
  Administered 2022-04-17: 10 mg via INTRAVENOUS
  Filled 2022-04-17: qty 10

## 2022-04-17 MED ORDER — SODIUM CHLORIDE 0.9 % IV SOLN
100.0000 mg/m2 | Freq: Once | INTRAVENOUS | Status: AC
  Administered 2022-04-17: 180 mg via INTRAVENOUS
  Filled 2022-04-17: qty 4

## 2022-04-17 MED ORDER — PALONOSETRON HCL INJECTION 0.25 MG/5ML
0.2500 mg | Freq: Once | INTRAVENOUS | Status: AC
  Administered 2022-04-17: 0.25 mg via INTRAVENOUS
  Filled 2022-04-17: qty 5

## 2022-04-17 NOTE — Assessment & Plan Note (Signed)
Continue  Eliquis 2.5 mg twice daily for anticoagulation prophylaxis. 

## 2022-04-17 NOTE — Patient Instructions (Signed)
[facility] CANCER CTR AT [place]  Discharge Instructions: Thank you for choosing [facility] to provide your oncology and hematology care.  If you have a lab appointment with the [facility], please go directly to the [facility] and check in at the registration area.  Wear comfortable clothing and clothing appropriate for easy access to any Portacath or PICC line.   We strive to give you quality time with your provider. You may need to reschedule your appointment if you arrive late (15 or more minutes).  Arriving late affects you and other patients whose appointments are after yours.  Also, if you miss three or more appointments without notifying the office, you may be dismissed from the clinic at the provider's discretion.      For prescription refill requests, have your pharmacy contact our office and allow 72 hours for refills to be completed.    Today you received the following chemotherapy and/or immunotherapy agents FOLFIRI, Mvasi      To help prevent nausea and vomiting after your treatment, we encourage you to take your nausea medication as directed.  BELOW ARE SYMPTOMS THAT SHOULD BE REPORTED IMMEDIATELY: *FEVER GREATER THAN 100.4 F (38 C) OR HIGHER *CHILLS OR SWEATING *NAUSEA AND VOMITING THAT IS NOT CONTROLLED WITH YOUR NAUSEA MEDICATION *UNUSUAL SHORTNESS OF BREATH *UNUSUAL BRUISING OR BLEEDING *URINARY PROBLEMS (pain or burning when urinating, or frequent urination) *BOWEL PROBLEMS (unusual diarrhea, constipation, pain near the anus) TENDERNESS IN MOUTH AND THROAT WITH OR WITHOUT PRESENCE OF ULCERS (sore throat, sores in mouth, or a toothache) UNUSUAL RASH, SWELLING OR PAIN  UNUSUAL VAGINAL DISCHARGE OR ITCHING   Items with * indicate a potential emergency and should be followed up as soon as possible or go to the Emergency Department if any problems should occur.  Please show the CHEMOTHERAPY ALERT CARD or IMMUNOTHERAPY ALERT CARD at check-in  to the Emergency Department and triage nurse.  Should you have questions after your visit or need to cancel or reschedule your appointment, please contact [facility] CANCER [place] AT [place]  [phone] and follow the prompts.  Office hours are 8:00 a.m. to 4:30 p.m. Monday - Friday. Please note that voicemails left after 4:00 p.m. may not be returned until the following business day.  We are closed weekends and major holidays. You have access to a nurse at all times for urgent questions. Please call the main number to the clinic [phone] and follow the prompts.  For any non-urgent questions, you may also contact your provider using MyChart. We now offer e-Visits for anyone 54 and older to request care online for non-urgent symptoms. For details visit [email or web address].   Also download the MyChart app! Go to the app store, search "MyChart", open the app, select [facility], and log in with your MyChart username and password.  Masks are optional in the cancer centers. If you would like for your care team to wear a mask while they are taking care of you, please let them know. For doctor visits, patients may have with them one support person who is at least 75 years old. At this time, visitors are not allowed in the infusion area.

## 2022-04-17 NOTE — Assessment & Plan Note (Signed)
patient will receive long-acting GCSF   Ziextenzo prophylaxis on D3 

## 2022-04-17 NOTE — Progress Notes (Signed)
Per Dr. Tasia Catchings, hold Mvasi until urine protein results are back. Ok to proceed with pre meds and Irinotecan.

## 2022-04-17 NOTE — Assessment & Plan Note (Signed)
Chemotherapy plan as listed above 

## 2022-04-17 NOTE — Progress Notes (Signed)
Pt here for follow up. Pt reports a small know to right leg.

## 2022-04-17 NOTE — Progress Notes (Signed)
Hematology/Oncology Progress note [phone] [phone]      Patient Care Team: Olin Hauser, DO as PCP - General (Family Medicine) Clent Jacks, RN as Registered Nurse Earlie Server, MD as Consulting Physician (Hematology and Oncology)  ASSESSMENT & PLAN:   Rectal cancer [facility]) History of stage IIIC Rectal cancer, s/p TNT, followed by 09/17/19 APR/posterior vaginectomy/TAH/BSO/VY-flap, pT4b pN0 with close vaginal margin 0.2 mm.  Uterus and ovaries negative for malignancy. palliative radiation to vaginal recurrence- 01/19/21 recurrence with lung metastasis.-Palliative -FOLFIRI plus bevacizumab.  Irinotecan was dropped in November 2022 due to side effects. Negative for UGT1A1*28 - radiographically stable, rise of CEA-  currently on dose reduced FOLFIRI-Bev Labs are reviewed and discussed with patient. Proceed with dose reduced FOLFIRI-[Irinotecan 100 mg/m2] / bevacizumab-currently she tolerates well Recommend short-term CT follow-up- July 2023 RAS wild type. Consider switch to irinotecan +panitumumab after progression .  Encounter for antineoplastic chemotherapy Chemotherapy plan as listed above.   History of pulmonary embolism Continue  Eliquis 2.5 mg twice daily for anticoagulation prophylaxis.  Hypokalemia  potassium is stable.  Continue potassium chloride 20 mEq twice daily.   Anemia due to antineoplastic chemotherapy  patient will receive long-acting GCSF  Ziextenzo prophylaxis on D3  Chemotherapy induced neutropenia ([place])  patient will receive long-acting GCSF  Ziextenzo prophylaxis on D3 No orders of the defined types were placed in this encounter.  Right lower extremity subcutaneous nodule, ?cyst, monitor   Follow up in 2 weeks. Lab covering MD next cycle of chemo All questions were answered. The patient knows to call the clinic with any problems, questions or concerns.  Earlie Server, MD, PhD [facility] Hematology Oncology 04/17/2022         CHIEF COMPLAINTS/REASON FOR VISIT:  Follow up for rectal cancer  HISTORY OF PRESENTING ILLNESS:  Patient initially presented with complaints of postmenopausal bleeding on 08/16/2018.  History of was menopausal vaginal bleeding in 2016 which resulted in cervical polypectomy.  Pathology 02/04/2015 showed cervical polyp, consistent with benign endometrial polyp.  [name] after polypectomy due to anxiety associated with pelvic exams.  pelvic exam on 08/16/2018 reviewed cervical abnormality and from enlarged uterus. Seen by Dr. Marcelline Mates on 10/29/2018.  Endometrial biopsy and a Pap smear was performed. 10/29/2018 Pap smear showed adenocarcinoma, favor endometrial origin. 10/29/2018 endometrial biopsy showed endometrioid carcinoma, FIGO grade 1.  10/29/2018- TA & TV Ultrasound revealed: Anteverted uterus measuring 8.7 x 5.6 x 6.4 cm without evidence of focal masses.  The endometrium measuring 24.1 mm (thickened) and heterogeneous.  Right and left ovaries not visualized.  No adnexal masses identified.  No free fluid in cul-de-sac.  Patient was seen by Dr. Theora Gianotti in clinic on 11/13/2018.  Cervical exam reveals 2 cm exophytic irregular mass consistent with malignancy.   11/19/2018 CT chest abdomen pelvis with contrast showed thickened endometrium with some irregularity compatible with the provided diagnosis of endometrial malignancy.  There is a mildly prominent left inguinal node 1.4 cm.  Patient was seen by Dr. Fransisca Connors on 11/20/2018 and left groin lymph node biopsy was recommended.  11/26/2018 patient underwent left inguinal lymph node biopsy. Pathology showed metastatic adenocarcinoma consistent with colorectal origin.  CDX 2+.  Case was discussed on tumor board.  Recommend colonoscopy for further evaluation.  Patient reports significant weight loss 30 pounds over the last year.  Chronic vaginal spotting. Change of bowel habits the past few months.  More constipated.  Family history  positive for brother who has colon cancer prostate cancer.  patient  has underwent colonoscopy on 12/03/2018 which reviewed a nonobstructing large mass in the rectum.  Also chronic fistula.  Mass was not circumferential.  This was biopsied with a cold forceps for histology.  Pathology came back hyperplastic polyp negative for dysplasia and malignancy. [name], patient underwent flex sigmoidoscopy on 12/06/2018 with rebiopsy of the rectal mass. This time biopsy results came back positive for invasive colorectal adenocarcinoma, moderately differentiated. Immunotherapy for nearly mismatch repair protein (MMR ) was performed.  There is no loss of MMR expression.  low probability of MSI high.   # Seen by [name] for evaluation of resectability for rectal cancer. In addition, she also had a second opinion with [facility] pathology where her endometrial biopsy pathology was changed to  adenocarcinoma, consistent with colorectal primary.   [name]. She has home health that has been assisting with ostomy care  Patient was also evaluated by [facility] oncology.  Recommendation is to proceed with TNT with concurrent chemoradiation followed by neoadjuvant chemotherapy followed by surgical resection. Patient prefers to have treatment done locally with [facility].   # Oncology Treatment:  02/03/2019- 03/19/2019  concurrent Xeloda and radiation.  Xeloda dose 863m /m2 BID - rounded to 16535mBID- on days of radiation. 04/09/2019, started on FOLFOX with bolus early.  Omitted.  07/16/2019 finished 8 cycles of FOLFOX. 09/17/19 APR/posterior vaginectomy/TAH/BSO/VY-flap pT4b pN0 with close vaginal margin 0.2 mm.  Uterus and ovaries negative for malignancy. Patient reports bilateral lower extremity numbness and tingling, intermittent, left worse than right. She has lost a lot of weight since her APR surgery.   #Family history with half brother having's history of colon  cancer prostate cancer.  Personal history of colorectal cancer.  Patient has not decided if she wants genetic testing.    # history of PE( 01/13/2020)  in the bilateral lower extremity DVT (01/13/2020).   She finishes 6 months of anticoagulation with Eliquis 5 mg twice daily. Now switched to Eliquis 2.5 mg twice daily..  # She has now developed recurrent disease. #06/30/20  vaginal introitus mass biopsied. Pathology is consistent with metastatic colorectal adenocarcinoma I have discussed with [name]  Dr. MaHester Matesnd the mass is not resectable. Patient has also had colonoscopy by Dr. AnVicente Malesesterday. Normal examination. # 07/16/2020 cycle 1 FOLFIRI  # 07/20/2020 PET scan was done for further evaluation, images are consistent with local recurrence, no distant metastasis. #Discussed with radiation oncology Dr. ChBaruch Goutyill recommends concurrent chemotherapy and radiation. 08/02/2020-08/16/2020, patient starts radiation.  Xeloda was held due to neutropenia 08/17/2020,-09/06/2020 Xeloda 1500 mg twice daily concurrently with radiation  01/31/21 started on FOLFIRI + Bev 05/18/2021 CT chest abdomen pelvis showed Previously noted enlargement of bilateral inguinal lymph nodes is resolved, consistent with treatment response of nodal metastatic disease. Interval decrease in size of multiple small bilateral pulmonary nodules, consistent with treatment response of pulmonary metastatic disease. No evidence of new metastatic disease. 05/24/2021 - 08/30/2021, continued on FOLFIRI plus bevacizumab.  Irinotecan dose was reduced, eventually 10076m2  09/02/2021, CT chest abdomen pelvis without contrast Showed small bilateral pulmonary nodules, unchanged.  Stable metastatic disease.  No noncontrast evidence of new metastatic disease in the chest abdomen pelvis.  Small parastomal hernia.  Enlargement of main pulmonary artery.  Coronary artery disease.  09/13/2021, maintenance 5-FU/bevacizumab 11/28/2021,  5-FU/Irinotecan/bevacizumab.  Irinotecan 100 mg/m2 was added back due to progressively increasing CEA.  INTERVAL HISTORY Jacqueline Yoder a [age]o. female who has above history reviewed  by me presents for follow-up of rectal cancer. Currently on dose reduced FOLFIRI/bevacizumab  overall she tolerates well.  Diarrhea is manageable and stable symptoms. Improved with antidiarrhea PRN.  She was accompanied by her daughter today.  No new complaints.  Appetite is good. Weight is relatively stable.  She noticed a small knot below right knee, non tender.   Review of Systems  Constitutional:  Positive for fatigue. Negative for appetite change, chills, fever and unexpected weight change.  HENT:   Negative for hearing loss and voice change.   Eyes:  Negative for eye problems.  Respiratory:  Negative for chest tightness and cough.   Cardiovascular:  Negative for chest pain.  Gastrointestinal:  Negative for abdominal distention, abdominal pain, blood in stool, constipation, diarrhea and nausea.  Endocrine: Negative for hot flashes.  Genitourinary:  Negative for difficulty urinating and frequency.   Musculoskeletal:  Negative for arthralgias.  Skin:  Negative for itching and rash.  Neurological:  Negative for extremity weakness and numbness.  Hematological:  Negative for adenopathy.  Psychiatric/Behavioral:  Negative for confusion.     MEDICAL HISTORY:  Past Medical History:  Diagnosis Date   Allergy    Arthritis    Blood clot in vein    Family history of colon cancer    GERD (gastroesophageal reflux disease)    Hypercholesteremia    Hypertension    Hypertension    Lower extremity edema    Personal history of chemotherapy    Rectal cancer ([place]) 12/2018   Urinary incontinence     SURGICAL HISTORY: Past Surgical History:  Procedure Laterality Date   ABDOMINAL HYSTERECTOMY     CHOLECYSTECTOMY  1971   COLONOSCOPY WITH PROPOFOL N/A 12/03/2018   Procedure: COLONOSCOPY WITH PROPOFOL;   Surgeon: Lucilla Lame, MD;  Location: [facility] ENDOSCOPY;  Service: Endoscopy;  Laterality: N/A;   COLONOSCOPY WITH PROPOFOL N/A 07/15/2020   Procedure: COLONOSCOPY WITH PROPOFOL;  Surgeon: [name], MD;  Location: [facility] ENDOSCOPY;  Service: Gastroenterology;  Laterality: N/A;   FLEXIBLE SIGMOIDOSCOPY N/A 12/06/2018   Procedure: FLEXIBLE SIGMOIDOSCOPY;  Surgeon: [name], MD;  Location: [facility] ENDOSCOPY;  Service: Endoscopy;  Laterality: N/A;   LAPAROSCOPIC COLOSTOMY  01/06/2019   PORTACATH PLACEMENT N/A 04/03/2019   Procedure: INSERTION PORT-A-CATH;  Surgeon: Jules Husbands, MD;  Location: [facility] ORS;  Service: General;  Laterality: N/A;    SOCIAL HISTORY: Social History   Socioeconomic History   Marital status: Widowed    Spouse name: Not on file   Number of children: Not on file   Years of education: Not on file   Highest education level: Not on file  Occupational History   Not on file  Tobacco Use   Smoking status: Former    Types: Cigarettes    Quit date: 12/02/1977    Years since quitting: 44.4   Smokeless tobacco: Former  Scientific laboratory technician Use: Never used  Substance and Sexual Activity   Alcohol use: Never   Drug use: Never   Sexual activity: Not Currently    Birth control/protection: None  Other Topics Concern   Not on file  Social History Narrative   Lives with daughter   Social Determinants of Health   Financial Resource Strain: [place]  (06/28/2021)   Overall Financial Resource Strain (CARDIA)    Difficulty of Paying Living Expenses: Not hard at all  Food Insecurity: No Food Insecurity (06/28/2021)   Hunger Vital Sign    Worried About Running Out of Food in the  Last Year: Never true    [place] in the Last Year: Never true  Transportation Needs: No Transportation Needs (06/28/2021)   PRAPARE - Hydrologist (Medical): No    Lack of Transportation (Non-Medical): No  Physical Activity: Inactive (06/28/2021)   Exercise Vital Sign     Days of Exercise per Week: 0 days    Minutes of Exercise per Session: 0 min  Stress: No Stress Concern Present (06/28/2021)   [place]    Feeling of Stress : Not at all  Social Connections: Not on file  Intimate Partner Violence: Not on file    FAMILY HISTORY: Family History  Problem Relation Age of Onset   Colon cancer Brother 40       exposure to chemicals [place]   Hypertension Mother    Stroke Mother    Kidney failure Father    Breast cancer Neg Hx    Ovarian cancer Neg Hx     ALLERGIES:  is allergic to sulfamethoxazole-trimethoprim.  MEDICATIONS:  Current Outpatient Medications  Medication Sig Dispense Refill   Cholecalciferol (VITAMIN D3) 2000 units capsule Take 2,000 Units by mouth daily.     cholestyramine (QUESTRAN) 4 g packet Take 1 packet (4 g total) by mouth 3 (three) times daily. 90 each 1   diclofenac sodium (VOLTAREN) 1 % GEL Apply 2 g topically 4 (four) times daily as needed (joint pain).  11   diphenoxylate-atropine (LOMOTIL) 2.5-0.025 MG tablet Take 1 tablet by mouth 4 (four) times daily as needed for diarrhea or loose stools. 30 tablet 1   ELIQUIS 2.5 MG TABS tablet TAKE 1 TABLET BY MOUTH TWICE  DAILY 60 tablet 11   fluticasone (FLONASE) 50 MCG/ACT nasal spray USE 1 SPRAY IN EACH NOSTRIL ONCE DAILY 16 g 3   gabapentin (NEURONTIN) 100 MG capsule Take 1 capsule (100 mg total) by mouth at bedtime. 30 capsule 0   ipratropium (ATROVENT) 0.03 % nasal spray Place 1 spray into both nostrils 2 (two) times daily. 30 mL 2   lidocaine-prilocaine (EMLA) cream Apply 1 application. topically as needed. 30 g 6   loperamide (IMODIUM) 2 MG capsule Take 1 capsule (2 mg total) by mouth See admin instructions. With onset of loose stool, take 45m followed by 231mevery 2 hours,  Maximum: 16 mg/day 120 capsule 1   loratadine (CLARITIN) 10 MG tablet Take 10 mg by mouth daily.     Multiple Vitamins-Minerals  (ONE-A-DAY WOMENS 50 PLUS PO) Take 1 tablet by mouth daily.      ondansetron (ZOFRAN) 8 MG tablet Take 1 tablet (8 mg total) by mouth 2 (two) times daily as needed for refractory nausea / vomiting. Start on day 3 after chemotherapy. 30 tablet 1   potassium chloride SA (KLOR-CON M) 20 MEQ tablet Take 2 tablets (40 mEq total) by mouth daily. 60 tablet 5   prochlorperazine (COMPAZINE) 10 MG tablet Take 1 tablet (10 mg total) by mouth every 6 (six) hours as needed (NAUSEA). 30 tablet 1   simvastatin (ZOCOR) 40 MG tablet TAKE 1 TABLET BY MOUTH AT  BEDTIME 90 tablet 1   triamterene-hydrochlorothiazide (DYAZIDE) 37.5-25 MG capsule TAKE 1 CAPSULE BY MOUTH  DAILY 90 capsule 1   zinc gluconate 50 MG tablet Take 50 mg by mouth daily.     No current facility-administered medications for this visit.   Facility-Administered Medications Ordered in Other Visits  Medication Dose Route Frequency  Provider Last Rate Last Admin   atropine injection 0.4 mg  0.4 mg Intravenous Once Earlie Server, MD       bevacizumab-awwb (MVASI) 350 mg in sodium chloride 0.9 % 100 mL chemo infusion  350 mg Intravenous Once Earlie Server, MD       diphenhydrAMINE (BENADRYL) injection 25 mg  25 mg Intravenous Daily PRN Earlie Server, MD       fluorouracil (ADRUCIL) 4,300 mg in sodium chloride 0.9 % 64 mL chemo infusion  2,400 mg/m2 (Order-Specific) Intravenous 1 day or 1 dose Earlie Server, MD       irinotecan (CAMPTOSAR) 180 mg in sodium chloride 0.9 % 500 mL chemo infusion  100 mg/m2 (Order-Specific) Intravenous Once Earlie Server, MD       leucovorin 750 mg in sodium chloride 0.9 % 250 mL infusion  750 mg Intravenous Once Earlie Server, MD       sodium chloride flush (NS) 0.9 % injection 10 mL  10 mL Intracatheter PRN Earlie Server, MD   10 mL at 03/22/22 1258     PHYSICAL EXAMINATION: ECOG PERFORMANCE STATUS: 1 - Symptomatic but completely ambulatory  Physical Exam Constitutional:      General: She is not in acute distress. HENT:     Head: Normocephalic and  atraumatic.  Eyes:     General: No scleral icterus. Cardiovascular:     Rate and Rhythm: Normal rate and regular rhythm.     Heart sounds: Normal heart sounds.  Pulmonary:     Effort: Pulmonary effort is normal. No respiratory distress.     Breath sounds: No wheezing.  Abdominal:     General: Bowel sounds are normal. There is no distension.     Palpations: Abdomen is soft.     Comments: + Colostomy bag   Musculoskeletal:        General: No deformity. Normal range of motion.     Cervical back: Normal range of motion and neck supple.     Comments: Right lower extremity subcutaneous cystic nodule, mobile, non tender  Skin:    General: Skin is warm and dry.     Findings: No erythema or rash.  Neurological:     Mental Status: She is alert and oriented to person, place, and time. Mental status is at baseline.     Cranial Nerves: No cranial nerve deficit.     Coordination: Coordination normal.       LABORATORY DATA:  I have reviewed the data as listed Lab Results  Component Value Date   WBC 6.1 04/17/2022   HGB 12.0 04/17/2022   HCT 37.5 04/17/2022   MCV 96.9 04/17/2022   PLT 195 04/17/2022   Recent Labs    03/20/22 0814 04/03/22 0814 04/17/22 0816  NA 139 139 139  K 4.1 4.4 4.2  CL 105 106 105  CO2 '27 28 26  ' GLUCOSE 92 92 91  BUN '10 10 11  ' CREATININE 0.89 0.88 0.92  CALCIUM 9.4 9.3 9.2  GFRNONAA >60 >60 >60  PROT 7.0 6.7 6.6  ALBUMIN 3.9 4.0 4.1  AST '26 26 29  ' ALT '16 15 19  ' ALKPHOS 72 67 75  BILITOT 0.2* 0.5 0.4    Iron/TIBC/Ferritin/ %Sat    Component Value Date/Time   IRON 40 06/23/2020 1124   TIBC 246 (L) 06/23/2020 1124   FERRITIN 338 (H) 06/23/2020 1124   IRONPCTSAT 16 06/23/2020 1124     RADIOGRAPHIC STUDIES: I have personally reviewed the radiological images as listed and agreed  with the findings in the report. CT CHEST ABDOMEN PELVIS W CONTRAST  Result Date: 01/20/2022 CLINICAL DATA:  Restaging rectal cancer.  Ongoing chemotherapy. * Tracking  Code: BO * EXAM: CT CHEST, ABDOMEN, AND PELVIS WITH CONTRAST TECHNIQUE: Multidetector CT imaging of the chest, abdomen and pelvis was performed following the standard protocol during bolus administration of intravenous contrast. RADIATION DOSE REDUCTION: This exam was performed according to the departmental dose-optimization program which includes automated exposure control, adjustment of the mA and/or kV according to patient size and/or use of iterative reconstruction technique. CONTRAST:  12m OMNIPAQUE IOHEXOL 300 MG/ML  SOLN COMPARISON:  Multiple exams, including 11/01/2021 FINDINGS: CT CHEST FINDINGS Cardiovascular: Right Port-A-Cath tip: Cavoatrial junction. Mildly prominent main pulmonary artery. Mediastinum/Nodes: Small left supraclavicular lymph nodes measuring up to 0.5 cm in short axis on image 8 series 2, stable. No current pathologic adenopathy in the chest. Lungs/Pleura: Scattered stable bilateral pulmonary nodules. An index 4 by 3 mm left upper lobe nodule previously measured 4 by 3 mm when measured in the same fashion. An index 6 by 4 by 6 mm right middle lobe pulmonary nodule is stable. No new pulmonary nodules are identified. Stable mild scarring posteriorly in the right lower lobe and inferiorly in the right middle lobe. Musculoskeletal: Unremarkable CT ABDOMEN PELVIS FINDINGS Hepatobiliary: Gallbladder absent. Mild but improved biliary dilatation, common bile duct currently 0.7 cm on image 61 series 2, formerly 0.9 cm at this location. No focal hepatic abnormality observed. Pancreas: Borderline dorsal pancreatic duct dilatation, improved from prior. Spleen: Unremarkable Adrenals/Urinary Tract: No change in bilateral renal cysts. Some of the hypodense renal lesions are too small to characterize. In general, such lesions are not felt to warrant further workup unless there is a specific renal clinical concern. Adrenal glands unremarkable. Stomach/Bowel: Prior APR with left colostomy. The wall  thickening in segments of the large bowel is probably attributable to nondistention. Small amount of fluid signal along the fatty tissues at the ostomy site, as before. Pelvic floor laxity with bulging small bowel loops posteriorly along the pelvic floor. Vascular/Lymphatic: Atherosclerosis is present, including aortoiliac atherosclerotic disease. No pathologic adenopathy. Reproductive: Prior hysterectomy.  Adnexa unremarkable. Other: No supplemental non-categorized findings. Musculoskeletal: Lumbar spondylosis and degenerative disc disease. IMPRESSION: 1. Stable scattered small pulmonary nodules. 2. The biliary dilatation shown on the prior exam is mildly improved. 3. Prior APR with left colostomy. As before there is a small amount of fluid density in the peristomal adipose tissues. 4.  Aortic Atherosclerosis (ICD10-I70.0). 5. Pelvic floor laxity with bulging of small bowel loops along the pelvic floor. 6. Mildly prominent main pulmonary artery may indicate pulmonary arterial hypertension. Electronically Signed   By: WVan ClinesM.D.   On: 01/20/2022 10:32      CT CHEST ABDOMEN PELVIS W CONTRAST  Result Date: 01/20/2022 CLINICAL DATA:  Restaging rectal cancer.  Ongoing chemotherapy. * Tracking Code: BO * EXAM: CT CHEST, ABDOMEN, AND PELVIS WITH CONTRAST TECHNIQUE: Multidetector CT imaging of the chest, abdomen and pelvis was performed following the standard protocol during bolus administration of intravenous contrast. RADIATION DOSE REDUCTION: This exam was performed according to the departmental dose-optimization program which includes automated exposure control, adjustment of the mA and/or kV according to patient size and/or use of iterative reconstruction technique. CONTRAST:  863mOMNIPAQUE IOHEXOL 300 MG/ML  SOLN COMPARISON:  Multiple exams, including 11/01/2021 FINDINGS: CT CHEST FINDINGS Cardiovascular: Right Port-A-Cath tip: Cavoatrial junction. Mildly prominent main pulmonary artery.  Mediastinum/Nodes: Small left supraclavicular lymph nodes measuring up to  0.5 cm in short axis on image 8 series 2, stable. No current pathologic adenopathy in the chest. Lungs/Pleura: Scattered stable bilateral pulmonary nodules. An index 4 by 3 mm left upper lobe nodule previously measured 4 by 3 mm when measured in the same fashion. An index 6 by 4 by 6 mm right middle lobe pulmonary nodule is stable. No new pulmonary nodules are identified. Stable mild scarring posteriorly in the right lower lobe and inferiorly in the right middle lobe. Musculoskeletal: Unremarkable CT ABDOMEN PELVIS FINDINGS Hepatobiliary: Gallbladder absent. Mild but improved biliary dilatation, common bile duct currently 0.7 cm on image 61 series 2, formerly 0.9 cm at this location. No focal hepatic abnormality observed. Pancreas: Borderline dorsal pancreatic duct dilatation, improved from prior. Spleen: Unremarkable Adrenals/Urinary Tract: No change in bilateral renal cysts. Some of the hypodense renal lesions are too small to characterize. In general, such lesions are not felt to warrant further workup unless there is a specific renal clinical concern. Adrenal glands unremarkable. Stomach/Bowel: Prior APR with left colostomy. The wall thickening in segments of the large bowel is probably attributable to nondistention. Small amount of fluid signal along the fatty tissues at the ostomy site, as before. Pelvic floor laxity with bulging small bowel loops posteriorly along the pelvic floor. Vascular/Lymphatic: Atherosclerosis is present, including aortoiliac atherosclerotic disease. No pathologic adenopathy. Reproductive: Prior hysterectomy.  Adnexa unremarkable. Other: No supplemental non-categorized findings. Musculoskeletal: Lumbar spondylosis and degenerative disc disease. IMPRESSION: 1. Stable scattered small pulmonary nodules. 2. The biliary dilatation shown on the prior exam is mildly improved. 3. Prior APR with left colostomy. As before  there is a small amount of fluid density in the peristomal adipose tissues. 4.  Aortic Atherosclerosis (ICD10-I70.0). 5. Pelvic floor laxity with bulging of small bowel loops along the pelvic floor. 6. Mildly prominent main pulmonary artery may indicate pulmonary arterial hypertension. Electronically Signed   By: Van Clines M.D.   On: 01/20/2022 10:32

## 2022-04-17 NOTE — Assessment & Plan Note (Signed)
potassium is stable.  Continue potassium chloride 20 mEq twice daily.

## 2022-04-17 NOTE — Assessment & Plan Note (Addendum)
History of stage IIIC Rectal cancer, s/p TNT, followed by 09/17/19 APR/posterior vaginectomy/TAH/BSO/VY-flap, pT4b pN0 with close vaginal margin 0.2 mm.  Uterus and ovaries negative for malignancy. palliative radiation to vaginal recurrence- 01/19/21 recurrence with lung metastasis.-Palliative -FOLFIRI plus bevacizumab.  Irinotecan was dropped in November 2022 due to side effects. Negative for UGT1A1*28 - radiographically stable, rise of CEA-  currently on dose reduced FOLFIRI-Bev Labs are reviewed and discussed with patient. Proceed with dose reduced FOLFIRI-[Irinotecan 100 mg/m2] / bevacizumab-currently she tolerates well Recommend short-term CT follow-up- July 2023 RAS wild type. Consider switch to irinotecan +panitumumab after progression .

## 2022-04-18 LAB — CEA: CEA: 1310 ng/mL — ABNORMAL HIGH (ref 0.0–4.7)

## 2022-04-19 ENCOUNTER — Inpatient Hospital Stay: Payer: Medicare Other

## 2022-04-19 VITALS — BP 125/63 | HR 59 | Temp 97.3°F | Resp 18

## 2022-04-19 DIAGNOSIS — R197 Diarrhea, unspecified: Secondary | ICD-10-CM | POA: Diagnosis not present

## 2022-04-19 DIAGNOSIS — R634 Abnormal weight loss: Secondary | ICD-10-CM | POA: Diagnosis not present

## 2022-04-19 DIAGNOSIS — E7800 Pure hypercholesterolemia, unspecified: Secondary | ICD-10-CM | POA: Diagnosis not present

## 2022-04-19 DIAGNOSIS — K435 Parastomal hernia without obstruction or  gangrene: Secondary | ICD-10-CM | POA: Diagnosis not present

## 2022-04-19 DIAGNOSIS — I10 Essential (primary) hypertension: Secondary | ICD-10-CM | POA: Diagnosis not present

## 2022-04-19 DIAGNOSIS — Z5111 Encounter for antineoplastic chemotherapy: Secondary | ICD-10-CM | POA: Diagnosis not present

## 2022-04-19 DIAGNOSIS — Z9221 Personal history of antineoplastic chemotherapy: Secondary | ICD-10-CM | POA: Diagnosis not present

## 2022-04-19 DIAGNOSIS — Z79899 Other long term (current) drug therapy: Secondary | ICD-10-CM | POA: Diagnosis not present

## 2022-04-19 DIAGNOSIS — Z86711 Personal history of pulmonary embolism: Secondary | ICD-10-CM | POA: Diagnosis not present

## 2022-04-19 DIAGNOSIS — C20 Malignant neoplasm of rectum: Secondary | ICD-10-CM

## 2022-04-19 DIAGNOSIS — E876 Hypokalemia: Secondary | ICD-10-CM | POA: Diagnosis not present

## 2022-04-19 DIAGNOSIS — C799 Secondary malignant neoplasm of unspecified site: Secondary | ICD-10-CM

## 2022-04-19 DIAGNOSIS — K219 Gastro-esophageal reflux disease without esophagitis: Secondary | ICD-10-CM | POA: Diagnosis not present

## 2022-04-19 DIAGNOSIS — D6481 Anemia due to antineoplastic chemotherapy: Secondary | ICD-10-CM | POA: Diagnosis not present

## 2022-04-19 DIAGNOSIS — I2510 Atherosclerotic heart disease of native coronary artery without angina pectoris: Secondary | ICD-10-CM | POA: Diagnosis not present

## 2022-04-19 DIAGNOSIS — Z86718 Personal history of other venous thrombosis and embolism: Secondary | ICD-10-CM | POA: Diagnosis not present

## 2022-04-19 DIAGNOSIS — Z7901 Long term (current) use of anticoagulants: Secondary | ICD-10-CM | POA: Diagnosis not present

## 2022-04-19 DIAGNOSIS — D702 Other drug-induced agranulocytosis: Secondary | ICD-10-CM | POA: Diagnosis not present

## 2022-04-19 DIAGNOSIS — Z933 Colostomy status: Secondary | ICD-10-CM | POA: Diagnosis not present

## 2022-04-19 DIAGNOSIS — T451X5A Adverse effect of antineoplastic and immunosuppressive drugs, initial encounter: Secondary | ICD-10-CM | POA: Diagnosis not present

## 2022-04-19 MED ORDER — SODIUM CHLORIDE 0.9% FLUSH
10.0000 mL | INTRAVENOUS | Status: DC | PRN
  Administered 2022-04-19: 10 mL
  Filled 2022-04-19: qty 10

## 2022-04-19 MED ORDER — PEGFILGRASTIM INJECTION 6 MG/0.6ML ~~LOC~~
6.0000 mg | PREFILLED_SYRINGE | Freq: Once | SUBCUTANEOUS | Status: AC
  Administered 2022-04-19: 6 mg via SUBCUTANEOUS
  Filled 2022-04-19: qty 0.6

## 2022-04-19 MED ORDER — HEPARIN SOD (PORK) LOCK FLUSH 100 UNIT/ML IV SOLN
500.0000 [IU] | Freq: Once | INTRAVENOUS | Status: AC | PRN
  Administered 2022-04-19: 500 [IU]
  Filled 2022-04-19: qty 5

## 2022-04-24 ENCOUNTER — Telehealth: Payer: Self-pay

## 2022-04-24 NOTE — Telephone Encounter (Signed)
Please schedule patient for Lab/MD/FOLFIRI+Bev on 7/31 D3 pump dc +zietenzo. Please notify patient of appt.

## 2022-04-24 NOTE — Telephone Encounter (Signed)
-----   Message from Earlie Server, MD sent at 04/23/2022  9:38 PM EDT ----- She sees Dr.B on 7/17.  Please schedule 7/31 Lab MD FOLFIRI + Bevacizumab, D3 pump dc, + Ziextenzo

## 2022-04-25 ENCOUNTER — Encounter: Payer: Self-pay | Admitting: Oncology

## 2022-04-26 ENCOUNTER — Ambulatory Visit
Admission: RE | Admit: 2022-04-26 | Discharge: 2022-04-26 | Disposition: A | Discharge status: Home / Self Care | Payer: Medicare Other | Source: Ambulatory Visit | Attending: Oncology | Admitting: Oncology

## 2022-04-26 DIAGNOSIS — C20 Malignant neoplasm of rectum: Secondary | ICD-10-CM | POA: Diagnosis not present

## 2022-04-26 DIAGNOSIS — C799 Secondary malignant neoplasm of unspecified site: Secondary | ICD-10-CM | POA: Insufficient documentation

## 2022-04-26 DIAGNOSIS — K838 Other specified diseases of biliary tract: Secondary | ICD-10-CM | POA: Diagnosis not present

## 2022-04-26 DIAGNOSIS — J9811 Atelectasis: Secondary | ICD-10-CM | POA: Diagnosis not present

## 2022-04-26 DIAGNOSIS — R918 Other nonspecific abnormal finding of lung field: Secondary | ICD-10-CM | POA: Diagnosis not present

## 2022-04-26 MED ORDER — IOHEXOL 300 MG/ML  SOLN
100.0000 mL | Freq: Once | INTRAMUSCULAR | Status: AC | PRN
  Administered 2022-04-26: 100 mL via INTRAVENOUS

## 2022-04-28 DIAGNOSIS — Z933 Colostomy status: Secondary | ICD-10-CM | POA: Diagnosis not present

## 2022-05-01 ENCOUNTER — Encounter: Payer: Self-pay | Admitting: Internal Medicine

## 2022-05-01 ENCOUNTER — Ambulatory Visit: Payer: Medicare Other | Admitting: Medical Oncology

## 2022-05-01 ENCOUNTER — Inpatient Hospital Stay (HOSPITAL_BASED_OUTPATIENT_CLINIC_OR_DEPARTMENT_OTHER): Payer: Medicare Other | Admitting: Internal Medicine

## 2022-05-01 ENCOUNTER — Inpatient Hospital Stay: Payer: Medicare Other

## 2022-05-01 ENCOUNTER — Other Ambulatory Visit: Payer: Self-pay | Admitting: Emergency Medicine

## 2022-05-01 DIAGNOSIS — C20 Malignant neoplasm of rectum: Secondary | ICD-10-CM | POA: Diagnosis not present

## 2022-05-01 DIAGNOSIS — T451X5A Adverse effect of antineoplastic and immunosuppressive drugs, initial encounter: Secondary | ICD-10-CM | POA: Diagnosis not present

## 2022-05-01 DIAGNOSIS — Z9221 Personal history of antineoplastic chemotherapy: Secondary | ICD-10-CM | POA: Diagnosis not present

## 2022-05-01 DIAGNOSIS — I2510 Atherosclerotic heart disease of native coronary artery without angina pectoris: Secondary | ICD-10-CM | POA: Diagnosis not present

## 2022-05-01 DIAGNOSIS — Z7901 Long term (current) use of anticoagulants: Secondary | ICD-10-CM | POA: Diagnosis not present

## 2022-05-01 DIAGNOSIS — Z86718 Personal history of other venous thrombosis and embolism: Secondary | ICD-10-CM | POA: Diagnosis not present

## 2022-05-01 DIAGNOSIS — D702 Other drug-induced agranulocytosis: Secondary | ICD-10-CM | POA: Diagnosis not present

## 2022-05-01 DIAGNOSIS — K219 Gastro-esophageal reflux disease without esophagitis: Secondary | ICD-10-CM | POA: Diagnosis not present

## 2022-05-01 DIAGNOSIS — I10 Essential (primary) hypertension: Secondary | ICD-10-CM | POA: Diagnosis not present

## 2022-05-01 DIAGNOSIS — R197 Diarrhea, unspecified: Secondary | ICD-10-CM | POA: Diagnosis not present

## 2022-05-01 DIAGNOSIS — Z79899 Other long term (current) drug therapy: Secondary | ICD-10-CM | POA: Diagnosis not present

## 2022-05-01 DIAGNOSIS — C799 Secondary malignant neoplasm of unspecified site: Secondary | ICD-10-CM | POA: Diagnosis not present

## 2022-05-01 DIAGNOSIS — K435 Parastomal hernia without obstruction or  gangrene: Secondary | ICD-10-CM | POA: Diagnosis not present

## 2022-05-01 DIAGNOSIS — Z933 Colostomy status: Secondary | ICD-10-CM | POA: Diagnosis not present

## 2022-05-01 DIAGNOSIS — D6481 Anemia due to antineoplastic chemotherapy: Secondary | ICD-10-CM | POA: Diagnosis not present

## 2022-05-01 DIAGNOSIS — E876 Hypokalemia: Secondary | ICD-10-CM | POA: Diagnosis not present

## 2022-05-01 DIAGNOSIS — R634 Abnormal weight loss: Secondary | ICD-10-CM | POA: Diagnosis not present

## 2022-05-01 DIAGNOSIS — Z86711 Personal history of pulmonary embolism: Secondary | ICD-10-CM | POA: Diagnosis not present

## 2022-05-01 DIAGNOSIS — Z5111 Encounter for antineoplastic chemotherapy: Secondary | ICD-10-CM | POA: Diagnosis not present

## 2022-05-01 DIAGNOSIS — E7800 Pure hypercholesterolemia, unspecified: Secondary | ICD-10-CM | POA: Diagnosis not present

## 2022-05-01 LAB — COMPREHENSIVE METABOLIC PANEL
ALT: 15 U/L (ref 0–44)
AST: 26 U/L (ref 15–41)
Albumin: 4 g/dL (ref 3.5–5.0)
Alkaline Phosphatase: 76 U/L (ref 38–126)
Anion gap: 6 (ref 5–15)
BUN: 10 mg/dL (ref 8–23)
CO2: 28 mmol/L (ref 22–32)
Calcium: 9.2 mg/dL (ref 8.9–10.3)
Chloride: 105 mmol/L (ref 98–111)
Creatinine, Ser: 0.97 mg/dL (ref 0.44–1.00)
GFR, Estimated: >60 mL/min (ref >60)
Glucose, Bld: 106 mg/dL — ABNORMAL HIGH (ref 70–99)
Potassium: 4.1 mmol/L (ref 3.5–5.1)
Sodium: 139 mmol/L (ref 135–145)
Total Bilirubin: 0.4 mg/dL (ref 0.3–1.2)
Total Protein: 6.7 g/dL (ref 6.5–8.1)

## 2022-05-01 LAB — CBC WITH DIFFERENTIAL/PLATELET
Abs Immature Granulocytes: 0.07 10*3/uL (ref 0.00–0.07)
Basophils Absolute: 0 10*3/uL (ref 0.0–0.1)
Basophils Relative: 1 %
Eosinophils Absolute: 0.1 10*3/uL (ref 0.0–0.5)
Eosinophils Relative: 2 %
HCT: 38.6 % (ref 36.0–46.0)
Hemoglobin: 12 g/dL (ref 12.0–15.0)
Immature Granulocytes: 1 %
Lymphocytes Relative: 20 %
Lymphs Abs: 1.2 10*3/uL (ref 0.7–4.0)
MCH: 30.2 pg (ref 26.0–34.0)
MCHC: 31.1 g/dL (ref 30.0–36.0)
MCV: 97.2 fL (ref 80.0–100.0)
Monocytes Absolute: 0.6 10*3/uL (ref 0.1–1.0)
Monocytes Relative: 10 %
Neutro Abs: 4.2 10*3/uL (ref 1.7–7.7)
Neutrophils Relative %: 66 %
Platelets: 219 10*3/uL (ref 150–400)
RBC: 3.97 MIL/uL (ref 3.87–5.11)
RDW: 17.3 % — ABNORMAL HIGH (ref 11.5–15.5)
WBC: 6.2 10*3/uL (ref 4.0–10.5)
nRBC: 0 % (ref 0.0–0.2)

## 2022-05-01 LAB — PROTEIN, URINE, RANDOM: Total Protein, Urine: 16 mg/dL

## 2022-05-01 MED ORDER — DIPHENHYDRAMINE HCL 50 MG/ML IJ SOLN
25.0000 mg | Freq: Every day | INTRAMUSCULAR | Status: DC | PRN
  Administered 2022-05-01: 25 mg via INTRAVENOUS
  Filled 2022-05-01: qty 1

## 2022-05-01 MED ORDER — SODIUM CHLORIDE 0.9 % IV SOLN
2400.0000 mg/m2 | INTRAVENOUS | Status: DC
  Administered 2022-05-01: 4300 mg via INTRAVENOUS
  Filled 2022-05-01: qty 86

## 2022-05-01 MED ORDER — SODIUM CHLORIDE 0.9 % IV SOLN
350.0000 mg | Freq: Once | INTRAVENOUS | Status: AC
  Administered 2022-05-01: 350 mg via INTRAVENOUS
  Filled 2022-05-01: qty 14

## 2022-05-01 MED ORDER — SODIUM CHLORIDE 0.9 % IV SOLN
10.0000 mg | Freq: Once | INTRAVENOUS | Status: AC
  Administered 2022-05-01: 10 mg via INTRAVENOUS
  Filled 2022-05-01: qty 1

## 2022-05-01 MED ORDER — SODIUM CHLORIDE 0.9 % IV SOLN
750.0000 mg | Freq: Once | INTRAVENOUS | Status: AC
  Administered 2022-05-01: 750 mg via INTRAVENOUS
  Filled 2022-05-01: qty 37.5

## 2022-05-01 MED ORDER — HEPARIN SOD (PORK) LOCK FLUSH 100 UNIT/ML IV SOLN
500.0000 [IU] | Freq: Once | INTRAVENOUS | Status: DC
  Filled 2022-05-01: qty 5

## 2022-05-01 MED ORDER — ATROPINE SULFATE 1 MG/ML IV SOLN
0.4000 mg | Freq: Once | INTRAVENOUS | Status: AC
  Administered 2022-05-01: 0.4 mg via INTRAVENOUS
  Filled 2022-05-01: qty 1

## 2022-05-01 MED ORDER — SODIUM CHLORIDE 0.9 % IV SOLN
Freq: Once | INTRAVENOUS | Status: AC
  Filled 2022-05-01: qty 250

## 2022-05-01 MED ORDER — PALONOSETRON HCL INJECTION 0.25 MG/5ML
0.2500 mg | Freq: Once | INTRAVENOUS | Status: AC
  Administered 2022-05-01: 0.25 mg via INTRAVENOUS
  Filled 2022-05-01: qty 5

## 2022-05-01 MED ORDER — SODIUM CHLORIDE 0.9 % IV SOLN
100.0000 mg/m2 | Freq: Once | INTRAVENOUS | Status: AC
  Administered 2022-05-01: 180 mg via INTRAVENOUS
  Filled 2022-05-01: qty 5

## 2022-05-01 MED ORDER — SODIUM CHLORIDE 0.9% FLUSH
10.0000 mL | Freq: Once | INTRAVENOUS | Status: AC
  Administered 2022-05-01: 10 mL via INTRAVENOUS
  Filled 2022-05-01: qty 10

## 2022-05-01 NOTE — Progress Notes (Signed)
[place] NOTE  Patient Care Team: Olin Hauser, DO as PCP - General (Family Medicine) Clent Jacks, RN as Registered Nurse Earlie Server, MD as Consulting Physician (Hematology and Oncology) Cammie Sickle, MD as Consulting Physician (Oncology)  CHIEF COMPLAINTS/PURPOSE OF CONSULTATION: Rectal cancer  Oncology History  Rectal cancer ([place])  01/23/2019 Initial Diagnosis   Rectal cancer ([place])   02/04/2019 - 02/04/2019 Chemotherapy   The patient had capecitabine (XELODA) 150 MG tablet, 150 mg (100 % of original dose 150 mg), Oral, 2 times daily after meals, 1 of 1 cycle, Start date: 01/27/2019, End date: 04/03/2019 Dose modification: 150 mg (original dose 150 mg, Cycle 1) capecitabine (XELODA) 500 MG tablet, 1,500 mg (100 % of original dose 1,500 mg), Oral, 2 times daily after meals, 1 of 1 cycle, Start date: 01/27/2019, End date: 04/03/2019 Dose modification: 1,500 mg (original dose 1,500 mg, Cycle 1)  for chemotherapy treatment.    04/09/2019 - 07/18/2019 Chemotherapy   The patient had palonosetron (ALOXI) injection 0.25 mg, 0.25 mg, Intravenous,  Once, 8 of 8 cycles Administration: 0.25 mg (04/09/2019), 0.25 mg (04/23/2019), 0.25 mg (05/07/2019), 0.25 mg (05/21/2019), 0.25 mg (06/04/2019), 0.25 mg (06/18/2019), 0.25 mg (07/02/2019), 0.25 mg (07/16/2019) leucovorin 850 mg in dextrose 5 % 250 mL infusion, 844 mg, Intravenous,  Once, 8 of 8 cycles Administration: 850 mg (04/09/2019), 850 mg (04/23/2019), 850 mg (05/07/2019), 850 mg (05/21/2019), 800 mg (06/18/2019), 800 mg (07/02/2019), 800 mg (07/16/2019) oxaliplatin (ELOXATIN) 150 mg in dextrose 5 % 500 mL chemo infusion, 71 mg/m2 = 160 mg (100 % of original dose 75 mg/m2), Intravenous,  Once, 8 of 8 cycles Dose modification: 75 mg/m2 (original dose 75 mg/m2, Cycle 1, Reason: Provider Judgment) Administration: 150 mg (04/09/2019), 150 mg (04/23/2019), 150 mg (05/07/2019), 150 mg (05/21/2019), 150 mg (06/04/2019), 150 mg (06/18/2019),  150 mg (07/02/2019), 150 mg (07/16/2019) fluorouracil (ADRUCIL) 5,000 mg in sodium chloride 0.9 % 150 mL chemo infusion, 2,370 mg/m2 = 5,050 mg, Intravenous, 1 Day/Dose, 8 of 8 cycles Administration: 5,000 mg (04/09/2019), 5,000 mg (04/23/2019), 5,000 mg (05/07/2019), 5,000 mg (05/21/2019), 5,000 mg (06/04/2019), 5,000 mg (06/18/2019), 5,000 mg (07/02/2019), 5,000 mg (07/16/2019)  for chemotherapy treatment.    10/06/2019 Cancer Staging   Staging form: Colon and Rectum, AJCC 8th Edition - Pathologic stage from 10/06/2019: Stage IIC (ypT4b, pN0, cM0) - Signed by Earlie Server, MD on 10/06/2019   06/30/2020 Cancer Staging   Staging form: Colon and Rectum, AJCC 8th Edition - Clinical stage from 06/30/2020: [name] - Signed by Earlie Server, MD on 04/17/2022 Stage prefix: Recurrence Total positive nodes: 1   07/16/2020 - 07/19/2020 Chemotherapy         01/31/2021 -  Chemotherapy   Patient is on Treatment Plan : COLORECTAL FOLFIRI / BEVACIZUMAB Q14D     01/31/2021 Cancer Staging   Staging form: Colon and Rectum, AJCC 8th Edition - Pathologic: Stage Unknown (rpTX, pNX, cM1) - Signed by Earlie Server, MD on 01/31/2021 Stage prefix: Recurrence   Metastatic adenocarcinoma ([place])  09/01/2019 Initial Diagnosis   Metastatic adenocarcinoma ([place])   01/31/2021 -  Chemotherapy   Patient is on Treatment Plan : COLORECTAL FOLFIRI / BEVACIZUMAB Q14D        HISTORY OF PRESENTING ILLNESS: Ambulating independently.  Accompanied by daughter.  Jacqueline Yoder 76 y.o.  female with history of rectal cancer metastatic to lung-currently on dose reduced FOLFIRI/bevacizumab is here to follow-up/review results of CT scan.  Denies any nausea or vomiting.  Mild diarrhea improved with Imodium.  Denies any significant nausea vomiting.  No fevers or chills.  Review of Systems  Constitutional:  Negative for chills, diaphoresis, fever, malaise/fatigue and weight loss.  HENT:  Negative for nosebleeds and sore throat.   Eyes:  Negative  for double vision.  Respiratory:  Negative for cough, hemoptysis, sputum production, shortness of breath and wheezing.   Cardiovascular:  Negative for chest pain, palpitations, orthopnea and leg swelling.  Gastrointestinal:  Positive for diarrhea. Negative for abdominal pain, blood in stool, constipation, heartburn, melena, nausea and vomiting.  Genitourinary:  Negative for dysuria, frequency and urgency.  Musculoskeletal:  Negative for back pain and joint pain.  Skin: Negative.  Negative for itching and rash.  Neurological:  Negative for dizziness, tingling, focal weakness, weakness and headaches.  Endo/Heme/Allergies:  Does not bruise/bleed easily.  Psychiatric/Behavioral:  Negative for depression. The patient is not nervous/anxious and does not have insomnia.     MEDICAL HISTORY:  Past Medical History:  Diagnosis Date   Allergy    Arthritis    Blood clot in vein    Family history of colon cancer    GERD (gastroesophageal reflux disease)    Hypercholesteremia    Hypertension    Hypertension    Lower extremity edema    Personal history of chemotherapy    Rectal cancer ([place]) 12/2018   Urinary incontinence     SURGICAL HISTORY: Past Surgical History:  Procedure Laterality Date   ABDOMINAL HYSTERECTOMY     CHOLECYSTECTOMY  1971   COLONOSCOPY WITH PROPOFOL N/A 12/03/2018   Procedure: COLONOSCOPY WITH PROPOFOL;  Surgeon: Lucilla Lame, MD;  Location: [facility] ENDOSCOPY;  Service: Endoscopy;  Laterality: N/A;   COLONOSCOPY WITH PROPOFOL N/A 07/15/2020   Procedure: COLONOSCOPY WITH PROPOFOL;  Surgeon: [name], MD;  Location: [facility] ENDOSCOPY;  Service: Gastroenterology;  Laterality: N/A;   FLEXIBLE SIGMOIDOSCOPY N/A 12/06/2018   Procedure: FLEXIBLE SIGMOIDOSCOPY;  Surgeon: [name], MD;  Location: [facility] ENDOSCOPY;  Service: Endoscopy;  Laterality: N/A;   LAPAROSCOPIC COLOSTOMY  01/06/2019   PORTACATH PLACEMENT N/A 04/03/2019   Procedure: INSERTION PORT-A-CATH;  Surgeon: Jules Husbands,  MD;  Location: [facility] ORS;  Service: General;  Laterality: N/A;    SOCIAL HISTORY: Social History   Socioeconomic History   Marital status: Widowed    Spouse name: Not on file   Number of children: Not on file   Years of education: Not on file   Highest education level: Not on file  Occupational History   Not on file  Tobacco Use   Smoking status: Former    Types: Cigarettes    Quit date: 12/02/1977    Years since quitting: 44.4   Smokeless tobacco: Former  Scientific laboratory technician Use: Never used  Substance and Sexual Activity   Alcohol use: Never   Drug use: Never   Sexual activity: Not Currently    Birth control/protection: None  Other Topics Concern   Not on file  Social History Narrative   Lives with daughter   Social Determinants of Health   Financial Resource Strain: Low Risk  (06/28/2021)   Overall Financial Resource Strain (CARDIA)    Difficulty of Paying Living Expenses: Not hard at all  Food Insecurity: No Food Insecurity (06/28/2021)   Hunger Vital Sign    Worried About Running Out of Food in the Last Year: Never true    [place] in the Last Year: Never true  Transportation Needs: No Transportation Needs (06/28/2021)  PRAPARE - Hydrologist (Medical): No    Lack of Transportation (Non-Medical): No  Physical Activity: Inactive (06/28/2021)   Exercise Vital Sign    Days of Exercise per Week: 0 days    Minutes of Exercise per Session: 0 min  Stress: No Stress Concern Present (06/28/2021)   [place]    Feeling of Stress : Not at all  Social Connections: Not on file  Intimate Partner Violence: Not on file    FAMILY HISTORY: Family History  Problem Relation Age of Onset   Colon cancer Brother 7       exposure to chemicals [place]   Hypertension Mother    Stroke Mother    Kidney failure Father    Breast cancer Neg Hx    Ovarian cancer Neg Hx      ALLERGIES:  is allergic to sulfamethoxazole-trimethoprim.  MEDICATIONS:  Current Outpatient Medications  Medication Sig Dispense Refill   Cholecalciferol (VITAMIN D3) 2000 units capsule Take 2,000 Units by mouth daily.     cholestyramine (QUESTRAN) 4 g packet Take 1 packet (4 g total) by mouth 3 (three) times daily. 90 each 1   diclofenac sodium (VOLTAREN) 1 % GEL Apply 2 g topically 4 (four) times daily as needed (joint pain).  11   diphenoxylate-atropine (LOMOTIL) 2.5-0.025 MG tablet Take 1 tablet by mouth 4 (four) times daily as needed for diarrhea or loose stools. 30 tablet 1   ELIQUIS 2.5 MG TABS tablet TAKE 1 TABLET BY MOUTH TWICE  DAILY 60 tablet 11   fluticasone (FLONASE) 50 MCG/ACT nasal spray USE 1 SPRAY IN EACH NOSTRIL ONCE DAILY 16 g 3   gabapentin (NEURONTIN) 100 MG capsule Take 1 capsule (100 mg total) by mouth at bedtime. 30 capsule 0   ipratropium (ATROVENT) 0.03 % nasal spray Place 1 spray into both nostrils 2 (two) times daily. 30 mL 2   lidocaine-prilocaine (EMLA) cream Apply 1 application. topically as needed. 30 g 6   loperamide (IMODIUM) 2 MG capsule Take 1 capsule (2 mg total) by mouth See admin instructions. With onset of loose stool, take '4mg'$  followed by '2mg'$  every 2 hours,  Maximum: 16 mg/day 120 capsule 1   loratadine (CLARITIN) 10 MG tablet Take 10 mg by mouth daily.     Multiple Vitamins-Minerals (ONE-A-DAY WOMENS 50 PLUS PO) Take 1 tablet by mouth daily.      ondansetron (ZOFRAN) 8 MG tablet Take 1 tablet (8 mg total) by mouth 2 (two) times daily as needed for refractory nausea / vomiting. Start on day 3 after chemotherapy. 30 tablet 1   potassium chloride SA (KLOR-CON M) 20 MEQ tablet Take 2 tablets (40 mEq total) by mouth daily. 60 tablet 5   prochlorperazine (COMPAZINE) 10 MG tablet Take 1 tablet (10 mg total) by mouth every 6 (six) hours as needed (NAUSEA). 30 tablet 1   simvastatin (ZOCOR) 40 MG tablet TAKE 1 TABLET BY MOUTH AT  BEDTIME 90 tablet 1    triamterene-hydrochlorothiazide (DYAZIDE) 37.5-25 MG capsule TAKE 1 CAPSULE BY MOUTH  DAILY 90 capsule 1   zinc gluconate 50 MG tablet Take 50 mg by mouth daily.     No current facility-administered medications for this visit.   Facility-Administered Medications Ordered in Other Visits  Medication Dose Route Frequency Provider Last Rate Last Admin   sodium chloride flush (NS) 0.9 % injection 10 mL  10 mL Intracatheter PRN Earlie Server, MD  10 mL at 03/22/22 1258    PHYSICAL EXAMINATION: ECOG PERFORMANCE STATUS: 1 - Symptomatic but completely ambulatory  Vitals:   05/01/22 0901  BP: 123/64  Pulse: 67  Temp: (!) 97.3 F (36.3 C)  SpO2: 100%   Filed Weights   05/01/22 0901  Weight: 158 lb 6.4 oz (71.8 kg)    Physical Exam Vitals and nursing note reviewed.  HENT:     Head: Normocephalic and atraumatic.     Mouth/Throat:     Pharynx: Oropharynx is clear.  Eyes:     Extraocular Movements: Extraocular movements intact.     Pupils: Pupils are equal, round, and reactive to light.  Cardiovascular:     Rate and Rhythm: Normal rate and regular rhythm.  Pulmonary:     Comments: Decreased breath sounds bilaterally.  Abdominal:     Palpations: Abdomen is soft.  Musculoskeletal:        General: Normal range of motion.     Cervical back: Normal range of motion.  Skin:    General: Skin is warm.  Neurological:     General: No focal deficit present.     Mental Status: She is alert and oriented to person, place, and time.  Psychiatric:        Behavior: Behavior normal.        Judgment: Judgment normal.    LABORATORY DATA:  I have reviewed the data as listed Lab Results  Component Value Date   WBC 6.2 05/01/2022   HGB 12.0 05/01/2022   HCT 38.6 05/01/2022   MCV 97.2 05/01/2022   PLT 219 05/01/2022   Recent Labs    04/03/22 0814 04/17/22 0816 05/01/22 0841  NA 139 139 139  K 4.4 4.2 4.1  CL 106 105 105  CO2 '28 26 28  '$ GLUCOSE 92 91 106*  BUN '10 11 10  '$ CREATININE 0.88  0.92 0.97  CALCIUM 9.3 9.2 9.2  GFRNONAA >60 >60 >60  PROT 6.7 6.6 6.7  ALBUMIN 4.0 4.1 4.0  AST '26 29 26  '$ ALT '15 19 15  '$ ALKPHOS 67 75 76  BILITOT 0.5 0.4 0.4    RADIOGRAPHIC STUDIES: I have personally reviewed the radiological images as listed and agreed with the findings in the report. CT CHEST ABDOMEN PELVIS W CONTRAST  Result Date: 04/26/2022 CLINICAL DATA:  Patient with history of rectal carcinoma. On chemotherapy. * Tracking Code: BO * EXAM: CT CHEST, ABDOMEN, AND PELVIS WITH CONTRAST TECHNIQUE: Multidetector CT imaging of the chest, abdomen and pelvis was performed following the standard protocol during bolus administration of intravenous contrast. RADIATION DOSE REDUCTION: This exam was performed according to the departmental dose-optimization program which includes automated exposure control, adjustment of the mA and/or kV according to patient size and/or use of iterative reconstruction technique. CONTRAST:  154m OMNIPAQUE IOHEXOL 300 MG/ML  SOLN COMPARISON:  CT C AP January 20, 2022 FINDINGS: CT CHEST FINDINGS Cardiovascular: Right anterior chest wall Port-A-Cath is present with tip terminating in the superior vena cava. Normal heart size. Trace fluid superior pericardial recess. Thoracic aortic vascular calcifications. Mediastinum/Nodes: No enlarged axillary, mediastinal or hilar lymphadenopathy. Normal appearance of the esophagus. Lungs/Pleura: Central airways are patent. Dependent atelectasis within the lower lobes bilaterally. Stable 4 mm left upper lobe nodule (image 47; series 3). Stable 3 mm left upper lobe nodule (image 28; series 3). Biapical pleuroparenchymal thickening/scarring. Mild interval increase in size of 3 mm right upper lobe nodule (image 41; series 3), previously 2 mm. Interval increase in size of 8  mm right middle lobe nodule (image 104; series 3), previously 5 mm. Slight interval increase in size of 7 mm right middle lobe nodule (image 100; series 3), previously 5 mm. No  pleural effusion or pneumothorax. Musculoskeletal: No aggressive or acute appearing osseous lesions. CT ABDOMEN PELVIS FINDINGS Hepatobiliary: Liver is normal in size and contour. No focal hepatic lesion identified. Stable biliary ductal dilatation. Pancreas: Unremarkable Spleen: Unremarkable Adrenals/Urinary Tract: Normal adrenal glands. Kidneys enhance symmetrically with contrast. Bilateral renal cysts redemonstrated. No hydronephrosis. Urinary bladder is unremarkable. Stomach/Bowel: Patient is status post APR with left colostomy. No evidence for bowel obstruction. No free fluid or free intraperitoneal air. Normal morphology the stomach. Redemonstrated pelvic floor laxity and associated bulging of small bowel. Vascular/Lymphatic: Normal caliber abdominal aorta. Peripheral calcified atherosclerotic plaque. No retroperitoneal lymphadenopathy. Reproductive: Prior hysterectomy.  Pelvic masses. Other: None. Musculoskeletal: No aggressive or acute appearing osseous lesions. Lumbar spine degenerative changes. IMPRESSION: 1. Slight interval increase in size of a few of the scattered pulmonary nodules. Other nodules are stable. 2. Similar biliary ductal dilatation. 3. Prior APR with left colostomy, similar to prior. Electronically Signed   By: Lovey Newcomer M.D.   On: 04/26/2022 17:23     Rectal cancer ([place]) #Rectal carcinoma stage IV adenocarcinoma-patient currently on dose reduced FOLFIRI-Bev; CT scan JULY 12th, 2023- Slight interval increase in size of a few of the scattered pulmonary nodules. Other nodules are stable.    #Continued reduced FOLFIRI-[Irinotecan 100 mg/m2] / bevacizumab- Labs today reviewed;  acceptable for treatment today. UA-no proteinuria.  Patient tolerating chemotherapy fairly well except for mild diarrhea.   # pulmonary embolism: Continue  Eliquis 2.5 mg twice daily for anticoagulation prophylaxis.   # Hypokalemia:   potassium is stable.  Continue potassium chloride 20 mEq twice daily.     # Anemia due to antineoplastic chemotherapy:  patient will receive long-acting GCSF  Ziextenzo prophylaxis on D3   # Chemotherapy induced neutropenia: Patient will receive long-acting GCSF  Ziextenzo prophylaxis on D3   # DISPOSITION: # Chemo today; DO NOT WAIT for Urine protein; # keep appts as planned this week # follow up with Dr.Yu as planned-Dr.B  # I reviewed the blood work- with the patient in detail; also reviewed the imaging independently [as summarized above]; and with the patient in detail.         Above plan of care was discussed with patient/family in detail.  My contact information was given to the patient/family.       Cammie Sickle, MD 05/01/2022 9:16 PM

## 2022-05-01 NOTE — Assessment & Plan Note (Addendum)
#  Rectal carcinoma stage IV adenocarcinoma-patient currently on dose reduced FOLFIRI-Bev; CT scan JULY 12th, 2023- Slight interval increase in size of a few of the scattered pulmonary nodules. Other nodules are stable.   #Continued reduced FOLFIRI-[Irinotecan 100 mg/m2] / bevacizumab- Labs today reviewed;  acceptable for treatment today. UA-no proteinuria.  Patient tolerating chemotherapy fairly well except for mild diarrhea.  # pulmonary embolism: Continue  Eliquis 2.5 mg twice daily for anticoagulation prophylaxis.  # Hypokalemia:   potassium is stable.  Continue potassium chloride 20 mEq twice daily.   # Anemia due to antineoplastic chemotherapy:  patient will receive long-acting GCSF  Ziextenzo prophylaxis on D3  # Chemotherapy induced neutropenia: Patient will receive long-acting GCSF  Ziextenzo prophylaxis on D3  # DISPOSITION: # Chemo today; DO NOT WAIT for Urine protein; # keep appts as planned this week # follow up with Dr.Yu as planned-Dr.B  # I reviewed the blood work- with the patient in detail; also reviewed the imaging independently [as summarized above]; and with the patient in detail.

## 2022-05-01 NOTE — Patient Instructions (Signed)
[facility] CANCER CTR AT [place]  Discharge Instructions: Thank you for choosing [facility] to provide your oncology and hematology care.  If you have a lab appointment with the [facility], please go directly to the [facility] and check in at the registration area.  Wear comfortable clothing and clothing appropriate for easy access to any Portacath or PICC line.   We strive to give you quality time with your provider. You may need to reschedule your appointment if you arrive late (15 or more minutes).  Arriving late affects you and other patients whose appointments are after yours.  Also, if you miss three or more appointments without notifying the office, you may be dismissed from the clinic at the provider's discretion.      For prescription refill requests, have your pharmacy contact our office and allow 72 hours for refills to be completed.    Today you received the following chemotherapy and/or immunotherapy agents MVASI, Camptosar, Leucovorin, & Adrucil      To help prevent nausea and vomiting after your treatment, we encourage you to take your nausea medication as directed.  BELOW ARE SYMPTOMS THAT SHOULD BE REPORTED IMMEDIATELY: *FEVER GREATER THAN 100.4 F (38 C) OR HIGHER *CHILLS OR SWEATING *NAUSEA AND VOMITING THAT IS NOT CONTROLLED WITH YOUR NAUSEA MEDICATION *UNUSUAL SHORTNESS OF BREATH *UNUSUAL BRUISING OR BLEEDING *URINARY PROBLEMS (pain or burning when urinating, or frequent urination) *BOWEL PROBLEMS (unusual diarrhea, constipation, pain near the anus) TENDERNESS IN MOUTH AND THROAT WITH OR WITHOUT PRESENCE OF ULCERS (sore throat, sores in mouth, or a toothache) UNUSUAL RASH, SWELLING OR PAIN  UNUSUAL VAGINAL DISCHARGE OR ITCHING   Items with * indicate a potential emergency and should be followed up as soon as possible or go to the Emergency Department if any problems should occur.  Please show the CHEMOTHERAPY ALERT CARD or  IMMUNOTHERAPY ALERT CARD at check-in to the Emergency Department and triage nurse.  Should you have questions after your visit or need to cancel or reschedule your appointment, please contact [facility] CANCER [place] AT [place]  [phone] and follow the prompts.  Office hours are 8:00 a.m. to 4:30 p.m. Monday - Friday. Please note that voicemails left after 4:00 p.m. may not be returned until the following business day.  We are closed weekends and major holidays. You have access to a nurse at all times for urgent questions. Please call the main number to the clinic [phone] and follow the prompts.  For any non-urgent questions, you may also contact your provider using MyChart. We now offer e-Visits for anyone 75 and older to request care online for non-urgent symptoms. For details visit [email or web address].   Also download the MyChart app! Go to the app store, search "MyChart", open the app, select [facility], and log in with your MyChart username and password.  Masks are optional in the cancer centers. If you would like for your care team to wear a mask while they are taking care of you, please let them know. For doctor visits, patients may have with them one support person who is at least 75 years old. At this time, visitors are not allowed in the infusion area.

## 2022-05-02 LAB — CEA: CEA: 1350 ng/mL — ABNORMAL HIGH (ref 0.0–4.7)

## 2022-05-03 ENCOUNTER — Inpatient Hospital Stay: Payer: Medicare Other

## 2022-05-03 VITALS — BP 128/57 | HR 70 | Temp 96.9°F | Resp 18

## 2022-05-03 DIAGNOSIS — I2510 Atherosclerotic heart disease of native coronary artery without angina pectoris: Secondary | ICD-10-CM | POA: Diagnosis not present

## 2022-05-03 DIAGNOSIS — R634 Abnormal weight loss: Secondary | ICD-10-CM | POA: Diagnosis not present

## 2022-05-03 DIAGNOSIS — Z86711 Personal history of pulmonary embolism: Secondary | ICD-10-CM | POA: Diagnosis not present

## 2022-05-03 DIAGNOSIS — E876 Hypokalemia: Secondary | ICD-10-CM | POA: Diagnosis not present

## 2022-05-03 DIAGNOSIS — D702 Other drug-induced agranulocytosis: Secondary | ICD-10-CM | POA: Diagnosis not present

## 2022-05-03 DIAGNOSIS — R197 Diarrhea, unspecified: Secondary | ICD-10-CM | POA: Diagnosis not present

## 2022-05-03 DIAGNOSIS — Z79899 Other long term (current) drug therapy: Secondary | ICD-10-CM | POA: Diagnosis not present

## 2022-05-03 DIAGNOSIS — Z933 Colostomy status: Secondary | ICD-10-CM | POA: Diagnosis not present

## 2022-05-03 DIAGNOSIS — C20 Malignant neoplasm of rectum: Secondary | ICD-10-CM | POA: Diagnosis not present

## 2022-05-03 DIAGNOSIS — Z9221 Personal history of antineoplastic chemotherapy: Secondary | ICD-10-CM | POA: Diagnosis not present

## 2022-05-03 DIAGNOSIS — D6481 Anemia due to antineoplastic chemotherapy: Secondary | ICD-10-CM | POA: Diagnosis not present

## 2022-05-03 DIAGNOSIS — E7800 Pure hypercholesterolemia, unspecified: Secondary | ICD-10-CM | POA: Diagnosis not present

## 2022-05-03 DIAGNOSIS — K219 Gastro-esophageal reflux disease without esophagitis: Secondary | ICD-10-CM | POA: Diagnosis not present

## 2022-05-03 DIAGNOSIS — T451X5A Adverse effect of antineoplastic and immunosuppressive drugs, initial encounter: Secondary | ICD-10-CM | POA: Diagnosis not present

## 2022-05-03 DIAGNOSIS — K435 Parastomal hernia without obstruction or  gangrene: Secondary | ICD-10-CM | POA: Diagnosis not present

## 2022-05-03 DIAGNOSIS — C799 Secondary malignant neoplasm of unspecified site: Secondary | ICD-10-CM

## 2022-05-03 DIAGNOSIS — I10 Essential (primary) hypertension: Secondary | ICD-10-CM | POA: Diagnosis not present

## 2022-05-03 DIAGNOSIS — Z5111 Encounter for antineoplastic chemotherapy: Secondary | ICD-10-CM | POA: Diagnosis not present

## 2022-05-03 DIAGNOSIS — Z7901 Long term (current) use of anticoagulants: Secondary | ICD-10-CM | POA: Diagnosis not present

## 2022-05-03 DIAGNOSIS — Z86718 Personal history of other venous thrombosis and embolism: Secondary | ICD-10-CM | POA: Diagnosis not present

## 2022-05-03 MED ORDER — HEPARIN SOD (PORK) LOCK FLUSH 100 UNIT/ML IV SOLN
INTRAVENOUS | Status: AC
  Administered 2022-05-03: 500 [IU]
  Filled 2022-05-03: qty 5

## 2022-05-03 MED ORDER — PEGFILGRASTIM-BMEZ 6 MG/0.6ML ~~LOC~~ SOSY
6.0000 mg | PREFILLED_SYRINGE | Freq: Once | SUBCUTANEOUS | Status: AC
  Administered 2022-05-03: 6 mg via SUBCUTANEOUS
  Filled 2022-05-03: qty 0.6

## 2022-05-03 MED ORDER — HEPARIN SOD (PORK) LOCK FLUSH 100 UNIT/ML IV SOLN
500.0000 [IU] | Freq: Once | INTRAVENOUS | Status: AC | PRN
  Filled 2022-05-03: qty 5

## 2022-05-03 MED ORDER — SODIUM CHLORIDE 0.9% FLUSH
10.0000 mL | INTRAVENOUS | Status: DC | PRN
  Administered 2022-05-03: 10 mL
  Filled 2022-05-03: qty 10

## 2022-05-03 NOTE — Patient Instructions (Signed)
[facility] AT [place]  Discharge Instructions: Thank you for choosing [place] [place] to provide your oncology and hematology care.  If you have a lab appointment with the [facility], please go directly to the [facility] and check in at the registration area.  Wear comfortable clothing and clothing appropriate for easy access to any Portacath or PICC line.   We strive to give you quality time with your provider. You may need to reschedule your appointment if you arrive late (15 or more minutes).  Arriving late affects you and other patients whose appointments are after yours.  Also, if you miss three or more appointments without notifying the office, you may be dismissed from the clinic at the provider's discretion.      For prescription refill requests, have your pharmacy contact our office and allow 72 hours for refills to be completed.    Today you received the following chemotherapy and/or immunotherapy agents Ziextenzo      To help prevent nausea and vomiting after your treatment, we encourage you to take your nausea medication as directed.  BELOW ARE SYMPTOMS THAT SHOULD BE REPORTED IMMEDIATELY: *FEVER GREATER THAN 100.4 F (38 C) OR HIGHER *CHILLS OR SWEATING *NAUSEA AND VOMITING THAT IS NOT CONTROLLED WITH YOUR NAUSEA MEDICATION *UNUSUAL SHORTNESS OF BREATH *UNUSUAL BRUISING OR BLEEDING *URINARY PROBLEMS (pain or burning when urinating, or frequent urination) *BOWEL PROBLEMS (unusual diarrhea, constipation, pain near the anus) TENDERNESS IN MOUTH AND THROAT WITH OR WITHOUT PRESENCE OF ULCERS (sore throat, sores in mouth, or a toothache) UNUSUAL RASH, SWELLING OR PAIN  UNUSUAL VAGINAL DISCHARGE OR ITCHING   Items with * indicate a potential emergency and should be followed up as soon as possible or go to the Emergency Department if any problems should occur.  Please show the CHEMOTHERAPY ALERT CARD or IMMUNOTHERAPY ALERT CARD at check-in to  the Emergency Department and triage nurse.  Should you have questions after your visit or need to cancel or reschedule your appointment, please contact [facility] [place] [facility] [place]  [phone] and follow the prompts.  Office hours are 8:00 a.m. to 4:30 p.m. Monday - Friday. Please note that voicemails left after 4:00 p.m. may not be returned until the following business day.  We are closed weekends and major holidays. You have access to a nurse at all times for urgent questions. Please call the main number to the clinic [phone] and follow the prompts.  For any non-urgent questions, you may also contact your provider using MyChart. We now offer e-Visits for anyone 68 and older to request care online for non-urgent symptoms. For details visit [email or web address].   Also download the MyChart app! Go to the app store, search "MyChart", open the app, select [place], and log in with your MyChart username and password.  Masks are optional in the cancer centers. If you would like for your care team to wear a mask while they are taking care of you, please let them know. For doctor visits, patients may have with them one support person who is at least 75 years old. At this time, visitors are not allowed in the infusion area.

## 2022-05-12 MED FILL — Dexamethasone Sodium Phosphate Inj 100 MG/10ML: INTRAMUSCULAR | Qty: 1 | Status: AC

## 2022-05-15 ENCOUNTER — Inpatient Hospital Stay (HOSPITAL_BASED_OUTPATIENT_CLINIC_OR_DEPARTMENT_OTHER): Payer: Medicare Other | Admitting: Oncology

## 2022-05-15 ENCOUNTER — Other Ambulatory Visit: Payer: Medicare Other

## 2022-05-15 ENCOUNTER — Ambulatory Visit: Payer: Medicare Other | Admitting: Oncology

## 2022-05-15 ENCOUNTER — Encounter: Payer: Self-pay | Admitting: Oncology

## 2022-05-15 ENCOUNTER — Ambulatory Visit: Payer: Medicare Other

## 2022-05-15 ENCOUNTER — Inpatient Hospital Stay: Payer: Medicare Other

## 2022-05-15 VITALS — BP 136/55 | HR 66 | Temp 97.7°F | Resp 18 | Wt 160.9 lb

## 2022-05-15 DIAGNOSIS — D6481 Anemia due to antineoplastic chemotherapy: Secondary | ICD-10-CM | POA: Diagnosis not present

## 2022-05-15 DIAGNOSIS — E876 Hypokalemia: Secondary | ICD-10-CM

## 2022-05-15 DIAGNOSIS — C799 Secondary malignant neoplasm of unspecified site: Secondary | ICD-10-CM

## 2022-05-15 DIAGNOSIS — Z9221 Personal history of antineoplastic chemotherapy: Secondary | ICD-10-CM | POA: Diagnosis not present

## 2022-05-15 DIAGNOSIS — T451X5A Adverse effect of antineoplastic and immunosuppressive drugs, initial encounter: Secondary | ICD-10-CM

## 2022-05-15 DIAGNOSIS — R634 Abnormal weight loss: Secondary | ICD-10-CM | POA: Diagnosis not present

## 2022-05-15 DIAGNOSIS — D701 Agranulocytosis secondary to cancer chemotherapy: Secondary | ICD-10-CM

## 2022-05-15 DIAGNOSIS — Z7901 Long term (current) use of anticoagulants: Secondary | ICD-10-CM | POA: Diagnosis not present

## 2022-05-15 DIAGNOSIS — C20 Malignant neoplasm of rectum: Secondary | ICD-10-CM

## 2022-05-15 DIAGNOSIS — Z933 Colostomy status: Secondary | ICD-10-CM | POA: Diagnosis not present

## 2022-05-15 DIAGNOSIS — Z79899 Other long term (current) drug therapy: Secondary | ICD-10-CM | POA: Diagnosis not present

## 2022-05-15 DIAGNOSIS — I10 Essential (primary) hypertension: Secondary | ICD-10-CM | POA: Diagnosis not present

## 2022-05-15 DIAGNOSIS — Z86711 Personal history of pulmonary embolism: Secondary | ICD-10-CM | POA: Diagnosis not present

## 2022-05-15 DIAGNOSIS — K219 Gastro-esophageal reflux disease without esophagitis: Secondary | ICD-10-CM | POA: Diagnosis not present

## 2022-05-15 DIAGNOSIS — D702 Other drug-induced agranulocytosis: Secondary | ICD-10-CM | POA: Diagnosis not present

## 2022-05-15 DIAGNOSIS — R197 Diarrhea, unspecified: Secondary | ICD-10-CM | POA: Diagnosis not present

## 2022-05-15 DIAGNOSIS — Z86718 Personal history of other venous thrombosis and embolism: Secondary | ICD-10-CM | POA: Diagnosis not present

## 2022-05-15 DIAGNOSIS — E7800 Pure hypercholesterolemia, unspecified: Secondary | ICD-10-CM | POA: Diagnosis not present

## 2022-05-15 DIAGNOSIS — Z5111 Encounter for antineoplastic chemotherapy: Secondary | ICD-10-CM | POA: Diagnosis not present

## 2022-05-15 DIAGNOSIS — I2510 Atherosclerotic heart disease of native coronary artery without angina pectoris: Secondary | ICD-10-CM | POA: Diagnosis not present

## 2022-05-15 DIAGNOSIS — K435 Parastomal hernia without obstruction or  gangrene: Secondary | ICD-10-CM | POA: Diagnosis not present

## 2022-05-15 LAB — COMPREHENSIVE METABOLIC PANEL
ALT: 16 U/L (ref 0–44)
AST: 29 U/L (ref 15–41)
Albumin: 4.1 g/dL (ref 3.5–5.0)
Alkaline Phosphatase: 69 U/L (ref 38–126)
Anion gap: 8 (ref 5–15)
BUN: 9 mg/dL (ref 8–23)
CO2: 27 mmol/L (ref 22–32)
Calcium: 9.1 mg/dL (ref 8.9–10.3)
Chloride: 103 mmol/L (ref 98–111)
Creatinine, Ser: 0.77 mg/dL (ref 0.44–1.00)
GFR, Estimated: >60 mL/min (ref >60)
Glucose, Bld: 99 mg/dL (ref 70–99)
Potassium: 4.3 mmol/L (ref 3.5–5.1)
Sodium: 138 mmol/L (ref 135–145)
Total Bilirubin: 0.2 mg/dL — ABNORMAL LOW (ref 0.3–1.2)
Total Protein: 6.6 g/dL (ref 6.5–8.1)

## 2022-05-15 LAB — CBC WITH DIFFERENTIAL/PLATELET
Abs Immature Granulocytes: 0.06 10*3/uL (ref 0.00–0.07)
Basophils Absolute: 0 10*3/uL (ref 0.0–0.1)
Basophils Relative: 1 %
Eosinophils Absolute: 0.1 10*3/uL (ref 0.0–0.5)
Eosinophils Relative: 3 %
HCT: 37.4 % (ref 36.0–46.0)
Hemoglobin: 11.8 g/dL — ABNORMAL LOW (ref 12.0–15.0)
Immature Granulocytes: 1 %
Lymphocytes Relative: 28 %
Lymphs Abs: 1.4 10*3/uL (ref 0.7–4.0)
MCH: 30.7 pg (ref 26.0–34.0)
MCHC: 31.6 g/dL (ref 30.0–36.0)
MCV: 97.4 fL (ref 80.0–100.0)
Monocytes Absolute: 0.6 10*3/uL (ref 0.1–1.0)
Monocytes Relative: 11 %
Neutro Abs: 2.9 10*3/uL (ref 1.7–7.7)
Neutrophils Relative %: 56 %
Platelets: 200 10*3/uL (ref 150–400)
RBC: 3.84 MIL/uL — ABNORMAL LOW (ref 3.87–5.11)
RDW: 17.6 % — ABNORMAL HIGH (ref 11.5–15.5)
WBC: 5.1 10*3/uL (ref 4.0–10.5)
nRBC: 0 % (ref 0.0–0.2)

## 2022-05-15 LAB — PROTEIN, URINE, RANDOM: Total Protein, Urine: 12 mg/dL

## 2022-05-15 MED ORDER — SODIUM CHLORIDE 0.9 % IV SOLN
100.0000 mg/m2 | Freq: Once | INTRAVENOUS | Status: AC
  Administered 2022-05-15: 180 mg via INTRAVENOUS
  Filled 2022-05-15: qty 9

## 2022-05-15 MED ORDER — SODIUM CHLORIDE 0.9 % IV SOLN
350.0000 mg | Freq: Once | INTRAVENOUS | Status: AC
  Administered 2022-05-15: 350 mg via INTRAVENOUS
  Filled 2022-05-15: qty 14

## 2022-05-15 MED ORDER — ATROPINE SULFATE 1 MG/ML IV SOLN
0.4000 mg | Freq: Once | INTRAVENOUS | Status: AC
  Administered 2022-05-15: 0.4 mg via INTRAVENOUS
  Filled 2022-05-15: qty 1

## 2022-05-15 MED ORDER — SODIUM CHLORIDE 0.9 % IV SOLN
10.0000 mg | Freq: Once | INTRAVENOUS | Status: AC
  Administered 2022-05-15: 10 mg via INTRAVENOUS
  Filled 2022-05-15: qty 10

## 2022-05-15 MED ORDER — SODIUM CHLORIDE 0.9 % IV SOLN
Freq: Once | INTRAVENOUS | Status: AC
  Filled 2022-05-15: qty 250

## 2022-05-15 MED ORDER — SODIUM CHLORIDE 0.9% FLUSH
10.0000 mL | Freq: Once | INTRAVENOUS | Status: AC
  Administered 2022-05-15: 10 mL via INTRAVENOUS
  Filled 2022-05-15: qty 10

## 2022-05-15 MED ORDER — PALONOSETRON HCL INJECTION 0.25 MG/5ML
0.2500 mg | Freq: Once | INTRAVENOUS | Status: AC
  Administered 2022-05-15: 0.25 mg via INTRAVENOUS
  Filled 2022-05-15: qty 5

## 2022-05-15 MED ORDER — DIPHENHYDRAMINE HCL 50 MG/ML IJ SOLN
25.0000 mg | Freq: Every day | INTRAMUSCULAR | Status: DC | PRN
  Administered 2022-05-15: 25 mg via INTRAVENOUS
  Filled 2022-05-15: qty 1

## 2022-05-15 MED ORDER — SODIUM CHLORIDE 0.9 % IV SOLN
2400.0000 mg/m2 | INTRAVENOUS | Status: DC
  Administered 2022-05-15: 4300 mg via INTRAVENOUS
  Filled 2022-05-15: qty 86

## 2022-05-15 MED ORDER — SODIUM CHLORIDE 0.9 % IV SOLN
750.0000 mg | Freq: Once | INTRAVENOUS | Status: AC
  Administered 2022-05-15: 750 mg via INTRAVENOUS
  Filled 2022-05-15: qty 37.5

## 2022-05-15 NOTE — Assessment & Plan Note (Addendum)
potassium is stable.  Continue potassium chloride 40 mEq  daily.  

## 2022-05-15 NOTE — Progress Notes (Signed)
Hematology/Oncology Progress note [phone] [phone]      Patient Care Team: Jacqueline Hauser, DO as PCP - General (Family Medicine) Jacqueline Jacks, RN as Registered Nurse Jacqueline Server, MD as Consulting Physician (Hematology and Oncology) Jacqueline Sickle, MD as Consulting Physician (Oncology)  ASSESSMENT & PLAN:   Cancer Staging  Rectal cancer Jacqueline Yoder) Staging form: Colon and Rectum, AJCC 8th Edition - Pathologic stage from 10/06/2019: Stage IIC (ypT4b, pN0, cM0) - Signed by Jacqueline Server, MD on 10/06/2019 - Clinical stage from 06/30/2020: Jacqueline Yoder - Signed by Jacqueline Server, MD on 04/17/2022 - Pathologic: Stage Unknown (rpTX, pNX, cM1) - Signed by Jacqueline Server, MD on 01/31/2021   Rectal cancer Jacqueline Yoder) History of stage IIIC Rectal cancer, s/p TNT, followed by 09/17/19 APR/posterior vaginectomy/TAH/BSO/VY-flap, pT4b pN0 with close vaginal margin 0.2 mm.  Uterus and ovaries negative for malignancy. palliative radiation to vaginal recurrence- 01/19/21 recurrence with lung metastasis.-Palliative -FOLFIRI plus bevacizumab.  Irinotecan was dropped in November 2022 due to side effects. Negative for UGT1A1*28 - radiographically stable, rise of CEA-July 2023 CT showed slightly progression of lung metastasis. Recommend to continue current regimen. Labs are reviewed and discussed with patient. Proceed with dose reduced FOLFIRI-[Irinotecan 100 mg/m2] / bevacizumab-currently she tolerates well RAS wild type. Consider switch to irinotecan +panitumumab after progression .  Encounter for antineoplastic chemotherapy Chemotherapy plan as listed above.   Chemotherapy induced neutropenia (HCC)  patient will receive long-acting GCSF  Ziextenzo prophylaxis on D3  History of pulmonary embolism Continue  Eliquis 2.5 mg twice daily for anticoagulation prophylaxis.  Hypokalemia  potassium is stable.  Continue potassium chloride 40 mEq  daily.  Orders Placed This Encounter   Procedures   Protein, urine, random    Standing Status:   Standing    Number of Occurrences:   20    Standing Expiration Date:   05/16/2023     Follow up in 2 weeks. Lab covering MD next cycle of chemo All questions were answered. The patient knows to call the clinic with any problems, questions or concerns.  Jacqueline Server, MD, PhD Jacqueline Yoder Hematology Oncology 05/15/2022        CHIEF COMPLAINTS/REASON FOR VISIT:  Follow up for rectal cancer  HISTORY OF PRESENTING ILLNESS:  Patient initially presented with complaints of postmenopausal bleeding on 08/16/2018.  History of was menopausal vaginal bleeding in 2016 which resulted in cervical polypectomy.  Pathology 02/04/2015 showed cervical polyp, consistent with benign endometrial polyp.  [name] after polypectomy due to anxiety associated with pelvic exams.  pelvic exam on 08/16/2018 reviewed cervical abnormality and from enlarged uterus. Seen by Jacqueline Yoder on 10/29/2018.  Endometrial biopsy and a Pap smear was performed. 10/29/2018 Pap smear showed adenocarcinoma, favor endometrial origin. 10/29/2018 endometrial biopsy showed endometrioid carcinoma, FIGO grade 1.  10/29/2018- TA & TV Ultrasound revealed: Anteverted uterus measuring 8.7 x 5.6 x 6.4 cm without evidence of focal masses.  The endometrium measuring 24.1 mm (thickened) and heterogeneous.  Right and left ovaries not visualized.  No adnexal masses identified.  No free fluid in cul-de-sac.  Patient was seen by Jacqueline Yoder in clinic on 11/13/2018.  Cervical exam reveals 2 cm exophytic irregular mass consistent with malignancy.   11/19/2018 CT chest abdomen pelvis with contrast showed thickened endometrium with some irregularity compatible with the provided diagnosis of endometrial malignancy.  There is a mildly prominent left inguinal node 1.4 cm.  Patient was seen by Jacqueline Yoder on 11/20/2018 and left groin lymph node biopsy  was recommended.  11/26/2018 patient underwent left  inguinal lymph node biopsy. Pathology showed metastatic adenocarcinoma consistent with colorectal origin.  CDX 2+.  Case was discussed on tumor board.  Recommend colonoscopy for further evaluation.  Patient reports significant weight loss 30 pounds over the last year.  Chronic vaginal spotting. Change of bowel habits the past few months.  More constipated.  Family history positive for brother who has colon cancer prostate cancer.  patient has underwent colonoscopy on 12/03/2018 which reviewed a nonobstructing large mass in the rectum.  Also chronic fistula.  Mass was not circumferential.  This was biopsied with a cold forceps for histology.  Pathology came back hyperplastic polyp negative for dysplasia and malignancy. [name], patient underwent flex sigmoidoscopy on 12/06/2018 with rebiopsy of the rectal mass. This time biopsy results came back positive for invasive colorectal adenocarcinoma, moderately differentiated. Immunotherapy for nearly mismatch repair protein (MMR ) was performed.  There is no loss of MMR expression.  low probability of MSI high.   # Seen by Jacqueline Yoder surgery for evaluation of resectability for rectal cancer. In addition, she also had a second opinion with Jacqueline Yoder pathology where her endometrial biopsy pathology was changed to  adenocarcinoma, consistent with colorectal primary.   [name]. She has home Yoder that has been assisting with ostomy care  Patient was also evaluated by Jacqueline Yoder oncology.  Recommendation is to proceed with TNT with concurrent chemoradiation followed by neoadjuvant chemotherapy followed by surgical resection. Patient prefers to have treatment done locally with Jacqueline Yoder.   # Oncology Treatment:  02/03/2019- 03/19/2019  concurrent Xeloda and radiation.  Xeloda dose 874m /m2 BID - rounded to 16582mBID- on days of radiation. 04/09/2019, started on FOLFOX with bolus early.  Omitted.  07/16/2019 finished  8 cycles of FOLFOX. 09/17/19 APR/posterior vaginectomy/TAH/BSO/VY-flap pT4b pN0 with close vaginal margin 0.2 mm.  Uterus and ovaries negative for malignancy. Patient reports bilateral lower extremity numbness and tingling, intermittent, left worse than right. She has lost a lot of weight since her APR surgery.   #Family history with half brother having's history of colon cancer prostate cancer.  Personal history of colorectal cancer.  Patient has not decided if she wants genetic testing.    # history of PE( 01/13/2020)  in the bilateral lower extremity DVT (01/13/2020).   She finishes 6 months of anticoagulation with Eliquis 5 mg twice daily. Now switched to Eliquis 2.5 mg twice daily..  # She has now developed recurrent disease. #06/30/20  vaginal introitus mass biopsied. Pathology is consistent with metastatic colorectal adenocarcinoma I have discussed with Jacqueline Yoder surgery  Dr. MaHester Matesnd the mass is not resectable. Patient has also had colonoscopy by Dr. AnVicente Malesesterday. Normal examination. # 07/16/2020 cycle 1 FOLFIRI  # 07/20/2020 PET scan was done for further evaluation, images are consistent with local recurrence, no distant metastasis. #Discussed with radiation oncology Dr. ChBaruch Goutyill recommends concurrent chemotherapy and radiation. 08/02/2020-08/16/2020, patient starts radiation.  Xeloda was held due to neutropenia 08/17/2020,-09/06/2020 Xeloda 1500 mg twice daily concurrently with radiation  01/31/21 started on FOLFIRI + Bev 05/18/2021 CT chest abdomen pelvis showed Previously noted enlargement of bilateral inguinal lymph nodes is resolved, consistent with treatment response of nodal metastatic disease. Interval decrease in size of multiple small bilateral pulmonary nodules, consistent with treatment response of pulmonary metastatic disease. No evidence of new metastatic disease. 05/24/2021 - 08/30/2021, continued on FOLFIRI plus bevacizumab.  Irinotecan dose was reduced, eventually  10057m2  09/02/2021,  CT chest abdomen pelvis without contrast Showed small bilateral pulmonary nodules, unchanged.  Stable metastatic disease.  No noncontrast evidence of new metastatic disease in the chest abdomen pelvis.  Small parastomal hernia.  Enlargement of main pulmonary artery.  Coronary artery disease.  09/13/2021, maintenance 5-FU/bevacizumab 11/28/2021, 5-FU/Irinotecan/bevacizumab.  Irinotecan 100 mg/m2 was added back due to progressively increasing CEA.  INTERVAL HISTORY Jacqueline Yoder is a 76 y.o. female who has above history reviewed by me presents for follow-up of rectal cancer. Currently on dose reduced FOLFIRI/bevacizumab  overall she tolerates well.  Chronic diarrhea is manageable and stable symptoms. Improved with antidiarrhea PRN.  She was accompanied by her daughter today.  Patient has no new complaints Appetite is fair.  Weight is stable.   Review of Systems  Constitutional:  Positive for fatigue. Negative for appetite change, chills, fever and unexpected weight change.  HENT:   Negative for hearing loss and voice change.   Eyes:  Negative for eye problems.  Respiratory:  Negative for chest tightness and cough.   Cardiovascular:  Negative for chest pain.  Gastrointestinal:  Negative for abdominal distention, abdominal pain, blood in stool, constipation, diarrhea and nausea.  Endocrine: Negative for hot flashes.  Genitourinary:  Negative for difficulty urinating and frequency.   Musculoskeletal:  Negative for arthralgias.  Skin:  Negative for itching and rash.  Neurological:  Negative for extremity weakness and numbness.  Hematological:  Negative for adenopathy.  Psychiatric/Behavioral:  Negative for confusion.     MEDICAL HISTORY:  Past Medical History:  Diagnosis Date   Allergy    Arthritis    Blood clot in vein    Family history of colon cancer    GERD (gastroesophageal reflux disease)    Hypercholesteremia    Hypertension    Hypertension    Lower  extremity edema    Personal history of chemotherapy    Rectal cancer ([place]) 12/2018   Urinary incontinence     SURGICAL HISTORY: Past Surgical History:  Procedure Laterality Date   ABDOMINAL HYSTERECTOMY     CHOLECYSTECTOMY  1971   COLONOSCOPY WITH PROPOFOL N/A 12/03/2018   Procedure: COLONOSCOPY WITH PROPOFOL;  Surgeon: Lucilla Lame, MD;  Location: [facility] ENDOSCOPY;  Service: Endoscopy;  Laterality: N/A;   COLONOSCOPY WITH PROPOFOL N/A 07/15/2020   Procedure: COLONOSCOPY WITH PROPOFOL;  Surgeon: [name], MD;  Location: [facility] ENDOSCOPY;  Service: Gastroenterology;  Laterality: N/A;   FLEXIBLE SIGMOIDOSCOPY N/A 12/06/2018   Procedure: FLEXIBLE SIGMOIDOSCOPY;  Surgeon: [name], MD;  Location: [facility] ENDOSCOPY;  Service: Endoscopy;  Laterality: N/A;   LAPAROSCOPIC COLOSTOMY  01/06/2019   PORTACATH PLACEMENT N/A 04/03/2019   Procedure: INSERTION PORT-A-CATH;  Surgeon: Jules Husbands, MD;  Location: [facility] ORS;  Service: General;  Laterality: N/A;    SOCIAL HISTORY: Social History   Socioeconomic History   Marital status: Widowed    Spouse name: Not on file   Number of children: Not on file   Years of education: Not on file   Highest education level: Not on file  Occupational History   Not on file  Tobacco Use   Smoking status: Former    Types: Cigarettes    Quit date: 12/02/1977    Years since quitting: 44.4   Smokeless tobacco: Former  Scientific laboratory technician Use: Never used  Substance and Sexual Activity   Alcohol use: Never   Drug use: Never   Sexual activity: Not Currently    Birth control/protection: None  Other Topics Concern   Not  on file  Social History Narrative   Lives with daughter   Social Determinants of Yoder   Financial Resource Strain: Low Risk  (06/28/2021)   Overall Financial Resource Strain (CARDIA)    Difficulty of Paying Living Expenses: Not hard at all  Food Insecurity: No Food Insecurity (06/28/2021)   Hunger Vital Sign    Worried About Running Out  of Food in the Last Year: Never true    Ran Out of Food in the Last Year: Never true  Transportation Needs: No Transportation Needs (06/28/2021)   PRAPARE - Hydrologist (Medical): No    Lack of Transportation (Non-Medical): No  Physical Activity: Inactive (06/28/2021)   Exercise Vital Sign    Days of Exercise per Week: 0 days    Minutes of Exercise per Session: 0 min  Stress: No Stress Concern Present (06/28/2021)   [place]    Feeling of Stress : Not at all  Social Connections: Not on file  Intimate Partner Violence: Not on file    FAMILY HISTORY: Family History  Problem Relation Age of Onset   Colon cancer Brother 72       exposure to chemicals [place]   Hypertension Mother    Stroke Mother    Kidney failure Father    Breast cancer Neg Hx    Ovarian cancer Neg Hx     ALLERGIES:  is allergic to sulfamethoxazole-trimethoprim.  MEDICATIONS:  Current Outpatient Medications  Medication Sig Dispense Refill   Cholecalciferol (VITAMIN D3) 2000 units capsule Take 2,000 Units by mouth daily.     cholestyramine (QUESTRAN) 4 g packet Take 1 packet (4 g total) by mouth 3 (three) times daily. 90 each 1   diclofenac sodium (VOLTAREN) 1 % GEL Apply 2 g topically 4 (four) times daily as needed (joint pain).  11   diphenoxylate-atropine (LOMOTIL) 2.5-0.025 MG tablet Take 1 tablet by mouth 4 (four) times daily as needed for diarrhea or loose stools. 30 tablet 1   ELIQUIS 2.5 MG TABS tablet TAKE 1 TABLET BY MOUTH TWICE  DAILY 60 tablet 11   fluticasone (FLONASE) 50 MCG/ACT nasal spray USE 1 SPRAY IN EACH NOSTRIL ONCE DAILY 16 g 3   gabapentin (NEURONTIN) 100 MG capsule Take 1 capsule (100 mg total) by mouth at bedtime. 30 capsule 0   ipratropium (ATROVENT) 0.03 % nasal spray Place 1 spray into both nostrils 2 (two) times daily. 30 mL 2   lidocaine-prilocaine (EMLA) cream Apply 1 application.  topically as needed. 30 g 6   loperamide (IMODIUM) 2 MG capsule Take 1 capsule (2 mg total) by mouth See admin instructions. With onset of loose stool, take 19m followed by 228mevery 2 hours,  Maximum: 16 mg/day 120 capsule 1   loratadine (CLARITIN) 10 MG tablet Take 10 mg by mouth daily.     Multiple Vitamins-Minerals (ONE-A-DAY WOMENS 50 PLUS PO) Take 1 tablet by mouth daily.      ondansetron (ZOFRAN) 8 MG tablet Take 1 tablet (8 mg total) by mouth 2 (two) times daily as needed for refractory nausea / vomiting. Start on day 3 after chemotherapy. 30 tablet 1   potassium chloride SA (KLOR-CON M) 20 MEQ tablet Take 2 tablets (40 mEq total) by mouth daily. 60 tablet 5   prochlorperazine (COMPAZINE) 10 MG tablet Take 1 tablet (10 mg total) by mouth every 6 (six) hours as needed (NAUSEA). 30 tablet 1   simvastatin (ZOCOR)  40 MG tablet TAKE 1 TABLET BY MOUTH AT  BEDTIME 90 tablet 1   triamterene-hydrochlorothiazide (DYAZIDE) 37.5-25 MG capsule TAKE 1 CAPSULE BY MOUTH  DAILY 90 capsule 1   zinc gluconate 50 MG tablet Take 50 mg by mouth daily.     No current facility-administered medications for this visit.   Facility-Administered Medications Ordered in Other Visits  Medication Dose Route Frequency Provider Last Rate Last Admin   fluorouracil (ADRUCIL) 4,300 mg in sodium chloride 0.9 % 64 mL chemo infusion  2,400 mg/m2 (Order-Specific) Intravenous 1 day or 1 dose Jacqueline Server, MD       irinotecan (CAMPTOSAR) 180 mg in sodium chloride 0.9 % 500 mL chemo infusion  100 mg/m2 (Order-Specific) Intravenous Once Jacqueline Server, MD 339 mL/hr at 05/15/22 1147 180 mg at 05/15/22 1147   leucovorin 750 mg in sodium chloride 0.9 % 250 mL infusion  750 mg Intravenous Once Jacqueline Server, MD 192 mL/hr at 05/15/22 1145 750 mg at 05/15/22 1145   sodium chloride flush (NS) 0.9 % injection 10 mL  10 mL Intracatheter PRN Jacqueline Server, MD   10 mL at 03/22/22 1258     PHYSICAL EXAMINATION: ECOG PERFORMANCE STATUS: 1 - Symptomatic but  completely ambulatory  Physical Exam Constitutional:      General: She is not in acute distress. HENT:     Head: Normocephalic and atraumatic.  Eyes:     General: No scleral icterus. Cardiovascular:     Rate and Rhythm: Normal rate and regular rhythm.     Heart sounds: Normal heart sounds.  Pulmonary:     Effort: Pulmonary effort is normal. No respiratory distress.     Breath sounds: No wheezing.  Abdominal:     General: Bowel sounds are normal. There is no distension.     Palpations: Abdomen is soft.     Comments: + Colostomy bag   Musculoskeletal:        General: No deformity. Normal range of motion.     Cervical back: Normal range of motion and neck supple.     Comments: Right lower extremity subcutaneous cystic nodule, mobile, non tender  Skin:    General: Skin is warm and dry.     Findings: No erythema or rash.  Neurological:     Mental Status: She is alert and oriented to person, place, and time. Mental status is at baseline.     Cranial Nerves: No cranial nerve deficit.     Coordination: Coordination normal.       LABORATORY DATA:  I have reviewed the data as listed Lab Results  Component Value Date   WBC 5.1 05/15/2022   HGB 11.8 (L) 05/15/2022   HCT 37.4 05/15/2022   MCV 97.4 05/15/2022   PLT 200 05/15/2022   Recent Labs    04/17/22 0816 05/01/22 0841 05/15/22 0847  NA 139 139 138  K 4.2 4.1 4.3  CL 105 105 103  CO2 _0 GLUCOSE 91 106* 99  BUN _1 CREATININE 0.92 0.97 0.77  CALCIUM 9.2 9.2 9.1  GFRNONAA >60 >60 >60  PROT 6.6 6.7 6.6  ALBUMIN 4.1 4.0 4.1  AST _2 ALT _3 ALKPHOS 75 76 69  BILITOT 0.4 0.4 0.2*    Iron/TIBC/Ferritin/ %Sat    Component Value Date/Time   IRON 40 06/23/2020 1124   TIBC 246 (L) 06/23/2020 1124   FERRITIN 338 (H) 06/23/2020 1124   IRONPCTSAT 16 06/23/2020 1124  RADIOGRAPHIC STUDIES: I have personally reviewed the radiological images as listed and agreed with the findings in the  report. CT CHEST ABDOMEN PELVIS W CONTRAST  Result Date: 04/26/2022 CLINICAL DATA:  Patient with history of rectal carcinoma. On chemotherapy. * Tracking Code: BO * EXAM: CT CHEST, ABDOMEN, AND PELVIS WITH CONTRAST TECHNIQUE: Multidetector CT imaging of the chest, abdomen and pelvis was performed following the standard protocol during bolus administration of intravenous contrast. RADIATION DOSE REDUCTION: This exam was performed according to the departmental dose-optimization program which includes automated exposure control, adjustment of the mA and/or kV according to patient size and/or use of iterative reconstruction technique. CONTRAST:  128m OMNIPAQUE IOHEXOL 300 MG/ML  SOLN COMPARISON:  CT C AP January 20, 2022 FINDINGS: CT CHEST FINDINGS Cardiovascular: Right anterior chest wall Port-A-Cath is present with tip terminating in the superior vena cava. Normal heart size. Trace fluid superior pericardial recess. Thoracic aortic vascular calcifications. Mediastinum/Nodes: No enlarged axillary, mediastinal or hilar lymphadenopathy. Normal appearance of the esophagus. Lungs/Pleura: Central airways are patent. Dependent atelectasis within the lower lobes bilaterally. Stable 4 mm left upper lobe nodule (image 47; series 3). Stable 3 mm left upper lobe nodule (image 28; series 3). Biapical pleuroparenchymal thickening/scarring. Mild interval increase in size of 3 mm right upper lobe nodule (image 41; series 3), previously 2 mm. Interval increase in size of 8 mm right middle lobe nodule (image 104; series 3), previously 5 mm. Slight interval increase in size of 7 mm right middle lobe nodule (image 100; series 3), previously 5 mm. No pleural effusion or pneumothorax. Musculoskeletal: No aggressive or acute appearing osseous lesions. CT ABDOMEN PELVIS FINDINGS Hepatobiliary: Liver is normal in size and contour. No focal hepatic lesion identified. Stable biliary ductal dilatation. Pancreas: Unremarkable Spleen:  Unremarkable Adrenals/Urinary Tract: Normal adrenal glands. Kidneys enhance symmetrically with contrast. Bilateral renal cysts redemonstrated. No hydronephrosis. Urinary bladder is unremarkable. Stomach/Bowel: Patient is status post APR with left colostomy. No evidence for bowel obstruction. No free fluid or free intraperitoneal air. Normal morphology the stomach. Redemonstrated pelvic floor laxity and associated bulging of small bowel. Vascular/Lymphatic: Normal caliber abdominal aorta. Peripheral calcified atherosclerotic plaque. No retroperitoneal lymphadenopathy. Reproductive: Prior hysterectomy.  Pelvic masses. Other: None. Musculoskeletal: No aggressive or acute appearing osseous lesions. Lumbar spine degenerative changes. IMPRESSION: 1. Slight interval increase in size of a few of the scattered pulmonary nodules. Other nodules are stable. 2. Similar biliary ductal dilatation. 3. Prior APR with left colostomy, similar to prior. Electronically Signed   By: DLovey NewcomerM.D.   On: 04/26/2022 17:23      CT CHEST ABDOMEN PELVIS W CONTRAST  Result Date: 04/26/2022 CLINICAL DATA:  Patient with history of rectal carcinoma. On chemotherapy. * Tracking Code: BO * EXAM: CT CHEST, ABDOMEN, AND PELVIS WITH CONTRAST TECHNIQUE: Multidetector CT imaging of the chest, abdomen and pelvis was performed following the standard protocol during bolus administration of intravenous contrast. RADIATION DOSE REDUCTION: This exam was performed according to the departmental dose-optimization program which includes automated exposure control, adjustment of the mA and/or kV according to patient size and/or use of iterative reconstruction technique. CONTRAST:  1021mOMNIPAQUE IOHEXOL 300 MG/ML  SOLN COMPARISON:  CT C AP January 20, 2022 FINDINGS: CT CHEST FINDINGS Cardiovascular: Right anterior chest wall Port-A-Cath is present with tip terminating in the superior vena cava. Normal heart size. Trace fluid superior pericardial recess.  Thoracic aortic vascular calcifications. Mediastinum/Nodes: No enlarged axillary, mediastinal or hilar lymphadenopathy. Normal appearance of the esophagus. Lungs/Pleura: Central airways are  patent. Dependent atelectasis within the lower lobes bilaterally. Stable 4 mm left upper lobe nodule (image 47; series 3). Stable 3 mm left upper lobe nodule (image 28; series 3). Biapical pleuroparenchymal thickening/scarring. Mild interval increase in size of 3 mm right upper lobe nodule (image 41; series 3), previously 2 mm. Interval increase in size of 8 mm right middle lobe nodule (image 104; series 3), previously 5 mm. Slight interval increase in size of 7 mm right middle lobe nodule (image 100; series 3), previously 5 mm. No pleural effusion or pneumothorax. Musculoskeletal: No aggressive or acute appearing osseous lesions. CT ABDOMEN PELVIS FINDINGS Hepatobiliary: Liver is normal in size and contour. No focal hepatic lesion identified. Stable biliary ductal dilatation. Pancreas: Unremarkable Spleen: Unremarkable Adrenals/Urinary Tract: Normal adrenal glands. Kidneys enhance symmetrically with contrast. Bilateral renal cysts redemonstrated. No hydronephrosis. Urinary bladder is unremarkable. Stomach/Bowel: Patient is status post APR with left colostomy. No evidence for bowel obstruction. No free fluid or free intraperitoneal air. Normal morphology the stomach. Redemonstrated pelvic floor laxity and associated bulging of small bowel. Vascular/Lymphatic: Normal caliber abdominal aorta. Peripheral calcified atherosclerotic plaque. No retroperitoneal lymphadenopathy. Reproductive: Prior hysterectomy.  Pelvic masses. Other: None. Musculoskeletal: No aggressive or acute appearing osseous lesions. Lumbar spine degenerative changes. IMPRESSION: 1. Slight interval increase in size of a few of the scattered pulmonary nodules. Other nodules are stable. 2. Similar biliary ductal dilatation. 3. Prior APR with left colostomy, similar to  prior. Electronically Signed   By: Lovey Newcomer M.D.   On: 04/26/2022 17:23

## 2022-05-15 NOTE — Assessment & Plan Note (Signed)
Chemotherapy plan as listed above 

## 2022-05-15 NOTE — Assessment & Plan Note (Addendum)
History of stage IIIC Rectal cancer, s/p TNT, followed by 09/17/19 APR/posterior vaginectomy/TAH/BSO/VY-flap, pT4b pN0 with close vaginal margin 0.2 mm.  Uterus and ovaries negative for malignancy. palliative radiation to vaginal recurrence- 01/19/21 recurrence with lung metastasis.-Palliative -FOLFIRI plus bevacizumab.  Irinotecan was dropped in November 2022 due to side effects. Negative for UGT1A1*28 - radiographically stable, rise of CEA-July 2023 CT showed slightly progression of lung metastasis. Recommend to continue current regimen. Labs are reviewed and discussed with patient. Proceed with dose reduced FOLFIRI-[Irinotecan 100 mg/m2] / bevacizumab-currently she tolerates well RAS wild type. Consider switch to irinotecan +panitumumab after progression .

## 2022-05-15 NOTE — Patient Instructions (Signed)
[facility] CANCER CTR AT [place]  Discharge Instructions: Thank you for choosing [facility] to provide your oncology and hematology care.  If you have a lab appointment with the [facility], please go directly to the [facility] and check in at the registration area.  Wear comfortable clothing and clothing appropriate for easy access to any Portacath or PICC line.   We strive to give you quality time with your provider. You may need to reschedule your appointment if you arrive late (15 or more minutes).  Arriving late affects you and other patients whose appointments are after yours.  Also, if you miss three or more appointments without notifying the office, you may be dismissed from the clinic at the provider's discretion.      For prescription refill requests, have your pharmacy contact our office and allow 72 hours for refills to be completed.    Today you received the following chemotherapy and/or immunotherapy agents Mvasi, Irinotecan, Leucovorin        To help prevent nausea and vomiting after your treatment, we encourage you to take your nausea medication as directed.  BELOW ARE SYMPTOMS THAT SHOULD BE REPORTED IMMEDIATELY: *FEVER GREATER THAN 100.4 F (38 C) OR HIGHER *CHILLS OR SWEATING *NAUSEA AND VOMITING THAT IS NOT CONTROLLED WITH YOUR NAUSEA MEDICATION *UNUSUAL SHORTNESS OF BREATH *UNUSUAL BRUISING OR BLEEDING *URINARY PROBLEMS (pain or burning when urinating, or frequent urination) *BOWEL PROBLEMS (unusual diarrhea, constipation, pain near the anus) TENDERNESS IN MOUTH AND THROAT WITH OR WITHOUT PRESENCE OF ULCERS (sore throat, sores in mouth, or a toothache) UNUSUAL RASH, SWELLING OR PAIN  UNUSUAL VAGINAL DISCHARGE OR ITCHING   Items with * indicate a potential emergency and should be followed up as soon as possible or go to the Emergency Department if any problems should occur.  Please show the CHEMOTHERAPY ALERT CARD or IMMUNOTHERAPY  ALERT CARD at check-in to the Emergency Department and triage nurse.  Should you have questions after your visit or need to cancel or reschedule your appointment, please contact [facility] CANCER [place] AT [place]  [phone] and follow the prompts.  Office hours are 8:00 a.m. to 4:30 p.m. Monday - Friday. Please note that voicemails left after 4:00 p.m. may not be returned until the following business day.  We are closed weekends and major holidays. You have access to a nurse at all times for urgent questions. Please call the main number to the clinic [phone] and follow the prompts.  For any non-urgent questions, you may also contact your provider using MyChart. We now offer e-Visits for anyone 70 and older to request care online for non-urgent symptoms. For details visit [email or web address].   Also download the MyChart app! Go to the app store, search "MyChart", open the app, select [facility], and log in with your MyChart username and password.  Masks are optional in the cancer centers. If you would like for your care team to wear a mask while they are taking care of you, please let them know. For doctor visits, patients may have with them one support person who is at least 75 years old. At this time, visitors are not allowed in the infusion area.

## 2022-05-15 NOTE — Assessment & Plan Note (Signed)
patient will receive long-acting GCSF   Ziextenzo prophylaxis on D3 

## 2022-05-15 NOTE — Progress Notes (Signed)
Pt noted to have hives on upper chest/rt shoulder. This has been noted to occur each treatment. Pt given PRN Benadryl as ordered. Pt reports daughter is driving her home and denies any other s/s or concerns at this time.  Pt stable at discharge.

## 2022-05-15 NOTE — Assessment & Plan Note (Signed)
Continue  Eliquis 2.5 mg twice daily for anticoagulation prophylaxis. 

## 2022-05-16 LAB — CEA: CEA: 1377 ng/mL — ABNORMAL HIGH (ref 0.0–4.7)

## 2022-05-17 ENCOUNTER — Inpatient Hospital Stay: Payer: Medicare Other | Attending: Oncology

## 2022-05-17 VITALS — BP 134/54 | HR 67 | Temp 98.3°F | Resp 18

## 2022-05-17 DIAGNOSIS — Z5111 Encounter for antineoplastic chemotherapy: Secondary | ICD-10-CM | POA: Insufficient documentation

## 2022-05-17 DIAGNOSIS — K219 Gastro-esophageal reflux disease without esophagitis: Secondary | ICD-10-CM | POA: Diagnosis not present

## 2022-05-17 DIAGNOSIS — R634 Abnormal weight loss: Secondary | ICD-10-CM | POA: Diagnosis not present

## 2022-05-17 DIAGNOSIS — Z79899 Other long term (current) drug therapy: Secondary | ICD-10-CM | POA: Diagnosis not present

## 2022-05-17 DIAGNOSIS — Z86711 Personal history of pulmonary embolism: Secondary | ICD-10-CM | POA: Insufficient documentation

## 2022-05-17 DIAGNOSIS — Z800 Family history of malignant neoplasm of digestive organs: Secondary | ICD-10-CM | POA: Insufficient documentation

## 2022-05-17 DIAGNOSIS — Z87891 Personal history of nicotine dependence: Secondary | ICD-10-CM | POA: Diagnosis not present

## 2022-05-17 DIAGNOSIS — K838 Other specified diseases of biliary tract: Secondary | ICD-10-CM | POA: Diagnosis not present

## 2022-05-17 DIAGNOSIS — I10 Essential (primary) hypertension: Secondary | ICD-10-CM | POA: Insufficient documentation

## 2022-05-17 DIAGNOSIS — Z933 Colostomy status: Secondary | ICD-10-CM | POA: Insufficient documentation

## 2022-05-17 DIAGNOSIS — D701 Agranulocytosis secondary to cancer chemotherapy: Secondary | ICD-10-CM | POA: Diagnosis not present

## 2022-05-17 DIAGNOSIS — C799 Secondary malignant neoplasm of unspecified site: Secondary | ICD-10-CM

## 2022-05-17 DIAGNOSIS — T451X5A Adverse effect of antineoplastic and immunosuppressive drugs, initial encounter: Secondary | ICD-10-CM | POA: Diagnosis not present

## 2022-05-17 DIAGNOSIS — E7800 Pure hypercholesterolemia, unspecified: Secondary | ICD-10-CM | POA: Insufficient documentation

## 2022-05-17 DIAGNOSIS — C20 Malignant neoplasm of rectum: Secondary | ICD-10-CM | POA: Diagnosis not present

## 2022-05-17 DIAGNOSIS — Z5189 Encounter for other specified aftercare: Secondary | ICD-10-CM | POA: Diagnosis not present

## 2022-05-17 DIAGNOSIS — M47816 Spondylosis without myelopathy or radiculopathy, lumbar region: Secondary | ICD-10-CM | POA: Insufficient documentation

## 2022-05-17 DIAGNOSIS — D6481 Anemia due to antineoplastic chemotherapy: Secondary | ICD-10-CM | POA: Diagnosis not present

## 2022-05-17 DIAGNOSIS — C7800 Secondary malignant neoplasm of unspecified lung: Secondary | ICD-10-CM | POA: Insufficient documentation

## 2022-05-17 MED ORDER — SODIUM CHLORIDE 0.9% FLUSH
10.0000 mL | INTRAVENOUS | Status: DC | PRN
  Administered 2022-05-17: 10 mL
  Filled 2022-05-17: qty 10

## 2022-05-17 MED ORDER — PEGFILGRASTIM-BMEZ 6 MG/0.6ML ~~LOC~~ SOSY
6.0000 mg | PREFILLED_SYRINGE | Freq: Once | SUBCUTANEOUS | Status: AC
  Administered 2022-05-17: 6 mg via SUBCUTANEOUS
  Filled 2022-05-17: qty 0.6

## 2022-05-17 MED ORDER — HEPARIN SOD (PORK) LOCK FLUSH 100 UNIT/ML IV SOLN
500.0000 [IU] | Freq: Once | INTRAVENOUS | Status: AC | PRN
  Administered 2022-05-17: 500 [IU]
  Filled 2022-05-17: qty 5

## 2022-05-26 MED FILL — Dexamethasone Sodium Phosphate Inj 100 MG/10ML: INTRAMUSCULAR | Qty: 1 | Status: AC

## 2022-05-29 ENCOUNTER — Inpatient Hospital Stay: Payer: Medicare Other

## 2022-05-29 ENCOUNTER — Inpatient Hospital Stay (HOSPITAL_BASED_OUTPATIENT_CLINIC_OR_DEPARTMENT_OTHER): Payer: Medicare Other | Admitting: Oncology

## 2022-05-29 ENCOUNTER — Encounter: Payer: Self-pay | Admitting: Oncology

## 2022-05-29 VITALS — BP 131/58 | HR 64 | Temp 97.4°F | Wt 158.0 lb

## 2022-05-29 DIAGNOSIS — Z86711 Personal history of pulmonary embolism: Secondary | ICD-10-CM

## 2022-05-29 DIAGNOSIS — M47816 Spondylosis without myelopathy or radiculopathy, lumbar region: Secondary | ICD-10-CM | POA: Diagnosis not present

## 2022-05-29 DIAGNOSIS — E876 Hypokalemia: Secondary | ICD-10-CM | POA: Diagnosis not present

## 2022-05-29 DIAGNOSIS — K219 Gastro-esophageal reflux disease without esophagitis: Secondary | ICD-10-CM | POA: Diagnosis not present

## 2022-05-29 DIAGNOSIS — T451X5A Adverse effect of antineoplastic and immunosuppressive drugs, initial encounter: Secondary | ICD-10-CM

## 2022-05-29 DIAGNOSIS — Z5111 Encounter for antineoplastic chemotherapy: Secondary | ICD-10-CM | POA: Diagnosis not present

## 2022-05-29 DIAGNOSIS — C20 Malignant neoplasm of rectum: Secondary | ICD-10-CM

## 2022-05-29 DIAGNOSIS — D6481 Anemia due to antineoplastic chemotherapy: Secondary | ICD-10-CM

## 2022-05-29 DIAGNOSIS — D701 Agranulocytosis secondary to cancer chemotherapy: Secondary | ICD-10-CM

## 2022-05-29 DIAGNOSIS — Z933 Colostomy status: Secondary | ICD-10-CM | POA: Diagnosis not present

## 2022-05-29 DIAGNOSIS — E7800 Pure hypercholesterolemia, unspecified: Secondary | ICD-10-CM | POA: Diagnosis not present

## 2022-05-29 DIAGNOSIS — C799 Secondary malignant neoplasm of unspecified site: Secondary | ICD-10-CM | POA: Diagnosis not present

## 2022-05-29 DIAGNOSIS — Z79899 Other long term (current) drug therapy: Secondary | ICD-10-CM | POA: Diagnosis not present

## 2022-05-29 DIAGNOSIS — Z87891 Personal history of nicotine dependence: Secondary | ICD-10-CM | POA: Diagnosis not present

## 2022-05-29 DIAGNOSIS — Z5189 Encounter for other specified aftercare: Secondary | ICD-10-CM | POA: Diagnosis not present

## 2022-05-29 DIAGNOSIS — C7800 Secondary malignant neoplasm of unspecified lung: Secondary | ICD-10-CM | POA: Diagnosis not present

## 2022-05-29 DIAGNOSIS — K838 Other specified diseases of biliary tract: Secondary | ICD-10-CM | POA: Diagnosis not present

## 2022-05-29 DIAGNOSIS — R634 Abnormal weight loss: Secondary | ICD-10-CM | POA: Diagnosis not present

## 2022-05-29 DIAGNOSIS — I10 Essential (primary) hypertension: Secondary | ICD-10-CM | POA: Diagnosis not present

## 2022-05-29 DIAGNOSIS — Z800 Family history of malignant neoplasm of digestive organs: Secondary | ICD-10-CM | POA: Diagnosis not present

## 2022-05-29 LAB — CBC WITH DIFFERENTIAL/PLATELET
Abs Immature Granulocytes: 0.07 10*3/uL (ref 0.00–0.07)
Basophils Absolute: 0 10*3/uL (ref 0.0–0.1)
Basophils Relative: 1 %
Eosinophils Absolute: 0.2 10*3/uL (ref 0.0–0.5)
Eosinophils Relative: 3 %
HCT: 38.5 % (ref 36.0–46.0)
Hemoglobin: 12.1 g/dL (ref 12.0–15.0)
Immature Granulocytes: 1 %
Lymphocytes Relative: 21 %
Lymphs Abs: 1.4 10*3/uL (ref 0.7–4.0)
MCH: 30.3 pg (ref 26.0–34.0)
MCHC: 31.4 g/dL (ref 30.0–36.0)
MCV: 96.3 fL (ref 80.0–100.0)
Monocytes Absolute: 0.7 10*3/uL (ref 0.1–1.0)
Monocytes Relative: 10 %
Neutro Abs: 4.2 10*3/uL (ref 1.7–7.7)
Neutrophils Relative %: 64 %
Platelets: 204 10*3/uL (ref 150–400)
RBC: 4 MIL/uL (ref 3.87–5.11)
RDW: 17.4 % — ABNORMAL HIGH (ref 11.5–15.5)
WBC: 6.5 10*3/uL (ref 4.0–10.5)
nRBC: 0 % (ref 0.0–0.2)

## 2022-05-29 LAB — COMPREHENSIVE METABOLIC PANEL
ALT: 16 U/L (ref 0–44)
AST: 29 U/L (ref 15–41)
Albumin: 4.1 g/dL (ref 3.5–5.0)
Alkaline Phosphatase: 74 U/L (ref 38–126)
Anion gap: 7 (ref 5–15)
BUN: 12 mg/dL (ref 8–23)
CO2: 27 mmol/L (ref 22–32)
Calcium: 9.4 mg/dL (ref 8.9–10.3)
Chloride: 104 mmol/L (ref 98–111)
Creatinine, Ser: 1.02 mg/dL — ABNORMAL HIGH (ref 0.44–1.00)
GFR, Estimated: 57 mL/min — ABNORMAL LOW (ref >60)
Glucose, Bld: 98 mg/dL (ref 70–99)
Potassium: 4.1 mmol/L (ref 3.5–5.1)
Sodium: 138 mmol/L (ref 135–145)
Total Bilirubin: 0.3 mg/dL (ref 0.3–1.2)
Total Protein: 7.1 g/dL (ref 6.5–8.1)

## 2022-05-29 LAB — PROTEIN, URINE, RANDOM: Total Protein, Urine: 22 mg/dL

## 2022-05-29 MED ORDER — ATROPINE SULFATE 1 MG/ML IV SOLN
0.4000 mg | Freq: Once | INTRAVENOUS | Status: AC
  Administered 2022-05-29: 0.4 mg via INTRAVENOUS

## 2022-05-29 MED ORDER — PALONOSETRON HCL INJECTION 0.25 MG/5ML
INTRAVENOUS | Status: DC
Start: 2022-05-29 — End: 2022-05-29
  Filled 2022-05-29: qty 5

## 2022-05-29 MED ORDER — SODIUM CHLORIDE 0.9 % IV SOLN
750.0000 mg | Freq: Once | INTRAVENOUS | Status: AC
  Administered 2022-05-29: 750 mg via INTRAVENOUS
  Filled 2022-05-29: qty 37.5

## 2022-05-29 MED ORDER — SODIUM CHLORIDE 0.9 % IV SOLN
10.0000 mg | Freq: Once | INTRAVENOUS | Status: AC
  Administered 2022-05-29: 10 mg via INTRAVENOUS
  Filled 2022-05-29: qty 10

## 2022-05-29 MED ORDER — DIPHENHYDRAMINE HCL 50 MG/ML IJ SOLN
INTRAMUSCULAR | Status: DC
Start: 2022-05-29 — End: 2022-05-29
  Filled 2022-05-29: qty 1

## 2022-05-29 MED ORDER — ATROPINE SULFATE 1 MG/ML IV SOLN
INTRAVENOUS | Status: AC
  Filled 2022-05-29: qty 1

## 2022-05-29 MED ORDER — DIPHENHYDRAMINE HCL 50 MG/ML IJ SOLN
25.0000 mg | Freq: Every day | INTRAMUSCULAR | Status: DC | PRN
  Administered 2022-05-29: 25 mg via INTRAVENOUS

## 2022-05-29 MED ORDER — SODIUM CHLORIDE 0.9 % IV SOLN
100.0000 mg/m2 | Freq: Once | INTRAVENOUS | Status: AC
  Administered 2022-05-29: 180 mg via INTRAVENOUS
  Filled 2022-05-29: qty 5

## 2022-05-29 MED ORDER — SODIUM CHLORIDE 0.9 % IV SOLN
2400.0000 mg/m2 | INTRAVENOUS | Status: DC
  Administered 2022-05-29: 4300 mg via INTRAVENOUS
  Filled 2022-05-29: qty 86

## 2022-05-29 MED ORDER — SODIUM CHLORIDE 0.9 % IV SOLN
350.0000 mg | Freq: Once | INTRAVENOUS | Status: AC
  Administered 2022-05-29: 350 mg via INTRAVENOUS
  Filled 2022-05-29: qty 14

## 2022-05-29 MED ORDER — SODIUM CHLORIDE 0.9 % IV SOLN
Freq: Once | INTRAVENOUS | Status: AC
  Filled 2022-05-29: qty 250

## 2022-05-29 MED ORDER — PALONOSETRON HCL INJECTION 0.25 MG/5ML
0.2500 mg | Freq: Once | INTRAVENOUS | Status: AC
  Administered 2022-05-29: 0.25 mg via INTRAVENOUS

## 2022-05-29 MED ORDER — SODIUM CHLORIDE 0.9% FLUSH
10.0000 mL | Freq: Once | INTRAVENOUS | Status: AC
  Administered 2022-05-29: 10 mL via INTRAVENOUS
  Filled 2022-05-29: qty 10

## 2022-05-29 NOTE — Progress Notes (Signed)
Hematology/Oncology Progress note [phone] [phone]      Patient Care Team: Olin Hauser, DO as PCP - General (Family Medicine) Clent Jacks, RN as Registered Nurse Earlie Server, MD as Consulting Physician (Hematology and Oncology) Cammie Sickle, MD as Consulting Physician (Oncology)  ASSESSMENT & PLAN:   Cancer Staging  Rectal cancer [facility]) Staging form: Colon and Rectum, AJCC 8th Edition - Pathologic stage from 10/06/2019: Stage IIC (ypT4b, pN0, cM0) - Signed by Earlie Server, MD on 10/06/2019 - Clinical stage from 06/30/2020: [name] - Signed by Earlie Server, MD on 04/17/2022 - Pathologic: Stage Unknown (rpTX, pNX, cM1) - Signed by Earlie Server, MD on 01/31/2021   Rectal cancer [facility]) History of stage IIIC Rectal cancer, s/p TNT, followed by 09/17/19 APR/posterior vaginectomy/TAH/BSO/VY-flap, pT4b pN0 with close vaginal margin 0.2 mm.  Uterus and ovaries negative for malignancy. palliative radiation to vaginal recurrence- 01/19/21 recurrence with lung metastasis.-Palliative -FOLFIRI plus bevacizumab.  Irinotecan was dropped in November 2022 due to side effects. Negative for UGT1A1*28 - radiographically stable, rise of CEA-July 2023 CT showed slightly progression of lung metastasis. Recommend to continue current regimen. Labs are reviewed and discussed with patient. Proceed with dose reduced FOLFIRI-[Irinotecan 100 mg/m2] / bevacizumab-currently she tolerates well RAS wild type. Consider switch to irinotecan +panitumumab after progression .  Encounter for antineoplastic chemotherapy Chemotherapy plan as listed above.   Chemotherapy induced neutropenia (HCC)  patient will receive long-acting GCSF  Ziextenzo prophylaxis on D3  Anemia due to antineoplastic chemotherapy Hb is stable. observation  Hypokalemia  potassium is stable.  Continue potassium chloride 40 mEq  daily.   History of pulmonary embolism Continue  Eliquis 2.5 mg twice daily  for anticoagulation prophylaxis.    Follow up in 2 weeks. Lab covering MD next cycle of chemo All questions were answered. The patient knows to call the clinic with any problems, questions or concerns.  Earlie Server, MD, PhD [facility] Hematology Oncology 05/29/2022        CHIEF COMPLAINTS/REASON FOR VISIT:  Follow up for rectal cancer  HISTORY OF PRESENTING ILLNESS:  Patient initially presented with complaints of postmenopausal bleeding on 08/16/2018.  History of was menopausal vaginal bleeding in 2016 which resulted in cervical polypectomy.  Pathology 02/04/2015 showed cervical polyp, consistent with benign endometrial polyp.  [name] after polypectomy due to anxiety associated with pelvic exams.  pelvic exam on 08/16/2018 reviewed cervical abnormality and from enlarged uterus. Seen by Dr. Marcelline Mates on 10/29/2018.  Endometrial biopsy and a Pap smear was performed. 10/29/2018 Pap smear showed adenocarcinoma, favor endometrial origin. 10/29/2018 endometrial biopsy showed endometrioid carcinoma, FIGO grade 1.  10/29/2018- TA & TV Ultrasound revealed: Anteverted uterus measuring 8.7 x 5.6 x 6.4 cm without evidence of focal masses.  The endometrium measuring 24.1 mm (thickened) and heterogeneous.  Right and left ovaries not visualized.  No adnexal masses identified.  No free fluid in cul-de-sac.  Patient was seen by Dr. Theora Gianotti in clinic on 11/13/2018.  Cervical exam reveals 2 cm exophytic irregular mass consistent with malignancy.   11/19/2018 CT chest abdomen pelvis with contrast showed thickened endometrium with some irregularity compatible with the provided diagnosis of endometrial malignancy.  There is a mildly prominent left inguinal node 1.4 cm.  Patient was seen by Dr. Fransisca Connors on 11/20/2018 and left groin lymph node biopsy was recommended.  11/26/2018 patient underwent left inguinal lymph node biopsy. Pathology showed metastatic adenocarcinoma consistent with colorectal origin.  CDX  2+.  Case was discussed on  tumor board.  Recommend colonoscopy for further evaluation.  Patient reports significant weight loss 30 pounds over the last year.  Chronic vaginal spotting. Change of bowel habits the past few months.  More constipated.  Family history positive for brother who has colon cancer prostate cancer.  patient has underwent colonoscopy on 12/03/2018 which reviewed a nonobstructing large mass in the rectum.  Also chronic fistula.  Mass was not circumferential.  This was biopsied with a cold forceps for histology.  Pathology came back hyperplastic polyp negative for dysplasia and malignancy. [name], patient underwent flex sigmoidoscopy on 12/06/2018 with rebiopsy of the rectal mass. This time biopsy results came back positive for invasive colorectal adenocarcinoma, moderately differentiated. Immunotherapy for nearly mismatch repair protein (MMR ) was performed.  There is no loss of MMR expression.  low probability of MSI high.   # Seen by [name] for evaluation of resectability for rectal cancer. In addition, she also had a second opinion with [facility] pathology where her endometrial biopsy pathology was changed to  adenocarcinoma, consistent with colorectal primary.   [name]. She has home health that has been assisting with ostomy care  Patient was also evaluated by [facility] oncology.  Recommendation is to proceed with TNT with concurrent chemoradiation followed by neoadjuvant chemotherapy followed by surgical resection. Patient prefers to have treatment done locally with [facility].   # Oncology Treatment:  02/03/2019- 03/19/2019  concurrent Xeloda and radiation.  Xeloda dose 81m /m2 BID - rounded to 16511mBID- on days of radiation. 04/09/2019, started on FOLFOX with bolus early.  Omitted.  07/16/2019 finished 8 cycles of FOLFOX. 09/17/19 APR/posterior vaginectomy/TAH/BSO/VY-flap pT4b pN0 with close vaginal margin 0.2  mm.  Uterus and ovaries negative for malignancy. Patient reports bilateral lower extremity numbness and tingling, intermittent, left worse than right. She has lost a lot of weight since her APR surgery.   #Family history with half brother having's history of colon cancer prostate cancer.  Personal history of colorectal cancer.  Patient has not decided if she wants genetic testing.    # history of PE( 01/13/2020)  in the bilateral lower extremity DVT (01/13/2020).   She finishes 6 months of anticoagulation with Eliquis 5 mg twice daily. Now switched to Eliquis 2.5 mg twice daily..  # She has now developed recurrent disease. #06/30/20  vaginal introitus mass biopsied. Pathology is consistent with metastatic colorectal adenocarcinoma I have discussed with [name]  Dr. MaHester Matesnd the mass is not resectable. Patient has also had colonoscopy by Dr. AnVicente Malesesterday. Normal examination. # 07/16/2020 cycle 1 FOLFIRI  # 07/20/2020 PET scan was done for further evaluation, images are consistent with local recurrence, no distant metastasis. #Discussed with radiation oncology Dr. ChBaruch Goutyill recommends concurrent chemotherapy and radiation. 08/02/2020-08/16/2020, patient starts radiation.  Xeloda was held due to neutropenia 08/17/2020,-09/06/2020 Xeloda 1500 mg twice daily concurrently with radiation  01/31/21 started on FOLFIRI + Bev 05/18/2021 CT chest abdomen pelvis showed Previously noted enlargement of bilateral inguinal lymph nodes is resolved, consistent with treatment response of nodal metastatic disease. Interval decrease in size of multiple small bilateral pulmonary nodules, consistent with treatment response of pulmonary metastatic disease. No evidence of new metastatic disease. 05/24/2021 - 08/30/2021, continued on FOLFIRI plus bevacizumab.  Irinotecan dose was reduced, eventually 1005m2  09/02/2021, CT chest abdomen pelvis without contrast Showed small bilateral pulmonary nodules, unchanged.   Stable metastatic disease.  No noncontrast evidence of new metastatic disease in the chest abdomen  pelvis.  Small parastomal hernia.  Enlargement of main pulmonary artery.  Coronary artery disease.  09/13/2021, maintenance 5-FU/bevacizumab 11/28/2021, 5-FU/Irinotecan/bevacizumab.  Irinotecan 100 mg/m2 was added back due to progressively increasing CEA.  INTERVAL HISTORY Jacqueline Yoder is a 76 y.o. female who has above history reviewed by me presents for follow-up of rectal cancer. Currently on dose reduced FOLFIRI/bevacizumab  overall she tolerates well.  Chronic diarrhea is manageable and stable symptoms. Improved with antidiarrhea PRN.  She was accompanied by her daughter today.   Patient has no new complaints.  Daughter mentioned that patient has slightly increased ostomy output after eating desserts at a recent party.  Patient reports that she has been doing well until she "eats something wrong".  She utilized Imodium as needed.  No other new complaints.  Denies any abdominal pain, nausea vomiting diarrhea.  Review of Systems  Constitutional:  Positive for fatigue. Negative for appetite change, chills, fever and unexpected weight change.  HENT:   Negative for hearing loss and voice change.   Eyes:  Negative for eye problems.  Respiratory:  Negative for chest tightness and cough.   Cardiovascular:  Negative for chest pain.  Gastrointestinal:  Negative for abdominal distention, abdominal pain, blood in stool, constipation, diarrhea and nausea.  Endocrine: Negative for hot flashes.  Genitourinary:  Negative for difficulty urinating and frequency.   Musculoskeletal:  Negative for arthralgias.  Skin:  Negative for itching and rash.  Neurological:  Negative for extremity weakness and numbness.  Hematological:  Negative for adenopathy.  Psychiatric/Behavioral:  Negative for confusion.     MEDICAL HISTORY:  Past Medical History:  Diagnosis Date   Allergy    Arthritis    Blood clot in vein     Family history of colon cancer    GERD (gastroesophageal reflux disease)    Hypercholesteremia    Hypertension    Hypertension    Lower extremity edema    Personal history of chemotherapy    Rectal cancer ([place]) 12/2018   Urinary incontinence     SURGICAL HISTORY: Past Surgical History:  Procedure Laterality Date   ABDOMINAL HYSTERECTOMY     CHOLECYSTECTOMY  1971   COLONOSCOPY WITH PROPOFOL N/A 12/03/2018   Procedure: COLONOSCOPY WITH PROPOFOL;  Surgeon: Lucilla Lame, MD;  Location: [facility] ENDOSCOPY;  Service: Endoscopy;  Laterality: N/A;   COLONOSCOPY WITH PROPOFOL N/A 07/15/2020   Procedure: COLONOSCOPY WITH PROPOFOL;  Surgeon: [name], MD;  Location: [facility] ENDOSCOPY;  Service: Gastroenterology;  Laterality: N/A;   FLEXIBLE SIGMOIDOSCOPY N/A 12/06/2018   Procedure: FLEXIBLE SIGMOIDOSCOPY;  Surgeon: [name], MD;  Location: [facility] ENDOSCOPY;  Service: Endoscopy;  Laterality: N/A;   LAPAROSCOPIC COLOSTOMY  01/06/2019   PORTACATH PLACEMENT N/A 04/03/2019   Procedure: INSERTION PORT-A-CATH;  Surgeon: Jules Husbands, MD;  Location: [facility] ORS;  Service: General;  Laterality: N/A;    SOCIAL HISTORY: Social History   Socioeconomic History   Marital status: Widowed    Spouse name: Not on file   Number of children: Not on file   Years of education: Not on file   Highest education level: Not on file  Occupational History   Not on file  Tobacco Use   Smoking status: Former    Types: Cigarettes    Quit date: 12/02/1977    Years since quitting: 44.5   Smokeless tobacco: Former  Scientific laboratory technician Use: Never used  Substance and Sexual Activity   Alcohol use: Never   Drug use: Never   Sexual activity:  Not Currently    Birth control/protection: None  Other Topics Concern   Not on file  Social History Narrative   Lives with daughter   Social Determinants of Health   Financial Resource Strain: Low Risk  (06/28/2021)   Overall Financial Resource Strain (CARDIA)    Difficulty  of Paying Living Expenses: Not hard at all  Food Insecurity: No Food Insecurity (06/28/2021)   Hunger Vital Sign    Worried About Running Out of Food in the Last Year: Never true    Ran Out of Food in the Last Year: Never true  Transportation Needs: No Transportation Needs (06/28/2021)   PRAPARE - Hydrologist (Medical): No    Lack of Transportation (Non-Medical): No  Physical Activity: Inactive (06/28/2021)   Exercise Vital Sign    Days of Exercise per Week: 0 days    Minutes of Exercise per Session: 0 min  Stress: No Stress Concern Present (06/28/2021)   [place]    Feeling of Stress : Not at all  Social Connections: Not on file  Intimate Partner Violence: Not on file    FAMILY HISTORY: Family History  Problem Relation Age of Onset   Colon cancer Brother 71       exposure to chemicals [place]   Hypertension Mother    Stroke Mother    Kidney failure Father    Breast cancer Neg Hx    Ovarian cancer Neg Hx     ALLERGIES:  is allergic to sulfamethoxazole-trimethoprim.  MEDICATIONS:  Current Outpatient Medications  Medication Sig Dispense Refill   Cholecalciferol (VITAMIN D3) 2000 units capsule Take 2,000 Units by mouth daily.     cholestyramine (QUESTRAN) 4 g packet Take 1 packet (4 g total) by mouth 3 (three) times daily. 90 each 1   diclofenac sodium (VOLTAREN) 1 % GEL Apply 2 g topically 4 (four) times daily as needed (joint pain).  11   diphenoxylate-atropine (LOMOTIL) 2.5-0.025 MG tablet Take 1 tablet by mouth 4 (four) times daily as needed for diarrhea or loose stools. 30 tablet 1   ELIQUIS 2.5 MG TABS tablet TAKE 1 TABLET BY MOUTH TWICE  DAILY 60 tablet 11   fluticasone (FLONASE) 50 MCG/ACT nasal spray USE 1 SPRAY IN EACH NOSTRIL ONCE DAILY 16 g 3   gabapentin (NEURONTIN) 100 MG capsule Take 1 capsule (100 mg total) by mouth at bedtime. 30 capsule 0   ipratropium (ATROVENT)  0.03 % nasal spray Place 1 spray into both nostrils 2 (two) times daily. 30 mL 2   lidocaine-prilocaine (EMLA) cream Apply 1 application. topically as needed. 30 g 6   loperamide (IMODIUM) 2 MG capsule Take 1 capsule (2 mg total) by mouth See admin instructions. With onset of loose stool, take 20m followed by 272mevery 2 hours,  Maximum: 16 mg/day 120 capsule 1   loratadine (CLARITIN) 10 MG tablet Take 10 mg by mouth daily.     Multiple Vitamins-Minerals (ONE-A-DAY WOMENS 50 PLUS PO) Take 1 tablet by mouth daily.      ondansetron (ZOFRAN) 8 MG tablet Take 1 tablet (8 mg total) by mouth 2 (two) times daily as needed for refractory nausea / vomiting. Start on day 3 after chemotherapy. 30 tablet 1   potassium chloride SA (KLOR-CON M) 20 MEQ tablet Take 2 tablets (40 mEq total) by mouth daily. 60 tablet 5   prochlorperazine (COMPAZINE) 10 MG tablet Take 1 tablet (10 mg total) by  mouth every 6 (six) hours as needed (NAUSEA). 30 tablet 1   simvastatin (ZOCOR) 40 MG tablet TAKE 1 TABLET BY MOUTH AT  BEDTIME 90 tablet 1   triamterene-hydrochlorothiazide (DYAZIDE) 37.5-25 MG capsule TAKE 1 CAPSULE BY MOUTH  DAILY 90 capsule 1   zinc gluconate 50 MG tablet Take 50 mg by mouth daily.     No current facility-administered medications for this visit.   Facility-Administered Medications Ordered in Other Visits  Medication Dose Route Frequency Provider Last Rate Last Admin   atropine 1 MG/ML injection            diphenhydrAMINE (BENADRYL) 50 MG/ML injection            diphenhydrAMINE (BENADRYL) injection 25 mg  25 mg Intravenous Daily PRN Earlie Server, MD   25 mg at 05/29/22 1256   fluorouracil (ADRUCIL) 4,300 mg in sodium chloride 0.9 % 64 mL chemo infusion  2,400 mg/m2 (Order-Specific) Intravenous 1 day or 1 dose Earlie Server, MD   4,300 mg at 05/29/22 1301   palonosetron (ALOXI) 0.25 MG/5ML injection            sodium chloride flush (NS) 0.9 % injection 10 mL  10 mL Intracatheter PRN Earlie Server, MD   10 mL at 03/22/22  1258     PHYSICAL EXAMINATION: ECOG PERFORMANCE STATUS: 1 - Symptomatic but completely ambulatory  Physical Exam Constitutional:      General: She is not in acute distress. HENT:     Head: Normocephalic and atraumatic.  Eyes:     General: No scleral icterus. Cardiovascular:     Rate and Rhythm: Normal rate and regular rhythm.     Heart sounds: Normal heart sounds.  Pulmonary:     Effort: Pulmonary effort is normal. No respiratory distress.     Breath sounds: No wheezing.  Abdominal:     General: Bowel sounds are normal. There is no distension.     Palpations: Abdomen is soft.     Comments: + Colostomy bag   Musculoskeletal:        General: No deformity. Normal range of motion.     Cervical back: Normal range of motion and neck supple.     Comments: Right lower extremity subcutaneous cystic nodule, mobile, non tender  Skin:    General: Skin is warm and dry.     Findings: No erythema or rash.  Neurological:     Mental Status: She is alert and oriented to person, place, and time. Mental status is at baseline.     Cranial Nerves: No cranial nerve deficit.     Coordination: Coordination normal.      LABORATORY DATA:  I have reviewed the data as listed Lab Results  Component Value Date   WBC 6.5 05/29/2022   HGB 12.1 05/29/2022   HCT 38.5 05/29/2022   MCV 96.3 05/29/2022   PLT 204 05/29/2022   Recent Labs    05/01/22 0841 05/15/22 0847 05/29/22 0837  NA 139 138 138  K 4.1 4.3 4.1  CL 105 103 104  CO2 '28 27 27  ' GLUCOSE 106* 99 98  BUN '10 9 12  ' CREATININE 0.97 0.77 1.02*  CALCIUM 9.2 9.1 9.4  GFRNONAA >60 >60 57*  PROT 6.7 6.6 7.1  ALBUMIN 4.0 4.1 4.1  AST '26 29 29  ' ALT '15 16 16  ' ALKPHOS 76 69 74  BILITOT 0.4 0.2* 0.3    Iron/TIBC/Ferritin/ %Sat    Component Value Date/Time   IRON 40 06/23/2020 1124  TIBC 246 (L) 06/23/2020 1124   FERRITIN 338 (H) 06/23/2020 1124   IRONPCTSAT 16 06/23/2020 1124     RADIOGRAPHIC STUDIES: I have personally  reviewed the radiological images as listed and agreed with the findings in the report. CT CHEST ABDOMEN PELVIS W CONTRAST  Result Date: 04/26/2022 CLINICAL DATA:  Patient with history of rectal carcinoma. On chemotherapy. * Tracking Code: BO * EXAM: CT CHEST, ABDOMEN, AND PELVIS WITH CONTRAST TECHNIQUE: Multidetector CT imaging of the chest, abdomen and pelvis was performed following the standard protocol during bolus administration of intravenous contrast. RADIATION DOSE REDUCTION: This exam was performed according to the departmental dose-optimization program which includes automated exposure control, adjustment of the mA and/or kV according to patient size and/or use of iterative reconstruction technique. CONTRAST:  128m OMNIPAQUE IOHEXOL 300 MG/ML  SOLN COMPARISON:  CT C AP January 20, 2022 FINDINGS: CT CHEST FINDINGS Cardiovascular: Right anterior chest wall Port-A-Cath is present with tip terminating in the superior vena cava. Normal heart size. Trace fluid superior pericardial recess. Thoracic aortic vascular calcifications. Mediastinum/Nodes: No enlarged axillary, mediastinal or hilar lymphadenopathy. Normal appearance of the esophagus. Lungs/Pleura: Central airways are patent. Dependent atelectasis within the lower lobes bilaterally. Stable 4 mm left upper lobe nodule (image 47; series 3). Stable 3 mm left upper lobe nodule (image 28; series 3). Biapical pleuroparenchymal thickening/scarring. Mild interval increase in size of 3 mm right upper lobe nodule (image 41; series 3), previously 2 mm. Interval increase in size of 8 mm right middle lobe nodule (image 104; series 3), previously 5 mm. Slight interval increase in size of 7 mm right middle lobe nodule (image 100; series 3), previously 5 mm. No pleural effusion or pneumothorax. Musculoskeletal: No aggressive or acute appearing osseous lesions. CT ABDOMEN PELVIS FINDINGS Hepatobiliary: Liver is normal in size and contour. No focal hepatic lesion  identified. Stable biliary ductal dilatation. Pancreas: Unremarkable Spleen: Unremarkable Adrenals/Urinary Tract: Normal adrenal glands. Kidneys enhance symmetrically with contrast. Bilateral renal cysts redemonstrated. No hydronephrosis. Urinary bladder is unremarkable. Stomach/Bowel: Patient is status post APR with left colostomy. No evidence for bowel obstruction. No free fluid or free intraperitoneal air. Normal morphology the stomach. Redemonstrated pelvic floor laxity and associated bulging of small bowel. Vascular/Lymphatic: Normal caliber abdominal aorta. Peripheral calcified atherosclerotic plaque. No retroperitoneal lymphadenopathy. Reproductive: Prior hysterectomy.  Pelvic masses. Other: None. Musculoskeletal: No aggressive or acute appearing osseous lesions. Lumbar spine degenerative changes. IMPRESSION: 1. Slight interval increase in size of a few of the scattered pulmonary nodules. Other nodules are stable. 2. Similar biliary ductal dilatation. 3. Prior APR with left colostomy, similar to prior. Electronically Signed   By: DLovey NewcomerM.D.   On: 04/26/2022 17:23      CT CHEST ABDOMEN PELVIS W CONTRAST  Result Date: 04/26/2022 CLINICAL DATA:  Patient with history of rectal carcinoma. On chemotherapy. * Tracking Code: BO * EXAM: CT CHEST, ABDOMEN, AND PELVIS WITH CONTRAST TECHNIQUE: Multidetector CT imaging of the chest, abdomen and pelvis was performed following the standard protocol during bolus administration of intravenous contrast. RADIATION DOSE REDUCTION: This exam was performed according to the departmental dose-optimization program which includes automated exposure control, adjustment of the mA and/or kV according to patient size and/or use of iterative reconstruction technique. CONTRAST:  107mOMNIPAQUE IOHEXOL 300 MG/ML  SOLN COMPARISON:  CT C AP January 20, 2022 FINDINGS: CT CHEST FINDINGS Cardiovascular: Right anterior chest wall Port-A-Cath is present with tip terminating in the  superior vena cava. Normal heart size. Trace fluid superior pericardial  recess. Thoracic aortic vascular calcifications. Mediastinum/Nodes: No enlarged axillary, mediastinal or hilar lymphadenopathy. Normal appearance of the esophagus. Lungs/Pleura: Central airways are patent. Dependent atelectasis within the lower lobes bilaterally. Stable 4 mm left upper lobe nodule (image 47; series 3). Stable 3 mm left upper lobe nodule (image 28; series 3). Biapical pleuroparenchymal thickening/scarring. Mild interval increase in size of 3 mm right upper lobe nodule (image 41; series 3), previously 2 mm. Interval increase in size of 8 mm right middle lobe nodule (image 104; series 3), previously 5 mm. Slight interval increase in size of 7 mm right middle lobe nodule (image 100; series 3), previously 5 mm. No pleural effusion or pneumothorax. Musculoskeletal: No aggressive or acute appearing osseous lesions. CT ABDOMEN PELVIS FINDINGS Hepatobiliary: Liver is normal in size and contour. No focal hepatic lesion identified. Stable biliary ductal dilatation. Pancreas: Unremarkable Spleen: Unremarkable Adrenals/Urinary Tract: Normal adrenal glands. Kidneys enhance symmetrically with contrast. Bilateral renal cysts redemonstrated. No hydronephrosis. Urinary bladder is unremarkable. Stomach/Bowel: Patient is status post APR with left colostomy. No evidence for bowel obstruction. No free fluid or free intraperitoneal air. Normal morphology the stomach. Redemonstrated pelvic floor laxity and associated bulging of small bowel. Vascular/Lymphatic: Normal caliber abdominal aorta. Peripheral calcified atherosclerotic plaque. No retroperitoneal lymphadenopathy. Reproductive: Prior hysterectomy.  Pelvic masses. Other: None. Musculoskeletal: No aggressive or acute appearing osseous lesions. Lumbar spine degenerative changes. IMPRESSION: 1. Slight interval increase in size of a few of the scattered pulmonary nodules. Other nodules are stable. 2.  Similar biliary ductal dilatation. 3. Prior APR with left colostomy, similar to prior. Electronically Signed   By: Lovey Newcomer M.D.   On: 04/26/2022 17:23

## 2022-05-29 NOTE — Assessment & Plan Note (Signed)
History of stage IIIC Rectal cancer, s/p TNT, followed by 09/17/19 APR/posterior vaginectomy/TAH/BSO/VY-flap, pT4b pN0 with close vaginal margin 0.2 mm.  Uterus and ovaries negative for malignancy. palliative radiation to vaginal recurrence- 01/19/21 recurrence with lung metastasis.-Palliative -FOLFIRI plus bevacizumab.  Irinotecan was dropped in November 2022 due to side effects. Negative for UGT1A1*28 - radiographically stable, rise of CEA-July 2023 CT showed slightly progression of lung metastasis. Recommend to continue current regimen. Labs are reviewed and discussed with patient. Proceed with dose reduced FOLFIRI-[Irinotecan 100 mg/m2] / bevacizumab-currently she tolerates well RAS wild type. Consider switch to irinotecan +panitumumab after progression .

## 2022-05-29 NOTE — Assessment & Plan Note (Signed)
Chemotherapy plan as listed above 

## 2022-05-29 NOTE — Assessment & Plan Note (Signed)
potassium is stable.  Continue potassium chloride 40 mEq  daily.  

## 2022-05-29 NOTE — Assessment & Plan Note (Signed)
patient will receive long-acting GCSF   Ziextenzo prophylaxis on D3 

## 2022-05-29 NOTE — Patient Instructions (Signed)
[facility] CANCER CTR AT [place]  Discharge Instructions: Thank you for choosing [facility] to provide your oncology and hematology care.  If you have a lab appointment with the [facility], please go directly to the [facility] and check in at the registration area.  Wear comfortable clothing and clothing appropriate for easy access to any Portacath or PICC line.   We strive to give you quality time with your provider. You may need to reschedule your appointment if you arrive late (15 or more minutes).  Arriving late affects you and other patients whose appointments are after yours.  Also, if you miss three or more appointments without notifying the office, you may be dismissed from the clinic at the provider's discretion.      For prescription refill requests, have your pharmacy contact our office and allow 72 hours for refills to be completed.    Today you received the following chemotherapy and/or immunotherapy agents: Avastin, Irinotecan, Adrucil      To help prevent nausea and vomiting after your treatment, we encourage you to take your nausea medication as directed.  BELOW ARE SYMPTOMS THAT SHOULD BE REPORTED IMMEDIATELY: *FEVER GREATER THAN 100.4 F (38 C) OR HIGHER *CHILLS OR SWEATING *NAUSEA AND VOMITING THAT IS NOT CONTROLLED WITH YOUR NAUSEA MEDICATION *UNUSUAL SHORTNESS OF BREATH *UNUSUAL BRUISING OR BLEEDING *URINARY PROBLEMS (pain or burning when urinating, or frequent urination) *BOWEL PROBLEMS (unusual diarrhea, constipation, pain near the anus) TENDERNESS IN MOUTH AND THROAT WITH OR WITHOUT PRESENCE OF ULCERS (sore throat, sores in mouth, or a toothache) UNUSUAL RASH, SWELLING OR PAIN  UNUSUAL VAGINAL DISCHARGE OR ITCHING   Items with * indicate a potential emergency and should be followed up as soon as possible or go to the Emergency Department if any problems should occur.  Please show the CHEMOTHERAPY ALERT CARD or IMMUNOTHERAPY ALERT  CARD at check-in to the Emergency Department and triage nurse.  Should you have questions after your visit or need to cancel or reschedule your appointment, please contact [facility] CANCER [place] AT [place]  [phone] and follow the prompts.  Office hours are 8:00 a.m. to 4:30 p.m. Monday - Friday. Please note that voicemails left after 4:00 p.m. may not be returned until the following business day.  We are closed weekends and major holidays. You have access to a nurse at all times for urgent questions. Please call the main number to the clinic [phone] and follow the prompts.  For any non-urgent questions, you may also contact your provider using MyChart. We now offer e-Visits for anyone 64 and older to request care online for non-urgent symptoms. For details visit [email or web address].   Also download the MyChart app! Go to the app store, search "MyChart", open the app, select [facility] [facility], and log in with your MyChart username and password.  Masks are optional in the cancer centers. If you would like for your care team to wear a mask while they are taking care of you, please let them know. For doctor visits, patients may have with them one support person who is at least 75 years old. At this time, visitors are not allowed in the infusion area.

## 2022-05-29 NOTE — Progress Notes (Signed)
ON PATHWAY REGIMEN - Colorectal  No Change  Continue With Treatment as Ordered.  Original Decision Date/Time: 01/21/2021 19:50     A cycle is every 14 days:     Bevacizumab-xxxx      Irinotecan      Leucovorin      Fluorouracil      Fluorouracil   **Always confirm dose/schedule in your pharmacy ordering system**  Patient Characteristics: Distant Metastases, Nonsurgical Candidate, KRAS/NRAS Wild-Type (BRAF V600 Wild-Type/Unknown), Standard Cytotoxic Therapy, Second Line Standard Cytotoxic Therapy, Bevacizumab Eligible Tumor Location: Rectal Therapeutic Status: Distant Metastases Microsatellite/Mismatch Repair Status: MSS/pMMR BRAF Mutation Status: Wild-Type (no mutation) KRAS/NRAS Mutation Status: Wild-Type (no mutation) Preferred Therapy Approach: Standard Cytotoxic Therapy Standard Cytotoxic Line of Therapy: Second Line Standard Cytotoxic Therapy Bevacizumab Eligibility: Eligible Intent of Therapy: Non-Curative / Palliative Intent, Discussed with Patient

## 2022-05-29 NOTE — Assessment & Plan Note (Signed)
Continue  Eliquis 2.5 mg twice daily for anticoagulation prophylaxis. 

## 2022-05-29 NOTE — Assessment & Plan Note (Signed)
Hb is stable. observation 

## 2022-05-30 ENCOUNTER — Encounter: Payer: Self-pay | Admitting: Oncology

## 2022-05-30 LAB — CEA: CEA: 1705 ng/mL — ABNORMAL HIGH (ref 0.0–4.7)

## 2022-05-30 NOTE — Progress Notes (Signed)
The following biosimilar Mvasi (bevacizumab-awwb) has been selected for use in this patient.  

## 2022-05-31 ENCOUNTER — Encounter: Payer: Self-pay | Admitting: Oncology

## 2022-05-31 ENCOUNTER — Inpatient Hospital Stay: Payer: Medicare Other

## 2022-05-31 VITALS — BP 128/59 | HR 57 | Temp 96.3°F | Resp 18

## 2022-05-31 DIAGNOSIS — C7800 Secondary malignant neoplasm of unspecified lung: Secondary | ICD-10-CM | POA: Diagnosis not present

## 2022-05-31 DIAGNOSIS — M47816 Spondylosis without myelopathy or radiculopathy, lumbar region: Secondary | ICD-10-CM | POA: Diagnosis not present

## 2022-05-31 DIAGNOSIS — T451X5A Adverse effect of antineoplastic and immunosuppressive drugs, initial encounter: Secondary | ICD-10-CM | POA: Diagnosis not present

## 2022-05-31 DIAGNOSIS — C20 Malignant neoplasm of rectum: Secondary | ICD-10-CM | POA: Diagnosis not present

## 2022-05-31 DIAGNOSIS — D6481 Anemia due to antineoplastic chemotherapy: Secondary | ICD-10-CM | POA: Diagnosis not present

## 2022-05-31 DIAGNOSIS — I10 Essential (primary) hypertension: Secondary | ICD-10-CM | POA: Diagnosis not present

## 2022-05-31 DIAGNOSIS — E7800 Pure hypercholesterolemia, unspecified: Secondary | ICD-10-CM | POA: Diagnosis not present

## 2022-05-31 DIAGNOSIS — Z87891 Personal history of nicotine dependence: Secondary | ICD-10-CM | POA: Diagnosis not present

## 2022-05-31 DIAGNOSIS — D701 Agranulocytosis secondary to cancer chemotherapy: Secondary | ICD-10-CM | POA: Diagnosis not present

## 2022-05-31 DIAGNOSIS — Z5111 Encounter for antineoplastic chemotherapy: Secondary | ICD-10-CM | POA: Diagnosis not present

## 2022-05-31 DIAGNOSIS — Z5189 Encounter for other specified aftercare: Secondary | ICD-10-CM | POA: Diagnosis not present

## 2022-05-31 DIAGNOSIS — K838 Other specified diseases of biliary tract: Secondary | ICD-10-CM | POA: Diagnosis not present

## 2022-05-31 DIAGNOSIS — Z933 Colostomy status: Secondary | ICD-10-CM | POA: Diagnosis not present

## 2022-05-31 DIAGNOSIS — Z79899 Other long term (current) drug therapy: Secondary | ICD-10-CM | POA: Diagnosis not present

## 2022-05-31 DIAGNOSIS — K219 Gastro-esophageal reflux disease without esophagitis: Secondary | ICD-10-CM | POA: Diagnosis not present

## 2022-05-31 DIAGNOSIS — Z86711 Personal history of pulmonary embolism: Secondary | ICD-10-CM | POA: Diagnosis not present

## 2022-05-31 DIAGNOSIS — C799 Secondary malignant neoplasm of unspecified site: Secondary | ICD-10-CM

## 2022-05-31 DIAGNOSIS — R634 Abnormal weight loss: Secondary | ICD-10-CM | POA: Diagnosis not present

## 2022-05-31 DIAGNOSIS — Z800 Family history of malignant neoplasm of digestive organs: Secondary | ICD-10-CM | POA: Diagnosis not present

## 2022-05-31 MED ORDER — SODIUM CHLORIDE 0.9% FLUSH
10.0000 mL | INTRAVENOUS | Status: DC | PRN
  Administered 2022-05-31: 10 mL
  Filled 2022-05-31: qty 10

## 2022-05-31 MED ORDER — PEGFILGRASTIM INJECTION 6 MG/0.6ML ~~LOC~~
6.0000 mg | PREFILLED_SYRINGE | Freq: Once | SUBCUTANEOUS | Status: AC
  Administered 2022-05-31: 6 mg via SUBCUTANEOUS

## 2022-05-31 MED ORDER — HEPARIN SOD (PORK) LOCK FLUSH 100 UNIT/ML IV SOLN
500.0000 [IU] | Freq: Once | INTRAVENOUS | Status: AC | PRN
  Administered 2022-05-31: 500 [IU]
  Filled 2022-05-31: qty 5

## 2022-05-31 NOTE — Patient Instructions (Signed)
[facility] CANCER CTR AT [place]  Discharge Instructions: Thank you for choosing [facility] to provide your oncology and hematology care.  If you have a lab appointment with the [facility], please go directly to the [facility] and check in at the registration area.  Wear comfortable clothing and clothing appropriate for easy access to any Portacath or PICC line.   We strive to give you quality time with your provider. You may need to reschedule your appointment if you arrive late (15 or more minutes).  Arriving late affects you and other patients whose appointments are after yours.  Also, if you miss three or more appointments without notifying the office, you may be dismissed from the clinic at the provider's discretion.      For prescription refill requests, have your pharmacy contact our office and allow 72 hours for refills to be completed.    Today you received the following chemotherapy and/or immunotherapy agents pump stop       To help prevent nausea and vomiting after your treatment, we encourage you to take your nausea medication as directed.  BELOW ARE SYMPTOMS THAT SHOULD BE REPORTED IMMEDIATELY: *FEVER GREATER THAN 100.4 F (38 C) OR HIGHER *CHILLS OR SWEATING *NAUSEA AND VOMITING THAT IS NOT CONTROLLED WITH YOUR NAUSEA MEDICATION *UNUSUAL SHORTNESS OF BREATH *UNUSUAL BRUISING OR BLEEDING *URINARY PROBLEMS (pain or burning when urinating, or frequent urination) *BOWEL PROBLEMS (unusual diarrhea, constipation, pain near the anus) TENDERNESS IN MOUTH AND THROAT WITH OR WITHOUT PRESENCE OF ULCERS (sore throat, sores in mouth, or a toothache) UNUSUAL RASH, SWELLING OR PAIN  UNUSUAL VAGINAL DISCHARGE OR ITCHING   Items with * indicate a potential emergency and should be followed up as soon as possible or go to the Emergency Department if any problems should occur.  Please show the CHEMOTHERAPY ALERT CARD or IMMUNOTHERAPY ALERT CARD at check-in to  the Emergency Department and triage nurse.  Should you have questions after your visit or need to cancel or reschedule your appointment, please contact [facility] CANCER [place] AT [place]  [phone] and follow the prompts.  Office hours are 8:00 a.m. to 4:30 p.m. Monday - Friday. Please note that voicemails left after 4:00 p.m. may not be returned until the following business day.  We are closed weekends and major holidays. You have access to a nurse at all times for urgent questions. Please call the main number to the clinic [phone] and follow the prompts.  For any non-urgent questions, you may also contact your provider using MyChart. We now offer e-Visits for anyone 58 and older to request care online for non-urgent symptoms. For details visit [email or web address].   Also download the MyChart app! Go to the app store, search "MyChart", open the app, select [place], and log in with your MyChart username and password.  Masks are optional in the cancer centers. If you would like for your care team to wear a mask while they are taking care of you, please let them know. For doctor visits, patients may have with them one support person who is at least 75 years old. At this time, visitors are not allowed in the infusion area.

## 2022-05-31 NOTE — Progress Notes (Signed)
The following biosimilar Mvasi (bevacizumab-awwb) has been selected for use in this patient.  

## 2022-06-05 DIAGNOSIS — Z933 Colostomy status: Secondary | ICD-10-CM | POA: Diagnosis not present

## 2022-06-09 MED FILL — Dexamethasone Sodium Phosphate Inj 100 MG/10ML: INTRAMUSCULAR | Qty: 1 | Status: AC

## 2022-06-12 ENCOUNTER — Inpatient Hospital Stay (HOSPITAL_BASED_OUTPATIENT_CLINIC_OR_DEPARTMENT_OTHER): Payer: Medicare Other | Admitting: Oncology

## 2022-06-12 ENCOUNTER — Inpatient Hospital Stay: Payer: Medicare Other

## 2022-06-12 ENCOUNTER — Encounter: Payer: Self-pay | Admitting: Oncology

## 2022-06-12 VITALS — BP 118/59 | HR 61 | Temp 97.0°F | Resp 18 | Wt 157.0 lb

## 2022-06-12 DIAGNOSIS — C799 Secondary malignant neoplasm of unspecified site: Secondary | ICD-10-CM | POA: Diagnosis not present

## 2022-06-12 DIAGNOSIS — K219 Gastro-esophageal reflux disease without esophagitis: Secondary | ICD-10-CM | POA: Diagnosis not present

## 2022-06-12 DIAGNOSIS — Z79899 Other long term (current) drug therapy: Secondary | ICD-10-CM | POA: Diagnosis not present

## 2022-06-12 DIAGNOSIS — Z86711 Personal history of pulmonary embolism: Secondary | ICD-10-CM | POA: Diagnosis not present

## 2022-06-12 DIAGNOSIS — C20 Malignant neoplasm of rectum: Secondary | ICD-10-CM

## 2022-06-12 DIAGNOSIS — Z87891 Personal history of nicotine dependence: Secondary | ICD-10-CM | POA: Diagnosis not present

## 2022-06-12 DIAGNOSIS — D701 Agranulocytosis secondary to cancer chemotherapy: Secondary | ICD-10-CM | POA: Diagnosis not present

## 2022-06-12 DIAGNOSIS — Z933 Colostomy status: Secondary | ICD-10-CM | POA: Diagnosis not present

## 2022-06-12 DIAGNOSIS — D6481 Anemia due to antineoplastic chemotherapy: Secondary | ICD-10-CM

## 2022-06-12 DIAGNOSIS — C7800 Secondary malignant neoplasm of unspecified lung: Secondary | ICD-10-CM | POA: Diagnosis not present

## 2022-06-12 DIAGNOSIS — M47816 Spondylosis without myelopathy or radiculopathy, lumbar region: Secondary | ICD-10-CM | POA: Diagnosis not present

## 2022-06-12 DIAGNOSIS — Z5111 Encounter for antineoplastic chemotherapy: Secondary | ICD-10-CM | POA: Diagnosis not present

## 2022-06-12 DIAGNOSIS — T451X5A Adverse effect of antineoplastic and immunosuppressive drugs, initial encounter: Secondary | ICD-10-CM

## 2022-06-12 DIAGNOSIS — Z5189 Encounter for other specified aftercare: Secondary | ICD-10-CM | POA: Diagnosis not present

## 2022-06-12 DIAGNOSIS — R634 Abnormal weight loss: Secondary | ICD-10-CM | POA: Diagnosis not present

## 2022-06-12 DIAGNOSIS — K838 Other specified diseases of biliary tract: Secondary | ICD-10-CM | POA: Diagnosis not present

## 2022-06-12 DIAGNOSIS — I10 Essential (primary) hypertension: Secondary | ICD-10-CM | POA: Diagnosis not present

## 2022-06-12 DIAGNOSIS — Z800 Family history of malignant neoplasm of digestive organs: Secondary | ICD-10-CM | POA: Diagnosis not present

## 2022-06-12 DIAGNOSIS — E7800 Pure hypercholesterolemia, unspecified: Secondary | ICD-10-CM | POA: Diagnosis not present

## 2022-06-12 LAB — COMPREHENSIVE METABOLIC PANEL
ALT: 16 U/L (ref 0–44)
AST: 26 U/L (ref 15–41)
Albumin: 3.9 g/dL (ref 3.5–5.0)
Alkaline Phosphatase: 78 U/L (ref 38–126)
Anion gap: 5 (ref 5–15)
BUN: 12 mg/dL (ref 8–23)
CO2: 28 mmol/L (ref 22–32)
Calcium: 9.1 mg/dL (ref 8.9–10.3)
Chloride: 105 mmol/L (ref 98–111)
Creatinine, Ser: 0.84 mg/dL (ref 0.44–1.00)
GFR, Estimated: >60 mL/min (ref >60)
Glucose, Bld: 97 mg/dL (ref 70–99)
Potassium: 4.1 mmol/L (ref 3.5–5.1)
Sodium: 138 mmol/L (ref 135–145)
Total Bilirubin: 0.5 mg/dL (ref 0.3–1.2)
Total Protein: 6.6 g/dL (ref 6.5–8.1)

## 2022-06-12 LAB — CBC WITH DIFFERENTIAL/PLATELET
Abs Immature Granulocytes: 0.05 10*3/uL (ref 0.00–0.07)
Basophils Absolute: 0 10*3/uL (ref 0.0–0.1)
Basophils Relative: 1 %
Eosinophils Absolute: 0.1 10*3/uL (ref 0.0–0.5)
Eosinophils Relative: 2 %
HCT: 37.1 % (ref 36.0–46.0)
Hemoglobin: 11.7 g/dL — ABNORMAL LOW (ref 12.0–15.0)
Immature Granulocytes: 1 %
Lymphocytes Relative: 21 %
Lymphs Abs: 1.2 10*3/uL (ref 0.7–4.0)
MCH: 30.4 pg (ref 26.0–34.0)
MCHC: 31.5 g/dL (ref 30.0–36.0)
MCV: 96.4 fL (ref 80.0–100.0)
Monocytes Absolute: 0.6 10*3/uL (ref 0.1–1.0)
Monocytes Relative: 10 %
Neutro Abs: 3.8 10*3/uL (ref 1.7–7.7)
Neutrophils Relative %: 65 %
Platelets: 210 10*3/uL (ref 150–400)
RBC: 3.85 MIL/uL — ABNORMAL LOW (ref 3.87–5.11)
RDW: 17.5 % — ABNORMAL HIGH (ref 11.5–15.5)
WBC: 5.8 10*3/uL (ref 4.0–10.5)
nRBC: 0 % (ref 0.0–0.2)

## 2022-06-12 LAB — PROTEIN, URINE, RANDOM: Total Protein, Urine: 16 mg/dL

## 2022-06-12 MED ORDER — ATROPINE SULFATE 1 MG/ML IV SOLN
0.5000 mg | Freq: Once | INTRAVENOUS | Status: AC | PRN
  Administered 2022-06-12: 0.5 mg via INTRAVENOUS
  Filled 2022-06-12: qty 1

## 2022-06-12 MED ORDER — PALONOSETRON HCL INJECTION 0.25 MG/5ML
0.2500 mg | Freq: Once | INTRAVENOUS | Status: AC
  Administered 2022-06-12: 0.25 mg via INTRAVENOUS
  Filled 2022-06-12: qty 5

## 2022-06-12 MED ORDER — SODIUM CHLORIDE 0.9 % IV SOLN
10.0000 mg | Freq: Once | INTRAVENOUS | Status: AC
  Administered 2022-06-12: 10 mg via INTRAVENOUS
  Filled 2022-06-12: qty 10

## 2022-06-12 MED ORDER — FLUOROURACIL CHEMO INJECTION 2.5 GM/50ML: 400.0000 mg/m2 | Freq: Once | INTRAVENOUS | Status: DC

## 2022-06-12 MED ORDER — DIPHENHYDRAMINE HCL 50 MG/ML IJ SOLN
25.0000 mg | Freq: Every day | INTRAMUSCULAR | Status: DC | PRN
  Administered 2022-06-12: 25 mg via INTRAVENOUS
  Filled 2022-06-12: qty 1

## 2022-06-12 MED ORDER — SODIUM CHLORIDE 0.9 % IV SOLN
750.0000 mg | Freq: Once | INTRAVENOUS | Status: AC
  Administered 2022-06-12: 750 mg via INTRAVENOUS
  Filled 2022-06-12: qty 37.5

## 2022-06-12 MED ORDER — SODIUM CHLORIDE 0.9 % IV SOLN
2400.0000 mg/m2 | INTRAVENOUS | Status: DC
  Administered 2022-06-12: 4300 mg via INTRAVENOUS
  Filled 2022-06-12: qty 86

## 2022-06-12 MED ORDER — SODIUM CHLORIDE 0.9 % IV SOLN
Freq: Once | INTRAVENOUS | Status: AC
  Filled 2022-06-12: qty 250

## 2022-06-12 MED ORDER — SODIUM CHLORIDE 0.9 % IV SOLN
5.0000 mg/kg | Freq: Once | INTRAVENOUS | Status: AC
  Administered 2022-06-12: 350 mg via INTRAVENOUS
  Filled 2022-06-12: qty 14

## 2022-06-12 MED ORDER — SODIUM CHLORIDE 0.9 % IV SOLN
100.0000 mg/m2 | Freq: Once | INTRAVENOUS | Status: AC
  Administered 2022-06-12: 180 mg via INTRAVENOUS
  Filled 2022-06-12: qty 5

## 2022-06-12 NOTE — Assessment & Plan Note (Addendum)
History of stage IIIC Rectal cancer, s/p TNT, followed by 09/17/19 APR/posterior vaginectomy/TAH/BSO/VY-flap, pT4b pN0 with close vaginal margin 0.2 mm.  Uterus and ovaries negative for malignancy. palliative radiation to vaginal recurrence- 01/19/21 recurrence with lung metastasis.-Palliative -FOLFIRI plus bevacizumab.  Irinotecan was dropped in November 2022 due to side effects. Negative for UGT1A1*28 - radiographically stable, rise of CEA-July 2023 CT showed slightly progression of lung metastasis. Recommend to continue current regimen. Labs are reviewed and discussed with patient. Proceed with dose reduced FOLFIRI-[Irinotecan 100 mg/m2] / bevacizumab-currently she tolerates well RAS wild type. Consider switch to irinotecan +panitumumab upon significant radiographic progression .

## 2022-06-12 NOTE — Assessment & Plan Note (Signed)
Hb is stable. observation 

## 2022-06-12 NOTE — Assessment & Plan Note (Signed)
Chemotherapy plan as listed above 

## 2022-06-12 NOTE — Assessment & Plan Note (Signed)
patient will receive long-acting GCSF   Ziextenzo prophylaxis on D3 

## 2022-06-12 NOTE — Progress Notes (Signed)
Patient here for oncology follow-up appointment, expresses no new concerns     

## 2022-06-12 NOTE — Patient Instructions (Addendum)
[facility] CANCER CTR AT [place]  Discharge Instructions: Thank you for choosing [facility] to provide your oncology and hematology care.  If you have a lab appointment with the [facility], please go directly to the [facility] and check in at the registration area.  Wear comfortable clothing and clothing appropriate for easy access to any Portacath or PICC line.   We strive to give you quality time with your provider. You may need to reschedule your appointment if you arrive late (15 or more minutes).  Arriving late affects you and other patients whose appointments are after yours.  Also, if you miss three or more appointments without notifying the office, you may be dismissed from the clinic at the provider's discretion.      For prescription refill requests, have your pharmacy contact our office and allow 72 hours for refills to be completed.    Today you received the following chemotherapy and/or immunotherapy agents  Irinotecan, Leucovorin, Mvasi, Adrucil       To help prevent nausea and vomiting after your treatment, we encourage you to take your nausea medication as directed.  BELOW ARE SYMPTOMS THAT SHOULD BE REPORTED IMMEDIATELY: *FEVER GREATER THAN 100.4 F (38 C) OR HIGHER *CHILLS OR SWEATING *NAUSEA AND VOMITING THAT IS NOT CONTROLLED WITH YOUR NAUSEA MEDICATION *UNUSUAL SHORTNESS OF BREATH *UNUSUAL BRUISING OR BLEEDING *URINARY PROBLEMS (pain or burning when urinating, or frequent urination) *BOWEL PROBLEMS (unusual diarrhea, constipation, pain near the anus) TENDERNESS IN MOUTH AND THROAT WITH OR WITHOUT PRESENCE OF ULCERS (sore throat, sores in mouth, or a toothache) UNUSUAL RASH, SWELLING OR PAIN  UNUSUAL VAGINAL DISCHARGE OR ITCHING   Items with * indicate a potential emergency and should be followed up as soon as possible or go to the Emergency Department if any problems should occur.  Please show the CHEMOTHERAPY ALERT CARD or  IMMUNOTHERAPY ALERT CARD at check-in to the Emergency Department and triage nurse.  Should you have questions after your visit or need to cancel or reschedule your appointment, please contact [facility] CANCER [place] AT [place]  [phone] and follow the prompts.  Office hours are 8:00 a.m. to 4:30 p.m. Monday - Friday. Please note that voicemails left after 4:00 p.m. may not be returned until the following business day.  We are closed weekends and major holidays. You have access to a nurse at all times for urgent questions. Please call the main number to the clinic [phone] and follow the prompts.  For any non-urgent questions, you may also contact your provider using MyChart. We now offer e-Visits for anyone 75 and older to request care online for non-urgent symptoms. For details visit [email or web address].   Also download the MyChart app! Go to the app store, search "MyChart", open the app, select [place], and log in with your MyChart username and password.  Masks are optional in the cancer centers. If you would like for your care team to wear a mask while they are taking care of you, please let them know. For doctor visits, patients may have with them one support person who is at least 75 years old. At this time, visitors are not allowed in the infusion area.

## 2022-06-12 NOTE — Progress Notes (Signed)
Hematology/Oncology Progress note [phone] [phone]      Patient Care Team: Jacqueline Hauser, DO as PCP - General (Family Medicine) Jacqueline Jacks, RN as Registered Nurse Jacqueline Server, MD as Consulting Physician (Hematology and Oncology) Jacqueline Sickle, MD as Consulting Physician (Oncology)  ASSESSMENT & PLAN:   Cancer Staging  Rectal cancer [facility]) Staging form: Colon and Rectum, AJCC 8th Edition - Pathologic stage from 10/06/2019: Stage IIC (ypT4b, pN0, cM0) - Signed by Jacqueline Server, MD on 10/06/2019 - Clinical stage from 06/30/2020: Jacqueline Yoder - Signed by Jacqueline Server, MD on 04/17/2022 - Pathologic: Stage Unknown (rpTX, pNX, cM1) - Signed by Jacqueline Server, MD on 01/31/2021   Rectal cancer [facility]) History of stage IIIC Rectal cancer, s/p TNT, followed by 09/17/19 APR/posterior vaginectomy/TAH/BSO/VY-flap, pT4b pN0 with close vaginal margin 0.2 mm.  Uterus and ovaries negative for malignancy. palliative radiation to vaginal recurrence- 01/19/21 recurrence with lung metastasis.-Palliative -FOLFIRI plus bevacizumab.  Irinotecan was dropped in November 2022 due to side effects. Negative for UGT1A1*28 - radiographically stable, rise of CEA-July 2023 CT showed slightly progression of lung metastasis. Recommend to continue current regimen. Labs are reviewed and discussed with patient. Proceed with dose reduced FOLFIRI-[Irinotecan 100 mg/m2] / bevacizumab-currently she tolerates well RAS wild type. Consider switch to irinotecan +panitumumab upon significant radiographic progression .  Encounter for antineoplastic chemotherapy Chemotherapy plan as listed above.   Anemia due to antineoplastic chemotherapy Hb is stable. observation  Chemotherapy induced neutropenia (HCC)  patient will receive long-acting GCSF   Ziextenzo prophylaxis on D3  History of pulmonary embolism Continue  Eliquis 2.5 mg twice daily for anticoagulation prophylaxis.   Follow up in 2 weeks.  Lab covering MD next cycle of chemo All questions were answered. The patient knows to call the clinic with any problems, questions or concerns.  Jacqueline Server, MD, PhD [facility] Hematology Oncology 06/12/2022        CHIEF COMPLAINTS/REASON FOR VISIT:  Follow up for rectal cancer  HISTORY OF PRESENTING ILLNESS:  Patient initially presented with complaints of postmenopausal bleeding on 08/16/2018.  History of was menopausal vaginal bleeding in 2016 which resulted in cervical polypectomy.  Pathology 02/04/2015 showed cervical polyp, consistent with benign endometrial polyp.  [name] after polypectomy due to anxiety associated with pelvic exams.  pelvic exam on 08/16/2018 reviewed cervical abnormality and from enlarged uterus. Seen by Dr. Marcelline Yoder on 10/29/2018.  Endometrial biopsy and a Pap smear was performed. 10/29/2018 Pap smear showed adenocarcinoma, favor endometrial origin. 10/29/2018 endometrial biopsy showed endometrioid carcinoma, FIGO grade 1.  10/29/2018- TA & TV Ultrasound revealed: Anteverted uterus measuring 8.7 x 5.6 x 6.4 cm without evidence of focal masses.  The endometrium measuring 24.1 mm (thickened) and heterogeneous.  Right and left ovaries not visualized.  No adnexal masses identified.  No free fluid in cul-de-sac.  Patient was seen by Dr. Theora Yoder in clinic on 11/13/2018.  Cervical exam reveals 2 cm exophytic irregular mass consistent with malignancy.   11/19/2018 CT chest abdomen pelvis with contrast showed thickened endometrium with some irregularity compatible with the provided diagnosis of endometrial malignancy.  There is a mildly prominent left inguinal node 1.4 cm.  Patient was seen by Dr. Fransisca Yoder on 11/20/2018 and left groin lymph node biopsy was recommended.  11/26/2018 patient underwent left inguinal lymph node biopsy. Pathology showed metastatic adenocarcinoma consistent with colorectal origin.  CDX 2+.  Case was discussed on tumor board.  Recommend  colonoscopy for further evaluation.  Patient reports significant weight  loss 30 pounds over the last year.  Chronic vaginal spotting. Change of bowel habits the past few months.  More constipated.  Family history positive for brother who has colon cancer prostate cancer.  patient has underwent colonoscopy on 12/03/2018 which reviewed a nonobstructing large mass in the rectum.  Also chronic fistula.  Mass was not circumferential.  This was biopsied with a cold forceps for histology.  Pathology came back hyperplastic polyp negative for dysplasia and malignancy. [name], patient underwent flex sigmoidoscopy on 12/06/2018 with rebiopsy of the rectal mass. This time biopsy results came back positive for invasive colorectal adenocarcinoma, moderately differentiated. Immunotherapy for nearly mismatch repair protein (MMR ) was performed.  There is no loss of MMR expression.  low probability of MSI high.   # Seen by Yoder surgery for evaluation of resectability for rectal cancer. In addition, she also had a second opinion with Yoder pathology where her endometrial biopsy pathology was changed to  adenocarcinoma, consistent with colorectal primary.   [name]. She has home health that has been assisting with ostomy care  Patient was also evaluated by [facility] oncology.  Recommendation is to proceed with TNT with concurrent chemoradiation followed by neoadjuvant chemotherapy followed by surgical resection. Patient prefers to have treatment done locally with [facility].   # Oncology Treatment:  02/03/2019- 03/19/2019  concurrent Xeloda and radiation.  Xeloda dose 854m /m2 BID - rounded to 16558mBID- on days of radiation. 04/09/2019, started on FOLFOX with bolus early.  Omitted.  07/16/2019 finished 8 cycles of FOLFOX. 09/17/19 APR/posterior vaginectomy/TAH/BSO/VY-flap pT4b pN0 with close vaginal margin 0.2 mm.  Uterus and ovaries negative for  malignancy. Patient reports bilateral lower extremity numbness and tingling, intermittent, left worse than right. She has lost a lot of weight since her APR surgery.   #Family history with half brother having's history of colon cancer prostate cancer.  Personal history of colorectal cancer.  Patient has not decided if she wants genetic testing.    # history of PE( 01/13/2020)  in the bilateral lower extremity DVT (01/13/2020).   She finishes 6 months of anticoagulation with Eliquis 5 mg twice daily. Now switched to Eliquis 2.5 mg twice daily..  # She has now developed recurrent disease. #06/30/20  vaginal introitus mass biopsied. Pathology is consistent with metastatic colorectal adenocarcinoma I have discussed with Yoder surgery  Dr. MaHester Matesnd the mass is not resectable. Patient has also had colonoscopy by Dr. AnVicente Malesesterday. Normal examination. # 07/16/2020 cycle 1 FOLFIRI  # 07/20/2020 PET scan was done for further evaluation, images are consistent with local recurrence, no distant metastasis. #Discussed with radiation oncology Dr. ChBaruch Goutyill recommends concurrent chemotherapy and radiation. 08/02/2020-08/16/2020, patient starts radiation.  Xeloda was held due to neutropenia 08/17/2020,-09/06/2020 Xeloda 1500 mg twice daily concurrently with radiation  01/31/21 started on FOLFIRI + Bev 05/18/2021 CT chest abdomen pelvis showed Previously noted enlargement of bilateral inguinal lymph nodes is resolved, consistent with treatment response of nodal metastatic disease. Interval decrease in size of multiple small bilateral pulmonary nodules, consistent with treatment response of pulmonary metastatic disease. No evidence of new metastatic disease. 05/24/2021 - 08/30/2021, continued on FOLFIRI plus bevacizumab.  Irinotecan dose was reduced, eventually 10070m2  09/02/2021, CT chest abdomen pelvis without contrast Showed small bilateral pulmonary nodules, unchanged.  Stable metastatic disease.  No  noncontrast evidence of new metastatic disease in the chest abdomen pelvis.  Small parastomal hernia.  Enlargement of main pulmonary artery.  Coronary  artery disease.  09/13/2021, maintenance 5-FU/bevacizumab 11/28/2021, 5-FU/Irinotecan/bevacizumab.  Irinotecan 100 mg/m2 was added back due to progressively increasing CEA.  INTERVAL HISTORY Jacqueline Yoder is a 76 y.o. female who has above history reviewed by me presents for follow-up of rectal cancer. Currently on dose reduced FOLFIRI/bevacizumab  overall she tolerates well.  Chronic diarrhea is manageable and stable symptoms. Improved with antidiarrhea PRN.  She was accompanied by her daughter today.  No new complaints. Denies any sob, chest pain, nausea/vomiting.   Review of Systems  Constitutional:  Positive for fatigue. Negative for appetite change, chills, fever and unexpected weight change.  HENT:   Negative for hearing loss and voice change.   Eyes:  Negative for eye problems.  Respiratory:  Negative for chest tightness and cough.   Cardiovascular:  Negative for chest pain.  Gastrointestinal:  Negative for abdominal distention, abdominal pain, blood in stool, constipation, diarrhea and nausea.  Endocrine: Negative for hot flashes.  Genitourinary:  Negative for difficulty urinating and frequency.   Musculoskeletal:  Negative for arthralgias.  Skin:  Negative for itching and rash.  Neurological:  Negative for extremity weakness and numbness.  Hematological:  Negative for adenopathy.  Psychiatric/Behavioral:  Negative for confusion.     MEDICAL HISTORY:  Past Medical History:  Diagnosis Date   Allergy    Arthritis    Blood clot in vein    Family history of colon cancer    GERD (gastroesophageal reflux disease)    Hypercholesteremia    Hypertension    Hypertension    Lower extremity edema    Personal history of chemotherapy    Rectal cancer ([place]) 12/2018   Urinary incontinence     SURGICAL HISTORY: Past Surgical  History:  Procedure Laterality Date   ABDOMINAL HYSTERECTOMY     CHOLECYSTECTOMY  1971   COLONOSCOPY WITH PROPOFOL N/A 12/03/2018   Procedure: COLONOSCOPY WITH PROPOFOL;  Surgeon: Lucilla Lame, MD;  Location: [facility] ENDOSCOPY;  Service: Endoscopy;  Laterality: N/A;   COLONOSCOPY WITH PROPOFOL N/A 07/15/2020   Procedure: COLONOSCOPY WITH PROPOFOL;  Surgeon: [name], MD;  Location: [facility] ENDOSCOPY;  Service: Gastroenterology;  Laterality: N/A;   FLEXIBLE SIGMOIDOSCOPY N/A 12/06/2018   Procedure: FLEXIBLE SIGMOIDOSCOPY;  Surgeon: [name], MD;  Location: [facility] ENDOSCOPY;  Service: Endoscopy;  Laterality: N/A;   LAPAROSCOPIC COLOSTOMY  01/06/2019   PORTACATH PLACEMENT N/A 04/03/2019   Procedure: INSERTION PORT-A-CATH;  Surgeon: Jules Husbands, MD;  Location: [facility] ORS;  Service: General;  Laterality: N/A;    SOCIAL HISTORY: Social History   Socioeconomic History   Marital status: Widowed    Spouse name: Not on file   Number of children: Not on file   Years of education: Not on file   Highest education level: Not on file  Occupational History   Not on file  Tobacco Use   Smoking status: Former    Types: Cigarettes    Quit date: 12/02/1977    Years since quitting: 44.5   Smokeless tobacco: Former  Scientific laboratory technician Use: Never used  Substance and Sexual Activity   Alcohol use: Never   Drug use: Never   Sexual activity: Not Currently    Birth control/protection: None  Other Topics Concern   Not on file  Social History Narrative   Lives with daughter   Social Determinants of Health   Financial Resource Strain: [place]  (06/28/2021)   Overall Financial Resource Strain (CARDIA)    Difficulty of Paying Living Expenses: Not hard at  all  Food Insecurity: No Food Insecurity (06/28/2021)   Hunger Vital Sign    Worried About Running Out of Food in the Last Year: Never true    Ran Out of Food in the Last Year: Never true  Transportation Needs: No Transportation Needs (06/28/2021)    PRAPARE - Hydrologist (Medical): No    Lack of Transportation (Non-Medical): No  Physical Activity: Inactive (06/28/2021)   Exercise Vital Sign    Days of Exercise per Week: 0 days    Minutes of Exercise per Session: 0 min  Stress: No Stress Concern Present (06/28/2021)   [place]    Feeling of Stress : Not at all  Social Connections: Not on file  Intimate Partner Violence: Not on file    FAMILY HISTORY: Family History  Problem Relation Age of Onset   Colon cancer Brother 72       exposure to chemicals [place]   Hypertension Mother    Stroke Mother    Kidney failure Father    Breast cancer Neg Hx    Ovarian cancer Neg Hx     ALLERGIES:  is allergic to sulfamethoxazole-trimethoprim.  MEDICATIONS:  Current Outpatient Medications  Medication Sig Dispense Refill   Cholecalciferol (VITAMIN D3) 2000 units capsule Take 2,000 Units by mouth daily.     diclofenac sodium (VOLTAREN) 1 % GEL Apply 2 g topically 4 (four) times daily as needed (joint pain).  11   diphenoxylate-atropine (LOMOTIL) 2.5-0.025 MG tablet Take 1 tablet by mouth 4 (four) times daily as needed for diarrhea or loose stools. 30 tablet 1   ELIQUIS 2.5 MG TABS tablet TAKE 1 TABLET BY MOUTH TWICE  DAILY 60 tablet 11   fluticasone (FLONASE) 50 MCG/ACT nasal spray USE 1 SPRAY IN EACH NOSTRIL ONCE DAILY 16 g 3   gabapentin (NEURONTIN) 100 MG capsule Take 1 capsule (100 mg total) by mouth at bedtime. 30 capsule 0   ipratropium (ATROVENT) 0.03 % nasal spray Place 1 spray into both nostrils 2 (two) times daily. 30 mL 2   lidocaine-prilocaine (EMLA) cream Apply 1 application. topically as needed. 30 g 6   loperamide (IMODIUM) 2 MG capsule Take 1 capsule (2 mg total) by mouth See admin instructions. With onset of loose stool, take 10m followed by 242mevery 2 hours,  Maximum: 16 mg/day 120 capsule 1   loratadine (CLARITIN) 10 MG tablet  Take 10 mg by mouth daily.     Multiple Vitamins-Minerals (ONE-A-DAY WOMENS 50 PLUS PO) Take 1 tablet by mouth daily.      potassium chloride SA (KLOR-CON M) 20 MEQ tablet Take 2 tablets (40 mEq total) by mouth daily. 60 tablet 5   simvastatin (ZOCOR) 40 MG tablet TAKE 1 TABLET BY MOUTH AT  BEDTIME 90 tablet 1   triamterene-hydrochlorothiazide (DYAZIDE) 37.5-25 MG capsule TAKE 1 CAPSULE BY MOUTH  DAILY 90 capsule 1   zinc gluconate 50 MG tablet Take 50 mg by mouth daily.     cholestyramine (QUESTRAN) 4 g packet Take 1 packet (4 g total) by mouth 3 (three) times daily. (Patient not taking: Reported on 06/12/2022) 90 each 1   No current facility-administered medications for this visit.   Facility-Administered Medications Ordered in Other Visits  Medication Dose Route Frequency Provider Last Rate Last Admin   diphenhydrAMINE (BENADRYL) injection 25 mg  25 mg Intravenous Daily PRN YuEarlie ServerMD       fluorouracil (ADRUCIL) 4,300  mg in sodium chloride 0.9 % 64 mL chemo infusion  2,400 mg/m2 (Treatment Plan Recorded) Intravenous 1 day or 1 dose Jacqueline Server, MD       irinotecan (CAMPTOSAR) 180 mg in sodium chloride 0.9 % 500 mL chemo infusion  100 mg/m2 (Treatment Plan Recorded) Intravenous Once Jacqueline Server, MD 339 mL/hr at 06/12/22 1134 180 mg at 06/12/22 1134   leucovorin 750 mg in sodium chloride 0.9 % 250 mL infusion  750 mg Intravenous Once Jacqueline Server, MD 192 mL/hr at 06/12/22 1131 750 mg at 06/12/22 1131     PHYSICAL EXAMINATION: ECOG PERFORMANCE STATUS: 1 - Symptomatic but completely ambulatory  Physical Exam Constitutional:      General: She is not in acute distress. HENT:     Head: Normocephalic and atraumatic.  Eyes:     General: No scleral icterus. Cardiovascular:     Rate and Rhythm: Normal rate and regular rhythm.     Heart sounds: Normal heart sounds.  Pulmonary:     Effort: Pulmonary effort is normal. No respiratory distress.     Breath sounds: No wheezing.  Abdominal:     General:  Bowel sounds are normal. There is no distension.     Palpations: Abdomen is soft.     Comments: + Colostomy bag   Musculoskeletal:        General: No deformity. Normal range of motion.     Cervical back: Normal range of motion and neck supple.     Comments: Right lower extremity subcutaneous cystic nodule, mobile, non tender  Skin:    General: Skin is warm and dry.     Findings: No erythema or rash.  Neurological:     Mental Status: She is alert and oriented to person, place, and time. Mental status is at baseline.     Cranial Nerves: No cranial nerve deficit.     Coordination: Coordination normal.       LABORATORY DATA:  I have reviewed the data as listed Lab Results  Component Value Date   WBC 5.8 06/12/2022   HGB 11.7 (L) 06/12/2022   HCT 37.1 06/12/2022   MCV 96.4 06/12/2022   PLT 210 06/12/2022   Recent Labs    05/15/22 0847 05/29/22 0837 06/12/22 0843  NA 138 138 138  K 4.3 4.1 4.1  CL 103 104 105  CO2 '27 27 28  ' GLUCOSE 99 98 97  BUN '9 12 12  ' CREATININE 0.77 1.02* 0.84  CALCIUM 9.1 9.4 9.1  GFRNONAA >60 57* >60  PROT 6.6 7.1 6.6  ALBUMIN 4.1 4.1 3.9  AST '29 29 26  ' ALT '16 16 16  ' ALKPHOS 69 74 78  BILITOT 0.2* 0.3 0.5    Iron/TIBC/Ferritin/ %Sat    Component Value Date/Time   IRON 40 06/23/2020 1124   TIBC 246 (L) 06/23/2020 1124   FERRITIN 338 (H) 06/23/2020 1124   IRONPCTSAT 16 06/23/2020 1124     RADIOGRAPHIC STUDIES: I have personally reviewed the radiological images as listed and agreed with the findings in the report. CT CHEST ABDOMEN PELVIS W CONTRAST  Result Date: 04/26/2022 CLINICAL DATA:  Patient with history of rectal carcinoma. On chemotherapy. * Tracking Code: BO * EXAM: CT CHEST, ABDOMEN, AND PELVIS WITH CONTRAST TECHNIQUE: Multidetector CT imaging of the chest, abdomen and pelvis was performed following the standard protocol during bolus administration of intravenous contrast. RADIATION DOSE REDUCTION: This exam was performed  according to the departmental dose-optimization program which includes automated exposure control, adjustment of the  mA and/or kV according to patient size and/or use of iterative reconstruction technique. CONTRAST:  131m OMNIPAQUE IOHEXOL 300 MG/ML  SOLN COMPARISON:  CT C AP January 20, 2022 FINDINGS: CT CHEST FINDINGS Cardiovascular: Right anterior chest wall Port-A-Cath is present with tip terminating in the superior vena cava. Normal heart size. Trace fluid superior pericardial recess. Thoracic aortic vascular calcifications. Mediastinum/Nodes: No enlarged axillary, mediastinal or hilar lymphadenopathy. Normal appearance of the esophagus. Lungs/Pleura: Central airways are patent. Dependent atelectasis within the lower lobes bilaterally. Stable 4 mm left upper lobe nodule (image 47; series 3). Stable 3 mm left upper lobe nodule (image 28; series 3). Biapical pleuroparenchymal thickening/scarring. Mild interval increase in size of 3 mm right upper lobe nodule (image 41; series 3), previously 2 mm. Interval increase in size of 8 mm right middle lobe nodule (image 104; series 3), previously 5 mm. Slight interval increase in size of 7 mm right middle lobe nodule (image 100; series 3), previously 5 mm. No pleural effusion or pneumothorax. Musculoskeletal: No aggressive or acute appearing osseous lesions. CT ABDOMEN PELVIS FINDINGS Hepatobiliary: Liver is normal in size and contour. No focal hepatic lesion identified. Stable biliary ductal dilatation. Pancreas: Unremarkable Spleen: Unremarkable Adrenals/Urinary Tract: Normal adrenal glands. Kidneys enhance symmetrically with contrast. Bilateral renal cysts redemonstrated. No hydronephrosis. Urinary bladder is unremarkable. Stomach/Bowel: Patient is status post APR with left colostomy. No evidence for bowel obstruction. No free fluid or free intraperitoneal air. Normal morphology the stomach. Redemonstrated pelvic floor laxity and associated bulging of small bowel.  Vascular/Lymphatic: Normal caliber abdominal aorta. Peripheral calcified atherosclerotic plaque. No retroperitoneal lymphadenopathy. Reproductive: Prior hysterectomy.  Pelvic masses. Other: None. Musculoskeletal: No aggressive or acute appearing osseous lesions. Lumbar spine degenerative changes. IMPRESSION: 1. Slight interval increase in size of a few of the scattered pulmonary nodules. Other nodules are stable. 2. Similar biliary ductal dilatation. 3. Prior APR with left colostomy, similar to prior. Electronically Signed   By: DLovey NewcomerM.D.   On: 04/26/2022 17:23      CT CHEST ABDOMEN PELVIS W CONTRAST  Result Date: 04/26/2022 CLINICAL DATA:  Patient with history of rectal carcinoma. On chemotherapy. * Tracking Code: BO * EXAM: CT CHEST, ABDOMEN, AND PELVIS WITH CONTRAST TECHNIQUE: Multidetector CT imaging of the chest, abdomen and pelvis was performed following the standard protocol during bolus administration of intravenous contrast. RADIATION DOSE REDUCTION: This exam was performed according to the departmental dose-optimization program which includes automated exposure control, adjustment of the mA and/or kV according to patient size and/or use of iterative reconstruction technique. CONTRAST:  1048mOMNIPAQUE IOHEXOL 300 MG/ML  SOLN COMPARISON:  CT C AP January 20, 2022 FINDINGS: CT CHEST FINDINGS Cardiovascular: Right anterior chest wall Port-A-Cath is present with tip terminating in the superior vena cava. Normal heart size. Trace fluid superior pericardial recess. Thoracic aortic vascular calcifications. Mediastinum/Nodes: No enlarged axillary, mediastinal or hilar lymphadenopathy. Normal appearance of the esophagus. Lungs/Pleura: Central airways are patent. Dependent atelectasis within the lower lobes bilaterally. Stable 4 mm left upper lobe nodule (image 47; series 3). Stable 3 mm left upper lobe nodule (image 28; series 3). Biapical pleuroparenchymal thickening/scarring. Mild interval increase in  size of 3 mm right upper lobe nodule (image 41; series 3), previously 2 mm. Interval increase in size of 8 mm right middle lobe nodule (image 104; series 3), previously 5 mm. Slight interval increase in size of 7 mm right middle lobe nodule (image 100; series 3), previously 5 mm. No pleural effusion or pneumothorax. Musculoskeletal: No aggressive  or acute appearing osseous lesions. CT ABDOMEN PELVIS FINDINGS Hepatobiliary: Liver is normal in size and contour. No focal hepatic lesion identified. Stable biliary ductal dilatation. Pancreas: Unremarkable Spleen: Unremarkable Adrenals/Urinary Tract: Normal adrenal glands. Kidneys enhance symmetrically with contrast. Bilateral renal cysts redemonstrated. No hydronephrosis. Urinary bladder is unremarkable. Stomach/Bowel: Patient is status post APR with left colostomy. No evidence for bowel obstruction. No free fluid or free intraperitoneal air. Normal morphology the stomach. Redemonstrated pelvic floor laxity and associated bulging of small bowel. Vascular/Lymphatic: Normal caliber abdominal aorta. Peripheral calcified atherosclerotic plaque. No retroperitoneal lymphadenopathy. Reproductive: Prior hysterectomy.  Pelvic masses. Other: None. Musculoskeletal: No aggressive or acute appearing osseous lesions. Lumbar spine degenerative changes. IMPRESSION: 1. Slight interval increase in size of a few of the scattered pulmonary nodules. Other nodules are stable. 2. Similar biliary ductal dilatation. 3. Prior APR with left colostomy, similar to prior. Electronically Signed   By: Lovey Newcomer M.D.   On: 04/26/2022 17:23

## 2022-06-12 NOTE — Assessment & Plan Note (Signed)
Continue  Eliquis 2.5 mg twice daily for anticoagulation prophylaxis. 

## 2022-06-13 ENCOUNTER — Encounter: Payer: Self-pay | Admitting: Oncology

## 2022-06-13 LAB — CEA: CEA: 1734 ng/mL — ABNORMAL HIGH (ref 0.0–4.7)

## 2022-06-13 NOTE — Addendum Note (Signed)
Addended by: Earlie Server on: 06/13/2022 10:11 PM   Modules accepted: Orders

## 2022-06-14 ENCOUNTER — Inpatient Hospital Stay: Payer: Medicare Other

## 2022-06-14 VITALS — BP 125/68 | HR 69 | Temp 98.6°F | Resp 18

## 2022-06-14 DIAGNOSIS — K838 Other specified diseases of biliary tract: Secondary | ICD-10-CM | POA: Diagnosis not present

## 2022-06-14 DIAGNOSIS — K219 Gastro-esophageal reflux disease without esophagitis: Secondary | ICD-10-CM | POA: Diagnosis not present

## 2022-06-14 DIAGNOSIS — M47816 Spondylosis without myelopathy or radiculopathy, lumbar region: Secondary | ICD-10-CM | POA: Diagnosis not present

## 2022-06-14 DIAGNOSIS — C799 Secondary malignant neoplasm of unspecified site: Secondary | ICD-10-CM

## 2022-06-14 DIAGNOSIS — C20 Malignant neoplasm of rectum: Secondary | ICD-10-CM | POA: Diagnosis not present

## 2022-06-14 DIAGNOSIS — Z5111 Encounter for antineoplastic chemotherapy: Secondary | ICD-10-CM | POA: Diagnosis not present

## 2022-06-14 DIAGNOSIS — Z933 Colostomy status: Secondary | ICD-10-CM | POA: Diagnosis not present

## 2022-06-14 DIAGNOSIS — Z86711 Personal history of pulmonary embolism: Secondary | ICD-10-CM | POA: Diagnosis not present

## 2022-06-14 DIAGNOSIS — E7800 Pure hypercholesterolemia, unspecified: Secondary | ICD-10-CM | POA: Diagnosis not present

## 2022-06-14 DIAGNOSIS — Z87891 Personal history of nicotine dependence: Secondary | ICD-10-CM | POA: Diagnosis not present

## 2022-06-14 DIAGNOSIS — D701 Agranulocytosis secondary to cancer chemotherapy: Secondary | ICD-10-CM | POA: Diagnosis not present

## 2022-06-14 DIAGNOSIS — R634 Abnormal weight loss: Secondary | ICD-10-CM | POA: Diagnosis not present

## 2022-06-14 DIAGNOSIS — T451X5A Adverse effect of antineoplastic and immunosuppressive drugs, initial encounter: Secondary | ICD-10-CM | POA: Diagnosis not present

## 2022-06-14 DIAGNOSIS — D6481 Anemia due to antineoplastic chemotherapy: Secondary | ICD-10-CM | POA: Diagnosis not present

## 2022-06-14 DIAGNOSIS — C7800 Secondary malignant neoplasm of unspecified lung: Secondary | ICD-10-CM | POA: Diagnosis not present

## 2022-06-14 DIAGNOSIS — Z800 Family history of malignant neoplasm of digestive organs: Secondary | ICD-10-CM | POA: Diagnosis not present

## 2022-06-14 DIAGNOSIS — I10 Essential (primary) hypertension: Secondary | ICD-10-CM | POA: Diagnosis not present

## 2022-06-14 DIAGNOSIS — Z5189 Encounter for other specified aftercare: Secondary | ICD-10-CM | POA: Diagnosis not present

## 2022-06-14 DIAGNOSIS — Z79899 Other long term (current) drug therapy: Secondary | ICD-10-CM | POA: Diagnosis not present

## 2022-06-14 MED ORDER — HEPARIN SOD (PORK) LOCK FLUSH 100 UNIT/ML IV SOLN
500.0000 [IU] | Freq: Once | INTRAVENOUS | Status: AC | PRN
  Administered 2022-06-14: 500 [IU]
  Filled 2022-06-14: qty 5

## 2022-06-14 MED ORDER — SODIUM CHLORIDE 0.9% FLUSH
10.0000 mL | INTRAVENOUS | Status: DC | PRN
  Administered 2022-06-14: 10 mL
  Filled 2022-06-14: qty 10

## 2022-06-14 MED ORDER — PEGFILGRASTIM INJECTION 6 MG/0.6ML ~~LOC~~
6.0000 mg | PREFILLED_SYRINGE | Freq: Once | SUBCUTANEOUS | Status: AC
  Administered 2022-06-14: 6 mg via SUBCUTANEOUS
  Filled 2022-06-14: qty 0.6

## 2022-06-23 MED FILL — Dexamethasone Sodium Phosphate Inj 100 MG/10ML: INTRAMUSCULAR | Qty: 1 | Status: AC

## 2022-06-24 ENCOUNTER — Other Ambulatory Visit: Payer: Self-pay | Admitting: Oncology

## 2022-06-24 DIAGNOSIS — E876 Hypokalemia: Secondary | ICD-10-CM

## 2022-06-26 ENCOUNTER — Inpatient Hospital Stay: Payer: Medicare Other | Attending: Oncology | Admitting: Oncology

## 2022-06-26 ENCOUNTER — Encounter: Payer: Self-pay | Admitting: Oncology

## 2022-06-26 ENCOUNTER — Inpatient Hospital Stay: Payer: Medicare Other

## 2022-06-26 VITALS — BP 134/61 | HR 77 | Temp 96.6°F | Resp 20 | Wt 158.0 lb

## 2022-06-26 DIAGNOSIS — Z86718 Personal history of other venous thrombosis and embolism: Secondary | ICD-10-CM | POA: Insufficient documentation

## 2022-06-26 DIAGNOSIS — C20 Malignant neoplasm of rectum: Secondary | ICD-10-CM | POA: Insufficient documentation

## 2022-06-26 DIAGNOSIS — C799 Secondary malignant neoplasm of unspecified site: Secondary | ICD-10-CM

## 2022-06-26 DIAGNOSIS — D6481 Anemia due to antineoplastic chemotherapy: Secondary | ICD-10-CM | POA: Diagnosis not present

## 2022-06-26 DIAGNOSIS — K219 Gastro-esophageal reflux disease without esophagitis: Secondary | ICD-10-CM | POA: Diagnosis not present

## 2022-06-26 DIAGNOSIS — D701 Agranulocytosis secondary to cancer chemotherapy: Secondary | ICD-10-CM | POA: Diagnosis not present

## 2022-06-26 DIAGNOSIS — T451X5A Adverse effect of antineoplastic and immunosuppressive drugs, initial encounter: Secondary | ICD-10-CM | POA: Diagnosis not present

## 2022-06-26 DIAGNOSIS — R634 Abnormal weight loss: Secondary | ICD-10-CM | POA: Insufficient documentation

## 2022-06-26 DIAGNOSIS — E876 Hypokalemia: Secondary | ICD-10-CM | POA: Diagnosis not present

## 2022-06-26 DIAGNOSIS — Z5111 Encounter for antineoplastic chemotherapy: Secondary | ICD-10-CM | POA: Insufficient documentation

## 2022-06-26 DIAGNOSIS — I2510 Atherosclerotic heart disease of native coronary artery without angina pectoris: Secondary | ICD-10-CM | POA: Diagnosis not present

## 2022-06-26 DIAGNOSIS — Z5189 Encounter for other specified aftercare: Secondary | ICD-10-CM | POA: Diagnosis not present

## 2022-06-26 DIAGNOSIS — K5900 Constipation, unspecified: Secondary | ICD-10-CM | POA: Diagnosis not present

## 2022-06-26 DIAGNOSIS — Z933 Colostomy status: Secondary | ICD-10-CM | POA: Insufficient documentation

## 2022-06-26 DIAGNOSIS — M25552 Pain in left hip: Secondary | ICD-10-CM | POA: Diagnosis not present

## 2022-06-26 DIAGNOSIS — R1900 Intra-abdominal and pelvic swelling, mass and lump, unspecified site: Secondary | ICD-10-CM | POA: Insufficient documentation

## 2022-06-26 DIAGNOSIS — M47816 Spondylosis without myelopathy or radiculopathy, lumbar region: Secondary | ICD-10-CM | POA: Insufficient documentation

## 2022-06-26 DIAGNOSIS — K435 Parastomal hernia without obstruction or  gangrene: Secondary | ICD-10-CM | POA: Diagnosis not present

## 2022-06-26 DIAGNOSIS — K838 Other specified diseases of biliary tract: Secondary | ICD-10-CM | POA: Insufficient documentation

## 2022-06-26 DIAGNOSIS — Z86711 Personal history of pulmonary embolism: Secondary | ICD-10-CM | POA: Diagnosis not present

## 2022-06-26 DIAGNOSIS — Z7952 Long term (current) use of systemic steroids: Secondary | ICD-10-CM | POA: Insufficient documentation

## 2022-06-26 DIAGNOSIS — Z79899 Other long term (current) drug therapy: Secondary | ICD-10-CM | POA: Insufficient documentation

## 2022-06-26 DIAGNOSIS — C7800 Secondary malignant neoplasm of unspecified lung: Secondary | ICD-10-CM | POA: Diagnosis not present

## 2022-06-26 DIAGNOSIS — N281 Cyst of kidney, acquired: Secondary | ICD-10-CM | POA: Diagnosis not present

## 2022-06-26 DIAGNOSIS — I10 Essential (primary) hypertension: Secondary | ICD-10-CM | POA: Insufficient documentation

## 2022-06-26 DIAGNOSIS — E7800 Pure hypercholesterolemia, unspecified: Secondary | ICD-10-CM | POA: Diagnosis not present

## 2022-06-26 LAB — CBC WITH DIFFERENTIAL/PLATELET
Abs Immature Granulocytes: 0.05 10*3/uL (ref 0.00–0.07)
Basophils Absolute: 0 10*3/uL (ref 0.0–0.1)
Basophils Relative: 1 %
Eosinophils Absolute: 0.1 10*3/uL (ref 0.0–0.5)
Eosinophils Relative: 2 %
HCT: 38.4 % (ref 36.0–46.0)
Hemoglobin: 12.1 g/dL (ref 12.0–15.0)
Immature Granulocytes: 1 %
Lymphocytes Relative: 21 %
Lymphs Abs: 1.4 10*3/uL (ref 0.7–4.0)
MCH: 30.4 pg (ref 26.0–34.0)
MCHC: 31.5 g/dL (ref 30.0–36.0)
MCV: 96.5 fL (ref 80.0–100.0)
Monocytes Absolute: 0.6 10*3/uL (ref 0.1–1.0)
Monocytes Relative: 9 %
Neutro Abs: 4.5 10*3/uL (ref 1.7–7.7)
Neutrophils Relative %: 66 %
Platelets: 210 10*3/uL (ref 150–400)
RBC: 3.98 MIL/uL (ref 3.87–5.11)
RDW: 17.5 % — ABNORMAL HIGH (ref 11.5–15.5)
WBC: 6.7 10*3/uL (ref 4.0–10.5)
nRBC: 0 % (ref 0.0–0.2)

## 2022-06-26 LAB — COMPREHENSIVE METABOLIC PANEL
ALT: 15 U/L (ref 0–44)
AST: 28 U/L (ref 15–41)
Albumin: 3.9 g/dL (ref 3.5–5.0)
Alkaline Phosphatase: 73 U/L (ref 38–126)
Anion gap: 6 (ref 5–15)
BUN: 14 mg/dL (ref 8–23)
CO2: 27 mmol/L (ref 22–32)
Calcium: 9.3 mg/dL (ref 8.9–10.3)
Chloride: 106 mmol/L (ref 98–111)
Creatinine, Ser: 0.83 mg/dL (ref 0.44–1.00)
GFR, Estimated: >60 mL/min (ref >60)
Glucose, Bld: 97 mg/dL (ref 70–99)
Potassium: 4.2 mmol/L (ref 3.5–5.1)
Sodium: 139 mmol/L (ref 135–145)
Total Bilirubin: 0.4 mg/dL (ref 0.3–1.2)
Total Protein: 6.7 g/dL (ref 6.5–8.1)

## 2022-06-26 LAB — PROTEIN, URINE, RANDOM: Total Protein, Urine: 19 mg/dL

## 2022-06-26 MED ORDER — ACETAMINOPHEN 325 MG PO TABS
650.0000 mg | ORAL_TABLET | Freq: Once | ORAL | Status: AC
  Administered 2022-06-26: 650 mg via ORAL
  Filled 2022-06-26: qty 2

## 2022-06-26 MED ORDER — SODIUM CHLORIDE 0.9 % IV SOLN
5.0000 mg/kg | Freq: Once | INTRAVENOUS | Status: AC
  Administered 2022-06-26: 350 mg via INTRAVENOUS
  Filled 2022-06-26: qty 14

## 2022-06-26 MED ORDER — ATROPINE SULFATE 1 MG/ML IV SOLN
0.5000 mg | Freq: Once | INTRAVENOUS | Status: AC | PRN
  Administered 2022-06-26: 0.5 mg via INTRAVENOUS
  Filled 2022-06-26: qty 1

## 2022-06-26 MED ORDER — SODIUM CHLORIDE 0.9 % IV SOLN
2400.0000 mg/m2 | INTRAVENOUS | Status: DC
  Administered 2022-06-26: 4300 mg via INTRAVENOUS
  Filled 2022-06-26: qty 86

## 2022-06-26 MED ORDER — SODIUM CHLORIDE 0.9 % IV SOLN
10.0000 mg | Freq: Once | INTRAVENOUS | Status: AC
  Administered 2022-06-26: 10 mg via INTRAVENOUS
  Filled 2022-06-26: qty 10

## 2022-06-26 MED ORDER — SODIUM CHLORIDE 0.9 % IV SOLN
100.0000 mg/m2 | Freq: Once | INTRAVENOUS | Status: DC
  Administered 2022-06-26: 180 mg via INTRAVENOUS
  Filled 2022-06-26: qty 5

## 2022-06-26 MED ORDER — SODIUM CHLORIDE 0.9 % IV SOLN
Freq: Once | INTRAVENOUS | Status: AC
  Filled 2022-06-26: qty 250

## 2022-06-26 MED ORDER — DIPHENHYDRAMINE HCL 50 MG/ML IJ SOLN
25.0000 mg | Freq: Every day | INTRAMUSCULAR | Status: DC | PRN
  Administered 2022-06-26: 25 mg via INTRAVENOUS
  Filled 2022-06-26: qty 1

## 2022-06-26 MED ORDER — PALONOSETRON HCL INJECTION 0.25 MG/5ML
0.2500 mg | Freq: Once | INTRAVENOUS | Status: AC
  Administered 2022-06-26: 0.25 mg via INTRAVENOUS
  Filled 2022-06-26: qty 5

## 2022-06-26 MED ORDER — SODIUM CHLORIDE 0.9 % IV SOLN
417.0000 mg/m2 | Freq: Once | INTRAVENOUS | Status: AC
  Administered 2022-06-26: 750 mg via INTRAVENOUS
  Filled 2022-06-26: qty 37.5

## 2022-06-26 NOTE — Assessment & Plan Note (Signed)
Xray of left hip Recommend Tylenol '650mg'$  Q8h PRN for pain, otc supply

## 2022-06-26 NOTE — Patient Instructions (Signed)
[facility] AT [place]  Discharge Instructions: Thank you for choosing [place] to provide your oncology and hematology care.  If you have a lab appointment with the [facility], please go directly to the [facility] and check in at the registration area.  Wear comfortable clothing and clothing appropriate for easy access to any Portacath or PICC line.   We strive to give you quality time with your provider. You may need to reschedule your appointment if you arrive late (15 or more minutes).  Arriving late affects you and other patients whose appointments are after yours.  Also, if you miss three or more appointments without notifying the office, you may be dismissed from the clinic at the provider's discretion.      For prescription refill requests, have your pharmacy contact our office and allow 72 hours for refills to be completed.    Today you received the following chemotherapy and/or immunotherapy agents Irinotecan, leucovorin, mvasi, 29f.      To help prevent nausea and vomiting after your treatment, we encourage you to take your nausea medication as directed.  BELOW ARE SYMPTOMS THAT SHOULD BE REPORTED IMMEDIATELY: *FEVER GREATER THAN 100.4 F (38 C) OR HIGHER *CHILLS OR SWEATING *NAUSEA AND VOMITING THAT IS NOT CONTROLLED WITH YOUR NAUSEA MEDICATION *UNUSUAL SHORTNESS OF BREATH *UNUSUAL BRUISING OR BLEEDING *URINARY PROBLEMS (pain or burning when urinating, or frequent urination) *BOWEL PROBLEMS (unusual diarrhea, constipation, pain near the anus) TENDERNESS IN MOUTH AND THROAT WITH OR WITHOUT PRESENCE OF ULCERS (sore throat, sores in mouth, or a toothache) UNUSUAL RASH, SWELLING OR PAIN  UNUSUAL VAGINAL DISCHARGE OR ITCHING   Items with * indicate a potential emergency and should be followed up as soon as possible or go to the Emergency Department if any problems should occur.  Please show the CHEMOTHERAPY ALERT CARD or IMMUNOTHERAPY  ALERT CARD at check-in to the Emergency Department and triage nurse.  Should you have questions after your visit or need to cancel or reschedule your appointment, [facility] [place] [phone] follow the prompts.  Office hours are 8:00 a.m. to 4:30 p.m. Monday - Friday. Please note that voicemails left after 4:00 p.m. may not be returned until the following business day.  We are closed weekends and major holidays. You have access to a nurse at all times for urgent questions. Please call the main number to the clinic [phone] follow the prompts.  For any non-urgent questions, you may also contact your provider using MyChart. We now offer e-Visits for anyone 135and older to request care online for non-urgent symptoms. For details visit [email or web address]   Also download the MyChart app! Go to the app store, search "MyChart", open the app, select [facility], and log in with your MyChart username and password.  Masks are optional in the cancer centers. If you would like for your care team to wear a mask while they are taking care of you, please let them know. For doctor visits, patients may have with them one support person who is at least 75years old. At this time, visitors are not allowed in the infusion area.  Irinotecan Injection What is this medication? IRINOTECAN (ir in oh TEE kan) treats some types of cancer. It works by slowing down the growth of cancer cells. This medicine may be used for other purposes; ask your health care provider or pharmacist if you have questions. COMMON BRAND NAME(S): Camptosar What should I  tell my care team before I take this medication? They need to know if you have any of these conditions: Dehydration Diarrhea Infection, especially a viral infection, such as chickenpox, cold sores, herpes Liver disease Low blood cell levels (white cells, red cells, and platelets) Low levels of electrolytes, such as  calcium, magnesium, or potassium in your blood Recent or ongoing radiation An unusual or allergic reaction to irinotecan, other medications, foods, dyes, or preservatives If you or your partner are pregnant or trying to get pregnant Breast-feeding How should I use this medication? This medication is injected into a vein. It is given by your care team in a hospital or clinic setting. Talk to your care team about the use of this medication in children. Special care may be needed. Overdosage: If you think you have taken too much of this medicine contact a poison control center or emergency room at once. NOTE: This medicine is only for you. Do not share this medicine with others. What if I miss a dose? Keep appointments for follow-up doses. It is important not to miss your dose. Call your care team if you are unable to keep an appointment. What may interact with this medication? Do not take this medication with any of the following: Cobicistat Itraconazole This medication may also interact with the following: Certain antibiotics, such as clarithromycin, rifampin, rifabutin Certain antivirals for HIV or AIDS Certain medications for fungal infections, such as ketoconazole, posaconazole, voriconazole Certain medications for seizures, such as carbamazepine, phenobarbital, phenytoin Gemfibrozil Nefazodone St. John's wort This list may not describe all possible interactions. Give your health care provider a list of all the medicines, herbs, non-prescription drugs, or dietary supplements you use. Also tell them if you smoke, drink alcohol, or use illegal drugs. Some items may interact with your medicine. What should I watch for while using this medication? Your condition will be monitored carefully while you are receiving this medication. You may need blood work while taking this medication. This medication may make you feel generally unwell. This is not uncommon as chemotherapy can affect healthy  cells as well as cancer cells. Report any side effects. Continue your course of treatment even though you feel ill unless your care team tells you to stop. This medication can cause serious side effects. To reduce the risk, your care team may give you other medications to take before receiving this one. Be sure to follow the directions from your care team. This medication may affect your coordination, reaction time, or judgement. Do not drive or operate machinery until you know how this medication affects you. Sit up or stand slowly to reduce the risk of dizzy or fainting spells. Drinking alcohol with this medication can increase the risk of these side effects. This medication may increase your risk of getting an infection. Call your care team for advice if you get a fever, chills, sore throat, or other symptoms of a cold or flu. Do not treat yourself. Try to avoid being around people who are sick. Avoid taking medications that contain aspirin, acetaminophen, ibuprofen, naproxen, or ketoprofen unless instructed by your care team. These medications may hide a fever. This medication may increase your risk to bruise or bleed. Call your care team if you notice any unusual bleeding. Be careful brushing or flossing your teeth or using a toothpick because you may get an infection or bleed more easily. If you have any dental work done, tell your dentist you are receiving this medication. Talk to your care team  if you or your partner are pregnant or think either of you might be pregnant. This medication can cause serious birth defects if taken during pregnancy and for 6 months after the last dose. You will need a negative pregnancy test before starting this medication. Contraception is recommended while taking this medication and for 6 months after the last dose. Your care team can help you find the option that works for you. Do not father a child while taking this medication and for 3 months after the last dose. Use  a condom for contraception during this time period. Do not breastfeed while taking this medication and for 7 days after the last dose. This medication may cause infertility. Talk to your care team if you are concerned about your fertility. What side effects may I notice from receiving this medication? Side effects that you should report to your care team as soon as possible: Allergic reactions--skin rash, itching, hives, swelling of the face, lips, tongue, or throat Dry cough, shortness of breath or trouble breathing Increased saliva or tears, increased sweating, stomach cramping, diarrhea, small pupils, unusual weakness or fatigue, slow heartbeat Infection--fever, chills, cough, sore throat, wounds that don't heal, pain or trouble when passing urine, general feeling of discomfort or being unwell Kidney injury--decrease in the amount of urine, swelling of the ankles, hands, or feet Low red blood cell level--unusual weakness or fatigue, dizziness, headache, trouble breathing Severe or prolonged diarrhea Unusual bruising or bleeding Side effects that usually do not require medical attention (report to your care team if they continue or are bothersome): Constipation Diarrhea Hair loss Loss of appetite Nausea Stomach pain This list may not describe all possible side effects. Call your doctor for medical advice about side effects. You may report side effects to FDA at 1-800-FDA-1088. Where should I keep my medication? This medication is given in a hospital or clinic. It will not be stored at home. NOTE: This sheet is a summary. It may not cover all possible information. If you have questions about this medicine, talk to your doctor, pharmacist, or health care provider.  2023 Elsevier/Gold Standard (2022-02-09 00:00:00)  Leucovorin Injection What is this medication? LEUCOVORIN (loo koe VOR in) prevents side effects from certain medications, such as methotrexate. It works by increasing folate  levels. This helps protect healthy cells in your body. It may also be used to treat anemia caused by low levels of folate. It can also be used with fluorouracil, a type of chemotherapy, to treat colorectal cancer. It works by increasing the effects of fluorouracil in the body. This medicine may be used for other purposes; ask your health care provider or pharmacist if you have questions. What should I tell my care team before I take this medication? They need to know if you have any of these conditions: Anemia from low levels of vitamin B12 in the blood An unusual or allergic reaction to leucovorin, folic acid, other medications, foods, dyes, or preservatives Pregnant or trying to get pregnant Breastfeeding How should I use this medication? This medication is injected into a vein or a muscle. It is given by your care team in a hospital or clinic setting. Talk to your care team about the use of this medication in children. Special care may be needed. Overdosage: If you think you have taken too much of this medicine contact a poison control center or emergency room at once. NOTE: This medicine is only for you. Do not share this medicine with others. What if I  miss a dose? Keep appointments for follow-up doses. It is important not to miss your dose. Call your care team if you are unable to keep an appointment. What may interact with this medication? Capecitabine Fluorouracil Phenobarbital Phenytoin Primidone Trimethoprim;sulfamethoxazole This list may not describe all possible interactions. Give your health care provider a list of all the medicines, herbs, non-prescription drugs, or dietary supplements you use. Also tell them if you smoke, drink alcohol, or use illegal drugs. Some items may interact with your medicine. What should I watch for while using this medication? Your condition will be monitored carefully while you are receiving this medication. This medication may increase the side  effects of 5-fluorouracil. Tell your care team if you have diarrhea or mouth sores that do not get better or that get worse. What side effects may I notice from receiving this medication? Side effects that you should report to your care team as soon as possible: Allergic reactions--skin rash, itching, hives, swelling of the face, lips, tongue, or throat This list may not describe all possible side effects. Call your doctor for medical advice about side effects. You may report side effects to FDA at 1-800-FDA-1088. Where should I keep my medication? This medication is given in a hospital or clinic. It will not be stored at home. NOTE: This sheet is a summary. It may not cover all possible information. If you have questions about this medicine, talk to your doctor, pharmacist, or health care provider.  2023 Elsevier/Gold Standard (2022-02-10 00:00:00)  Bevacizumab Injection What is this medication? BEVACIZUMAB (be va SIZ yoo mab) treats some types of cancer. It works by blocking a protein that causes cancer cells to grow and multiply. This helps to slow or stop the spread of cancer cells. It is a monoclonal antibody. This medicine may be used for other purposes; ask your health care provider or pharmacist if you have questions. COMMON BRAND NAME(S): Alymsys, Avastin, MVASI, Noah Charon What should I tell my care team before I take this medication? They need to know if you have any of these conditions: Blood clots Coughing up blood Having or recent surgery Heart failure High blood pressure History of a connection between 2 or more body parts that do not usually connect (fistula) History of a tear in your stomach or intestines Protein in your urine An unusual or allergic reaction to bevacizumab, other medications, foods, dyes, or preservatives Pregnant or trying to get pregnant Breast-feeding How should I use this medication? This medication is injected into a vein. It is given by your care  team in a hospital or clinic setting. Talk to your care team the use of this medication in children. Special care may be needed. Overdosage: If you think you have taken too much of this medicine contact a poison control center or emergency room at once. NOTE: This medicine is only for you. Do not share this medicine with others. What if I miss a dose? Keep appointments for follow-up doses. It is important not to miss your dose. Call your care team if you are unable to keep an appointment. What may interact with this medication? Interactions are not expected. This list may not describe all possible interactions. Give your health care provider a list of all the medicines, herbs, non-prescription drugs, or dietary supplements you use. Also tell them if you smoke, drink alcohol, or use illegal drugs. Some items may interact with your medicine. What should I watch for while using this medication? Your condition will be monitored carefully  while you are receiving this medication. You may need blood work while taking this medication. This medication may make you feel generally unwell. This is not uncommon as chemotherapy can affect healthy cells as well as cancer cells. Report any side effects. Continue your course of treatment even though you feel ill unless your care team tells you to stop. This medication may increase your risk to bruise or bleed. Call your care team if you notice any unusual bleeding. Before having surgery, talk to your care team to make sure it is ok. This medication can increase the risk of poor healing of your surgical site or wound. You will need to stop this medication for 28 days before surgery. After surgery, wait at least 28 days before restarting this medication. Make sure the surgical site or wound is healed enough before restarting this medication. Talk to your care team if questions. Talk to your care team if you may be pregnant. Serious birth defects can occur if you take  this medication during pregnancy and for 6 months after the last dose. Contraception is recommended while taking this medication and for 6 months after the last dose. Your care team can help you find the option that works for you. Do not breastfeed while taking this medication and for 6 months after the last dose. This medication can cause infertility. Talk to your care team if you are concerned about your fertility. What side effects may I notice from receiving this medication? Side effects that you should report to your care team as soon as possible: Allergic reactions--skin rash, itching, hives, swelling of the face, lips, tongue, or throat Bleeding--bloody or black, tar-like stools, vomiting blood or brown material that looks like coffee grounds, red or dark brown urine, small red or purple spots on skin, unusual bruising or bleeding Blood clot--pain, swelling, or warmth in the leg, shortness of breath, chest pain Heart attack--pain or tightness in the chest, shoulders, arms, or jaw, nausea, shortness of breath, cold or clammy skin, feeling faint or lightheaded Heart failure--shortness of breath, swelling of the ankles, feet, or hands, sudden weight gain, unusual weakness or fatigue Increase in blood pressure Infection--fever, chills, cough, sore throat, wounds that don't heal, pain or trouble when passing urine, general feeling of discomfort or being unwell Infusion reactions--chest pain, shortness of breath or trouble breathing, feeling faint or lightheaded Kidney injury--decrease in the amount of urine, swelling of the ankles, hands, or feet Stomach pain that is severe, does not go away, or gets worse Stroke--sudden numbness or weakness of the face, arm, or leg, trouble speaking, confusion, trouble walking, loss of balance or coordination, dizziness, severe headache, change in vision Sudden and severe headache, confusion, change in vision, seizures, which may be signs of posterior reversible  encephalopathy syndrome (PRES) Side effects that usually do not require medical attention (report to your care team if they continue or are bothersome): Back pain Change in taste Diarrhea Dry skin Increased tears Nosebleed This list may not describe all possible side effects. Call your doctor for medical advice about side effects. You may report side effects to FDA at 1-800-FDA-1088. Where should I keep my medication? This medication is given in a hospital or clinic. It will not be stored at home. NOTE: This sheet is a summary. It may not cover all possible information. If you have questions about this medicine, talk to your doctor, pharmacist, or health care provider.  2023 Elsevier/Gold Standard (2022-02-14 00:00:00)  Fluorouracil, 5FU; Diclofenac topical cream What is  this medication? FLUOROURACIL; DICLOFENAC (flure oh YOOR a sil; dye KLOE fen ak) is a combination of a topical chemotherapy agent and non-steroidal anti-inflammatory drug (NSAID). It is used on the skin to treat skin cancer and skin conditions that could become cancer. This medicine may be used for other purposes; ask your health care provider or pharmacist if you have questions. COMMON BRAND NAME(S): FLUORAC What should I tell my care team before I take this medication? They need to know if you have any of these conditions: bleeding problems cigarette smoker DPD enzyme deficiency heart disease high blood pressure if you frequently drink alcohol containing drinks kidney disease liver disease open or infected skin stomach problems swelling or open sores at the treatment site recent or planned coronary artery bypass graft (CABG) surgery an unusual or allergic reaction to fluorouracil, diclofenac, aspirin, other NSAIDs, other medicines, foods, dyes, or preservatives pregnant or trying to get pregnant breast-feeding How should I use this medication? This medicine is only for use on the skin. Follow the directions on  the prescription label. Wash hands before and after use. Wash affected area and gently pat dry. To apply this medicine use a cotton-tipped applicator, or use gloves if applying with fingertips. If applied with unprotected fingertips, it is very important to wash your hands well after you apply this medicine. Avoid applying to the eyes, nose, or mouth. Apply enough medicine to cover the affected area. You can cover the area with a light gauze dressing, but do not use tight or air-tight dressings. Finish the full course prescribed by your doctor or health care professional, even if you think your condition is better. Do not stop taking except on the advice of your doctor or health care professional. Talk to your pediatrician regarding the use of this medicine in children. Special care may be needed. Overdosage: If you think you have taken too much of this medicine contact a poison control center or emergency room at once. NOTE: This medicine is only for you. Do not share this medicine with others. What if I miss a dose? If you miss a dose, apply it as soon as you can. If it is almost time for your next dose, only use that dose. Do not apply extra doses. Contact your doctor or health care professional if you miss more than one dose. What may interact with this medication? Interactions are not expected. Do not use any other skin products without telling your doctor or health care professional. This list may not describe all possible interactions. Give your health care provider a list of all the medicines, herbs, non-prescription drugs, or dietary supplements you use. Also tell them if you smoke, drink alcohol, or use illegal drugs. Some items may interact with your medicine. What should I watch for while using this medication? Visit your doctor or healthcare provider for checks on your progress. You will need to use this medicine for 2 to 6 weeks. This may be longer depending on the condition being treated.  You may not see full healing for another 1 to 2 months after you stop using the medicine. This medicine may cause serious skin reactions. They can happen weeks to months after starting the medicine. Contact your healthcare provider right away if you notice fevers or flu-like symptoms with a rash. The rash may be red or purple and then turn into blisters or peeling of the skin. Or, you might notice a red rash with swelling of the face, lips or lymph nodes in  your neck or under your arms. Treated areas of skin can look unsightly during and for several weeks after treatment with this medicine. This medicine can make you more sensitive to the sun. Keep out of the sun. If you cannot avoid being in the sun, wear protective clothing and use sunscreen. Do not use sun lamps or tanning beds/booths. Serious side effects or death can occur if a pet comes into contact with this drug. Contact a vet right away if a pet touches or licks the drug on your skin or comes into contact with the container. Throw away or wash any items used to apply this drug. Wash your hands after applying the drug. Make sure the drug does not get on clothing, carpet, or furniture. If you cannot avoid skin to skin contact with your pet, ask your health care provider if you can cover the area(s) where you apply this drug. Do not become pregnant while taking this medicine. Women should inform their doctor if they wish to become pregnant or think they might be pregnant. There is a potential for serious side effects to an unborn child. Talk to your healthcare provider or pharmacist for more information. What side effects may I notice from receiving this medication? Side effects that you should report to your doctor or health care professional as soon as possible: allergic reactions like skin rash, itching or hives, swelling of the face, lips, or tongue black or bloody stools, blood in the urine or vomit blurred vision chest pain difficulty  breathing or wheezing rash, fever, and swollen lymph nodes redness, blistering, peeling or loosening of the skin, including inside the mouth severe redness and swelling of normal skin slurred speech or weakness on one side of the body trouble passing urine or change in the amount of urine unexplained weight gain or swelling unusually weak or tired yellowing of eyes or skin Side effects that usually do not require medical attention (report to your doctor or health care professional if they continue or are bothersome): increased sensitivity of the skin to sun and ultraviolet light pain and burning of the affected area scaling or swelling of the affected area skin rash, itching of the affected area tenderness This list may not describe all possible side effects. Call your doctor for medical advice about side effects. You may report side effects to FDA at 1-800-FDA-1088. Where should I keep my medication? Keep out of the reach of children and pets. Store at room temperature between 20 and 25 degrees C (68 and 77 degrees F). Throw away any unused medicine after the expiration date. NOTE: This sheet is a summary. It may not cover all possible information. If you have questions about this medicine, talk to your doctor, pharmacist, or health care provider.  2023 [employer]/Gold Standard (2021-09-02 00:00:00)

## 2022-06-26 NOTE — Telephone Encounter (Signed)
Component Ref Range & Units 08:36 (06/26/22) 2 wk ago (06/12/22) 4 wk ago (05/29/22) 1 mo ago (05/15/22) 1 mo ago (05/01/22) 2 mo ago (04/17/22) 2 mo ago (04/03/22)  Potassium 3.5 - 5.1 mmol/L 4.2  4.1  4.1  4.3

## 2022-06-26 NOTE — Assessment & Plan Note (Signed)
History of stage IIIC Rectal cancer, s/p TNT, followed by 09/17/19 APR/posterior vaginectomy/TAH/BSO/VY-flap, pT4b pN0 with close vaginal margin 0.2 mm.  Uterus and ovaries negative for malignancy. palliative radiation to vaginal recurrence- 01/19/21 recurrence with lung metastasis.-Palliative -FOLFIRI plus bevacizumab.  Irinotecan was dropped in November 2022 due to side effects. Negative for UGT1A1*28 - radiographically stable, rise of CEA-July 2023 CT showed slightly progression of lung metastasis. Labs are reviewed and discussed with patient. Proceed with dose reduced FOLFIRI-[Irinotecan 100 mg/m2] / bevacizumab-currently she tolerates well RAS wild type. Consider switch to irinotecan +panitumumab upon significant radiographic progression .

## 2022-06-26 NOTE — Addendum Note (Signed)
Addended by: Quita Skye on: 06/26/2022 02:28 PM   Modules accepted: Orders

## 2022-06-26 NOTE — Progress Notes (Signed)
Hematology/Oncology Progress note [phone] [phone]      Patient Care Team: Olin Hauser, DO as PCP - General (Family Medicine) Clent Jacks, RN as Registered Nurse Earlie Server, MD as Consulting Physician (Hematology and Oncology) Cammie Sickle, MD as Consulting Physician (Oncology)  ASSESSMENT & PLAN:   Cancer Staging  Rectal cancer [facility]) Staging form: Colon and Rectum, AJCC 8th Edition - Pathologic stage from 10/06/2019: Stage IIC (ypT4b, pN0, cM0) - Signed by Earlie Server, MD on 10/06/2019 - Clinical stage from 06/30/2020: Jacqueline Yoder - Signed by Earlie Server, MD on 04/17/2022 - Pathologic: Stage Unknown (rpTX, pNX, cM1) - Signed by Earlie Server, MD on 01/31/2021   Rectal cancer [facility]) History of stage IIIC Rectal cancer, s/p TNT, followed by 09/17/19 APR/posterior vaginectomy/TAH/BSO/VY-flap, pT4b pN0 with close vaginal margin 0.2 mm.  Uterus and ovaries negative for malignancy. palliative radiation to vaginal recurrence- 01/19/21 recurrence with lung metastasis.-Palliative -FOLFIRI plus bevacizumab.  Irinotecan was dropped in November 2022 due to side effects. Negative for UGT1A1*28 - radiographically stable, rise of CEA-July 2023 CT showed slightly progression of lung metastasis. Labs are reviewed and discussed with patient. Proceed with dose reduced FOLFIRI-[Irinotecan 100 mg/m2] / bevacizumab-currently she tolerates well RAS wild type. Consider switch to irinotecan +panitumumab upon significant radiographic progression .  Encounter for antineoplastic chemotherapy Chemotherapy plan as listed above.   Chemotherapy induced neutropenia (HCC)  patient will receive long-acting GCSF   Ziextenzo prophylaxis on D3  Left hip pain Xray of left hip Recommend Tylenol 646m Q8h PRN for pain, otc supply   Follow up in 2 weeks. Lab  MD next cycle of chemo. Obtain CT in Oct 2023 All questions were answered. The patient knows to call the clinic with any  problems, questions or concerns.  ZEarlie Server MD, PhD [facility] Oncology 06/26/2022        CHIEF COMPLAINTS/REASON FOR VISIT:  Follow up for rectal cancer  HISTORY OF PRESENTING ILLNESS:  Patient initially presented with complaints of postmenopausal bleeding on 08/16/2018.  History of was menopausal vaginal bleeding in 2016 which resulted in cervical polypectomy.  Pathology 02/04/2015 showed cervical polyp, consistent with benign endometrial polyp.  [name] after polypectomy due to anxiety associated with pelvic exams.  pelvic exam on 08/16/2018 reviewed cervical abnormality and from enlarged uterus. Seen by Dr. CMarcelline Mateson 10/29/2018.  Endometrial biopsy and a Pap smear was performed. 10/29/2018 Pap smear showed adenocarcinoma, favor endometrial origin. 10/29/2018 endometrial biopsy showed endometrioid carcinoma, FIGO grade 1.  10/29/2018- TA & TV Ultrasound revealed: Anteverted uterus measuring 8.7 x 5.6 x 6.4 cm without evidence of focal masses.  The endometrium measuring 24.1 mm (thickened) and heterogeneous.  Right and left ovaries not visualized.  No adnexal masses identified.  No free fluid in cul-de-sac.  Patient was seen by Dr. STheora Gianottiin clinic on 11/13/2018.  Cervical exam reveals 2 cm exophytic irregular mass consistent with malignancy.   11/19/2018 CT chest abdomen pelvis with contrast showed thickened endometrium with some irregularity compatible with the provided diagnosis of endometrial malignancy.  There is a mildly prominent left inguinal node 1.4 cm.  Patient was seen by Dr. BFransisca Connorson 11/20/2018 and left groin lymph node biopsy was recommended.  11/26/2018 patient underwent left inguinal lymph node biopsy. Pathology showed metastatic adenocarcinoma consistent with colorectal origin.  CDX 2+.  Case was discussed on tumor board.  Recommend colonoscopy for further evaluation.  Patient reports significant weight loss 30 pounds over the last year.  Chronic  vaginal spotting. Change of bowel habits the past few months.  More constipated.  Family history positive for brother who has colon cancer prostate cancer.  patient has underwent colonoscopy on 12/03/2018 which reviewed a nonobstructing large mass in the rectum.  Also chronic fistula.  Mass was not circumferential.  This was biopsied with a cold forceps for histology.  Pathology came back hyperplastic polyp negative for dysplasia and malignancy. [name], patient underwent flex sigmoidoscopy on 12/06/2018 with rebiopsy of the rectal mass. This time biopsy results came back positive for invasive colorectal adenocarcinoma, moderately differentiated. Immunotherapy for nearly mismatch repair protein (MMR ) was performed.  There is no loss of MMR expression.  low probability of MSI high.   # Seen by [name] for evaluation of resectability for rectal cancer. In addition, she also had a second opinion with [facility] where her endometrial biopsy pathology was changed to  adenocarcinoma, consistent with colorectal primary.   [name]. She has home health that has been assisting with ostomy care  Patient was also evaluated by [facility] oncology.  Recommendation is to proceed with TNT with concurrent chemoradiation followed by neoadjuvant chemotherapy followed by surgical resection. Patient prefers to have treatment done locally with [facility].   # Oncology Treatment:  02/03/2019- 03/19/2019  concurrent Xeloda and radiation.  Xeloda dose 846m /m2 BID - rounded to 16558mBID- on days of radiation. 04/09/2019, started on FOLFOX with bolus early.  Omitted.  07/16/2019 finished 8 cycles of FOLFOX. 09/17/19 APR/posterior vaginectomy/TAH/BSO/VY-flap pT4b pN0 with close vaginal margin 0.2 mm.  Uterus and ovaries negative for malignancy. Patient reports bilateral lower extremity numbness and tingling, intermittent, left worse than right. She has lost a lot  of weight since her APR surgery.   #Family history with half brother having's history of colon cancer prostate cancer.  Personal history of colorectal cancer.  Patient has not decided if she wants genetic testing.    # history of PE( 01/13/2020)  in the bilateral lower extremity DVT (01/13/2020).   She finishes 6 months of anticoagulation with Eliquis 5 mg twice daily. Now switched to Eliquis 2.5 mg twice daily..  # She has now developed recurrent disease. #06/30/20  vaginal introitus mass biopsied. Pathology is consistent with metastatic colorectal adenocarcinoma I have discussed with [name]  Dr. MaHester Matesnd the mass is not resectable. Patient has also had colonoscopy by Dr. AnVicente Malesesterday. Normal examination. # 07/16/2020 cycle 1 FOLFIRI  # 07/20/2020 PET scan was done for further evaluation, images are consistent with local recurrence, no distant metastasis. #Discussed with radiation oncology Dr. ChBaruch Goutyill recommends concurrent chemotherapy and radiation. 08/02/2020-08/16/2020, patient starts radiation.  Xeloda was held due to neutropenia 08/17/2020,-09/06/2020 Xeloda 1500 mg twice daily concurrently with radiation  01/31/21 started on FOLFIRI + Bev 05/18/2021 CT chest abdomen pelvis showed Previously noted enlargement of bilateral inguinal lymph nodes is resolved, consistent with treatment response of nodal metastatic disease. Interval decrease in size of multiple small bilateral pulmonary nodules, consistent with treatment response of pulmonary metastatic disease. No evidence of new metastatic disease. 05/24/2021 - 08/30/2021, continued on FOLFIRI plus bevacizumab.  Irinotecan dose was reduced, eventually 10057m2  09/02/2021, CT chest abdomen pelvis without contrast Showed small bilateral pulmonary nodules, unchanged.  Stable metastatic disease.  No noncontrast evidence of new metastatic disease in the chest abdomen pelvis.  Small parastomal hernia.  Enlargement of main pulmonary artery.   Coronary artery disease.  09/13/2021, maintenance 5-FU/bevacizumab 11/28/2021, 5-FU/Irinotecan/bevacizumab.  Irinotecan 100 mg/m2 was added back due to progressively increasing CEA.  INTERVAL HISTORY Jacqueline Yoder is a 77 y.o. female who has above history reviewed by me presents for follow-up of rectal cancer. Currently on dose reduced FOLFIRI/bevacizumab  overall she tolerates well.  Chronic diarrhea is manageable and stable symptoms. Improved with antidiarrhea PRN.  She was accompanied by her daughter today.   New onset of left hip pain for a few days ago. She contributes to odd position she had while getting in and out of car. She denies any difficulty bearing weight on left hip.  Daughter noticed mild limping.     Review of Systems  Constitutional:  Positive for fatigue. Negative for appetite change, chills, fever and unexpected weight change.  HENT:   Negative for hearing loss and voice change.   Eyes:  Negative for eye problems.  Respiratory:  Negative for chest tightness and cough.   Cardiovascular:  Negative for chest pain.  Gastrointestinal:  Negative for abdominal distention, abdominal pain, blood in stool, constipation, diarrhea and nausea.  Endocrine: Negative for hot flashes.  Genitourinary:  Negative for difficulty urinating and frequency.   Musculoskeletal:  Negative for arthralgias.  Skin:  Negative for itching and rash.  Neurological:  Negative for extremity weakness and numbness.  Hematological:  Negative for adenopathy.  Psychiatric/Behavioral:  Negative for confusion.     MEDICAL HISTORY:  Past Medical History:  Diagnosis Date   Allergy    Arthritis    Blood clot in vein    Family history of colon cancer    GERD (gastroesophageal reflux disease)    Hypercholesteremia    Hypertension    Hypertension    Lower extremity edema    Personal history of chemotherapy    Rectal cancer ([place]) 12/2018   Urinary incontinence     SURGICAL HISTORY: Past Surgical  History:  Procedure Laterality Date   ABDOMINAL HYSTERECTOMY     CHOLECYSTECTOMY  1971   COLONOSCOPY WITH PROPOFOL N/A 12/03/2018   Procedure: COLONOSCOPY WITH PROPOFOL;  Surgeon: Lucilla Lame, MD;  Location: [facility] ENDOSCOPY;  Service: Endoscopy;  Laterality: N/A;   COLONOSCOPY WITH PROPOFOL N/A 07/15/2020   Procedure: COLONOSCOPY WITH PROPOFOL;  Surgeon: [name], MD;  [facility] ENDOSCOPY;  Service: Gastroenterology;  Laterality: N/A;   FLEXIBLE SIGMOIDOSCOPY N/A 12/06/2018   Procedure: FLEXIBLE SIGMOIDOSCOPY;  Surgeon: [name], MD;  Location: [facility] ENDOSCOPY;  Service: Endoscopy;  Laterality: N/A;   LAPAROSCOPIC COLOSTOMY  01/06/2019   PORTACATH PLACEMENT N/A 04/03/2019   Procedure: INSERTION PORT-A-CATH;  Surgeon: Jules Husbands, MD;  Location: [facility] ORS;  Service: General;  Laterality: N/A;    SOCIAL HISTORY: Social History   Socioeconomic History   Marital status: Widowed    Spouse name: Not on file   Number of children: Not on file   Years of education: Not on file   Highest education level: Not on file  Occupational History   Not on file  Tobacco Use   Smoking status: Former    Types: Cigarettes    Quit date: 12/02/1977    Years since quitting: 44.5   Smokeless tobacco: Former  Scientific laboratory technician Use: Never used  Substance and Sexual Activity   Alcohol use: Never   Drug use: Never   Sexual activity: Not Currently    Birth control/protection: None  Other Topics Concern   Not on file  Social History Narrative   Lives with daughter   Social Determinants of Health   Financial  Resource Strain: Low Risk  (06/28/2021)   Overall Financial Resource Strain (CARDIA)    Difficulty of Paying Living Expenses: Not hard at all  Food Insecurity: No Food Insecurity (06/28/2021)   Hunger Vital Sign    Worried About Running Out of Food in the Last Year: Never true    Ran Out of Food in the Last Year: Never true  Transportation Needs: No Transportation Needs (06/28/2021)    PRAPARE - Hydrologist (Medical): No    Lack of Transportation (Non-Medical): No  Physical Activity: Inactive (06/28/2021)   Exercise Vital Sign    Days of Exercise per Week: 0 days    Minutes of Exercise per Session: 0 min  Stress: No Stress Concern Present (06/28/2021)   [place]    Feeling of Stress : Not at all  Social Connections: Not on file  Intimate Partner Violence: Not on file    FAMILY HISTORY: Family History  Problem Relation Age of Onset   Colon cancer Brother 69       exposure to chemicals [place]   Hypertension Mother    Stroke Mother    Kidney failure Father    Breast cancer Neg Hx    Ovarian cancer Neg Hx     ALLERGIES:  is allergic to sulfamethoxazole-trimethoprim.  MEDICATIONS:  Current Outpatient Medications  Medication Sig Dispense Refill   Cholecalciferol (VITAMIN D3) 2000 units capsule Take 2,000 Units by mouth daily.     diclofenac sodium (VOLTAREN) 1 % GEL Apply 2 g topically 4 (four) times daily as needed (joint pain).  11   diphenoxylate-atropine (LOMOTIL) 2.5-0.025 MG tablet Take 1 tablet by mouth 4 (four) times daily as needed for diarrhea or loose stools. 30 tablet 1   ELIQUIS 2.5 MG TABS tablet TAKE 1 TABLET BY MOUTH TWICE  DAILY 60 tablet 11   fluticasone (FLONASE) 50 MCG/ACT nasal spray USE 1 SPRAY IN EACH NOSTRIL ONCE DAILY 16 g 3   gabapentin (NEURONTIN) 100 MG capsule Take 1 capsule (100 mg total) by mouth at bedtime. 30 capsule 0   ipratropium (ATROVENT) 0.03 % nasal spray Place 1 spray into both nostrils 2 (two) times daily. 30 mL 2   lidocaine-prilocaine (EMLA) cream Apply 1 application. topically as needed. 30 g 6   loperamide (IMODIUM) 2 MG capsule Take 1 capsule (2 mg total) by mouth See admin instructions. With onset of loose stool, take 65m followed by 249mevery 2 hours,  Maximum: 16 mg/day 120 capsule 1   loratadine (CLARITIN) 10 MG tablet  Take 10 mg by mouth daily.     Multiple Vitamins-Minerals (ONE-A-DAY WOMENS 50 PLUS PO) Take 1 tablet by mouth daily.      potassium chloride SA (KLOR-CON M) 20 MEQ tablet Take 2 tablets (40 mEq total) by mouth daily. 60 tablet 5   simvastatin (ZOCOR) 40 MG tablet TAKE 1 TABLET BY MOUTH AT  BEDTIME 90 tablet 1   triamterene-hydrochlorothiazide (DYAZIDE) 37.5-25 MG capsule TAKE 1 CAPSULE BY MOUTH  DAILY 90 capsule 1   zinc gluconate 50 MG tablet Take 50 mg by mouth daily.     cholestyramine (QUESTRAN) 4 g packet Take 1 packet (4 g total) by mouth 3 (three) times daily. (Patient not taking: Reported on 06/12/2022) 90 each 1   No current facility-administered medications for this visit.   Facility-Administered Medications Ordered in Other Visits  Medication Dose Route Frequency Provider Last Rate Last Admin  0.9 %  sodium chloride infusion   Intravenous Once Earlie Server, MD       atropine injection 0.5 mg  0.5 mg Intravenous Once PRN Earlie Server, MD       bevacizumab-awwb (MVASI) 350 mg in sodium chloride 0.9 % 100 mL chemo infusion  5 mg/kg (Treatment Plan Recorded) Intravenous Once Earlie Server, MD       dexamethasone (DECADRON) 10 mg in sodium chloride 0.9 % 50 mL IVPB  10 mg Intravenous Once Earlie Server, MD       fluorouracil (ADRUCIL) 4,300 mg in sodium chloride 0.9 % 64 mL chemo infusion  2,400 mg/m2 (Treatment Plan Recorded) Intravenous 1 day or 1 dose Earlie Server, MD       irinotecan (CAMPTOSAR) 180 mg in sodium chloride 0.9 % 500 mL chemo infusion  100 mg/m2 (Treatment Plan Recorded) Intravenous Once Earlie Server, MD       leucovorin 720 mg in sodium chloride 0.9 % 250 mL infusion  400 mg/m2 (Treatment Plan Recorded) Intravenous Once Earlie Server, MD       palonosetron (ALOXI) injection 0.25 mg  0.25 mg Intravenous Once Earlie Server, MD         PHYSICAL EXAMINATION: ECOG PERFORMANCE STATUS: 1 - Symptomatic but completely ambulatory  Physical Exam Constitutional:      General: She is not in acute  distress. HENT:     Head: Normocephalic and atraumatic.  Eyes:     General: No scleral icterus. Cardiovascular:     Rate and Rhythm: Normal rate and regular rhythm.     Heart sounds: Normal heart sounds.  Pulmonary:     Effort: Pulmonary effort is normal. No respiratory distress.     Breath sounds: No wheezing.  Abdominal:     General: Bowel sounds are normal. There is no distension.     Palpations: Abdomen is soft.     Comments: + Colostomy bag   Musculoskeletal:        General: No deformity. Normal range of motion.     Cervical back: Normal range of motion and neck supple.     Comments: Right lower extremity subcutaneous cystic nodule, mobile, non tender  Skin:    General: Skin is warm and dry.     Findings: No erythema or rash.  Neurological:     Mental Status: She is alert and oriented to person, place, and time. Mental status is at baseline.     Cranial Nerves: No cranial nerve deficit.     Coordination: Coordination normal.       LABORATORY DATA:  I have reviewed the data as listed Lab Results  Component Value Date   WBC 6.7 06/26/2022   HGB 12.1 06/26/2022   HCT 38.4 06/26/2022   MCV 96.5 06/26/2022   PLT 210 06/26/2022   Recent Labs    05/29/22 0837 06/12/22 0843 06/26/22 0836  NA 138 138 139  K 4.1 4.1 4.2  CL 104 105 106  CO2 '27 28 27  ' GLUCOSE 98 97 97  BUN '12 12 14  ' CREATININE 1.02* 0.84 0.83  CALCIUM 9.4 9.1 9.3  GFRNONAA 57* >60 >60  PROT 7.1 6.6 6.7  ALBUMIN 4.1 3.9 3.9  AST '29 26 28  ' ALT '16 16 15  ' ALKPHOS 74 78 73  BILITOT 0.3 0.5 0.4    Iron/TIBC/Ferritin/ %Sat    Component Value Date/Time   IRON 40 06/23/2020 1124   TIBC 246 (L) 06/23/2020 1124   FERRITIN 338 (H) 06/23/2020 1124  IRONPCTSAT 16 06/23/2020 1124     RADIOGRAPHIC STUDIES: I have personally reviewed the radiological images as listed and agreed with the findings in the report. CT CHEST ABDOMEN PELVIS W CONTRAST  Result Date: 04/26/2022 CLINICAL DATA:  Patient  with history of rectal carcinoma. On chemotherapy. * Tracking Code: BO * EXAM: CT CHEST, ABDOMEN, AND PELVIS WITH CONTRAST TECHNIQUE: Multidetector CT imaging of the chest, abdomen and pelvis was performed following the standard protocol during bolus administration of intravenous contrast. RADIATION DOSE REDUCTION: This exam was performed according to the departmental dose-optimization program which includes automated exposure control, adjustment of the mA and/or kV according to patient size and/or use of iterative reconstruction technique. CONTRAST:  169m OMNIPAQUE IOHEXOL 300 MG/ML  SOLN COMPARISON:  CT C AP January 20, 2022 FINDINGS: CT CHEST FINDINGS Cardiovascular: Right anterior chest wall Port-A-Cath is present with tip terminating in the superior vena cava. Normal heart size. Trace fluid superior pericardial recess. Thoracic aortic vascular calcifications. Mediastinum/Nodes: No enlarged axillary, mediastinal or hilar lymphadenopathy. Normal appearance of the esophagus. Lungs/Pleura: Central airways are patent. Dependent atelectasis within the lower lobes bilaterally. Stable 4 mm left upper lobe nodule (image 47; series 3). Stable 3 mm left upper lobe nodule (image 28; series 3). Biapical pleuroparenchymal thickening/scarring. Mild interval increase in size of 3 mm right upper lobe nodule (image 41; series 3), previously 2 mm. Interval increase in size of 8 mm right middle lobe nodule (image 104; series 3), previously 5 mm. Slight interval increase in size of 7 mm right middle lobe nodule (image 100; series 3), previously 5 mm. No pleural effusion or pneumothorax. Musculoskeletal: No aggressive or acute appearing osseous lesions. CT ABDOMEN PELVIS FINDINGS Hepatobiliary: Liver is normal in size and contour. No focal hepatic lesion identified. Stable biliary ductal dilatation. Pancreas: Unremarkable Spleen: Unremarkable Adrenals/Urinary Tract: Normal adrenal glands. Kidneys enhance symmetrically with contrast.  Bilateral renal cysts redemonstrated. No hydronephrosis. Urinary bladder is unremarkable. Stomach/Bowel: Patient is status post APR with left colostomy. No evidence for bowel obstruction. No free fluid or free intraperitoneal air. Normal morphology the stomach. Redemonstrated pelvic floor laxity and associated bulging of small bowel. Vascular/Lymphatic: Normal caliber abdominal aorta. Peripheral calcified atherosclerotic plaque. No retroperitoneal lymphadenopathy. Reproductive: Prior hysterectomy.  Pelvic masses. Other: None. Musculoskeletal: No aggressive or acute appearing osseous lesions. Lumbar spine degenerative changes. IMPRESSION: 1. Slight interval increase in size of a few of the scattered pulmonary nodules. Other nodules are stable. 2. Similar biliary ductal dilatation. 3. Prior APR with left colostomy, similar to prior. Electronically Signed   By: DLovey NewcomerM.D.   On: 04/26/2022 17:23      CT CHEST ABDOMEN PELVIS W CONTRAST  Result Date: 04/26/2022 CLINICAL DATA:  Patient with history of rectal carcinoma. On chemotherapy. * Tracking Code: BO * EXAM: CT CHEST, ABDOMEN, AND PELVIS WITH CONTRAST TECHNIQUE: Multidetector CT imaging of the chest, abdomen and pelvis was performed following the standard protocol during bolus administration of intravenous contrast. RADIATION DOSE REDUCTION: This exam was performed according to the departmental dose-optimization program which includes automated exposure control, adjustment of the mA and/or kV according to patient size and/or use of iterative reconstruction technique. CONTRAST:  1071mOMNIPAQUE IOHEXOL 300 MG/ML  SOLN COMPARISON:  CT C AP January 20, 2022 FINDINGS: CT CHEST FINDINGS Cardiovascular: Right anterior chest wall Port-A-Cath is present with tip terminating in the superior vena cava. Normal heart size. Trace fluid superior pericardial recess. Thoracic aortic vascular calcifications. Mediastinum/Nodes: No enlarged axillary, mediastinal or hilar  lymphadenopathy. Normal  appearance of the esophagus. Lungs/Pleura: Central airways are patent. Dependent atelectasis within the lower lobes bilaterally. Stable 4 mm left upper lobe nodule (image 47; series 3). Stable 3 mm left upper lobe nodule (image 28; series 3). Biapical pleuroparenchymal thickening/scarring. Mild interval increase in size of 3 mm right upper lobe nodule (image 41; series 3), previously 2 mm. Interval increase in size of 8 mm right middle lobe nodule (image 104; series 3), previously 5 mm. Slight interval increase in size of 7 mm right middle lobe nodule (image 100; series 3), previously 5 mm. No pleural effusion or pneumothorax. Musculoskeletal: No aggressive or acute appearing osseous lesions. CT ABDOMEN PELVIS FINDINGS Hepatobiliary: Liver is normal in size and contour. No focal hepatic lesion identified. Stable biliary ductal dilatation. Pancreas: Unremarkable Spleen: Unremarkable Adrenals/Urinary Tract: Normal adrenal glands. Kidneys enhance symmetrically with contrast. Bilateral renal cysts redemonstrated. No hydronephrosis. Urinary bladder is unremarkable. Stomach/Bowel: Patient is status post APR with left colostomy. No evidence for bowel obstruction. No free fluid or free intraperitoneal air. Normal morphology the stomach. Redemonstrated pelvic floor laxity and associated bulging of small bowel. Vascular/Lymphatic: Normal caliber abdominal aorta. Peripheral calcified atherosclerotic plaque. No retroperitoneal lymphadenopathy. Reproductive: Prior hysterectomy.  Pelvic masses. Other: None. Musculoskeletal: No aggressive or acute appearing osseous lesions. Lumbar spine degenerative changes. IMPRESSION: 1. Slight interval increase in size of a few of the scattered pulmonary nodules. Other nodules are stable. 2. Similar biliary ductal dilatation. 3. Prior APR with left colostomy, similar to prior. Electronically Signed   By: Lovey Newcomer M.D.   On: 04/26/2022 17:23

## 2022-06-26 NOTE — Assessment & Plan Note (Signed)
patient will receive long-acting GCSF   Ziextenzo prophylaxis on D3 

## 2022-06-26 NOTE — Addendum Note (Signed)
Addended by: Quita Skye on: 06/26/2022 12:55 PM   Modules accepted: Orders

## 2022-06-26 NOTE — Assessment & Plan Note (Signed)
Chemotherapy plan as listed above 

## 2022-06-27 ENCOUNTER — Other Ambulatory Visit: Payer: Self-pay | Admitting: Family Medicine

## 2022-06-27 LAB — CEA: CEA: 2018 ng/mL — ABNORMAL HIGH (ref 0.0–4.7)

## 2022-06-28 ENCOUNTER — Ambulatory Visit
Admission: RE | Admit: 2022-06-28 | Discharge: 2022-06-28 | Disposition: A | Discharge status: Home / Self Care | Payer: Medicare Other | Attending: Oncology | Admitting: Oncology

## 2022-06-28 ENCOUNTER — Inpatient Hospital Stay: Payer: Medicare Other

## 2022-06-28 ENCOUNTER — Ambulatory Visit
Admission: RE | Admit: 2022-06-28 | Discharge: 2022-06-28 | Disposition: A | Discharge status: Home / Self Care | Payer: Medicare Other | Source: Ambulatory Visit | Attending: Oncology | Admitting: Oncology

## 2022-06-28 DIAGNOSIS — Z86711 Personal history of pulmonary embolism: Secondary | ICD-10-CM | POA: Diagnosis not present

## 2022-06-28 DIAGNOSIS — K5900 Constipation, unspecified: Secondary | ICD-10-CM | POA: Diagnosis not present

## 2022-06-28 DIAGNOSIS — D701 Agranulocytosis secondary to cancer chemotherapy: Secondary | ICD-10-CM | POA: Diagnosis not present

## 2022-06-28 DIAGNOSIS — N281 Cyst of kidney, acquired: Secondary | ICD-10-CM | POA: Diagnosis not present

## 2022-06-28 DIAGNOSIS — C799 Secondary malignant neoplasm of unspecified site: Secondary | ICD-10-CM

## 2022-06-28 DIAGNOSIS — M25552 Pain in left hip: Secondary | ICD-10-CM | POA: Insufficient documentation

## 2022-06-28 DIAGNOSIS — C7800 Secondary malignant neoplasm of unspecified lung: Secondary | ICD-10-CM | POA: Diagnosis not present

## 2022-06-28 DIAGNOSIS — Z5189 Encounter for other specified aftercare: Secondary | ICD-10-CM | POA: Diagnosis not present

## 2022-06-28 DIAGNOSIS — I2510 Atherosclerotic heart disease of native coronary artery without angina pectoris: Secondary | ICD-10-CM | POA: Diagnosis not present

## 2022-06-28 DIAGNOSIS — I10 Essential (primary) hypertension: Secondary | ICD-10-CM | POA: Diagnosis not present

## 2022-06-28 DIAGNOSIS — E876 Hypokalemia: Secondary | ICD-10-CM | POA: Diagnosis not present

## 2022-06-28 DIAGNOSIS — Z79899 Other long term (current) drug therapy: Secondary | ICD-10-CM | POA: Diagnosis not present

## 2022-06-28 DIAGNOSIS — C20 Malignant neoplasm of rectum: Secondary | ICD-10-CM | POA: Diagnosis not present

## 2022-06-28 DIAGNOSIS — K838 Other specified diseases of biliary tract: Secondary | ICD-10-CM | POA: Diagnosis not present

## 2022-06-28 DIAGNOSIS — R1900 Intra-abdominal and pelvic swelling, mass and lump, unspecified site: Secondary | ICD-10-CM | POA: Diagnosis not present

## 2022-06-28 DIAGNOSIS — K435 Parastomal hernia without obstruction or  gangrene: Secondary | ICD-10-CM | POA: Diagnosis not present

## 2022-06-28 DIAGNOSIS — K219 Gastro-esophageal reflux disease without esophagitis: Secondary | ICD-10-CM | POA: Diagnosis not present

## 2022-06-28 DIAGNOSIS — Z86718 Personal history of other venous thrombosis and embolism: Secondary | ICD-10-CM | POA: Diagnosis not present

## 2022-06-28 DIAGNOSIS — D6481 Anemia due to antineoplastic chemotherapy: Secondary | ICD-10-CM | POA: Diagnosis not present

## 2022-06-28 DIAGNOSIS — Z933 Colostomy status: Secondary | ICD-10-CM | POA: Diagnosis not present

## 2022-06-28 DIAGNOSIS — Z5111 Encounter for antineoplastic chemotherapy: Secondary | ICD-10-CM | POA: Diagnosis not present

## 2022-06-28 DIAGNOSIS — M47816 Spondylosis without myelopathy or radiculopathy, lumbar region: Secondary | ICD-10-CM | POA: Diagnosis not present

## 2022-06-28 DIAGNOSIS — T451X5A Adverse effect of antineoplastic and immunosuppressive drugs, initial encounter: Secondary | ICD-10-CM | POA: Diagnosis not present

## 2022-06-28 DIAGNOSIS — R634 Abnormal weight loss: Secondary | ICD-10-CM | POA: Diagnosis not present

## 2022-06-28 DIAGNOSIS — E7800 Pure hypercholesterolemia, unspecified: Secondary | ICD-10-CM | POA: Diagnosis not present

## 2022-06-28 MED ORDER — PEGFILGRASTIM INJECTION 6 MG/0.6ML ~~LOC~~
6.0000 mg | PREFILLED_SYRINGE | Freq: Once | SUBCUTANEOUS | Status: AC
  Administered 2022-06-28: 6 mg via SUBCUTANEOUS
  Filled 2022-06-28: qty 0.6

## 2022-06-28 MED ORDER — HEPARIN SOD (PORK) LOCK FLUSH 100 UNIT/ML IV SOLN
500.0000 [IU] | Freq: Once | INTRAVENOUS | Status: AC | PRN
  Administered 2022-06-28: 500 [IU]
  Filled 2022-06-28: qty 5

## 2022-06-28 MED ORDER — SODIUM CHLORIDE 0.9% FLUSH
10.0000 mL | INTRAVENOUS | Status: DC | PRN
  Administered 2022-06-28: 10 mL
  Filled 2022-06-28: qty 10

## 2022-06-28 NOTE — Telephone Encounter (Signed)
.  rcpro  

## 2022-06-29 ENCOUNTER — Encounter: Payer: Self-pay | Admitting: Oncology

## 2022-06-30 ENCOUNTER — Ambulatory Visit (INDEPENDENT_AMBULATORY_CARE_PROVIDER_SITE_OTHER): Payer: Medicare Other

## 2022-06-30 VITALS — BP 130/80 | Ht 64.0 in | Wt 158.8 lb

## 2022-06-30 DIAGNOSIS — Z0000 Encounter for general adult medical examination without abnormal findings: Secondary | ICD-10-CM | POA: Diagnosis not present

## 2022-06-30 NOTE — Progress Notes (Signed)
Subjective:   GABRIELA IRIGOYEN is a 76 y.o. female who presents for Medicare Annual (Subsequent) preventive examination.  Review of Systems     Cardiac Risk Factors include: advanced age (>72mn, >>44women);dyslipidemia;hypertension;sedentary lifestyle     Objective:    Today's Vitals   06/30/22 0934  BP: 130/80  Weight: 158 lb 12.8 oz (72 kg)  Height: '5\' 4"'$  (1.626 m)   Body mass index is 27.26 kg/m.     06/30/2022    9:43 AM 06/26/2022   11:00 AM 06/26/2022    9:01 AM 05/31/2022    1:00 PM 05/29/2022    8:49 AM 05/15/2022    9:01 AM 05/01/2022    9:01 AM  Advanced Directives  Does Patient Have a Medical Advance Directive? Yes Yes Yes Yes Yes No No  Type of AParamedicof [place] will  Living will;Healthcare Power of [place]  Does patient want to make changes to medical advance directive? No - Patient declined No - Patient declined Yes (ED - Information included in AVS) No - Patient declined   No - Patient declined  Copy of [place] Chart? Yes - validated most recent copy scanned in chart (See row information)     No - copy requested   Would patient like information on creating a medical advance directive? No - Patient declined  Yes (ED - Information included in AVS)   No - Patient declined     Current Medications (verified) Outpatient Encounter Medications as of 06/30/2022  Medication Sig   Cholecalciferol (VITAMIN D3) 2000 units capsule Take 2,000 Units by mouth daily.   cholestyramine (QUESTRAN) 4 g packet Take 1 packet (4 g total) by mouth 3 (three) times daily.   diclofenac sodium (VOLTAREN) 1 % GEL Apply 2 g topically 4 (four) times daily as needed (joint pain).   diphenoxylate-atropine (LOMOTIL) 2.5-0.025 MG tablet Take 1 tablet by mouth 4 (four) times daily as needed for diarrhea or loose stools.   ELIQUIS 2.5 MG TABS tablet TAKE 1 TABLET BY MOUTH TWICE  DAILY   fluticasone (FLONASE)  50 MCG/ACT nasal spray USE 1 SPRAY IN EACH NOSTRIL ONCE DAILY   gabapentin (NEURONTIN) 100 MG capsule Take 1 capsule (100 mg total) by mouth at bedtime.   ipratropium (ATROVENT) 0.03 % nasal spray Place 1 spray into both nostrils 2 (two) times daily.   lidocaine-prilocaine (EMLA) cream Apply 1 application. topically as needed.   loperamide (IMODIUM) 2 MG capsule Take 1 capsule (2 mg total) by mouth See admin instructions. With onset of loose stool, take '4mg'$  followed by '2mg'$  every 2 hours,  Maximum: 16 mg/day   loratadine (CLARITIN) 10 MG tablet Take 10 mg by mouth daily.   Multiple Vitamins-Minerals (ONE-A-DAY WOMENS 50 PLUS PO) Take 1 tablet by mouth daily.    potassium chloride SA (KLOR-CON M) 20 MEQ tablet Take 2 tablets (40 mEq total) by mouth daily.   simvastatin (ZOCOR) 40 MG tablet TAKE 1 TABLET BY MOUTH AT  BEDTIME   triamterene-hydrochlorothiazide (DYAZIDE) 37.5-25 MG capsule TAKE 1 CAPSULE BY MOUTH DAILY   zinc gluconate 50 MG tablet Take 50 mg by mouth daily.   [DISCONTINUED] prochlorperazine (COMPAZINE) 10 MG tablet Take 1 tablet (10 mg total) by mouth every 6 (six) hours as needed (NAUSEA).   Facility-Administered Encounter Medications as of 06/30/2022  Medication   diphenhydrAMINE (BENADRYL) injection 25 mg    Allergies (verified) Sulfamethoxazole-trimethoprim  History: Past Medical History:  Diagnosis Date   Allergy    Arthritis    Blood clot in vein    Family history of colon cancer    GERD (gastroesophageal reflux disease)    Hypercholesteremia    Hypertension    Hypertension    Lower extremity edema    Personal history of chemotherapy    Rectal cancer ([place]) 12/2018   Urinary incontinence    Past Surgical History:  Procedure Laterality Date   ABDOMINAL HYSTERECTOMY     CHOLECYSTECTOMY  1971   COLONOSCOPY WITH PROPOFOL N/A 12/03/2018   Procedure: COLONOSCOPY WITH PROPOFOL;  Surgeon: Lucilla Lame, MD;  Location: [facility] ENDOSCOPY;  Service: Endoscopy;  Laterality:  N/A;   COLONOSCOPY WITH PROPOFOL N/A 07/15/2020   Procedure: COLONOSCOPY WITH PROPOFOL;  Surgeon: [name], MD;  Location: [facility] ENDOSCOPY;  Service: Gastroenterology;  Laterality: N/A;   FLEXIBLE SIGMOIDOSCOPY N/A 12/06/2018   Procedure: FLEXIBLE SIGMOIDOSCOPY;  Surgeon: [name], MD;  Location: [facility] ENDOSCOPY;  Service: Endoscopy;  Laterality: N/A;   LAPAROSCOPIC COLOSTOMY  01/06/2019   PORTACATH PLACEMENT N/A 04/03/2019   Procedure: INSERTION PORT-A-CATH;  Surgeon: Jules Husbands, MD;  Location: [facility] ORS;  Service: General;  Laterality: N/A;   Family History  Problem Relation Age of Onset   Colon cancer Brother 50       exposure to chemicals [place]   Hypertension Mother    Stroke Mother    Kidney failure Father    Breast cancer Neg Hx    Ovarian cancer Neg Hx    Social History   Socioeconomic History   Marital status: Widowed    Spouse name: Not on file   Number of children: Not on file   Years of education: Not on file   Highest education level: Not on file  Occupational History   Not on file  Tobacco Use   Smoking status: Former    Types: Cigarettes    Quit date: 12/02/1977    Years since quitting: 44.6   Smokeless tobacco: Former  Scientific laboratory technician Use: Never used  Substance and Sexual Activity   Alcohol use: Never   Drug use: Never   Sexual activity: Not Currently    Birth control/protection: None  Other Topics Concern   Not on file  Social History Narrative   Lives with daughter   Social Determinants of Health   Financial Resource Strain: Medium Risk (06/30/2022)   Overall Financial Resource Strain (CARDIA)    Difficulty of Paying Living Expenses: Somewhat hard  Food Insecurity: No Food Insecurity (06/30/2022)   Hunger Vital Sign    Worried About Running Out of Food in the Last Year: Never true    Ran Out of Food in the Last Year: Never true  Transportation Needs: No Transportation Needs (06/30/2022)   PRAPARE - Radiographer, therapeutic (Medical): No    Lack of Transportation (Non-Medical): No  Physical Activity: Inactive (06/30/2022)   Exercise Vital Sign    Days of Exercise per Week: 0 days    Minutes of Exercise per Session: 0 min  Stress: No Stress Concern Present (06/30/2022)   [place]    Feeling of Stress : Not at all  Social Connections: Moderately Isolated (06/30/2022)   Social Connection and Isolation Panel [NHANES]    Frequency of Communication with Friends and Family: More than three times a week    Frequency of Social Gatherings with Friends  and Family: Twice a week    Attends Religious Services: More than 4 times per year    Active Member of Clubs or Organizations: No    Attends Archivist Meetings: Never    Marital Status: Widowed    Tobacco Counseling Counseling given: Not Answered   Clinical Intake:  Pre-visit preparation completed: Yes  Pain : No/denies pain     Nutritional Risks: None Diabetes: No  How often do you need to have someone help you when you read instructions, pamphlets, or other written materials from your doctor or pharmacy?: 1 - Never  Diabetic?no  Interpreter Needed?: No  Information entered by :: Kirke Shaggy, LPN   Activities of Daily Living    06/30/2022    9:45 AM  In your present state of health, do you have any difficulty performing the following activities:  Hearing? 0  Vision? 0  Difficulty concentrating or making decisions? 0  Walking or climbing stairs? 0  Dressing or bathing? 0  Doing errands, shopping? 0  Preparing Food and eating ? N  Using the Toilet? N  In the past six months, have you accidently leaked urine? N  Do you have problems with loss of bowel control? N  Managing your Medications? N  Managing your Finances? N  Housekeeping or managing your Housekeeping? N    Patient Care Team: Olin Hauser, DO as PCP - General (Family  Medicine) Clent Jacks, RN as Registered Nurse Earlie Server, MD as Consulting Physician (Hematology and Oncology) Cammie Sickle, MD as Consulting Physician (Oncology)  Indicate any recent Medical Services you may have received from other than [facility] providers in the past year (date may be approximate).     Assessment:   This is a routine wellness examination for [facility].  Hearing/Vision screen Hearing Screening - Comments:: No aids Vision Screening - Comments:: Wears glasses- Dr.Thurmond  Dietary issues and exercise activities discussed: Current Exercise Habits: The patient does not participate in regular exercise at present   Goals Addressed             This Visit's Progress    DIET - EAT MORE FRUITS AND VEGETABLES         Depression Screen    06/30/2022    9:45 AM 01/02/2022    9:48 AM 06/28/2021    9:02 AM 09/13/2020    3:11 PM  PHQ 2/9 Scores  PHQ - 2 Score 1 0 0 0  PHQ- 9 Score 1 0      Fall Risk    06/30/2022    9:44 AM 01/02/2022    9:48 AM 06/28/2021    9:02 AM 06/27/2021    8:04 AM 09/13/2020    3:11 PM  Fall Risk   Falls in the past year? 0 0 0 0 0  Number falls in past yr: 0 0  0 0  Injury with Fall? 0 0  0 0  Risk for fall due to : No Fall Risks No Fall Risks Medication side effect    Follow up Falls prevention discussed Falls evaluation completed Falls evaluation completed;Education provided;Falls prevention discussed Falls evaluation completed Falls evaluation completed    FALL RISK PREVENTION PERTAINING TO THE HOME:  Any stairs in or around the home? Yes  If so, are there any without handrails? No  Home free of loose throw rugs in walkways, pet beds, electrical cords, etc? Yes  Adequate lighting in your home to reduce risk of falls? Yes   ASSISTIVE  DEVICES UTILIZED TO PREVENT FALLS:  Life alert? No  Use of a cane, walker or w/c? Yes  Grab bars in the bathroom? No  Shower chair or bench in shower? No  Elevated toilet seat or a handicapped  toilet? Yes   TIMED UP AND GO:  Was the test performed? Yes .  Length of time to ambulate 10 feet: 4 sec.   Gait steady and fast with assistive device  Cognitive Function:        06/30/2022    9:57 AM 06/28/2021    9:05 AM  6CIT Screen  What Year? 0 points 0 points  What month? 0 points 0 points  What time? 0 points 3 points  Count back from 20 0 points 0 points  Months in reverse 0 points 0 points  Repeat phrase 0 points 4 points  Total Score 0 points 7 points    Immunizations Immunization History  Administered Date(s) Administered   Fluad Quad(high Dose 65+) 07/23/2020, 06/27/2021   Influenza Inj Mdck Quad Pf 10/03/2016, 08/26/2019   Influenza Split 10/01/2015   Influenza-Unspecified 08/08/2017, 07/30/2018, 07/18/2019   PFIZER Comirnaty(Gray Top)Covid-19 Tri-Sucrose Vaccine 12/01/2019, 12/23/2019   PFIZER(Purple Top)SARS-COV-2 Vaccination 12/01/2019, 12/23/2019, 08/02/2020   Pneumococcal Conjugate-13 10/24/2016   Pneumococcal Polysaccharide-23 03/30/2015    TDAP status: Due, Education has been provided regarding the importance of this vaccine. Advised may receive this vaccine at local pharmacy or Health Dept. Aware to provide a copy of the vaccination record if obtained from local pharmacy or Health Dept. Verbalized acceptance and understanding.  Flu Vaccine status: Declined, Education has been provided regarding the importance of this vaccine but patient still declined. Advised may receive this vaccine at local pharmacy or Health Dept. Aware to provide a copy of the vaccination record if obtained from local pharmacy or Health Dept. Verbalized acceptance and understanding.  Pneumococcal vaccine status: Up to date  Covid-19 vaccine status: Completed vaccines  Qualifies for Shingles Vaccine? Yes   Zostavax completed No   Shingrix Completed?: No.    Education has been provided regarding the importance of this vaccine. Patient has been advised to call insurance company to  determine out of pocket expense if they have not yet received this vaccine. Advised may also receive vaccine at local pharmacy or Health Dept. Verbalized acceptance and understanding.  Screening Tests Health Maintenance  Topic Date Due   Hepatitis C Screening  Never done   TETANUS/TDAP  Never done   Zoster Vaccines- Shingrix (1 of 2) Never done   DEXA SCAN  Never done   COVID-19 Vaccine (6 - Pfizer risk series) 09/27/2020   COLONOSCOPY (Pts 45-22yr Insurance coverage will need to be confirmed)  07/15/2021   INFLUENZA VACCINE  05/16/2022   Pneumonia Vaccine 75 Years old  Completed   HPV VACCINES  Aged Out    Health Maintenance  Health Maintenance Due  Topic Date Due   Hepatitis C Screening  Never done   TETANUS/TDAP  Never done   Zoster Vaccines- Shingrix (1 of 2) Never done   DEXA SCAN  Never done   COVID-19 Vaccine (6 - Pfizer risk series) 09/27/2020   COLONOSCOPY (Pts 45-46yrInsurance coverage will need to be confirmed)  07/15/2021   INFLUENZA VACCINE  05/16/2022    Colorectal cancer screening: Type of screening: Colonoscopy. Completed 07/15/20. Repeat every 10 years  Mammogram status: Completed 01/16/22. Repeat every year  BDS referral declined at this time- going thru cancer treatment  Lung Cancer Screening: (Low Dose CT Chest recommended  if Age 65-80 years, 12 pack-year currently smoking OR have quit w/in 15years.) does not qualify.    Additional Screening:  Hepatitis C Screening: does qualify; Completed no  Vision Screening: Recommended annual ophthalmology exams for early detection of glaucoma and other disorders of the eye. Is the patient up to date with their annual eye exam?  Yes  Who is the provider or what is the name of the office in which the patient attends annual eye exams? Dr. Gwynn Burly If pt is not established with a provider, would they like to be referred to a provider to establish care? No .   Dental Screening: Recommended annual dental exams for  proper oral hygiene  Community Resource Referral / Chronic Care Management: CRR required this visit?  No   CCM required this visit?  No      Plan:     I have personally reviewed and noted the following in the patient's chart:   Medical and social history Use of alcohol, tobacco or illicit drugs  Current medications and supplements including opioid prescriptions. Patient is not currently taking opioid prescriptions. Functional ability and status Nutritional status Physical activity Advanced directives List of other physicians Hospitalizations, surgeries, and ER visits in previous 12 months Vitals Screenings to include cognitive, depression, and falls Referrals and appointments  In addition, I have reviewed and discussed with patient certain preventive protocols, quality metrics, and best practice recommendations. A written personalized care plan for preventive services as well as general preventive health recommendations were provided to patient.     Dionisio David, LPN   12/27/9700   Nurse Notes: none

## 2022-06-30 NOTE — Patient Instructions (Addendum)
Jacqueline Yoder , Thank you for taking time to come for your Medicare Wellness Visit. I appreciate your ongoing commitment to your health goals. Please review the following plan we discussed and let me know if I can assist you in the future.   Screening recommendations/referrals: Colonoscopy: 07/15/20 Mammogram: 01/16/22 Bone Density: declined referral for now Recommended yearly ophthalmology/optometry visit for glaucoma screening and checkup Recommended yearly dental visit for hygiene and checkup  Vaccinations: Influenza vaccine: 06/27/21 Pneumococcal vaccine: 10/24/16 Tdap vaccine: n/d Shingles vaccine: n/d   Covid-19:12/01/19, 12/23/19, 08/02/20  Advanced directives: no  Conditions/risks identified: none  Next appointment: Follow up in one year for your annual wellness visit 07/06/23 @ 10:45 am in person  Preventive Care 65 Years and Older, Female Preventive care refers to lifestyle choices and visits with your health care provider that can promote health and wellness. What does preventive care include? A yearly physical exam. This is also called an annual well check. Dental exams once or twice a year. Routine eye exams. Ask your health care provider how often you should have your eyes checked. Personal lifestyle choices, including: Daily care of your teeth and gums. Regular physical activity. Eating a healthy diet. Avoiding tobacco and drug use. Limiting alcohol use. Practicing safe sex. Taking low-dose aspirin every day. Taking vitamin and mineral supplements as recommended by your health care provider. What happens during an annual well check? The services and screenings done by your health care provider during your annual well check will depend on your age, overall health, lifestyle risk factors, and family history of disease. Counseling  Your health care provider may ask you questions about your: Alcohol use. Tobacco use. Drug use. Emotional well-being. Home and relationship  well-being. Sexual activity. Eating habits. History of falls. Memory and ability to understand (cognition). Work and work Statistician. Reproductive health. Screening  You may have the following tests or measurements: Height, weight, and BMI. Blood pressure. Lipid and cholesterol levels. These may be checked every 5 years, or more frequently if you are over 25 years old. Skin check. Lung cancer screening. You may have this screening every year starting at age 64 if you have a 30-pack-year history of smoking and currently smoke or have quit within the past 15 years. Fecal occult blood test (FOBT) of the stool. You may have this test every year starting at age 81. Flexible sigmoidoscopy or colonoscopy. You may have a sigmoidoscopy every 5 years or a colonoscopy every 10 years starting at age 39. Hepatitis C blood test. Hepatitis B blood test. Sexually transmitted disease (STD) testing. Diabetes screening. This is done by checking your blood sugar (glucose) after you have not eaten for a while (fasting). You may have this done every 1-3 years. Bone density scan. This is done to screen for osteoporosis. You may have this done starting at age 19. Mammogram. This may be done every 1-2 years. Talk to your health care provider about how often you should have regular mammograms. Talk with your health care provider about your test results, treatment options, and if necessary, the need for more tests. Vaccines  Your health care provider may recommend certain vaccines, such as: Influenza vaccine. This is recommended every year. Tetanus, diphtheria, and acellular pertussis (Tdap, Td) vaccine. You may need a Td booster every 10 years. Zoster vaccine. You may need this after age 62. Pneumococcal 13-valent conjugate (PCV13) vaccine. One dose is recommended after age 30. Pneumococcal polysaccharide (PPSV23) vaccine. One dose is recommended after age 39. Talk to your  health care provider about which  screenings and vaccines you need and how often you need them. This information is not intended to replace advice given to you by your health care provider. Make sure you discuss any questions you have with your health care provider. Document Released: 10/29/2015 Document Revised: 06/21/2016 Document Reviewed: 08/03/2015 Elsevier Interactive Patient Education  2017 [place] Prevention in the Home Falls can cause injuries. They can happen to people of all ages. There are many things you can do to make your home safe and to help prevent falls. What can I do on the outside of my home? Regularly fix the edges of walkways and driveways and fix any cracks. Remove anything that might make you trip as you walk through a door, such as a raised step or threshold. Trim any bushes or trees on the path to your home. Use bright outdoor lighting. Clear any walking paths of anything that might make someone trip, such as rocks or tools. Regularly check to see if handrails are loose or broken. Make sure that both sides of any steps have handrails. Any raised decks and porches should have guardrails on the edges. Have any leaves, snow, or ice cleared regularly. Use sand or salt on walking paths during winter. Clean up any spills in your garage right away. This includes oil or grease spills. What can I do in the bathroom? Use night lights. Install grab bars by the toilet and in the tub and shower. Do not use towel bars as grab bars. Use non-skid mats or decals in the tub or shower. If you need to sit down in the shower, use a plastic, non-slip stool. Keep the floor dry. Clean up any water that spills on the floor as soon as it happens. Remove soap buildup in the tub or shower regularly. Attach bath mats securely with double-sided non-slip rug tape. Do not have throw rugs and other things on the floor that can make you trip. What can I do in the bedroom? Use night lights. Make sure that you have a  light by your bed that is easy to reach. Do not use any sheets or blankets that are too big for your bed. They should not hang down onto the floor. Have a firm chair that has side arms. You can use this for support while you get dressed. Do not have throw rugs and other things on the floor that can make you trip. What can I do in the kitchen? Clean up any spills right away. Avoid walking on wet floors. Keep items that you use a lot in easy-to-reach places. If you need to reach something above you, use a strong step stool that has a grab bar. Keep electrical cords out of the way. Do not use floor polish or wax that makes floors slippery. If you must use wax, use non-skid floor wax. Do not have throw rugs and other things on the floor that can make you trip. What can I do with my stairs? Do not leave any items on the stairs. Make sure that there are handrails on both sides of the stairs and use them. Fix handrails that are broken or loose. Make sure that handrails are as long as the stairways. Check any carpeting to make sure that it is firmly attached to the stairs. Fix any carpet that is loose or worn. Avoid having throw rugs at the top or bottom of the stairs. If you do have throw rugs, attach them  to the floor with carpet tape. Make sure that you have a light switch at the top of the stairs and the bottom of the stairs. If you do not have them, ask someone to add them for you. What else can I do to help prevent falls? Wear shoes that: Do not have high heels. Have rubber bottoms. Are comfortable and fit you well. Are closed at the toe. Do not wear sandals. If you use a stepladder: Make sure that it is fully opened. Do not climb a closed stepladder. Make sure that both sides of the stepladder are locked into place. Ask someone to hold it for you, if possible. Clearly mark and make sure that you can see: Any grab bars or handrails. First and last steps. Where the edge of each step  is. Use tools that help you move around (mobility aids) if they are needed. These include: Canes. Walkers. Scooters. Crutches. Turn on the lights when you go into a dark area. Replace any light bulbs as soon as they burn out. Set up your furniture so you have a clear path. Avoid moving your furniture around. If any of your floors are uneven, fix them. If there are any pets around you, be aware of where they are. Review your medicines with your doctor. Some medicines can make you feel dizzy. This can increase your chance of falling. Ask your doctor what other things that you can do to help prevent falls. This information is not intended to replace advice given to you by your health care provider. Make sure you discuss any questions you have with your health care provider. Document Released: 07/29/2009 Document Revised: 03/09/2016 Document Reviewed: 11/06/2014 Elsevier Interactive Patient Education  2017 [employer].

## 2022-07-01 DIAGNOSIS — Z933 Colostomy status: Secondary | ICD-10-CM | POA: Diagnosis not present

## 2022-07-04 ENCOUNTER — Ambulatory Visit: Payer: Medicare Other

## 2022-07-05 ENCOUNTER — Ambulatory Visit (INDEPENDENT_AMBULATORY_CARE_PROVIDER_SITE_OTHER): Payer: Medicare Other | Admitting: Family Medicine

## 2022-07-05 ENCOUNTER — Encounter: Payer: Self-pay | Admitting: Family Medicine

## 2022-07-05 VITALS — BP 133/58 | HR 58 | Ht 64.0 in | Wt 157.0 lb

## 2022-07-05 DIAGNOSIS — Z23 Encounter for immunization: Secondary | ICD-10-CM | POA: Diagnosis not present

## 2022-07-05 DIAGNOSIS — N182 Chronic kidney disease, stage 2 (mild): Secondary | ICD-10-CM | POA: Diagnosis not present

## 2022-07-05 DIAGNOSIS — C799 Secondary malignant neoplasm of unspecified site: Secondary | ICD-10-CM | POA: Diagnosis not present

## 2022-07-05 DIAGNOSIS — Z933 Colostomy status: Secondary | ICD-10-CM

## 2022-07-05 DIAGNOSIS — I129 Hypertensive chronic kidney disease with stage 1 through stage 4 chronic kidney disease, or unspecified chronic kidney disease: Secondary | ICD-10-CM | POA: Diagnosis not present

## 2022-07-05 DIAGNOSIS — E782 Mixed hyperlipidemia: Secondary | ICD-10-CM

## 2022-07-05 MED ORDER — SIMVASTATIN 40 MG PO TABS: 40.0000 mg | ORAL_TABLET | Freq: Every day | ORAL | 3 refills | Status: DC

## 2022-07-05 MED ORDER — TRIAMTERENE-HCTZ 37.5-25 MG PO CAPS: 1.0000 | ORAL_CAPSULE | Freq: Every day | ORAL | 3 refills | Status: DC

## 2022-07-05 NOTE — Patient Instructions (Addendum)
Thank you for coming to the office today.  Flu Shot today  Future Shingles vaccine if interested  Yearly labs with Cholesterol and Thyroid.  Added refills for the Simvastatin and Triamterene-HCTZ BP med. For 1 year to [facility] mail order  For Left Hip Arthritis Reviewed X-ray  Recommend to start taking Tylenol Extra Strength '500mg'$  tabs - take 1 to 2 tabs per dose (max '1000mg'$ ) every 6-8 hours for pain (take regularly, don't skip a dose for next 7 days), max 24 hour daily dose is 6 tablets or '3000mg'$ . In the future you can repeat the same everyday Tylenol course for 1-2 weeks at a time.    Please schedule a Follow-up Appointment to: Return in about 6 months (around 01/03/2023) for 6 month follow-up specialist updates.  If you have any other questions or concerns, please feel free to call the office or send a message through [place]. You may also schedule an earlier appointment if necessary.  Additionally, you may be receiving a survey about your experience at our office within a few days to 1 week by e-mail or mail. We value your feedback.  Nobie Putnam, DO [place]

## 2022-07-05 NOTE — Progress Notes (Unsigned)
Subjective:    Patient ID: Jacqueline Yoder, female    DOB: Mar 12, 1946, 76 y.o.   MRN: [id number]  Jacqueline Yoder is a 76 y.o. female presenting on 07/05/2022 for Hypertension   HPI  Oncologist - Dr Earlie Server ([place]) Radiation Oncologist - Dr Noreene Filbert [facility]) GYN - Dani Gobble CNM ([facility])   Metastatic Adenocarcinoma Carcinoma of Colon Spread to Endometrium/Uterine Cancer Initial diagnosis followed since 2019 with postmenopausal vaginal bleeding and spotting, and ultimately led to tissue diagnosis from endometrial biopsy.   S/p12/2/20 APR/posterior vaginectomy / Hysterectomy (total abdominal) /BSO/VY-flap, s/p chemotherapy and radiation therapy   Continues with chemo/immunotherapy infusions with good results. She goes every 2 weeks for treatment.   Improved symptoms Continues treatment   CHRONIC HTN: Hypokalemia BP controlled Last lab 06/26/22 normal K 4.2 Current Meds - Triamterene-HCTZ 37.5-'25mg'$  daily. She is on Potassium Supplement 22mq daily Reports good compliance, took meds today. Tolerating well, w/o complaints. Denies CP, dyspnea, HA, edema, dizziness / lightheadedness    Chronic DVT / on Chronic Anticoagulation On Eliquis 2.'5mg'$  daily for anticoagulation   Gluteal Pressure Sore Stage 1 / Superficial - RESOLVED Advised that the pressure sore has resolved. She uses occasional Desitin for barrier support, which has resolved.  Left Hip Pain, Osteoarthritis Chronic problem with multiple joints with osteoarthritis in knees etc, and recently she admits flare up Left hip with some pain bothering her. She had X-ray done by Oncology Dr YTasia Catchingsand it also showed some osteoarthritic changes. She has treated with Tylenol and using cane to assist.      06/30/2022    9:45 AM 01/02/2022    9:48 AM 06/28/2021    9:02 AM  Depression screen PHQ 2/9  Decreased Interest 0 0 0  Down, Depressed, Hopeless 1 0 0  PHQ - 2 Score 1 0 0  Altered sleeping 0 0   Tired,  decreased energy 0 0   Change in appetite 0 0   Feeling bad or failure about yourself  0 0   Trouble concentrating 0 0   Moving slowly or fidgety/restless 0 0   Suicidal thoughts 0 0   PHQ-9 Score 1 0   Difficult doing work/chores Not difficult at all Not difficult at all     Social History   Tobacco Use   Smoking status: Former    Types: Cigarettes    Quit date: 12/02/1977    Years since quitting: 44.6   Smokeless tobacco: Former  VScientific laboratory technicianUse: Never used  Substance Use Topics   Alcohol use: Never   Drug use: Never    Review of Systems  Constitutional:  Negative for activity change, appetite change, chills, diaphoresis, fatigue and fever.  HENT:  Negative for congestion and hearing loss.   Eyes:  Negative for visual disturbance.  Respiratory:  Negative for cough, chest tightness, shortness of breath and wheezing.   Cardiovascular:  Negative for chest pain, palpitations and leg swelling.  Gastrointestinal:  Negative for abdominal pain, constipation, diarrhea, nausea and vomiting.  Genitourinary:  Negative for dysuria, frequency and hematuria.  Musculoskeletal:  Negative for arthralgias and neck pain.  Skin:  Negative for rash.  Neurological:  Negative for dizziness, weakness, light-headedness, numbness and headaches.  Hematological:  Negative for adenopathy.  Psychiatric/Behavioral:  Negative for behavioral problems, dysphoric mood and sleep disturbance.    Per HPI unless specifically indicated above     Objective:    BP (!) 133/58   Pulse (Marland Kitchen  58   Ht '5\' 4"'$  (1.626 m)   Wt 157 lb (71.2 kg)   SpO2 100%   BMI 26.95 kg/m   Wt Readings from Last 3 Encounters:  07/05/22 157 lb (71.2 kg)  06/30/22 158 lb 12.8 oz (72 kg)  06/26/22 158 lb (71.7 kg)    Physical Exam Vitals and nursing note reviewed.  Constitutional:      General: She is not in acute distress.    Appearance: She is well-developed. She is not diaphoretic.     Comments: Well-appearing,  comfortable, cooperative  HENT:     Head: Normocephalic and atraumatic.  Eyes:     General:        Right eye: No discharge.        Left eye: No discharge.     Conjunctiva/sclera: Conjunctivae normal.     Pupils: Pupils are equal, round, and reactive to light.  Neck:     Thyroid: No thyromegaly.  Cardiovascular:     Rate and Rhythm: Normal rate and regular rhythm.     Pulses: Normal pulses.     Heart sounds: Normal heart sounds. No murmur heard. Pulmonary:     Effort: Pulmonary effort is normal. No respiratory distress.     Breath sounds: Normal breath sounds. No wheezing or rales.  Abdominal:     General: Bowel sounds are normal. There is no distension.     Palpations: Abdomen is soft. There is no mass.     Tenderness: There is no abdominal tenderness.  Musculoskeletal:        General: No tenderness. Normal range of motion.     Cervical back: Normal range of motion and neck supple.     Right lower leg: No edema.     Left lower leg: No edema.     Comments: Upper / Lower Extremities: - Normal muscle tone, strength bilateral upper extremities 5/5, lower extremities 5/5  Using cane for ambulation assistance  Lymphadenopathy:     Cervical: No cervical adenopathy.  Skin:    General: Skin is warm and dry.     Findings: No erythema or rash.  Neurological:     Mental Status: She is alert and oriented to person, place, and time.     Comments: Distal sensation intact to light touch all extremities  Psychiatric:        Mood and Affect: Mood normal.        Behavior: Behavior normal.        Thought Content: Thought content normal.     Comments: Well groomed, good eye contact, normal speech and thoughts     I have personally reviewed the radiology report from 06/26/22 on Hip.  DG HIP UNILAT W OR W/O PELVIS 2-3 VIEWS LEFTPerformed 06/28/2022 Final result  Study Result CLINICAL DATA: Left hip pain.  EXAM: DG HIP (WITH OR WITHOUT PELVIS) 2-3V LEFT  COMPARISON: None  Available.  FINDINGS: Both hips are normally located. Mild degenerative changes. No acute bony findings and no plain film evidence of AVN. The pubic symphysis and SI joints are intact. No pelvic fractures or bone lesions.  IMPRESSION: Mild degenerative changes but no acute bony findings.   Electronically Signed By: Marijo Sanes M.D. On: 06/30/2022 09:07    Results for orders placed or performed in visit on 06/26/22  Protein, urine, random  Result Value Ref Range   Total Protein, Urine 19 mg/dL  CBC with Differential  Result Value Ref Range   WBC 6.7 4.0 - 10.5 K/uL  RBC 3.98 3.87 - 5.11 MIL/uL   Hemoglobin 12.1 12.0 - 15.0 g/dL   HCT 38.4 36.0 - 46.0 %   MCV 96.5 80.0 - 100.0 fL   MCH 30.4 26.0 - 34.0 pg   MCHC 31.5 30.0 - 36.0 g/dL   RDW 17.5 (H) 11.5 - 15.5 %   Platelets 210 150 - 400 K/uL   nRBC 0.0 0.0 - 0.2 %   Neutrophils Relative % 66 %   Neutro Abs 4.5 1.7 - 7.7 K/uL   Lymphocytes Relative 21 %   Lymphs Abs 1.4 0.7 - 4.0 K/uL   Monocytes Relative 9 %   Monocytes Absolute 0.6 0.1 - 1.0 K/uL   Eosinophils Relative 2 %   Eosinophils Absolute 0.1 0.0 - 0.5 K/uL   Basophils Relative 1 %   Basophils Absolute 0.0 0.0 - 0.1 K/uL   Immature Granulocytes 1 %   Abs Immature Granulocytes 0.05 0.00 - 0.07 K/uL  Comprehensive metabolic panel  Result Value Ref Range   Sodium 139 135 - 145 mmol/L   Potassium 4.2 3.5 - 5.1 mmol/L   Chloride 106 98 - 111 mmol/L   CO2 27 22 - 32 mmol/L   Glucose, Bld 97 70 - 99 mg/dL   BUN 14 8 - 23 mg/dL   Creatinine, Ser 0.83 0.44 - 1.00 mg/dL   Calcium 9.3 8.9 - 10.3 mg/dL   Total Protein 6.7 6.5 - 8.1 g/dL   Albumin 3.9 3.5 - 5.0 g/dL   AST 28 15 - 41 U/L   ALT 15 0 - 44 U/L   Alkaline Phosphatase 73 38 - 126 U/L   Total Bilirubin 0.4 0.3 - 1.2 mg/dL   GFR, Estimated >60 >60 mL/min   Anion gap 6 5 - 15  CEA  Result Value Ref Range   CEA 2,018.0 (H) 0.0 - 4.7 ng/mL      Assessment & Plan:   Problem List Items Addressed  This Visit     Benign hypertension with CKD (chronic kidney disease), stage II   Relevant Medications   triamterene-hydrochlorothiazide (DYAZIDE) 37.5-25 MG capsule   simvastatin (ZOCOR) 40 MG tablet   Colostomy in place (HCC)   Hyperlipidemia   Relevant Medications   triamterene-hydrochlorothiazide (DYAZIDE) 37.5-25 MG capsule   simvastatin (ZOCOR) 40 MG tablet   Other Relevant Orders   Lipid panel   TSH   T4, free   Metastatic adenocarcinoma (HCC) - Primary   Other Visit Diagnoses     Needs flu shot       Relevant Orders   Flu Vaccine QUAD High Dose(Fluad)      Follow up with Oncology for treatments.   Flu Shot today  Future Shingles vaccine if interested  Yearly labs with Cholesterol and Thyroid.  Added refills for the Simvastatin and Triamterene-HCTZ BP med. For 1 year to [facility] mail order  For Left Hip Arthritis Reviewed X-ray  Recommend to start taking Tylenol Extra Strength '500mg'$  tabs - take 1 to 2 tabs per dose (max '1000mg'$ ) every 6-8 hours for pain (take regularly, don't skip a dose for next 7 days), max 24 hour daily dose is 6 tablets or '3000mg'$ . In the future you can repeat the same everyday Tylenol course for 1-2 weeks at a time.   Orders Placed This Encounter  Procedures   Flu Vaccine QUAD High Dose(Fluad)   Lipid panel    Order Specific Question:   Has the patient fasted?    Answer:   Yes  TSH   T4, free     Meds ordered this encounter  Medications   triamterene-hydrochlorothiazide (DYAZIDE) 37.5-25 MG capsule    Sig: Take 1 each (1 capsule total) by mouth daily.    Dispense:  100 capsule    Refill:  3    Please send a replace/new response with 100-Day Supply if appropriate to maximize member benefit. Requesting 1 year supply.   simvastatin (ZOCOR) 40 MG tablet    Sig: Take 1 tablet (40 mg total) by mouth at bedtime.    Dispense:  100 tablet    Refill:  3    Please send a replace/new response with 100-Day Supply if appropriate to maximize  member benefit. Requesting 1 year supply.     Follow up plan: Return in about 6 months (around 01/03/2023) for 6 month follow-up specialist updates.   Nobie Putnam, [facility] 07/05/2022, 10:18 AM

## 2022-07-07 LAB — LIPID PANEL
Cholesterol: 170 mg/dL (ref <200)
HDL: 75 mg/dL (ref >=50)
LDL Cholesterol (Calc): 77 mg/dL (calc)
Non-HDL Cholesterol (Calc): 95 mg/dL (calc) (ref <130)
Total CHOL/HDL Ratio: 2.3 (calc) (ref <5.0)
Triglycerides: 92 mg/dL (ref <150)

## 2022-07-07 LAB — TSH: TSH: 1.06 mIU/L (ref 0.40–4.50)

## 2022-07-07 LAB — T4, FREE: Free T4: 1.2 ng/dL (ref 0.8–1.8)

## 2022-07-07 MED FILL — Dexamethasone Sodium Phosphate Inj 100 MG/10ML: INTRAMUSCULAR | Qty: 1 | Status: AC

## 2022-07-09 ENCOUNTER — Other Ambulatory Visit: Payer: Self-pay | Admitting: Family Medicine

## 2022-07-09 DIAGNOSIS — J310 Chronic rhinitis: Secondary | ICD-10-CM

## 2022-07-10 ENCOUNTER — Inpatient Hospital Stay: Payer: Medicare Other

## 2022-07-10 ENCOUNTER — Inpatient Hospital Stay (HOSPITAL_BASED_OUTPATIENT_CLINIC_OR_DEPARTMENT_OTHER): Payer: Medicare Other | Admitting: Oncology

## 2022-07-10 ENCOUNTER — Encounter: Payer: Self-pay | Admitting: Oncology

## 2022-07-10 DIAGNOSIS — Z79899 Other long term (current) drug therapy: Secondary | ICD-10-CM | POA: Diagnosis not present

## 2022-07-10 DIAGNOSIS — M47816 Spondylosis without myelopathy or radiculopathy, lumbar region: Secondary | ICD-10-CM | POA: Diagnosis not present

## 2022-07-10 DIAGNOSIS — I10 Essential (primary) hypertension: Secondary | ICD-10-CM | POA: Diagnosis not present

## 2022-07-10 DIAGNOSIS — K219 Gastro-esophageal reflux disease without esophagitis: Secondary | ICD-10-CM | POA: Diagnosis not present

## 2022-07-10 DIAGNOSIS — N281 Cyst of kidney, acquired: Secondary | ICD-10-CM | POA: Diagnosis not present

## 2022-07-10 DIAGNOSIS — D701 Agranulocytosis secondary to cancer chemotherapy: Secondary | ICD-10-CM | POA: Diagnosis not present

## 2022-07-10 DIAGNOSIS — D6481 Anemia due to antineoplastic chemotherapy: Secondary | ICD-10-CM

## 2022-07-10 DIAGNOSIS — R1900 Intra-abdominal and pelvic swelling, mass and lump, unspecified site: Secondary | ICD-10-CM | POA: Diagnosis not present

## 2022-07-10 DIAGNOSIS — C7800 Secondary malignant neoplasm of unspecified lung: Secondary | ICD-10-CM | POA: Diagnosis not present

## 2022-07-10 DIAGNOSIS — C20 Malignant neoplasm of rectum: Secondary | ICD-10-CM

## 2022-07-10 DIAGNOSIS — T451X5A Adverse effect of antineoplastic and immunosuppressive drugs, initial encounter: Secondary | ICD-10-CM | POA: Diagnosis not present

## 2022-07-10 DIAGNOSIS — K838 Other specified diseases of biliary tract: Secondary | ICD-10-CM | POA: Diagnosis not present

## 2022-07-10 DIAGNOSIS — M25552 Pain in left hip: Secondary | ICD-10-CM | POA: Diagnosis not present

## 2022-07-10 DIAGNOSIS — R634 Abnormal weight loss: Secondary | ICD-10-CM | POA: Diagnosis not present

## 2022-07-10 DIAGNOSIS — Z933 Colostomy status: Secondary | ICD-10-CM | POA: Diagnosis not present

## 2022-07-10 DIAGNOSIS — I2510 Atherosclerotic heart disease of native coronary artery without angina pectoris: Secondary | ICD-10-CM | POA: Diagnosis not present

## 2022-07-10 DIAGNOSIS — C799 Secondary malignant neoplasm of unspecified site: Secondary | ICD-10-CM

## 2022-07-10 DIAGNOSIS — Z5111 Encounter for antineoplastic chemotherapy: Secondary | ICD-10-CM | POA: Diagnosis not present

## 2022-07-10 DIAGNOSIS — Z86718 Personal history of other venous thrombosis and embolism: Secondary | ICD-10-CM | POA: Diagnosis not present

## 2022-07-10 DIAGNOSIS — E876 Hypokalemia: Secondary | ICD-10-CM | POA: Diagnosis not present

## 2022-07-10 DIAGNOSIS — K5900 Constipation, unspecified: Secondary | ICD-10-CM | POA: Diagnosis not present

## 2022-07-10 DIAGNOSIS — Z86711 Personal history of pulmonary embolism: Secondary | ICD-10-CM

## 2022-07-10 DIAGNOSIS — K435 Parastomal hernia without obstruction or  gangrene: Secondary | ICD-10-CM | POA: Diagnosis not present

## 2022-07-10 DIAGNOSIS — Z5189 Encounter for other specified aftercare: Secondary | ICD-10-CM | POA: Diagnosis not present

## 2022-07-10 DIAGNOSIS — E7800 Pure hypercholesterolemia, unspecified: Secondary | ICD-10-CM | POA: Diagnosis not present

## 2022-07-10 LAB — COMPREHENSIVE METABOLIC PANEL
ALT: 16 U/L (ref 0–44)
AST: 27 U/L (ref 15–41)
Albumin: 3.9 g/dL (ref 3.5–5.0)
Alkaline Phosphatase: 74 U/L (ref 38–126)
Anion gap: 5 (ref 5–15)
BUN: 11 mg/dL (ref 8–23)
CO2: 28 mmol/L (ref 22–32)
Calcium: 9.2 mg/dL (ref 8.9–10.3)
Chloride: 104 mmol/L (ref 98–111)
Creatinine, Ser: 0.93 mg/dL (ref 0.44–1.00)
GFR, Estimated: >60 mL/min (ref >60)
Glucose, Bld: 95 mg/dL (ref 70–99)
Potassium: 4.1 mmol/L (ref 3.5–5.1)
Sodium: 137 mmol/L (ref 135–145)
Total Bilirubin: 0.3 mg/dL (ref 0.3–1.2)
Total Protein: 6.8 g/dL (ref 6.5–8.1)

## 2022-07-10 LAB — CBC WITH DIFFERENTIAL/PLATELET
Abs Immature Granulocytes: 0.05 10*3/uL (ref 0.00–0.07)
Basophils Absolute: 0 10*3/uL (ref 0.0–0.1)
Basophils Relative: 0 %
Eosinophils Absolute: 0.2 10*3/uL (ref 0.0–0.5)
Eosinophils Relative: 2 %
HCT: 38.2 % (ref 36.0–46.0)
Hemoglobin: 12.1 g/dL (ref 12.0–15.0)
Immature Granulocytes: 1 %
Lymphocytes Relative: 25 %
Lymphs Abs: 1.7 10*3/uL (ref 0.7–4.0)
MCH: 30.3 pg (ref 26.0–34.0)
MCHC: 31.7 g/dL (ref 30.0–36.0)
MCV: 95.5 fL (ref 80.0–100.0)
Monocytes Absolute: 0.6 10*3/uL (ref 0.1–1.0)
Monocytes Relative: 9 %
Neutro Abs: 4.2 10*3/uL (ref 1.7–7.7)
Neutrophils Relative %: 63 %
Platelets: 205 10*3/uL (ref 150–400)
RBC: 4 MIL/uL (ref 3.87–5.11)
RDW: 17.4 % — ABNORMAL HIGH (ref 11.5–15.5)
WBC: 6.7 10*3/uL (ref 4.0–10.5)
nRBC: 0 % (ref 0.0–0.2)

## 2022-07-10 LAB — PROTEIN, URINE, RANDOM: Total Protein, Urine: 19 mg/dL

## 2022-07-10 MED ORDER — POTASSIUM CHLORIDE CRYS ER 20 MEQ PO TBCR: 20.0000 meq | EXTENDED_RELEASE_TABLET | Freq: Two times a day (BID) | ORAL | 0 refills | Status: DC

## 2022-07-10 MED ORDER — SODIUM CHLORIDE 0.9 % IV SOLN
750.0000 mg | Freq: Once | INTRAVENOUS | Status: AC
  Administered 2022-07-10: 750 mg via INTRAVENOUS
  Filled 2022-07-10: qty 17.5

## 2022-07-10 MED ORDER — ATROPINE SULFATE 1 MG/ML IV SOLN
0.5000 mg | Freq: Once | INTRAVENOUS | Status: AC | PRN
  Administered 2022-07-10: 0.5 mg via INTRAVENOUS
  Filled 2022-07-10: qty 1

## 2022-07-10 MED ORDER — SODIUM CHLORIDE 0.9 % IV SOLN
5.0000 mg/kg | Freq: Once | INTRAVENOUS | Status: AC
  Administered 2022-07-10: 350 mg via INTRAVENOUS
  Filled 2022-07-10: qty 14

## 2022-07-10 MED ORDER — SODIUM CHLORIDE 0.9 % IV SOLN
Freq: Once | INTRAVENOUS | Status: AC
  Filled 2022-07-10: qty 250

## 2022-07-10 MED ORDER — PALONOSETRON HCL INJECTION 0.25 MG/5ML
0.2500 mg | Freq: Once | INTRAVENOUS | Status: AC
  Administered 2022-07-10: 0.25 mg via INTRAVENOUS
  Filled 2022-07-10: qty 5

## 2022-07-10 MED ORDER — SODIUM CHLORIDE 0.9 % IV SOLN
10.0000 mg | Freq: Once | INTRAVENOUS | Status: AC
  Administered 2022-07-10: 10 mg via INTRAVENOUS
  Filled 2022-07-10: qty 10

## 2022-07-10 MED ORDER — DIPHENHYDRAMINE HCL 50 MG/ML IJ SOLN
25.0000 mg | Freq: Every day | INTRAMUSCULAR | Status: DC | PRN
  Administered 2022-07-10: 25 mg via INTRAVENOUS
  Filled 2022-07-10: qty 1

## 2022-07-10 MED ORDER — SODIUM CHLORIDE 0.9 % IV SOLN
100.0000 mg/m2 | Freq: Once | INTRAVENOUS | Status: AC
  Administered 2022-07-10: 180 mg via INTRAVENOUS
  Filled 2022-07-10: qty 4

## 2022-07-10 MED ORDER — SODIUM CHLORIDE 0.9 % IV SOLN
2400.0000 mg/m2 | INTRAVENOUS | Status: DC
  Administered 2022-07-10: 4300 mg via INTRAVENOUS
  Filled 2022-07-10: qty 86

## 2022-07-10 NOTE — Assessment & Plan Note (Addendum)
History of stage IIIC Rectal cancer, s/p TNT, followed by 09/17/19 APR/posterior vaginectomy/TAH/BSO/VY-flap, pT4b pN0 with close vaginal margin 0.2 mm.  Uterus and ovaries negative for malignancy. palliative radiation to vaginal recurrence- 01/19/21 recurrence with lung metastasis.-Palliative -FOLFIRI plus bevacizumab.  Irinotecan was dropped in November 2022 due to side effects. Negative for UGT1A1*28 - radiographically stable, rise of CEA-July 2023 CT showed slightly progression of lung metastasis. Labs are reviewed and discussed with patient. CEA progressively increases Proceed with dose reduced FOLFIRI-[Irinotecan 100 mg/m2] / bevacizumab-currently she tolerates well Repeat CT, obtain bone scan.  RAS wild type. Consider switch to irinotecan +panitumumab upon significant radiographic progression .

## 2022-07-10 NOTE — Assessment & Plan Note (Signed)
Hb is stable. observation 

## 2022-07-10 NOTE — Progress Notes (Signed)
Hematology/Oncology Progress note [phone] [phone]      Patient Care Team: Olin Hauser, DO as PCP - General (Family Medicine) Clent Jacks, RN as Registered Nurse Earlie Server, MD as Consulting Physician (Hematology and Oncology) Cammie Sickle, MD as Consulting Physician (Oncology)  ASSESSMENT & PLAN:   Cancer Staging  Rectal cancer [facility]) Staging form: Colon and Rectum, AJCC 8th Edition - Pathologic stage from 10/06/2019: Stage IIC (ypT4b, pN0, cM0) - Signed by Earlie Server, MD on 10/06/2019 - Clinical stage from 06/30/2020: Jacqueline Yoder - Signed by Earlie Server, MD on 04/17/2022 - Pathologic: Stage Unknown (rpTX, pNX, cM1) - Signed by Earlie Server, MD on 01/31/2021   Rectal cancer [facility]) History of stage IIIC Rectal cancer, s/p TNT, followed by 09/17/19 APR/posterior vaginectomy/TAH/BSO/VY-flap, pT4b pN0 with close vaginal margin 0.2 mm.  Uterus and ovaries negative for malignancy. palliative radiation to vaginal recurrence- 01/19/21 recurrence with lung metastasis.-Palliative -FOLFIRI plus bevacizumab.  Irinotecan was dropped in November 2022 due to side effects. Negative for UGT1A1*28 - radiographically stable, rise of CEA-July 2023 CT showed slightly progression of lung metastasis. Labs are reviewed and discussed with patient. CEA progressively increases Proceed with dose reduced FOLFIRI-[Irinotecan 100 mg/m2] / bevacizumab-currently she tolerates well Repeat CT, obtain bone scan.  RAS wild type. Consider switch to irinotecan +panitumumab upon significant radiographic progression .  Encounter for antineoplastic chemotherapy Chemotherapy plan as listed above.   History of pulmonary embolism Continue  Eliquis 2.5 mg twice daily for anticoagulation prophylaxis.  Hypokalemia  potassium is stable.  Continue potassium chloride 40 mEq  daily.   Chemotherapy induced neutropenia (HCC)  patient will receive long-acting GCSF   Ziextenzo prophylaxis on  D3  Anemia due to antineoplastic chemotherapy Hb is stable. observation   Follow up in 2 weeks. Lab  MD next cycle of chemo. All questions were answered. The patient knows to call the clinic with any problems, questions or concerns.  Earlie Server, MD, PhD [facility] Hematology Oncology 07/10/2022        CHIEF COMPLAINTS/REASON FOR VISIT:  Follow up for rectal cancer  HISTORY OF PRESENTING ILLNESS:  Patient initially presented with complaints of postmenopausal bleeding on 08/16/2018.  History of was menopausal vaginal bleeding in 2016 which resulted in cervical polypectomy.  Pathology 02/04/2015 showed cervical polyp, consistent with benign endometrial polyp.  [name] after polypectomy due to anxiety associated with pelvic exams.  pelvic exam on 08/16/2018 reviewed cervical abnormality and from enlarged uterus. Seen by Dr. Marcelline Mates on 10/29/2018.  Endometrial biopsy and a Pap smear was performed. 10/29/2018 Pap smear showed adenocarcinoma, favor endometrial origin. 10/29/2018 endometrial biopsy showed endometrioid carcinoma, FIGO grade 1.  10/29/2018- TA & TV Ultrasound revealed: Anteverted uterus measuring 8.7 x 5.6 x 6.4 cm without evidence of focal masses.  The endometrium measuring 24.1 mm (thickened) and heterogeneous.  Right and left ovaries not visualized.  No adnexal masses identified.  No free fluid in cul-de-sac.  Patient was seen by Dr. Theora Gianotti in clinic on 11/13/2018.  Cervical exam reveals 2 cm exophytic irregular mass consistent with malignancy.   11/19/2018 CT chest abdomen pelvis with contrast showed thickened endometrium with some irregularity compatible with the provided diagnosis of endometrial malignancy.  There is a mildly prominent left inguinal node 1.4 cm.  Patient was seen by Dr. Fransisca Connors on 11/20/2018 and left groin lymph node biopsy was recommended.  11/26/2018 patient underwent left inguinal lymph node biopsy. Pathology showed metastatic adenocarcinoma  consistent with colorectal origin.  CDX  2+.  Case was discussed on tumor board.  Recommend colonoscopy for further evaluation.  Patient reports significant weight loss 30 pounds over the last year.  Chronic vaginal spotting. Change of bowel habits the past few months.  More constipated.  Family history positive for brother who has colon cancer prostate cancer.  patient has underwent colonoscopy on 12/03/2018 which reviewed a nonobstructing large mass in the rectum.  Also chronic fistula.  Mass was not circumferential.  This was biopsied with a cold forceps for histology.  Pathology came back hyperplastic polyp negative for dysplasia and malignancy. [name], patient underwent flex sigmoidoscopy on 12/06/2018 with rebiopsy of the rectal mass. This time biopsy results came back positive for invasive colorectal adenocarcinoma, moderately differentiated. Immunotherapy for nearly mismatch repair protein (MMR ) was performed.  There is no loss of MMR expression.  low probability of MSI high.   # Seen by [name] for evaluation of resectability for rectal cancer. In addition, she also had a second opinion with [facility] where her endometrial biopsy pathology was changed to  adenocarcinoma, consistent with colorectal primary.   [name]. She has home health that has been assisting with ostomy care  Patient was also evaluated by [facility] oncology.  Recommendation is to proceed with TNT with concurrent chemoradiation followed by neoadjuvant chemotherapy followed by surgical resection. Patient prefers to have treatment done locally with [facility].   # Oncology Treatment:  02/03/2019- 03/19/2019  concurrent Xeloda and radiation.  Xeloda dose 848m /m2 BID - rounded to 16585mBID- on days of radiation. 04/09/2019, started on FOLFOX with bolus early.  Omitted.  07/16/2019 finished 8 cycles of FOLFOX. 09/17/19 APR/posterior  vaginectomy/TAH/BSO/VY-flap pT4b pN0 with close vaginal margin 0.2 mm.  Uterus and ovaries negative for malignancy. Patient reports bilateral lower extremity numbness and tingling, intermittent, left worse than right. She has lost a lot of weight since her APR surgery.   #Family history with half brother having's history of colon cancer prostate cancer.  Personal history of colorectal cancer.  Patient has not decided if she wants genetic testing.    # history of PE( 01/13/2020)  in the bilateral lower extremity DVT (01/13/2020).   She finishes 6 months of anticoagulation with Eliquis 5 mg twice daily. Now switched to Eliquis 2.5 mg twice daily..  # She has now developed recurrent disease. #06/30/20  vaginal introitus mass biopsied. Pathology is consistent with metastatic colorectal adenocarcinoma I have discussed with [name]  Dr. MaHester Matesnd the mass is not resectable. Patient has also had colonoscopy by Dr. AnVicente Malesesterday. Normal examination. # 07/16/2020 cycle 1 FOLFIRI  # 07/20/2020 PET scan was done for further evaluation, images are consistent with local recurrence, no distant metastasis. #Discussed with radiation oncology Dr. ChBaruch Goutyill recommends concurrent chemotherapy and radiation. 08/02/2020-08/16/2020, patient starts radiation.  Xeloda was held due to neutropenia 08/17/2020,-09/06/2020 Xeloda 1500 mg twice daily concurrently with radiation  01/31/21 started on FOLFIRI + Bev 05/18/2021 CT chest abdomen pelvis showed Previously noted enlargement of bilateral inguinal lymph nodes is resolved, consistent with treatment response of nodal metastatic disease. Interval decrease in size of multiple small bilateral pulmonary nodules, consistent with treatment response of pulmonary metastatic disease. No evidence of new metastatic disease. 05/24/2021 - 08/30/2021, continued on FOLFIRI plus bevacizumab.  Irinotecan dose was reduced, eventually 10084m2  09/02/2021, CT chest abdomen pelvis without  contrast Showed small bilateral pulmonary nodules, unchanged.  Stable metastatic disease.  No noncontrast evidence of new  metastatic disease in the chest abdomen pelvis.  Small parastomal hernia.  Enlargement of main pulmonary artery.  Coronary artery disease.  09/13/2021, maintenance 5-FU/bevacizumab 11/28/2021, 5-FU/Irinotecan/bevacizumab.  Irinotecan 100 mg/m2 was added back due to progressively increasing CEA.  INTERVAL HISTORY Jacqueline Yoder is a 76 y.o. female who has above history reviewed by me presents for follow-up of rectal cancer. Currently on dose reduced FOLFIRI/bevacizumab  overall she tolerates well.  Chronic diarrhea is manageable and stable symptoms. Improved with antidiarrhea PRN.  She was accompanied by her daughter today.   No new complaints today  Review of Systems  Constitutional:  Positive for fatigue. Negative for appetite change, chills, fever and unexpected weight change.  HENT:   Negative for hearing loss and voice change.   Eyes:  Negative for eye problems.  Respiratory:  Negative for chest tightness and cough.   Cardiovascular:  Negative for chest pain.  Gastrointestinal:  Negative for abdominal distention, abdominal pain, blood in stool, constipation, diarrhea and nausea.  Endocrine: Negative for hot flashes.  Genitourinary:  Negative for difficulty urinating and frequency.   Musculoskeletal:  Negative for arthralgias.  Skin:  Negative for itching and rash.  Neurological:  Negative for extremity weakness and numbness.  Hematological:  Negative for adenopathy.  Psychiatric/Behavioral:  Negative for confusion.     MEDICAL HISTORY:  Past Medical History:  Diagnosis Date   Allergy    Arthritis    Blood clot in vein    Family history of colon cancer    GERD (gastroesophageal reflux disease)    Hypercholesteremia    Hypertension    Hypertension    Lower extremity edema    Personal history of chemotherapy    Rectal cancer ([place]) 12/2018   Urinary  incontinence     SURGICAL HISTORY: Past Surgical History:  Procedure Laterality Date   ABDOMINAL HYSTERECTOMY     CHOLECYSTECTOMY  1971   COLONOSCOPY WITH PROPOFOL N/A 12/03/2018   Procedure: COLONOSCOPY WITH PROPOFOL;  Surgeon: Lucilla Lame, MD;  Location: [facility] ENDOSCOPY;  Service: Endoscopy;  Laterality: N/A;   COLONOSCOPY WITH PROPOFOL N/A 07/15/2020   Procedure: COLONOSCOPY WITH PROPOFOL;  Surgeon: [name], MD;  Location: [facility] ENDOSCOPY;  Service: Gastroenterology;  Laterality: N/A;   FLEXIBLE SIGMOIDOSCOPY N/A 12/06/2018   Procedure: FLEXIBLE SIGMOIDOSCOPY;  Surgeon: [name], MD;  Location: [facility] ENDOSCOPY;  Service: Endoscopy;  Laterality: N/A;   LAPAROSCOPIC COLOSTOMY  01/06/2019   PORTACATH PLACEMENT N/A 04/03/2019   Procedure: INSERTION PORT-A-CATH;  Surgeon: Jules Husbands, MD;  Location: [facility] ORS;  Service: General;  Laterality: N/A;    SOCIAL HISTORY: Social History   Socioeconomic History   Marital status: Widowed    Spouse name: Not on file   Number of children: Not on file   Years of education: Not on file   Highest education level: Not on file  Occupational History   Not on file  Tobacco Use   Smoking status: Former    Types: Cigarettes    Quit date: 12/02/1977    Years since quitting: 44.6   Smokeless tobacco: Former  Scientific laboratory technician Use: Never used  Substance and Sexual Activity   Alcohol use: Never   Drug use: Never   Sexual activity: Not Currently    Birth control/protection: None  Other Topics Concern   Not on file  Social History Narrative   Lives with daughter   Social Determinants of Health   Financial Resource Strain: Medium Risk (06/30/2022)   Overall Financial Resource  Strain (CARDIA)    Difficulty of Paying Living Expenses: Somewhat hard  Food Insecurity: No Food Insecurity (06/30/2022)   Hunger Vital Sign    Worried About Running Out of Food in the Last Year: Never true    Ran Out of Food in the Last Year: Never true   Transportation Needs: No Transportation Needs (06/30/2022)   PRAPARE - Hydrologist (Medical): No    Lack of Transportation (Non-Medical): No  Physical Activity: Inactive (06/30/2022)   Exercise Vital Sign    Days of Exercise per Week: 0 days    Minutes of Exercise per Session: 0 min  Stress: No Stress Concern Present (06/30/2022)   [place]    Feeling of Stress : Not at all  Social Connections: Moderately Isolated (06/30/2022)   Social Connection and Isolation Panel [NHANES]    Frequency of Communication with Friends and Family: More than three times a week    Frequency of Social Gatherings with Friends and Family: Twice a week    Attends Religious Services: More than 4 times per year    Active Member of [employer] or Organizations: No    Attends Archivist Meetings: Never    Marital Status: Widowed  Intimate Partner Violence: Not At Risk (06/30/2022)   Humiliation, Afraid, Rape, and Kick questionnaire    Fear of Current or Ex-Partner: No    Emotionally Abused: No    Physically Abused: No    Sexually Abused: No    FAMILY HISTORY: Family History  Problem Relation Age of Onset   Colon cancer Brother 47       exposure to chemicals [place]   Hypertension Mother    Stroke Mother    Kidney failure Father    Breast cancer Neg Hx    Ovarian cancer Neg Hx     ALLERGIES:  is allergic to sulfamethoxazole-trimethoprim.  MEDICATIONS:  Current Outpatient Medications  Medication Sig Dispense Refill   Cholecalciferol (VITAMIN D3) 2000 units capsule Take 2,000 Units by mouth daily.     cholestyramine (QUESTRAN) 4 g packet Take 1 packet (4 g total) by mouth 3 (three) times daily. 90 each 1   diclofenac sodium (VOLTAREN) 1 % GEL Apply 2 g topically 4 (four) times daily as needed (joint pain).  11   diphenoxylate-atropine (LOMOTIL) 2.5-0.025 MG tablet Take 1 tablet by mouth 4 (four) times  daily as needed for diarrhea or loose stools. 30 tablet 1   ELIQUIS 2.5 MG TABS tablet TAKE 1 TABLET BY MOUTH TWICE  DAILY 60 tablet 11   fluticasone (FLONASE) 50 MCG/ACT nasal spray USE 1 SPRAY IN EACH NOSTRIL ONCE DAILY 16 g 3   gabapentin (NEURONTIN) 100 MG capsule Take 1 capsule (100 mg total) by mouth at bedtime. 30 capsule 0   ipratropium (ATROVENT) 0.03 % nasal spray Place 1 spray into both nostrils 2 (two) times daily. 30 mL 2   lidocaine-prilocaine (EMLA) cream Apply 1 application. topically as needed. 30 g 6   loperamide (IMODIUM) 2 MG capsule Take 1 capsule (2 mg total) by mouth See admin instructions. With onset of loose stool, take 11m followed by 253mevery 2 hours,  Maximum: 16 mg/day 120 capsule 1   loratadine (CLARITIN) 10 MG tablet Take 10 mg by mouth daily.     Multiple Vitamins-Minerals (ONE-A-DAY WOMENS 50 PLUS PO) Take 1 tablet by mouth daily.      potassium chloride SA (KLOR-CON M) 20  MEQ tablet Take 2 tablets (40 mEq total) by mouth daily. 60 tablet 5   potassium chloride SA (KLOR-CON M) 20 MEQ tablet Take 1 tablet (20 mEq total) by mouth 2 (two) times daily. 20 tablet 0   simvastatin (ZOCOR) 40 MG tablet Take 1 tablet (40 mg total) by mouth at bedtime. 100 tablet 3   triamterene-hydrochlorothiazide (DYAZIDE) 37.5-25 MG capsule Take 1 each (1 capsule total) by mouth daily. 100 capsule 3   zinc gluconate 50 MG tablet Take 50 mg by mouth daily.     No current facility-administered medications for this visit.   Facility-Administered Medications Ordered in Other Visits  Medication Dose Route Frequency Provider Last Rate Last Admin   diphenhydrAMINE (BENADRYL) injection 25 mg  25 mg Intravenous Daily PRN Earlie Server, MD   25 mg at 06/26/22 1405     PHYSICAL EXAMINATION: ECOG PERFORMANCE STATUS: 1 - Symptomatic but completely ambulatory  Physical Exam Constitutional:      General: She is not in acute distress. HENT:     Head: Normocephalic and atraumatic.  Eyes:      General: No scleral icterus. Cardiovascular:     Rate and Rhythm: Normal rate and regular rhythm.     Heart sounds: Normal heart sounds.  Pulmonary:     Effort: Pulmonary effort is normal. No respiratory distress.     Breath sounds: No wheezing.  Abdominal:     General: Bowel sounds are normal. There is no distension.     Palpations: Abdomen is soft.     Comments: + Colostomy bag   Musculoskeletal:        General: No deformity. Normal range of motion.     Cervical back: Normal range of motion and neck supple.     Comments: Right lower extremity subcutaneous cystic nodule, mobile, non tender  Skin:    General: Skin is warm and dry.     Findings: No erythema or rash.  Neurological:     Mental Status: She is alert and oriented to person, place, and time. Mental status is at baseline.     Cranial Nerves: No cranial nerve deficit.     Coordination: Coordination normal.       LABORATORY DATA:  I have reviewed the data as listed Lab Results  Component Value Date   WBC 6.7 07/10/2022   HGB 12.1 07/10/2022   HCT 38.2 07/10/2022   MCV 95.5 07/10/2022   PLT 205 07/10/2022   Recent Labs    06/12/22 0843 06/26/22 0836 07/10/22 0849  NA 138 139 137  K 4.1 4.2 4.1  CL 105 106 104  CO2 '28 27 28  ' GLUCOSE 97 97 95  BUN '12 14 11  ' CREATININE 0.84 0.83 0.93  CALCIUM 9.1 9.3 9.2  GFRNONAA >60 >60 >60  PROT 6.6 6.7 6.8  ALBUMIN 3.9 3.9 3.9  AST '26 28 27  ' ALT '16 15 16  ' ALKPHOS 78 73 74  BILITOT 0.5 0.4 0.3    Iron/TIBC/Ferritin/ %Sat    Component Value Date/Time   IRON 40 06/23/2020 1124   TIBC 246 (L) 06/23/2020 1124   FERRITIN 338 (H) 06/23/2020 1124   IRONPCTSAT 16 06/23/2020 1124     RADIOGRAPHIC STUDIES: I have personally reviewed the radiological images as listed and agreed with the findings in the report. DG HIP UNILAT W OR W/O PELVIS 2-3 VIEWS LEFT  Result Date: 06/30/2022 CLINICAL DATA:  Left hip pain. EXAM: DG HIP (WITH OR WITHOUT PELVIS) 2-3V LEFT  COMPARISON:  None Available. FINDINGS: Both hips are normally located. Mild degenerative changes. No acute bony findings and no plain film evidence of AVN. The pubic symphysis and SI joints are intact. No pelvic fractures or bone lesions. IMPRESSION: Mild degenerative changes but no acute bony findings. Electronically Signed   By: Marijo Sanes M.D.   On: 06/30/2022 09:07   CT CHEST ABDOMEN PELVIS W CONTRAST  Result Date: 04/26/2022 CLINICAL DATA:  Patient with history of rectal carcinoma. On chemotherapy. * Tracking Code: BO * EXAM: CT CHEST, ABDOMEN, AND PELVIS WITH CONTRAST TECHNIQUE: Multidetector CT imaging of the chest, abdomen and pelvis was performed following the standard protocol during bolus administration of intravenous contrast. RADIATION DOSE REDUCTION: This exam was performed according to the departmental dose-optimization program which includes automated exposure control, adjustment of the mA and/or kV according to patient size and/or use of iterative reconstruction technique. CONTRAST:  171m OMNIPAQUE IOHEXOL 300 MG/ML  SOLN COMPARISON:  CT C AP January 20, 2022 FINDINGS: CT CHEST FINDINGS Cardiovascular: Right anterior chest wall Port-A-Cath is present with tip terminating in the superior vena cava. Normal heart size. Trace fluid superior pericardial recess. Thoracic aortic vascular calcifications. Mediastinum/Nodes: No enlarged axillary, mediastinal or hilar lymphadenopathy. Normal appearance of the esophagus. Lungs/Pleura: Central airways are patent. Dependent atelectasis within the lower lobes bilaterally. Stable 4 mm left upper lobe nodule (image 47; series 3). Stable 3 mm left upper lobe nodule (image 28; series 3). Biapical pleuroparenchymal thickening/scarring. Mild interval increase in size of 3 mm right upper lobe nodule (image 41; series 3), previously 2 mm. Interval increase in size of 8 mm right middle lobe nodule (image 104; series 3), previously 5 mm. Slight interval increase in  size of 7 mm right middle lobe nodule (image 100; series 3), previously 5 mm. No pleural effusion or pneumothorax. Musculoskeletal: No aggressive or acute appearing osseous lesions. CT ABDOMEN PELVIS FINDINGS Hepatobiliary: Liver is normal in size and contour. No focal hepatic lesion identified. Stable biliary ductal dilatation. Pancreas: Unremarkable Spleen: Unremarkable Adrenals/Urinary Tract: Normal adrenal glands. Kidneys enhance symmetrically with contrast. Bilateral renal cysts redemonstrated. No hydronephrosis. Urinary bladder is unremarkable. Stomach/Bowel: Patient is status post APR with left colostomy. No evidence for bowel obstruction. No free fluid or free intraperitoneal air. Normal morphology the stomach. Redemonstrated pelvic floor laxity and associated bulging of small bowel. Vascular/Lymphatic: Normal caliber abdominal aorta. Peripheral calcified atherosclerotic plaque. No retroperitoneal lymphadenopathy. Reproductive: Prior hysterectomy.  Pelvic masses. Other: None. Musculoskeletal: No aggressive or acute appearing osseous lesions. Lumbar spine degenerative changes. IMPRESSION: 1. Slight interval increase in size of a few of the scattered pulmonary nodules. Other nodules are stable. 2. Similar biliary ductal dilatation. 3. Prior APR with left colostomy, similar to prior. Electronically Signed   By: DLovey NewcomerM.D.   On: 04/26/2022 17:23      DG HIP UNILAT W OR W/O PELVIS 2-3 VIEWS LEFT  Result Date: 06/30/2022 CLINICAL DATA:  Left hip pain. EXAM: DG HIP (WITH OR WITHOUT PELVIS) 2-3V LEFT COMPARISON:  None Available. FINDINGS: Both hips are normally located. Mild degenerative changes. No acute bony findings and no plain film evidence of AVN. The pubic symphysis and SI joints are intact. No pelvic fractures or bone lesions. IMPRESSION: Mild degenerative changes but no acute bony findings. Electronically Signed   By: PMarijo SanesM.D.   On: 06/30/2022 09:07   CT CHEST ABDOMEN PELVIS W  CONTRAST  Result Date: 04/26/2022 CLINICAL DATA:  Patient with history of rectal carcinoma. On chemotherapy. * Tracking  Code: BO * EXAM: CT CHEST, ABDOMEN, AND PELVIS WITH CONTRAST TECHNIQUE: Multidetector CT imaging of the chest, abdomen and pelvis was performed following the standard protocol during bolus administration of intravenous contrast. RADIATION DOSE REDUCTION: This exam was performed according to the departmental dose-optimization program which includes automated exposure control, adjustment of the mA and/or kV according to patient size and/or use of iterative reconstruction technique. CONTRAST:  134m OMNIPAQUE IOHEXOL 300 MG/ML  SOLN COMPARISON:  CT C AP January 20, 2022 FINDINGS: CT CHEST FINDINGS Cardiovascular: Right anterior chest wall Port-A-Cath is present with tip terminating in the superior vena cava. Normal heart size. Trace fluid superior pericardial recess. Thoracic aortic vascular calcifications. Mediastinum/Nodes: No enlarged axillary, mediastinal or hilar lymphadenopathy. Normal appearance of the esophagus. Lungs/Pleura: Central airways are patent. Dependent atelectasis within the lower lobes bilaterally. Stable 4 mm left upper lobe nodule (image 47; series 3). Stable 3 mm left upper lobe nodule (image 28; series 3). Biapical pleuroparenchymal thickening/scarring. Mild interval increase in size of 3 mm right upper lobe nodule (image 41; series 3), previously 2 mm. Interval increase in size of 8 mm right middle lobe nodule (image 104; series 3), previously 5 mm. Slight interval increase in size of 7 mm right middle lobe nodule (image 100; series 3), previously 5 mm. No pleural effusion or pneumothorax. Musculoskeletal: No aggressive or acute appearing osseous lesions. CT ABDOMEN PELVIS FINDINGS Hepatobiliary: Liver is normal in size and contour. No focal hepatic lesion identified. Stable biliary ductal dilatation. Pancreas: Unremarkable Spleen: Unremarkable Adrenals/Urinary Tract: Normal  adrenal glands. Kidneys enhance symmetrically with contrast. Bilateral renal cysts redemonstrated. No hydronephrosis. Urinary bladder is unremarkable. Stomach/Bowel: Patient is status post APR with left colostomy. No evidence for bowel obstruction. No free fluid or free intraperitoneal air. Normal morphology the stomach. Redemonstrated pelvic floor laxity and associated bulging of small bowel. Vascular/Lymphatic: Normal caliber abdominal aorta. Peripheral calcified atherosclerotic plaque. No retroperitoneal lymphadenopathy. Reproductive: Prior hysterectomy.  Pelvic masses. Other: None. Musculoskeletal: No aggressive or acute appearing osseous lesions. Lumbar spine degenerative changes. IMPRESSION: 1. Slight interval increase in size of a few of the scattered pulmonary nodules. Other nodules are stable. 2. Similar biliary ductal dilatation. 3. Prior APR with left colostomy, similar to prior. Electronically Signed   By: DLovey NewcomerM.D.   On: 04/26/2022 17:23

## 2022-07-10 NOTE — Assessment & Plan Note (Signed)
Chemotherapy plan as listed above 

## 2022-07-10 NOTE — Assessment & Plan Note (Signed)
Continue  Eliquis 2.5 mg twice daily for anticoagulation prophylaxis. 

## 2022-07-10 NOTE — Assessment & Plan Note (Signed)
patient will receive long-acting GCSF   Ziextenzo prophylaxis on D3 

## 2022-07-10 NOTE — Patient Instructions (Signed)
[facility] CANCER CTR AT [place]  Discharge Instructions: Thank you for choosing [facility] to provide your oncology and hematology care.  If you have a lab appointment with the [facility], please go directly to the [facility] and check in at the registration area.  Wear comfortable clothing and clothing appropriate for easy access to any Portacath or PICC line.   We strive to give you quality time with your provider. You may need to reschedule your appointment if you arrive late (15 or more minutes).  Arriving late affects you and other patients whose appointments are after yours.  Also, if you miss three or more appointments without notifying the office, you may be dismissed from the clinic at the provider's discretion.      For prescription refill requests, have your pharmacy contact our office and allow 72 hours for refills to be completed.    Today you received the following chemotherapy and/or immunotherapy agents:  Irinotecan, Adrucil, Mvasi      To help prevent nausea and vomiting after your treatment, we encourage you to take your nausea medication as directed.  BELOW ARE SYMPTOMS THAT SHOULD BE REPORTED IMMEDIATELY: *FEVER GREATER THAN 100.4 F (38 C) OR HIGHER *CHILLS OR SWEATING *NAUSEA AND VOMITING THAT IS NOT CONTROLLED WITH YOUR NAUSEA MEDICATION *UNUSUAL SHORTNESS OF BREATH *UNUSUAL BRUISING OR BLEEDING *URINARY PROBLEMS (pain or burning when urinating, or frequent urination) *BOWEL PROBLEMS (unusual diarrhea, constipation, pain near the anus) TENDERNESS IN MOUTH AND THROAT WITH OR WITHOUT PRESENCE OF ULCERS (sore throat, sores in mouth, or a toothache) UNUSUAL RASH, SWELLING OR PAIN  UNUSUAL VAGINAL DISCHARGE OR ITCHING   Items with * indicate a potential emergency and should be followed up as soon as possible or go to the Emergency Department if any problems should occur.  Please show the CHEMOTHERAPY ALERT CARD or IMMUNOTHERAPY ALERT  CARD at check-in to the Emergency Department and triage nurse.  Should you have questions after your visit or need to cancel or reschedule your appointment, please contact [facility] CANCER [place] AT [place]  [phone] and follow the prompts.  Office hours are 8:00 a.m. to 4:30 p.m. Monday - Friday. Please note that voicemails left after 4:00 p.m. may not be returned until the following business day.  We are closed weekends and major holidays. You have access to a nurse at all times for urgent questions. Please call the main number to the clinic [phone] and follow the prompts.  For any non-urgent questions, you may also contact your provider using MyChart. We now offer e-Visits for anyone 53 and older to request care online for non-urgent symptoms. For details visit [email or web address].   Also download the MyChart app! Go to the app store, search "MyChart", open the app, select [place], and log in with your MyChart username and password.  Masks are optional in the cancer centers. If you would like for your care team to wear a mask while they are taking care of you, please let them know. For doctor visits, patients may have with them one support person who is at least 75 years old. At this time, visitors are not allowed in the infusion area.

## 2022-07-10 NOTE — Assessment & Plan Note (Signed)
potassium is stable.  Continue potassium chloride 40 mEq  daily.  

## 2022-07-10 NOTE — Telephone Encounter (Signed)
Requested medication (s) are due for refill today: yes  Requested medication (s) are on the active medication list: yes  Last refill:  03/03/22 #30 with 2 RF  Future visit scheduled: 01/03/23, seen 07/05/22  Notes to clinic:  pharm requesting year supply, no protocol for this med, please assess.       Requested Prescriptions  Pending Prescriptions Disp Refills   ipratropium (ATROVENT) 0.03 % nasal spray [Pharmacy Med Name: Ipratropium Bromide 0.03 % Nasal Solution] 30 mL 3    Sig: USE 1 SPRAY IN BOTH NOSTRILS  TWICE DAILY     Off-Protocol Failed - 07/09/2022 10:18 PM      Failed - Medication not assigned to a protocol, review manually.      Passed - Valid encounter within last 12 months    Recent Outpatient Visits           5 days ago Metastatic adenocarcinoma [facility])   [facility] Olin Hauser, DO   6 months ago Metastatic adenocarcinoma [facility])   [facility] [place], Devonne Doughty, DO   1 year ago Annual physical exam   [facility] Olin Hauser, DO   1 year ago Benign hypertension with CKD (chronic kidney disease), stage II   [place], DO   1 year ago Overgrown toenails   [place], DO       Future Appointments             In 5 months [place], Devonne Doughty, DO [facility], [facility]           Off-Protocol Failed - 07/09/2022 10:18 PM      Failed - Medication not assigned to a protocol, review manually.      Passed - Valid encounter within last 12 months    Recent Outpatient Visits           5 days ago Metastatic adenocarcinoma [facility])   [place], DO   6 months ago Metastatic adenocarcinoma [facility])   [place], DO   1 year ago Annual physical exam   [facility] Olin Hauser, DO   1  year ago Benign hypertension with CKD (chronic kidney disease), stage II   [facility] [place], Devonne Doughty, DO   1 year ago [place], DO       Future Appointments             In 5 months [place], Devonne Doughty, DO [facility], [facility]

## 2022-07-12 ENCOUNTER — Inpatient Hospital Stay: Payer: Medicare Other

## 2022-07-12 DIAGNOSIS — I10 Essential (primary) hypertension: Secondary | ICD-10-CM | POA: Diagnosis not present

## 2022-07-12 DIAGNOSIS — E7800 Pure hypercholesterolemia, unspecified: Secondary | ICD-10-CM | POA: Diagnosis not present

## 2022-07-12 DIAGNOSIS — R1900 Intra-abdominal and pelvic swelling, mass and lump, unspecified site: Secondary | ICD-10-CM | POA: Diagnosis not present

## 2022-07-12 DIAGNOSIS — Z5189 Encounter for other specified aftercare: Secondary | ICD-10-CM | POA: Diagnosis not present

## 2022-07-12 DIAGNOSIS — Z86718 Personal history of other venous thrombosis and embolism: Secondary | ICD-10-CM | POA: Diagnosis not present

## 2022-07-12 DIAGNOSIS — C7800 Secondary malignant neoplasm of unspecified lung: Secondary | ICD-10-CM | POA: Diagnosis not present

## 2022-07-12 DIAGNOSIS — R197 Diarrhea, unspecified: Secondary | ICD-10-CM

## 2022-07-12 DIAGNOSIS — K838 Other specified diseases of biliary tract: Secondary | ICD-10-CM | POA: Diagnosis not present

## 2022-07-12 DIAGNOSIS — D701 Agranulocytosis secondary to cancer chemotherapy: Secondary | ICD-10-CM | POA: Diagnosis not present

## 2022-07-12 DIAGNOSIS — C799 Secondary malignant neoplasm of unspecified site: Secondary | ICD-10-CM

## 2022-07-12 DIAGNOSIS — M47816 Spondylosis without myelopathy or radiculopathy, lumbar region: Secondary | ICD-10-CM | POA: Diagnosis not present

## 2022-07-12 DIAGNOSIS — R634 Abnormal weight loss: Secondary | ICD-10-CM | POA: Diagnosis not present

## 2022-07-12 DIAGNOSIS — C20 Malignant neoplasm of rectum: Secondary | ICD-10-CM

## 2022-07-12 DIAGNOSIS — E876 Hypokalemia: Secondary | ICD-10-CM | POA: Diagnosis not present

## 2022-07-12 DIAGNOSIS — K5900 Constipation, unspecified: Secondary | ICD-10-CM | POA: Diagnosis not present

## 2022-07-12 DIAGNOSIS — M25552 Pain in left hip: Secondary | ICD-10-CM | POA: Diagnosis not present

## 2022-07-12 DIAGNOSIS — K435 Parastomal hernia without obstruction or  gangrene: Secondary | ICD-10-CM | POA: Diagnosis not present

## 2022-07-12 DIAGNOSIS — Z86711 Personal history of pulmonary embolism: Secondary | ICD-10-CM | POA: Diagnosis not present

## 2022-07-12 DIAGNOSIS — Z79899 Other long term (current) drug therapy: Secondary | ICD-10-CM | POA: Diagnosis not present

## 2022-07-12 DIAGNOSIS — D6481 Anemia due to antineoplastic chemotherapy: Secondary | ICD-10-CM | POA: Diagnosis not present

## 2022-07-12 DIAGNOSIS — T451X5A Adverse effect of antineoplastic and immunosuppressive drugs, initial encounter: Secondary | ICD-10-CM | POA: Diagnosis not present

## 2022-07-12 DIAGNOSIS — Z933 Colostomy status: Secondary | ICD-10-CM | POA: Diagnosis not present

## 2022-07-12 DIAGNOSIS — I2510 Atherosclerotic heart disease of native coronary artery without angina pectoris: Secondary | ICD-10-CM | POA: Diagnosis not present

## 2022-07-12 DIAGNOSIS — Z5111 Encounter for antineoplastic chemotherapy: Secondary | ICD-10-CM | POA: Diagnosis not present

## 2022-07-12 DIAGNOSIS — K219 Gastro-esophageal reflux disease without esophagitis: Secondary | ICD-10-CM | POA: Diagnosis not present

## 2022-07-12 DIAGNOSIS — N281 Cyst of kidney, acquired: Secondary | ICD-10-CM | POA: Diagnosis not present

## 2022-07-12 LAB — CEA: CEA: 1896 ng/mL — ABNORMAL HIGH (ref 0.0–4.7)

## 2022-07-12 MED ORDER — HEPARIN SOD (PORK) LOCK FLUSH 100 UNIT/ML IV SOLN
500.0000 [IU] | Freq: Once | INTRAVENOUS | Status: AC | PRN
  Administered 2022-07-12: 500 [IU]
  Filled 2022-07-12: qty 5

## 2022-07-12 MED ORDER — SODIUM CHLORIDE 0.9% FLUSH
10.0000 mL | INTRAVENOUS | Status: DC | PRN
  Administered 2022-07-12: 10 mL
  Filled 2022-07-12: qty 10

## 2022-07-12 MED ORDER — PEGFILGRASTIM INJECTION 6 MG/0.6ML ~~LOC~~
6.0000 mg | PREFILLED_SYRINGE | Freq: Once | SUBCUTANEOUS | Status: AC
  Administered 2022-07-12: 6 mg via SUBCUTANEOUS
  Filled 2022-07-12: qty 0.6

## 2022-07-12 MED ORDER — FILGRASTIM-SNDZ 480 MCG/0.8ML IJ SOSY
480.0000 ug | PREFILLED_SYRINGE | Freq: Once | INTRAMUSCULAR | Status: DC
  Filled 2022-07-12: qty 0.8

## 2022-07-12 NOTE — Patient Instructions (Signed)
[facility] CANCER CTR AT [place]  Discharge Instructions: Thank you for choosing [facility] to provide your oncology and hematology care.  If you have a lab appointment with the [place], please go directly to the [facility] and check in at the registration area.  Wear comfortable clothing and clothing appropriate for easy access to any Portacath or PICC line.   We strive to give you quality time with your provider. You may need to reschedule your appointment if you arrive late (15 or more minutes).  Arriving late affects you and other patients whose appointments are after yours.  Also, if you miss three or more appointments without notifying the office, you may be dismissed from the clinic at the provider's discretion.      For prescription refill requests, have your pharmacy contact our office and allow 72 hours for refills to be completed.    Today you received the following chemotherapy and/or immunotherapy agents pump stop and Neulasta      To help prevent nausea and vomiting after your treatment, we encourage you to take your nausea medication as directed.  BELOW ARE SYMPTOMS THAT SHOULD BE REPORTED IMMEDIATELY: *FEVER GREATER THAN 100.4 F (38 C) OR HIGHER *CHILLS OR SWEATING *NAUSEA AND VOMITING THAT IS NOT CONTROLLED WITH YOUR NAUSEA MEDICATION *UNUSUAL SHORTNESS OF BREATH *UNUSUAL BRUISING OR BLEEDING *URINARY PROBLEMS (pain or burning when urinating, or frequent urination) *BOWEL PROBLEMS (unusual diarrhea, constipation, pain near the anus) TENDERNESS IN MOUTH AND THROAT WITH OR WITHOUT PRESENCE OF ULCERS (sore throat, sores in mouth, or a toothache) UNUSUAL RASH, SWELLING OR PAIN  UNUSUAL VAGINAL DISCHARGE OR ITCHING   Items with * indicate a potential emergency and should be followed up as soon as possible or go to the Emergency Department if any problems should occur.  Please show the CHEMOTHERAPY ALERT CARD or IMMUNOTHERAPY ALERT CARD at  check-in to the Emergency Department and triage nurse.  Should you have questions after your visit or need to cancel or reschedule your appointment, please contact [facility] CANCER [place] AT [place]  [phone] and follow the prompts.  Office hours are 8:00 a.m. to 4:30 p.m. Monday - Friday. Please note that voicemails left after 4:00 p.m. may not be returned until the following business day.  We are closed weekends and major holidays. You have access to a nurse at all times for urgent questions. Please call the main number to the clinic [phone] and follow the prompts.  For any non-urgent questions, you may also contact your provider using MyChart. We now offer e-Visits for anyone 2 and older to request care online for non-urgent symptoms. For details visit [email or web address].   Also download the MyChart app! Go to the app store, search "MyChart", open the app, select [facility], and log in with your MyChart username and password.  Masks are optional in the cancer centers. If you would like for your care team to wear a mask while they are taking care of you, please let them know. For doctor visits, patients may have with them one support person who is at least 75 years old. At this time, visitors are not allowed in the infusion area.

## 2022-07-14 ENCOUNTER — Ambulatory Visit
Admission: RE | Admit: 2022-07-14 | Discharge: 2022-07-14 | Disposition: A | Discharge status: Home / Self Care | Payer: Medicare Other | Source: Ambulatory Visit | Attending: Oncology | Admitting: Oncology

## 2022-07-14 DIAGNOSIS — C799 Secondary malignant neoplasm of unspecified site: Secondary | ICD-10-CM | POA: Diagnosis not present

## 2022-07-14 DIAGNOSIS — C20 Malignant neoplasm of rectum: Secondary | ICD-10-CM | POA: Diagnosis not present

## 2022-07-14 MED ORDER — TECHNETIUM TC 99M MEDRONATE IV KIT
21.8900 | PACK | Freq: Once | INTRAVENOUS | Status: AC | PRN
  Administered 2022-07-14: 21.89 via INTRAVENOUS

## 2022-07-17 ENCOUNTER — Ambulatory Visit
Admission: RE | Admit: 2022-07-17 | Discharge: 2022-07-17 | Disposition: A | Discharge status: Home / Self Care | Payer: Medicare Other | Source: Ambulatory Visit | Attending: Oncology | Admitting: Oncology

## 2022-07-17 ENCOUNTER — Other Ambulatory Visit: Payer: Medicare Other

## 2022-07-17 DIAGNOSIS — N281 Cyst of kidney, acquired: Secondary | ICD-10-CM | POA: Diagnosis not present

## 2022-07-17 DIAGNOSIS — C20 Malignant neoplasm of rectum: Secondary | ICD-10-CM | POA: Diagnosis not present

## 2022-07-17 DIAGNOSIS — R918 Other nonspecific abnormal finding of lung field: Secondary | ICD-10-CM | POA: Diagnosis not present

## 2022-07-17 MED ORDER — IOHEXOL 300 MG/ML  SOLN
75.0000 mL | Freq: Once | INTRAMUSCULAR | Status: AC | PRN
  Administered 2022-07-17: 75 mL via INTRAVENOUS

## 2022-07-20 ENCOUNTER — Ambulatory Visit: Payer: Medicare Other | Admitting: Podiatry

## 2022-07-20 DIAGNOSIS — D689 Coagulation defect, unspecified: Secondary | ICD-10-CM | POA: Diagnosis not present

## 2022-07-20 DIAGNOSIS — M79675 Pain in left toe(s): Secondary | ICD-10-CM | POA: Diagnosis not present

## 2022-07-20 DIAGNOSIS — M79674 Pain in right toe(s): Secondary | ICD-10-CM | POA: Diagnosis not present

## 2022-07-20 DIAGNOSIS — B351 Tinea unguium: Secondary | ICD-10-CM

## 2022-07-20 NOTE — Progress Notes (Signed)
  Subjective:  Patient ID: Jacqueline Yoder, female    DOB: 07/17/1946,  MRN: [id number]  Chief Complaint  Patient presents with   Nail Problem    Nail trim    76 y.o. female returns for the above complaint.  Patient presents with thickened elongated dystrophic toenails x10.  Patient states painful to touch.  She is not able to debride herself.  She would like for me to do it.  She denies any other acute complaints.  She states that she is on Eliquis from previous history of PE.  Objective:  There were no vitals filed for this visit. Podiatric Exam: Vascular: dorsalis pedis and posterior tibial pulses are palpable bilateral. Capillary return is immediate. Temperature gradient is WNL. Skin turgor WNL  Sensorium: Normal Semmes Weinstein monofilament test. Normal tactile sensation bilaterally. Nail Exam: Pt has thick disfigured discolored nails with subungual debris noted bilateral entire nail hallux through fifth toenails.  Pain on palpation to the nails. Ulcer Exam: There is no evidence of ulcer or pre-ulcerative changes or infection. Orthopedic Exam: Muscle tone and strength are WNL. No limitations in general ROM. No crepitus or effusions noted. HAV  B/L.  Hammer toes 2-5  B/L. Skin: No Porokeratosis. No infection or ulcers    Assessment & Plan:   1. Pain due to onychomycosis of toenails of both feet   2. Coagulation defect [facility])       Patient was evaluated and treated and all questions answered.  Onychomycosis with pain  -Nails palliatively debrided as below. -Educated on self-care  Procedure: Nail Debridement Rationale: pain  Type of Debridement: manual, sharp debridement. Instrumentation: Nail nipper, rotary burr. Number of Nails: 10  Procedures and Treatment: Consent by patient was obtained for treatment procedures. The patient understood the discussion of treatment and procedures well. All questions were answered thoroughly reviewed. Debridement of mycotic and hypertrophic  toenails, 1 through 5 bilateral and clearing of subungual debris. No ulceration, no infection noted.  Return Visit-Office Procedure: Patient instructed to return to the office for a follow up visit 3 months for continued evaluation and treatment.  Boneta Lucks, DPM    No follow-ups on file.

## 2022-07-21 MED FILL — Dexamethasone Sodium Phosphate Inj 100 MG/10ML: INTRAMUSCULAR | Qty: 1 | Status: AC

## 2022-07-24 ENCOUNTER — Inpatient Hospital Stay: Payer: Medicare Other

## 2022-07-24 ENCOUNTER — Inpatient Hospital Stay: Payer: Medicare Other | Attending: Oncology | Admitting: Oncology

## 2022-07-24 ENCOUNTER — Encounter: Payer: Self-pay | Admitting: Oncology

## 2022-07-24 VITALS — BP 133/68 | HR 75 | Temp 97.8°F | Resp 20 | Wt 155.4 lb

## 2022-07-24 DIAGNOSIS — R634 Abnormal weight loss: Secondary | ICD-10-CM | POA: Insufficient documentation

## 2022-07-24 DIAGNOSIS — K435 Parastomal hernia without obstruction or  gangrene: Secondary | ICD-10-CM | POA: Insufficient documentation

## 2022-07-24 DIAGNOSIS — Z933 Colostomy status: Secondary | ICD-10-CM | POA: Diagnosis not present

## 2022-07-24 DIAGNOSIS — T451X5A Adverse effect of antineoplastic and immunosuppressive drugs, initial encounter: Secondary | ICD-10-CM | POA: Insufficient documentation

## 2022-07-24 DIAGNOSIS — C799 Secondary malignant neoplasm of unspecified site: Secondary | ICD-10-CM | POA: Diagnosis not present

## 2022-07-24 DIAGNOSIS — R197 Diarrhea, unspecified: Secondary | ICD-10-CM | POA: Insufficient documentation

## 2022-07-24 DIAGNOSIS — E7800 Pure hypercholesterolemia, unspecified: Secondary | ICD-10-CM | POA: Insufficient documentation

## 2022-07-24 DIAGNOSIS — C20 Malignant neoplasm of rectum: Secondary | ICD-10-CM

## 2022-07-24 DIAGNOSIS — D701 Agranulocytosis secondary to cancer chemotherapy: Secondary | ICD-10-CM | POA: Insufficient documentation

## 2022-07-24 DIAGNOSIS — Z86718 Personal history of other venous thrombosis and embolism: Secondary | ICD-10-CM | POA: Diagnosis not present

## 2022-07-24 DIAGNOSIS — D6481 Anemia due to antineoplastic chemotherapy: Secondary | ICD-10-CM | POA: Diagnosis not present

## 2022-07-24 DIAGNOSIS — I10 Essential (primary) hypertension: Secondary | ICD-10-CM | POA: Diagnosis not present

## 2022-07-24 DIAGNOSIS — Z86711 Personal history of pulmonary embolism: Secondary | ICD-10-CM | POA: Insufficient documentation

## 2022-07-24 DIAGNOSIS — Z79899 Other long term (current) drug therapy: Secondary | ICD-10-CM | POA: Insufficient documentation

## 2022-07-24 DIAGNOSIS — Z7901 Long term (current) use of anticoagulants: Secondary | ICD-10-CM | POA: Insufficient documentation

## 2022-07-24 DIAGNOSIS — K219 Gastro-esophageal reflux disease without esophagitis: Secondary | ICD-10-CM | POA: Insufficient documentation

## 2022-07-24 DIAGNOSIS — Z5111 Encounter for antineoplastic chemotherapy: Secondary | ICD-10-CM | POA: Diagnosis not present

## 2022-07-24 DIAGNOSIS — I700 Atherosclerosis of aorta: Secondary | ICD-10-CM | POA: Diagnosis not present

## 2022-07-24 LAB — CBC WITH DIFFERENTIAL/PLATELET
Abs Immature Granulocytes: 0.02 10*3/uL (ref 0.00–0.07)
Basophils Absolute: 0 10*3/uL (ref 0.0–0.1)
Basophils Relative: 0 %
Eosinophils Absolute: 0.1 10*3/uL (ref 0.0–0.5)
Eosinophils Relative: 2 %
HCT: 36.3 % (ref 36.0–46.0)
Hemoglobin: 11.7 g/dL — ABNORMAL LOW (ref 12.0–15.0)
Immature Granulocytes: 0 %
Lymphocytes Relative: 29 %
Lymphs Abs: 1.3 10*3/uL (ref 0.7–4.0)
MCH: 30.2 pg (ref 26.0–34.0)
MCHC: 32.2 g/dL (ref 30.0–36.0)
MCV: 93.6 fL (ref 80.0–100.0)
Monocytes Absolute: 0.6 10*3/uL (ref 0.1–1.0)
Monocytes Relative: 13 %
Neutro Abs: 2.5 10*3/uL (ref 1.7–7.7)
Neutrophils Relative %: 56 %
Platelets: 176 10*3/uL (ref 150–400)
RBC: 3.88 MIL/uL (ref 3.87–5.11)
RDW: 17.4 % — ABNORMAL HIGH (ref 11.5–15.5)
WBC: 4.6 10*3/uL (ref 4.0–10.5)
nRBC: 0 % (ref 0.0–0.2)

## 2022-07-24 LAB — COMPREHENSIVE METABOLIC PANEL
ALT: 22 U/L (ref 0–44)
AST: 36 U/L (ref 15–41)
Albumin: 3.9 g/dL (ref 3.5–5.0)
Alkaline Phosphatase: 59 U/L (ref 38–126)
Anion gap: 5 (ref 5–15)
BUN: 15 mg/dL (ref 8–23)
CO2: 27 mmol/L (ref 22–32)
Calcium: 9 mg/dL (ref 8.9–10.3)
Chloride: 105 mmol/L (ref 98–111)
Creatinine, Ser: 0.94 mg/dL (ref 0.44–1.00)
GFR, Estimated: >60 mL/min (ref >60)
Glucose, Bld: 94 mg/dL (ref 70–99)
Potassium: 4.3 mmol/L (ref 3.5–5.1)
Sodium: 137 mmol/L (ref 135–145)
Total Bilirubin: 0.5 mg/dL (ref 0.3–1.2)
Total Protein: 6.8 g/dL (ref 6.5–8.1)

## 2022-07-24 LAB — PROTEIN, URINE, RANDOM: Total Protein, Urine: 7 mg/dL

## 2022-07-24 MED ORDER — SODIUM CHLORIDE 0.9 % IV SOLN
10.0000 mg | Freq: Once | INTRAVENOUS | Status: AC
  Administered 2022-07-24: 10 mg via INTRAVENOUS
  Filled 2022-07-24: qty 10

## 2022-07-24 MED ORDER — SODIUM CHLORIDE 0.9 % IV SOLN
5.0000 mg/kg | Freq: Once | INTRAVENOUS | Status: AC
  Administered 2022-07-24: 350 mg via INTRAVENOUS
  Filled 2022-07-24: qty 14

## 2022-07-24 MED ORDER — DIPHENHYDRAMINE HCL 50 MG/ML IJ SOLN
25.0000 mg | Freq: Every day | INTRAMUSCULAR | Status: DC | PRN
  Administered 2022-07-24: 25 mg via INTRAVENOUS
  Filled 2022-07-24: qty 1

## 2022-07-24 MED ORDER — SODIUM CHLORIDE 0.9 % IV SOLN
2400.0000 mg/m2 | INTRAVENOUS | Status: DC
  Administered 2022-07-24: 4300 mg via INTRAVENOUS
  Filled 2022-07-24: qty 86

## 2022-07-24 MED ORDER — SODIUM CHLORIDE 0.9 % IV SOLN
750.0000 mg | Freq: Once | INTRAVENOUS | Status: AC
  Administered 2022-07-24: 750 mg via INTRAVENOUS
  Filled 2022-07-24: qty 37.5

## 2022-07-24 MED ORDER — SODIUM CHLORIDE 0.9 % IV SOLN
Freq: Once | INTRAVENOUS | Status: AC
  Filled 2022-07-24: qty 250

## 2022-07-24 MED ORDER — PALONOSETRON HCL INJECTION 0.25 MG/5ML
0.2500 mg | Freq: Once | INTRAVENOUS | Status: AC
  Administered 2022-07-24: 0.25 mg via INTRAVENOUS
  Filled 2022-07-24: qty 5

## 2022-07-24 MED ORDER — SODIUM CHLORIDE 0.9% FLUSH
10.0000 mL | Freq: Once | INTRAVENOUS | Status: AC
  Administered 2022-07-24: 10 mL via INTRAVENOUS
  Filled 2022-07-24: qty 10

## 2022-07-24 MED ORDER — ATROPINE SULFATE 1 MG/ML IV SOLN
0.5000 mg | Freq: Once | INTRAVENOUS | Status: AC | PRN
  Administered 2022-07-24: 0.5 mg via INTRAVENOUS
  Filled 2022-07-24: qty 1

## 2022-07-24 MED ORDER — HEPARIN SOD (PORK) LOCK FLUSH 100 UNIT/ML IV SOLN
500.0000 [IU] | Freq: Once | INTRAVENOUS | Status: DC
  Filled 2022-07-24: qty 5

## 2022-07-24 MED ORDER — SODIUM CHLORIDE 0.9 % IV SOLN
100.0000 mg/m2 | Freq: Once | INTRAVENOUS | Status: AC
  Administered 2022-07-24: 180 mg via INTRAVENOUS
  Filled 2022-07-24: qty 4

## 2022-07-24 NOTE — Assessment & Plan Note (Addendum)
Continue  Eliquis 2.5 mg twice daily for anticoagulation prophylaxis. 

## 2022-07-24 NOTE — Assessment & Plan Note (Signed)
Chemotherapy plan as listed above 

## 2022-07-24 NOTE — Progress Notes (Signed)
Hematology/Oncology Progress note [phone] [phone]      Patient Care Team: Olin Hauser, DO as PCP - General (Family Medicine) Clent Jacks, RN as Registered Nurse Earlie Server, MD as Consulting Physician (Hematology and Oncology) Cammie Sickle, MD as Consulting Physician (Oncology)  ASSESSMENT & PLAN:   Cancer Staging  Rectal cancer [facility]) Staging form: Colon and Rectum, AJCC 8th Edition - Pathologic stage from 10/06/2019: Stage IIC (ypT4b, pN0, cM0) - Signed by Earlie Server, MD on 10/06/2019 - Clinical stage from 06/30/2020: Jacqueline Yoder - Signed by Earlie Server, MD on 04/17/2022 - Pathologic: Stage Unknown (rpTX, pNX, cM1) - Signed by Earlie Server, MD on 01/31/2021   Rectal cancer [facility]) History of stage IIIC Rectal cancer, s/p TNT, followed by 09/17/19 APR/posterior vaginectomy/TAH/BSO/VY-flap, pT4b pN0 with close vaginal margin 0.2 mm.  Uterus and ovaries negative for malignancy. palliative radiation to vaginal recurrence- 01/19/21 recurrence with lung metastasis.-Palliative -FOLFIRI plus bevacizumab.  Irinotecan was dropped in November 2022 due to side effects. Negative for UGT1A1*28 - radiographically stable, rise of CEA-July 2023 CT showed slightly progression of lung metastasis. Labs are reviewed and discussed with patient. CEA progressively increases Proceed with dose reduced FOLFIRI-[Irinotecan 100 mg/m2] / bevacizumab- CT- bone scan- stable disease RAS wild type. Consider switch to irinotecan +panitumumab upon significant radiographic progression .  Encounter for antineoplastic chemotherapy Chemotherapy plan as listed above.   Anemia due to antineoplastic chemotherapy Hb is stable. observation  Chemotherapy induced neutropenia (HCC)  patient will receive long-acting GCSF   Ziextenzo prophylaxis on D3  History of pulmonary embolism Continue  Eliquis 2.5 mg twice daily for anticoagulation prophylaxis.   Follow up in 2 weeks. Lab MD  next cycle of chemo. All questions were answered. The patient knows to call the clinic with any problems, questions or concerns.  Earlie Server, MD, PhD [facility] Hematology Oncology 07/24/2022      CHIEF COMPLAINTS/REASON FOR VISIT:  Follow up for rectal cancer  HISTORY OF PRESENTING ILLNESS:  Patient initially presented with complaints of postmenopausal bleeding on 08/16/2018.  History of was menopausal vaginal bleeding in 2016 which resulted in cervical polypectomy.  Pathology 02/04/2015 showed cervical polyp, consistent with benign endometrial polyp.  [name] after polypectomy due to anxiety associated with pelvic exams.  pelvic exam on 08/16/2018 reviewed cervical abnormality and from enlarged uterus. Seen by Dr. Marcelline Mates on 10/29/2018.  Endometrial biopsy and a Pap smear was performed. 10/29/2018 Pap smear showed adenocarcinoma, favor endometrial origin. 10/29/2018 endometrial biopsy showed endometrioid carcinoma, FIGO grade 1.  10/29/2018- TA & TV Ultrasound revealed: Anteverted uterus measuring 8.7 x 5.6 x 6.4 cm without evidence of focal masses.  The endometrium measuring 24.1 mm (thickened) and heterogeneous.  Right and left ovaries not visualized.  No adnexal masses identified.  No free fluid in cul-de-sac.  Patient was seen by Dr. Theora Gianotti in clinic on 11/13/2018.  Cervical exam reveals 2 cm exophytic irregular mass consistent with malignancy.   11/19/2018 CT chest abdomen pelvis with contrast showed thickened endometrium with some irregularity compatible with the provided diagnosis of endometrial malignancy.  There is a mildly prominent left inguinal node 1.4 cm.  Patient was seen by Dr. Fransisca Connors on 11/20/2018 and left groin lymph node biopsy was recommended.  11/26/2018 patient underwent left inguinal lymph node biopsy. Pathology showed metastatic adenocarcinoma consistent with colorectal origin.  CDX 2+.  Case was discussed on tumor board.  Recommend colonoscopy for further  evaluation.  Patient reports significant weight loss 30 pounds  over the last year.  Chronic vaginal spotting. Change of bowel habits the past few months.  More constipated.  Family history positive for brother who has colon cancer prostate cancer.  patient has underwent colonoscopy on 12/03/2018 which reviewed a nonobstructing large mass in the rectum.  Also chronic fistula.  Mass was not circumferential.  This was biopsied with a cold forceps for histology.  Pathology came back hyperplastic polyp negative for dysplasia and malignancy. [name], patient underwent flex sigmoidoscopy on 12/06/2018 with rebiopsy of the rectal mass. This time biopsy results came back positive for invasive colorectal adenocarcinoma, moderately differentiated. Immunotherapy for nearly mismatch repair protein (MMR ) was performed.  There is no loss of MMR expression.  low probability of MSI high.   # Seen by [name] for evaluation of resectability for rectal cancer. In addition, she also had a second opinion with [facility] pathology where her endometrial biopsy pathology was changed to  adenocarcinoma, consistent with colorectal primary.   [name]. She has home health that has been assisting with ostomy care  Patient was also evaluated by [facility] oncology.  Recommendation is to proceed with TNT with concurrent chemoradiation followed by neoadjuvant chemotherapy followed by surgical resection. Patient prefers to have treatment done locally with [facility].   # Oncology Treatment:  02/03/2019- 03/19/2019  concurrent Xeloda and radiation.  Xeloda dose 823m /m2 BID - rounded to 16546mBID- on days of radiation. 04/09/2019, started on FOLFOX with bolus early.  Omitted.  07/16/2019 finished 8 cycles of FOLFOX. 09/17/19 APR/posterior vaginectomy/TAH/BSO/VY-flap pT4b pN0 with close vaginal margin 0.2 mm.  Uterus and ovaries negative for malignancy. Patient reports bilateral  lower extremity numbness and tingling, intermittent, left worse than right. She has lost a lot of weight since her APR surgery.   #Family history with half brother having's history of colon cancer prostate cancer.  Personal history of colorectal cancer.  Patient has not decided if she wants genetic testing.    # history of PE( 01/13/2020)  in the bilateral lower extremity DVT (01/13/2020).   She finishes 6 months of anticoagulation with Eliquis 5 mg twice daily. Now switched to Eliquis 2.5 mg twice daily..  # She has now developed recurrent disease. #06/30/20  vaginal introitus mass biopsied. Pathology is consistent with metastatic colorectal adenocarcinoma I have discussed with [name]  Dr. MaHester Matesnd the mass is not resectable. Patient has also had colonoscopy by Dr. AnVicente Malesesterday. Normal examination. # 07/16/2020 cycle 1 FOLFIRI  # 07/20/2020 PET scan was done for further evaluation, images are consistent with local recurrence, no distant metastasis. #Discussed with radiation oncology Dr. ChBaruch Goutyill recommends concurrent chemotherapy and radiation. 08/02/2020-08/16/2020, patient starts radiation.  Xeloda was held due to neutropenia 08/17/2020,-09/06/2020 Xeloda 1500 mg twice daily concurrently with radiation  01/31/21 started on FOLFIRI + Bev 05/18/2021 CT chest abdomen pelvis showed Previously noted enlargement of bilateral inguinal lymph nodes is resolved, consistent with treatment response of nodal metastatic disease. Interval decrease in size of multiple small bilateral pulmonary nodules, consistent with treatment response of pulmonary metastatic disease. No evidence of new metastatic disease. 05/24/2021 - 08/30/2021, continued on FOLFIRI plus bevacizumab.  Irinotecan dose was reduced, eventually 10078m2  09/02/2021, CT chest abdomen pelvis without contrast Showed small bilateral pulmonary nodules, unchanged.  Stable metastatic disease.  No noncontrast evidence of new metastatic disease  in the chest abdomen pelvis.  Small parastomal hernia.  Enlargement of main pulmonary artery.  Coronary artery disease.  09/13/2021, maintenance 5-FU/bevacizumab 11/28/2021, 5-FU/Irinotecan/bevacizumab.  Irinotecan 100 mg/m2 was added back due to progressively increasing CEA.  INTERVAL HISTORY DEYANNA MCTIER is a 76 y.o. female who has above history reviewed by me presents for follow-up of rectal cancer. Currently on dose reduced FOLFIRI/bevacizumab   She had diarrhea after CT scan, took imodium PRN with symptom improvement.  No new complaints.   Review of Systems  Constitutional:  Positive for fatigue. Negative for appetite change, chills, fever and unexpected weight change.  HENT:   Negative for hearing loss and voice change.   Eyes:  Negative for eye problems.  Respiratory:  Negative for chest tightness and cough.   Cardiovascular:  Negative for chest pain.  Gastrointestinal:  Negative for abdominal distention, abdominal pain, blood in stool, constipation, diarrhea and nausea.  Endocrine: Negative for hot flashes.  Genitourinary:  Negative for difficulty urinating and frequency.   Musculoskeletal:  Negative for arthralgias.  Skin:  Negative for itching and rash.  Neurological:  Negative for extremity weakness and numbness.  Hematological:  Negative for adenopathy.  Psychiatric/Behavioral:  Negative for confusion.     MEDICAL HISTORY:  Past Medical History:  Diagnosis Date   Allergy    Arthritis    Blood clot in vein    Family history of colon cancer    GERD (gastroesophageal reflux disease)    Hypercholesteremia    Hypertension    Hypertension    Lower extremity edema    Personal history of chemotherapy    Rectal cancer ([place]) 12/2018   Urinary incontinence     SURGICAL HISTORY: Past Surgical History:  Procedure Laterality Date   ABDOMINAL HYSTERECTOMY     CHOLECYSTECTOMY  1971   COLONOSCOPY WITH PROPOFOL N/A 12/03/2018   Procedure: COLONOSCOPY WITH PROPOFOL;   Surgeon: Lucilla Lame, MD;  Location: [facility] ENDOSCOPY;  Service: Endoscopy;  Laterality: N/A;   COLONOSCOPY WITH PROPOFOL N/A 07/15/2020   Procedure: COLONOSCOPY WITH PROPOFOL;  Surgeon: [name], MD;  Location: [facility] ENDOSCOPY;  Service: Gastroenterology;  Laterality: N/A;   FLEXIBLE SIGMOIDOSCOPY N/A 12/06/2018   Procedure: FLEXIBLE SIGMOIDOSCOPY;  Surgeon: [name], MD;  Location: [facility] ENDOSCOPY;  Service: Endoscopy;  Laterality: N/A;   LAPAROSCOPIC COLOSTOMY  01/06/2019   PORTACATH PLACEMENT N/A 04/03/2019   Procedure: INSERTION PORT-A-CATH;  Surgeon: Jules Husbands, MD;  Location: [facility] ORS;  Service: General;  Laterality: N/A;    SOCIAL HISTORY: Social History   Socioeconomic History   Marital status: Widowed    Spouse name: Not on file   Number of children: Not on file   Years of education: Not on file   Highest education level: Not on file  Occupational History   Not on file  Tobacco Use   Smoking status: Former    Types: Cigarettes    Quit date: 12/02/1977    Years since quitting: 44.6   Smokeless tobacco: Former  Scientific laboratory technician Use: Never used  Substance and Sexual Activity   Alcohol use: Never   Drug use: Never   Sexual activity: Not Currently    Birth control/protection: None  Other Topics Concern   Not on file  Social History Narrative   Lives with daughter   Social Determinants of Health   Financial Resource Strain: Medium Risk (06/30/2022)   Overall Financial Resource Strain (CARDIA)    Difficulty of Paying Living Expenses: Somewhat hard  Food Insecurity: No Food Insecurity (06/30/2022)   Hunger Vital Sign    Worried About Charity fundraiser in  the Last Year: Never true    [place] in the Last Year: Never true  Transportation Needs: No Transportation Needs (06/30/2022)   PRAPARE - Hydrologist (Medical): No    Lack of Transportation (Non-Medical): No  Physical Activity: Inactive (06/30/2022)   Exercise Vital Sign     Days of Exercise per Week: 0 days    Minutes of Exercise per Session: 0 min  Stress: No Stress Concern Present (06/30/2022)   [place]    Feeling of Stress : Not at all  Social Connections: Moderately Isolated (06/30/2022)   Social Connection and Isolation Panel [NHANES]    Frequency of Communication with Friends and Family: More than three times a week    Frequency of Social Gatherings with Friends and Family: Twice a week    Attends Religious Services: More than 4 times per year    Active Member of [employer] or Organizations: No    Attends Archivist Meetings: Never    Marital Status: Widowed  Intimate Partner Violence: Not At Risk (06/30/2022)   Humiliation, Afraid, Rape, and Kick questionnaire    Fear of Current or Ex-Partner: No    Emotionally Abused: No    Physically Abused: No    Sexually Abused: No    FAMILY HISTORY: Family History  Problem Relation Age of Onset   Colon cancer Brother 70       exposure to chemicals [place]   Hypertension Mother    Stroke Mother    Kidney failure Father    Breast cancer Neg Hx    Ovarian cancer Neg Hx     ALLERGIES:  is allergic to sulfamethoxazole-trimethoprim.  MEDICATIONS:  Current Outpatient Medications  Medication Sig Dispense Refill   Cholecalciferol (VITAMIN D3) 2000 units capsule Take 2,000 Units by mouth daily.     cholestyramine (QUESTRAN) 4 g packet Take 1 packet (4 g total) by mouth 3 (three) times daily. 90 each 1   diclofenac sodium (VOLTAREN) 1 % GEL Apply 2 g topically 4 (four) times daily as needed (joint pain).  11   diphenoxylate-atropine (LOMOTIL) 2.5-0.025 MG tablet Take 1 tablet by mouth 4 (four) times daily as needed for diarrhea or loose stools. 30 tablet 1   ELIQUIS 2.5 MG TABS tablet TAKE 1 TABLET BY MOUTH TWICE  DAILY 60 tablet 11   fluticasone (FLONASE) 50 MCG/ACT nasal spray USE 1 SPRAY IN EACH NOSTRIL ONCE DAILY 16 g 3    gabapentin (NEURONTIN) 100 MG capsule Take 1 capsule (100 mg total) by mouth at bedtime. 30 capsule 0   ipratropium (ATROVENT) 0.03 % nasal spray USE 1 SPRAY IN BOTH NOSTRILS  TWICE DAILY 30 mL 11   lidocaine-prilocaine (EMLA) cream Apply 1 application. topically as needed. 30 g 6   loperamide (IMODIUM) 2 MG capsule Take 1 capsule (2 mg total) by mouth See admin instructions. With onset of loose stool, take 44m followed by 245mevery 2 hours,  Maximum: 16 mg/day 120 capsule 1   loratadine (CLARITIN) 10 MG tablet Take 10 mg by mouth daily.     Multiple Vitamins-Minerals (ONE-A-DAY WOMENS 50 PLUS PO) Take 1 tablet by mouth daily.      potassium chloride SA (KLOR-CON M) 20 MEQ tablet Take 2 tablets (40 mEq total) by mouth daily. 60 tablet 5   potassium chloride SA (KLOR-CON M) 20 MEQ tablet Take 1 tablet (20 mEq total) by mouth 2 (two) times  daily. 20 tablet 0   simvastatin (ZOCOR) 40 MG tablet Take 1 tablet (40 mg total) by mouth at bedtime. 100 tablet 3   triamterene-hydrochlorothiazide (DYAZIDE) 37.5-25 MG capsule Take 1 each (1 capsule total) by mouth daily. 100 capsule 3   zinc gluconate 50 MG tablet Take 50 mg by mouth daily.     No current facility-administered medications for this visit.   Facility-Administered Medications Ordered in Other Visits  Medication Dose Route Frequency Provider Last Rate Last Admin   diphenhydrAMINE (BENADRYL) injection 25 mg  25 mg Intravenous Daily PRN Earlie Server, MD   25 mg at 06/26/22 1405   diphenhydrAMINE (BENADRYL) injection 25 mg  25 mg Intravenous Daily PRN Earlie Server, MD   25 mg at 07/24/22 0936   fluorouracil (ADRUCIL) 4,300 mg in sodium chloride 0.9 % 64 mL chemo infusion  2,400 mg/m2 (Treatment Plan Recorded) Intravenous 1 day or 1 dose Earlie Server, MD       heparin lock flush 100 unit/mL  500 Units Intravenous Once Earlie Server, MD       irinotecan (CAMPTOSAR) 180 mg in sodium chloride 0.9 % 500 mL chemo infusion  100 mg/m2 (Treatment Plan Recorded) Intravenous  Once Earlie Server, MD       leucovorin 750 mg in sodium chloride 0.9 % 250 mL infusion  750 mg Intravenous Once Earlie Server, MD         PHYSICAL EXAMINATION: ECOG PERFORMANCE STATUS: 1 - Symptomatic but completely ambulatory  Physical Exam Constitutional:      General: She is not in acute distress. HENT:     Head: Normocephalic and atraumatic.  Eyes:     General: No scleral icterus. Cardiovascular:     Rate and Rhythm: Normal rate and regular rhythm.     Heart sounds: Normal heart sounds.  Pulmonary:     Effort: Pulmonary effort is normal. No respiratory distress.     Breath sounds: No wheezing.  Abdominal:     General: Bowel sounds are normal. There is no distension.     Palpations: Abdomen is soft.     Comments: + Colostomy bag   Musculoskeletal:        General: No deformity. Normal range of motion.     Cervical back: Normal range of motion and neck supple.  Skin:    General: Skin is warm and dry.     Findings: No erythema or rash.  Neurological:     Mental Status: She is alert and oriented to person, place, and time. Mental status is at baseline.     Cranial Nerves: No cranial nerve deficit.     Coordination: Coordination normal.       LABORATORY DATA:  I have reviewed the data as listed    Latest Ref Rng & Units 07/24/2022    8:40 AM 07/10/2022    8:49 AM 06/26/2022    8:36 AM  CBC  WBC 4.0 - 10.5 K/uL 4.6  6.7  6.7   Hemoglobin 12.0 - 15.0 g/dL 11.7  12.1  12.1   Hematocrit 36.0 - 46.0 % 36.3  38.2  38.4   Platelets 150 - 400 K/uL 176  205  210       Latest Ref Rng & Units 07/24/2022    8:40 AM 07/10/2022    8:49 AM 06/26/2022    8:36 AM  CMP  Glucose 70 - 99 mg/dL 94  95  97   BUN 8 - 23 mg/dL 15  11  14  Creatinine 0.44 - 1.00 mg/dL 0.94  0.93  0.83   Sodium 135 - 145 mmol/L 137  137  139   Potassium 3.5 - 5.1 mmol/L 4.3  4.1  4.2   Chloride 98 - 111 mmol/L 105  104  106   CO2 22 - 32 mmol/L '27  28  27   ' Calcium 8.9 - 10.3 mg/dL 9.0  9.2  9.3   Total  Protein 6.5 - 8.1 g/dL 6.8  6.8  6.7   Total Bilirubin 0.3 - 1.2 mg/dL 0.5  0.3  0.4   Alkaline Phos 38 - 126 U/L 59  74  73   AST 15 - 41 U/L 36  27  28   ALT 0 - 44 U/L '22  16  15      ' RADIOGRAPHIC STUDIES: I have personally reviewed the radiological images as listed and agreed with the findings in the report. CT CHEST ABDOMEN PELVIS W CONTRAST  Result Date: 07/18/2022 CLINICAL DATA:  Metastatic rectal adenocarcinoma. Ongoing chemotherapy. * Tracking Code: BO * EXAM: CT CHEST, ABDOMEN, AND PELVIS WITH CONTRAST TECHNIQUE: Multidetector CT imaging of the chest, abdomen and pelvis was performed following the standard protocol during bolus administration of intravenous contrast. RADIATION DOSE REDUCTION: This exam was performed according to the departmental dose-optimization program which includes automated exposure control, adjustment of the mA and/or kV according to patient size and/or use of iterative reconstruction technique. CONTRAST:  13m OMNIPAQUE IOHEXOL 300 MG/ML  SOLN COMPARISON:  04/26/2022 FINDINGS: CT CHEST FINDINGS Cardiovascular: Coronary, aortic arch, and branch vessel atherosclerotic vascular disease. Right Port-A-Cath tip: Cavoatrial junction. Mediastinum/Nodes: Unremarkable Lungs/Pleura: Generally stable small scattered bilateral pulmonary nodules. One of the largest is a bilobed 8 by 6 mm right middle lobe nodule on image 106 series 3. No new nodules identified. Musculoskeletal: Degenerative glenohumeral arthropathy noted. Whole-body bone scan rib revealed accentuated activity in the right humeral head, on today's CT I do not see a specific cause for this accentuated activity, although MRI might be more sensitive. Thoracic spondylosis noted. CT ABDOMEN PELVIS FINDINGS Hepatobiliary: Gallbladder absent. Stable mild intrahepatic biliary dilatation, possibly a physiologic response to cholecystectomy. No significant focal liver lesion. Pancreas: Unremarkable Spleen: Unremarkable  Adrenals/Urinary Tract: Stable appearance of bilateral benign-appearing renal cysts which do not warrant individual follow up. Adrenal glands unremarkable. Stomach/Bowel: Descending colostomy. There some small diverticula of the descending colon without findings of active diverticulitis. Pelvic floor laxity with loops of small bowel extending 3 cm below the pubococcygeal line. Vascular/Lymphatic: Atherosclerosis is present, including aortoiliac atherosclerotic disease. Atherosclerotic plaque at the origin of the celiac trunk and proximally in the SMA, causing stenosis in the proximal SMA no compelling findings of occlusion of the superior mesenteric artery. No pathologic adenopathy is identified. Reproductive: Uterus absent.  Adnexa unremarkable. Other: No supplemental non-categorized findings. Musculoskeletal: Pelvic floor laxity. Subtle chronic posterior cortical irregularity and heterogeneity in the left iliac bone on image 82 of series 2, not appreciable on the bone scan of 07/14/2022, this has been present over the past year and could reflect radiation therapy related findings although strictly speaking, malignant involvement of the left iliac bone is difficult to exclude. Lumbar spondylosis and degenerative disc disease with mild resulting left foraminal stenosis at L4-5. IMPRESSION: 1. Stable scattered small bilateral pulmonary nodules, largest 8 by 6 mm in the right middle lobe. 2. Subtle chronically stable posterior cortical irregularity and heterogeneity in the left iliac bone, not appreciable on the bone scan of 07/14/2022. This has been present at  least over the past year and could reflect radiation therapy related findings although strictly speaking, malignant involvement of the left iliac bone is difficult to exclude. If clinically warranted this could be further assessed with nuclear medicine PET-CT or MRI of the bony pelvis with and without contrast. 3. Pelvic floor laxity with loops of small bowel  extending 3 cm below the pubococcygeal line. 4. Aortic and systemic atherosclerosis as detailed above. Aortic Atherosclerosis (ICD10-I70.0). Electronically Signed   By: Van Clines M.D.   On: 07/18/2022 13:32   NM Bone Scan Whole Body  Result Date: 07/14/2022 CLINICAL DATA:  Metastatic carcinoma. Hip pain. Metastatic adenocarcinoma. Rectal carcinoma EXAM: NUCLEAR MEDICINE WHOLE BODY BONE SCAN TECHNIQUE: Whole body anterior and posterior images were obtained approximately 3 hours after intravenous injection of radiopharmaceutical. RADIOPHARMACEUTICALS:  21.9 mCi Technetium-15mMDP IV COMPARISON:  Hip radiograph 06/28/2022, CT chest abdomen pelvis 03/27/2022 FINDINGS: No abnormal radiotracer accumulation within the LEFT or RIGHT hip. No abnormal accumulation within the pelvis or spine. Intense uptake within the RIGHT humeral head mild uptake in the LEFT humeral head. No lesion identified in the RIGHT humeral head on CT 04/26/2022 (partially imaged). Additionally there was mild radiotracer activity associated with the glenohumeral joint on the RIGHT on FDG PET-CT scan 07/20/2020. This combination of findings favors degenerative uptake in the RIGHT humeral head. IMPRESSION: 1. No evidence of skeletal metastatic disease in the hips. 2. Asymmetric increased activity in the RIGHT humeral head is favored degenerative as described above. If pain in the RIGHT shoulder consider further evaluation with plain film or MRI. Electronically Signed   By: SSuzy BouchardM.D.   On: 07/14/2022 12:59   DG HIP UNILAT W OR W/O PELVIS 2-3 VIEWS LEFT  Result Date: 06/30/2022 CLINICAL DATA:  Left hip pain. EXAM: DG HIP (WITH OR WITHOUT PELVIS) 2-3V LEFT COMPARISON:  None Available. FINDINGS: Both hips are normally located. Mild degenerative changes. No acute bony findings and no plain film evidence of AVN. The pubic symphysis and SI joints are intact. No pelvic fractures or bone lesions. IMPRESSION: Mild degenerative changes  but no acute bony findings. Electronically Signed   By: PMarijo SanesM.D.   On: 06/30/2022 09:07   CT CHEST ABDOMEN PELVIS W CONTRAST  Result Date: 04/26/2022 CLINICAL DATA:  Patient with history of rectal carcinoma. On chemotherapy. * Tracking Code: BO * EXAM: CT CHEST, ABDOMEN, AND PELVIS WITH CONTRAST TECHNIQUE: Multidetector CT imaging of the chest, abdomen and pelvis was performed following the standard protocol during bolus administration of intravenous contrast. RADIATION DOSE REDUCTION: This exam was performed according to the departmental dose-optimization program which includes automated exposure control, adjustment of the mA and/or kV according to patient size and/or use of iterative reconstruction technique. CONTRAST:  1062mOMNIPAQUE IOHEXOL 300 MG/ML  SOLN COMPARISON:  CT C AP January 20, 2022 FINDINGS: CT CHEST FINDINGS Cardiovascular: Right anterior chest wall Port-A-Cath is present with tip terminating in the superior vena cava. Normal heart size. Trace fluid superior pericardial recess. Thoracic aortic vascular calcifications. Mediastinum/Nodes: No enlarged axillary, mediastinal or hilar lymphadenopathy. Normal appearance of the esophagus. Lungs/Pleura: Central airways are patent. Dependent atelectasis within the lower lobes bilaterally. Stable 4 mm left upper lobe nodule (image 47; series 3). Stable 3 mm left upper lobe nodule (image 28; series 3). Biapical pleuroparenchymal thickening/scarring. Mild interval increase in size of 3 mm right upper lobe nodule (image 41; series 3), previously 2 mm. Interval increase in size of 8 mm right middle lobe nodule (image 104;  series 3), previously 5 mm. Slight interval increase in size of 7 mm right middle lobe nodule (image 100; series 3), previously 5 mm. No pleural effusion or pneumothorax. Musculoskeletal: No aggressive or acute appearing osseous lesions. CT ABDOMEN PELVIS FINDINGS Hepatobiliary: Liver is normal in size and contour. No focal hepatic  lesion identified. Stable biliary ductal dilatation. Pancreas: Unremarkable Spleen: Unremarkable Adrenals/Urinary Tract: Normal adrenal glands. Kidneys enhance symmetrically with contrast. Bilateral renal cysts redemonstrated. No hydronephrosis. Urinary bladder is unremarkable. Stomach/Bowel: Patient is status post APR with left colostomy. No evidence for bowel obstruction. No free fluid or free intraperitoneal air. Normal morphology the stomach. Redemonstrated pelvic floor laxity and associated bulging of small bowel. Vascular/Lymphatic: Normal caliber abdominal aorta. Peripheral calcified atherosclerotic plaque. No retroperitoneal lymphadenopathy. Reproductive: Prior hysterectomy.  Pelvic masses. Other: None. Musculoskeletal: No aggressive or acute appearing osseous lesions. Lumbar spine degenerative changes. IMPRESSION: 1. Slight interval increase in size of a few of the scattered pulmonary nodules. Other nodules are stable. 2. Similar biliary ductal dilatation. 3. Prior APR with left colostomy, similar to prior. Electronically Signed   By: Lovey Newcomer M.D.   On: 04/26/2022 17:23      CT CHEST ABDOMEN PELVIS W CONTRAST  Result Date: 07/18/2022 CLINICAL DATA:  Metastatic rectal adenocarcinoma. Ongoing chemotherapy. * Tracking Code: BO * EXAM: CT CHEST, ABDOMEN, AND PELVIS WITH CONTRAST TECHNIQUE: Multidetector CT imaging of the chest, abdomen and pelvis was performed following the standard protocol during bolus administration of intravenous contrast. RADIATION DOSE REDUCTION: This exam was performed according to the departmental dose-optimization program which includes automated exposure control, adjustment of the mA and/or kV according to patient size and/or use of iterative reconstruction technique. CONTRAST:  78m OMNIPAQUE IOHEXOL 300 MG/ML  SOLN COMPARISON:  04/26/2022 FINDINGS: CT CHEST FINDINGS Cardiovascular: Coronary, aortic arch, and branch vessel atherosclerotic vascular disease. Right Port-A-Cath  tip: Cavoatrial junction. Mediastinum/Nodes: Unremarkable Lungs/Pleura: Generally stable small scattered bilateral pulmonary nodules. One of the largest is a bilobed 8 by 6 mm right middle lobe nodule on image 106 series 3. No new nodules identified. Musculoskeletal: Degenerative glenohumeral arthropathy noted. Whole-body bone scan rib revealed accentuated activity in the right humeral head, on today's CT I do not see a specific cause for this accentuated activity, although MRI might be more sensitive. Thoracic spondylosis noted. CT ABDOMEN PELVIS FINDINGS Hepatobiliary: Gallbladder absent. Stable mild intrahepatic biliary dilatation, possibly a physiologic response to cholecystectomy. No significant focal liver lesion. Pancreas: Unremarkable Spleen: Unremarkable Adrenals/Urinary Tract: Stable appearance of bilateral benign-appearing renal cysts which do not warrant individual follow up. Adrenal glands unremarkable. Stomach/Bowel: Descending colostomy. There some small diverticula of the descending colon without findings of active diverticulitis. Pelvic floor laxity with loops of small bowel extending 3 cm below the pubococcygeal line. Vascular/Lymphatic: Atherosclerosis is present, including aortoiliac atherosclerotic disease. Atherosclerotic plaque at the origin of the celiac trunk and proximally in the SMA, causing stenosis in the proximal SMA no compelling findings of occlusion of the superior mesenteric artery. No pathologic adenopathy is identified. Reproductive: Uterus absent.  Adnexa unremarkable. Other: No supplemental non-categorized findings. Musculoskeletal: Pelvic floor laxity. Subtle chronic posterior cortical irregularity and heterogeneity in the left iliac bone on image 82 of series 2, not appreciable on the bone scan of 07/14/2022, this has been present over the past year and could reflect radiation therapy related findings although strictly speaking, malignant involvement of the left iliac bone is  difficult to exclude. Lumbar spondylosis and degenerative disc disease with mild resulting left foraminal stenosis at L4-5.  IMPRESSION: 1. Stable scattered small bilateral pulmonary nodules, largest 8 by 6 mm in the right middle lobe. 2. Subtle chronically stable posterior cortical irregularity and heterogeneity in the left iliac bone, not appreciable on the bone scan of 07/14/2022. This has been present at least over the past year and could reflect radiation therapy related findings although strictly speaking, malignant involvement of the left iliac bone is difficult to exclude. If clinically warranted this could be further assessed with nuclear medicine PET-CT or MRI of the bony pelvis with and without contrast. 3. Pelvic floor laxity with loops of small bowel extending 3 cm below the pubococcygeal line. 4. Aortic and systemic atherosclerosis as detailed above. Aortic Atherosclerosis (ICD10-I70.0). Electronically Signed   By: Van Clines M.D.   On: 07/18/2022 13:32   NM Bone Scan Whole Body  Result Date: 07/14/2022 CLINICAL DATA:  Metastatic carcinoma. Hip pain. Metastatic adenocarcinoma. Rectal carcinoma EXAM: NUCLEAR MEDICINE WHOLE BODY BONE SCAN TECHNIQUE: Whole body anterior and posterior images were obtained approximately 3 hours after intravenous injection of radiopharmaceutical. RADIOPHARMACEUTICALS:  21.9 mCi Technetium-35mMDP IV COMPARISON:  Hip radiograph 06/28/2022, CT chest abdomen pelvis 03/27/2022 FINDINGS: No abnormal radiotracer accumulation within the LEFT or RIGHT hip. No abnormal accumulation within the pelvis or spine. Intense uptake within the RIGHT humeral head mild uptake in the LEFT humeral head. No lesion identified in the RIGHT humeral head on CT 04/26/2022 (partially imaged). Additionally there was mild radiotracer activity associated with the glenohumeral joint on the RIGHT on FDG PET-CT scan 07/20/2020. This combination of findings favors degenerative uptake in the RIGHT  humeral head. IMPRESSION: 1. No evidence of skeletal metastatic disease in the hips. 2. Asymmetric increased activity in the RIGHT humeral head is favored degenerative as described above. If pain in the RIGHT shoulder consider further evaluation with plain film or MRI. Electronically Signed   By: SSuzy BouchardM.D.   On: 07/14/2022 12:59   DG HIP UNILAT W OR W/O PELVIS 2-3 VIEWS LEFT  Result Date: 06/30/2022 CLINICAL DATA:  Left hip pain. EXAM: DG HIP (WITH OR WITHOUT PELVIS) 2-3V LEFT COMPARISON:  None Available. FINDINGS: Both hips are normally located. Mild degenerative changes. No acute bony findings and no plain film evidence of AVN. The pubic symphysis and SI joints are intact. No pelvic fractures or bone lesions. IMPRESSION: Mild degenerative changes but no acute bony findings. Electronically Signed   By: PMarijo SanesM.D.   On: 06/30/2022 09:07   CT CHEST ABDOMEN PELVIS W CONTRAST  Result Date: 04/26/2022 CLINICAL DATA:  Patient with history of rectal carcinoma. On chemotherapy. * Tracking Code: BO * EXAM: CT CHEST, ABDOMEN, AND PELVIS WITH CONTRAST TECHNIQUE: Multidetector CT imaging of the chest, abdomen and pelvis was performed following the standard protocol during bolus administration of intravenous contrast. RADIATION DOSE REDUCTION: This exam was performed according to the departmental dose-optimization program which includes automated exposure control, adjustment of the mA and/or kV according to patient size and/or use of iterative reconstruction technique. CONTRAST:  101mOMNIPAQUE IOHEXOL 300 MG/ML  SOLN COMPARISON:  CT C AP January 20, 2022 FINDINGS: CT CHEST FINDINGS Cardiovascular: Right anterior chest wall Port-A-Cath is present with tip terminating in the superior vena cava. Normal heart size. Trace fluid superior pericardial recess. Thoracic aortic vascular calcifications. Mediastinum/Nodes: No enlarged axillary, mediastinal or hilar lymphadenopathy. Normal appearance of the  esophagus. Lungs/Pleura: Central airways are patent. Dependent atelectasis within the lower lobes bilaterally. Stable 4 mm left upper lobe nodule (image 47; series 3). Stable  3 mm left upper lobe nodule (image 28; series 3). Biapical pleuroparenchymal thickening/scarring. Mild interval increase in size of 3 mm right upper lobe nodule (image 41; series 3), previously 2 mm. Interval increase in size of 8 mm right middle lobe nodule (image 104; series 3), previously 5 mm. Slight interval increase in size of 7 mm right middle lobe nodule (image 100; series 3), previously 5 mm. No pleural effusion or pneumothorax. Musculoskeletal: No aggressive or acute appearing osseous lesions. CT ABDOMEN PELVIS FINDINGS Hepatobiliary: Liver is normal in size and contour. No focal hepatic lesion identified. Stable biliary ductal dilatation. Pancreas: Unremarkable Spleen: Unremarkable Adrenals/Urinary Tract: Normal adrenal glands. Kidneys enhance symmetrically with contrast. Bilateral renal cysts redemonstrated. No hydronephrosis. Urinary bladder is unremarkable. Stomach/Bowel: Patient is status post APR with left colostomy. No evidence for bowel obstruction. No free fluid or free intraperitoneal air. Normal morphology the stomach. Redemonstrated pelvic floor laxity and associated bulging of small bowel. Vascular/Lymphatic: Normal caliber abdominal aorta. Peripheral calcified atherosclerotic plaque. No retroperitoneal lymphadenopathy. Reproductive: Prior hysterectomy.  Pelvic masses. Other: None. Musculoskeletal: No aggressive or acute appearing osseous lesions. Lumbar spine degenerative changes. IMPRESSION: 1. Slight interval increase in size of a few of the scattered pulmonary nodules. Other nodules are stable. 2. Similar biliary ductal dilatation. 3. Prior APR with left colostomy, similar to prior. Electronically Signed   By: Lovey Newcomer M.D.   On: 04/26/2022 17:23

## 2022-07-24 NOTE — Progress Notes (Signed)
Patient states she has had some diarrhea since her ct scan on 10/2 but currently patient states diarrhea has eased up some.

## 2022-07-24 NOTE — Assessment & Plan Note (Signed)
patient will receive long-acting GCSF   Ziextenzo prophylaxis on D3 

## 2022-07-24 NOTE — Patient Instructions (Signed)
[facility] AT [place]  Discharge Instructions: Thank you for choosing [facility] to provide your oncology and hematology care.  If you have a lab appointment with the [facility], please go directly to the [facility] and check in at the registration area.  Wear comfortable clothing and clothing appropriate for easy access to any Portacath or PICC line.   We strive to give you quality time with your provider. You may need to reschedule your appointment if you arrive late (15 or more minutes).  Arriving late affects you and other patients whose appointments are after yours.  Also, if you miss three or more appointments without notifying the office, you may be dismissed from the clinic at the provider's discretion.      For prescription refill requests, have your pharmacy contact our office and allow 72 hours for refills to be completed.    Today you received the following chemotherapy and/or immunotherapy agents irinotecan, avastin, leucovorin, 5FU      To help prevent nausea and vomiting after your treatment, we encourage you to take your nausea medication as directed.  BELOW ARE SYMPTOMS THAT SHOULD BE REPORTED IMMEDIATELY: *FEVER GREATER THAN 100.4 F (38 C) OR HIGHER *CHILLS OR SWEATING *NAUSEA AND VOMITING THAT IS NOT CONTROLLED WITH YOUR NAUSEA MEDICATION *UNUSUAL SHORTNESS OF BREATH *UNUSUAL BRUISING OR BLEEDING *URINARY PROBLEMS (pain or burning when urinating, or frequent urination) *BOWEL PROBLEMS (unusual diarrhea, constipation, pain near the anus) TENDERNESS IN MOUTH AND THROAT WITH OR WITHOUT PRESENCE OF ULCERS (sore throat, sores in mouth, or a toothache) UNUSUAL RASH, SWELLING OR PAIN  UNUSUAL VAGINAL DISCHARGE OR ITCHING   Items with * indicate a potential emergency and should be followed up as soon as possible or go to the Emergency Department if any problems should occur.  Please show the CHEMOTHERAPY ALERT CARD or IMMUNOTHERAPY  ALERT CARD at check-in to the Emergency Department and triage nurse.  Should you have questions after your visit or need to cancel or reschedule your appointment, please contact [facility] [place]  [phone] and follow the prompts.  Office hours are 8:00 a.m. to 4:30 p.m. Monday - Friday. Please note that voicemails left after 4:00 p.m. may not be returned until the following business day.  We are closed weekends and major holidays. You have access to a nurse at all times for urgent questions. Please call the main number to the clinic [phone] and follow the prompts.  For any non-urgent questions, you may also contact your provider using MyChart. We now offer e-Visits for anyone 48 and older to request care online for non-urgent symptoms. For details visit [email or web address].   Also download the MyChart app! Go to the app store, search "MyChart", open the app, select [facility], and log in with your MyChart username and password.  Masks are optional in the cancer centers. If you would like for your care team to wear a mask while they are taking care of you, please let them know. For doctor visits, patients may have with them one support person who is at least 75 years old. At this time, visitors are not allowed in the infusion area.

## 2022-07-24 NOTE — Assessment & Plan Note (Addendum)
History of stage IIIC Rectal cancer, s/p TNT, followed by 09/17/19 APR/posterior vaginectomy/TAH/BSO/VY-flap, pT4b pN0 with close vaginal margin 0.2 mm.  Uterus and ovaries negative for malignancy. palliative radiation to vaginal recurrence- 01/19/21 recurrence with lung metastasis.-Palliative -FOLFIRI plus bevacizumab.  Irinotecan was dropped in November 2022 due to side effects. Negative for UGT1A1*28 - radiographically stable, rise of CEA-July 2023 CT showed slightly progression of lung metastasis. Labs are reviewed and discussed with patient. CEA progressively increases Proceed with dose reduced FOLFIRI-[Irinotecan 100 mg/m2] / bevacizumab- CT- bone scan- stable disease  RAS wild type. Consider switch to irinotecan +panitumumab upon significant radiographic progression . 

## 2022-07-24 NOTE — Assessment & Plan Note (Addendum)
Hb is stable. observation 

## 2022-07-25 LAB — CEA: CEA: 2042 ng/mL — ABNORMAL HIGH (ref 0.0–4.7)

## 2022-07-26 ENCOUNTER — Inpatient Hospital Stay: Payer: Medicare Other

## 2022-07-26 VITALS — BP 148/68 | HR 78 | Temp 97.2°F | Resp 18

## 2022-07-26 DIAGNOSIS — E7800 Pure hypercholesterolemia, unspecified: Secondary | ICD-10-CM | POA: Diagnosis not present

## 2022-07-26 DIAGNOSIS — D701 Agranulocytosis secondary to cancer chemotherapy: Secondary | ICD-10-CM | POA: Diagnosis not present

## 2022-07-26 DIAGNOSIS — I10 Essential (primary) hypertension: Secondary | ICD-10-CM | POA: Diagnosis not present

## 2022-07-26 DIAGNOSIS — K435 Parastomal hernia without obstruction or  gangrene: Secondary | ICD-10-CM | POA: Diagnosis not present

## 2022-07-26 DIAGNOSIS — Z86711 Personal history of pulmonary embolism: Secondary | ICD-10-CM | POA: Diagnosis not present

## 2022-07-26 DIAGNOSIS — Z86718 Personal history of other venous thrombosis and embolism: Secondary | ICD-10-CM | POA: Diagnosis not present

## 2022-07-26 DIAGNOSIS — I700 Atherosclerosis of aorta: Secondary | ICD-10-CM | POA: Diagnosis not present

## 2022-07-26 DIAGNOSIS — K219 Gastro-esophageal reflux disease without esophagitis: Secondary | ICD-10-CM | POA: Diagnosis not present

## 2022-07-26 DIAGNOSIS — C799 Secondary malignant neoplasm of unspecified site: Secondary | ICD-10-CM

## 2022-07-26 DIAGNOSIS — C20 Malignant neoplasm of rectum: Secondary | ICD-10-CM | POA: Diagnosis not present

## 2022-07-26 DIAGNOSIS — R197 Diarrhea, unspecified: Secondary | ICD-10-CM | POA: Diagnosis not present

## 2022-07-26 DIAGNOSIS — R634 Abnormal weight loss: Secondary | ICD-10-CM | POA: Diagnosis not present

## 2022-07-26 DIAGNOSIS — Z79899 Other long term (current) drug therapy: Secondary | ICD-10-CM | POA: Diagnosis not present

## 2022-07-26 DIAGNOSIS — Z5111 Encounter for antineoplastic chemotherapy: Secondary | ICD-10-CM | POA: Diagnosis not present

## 2022-07-26 DIAGNOSIS — Z933 Colostomy status: Secondary | ICD-10-CM | POA: Diagnosis not present

## 2022-07-26 DIAGNOSIS — Z7901 Long term (current) use of anticoagulants: Secondary | ICD-10-CM | POA: Diagnosis not present

## 2022-07-26 DIAGNOSIS — T451X5A Adverse effect of antineoplastic and immunosuppressive drugs, initial encounter: Secondary | ICD-10-CM | POA: Diagnosis not present

## 2022-07-26 MED ORDER — SODIUM CHLORIDE 0.9% FLUSH
10.0000 mL | INTRAVENOUS | Status: DC | PRN
  Administered 2022-07-26: 10 mL
  Filled 2022-07-26: qty 10

## 2022-07-26 MED ORDER — HEPARIN SOD (PORK) LOCK FLUSH 100 UNIT/ML IV SOLN
500.0000 [IU] | Freq: Once | INTRAVENOUS | Status: AC | PRN
  Administered 2022-07-26: 500 [IU]
  Filled 2022-07-26: qty 5

## 2022-07-26 MED ORDER — PEGFILGRASTIM INJECTION 6 MG/0.6ML ~~LOC~~
6.0000 mg | PREFILLED_SYRINGE | Freq: Once | SUBCUTANEOUS | Status: AC
  Administered 2022-07-26: 6 mg via SUBCUTANEOUS

## 2022-07-28 ENCOUNTER — Encounter: Payer: Self-pay | Admitting: Family Medicine

## 2022-07-31 ENCOUNTER — Encounter: Payer: Self-pay | Admitting: Internal Medicine

## 2022-07-31 ENCOUNTER — Telehealth: Payer: Self-pay | Admitting: *Deleted

## 2022-07-31 ENCOUNTER — Ambulatory Visit (INDEPENDENT_AMBULATORY_CARE_PROVIDER_SITE_OTHER): Payer: Medicare Other | Admitting: Internal Medicine

## 2022-07-31 VITALS — BP 114/76 | HR 77 | Temp 97.1°F | Wt 153.0 lb

## 2022-07-31 DIAGNOSIS — J069 Acute upper respiratory infection, unspecified: Secondary | ICD-10-CM | POA: Diagnosis not present

## 2022-07-31 DIAGNOSIS — Z933 Colostomy status: Secondary | ICD-10-CM | POA: Diagnosis not present

## 2022-07-31 MED ORDER — BENZONATATE 200 MG PO CAPS: 200.0000 mg | ORAL_CAPSULE | Freq: Three times a day (TID) | ORAL | 0 refills | Status: DC | PRN

## 2022-07-31 NOTE — Telephone Encounter (Signed)
Daughter called back stating that the rests they have at home have expired and so she called PCP office and patient is going in to see the APP at 240 today. I did let her know that the gov is giving free tests again and to [facility] the [facility] to order some for free as well as checking to see if the expiration dates on her tests are on the list of test kits that they have extended to expiration date. I asked that she call us bak to let us know what PCP office says about patient. She agrees to

## 2022-07-31 NOTE — Patient Instructions (Signed)
Viral Respiratory Infection A viral respiratory infection is an illness that affects parts of the body that are used for breathing. These include the lungs, nose, and throat. It is caused by a germ called a virus. Some examples of this kind of infection are: A cold. The flu (influenza). A respiratory syncytial virus (RSV) infection. What are the causes? This condition is caused by a virus. It spreads from person to person. You can get the virus if: You breathe in droplets from someone who is sick. You come in contact with people who are sick. You touch mucus or other fluid from a person who is sick. What are the signs or symptoms? Symptoms of this condition include: A stuffy or runny nose. A sore throat. A cough. Shortness of breath. Trouble breathing. Yellow or green fluid in the nose. Other symptoms may include: A fever. Sweating or chills. Tiredness (fatigue). Achy muscles. A headache. How is this treated? This condition may be treated with: Medicines that treat viruses. Medicines that make it easy to breathe. Medicines that are sprayed into the nose. Acetaminophen or NSAIDs, such as ibuprofen, to treat fever. Follow these instructions at home: Managing pain and congestion Take over-the-counter and prescription medicines only as told by your doctor. If you have a sore throat, gargle with salt water. Do this 3-4 times a day or as needed. To make salt water, dissolve -1 tsp (3-6 g) of salt in 1 cup (237 mL) of warm water. Make sure that all the salt dissolves. Use nose drops made from salt water. This helps with stuffiness (congestion). It also helps soften the skin around your nose. Take 2 tsp (10 mL) of honey at bedtime to lessen coughing at night. Do not give honey to children who are younger than 1 year old. Drink enough fluid to keep your pee (urine) pale yellow. General instructions  Rest as much as possible. Do not drink alcohol. Do not smoke or use any products  that contain nicotine or tobacco. If you need help quitting, ask your doctor. Keep all follow-up visits. How is this prevented?     Get a flu shot every year. Ask your doctor when you should get your flu shot. Do not let other people get your germs. If you are sick: Wash your hands with soap and water often. Wash your hands after you cough or sneeze. Wash hands for at least 20 seconds. If you cannot use soap and water, use hand sanitizer. Cover your mouth when you cough. Cover your nose and mouth when you sneeze. Do not share cups or eating utensils. Clean commonly used objects often. Clean commonly touched surfaces. Stay home from work or school. Avoid contact with people who are sick during cold and flu season. This is in fall and winter. Get help if: Your symptoms last for 10 days or longer. Your symptoms get worse over time. You have very bad pain in your face or forehead. Parts of your jaw or neck get very swollen. You have shortness of breath. Get help right away if: You feel pain or pressure in your chest. You have trouble breathing. You faint or feel like you will faint. You keep vomiting and it gets worse. You feel confused. These symptoms may be an emergency. Get help right away. Call your local emergency services (911 in the [place] Do not wait to see if the symptoms will go away. Do not drive yourself to the hospital. Summary A viral respiratory infection is an illness that affects   parts of the body that are used for breathing. Examples of this illness include a cold, the flu, and a respiratory syncytial virus (RSV) infection. The infection can cause a runny nose, cough, sore throat, and fever. Follow what your doctor tells you about taking medicines, drinking lots of fluid, washing your hands, resting at home, and avoiding people who are sick. This information is not intended to replace advice given to you by your health care provider. Make sure you discuss any questions  you have with your health care provider. Document Revised: 01/06/2021 Document Reviewed: 01/06/2021 Elsevier Patient Education  2023 [employer].  

## 2022-07-31 NOTE — Telephone Encounter (Signed)
Call from daughter to answering service. reporting that patient has a URI. I returned call and she is having a cough and runny nose with clear mucous. She does not have a headache nor fever or any other symptoms and she had spoken to her PCP who felt it to be viral. I have requested that she do a COVID test and call me back with results and she agreed to do so.

## 2022-07-31 NOTE — Addendum Note (Signed)
Addended by: Ashley Royalty E on: 07/31/2022 02:57 PM   Modules accepted: Orders

## 2022-07-31 NOTE — Progress Notes (Signed)
Subjective:    Patient ID: Jacqueline Yoder, female    DOB: 1946-03-19, 76 y.o.   MRN: [id number]  HPI  Patient presents to clinic today with complaint of runny nose, hoarseness and cough.  This started 6 days ago.  She is blowing clear mucous out of her nose. The cough is productive of clear mucous. She denies headache, nasal congestion, ear pain, sore throat, shortness of breath or chest pain. She denies fever, chills or body aches. She has tried Tylenol and Vit C OTC. She is currently undergoing treatment for rectal cancer. She has not had sick contacts that she is aware of. She has not taken a home covid test.  Review of Systems     Past Medical History:  Diagnosis Date   Allergy    Arthritis    Blood clot in vein    Family history of colon cancer    GERD (gastroesophageal reflux disease)    Hypercholesteremia    Hypertension    Hypertension    Lower extremity edema    Personal history of chemotherapy    Rectal cancer ([place]) 12/2018   Urinary incontinence     Current Outpatient Medications  Medication Sig Dispense Refill   Cholecalciferol (VITAMIN D3) 2000 units capsule Take 2,000 Units by mouth daily.     cholestyramine (QUESTRAN) 4 g packet Take 1 packet (4 g total) by mouth 3 (three) times daily. 90 each 1   diclofenac sodium (VOLTAREN) 1 % GEL Apply 2 g topically 4 (four) times daily as needed (joint pain).  11   diphenoxylate-atropine (LOMOTIL) 2.5-0.025 MG tablet Take 1 tablet by mouth 4 (four) times daily as needed for diarrhea or loose stools. 30 tablet 1   ELIQUIS 2.5 MG TABS tablet TAKE 1 TABLET BY MOUTH TWICE  DAILY 60 tablet 11   fluticasone (FLONASE) 50 MCG/ACT nasal spray USE 1 SPRAY IN EACH NOSTRIL ONCE DAILY 16 g 3   gabapentin (NEURONTIN) 100 MG capsule Take 1 capsule (100 mg total) by mouth at bedtime. 30 capsule 0   ipratropium (ATROVENT) 0.03 % nasal spray USE 1 SPRAY IN BOTH NOSTRILS  TWICE DAILY 30 mL 11   lidocaine-prilocaine (EMLA) cream Apply 1  application. topically as needed. 30 g 6   loperamide (IMODIUM) 2 MG capsule Take 1 capsule (2 mg total) by mouth See admin instructions. With onset of loose stool, take '4mg'$  followed by '2mg'$  every 2 hours,  Maximum: 16 mg/day 120 capsule 1   loratadine (CLARITIN) 10 MG tablet Take 10 mg by mouth daily.     Multiple Vitamins-Minerals (ONE-A-DAY WOMENS 50 PLUS PO) Take 1 tablet by mouth daily.      potassium chloride SA (KLOR-CON M) 20 MEQ tablet Take 2 tablets (40 mEq total) by mouth daily. 60 tablet 5   potassium chloride SA (KLOR-CON M) 20 MEQ tablet Take 1 tablet (20 mEq total) by mouth 2 (two) times daily. 20 tablet 0   simvastatin (ZOCOR) 40 MG tablet Take 1 tablet (40 mg total) by mouth at bedtime. 100 tablet 3   triamterene-hydrochlorothiazide (DYAZIDE) 37.5-25 MG capsule Take 1 each (1 capsule total) by mouth daily. 100 capsule 3   zinc gluconate 50 MG tablet Take 50 mg by mouth daily.     No current facility-administered medications for this visit.   Facility-Administered Medications Ordered in Other Visits  Medication Dose Route Frequency Provider Last Rate Last Admin   diphenhydrAMINE (BENADRYL) injection 25 mg  25 mg Intravenous Daily PRN Tasia Catchings,  Talbert Cage, MD   25 mg at 06/26/22 1405    Allergies  Allergen Reactions   Sulfamethoxazole-Trimethoprim Other (See Comments)    Also said she had chills and pressure in nose.    Family History  Problem Relation Age of Onset   Colon cancer Brother 40       exposure to chemicals [place]   Hypertension Mother    Stroke Mother    Kidney failure Father    Breast cancer Neg Hx    Ovarian cancer Neg Hx     Social History   Socioeconomic History   Marital status: Widowed    Spouse name: Not on file   Number of children: Not on file   Years of education: Not on file   Highest education level: Not on file  Occupational History   Not on file  Tobacco Use   Smoking status: Former    Types: Cigarettes    Quit date: 12/02/1977    Years  since quitting: 44.6   Smokeless tobacco: Former  Scientific laboratory technician Use: Never used  Substance and Sexual Activity   Alcohol use: Never   Drug use: Never   Sexual activity: Not Currently    Birth control/protection: None  Other Topics Concern   Not on file  Social History Narrative   Lives with daughter   Social Determinants of Health   Financial Resource Strain: Medium Risk (06/30/2022)   Overall Financial Resource Strain (CARDIA)    Difficulty of Paying Living Expenses: Somewhat hard  Food Insecurity: No Food Insecurity (06/30/2022)   Hunger Vital Sign    Worried About Running Out of Food in the Last Year: Never true    Ran Out of Food in the Last Year: Never true  Transportation Needs: No Transportation Needs (06/30/2022)   PRAPARE - Hydrologist (Medical): No    Lack of Transportation (Non-Medical): No  Physical Activity: Inactive (06/30/2022)   Exercise Vital Sign    Days of Exercise per Week: 0 days    Minutes of Exercise per Session: 0 min  Stress: No Stress Concern Present (06/30/2022)   [place]    Feeling of Stress : Not at all  Social Connections: Moderately Isolated (06/30/2022)   Social Connection and Isolation Panel [NHANES]    Frequency of Communication with Friends and Family: More than three times a week    Frequency of Social Gatherings with Friends and Family: Twice a week    Attends Religious Services: More than 4 times per year    Active Member of [employer] or Organizations: No    Attends Archivist Meetings: Never    Marital Status: Widowed  Intimate Partner Violence: Not At Risk (06/30/2022)   Humiliation, Afraid, Rape, and Kick questionnaire    Fear of Current or Ex-Partner: No    Emotionally Abused: No    Physically Abused: No    Sexually Abused: No     Constitutional: Denies fever, malaise, fatigue, headache or abrupt weight changes.  HEENT:  Patient reports runny nose.  Denies eye pain, eye redness, ear pain, ringing in the ears, wax buildup, nasal congestion, bloody nose, or sore throat. Respiratory: Patient reports cough.  Denies difficulty breathing, shortness of breath.   Cardiovascular: Denies chest pain, chest tightness, palpitations or swelling in the hands or feet.   No other specific complaints in a complete review of systems (except as listed in HPI above).  Objective:   Physical Exam BP 114/76 (BP Location: Right Arm, Patient Position: Sitting, Cuff Size: Normal)   Pulse 77   Temp (!) 97.1 F (36.2 C) (Temporal)   Wt 153 lb (69.4 kg)   SpO2 99%   BMI 26.26 kg/m   Wt Readings from Last 3 Encounters:  07/24/22 155 lb 6.4 oz (70.5 kg)  07/10/22 159 lb 3.2 oz (72.2 kg)  07/05/22 157 lb (71.2 kg)    General: Appears her stated age, overweight in NAD. Skin: Warm, dry and intact. HEENT: Head: normal shape and size; Eyes: sclera white, no icterus, conjunctiva pink, PERRLA and EOMs intact;  Nose: mucosa pink and moist, septum midline; Throat/Mouth: Teeth present, mucosa pink and moist, + PND, no exudate, lesions or ulcerations noted.  Neck: No adenopathy noted. Cardiovascular: Normal rate and rhythm. S1,S2 noted.  No murmur, rubs or gallops noted.  Pulmonary/Chest: Normal effort and positive vesicular breath sounds. No respiratory distress. No wheezes, rales or ronchi noted.  Neurological: Alert and oriented.  BMET    Component Value Date/Time   NA 137 07/24/2022 0840   K 4.3 07/24/2022 0840   CL 105 07/24/2022 0840   CO2 27 07/24/2022 0840   GLUCOSE 94 07/24/2022 0840   BUN 15 07/24/2022 0840   CREATININE 0.94 07/24/2022 0840   CREATININE 1.10 (H) 06/24/2021 0802   CALCIUM 9.0 07/24/2022 0840   GFRNONAA >60 07/24/2022 0840   GFRAA >60 07/16/2020 0802    Lipid Panel     Component Value Date/Time   CHOL 170 07/05/2022 1042   TRIG 92 07/05/2022 1042   HDL 75 07/05/2022 1042   CHOLHDL 2.3 07/05/2022  1042   LDLCALC 77 07/05/2022 1042    CBC    Component Value Date/Time   WBC 4.6 07/24/2022 0840   RBC 3.88 07/24/2022 0840   HGB 11.7 (L) 07/24/2022 0840   HCT 36.3 07/24/2022 0840   PLT 176 07/24/2022 0840   MCV 93.6 07/24/2022 0840   MCH 30.2 07/24/2022 0840   MCHC 32.2 07/24/2022 0840   RDW 17.4 (H) 07/24/2022 0840   LYMPHSABS 1.3 07/24/2022 0840   MONOABS 0.6 07/24/2022 0840   EOSABS 0.1 07/24/2022 0840   BASOSABS 0.0 07/24/2022 0840    Hgb A1C Lab Results  Component Value Date   HGBA1C 5.3 06/24/2021            Assessment & Plan:   Viral URI with cough:  Encourage rest and fluids We will check RSV/COVID/flu Use Atrovent twice daily as previously prescribed Rx for Tessalon 200 mg 3 times daily as needed for cough No indication for antibiotics or steroids at this time  Return precautions discussed Webb Silversmith, NP

## 2022-08-01 ENCOUNTER — Other Ambulatory Visit: Payer: Self-pay | Admitting: Oncology

## 2022-08-01 ENCOUNTER — Inpatient Hospital Stay (HOSPITAL_BASED_OUTPATIENT_CLINIC_OR_DEPARTMENT_OTHER): Payer: Medicare Other | Admitting: Nurse Practitioner

## 2022-08-01 DIAGNOSIS — U071 COVID-19: Secondary | ICD-10-CM | POA: Diagnosis not present

## 2022-08-01 DIAGNOSIS — C799 Secondary malignant neoplasm of unspecified site: Secondary | ICD-10-CM

## 2022-08-01 DIAGNOSIS — C20 Malignant neoplasm of rectum: Secondary | ICD-10-CM

## 2022-08-01 LAB — SARS-COV-2 RNA, INFLUENZA A/B, AND RSV RNA, QUALITATIVE NAAT
INFLUENZA A RNA: NOT DETECTED
INFLUENZA B RNA: NOT DETECTED
RSV RNA: NOT DETECTED
SARS COV2 RNA: DETECTED — AB

## 2022-08-01 NOTE — Telephone Encounter (Signed)
Dr. Tasia Catchings would like for pt to be evalauted to see if she is a candidate for Paxlovid.    Treatment appts will be moved to be approx 2 weeks from today. IS date has been updated. Please r/s appts and inform pt of new appt date.

## 2022-08-01 NOTE — Progress Notes (Signed)
Virtual Visit Progress Note  I connected with Jacqueline Yoder on 08/01/22 at 12:45 PM EDT by video enabled telemedicine visit and verified that I am speaking with the correct person using two identifiers.   I discussed the limitations, risks, security and privacy concerns of performing an evaluation and management service by telemedicine and the availability of in-person appointments. I also discussed with the patient that there may be a patient responsible charge related to this service. The patient expressed understanding and agreed to proceed.   Other persons participating in the visit and their role in the encounter: none   Patient's location: home   Provider's location: clinic  Chief Complaint: Positive covid test    Patient Care Team: Olin Hauser, DO as PCP - General (Family Medicine) Clent Jacks, RN as Registered Nurse Earlie Server, MD as Consulting Physician (Hematology and Oncology) Cammie Sickle, MD as Consulting Physician (Oncology)   Name of the patient: Jacqueline Yoder  [id number]  Jun 24, 1946   Date of visit: 08/01/22  History of Presenting Illness- Patient is 76 year old female diagnosed with rectal cancer, currently undergoing chemotherapy with FOLFIRI and bevacizumab who agrees to virtual visit for evaluation of covid infection. She developed runny nose, hoarseness 2 days ago. Now has cough. Describes mucous as clear. No headache, shortness of breath, fever, chills or body aches.   Review of systems- Review of Systems  Constitutional:  Positive for malaise/fatigue. Negative for chills, diaphoresis, fever and weight loss.  HENT:  Positive for congestion and sore throat. Negative for ear discharge, ear pain, nosebleeds and sinus pain.   Eyes:  Negative for pain and redness.  Respiratory:  Positive for cough. Negative for hemoptysis, sputum production, shortness of breath, wheezing and stridor.   Cardiovascular:  Negative for chest pain, palpitations  and leg swelling.  Gastrointestinal:  Negative for abdominal pain, constipation, diarrhea, nausea and vomiting.  Musculoskeletal:  Negative for falls, joint pain and myalgias.  Skin:  Negative for rash.  Neurological:  Negative for dizziness, loss of consciousness, weakness and headaches.  Psychiatric/Behavioral:  Negative for depression. The patient is not nervous/anxious and does not have insomnia.   All other systems reviewed and are negative.    Allergies  Allergen Reactions   Sulfamethoxazole-Trimethoprim Other (See Comments)    Also said she had chills and pressure in nose.    Past Medical History:  Diagnosis Date   Allergy    Arthritis    Blood clot in vein    Family history of colon cancer    GERD (gastroesophageal reflux disease)    Hypercholesteremia    Hypertension    Hypertension    Lower extremity edema    Personal history of chemotherapy    Rectal cancer ([place]) 12/2018   Urinary incontinence     Past Surgical History:  Procedure Laterality Date   ABDOMINAL HYSTERECTOMY     CHOLECYSTECTOMY  1971   COLONOSCOPY WITH PROPOFOL N/A 12/03/2018   Procedure: COLONOSCOPY WITH PROPOFOL;  Surgeon: Lucilla Lame, MD;  Location: [facility] ENDOSCOPY;  Service: Endoscopy;  Laterality: N/A;   COLONOSCOPY WITH PROPOFOL N/A 07/15/2020   Procedure: COLONOSCOPY WITH PROPOFOL;  Surgeon: [name], MD;  Location: [facility] ENDOSCOPY;  Service: Gastroenterology;  Laterality: N/A;   FLEXIBLE SIGMOIDOSCOPY N/A 12/06/2018   Procedure: FLEXIBLE SIGMOIDOSCOPY;  Surgeon: [name], MD;  Location: [facility] ENDOSCOPY;  Service: Endoscopy;  Laterality: N/A;   LAPAROSCOPIC COLOSTOMY  01/06/2019   PORTACATH PLACEMENT N/A 04/03/2019   Procedure: INSERTION PORT-A-CATH;  Surgeon: Jules Husbands, MD;  Location: [facility] ORS;  Service: General;  Laterality: N/A;    Social History   Socioeconomic History   Marital status: Widowed    Spouse name: Not on file   Number of children: Not on file   Years of  education: Not on file   Highest education level: Not on file  Occupational History   Not on file  Tobacco Use   Smoking status: Former    Types: Cigarettes    Quit date: 12/02/1977    Years since quitting: 44.6   Smokeless tobacco: Former  Scientific laboratory technician Use: Never used  Substance and Sexual Activity   Alcohol use: Never   Drug use: Never   Sexual activity: Not Currently    Birth control/protection: None  Other Topics Concern   Not on file  Social History Narrative   Lives with daughter   Social Determinants of Health   Financial Resource Strain: Medium Risk (06/30/2022)   Overall Financial Resource Strain (CARDIA)    Difficulty of Paying Living Expenses: Somewhat hard  Food Insecurity: No Food Insecurity (06/30/2022)   Hunger Vital Sign    Worried About Running Out of Food in the Last Year: Never true    Ran Out of Food in the Last Year: Never true  Transportation Needs: No Transportation Needs (06/30/2022)   PRAPARE - Hydrologist (Medical): No    Lack of Transportation (Non-Medical): No  Physical Activity: Inactive (06/30/2022)   Exercise Vital Sign    Days of Exercise per Week: 0 days    Minutes of Exercise per Session: 0 min  Stress: No Stress Concern Present (06/30/2022)   [place]    Feeling of Stress : Not at all  Social Connections: Moderately Isolated (06/30/2022)   Social Connection and Isolation Panel [NHANES]    Frequency of Communication with Friends and Family: More than three times a week    Frequency of Social Gatherings with Friends and Family: Twice a week    Attends Religious Services: More than 4 times per year    Active Member of [employer] or Organizations: No    Attends Archivist Meetings: Never    Marital Status: Widowed  Intimate Partner Violence: Not At Risk (06/30/2022)   Humiliation, Afraid, Rape, and Kick questionnaire    Fear of Current  or Ex-Partner: No    Emotionally Abused: No    Physically Abused: No    Sexually Abused: No    Immunization History  Administered Date(s) Administered   Fluad Quad(high Dose 65+) 07/23/2020, 06/27/2021, 07/05/2022   Influenza Inj Mdck Quad Pf 10/03/2016, 08/26/2019   Influenza Split 10/01/2015   Influenza-Unspecified 08/08/2017, 07/30/2018, 07/18/2019   PFIZER Comirnaty(Gray Top)Covid-19 Tri-Sucrose Vaccine 12/01/2019, 12/23/2019   PFIZER(Purple Top)SARS-COV-2 Vaccination 12/01/2019, 12/23/2019, 08/02/2020   Pneumococcal Conjugate-13 10/24/2016   Pneumococcal Polysaccharide-23 03/30/2015    Family History  Problem Relation Age of Onset   Colon cancer Brother 9       exposure to chemicals [place]   Hypertension Mother    Stroke Mother    Kidney failure Father    Breast cancer Neg Hx    Ovarian cancer Neg Hx      Current Outpatient Medications:    benzonatate (TESSALON) 200 MG capsule, Take 1 capsule (200 mg total) by mouth 3 (three) times daily as needed for cough., Disp: 30 capsule, Rfl: 0   Cholecalciferol (VITAMIN D3)  2000 units capsule, Take 2,000 Units by mouth daily., Disp: , Rfl:    cholestyramine (QUESTRAN) 4 g packet, Take 1 packet (4 g total) by mouth 3 (three) times daily., Disp: 90 each, Rfl: 1   diclofenac sodium (VOLTAREN) 1 % GEL, Apply 2 g topically 4 (four) times daily as needed (joint pain)., Disp: , Rfl: 11   diphenoxylate-atropine (LOMOTIL) 2.5-0.025 MG tablet, Take 1 tablet by mouth 4 (four) times daily as needed for diarrhea or loose stools., Disp: 30 tablet, Rfl: 1   ELIQUIS 2.5 MG TABS tablet, TAKE 1 TABLET BY MOUTH TWICE  DAILY, Disp: 60 tablet, Rfl: 11   fluticasone (FLONASE) 50 MCG/ACT nasal spray, USE 1 SPRAY IN EACH NOSTRIL ONCE DAILY, Disp: 16 g, Rfl: 3   gabapentin (NEURONTIN) 100 MG capsule, Take 1 capsule (100 mg total) by mouth at bedtime., Disp: 30 capsule, Rfl: 0   lidocaine-prilocaine (EMLA) cream, Apply 1 application. topically as needed.,  Disp: 30 g, Rfl: 6   loperamide (IMODIUM) 2 MG capsule, Take 1 capsule (2 mg total) by mouth See admin instructions. With onset of loose stool, take '4mg'$  followed by '2mg'$  every 2 hours,  Maximum: 16 mg/day, Disp: 120 capsule, Rfl: 1   loratadine (CLARITIN) 10 MG tablet, Take 10 mg by mouth daily., Disp: , Rfl:    Multiple Vitamins-Minerals (ONE-A-DAY WOMENS 50 PLUS PO), Take 1 tablet by mouth daily. , Disp: , Rfl:    potassium chloride SA (KLOR-CON M) 20 MEQ tablet, Take 2 tablets (40 mEq total) by mouth daily. (Patient taking differently: Take 40 mEq by mouth 2 (two) times daily.), Disp: 60 tablet, Rfl: 5   potassium chloride SA (KLOR-CON M) 20 MEQ tablet, Take 1 tablet (20 mEq total) by mouth 2 (two) times daily., Disp: 20 tablet, Rfl: 0   simvastatin (ZOCOR) 40 MG tablet, Take 1 tablet (40 mg total) by mouth at bedtime., Disp: 100 tablet, Rfl: 3   triamterene-hydrochlorothiazide (DYAZIDE) 37.5-25 MG capsule, Take 1 each (1 capsule total) by mouth daily., Disp: 100 capsule, Rfl: 3   zinc gluconate 50 MG tablet, Take 50 mg by mouth daily., Disp: , Rfl:  No current facility-administered medications for this visit.  Facility-Administered Medications Ordered in Other Visits:    diphenhydrAMINE (BENADRYL) injection 25 mg, 25 mg, Intravenous, Daily PRN, Earlie Server, MD, 25 mg at 06/26/22 1405  Physical exam: Exam limited due to telemedicine Physical Exam HENT:     Nose: Congestion present.  Neurological:     Mental Status: She is oriented to person, place, and time.  Psychiatric:        Mood and Affect: Mood normal.        Thought Content: Thought content normal.     Assessment and plan- Patient is a 76 y.o. female   Covid-19 Virus Infection - I advised patient to stay well hydrated, particularly in the event of sustained or high fevers, in whom insensible fluid losses may be significant - Cough that is persistent, interferes with sleep, or causes discomfort can be managed with an  over-the-counter cough medication (eg, dextromethorphan) or prescription benzonatate, 100 to 200 mg orally three times daily as needed. This was prescribed by PCP yesterday.  - I recommend rest as needed during the acute illness and frequent repositioning ambulation is encouraged. In addition, we encourage all patients to advance activity as soon as tolerated during recovery. - take mucinex to thin secretions - take tylenol and/or ibuprofen per label as needed for pain/fever/ body aches. Do  not exceed 3000 mg in 24 hour period of tylenol - Reviewed symptoms that would warrant ER or hospital care - encouraged patient to monitor oxygen levels with pulse oximeter, reviewed troubleshooting, and to seek ER for SpO2 < 94% - CDC criteria for quarantine and isolation reviewed - given that symptoms started 6 days ago, no role for antiviral.   2. Chest congestion  - Flonase or nasocort as needed. Afrin for nasal congestion no longer than 5 days.   3. Rectal cancer- patient's appointments will be adjusted to allow for quarantine. Shouldn't interfere with chemotherapy based on her current schedule. Follow up with Dr. Tasia Catchings as planned.   Visit Diagnosis 1. COVID-19 virus infection    Patient expressed understanding and was in agreement with this plan. She also understands that She can call clinic at any time with any questions, concerns, or complaints.   I discussed the assessment and treatment plan with the patient. The patient was provided an opportunity to ask questions and all were answered. The patient agreed with the plan and demonstrated an understanding of the instructions.   The patient was advised to call back or seek an in-person evaluation if the symptoms worsen or if the condition fails to improve as anticipated.   I spent 25 minutes non face-to-face telephone visit time dedicated to the care of this patient on the date of this encounter.   Thank you for allowing me to participate in the care  of this very pleasant patient.   Beckey Rutter, DNP, AGNP-C [place]

## 2022-08-01 NOTE — Telephone Encounter (Signed)
Mychart apt made for patient to discuss covid+ antiviral.

## 2022-08-03 ENCOUNTER — Ambulatory Visit: Payer: Medicare Other | Admitting: Family Medicine

## 2022-08-03 DIAGNOSIS — Z933 Colostomy status: Secondary | ICD-10-CM | POA: Diagnosis not present

## 2022-08-07 ENCOUNTER — Ambulatory Visit: Payer: Medicare Other

## 2022-08-07 ENCOUNTER — Ambulatory Visit: Payer: Medicare Other | Admitting: Oncology

## 2022-08-07 ENCOUNTER — Other Ambulatory Visit: Payer: Medicare Other

## 2022-08-11 MED FILL — Dexamethasone Sodium Phosphate Inj 100 MG/10ML: INTRAMUSCULAR | Qty: 1 | Status: AC

## 2022-08-14 ENCOUNTER — Inpatient Hospital Stay: Payer: Medicare Other

## 2022-08-14 ENCOUNTER — Encounter: Payer: Self-pay | Admitting: Oncology

## 2022-08-14 ENCOUNTER — Inpatient Hospital Stay (HOSPITAL_BASED_OUTPATIENT_CLINIC_OR_DEPARTMENT_OTHER): Payer: Medicare Other | Admitting: Oncology

## 2022-08-14 VITALS — BP 142/68 | HR 96 | Temp 98.4°F | Wt 156.8 lb

## 2022-08-14 DIAGNOSIS — R634 Abnormal weight loss: Secondary | ICD-10-CM | POA: Diagnosis not present

## 2022-08-14 DIAGNOSIS — C799 Secondary malignant neoplasm of unspecified site: Secondary | ICD-10-CM

## 2022-08-14 DIAGNOSIS — D6481 Anemia due to antineoplastic chemotherapy: Secondary | ICD-10-CM

## 2022-08-14 DIAGNOSIS — T451X5A Adverse effect of antineoplastic and immunosuppressive drugs, initial encounter: Secondary | ICD-10-CM | POA: Diagnosis not present

## 2022-08-14 DIAGNOSIS — Z5111 Encounter for antineoplastic chemotherapy: Secondary | ICD-10-CM

## 2022-08-14 DIAGNOSIS — Z7901 Long term (current) use of anticoagulants: Secondary | ICD-10-CM | POA: Diagnosis not present

## 2022-08-14 DIAGNOSIS — E876 Hypokalemia: Secondary | ICD-10-CM | POA: Diagnosis not present

## 2022-08-14 DIAGNOSIS — I700 Atherosclerosis of aorta: Secondary | ICD-10-CM | POA: Diagnosis not present

## 2022-08-14 DIAGNOSIS — Z79899 Other long term (current) drug therapy: Secondary | ICD-10-CM | POA: Diagnosis not present

## 2022-08-14 DIAGNOSIS — R197 Diarrhea, unspecified: Secondary | ICD-10-CM | POA: Diagnosis not present

## 2022-08-14 DIAGNOSIS — Z86711 Personal history of pulmonary embolism: Secondary | ICD-10-CM | POA: Diagnosis not present

## 2022-08-14 DIAGNOSIS — Z86718 Personal history of other venous thrombosis and embolism: Secondary | ICD-10-CM | POA: Diagnosis not present

## 2022-08-14 DIAGNOSIS — K435 Parastomal hernia without obstruction or  gangrene: Secondary | ICD-10-CM | POA: Diagnosis not present

## 2022-08-14 DIAGNOSIS — D701 Agranulocytosis secondary to cancer chemotherapy: Secondary | ICD-10-CM

## 2022-08-14 DIAGNOSIS — C20 Malignant neoplasm of rectum: Secondary | ICD-10-CM

## 2022-08-14 DIAGNOSIS — I10 Essential (primary) hypertension: Secondary | ICD-10-CM | POA: Diagnosis not present

## 2022-08-14 DIAGNOSIS — K219 Gastro-esophageal reflux disease without esophagitis: Secondary | ICD-10-CM | POA: Diagnosis not present

## 2022-08-14 DIAGNOSIS — Z933 Colostomy status: Secondary | ICD-10-CM | POA: Diagnosis not present

## 2022-08-14 DIAGNOSIS — E7800 Pure hypercholesterolemia, unspecified: Secondary | ICD-10-CM | POA: Diagnosis not present

## 2022-08-14 LAB — CBC WITH DIFFERENTIAL/PLATELET
Abs Immature Granulocytes: 0.03 10*3/uL (ref 0.00–0.07)
Basophils Absolute: 0 10*3/uL (ref 0.0–0.1)
Basophils Relative: 0 %
Eosinophils Absolute: 0.2 10*3/uL (ref 0.0–0.5)
Eosinophils Relative: 3 %
HCT: 38 % (ref 36.0–46.0)
Hemoglobin: 11.8 g/dL — ABNORMAL LOW (ref 12.0–15.0)
Immature Granulocytes: 1 %
Lymphocytes Relative: 29 %
Lymphs Abs: 1.7 10*3/uL (ref 0.7–4.0)
MCH: 29.9 pg (ref 26.0–34.0)
MCHC: 31.1 g/dL (ref 30.0–36.0)
MCV: 96.4 fL (ref 80.0–100.0)
Monocytes Absolute: 0.7 10*3/uL (ref 0.1–1.0)
Monocytes Relative: 12 %
Neutro Abs: 3.4 10*3/uL (ref 1.7–7.7)
Neutrophils Relative %: 55 %
Platelets: 215 10*3/uL (ref 150–400)
RBC: 3.94 MIL/uL (ref 3.87–5.11)
RDW: 18.2 % — ABNORMAL HIGH (ref 11.5–15.5)
WBC: 6.1 10*3/uL (ref 4.0–10.5)
nRBC: 0 % (ref 0.0–0.2)

## 2022-08-14 LAB — COMPREHENSIVE METABOLIC PANEL
ALT: 16 U/L (ref 0–44)
AST: 29 U/L (ref 15–41)
Albumin: 3.9 g/dL (ref 3.5–5.0)
Alkaline Phosphatase: 60 U/L (ref 38–126)
Anion gap: 4 — ABNORMAL LOW (ref 5–15)
BUN: 14 mg/dL (ref 8–23)
CO2: 26 mmol/L (ref 22–32)
Calcium: 9 mg/dL (ref 8.9–10.3)
Chloride: 106 mmol/L (ref 98–111)
Creatinine, Ser: 0.91 mg/dL (ref 0.44–1.00)
GFR, Estimated: >60 mL/min (ref >60)
Glucose, Bld: 97 mg/dL (ref 70–99)
Potassium: 4.2 mmol/L (ref 3.5–5.1)
Sodium: 136 mmol/L (ref 135–145)
Total Bilirubin: 0.5 mg/dL (ref 0.3–1.2)
Total Protein: 6.9 g/dL (ref 6.5–8.1)

## 2022-08-14 LAB — PROTEIN, URINE, RANDOM: Total Protein, Urine: 20 mg/dL

## 2022-08-14 MED ORDER — ACETAMINOPHEN 325 MG PO TABS
650.0000 mg | ORAL_TABLET | Freq: Four times a day (QID) | ORAL | Status: DC | PRN
  Administered 2022-08-14: 650 mg via ORAL
  Filled 2022-08-14: qty 2

## 2022-08-14 MED ORDER — SODIUM CHLORIDE 0.9 % IV SOLN
Freq: Once | INTRAVENOUS | Status: AC
  Filled 2022-08-14: qty 250

## 2022-08-14 MED ORDER — ATROPINE SULFATE 1 MG/ML IV SOLN
0.5000 mg | Freq: Once | INTRAVENOUS | Status: AC | PRN
  Administered 2022-08-14: 0.5 mg via INTRAVENOUS
  Filled 2022-08-14: qty 1

## 2022-08-14 MED ORDER — SODIUM CHLORIDE 0.9 % IV SOLN
100.0000 mg/m2 | Freq: Once | INTRAVENOUS | Status: AC
  Administered 2022-08-14: 180 mg via INTRAVENOUS
  Filled 2022-08-14: qty 4

## 2022-08-14 MED ORDER — DIPHENHYDRAMINE HCL 50 MG/ML IJ SOLN
25.0000 mg | Freq: Every day | INTRAMUSCULAR | Status: DC | PRN
  Administered 2022-08-14: 25 mg via INTRAVENOUS
  Filled 2022-08-14: qty 1

## 2022-08-14 MED ORDER — SODIUM CHLORIDE 0.9 % IV SOLN
2400.0000 mg/m2 | INTRAVENOUS | Status: DC
  Administered 2022-08-14: 4300 mg via INTRAVENOUS
  Filled 2022-08-14: qty 86

## 2022-08-14 MED ORDER — SODIUM CHLORIDE 0.9 % IV SOLN
10.0000 mg | Freq: Once | INTRAVENOUS | Status: AC
  Administered 2022-08-14: 10 mg via INTRAVENOUS
  Filled 2022-08-14: qty 10

## 2022-08-14 MED ORDER — PALONOSETRON HCL INJECTION 0.25 MG/5ML
0.2500 mg | Freq: Once | INTRAVENOUS | Status: AC
  Administered 2022-08-14: 0.25 mg via INTRAVENOUS
  Filled 2022-08-14: qty 5

## 2022-08-14 MED ORDER — SODIUM CHLORIDE 0.9 % IV SOLN
5.0000 mg/kg | Freq: Once | INTRAVENOUS | Status: AC
  Administered 2022-08-14: 350 mg via INTRAVENOUS
  Filled 2022-08-14: qty 14

## 2022-08-14 MED ORDER — SODIUM CHLORIDE 0.9 % IV SOLN
750.0000 mg | Freq: Once | INTRAVENOUS | Status: AC
  Administered 2022-08-14: 750 mg via INTRAVENOUS
  Filled 2022-08-14: qty 25

## 2022-08-14 NOTE — Progress Notes (Signed)
Hematology/Oncology Progress note [phone] [phone]      Patient Care Team: Jacqueline Hauser, DO as PCP - General (Family Medicine) Jacqueline Jacks, RN as Registered Nurse Jacqueline Server, MD as Consulting Physician (Hematology and Oncology) Jacqueline Sickle, MD as Consulting Physician (Oncology)  ASSESSMENT & PLAN:   Cancer Staging  Rectal cancer [facility]) Staging form: Colon and Rectum, AJCC 8th Edition - Pathologic stage from 10/06/2019: Stage IIC (ypT4b, pN0, cM0) - Signed by Jacqueline Server, MD on 10/06/2019 - Clinical stage from 06/30/2020: Jacqueline Yoder - Signed by Jacqueline Server, MD on 04/17/2022 - Pathologic: Stage Unknown (rpTX, pNX, cM1) - Signed by Jacqueline Server, MD on 01/31/2021   Rectal cancer [facility]) History of stage IIIC Rectal cancer, s/p TNT, followed by 09/17/19 APR/posterior vaginectomy/TAH/BSO/VY-flap, pT4b pN0 with close vaginal margin 0.2 mm.  Uterus and ovaries negative for malignancy. palliative radiation to vaginal recurrence- 01/19/21 recurrence with lung metastasis.-Palliative -FOLFIRI plus bevacizumab.  Irinotecan was dropped in November 2022 due to side effects. Negative for UGT1A1*28 - radiographically stable, rise of CEA-July 2023 CT showed slightly progression of lung metastasis. Labs are reviewed and discussed with patient. CEA progressively increases Proceed with dose reduced FOLFIRI-[Irinotecan 100 mg/m2] / bevacizumab- CT- bone scan- stable disease  RAS wild type. Consider switch to irinotecan +panitumumab upon significant radiographic progression .  Encounter for antineoplastic chemotherapy Chemotherapy plan as listed above.   History of pulmonary embolism Continue  Eliquis 2.5 mg twice daily for anticoagulation prophylaxis.  Anemia due to antineoplastic chemotherapy Hb is stable. observation  Chemotherapy induced neutropenia ([place])  patient will receive long-acting GCSF   Ziextenzo prophylaxis on D3  Hypokalemia  potassium is  stable.  Continue potassium chloride 40 mEq  daily.    Follow up in 2 weeks. Lab MD next cycle of chemo. All questions were answered. The patient knows to call the clinic with any problems, questions or concerns.  Jacqueline Server, MD, PhD [facility] Hematology Oncology 08/14/2022      CHIEF COMPLAINTS/REASON FOR VISIT:  Follow up for rectal cancer  HISTORY OF PRESENTING ILLNESS:  Patient initially presented with complaints of postmenopausal bleeding on 08/16/2018.  History of was menopausal vaginal bleeding in 2016 which resulted in cervical polypectomy.  Pathology 02/04/2015 showed cervical polyp, consistent with benign endometrial polyp.  [name] after polypectomy due to anxiety associated with pelvic exams.  pelvic exam on 08/16/2018 reviewed cervical abnormality and from enlarged uterus. Seen by Dr. Marcelline Yoder on 10/29/2018.  Endometrial biopsy and a Pap smear was performed. 10/29/2018 Pap smear showed adenocarcinoma, favor endometrial origin. 10/29/2018 endometrial biopsy showed endometrioid carcinoma, FIGO grade 1.  10/29/2018- TA & TV Ultrasound revealed: Anteverted uterus measuring 8.7 x 5.6 x 6.4 cm without evidence of focal masses.  The endometrium measuring 24.1 mm (thickened) and heterogeneous.  Right and left ovaries not visualized.  No adnexal masses identified.  No free fluid in cul-de-sac.  Patient was seen by Dr. Theora Yoder in clinic on 11/13/2018.  Cervical exam reveals 2 cm exophytic irregular mass consistent with malignancy.   11/19/2018 CT chest abdomen pelvis with contrast showed thickened endometrium with some irregularity compatible with the provided diagnosis of endometrial malignancy.  There is a mildly prominent left inguinal node 1.4 cm.  Patient was seen by Dr. Fransisca Yoder on 11/20/2018 and left groin lymph node biopsy was recommended.  11/26/2018 patient underwent left inguinal lymph node biopsy. Pathology showed metastatic adenocarcinoma consistent with colorectal origin.   CDX 2+.  Case was discussed on  tumor board.  Recommend colonoscopy for further evaluation.  Patient reports significant weight loss 30 pounds over the last year.  Chronic vaginal spotting. Change of bowel habits the past few months.  More constipated.  Family history positive for brother who has colon cancer prostate cancer.  patient has underwent colonoscopy on 12/03/2018 which reviewed a nonobstructing large mass in the rectum.  Also chronic fistula.  Mass was not circumferential.  This was biopsied with a cold forceps for histology.  Pathology came back hyperplastic polyp negative for dysplasia and malignancy. [name], patient underwent flex sigmoidoscopy on 12/06/2018 with rebiopsy of the rectal mass. This time biopsy results came back positive for invasive colorectal adenocarcinoma, moderately differentiated. Immunotherapy for nearly mismatch repair protein (MMR ) was performed.  There is no loss of MMR expression.  low probability of MSI high.   # Seen by [name] for evaluation of resectability for rectal cancer. In addition, she also had a second opinion with [facility] pathology where her endometrial biopsy pathology was changed to  adenocarcinoma, consistent with colorectal primary.   [name]. She has home health that has been assisting with ostomy care  Patient was also evaluated by [facility] oncology.  Recommendation is to proceed with TNT with concurrent chemoradiation followed by neoadjuvant chemotherapy followed by surgical resection. Patient prefers to have treatment done locally with [facility].   # Oncology Treatment:  02/03/2019- 03/19/2019  concurrent Xeloda and radiation.  Xeloda dose 894m /m2 BID - rounded to 16582mBID- on days of radiation. 04/09/2019, started on FOLFOX with bolus early.  Omitted.  07/16/2019 finished 8 cycles of FOLFOX. 09/17/19 APR/posterior vaginectomy/TAH/BSO/VY-flap pT4b pN0 with close vaginal margin  0.2 mm.  Uterus and ovaries negative for malignancy. Patient reports bilateral lower extremity numbness and tingling, intermittent, left worse than right. She has lost a lot of weight since her APR surgery.   #Family history with half brother having's history of colon cancer prostate cancer.  Personal history of colorectal cancer.  Patient has not decided if she wants genetic testing.    # history of PE( 01/13/2020)  in the bilateral lower extremity DVT (01/13/2020).   She finishes 6 months of anticoagulation with Eliquis 5 mg twice daily. Now switched to Eliquis 2.5 mg twice daily..  # She has now developed recurrent disease. #06/30/20  vaginal introitus mass biopsied. Pathology is consistent with metastatic colorectal adenocarcinoma I have discussed with [name]  Dr. MaHester Matesnd the mass is not resectable. Patient has also had colonoscopy by Dr. AnVicente Malesesterday. Normal examination. # 07/16/2020 cycle 1 FOLFIRI  # 07/20/2020 PET scan was done for further evaluation, images are consistent with local recurrence, no distant metastasis. #Discussed with radiation oncology Dr. ChBaruch Goutyill recommends concurrent chemotherapy and radiation. 08/02/2020-08/16/2020, patient starts radiation.  Xeloda was held due to neutropenia 08/17/2020,-09/06/2020 Xeloda 1500 mg twice daily concurrently with radiation  01/31/21 started on FOLFIRI + Bev 05/18/2021 CT chest abdomen pelvis showed Previously noted enlargement of bilateral inguinal lymph nodes is resolved, consistent with treatment response of nodal metastatic disease. Interval decrease in size of multiple small bilateral pulmonary nodules, consistent with treatment response of pulmonary metastatic disease. No evidence of new metastatic disease. 05/24/2021 - 08/30/2021, continued on FOLFIRI plus bevacizumab.  Irinotecan dose was reduced, eventually 1006m2  09/02/2021, CT chest abdomen pelvis without contrast Showed small bilateral pulmonary nodules, unchanged.   Stable metastatic disease.  No noncontrast evidence of new metastatic disease in the chest abdomen  pelvis.  Small parastomal hernia.  Enlargement of main pulmonary artery.  Coronary artery disease.  09/13/2021, maintenance 5-FU/bevacizumab 11/28/2021, 5-FU/Irinotecan/bevacizumab.  Irinotecan 100 mg/m2 was added back due to progressively increasing CEA.  INTERVAL HISTORY Jacqueline Yoder is a 76 y.o. female who has above history reviewed by me presents for follow-up of rectal cancer. Currently on dose reduced FOLFIRI/bevacizumab   During the interval she had COVID 19 infection. Symptoms are mild.  Today her symptoms are all resolved. She feels well.  + left hip arthritis pain.    Review of Systems  Constitutional:  Positive for fatigue. Negative for appetite change, chills, fever and unexpected weight change.  HENT:   Negative for hearing loss and voice change.   Eyes:  Negative for eye problems.  Respiratory:  Negative for chest tightness and cough.   Cardiovascular:  Negative for chest pain.  Gastrointestinal:  Negative for abdominal distention, abdominal pain, blood in stool, constipation, diarrhea and nausea.  Endocrine: Negative for hot flashes.  Genitourinary:  Negative for difficulty urinating and frequency.   Musculoskeletal:  Positive for arthralgias.  Skin:  Negative for itching and rash.  Neurological:  Negative for extremity weakness and numbness.  Hematological:  Negative for adenopathy.  Psychiatric/Behavioral:  Negative for confusion.     MEDICAL HISTORY:  Past Medical History:  Diagnosis Date   Allergy    Arthritis    Blood clot in vein    Family history of colon cancer    GERD (gastroesophageal reflux disease)    Hypercholesteremia    Hypertension    Hypertension    Lower extremity edema    Personal history of chemotherapy    Rectal cancer ([place]) 12/2018   Urinary incontinence     SURGICAL HISTORY: Past Surgical History:  Procedure Laterality Date    ABDOMINAL HYSTERECTOMY     CHOLECYSTECTOMY  1971   COLONOSCOPY WITH PROPOFOL N/A 12/03/2018   Procedure: COLONOSCOPY WITH PROPOFOL;  Surgeon: Lucilla Lame, MD;  Location: [facility] ENDOSCOPY;  Service: Endoscopy;  Laterality: N/A;   COLONOSCOPY WITH PROPOFOL N/A 07/15/2020   Procedure: COLONOSCOPY WITH PROPOFOL;  Surgeon: [name], MD;  Location: [facility] ENDOSCOPY;  Service: Gastroenterology;  Laterality: N/A;   FLEXIBLE SIGMOIDOSCOPY N/A 12/06/2018   Procedure: FLEXIBLE SIGMOIDOSCOPY;  Surgeon: [name], MD;  Location: [facility] ENDOSCOPY;  Service: Endoscopy;  Laterality: N/A;   LAPAROSCOPIC COLOSTOMY  01/06/2019   PORTACATH PLACEMENT N/A 04/03/2019   Procedure: INSERTION PORT-A-CATH;  Surgeon: Jules Husbands, MD;  Location: [facility] ORS;  Service: General;  Laterality: N/A;    SOCIAL HISTORY: Social History   Socioeconomic History   Marital status: Widowed    Spouse name: Not on file   Number of children: Not on file   Years of education: Not on file   Highest education level: Not on file  Occupational History   Not on file  Tobacco Use   Smoking status: Former    Types: Cigarettes    Quit date: 12/02/1977    Years since quitting: 44.7   Smokeless tobacco: Former  Scientific laboratory technician Use: Never used  Substance and Sexual Activity   Alcohol use: Never   Drug use: Never   Sexual activity: Not Currently    Birth control/protection: None  Other Topics Concern   Not on file  Social History Narrative   Lives with daughter   Social Determinants of Health   Financial Resource Strain: Medium Risk (06/30/2022)   Overall Financial Resource Strain (CARDIA)    Difficulty  of Paying Living Expenses: Somewhat hard  Food Insecurity: No Food Insecurity (06/30/2022)   Hunger Vital Sign    Worried About Running Out of Food in the Last Year: Never true    Ran Out of Food in the Last Year: Never true  Transportation Needs: No Transportation Needs (06/30/2022)   PRAPARE - Radiographer, therapeutic (Medical): No    Lack of Transportation (Non-Medical): No  Physical Activity: Inactive (06/30/2022)   Exercise Vital Sign    Days of Exercise per Week: 0 days    Minutes of Exercise per Session: 0 min  Stress: No Stress Concern Present (06/30/2022)   [place]    Feeling of Stress : Not at all  Social Connections: Moderately Isolated (06/30/2022)   Social Connection and Isolation Panel [NHANES]    Frequency of Communication with Friends and Family: More than three times a week    Frequency of Social Gatherings with Friends and Family: Twice a week    Attends Religious Services: More than 4 times per year    Active Member of [employer] or Organizations: No    Attends Archivist Meetings: Never    Marital Status: Widowed  Intimate Partner Violence: Not At Risk (06/30/2022)   Humiliation, Afraid, Rape, and Kick questionnaire    Fear of Current or Ex-Partner: No    Emotionally Abused: No    Physically Abused: No    Sexually Abused: No    FAMILY HISTORY: Family History  Problem Relation Age of Onset   Colon cancer Brother 87       exposure to chemicals [place]   Hypertension Mother    Stroke Mother    Kidney failure Father    Breast cancer Neg Hx    Ovarian cancer Neg Hx     ALLERGIES:  is allergic to sulfamethoxazole-trimethoprim.  MEDICATIONS:  Current Outpatient Medications  Medication Sig Dispense Refill   benzonatate (TESSALON) 200 MG capsule Take 1 capsule (200 mg total) by mouth 3 (three) times daily as needed for cough. 30 capsule 0   Cholecalciferol (VITAMIN D3) 2000 units capsule Take 2,000 Units by mouth daily.     cholestyramine (QUESTRAN) 4 g packet Take 1 packet (4 g total) by mouth 3 (three) times daily. 90 each 1   diclofenac sodium (VOLTAREN) 1 % GEL Apply 2 g topically 4 (four) times daily as needed (joint pain).  11   diphenoxylate-atropine (LOMOTIL) 2.5-0.025 MG tablet Take  1 tablet by mouth 4 (four) times daily as needed for diarrhea or loose stools. 30 tablet 1   ELIQUIS 2.5 MG TABS tablet TAKE 1 TABLET BY MOUTH TWICE  DAILY 60 tablet 11   fluticasone (FLONASE) 50 MCG/ACT nasal spray USE 1 SPRAY IN EACH NOSTRIL ONCE DAILY 16 g 3   gabapentin (NEURONTIN) 100 MG capsule Take 1 capsule (100 mg total) by mouth at bedtime. 30 capsule 0   lidocaine-prilocaine (EMLA) cream Apply 1 application. topically as needed. 30 g 6   loperamide (IMODIUM) 2 MG capsule Take 1 capsule (2 mg total) by mouth See admin instructions. With onset of loose stool, take 89m followed by 28mevery 2 hours,  Maximum: 16 mg/day 120 capsule 1   loratadine (CLARITIN) 10 MG tablet Take 10 mg by mouth daily.     Multiple Vitamins-Minerals (ONE-A-DAY WOMENS 50 PLUS PO) Take 1 tablet by mouth daily.      potassium chloride SA (KLOR-CON M) 20 MEQ  tablet Take 2 tablets (40 mEq total) by mouth daily. (Patient taking differently: Take 40 mEq by mouth 2 (two) times daily.) 60 tablet 5   potassium chloride SA (KLOR-CON M) 20 MEQ tablet Take 1 tablet (20 mEq total) by mouth 2 (two) times daily. 20 tablet 0   simvastatin (ZOCOR) 40 MG tablet Take 1 tablet (40 mg total) by mouth at bedtime. 100 tablet 3   triamterene-hydrochlorothiazide (DYAZIDE) 37.5-25 MG capsule Take 1 each (1 capsule total) by mouth daily. 100 capsule 3   zinc gluconate 50 MG tablet Take 50 mg by mouth daily.     No current facility-administered medications for this visit.   Facility-Administered Medications Ordered in Other Visits  Medication Dose Route Frequency Provider Last Rate Last Admin   diphenhydrAMINE (BENADRYL) injection 25 mg  25 mg Intravenous Daily PRN Jacqueline Server, MD   25 mg at 06/26/22 1405     PHYSICAL EXAMINATION: ECOG PERFORMANCE STATUS: 1 - Symptomatic but completely ambulatory  Physical Exam Constitutional:      General: She is not in acute distress. HENT:     Head: Normocephalic and atraumatic.  Eyes:      General: No scleral icterus. Cardiovascular:     Rate and Rhythm: Normal rate and regular rhythm.     Heart sounds: Normal heart sounds.  Pulmonary:     Effort: Pulmonary effort is normal. No respiratory distress.     Breath sounds: No wheezing.  Abdominal:     General: Bowel sounds are normal. There is no distension.     Palpations: Abdomen is soft.     Comments: + Colostomy bag   Musculoskeletal:        General: No deformity. Normal range of motion.     Cervical back: Normal range of motion and neck supple.  Skin:    General: Skin is warm and dry.     Findings: No erythema or rash.  Neurological:     Mental Status: She is alert and oriented to person, place, and time. Mental status is at baseline.     Cranial Nerves: No cranial nerve deficit.     Coordination: Coordination normal.       LABORATORY DATA:  I have reviewed the data as listed    Latest Ref Rng & Units 08/14/2022    8:44 AM 07/24/2022    8:40 AM 07/10/2022    8:49 AM  CBC  WBC 4.0 - 10.5 K/uL 6.1  4.6  6.7   Hemoglobin 12.0 - 15.0 g/dL 11.8  11.7  12.1   Hematocrit 36.0 - 46.0 % 38.0  36.3  38.2   Platelets 150 - 400 K/uL 215  176  205       Latest Ref Rng & Units 07/24/2022    8:40 AM 07/10/2022    8:49 AM 06/26/2022    8:36 AM  CMP  Glucose 70 - 99 mg/dL 94  95  97   BUN 8 - 23 mg/dL _0 Creatinine 0.44 - 1.00 mg/dL 0.94  0.93  0.83   Sodium 135 - 145 mmol/L 137  137  139   Potassium 3.5 - 5.1 mmol/L 4.3  4.1  4.2   Chloride 98 - 111 mmol/L 105  104  106   CO2 22 - 32 mmol/L _1 Calcium 8.9 - 10.3 mg/dL 9.0  9.2  9.3   Total Protein 6.5 - 8.1 g/dL 6.8  6.8  6.7  Total Bilirubin 0.3 - 1.2 mg/dL 0.5  0.3  0.4   Alkaline Phos 38 - 126 U/L 59  74  73   AST 15 - 41 U/L 36  27  28   ALT 0 - 44 U/L _0 RADIOGRAPHIC STUDIES: I have personally reviewed the radiological images as listed and agreed with the findings in the report. CT CHEST ABDOMEN PELVIS W CONTRAST  Result  Date: 07/18/2022 CLINICAL DATA:  Metastatic rectal adenocarcinoma. Ongoing chemotherapy. * Tracking Code: BO * EXAM: CT CHEST, ABDOMEN, AND PELVIS WITH CONTRAST TECHNIQUE: Multidetector CT imaging of the chest, abdomen and pelvis was performed following the standard protocol during bolus administration of intravenous contrast. RADIATION DOSE REDUCTION: This exam was performed according to the departmental dose-optimization program which includes automated exposure control, adjustment of the mA and/or kV according to patient size and/or use of iterative reconstruction technique. CONTRAST:  27m OMNIPAQUE IOHEXOL 300 MG/ML  SOLN COMPARISON:  04/26/2022 FINDINGS: CT CHEST FINDINGS Cardiovascular: Coronary, aortic arch, and branch vessel atherosclerotic vascular disease. Right Port-A-Cath tip: Cavoatrial junction. Mediastinum/Nodes: Unremarkable Lungs/Pleura: Generally stable small scattered bilateral pulmonary nodules. One of the largest is a bilobed 8 by 6 mm right middle lobe nodule on image 106 series 3. No new nodules identified. Musculoskeletal: Degenerative glenohumeral arthropathy noted. Whole-body bone scan rib revealed accentuated activity in the right humeral head, on today's CT I do not see a specific cause for this accentuated activity, although MRI might be more sensitive. Thoracic spondylosis noted. CT ABDOMEN PELVIS FINDINGS Hepatobiliary: Gallbladder absent. Stable mild intrahepatic biliary dilatation, possibly a physiologic response to cholecystectomy. No significant focal liver lesion. Pancreas: Unremarkable Spleen: Unremarkable Adrenals/Urinary Tract: Stable appearance of bilateral benign-appearing renal cysts which do not warrant individual follow up. Adrenal glands unremarkable. Stomach/Bowel: Descending colostomy. There some small diverticula of the descending colon without findings of active diverticulitis. Pelvic floor laxity with loops of small bowel extending 3 cm below the pubococcygeal line.  Vascular/Lymphatic: Atherosclerosis is present, including aortoiliac atherosclerotic disease. Atherosclerotic plaque at the origin of the celiac trunk and proximally in the SMA, causing stenosis in the proximal SMA no compelling findings of occlusion of the superior mesenteric artery. No pathologic adenopathy is identified. Reproductive: Uterus absent.  Adnexa unremarkable. Other: No supplemental non-categorized findings. Musculoskeletal: Pelvic floor laxity. Subtle chronic posterior cortical irregularity and heterogeneity in the left iliac bone on image 82 of series 2, not appreciable on the bone scan of 07/14/2022, this has been present over the past year and could reflect radiation therapy related findings although strictly speaking, malignant involvement of the left iliac bone is difficult to exclude. Lumbar spondylosis and degenerative disc disease with mild resulting left foraminal stenosis at L4-5. IMPRESSION: 1. Stable scattered small bilateral pulmonary nodules, largest 8 by 6 mm in the right middle lobe. 2. Subtle chronically stable posterior cortical irregularity and heterogeneity in the left iliac bone, not appreciable on the bone scan of 07/14/2022. This has been present at least over the past year and could reflect radiation therapy related findings although strictly speaking, malignant involvement of the left iliac bone is difficult to exclude. If clinically warranted this could be further assessed with nuclear medicine PET-CT or MRI of the bony pelvis with and without contrast. 3. Pelvic floor laxity with loops of small bowel extending 3 cm below the pubococcygeal line. 4. Aortic and systemic atherosclerosis as detailed above. Aortic Atherosclerosis (ICD10-I70.0). Electronically Signed   By: WCindra EvesD.  On: 07/18/2022 13:32   NM Bone Scan Whole Body  Result Date: 07/14/2022 CLINICAL DATA:  Metastatic carcinoma. Hip pain. Metastatic adenocarcinoma. Rectal carcinoma EXAM: NUCLEAR  MEDICINE WHOLE BODY BONE SCAN TECHNIQUE: Whole body anterior and posterior images were obtained approximately 3 hours after intravenous injection of radiopharmaceutical. RADIOPHARMACEUTICALS:  21.9 mCi Technetium-71mMDP IV COMPARISON:  Hip radiograph 06/28/2022, CT chest abdomen pelvis 03/27/2022 FINDINGS: No abnormal radiotracer accumulation within the LEFT or RIGHT hip. No abnormal accumulation within the pelvis or spine. Intense uptake within the RIGHT humeral head mild uptake in the LEFT humeral head. No lesion identified in the RIGHT humeral head on CT 04/26/2022 (partially imaged). Additionally there was mild radiotracer activity associated with the glenohumeral joint on the RIGHT on FDG PET-CT scan 07/20/2020. This combination of findings favors degenerative uptake in the RIGHT humeral head. IMPRESSION: 1. No evidence of skeletal metastatic disease in the hips. 2. Asymmetric increased activity in the RIGHT humeral head is favored degenerative as described above. If pain in the RIGHT shoulder consider further evaluation with plain film or MRI. Electronically Signed   By: SSuzy BouchardM.D.   On: 07/14/2022 12:59   DG HIP UNILAT W OR W/O PELVIS 2-3 VIEWS LEFT  Result Date: 06/30/2022 CLINICAL DATA:  Left hip pain. EXAM: DG HIP (WITH OR WITHOUT PELVIS) 2-3V LEFT COMPARISON:  None Available. FINDINGS: Both hips are normally located. Mild degenerative changes. No acute bony findings and no plain film evidence of AVN. The pubic symphysis and SI joints are intact. No pelvic fractures or bone lesions. IMPRESSION: Mild degenerative changes but no acute bony findings. Electronically Signed   By: PMarijo SanesM.D.   On: 06/30/2022 09:07      CT CHEST ABDOMEN PELVIS W CONTRAST  Result Date: 07/18/2022 CLINICAL DATA:  Metastatic rectal adenocarcinoma. Ongoing chemotherapy. * Tracking Code: BO * EXAM: CT CHEST, ABDOMEN, AND PELVIS WITH CONTRAST TECHNIQUE: Multidetector CT imaging of the chest, abdomen and  pelvis was performed following the standard protocol during bolus administration of intravenous contrast. RADIATION DOSE REDUCTION: This exam was performed according to the departmental dose-optimization program which includes automated exposure control, adjustment of the mA and/or kV according to patient size and/or use of iterative reconstruction technique. CONTRAST:  729mOMNIPAQUE IOHEXOL 300 MG/ML  SOLN COMPARISON:  04/26/2022 FINDINGS: CT CHEST FINDINGS Cardiovascular: Coronary, aortic arch, and branch vessel atherosclerotic vascular disease. Right Port-A-Cath tip: Cavoatrial junction. Mediastinum/Nodes: Unremarkable Lungs/Pleura: Generally stable small scattered bilateral pulmonary nodules. One of the largest is a bilobed 8 by 6 mm right middle lobe nodule on image 106 series 3. No new nodules identified. Musculoskeletal: Degenerative glenohumeral arthropathy noted. Whole-body bone scan rib revealed accentuated activity in the right humeral head, on today's CT I do not see a specific cause for this accentuated activity, although MRI might be more sensitive. Thoracic spondylosis noted. CT ABDOMEN PELVIS FINDINGS Hepatobiliary: Gallbladder absent. Stable mild intrahepatic biliary dilatation, possibly a physiologic response to cholecystectomy. No significant focal liver lesion. Pancreas: Unremarkable Spleen: Unremarkable Adrenals/Urinary Tract: Stable appearance of bilateral benign-appearing renal cysts which do not warrant individual follow up. Adrenal glands unremarkable. Stomach/Bowel: Descending colostomy. There some small diverticula of the descending colon without findings of active diverticulitis. Pelvic floor laxity with loops of small bowel extending 3 cm below the pubococcygeal line. Vascular/Lymphatic: Atherosclerosis is present, including aortoiliac atherosclerotic disease. Atherosclerotic plaque at the origin of the celiac trunk and proximally in the SMA, causing stenosis in the proximal SMA no  compelling findings of occlusion of  the superior mesenteric artery. No pathologic adenopathy is identified. Reproductive: Uterus absent.  Adnexa unremarkable. Other: No supplemental non-categorized findings. Musculoskeletal: Pelvic floor laxity. Subtle chronic posterior cortical irregularity and heterogeneity in the left iliac bone on image 82 of series 2, not appreciable on the bone scan of 07/14/2022, this has been present over the past year and could reflect radiation therapy related findings although strictly speaking, malignant involvement of the left iliac bone is difficult to exclude. Lumbar spondylosis and degenerative disc disease with mild resulting left foraminal stenosis at L4-5. IMPRESSION: 1. Stable scattered small bilateral pulmonary nodules, largest 8 by 6 mm in the right middle lobe. 2. Subtle chronically stable posterior cortical irregularity and heterogeneity in the left iliac bone, not appreciable on the bone scan of 07/14/2022. This has been present at least over the past year and could reflect radiation therapy related findings although strictly speaking, malignant involvement of the left iliac bone is difficult to exclude. If clinically warranted this could be further assessed with nuclear medicine PET-CT or MRI of the bony pelvis with and without contrast. 3. Pelvic floor laxity with loops of small bowel extending 3 cm below the pubococcygeal line. 4. Aortic and systemic atherosclerosis as detailed above. Aortic Atherosclerosis (ICD10-I70.0). Electronically Signed   By: Van Clines M.D.   On: 07/18/2022 13:32   NM Bone Scan Whole Body  Result Date: 07/14/2022 CLINICAL DATA:  Metastatic carcinoma. Hip pain. Metastatic adenocarcinoma. Rectal carcinoma EXAM: NUCLEAR MEDICINE WHOLE BODY BONE SCAN TECHNIQUE: Whole body anterior and posterior images were obtained approximately 3 hours after intravenous injection of radiopharmaceutical. RADIOPHARMACEUTICALS:  21.9 mCi Technetium-4mMDP  IV COMPARISON:  Hip radiograph 06/28/2022, CT chest abdomen pelvis 03/27/2022 FINDINGS: No abnormal radiotracer accumulation within the LEFT or RIGHT hip. No abnormal accumulation within the pelvis or spine. Intense uptake within the RIGHT humeral head mild uptake in the LEFT humeral head. No lesion identified in the RIGHT humeral head on CT 04/26/2022 (partially imaged). Additionally there was mild radiotracer activity associated with the glenohumeral joint on the RIGHT on FDG PET-CT scan 07/20/2020. This combination of findings favors degenerative uptake in the RIGHT humeral head. IMPRESSION: 1. No evidence of skeletal metastatic disease in the hips. 2. Asymmetric increased activity in the RIGHT humeral head is favored degenerative as described above. If pain in the RIGHT shoulder consider further evaluation with plain film or MRI. Electronically Signed   By: SSuzy BouchardM.D.   On: 07/14/2022 12:59   DG HIP UNILAT W OR W/O PELVIS 2-3 VIEWS LEFT  Result Date: 06/30/2022 CLINICAL DATA:  Left hip pain. EXAM: DG HIP (WITH OR WITHOUT PELVIS) 2-3V LEFT COMPARISON:  None Available. FINDINGS: Both hips are normally located. Mild degenerative changes. No acute bony findings and no plain film evidence of AVN. The pubic symphysis and SI joints are intact. No pelvic fractures or bone lesions. IMPRESSION: Mild degenerative changes but no acute bony findings. Electronically Signed   By: PMarijo SanesM.D.   On: 06/30/2022 09:07

## 2022-08-14 NOTE — Assessment & Plan Note (Signed)
potassium is stable.  Continue potassium chloride 40 mEq  daily.

## 2022-08-14 NOTE — Assessment & Plan Note (Signed)
Chemotherapy plan as listed above 

## 2022-08-14 NOTE — Assessment & Plan Note (Signed)
Continue  Eliquis 2.5 mg twice daily for anticoagulation prophylaxis. 

## 2022-08-14 NOTE — Assessment & Plan Note (Signed)
Hb is stable. observation 

## 2022-08-14 NOTE — Assessment & Plan Note (Signed)
History of stage IIIC Rectal cancer, s/p TNT, followed by 09/17/19 APR/posterior vaginectomy/TAH/BSO/VY-flap, pT4b pN0 with close vaginal margin 0.2 mm.  Uterus and ovaries negative for malignancy. palliative radiation to vaginal recurrence- 01/19/21 recurrence with lung metastasis.-Palliative -FOLFIRI plus bevacizumab.  Irinotecan was dropped in November 2022 due to side effects. Negative for UGT1A1*28 - radiographically stable, rise of CEA-July 2023 CT showed slightly progression of lung metastasis. Labs are reviewed and discussed with patient. CEA progressively increases Proceed with dose reduced FOLFIRI-[Irinotecan 100 mg/m2] / bevacizumab- CT- bone scan- stable disease  RAS wild type. Consider switch to irinotecan +panitumumab upon significant radiographic progression .

## 2022-08-14 NOTE — Assessment & Plan Note (Signed)
patient will receive long-acting GCSF   Ziextenzo prophylaxis on D3

## 2022-08-16 ENCOUNTER — Inpatient Hospital Stay: Payer: Medicare Other | Attending: Oncology

## 2022-08-16 VITALS — BP 140/81 | HR 82

## 2022-08-16 DIAGNOSIS — R634 Abnormal weight loss: Secondary | ICD-10-CM | POA: Diagnosis not present

## 2022-08-16 DIAGNOSIS — Z800 Family history of malignant neoplasm of digestive organs: Secondary | ICD-10-CM | POA: Insufficient documentation

## 2022-08-16 DIAGNOSIS — D701 Agranulocytosis secondary to cancer chemotherapy: Secondary | ICD-10-CM | POA: Diagnosis not present

## 2022-08-16 DIAGNOSIS — C799 Secondary malignant neoplasm of unspecified site: Secondary | ICD-10-CM

## 2022-08-16 DIAGNOSIS — E7800 Pure hypercholesterolemia, unspecified: Secondary | ICD-10-CM | POA: Diagnosis not present

## 2022-08-16 DIAGNOSIS — M25552 Pain in left hip: Secondary | ICD-10-CM | POA: Diagnosis not present

## 2022-08-16 DIAGNOSIS — Z86718 Personal history of other venous thrombosis and embolism: Secondary | ICD-10-CM | POA: Insufficient documentation

## 2022-08-16 DIAGNOSIS — T451X5A Adverse effect of antineoplastic and immunosuppressive drugs, initial encounter: Secondary | ICD-10-CM | POA: Diagnosis not present

## 2022-08-16 DIAGNOSIS — D6481 Anemia due to antineoplastic chemotherapy: Secondary | ICD-10-CM | POA: Diagnosis not present

## 2022-08-16 DIAGNOSIS — I10 Essential (primary) hypertension: Secondary | ICD-10-CM | POA: Insufficient documentation

## 2022-08-16 DIAGNOSIS — M47814 Spondylosis without myelopathy or radiculopathy, thoracic region: Secondary | ICD-10-CM | POA: Insufficient documentation

## 2022-08-16 DIAGNOSIS — Z86711 Personal history of pulmonary embolism: Secondary | ICD-10-CM | POA: Insufficient documentation

## 2022-08-16 DIAGNOSIS — Z5189 Encounter for other specified aftercare: Secondary | ICD-10-CM | POA: Diagnosis not present

## 2022-08-16 DIAGNOSIS — C20 Malignant neoplasm of rectum: Secondary | ICD-10-CM | POA: Insufficient documentation

## 2022-08-16 DIAGNOSIS — C7800 Secondary malignant neoplasm of unspecified lung: Secondary | ICD-10-CM | POA: Insufficient documentation

## 2022-08-16 DIAGNOSIS — K219 Gastro-esophageal reflux disease without esophagitis: Secondary | ICD-10-CM | POA: Insufficient documentation

## 2022-08-16 DIAGNOSIS — Z5111 Encounter for antineoplastic chemotherapy: Secondary | ICD-10-CM | POA: Insufficient documentation

## 2022-08-16 DIAGNOSIS — Z79899 Other long term (current) drug therapy: Secondary | ICD-10-CM | POA: Diagnosis not present

## 2022-08-16 DIAGNOSIS — Z7901 Long term (current) use of anticoagulants: Secondary | ICD-10-CM | POA: Diagnosis not present

## 2022-08-16 LAB — CEA: CEA: 2046 ng/mL — ABNORMAL HIGH (ref 0.0–4.7)

## 2022-08-16 MED ORDER — PEGFILGRASTIM INJECTION 6 MG/0.6ML ~~LOC~~
6.0000 mg | PREFILLED_SYRINGE | Freq: Once | SUBCUTANEOUS | Status: AC
  Administered 2022-08-16: 6 mg via SUBCUTANEOUS
  Filled 2022-08-16: qty 0.6

## 2022-08-16 MED ORDER — HEPARIN SOD (PORK) LOCK FLUSH 100 UNIT/ML IV SOLN
500.0000 [IU] | Freq: Once | INTRAVENOUS | Status: AC | PRN
  Administered 2022-08-16: 500 [IU]
  Filled 2022-08-16: qty 5

## 2022-08-16 MED ORDER — SODIUM CHLORIDE 0.9% FLUSH
10.0000 mL | INTRAVENOUS | Status: DC | PRN
  Administered 2022-08-16: 10 mL
  Filled 2022-08-16: qty 10

## 2022-08-21 ENCOUNTER — Ambulatory Visit: Payer: Medicare Other

## 2022-08-21 ENCOUNTER — Ambulatory Visit: Payer: Medicare Other | Admitting: Oncology

## 2022-08-21 ENCOUNTER — Other Ambulatory Visit: Payer: Medicare Other

## 2022-08-24 ENCOUNTER — Other Ambulatory Visit: Payer: Self-pay | Admitting: Oncology

## 2022-08-24 DIAGNOSIS — E876 Hypokalemia: Secondary | ICD-10-CM

## 2022-08-25 MED FILL — Dexamethasone Sodium Phosphate Inj 100 MG/10ML: INTRAMUSCULAR | Qty: 1 | Status: AC

## 2022-08-28 ENCOUNTER — Encounter: Payer: Self-pay | Admitting: Oncology

## 2022-08-28 ENCOUNTER — Inpatient Hospital Stay: Payer: Medicare Other

## 2022-08-28 ENCOUNTER — Inpatient Hospital Stay (HOSPITAL_BASED_OUTPATIENT_CLINIC_OR_DEPARTMENT_OTHER): Payer: Medicare Other | Admitting: Oncology

## 2022-08-28 VITALS — BP 145/61 | HR 70 | Temp 98.9°F | Wt 157.8 lb

## 2022-08-28 DIAGNOSIS — C799 Secondary malignant neoplasm of unspecified site: Secondary | ICD-10-CM

## 2022-08-28 DIAGNOSIS — C20 Malignant neoplasm of rectum: Secondary | ICD-10-CM | POA: Diagnosis not present

## 2022-08-28 DIAGNOSIS — C7800 Secondary malignant neoplasm of unspecified lung: Secondary | ICD-10-CM | POA: Diagnosis not present

## 2022-08-28 DIAGNOSIS — M25552 Pain in left hip: Secondary | ICD-10-CM | POA: Diagnosis not present

## 2022-08-28 DIAGNOSIS — Z5111 Encounter for antineoplastic chemotherapy: Secondary | ICD-10-CM

## 2022-08-28 DIAGNOSIS — Z86711 Personal history of pulmonary embolism: Secondary | ICD-10-CM

## 2022-08-28 DIAGNOSIS — D6481 Anemia due to antineoplastic chemotherapy: Secondary | ICD-10-CM | POA: Diagnosis not present

## 2022-08-28 DIAGNOSIS — Z86718 Personal history of other venous thrombosis and embolism: Secondary | ICD-10-CM | POA: Diagnosis not present

## 2022-08-28 DIAGNOSIS — Z800 Family history of malignant neoplasm of digestive organs: Secondary | ICD-10-CM | POA: Diagnosis not present

## 2022-08-28 DIAGNOSIS — Z7901 Long term (current) use of anticoagulants: Secondary | ICD-10-CM | POA: Diagnosis not present

## 2022-08-28 DIAGNOSIS — T451X5A Adverse effect of antineoplastic and immunosuppressive drugs, initial encounter: Secondary | ICD-10-CM

## 2022-08-28 DIAGNOSIS — Z79899 Other long term (current) drug therapy: Secondary | ICD-10-CM | POA: Diagnosis not present

## 2022-08-28 DIAGNOSIS — K219 Gastro-esophageal reflux disease without esophagitis: Secondary | ICD-10-CM | POA: Diagnosis not present

## 2022-08-28 DIAGNOSIS — M47814 Spondylosis without myelopathy or radiculopathy, thoracic region: Secondary | ICD-10-CM | POA: Diagnosis not present

## 2022-08-28 DIAGNOSIS — I10 Essential (primary) hypertension: Secondary | ICD-10-CM | POA: Diagnosis not present

## 2022-08-28 DIAGNOSIS — R634 Abnormal weight loss: Secondary | ICD-10-CM | POA: Diagnosis not present

## 2022-08-28 DIAGNOSIS — D701 Agranulocytosis secondary to cancer chemotherapy: Secondary | ICD-10-CM | POA: Diagnosis not present

## 2022-08-28 DIAGNOSIS — Z5189 Encounter for other specified aftercare: Secondary | ICD-10-CM | POA: Diagnosis not present

## 2022-08-28 DIAGNOSIS — E7800 Pure hypercholesterolemia, unspecified: Secondary | ICD-10-CM | POA: Diagnosis not present

## 2022-08-28 LAB — COMPREHENSIVE METABOLIC PANEL
ALT: 14 U/L (ref 0–44)
AST: 28 U/L (ref 15–41)
Albumin: 3.8 g/dL (ref 3.5–5.0)
Alkaline Phosphatase: 68 U/L (ref 38–126)
Anion gap: 7 (ref 5–15)
BUN: 11 mg/dL (ref 8–23)
CO2: 27 mmol/L (ref 22–32)
Calcium: 9.2 mg/dL (ref 8.9–10.3)
Chloride: 104 mmol/L (ref 98–111)
Creatinine, Ser: 0.93 mg/dL (ref 0.44–1.00)
GFR, Estimated: >60 mL/min (ref >60)
Glucose, Bld: 100 mg/dL — ABNORMAL HIGH (ref 70–99)
Potassium: 4.1 mmol/L (ref 3.5–5.1)
Sodium: 138 mmol/L (ref 135–145)
Total Bilirubin: 0.4 mg/dL (ref 0.3–1.2)
Total Protein: 6.7 g/dL (ref 6.5–8.1)

## 2022-08-28 LAB — CBC WITH DIFFERENTIAL/PLATELET
Abs Immature Granulocytes: 0.04 10*3/uL (ref 0.00–0.07)
Basophils Absolute: 0 10*3/uL (ref 0.0–0.1)
Basophils Relative: 1 %
Eosinophils Absolute: 0.2 10*3/uL (ref 0.0–0.5)
Eosinophils Relative: 3 %
HCT: 37.4 % (ref 36.0–46.0)
Hemoglobin: 11.6 g/dL — ABNORMAL LOW (ref 12.0–15.0)
Immature Granulocytes: 1 %
Lymphocytes Relative: 25 %
Lymphs Abs: 1.5 10*3/uL (ref 0.7–4.0)
MCH: 30 pg (ref 26.0–34.0)
MCHC: 31 g/dL (ref 30.0–36.0)
MCV: 96.6 fL (ref 80.0–100.0)
Monocytes Absolute: 0.6 10*3/uL (ref 0.1–1.0)
Monocytes Relative: 11 %
Neutro Abs: 3.5 10*3/uL (ref 1.7–7.7)
Neutrophils Relative %: 59 %
Platelets: 229 10*3/uL (ref 150–400)
RBC: 3.87 MIL/uL (ref 3.87–5.11)
RDW: 18.4 % — ABNORMAL HIGH (ref 11.5–15.5)
WBC: 5.9 10*3/uL (ref 4.0–10.5)
nRBC: 0 % (ref 0.0–0.2)

## 2022-08-28 LAB — PROTEIN, URINE, RANDOM: Total Protein, Urine: 18 mg/dL

## 2022-08-28 MED ORDER — ACETAMINOPHEN 325 MG PO TABS
650.0000 mg | ORAL_TABLET | Freq: Four times a day (QID) | ORAL | Status: DC | PRN
  Administered 2022-08-28: 650 mg via ORAL
  Filled 2022-08-28: qty 2

## 2022-08-28 MED ORDER — SODIUM CHLORIDE 0.9 % IV SOLN
100.0000 mg/m2 | Freq: Once | INTRAVENOUS | Status: AC
  Administered 2022-08-28: 180 mg via INTRAVENOUS
  Filled 2022-08-28: qty 4

## 2022-08-28 MED ORDER — ATROPINE SULFATE 1 MG/ML IV SOLN
0.5000 mg | Freq: Once | INTRAVENOUS | Status: AC | PRN
  Administered 2022-08-28: 0.5 mg via INTRAVENOUS
  Filled 2022-08-28: qty 1

## 2022-08-28 MED ORDER — SODIUM CHLORIDE 0.9 % IV SOLN
10.0000 mg | Freq: Once | INTRAVENOUS | Status: AC
  Administered 2022-08-28: 10 mg via INTRAVENOUS
  Filled 2022-08-28: qty 10

## 2022-08-28 MED ORDER — SODIUM CHLORIDE 0.9% FLUSH
10.0000 mL | INTRAVENOUS | Status: DC | PRN
  Filled 2022-08-28: qty 10

## 2022-08-28 MED ORDER — SODIUM CHLORIDE 0.9 % IV SOLN
750.0000 mg | Freq: Once | INTRAVENOUS | Status: AC
  Administered 2022-08-28: 750 mg via INTRAVENOUS
  Filled 2022-08-28: qty 25

## 2022-08-28 MED ORDER — SODIUM CHLORIDE 0.9 % IV SOLN
2400.0000 mg/m2 | INTRAVENOUS | Status: DC
  Administered 2022-08-28: 4300 mg via INTRAVENOUS
  Filled 2022-08-28: qty 86

## 2022-08-28 MED ORDER — PALONOSETRON HCL INJECTION 0.25 MG/5ML
0.2500 mg | Freq: Once | INTRAVENOUS | Status: AC
  Administered 2022-08-28: 0.25 mg via INTRAVENOUS
  Filled 2022-08-28: qty 5

## 2022-08-28 MED ORDER — SODIUM CHLORIDE 0.9 % IV SOLN
Freq: Once | INTRAVENOUS | Status: AC
  Filled 2022-08-28: qty 250

## 2022-08-28 MED ORDER — HEPARIN SOD (PORK) LOCK FLUSH 100 UNIT/ML IV SOLN
500.0000 [IU] | Freq: Once | INTRAVENOUS | Status: DC | PRN
  Filled 2022-08-28: qty 5

## 2022-08-28 MED ORDER — DIPHENHYDRAMINE HCL 50 MG/ML IJ SOLN
25.0000 mg | Freq: Every day | INTRAMUSCULAR | Status: DC | PRN
  Administered 2022-08-28: 25 mg via INTRAVENOUS
  Filled 2022-08-28: qty 1

## 2022-08-28 MED ORDER — SODIUM CHLORIDE 0.9 % IV SOLN
5.0000 mg/kg | Freq: Once | INTRAVENOUS | Status: AC
  Administered 2022-08-28: 350 mg via INTRAVENOUS
  Filled 2022-08-28: qty 14

## 2022-08-28 NOTE — Assessment & Plan Note (Signed)
patient will receive long-acting GCSF   Ziextenzo prophylaxis on D3

## 2022-08-28 NOTE — Progress Notes (Signed)
Hematology/Oncology Progress note [phone] [phone]      Patient Care Team: Olin Hauser, DO as PCP - General (Family Medicine) Clent Jacks, RN as Registered Nurse Earlie Server, MD as Consulting Physician (Hematology and Oncology) Cammie Sickle, MD as Consulting Physician (Oncology)  ASSESSMENT & PLAN:   Cancer Staging  Rectal cancer [facility]) Staging form: Colon and Rectum, AJCC 8th Edition - Pathologic stage from 10/06/2019: Stage IIC (ypT4b, pN0, cM0) - Signed by Earlie Server, MD on 10/06/2019 - Clinical stage from 06/30/2020: [name] - Signed by Earlie Server, MD on 04/17/2022 - Pathologic: Stage Unknown (rpTX, pNX, cM1) - Signed by Earlie Server, MD on 01/31/2021   Rectal cancer [facility]) History of stage IIIC Rectal cancer, s/p TNT, followed by 09/17/19 APR/posterior vaginectomy/TAH/BSO/VY-flap, pT4b pN0 with close vaginal margin 0.2 mm.  Uterus and ovaries negative for malignancy. palliative radiation to vaginal recurrence- 01/19/21 recurrence with lung metastasis.-Palliative -FOLFIRI plus bevacizumab.  Irinotecan was dropped in November 2022 due to side effects. Negative for UGT1A1*28 - radiographically stable, rise of CEA-July 2023 CT showed slightly progression of lung metastasis. Labs are reviewed and discussed with patient. CEA progressively increases Proceed with dose reduced FOLFIRI-[Irinotecan 100 mg/m2] / bevacizumab- CT- bone scan- stable disease RAS wild type. Consider switch to irinotecan +panitumumab upon significant radiographic progression .  #posterior cortical irregularity and heterogeneity in the left iliac bone, can no r/o mets.  she has mild left hip pain. Secure message sent to Dr. Baruch Gouty to see if there is benefit of palliative RT. May need additional imaing work up.   Encounter for antineoplastic chemotherapy Chemotherapy plan as listed above.   Anemia due to antineoplastic chemotherapy Hb is stable.  observation  Chemotherapy induced neutropenia (HCC) patient will receive long-acting GCSF   Ziextenzo prophylaxis on D3  History of pulmonary embolism Continue  Eliquis 2.5 mg twice daily for anticoagulation prophylaxis.   Follow up in 2 weeks. Lab MD next cycle of chemo. All questions were answered. The patient knows to call the clinic with any problems, questions or concerns.  Earlie Server, MD, PhD [facility] Hematology Oncology 08/28/2022      CHIEF COMPLAINTS/REASON FOR VISIT:  Follow up for rectal cancer  HISTORY OF PRESENTING ILLNESS:  Patient initially presented with complaints of postmenopausal bleeding on 08/16/2018.  History of was menopausal vaginal bleeding in 2016 which resulted in cervical polypectomy.  Pathology 02/04/2015 showed cervical polyp, consistent with benign endometrial polyp.  [name] after polypectomy due to anxiety associated with pelvic exams.  pelvic exam on 08/16/2018 reviewed cervical abnormality and from enlarged uterus. Seen by Dr. Marcelline Mates on 10/29/2018.  Endometrial biopsy and a Pap smear was performed. 10/29/2018 Pap smear showed adenocarcinoma, favor endometrial origin. 10/29/2018 endometrial biopsy showed endometrioid carcinoma, FIGO grade 1.  10/29/2018- TA & TV Ultrasound revealed: Anteverted uterus measuring 8.7 x 5.6 x 6.4 cm without evidence of focal masses.  The endometrium measuring 24.1 mm (thickened) and heterogeneous.  Right and left ovaries not visualized.  No adnexal masses identified.  No free fluid in cul-de-sac.  Patient was seen by Dr. Theora Gianotti in clinic on 11/13/2018.  Cervical exam reveals 2 cm exophytic irregular mass consistent with malignancy.   11/19/2018 CT chest abdomen pelvis with contrast showed thickened endometrium with some irregularity compatible with the provided diagnosis of endometrial malignancy.  There is a mildly prominent left inguinal node 1.4 cm.  Patient was seen by Dr. Fransisca Connors on 11/20/2018 and left groin  lymph node biopsy  was recommended.  11/26/2018 patient underwent left inguinal lymph node biopsy. Pathology showed metastatic adenocarcinoma consistent with colorectal origin.  CDX 2+.  Case was discussed on tumor board.  Recommend colonoscopy for further evaluation.  Patient reports significant weight loss 30 pounds over the last year.  Chronic vaginal spotting. Change of bowel habits the past few months.  More constipated.  Family history positive for brother who has colon cancer prostate cancer.  patient has underwent colonoscopy on 12/03/2018 which reviewed a nonobstructing large mass in the rectum.  Also chronic fistula.  Mass was not circumferential.  This was biopsied with a cold forceps for histology.  Pathology came back hyperplastic polyp negative for dysplasia and malignancy. [name], patient underwent flex sigmoidoscopy on 12/06/2018 with rebiopsy of the rectal mass. This time biopsy results came back positive for invasive colorectal adenocarcinoma, moderately differentiated. Immunotherapy for nearly mismatch repair protein (MMR ) was performed.  There is no loss of MMR expression.  low probability of MSI high.   # Seen by [name] for evaluation of resectability for rectal cancer. In addition, she also had a second opinion with [facility] pathology where her endometrial biopsy pathology was changed to  adenocarcinoma, consistent with colorectal primary.   [name]. She has home health that has been assisting with ostomy care  Patient was also evaluated by [facility] oncology.  Recommendation is to proceed with TNT with concurrent chemoradiation followed by neoadjuvant chemotherapy followed by surgical resection. Patient prefers to have treatment done locally with [facility].   # Oncology Treatment:  02/03/2019- 03/19/2019  concurrent Xeloda and radiation.  Xeloda dose 813m /m2 BID - rounded to 16553mBID- on days of  radiation. 04/09/2019, started on FOLFOX with bolus early.  Omitted.  07/16/2019 finished 8 cycles of FOLFOX. 09/17/19 APR/posterior vaginectomy/TAH/BSO/VY-flap pT4b pN0 with close vaginal margin 0.2 mm.  Uterus and ovaries negative for malignancy. Patient reports bilateral lower extremity numbness and tingling, intermittent, left worse than right. She has lost a lot of weight since her APR surgery.   #Family history with half brother having's history of colon cancer prostate cancer.  Personal history of colorectal cancer.  Patient has not decided if she wants genetic testing.    # history of PE( 01/13/2020)  in the bilateral lower extremity DVT (01/13/2020).   She finishes 6 months of anticoagulation with Eliquis 5 mg twice daily. Now switched to Eliquis 2.5 mg twice daily..  # She has now developed recurrent disease. #06/30/20  vaginal introitus mass biopsied. Pathology is consistent with metastatic colorectal adenocarcinoma I have discussed with [name]  Dr. MaHester Matesnd the mass is not resectable. Patient has also had colonoscopy by Dr. AnVicente Malesesterday. Normal examination. # 07/16/2020 cycle 1 FOLFIRI  # 07/20/2020 PET scan was done for further evaluation, images are consistent with local recurrence, no distant metastasis. #Discussed with radiation oncology Dr. ChBaruch Goutyill recommends concurrent chemotherapy and radiation. 08/02/2020-08/16/2020, patient starts radiation.  Xeloda was held due to neutropenia 08/17/2020,-09/06/2020 Xeloda 1500 mg twice daily concurrently with radiation  01/31/21 started on FOLFIRI + Bev 05/18/2021 CT chest abdomen pelvis showed Previously noted enlargement of bilateral inguinal lymph nodes is resolved, consistent with treatment response of nodal metastatic disease. Interval decrease in size of multiple small bilateral pulmonary nodules, consistent with treatment response of pulmonary metastatic disease. No evidence of new metastatic disease. 05/24/2021 - 08/30/2021,  continued on FOLFIRI plus bevacizumab.  Irinotecan dose was reduced, eventually 1009m2  09/02/2021, CT  chest abdomen pelvis without contrast Showed small bilateral pulmonary nodules, unchanged.  Stable metastatic disease.  No noncontrast evidence of new metastatic disease in the chest abdomen pelvis.  Small parastomal hernia.  Enlargement of main pulmonary artery.  Coronary artery disease.  09/13/2021, maintenance 5-FU/bevacizumab 11/28/2021, 5-FU/Irinotecan/bevacizumab.  Irinotecan 100 mg/m2 was added back due to progressively increasing CEA.  INTERVAL HISTORY TYKIA MELLONE is a 76 y.o. female who has above history reviewed by me presents for follow-up of rectal cancer. Currently on dose reduced FOLFIRI/bevacizumab   + left hip arthritis pain. She takes Tylenol PRN   Review of Systems  Constitutional:  Positive for fatigue. Negative for appetite change, chills, fever and unexpected weight change.  HENT:   Negative for hearing loss and voice change.   Eyes:  Negative for eye problems.  Respiratory:  Negative for chest tightness and cough.   Cardiovascular:  Negative for chest pain.  Gastrointestinal:  Negative for abdominal distention, abdominal pain, blood in stool, constipation, diarrhea and nausea.  Endocrine: Negative for hot flashes.  Genitourinary:  Negative for difficulty urinating and frequency.   Musculoskeletal:  Positive for arthralgias.  Skin:  Negative for itching and rash.  Neurological:  Negative for extremity weakness and numbness.  Hematological:  Negative for adenopathy.  Psychiatric/Behavioral:  Negative for confusion.     MEDICAL HISTORY:  Past Medical History:  Diagnosis Date   Allergy    Arthritis    Blood clot in vein    Family history of colon cancer    GERD (gastroesophageal reflux disease)    Hypercholesteremia    Hypertension    Hypertension    Lower extremity edema    Personal history of chemotherapy    Rectal cancer ([place]) 12/2018   Urinary  incontinence     SURGICAL HISTORY: Past Surgical History:  Procedure Laterality Date   ABDOMINAL HYSTERECTOMY     CHOLECYSTECTOMY  1971   COLONOSCOPY WITH PROPOFOL N/A 12/03/2018   Procedure: COLONOSCOPY WITH PROPOFOL;  Surgeon: Lucilla Lame, MD;  Location: [facility] ENDOSCOPY;  Service: Endoscopy;  Laterality: N/A;   COLONOSCOPY WITH PROPOFOL N/A 07/15/2020   Procedure: COLONOSCOPY WITH PROPOFOL;  Surgeon: [name], MD;  Location: [facility] ENDOSCOPY;  Service: Gastroenterology;  Laterality: N/A;   FLEXIBLE SIGMOIDOSCOPY N/A 12/06/2018   Procedure: FLEXIBLE SIGMOIDOSCOPY;  Surgeon: [name], MD;  Location: [facility] ENDOSCOPY;  Service: Endoscopy;  Laterality: N/A;   LAPAROSCOPIC COLOSTOMY  01/06/2019   PORTACATH PLACEMENT N/A 04/03/2019   Procedure: INSERTION PORT-A-CATH;  Surgeon: Jules Husbands, MD;  Location: [facility] ORS;  Service: General;  Laterality: N/A;    SOCIAL HISTORY: Social History   Socioeconomic History   Marital status: Widowed    Spouse name: Not on file   Number of children: Not on file   Years of education: Not on file   Highest education level: Not on file  Occupational History   Not on file  Tobacco Use   Smoking status: Former    Types: Cigarettes    Quit date: 12/02/1977    Years since quitting: 44.7   Smokeless tobacco: Former  Scientific laboratory technician Use: Never used  Substance and Sexual Activity   Alcohol use: Never   Drug use: Never   Sexual activity: Not Currently    Birth control/protection: None  Other Topics Concern   Not on file  Social History Narrative   Lives with daughter   Social Determinants of Health   Financial Resource Strain: Medium Risk (06/30/2022)   Overall  Financial Resource Strain (CARDIA)    Difficulty of Paying Living Expenses: Somewhat hard  Food Insecurity: No Food Insecurity (06/30/2022)   Hunger Vital Sign    Worried About Running Out of Food in the Last Year: Never true    Ran Out of Food in the Last Year: Never true   Transportation Needs: No Transportation Needs (06/30/2022)   PRAPARE - Hydrologist (Medical): No    Lack of Transportation (Non-Medical): No  Physical Activity: Inactive (06/30/2022)   Exercise Vital Sign    Days of Exercise per Week: 0 days    Minutes of Exercise per Session: 0 min  Stress: No Stress Concern Present (06/30/2022)   [place]    Feeling of Stress : Not at all  Social Connections: Moderately Isolated (06/30/2022)   Social Connection and Isolation Panel [NHANES]    Frequency of Communication with Friends and Family: More than three times a week    Frequency of Social Gatherings with Friends and Family: Twice a week    Attends Religious Services: More than 4 times per year    Active Member of [employer] or Organizations: No    Attends Archivist Meetings: Never    Marital Status: Widowed  Intimate Partner Violence: Not At Risk (06/30/2022)   Humiliation, Afraid, Rape, and Kick questionnaire    Fear of Current or Ex-Partner: No    Emotionally Abused: No    Physically Abused: No    Sexually Abused: No    FAMILY HISTORY: Family History  Problem Relation Age of Onset   Colon cancer Brother 62       exposure to chemicals [place]   Hypertension Mother    Stroke Mother    Kidney failure Father    Breast cancer Neg Hx    Ovarian cancer Neg Hx     ALLERGIES:  is allergic to sulfamethoxazole-trimethoprim.  MEDICATIONS:  Current Outpatient Medications  Medication Sig Dispense Refill   benzonatate (TESSALON) 200 MG capsule Take 1 capsule (200 mg total) by mouth 3 (three) times daily as needed for cough. 30 capsule 0   Cholecalciferol (VITAMIN D3) 2000 units capsule Take 2,000 Units by mouth daily.     cholestyramine (QUESTRAN) 4 g packet Take 1 packet (4 g total) by mouth 3 (three) times daily. 90 each 1   diclofenac sodium (VOLTAREN) 1 % GEL Apply 2 g topically 4 (four)  times daily as needed (joint pain).  11   diphenoxylate-atropine (LOMOTIL) 2.5-0.025 MG tablet Take 1 tablet by mouth 4 (four) times daily as needed for diarrhea or loose stools. 30 tablet 1   ELIQUIS 2.5 MG TABS tablet TAKE 1 TABLET BY MOUTH TWICE  DAILY 60 tablet 11   fluticasone (FLONASE) 50 MCG/ACT nasal spray USE 1 SPRAY IN EACH NOSTRIL ONCE DAILY 16 g 3   gabapentin (NEURONTIN) 100 MG capsule Take 1 capsule (100 mg total) by mouth at bedtime. 30 capsule 0   lidocaine-prilocaine (EMLA) cream Apply 1 application. topically as needed. 30 g 6   loperamide (IMODIUM) 2 MG capsule Take 1 capsule (2 mg total) by mouth See admin instructions. With onset of loose stool, take 37m followed by 263mevery 2 hours,  Maximum: 16 mg/day 120 capsule 1   loratadine (CLARITIN) 10 MG tablet Take 10 mg by mouth daily.     Multiple Vitamins-Minerals (ONE-A-DAY WOMENS 50 PLUS PO) Take 1 tablet by mouth daily.  potassium chloride SA (KLOR-CON M) 20 MEQ tablet Take 2 tablets (40 mEq total) by mouth daily. (Patient taking differently: Take 40 mEq by mouth 2 (two) times daily.) 60 tablet 5   simvastatin (ZOCOR) 40 MG tablet Take 1 tablet (40 mg total) by mouth at bedtime. 100 tablet 3   triamterene-hydrochlorothiazide (DYAZIDE) 37.5-25 MG capsule Take 1 each (1 capsule total) by mouth daily. 100 capsule 3   zinc gluconate 50 MG tablet Take 50 mg by mouth daily.     No current facility-administered medications for this visit.   Facility-Administered Medications Ordered in Other Visits  Medication Dose Route Frequency Provider Last Rate Last Admin   acetaminophen (TYLENOL) tablet 650 mg  650 mg Oral Q6H PRN Earlie Server, MD   650 mg at 08/28/22 0943   atropine injection 0.5 mg  0.5 mg Intravenous Once PRN Earlie Server, MD       bevacizumab-awwb (MVASI) 350 mg in sodium chloride 0.9 % 100 mL chemo infusion  5 mg/kg (Treatment Plan Recorded) Intravenous Once Earlie Server, MD       diphenhydrAMINE (BENADRYL) injection 25 mg  25 mg  Intravenous Daily PRN Earlie Server, MD   25 mg at 06/26/22 1405   diphenhydrAMINE (BENADRYL) injection 25 mg  25 mg Intravenous Daily PRN Earlie Server, MD       fluorouracil (ADRUCIL) 4,300 mg in sodium chloride 0.9 % 64 mL chemo infusion  2,400 mg/m2 (Treatment Plan Recorded) Intravenous 1 day or 1 dose Earlie Server, MD       heparin lock flush 100 unit/mL  500 Units Intracatheter Once PRN Earlie Server, MD       irinotecan (CAMPTOSAR) 180 mg in sodium chloride 0.9 % 500 mL chemo infusion  100 mg/m2 (Treatment Plan Recorded) Intravenous Once Earlie Server, MD       leucovorin 750 mg in sodium chloride 0.9 % 250 mL infusion  750 mg Intravenous Once Earlie Server, MD       sodium chloride flush (NS) 0.9 % injection 10 mL  10 mL Intracatheter PRN Earlie Server, MD         PHYSICAL EXAMINATION: ECOG PERFORMANCE STATUS: 1 - Symptomatic but completely ambulatory  Physical Exam Constitutional:      General: She is not in acute distress. HENT:     Head: Normocephalic and atraumatic.  Eyes:     General: No scleral icterus. Cardiovascular:     Rate and Rhythm: Normal rate and regular rhythm.     Heart sounds: Normal heart sounds.  Pulmonary:     Effort: Pulmonary effort is normal. No respiratory distress.     Breath sounds: No wheezing.  Abdominal:     General: Bowel sounds are normal. There is no distension.     Palpations: Abdomen is soft.     Comments: + Colostomy bag   Musculoskeletal:        General: No deformity. Normal range of motion.     Cervical back: Normal range of motion and neck supple.  Skin:    General: Skin is warm and dry.     Findings: No erythema or rash.  Neurological:     Mental Status: She is alert and oriented to person, place, and time. Mental status is at baseline.     Cranial Nerves: No cranial nerve deficit.     Coordination: Coordination normal.       LABORATORY DATA:  I have reviewed the data as listed    Latest Ref Rng & Units  08/28/2022    8:53 AM 08/14/2022    8:44 AM  07/24/2022    8:40 AM  CBC  WBC 4.0 - 10.5 K/uL 5.9  6.1  4.6   Hemoglobin 12.0 - 15.0 g/dL 11.6  11.8  11.7   Hematocrit 36.0 - 46.0 % 37.4  38.0  36.3   Platelets 150 - 400 K/uL 229  215  176       Latest Ref Rng & Units 08/28/2022    8:53 AM 08/14/2022    8:44 AM 07/24/2022    8:40 AM  CMP  Glucose 70 - 99 mg/dL 100  97  94   BUN 8 - 23 mg/dL _0 Creatinine 0.44 - 1.00 mg/dL 0.93  0.91  0.94   Sodium 135 - 145 mmol/L 138  136  137   Potassium 3.5 - 5.1 mmol/L 4.1  4.2  4.3   Chloride 98 - 111 mmol/L 104  106  105   CO2 22 - 32 mmol/L _1 Calcium 8.9 - 10.3 mg/dL 9.2  9.0  9.0   Total Protein 6.5 - 8.1 g/dL 6.7  6.9  6.8   Total Bilirubin 0.3 - 1.2 mg/dL 0.4  0.5  0.5   Alkaline Phos 38 - 126 U/L 68  60  59   AST 15 - 41 U/L 28  29  36   ALT 0 - 44 U/L _2 RADIOGRAPHIC STUDIES: I have personally reviewed the radiological images as listed and agreed with the findings in the report. CT CHEST ABDOMEN PELVIS W CONTRAST  Result Date: 07/18/2022 CLINICAL DATA:  Metastatic rectal adenocarcinoma. Ongoing chemotherapy. * Tracking Code: BO * EXAM: CT CHEST, ABDOMEN, AND PELVIS WITH CONTRAST TECHNIQUE: Multidetector CT imaging of the chest, abdomen and pelvis was performed following the standard protocol during bolus administration of intravenous contrast. RADIATION DOSE REDUCTION: This exam was performed according to the departmental dose-optimization program which includes automated exposure control, adjustment of the mA and/or kV according to patient size and/or use of iterative reconstruction technique. CONTRAST:  39m OMNIPAQUE IOHEXOL 300 MG/ML  SOLN COMPARISON:  04/26/2022 FINDINGS: CT CHEST FINDINGS Cardiovascular: Coronary, aortic arch, and branch vessel atherosclerotic vascular disease. Right Port-A-Cath tip: Cavoatrial junction. Mediastinum/Nodes: Unremarkable Lungs/Pleura: Generally stable small scattered bilateral pulmonary nodules. One of the largest  is a bilobed 8 by 6 mm right middle lobe nodule on image 106 series 3. No new nodules identified. Musculoskeletal: Degenerative glenohumeral arthropathy noted. Whole-body bone scan rib revealed accentuated activity in the right humeral head, on today's CT I do not see a specific cause for this accentuated activity, although MRI might be more sensitive. Thoracic spondylosis noted. CT ABDOMEN PELVIS FINDINGS Hepatobiliary: Gallbladder absent. Stable mild intrahepatic biliary dilatation, possibly a physiologic response to cholecystectomy. No significant focal liver lesion. Pancreas: Unremarkable Spleen: Unremarkable Adrenals/Urinary Tract: Stable appearance of bilateral benign-appearing renal cysts which do not warrant individual follow up. Adrenal glands unremarkable. Stomach/Bowel: Descending colostomy. There some small diverticula of the descending colon without findings of active diverticulitis. Pelvic floor laxity with loops of small bowel extending 3 cm below the pubococcygeal line. Vascular/Lymphatic: Atherosclerosis is present, including aortoiliac atherosclerotic disease. Atherosclerotic plaque at the origin of the celiac trunk and proximally in the SMA, causing stenosis in the proximal SMA no compelling findings of occlusion of the superior mesenteric artery. No pathologic adenopathy is identified. Reproductive: Uterus absent.  Adnexa unremarkable.  Other: No supplemental non-categorized findings. Musculoskeletal: Pelvic floor laxity. Subtle chronic posterior cortical irregularity and heterogeneity in the left iliac bone on image 82 of series 2, not appreciable on the bone scan of 07/14/2022, this has been present over the past year and could reflect radiation therapy related findings although strictly speaking, malignant involvement of the left iliac bone is difficult to exclude. Lumbar spondylosis and degenerative disc disease with mild resulting left foraminal stenosis at L4-5. IMPRESSION: 1. Stable  scattered small bilateral pulmonary nodules, largest 8 by 6 mm in the right middle lobe. 2. Subtle chronically stable posterior cortical irregularity and heterogeneity in the left iliac bone, not appreciable on the bone scan of 07/14/2022. This has been present at least over the past year and could reflect radiation therapy related findings although strictly speaking, malignant involvement of the left iliac bone is difficult to exclude. If clinically warranted this could be further assessed with nuclear medicine PET-CT or MRI of the bony pelvis with and without contrast. 3. Pelvic floor laxity with loops of small bowel extending 3 cm below the pubococcygeal line. 4. Aortic and systemic atherosclerosis as detailed above. Aortic Atherosclerosis (ICD10-I70.0). Electronically Signed   By: Van Clines M.D.   On: 07/18/2022 13:32   NM Bone Scan Whole Body  Result Date: 07/14/2022 CLINICAL DATA:  Metastatic carcinoma. Hip pain. Metastatic adenocarcinoma. Rectal carcinoma EXAM: NUCLEAR MEDICINE WHOLE BODY BONE SCAN TECHNIQUE: Whole body anterior and posterior images were obtained approximately 3 hours after intravenous injection of radiopharmaceutical. RADIOPHARMACEUTICALS:  21.9 mCi Technetium-72mMDP IV COMPARISON:  Hip radiograph 06/28/2022, CT chest abdomen pelvis 03/27/2022 FINDINGS: No abnormal radiotracer accumulation within the LEFT or RIGHT hip. No abnormal accumulation within the pelvis or spine. Intense uptake within the RIGHT humeral head mild uptake in the LEFT humeral head. No lesion identified in the RIGHT humeral head on CT 04/26/2022 (partially imaged). Additionally there was mild radiotracer activity associated with the glenohumeral joint on the RIGHT on FDG PET-CT scan 07/20/2020. This combination of findings favors degenerative uptake in the RIGHT humeral head. IMPRESSION: 1. No evidence of skeletal metastatic disease in the hips. 2. Asymmetric increased activity in the RIGHT humeral head is  favored degenerative as described above. If pain in the RIGHT shoulder consider further evaluation with plain film or MRI. Electronically Signed   By: SSuzy BouchardM.D.   On: 07/14/2022 12:59   DG HIP UNILAT W OR W/O PELVIS 2-3 VIEWS LEFT  Result Date: 06/30/2022 CLINICAL DATA:  Left hip pain. EXAM: DG HIP (WITH OR WITHOUT PELVIS) 2-3V LEFT COMPARISON:  None Available. FINDINGS: Both hips are normally located. Mild degenerative changes. No acute bony findings and no plain film evidence of AVN. The pubic symphysis and SI joints are intact. No pelvic fractures or bone lesions. IMPRESSION: Mild degenerative changes but no acute bony findings. Electronically Signed   By: PMarijo SanesM.D.   On: 06/30/2022 09:07      CT CHEST ABDOMEN PELVIS W CONTRAST  Result Date: 07/18/2022 CLINICAL DATA:  Metastatic rectal adenocarcinoma. Ongoing chemotherapy. * Tracking Code: BO * EXAM: CT CHEST, ABDOMEN, AND PELVIS WITH CONTRAST TECHNIQUE: Multidetector CT imaging of the chest, abdomen and pelvis was performed following the standard protocol during bolus administration of intravenous contrast. RADIATION DOSE REDUCTION: This exam was performed according to the departmental dose-optimization program which includes automated exposure control, adjustment of the mA and/or kV according to patient size and/or use of iterative reconstruction technique. CONTRAST:  763mOMNIPAQUE IOHEXOL 300 MG/ML  SOLN COMPARISON:  04/26/2022 FINDINGS: CT CHEST FINDINGS Cardiovascular: Coronary, aortic arch, and branch vessel atherosclerotic vascular disease. Right Port-A-Cath tip: Cavoatrial junction. Mediastinum/Nodes: Unremarkable Lungs/Pleura: Generally stable small scattered bilateral pulmonary nodules. One of the largest is a bilobed 8 by 6 mm right middle lobe nodule on image 106 series 3. No new nodules identified. Musculoskeletal: Degenerative glenohumeral arthropathy noted. Whole-body bone scan rib revealed accentuated activity in  the right humeral head, on today's CT I do not see a specific cause for this accentuated activity, although MRI might be more sensitive. Thoracic spondylosis noted. CT ABDOMEN PELVIS FINDINGS Hepatobiliary: Gallbladder absent. Stable mild intrahepatic biliary dilatation, possibly a physiologic response to cholecystectomy. No significant focal liver lesion. Pancreas: Unremarkable Spleen: Unremarkable Adrenals/Urinary Tract: Stable appearance of bilateral benign-appearing renal cysts which do not warrant individual follow up. Adrenal glands unremarkable. Stomach/Bowel: Descending colostomy. There some small diverticula of the descending colon without findings of active diverticulitis. Pelvic floor laxity with loops of small bowel extending 3 cm below the pubococcygeal line. Vascular/Lymphatic: Atherosclerosis is present, including aortoiliac atherosclerotic disease. Atherosclerotic plaque at the origin of the celiac trunk and proximally in the SMA, causing stenosis in the proximal SMA no compelling findings of occlusion of the superior mesenteric artery. No pathologic adenopathy is identified. Reproductive: Uterus absent.  Adnexa unremarkable. Other: No supplemental non-categorized findings. Musculoskeletal: Pelvic floor laxity. Subtle chronic posterior cortical irregularity and heterogeneity in the left iliac bone on image 82 of series 2, not appreciable on the bone scan of 07/14/2022, this has been present over the past year and could reflect radiation therapy related findings although strictly speaking, malignant involvement of the left iliac bone is difficult to exclude. Lumbar spondylosis and degenerative disc disease with mild resulting left foraminal stenosis at L4-5. IMPRESSION: 1. Stable scattered small bilateral pulmonary nodules, largest 8 by 6 mm in the right middle lobe. 2. Subtle chronically stable posterior cortical irregularity and heterogeneity in the left iliac bone, not appreciable on the bone scan  of 07/14/2022. This has been present at least over the past year and could reflect radiation therapy related findings although strictly speaking, malignant involvement of the left iliac bone is difficult to exclude. If clinically warranted this could be further assessed with nuclear medicine PET-CT or MRI of the bony pelvis with and without contrast. 3. Pelvic floor laxity with loops of small bowel extending 3 cm below the pubococcygeal line. 4. Aortic and systemic atherosclerosis as detailed above. Aortic Atherosclerosis (ICD10-I70.0). Electronically Signed   By: Van Clines M.D.   On: 07/18/2022 13:32   NM Bone Scan Whole Body  Result Date: 07/14/2022 CLINICAL DATA:  Metastatic carcinoma. Hip pain. Metastatic adenocarcinoma. Rectal carcinoma EXAM: NUCLEAR MEDICINE WHOLE BODY BONE SCAN TECHNIQUE: Whole body anterior and posterior images were obtained approximately 3 hours after intravenous injection of radiopharmaceutical. RADIOPHARMACEUTICALS:  21.9 mCi Technetium-42mMDP IV COMPARISON:  Hip radiograph 06/28/2022, CT chest abdomen pelvis 03/27/2022 FINDINGS: No abnormal radiotracer accumulation within the LEFT or RIGHT hip. No abnormal accumulation within the pelvis or spine. Intense uptake within the RIGHT humeral head mild uptake in the LEFT humeral head. No lesion identified in the RIGHT humeral head on CT 04/26/2022 (partially imaged). Additionally there was mild radiotracer activity associated with the glenohumeral joint on the RIGHT on FDG PET-CT scan 07/20/2020. This combination of findings favors degenerative uptake in the RIGHT humeral head. IMPRESSION: 1. No evidence of skeletal metastatic disease in the hips. 2. Asymmetric increased activity in the RIGHT humeral head is favored degenerative  as described above. If pain in the RIGHT shoulder consider further evaluation with plain film or MRI. Electronically Signed   By: Suzy Bouchard M.D.   On: 07/14/2022 12:59   DG HIP UNILAT W OR W/O  PELVIS 2-3 VIEWS LEFT  Result Date: 06/30/2022 CLINICAL DATA:  Left hip pain. EXAM: DG HIP (WITH OR WITHOUT PELVIS) 2-3V LEFT COMPARISON:  None Available. FINDINGS: Both hips are normally located. Mild degenerative changes. No acute bony findings and no plain film evidence of AVN. The pubic symphysis and SI joints are intact. No pelvic fractures or bone lesions. IMPRESSION: Mild degenerative changes but no acute bony findings. Electronically Signed   By: Marijo Sanes M.D.   On: 06/30/2022 09:07

## 2022-08-28 NOTE — Patient Instructions (Signed)
[facility] CANCER CTR AT [place]  Discharge Instructions: Thank you for choosing [facility] to provide your oncology and hematology care.  If you have a lab appointment with the [place], please go directly to the [place] and check in at the registration area.  Wear comfortable clothing and clothing appropriate for easy access to any Portacath or PICC line.   We strive to give you quality time with your provider. You may need to reschedule your appointment if you arrive late (15 or more minutes).  Arriving late affects you and other patients whose appointments are after yours.  Also, if you miss three or more appointments without notifying the office, you may be dismissed from the clinic at the provider's discretion.      For prescription refill requests, have your pharmacy contact our office and allow 72 hours for refills to be completed.    Today you received the following chemotherapy and/or immunotherapy agents- avastin, irinotecan, leucovorin, 5FU      To help prevent nausea and vomiting after your treatment, we encourage you to take your nausea medication as directed.  BELOW ARE SYMPTOMS THAT SHOULD BE REPORTED IMMEDIATELY: *FEVER GREATER THAN 100.4 F (38 C) OR HIGHER *CHILLS OR SWEATING *NAUSEA AND VOMITING THAT IS NOT CONTROLLED WITH YOUR NAUSEA MEDICATION *UNUSUAL SHORTNESS OF BREATH *UNUSUAL BRUISING OR BLEEDING *URINARY PROBLEMS (pain or burning when urinating, or frequent urination) *BOWEL PROBLEMS (unusual diarrhea, constipation, pain near the anus) TENDERNESS IN MOUTH AND THROAT WITH OR WITHOUT PRESENCE OF ULCERS (sore throat, sores in mouth, or a toothache) UNUSUAL RASH, SWELLING OR PAIN  UNUSUAL VAGINAL DISCHARGE OR ITCHING   Items with * indicate a potential emergency and should be followed up as soon as possible or go to the Emergency Department if any problems should occur.  Please show the CHEMOTHERAPY ALERT CARD or IMMUNOTHERAPY  ALERT CARD at check-in to the Emergency Department and triage nurse.  Should you have questions after your visit or need to cancel or reschedule your appointment, please contact [facility] CANCER [employer] AT [place]  [phone] and follow the prompts.  Office hours are 8:00 a.m. to 4:30 p.m. Monday - Friday. Please note that voicemails left after 4:00 p.m. may not be returned until the following business day.  We are closed weekends and major holidays. You have access to a nurse at all times for urgent questions. Please call the main number to the clinic [phone] and follow the prompts.  For any non-urgent questions, you may also contact your provider using MyChart. We now offer e-Visits for anyone 58 and older to request care online for non-urgent symptoms. For details visit [email or web address].   Also download the MyChart app! Go to the app store, search "MyChart", open the app, select [place], and log in with your MyChart username and password.  Masks are optional in the cancer centers. If you would like for your care team to wear a mask while they are taking care of you, please let them know. For doctor visits, patients may have with them one support person who is at least 75 years old. At this time, visitors are not allowed in the infusion area.

## 2022-08-28 NOTE — Assessment & Plan Note (Signed)
Continue  Eliquis 2.5 mg twice daily for anticoagulation prophylaxis. 

## 2022-08-28 NOTE — Assessment & Plan Note (Signed)
Hb is stable. observation 

## 2022-08-28 NOTE — Assessment & Plan Note (Addendum)
History of stage IIIC Rectal cancer, s/p TNT, followed by 09/17/19 APR/posterior vaginectomy/TAH/BSO/VY-flap, pT4b pN0 with close vaginal margin 0.2 mm.  Uterus and ovaries negative for malignancy. palliative radiation to vaginal recurrence- 01/19/21 recurrence with lung metastasis.-Palliative -FOLFIRI plus bevacizumab.  Irinotecan was dropped in November 2022 due to side effects. Negative for UGT1A1*28 - radiographically stable, rise of CEA-July 2023 CT showed slightly progression of lung metastasis. Labs are reviewed and discussed with patient. CEA progressively increases Proceed with dose reduced FOLFIRI-[Irinotecan 100 mg/m2] / bevacizumab- CT- bone scan- stable disease RAS wild type. Consider switch to irinotecan +panitumumab upon significant radiographic progression .  #posterior cortical irregularity and heterogeneity in the left iliac bone, can no r/o mets.  she has mild left hip pain. Secure message sent to Dr. Baruch Gouty to see if there is benefit of palliative RT. May need additional imaing work up.

## 2022-08-28 NOTE — Assessment & Plan Note (Signed)
Chemotherapy plan as listed above 

## 2022-08-29 LAB — CEA: CEA: 2075 ng/mL — ABNORMAL HIGH (ref 0.0–4.7)

## 2022-08-30 ENCOUNTER — Inpatient Hospital Stay: Payer: Medicare Other

## 2022-08-30 VITALS — BP 134/79 | HR 95 | Temp 98.3°F | Resp 16

## 2022-08-30 DIAGNOSIS — I10 Essential (primary) hypertension: Secondary | ICD-10-CM | POA: Diagnosis not present

## 2022-08-30 DIAGNOSIS — Z5189 Encounter for other specified aftercare: Secondary | ICD-10-CM | POA: Diagnosis not present

## 2022-08-30 DIAGNOSIS — Z5111 Encounter for antineoplastic chemotherapy: Secondary | ICD-10-CM | POA: Diagnosis not present

## 2022-08-30 DIAGNOSIS — K219 Gastro-esophageal reflux disease without esophagitis: Secondary | ICD-10-CM | POA: Diagnosis not present

## 2022-08-30 DIAGNOSIS — C7800 Secondary malignant neoplasm of unspecified lung: Secondary | ICD-10-CM | POA: Diagnosis not present

## 2022-08-30 DIAGNOSIS — Z933 Colostomy status: Secondary | ICD-10-CM | POA: Diagnosis not present

## 2022-08-30 DIAGNOSIS — Z800 Family history of malignant neoplasm of digestive organs: Secondary | ICD-10-CM | POA: Diagnosis not present

## 2022-08-30 DIAGNOSIS — T451X5A Adverse effect of antineoplastic and immunosuppressive drugs, initial encounter: Secondary | ICD-10-CM | POA: Diagnosis not present

## 2022-08-30 DIAGNOSIS — Z7901 Long term (current) use of anticoagulants: Secondary | ICD-10-CM | POA: Diagnosis not present

## 2022-08-30 DIAGNOSIS — D701 Agranulocytosis secondary to cancer chemotherapy: Secondary | ICD-10-CM | POA: Diagnosis not present

## 2022-08-30 DIAGNOSIS — Z86718 Personal history of other venous thrombosis and embolism: Secondary | ICD-10-CM | POA: Diagnosis not present

## 2022-08-30 DIAGNOSIS — Z79899 Other long term (current) drug therapy: Secondary | ICD-10-CM | POA: Diagnosis not present

## 2022-08-30 DIAGNOSIS — E7800 Pure hypercholesterolemia, unspecified: Secondary | ICD-10-CM | POA: Diagnosis not present

## 2022-08-30 DIAGNOSIS — R634 Abnormal weight loss: Secondary | ICD-10-CM | POA: Diagnosis not present

## 2022-08-30 DIAGNOSIS — M25552 Pain in left hip: Secondary | ICD-10-CM | POA: Diagnosis not present

## 2022-08-30 DIAGNOSIS — C20 Malignant neoplasm of rectum: Secondary | ICD-10-CM

## 2022-08-30 DIAGNOSIS — D6481 Anemia due to antineoplastic chemotherapy: Secondary | ICD-10-CM | POA: Diagnosis not present

## 2022-08-30 DIAGNOSIS — Z86711 Personal history of pulmonary embolism: Secondary | ICD-10-CM | POA: Diagnosis not present

## 2022-08-30 DIAGNOSIS — C799 Secondary malignant neoplasm of unspecified site: Secondary | ICD-10-CM

## 2022-08-30 DIAGNOSIS — M47814 Spondylosis without myelopathy or radiculopathy, thoracic region: Secondary | ICD-10-CM | POA: Diagnosis not present

## 2022-08-30 MED ORDER — HEPARIN SOD (PORK) LOCK FLUSH 100 UNIT/ML IV SOLN
500.0000 [IU] | Freq: Once | INTRAVENOUS | Status: AC | PRN
  Administered 2022-08-30: 500 [IU]
  Filled 2022-08-30: qty 5

## 2022-08-30 MED ORDER — PEGFILGRASTIM INJECTION 6 MG/0.6ML ~~LOC~~
6.0000 mg | PREFILLED_SYRINGE | Freq: Once | SUBCUTANEOUS | Status: AC
  Administered 2022-08-30: 6 mg via SUBCUTANEOUS
  Filled 2022-08-30: qty 0.6

## 2022-08-30 NOTE — Patient Instructions (Signed)
[facility] CANCER CTR AT [place]  Discharge Instructions: Thank you for choosing [place] to provide your oncology and hematology care.  If you have a lab appointment with the [place], please go directly to the [place] and check in at the registration area.  Wear comfortable clothing and clothing appropriate for easy access to any Portacath or PICC line.   We strive to give you quality time with your provider. You may need to reschedule your appointment if you arrive late (15 or more minutes).  Arriving late affects you and other patients whose appointments are after yours.  Also, if you miss three or more appointments without notifying the office, you may be dismissed from the clinic at the provider's discretion.      For prescription refill requests, have your pharmacy contact our office and allow 72 hours for refills to be completed.    Today you received the following chemotherapy and/or immunotherapy agents Neulasta      To help prevent nausea and vomiting after your treatment, we encourage you to take your nausea medication as directed.  BELOW ARE SYMPTOMS THAT SHOULD BE REPORTED IMMEDIATELY: *FEVER GREATER THAN 100.4 F (38 C) OR HIGHER *CHILLS OR SWEATING *NAUSEA AND VOMITING THAT IS NOT CONTROLLED WITH YOUR NAUSEA MEDICATION *UNUSUAL SHORTNESS OF BREATH *UNUSUAL BRUISING OR BLEEDING *URINARY PROBLEMS (pain or burning when urinating, or frequent urination) *BOWEL PROBLEMS (unusual diarrhea, constipation, pain near the anus) TENDERNESS IN MOUTH AND THROAT WITH OR WITHOUT PRESENCE OF ULCERS (sore throat, sores in mouth, or a toothache) UNUSUAL RASH, SWELLING OR PAIN  UNUSUAL VAGINAL DISCHARGE OR ITCHING   Items with * indicate a potential emergency and should be followed up as soon as possible or go to the Emergency Department if any problems should occur.  Please show the CHEMOTHERAPY ALERT CARD or IMMUNOTHERAPY ALERT CARD at check-in to  the Emergency Department and triage nurse.  Should you have questions after your visit or need to cancel or reschedule your appointment, please contact [facility] CANCER [place] AT [place]  [phone] and follow the prompts.  Office hours are 8:00 a.m. to 4:30 p.m. Monday - Friday. Please note that voicemails left after 4:00 p.m. may not be returned until the following business day.  We are closed weekends and major holidays. You have access to a nurse at all times for urgent questions. Please call the main number to the clinic [phone] and follow the prompts.  For any non-urgent questions, you may also contact your provider using MyChart. We now offer e-Visits for anyone 2 and older to request care online for non-urgent symptoms. For details visit [email or web address].   Also download the MyChart app! Go to the app store, search "MyChart", open the app, select [place], and log in with your MyChart username and password.  Masks are optional in the cancer centers. If you would like for your care team to wear a mask while they are taking care of you, please let them know. For doctor visits, patients may have with them one support person who is at least 75 years old. At this time, visitors are not allowed in the infusion area.

## 2022-09-01 DIAGNOSIS — Z933 Colostomy status: Secondary | ICD-10-CM | POA: Diagnosis not present

## 2022-09-04 ENCOUNTER — Ambulatory Visit: Payer: Medicare Other

## 2022-09-04 ENCOUNTER — Other Ambulatory Visit: Payer: Medicare Other

## 2022-09-04 ENCOUNTER — Ambulatory Visit: Payer: Medicare Other | Admitting: Oncology

## 2022-09-06 ENCOUNTER — Ambulatory Visit: Payer: Medicare Other | Admitting: Radiation Oncology

## 2022-09-06 MED FILL — Dexamethasone Sodium Phosphate Inj 100 MG/10ML: INTRAMUSCULAR | Qty: 1 | Status: AC

## 2022-09-11 ENCOUNTER — Inpatient Hospital Stay (HOSPITAL_BASED_OUTPATIENT_CLINIC_OR_DEPARTMENT_OTHER): Payer: Medicare Other | Admitting: Oncology

## 2022-09-11 ENCOUNTER — Telehealth: Payer: Self-pay | Admitting: Family Medicine

## 2022-09-11 ENCOUNTER — Inpatient Hospital Stay: Payer: Medicare Other

## 2022-09-11 ENCOUNTER — Encounter: Payer: Self-pay | Admitting: Oncology

## 2022-09-11 VITALS — BP 125/69 | HR 70 | Temp 98.6°F | Resp 18 | Wt 153.9 lb

## 2022-09-11 DIAGNOSIS — C20 Malignant neoplasm of rectum: Secondary | ICD-10-CM | POA: Diagnosis not present

## 2022-09-11 DIAGNOSIS — Z800 Family history of malignant neoplasm of digestive organs: Secondary | ICD-10-CM | POA: Diagnosis not present

## 2022-09-11 DIAGNOSIS — Z86711 Personal history of pulmonary embolism: Secondary | ICD-10-CM | POA: Diagnosis not present

## 2022-09-11 DIAGNOSIS — T451X5A Adverse effect of antineoplastic and immunosuppressive drugs, initial encounter: Secondary | ICD-10-CM | POA: Diagnosis not present

## 2022-09-11 DIAGNOSIS — C799 Secondary malignant neoplasm of unspecified site: Secondary | ICD-10-CM | POA: Diagnosis not present

## 2022-09-11 DIAGNOSIS — I10 Essential (primary) hypertension: Secondary | ICD-10-CM | POA: Diagnosis not present

## 2022-09-11 DIAGNOSIS — C7800 Secondary malignant neoplasm of unspecified lung: Secondary | ICD-10-CM | POA: Diagnosis not present

## 2022-09-11 DIAGNOSIS — E7800 Pure hypercholesterolemia, unspecified: Secondary | ICD-10-CM | POA: Diagnosis not present

## 2022-09-11 DIAGNOSIS — M25552 Pain in left hip: Secondary | ICD-10-CM | POA: Diagnosis not present

## 2022-09-11 DIAGNOSIS — R634 Abnormal weight loss: Secondary | ICD-10-CM | POA: Diagnosis not present

## 2022-09-11 DIAGNOSIS — Z7901 Long term (current) use of anticoagulants: Secondary | ICD-10-CM | POA: Diagnosis not present

## 2022-09-11 DIAGNOSIS — Z5111 Encounter for antineoplastic chemotherapy: Secondary | ICD-10-CM

## 2022-09-11 DIAGNOSIS — K219 Gastro-esophageal reflux disease without esophagitis: Secondary | ICD-10-CM | POA: Diagnosis not present

## 2022-09-11 DIAGNOSIS — Z933 Colostomy status: Secondary | ICD-10-CM | POA: Diagnosis not present

## 2022-09-11 DIAGNOSIS — Z5189 Encounter for other specified aftercare: Secondary | ICD-10-CM | POA: Diagnosis not present

## 2022-09-11 DIAGNOSIS — D701 Agranulocytosis secondary to cancer chemotherapy: Secondary | ICD-10-CM

## 2022-09-11 DIAGNOSIS — Z86718 Personal history of other venous thrombosis and embolism: Secondary | ICD-10-CM | POA: Diagnosis not present

## 2022-09-11 DIAGNOSIS — D6481 Anemia due to antineoplastic chemotherapy: Secondary | ICD-10-CM

## 2022-09-11 DIAGNOSIS — I129 Hypertensive chronic kidney disease with stage 1 through stage 4 chronic kidney disease, or unspecified chronic kidney disease: Secondary | ICD-10-CM

## 2022-09-11 DIAGNOSIS — Z79899 Other long term (current) drug therapy: Secondary | ICD-10-CM | POA: Diagnosis not present

## 2022-09-11 DIAGNOSIS — M47814 Spondylosis without myelopathy or radiculopathy, thoracic region: Secondary | ICD-10-CM | POA: Diagnosis not present

## 2022-09-11 LAB — CBC WITH DIFFERENTIAL/PLATELET
Abs Immature Granulocytes: 0.03 10*3/uL (ref 0.00–0.07)
Basophils Absolute: 0 10*3/uL (ref 0.0–0.1)
Basophils Relative: 0 %
Eosinophils Absolute: 0.2 10*3/uL (ref 0.0–0.5)
Eosinophils Relative: 4 %
HCT: 37.9 % (ref 36.0–46.0)
Hemoglobin: 12.1 g/dL (ref 12.0–15.0)
Immature Granulocytes: 1 %
Lymphocytes Relative: 24 %
Lymphs Abs: 1.3 10*3/uL (ref 0.7–4.0)
MCH: 30.6 pg (ref 26.0–34.0)
MCHC: 31.9 g/dL (ref 30.0–36.0)
MCV: 95.7 fL (ref 80.0–100.0)
Monocytes Absolute: 0.4 10*3/uL (ref 0.1–1.0)
Monocytes Relative: 8 %
Neutro Abs: 3.3 10*3/uL (ref 1.7–7.7)
Neutrophils Relative %: 63 %
Platelets: 204 10*3/uL (ref 150–400)
RBC: 3.96 MIL/uL (ref 3.87–5.11)
RDW: 18.1 % — ABNORMAL HIGH (ref 11.5–15.5)
WBC: 5.3 10*3/uL (ref 4.0–10.5)
nRBC: 0 % (ref 0.0–0.2)

## 2022-09-11 LAB — PROTEIN, URINE, RANDOM: Total Protein, Urine: 14 mg/dL

## 2022-09-11 LAB — COMPREHENSIVE METABOLIC PANEL
ALT: 16 U/L (ref 0–44)
AST: 29 U/L (ref 15–41)
Albumin: 3.9 g/dL (ref 3.5–5.0)
Alkaline Phosphatase: 74 U/L (ref 38–126)
Anion gap: 7 (ref 5–15)
BUN: 10 mg/dL (ref 8–23)
CO2: 27 mmol/L (ref 22–32)
Calcium: 9.3 mg/dL (ref 8.9–10.3)
Chloride: 101 mmol/L (ref 98–111)
Creatinine, Ser: 0.83 mg/dL (ref 0.44–1.00)
GFR, Estimated: >60 mL/min (ref >60)
Glucose, Bld: 95 mg/dL (ref 70–99)
Potassium: 3.6 mmol/L (ref 3.5–5.1)
Sodium: 135 mmol/L (ref 135–145)
Total Bilirubin: 0.5 mg/dL (ref 0.3–1.2)
Total Protein: 7.1 g/dL (ref 6.5–8.1)

## 2022-09-11 MED ORDER — SODIUM CHLORIDE 0.9 % IV SOLN
2400.0000 mg/m2 | INTRAVENOUS | Status: DC
  Administered 2022-09-11: 4300 mg via INTRAVENOUS
  Filled 2022-09-11: qty 86

## 2022-09-11 MED ORDER — SODIUM CHLORIDE 0.9 % IV SOLN
750.0000 mg | Freq: Once | INTRAVENOUS | Status: AC
  Administered 2022-09-11: 750 mg via INTRAVENOUS
  Filled 2022-09-11: qty 37.5

## 2022-09-11 MED ORDER — ATROPINE SULFATE 1 MG/ML IV SOLN
0.5000 mg | Freq: Once | INTRAVENOUS | Status: AC | PRN
  Administered 2022-09-11: 0.5 mg via INTRAVENOUS

## 2022-09-11 MED ORDER — DIPHENHYDRAMINE HCL 50 MG/ML IJ SOLN
25.0000 mg | Freq: Every day | INTRAMUSCULAR | Status: DC | PRN
  Administered 2022-09-11: 25 mg via INTRAVENOUS
  Filled 2022-09-11: qty 1

## 2022-09-11 MED ORDER — SODIUM CHLORIDE 0.9 % IV SOLN
10.0000 mg | Freq: Once | INTRAVENOUS | Status: AC
  Administered 2022-09-11: 10 mg via INTRAVENOUS
  Filled 2022-09-11: qty 10

## 2022-09-11 MED ORDER — PALONOSETRON HCL INJECTION 0.25 MG/5ML
0.2500 mg | Freq: Once | INTRAVENOUS | Status: AC
  Administered 2022-09-11: 0.25 mg via INTRAVENOUS
  Filled 2022-09-11: qty 5

## 2022-09-11 MED ORDER — SODIUM CHLORIDE 0.9 % IV SOLN
Freq: Once | INTRAVENOUS | Status: AC
  Filled 2022-09-11: qty 250

## 2022-09-11 MED ORDER — ACETAMINOPHEN 325 MG PO TABS
650.0000 mg | ORAL_TABLET | Freq: Four times a day (QID) | ORAL | Status: DC | PRN
  Administered 2022-09-11: 650 mg via ORAL
  Filled 2022-09-11: qty 2

## 2022-09-11 MED ORDER — SODIUM CHLORIDE 0.9 % IV SOLN
5.0000 mg/kg | Freq: Once | INTRAVENOUS | Status: AC
  Administered 2022-09-11: 350 mg via INTRAVENOUS
  Filled 2022-09-11: qty 14

## 2022-09-11 MED ORDER — POTASSIUM CHLORIDE CRYS ER 20 MEQ PO TBCR: 20.0000 meq | EXTENDED_RELEASE_TABLET | Freq: Every day | ORAL | 0 refills | Status: DC

## 2022-09-11 MED ORDER — SODIUM CHLORIDE 0.9 % IV SOLN
100.0000 mg/m2 | Freq: Once | INTRAVENOUS | Status: AC
  Administered 2022-09-11: 180 mg via INTRAVENOUS
  Filled 2022-09-11: qty 4

## 2022-09-11 NOTE — Progress Notes (Signed)
Hematology/Oncology Progress note [phone] [phone]      Patient Care Team: Olin Hauser, DO as PCP - General (Family Medicine) Clent Jacks, RN as Registered Nurse Earlie Server, MD as Consulting Physician (Hematology and Oncology) Cammie Sickle, MD as Consulting Physician (Oncology)  ASSESSMENT & PLAN:   Cancer Staging  Rectal cancer [facility]) Staging form: Colon and Rectum, AJCC 8th Edition - Pathologic stage from 10/06/2019: Stage IIC (ypT4b, pN0, cM0) - Signed by Earlie Server, MD on 10/06/2019 - Clinical stage from 06/30/2020: Jacqueline Yoder - Signed by Earlie Server, MD on 04/17/2022 - Pathologic: Stage Unknown (rpTX, pNX, cM1) - Signed by Earlie Server, MD on 01/31/2021   Rectal cancer [facility]) History of stage IIIC Rectal cancer, s/p TNT, followed by 09/17/19 APR/posterior vaginectomy/TAH/BSO/VY-flap, pT4b pN0 with close vaginal margin 0.2 mm.  Uterus and ovaries negative for malignancy. palliative radiation to vaginal recurrence- 01/19/21 recurrence with lung metastasis.-Palliative -FOLFIRI plus bevacizumab.  Irinotecan was dropped in November 2022 due to side effects. Negative for UGT1A1*28 - radiographically stable, rise of CEA-July 2023 CT showed slightly progression of lung metastasis. Labs are reviewed and discussed with patient. CEA progressively increases Proceed with dose reduced FOLFIRI-[Irinotecan 100 mg/m2] / bevacizumab- CT- bone scan- stable disease [RAS wild type. Consider switch to irinotecan +panitumumab upon significant radiographic progression]  #posterior cortical irregularity and heterogeneity in the left iliac bone, can no r/o mets.  she has mild left hip pain. Dr. Baruch Gouty recommends PET to see if there is benefit of palliative RT.  Encounter for antineoplastic chemotherapy Chemotherapy plan as listed above.   Anemia due to antineoplastic chemotherapy Hb is stable. observation  Chemotherapy induced neutropenia (HCC) patient  will receive long-acting GCSF   Ziextenzo prophylaxis on D3  History of pulmonary embolism Continue  Eliquis 2.5 mg twice daily for anticoagulation prophylaxis.   Follow up in 2 weeks. Lab MD next cycle of chemo. All questions were answered. The patient knows to call the clinic with any problems, questions or concerns.  Earlie Server, MD, PhD [facility] Hematology Oncology 09/11/2022      CHIEF COMPLAINTS/REASON FOR VISIT:  Follow up for rectal cancer  HISTORY OF PRESENTING ILLNESS:  Patient initially presented with complaints of postmenopausal bleeding on 08/16/2018.  History of was menopausal vaginal bleeding in 2016 which resulted in cervical polypectomy.  Pathology 02/04/2015 showed cervical polyp, consistent with benign endometrial polyp.  [name] after polypectomy due to anxiety associated with pelvic exams.  pelvic exam on 08/16/2018 reviewed cervical abnormality and from enlarged uterus. Seen by Dr. Marcelline Mates on 10/29/2018.  Endometrial biopsy and a Pap smear was performed. 10/29/2018 Pap smear showed adenocarcinoma, favor endometrial origin. 10/29/2018 endometrial biopsy showed endometrioid carcinoma, FIGO grade 1.  10/29/2018- TA & TV Ultrasound revealed: Anteverted uterus measuring 8.7 x 5.6 x 6.4 cm without evidence of focal masses.  The endometrium measuring 24.1 mm (thickened) and heterogeneous.  Right and left ovaries not visualized.  No adnexal masses identified.  No free fluid in cul-de-sac.  Patient was seen by Dr. Theora Gianotti in clinic on 11/13/2018.  Cervical exam reveals 2 cm exophytic irregular mass consistent with malignancy.   11/19/2018 CT chest abdomen pelvis with contrast showed thickened endometrium with some irregularity compatible with the provided diagnosis of endometrial malignancy.  There is a mildly prominent left inguinal node 1.4 cm.  Patient was seen by Dr. Fransisca Connors on 11/20/2018 and left groin lymph node biopsy was recommended.  11/26/2018 patient underwent  left inguinal lymph node  biopsy. Pathology showed metastatic adenocarcinoma consistent with colorectal origin.  CDX 2+.  Case was discussed on tumor board.  Recommend colonoscopy for further evaluation.  Patient reports significant weight loss 30 pounds over the last year.  Chronic vaginal spotting. Change of bowel habits the past few months.  More constipated.  Family history positive for brother who has colon cancer prostate cancer.  patient has underwent colonoscopy on 12/03/2018 which reviewed a nonobstructing large mass in the rectum.  Also chronic fistula.  Mass was not circumferential.  This was biopsied with a cold forceps for histology.  Pathology came back hyperplastic polyp negative for dysplasia and malignancy. [name], patient underwent flex sigmoidoscopy on 12/06/2018 with rebiopsy of the rectal mass. This time biopsy results came back positive for invasive colorectal adenocarcinoma, moderately differentiated. Immunotherapy for nearly mismatch repair protein (MMR ) was performed.  There is no loss of MMR expression.  low probability of MSI high.   # Seen by [name] for evaluation of resectability for rectal cancer. In addition, she also had a second opinion with [facility] where her endometrial biopsy pathology was changed to  adenocarcinoma, consistent with colorectal primary.   [name]. She has home health that has been assisting with ostomy care  Patient was also evaluated by [facility] oncology.  Recommendation is to proceed with TNT with concurrent chemoradiation followed by neoadjuvant chemotherapy followed by surgical resection. Patient prefers to have treatment done locally with [facility].   # Oncology Treatment:  02/03/2019- 03/19/2019  concurrent Xeloda and radiation.  Xeloda dose 827m /m2 BID - rounded to 16546mBID- on days of radiation. 04/09/2019, started on FOLFOX with bolus early.  Omitted.  07/16/2019  finished 8 cycles of FOLFOX. 09/17/19 APR/posterior vaginectomy/TAH/BSO/VY-flap pT4b pN0 with close vaginal margin 0.2 mm.  Uterus and ovaries negative for malignancy. Patient reports bilateral lower extremity numbness and tingling, intermittent, left worse than right. She has lost a lot of weight since her APR surgery.   #Family history with half brother having's history of colon cancer prostate cancer.  Personal history of colorectal cancer.  Patient has not decided if she wants genetic testing.    # history of PE( 01/13/2020)  in the bilateral lower extremity DVT (01/13/2020).   She finishes 6 months of anticoagulation with Eliquis 5 mg twice daily. Now switched to Eliquis 2.5 mg twice daily..  # She has now developed recurrent disease. #06/30/20  vaginal introitus mass biopsied. Pathology is consistent with metastatic colorectal adenocarcinoma I have discussed with [name]  Dr. MaHester Matesnd the mass is not resectable. Patient has also had colonoscopy by Dr. AnVicente Malesesterday. Normal examination. # 07/16/2020 cycle 1 FOLFIRI  # 07/20/2020 PET scan was done for further evaluation, images are consistent with local recurrence, no distant metastasis. #Discussed with radiation oncology Dr. ChBaruch Goutyill recommends concurrent chemotherapy and radiation. 08/02/2020-08/16/2020, patient starts radiation.  Xeloda was held due to neutropenia 08/17/2020,-09/06/2020 Xeloda 1500 mg twice daily concurrently with radiation  01/31/21 started on FOLFIRI + Bev 05/18/2021 CT chest abdomen pelvis showed Previously noted enlargement of bilateral inguinal lymph nodes is resolved, consistent with treatment response of nodal metastatic disease. Interval decrease in size of multiple small bilateral pulmonary nodules, consistent with treatment response of pulmonary metastatic disease. No evidence of new metastatic disease. 05/24/2021 - 08/30/2021, continued on FOLFIRI plus bevacizumab.  Irinotecan dose was reduced, eventually  10040m2  09/02/2021, CT chest abdomen pelvis without contrast Showed small bilateral pulmonary nodules,  unchanged.  Stable metastatic disease.  No noncontrast evidence of new metastatic disease in the chest abdomen pelvis.  Small parastomal hernia.  Enlargement of main pulmonary artery.  Coronary artery disease.  09/13/2021, maintenance 5-FU/bevacizumab 11/28/2021, 5-FU/Irinotecan/bevacizumab.  Irinotecan 100 mg/m2 was added back due to progressively increasing CEA.  INTERVAL HISTORY Jacqueline Yoder is a 76 y.o. female who has above history reviewed by me presents for follow-up of rectal cancer. Currently on dose reduced FOLFIRI/bevacizumab  she tolerates well. No significant increase of ostomy output, no nausea vomiting + left hip arthritis pain. She takes Tylenol PRN  Lost 4 pounds since last visit.    Review of Systems  Constitutional:  Positive for fatigue. Negative for appetite change, chills, fever and unexpected weight change.  HENT:   Negative for hearing loss and voice change.   Eyes:  Negative for eye problems.  Respiratory:  Negative for chest tightness and cough.   Cardiovascular:  Negative for chest pain.  Gastrointestinal:  Negative for abdominal distention, abdominal pain, blood in stool, constipation, diarrhea and nausea.  Endocrine: Negative for hot flashes.  Genitourinary:  Negative for difficulty urinating and frequency.   Musculoskeletal:  Positive for arthralgias.  Skin:  Negative for itching and rash.  Neurological:  Negative for extremity weakness and numbness.  Hematological:  Negative for adenopathy.  Psychiatric/Behavioral:  Negative for confusion.     MEDICAL HISTORY:  Past Medical History:  Diagnosis Date   Allergy    Arthritis    Blood clot in vein    Family history of colon cancer    GERD (gastroesophageal reflux disease)    Hypercholesteremia    Hypertension    Hypertension    Lower extremity edema    Personal history of chemotherapy    Rectal  cancer ([facility]) 12/2018   Urinary incontinence     SURGICAL HISTORY: Past Surgical History:  Procedure Laterality Date   ABDOMINAL HYSTERECTOMY     CHOLECYSTECTOMY  1971   COLONOSCOPY WITH PROPOFOL N/A 12/03/2018   Procedure: COLONOSCOPY WITH PROPOFOL;  Surgeon: Lucilla Lame, MD;  Location: [facility] ENDOSCOPY;  Service: Endoscopy;  Laterality: N/A;   COLONOSCOPY WITH PROPOFOL N/A 07/15/2020   Procedure: COLONOSCOPY WITH PROPOFOL;  Surgeon: [name], MD;  Location: [facility] ENDOSCOPY;  Service: Gastroenterology;  Laterality: N/A;   FLEXIBLE SIGMOIDOSCOPY N/A 12/06/2018   Procedure: FLEXIBLE SIGMOIDOSCOPY;  Surgeon: [name], MD;  Location: [facility] ENDOSCOPY;  Service: Endoscopy;  Laterality: N/A;   LAPAROSCOPIC COLOSTOMY  01/06/2019   PORTACATH PLACEMENT N/A 04/03/2019   Procedure: INSERTION PORT-A-CATH;  Surgeon: Jules Husbands, MD;  Location: [facility] ORS;  Service: General;  Laterality: N/A;    SOCIAL HISTORY: Social History   Socioeconomic History   Marital status: Widowed    Spouse name: Not on file   Number of children: Not on file   Years of education: Not on file   Highest education level: Not on file  Occupational History   Not on file  Tobacco Use   Smoking status: Former    Types: Cigarettes    Quit date: 12/02/1977    Years since quitting: 44.8   Smokeless tobacco: Former  Scientific laboratory technician Use: Never used  Substance and Sexual Activity   Alcohol use: Never   Drug use: Never   Sexual activity: Not Currently    Birth control/protection: None  Other Topics Concern   Not on file  Social History Narrative   Lives with daughter   Social Determinants of Health  Financial Resource Strain: Medium Risk (06/30/2022)   Overall Financial Resource Strain (CARDIA)    Difficulty of Paying Living Expenses: Somewhat hard  Food Insecurity: No Food Insecurity (06/30/2022)   Hunger Vital Sign    Worried About Running Out of Food in the Last Year: Never true    [place] in the  Last Year: Never true  Transportation Needs: No Transportation Needs (06/30/2022)   PRAPARE - Hydrologist (Medical): No    Lack of Transportation (Non-Medical): No  Physical Activity: Inactive (06/30/2022)   Exercise Vital Sign    Days of Exercise per Week: 0 days    Minutes of Exercise per Session: 0 min  Stress: No Stress Concern Present (06/30/2022)   [place]    Feeling of Stress : Not at all  Social Connections: Moderately Isolated (06/30/2022)   Social Connection and Isolation Panel [NHANES]    Frequency of Communication with Friends and Family: More than three times a week    Frequency of Social Gatherings with Friends and Family: Twice a week    Attends Religious Services: More than 4 times per year    Active Member of [employer] or Organizations: No    Attends Archivist Meetings: Never    Marital Status: Widowed  Intimate Partner Violence: Not At Risk (06/30/2022)   Humiliation, Afraid, Rape, and Kick questionnaire    Fear of Current or Ex-Partner: No    Emotionally Abused: No    Physically Abused: No    Sexually Abused: No    FAMILY HISTORY: Family History  Problem Relation Age of Onset   Colon cancer Brother 8       exposure to chemicals [place]   Hypertension Mother    Stroke Mother    Kidney failure Father    Breast cancer Neg Hx    Ovarian cancer Neg Hx     ALLERGIES:  is allergic to sulfamethoxazole-trimethoprim.  MEDICATIONS:  Current Outpatient Medications  Medication Sig Dispense Refill   benzonatate (TESSALON) 200 MG capsule Take 1 capsule (200 mg total) by mouth 3 (three) times daily as needed for cough. 30 capsule 0   Cholecalciferol (VITAMIN D3) 2000 units capsule Take 2,000 Units by mouth daily.     cholestyramine (QUESTRAN) 4 g packet Take 1 packet (4 g total) by mouth 3 (three) times daily. 90 each 1   diclofenac sodium (VOLTAREN) 1 % GEL Apply 2  g topically 4 (four) times daily as needed (joint pain).  11   diphenoxylate-atropine (LOMOTIL) 2.5-0.025 MG tablet Take 1 tablet by mouth 4 (four) times daily as needed for diarrhea or loose stools. 30 tablet 1   ELIQUIS 2.5 MG TABS tablet TAKE 1 TABLET BY MOUTH TWICE  DAILY 60 tablet 11   fluticasone (FLONASE) 50 MCG/ACT nasal spray USE 1 SPRAY IN EACH NOSTRIL ONCE DAILY 16 g 3   gabapentin (NEURONTIN) 100 MG capsule Take 1 capsule (100 mg total) by mouth at bedtime. 30 capsule 0   lidocaine-prilocaine (EMLA) cream Apply 1 application. topically as needed. 30 g 6   loperamide (IMODIUM) 2 MG capsule Take 1 capsule (2 mg total) by mouth See admin instructions. With onset of loose stool, take 62m followed by 266mevery 2 hours,  Maximum: 16 mg/day 120 capsule 1   loratadine (CLARITIN) 10 MG tablet Take 10 mg by mouth daily.     Multiple Vitamins-Minerals (ONE-A-DAY WOMENS 50 PLUS PO) Take  1 tablet by mouth daily.      potassium chloride SA (KLOR-CON M) 20 MEQ tablet Take 1 tablet (20 mEq total) by mouth daily. 90 tablet 0   simvastatin (ZOCOR) 40 MG tablet Take 1 tablet (40 mg total) by mouth at bedtime. 100 tablet 3   triamterene-hydrochlorothiazide (DYAZIDE) 37.5-25 MG capsule Take 1 each (1 capsule total) by mouth daily. 100 capsule 3   zinc gluconate 50 MG tablet Take 50 mg by mouth daily.     No current facility-administered medications for this visit.   Facility-Administered Medications Ordered in Other Visits  Medication Dose Route Frequency Provider Last Rate Last Admin   acetaminophen (TYLENOL) tablet 650 mg  650 mg Oral Q6H PRN Earlie Server, MD   650 mg at 09/11/22 0957   diphenhydrAMINE (BENADRYL) injection 25 mg  25 mg Intravenous Daily PRN Earlie Server, MD   25 mg at 06/26/22 1405   diphenhydrAMINE (BENADRYL) injection 25 mg  25 mg Intravenous Daily PRN Earlie Server, MD       fluorouracil (ADRUCIL) 4,300 mg in sodium chloride 0.9 % 64 mL chemo infusion  2,400 mg/m2 (Treatment Plan Recorded)  Intravenous 1 day or 1 dose Earlie Server, MD       irinotecan (CAMPTOSAR) 180 mg in sodium chloride 0.9 % 500 mL chemo infusion  100 mg/m2 (Treatment Plan Recorded) Intravenous Once Earlie Server, MD 339 mL/hr at 09/11/22 1118 180 mg at 09/11/22 1118   leucovorin 750 mg in sodium chloride 0.9 % 250 mL infusion  750 mg Intravenous Once Earlie Server, MD 192 mL/hr at 09/11/22 1117 750 mg at 09/11/22 1117     PHYSICAL EXAMINATION: ECOG PERFORMANCE STATUS: 1 - Symptomatic but completely ambulatory  Physical Exam Constitutional:      General: She is not in acute distress. HENT:     Head: Normocephalic and atraumatic.  Eyes:     General: No scleral icterus. Cardiovascular:     Rate and Rhythm: Normal rate and regular rhythm.     Heart sounds: Normal heart sounds.  Pulmonary:     Effort: Pulmonary effort is normal. No respiratory distress.     Breath sounds: No wheezing.  Abdominal:     General: Bowel sounds are normal. There is no distension.     Palpations: Abdomen is soft.     Comments: + Colostomy bag   Musculoskeletal:        General: No deformity. Normal range of motion.     Cervical back: Normal range of motion and neck supple.  Skin:    General: Skin is warm and dry.     Findings: No erythema or rash.  Neurological:     Mental Status: She is alert and oriented to person, place, and time. Mental status is at baseline.     Cranial Nerves: No cranial nerve deficit.     Coordination: Coordination normal.       LABORATORY DATA:  I have reviewed the data as listed    Latest Ref Rng & Units 09/11/2022    8:39 AM 08/28/2022    8:53 AM 08/14/2022    8:44 AM  CBC  WBC 4.0 - 10.5 K/uL 5.3  5.9  6.1   Hemoglobin 12.0 - 15.0 g/dL 12.1  11.6  11.8   Hematocrit 36.0 - 46.0 % 37.9  37.4  38.0   Platelets 150 - 400 K/uL 204  229  215       Latest Ref Rng & Units 09/11/2022  8:39 AM 08/28/2022    8:53 AM 08/14/2022    8:44 AM  CMP  Glucose 70 - 99 mg/dL 95  100  97   BUN 8 - 23 mg/dL  _0 Creatinine 0.44 - 1.00 mg/dL 0.83  0.93  0.91   Sodium 135 - 145 mmol/L 135  138  136   Potassium 3.5 - 5.1 mmol/L 3.6  4.1  4.2   Chloride 98 - 111 mmol/L 101  104  106   CO2 22 - 32 mmol/L _1 Calcium 8.9 - 10.3 mg/dL 9.3  9.2  9.0   Total Protein 6.5 - 8.1 g/dL 7.1  6.7  6.9   Total Bilirubin 0.3 - 1.2 mg/dL 0.5  0.4  0.5   Alkaline Phos 38 - 126 U/L 74  68  60   AST 15 - 41 U/L _2 ALT 0 - 44 U/L _3 RADIOGRAPHIC STUDIES: I have personally reviewed the radiological images as listed and agreed with the findings in the report. CT CHEST ABDOMEN PELVIS W CONTRAST  Result Date: 07/18/2022 CLINICAL DATA:  Metastatic rectal adenocarcinoma. Ongoing chemotherapy. * Tracking Code: BO * EXAM: CT CHEST, ABDOMEN, AND PELVIS WITH CONTRAST TECHNIQUE: Multidetector CT imaging of the chest, abdomen and pelvis was performed following the standard protocol during bolus administration of intravenous contrast. RADIATION DOSE REDUCTION: This exam was performed according to the departmental dose-optimization program which includes automated exposure control, adjustment of the mA and/or kV according to patient size and/or use of iterative reconstruction technique. CONTRAST:  29m OMNIPAQUE IOHEXOL 300 MG/ML  SOLN COMPARISON:  04/26/2022 FINDINGS: CT CHEST FINDINGS Cardiovascular: Coronary, aortic arch, and branch vessel atherosclerotic vascular disease. Right Port-A-Cath tip: Cavoatrial junction. Mediastinum/Nodes: Unremarkable Lungs/Pleura: Generally stable small scattered bilateral pulmonary nodules. One of the largest is a bilobed 8 by 6 mm right middle lobe nodule on image 106 series 3. No new nodules identified. Musculoskeletal: Degenerative glenohumeral arthropathy noted. Whole-body bone scan rib revealed accentuated activity in the right humeral head, on today's CT I do not see a specific cause for this accentuated activity, although MRI might be more sensitive.  Thoracic spondylosis noted. CT ABDOMEN PELVIS FINDINGS Hepatobiliary: Gallbladder absent. Stable mild intrahepatic biliary dilatation, possibly a physiologic response to cholecystectomy. No significant focal liver lesion. Pancreas: Unremarkable Spleen: Unremarkable Adrenals/Urinary Tract: Stable appearance of bilateral benign-appearing renal cysts which do not warrant individual follow up. Adrenal glands unremarkable. Stomach/Bowel: Descending colostomy. There some small diverticula of the descending colon without findings of active diverticulitis. Pelvic floor laxity with loops of small bowel extending 3 cm below the pubococcygeal line. Vascular/Lymphatic: Atherosclerosis is present, including aortoiliac atherosclerotic disease. Atherosclerotic plaque at the origin of the celiac trunk and proximally in the SMA, causing stenosis in the proximal SMA no compelling findings of occlusion of the superior mesenteric artery. No pathologic adenopathy is identified. Reproductive: Uterus absent.  Adnexa unremarkable. Other: No supplemental non-categorized findings. Musculoskeletal: Pelvic floor laxity. Subtle chronic posterior cortical irregularity and heterogeneity in the left iliac bone on image 82 of series 2, not appreciable on the bone scan of 07/14/2022, this has been present over the past year and could reflect radiation therapy related findings although strictly speaking, malignant involvement of the left iliac bone is difficult to exclude. Lumbar spondylosis and degenerative disc disease with mild resulting left foraminal stenosis at L4-5. IMPRESSION: 1. Stable scattered  small bilateral pulmonary nodules, largest 8 by 6 mm in the right middle lobe. 2. Subtle chronically stable posterior cortical irregularity and heterogeneity in the left iliac bone, not appreciable on the bone scan of 07/14/2022. This has been present at least over the past year and could reflect radiation therapy related findings although strictly  speaking, malignant involvement of the left iliac bone is difficult to exclude. If clinically warranted this could be further assessed with nuclear medicine PET-CT or MRI of the bony pelvis with and without contrast. 3. Pelvic floor laxity with loops of small bowel extending 3 cm below the pubococcygeal line. 4. Aortic and systemic atherosclerosis as detailed above. Aortic Atherosclerosis (ICD10-I70.0). Electronically Signed   By: Van Clines M.D.   On: 07/18/2022 13:32   NM Bone Scan Whole Body  Result Date: 07/14/2022 CLINICAL DATA:  Metastatic carcinoma. Hip pain. Metastatic adenocarcinoma. Rectal carcinoma EXAM: NUCLEAR MEDICINE WHOLE BODY BONE SCAN TECHNIQUE: Whole body anterior and posterior images were obtained approximately 3 hours after intravenous injection of radiopharmaceutical. RADIOPHARMACEUTICALS:  21.9 mCi Technetium-18mMDP IV COMPARISON:  Hip radiograph 06/28/2022, CT chest abdomen pelvis 03/27/2022 FINDINGS: No abnormal radiotracer accumulation within the LEFT or RIGHT hip. No abnormal accumulation within the pelvis or spine. Intense uptake within the RIGHT humeral head mild uptake in the LEFT humeral head. No lesion identified in the RIGHT humeral head on CT 04/26/2022 (partially imaged). Additionally there was mild radiotracer activity associated with the glenohumeral joint on the RIGHT on FDG PET-CT scan 07/20/2020. This combination of findings favors degenerative uptake in the RIGHT humeral head. IMPRESSION: 1. No evidence of skeletal metastatic disease in the hips. 2. Asymmetric increased activity in the RIGHT humeral head is favored degenerative as described above. If pain in the RIGHT shoulder consider further evaluation with plain film or MRI. Electronically Signed   By: SSuzy BouchardM.D.   On: 07/14/2022 12:59   DG HIP UNILAT W OR W/O PELVIS 2-3 VIEWS LEFT  Result Date: 06/30/2022 CLINICAL DATA:  Left hip pain. EXAM: DG HIP (WITH OR WITHOUT PELVIS) 2-3V LEFT COMPARISON:   None Available. FINDINGS: Both hips are normally located. Mild degenerative changes. No acute bony findings and no plain film evidence of AVN. The pubic symphysis and SI joints are intact. No pelvic fractures or bone lesions. IMPRESSION: Mild degenerative changes but no acute bony findings. Electronically Signed   By: PMarijo SanesM.D.   On: 06/30/2022 09:07      CT CHEST ABDOMEN PELVIS W CONTRAST  Result Date: 07/18/2022 CLINICAL DATA:  Metastatic rectal adenocarcinoma. Ongoing chemotherapy. * Tracking Code: BO * EXAM: CT CHEST, ABDOMEN, AND PELVIS WITH CONTRAST TECHNIQUE: Multidetector CT imaging of the chest, abdomen and pelvis was performed following the standard protocol during bolus administration of intravenous contrast. RADIATION DOSE REDUCTION: This exam was performed according to the departmental dose-optimization program which includes automated exposure control, adjustment of the mA and/or kV according to patient size and/or use of iterative reconstruction technique. CONTRAST:  773mOMNIPAQUE IOHEXOL 300 MG/ML  SOLN COMPARISON:  04/26/2022 FINDINGS: CT CHEST FINDINGS Cardiovascular: Coronary, aortic arch, and branch vessel atherosclerotic vascular disease. Right Port-A-Cath tip: Cavoatrial junction. Mediastinum/Nodes: Unremarkable Lungs/Pleura: Generally stable small scattered bilateral pulmonary nodules. One of the largest is a bilobed 8 by 6 mm right middle lobe nodule on image 106 series 3. No new nodules identified. Musculoskeletal: Degenerative glenohumeral arthropathy noted. Whole-body bone scan rib revealed accentuated activity in the right humeral head, on today's CT I do not see a  specific cause for this accentuated activity, although MRI might be more sensitive. Thoracic spondylosis noted. CT ABDOMEN PELVIS FINDINGS Hepatobiliary: Gallbladder absent. Stable mild intrahepatic biliary dilatation, possibly a physiologic response to cholecystectomy. No significant focal liver lesion.  Pancreas: Unremarkable Spleen: Unremarkable Adrenals/Urinary Tract: Stable appearance of bilateral benign-appearing renal cysts which do not warrant individual follow up. Adrenal glands unremarkable. Stomach/Bowel: Descending colostomy. There some small diverticula of the descending colon without findings of active diverticulitis. Pelvic floor laxity with loops of small bowel extending 3 cm below the pubococcygeal line. Vascular/Lymphatic: Atherosclerosis is present, including aortoiliac atherosclerotic disease. Atherosclerotic plaque at the origin of the celiac trunk and proximally in the SMA, causing stenosis in the proximal SMA no compelling findings of occlusion of the superior mesenteric artery. No pathologic adenopathy is identified. Reproductive: Uterus absent.  Adnexa unremarkable. Other: No supplemental non-categorized findings. Musculoskeletal: Pelvic floor laxity. Subtle chronic posterior cortical irregularity and heterogeneity in the left iliac bone on image 82 of series 2, not appreciable on the bone scan of 07/14/2022, this has been present over the past year and could reflect radiation therapy related findings although strictly speaking, malignant involvement of the left iliac bone is difficult to exclude. Lumbar spondylosis and degenerative disc disease with mild resulting left foraminal stenosis at L4-5. IMPRESSION: 1. Stable scattered small bilateral pulmonary nodules, largest 8 by 6 mm in the right middle lobe. 2. Subtle chronically stable posterior cortical irregularity and heterogeneity in the left iliac bone, not appreciable on the bone scan of 07/14/2022. This has been present at least over the past year and could reflect radiation therapy related findings although strictly speaking, malignant involvement of the left iliac bone is difficult to exclude. If clinically warranted this could be further assessed with nuclear medicine PET-CT or MRI of the bony pelvis with and without contrast. 3.  Pelvic floor laxity with loops of small bowel extending 3 cm below the pubococcygeal line. 4. Aortic and systemic atherosclerosis as detailed above. Aortic Atherosclerosis (ICD10-I70.0). Electronically Signed   By: Van Clines M.D.   On: 07/18/2022 13:32   NM Bone Scan Whole Body  Result Date: 07/14/2022 CLINICAL DATA:  Metastatic carcinoma. Hip pain. Metastatic adenocarcinoma. Rectal carcinoma EXAM: NUCLEAR MEDICINE WHOLE BODY BONE SCAN TECHNIQUE: Whole body anterior and posterior images were obtained approximately 3 hours after intravenous injection of radiopharmaceutical. RADIOPHARMACEUTICALS:  21.9 mCi Technetium-60mMDP IV COMPARISON:  Hip radiograph 06/28/2022, CT chest abdomen pelvis 03/27/2022 FINDINGS: No abnormal radiotracer accumulation within the LEFT or RIGHT hip. No abnormal accumulation within the pelvis or spine. Intense uptake within the RIGHT humeral head mild uptake in the LEFT humeral head. No lesion identified in the RIGHT humeral head on CT 04/26/2022 (partially imaged). Additionally there was mild radiotracer activity associated with the glenohumeral joint on the RIGHT on FDG PET-CT scan 07/20/2020. This combination of findings favors degenerative uptake in the RIGHT humeral head. IMPRESSION: 1. No evidence of skeletal metastatic disease in the hips. 2. Asymmetric increased activity in the RIGHT humeral head is favored degenerative as described above. If pain in the RIGHT shoulder consider further evaluation with plain film or MRI. Electronically Signed   By: SSuzy BouchardM.D.   On: 07/14/2022 12:59   DG HIP UNILAT W OR W/O PELVIS 2-3 VIEWS LEFT  Result Date: 06/30/2022 CLINICAL DATA:  Left hip pain. EXAM: DG HIP (WITH OR WITHOUT PELVIS) 2-3V LEFT COMPARISON:  None Available. FINDINGS: Both hips are normally located. Mild degenerative changes. No acute bony findings and no plain  film evidence of AVN. The pubic symphysis and SI joints are intact. No pelvic fractures or bone  lesions. IMPRESSION: Mild degenerative changes but no acute bony findings. Electronically Signed   By: Marijo Sanes M.D.   On: 06/30/2022 09:07

## 2022-09-11 NOTE — Progress Notes (Signed)
Pt here for follow up. No new concerns voiced.   

## 2022-09-11 NOTE — Assessment & Plan Note (Signed)
Hb is stable. observation 

## 2022-09-11 NOTE — Assessment & Plan Note (Signed)
patient will receive long-acting GCSF   Ziextenzo prophylaxis on D3

## 2022-09-11 NOTE — Assessment & Plan Note (Signed)
Continue  Eliquis 2.5 mg twice daily for anticoagulation prophylaxis. 

## 2022-09-11 NOTE — Assessment & Plan Note (Addendum)
History of stage IIIC Rectal cancer, s/p TNT, followed by 09/17/19 APR/posterior vaginectomy/TAH/BSO/VY-flap, pT4b pN0 with close vaginal margin 0.2 mm.  Uterus and ovaries negative for malignancy. palliative radiation to vaginal recurrence- 01/19/21 recurrence with lung metastasis.-Palliative -FOLFIRI plus bevacizumab.  Irinotecan was dropped in November 2022 due to side effects. Negative for UGT1A1*28 - radiographically stable, rise of CEA-July 2023 CT showed slightly progression of lung metastasis. Labs are reviewed and discussed with patient. CEA progressively increases Proceed with dose reduced FOLFIRI-[Irinotecan 100 mg/m2] / bevacizumab- CT- bone scan- stable disease [RAS wild type. Consider switch to irinotecan +panitumumab upon significant radiographic progression]  #posterior cortical irregularity and heterogeneity in the left iliac bone, can no r/o mets.  she has mild left hip pain. Dr. Baruch Gouty recommends PET to see if there is benefit of palliative RT.

## 2022-09-11 NOTE — Assessment & Plan Note (Signed)
Chemotherapy plan as listed above 

## 2022-09-11 NOTE — Telephone Encounter (Signed)
Medication Refill - Medication: triamterene-hydrochlorothiazide (DYAZIDE) 37.5-25 MG capsule   Has the patient contacted their pharmacy? Yes.   Daughter [name] calling to ask that this Rx be sent to the local [facility].  She says [facility] called and advised they are unable to get this medication, and have it sent local.  Preferred Pharmacy (with phone number or street name):  [facility] ([facility] [place] [facility] [place] [address]    Has the patient been seen for an appointment in the last year OR does the patient have an upcoming appointment? Yes.   Agent: Please be advised that RX refills may take up to 3 business days. We ask that you follow-up with your pharmacy.

## 2022-09-12 LAB — CEA: CEA: 2701 ng/mL — ABNORMAL HIGH (ref 0.0–4.7)

## 2022-09-13 ENCOUNTER — Inpatient Hospital Stay: Payer: Medicare Other

## 2022-09-13 DIAGNOSIS — C20 Malignant neoplasm of rectum: Secondary | ICD-10-CM

## 2022-09-13 DIAGNOSIS — C799 Secondary malignant neoplasm of unspecified site: Secondary | ICD-10-CM

## 2022-09-13 DIAGNOSIS — Z79899 Other long term (current) drug therapy: Secondary | ICD-10-CM | POA: Diagnosis not present

## 2022-09-13 DIAGNOSIS — R634 Abnormal weight loss: Secondary | ICD-10-CM | POA: Diagnosis not present

## 2022-09-13 DIAGNOSIS — D6481 Anemia due to antineoplastic chemotherapy: Secondary | ICD-10-CM | POA: Diagnosis not present

## 2022-09-13 DIAGNOSIS — T451X5A Adverse effect of antineoplastic and immunosuppressive drugs, initial encounter: Secondary | ICD-10-CM | POA: Diagnosis not present

## 2022-09-13 DIAGNOSIS — Z86711 Personal history of pulmonary embolism: Secondary | ICD-10-CM | POA: Diagnosis not present

## 2022-09-13 DIAGNOSIS — M25552 Pain in left hip: Secondary | ICD-10-CM | POA: Diagnosis not present

## 2022-09-13 DIAGNOSIS — Z7901 Long term (current) use of anticoagulants: Secondary | ICD-10-CM | POA: Diagnosis not present

## 2022-09-13 DIAGNOSIS — D701 Agranulocytosis secondary to cancer chemotherapy: Secondary | ICD-10-CM | POA: Diagnosis not present

## 2022-09-13 DIAGNOSIS — C7800 Secondary malignant neoplasm of unspecified lung: Secondary | ICD-10-CM | POA: Diagnosis not present

## 2022-09-13 DIAGNOSIS — K219 Gastro-esophageal reflux disease without esophagitis: Secondary | ICD-10-CM | POA: Diagnosis not present

## 2022-09-13 DIAGNOSIS — M47814 Spondylosis without myelopathy or radiculopathy, thoracic region: Secondary | ICD-10-CM | POA: Diagnosis not present

## 2022-09-13 DIAGNOSIS — I10 Essential (primary) hypertension: Secondary | ICD-10-CM | POA: Diagnosis not present

## 2022-09-13 DIAGNOSIS — Z5111 Encounter for antineoplastic chemotherapy: Secondary | ICD-10-CM | POA: Diagnosis not present

## 2022-09-13 DIAGNOSIS — Z800 Family history of malignant neoplasm of digestive organs: Secondary | ICD-10-CM | POA: Diagnosis not present

## 2022-09-13 DIAGNOSIS — E7800 Pure hypercholesterolemia, unspecified: Secondary | ICD-10-CM | POA: Diagnosis not present

## 2022-09-13 DIAGNOSIS — Z86718 Personal history of other venous thrombosis and embolism: Secondary | ICD-10-CM | POA: Diagnosis not present

## 2022-09-13 DIAGNOSIS — Z5189 Encounter for other specified aftercare: Secondary | ICD-10-CM | POA: Diagnosis not present

## 2022-09-13 MED ORDER — HEPARIN SOD (PORK) LOCK FLUSH 100 UNIT/ML IV SOLN
500.0000 [IU] | Freq: Once | INTRAVENOUS | Status: AC | PRN
  Administered 2022-09-13: 500 [IU]
  Filled 2022-09-13: qty 5

## 2022-09-13 MED ORDER — SODIUM CHLORIDE 0.9% FLUSH
10.0000 mL | INTRAVENOUS | Status: DC | PRN
  Administered 2022-09-13: 10 mL
  Filled 2022-09-13: qty 10

## 2022-09-13 MED ORDER — PEGFILGRASTIM INJECTION 6 MG/0.6ML ~~LOC~~
6.0000 mg | PREFILLED_SYRINGE | Freq: Once | SUBCUTANEOUS | Status: AC
  Administered 2022-09-13: 6 mg via SUBCUTANEOUS

## 2022-09-14 ENCOUNTER — Encounter: Payer: Self-pay | Admitting: Oncology

## 2022-09-18 ENCOUNTER — Ambulatory Visit: Payer: Medicare Other | Admitting: Oncology

## 2022-09-18 ENCOUNTER — Ambulatory Visit: Payer: Medicare Other

## 2022-09-18 ENCOUNTER — Other Ambulatory Visit: Payer: Medicare Other

## 2022-09-19 ENCOUNTER — Ambulatory Visit
Admission: RE | Admit: 2022-09-19 | Discharge: 2022-09-19 | Disposition: A | Discharge status: Home / Self Care | Payer: Medicare Other | Source: Ambulatory Visit | Attending: Oncology | Admitting: Oncology

## 2022-09-19 DIAGNOSIS — I700 Atherosclerosis of aorta: Secondary | ICD-10-CM | POA: Insufficient documentation

## 2022-09-19 DIAGNOSIS — R918 Other nonspecific abnormal finding of lung field: Secondary | ICD-10-CM | POA: Diagnosis not present

## 2022-09-19 DIAGNOSIS — C799 Secondary malignant neoplasm of unspecified site: Secondary | ICD-10-CM | POA: Insufficient documentation

## 2022-09-19 DIAGNOSIS — Z933 Colostomy status: Secondary | ICD-10-CM | POA: Diagnosis not present

## 2022-09-19 DIAGNOSIS — C20 Malignant neoplasm of rectum: Secondary | ICD-10-CM | POA: Diagnosis not present

## 2022-09-19 LAB — GLUCOSE, CAPILLARY: Glucose-Capillary: 82 mg/dL (ref 70–99)

## 2022-09-19 MED ORDER — FLUDEOXYGLUCOSE F - 18 (FDG) INJECTION
8.0000 | Freq: Once | INTRAVENOUS | Status: AC | PRN
  Administered 2022-09-19: 8.74 via INTRAVENOUS

## 2022-09-22 DIAGNOSIS — L821 Other seborrheic keratosis: Secondary | ICD-10-CM | POA: Diagnosis not present

## 2022-09-22 DIAGNOSIS — L708 Other acne: Secondary | ICD-10-CM | POA: Diagnosis not present

## 2022-09-22 MED FILL — Dexamethasone Sodium Phosphate Inj 100 MG/10ML: INTRAMUSCULAR | Qty: 1 | Status: AC

## 2022-09-25 ENCOUNTER — Inpatient Hospital Stay: Payer: Medicare Other

## 2022-09-25 ENCOUNTER — Inpatient Hospital Stay: Payer: Medicare Other | Attending: Oncology

## 2022-09-25 ENCOUNTER — Encounter: Payer: Self-pay | Admitting: Oncology

## 2022-09-25 ENCOUNTER — Inpatient Hospital Stay (HOSPITAL_BASED_OUTPATIENT_CLINIC_OR_DEPARTMENT_OTHER): Payer: Medicare Other | Admitting: Oncology

## 2022-09-25 VITALS — Resp 18

## 2022-09-25 VITALS — BP 116/78 | HR 77 | Temp 98.2°F | Wt 153.7 lb

## 2022-09-25 DIAGNOSIS — Z86711 Personal history of pulmonary embolism: Secondary | ICD-10-CM | POA: Insufficient documentation

## 2022-09-25 DIAGNOSIS — Z79899 Other long term (current) drug therapy: Secondary | ICD-10-CM | POA: Insufficient documentation

## 2022-09-25 DIAGNOSIS — T451X5A Adverse effect of antineoplastic and immunosuppressive drugs, initial encounter: Secondary | ICD-10-CM | POA: Insufficient documentation

## 2022-09-25 DIAGNOSIS — Z7901 Long term (current) use of anticoagulants: Secondary | ICD-10-CM | POA: Diagnosis not present

## 2022-09-25 DIAGNOSIS — M25552 Pain in left hip: Secondary | ICD-10-CM | POA: Diagnosis not present

## 2022-09-25 DIAGNOSIS — Z87891 Personal history of nicotine dependence: Secondary | ICD-10-CM | POA: Diagnosis not present

## 2022-09-25 DIAGNOSIS — I10 Essential (primary) hypertension: Secondary | ICD-10-CM | POA: Insufficient documentation

## 2022-09-25 DIAGNOSIS — E7800 Pure hypercholesterolemia, unspecified: Secondary | ICD-10-CM | POA: Diagnosis not present

## 2022-09-25 DIAGNOSIS — D6481 Anemia due to antineoplastic chemotherapy: Secondary | ICD-10-CM

## 2022-09-25 DIAGNOSIS — Z9221 Personal history of antineoplastic chemotherapy: Secondary | ICD-10-CM | POA: Insufficient documentation

## 2022-09-25 DIAGNOSIS — I700 Atherosclerosis of aorta: Secondary | ICD-10-CM | POA: Diagnosis not present

## 2022-09-25 DIAGNOSIS — Z933 Colostomy status: Secondary | ICD-10-CM | POA: Diagnosis not present

## 2022-09-25 DIAGNOSIS — K435 Parastomal hernia without obstruction or  gangrene: Secondary | ICD-10-CM | POA: Insufficient documentation

## 2022-09-25 DIAGNOSIS — C20 Malignant neoplasm of rectum: Secondary | ICD-10-CM | POA: Insufficient documentation

## 2022-09-25 DIAGNOSIS — J0101 Acute recurrent maxillary sinusitis: Secondary | ICD-10-CM | POA: Diagnosis not present

## 2022-09-25 DIAGNOSIS — R634 Abnormal weight loss: Secondary | ICD-10-CM | POA: Insufficient documentation

## 2022-09-25 DIAGNOSIS — D701 Agranulocytosis secondary to cancer chemotherapy: Secondary | ICD-10-CM | POA: Diagnosis not present

## 2022-09-25 DIAGNOSIS — Z86718 Personal history of other venous thrombosis and embolism: Secondary | ICD-10-CM | POA: Insufficient documentation

## 2022-09-25 DIAGNOSIS — C799 Secondary malignant neoplasm of unspecified site: Secondary | ICD-10-CM

## 2022-09-25 DIAGNOSIS — Z800 Family history of malignant neoplasm of digestive organs: Secondary | ICD-10-CM | POA: Insufficient documentation

## 2022-09-25 DIAGNOSIS — C7800 Secondary malignant neoplasm of unspecified lung: Secondary | ICD-10-CM | POA: Insufficient documentation

## 2022-09-25 DIAGNOSIS — Z5111 Encounter for antineoplastic chemotherapy: Secondary | ICD-10-CM | POA: Insufficient documentation

## 2022-09-25 DIAGNOSIS — I2510 Atherosclerotic heart disease of native coronary artery without angina pectoris: Secondary | ICD-10-CM | POA: Insufficient documentation

## 2022-09-25 DIAGNOSIS — K219 Gastro-esophageal reflux disease without esophagitis: Secondary | ICD-10-CM | POA: Insufficient documentation

## 2022-09-25 DIAGNOSIS — J0100 Acute maxillary sinusitis, unspecified: Secondary | ICD-10-CM | POA: Insufficient documentation

## 2022-09-25 DIAGNOSIS — K5900 Constipation, unspecified: Secondary | ICD-10-CM | POA: Diagnosis not present

## 2022-09-25 LAB — COMPREHENSIVE METABOLIC PANEL
ALT: 32 U/L (ref 0–44)
AST: 33 U/L (ref 15–41)
Albumin: 4 g/dL (ref 3.5–5.0)
Alkaline Phosphatase: 95 U/L (ref 38–126)
Anion gap: 9 (ref 5–15)
BUN: 13 mg/dL (ref 8–23)
CO2: 27 mmol/L (ref 22–32)
Calcium: 9.1 mg/dL (ref 8.9–10.3)
Chloride: 102 mmol/L (ref 98–111)
Creatinine, Ser: 0.84 mg/dL (ref 0.44–1.00)
GFR, Estimated: >60 mL/min (ref >60)
Glucose, Bld: 95 mg/dL (ref 70–99)
Potassium: 4 mmol/L (ref 3.5–5.1)
Sodium: 138 mmol/L (ref 135–145)
Total Bilirubin: 0.5 mg/dL (ref 0.3–1.2)
Total Protein: 6.9 g/dL (ref 6.5–8.1)

## 2022-09-25 LAB — CBC WITH DIFFERENTIAL/PLATELET
Abs Immature Granulocytes: 0.05 10*3/uL (ref 0.00–0.07)
Basophils Absolute: 0 10*3/uL (ref 0.0–0.1)
Basophils Relative: 1 %
Eosinophils Absolute: 0.1 10*3/uL (ref 0.0–0.5)
Eosinophils Relative: 2 %
HCT: 39.2 % (ref 36.0–46.0)
Hemoglobin: 12.2 g/dL (ref 12.0–15.0)
Immature Granulocytes: 1 %
Lymphocytes Relative: 32 %
Lymphs Abs: 1.8 10*3/uL (ref 0.7–4.0)
MCH: 30 pg (ref 26.0–34.0)
MCHC: 31.1 g/dL (ref 30.0–36.0)
MCV: 96.6 fL (ref 80.0–100.0)
Monocytes Absolute: 0.6 10*3/uL (ref 0.1–1.0)
Monocytes Relative: 10 %
Neutro Abs: 3.1 10*3/uL (ref 1.7–7.7)
Neutrophils Relative %: 54 %
Platelets: 228 10*3/uL (ref 150–400)
RBC: 4.06 MIL/uL (ref 3.87–5.11)
RDW: 18.2 % — ABNORMAL HIGH (ref 11.5–15.5)
WBC: 5.7 10*3/uL (ref 4.0–10.5)
nRBC: 0 % (ref 0.0–0.2)

## 2022-09-25 LAB — PROTEIN, URINE, RANDOM: Total Protein, Urine: 18 mg/dL

## 2022-09-25 MED ORDER — SODIUM CHLORIDE 0.9 % IV SOLN
Freq: Once | INTRAVENOUS | Status: AC
  Filled 2022-09-25: qty 250

## 2022-09-25 MED ORDER — ACETAMINOPHEN 325 MG PO TABS
650.0000 mg | ORAL_TABLET | Freq: Four times a day (QID) | ORAL | Status: DC | PRN
  Administered 2022-09-25: 650 mg via ORAL

## 2022-09-25 MED ORDER — SODIUM CHLORIDE 0.9 % IV SOLN
100.0000 mg/m2 | Freq: Once | INTRAVENOUS | Status: AC
  Administered 2022-09-25: 180 mg via INTRAVENOUS
  Filled 2022-09-25: qty 5

## 2022-09-25 MED ORDER — SODIUM CHLORIDE 0.9 % IV SOLN
2400.0000 mg/m2 | INTRAVENOUS | Status: DC
  Administered 2022-09-25: 4300 mg via INTRAVENOUS
  Filled 2022-09-25: qty 86

## 2022-09-25 MED ORDER — DIPHENHYDRAMINE HCL 50 MG/ML IJ SOLN
25.0000 mg | Freq: Every day | INTRAMUSCULAR | Status: DC | PRN
  Administered 2022-09-25: 25 mg via INTRAVENOUS
  Filled 2022-09-25: qty 1

## 2022-09-25 MED ORDER — PALONOSETRON HCL INJECTION 0.25 MG/5ML
0.2500 mg | Freq: Once | INTRAVENOUS | Status: AC
  Administered 2022-09-25: 0.25 mg via INTRAVENOUS
  Filled 2022-09-25: qty 5

## 2022-09-25 MED ORDER — SODIUM CHLORIDE 0.9 % IV SOLN
5.0000 mg/kg | Freq: Once | INTRAVENOUS | Status: AC
  Administered 2022-09-25: 350 mg via INTRAVENOUS
  Filled 2022-09-25: qty 14

## 2022-09-25 MED ORDER — SODIUM CHLORIDE 0.9 % IV SOLN
10.0000 mg | Freq: Once | INTRAVENOUS | Status: AC
  Administered 2022-09-25: 10 mg via INTRAVENOUS
  Filled 2022-09-25: qty 10

## 2022-09-25 MED ORDER — SODIUM CHLORIDE 0.9 % IV SOLN
750.0000 mg | Freq: Once | INTRAVENOUS | Status: AC
  Administered 2022-09-25: 750 mg via INTRAVENOUS
  Filled 2022-09-25: qty 37.5

## 2022-09-25 MED ORDER — AMOXICILLIN-POT CLAVULANATE 875-125 MG PO TABS: 1.0000 | ORAL_TABLET | Freq: Two times a day (BID) | ORAL | 0 refills | Status: DC

## 2022-09-25 MED ORDER — ATROPINE SULFATE 1 MG/ML IV SOLN
0.5000 mg | Freq: Once | INTRAVENOUS | Status: AC | PRN
  Administered 2022-09-25: 0.5 mg via INTRAVENOUS
  Filled 2022-09-25: qty 1

## 2022-09-25 NOTE — Assessment & Plan Note (Signed)
Chemotherapy plan as listed above 

## 2022-09-25 NOTE — Assessment & Plan Note (Signed)
Acute on chronic symptoms.  Recommend Trials of Augmentin treatments. If no improvement, recommend ENT evaluation.

## 2022-09-25 NOTE — Assessment & Plan Note (Signed)
Continue  Eliquis 2.5 mg twice daily for anticoagulation prophylaxis. 

## 2022-09-25 NOTE — Assessment & Plan Note (Signed)
Hb is stable. observation 

## 2022-09-25 NOTE — Patient Instructions (Signed)
[facility]  Discharge Instructions: Thank you for choosing [facility] to provide your oncology and hematology care.  If you have a lab appointment with the Cancer Center, please go directly to the Cancer Center and check in at the registration area.  Wear comfortable clothing and clothing appropriate for easy access to any Portacath or PICC line.   We strive to give you quality time with your provider. You may need to reschedule your appointment if you arrive late (15 or more minutes).  Arriving late affects you and other patients whose appointments are after yours.  Also, if you miss three or more appointments without notifying the office, you may be dismissed from the clinic at the provider's discretion.      For prescription refill requests, have your pharmacy contact our office and allow 72 hours for refills to be completed.       To help prevent nausea and vomiting after your treatment, we encourage you to take your nausea medication as directed.  BELOW ARE SYMPTOMS THAT SHOULD BE REPORTED IMMEDIATELY: *FEVER GREATER THAN 100.4 F (38 C) OR HIGHER *CHILLS OR SWEATING *NAUSEA AND VOMITING THAT IS NOT CONTROLLED WITH YOUR NAUSEA MEDICATION *UNUSUAL SHORTNESS OF BREATH *UNUSUAL BRUISING OR BLEEDING *URINARY PROBLEMS (pain or burning when urinating, or frequent urination) *BOWEL PROBLEMS (unusual diarrhea, constipation, pain near the anus) TENDERNESS IN MOUTH AND THROAT WITH OR WITHOUT PRESENCE OF ULCERS (sore throat, sores in mouth, or a toothache) UNUSUAL RASH, SWELLING OR PAIN  UNUSUAL VAGINAL DISCHARGE OR ITCHING   Items with * indicate a potential emergency and should be followed up as soon as possible or go to the Emergency Department if any problems should occur.  Please show the CHEMOTHERAPY ALERT CARD or IMMUNOTHERAPY ALERT CARD at check-in to the Emergency Department and triage nurse.  Should you have questions after your  visit or need to cancel or reschedule your appointment, please contact [facility]  [phone] and follow the prompts.  Office hours are 8:00 a.m. to 4:30 p.m. Monday - Friday. Please note that voicemails left after 4:00 p.m. may not be returned until the following business day.  We are closed weekends and major holidays. You have access to a nurse at all times for urgent questions. Please call the main number to the clinic [phone] and follow the prompts.  For any non-urgent questions, you may also contact your provider using MyChart. We now offer e-Visits for anyone 18 and older to request care online for non-urgent symptoms. For details visit [email or web address].   Also download the MyChart app! Go to the app store, search "MyChart", open the app, select [facility], and log in with your MyChart username and password.  Masks are optional in the cancer centers. If you would like for your care team to wear a mask while they are taking care of you, please let them know. For doctor visits, patients may have with them one support person who is at least 75 years old. At this time, visitors are not allowed in the infusion area.   

## 2022-09-25 NOTE — Assessment & Plan Note (Signed)
patient will receive long-acting GCSF   Ziextenzo prophylaxis on D3

## 2022-09-25 NOTE — Progress Notes (Signed)
Hematology/Oncology Progress note [phone] [phone]      Patient Care Team: Olin Hauser, DO as PCP - General (Family Medicine) Clent Jacks, RN as Registered Nurse Earlie Server, MD as Consulting Physician (Hematology and Oncology) Cammie Sickle, MD as Consulting Physician (Oncology)  ASSESSMENT & PLAN:   Cancer Staging  Rectal cancer [facility]) Staging form: Colon and Rectum, AJCC 8th Edition - Pathologic stage from 10/06/2019: Stage IIC (ypT4b, pN0, cM0) - Signed by Earlie Server, MD on 10/06/2019 - Clinical stage from 06/30/2020: [name] - Signed by Earlie Server, MD on 04/17/2022 - Pathologic: Stage Unknown (rpTX, pNX, cM1) - Signed by Earlie Server, MD on 01/31/2021   Rectal cancer [facility]) History of stage IIIC Rectal cancer, s/p TNT, followed by 09/17/19 APR/posterior vaginectomy/TAH/BSO/VY-flap, pT4b pN0 with close vaginal margin 0.2 mm.  Uterus and ovaries negative for malignancy. palliative radiation to vaginal recurrence- 01/19/21 recurrence with lung metastasis.-Palliative -FOLFIRI plus bevacizumab.  Irinotecan was dropped in November 2022 due to side effects. Negative for UGT1A1*28 - radiographically stable, rise of CEA-July 2023 CT showed slightly progression of lung metastasis. Labs are reviewed and discussed with patient. CEA progressively increases Proceed with dose reduced FOLFIRI-[Irinotecan 100 mg/m2] / bevacizumab- PET showed progression in pelvic lymph nodes and bone lesions, [RAS wild type. Consider switch to irinotecan +panitumumab upon significant radiographic progression]  #posterior cortical irregularity and heterogeneity in the left iliac bone, can no r/o mets.  she has mild left hip pain.   Encounter for antineoplastic chemotherapy Chemotherapy plan as listed above.   Anemia due to antineoplastic chemotherapy Hb is stable. observation  Chemotherapy induced neutropenia (HCC) patient will receive long-acting GCSF   Ziextenzo  prophylaxis on D3  History of pulmonary embolism Continue  Eliquis 2.5 mg twice daily for anticoagulation prophylaxis.   Follow up in 2 weeks. Lab MD next cycle of chemo. All questions were answered. The patient knows to call the clinic with any problems, questions or concerns.  Earlie Server, MD, PhD [facility] Hematology Oncology 09/25/2022      CHIEF COMPLAINTS/REASON FOR VISIT:  Follow up for rectal cancer  HISTORY OF PRESENTING ILLNESS:  Patient initially presented with complaints of postmenopausal bleeding on 08/16/2018.  History of was menopausal vaginal bleeding in 2016 which resulted in cervical polypectomy.  Pathology 02/04/2015 showed cervical polyp, consistent with benign endometrial polyp.  [name] after polypectomy due to anxiety associated with pelvic exams.  pelvic exam on 08/16/2018 reviewed cervical abnormality and from enlarged uterus. Seen by Dr. Marcelline Mates on 10/29/2018.  Endometrial biopsy and a Pap smear was performed. 10/29/2018 Pap smear showed adenocarcinoma, favor endometrial origin. 10/29/2018 endometrial biopsy showed endometrioid carcinoma, FIGO grade 1.  10/29/2018- TA & TV Ultrasound revealed: Anteverted uterus measuring 8.7 x 5.6 x 6.4 cm without evidence of focal masses.  The endometrium measuring 24.1 mm (thickened) and heterogeneous.  Right and left ovaries not visualized.  No adnexal masses identified.  No free fluid in cul-de-sac.  Patient was seen by Dr. Theora Gianotti in clinic on 11/13/2018.  Cervical exam reveals 2 cm exophytic irregular mass consistent with malignancy.   11/19/2018 CT chest abdomen pelvis with contrast showed thickened endometrium with some irregularity compatible with the provided diagnosis of endometrial malignancy.  There is a mildly prominent left inguinal node 1.4 cm.  Patient was seen by Dr. Fransisca Connors on 11/20/2018 and left groin lymph node biopsy was recommended.  11/26/2018 patient underwent left inguinal lymph node  biopsy. Pathology showed metastatic adenocarcinoma consistent with  colorectal origin.  CDX 2+.  Case was discussed on tumor board.  Recommend colonoscopy for further evaluation.  Patient reports significant weight loss 30 pounds over the last year.  Chronic vaginal spotting. Change of bowel habits the past few months.  More constipated.  Family history positive for brother who has colon cancer prostate cancer.  patient has underwent colonoscopy on 12/03/2018 which reviewed a nonobstructing large mass in the rectum.  Also chronic fistula.  Mass was not circumferential.  This was biopsied with a cold forceps for histology.  Pathology came back hyperplastic polyp negative for dysplasia and malignancy. [name], patient underwent flex sigmoidoscopy on 12/06/2018 with rebiopsy of the rectal mass. This time biopsy results came back positive for invasive colorectal adenocarcinoma, moderately differentiated. Immunotherapy for nearly mismatch repair protein (MMR ) was performed.  There is no loss of MMR expression.  low probability of MSI high.   # Seen by [name] for evaluation of resectability for rectal cancer. In addition, she also had a second opinion with [facility] where her endometrial biopsy pathology was changed to  adenocarcinoma, consistent with colorectal primary.   [name]. She has home health that has been assisting with ostomy care  Patient was also evaluated by [facility] oncology.  Recommendation is to proceed with TNT with concurrent chemoradiation followed by neoadjuvant chemotherapy followed by surgical resection. Patient prefers to have [facility].   # Oncology Treatment:  02/03/2019- 03/19/2019  concurrent Xeloda and radiation.  Xeloda dose 819m /m2 BID - rounded to 16551mBID- on days of radiation. 04/09/2019, started on FOLFOX with bolus early.  Omitted.  07/16/2019 finished 8 cycles of  FOLFOX. 09/17/19 APR/posterior vaginectomy/TAH/BSO/VY-flap pT4b pN0 with close vaginal margin 0.2 mm.  Uterus and ovaries negative for malignancy. Patient reports bilateral lower extremity numbness and tingling, intermittent, left worse than right. She has lost a lot of weight since her APR surgery.   #Family history with half brother having's history of colon cancer prostate cancer.  Personal history of colorectal cancer.  Patient has not decided if she wants genetic testing.    # history of PE( 01/13/2020)  in the bilateral lower extremity DVT (01/13/2020).   She finishes 6 months of anticoagulation with Eliquis 5 mg twice daily. Now switched to Eliquis 2.5 mg twice daily..  # She has now developed recurrent disease. #06/30/20  vaginal introitus mass biopsied. Pathology is consistent with metastatic colorectal adenocarcinoma I have discussed with [name]  Dr. MaHester Matesnd the mass is not resectable. Patient has also had colonoscopy by Dr. AnVicente Malesesterday. Normal examination. # 07/16/2020 cycle 1 FOLFIRI  # 07/20/2020 PET scan was done for further evaluation, images are consistent with local recurrence, no distant metastasis. #Discussed with radiation oncology Dr. ChBaruch Goutyill recommends concurrent chemotherapy and radiation. 08/02/2020-08/16/2020, patient starts radiation.  Xeloda was held due to neutropenia 08/17/2020,-09/06/2020 Xeloda 1500 mg twice daily concurrently with radiation  01/31/21 started on FOLFIRI + Bev 05/18/2021 CT chest abdomen pelvis showed Previously noted enlargement of bilateral inguinal lymph nodes is resolved, consistent with treatment response of nodal metastatic disease. Interval decrease in size of multiple small bilateral pulmonary nodules, consistent with treatment response of pulmonary metastatic disease. No evidence of new metastatic disease. 05/24/2021 - 08/30/2021, continued on FOLFIRI plus bevacizumab.  Irinotecan dose was reduced, eventually 10057m2  09/02/2021,  CT chest abdomen pelvis without contrast Showed small bilateral pulmonary nodules, unchanged.  Stable metastatic disease.  No  noncontrast evidence of new metastatic disease in the chest abdomen pelvis.  Small parastomal hernia.  Enlargement of main pulmonary artery.  Coronary artery disease.  09/13/2021, maintenance 5-FU/bevacizumab 11/28/2021, 5-FU/Irinotecan/bevacizumab.  Irinotecan 100 mg/m2 was added back due to progressively increasing CEA.  INTERVAL HISTORY Jacqueline Yoder is a 76 y.o. female who has above history reviewed by me presents for follow-up of rectal cancer. Currently on dose reduced FOLFIRI/bevacizumab  she tolerates well. No significant increase of ostomy output, no nausea vomiting + left hip arthritis pain. She takes Tylenol PRN  + Left maxillary sinus pain, no fever.  She has some nasal discharge, clear color.  Nonbloody or yellowish.  No fever or chills. Weight remains stable comparing to last visit.     Review of Systems  Constitutional:  Positive for fatigue. Negative for appetite change, chills, fever and unexpected weight change.  HENT:   Negative for hearing loss and voice change.   Eyes:  Negative for eye problems.  Respiratory:  Negative for chest tightness and cough.   Cardiovascular:  Negative for chest pain.  Gastrointestinal:  Negative for abdominal distention, abdominal pain, blood in stool, constipation, diarrhea and nausea.  Endocrine: Negative for hot flashes.  Genitourinary:  Negative for difficulty urinating and frequency.   Musculoskeletal:  Positive for arthralgias.  Skin:  Negative for itching and rash.  Neurological:  Negative for extremity weakness and numbness.  Hematological:  Negative for adenopathy.  Psychiatric/Behavioral:  Negative for confusion.     MEDICAL HISTORY:  Past Medical History:  Diagnosis Date   Allergy    Arthritis    Blood clot in vein    Family history of colon cancer    GERD (gastroesophageal reflux disease)     Hypercholesteremia    Hypertension    Hypertension    Lower extremity edema    Personal history of chemotherapy    Rectal cancer ([place]) 12/2018   Urinary incontinence     SURGICAL HISTORY: Past Surgical History:  Procedure Laterality Date   ABDOMINAL HYSTERECTOMY     CHOLECYSTECTOMY  1971   COLONOSCOPY WITH PROPOFOL N/A 12/03/2018   Procedure: COLONOSCOPY WITH PROPOFOL;  Surgeon: Lucilla Lame, MD;  Location: [facility] ENDOSCOPY;  Service: Endoscopy;  Laterality: N/A;   COLONOSCOPY WITH PROPOFOL N/A 07/15/2020   Procedure: COLONOSCOPY WITH PROPOFOL;  Surgeon: [name], MD;  Location: [facility] ENDOSCOPY;  Service: Gastroenterology;  Laterality: N/A;   FLEXIBLE SIGMOIDOSCOPY N/A 12/06/2018   Procedure: FLEXIBLE SIGMOIDOSCOPY;  Surgeon: [name], MD;  Location: [facility] ENDOSCOPY;  Service: Endoscopy;  Laterality: N/A;   LAPAROSCOPIC COLOSTOMY  01/06/2019   PORTACATH PLACEMENT N/A 04/03/2019   Procedure: INSERTION PORT-A-CATH;  Surgeon: Jules Husbands, MD;  Location: [facility] ORS;  Service: General;  Laterality: N/A;    SOCIAL HISTORY: Social History   Socioeconomic History   Marital status: Widowed    Spouse name: Not on file   Number of children: Not on file   Years of education: Not on file   Highest education level: Not on file  Occupational History   Not on file  Tobacco Use   Smoking status: Former    Types: Cigarettes    Quit date: 12/02/1977    Years since quitting: 44.8   Smokeless tobacco: Former  Scientific laboratory technician Use: Never used  Substance and Sexual Activity   Alcohol use: Never   Drug use: Never   Sexual activity: Not Currently    Birth control/protection: None  Other Topics Concern   Not  on file  Social History Narrative   Lives with daughter   Social Determinants of Health   Financial Resource Strain: Medium Risk (06/30/2022)   Overall Financial Resource Strain (CARDIA)    Difficulty of Paying Living Expenses: Somewhat hard  Food Insecurity: No Food Insecurity  (06/30/2022)   Hunger Vital Sign    Worried About Running Out of Food in the Last Year: Never true    Ran Out of Food in the Last Year: Never true  Transportation Needs: No Transportation Needs (06/30/2022)   PRAPARE - Hydrologist (Medical): No    Lack of Transportation (Non-Medical): No  Physical Activity: Inactive (06/30/2022)   Exercise Vital Sign    Days of Exercise per Week: 0 days    Minutes of Exercise per Session: 0 min  Stress: No Stress Concern Present (06/30/2022)   [place]    Feeling of Stress : Not at all  Social Connections: Moderately Isolated (06/30/2022)   Social Connection and Isolation Panel [NHANES]    Frequency of Communication with Friends and Family: More than three times a week    Frequency of Social Gatherings with Friends and Family: Twice a week    Attends Religious Services: More than 4 times per year    Active Member of [employer] or Organizations: No    Attends Archivist Meetings: Never    Marital Status: Widowed  Intimate Partner Violence: Not At Risk (06/30/2022)   Humiliation, Afraid, Rape, and Kick questionnaire    Fear of Current or Ex-Partner: No    Emotionally Abused: No    Physically Abused: No    Sexually Abused: No    FAMILY HISTORY: Family History  Problem Relation Age of Onset   Colon cancer Brother 100       exposure to chemicals [place]   Hypertension Mother    Stroke Mother    Kidney failure Father    Breast cancer Neg Hx    Ovarian cancer Neg Hx     ALLERGIES:  is allergic to sulfamethoxazole-trimethoprim.  MEDICATIONS:  Current Outpatient Medications  Medication Sig Dispense Refill   amoxicillin-clavulanate (AUGMENTIN) 875-125 MG tablet Take 1 tablet by mouth 2 (two) times daily. 10 tablet 0   benzonatate (TESSALON) 200 MG capsule Take 1 capsule (200 mg total) by mouth 3 (three) times daily as needed for cough. 30 capsule 0    Cholecalciferol (VITAMIN D3) 2000 units capsule Take 2,000 Units by mouth daily.     cholestyramine (QUESTRAN) 4 g packet Take 1 packet (4 g total) by mouth 3 (three) times daily. 90 each 1   diclofenac sodium (VOLTAREN) 1 % GEL Apply 2 g topically 4 (four) times daily as needed (joint pain).  11   diphenoxylate-atropine (LOMOTIL) 2.5-0.025 MG tablet Take 1 tablet by mouth 4 (four) times daily as needed for diarrhea or loose stools. 30 tablet 1   ELIQUIS 2.5 MG TABS tablet TAKE 1 TABLET BY MOUTH TWICE  DAILY 60 tablet 11   fluticasone (FLONASE) 50 MCG/ACT nasal spray USE 1 SPRAY IN EACH NOSTRIL ONCE DAILY 16 g 3   gabapentin (NEURONTIN) 100 MG capsule Take 1 capsule (100 mg total) by mouth at bedtime. 30 capsule 0   lidocaine-prilocaine (EMLA) cream Apply 1 application. topically as needed. 30 g 6   loperamide (IMODIUM) 2 MG capsule Take 1 capsule (2 mg total) by mouth See admin instructions. With onset of loose stool, take 50m  followed by 51m every 2 hours,  Maximum: 16 mg/day 120 capsule 1   loratadine (CLARITIN) 10 MG tablet Take 10 mg by mouth daily.     Multiple Vitamins-Minerals (ONE-A-DAY WOMENS 50 PLUS PO) Take 1 tablet by mouth daily.      potassium chloride SA (KLOR-CON M) 20 MEQ tablet Take 1 tablet (20 mEq total) by mouth daily. 90 tablet 0   simvastatin (ZOCOR) 40 MG tablet Take 1 tablet (40 mg total) by mouth at bedtime. 100 tablet 3   triamterene-hydrochlorothiazide (DYAZIDE) 37.5-25 MG capsule Take 1 each (1 capsule total) by mouth daily. 100 capsule 3   zinc gluconate 50 MG tablet Take 50 mg by mouth daily.     No current facility-administered medications for this visit.   Facility-Administered Medications Ordered in Other Visits  Medication Dose Route Frequency Provider Last Rate Last Admin   acetaminophen (TYLENOL) tablet 650 mg  650 mg Oral Q6H PRN YEarlie Server MD   650 mg at 09/25/22 06387  atropine injection 0.5 mg  0.5 mg Intravenous Once PRN YEarlie Server MD        bevacizumab-awwb (MVASI) 350 mg in sodium chloride 0.9 % 100 mL chemo infusion  5 mg/kg (Treatment Plan Recorded) Intravenous Once YEarlie Server MD       diphenhydrAMINE (BENADRYL) injection 25 mg  25 mg Intravenous Daily PRN YEarlie Server MD   25 mg at 06/26/22 1405   diphenhydrAMINE (BENADRYL) injection 25 mg  25 mg Intravenous Daily PRN YEarlie Server MD       fluorouracil (ADRUCIL) 4,300 mg in sodium chloride 0.9 % 64 mL chemo infusion  2,400 mg/m2 (Treatment Plan Recorded) Intravenous 1 day or 1 dose YEarlie Server MD       irinotecan (CAMPTOSAR) 180 mg in sodium chloride 0.9 % 500 mL chemo infusion  100 mg/m2 (Treatment Plan Recorded) Intravenous Once YEarlie Server MD       leucovorin 750 mg in sodium chloride 0.9 % 250 mL infusion  750 mg Intravenous Once YEarlie Server MD         PHYSICAL EXAMINATION: ECOG PERFORMANCE STATUS: 1 - Symptomatic but completely ambulatory  Physical Exam Constitutional:      General: She is not in acute distress. HENT:     Head: Normocephalic and atraumatic.  Eyes:     General: No scleral icterus. Cardiovascular:     Rate and Rhythm: Normal rate and regular rhythm.     Heart sounds: Normal heart sounds.  Pulmonary:     Effort: Pulmonary effort is normal. No respiratory distress.     Breath sounds: No wheezing.  Abdominal:     General: Bowel sounds are normal. There is no distension.     Palpations: Abdomen is soft.     Comments: + Colostomy bag   Musculoskeletal:        General: No deformity. Normal range of motion.     Cervical back: Normal range of motion and neck supple.  Skin:    General: Skin is warm and dry.     Findings: No erythema or rash.  Neurological:     Mental Status: She is alert and oriented to person, place, and time. Mental status is at baseline.     Cranial Nerves: No cranial nerve deficit.     Coordination: Coordination normal.       LABORATORY DATA:  I have reviewed the data as listed    Latest Ref Rng & Units 09/25/2022    8:47 AM  09/11/2022    8:39 AM 08/28/2022    8:53 AM  CBC  WBC 4.0 - 10.5 K/uL 5.7  5.3  5.9   Hemoglobin 12.0 - 15.0 g/dL 12.2  12.1  11.6   Hematocrit 36.0 - 46.0 % 39.2  37.9  37.4   Platelets 150 - 400 K/uL 228  204  229       Latest Ref Rng & Units 09/25/2022    8:47 AM 09/11/2022    8:39 AM 08/28/2022    8:53 AM  CMP  Glucose 70 - 99 mg/dL 95  95  100   BUN 8 - 23 mg/dL _0 Creatinine 0.44 - 1.00 mg/dL 0.84  0.83  0.93   Sodium 135 - 145 mmol/L 138  135  138   Potassium 3.5 - 5.1 mmol/L 4.0  3.6  4.1   Chloride 98 - 111 mmol/L 102  101  104   CO2 22 - 32 mmol/L _1 Calcium 8.9 - 10.3 mg/dL 9.1  9.3  9.2   Total Protein 6.5 - 8.1 g/dL 6.9  7.1  6.7   Total Bilirubin 0.3 - 1.2 mg/dL 0.5  0.5  0.4   Alkaline Phos 38 - 126 U/L 95  74  68   AST 15 - 41 U/L 33  29  28   ALT 0 - 44 U/L 32  16  14      RADIOGRAPHIC STUDIES: I have personally reviewed the radiological images as listed and agreed with the findings in the report. NM PET Image Restag (PS) Skull Base To Thigh  Result Date: 09/20/2022 CLINICAL DATA:  Subsequent treatment strategy for rectal cancer staging in a 76 year old female. EXAM: NUCLEAR MEDICINE PET SKULL BASE TO THIGH TECHNIQUE: 8.74 mCi F-18 FDG was injected intravenously. Full-ring PET imaging was performed from the skull base to thigh after the radiotracer. CT data was obtained and used for attenuation correction and anatomic localization. Fasting blood glucose: 82 mg/dl COMPARISON:  July 17, 2022 FINDINGS: Mediastinal blood pool activity: SUV max 1.55 Liver activity: SUV max NA NECK: No hypermetabolic lymph nodes in the neck. Incidental CT findings: None. CHEST: Scattered small pulmonary nodules largest in the medial RIGHT middle lobe (image 97/2) approximately 7 mm visible FDG uptake in this area is subtle. This dominant nodule is diminished in size compared to more remote imaging from January 19, 2021. These areas accounting for respiratory motion on  the current study are not substantially changed compared to recent CT imaging. No adenopathy in the chest. Incidental CT findings: RIGHT Port-A-Cath terminates at the caval to atrial junction. No pericardial effusion. Otherwise unremarkable appearance of the heart great vessels on noncontrast imaging. Lungs are clear and airways are patent aside from pulmonary nodules discussed above. ABDOMEN/PELVIS: RIGHT groin lymph nodes (image 203/2 11 mm RIGHT inguinal lymph node with a maximum SUV of 5.13. Adjacent smaller lymph nodes with slightly less activity. RIGHT external iliac lymph node (image 196/2) 7 mm short axis with a maximum SUV of 7.71. Incidental CT findings: No acute findings relative to liver, spleen, pancreas, adrenal glands, kidneys, stomach, small or large bowel following APR. LEFT lower quadrant colostomy as on previous imaging. Aortic atherosclerosis. SKELETON: Diffuse marrow space uptake of FDG. Subtle lucency in the RIGHT humeral head as compared to the LEFT humeral head with dramatic differential FDG uptake, more focal appearing on the RIGHT with a maximum SUV of 9.65, uptake in the marrow  space of the LEFT proximal humerus is 4.80. Area of abnormality with cortical irregularity of the LEFT posterior iliac showing a maximum SUV of 4.70 similar lucent and sclerotic appearance. Relative lack of uptake in pelvic marrow spaces following radiotherapy. Incidental CT findings: Spinal degenerative changes. IMPRESSION: 1. Signs of nodal disease in the RIGHT inguinal region and RIGHT pelvis as discussed. 2. Scattered pulmonary nodules indicative of metastatic disease with limited FDG uptake and no substantial change compared to more recent imaging. 3. No signs of solid organ disease. 4. Diffuse marrow space activity could be seen in the setting of marrow stimulation, this confounds further assessment of areas discussed above in the RIGHT proximal humerus and LEFT iliac bone in particular MRI of the RIGHT  proximal humerus could be performed particularly if there are new symptoms in this location to assess for any focal underlying signs of bony metastatic disease. Morphologic changes and mild increased metabolic activity on the LEFT as compared to the RIGHT within the iliac crest remains suspicious for metastatic disease. 5. Postoperative changes of APR and LEFT lower quadrant colostomy. 6. Aortic atherosclerosis. Aortic Atherosclerosis (ICD10-I70.0). Electronically Signed   By: Zetta Bills M.D.   On: 09/20/2022 14:46   CT CHEST ABDOMEN PELVIS W CONTRAST  Result Date: 07/18/2022 CLINICAL DATA:  Metastatic rectal adenocarcinoma. Ongoing chemotherapy. * Tracking Code: BO * EXAM: CT CHEST, ABDOMEN, AND PELVIS WITH CONTRAST TECHNIQUE: Multidetector CT imaging of the chest, abdomen and pelvis was performed following the standard protocol during bolus administration of intravenous contrast. RADIATION DOSE REDUCTION: This exam was performed according to the departmental dose-optimization program which includes automated exposure control, adjustment of the mA and/or kV according to patient size and/or use of iterative reconstruction technique. CONTRAST:  30m OMNIPAQUE IOHEXOL 300 MG/ML  SOLN COMPARISON:  04/26/2022 FINDINGS: CT CHEST FINDINGS Cardiovascular: Coronary, aortic arch, and branch vessel atherosclerotic vascular disease. Right Port-A-Cath tip: Cavoatrial junction. Mediastinum/Nodes: Unremarkable Lungs/Pleura: Generally stable small scattered bilateral pulmonary nodules. One of the largest is a bilobed 8 by 6 mm right middle lobe nodule on image 106 series 3. No new nodules identified. Musculoskeletal: Degenerative glenohumeral arthropathy noted. Whole-body bone scan rib revealed accentuated activity in the right humeral head, on today's CT I do not see a specific cause for this accentuated activity, although MRI might be more sensitive. Thoracic spondylosis noted. CT ABDOMEN PELVIS FINDINGS Hepatobiliary:  Gallbladder absent. Stable mild intrahepatic biliary dilatation, possibly a physiologic response to cholecystectomy. No significant focal liver lesion. Pancreas: Unremarkable Spleen: Unremarkable Adrenals/Urinary Tract: Stable appearance of bilateral benign-appearing renal cysts which do not warrant individual follow up. Adrenal glands unremarkable. Stomach/Bowel: Descending colostomy. There some small diverticula of the descending colon without findings of active diverticulitis. Pelvic floor laxity with loops of small bowel extending 3 cm below the pubococcygeal line. Vascular/Lymphatic: Atherosclerosis is present, including aortoiliac atherosclerotic disease. Atherosclerotic plaque at the origin of the celiac trunk and proximally in the SMA, causing stenosis in the proximal SMA no compelling findings of occlusion of the superior mesenteric artery. No pathologic adenopathy is identified. Reproductive: Uterus absent.  Adnexa unremarkable. Other: No supplemental non-categorized findings. Musculoskeletal: Pelvic floor laxity. Subtle chronic posterior cortical irregularity and heterogeneity in the left iliac bone on image 82 of series 2, not appreciable on the bone scan of 07/14/2022, this has been present over the past year and could reflect radiation therapy related findings although strictly speaking, malignant involvement of the left iliac bone is difficult to exclude. Lumbar spondylosis and degenerative disc disease with mild  resulting left foraminal stenosis at L4-5. IMPRESSION: 1. Stable scattered small bilateral pulmonary nodules, largest 8 by 6 mm in the right middle lobe. 2. Subtle chronically stable posterior cortical irregularity and heterogeneity in the left iliac bone, not appreciable on the bone scan of 07/14/2022. This has been present at least over the past year and could reflect radiation therapy related findings although strictly speaking, malignant involvement of the left iliac bone is difficult to  exclude. If clinically warranted this could be further assessed with nuclear medicine PET-CT or MRI of the bony pelvis with and without contrast. 3. Pelvic floor laxity with loops of small bowel extending 3 cm below the pubococcygeal line. 4. Aortic and systemic atherosclerosis as detailed above. Aortic Atherosclerosis (ICD10-I70.0). Electronically Signed   By: Van Clines M.D.   On: 07/18/2022 13:32   NM Bone Scan Whole Body  Result Date: 07/14/2022 CLINICAL DATA:  Metastatic carcinoma. Hip pain. Metastatic adenocarcinoma. Rectal carcinoma EXAM: NUCLEAR MEDICINE WHOLE BODY BONE SCAN TECHNIQUE: Whole body anterior and posterior images were obtained approximately 3 hours after intravenous injection of radiopharmaceutical. RADIOPHARMACEUTICALS:  21.9 mCi Technetium-25mMDP IV COMPARISON:  Hip radiograph 06/28/2022, CT chest abdomen pelvis 03/27/2022 FINDINGS: No abnormal radiotracer accumulation within the LEFT or RIGHT hip. No abnormal accumulation within the pelvis or spine. Intense uptake within the RIGHT humeral head mild uptake in the LEFT humeral head. No lesion identified in the RIGHT humeral head on CT 04/26/2022 (partially imaged). Additionally there was mild radiotracer activity associated with the glenohumeral joint on the RIGHT on FDG PET-CT scan 07/20/2020. This combination of findings favors degenerative uptake in the RIGHT humeral head. IMPRESSION: 1. No evidence of skeletal metastatic disease in the hips. 2. Asymmetric increased activity in the RIGHT humeral head is favored degenerative as described above. If pain in the RIGHT shoulder consider further evaluation with plain film or MRI. Electronically Signed   By: SSuzy BouchardM.D.   On: 07/14/2022 12:59   DG HIP UNILAT W OR W/O PELVIS 2-3 VIEWS LEFT  Result Date: 06/30/2022 CLINICAL DATA:  Left hip pain. EXAM: DG HIP (WITH OR WITHOUT PELVIS) 2-3V LEFT COMPARISON:  None Available. FINDINGS: Both hips are normally located. Mild  degenerative changes. No acute bony findings and no plain film evidence of AVN. The pubic symphysis and SI joints are intact. No pelvic fractures or bone lesions. IMPRESSION: Mild degenerative changes but no acute bony findings. Electronically Signed   By: PMarijo SanesM.D.   On: 06/30/2022 09:07      NM PET Image Restag (PS) Skull Base To Thigh  Result Date: 09/20/2022 CLINICAL DATA:  Subsequent treatment strategy for rectal cancer staging in a 76year old female. EXAM: NUCLEAR MEDICINE PET SKULL BASE TO THIGH TECHNIQUE: 8.74 mCi F-18 FDG was injected intravenously. Full-ring PET imaging was performed from the skull base to thigh after the radiotracer. CT data was obtained and used for attenuation correction and anatomic localization. Fasting blood glucose: 82 mg/dl COMPARISON:  July 17, 2022 FINDINGS: Mediastinal blood pool activity: SUV max 1.55 Liver activity: SUV max NA NECK: No hypermetabolic lymph nodes in the neck. Incidental CT findings: None. CHEST: Scattered small pulmonary nodules largest in the medial RIGHT middle lobe (image 97/2) approximately 7 mm visible FDG uptake in this area is subtle. This dominant nodule is diminished in size compared to more remote imaging from January 19, 2021. These areas accounting for respiratory motion on the current study are not substantially changed compared to recent CT imaging. No adenopathy in  the chest. Incidental CT findings: RIGHT Port-A-Cath terminates at the caval to atrial junction. No pericardial effusion. Otherwise unremarkable appearance of the heart great vessels on noncontrast imaging. Lungs are clear and airways are patent aside from pulmonary nodules discussed above. ABDOMEN/PELVIS: RIGHT groin lymph nodes (image 203/2 11 mm RIGHT inguinal lymph node with a maximum SUV of 5.13. Adjacent smaller lymph nodes with slightly less activity. RIGHT external iliac lymph node (image 196/2) 7 mm short axis with a maximum SUV of 7.71. Incidental CT findings:  No acute findings relative to liver, spleen, pancreas, adrenal glands, kidneys, stomach, small or large bowel following APR. LEFT lower quadrant colostomy as on previous imaging. Aortic atherosclerosis. SKELETON: Diffuse marrow space uptake of FDG. Subtle lucency in the RIGHT humeral head as compared to the LEFT humeral head with dramatic differential FDG uptake, more focal appearing on the RIGHT with a maximum SUV of 9.65, uptake in the marrow space of the LEFT proximal humerus is 4.80. Area of abnormality with cortical irregularity of the LEFT posterior iliac showing a maximum SUV of 4.70 similar lucent and sclerotic appearance. Relative lack of uptake in pelvic marrow spaces following radiotherapy. Incidental CT findings: Spinal degenerative changes. IMPRESSION: 1. Signs of nodal disease in the RIGHT inguinal region and RIGHT pelvis as discussed. 2. Scattered pulmonary nodules indicative of metastatic disease with limited FDG uptake and no substantial change compared to more recent imaging. 3. No signs of solid organ disease. 4. Diffuse marrow space activity could be seen in the setting of marrow stimulation, this confounds further assessment of areas discussed above in the RIGHT proximal humerus and LEFT iliac bone in particular MRI of the RIGHT proximal humerus could be performed particularly if there are new symptoms in this location to assess for any focal underlying signs of bony metastatic disease. Morphologic changes and mild increased metabolic activity on the LEFT as compared to the RIGHT within the iliac crest remains suspicious for metastatic disease. 5. Postoperative changes of APR and LEFT lower quadrant colostomy. 6. Aortic atherosclerosis. Aortic Atherosclerosis (ICD10-I70.0). Electronically Signed   By: Zetta Bills M.D.   On: 09/20/2022 14:46   CT CHEST ABDOMEN PELVIS W CONTRAST  Result Date: 07/18/2022 CLINICAL DATA:  Metastatic rectal adenocarcinoma. Ongoing chemotherapy. * Tracking Code:  BO * EXAM: CT CHEST, ABDOMEN, AND PELVIS WITH CONTRAST TECHNIQUE: Multidetector CT imaging of the chest, abdomen and pelvis was performed following the standard protocol during bolus administration of intravenous contrast. RADIATION DOSE REDUCTION: This exam was performed according to the departmental dose-optimization program which includes automated exposure control, adjustment of the mA and/or kV according to patient size and/or use of iterative reconstruction technique. CONTRAST:  49m OMNIPAQUE IOHEXOL 300 MG/ML  SOLN COMPARISON:  04/26/2022 FINDINGS: CT CHEST FINDINGS Cardiovascular: Coronary, aortic arch, and branch vessel atherosclerotic vascular disease. Right Port-A-Cath tip: Cavoatrial junction. Mediastinum/Nodes: Unremarkable Lungs/Pleura: Generally stable small scattered bilateral pulmonary nodules. One of the largest is a bilobed 8 by 6 mm right middle lobe nodule on image 106 series 3. No new nodules identified. Musculoskeletal: Degenerative glenohumeral arthropathy noted. Whole-body bone scan rib revealed accentuated activity in the right humeral head, on today's CT I do not see a specific cause for this accentuated activity, although MRI might be more sensitive. Thoracic spondylosis noted. CT ABDOMEN PELVIS FINDINGS Hepatobiliary: Gallbladder absent. Stable mild intrahepatic biliary dilatation, possibly a physiologic response to cholecystectomy. No significant focal liver lesion. Pancreas: Unremarkable Spleen: Unremarkable Adrenals/Urinary Tract: Stable appearance of bilateral benign-appearing renal cysts which do not warrant  individual follow up. Adrenal glands unremarkable. Stomach/Bowel: Descending colostomy. There some small diverticula of the descending colon without findings of active diverticulitis. Pelvic floor laxity with loops of small bowel extending 3 cm below the pubococcygeal line. Vascular/Lymphatic: Atherosclerosis is present, including aortoiliac atherosclerotic disease.  Atherosclerotic plaque at the origin of the celiac trunk and proximally in the SMA, causing stenosis in the proximal SMA no compelling findings of occlusion of the superior mesenteric artery. No pathologic adenopathy is identified. Reproductive: Uterus absent.  Adnexa unremarkable. Other: No supplemental non-categorized findings. Musculoskeletal: Pelvic floor laxity. Subtle chronic posterior cortical irregularity and heterogeneity in the left iliac bone on image 82 of series 2, not appreciable on the bone scan of 07/14/2022, this has been present over the past year and could reflect radiation therapy related findings although strictly speaking, malignant involvement of the left iliac bone is difficult to exclude. Lumbar spondylosis and degenerative disc disease with mild resulting left foraminal stenosis at L4-5. IMPRESSION: 1. Stable scattered small bilateral pulmonary nodules, largest 8 by 6 mm in the right middle lobe. 2. Subtle chronically stable posterior cortical irregularity and heterogeneity in the left iliac bone, not appreciable on the bone scan of 07/14/2022. This has been present at least over the past year and could reflect radiation therapy related findings although strictly speaking, malignant involvement of the left iliac bone is difficult to exclude. If clinically warranted this could be further assessed with nuclear medicine PET-CT or MRI of the bony pelvis with and without contrast. 3. Pelvic floor laxity with loops of small bowel extending 3 cm below the pubococcygeal line. 4. Aortic and systemic atherosclerosis as detailed above. Aortic Atherosclerosis (ICD10-I70.0). Electronically Signed   By: Van Clines M.D.   On: 07/18/2022 13:32   NM Bone Scan Whole Body  Result Date: 07/14/2022 CLINICAL DATA:  Metastatic carcinoma. Hip pain. Metastatic adenocarcinoma. Rectal carcinoma EXAM: NUCLEAR MEDICINE WHOLE BODY BONE SCAN TECHNIQUE: Whole body anterior and posterior images were obtained  approximately 3 hours after intravenous injection of radiopharmaceutical. RADIOPHARMACEUTICALS:  21.9 mCi Technetium-62mMDP IV COMPARISON:  Hip radiograph 06/28/2022, CT chest abdomen pelvis 03/27/2022 FINDINGS: No abnormal radiotracer accumulation within the LEFT or RIGHT hip. No abnormal accumulation within the pelvis or spine. Intense uptake within the RIGHT humeral head mild uptake in the LEFT humeral head. No lesion identified in the RIGHT humeral head on CT 04/26/2022 (partially imaged). Additionally there was mild radiotracer activity associated with the glenohumeral joint on the RIGHT on FDG PET-CT scan 07/20/2020. This combination of findings favors degenerative uptake in the RIGHT humeral head. IMPRESSION: 1. No evidence of skeletal metastatic disease in the hips. 2. Asymmetric increased activity in the RIGHT humeral head is favored degenerative as described above. If pain in the RIGHT shoulder consider further evaluation with plain film or MRI. Electronically Signed   By: SSuzy BouchardM.D.   On: 07/14/2022 12:59   DG HIP UNILAT W OR W/O PELVIS 2-3 VIEWS LEFT  Result Date: 06/30/2022 CLINICAL DATA:  Left hip pain. EXAM: DG HIP (WITH OR WITHOUT PELVIS) 2-3V LEFT COMPARISON:  None Available. FINDINGS: Both hips are normally located. Mild degenerative changes. No acute bony findings and no plain film evidence of AVN. The pubic symphysis and SI joints are intact. No pelvic fractures or bone lesions. IMPRESSION: Mild degenerative changes but no acute bony findings. Electronically Signed   By: PMarijo SanesM.D.   On: 06/30/2022 09:07

## 2022-09-25 NOTE — Assessment & Plan Note (Addendum)
History of stage IIIC Rectal cancer, s/p TNT, followed by 09/17/19 APR/posterior vaginectomy/TAH/BSO/VY-flap, pT4b pN0 with close vaginal margin 0.2 mm.  Uterus and ovaries negative for malignancy. palliative radiation to vaginal recurrence- 01/19/21 recurrence with lung metastasis.-Palliative -FOLFIRI plus bevacizumab.  Irinotecan was dropped in November 2022 due to side effects. Negative for UGT1A1*28 - radiographically stable, rise of CEA-July 2023 CT showed slightly progression of lung metastasis. Labs are reviewed and discussed with patient. CEA progressively increases Proceed with dose reduced FOLFIRI-[Irinotecan 100 mg/m2] / bevacizumab- PET showed progression in pelvic lymph nodes and bone lesions, I sent message to Dr.Chrystal to see if she may benefit from local RT [RAS wild type. Consider switch to irinotecan +panitumumab upon significant radiographic progression]

## 2022-09-27 ENCOUNTER — Inpatient Hospital Stay: Payer: Medicare Other

## 2022-09-27 VITALS — BP 134/77 | HR 69 | Resp 18

## 2022-09-27 DIAGNOSIS — Z86718 Personal history of other venous thrombosis and embolism: Secondary | ICD-10-CM | POA: Diagnosis not present

## 2022-09-27 DIAGNOSIS — Z79899 Other long term (current) drug therapy: Secondary | ICD-10-CM | POA: Diagnosis not present

## 2022-09-27 DIAGNOSIS — C799 Secondary malignant neoplasm of unspecified site: Secondary | ICD-10-CM

## 2022-09-27 DIAGNOSIS — I700 Atherosclerosis of aorta: Secondary | ICD-10-CM | POA: Diagnosis not present

## 2022-09-27 DIAGNOSIS — Z5111 Encounter for antineoplastic chemotherapy: Secondary | ICD-10-CM | POA: Diagnosis not present

## 2022-09-27 DIAGNOSIS — E7800 Pure hypercholesterolemia, unspecified: Secondary | ICD-10-CM | POA: Diagnosis not present

## 2022-09-27 DIAGNOSIS — D6481 Anemia due to antineoplastic chemotherapy: Secondary | ICD-10-CM | POA: Diagnosis not present

## 2022-09-27 DIAGNOSIS — C20 Malignant neoplasm of rectum: Secondary | ICD-10-CM

## 2022-09-27 DIAGNOSIS — T451X5A Adverse effect of antineoplastic and immunosuppressive drugs, initial encounter: Secondary | ICD-10-CM | POA: Diagnosis not present

## 2022-09-27 DIAGNOSIS — Z9221 Personal history of antineoplastic chemotherapy: Secondary | ICD-10-CM | POA: Diagnosis not present

## 2022-09-27 DIAGNOSIS — Z87891 Personal history of nicotine dependence: Secondary | ICD-10-CM | POA: Diagnosis not present

## 2022-09-27 DIAGNOSIS — C7800 Secondary malignant neoplasm of unspecified lung: Secondary | ICD-10-CM | POA: Diagnosis not present

## 2022-09-27 DIAGNOSIS — I10 Essential (primary) hypertension: Secondary | ICD-10-CM | POA: Diagnosis not present

## 2022-09-27 DIAGNOSIS — Z933 Colostomy status: Secondary | ICD-10-CM | POA: Diagnosis not present

## 2022-09-27 DIAGNOSIS — M25552 Pain in left hip: Secondary | ICD-10-CM | POA: Diagnosis not present

## 2022-09-27 DIAGNOSIS — K5900 Constipation, unspecified: Secondary | ICD-10-CM | POA: Diagnosis not present

## 2022-09-27 DIAGNOSIS — Z800 Family history of malignant neoplasm of digestive organs: Secondary | ICD-10-CM | POA: Diagnosis not present

## 2022-09-27 DIAGNOSIS — K219 Gastro-esophageal reflux disease without esophagitis: Secondary | ICD-10-CM | POA: Diagnosis not present

## 2022-09-27 DIAGNOSIS — D701 Agranulocytosis secondary to cancer chemotherapy: Secondary | ICD-10-CM | POA: Diagnosis not present

## 2022-09-27 DIAGNOSIS — R634 Abnormal weight loss: Secondary | ICD-10-CM | POA: Diagnosis not present

## 2022-09-27 DIAGNOSIS — K435 Parastomal hernia without obstruction or  gangrene: Secondary | ICD-10-CM | POA: Diagnosis not present

## 2022-09-27 DIAGNOSIS — I2510 Atherosclerotic heart disease of native coronary artery without angina pectoris: Secondary | ICD-10-CM | POA: Diagnosis not present

## 2022-09-27 DIAGNOSIS — Z86711 Personal history of pulmonary embolism: Secondary | ICD-10-CM | POA: Diagnosis not present

## 2022-09-27 DIAGNOSIS — Z7901 Long term (current) use of anticoagulants: Secondary | ICD-10-CM | POA: Diagnosis not present

## 2022-09-27 MED ORDER — SODIUM CHLORIDE 0.9% FLUSH
10.0000 mL | INTRAVENOUS | Status: DC | PRN
  Administered 2022-09-27: 10 mL
  Filled 2022-09-27: qty 10

## 2022-09-27 MED ORDER — PEGFILGRASTIM INJECTION 6 MG/0.6ML ~~LOC~~
6.0000 mg | PREFILLED_SYRINGE | Freq: Once | SUBCUTANEOUS | Status: AC
  Administered 2022-09-27: 6 mg via SUBCUTANEOUS
  Filled 2022-09-27: qty 0.6

## 2022-09-27 MED ORDER — HEPARIN SOD (PORK) LOCK FLUSH 100 UNIT/ML IV SOLN
500.0000 [IU] | Freq: Once | INTRAVENOUS | Status: AC | PRN
  Administered 2022-09-27: 500 [IU]
  Filled 2022-09-27: qty 5

## 2022-09-29 ENCOUNTER — Telehealth: Payer: Self-pay

## 2022-09-29 ENCOUNTER — Encounter: Payer: Self-pay | Admitting: Oncology

## 2022-09-29 NOTE — Progress Notes (Signed)
DISCONTINUE ON PATHWAY REGIMEN - Colorectal     A cycle is every 14 days:     Bevacizumab-xxxx      Irinotecan      Leucovorin      Fluorouracil      Fluorouracil   **Always confirm dose/schedule in your pharmacy ordering system**  REASON: Disease Progression PRIOR TREATMENT: [id number]: FOLFIRI + Bevacizumab q14 Days TREATMENT RESPONSE: Progressive Disease (PD)  START ON PATHWAY REGIMEN - Colorectal     A cycle is every 14 days:     Panitumumab      Irinotecan   **Always confirm dose/schedule in your pharmacy ordering system**  Patient Characteristics: Distant Metastases, Nonsurgical Candidate, KRAS/NRAS Wild-Type (BRAF V600 Wild-Type/Unknown), Standard Cytotoxic Therapy, Third Line Standard Cytotoxic Therapy, No Prior Anti-EGFR Therapy Tumor Location: Rectal Therapeutic Status: Distant Metastases Microsatellite/Mismatch Repair Status: MSS/pMMR BRAF Mutation Status: Wild-Type (no mutation) KRAS/NRAS Mutation Status: Wild-Type (no mutation) Preferred Therapy Approach: Standard Cytotoxic Therapy Standard Cytotoxic Line of Therapy: Third Building services engineer Cytotoxic Therapy Intent of Therapy: Non-Curative / Palliative Intent, Discussed with Patient

## 2022-09-29 NOTE — Telephone Encounter (Signed)
Dr. Tasia Catchings has discussed pt with Dr. Baruch Gouty and she recommends for pt to switch tx to irinotecan + panitumumab. Daughther [name] has been informed. Pt will keep same future appt date/time.

## 2022-09-29 NOTE — Addendum Note (Signed)
Addended by: Earlie Server on: 09/29/2022 08:37 AM   Modules accepted: Orders

## 2022-09-30 LAB — CEA: CEA: 6236 ng/mL — ABNORMAL HIGH (ref 0.0–4.7)

## 2022-10-02 DIAGNOSIS — Z933 Colostomy status: Secondary | ICD-10-CM | POA: Diagnosis not present

## 2022-10-03 ENCOUNTER — Other Ambulatory Visit: Payer: Self-pay | Admitting: Oncology

## 2022-10-06 MED FILL — Dexamethasone Sodium Phosphate Inj 100 MG/10ML: INTRAMUSCULAR | Qty: 1 | Status: AC

## 2022-10-10 ENCOUNTER — Inpatient Hospital Stay: Payer: Medicare Other

## 2022-10-10 ENCOUNTER — Encounter: Payer: Self-pay | Admitting: Oncology

## 2022-10-10 ENCOUNTER — Ambulatory Visit: Payer: Medicare Other

## 2022-10-10 ENCOUNTER — Inpatient Hospital Stay (HOSPITAL_BASED_OUTPATIENT_CLINIC_OR_DEPARTMENT_OTHER): Payer: Medicare Other | Admitting: Oncology

## 2022-10-10 VITALS — BP 142/68 | HR 84 | Temp 96.9°F | Wt 152.4 lb

## 2022-10-10 DIAGNOSIS — I10 Essential (primary) hypertension: Secondary | ICD-10-CM | POA: Diagnosis not present

## 2022-10-10 DIAGNOSIS — Z5111 Encounter for antineoplastic chemotherapy: Secondary | ICD-10-CM

## 2022-10-10 DIAGNOSIS — D701 Agranulocytosis secondary to cancer chemotherapy: Secondary | ICD-10-CM | POA: Diagnosis not present

## 2022-10-10 DIAGNOSIS — C799 Secondary malignant neoplasm of unspecified site: Secondary | ICD-10-CM

## 2022-10-10 DIAGNOSIS — C20 Malignant neoplasm of rectum: Secondary | ICD-10-CM

## 2022-10-10 DIAGNOSIS — R634 Abnormal weight loss: Secondary | ICD-10-CM | POA: Diagnosis not present

## 2022-10-10 DIAGNOSIS — D6481 Anemia due to antineoplastic chemotherapy: Secondary | ICD-10-CM

## 2022-10-10 DIAGNOSIS — Z7901 Long term (current) use of anticoagulants: Secondary | ICD-10-CM | POA: Diagnosis not present

## 2022-10-10 DIAGNOSIS — C7800 Secondary malignant neoplasm of unspecified lung: Secondary | ICD-10-CM | POA: Diagnosis not present

## 2022-10-10 DIAGNOSIS — Z86711 Personal history of pulmonary embolism: Secondary | ICD-10-CM

## 2022-10-10 DIAGNOSIS — Z933 Colostomy status: Secondary | ICD-10-CM | POA: Diagnosis not present

## 2022-10-10 DIAGNOSIS — Z79899 Other long term (current) drug therapy: Secondary | ICD-10-CM | POA: Diagnosis not present

## 2022-10-10 DIAGNOSIS — I700 Atherosclerosis of aorta: Secondary | ICD-10-CM | POA: Diagnosis not present

## 2022-10-10 DIAGNOSIS — Z800 Family history of malignant neoplasm of digestive organs: Secondary | ICD-10-CM | POA: Diagnosis not present

## 2022-10-10 DIAGNOSIS — I2510 Atherosclerotic heart disease of native coronary artery without angina pectoris: Secondary | ICD-10-CM | POA: Diagnosis not present

## 2022-10-10 DIAGNOSIS — T451X5A Adverse effect of antineoplastic and immunosuppressive drugs, initial encounter: Secondary | ICD-10-CM

## 2022-10-10 DIAGNOSIS — Z86718 Personal history of other venous thrombosis and embolism: Secondary | ICD-10-CM | POA: Diagnosis not present

## 2022-10-10 DIAGNOSIS — K219 Gastro-esophageal reflux disease without esophagitis: Secondary | ICD-10-CM | POA: Diagnosis not present

## 2022-10-10 DIAGNOSIS — Z87891 Personal history of nicotine dependence: Secondary | ICD-10-CM | POA: Diagnosis not present

## 2022-10-10 DIAGNOSIS — M25552 Pain in left hip: Secondary | ICD-10-CM | POA: Diagnosis not present

## 2022-10-10 DIAGNOSIS — K5900 Constipation, unspecified: Secondary | ICD-10-CM | POA: Diagnosis not present

## 2022-10-10 DIAGNOSIS — E7800 Pure hypercholesterolemia, unspecified: Secondary | ICD-10-CM | POA: Diagnosis not present

## 2022-10-10 DIAGNOSIS — K435 Parastomal hernia without obstruction or  gangrene: Secondary | ICD-10-CM | POA: Diagnosis not present

## 2022-10-10 DIAGNOSIS — Z9221 Personal history of antineoplastic chemotherapy: Secondary | ICD-10-CM | POA: Diagnosis not present

## 2022-10-10 LAB — COMPREHENSIVE METABOLIC PANEL
ALT: 16 U/L (ref 0–44)
AST: 29 U/L (ref 15–41)
Albumin: 4 g/dL (ref 3.5–5.0)
Alkaline Phosphatase: 78 U/L (ref 38–126)
Anion gap: 5 (ref 5–15)
BUN: 17 mg/dL (ref 8–23)
CO2: 26 mmol/L (ref 22–32)
Calcium: 9.1 mg/dL (ref 8.9–10.3)
Chloride: 104 mmol/L (ref 98–111)
Creatinine, Ser: 0.85 mg/dL (ref 0.44–1.00)
GFR, Estimated: >60 mL/min (ref >60)
Glucose, Bld: 101 mg/dL — ABNORMAL HIGH (ref 70–99)
Potassium: 3.8 mmol/L (ref 3.5–5.1)
Sodium: 135 mmol/L (ref 135–145)
Total Bilirubin: 0.4 mg/dL (ref 0.3–1.2)
Total Protein: 6.8 g/dL (ref 6.5–8.1)

## 2022-10-10 LAB — CBC WITH DIFFERENTIAL/PLATELET
Abs Immature Granulocytes: 0.04 10*3/uL (ref 0.00–0.07)
Basophils Absolute: 0 10*3/uL (ref 0.0–0.1)
Basophils Relative: 0 %
Eosinophils Absolute: 0.1 10*3/uL (ref 0.0–0.5)
Eosinophils Relative: 2 %
HCT: 37.9 % (ref 36.0–46.0)
Hemoglobin: 12 g/dL (ref 12.0–15.0)
Immature Granulocytes: 1 %
Lymphocytes Relative: 21 %
Lymphs Abs: 1.4 10*3/uL (ref 0.7–4.0)
MCH: 30.7 pg (ref 26.0–34.0)
MCHC: 31.7 g/dL (ref 30.0–36.0)
MCV: 96.9 fL (ref 80.0–100.0)
Monocytes Absolute: 0.5 10*3/uL (ref 0.1–1.0)
Monocytes Relative: 8 %
Neutro Abs: 4.5 10*3/uL (ref 1.7–7.7)
Neutrophils Relative %: 68 %
Platelets: 206 10*3/uL (ref 150–400)
RBC: 3.91 MIL/uL (ref 3.87–5.11)
RDW: 18.2 % — ABNORMAL HIGH (ref 11.5–15.5)
WBC: 6.6 10*3/uL (ref 4.0–10.5)
nRBC: 0 % (ref 0.0–0.2)

## 2022-10-10 LAB — MAGNESIUM: Magnesium: 2 mg/dL (ref 1.7–2.4)

## 2022-10-10 LAB — PROTEIN, URINE, RANDOM: Total Protein, Urine: 22 mg/dL

## 2022-10-10 MED ORDER — HEPARIN SOD (PORK) LOCK FLUSH 100 UNIT/ML IV SOLN
500.0000 [IU] | Freq: Once | INTRAVENOUS | Status: AC | PRN
  Administered 2022-10-10: 500 [IU]
  Filled 2022-10-10: qty 5

## 2022-10-10 MED ORDER — SODIUM CHLORIDE 0.9 % IV SOLN
6.0000 mg/kg | Freq: Once | INTRAVENOUS | Status: AC
  Administered 2022-10-10: 400 mg via INTRAVENOUS
  Filled 2022-10-10: qty 20

## 2022-10-10 MED ORDER — SODIUM CHLORIDE 0.9 % IV SOLN
10.0000 mg | Freq: Once | INTRAVENOUS | Status: AC
  Administered 2022-10-10: 10 mg via INTRAVENOUS
  Filled 2022-10-10: qty 10

## 2022-10-10 MED ORDER — SODIUM CHLORIDE 0.9 % IV SOLN
100.0000 mg/m2 | Freq: Once | INTRAVENOUS | Status: AC
  Administered 2022-10-10: 180 mg via INTRAVENOUS
  Filled 2022-10-10: qty 5

## 2022-10-10 MED ORDER — ATROPINE SULFATE 1 MG/ML IV SOLN
0.5000 mg | Freq: Once | INTRAVENOUS | Status: AC
  Administered 2022-10-10: 0.5 mg via INTRAVENOUS
  Filled 2022-10-10: qty 1

## 2022-10-10 MED ORDER — DIPHENHYDRAMINE HCL 50 MG/ML IJ SOLN
25.0000 mg | Freq: Once | INTRAMUSCULAR | Status: AC
  Administered 2022-10-10: 25 mg via INTRAVENOUS
  Filled 2022-10-10: qty 1

## 2022-10-10 MED ORDER — SODIUM CHLORIDE 0.9 % IV SOLN
Freq: Once | INTRAVENOUS | Status: AC
  Filled 2022-10-10: qty 250

## 2022-10-10 MED ORDER — PALONOSETRON HCL INJECTION 0.25 MG/5ML
0.2500 mg | Freq: Once | INTRAVENOUS | Status: AC
  Administered 2022-10-10: 0.25 mg via INTRAVENOUS
  Filled 2022-10-10: qty 5

## 2022-10-10 NOTE — Patient Instructions (Signed)
[facility]  Discharge Instructions: Thank you for choosing [facility] to provide your oncology and hematology care.  If you have a lab appointment with the Cancer Center, please go directly to the Cancer Center and check in at the registration area.  Wear comfortable clothing and clothing appropriate for easy access to any Portacath or PICC line.   We strive to give you quality time with your provider. You may need to reschedule your appointment if you arrive late (15 or more minutes).  Arriving late affects you and other patients whose appointments are after yours.  Also, if you miss three or more appointments without notifying the office, you may be dismissed from the clinic at the provider's discretion.      For prescription refill requests, have your pharmacy contact our office and allow 72 hours for refills to be completed.       To help prevent nausea and vomiting after your treatment, we encourage you to take your nausea medication as directed.  BELOW ARE SYMPTOMS THAT SHOULD BE REPORTED IMMEDIATELY: *FEVER GREATER THAN 100.4 F (38 C) OR HIGHER *CHILLS OR SWEATING *NAUSEA AND VOMITING THAT IS NOT CONTROLLED WITH YOUR NAUSEA MEDICATION *UNUSUAL SHORTNESS OF BREATH *UNUSUAL BRUISING OR BLEEDING *URINARY PROBLEMS (pain or burning when urinating, or frequent urination) *BOWEL PROBLEMS (unusual diarrhea, constipation, pain near the anus) TENDERNESS IN MOUTH AND THROAT WITH OR WITHOUT PRESENCE OF ULCERS (sore throat, sores in mouth, or a toothache) UNUSUAL RASH, SWELLING OR PAIN  UNUSUAL VAGINAL DISCHARGE OR ITCHING   Items with * indicate a potential emergency and should be followed up as soon as possible or go to the Emergency Department if any problems should occur.  Please show the CHEMOTHERAPY ALERT CARD or IMMUNOTHERAPY ALERT CARD at check-in to the Emergency Department and triage nurse.  Should you have questions after your  visit or need to cancel or reschedule your appointment, please contact [facility]  [phone] and follow the prompts.  Office hours are 8:00 a.m. to 4:30 p.m. Monday - Friday. Please note that voicemails left after 4:00 p.m. may not be returned until the following business day.  We are closed weekends and major holidays. You have access to a nurse at all times for urgent questions. Please call the main number to the clinic [phone] and follow the prompts.  For any non-urgent questions, you may also contact your provider using MyChart. We now offer e-Visits for anyone 18 and older to request care online for non-urgent symptoms. For details visit [email or web address].   Also download the MyChart app! Go to the app store, search "MyChart", open the app, select [facility], and log in with your MyChart username and password.  Masks are optional in the cancer centers. If you would like for your care team to wear a mask while they are taking care of you, please let them know. For doctor visits, patients may have with them one support person who is at least 75 years old. At this time, visitors are not allowed in the infusion area.   

## 2022-10-10 NOTE — Progress Notes (Signed)
Hematology/Oncology Progress note [phone] [phone]      Patient Care Team: Olin Hauser, DO as PCP - General (Family Medicine) Clent Jacks, RN as Registered Nurse Earlie Server, MD as Consulting Physician (Hematology and Oncology) Cammie Sickle, MD as Consulting Physician (Oncology)  ASSESSMENT & PLAN:   Cancer Staging  Rectal cancer [facility]) Staging form: Colon and Rectum, AJCC 8th Edition - Pathologic stage from 10/06/2019: Stage IIC (ypT4b, pN0, cM0) - Signed by Earlie Server, MD on 10/06/2019 - Clinical stage from 06/30/2020: Jacqueline Yoder - Signed by Earlie Server, MD on 04/17/2022 - Pathologic: Stage Unknown (rpTX, pNX, cM1) - Signed by Earlie Server, MD on 01/31/2021   Rectal cancer [facility]) History of stage IIIC Rectal cancer, s/p TNT, followed by 09/17/19 APR/posterior vaginectomy/TAH/BSO/VY-flap, pT4b pN0 with close vaginal margin 0.2 mm.  Uterus and ovaries negative for malignancy. palliative radiation to vaginal recurrence- 01/19/21 recurrence with lung metastasis.-Palliative -FOLFIRI plus bevacizumab.  Irinotecan was dropped in November 2022 due to side effects. Negative for UGT1A1*28 - radiographically stable, rise of CEA-July 2023 CT showed slightly progression of lung metastasis. Labs are reviewed and discussed with patient. CEA progressively increases PET showed progression in pelvic lymph nodes and bone lesions, switch to irinotecan +panitumumab Rationale and side effects were discussed with patient and daughter.  Proceed with  irinotecan +panitumumab  Anemia due to antineoplastic chemotherapy Hb is stable. observation  Encounter for antineoplastic chemotherapy Chemotherapy plan as listed above.   History of pulmonary embolism Continue  Eliquis 2.5 mg twice daily for anticoagulation prophylaxis.   Follow up in 2 weeks. Lab MD next cycle of chemo. All questions were answered. The patient knows to call the clinic with any problems,  questions or concerns.  Earlie Server, MD, PhD [facility] Hematology Oncology 10/10/2022      CHIEF COMPLAINTS/REASON FOR VISIT:  Follow up for rectal cancer  HISTORY OF PRESENTING ILLNESS:  Patient initially presented with complaints of postmenopausal bleeding on 08/16/2018.  History of was menopausal vaginal bleeding in 2016 which resulted in cervical polypectomy.  Pathology 02/04/2015 showed cervical polyp, consistent with benign endometrial polyp.  [name] after polypectomy due to anxiety associated with pelvic exams.  pelvic exam on 08/16/2018 reviewed cervical abnormality and from enlarged uterus. Seen by Dr. Marcelline Mates on 10/29/2018.  Endometrial biopsy and a Pap smear was performed. 10/29/2018 Pap smear showed adenocarcinoma, favor endometrial origin. 10/29/2018 endometrial biopsy showed endometrioid carcinoma, FIGO grade 1.  10/29/2018- TA & TV Ultrasound revealed: Anteverted uterus measuring 8.7 x 5.6 x 6.4 cm without evidence of focal masses.  The endometrium measuring 24.1 mm (thickened) and heterogeneous.  Right and left ovaries not visualized.  No adnexal masses identified.  No free fluid in cul-de-sac.  Patient was seen by Dr. Theora Gianotti in clinic on 11/13/2018.  Cervical exam reveals 2 cm exophytic irregular mass consistent with malignancy.   11/19/2018 CT chest abdomen pelvis with contrast showed thickened endometrium with some irregularity compatible with the provided diagnosis of endometrial malignancy.  There is a mildly prominent left inguinal node 1.4 cm.  Patient was seen by Dr. Fransisca Connors on 11/20/2018 and left groin lymph node biopsy was recommended.  11/26/2018 patient underwent left inguinal lymph node biopsy. Pathology showed metastatic adenocarcinoma consistent with colorectal origin.  CDX 2+.  Case was discussed on tumor board.  Recommend colonoscopy for further evaluation.  Patient reports significant weight loss 30 pounds over the last year.  Chronic vaginal  spotting. Change of bowel habits the past  few months.  More constipated.  Family history positive for brother who has colon cancer prostate cancer.  patient has underwent colonoscopy on 12/03/2018 which reviewed a nonobstructing large mass in the rectum.  Also chronic fistula.  Mass was not circumferential.  This was biopsied with a cold forceps for histology.  Pathology came back hyperplastic polyp negative for dysplasia and malignancy. [name], patient underwent flex sigmoidoscopy on 12/06/2018 with rebiopsy of the rectal mass. This time biopsy results came back positive for invasive colorectal adenocarcinoma, moderately differentiated. Immunotherapy for nearly mismatch repair protein (MMR ) was performed.  There is no loss of MMR expression.  low probability of MSI high.   # Seen by [name] for evaluation of resectability for rectal cancer. In addition, she also had a second opinion with [facility] where her endometrial biopsy pathology was changed to  adenocarcinoma, consistent with colorectal primary.   [name]. She has home health that has been assisting with ostomy care  Patient was also evaluated by [facility] oncology.  Recommendation is to proceed with TNT with concurrent chemoradiation followed by neoadjuvant chemotherapy followed by surgical resection. Patient prefers to have treatment done locally with [facility].   # Oncology Treatment:  02/03/2019- 03/19/2019  concurrent Xeloda and radiation.  Xeloda dose 851m /m2 BID - rounded to 16579mBID- on days of radiation. 04/09/2019, started on FOLFOX with bolus early.  Omitted.  07/16/2019 finished 8 cycles of FOLFOX. 09/17/19 APR/posterior vaginectomy/TAH/BSO/VY-flap pT4b pN0 with close vaginal margin 0.2 mm.  Uterus and ovaries negative for malignancy. Patient reports bilateral lower extremity numbness and tingling, intermittent, left worse than right. She has lost a lot of  weight since her APR surgery.   #Family history with half brother having's history of colon cancer prostate cancer.  Personal history of colorectal cancer.  Patient has not decided if she wants genetic testing.    # history of PE( 01/13/2020)  in the bilateral lower extremity DVT (01/13/2020).   She finishes 6 months of anticoagulation with Eliquis 5 mg twice daily. Now switched to Eliquis 2.5 mg twice daily..  # She has now developed recurrent disease. #06/30/20  vaginal introitus mass biopsied. Pathology is consistent with metastatic colorectal adenocarcinoma I have discussed with [name]  Dr. MaHester Matesnd the mass is not resectable. Patient has also had colonoscopy by Dr. AnVicente Malesesterday. Normal examination. # 07/16/2020 cycle 1 FOLFIRI  # 07/20/2020 PET scan was done for further evaluation, images are consistent with local recurrence, no distant metastasis. #Discussed with radiation oncology Dr. ChBaruch Goutyill recommends concurrent chemotherapy and radiation. 08/02/2020-08/16/2020, patient starts radiation.  Xeloda was held due to neutropenia 08/17/2020,-09/06/2020 Xeloda 1500 mg twice daily concurrently with radiation  01/31/21 started on FOLFIRI + Bev 05/18/2021 CT chest abdomen pelvis showed Previously noted enlargement of bilateral inguinal lymph nodes is resolved, consistent with treatment response of nodal metastatic disease. Interval decrease in size of multiple small bilateral pulmonary nodules, consistent with treatment response of pulmonary metastatic disease. No evidence of new metastatic disease. 05/24/2021 - 08/30/2021, continued on FOLFIRI plus bevacizumab.  Irinotecan dose was reduced, eventually 1004m2  09/02/2021, CT chest abdomen pelvis without contrast Showed small bilateral pulmonary nodules, unchanged.  Stable metastatic disease.  No noncontrast evidence of new metastatic disease in the chest abdomen pelvis.  Small parastomal hernia.  Enlargement of main pulmonary artery.   Coronary artery disease.  09/13/2021, maintenance 5-FU/bevacizumab 11/28/2021, 5-FU/Irinotecan/bevacizumab.  Irinotecan 100 mg/m2 was added back due to  progressively increasing CEA.  INTERVAL HISTORY Jacqueline Yoder is a 76 y.o. female who has above history reviewed by me presents for follow-up of rectal cancer. Currently on dose reduced FOLFIRI/bevacizumab  she tolerates well. No significant increase of ostomy output, no nausea vomiting + left hip pain. She takes Tylenol PRN  + Left maxillary sinus pain has resolved after Augmentin. Weight remains stable comparing to last visit.     Review of Systems  Constitutional:  Positive for fatigue. Negative for appetite change, chills, fever and unexpected weight change.  HENT:   Negative for hearing loss and voice change.   Eyes:  Negative for eye problems.  Respiratory:  Negative for chest tightness and cough.   Cardiovascular:  Negative for chest pain.  Gastrointestinal:  Negative for abdominal distention, abdominal pain, blood in stool, constipation, diarrhea and nausea.  Endocrine: Negative for hot flashes.  Genitourinary:  Negative for difficulty urinating and frequency.   Musculoskeletal:  Positive for arthralgias.  Skin:  Negative for itching and rash.  Neurological:  Negative for extremity weakness and numbness.  Hematological:  Negative for adenopathy.  Psychiatric/Behavioral:  Negative for confusion.     MEDICAL HISTORY:  Past Medical History:  Diagnosis Date   Allergy    Arthritis    Blood clot in vein    Family history of colon cancer    GERD (gastroesophageal reflux disease)    Hypercholesteremia    Hypertension    Hypertension    Lower extremity edema    Personal history of chemotherapy    Rectal cancer ([place]) 12/2018   Urinary incontinence     SURGICAL HISTORY: Past Surgical History:  Procedure Laterality Date   ABDOMINAL HYSTERECTOMY     CHOLECYSTECTOMY  1971   COLONOSCOPY WITH PROPOFOL N/A 12/03/2018    Procedure: COLONOSCOPY WITH PROPOFOL;  Surgeon: Lucilla Lame, MD;  Location: [facility] ENDOSCOPY;  Service: Endoscopy;  Laterality: N/A;   COLONOSCOPY WITH PROPOFOL N/A 07/15/2020   Procedure: COLONOSCOPY WITH PROPOFOL;  Surgeon: [name], MD;  Location: [facility] ENDOSCOPY;  Service: Gastroenterology;  Laterality: N/A;   FLEXIBLE SIGMOIDOSCOPY N/A 12/06/2018   Procedure: FLEXIBLE SIGMOIDOSCOPY;  Surgeon: [name], MD;  Location: [facility] ENDOSCOPY;  Service: Endoscopy;  Laterality: N/A;   LAPAROSCOPIC COLOSTOMY  01/06/2019   PORTACATH PLACEMENT N/A 04/03/2019   Procedure: INSERTION PORT-A-CATH;  Surgeon: Jules Husbands, MD;  Location: [facility] ORS;  Service: General;  Laterality: N/A;    SOCIAL HISTORY: Social History   Socioeconomic History   Marital status: Widowed    Spouse name: Not on file   Number of children: Not on file   Years of education: Not on file   Highest education level: Not on file  Occupational History   Not on file  Tobacco Use   Smoking status: Former    Types: Cigarettes    Quit date: 12/02/1977    Years since quitting: 44.8   Smokeless tobacco: Former  Scientific laboratory technician Use: Never used  Substance and Sexual Activity   Alcohol use: Never   Drug use: Never   Sexual activity: Not Currently    Birth control/protection: None  Other Topics Concern   Not on file  Social History Narrative   Lives with daughter   Social Determinants of Health   Financial Resource Strain: Medium Risk (06/30/2022)   Overall Financial Resource Strain (CARDIA)    Difficulty of Paying Living Expenses: Somewhat hard  Food Insecurity: No Food Insecurity (06/30/2022)   Hunger Vital Sign  Worried About Charity fundraiser in the Last Year: Never true    [place] in the Last Year: Never true  Transportation Needs: No Transportation Needs (06/30/2022)   PRAPARE - Hydrologist (Medical): No    Lack of Transportation (Non-Medical): No  Physical Activity:  Inactive (06/30/2022)   Exercise Vital Sign    Days of Exercise per Week: 0 days    Minutes of Exercise per Session: 0 min  Stress: No Stress Concern Present (06/30/2022)   [place]    Feeling of Stress : Not at all  Social Connections: Moderately Isolated (06/30/2022)   Social Connection and Isolation Panel [NHANES]    Frequency of Communication with Friends and Family: More than three times a week    Frequency of Social Gatherings with Friends and Family: Twice a week    Attends Religious Services: More than 4 times per year    Active Member of [employer] or Organizations: No    Attends Archivist Meetings: Never    Marital Status: Widowed  Intimate Partner Violence: Not At Risk (06/30/2022)   Humiliation, Afraid, Rape, and Kick questionnaire    Fear of Current or Ex-Partner: No    Emotionally Abused: No    Physically Abused: No    Sexually Abused: No    FAMILY HISTORY: Family History  Problem Relation Age of Onset   Colon cancer Brother 107       exposure to chemicals [place]   Hypertension Mother    Stroke Mother    Kidney failure Father    Breast cancer Neg Hx    Ovarian cancer Neg Hx     ALLERGIES:  is allergic to sulfamethoxazole-trimethoprim.  MEDICATIONS:  Current Outpatient Medications  Medication Sig Dispense Refill   Cholecalciferol (VITAMIN D3) 2000 units capsule Take 2,000 Units by mouth daily.     cholestyramine (QUESTRAN) 4 g packet Take 1 packet (4 g total) by mouth 3 (three) times daily. 90 each 1   diclofenac sodium (VOLTAREN) 1 % GEL Apply 2 g topically 4 (four) times daily as needed (joint pain).  11   diphenoxylate-atropine (LOMOTIL) 2.5-0.025 MG tablet Take 1 tablet by mouth 4 (four) times daily as needed for diarrhea or loose stools. 30 tablet 1   ELIQUIS 2.5 MG TABS tablet TAKE 1 TABLET BY MOUTH TWICE  DAILY 60 tablet 11   fluticasone (FLONASE) 50 MCG/ACT nasal spray USE 1 SPRAY  IN EACH NOSTRIL ONCE DAILY 16 g 3   gabapentin (NEURONTIN) 100 MG capsule Take 1 capsule (100 mg total) by mouth at bedtime. 30 capsule 0   lidocaine-prilocaine (EMLA) cream Apply 1 application. topically as needed. 30 g 6   loperamide (IMODIUM) 2 MG capsule Take 1 capsule (2 mg total) by mouth See admin instructions. With onset of loose stool, take 24m followed by 262mevery 2 hours,  Maximum: 16 mg/day 120 capsule 1   loratadine (CLARITIN) 10 MG tablet Take 10 mg by mouth daily.     Multiple Vitamins-Minerals (ONE-A-DAY WOMENS 50 PLUS PO) Take 1 tablet by mouth daily.      potassium chloride SA (KLOR-CON M) 20 MEQ tablet Take 1 tablet (20 mEq total) by mouth daily. 90 tablet 0   simvastatin (ZOCOR) 40 MG tablet Take 1 tablet (40 mg total) by mouth at bedtime. 100 tablet 3   triamterene-hydrochlorothiazide (DYAZIDE) 37.5-25 MG capsule Take 1 each (1 capsule total) by  mouth daily. 100 capsule 3   zinc gluconate 50 MG tablet Take 50 mg by mouth daily.     benzonatate (TESSALON) 200 MG capsule Take 1 capsule (200 mg total) by mouth 3 (three) times daily as needed for cough. (Patient not taking: Reported on 10/10/2022) 30 capsule 0   No current facility-administered medications for this visit.     PHYSICAL EXAMINATION: ECOG PERFORMANCE STATUS: 1 - Symptomatic but completely ambulatory  Physical Exam Constitutional:      General: She is not in acute distress. HENT:     Head: Normocephalic and atraumatic.  Eyes:     General: No scleral icterus. Cardiovascular:     Rate and Rhythm: Normal rate and regular rhythm.     Heart sounds: Normal heart sounds.  Pulmonary:     Effort: Pulmonary effort is normal. No respiratory distress.     Breath sounds: No wheezing.  Abdominal:     General: Bowel sounds are normal. There is no distension.     Palpations: Abdomen is soft.     Comments: + Colostomy bag   Musculoskeletal:        General: No deformity. Normal range of motion.     Cervical back:  Normal range of motion and neck supple.  Skin:    General: Skin is warm and dry.     Findings: No erythema or rash.  Neurological:     Mental Status: She is alert and oriented to person, place, and time. Mental status is at baseline.     Cranial Nerves: No cranial nerve deficit.     Coordination: Coordination normal.       LABORATORY DATA:  I have reviewed the data as listed    Latest Ref Rng & Units 10/10/2022    9:37 AM 09/25/2022    8:47 AM 09/11/2022    8:39 AM  CBC  WBC 4.0 - 10.5 K/uL 6.6  5.7  5.3   Hemoglobin 12.0 - 15.0 g/dL 12.0  12.2  12.1   Hematocrit 36.0 - 46.0 % 37.9  39.2  37.9   Platelets 150 - 400 K/uL 206  228  204       Latest Ref Rng & Units 10/10/2022    9:37 AM 09/25/2022    8:47 AM 09/11/2022    8:39 AM  CMP  Glucose 70 - 99 mg/dL 101  95  95   BUN 8 - 23 mg/dL _0 Creatinine 0.44 - 1.00 mg/dL 0.85  0.84  0.83   Sodium 135 - 145 mmol/L 135  138  135   Potassium 3.5 - 5.1 mmol/L 3.8  4.0  3.6   Chloride 98 - 111 mmol/L 104  102  101   CO2 22 - 32 mmol/L _1 Calcium 8.9 - 10.3 mg/dL 9.1  9.1  9.3   Total Protein 6.5 - 8.1 g/dL 6.8  6.9  7.1   Total Bilirubin 0.3 - 1.2 mg/dL 0.4  0.5  0.5   Alkaline Phos 38 - 126 U/L 78  95  74   AST 15 - 41 U/L 29  33  29   ALT 0 - 44 U/L 16  32  16      RADIOGRAPHIC STUDIES: I have personally reviewed the radiological images as listed and agreed with the findings in the report. NM PET Image Restag (PS) Skull Base To Thigh  Result Date: 09/20/2022 CLINICAL DATA:  Subsequent treatment strategy  for rectal cancer staging in a 76 year old female. EXAM: NUCLEAR MEDICINE PET SKULL BASE TO THIGH TECHNIQUE: 8.74 mCi F-18 FDG was injected intravenously. Full-ring PET imaging was performed from the skull base to thigh after the radiotracer. CT data was obtained and used for attenuation correction and anatomic localization. Fasting blood glucose: 82 mg/dl COMPARISON:  July 17, 2022 FINDINGS:  Mediastinal blood pool activity: SUV max 1.55 Liver activity: SUV max NA NECK: No hypermetabolic lymph nodes in the neck. Incidental CT findings: None. CHEST: Scattered small pulmonary nodules largest in the medial RIGHT middle lobe (image 97/2) approximately 7 mm visible FDG uptake in this area is subtle. This dominant nodule is diminished in size compared to more remote imaging from January 19, 2021. These areas accounting for respiratory motion on the current study are not substantially changed compared to recent CT imaging. No adenopathy in the chest. Incidental CT findings: RIGHT Port-A-Cath terminates at the caval to atrial junction. No pericardial effusion. Otherwise unremarkable appearance of the heart great vessels on noncontrast imaging. Lungs are clear and airways are patent aside from pulmonary nodules discussed above. ABDOMEN/PELVIS: RIGHT groin lymph nodes (image 203/2 11 mm RIGHT inguinal lymph node with a maximum SUV of 5.13. Adjacent smaller lymph nodes with slightly less activity. RIGHT external iliac lymph node (image 196/2) 7 mm short axis with a maximum SUV of 7.71. Incidental CT findings: No acute findings relative to liver, spleen, pancreas, adrenal glands, kidneys, stomach, small or large bowel following APR. LEFT lower quadrant colostomy as on previous imaging. Aortic atherosclerosis. SKELETON: Diffuse marrow space uptake of FDG. Subtle lucency in the RIGHT humeral head as compared to the LEFT humeral head with dramatic differential FDG uptake, more focal appearing on the RIGHT with a maximum SUV of 9.65, uptake in the marrow space of the LEFT proximal humerus is 4.80. Area of abnormality with cortical irregularity of the LEFT posterior iliac showing a maximum SUV of 4.70 similar lucent and sclerotic appearance. Relative lack of uptake in pelvic marrow spaces following radiotherapy. Incidental CT findings: Spinal degenerative changes. IMPRESSION: 1. Signs of nodal disease in the RIGHT inguinal  region and RIGHT pelvis as discussed. 2. Scattered pulmonary nodules indicative of metastatic disease with limited FDG uptake and no substantial change compared to more recent imaging. 3. No signs of solid organ disease. 4. Diffuse marrow space activity could be seen in the setting of marrow stimulation, this confounds further assessment of areas discussed above in the RIGHT proximal humerus and LEFT iliac bone in particular MRI of the RIGHT proximal humerus could be performed particularly if there are new symptoms in this location to assess for any focal underlying signs of bony metastatic disease. Morphologic changes and mild increased metabolic activity on the LEFT as compared to the RIGHT within the iliac crest remains suspicious for metastatic disease. 5. Postoperative changes of APR and LEFT lower quadrant colostomy. 6. Aortic atherosclerosis. Aortic Atherosclerosis (ICD10-I70.0). Electronically Signed   By: Zetta Bills M.D.   On: 09/20/2022 14:46   CT CHEST ABDOMEN PELVIS W CONTRAST  Result Date: 07/18/2022 CLINICAL DATA:  Metastatic rectal adenocarcinoma. Ongoing chemotherapy. * Tracking Code: BO * EXAM: CT CHEST, ABDOMEN, AND PELVIS WITH CONTRAST TECHNIQUE: Multidetector CT imaging of the chest, abdomen and pelvis was performed following the standard protocol during bolus administration of intravenous contrast. RADIATION DOSE REDUCTION: This exam was performed according to the departmental dose-optimization program which includes automated exposure control, adjustment of the mA and/or kV according to patient size and/or use of  iterative reconstruction technique. CONTRAST:  50m OMNIPAQUE IOHEXOL 300 MG/ML  SOLN COMPARISON:  04/26/2022 FINDINGS: CT CHEST FINDINGS Cardiovascular: Coronary, aortic arch, and branch vessel atherosclerotic vascular disease. Right Port-A-Cath tip: Cavoatrial junction. Mediastinum/Nodes: Unremarkable Lungs/Pleura: Generally stable small scattered bilateral pulmonary nodules.  One of the largest is a bilobed 8 by 6 mm right middle lobe nodule on image 106 series 3. No new nodules identified. Musculoskeletal: Degenerative glenohumeral arthropathy noted. Whole-body bone scan rib revealed accentuated activity in the right humeral head, on today's CT I do not see a specific cause for this accentuated activity, although MRI might be more sensitive. Thoracic spondylosis noted. CT ABDOMEN PELVIS FINDINGS Hepatobiliary: Gallbladder absent. Stable mild intrahepatic biliary dilatation, possibly a physiologic response to cholecystectomy. No significant focal liver lesion. Pancreas: Unremarkable Spleen: Unremarkable Adrenals/Urinary Tract: Stable appearance of bilateral benign-appearing renal cysts which do not warrant individual follow up. Adrenal glands unremarkable. Stomach/Bowel: Descending colostomy. There some small diverticula of the descending colon without findings of active diverticulitis. Pelvic floor laxity with loops of small bowel extending 3 cm below the pubococcygeal line. Vascular/Lymphatic: Atherosclerosis is present, including aortoiliac atherosclerotic disease. Atherosclerotic plaque at the origin of the celiac trunk and proximally in the SMA, causing stenosis in the proximal SMA no compelling findings of occlusion of the superior mesenteric artery. No pathologic adenopathy is identified. Reproductive: Uterus absent.  Adnexa unremarkable. Other: No supplemental non-categorized findings. Musculoskeletal: Pelvic floor laxity. Subtle chronic posterior cortical irregularity and heterogeneity in the left iliac bone on image 82 of series 2, not appreciable on the bone scan of 07/14/2022, this has been present over the past year and could reflect radiation therapy related findings although strictly speaking, malignant involvement of the left iliac bone is difficult to exclude. Lumbar spondylosis and degenerative disc disease with mild resulting left foraminal stenosis at L4-5.  IMPRESSION: 1. Stable scattered small bilateral pulmonary nodules, largest 8 by 6 mm in the right middle lobe. 2. Subtle chronically stable posterior cortical irregularity and heterogeneity in the left iliac bone, not appreciable on the bone scan of 07/14/2022. This has been present at least over the past year and could reflect radiation therapy related findings although strictly speaking, malignant involvement of the left iliac bone is difficult to exclude. If clinically warranted this could be further assessed with nuclear medicine PET-CT or MRI of the bony pelvis with and without contrast. 3. Pelvic floor laxity with loops of small bowel extending 3 cm below the pubococcygeal line. 4. Aortic and systemic atherosclerosis as detailed above. Aortic Atherosclerosis (ICD10-I70.0). Electronically Signed   By: WVan ClinesM.D.   On: 07/18/2022 13:32   NM Bone Scan Whole Body  Result Date: 07/14/2022 CLINICAL DATA:  Metastatic carcinoma. Hip pain. Metastatic adenocarcinoma. Rectal carcinoma EXAM: NUCLEAR MEDICINE WHOLE BODY BONE SCAN TECHNIQUE: Whole body anterior and posterior images were obtained approximately 3 hours after intravenous injection of radiopharmaceutical. RADIOPHARMACEUTICALS:  21.9 mCi Technetium-963mDP IV COMPARISON:  Hip radiograph 06/28/2022, CT chest abdomen pelvis 03/27/2022 FINDINGS: No abnormal radiotracer accumulation within the LEFT or RIGHT hip. No abnormal accumulation within the pelvis or spine. Intense uptake within the RIGHT humeral head mild uptake in the LEFT humeral head. No lesion identified in the RIGHT humeral head on CT 04/26/2022 (partially imaged). Additionally there was mild radiotracer activity associated with the glenohumeral joint on the RIGHT on FDG PET-CT scan 07/20/2020. This combination of findings favors degenerative uptake in the RIGHT humeral head. IMPRESSION: 1. No evidence of skeletal metastatic disease in the hips. 2.  Asymmetric increased activity in the  RIGHT humeral head is favored degenerative as described above. If pain in the RIGHT shoulder consider further evaluation with plain film or MRI. Electronically Signed   By: Suzy Bouchard M.D.   On: 07/14/2022 12:59      NM PET Image Restag (PS) Skull Base To Thigh  Result Date: 09/20/2022 CLINICAL DATA:  Subsequent treatment strategy for rectal cancer staging in a 76 year old female. EXAM: NUCLEAR MEDICINE PET SKULL BASE TO THIGH TECHNIQUE: 8.74 mCi F-18 FDG was injected intravenously. Full-ring PET imaging was performed from the skull base to thigh after the radiotracer. CT data was obtained and used for attenuation correction and anatomic localization. Fasting blood glucose: 82 mg/dl COMPARISON:  July 17, 2022 FINDINGS: Mediastinal blood pool activity: SUV max 1.55 Liver activity: SUV max NA NECK: No hypermetabolic lymph nodes in the neck. Incidental CT findings: None. CHEST: Scattered small pulmonary nodules largest in the medial RIGHT middle lobe (image 97/2) approximately 7 mm visible FDG uptake in this area is subtle. This dominant nodule is diminished in size compared to more remote imaging from January 19, 2021. These areas accounting for respiratory motion on the current study are not substantially changed compared to recent CT imaging. No adenopathy in the chest. Incidental CT findings: RIGHT Port-A-Cath terminates at the caval to atrial junction. No pericardial effusion. Otherwise unremarkable appearance of the heart great vessels on noncontrast imaging. Lungs are clear and airways are patent aside from pulmonary nodules discussed above. ABDOMEN/PELVIS: RIGHT groin lymph nodes (image 203/2 11 mm RIGHT inguinal lymph node with a maximum SUV of 5.13. Adjacent smaller lymph nodes with slightly less activity. RIGHT external iliac lymph node (image 196/2) 7 mm short axis with a maximum SUV of 7.71. Incidental CT findings: No acute findings relative to liver, spleen, pancreas, adrenal glands, kidneys,  stomach, small or large bowel following APR. LEFT lower quadrant colostomy as on previous imaging. Aortic atherosclerosis. SKELETON: Diffuse marrow space uptake of FDG. Subtle lucency in the RIGHT humeral head as compared to the LEFT humeral head with dramatic differential FDG uptake, more focal appearing on the RIGHT with a maximum SUV of 9.65, uptake in the marrow space of the LEFT proximal humerus is 4.80. Area of abnormality with cortical irregularity of the LEFT posterior iliac showing a maximum SUV of 4.70 similar lucent and sclerotic appearance. Relative lack of uptake in pelvic marrow spaces following radiotherapy. Incidental CT findings: Spinal degenerative changes. IMPRESSION: 1. Signs of nodal disease in the RIGHT inguinal region and RIGHT pelvis as discussed. 2. Scattered pulmonary nodules indicative of metastatic disease with limited FDG uptake and no substantial change compared to more recent imaging. 3. No signs of solid organ disease. 4. Diffuse marrow space activity could be seen in the setting of marrow stimulation, this confounds further assessment of areas discussed above in the RIGHT proximal humerus and LEFT iliac bone in particular MRI of the RIGHT proximal humerus could be performed particularly if there are new symptoms in this location to assess for any focal underlying signs of bony metastatic disease. Morphologic changes and mild increased metabolic activity on the LEFT as compared to the RIGHT within the iliac crest remains suspicious for metastatic disease. 5. Postoperative changes of APR and LEFT lower quadrant colostomy. 6. Aortic atherosclerosis. Aortic Atherosclerosis (ICD10-I70.0). Electronically Signed   By: Zetta Bills M.D.   On: 09/20/2022 14:46   CT CHEST ABDOMEN PELVIS W CONTRAST  Result Date: 07/18/2022 CLINICAL DATA:  Metastatic rectal adenocarcinoma. Ongoing chemotherapy. *  Tracking Code: BO * EXAM: CT CHEST, ABDOMEN, AND PELVIS WITH CONTRAST TECHNIQUE: Multidetector  CT imaging of the chest, abdomen and pelvis was performed following the standard protocol during bolus administration of intravenous contrast. RADIATION DOSE REDUCTION: This exam was performed according to the departmental dose-optimization program which includes automated exposure control, adjustment of the mA and/or kV according to patient size and/or use of iterative reconstruction technique. CONTRAST:  13m OMNIPAQUE IOHEXOL 300 MG/ML  SOLN COMPARISON:  04/26/2022 FINDINGS: CT CHEST FINDINGS Cardiovascular: Coronary, aortic arch, and branch vessel atherosclerotic vascular disease. Right Port-A-Cath tip: Cavoatrial junction. Mediastinum/Nodes: Unremarkable Lungs/Pleura: Generally stable small scattered bilateral pulmonary nodules. One of the largest is a bilobed 8 by 6 mm right middle lobe nodule on image 106 series 3. No new nodules identified. Musculoskeletal: Degenerative glenohumeral arthropathy noted. Whole-body bone scan rib revealed accentuated activity in the right humeral head, on today's CT I do not see a specific cause for this accentuated activity, although MRI might be more sensitive. Thoracic spondylosis noted. CT ABDOMEN PELVIS FINDINGS Hepatobiliary: Gallbladder absent. Stable mild intrahepatic biliary dilatation, possibly a physiologic response to cholecystectomy. No significant focal liver lesion. Pancreas: Unremarkable Spleen: Unremarkable Adrenals/Urinary Tract: Stable appearance of bilateral benign-appearing renal cysts which do not warrant individual follow up. Adrenal glands unremarkable. Stomach/Bowel: Descending colostomy. There some small diverticula of the descending colon without findings of active diverticulitis. Pelvic floor laxity with loops of small bowel extending 3 cm below the pubococcygeal line. Vascular/Lymphatic: Atherosclerosis is present, including aortoiliac atherosclerotic disease. Atherosclerotic plaque at the origin of the celiac trunk and proximally in the SMA, causing  stenosis in the proximal SMA no compelling findings of occlusion of the superior mesenteric artery. No pathologic adenopathy is identified. Reproductive: Uterus absent.  Adnexa unremarkable. Other: No supplemental non-categorized findings. Musculoskeletal: Pelvic floor laxity. Subtle chronic posterior cortical irregularity and heterogeneity in the left iliac bone on image 82 of series 2, not appreciable on the bone scan of 07/14/2022, this has been present over the past year and could reflect radiation therapy related findings although strictly speaking, malignant involvement of the left iliac bone is difficult to exclude. Lumbar spondylosis and degenerative disc disease with mild resulting left foraminal stenosis at L4-5. IMPRESSION: 1. Stable scattered small bilateral pulmonary nodules, largest 8 by 6 mm in the right middle lobe. 2. Subtle chronically stable posterior cortical irregularity and heterogeneity in the left iliac bone, not appreciable on the bone scan of 07/14/2022. This has been present at least over the past year and could reflect radiation therapy related findings although strictly speaking, malignant involvement of the left iliac bone is difficult to exclude. If clinically warranted this could be further assessed with nuclear medicine PET-CT or MRI of the bony pelvis with and without contrast. 3. Pelvic floor laxity with loops of small bowel extending 3 cm below the pubococcygeal line. 4. Aortic and systemic atherosclerosis as detailed above. Aortic Atherosclerosis (ICD10-I70.0). Electronically Signed   By: WVan ClinesM.D.   On: 07/18/2022 13:32   NM Bone Scan Whole Body  Result Date: 07/14/2022 CLINICAL DATA:  Metastatic carcinoma. Hip pain. Metastatic adenocarcinoma. Rectal carcinoma EXAM: NUCLEAR MEDICINE WHOLE BODY BONE SCAN TECHNIQUE: Whole body anterior and posterior images were obtained approximately 3 hours after intravenous injection of radiopharmaceutical.  RADIOPHARMACEUTICALS:  21.9 mCi Technetium-952mDP IV COMPARISON:  Hip radiograph 06/28/2022, CT chest abdomen pelvis 03/27/2022 FINDINGS: No abnormal radiotracer accumulation within the LEFT or RIGHT hip. No abnormal accumulation within the pelvis or spine. Intense uptake within the  RIGHT humeral head mild uptake in the LEFT humeral head. No lesion identified in the RIGHT humeral head on CT 04/26/2022 (partially imaged). Additionally there was mild radiotracer activity associated with the glenohumeral joint on the RIGHT on FDG PET-CT scan 07/20/2020. This combination of findings favors degenerative uptake in the RIGHT humeral head. IMPRESSION: 1. No evidence of skeletal metastatic disease in the hips. 2. Asymmetric increased activity in the RIGHT humeral head is favored degenerative as described above. If pain in the RIGHT shoulder consider further evaluation with plain film or MRI. Electronically Signed   By: Suzy Bouchard M.D.   On: 07/14/2022 12:59

## 2022-10-10 NOTE — Assessment & Plan Note (Signed)
History of stage IIIC Rectal cancer, s/p TNT, followed by 09/17/19 APR/posterior vaginectomy/TAH/BSO/VY-flap, pT4b pN0 with close vaginal margin 0.2 mm.  Uterus and ovaries negative for malignancy. palliative radiation to vaginal recurrence- 01/19/21 recurrence with lung metastasis.-Palliative -FOLFIRI plus bevacizumab.  Irinotecan was dropped in November 2022 due to side effects. Negative for UGT1A1*28 - radiographically stable, rise of CEA-July 2023 CT showed slightly progression of lung metastasis. Labs are reviewed and discussed with patient. CEA progressively increases PET showed progression in pelvic lymph nodes and bone lesions, switch to irinotecan +panitumumab Rationale and side effects were discussed with patient and daughter.  Proceed with  irinotecan +panitumumab

## 2022-10-10 NOTE — Assessment & Plan Note (Signed)
Hb is stable. observation 

## 2022-10-10 NOTE — Assessment & Plan Note (Signed)
Chemotherapy plan as listed above 

## 2022-10-10 NOTE — Assessment & Plan Note (Signed)
Continue  Eliquis 2.5 mg twice daily for anticoagulation prophylaxis. 

## 2022-10-11 ENCOUNTER — Encounter: Payer: Self-pay | Admitting: Radiation Oncology

## 2022-10-11 ENCOUNTER — Ambulatory Visit
Admission: RE | Admit: 2022-10-11 | Discharge: 2022-10-11 | Disposition: A | Discharge status: Home / Self Care | Payer: Medicare Other | Source: Ambulatory Visit | Attending: Radiation Oncology | Admitting: Radiation Oncology

## 2022-10-11 VITALS — BP 114/67 | HR 66 | Temp 99.5°F | Resp 20 | Ht 64.0 in | Wt 155.4 lb

## 2022-10-11 DIAGNOSIS — C20 Malignant neoplasm of rectum: Secondary | ICD-10-CM | POA: Insufficient documentation

## 2022-10-11 DIAGNOSIS — C7800 Secondary malignant neoplasm of unspecified lung: Secondary | ICD-10-CM | POA: Diagnosis not present

## 2022-10-11 DIAGNOSIS — C7951 Secondary malignant neoplasm of bone: Secondary | ICD-10-CM | POA: Insufficient documentation

## 2022-10-11 DIAGNOSIS — Z923 Personal history of irradiation: Secondary | ICD-10-CM | POA: Insufficient documentation

## 2022-10-11 LAB — CEA: CEA: 2934 ng/mL — ABNORMAL HIGH (ref 0.0–4.7)

## 2022-10-11 NOTE — Progress Notes (Signed)
Radiation Oncology Follow up Note  Name: Jacqueline Yoder   Date:   10/11/2022 MRN:  [id number] DOB: 1946/03/07    This 76 y.o. female presents to the clinic today for 2-year follow-up status post palliative radiation therapy to her pelvis and patient with known stage IIIc adenocarcinoma of the time (T4b N1a M0) who had rectal, carcinoma involving the vagina.  REFERRING PROVIDER: Nobie Putnam *  HPI: Patient is a 76 year old female now out 2 years having completed palliative radiation therapy to her pelvis for locally advanced rectal carcinoma with involvement of the vagina.  She is gone on to have definitive stage IV disease with most recent.PET CT scan showing scattered hypermetabolic pulmonary nodules as well as bone metastasis.  She has a large hypermetabolic lesion in her proximal right humerus.  She is having no pain at this time either her back or her upper extremity.  She is currently onirinotecan +panitumumab based on her recent progression of disease by PET/CT criteria.  Her bowel function is good she is having no significant diarrhea no increased lower urinary tract symptoms.  COMPLICATIONS OF TREATMENT: none  FOLLOW UP COMPLIANCE: keeps appointments   PHYSICAL EXAM:  BP 114/67   Pulse 66   Temp 99.5 F (37.5 C) (Tympanic)   Resp 20   Ht '5\' 4"'$  (1.626 m) Comment: stated ht  Wt 155 lb 6.4 oz (70.5 kg)   BMI 26.67 kg/m  Range of motion of right upper extremity does not elicit pain.  Deep palpation of her spine does not elicit pain motor and sensory levels are equal and symmetric in the lower extremities.  Well-developed well-nourished patient in NAD. HEENT reveals PERLA, EOMI, discs not visualized.  Oral cavity is clear. No oral mucosal lesions are identified. Neck is clear without evidence of cervical or supraclavicular adenopathy. Lungs are clear to A&P. Cardiac examination is essentially unremarkable with regular rate and rhythm without murmur rub or thrill. Abdomen is  benign with no organomegaly or masses noted. Motor sensory and DTR levels are equal and symmetric in the upper and lower extremities. Cranial nerves II through XII are grossly intact. Proprioception is intact. No peripheral adenopathy or edema is identified. No motor or sensory levels are noted. Crude visual fields are within normal range.  RADIOLOGY RESULTS: PET CT scan reviewed compatible with above-stated findings  PLAN: At this time patient will continue with systemic treatment under medical oncology's direction.  I have asked to see her back in 6 months.  Should she develop pain have instructed her to let the her team upstairs no for I would prescribe a short course of palliative radiation therapy.  Otherwise patient knows to call with any concerns.  I would like to take this opportunity to thank you for allowing me to participate in the care of your patient.Noreene Filbert, MD

## 2022-10-17 ENCOUNTER — Encounter: Payer: Self-pay | Admitting: Oncology

## 2022-10-19 ENCOUNTER — Encounter: Payer: Self-pay | Admitting: Oncology

## 2022-10-20 ENCOUNTER — Telehealth: Payer: Self-pay | Admitting: *Deleted

## 2022-10-20 NOTE — Telephone Encounter (Signed)
Received fax from [employer] for FMLA for patient daughter Jacqueline Yoder. Form completed and sent for physician signature

## 2022-10-20 NOTE — Telephone Encounter (Signed)
FMLA signed and faxed back to [employer]. Patient notified by My Chart msg

## 2022-10-23 MED FILL — Dexamethasone Sodium Phosphate Inj 100 MG/10ML: INTRAMUSCULAR | Qty: 1 | Status: AC

## 2022-10-24 ENCOUNTER — Inpatient Hospital Stay: Payer: Medicare Other | Admitting: Oncology

## 2022-10-24 ENCOUNTER — Inpatient Hospital Stay: Payer: Medicare Other

## 2022-10-24 ENCOUNTER — Encounter: Payer: Self-pay | Admitting: Oncology

## 2022-10-24 ENCOUNTER — Inpatient Hospital Stay: Payer: 59

## 2022-10-24 ENCOUNTER — Inpatient Hospital Stay (HOSPITAL_BASED_OUTPATIENT_CLINIC_OR_DEPARTMENT_OTHER): Payer: 59 | Admitting: Oncology

## 2022-10-24 ENCOUNTER — Inpatient Hospital Stay: Payer: 59 | Attending: Oncology

## 2022-10-24 VITALS — BP 131/70 | HR 73 | Temp 97.4°F | Wt 154.3 lb

## 2022-10-24 DIAGNOSIS — T451X5A Adverse effect of antineoplastic and immunosuppressive drugs, initial encounter: Secondary | ICD-10-CM

## 2022-10-24 DIAGNOSIS — Z90722 Acquired absence of ovaries, bilateral: Secondary | ICD-10-CM | POA: Insufficient documentation

## 2022-10-24 DIAGNOSIS — Z5112 Encounter for antineoplastic immunotherapy: Secondary | ICD-10-CM | POA: Diagnosis not present

## 2022-10-24 DIAGNOSIS — C20 Malignant neoplasm of rectum: Secondary | ICD-10-CM | POA: Diagnosis not present

## 2022-10-24 DIAGNOSIS — C7982 Secondary malignant neoplasm of genital organs: Secondary | ICD-10-CM | POA: Diagnosis not present

## 2022-10-24 DIAGNOSIS — Z86711 Personal history of pulmonary embolism: Secondary | ICD-10-CM | POA: Insufficient documentation

## 2022-10-24 DIAGNOSIS — C799 Secondary malignant neoplasm of unspecified site: Secondary | ICD-10-CM | POA: Diagnosis not present

## 2022-10-24 DIAGNOSIS — Z933 Colostomy status: Secondary | ICD-10-CM | POA: Diagnosis not present

## 2022-10-24 DIAGNOSIS — D6481 Anemia due to antineoplastic chemotherapy: Secondary | ICD-10-CM

## 2022-10-24 DIAGNOSIS — Z5111 Encounter for antineoplastic chemotherapy: Secondary | ICD-10-CM | POA: Diagnosis not present

## 2022-10-24 DIAGNOSIS — Z79899 Other long term (current) drug therapy: Secondary | ICD-10-CM | POA: Diagnosis not present

## 2022-10-24 DIAGNOSIS — Z7901 Long term (current) use of anticoagulants: Secondary | ICD-10-CM | POA: Diagnosis not present

## 2022-10-24 DIAGNOSIS — C7800 Secondary malignant neoplasm of unspecified lung: Secondary | ICD-10-CM | POA: Diagnosis not present

## 2022-10-24 DIAGNOSIS — Z87891 Personal history of nicotine dependence: Secondary | ICD-10-CM | POA: Diagnosis not present

## 2022-10-24 DIAGNOSIS — Z5189 Encounter for other specified aftercare: Secondary | ICD-10-CM | POA: Diagnosis not present

## 2022-10-24 DIAGNOSIS — D701 Agranulocytosis secondary to cancer chemotherapy: Secondary | ICD-10-CM | POA: Diagnosis not present

## 2022-10-24 DIAGNOSIS — Z86718 Personal history of other venous thrombosis and embolism: Secondary | ICD-10-CM | POA: Insufficient documentation

## 2022-10-24 DIAGNOSIS — Z90710 Acquired absence of both cervix and uterus: Secondary | ICD-10-CM | POA: Diagnosis not present

## 2022-10-24 LAB — COMPREHENSIVE METABOLIC PANEL
ALT: 31 U/L (ref 0–44)
AST: 42 U/L — ABNORMAL HIGH (ref 15–41)
Albumin: 3.7 g/dL (ref 3.5–5.0)
Alkaline Phosphatase: 67 U/L (ref 38–126)
Anion gap: 6 (ref 5–15)
BUN: 17 mg/dL (ref 8–23)
CO2: 28 mmol/L (ref 22–32)
Calcium: 9 mg/dL (ref 8.9–10.3)
Chloride: 102 mmol/L (ref 98–111)
Creatinine, Ser: 0.68 mg/dL (ref 0.44–1.00)
GFR, Estimated: >60 mL/min (ref >60)
Glucose, Bld: 101 mg/dL — ABNORMAL HIGH (ref 70–99)
Potassium: 3.5 mmol/L (ref 3.5–5.1)
Sodium: 136 mmol/L (ref 135–145)
Total Bilirubin: 0.5 mg/dL (ref 0.3–1.2)
Total Protein: 6.3 g/dL — ABNORMAL LOW (ref 6.5–8.1)

## 2022-10-24 LAB — CBC WITH DIFFERENTIAL/PLATELET
Abs Immature Granulocytes: 0.01 10*3/uL (ref 0.00–0.07)
Basophils Absolute: 0 10*3/uL (ref 0.0–0.1)
Basophils Relative: 1 %
Eosinophils Absolute: 0.1 10*3/uL (ref 0.0–0.5)
Eosinophils Relative: 4 %
HCT: 34.9 % — ABNORMAL LOW (ref 36.0–46.0)
Hemoglobin: 11.2 g/dL — ABNORMAL LOW (ref 12.0–15.0)
Immature Granulocytes: 0 %
Lymphocytes Relative: 40 %
Lymphs Abs: 1.2 10*3/uL (ref 0.7–4.0)
MCH: 30.9 pg (ref 26.0–34.0)
MCHC: 32.1 g/dL (ref 30.0–36.0)
MCV: 96.4 fL (ref 80.0–100.0)
Monocytes Absolute: 0.3 10*3/uL (ref 0.1–1.0)
Monocytes Relative: 11 %
Neutro Abs: 1.2 10*3/uL — ABNORMAL LOW (ref 1.7–7.7)
Neutrophils Relative %: 44 %
Platelets: 226 10*3/uL (ref 150–400)
RBC: 3.62 MIL/uL — ABNORMAL LOW (ref 3.87–5.11)
RDW: 16.2 % — ABNORMAL HIGH (ref 11.5–15.5)
WBC: 2.9 10*3/uL — ABNORMAL LOW (ref 4.0–10.5)
nRBC: 0 % (ref 0.0–0.2)

## 2022-10-24 LAB — MAGNESIUM: Magnesium: 2 mg/dL (ref 1.7–2.4)

## 2022-10-24 MED ORDER — SODIUM CHLORIDE 0.9 % IV SOLN
Freq: Once | INTRAVENOUS | Status: AC
  Filled 2022-10-24: qty 250

## 2022-10-24 MED ORDER — DIPHENHYDRAMINE HCL 50 MG/ML IJ SOLN
25.0000 mg | Freq: Once | INTRAMUSCULAR | Status: AC
  Administered 2022-10-24: 25 mg via INTRAVENOUS
  Filled 2022-10-24: qty 1

## 2022-10-24 MED ORDER — HEPARIN SOD (PORK) LOCK FLUSH 100 UNIT/ML IV SOLN
500.0000 [IU] | Freq: Once | INTRAVENOUS | Status: AC | PRN
  Administered 2022-10-24: 500 [IU]
  Filled 2022-10-24: qty 5

## 2022-10-24 MED ORDER — SODIUM CHLORIDE 0.9% FLUSH
10.0000 mL | INTRAVENOUS | Status: DC | PRN
  Administered 2022-10-24: 10 mL
  Filled 2022-10-24: qty 10

## 2022-10-24 MED ORDER — SODIUM CHLORIDE 0.9 % IV SOLN
6.0000 mg/kg | Freq: Once | INTRAVENOUS | Status: AC
  Administered 2022-10-24: 400 mg via INTRAVENOUS
  Filled 2022-10-24: qty 20

## 2022-10-24 MED ORDER — PALONOSETRON HCL INJECTION 0.25 MG/5ML
0.2500 mg | Freq: Once | INTRAVENOUS | Status: AC
  Administered 2022-10-24: 0.25 mg via INTRAVENOUS
  Filled 2022-10-24: qty 5

## 2022-10-24 MED ORDER — ATROPINE SULFATE 1 MG/ML IV SOLN
0.5000 mg | Freq: Once | INTRAVENOUS | Status: AC
  Administered 2022-10-24: 0.5 mg via INTRAVENOUS
  Filled 2022-10-24: qty 1

## 2022-10-24 MED ORDER — SODIUM CHLORIDE 0.9 % IV SOLN
100.0000 mg/m2 | Freq: Once | INTRAVENOUS | Status: AC
  Administered 2022-10-24: 180 mg via INTRAVENOUS
  Filled 2022-10-24: qty 9

## 2022-10-24 MED ORDER — SODIUM CHLORIDE 0.9 % IV SOLN
10.0000 mg | Freq: Once | INTRAVENOUS | Status: AC
  Administered 2022-10-24: 10 mg via INTRAVENOUS
  Filled 2022-10-24: qty 10

## 2022-10-24 NOTE — Addendum Note (Signed)
Addended by: Earlie Server on: 10/24/2022 12:23 PM   Modules accepted: Orders

## 2022-10-24 NOTE — Patient Instructions (Signed)
[facility]  Discharge Instructions: Thank you for choosing [facility] to provide your oncology and hematology care.  If you have a lab appointment with the Cancer Center, please go directly to the Cancer Center and check in at the registration area.  Wear comfortable clothing and clothing appropriate for easy access to any Portacath or PICC line.   We strive to give you quality time with your provider. You may need to reschedule your appointment if you arrive late (15 or more minutes).  Arriving late affects you and other patients whose appointments are after yours.  Also, if you miss three or more appointments without notifying the office, you may be dismissed from the clinic at the provider's discretion.      For prescription refill requests, have your pharmacy contact our office and allow 72 hours for refills to be completed.       To help prevent nausea and vomiting after your treatment, we encourage you to take your nausea medication as directed.  BELOW ARE SYMPTOMS THAT SHOULD BE REPORTED IMMEDIATELY: *FEVER GREATER THAN 100.4 F (38 C) OR HIGHER *CHILLS OR SWEATING *NAUSEA AND VOMITING THAT IS NOT CONTROLLED WITH YOUR NAUSEA MEDICATION *UNUSUAL SHORTNESS OF BREATH *UNUSUAL BRUISING OR BLEEDING *URINARY PROBLEMS (pain or burning when urinating, or frequent urination) *BOWEL PROBLEMS (unusual diarrhea, constipation, pain near the anus) TENDERNESS IN MOUTH AND THROAT WITH OR WITHOUT PRESENCE OF ULCERS (sore throat, sores in mouth, or a toothache) UNUSUAL RASH, SWELLING OR PAIN  UNUSUAL VAGINAL DISCHARGE OR ITCHING   Items with * indicate a potential emergency and should be followed up as soon as possible or go to the Emergency Department if any problems should occur.  Please show the CHEMOTHERAPY ALERT CARD or IMMUNOTHERAPY ALERT CARD at check-in to the Emergency Department and triage nurse.  Should you have questions after your  visit or need to cancel or reschedule your appointment, please contact [facility]  [phone] and follow the prompts.  Office hours are 8:00 a.m. to 4:30 p.m. Monday - Friday. Please note that voicemails left after 4:00 p.m. may not be returned until the following business day.  We are closed weekends and major holidays. You have access to a nurse at all times for urgent questions. Please call the main number to the clinic [phone] and follow the prompts.  For any non-urgent questions, you may also contact your provider using MyChart. We now offer e-Visits for anyone 18 and older to request care online for non-urgent symptoms. For details visit [email or web address].   Also download the MyChart app! Go to the app store, search "MyChart", open the app, select [facility], and log in with your MyChart username and password.  Masks are optional in the cancer centers. If you would like for your care team to wear a mask while they are taking care of you, please let them know. For doctor visits, patients may have with them one support person who is at least 76 years old. At this time, visitors are not allowed in the infusion area.   

## 2022-10-24 NOTE — Assessment & Plan Note (Addendum)
History of stage IIIC Rectal cancer, s/p TNT, followed by 09/17/19 APR/posterior vaginectomy/TAH/BSO/VY-flap, pT4b pN0 with close vaginal margin 0.2 mm.  Uterus and ovaries negative for malignancy. palliative radiation to vaginal recurrence- 01/19/21 recurrence with lung metastasis.-Palliative -FOLFIRI plus bevacizumab.  Irinotecan was dropped in November 2022 due to side effects. Negative for UGT1A1*28 - radiographically stable, rise of CEA-July 2023 CT lung metastasis worse--> Dec 2023  PET showed progression in pelvic lymph nodes and bone lesions, switch to irinotecan +panitumumab Labs are reviewed and discussed with patient. She tolerates well.  Proceed with  irinotecan +panitumumab Add Day 3 GCSF due to neutropenia

## 2022-10-24 NOTE — Assessment & Plan Note (Signed)
Chemotherapy plan as listed above 

## 2022-10-24 NOTE — Assessment & Plan Note (Signed)
Continue  Eliquis 2.5 mg twice daily for anticoagulation prophylaxis. 

## 2022-10-24 NOTE — Assessment & Plan Note (Signed)
Hb is stable. observation 

## 2022-10-24 NOTE — Progress Notes (Signed)
Hematology/Oncology Progress note [phone] [phone]      Patient Care Team: Olin Hauser, DO as PCP - General (Family Medicine) Clent Jacks, RN as Registered Nurse Earlie Server, MD as Consulting Physician (Hematology and Oncology) Cammie Sickle, MD as Consulting Physician (Oncology)  ASSESSMENT & PLAN:   Cancer Staging  Rectal cancer [facility]) Staging form: Colon and Rectum, AJCC 8th Edition - Pathologic stage from 10/06/2019: Stage IIC (ypT4b, pN0, cM0) - Signed by Earlie Server, MD on 10/06/2019 - Clinical stage from 06/30/2020: Jacqueline Yoder - Signed by Earlie Server, MD on 04/17/2022 - Pathologic: Stage Unknown (rpTX, pNX, cM1) - Signed by Earlie Server, MD on 01/31/2021   Rectal cancer [facility]) History of stage IIIC Rectal cancer, s/p TNT, followed by 09/17/19 APR/posterior vaginectomy/TAH/BSO/VY-flap, pT4b pN0 with close vaginal margin 0.2 mm.  Uterus and ovaries negative for malignancy. palliative radiation to vaginal recurrence- 01/19/21 recurrence with lung metastasis.-Palliative -FOLFIRI plus bevacizumab.  Irinotecan was dropped in November 2022 due to side effects. Negative for UGT1A1*28 - radiographically stable, rise of CEA-July 2023 CT lung metastasis worse--> Dec 2023  PET showed progression in pelvic lymph nodes and bone lesions, switch to irinotecan +panitumumab Labs are reviewed and discussed with patient. She tolerates well.  Proceed with  irinotecan +panitumumab Add Day 3 GCSF due to neutropenia  History of pulmonary embolism Continue  Eliquis 2.5 mg twice daily for anticoagulation prophylaxis.  Encounter for antineoplastic chemotherapy Chemotherapy plan as listed above.   Chemotherapy induced neutropenia (HCC) patient will receive long-acting GCSF prophylaxis on D3  Anemia due to antineoplastic chemotherapy Hb is stable. observation   Follow up per LOS All questions were answered. The patient knows to call the clinic with any  problems, questions or concerns.  Earlie Server, MD, PhD [facility] Hematology Oncology 10/24/2022      CHIEF COMPLAINTS/REASON FOR VISIT:  Follow up for rectal cancer  HISTORY OF PRESENTING ILLNESS:  Patient initially presented with complaints of postmenopausal bleeding on 08/16/2018.  History of was menopausal vaginal bleeding in 2016 which resulted in cervical polypectomy.  Pathology 02/04/2015 showed cervical polyp, consistent with benign endometrial polyp.  [name] after polypectomy due to anxiety associated with pelvic exams.  pelvic exam on 08/16/2018 reviewed cervical abnormality and from enlarged uterus. Seen by Dr. Marcelline Mates on 10/29/2018.  Endometrial biopsy and a Pap smear was performed. 10/29/2018 Pap smear showed adenocarcinoma, favor endometrial origin. 10/29/2018 endometrial biopsy showed endometrioid carcinoma, FIGO grade 1.  10/29/2018- TA & TV Ultrasound revealed: Anteverted uterus measuring 8.7 x 5.6 x 6.4 cm without evidence of focal masses.  The endometrium measuring 24.1 mm (thickened) and heterogeneous.  Right and left ovaries not visualized.  No adnexal masses identified.  No free fluid in cul-de-sac.  Patient was seen by Dr. Theora Gianotti in clinic on 11/13/2018.  Cervical exam reveals 2 cm exophytic irregular mass consistent with malignancy.   11/19/2018 CT chest abdomen pelvis with contrast showed thickened endometrium with some irregularity compatible with the provided diagnosis of endometrial malignancy.  There is a mildly prominent left inguinal node 1.4 cm.  Patient was seen by Dr. Fransisca Connors on 11/20/2018 and left groin lymph node biopsy was recommended.  11/26/2018 patient underwent left inguinal lymph node biopsy. Pathology showed metastatic adenocarcinoma consistent with colorectal origin.  CDX 2+.  Case was discussed on tumor board.  Recommend colonoscopy for further evaluation.  Patient reports significant weight loss 30 pounds over the last year.  Chronic vaginal  spotting. Change of bowel  habits the past few months.  More constipated.  Family history positive for brother who has colon cancer prostate cancer.  patient has underwent colonoscopy on 12/03/2018 which reviewed a nonobstructing large mass in the rectum.  Also chronic fistula.  Mass was not circumferential.  This was biopsied with a cold forceps for histology.  Pathology came back hyperplastic polyp negative for dysplasia and malignancy. [name], patient underwent flex sigmoidoscopy on 12/06/2018 with rebiopsy of the rectal mass. This time biopsy results came back positive for invasive colorectal adenocarcinoma, moderately differentiated. Immunotherapy for nearly mismatch repair protein (MMR ) was performed.  There is no loss of MMR expression.  low probability of MSI high.   # Seen by [name] for evaluation of resectability for rectal cancer. In addition, she also had a second opinion with [facility] pathology where her endometrial biopsy pathology was changed to  adenocarcinoma, consistent with colorectal primary.   [name]. She has home health that has been assisting with ostomy care  Patient was also evaluated by [facility] oncology.  Recommendation is to proceed with TNT with concurrent chemoradiation followed by neoadjuvant chemotherapy followed by surgical resection. Patient prefers to have treatment done locally with [facility].   # Oncology Treatment:  02/03/2019- 03/19/2019  concurrent Xeloda and radiation.  Xeloda dose '825mg'$  /m2 BID - rounded to '1650mg'$  BID- on days of radiation. 04/09/2019, started on FOLFOX with bolus early.  Omitted.  07/16/2019 finished 8 cycles of FOLFOX. 09/17/19 APR/posterior vaginectomy/TAH/BSO/VY-flap pT4b pN0 with close vaginal margin 0.2 mm.  Uterus and ovaries negative for malignancy. Patient reports bilateral lower extremity numbness and tingling, intermittent, left worse than right. She has lost a lot of  weight since her APR surgery.   #Family history with half brother having's history of colon cancer prostate cancer.  Personal history of colorectal cancer.  Patient has not decided if she wants genetic testing.    # history of PE( 01/13/2020)  in the bilateral lower extremity DVT (01/13/2020).   She finishes 6 months of anticoagulation with Eliquis 5 mg twice daily. Now switched to Eliquis 2.5 mg twice daily..  # She has now developed recurrent disease. #06/30/20  vaginal introitus mass biopsied. Pathology is consistent with metastatic colorectal adenocarcinoma I have discussed with [name]  Dr. Hester Mates and the mass is not resectable. Patient has also had colonoscopy by Dr. Vicente Males yesterday. Normal examination. # 07/16/2020 cycle 1 FOLFIRI  # 07/20/2020 PET scan was done for further evaluation, images are consistent with local recurrence, no distant metastasis. #Discussed with radiation oncology Dr. Baruch Gouty will recommends concurrent chemotherapy and radiation. 08/02/2020-08/16/2020, patient starts radiation.  Xeloda was held due to neutropenia 08/17/2020,-09/06/2020 Xeloda 1500 mg twice daily concurrently with radiation  01/31/21 started on FOLFIRI + Bev 05/18/2021 CT chest abdomen pelvis showed Previously noted enlargement of bilateral inguinal lymph nodes is resolved, consistent with treatment response of nodal metastatic disease. Interval decrease in size of multiple small bilateral pulmonary nodules, consistent with treatment response of pulmonary metastatic disease. No evidence of new metastatic disease. 05/24/2021 - 08/30/2021, continued on FOLFIRI plus bevacizumab.  Irinotecan dose was reduced, eventually '100mg'$ /m2  09/02/2021, CT chest abdomen pelvis without contrast Showed small bilateral pulmonary nodules, unchanged.  Stable metastatic disease.  No noncontrast evidence of new metastatic disease in the chest abdomen pelvis.  Small parastomal hernia.  Enlargement of main pulmonary artery.   Coronary artery disease.  09/13/2021, maintenance 5-FU/bevacizumab 11/28/2021, 5-FU/Irinotecan/bevacizumab.  Irinotecan 100 mg/m2 was added  back due to progressively increasing CEA.  INTERVAL HISTORY Jacqueline Yoder is a 77 y.o. female who has above history reviewed by me presents for follow-up of rectal cancer. On Irinotecan and panitumumab. she tolerates well. No significant increase of ostomy output, Denies any nausea vomiting abdominal pain or skin rash.  + left hip pain. She takes Tylenol PRN  Weight remains stable comparing to last visit.     Review of Systems  Constitutional:  Positive for fatigue. Negative for appetite change, chills, fever and unexpected weight change.  HENT:   Negative for hearing loss and voice change.   Eyes:  Negative for eye problems.  Respiratory:  Negative for chest tightness and cough.   Cardiovascular:  Negative for chest pain.  Gastrointestinal:  Negative for abdominal distention, abdominal pain, blood in stool, constipation, diarrhea and nausea.  Endocrine: Negative for hot flashes.  Genitourinary:  Negative for difficulty urinating and frequency.   Musculoskeletal:  Positive for arthralgias.  Skin:  Negative for itching and rash.  Neurological:  Negative for extremity weakness and numbness.  Hematological:  Negative for adenopathy.  Psychiatric/Behavioral:  Negative for confusion.     MEDICAL HISTORY:  Past Medical History:  Diagnosis Date   Allergy    Arthritis    Blood clot in vein    Family history of colon cancer    GERD (gastroesophageal reflux disease)    Hypercholesteremia    Hypertension    Hypertension    Lower extremity edema    Personal history of chemotherapy    Rectal cancer ([place]) 12/2018   Urinary incontinence     SURGICAL HISTORY: Past Surgical History:  Procedure Laterality Date   ABDOMINAL HYSTERECTOMY     CHOLECYSTECTOMY  1971   COLONOSCOPY WITH PROPOFOL N/A 12/03/2018   Procedure: COLONOSCOPY WITH PROPOFOL;   Surgeon: Lucilla Lame, MD;  Location: [facility] ENDOSCOPY;  Service: Endoscopy;  Laterality: N/A;   COLONOSCOPY WITH PROPOFOL N/A 07/15/2020   Procedure: COLONOSCOPY WITH PROPOFOL;  Surgeon: [name], MD;  Location: [facility] ENDOSCOPY;  Service: Gastroenterology;  Laterality: N/A;   FLEXIBLE SIGMOIDOSCOPY N/A 12/06/2018   Procedure: FLEXIBLE SIGMOIDOSCOPY;  Surgeon: [name], MD;  Location: [facility] ENDOSCOPY;  Service: Endoscopy;  Laterality: N/A;   LAPAROSCOPIC COLOSTOMY  01/06/2019   PORTACATH PLACEMENT N/A 04/03/2019   Procedure: INSERTION PORT-A-CATH;  Surgeon: Jules Husbands, MD;  Location: [facility] ORS;  Service: General;  Laterality: N/A;    SOCIAL HISTORY: Social History   Socioeconomic History   Marital status: Widowed    Spouse name: Not on file   Number of children: Not on file   Years of education: Not on file   Highest education level: Not on file  Occupational History   Not on file  Tobacco Use   Smoking status: Former    Types: Cigarettes    Quit date: 12/02/1977    Years since quitting: 44.9   Smokeless tobacco: Former  Scientific laboratory technician Use: Never used  Substance and Sexual Activity   Alcohol use: Never   Drug use: Never   Sexual activity: Not Currently    Birth control/protection: None  Other Topics Concern   Not on file  Social History Narrative   Lives with daughter   Social Determinants of Health   Financial Resource Strain: Medium Risk (06/30/2022)   Overall Financial Resource Strain (CARDIA)    Difficulty of Paying Living Expenses: Somewhat hard  Food Insecurity: No Food Insecurity (06/30/2022)   Hunger Vital Sign  Worried About Charity fundraiser in the Last Year: Never true    [place] in the Last Year: Never true  Transportation Needs: No Transportation Needs (06/30/2022)   PRAPARE - Hydrologist (Medical): No    Lack of Transportation (Non-Medical): No  Physical Activity: Inactive (06/30/2022)   Exercise Vital Sign     Days of Exercise per Week: 0 days    Minutes of Exercise per Session: 0 min  Stress: No Stress Concern Present (06/30/2022)   [place]    Feeling of Stress : Not at all  Social Connections: Moderately Isolated (06/30/2022)   Social Connection and Isolation Panel [NHANES]    Frequency of Communication with Friends and Family: More than three times a week    Frequency of Social Gatherings with Friends and Family: Twice a week    Attends Religious Services: More than 4 times per year    Active Member of [employer] or Organizations: No    Attends Archivist Meetings: Never    Marital Status: Widowed  Intimate Partner Violence: Not At Risk (06/30/2022)   Humiliation, Afraid, Rape, and Kick questionnaire    Fear of Current or Ex-Partner: No    Emotionally Abused: No    Physically Abused: No    Sexually Abused: No    FAMILY HISTORY: Family History  Problem Relation Age of Onset   Colon cancer Brother 10       exposure to chemicals [place]   Hypertension Mother    Stroke Mother    Kidney failure Father    Breast cancer Neg Hx    Ovarian cancer Neg Hx     ALLERGIES:  is allergic to sulfamethoxazole-trimethoprim.  MEDICATIONS:  Current Outpatient Medications  Medication Sig Dispense Refill   Cholecalciferol (VITAMIN D3) 2000 units capsule Take 2,000 Units by mouth daily.     cholestyramine (QUESTRAN) 4 g packet Take 1 packet (4 g total) by mouth 3 (three) times daily. 90 each 1   diclofenac sodium (VOLTAREN) 1 % GEL Apply 2 g topically 4 (four) times daily as needed (joint pain).  11   diphenoxylate-atropine (LOMOTIL) 2.5-0.025 MG tablet Take 1 tablet by mouth 4 (four) times daily as needed for diarrhea or loose stools. 30 tablet 1   ELIQUIS 2.5 MG TABS tablet TAKE 1 TABLET BY MOUTH TWICE  DAILY 60 tablet 11   fluticasone (FLONASE) 50 MCG/ACT nasal spray USE 1 SPRAY IN EACH NOSTRIL ONCE DAILY 16 g 3    gabapentin (NEURONTIN) 100 MG capsule Take 1 capsule (100 mg total) by mouth at bedtime. 30 capsule 0   lidocaine-prilocaine (EMLA) cream Apply 1 application. topically as needed. 30 g 6   loperamide (IMODIUM) 2 MG capsule Take 1 capsule (2 mg total) by mouth See admin instructions. With onset of loose stool, take '4mg'$  followed by '2mg'$  every 2 hours,  Maximum: 16 mg/day 120 capsule 1   loratadine (CLARITIN) 10 MG tablet Take 10 mg by mouth daily.     Multiple Vitamins-Minerals (ONE-A-DAY WOMENS 50 PLUS PO) Take 1 tablet by mouth daily.      potassium chloride SA (KLOR-CON M) 20 MEQ tablet Take 1 tablet (20 mEq total) by mouth daily. 90 tablet 0   simvastatin (ZOCOR) 40 MG tablet Take 1 tablet (40 mg total) by mouth at bedtime. 100 tablet 3   triamterene-hydrochlorothiazide (DYAZIDE) 37.5-25 MG capsule Take 1 each (1 capsule total) by  mouth daily. 100 capsule 3   zinc gluconate 50 MG tablet Take 50 mg by mouth daily.     benzonatate (TESSALON) 200 MG capsule Take 1 capsule (200 mg total) by mouth 3 (three) times daily as needed for cough. (Patient not taking: Reported on 10/10/2022) 30 capsule 0   No current facility-administered medications for this visit.   Facility-Administered Medications Ordered in Other Visits  Medication Dose Route Frequency Provider Last Rate Last Admin   0.9 %  sodium chloride infusion   Intravenous Once Earlie Server, MD       atropine injection 0.5 mg  0.5 mg Intravenous Once Earlie Server, MD       dexamethasone (DECADRON) 10 mg in sodium chloride 0.9 % 50 mL IVPB  10 mg Intravenous Once Earlie Server, MD       diphenhydrAMINE (BENADRYL) injection 25 mg  25 mg Intravenous Once Earlie Server, MD       heparin lock flush 100 unit/mL  500 Units Intracatheter Once PRN Earlie Server, MD       irinotecan (CAMPTOSAR) 180 mg in sodium chloride 0.9 % 500 mL chemo infusion  100 mg/m2 (Treatment Plan Recorded) Intravenous Once Earlie Server, MD       palonosetron (ALOXI) injection 0.25 mg  0.25 mg Intravenous  Once Earlie Server, MD       panitumumab (VECTIBIX) 400 mg in sodium chloride 0.9 % 100 mL chemo infusion  6 mg/kg (Treatment Plan Recorded) Intravenous Once Earlie Server, MD       sodium chloride flush (NS) 0.9 % injection 10 mL  10 mL Intracatheter PRN Earlie Server, MD         PHYSICAL EXAMINATION: ECOG PERFORMANCE STATUS: 1 - Symptomatic but completely ambulatory  Physical Exam Constitutional:      General: She is not in acute distress. HENT:     Head: Normocephalic and atraumatic.  Eyes:     General: No scleral icterus. Cardiovascular:     Rate and Rhythm: Normal rate and regular rhythm.     Heart sounds: Normal heart sounds.  Pulmonary:     Effort: Pulmonary effort is normal. No respiratory distress.     Breath sounds: No wheezing.  Abdominal:     General: Bowel sounds are normal. There is no distension.     Palpations: Abdomen is soft.     Comments: + Colostomy bag   Musculoskeletal:        General: No deformity. Normal range of motion.     Cervical back: Normal range of motion and neck supple.  Skin:    General: Skin is warm and dry.     Findings: No erythema or rash.  Neurological:     Mental Status: She is alert and oriented to person, place, and time. Mental status is at baseline.     Cranial Nerves: No cranial nerve deficit.     Coordination: Coordination normal.       LABORATORY DATA:  I have reviewed the data as listed    Latest Ref Rng & Units 10/24/2022    8:12 AM 10/10/2022    9:37 AM 09/25/2022    8:47 AM  CBC  WBC 4.0 - 10.5 K/uL 2.9  6.6  5.7   Hemoglobin 12.0 - 15.0 g/dL 11.2  12.0  12.2   Hematocrit 36.0 - 46.0 % 34.9  37.9  39.2   Platelets 150 - 400 K/uL 226  206  228       Latest Ref Rng &  Units 10/24/2022    8:12 AM 10/10/2022    9:37 AM 09/25/2022    8:47 AM  CMP  Glucose 70 - 99 mg/dL 101  101  95   BUN 8 - 23 mg/dL '17  17  13   '$ Creatinine 0.44 - 1.00 mg/dL 0.68  0.85  0.84   Sodium 135 - 145 mmol/L 136  135  138   Potassium 3.5 - 5.1 mmol/L  3.5  3.8  4.0   Chloride 98 - 111 mmol/L 102  104  102   CO2 22 - 32 mmol/L '28  26  27   '$ Calcium 8.9 - 10.3 mg/dL 9.0  9.1  9.1   Total Protein 6.5 - 8.1 g/dL 6.3  6.8  6.9   Total Bilirubin 0.3 - 1.2 mg/dL 0.5  0.4  0.5   Alkaline Phos 38 - 126 U/L 67  78  95   AST 15 - 41 U/L 42  29  33   ALT 0 - 44 U/L 31  16  32      RADIOGRAPHIC STUDIES: I have personally reviewed the radiological images as listed and agreed with the findings in the report. NM PET Image Restag (PS) Skull Base To Thigh  Result Date: 09/20/2022 CLINICAL DATA:  Subsequent treatment strategy for rectal cancer staging in a 77 year old female. EXAM: NUCLEAR MEDICINE PET SKULL BASE TO THIGH TECHNIQUE: 8.74 mCi F-18 FDG was injected intravenously. Full-ring PET imaging was performed from the skull base to thigh after the radiotracer. CT data was obtained and used for attenuation correction and anatomic localization. Fasting blood glucose: 82 mg/dl COMPARISON:  July 17, 2022 FINDINGS: Mediastinal blood pool activity: SUV max 1.55 Liver activity: SUV max NA NECK: No hypermetabolic lymph nodes in the neck. Incidental CT findings: None. CHEST: Scattered small pulmonary nodules largest in the medial RIGHT middle lobe (image 97/2) approximately 7 mm visible FDG uptake in this area is subtle. This dominant nodule is diminished in size compared to more remote imaging from January 19, 2021. These areas accounting for respiratory motion on the current study are not substantially changed compared to recent CT imaging. No adenopathy in the chest. Incidental CT findings: RIGHT Port-A-Cath terminates at the caval to atrial junction. No pericardial effusion. Otherwise unremarkable appearance of the heart great vessels on noncontrast imaging. Lungs are clear and airways are patent aside from pulmonary nodules discussed above. ABDOMEN/PELVIS: RIGHT groin lymph nodes (image 203/2 11 mm RIGHT inguinal lymph node with a maximum SUV of 5.13. Adjacent smaller  lymph nodes with slightly less activity. RIGHT external iliac lymph node (image 196/2) 7 mm short axis with a maximum SUV of 7.71. Incidental CT findings: No acute findings relative to liver, spleen, pancreas, adrenal glands, kidneys, stomach, small or large bowel following APR. LEFT lower quadrant colostomy as on previous imaging. Aortic atherosclerosis. SKELETON: Diffuse marrow space uptake of FDG. Subtle lucency in the RIGHT humeral head as compared to the LEFT humeral head with dramatic differential FDG uptake, more focal appearing on the RIGHT with a maximum SUV of 9.65, uptake in the marrow space of the LEFT proximal humerus is 4.80. Area of abnormality with cortical irregularity of the LEFT posterior iliac showing a maximum SUV of 4.70 similar lucent and sclerotic appearance. Relative lack of uptake in pelvic marrow spaces following radiotherapy. Incidental CT findings: Spinal degenerative changes. IMPRESSION: 1. Signs of nodal disease in the RIGHT inguinal region and RIGHT pelvis as discussed. 2. Scattered pulmonary nodules indicative of metastatic disease  with limited FDG uptake and no substantial change compared to more recent imaging. 3. No signs of solid organ disease. 4. Diffuse marrow space activity could be seen in the setting of marrow stimulation, this confounds further assessment of areas discussed above in the RIGHT proximal humerus and LEFT iliac bone in particular MRI of the RIGHT proximal humerus could be performed particularly if there are new symptoms in this location to assess for any focal underlying signs of bony metastatic disease. Morphologic changes and mild increased metabolic activity on the LEFT as compared to the RIGHT within the iliac crest remains suspicious for metastatic disease. 5. Postoperative changes of APR and LEFT lower quadrant colostomy. 6. Aortic atherosclerosis. Aortic Atherosclerosis (ICD10-I70.0). Electronically Signed   By: Zetta Bills M.D.   On: 09/20/2022 14:46       NM PET Image Restag (PS) Skull Base To Thigh  Result Date: 09/20/2022 CLINICAL DATA:  Subsequent treatment strategy for rectal cancer staging in a 77 year old female. EXAM: NUCLEAR MEDICINE PET SKULL BASE TO THIGH TECHNIQUE: 8.74 mCi F-18 FDG was injected intravenously. Full-ring PET imaging was performed from the skull base to thigh after the radiotracer. CT data was obtained and used for attenuation correction and anatomic localization. Fasting blood glucose: 82 mg/dl COMPARISON:  July 17, 2022 FINDINGS: Mediastinal blood pool activity: SUV max 1.55 Liver activity: SUV max NA NECK: No hypermetabolic lymph nodes in the neck. Incidental CT findings: None. CHEST: Scattered small pulmonary nodules largest in the medial RIGHT middle lobe (image 97/2) approximately 7 mm visible FDG uptake in this area is subtle. This dominant nodule is diminished in size compared to more remote imaging from January 19, 2021. These areas accounting for respiratory motion on the current study are not substantially changed compared to recent CT imaging. No adenopathy in the chest. Incidental CT findings: RIGHT Port-A-Cath terminates at the caval to atrial junction. No pericardial effusion. Otherwise unremarkable appearance of the heart great vessels on noncontrast imaging. Lungs are clear and airways are patent aside from pulmonary nodules discussed above. ABDOMEN/PELVIS: RIGHT groin lymph nodes (image 203/2 11 mm RIGHT inguinal lymph node with a maximum SUV of 5.13. Adjacent smaller lymph nodes with slightly less activity. RIGHT external iliac lymph node (image 196/2) 7 mm short axis with a maximum SUV of 7.71. Incidental CT findings: No acute findings relative to liver, spleen, pancreas, adrenal glands, kidneys, stomach, small or large bowel following APR. LEFT lower quadrant colostomy as on previous imaging. Aortic atherosclerosis. SKELETON: Diffuse marrow space uptake of FDG. Subtle lucency in the RIGHT humeral head as  compared to the LEFT humeral head with dramatic differential FDG uptake, more focal appearing on the RIGHT with a maximum SUV of 9.65, uptake in the marrow space of the LEFT proximal humerus is 4.80. Area of abnormality with cortical irregularity of the LEFT posterior iliac showing a maximum SUV of 4.70 similar lucent and sclerotic appearance. Relative lack of uptake in pelvic marrow spaces following radiotherapy. Incidental CT findings: Spinal degenerative changes. IMPRESSION: 1. Signs of nodal disease in the RIGHT inguinal region and RIGHT pelvis as discussed. 2. Scattered pulmonary nodules indicative of metastatic disease with limited FDG uptake and no substantial change compared to more recent imaging. 3. No signs of solid organ disease. 4. Diffuse marrow space activity could be seen in the setting of marrow stimulation, this confounds further assessment of areas discussed above in the RIGHT proximal humerus and LEFT iliac bone in particular MRI of the RIGHT proximal humerus could be performed  particularly if there are new symptoms in this location to assess for any focal underlying signs of bony metastatic disease. Morphologic changes and mild increased metabolic activity on the LEFT as compared to the RIGHT within the iliac crest remains suspicious for metastatic disease. 5. Postoperative changes of APR and LEFT lower quadrant colostomy. 6. Aortic atherosclerosis. Aortic Atherosclerosis (ICD10-I70.0). Electronically Signed   By: Zetta Bills M.D.   On: 09/20/2022 14:46

## 2022-10-24 NOTE — Assessment & Plan Note (Signed)
patient will receive long-acting GCSF prophylaxis on D3 

## 2022-10-25 LAB — CEA: CEA: 2013 ng/mL — ABNORMAL HIGH (ref 0.0–4.7)

## 2022-10-26 ENCOUNTER — Inpatient Hospital Stay: Payer: 59

## 2022-10-26 DIAGNOSIS — Z5189 Encounter for other specified aftercare: Secondary | ICD-10-CM | POA: Diagnosis not present

## 2022-10-26 DIAGNOSIS — T451X5A Adverse effect of antineoplastic and immunosuppressive drugs, initial encounter: Secondary | ICD-10-CM | POA: Diagnosis not present

## 2022-10-26 DIAGNOSIS — D6481 Anemia due to antineoplastic chemotherapy: Secondary | ICD-10-CM | POA: Diagnosis not present

## 2022-10-26 DIAGNOSIS — C7982 Secondary malignant neoplasm of genital organs: Secondary | ICD-10-CM | POA: Diagnosis not present

## 2022-10-26 DIAGNOSIS — Z86711 Personal history of pulmonary embolism: Secondary | ICD-10-CM | POA: Diagnosis not present

## 2022-10-26 DIAGNOSIS — Z7901 Long term (current) use of anticoagulants: Secondary | ICD-10-CM | POA: Diagnosis not present

## 2022-10-26 DIAGNOSIS — C7800 Secondary malignant neoplasm of unspecified lung: Secondary | ICD-10-CM | POA: Diagnosis not present

## 2022-10-26 DIAGNOSIS — D701 Agranulocytosis secondary to cancer chemotherapy: Secondary | ICD-10-CM | POA: Diagnosis not present

## 2022-10-26 DIAGNOSIS — Z86718 Personal history of other venous thrombosis and embolism: Secondary | ICD-10-CM | POA: Diagnosis not present

## 2022-10-26 DIAGNOSIS — C20 Malignant neoplasm of rectum: Secondary | ICD-10-CM

## 2022-10-26 DIAGNOSIS — Z5111 Encounter for antineoplastic chemotherapy: Secondary | ICD-10-CM | POA: Diagnosis not present

## 2022-10-26 DIAGNOSIS — Z87891 Personal history of nicotine dependence: Secondary | ICD-10-CM | POA: Diagnosis not present

## 2022-10-26 DIAGNOSIS — Z933 Colostomy status: Secondary | ICD-10-CM | POA: Diagnosis not present

## 2022-10-26 DIAGNOSIS — Z79899 Other long term (current) drug therapy: Secondary | ICD-10-CM | POA: Diagnosis not present

## 2022-10-26 DIAGNOSIS — Z5112 Encounter for antineoplastic immunotherapy: Secondary | ICD-10-CM | POA: Diagnosis not present

## 2022-10-26 DIAGNOSIS — C799 Secondary malignant neoplasm of unspecified site: Secondary | ICD-10-CM

## 2022-10-26 MED ORDER — PEGFILGRASTIM INJECTION 6 MG/0.6ML ~~LOC~~
6.0000 mg | PREFILLED_SYRINGE | Freq: Once | SUBCUTANEOUS | Status: AC
  Administered 2022-10-26: 6 mg via SUBCUTANEOUS
  Filled 2022-10-26: qty 0.6

## 2022-10-31 DIAGNOSIS — Z933 Colostomy status: Secondary | ICD-10-CM | POA: Diagnosis not present

## 2022-11-01 ENCOUNTER — Encounter: Payer: Self-pay | Admitting: Oncology

## 2022-11-02 DIAGNOSIS — Z933 Colostomy status: Secondary | ICD-10-CM | POA: Diagnosis not present

## 2022-11-04 DIAGNOSIS — Z933 Colostomy status: Secondary | ICD-10-CM | POA: Diagnosis not present

## 2022-11-05 ENCOUNTER — Other Ambulatory Visit: Payer: Self-pay | Admitting: Oncology

## 2022-11-06 NOTE — Telephone Encounter (Signed)
Component Ref Range & Units 13 d ago (10/24/22) 3 wk ago (10/10/22) 1 mo ago (09/25/22) 1 mo ago (09/11/22) 2 mo ago (08/28/22) 2 mo ago (08/14/22) 3 mo ago (07/24/22)  Potassium 3.5 - 5.1 mmol/L 3.5 3.8 4.0 3.6 4.1 4.2 4.3

## 2022-11-07 ENCOUNTER — Inpatient Hospital Stay: Payer: 59

## 2022-11-07 ENCOUNTER — Encounter: Payer: Self-pay | Admitting: Oncology

## 2022-11-07 ENCOUNTER — Inpatient Hospital Stay (HOSPITAL_BASED_OUTPATIENT_CLINIC_OR_DEPARTMENT_OTHER): Payer: 59 | Admitting: Oncology

## 2022-11-07 VITALS — BP 137/65 | HR 72 | Temp 96.8°F | Wt 154.8 lb

## 2022-11-07 VITALS — BP 123/53 | HR 66

## 2022-11-07 DIAGNOSIS — D6481 Anemia due to antineoplastic chemotherapy: Secondary | ICD-10-CM | POA: Diagnosis not present

## 2022-11-07 DIAGNOSIS — C799 Secondary malignant neoplasm of unspecified site: Secondary | ICD-10-CM

## 2022-11-07 DIAGNOSIS — Z86711 Personal history of pulmonary embolism: Secondary | ICD-10-CM | POA: Diagnosis not present

## 2022-11-07 DIAGNOSIS — Z933 Colostomy status: Secondary | ICD-10-CM | POA: Diagnosis not present

## 2022-11-07 DIAGNOSIS — C7800 Secondary malignant neoplasm of unspecified lung: Secondary | ICD-10-CM | POA: Diagnosis not present

## 2022-11-07 DIAGNOSIS — Z5111 Encounter for antineoplastic chemotherapy: Secondary | ICD-10-CM

## 2022-11-07 DIAGNOSIS — Z5189 Encounter for other specified aftercare: Secondary | ICD-10-CM | POA: Diagnosis not present

## 2022-11-07 DIAGNOSIS — T451X5A Adverse effect of antineoplastic and immunosuppressive drugs, initial encounter: Secondary | ICD-10-CM | POA: Diagnosis not present

## 2022-11-07 DIAGNOSIS — Z79899 Other long term (current) drug therapy: Secondary | ICD-10-CM | POA: Diagnosis not present

## 2022-11-07 DIAGNOSIS — Z7901 Long term (current) use of anticoagulants: Secondary | ICD-10-CM | POA: Diagnosis not present

## 2022-11-07 DIAGNOSIS — D701 Agranulocytosis secondary to cancer chemotherapy: Secondary | ICD-10-CM | POA: Diagnosis not present

## 2022-11-07 DIAGNOSIS — C7982 Secondary malignant neoplasm of genital organs: Secondary | ICD-10-CM | POA: Diagnosis not present

## 2022-11-07 DIAGNOSIS — Z87891 Personal history of nicotine dependence: Secondary | ICD-10-CM | POA: Diagnosis not present

## 2022-11-07 DIAGNOSIS — Z86718 Personal history of other venous thrombosis and embolism: Secondary | ICD-10-CM | POA: Diagnosis not present

## 2022-11-07 DIAGNOSIS — Z5112 Encounter for antineoplastic immunotherapy: Secondary | ICD-10-CM | POA: Diagnosis not present

## 2022-11-07 DIAGNOSIS — C20 Malignant neoplasm of rectum: Secondary | ICD-10-CM | POA: Diagnosis not present

## 2022-11-07 LAB — COMPREHENSIVE METABOLIC PANEL
ALT: 20 U/L (ref 0–44)
AST: 31 U/L (ref 15–41)
Albumin: 3.8 g/dL (ref 3.5–5.0)
Alkaline Phosphatase: 74 U/L (ref 38–126)
Anion gap: 9 (ref 5–15)
BUN: 11 mg/dL (ref 8–23)
CO2: 28 mmol/L (ref 22–32)
Calcium: 9.1 mg/dL (ref 8.9–10.3)
Chloride: 101 mmol/L (ref 98–111)
Creatinine, Ser: 0.73 mg/dL (ref 0.44–1.00)
GFR, Estimated: >60 mL/min (ref >60)
Glucose, Bld: 106 mg/dL — ABNORMAL HIGH (ref 70–99)
Potassium: 3.8 mmol/L (ref 3.5–5.1)
Sodium: 138 mmol/L (ref 135–145)
Total Bilirubin: 0.4 mg/dL (ref 0.3–1.2)
Total Protein: 6.6 g/dL (ref 6.5–8.1)

## 2022-11-07 LAB — CBC WITH DIFFERENTIAL/PLATELET
Abs Immature Granulocytes: 0.02 10*3/uL (ref 0.00–0.07)
Basophils Absolute: 0 10*3/uL (ref 0.0–0.1)
Basophils Relative: 0 %
Eosinophils Absolute: 0.1 10*3/uL (ref 0.0–0.5)
Eosinophils Relative: 3 %
HCT: 35.4 % — ABNORMAL LOW (ref 36.0–46.0)
Hemoglobin: 11.8 g/dL — ABNORMAL LOW (ref 12.0–15.0)
Immature Granulocytes: 0 %
Lymphocytes Relative: 29 %
Lymphs Abs: 1.3 10*3/uL (ref 0.7–4.0)
MCH: 31.3 pg (ref 26.0–34.0)
MCHC: 33.3 g/dL (ref 30.0–36.0)
MCV: 93.9 fL (ref 80.0–100.0)
Monocytes Absolute: 0.4 10*3/uL (ref 0.1–1.0)
Monocytes Relative: 8 %
Neutro Abs: 2.7 10*3/uL (ref 1.7–7.7)
Neutrophils Relative %: 60 %
Platelets: 187 10*3/uL (ref 150–400)
RBC: 3.77 MIL/uL — ABNORMAL LOW (ref 3.87–5.11)
RDW: 16.3 % — ABNORMAL HIGH (ref 11.5–15.5)
WBC: 4.5 10*3/uL (ref 4.0–10.5)
nRBC: 0 % (ref 0.0–0.2)

## 2022-11-07 LAB — MAGNESIUM: Magnesium: 2.1 mg/dL (ref 1.7–2.4)

## 2022-11-07 MED ORDER — SODIUM CHLORIDE 0.9 % IV SOLN
6.0000 mg/kg | Freq: Once | INTRAVENOUS | Status: AC
  Administered 2022-11-07: 400 mg via INTRAVENOUS
  Filled 2022-11-07: qty 20

## 2022-11-07 MED ORDER — DIPHENHYDRAMINE HCL 50 MG/ML IJ SOLN
25.0000 mg | Freq: Once | INTRAMUSCULAR | Status: AC
  Administered 2022-11-07: 25 mg via INTRAVENOUS
  Filled 2022-11-07: qty 1

## 2022-11-07 MED ORDER — SODIUM CHLORIDE 0.9 % IV SOLN
Freq: Once | INTRAVENOUS | Status: AC
  Filled 2022-11-07: qty 250

## 2022-11-07 MED ORDER — HEPARIN SOD (PORK) LOCK FLUSH 100 UNIT/ML IV SOLN
INTRAVENOUS | Status: AC
  Administered 2022-11-07: 500 [IU]
  Filled 2022-11-07: qty 5

## 2022-11-07 MED ORDER — HEPARIN SOD (PORK) LOCK FLUSH 100 UNIT/ML IV SOLN
500.0000 [IU] | Freq: Once | INTRAVENOUS | Status: AC | PRN
  Filled 2022-11-07: qty 5

## 2022-11-07 MED ORDER — SODIUM CHLORIDE 0.9 % IV SOLN
100.0000 mg/m2 | Freq: Once | INTRAVENOUS | Status: AC
  Administered 2022-11-07: 180 mg via INTRAVENOUS
  Filled 2022-11-07: qty 4

## 2022-11-07 MED ORDER — ATROPINE SULFATE 1 MG/ML IV SOLN
0.5000 mg | Freq: Once | INTRAVENOUS | Status: AC
  Administered 2022-11-07: 0.5 mg via INTRAVENOUS
  Filled 2022-11-07: qty 1

## 2022-11-07 MED ORDER — PALONOSETRON HCL INJECTION 0.25 MG/5ML
0.2500 mg | Freq: Once | INTRAVENOUS | Status: AC
  Administered 2022-11-07: 0.25 mg via INTRAVENOUS
  Filled 2022-11-07: qty 5

## 2022-11-07 MED ORDER — SODIUM CHLORIDE 0.9 % IV SOLN
10.0000 mg | Freq: Once | INTRAVENOUS | Status: AC
  Administered 2022-11-07: 10 mg via INTRAVENOUS
  Filled 2022-11-07: qty 10

## 2022-11-07 NOTE — Progress Notes (Signed)
Hematology/Oncology Progress note [phone] [phone]      Patient Care Team: Olin Hauser, DO as PCP - General (Family Medicine) Clent Jacks, RN as Registered Nurse Earlie Server, MD as Consulting Physician (Hematology and Oncology) Cammie Sickle, MD as Consulting Physician (Oncology)  ASSESSMENT & PLAN:   Cancer Staging  Rectal cancer [facility]) Staging form: Colon and Rectum, AJCC 8th Edition - Pathologic stage from 10/06/2019: Stage IIC (ypT4b, pN0, cM0) - Signed by Earlie Server, MD on 10/06/2019 - Clinical stage from 06/30/2020: [name] - Signed by Earlie Server, MD on 04/17/2022 - Pathologic: Stage Unknown (rpTX, pNX, cM1) - Signed by Earlie Server, MD on 01/31/2021   Rectal cancer [name]) History of stage IIIC Rectal cancer, s/p TNT, followed by 09/17/19 APR/posterior vaginectomy/TAH/BSO/VY-flap, pT4b pN0 with close vaginal margin 0.2 mm.  Uterus and ovaries negative for malignancy. palliative radiation to vaginal recurrence- 01/19/21 recurrence with lung metastasis.-Palliative -FOLFIRI plus bevacizumab.  Irinotecan was dropped in November 2022 due to side effects. Negative for UGT1A1*28 - radiographically stable, rise of CEA-July 2023 CT lung metastasis worse--> Dec 2023  PET showed progression in pelvic lymph nodes and bone lesions, switch to irinotecan +panitumumab Labs are reviewed and discussed with patient. She tolerates well.  Proceed with  irinotecan +panitumumab Day 3 GCSF due to neutropenia  Anemia due to antineoplastic chemotherapy Hb is stable. observation  Chemotherapy induced neutropenia ([place]) patient will receive long-acting GCSF prophylaxis on D3  Encounter for antineoplastic chemotherapy Chemotherapy plan as listed above.   History of pulmonary embolism Continue  Eliquis 2.5 mg twice daily for anticoagulation prophylaxis.   Follow up 2 weeks Lab MD irinotecan + panitumumab All questions were answered. The patient knows to  call the clinic with any problems, questions or concerns.  Earlie Server, MD, PhD [facility] Hematology Oncology 11/07/2022      CHIEF COMPLAINTS/REASON FOR VISIT:  Follow up for rectal cancer  HISTORY OF PRESENTING ILLNESS:  Patient initially presented with complaints of postmenopausal bleeding on 08/16/2018.  History of was menopausal vaginal bleeding in 2016 which resulted in cervical polypectomy.  Pathology 02/04/2015 showed cervical polyp, consistent with benign endometrial polyp.  [name] after polypectomy due to anxiety associated with pelvic exams.  pelvic exam on 08/16/2018 reviewed cervical abnormality and from enlarged uterus. Seen by Dr. Marcelline Mates on 10/29/2018.  Endometrial biopsy and a Pap smear was performed. 10/29/2018 Pap smear showed adenocarcinoma, favor endometrial origin. 10/29/2018 endometrial biopsy showed endometrioid carcinoma, FIGO grade 1.  10/29/2018- TA & TV Ultrasound revealed: Anteverted uterus measuring 8.7 x 5.6 x 6.4 cm without evidence of focal masses.  The endometrium measuring 24.1 mm (thickened) and heterogeneous.  Right and left ovaries not visualized.  No adnexal masses identified.  No free fluid in cul-de-sac.  Patient was seen by Dr. Theora Gianotti in clinic on 11/13/2018.  Cervical exam reveals 2 cm exophytic irregular mass consistent with malignancy.   11/19/2018 CT chest abdomen pelvis with contrast showed thickened endometrium with some irregularity compatible with the provided diagnosis of endometrial malignancy.  There is a mildly prominent left inguinal node 1.4 cm.  Patient was seen by Dr. Fransisca Connors on 11/20/2018 and left groin lymph node biopsy was recommended.  11/26/2018 patient underwent left inguinal lymph node biopsy. Pathology showed metastatic adenocarcinoma consistent with colorectal origin.  CDX 2+.  Case was discussed on tumor board.  Recommend colonoscopy for further evaluation.  Patient reports significant weight loss 30 pounds over the  last year.  Chronic vaginal  spotting. Change of bowel habits the past few months.  More constipated.  Family history positive for brother who has colon cancer prostate cancer.  patient has underwent colonoscopy on 12/03/2018 which reviewed a nonobstructing large mass in the rectum.  Also chronic fistula.  Mass was not circumferential.  This was biopsied with a cold forceps for histology.  Pathology came back hyperplastic polyp negative for dysplasia and malignancy. [name], patient underwent flex sigmoidoscopy on 12/06/2018 with rebiopsy of the rectal mass. This time biopsy results came back positive for invasive colorectal adenocarcinoma, moderately differentiated. Immunotherapy for nearly mismatch repair protein (MMR ) was performed.  There is no loss of MMR expression.  low probability of MSI high.   # Seen by [name] for evaluation of resectability for rectal cancer. In addition, she also had a second opinion with [facility] pathology where her endometrial biopsy pathology was changed to  adenocarcinoma, consistent with colorectal primary.   [name]. She has home health that has been assisting with ostomy care  Patient was also evaluated by [facility] oncology.  Recommendation is to proceed with TNT with concurrent chemoradiation followed by neoadjuvant chemotherapy followed by surgical resection. Patient prefers to have treatment done locally with [facility].   # Oncology Treatment:  02/03/2019- 03/19/2019  concurrent Xeloda and radiation.  Xeloda dose '825mg'$  /m2 BID - rounded to '1650mg'$  BID- on days of radiation. 04/09/2019, started on FOLFOX with bolus early.  Omitted.  07/16/2019 finished 8 cycles of FOLFOX. 09/17/19 APR/posterior vaginectomy/TAH/BSO/VY-flap pT4b pN0 with close vaginal margin 0.2 mm.  Uterus and ovaries negative for malignancy. Patient reports bilateral lower extremity numbness and tingling, intermittent, left worse than  right. She has lost a lot of weight since her APR surgery.   #Family history with half brother having's history of colon cancer prostate cancer.  Personal history of colorectal cancer.  Patient has not decided if she wants genetic testing.    # history of PE( 01/13/2020)  in the bilateral lower extremity DVT (01/13/2020).   She finishes 6 months of anticoagulation with Eliquis 5 mg twice daily. Now switched to Eliquis 2.5 mg twice daily..  # She has now developed recurrent disease. #06/30/20  vaginal introitus mass biopsied. Pathology is consistent with metastatic colorectal adenocarcinoma I have discussed with [name]  Dr. Hester Mates and the mass is not resectable. Patient has also had colonoscopy by Dr. Vicente Males yesterday. Normal examination. # 07/16/2020 cycle 1 FOLFIRI  # 07/20/2020 PET scan was done for further evaluation, images are consistent with local recurrence, no distant metastasis. #Discussed with radiation oncology Dr. Baruch Gouty will recommends concurrent chemotherapy and radiation. 08/02/2020-08/16/2020, patient starts radiation.  Xeloda was held due to neutropenia 08/17/2020,-09/06/2020 Xeloda 1500 mg twice daily concurrently with radiation  01/31/21 started on FOLFIRI + Bev 05/18/2021 CT chest abdomen pelvis showed Previously noted enlargement of bilateral inguinal lymph nodes is resolved, consistent with treatment response of nodal metastatic disease. Interval decrease in size of multiple small bilateral pulmonary nodules, consistent with treatment response of pulmonary metastatic disease. No evidence of new metastatic disease. 05/24/2021 - 08/30/2021, continued on FOLFIRI plus bevacizumab.  Irinotecan dose was reduced, eventually '100mg'$ /m2  09/02/2021, CT chest abdomen pelvis without contrast Showed small bilateral pulmonary nodules, unchanged.  Stable metastatic disease.  No noncontrast evidence of new metastatic disease in the chest abdomen pelvis.  Small parastomal hernia.  Enlargement of  main pulmonary artery.  Coronary artery disease.  09/13/2021, maintenance 5-FU/bevacizumab 11/28/2021, 5-FU/Irinotecan/bevacizumab.  Irinotecan  100 mg/m2 was added back due to progressively increasing CEA.  INTERVAL HISTORY Jacqueline Yoder is a 77 y.o. female who has above history reviewed by me presents for follow-up of rectal cancer. On Irinotecan and panitumumab. she tolerates well. No significant increase of ostomy output, Denies any nausea vomiting abdominal pain or skin rash.  + left hip pain. She takes Tylenol PRN  Weight remains stable comparing to last visit.     Review of Systems  Constitutional:  Positive for fatigue. Negative for appetite change, chills, fever and unexpected weight change.  HENT:   Negative for hearing loss and voice change.   Eyes:  Negative for eye problems.  Respiratory:  Negative for chest tightness and cough.   Cardiovascular:  Negative for chest pain.  Gastrointestinal:  Negative for abdominal distention, abdominal pain, blood in stool, constipation, diarrhea and nausea.  Endocrine: Negative for hot flashes.  Genitourinary:  Negative for difficulty urinating and frequency.   Musculoskeletal:  Positive for arthralgias.  Skin:  Negative for itching and rash.  Neurological:  Negative for extremity weakness and numbness.  Hematological:  Negative for adenopathy.  Psychiatric/Behavioral:  Negative for confusion.     MEDICAL HISTORY:  Past Medical History:  Diagnosis Date   Allergy    Arthritis    Blood clot in vein    Family history of colon cancer    GERD (gastroesophageal reflux disease)    Hypercholesteremia    Hypertension    Hypertension    Lower extremity edema    Personal history of chemotherapy    Rectal cancer ([place]) 12/2018   Urinary incontinence     SURGICAL HISTORY: Past Surgical History:  Procedure Laterality Date   ABDOMINAL HYSTERECTOMY     CHOLECYSTECTOMY  1971   COLONOSCOPY WITH PROPOFOL N/A 12/03/2018   Procedure:  COLONOSCOPY WITH PROPOFOL;  Surgeon: Lucilla Lame, MD;  Location: [facility] ENDOSCOPY;  Service: Endoscopy;  Laterality: N/A;   COLONOSCOPY WITH PROPOFOL N/A 07/15/2020   Procedure: COLONOSCOPY WITH PROPOFOL;  Surgeon: [name], MD;  Location: [facility] ENDOSCOPY;  Service: Gastroenterology;  Laterality: N/A;   FLEXIBLE SIGMOIDOSCOPY N/A 12/06/2018   Procedure: FLEXIBLE SIGMOIDOSCOPY;  Surgeon: [name], MD;  Location: [facility] ENDOSCOPY;  Service: Endoscopy;  Laterality: N/A;   LAPAROSCOPIC COLOSTOMY  01/06/2019   PORTACATH PLACEMENT N/A 04/03/2019   Procedure: INSERTION PORT-A-CATH;  Surgeon: Jules Husbands, MD;  Location: [facility] ORS;  Service: General;  Laterality: N/A;    SOCIAL HISTORY: Social History   Socioeconomic History   Marital status: Widowed    Spouse name: Not on file   Number of children: Not on file   Years of education: Not on file   Highest education level: Not on file  Occupational History   Not on file  Tobacco Use   Smoking status: Former    Types: Cigarettes    Quit date: 12/02/1977    Years since quitting: 44.9   Smokeless tobacco: Former  Scientific laboratory technician Use: Never used  Substance and Sexual Activity   Alcohol use: Never   Drug use: Never   Sexual activity: Not Currently    Birth control/protection: None  Other Topics Concern   Not on file  Social History Narrative   Lives with daughter   Social Determinants of Health   Financial Resource Strain: Medium Risk (06/30/2022)   Overall Financial Resource Strain (CARDIA)    Difficulty of Paying Living Expenses: Somewhat hard  Food Insecurity: No Food Insecurity (06/30/2022)   Hunger Vital  Sign    Worried About Charity fundraiser in the Last Year: Never true    [place] in the Last Year: Never true  Transportation Needs: No Transportation Needs (06/30/2022)   PRAPARE - Hydrologist (Medical): No    Lack of Transportation (Non-Medical): No  Physical Activity: Inactive  (06/30/2022)   Exercise Vital Sign    Days of Exercise per Week: 0 days    Minutes of Exercise per Session: 0 min  Stress: No Stress Concern Present (06/30/2022)   [place]    Feeling of Stress : Not at all  Social Connections: Moderately Isolated (06/30/2022)   Social Connection and Isolation Panel [NHANES]    Frequency of Communication with Friends and Family: More than three times a week    Frequency of Social Gatherings with Friends and Family: Twice a week    Attends Religious Services: More than 4 times per year    Active Member of [employer] or Organizations: No    Attends Archivist Meetings: Never    Marital Status: Widowed  Intimate Partner Violence: Not At Risk (06/30/2022)   Humiliation, Afraid, Rape, and Kick questionnaire    Fear of Current or Ex-Partner: No    Emotionally Abused: No    Physically Abused: No    Sexually Abused: No    FAMILY HISTORY: Family History  Problem Relation Age of Onset   Colon cancer Brother 26       exposure to chemicals [place]   Hypertension Mother    Stroke Mother    Kidney failure Father    Breast cancer Neg Hx    Ovarian cancer Neg Hx     ALLERGIES:  is allergic to sulfamethoxazole-trimethoprim.  MEDICATIONS:  Current Outpatient Medications  Medication Sig Dispense Refill   Cholecalciferol (VITAMIN D3) 2000 units capsule Take 2,000 Units by mouth daily.     cholestyramine (QUESTRAN) 4 g packet Take 1 packet (4 g total) by mouth 3 (three) times daily. 90 each 1   diclofenac sodium (VOLTAREN) 1 % GEL Apply 2 g topically 4 (four) times daily as needed (joint pain).  11   diphenoxylate-atropine (LOMOTIL) 2.5-0.025 MG tablet Take 1 tablet by mouth 4 (four) times daily as needed for diarrhea or loose stools. 30 tablet 1   ELIQUIS 2.5 MG TABS tablet TAKE 1 TABLET BY MOUTH TWICE  DAILY 60 tablet 11   fluticasone (FLONASE) 50 MCG/ACT nasal spray USE 1 SPRAY IN EACH  NOSTRIL ONCE DAILY 16 g 3   gabapentin (NEURONTIN) 100 MG capsule Take 1 capsule (100 mg total) by mouth at bedtime. 30 capsule 0   lidocaine-prilocaine (EMLA) cream Apply 1 application. topically as needed. 30 g 6   loperamide (IMODIUM) 2 MG capsule Take 1 capsule (2 mg total) by mouth See admin instructions. With onset of loose stool, take '4mg'$  followed by '2mg'$  every 2 hours,  Maximum: 16 mg/day 120 capsule 1   loratadine (CLARITIN) 10 MG tablet Take 10 mg by mouth daily.     Multiple Vitamins-Minerals (ONE-A-DAY WOMENS 50 PLUS PO) Take 1 tablet by mouth daily.      potassium chloride SA (KLOR-CON M) 20 MEQ tablet Take 1 tablet (20 mEq total) by mouth daily. 90 tablet 0   simvastatin (ZOCOR) 40 MG tablet Take 1 tablet (40 mg total) by mouth at bedtime. 100 tablet 3   triamterene-hydrochlorothiazide (DYAZIDE) 37.5-25 MG capsule Take 1 each (  1 capsule total) by mouth daily. 100 capsule 3   zinc gluconate 50 MG tablet Take 50 mg by mouth daily.     benzonatate (TESSALON) 200 MG capsule Take 1 capsule (200 mg total) by mouth 3 (three) times daily as needed for cough. (Patient not taking: Reported on 10/10/2022) 30 capsule 0   No current facility-administered medications for this visit.   Facility-Administered Medications Ordered in Other Visits  Medication Dose Route Frequency Provider Last Rate Last Admin   diphenhydrAMINE (BENADRYL) injection 25 mg  25 mg Intravenous Once Earlie Server, MD       heparin lock flush 100 UNIT/ML injection            heparin lock flush 100 unit/mL  500 Units Intracatheter Once PRN Earlie Server, MD       irinotecan (CAMPTOSAR) 180 mg in sodium chloride 0.9 % 500 mL chemo infusion  100 mg/m2 (Treatment Plan Recorded) Intravenous Once Earlie Server, MD         PHYSICAL EXAMINATION: ECOG PERFORMANCE STATUS: 1 - Symptomatic but completely ambulatory  Physical Exam Constitutional:      General: She is not in acute distress. HENT:     Head: Normocephalic and atraumatic.  Eyes:      General: No scleral icterus. Cardiovascular:     Rate and Rhythm: Normal rate and regular rhythm.     Heart sounds: Normal heart sounds.  Pulmonary:     Effort: Pulmonary effort is normal. No respiratory distress.     Breath sounds: No wheezing.  Abdominal:     General: Bowel sounds are normal. There is no distension.     Palpations: Abdomen is soft.     Comments: + Colostomy bag   Musculoskeletal:        General: No deformity. Normal range of motion.     Cervical back: Normal range of motion and neck supple.  Skin:    General: Skin is warm and dry.     Findings: No erythema or rash.  Neurological:     Mental Status: She is alert and oriented to person, place, and time. Mental status is at baseline.     Cranial Nerves: No cranial nerve deficit.     Coordination: Coordination normal.       LABORATORY DATA:  I have reviewed the data as listed    Latest Ref Rng & Units 11/07/2022    7:47 AM 10/24/2022    8:12 AM 10/10/2022    9:37 AM  CBC  WBC 4.0 - 10.5 K/uL 4.5  2.9  6.6   Hemoglobin 12.0 - 15.0 g/dL 11.8  11.2  12.0   Hematocrit 36.0 - 46.0 % 35.4  34.9  37.9   Platelets 150 - 400 K/uL 187  226  206       Latest Ref Rng & Units 11/07/2022    7:47 AM 10/24/2022    8:12 AM 10/10/2022    9:37 AM  CMP  Glucose 70 - 99 mg/dL 106  101  101   BUN 8 - 23 mg/dL '11  17  17   '$ Creatinine 0.44 - 1.00 mg/dL 0.73  0.68  0.85   Sodium 135 - 145 mmol/L 138  136  135   Potassium 3.5 - 5.1 mmol/L 3.8  3.5  3.8   Chloride 98 - 111 mmol/L 101  102  104   CO2 22 - 32 mmol/L '28  28  26   '$ Calcium 8.9 - 10.3 mg/dL 9.1  9.0  9.1   Total Protein 6.5 - 8.1 g/dL 6.6  6.3  6.8   Total Bilirubin 0.3 - 1.2 mg/dL 0.4  0.5  0.4   Alkaline Phos 38 - 126 U/L 74  67  78   AST 15 - 41 U/L 31  42  29   ALT 0 - 44 U/L '20  31  16      '$ RADIOGRAPHIC STUDIES: I have personally reviewed the radiological images as listed and agreed with the findings in the report. NM PET Image Restag (PS) Skull Base  To Thigh  Result Date: 09/20/2022 CLINICAL DATA:  Subsequent treatment strategy for rectal cancer staging in a 77 year old female. EXAM: NUCLEAR MEDICINE PET SKULL BASE TO THIGH TECHNIQUE: 8.74 mCi F-18 FDG was injected intravenously. Full-ring PET imaging was performed from the skull base to thigh after the radiotracer. CT data was obtained and used for attenuation correction and anatomic localization. Fasting blood glucose: 82 mg/dl COMPARISON:  July 17, 2022 FINDINGS: Mediastinal blood pool activity: SUV max 1.55 Liver activity: SUV max NA NECK: No hypermetabolic lymph nodes in the neck. Incidental CT findings: None. CHEST: Scattered small pulmonary nodules largest in the medial RIGHT middle lobe (image 97/2) approximately 7 mm visible FDG uptake in this area is subtle. This dominant nodule is diminished in size compared to more remote imaging from January 19, 2021. These areas accounting for respiratory motion on the current study are not substantially changed compared to recent CT imaging. No adenopathy in the chest. Incidental CT findings: RIGHT Port-A-Cath terminates at the caval to atrial junction. No pericardial effusion. Otherwise unremarkable appearance of the heart great vessels on noncontrast imaging. Lungs are clear and airways are patent aside from pulmonary nodules discussed above. ABDOMEN/PELVIS: RIGHT groin lymph nodes (image 203/2 11 mm RIGHT inguinal lymph node with a maximum SUV of 5.13. Adjacent smaller lymph nodes with slightly less activity. RIGHT external iliac lymph node (image 196/2) 7 mm short axis with a maximum SUV of 7.71. Incidental CT findings: No acute findings relative to liver, spleen, pancreas, adrenal glands, kidneys, stomach, small or large bowel following APR. LEFT lower quadrant colostomy as on previous imaging. Aortic atherosclerosis. SKELETON: Diffuse marrow space uptake of FDG. Subtle lucency in the RIGHT humeral head as compared to the LEFT humeral head with dramatic  differential FDG uptake, more focal appearing on the RIGHT with a maximum SUV of 9.65, uptake in the marrow space of the LEFT proximal humerus is 4.80. Area of abnormality with cortical irregularity of the LEFT posterior iliac showing a maximum SUV of 4.70 similar lucent and sclerotic appearance. Relative lack of uptake in pelvic marrow spaces following radiotherapy. Incidental CT findings: Spinal degenerative changes. IMPRESSION: 1. Signs of nodal disease in the RIGHT inguinal region and RIGHT pelvis as discussed. 2. Scattered pulmonary nodules indicative of metastatic disease with limited FDG uptake and no substantial change compared to more recent imaging. 3. No signs of solid organ disease. 4. Diffuse marrow space activity could be seen in the setting of marrow stimulation, this confounds further assessment of areas discussed above in the RIGHT proximal humerus and LEFT iliac bone in particular MRI of the RIGHT proximal humerus could be performed particularly if there are new symptoms in this location to assess for any focal underlying signs of bony metastatic disease. Morphologic changes and mild increased metabolic activity on the LEFT as compared to the RIGHT within the iliac crest remains suspicious for metastatic disease. 5. Postoperative changes of APR and LEFT lower quadrant  colostomy. 6. Aortic atherosclerosis. Aortic Atherosclerosis (ICD10-I70.0). Electronically Signed   By: Zetta Bills M.D.   On: 09/20/2022 14:46      NM PET Image Restag (PS) Skull Base To Thigh  Result Date: 09/20/2022 CLINICAL DATA:  Subsequent treatment strategy for rectal cancer staging in a 77 year old female. EXAM: NUCLEAR MEDICINE PET SKULL BASE TO THIGH TECHNIQUE: 8.74 mCi F-18 FDG was injected intravenously. Full-ring PET imaging was performed from the skull base to thigh after the radiotracer. CT data was obtained and used for attenuation correction and anatomic localization. Fasting blood glucose: 82 mg/dl  COMPARISON:  July 17, 2022 FINDINGS: Mediastinal blood pool activity: SUV max 1.55 Liver activity: SUV max NA NECK: No hypermetabolic lymph nodes in the neck. Incidental CT findings: None. CHEST: Scattered small pulmonary nodules largest in the medial RIGHT middle lobe (image 97/2) approximately 7 mm visible FDG uptake in this area is subtle. This dominant nodule is diminished in size compared to more remote imaging from January 19, 2021. These areas accounting for respiratory motion on the current study are not substantially changed compared to recent CT imaging. No adenopathy in the chest. Incidental CT findings: RIGHT Port-A-Cath terminates at the caval to atrial junction. No pericardial effusion. Otherwise unremarkable appearance of the heart great vessels on noncontrast imaging. Lungs are clear and airways are patent aside from pulmonary nodules discussed above. ABDOMEN/PELVIS: RIGHT groin lymph nodes (image 203/2 11 mm RIGHT inguinal lymph node with a maximum SUV of 5.13. Adjacent smaller lymph nodes with slightly less activity. RIGHT external iliac lymph node (image 196/2) 7 mm short axis with a maximum SUV of 7.71. Incidental CT findings: No acute findings relative to liver, spleen, pancreas, adrenal glands, kidneys, stomach, small or large bowel following APR. LEFT lower quadrant colostomy as on previous imaging. Aortic atherosclerosis. SKELETON: Diffuse marrow space uptake of FDG. Subtle lucency in the RIGHT humeral head as compared to the LEFT humeral head with dramatic differential FDG uptake, more focal appearing on the RIGHT with a maximum SUV of 9.65, uptake in the marrow space of the LEFT proximal humerus is 4.80. Area of abnormality with cortical irregularity of the LEFT posterior iliac showing a maximum SUV of 4.70 similar lucent and sclerotic appearance. Relative lack of uptake in pelvic marrow spaces following radiotherapy. Incidental CT findings: Spinal degenerative changes. IMPRESSION: 1. Signs  of nodal disease in the RIGHT inguinal region and RIGHT pelvis as discussed. 2. Scattered pulmonary nodules indicative of metastatic disease with limited FDG uptake and no substantial change compared to more recent imaging. 3. No signs of solid organ disease. 4. Diffuse marrow space activity could be seen in the setting of marrow stimulation, this confounds further assessment of areas discussed above in the RIGHT proximal humerus and LEFT iliac bone in particular MRI of the RIGHT proximal humerus could be performed particularly if there are new symptoms in this location to assess for any focal underlying signs of bony metastatic disease. Morphologic changes and mild increased metabolic activity on the LEFT as compared to the RIGHT within the iliac crest remains suspicious for metastatic disease. 5. Postoperative changes of APR and LEFT lower quadrant colostomy. 6. Aortic atherosclerosis. Aortic Atherosclerosis (ICD10-I70.0). Electronically Signed   By: Zetta Bills M.D.   On: 09/20/2022 14:46

## 2022-11-07 NOTE — Assessment & Plan Note (Signed)
Chemotherapy plan as listed above 

## 2022-11-07 NOTE — Assessment & Plan Note (Signed)
Hb is stable. observation 

## 2022-11-07 NOTE — Assessment & Plan Note (Addendum)
History of stage IIIC Rectal cancer, s/p TNT, followed by 09/17/19 APR/posterior vaginectomy/TAH/BSO/VY-flap, pT4b pN0 with close vaginal margin 0.2 mm.  Uterus and ovaries negative for malignancy. palliative radiation to vaginal recurrence- 01/19/21 recurrence with lung metastasis.-Palliative -FOLFIRI plus bevacizumab.  Irinotecan was dropped in November 2022 due to side effects. Negative for UGT1A1*28 - radiographically stable, rise of CEA-July 2023 CT lung metastasis worse--> Dec 2023  PET showed progression in pelvic lymph nodes and bone lesions, switch to irinotecan +panitumumab Labs are reviewed and discussed with patient. She tolerates well.  Proceed with  irinotecan +panitumumab Day 3 GCSF due to neutropenia

## 2022-11-07 NOTE — Assessment & Plan Note (Signed)
Continue  Eliquis 2.5 mg twice daily for anticoagulation prophylaxis. 

## 2022-11-07 NOTE — Patient Instructions (Signed)
[facility]  Discharge Instructions: Thank you for choosing [facility] to provide your oncology and hematology care.  If you have a lab appointment with the [facility], please go directly to the [facility] and check in at the registration area.  Wear comfortable clothing and clothing appropriate for easy access to any Portacath or PICC line.   We strive to give you quality time with your provider. You may need to reschedule your appointment if you arrive late (15 or more minutes).  Arriving late affects you and other patients whose appointments are after yours.  Also, if you miss three or more appointments without notifying the office, you may be dismissed from the clinic at the provider's discretion.      For prescription refill requests, have your pharmacy contact our office and allow 72 hours for refills to be completed.    Today you received the following chemotherapy and/or immunotherapy agents Irinotecan and Vectibix    To help prevent nausea and vomiting after your treatment, we encourage you to take your nausea medication as directed.  BELOW ARE SYMPTOMS THAT SHOULD BE REPORTED IMMEDIATELY: *FEVER GREATER THAN 100.4 F (38 C) OR HIGHER *CHILLS OR SWEATING *NAUSEA AND VOMITING THAT IS NOT CONTROLLED WITH YOUR NAUSEA MEDICATION *UNUSUAL SHORTNESS OF BREATH *UNUSUAL BRUISING OR BLEEDING *URINARY PROBLEMS (pain or burning when urinating, or frequent urination) *BOWEL PROBLEMS (unusual diarrhea, constipation, pain near the anus) TENDERNESS IN MOUTH AND THROAT WITH OR WITHOUT PRESENCE OF ULCERS (sore throat, sores in mouth, or a toothache) UNUSUAL RASH, SWELLING OR PAIN  UNUSUAL VAGINAL DISCHARGE OR ITCHING   Items with * indicate a potential emergency and should be followed up as soon as possible or go to the Emergency Department if any problems should occur.  Please show the CHEMOTHERAPY ALERT CARD or IMMUNOTHERAPY ALERT CARD at  check-in to the Emergency Department and triage nurse.  Should you have questions after your visit or need to cancel or reschedule your appointment, please contact [facility]  [phone] and follow the prompts.  Office hours are 8:00 a.m. to 4:30 p.m. Monday - Friday. Please note that voicemails left after 4:00 p.m. may not be returned until the following business day.  We are closed weekends and major holidays. You have access to a nurse at all times for urgent questions. Please call the main number to the clinic [phone] and follow the prompts.  For any non-urgent questions, you may also contact your provider using MyChart. We now offer e-Visits for anyone 32 and older to request care online for non-urgent symptoms. For details visit [email or web address].   Also download the MyChart app! Go to the app store, search "MyChart", open the app, select [facility], and log in with your MyChart username and password.

## 2022-11-07 NOTE — Assessment & Plan Note (Signed)
patient will receive long-acting GCSF prophylaxis on D3 

## 2022-11-08 LAB — CEA: CEA: 1249 ng/mL — ABNORMAL HIGH (ref 0.0–4.7)

## 2022-11-09 ENCOUNTER — Inpatient Hospital Stay: Payer: 59

## 2022-11-09 DIAGNOSIS — Z5111 Encounter for antineoplastic chemotherapy: Secondary | ICD-10-CM | POA: Diagnosis not present

## 2022-11-09 DIAGNOSIS — C7982 Secondary malignant neoplasm of genital organs: Secondary | ICD-10-CM | POA: Diagnosis not present

## 2022-11-09 DIAGNOSIS — T451X5A Adverse effect of antineoplastic and immunosuppressive drugs, initial encounter: Secondary | ICD-10-CM | POA: Diagnosis not present

## 2022-11-09 DIAGNOSIS — Z87891 Personal history of nicotine dependence: Secondary | ICD-10-CM | POA: Diagnosis not present

## 2022-11-09 DIAGNOSIS — D6481 Anemia due to antineoplastic chemotherapy: Secondary | ICD-10-CM | POA: Diagnosis not present

## 2022-11-09 DIAGNOSIS — Z7901 Long term (current) use of anticoagulants: Secondary | ICD-10-CM | POA: Diagnosis not present

## 2022-11-09 DIAGNOSIS — C799 Secondary malignant neoplasm of unspecified site: Secondary | ICD-10-CM

## 2022-11-09 DIAGNOSIS — C20 Malignant neoplasm of rectum: Secondary | ICD-10-CM | POA: Diagnosis not present

## 2022-11-09 DIAGNOSIS — C7800 Secondary malignant neoplasm of unspecified lung: Secondary | ICD-10-CM | POA: Diagnosis not present

## 2022-11-09 DIAGNOSIS — Z5112 Encounter for antineoplastic immunotherapy: Secondary | ICD-10-CM | POA: Diagnosis not present

## 2022-11-09 DIAGNOSIS — Z933 Colostomy status: Secondary | ICD-10-CM | POA: Diagnosis not present

## 2022-11-09 DIAGNOSIS — D701 Agranulocytosis secondary to cancer chemotherapy: Secondary | ICD-10-CM | POA: Diagnosis not present

## 2022-11-09 DIAGNOSIS — Z79899 Other long term (current) drug therapy: Secondary | ICD-10-CM | POA: Diagnosis not present

## 2022-11-09 DIAGNOSIS — Z86718 Personal history of other venous thrombosis and embolism: Secondary | ICD-10-CM | POA: Diagnosis not present

## 2022-11-09 DIAGNOSIS — Z5189 Encounter for other specified aftercare: Secondary | ICD-10-CM | POA: Diagnosis not present

## 2022-11-09 DIAGNOSIS — Z86711 Personal history of pulmonary embolism: Secondary | ICD-10-CM | POA: Diagnosis not present

## 2022-11-09 MED ORDER — PEGFILGRASTIM INJECTION 6 MG/0.6ML ~~LOC~~
6.0000 mg | PREFILLED_SYRINGE | Freq: Once | SUBCUTANEOUS | Status: AC
  Administered 2022-11-09: 6 mg via SUBCUTANEOUS
  Filled 2022-11-09: qty 0.6

## 2022-11-16 ENCOUNTER — Encounter (INDEPENDENT_AMBULATORY_CARE_PROVIDER_SITE_OTHER): Payer: Medicare Other | Admitting: Family Medicine

## 2022-11-16 ENCOUNTER — Other Ambulatory Visit: Payer: Self-pay | Admitting: Oncology

## 2022-11-16 DIAGNOSIS — L209 Atopic dermatitis, unspecified: Secondary | ICD-10-CM

## 2022-11-16 DIAGNOSIS — I825Z3 Chronic embolism and thrombosis of unspecified deep veins of distal lower extremity, bilateral: Secondary | ICD-10-CM

## 2022-11-16 DIAGNOSIS — Z7901 Long term (current) use of anticoagulants: Secondary | ICD-10-CM

## 2022-11-17 MED ORDER — TRIAMCINOLONE ACETONIDE 0.5 % EX CREA: 1.0000 | TOPICAL_CREAM | Freq: Two times a day (BID) | CUTANEOUS | 2 refills | Status: DC

## 2022-11-17 NOTE — Telephone Encounter (Signed)
Please see the MyChart message reply(ies) for my assessment and plan.    This patient gave consent for this Medical Advice Message and is aware that it may result in a bill to their insurance company, as well as the possibility of receiving a bill for a co-payment or deductible. They are an established patient, but are not seeking medical advice exclusively about a problem treated during an in person or video visit in the last seven days. I did not recommend an in person or video visit within seven days of my reply.    I spent a total of 7 minutes cumulative time within 7 days through MyChart messaging.  Jamarien Rodkey, DO   

## 2022-11-17 NOTE — Addendum Note (Signed)
Addended by: Olin Hauser on: 11/17/2022 05:45 PM   Modules accepted: Orders

## 2022-11-20 MED FILL — Dexamethasone Sodium Phosphate Inj 100 MG/10ML: INTRAMUSCULAR | Qty: 1 | Status: AC

## 2022-11-21 ENCOUNTER — Inpatient Hospital Stay: Payer: 59 | Attending: Oncology

## 2022-11-21 ENCOUNTER — Inpatient Hospital Stay: Payer: 59

## 2022-11-21 ENCOUNTER — Encounter: Payer: Self-pay | Admitting: Oncology

## 2022-11-21 ENCOUNTER — Inpatient Hospital Stay (HOSPITAL_BASED_OUTPATIENT_CLINIC_OR_DEPARTMENT_OTHER): Payer: 59 | Admitting: Oncology

## 2022-11-21 VITALS — BP 146/56 | HR 71 | Temp 98.3°F | Wt 156.6 lb

## 2022-11-21 DIAGNOSIS — D701 Agranulocytosis secondary to cancer chemotherapy: Secondary | ICD-10-CM | POA: Insufficient documentation

## 2022-11-21 DIAGNOSIS — I2510 Atherosclerotic heart disease of native coronary artery without angina pectoris: Secondary | ICD-10-CM | POA: Diagnosis not present

## 2022-11-21 DIAGNOSIS — T451X5A Adverse effect of antineoplastic and immunosuppressive drugs, initial encounter: Secondary | ICD-10-CM | POA: Diagnosis not present

## 2022-11-21 DIAGNOSIS — Z87891 Personal history of nicotine dependence: Secondary | ICD-10-CM | POA: Insufficient documentation

## 2022-11-21 DIAGNOSIS — Z5112 Encounter for antineoplastic immunotherapy: Secondary | ICD-10-CM | POA: Insufficient documentation

## 2022-11-21 DIAGNOSIS — Z5189 Encounter for other specified aftercare: Secondary | ICD-10-CM | POA: Insufficient documentation

## 2022-11-21 DIAGNOSIS — E876 Hypokalemia: Secondary | ICD-10-CM

## 2022-11-21 DIAGNOSIS — E7800 Pure hypercholesterolemia, unspecified: Secondary | ICD-10-CM | POA: Diagnosis not present

## 2022-11-21 DIAGNOSIS — I10 Essential (primary) hypertension: Secondary | ICD-10-CM | POA: Diagnosis not present

## 2022-11-21 DIAGNOSIS — I700 Atherosclerosis of aorta: Secondary | ICD-10-CM | POA: Diagnosis not present

## 2022-11-21 DIAGNOSIS — D6481 Anemia due to antineoplastic chemotherapy: Secondary | ICD-10-CM

## 2022-11-21 DIAGNOSIS — C20 Malignant neoplasm of rectum: Secondary | ICD-10-CM

## 2022-11-21 DIAGNOSIS — R634 Abnormal weight loss: Secondary | ICD-10-CM | POA: Insufficient documentation

## 2022-11-21 DIAGNOSIS — K219 Gastro-esophageal reflux disease without esophagitis: Secondary | ICD-10-CM | POA: Insufficient documentation

## 2022-11-21 DIAGNOSIS — Z933 Colostomy status: Secondary | ICD-10-CM | POA: Insufficient documentation

## 2022-11-21 DIAGNOSIS — C799 Secondary malignant neoplasm of unspecified site: Secondary | ICD-10-CM | POA: Diagnosis not present

## 2022-11-21 DIAGNOSIS — K521 Toxic gastroenteritis and colitis: Secondary | ICD-10-CM | POA: Diagnosis not present

## 2022-11-21 DIAGNOSIS — Z86711 Personal history of pulmonary embolism: Secondary | ICD-10-CM | POA: Diagnosis not present

## 2022-11-21 DIAGNOSIS — Z79899 Other long term (current) drug therapy: Secondary | ICD-10-CM | POA: Diagnosis not present

## 2022-11-21 DIAGNOSIS — Z5111 Encounter for antineoplastic chemotherapy: Secondary | ICD-10-CM

## 2022-11-21 DIAGNOSIS — Z800 Family history of malignant neoplasm of digestive organs: Secondary | ICD-10-CM | POA: Diagnosis not present

## 2022-11-21 LAB — COMPREHENSIVE METABOLIC PANEL
ALT: 19 U/L (ref 0–44)
AST: 30 U/L (ref 15–41)
Albumin: 3.7 g/dL (ref 3.5–5.0)
Alkaline Phosphatase: 76 U/L (ref 38–126)
Anion gap: 10 (ref 5–15)
BUN: 14 mg/dL (ref 8–23)
CO2: 27 mmol/L (ref 22–32)
Calcium: 8.8 mg/dL — ABNORMAL LOW (ref 8.9–10.3)
Chloride: 103 mmol/L (ref 98–111)
Creatinine, Ser: 0.71 mg/dL (ref 0.44–1.00)
GFR, Estimated: >60 mL/min (ref >60)
Glucose, Bld: 100 mg/dL — ABNORMAL HIGH (ref 70–99)
Potassium: 3.7 mmol/L (ref 3.5–5.1)
Sodium: 140 mmol/L (ref 135–145)
Total Bilirubin: 0.4 mg/dL (ref 0.3–1.2)
Total Protein: 6.5 g/dL (ref 6.5–8.1)

## 2022-11-21 LAB — CBC WITH DIFFERENTIAL/PLATELET
Abs Immature Granulocytes: 0.03 10*3/uL (ref 0.00–0.07)
Basophils Absolute: 0 10*3/uL (ref 0.0–0.1)
Basophils Relative: 0 %
Eosinophils Absolute: 0.2 10*3/uL (ref 0.0–0.5)
Eosinophils Relative: 2 %
HCT: 36.4 % (ref 36.0–46.0)
Hemoglobin: 11.6 g/dL — ABNORMAL LOW (ref 12.0–15.0)
Immature Granulocytes: 0 %
Lymphocytes Relative: 17 %
Lymphs Abs: 1.4 10*3/uL (ref 0.7–4.0)
MCH: 30.9 pg (ref 26.0–34.0)
MCHC: 31.9 g/dL (ref 30.0–36.0)
MCV: 96.8 fL (ref 80.0–100.0)
Monocytes Absolute: 0.5 10*3/uL (ref 0.1–1.0)
Monocytes Relative: 7 %
Neutro Abs: 6 10*3/uL (ref 1.7–7.7)
Neutrophils Relative %: 74 %
Platelets: 230 10*3/uL (ref 150–400)
RBC: 3.76 MIL/uL — ABNORMAL LOW (ref 3.87–5.11)
RDW: 15.9 % — ABNORMAL HIGH (ref 11.5–15.5)
WBC: 8.1 10*3/uL (ref 4.0–10.5)
nRBC: 0 % (ref 0.0–0.2)

## 2022-11-21 LAB — MAGNESIUM: Magnesium: 1.7 mg/dL (ref 1.7–2.4)

## 2022-11-21 MED ORDER — SODIUM CHLORIDE 0.9 % IV SOLN
100.0000 mg/m2 | Freq: Once | INTRAVENOUS | Status: AC
  Administered 2022-11-21: 180 mg via INTRAVENOUS
  Filled 2022-11-21: qty 9

## 2022-11-21 MED ORDER — SODIUM CHLORIDE 0.9 % IV SOLN
10.0000 mg | Freq: Once | INTRAVENOUS | Status: AC
  Administered 2022-11-21: 10 mg via INTRAVENOUS
  Filled 2022-11-21: qty 10

## 2022-11-21 MED ORDER — SODIUM CHLORIDE 0.9 % IV SOLN
Freq: Once | INTRAVENOUS | Status: AC
  Filled 2022-11-21: qty 250

## 2022-11-21 MED ORDER — DIPHENHYDRAMINE HCL 50 MG/ML IJ SOLN
25.0000 mg | Freq: Once | INTRAMUSCULAR | Status: AC
  Administered 2022-11-21: 25 mg via INTRAVENOUS
  Filled 2022-11-21: qty 1

## 2022-11-21 MED ORDER — ATROPINE SULFATE 1 MG/ML IV SOLN
0.5000 mg | Freq: Once | INTRAVENOUS | Status: AC
  Administered 2022-11-21: 0.5 mg via INTRAVENOUS
  Filled 2022-11-21: qty 1

## 2022-11-21 MED ORDER — PALONOSETRON HCL INJECTION 0.25 MG/5ML
0.2500 mg | Freq: Once | INTRAVENOUS | Status: AC
  Administered 2022-11-21: 0.25 mg via INTRAVENOUS
  Filled 2022-11-21: qty 5

## 2022-11-21 MED ORDER — CLINDAMYCIN PHOSPHATE 1 % EX GEL: Freq: Two times a day (BID) | CUTANEOUS | 1 refills | Status: DC

## 2022-11-21 MED ORDER — MAGNESIUM CHLORIDE 64 MG PO TBEC: 1.0000 | DELAYED_RELEASE_TABLET | Freq: Every day | ORAL | 1 refills | Status: DC

## 2022-11-21 MED ORDER — SODIUM CHLORIDE 0.9 % IV SOLN
6.0000 mg/kg | Freq: Once | INTRAVENOUS | Status: AC
  Administered 2022-11-21: 400 mg via INTRAVENOUS
  Filled 2022-11-21: qty 20

## 2022-11-21 MED ORDER — HEPARIN SOD (PORK) LOCK FLUSH 100 UNIT/ML IV SOLN
500.0000 [IU] | Freq: Once | INTRAVENOUS | Status: AC | PRN
  Administered 2022-11-21: 500 [IU]
  Filled 2022-11-21: qty 5

## 2022-11-21 NOTE — Patient Instructions (Signed)
[facility]  Discharge Instructions: Thank you for choosing [facility] to provide your oncology and hematology care.  If you have a lab appointment with the Cancer Center, please go directly to the Cancer Center and check in at the registration area.  Wear comfortable clothing and clothing appropriate for easy access to any Portacath or PICC line.   We strive to give you quality time with your provider. You may need to reschedule your appointment if you arrive late (15 or more minutes).  Arriving late affects you and other patients whose appointments are after yours.  Also, if you miss three or more appointments without notifying the office, you may be dismissed from the clinic at the provider's discretion.      For prescription refill requests, have your pharmacy contact our office and allow 72 hours for refills to be completed.    Today you received the following chemotherapy and/or immunotherapy agents VECTIBEX, IRINOTECAN      To help prevent nausea and vomiting after your treatment, we encourage you to take your nausea medication as directed.  BELOW ARE SYMPTOMS THAT SHOULD BE REPORTED IMMEDIATELY: *FEVER GREATER THAN 100.4 F (38 C) OR HIGHER *CHILLS OR SWEATING *NAUSEA AND VOMITING THAT IS NOT CONTROLLED WITH YOUR NAUSEA MEDICATION *UNUSUAL SHORTNESS OF BREATH *UNUSUAL BRUISING OR BLEEDING *URINARY PROBLEMS (pain or burning when urinating, or frequent urination) *BOWEL PROBLEMS (unusual diarrhea, constipation, pain near the anus) TENDERNESS IN MOUTH AND THROAT WITH OR WITHOUT PRESENCE OF ULCERS (sore throat, sores in mouth, or a toothache) UNUSUAL RASH, SWELLING OR PAIN  UNUSUAL VAGINAL DISCHARGE OR ITCHING   Items with * indicate a potential emergency and should be followed up as soon as possible or go to the Emergency Department if any problems should occur.  Please show the CHEMOTHERAPY ALERT CARD or IMMUNOTHERAPY ALERT CARD at  check-in to the Emergency Department and triage nurse.  Should you have questions after your visit or need to cancel or reschedule your appointment, please contact [facility]  [phone] and follow the prompts.  Office hours are 8:00 a.m. to 4:30 p.m. Monday - Friday. Please note that voicemails left after 4:00 p.m. may not be returned until the following business day.  We are closed weekends and major holidays. You have access to a nurse at all times for urgent questions. Please call the main number to the clinic [phone] and follow the prompts.  For any non-urgent questions, you may also contact your provider using MyChart. We now offer e-Visits for anyone 18 and older to request care online for non-urgent symptoms. For details visit [email or web address].   Also download the MyChart app! Go to the app store, search "MyChart", open the app, select [facility], and log in with your MyChart username and password.   Panitumumab Injection What is this medication? PANITUMUMAB (pan i TOOM ue mab) treats colorectal cancer. It works by blocking a protein that causes cancer cells to grow and multiply. This helps to slow or stop the spread of cancer cells. It is a monoclonal antibody. This medicine may be used for other purposes; ask your health care provider or pharmacist if you have questions. COMMON BRAND NAME(S): Vectibix What should I tell my care team before I take this medication? They need to know if you have any of these conditions: Eye disease Low levels of magnesium in the blood Lung disease An unusual or allergic reaction to panitumumab, other medications, foods,   dyes, or preservatives Pregnant or trying to get pregnant Breast-feeding How should I use this medication? This medication is injected into a vein. It is given by your care team in a hospital or clinic setting. Talk to your care team about the use of this medication in children. Special  care may be needed. Overdosage: If you think you have taken too much of this medicine contact a poison control center or emergency room at once. NOTE: This medicine is only for you. Do not share this medicine with others. What if I miss a dose? Keep appointments for follow-up doses. It is important not to miss your dose. Call your care team if you are unable to keep an appointment. What may interact with this medication? Bevacizumab This list may not describe all possible interactions. Give your health care provider a list of all the medicines, herbs, non-prescription drugs, or dietary supplements you use. Also tell them if you smoke, drink alcohol, or use illegal drugs. Some items may interact with your medicine. What should I watch for while using this medication? Your condition will be monitored carefully while you are receiving this medication. This medication may make you feel generally unwell. This is not uncommon as chemotherapy can affect healthy cells as well as cancer cells. Report any side effects. Continue your course of treatment even though you feel ill unless your care team tells you to stop. This medication can make you more sensitive to the sun. Keep out of the sun while receiving this medication and for 2 months after stopping therapy. If you cannot avoid being in the sun, wear protective clothing and sunscreen. Do not use sun lamps, tanning beds, or tanning booths. Check with your care team if you have severe diarrhea, nausea, and vomiting or if you sweat a lot. The loss of too much body fluid may make it dangerous for you to take this medication. This medication may cause serious skin reactions. They can happen weeks to months after starting the medication. Contact your care team right away if you notice fevers or flu-like symptoms with a rash. The rash may be red or purple and then turn into blisters or peeling of the skin. You may also notice a red rash with swelling of the face,  lips, or lymph nodes in your neck or under your arms. Talk to your care team if you may be pregnant. Serious birth defects can occur if you take this medication during pregnancy and for 2 months after the last dose. Contraception is recommended while taking this medication and for 2 months after the last dose. Your care team can help you find the option that works for you. Do not breastfeed while taking this medication and for 2 months after the last dose. This medication may cause infertility. Talk to your care team if you are concerned about your fertility. What side effects may I notice from receiving this medication? Side effects that you should report to your care team as soon as possible: Allergic reactions--skin rash, itching, hives, swelling of the face, lips, tongue, or throat Dry cough, shortness of breath or trouble breathing Eye pain, redness, irritation, or discharge with blurry or decreased vision Infusion reactions--chest pain, shortness of breath or trouble breathing, feeling faint or lightheaded Low magnesium level--muscle pain or cramps, unusual weakness or fatigue, fast or irregular heartbeat, tremors Low potassium level--muscle pain or cramps, unusual weakness or fatigue, fast or irregular heartbeat, constipation Redness, blistering, peeling, or loosening of the skin, including inside the   mouth Skin reactions on sun-exposed areas Side effects that usually do not require medical attention (report to your care team if they continue or are bothersome): Change in nail shape, thickness, or color Diarrhea Dry skin Fatigue Nausea Vomiting This list may not describe all possible side effects. Call your doctor for medical advice about side effects. You may report side effects to FDA at 1-800-FDA-1088. Where should I keep my medication? This medication is given in a hospital or clinic. It will not be stored at home. NOTE: This sheet is a summary. It may not cover all possible  information. If you have questions about this medicine, talk to your doctor, pharmacist, or health care provider.  2023 Elsevier/Gold Standard (2022-02-15 00:00:00)   Irinotecan Injection What is this medication? IRINOTECAN (ir in oh TEE kan) treats some types of cancer. It works by slowing down the growth of cancer cells. This medicine may be used for other purposes; ask your health care provider or pharmacist if you have questions. COMMON BRAND NAME(S): Camptosar What should I tell my care team before I take this medication? They need to know if you have any of these conditions: Dehydration Diarrhea Infection, especially a viral infection, such as chickenpox, cold sores, herpes Liver disease Low blood cell levels (white cells, red cells, and platelets) Low levels of electrolytes, such as calcium, magnesium, or potassium in your blood Recent or ongoing radiation An unusual or allergic reaction to irinotecan, other medications, foods, dyes, or preservatives If you or your partner are pregnant or trying to get pregnant Breast-feeding How should I use this medication? This medication is injected into a vein. It is given by your care team in a hospital or clinic setting. Talk to your care team about the use of this medication in children. Special care may be needed. Overdosage: If you think you have taken too much of this medicine contact a poison control center or emergency room at once. NOTE: This medicine is only for you. Do not share this medicine with others. What if I miss a dose? Keep appointments for follow-up doses. It is important not to miss your dose. Call your care team if you are unable to keep an appointment. What may interact with this medication? Do not take this medication with any of the following: Cobicistat Itraconazole This medication may also interact with the following: Certain antibiotics, such as clarithromycin, rifampin, rifabutin Certain antivirals for HIV  or AIDS Certain medications for fungal infections, such as ketoconazole, posaconazole, voriconazole Certain medications for seizures, such as carbamazepine, phenobarbital, phenytoin Gemfibrozil Nefazodone St. John's wort This list may not describe all possible interactions. Give your health care provider a list of all the medicines, herbs, non-prescription drugs, or dietary supplements you use. Also tell them if you smoke, drink alcohol, or use illegal drugs. Some items may interact with your medicine. What should I watch for while using this medication? Your condition will be monitored carefully while you are receiving this medication. You may need blood work while taking this medication. This medication may make you feel generally unwell. This is not uncommon as chemotherapy can affect healthy cells as well as cancer cells. Report any side effects. Continue your course of treatment even though you feel ill unless your care team tells you to stop. This medication can cause serious side effects. To reduce the risk, your care team may give you other medications to take before receiving this one. Be sure to follow the directions from your care team. This medication   may affect your coordination, reaction time, or judgement. Do not drive or operate machinery until you know how this medication affects you. Sit up or stand slowly to reduce the risk of dizzy or fainting spells. Drinking alcohol with this medication can increase the risk of these side effects. This medication may increase your risk of getting an infection. Call your care team for advice if you get a fever, chills, sore throat, or other symptoms of a cold or flu. Do not treat yourself. Try to avoid being around people who are sick. Avoid taking medications that contain aspirin, acetaminophen, ibuprofen, naproxen, or ketoprofen unless instructed by your care team. These medications may hide a fever. This medication may increase your risk to  bruise or bleed. Call your care team if you notice any unusual bleeding. Be careful brushing or flossing your teeth or using a toothpick because you may get an infection or bleed more easily. If you have any dental work done, tell your dentist you are receiving this medication. Talk to your care team if you or your partner are pregnant or think either of you might be pregnant. This medication can cause serious birth defects if taken during pregnancy and for 6 months after the last dose. You will need a negative pregnancy test before starting this medication. Contraception is recommended while taking this medication and for 6 months after the last dose. Your care team can help you find the option that works for you. Do not father a child while taking this medication and for 3 months after the last dose. Use a condom for contraception during this time period. Do not breastfeed while taking this medication and for 7 days after the last dose. This medication may cause infertility. Talk to your care team if you are concerned about your fertility. What side effects may I notice from receiving this medication? Side effects that you should report to your care team as soon as possible: Allergic reactions--skin rash, itching, hives, swelling of the face, lips, tongue, or throat Dry cough, shortness of breath or trouble breathing Increased saliva or tears, increased sweating, stomach cramping, diarrhea, small pupils, unusual weakness or fatigue, slow heartbeat Infection--fever, chills, cough, sore throat, wounds that don't heal, pain or trouble when passing urine, general feeling of discomfort or being unwell Kidney injury--decrease in the amount of urine, swelling of the ankles, hands, or feet Low red blood cell level--unusual weakness or fatigue, dizziness, headache, trouble breathing Severe or prolonged diarrhea Unusual bruising or bleeding Side effects that usually do not require medical attention (report  to your care team if they continue or are bothersome): Constipation Diarrhea Hair loss Loss of appetite Nausea Stomach pain This list may not describe all possible side effects. Call your doctor for medical advice about side effects. You may report side effects to FDA at 1-800-FDA-1088. Where should I keep my medication? This medication is given in a hospital or clinic. It will not be stored at home. NOTE: This sheet is a summary. It may not cover all possible information. If you have questions about this medicine, talk to your doctor, pharmacist, or health care provider.  2023 Elsevier/Gold Standard (2022-02-09 00:00:00)   

## 2022-11-21 NOTE — Assessment & Plan Note (Signed)
Continue  Eliquis 2.5 mg twice daily for anticoagulation prophylaxis. 

## 2022-11-21 NOTE — Assessment & Plan Note (Signed)
Chemotherapy plan as listed above 

## 2022-11-21 NOTE — Assessment & Plan Note (Signed)
Hb is stable. observation 

## 2022-11-21 NOTE — Assessment & Plan Note (Signed)
patient will receive long-acting GCSF prophylaxis on D3 

## 2022-11-21 NOTE — Assessment & Plan Note (Addendum)
History of stage IIIC Rectal cancer, s/p TNT, followed by 09/17/19 APR/posterior vaginectomy/TAH/BSO/VY-flap, pT4b pN0 with close vaginal margin 0.2 mm.  Uterus and ovaries negative for malignancy. palliative radiation to vaginal recurrence- 01/19/21 recurrence with lung metastasis.-Palliative -FOLFIRI plus bevacizumab.  Irinotecan was dropped in November 2022 due to side effects. Negative for UGT1A1*28 - radiographically stable, rise of CEA-July 2023 CT lung metastasis worse--> Dec 2023 PET showed progression in pelvic lymph nodes and bone lesions,--> 2nd line irinotecan +panitumumab Labs are reviewed and discussed with patient, CEA is trending down.  Proceed with  irinotecan +panitumumab Day 3 GCSF due to neutropenia

## 2022-11-21 NOTE — Progress Notes (Signed)
Hematology/Oncology Progress note [phone] [phone]      CHIEF COMPLAINTS/REASON FOR VISIT:  Follow up for rectal cancer  ASSESSMENT & PLAN:   Cancer Staging  Rectal cancer [facility]) Staging form: Colon and Rectum, AJCC 8th Edition - Pathologic stage from 10/06/2019: Stage IIC (ypT4b, pN0, cM0) - Signed by Earlie Server, MD on 10/06/2019 - Clinical stage from 06/30/2020: Jacqueline Yoder - Signed by Earlie Server, MD on 04/17/2022 - Pathologic: Stage Unknown (rpTX, pNX, cM1) - Signed by Earlie Server, MD on 01/31/2021   Rectal cancer [facility]) History of stage IIIC Rectal cancer, s/p TNT, followed by 09/17/19 APR/posterior vaginectomy/TAH/BSO/VY-flap, pT4b pN0 with close vaginal margin 0.2 mm.  Uterus and ovaries negative for malignancy. palliative radiation to vaginal recurrence- 01/19/21 recurrence with lung metastasis.-Palliative -FOLFIRI plus bevacizumab.  Irinotecan was dropped in November 2022 due to side effects. Negative for UGT1A1*28 - radiographically stable, rise of CEA-July 2023 CT lung metastasis worse--> Dec 2023 PET showed progression in pelvic lymph nodes and bone lesions,--> 2nd line irinotecan +panitumumab Labs are reviewed and discussed with patient, CEA is trending down.  Proceed with  irinotecan +panitumumab Day 3 GCSF due to neutropenia  Anemia due to antineoplastic chemotherapy Hb is stable. observation  Chemotherapy induced neutropenia ([place]) patient will receive long-acting GCSF prophylaxis on D3  Encounter for antineoplastic chemotherapy Chemotherapy plan as listed above.   History of pulmonary embolism Continue  Eliquis 2.5 mg twice daily for anticoagulation prophylaxis.  Hypokalemia  potassium is stable.  Continue potassium chloride 20 mEq  daily.   Chemotherapy induced diarrhea Recommend imodium PRN as instructed.    Follow up 2 weeks Lab MD irinotecan + panitumumab All questions were answered. The patient knows to call the clinic with any  problems, questions or concerns.  Earlie Server, MD, PhD [facility] Hematology Oncology 11/21/2022      HISTORY OF PRESENTING ILLNESS:  Patient initially presented with complaints of postmenopausal bleeding on 08/16/2018.  History of was menopausal vaginal bleeding in 2016 which resulted in cervical polypectomy.  Pathology 02/04/2015 showed cervical polyp, consistent with benign endometrial polyp.  [name] after polypectomy due to anxiety associated with pelvic exams.  pelvic exam on 08/16/2018 reviewed cervical abnormality and from enlarged uterus. Seen by Dr. Marcelline Mates on 10/29/2018.  Endometrial biopsy and a Pap smear was performed. 10/29/2018 Pap smear showed adenocarcinoma, favor endometrial origin. 10/29/2018 endometrial biopsy showed endometrioid carcinoma, FIGO grade 1.  10/29/2018- TA & TV Ultrasound revealed: Anteverted uterus measuring 8.7 x 5.6 x 6.4 cm without evidence of focal masses.  The endometrium measuring 24.1 mm (thickened) and heterogeneous.  Right and left ovaries not visualized.  No adnexal masses identified.  No free fluid in cul-de-sac.  Patient was seen by Dr. Theora Gianotti in clinic on 11/13/2018.  Cervical exam reveals 2 cm exophytic irregular mass consistent with malignancy.   11/19/2018 CT chest abdomen pelvis with contrast showed thickened endometrium with some irregularity compatible with the provided diagnosis of endometrial malignancy.  There is a mildly prominent left inguinal node 1.4 cm.  Patient was seen by Dr. Fransisca Connors on 11/20/2018 and left groin lymph node biopsy was recommended.  11/26/2018 patient underwent left inguinal lymph node biopsy. Pathology showed metastatic adenocarcinoma consistent with colorectal origin.  CDX 2+.  Case was discussed on tumor board.  Recommend colonoscopy for further evaluation.  Patient reports significant weight loss 30 pounds over the last year.  Chronic vaginal spotting. Change of bowel habits the past few months.  More  constipated.  Family history positive for brother who has colon cancer prostate cancer.  patient has underwent colonoscopy on 12/03/2018 which reviewed a nonobstructing large mass in the rectum.  Also chronic fistula.  Mass was not circumferential.  This was biopsied with a cold forceps for histology.  Pathology came back hyperplastic polyp negative for dysplasia and malignancy. [name], patient underwent flex sigmoidoscopy on 12/06/2018 with rebiopsy of the rectal mass. This time biopsy results came back positive for invasive colorectal adenocarcinoma, moderately differentiated. Immunotherapy for nearly mismatch repair protein (MMR ) was performed.  There is no loss of MMR expression.  low probability of MSI high.   # Seen by [name] for evaluation of resectability for rectal cancer. In addition, she also had a second opinion with [facility] pathology where her endometrial biopsy pathology was changed to  adenocarcinoma, consistent with colorectal primary.   [name]. She has home health that has been assisting with ostomy care  Patient was also evaluated by [facility] oncology.  Recommendation is to proceed with TNT with concurrent chemoradiation followed by neoadjuvant chemotherapy followed by surgical resection. Patient prefers to have treatment done locally with [facility].   # Oncology Treatment:  02/03/2019- 03/19/2019  concurrent Xeloda and radiation.  Xeloda dose '825mg'$  /m2 BID - rounded to '1650mg'$  BID- on days of radiation. 04/09/2019, started on FOLFOX with bolus early.  Omitted.  07/16/2019 finished 8 cycles of FOLFOX. 09/17/19 APR/posterior vaginectomy/TAH/BSO/VY-flap pT4b pN0 with close vaginal margin 0.2 mm.  Uterus and ovaries negative for malignancy. Patient reports bilateral lower extremity numbness and tingling, intermittent, left worse than right. She has lost a lot of weight since her APR surgery.   #Family history with half  brother having's history of colon cancer prostate cancer.  Personal history of colorectal cancer.  Patient has not decided if she wants genetic testing.    # history of PE( 01/13/2020)  in the bilateral lower extremity DVT (01/13/2020).   She finishes 6 months of anticoagulation with Eliquis 5 mg twice daily. Now switched to Eliquis 2.5 mg twice daily..  # She has now developed recurrent disease. #06/30/20  vaginal introitus mass biopsied. Pathology is consistent with metastatic colorectal adenocarcinoma I have discussed with [name]  Dr. Hester Mates and the mass is not resectable. Patient has also had colonoscopy by Dr. Vicente Males yesterday. Normal examination. # 07/16/2020 cycle 1 FOLFIRI  # 07/20/2020 PET scan was done for further evaluation, images are consistent with local recurrence, no distant metastasis. #Discussed with radiation oncology Dr. Baruch Gouty will recommends concurrent chemotherapy and radiation. 08/02/2020-08/16/2020, patient starts radiation.  Xeloda was held due to neutropenia 08/17/2020,-09/06/2020 Xeloda 1500 mg twice daily concurrently with radiation  01/31/21 started on FOLFIRI + Bev 05/18/2021 CT chest abdomen pelvis showed Previously noted enlargement of bilateral inguinal lymph nodes is resolved, consistent with treatment response of nodal metastatic disease. Interval decrease in size of multiple small bilateral pulmonary nodules, consistent with treatment response of pulmonary metastatic disease. No evidence of new metastatic disease. 05/24/2021 - 08/30/2021, continued on FOLFIRI plus bevacizumab.  Irinotecan dose was reduced, eventually '100mg'$ /m2  09/02/2021, CT chest abdomen pelvis without contrast Showed small bilateral pulmonary nodules, unchanged.  Stable metastatic disease.  No noncontrast evidence of new metastatic disease in the chest abdomen pelvis.  Small parastomal hernia.  Enlargement of main pulmonary artery.  Coronary artery disease.  09/13/2021, maintenance  5-FU/bevacizumab 11/28/2021, 5-FU/Irinotecan/bevacizumab.  Irinotecan 100 mg/m2 was added back due to progressively increasing CEA.  INTERVAL HISTORY  Jacqueline Yoder is a 77 y.o. female who has above history reviewed by me presents for follow-up of rectal cancer. On Irinotecan and panitumumab. she tolerates well. No significant increase of ostomy output except 2-3 days ago, Denies any nausea vomiting abdominal pain  She has developed acne rash on her face and neck.  She has stable weight.     Review of Systems  Constitutional:  Positive for fatigue. Negative for appetite change, chills, fever and unexpected weight change.  HENT:   Negative for hearing loss and voice change.   Eyes:  Negative for eye problems.  Respiratory:  Negative for chest tightness and cough.   Cardiovascular:  Negative for chest pain.  Gastrointestinal:  Negative for abdominal distention, abdominal pain, blood in stool, constipation, diarrhea and nausea.  Endocrine: Negative for hot flashes.  Genitourinary:  Negative for difficulty urinating and frequency.   Musculoskeletal:  Positive for arthralgias.  Skin:  Positive for rash. Negative for itching.  Neurological:  Negative for extremity weakness and numbness.  Hematological:  Negative for adenopathy.  Psychiatric/Behavioral:  Negative for confusion.     MEDICAL HISTORY:  Past Medical History:  Diagnosis Date   Allergy    Arthritis    Blood clot in vein    Family history of colon cancer    GERD (gastroesophageal reflux disease)    Hypercholesteremia    Hypertension    Hypertension    Lower extremity edema    Personal history of chemotherapy    Rectal cancer ([place]) 12/2018   Urinary incontinence     SURGICAL HISTORY: Past Surgical History:  Procedure Laterality Date   ABDOMINAL HYSTERECTOMY     CHOLECYSTECTOMY  1971   COLONOSCOPY WITH PROPOFOL N/A 12/03/2018   Procedure: COLONOSCOPY WITH PROPOFOL;  Surgeon: Lucilla Lame, MD;  Location: [facility]  ENDOSCOPY;  Service: Endoscopy;  Laterality: N/A;   COLONOSCOPY WITH PROPOFOL N/A 07/15/2020   Procedure: COLONOSCOPY WITH PROPOFOL;  Surgeon: [name], MD;  Location: [facility] ENDOSCOPY;  Service: Gastroenterology;  Laterality: N/A;   FLEXIBLE SIGMOIDOSCOPY N/A 12/06/2018   Procedure: FLEXIBLE SIGMOIDOSCOPY;  Surgeon: [name], MD;  Location: [facility] ENDOSCOPY;  Service: Endoscopy;  Laterality: N/A;   LAPAROSCOPIC COLOSTOMY  01/06/2019   PORTACATH PLACEMENT N/A 04/03/2019   Procedure: INSERTION PORT-A-CATH;  Surgeon: Jules Husbands, MD;  Location: [facility] ORS;  Service: General;  Laterality: N/A;    SOCIAL HISTORY: Social History   Socioeconomic History   Marital status: Widowed    Spouse name: Not on file   Number of children: Not on file   Years of education: Not on file   Highest education level: Not on file  Occupational History   Not on file  Tobacco Use   Smoking status: Former    Types: Cigarettes    Quit date: 12/02/1977    Years since quitting: 45.0   Smokeless tobacco: Former  Scientific laboratory technician Use: Never used  Substance and Sexual Activity   Alcohol use: Never   Drug use: Never   Sexual activity: Not Currently    Birth control/protection: None  Other Topics Concern   Not on file  Social History Narrative   Lives with daughter   Social Determinants of Health   Financial Resource Strain: Medium Risk (06/30/2022)   Overall Financial Resource Strain (CARDIA)    Difficulty of Paying Living Expenses: Somewhat hard  Food Insecurity: No Food Insecurity (06/30/2022)   Hunger Vital Sign    Worried About Running Out of Food in the  Last Year: Never true    [place] in the Last Year: Never true  Transportation Needs: No Transportation Needs (06/30/2022)   PRAPARE - Hydrologist (Medical): No    Lack of Transportation (Non-Medical): No  Physical Activity: Inactive (06/30/2022)   Exercise Vital Sign    Days of Exercise per Week: 0 days     Minutes of Exercise per Session: 0 min  Stress: No Stress Concern Present (06/30/2022)   [place]    Feeling of Stress : Not at all  Social Connections: Moderately Isolated (06/30/2022)   Social Connection and Isolation Panel [NHANES]    Frequency of Communication with Friends and Family: More than three times a week    Frequency of Social Gatherings with Friends and Family: Twice a week    Attends Religious Services: More than 4 times per year    Active Member of [employer] or Organizations: No    Attends Archivist Meetings: Never    Marital Status: Widowed  Intimate Partner Violence: Not At Risk (06/30/2022)   Humiliation, Afraid, Rape, and Kick questionnaire    Fear of Current or Ex-Partner: No    Emotionally Abused: No    Physically Abused: No    Sexually Abused: No    FAMILY HISTORY: Family History  Problem Relation Age of Onset   Colon cancer Brother 39       exposure to chemicals [place]   Hypertension Mother    Stroke Mother    Kidney failure Father    Breast cancer Neg Hx    Ovarian cancer Neg Hx     ALLERGIES:  is allergic to sulfamethoxazole-trimethoprim.  MEDICATIONS:  Current Outpatient Medications  Medication Sig Dispense Refill   Cholecalciferol (VITAMIN D3) 2000 units capsule Take 2,000 Units by mouth daily.     cholestyramine (QUESTRAN) 4 g packet Take 1 packet (4 g total) by mouth 3 (three) times daily. 90 each 1   clindamycin (CLINDAGEL) 1 % gel Apply topically 2 (two) times daily. 30 g 1   diclofenac sodium (VOLTAREN) 1 % GEL Apply 2 g topically 4 (four) times daily as needed (joint pain).  11   diphenoxylate-atropine (LOMOTIL) 2.5-0.025 MG tablet Take 1 tablet by mouth 4 (four) times daily as needed for diarrhea or loose stools. 30 tablet 1   ELIQUIS 2.5 MG TABS tablet TAKE 1 TABLET BY MOUTH TWICE  DAILY 200 tablet 2   fluticasone (FLONASE) 50 MCG/ACT nasal spray USE 1 SPRAY IN EACH  NOSTRIL ONCE DAILY 16 g 3   gabapentin (NEURONTIN) 100 MG capsule Take 1 capsule (100 mg total) by mouth at bedtime. 30 capsule 0   lidocaine-prilocaine (EMLA) cream Apply 1 application. topically as needed. 30 g 6   loperamide (IMODIUM) 2 MG capsule Take 1 capsule (2 mg total) by mouth See admin instructions. With onset of loose stool, take '4mg'$  followed by '2mg'$  every 2 hours,  Maximum: 16 mg/day 120 capsule 1   loratadine (CLARITIN) 10 MG tablet Take 10 mg by mouth daily.     magnesium chloride (SLOW-MAG) 64 MG TBEC SR tablet Take 1 tablet (64 mg total) by mouth daily. 30 tablet 1   Multiple Vitamins-Minerals (ONE-A-DAY WOMENS 50 PLUS PO) Take 1 tablet by mouth daily.      potassium chloride SA (KLOR-CON M) 20 MEQ tablet TAKE 1 TABLET BY MOUTH DAILY 90 tablet 1   simvastatin (ZOCOR) 40 MG tablet  Take 1 tablet (40 mg total) by mouth at bedtime. 100 tablet 3   triamcinolone cream (KENALOG) 0.5 % Apply 1 Application topically 2 (two) times daily. To affected areas, for up to 2 weeks. 30 g 2   triamterene-hydrochlorothiazide (DYAZIDE) 37.5-25 MG capsule Take 1 each (1 capsule total) by mouth daily. 100 capsule 3   zinc gluconate 50 MG tablet Take 50 mg by mouth daily.     benzonatate (TESSALON) 200 MG capsule Take 1 capsule (200 mg total) by mouth 3 (three) times daily as needed for cough. (Patient not taking: Reported on 10/10/2022) 30 capsule 0   No current facility-administered medications for this visit.   Facility-Administered Medications Ordered in Other Visits  Medication Dose Route Frequency Provider Last Rate Last Admin   atropine injection 0.5 mg  0.5 mg Intravenous Once Earlie Server, MD       dexamethasone (DECADRON) 10 mg in sodium chloride 0.9 % 50 mL IVPB  10 mg Intravenous Once Earlie Server, MD 204 mL/hr at 11/21/22 0914 10 mg at 11/21/22 0914   diphenhydrAMINE (BENADRYL) injection 25 mg  25 mg Intravenous Once Earlie Server, MD       heparin lock flush 100 unit/mL  500 Units Intracatheter Once  PRN Earlie Server, MD       irinotecan (CAMPTOSAR) 180 mg in sodium chloride 0.9 % 500 mL chemo infusion  100 mg/m2 (Treatment Plan Recorded) Intravenous Once Earlie Server, MD       palonosetron (ALOXI) injection 0.25 mg  0.25 mg Intravenous Once Earlie Server, MD       panitumumab (VECTIBIX) 400 mg in sodium chloride 0.9 % 100 mL chemo infusion  6 mg/kg (Treatment Plan Recorded) Intravenous Once Earlie Server, MD         PHYSICAL EXAMINATION: ECOG PERFORMANCE STATUS: 1 - Symptomatic but completely ambulatory  Physical Exam Constitutional:      General: She is not in acute distress. HENT:     Head: Normocephalic and atraumatic.  Eyes:     General: No scleral icterus. Cardiovascular:     Rate and Rhythm: Normal rate.     Heart sounds: Normal heart sounds.  Pulmonary:     Effort: Pulmonary effort is normal. No respiratory distress.     Breath sounds: No wheezing.  Abdominal:     General: Bowel sounds are normal. There is no distension.     Palpations: Abdomen is soft.     Comments: + Colostomy bag   Musculoskeletal:        General: No deformity. Normal range of motion.     Cervical back: Normal range of motion and neck supple.  Skin:    General: Skin is warm and dry.     Findings: Rash present. No erythema.  Neurological:     Mental Status: She is alert and oriented to person, place, and time. Mental status is at baseline.     Cranial Nerves: No cranial nerve deficit.     Coordination: Coordination normal.       LABORATORY DATA:  I have reviewed the data as listed    Latest Ref Rng & Units 11/21/2022    8:05 AM 11/07/2022    7:47 AM 10/24/2022    8:12 AM  CBC  WBC 4.0 - 10.5 K/uL 8.1  4.5  2.9   Hemoglobin 12.0 - 15.0 g/dL 11.6  11.8  11.2   Hematocrit 36.0 - 46.0 % 36.4  35.4  34.9   Platelets 150 - 400 K/uL 230  187  226       Latest Ref Rng & Units 11/21/2022    8:05 AM 11/07/2022    7:47 AM 10/24/2022    8:12 AM  CMP  Glucose 70 - 99 mg/dL 100  106  101   BUN 8 - 23 mg/dL '14  11   17   '$ Creatinine 0.44 - 1.00 mg/dL 0.71  0.73  0.68   Sodium 135 - 145 mmol/L 140  138  136   Potassium 3.5 - 5.1 mmol/L 3.7  3.8  3.5   Chloride 98 - 111 mmol/L 103  101  102   CO2 22 - 32 mmol/L '27  28  28   '$ Calcium 8.9 - 10.3 mg/dL 8.8  9.1  9.0   Total Protein 6.5 - 8.1 g/dL 6.5  6.6  6.3   Total Bilirubin 0.3 - 1.2 mg/dL 0.4  0.4  0.5   Alkaline Phos 38 - 126 U/L 76  74  67   AST 15 - 41 U/L 30  31  42   ALT 0 - 44 U/L '19  20  31      '$ RADIOGRAPHIC STUDIES: I have personally reviewed the radiological images as listed and agreed with the findings in the report. NM PET Image Restag (PS) Skull Base To Thigh  Result Date: 09/20/2022 CLINICAL DATA:  Subsequent treatment strategy for rectal cancer staging in a 77 year old female. EXAM: NUCLEAR MEDICINE PET SKULL BASE TO THIGH TECHNIQUE: 8.74 mCi F-18 FDG was injected intravenously. Full-ring PET imaging was performed from the skull base to thigh after the radiotracer. CT data was obtained and used for attenuation correction and anatomic localization. Fasting blood glucose: 82 mg/dl COMPARISON:  July 17, 2022 FINDINGS: Mediastinal blood pool activity: SUV max 1.55 Liver activity: SUV max NA NECK: No hypermetabolic lymph nodes in the neck. Incidental CT findings: None. CHEST: Scattered small pulmonary nodules largest in the medial RIGHT middle lobe (image 97/2) approximately 7 mm visible FDG uptake in this area is subtle. This dominant nodule is diminished in size compared to more remote imaging from January 19, 2021. These areas accounting for respiratory motion on the current study are not substantially changed compared to recent CT imaging. No adenopathy in the chest. Incidental CT findings: RIGHT Port-A-Cath terminates at the caval to atrial junction. No pericardial effusion. Otherwise unremarkable appearance of the heart great vessels on noncontrast imaging. Lungs are clear and airways are patent aside from pulmonary nodules discussed above.  ABDOMEN/PELVIS: RIGHT groin lymph nodes (image 203/2 11 mm RIGHT inguinal lymph node with a maximum SUV of 5.13. Adjacent smaller lymph nodes with slightly less activity. RIGHT external iliac lymph node (image 196/2) 7 mm short axis with a maximum SUV of 7.71. Incidental CT findings: No acute findings relative to liver, spleen, pancreas, adrenal glands, kidneys, stomach, small or large bowel following APR. LEFT lower quadrant colostomy as on previous imaging. Aortic atherosclerosis. SKELETON: Diffuse marrow space uptake of FDG. Subtle lucency in the RIGHT humeral head as compared to the LEFT humeral head with dramatic differential FDG uptake, more focal appearing on the RIGHT with a maximum SUV of 9.65, uptake in the marrow space of the LEFT proximal humerus is 4.80. Area of abnormality with cortical irregularity of the LEFT posterior iliac showing a maximum SUV of 4.70 similar lucent and sclerotic appearance. Relative lack of uptake in pelvic marrow spaces following radiotherapy. Incidental CT findings: Spinal degenerative changes. IMPRESSION: 1. Signs of nodal disease in the RIGHT inguinal region  and RIGHT pelvis as discussed. 2. Scattered pulmonary nodules indicative of metastatic disease with limited FDG uptake and no substantial change compared to more recent imaging. 3. No signs of solid organ disease. 4. Diffuse marrow space activity could be seen in the setting of marrow stimulation, this confounds further assessment of areas discussed above in the RIGHT proximal humerus and LEFT iliac bone in particular MRI of the RIGHT proximal humerus could be performed particularly if there are new symptoms in this location to assess for any focal underlying signs of bony metastatic disease. Morphologic changes and mild increased metabolic activity on the LEFT as compared to the RIGHT within the iliac crest remains suspicious for metastatic disease. 5. Postoperative changes of APR and LEFT lower quadrant colostomy. 6.  Aortic atherosclerosis. Aortic Atherosclerosis (ICD10-I70.0). Electronically Signed   By: Zetta Bills M.D.   On: 09/20/2022 14:46      NM PET Image Restag (PS) Skull Base To Thigh  Result Date: 09/20/2022 CLINICAL DATA:  Subsequent treatment strategy for rectal cancer staging in a 77 year old female. EXAM: NUCLEAR MEDICINE PET SKULL BASE TO THIGH TECHNIQUE: 8.74 mCi F-18 FDG was injected intravenously. Full-ring PET imaging was performed from the skull base to thigh after the radiotracer. CT data was obtained and used for attenuation correction and anatomic localization. Fasting blood glucose: 82 mg/dl COMPARISON:  July 17, 2022 FINDINGS: Mediastinal blood pool activity: SUV max 1.55 Liver activity: SUV max NA NECK: No hypermetabolic lymph nodes in the neck. Incidental CT findings: None. CHEST: Scattered small pulmonary nodules largest in the medial RIGHT middle lobe (image 97/2) approximately 7 mm visible FDG uptake in this area is subtle. This dominant nodule is diminished in size compared to more remote imaging from January 19, 2021. These areas accounting for respiratory motion on the current study are not substantially changed compared to recent CT imaging. No adenopathy in the chest. Incidental CT findings: RIGHT Port-A-Cath terminates at the caval to atrial junction. No pericardial effusion. Otherwise unremarkable appearance of the heart great vessels on noncontrast imaging. Lungs are clear and airways are patent aside from pulmonary nodules discussed above. ABDOMEN/PELVIS: RIGHT groin lymph nodes (image 203/2 11 mm RIGHT inguinal lymph node with a maximum SUV of 5.13. Adjacent smaller lymph nodes with slightly less activity. RIGHT external iliac lymph node (image 196/2) 7 mm short axis with a maximum SUV of 7.71. Incidental CT findings: No acute findings relative to liver, spleen, pancreas, adrenal glands, kidneys, stomach, small or large bowel following APR. LEFT lower quadrant colostomy as on  previous imaging. Aortic atherosclerosis. SKELETON: Diffuse marrow space uptake of FDG. Subtle lucency in the RIGHT humeral head as compared to the LEFT humeral head with dramatic differential FDG uptake, more focal appearing on the RIGHT with a maximum SUV of 9.65, uptake in the marrow space of the LEFT proximal humerus is 4.80. Area of abnormality with cortical irregularity of the LEFT posterior iliac showing a maximum SUV of 4.70 similar lucent and sclerotic appearance. Relative lack of uptake in pelvic marrow spaces following radiotherapy. Incidental CT findings: Spinal degenerative changes. IMPRESSION: 1. Signs of nodal disease in the RIGHT inguinal region and RIGHT pelvis as discussed. 2. Scattered pulmonary nodules indicative of metastatic disease with limited FDG uptake and no substantial change compared to more recent imaging. 3. No signs of solid organ disease. 4. Diffuse marrow space activity could be seen in the setting of marrow stimulation, this confounds further assessment of areas discussed above in the RIGHT proximal humerus and LEFT  iliac bone in particular MRI of the RIGHT proximal humerus could be performed particularly if there are new symptoms in this location to assess for any focal underlying signs of bony metastatic disease. Morphologic changes and mild increased metabolic activity on the LEFT as compared to the RIGHT within the iliac crest remains suspicious for metastatic disease. 5. Postoperative changes of APR and LEFT lower quadrant colostomy. 6. Aortic atherosclerosis. Aortic Atherosclerosis (ICD10-I70.0). Electronically Signed   By: Zetta Bills M.D.   On: 09/20/2022 14:46

## 2022-11-21 NOTE — Assessment & Plan Note (Signed)
Recommend imodium PRN as instructed.

## 2022-11-21 NOTE — Assessment & Plan Note (Addendum)
potassium is stable.  Continue potassium chloride 20 mEq  daily.  

## 2022-11-23 ENCOUNTER — Inpatient Hospital Stay: Payer: 59

## 2022-11-23 DIAGNOSIS — T451X5A Adverse effect of antineoplastic and immunosuppressive drugs, initial encounter: Secondary | ICD-10-CM | POA: Diagnosis not present

## 2022-11-23 DIAGNOSIS — E876 Hypokalemia: Secondary | ICD-10-CM | POA: Diagnosis not present

## 2022-11-23 DIAGNOSIS — C20 Malignant neoplasm of rectum: Secondary | ICD-10-CM | POA: Diagnosis not present

## 2022-11-23 DIAGNOSIS — Z5112 Encounter for antineoplastic immunotherapy: Secondary | ICD-10-CM | POA: Diagnosis not present

## 2022-11-23 DIAGNOSIS — Z86711 Personal history of pulmonary embolism: Secondary | ICD-10-CM | POA: Diagnosis not present

## 2022-11-23 DIAGNOSIS — I700 Atherosclerosis of aorta: Secondary | ICD-10-CM | POA: Diagnosis not present

## 2022-11-23 DIAGNOSIS — I2510 Atherosclerotic heart disease of native coronary artery without angina pectoris: Secondary | ICD-10-CM | POA: Diagnosis not present

## 2022-11-23 DIAGNOSIS — R634 Abnormal weight loss: Secondary | ICD-10-CM | POA: Diagnosis not present

## 2022-11-23 DIAGNOSIS — K521 Toxic gastroenteritis and colitis: Secondary | ICD-10-CM | POA: Diagnosis not present

## 2022-11-23 DIAGNOSIS — Z79899 Other long term (current) drug therapy: Secondary | ICD-10-CM | POA: Diagnosis not present

## 2022-11-23 DIAGNOSIS — Z933 Colostomy status: Secondary | ICD-10-CM | POA: Diagnosis not present

## 2022-11-23 DIAGNOSIS — D6481 Anemia due to antineoplastic chemotherapy: Secondary | ICD-10-CM | POA: Diagnosis not present

## 2022-11-23 DIAGNOSIS — K219 Gastro-esophageal reflux disease without esophagitis: Secondary | ICD-10-CM | POA: Diagnosis not present

## 2022-11-23 DIAGNOSIS — D701 Agranulocytosis secondary to cancer chemotherapy: Secondary | ICD-10-CM | POA: Diagnosis not present

## 2022-11-23 DIAGNOSIS — I10 Essential (primary) hypertension: Secondary | ICD-10-CM | POA: Diagnosis not present

## 2022-11-23 DIAGNOSIS — E7800 Pure hypercholesterolemia, unspecified: Secondary | ICD-10-CM | POA: Diagnosis not present

## 2022-11-23 DIAGNOSIS — Z800 Family history of malignant neoplasm of digestive organs: Secondary | ICD-10-CM | POA: Diagnosis not present

## 2022-11-23 DIAGNOSIS — C799 Secondary malignant neoplasm of unspecified site: Secondary | ICD-10-CM

## 2022-11-23 DIAGNOSIS — Z87891 Personal history of nicotine dependence: Secondary | ICD-10-CM | POA: Diagnosis not present

## 2022-11-23 DIAGNOSIS — Z5189 Encounter for other specified aftercare: Secondary | ICD-10-CM | POA: Diagnosis not present

## 2022-11-23 LAB — CEA: CEA: 667 ng/mL — ABNORMAL HIGH (ref 0.0–4.7)

## 2022-11-23 MED ORDER — PEGFILGRASTIM INJECTION 6 MG/0.6ML ~~LOC~~
6.0000 mg | PREFILLED_SYRINGE | Freq: Once | SUBCUTANEOUS | Status: AC
  Administered 2022-11-23: 6 mg via SUBCUTANEOUS
  Filled 2022-11-23: qty 0.6

## 2022-11-30 ENCOUNTER — Other Ambulatory Visit: Payer: Self-pay | Admitting: Oncology

## 2022-11-30 ENCOUNTER — Encounter: Payer: Self-pay | Admitting: Oncology

## 2022-11-30 MED ORDER — DIPHENOXYLATE-ATROPINE 2.5-0.025 MG PO TABS: 1.0000 | ORAL_TABLET | Freq: Four times a day (QID) | ORAL | 1 refills | Status: DC | PRN

## 2022-12-04 ENCOUNTER — Other Ambulatory Visit: Payer: Self-pay | Admitting: Family Medicine

## 2022-12-04 DIAGNOSIS — Z1231 Encounter for screening mammogram for malignant neoplasm of breast: Secondary | ICD-10-CM

## 2022-12-04 DIAGNOSIS — Z933 Colostomy status: Secondary | ICD-10-CM | POA: Diagnosis not present

## 2022-12-04 MED FILL — Dexamethasone Sodium Phosphate Inj 100 MG/10ML: INTRAMUSCULAR | Qty: 1 | Status: AC

## 2022-12-05 ENCOUNTER — Encounter: Payer: Self-pay | Admitting: Oncology

## 2022-12-05 ENCOUNTER — Inpatient Hospital Stay: Payer: 59

## 2022-12-05 ENCOUNTER — Inpatient Hospital Stay (HOSPITAL_BASED_OUTPATIENT_CLINIC_OR_DEPARTMENT_OTHER): Payer: 59 | Admitting: Oncology

## 2022-12-05 VITALS — BP 126/69 | HR 64 | Temp 97.4°F | Wt 152.3 lb

## 2022-12-05 DIAGNOSIS — E7800 Pure hypercholesterolemia, unspecified: Secondary | ICD-10-CM | POA: Diagnosis not present

## 2022-12-05 DIAGNOSIS — T451X5A Adverse effect of antineoplastic and immunosuppressive drugs, initial encounter: Secondary | ICD-10-CM

## 2022-12-05 DIAGNOSIS — Z86711 Personal history of pulmonary embolism: Secondary | ICD-10-CM | POA: Diagnosis not present

## 2022-12-05 DIAGNOSIS — Z5111 Encounter for antineoplastic chemotherapy: Secondary | ICD-10-CM

## 2022-12-05 DIAGNOSIS — D6481 Anemia due to antineoplastic chemotherapy: Secondary | ICD-10-CM | POA: Diagnosis not present

## 2022-12-05 DIAGNOSIS — K521 Toxic gastroenteritis and colitis: Secondary | ICD-10-CM

## 2022-12-05 DIAGNOSIS — C20 Malignant neoplasm of rectum: Secondary | ICD-10-CM | POA: Diagnosis not present

## 2022-12-05 DIAGNOSIS — K219 Gastro-esophageal reflux disease without esophagitis: Secondary | ICD-10-CM | POA: Diagnosis not present

## 2022-12-05 DIAGNOSIS — Z800 Family history of malignant neoplasm of digestive organs: Secondary | ICD-10-CM | POA: Diagnosis not present

## 2022-12-05 DIAGNOSIS — Z5112 Encounter for antineoplastic immunotherapy: Secondary | ICD-10-CM | POA: Diagnosis not present

## 2022-12-05 DIAGNOSIS — C799 Secondary malignant neoplasm of unspecified site: Secondary | ICD-10-CM | POA: Diagnosis not present

## 2022-12-05 DIAGNOSIS — I10 Essential (primary) hypertension: Secondary | ICD-10-CM | POA: Diagnosis not present

## 2022-12-05 DIAGNOSIS — Z87891 Personal history of nicotine dependence: Secondary | ICD-10-CM | POA: Diagnosis not present

## 2022-12-05 DIAGNOSIS — Z933 Colostomy status: Secondary | ICD-10-CM | POA: Diagnosis not present

## 2022-12-05 DIAGNOSIS — I2510 Atherosclerotic heart disease of native coronary artery without angina pectoris: Secondary | ICD-10-CM | POA: Diagnosis not present

## 2022-12-05 DIAGNOSIS — E876 Hypokalemia: Secondary | ICD-10-CM

## 2022-12-05 DIAGNOSIS — D701 Agranulocytosis secondary to cancer chemotherapy: Secondary | ICD-10-CM

## 2022-12-05 DIAGNOSIS — R634 Abnormal weight loss: Secondary | ICD-10-CM | POA: Diagnosis not present

## 2022-12-05 DIAGNOSIS — Z5189 Encounter for other specified aftercare: Secondary | ICD-10-CM | POA: Diagnosis not present

## 2022-12-05 DIAGNOSIS — Z79899 Other long term (current) drug therapy: Secondary | ICD-10-CM | POA: Diagnosis not present

## 2022-12-05 DIAGNOSIS — I700 Atherosclerosis of aorta: Secondary | ICD-10-CM | POA: Diagnosis not present

## 2022-12-05 LAB — CBC WITH DIFFERENTIAL/PLATELET
Abs Immature Granulocytes: 0.03 10*3/uL (ref 0.00–0.07)
Basophils Absolute: 0 10*3/uL (ref 0.0–0.1)
Basophils Relative: 0 %
Eosinophils Absolute: 0.2 10*3/uL (ref 0.0–0.5)
Eosinophils Relative: 2 %
HCT: 36.6 % (ref 36.0–46.0)
Hemoglobin: 11.5 g/dL — ABNORMAL LOW (ref 12.0–15.0)
Immature Granulocytes: 0 %
Lymphocytes Relative: 17 %
Lymphs Abs: 1.2 10*3/uL (ref 0.7–4.0)
MCH: 30.3 pg (ref 26.0–34.0)
MCHC: 31.4 g/dL (ref 30.0–36.0)
MCV: 96.3 fL (ref 80.0–100.0)
Monocytes Absolute: 0.6 10*3/uL (ref 0.1–1.0)
Monocytes Relative: 8 %
Neutro Abs: 4.8 10*3/uL (ref 1.7–7.7)
Neutrophils Relative %: 73 %
Platelets: 227 10*3/uL (ref 150–400)
RBC: 3.8 MIL/uL — ABNORMAL LOW (ref 3.87–5.11)
RDW: 15.9 % — ABNORMAL HIGH (ref 11.5–15.5)
WBC: 6.7 10*3/uL (ref 4.0–10.5)
nRBC: 0 % (ref 0.0–0.2)

## 2022-12-05 LAB — MAGNESIUM: Magnesium: 1.7 mg/dL (ref 1.7–2.4)

## 2022-12-05 LAB — COMPREHENSIVE METABOLIC PANEL
ALT: 34 U/L (ref 0–44)
AST: 37 U/L (ref 15–41)
Albumin: 3.7 g/dL (ref 3.5–5.0)
Alkaline Phosphatase: 116 U/L (ref 38–126)
Anion gap: 9 (ref 5–15)
BUN: 12 mg/dL (ref 8–23)
CO2: 27 mmol/L (ref 22–32)
Calcium: 8.7 mg/dL — ABNORMAL LOW (ref 8.9–10.3)
Chloride: 104 mmol/L (ref 98–111)
Creatinine, Ser: 0.75 mg/dL (ref 0.44–1.00)
GFR, Estimated: >60 mL/min (ref >60)
Glucose, Bld: 99 mg/dL (ref 70–99)
Potassium: 3.7 mmol/L (ref 3.5–5.1)
Sodium: 140 mmol/L (ref 135–145)
Total Bilirubin: 0.5 mg/dL (ref 0.3–1.2)
Total Protein: 6.4 g/dL — ABNORMAL LOW (ref 6.5–8.1)

## 2022-12-05 MED ORDER — SODIUM CHLORIDE 0.9 % IV SOLN
6.0000 mg/kg | Freq: Once | INTRAVENOUS | Status: AC
  Administered 2022-12-05: 400 mg via INTRAVENOUS
  Filled 2022-12-05: qty 20

## 2022-12-05 MED ORDER — SODIUM CHLORIDE 0.9% FLUSH
10.0000 mL | INTRAVENOUS | Status: DC | PRN
  Administered 2022-12-05: 10 mL
  Filled 2022-12-05: qty 10

## 2022-12-05 MED ORDER — HEPARIN SOD (PORK) LOCK FLUSH 100 UNIT/ML IV SOLN
500.0000 [IU] | Freq: Once | INTRAVENOUS | Status: AC | PRN
  Administered 2022-12-05: 500 [IU]
  Filled 2022-12-05: qty 5

## 2022-12-05 MED ORDER — PALONOSETRON HCL INJECTION 0.25 MG/5ML
0.2500 mg | Freq: Once | INTRAVENOUS | Status: AC
  Administered 2022-12-05: 0.25 mg via INTRAVENOUS
  Filled 2022-12-05: qty 5

## 2022-12-05 MED ORDER — ATROPINE SULFATE 1 MG/ML IV SOLN
0.5000 mg | Freq: Once | INTRAVENOUS | Status: AC
  Administered 2022-12-05: 0.5 mg via INTRAVENOUS
  Filled 2022-12-05: qty 1

## 2022-12-05 MED ORDER — DIPHENHYDRAMINE HCL 50 MG/ML IJ SOLN
25.0000 mg | Freq: Once | INTRAMUSCULAR | Status: AC
  Administered 2022-12-05: 25 mg via INTRAVENOUS
  Filled 2022-12-05: qty 1

## 2022-12-05 MED ORDER — SODIUM CHLORIDE 0.9 % IV SOLN
100.0000 mg/m2 | Freq: Once | INTRAVENOUS | Status: AC
  Administered 2022-12-05: 180 mg via INTRAVENOUS
  Filled 2022-12-05: qty 5

## 2022-12-05 MED ORDER — SODIUM CHLORIDE 0.9 % IV SOLN
Freq: Once | INTRAVENOUS | Status: AC
  Filled 2022-12-05: qty 250

## 2022-12-05 MED ORDER — SODIUM CHLORIDE 0.9 % IV SOLN
10.0000 mg | Freq: Once | INTRAVENOUS | Status: AC
  Administered 2022-12-05: 10 mg via INTRAVENOUS
  Filled 2022-12-05: qty 10

## 2022-12-05 NOTE — Assessment & Plan Note (Signed)
Hb is stable. observation

## 2022-12-05 NOTE — Assessment & Plan Note (Signed)
Continue  Eliquis 2.5 mg twice daily for anticoagulation prophylaxis.

## 2022-12-05 NOTE — Patient Instructions (Signed)
[facility]  Discharge Instructions: Thank you for choosing [facility] to provide your oncology and hematology care.  If you have a lab appointment with the [facility], please go directly to the [facility] and check in at the registration area.  Wear comfortable clothing and clothing appropriate for easy access to any Portacath or PICC line.   We strive to give you quality time with your provider. You may need to reschedule your appointment if you arrive late (15 or more minutes).  Arriving late affects you and other patients whose appointments are after yours.  Also, if you miss three or more appointments without notifying the office, you may be dismissed from the clinic at the provider's discretion.      For prescription refill requests, have your pharmacy contact our office and allow 72 hours for refills to be completed.    Today you received the following chemotherapy and/or immunotherapy agents VECTIBEX, IRINOTECAN      To help prevent nausea and vomiting after your treatment, we encourage you to take your nausea medication as directed.  BELOW ARE SYMPTOMS THAT SHOULD BE REPORTED IMMEDIATELY: *FEVER GREATER THAN 100.4 F (38 C) OR HIGHER *CHILLS OR SWEATING *NAUSEA AND VOMITING THAT IS NOT CONTROLLED WITH YOUR NAUSEA MEDICATION *UNUSUAL SHORTNESS OF BREATH *UNUSUAL BRUISING OR BLEEDING *URINARY PROBLEMS (pain or burning when urinating, or frequent urination) *BOWEL PROBLEMS (unusual diarrhea, constipation, pain near the anus) TENDERNESS IN MOUTH AND THROAT WITH OR WITHOUT PRESENCE OF ULCERS (sore throat, sores in mouth, or a toothache) UNUSUAL RASH, SWELLING OR PAIN  UNUSUAL VAGINAL DISCHARGE OR ITCHING   Items with * indicate a potential emergency and should be followed up as soon as possible or go to the Emergency Department if any problems should occur.  Please show the CHEMOTHERAPY ALERT CARD or IMMUNOTHERAPY ALERT CARD at  check-in to the Emergency Department and triage nurse.  Should you have questions after your visit or need to cancel or reschedule your appointment, please contact [facility]  [phone] and follow the prompts.  Office hours are 8:00 a.m. to 4:30 p.m. Monday - Friday. Please note that voicemails left after 4:00 p.m. may not be returned until the following business day.  We are closed weekends and major holidays. You have access to a nurse at all times for urgent questions. Please call the main number to the clinic [phone] and follow the prompts.  For any non-urgent questions, you may also contact your provider using MyChart. We now offer e-Visits for anyone 2 and older to request care online for non-urgent symptoms. For details visit [email or web address].   Also download the MyChart app! Go to the app store, search "MyChart", open the app, select , and log in with your MyChart username and password.   Panitumumab Injection What is this medication? PANITUMUMAB (pan i TOOM ue mab) treats colorectal cancer. It works by blocking a protein that causes cancer cells to grow and multiply. This helps to slow or stop the spread of cancer cells. It is a monoclonal antibody. This medicine may be used for other purposes; ask your health care provider or pharmacist if you have questions. COMMON BRAND NAME(S): Vectibix What should I tell my care team before I take this medication? They need to know if you have any of these conditions: Eye disease Low levels of magnesium in the blood Lung disease An unusual or allergic reaction to panitumumab, other medications, foods,  dyes, or preservatives Pregnant or trying to get pregnant Breast-feeding How should I use this medication? This medication is injected into a vein. It is given by your care team in a hospital or clinic setting. Talk to your care team about the use of this medication in children. Special  care may be needed. Overdosage: If you think you have taken too much of this medicine contact a poison control center or emergency room at once. NOTE: This medicine is only for you. Do not share this medicine with others. What if I miss a dose? Keep appointments for follow-up doses. It is important not to miss your dose. Call your care team if you are unable to keep an appointment. What may interact with this medication? Bevacizumab This list may not describe all possible interactions. Give your health care provider a list of all the medicines, herbs, non-prescription drugs, or dietary supplements you use. Also tell them if you smoke, drink alcohol, or use illegal drugs. Some items may interact with your medicine. What should I watch for while using this medication? Your condition will be monitored carefully while you are receiving this medication. This medication may make you feel generally unwell. This is not uncommon as chemotherapy can affect healthy cells as well as cancer cells. Report any side effects. Continue your course of treatment even though you feel ill unless your care team tells you to stop. This medication can make you more sensitive to the sun. Keep out of the sun while receiving this medication and for 2 months after stopping therapy. If you cannot avoid being in the sun, wear protective clothing and sunscreen. Do not use sun lamps, tanning beds, or tanning booths. Check with your care team if you have severe diarrhea, nausea, and vomiting or if you sweat a lot. The loss of too much body fluid may make it dangerous for you to take this medication. This medication may cause serious skin reactions. They can happen weeks to months after starting the medication. Contact your care team right away if you notice fevers or flu-like symptoms with a rash. The rash may be red or purple and then turn into blisters or peeling of the skin. You may also notice a red rash with swelling of the face,  lips, or lymph nodes in your neck or under your arms. Talk to your care team if you may be pregnant. Serious birth defects can occur if you take this medication during pregnancy and for 2 months after the last dose. Contraception is recommended while taking this medication and for 2 months after the last dose. Your care team can help you find the option that works for you. Do not breastfeed while taking this medication and for 2 months after the last dose. This medication may cause infertility. Talk to your care team if you are concerned about your fertility. What side effects may I notice from receiving this medication? Side effects that you should report to your care team as soon as possible: Allergic reactions--skin rash, itching, hives, swelling of the face, lips, tongue, or throat Dry cough, shortness of breath or trouble breathing Eye pain, redness, irritation, or discharge with blurry or decreased vision Infusion reactions--chest pain, shortness of breath or trouble breathing, feeling faint or lightheaded Low magnesium level--muscle pain or cramps, unusual weakness or fatigue, fast or irregular heartbeat, tremors Low potassium level--muscle pain or cramps, unusual weakness or fatigue, fast or irregular heartbeat, constipation Redness, blistering, peeling, or loosening of the skin, including inside the  mouth Skin reactions on sun-exposed areas Side effects that usually do not require medical attention (report to your care team if they continue or are bothersome): Change in nail shape, thickness, or color Diarrhea Dry skin Fatigue Nausea Vomiting This list may not describe all possible side effects. Call your doctor for medical advice about side effects. You may report side effects to FDA at 1-800-FDA-1088. Where should I keep my medication? This medication is given in a hospital or clinic. It will not be stored at home. NOTE: This sheet is a summary. It may not cover all possible  information. If you have questions about this medicine, talk to your doctor, pharmacist, or health care provider.  2023 Elsevier/Gold Standard (2022-02-15 00:00:00)   Irinotecan Injection What is this medication? IRINOTECAN (ir in oh TEE kan) treats some types of cancer. It works by slowing down the growth of cancer cells. This medicine may be used for other purposes; ask your health care provider or pharmacist if you have questions. COMMON BRAND NAME(S): Camptosar What should I tell my care team before I take this medication? They need to know if you have any of these conditions: Dehydration Diarrhea Infection, especially a viral infection, such as chickenpox, cold sores, herpes Liver disease Low blood cell levels (white cells, red cells, and platelets) Low levels of electrolytes, such as calcium, magnesium, or potassium in your blood Recent or ongoing radiation An unusual or allergic reaction to irinotecan, other medications, foods, dyes, or preservatives If you or your partner are pregnant or trying to get pregnant Breast-feeding How should I use this medication? This medication is injected into a vein. It is given by your care team in a hospital or clinic setting. Talk to your care team about the use of this medication in children. Special care may be needed. Overdosage: If you think you have taken too much of this medicine contact a poison control center or emergency room at once. NOTE: This medicine is only for you. Do not share this medicine with others. What if I miss a dose? Keep appointments for follow-up doses. It is important not to miss your dose. Call your care team if you are unable to keep an appointment. What may interact with this medication? Do not take this medication with any of the following: Cobicistat Itraconazole This medication may also interact with the following: Certain antibiotics, such as clarithromycin, rifampin, rifabutin Certain antivirals for HIV  or AIDS Certain medications for fungal infections, such as ketoconazole, posaconazole, voriconazole Certain medications for seizures, such as carbamazepine, phenobarbital, phenytoin Gemfibrozil Nefazodone St. John's wort This list may not describe all possible interactions. Give your health care provider a list of all the medicines, herbs, non-prescription drugs, or dietary supplements you use. Also tell them if you smoke, drink alcohol, or use illegal drugs. Some items may interact with your medicine. What should I watch for while using this medication? Your condition will be monitored carefully while you are receiving this medication. You may need blood work while taking this medication. This medication may make you feel generally unwell. This is not uncommon as chemotherapy can affect healthy cells as well as cancer cells. Report any side effects. Continue your course of treatment even though you feel ill unless your care team tells you to stop. This medication can cause serious side effects. To reduce the risk, your care team may give you other medications to take before receiving this one. Be sure to follow the directions from your care team. This medication  may affect your coordination, reaction time, or judgement. Do not drive or operate machinery until you know how this medication affects you. Sit up or stand slowly to reduce the risk of dizzy or fainting spells. Drinking alcohol with this medication can increase the risk of these side effects. This medication may increase your risk of getting an infection. Call your care team for advice if you get a fever, chills, sore throat, or other symptoms of a cold or flu. Do not treat yourself. Try to avoid being around people who are sick. Avoid taking medications that contain aspirin, acetaminophen, ibuprofen, naproxen, or ketoprofen unless instructed by your care team. These medications may hide a fever. This medication may increase your risk to  bruise or bleed. Call your care team if you notice any unusual bleeding. Be careful brushing or flossing your teeth or using a toothpick because you may get an infection or bleed more easily. If you have any dental work done, tell your dentist you are receiving this medication. Talk to your care team if you or your partner are pregnant or think either of you might be pregnant. This medication can cause serious birth defects if taken during pregnancy and for 6 months after the last dose. You will need a negative pregnancy test before starting this medication. Contraception is recommended while taking this medication and for 6 months after the last dose. Your care team can help you find the option that works for you. Do not father a child while taking this medication and for 3 months after the last dose. Use a condom for contraception during this time period. Do not breastfeed while taking this medication and for 7 days after the last dose. This medication may cause infertility. Talk to your care team if you are concerned about your fertility. What side effects may I notice from receiving this medication? Side effects that you should report to your care team as soon as possible: Allergic reactions--skin rash, itching, hives, swelling of the face, lips, tongue, or throat Dry cough, shortness of breath or trouble breathing Increased saliva or tears, increased sweating, stomach cramping, diarrhea, small pupils, unusual weakness or fatigue, slow heartbeat Infection--fever, chills, cough, sore throat, wounds that don't heal, pain or trouble when passing urine, general feeling of discomfort or being unwell Kidney injury--decrease in the amount of urine, swelling of the ankles, hands, or feet Low red blood cell level--unusual weakness or fatigue, dizziness, headache, trouble breathing Severe or prolonged diarrhea Unusual bruising or bleeding Side effects that usually do not require medical attention (report  to your care team if they continue or are bothersome): Constipation Diarrhea Hair loss Loss of appetite Nausea Stomach pain This list may not describe all possible side effects. Call your doctor for medical advice about side effects. You may report side effects to FDA at 1-800-FDA-1088. Where should I keep my medication? This medication is given in a hospital or clinic. It will not be stored at home. NOTE: This sheet is a summary. It may not cover all possible information. If you have questions about this medicine, talk to your doctor, pharmacist, or health care provider.  2023 Elsevier/Gold Standard (2022-02-09 00:00:00)

## 2022-12-05 NOTE — Assessment & Plan Note (Signed)
Recommend lomotil  QID PRN as instructed.

## 2022-12-05 NOTE — Assessment & Plan Note (Addendum)
History of stage IIIC Rectal cancer, s/p TNT, followed by 09/17/19 APR/posterior vaginectomy/TAH/BSO/VY-flap, pT4b pN0 with close vaginal margin 0.2 mm.  Uterus and ovaries negative for malignancy. palliative radiation to vaginal recurrence- 01/19/21 recurrence with lung metastasis.-Palliative -FOLFIRI plus bevacizumab.  Irinotecan was dropped in November 2022 due to side effects. Negative for UGT1A1*28 - radiographically stable, rise of CEA-July 2023 CT lung metastasis worse--> Dec 2023 PET showed progression in pelvic lymph nodes and bone lesions,--> 2nd line irinotecan +panitumumab Labs are reviewed and discussed with patient, CEA is trending down.  She tolerates treatments with some side effects.  Proceed with  irinotecan +panitumumab Day 3 GCSF due to neutropenia Repeat CT scan in March 2024

## 2022-12-05 NOTE — Assessment & Plan Note (Signed)
potassium is stable.  Continue potassium chloride 20 mEq  daily.

## 2022-12-05 NOTE — Patient Instructions (Signed)

## 2022-12-05 NOTE — Assessment & Plan Note (Signed)
patient will receive long-acting GCSF prophylaxis on D3

## 2022-12-05 NOTE — Progress Notes (Signed)
Hematology/Oncology Progress note [phone] [phone]      CHIEF COMPLAINTS/REASON FOR VISIT:  Follow up for rectal cancer  ASSESSMENT & PLAN:   Cancer Staging  Rectal cancer [facility]) Staging form: Colon and Rectum, AJCC 8th Edition - Pathologic stage from 10/06/2019: Stage IIC (ypT4b, pN0, cM0) - Signed by Earlie Server, MD on 10/06/2019 - Clinical stage from 06/30/2020: Jacqueline Yoder - Signed by Earlie Server, MD on 04/17/2022 - Pathologic: Stage Unknown (rpTX, pNX, cM1) - Signed by Earlie Server, MD on 01/31/2021   Rectal cancer [facility]) History of stage IIIC Rectal cancer, s/p TNT, followed by 09/17/19 APR/posterior vaginectomy/TAH/BSO/VY-flap, pT4b pN0 with close vaginal margin 0.2 mm.  Uterus and ovaries negative for malignancy. palliative radiation to vaginal recurrence- 01/19/21 recurrence with lung metastasis.-Palliative -FOLFIRI plus bevacizumab.  Irinotecan was dropped in November 2022 due to side effects. Negative for UGT1A1*28 - radiographically stable, rise of CEA-July 2023 CT lung metastasis worse--> Dec 2023 PET showed progression in pelvic lymph nodes and bone lesions,--> 2nd line irinotecan +panitumumab Labs are reviewed and discussed with patient, CEA is trending down.  She tolerates treatments with some side effects.  Proceed with  irinotecan +panitumumab Day 3 GCSF due to neutropenia Repeat CT scan in March 2024  Anemia due to antineoplastic chemotherapy Hb is stable. observation  Chemotherapy induced diarrhea Recommend lomotil  QID PRN as instructed.   Chemotherapy induced neutropenia (HCC) patient will receive long-acting GCSF prophylaxis on D3  Encounter for antineoplastic chemotherapy Chemotherapy plan as listed above.   History of pulmonary embolism Continue  Eliquis 2.5 mg twice daily for anticoagulation prophylaxis.  Hypokalemia  potassium is stable.  Continue potassium chloride 20 mEq  daily.    Follow up 2 weeks Lab MD irinotecan +  panitumumab All questions were answered. The patient knows to call the clinic with any problems, questions or concerns.  Earlie Server, MD, PhD [facility] Hematology Oncology 12/05/2022      HISTORY OF PRESENTING ILLNESS:  Patient initially presented with complaints of postmenopausal bleeding on 08/16/2018.  History of was menopausal vaginal bleeding in 2016 which resulted in cervical polypectomy.  Pathology 02/04/2015 showed cervical polyp, consistent with benign endometrial polyp.  [name] after polypectomy due to anxiety associated with pelvic exams.  pelvic exam on 08/16/2018 reviewed cervical abnormality and from enlarged uterus. Seen by Dr. Marcelline Mates on 10/29/2018.  Endometrial biopsy and a Pap smear was performed. 10/29/2018 Pap smear showed adenocarcinoma, favor endometrial origin. 10/29/2018 endometrial biopsy showed endometrioid carcinoma, FIGO grade 1.  10/29/2018- TA & TV Ultrasound revealed: Anteverted uterus measuring 8.7 x 5.6 x 6.4 cm without evidence of focal masses.  The endometrium measuring 24.1 mm (thickened) and heterogeneous.  Right and left ovaries not visualized.  No adnexal masses identified.  No free fluid in cul-de-sac.  Patient was seen by Dr. Theora Gianotti in clinic on 11/13/2018.  Cervical exam reveals 2 cm exophytic irregular mass consistent with malignancy.   11/19/2018 CT chest abdomen pelvis with contrast showed thickened endometrium with some irregularity compatible with the provided diagnosis of endometrial malignancy.  There is a mildly prominent left inguinal node 1.4 cm.  Patient was seen by Dr. Fransisca Connors on 11/20/2018 and left groin lymph node biopsy was recommended.  11/26/2018 patient underwent left inguinal lymph node biopsy. Pathology showed metastatic adenocarcinoma consistent with colorectal origin.  CDX 2+.  Case was discussed on tumor board.  Recommend colonoscopy for further evaluation.  Patient reports significant weight loss 30 pounds over the last  year.  Chronic vaginal spotting. Change of bowel habits the past few months.  More constipated.  Family history positive for brother who has colon cancer prostate cancer.  patient has underwent colonoscopy on 12/03/2018 which reviewed a nonobstructing large mass in the rectum.  Also chronic fistula.  Mass was not circumferential.  This was biopsied with a cold forceps for histology.  Pathology came back hyperplastic polyp negative for dysplasia and malignancy. [name], patient underwent flex sigmoidoscopy on 12/06/2018 with rebiopsy of the rectal mass. This time biopsy results came back positive for invasive colorectal adenocarcinoma, moderately differentiated. Immunotherapy for nearly mismatch repair protein (MMR ) was performed.  There is no loss of MMR expression.  low probability of MSI high.   # Seen by [name] for evaluation of resectability for rectal cancer. In addition, she also had a second opinion with [facility] where her endometrial biopsy pathology was changed to  adenocarcinoma, consistent with colorectal primary.   [name]. She has home health that has been assisting with ostomy care  Patient was also evaluated by [facility] oncology.  Recommendation is to proceed with TNT with concurrent chemoradiation followed by neoadjuvant chemotherapy followed by surgical resection. Patient prefers to have treatment done locally with [facility].   # Oncology Treatment:  02/03/2019- 03/19/2019  concurrent Xeloda and radiation.  Xeloda dose 871m /m2 BID - rounded to 16553mBID- on days of radiation. 04/09/2019, started on FOLFOX with bolus early.  Omitted.  07/16/2019 finished 8 cycles of FOLFOX. 09/17/19 APR/posterior vaginectomy/TAH/BSO/VY-flap pT4b pN0 with close vaginal margin 0.2 mm.  Uterus and ovaries negative for malignancy. Patient reports bilateral lower extremity numbness and tingling, intermittent, left worse than right. She  has lost a lot of weight since her APR surgery.   #Family history with half brother having's history of colon cancer prostate cancer.  Personal history of colorectal cancer.  Patient has not decided if she wants genetic testing.    # history of PE( 01/13/2020)  in the bilateral lower extremity DVT (01/13/2020).   She finishes 6 months of anticoagulation with Eliquis 5 mg twice daily. Now switched to Eliquis 2.5 mg twice daily..  # She has now developed recurrent disease. #06/30/20  vaginal introitus mass biopsied. Pathology is consistent with metastatic colorectal adenocarcinoma I have discussed with [name]  Dr. MaHester Matesnd the mass is not resectable. Patient has also had colonoscopy by Dr. AnVicente Malesesterday. Normal examination. # 07/16/2020 cycle 1 FOLFIRI  # 07/20/2020 PET scan was done for further evaluation, images are consistent with local recurrence, no distant metastasis. #Discussed with radiation oncology Dr. ChBaruch Goutyill recommends concurrent chemotherapy and radiation. 08/02/2020-08/16/2020, patient starts radiation.  Xeloda was held due to neutropenia 08/17/2020,-09/06/2020 Xeloda 1500 mg twice daily concurrently with radiation  01/31/21 started on FOLFIRI + Bev 05/18/2021 CT chest abdomen pelvis showed Previously noted enlargement of bilateral inguinal lymph nodes is resolved, consistent with treatment response of nodal metastatic disease. Interval decrease in size of multiple small bilateral pulmonary nodules, consistent with treatment response of pulmonary metastatic disease. No evidence of new metastatic disease. 05/24/2021 - 08/30/2021, continued on FOLFIRI plus bevacizumab.  Irinotecan dose was reduced, eventually 10030m2  09/02/2021, CT chest abdomen pelvis without contrast Showed small bilateral pulmonary nodules, unchanged.  Stable metastatic disease.  No noncontrast evidence of new metastatic disease in the chest abdomen pelvis.  Small parastomal hernia.  Enlargement of main  pulmonary artery.  Coronary artery disease.  09/13/2021, maintenance 5-FU/bevacizumab 11/28/2021, 5-FU/Irinotecan/bevacizumab.  Irinotecan 100 mg/m2 was added back due to progressively increasing CEA.  INTERVAL HISTORY Jacqueline Yoder is a 77 y.o. female who has above history reviewed by me presents for follow-up of rectal cancer. On Irinotecan and panitumumab. she tolerates well.  + some days she has increased  ostomy output, she takes lomotil PRN Denies any nausea vomiting abdominal pain  She has developed acne rash on her face and neck, better  She has stable weight.     Review of Systems  Constitutional:  Positive for fatigue. Negative for appetite change, chills, fever and unexpected weight change.  HENT:   Negative for hearing loss and voice change.   Eyes:  Negative for eye problems.  Respiratory:  Negative for chest tightness and cough.   Cardiovascular:  Negative for chest pain.  Gastrointestinal:  Negative for abdominal distention, abdominal pain, blood in stool, constipation, diarrhea and nausea.  Endocrine: Negative for hot flashes.  Genitourinary:  Negative for difficulty urinating and frequency.   Musculoskeletal:  Positive for arthralgias.  Skin:  Positive for rash. Negative for itching.  Neurological:  Negative for extremity weakness and numbness.  Hematological:  Negative for adenopathy.  Psychiatric/Behavioral:  Negative for confusion.     MEDICAL HISTORY:  Past Medical History:  Diagnosis Date   Allergy    Arthritis    Blood clot in vein    Family history of colon cancer    GERD (gastroesophageal reflux disease)    Hypercholesteremia    Hypertension    Hypertension    Lower extremity edema    Personal history of chemotherapy    Rectal cancer ([place]) 12/2018   Urinary incontinence     SURGICAL HISTORY: Past Surgical History:  Procedure Laterality Date   ABDOMINAL HYSTERECTOMY     CHOLECYSTECTOMY  1971   COLONOSCOPY WITH PROPOFOL N/A 12/03/2018    Procedure: COLONOSCOPY WITH PROPOFOL;  Surgeon: Lucilla Lame, MD;  Location: [facility] ENDOSCOPY;  Service: Endoscopy;  Laterality: N/A;   COLONOSCOPY WITH PROPOFOL N/A 07/15/2020   Procedure: COLONOSCOPY WITH PROPOFOL;  Surgeon: [name], MD;  Location: [facility] ENDOSCOPY;  Service: Gastroenterology;  Laterality: N/A;   FLEXIBLE SIGMOIDOSCOPY N/A 12/06/2018   Procedure: FLEXIBLE SIGMOIDOSCOPY;  Surgeon: [name], MD;  Location: [facility] ENDOSCOPY;  Service: Endoscopy;  Laterality: N/A;   LAPAROSCOPIC COLOSTOMY  01/06/2019   PORTACATH PLACEMENT N/A 04/03/2019   Procedure: INSERTION PORT-A-CATH;  Surgeon: Jules Husbands, MD;  Location: [facility] ORS;  Service: General;  Laterality: N/A;    SOCIAL HISTORY: Social History   Socioeconomic History   Marital status: Widowed    Spouse name: Not on file   Number of children: Not on file   Years of education: Not on file   Highest education level: Not on file  Occupational History   Not on file  Tobacco Use   Smoking status: Former    Types: Cigarettes    Quit date: 12/02/1977    Years since quitting: 45.0   Smokeless tobacco: Former  Scientific laboratory technician Use: Never used  Substance and Sexual Activity   Alcohol use: Never   Drug use: Never   Sexual activity: Not Currently    Birth control/protection: None  Other Topics Concern   Not on file  Social History Narrative   Lives with daughter   Social Determinants of Health   Financial Resource Strain: Medium Risk (06/30/2022)   Overall Financial Resource Strain (CARDIA)    Difficulty of Paying Living Expenses: Somewhat hard  Food Insecurity: No  Food Insecurity (06/30/2022)   Hunger Vital Sign    Worried About Running Out of Food in the Last Year: Never true    Ran Out of Food in the Last Year: Never true  Transportation Needs: No Transportation Needs (06/30/2022)   PRAPARE - Hydrologist (Medical): No    Lack of Transportation (Non-Medical): No  Physical Activity:  Inactive (06/30/2022)   Exercise Vital Sign    Days of Exercise per Week: 0 days    Minutes of Exercise per Session: 0 min  Stress: No Stress Concern Present (06/30/2022)   [place]    Feeling of Stress : Not at all  Social Connections: Moderately Isolated (06/30/2022)   Social Connection and Isolation Panel [NHANES]    Frequency of Communication with Friends and Family: More than three times a week    Frequency of Social Gatherings with Friends and Family: Twice a week    Attends Religious Services: More than 4 times per year    Active Member of [employer] or Organizations: No    Attends Archivist Meetings: Never    Marital Status: Widowed  Intimate Partner Violence: Not At Risk (06/30/2022)   Humiliation, Afraid, Rape, and Kick questionnaire    Fear of Current or Ex-Partner: No    Emotionally Abused: No    Physically Abused: No    Sexually Abused: No    FAMILY HISTORY: Family History  Problem Relation Age of Onset   Colon cancer Brother 1       exposure to chemicals [place]   Hypertension Mother    Stroke Mother    Kidney failure Father    Breast cancer Neg Hx    Ovarian cancer Neg Hx     ALLERGIES:  is allergic to sulfamethoxazole-trimethoprim.  MEDICATIONS:  Current Outpatient Medications  Medication Sig Dispense Refill   Cholecalciferol (VITAMIN D3) 2000 units capsule Take 2,000 Units by mouth daily.     cholestyramine (QUESTRAN) 4 g packet Take 1 packet (4 g total) by mouth 3 (three) times daily. 90 each 1   clindamycin (CLINDAGEL) 1 % gel Apply topically 2 (two) times daily. 30 g 1   diclofenac sodium (VOLTAREN) 1 % GEL Apply 2 g topically 4 (four) times daily as needed (joint pain).  11   diphenoxylate-atropine (LOMOTIL) 2.5-0.025 MG tablet Take 1 tablet by mouth 4 (four) times daily as needed for diarrhea or loose stools. 60 tablet 1   ELIQUIS 2.5 MG TABS tablet TAKE 1 TABLET BY MOUTH TWICE   DAILY 200 tablet 2   fluticasone (FLONASE) 50 MCG/ACT nasal spray USE 1 SPRAY IN EACH NOSTRIL ONCE DAILY 16 g 3   gabapentin (NEURONTIN) 100 MG capsule Take 1 capsule (100 mg total) by mouth at bedtime. 30 capsule 0   lidocaine-prilocaine (EMLA) cream Apply 1 application. topically as needed. 30 g 6   loperamide (IMODIUM) 2 MG capsule Take 1 capsule (2 mg total) by mouth See admin instructions. With onset of loose stool, take 67m followed by 240mevery 2 hours,  Maximum: 16 mg/day 120 capsule 1   loratadine (CLARITIN) 10 MG tablet Take 10 mg by mouth daily.     magnesium chloride (SLOW-MAG) 64 MG TBEC SR tablet Take 1 tablet (64 mg total) by mouth daily. 30 tablet 1   Multiple Vitamins-Minerals (ONE-A-DAY WOMENS 50 PLUS PO) Take 1 tablet by mouth daily.      potassium chloride SA (KLOR-CON M)  20 MEQ tablet TAKE 1 TABLET BY MOUTH DAILY 90 tablet 1   simvastatin (ZOCOR) 40 MG tablet Take 1 tablet (40 mg total) by mouth at bedtime. 100 tablet 3   triamcinolone cream (KENALOG) 0.5 % Apply 1 Application topically 2 (two) times daily. To affected areas, for up to 2 weeks. 30 g 2   triamterene-hydrochlorothiazide (DYAZIDE) 37.5-25 MG capsule Take 1 each (1 capsule total) by mouth daily. 100 capsule 3   zinc gluconate 50 MG tablet Take 50 mg by mouth daily.     benzonatate (TESSALON) 200 MG capsule Take 1 capsule (200 mg total) by mouth 3 (three) times daily as needed for cough. (Patient not taking: Reported on 10/10/2022) 30 capsule 0   No current facility-administered medications for this visit.   Facility-Administered Medications Ordered in Other Visits  Medication Dose Route Frequency Provider Last Rate Last Admin   sodium chloride flush (NS) 0.9 % injection 10 mL  10 mL Intracatheter PRN Earlie Server, MD   10 mL at 12/05/22 1225     PHYSICAL EXAMINATION: ECOG PERFORMANCE STATUS: 1 - Symptomatic but completely ambulatory  Physical Exam Constitutional:      General: She is not in acute  distress. HENT:     Head: Normocephalic and atraumatic.  Eyes:     General: No scleral icterus. Cardiovascular:     Rate and Rhythm: Normal rate.     Heart sounds: Normal heart sounds.  Pulmonary:     Effort: Pulmonary effort is normal. No respiratory distress.     Breath sounds: No wheezing.  Abdominal:     General: Bowel sounds are normal. There is no distension.     Palpations: Abdomen is soft.     Comments: + Colostomy bag   Musculoskeletal:        General: No deformity. Normal range of motion.     Cervical back: Normal range of motion and neck supple.  Skin:    General: Skin is warm and dry.     Findings: Rash present. No erythema.  Neurological:     Mental Status: She is alert and oriented to person, place, and time. Mental status is at baseline.     Cranial Nerves: No cranial nerve deficit.     Coordination: Coordination normal.       LABORATORY DATA:  I have reviewed the data as listed    Latest Ref Rng & Units 12/05/2022    8:07 AM 11/21/2022    8:05 AM 11/07/2022    7:47 AM  CBC  WBC 4.0 - 10.5 K/uL 6.7  8.1  4.5   Hemoglobin 12.0 - 15.0 g/dL 11.5  11.6  11.8   Hematocrit 36.0 - 46.0 % 36.6  36.4  35.4   Platelets 150 - 400 K/uL 227  230  187       Latest Ref Rng & Units 12/05/2022    8:07 AM 11/21/2022    8:05 AM 11/07/2022    7:47 AM  CMP  Glucose 70 - 99 mg/dL 99  100  106   BUN 8 - 23 mg/dL 12  14  11   $ Creatinine 0.44 - 1.00 mg/dL 0.75  0.71  0.73   Sodium 135 - 145 mmol/L 140  140  138   Potassium 3.5 - 5.1 mmol/L 3.7  3.7  3.8   Chloride 98 - 111 mmol/L 104  103  101   CO2 22 - 32 mmol/L 27  27  28   $ Calcium 8.9 -  10.3 mg/dL 8.7  8.8  9.1   Total Protein 6.5 - 8.1 g/dL 6.4  6.5  6.6   Total Bilirubin 0.3 - 1.2 mg/dL 0.5  0.4  0.4   Alkaline Phos 38 - 126 U/L 116  76  74   AST 15 - 41 U/L 37  30  31   ALT 0 - 44 U/L 34  19  20      RADIOGRAPHIC STUDIES: I have personally reviewed the radiological images as listed and agreed with the findings in  the report. NM PET Image Restag (PS) Skull Base To Thigh  Result Date: 09/20/2022 CLINICAL DATA:  Subsequent treatment strategy for rectal cancer staging in a 77 year old female. EXAM: NUCLEAR MEDICINE PET SKULL BASE TO THIGH TECHNIQUE: 8.74 mCi F-18 FDG was injected intravenously. Full-ring PET imaging was performed from the skull base to thigh after the radiotracer. CT data was obtained and used for attenuation correction and anatomic localization. Fasting blood glucose: 82 mg/dl COMPARISON:  July 17, 2022 FINDINGS: Mediastinal blood pool activity: SUV max 1.55 Liver activity: SUV max NA NECK: No hypermetabolic lymph nodes in the neck. Incidental CT findings: None. CHEST: Scattered small pulmonary nodules largest in the medial RIGHT middle lobe (image 97/2) approximately 7 mm visible FDG uptake in this area is subtle. This dominant nodule is diminished in size compared to more remote imaging from January 19, 2021. These areas accounting for respiratory motion on the current study are not substantially changed compared to recent CT imaging. No adenopathy in the chest. Incidental CT findings: RIGHT Port-A-Cath terminates at the caval to atrial junction. No pericardial effusion. Otherwise unremarkable appearance of the heart great vessels on noncontrast imaging. Lungs are clear and airways are patent aside from pulmonary nodules discussed above. ABDOMEN/PELVIS: RIGHT groin lymph nodes (image 203/2 11 mm RIGHT inguinal lymph node with a maximum SUV of 5.13. Adjacent smaller lymph nodes with slightly less activity. RIGHT external iliac lymph node (image 196/2) 7 mm short axis with a maximum SUV of 7.71. Incidental CT findings: No acute findings relative to liver, spleen, pancreas, adrenal glands, kidneys, stomach, small or large bowel following APR. LEFT lower quadrant colostomy as on previous imaging. Aortic atherosclerosis. SKELETON: Diffuse marrow space uptake of FDG. Subtle lucency in the RIGHT humeral head as  compared to the LEFT humeral head with dramatic differential FDG uptake, more focal appearing on the RIGHT with a maximum SUV of 9.65, uptake in the marrow space of the LEFT proximal humerus is 4.80. Area of abnormality with cortical irregularity of the LEFT posterior iliac showing a maximum SUV of 4.70 similar lucent and sclerotic appearance. Relative lack of uptake in pelvic marrow spaces following radiotherapy. Incidental CT findings: Spinal degenerative changes. IMPRESSION: 1. Signs of nodal disease in the RIGHT inguinal region and RIGHT pelvis as discussed. 2. Scattered pulmonary nodules indicative of metastatic disease with limited FDG uptake and no substantial change compared to more recent imaging. 3. No signs of solid organ disease. 4. Diffuse marrow space activity could be seen in the setting of marrow stimulation, this confounds further assessment of areas discussed above in the RIGHT proximal humerus and LEFT iliac bone in particular MRI of the RIGHT proximal humerus could be performed particularly if there are new symptoms in this location to assess for any focal underlying signs of bony metastatic disease. Morphologic changes and mild increased metabolic activity on the LEFT as compared to the RIGHT within the iliac crest remains suspicious for metastatic disease. 5. Postoperative changes  of APR and LEFT lower quadrant colostomy. 6. Aortic atherosclerosis. Aortic Atherosclerosis (ICD10-I70.0). Electronically Signed   By: Zetta Bills M.D.   On: 09/20/2022 14:46      NM PET Image Restag (PS) Skull Base To Thigh  Result Date: 09/20/2022 CLINICAL DATA:  Subsequent treatment strategy for rectal cancer staging in a 77 year old female. EXAM: NUCLEAR MEDICINE PET SKULL BASE TO THIGH TECHNIQUE: 8.74 mCi F-18 FDG was injected intravenously. Full-ring PET imaging was performed from the skull base to thigh after the radiotracer. CT data was obtained and used for attenuation correction and anatomic  localization. Fasting blood glucose: 82 mg/dl COMPARISON:  July 17, 2022 FINDINGS: Mediastinal blood pool activity: SUV max 1.55 Liver activity: SUV max NA NECK: No hypermetabolic lymph nodes in the neck. Incidental CT findings: None. CHEST: Scattered small pulmonary nodules largest in the medial RIGHT middle lobe (image 97/2) approximately 7 mm visible FDG uptake in this area is subtle. This dominant nodule is diminished in size compared to more remote imaging from January 19, 2021. These areas accounting for respiratory motion on the current study are not substantially changed compared to recent CT imaging. No adenopathy in the chest. Incidental CT findings: RIGHT Port-A-Cath terminates at the caval to atrial junction. No pericardial effusion. Otherwise unremarkable appearance of the heart great vessels on noncontrast imaging. Lungs are clear and airways are patent aside from pulmonary nodules discussed above. ABDOMEN/PELVIS: RIGHT groin lymph nodes (image 203/2 11 mm RIGHT inguinal lymph node with a maximum SUV of 5.13. Adjacent smaller lymph nodes with slightly less activity. RIGHT external iliac lymph node (image 196/2) 7 mm short axis with a maximum SUV of 7.71. Incidental CT findings: No acute findings relative to liver, spleen, pancreas, adrenal glands, kidneys, stomach, small or large bowel following APR. LEFT lower quadrant colostomy as on previous imaging. Aortic atherosclerosis. SKELETON: Diffuse marrow space uptake of FDG. Subtle lucency in the RIGHT humeral head as compared to the LEFT humeral head with dramatic differential FDG uptake, more focal appearing on the RIGHT with a maximum SUV of 9.65, uptake in the marrow space of the LEFT proximal humerus is 4.80. Area of abnormality with cortical irregularity of the LEFT posterior iliac showing a maximum SUV of 4.70 similar lucent and sclerotic appearance. Relative lack of uptake in pelvic marrow spaces following radiotherapy. Incidental CT findings:  Spinal degenerative changes. IMPRESSION: 1. Signs of nodal disease in the RIGHT inguinal region and RIGHT pelvis as discussed. 2. Scattered pulmonary nodules indicative of metastatic disease with limited FDG uptake and no substantial change compared to more recent imaging. 3. No signs of solid organ disease. 4. Diffuse marrow space activity could be seen in the setting of marrow stimulation, this confounds further assessment of areas discussed above in the RIGHT proximal humerus and LEFT iliac bone in particular MRI of the RIGHT proximal humerus could be performed particularly if there are new symptoms in this location to assess for any focal underlying signs of bony metastatic disease. Morphologic changes and mild increased metabolic activity on the LEFT as compared to the RIGHT within the iliac crest remains suspicious for metastatic disease. 5. Postoperative changes of APR and LEFT lower quadrant colostomy. 6. Aortic atherosclerosis. Aortic Atherosclerosis (ICD10-I70.0). Electronically Signed   By: Zetta Bills M.D.   On: 09/20/2022 14:46

## 2022-12-05 NOTE — Assessment & Plan Note (Signed)
Chemotherapy plan as listed above 

## 2022-12-06 LAB — CEA: CEA: 553 ng/mL — ABNORMAL HIGH (ref 0.0–4.7)

## 2022-12-07 ENCOUNTER — Inpatient Hospital Stay: Payer: 59

## 2022-12-07 ENCOUNTER — Other Ambulatory Visit: Payer: Self-pay

## 2022-12-07 DIAGNOSIS — Z86711 Personal history of pulmonary embolism: Secondary | ICD-10-CM | POA: Diagnosis not present

## 2022-12-07 DIAGNOSIS — Z87891 Personal history of nicotine dependence: Secondary | ICD-10-CM | POA: Diagnosis not present

## 2022-12-07 DIAGNOSIS — I700 Atherosclerosis of aorta: Secondary | ICD-10-CM | POA: Diagnosis not present

## 2022-12-07 DIAGNOSIS — Z5112 Encounter for antineoplastic immunotherapy: Secondary | ICD-10-CM | POA: Diagnosis not present

## 2022-12-07 DIAGNOSIS — E7800 Pure hypercholesterolemia, unspecified: Secondary | ICD-10-CM | POA: Diagnosis not present

## 2022-12-07 DIAGNOSIS — C20 Malignant neoplasm of rectum: Secondary | ICD-10-CM

## 2022-12-07 DIAGNOSIS — K219 Gastro-esophageal reflux disease without esophagitis: Secondary | ICD-10-CM | POA: Diagnosis not present

## 2022-12-07 DIAGNOSIS — D701 Agranulocytosis secondary to cancer chemotherapy: Secondary | ICD-10-CM | POA: Diagnosis not present

## 2022-12-07 DIAGNOSIS — C799 Secondary malignant neoplasm of unspecified site: Secondary | ICD-10-CM

## 2022-12-07 DIAGNOSIS — Z933 Colostomy status: Secondary | ICD-10-CM | POA: Diagnosis not present

## 2022-12-07 DIAGNOSIS — T451X5A Adverse effect of antineoplastic and immunosuppressive drugs, initial encounter: Secondary | ICD-10-CM | POA: Diagnosis not present

## 2022-12-07 DIAGNOSIS — K521 Toxic gastroenteritis and colitis: Secondary | ICD-10-CM | POA: Diagnosis not present

## 2022-12-07 DIAGNOSIS — Z800 Family history of malignant neoplasm of digestive organs: Secondary | ICD-10-CM | POA: Diagnosis not present

## 2022-12-07 DIAGNOSIS — Z79899 Other long term (current) drug therapy: Secondary | ICD-10-CM | POA: Diagnosis not present

## 2022-12-07 DIAGNOSIS — I10 Essential (primary) hypertension: Secondary | ICD-10-CM | POA: Diagnosis not present

## 2022-12-07 DIAGNOSIS — I2510 Atherosclerotic heart disease of native coronary artery without angina pectoris: Secondary | ICD-10-CM | POA: Diagnosis not present

## 2022-12-07 DIAGNOSIS — R634 Abnormal weight loss: Secondary | ICD-10-CM | POA: Diagnosis not present

## 2022-12-07 DIAGNOSIS — E876 Hypokalemia: Secondary | ICD-10-CM | POA: Diagnosis not present

## 2022-12-07 DIAGNOSIS — Z5189 Encounter for other specified aftercare: Secondary | ICD-10-CM | POA: Diagnosis not present

## 2022-12-07 DIAGNOSIS — D6481 Anemia due to antineoplastic chemotherapy: Secondary | ICD-10-CM | POA: Diagnosis not present

## 2022-12-07 MED ORDER — PEGFILGRASTIM INJECTION 6 MG/0.6ML ~~LOC~~
6.0000 mg | PREFILLED_SYRINGE | Freq: Once | SUBCUTANEOUS | Status: AC
  Administered 2022-12-07: 6 mg via SUBCUTANEOUS
  Filled 2022-12-07: qty 0.6

## 2022-12-13 ENCOUNTER — Encounter (INDEPENDENT_AMBULATORY_CARE_PROVIDER_SITE_OTHER): Payer: Medicare Other | Admitting: Family Medicine

## 2022-12-13 DIAGNOSIS — B372 Candidiasis of skin and nail: Secondary | ICD-10-CM

## 2022-12-14 MED ORDER — NYSTATIN 100000 UNIT/GM EX POWD: 1.0000 | Freq: Three times a day (TID) | CUTANEOUS | 2 refills | Status: DC

## 2022-12-14 NOTE — Telephone Encounter (Signed)
Please see the MyChart message reply(ies) for my assessment and plan.    This patient gave consent for this Medical Advice Message and is aware that it may result in a bill to [employer], as well as the possibility of receiving a bill for a co-payment or deductible. They are an established patient, but are not seeking medical advice exclusively about a problem treated during an in person or video visit in the last seven days. I did not recommend an in person or video visit within seven days of my reply.    I spent a total of 6 minutes cumulative time within 7 days through [employer].  Nobie Putnam, DO

## 2022-12-14 NOTE — Addendum Note (Signed)
Addended by: Olin Hauser on: 12/14/2022 08:14 AM   Modules accepted: Orders

## 2022-12-18 MED FILL — Dexamethasone Sodium Phosphate Inj 100 MG/10ML: INTRAMUSCULAR | Qty: 1 | Status: AC

## 2022-12-19 ENCOUNTER — Inpatient Hospital Stay (HOSPITAL_BASED_OUTPATIENT_CLINIC_OR_DEPARTMENT_OTHER): Payer: 59 | Admitting: Oncology

## 2022-12-19 ENCOUNTER — Inpatient Hospital Stay: Payer: 59 | Attending: Oncology

## 2022-12-19 ENCOUNTER — Encounter: Payer: Self-pay | Admitting: Oncology

## 2022-12-19 ENCOUNTER — Inpatient Hospital Stay: Payer: 59

## 2022-12-19 VITALS — BP 135/67 | HR 86 | Temp 97.7°F | Wt 154.9 lb

## 2022-12-19 DIAGNOSIS — K5900 Constipation, unspecified: Secondary | ICD-10-CM | POA: Insufficient documentation

## 2022-12-19 DIAGNOSIS — D6481 Anemia due to antineoplastic chemotherapy: Secondary | ICD-10-CM

## 2022-12-19 DIAGNOSIS — T451X5A Adverse effect of antineoplastic and immunosuppressive drugs, initial encounter: Secondary | ICD-10-CM

## 2022-12-19 DIAGNOSIS — Z5111 Encounter for antineoplastic chemotherapy: Secondary | ICD-10-CM | POA: Insufficient documentation

## 2022-12-19 DIAGNOSIS — K435 Parastomal hernia without obstruction or  gangrene: Secondary | ICD-10-CM | POA: Diagnosis not present

## 2022-12-19 DIAGNOSIS — K521 Toxic gastroenteritis and colitis: Secondary | ICD-10-CM

## 2022-12-19 DIAGNOSIS — R634 Abnormal weight loss: Secondary | ICD-10-CM | POA: Diagnosis not present

## 2022-12-19 DIAGNOSIS — K219 Gastro-esophageal reflux disease without esophagitis: Secondary | ICD-10-CM | POA: Insufficient documentation

## 2022-12-19 DIAGNOSIS — C20 Malignant neoplasm of rectum: Secondary | ICD-10-CM | POA: Insufficient documentation

## 2022-12-19 DIAGNOSIS — E7800 Pure hypercholesterolemia, unspecified: Secondary | ICD-10-CM | POA: Diagnosis not present

## 2022-12-19 DIAGNOSIS — Z86711 Personal history of pulmonary embolism: Secondary | ICD-10-CM | POA: Insufficient documentation

## 2022-12-19 DIAGNOSIS — C799 Secondary malignant neoplasm of unspecified site: Secondary | ICD-10-CM

## 2022-12-19 DIAGNOSIS — D701 Agranulocytosis secondary to cancer chemotherapy: Secondary | ICD-10-CM

## 2022-12-19 DIAGNOSIS — R21 Rash and other nonspecific skin eruption: Secondary | ICD-10-CM | POA: Insufficient documentation

## 2022-12-19 DIAGNOSIS — I10 Essential (primary) hypertension: Secondary | ICD-10-CM | POA: Diagnosis not present

## 2022-12-19 DIAGNOSIS — Z800 Family history of malignant neoplasm of digestive organs: Secondary | ICD-10-CM | POA: Diagnosis not present

## 2022-12-19 DIAGNOSIS — E876 Hypokalemia: Secondary | ICD-10-CM | POA: Insufficient documentation

## 2022-12-19 DIAGNOSIS — I2510 Atherosclerotic heart disease of native coronary artery without angina pectoris: Secondary | ICD-10-CM | POA: Insufficient documentation

## 2022-12-19 DIAGNOSIS — Z79899 Other long term (current) drug therapy: Secondary | ICD-10-CM | POA: Insufficient documentation

## 2022-12-19 LAB — CBC WITH DIFFERENTIAL/PLATELET
Abs Immature Granulocytes: 0.03 10*3/uL (ref 0.00–0.07)
Basophils Absolute: 0 10*3/uL (ref 0.0–0.1)
Basophils Relative: 0 %
Eosinophils Absolute: 0.2 10*3/uL (ref 0.0–0.5)
Eosinophils Relative: 3 %
HCT: 35.2 % — ABNORMAL LOW (ref 36.0–46.0)
Hemoglobin: 11 g/dL — ABNORMAL LOW (ref 12.0–15.0)
Immature Granulocytes: 0 %
Lymphocytes Relative: 14 %
Lymphs Abs: 1.1 10*3/uL (ref 0.7–4.0)
MCH: 29.6 pg (ref 26.0–34.0)
MCHC: 31.3 g/dL (ref 30.0–36.0)
MCV: 94.9 fL (ref 80.0–100.0)
Monocytes Absolute: 0.6 10*3/uL (ref 0.1–1.0)
Monocytes Relative: 8 %
Neutro Abs: 5.7 10*3/uL (ref 1.7–7.7)
Neutrophils Relative %: 75 %
Platelets: 216 10*3/uL (ref 150–400)
RBC: 3.71 MIL/uL — ABNORMAL LOW (ref 3.87–5.11)
RDW: 15.9 % — ABNORMAL HIGH (ref 11.5–15.5)
WBC: 7.6 10*3/uL (ref 4.0–10.5)
nRBC: 0 % (ref 0.0–0.2)

## 2022-12-19 LAB — COMPREHENSIVE METABOLIC PANEL
ALT: 23 U/L (ref 0–44)
AST: 30 U/L (ref 15–41)
Albumin: 3.6 g/dL (ref 3.5–5.0)
Alkaline Phosphatase: 83 U/L (ref 38–126)
Anion gap: 8 (ref 5–15)
BUN: 18 mg/dL (ref 8–23)
CO2: 27 mmol/L (ref 22–32)
Calcium: 8.8 mg/dL — ABNORMAL LOW (ref 8.9–10.3)
Chloride: 103 mmol/L (ref 98–111)
Creatinine, Ser: 0.73 mg/dL (ref 0.44–1.00)
GFR, Estimated: >60 mL/min (ref >60)
Glucose, Bld: 100 mg/dL — ABNORMAL HIGH (ref 70–99)
Potassium: 3.7 mmol/L (ref 3.5–5.1)
Sodium: 138 mmol/L (ref 135–145)
Total Bilirubin: 0.4 mg/dL (ref 0.3–1.2)
Total Protein: 6.6 g/dL (ref 6.5–8.1)

## 2022-12-19 LAB — MAGNESIUM: Magnesium: 1.6 mg/dL — ABNORMAL LOW (ref 1.7–2.4)

## 2022-12-19 MED ORDER — ATROPINE SULFATE 1 MG/ML IV SOLN
0.5000 mg | Freq: Once | INTRAVENOUS | Status: AC
  Administered 2022-12-19: 0.5 mg via INTRAVENOUS
  Filled 2022-12-19: qty 1

## 2022-12-19 MED ORDER — PALONOSETRON HCL INJECTION 0.25 MG/5ML
0.2500 mg | Freq: Once | INTRAVENOUS | Status: AC
  Administered 2022-12-19: 0.25 mg via INTRAVENOUS
  Filled 2022-12-19: qty 5

## 2022-12-19 MED ORDER — DIPHENHYDRAMINE HCL 50 MG/ML IJ SOLN
25.0000 mg | Freq: Once | INTRAMUSCULAR | Status: AC
  Administered 2022-12-19: 25 mg via INTRAVENOUS
  Filled 2022-12-19: qty 1

## 2022-12-19 MED ORDER — KETOCONAZOLE 2 % EX CREA: 1.0000 | TOPICAL_CREAM | Freq: Every day | CUTANEOUS | 2 refills | Status: DC

## 2022-12-19 MED ORDER — SODIUM CHLORIDE 0.9 % IV SOLN
6.0000 mg/kg | Freq: Once | INTRAVENOUS | Status: AC
  Administered 2022-12-19: 400 mg via INTRAVENOUS
  Filled 2022-12-19: qty 20

## 2022-12-19 MED ORDER — SODIUM CHLORIDE 0.9 % IV SOLN
100.0000 mg/m2 | Freq: Once | INTRAVENOUS | Status: AC
  Administered 2022-12-19: 180 mg via INTRAVENOUS
  Filled 2022-12-19: qty 5

## 2022-12-19 MED ORDER — HEPARIN SOD (PORK) LOCK FLUSH 100 UNIT/ML IV SOLN
500.0000 [IU] | Freq: Once | INTRAVENOUS | Status: AC | PRN
  Administered 2022-12-19: 500 [IU]
  Filled 2022-12-19: qty 5

## 2022-12-19 MED ORDER — SODIUM CHLORIDE 0.9 % IV SOLN
Freq: Once | INTRAVENOUS | Status: AC
  Filled 2022-12-19: qty 250

## 2022-12-19 MED ORDER — CLINDAMYCIN PHOSPHATE 1 % EX GEL: Freq: Two times a day (BID) | CUTANEOUS | 5 refills | Status: DC

## 2022-12-19 MED ORDER — SODIUM CHLORIDE 0.9 % IV SOLN
10.0000 mg | Freq: Once | INTRAVENOUS | Status: AC
  Administered 2022-12-19: 10 mg via INTRAVENOUS
  Filled 2022-12-19: qty 10

## 2022-12-19 NOTE — Assessment & Plan Note (Signed)
Continue  Eliquis 2.5 mg twice daily for anticoagulation prophylaxis.

## 2022-12-19 NOTE — Patient Instructions (Signed)
Jacqueline Yoder  Discharge Instructions: Thank you for choosing [facility] to provide your oncology and hematology care.  If you have a lab appointment with the [facility], please go directly to the [facility] and check in at the registration area.  Wear comfortable clothing and clothing appropriate for easy access to any Portacath or PICC line.   We strive to give you quality time with your provider. You may need to reschedule your appointment if you arrive late (15 or more minutes).  Arriving late affects you and other patients whose appointments are after yours.  Also, if you miss three or more appointments without notifying the office, you may be dismissed from the clinic at the provider's discretion.      For prescription refill requests, have your pharmacy contact our office and allow 72 hours for refills to be completed.    Today you received the following chemotherapy and/or immunotherapy agents VECTIBEX and IRINOTECAN      To help prevent nausea and vomiting after your treatment, we encourage you to take your nausea medication as directed.  BELOW ARE SYMPTOMS THAT SHOULD BE REPORTED IMMEDIATELY: *FEVER GREATER THAN 100.4 F (38 C) OR HIGHER *CHILLS OR SWEATING *NAUSEA AND VOMITING THAT IS NOT CONTROLLED WITH YOUR NAUSEA MEDICATION *UNUSUAL SHORTNESS OF BREATH *UNUSUAL BRUISING OR BLEEDING *URINARY PROBLEMS (pain or burning when urinating, or frequent urination) *BOWEL PROBLEMS (unusual diarrhea, constipation, pain near the anus) TENDERNESS IN MOUTH AND THROAT WITH OR WITHOUT PRESENCE OF ULCERS (sore throat, sores in mouth, or a toothache) UNUSUAL RASH, SWELLING OR PAIN  UNUSUAL VAGINAL DISCHARGE OR ITCHING   Items with * indicate a potential emergency and should be followed up as soon as possible or go to the Emergency Department if any problems should occur.  Please show the CHEMOTHERAPY ALERT CARD or IMMUNOTHERAPY ALERT CARD  at check-in to the Emergency Department and triage nurse.  Should you have questions after your visit or need to cancel or reschedule your appointment, please contact [facility]  [phone] and follow the prompts.  Office hours are 8:00 a.m. to 4:30 p.m. Monday - Friday. Please note that voicemails left after 4:00 p.m. may not be returned until the following business day.  We are closed weekends and major holidays. You have access to a nurse at all times for urgent questions. Please call the main number to the clinic [phone] and follow the prompts.  For any non-urgent questions, you may also contact your provider using MyChart. We now offer e-Visits for anyone 59 and older to request care online for non-urgent symptoms. For details visit [email or web address].   Also download the MyChart app! Go to the app store, search "MyChart", open the app, select [facility], and log in with your MyChart username and password.   Panitumumab Injection What is this medication? PANITUMUMAB (pan i TOOM ue mab) treats colorectal cancer. It works by blocking a protein that causes cancer cells to grow and multiply. This helps to slow or stop the spread of cancer cells. It is a monoclonal antibody. This medicine may be used for other purposes; ask your health care provider or pharmacist if you have questions. COMMON BRAND NAME(S): Vectibix What should I tell my care team before I take this medication? They need to know if you have any of these conditions: Eye disease Low levels of magnesium in the blood Lung disease An unusual or allergic reaction to panitumumab, other medications,  foods, dyes, or preservatives Pregnant or trying to get pregnant Breast-feeding How should I use this medication? This medication is injected into a vein. It is given by your care team in a hospital or clinic setting. Talk to your care team about the use of this medication in children.  Special care may be needed. Overdosage: If you think you have taken too much of this medicine contact a poison control center or emergency room at once. NOTE: This medicine is only for you. Do not share this medicine with others. What if I miss a dose? Keep appointments for follow-up doses. It is important not to miss your dose. Call your care team if you are unable to keep an appointment. What may interact with this medication? Bevacizumab This list may not describe all possible interactions. Give your health care provider a list of all the medicines, herbs, non-prescription drugs, or dietary supplements you use. Also tell them if you smoke, drink alcohol, or use illegal drugs. Some items may interact with your medicine. What should I watch for while using this medication? Your condition will be monitored carefully while you are receiving this medication. This medication may make you feel generally unwell. This is not uncommon as chemotherapy can affect healthy cells as well as cancer cells. Report any side effects. Continue your course of treatment even though you feel ill unless your care team tells you to stop. This medication can make you more sensitive to the sun. Keep out of the sun while receiving this medication and for 2 months after stopping therapy. If you cannot avoid being in the sun, wear protective clothing and sunscreen. Do not use sun lamps, tanning beds, or tanning booths. Check with your care team if you have severe diarrhea, nausea, and vomiting or if you sweat a lot. The loss of too much body fluid may make it dangerous for you to take this medication. This medication may cause serious skin reactions. They can happen weeks to months after starting the medication. Contact your care team right away if you notice fevers or flu-like symptoms with a rash. The rash may be red or purple and then turn into blisters or peeling of the skin. You may also notice a red rash with swelling of the  face, lips, or lymph nodes in your neck or under your arms. Talk to your care team if you may be pregnant. Serious birth defects can occur if you take this medication during pregnancy and for 2 months after the last dose. Contraception is recommended while taking this medication and for 2 months after the last dose. Your care team can help you find the option that works for you. Do not breastfeed while taking this medication and for 2 months after the last dose. This medication may cause infertility. Talk to your care team if you are concerned about your fertility. What side effects may I notice from receiving this medication? Side effects that you should report to your care team as soon as possible: Allergic reactions--skin rash, itching, hives, swelling of the face, lips, tongue, or throat Dry cough, shortness of breath or trouble breathing Eye pain, redness, irritation, or discharge with blurry or decreased vision Infusion reactions--chest pain, shortness of breath or trouble breathing, feeling faint or lightheaded Low magnesium level--muscle pain or cramps, unusual weakness or fatigue, fast or irregular heartbeat, tremors Low potassium level--muscle pain or cramps, unusual weakness or fatigue, fast or irregular heartbeat, constipation Redness, blistering, peeling, or loosening of the skin, including inside  the mouth Skin reactions on sun-exposed areas Side effects that usually do not require medical attention (report to your care team if they continue or are bothersome): Change in nail shape, thickness, or color Diarrhea Dry skin Fatigue Nausea Vomiting This list may not describe all possible side effects. Call your doctor for medical advice about side effects. You may report side effects to FDA at 1-800-FDA-1088. Where should I keep my medication? This medication is given in a hospital or clinic. It will not be stored at home. NOTE: This sheet is a summary. It may not cover all possible  information. If you have questions about this medicine, talk to your doctor, pharmacist, or health care provider.  2023 Elsevier/Gold Standard (2022-02-13 00:00:00)  Irinotecan Injection What is this medication? IRINOTECAN (ir in oh TEE kan) treats some types of cancer. It works by slowing down the growth of cancer cells. This medicine may be used for other purposes; ask your health care provider or pharmacist if you have questions. COMMON BRAND NAME(S): Camptosar What should I tell my care team before I take this medication? They need to know if you have any of these conditions: Dehydration Diarrhea Infection, especially a viral infection, such as chickenpox, cold sores, herpes Liver disease Low blood cell levels (white cells, red cells, and platelets) Low levels of electrolytes, such as calcium, magnesium, or potassium in your blood Recent or ongoing radiation An unusual or allergic reaction to irinotecan, other medications, foods, dyes, or preservatives If you or your partner are pregnant or trying to get pregnant Breast-feeding How should I use this medication? This medication is injected into a vein. It is given by your care team in a hospital or clinic setting. Talk to your care team about the use of this medication in children. Special care may be needed. Overdosage: If you think you have taken too much of this medicine contact a poison control center or emergency room at once. NOTE: This medicine is only for you. Do not share this medicine with others. What if I miss a dose? Keep appointments for follow-up doses. It is important not to miss your dose. Call your care team if you are unable to keep an appointment. What may interact with this medication? Do not take this medication with any of the following: Cobicistat Itraconazole This medication may also interact with the following: Certain antibiotics, such as clarithromycin, rifampin, rifabutin Certain antivirals for HIV or  AIDS Certain medications for fungal infections, such as ketoconazole, posaconazole, voriconazole Certain medications for seizures, such as carbamazepine, phenobarbital, phenytoin Gemfibrozil Nefazodone St. John's wort This list may not describe all possible interactions. Give your health care provider a list of all the medicines, herbs, non-prescription drugs, or dietary supplements you use. Also tell them if you smoke, drink alcohol, or use illegal drugs. Some items may interact with your medicine. What should I watch for while using this medication? Your condition will be monitored carefully while you are receiving this medication. You may need blood work while taking this medication. This medication may make you feel generally unwell. This is not uncommon as chemotherapy can affect healthy cells as well as cancer cells. Report any side effects. Continue your course of treatment even though you feel ill unless your care team tells you to stop. This medication can cause serious side effects. To reduce the risk, your care team may give you other medications to take before receiving this one. Be sure to follow the directions from your care team. This medication  may affect your coordination, reaction time, or judgement. Do not drive or operate machinery until you know how this medication affects you. Sit up or stand slowly to reduce the risk of dizzy or fainting spells. Drinking alcohol with this medication can increase the risk of these side effects. This medication may increase your risk of getting an infection. Call your care team for advice if you get a fever, chills, sore throat, or other symptoms of a cold or flu. Do not treat yourself. Try to avoid being around people who are sick. Avoid taking medications that contain aspirin, acetaminophen, ibuprofen, naproxen, or ketoprofen unless instructed by your care team. These medications may hide a fever. This medication may increase your risk to bruise  or bleed. Call your care team if you notice any unusual bleeding. Be careful brushing or flossing your teeth or using a toothpick because you may get an infection or bleed more easily. If you have any dental work done, tell your dentist you are receiving this medication. Talk to your care team if you or your partner are pregnant or think either of you might be pregnant. This medication can cause serious birth defects if taken during pregnancy and for 6 months after the last dose. You will need a negative pregnancy test before starting this medication. Contraception is recommended while taking this medication and for 6 months after the last dose. Your care team can help you find the option that works for you. Do not father a child while taking this medication and for 3 months after the last dose. Use a condom for contraception during this time period. Do not breastfeed while taking this medication and for 7 days after the last dose. This medication may cause infertility. Talk to your care team if you are concerned about your fertility. What side effects may I notice from receiving this medication? Side effects that you should report to your care team as soon as possible: Allergic reactions--skin rash, itching, hives, swelling of the face, lips, tongue, or throat Dry cough, shortness of breath or trouble breathing Increased saliva or tears, increased sweating, stomach cramping, diarrhea, small pupils, unusual weakness or fatigue, slow heartbeat Infection--fever, chills, cough, sore throat, wounds that don't heal, pain or trouble when passing urine, general feeling of discomfort or being unwell Kidney injury--decrease in the amount of urine, swelling of the ankles, hands, or feet Low red blood cell level--unusual weakness or fatigue, dizziness, headache, trouble breathing Severe or prolonged diarrhea Unusual bruising or bleeding Side effects that usually do not require medical attention (report to your  care team if they continue or are bothersome): Constipation Diarrhea Hair loss Loss of appetite Nausea Stomach pain This list may not describe all possible side effects. Call your doctor for medical advice about side effects. You may report side effects to FDA at 1-800-FDA-1088. Where should I keep my medication? This medication is given in a hospital or clinic. It will not be stored at home. NOTE: This sheet is a summary. It may not cover all possible information. If you have questions about this medicine, talk to your doctor, pharmacist, or health care provider.  2023 Elsevier/Gold Standard (2022-02-09 00:00:00)

## 2022-12-19 NOTE — Assessment & Plan Note (Signed)
Likely secondary to chemotherapy-panitumumab Recommend topical steroid as well as clindamycin gel

## 2022-12-19 NOTE — Assessment & Plan Note (Signed)
Chemotherapy plan as listed above.  

## 2022-12-19 NOTE — Assessment & Plan Note (Signed)
potassium is stable.  Continue potassium chloride 20 mEq  daily.

## 2022-12-19 NOTE — Addendum Note (Signed)
Addended by: Olin Hauser on: 12/19/2022 06:24 PM   Modules accepted: Orders

## 2022-12-19 NOTE — Progress Notes (Signed)
Hematology/Oncology Progress note [phone] [phone]      CHIEF COMPLAINTS/REASON FOR VISIT:  Follow up for rectal cancer  ASSESSMENT & PLAN:   Cancer Staging  Rectal cancer [facility]) Staging form: Colon and Rectum, AJCC 8th Edition - Pathologic stage from 10/06/2019: Stage IIC (ypT4b, pN0, cM0) - Signed by Jacqueline Server, MD on 10/06/2019 - Clinical stage from 06/30/2020: Jacqueline Yoder - Signed by Jacqueline Server, MD on 04/17/2022 - Pathologic: Stage Unknown (rpTX, pNX, cM1) - Signed by Jacqueline Server, MD on 01/31/2021   Rectal cancer [facility]) History of stage IIIC Rectal cancer, s/p TNT, followed by 09/17/19 APR/posterior vaginectomy/TAH/BSO/VY-flap, pT4b pN0 with close vaginal margin 0.2 mm.  Uterus and ovaries negative for malignancy. palliative radiation to vaginal recurrence- 01/19/21 recurrence with lung metastasis.-Palliative -FOLFIRI plus bevacizumab.  Irinotecan was dropped in November 2022 due to side effects. Negative for UGT1A1*28 - radiographically stable, rise of CEA-July 2023 CT lung metastasis worse--> Dec 2023 PET showed progression in pelvic lymph nodes and bone lesions,--> 2nd line irinotecan +panitumumab--> Labs are reviewed and discussed with patient, CEA is trending down.  Proceed with  irinotecan +panitumumab Day 3 GCSF due to neutropenia Repeat CT scan in March 2024  Anemia due to antineoplastic chemotherapy Hb is stable. observation  Chemotherapy induced diarrhea Recommend lomotil  QID PRN as instructed.   Chemotherapy induced neutropenia (HCC) patient will receive long-acting GCSF prophylaxis on D3  Encounter for antineoplastic chemotherapy Chemotherapy plan as listed above.   History of pulmonary embolism Continue  Eliquis 2.5 mg twice daily for anticoagulation prophylaxis.  Hypokalemia  potassium is stable.  Continue potassium chloride 20 mEq  daily.   Skin rash Likely secondary to chemotherapy-panitumumab Recommend topical steroid as well as  clindamycin gel   Follow up 2 weeks Lab MD irinotecan + panitumumab All questions were answered. The patient knows to call the clinic with any problems, questions or concerns.  Jacqueline Server, MD, PhD [facility] Hematology Oncology 12/19/2022      HISTORY OF PRESENTING ILLNESS:  Patient initially presented with complaints of postmenopausal bleeding on 08/16/2018.  History of was menopausal vaginal bleeding in 2016 which resulted in cervical polypectomy.  Pathology 02/04/2015 showed cervical polyp, consistent with benign endometrial polyp.  [name] after polypectomy due to anxiety associated with pelvic exams.  pelvic exam on 08/16/2018 reviewed cervical abnormality and from enlarged uterus. Seen by Jacqueline Yoder on 10/29/2018.  Endometrial biopsy and a Pap smear was performed. 10/29/2018 Pap smear showed adenocarcinoma, favor endometrial origin. 10/29/2018 endometrial biopsy showed endometrioid carcinoma, FIGO grade 1.  10/29/2018- TA & TV Ultrasound revealed: Anteverted uterus measuring 8.7 x 5.6 x 6.4 cm without evidence of focal masses.  The endometrium measuring 24.1 mm (thickened) and heterogeneous.  Right and left ovaries not visualized.  No adnexal masses identified.  No free fluid in cul-de-sac.  Patient was seen by Jacqueline Yoder in clinic on 11/13/2018.  Cervical exam reveals 2 cm exophytic irregular mass consistent with malignancy.   11/19/2018 CT chest abdomen pelvis with contrast showed thickened endometrium with some irregularity compatible with the provided diagnosis of endometrial malignancy.  There is a mildly prominent left inguinal node 1.4 cm.  Patient was seen by Jacqueline Yoder on 11/20/2018 and left groin lymph node biopsy was recommended.  11/26/2018 patient underwent left inguinal lymph node biopsy. Pathology showed metastatic adenocarcinoma consistent with colorectal origin.  CDX 2+.  Case was discussed on tumor board.  Recommend colonoscopy for further evaluation.  Patient  reports significant weight  loss 30 pounds over the last year.  Chronic vaginal spotting. Change of bowel habits the past few months.  More constipated.  Family history positive for brother who has colon cancer prostate cancer.  patient has underwent colonoscopy on 12/03/2018 which reviewed a nonobstructing large mass in the rectum.  Also chronic fistula.  Mass was not circumferential.  This was biopsied with a cold forceps for histology.  Pathology came back hyperplastic polyp negative for dysplasia and malignancy. [name], patient underwent flex sigmoidoscopy on 12/06/2018 with rebiopsy of the rectal mass. This time biopsy results came back positive for invasive colorectal adenocarcinoma, moderately differentiated. Immunotherapy for nearly mismatch repair protein (MMR ) was performed.  There is no loss of MMR expression.  low probability of MSI high.   # Seen by Jacqueline Yoder surgery for evaluation of resectability for rectal cancer. In addition, she also had a second opinion with Jacqueline Yoder pathology where her endometrial biopsy pathology was changed to  adenocarcinoma, consistent with colorectal primary.   [name]. She has home health that has been assisting with ostomy care  Patient was also evaluated by [facility] oncology.  Recommendation is to proceed with TNT with concurrent chemoradiation followed by neoadjuvant chemotherapy followed by surgical resection. Patient prefers to have treatment done locally with [facility].   # Oncology Treatment:  02/03/2019- 03/19/2019  concurrent Xeloda and radiation.  Xeloda dose '825mg'$  /m2 BID - rounded to '1650mg'$  BID- on days of radiation. 04/09/2019, started on FOLFOX with bolus early.  Omitted.  07/16/2019 finished 8 cycles of FOLFOX. 09/17/19 APR/posterior vaginectomy/TAH/BSO/VY-flap pT4b pN0 with close vaginal margin 0.2 mm.  Uterus and ovaries negative for malignancy. Patient reports bilateral lower extremity numbness  and tingling, intermittent, left worse than right. She has lost a lot of weight since her APR surgery.   #Family history with half brother having's history of colon cancer prostate cancer.  Personal history of colorectal cancer.  Patient has not decided if she wants genetic testing.    # history of PE( 01/13/2020)  in the bilateral lower extremity DVT (01/13/2020).   She finishes 6 months of anticoagulation with Eliquis 5 mg twice daily. Now switched to Eliquis 2.5 mg twice daily..  # She has now developed recurrent disease. #06/30/20  vaginal introitus mass biopsied. Pathology is consistent with metastatic colorectal adenocarcinoma I have discussed with Jacqueline Yoder surgery  Dr. Hester Yoder and the mass is not resectable. Patient has also had colonoscopy by Dr. Vicente Males yesterday. Normal examination. # 07/16/2020 cycle 1 FOLFIRI  # 07/20/2020 PET scan was done for further evaluation, images are consistent with local recurrence, no distant metastasis. #Discussed with radiation oncology Dr. Baruch Gouty will recommends concurrent chemotherapy and radiation. 08/02/2020-08/16/2020, patient starts radiation.  Xeloda was held due to neutropenia 08/17/2020,-09/06/2020 Xeloda 1500 mg twice daily concurrently with radiation  01/31/21 started on FOLFIRI + Bev 05/18/2021 CT chest abdomen pelvis showed Previously noted enlargement of bilateral inguinal lymph nodes is resolved, consistent with treatment response of nodal metastatic disease. Interval decrease in size of multiple small bilateral pulmonary nodules, consistent with treatment response of pulmonary metastatic disease. No evidence of new metastatic disease. 05/24/2021 - 08/30/2021, continued on FOLFIRI plus bevacizumab.  Irinotecan dose was reduced, eventually '100mg'$ /m2  09/02/2021, CT chest abdomen pelvis without contrast Showed small bilateral pulmonary nodules, unchanged.  Stable metastatic disease.  No noncontrast evidence of new metastatic disease in the chest abdomen  pelvis.  Small parastomal hernia.  Enlargement of main pulmonary artery.  Coronary  artery disease.  09/13/2021, maintenance 5-FU/bevacizumab 11/28/2021, 5-FU/Irinotecan/bevacizumab.  Irinotecan 100 mg/m2 was added back due to progressively increasing CEA.  INTERVAL HISTORY Jacqueline Yoder is a 77 y.o. female who has above history reviewed by me presents for follow-up of rectal cancer. On Irinotecan and panitumumab. she tolerates well.  + some days she has increased  ostomy output, she takes lomotil PRN Denies any nausea vomiting abdominal pain   acne rash on her face has resolved.  She develops some dry skin as well as skin rash on both lower extremities. She has stable weight.  + Skin yeast infection below bilateral breast and groin area, primary care provider has prescribed nystatin powder    Review of Systems  Constitutional:  Positive for fatigue. Negative for appetite change, chills, fever and unexpected weight change.  HENT:   Negative for hearing loss and voice change.   Eyes:  Negative for eye problems.  Respiratory:  Negative for chest tightness and cough.   Cardiovascular:  Negative for chest pain.  Gastrointestinal:  Negative for abdominal distention, abdominal pain, blood in stool, constipation, diarrhea and nausea.  Endocrine: Negative for hot flashes.  Genitourinary:  Negative for difficulty urinating and frequency.   Musculoskeletal:  Positive for arthralgias.  Skin:  Positive for rash. Negative for itching.  Neurological:  Negative for extremity weakness and numbness.  Hematological:  Negative for adenopathy.  Psychiatric/Behavioral:  Negative for confusion.     MEDICAL HISTORY:  Past Medical History:  Diagnosis Date   Allergy    Arthritis    Blood clot in vein    Family history of colon cancer    GERD (gastroesophageal reflux disease)    Hypercholesteremia    Hypertension    Hypertension    Lower extremity edema    Personal history of chemotherapy    Rectal  cancer ([place]) 12/2018   Urinary incontinence     SURGICAL HISTORY: Past Surgical History:  Procedure Laterality Date   ABDOMINAL HYSTERECTOMY     CHOLECYSTECTOMY  1971   COLONOSCOPY WITH PROPOFOL N/A 12/03/2018   Procedure: COLONOSCOPY WITH PROPOFOL;  Surgeon: Lucilla Lame, MD;  Location: [facility] ENDOSCOPY;  Service: Endoscopy;  Laterality: N/A;   COLONOSCOPY WITH PROPOFOL N/A 07/15/2020   Procedure: COLONOSCOPY WITH PROPOFOL;  Surgeon: [name], MD;  Location: [facility] ENDOSCOPY;  Service: Gastroenterology;  Laterality: N/A;   FLEXIBLE SIGMOIDOSCOPY N/A 12/06/2018   Procedure: FLEXIBLE SIGMOIDOSCOPY;  Surgeon: [name], MD;  Location: [facility] ENDOSCOPY;  Service: Endoscopy;  Laterality: N/A;   LAPAROSCOPIC COLOSTOMY  01/06/2019   PORTACATH PLACEMENT N/A 04/03/2019   Procedure: INSERTION PORT-A-CATH;  Surgeon: Jules Husbands, MD;  Location: [facility] ORS;  Service: General;  Laterality: N/A;    SOCIAL HISTORY: Social History   Socioeconomic History   Marital status: Widowed    Spouse name: Not on file   Number of children: Not on file   Years of education: Not on file   Highest education level: Not on file  Occupational History   Not on file  Tobacco Use   Smoking status: Former    Types: Cigarettes    Quit date: 12/02/1977    Years since quitting: 45.0   Smokeless tobacco: Former  Scientific laboratory technician Use: Never used  Substance and Sexual Activity   Alcohol use: Never   Drug use: Never   Sexual activity: Not Currently    Birth control/protection: None  Other Topics Concern   Not on file  Social History Narrative   Lives  with daughter   Social Determinants of Health   Financial Resource Strain: Medium Risk (06/30/2022)   Overall Financial Resource Strain (CARDIA)    Difficulty of Paying Living Expenses: Somewhat hard  Food Insecurity: No Food Insecurity (06/30/2022)   Hunger Vital Sign    Worried About Running Out of Food in the Last Year: Never true    Ran Out of Food in the  Last Year: Never true  Transportation Needs: No Transportation Needs (06/30/2022)   PRAPARE - Hydrologist (Medical): No    Lack of Transportation (Non-Medical): No  Physical Activity: Inactive (06/30/2022)   Exercise Vital Sign    Days of Exercise per Week: 0 days    Minutes of Exercise per Session: 0 min  Stress: No Stress Concern Present (06/30/2022)   [place]    Feeling of Stress : Not at all  Social Connections: Moderately Isolated (06/30/2022)   Social Connection and Isolation Panel [NHANES]    Frequency of Communication with Friends and Family: More than three times a week    Frequency of Social Gatherings with Friends and Family: Twice a week    Attends Religious Services: More than 4 times per year    Active Member of [employer] or Organizations: No    Attends Archivist Meetings: Never    Marital Status: Widowed  Intimate Partner Violence: Not At Risk (06/30/2022)   Humiliation, Afraid, Rape, and Kick questionnaire    Fear of Current or Ex-Partner: No    Emotionally Abused: No    Physically Abused: No    Sexually Abused: No    FAMILY HISTORY: Family History  Problem Relation Age of Onset   Colon cancer Brother 31       exposure to chemicals [place]   Hypertension Mother    Stroke Mother    Kidney failure Father    Breast cancer Neg Hx    Ovarian cancer Neg Hx     ALLERGIES:  is allergic to sulfamethoxazole-trimethoprim.  MEDICATIONS:  Current Outpatient Medications  Medication Sig Dispense Refill   Cholecalciferol (VITAMIN D3) 2000 units capsule Take 2,000 Units by mouth daily.     cholestyramine (QUESTRAN) 4 g packet Take 1 packet (4 g total) by mouth 3 (three) times daily. 90 each 1   diclofenac sodium (VOLTAREN) 1 % GEL Apply 2 g topically 4 (four) times daily as needed (joint pain).  11   diphenoxylate-atropine (LOMOTIL) 2.5-0.025 MG tablet Take 1 tablet by  mouth 4 (four) times daily as needed for diarrhea or loose stools. 60 tablet 1   ELIQUIS 2.5 MG TABS tablet TAKE 1 TABLET BY MOUTH TWICE  DAILY 200 tablet 2   fluticasone (FLONASE) 50 MCG/ACT nasal spray USE 1 SPRAY IN EACH NOSTRIL ONCE DAILY 16 g 3   gabapentin (NEURONTIN) 100 MG capsule Take 1 capsule (100 mg total) by mouth at bedtime. 30 capsule 0   lidocaine-prilocaine (EMLA) cream Apply 1 application. topically as needed. 30 g 6   loperamide (IMODIUM) 2 MG capsule Take 1 capsule (2 mg total) by mouth See admin instructions. With onset of loose stool, take '4mg'$  followed by '2mg'$  every 2 hours,  Maximum: 16 mg/day 120 capsule 1   loratadine (CLARITIN) 10 MG tablet Take 10 mg by mouth daily.     magnesium chloride (SLOW-MAG) 64 MG TBEC SR tablet Take 1 tablet (64 mg total) by mouth daily. 30 tablet 1   Multiple Vitamins-Minerals (  ONE-A-DAY WOMENS 50 PLUS PO) Take 1 tablet by mouth daily.      nystatin (MYCOSTATIN/NYSTOP) powder Apply 1 Application topically 3 (three) times daily. For fungal skin infection 2-3 weeks or until resolved 30 g 2   potassium chloride SA (KLOR-CON M) 20 MEQ tablet TAKE 1 TABLET BY MOUTH DAILY 90 tablet 1   simvastatin (ZOCOR) 40 MG tablet Take 1 tablet (40 mg total) by mouth at bedtime. 100 tablet 3   triamcinolone cream (KENALOG) 0.5 % Apply 1 Application topically 2 (two) times daily. To affected areas, for up to 2 weeks. 30 g 2   triamterene-hydrochlorothiazide (DYAZIDE) 37.5-25 MG capsule Take 1 each (1 capsule total) by mouth daily. 100 capsule 3   zinc gluconate 50 MG tablet Take 50 mg by mouth daily.     benzonatate (TESSALON) 200 MG capsule Take 1 capsule (200 mg total) by mouth 3 (three) times daily as needed for cough. (Patient not taking: Reported on 10/10/2022) 30 capsule 0   clindamycin (CLINDAGEL) 1 % gel Apply topically 2 (two) times daily. 30 g 5   No current facility-administered medications for this visit.   Facility-Administered Medications Ordered in  Other Visits  Medication Dose Route Frequency Provider Last Rate Last Admin   heparin lock flush 100 unit/mL  500 Units Intracatheter Once PRN Jacqueline Server, MD         PHYSICAL EXAMINATION: ECOG PERFORMANCE STATUS: 1 - Symptomatic but completely ambulatory  Physical Exam Constitutional:      General: She is not in acute distress. HENT:     Head: Normocephalic and atraumatic.  Eyes:     General: No scleral icterus. Cardiovascular:     Rate and Rhythm: Normal rate.     Heart sounds: Normal heart sounds.  Pulmonary:     Effort: Pulmonary effort is normal. No respiratory distress.     Breath sounds: No wheezing.  Abdominal:     General: Bowel sounds are normal. There is no distension.     Palpations: Abdomen is soft.     Comments: + Colostomy bag   Musculoskeletal:        General: No deformity. Normal range of motion.     Cervical back: Normal range of motion and neck supple.  Skin:    General: Skin is warm and dry.     Findings: Rash present. No erythema.  Neurological:     Mental Status: She is alert and oriented to person, place, and time. Mental status is at baseline.     Cranial Nerves: No cranial nerve deficit.     Coordination: Coordination normal.       LABORATORY DATA:  I have reviewed the data as listed    Latest Ref Rng & Units 12/19/2022    8:53 AM 12/05/2022    8:07 AM 11/21/2022    8:05 AM  CBC  WBC 4.0 - 10.5 K/uL 7.6  6.7  8.1   Hemoglobin 12.0 - 15.0 g/dL 11.0  11.5  11.6   Hematocrit 36.0 - 46.0 % 35.2  36.6  36.4   Platelets 150 - 400 K/uL 216  227  230       Latest Ref Rng & Units 12/19/2022    8:53 AM 12/05/2022    8:07 AM 11/21/2022    8:05 AM  CMP  Glucose 70 - 99 mg/dL 100  99  100   BUN 8 - 23 mg/dL '18  12  14   '$ Creatinine 0.44 - 1.00 mg/dL 0.73  0.75  0.71   Sodium 135 - 145 mmol/L 138  140  140   Potassium 3.5 - 5.1 mmol/L 3.7  3.7  3.7   Chloride 98 - 111 mmol/L 103  104  103   CO2 22 - 32 mmol/L '27  27  27   '$ Calcium 8.9 - 10.3 mg/dL 8.8   8.7  8.8   Total Protein 6.5 - 8.1 g/dL 6.6  6.4  6.5   Total Bilirubin 0.3 - 1.2 mg/dL 0.4  0.5  0.4   Alkaline Phos 38 - 126 U/L 83  116  76   AST 15 - 41 U/L 30  37  30   ALT 0 - 44 U/L 23  34  19      RADIOGRAPHIC STUDIES: I have personally reviewed the radiological images as listed and agreed with the findings in the report. No results found.    No results found.

## 2022-12-19 NOTE — Assessment & Plan Note (Signed)
patient will receive long-acting GCSF prophylaxis on D3

## 2022-12-19 NOTE — Assessment & Plan Note (Signed)
Recommend lomotil  QID PRN as instructed.

## 2022-12-19 NOTE — Assessment & Plan Note (Signed)
Hb is stable. observation

## 2022-12-19 NOTE — Assessment & Plan Note (Addendum)
History of stage IIIC Rectal cancer, s/p TNT, followed by 09/17/19 APR/posterior vaginectomy/TAH/BSO/VY-flap, pT4b pN0 with close vaginal margin 0.2 mm.  Uterus and ovaries negative for malignancy. palliative radiation to vaginal recurrence- 01/19/21 recurrence with lung metastasis.-Palliative -FOLFIRI plus bevacizumab.  Irinotecan was dropped in November 2022 due to side effects. Negative for UGT1A1*28 - radiographically stable, rise of CEA-July 2023 CT lung metastasis worse--> Dec 2023 PET showed progression in pelvic lymph nodes and bone lesions,--> 2nd line irinotecan +panitumumab--> Labs are reviewed and discussed with patient, CEA is trending down.  Proceed with  irinotecan +panitumumab Day 3 GCSF due to neutropenia Repeat CT scan in March 2024

## 2022-12-20 ENCOUNTER — Inpatient Hospital Stay: Payer: 59

## 2022-12-21 ENCOUNTER — Inpatient Hospital Stay: Payer: 59

## 2022-12-21 ENCOUNTER — Encounter: Payer: Self-pay | Admitting: Oncology

## 2022-12-21 VITALS — BP 143/84 | HR 56 | Temp 97.4°F | Resp 20

## 2022-12-21 DIAGNOSIS — K521 Toxic gastroenteritis and colitis: Secondary | ICD-10-CM | POA: Diagnosis not present

## 2022-12-21 DIAGNOSIS — Z86711 Personal history of pulmonary embolism: Secondary | ICD-10-CM | POA: Diagnosis not present

## 2022-12-21 DIAGNOSIS — I10 Essential (primary) hypertension: Secondary | ICD-10-CM | POA: Diagnosis not present

## 2022-12-21 DIAGNOSIS — R634 Abnormal weight loss: Secondary | ICD-10-CM | POA: Diagnosis not present

## 2022-12-21 DIAGNOSIS — C20 Malignant neoplasm of rectum: Secondary | ICD-10-CM

## 2022-12-21 DIAGNOSIS — Z5111 Encounter for antineoplastic chemotherapy: Secondary | ICD-10-CM | POA: Diagnosis not present

## 2022-12-21 DIAGNOSIS — I2510 Atherosclerotic heart disease of native coronary artery without angina pectoris: Secondary | ICD-10-CM | POA: Diagnosis not present

## 2022-12-21 DIAGNOSIS — K5900 Constipation, unspecified: Secondary | ICD-10-CM | POA: Diagnosis not present

## 2022-12-21 DIAGNOSIS — Z800 Family history of malignant neoplasm of digestive organs: Secondary | ICD-10-CM | POA: Diagnosis not present

## 2022-12-21 DIAGNOSIS — Z79899 Other long term (current) drug therapy: Secondary | ICD-10-CM | POA: Diagnosis not present

## 2022-12-21 DIAGNOSIS — T451X5A Adverse effect of antineoplastic and immunosuppressive drugs, initial encounter: Secondary | ICD-10-CM | POA: Diagnosis not present

## 2022-12-21 DIAGNOSIS — K219 Gastro-esophageal reflux disease without esophagitis: Secondary | ICD-10-CM | POA: Diagnosis not present

## 2022-12-21 DIAGNOSIS — K435 Parastomal hernia without obstruction or  gangrene: Secondary | ICD-10-CM | POA: Diagnosis not present

## 2022-12-21 DIAGNOSIS — E7800 Pure hypercholesterolemia, unspecified: Secondary | ICD-10-CM | POA: Diagnosis not present

## 2022-12-21 DIAGNOSIS — D701 Agranulocytosis secondary to cancer chemotherapy: Secondary | ICD-10-CM | POA: Diagnosis not present

## 2022-12-21 DIAGNOSIS — C799 Secondary malignant neoplasm of unspecified site: Secondary | ICD-10-CM

## 2022-12-21 DIAGNOSIS — E876 Hypokalemia: Secondary | ICD-10-CM | POA: Diagnosis not present

## 2022-12-21 LAB — CEA: CEA: 491 ng/mL — ABNORMAL HIGH (ref 0.0–4.7)

## 2022-12-21 MED ORDER — HEPARIN SOD (PORK) LOCK FLUSH 100 UNIT/ML IV SOLN
500.0000 [IU] | Freq: Once | INTRAVENOUS | Status: AC
  Administered 2022-12-21: 500 [IU] via INTRAVENOUS
  Filled 2022-12-21: qty 5

## 2022-12-21 MED ORDER — SODIUM CHLORIDE 0.9 % IV SOLN
INTRAVENOUS | Status: DC
  Filled 2022-12-21: qty 250

## 2022-12-21 MED ORDER — SODIUM CHLORIDE 0.9% FLUSH
10.0000 mL | Freq: Once | INTRAVENOUS | Status: AC
  Administered 2022-12-21: 10 mL via INTRAVENOUS
  Filled 2022-12-21: qty 10

## 2022-12-21 MED ORDER — PEGFILGRASTIM INJECTION 6 MG/0.6ML ~~LOC~~
6.0000 mg | PREFILLED_SYRINGE | Freq: Once | SUBCUTANEOUS | Status: AC
  Administered 2022-12-21: 6 mg via SUBCUTANEOUS
  Filled 2022-12-21: qty 0.6

## 2022-12-21 MED ORDER — MAGNESIUM SULFATE 2 GM/50ML IV SOLN
2.0000 g | Freq: Once | INTRAVENOUS | Status: AC
  Administered 2022-12-21: 2 g via INTRAVENOUS
  Filled 2022-12-21: qty 50

## 2022-12-25 ENCOUNTER — Ambulatory Visit
Admission: RE | Admit: 2022-12-25 | Discharge: 2022-12-25 | Disposition: A | Discharge status: Home / Self Care | Payer: 59 | Source: Ambulatory Visit | Attending: Oncology | Admitting: Oncology

## 2022-12-25 DIAGNOSIS — N281 Cyst of kidney, acquired: Secondary | ICD-10-CM | POA: Diagnosis not present

## 2022-12-25 DIAGNOSIS — R911 Solitary pulmonary nodule: Secondary | ICD-10-CM | POA: Diagnosis not present

## 2022-12-25 DIAGNOSIS — C801 Malignant (primary) neoplasm, unspecified: Secondary | ICD-10-CM | POA: Diagnosis not present

## 2022-12-25 DIAGNOSIS — C20 Malignant neoplasm of rectum: Secondary | ICD-10-CM | POA: Insufficient documentation

## 2022-12-25 DIAGNOSIS — C785 Secondary malignant neoplasm of large intestine and rectum: Secondary | ICD-10-CM | POA: Diagnosis not present

## 2022-12-25 MED ORDER — IOHEXOL 300 MG/ML  SOLN
100.0000 mL | Freq: Once | INTRAMUSCULAR | Status: AC | PRN
  Administered 2022-12-25: 100 mL via INTRAVENOUS

## 2023-01-01 MED FILL — Dexamethasone Sodium Phosphate Inj 100 MG/10ML: INTRAMUSCULAR | Qty: 1 | Status: AC

## 2023-01-02 ENCOUNTER — Encounter: Payer: Self-pay | Admitting: Oncology

## 2023-01-02 ENCOUNTER — Inpatient Hospital Stay: Payer: 59

## 2023-01-02 ENCOUNTER — Inpatient Hospital Stay (HOSPITAL_BASED_OUTPATIENT_CLINIC_OR_DEPARTMENT_OTHER): Payer: 59 | Admitting: Oncology

## 2023-01-02 VITALS — BP 143/62 | HR 88 | Temp 96.4°F | Resp 18 | Wt 153.9 lb

## 2023-01-02 DIAGNOSIS — T451X5A Adverse effect of antineoplastic and immunosuppressive drugs, initial encounter: Secondary | ICD-10-CM | POA: Diagnosis not present

## 2023-01-02 DIAGNOSIS — C799 Secondary malignant neoplasm of unspecified site: Secondary | ICD-10-CM

## 2023-01-02 DIAGNOSIS — I10 Essential (primary) hypertension: Secondary | ICD-10-CM | POA: Diagnosis not present

## 2023-01-02 DIAGNOSIS — Z933 Colostomy status: Secondary | ICD-10-CM | POA: Diagnosis not present

## 2023-01-02 DIAGNOSIS — K5900 Constipation, unspecified: Secondary | ICD-10-CM | POA: Diagnosis not present

## 2023-01-02 DIAGNOSIS — Z5111 Encounter for antineoplastic chemotherapy: Secondary | ICD-10-CM | POA: Diagnosis not present

## 2023-01-02 DIAGNOSIS — E876 Hypokalemia: Secondary | ICD-10-CM | POA: Diagnosis not present

## 2023-01-02 DIAGNOSIS — D701 Agranulocytosis secondary to cancer chemotherapy: Secondary | ICD-10-CM | POA: Diagnosis not present

## 2023-01-02 DIAGNOSIS — K219 Gastro-esophageal reflux disease without esophagitis: Secondary | ICD-10-CM | POA: Diagnosis not present

## 2023-01-02 DIAGNOSIS — C20 Malignant neoplasm of rectum: Secondary | ICD-10-CM

## 2023-01-02 DIAGNOSIS — Z800 Family history of malignant neoplasm of digestive organs: Secondary | ICD-10-CM | POA: Diagnosis not present

## 2023-01-02 DIAGNOSIS — I2510 Atherosclerotic heart disease of native coronary artery without angina pectoris: Secondary | ICD-10-CM | POA: Diagnosis not present

## 2023-01-02 DIAGNOSIS — D6481 Anemia due to antineoplastic chemotherapy: Secondary | ICD-10-CM | POA: Diagnosis not present

## 2023-01-02 DIAGNOSIS — K521 Toxic gastroenteritis and colitis: Secondary | ICD-10-CM | POA: Diagnosis not present

## 2023-01-02 DIAGNOSIS — Z86711 Personal history of pulmonary embolism: Secondary | ICD-10-CM | POA: Diagnosis not present

## 2023-01-02 DIAGNOSIS — R634 Abnormal weight loss: Secondary | ICD-10-CM | POA: Diagnosis not present

## 2023-01-02 DIAGNOSIS — E7800 Pure hypercholesterolemia, unspecified: Secondary | ICD-10-CM | POA: Diagnosis not present

## 2023-01-02 DIAGNOSIS — Z79899 Other long term (current) drug therapy: Secondary | ICD-10-CM | POA: Diagnosis not present

## 2023-01-02 DIAGNOSIS — K435 Parastomal hernia without obstruction or  gangrene: Secondary | ICD-10-CM | POA: Diagnosis not present

## 2023-01-02 LAB — COMPREHENSIVE METABOLIC PANEL
ALT: 23 U/L (ref 0–44)
AST: 32 U/L (ref 15–41)
Albumin: 3.8 g/dL (ref 3.5–5.0)
Alkaline Phosphatase: 80 U/L (ref 38–126)
Anion gap: 8 (ref 5–15)
BUN: 16 mg/dL (ref 8–23)
CO2: 27 mmol/L (ref 22–32)
Calcium: 8.9 mg/dL (ref 8.9–10.3)
Chloride: 104 mmol/L (ref 98–111)
Creatinine, Ser: 0.74 mg/dL (ref 0.44–1.00)
GFR, Estimated: >60 mL/min (ref >60)
Glucose, Bld: 97 mg/dL (ref 70–99)
Potassium: 3.7 mmol/L (ref 3.5–5.1)
Sodium: 139 mmol/L (ref 135–145)
Total Bilirubin: 0.6 mg/dL (ref 0.3–1.2)
Total Protein: 6.7 g/dL (ref 6.5–8.1)

## 2023-01-02 LAB — CBC WITH DIFFERENTIAL/PLATELET
Abs Immature Granulocytes: 0.03 10*3/uL (ref 0.00–0.07)
Basophils Absolute: 0 10*3/uL (ref 0.0–0.1)
Basophils Relative: 0 %
Eosinophils Absolute: 0.2 10*3/uL (ref 0.0–0.5)
Eosinophils Relative: 3 %
HCT: 36.5 % (ref 36.0–46.0)
Hemoglobin: 11.5 g/dL — ABNORMAL LOW (ref 12.0–15.0)
Immature Granulocytes: 1 %
Lymphocytes Relative: 23 %
Lymphs Abs: 1.3 10*3/uL (ref 0.7–4.0)
MCH: 30 pg (ref 26.0–34.0)
MCHC: 31.5 g/dL (ref 30.0–36.0)
MCV: 95.3 fL (ref 80.0–100.0)
Monocytes Absolute: 0.5 10*3/uL (ref 0.1–1.0)
Monocytes Relative: 9 %
Neutro Abs: 3.7 10*3/uL (ref 1.7–7.7)
Neutrophils Relative %: 64 %
Platelets: 224 10*3/uL (ref 150–400)
RBC: 3.83 MIL/uL — ABNORMAL LOW (ref 3.87–5.11)
RDW: 16.9 % — ABNORMAL HIGH (ref 11.5–15.5)
WBC: 5.7 10*3/uL (ref 4.0–10.5)
nRBC: 0 % (ref 0.0–0.2)

## 2023-01-02 LAB — MAGNESIUM: Magnesium: 1.4 mg/dL — ABNORMAL LOW (ref 1.7–2.4)

## 2023-01-02 MED ORDER — HEPARIN SOD (PORK) LOCK FLUSH 100 UNIT/ML IV SOLN
500.0000 [IU] | Freq: Once | INTRAVENOUS | Status: AC | PRN
  Administered 2023-01-02: 500 [IU]
  Filled 2023-01-02: qty 5

## 2023-01-02 MED ORDER — PALONOSETRON HCL INJECTION 0.25 MG/5ML
0.2500 mg | Freq: Once | INTRAVENOUS | Status: AC
  Administered 2023-01-02: 0.25 mg via INTRAVENOUS
  Filled 2023-01-02: qty 5

## 2023-01-02 MED ORDER — DIPHENHYDRAMINE HCL 50 MG/ML IJ SOLN
25.0000 mg | Freq: Once | INTRAMUSCULAR | Status: AC
  Administered 2023-01-02: 25 mg via INTRAVENOUS
  Filled 2023-01-02: qty 1

## 2023-01-02 MED ORDER — ATROPINE SULFATE 1 MG/ML IV SOLN
0.5000 mg | Freq: Once | INTRAVENOUS | Status: AC
  Administered 2023-01-02: 0.5 mg via INTRAVENOUS
  Filled 2023-01-02: qty 1

## 2023-01-02 MED ORDER — SODIUM CHLORIDE 0.9 % IV SOLN
Freq: Once | INTRAVENOUS | Status: AC
  Filled 2023-01-02: qty 250

## 2023-01-02 MED ORDER — MAGNESIUM SULFATE 2 GM/50ML IV SOLN
2.0000 g | Freq: Once | INTRAVENOUS | Status: AC
  Administered 2023-01-02: 2 g via INTRAVENOUS
  Filled 2023-01-02: qty 50

## 2023-01-02 MED ORDER — SODIUM CHLORIDE 0.9 % IV SOLN
10.0000 mg | Freq: Once | INTRAVENOUS | Status: AC
  Administered 2023-01-02: 10 mg via INTRAVENOUS
  Filled 2023-01-02: qty 10

## 2023-01-02 MED ORDER — SODIUM CHLORIDE 0.9% FLUSH
10.0000 mL | INTRAVENOUS | Status: DC | PRN
  Administered 2023-01-02: 10 mL via INTRAVENOUS
  Filled 2023-01-02: qty 10

## 2023-01-02 MED ORDER — SODIUM CHLORIDE 0.9 % IV SOLN
6.0000 mg/kg | Freq: Once | INTRAVENOUS | Status: AC
  Administered 2023-01-02: 400 mg via INTRAVENOUS
  Filled 2023-01-02: qty 20

## 2023-01-02 MED ORDER — SODIUM CHLORIDE 0.9 % IV SOLN
100.0000 mg/m2 | Freq: Once | INTRAVENOUS | Status: AC
  Administered 2023-01-02: 180 mg via INTRAVENOUS
  Filled 2023-01-02: qty 5

## 2023-01-02 NOTE — Assessment & Plan Note (Signed)
patient will receive long-acting GCSF prophylaxis on D3 

## 2023-01-02 NOTE — Progress Notes (Signed)
Hematology/Oncology Progress note [phone] [phone]      CHIEF COMPLAINTS/REASON FOR VISIT:  Follow up for rectal cancer  ASSESSMENT & PLAN:   Cancer Staging  Rectal cancer [place]) Staging form: Colon and Rectum, AJCC 8th Edition - Pathologic stage from 10/06/2019: Stage IIC (ypT4b, pN0, cM0) - Signed by Earlie Server, MD on 10/06/2019 - Clinical stage from 06/30/2020: [name] - Signed by Earlie Server, MD on 04/17/2022 - Pathologic: Stage Unknown (rpTX, pNX, cM1) - Signed by Earlie Server, MD on 01/31/2021   Rectal cancer [facility]) History of stage IIIC Rectal cancer, s/p TNT, followed by 09/17/19 APR/posterior vaginectomy/TAH/BSO/VY-flap, pT4b pN0 with close vaginal margin 0.2 mm.  Uterus and ovaries negative for malignancy. palliative radiation to vaginal recurrence- 01/19/21 recurrence with lung metastasis.-Palliative -FOLFIRI plus bevacizumab.  Irinotecan was dropped in November 2022 due to side effects. Negative for UGT1A1*28 - radiographically stable, rise of CEA-July 2023 CT lung metastasis worse--> Dec 2023 PET showed progression in pelvic lymph nodes and bone lesions,--> 2nd line irinotecan +panitumumab--> Labs are reviewed and discussed with patient, CEA is trending down.  Proceed with  irinotecan +panitumumab Day 3 GCSF due to neutropenia CT scan in March 2024 showed stable disease.   Anemia due to antineoplastic chemotherapy Hb is stable. observation  Chemotherapy induced diarrhea Stable, continue lomotil  QID PRN as instructed.   Chemotherapy induced neutropenia (HCC) patient will receive long-acting GCSF prophylaxis on D3  Encounter for antineoplastic chemotherapy Chemotherapy plan as listed above.   History of pulmonary embolism Continue  Eliquis 2.5 mg twice daily for anticoagulation prophylaxis.  Hypokalemia  potassium is stable.  Continue potassium chloride 20 mEq  daily.   Hypomagnesemia IV Mag 2g along with her chemotherapy today.  Continue  slow mag orally daily.    Follow up 2 weeks Lab MD irinotecan + panitumumab All questions were answered. The patient knows to call the clinic with any problems, questions or concerns.  Earlie Server, MD, PhD [facility] Hematology Oncology 01/02/2023      HISTORY OF PRESENTING ILLNESS:  Patient initially presented with complaints of postmenopausal bleeding on 08/16/2018.  History of was menopausal vaginal bleeding in 2016 which resulted in cervical polypectomy.  Pathology 02/04/2015 showed cervical polyp, consistent with benign endometrial polyp.  [name] after polypectomy due to anxiety associated with pelvic exams.  pelvic exam on 08/16/2018 reviewed cervical abnormality and from enlarged uterus. Seen by Dr. Marcelline Mates on 10/29/2018.  Endometrial biopsy and a Pap smear was performed. 10/29/2018 Pap smear showed adenocarcinoma, favor endometrial origin. 10/29/2018 endometrial biopsy showed endometrioid carcinoma, FIGO grade 1.  10/29/2018- TA & TV Ultrasound revealed: Anteverted uterus measuring 8.7 x 5.6 x 6.4 cm without evidence of focal masses.  The endometrium measuring 24.1 mm (thickened) and heterogeneous.  Right and left ovaries not visualized.  No adnexal masses identified.  No free fluid in cul-de-sac.  Patient was seen by Dr. Theora Gianotti in clinic on 11/13/2018.  Cervical exam reveals 2 cm exophytic irregular mass consistent with malignancy.   11/19/2018 CT chest abdomen pelvis with contrast showed thickened endometrium with some irregularity compatible with the provided diagnosis of endometrial malignancy.  There is a mildly prominent left inguinal node 1.4 cm.  Patient was seen by Dr. Fransisca Connors on 11/20/2018 and left groin lymph node biopsy was recommended.  11/26/2018 patient underwent left inguinal lymph node biopsy. Pathology showed metastatic adenocarcinoma consistent with colorectal origin.  CDX 2+.  Case was discussed on tumor board.  Recommend colonoscopy for further  evaluation.  Patient reports significant weight loss 30 pounds over the last year.  Chronic vaginal spotting. Change of bowel habits the past few months.  More constipated.  Family history positive for brother who has colon cancer prostate cancer.  patient has underwent colonoscopy on 12/03/2018 which reviewed a nonobstructing large mass in the rectum.  Also chronic fistula.  Mass was not circumferential.  This was biopsied with a cold forceps for histology.  Pathology came back hyperplastic polyp negative for dysplasia and malignancy. [name], patient underwent flex sigmoidoscopy on 12/06/2018 with rebiopsy of the rectal mass. This time biopsy results came back positive for invasive colorectal adenocarcinoma, moderately differentiated. Immunotherapy for nearly mismatch repair protein (MMR ) was performed.  There is no loss of MMR expression.  low probability of MSI high.   # Seen by [name] for evaluation of resectability for rectal cancer. In addition, she also had a second opinion with [facility] where her endometrial biopsy pathology was changed to  adenocarcinoma, consistent with colorectal primary.   [name]. She has home health that has been assisting with ostomy care  Patient was also evaluated by [facility] oncology.  Recommendation is to proceed with TNT with concurrent chemoradiation followed by neoadjuvant chemotherapy followed by surgical resection. Patient prefers to have treatment done locally with [facility].   # Oncology Treatment:  02/03/2019- 03/19/2019  concurrent Xeloda and radiation.  Xeloda dose 825mg  /m2 BID - rounded to 1650mg  BID- on days of radiation. 04/09/2019, started on FOLFOX with bolus early.  Omitted.  07/16/2019 finished 8 cycles of FOLFOX. 09/17/19 APR/posterior vaginectomy/TAH/BSO/VY-flap pT4b pN0 with close vaginal margin 0.2 mm.  Uterus and ovaries negative for malignancy. Patient reports bilateral  lower extremity numbness and tingling, intermittent, left worse than right. She has lost a lot of weight since her APR surgery.   #Family history with half brother having's history of colon cancer prostate cancer.  Personal history of colorectal cancer.  Patient has not decided if she wants genetic testing.    # history of PE( 01/13/2020)  in the bilateral lower extremity DVT (01/13/2020).   She finishes 6 months of anticoagulation with Eliquis 5 mg twice daily. Now switched to Eliquis 2.5 mg twice daily..  # She has now developed recurrent disease. #06/30/20  vaginal introitus mass biopsied. Pathology is consistent with metastatic colorectal adenocarcinoma I have discussed with [name]  Dr. Hester Mates and the mass is not resectable. Patient has also had colonoscopy by Dr. Vicente Males yesterday. Normal examination. # 07/16/2020 cycle 1 FOLFIRI  # 07/20/2020 PET scan was done for further evaluation, images are consistent with local recurrence, no distant metastasis. #Discussed with radiation oncology Dr. Baruch Gouty will recommends concurrent chemotherapy and radiation. 08/02/2020-08/16/2020, patient starts radiation.  Xeloda was held due to neutropenia 08/17/2020,-09/06/2020 Xeloda 1500 mg twice daily concurrently with radiation  01/31/21 started on FOLFIRI + Bev 05/18/2021 CT chest abdomen pelvis showed Previously noted enlargement of bilateral inguinal lymph nodes is resolved, consistent with treatment response of nodal metastatic disease. Interval decrease in size of multiple small bilateral pulmonary nodules, consistent with treatment response of pulmonary metastatic disease. No evidence of new metastatic disease. 05/24/2021 - 08/30/2021, continued on FOLFIRI plus bevacizumab.  Irinotecan dose was reduced, eventually 100mg /m2  09/02/2021, CT chest abdomen pelvis without contrast Showed small bilateral pulmonary nodules, unchanged.  Stable metastatic disease.  No noncontrast evidence of new metastatic disease  in the chest abdomen pelvis.  Small parastomal hernia.  Enlargement  of main pulmonary artery.  Coronary artery disease.  09/13/2021, maintenance 5-FU/bevacizumab 11/28/2021, 5-FU/Irinotecan/bevacizumab.  Irinotecan 100 mg/m2 was added back due to progressively increasing CEA.    INTERVAL HISTORY Jacqueline BABBIT is a 77 y.o. female who has above history reviewed by me presents for follow-up of rectal cancer. On Irinotecan and panitumumab. she tolerates well.  + some days she has increased  ostomy output, she takes lomotil PRN, symptoms are controlled.  Denies any nausea vomiting abdominal pain   acne rash on her face has resolved.  She has stable weight.  + Skin yeast infection below bilateral breast and groin area, improved.     Review of Systems  Constitutional:  Positive for fatigue. Negative for appetite change, chills, fever and unexpected weight change.  HENT:   Negative for hearing loss and voice change.   Eyes:  Negative for eye problems.  Respiratory:  Negative for chest tightness and cough.   Cardiovascular:  Negative for chest pain.  Gastrointestinal:  Negative for abdominal distention, abdominal pain, blood in stool, constipation, diarrhea and nausea.  Endocrine: Negative for hot flashes.  Genitourinary:  Negative for difficulty urinating and frequency.   Musculoskeletal:  Positive for arthralgias.  Skin:  Negative for itching and rash.  Neurological:  Negative for extremity weakness and numbness.  Hematological:  Negative for adenopathy.  Psychiatric/Behavioral:  Negative for confusion.     MEDICAL HISTORY:  Past Medical History:  Diagnosis Date   Allergy    Arthritis    Blood clot in vein    Family history of colon cancer    GERD (gastroesophageal reflux disease)    Hypercholesteremia    Hypertension    Hypertension    Lower extremity edema    Personal history of chemotherapy    Rectal cancer ([place]) 12/2018   Urinary incontinence     SURGICAL  HISTORY: Past Surgical History:  Procedure Laterality Date   ABDOMINAL HYSTERECTOMY     CHOLECYSTECTOMY  1971   COLONOSCOPY WITH PROPOFOL N/A 12/03/2018   Procedure: COLONOSCOPY WITH PROPOFOL;  Surgeon: Lucilla Lame, MD;  Location: [facility] ENDOSCOPY;  Service: Endoscopy;  Laterality: N/A;   COLONOSCOPY WITH PROPOFOL N/A 07/15/2020   Procedure: COLONOSCOPY WITH PROPOFOL;  Surgeon: [name], MD;  Location: [facility] ENDOSCOPY;  Service: Gastroenterology;  Laterality: N/A;   FLEXIBLE SIGMOIDOSCOPY N/A 12/06/2018   Procedure: FLEXIBLE SIGMOIDOSCOPY;  Surgeon: [name], MD;  Location: [facility] ENDOSCOPY;  Service: Endoscopy;  Laterality: N/A;   LAPAROSCOPIC COLOSTOMY  01/06/2019   PORTACATH PLACEMENT N/A 04/03/2019   Procedure: INSERTION PORT-A-CATH;  Surgeon: Jules Husbands, MD;  Location: [facility] ORS;  Service: General;  Laterality: N/A;    SOCIAL HISTORY: Social History   Socioeconomic History   Marital status: Widowed    Spouse name: Not on file   Number of children: Not on file   Years of education: Not on file   Highest education level: Not on file  Occupational History   Not on file  Tobacco Use   Smoking status: Former    Types: Cigarettes    Quit date: 12/02/1977    Years since quitting: 45.1   Smokeless tobacco: Former  Scientific laboratory technician Use: Never used  Substance and Sexual Activity   Alcohol use: Never   Drug use: Never   Sexual activity: Not Currently    Birth control/protection: None  Other Topics Concern   Not on file  Social History Narrative   Lives with daughter   Social Determinants of Health  Financial Resource Strain: Medium Risk (06/30/2022)   Overall Financial Resource Strain (CARDIA)    Difficulty of Paying Living Expenses: Somewhat hard  Food Insecurity: No Food Insecurity (06/30/2022)   Hunger Vital Sign    Worried About Running Out of Food in the Last Year: Never true    [place] in the Last Year: Never true  Transportation Needs: No  Transportation Needs (06/30/2022)   PRAPARE - Hydrologist (Medical): No    Lack of Transportation (Non-Medical): No  Physical Activity: Inactive (06/30/2022)   Exercise Vital Sign    Days of Exercise per Week: 0 days    Minutes of Exercise per Session: 0 min  Stress: No Stress Concern Present (06/30/2022)   [place]    Feeling of Stress : Not at all  Social Connections: Moderately Isolated (06/30/2022)   Social Connection and Isolation Panel [NHANES]    Frequency of Communication with Friends and Family: More than three times a week    Frequency of Social Gatherings with Friends and Family: Twice a week    Attends Religious Services: More than 4 times per year    Active Member of [employer] or Organizations: No    Attends Archivist Meetings: Never    Marital Status: Widowed  Intimate Partner Violence: Not At Risk (06/30/2022)   Humiliation, Afraid, Rape, and Kick questionnaire    Fear of Current or Ex-Partner: No    Emotionally Abused: No    Physically Abused: No    Sexually Abused: No    FAMILY HISTORY: Family History  Problem Relation Age of Onset   Colon cancer Brother 81       exposure to chemicals [place]   Hypertension Mother    Stroke Mother    Kidney failure Father    Breast cancer Neg Hx    Ovarian cancer Neg Hx     ALLERGIES:  is allergic to sulfamethoxazole-trimethoprim.  MEDICATIONS:  Current Outpatient Medications  Medication Sig Dispense Refill   Cholecalciferol (VITAMIN D3) 2000 units capsule Take 2,000 Units by mouth daily.     cholestyramine (QUESTRAN) 4 g packet Take 1 packet (4 g total) by mouth 3 (three) times daily. 90 each 1   clindamycin (CLINDAGEL) 1 % gel Apply topically 2 (two) times daily. 30 g 5   diclofenac sodium (VOLTAREN) 1 % GEL Apply 2 g topically 4 (four) times daily as needed (joint pain).  11   diphenoxylate-atropine (LOMOTIL) 2.5-0.025  MG tablet Take 1 tablet by mouth 4 (four) times daily as needed for diarrhea or loose stools. 60 tablet 1   ELIQUIS 2.5 MG TABS tablet TAKE 1 TABLET BY MOUTH TWICE  DAILY 200 tablet 2   fluticasone (FLONASE) 50 MCG/ACT nasal spray USE 1 SPRAY IN EACH NOSTRIL ONCE DAILY 16 g 3   gabapentin (NEURONTIN) 100 MG capsule Take 1 capsule (100 mg total) by mouth at bedtime. 30 capsule 0   ketoconazole (NIZORAL) 2 % cream Apply 1 Application topically daily. For up to 2 weeks. May repeat if need. 30 g 2   lidocaine-prilocaine (EMLA) cream Apply 1 application. topically as needed. 30 g 6   loperamide (IMODIUM) 2 MG capsule Take 1 capsule (2 mg total) by mouth See admin instructions. With onset of loose stool, take 4mg  followed by 2mg  every 2 hours,  Maximum: 16 mg/day 120 capsule 1   loratadine (CLARITIN) 10 MG tablet Take 10 mg by  mouth daily.     magnesium chloride (SLOW-MAG) 64 MG TBEC SR tablet Take 1 tablet (64 mg total) by mouth daily. 30 tablet 1   Multiple Vitamins-Minerals (ONE-A-DAY WOMENS 50 PLUS PO) Take 1 tablet by mouth daily.      nystatin (MYCOSTATIN/NYSTOP) powder Apply 1 Application topically 3 (three) times daily. For fungal skin infection 2-3 weeks or until resolved 30 g 2   potassium chloride SA (KLOR-CON M) 20 MEQ tablet TAKE 1 TABLET BY MOUTH DAILY 90 tablet 1   simvastatin (ZOCOR) 40 MG tablet Take 1 tablet (40 mg total) by mouth at bedtime. 100 tablet 3   triamcinolone cream (KENALOG) 0.5 % Apply 1 Application topically 2 (two) times daily. To affected areas, for up to 2 weeks. 30 g 2   triamterene-hydrochlorothiazide (DYAZIDE) 37.5-25 MG capsule Take 1 each (1 capsule total) by mouth daily. 100 capsule 3   zinc gluconate 50 MG tablet Take 50 mg by mouth daily.     No current facility-administered medications for this visit.   Facility-Administered Medications Ordered in Other Visits  Medication Dose Route Frequency Provider Last Rate Last Admin   atropine injection 0.5 mg  0.5 mg  Intravenous Once Earlie Server, MD       heparin lock flush 100 unit/mL  500 Units Intracatheter Once PRN Earlie Server, MD       irinotecan (CAMPTOSAR) 180 mg in sodium chloride 0.9 % 500 mL chemo infusion  100 mg/m2 (Treatment Plan Recorded) Intravenous Once Earlie Server, MD       panitumumab (VECTIBIX) 400 mg in sodium chloride 0.9 % 100 mL chemo infusion  6 mg/kg (Treatment Plan Recorded) Intravenous Once Earlie Server, MD 240 mL/hr at 01/02/23 1051 400 mg at 01/02/23 1051     PHYSICAL EXAMINATION: ECOG PERFORMANCE STATUS: 1 - Symptomatic but completely ambulatory  Physical Exam Constitutional:      General: She is not in acute distress. HENT:     Head: Normocephalic and atraumatic.  Eyes:     General: No scleral icterus. Cardiovascular:     Rate and Rhythm: Normal rate.     Heart sounds: Normal heart sounds.  Pulmonary:     Effort: Pulmonary effort is normal. No respiratory distress.     Breath sounds: No wheezing.  Abdominal:     General: Bowel sounds are normal. There is no distension.     Palpations: Abdomen is soft.     Comments: + Colostomy bag   Musculoskeletal:        General: No deformity. Normal range of motion.     Cervical back: Normal range of motion and neck supple.  Skin:    General: Skin is warm and dry.     Findings: Rash present. No erythema.  Neurological:     Mental Status: She is alert and oriented to person, place, and time. Mental status is at baseline.     Cranial Nerves: No cranial nerve deficit.     Coordination: Coordination normal.       LABORATORY DATA:  I have reviewed the data as listed    Latest Ref Rng & Units 01/02/2023    8:42 AM 12/19/2022    8:53 AM 12/05/2022    8:07 AM  CBC  WBC 4.0 - 10.5 K/uL 5.7  7.6  6.7   Hemoglobin 12.0 - 15.0 g/dL 11.5  11.0  11.5   Hematocrit 36.0 - 46.0 % 36.5  35.2  36.6   Platelets 150 - 400 K/uL 224  216  227       Latest Ref Rng & Units 01/02/2023    8:42 AM 12/19/2022    8:53 AM 12/05/2022    8:07 AM  CMP   Glucose 70 - 99 mg/dL 97  100  99   BUN 8 - 23 mg/dL 16  18  12    Creatinine 0.44 - 1.00 mg/dL 0.74  0.73  0.75   Sodium 135 - 145 mmol/L 139  138  140   Potassium 3.5 - 5.1 mmol/L 3.7  3.7  3.7   Chloride 98 - 111 mmol/L 104  103  104   CO2 22 - 32 mmol/L 27  27  27    Calcium 8.9 - 10.3 mg/dL 8.9  8.8  8.7   Total Protein 6.5 - 8.1 g/dL 6.7  6.6  6.4   Total Bilirubin 0.3 - 1.2 mg/dL 0.6  0.4  0.5   Alkaline Phos 38 - 126 U/L 80  83  116   AST 15 - 41 U/L 32  30  37   ALT 0 - 44 U/L 23  23  34      RADIOGRAPHIC STUDIES: I have personally reviewed the radiological images as listed and agreed with the findings in the report. CT CHEST ABDOMEN PELVIS W CONTRAST  Result Date: 12/26/2022 CLINICAL DATA:  Metastatic rectal adenocarcinoma; * Tracking Code: BO * EXAM: CT CHEST, ABDOMEN, AND PELVIS WITH CONTRAST TECHNIQUE: Multidetector CT imaging of the chest, abdomen and pelvis was performed following the standard protocol during bolus administration of intravenous contrast. RADIATION DOSE REDUCTION: This exam was performed according to the departmental dose-optimization program which includes automated exposure control, adjustment of the mA and/or kV according to patient size and/or use of iterative reconstruction technique. CONTRAST:  166mL OMNIPAQUE IOHEXOL 300 MG/ML  SOLN COMPARISON:  PET-CT dated September 19, 2022 FINDINGS: CT CHEST FINDINGS Cardiovascular: Normal heart size. No pericardial effusion. Normal caliber thoracic aorta mild atherosclerotic disease. Right chest wall port with tip in the right atrium. Mild coronary artery calcifications. Mediastinum/Nodes: Esophagus and thyroid are unremarkable. No pathologically enlarged lymph nodes seen in the chest. Lungs/Pleura: Central airways are patent. Scattered small solid pulmonary nodules stable. Reference irregular solid pulmonary nodule of the right middle lobe measuring 10 x 7 mm on series 4, image 102, unchanged when compared with the prior  exam and remeasured in similar plane. Musculoskeletal: Osseous lesion of the proximal right humerus demonstrates increased sclerosis when compared with the prior PET-CT possibly due to treatment response. CT ABDOMEN PELVIS FINDINGS Hepatobiliary: No suspicious liver lesion. Mild intra and extrahepatic biliary ductal dilation, unchanged when compared with the prior exams. Pancreas: Unremarkable. No pancreatic ductal dilatation or surrounding inflammatory changes. Spleen: Normal in size without focal abnormality. Adrenals/Urinary Tract: Bilateral adrenal glands are unremarkable. No hydronephrosis or nephrolithiasis. Bilateral simple appearing renal cysts, no specific follow-up imaging is recommended for these lesions. No suspicious renal lesions. Stomach/Bowel: Postsurgical changes of prior APR with left lower quadrant colostomy. Normal appearance of the stomach. Normal appendix. No evidence of obstruction. Vascular/Lymphatic: Aortic atherosclerosis. Stable size of right inguinal and right pelvic lymph nodes. Reference right inguinal lymph node measuring 11 mm on series 2, image 105. Reference right inguinal lymph node measuring 6 mm on series 2, image 98, not significantly changed in size. Reproductive: No adnexal mass. Other: No abdominopelvic ascites. Musculoskeletal: Osseous irregularity of the left iliac bone, unchanged when compared with prior exam. IMPRESSION: 1. Stable scattered solid pulmonary nodules. 2. Right inguinal and right  pelvic lymph nodes which were hypermetabolic on prior PET-CT are stable. 3. Osseous lesion of the proximal right humerus demonstrates increased sclerosis when compared with the prior PET-CT, possibly due to treatment response. 4. 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 5. Aortic Atherosclerosis (ICD10-I70.0). Electronically Signed   By: Yetta Glassman M.D.   On: 12/26/2022 09:05      CT CHEST ABDOMEN PELVIS W CONTRAST  Result Date: 12/26/2022 CLINICAL DATA:   Metastatic rectal adenocarcinoma; * Tracking Code: BO * EXAM: CT CHEST, ABDOMEN, AND PELVIS WITH CONTRAST TECHNIQUE: Multidetector CT imaging of the chest, abdomen and pelvis was performed following the standard protocol during bolus administration of intravenous contrast. RADIATION DOSE REDUCTION: This exam was performed according to the departmental dose-optimization program which includes automated exposure control, adjustment of the mA and/or kV according to patient size and/or use of iterative reconstruction technique. CONTRAST:  168mL OMNIPAQUE IOHEXOL 300 MG/ML  SOLN COMPARISON:  PET-CT dated September 19, 2022 FINDINGS: CT CHEST FINDINGS Cardiovascular: Normal heart size. No pericardial effusion. Normal caliber thoracic aorta mild atherosclerotic disease. Right chest wall port with tip in the right atrium. Mild coronary artery calcifications. Mediastinum/Nodes: Esophagus and thyroid are unremarkable. No pathologically enlarged lymph nodes seen in the chest. Lungs/Pleura: Central airways are patent. Scattered small solid pulmonary nodules stable. Reference irregular solid pulmonary nodule of the right middle lobe measuring 10 x 7 mm on series 4, image 102, unchanged when compared with the prior exam and remeasured in similar plane. Musculoskeletal: Osseous lesion of the proximal right humerus demonstrates increased sclerosis when compared with the prior PET-CT possibly due to treatment response. CT ABDOMEN PELVIS FINDINGS Hepatobiliary: No suspicious liver lesion. Mild intra and extrahepatic biliary ductal dilation, unchanged when compared with the prior exams. Pancreas: Unremarkable. No pancreatic ductal dilatation or surrounding inflammatory changes. Spleen: Normal in size without focal abnormality. Adrenals/Urinary Tract: Bilateral adrenal glands are unremarkable. No hydronephrosis or nephrolithiasis. Bilateral simple appearing renal cysts, no specific follow-up imaging is recommended for these lesions. No  suspicious renal lesions. Stomach/Bowel: Postsurgical changes of prior APR with left lower quadrant colostomy. Normal appearance of the stomach. Normal appendix. No evidence of obstruction. Vascular/Lymphatic: Aortic atherosclerosis. Stable size of right inguinal and right pelvic lymph nodes. Reference right inguinal lymph node measuring 11 mm on series 2, image 105. Reference right inguinal lymph node measuring 6 mm on series 2, image 98, not significantly changed in size. Reproductive: No adnexal mass. Other: No abdominopelvic ascites. Musculoskeletal: Osseous irregularity of the left iliac bone, unchanged when compared with prior exam. IMPRESSION: 1. Stable scattered solid pulmonary nodules. 2. Right inguinal and right pelvic lymph nodes which were hypermetabolic on prior PET-CT are stable. 3. Osseous lesion of the proximal right humerus demonstrates increased sclerosis when compared with the prior PET-CT, possibly due to treatment response. 4. 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 5. Aortic Atherosclerosis (ICD10-I70.0). Electronically Signed   By: Yetta Glassman M.D.   On: 12/26/2022 09:05

## 2023-01-02 NOTE — Assessment & Plan Note (Signed)
potassium is stable.  Continue potassium chloride 20 mEq  daily.  

## 2023-01-02 NOTE — Assessment & Plan Note (Signed)
Hb is stable. observation 

## 2023-01-02 NOTE — Patient Instructions (Signed)
[facility]  Discharge Instructions: Thank you for choosing [facility] to provide your oncology and hematology care.  If you have a lab appointment with the [facility], please go directly to the [facility] and check in at the registration area.  Wear comfortable clothing and clothing appropriate for easy access to any Portacath or PICC line.   We strive to give you quality time with your provider. You may need to reschedule your appointment if you arrive late (15 or more minutes).  Arriving late affects you and other patients whose appointments are after yours.  Also, if you miss three or more appointments without notifying the office, you may be dismissed from the clinic at the provider's discretion.      For prescription refill requests, have your pharmacy contact our office and allow 72 hours for refills to be completed.    Today you received the following chemotherapy and/or immunotherapy agents MAGNESIUM, VECTIBEX, IRINOTECAN      To help prevent nausea and vomiting after your treatment, we encourage you to take your nausea medication as directed.  BELOW ARE SYMPTOMS THAT SHOULD BE REPORTED IMMEDIATELY: *FEVER GREATER THAN 100.4 F (38 C) OR HIGHER *CHILLS OR SWEATING *NAUSEA AND VOMITING THAT IS NOT CONTROLLED WITH YOUR NAUSEA MEDICATION *UNUSUAL SHORTNESS OF BREATH *UNUSUAL BRUISING OR BLEEDING *URINARY PROBLEMS (pain or burning when urinating, or frequent urination) *BOWEL PROBLEMS (unusual diarrhea, constipation, pain near the anus) TENDERNESS IN MOUTH AND THROAT WITH OR WITHOUT PRESENCE OF ULCERS (sore throat, sores in mouth, or a toothache) UNUSUAL RASH, SWELLING OR PAIN  UNUSUAL VAGINAL DISCHARGE OR ITCHING   Items with * indicate a potential emergency and should be followed up as soon as possible or go to the Emergency Department if any problems should occur.  Please show the CHEMOTHERAPY ALERT CARD or IMMUNOTHERAPY  ALERT CARD at check-in to the Emergency Department and triage nurse.  Should you have questions after your visit or need to cancel or reschedule your appointment, please contact [facility]  [phone] and follow the prompts.  Office hours are 8:00 a.m. to 4:30 p.m. Monday - Friday. Please note that voicemails left after 4:00 p.m. may not be returned until the following business day.  We are closed weekends and major holidays. You have access to a nurse at all times for urgent questions. Please call the main number to the clinic [phone] and follow the prompts.  For any non-urgent questions, you may also contact your provider using MyChart. We now offer e-Visits for anyone 47 and older to request care online for non-urgent symptoms. For details visit [email or web address].   Also download the MyChart app! Go to the app store, search "MyChart", open the app, select [facility], and log in with your MyChart username and password.  Magnesium Sulfate Injection What is this medication? MAGNESIUM SULFATE (mag NEE zee um SUL fate) prevents and treats low levels of magnesium in your body. It may also be used to prevent and treat seizures during pregnancy in people with high blood pressure disorders, such as preeclampsia or eclampsia. Magnesium plays an important role in maintaining the health of your muscles and nervous system. This medicine may be used for other purposes; ask your health care provider or pharmacist if you have questions. What should I tell my care team before I take this medication? They need to know if you have any of these conditions: Heart disease History of irregular heart  beat Kidney disease An unusual or allergic reaction to magnesium sulfate, medications, foods, dyes, or preservatives Pregnant or trying to get pregnant Breast-feeding How should I use this medication? This medication is for infusion into a vein. It is given in a hospital  or clinic setting. Talk to your care team about the use of this medication in children. While this medication may be prescribed for selected conditions, precautions do apply. Overdosage: If you think you have taken too much of this medicine contact a poison control center or emergency room at once. NOTE: This medicine is only for you. Do not share this medicine with others. What if I miss a dose? This does not apply. What may interact with this medication? Certain medications for anxiety or sleep Certain medications for seizures, such phenobarbital Digoxin Medications that relax muscles for surgery Narcotic medications for pain This list may not describe all possible interactions. Give your health care provider a list of all the medicines, herbs, non-prescription drugs, or dietary supplements you use. Also tell them if you smoke, drink alcohol, or use illegal drugs. Some items may interact with your medicine. What should I watch for while using this medication? Your condition will be monitored carefully while you are receiving this medication. You may need blood work done while you are receiving this medication. What side effects may I notice from receiving this medication? Side effects that you should report to your care team as soon as possible: Allergic reactions--skin rash, itching, hives, swelling of the face, lips, tongue, or throat High magnesium level--confusion, drowsiness, facial flushing, redness, sweating, muscle weakness, fast or irregular heartbeat, trouble breathing Low blood pressure--dizziness, feeling faint or lightheaded, blurry vision Side effects that usually do not require medical attention (report to your care team if they continue or are bothersome): Headache Nausea This list may not describe all possible side effects. Call your doctor for medical advice about side effects. You may report side effects to FDA at 1-800-FDA-1088. Where should I keep my medication? This  medication is given in a hospital or clinic and will not be stored at home. NOTE: This sheet is a summary. It may not cover all possible information. If you have questions about this medicine, talk to your doctor, pharmacist, or health care provider.  2023 Elsevier/Gold Standard (2013-02-07 00:00:00)  Panitumumab Injection What is this medication? PANITUMUMAB (pan i TOOM ue mab) treats colorectal cancer. It works by blocking a protein that causes cancer cells to grow and multiply. This helps to slow or stop the spread of cancer cells. It is a monoclonal antibody. This medicine may be used for other purposes; ask your health care provider or pharmacist if you have questions. COMMON BRAND NAME(S): Vectibix What should I tell my care team before I take this medication? They need to know if you have any of these conditions: Eye disease Low levels of magnesium in the blood Lung disease An unusual or allergic reaction to panitumumab, other medications, foods, dyes, or preservatives Pregnant or trying to get pregnant Breast-feeding How should I use this medication? This medication is injected into a vein. It is given by your care team in a hospital or clinic setting. Talk to your care team about the use of this medication in children. Special care may be needed. Overdosage: If you think you have taken too much of this medicine contact a poison control center or emergency room at once. NOTE: This medicine is only for you. Do not share this medicine with others. What if  I miss a dose? Keep appointments for follow-up doses. It is important not to miss your dose. Call your care team if you are unable to keep an appointment. What may interact with this medication? Bevacizumab This list may not describe all possible interactions. Give your health care provider a list of all the medicines, herbs, non-prescription drugs, or dietary supplements you use. Also tell them if you smoke, drink alcohol, or use  illegal drugs. Some items may interact with your medicine. What should I watch for while using this medication? Your condition will be monitored carefully while you are receiving this medication. This medication may make you feel generally unwell. This is not uncommon as chemotherapy can affect healthy cells as well as cancer cells. Report any side effects. Continue your course of treatment even though you feel ill unless your care team tells you to stop. This medication can make you more sensitive to the sun. Keep out of the sun while receiving this medication and for 2 months after stopping therapy. If you cannot avoid being in the sun, wear protective clothing and sunscreen. Do not use sun lamps, tanning beds, or tanning booths. Check with your care team if you have severe diarrhea, nausea, and vomiting or if you sweat a lot. The loss of too much body fluid may make it dangerous for you to take this medication. This medication may cause serious skin reactions. They can happen weeks to months after starting the medication. Contact your care team right away if you notice fevers or flu-like symptoms with a rash. The rash may be red or purple and then turn into blisters or peeling of the skin. You may also notice a red rash with swelling of the face, lips, or lymph nodes in your neck or under your arms. Talk to your care team if you may be pregnant. Serious birth defects can occur if you take this medication during pregnancy and for 2 months after the last dose. Contraception is recommended while taking this medication and for 2 months after the last dose. Your care team can help you find the option that works for you. Do not breastfeed while taking this medication and for 2 months after the last dose. This medication may cause infertility. Talk to your care team if you are concerned about your fertility. What side effects may I notice from receiving this medication? Side effects that you should report to  your care team as soon as possible: Allergic reactions--skin rash, itching, hives, swelling of the face, lips, tongue, or throat Dry cough, shortness of breath or trouble breathing Eye pain, redness, irritation, or discharge with blurry or decreased vision Infusion reactions--chest pain, shortness of breath or trouble breathing, feeling faint or lightheaded Low magnesium level--muscle pain or cramps, unusual weakness or fatigue, fast or irregular heartbeat, tremors Low potassium level--muscle pain or cramps, unusual weakness or fatigue, fast or irregular heartbeat, constipation Redness, blistering, peeling, or loosening of the skin, including inside the mouth Skin reactions on sun-exposed areas Side effects that usually do not require medical attention (report to your care team if they continue or are bothersome): Change in nail shape, thickness, or color Diarrhea Dry skin Fatigue Nausea Vomiting This list may not describe all possible side effects. Call your doctor for medical advice about side effects. You may report side effects to FDA at 1-800-FDA-1088. Where should I keep my medication? This medication is given in a hospital or clinic. It will not be stored at home. NOTE: This sheet is  a summary. It may not cover all possible information. If you have questions about this medicine, talk to your doctor, pharmacist, or health care provider.  2023 Elsevier/Gold Standard (2022-02-13 00:00:00)  Irinotecan Injection What is this medication? IRINOTECAN (ir in oh TEE kan) treats some types of cancer. It works by slowing down the growth of cancer cells. This medicine may be used for other purposes; ask your health care provider or pharmacist if you have questions. COMMON BRAND NAME(S): Camptosar What should I tell my care team before I take this medication? They need to know if you have any of these conditions: Dehydration Diarrhea Infection, especially a viral infection, such as  chickenpox, cold sores, herpes Liver disease Low blood cell levels (white cells, red cells, and platelets) Low levels of electrolytes, such as calcium, magnesium, or potassium in your blood Recent or ongoing radiation An unusual or allergic reaction to irinotecan, other medications, foods, dyes, or preservatives If you or your partner are pregnant or trying to get pregnant Breast-feeding How should I use this medication? This medication is injected into a vein. It is given by your care team in a hospital or clinic setting. Talk to your care team about the use of this medication in children. Special care may be needed. Overdosage: If you think you have taken too much of this medicine contact a poison control center or emergency room at once. NOTE: This medicine is only for you. Do not share this medicine with others. What if I miss a dose? Keep appointments for follow-up doses. It is important not to miss your dose. Call your care team if you are unable to keep an appointment. What may interact with this medication? Do not take this medication with any of the following: Cobicistat Itraconazole This medication may also interact with the following: Certain antibiotics, such as clarithromycin, rifampin, rifabutin Certain antivirals for HIV or AIDS Certain medications for fungal infections, such as ketoconazole, posaconazole, voriconazole Certain medications for seizures, such as carbamazepine, phenobarbital, phenytoin Gemfibrozil Nefazodone St. John's wort This list may not describe all possible interactions. Give your health care provider a list of all the medicines, herbs, non-prescription drugs, or dietary supplements you use. Also tell them if you smoke, drink alcohol, or use illegal drugs. Some items may interact with your medicine. What should I watch for while using this medication? Your condition will be monitored carefully while you are receiving this medication. You may need blood  work while taking this medication. This medication may make you feel generally unwell. This is not uncommon as chemotherapy can affect healthy cells as well as cancer cells. Report any side effects. Continue your course of treatment even though you feel ill unless your care team tells you to stop. This medication can cause serious side effects. To reduce the risk, your care team may give you other medications to take before receiving this one. Be sure to follow the directions from your care team. This medication may affect your coordination, reaction time, or judgement. Do not drive or operate machinery until you know how this medication affects you. Sit up or stand slowly to reduce the risk of dizzy or fainting spells. Drinking alcohol with this medication can increase the risk of these side effects. This medication may increase your risk of getting an infection. Call your care team for advice if you get a fever, chills, sore throat, or other symptoms of a cold or flu. Do not treat yourself. Try to avoid being around people who are  sick. Avoid taking medications that contain aspirin, acetaminophen, ibuprofen, naproxen, or ketoprofen unless instructed by your care team. These medications may hide a fever. This medication may increase your risk to bruise or bleed. Call your care team if you notice any unusual bleeding. Be careful brushing or flossing your teeth or using a toothpick because you may get an infection or bleed more easily. If you have any dental work done, tell your dentist you are receiving this medication. Talk to your care team if you or your partner are pregnant or think either of you might be pregnant. This medication can cause serious birth defects if taken during pregnancy and for 6 months after the last dose. You will need a negative pregnancy test before starting this medication. Contraception is recommended while taking this medication and for 6 months after the last dose. Your care  team can help you find the option that works for you. Do not father a child while taking this medication and for 3 months after the last dose. Use a condom for contraception during this time period. Do not breastfeed while taking this medication and for 7 days after the last dose. This medication may cause infertility. Talk to your care team if you are concerned about your fertility. What side effects may I notice from receiving this medication? Side effects that you should report to your care team as soon as possible: Allergic reactions--skin rash, itching, hives, swelling of the face, lips, tongue, or throat Dry cough, shortness of breath or trouble breathing Increased saliva or tears, increased sweating, stomach cramping, diarrhea, small pupils, unusual weakness or fatigue, slow heartbeat Infection--fever, chills, cough, sore throat, wounds that don't heal, pain or trouble when passing urine, general feeling of discomfort or being unwell Kidney injury--decrease in the amount of urine, swelling of the ankles, hands, or feet Low red blood cell level--unusual weakness or fatigue, dizziness, headache, trouble breathing Severe or prolonged diarrhea Unusual bruising or bleeding Side effects that usually do not require medical attention (report to your care team if they continue or are bothersome): Constipation Diarrhea Hair loss Loss of appetite Nausea Stomach pain This list may not describe all possible side effects. Call your doctor for medical advice about side effects. You may report side effects to FDA at 1-800-FDA-1088. Where should I keep my medication? This medication is given in a hospital or clinic. It will not be stored at home. NOTE: This sheet is a summary. It may not cover all possible information. If you have questions about this medicine, talk to your doctor, pharmacist, or health care provider.  2023 Elsevier/Gold Standard (2022-02-09 00:00:00)

## 2023-01-02 NOTE — Assessment & Plan Note (Addendum)
History of stage IIIC Rectal cancer, s/p TNT, followed by 09/17/19 APR/posterior vaginectomy/TAH/BSO/VY-flap, pT4b pN0 with close vaginal margin 0.2 mm.  Uterus and ovaries negative for malignancy. palliative radiation to vaginal recurrence- 01/19/21 recurrence with lung metastasis.-Palliative -FOLFIRI plus bevacizumab.  Irinotecan was dropped in November 2022 due to side effects. Negative for UGT1A1*28 - radiographically stable, rise of CEA-July 2023 CT lung metastasis worse--> Dec 2023 PET showed progression in pelvic lymph nodes and bone lesions,--> 2nd line irinotecan +panitumumab--> Labs are reviewed and discussed with patient, CEA is trending down.  Proceed with  irinotecan +panitumumab Day 3 GCSF due to neutropenia CT scan in March 2024 showed stable disease.

## 2023-01-02 NOTE — Assessment & Plan Note (Signed)
Chemotherapy plan as listed above 

## 2023-01-02 NOTE — Assessment & Plan Note (Signed)
Stable, continue lomotil  QID PRN as instructed.

## 2023-01-02 NOTE — Assessment & Plan Note (Signed)
Continue  Eliquis 2.5 mg twice daily for anticoagulation prophylaxis. 

## 2023-01-02 NOTE — Assessment & Plan Note (Signed)
IV Mag 2g along with her chemotherapy today.  Continue slow mag orally daily.

## 2023-01-03 ENCOUNTER — Other Ambulatory Visit: Payer: Self-pay | Admitting: Family Medicine

## 2023-01-03 ENCOUNTER — Encounter: Payer: Self-pay | Admitting: Family Medicine

## 2023-01-03 ENCOUNTER — Ambulatory Visit (INDEPENDENT_AMBULATORY_CARE_PROVIDER_SITE_OTHER): Payer: 59 | Admitting: Family Medicine

## 2023-01-03 VITALS — BP 126/72 | HR 68 | Ht 64.0 in | Wt 154.6 lb

## 2023-01-03 DIAGNOSIS — C799 Secondary malignant neoplasm of unspecified site: Secondary | ICD-10-CM

## 2023-01-03 DIAGNOSIS — D689 Coagulation defect, unspecified: Secondary | ICD-10-CM | POA: Diagnosis not present

## 2023-01-03 DIAGNOSIS — R7309 Other abnormal glucose: Secondary | ICD-10-CM

## 2023-01-03 DIAGNOSIS — I129 Hypertensive chronic kidney disease with stage 1 through stage 4 chronic kidney disease, or unspecified chronic kidney disease: Secondary | ICD-10-CM | POA: Diagnosis not present

## 2023-01-03 DIAGNOSIS — E782 Mixed hyperlipidemia: Secondary | ICD-10-CM | POA: Diagnosis not present

## 2023-01-03 DIAGNOSIS — N182 Chronic kidney disease, stage 2 (mild): Secondary | ICD-10-CM

## 2023-01-03 DIAGNOSIS — C778 Secondary and unspecified malignant neoplasm of lymph nodes of multiple regions: Secondary | ICD-10-CM | POA: Diagnosis not present

## 2023-01-03 DIAGNOSIS — I825Z3 Chronic embolism and thrombosis of unspecified deep veins of distal lower extremity, bilateral: Secondary | ICD-10-CM | POA: Diagnosis not present

## 2023-01-03 DIAGNOSIS — Z933 Colostomy status: Secondary | ICD-10-CM | POA: Diagnosis not present

## 2023-01-03 NOTE — Patient Instructions (Addendum)
Thank you for coming to the office today.  Keep up the great work with Dr Herschell Dimes have 2 refills remaining on the Ketoconazole cream, please let me know if running low we can re order.  Pausing vaccines right now. I agree.  For allergies, keep on Claritin and Flonase  We can add Azelastine nasal spray for additional allergy coverage if needed.   DUE for FASTING BLOOD WORK (no food or drink after midnight before the lab appointment, only water or coffee without cream/sugar on the morning of)  SCHEDULE "Lab Only" visit in the morning at the clinic for lab draw in 6 MONTHS   - Make sure Lab Only appointment is at about 1 week before your next appointment, so that results will be available  For Lab Results, once available within 2-3 days of blood draw, you can can log in to MyChart online to view your results and a brief explanation. Also, we can discuss results at next follow-up visit.   Please schedule a Follow-up Appointment to: Return in about 6 months (around 07/06/2023) for 6 month fasting lab only then 1 week later Annual Physical.  If you have any other questions or concerns, please feel free to call the office or send a message through [facility]. You may also schedule an earlier appointment if necessary.  Additionally, you may be receiving a survey about your experience at our office within a few days to 1 week by e-mail or mail. We value your feedback.  Nobie Putnam, DO [facility]

## 2023-01-03 NOTE — Progress Notes (Signed)
Subjective:    Patient ID: Jacqueline Yoder, female    DOB: 1946/05/07, 77 y.o.   MRN: [id number]  Jacqueline Yoder is a 77 y.o. female presenting on 01/03/2023 for Hypertension, Chronic Kidney Disease, and Follow-up   HPI  Oncologist - Dr Earlie Server [facility]) Radiation Oncologist - Dr Noreene Filbert [facility]) GYN - Dani Gobble CNM ([facility])   Metastatic Adenocarcinoma Carcinoma of Colon Spread to Endometrium/Uterine Cancer Initial diagnosis followed since 2019 with postmenopausal vaginal bleeding and spotting, and ultimately led to tissue diagnosis from endometrial biopsy.   S/p12/2/20 APR/posterior vaginectomy / Hysterectomy (total abdominal) /BSO/VY-flap, s/p chemotherapy and radiation therapy   Continues with chemo/immunotherapy infusions with good results. Continues with treatments per Dr Tasia Catchings in April 2024 Last treated yesterday 01/02/23 Recent CT imaging below, good news without new concerns or recurrence Sees Dr Baruch Gouty July 2024  Colostomy bag intact.    Improved symptoms Continues treatment  Chemotherapy induced Diarrhea side effect  Chemotherapy induced neutropenia GCSF prophylaxis  Hypomagnesemia S/p IV mag yesterday    CHRONIC HTN: Hypokalemia BP controlled Last lab 12/2022 yesterday K 3.7 Current Meds - Triamterene-HCTZ 37.5-25mg  daily. She is on Potassium Supplement 54mEq daily Reports good compliance, took meds today. Tolerating well, w/o complaints. Denies CP, dyspnea, HA, edema, dizziness / lightheadedness    Chronic DVT / on Chronic Anticoagulation On Eliquis 2.5mg  daily for anticoagulation   Gluteal Pressure Sore Stage 1 / Superficial - RESOLVED Advised that the pressure sore has resolved. She uses occasional Desitin for barrier support, which has resolved.   Left Hip Pain, Osteoarthritis Chronic problem with multiple joints with osteoarthritis in knees etc, and recently she admits flare up Left hip with some pain bothering her. She  had X-ray done by Oncology Dr Tasia Catchings and it also showed some osteoarthritic changes. She has treated with Tylenol and using cane to assist.   Candidal Intertrigo Reports skin folds has used topical treatment with improvement Taking Nystatin powder previously Now on topical rx Ketoconzale cream with some improvement  Eczema, secondary to chemotherapy Using topical steroid therapy and topical moisturizer   Health Maintenance:  Skipping Shingrix vaccine at this time.     01/03/2023    9:25 AM 06/30/2022    9:45 AM 01/02/2022    9:48 AM  Depression screen PHQ 2/9  Decreased Interest 0 0 0  Down, Depressed, Hopeless 0 1 0  PHQ - 2 Score 0 1 0  Altered sleeping 0 0 0  Tired, decreased energy 0 0 0  Change in appetite 0 0 0  Feeling bad or failure about yourself  0 0 0  Trouble concentrating 0 0 0  Moving slowly or fidgety/restless 0 0 0  Suicidal thoughts 0 0 0  PHQ-9 Score 0 1 0  Difficult doing work/chores Not difficult at all Not difficult at all Not difficult at all    Social History   Tobacco Use   Smoking status: Former    Types: Cigarettes    Quit date: 12/02/1977    Years since quitting: 45.1   Smokeless tobacco: Former  Scientific laboratory technician Use: Never used  Substance Use Topics   Alcohol use: Never   Drug use: Never    Review of Systems Per HPI unless specifically indicated above     Objective:    BP 126/72   Pulse 68   Ht 5\' 4"  (1.626 m)   Wt 154 lb 9.6 oz (70.1 kg)   SpO2 98%  BMI 26.54 kg/m   Wt Readings from Last 3 Encounters:  01/03/23 154 lb 9.6 oz (70.1 kg)  01/02/23 153 lb 14.4 oz (69.8 kg)  12/19/22 154 lb 14.4 oz (70.3 kg)    Physical Exam Vitals and nursing note reviewed.  Constitutional:      General: She is not in acute distress.    Appearance: She is well-developed. She is not diaphoretic.     Comments: Well-appearing, comfortable, cooperative  HENT:     Head: Normocephalic and atraumatic.  Eyes:     General:        Right eye: No  discharge.        Left eye: No discharge.     Conjunctiva/sclera: Conjunctivae normal.  Neck:     Thyroid: No thyromegaly.     Vascular: No carotid bruit.  Cardiovascular:     Rate and Rhythm: Normal rate and regular rhythm.     Heart sounds: Normal heart sounds. No murmur heard. Pulmonary:     Effort: Pulmonary effort is normal. No respiratory distress.     Breath sounds: Wheezing (scattered upper airway sounds) present. No rales.  Abdominal:     Comments: Colostomy bag in place  Musculoskeletal:        General: Normal range of motion.     Cervical back: Normal range of motion and neck supple.     Right lower leg: No edema.     Left lower leg: No edema.  Lymphadenopathy:     Cervical: No cervical adenopathy.  Skin:    General: Skin is warm and dry.     Findings: No erythema or rash.  Neurological:     Mental Status: She is alert and oriented to person, place, and time.  Psychiatric:        Behavior: Behavior normal.     Comments: Well groomed, good eye contact, normal speech and thoughts     I have personally reviewed the radiology report from 12/25/22 on CT Chest Abdomen Pelvis.  CLINICAL DATA:  Metastatic rectal adenocarcinoma; * Tracking Code: BO *   EXAM: CT CHEST, ABDOMEN, AND PELVIS WITH CONTRAST   TECHNIQUE: Multidetector CT imaging of the chest, abdomen and pelvis was performed following the standard protocol during bolus administration of intravenous contrast.   RADIATION DOSE REDUCTION: This exam was performed according to the departmental dose-optimization program which includes automated exposure control, adjustment of the mA and/or kV according to patient size and/or use of iterative reconstruction technique.   CONTRAST:  178mL OMNIPAQUE IOHEXOL 300 MG/ML  SOLN   COMPARISON:  PET-CT dated September 19, 2022   FINDINGS: CT CHEST FINDINGS   Cardiovascular: Normal heart size. No pericardial effusion. Normal caliber thoracic aorta mild atherosclerotic  disease. Right chest wall port with tip in the right atrium. Mild coronary artery calcifications.   Mediastinum/Nodes: Esophagus and thyroid are unremarkable. No pathologically enlarged lymph nodes seen in the chest.   Lungs/Pleura: Central airways are patent. Scattered small solid pulmonary nodules stable. Reference irregular solid pulmonary nodule of the right middle lobe measuring 10 x 7 mm on series 4, image 102, unchanged when compared with the prior exam and remeasured in similar plane.   Musculoskeletal: Osseous lesion of the proximal right humerus demonstrates increased sclerosis when compared with the prior PET-CT possibly due to treatment response.   CT ABDOMEN PELVIS FINDINGS   Hepatobiliary: No suspicious liver lesion. Mild intra and extrahepatic biliary ductal dilation, unchanged when compared with the prior exams.   Pancreas: Unremarkable. No  pancreatic ductal dilatation or surrounding inflammatory changes.   Spleen: Normal in size without focal abnormality.   Adrenals/Urinary Tract: Bilateral adrenal glands are unremarkable. No hydronephrosis or nephrolithiasis. Bilateral simple appearing renal cysts, no specific follow-up imaging is recommended for these lesions. No suspicious renal lesions.   Stomach/Bowel: Postsurgical changes of prior APR with left lower quadrant colostomy. Normal appearance of the stomach. Normal appendix. No evidence of obstruction.   Vascular/Lymphatic: Aortic atherosclerosis. Stable size of right inguinal and right pelvic lymph nodes. Reference right inguinal lymph node measuring 11 mm on series 2, image 105. Reference right inguinal lymph node measuring 6 mm on series 2, image 98, not significantly changed in size.   Reproductive: No adnexal mass.   Other: No abdominopelvic ascites.   Musculoskeletal: Osseous irregularity of the left iliac bone, unchanged when compared with prior exam.   IMPRESSION: 1. Stable scattered solid  pulmonary nodules. 2. Right inguinal and right pelvic lymph nodes which were hypermetabolic on prior PET-CT are stable. 3. Osseous lesion of the proximal right humerus demonstrates increased sclerosis when compared with the prior PET-CT, possibly due to treatment response. 4. 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 5. Aortic Atherosclerosis (ICD10-I70.0).     Electronically Signed   By: Yetta Glassman M.D.   On: 12/26/2022 09:05    Results for orders placed or performed in visit on 01/02/23  Comprehensive metabolic panel  Result Value Ref Range   Sodium 139 135 - 145 mmol/L   Potassium 3.7 3.5 - 5.1 mmol/L   Chloride 104 98 - 111 mmol/L   CO2 27 22 - 32 mmol/L   Glucose, Bld 97 70 - 99 mg/dL   BUN 16 8 - 23 mg/dL   Creatinine, Ser 0.74 0.44 - 1.00 mg/dL   Calcium 8.9 8.9 - 10.3 mg/dL   Total Protein 6.7 6.5 - 8.1 g/dL   Albumin 3.8 3.5 - 5.0 g/dL   AST 32 15 - 41 U/L   ALT 23 0 - 44 U/L   Alkaline Phosphatase 80 38 - 126 U/L   Total Bilirubin 0.6 0.3 - 1.2 mg/dL   GFR, Estimated >60 >60 mL/min   Anion gap 8 5 - 15  Magnesium  Result Value Ref Range   Magnesium 1.4 (L) 1.7 - 2.4 mg/dL  CBC with Differential  Result Value Ref Range   WBC 5.7 4.0 - 10.5 K/uL   RBC 3.83 (L) 3.87 - 5.11 MIL/uL   Hemoglobin 11.5 (L) 12.0 - 15.0 g/dL   HCT 36.5 36.0 - 46.0 %   MCV 95.3 80.0 - 100.0 fL   MCH 30.0 26.0 - 34.0 pg   MCHC 31.5 30.0 - 36.0 g/dL   RDW 16.9 (H) 11.5 - 15.5 %   Platelets 224 150 - 400 K/uL   nRBC 0.0 0.0 - 0.2 %   Neutrophils Relative % 64 %   Neutro Abs 3.7 1.7 - 7.7 K/uL   Lymphocytes Relative 23 %   Lymphs Abs 1.3 0.7 - 4.0 K/uL   Monocytes Relative 9 %   Monocytes Absolute 0.5 0.1 - 1.0 K/uL   Eosinophils Relative 3 %   Eosinophils Absolute 0.2 0.0 - 0.5 K/uL   Basophils Relative 0 %   Basophils Absolute 0.0 0.0 - 0.1 K/uL   Immature Granulocytes 1 %   Abs Immature Granulocytes 0.03 0.00 - 0.07 K/uL      Assessment & Plan:   Problem  List Items Addressed This Visit     Benign  hypertension with CKD (chronic kidney disease), stage II   Chronic deep vein thrombosis (DVT) (HCC)   Coagulation defect (HCC)   Colostomy in place [facility])   Hyperlipidemia   Metastatic adenocarcinoma ([place]) - Primary   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 She admits side effects already on treatment for these and has reviewed with Oncology with eczema and loose stools  Colostomy without complication.  Okay to defer vaccines, until ready.  Candidal Intertrigo Finished Nystatin powder, now improved on Ketoconazole cream has refills  For allergies, keep on Claritin and Flonase We can add Azelastine nasal spray for additional allergy coverage if needed.  Encouraged to stay active  Will check labs for cholesterol sugar upcoming in 6 months.  No orders of the defined types were placed in this encounter.     Follow up plan: Return in about 6 months (around 07/06/2023) for 6 month fasting lab only then 1 week later Annual Physical.  Future labs Lipids thyroid A1c  6 month  Nobie Putnam, DO [facility] 01/03/2023, 9:31 AM

## 2023-01-04 ENCOUNTER — Inpatient Hospital Stay: Payer: 59

## 2023-01-04 DIAGNOSIS — K435 Parastomal hernia without obstruction or  gangrene: Secondary | ICD-10-CM | POA: Diagnosis not present

## 2023-01-04 DIAGNOSIS — K219 Gastro-esophageal reflux disease without esophagitis: Secondary | ICD-10-CM | POA: Diagnosis not present

## 2023-01-04 DIAGNOSIS — C20 Malignant neoplasm of rectum: Secondary | ICD-10-CM | POA: Diagnosis not present

## 2023-01-04 DIAGNOSIS — I10 Essential (primary) hypertension: Secondary | ICD-10-CM | POA: Diagnosis not present

## 2023-01-04 DIAGNOSIS — R634 Abnormal weight loss: Secondary | ICD-10-CM | POA: Diagnosis not present

## 2023-01-04 DIAGNOSIS — C799 Secondary malignant neoplasm of unspecified site: Secondary | ICD-10-CM

## 2023-01-04 DIAGNOSIS — D701 Agranulocytosis secondary to cancer chemotherapy: Secondary | ICD-10-CM | POA: Diagnosis not present

## 2023-01-04 DIAGNOSIS — E7800 Pure hypercholesterolemia, unspecified: Secondary | ICD-10-CM | POA: Diagnosis not present

## 2023-01-04 DIAGNOSIS — Z800 Family history of malignant neoplasm of digestive organs: Secondary | ICD-10-CM | POA: Diagnosis not present

## 2023-01-04 DIAGNOSIS — Z5111 Encounter for antineoplastic chemotherapy: Secondary | ICD-10-CM | POA: Diagnosis not present

## 2023-01-04 DIAGNOSIS — Z79899 Other long term (current) drug therapy: Secondary | ICD-10-CM | POA: Diagnosis not present

## 2023-01-04 DIAGNOSIS — T451X5A Adverse effect of antineoplastic and immunosuppressive drugs, initial encounter: Secondary | ICD-10-CM | POA: Diagnosis not present

## 2023-01-04 DIAGNOSIS — I2510 Atherosclerotic heart disease of native coronary artery without angina pectoris: Secondary | ICD-10-CM | POA: Diagnosis not present

## 2023-01-04 DIAGNOSIS — Z933 Colostomy status: Secondary | ICD-10-CM | POA: Diagnosis not present

## 2023-01-04 DIAGNOSIS — K5900 Constipation, unspecified: Secondary | ICD-10-CM | POA: Diagnosis not present

## 2023-01-04 DIAGNOSIS — E876 Hypokalemia: Secondary | ICD-10-CM | POA: Diagnosis not present

## 2023-01-04 DIAGNOSIS — K521 Toxic gastroenteritis and colitis: Secondary | ICD-10-CM | POA: Diagnosis not present

## 2023-01-04 DIAGNOSIS — Z86711 Personal history of pulmonary embolism: Secondary | ICD-10-CM | POA: Diagnosis not present

## 2023-01-04 LAB — CEA: CEA: 518 ng/mL — ABNORMAL HIGH (ref 0.0–4.7)

## 2023-01-04 MED ORDER — PEGFILGRASTIM INJECTION 6 MG/0.6ML ~~LOC~~
6.0000 mg | PREFILLED_SYRINGE | Freq: Once | SUBCUTANEOUS | Status: AC
  Administered 2023-01-04: 6 mg via SUBCUTANEOUS
  Filled 2023-01-04: qty 0.6

## 2023-01-07 ENCOUNTER — Other Ambulatory Visit: Payer: Self-pay | Admitting: Oncology

## 2023-01-08 NOTE — Telephone Encounter (Signed)
Component Ref Range & Units 6 d ago (01/02/23) 2 wk ago (12/19/22) 1 mo ago (12/05/22) 1 mo ago (11/21/22) 2 mo ago (11/07/22) 2 mo ago (10/24/22) 3 mo ago (10/10/22)  Potassium 3.5 - 5.1 mmol/L 3.7 3.7 3.7 3.7 3.8

## 2023-01-09 ENCOUNTER — Encounter: Payer: Self-pay | Admitting: Oncology

## 2023-01-09 ENCOUNTER — Other Ambulatory Visit: Payer: Self-pay | Admitting: Oncology

## 2023-01-09 MED ORDER — NYSTATIN 100000 UNIT/GM EX POWD: 1.0000 | Freq: Three times a day (TID) | CUTANEOUS | 0 refills | Status: DC

## 2023-01-09 NOTE — Telephone Encounter (Signed)
Please advise 

## 2023-01-10 ENCOUNTER — Other Ambulatory Visit: Payer: Self-pay | Admitting: Oncology

## 2023-01-10 MED ORDER — POTASSIUM CHLORIDE CRYS ER 20 MEQ PO TBCR: 20.0000 meq | EXTENDED_RELEASE_TABLET | Freq: Every day | ORAL | 1 refills | Status: DC

## 2023-01-10 NOTE — Telephone Encounter (Signed)
Component Ref Range & Units 8 d ago (01/02/23) 3 wk ago (12/19/22) 1 mo ago (12/05/22) 1 mo ago (11/21/22) 2 mo ago (11/07/22) 2 mo ago (10/24/22) 3 mo ago (10/10/22)  Potassium 3.5 - 5.1 mmol/L 3.7 3.7 3.7 3.7 3.8

## 2023-01-15 MED FILL — Dexamethasone Sodium Phosphate Inj 100 MG/10ML: INTRAMUSCULAR | Qty: 1 | Status: AC

## 2023-01-16 ENCOUNTER — Inpatient Hospital Stay: Payer: 59

## 2023-01-16 ENCOUNTER — Inpatient Hospital Stay: Payer: 59 | Attending: Oncology

## 2023-01-16 ENCOUNTER — Inpatient Hospital Stay (HOSPITAL_BASED_OUTPATIENT_CLINIC_OR_DEPARTMENT_OTHER): Payer: 59 | Admitting: Oncology

## 2023-01-16 ENCOUNTER — Encounter: Payer: Self-pay | Admitting: Oncology

## 2023-01-16 VITALS — BP 121/63 | HR 67 | Temp 97.0°F | Resp 18 | Wt 158.2 lb

## 2023-01-16 DIAGNOSIS — Z933 Colostomy status: Secondary | ICD-10-CM | POA: Diagnosis not present

## 2023-01-16 DIAGNOSIS — Z86711 Personal history of pulmonary embolism: Secondary | ICD-10-CM | POA: Insufficient documentation

## 2023-01-16 DIAGNOSIS — Z7901 Long term (current) use of anticoagulants: Secondary | ICD-10-CM | POA: Diagnosis not present

## 2023-01-16 DIAGNOSIS — K219 Gastro-esophageal reflux disease without esophagitis: Secondary | ICD-10-CM | POA: Diagnosis not present

## 2023-01-16 DIAGNOSIS — C799 Secondary malignant neoplasm of unspecified site: Secondary | ICD-10-CM

## 2023-01-16 DIAGNOSIS — C20 Malignant neoplasm of rectum: Secondary | ICD-10-CM | POA: Diagnosis not present

## 2023-01-16 DIAGNOSIS — L0390 Cellulitis, unspecified: Secondary | ICD-10-CM | POA: Insufficient documentation

## 2023-01-16 DIAGNOSIS — T451X5A Adverse effect of antineoplastic and immunosuppressive drugs, initial encounter: Secondary | ICD-10-CM

## 2023-01-16 DIAGNOSIS — R609 Edema, unspecified: Secondary | ICD-10-CM | POA: Insufficient documentation

## 2023-01-16 DIAGNOSIS — I10 Essential (primary) hypertension: Secondary | ICD-10-CM | POA: Diagnosis not present

## 2023-01-16 DIAGNOSIS — Z800 Family history of malignant neoplasm of digestive organs: Secondary | ICD-10-CM | POA: Diagnosis not present

## 2023-01-16 DIAGNOSIS — Z5189 Encounter for other specified aftercare: Secondary | ICD-10-CM | POA: Diagnosis not present

## 2023-01-16 DIAGNOSIS — Z86718 Personal history of other venous thrombosis and embolism: Secondary | ICD-10-CM | POA: Insufficient documentation

## 2023-01-16 DIAGNOSIS — E7800 Pure hypercholesterolemia, unspecified: Secondary | ICD-10-CM | POA: Insufficient documentation

## 2023-01-16 DIAGNOSIS — R197 Diarrhea, unspecified: Secondary | ICD-10-CM | POA: Diagnosis not present

## 2023-01-16 DIAGNOSIS — K435 Parastomal hernia without obstruction or  gangrene: Secondary | ICD-10-CM | POA: Diagnosis not present

## 2023-01-16 DIAGNOSIS — E876 Hypokalemia: Secondary | ICD-10-CM

## 2023-01-16 DIAGNOSIS — Z87891 Personal history of nicotine dependence: Secondary | ICD-10-CM | POA: Diagnosis not present

## 2023-01-16 DIAGNOSIS — Z79899 Other long term (current) drug therapy: Secondary | ICD-10-CM | POA: Diagnosis not present

## 2023-01-16 DIAGNOSIS — D6481 Anemia due to antineoplastic chemotherapy: Secondary | ICD-10-CM | POA: Diagnosis not present

## 2023-01-16 DIAGNOSIS — B372 Candidiasis of skin and nail: Secondary | ICD-10-CM | POA: Insufficient documentation

## 2023-01-16 DIAGNOSIS — Z5112 Encounter for antineoplastic immunotherapy: Secondary | ICD-10-CM | POA: Insufficient documentation

## 2023-01-16 DIAGNOSIS — Z5111 Encounter for antineoplastic chemotherapy: Secondary | ICD-10-CM | POA: Diagnosis not present

## 2023-01-16 DIAGNOSIS — K521 Toxic gastroenteritis and colitis: Secondary | ICD-10-CM | POA: Diagnosis not present

## 2023-01-16 DIAGNOSIS — I2510 Atherosclerotic heart disease of native coronary artery without angina pectoris: Secondary | ICD-10-CM | POA: Diagnosis not present

## 2023-01-16 DIAGNOSIS — I700 Atherosclerosis of aorta: Secondary | ICD-10-CM | POA: Insufficient documentation

## 2023-01-16 DIAGNOSIS — R634 Abnormal weight loss: Secondary | ICD-10-CM | POA: Diagnosis not present

## 2023-01-16 LAB — COMPREHENSIVE METABOLIC PANEL
ALT: 28 U/L (ref 0–44)
AST: 38 U/L (ref 15–41)
Albumin: 3.5 g/dL (ref 3.5–5.0)
Alkaline Phosphatase: 82 U/L (ref 38–126)
Anion gap: 8 (ref 5–15)
BUN: 14 mg/dL (ref 8–23)
CO2: 27 mmol/L (ref 22–32)
Calcium: 8.8 mg/dL — ABNORMAL LOW (ref 8.9–10.3)
Chloride: 103 mmol/L (ref 98–111)
Creatinine, Ser: 0.64 mg/dL (ref 0.44–1.00)
GFR, Estimated: >60 mL/min (ref >60)
Glucose, Bld: 97 mg/dL (ref 70–99)
Potassium: 3.6 mmol/L (ref 3.5–5.1)
Sodium: 138 mmol/L (ref 135–145)
Total Bilirubin: 0.5 mg/dL (ref 0.3–1.2)
Total Protein: 6.5 g/dL (ref 6.5–8.1)

## 2023-01-16 LAB — CBC WITH DIFFERENTIAL/PLATELET
Abs Immature Granulocytes: 0.04 10*3/uL (ref 0.00–0.07)
Basophils Absolute: 0 10*3/uL (ref 0.0–0.1)
Basophils Relative: 0 %
Eosinophils Absolute: 0.2 10*3/uL (ref 0.0–0.5)
Eosinophils Relative: 3 %
HCT: 34.7 % — ABNORMAL LOW (ref 36.0–46.0)
Hemoglobin: 11.1 g/dL — ABNORMAL LOW (ref 12.0–15.0)
Immature Granulocytes: 1 %
Lymphocytes Relative: 20 %
Lymphs Abs: 1.4 10*3/uL (ref 0.7–4.0)
MCH: 30.5 pg (ref 26.0–34.0)
MCHC: 32 g/dL (ref 30.0–36.0)
MCV: 95.3 fL (ref 80.0–100.0)
Monocytes Absolute: 0.5 10*3/uL (ref 0.1–1.0)
Monocytes Relative: 8 %
Neutro Abs: 4.6 10*3/uL (ref 1.7–7.7)
Neutrophils Relative %: 68 %
Platelets: 228 10*3/uL (ref 150–400)
RBC: 3.64 MIL/uL — ABNORMAL LOW (ref 3.87–5.11)
RDW: 17.3 % — ABNORMAL HIGH (ref 11.5–15.5)
WBC: 6.8 10*3/uL (ref 4.0–10.5)
nRBC: 0 % (ref 0.0–0.2)

## 2023-01-16 LAB — MAGNESIUM: Magnesium: 1.5 mg/dL — ABNORMAL LOW (ref 1.7–2.4)

## 2023-01-16 MED ORDER — SODIUM CHLORIDE 0.9 % IV SOLN
Freq: Once | INTRAVENOUS | Status: AC
  Filled 2023-01-16: qty 250

## 2023-01-16 MED ORDER — FLUCONAZOLE 100 MG PO TABS: 100.0000 mg | ORAL_TABLET | Freq: Every day | ORAL | 0 refills | Status: DC

## 2023-01-16 MED ORDER — CEPHALEXIN 500 MG PO CAPS: 500.0000 mg | ORAL_CAPSULE | Freq: Three times a day (TID) | ORAL | 0 refills | Status: DC

## 2023-01-16 MED ORDER — MAGNESIUM SULFATE 2 GM/50ML IV SOLN
2.0000 g | Freq: Once | INTRAVENOUS | Status: AC
  Administered 2023-01-16: 2 g via INTRAVENOUS
  Filled 2023-01-16: qty 50

## 2023-01-16 NOTE — Assessment & Plan Note (Signed)
Continue  Eliquis 2.5 mg twice daily for anticoagulation prophylaxis. 

## 2023-01-16 NOTE — Assessment & Plan Note (Signed)
Hb is stable. observation 

## 2023-01-16 NOTE — Assessment & Plan Note (Signed)
potassium is stable.  Continue potassium chloride 20 mEq  daily.  

## 2023-01-16 NOTE — Assessment & Plan Note (Signed)
Skin candidiasis with superimposed bacterial infection.  Recommend continue nystatin powder, recommend diflucan and keflex. Rx sent to pharmacy

## 2023-01-16 NOTE — Progress Notes (Signed)
Pt here for follow up and reports that right knee is giving her problems: pain and stiffness. Pt reports that rash under bresat has not improved much.

## 2023-01-16 NOTE — Assessment & Plan Note (Addendum)
IV Mag 2g PRN Mag <=1.6.  Continue slow mag orally daily.

## 2023-01-16 NOTE — Assessment & Plan Note (Signed)
Chemotherapy plan as listed above 

## 2023-01-16 NOTE — Progress Notes (Signed)
Hematology/Oncology Progress note [phone] [phone]      CHIEF COMPLAINTS/REASON FOR VISIT:  Follow up for rectal cancer  ASSESSMENT & PLAN:   Cancer Staging  Rectal cancer Staging form: Colon and Rectum, AJCC 8th Edition - Pathologic stage from 10/06/2019: Stage IIC (ypT4b, pN0, cM0) - Signed by Jacqueline Server, MD on 10/06/2019 - Clinical stage from 06/30/2020: Jacqueline Yoder - Signed by Jacqueline Server, MD on 04/17/2022 - Pathologic: Stage Unknown (rpTX, pNX, cM1) - Signed by Jacqueline Server, MD on 01/31/2021   Rectal cancer History of stage IIIC Rectal cancer, s/p TNT, followed by 09/17/19 APR/posterior vaginectomy/TAH/BSO/VY-flap, pT4b pN0 with close vaginal margin 0.2 mm.  Uterus and ovaries negative for malignancy. palliative radiation to vaginal recurrence- 01/19/21 recurrence with lung metastasis.-Palliative -FOLFIRI plus bevacizumab.  Irinotecan was dropped in November 2022 due to side effects. Negative for UGT1A1*28 - radiographically stable, rise of CEA-July 2023 CT lung metastasis worse--> Dec 2023 PET showed progression in pelvic lymph nodes and bone lesions,--> 2nd line irinotecan +panitumumab-->CT March 2024 stable.  Labs are reviewed and discussed with patient, CEA is trending down.  Hold chemotherapy due to cellulitis   Encounter for antineoplastic chemotherapy Chemotherapy plan as listed above.   Cellulitis Skin candidiasis with superimposed bacterial infection.  Recommend continue nystatin powder, recommend diflucan and keflex. Rx sent to pharmacy  Anemia due to antineoplastic chemotherapy Hb is stable. observation  History of pulmonary embolism Continue  Eliquis 2.5 mg twice daily for anticoagulation prophylaxis.  Chemotherapy induced diarrhea Stable, continue lomotil  QID PRN as instructed.   Hypokalemia  potassium is stable.  Continue potassium chloride 20 mEq  daily.   Hypomagnesemia IV Mag 2g PRN Mag <=1.6.  Continue slow mag orally daily.     Follow up 2 weeks Lab MD irinotecan + panitumumab All questions were answered. The patient knows to call the clinic with any problems, questions or concerns.  Jacqueline Server, MD, PhD [facility] Hematology Oncology 01/16/2023      HISTORY OF PRESENTING ILLNESS:  Patient initially presented with complaints of postmenopausal bleeding on 08/16/2018.  History of was menopausal vaginal bleeding in 2016 which resulted in cervical polypectomy.  Pathology 02/04/2015 showed cervical polyp, consistent with benign endometrial polyp.  [name] after polypectomy due to anxiety associated with pelvic exams.  pelvic exam on 08/16/2018 reviewed cervical abnormality and from enlarged uterus. Seen by Dr. Marcelline Mates on 10/29/2018.  Endometrial biopsy and a Pap smear was performed. 10/29/2018 Pap smear showed adenocarcinoma, favor endometrial origin. 10/29/2018 endometrial biopsy showed endometrioid carcinoma, FIGO grade 1.  10/29/2018- TA & TV Ultrasound revealed: Anteverted uterus measuring 8.7 x 5.6 x 6.4 cm without evidence of focal masses.  The endometrium measuring 24.1 mm (thickened) and heterogeneous.  Right and left ovaries not visualized.  No adnexal masses identified.  No free fluid in cul-de-sac.  Patient was seen by Dr. Theora Yoder in clinic on 11/13/2018.  Cervical exam reveals 2 cm exophytic irregular mass consistent with malignancy.   11/19/2018 CT chest abdomen pelvis with contrast showed thickened endometrium with some irregularity compatible with the provided diagnosis of endometrial malignancy.  There is a mildly prominent left inguinal node 1.4 cm.  Patient was seen by Dr. Fransisca Connors on 11/20/2018 and left groin lymph node biopsy was recommended.  11/26/2018 patient underwent left inguinal lymph node biopsy. Pathology showed metastatic adenocarcinoma consistent with colorectal origin.  CDX 2+.  Case was discussed on tumor board.  Recommend colonoscopy for further evaluation.  Patient reports  significant weight  loss 30 pounds over the last year.  Chronic vaginal spotting. Change of bowel habits the past few months.  More constipated.  Family history positive for brother who has colon cancer prostate cancer.  patient has underwent colonoscopy on 12/03/2018 which reviewed a nonobstructing large mass in the rectum.  Also chronic fistula.  Mass was not circumferential.  This was biopsied with a cold forceps for histology.  Pathology came back hyperplastic polyp negative for dysplasia and malignancy. [name], patient underwent flex sigmoidoscopy on 12/06/2018 with rebiopsy of the rectal mass. This time biopsy results came back positive for invasive colorectal adenocarcinoma, moderately differentiated. Immunotherapy for nearly mismatch repair protein (MMR ) was performed.  There is no loss of MMR expression.  low probability of MSI high.   # Seen by [name] for evaluation of resectability for rectal cancer. In addition, she also had a second opinion with [facility] where her endometrial biopsy pathology was changed to  adenocarcinoma, consistent with colorectal primary.   [name]. She has home health that has been assisting with ostomy care  Patient was also evaluated by [facility] oncology.  Recommendation is to proceed with TNT with concurrent chemoradiation followed by neoadjuvant chemotherapy followed by surgical resection. Patient prefers to have treatment done locally with [facility].   # Oncology Treatment:  02/03/2019- 03/19/2019  concurrent Xeloda and radiation.  Xeloda dose 825mg  /m2 BID - rounded to 1650mg  BID- on days of radiation. 04/09/2019, started on FOLFOX with bolus early.  Omitted.  07/16/2019 finished 8 cycles of FOLFOX. 09/17/19 APR/posterior vaginectomy/TAH/BSO/VY-flap pT4b pN0 with close vaginal margin 0.2 mm.  Uterus and ovaries negative for malignancy. Patient reports bilateral lower extremity numbness and  tingling, intermittent, left worse than right. She has lost a lot of weight since her APR surgery.   #Family history with half brother having's history of colon cancer prostate cancer.  Personal history of colorectal cancer.  Patient has not decided if she wants genetic testing.    # history of PE( 01/13/2020)  in the bilateral lower extremity DVT (01/13/2020).   She finishes 6 months of anticoagulation with Eliquis 5 mg twice daily. Now switched to Eliquis 2.5 mg twice daily..  # She has now developed recurrent disease. #06/30/20  vaginal introitus mass biopsied. Pathology is consistent with metastatic colorectal adenocarcinoma I have discussed with [name]  Dr. Hester Mates and the mass is not resectable. Patient has also had colonoscopy by Dr. Vicente Males yesterday. Normal examination. # 07/16/2020 cycle 1 FOLFIRI  # 07/20/2020 PET scan was done for further evaluation, images are consistent with local recurrence, no distant metastasis. #Discussed with radiation oncology Dr. Baruch Gouty will recommends concurrent chemotherapy and radiation. 08/02/2020-08/16/2020, patient starts radiation.  Xeloda was held due to neutropenia 08/17/2020,-09/06/2020 Xeloda 1500 mg twice daily concurrently with radiation  01/31/21 started on FOLFIRI + Bev 05/18/2021 CT chest abdomen pelvis showed Previously noted enlargement of bilateral inguinal lymph nodes is resolved, consistent with treatment response of nodal metastatic disease. Interval decrease in size of multiple small bilateral pulmonary nodules, consistent with treatment response of pulmonary metastatic disease. No evidence of new metastatic disease. 05/24/2021 - 08/30/2021, continued on FOLFIRI plus bevacizumab.  Irinotecan dose was reduced, eventually 100mg /m2  09/02/2021, CT chest abdomen pelvis without contrast Showed small bilateral pulmonary nodules, unchanged.  Stable metastatic disease.  No noncontrast evidence of new metastatic disease in the chest abdomen pelvis.   Small parastomal hernia.  Enlargement of main pulmonary artery.  Coronary  artery disease.  09/13/2021, maintenance 5-FU/bevacizumab 11/28/2021, 5-FU/Irinotecan/bevacizumab.  Irinotecan 100 mg/m2 was added back due to progressively increasing CEA.    INTERVAL HISTORY Jacqueline Yoder is a 77 y.o. female who has above history reviewed by me presents for follow-up of rectal cancer. On Irinotecan and panitumumab. she tolerates well.  - Rash under the breast, patient applied nystatin powder, for management. - Recurrent yeast infection under the breast, previously treated with cream and improved but has since worsened.    Review of Systems  Constitutional:  Positive for fatigue. Negative for appetite change, chills, fever and unexpected weight change.  HENT:   Negative for hearing loss and voice change.   Eyes:  Negative for eye problems.  Respiratory:  Negative for chest tightness and cough.   Cardiovascular:  Negative for chest pain.  Gastrointestinal:  Negative for abdominal distention, abdominal pain, blood in stool, constipation, diarrhea and nausea.  Endocrine: Negative for hot flashes.  Genitourinary:  Negative for difficulty urinating and frequency.   Musculoskeletal:  Positive for arthralgias.  Skin:  Positive for rash. Negative for itching.  Neurological:  Negative for extremity weakness and numbness.  Hematological:  Negative for adenopathy.  Psychiatric/Behavioral:  Negative for confusion.     MEDICAL HISTORY:  Past Medical History:  Diagnosis Date   Allergy    Arthritis    Blood clot in vein    Family history of colon cancer    GERD (gastroesophageal reflux disease)    Hypercholesteremia    Hypertension    Hypertension    Lower extremity edema    Personal history of chemotherapy    Rectal cancer 12/2018   Urinary incontinence     SURGICAL HISTORY: Past Surgical History:  Procedure Laterality Date   ABDOMINAL HYSTERECTOMY     CHOLECYSTECTOMY  1971   COLONOSCOPY  WITH PROPOFOL N/A 12/03/2018   Procedure: COLONOSCOPY WITH PROPOFOL;  Surgeon: Lucilla Lame, MD;  Location: [facility] ENDOSCOPY;  Service: Endoscopy;  Laterality: N/A;   COLONOSCOPY WITH PROPOFOL N/A 07/15/2020   Procedure: COLONOSCOPY WITH PROPOFOL;  Surgeon: [name], MD;  Location: [facility] ENDOSCOPY;  Service: Gastroenterology;  Laterality: N/A;   FLEXIBLE SIGMOIDOSCOPY N/A 12/06/2018   Procedure: FLEXIBLE SIGMOIDOSCOPY;  Surgeon: [name], MD;  Location: [facility] ENDOSCOPY;  Service: Endoscopy;  Laterality: N/A;   LAPAROSCOPIC COLOSTOMY  01/06/2019   PORTACATH PLACEMENT N/A 04/03/2019   Procedure: INSERTION PORT-A-CATH;  Surgeon: Jules Husbands, MD;  Location: [facility] ORS;  Service: General;  Laterality: N/A;    SOCIAL HISTORY: Social History   Socioeconomic History   Marital status: Widowed    Spouse name: Not on file   Number of children: Not on file   Years of education: Not on file   Highest education level: Not on file  Occupational History   Not on file  Tobacco Use   Smoking status: Former    Types: Cigarettes    Quit date: 12/02/1977    Years since quitting: 45.1   Smokeless tobacco: Former  Scientific laboratory technician Use: Never used  Substance and Sexual Activity   Alcohol use: Never   Drug use: Never   Sexual activity: Not Currently    Birth control/protection: None  Other Topics Concern   Not on file  Social History Narrative   Lives with daughter   Social Determinants of Health   Financial Resource Strain: Medium Risk (06/30/2022)   Overall Financial Resource Strain (CARDIA)    Difficulty of Paying Living Expenses: Somewhat hard  Food Insecurity:  No Food Insecurity (06/30/2022)   Hunger Vital Sign    Worried About Running Out of Food in the Last Year: Never true    Ran Out of Food in the Last Year: Never true  Transportation Needs: No Transportation Needs (06/30/2022)   PRAPARE - Hydrologist (Medical): No    Lack of Transportation  (Non-Medical): No  Physical Activity: Inactive (06/30/2022)   Exercise Vital Sign    Days of Exercise per Week: 0 days    Minutes of Exercise per Session: 0 min  Stress: No Stress Concern Present (06/30/2022)   [place]    Feeling of Stress : Not at all  Social Connections: Moderately Isolated (06/30/2022)   Social Connection and Isolation Panel [NHANES]    Frequency of Communication with Friends and Family: More than three times a week    Frequency of Social Gatherings with Friends and Family: Twice a week    Attends Religious Services: More than 4 times per year    Active Member of [employer] or Organizations: No    Attends Archivist Meetings: Never    Marital Status: Widowed  Intimate Partner Violence: Not At Risk (06/30/2022)   Humiliation, Afraid, Rape, and Kick questionnaire    Fear of Current or Ex-Partner: No    Emotionally Abused: No    Physically Abused: No    Sexually Abused: No    FAMILY HISTORY: Family History  Problem Relation Age of Onset   Colon cancer Brother 71       exposure to chemicals [place]   Hypertension Mother    Stroke Mother    Kidney failure Father    Breast cancer Neg Hx    Ovarian cancer Neg Hx     ALLERGIES:  is allergic to sulfamethoxazole-trimethoprim.  MEDICATIONS:  Current Outpatient Medications  Medication Sig Dispense Refill   cephALEXin (KEFLEX) 500 MG capsule Take 1 capsule (500 mg total) by mouth 3 (three) times daily. 15 capsule 0   Cholecalciferol (VITAMIN D3) 2000 units capsule Take 2,000 Units by mouth daily.     cholestyramine (QUESTRAN) 4 g packet Take 1 packet (4 g total) by mouth 3 (three) times daily. 90 each 1   clindamycin (CLINDAGEL) 1 % gel Apply topically 2 (two) times daily. 30 g 5   diclofenac sodium (VOLTAREN) 1 % GEL Apply 2 g topically 4 (four) times daily as needed (joint pain).  11   diphenoxylate-atropine (LOMOTIL) 2.5-0.025 MG tablet Take  1 tablet by mouth 4 (four) times daily as needed for diarrhea or loose stools. 60 tablet 1   ELIQUIS 2.5 MG TABS tablet TAKE 1 TABLET BY MOUTH TWICE  DAILY 200 tablet 2   fluconazole (DIFLUCAN) 100 MG tablet Take 1 tablet (100 mg total) by mouth daily. 7 tablet 0   fluticasone (FLONASE) 50 MCG/ACT nasal spray USE 1 SPRAY IN EACH NOSTRIL ONCE DAILY 16 g 3   ketoconazole (NIZORAL) 2 % cream Apply 1 Application topically daily. For up to 2 weeks. May repeat if need. 30 g 2   lidocaine-prilocaine (EMLA) cream Apply 1 application. topically as needed. 30 g 6   loperamide (IMODIUM) 2 MG capsule Take 1 capsule (2 mg total) by mouth See admin instructions. With onset of loose stool, take 4mg  followed by 2mg  every 2 hours,  Maximum: 16 mg/day 120 capsule 1   loratadine (CLARITIN) 10 MG tablet Take 10 mg by mouth daily.  magnesium chloride (SLOW-MAG) 64 MG TBEC SR tablet Take 1 tablet (64 mg total) by mouth daily. 30 tablet 1   Multiple Vitamins-Minerals (ONE-A-DAY WOMENS 50 PLUS PO) Take 1 tablet by mouth daily.      nystatin (MYCOSTATIN/NYSTOP) powder Apply 1 Application topically 3 (three) times daily. 45 g 0   potassium chloride SA (KLOR-CON M) 20 MEQ tablet Take 1 tablet (20 mEq total) by mouth daily. 100 tablet 1   simvastatin (ZOCOR) 40 MG tablet Take 1 tablet (40 mg total) by mouth at bedtime. 100 tablet 3   triamcinolone cream (KENALOG) 0.5 % Apply 1 Application topically 2 (two) times daily. To affected areas, for up to 2 weeks. 30 g 2   triamterene-hydrochlorothiazide (DYAZIDE) 37.5-25 MG capsule Take 1 each (1 capsule total) by mouth daily. 100 capsule 3   zinc gluconate 50 MG tablet Take 50 mg by mouth daily.     No current facility-administered medications for this visit.   Facility-Administered Medications Ordered in Other Visits  Medication Dose Route Frequency Provider Last Rate Last Admin   0.9 %  sodium chloride infusion   Intravenous Once Jacqueline Server, MD       magnesium sulfate IVPB 2  g 50 mL  2 g Intravenous Once Jacqueline Server, MD         PHYSICAL EXAMINATION: ECOG PERFORMANCE STATUS: 1 - Symptomatic but completely ambulatory  Physical Exam Constitutional:      General: She is not in acute distress. HENT:     Head: Normocephalic and atraumatic.  Eyes:     General: No scleral icterus. Cardiovascular:     Rate and Rhythm: Normal rate.     Heart sounds: Normal heart sounds.  Pulmonary:     Effort: Pulmonary effort is normal. No respiratory distress.     Breath sounds: No wheezing.  Abdominal:     General: Bowel sounds are normal. There is no distension.     Palpations: Abdomen is soft.     Comments: + Colostomy bag   Musculoskeletal:        General: No deformity. Normal range of motion.     Cervical back: Normal range of motion and neck supple.  Skin:    General: Skin is warm and dry.     Findings: Rash present. No erythema.     Comments: Erythematous skin changes under breast and bilateral groin area  Neurological:     Mental Status: She is alert and oriented to person, place, and time. Mental status is at baseline.     Cranial Nerves: No cranial nerve deficit.     Coordination: Coordination normal.       LABORATORY DATA:  I have reviewed the data as listed    Latest Ref Rng & Units 01/16/2023    7:55 AM 01/02/2023    8:42 AM 12/19/2022    8:53 AM  CBC  WBC 4.0 - 10.5 K/uL 6.8  5.7  7.6   Hemoglobin 12.0 - 15.0 g/dL 11.1  11.5  11.0   Hematocrit 36.0 - 46.0 % 34.7  36.5  35.2   Platelets 150 - 400 K/uL 228  224  216       Latest Ref Rng & Units 01/16/2023    7:55 AM 01/02/2023    8:42 AM 12/19/2022    8:53 AM  CMP  Glucose 70 - 99 mg/dL 97  97  100   BUN 8 - 23 mg/dL 14  16  18    Creatinine 0.44 -  1.00 mg/dL 0.64  0.74  0.73   Sodium 135 - 145 mmol/L 138  139  138   Potassium 3.5 - 5.1 mmol/L 3.6  3.7  3.7   Chloride 98 - 111 mmol/L 103  104  103   CO2 22 - 32 mmol/L 27  27  27    Calcium 8.9 - 10.3 mg/dL 8.8  8.9  8.8   Total Protein 6.5 - 8.1  g/dL 6.5  6.7  6.6   Total Bilirubin 0.3 - 1.2 mg/dL 0.5  0.6  0.4   Alkaline Phos 38 - 126 U/L 82  80  83   AST 15 - 41 U/L 38  32  30   ALT 0 - 44 U/L 28  23  23       RADIOGRAPHIC STUDIES: I have personally reviewed the radiological images as listed and agreed with the findings in the report. CT CHEST ABDOMEN PELVIS W CONTRAST  Result Date: 12/26/2022 CLINICAL DATA:  Metastatic rectal adenocarcinoma; * Tracking Code: BO * EXAM: CT CHEST, ABDOMEN, AND PELVIS WITH CONTRAST TECHNIQUE: Multidetector CT imaging of the chest, abdomen and pelvis was performed following the standard protocol during bolus administration of intravenous contrast. RADIATION DOSE REDUCTION: This exam was performed according to the departmental dose-optimization program which includes automated exposure control, adjustment of the mA and/or kV according to patient size and/or use of iterative reconstruction technique. CONTRAST:  165mL OMNIPAQUE IOHEXOL 300 MG/ML  SOLN COMPARISON:  PET-CT dated September 19, 2022 FINDINGS: CT CHEST FINDINGS Cardiovascular: Normal heart size. No pericardial effusion. Normal caliber thoracic aorta mild atherosclerotic disease. Right chest wall port with tip in the right atrium. Mild coronary artery calcifications. Mediastinum/Nodes: Esophagus and thyroid are unremarkable. No pathologically enlarged lymph nodes seen in the chest. Lungs/Pleura: Central airways are patent. Scattered small solid pulmonary nodules stable. Reference irregular solid pulmonary nodule of the right middle lobe measuring 10 x 7 mm on series 4, image 102, unchanged when compared with the prior exam and remeasured in similar plane. Musculoskeletal: Osseous lesion of the proximal right humerus demonstrates increased sclerosis when compared with the prior PET-CT possibly due to treatment response. CT ABDOMEN PELVIS FINDINGS Hepatobiliary: No suspicious liver lesion. Mild intra and extrahepatic biliary ductal dilation, unchanged when  compared with the prior exams. Pancreas: Unremarkable. No pancreatic ductal dilatation or surrounding inflammatory changes. Spleen: Normal in size without focal abnormality. Adrenals/Urinary Tract: Bilateral adrenal glands are unremarkable. No hydronephrosis or nephrolithiasis. Bilateral simple appearing renal cysts, no specific follow-up imaging is recommended for these lesions. No suspicious renal lesions. Stomach/Bowel: Postsurgical changes of prior APR with left lower quadrant colostomy. Normal appearance of the stomach. Normal appendix. No evidence of obstruction. Vascular/Lymphatic: Aortic atherosclerosis. Stable size of right inguinal and right pelvic lymph nodes. Reference right inguinal lymph node measuring 11 mm on series 2, image 105. Reference right inguinal lymph node measuring 6 mm on series 2, image 98, not significantly changed in size. Reproductive: No adnexal mass. Other: No abdominopelvic ascites. Musculoskeletal: Osseous irregularity of the left iliac bone, unchanged when compared with prior exam. IMPRESSION: 1. Stable scattered solid pulmonary nodules. 2. Right inguinal and right pelvic lymph nodes which were hypermetabolic on prior PET-CT are stable. 3. Osseous lesion of the proximal right humerus demonstrates increased sclerosis when compared with the prior PET-CT, possibly due to treatment response. 4. 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 5. Aortic Atherosclerosis (ICD10-I70.0). Electronically Signed   By: Yetta Glassman M.D.   On: 12/26/2022 09:05  CT CHEST ABDOMEN PELVIS W CONTRAST  Result Date: 12/26/2022 CLINICAL DATA:  Metastatic rectal adenocarcinoma; * Tracking Code: BO * EXAM: CT CHEST, ABDOMEN, AND PELVIS WITH CONTRAST TECHNIQUE: Multidetector CT imaging of the chest, abdomen and pelvis was performed following the standard protocol during bolus administration of intravenous contrast. RADIATION DOSE REDUCTION: This exam was performed according to the  departmental dose-optimization program which includes automated exposure control, adjustment of the mA and/or kV according to patient size and/or use of iterative reconstruction technique. CONTRAST:  162mL OMNIPAQUE IOHEXOL 300 MG/ML  SOLN COMPARISON:  PET-CT dated September 19, 2022 FINDINGS: CT CHEST FINDINGS Cardiovascular: Normal heart size. No pericardial effusion. Normal caliber thoracic aorta mild atherosclerotic disease. Right chest wall port with tip in the right atrium. Mild coronary artery calcifications. Mediastinum/Nodes: Esophagus and thyroid are unremarkable. No pathologically enlarged lymph nodes seen in the chest. Lungs/Pleura: Central airways are patent. Scattered small solid pulmonary nodules stable. Reference irregular solid pulmonary nodule of the right middle lobe measuring 10 x 7 mm on series 4, image 102, unchanged when compared with the prior exam and remeasured in similar plane. Musculoskeletal: Osseous lesion of the proximal right humerus demonstrates increased sclerosis when compared with the prior PET-CT possibly due to treatment response. CT ABDOMEN PELVIS FINDINGS Hepatobiliary: No suspicious liver lesion. Mild intra and extrahepatic biliary ductal dilation, unchanged when compared with the prior exams. Pancreas: Unremarkable. No pancreatic ductal dilatation or surrounding inflammatory changes. Spleen: Normal in size without focal abnormality. Adrenals/Urinary Tract: Bilateral adrenal glands are unremarkable. No hydronephrosis or nephrolithiasis. Bilateral simple appearing renal cysts, no specific follow-up imaging is recommended for these lesions. No suspicious renal lesions. Stomach/Bowel: Postsurgical changes of prior APR with left lower quadrant colostomy. Normal appearance of the stomach. Normal appendix. No evidence of obstruction. Vascular/Lymphatic: Aortic atherosclerosis. Stable size of right inguinal and right pelvic lymph nodes. Reference right inguinal lymph node measuring 11  mm on series 2, image 105. Reference right inguinal lymph node measuring 6 mm on series 2, image 98, not significantly changed in size. Reproductive: No adnexal mass. Other: No abdominopelvic ascites. Musculoskeletal: Osseous irregularity of the left iliac bone, unchanged when compared with prior exam. IMPRESSION: 1. Stable scattered solid pulmonary nodules. 2. Right inguinal and right pelvic lymph nodes which were hypermetabolic on prior PET-CT are stable. 3. Osseous lesion of the proximal right humerus demonstrates increased sclerosis when compared with the prior PET-CT, possibly due to treatment response. 4. 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 5. Aortic Atherosclerosis (ICD10-I70.0). Electronically Signed   By: Yetta Glassman M.D.   On: 12/26/2022 09:05

## 2023-01-16 NOTE — Assessment & Plan Note (Addendum)
History of stage IIIC Rectal cancer, s/p TNT, followed by 09/17/19 APR/posterior vaginectomy/TAH/BSO/VY-flap, pT4b pN0 with close vaginal margin 0.2 mm.  Uterus and ovaries negative for malignancy. palliative radiation to vaginal recurrence- 01/19/21 recurrence with lung metastasis.-Palliative -FOLFIRI plus bevacizumab.  Irinotecan was dropped in November 2022 due to side effects. Negative for UGT1A1*28 - radiographically stable, rise of CEA-July 2023 CT lung metastasis worse--> Dec 2023 PET showed progression in pelvic lymph nodes and bone lesions,--> 2nd line irinotecan +panitumumab-->CT March 2024 stable.  Labs are reviewed and discussed with patient, CEA is trending down.  Hold chemotherapy due to cellulitis

## 2023-01-16 NOTE — Assessment & Plan Note (Signed)
Stable, continue lomotil  QID PRN as instructed.  

## 2023-01-17 LAB — CEA: CEA: 516 ng/mL — ABNORMAL HIGH (ref 0.0–4.7)

## 2023-01-18 ENCOUNTER — Inpatient Hospital Stay: Payer: 59

## 2023-01-19 ENCOUNTER — Encounter: Payer: Self-pay | Admitting: Oncology

## 2023-01-19 ENCOUNTER — Ambulatory Visit
Admission: RE | Admit: 2023-01-19 | Discharge: 2023-01-19 | Disposition: A | Discharge status: Home / Self Care | Payer: 59 | Source: Ambulatory Visit | Attending: Family Medicine | Admitting: Family Medicine

## 2023-01-19 DIAGNOSIS — Z1231 Encounter for screening mammogram for malignant neoplasm of breast: Secondary | ICD-10-CM | POA: Diagnosis not present

## 2023-01-19 DIAGNOSIS — Z933 Colostomy status: Secondary | ICD-10-CM | POA: Diagnosis not present

## 2023-01-22 ENCOUNTER — Ambulatory Visit (INDEPENDENT_AMBULATORY_CARE_PROVIDER_SITE_OTHER): Payer: 59 | Admitting: Podiatry

## 2023-01-22 ENCOUNTER — Encounter: Payer: Self-pay | Admitting: Oncology

## 2023-01-22 ENCOUNTER — Encounter: Payer: Self-pay | Admitting: Podiatry

## 2023-01-22 VITALS — BP 132/63 | HR 69

## 2023-01-22 DIAGNOSIS — B351 Tinea unguium: Secondary | ICD-10-CM | POA: Diagnosis not present

## 2023-01-22 DIAGNOSIS — M79675 Pain in left toe(s): Secondary | ICD-10-CM

## 2023-01-22 DIAGNOSIS — M79674 Pain in right toe(s): Secondary | ICD-10-CM

## 2023-01-22 DIAGNOSIS — C20 Malignant neoplasm of rectum: Secondary | ICD-10-CM

## 2023-01-22 DIAGNOSIS — L0390 Cellulitis, unspecified: Secondary | ICD-10-CM

## 2023-01-22 NOTE — Telephone Encounter (Signed)
Please advise 

## 2023-01-22 NOTE — Progress Notes (Signed)
  Subjective:  Patient ID: Jacqueline Yoder, female    DOB: 09-06-1946,  MRN: [id number]  Chief Complaint  Patient presents with   Nail Problem    "My toenails are in the worst condition ever, I'm taking chemo."    77 y.o. female presents with the above complaint. History confirmed with patient.  Nails thickened elongated again, she does experience painful tingling and burning in her toes and fingers from the neuropathy from chemo  Objective:  Physical Exam: warm, good capillary refill, no trophic changes or ulcerative lesions, normal DP and PT pulses, and abnormal sensory exam with loss of protective sensation. Left Foot: dystrophic yellowed discolored nail plates with subungual debris Right Foot: dystrophic yellowed discolored nail plates with subungual debris   Assessment:   1. Pain due to onychomycosis of toenails of both feet      Plan:  Patient was evaluated and treated and all questions answered.   Discussed the etiology and treatment options for the condition in detail with the patient. Educated patient on the topical and oral treatment options for mycotic nails. Recommended debridement of the nails today. Sharp and mechanical debridement performed of all painful and mycotic nails today. Nails debrided in length and thickness using a nail nipper to level of comfort. Discussed treatment options including appropriate shoe gear. Follow up as needed for painful nails.    Return in about 3 months (around 04/23/2023) for painful thick fungal nails.

## 2023-01-23 ENCOUNTER — Encounter: Payer: Self-pay | Admitting: Oncology

## 2023-01-24 ENCOUNTER — Telehealth: Payer: Self-pay

## 2023-01-24 NOTE — Telephone Encounter (Signed)
Referral for home health nursing sent to [facility].  Ph: [phone] Fax: [phone]

## 2023-01-30 ENCOUNTER — Ambulatory Visit: Payer: 59

## 2023-01-30 ENCOUNTER — Inpatient Hospital Stay (HOSPITAL_BASED_OUTPATIENT_CLINIC_OR_DEPARTMENT_OTHER): Payer: 59 | Admitting: Oncology

## 2023-01-30 ENCOUNTER — Encounter: Payer: Self-pay | Admitting: Oncology

## 2023-01-30 ENCOUNTER — Other Ambulatory Visit: Payer: 59

## 2023-01-30 ENCOUNTER — Inpatient Hospital Stay: Payer: 59

## 2023-01-30 ENCOUNTER — Ambulatory Visit: Payer: 59 | Admitting: Oncology

## 2023-01-30 VITALS — BP 119/62 | HR 58 | Temp 97.0°F | Resp 18 | Wt 161.7 lb

## 2023-01-30 DIAGNOSIS — B372 Candidiasis of skin and nail: Secondary | ICD-10-CM | POA: Diagnosis not present

## 2023-01-30 DIAGNOSIS — Z79899 Other long term (current) drug therapy: Secondary | ICD-10-CM | POA: Diagnosis not present

## 2023-01-30 DIAGNOSIS — K219 Gastro-esophageal reflux disease without esophagitis: Secondary | ICD-10-CM | POA: Diagnosis not present

## 2023-01-30 DIAGNOSIS — C799 Secondary malignant neoplasm of unspecified site: Secondary | ICD-10-CM

## 2023-01-30 DIAGNOSIS — Z933 Colostomy status: Secondary | ICD-10-CM | POA: Diagnosis not present

## 2023-01-30 DIAGNOSIS — T451X5A Adverse effect of antineoplastic and immunosuppressive drugs, initial encounter: Secondary | ICD-10-CM

## 2023-01-30 DIAGNOSIS — K521 Toxic gastroenteritis and colitis: Secondary | ICD-10-CM

## 2023-01-30 DIAGNOSIS — K435 Parastomal hernia without obstruction or  gangrene: Secondary | ICD-10-CM | POA: Diagnosis not present

## 2023-01-30 DIAGNOSIS — E7800 Pure hypercholesterolemia, unspecified: Secondary | ICD-10-CM | POA: Diagnosis not present

## 2023-01-30 DIAGNOSIS — I700 Atherosclerosis of aorta: Secondary | ICD-10-CM | POA: Diagnosis not present

## 2023-01-30 DIAGNOSIS — R197 Diarrhea, unspecified: Secondary | ICD-10-CM | POA: Diagnosis not present

## 2023-01-30 DIAGNOSIS — D701 Agranulocytosis secondary to cancer chemotherapy: Secondary | ICD-10-CM | POA: Diagnosis not present

## 2023-01-30 DIAGNOSIS — L0390 Cellulitis, unspecified: Secondary | ICD-10-CM | POA: Diagnosis not present

## 2023-01-30 DIAGNOSIS — E876 Hypokalemia: Secondary | ICD-10-CM | POA: Diagnosis not present

## 2023-01-30 DIAGNOSIS — Z86718 Personal history of other venous thrombosis and embolism: Secondary | ICD-10-CM | POA: Diagnosis not present

## 2023-01-30 DIAGNOSIS — Z800 Family history of malignant neoplasm of digestive organs: Secondary | ICD-10-CM | POA: Diagnosis not present

## 2023-01-30 DIAGNOSIS — Z7901 Long term (current) use of anticoagulants: Secondary | ICD-10-CM | POA: Diagnosis not present

## 2023-01-30 DIAGNOSIS — Z5111 Encounter for antineoplastic chemotherapy: Secondary | ICD-10-CM | POA: Diagnosis not present

## 2023-01-30 DIAGNOSIS — R609 Edema, unspecified: Secondary | ICD-10-CM | POA: Diagnosis not present

## 2023-01-30 DIAGNOSIS — C20 Malignant neoplasm of rectum: Secondary | ICD-10-CM | POA: Diagnosis not present

## 2023-01-30 DIAGNOSIS — R634 Abnormal weight loss: Secondary | ICD-10-CM | POA: Diagnosis not present

## 2023-01-30 DIAGNOSIS — Z5189 Encounter for other specified aftercare: Secondary | ICD-10-CM | POA: Diagnosis not present

## 2023-01-30 DIAGNOSIS — I10 Essential (primary) hypertension: Secondary | ICD-10-CM | POA: Diagnosis not present

## 2023-01-30 DIAGNOSIS — D6481 Anemia due to antineoplastic chemotherapy: Secondary | ICD-10-CM | POA: Diagnosis not present

## 2023-01-30 DIAGNOSIS — I2510 Atherosclerotic heart disease of native coronary artery without angina pectoris: Secondary | ICD-10-CM | POA: Diagnosis not present

## 2023-01-30 DIAGNOSIS — Z5112 Encounter for antineoplastic immunotherapy: Secondary | ICD-10-CM | POA: Diagnosis not present

## 2023-01-30 DIAGNOSIS — Z87891 Personal history of nicotine dependence: Secondary | ICD-10-CM | POA: Diagnosis not present

## 2023-01-30 DIAGNOSIS — Z86711 Personal history of pulmonary embolism: Secondary | ICD-10-CM

## 2023-01-30 LAB — CBC WITH DIFFERENTIAL/PLATELET
Abs Immature Granulocytes: 0.01 10*3/uL (ref 0.00–0.07)
Basophils Absolute: 0 10*3/uL (ref 0.0–0.1)
Basophils Relative: 1 %
Eosinophils Absolute: 0.2 10*3/uL (ref 0.0–0.5)
Eosinophils Relative: 7 %
HCT: 34.4 % — ABNORMAL LOW (ref 36.0–46.0)
Hemoglobin: 10.8 g/dL — ABNORMAL LOW (ref 12.0–15.0)
Immature Granulocytes: 0 %
Lymphocytes Relative: 36 %
Lymphs Abs: 1.2 10*3/uL (ref 0.7–4.0)
MCH: 29.8 pg (ref 26.0–34.0)
MCHC: 31.4 g/dL (ref 30.0–36.0)
MCV: 95 fL (ref 80.0–100.0)
Monocytes Absolute: 0.4 10*3/uL (ref 0.1–1.0)
Monocytes Relative: 13 %
Neutro Abs: 1.5 10*3/uL — ABNORMAL LOW (ref 1.7–7.7)
Neutrophils Relative %: 43 %
Platelets: 243 10*3/uL (ref 150–400)
RBC: 3.62 MIL/uL — ABNORMAL LOW (ref 3.87–5.11)
RDW: 17.2 % — ABNORMAL HIGH (ref 11.5–15.5)
WBC: 3.4 10*3/uL — ABNORMAL LOW (ref 4.0–10.5)
nRBC: 0 % (ref 0.0–0.2)

## 2023-01-30 LAB — COMPREHENSIVE METABOLIC PANEL
ALT: 23 U/L (ref 0–44)
AST: 37 U/L (ref 15–41)
Albumin: 3.5 g/dL (ref 3.5–5.0)
Alkaline Phosphatase: 58 U/L (ref 38–126)
Anion gap: 8 (ref 5–15)
BUN: 14 mg/dL (ref 8–23)
CO2: 26 mmol/L (ref 22–32)
Calcium: 8.7 mg/dL — ABNORMAL LOW (ref 8.9–10.3)
Chloride: 104 mmol/L (ref 98–111)
Creatinine, Ser: 0.7 mg/dL (ref 0.44–1.00)
GFR, Estimated: >60 mL/min (ref >60)
Glucose, Bld: 93 mg/dL (ref 70–99)
Potassium: 3.5 mmol/L (ref 3.5–5.1)
Sodium: 138 mmol/L (ref 135–145)
Total Bilirubin: 0.5 mg/dL (ref 0.3–1.2)
Total Protein: 6.5 g/dL (ref 6.5–8.1)

## 2023-01-30 LAB — MAGNESIUM: Magnesium: 1.4 mg/dL — ABNORMAL LOW (ref 1.7–2.4)

## 2023-01-30 MED ORDER — HEPARIN SOD (PORK) LOCK FLUSH 100 UNIT/ML IV SOLN
500.0000 [IU] | Freq: Once | INTRAVENOUS | Status: AC | PRN
  Administered 2023-01-30: 500 [IU]
  Filled 2023-01-30: qty 5

## 2023-01-30 MED ORDER — PALONOSETRON HCL INJECTION 0.25 MG/5ML
0.2500 mg | Freq: Once | INTRAVENOUS | Status: AC
  Administered 2023-01-30: 0.25 mg via INTRAVENOUS
  Filled 2023-01-30: qty 5

## 2023-01-30 MED ORDER — MAGNESIUM SULFATE 4 GM/100ML IV SOLN
4.0000 g | Freq: Once | INTRAVENOUS | Status: AC
  Administered 2023-01-30: 4 g via INTRAVENOUS
  Filled 2023-01-30: qty 100

## 2023-01-30 MED ORDER — SODIUM CHLORIDE 0.9 % IV SOLN
10.0000 mg | Freq: Once | INTRAVENOUS | Status: AC
  Administered 2023-01-30: 10 mg via INTRAVENOUS
  Filled 2023-01-30: qty 10
  Filled 2023-01-30: qty 1

## 2023-01-30 MED ORDER — DIPHENHYDRAMINE HCL 50 MG/ML IJ SOLN
25.0000 mg | Freq: Once | INTRAMUSCULAR | Status: AC
  Administered 2023-01-30: 25 mg via INTRAVENOUS
  Filled 2023-01-30: qty 1

## 2023-01-30 MED ORDER — SODIUM CHLORIDE 0.9 % IV SOLN
100.0000 mg/m2 | Freq: Once | INTRAVENOUS | Status: AC
  Administered 2023-01-30: 180 mg via INTRAVENOUS
  Filled 2023-01-30: qty 5

## 2023-01-30 MED ORDER — SODIUM CHLORIDE 0.9 % IV SOLN
6.0000 mg/kg | Freq: Once | INTRAVENOUS | Status: AC
  Administered 2023-01-30: 400 mg via INTRAVENOUS
  Filled 2023-01-30: qty 20

## 2023-01-30 MED ORDER — SODIUM CHLORIDE 0.9 % IV SOLN
Freq: Once | INTRAVENOUS | Status: AC
  Filled 2023-01-30: qty 250

## 2023-01-30 MED ORDER — MAGNESIUM CHLORIDE 64 MG PO TBEC: 1.0000 | DELAYED_RELEASE_TABLET | Freq: Two times a day (BID) | ORAL | 1 refills | Status: DC

## 2023-01-30 MED ORDER — ATROPINE SULFATE 1 MG/ML IV SOLN
0.5000 mg | Freq: Once | INTRAVENOUS | Status: AC
  Administered 2023-01-30: 0.5 mg via INTRAVENOUS
  Filled 2023-01-30: qty 1

## 2023-01-30 NOTE — Assessment & Plan Note (Addendum)
Skin candidiasis with superimposed bacterial infection.  Improved.  Recommend patient to keep area dry, continue Nystatin poweder

## 2023-01-30 NOTE — Assessment & Plan Note (Signed)
IV Mag 2g PRN Mag <=1.6.  Continue slow mag orally  twice daily.

## 2023-01-30 NOTE — Assessment & Plan Note (Addendum)
History of stage IIIC Rectal cancer, s/p TNT, followed by 09/17/19 APR/posterior vaginectomy/TAH/BSO/VY-flap, pT4b pN0 with close vaginal margin 0.2 mm.  Uterus and ovaries negative for malignancy. palliative radiation to vaginal recurrence- 01/19/21 recurrence with lung metastasis.-Palliative -FOLFIRI plus bevacizumab.  Irinotecan was dropped in November 2022 due to side effects. Negative for UGT1A1*28 - radiographically stable, rise of CEA-July 2023 CT lung metastasis worse--> Dec 2023 PET showed progression in pelvic lymph nodes and bone lesions,--> 2nd line irinotecan +panitumumab-->CT March 2024 stable.  Labs are reviewed and discussed with patient, CEA is trending down.  Proceed with irinotecan +panitumumab

## 2023-01-30 NOTE — Progress Notes (Signed)
Pt here for follow up. No new concerns voiced.   

## 2023-01-30 NOTE — Patient Instructions (Signed)
Implanted Port Removal  Implanted port removal is a procedure to remove the port and catheter that are implanted under your skin. The port is a small disc under your skin that can be punctured with a needle. It is connected to a vein in your chest or neck by a small, thin tube (catheter). The implanted port is used to give medicines for treatments, and it may also be used to take blood samples. Your health care provider will remove the implanted port if: You no longer need it for treatment. It is not working properly. The area around it gets infected. Tell a health care provider about: Any allergies you have. All medicines you are taking, including vitamins, herbs, eye drops, creams, and over-the-counter medicines. Any problems you or family members have had with anesthetic medicines. Any bleeding problems you have. Any surgeries you have had. Any medical conditions you have. Whether you are pregnant or may be pregnant. What are the risks? Generally, this is a safe procedure. However, problems may occur, including: Infection. Bleeding. Allergic reactions to anesthetic medicines. Damage to nerves or blood vessels. What happens before the procedure? Medicines Ask your health care provider about: Changing or stopping your regular medicines. This is especially important if you are taking diabetes medicines or blood thinners. Taking medicines such as aspirin and ibuprofen. These medicines can thin your blood. Do not take these medicines unless your health care provider tells you to take them. Taking over-the-counter medicines, vitamins, herbs, and supplements. Tests You will have: A physical exam. Blood tests. Imaging tests, including a chest X-ray. General instructions Follow instructions from your health care provider about eating or drinking restrictions. Ask your health care provider: How your surgery site will be marked. What steps will be taken to help prevent infection. These  steps may include: Removing hair at the surgery site. Washing skin with a germ-killing soap. Taking antibiotic medicine. If you will be going home right after the procedure, plan to have a responsible adult: Take you home from the hospital or clinic. You will not be allowed to drive. Care for you for the time you are told. What happens during the procedure? You may be given one or more of the following: A medicine to help you relax (sedative). A medicine to numb the area (local anesthetic). A small incision will be made at the site of your implanted port. The implanted port and the catheter that has been inside your vein will be gently removed. The port and catheter will be inspected to make sure that all the parts have been removed. Part of the catheter may be tested for bacteria. The incision will be closed with stitches (sutures), adhesive strips, or skin glue. A bandage (dressing) will be placed over the incision. The health care provider may apply gentle pressure over the dressing for about 5 minutes. The procedure may vary among health care providers and hospitals. What happens after the procedure? Your blood pressure, heart rate, breathing rate, and blood oxygen level will be monitored until you leave the hospital or clinic. You will be monitored to make sure that there is no bleeding from the site where the port was removed. If you were given a sedative during the procedure, it can affect you for several hours. Do not drive or operate machinery until your health care provider says that it is safe. Summary Implanted port removal is a procedure to remove the port and catheter that are implanted under your skin. Before the procedure, follow your health   care provider's instructions about changing or stopping your regular medicines. This is especially important if you are taking diabetes medicines or blood thinners. If you will be going home right after the procedure, plan to have a  responsible adult care for you for the time you are told. This information is not intended to replace advice given to you by your health care provider. Make sure you discuss any questions you have with your health care provider. Document Revised: 04/05/2021 Document Reviewed: 04/05/2021 [employer] Patient Education  2023 [employer]  

## 2023-01-30 NOTE — Assessment & Plan Note (Signed)
Continue  Eliquis 2.5 mg twice daily for anticoagulation prophylaxis. 

## 2023-01-30 NOTE — Assessment & Plan Note (Addendum)
Hb decreased, as expected on chemotherapy.  observation

## 2023-01-30 NOTE — Assessment & Plan Note (Signed)
Chemotherapy plan as listed above 

## 2023-01-30 NOTE — Progress Notes (Signed)
Hematology/Oncology Progress note [phone] [phone]      CHIEF COMPLAINTS/REASON FOR VISIT:  Follow up for rectal cancer  ASSESSMENT & PLAN:   Cancer Staging  Rectal cancer Staging form: Colon and Rectum, AJCC 8th Edition - Pathologic stage from 10/06/2019: Stage IIC (ypT4b, pN0, cM0) - Signed by [name], MD on 10/06/2019 - Clinical stage from 06/30/2020: Jacqueline Yoder - Signed by [name], MD on 04/17/2022 - Pathologic: Stage Unknown (rpTX, pNX, cM1) - Signed by [name], MD on 01/31/2021   Rectal cancer History of stage IIIC Rectal cancer, s/p TNT, followed by 09/17/19 APR/posterior vaginectomy/TAH/BSO/VY-flap, pT4b pN0 with close vaginal margin 0.2 mm.  Uterus and ovaries negative for malignancy. palliative radiation to vaginal recurrence- 01/19/21 recurrence with lung metastasis.-Palliative -FOLFIRI plus bevacizumab.  Irinotecan was dropped in November 2022 due to side effects. Negative for UGT1A1*28 - radiographically stable, rise of CEA-July 2023 CT lung metastasis worse--> Dec 2023 PET showed progression in pelvic lymph nodes and bone lesions,--> 2nd line irinotecan +panitumumab-->CT March 2024 stable.  Labs are reviewed and discussed with patient, CEA is trending down.  Proceed with irinotecan +panitumumab   Encounter for antineoplastic chemotherapy Chemotherapy plan as listed above.   Anemia due to antineoplastic chemotherapy Hb decreased, as expected on chemotherapy.  observation  Chemotherapy induced neutropenia (HCC) patient will receive long-acting GCSF prophylaxis on D3  History of pulmonary embolism Continue  Eliquis 2.5 mg twice daily for anticoagulation prophylaxis.  Chemotherapy induced diarrhea Stable, continue lomotil  QID PRN as instructed.   Hypokalemia  potassium is stable.  Continue potassium chloride 20 mEq  daily.   Hypomagnesemia IV Mag 2g PRN Mag <=1.6.  Continue slow mag orally 64mg  twice daily.   Cellulitis Skin  candidiasis with superimposed bacterial infection.  Improved.  Recommend patient to keep area dry, continue Nystatin poweder   Follow up 2 weeks Lab MD irinotecan + panitumumab All questions were answered. The patient knows to call the clinic with any problems, questions or concerns.  [name], MD, PhD [facility] Hematology Oncology 01/30/2023      HISTORY OF PRESENTING ILLNESS:  Patient initially presented with complaints of postmenopausal bleeding on 08/16/2018.  History of was menopausal vaginal bleeding in 2016 which resulted in cervical polypectomy.  Pathology 02/04/2015 showed cervical polyp, consistent with benign endometrial polyp.  [name] after polypectomy due to anxiety associated with pelvic exams.  pelvic exam on 08/16/2018 reviewed cervical abnormality and from enlarged uterus. Seen by Dr. [name] on 10/29/2018.  Endometrial biopsy and a Pap smear was performed. 10/29/2018 Pap smear showed adenocarcinoma, favor endometrial origin. 10/29/2018 endometrial biopsy showed endometrioid carcinoma, FIGO grade 1.  10/29/2018- TA & TV Ultrasound revealed: Anteverted uterus measuring 8.7 x 5.6 x 6.4 cm without evidence of focal masses.  The endometrium measuring 24.1 mm (thickened) and heterogeneous.  Right and left ovaries not visualized.  No adnexal masses identified.  No free fluid in cul-de-sac.  Patient was seen by Dr. [name] in clinic on 11/13/2018.  Cervical exam reveals 2 cm exophytic irregular mass consistent with malignancy.   11/19/2018 CT chest abdomen pelvis with contrast showed thickened endometrium with some irregularity compatible with the provided diagnosis of endometrial malignancy.  There is a mildly prominent left inguinal node 1.4 cm.  Patient was seen by Dr. [name] on 11/20/2018 and left groin lymph node biopsy was recommended.  11/26/2018 patient underwent left inguinal lymph node biopsy. Pathology showed metastatic adenocarcinoma consistent with colorectal  origin.  CDX 2+.  Case was discussed on tumor board.  Recommend colonoscopy for further evaluation.  Patient reports significant weight loss 30 pounds over the last year.  Chronic vaginal spotting. Change of bowel habits the past few months.  More constipated.  Family history positive for brother who has colon cancer prostate cancer.  patient has underwent colonoscopy on 12/03/2018 which reviewed a nonobstructing large mass in the rectum.  Also chronic fistula.  Mass was not circumferential.  This was biopsied with a cold forceps for histology.  Pathology came back hyperplastic polyp negative for dysplasia and malignancy. [name], patient underwent flex sigmoidoscopy on 12/06/2018 with rebiopsy of the rectal mass. This time biopsy results came back positive for invasive colorectal adenocarcinoma, moderately differentiated. Immunotherapy for nearly mismatch repair protein (MMR ) was performed.  There is no loss of MMR expression.  low probability of MSI high.   # Seen by [name] for evaluation of resectability for rectal cancer. In addition, she also had a second opinion with [facility] where her endometrial biopsy pathology was changed to  adenocarcinoma, consistent with colorectal primary.   [name]. She has home health that has been assisting with ostomy care  Patient was also evaluated by [facility] oncology.  Recommendation is to proceed with TNT with concurrent chemoradiation followed by neoadjuvant chemotherapy followed by surgical resection. Patient prefers to have treatment done locally with [facility].   # Oncology Treatment:  02/03/2019- 03/19/2019  concurrent Xeloda and radiation.  Xeloda dose 825mg  /m2 BID - rounded to 1650mg  BID- on days of radiation. 04/09/2019, started on FOLFOX with bolus early.  Omitted.  07/16/2019 finished 8 cycles of FOLFOX. 09/17/19 APR/posterior vaginectomy/TAH/BSO/VY-flap pT4b pN0 with close vaginal  margin 0.2 mm.  Uterus and ovaries negative for malignancy. Patient reports bilateral lower extremity numbness and tingling, intermittent, left worse than right. She has lost a lot of weight since her APR surgery.   #Family history with half brother having's history of colon cancer prostate cancer.  Personal history of colorectal cancer.  Patient has not decided if she wants genetic testing.    # history of PE( 01/13/2020)  in the bilateral lower extremity DVT (01/13/2020).   She finishes 6 months of anticoagulation with Eliquis 5 mg twice daily. Now switched to Eliquis 2.5 mg twice daily..  # She has now developed recurrent disease. #06/30/20  vaginal introitus mass biopsied. Pathology is consistent with metastatic colorectal adenocarcinoma I have discussed with [name]  Dr. Luciano Cutter and the mass is not resectable. Patient has also had colonoscopy by Dr. Tobi Bastos yesterday. Normal examination. # 07/16/2020 cycle 1 FOLFIRI  # 07/20/2020 PET scan was done for further evaluation, images are consistent with local recurrence, no distant metastasis. #Discussed with radiation oncology Dr. Rushie Chestnut will recommends concurrent chemotherapy and radiation. 08/02/2020-08/16/2020, patient starts radiation.  Xeloda was held due to neutropenia 08/17/2020,-09/06/2020 Xeloda 1500 mg twice daily concurrently with radiation  01/31/21 started on FOLFIRI + Bev 05/18/2021 CT chest abdomen pelvis showed Previously noted enlargement of bilateral inguinal lymph nodes is resolved, consistent with treatment response of nodal metastatic disease. Interval decrease in size of multiple small bilateral pulmonary nodules, consistent with treatment response of pulmonary metastatic disease. No evidence of new metastatic disease. 05/24/2021 - 08/30/2021, continued on FOLFIRI plus bevacizumab.  Irinotecan dose was reduced, eventually 100mg /m2  09/02/2021, CT chest abdomen pelvis without contrast Showed small bilateral pulmonary nodules,  unchanged.  Stable metastatic disease.  No noncontrast evidence of new metastatic disease  in the chest abdomen pelvis.  Small parastomal hernia.  Enlargement of main pulmonary artery.  Coronary artery disease.  09/13/2021, maintenance 5-FU/bevacizumab 11/28/2021, 5-FU/Irinotecan/bevacizumab.  Irinotecan 100 mg/m2 was added back due to progressively increasing CEA.    INTERVAL HISTORY Jacqueline Yoder is a 77 y.o. female who has above history reviewed by me presents for follow-up of rectal cancer. On Irinotecan and panitumumab. she tolerates well.  - Rash under the breast,and groin area, she has finished abx and continued using topicals. Improved.  Diarrhea is manageable.    Review of Systems  Constitutional:  Positive for fatigue. Negative for appetite change, chills, fever and unexpected weight change.  HENT:   Negative for hearing loss and voice change.   Eyes:  Negative for eye problems.  Respiratory:  Negative for chest tightness and cough.   Cardiovascular:  Negative for chest pain.  Gastrointestinal:  Negative for abdominal distention, abdominal pain, blood in stool, constipation, diarrhea and nausea.  Endocrine: Negative for hot flashes.  Genitourinary:  Negative for difficulty urinating and frequency.   Musculoskeletal:  Positive for arthralgias.  Skin:  Positive for rash. Negative for itching.  Neurological:  Negative for extremity weakness and numbness.  Hematological:  Negative for adenopathy.  Psychiatric/Behavioral:  Negative for confusion.     MEDICAL HISTORY:  Past Medical History:  Diagnosis Date   Allergy    Arthritis    Blood clot in vein    Family history of colon cancer    GERD (gastroesophageal reflux disease)    Hypercholesteremia    Hypertension    Hypertension    Lower extremity edema    Personal history of chemotherapy    Rectal cancer 12/2018   Urinary incontinence     SURGICAL HISTORY: Past Surgical History:  Procedure Laterality Date    ABDOMINAL HYSTERECTOMY     CHOLECYSTECTOMY  1971   COLONOSCOPY WITH PROPOFOL N/A 12/03/2018   Procedure: COLONOSCOPY WITH PROPOFOL;  Surgeon: Midge Minium, MD;  Location: [facility] ENDOSCOPY;  Service: Endoscopy;  Laterality: N/A;   COLONOSCOPY WITH PROPOFOL N/A 07/15/2020   Procedure: COLONOSCOPY WITH PROPOFOL;  Surgeon: Wyline Mood, MD;  Location: [facility] ENDOSCOPY;  Service: Gastroenterology;  Laterality: N/A;   FLEXIBLE SIGMOIDOSCOPY N/A 12/06/2018   Procedure: FLEXIBLE SIGMOIDOSCOPY;  Surgeon: Wyline Mood, MD;  Location: [facility] ENDOSCOPY;  Service: Endoscopy;  Laterality: N/A;   LAPAROSCOPIC COLOSTOMY  01/06/2019   PORTACATH PLACEMENT N/A 04/03/2019   Procedure: INSERTION PORT-A-CATH;  Surgeon: Leafy Ro, MD;  Location: [facility] ORS;  Service: General;  Laterality: N/A;    SOCIAL HISTORY: Social History   Socioeconomic History   Marital status: Widowed    Spouse name: Not on file   Number of children: Not on file   Years of education: Not on file   Highest education level: Not on file  Occupational History   Not on file  Tobacco Use   Smoking status: Former    Types: Cigarettes    Quit date: 12/02/1977    Years since quitting: 45.1   Smokeless tobacco: Former  Building services engineer Use: Never used  Substance and Sexual Activity   Alcohol use: Never   Drug use: Never   Sexual activity: Not Currently    Birth control/protection: None  Other Topics Concern   Not on file  Social History Narrative   Lives with daughter   Social Determinants of Health   Financial Resource Strain: Medium Risk (06/30/2022)   Overall Financial Resource Strain (CARDIA)    Difficulty  of Paying Living Expenses: Somewhat hard  Food Insecurity: No Food Insecurity (06/30/2022)   Hunger Vital Sign    Worried About Running Out of Food in the Last Year: Never true    Ran Out of Food in the Last Year: Never true  Transportation Needs: No Transportation Needs (06/30/2022)   PRAPARE - Scientist, research (physical sciences) (Medical): No    Lack of Transportation (Non-Medical): No  Physical Activity: Inactive (06/30/2022)   Exercise Vital Sign    Days of Exercise per Week: 0 days    Minutes of Exercise per Session: 0 min  Stress: No Stress Concern Present (06/30/2022)   [employer] of Occupational Health - Occupational Stress Questionnaire    Feeling of Stress : Not at all  Social Connections: Moderately Isolated (06/30/2022)   Social Connection and Isolation Panel [NHANES]    Frequency of Communication with Friends and Family: More than three times a week    Frequency of Social Gatherings with Friends and Family: Twice a week    Attends Religious Services: More than 4 times per year    Active Member of [employer] or Organizations: No    Attends Banker Meetings: Never    Marital Status: Widowed  Intimate Partner Violence: Not At Risk (06/30/2022)   Humiliation, Afraid, Rape, and Kick questionnaire    Fear of Current or Ex-Partner: No    Emotionally Abused: No    Physically Abused: No    Sexually Abused: No    FAMILY HISTORY: Family History  Problem Relation Age of Onset   Colon cancer Brother 53       exposure to chemicals [place]   Hypertension Mother    Stroke Mother    Kidney failure Father    Breast cancer Neg Hx    Ovarian cancer Neg Hx     ALLERGIES:  is allergic to sulfamethoxazole-trimethoprim.  MEDICATIONS:  Current Outpatient Medications  Medication Sig Dispense Refill   Cholecalciferol (VITAMIN D3) 2000 units capsule Take 2,000 Units by mouth daily.     cholestyramine (QUESTRAN) 4 g packet Take 1 packet (4 g total) by mouth 3 (three) times daily. 90 each 1   clindamycin (CLINDAGEL) 1 % gel Apply topically 2 (two) times daily. 30 g 5   diclofenac sodium (VOLTAREN) 1 % GEL Apply 2 g topically 4 (four) times daily as needed (joint pain).  11   diphenoxylate-atropine (LOMOTIL) 2.5-0.025 MG tablet Take 1 tablet by mouth 4 (four) times daily as needed for  diarrhea or loose stools. 60 tablet 1   ELIQUIS 2.5 MG TABS tablet TAKE 1 TABLET BY MOUTH TWICE  DAILY 200 tablet 2   fluticasone (FLONASE) 50 MCG/ACT nasal spray USE 1 SPRAY IN EACH NOSTRIL ONCE DAILY 16 g 3   ketoconazole (NIZORAL) 2 % cream Apply 1 Application topically daily. For up to 2 weeks. May repeat if need. 30 g 2   lidocaine-prilocaine (EMLA) cream Apply 1 application. topically as needed. 30 g 6   loperamide (IMODIUM) 2 MG capsule Take 1 capsule (2 mg total) by mouth See admin instructions. With onset of loose stool, take 4mg  followed by 2mg  every 2 hours,  Maximum: 16 mg/day 120 capsule 1   loratadine (CLARITIN) 10 MG tablet Take 10 mg by mouth daily.     Multiple Vitamins-Minerals (ONE-A-DAY WOMENS 50 PLUS PO) Take 1 tablet by mouth daily.      nystatin (MYCOSTATIN/NYSTOP) powder Apply 1 Application topically 3 (three) times daily. 45 g  0   potassium chloride SA (KLOR-CON M) 20 MEQ tablet Take 1 tablet (20 mEq total) by mouth daily. 100 tablet 1   simvastatin (ZOCOR) 40 MG tablet Take 1 tablet (40 mg total) by mouth at bedtime. 100 tablet 3   triamcinolone cream (KENALOG) 0.5 % Apply 1 Application topically 2 (two) times daily. To affected areas, for up to 2 weeks. 30 g 2   triamterene-hydrochlorothiazide (DYAZIDE) 37.5-25 MG capsule Take 1 each (1 capsule total) by mouth daily. 100 capsule 3   zinc gluconate 50 MG tablet Take 50 mg by mouth daily.     magnesium chloride (SLOW-MAG) 64 MG TBEC SR tablet Take 1 tablet (64 mg total) by mouth 2 (two) times daily. 30 tablet 1   No current facility-administered medications for this visit.   Facility-Administered Medications Ordered in Other Visits  Medication Dose Route Frequency Provider Last Rate Last Admin   heparin lock flush 100 unit/mL  500 Units Intracatheter Once PRN [name], MD       irinotecan (CAMPTOSAR) 180 mg in sodium chloride 0.9 % 500 mL chemo infusion  100 mg/m2 (Treatment Plan Recorded) Intravenous Once [name], MD  339 mL/hr at 01/30/23 1052 180 mg at 01/30/23 1052   magnesium sulfate IVPB 4 g 100 mL  4 g Intravenous Once [name], MD 50 mL/hr at 01/30/23 0942 4 g at 01/30/23 0942     PHYSICAL EXAMINATION: ECOG PERFORMANCE STATUS: 1 - Symptomatic but completely ambulatory  Physical Exam Constitutional:      General: She is not in acute distress. HENT:     Head: Normocephalic and atraumatic.  Eyes:     General: No scleral icterus. Cardiovascular:     Rate and Rhythm: Normal rate.     Heart sounds: Normal heart sounds.  Pulmonary:     Effort: Pulmonary effort is normal. No respiratory distress.     Breath sounds: No wheezing.  Abdominal:     General: Bowel sounds are normal. There is no distension.     Palpations: Abdomen is soft.     Comments: + Colostomy bag   Musculoskeletal:        General: No deformity. Normal range of motion.     Cervical back: Normal range of motion and neck supple.  Skin:    General: Skin is warm and dry.     Findings: Rash present. No erythema.     Comments: Erythematous skin changes under breast and bilateral groin area  Neurological:     Mental Status: She is alert and oriented to person, place, and time. Mental status is at baseline.     Cranial Nerves: No cranial nerve deficit.     Coordination: Coordination normal.       LABORATORY DATA:  I have reviewed the data as listed    Latest Ref Rng & Units 01/30/2023    8:15 AM 01/16/2023    7:55 AM 01/02/2023    8:42 AM  CBC  WBC 4.0 - 10.5 K/uL 3.4  6.8  5.7   Hemoglobin 12.0 - 15.0 g/dL 52.8  41.3  24.4   Hematocrit 36.0 - 46.0 % 34.4  34.7  36.5   Platelets 150 - 400 K/uL 243  228  224       Latest Ref Rng & Units 01/30/2023    8:15 AM 01/16/2023    7:55 AM 01/02/2023    8:42 AM  CMP  Glucose 70 - 99 mg/dL 93  97  97  BUN 8 - 23 mg/dL 14  14  16    Creatinine 0.44 - 1.00 mg/dL 6.96  2.95  2.84   Sodium 135 - 145 mmol/L 138  138  139   Potassium 3.5 - 5.1 mmol/L 3.5  3.6  3.7   Chloride 98 - 111  mmol/L 104  103  104   CO2 22 - 32 mmol/L 26  27  27    Calcium 8.9 - 10.3 mg/dL 8.7  8.8  8.9   Total Protein 6.5 - 8.1 g/dL 6.5  6.5  6.7   Total Bilirubin 0.3 - 1.2 mg/dL 0.5  0.5  0.6   Alkaline Phos 38 - 126 U/L 58  82  80   AST 15 - 41 U/L 37  38  32   ALT 0 - 44 U/L 23  28  23       RADIOGRAPHIC STUDIES: I have personally reviewed the radiological images as listed and agreed with the findings in the report. MM 3D SCREEN BREAST BILATERAL  Result Date: 01/22/2023 CLINICAL DATA:  Screening. EXAM: DIGITAL SCREENING BILATERAL MAMMOGRAM WITH TOMOSYNTHESIS AND CAD TECHNIQUE: Bilateral screening digital craniocaudal and mediolateral oblique mammograms were obtained. Bilateral screening digital breast tomosynthesis was performed. The images were evaluated with computer-aided detection. COMPARISON:  Previous exam(s). ACR Breast Density Category b: There are scattered areas of fibroglandular density. FINDINGS: There are no findings suspicious for malignancy. IMPRESSION: No mammographic evidence of malignancy. A result letter of this screening mammogram will be mailed directly to the patient. RECOMMENDATION: Screening mammogram in one year. (Code:SM-B-01Y) BI-RADS CATEGORY  1: Negative. Electronically Signed   By: Bary Richard M.D.   On: 01/22/2023 11:01   CT CHEST ABDOMEN PELVIS W CONTRAST  Result Date: 12/26/2022 CLINICAL DATA:  Metastatic rectal adenocarcinoma; * Tracking Code: BO * EXAM: CT CHEST, ABDOMEN, AND PELVIS WITH CONTRAST TECHNIQUE: Multidetector CT imaging of the chest, abdomen and pelvis was performed following the standard protocol during bolus administration of intravenous contrast. RADIATION DOSE REDUCTION: This exam was performed according to the departmental dose-optimization program which includes automated exposure control, adjustment of the mA and/or kV according to patient size and/or use of iterative reconstruction technique. CONTRAST:  OMNIPAQUE IOHEXOL 300 MG/ML  SOLN  COMPARISON:  PET-CT dated September 19, 2022 FINDINGS: CT CHEST FINDINGS Cardiovascular: Normal heart size. No pericardial effusion. Normal caliber thoracic aorta mild atherosclerotic disease. Right chest wall port with tip in the right atrium. Mild coronary artery calcifications. Mediastinum/Nodes: Esophagus and thyroid are unremarkable. No pathologically enlarged lymph nodes seen in the chest. Lungs/Pleura: Central airways are patent. Scattered small solid pulmonary nodules stable. Reference irregular solid pulmonary nodule of the right middle lobe measuring 10 x 7 mm on series 4, image 102, unchanged when compared with the prior exam and remeasured in similar plane. Musculoskeletal: Osseous lesion of the proximal right humerus demonstrates increased sclerosis when compared with the prior PET-CT possibly due to treatment response. CT ABDOMEN PELVIS FINDINGS Hepatobiliary: No suspicious liver lesion. Mild intra and extrahepatic biliary ductal dilation, unchanged when compared with the prior exams. Pancreas: Unremarkable. No pancreatic ductal dilatation or surrounding inflammatory changes. Spleen: Normal in size without focal abnormality. Adrenals/Urinary Tract: Bilateral adrenal glands are unremarkable. No hydronephrosis or nephrolithiasis. Bilateral simple appearing renal cysts, no specific follow-up imaging is recommended for these lesions. No suspicious renal lesions. Stomach/Bowel: Postsurgical changes of prior APR with left lower quadrant colostomy. Normal appearance of the stomach. Normal appendix. No evidence of obstruction. Vascular/Lymphatic: Aortic atherosclerosis. Stable size of  right inguinal and right pelvic lymph nodes. Reference right inguinal lymph node measuring 11 mm on series 2, image 105. Reference right inguinal lymph node measuring 6 mm on series 2, image 98, not significantly changed in size. Reproductive: No adnexal mass. Other: No abdominopelvic ascites. Musculoskeletal: Osseous irregularity  of the left iliac bone, unchanged when compared with prior exam. IMPRESSION: 1. Stable scattered solid pulmonary nodules. 2. Right inguinal and right pelvic lymph nodes which were hypermetabolic on prior PET-CT are stable. 3. Osseous lesion of the proximal right humerus demonstrates increased sclerosis when compared with the prior PET-CT, possibly due to treatment response. 4. 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 5. Aortic Atherosclerosis (ICD10-I70.0). Electronically Signed   By: Allegra Lai M.D.   On: 12/26/2022 09:05      MM 3D SCREEN BREAST BILATERAL  Result Date: 01/22/2023 CLINICAL DATA:  Screening. EXAM: DIGITAL SCREENING BILATERAL MAMMOGRAM WITH TOMOSYNTHESIS AND CAD TECHNIQUE: Bilateral screening digital craniocaudal and mediolateral oblique mammograms were obtained. Bilateral screening digital breast tomosynthesis was performed. The images were evaluated with computer-aided detection. COMPARISON:  Previous exam(s). ACR Breast Density Category b: There are scattered areas of fibroglandular density. FINDINGS: There are no findings suspicious for malignancy. IMPRESSION: No mammographic evidence of malignancy. A result letter of this screening mammogram will be mailed directly to the patient. RECOMMENDATION: Screening mammogram in one year. (Code:SM-B-01Y) BI-RADS CATEGORY  1: Negative. Electronically Signed   By: Bary Richard M.D.   On: 01/22/2023 11:01   CT CHEST ABDOMEN PELVIS W CONTRAST  Result Date: 12/26/2022 CLINICAL DATA:  Metastatic rectal adenocarcinoma; * Tracking Code: BO * EXAM: CT CHEST, ABDOMEN, AND PELVIS WITH CONTRAST TECHNIQUE: Multidetector CT imaging of the chest, abdomen and pelvis was performed following the standard protocol during bolus administration of intravenous contrast. RADIATION DOSE REDUCTION: This exam was performed according to the departmental dose-optimization program which includes automated exposure control, adjustment of the mA and/or kV  according to patient size and/or use of iterative reconstruction technique. CONTRAST:  OMNIPAQUE IOHEXOL 300 MG/ML  SOLN COMPARISON:  PET-CT dated September 19, 2022 FINDINGS: CT CHEST FINDINGS Cardiovascular: Normal heart size. No pericardial effusion. Normal caliber thoracic aorta mild atherosclerotic disease. Right chest wall port with tip in the right atrium. Mild coronary artery calcifications. Mediastinum/Nodes: Esophagus and thyroid are unremarkable. No pathologically enlarged lymph nodes seen in the chest. Lungs/Pleura: Central airways are patent. Scattered small solid pulmonary nodules stable. Reference irregular solid pulmonary nodule of the right middle lobe measuring 10 x 7 mm on series 4, image 102, unchanged when compared with the prior exam and remeasured in similar plane. Musculoskeletal: Osseous lesion of the proximal right humerus demonstrates increased sclerosis when compared with the prior PET-CT possibly due to treatment response. CT ABDOMEN PELVIS FINDINGS Hepatobiliary: No suspicious liver lesion. Mild intra and extrahepatic biliary ductal dilation, unchanged when compared with the prior exams. Pancreas: Unremarkable. No pancreatic ductal dilatation or surrounding inflammatory changes. Spleen: Normal in size without focal abnormality. Adrenals/Urinary Tract: Bilateral adrenal glands are unremarkable. No hydronephrosis or nephrolithiasis. Bilateral simple appearing renal cysts, no specific follow-up imaging is recommended for these lesions. No suspicious renal lesions. Stomach/Bowel: Postsurgical changes of prior APR with left lower quadrant colostomy. Normal appearance of the stomach. Normal appendix. No evidence of obstruction. Vascular/Lymphatic: Aortic atherosclerosis. Stable size of right inguinal and right pelvic lymph nodes. Reference right inguinal lymph node measuring 11 mm on series 2, image 105. Reference right inguinal lymph node measuring 6 mm on series 2, image  98, not  significantly changed in size. Reproductive: No adnexal mass. Other: No abdominopelvic ascites. Musculoskeletal: Osseous irregularity of the left iliac bone, unchanged when compared with prior exam. IMPRESSION: 1. Stable scattered solid pulmonary nodules. 2. Right inguinal and right pelvic lymph nodes which were hypermetabolic on prior PET-CT are stable. 3. Osseous lesion of the proximal right humerus demonstrates increased sclerosis when compared with the prior PET-CT, possibly due to treatment response. 4. 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 5. Aortic Atherosclerosis (ICD10-I70.0). Electronically Signed   By: Allegra Lai M.D.   On: 12/26/2022 09:05

## 2023-01-30 NOTE — Assessment & Plan Note (Signed)
potassium is stable.  Continue potassium chloride 20 mEq  daily.  

## 2023-01-30 NOTE — Patient Instructions (Signed)
[place] [facility]  Discharge Instructions: Thank you for choosing [facility] to provide your oncology and hematology care.  If you have a lab appointment with the Cancer Center, please go directly to the Cancer Center and check in at the registration area.  Wear comfortable clothing and clothing appropriate for easy access to any Portacath or PICC line.   We strive to give you quality time with your provider. You may need to reschedule your appointment if you arrive late (15 or more minutes).  Arriving late affects you and other patients whose appointments are after yours.  Also, if you miss three or more appointments without notifying the office, you may be dismissed from the clinic at the provider's discretion.      For prescription refill requests, have your pharmacy contact our office and allow 72 hours for refills to be completed.    Today you received the following chemotherapy and/or immunotherapy agents Vectibix, Irinotecan      To help prevent nausea and vomiting after your treatment, we encourage you to take your nausea medication as directed.  BELOW ARE SYMPTOMS THAT SHOULD BE REPORTED IMMEDIATELY: *FEVER GREATER THAN 100.4 F (38 C) OR HIGHER *CHILLS OR SWEATING *NAUSEA AND VOMITING THAT IS NOT CONTROLLED WITH YOUR NAUSEA MEDICATION *UNUSUAL SHORTNESS OF BREATH *UNUSUAL BRUISING OR BLEEDING *URINARY PROBLEMS (pain or burning when urinating, or frequent urination) *BOWEL PROBLEMS (unusual diarrhea, constipation, pain near the anus) TENDERNESS IN MOUTH AND THROAT WITH OR WITHOUT PRESENCE OF ULCERS (sore throat, sores in mouth, or a toothache) UNUSUAL RASH, SWELLING OR PAIN  UNUSUAL VAGINAL DISCHARGE OR ITCHING   Items with * indicate a potential emergency and should be followed up as soon as possible or go to the Emergency Department if any problems should occur.  Please show the CHEMOTHERAPY ALERT CARD or IMMUNOTHERAPY ALERT CARD at  check-in to the Emergency Department and triage nurse.  Should you have questions after your visit or need to cancel or reschedule your appointment, please contact [place] [facility]  [phone] and follow the prompts.  Office hours are 8:00 a.m. to 4:30 p.m. Monday - Friday. Please note that voicemails left after 4:00 p.m. may not be returned until the following business day.  We are closed weekends and major holidays. You have access to a nurse at all times for urgent questions. Please call the main number to the clinic [phone] and follow the prompts.  For any non-urgent questions, you may also contact your provider using MyChart. We now offer e-Visits for anyone 2 and older to request care online for non-urgent symptoms. For details visit [email or web address].   Also download the MyChart app! Go to the app store, search "MyChart", open the app, select [place], and log in with your MyChart username and password.

## 2023-01-30 NOTE — Assessment & Plan Note (Signed)
Stable, continue lomotil  QID PRN as instructed.  

## 2023-01-30 NOTE — Assessment & Plan Note (Signed)
patient will receive long-acting GCSF prophylaxis on D3 

## 2023-01-31 NOTE — Telephone Encounter (Signed)
[facility] unable to accommodate pt due to staffing. Will send referral to [facility]

## 2023-02-01 ENCOUNTER — Ambulatory Visit: Payer: 59

## 2023-02-01 ENCOUNTER — Inpatient Hospital Stay: Payer: 59

## 2023-02-01 DIAGNOSIS — K219 Gastro-esophageal reflux disease without esophagitis: Secondary | ICD-10-CM | POA: Diagnosis not present

## 2023-02-01 DIAGNOSIS — Z5111 Encounter for antineoplastic chemotherapy: Secondary | ICD-10-CM | POA: Diagnosis not present

## 2023-02-01 DIAGNOSIS — I700 Atherosclerosis of aorta: Secondary | ICD-10-CM | POA: Diagnosis not present

## 2023-02-01 DIAGNOSIS — R197 Diarrhea, unspecified: Secondary | ICD-10-CM | POA: Diagnosis not present

## 2023-02-01 DIAGNOSIS — E876 Hypokalemia: Secondary | ICD-10-CM | POA: Diagnosis not present

## 2023-02-01 DIAGNOSIS — C799 Secondary malignant neoplasm of unspecified site: Secondary | ICD-10-CM

## 2023-02-01 DIAGNOSIS — B372 Candidiasis of skin and nail: Secondary | ICD-10-CM | POA: Diagnosis not present

## 2023-02-01 DIAGNOSIS — Z79899 Other long term (current) drug therapy: Secondary | ICD-10-CM | POA: Diagnosis not present

## 2023-02-01 DIAGNOSIS — I10 Essential (primary) hypertension: Secondary | ICD-10-CM | POA: Diagnosis not present

## 2023-02-01 DIAGNOSIS — Z86711 Personal history of pulmonary embolism: Secondary | ICD-10-CM | POA: Diagnosis not present

## 2023-02-01 DIAGNOSIS — E7800 Pure hypercholesterolemia, unspecified: Secondary | ICD-10-CM | POA: Diagnosis not present

## 2023-02-01 DIAGNOSIS — I2510 Atherosclerotic heart disease of native coronary artery without angina pectoris: Secondary | ICD-10-CM | POA: Diagnosis not present

## 2023-02-01 DIAGNOSIS — Z5189 Encounter for other specified aftercare: Secondary | ICD-10-CM | POA: Diagnosis not present

## 2023-02-01 DIAGNOSIS — Z7901 Long term (current) use of anticoagulants: Secondary | ICD-10-CM | POA: Diagnosis not present

## 2023-02-01 DIAGNOSIS — R634 Abnormal weight loss: Secondary | ICD-10-CM | POA: Diagnosis not present

## 2023-02-01 DIAGNOSIS — K435 Parastomal hernia without obstruction or  gangrene: Secondary | ICD-10-CM | POA: Diagnosis not present

## 2023-02-01 DIAGNOSIS — Z87891 Personal history of nicotine dependence: Secondary | ICD-10-CM | POA: Diagnosis not present

## 2023-02-01 DIAGNOSIS — Z933 Colostomy status: Secondary | ICD-10-CM | POA: Diagnosis not present

## 2023-02-01 DIAGNOSIS — C20 Malignant neoplasm of rectum: Secondary | ICD-10-CM | POA: Diagnosis not present

## 2023-02-01 DIAGNOSIS — Z86718 Personal history of other venous thrombosis and embolism: Secondary | ICD-10-CM | POA: Diagnosis not present

## 2023-02-01 DIAGNOSIS — R609 Edema, unspecified: Secondary | ICD-10-CM | POA: Diagnosis not present

## 2023-02-01 DIAGNOSIS — Z800 Family history of malignant neoplasm of digestive organs: Secondary | ICD-10-CM | POA: Diagnosis not present

## 2023-02-01 DIAGNOSIS — Z5112 Encounter for antineoplastic immunotherapy: Secondary | ICD-10-CM | POA: Diagnosis not present

## 2023-02-01 LAB — CEA: CEA: 593 ng/mL — ABNORMAL HIGH (ref 0.0–4.7)

## 2023-02-01 MED ORDER — PEGFILGRASTIM INJECTION 6 MG/0.6ML ~~LOC~~
6.0000 mg | PREFILLED_SYRINGE | Freq: Once | SUBCUTANEOUS | Status: AC
  Administered 2023-02-01: 6 mg via SUBCUTANEOUS
  Filled 2023-02-01: qty 0.6

## 2023-02-05 ENCOUNTER — Other Ambulatory Visit: Payer: Self-pay | Admitting: Oncology

## 2023-02-12 MED FILL — Dexamethasone Sodium Phosphate Inj 100 MG/10ML: INTRAMUSCULAR | Qty: 1 | Status: AC

## 2023-02-13 ENCOUNTER — Inpatient Hospital Stay (HOSPITAL_BASED_OUTPATIENT_CLINIC_OR_DEPARTMENT_OTHER): Payer: 59 | Admitting: Oncology

## 2023-02-13 ENCOUNTER — Encounter: Payer: Self-pay | Admitting: Oncology

## 2023-02-13 ENCOUNTER — Inpatient Hospital Stay: Payer: 59

## 2023-02-13 VITALS — BP 130/53 | HR 80 | Temp 97.3°F | Resp 18 | Wt 164.4 lb

## 2023-02-13 DIAGNOSIS — K435 Parastomal hernia without obstruction or  gangrene: Secondary | ICD-10-CM | POA: Diagnosis not present

## 2023-02-13 DIAGNOSIS — Z87891 Personal history of nicotine dependence: Secondary | ICD-10-CM | POA: Diagnosis not present

## 2023-02-13 DIAGNOSIS — D6481 Anemia due to antineoplastic chemotherapy: Secondary | ICD-10-CM

## 2023-02-13 DIAGNOSIS — Z5189 Encounter for other specified aftercare: Secondary | ICD-10-CM | POA: Diagnosis not present

## 2023-02-13 DIAGNOSIS — K219 Gastro-esophageal reflux disease without esophagitis: Secondary | ICD-10-CM | POA: Diagnosis not present

## 2023-02-13 DIAGNOSIS — B372 Candidiasis of skin and nail: Secondary | ICD-10-CM | POA: Diagnosis not present

## 2023-02-13 DIAGNOSIS — R634 Abnormal weight loss: Secondary | ICD-10-CM | POA: Diagnosis not present

## 2023-02-13 DIAGNOSIS — Z86711 Personal history of pulmonary embolism: Secondary | ICD-10-CM

## 2023-02-13 DIAGNOSIS — Z86718 Personal history of other venous thrombosis and embolism: Secondary | ICD-10-CM | POA: Diagnosis not present

## 2023-02-13 DIAGNOSIS — I2510 Atherosclerotic heart disease of native coronary artery without angina pectoris: Secondary | ICD-10-CM | POA: Diagnosis not present

## 2023-02-13 DIAGNOSIS — E7800 Pure hypercholesterolemia, unspecified: Secondary | ICD-10-CM | POA: Diagnosis not present

## 2023-02-13 DIAGNOSIS — C20 Malignant neoplasm of rectum: Secondary | ICD-10-CM

## 2023-02-13 DIAGNOSIS — I700 Atherosclerosis of aorta: Secondary | ICD-10-CM | POA: Diagnosis not present

## 2023-02-13 DIAGNOSIS — K521 Toxic gastroenteritis and colitis: Secondary | ICD-10-CM

## 2023-02-13 DIAGNOSIS — E876 Hypokalemia: Secondary | ICD-10-CM

## 2023-02-13 DIAGNOSIS — R197 Diarrhea, unspecified: Secondary | ICD-10-CM | POA: Diagnosis not present

## 2023-02-13 DIAGNOSIS — Z800 Family history of malignant neoplasm of digestive organs: Secondary | ICD-10-CM | POA: Diagnosis not present

## 2023-02-13 DIAGNOSIS — C799 Secondary malignant neoplasm of unspecified site: Secondary | ICD-10-CM

## 2023-02-13 DIAGNOSIS — Z5111 Encounter for antineoplastic chemotherapy: Secondary | ICD-10-CM | POA: Diagnosis not present

## 2023-02-13 DIAGNOSIS — Z933 Colostomy status: Secondary | ICD-10-CM | POA: Diagnosis not present

## 2023-02-13 DIAGNOSIS — D701 Agranulocytosis secondary to cancer chemotherapy: Secondary | ICD-10-CM | POA: Diagnosis not present

## 2023-02-13 DIAGNOSIS — Z7901 Long term (current) use of anticoagulants: Secondary | ICD-10-CM | POA: Diagnosis not present

## 2023-02-13 DIAGNOSIS — Z5112 Encounter for antineoplastic immunotherapy: Secondary | ICD-10-CM | POA: Diagnosis not present

## 2023-02-13 DIAGNOSIS — Z79899 Other long term (current) drug therapy: Secondary | ICD-10-CM | POA: Diagnosis not present

## 2023-02-13 DIAGNOSIS — T451X5A Adverse effect of antineoplastic and immunosuppressive drugs, initial encounter: Secondary | ICD-10-CM | POA: Diagnosis not present

## 2023-02-13 DIAGNOSIS — I10 Essential (primary) hypertension: Secondary | ICD-10-CM | POA: Diagnosis not present

## 2023-02-13 DIAGNOSIS — R609 Edema, unspecified: Secondary | ICD-10-CM | POA: Diagnosis not present

## 2023-02-13 LAB — COMPREHENSIVE METABOLIC PANEL
ALT: 19 U/L (ref 0–44)
AST: 31 U/L (ref 15–41)
Albumin: 3.5 g/dL (ref 3.5–5.0)
Alkaline Phosphatase: 68 U/L (ref 38–126)
Anion gap: 8 (ref 5–15)
BUN: 14 mg/dL (ref 8–23)
CO2: 25 mmol/L (ref 22–32)
Calcium: 8.6 mg/dL — ABNORMAL LOW (ref 8.9–10.3)
Chloride: 104 mmol/L (ref 98–111)
Creatinine, Ser: 0.66 mg/dL (ref 0.44–1.00)
GFR, Estimated: >60 mL/min (ref >60)
Glucose, Bld: 97 mg/dL (ref 70–99)
Potassium: 3.6 mmol/L (ref 3.5–5.1)
Sodium: 137 mmol/L (ref 135–145)
Total Bilirubin: 0.4 mg/dL (ref 0.3–1.2)
Total Protein: 6.3 g/dL — ABNORMAL LOW (ref 6.5–8.1)

## 2023-02-13 LAB — CBC WITH DIFFERENTIAL/PLATELET
Abs Immature Granulocytes: 0.03 10*3/uL (ref 0.00–0.07)
Basophils Absolute: 0 10*3/uL (ref 0.0–0.1)
Basophils Relative: 0 %
Eosinophils Absolute: 0.2 10*3/uL (ref 0.0–0.5)
Eosinophils Relative: 3 %
HCT: 34.9 % — ABNORMAL LOW (ref 36.0–46.0)
Hemoglobin: 10.8 g/dL — ABNORMAL LOW (ref 12.0–15.0)
Immature Granulocytes: 1 %
Lymphocytes Relative: 19 %
Lymphs Abs: 1 10*3/uL (ref 0.7–4.0)
MCH: 29.8 pg (ref 26.0–34.0)
MCHC: 30.9 g/dL (ref 30.0–36.0)
MCV: 96.1 fL (ref 80.0–100.0)
Monocytes Absolute: 0.4 10*3/uL (ref 0.1–1.0)
Monocytes Relative: 7 %
Neutro Abs: 3.6 10*3/uL (ref 1.7–7.7)
Neutrophils Relative %: 70 %
Platelets: 176 10*3/uL (ref 150–400)
RBC: 3.63 MIL/uL — ABNORMAL LOW (ref 3.87–5.11)
RDW: 17.3 % — ABNORMAL HIGH (ref 11.5–15.5)
WBC: 5.3 10*3/uL (ref 4.0–10.5)
nRBC: 0 % (ref 0.0–0.2)

## 2023-02-13 LAB — MAGNESIUM: Magnesium: 1.3 mg/dL — ABNORMAL LOW (ref 1.7–2.4)

## 2023-02-13 MED ORDER — SODIUM CHLORIDE 0.9 % IV SOLN
10.0000 mg | Freq: Once | INTRAVENOUS | Status: AC
  Administered 2023-02-13: 10 mg via INTRAVENOUS
  Filled 2023-02-13: qty 10

## 2023-02-13 MED ORDER — MAGNESIUM SULFATE 4 GM/100ML IV SOLN
4.0000 g | Freq: Once | INTRAVENOUS | Status: AC
  Administered 2023-02-13: 4 g via INTRAVENOUS
  Filled 2023-02-13: qty 100

## 2023-02-13 MED ORDER — SODIUM CHLORIDE 0.9% FLUSH
10.0000 mL | Freq: Once | INTRAVENOUS | Status: AC
  Administered 2023-02-13: 10 mL via INTRAVENOUS
  Filled 2023-02-13: qty 10

## 2023-02-13 MED ORDER — ATROPINE SULFATE 1 MG/ML IV SOLN
0.5000 mg | Freq: Once | INTRAVENOUS | Status: AC
  Administered 2023-02-13: 0.5 mg via INTRAVENOUS
  Filled 2023-02-13: qty 1

## 2023-02-13 MED ORDER — HEPARIN SOD (PORK) LOCK FLUSH 100 UNIT/ML IV SOLN
500.0000 [IU] | Freq: Once | INTRAVENOUS | Status: AC | PRN
  Administered 2023-02-13: 500 [IU]
  Filled 2023-02-13: qty 5

## 2023-02-13 MED ORDER — SODIUM CHLORIDE 0.9 % IV SOLN
6.0000 mg/kg | Freq: Once | INTRAVENOUS | Status: AC
  Administered 2023-02-13: 400 mg via INTRAVENOUS
  Filled 2023-02-13: qty 20

## 2023-02-13 MED ORDER — SODIUM CHLORIDE 0.9 % IV SOLN
100.0000 mg/m2 | Freq: Once | INTRAVENOUS | Status: AC
  Administered 2023-02-13: 180 mg via INTRAVENOUS
  Filled 2023-02-13: qty 4

## 2023-02-13 MED ORDER — PALONOSETRON HCL INJECTION 0.25 MG/5ML
0.2500 mg | Freq: Once | INTRAVENOUS | Status: AC
  Administered 2023-02-13: 0.25 mg via INTRAVENOUS
  Filled 2023-02-13: qty 5

## 2023-02-13 MED ORDER — SODIUM CHLORIDE 0.9 % IV SOLN
Freq: Once | INTRAVENOUS | Status: AC
  Filled 2023-02-13: qty 250

## 2023-02-13 MED ORDER — LOPERAMIDE HCL 2 MG PO CAPS
4.0000 mg | ORAL_CAPSULE | Freq: Once | ORAL | Status: AC
  Administered 2023-02-13: 4 mg via ORAL
  Filled 2023-02-13: qty 2

## 2023-02-13 MED ORDER — DIPHENHYDRAMINE HCL 50 MG/ML IJ SOLN
25.0000 mg | Freq: Once | INTRAMUSCULAR | Status: AC
  Administered 2023-02-13: 25 mg via INTRAVENOUS
  Filled 2023-02-13: qty 1

## 2023-02-13 NOTE — Assessment & Plan Note (Signed)
Continue  Eliquis 2.5 mg twice daily for anticoagulation prophylaxis. 

## 2023-02-13 NOTE — Progress Notes (Signed)
Weight change affecting vectibix dose.  Only have 400mg  in inventory today so will keep dose as is and increase based on weight for future doses - ok per MD

## 2023-02-13 NOTE — Assessment & Plan Note (Signed)
Chemotherapy plan as listed above 

## 2023-02-13 NOTE — Assessment & Plan Note (Signed)
potassium is stable.  Continue potassium chloride 20 mEq  daily.  

## 2023-02-13 NOTE — Patient Instructions (Signed)
[place] [facility]  Discharge Instructions: Thank you for choosing [facility] to provide your oncology and hematology care.  If you have a lab appointment with the Cancer Center, please go directly to the Cancer Center and check in at the registration area.  Wear comfortable clothing and clothing appropriate for easy access to any Portacath or PICC line.   We strive to give you quality time with your provider. You may need to reschedule your appointment if you arrive late (15 or more minutes).  Arriving late affects you and other patients whose appointments are after yours.  Also, if you miss three or more appointments without notifying the office, you may be dismissed from the clinic at the provider's discretion.      For prescription refill requests, have your pharmacy contact our office and allow 72 hours for refills to be completed.    Today you received the following chemotherapy and/or immunotherapy agents VECTIBEX, IRINOTECAN, MAGNESIUM      To help prevent nausea and vomiting after your treatment, we encourage you to take your nausea medication as directed.  BELOW ARE SYMPTOMS THAT SHOULD BE REPORTED IMMEDIATELY: *FEVER GREATER THAN 100.4 F (38 C) OR HIGHER *CHILLS OR SWEATING *NAUSEA AND VOMITING THAT IS NOT CONTROLLED WITH YOUR NAUSEA MEDICATION *UNUSUAL SHORTNESS OF BREATH *UNUSUAL BRUISING OR BLEEDING *URINARY PROBLEMS (pain or burning when urinating, or frequent urination) *BOWEL PROBLEMS (unusual diarrhea, constipation, pain near the anus) TENDERNESS IN MOUTH AND THROAT WITH OR WITHOUT PRESENCE OF ULCERS (sore throat, sores in mouth, or a toothache) UNUSUAL RASH, SWELLING OR PAIN  UNUSUAL VAGINAL DISCHARGE OR ITCHING   Items with * indicate a potential emergency and should be followed up as soon as possible or go to the Emergency Department if any problems should occur.  Please show the CHEMOTHERAPY ALERT CARD or IMMUNOTHERAPY  ALERT CARD at check-in to the Emergency Department and triage nurse.  Should you have questions after your visit or need to cancel or reschedule your appointment, please contact [place] [facility]  [phone] and follow the prompts.  Office hours are 8:00 a.m. to 4:30 p.m. Monday - Friday. Please note that voicemails left after 4:00 p.m. may not be returned until the following business day.  We are closed weekends and major holidays. You have access to a nurse at all times for urgent questions. Please call the main number to the clinic [phone] and follow the prompts.  For any non-urgent questions, you may also contact your provider using MyChart. We now offer e-Visits for anyone 27 and older to request care online for non-urgent symptoms. For details visit [email or web address].   Also download the MyChart app! Go to the app store, search "MyChart", open the app, select [place], and log in with your MyChart username and password.  Panitumumab Injection What is this medication? PANITUMUMAB (pan i TOOM ue mab) treats colorectal cancer. It works by blocking a protein that causes cancer cells to grow and multiply. This helps to slow or stop the spread of cancer cells. It is a monoclonal antibody. This medicine may be used for other purposes; ask your health care provider or pharmacist if you have questions. COMMON BRAND NAME(S): Vectibix What should I tell my care team before I take this medication? They need to know if you have any of these conditions: Eye disease Low levels of magnesium in the blood Lung disease An unusual or allergic reaction to panitumumab, other medications, foods,  dyes, or preservatives Pregnant or trying to get pregnant Breast-feeding How should I use this medication? This medication is injected into a vein. It is given by your care team in a hospital or clinic setting. Talk to your care team about the use of this medication in  children. Special care may be needed. Overdosage: If you think you have taken too much of this medicine contact a poison control center or emergency room at once. NOTE: This medicine is only for you. Do not share this medicine with others. What if I miss a dose? Keep appointments for follow-up doses. It is important not to miss your dose. Call your care team if you are unable to keep an appointment. What may interact with this medication? Bevacizumab This list may not describe all possible interactions. Give your health care provider a list of all the medicines, herbs, non-prescription drugs, or dietary supplements you use. Also tell them if you smoke, drink alcohol, or use illegal drugs. Some items may interact with your medicine. What should I watch for while using this medication? Your condition will be monitored carefully while you are receiving this medication. This medication may make you feel generally unwell. This is not uncommon as chemotherapy can affect healthy cells as well as cancer cells. Report any side effects. Continue your course of treatment even though you feel ill unless your care team tells you to stop. This medication can make you more sensitive to the sun. Keep out of the sun while receiving this medication and for 2 months after stopping therapy. If you cannot avoid being in the sun, wear protective clothing and sunscreen. Do not use sun lamps, tanning beds, or tanning booths. Check with your care team if you have severe diarrhea, nausea, and vomiting or if you sweat a lot. The loss of too much body fluid may make it dangerous for you to take this medication. This medication may cause serious skin reactions. They can happen weeks to months after starting the medication. Contact your care team right away if you notice fevers or flu-like symptoms with a rash. The rash may be red or purple and then turn into blisters or peeling of the skin. You may also notice a red rash with  swelling of the face, lips, or lymph nodes in your neck or under your arms. Talk to your care team if you may be pregnant. Serious birth defects can occur if you take this medication during pregnancy and for 2 months after the last dose. Contraception is recommended while taking this medication and for 2 months after the last dose. Your care team can help you find the option that works for you. Do not breastfeed while taking this medication and for 2 months after the last dose. This medication may cause infertility. Talk to your care team if you are concerned about your fertility. What side effects may I notice from receiving this medication? Side effects that you should report to your care team as soon as possible: Allergic reactions--skin rash, itching, hives, swelling of the face, lips, tongue, or throat Dry cough, shortness of breath or trouble breathing Eye pain, redness, irritation, or discharge with blurry or decreased vision Infusion reactions--chest pain, shortness of breath or trouble breathing, feeling faint or lightheaded Low magnesium level--muscle pain or cramps, unusual weakness or fatigue, fast or irregular heartbeat, tremors Low potassium level--muscle pain or cramps, unusual weakness or fatigue, fast or irregular heartbeat, constipation Redness, blistering, peeling, or loosening of the skin, including inside the  mouth Skin reactions on sun-exposed areas Side effects that usually do not require medical attention (report to your care team if they continue or are bothersome): Change in nail shape, thickness, or color Diarrhea Dry skin Fatigue Nausea Vomiting This list may not describe all possible side effects. Call your doctor for medical advice about side effects. You may report side effects to FDA at 1-800-FDA-1088. Where should I keep my medication? This medication is given in a hospital or clinic. It will not be stored at home. NOTE: This sheet is a summary. It may not  cover all possible information. If you have questions about this medicine, talk to your doctor, pharmacist, or health care provider.  2023 Elsevier/Gold Standard (2022-02-13 00:00:00)   Irinotecan Injection What is this medication? IRINOTECAN (ir in oh TEE kan) treats some types of cancer. It works by slowing down the growth of cancer cells. This medicine may be used for other purposes; ask your health care provider or pharmacist if you have questions. COMMON BRAND NAME(S): Camptosar What should I tell my care team before I take this medication? They need to know if you have any of these conditions: Dehydration Diarrhea Infection, especially a viral infection, such as chickenpox, cold sores, herpes Liver disease Low blood cell levels (white cells, red cells, and platelets) Low levels of electrolytes, such as calcium, magnesium, or potassium in your blood Recent or ongoing radiation An unusual or allergic reaction to irinotecan, other medications, foods, dyes, or preservatives If you or your partner are pregnant or trying to get pregnant Breast-feeding How should I use this medication? This medication is injected into a vein. It is given by your care team in a hospital or clinic setting. Talk to your care team about the use of this medication in children. Special care may be needed. Overdosage: If you think you have taken too much of this medicine contact a poison control center or emergency room at once. NOTE: This medicine is only for you. Do not share this medicine with others. What if I miss a dose? Keep appointments for follow-up doses. It is important not to miss your dose. Call your care team if you are unable to keep an appointment. What may interact with this medication? Do not take this medication with any of the following: Cobicistat Itraconazole This medication may also interact with the following: Certain antibiotics, such as clarithromycin, rifampin, rifabutin Certain  antivirals for HIV or AIDS Certain medications for fungal infections, such as ketoconazole, posaconazole, voriconazole Certain medications for seizures, such as carbamazepine, phenobarbital, phenytoin Gemfibrozil Nefazodone St. John's wort This list may not describe all possible interactions. Give your health care provider a list of all the medicines, herbs, non-prescription drugs, or dietary supplements you use. Also tell them if you smoke, drink alcohol, or use illegal drugs. Some items may interact with your medicine. What should I watch for while using this medication? Your condition will be monitored carefully while you are receiving this medication. You may need blood work while taking this medication. This medication may make you feel generally unwell. This is not uncommon as chemotherapy can affect healthy cells as well as cancer cells. Report any side effects. Continue your course of treatment even though you feel ill unless your care team tells you to stop. This medication can cause serious side effects. To reduce the risk, your care team may give you other medications to take before receiving this one. Be sure to follow the directions from your care team. This medication  may affect your coordination, reaction time, or judgement. Do not drive or operate machinery until you know how this medication affects you. Sit up or stand slowly to reduce the risk of dizzy or fainting spells. Drinking alcohol with this medication can increase the risk of these side effects. This medication may increase your risk of getting an infection. Call your care team for advice if you get a fever, chills, sore throat, or other symptoms of a cold or flu. Do not treat yourself. Try to avoid being around people who are sick. Avoid taking medications that contain aspirin, acetaminophen, ibuprofen, naproxen, or ketoprofen unless instructed by your care team. These medications may hide a fever. This medication may  increase your risk to bruise or bleed. Call your care team if you notice any unusual bleeding. Be careful brushing or flossing your teeth or using a toothpick because you may get an infection or bleed more easily. If you have any dental work done, tell your dentist you are receiving this medication. Talk to your care team if you or your partner are pregnant or think either of you might be pregnant. This medication can cause serious birth defects if taken during pregnancy and for 6 months after the last dose. You will need a negative pregnancy test before starting this medication. Contraception is recommended while taking this medication and for 6 months after the last dose. Your care team can help you find the option that works for you. Do not father a child while taking this medication and for 3 months after the last dose. Use a condom for contraception during this time period. Do not breastfeed while taking this medication and for 7 days after the last dose. This medication may cause infertility. Talk to your care team if you are concerned about your fertility. What side effects may I notice from receiving this medication? Side effects that you should report to your care team as soon as possible: Allergic reactions--skin rash, itching, hives, swelling of the face, lips, tongue, or throat Dry cough, shortness of breath or trouble breathing Increased saliva or tears, increased sweating, stomach cramping, diarrhea, small pupils, unusual weakness or fatigue, slow heartbeat Infection--fever, chills, cough, sore throat, wounds that don't heal, pain or trouble when passing urine, general feeling of discomfort or being unwell Kidney injury--decrease in the amount of urine, swelling of the ankles, hands, or feet Low red blood cell level--unusual weakness or fatigue, dizziness, headache, trouble breathing Severe or prolonged diarrhea Unusual bruising or bleeding Side effects that usually do not require  medical attention (report to your care team if they continue or are bothersome): Constipation Diarrhea Hair loss Loss of appetite Nausea Stomach pain This list may not describe all possible side effects. Call your doctor for medical advice about side effects. You may report side effects to FDA at 1-800-FDA-1088. Where should I keep my medication? This medication is given in a hospital or clinic. It will not be stored at home. NOTE: This sheet is a summary. It may not cover all possible information. If you have questions about this medicine, talk to your doctor, pharmacist, or health care provider.  2023 Elsevier/Gold Standard (2022-02-09 00:00:00)   Magnesium Sulfate Injection What is this medication? MAGNESIUM SULFATE (mag NEE zee um SUL fate) prevents and treats low levels of magnesium in your body. It may also be used to prevent and treat seizures during pregnancy in people with high blood pressure disorders, such as preeclampsia or eclampsia. Magnesium plays an important role in  maintaining the health of your muscles and nervous system. This medicine may be used for other purposes; ask your health care provider or pharmacist if you have questions. What should I tell my care team before I take this medication? They need to know if you have any of these conditions: Heart disease History of irregular heart beat Kidney disease An unusual or allergic reaction to magnesium sulfate, medications, foods, dyes, or preservatives Pregnant or trying to get pregnant Breast-feeding How should I use this medication? This medication is for infusion into a vein. It is given in a hospital or clinic setting. Talk to your care team about the use of this medication in children. While this medication may be prescribed for selected conditions, precautions do apply. Overdosage: If you think you have taken too much of this medicine contact a poison control center or emergency room at once. NOTE: This  medicine is only for you. Do not share this medicine with others. What if I miss a dose? This does not apply. What may interact with this medication? Certain medications for anxiety or sleep Certain medications for seizures, such phenobarbital Digoxin Medications that relax muscles for surgery Narcotic medications for pain This list may not describe all possible interactions. Give your health care provider a list of all the medicines, herbs, non-prescription drugs, or dietary supplements you use. Also tell them if you smoke, drink alcohol, or use illegal drugs. Some items may interact with your medicine. What should I watch for while using this medication? Your condition will be monitored carefully while you are receiving this medication. You may need blood work done while you are receiving this medication. What side effects may I notice from receiving this medication? Side effects that you should report to your care team as soon as possible: Allergic reactions--skin rash, itching, hives, swelling of the face, lips, tongue, or throat High magnesium level--confusion, drowsiness, facial flushing, redness, sweating, muscle weakness, fast or irregular heartbeat, trouble breathing Low blood pressure--dizziness, feeling faint or lightheaded, blurry vision Side effects that usually do not require medical attention (report to your care team if they continue or are bothersome): Headache Nausea This list may not describe all possible side effects. Call your doctor for medical advice about side effects. You may report side effects to FDA at 1-800-FDA-1088. Where should I keep my medication? This medication is given in a hospital or clinic and will not be stored at home. NOTE: This sheet is a summary. It may not cover all possible information. If you have questions about this medicine, talk to your doctor, pharmacist, or health care provider.  2023 Elsevier/Gold Standard (2013-02-07 00:00:00)

## 2023-02-13 NOTE — Assessment & Plan Note (Signed)
Hb decreased, as expected on chemotherapy.  observation 

## 2023-02-13 NOTE — Assessment & Plan Note (Signed)
patient will receive long-acting GCSF prophylaxis on D3 

## 2023-02-13 NOTE — Assessment & Plan Note (Signed)
Stable, continue lomotil  QID PRN as instructed.  

## 2023-02-13 NOTE — Assessment & Plan Note (Signed)
History of stage IIIC Rectal cancer, s/p TNT, followed by 09/17/19 APR/posterior vaginectomy/TAH/BSO/VY-flap, pT4b pN0 with close vaginal margin 0.2 mm.  Uterus and ovaries negative for malignancy. palliative radiation to vaginal recurrence- 01/19/21 recurrence with lung metastasis.-Palliative -FOLFIRI plus bevacizumab.  Irinotecan was dropped in November 2022 due to side effects. Negative for UGT1A1*28 - radiographically stable, rise of CEA-July 2023 CT lung metastasis worse--> Dec 2023 PET showed progression in pelvic lymph nodes and bone lesions,--> 2nd line irinotecan +panitumumab-->CT March 2024 stable.  Labs are reviewed and discussed with patient Proceed with irinotecan +panitumumab

## 2023-02-13 NOTE — Progress Notes (Signed)
Hematology/Oncology Progress note [phone] [phone]      CHIEF COMPLAINTS/REASON FOR VISIT:  Follow up for rectal cancer  ASSESSMENT & PLAN:   Cancer Staging  Rectal cancer [facility]) Staging form: Colon and Rectum, AJCC 8th Edition - Pathologic stage from 10/06/2019: Stage IIC (ypT4b, pN0, cM0) - Signed by [name], MD on 10/06/2019 - Clinical stage from 06/30/2020: Jacqueline Yoder - Signed by [name], MD on 04/17/2022 - Pathologic: Stage Unknown (rpTX, pNX, cM1) - Signed by [name], MD on 01/31/2021   Rectal cancer [facility]) History of stage IIIC Rectal cancer, s/p TNT, followed by 09/17/19 APR/posterior vaginectomy/TAH/BSO/VY-flap, pT4b pN0 with close vaginal margin 0.2 mm.  Uterus and ovaries negative for malignancy. palliative radiation to vaginal recurrence- 01/19/21 recurrence with lung metastasis.-Palliative -FOLFIRI plus bevacizumab.  Irinotecan was dropped in November 2022 due to side effects. Negative for UGT1A1*28 - radiographically stable, rise of CEA-July 2023 CT lung metastasis worse--> Dec 2023 PET showed progression in pelvic lymph nodes and bone lesions,--> 2nd line irinotecan +panitumumab-->CT March 2024 stable.  Labs are reviewed and discussed with patient Proceed with irinotecan +panitumumab   Hypomagnesemia IV Mag 2g PRN Mag <=1.6.  Continue slow mag orally 64mg  twice daily.   Encounter for antineoplastic chemotherapy Chemotherapy plan as listed above.   Anemia due to antineoplastic chemotherapy Hb decreased, as expected on chemotherapy.  observation  Chemotherapy induced neutropenia (HCC) patient will receive long-acting GCSF prophylaxis on D3  History of pulmonary embolism Continue  Eliquis 2.5 mg twice daily for anticoagulation prophylaxis.  Chemotherapy induced diarrhea Stable, continue lomotil  QID PRN as instructed.   Hypokalemia  potassium is stable.  Continue potassium chloride 20 mEq  daily.    Follow up 2 weeks Lab MD  irinotecan + panitumumab All questions were answered. The patient knows to call the clinic with any problems, questions or concerns.  [name], MD, PhD [facility] Hematology Oncology 02/13/2023      HISTORY OF PRESENTING ILLNESS:  Patient initially presented with complaints of postmenopausal bleeding on 08/16/2018.  History of was menopausal vaginal bleeding in 2016 which resulted in cervical polypectomy.  Pathology 02/04/2015 showed cervical polyp, consistent with benign endometrial polyp.  [name] after polypectomy due to anxiety associated with pelvic exams.  pelvic exam on 08/16/2018 reviewed cervical abnormality and from enlarged uterus. Seen by Dr. [name] on 10/29/2018.  Endometrial biopsy and a Pap smear was performed. 10/29/2018 Pap smear showed adenocarcinoma, favor endometrial origin. 10/29/2018 endometrial biopsy showed endometrioid carcinoma, FIGO grade 1.  10/29/2018- TA & TV Ultrasound revealed: Anteverted uterus measuring 8.7 x 5.6 x 6.4 cm without evidence of focal masses.  The endometrium measuring 24.1 mm (thickened) and heterogeneous.  Right and left ovaries not visualized.  No adnexal masses identified.  No free fluid in cul-de-sac.  Patient was seen by Dr. [name] in clinic on 11/13/2018.  Cervical exam reveals 2 cm exophytic irregular mass consistent with malignancy.   11/19/2018 CT chest abdomen pelvis with contrast showed thickened endometrium with some irregularity compatible with the provided diagnosis of endometrial malignancy.  There is a mildly prominent left inguinal node 1.4 cm.  Patient was seen by Dr. [name] on 11/20/2018 and left groin lymph node biopsy was recommended.  11/26/2018 patient underwent left inguinal lymph node biopsy. Pathology showed metastatic adenocarcinoma consistent with colorectal origin.  CDX 2+.  Case was discussed on tumor board.  Recommend colonoscopy for further evaluation.  Patient reports significant weight loss 30 pounds  over the last  year.  Chronic vaginal spotting. Change of bowel habits the past few months.  More constipated.  Family history positive for brother who has colon cancer prostate cancer.  patient has underwent colonoscopy on 12/03/2018 which reviewed a nonobstructing large mass in the rectum.  Also chronic fistula.  Mass was not circumferential.  This was biopsied with a cold forceps for histology.  Pathology came back hyperplastic polyp negative for dysplasia and malignancy. [name], patient underwent flex sigmoidoscopy on 12/06/2018 with rebiopsy of the rectal mass. This time biopsy results came back positive for invasive colorectal adenocarcinoma, moderately differentiated. Immunotherapy for nearly mismatch repair protein (MMR ) was performed.  There is no loss of MMR expression.  low probability of MSI high.   # Seen by [name] for evaluation of resectability for rectal cancer. In addition, she also had a second opinion with [facility] pathology where her endometrial biopsy pathology was changed to  adenocarcinoma, consistent with colorectal primary.   [name]. She has home health that has been assisting with ostomy care  Patient was also evaluated by [facility] oncology.  Recommendation is to proceed with TNT with concurrent chemoradiation followed by neoadjuvant chemotherapy followed by surgical resection. Patient prefers to have treatment done locally with [facility].   # Oncology Treatment:  02/03/2019- 03/19/2019  concurrent Xeloda and radiation.  Xeloda dose 825mg  /m2 BID - rounded to 1650mg  BID- on days of radiation. 04/09/2019, started on FOLFOX with bolus early.  Omitted.  07/16/2019 finished 8 cycles of FOLFOX. 09/17/19 APR/posterior vaginectomy/TAH/BSO/VY-flap pT4b pN0 with close vaginal margin 0.2 mm.  Uterus and ovaries negative for malignancy. Patient reports bilateral lower extremity numbness and tingling, intermittent, left worse  than right. She has lost a lot of weight since her APR surgery.   #Family history with half brother having's history of colon cancer prostate cancer.  Personal history of colorectal cancer.  Patient has not decided if she wants genetic testing.    # history of PE( 01/13/2020)  in the bilateral lower extremity DVT (01/13/2020).   She finishes 6 months of anticoagulation with Eliquis 5 mg twice daily. Now switched to Eliquis 2.5 mg twice daily..  # She has now developed recurrent disease. #06/30/20  vaginal introitus mass biopsied. Pathology is consistent with metastatic colorectal adenocarcinoma I have discussed with [name]  Dr. Luciano Cutter and the mass is not resectable. Patient has also had colonoscopy by Dr. Tobi Bastos yesterday. Normal examination. # 07/16/2020 cycle 1 FOLFIRI  # 07/20/2020 PET scan was done for further evaluation, images are consistent with local recurrence, no distant metastasis. #Discussed with radiation oncology Dr. Rushie Chestnut will recommends concurrent chemotherapy and radiation. 08/02/2020-08/16/2020, patient starts radiation.  Xeloda was held due to neutropenia 08/17/2020,-09/06/2020 Xeloda 1500 mg twice daily concurrently with radiation  01/31/21 started on FOLFIRI + Bev 05/18/2021 CT chest abdomen pelvis showed Previously noted enlargement of bilateral inguinal lymph nodes is resolved, consistent with treatment response of nodal metastatic disease. Interval decrease in size of multiple small bilateral pulmonary nodules, consistent with treatment response of pulmonary metastatic disease. No evidence of new metastatic disease. 05/24/2021 - 08/30/2021, continued on FOLFIRI plus bevacizumab.  Irinotecan dose was reduced, eventually 100mg /m2  09/02/2021, CT chest abdomen pelvis without contrast Showed small bilateral pulmonary nodules, unchanged.  Stable metastatic disease.  No noncontrast evidence of new metastatic disease in the chest abdomen pelvis.  Small parastomal hernia.   Enlargement of main pulmonary artery.  Coronary artery disease.  09/13/2021, maintenance 5-FU/bevacizumab  11/28/2021, 5-FU/Irinotecan/bevacizumab.  Irinotecan 100 mg/m2 was added back due to progressively increasing CEA.    INTERVAL HISTORY Jacqueline Yoder is a 77 y.o. female who has above history reviewed by me presents for follow-up of rectal cancer. On Irinotecan and panitumumab. she tolerates well.  - chronic diarrhea is manageable.    Review of Systems  Constitutional:  Positive for fatigue. Negative for appetite change, chills, fever and unexpected weight change.  HENT:   Negative for hearing loss and voice change.   Eyes:  Negative for eye problems.  Respiratory:  Negative for chest tightness and cough.   Cardiovascular:  Negative for chest pain.  Gastrointestinal:  Negative for abdominal distention, abdominal pain, blood in stool, constipation, diarrhea and nausea.  Endocrine: Negative for hot flashes.  Genitourinary:  Negative for difficulty urinating and frequency.   Musculoskeletal:  Positive for arthralgias.  Skin:  Positive for rash. Negative for itching.  Neurological:  Negative for extremity weakness and numbness.  Hematological:  Negative for adenopathy.  Psychiatric/Behavioral:  Negative for confusion.     MEDICAL HISTORY:  Past Medical History:  Diagnosis Date   Allergy    Arthritis    Blood clot in vein    Family history of colon cancer    GERD (gastroesophageal reflux disease)    Hypercholesteremia    Hypertension    Hypertension    Lower extremity edema    Personal history of chemotherapy    Rectal cancer (HCC) 12/2018   Urinary incontinence     SURGICAL HISTORY: Past Surgical History:  Procedure Laterality Date   ABDOMINAL HYSTERECTOMY     CHOLECYSTECTOMY  1971   COLONOSCOPY WITH PROPOFOL N/A 12/03/2018   Procedure: COLONOSCOPY WITH PROPOFOL;  Surgeon: Midge Minium, MD;  Location: [facility] ENDOSCOPY;  Service: Endoscopy;  Laterality: N/A;    COLONOSCOPY WITH PROPOFOL N/A 07/15/2020   Procedure: COLONOSCOPY WITH PROPOFOL;  Surgeon: Wyline Mood, MD;  Location[facility] ENDOSCOPY;  Service: Gastroenterology;  Laterality: N/A;   FLEXIBLE SIGMOIDOSCOPY N/A 12/06/2018   Procedure: FLEXIBLE SIGMOIDOSCOPY;  Surgeon: Wyline Mood, MD;  Location: [facility] ENDOSCOPY;  Service: Endoscopy;  Laterality: N/A;   LAPAROSCOPIC COLOSTOMY  01/06/2019   PORTACATH PLACEMENT N/A 04/03/2019   Procedure: INSERTION PORT-A-CATH;  Surgeon: Leafy Ro, MD;  Location: [facility] ORS;  Service: General;  Laterality: N/A;    SOCIAL HISTORY: Social History   Socioeconomic History   Marital status: Widowed    Spouse name: Not on file   Number of children: Not on file   Years of education: Not on file   Highest education level: Not on file  Occupational History   Not on file  Tobacco Use   Smoking status: Former    Types: Cigarettes    Quit date: 12/02/1977    Years since quitting: 45.2   Smokeless tobacco: Former  Building services engineer Use: Never used  Substance and Sexual Activity   Alcohol use: Never   Drug use: Never   Sexual activity: Not Currently    Birth control/protection: None  Other Topics Concern   Not on file  Social History Narrative   Lives with daughter   Social Determinants of Health   Financial Resource Strain: Medium Risk (06/30/2022)   Overall Financial Resource Strain (CARDIA)    Difficulty of Paying Living Expenses: Somewhat hard  Food Insecurity: No Food Insecurity (06/30/2022)   Hunger Vital Sign    Worried About Running Out of Food in the Last Year: Never true    Ran  Out of Food in the Last Year: Never true  Transportation Needs: No Transportation Needs (06/30/2022)   PRAPARE - Administrator, Civil Service (Medical): No    Lack of Transportation (Non-Medical): No  Physical Activity: Inactive (06/30/2022)   Exercise Vital Sign    Days of Exercise per Week: 0 days    Minutes of Exercise per Session: 0 min  Stress: No  Stress Concern Present (06/30/2022)   [employer] of Occupational Health - Occupational Stress Questionnaire    Feeling of Stress : Not at all  Social Connections: Moderately Isolated (06/30/2022)   Social Connection and Isolation Panel [NHANES]    Frequency of Communication with Friends and Family: More than three times a week    Frequency of Social Gatherings with Friends and Family: Twice a week    Attends Religious Services: More than 4 times per year    Active Member of [employer] or Organizations: No    Attends Banker Meetings: Never    Marital Status: Widowed  Intimate Partner Violence: Not At Risk (06/30/2022)   Humiliation, Afraid, Rape, and Kick questionnaire    Fear of Current or Ex-Partner: No    Emotionally Abused: No    Physically Abused: No    Sexually Abused: No    FAMILY HISTORY: Family History  Problem Relation Age of Onset   Colon cancer Brother 51       exposure to chemicals [place]   Hypertension Mother    Stroke Mother    Kidney failure Father    Breast cancer Neg Hx    Ovarian cancer Neg Hx     ALLERGIES:  is allergic to sulfamethoxazole-trimethoprim.  MEDICATIONS:  Current Outpatient Medications  Medication Sig Dispense Refill   Cholecalciferol (VITAMIN D3) 2000 units capsule Take 2,000 Units by mouth daily.     cholestyramine (QUESTRAN) 4 g packet Take 1 packet (4 g total) by mouth 3 (three) times daily. 90 each 1   clindamycin (CLINDAGEL) 1 % gel Apply topically 2 (two) times daily. 30 g 5   diclofenac sodium (VOLTAREN) 1 % GEL Apply 2 g topically 4 (four) times daily as needed (joint pain).  11   diphenoxylate-atropine (LOMOTIL) 2.5-0.025 MG tablet TAKE 1 TABLET BY MOUTH 4 TIMES DAILY AS NEEDED FOR  DIARRHEA  OR  LOOSE  STOOLS 60 tablet 0   ELIQUIS 2.5 MG TABS tablet TAKE 1 TABLET BY MOUTH TWICE  DAILY 200 tablet 2   fluticasone (FLONASE) 50 MCG/ACT nasal spray USE 1 SPRAY IN EACH NOSTRIL ONCE DAILY 16 g 3   ketoconazole (NIZORAL) 2 %  cream Apply 1 Application topically daily. For up to 2 weeks. May repeat if need. 30 g 2   lidocaine-prilocaine (EMLA) cream Apply 1 application. topically as needed. 30 g 6   loperamide (IMODIUM) 2 MG capsule Take 1 capsule (2 mg total) by mouth See admin instructions. With onset of loose stool, take 4mg  followed by 2mg  every 2 hours,  Maximum: 16 mg/day 120 capsule 1   loratadine (CLARITIN) 10 MG tablet Take 10 mg by mouth daily.     magnesium chloride (SLOW-MAG) 64 MG TBEC SR tablet Take 1 tablet (64 mg total) by mouth 2 (two) times daily. 30 tablet 1   Multiple Vitamins-Minerals (ONE-A-DAY WOMENS 50 PLUS PO) Take 1 tablet by mouth daily.      nystatin (MYCOSTATIN/NYSTOP) powder Apply 1 Application topically 3 (three) times daily. 45 g 0   potassium chloride SA (KLOR-CON M) 20  MEQ tablet Take 1 tablet (20 mEq total) by mouth daily. 100 tablet 1   simvastatin (ZOCOR) 40 MG tablet Take 1 tablet (40 mg total) by mouth at bedtime. 100 tablet 3   triamcinolone cream (KENALOG) 0.5 % Apply 1 Application topically 2 (two) times daily. To affected areas, for up to 2 weeks. 30 g 2   triamterene-hydrochlorothiazide (DYAZIDE) 37.5-25 MG capsule Take 1 each (1 capsule total) by mouth daily. 100 capsule 3   zinc gluconate 50 MG tablet Take 50 mg by mouth daily.     No current facility-administered medications for this visit.     PHYSICAL EXAMINATION: ECOG PERFORMANCE STATUS: 1 - Symptomatic but completely ambulatory  Physical Exam Constitutional:      General: She is not in acute distress. HENT:     Head: Normocephalic and atraumatic.  Eyes:     General: No scleral icterus. Cardiovascular:     Rate and Rhythm: Normal rate.     Heart sounds: Normal heart sounds.  Pulmonary:     Effort: Pulmonary effort is normal. No respiratory distress.     Breath sounds: No wheezing.  Abdominal:     General: Bowel sounds are normal. There is no distension.     Palpations: Abdomen is soft.     Comments: +  Colostomy bag   Musculoskeletal:        General: No deformity. Normal range of motion.     Cervical back: Normal range of motion and neck supple.  Skin:    General: Skin is warm and dry.  Neurological:     Mental Status: She is alert and oriented to person, place, and time. Mental status is at baseline.     Cranial Nerves: No cranial nerve deficit.     Coordination: Coordination normal.       LABORATORY DATA:  I have reviewed the data as listed    Latest Ref Rng & Units 01/30/2023    8:15 AM 01/16/2023    7:55 AM 01/02/2023    8:42 AM  CBC  WBC 4.0 - 10.5 K/uL 3.4  6.8  5.7   Hemoglobin 12.0 - 15.0 g/dL 16.1  09.6  04.5   Hematocrit 36.0 - 46.0 % 34.4  34.7  36.5   Platelets 150 - 400 K/uL 243  228  224       Latest Ref Rng & Units 01/30/2023    8:15 AM 01/16/2023    7:55 AM 01/02/2023    8:42 AM  CMP  Glucose 70 - 99 mg/dL 93  97  97   BUN 8 - 23 mg/dL 14  14  16    Creatinine 0.44 - 1.00 mg/dL 4.09  8.11  9.14   Sodium 135 - 145 mmol/L 138  138  139   Potassium 3.5 - 5.1 mmol/L 3.5  3.6  3.7   Chloride 98 - 111 mmol/L 104  103  104   CO2 22 - 32 mmol/L 26  27  27    Calcium 8.9 - 10.3 mg/dL 8.7  8.8  8.9   Total Protein 6.5 - 8.1 g/dL 6.5  6.5  6.7   Total Bilirubin 0.3 - 1.2 mg/dL 0.5  0.5  0.6   Alkaline Phos 38 - 126 U/L 58  82  80   AST 15 - 41 U/L 37  38  32   ALT 0 - 44 U/L 23  28  23       RADIOGRAPHIC STUDIES: I have personally reviewed the radiological  images as listed and agreed with the findings in the report. MM 3D SCREEN BREAST BILATERAL  Result Date: 01/22/2023 CLINICAL DATA:  Screening. EXAM: DIGITAL SCREENING BILATERAL MAMMOGRAM WITH TOMOSYNTHESIS AND CAD TECHNIQUE: Bilateral screening digital craniocaudal and mediolateral oblique mammograms were obtained. Bilateral screening digital breast tomosynthesis was performed. The images were evaluated with computer-aided detection. COMPARISON:  Previous exam(s). ACR Breast Density Category b: There are scattered  areas of fibroglandular density. FINDINGS: There are no findings suspicious for malignancy. IMPRESSION: No mammographic evidence of malignancy. A result letter of this screening mammogram will be mailed directly to the patient. RECOMMENDATION: Screening mammogram in one year. (Code:SM-B-01Y) BI-RADS CATEGORY  1: Negative. Electronically Signed   By: Bary Richard M.D.   On: 01/22/2023 11:01   CT CHEST ABDOMEN PELVIS W CONTRAST  Result Date: 12/26/2022 CLINICAL DATA:  Metastatic rectal adenocarcinoma; * Tracking Code: BO * EXAM: CT CHEST, ABDOMEN, AND PELVIS WITH CONTRAST TECHNIQUE: Multidetector CT imaging of the chest, abdomen and pelvis was performed following the standard protocol during bolus administration of intravenous contrast. RADIATION DOSE REDUCTION: This exam was performed according to the departmental dose-optimization program which includes automated exposure control, adjustment of the mA and/or kV according to patient size and/or use of iterative reconstruction technique. CONTRAST:  OMNIPAQUE IOHEXOL 300 MG/ML  SOLN COMPARISON:  PET-CT dated September 19, 2022 FINDINGS: CT CHEST FINDINGS Cardiovascular: Normal heart size. No pericardial effusion. Normal caliber thoracic aorta mild atherosclerotic disease. Right chest wall port with tip in the right atrium. Mild coronary artery calcifications. Mediastinum/Nodes: Esophagus and thyroid are unremarkable. No pathologically enlarged lymph nodes seen in the chest. Lungs/Pleura: Central airways are patent. Scattered small solid pulmonary nodules stable. Reference irregular solid pulmonary nodule of the right middle lobe measuring 10 x 7 mm on series 4, image 102, unchanged when compared with the prior exam and remeasured in similar plane. Musculoskeletal: Osseous lesion of the proximal right humerus demonstrates increased sclerosis when compared with the prior PET-CT possibly due to treatment response. CT ABDOMEN PELVIS FINDINGS Hepatobiliary: No  suspicious liver lesion. Mild intra and extrahepatic biliary ductal dilation, unchanged when compared with the prior exams. Pancreas: Unremarkable. No pancreatic ductal dilatation or surrounding inflammatory changes. Spleen: Normal in size without focal abnormality. Adrenals/Urinary Tract: Bilateral adrenal glands are unremarkable. No hydronephrosis or nephrolithiasis. Bilateral simple appearing renal cysts, no specific follow-up imaging is recommended for these lesions. No suspicious renal lesions. Stomach/Bowel: Postsurgical changes of prior APR with left lower quadrant colostomy. Normal appearance of the stomach. Normal appendix. No evidence of obstruction. Vascular/Lymphatic: Aortic atherosclerosis. Stable size of right inguinal and right pelvic lymph nodes. Reference right inguinal lymph node measuring 11 mm on series 2, image 105. Reference right inguinal lymph node measuring 6 mm on series 2, image 98, not significantly changed in size. Reproductive: No adnexal mass. Other: No abdominopelvic ascites. Musculoskeletal: Osseous irregularity of the left iliac bone, unchanged when compared with prior exam. IMPRESSION: 1. Stable scattered solid pulmonary nodules. 2. Right inguinal and right pelvic lymph nodes which were hypermetabolic on prior PET-CT are stable. 3. Osseous lesion of the proximal right humerus demonstrates increased sclerosis when compared with the prior PET-CT, possibly due to treatment response. 4. 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 5. Aortic Atherosclerosis (ICD10-I70.0). Electronically Signed   By: Allegra Lai M.D.   On: 12/26/2022 09:05      MM 3D SCREEN BREAST BILATERAL  Result Date: 01/22/2023 CLINICAL DATA:  Screening. EXAM: DIGITAL SCREENING BILATERAL MAMMOGRAM WITH TOMOSYNTHESIS AND  CAD TECHNIQUE: Bilateral screening digital craniocaudal and mediolateral oblique mammograms were obtained. Bilateral screening digital breast tomosynthesis was performed. The images  were evaluated with computer-aided detection. COMPARISON:  Previous exam(s). ACR Breast Density Category b: There are scattered areas of fibroglandular density. FINDINGS: There are no findings suspicious for malignancy. IMPRESSION: No mammographic evidence of malignancy. A result letter of this screening mammogram will be mailed directly to the patient. RECOMMENDATION: Screening mammogram in one year. (Code:SM-B-01Y) BI-RADS CATEGORY  1: Negative. Electronically Signed   By: Bary Richard M.D.   On: 01/22/2023 11:01   CT CHEST ABDOMEN PELVIS W CONTRAST  Result Date: 12/26/2022 CLINICAL DATA:  Metastatic rectal adenocarcinoma; * Tracking Code: BO * EXAM: CT CHEST, ABDOMEN, AND PELVIS WITH CONTRAST TECHNIQUE: Multidetector CT imaging of the chest, abdomen and pelvis was performed following the standard protocol during bolus administration of intravenous contrast. RADIATION DOSE REDUCTION: This exam was performed according to the departmental dose-optimization program which includes automated exposure control, adjustment of the mA and/or kV according to patient size and/or use of iterative reconstruction technique. CONTRAST:  OMNIPAQUE IOHEXOL 300 MG/ML  SOLN COMPARISON:  PET-CT dated September 19, 2022 FINDINGS: CT CHEST FINDINGS Cardiovascular: Normal heart size. No pericardial effusion. Normal caliber thoracic aorta mild atherosclerotic disease. Right chest wall port with tip in the right atrium. Mild coronary artery calcifications. Mediastinum/Nodes: Esophagus and thyroid are unremarkable. No pathologically enlarged lymph nodes seen in the chest. Lungs/Pleura: Central airways are patent. Scattered small solid pulmonary nodules stable. Reference irregular solid pulmonary nodule of the right middle lobe measuring 10 x 7 mm on series 4, image 102, unchanged when compared with the prior exam and remeasured in similar plane. Musculoskeletal: Osseous lesion of the proximal right humerus demonstrates increased  sclerosis when compared with the prior PET-CT possibly due to treatment response. CT ABDOMEN PELVIS FINDINGS Hepatobiliary: No suspicious liver lesion. Mild intra and extrahepatic biliary ductal dilation, unchanged when compared with the prior exams. Pancreas: Unremarkable. No pancreatic ductal dilatation or surrounding inflammatory changes. Spleen: Normal in size without focal abnormality. Adrenals/Urinary Tract: Bilateral adrenal glands are unremarkable. No hydronephrosis or nephrolithiasis. Bilateral simple appearing renal cysts, no specific follow-up imaging is recommended for these lesions. No suspicious renal lesions. Stomach/Bowel: Postsurgical changes of prior APR with left lower quadrant colostomy. Normal appearance of the stomach. Normal appendix. No evidence of obstruction. Vascular/Lymphatic: Aortic atherosclerosis. Stable size of right inguinal and right pelvic lymph nodes. Reference right inguinal lymph node measuring 11 mm on series 2, image 105. Reference right inguinal lymph node measuring 6 mm on series 2, image 98, not significantly changed in size. Reproductive: No adnexal mass. Other: No abdominopelvic ascites. Musculoskeletal: Osseous irregularity of the left iliac bone, unchanged when compared with prior exam. IMPRESSION: 1. Stable scattered solid pulmonary nodules. 2. Right inguinal and right pelvic lymph nodes which were hypermetabolic on prior PET-CT are stable. 3. Osseous lesion of the proximal right humerus demonstrates increased sclerosis when compared with the prior PET-CT, possibly due to treatment response. 4. 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 5. Aortic Atherosclerosis (ICD10-I70.0). Electronically Signed   By: Allegra Lai M.D.   On: 12/26/2022 09:05

## 2023-02-13 NOTE — Assessment & Plan Note (Signed)
IV Mag 2g PRN Mag <=1.6.  Continue slow mag orally 64mg twice daily.  

## 2023-02-15 ENCOUNTER — Inpatient Hospital Stay: Payer: 59 | Attending: Oncology

## 2023-02-15 DIAGNOSIS — Z800 Family history of malignant neoplasm of digestive organs: Secondary | ICD-10-CM | POA: Diagnosis not present

## 2023-02-15 DIAGNOSIS — Z5112 Encounter for antineoplastic immunotherapy: Secondary | ICD-10-CM | POA: Diagnosis not present

## 2023-02-15 DIAGNOSIS — Z7901 Long term (current) use of anticoagulants: Secondary | ICD-10-CM | POA: Insufficient documentation

## 2023-02-15 DIAGNOSIS — Z87891 Personal history of nicotine dependence: Secondary | ICD-10-CM | POA: Insufficient documentation

## 2023-02-15 DIAGNOSIS — D071 Carcinoma in situ of vulva: Secondary | ICD-10-CM | POA: Insufficient documentation

## 2023-02-15 DIAGNOSIS — N950 Postmenopausal bleeding: Secondary | ICD-10-CM | POA: Insufficient documentation

## 2023-02-15 DIAGNOSIS — D6481 Anemia due to antineoplastic chemotherapy: Secondary | ICD-10-CM | POA: Insufficient documentation

## 2023-02-15 DIAGNOSIS — E7800 Pure hypercholesterolemia, unspecified: Secondary | ICD-10-CM | POA: Insufficient documentation

## 2023-02-15 DIAGNOSIS — I2510 Atherosclerotic heart disease of native coronary artery without angina pectoris: Secondary | ICD-10-CM | POA: Insufficient documentation

## 2023-02-15 DIAGNOSIS — C20 Malignant neoplasm of rectum: Secondary | ICD-10-CM

## 2023-02-15 DIAGNOSIS — N393 Stress incontinence (female) (male): Secondary | ICD-10-CM | POA: Insufficient documentation

## 2023-02-15 DIAGNOSIS — C799 Secondary malignant neoplasm of unspecified site: Secondary | ICD-10-CM

## 2023-02-15 DIAGNOSIS — D701 Agranulocytosis secondary to cancer chemotherapy: Secondary | ICD-10-CM | POA: Insufficient documentation

## 2023-02-15 DIAGNOSIS — K435 Parastomal hernia without obstruction or  gangrene: Secondary | ICD-10-CM | POA: Diagnosis not present

## 2023-02-15 DIAGNOSIS — K219 Gastro-esophageal reflux disease without esophagitis: Secondary | ICD-10-CM | POA: Insufficient documentation

## 2023-02-15 DIAGNOSIS — Z79899 Other long term (current) drug therapy: Secondary | ICD-10-CM | POA: Insufficient documentation

## 2023-02-15 DIAGNOSIS — C7800 Secondary malignant neoplasm of unspecified lung: Secondary | ICD-10-CM | POA: Insufficient documentation

## 2023-02-15 DIAGNOSIS — E876 Hypokalemia: Secondary | ICD-10-CM | POA: Insufficient documentation

## 2023-02-15 DIAGNOSIS — R92323 Mammographic fibroglandular density, bilateral breasts: Secondary | ICD-10-CM | POA: Insufficient documentation

## 2023-02-15 DIAGNOSIS — I129 Hypertensive chronic kidney disease with stage 1 through stage 4 chronic kidney disease, or unspecified chronic kidney disease: Secondary | ICD-10-CM | POA: Insufficient documentation

## 2023-02-15 DIAGNOSIS — T451X5A Adverse effect of antineoplastic and immunosuppressive drugs, initial encounter: Secondary | ICD-10-CM | POA: Diagnosis not present

## 2023-02-15 DIAGNOSIS — K5900 Constipation, unspecified: Secondary | ICD-10-CM | POA: Insufficient documentation

## 2023-02-15 DIAGNOSIS — Z86711 Personal history of pulmonary embolism: Secondary | ICD-10-CM | POA: Diagnosis not present

## 2023-02-15 DIAGNOSIS — R197 Diarrhea, unspecified: Secondary | ICD-10-CM | POA: Diagnosis not present

## 2023-02-15 DIAGNOSIS — I700 Atherosclerosis of aorta: Secondary | ICD-10-CM | POA: Diagnosis not present

## 2023-02-15 DIAGNOSIS — R634 Abnormal weight loss: Secondary | ICD-10-CM | POA: Insufficient documentation

## 2023-02-15 LAB — CEA: CEA: 604 ng/mL — ABNORMAL HIGH (ref 0.0–4.7)

## 2023-02-15 MED ORDER — PEGFILGRASTIM INJECTION 6 MG/0.6ML ~~LOC~~
6.0000 mg | PREFILLED_SYRINGE | Freq: Once | SUBCUTANEOUS | Status: AC
  Administered 2023-02-15: 6 mg via SUBCUTANEOUS
  Filled 2023-02-15: qty 0.6

## 2023-02-20 NOTE — Progress Notes (Deleted)
    GYNECOLOGY PROGRESS NOTE  Subjective:    Patient ID: MERTHA CLUKEY, female    DOB: [birth year], 77 y.o.   MRN: [id number]  HPI  Patient is a 77 y.o. [id number] female who presents for   {Common ambulatory SmartLinks:19316}  Review of Systems {ros; complete:30496}   Objective:   There were no vitals taken for this visit. There is no height or weight on file to calculate BMI. General appearance: {general exam:16600} Abdomen: {abdominal exam:16834} Pelvic: {pelvic exam:16852::"cervix normal in appearance","external genitalia normal","no adnexal masses or tenderness","no cervical motion tenderness","rectovaginal septum normal","uterus normal size, shape, and consistency","vagina normal without discharge"} Extremities: {extremity exam:5109} Neurologic: {neuro exam:17854}   Assessment:   No diagnosis found.   Plan:   There are no diagnoses linked to this encounter.

## 2023-02-22 ENCOUNTER — Ambulatory Visit: Payer: 59 | Admitting: Obstetrics and Gynecology

## 2023-02-22 ENCOUNTER — Ambulatory Visit (INDEPENDENT_AMBULATORY_CARE_PROVIDER_SITE_OTHER): Payer: 59 | Admitting: Obstetrics and Gynecology

## 2023-02-22 ENCOUNTER — Encounter: Payer: Self-pay | Admitting: Obstetrics and Gynecology

## 2023-02-22 VITALS — BP 134/62 | HR 74 | Ht 64.0 in | Wt 160.0 lb

## 2023-02-22 DIAGNOSIS — N9089 Other specified noninflammatory disorders of vulva and perineum: Secondary | ICD-10-CM

## 2023-02-22 DIAGNOSIS — C20 Malignant neoplasm of rectum: Secondary | ICD-10-CM | POA: Diagnosis not present

## 2023-02-22 DIAGNOSIS — C799 Secondary malignant neoplasm of unspecified site: Secondary | ICD-10-CM

## 2023-02-22 DIAGNOSIS — Z8542 Personal history of malignant neoplasm of other parts of uterus: Secondary | ICD-10-CM | POA: Diagnosis not present

## 2023-02-22 NOTE — Progress Notes (Signed)
GYNECOLOGY PROGRESS NOTE  Subjective:    Patient ID: Jacqueline Yoder, female    DOB: 15-Apr-1946, 77 y.o.   MRN: [id number]  HPI  Patient is a 77 y.o. [id number] female who presents for complaints of vaginal bump and bleeding.  Also feels like an area near her urethra also has a bump.  Patient has a history of rectal cancer and endometrial cancer (endometriod carcionoma, FIGO grade 1 s/p robotic TLH/BSO, sentinel lymph node sampling, performed in 2020).  Patient currently undergoing chemotherapy again for metastatic adenocarcinoma.  Received initial chemotherapy and radiation in 2020. Has colostomy bag.  Notes that she has not seen the GYN or GYN Oncologist in the past several years.   She does report that since starting her chemotherapy again she has had issues with diarrhea. Has been having to wipe more.    Past Medical History:  Diagnosis Date   Allergy    Arthritis    Blood clot in vein    Family history of colon cancer    GERD (gastroesophageal reflux disease)    Hypercholesteremia    Hypertension    Hypertension    Lower extremity edema    Personal history of chemotherapy    Rectal cancer (HCC) 12/2018   Urinary incontinence     OB History  Gravida Para Term Preterm AB Living  6 4 4   1 4   SAB IAB Ectopic Multiple Live Births  1            # Outcome Date GA Lbr Len/2nd Weight Sex Delivery Anes PTL Lv  6 Gravida           5 Term           4 Term           3 Term           2 Term           1 SAB             Obstetric Comments  Menstrual age: 41    Age 1st Pregnancy: 89    Family History  Problem Relation Age of Onset   Colon cancer Brother 68       exposure to chemicals [place]   Hypertension Mother    Stroke Mother    Kidney failure Father    Breast cancer Neg Hx    Ovarian cancer Neg Hx     Past Surgical History:  Procedure Laterality Date   ABDOMINAL HYSTERECTOMY     CHOLECYSTECTOMY  1971   COLONOSCOPY WITH PROPOFOL N/A 12/03/2018   Procedure:  COLONOSCOPY WITH PROPOFOL;  Surgeon: Midge Minium, MD;  Location: [facility] ENDOSCOPY;  Service: Endoscopy;  Laterality: N/A;   COLONOSCOPY WITH PROPOFOL N/A 07/15/2020   Procedure: COLONOSCOPY WITH PROPOFOL;  Surgeon: Wyline Mood, MD;  Location: [facility] ENDOSCOPY;  Service: Gastroenterology;  Laterality: N/A;   FLEXIBLE SIGMOIDOSCOPY N/A 12/06/2018   Procedure: FLEXIBLE SIGMOIDOSCOPY;  Surgeon: Wyline Mood, MD;  Location: [facility] ENDOSCOPY;  Service: Endoscopy;  Laterality: N/A;   LAPAROSCOPIC COLOSTOMY  01/06/2019   PORTACATH PLACEMENT N/A 04/03/2019   Procedure: INSERTION PORT-A-CATH;  Surgeon: Leafy Ro, MD;  Location: [facility] ORS;  Service: General;  Laterality: N/A;    Social History   Socioeconomic History   Marital status: Widowed    Spouse name: Not on file   Number of children: Not on file   Years of education: Not on file   Highest education level: Not on file  Occupational History   Not on file  Tobacco Use   Smoking status: Former    Types: Cigarettes    Quit date: 12/02/1977    Years since quitting: 45.2   Smokeless tobacco: Former  Building services engineer Use: Never used  Substance and Sexual Activity   Alcohol use: Never   Drug use: Never   Sexual activity: Not Currently    Birth control/protection: None  Other Topics Concern   Not on file  Social History Narrative   Lives with daughter   Social Determinants of Health   Financial Resource Strain: Medium Risk (06/30/2022)   Overall Financial Resource Strain (CARDIA)    Difficulty of Paying Living Expenses: Somewhat hard  Food Insecurity: No Food Insecurity (06/30/2022)   Hunger Vital Sign    Worried About Running Out of Food in the Last Year: Never true    Ran Out of Food in the Last Year: Never true  Transportation Needs: No Transportation Needs (06/30/2022)   PRAPARE - Administrator, Civil Service (Medical): No    Lack of Transportation (Non-Medical): No  Physical Activity: Inactive (06/30/2022)   Exercise  Vital Sign    Days of Exercise per Week: 0 days    Minutes of Exercise per Session: 0 min  Stress: No Stress Concern Present (06/30/2022)   [employer] of Occupational Health - Occupational Stress Questionnaire    Feeling of Stress : Not at all  Social Connections: Moderately Isolated (06/30/2022)   Social Connection and Isolation Panel [NHANES]    Frequency of Communication with Friends and Family: More than three times a week    Frequency of Social Gatherings with Friends and Family: Twice a week    Attends Religious Services: More than 4 times per year    Active Member of [employer] or Organizations: No    Attends Banker Meetings: Never    Marital Status: Widowed  Intimate Partner Violence: Not At Risk (06/30/2022)   Humiliation, Afraid, Rape, and Kick questionnaire    Fear of Current or Ex-Partner: No    Emotionally Abused: No    Physically Abused: No    Sexually Abused: No    Current Outpatient Medications on File Prior to Visit  Medication Sig Dispense Refill   Cholecalciferol (VITAMIN D3) 2000 units capsule Take 2,000 Units by mouth daily.     cholestyramine (QUESTRAN) 4 g packet Take 1 packet (4 g total) by mouth 3 (three) times daily. 90 each 1   clindamycin (CLINDAGEL) 1 % gel Apply topically 2 (two) times daily. 30 g 5   diclofenac sodium (VOLTAREN) 1 % GEL Apply 2 g topically 4 (four) times daily as needed (joint pain).  11   diphenoxylate-atropine (LOMOTIL) 2.5-0.025 MG tablet TAKE 1 TABLET BY MOUTH 4 TIMES DAILY AS NEEDED FOR  DIARRHEA  OR  LOOSE  STOOLS 60 tablet 0   ELIQUIS 2.5 MG TABS tablet TAKE 1 TABLET BY MOUTH TWICE  DAILY 200 tablet 2   fluticasone (FLONASE) 50 MCG/ACT nasal spray USE 1 SPRAY IN EACH NOSTRIL ONCE DAILY 16 g 3   ipratropium (ATROVENT) 0.03 % nasal spray Place 1 spray into both nostrils 2 (two) times daily.     ketoconazole (NIZORAL) 2 % cream Apply 1 Application topically daily. For up to 2 weeks. May repeat if need. 30 g 2    lidocaine-prilocaine (EMLA) cream Apply 1 application. topically as needed. 30 g 6   loratadine (CLARITIN) 10 MG tablet Take 10  mg by mouth daily.     magnesium chloride (SLOW-MAG) 64 MG TBEC SR tablet Take 1 tablet (64 mg total) by mouth 2 (two) times daily. 30 tablet 1   Multiple Vitamins-Minerals (ONE-A-DAY WOMENS 50 PLUS PO) Take 1 tablet by mouth daily.      nystatin (MYCOSTATIN/NYSTOP) powder Apply 1 Application topically 3 (three) times daily. 45 g 0   potassium chloride SA (KLOR-CON M) 20 MEQ tablet Take 1 tablet (20 mEq total) by mouth daily. 100 tablet 1   simvastatin (ZOCOR) 40 MG tablet Take 1 tablet (40 mg total) by mouth at bedtime. 100 tablet 3   triamterene-hydrochlorothiazide (DYAZIDE) 37.5-25 MG capsule Take 1 each (1 capsule total) by mouth daily. 100 capsule 3   zinc gluconate 50 MG tablet Take 50 mg by mouth daily.     loperamide (IMODIUM) 2 MG capsule Take 1 capsule (2 mg total) by mouth See admin instructions. With onset of loose stool, take 4mg  followed by 2mg  every 2 hours,  Maximum: 16 mg/day 120 capsule 1   triamcinolone cream (KENALOG) 0.5 % Apply 1 Application topically 2 (two) times daily. To affected areas, for up to 2 weeks. (Patient not taking: Reported on 02/22/2023) 30 g 2   [DISCONTINUED] prochlorperazine (COMPAZINE) 10 MG tablet Take 1 tablet (10 mg total) by mouth every 6 (six) hours as needed (NAUSEA). 30 tablet 1   No current facility-administered medications on file prior to visit.    Allergies  Allergen Reactions   Sulfamethoxazole-Trimethoprim Other (See Comments)    Also said she had chills and pressure in nose.     Review of Systems Pertinent items noted in HPI and remainder of comprehensive ROS otherwise negative.   Objective:   Blood pressure 134/62, pulse 74, height 5\' 4"  (1.626 m), weight 160 lb (72.6 kg). Body mass index is 27.46 kg/m. General appearance: alert and no distress Abdomen: soft, non-tender. Ostomy site in LLQ.  Pelvic:  external genitalia with pinpoint excoriations/ulcerations on right labia majora.  Clitoral hood with ~ 1 x 1 cm lesion on right, nodular hypopigmented lesion, slightly raised. Vagina stenotic (likely de to radiation therapy), unable to utilize speculum but able to digitalize with index finger, no palpable masses but very shortened canal, scant blood noted on glove after exam.  Patient unable to tolerate rectal exam.  Extremities: extremities normal, atraumatic, no cyanosis or edema Neurologic: Grossly normal   Assessment:   1. History of endometrial cancer   2. Rectal cancer (HCC)   3. Metastatic adenocarcinoma (HCC)   4. Vulvar lesion      Plan:   - Exam today raising new concern of possible metastatic disease to the vulvar/clitoral area. Is currently undergoing chemotherapy again for metastatic adenocarcinoma. Stressed importance of follow up with both Oncology teams (colorectal and GYN).  Will place referral back to GYN Oncology for further follow up and management. Advised use of topical OTC remedies for vulvar excoriations for irritation as needed (such as coconut oil, petroleum jelly, or aloe).  - Will also have patient follow up in 6 months with GYN.    A total of 25 minutes were spent during this encounter, including review of previous progress notes, recent imaging and labs, face-to-face with time with patient involving counseling and coordination of care, as well as documentation for current visit.   Hildred Laser, MD [facility]

## 2023-02-26 MED FILL — Dexamethasone Sodium Phosphate Inj 100 MG/10ML: INTRAMUSCULAR | Qty: 1 | Status: AC

## 2023-02-27 ENCOUNTER — Inpatient Hospital Stay (HOSPITAL_BASED_OUTPATIENT_CLINIC_OR_DEPARTMENT_OTHER): Payer: 59 | Admitting: Oncology

## 2023-02-27 ENCOUNTER — Inpatient Hospital Stay: Payer: 59

## 2023-02-27 ENCOUNTER — Ambulatory Visit: Payer: 59

## 2023-02-27 ENCOUNTER — Encounter: Payer: Self-pay | Admitting: Oncology

## 2023-02-27 VITALS — BP 108/62 | HR 75 | Temp 96.0°F | Resp 18 | Wt 159.8 lb

## 2023-02-27 DIAGNOSIS — R92323 Mammographic fibroglandular density, bilateral breasts: Secondary | ICD-10-CM | POA: Diagnosis not present

## 2023-02-27 DIAGNOSIS — Z86711 Personal history of pulmonary embolism: Secondary | ICD-10-CM

## 2023-02-27 DIAGNOSIS — C7800 Secondary malignant neoplasm of unspecified lung: Secondary | ICD-10-CM | POA: Diagnosis not present

## 2023-02-27 DIAGNOSIS — I129 Hypertensive chronic kidney disease with stage 1 through stage 4 chronic kidney disease, or unspecified chronic kidney disease: Secondary | ICD-10-CM | POA: Diagnosis not present

## 2023-02-27 DIAGNOSIS — I2510 Atherosclerotic heart disease of native coronary artery without angina pectoris: Secondary | ICD-10-CM | POA: Diagnosis not present

## 2023-02-27 DIAGNOSIS — K521 Toxic gastroenteritis and colitis: Secondary | ICD-10-CM | POA: Diagnosis not present

## 2023-02-27 DIAGNOSIS — Z87891 Personal history of nicotine dependence: Secondary | ICD-10-CM | POA: Diagnosis not present

## 2023-02-27 DIAGNOSIS — C20 Malignant neoplasm of rectum: Secondary | ICD-10-CM

## 2023-02-27 DIAGNOSIS — Z79899 Other long term (current) drug therapy: Secondary | ICD-10-CM | POA: Diagnosis not present

## 2023-02-27 DIAGNOSIS — Z7901 Long term (current) use of anticoagulants: Secondary | ICD-10-CM | POA: Diagnosis not present

## 2023-02-27 DIAGNOSIS — C799 Secondary malignant neoplasm of unspecified site: Secondary | ICD-10-CM | POA: Diagnosis not present

## 2023-02-27 DIAGNOSIS — Z5112 Encounter for antineoplastic immunotherapy: Secondary | ICD-10-CM | POA: Diagnosis not present

## 2023-02-27 DIAGNOSIS — D701 Agranulocytosis secondary to cancer chemotherapy: Secondary | ICD-10-CM | POA: Diagnosis not present

## 2023-02-27 DIAGNOSIS — Z800 Family history of malignant neoplasm of digestive organs: Secondary | ICD-10-CM | POA: Diagnosis not present

## 2023-02-27 DIAGNOSIS — R634 Abnormal weight loss: Secondary | ICD-10-CM | POA: Diagnosis not present

## 2023-02-27 DIAGNOSIS — K435 Parastomal hernia without obstruction or  gangrene: Secondary | ICD-10-CM | POA: Diagnosis not present

## 2023-02-27 DIAGNOSIS — E876 Hypokalemia: Secondary | ICD-10-CM

## 2023-02-27 DIAGNOSIS — T451X5A Adverse effect of antineoplastic and immunosuppressive drugs, initial encounter: Secondary | ICD-10-CM | POA: Diagnosis not present

## 2023-02-27 DIAGNOSIS — D6481 Anemia due to antineoplastic chemotherapy: Secondary | ICD-10-CM

## 2023-02-27 DIAGNOSIS — Z5111 Encounter for antineoplastic chemotherapy: Secondary | ICD-10-CM | POA: Diagnosis not present

## 2023-02-27 DIAGNOSIS — E7800 Pure hypercholesterolemia, unspecified: Secondary | ICD-10-CM | POA: Diagnosis not present

## 2023-02-27 DIAGNOSIS — K219 Gastro-esophageal reflux disease without esophagitis: Secondary | ICD-10-CM | POA: Diagnosis not present

## 2023-02-27 DIAGNOSIS — K5900 Constipation, unspecified: Secondary | ICD-10-CM | POA: Diagnosis not present

## 2023-02-27 DIAGNOSIS — N939 Abnormal uterine and vaginal bleeding, unspecified: Secondary | ICD-10-CM

## 2023-02-27 DIAGNOSIS — R197 Diarrhea, unspecified: Secondary | ICD-10-CM | POA: Diagnosis not present

## 2023-02-27 DIAGNOSIS — I700 Atherosclerosis of aorta: Secondary | ICD-10-CM | POA: Diagnosis not present

## 2023-02-27 LAB — CBC WITH DIFFERENTIAL/PLATELET
Abs Immature Granulocytes: 0.01 10*3/uL (ref 0.00–0.07)
Basophils Absolute: 0 10*3/uL (ref 0.0–0.1)
Basophils Relative: 1 %
Eosinophils Absolute: 0.2 10*3/uL (ref 0.0–0.5)
Eosinophils Relative: 3 %
HCT: 37.3 % (ref 36.0–46.0)
Hemoglobin: 11.6 g/dL — ABNORMAL LOW (ref 12.0–15.0)
Immature Granulocytes: 0 %
Lymphocytes Relative: 22 %
Lymphs Abs: 1.3 10*3/uL (ref 0.7–4.0)
MCH: 30 pg (ref 26.0–34.0)
MCHC: 31.1 g/dL (ref 30.0–36.0)
MCV: 96.4 fL (ref 80.0–100.0)
Monocytes Absolute: 0.4 10*3/uL (ref 0.1–1.0)
Monocytes Relative: 7 %
Neutro Abs: 4 10*3/uL (ref 1.7–7.7)
Neutrophils Relative %: 67 %
Platelets: 215 10*3/uL (ref 150–400)
RBC: 3.87 MIL/uL (ref 3.87–5.11)
RDW: 17 % — ABNORMAL HIGH (ref 11.5–15.5)
WBC: 5.9 10*3/uL (ref 4.0–10.5)
nRBC: 0 % (ref 0.0–0.2)

## 2023-02-27 LAB — COMPREHENSIVE METABOLIC PANEL
ALT: 24 U/L (ref 0–44)
AST: 37 U/L (ref 15–41)
Albumin: 3.8 g/dL (ref 3.5–5.0)
Alkaline Phosphatase: 79 U/L (ref 38–126)
Anion gap: 9 (ref 5–15)
BUN: 18 mg/dL (ref 8–23)
CO2: 28 mmol/L (ref 22–32)
Calcium: 8.7 mg/dL — ABNORMAL LOW (ref 8.9–10.3)
Chloride: 103 mmol/L (ref 98–111)
Creatinine, Ser: 0.71 mg/dL (ref 0.44–1.00)
GFR, Estimated: >60 mL/min (ref >60)
Glucose, Bld: 94 mg/dL (ref 70–99)
Potassium: 3.8 mmol/L (ref 3.5–5.1)
Sodium: 140 mmol/L (ref 135–145)
Total Bilirubin: 0.4 mg/dL (ref 0.3–1.2)
Total Protein: 6.6 g/dL (ref 6.5–8.1)

## 2023-02-27 LAB — MAGNESIUM: Magnesium: 1.4 mg/dL — ABNORMAL LOW (ref 1.7–2.4)

## 2023-02-27 MED ORDER — SODIUM CHLORIDE 0.9 % IV SOLN
100.0000 mg/m2 | Freq: Once | INTRAVENOUS | Status: AC
  Administered 2023-02-27: 180 mg via INTRAVENOUS
  Filled 2023-02-27: qty 4

## 2023-02-27 MED ORDER — SODIUM CHLORIDE 0.9 % IV SOLN
10.0000 mg | Freq: Once | INTRAVENOUS | Status: AC
  Administered 2023-02-27: 10 mg via INTRAVENOUS
  Filled 2023-02-27: qty 10

## 2023-02-27 MED ORDER — SODIUM CHLORIDE 0.9% FLUSH
10.0000 mL | INTRAVENOUS | Status: DC | PRN
  Filled 2023-02-27: qty 10

## 2023-02-27 MED ORDER — DIPHENHYDRAMINE HCL 50 MG/ML IJ SOLN
25.0000 mg | Freq: Once | INTRAMUSCULAR | Status: AC
  Administered 2023-02-27: 25 mg via INTRAVENOUS
  Filled 2023-02-27: qty 1

## 2023-02-27 MED ORDER — SODIUM CHLORIDE 0.9 % IV SOLN
Freq: Once | INTRAVENOUS | Status: AC
  Filled 2023-02-27: qty 250

## 2023-02-27 MED ORDER — PALONOSETRON HCL INJECTION 0.25 MG/5ML
0.2500 mg | Freq: Once | INTRAVENOUS | Status: AC
  Administered 2023-02-27: 0.25 mg via INTRAVENOUS
  Filled 2023-02-27: qty 5

## 2023-02-27 MED ORDER — ATROPINE SULFATE 1 MG/ML IV SOLN
0.5000 mg | Freq: Once | INTRAVENOUS | Status: AC
  Administered 2023-02-27: 0.5 mg via INTRAVENOUS
  Filled 2023-02-27: qty 1

## 2023-02-27 MED ORDER — MAGNESIUM SULFATE 2 GM/50ML IV SOLN
2.0000 g | Freq: Once | INTRAVENOUS | Status: AC
  Administered 2023-02-27: 2 g via INTRAVENOUS
  Filled 2023-02-27: qty 50

## 2023-02-27 MED ORDER — HEPARIN SOD (PORK) LOCK FLUSH 100 UNIT/ML IV SOLN
500.0000 [IU] | Freq: Once | INTRAVENOUS | Status: AC | PRN
  Administered 2023-02-27: 500 [IU]
  Filled 2023-02-27: qty 5

## 2023-02-27 MED ORDER — SODIUM CHLORIDE 0.9 % IV SOLN
6.0000 mg/kg | Freq: Once | INTRAVENOUS | Status: AC
  Administered 2023-02-27: 440 mg via INTRAVENOUS
  Filled 2023-02-27: qty 2

## 2023-02-27 NOTE — Patient Instructions (Signed)
[facility]  Discharge Instructions: Thank you for choosing [facility] to provide your oncology and hematology care.  If you have a lab appointment with the Cancer Center, please go directly to the Cancer Center and check in at the registration area.  Wear comfortable clothing and clothing appropriate for easy access to any Portacath or PICC line.   We strive to give you quality time with your provider. You may need to reschedule your appointment if you arrive late (15 or more minutes).  Arriving late affects you and other patients whose appointments are after yours.  Also, if you miss three or more appointments without notifying the office, you may be dismissed from the clinic at the provider's discretion.      For prescription refill requests, have your pharmacy contact our office and allow 72 hours for refills to be completed.    Today you received the following chemotherapy and/or immunotherapy agents VECTIBEX, IRINOTECAN, MAGNESIUM      To help prevent nausea and vomiting after your treatment, we encourage you to take your nausea medication as directed.  BELOW ARE SYMPTOMS THAT SHOULD BE REPORTED IMMEDIATELY: *FEVER GREATER THAN 100.4 F (38 C) OR HIGHER *CHILLS OR SWEATING *NAUSEA AND VOMITING THAT IS NOT CONTROLLED WITH YOUR NAUSEA MEDICATION *UNUSUAL SHORTNESS OF BREATH *UNUSUAL BRUISING OR BLEEDING *URINARY PROBLEMS (pain or burning when urinating, or frequent urination) *BOWEL PROBLEMS (unusual diarrhea, constipation, pain near the anus) TENDERNESS IN MOUTH AND THROAT WITH OR WITHOUT PRESENCE OF ULCERS (sore throat, sores in mouth, or a toothache) UNUSUAL RASH, SWELLING OR PAIN  UNUSUAL VAGINAL DISCHARGE OR ITCHING   Items with * indicate a potential emergency and should be followed up as soon as possible or go to the Emergency Department if any problems should occur.  Please show the CHEMOTHERAPY ALERT CARD or IMMUNOTHERAPY  ALERT CARD at check-in to the Emergency Department and triage nurse.  Should you have questions after your visit or need to cancel or reschedule your appointment, please contact [facility]  [phone] and follow the prompts.  Office hours are 8:00 a.m. to 4:30 p.m. Monday - Friday. Please note that voicemails left after 4:00 p.m. may not be returned until the following business day.  We are closed weekends and major holidays. You have access to a nurse at all times for urgent questions. Please call the main number to the clinic [phone] and follow the prompts.  For any non-urgent questions, you may also contact your provider using MyChart. We now offer e-Visits for anyone 18 and older to request care online for non-urgent symptoms. For details visit [email or web address].   Also download the MyChart app! Go to the app store, search "MyChart", open the app, select [facility], and log in with your MyChart username and password.  Panitumumab Injection What is this medication? PANITUMUMAB (pan i TOOM ue mab) treats colorectal cancer. It works by blocking a protein that causes cancer cells to grow and multiply. This helps to slow or stop the spread of cancer cells. It is a monoclonal antibody. This medicine may be used for other purposes; ask your health care provider or pharmacist if you have questions. COMMON BRAND NAME(S): Vectibix What should I tell my care team before I take this medication? They need to know if you have any of these conditions: Eye disease Low levels of magnesium in the blood Lung disease An unusual or allergic reaction to panitumumab, other medications, foods,   dyes, or preservatives Pregnant or trying to get pregnant Breast-feeding How should I use this medication? This medication is injected into a vein. It is given by your care team in a hospital or clinic setting. Talk to your care team about the use of this medication in  children. Special care may be needed. Overdosage: If you think you have taken too much of this medicine contact a poison control center or emergency room at once. NOTE: This medicine is only for you. Do not share this medicine with others. What if I miss a dose? Keep appointments for follow-up doses. It is important not to miss your dose. Call your care team if you are unable to keep an appointment. What may interact with this medication? Bevacizumab This list may not describe all possible interactions. Give your health care provider a list of all the medicines, herbs, non-prescription drugs, or dietary supplements you use. Also tell them if you smoke, drink alcohol, or use illegal drugs. Some items may interact with your medicine. What should I watch for while using this medication? Your condition will be monitored carefully while you are receiving this medication. This medication may make you feel generally unwell. This is not uncommon as chemotherapy can affect healthy cells as well as cancer cells. Report any side effects. Continue your course of treatment even though you feel ill unless your care team tells you to stop. This medication can make you more sensitive to the sun. Keep out of the sun while receiving this medication and for 2 months after stopping therapy. If you cannot avoid being in the sun, wear protective clothing and sunscreen. Do not use sun lamps, tanning beds, or tanning booths. Check with your care team if you have severe diarrhea, nausea, and vomiting or if you sweat a lot. The loss of too much body fluid may make it dangerous for you to take this medication. This medication may cause serious skin reactions. They can happen weeks to months after starting the medication. Contact your care team right away if you notice fevers or flu-like symptoms with a rash. The rash may be red or purple and then turn into blisters or peeling of the skin. You may also notice a red rash with  swelling of the face, lips, or lymph nodes in your neck or under your arms. Talk to your care team if you may be pregnant. Serious birth defects can occur if you take this medication during pregnancy and for 2 months after the last dose. Contraception is recommended while taking this medication and for 2 months after the last dose. Your care team can help you find the option that works for you. Do not breastfeed while taking this medication and for 2 months after the last dose. This medication may cause infertility. Talk to your care team if you are concerned about your fertility. What side effects may I notice from receiving this medication? Side effects that you should report to your care team as soon as possible: Allergic reactions--skin rash, itching, hives, swelling of the face, lips, tongue, or throat Dry cough, shortness of breath or trouble breathing Eye pain, redness, irritation, or discharge with blurry or decreased vision Infusion reactions--chest pain, shortness of breath or trouble breathing, feeling faint or lightheaded Low magnesium level--muscle pain or cramps, unusual weakness or fatigue, fast or irregular heartbeat, tremors Low potassium level--muscle pain or cramps, unusual weakness or fatigue, fast or irregular heartbeat, constipation Redness, blistering, peeling, or loosening of the skin, including inside the   mouth Skin reactions on sun-exposed areas Side effects that usually do not require medical attention (report to your care team if they continue or are bothersome): Change in nail shape, thickness, or color Diarrhea Dry skin Fatigue Nausea Vomiting This list may not describe all possible side effects. Call your doctor for medical advice about side effects. You may report side effects to FDA at 1-800-FDA-1088. Where should I keep my medication? This medication is given in a hospital or clinic. It will not be stored at home. NOTE: This sheet is a summary. It may not  cover all possible information. If you have questions about this medicine, talk to your doctor, pharmacist, or health care provider.  2023 Elsevier/Gold Standard (2022-02-13 00:00:00)   Irinotecan Injection What is this medication? IRINOTECAN (ir in oh TEE kan) treats some types of cancer. It works by slowing down the growth of cancer cells. This medicine may be used for other purposes; ask your health care provider or pharmacist if you have questions. COMMON BRAND NAME(S): Camptosar What should I tell my care team before I take this medication? They need to know if you have any of these conditions: Dehydration Diarrhea Infection, especially a viral infection, such as chickenpox, cold sores, herpes Liver disease Low blood cell levels (white cells, red cells, and platelets) Low levels of electrolytes, such as calcium, magnesium, or potassium in your blood Recent or ongoing radiation An unusual or allergic reaction to irinotecan, other medications, foods, dyes, or preservatives If you or your partner are pregnant or trying to get pregnant Breast-feeding How should I use this medication? This medication is injected into a vein. It is given by your care team in a hospital or clinic setting. Talk to your care team about the use of this medication in children. Special care may be needed. Overdosage: If you think you have taken too much of this medicine contact a poison control center or emergency room at once. NOTE: This medicine is only for you. Do not share this medicine with others. What if I miss a dose? Keep appointments for follow-up doses. It is important not to miss your dose. Call your care team if you are unable to keep an appointment. What may interact with this medication? Do not take this medication with any of the following: Cobicistat Itraconazole This medication may also interact with the following: Certain antibiotics, such as clarithromycin, rifampin, rifabutin Certain  antivirals for HIV or AIDS Certain medications for fungal infections, such as ketoconazole, posaconazole, voriconazole Certain medications for seizures, such as carbamazepine, phenobarbital, phenytoin Gemfibrozil Nefazodone St. John's wort This list may not describe all possible interactions. Give your health care provider a list of all the medicines, herbs, non-prescription drugs, or dietary supplements you use. Also tell them if you smoke, drink alcohol, or use illegal drugs. Some items may interact with your medicine. What should I watch for while using this medication? Your condition will be monitored carefully while you are receiving this medication. You may need blood work while taking this medication. This medication may make you feel generally unwell. This is not uncommon as chemotherapy can affect healthy cells as well as cancer cells. Report any side effects. Continue your course of treatment even though you feel ill unless your care team tells you to stop. This medication can cause serious side effects. To reduce the risk, your care team may give you other medications to take before receiving this one. Be sure to follow the directions from your care team. This medication   may affect your coordination, reaction time, or judgement. Do not drive or operate machinery until you know how this medication affects you. Sit up or stand slowly to reduce the risk of dizzy or fainting spells. Drinking alcohol with this medication can increase the risk of these side effects. This medication may increase your risk of getting an infection. Call your care team for advice if you get a fever, chills, sore throat, or other symptoms of a cold or flu. Do not treat yourself. Try to avoid being around people who are sick. Avoid taking medications that contain aspirin, acetaminophen, ibuprofen, naproxen, or ketoprofen unless instructed by your care team. These medications may hide a fever. This medication may  increase your risk to bruise or bleed. Call your care team if you notice any unusual bleeding. Be careful brushing or flossing your teeth or using a toothpick because you may get an infection or bleed more easily. If you have any dental work done, tell your dentist you are receiving this medication. Talk to your care team if you or your partner are pregnant or think either of you might be pregnant. This medication can cause serious birth defects if taken during pregnancy and for 6 months after the last dose. You will need a negative pregnancy test before starting this medication. Contraception is recommended while taking this medication and for 6 months after the last dose. Your care team can help you find the option that works for you. Do not father a child while taking this medication and for 3 months after the last dose. Use a condom for contraception during this time period. Do not breastfeed while taking this medication and for 7 days after the last dose. This medication may cause infertility. Talk to your care team if you are concerned about your fertility. What side effects may I notice from receiving this medication? Side effects that you should report to your care team as soon as possible: Allergic reactions--skin rash, itching, hives, swelling of the face, lips, tongue, or throat Dry cough, shortness of breath or trouble breathing Increased saliva or tears, increased sweating, stomach cramping, diarrhea, small pupils, unusual weakness or fatigue, slow heartbeat Infection--fever, chills, cough, sore throat, wounds that don't heal, pain or trouble when passing urine, general feeling of discomfort or being unwell Kidney injury--decrease in the amount of urine, swelling of the ankles, hands, or feet Low red blood cell level--unusual weakness or fatigue, dizziness, headache, trouble breathing Severe or prolonged diarrhea Unusual bruising or bleeding Side effects that usually do not require  medical attention (report to your care team if they continue or are bothersome): Constipation Diarrhea Hair loss Loss of appetite Nausea Stomach pain This list may not describe all possible side effects. Call your doctor for medical advice about side effects. You may report side effects to FDA at 1-800-FDA-1088. Where should I keep my medication? This medication is given in a hospital or clinic. It will not be stored at home. NOTE: This sheet is a summary. It may not cover all possible information. If you have questions about this medicine, talk to your doctor, pharmacist, or health care provider.  2023 Elsevier/Gold Standard (2022-02-09 00:00:00)   Magnesium Sulfate Injection What is this medication? MAGNESIUM SULFATE (mag NEE zee um SUL fate) prevents and treats low levels of magnesium in your body. It may also be used to prevent and treat seizures during pregnancy in people with high blood pressure disorders, such as preeclampsia or eclampsia. Magnesium plays an important role in   maintaining the health of your muscles and nervous system. This medicine may be used for other purposes; ask your health care provider or pharmacist if you have questions. What should I tell my care team before I take this medication? They need to know if you have any of these conditions: Heart disease History of irregular heart beat Kidney disease An unusual or allergic reaction to magnesium sulfate, medications, foods, dyes, or preservatives Pregnant or trying to get pregnant Breast-feeding How should I use this medication? This medication is for infusion into a vein. It is given in a hospital or clinic setting. Talk to your care team about the use of this medication in children. While this medication may be prescribed for selected conditions, precautions do apply. Overdosage: If you think you have taken too much of this medicine contact a poison control center or emergency room at once. NOTE: This  medicine is only for you. Do not share this medicine with others. What if I miss a dose? This does not apply. What may interact with this medication? Certain medications for anxiety or sleep Certain medications for seizures, such phenobarbital Digoxin Medications that relax muscles for surgery Narcotic medications for pain This list may not describe all possible interactions. Give your health care provider a list of all the medicines, herbs, non-prescription drugs, or dietary supplements you use. Also tell them if you smoke, drink alcohol, or use illegal drugs. Some items may interact with your medicine. What should I watch for while using this medication? Your condition will be monitored carefully while you are receiving this medication. You may need blood work done while you are receiving this medication. What side effects may I notice from receiving this medication? Side effects that you should report to your care team as soon as possible: Allergic reactions--skin rash, itching, hives, swelling of the face, lips, tongue, or throat High magnesium level--confusion, drowsiness, facial flushing, redness, sweating, muscle weakness, fast or irregular heartbeat, trouble breathing Low blood pressure--dizziness, feeling faint or lightheaded, blurry vision Side effects that usually do not require medical attention (report to your care team if they continue or are bothersome): Headache Nausea This list may not describe all possible side effects. Call your doctor for medical advice about side effects. You may report side effects to FDA at 1-800-FDA-1088. Where should I keep my medication? This medication is given in a hospital or clinic and will not be stored at home. NOTE: This sheet is a summary. It may not cover all possible information. If you have questions about this medicine, talk to your doctor, pharmacist, or health care provider.  2023 Elsevier/Gold Standard (2013-02-07 00:00:00)    

## 2023-02-27 NOTE — Progress Notes (Signed)
Hematology/Oncology Progress note [phone] [phone]) [phone]      CHIEF COMPLAINTS/REASON FOR VISIT:  Follow up for rectal cancer  ASSESSMENT & PLAN:   Cancer Staging  Rectal cancer [name]) Staging form: Colon and Rectum, AJCC 8th Edition - Pathologic stage from 10/06/2019: Stage IIC (ypT4b, pN0, cM0) - Signed by Jacqueline Patience, MD on 10/06/2019 - Clinical stage from 06/30/2020: Jacqueline Yoder - Signed by Jacqueline Patience, MD on 04/17/2022 - Pathologic: Stage Unknown (rpTX, pNX, cM1) - Signed by Jacqueline Patience, MD on 01/31/2021   Rectal cancer [name]) History of stage IIIC Rectal cancer, s/p TNT, followed by 09/17/19 APR/posterior vaginectomy/TAH/BSO/VY-flap, pT4b pN0 with close vaginal margin 0.2 mm.  Uterus and ovaries negative for malignancy. palliative radiation to vaginal recurrence- 01/19/21 recurrence with lung metastasis.-Palliative -FOLFIRI plus bevacizumab.  Irinotecan was dropped in November 2022 due to Yoder effects. Negative for UGT1A1*28 - radiographically stable, rise of CEA-July 2023 CT lung metastasis worse--> Dec 2023 PET showed progression in pelvic lymph nodes and bone lesions,--> 2nd line irinotecan +panitumumab-->CT March 2024 stable.  Labs are reviewed and discussed with patient, CEA nadir was in 500s, currently gradually increasing.  Proceed with irinotecan +panitumumab   Encounter for antineoplastic chemotherapy Chemotherapy plan as listed above.   Anemia due to antineoplastic chemotherapy Hb stable observation  History of pulmonary embolism Continue  Eliquis 2.5 mg twice daily for anticoagulation prophylaxis.  Chemotherapy induced diarrhea Stable, continue lomotil  QID PRN as instructed.   Hypokalemia  potassium is stable.  Continue potassium chloride 20 mEq  daily.   Hypomagnesemia IV Mag 2g PRN Mag <=1.6.  Continue slow mag orally 64mg  twice daily.   Chemotherapy induced neutropenia (HCC) patient will receive long-acting GCSF prophylaxis on D3  Vaginal  spotting Patient will see gynecology oncology for further evaluation. Possible recurrent rectal cancer involving vagina. If we are able to get additional tissue, will send NGS.   Follow up 2 weeks Lab MD irinotecan + panitumumab All questions were answered. The patient knows to call the clinic with any problems, questions or concerns.  Jacqueline Patience, MD, PhD [name] Hematology Oncology 02/27/2023      HISTORY OF PRESENTING ILLNESS:  Patient initially presented with complaints of postmenopausal bleeding on 08/16/2018.  History of was menopausal vaginal bleeding in 2016 which resulted in cervical polypectomy.  Pathology 02/04/2015 showed cervical polyp, consistent with benign endometrial polyp.  [name] after polypectomy due to anxiety associated with pelvic exams.  pelvic exam on 08/16/2018 reviewed cervical abnormality and from enlarged uterus. Seen by Dr. Valentino Yoder on 10/29/2018.  Endometrial biopsy and a Pap smear was performed. 10/29/2018 Pap smear showed adenocarcinoma, favor endometrial origin. 10/29/2018 endometrial biopsy showed endometrioid carcinoma, FIGO grade 1.  10/29/2018- TA & TV Ultrasound revealed: Anteverted uterus measuring 8.7 x 5.6 x 6.4 cm without evidence of focal masses.  The endometrium measuring 24.1 mm (thickened) and heterogeneous.  Right and left ovaries not visualized.  No adnexal masses identified.  No free fluid in cul-de-sac.  Patient was seen by Jacqueline Yoder in clinic on 11/13/2018.  Cervical exam reveals 2 cm exophytic irregular mass consistent with malignancy.   11/19/2018 CT chest abdomen pelvis with contrast showed thickened endometrium with some irregularity compatible with the provided diagnosis of endometrial malignancy.  There is a mildly prominent left inguinal node 1.4 cm.  Patient was seen by Dr. Johnnette Yoder on 11/20/2018 and left groin lymph node biopsy was recommended.  11/26/2018 patient underwent left inguinal lymph node biopsy. Pathology showed  metastatic  adenocarcinoma consistent with colorectal origin.  CDX 2+.  Case was discussed on tumor board.  Recommend colonoscopy for further evaluation.  Patient reports significant weight loss 30 pounds over the last year.  Chronic vaginal spotting. Change of bowel habits the past few months.  More constipated.  Family history positive for brother who has colon cancer prostate cancer.  patient has underwent colonoscopy on 12/03/2018 which reviewed a nonobstructing large mass in the rectum.  Also chronic fistula.  Mass was not circumferential.  This was biopsied with a cold forceps for histology.  Pathology came back hyperplastic polyp negative for dysplasia and malignancy. [name], patient underwent flex sigmoidoscopy on 12/06/2018 with rebiopsy of the rectal mass. This time biopsy results came back positive for invasive colorectal adenocarcinoma, moderately differentiated. Immunotherapy for nearly mismatch repair protein (MMR ) was performed.  There is no loss of MMR expression.  low probability of MSI high.   # Seen by [name] for evaluation of resectability for rectal cancer. In addition, she also had a second opinion with [facility] pathology where her endometrial biopsy pathology was changed to  adenocarcinoma, consistent with colorectal primary.   [name]. She has home health that has been assisting with ostomy care  Patient was also evaluated by [name] oncology.  Recommendation is to proceed with TNT with concurrent chemoradiation followed by neoadjuvant chemotherapy followed by surgical resection. Patient prefers to have treatment done locally with [name].   # Oncology Treatment:  02/03/2019- 03/19/2019  concurrent Xeloda and radiation.  Xeloda dose 825mg  /m2 BID - rounded to 1650mg  BID- on days of radiation. 04/09/2019, started on FOLFOX with bolus early.  Omitted.  07/16/2019 finished 8 cycles of FOLFOX. 09/17/19 APR/posterior  vaginectomy/TAH/BSO/VY-flap pT4b pN0 with close vaginal margin 0.2 mm.  Uterus and ovaries negative for malignancy. Patient reports bilateral lower extremity numbness and tingling, intermittent, left worse than right. She has lost a lot of weight since her APR surgery.   #Family history with half brother having's history of colon cancer prostate cancer.  Personal history of colorectal cancer.  Patient has not decided if she wants genetic testing.    # history of PE( 01/13/2020)  in the bilateral lower extremity DVT (01/13/2020).   She finishes 6 months of anticoagulation with Eliquis 5 mg twice daily. Now switched to Eliquis 2.5 mg twice daily..  # She has now developed recurrent disease. #06/30/20  vaginal introitus mass biopsied. Pathology is consistent with metastatic colorectal adenocarcinoma I have discussed with [name]  Dr. Luciano Cutter and the mass is not resectable. Patient has also had colonoscopy by Dr. Tobi Bastos yesterday. Normal examination. # 07/16/2020 cycle 1 FOLFIRI  # 07/20/2020 PET scan was done for further evaluation, images are consistent with local recurrence, no distant metastasis. #Discussed with radiation oncology Dr. Rushie Chestnut will recommends concurrent chemotherapy and radiation. 08/02/2020-08/16/2020, patient starts radiation.  Xeloda was held due to neutropenia 08/17/2020,-09/06/2020 Xeloda 1500 mg twice daily concurrently with radiation  01/31/21 started on FOLFIRI + Bev 05/18/2021 CT chest abdomen pelvis showed Previously noted enlargement of bilateral inguinal lymph nodes is resolved, consistent with treatment response of nodal metastatic disease. Interval decrease in size of multiple small bilateral pulmonary nodules, consistent with treatment response of pulmonary metastatic disease. No evidence of new metastatic disease. 05/24/2021 - 08/30/2021, continued on FOLFIRI plus bevacizumab.  Irinotecan dose was reduced, eventually 100mg /m2  09/02/2021, CT chest abdomen pelvis without  contrast Showed small bilateral pulmonary nodules, unchanged.  Stable metastatic  disease.  No noncontrast evidence of new metastatic disease in the chest abdomen pelvis.  Small parastomal hernia.  Enlargement of main pulmonary artery.  Coronary artery disease.  09/13/2021, maintenance 5-FU/bevacizumab 11/28/2021, 5-FU/Irinotecan/bevacizumab.  Irinotecan 100 mg/m2 was added back due to progressively increasing CEA.    INTERVAL HISTORY Jacqueline Yoder is a 77 y.o. female who has above history reviewed by me presents for follow-up of rectal cancer. On Irinotecan and panitumumab. she tolerates well.  She was accompanied by her daughter. - chronic diarrhea is manageable.  -Patient noticed very small amount of vaginal spotting since last visit.  Review of Systems  Constitutional:  Positive for fatigue. Negative for appetite change, chills, fever and unexpected weight change.  HENT:   Negative for hearing loss and voice change.   Eyes:  Negative for eye problems.  Respiratory:  Negative for chest tightness and cough.   Cardiovascular:  Negative for chest pain.  Gastrointestinal:  Negative for abdominal distention, abdominal pain, blood in stool, constipation, diarrhea and nausea.  Endocrine: Negative for hot flashes.  Genitourinary:  Negative for difficulty urinating and frequency.   Musculoskeletal:  Positive for arthralgias.  Skin:  Positive for rash. Negative for itching.  Neurological:  Negative for extremity weakness and numbness.  Hematological:  Negative for adenopathy.  Psychiatric/Behavioral:  Negative for confusion.     MEDICAL HISTORY:  Past Medical History:  Diagnosis Date   Allergy    Arthritis    Blood clot in vein    Family history of colon cancer    GERD (gastroesophageal reflux disease)    Hypercholesteremia    Hypertension    Hypertension    Lower extremity edema    Personal history of chemotherapy    Rectal cancer (HCC) 12/2018   Urinary incontinence      SURGICAL HISTORY: Past Surgical History:  Procedure Laterality Date   ABDOMINAL HYSTERECTOMY     CHOLECYSTECTOMY  1971   COLONOSCOPY WITH PROPOFOL N/A 12/03/2018   Procedure: COLONOSCOPY WITH PROPOFOL;  Surgeon: Midge Minium, MD;  Location: [facility] ENDOSCOPY;  Service: Endoscopy;  Laterality: N/A;   COLONOSCOPY WITH PROPOFOL N/A 07/15/2020   Procedure: COLONOSCOPY WITH PROPOFOL;  Surgeon: Wyline Mood, MD;  Location: [facility] ENDOSCOPY;  Service: Gastroenterology;  Laterality: N/A;   FLEXIBLE SIGMOIDOSCOPY N/A 12/06/2018   Procedure: FLEXIBLE SIGMOIDOSCOPY;  Surgeon: Wyline Mood, MD;  Location: [facility] ENDOSCOPY;  Service: Endoscopy;  Laterality: N/A;   LAPAROSCOPIC COLOSTOMY  01/06/2019   PORTACATH PLACEMENT N/A 04/03/2019   Procedure: INSERTION PORT-A-CATH;  Surgeon: Leafy Ro, MD;  Location: [facility] ORS;  Service: General;  Laterality: N/A;    SOCIAL HISTORY: Social History   Socioeconomic History   Marital status: Widowed    Spouse name: Not on file   Number of children: Not on file   Years of education: Not on file   Highest education level: Not on file  Occupational History   Not on file  Tobacco Use   Smoking status: Former    Types: Cigarettes    Quit date: 12/02/1977    Years since quitting: 45.2   Smokeless tobacco: Former  Building services engineer Use: Never used  Substance and Sexual Activity   Alcohol use: Never   Drug use: Never   Sexual activity: Not Currently    Birth control/protection: None  Other Topics Concern   Not on file  Social History Narrative   Lives with daughter   Social Determinants of Health   Financial Resource Strain: Medium Risk (06/30/2022)  Overall Financial Resource Strain (CARDIA)    Difficulty of Paying Living Expenses: Somewhat hard  Food Insecurity: No Food Insecurity (06/30/2022)   Hunger Vital Sign    Worried About Running Out of Food in the Last Year: Never true    Ran Out of Food in the Last Year: Never true  Transportation Needs:  No Transportation Needs (06/30/2022)   PRAPARE - Administrator, Civil Service (Medical): No    Lack of Transportation (Non-Medical): No  Physical Activity: Inactive (06/30/2022)   Exercise Vital Sign    Days of Exercise per Week: 0 days    Minutes of Exercise per Session: 0 min  Stress: No Stress Concern Present (06/30/2022)   [employer] of Occupational Health - Occupational Stress Questionnaire    Feeling of Stress : Not at all  Social Connections: Moderately Isolated (06/30/2022)   Social Connection and Isolation Panel [NHANES]    Frequency of Communication with Friends and Family: More than three times a week    Frequency of Social Gatherings with Friends and Family: Twice a week    Attends Religious Services: More than 4 times per year    Active Member of [employer] or Organizations: No    Attends Banker Meetings: Never    Marital Status: Widowed  Intimate Partner Violence: Not At Risk (06/30/2022)   Humiliation, Afraid, Rape, and Kick questionnaire    Fear of Current or Ex-Partner: No    Emotionally Abused: No    Physically Abused: No    Sexually Abused: No    FAMILY HISTORY: Family History  Problem Relation Age of Onset   Colon cancer Brother 20       exposure to chemicals [place]   Hypertension Mother    Stroke Mother    Kidney failure Father    Breast cancer Neg Hx    Ovarian cancer Neg Hx     ALLERGIES:  is allergic to sulfamethoxazole-trimethoprim.  MEDICATIONS:  Current Outpatient Medications  Medication Sig Dispense Refill   Cholecalciferol (VITAMIN D3) 2000 units capsule Take 2,000 Units by mouth daily.     cholestyramine (QUESTRAN) 4 g packet Take 1 packet (4 g total) by mouth 3 (three) times daily. 90 each 1   clindamycin (CLINDAGEL) 1 % gel Apply topically 2 (two) times daily. 30 g 5   diclofenac sodium (VOLTAREN) 1 % GEL Apply 2 g topically 4 (four) times daily as needed (joint pain).  11   diphenoxylate-atropine (LOMOTIL)  2.5-0.025 MG tablet TAKE 1 TABLET BY MOUTH 4 TIMES DAILY AS NEEDED FOR  DIARRHEA  OR  LOOSE  STOOLS 60 tablet 0   ELIQUIS 2.5 MG TABS tablet TAKE 1 TABLET BY MOUTH TWICE  DAILY 200 tablet 2   fluticasone (FLONASE) 50 MCG/ACT nasal spray USE 1 SPRAY IN EACH NOSTRIL ONCE DAILY 16 g 3   ipratropium (ATROVENT) 0.03 % nasal spray Place 1 spray into both nostrils 2 (two) times daily.     ketoconazole (NIZORAL) 2 % cream Apply 1 Application topically daily. For up to 2 weeks. May repeat if need. 30 g 2   lidocaine-prilocaine (EMLA) cream Apply 1 application. topically as needed. 30 g 6   loperamide (IMODIUM) 2 MG capsule Take 1 capsule (2 mg total) by mouth See admin instructions. With onset of loose stool, take 4mg  followed by 2mg  every 2 hours,  Maximum: 16 mg/day 120 capsule 1   loratadine (CLARITIN) 10 MG tablet Take 10 mg by mouth daily.  magnesium chloride (SLOW-MAG) 64 MG TBEC SR tablet Take 1 tablet (64 mg total) by mouth 2 (two) times daily. 30 tablet 1   Multiple Vitamins-Minerals (ONE-A-DAY WOMENS 50 PLUS PO) Take 1 tablet by mouth daily.      nystatin (MYCOSTATIN/NYSTOP) powder Apply 1 Application topically 3 (three) times daily. 45 g 0   potassium chloride SA (KLOR-CON M) 20 MEQ tablet Take 1 tablet (20 mEq total) by mouth daily. 100 tablet 1   simvastatin (ZOCOR) 40 MG tablet Take 1 tablet (40 mg total) by mouth at bedtime. 100 tablet 3   triamterene-hydrochlorothiazide (DYAZIDE) 37.5-25 MG capsule Take 1 each (1 capsule total) by mouth daily. 100 capsule 3   zinc gluconate 50 MG tablet Take 50 mg by mouth daily.     triamcinolone cream (KENALOG) 0.5 % Apply 1 Application topically 2 (two) times daily. To affected areas, for up to 2 weeks. (Patient not taking: Reported on 02/22/2023) 30 g 2   No current facility-administered medications for this visit.   Facility-Administered Medications Ordered in Other Visits  Medication Dose Route Frequency Provider Last Rate Last Admin    dexamethasone (DECADRON) 10 mg in sodium chloride 0.9 % 50 mL IVPB  10 mg Intravenous Once Jacqueline Patience, MD       heparin lock flush 100 unit/mL  500 Units Intracatheter Once PRN Jacqueline Patience, MD       irinotecan (CAMPTOSAR) 180 mg in sodium chloride 0.9 % 500 mL chemo infusion  100 mg/m2 (Treatment Plan Recorded) Intravenous Once Jacqueline Patience, MD 339 mL/hr at 02/27/23 1112 180 mg at 02/27/23 1112   sodium chloride flush (NS) 0.9 % injection 10 mL  10 mL Intracatheter PRN Jacqueline Patience, MD         PHYSICAL EXAMINATION: ECOG PERFORMANCE STATUS: 1 - Symptomatic but completely ambulatory  Physical Exam Constitutional:      General: She is not in acute distress. HENT:     Head: Normocephalic and atraumatic.  Eyes:     General: No scleral icterus. Cardiovascular:     Rate and Rhythm: Normal rate.     Heart sounds: Normal heart sounds.  Pulmonary:     Effort: Pulmonary effort is normal. No respiratory distress.     Breath sounds: No wheezing.  Abdominal:     General: Bowel sounds are normal. There is no distension.     Palpations: Abdomen is soft.     Comments: + Colostomy bag   Musculoskeletal:        General: No deformity. Normal range of motion.     Cervical back: Normal range of motion and neck supple.  Skin:    General: Skin is warm and dry.  Neurological:     Mental Status: She is alert and oriented to person, place, and time. Mental status is at baseline.     Cranial Nerves: No cranial nerve deficit.     Coordination: Coordination normal.       LABORATORY DATA:  I have reviewed the data as listed    Latest Ref Rng & Units 02/27/2023    8:38 AM 02/13/2023    8:32 AM 01/30/2023    8:15 AM  CBC  WBC 4.0 - 10.5 K/uL 5.9  5.3  3.4   Hemoglobin 12.0 - 15.0 g/dL 16.1  09.6  04.5   Hematocrit 36.0 - 46.0 % 37.3  34.9  34.4   Platelets 150 - 400 K/uL 215  176  243       Latest Ref  Rng & Units 02/27/2023    8:38 AM 02/13/2023    8:32 AM 01/30/2023    8:15 AM  CMP  Glucose 70 - 99 mg/dL 94   97  93   BUN 8 - 23 mg/dL 18  14  14    Creatinine 0.44 - 1.00 mg/dL 1.61  0.96  0.45   Sodium 135 - 145 mmol/L 140  137  138   Potassium 3.5 - 5.1 mmol/L 3.8  3.6  3.5   Chloride 98 - 111 mmol/L 103  104  104   CO2 22 - 32 mmol/L 28  25  26    Calcium 8.9 - 10.3 mg/dL 8.7  8.6  8.7   Total Protein 6.5 - 8.1 g/dL 6.6  6.3  6.5   Total Bilirubin 0.3 - 1.2 mg/dL 0.4  0.4  0.5   Alkaline Phos 38 - 126 U/L 79  68  58   AST 15 - 41 U/L 37  31  37   ALT 0 - 44 U/L 24  19  23       RADIOGRAPHIC STUDIES: I have personally reviewed the radiological images as listed and agreed with the findings in the report. MM 3D SCREEN BREAST BILATERAL  Result Date: 01/22/2023 CLINICAL DATA:  Screening. EXAM: DIGITAL SCREENING BILATERAL MAMMOGRAM WITH TOMOSYNTHESIS AND CAD TECHNIQUE: Bilateral screening digital craniocaudal and mediolateral oblique mammograms were obtained. Bilateral screening digital breast tomosynthesis was performed. The images were evaluated with computer-aided detection. COMPARISON:  Previous exam(s). ACR Breast Density Category b: There are scattered areas of fibroglandular density. FINDINGS: There are no findings suspicious for malignancy. IMPRESSION: No mammographic evidence of malignancy. A result letter of this screening mammogram will be mailed directly to the patient. RECOMMENDATION: Screening mammogram in one year. (Code:SM-B-01Y) BI-RADS CATEGORY  1: Negative. Electronically Signed   By: Bary Richard M.D.   On: 01/22/2023 11:01   CT CHEST ABDOMEN PELVIS W CONTRAST  Result Date: 12/26/2022 CLINICAL DATA:  Metastatic rectal adenocarcinoma; * Tracking Code: BO * EXAM: CT CHEST, ABDOMEN, AND PELVIS WITH CONTRAST TECHNIQUE: Multidetector CT imaging of the chest, abdomen and pelvis was performed following the standard protocol during bolus administration of intravenous contrast. RADIATION DOSE REDUCTION: This exam was performed according to the departmental dose-optimization program which  includes automated exposure control, adjustment of the mA and/or kV according to patient size and/or use of iterative reconstruction technique. CONTRAST:  OMNIPAQUE IOHEXOL 300 MG/ML  SOLN COMPARISON:  PET-CT dated September 19, 2022 FINDINGS: CT CHEST FINDINGS Cardiovascular: Normal heart size. No pericardial effusion. Normal caliber thoracic aorta mild atherosclerotic disease. Right chest wall port with tip in the right atrium. Mild coronary artery calcifications. Mediastinum/Nodes: Esophagus and thyroid are unremarkable. No pathologically enlarged lymph nodes seen in the chest. Lungs/Pleura: Central airways are patent. Scattered small solid pulmonary nodules stable. Reference irregular solid pulmonary nodule of the right middle lobe measuring 10 x 7 mm on series 4, image 102, unchanged when compared with the prior exam and remeasured in similar plane. Musculoskeletal: Osseous lesion of the proximal right humerus demonstrates increased sclerosis when compared with the prior PET-CT possibly due to treatment response. CT ABDOMEN PELVIS FINDINGS Hepatobiliary: No suspicious liver lesion. Mild intra and extrahepatic biliary ductal dilation, unchanged when compared with the prior exams. Pancreas: Unremarkable. No pancreatic ductal dilatation or surrounding inflammatory changes. Spleen: Normal in size without focal abnormality. Adrenals/Urinary Tract: Bilateral adrenal glands are unremarkable. No hydronephrosis or nephrolithiasis. Bilateral simple appearing renal cysts, no specific follow-up imaging is  recommended for these lesions. No suspicious renal lesions. Stomach/Bowel: Postsurgical changes of prior APR with left lower quadrant colostomy. Normal appearance of the stomach. Normal appendix. No evidence of obstruction. Vascular/Lymphatic: Aortic atherosclerosis. Stable size of right inguinal and right pelvic lymph nodes. Reference right inguinal lymph node measuring 11 mm on series 2, image 105. Reference right  inguinal lymph node measuring 6 mm on series 2, image 98, not significantly changed in size. Reproductive: No adnexal mass. Other: No abdominopelvic ascites. Musculoskeletal: Osseous irregularity of the left iliac bone, unchanged when compared with prior exam. IMPRESSION: 1. Stable scattered solid pulmonary nodules. 2. Right inguinal and right pelvic lymph nodes which were hypermetabolic on prior PET-CT are stable. 3. Osseous lesion of the proximal right humerus demonstrates increased sclerosis when compared with the prior PET-CT, possibly due to treatment response. 4. 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 5. Aortic Atherosclerosis (ICD10-I70.0). Electronically Signed   By: Allegra Lai M.D.   On: 12/26/2022 09:05      MM 3D SCREEN BREAST BILATERAL  Result Date: 01/22/2023 CLINICAL DATA:  Screening. EXAM: DIGITAL SCREENING BILATERAL MAMMOGRAM WITH TOMOSYNTHESIS AND CAD TECHNIQUE: Bilateral screening digital craniocaudal and mediolateral oblique mammograms were obtained. Bilateral screening digital breast tomosynthesis was performed. The images were evaluated with computer-aided detection. COMPARISON:  Previous exam(s). ACR Breast Density Category b: There are scattered areas of fibroglandular density. FINDINGS: There are no findings suspicious for malignancy. IMPRESSION: No mammographic evidence of malignancy. A result letter of this screening mammogram will be mailed directly to the patient. RECOMMENDATION: Screening mammogram in one year. (Code:SM-B-01Y) BI-RADS CATEGORY  1: Negative. Electronically Signed   By: Bary Richard M.D.   On: 01/22/2023 11:01   CT CHEST ABDOMEN PELVIS W CONTRAST  Result Date: 12/26/2022 CLINICAL DATA:  Metastatic rectal adenocarcinoma; * Tracking Code: BO * EXAM: CT CHEST, ABDOMEN, AND PELVIS WITH CONTRAST TECHNIQUE: Multidetector CT imaging of the chest, abdomen and pelvis was performed following the standard protocol during bolus administration of  intravenous contrast. RADIATION DOSE REDUCTION: This exam was performed according to the departmental dose-optimization program which includes automated exposure control, adjustment of the mA and/or kV according to patient size and/or use of iterative reconstruction technique. CONTRAST:  OMNIPAQUE IOHEXOL 300 MG/ML  SOLN COMPARISON:  PET-CT dated September 19, 2022 FINDINGS: CT CHEST FINDINGS Cardiovascular: Normal heart size. No pericardial effusion. Normal caliber thoracic aorta mild atherosclerotic disease. Right chest wall port with tip in the right atrium. Mild coronary artery calcifications. Mediastinum/Nodes: Esophagus and thyroid are unremarkable. No pathologically enlarged lymph nodes seen in the chest. Lungs/Pleura: Central airways are patent. Scattered small solid pulmonary nodules stable. Reference irregular solid pulmonary nodule of the right middle lobe measuring 10 x 7 mm on series 4, image 102, unchanged when compared with the prior exam and remeasured in similar plane. Musculoskeletal: Osseous lesion of the proximal right humerus demonstrates increased sclerosis when compared with the prior PET-CT possibly due to treatment response. CT ABDOMEN PELVIS FINDINGS Hepatobiliary: No suspicious liver lesion. Mild intra and extrahepatic biliary ductal dilation, unchanged when compared with the prior exams. Pancreas: Unremarkable. No pancreatic ductal dilatation or surrounding inflammatory changes. Spleen: Normal in size without focal abnormality. Adrenals/Urinary Tract: Bilateral adrenal glands are unremarkable. No hydronephrosis or nephrolithiasis. Bilateral simple appearing renal cysts, no specific follow-up imaging is recommended for these lesions. No suspicious renal lesions. Stomach/Bowel: Postsurgical changes of prior APR with left lower quadrant colostomy. Normal appearance of the stomach. Normal appendix. No evidence of obstruction. Vascular/Lymphatic: Aortic  atherosclerosis. Stable size of  right inguinal and right pelvic lymph nodes. Reference right inguinal lymph node measuring 11 mm on series 2, image 105. Reference right inguinal lymph node measuring 6 mm on series 2, image 98, not significantly changed in size. Reproductive: No adnexal mass. Other: No abdominopelvic ascites. Musculoskeletal: Osseous irregularity of the left iliac bone, unchanged when compared with prior exam. IMPRESSION: 1. Stable scattered solid pulmonary nodules. 2. Right inguinal and right pelvic lymph nodes which were hypermetabolic on prior PET-CT are stable. 3. Osseous lesion of the proximal right humerus demonstrates increased sclerosis when compared with the prior PET-CT, possibly due to treatment response. 4. 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 5. Aortic Atherosclerosis (ICD10-I70.0). Electronically Signed   By: Allegra Lai M.D.   On: 12/26/2022 09:05

## 2023-02-27 NOTE — Assessment & Plan Note (Signed)
potassium is stable.  Continue potassium chloride 20 mEq  daily.  

## 2023-02-27 NOTE — Assessment & Plan Note (Addendum)
Hb stable observation

## 2023-02-27 NOTE — Assessment & Plan Note (Addendum)
History of stage IIIC Rectal cancer, s/p TNT, followed by 09/17/19 APR/posterior vaginectomy/TAH/BSO/VY-flap, pT4b pN0 with close vaginal margin 0.2 mm.  Uterus and ovaries negative for malignancy. palliative radiation to vaginal recurrence- 01/19/21 recurrence with lung metastasis.-Palliative -FOLFIRI plus bevacizumab.  Irinotecan was dropped in November 2022 due to side effects. Negative for UGT1A1*28 - radiographically stable, rise of CEA-July 2023 CT lung metastasis worse--> Dec 2023 PET showed progression in pelvic lymph nodes and bone lesions,--> 2nd line irinotecan +panitumumab-->CT March 2024 stable.  Labs are reviewed and discussed with patient, CEA nadir was in 500s, currently gradually increasing.  Proceed with irinotecan +panitumumab

## 2023-02-27 NOTE — Assessment & Plan Note (Signed)
patient will receive long-acting GCSF prophylaxis on D3 

## 2023-02-27 NOTE — Assessment & Plan Note (Signed)
Continue  Eliquis 2.5 mg twice daily for anticoagulation prophylaxis. 

## 2023-02-27 NOTE — Assessment & Plan Note (Signed)
IV Mag 2g PRN Mag <=1.6.  Continue slow mag orally 64mg twice daily.  

## 2023-02-27 NOTE — Assessment & Plan Note (Signed)
Stable, continue lomotil  QID PRN as instructed.  

## 2023-02-27 NOTE — Assessment & Plan Note (Signed)
Chemotherapy plan as listed above 

## 2023-02-27 NOTE — Assessment & Plan Note (Signed)
Patient will see gynecology oncology for further evaluation. Possible recurrent rectal cancer involving vagina. If we are able to get additional tissue, will send NGS.

## 2023-02-28 LAB — CEA: CEA: 698 ng/mL — ABNORMAL HIGH (ref 0.0–4.7)

## 2023-03-01 ENCOUNTER — Inpatient Hospital Stay: Payer: 59

## 2023-03-01 DIAGNOSIS — Z800 Family history of malignant neoplasm of digestive organs: Secondary | ICD-10-CM | POA: Diagnosis not present

## 2023-03-01 DIAGNOSIS — Z7901 Long term (current) use of anticoagulants: Secondary | ICD-10-CM | POA: Diagnosis not present

## 2023-03-01 DIAGNOSIS — Z933 Colostomy status: Secondary | ICD-10-CM | POA: Diagnosis not present

## 2023-03-01 DIAGNOSIS — T451X5A Adverse effect of antineoplastic and immunosuppressive drugs, initial encounter: Secondary | ICD-10-CM | POA: Diagnosis not present

## 2023-03-01 DIAGNOSIS — E876 Hypokalemia: Secondary | ICD-10-CM | POA: Diagnosis not present

## 2023-03-01 DIAGNOSIS — K219 Gastro-esophageal reflux disease without esophagitis: Secondary | ICD-10-CM | POA: Diagnosis not present

## 2023-03-01 DIAGNOSIS — Z86711 Personal history of pulmonary embolism: Secondary | ICD-10-CM | POA: Diagnosis not present

## 2023-03-01 DIAGNOSIS — K435 Parastomal hernia without obstruction or  gangrene: Secondary | ICD-10-CM | POA: Diagnosis not present

## 2023-03-01 DIAGNOSIS — Z87891 Personal history of nicotine dependence: Secondary | ICD-10-CM | POA: Diagnosis not present

## 2023-03-01 DIAGNOSIS — K5900 Constipation, unspecified: Secondary | ICD-10-CM | POA: Diagnosis not present

## 2023-03-01 DIAGNOSIS — I700 Atherosclerosis of aorta: Secondary | ICD-10-CM | POA: Diagnosis not present

## 2023-03-01 DIAGNOSIS — C799 Secondary malignant neoplasm of unspecified site: Secondary | ICD-10-CM

## 2023-03-01 DIAGNOSIS — R634 Abnormal weight loss: Secondary | ICD-10-CM | POA: Diagnosis not present

## 2023-03-01 DIAGNOSIS — Z5112 Encounter for antineoplastic immunotherapy: Secondary | ICD-10-CM | POA: Diagnosis not present

## 2023-03-01 DIAGNOSIS — E7800 Pure hypercholesterolemia, unspecified: Secondary | ICD-10-CM | POA: Diagnosis not present

## 2023-03-01 DIAGNOSIS — I129 Hypertensive chronic kidney disease with stage 1 through stage 4 chronic kidney disease, or unspecified chronic kidney disease: Secondary | ICD-10-CM | POA: Diagnosis not present

## 2023-03-01 DIAGNOSIS — Z79899 Other long term (current) drug therapy: Secondary | ICD-10-CM | POA: Diagnosis not present

## 2023-03-01 DIAGNOSIS — R92323 Mammographic fibroglandular density, bilateral breasts: Secondary | ICD-10-CM | POA: Diagnosis not present

## 2023-03-01 DIAGNOSIS — C20 Malignant neoplasm of rectum: Secondary | ICD-10-CM | POA: Diagnosis not present

## 2023-03-01 DIAGNOSIS — R197 Diarrhea, unspecified: Secondary | ICD-10-CM | POA: Diagnosis not present

## 2023-03-01 DIAGNOSIS — D701 Agranulocytosis secondary to cancer chemotherapy: Secondary | ICD-10-CM | POA: Diagnosis not present

## 2023-03-01 DIAGNOSIS — D6481 Anemia due to antineoplastic chemotherapy: Secondary | ICD-10-CM | POA: Diagnosis not present

## 2023-03-01 DIAGNOSIS — C7800 Secondary malignant neoplasm of unspecified lung: Secondary | ICD-10-CM | POA: Diagnosis not present

## 2023-03-01 DIAGNOSIS — I2510 Atherosclerotic heart disease of native coronary artery without angina pectoris: Secondary | ICD-10-CM | POA: Diagnosis not present

## 2023-03-01 MED ORDER — PEGFILGRASTIM INJECTION 6 MG/0.6ML ~~LOC~~
6.0000 mg | PREFILLED_SYRINGE | Freq: Once | SUBCUTANEOUS | Status: AC
  Administered 2023-03-01: 6 mg via SUBCUTANEOUS
  Filled 2023-03-01: qty 0.6

## 2023-03-04 ENCOUNTER — Other Ambulatory Visit: Payer: Self-pay | Admitting: Oncology

## 2023-03-05 ENCOUNTER — Encounter: Payer: Self-pay | Admitting: Oncology

## 2023-03-07 ENCOUNTER — Inpatient Hospital Stay (HOSPITAL_BASED_OUTPATIENT_CLINIC_OR_DEPARTMENT_OTHER): Payer: 59 | Admitting: Obstetrics and Gynecology

## 2023-03-07 VITALS — BP 126/56 | HR 93 | Temp 97.6°F | Resp 20 | Wt 156.8 lb

## 2023-03-07 DIAGNOSIS — D6481 Anemia due to antineoplastic chemotherapy: Secondary | ICD-10-CM | POA: Diagnosis not present

## 2023-03-07 DIAGNOSIS — R197 Diarrhea, unspecified: Secondary | ICD-10-CM | POA: Diagnosis not present

## 2023-03-07 DIAGNOSIS — C7951 Secondary malignant neoplasm of bone: Secondary | ICD-10-CM | POA: Diagnosis not present

## 2023-03-07 DIAGNOSIS — K435 Parastomal hernia without obstruction or  gangrene: Secondary | ICD-10-CM | POA: Diagnosis not present

## 2023-03-07 DIAGNOSIS — C20 Malignant neoplasm of rectum: Secondary | ICD-10-CM

## 2023-03-07 DIAGNOSIS — K5900 Constipation, unspecified: Secondary | ICD-10-CM | POA: Diagnosis not present

## 2023-03-07 DIAGNOSIS — E7800 Pure hypercholesterolemia, unspecified: Secondary | ICD-10-CM | POA: Diagnosis not present

## 2023-03-07 DIAGNOSIS — E876 Hypokalemia: Secondary | ICD-10-CM | POA: Diagnosis not present

## 2023-03-07 DIAGNOSIS — C7800 Secondary malignant neoplasm of unspecified lung: Secondary | ICD-10-CM | POA: Diagnosis not present

## 2023-03-07 DIAGNOSIS — D071 Carcinoma in situ of vulva: Secondary | ICD-10-CM

## 2023-03-07 DIAGNOSIS — Z86711 Personal history of pulmonary embolism: Secondary | ICD-10-CM | POA: Diagnosis not present

## 2023-03-07 DIAGNOSIS — D701 Agranulocytosis secondary to cancer chemotherapy: Secondary | ICD-10-CM | POA: Diagnosis not present

## 2023-03-07 DIAGNOSIS — C779 Secondary and unspecified malignant neoplasm of lymph node, unspecified: Secondary | ICD-10-CM

## 2023-03-07 DIAGNOSIS — T451X5A Adverse effect of antineoplastic and immunosuppressive drugs, initial encounter: Secondary | ICD-10-CM | POA: Diagnosis not present

## 2023-03-07 DIAGNOSIS — N393 Stress incontinence (female) (male): Secondary | ICD-10-CM

## 2023-03-07 DIAGNOSIS — Z5112 Encounter for antineoplastic immunotherapy: Secondary | ICD-10-CM | POA: Diagnosis not present

## 2023-03-07 DIAGNOSIS — R970 Elevated carcinoembryonic antigen [CEA]: Secondary | ICD-10-CM | POA: Diagnosis not present

## 2023-03-07 DIAGNOSIS — R634 Abnormal weight loss: Secondary | ICD-10-CM | POA: Diagnosis not present

## 2023-03-07 DIAGNOSIS — I129 Hypertensive chronic kidney disease with stage 1 through stage 4 chronic kidney disease, or unspecified chronic kidney disease: Secondary | ICD-10-CM | POA: Diagnosis not present

## 2023-03-07 DIAGNOSIS — I700 Atherosclerosis of aorta: Secondary | ICD-10-CM | POA: Diagnosis not present

## 2023-03-07 DIAGNOSIS — K219 Gastro-esophageal reflux disease without esophagitis: Secondary | ICD-10-CM | POA: Diagnosis not present

## 2023-03-07 DIAGNOSIS — Z79899 Other long term (current) drug therapy: Secondary | ICD-10-CM | POA: Diagnosis not present

## 2023-03-07 DIAGNOSIS — R92323 Mammographic fibroglandular density, bilateral breasts: Secondary | ICD-10-CM | POA: Diagnosis not present

## 2023-03-07 DIAGNOSIS — I2510 Atherosclerotic heart disease of native coronary artery without angina pectoris: Secondary | ICD-10-CM | POA: Diagnosis not present

## 2023-03-07 DIAGNOSIS — Z87891 Personal history of nicotine dependence: Secondary | ICD-10-CM | POA: Diagnosis not present

## 2023-03-07 DIAGNOSIS — Z7901 Long term (current) use of anticoagulants: Secondary | ICD-10-CM | POA: Diagnosis not present

## 2023-03-07 DIAGNOSIS — N9089 Other specified noninflammatory disorders of vulva and perineum: Secondary | ICD-10-CM

## 2023-03-07 DIAGNOSIS — Z800 Family history of malignant neoplasm of digestive organs: Secondary | ICD-10-CM | POA: Diagnosis not present
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
## 2023-03-08 DIAGNOSIS — Z933 Colostomy status: Secondary | ICD-10-CM | POA: Diagnosis not present

## 2023-03-09 LAB — MISC LABCORP TEST (SEND OUT): Labcorp test code: 139367

## 2023-03-09 MED FILL — Dexamethasone Sodium Phosphate Inj 100 MG/10ML: INTRAMUSCULAR | Qty: 1 | Status: AC

## 2023-03-13 ENCOUNTER — Inpatient Hospital Stay: Payer: 59

## 2023-03-13 ENCOUNTER — Inpatient Hospital Stay (HOSPITAL_BASED_OUTPATIENT_CLINIC_OR_DEPARTMENT_OTHER): Payer: 59 | Admitting: Oncology

## 2023-03-13 ENCOUNTER — Encounter: Payer: Self-pay | Admitting: Oncology

## 2023-03-13 ENCOUNTER — Ambulatory Visit: Payer: 59

## 2023-03-13 VITALS — BP 131/58 | HR 65 | Temp 98.3°F | Resp 18 | Wt 157.8 lb

## 2023-03-13 DIAGNOSIS — I129 Hypertensive chronic kidney disease with stage 1 through stage 4 chronic kidney disease, or unspecified chronic kidney disease: Secondary | ICD-10-CM | POA: Diagnosis not present

## 2023-03-13 DIAGNOSIS — C20 Malignant neoplasm of rectum: Secondary | ICD-10-CM

## 2023-03-13 DIAGNOSIS — R197 Diarrhea, unspecified: Secondary | ICD-10-CM | POA: Diagnosis not present

## 2023-03-13 DIAGNOSIS — K435 Parastomal hernia without obstruction or  gangrene: Secondary | ICD-10-CM | POA: Diagnosis not present

## 2023-03-13 DIAGNOSIS — T451X5A Adverse effect of antineoplastic and immunosuppressive drugs, initial encounter: Secondary | ICD-10-CM | POA: Diagnosis not present

## 2023-03-13 DIAGNOSIS — D701 Agranulocytosis secondary to cancer chemotherapy: Secondary | ICD-10-CM

## 2023-03-13 DIAGNOSIS — R634 Abnormal weight loss: Secondary | ICD-10-CM | POA: Diagnosis not present

## 2023-03-13 DIAGNOSIS — K521 Toxic gastroenteritis and colitis: Secondary | ICD-10-CM | POA: Diagnosis not present

## 2023-03-13 DIAGNOSIS — E7800 Pure hypercholesterolemia, unspecified: Secondary | ICD-10-CM | POA: Diagnosis not present

## 2023-03-13 DIAGNOSIS — Z79899 Other long term (current) drug therapy: Secondary | ICD-10-CM | POA: Diagnosis not present

## 2023-03-13 DIAGNOSIS — C799 Secondary malignant neoplasm of unspecified site: Secondary | ICD-10-CM | POA: Diagnosis not present

## 2023-03-13 DIAGNOSIS — K219 Gastro-esophageal reflux disease without esophagitis: Secondary | ICD-10-CM | POA: Diagnosis not present

## 2023-03-13 DIAGNOSIS — E876 Hypokalemia: Secondary | ICD-10-CM

## 2023-03-13 DIAGNOSIS — I700 Atherosclerosis of aorta: Secondary | ICD-10-CM | POA: Diagnosis not present

## 2023-03-13 DIAGNOSIS — N939 Abnormal uterine and vaginal bleeding, unspecified: Secondary | ICD-10-CM

## 2023-03-13 DIAGNOSIS — Z800 Family history of malignant neoplasm of digestive organs: Secondary | ICD-10-CM | POA: Diagnosis not present

## 2023-03-13 DIAGNOSIS — D6481 Anemia due to antineoplastic chemotherapy: Secondary | ICD-10-CM

## 2023-03-13 DIAGNOSIS — Z86711 Personal history of pulmonary embolism: Secondary | ICD-10-CM

## 2023-03-13 DIAGNOSIS — Z7901 Long term (current) use of anticoagulants: Secondary | ICD-10-CM | POA: Diagnosis not present

## 2023-03-13 DIAGNOSIS — C7800 Secondary malignant neoplasm of unspecified lung: Secondary | ICD-10-CM | POA: Diagnosis not present

## 2023-03-13 DIAGNOSIS — Z87891 Personal history of nicotine dependence: Secondary | ICD-10-CM | POA: Diagnosis not present

## 2023-03-13 DIAGNOSIS — Z5111 Encounter for antineoplastic chemotherapy: Secondary | ICD-10-CM | POA: Diagnosis not present

## 2023-03-13 DIAGNOSIS — I2510 Atherosclerotic heart disease of native coronary artery without angina pectoris: Secondary | ICD-10-CM | POA: Diagnosis not present

## 2023-03-13 DIAGNOSIS — Z5112 Encounter for antineoplastic immunotherapy: Secondary | ICD-10-CM | POA: Diagnosis not present

## 2023-03-13 DIAGNOSIS — K5900 Constipation, unspecified: Secondary | ICD-10-CM | POA: Diagnosis not present

## 2023-03-13 DIAGNOSIS — R92323 Mammographic fibroglandular density, bilateral breasts: Secondary | ICD-10-CM | POA: Diagnosis not present

## 2023-03-13 LAB — CBC WITH DIFFERENTIAL/PLATELET
Abs Immature Granulocytes: 0.02 10*3/uL (ref 0.00–0.07)
Basophils Absolute: 0 10*3/uL (ref 0.0–0.1)
Basophils Relative: 0 %
Eosinophils Absolute: 0.1 10*3/uL (ref 0.0–0.5)
Eosinophils Relative: 3 %
HCT: 38.7 % (ref 36.0–46.0)
Hemoglobin: 12.1 g/dL (ref 12.0–15.0)
Immature Granulocytes: 0 %
Lymphocytes Relative: 21 %
Lymphs Abs: 1 10*3/uL (ref 0.7–4.0)
MCH: 29.9 pg (ref 26.0–34.0)
MCHC: 31.3 g/dL (ref 30.0–36.0)
MCV: 95.6 fL (ref 80.0–100.0)
Monocytes Absolute: 0.5 10*3/uL (ref 0.1–1.0)
Monocytes Relative: 10 %
Neutro Abs: 3.2 10*3/uL (ref 1.7–7.7)
Neutrophils Relative %: 66 %
Platelets: 197 10*3/uL (ref 150–400)
RBC: 4.05 MIL/uL (ref 3.87–5.11)
RDW: 16.1 % — ABNORMAL HIGH (ref 11.5–15.5)
WBC: 4.8 10*3/uL (ref 4.0–10.5)
nRBC: 0 % (ref 0.0–0.2)

## 2023-03-13 LAB — COMPREHENSIVE METABOLIC PANEL
ALT: 24 U/L (ref 0–44)
AST: 37 U/L (ref 15–41)
Albumin: 3.7 g/dL (ref 3.5–5.0)
Alkaline Phosphatase: 75 U/L (ref 38–126)
Anion gap: 8 (ref 5–15)
BUN: 13 mg/dL (ref 8–23)
CO2: 29 mmol/L (ref 22–32)
Calcium: 8.7 mg/dL — ABNORMAL LOW (ref 8.9–10.3)
Chloride: 102 mmol/L (ref 98–111)
Creatinine, Ser: 0.76 mg/dL (ref 0.44–1.00)
GFR, Estimated: >60 mL/min (ref >60)
Glucose, Bld: 102 mg/dL — ABNORMAL HIGH (ref 70–99)
Potassium: 3.5 mmol/L (ref 3.5–5.1)
Sodium: 139 mmol/L (ref 135–145)
Total Bilirubin: 0.3 mg/dL (ref 0.3–1.2)
Total Protein: 6.8 g/dL (ref 6.5–8.1)

## 2023-03-13 LAB — MAGNESIUM: Magnesium: 1.3 mg/dL — ABNORMAL LOW (ref 1.7–2.4)

## 2023-03-13 MED ORDER — SODIUM CHLORIDE 0.9 % IV SOLN
100.0000 mg/m2 | Freq: Once | INTRAVENOUS | Status: AC
  Administered 2023-03-13: 180 mg via INTRAVENOUS
  Filled 2023-03-13: qty 5

## 2023-03-13 MED ORDER — SODIUM CHLORIDE 0.9 % IV SOLN
10.0000 mg | Freq: Once | INTRAVENOUS | Status: AC
  Administered 2023-03-13: 10 mg via INTRAVENOUS
  Filled 2023-03-13: qty 10

## 2023-03-13 MED ORDER — HEPARIN SOD (PORK) LOCK FLUSH 100 UNIT/ML IV SOLN
500.0000 [IU] | Freq: Once | INTRAVENOUS | Status: AC | PRN
  Administered 2023-03-13: 500 [IU]
  Filled 2023-03-13: qty 5

## 2023-03-13 MED ORDER — DIPHENHYDRAMINE HCL 50 MG/ML IJ SOLN
25.0000 mg | Freq: Once | INTRAMUSCULAR | Status: AC
  Administered 2023-03-13: 25 mg via INTRAVENOUS
  Filled 2023-03-13: qty 1

## 2023-03-13 MED ORDER — MAGNESIUM SULFATE 4 GM/100ML IV SOLN
4.0000 g | Freq: Every day | INTRAVENOUS | Status: DC | PRN
  Administered 2023-03-13: 4 g via INTRAVENOUS
  Filled 2023-03-13: qty 100

## 2023-03-13 MED ORDER — SODIUM CHLORIDE 0.9 % IV SOLN
6.0000 mg/kg | Freq: Once | INTRAVENOUS | Status: AC
  Administered 2023-03-13: 440 mg via INTRAVENOUS
  Filled 2023-03-13: qty 2

## 2023-03-13 MED ORDER — PALONOSETRON HCL INJECTION 0.25 MG/5ML
0.2500 mg | Freq: Once | INTRAVENOUS | Status: AC
  Administered 2023-03-13: 0.25 mg via INTRAVENOUS
  Filled 2023-03-13: qty 5

## 2023-03-13 MED ORDER — ATROPINE SULFATE 1 MG/ML IV SOLN
0.5000 mg | Freq: Once | INTRAVENOUS | Status: AC
  Administered 2023-03-13: 0.5 mg via INTRAVENOUS
  Filled 2023-03-13: qty 1

## 2023-03-13 MED ORDER — MAGNESIUM CHLORIDE 64 MG PO TBEC: 1.0000 | DELAYED_RELEASE_TABLET | Freq: Two times a day (BID) | ORAL | 2 refills | Status: DC

## 2023-03-13 MED ORDER — SODIUM CHLORIDE 0.9 % IV SOLN
Freq: Once | INTRAVENOUS | Status: AC
  Filled 2023-03-13: qty 250

## 2023-03-13 NOTE — Patient Instructions (Signed)
[facility]  Discharge Instructions: Thank you for choosing [facility] to provide your oncology and hematology care.  If you have a lab appointment with the Cancer Center, please go directly to the Cancer Center and check in at the registration area.  Wear comfortable clothing and clothing appropriate for easy access to any Portacath or PICC line.   We strive to give you quality time with your provider. You may need to reschedule your appointment if you arrive late (15 or more minutes).  Arriving late affects you and other patients whose appointments are after yours.  Also, if you miss three or more appointments without notifying the office, you may be dismissed from the clinic at the provider's discretion.      For prescription refill requests, have your pharmacy contact our office and allow 72 hours for refills to be completed.    Today you received the following chemotherapy and/or immunotherapy agents- vectibix, irinotecan      To help prevent nausea and vomiting after your treatment, we encourage you to take your nausea medication as directed.  BELOW ARE SYMPTOMS THAT SHOULD BE REPORTED IMMEDIATELY: *FEVER GREATER THAN 100.4 F (38 C) OR HIGHER *CHILLS OR SWEATING *NAUSEA AND VOMITING THAT IS NOT CONTROLLED WITH YOUR NAUSEA MEDICATION *UNUSUAL SHORTNESS OF BREATH *UNUSUAL BRUISING OR BLEEDING *URINARY PROBLEMS (pain or burning when urinating, or frequent urination) *BOWEL PROBLEMS (unusual diarrhea, constipation, pain near the anus) TENDERNESS IN MOUTH AND THROAT WITH OR WITHOUT PRESENCE OF ULCERS (sore throat, sores in mouth, or a toothache) UNUSUAL RASH, SWELLING OR PAIN  UNUSUAL VAGINAL DISCHARGE OR ITCHING   Items with * indicate a potential emergency and should be followed up as soon as possible or go to the Emergency Department if any problems should occur.  Please show the CHEMOTHERAPY ALERT CARD or IMMUNOTHERAPY ALERT CARD at  check-in to the Emergency Department and triage nurse.  Should you have questions after your visit or need to cancel or reschedule your appointment, please contact [facility]  [phone] and follow the prompts.  Office hours are 8:00 a.m. to 4:30 p.m. Monday - Friday. Please note that voicemails left after 4:00 p.m. may not be returned until the following business day.  We are closed weekends and major holidays. You have access to a nurse at all times for urgent questions. Please call the main number to the clinic [phone] and follow the prompts.  For any non-urgent questions, you may also contact your provider using MyChart. We now offer e-Visits for anyone 73 and older to request care online for non-urgent symptoms. For details visit [email or web address].   Also download the MyChart app! Go to the app store, search "MyChart", open the app, select [place], and log in with your MyChart username and password.

## 2023-03-13 NOTE — Progress Notes (Signed)
Hematology/Oncology Progress note [phone] [phone]      CHIEF COMPLAINTS/REASON FOR VISIT:  Follow up for rectal cancer  ASSESSMENT & PLAN:   Cancer Staging  Rectal cancer [facility]) Staging form: Colon and Rectum, AJCC 8th Edition - Pathologic stage from 10/06/2019: Stage IIC (ypT4b, pN0, cM0) - Signed by [name], MD on 10/06/2019 - Clinical stage from 06/30/2020: Jacqueline Yoder - Signed by [name], MD on 04/17/2022 - Pathologic: Stage Unknown (rpTX, pNX, cM1) - Signed by [name], MD on 01/31/2021   Rectal cancer [facility]) History of stage IIIC Rectal cancer, s/p TNT, followed by 09/17/19 APR/posterior vaginectomy/TAH/BSO/VY-flap, pT4b pN0 with close vaginal margin 0.2 mm.  Uterus and ovaries negative for malignancy. palliative radiation to vaginal recurrence- 01/19/21 recurrence with lung metastasis.-Palliative -FOLFIRI plus bevacizumab.  Irinotecan was dropped in November 2022 due to side effects. Negative for UGT1A1*28 - radiographically stable, rise of CEA-July 2023 CT lung metastasis worse--> Dec 2023 PET showed progression in pelvic lymph nodes and bone lesions,--> 2nd line irinotecan +panitumumab-->CT March 2024 stable.  Labs are reviewed and discussed with patient, CEA nadir was in 500s, currently gradually increasing.  Proceed with irinotecan +panitumumab Obtain CT chest abdomen pelvis in June 2024   Encounter for antineoplastic chemotherapy Chemotherapy plan as listed above.   Anemia due to antineoplastic chemotherapy Hb stable observation  Chemotherapy induced neutropenia (HCC) patient will receive long-acting GCSF prophylaxis on D3  Vaginal spotting likely recurrent rectal cancer involving vagina. Discussed with GynOnc She is asymptomatic, recommend observation.  If she develops recurrent spotting or other symptoms, recommend palliative radiation.   History of pulmonary embolism Continue  Eliquis 2.5 mg twice daily for anticoagulation  prophylaxis.  Chemotherapy induced diarrhea Stable, continue lomotil  QID PRN as instructed.   Hypokalemia  potassium is stable.  Continue potassium chloride 20 mEq  daily.   Hypomagnesemia If magnesium is 1.0 - 1.2:  administrate 6 gm IV magnesium sulfate If magnesium is 1.2-1.4, administrate 4 gm IV magnesium sulfate If magnesium is 1.5-1.6:  administrate 2 gm IV magnesium sulfate.  Continue slow mag orally 64mg  twice daily.    Follow up 2 weeks Lab MD irinotecan + panitumumab All questions were answered. The patient knows to call the clinic with any problems, questions or concerns.  [name], MD, PhD [facility] Hematology Oncology 03/13/2023      HISTORY OF PRESENTING ILLNESS:  Patient initially presented with complaints of postmenopausal bleeding on 08/16/2018.  History of was menopausal vaginal bleeding in 2016 which resulted in cervical polypectomy.  Pathology 02/04/2015 showed cervical polyp, consistent with benign endometrial polyp.  [name] after polypectomy due to anxiety associated with pelvic exams.  pelvic exam on 08/16/2018 reviewed cervical abnormality and from enlarged uterus. Seen by Dr. [name] on 10/29/2018.  Endometrial biopsy and a Pap smear was performed. 10/29/2018 Pap smear showed adenocarcinoma, favor endometrial origin. 10/29/2018 endometrial biopsy showed endometrioid carcinoma, FIGO grade 1.  10/29/2018- TA & TV Ultrasound revealed: Anteverted uterus measuring 8.7 x 5.6 x 6.4 cm without evidence of focal masses.  The endometrium measuring 24.1 mm (thickened) and heterogeneous.  Right and left ovaries not visualized.  No adnexal masses identified.  No free fluid in cul-de-sac.  Patient was seen by Dr. [name] in clinic on 11/13/2018.  Cervical exam reveals 2 cm exophytic irregular mass consistent with malignancy.   11/19/2018 CT chest abdomen pelvis with contrast showed thickened endometrium with some irregularity compatible with the provided diagnosis  of endometrial malignancy.  There  is a mildly prominent left inguinal node 1.4 cm.  Patient was seen by Dr. [name] on 11/20/2018 and left groin lymph node biopsy was recommended.  11/26/2018 patient underwent left inguinal lymph node biopsy. Pathology showed metastatic adenocarcinoma consistent with colorectal origin.  CDX 2+.  Case was discussed on tumor board.  Recommend colonoscopy for further evaluation.  Patient reports significant weight loss 30 pounds over the last year.  Chronic vaginal spotting. Change of bowel habits the past few months.  More constipated.  Family history positive for brother who has colon cancer prostate cancer.  patient has underwent colonoscopy on 12/03/2018 which reviewed a nonobstructing large mass in the rectum.  Also chronic fistula.  Mass was not circumferential.  This was biopsied with a cold forceps for histology.  Pathology came back hyperplastic polyp negative for dysplasia and malignancy. [name], patient underwent flex sigmoidoscopy on 12/06/2018 with rebiopsy of the rectal mass. This time biopsy results came back positive for invasive colorectal adenocarcinoma, moderately differentiated. Immunotherapy for nearly mismatch repair protein (MMR ) was performed.  There is no loss of MMR expression.  low probability of MSI high.   # Seen by [name] for evaluation of resectability for rectal cancer. In addition, she also had a second opinion with [facility] pathology where her endometrial biopsy pathology was changed to  adenocarcinoma, consistent with colorectal primary.   [name]. She has home health that has been assisting with ostomy care  Patient was also evaluated by [facility] oncology.  Recommendation is to proceed with TNT with concurrent chemoradiation followed by neoadjuvant chemotherapy followed by surgical resection. Patient prefers to have treatment done locally with [facility].   # Oncology  Treatment:  02/03/2019- 03/19/2019  concurrent Xeloda and radiation.  Xeloda dose 825mg  /m2 BID - rounded to 1650mg  BID- on days of radiation. 04/09/2019, started on FOLFOX with bolus early.  Omitted.  07/16/2019 finished 8 cycles of FOLFOX. 09/17/19 APR/posterior vaginectomy/TAH/BSO/VY-flap pT4b pN0 with close vaginal margin 0.2 mm.  Uterus and ovaries negative for malignancy. Patient reports bilateral lower extremity numbness and tingling, intermittent, left worse than right. She has lost a lot of weight since her APR surgery.   #Family history with half brother having's history of colon cancer prostate cancer.  Personal history of colorectal cancer.  Patient has not decided if she wants genetic testing.    # history of PE( 01/13/2020)  in the bilateral lower extremity DVT (01/13/2020).   She finishes 6 months of anticoagulation with Eliquis 5 mg twice daily. Now switched to Eliquis 2.5 mg twice daily..  # She has now developed recurrent disease. #06/30/20  vaginal introitus mass biopsied. Pathology is consistent with metastatic colorectal adenocarcinoma I have discussed with [name]  Dr. Luciano Cutter and the mass is not resectable. Patient has also had colonoscopy by Dr. Tobi Bastos yesterday. Normal examination. # 07/16/2020 cycle 1 FOLFIRI  # 07/20/2020 PET scan was done for further evaluation, images are consistent with local recurrence, no distant metastasis. #Discussed with radiation oncology Dr. Rushie Chestnut will recommends concurrent chemotherapy and radiation. 08/02/2020-08/16/2020, patient starts radiation.  Xeloda was held due to neutropenia 08/17/2020,-09/06/2020 Xeloda 1500 mg twice daily concurrently with radiation  01/31/21 started on FOLFIRI + Bev 05/18/2021 CT chest abdomen pelvis showed Previously noted enlargement of bilateral inguinal lymph nodes is resolved, consistent with treatment response of nodal metastatic disease. Interval decrease in size of multiple small bilateral pulmonary nodules,  consistent with treatment response of pulmonary metastatic disease.  No evidence of new metastatic disease. 05/24/2021 - 08/30/2021, continued on FOLFIRI plus bevacizumab.  Irinotecan dose was reduced, eventually 100mg /m2  09/02/2021, CT chest abdomen pelvis without contrast Showed small bilateral pulmonary nodules, unchanged.  Stable metastatic disease.  No noncontrast evidence of new metastatic disease in the chest abdomen pelvis.  Small parastomal hernia.  Enlargement of main pulmonary artery.  Coronary artery disease.  09/13/2021, maintenance 5-FU/bevacizumab 11/28/2021, 5-FU/Irinotecan/bevacizumab.  Irinotecan 100 mg/m2 was added back due to progressively increasing CEA.    INTERVAL HISTORY Jacqueline Yoder is a 77 y.o. female who has above history reviewed by me presents for follow-up of rectal cancer. On Irinotecan and panitumumab. she tolerates well.  She was accompanied by her daughter. - chronic diarrhea is manageable.  -she denies any additional episode of vaginal spotting since last visit.  Review of Systems  Constitutional:  Positive for fatigue. Negative for appetite change, chills, fever and unexpected weight change.  HENT:   Negative for hearing loss and voice change.   Eyes:  Negative for eye problems.  Respiratory:  Negative for chest tightness and cough.   Cardiovascular:  Negative for chest pain.  Gastrointestinal:  Negative for abdominal distention, abdominal pain, blood in stool, constipation, diarrhea and nausea.  Endocrine: Negative for hot flashes.  Genitourinary:  Negative for difficulty urinating and frequency.   Musculoskeletal:  Positive for arthralgias.  Skin:  Positive for rash. Negative for itching.  Neurological:  Negative for extremity weakness and numbness.  Hematological:  Negative for adenopathy.  Psychiatric/Behavioral:  Negative for confusion.     MEDICAL HISTORY:  Past Medical History:  Diagnosis Date   Allergy    Arthritis    Blood clot in vein     Family history of colon cancer    GERD (gastroesophageal reflux disease)    Hypercholesteremia    Hypertension    Hypertension    Lower extremity edema    Personal history of chemotherapy    Rectal cancer (HCC) 12/2018   Urinary incontinence     SURGICAL HISTORY: Past Surgical History:  Procedure Laterality Date   ABDOMINAL HYSTERECTOMY     CHOLECYSTECTOMY  1971   COLONOSCOPY WITH PROPOFOL N/A 12/03/2018   Procedure: COLONOSCOPY WITH PROPOFOL;  Surgeon: Midge Minium, MD;  Location: [facility] ENDOSCOPY;  Service: Endoscopy;  Laterality: N/A;   COLONOSCOPY WITH PROPOFOL N/A 07/15/2020   Procedure: COLONOSCOPY WITH PROPOFOL;  Surgeon: Wyline Mood, MD;  Location: [facility] ENDOSCOPY;  Service: Gastroenterology;  Laterality: N/A;   FLEXIBLE SIGMOIDOSCOPY N/A 12/06/2018   Procedure: FLEXIBLE SIGMOIDOSCOPY;  Surgeon: Wyline Mood, MD;  Location: [facility] ENDOSCOPY;  Service: Endoscopy;  Laterality: N/A;   LAPAROSCOPIC COLOSTOMY  01/06/2019   PORTACATH PLACEMENT N/A 04/03/2019   Procedure: INSERTION PORT-A-CATH;  Surgeon: Leafy Ro, MD;  Location: [facility] ORS;  Service: General;  Laterality: N/A;    SOCIAL HISTORY: Social History   Socioeconomic History   Marital status: Widowed    Spouse name: Not on file   Number of children: Not on file   Years of education: Not on file   Highest education level: Not on file  Occupational History   Not on file  Tobacco Use   Smoking status: Former    Types: Cigarettes    Quit date: 12/02/1977    Years since quitting: 45.3   Smokeless tobacco: Former  Building services engineer Use: Never used  Substance and Sexual Activity   Alcohol use: Never   Drug use: Never   Sexual activity: Not Currently  Birth control/protection: None  Other Topics Concern   Not on file  Social History Narrative   Lives with daughter   Social Determinants of Health   Financial Resource Strain: Medium Risk (06/30/2022)   Overall Financial Resource Strain (CARDIA)     Difficulty of Paying Living Expenses: Somewhat hard  Food Insecurity: No Food Insecurity (06/30/2022)   Hunger Vital Sign    Worried About Running Out of Food in the Last Year: Never true    Ran Out of Food in the Last Year: Never true  Transportation Needs: No Transportation Needs (06/30/2022)   PRAPARE - Administrator, Civil Service (Medical): No    Lack of Transportation (Non-Medical): No  Physical Activity: Inactive (06/30/2022)   Exercise Vital Sign    Days of Exercise per Week: 0 days    Minutes of Exercise per Session: 0 min  Stress: No Stress Concern Present (06/30/2022)   [employer] of Occupational Health - Occupational Stress Questionnaire    Feeling of Stress : Not at all  Social Connections: Moderately Isolated (06/30/2022)   Social Connection and Isolation Panel [NHANES]    Frequency of Communication with Friends and Family: More than three times a week    Frequency of Social Gatherings with Friends and Family: Twice a week    Attends Religious Services: More than 4 times per year    Active Member of [employer] or Organizations: No    Attends Banker Meetings: Never    Marital Status: Widowed  Intimate Partner Violence: Not At Risk (06/30/2022)   Humiliation, Afraid, Rape, and Kick questionnaire    Fear of Current or Ex-Partner: No    Emotionally Abused: No    Physically Abused: No    Sexually Abused: No    FAMILY HISTORY: Family History  Problem Relation Age of Onset   Colon cancer Brother 40       exposure to chemicals [place]   Hypertension Mother    Stroke Mother    Kidney failure Father    Breast cancer Neg Hx    Ovarian cancer Neg Hx     ALLERGIES:  is allergic to sulfamethoxazole-trimethoprim.  MEDICATIONS:  Current Outpatient Medications  Medication Sig Dispense Refill   Cholecalciferol (VITAMIN D3) 2000 units capsule Take 2,000 Units by mouth daily.     cholestyramine (QUESTRAN) 4 g packet Take 1 packet (4 g total) by mouth  3 (three) times daily. 90 each 1   clindamycin (CLINDAGEL) 1 % gel Apply topically 2 (two) times daily. 30 g 5   diclofenac sodium (VOLTAREN) 1 % GEL Apply 2 g topically 4 (four) times daily as needed (joint pain).  11   diphenoxylate-atropine (LOMOTIL) 2.5-0.025 MG tablet TAKE 1 TABLET BY MOUTH 4 TIMES DAILY AS NEEDED FOR DIARRHEA OR  LOOSE  STOOLS 60 tablet 0   ELIQUIS 2.5 MG TABS tablet TAKE 1 TABLET BY MOUTH TWICE  DAILY 200 tablet 2   fluticasone (FLONASE) 50 MCG/ACT nasal spray USE 1 SPRAY IN EACH NOSTRIL ONCE DAILY 16 g 3   ipratropium (ATROVENT) 0.03 % nasal spray Place 1 spray into both nostrils 2 (two) times daily.     ketoconazole (NIZORAL) 2 % cream Apply 1 Application topically daily. For up to 2 weeks. May repeat if need. 30 g 2   lidocaine-prilocaine (EMLA) cream Apply 1 application. topically as needed. 30 g 6   loperamide (IMODIUM) 2 MG capsule Take 1 capsule (2 mg total) by mouth See admin instructions. With  onset of loose stool, take 4mg  followed by 2mg  every 2 hours,  Maximum: 16 mg/day 120 capsule 1   loratadine (CLARITIN) 10 MG tablet Take 10 mg by mouth daily.     magnesium chloride (SLOW-MAG) 64 MG TBEC SR tablet Take 1 tablet (64 mg total) by mouth 2 (two) times daily. 30 tablet 1   Multiple Vitamins-Minerals (ONE-A-DAY WOMENS 50 PLUS PO) Take 1 tablet by mouth daily.      nystatin (MYCOSTATIN/NYSTOP) powder Apply 1 Application topically 3 (three) times daily. 45 g 0   potassium chloride SA (KLOR-CON M) 20 MEQ tablet Take 1 tablet (20 mEq total) by mouth daily. 100 tablet 1   simvastatin (ZOCOR) 40 MG tablet Take 1 tablet (40 mg total) by mouth at bedtime. 100 tablet 3   triamcinolone cream (KENALOG) 0.5 % Apply 1 Application topically 2 (two) times daily. To affected areas, for up to 2 weeks. 30 g 2   triamterene-hydrochlorothiazide (DYAZIDE) 37.5-25 MG capsule Take 1 each (1 capsule total) by mouth daily. 100 capsule 3   zinc gluconate 50 MG tablet Take 50 mg by mouth  daily.     No current facility-administered medications for this visit.   Facility-Administered Medications Ordered in Other Visits  Medication Dose Route Frequency Provider Last Rate Last Admin   magnesium sulfate IVPB 4 g 100 mL  4 g Intravenous Daily PRN [name], MD   Stopped at 03/13/23 1417     PHYSICAL EXAMINATION: ECOG PERFORMANCE STATUS: 1 - Symptomatic but completely ambulatory  Physical Exam Constitutional:      General: She is not in acute distress. HENT:     Head: Normocephalic and atraumatic.  Eyes:     General: No scleral icterus. Cardiovascular:     Rate and Rhythm: Normal rate.     Heart sounds: Normal heart sounds.  Pulmonary:     Effort: Pulmonary effort is normal. No respiratory distress.     Breath sounds: No wheezing.  Abdominal:     General: Bowel sounds are normal. There is no distension.     Palpations: Abdomen is soft.     Comments: + Colostomy bag   Musculoskeletal:        General: No deformity. Normal range of motion.     Cervical back: Normal range of motion and neck supple.  Skin:    General: Skin is warm and dry.  Neurological:     Mental Status: She is alert and oriented to person, place, and time. Mental status is at baseline.     Cranial Nerves: No cranial nerve deficit.     Coordination: Coordination normal.       LABORATORY DATA:  I have reviewed the data as listed    Latest Ref Rng & Units 03/13/2023    9:46 AM 02/27/2023    8:38 AM 02/13/2023    8:32 AM  CBC  WBC 4.0 - 10.5 K/uL 4.8  5.9  5.3   Hemoglobin 12.0 - 15.0 g/dL 16.1  09.6  04.5   Hematocrit 36.0 - 46.0 % 38.7  37.3  34.9   Platelets 150 - 400 K/uL 197  215  176       Latest Ref Rng & Units 03/13/2023    9:46 AM 02/27/2023    8:38 AM 02/13/2023    8:32 AM  CMP  Glucose 70 - 99 mg/dL 409  94  97   BUN 8 - 23 mg/dL 13  18  14    Creatinine 0.44 -  1.00 mg/dL 1.61  0.96  0.45   Sodium 135 - 145 mmol/L 139  140  137   Potassium 3.5 - 5.1 mmol/L 3.5  3.8  3.6    Chloride 98 - 111 mmol/L 102  103  104   CO2 22 - 32 mmol/L 29  28  25    Calcium 8.9 - 10.3 mg/dL 8.7  8.7  8.6   Total Protein 6.5 - 8.1 g/dL 6.8  6.6  6.3   Total Bilirubin 0.3 - 1.2 mg/dL 0.3  0.4  0.4   Alkaline Phos 38 - 126 U/L 75  79  68   AST 15 - 41 U/L 37  37  31   ALT 0 - 44 U/L 24  24  19       RADIOGRAPHIC STUDIES: I have personally reviewed the radiological images as listed and agreed with the findings in the report. MM 3D SCREEN BREAST BILATERAL  Result Date: 01/22/2023 CLINICAL DATA:  Screening. EXAM: DIGITAL SCREENING BILATERAL MAMMOGRAM WITH TOMOSYNTHESIS AND CAD TECHNIQUE: Bilateral screening digital craniocaudal and mediolateral oblique mammograms were obtained. Bilateral screening digital breast tomosynthesis was performed. The images were evaluated with computer-aided detection. COMPARISON:  Previous exam(s). ACR Breast Density Category b: There are scattered areas of fibroglandular density. FINDINGS: There are no findings suspicious for malignancy. IMPRESSION: No mammographic evidence of malignancy. A result letter of this screening mammogram will be mailed directly to the patient. RECOMMENDATION: Screening mammogram in one year. (Code:SM-B-01Y) BI-RADS CATEGORY  1: Negative. Electronically Signed   By: Bary Richard M.D.   On: 01/22/2023 11:01   CT CHEST ABDOMEN PELVIS W CONTRAST  Result Date: 12/26/2022 CLINICAL DATA:  Metastatic rectal adenocarcinoma; * Tracking Code: BO * EXAM: CT CHEST, ABDOMEN, AND PELVIS WITH CONTRAST TECHNIQUE: Multidetector CT imaging of the chest, abdomen and pelvis was performed following the standard protocol during bolus administration of intravenous contrast. RADIATION DOSE REDUCTION: This exam was performed according to the departmental dose-optimization program which includes automated exposure control, adjustment of the mA and/or kV according to patient size and/or use of iterative reconstruction technique. CONTRAST:  OMNIPAQUE IOHEXOL  300 MG/ML  SOLN COMPARISON:  PET-CT dated September 19, 2022 FINDINGS: CT CHEST FINDINGS Cardiovascular: Normal heart size. No pericardial effusion. Normal caliber thoracic aorta mild atherosclerotic disease. Right chest wall port with tip in the right atrium. Mild coronary artery calcifications. Mediastinum/Nodes: Esophagus and thyroid are unremarkable. No pathologically enlarged lymph nodes seen in the chest. Lungs/Pleura: Central airways are patent. Scattered small solid pulmonary nodules stable. Reference irregular solid pulmonary nodule of the right middle lobe measuring 10 x 7 mm on series 4, image 102, unchanged when compared with the prior exam and remeasured in similar plane. Musculoskeletal: Osseous lesion of the proximal right humerus demonstrates increased sclerosis when compared with the prior PET-CT possibly due to treatment response. CT ABDOMEN PELVIS FINDINGS Hepatobiliary: No suspicious liver lesion. Mild intra and extrahepatic biliary ductal dilation, unchanged when compared with the prior exams. Pancreas: Unremarkable. No pancreatic ductal dilatation or surrounding inflammatory changes. Spleen: Normal in size without focal abnormality. Adrenals/Urinary Tract: Bilateral adrenal glands are unremarkable. No hydronephrosis or nephrolithiasis. Bilateral simple appearing renal cysts, no specific follow-up imaging is recommended for these lesions. No suspicious renal lesions. Stomach/Bowel: Postsurgical changes of prior APR with left lower quadrant colostomy. Normal appearance of the stomach. Normal appendix. No evidence of obstruction. Vascular/Lymphatic: Aortic atherosclerosis. Stable size of right inguinal and right pelvic lymph nodes. Reference right inguinal lymph node measuring 11 mm  on series 2, image 105. Reference right inguinal lymph node measuring 6 mm on series 2, image 98, not significantly changed in size. Reproductive: No adnexal mass. Other: No abdominopelvic ascites. Musculoskeletal:  Osseous irregularity of the left iliac bone, unchanged when compared with prior exam. IMPRESSION: 1. Stable scattered solid pulmonary nodules. 2. Right inguinal and right pelvic lymph nodes which were hypermetabolic on prior PET-CT are stable. 3. Osseous lesion of the proximal right humerus demonstrates increased sclerosis when compared with the prior PET-CT, possibly due to treatment response. 4. 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 5. Aortic Atherosclerosis (ICD10-I70.0). Electronically Signed   By: Allegra Lai M.D.   On: 12/26/2022 09:05      MM 3D SCREEN BREAST BILATERAL  Result Date: 01/22/2023 CLINICAL DATA:  Screening. EXAM: DIGITAL SCREENING BILATERAL MAMMOGRAM WITH TOMOSYNTHESIS AND CAD TECHNIQUE: Bilateral screening digital craniocaudal and mediolateral oblique mammograms were obtained. Bilateral screening digital breast tomosynthesis was performed. The images were evaluated with computer-aided detection. COMPARISON:  Previous exam(s). ACR Breast Density Category b: There are scattered areas of fibroglandular density. FINDINGS: There are no findings suspicious for malignancy. IMPRESSION: No mammographic evidence of malignancy. A result letter of this screening mammogram will be mailed directly to the patient. RECOMMENDATION: Screening mammogram in one year. (Code:SM-B-01Y) BI-RADS CATEGORY  1: Negative. Electronically Signed   By: Bary Richard M.D.   On: 01/22/2023 11:01   CT CHEST ABDOMEN PELVIS W CONTRAST  Result Date: 12/26/2022 CLINICAL DATA:  Metastatic rectal adenocarcinoma; * Tracking Code: BO * EXAM: CT CHEST, ABDOMEN, AND PELVIS WITH CONTRAST TECHNIQUE: Multidetector CT imaging of the chest, abdomen and pelvis was performed following the standard protocol during bolus administration of intravenous contrast. RADIATION DOSE REDUCTION: This exam was performed according to the departmental dose-optimization program which includes automated exposure control, adjustment of  the mA and/or kV according to patient size and/or use of iterative reconstruction technique. CONTRAST:  OMNIPAQUE IOHEXOL 300 MG/ML  SOLN COMPARISON:  PET-CT dated September 19, 2022 FINDINGS: CT CHEST FINDINGS Cardiovascular: Normal heart size. No pericardial effusion. Normal caliber thoracic aorta mild atherosclerotic disease. Right chest wall port with tip in the right atrium. Mild coronary artery calcifications. Mediastinum/Nodes: Esophagus and thyroid are unremarkable. No pathologically enlarged lymph nodes seen in the chest. Lungs/Pleura: Central airways are patent. Scattered small solid pulmonary nodules stable. Reference irregular solid pulmonary nodule of the right middle lobe measuring 10 x 7 mm on series 4, image 102, unchanged when compared with the prior exam and remeasured in similar plane. Musculoskeletal: Osseous lesion of the proximal right humerus demonstrates increased sclerosis when compared with the prior PET-CT possibly due to treatment response. CT ABDOMEN PELVIS FINDINGS Hepatobiliary: No suspicious liver lesion. Mild intra and extrahepatic biliary ductal dilation, unchanged when compared with the prior exams. Pancreas: Unremarkable. No pancreatic ductal dilatation or surrounding inflammatory changes. Spleen: Normal in size without focal abnormality. Adrenals/Urinary Tract: Bilateral adrenal glands are unremarkable. No hydronephrosis or nephrolithiasis. Bilateral simple appearing renal cysts, no specific follow-up imaging is recommended for these lesions. No suspicious renal lesions. Stomach/Bowel: Postsurgical changes of prior APR with left lower quadrant colostomy. Normal appearance of the stomach. Normal appendix. No evidence of obstruction. Vascular/Lymphatic: Aortic atherosclerosis. Stable size of right inguinal and right pelvic lymph nodes. Reference right inguinal lymph node measuring 11 mm on series 2, image 105. Reference right inguinal lymph node measuring 6 mm on series 2,  image 98, not significantly changed in size. Reproductive: No adnexal mass. Other: No abdominopelvic ascites.  Musculoskeletal: Osseous irregularity of the left iliac bone, unchanged when compared with prior exam. IMPRESSION: 1. Stable scattered solid pulmonary nodules. 2. Right inguinal and right pelvic lymph nodes which were hypermetabolic on prior PET-CT are stable. 3. Osseous lesion of the proximal right humerus demonstrates increased sclerosis when compared with the prior PET-CT, possibly due to treatment response. 4. 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 5. Aortic Atherosclerosis (ICD10-I70.0). Electronically Signed   By: Allegra Lai M.D.   On: 12/26/2022 09:05

## 2023-03-13 NOTE — Assessment & Plan Note (Signed)
patient will receive long-acting GCSF prophylaxis on D3 

## 2023-03-13 NOTE — Assessment & Plan Note (Signed)
Stable, continue lomotil  QID PRN as instructed.  

## 2023-03-13 NOTE — Assessment & Plan Note (Signed)
Chemotherapy plan as listed above 

## 2023-03-13 NOTE — Assessment & Plan Note (Signed)
Hb stable observation 

## 2023-03-13 NOTE — Assessment & Plan Note (Addendum)
History of stage IIIC Rectal cancer, s/p TNT, followed by 09/17/19 APR/posterior vaginectomy/TAH/BSO/VY-flap, pT4b pN0 with close vaginal margin 0.2 mm.  Uterus and ovaries negative for malignancy. palliative radiation to vaginal recurrence- 01/19/21 recurrence with lung metastasis.-Palliative -FOLFIRI plus bevacizumab.  Irinotecan was dropped in November 2022 due to side effects. Negative for UGT1A1*28 - radiographically stable, rise of CEA-July 2023 CT lung metastasis worse--> Dec 2023 PET showed progression in pelvic lymph nodes and bone lesions,--> 2nd line irinotecan +panitumumab-->CT March 2024 stable.  Labs are reviewed and discussed with patient, CEA nadir was in 500s, currently gradually increasing.  Proceed with irinotecan +panitumumab Obtain CT chest abdomen pelvis in June 2024

## 2023-03-13 NOTE — Assessment & Plan Note (Signed)
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 Discussed with GynOnc She is asymptomatic, recommend observation.  If she develops recurrent spotting or other symptoms, recommend palliative radiation.

## 2023-03-13 NOTE — Assessment & Plan Note (Signed)
Continue  Eliquis 2.5 mg twice daily for anticoagulation prophylaxis. 

## 2023-03-13 NOTE — Assessment & Plan Note (Signed)
If magnesium is 1.0 - 1.2:  administrate 6 gm IV magnesium sulfate If magnesium is 1.2-1.4, administrate 4 gm IV magnesium sulfate If magnesium is 1.5-1.6:  administrate 2 gm IV magnesium sulfate.  Continue slow mag orally 64mg  twice daily.

## 2023-03-13 NOTE — Assessment & Plan Note (Signed)
potassium is stable.  Continue potassium chloride 20 mEq  daily.  

## 2023-03-14 ENCOUNTER — Telehealth: Payer: Self-pay | Admitting: *Deleted

## 2023-03-14 LAB — CEA: CEA: 791 ng/mL — ABNORMAL HIGH (ref 0.0–4.7)

## 2023-03-14 NOTE — Telephone Encounter (Signed)
Call returned to [facility] and she wanted to be sure that Dr C is in the loop with what patient is going through and no patient is not having symptoms right now but she would like Dr Renaye Rakers input once the CT is done so I asked that she call me back the day after her CT and I will discuss with Dr Renaye Rakers nurse Selena Batten. She thanked me for calling her back and speaking to her

## 2023-03-14 NOTE — Telephone Encounter (Signed)
Daughter Debbe Odea called asking if patient would benefit from XRt or not. She said tthat patient is scheduled for CT  6/4. Please return her call to discuss with her

## 2023-03-15 ENCOUNTER — Inpatient Hospital Stay: Payer: 59

## 2023-03-15 DIAGNOSIS — R92323 Mammographic fibroglandular density, bilateral breasts: Secondary | ICD-10-CM | POA: Diagnosis not present

## 2023-03-15 DIAGNOSIS — K219 Gastro-esophageal reflux disease without esophagitis: Secondary | ICD-10-CM | POA: Diagnosis not present

## 2023-03-15 DIAGNOSIS — K5900 Constipation, unspecified: Secondary | ICD-10-CM | POA: Diagnosis not present

## 2023-03-15 DIAGNOSIS — I129 Hypertensive chronic kidney disease with stage 1 through stage 4 chronic kidney disease, or unspecified chronic kidney disease: Secondary | ICD-10-CM | POA: Diagnosis not present

## 2023-03-15 DIAGNOSIS — R634 Abnormal weight loss: Secondary | ICD-10-CM | POA: Diagnosis not present

## 2023-03-15 DIAGNOSIS — Z7901 Long term (current) use of anticoagulants: Secondary | ICD-10-CM | POA: Diagnosis not present

## 2023-03-15 DIAGNOSIS — C20 Malignant neoplasm of rectum: Secondary | ICD-10-CM | POA: Diagnosis not present

## 2023-03-15 DIAGNOSIS — Z79899 Other long term (current) drug therapy: Secondary | ICD-10-CM | POA: Diagnosis not present

## 2023-03-15 DIAGNOSIS — Z800 Family history of malignant neoplasm of digestive organs: Secondary | ICD-10-CM | POA: Diagnosis not present

## 2023-03-15 DIAGNOSIS — E7800 Pure hypercholesterolemia, unspecified: Secondary | ICD-10-CM | POA: Diagnosis not present

## 2023-03-15 DIAGNOSIS — T451X5A Adverse effect of antineoplastic and immunosuppressive drugs, initial encounter: Secondary | ICD-10-CM | POA: Diagnosis not present

## 2023-03-15 DIAGNOSIS — Z86711 Personal history of pulmonary embolism: Secondary | ICD-10-CM | POA: Diagnosis not present

## 2023-03-15 DIAGNOSIS — Z87891 Personal history of nicotine dependence: Secondary | ICD-10-CM | POA: Diagnosis not present

## 2023-03-15 DIAGNOSIS — K435 Parastomal hernia without obstruction or  gangrene: Secondary | ICD-10-CM | POA: Diagnosis not present

## 2023-03-15 DIAGNOSIS — D6481 Anemia due to antineoplastic chemotherapy: Secondary | ICD-10-CM | POA: Diagnosis not present

## 2023-03-15 DIAGNOSIS — I700 Atherosclerosis of aorta: Secondary | ICD-10-CM | POA: Diagnosis not present

## 2023-03-15 DIAGNOSIS — C7800 Secondary malignant neoplasm of unspecified lung: Secondary | ICD-10-CM | POA: Diagnosis not present

## 2023-03-15 DIAGNOSIS — I2510 Atherosclerotic heart disease of native coronary artery without angina pectoris: Secondary | ICD-10-CM | POA: Diagnosis not present

## 2023-03-15 DIAGNOSIS — Z5112 Encounter for antineoplastic immunotherapy: Secondary | ICD-10-CM | POA: Diagnosis not present

## 2023-03-15 DIAGNOSIS — D701 Agranulocytosis secondary to cancer chemotherapy: Secondary | ICD-10-CM | POA: Diagnosis not present

## 2023-03-15 DIAGNOSIS — R197 Diarrhea, unspecified: Secondary | ICD-10-CM | POA: Diagnosis not present

## 2023-03-15 DIAGNOSIS — C799 Secondary malignant neoplasm of unspecified site: Secondary | ICD-10-CM

## 2023-03-15 DIAGNOSIS — E876 Hypokalemia: Secondary | ICD-10-CM | POA: Diagnosis not present

## 2023-03-15 MED ORDER — PEGFILGRASTIM INJECTION 6 MG/0.6ML ~~LOC~~
6.0000 mg | PREFILLED_SYRINGE | Freq: Once | SUBCUTANEOUS | Status: AC
  Administered 2023-03-15: 6 mg via SUBCUTANEOUS
  Filled 2023-03-15: qty 0.6

## 2023-03-15 NOTE — Telephone Encounter (Signed)
Patient does not have a follow up scheduled with Dr. Rushie Chestnut please advise if we need to get her scheduled.

## 2023-03-19 ENCOUNTER — Encounter: Payer: Self-pay | Admitting: Oncology

## 2023-03-20 ENCOUNTER — Ambulatory Visit
Admission: RE | Admit: 2023-03-20 | Discharge: 2023-03-20 | Disposition: A | Discharge status: Home / Self Care | Payer: 59 | Source: Ambulatory Visit | Attending: Oncology | Admitting: Oncology

## 2023-03-20 DIAGNOSIS — C785 Secondary malignant neoplasm of large intestine and rectum: Secondary | ICD-10-CM | POA: Diagnosis not present

## 2023-03-20 DIAGNOSIS — N281 Cyst of kidney, acquired: Secondary | ICD-10-CM | POA: Diagnosis not present

## 2023-03-20 DIAGNOSIS — R918 Other nonspecific abnormal finding of lung field: Secondary | ICD-10-CM | POA: Diagnosis not present

## 2023-03-20 DIAGNOSIS — C775 Secondary and unspecified malignant neoplasm of intrapelvic lymph nodes: Secondary | ICD-10-CM | POA: Diagnosis not present

## 2023-03-20 DIAGNOSIS — C20 Malignant neoplasm of rectum: Secondary | ICD-10-CM | POA: Diagnosis not present

## 2023-03-20 DIAGNOSIS — C7800 Secondary malignant neoplasm of unspecified lung: Secondary | ICD-10-CM | POA: Diagnosis not present

## 2023-03-20 MED ORDER — IOHEXOL 9 MG/ML PO SOLN
500.0000 mL | ORAL | Status: AC
  Administered 2023-03-20 (×2): 500 mL via ORAL

## 2023-03-20 MED ORDER — IOHEXOL 300 MG/ML  SOLN
100.0000 mL | Freq: Once | INTRAMUSCULAR | Status: AC | PRN
  Administered 2023-03-20 (×2): 100 mL via INTRAVENOUS

## 2023-03-24 ENCOUNTER — Other Ambulatory Visit: Payer: Self-pay | Admitting: Oncology

## 2023-03-26 ENCOUNTER — Encounter: Payer: Self-pay | Admitting: Oncology

## 2023-03-26 MED FILL — Dexamethasone Sodium Phosphate Inj 100 MG/10ML: INTRAMUSCULAR | Qty: 1 | Status: AC

## 2023-03-26 NOTE — Telephone Encounter (Signed)
  Component Ref Range & Units 13 d ago (03/13/23) 3 wk ago (02/27/23) 1 mo ago (02/13/23) 1 mo ago (01/30/23) 2 mo ago (01/16/23) 2 mo ago (01/02/23) 3 mo ago (12/19/22)  Potassium 3.5 - 5.1 mmol/L 3.5 3.8 3.6 3.5 3.6 3.7 3.7

## 2023-03-27 ENCOUNTER — Inpatient Hospital Stay: Payer: 59

## 2023-03-27 ENCOUNTER — Inpatient Hospital Stay: Payer: 59 | Attending: Oncology | Admitting: Oncology

## 2023-03-27 ENCOUNTER — Encounter: Payer: Self-pay | Admitting: Oncology

## 2023-03-27 VITALS — BP 122/67 | HR 70 | Temp 97.5°F | Resp 18 | Wt 157.6 lb

## 2023-03-27 DIAGNOSIS — T451X5A Adverse effect of antineoplastic and immunosuppressive drugs, initial encounter: Secondary | ICD-10-CM | POA: Diagnosis not present

## 2023-03-27 DIAGNOSIS — K521 Toxic gastroenteritis and colitis: Secondary | ICD-10-CM

## 2023-03-27 DIAGNOSIS — R197 Diarrhea, unspecified: Secondary | ICD-10-CM | POA: Insufficient documentation

## 2023-03-27 DIAGNOSIS — D6481 Anemia due to antineoplastic chemotherapy: Secondary | ICD-10-CM | POA: Insufficient documentation

## 2023-03-27 DIAGNOSIS — Z86718 Personal history of other venous thrombosis and embolism: Secondary | ICD-10-CM | POA: Diagnosis not present

## 2023-03-27 DIAGNOSIS — C20 Malignant neoplasm of rectum: Secondary | ICD-10-CM

## 2023-03-27 DIAGNOSIS — N939 Abnormal uterine and vaginal bleeding, unspecified: Secondary | ICD-10-CM

## 2023-03-27 DIAGNOSIS — Z5189 Encounter for other specified aftercare: Secondary | ICD-10-CM | POA: Diagnosis not present

## 2023-03-27 DIAGNOSIS — C799 Secondary malignant neoplasm of unspecified site: Secondary | ICD-10-CM

## 2023-03-27 DIAGNOSIS — Z7901 Long term (current) use of anticoagulants: Secondary | ICD-10-CM | POA: Diagnosis not present

## 2023-03-27 DIAGNOSIS — Z86711 Personal history of pulmonary embolism: Secondary | ICD-10-CM | POA: Diagnosis not present

## 2023-03-27 DIAGNOSIS — Z5111 Encounter for antineoplastic chemotherapy: Secondary | ICD-10-CM | POA: Insufficient documentation

## 2023-03-27 DIAGNOSIS — C19 Malignant neoplasm of rectosigmoid junction: Secondary | ICD-10-CM | POA: Insufficient documentation

## 2023-03-27 DIAGNOSIS — C7951 Secondary malignant neoplasm of bone: Secondary | ICD-10-CM | POA: Diagnosis not present

## 2023-03-27 DIAGNOSIS — Z8542 Personal history of malignant neoplasm of other parts of uterus: Secondary | ICD-10-CM | POA: Insufficient documentation

## 2023-03-27 DIAGNOSIS — D701 Agranulocytosis secondary to cancer chemotherapy: Secondary | ICD-10-CM | POA: Insufficient documentation

## 2023-03-27 DIAGNOSIS — C7800 Secondary malignant neoplasm of unspecified lung: Secondary | ICD-10-CM | POA: Insufficient documentation

## 2023-03-27 DIAGNOSIS — E876 Hypokalemia: Secondary | ICD-10-CM | POA: Diagnosis not present

## 2023-03-27 DIAGNOSIS — Z87891 Personal history of nicotine dependence: Secondary | ICD-10-CM | POA: Diagnosis not present

## 2023-03-27 LAB — COMPREHENSIVE METABOLIC PANEL
ALT: 26 U/L (ref 0–44)
AST: 35 U/L (ref 15–41)
Albumin: 3.9 g/dL (ref 3.5–5.0)
Alkaline Phosphatase: 81 U/L (ref 38–126)
Anion gap: 6 (ref 5–15)
BUN: 14 mg/dL (ref 8–23)
CO2: 27 mmol/L (ref 22–32)
Calcium: 9 mg/dL (ref 8.9–10.3)
Chloride: 107 mmol/L (ref 98–111)
Creatinine, Ser: 0.79 mg/dL (ref 0.44–1.00)
GFR, Estimated: >60 mL/min (ref >60)
Glucose, Bld: 112 mg/dL — ABNORMAL HIGH (ref 70–99)
Potassium: 3.5 mmol/L (ref 3.5–5.1)
Sodium: 140 mmol/L (ref 135–145)
Total Bilirubin: 0.5 mg/dL (ref 0.3–1.2)
Total Protein: 6.7 g/dL (ref 6.5–8.1)

## 2023-03-27 LAB — CBC WITH DIFFERENTIAL/PLATELET
Abs Immature Granulocytes: 0.02 10*3/uL (ref 0.00–0.07)
Basophils Absolute: 0 10*3/uL (ref 0.0–0.1)
Basophils Relative: 0 %
Eosinophils Absolute: 0.2 10*3/uL (ref 0.0–0.5)
Eosinophils Relative: 3 %
HCT: 38.8 % (ref 36.0–46.0)
Hemoglobin: 12.1 g/dL (ref 12.0–15.0)
Immature Granulocytes: 0 %
Lymphocytes Relative: 17 %
Lymphs Abs: 1 10*3/uL (ref 0.7–4.0)
MCH: 29.5 pg (ref 26.0–34.0)
MCHC: 31.2 g/dL (ref 30.0–36.0)
MCV: 94.6 fL (ref 80.0–100.0)
Monocytes Absolute: 0.4 10*3/uL (ref 0.1–1.0)
Monocytes Relative: 7 %
Neutro Abs: 4.4 10*3/uL (ref 1.7–7.7)
Neutrophils Relative %: 73 %
Platelets: 202 10*3/uL (ref 150–400)
RBC: 4.1 MIL/uL (ref 3.87–5.11)
RDW: 16 % — ABNORMAL HIGH (ref 11.5–15.5)
WBC: 6 10*3/uL (ref 4.0–10.5)
nRBC: 0 % (ref 0.0–0.2)

## 2023-03-27 LAB — MAGNESIUM: Magnesium: 1.3 mg/dL — ABNORMAL LOW (ref 1.7–2.4)

## 2023-03-27 MED ORDER — SODIUM CHLORIDE 0.9 % IV SOLN
6.0000 mg/kg | Freq: Once | INTRAVENOUS | Status: AC
  Administered 2023-03-27: 440 mg via INTRAVENOUS
  Filled 2023-03-27: qty 2

## 2023-03-27 MED ORDER — HEPARIN SOD (PORK) LOCK FLUSH 100 UNIT/ML IV SOLN
INTRAVENOUS | Status: AC
  Filled 2023-03-27: qty 5

## 2023-03-27 MED ORDER — SODIUM CHLORIDE 0.9 % IV SOLN
Freq: Once | INTRAVENOUS | Status: AC
  Filled 2023-03-27: qty 250

## 2023-03-27 MED ORDER — PALONOSETRON HCL INJECTION 0.25 MG/5ML
0.2500 mg | Freq: Once | INTRAVENOUS | Status: AC
  Administered 2023-03-27: 0.25 mg via INTRAVENOUS
  Filled 2023-03-27: qty 5

## 2023-03-27 MED ORDER — MAGNESIUM SULFATE 4 GM/100ML IV SOLN
4.0000 g | Freq: Every day | INTRAVENOUS | Status: DC | PRN
  Administered 2023-03-27: 4 g via INTRAVENOUS
  Filled 2023-03-27: qty 100

## 2023-03-27 MED ORDER — DIPHENHYDRAMINE HCL 50 MG/ML IJ SOLN
25.0000 mg | Freq: Once | INTRAMUSCULAR | Status: AC
  Administered 2023-03-27: 25 mg via INTRAVENOUS
  Filled 2023-03-27: qty 1

## 2023-03-27 MED ORDER — ATROPINE SULFATE 1 MG/ML IV SOLN
0.5000 mg | Freq: Once | INTRAVENOUS | Status: AC
  Administered 2023-03-27: 0.5 mg via INTRAVENOUS
  Filled 2023-03-27: qty 1

## 2023-03-27 MED ORDER — DIPHENOXYLATE-ATROPINE 2.5-0.025 MG PO TABS: 1.0000 | ORAL_TABLET | Freq: Four times a day (QID) | ORAL | 0 refills | Status: DC | PRN

## 2023-03-27 MED ORDER — SODIUM CHLORIDE 0.9 % IV SOLN
100.0000 mg/m2 | Freq: Once | INTRAVENOUS | Status: AC
  Administered 2023-03-27: 180 mg via INTRAVENOUS
  Filled 2023-03-27: qty 5

## 2023-03-27 MED ORDER — SODIUM CHLORIDE 0.9 % IV SOLN
10.0000 mg | Freq: Once | INTRAVENOUS | Status: AC
  Administered 2023-03-27: 10 mg via INTRAVENOUS
  Filled 2023-03-27: qty 10

## 2023-03-27 MED ORDER — HEPARIN SOD (PORK) LOCK FLUSH 100 UNIT/ML IV SOLN
500.0000 [IU] | Freq: Once | INTRAVENOUS | Status: AC | PRN
  Administered 2023-03-27: 500 [IU]
  Filled 2023-03-27: qty 5

## 2023-03-27 NOTE — Assessment & Plan Note (Signed)
Stable, continue lomotil  QID PRN as instructed.  

## 2023-03-27 NOTE — Assessment & Plan Note (Signed)
Chemotherapy plan as listed above 

## 2023-03-27 NOTE — Patient Instructions (Signed)
[facility] [place] [facility]  Discharge Instructions: Thank you for choosing [facility] to provide your oncology and hematology care.  If you have a lab appointment with the Cancer Center, please go directly to the Cancer Center and check in at the registration area.  Wear comfortable clothing and clothing appropriate for easy access to any Portacath or PICC line.   We strive to give you quality time with your provider. You may need to reschedule your appointment if you arrive late (15 or more minutes).  Arriving late affects you and other patients whose appointments are after yours.  Also, if you miss three or more appointments without notifying the office, you may be dismissed from the clinic at the provider's discretion.      For prescription refill requests, have your pharmacy contact our office and allow 72 hours for refills to be completed.    Today you received the following chemotherapy and/or immunotherapy agents Vectibix & irinotecan    To help prevent nausea and vomiting after your treatment, we encourage you to take your nausea medication as directed.  BELOW ARE SYMPTOMS THAT SHOULD BE REPORTED IMMEDIATELY: *FEVER GREATER THAN 100.4 F (38 C) OR HIGHER *CHILLS OR SWEATING *NAUSEA AND VOMITING THAT IS NOT CONTROLLED WITH YOUR NAUSEA MEDICATION *UNUSUAL SHORTNESS OF BREATH *UNUSUAL BRUISING OR BLEEDING *URINARY PROBLEMS (pain or burning when urinating, or frequent urination) *BOWEL PROBLEMS (unusual diarrhea, constipation, pain near the anus) TENDERNESS IN MOUTH AND THROAT WITH OR WITHOUT PRESENCE OF ULCERS (sore throat, sores in mouth, or a toothache) UNUSUAL RASH, SWELLING OR PAIN  UNUSUAL VAGINAL DISCHARGE OR ITCHING   Items with * indicate a potential emergency and should be followed up as soon as possible or go to the Emergency Department if any problems should occur.  Please show the CHEMOTHERAPY ALERT CARD or IMMUNOTHERAPY ALERT CARD at  check-in to the Emergency Department and triage nurse.  Should you have questions after your visit or need to cancel or reschedule your appointment, please contact [place] [facility]  [phone] and follow the prompts.  Office hours are 8:00 a.m. to 4:30 p.m. Monday - Friday. Please note that voicemails left after 4:00 p.m. may not be returned until the following business day.  We are closed weekends and major holidays. You have access to a nurse at all times for urgent questions. Please call the main number to the clinic [phone] and follow the prompts.  For any non-urgent questions, you may also contact your provider using MyChart. We now offer e-Visits for anyone 20 and older to request care online for non-urgent symptoms. For details visit [email or web address].   Also download the MyChart app! Go to the app store, search "MyChart", open the app, select [place], and log in with your MyChart username and password.

## 2023-03-27 NOTE — Patient Instructions (Signed)
Implanted Port Insertion Implanted port insertion is a procedure to put in a port and catheter. The port is a device with an injectable disc that can be accessed by your health care provider. The port is connected to a vein in the chest or neck by a small, thin tube (catheter). There are different types of ports. The implanted port may be used as a long-term IV access for: Medicines, such as chemotherapy. Fluids. Liquid nutrition, such as total parenteral nutrition (TPN). When you have a port, your health care provider can choose to use the port instead of veins in your arms for these procedures. Tell a health care provider about: Any allergies you have. All medicines you are taking, especially blood thinners, as well as any vitamins, herbs, eye drops, creams, over-the-counter medicines, and steroids. Any problems you or family members have had with anesthetic medicines. Any bleeding problems you have. Any surgeries you have had. Any medical conditions you have or have had, including diabetes or kidney problems. Whether you are pregnant or may be pregnant. What are the risks? Generally, this is a safe procedure. However, problems may occur, including: Allergic reactions to medicines or dyes. Damage to other structures or organs. Infection. Damage to the blood vessel, bruising, or bleeding at the puncture site. Blood clot. Breakdown of the skin over the port. A collection of air in the chest that can cause one of the lungs to collapse (pneumothorax). This is rare. What happens before the procedure? When to stop eating and drinking Follow instructions from your health care provider about what you may eat and drink before your procedure. These may include: 8 hours before your procedure Stop eating most foods. Do not eat meat, fried foods, or fatty foods. Eat only light foods, such as toast or crackers. All liquids are okay except energy drinks and alcohol. 6 hours before your  procedure Stop eating. Drink only clear liquids, such as water, clear fruit juice, black coffee, plain tea, and sports drinks. Do not drink energy drinks or alcohol. 2 hours before your procedure Stop drinking all liquids. You may be allowed to take medicines with small sips of water. If you do not follow your health care provider's instructions, your procedure may be delayed or canceled. Medicines Ask your health care provider about: Changing or stopping your regular medicines. This is especially important if you are taking diabetes medicines or blood thinners. Taking medicines such as aspirin and ibuprofen. These medicines can thin your blood. Do not take these medicines unless your health care provider tells you to take them. Taking over-the-counter medicines, vitamins, herbs, and supplements. General instructions If you will be going home right after the procedure, plan to have a responsible adult: Take you home from the hospital or clinic. You will not be allowed to drive. Care for you for the time you are told. You may have blood tests. Do not use any products that contain nicotine or tobacco for at least 4 weeks before the procedure. These products include cigarettes, chewing tobacco, and vaping devices, such as e-cigarettes. If you need help quitting, ask your health care provider. Ask your health care provider what steps will be taken to help prevent infection. These may include: Removing hair at the surgery site. Washing skin with a germ-killing soap. Taking antibiotic medicine. What happens during the procedure?  An IV will be inserted into one of your veins. You will be given one or more of the following: A medicine to help you relax (sedative). A   medicine to numb the area (local anesthetic). Two small incisions will be made to insert the port. One smaller incision will be made in your neck to get access to the vein where the catheter will lie. The other incision will be  made in the upper chest. This is where the port will lie. The procedure may be done using continuous X-ray (fluoroscopy) or other imaging tools for guidance. The port and catheter will be placed. There may be a small, raised area where the port is placed. The port will be flushed with a saline solution, which is made of salt and water, and blood will be drawn to make sure that the port is working correctly. The incisions will be closed. Bandages (dressings) may be placed over the incisions. The procedure may vary among health care providers and hospitals. What happens after the procedure? Your blood pressure, heart rate, breathing rate, and blood oxygen level will be monitored until you leave the hospital or clinic. If you were given a sedative during the procedure, it can affect you for several hours. Do not drive or operate machinery until your health care provider says that it is safe. You will be given a manufacturer's information card for the type of port that you have. Keep this with you. Your port will need to be flushed and checked as told by your health care provider, usually every few weeks. A chest X-ray will be done to: Check the placement of the port. Make sure there is no injury to your lung. Summary Implanted port insertion is a procedure to put in a port and catheter. The implanted port is used as a long-term IV access. The port will need to be flushed and checked as told by your health care provider, usually every few weeks. Keep your manufacturer's information card with you at all times. This information is not intended to replace advice given to you by your health care provider. Make sure you discuss any questions you have with your health care provider. Document Revised: 04/05/2021 Document Reviewed: 04/05/2021 Elsevier Patient Education  2024 [employer]  

## 2023-03-27 NOTE — Assessment & Plan Note (Signed)
Continue  Eliquis 2.5 mg twice daily for anticoagulation prophylaxis. 

## 2023-03-27 NOTE — Assessment & Plan Note (Signed)
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 Discussed with GynOnc She is asymptomatic, recommend observation.  If she develops recurrent spotting or other symptoms, recommend palliative radiation.  

## 2023-03-27 NOTE — Assessment & Plan Note (Addendum)
History of stage IIIC Rectal cancer, s/p TNT, followed by 09/17/19 APR/posterior vaginectomy/TAH/BSO/VY-flap, pT4b pN0 with close vaginal margin 0.2 mm.  Uterus and ovaries negative for malignancy. palliative radiation to vaginal recurrence- 01/19/21 recurrence with lung metastasis.-Palliative -FOLFIRI plus bevacizumab.  Irinotecan was dropped in November 2022 due to side effects. Negative for UGT1A1*28 - radiographically stable, rise of CEA-July 2023 CT lung metastasis worse--> Dec 2023 PET showed progression in pelvic lymph nodes and bone lesions,--> 2nd line irinotecan +panitumumab-->CT March 2024 stable. --> June 2024 CT showed mild lung progression/Progressive left iliac bone metastasis  Labs are reviewed and discussed with patient, CEA nadir was in 500s, currently gradually increasing.  Proceed with irinotecan +panitumumab June 2024  CT chest abdomen pelvis discussed, given that she is asymptomatic, and there is limited option left, I recommend to stay on same regimen for now. Patient and daughter agree with the plan.

## 2023-03-27 NOTE — Progress Notes (Signed)
Hematology/Oncology Progress note [phone] [phone]      CHIEF COMPLAINTS/REASON FOR VISIT:  Follow up for rectal cancer  ASSESSMENT & PLAN:   Cancer Staging  Rectal cancer [facility]) Staging form: Colon and Rectum, AJCC 8th Edition - Pathologic stage from 10/06/2019: Stage IIC (ypT4b, pN0, cM0) - Signed by [name], MD on 10/06/2019 - Clinical stage from 06/30/2020: Jacqueline Yoder - Signed by [name], MD on 04/17/2022 - Pathologic: Stage Unknown (rpTX, pNX, cM1) - Signed by [name], MD on 01/31/2021   Rectal cancer [facility]) History of stage IIIC Rectal cancer, s/p TNT, followed by 09/17/19 APR/posterior vaginectomy/TAH/BSO/VY-flap, pT4b pN0 with close vaginal margin 0.2 mm.  Uterus and ovaries negative for malignancy. palliative radiation to vaginal recurrence- 01/19/21 recurrence with lung metastasis.-Palliative -FOLFIRI plus bevacizumab.  Irinotecan was dropped in November 2022 due to side effects. Negative for UGT1A1*28 - radiographically stable, rise of CEA-July 2023 CT lung metastasis worse--> Dec 2023 PET showed progression in pelvic lymph nodes and bone lesions,--> 2nd line irinotecan +panitumumab-->CT March 2024 stable. --> June 2024 CT showed mild lung progression/Progressive left iliac bone metastasis  Labs are reviewed and discussed with patient, CEA nadir was in 500s, currently gradually increasing.  Proceed with irinotecan +panitumumab June 2024  CT chest abdomen pelvis discussed, given that she is asymptomatic, and there is limited option left, I recommend to stay on same regimen for now. Patient and daughter agree with the plan.    Encounter for antineoplastic chemotherapy Chemotherapy plan as listed above.   Anemia due to antineoplastic chemotherapy Hb stable observation  Chemotherapy induced neutropenia (HCC) patient will receive long-acting GCSF prophylaxis on D3  Vaginal spotting likely recurrent rectal cancer involving vagina. Discussed with  GynOnc She is asymptomatic, recommend observation.  If she develops recurrent spotting or other symptoms, recommend palliative radiation.   History of pulmonary embolism Continue  Eliquis 2.5 mg twice daily for anticoagulation prophylaxis.  Chemotherapy induced diarrhea Stable, continue lomotil  QID PRN as instructed.   Hypokalemia  potassium is stable.  Continue potassium chloride 20 mEq  daily.   Hypomagnesemia If magnesium is 1.0 - 1.2:  administrate 6 gm IV magnesium sulfate If magnesium is 1.2-1.4, administrate 4 gm IV magnesium sulfate If magnesium is 1.5-1.6:  administrate 2 gm IV magnesium sulfate.  Continue slow mag orally 64mg  twice daily.    Follow up 2 weeks Lab MD irinotecan + panitumumab All questions were answered. The patient knows to call the clinic with any problems, questions or concerns.  [name], MD, PhD [facility] Hematology Oncology 03/27/2023      HISTORY OF PRESENTING ILLNESS:  Patient initially presented with complaints of postmenopausal bleeding on 08/16/2018.  History of was menopausal vaginal bleeding in 2016 which resulted in cervical polypectomy.  Pathology 02/04/2015 showed cervical polyp, consistent with benign endometrial polyp.  [name] after polypectomy due to anxiety associated with pelvic exams.  pelvic exam on 08/16/2018 reviewed cervical abnormality and from enlarged uterus. Seen by Dr. [name] on 10/29/2018.  Endometrial biopsy and a Pap smear was performed. 10/29/2018 Pap smear showed adenocarcinoma, favor endometrial origin. 10/29/2018 endometrial biopsy showed endometrioid carcinoma, FIGO grade 1.  10/29/2018- TA & TV Ultrasound revealed: Anteverted uterus measuring 8.7 x 5.6 x 6.4 cm without evidence of focal masses.  The endometrium measuring 24.1 mm (thickened) and heterogeneous.  Right and left ovaries not visualized.  No adnexal masses identified.  No free fluid in cul-de-sac.  Patient was seen by Dr. [name] in clinic  on  11/13/2018.  Cervical exam reveals 2 cm exophytic irregular mass consistent with malignancy.   11/19/2018 CT chest abdomen pelvis with contrast showed thickened endometrium with some irregularity compatible with the provided diagnosis of endometrial malignancy.  There is a mildly prominent left inguinal node 1.4 cm.  Patient was seen by Dr. [name] on 11/20/2018 and left groin lymph node biopsy was recommended.  11/26/2018 patient underwent left inguinal lymph node biopsy. Pathology showed metastatic adenocarcinoma consistent with colorectal origin.  CDX 2+.  Case was discussed on tumor board.  Recommend colonoscopy for further evaluation.  Patient reports significant weight loss 30 pounds over the last year.  Chronic vaginal spotting. Change of bowel habits the past few months.  More constipated.  Family history positive for brother who has colon cancer prostate cancer.  patient has underwent colonoscopy on 12/03/2018 which reviewed a nonobstructing large mass in the rectum.  Also chronic fistula.  Mass was not circumferential.  This was biopsied with a cold forceps for histology.  Pathology came back hyperplastic polyp negative for dysplasia and malignancy. [name], patient underwent flex sigmoidoscopy on 12/06/2018 with rebiopsy of the rectal mass. This time biopsy results came back positive for invasive colorectal adenocarcinoma, moderately differentiated. Immunotherapy for nearly mismatch repair protein (MMR ) was performed.  There is no loss of MMR expression.  low probability of MSI high.   # Seen by [name] for evaluation of resectability for rectal cancer. In addition, she also had a second opinion with [facility] pathology where her endometrial biopsy pathology was changed to  adenocarcinoma, consistent with colorectal primary.   [name]. She has home health that has been assisting with ostomy care  Patient was also evaluated by  [facility] oncology.  Recommendation is to proceed with TNT with concurrent chemoradiation followed by neoadjuvant chemotherapy followed by surgical resection. Patient prefers to have treatment done locally with [facility].   # Oncology Treatment:  02/03/2019- 03/19/2019  concurrent Xeloda and radiation.  Xeloda dose 825mg  /m2 BID - rounded to 1650mg  BID- on days of radiation. 04/09/2019, started on FOLFOX with bolus early.  Omitted.  07/16/2019 finished 8 cycles of FOLFOX. 09/17/19 APR/posterior vaginectomy/TAH/BSO/VY-flap pT4b pN0 with close vaginal margin 0.2 mm.  Uterus and ovaries negative for malignancy. Patient reports bilateral lower extremity numbness and tingling, intermittent, left worse than right. She has lost a lot of weight since her APR surgery.   #Family history with half brother having's history of colon cancer prostate cancer.  Personal history of colorectal cancer.  Patient has not decided if she wants genetic testing.    # history of PE( 01/13/2020)  in the bilateral lower extremity DVT (01/13/2020).   She finishes 6 months of anticoagulation with Eliquis 5 mg twice daily. Now switched to Eliquis 2.5 mg twice daily..  # She has now developed recurrent disease. #06/30/20  vaginal introitus mass biopsied. Pathology is consistent with metastatic colorectal adenocarcinoma I have discussed with [name]  Dr. Luciano Cutter and the mass is not resectable. Patient has also had colonoscopy by Dr. Tobi Bastos yesterday. Normal examination. # 07/16/2020 cycle 1 FOLFIRI  # 07/20/2020 PET scan was done for further evaluation, images are consistent with local recurrence, no distant metastasis. #Discussed with radiation oncology Dr. Rushie Chestnut will recommends concurrent chemotherapy and radiation. 08/02/2020-08/16/2020, patient starts radiation.  Xeloda was held due to neutropenia 08/17/2020,-09/06/2020 Xeloda 1500 mg twice daily concurrently with radiation  01/31/21 started on FOLFIRI + Bev 05/18/2021 CT chest  abdomen  pelvis showed Previously noted enlargement of bilateral inguinal lymph nodes is resolved, consistent with treatment response of nodal metastatic disease. Interval decrease in size of multiple small bilateral pulmonary nodules, consistent with treatment response of pulmonary metastatic disease. No evidence of new metastatic disease. 05/24/2021 - 08/30/2021, continued on FOLFIRI plus bevacizumab.  Irinotecan dose was reduced, eventually 100mg /m2  09/02/2021, CT chest abdomen pelvis without contrast Showed small bilateral pulmonary nodules, unchanged.  Stable metastatic disease.  No noncontrast evidence of new metastatic disease in the chest abdomen pelvis.  Small parastomal hernia.  Enlargement of main pulmonary artery.  Coronary artery disease.  09/13/2021, maintenance 5-FU/bevacizumab 11/28/2021, 5-FU/Irinotecan/bevacizumab.  Irinotecan 100 mg/m2 was added back due to progressively increasing CEA.    INTERVAL HISTORY Jacqueline Yoder is a 77 y.o. female who has above history reviewed by me presents for follow-up of rectal cancer. On Irinotecan and panitumumab. she tolerates well.   She was accompanied by her daughter. - chronic diarrhea is manageable.  -she denies any additional episode of vaginal spotting since last visit.  Review of Systems  Constitutional:  Positive for fatigue. Negative for appetite change, chills, fever and unexpected weight change.  HENT:   Negative for hearing loss and voice change.   Eyes:  Negative for eye problems.  Respiratory:  Negative for chest tightness and cough.   Cardiovascular:  Negative for chest pain.  Gastrointestinal:  Negative for abdominal distention, abdominal pain, blood in stool, constipation, diarrhea and nausea.  Endocrine: Negative for hot flashes.  Genitourinary:  Negative for difficulty urinating and frequency.   Musculoskeletal:  Positive for arthralgias.  Skin:  Positive for rash. Negative for itching.  Neurological:  Negative for extremity  weakness and numbness.  Hematological:  Negative for adenopathy.  Psychiatric/Behavioral:  Negative for confusion.     MEDICAL HISTORY:  Past Medical History:  Diagnosis Date   Allergy    Arthritis    Blood clot in vein    Family history of colon cancer    GERD (gastroesophageal reflux disease)    Hypercholesteremia    Hypertension    Hypertension    Lower extremity edema    Personal history of chemotherapy    Rectal cancer (HCC) 12/2018   Urinary incontinence     SURGICAL HISTORY: Past Surgical History:  Procedure Laterality Date   ABDOMINAL HYSTERECTOMY     CHOLECYSTECTOMY  1971   COLONOSCOPY WITH PROPOFOL N/A 12/03/2018   Procedure: COLONOSCOPY WITH PROPOFOL;  Surgeon: Midge Minium, MD;  Location: [facility] ENDOSCOPY;  Service: Endoscopy;  Laterality: N/A;   COLONOSCOPY WITH PROPOFOL N/A 07/15/2020   Procedure: COLONOSCOPY WITH PROPOFOL;  Surgeon: Wyline Mood, MD;  Location: [facility] ENDOSCOPY;  Service: Gastroenterology;  Laterality: N/A;   FLEXIBLE SIGMOIDOSCOPY N/A 12/06/2018   Procedure: FLEXIBLE SIGMOIDOSCOPY;  Surgeon: Wyline Mood, MD;  Location: [facility] ENDOSCOPY;  Service: Endoscopy;  Laterality: N/A;   LAPAROSCOPIC COLOSTOMY  01/06/2019   PORTACATH PLACEMENT N/A 04/03/2019   Procedure: INSERTION PORT-A-CATH;  Surgeon: Leafy Ro, MD;  Location: [facility] ORS;  Service: General;  Laterality: N/A;    SOCIAL HISTORY: Social History   Socioeconomic History   Marital status: Widowed    Spouse name: Not on file   Number of children: Not on file   Years of education: Not on file   Highest education level: Not on file  Occupational History   Not on file  Tobacco Use   Smoking status: Former    Types: Cigarettes    Quit date: 12/02/1977  Years since quitting: 45.3   Smokeless tobacco: Former  Building services engineer Use: Never used  Substance and Sexual Activity   Alcohol use: Never   Drug use: Never   Sexual activity: Not Currently    Birth control/protection: None   Other Topics Concern   Not on file  Social History Narrative   Lives with daughter   Social Determinants of Health   Financial Resource Strain: Medium Risk (06/30/2022)   Overall Financial Resource Strain (CARDIA)    Difficulty of Paying Living Expenses: Somewhat hard  Food Insecurity: No Food Insecurity (06/30/2022)   Hunger Vital Sign    Worried About Running Out of Food in the Last Year: Never true    Ran Out of Food in the Last Year: Never true  Transportation Needs: No Transportation Needs (06/30/2022)   PRAPARE - Administrator, Civil Service (Medical): No    Lack of Transportation (Non-Medical): No  Physical Activity: Inactive (06/30/2022)   Exercise Vital Sign    Days of Exercise per Week: 0 days    Minutes of Exercise per Session: 0 min  Stress: No Stress Concern Present (06/30/2022)   [employer] of Occupational Health - Occupational Stress Questionnaire    Feeling of Stress : Not at all  Social Connections: Moderately Isolated (06/30/2022)   Social Connection and Isolation Panel [NHANES]    Frequency of Communication with Friends and Family: More than three times a week    Frequency of Social Gatherings with Friends and Family: Twice a week    Attends Religious Services: More than 4 times per year    Active Member of [employer] or Organizations: No    Attends Banker Meetings: Never    Marital Status: Widowed  Intimate Partner Violence: Not At Risk (06/30/2022)   Humiliation, Afraid, Rape, and Kick questionnaire    Fear of Current or Ex-Partner: No    Emotionally Abused: No    Physically Abused: No    Sexually Abused: No    FAMILY HISTORY: Family History  Problem Relation Age of Onset   Colon cancer Brother 5       exposure to chemicals [place]   Hypertension Mother    Stroke Mother    Kidney failure Father    Breast cancer Neg Hx    Ovarian cancer Neg Hx     ALLERGIES:  is allergic to sulfamethoxazole-trimethoprim.  MEDICATIONS:   Current Outpatient Medications  Medication Sig Dispense Refill   Cholecalciferol (VITAMIN D3) 2000 units capsule Take 2,000 Units by mouth daily.     cholestyramine (QUESTRAN) 4 g packet Take 1 packet (4 g total) by mouth 3 (three) times daily. 90 each 1   clindamycin (CLINDAGEL) 1 % gel Apply topically 2 (two) times daily. 30 g 5   diclofenac sodium (VOLTAREN) 1 % GEL Apply 2 g topically 4 (four) times daily as needed (joint pain).  11   ELIQUIS 2.5 MG TABS tablet TAKE 1 TABLET BY MOUTH TWICE  DAILY 200 tablet 2   fluticasone (FLONASE) 50 MCG/ACT nasal spray USE 1 SPRAY IN EACH NOSTRIL ONCE DAILY 16 g 3   ipratropium (ATROVENT) 0.03 % nasal spray Place 1 spray into both nostrils 2 (two) times daily.     ketoconazole (NIZORAL) 2 % cream Apply 1 Application topically daily. For up to 2 weeks. May repeat if need. 30 g 2   lidocaine-prilocaine (EMLA) cream Apply 1 application. topically as needed. 30 g 6   loperamide (IMODIUM)  2 MG capsule Take 1 capsule (2 mg total) by mouth See admin instructions. With onset of loose stool, take 4mg  followed by 2mg  every 2 hours,  Maximum: 16 mg/day 120 capsule 1   loratadine (CLARITIN) 10 MG tablet Take 10 mg by mouth daily.     magnesium chloride (SLOW-MAG) 64 MG TBEC SR tablet Take 1 tablet (64 mg total) by mouth 2 (two) times daily. 60 tablet 2   Multiple Vitamins-Minerals (ONE-A-DAY WOMENS 50 PLUS PO) Take 1 tablet by mouth daily.      nystatin (MYCOSTATIN/NYSTOP) powder Apply 1 Application topically 3 (three) times daily. 45 g 0   potassium chloride SA (KLOR-CON M) 20 MEQ tablet TAKE 1 TABLET BY MOUTH DAILY 90 tablet 1   simvastatin (ZOCOR) 40 MG tablet Take 1 tablet (40 mg total) by mouth at bedtime. 100 tablet 3   triamcinolone cream (KENALOG) 0.5 % Apply 1 Application topically 2 (two) times daily. To affected areas, for up to 2 weeks. 30 g 2   triamterene-hydrochlorothiazide (DYAZIDE) 37.5-25 MG capsule Take 1 each (1 capsule total) by mouth daily. 100  capsule 3   zinc gluconate 50 MG tablet Take 50 mg by mouth daily.     diphenoxylate-atropine (LOMOTIL) 2.5-0.025 MG tablet Take 1 tablet by mouth 4 (four) times daily as needed for diarrhea or loose stools. 120 tablet 0   No current facility-administered medications for this visit.   Facility-Administered Medications Ordered in Other Visits  Medication Dose Route Frequency Provider Last Rate Last Admin   heparin lock flush 100 UNIT/ML injection            magnesium sulfate IVPB 4 g 100 mL  4 g Intravenous Daily PRN [name], MD 50 mL/hr at 03/27/23 1045 4 g at 03/27/23 1045     PHYSICAL EXAMINATION: ECOG PERFORMANCE STATUS: 1 - Symptomatic but completely ambulatory  Physical Exam Constitutional:      General: She is not in acute distress. HENT:     Head: Normocephalic and atraumatic.  Eyes:     General: No scleral icterus. Cardiovascular:     Rate and Rhythm: Normal rate.     Heart sounds: Normal heart sounds.  Pulmonary:     Effort: Pulmonary effort is normal. No respiratory distress.     Breath sounds: No wheezing.  Abdominal:     General: Bowel sounds are normal. There is no distension.     Palpations: Abdomen is soft.     Comments: + Colostomy bag   Musculoskeletal:        General: No deformity. Normal range of motion.     Cervical back: Normal range of motion and neck supple.  Skin:    General: Skin is warm and dry.  Neurological:     Mental Status: She is alert and oriented to person, place, and time. Mental status is at baseline.     Cranial Nerves: No cranial nerve deficit.     Coordination: Coordination normal.       LABORATORY DATA:  I have reviewed the data as listed    Latest Ref Rng & Units 03/27/2023    8:47 AM 03/13/2023    9:46 AM 02/27/2023    8:38 AM  CBC  WBC 4.0 - 10.5 K/uL 6.0  4.8  5.9   Hemoglobin 12.0 - 15.0 g/dL 81.8  29.9  37.1   Hematocrit 36.0 - 46.0 % 38.8  38.7  37.3   Platelets 150 - 400 K/uL 202  197  215  Latest Ref Rng  & Units 03/27/2023    8:47 AM 03/13/2023    9:46 AM 02/27/2023    8:38 AM  CMP  Glucose 70 - 99 mg/dL 161  096  94   BUN 8 - 23 mg/dL 14  13  18    Creatinine 0.44 - 1.00 mg/dL 0.45  4.09  8.11   Sodium 135 - 145 mmol/L 140  139  140   Potassium 3.5 - 5.1 mmol/L 3.5  3.5  3.8   Chloride 98 - 111 mmol/L 107  102  103   CO2 22 - 32 mmol/L 27  29  28    Calcium 8.9 - 10.3 mg/dL 9.0  8.7  8.7   Total Protein 6.5 - 8.1 g/dL 6.7  6.8  6.6   Total Bilirubin 0.3 - 1.2 mg/dL 0.5  0.3  0.4   Alkaline Phos 38 - 126 U/L 81  75  79   AST 15 - 41 U/L 35  37  37   ALT 0 - 44 U/L 26  24  24       RADIOGRAPHIC STUDIES: I have personally reviewed the radiological images as listed and agreed with the findings in the report. CT CHEST ABDOMEN PELVIS W CONTRAST  Result Date: 03/26/2023 CLINICAL DATA:  Follow-up metastatic rectal cancer EXAM: CT CHEST, ABDOMEN, AND PELVIS WITH CONTRAST TECHNIQUE: Multidetector CT imaging of the chest, abdomen and pelvis was performed following the standard protocol during bolus administration of intravenous contrast. RADIATION DOSE REDUCTION: This exam was performed according to the departmental dose-optimization program which includes automated exposure control, adjustment of the mA and/or kV according to patient size and/or use of iterative reconstruction technique. CONTRAST:  100 mL OMNIPAQUE IOHEXOL 300 MG/ML  SOLN COMPARISON:  CT chest abdomen pelvis dated 12/25/2022. PET-CT dated 09/19/2022. FINDINGS: CT CHEST FINDINGS Cardiovascular: The heart is normal in size. No pericardial effusion. No evidence of thoracic aortic aneurysm. Atherosclerotic calcifications of the aortic arch. Mild three-vessel coronary atherosclerosis. Right chest port terminates in the upper right atrium. Mediastinum/Nodes: No suspicious mediastinal lymphadenopathy. Visualized thyroid is unremarkable. Lungs/Pleura: Scattered pulmonary nodules/metastases, including a dominant bilobed nodule in the medial right  middle lobe measuring 14 x 11 mm (series 4/image 94), previously 10 x 7 mm. Mild centrilobular and paraseptal emphysematous changes, upper lung predominant. No focal consolidation. No pleural effusion pneumothorax. Musculoskeletal: Mild degenerative changes of the thoracic spine. Stable deformity of the left inferior scapula (series 2/image 32). No focal osseous lesions. Proximal right humerus metastasis is not well visualized on the current study. CT ABDOMEN PELVIS FINDINGS Hepatobiliary: Subcapsular low-density in the anterior/inferior right hepatic lobe (series 2/image 68), chronic, non FDG avid on prior PET-CT, benign. No suspicious hepatic lesions. Status post cholecystectomy. Mild to moderate central intrahepatic ductal dilatation, possibly mildly progressive, but present on multiple priors and favored to be postsurgical. Dilated common duct, measuring 10 mm (coronal image 31), and smoothly tapering at the ampulla. Pancreas: Within normal limits. Spleen: 12 mm cyst in the posterior spleen (series 2/image 57), benign. Adrenals/Urinary Tract: Adrenal glands are normal limits. Bilateral renal cysts, measuring up to 5.9 cm in the right upper kidney (series 2/image 56) and 5.9 cm in the left lower kidney (series 2/image 76), benign (Bosniak I). No hydronephrosis. Bladder is within normal limits. Stomach/Bowel: Stomach is within normal limits. No evidence of bowel obstruction. Normal appendix (series 2/image 84). Status post abdominoperineal resection with mid transverse colostomy. Vascular/Lymphatic: No evidence of abdominal aortic aneurysm. Atherosclerotic calcifications of the abdominal aorta  and branch vessels. 6 mm short axis right external iliac node (series 2/image 105), previously 7 mm. 11 mm short axis right inguinal node (series 2/image 102), previously 10 mm. Reproductive: Status post hysterectomy with bilateral salpingo oophorectomy. Other: No abdominopelvic ascites. Postsurgical changes involving the  anterior abdominal wall. Small fat containing parastomal hernia (series 2/image 81). 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 Degenerative changes of the visualized thoracolumbar spine. IMPRESSION: Status post abdominoperineal resection with mid transverse colostomy. Progressive pulmonary metastases, as above. Small right pelvic nodal metastases, grossly unchanged. Progressive left iliac bone metastasis, as above. Electronically Signed   By: Charline Bills M.D.   On: 03/26/2023 23:43   MM 3D SCREEN BREAST BILATERAL  Result Date: 01/22/2023 CLINICAL DATA:  Screening. EXAM: DIGITAL SCREENING BILATERAL MAMMOGRAM WITH TOMOSYNTHESIS AND CAD TECHNIQUE: Bilateral screening digital craniocaudal and mediolateral oblique mammograms were obtained. Bilateral screening digital breast tomosynthesis was performed. The images were evaluated with computer-aided detection. COMPARISON:  Previous exam(s). ACR Breast Density Category b: There are scattered areas of fibroglandular density. FINDINGS: There are no findings suspicious for malignancy. IMPRESSION: No mammographic evidence of malignancy. A result letter of this screening mammogram will be mailed directly to the patient. RECOMMENDATION: Screening mammogram in one year. (Code:SM-B-01Y) BI-RADS CATEGORY  1: Negative. Electronically Signed   By: Bary Richard M.D.   On: 01/22/2023 11:01      CT CHEST ABDOMEN PELVIS W CONTRAST  Result Date: 03/26/2023 CLINICAL DATA:  Follow-up metastatic rectal cancer EXAM: CT CHEST, ABDOMEN, AND PELVIS WITH CONTRAST TECHNIQUE: Multidetector CT imaging of the chest, abdomen and pelvis was performed following the standard protocol during bolus administration of intravenous contrast. RADIATION DOSE REDUCTION: This exam was performed according to the departmental dose-optimization program which includes automated exposure control, adjustment of the mA and/or kV  according to patient size and/or use of iterative reconstruction technique. CONTRAST:  100 mL OMNIPAQUE IOHEXOL 300 MG/ML  SOLN COMPARISON:  CT chest abdomen pelvis dated 12/25/2022. PET-CT dated 09/19/2022. FINDINGS: CT CHEST FINDINGS Cardiovascular: The heart is normal in size. No pericardial effusion. No evidence of thoracic aortic aneurysm. Atherosclerotic calcifications of the aortic arch. Mild three-vessel coronary atherosclerosis. Right chest port terminates in the upper right atrium. Mediastinum/Nodes: No suspicious mediastinal lymphadenopathy. Visualized thyroid is unremarkable. Lungs/Pleura: Scattered pulmonary nodules/metastases, including a dominant bilobed nodule in the medial right middle lobe measuring 14 x 11 mm (series 4/image 94), previously 10 x 7 mm. Mild centrilobular and paraseptal emphysematous changes, upper lung predominant. No focal consolidation. No pleural effusion pneumothorax. Musculoskeletal: Mild degenerative changes of the thoracic spine. Stable deformity of the left inferior scapula (series 2/image 32). No focal osseous lesions. Proximal right humerus metastasis is not well visualized on the current study. CT ABDOMEN PELVIS FINDINGS Hepatobiliary: Subcapsular low-density in the anterior/inferior right hepatic lobe (series 2/image 68), chronic, non FDG avid on prior PET-CT, benign. No suspicious hepatic lesions. Status post cholecystectomy. Mild to moderate central intrahepatic ductal dilatation, possibly mildly progressive, but present on multiple priors and favored to be postsurgical. Dilated common duct, measuring 10 mm (coronal image 31), and smoothly tapering at the ampulla. Pancreas: Within normal limits. Spleen: 12 mm cyst in the posterior spleen (series 2/image 57), benign. Adrenals/Urinary Tract: Adrenal glands are normal limits. Bilateral renal cysts, measuring up to 5.9 cm in the right upper kidney (series 2/image 56) and 5.9 cm in the left lower kidney (series 2/image  76), benign (Bosniak I). No hydronephrosis. Bladder is within normal  limits. Stomach/Bowel: Stomach is within normal limits. No evidence of bowel obstruction. Normal appendix (series 2/image 84). Status post abdominoperineal resection with mid transverse colostomy. Vascular/Lymphatic: No evidence of abdominal aortic aneurysm. Atherosclerotic calcifications of the abdominal aorta and branch vessels. 6 mm short axis right external iliac node (series 2/image 105), previously 7 mm. 11 mm short axis right inguinal node (series 2/image 102), previously 10 mm. Reproductive: Status post hysterectomy with bilateral salpingo oophorectomy. Other: No abdominopelvic ascites. Postsurgical changes involving the anterior abdominal wall. Small fat containing parastomal hernia (series 2/image 81). 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 Degenerative changes of the visualized thoracolumbar spine. IMPRESSION: Status post abdominoperineal resection with mid transverse colostomy. Progressive pulmonary metastases, as above. Small right pelvic nodal metastases, grossly unchanged. Progressive left iliac bone metastasis, as above. Electronically Signed   By: Charline Bills M.D.   On: 03/26/2023 23:43   MM 3D SCREEN BREAST BILATERAL  Result Date: 01/22/2023 CLINICAL DATA:  Screening. EXAM: DIGITAL SCREENING BILATERAL MAMMOGRAM WITH TOMOSYNTHESIS AND CAD TECHNIQUE: Bilateral screening digital craniocaudal and mediolateral oblique mammograms were obtained. Bilateral screening digital breast tomosynthesis was performed. The images were evaluated with computer-aided detection. COMPARISON:  Previous exam(s). ACR Breast Density Category b: There are scattered areas of fibroglandular density. FINDINGS: There are no findings suspicious for malignancy. IMPRESSION: No mammographic evidence of malignancy. A result letter of this screening mammogram will be mailed directly to  the patient. RECOMMENDATION: Screening mammogram in one year. (Code:SM-B-01Y) BI-RADS CATEGORY  1: Negative. Electronically Signed   By: Bary Richard M.D.   On: 01/22/2023 11:01

## 2023-03-27 NOTE — Assessment & Plan Note (Signed)
Hb stable observation 

## 2023-03-27 NOTE — Assessment & Plan Note (Signed)
potassium is stable.  Continue potassium chloride 20 mEq  daily.  

## 2023-03-27 NOTE — Assessment & Plan Note (Signed)
patient will receive long-acting GCSF prophylaxis on D3 

## 2023-03-27 NOTE — Assessment & Plan Note (Signed)
If magnesium is 1.0 - 1.2:  administrate 6 gm IV magnesium sulfate If magnesium is 1.2-1.4, administrate 4 gm IV magnesium sulfate If magnesium is 1.5-1.6:  administrate 2 gm IV magnesium sulfate.  Continue slow mag orally 64mg twice daily.  

## 2023-03-28 ENCOUNTER — Other Ambulatory Visit: Payer: Self-pay | Admitting: Oncology

## 2023-03-28 DIAGNOSIS — Z933 Colostomy status: Secondary | ICD-10-CM | POA: Diagnosis not present

## 2023-03-28 LAB — CEA: CEA: 909 ng/mL — ABNORMAL HIGH (ref 0.0–4.7)

## 2023-03-28 MED ORDER — NYSTATIN 100000 UNIT/GM EX POWD: 1.0000 | Freq: Three times a day (TID) | CUTANEOUS | 0 refills | Status: DC

## 2023-03-29 ENCOUNTER — Inpatient Hospital Stay: Payer: 59

## 2023-03-29 DIAGNOSIS — C20 Malignant neoplasm of rectum: Secondary | ICD-10-CM

## 2023-03-29 DIAGNOSIS — D6481 Anemia due to antineoplastic chemotherapy: Secondary | ICD-10-CM | POA: Diagnosis not present

## 2023-03-29 DIAGNOSIS — Z7901 Long term (current) use of anticoagulants: Secondary | ICD-10-CM | POA: Diagnosis not present

## 2023-03-29 DIAGNOSIS — Z87891 Personal history of nicotine dependence: Secondary | ICD-10-CM | POA: Diagnosis not present

## 2023-03-29 DIAGNOSIS — R197 Diarrhea, unspecified: Secondary | ICD-10-CM | POA: Diagnosis not present

## 2023-03-29 DIAGNOSIS — Z5111 Encounter for antineoplastic chemotherapy: Secondary | ICD-10-CM | POA: Diagnosis not present

## 2023-03-29 DIAGNOSIS — C799 Secondary malignant neoplasm of unspecified site: Secondary | ICD-10-CM

## 2023-03-29 DIAGNOSIS — C7951 Secondary malignant neoplasm of bone: Secondary | ICD-10-CM | POA: Diagnosis not present

## 2023-03-29 DIAGNOSIS — Z5189 Encounter for other specified aftercare: Secondary | ICD-10-CM | POA: Diagnosis not present

## 2023-03-29 DIAGNOSIS — C7800 Secondary malignant neoplasm of unspecified lung: Secondary | ICD-10-CM | POA: Diagnosis not present

## 2023-03-29 DIAGNOSIS — D701 Agranulocytosis secondary to cancer chemotherapy: Secondary | ICD-10-CM | POA: Diagnosis not present

## 2023-03-29 DIAGNOSIS — E876 Hypokalemia: Secondary | ICD-10-CM | POA: Diagnosis not present

## 2023-03-29 DIAGNOSIS — Z86718 Personal history of other venous thrombosis and embolism: Secondary | ICD-10-CM | POA: Diagnosis not present

## 2023-03-29 DIAGNOSIS — C19 Malignant neoplasm of rectosigmoid junction: Secondary | ICD-10-CM | POA: Diagnosis not present

## 2023-03-29 DIAGNOSIS — Z86711 Personal history of pulmonary embolism: Secondary | ICD-10-CM | POA: Diagnosis not present

## 2023-03-29 MED ORDER — PEGFILGRASTIM INJECTION 6 MG/0.6ML ~~LOC~~
6.0000 mg | PREFILLED_SYRINGE | Freq: Once | SUBCUTANEOUS | Status: AC
  Administered 2023-03-29: 6 mg via SUBCUTANEOUS
  Filled 2023-03-29: qty 0.6

## 2023-03-30 DIAGNOSIS — Z933 Colostomy status: Secondary | ICD-10-CM | POA: Diagnosis not present

## 2023-04-09 MED FILL — Dexamethasone Sodium Phosphate Inj 100 MG/10ML: INTRAMUSCULAR | Qty: 1 | Status: AC

## 2023-04-10 ENCOUNTER — Inpatient Hospital Stay: Payer: 59

## 2023-04-10 ENCOUNTER — Inpatient Hospital Stay (HOSPITAL_BASED_OUTPATIENT_CLINIC_OR_DEPARTMENT_OTHER): Payer: 59 | Admitting: Oncology

## 2023-04-10 ENCOUNTER — Encounter: Payer: Self-pay | Admitting: Oncology

## 2023-04-10 ENCOUNTER — Other Ambulatory Visit: Payer: Self-pay

## 2023-04-10 VITALS — BP 131/69 | HR 98 | Temp 98.1°F | Resp 18 | Ht 64.0 in | Wt 157.8 lb

## 2023-04-10 VITALS — BP 112/56 | HR 83 | Temp 97.8°F | Resp 17

## 2023-04-10 DIAGNOSIS — C20 Malignant neoplasm of rectum: Secondary | ICD-10-CM

## 2023-04-10 DIAGNOSIS — K521 Toxic gastroenteritis and colitis: Secondary | ICD-10-CM | POA: Diagnosis not present

## 2023-04-10 DIAGNOSIS — Z86711 Personal history of pulmonary embolism: Secondary | ICD-10-CM

## 2023-04-10 DIAGNOSIS — T451X5A Adverse effect of antineoplastic and immunosuppressive drugs, initial encounter: Secondary | ICD-10-CM | POA: Diagnosis not present

## 2023-04-10 DIAGNOSIS — C799 Secondary malignant neoplasm of unspecified site: Secondary | ICD-10-CM | POA: Diagnosis not present

## 2023-04-10 DIAGNOSIS — C7951 Secondary malignant neoplasm of bone: Secondary | ICD-10-CM | POA: Diagnosis not present

## 2023-04-10 DIAGNOSIS — Z7901 Long term (current) use of anticoagulants: Secondary | ICD-10-CM | POA: Diagnosis not present

## 2023-04-10 DIAGNOSIS — D701 Agranulocytosis secondary to cancer chemotherapy: Secondary | ICD-10-CM | POA: Diagnosis not present

## 2023-04-10 DIAGNOSIS — C7800 Secondary malignant neoplasm of unspecified lung: Secondary | ICD-10-CM | POA: Diagnosis not present

## 2023-04-10 DIAGNOSIS — Z5189 Encounter for other specified aftercare: Secondary | ICD-10-CM | POA: Diagnosis not present

## 2023-04-10 DIAGNOSIS — Z5111 Encounter for antineoplastic chemotherapy: Secondary | ICD-10-CM

## 2023-04-10 DIAGNOSIS — D6481 Anemia due to antineoplastic chemotherapy: Secondary | ICD-10-CM

## 2023-04-10 DIAGNOSIS — C19 Malignant neoplasm of rectosigmoid junction: Secondary | ICD-10-CM | POA: Diagnosis not present

## 2023-04-10 DIAGNOSIS — Z86718 Personal history of other venous thrombosis and embolism: Secondary | ICD-10-CM | POA: Diagnosis not present

## 2023-04-10 DIAGNOSIS — R197 Diarrhea, unspecified: Secondary | ICD-10-CM | POA: Diagnosis not present

## 2023-04-10 DIAGNOSIS — E876 Hypokalemia: Secondary | ICD-10-CM | POA: Diagnosis not present

## 2023-04-10 DIAGNOSIS — Z87891 Personal history of nicotine dependence: Secondary | ICD-10-CM | POA: Diagnosis not present

## 2023-04-10 LAB — COMPREHENSIVE METABOLIC PANEL
ALT: 24 U/L (ref 0–44)
AST: 35 U/L (ref 15–41)
Albumin: 3.8 g/dL (ref 3.5–5.0)
Alkaline Phosphatase: 82 U/L (ref 38–126)
Anion gap: 8 (ref 5–15)
BUN: 14 mg/dL (ref 8–23)
CO2: 29 mmol/L (ref 22–32)
Calcium: 8.8 mg/dL — ABNORMAL LOW (ref 8.9–10.3)
Chloride: 103 mmol/L (ref 98–111)
Creatinine, Ser: 0.74 mg/dL (ref 0.44–1.00)
GFR, Estimated: >60 mL/min (ref >60)
Glucose, Bld: 100 mg/dL — ABNORMAL HIGH (ref 70–99)
Potassium: 3.6 mmol/L (ref 3.5–5.1)
Sodium: 140 mmol/L (ref 135–145)
Total Bilirubin: 0.6 mg/dL (ref 0.3–1.2)
Total Protein: 6.5 g/dL (ref 6.5–8.1)

## 2023-04-10 LAB — CBC WITH DIFFERENTIAL/PLATELET
Abs Immature Granulocytes: 0.02 10*3/uL (ref 0.00–0.07)
Basophils Absolute: 0 10*3/uL (ref 0.0–0.1)
Basophils Relative: 1 %
Eosinophils Absolute: 0.2 10*3/uL (ref 0.0–0.5)
Eosinophils Relative: 3 %
HCT: 37.6 % (ref 36.0–46.0)
Hemoglobin: 11.9 g/dL — ABNORMAL LOW (ref 12.0–15.0)
Immature Granulocytes: 0 %
Lymphocytes Relative: 20 %
Lymphs Abs: 1.1 10*3/uL (ref 0.7–4.0)
MCH: 29.8 pg (ref 26.0–34.0)
MCHC: 31.6 g/dL (ref 30.0–36.0)
MCV: 94 fL (ref 80.0–100.0)
Monocytes Absolute: 0.5 10*3/uL (ref 0.1–1.0)
Monocytes Relative: 9 %
Neutro Abs: 3.8 10*3/uL (ref 1.7–7.7)
Neutrophils Relative %: 67 %
Platelets: 185 10*3/uL (ref 150–400)
RBC: 4 MIL/uL (ref 3.87–5.11)
RDW: 15.9 % — ABNORMAL HIGH (ref 11.5–15.5)
WBC: 5.6 10*3/uL (ref 4.0–10.5)
nRBC: 0 % (ref 0.0–0.2)

## 2023-04-10 LAB — MAGNESIUM: Magnesium: 1.3 mg/dL — ABNORMAL LOW (ref 1.7–2.4)

## 2023-04-10 MED ORDER — SODIUM CHLORIDE 0.9 % IV SOLN
10.0000 mg | Freq: Once | INTRAVENOUS | Status: AC
  Administered 2023-04-10: 10 mg via INTRAVENOUS
  Filled 2023-04-10: qty 10

## 2023-04-10 MED ORDER — SODIUM CHLORIDE 0.9 % IV SOLN
Freq: Once | INTRAVENOUS | Status: AC
  Filled 2023-04-10: qty 250

## 2023-04-10 MED ORDER — DIPHENHYDRAMINE HCL 50 MG/ML IJ SOLN
25.0000 mg | Freq: Once | INTRAMUSCULAR | Status: AC
  Administered 2023-04-10: 25 mg via INTRAVENOUS
  Filled 2023-04-10: qty 1

## 2023-04-10 MED ORDER — SODIUM CHLORIDE 0.9 % IV SOLN
6.0000 mg/kg | Freq: Once | INTRAVENOUS | Status: AC
  Administered 2023-04-10: 400 mg via INTRAVENOUS
  Filled 2023-04-10: qty 20

## 2023-04-10 MED ORDER — MAGNESIUM SULFATE 4 GM/100ML IV SOLN
4.0000 g | Freq: Every day | INTRAVENOUS | Status: DC | PRN
  Administered 2023-04-10: 4 g via INTRAVENOUS
  Filled 2023-04-10: qty 100

## 2023-04-10 MED ORDER — SODIUM CHLORIDE 0.9 % IV SOLN
100.0000 mg/m2 | Freq: Once | INTRAVENOUS | Status: AC
  Administered 2023-04-10: 180 mg via INTRAVENOUS
  Filled 2023-04-10: qty 5

## 2023-04-10 MED ORDER — ATROPINE SULFATE 1 MG/ML IV SOLN
0.5000 mg | Freq: Once | INTRAVENOUS | Status: AC
  Administered 2023-04-10: 0.5 mg via INTRAVENOUS
  Filled 2023-04-10: qty 1

## 2023-04-10 MED ORDER — PALONOSETRON HCL INJECTION 0.25 MG/5ML
0.2500 mg | Freq: Once | INTRAVENOUS | Status: AC
  Administered 2023-04-10: 0.25 mg via INTRAVENOUS
  Filled 2023-04-10: qty 5

## 2023-04-10 MED ORDER — HEPARIN SOD (PORK) LOCK FLUSH 100 UNIT/ML IV SOLN
500.0000 [IU] | Freq: Once | INTRAVENOUS | Status: AC | PRN
  Administered 2023-04-10: 500 [IU]
  Filled 2023-04-10: qty 5

## 2023-04-10 MED ORDER — HEPARIN SOD (PORK) LOCK FLUSH 100 UNIT/ML IV SOLN
250.0000 [IU] | Freq: Once | INTRAVENOUS | Status: DC | PRN
  Filled 2023-04-10: qty 5

## 2023-04-10 NOTE — Progress Notes (Signed)
Hematology/Oncology Progress note [phone] [phone]      CHIEF COMPLAINTS/REASON FOR VISIT:  Follow up for rectal cancer  ASSESSMENT & PLAN:   Cancer Staging  Rectal cancer [facility]) Staging form: Colon and Rectum, AJCC 8th Edition - Pathologic stage from 10/06/2019: Stage IIC (ypT4b, pN0, cM0) - Signed by [name], MD on 10/06/2019 - Clinical stage from 06/30/2020: Jacqueline Yoder - Signed by [name], MD on 04/17/2022 - Pathologic: Stage Unknown (rpTX, pNX, cM1) - Signed by [name], MD on 01/31/2021   Rectal cancer [facility]) History of stage IIIC Rectal cancer, s/p TNT, followed by 09/17/19 APR/posterior vaginectomy/TAH/BSO/VY-flap, pT4b pN0 with close vaginal margin 0.2 mm.  Uterus and ovaries negative for malignancy. palliative radiation to vaginal recurrence- 01/19/21 recurrence with lung metastasis.-Palliative -FOLFIRI plus bevacizumab.  Irinotecan was dropped in November 2022 due to side effects. Negative for UGT1A1*28 - radiographically stable, rise of CEA-July 2023 CT lung metastasis worse--> Dec 2023 PET showed progression in pelvic lymph nodes and bone lesions,--> 2nd line irinotecan +panitumumab-->CT March 2024 stable. --> June 2024 CT showed mild lung progression/Progressive left iliac bone metastasis   June 2024  CT chest abdomen pelvis discussed, given that she is asymptomatic, and there is limited option left, I recommend to stay on same regimen for now. Patient and daughter agree with the plan.  Labs are reviewed and discussed with patient, CEA nadir was in 500s, currently gradually increasing.  Proceed with irinotecan +panitumumab  Encounter for antineoplastic chemotherapy Chemotherapy plan as listed above.   Anemia due to antineoplastic chemotherapy Hb stable observation  Chemotherapy induced neutropenia (HCC) patient will receive long-acting GCSF prophylaxis on D3  History of pulmonary embolism Continue  Eliquis 2.5 mg twice daily for  anticoagulation prophylaxis.  Chemotherapy induced diarrhea Stable, continue lomotil  QID PRN as instructed.   Hypomagnesemia If magnesium is 1.0 - 1.2:  administrate 6 gm IV magnesium sulfate If magnesium is 1.2-1.4, administrate 4 gm IV magnesium sulfate If magnesium is 1.5-1.6:  administrate 2 gm IV magnesium sulfate.  Continue slow mag orally 64mg  twice daily.    Follow up 2 weeks Lab MD irinotecan + panitumumab All questions were answered. The patient knows to call the clinic with any problems, questions or concerns.  [name], MD, PhD [facility] Hematology Oncology 04/10/2023      HISTORY OF PRESENTING ILLNESS:  Patient initially presented with complaints of postmenopausal bleeding on 08/16/2018.  History of was menopausal vaginal bleeding in 2016 which resulted in cervical polypectomy.  Pathology 02/04/2015 showed cervical polyp, consistent with benign endometrial polyp.  [name] after polypectomy due to anxiety associated with pelvic exams.  pelvic exam on 08/16/2018 reviewed cervical abnormality and from enlarged uterus. Seen by Dr. [name] on 10/29/2018.  Endometrial biopsy and a Pap smear was performed. 10/29/2018 Pap smear showed adenocarcinoma, favor endometrial origin. 10/29/2018 endometrial biopsy showed endometrioid carcinoma, FIGO grade 1.  10/29/2018- TA & TV Ultrasound revealed: Anteverted uterus measuring 8.7 x 5.6 x 6.4 cm without evidence of focal masses.  The endometrium measuring 24.1 mm (thickened) and heterogeneous.  Right and left ovaries not visualized.  No adnexal masses identified.  No free fluid in cul-de-sac.  Patient was seen by Dr. [name] in clinic on 11/13/2018.  Cervical exam reveals 2 cm exophytic irregular mass consistent with malignancy.   11/19/2018 CT chest abdomen pelvis with contrast showed thickened endometrium with some irregularity compatible with the provided diagnosis of endometrial malignancy.  There is a mildly prominent left  inguinal node 1.4 cm.  Patient was seen by Dr. [name] on 11/20/2018 and left groin lymph node biopsy was recommended.  11/26/2018 patient underwent left inguinal lymph node biopsy. Pathology showed metastatic adenocarcinoma consistent with colorectal origin.  CDX 2+.  Case was discussed on tumor board.  Recommend colonoscopy for further evaluation.  Patient reports significant weight loss 30 pounds over the last year.  Chronic vaginal spotting. Change of bowel habits the past few months.  More constipated.  Family history positive for brother who has colon cancer prostate cancer.  patient has underwent colonoscopy on 12/03/2018 which reviewed a nonobstructing large mass in the rectum.  Also chronic fistula.  Mass was not circumferential.  This was biopsied with a cold forceps for histology.  Pathology came back hyperplastic polyp negative for dysplasia and malignancy. [name], patient underwent flex sigmoidoscopy on 12/06/2018 with rebiopsy of the rectal mass. This time biopsy results came back positive for invasive colorectal adenocarcinoma, moderately differentiated. Immunotherapy for nearly mismatch repair protein (MMR ) was performed.  There is no loss of MMR expression.  low probability of MSI high.   # Seen by [name] for evaluation of resectability for rectal cancer. In addition, she also had a second opinion with [facility] pathology where her endometrial biopsy pathology was changed to  adenocarcinoma, consistent with colorectal primary.   [name]. She has home health that has been assisting with ostomy care  Patient was also evaluated by [facility] oncology.  Recommendation is to proceed with TNT with concurrent chemoradiation followed by neoadjuvant chemotherapy followed by surgical resection. Patient prefers to have treatment done locally with [facility].   # Oncology Treatment:  02/03/2019- 03/19/2019  concurrent Xeloda and  radiation.  Xeloda dose 825mg  /m2 BID - rounded to 1650mg  BID- on days of radiation. 04/09/2019, started on FOLFOX with bolus early.  Omitted.  07/16/2019 finished 8 cycles of FOLFOX. 09/17/19 APR/posterior vaginectomy/TAH/BSO/VY-flap pT4b pN0 with close vaginal margin 0.2 mm.  Uterus and ovaries negative for malignancy. Patient reports bilateral lower extremity numbness and tingling, intermittent, left worse than right. She has lost a lot of weight since her APR surgery.   #Family history with half brother having's history of colon cancer prostate cancer.  Personal history of colorectal cancer.  Patient has not decided if she wants genetic testing.    # history of PE( 01/13/2020)  in the bilateral lower extremity DVT (01/13/2020).   She finishes 6 months of anticoagulation with Eliquis 5 mg twice daily. Now switched to Eliquis 2.5 mg twice daily..  # She has now developed recurrent disease. #06/30/20  vaginal introitus mass biopsied. Pathology is consistent with metastatic colorectal adenocarcinoma I have discussed with [name]  Dr. Luciano Cutter and the mass is not resectable. Patient has also had colonoscopy by Dr. Tobi Bastos yesterday. Normal examination. # 07/16/2020 cycle 1 FOLFIRI  # 07/20/2020 PET scan was done for further evaluation, images are consistent with local recurrence, no distant metastasis. #Discussed with radiation oncology Dr. Rushie Chestnut will recommends concurrent chemotherapy and radiation. 08/02/2020-08/16/2020, patient starts radiation.  Xeloda was held due to neutropenia 08/17/2020,-09/06/2020 Xeloda 1500 mg twice daily concurrently with radiation  01/31/21 started on FOLFIRI + Bev 05/18/2021 CT chest abdomen pelvis showed Previously noted enlargement of bilateral inguinal lymph nodes is resolved, consistent with treatment response of nodal metastatic disease. Interval decrease in size of multiple small bilateral pulmonary nodules, consistent with treatment response of pulmonary metastatic  disease. No evidence of new metastatic  disease. 05/24/2021 - 08/30/2021, continued on FOLFIRI plus bevacizumab.  Irinotecan dose was reduced, eventually 100mg /m2  09/02/2021, CT chest abdomen pelvis without contrast Showed small bilateral pulmonary nodules, unchanged.  Stable metastatic disease.  No noncontrast evidence of new metastatic disease in the chest abdomen pelvis.  Small parastomal hernia.  Enlargement of main pulmonary artery.  Coronary artery disease.  09/13/2021, maintenance 5-FU/bevacizumab 11/28/2021, 5-FU/Irinotecan/bevacizumab.  Irinotecan 100 mg/m2 was added back due to progressively increasing CEA.    INTERVAL HISTORY Jacqueline Yoder is a 77 y.o. female who has above history reviewed by me presents for follow-up of rectal cancer. On Irinotecan and panitumumab. she tolerates well.   She was accompanied by her daughter. - chronic diarrhea is manageable. On average she empties her colostomy bag twice daily. Bowel movements are soft. Sometimes she has more loose bowel movements after eating certain food.  She take Lomotil PRN, usually 2 time per day.  -she denies any additional episode of vaginal spotting since last visit.  Review of Systems  Constitutional:  Positive for fatigue. Negative for appetite change, chills, fever and unexpected weight change.  HENT:   Negative for hearing loss and voice change.   Eyes:  Negative for eye problems.  Respiratory:  Negative for chest tightness and cough.   Cardiovascular:  Negative for chest pain.  Gastrointestinal:  Positive for diarrhea. Negative for abdominal distention, abdominal pain, blood in stool, constipation and nausea.  Endocrine: Negative for hot flashes.  Genitourinary:  Negative for difficulty urinating and frequency.   Musculoskeletal:  Positive for arthralgias.  Skin:  Negative for itching.  Neurological:  Negative for extremity weakness and numbness.  Hematological:  Negative for adenopathy.  Psychiatric/Behavioral:   Negative for confusion.     MEDICAL HISTORY:  Past Medical History:  Diagnosis Date   Allergy    Arthritis    Blood clot in vein    Family history of colon cancer    GERD (gastroesophageal reflux disease)    Hypercholesteremia    Hypertension    Hypertension    Lower extremity edema    Personal history of chemotherapy    Rectal cancer (HCC) 12/2018   Urinary incontinence     SURGICAL HISTORY: Past Surgical History:  Procedure Laterality Date   ABDOMINAL HYSTERECTOMY     CHOLECYSTECTOMY  1971   COLONOSCOPY WITH PROPOFOL N/A 12/03/2018   Procedure: COLONOSCOPY WITH PROPOFOL;  Surgeon: Midge Minium, MD;  Location: [facility] ENDOSCOPY;  Service: Endoscopy;  Laterality: N/A;   COLONOSCOPY WITH PROPOFOL N/A 07/15/2020   Procedure: COLONOSCOPY WITH PROPOFOL;  Surgeon: Wyline Mood, MD;  Location: [facility] ENDOSCOPY;  Service: Gastroenterology;  Laterality: N/A;   FLEXIBLE SIGMOIDOSCOPY N/A 12/06/2018   Procedure: FLEXIBLE SIGMOIDOSCOPY;  Surgeon: Wyline Mood, MD;  Location: [facility] ENDOSCOPY;  Service: Endoscopy;  Laterality: N/A;   LAPAROSCOPIC COLOSTOMY  01/06/2019   PORTACATH PLACEMENT N/A 04/03/2019   Procedure: INSERTION PORT-A-CATH;  Surgeon: Leafy Ro, MD;  Location: [facility] ORS;  Service: General;  Laterality: N/A;    SOCIAL HISTORY: Social History   Socioeconomic History   Marital status: Widowed    Spouse name: Not on file   Number of children: Not on file   Years of education: Not on file   Highest education level: Not on file  Occupational History   Not on file  Tobacco Use   Smoking status: Former    Types: Cigarettes    Quit date: 12/02/1977    Years since quitting: 45.3   Smokeless tobacco: Former  Vaping Use   Vaping Use: Never used  Substance and Sexual Activity   Alcohol use: Never   Drug use: Never   Sexual activity: Not Currently    Birth control/protection: None  Other Topics Concern   Not on file  Social History Narrative   Lives with daughter   Social  Determinants of Health   Financial Resource Strain: Medium Risk (06/30/2022)   Overall Financial Resource Strain (CARDIA)    Difficulty of Paying Living Expenses: Somewhat hard  Food Insecurity: No Food Insecurity (06/30/2022)   Hunger Vital Sign    Worried About Running Out of Food in the Last Year: Never true    Ran Out of Food in the Last Year: Never true  Transportation Needs: No Transportation Needs (06/30/2022)   PRAPARE - Administrator, Civil Service (Medical): No    Lack of Transportation (Non-Medical): No  Physical Activity: Inactive (06/30/2022)   Exercise Vital Sign    Days of Exercise per Week: 0 days    Minutes of Exercise per Session: 0 min  Stress: No Stress Concern Present (06/30/2022)   [employer] of Occupational Health - Occupational Stress Questionnaire    Feeling of Stress : Not at all  Social Connections: Moderately Isolated (06/30/2022)   Social Connection and Isolation Panel [NHANES]    Frequency of Communication with Friends and Family: More than three times a week    Frequency of Social Gatherings with Friends and Family: Twice a week    Attends Religious Services: More than 4 times per year    Active Member of [employer] or Organizations: No    Attends Banker Meetings: Never    Marital Status: Widowed  Intimate Partner Violence: Not At Risk (06/30/2022)   Humiliation, Afraid, Rape, and Kick questionnaire    Fear of Current or Ex-Partner: No    Emotionally Abused: No    Physically Abused: No    Sexually Abused: No    FAMILY HISTORY: Family History  Problem Relation Age of Onset   Colon cancer Brother 59       exposure to chemicals [place]   Hypertension Mother    Stroke Mother    Kidney failure Father    Breast cancer Neg Hx    Ovarian cancer Neg Hx     ALLERGIES:  is allergic to sulfamethoxazole-trimethoprim.  MEDICATIONS:  Current Outpatient Medications  Medication Sig Dispense Refill   Cholecalciferol (VITAMIN D3)  2000 units capsule Take 2,000 Units by mouth daily.     diclofenac sodium (VOLTAREN) 1 % GEL Apply 2 g topically 4 (four) times daily as needed (joint pain).  11   diphenoxylate-atropine (LOMOTIL) 2.5-0.025 MG tablet Take 1 tablet by mouth 4 (four) times daily as needed for diarrhea or loose stools. 120 tablet 0   ELIQUIS 2.5 MG TABS tablet TAKE 1 TABLET BY MOUTH TWICE  DAILY 200 tablet 2   fluticasone (FLONASE) 50 MCG/ACT nasal spray USE 1 SPRAY IN EACH NOSTRIL ONCE DAILY 16 g 3   ipratropium (ATROVENT) 0.03 % nasal spray Place 1 spray into both nostrils 2 (two) times daily.     ketoconazole (NIZORAL) 2 % cream Apply 1 Application topically daily. For up to 2 weeks. May repeat if need. 30 g 2   lidocaine-prilocaine (EMLA) cream Apply 1 application. topically as needed. 30 g 6   loperamide (IMODIUM) 2 MG capsule Take 1 capsule (2 mg total) by mouth See admin instructions. With onset of loose stool, take 4mg  followed  by 2mg  every 2 hours,  Maximum: 16 mg/day 120 capsule 1   loratadine (CLARITIN) 10 MG tablet Take 10 mg by mouth daily.     magnesium chloride (SLOW-MAG) 64 MG TBEC SR tablet Take 1 tablet (64 mg total) by mouth 2 (two) times daily. 60 tablet 2   Multiple Vitamins-Minerals (ONE-A-DAY WOMENS 50 PLUS PO) Take 1 tablet by mouth daily.      nystatin (MYCOSTATIN/NYSTOP) powder Apply 1 Application topically 3 (three) times daily. 45 g 0   potassium chloride SA (KLOR-CON M) 20 MEQ tablet TAKE 1 TABLET BY MOUTH DAILY 90 tablet 1   simvastatin (ZOCOR) 40 MG tablet Take 1 tablet (40 mg total) by mouth at bedtime. 100 tablet 3   triamterene-hydrochlorothiazide (DYAZIDE) 37.5-25 MG capsule Take 1 each (1 capsule total) by mouth daily. 100 capsule 3   zinc gluconate 50 MG tablet Take 50 mg by mouth daily.     clindamycin (CLINDAGEL) 1 % gel Apply topically 2 (two) times daily. 30 g 5   triamcinolone cream (KENALOG) 0.5 % Apply 1 Application topically 2 (two) times daily. To affected areas, for up to  2 weeks. (Patient not taking: Reported on 04/10/2023) 30 g 2   No current facility-administered medications for this visit.   Facility-Administered Medications Ordered in Other Visits  Medication Dose Route Frequency Provider Last Rate Last Admin   heparin lock flush 100 UNIT/ML injection            heparin lock flush 100 unit/mL  250 Units Intracatheter Once PRN [name], MD       magnesium sulfate IVPB 4 g 100 mL  4 g Intravenous Daily PRN [name], MD   Stopped at 04/10/23 1317     PHYSICAL EXAMINATION: ECOG PERFORMANCE STATUS: 1 - Symptomatic but completely ambulatory  Physical Exam Constitutional:      General: She is not in acute distress. HENT:     Head: Normocephalic and atraumatic.  Eyes:     General: No scleral icterus. Cardiovascular:     Rate and Rhythm: Normal rate.     Heart sounds: Normal heart sounds.  Pulmonary:     Effort: Pulmonary effort is normal. No respiratory distress.     Breath sounds: No wheezing.  Abdominal:     General: Bowel sounds are normal. There is no distension.     Palpations: Abdomen is soft.     Comments: + Colostomy bag   Musculoskeletal:        General: No deformity. Normal range of motion.     Cervical back: Normal range of motion and neck supple.  Skin:    General: Skin is warm and dry.  Neurological:     Mental Status: She is alert and oriented to person, place, and time. Mental status is at baseline.     Cranial Nerves: No cranial nerve deficit.     Coordination: Coordination normal.       LABORATORY DATA:  I have reviewed the data as listed    Latest Ref Rng & Units 04/10/2023    8:43 AM 03/27/2023    8:47 AM 03/13/2023    9:46 AM  CBC  WBC 4.0 - 10.5 K/uL 5.6  6.0  4.8   Hemoglobin 12.0 - 15.0 g/dL 16.1  09.6  04.5   Hematocrit 36.0 - 46.0 % 37.6  38.8  38.7   Platelets 150 - 400 K/uL 185  202  197       Latest Ref Rng &  Units 04/10/2023    8:43 AM 03/27/2023    8:47 AM 03/13/2023    9:46 AM  CMP  Glucose 70 - 99  mg/dL 401  027  253   BUN 8 - 23 mg/dL 14  14  13    Creatinine 0.44 - 1.00 mg/dL 6.64  4.03  4.74   Sodium 135 - 145 mmol/L 140  140  139   Potassium 3.5 - 5.1 mmol/L 3.6  3.5  3.5   Chloride 98 - 111 mmol/L 103  107  102   CO2 22 - 32 mmol/L 29  27  29    Calcium 8.9 - 10.3 mg/dL 8.8  9.0  8.7   Total Protein 6.5 - 8.1 g/dL 6.5  6.7  6.8   Total Bilirubin 0.3 - 1.2 mg/dL 0.6  0.5  0.3   Alkaline Phos 38 - 126 U/L 82  81  75   AST 15 - 41 U/L 35  35  37   ALT 0 - 44 U/L 24  26  24       RADIOGRAPHIC STUDIES: I have personally reviewed the radiological images as listed and agreed with the findings in the report. CT CHEST ABDOMEN PELVIS W CONTRAST  Result Date: 03/26/2023 CLINICAL DATA:  Follow-up metastatic rectal cancer EXAM: CT CHEST, ABDOMEN, AND PELVIS WITH CONTRAST TECHNIQUE: Multidetector CT imaging of the chest, abdomen and pelvis was performed following the standard protocol during bolus administration of intravenous contrast. RADIATION DOSE REDUCTION: This exam was performed according to the departmental dose-optimization program which includes automated exposure control, adjustment of the mA and/or kV according to patient size and/or use of iterative reconstruction technique. CONTRAST:  100 mL OMNIPAQUE IOHEXOL 300 MG/ML  SOLN COMPARISON:  CT chest abdomen pelvis dated 12/25/2022. PET-CT dated 09/19/2022. FINDINGS: CT CHEST FINDINGS Cardiovascular: The heart is normal in size. No pericardial effusion. No evidence of thoracic aortic aneurysm. Atherosclerotic calcifications of the aortic arch. Mild three-vessel coronary atherosclerosis. Right chest port terminates in the upper right atrium. Mediastinum/Nodes: No suspicious mediastinal lymphadenopathy. Visualized thyroid is unremarkable. Lungs/Pleura: Scattered pulmonary nodules/metastases, including a dominant bilobed nodule in the medial right middle lobe measuring 14 x 11 mm (series 4/image 94), previously 10 x 7 mm. Mild centrilobular and  paraseptal emphysematous changes, upper lung predominant. No focal consolidation. No pleural effusion pneumothorax. Musculoskeletal: Mild degenerative changes of the thoracic spine. Stable deformity of the left inferior scapula (series 2/image 32). No focal osseous lesions. Proximal right humerus metastasis is not well visualized on the current study. CT ABDOMEN PELVIS FINDINGS Hepatobiliary: Subcapsular low-density in the anterior/inferior right hepatic lobe (series 2/image 68), chronic, non FDG avid on prior PET-CT, benign. No suspicious hepatic lesions. Status post cholecystectomy. Mild to moderate central intrahepatic ductal dilatation, possibly mildly progressive, but present on multiple priors and favored to be postsurgical. Dilated common duct, measuring 10 mm (coronal image 31), and smoothly tapering at the ampulla. Pancreas: Within normal limits. Spleen: 12 mm cyst in the posterior spleen (series 2/image 57), benign. Adrenals/Urinary Tract: Adrenal glands are normal limits. Bilateral renal cysts, measuring up to 5.9 cm in the right upper kidney (series 2/image 56) and 5.9 cm in the left lower kidney (series 2/image 76), benign (Bosniak I). No hydronephrosis. Bladder is within normal limits. Stomach/Bowel: Stomach is within normal limits. No evidence of bowel obstruction. Normal appendix (series 2/image 84). Status post abdominoperineal resection with mid transverse colostomy. Vascular/Lymphatic: No evidence of abdominal aortic aneurysm. Atherosclerotic calcifications of the abdominal aorta and branch vessels. 6  mm short axis right external iliac node (series 2/image 105), previously 7 mm. 11 mm short axis right inguinal node (series 2/image 102), previously 10 mm. Reproductive: Status post hysterectomy with bilateral salpingo oophorectomy. Other: No abdominopelvic ascites. Postsurgical changes involving the anterior abdominal wall. Small fat containing parastomal hernia (series 2/image 81). Musculoskeletal:  Left iliac bone metastasis demonstrates a progressive soft tissue component (series 2/image 82). Degenerative changes of the visualized thoracolumbar spine. IMPRESSION: Status post abdominoperineal resection with mid transverse colostomy. Progressive pulmonary metastases, as above. Small right pelvic nodal metastases, grossly unchanged. Progressive left iliac bone metastasis, as above. Electronically Signed   By: Charline Bills M.D.   On: 03/26/2023 23:43   MM 3D SCREEN BREAST BILATERAL  Result Date: 01/22/2023 CLINICAL DATA:  Screening. EXAM: DIGITAL SCREENING BILATERAL MAMMOGRAM WITH TOMOSYNTHESIS AND CAD TECHNIQUE: Bilateral screening digital craniocaudal and mediolateral oblique mammograms were obtained. Bilateral screening digital breast tomosynthesis was performed. The images were evaluated with computer-aided detection. COMPARISON:  Previous exam(s). ACR Breast Density Category b: There are scattered areas of fibroglandular density. FINDINGS: There are no findings suspicious for malignancy. IMPRESSION: No mammographic evidence of malignancy. A result letter of this screening mammogram will be mailed directly to the patient. RECOMMENDATION: Screening mammogram in one year. (Code:SM-B-01Y) BI-RADS CATEGORY  1: Negative. Electronically Signed   By: Bary Richard M.D.   On: 01/22/2023 11:01      CT CHEST ABDOMEN PELVIS W CONTRAST  Result Date: 03/26/2023 CLINICAL DATA:  Follow-up metastatic rectal cancer EXAM: CT CHEST, ABDOMEN, AND PELVIS WITH CONTRAST TECHNIQUE: Multidetector CT imaging of the chest, abdomen and pelvis was performed following the standard protocol during bolus administration of intravenous contrast. RADIATION DOSE REDUCTION: This exam was performed according to the departmental dose-optimization program which includes automated exposure control, adjustment of the mA and/or kV according to patient size and/or use of iterative reconstruction technique. CONTRAST:  100 mL OMNIPAQUE  IOHEXOL 300 MG/ML  SOLN COMPARISON:  CT chest abdomen pelvis dated 12/25/2022. PET-CT dated 09/19/2022. FINDINGS: CT CHEST FINDINGS Cardiovascular: The heart is normal in size. No pericardial effusion. No evidence of thoracic aortic aneurysm. Atherosclerotic calcifications of the aortic arch. Mild three-vessel coronary atherosclerosis. Right chest port terminates in the upper right atrium. Mediastinum/Nodes: No suspicious mediastinal lymphadenopathy. Visualized thyroid is unremarkable. Lungs/Pleura: Scattered pulmonary nodules/metastases, including a dominant bilobed nodule in the medial right middle lobe measuring 14 x 11 mm (series 4/image 94), previously 10 x 7 mm. Mild centrilobular and paraseptal emphysematous changes, upper lung predominant. No focal consolidation. No pleural effusion pneumothorax. Musculoskeletal: Mild degenerative changes of the thoracic spine. Stable deformity of the left inferior scapula (series 2/image 32). No focal osseous lesions. Proximal right humerus metastasis is not well visualized on the current study. CT ABDOMEN PELVIS FINDINGS Hepatobiliary: Subcapsular low-density in the anterior/inferior right hepatic lobe (series 2/image 68), chronic, non FDG avid on prior PET-CT, benign. No suspicious hepatic lesions. Status post cholecystectomy. Mild to moderate central intrahepatic ductal dilatation, possibly mildly progressive, but present on multiple priors and favored to be postsurgical. Dilated common duct, measuring 10 mm (coronal image 31), and smoothly tapering at the ampulla. Pancreas: Within normal limits. Spleen: 12 mm cyst in the posterior spleen (series 2/image 57), benign. Adrenals/Urinary Tract: Adrenal glands are normal limits. Bilateral renal cysts, measuring up to 5.9 cm in the right upper kidney (series 2/image 56) and 5.9 cm in the left lower kidney (series 2/image 76), benign (Bosniak I). No hydronephrosis. Bladder is within normal limits. Stomach/Bowel: Stomach is  within normal limits. No evidence of bowel obstruction. Normal appendix (series 2/image 84). Status post abdominoperineal resection with mid transverse colostomy. Vascular/Lymphatic: No evidence of abdominal aortic aneurysm. Atherosclerotic calcifications of the abdominal aorta and branch vessels. 6 mm short axis right external iliac node (series 2/image 105), previously 7 mm. 11 mm short axis right inguinal node (series 2/image 102), previously 10 mm. Reproductive: Status post hysterectomy with bilateral salpingo oophorectomy. Other: No abdominopelvic ascites. Postsurgical changes involving the anterior abdominal wall. Small fat containing parastomal hernia (series 2/image 81). 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 Degenerative changes of the visualized thoracolumbar spine. IMPRESSION: Status post abdominoperineal resection with mid transverse colostomy. Progressive pulmonary metastases, as above. Small right pelvic nodal metastases, grossly unchanged. Progressive left iliac bone metastasis, as above. Electronically Signed   By: Charline Bills M.D.   On: 03/26/2023 23:43   MM 3D SCREEN BREAST BILATERAL  Result Date: 01/22/2023 CLINICAL DATA:  Screening. EXAM: DIGITAL SCREENING BILATERAL MAMMOGRAM WITH TOMOSYNTHESIS AND CAD TECHNIQUE: Bilateral screening digital craniocaudal and mediolateral oblique mammograms were obtained. Bilateral screening digital breast tomosynthesis was performed. The images were evaluated with computer-aided detection. COMPARISON:  Previous exam(s). ACR Breast Density Category b: There are scattered areas of fibroglandular density. FINDINGS: There are no findings suspicious for malignancy. IMPRESSION: No mammographic evidence of malignancy. A result letter of this screening mammogram will be mailed directly to the patient. RECOMMENDATION: Screening mammogram in one year. (Code:SM-B-01Y) BI-RADS CATEGORY  1:  Negative. Electronically Signed   By: Bary Richard M.D.   On: 01/22/2023 11:01

## 2023-04-10 NOTE — Assessment & Plan Note (Signed)
If magnesium is 1.0 - 1.2:  administrate 6 gm IV magnesium sulfate If magnesium is 1.2-1.4, administrate 4 gm IV magnesium sulfate If magnesium is 1.5-1.6:  administrate 2 gm IV magnesium sulfate.  Continue slow mag orally 64mg twice daily.  

## 2023-04-10 NOTE — Patient Instructions (Signed)
[facility]  Discharge Instructions: Thank you for choosing [facility] to provide your oncology and hematology care.  If you have a lab appointment with the Cancer Center, please go directly to the Cancer Center and check in at the registration area.  Wear comfortable clothing and clothing appropriate for easy access to any Portacath or PICC line.   We strive to give you quality time with your provider. You may need to reschedule your appointment if you arrive late (15 or more minutes).  Arriving late affects you and other patients whose appointments are after yours.  Also, if you miss three or more appointments without notifying the office, you may be dismissed from the clinic at the provider's discretion.      For prescription refill requests, have your pharmacy contact our office and allow 72 hours for refills to be completed.    Today you received the following chemotherapy and/or immunotherapy agents panitumumab and irinotecan      To help prevent nausea and vomiting after your treatment, we encourage you to take your nausea medication as directed.  BELOW ARE SYMPTOMS THAT SHOULD BE REPORTED IMMEDIATELY: *FEVER GREATER THAN 100.4 F (38 C) OR HIGHER *CHILLS OR SWEATING *NAUSEA AND VOMITING THAT IS NOT CONTROLLED WITH YOUR NAUSEA MEDICATION *UNUSUAL SHORTNESS OF BREATH *UNUSUAL BRUISING OR BLEEDING *URINARY PROBLEMS (pain or burning when urinating, or frequent urination) *BOWEL PROBLEMS (unusual diarrhea, constipation, pain near the anus) TENDERNESS IN MOUTH AND THROAT WITH OR WITHOUT PRESENCE OF ULCERS (sore throat, sores in mouth, or a toothache) UNUSUAL RASH, SWELLING OR PAIN  UNUSUAL VAGINAL DISCHARGE OR ITCHING   Items with * indicate a potential emergency and should be followed up as soon as possible or go to the Emergency Department if any problems should occur.  Please show the CHEMOTHERAPY ALERT CARD or IMMUNOTHERAPY ALERT  CARD at check-in to the Emergency Department and triage nurse.  Should you have questions after your visit or need to cancel or reschedule your appointment, please contact [facility]  [phone] and follow the prompts.  Office hours are 8:00 a.m. to 4:30 p.m. Monday - Friday. Please note that voicemails left after 4:00 p.m. may not be returned until the following business day.  We are closed weekends and major holidays. You have access to a nurse at all times for urgent questions. Please call the main number to the clinic [phone] and follow the prompts.  For any non-urgent questions, you may also contact your provider using MyChart. We now offer e-Visits for anyone 48 and older to request care online for non-urgent symptoms. For details visit [email or web address].   Also download the MyChart app! Go to the app store, search "MyChart", open the app, select [facility], and log in with your MyChart username and password.

## 2023-04-10 NOTE — Assessment & Plan Note (Signed)
Chemotherapy plan as listed above 

## 2023-04-10 NOTE — Assessment & Plan Note (Signed)
History of stage IIIC Rectal cancer, s/p TNT, followed by 09/17/19 APR/posterior vaginectomy/TAH/BSO/VY-flap, pT4b pN0 with close vaginal margin 0.2 mm.  Uterus and ovaries negative for malignancy. palliative radiation to vaginal recurrence- 01/19/21 recurrence with lung metastasis.-Palliative -FOLFIRI plus bevacizumab.  Irinotecan was dropped in November 2022 due to side effects. Negative for UGT1A1*28 - radiographically stable, rise of CEA-July 2023 CT lung metastasis worse--> Dec 2023 PET showed progression in pelvic lymph nodes and bone lesions,--> 2nd line irinotecan +panitumumab-->CT March 2024 stable. --> June 2024 CT showed mild lung progression/Progressive left iliac bone metastasis   June 2024  CT chest abdomen pelvis discussed, given that she is asymptomatic, and there is limited option left, I recommend to stay on same regimen for now. Patient and daughter agree with the plan.  Labs are reviewed and discussed with patient, CEA nadir was in 500s, currently gradually increasing.  Proceed with irinotecan +panitumumab

## 2023-04-10 NOTE — Assessment & Plan Note (Signed)
Stable, continue lomotil  QID PRN as instructed.  

## 2023-04-10 NOTE — Assessment & Plan Note (Signed)
patient will receive long-acting GCSF prophylaxis on D3 

## 2023-04-10 NOTE — Assessment & Plan Note (Signed)
Hb stable observation 

## 2023-04-10 NOTE — Assessment & Plan Note (Signed)
Continue  Eliquis 2.5 mg twice daily for anticoagulation prophylaxis. 

## 2023-04-11 LAB — CEA: CEA: 872 ng/mL — ABNORMAL HIGH (ref 0.0–4.7)

## 2023-04-12 ENCOUNTER — Inpatient Hospital Stay: Payer: 59

## 2023-04-12 DIAGNOSIS — E876 Hypokalemia: Secondary | ICD-10-CM | POA: Diagnosis not present

## 2023-04-12 DIAGNOSIS — C7800 Secondary malignant neoplasm of unspecified lung: Secondary | ICD-10-CM | POA: Diagnosis not present

## 2023-04-12 DIAGNOSIS — Z5111 Encounter for antineoplastic chemotherapy: Secondary | ICD-10-CM | POA: Diagnosis not present

## 2023-04-12 DIAGNOSIS — C19 Malignant neoplasm of rectosigmoid junction: Secondary | ICD-10-CM | POA: Diagnosis not present

## 2023-04-12 DIAGNOSIS — Z87891 Personal history of nicotine dependence: Secondary | ICD-10-CM | POA: Diagnosis not present

## 2023-04-12 DIAGNOSIS — C799 Secondary malignant neoplasm of unspecified site: Secondary | ICD-10-CM

## 2023-04-12 DIAGNOSIS — Z86718 Personal history of other venous thrombosis and embolism: Secondary | ICD-10-CM | POA: Diagnosis not present

## 2023-04-12 DIAGNOSIS — Z7901 Long term (current) use of anticoagulants: Secondary | ICD-10-CM | POA: Diagnosis not present

## 2023-04-12 DIAGNOSIS — R197 Diarrhea, unspecified: Secondary | ICD-10-CM | POA: Diagnosis not present

## 2023-04-12 DIAGNOSIS — Z5189 Encounter for other specified aftercare: Secondary | ICD-10-CM | POA: Diagnosis not present

## 2023-04-12 DIAGNOSIS — Z86711 Personal history of pulmonary embolism: Secondary | ICD-10-CM | POA: Diagnosis not present

## 2023-04-12 DIAGNOSIS — D6481 Anemia due to antineoplastic chemotherapy: Secondary | ICD-10-CM | POA: Diagnosis not present

## 2023-04-12 DIAGNOSIS — C20 Malignant neoplasm of rectum: Secondary | ICD-10-CM

## 2023-04-12 DIAGNOSIS — D701 Agranulocytosis secondary to cancer chemotherapy: Secondary | ICD-10-CM | POA: Diagnosis not present

## 2023-04-12 DIAGNOSIS — C7951 Secondary malignant neoplasm of bone: Secondary | ICD-10-CM | POA: Diagnosis not present

## 2023-04-12 MED ORDER — PEGFILGRASTIM INJECTION 6 MG/0.6ML ~~LOC~~
6.0000 mg | PREFILLED_SYRINGE | Freq: Once | SUBCUTANEOUS | Status: AC
  Administered 2023-04-12: 6 mg via SUBCUTANEOUS
  Filled 2023-04-12: qty 0.6

## 2023-04-23 ENCOUNTER — Encounter: Payer: Self-pay | Admitting: Radiation Oncology

## 2023-04-23 ENCOUNTER — Ambulatory Visit
Admission: RE | Admit: 2023-04-23 | Discharge: 2023-04-23 | Disposition: A | Discharge status: Home / Self Care | Payer: 59 | Source: Ambulatory Visit | Attending: Radiation Oncology | Admitting: Radiation Oncology

## 2023-04-23 VITALS — BP 117/58 | HR 72 | Temp 96.6°F | Resp 20 | Wt 160.2 lb

## 2023-04-23 DIAGNOSIS — C20 Malignant neoplasm of rectum: Secondary | ICD-10-CM

## 2023-04-23 NOTE — Progress Notes (Signed)
Radiation Oncology Follow up Note  Name: Jacqueline Yoder   Date:   04/23/2023 MRN:  [id number] DOB: 18-Oct-1945    This 77 y.o. female presents to the clinic today for follow-up status post radiation therapy to her.  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  REFERRING PROVIDER: Saralyn Pilar *  HPI: Patient is a 77 year old female previously treated for both breast cancer as well as palliative radiation therapy to her pelvis for stage IIIc adenocarcinoma with involvement of the vagina with rectal cancer..  Recent CT scan shows progressive pulmonary metastasis and small right pelvic nodal metastasis grossly unchanged.  She also had a PET CT scan back in December showing nodal disease in the right inguinal region and right pelvis.  She also has scattered pulmonary nodules indicate of metastatic disease within the chest.  She is asymptomatic specifically denies bone pain.  She is having no problems with lower urinary tract symptoms or diarrhea.  COMPLICATIONS OF TREATMENT: none  FOLLOW UP COMPLIANCE: keeps appointments   PHYSICAL EXAM:  BP (!) 117/58   Pulse 72   Temp (!) 96.6 F (35.9 C)   Resp 20   Wt 160 lb 3.2 oz (72.7 kg)   SpO2 100%   BMI 27.50 kg/m  Well-developed well-nourished patient in NAD. HEENT reveals PERLA, EOMI, discs not visualized.  Oral cavity is clear. No oral mucosal lesions are identified. Neck is clear without evidence of cervical or supraclavicular adenopathy. Lungs are clear to A&P. Cardiac examination is essentially unremarkable with regular rate and rhythm without murmur rub or thrill. Abdomen is benign with no organomegaly or masses noted. Motor sensory and DTR levels are equal and symmetric in the upper and lower extremities. Cranial nerves II through XII are grossly intact. Proprioception is intact. No peripheral adenopathy or edema is identified. No motor or sensory levels  are noted. Crude visual fields are within normal range.  RADIOLOGY RESULTS: CT scan and PET CT scan reviewed compatible with above-stated findings  PLAN: At this time I see no reason for any palliative radiation therapy.  She is asymptomatic at this time would reserve that for any bone pain any pulmonary lesions that may cause hemoptysis or atelectasis.  I will turn follow-up care over to medical oncology be happy to reevaluate the patient anytime should that be indicated.  Patient and her daughter know to call with any concerns.  I would like to take this opportunity to thank you for allowing me to participate in the care of your patient.Carmina Miller, MD

## 2023-04-24 ENCOUNTER — Inpatient Hospital Stay (HOSPITAL_BASED_OUTPATIENT_CLINIC_OR_DEPARTMENT_OTHER): Payer: 59 | Admitting: Oncology

## 2023-04-24 ENCOUNTER — Inpatient Hospital Stay: Payer: 59 | Attending: Oncology

## 2023-04-24 ENCOUNTER — Inpatient Hospital Stay: Payer: 59

## 2023-04-24 ENCOUNTER — Encounter: Payer: Self-pay | Admitting: Oncology

## 2023-04-24 VITALS — BP 123/58 | HR 82 | Temp 97.3°F | Resp 18 | Wt 159.6 lb

## 2023-04-24 DIAGNOSIS — Z5112 Encounter for antineoplastic immunotherapy: Secondary | ICD-10-CM | POA: Diagnosis not present

## 2023-04-24 DIAGNOSIS — Z5111 Encounter for antineoplastic chemotherapy: Secondary | ICD-10-CM | POA: Diagnosis not present

## 2023-04-24 DIAGNOSIS — C7951 Secondary malignant neoplasm of bone: Secondary | ICD-10-CM | POA: Diagnosis not present

## 2023-04-24 DIAGNOSIS — C20 Malignant neoplasm of rectum: Secondary | ICD-10-CM

## 2023-04-24 DIAGNOSIS — E876 Hypokalemia: Secondary | ICD-10-CM | POA: Diagnosis not present

## 2023-04-24 DIAGNOSIS — C7800 Secondary malignant neoplasm of unspecified lung: Secondary | ICD-10-CM | POA: Insufficient documentation

## 2023-04-24 DIAGNOSIS — Z7901 Long term (current) use of anticoagulants: Secondary | ICD-10-CM | POA: Diagnosis not present

## 2023-04-24 DIAGNOSIS — D6481 Anemia due to antineoplastic chemotherapy: Secondary | ICD-10-CM

## 2023-04-24 DIAGNOSIS — T451X5A Adverse effect of antineoplastic and immunosuppressive drugs, initial encounter: Secondary | ICD-10-CM | POA: Diagnosis not present

## 2023-04-24 DIAGNOSIS — C799 Secondary malignant neoplasm of unspecified site: Secondary | ICD-10-CM

## 2023-04-24 DIAGNOSIS — Z95828 Presence of other vascular implants and grafts: Secondary | ICD-10-CM

## 2023-04-24 DIAGNOSIS — N939 Abnormal uterine and vaginal bleeding, unspecified: Secondary | ICD-10-CM | POA: Insufficient documentation

## 2023-04-24 DIAGNOSIS — K521 Toxic gastroenteritis and colitis: Secondary | ICD-10-CM | POA: Diagnosis not present

## 2023-04-24 DIAGNOSIS — Z86711 Personal history of pulmonary embolism: Secondary | ICD-10-CM | POA: Insufficient documentation

## 2023-04-24 DIAGNOSIS — R197 Diarrhea, unspecified: Secondary | ICD-10-CM

## 2023-04-24 DIAGNOSIS — D701 Agranulocytosis secondary to cancer chemotherapy: Secondary | ICD-10-CM

## 2023-04-24 DIAGNOSIS — Z79899 Other long term (current) drug therapy: Secondary | ICD-10-CM | POA: Insufficient documentation

## 2023-04-24 LAB — CBC WITH DIFFERENTIAL/PLATELET
Abs Immature Granulocytes: 0.03 10*3/uL (ref 0.00–0.07)
Basophils Absolute: 0 10*3/uL (ref 0.0–0.1)
Basophils Relative: 0 %
Eosinophils Absolute: 0.2 10*3/uL (ref 0.0–0.5)
Eosinophils Relative: 3 %
HCT: 35.2 % — ABNORMAL LOW (ref 36.0–46.0)
Hemoglobin: 11.3 g/dL — ABNORMAL LOW (ref 12.0–15.0)
Immature Granulocytes: 1 %
Lymphocytes Relative: 18 %
Lymphs Abs: 1.1 10*3/uL (ref 0.7–4.0)
MCH: 30.2 pg (ref 26.0–34.0)
MCHC: 32.1 g/dL (ref 30.0–36.0)
MCV: 94.1 fL (ref 80.0–100.0)
Monocytes Absolute: 0.4 10*3/uL (ref 0.1–1.0)
Monocytes Relative: 7 %
Neutro Abs: 4.2 10*3/uL (ref 1.7–7.7)
Neutrophils Relative %: 71 %
Platelets: 210 10*3/uL (ref 150–400)
RBC: 3.74 MIL/uL — ABNORMAL LOW (ref 3.87–5.11)
RDW: 16.4 % — ABNORMAL HIGH (ref 11.5–15.5)
WBC: 5.9 10*3/uL (ref 4.0–10.5)
nRBC: 0 % (ref 0.0–0.2)

## 2023-04-24 LAB — COMPREHENSIVE METABOLIC PANEL
ALT: 55 U/L — ABNORMAL HIGH (ref 0–44)
AST: 43 U/L — ABNORMAL HIGH (ref 15–41)
Albumin: 3.6 g/dL (ref 3.5–5.0)
Alkaline Phosphatase: 104 U/L (ref 38–126)
Anion gap: 8 (ref 5–15)
BUN: 13 mg/dL (ref 8–23)
CO2: 27 mmol/L (ref 22–32)
Calcium: 8.5 mg/dL — ABNORMAL LOW (ref 8.9–10.3)
Chloride: 103 mmol/L (ref 98–111)
Creatinine, Ser: 0.74 mg/dL (ref 0.44–1.00)
GFR, Estimated: >60 mL/min (ref >60)
Glucose, Bld: 98 mg/dL (ref 70–99)
Potassium: 3.4 mmol/L — ABNORMAL LOW (ref 3.5–5.1)
Sodium: 138 mmol/L (ref 135–145)
Total Bilirubin: 0.5 mg/dL (ref 0.3–1.2)
Total Protein: 6.3 g/dL — ABNORMAL LOW (ref 6.5–8.1)

## 2023-04-24 LAB — MAGNESIUM: Magnesium: 1.2 mg/dL — ABNORMAL LOW (ref 1.7–2.4)

## 2023-04-24 MED ORDER — HEPARIN SOD (PORK) LOCK FLUSH 100 UNIT/ML IV SOLN
INTRAVENOUS | Status: AC
  Filled 2023-04-24: qty 5

## 2023-04-24 MED ORDER — MAGNESIUM SULFATE 4 GM/100ML IV SOLN
4.0000 g | Freq: Once | INTRAVENOUS | Status: AC
  Administered 2023-04-24: 4 g via INTRAVENOUS
  Filled 2023-04-24: qty 100

## 2023-04-24 MED ORDER — SODIUM CHLORIDE 0.9 % IV SOLN
INTRAVENOUS | Status: DC
  Filled 2023-04-24: qty 250

## 2023-04-24 MED ORDER — HEPARIN SOD (PORK) LOCK FLUSH 100 UNIT/ML IV SOLN
500.0000 [IU] | Freq: Once | INTRAVENOUS | Status: AC
  Administered 2023-04-24: 500 [IU] via INTRAVENOUS
  Filled 2023-04-24: qty 5

## 2023-04-24 MED ORDER — SODIUM CHLORIDE 0.9% FLUSH
10.0000 mL | Freq: Once | INTRAVENOUS | Status: AC
  Administered 2023-04-24: 10 mL via INTRAVENOUS
  Filled 2023-04-24: qty 10

## 2023-04-24 MED ORDER — LOPERAMIDE HCL 2 MG PO CAPS
4.0000 mg | ORAL_CAPSULE | Freq: Once | ORAL | Status: AC
  Administered 2023-04-24: 4 mg via ORAL

## 2023-04-24 NOTE — Assessment & Plan Note (Addendum)
Worse. Hold off treatment, continue lomotil  QID PRN as instructed.

## 2023-04-24 NOTE — Assessment & Plan Note (Signed)
Chemotherapy plan as listed above 

## 2023-04-24 NOTE — Assessment & Plan Note (Signed)
If magnesium is 1.0 - 1.2:  administrate 6 gm IV magnesium sulfate If magnesium is 1.2-1.4, administrate 4 gm IV magnesium sulfate If magnesium is 1.5-1.6:  administrate 2 gm IV magnesium sulfate.  Continue slow mag orally 64mg twice daily.  

## 2023-04-24 NOTE — Patient Instructions (Addendum)
Magnesium Sulfate Injection What is this medication? MAGNESIUM SULFATE (mag NEE zee um SUL fate) prevents and treats low levels of magnesium in your body. It may also be used to prevent and treat seizures during pregnancy in people with high blood pressure disorders, such as preeclampsia or eclampsia. Magnesium plays an important role in maintaining the health of your muscles and nervous system. This medicine may be used for other purposes; ask your health care provider or pharmacist if you have questions. What should I tell my care team before I take this medication? They need to know if you have any of these conditions: Heart disease History of irregular heart beat Kidney disease An unusual or allergic reaction to magnesium sulfate, medications, foods, dyes, or preservatives Pregnant or trying to get pregnant Breast-feeding How should I use this medication? This medication is for infusion into a vein. It is given in a hospital or clinic setting. Talk to your care team about the use of this medication in children. While this medication may be prescribed for selected conditions, precautions do apply. Overdosage: If you think you have taken too much of this medicine contact a poison control center or emergency room at once. NOTE: This medicine is only for you. Do not share this medicine with others. What if I miss a dose? This does not apply. What may interact with this medication? Certain medications for anxiety or sleep Certain medications for seizures, such phenobarbital Digoxin Medications that relax muscles for surgery Narcotic medications for pain This list may not describe all possible interactions. Give your health care provider a list of all the medicines, herbs, non-prescription drugs, or dietary supplements you use. Also tell them if you smoke, drink alcohol, or use illegal drugs. Some items may interact with your medicine. What should I watch for while using this  medication? Your condition will be monitored carefully while you are receiving this medication. You may need blood work done while you are receiving this medication. What side effects may I notice from receiving this medication? Side effects that you should report to your care team as soon as possible: Allergic reactions--skin rash, itching, hives, swelling of the face, lips, tongue, or throat High magnesium level--confusion, drowsiness, facial flushing, redness, sweating, muscle weakness, fast or irregular heartbeat, trouble breathing Low blood pressure--dizziness, feeling faint or lightheaded, blurry vision Side effects that usually do not require medical attention (report to your care team if they continue or are bothersome): Headache Nausea This list may not describe all possible side effects. Call your doctor for medical advice about side effects. You may report side effects to FDA at 1-800-FDA-1088. Where should I keep my medication? This medication is given in a hospital or clinic and will not be stored at home. NOTE: This sheet is a summary. It may not cover all possible information. If you have questions about this medicine, talk to your doctor, pharmacist, or health care provider.  2024 Elsevier/Gold Standard (2021-06-15 00:00:00)  

## 2023-04-24 NOTE — Progress Notes (Signed)
Radiation Oncology Follow up Note  Name: Jacqueline Yoder   Date:   04/23/2023 MRN:  [id number] DOB: 1946-01-06    This 77 y.o. female presents to the clinic today for follow-up of her rectal cancer.  I inadvertently included a past history of breast cancer which is not accurate and I am correcting that in my chart.Marland Kitchen

## 2023-04-24 NOTE — Assessment & Plan Note (Addendum)
potassium is stable.  Continue potassium chloride 20 mEq increase to twice daily for 3-4 days then continue on daily.

## 2023-04-24 NOTE — Progress Notes (Signed)
Pt here for follow. Pt states she had a leak in colostomy bag this morning.

## 2023-04-24 NOTE — Assessment & Plan Note (Signed)
Continue  Eliquis 2.5 mg twice daily for anticoagulation prophylaxis. 

## 2023-04-24 NOTE — Assessment & Plan Note (Signed)
patient will receive long-acting GCSF prophylaxis on D3 

## 2023-04-24 NOTE — Progress Notes (Signed)
Hematology/Oncology Progress note [phone] [phone]      CHIEF COMPLAINTS/REASON FOR VISIT:  Follow up for rectal cancer  ASSESSMENT & PLAN:   Cancer Staging  Rectal cancer [name]) Staging form: Colon and Rectum, AJCC 8th Edition - Pathologic stage from 10/06/2019: Stage IIC (ypT4b, pN0, cM0) - Signed by [name], MD on 10/06/2019 - Clinical stage from 06/30/2020: Jacqueline Yoder - Signed by [name], MD on 04/17/2022 - Pathologic: Stage Unknown (rpTX, pNX, cM1) - Signed by [name], MD on 01/31/2021   Anemia due to antineoplastic chemotherapy Hb stable observation  Chemotherapy induced neutropenia (HCC) patient will receive long-acting GCSF prophylaxis on D3  History of pulmonary embolism Continue  Eliquis 2.5 mg twice daily for anticoagulation prophylaxis.  Chemotherapy induced diarrhea Worse. Hold off treatment, continue lomotil  QID PRN as instructed.   Hypokalemia  potassium is stable.  Continue potassium chloride 20 mEq increase to twice daily for 3-4 days then continue on daily.   Hypomagnesemia If magnesium is 1.0 - 1.2:  administrate 6 gm IV magnesium sulfate If magnesium is 1.2-1.4, administrate 4 gm IV magnesium sulfate If magnesium is 1.5-1.6:  administrate 2 gm IV magnesium sulfate.  Continue slow mag orally 64mg  twice daily.   Rectal cancer (HCC) History of stage IIIC Rectal cancer, s/p TNT, followed by 09/17/19 APR/posterior vaginectomy/TAH/BSO/VY-flap, pT4b pN0 with close vaginal margin 0.2 mm.  Uterus and ovaries negative for malignancy. palliative radiation to vaginal recurrence- 01/19/21 recurrence with lung metastasis.-Palliative -FOLFIRI plus bevacizumab.  Irinotecan was dropped in November 2022 due to side effects. Negative for UGT1A1*28 - radiographically stable, rise of CEA-July 2023 CT lung metastasis worse--> Dec 2023 PET showed progression in pelvic lymph nodes and bone lesions,--> 2nd line irinotecan +panitumumab-->CT March  2024 stable. --> June 2024 CT showed mild lung progression/Progressive left iliac bone metastasis    Labs are reviewed and discussed with patient, CEA nadir was in 500s, currently gradually increasing.  hold irinotecan +panitumumab due to diarrhea.   Encounter for antineoplastic chemotherapy Chemotherapy plan as listed above.   Vaginal spotting likely recurrent rectal cancer involving vagina. Discussed with GynOnc She is asymptomatic, recommend observation.  If she develops recurrent spotting or other symptoms, recommend palliative radiation.    Follow up 2 weeks Lab MD irinotecan + panitumumab All questions were answered. The patient knows to call the clinic with any problems, questions or concerns.  [name], MD, PhD [name] 04/24/2023      HISTORY OF PRESENTING ILLNESS:  Patient initially presented with complaints of postmenopausal bleeding on 08/16/2018.  History of was menopausal vaginal bleeding in 2016 which resulted in cervical polypectomy.  Pathology 02/04/2015 showed cervical polyp, consistent with benign endometrial polyp.  [name] after polypectomy due to anxiety associated with pelvic exams.  pelvic exam on 08/16/2018 reviewed cervical abnormality and from enlarged uterus. Seen by Dr. [name] on 10/29/2018.  Endometrial biopsy and a Pap smear was performed. 10/29/2018 Pap smear showed adenocarcinoma, favor endometrial origin. 10/29/2018 endometrial biopsy showed endometrioid carcinoma, FIGO grade 1.  10/29/2018- TA & TV Ultrasound revealed: Anteverted uterus measuring 8.7 x 5.6 x 6.4 cm without evidence of focal masses.  The endometrium measuring 24.1 mm (thickened) and heterogeneous.  Right and left ovaries not visualized.  No adnexal masses identified.  No free fluid in cul-de-sac.  Patient was seen by Dr. [name] in clinic on 11/13/2018.  Cervical exam reveals 2 cm exophytic irregular mass consistent with malignancy.   11/19/2018 CT chest  abdomen pelvis with contrast showed thickened endometrium with some irregularity compatible with the provided diagnosis of endometrial malignancy.  There is a mildly prominent left inguinal node 1.4 cm.  Patient was seen by Dr. [name] on 11/20/2018 and left groin lymph node biopsy was recommended.  11/26/2018 patient underwent left inguinal lymph node biopsy. Pathology showed metastatic adenocarcinoma consistent with colorectal origin.  CDX 2+.  Case was discussed on tumor board.  Recommend colonoscopy for further evaluation.  Patient reports significant weight loss 30 pounds over the last year.  Chronic vaginal spotting. Change of bowel habits the past few months.  More constipated.  Family history positive for brother who has colon cancer prostate cancer.  patient has underwent colonoscopy on 12/03/2018 which reviewed a nonobstructing large mass in the rectum.  Also chronic fistula.  Mass was not circumferential.  This was biopsied with a cold forceps for histology.  Pathology came back hyperplastic polyp negative for dysplasia and malignancy. [name], patient underwent flex sigmoidoscopy on 12/06/2018 with rebiopsy of the rectal mass. This time biopsy results came back positive for invasive colorectal adenocarcinoma, moderately differentiated. Immunotherapy for nearly mismatch repair protein (MMR ) was performed.  There is no loss of MMR expression.  low probability of MSI high.   # Seen by [name] for evaluation of resectability for rectal cancer. In addition, she also had a second opinion with [facility] pathology where her endometrial biopsy pathology was changed to  adenocarcinoma, consistent with colorectal primary.   [name]. She has home health that has been assisting with ostomy care  Patient was also evaluated by [facility] oncology.  Recommendation is to proceed with TNT with concurrent chemoradiation followed by neoadjuvant  chemotherapy followed by surgical resection. Patient prefers to have treatment done locally with [facility].   # Oncology Treatment:  02/03/2019- 03/19/2019  concurrent Xeloda and radiation.  Xeloda dose 825mg  /m2 BID - rounded to 1650mg  BID- on days of radiation. 04/09/2019, started on FOLFOX with bolus early.  Omitted.  07/16/2019 finished 8 cycles of FOLFOX. 09/17/19 APR/posterior vaginectomy/TAH/BSO/VY-flap pT4b pN0 with close vaginal margin 0.2 mm.  Uterus and ovaries negative for malignancy. Patient reports bilateral lower extremity numbness and tingling, intermittent, left worse than right. She has lost a lot of weight since her APR surgery.   #Family history with half brother having's history of colon cancer prostate cancer.  Personal history of colorectal cancer.  Patient has not decided if she wants genetic testing.    # history of PE( 01/13/2020)  in the bilateral lower extremity DVT (01/13/2020).   She finishes 6 months of anticoagulation with Eliquis 5 mg twice daily. Now switched to Eliquis 2.5 mg twice daily..  # She has now developed recurrent disease. #06/30/20  vaginal introitus mass biopsied. Pathology is consistent with metastatic colorectal adenocarcinoma I have discussed with [name]  Dr. Luciano Cutter and the mass is not resectable. Patient has also had colonoscopy by Dr. Tobi Bastos yesterday. Normal examination. # 07/16/2020 cycle 1 FOLFIRI  # 07/20/2020 PET scan was done for further evaluation, images are consistent with local recurrence, no distant metastasis. #Discussed with radiation oncology Dr. Rushie Chestnut will recommends concurrent chemotherapy and radiation. 08/02/2020-08/16/2020, patient starts radiation.  Xeloda was held due to neutropenia 08/17/2020,-09/06/2020 Xeloda 1500 mg twice daily concurrently with radiation  01/31/21 started on FOLFIRI + Bev 05/18/2021 CT chest abdomen pelvis showed Previously noted enlargement of bilateral inguinal lymph nodes is resolved, consistent with  treatment response of nodal  metastatic disease. Interval decrease in size of multiple small bilateral pulmonary nodules, consistent with treatment response of pulmonary metastatic disease. No evidence of new metastatic disease. 05/24/2021 - 08/30/2021, continued on FOLFIRI plus bevacizumab.  Irinotecan dose was reduced, eventually 100mg /m2  09/02/2021, CT chest abdomen pelvis without contrast Showed small bilateral pulmonary nodules, unchanged.  Stable metastatic disease.  No noncontrast evidence of new metastatic disease in the chest abdomen pelvis.  Small parastomal hernia.  Enlargement of main pulmonary artery.  Coronary artery disease.  09/13/2021, maintenance 5-FU/bevacizumab 11/28/2021, 5-FU/Irinotecan/bevacizumab.  Irinotecan 100 mg/m2 was added back due to progressively increasing CEA.    INTERVAL HISTORY Jacqueline Yoder is a 77 y.o. female who has above history reviewed by me presents for follow-up of rectal cancer. On Irinotecan and panitumumab. she tolerates well.   She was accompanied by her daughter. - chronic diarrhea is worse since last visit.  She take Lomotil PRN, usually 2-3 time per day.  -she denies any additional episode of vaginal spotting since last visit.  Review of Systems  Constitutional:  Positive for fatigue. Negative for appetite change, chills, fever and unexpected weight change.  HENT:   Negative for hearing loss and voice change.   Eyes:  Negative for eye problems.  Respiratory:  Negative for chest tightness and cough.   Cardiovascular:  Negative for chest pain.  Gastrointestinal:  Positive for diarrhea. Negative for abdominal distention, abdominal pain, blood in stool, constipation and nausea.  Endocrine: Negative for hot flashes.  Genitourinary:  Negative for difficulty urinating and frequency.   Musculoskeletal:  Positive for arthralgias.  Skin:  Negative for itching.  Neurological:  Negative for extremity weakness and numbness.  Hematological:  Negative  for adenopathy.  Psychiatric/Behavioral:  Negative for confusion.     MEDICAL HISTORY:  Past Medical History:  Diagnosis Date   Allergy    Arthritis    Blood clot in vein    Family history of colon cancer    GERD (gastroesophageal reflux disease)    Hypercholesteremia    Hypertension    Hypertension    Lower extremity edema    Personal history of chemotherapy    Rectal cancer (HCC) 12/2018   Urinary incontinence     SURGICAL HISTORY: Past Surgical History:  Procedure Laterality Date   ABDOMINAL HYSTERECTOMY     CHOLECYSTECTOMY  1971   COLONOSCOPY WITH PROPOFOL N/A 12/03/2018   Procedure: COLONOSCOPY WITH PROPOFOL;  Surgeon: Midge Minium, MD;  Location: [facility] ENDOSCOPY;  Service: Endoscopy;  Laterality: N/A;   COLONOSCOPY WITH PROPOFOL N/A 07/15/2020   Procedure: COLONOSCOPY WITH PROPOFOL;  Surgeon: Wyline Mood, MD;  Location[facility] ENDOSCOPY;  Service: Gastroenterology;  Laterality: N/A;   FLEXIBLE SIGMOIDOSCOPY N/A 12/06/2018   Procedure: FLEXIBLE SIGMOIDOSCOPY;  Surgeon: Wyline Mood, MD;  Location: [facility] ENDOSCOPY;  Service: Endoscopy;  Laterality: N/A;   LAPAROSCOPIC COLOSTOMY  01/06/2019   PORTACATH PLACEMENT N/A 04/03/2019   Procedure: INSERTION PORT-A-CATH;  Surgeon: Leafy Ro, MD;  Location: [facility] ORS;  Service: General;  Laterality: N/A;    SOCIAL HISTORY: Social History   Socioeconomic History   Marital status: Widowed    Spouse name: Not on file   Number of children: Not on file   Years of education: Not on file   Highest education level: Not on file  Occupational History   Not on file  Tobacco Use   Smoking status: Former    Types: Cigarettes    Quit date: 12/02/1977    Years since quitting: 45.4  Smokeless tobacco: Former  Building services engineer Use: Never used  Substance and Sexual Activity   Alcohol use: Never   Drug use: Never   Sexual activity: Not Currently    Birth control/protection: None  Other Topics Concern   Not on file  Social History  Narrative   Lives with daughter   Social Determinants of Health   Financial Resource Strain: Medium Risk (06/30/2022)   Overall Financial Resource Strain (CARDIA)    Difficulty of Paying Living Expenses: Somewhat hard  Food Insecurity: No Food Insecurity (06/30/2022)   Hunger Vital Sign    Worried About Running Out of Food in the Last Year: Never true    Ran Out of Food in the Last Year: Never true  Transportation Needs: No Transportation Needs (06/30/2022)   PRAPARE - Administrator, Civil Service (Medical): No    Lack of Transportation (Non-Medical): No  Physical Activity: Inactive (06/30/2022)   Exercise Vital Sign    Days of Exercise per Week: 0 days    Minutes of Exercise per Session: 0 min  Stress: No Stress Concern Present (06/30/2022)   [employer] of Occupational Health - Occupational Stress Questionnaire    Feeling of Stress : Not at all  Social Connections: Moderately Isolated (06/30/2022)   Social Connection and Isolation Panel [NHANES]    Frequency of Communication with Friends and Family: More than three times a week    Frequency of Social Gatherings with Friends and Family: Twice a week    Attends Religious Services: More than 4 times per year    Active Member of [employer] or Organizations: No    Attends Banker Meetings: Never    Marital Status: Widowed  Intimate Partner Violence: Not At Risk (06/30/2022)   Humiliation, Afraid, Rape, and Kick questionnaire    Fear of Current or Ex-Partner: No    Emotionally Abused: No    Physically Abused: No    Sexually Abused: No    FAMILY HISTORY: Family History  Problem Relation Age of Onset   Colon cancer Brother 20       exposure to chemicals [place]   Hypertension Mother    Stroke Mother    Kidney failure Father    Breast cancer Neg Hx    Ovarian cancer Neg Hx     ALLERGIES:  is allergic to sulfamethoxazole-trimethoprim.  MEDICATIONS:  Current Outpatient Medications  Medication Sig  Dispense Refill   Cholecalciferol (VITAMIN D3) 2000 units capsule Take 2,000 Units by mouth daily.     clindamycin (CLINDAGEL) 1 % gel Apply topically 2 (two) times daily. 30 g 5   diclofenac sodium (VOLTAREN) 1 % GEL Apply 2 g topically 4 (four) times daily as needed (joint pain).  11   diphenoxylate-atropine (LOMOTIL) 2.5-0.025 MG tablet Take 1 tablet by mouth 4 (four) times daily as needed for diarrhea or loose stools. 120 tablet 0   ELIQUIS 2.5 MG TABS tablet TAKE 1 TABLET BY MOUTH TWICE  DAILY 200 tablet 2   fluticasone (FLONASE) 50 MCG/ACT nasal spray USE 1 SPRAY IN EACH NOSTRIL ONCE DAILY 16 g 3   ipratropium (ATROVENT) 0.03 % nasal spray Place 1 spray into both nostrils 2 (two) times daily.     ketoconazole (NIZORAL) 2 % cream Apply 1 Application topically daily. For up to 2 weeks. May repeat if need. 30 g 2   lidocaine-prilocaine (EMLA) cream Apply 1 application. topically as needed. 30 g 6   loperamide (IMODIUM) 2 MG  capsule Take 1 capsule (2 mg total) by mouth See admin instructions. With onset of loose stool, take 4mg  followed by 2mg  every 2 hours,  Maximum: 16 mg/day 120 capsule 1   loratadine (CLARITIN) 10 MG tablet Take 10 mg by mouth daily.     magnesium chloride (SLOW-MAG) 64 MG TBEC SR tablet Take 1 tablet (64 mg total) by mouth 2 (two) times daily. 60 tablet 2   Multiple Vitamins-Minerals (ONE-A-DAY WOMENS 50 PLUS PO) Take 1 tablet by mouth daily.      nystatin (MYCOSTATIN/NYSTOP) powder Apply 1 Application topically 3 (three) times daily. 45 g 0   potassium chloride SA (KLOR-CON M) 20 MEQ tablet TAKE 1 TABLET BY MOUTH DAILY 90 tablet 1   simvastatin (ZOCOR) 40 MG tablet Take 1 tablet (40 mg total) by mouth at bedtime. 100 tablet 3   triamterene-hydrochlorothiazide (DYAZIDE) 37.5-25 MG capsule Take 1 each (1 capsule total) by mouth daily. 100 capsule 3   zinc gluconate 50 MG tablet Take 50 mg by mouth daily.     triamcinolone cream (KENALOG) 0.5 % Apply 1 Application topically 2  (two) times daily. To affected areas, for up to 2 weeks. (Patient not taking: Reported on 04/24/2023) 30 g 2   No current facility-administered medications for this visit.   Facility-Administered Medications Ordered in Other Visits  Medication Dose Route Frequency Provider Last Rate Last Admin   0.9 %  sodium chloride infusion   Intravenous Continuous [name], MD 10 mL/hr at 04/24/23 1028 New Bag at 04/24/23 1028   heparin lock flush 100 UNIT/ML injection            heparin lock flush 100 unit/mL  500 Units Intravenous Once [name], MD       magnesium sulfate IVPB 4 g 100 mL  4 g Intravenous Once [name], MD 50 mL/hr at 04/24/23 1029 4 g at 04/24/23 1029     PHYSICAL EXAMINATION: ECOG PERFORMANCE STATUS: 1 - Symptomatic but completely ambulatory  Physical Exam Constitutional:      General: She is not in acute distress. HENT:     Head: Normocephalic and atraumatic.  Eyes:     General: No scleral icterus. Cardiovascular:     Rate and Rhythm: Normal rate.     Heart sounds: Normal heart sounds.  Pulmonary:     Effort: Pulmonary effort is normal. No respiratory distress.     Breath sounds: No wheezing.  Abdominal:     General: Bowel sounds are normal. There is no distension.     Palpations: Abdomen is soft.     Comments: + Colostomy bag   Musculoskeletal:        General: No deformity. Normal range of motion.     Cervical back: Normal range of motion and neck supple.  Skin:    General: Skin is warm and dry.  Neurological:     Mental Status: She is alert and oriented to person, place, and time. Mental status is at baseline.     Cranial Nerves: No cranial nerve deficit.     Coordination: Coordination normal.       LABORATORY DATA:  I have reviewed the data as listed    Latest Ref Rng & Units 04/24/2023    8:35 AM 04/10/2023    8:43 AM 03/27/2023    8:47 AM  CBC  WBC 4.0 - 10.5 K/uL 5.9  5.6  6.0   Hemoglobin 12.0 - 15.0 g/dL 09.6  04.5  40.9   Hematocrit  36.0 - 46.0 %  35.2  37.6  38.8   Platelets 150 - 400 K/uL 210  185  202       Latest Ref Rng & Units 04/24/2023    8:35 AM 04/10/2023    8:43 AM 03/27/2023    8:47 AM  CMP  Glucose 70 - 99 mg/dL 98  478  295   BUN 8 - 23 mg/dL 13  14  14    Creatinine 0.44 - 1.00 mg/dL 6.21  3.08  6.57   Sodium 135 - 145 mmol/L 138  140  140   Potassium 3.5 - 5.1 mmol/L 3.4  3.6  3.5   Chloride 98 - 111 mmol/L 103  103  107   CO2 22 - 32 mmol/L 27  29  27    Calcium 8.9 - 10.3 mg/dL 8.5  8.8  9.0   Total Protein 6.5 - 8.1 g/dL 6.3  6.5  6.7   Total Bilirubin 0.3 - 1.2 mg/dL 0.5  0.6  0.5   Alkaline Phos 38 - 126 U/L 104  82  81   AST 15 - 41 U/L 43  35  35   ALT 0 - 44 U/L 55  24  26      RADIOGRAPHIC STUDIES: I have personally reviewed the radiological images as listed and agreed with the findings in the report. CT CHEST ABDOMEN PELVIS W CONTRAST  Result Date: 03/26/2023 CLINICAL DATA:  Follow-up metastatic rectal cancer EXAM: CT CHEST, ABDOMEN, AND PELVIS WITH CONTRAST TECHNIQUE: Multidetector CT imaging of the chest, abdomen and pelvis was performed following the standard protocol during bolus administration of intravenous contrast. RADIATION DOSE REDUCTION: This exam was performed according to the departmental dose-optimization program which includes automated exposure control, adjustment of the mA and/or kV according to patient size and/or use of iterative reconstruction technique. CONTRAST:  100 mL OMNIPAQUE IOHEXOL 300 MG/ML  SOLN COMPARISON:  CT chest abdomen pelvis dated 12/25/2022. PET-CT dated 09/19/2022. FINDINGS: CT CHEST FINDINGS Cardiovascular: The heart is normal in size. No pericardial effusion. No evidence of thoracic aortic aneurysm. Atherosclerotic calcifications of the aortic arch. Mild three-vessel coronary atherosclerosis. Right chest port terminates in the upper right atrium. Mediastinum/Nodes: No suspicious mediastinal lymphadenopathy. Visualized thyroid is unremarkable. Lungs/Pleura: Scattered  pulmonary nodules/metastases, including a dominant bilobed nodule in the medial right middle lobe measuring 14 x 11 mm (series 4/image 94), previously 10 x 7 mm. Mild centrilobular and paraseptal emphysematous changes, upper lung predominant. No focal consolidation. No pleural effusion pneumothorax. Musculoskeletal: Mild degenerative changes of the thoracic spine. Stable deformity of the left inferior scapula (series 2/image 32). No focal osseous lesions. Proximal right humerus metastasis is not well visualized on the current study. CT ABDOMEN PELVIS FINDINGS Hepatobiliary: Subcapsular low-density in the anterior/inferior right hepatic lobe (series 2/image 68), chronic, non FDG avid on prior PET-CT, benign. No suspicious hepatic lesions. Status post cholecystectomy. Mild to moderate central intrahepatic ductal dilatation, possibly mildly progressive, but present on multiple priors and favored to be postsurgical. Dilated common duct, measuring 10 mm (coronal image 31), and smoothly tapering at the ampulla. Pancreas: Within normal limits. Spleen: 12 mm cyst in the posterior spleen (series 2/image 57), benign. Adrenals/Urinary Tract: Adrenal glands are normal limits. Bilateral renal cysts, measuring up to 5.9 cm in the right upper kidney (series 2/image 56) and 5.9 cm in the left lower kidney (series 2/image 76), benign (Bosniak I). No hydronephrosis. Bladder is within normal limits. Stomach/Bowel: Stomach is within normal limits. No evidence of bowel  obstruction. Normal appendix (series 2/image 84). Status post abdominoperineal resection with mid transverse colostomy. Vascular/Lymphatic: No evidence of abdominal aortic aneurysm. Atherosclerotic calcifications of the abdominal aorta and branch vessels. 6 mm short axis right external iliac node (series 2/image 105), previously 7 mm. 11 mm short axis right inguinal node (series 2/image 102), previously 10 mm. Reproductive: Status post hysterectomy with bilateral salpingo  oophorectomy. Other: No abdominopelvic ascites. Postsurgical changes involving the anterior abdominal wall. Small fat containing parastomal hernia (series 2/image 81). 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 Degenerative changes of the visualized thoracolumbar spine. IMPRESSION: Status post abdominoperineal resection with mid transverse colostomy. Progressive pulmonary metastases, as above. Small right pelvic nodal metastases, grossly unchanged. Progressive left iliac bone metastasis, as above. Electronically Signed   By: Charline Bills M.D.   On: 03/26/2023 23:43      CT CHEST ABDOMEN PELVIS W CONTRAST  Result Date: 03/26/2023 CLINICAL DATA:  Follow-up metastatic rectal cancer EXAM: CT CHEST, ABDOMEN, AND PELVIS WITH CONTRAST TECHNIQUE: Multidetector CT imaging of the chest, abdomen and pelvis was performed following the standard protocol during bolus administration of intravenous contrast. RADIATION DOSE REDUCTION: This exam was performed according to the departmental dose-optimization program which includes automated exposure control, adjustment of the mA and/or kV according to patient size and/or use of iterative reconstruction technique. CONTRAST:  100 mL OMNIPAQUE IOHEXOL 300 MG/ML  SOLN COMPARISON:  CT chest abdomen pelvis dated 12/25/2022. PET-CT dated 09/19/2022. FINDINGS: CT CHEST FINDINGS Cardiovascular: The heart is normal in size. No pericardial effusion. No evidence of thoracic aortic aneurysm. Atherosclerotic calcifications of the aortic arch. Mild three-vessel coronary atherosclerosis. Right chest port terminates in the upper right atrium. Mediastinum/Nodes: No suspicious mediastinal lymphadenopathy. Visualized thyroid is unremarkable. Lungs/Pleura: Scattered pulmonary nodules/metastases, including a dominant bilobed nodule in the medial right middle lobe measuring 14 x 11 mm (series 4/image 94), previously 10 x 7 mm.  Mild centrilobular and paraseptal emphysematous changes, upper lung predominant. No focal consolidation. No pleural effusion pneumothorax. Musculoskeletal: Mild degenerative changes of the thoracic spine. Stable deformity of the left inferior scapula (series 2/image 32). No focal osseous lesions. Proximal right humerus metastasis is not well visualized on the current study. CT ABDOMEN PELVIS FINDINGS Hepatobiliary: Subcapsular low-density in the anterior/inferior right hepatic lobe (series 2/image 68), chronic, non FDG avid on prior PET-CT, benign. No suspicious hepatic lesions. Status post cholecystectomy. Mild to moderate central intrahepatic ductal dilatation, possibly mildly progressive, but present on multiple priors and favored to be postsurgical. Dilated common duct, measuring 10 mm (coronal image 31), and smoothly tapering at the ampulla. Pancreas: Within normal limits. Spleen: 12 mm cyst in the posterior spleen (series 2/image 57), benign. Adrenals/Urinary Tract: Adrenal glands are normal limits. Bilateral renal cysts, measuring up to 5.9 cm in the right upper kidney (series 2/image 56) and 5.9 cm in the left lower kidney (series 2/image 76), benign (Bosniak I). No hydronephrosis. Bladder is within normal limits. Stomach/Bowel: Stomach is within normal limits. No evidence of bowel obstruction. Normal appendix (series 2/image 84). Status post abdominoperineal resection with mid transverse colostomy. Vascular/Lymphatic: No evidence of abdominal aortic aneurysm. Atherosclerotic calcifications of the abdominal aorta and branch vessels. 6 mm short axis right external iliac node (series 2/image 105), previously 7 mm. 11 mm short axis right inguinal node (series 2/image 102), previously 10 mm. Reproductive: Status post hysterectomy with bilateral salpingo oophorectomy. Other: No abdominopelvic ascites. Postsurgical changes involving the anterior abdominal wall. Small fat containing parastomal hernia (series  2/image 81). 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 Degenerative changes of the visualized thoracolumbar spine. IMPRESSION: Status post abdominoperineal resection with mid transverse colostomy. Progressive pulmonary metastases, as above. Small right pelvic nodal metastases, grossly unchanged. Progressive left iliac bone metastasis, as above. Electronically Signed   By: Charline Bills M.D.   On: 03/26/2023 23:43

## 2023-04-24 NOTE — Assessment & Plan Note (Signed)
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 Discussed with GynOnc She is asymptomatic, recommend observation.  If she develops recurrent spotting or other symptoms, recommend palliative radiation.  

## 2023-04-24 NOTE — Addendum Note (Signed)
Encounter addended by: Carmina Miller, MD on: 04/24/2023 1:42 PM  Actions taken: Clinical Note Signed

## 2023-04-24 NOTE — Assessment & Plan Note (Signed)
Hb stable observation 

## 2023-04-24 NOTE — Assessment & Plan Note (Signed)
History of stage IIIC Rectal cancer, s/p TNT, followed by 09/17/19 APR/posterior vaginectomy/TAH/BSO/VY-flap, pT4b pN0 with close vaginal margin 0.2 mm.  Uterus and ovaries negative for malignancy. palliative radiation to vaginal recurrence- 01/19/21 recurrence with lung metastasis.-Palliative -FOLFIRI plus bevacizumab.  Irinotecan was dropped in November 2022 due to side effects. Negative for UGT1A1*28 - radiographically stable, rise of CEA-July 2023 CT lung metastasis worse--> Dec 2023 PET showed progression in pelvic lymph nodes and bone lesions,--> 2nd line irinotecan +panitumumab-->CT March 2024 stable. --> June 2024 CT showed mild lung progression/Progressive left iliac bone metastasis    Labs are reviewed and discussed with patient, CEA nadir was in 500s, currently gradually increasing.  hold irinotecan +panitumumab due to diarrhea.

## 2023-04-25 LAB — CEA: CEA: 906 ng/mL — ABNORMAL HIGH (ref 0.0–4.7)

## 2023-04-26 ENCOUNTER — Inpatient Hospital Stay: Payer: 59

## 2023-04-27 ENCOUNTER — Ambulatory Visit: Payer: 59 | Admitting: Podiatry

## 2023-04-30 ENCOUNTER — Ambulatory Visit (INDEPENDENT_AMBULATORY_CARE_PROVIDER_SITE_OTHER): Payer: 59 | Admitting: Podiatry

## 2023-04-30 ENCOUNTER — Encounter: Payer: Self-pay | Admitting: Podiatry

## 2023-04-30 VITALS — BP 141/71 | HR 63

## 2023-04-30 DIAGNOSIS — D689 Coagulation defect, unspecified: Secondary | ICD-10-CM | POA: Diagnosis not present

## 2023-04-30 DIAGNOSIS — G620 Drug-induced polyneuropathy: Secondary | ICD-10-CM | POA: Diagnosis not present

## 2023-04-30 DIAGNOSIS — B351 Tinea unguium: Secondary | ICD-10-CM | POA: Diagnosis not present

## 2023-04-30 DIAGNOSIS — M79675 Pain in left toe(s): Secondary | ICD-10-CM

## 2023-04-30 DIAGNOSIS — M79674 Pain in right toe(s): Secondary | ICD-10-CM | POA: Diagnosis not present

## 2023-05-06 NOTE — Progress Notes (Signed)
  Subjective:  Patient ID: Jacqueline Yoder, female    DOB: [birth year],  MRN: [id number]  SANYA KOBRIN presents to clinic today for: at risk foot care with history of chemotherapy induced neuropathy and painful elongated mycotic toenails 1-5 bilaterally which are tender when wearing enclosed shoe gear. Pain is relieved with periodic professional debridement.  Chief Complaint  Patient presents with   Nail Problem    "Clip my toenails."    PCP is Smitty Cords, DO.  Allergies  Allergen Reactions   Sulfamethoxazole-Trimethoprim Other (See Comments)    Also said she had chills and pressure in nose.    Review of Systems: Negative except as noted in the HPI.  Objective: No changes noted in today's physical examination. Vitals:   04/30/23 1126  BP: (!) 141/71  Pulse: 63    YASHIKA MASK is a pleasant 77 y.o. female in NAD. AAO x 3.  Vascular Examination: Capillary refill time <3 seconds b/l LE. Palpable pedal pulses b/l LE. Digital hair present b/l. No pedal edema b/l. Skin temperature gradient WNL b/l. No varicosities b/l. Marland Kitchen  Dermatological Examination: Pedal skin with normal turgor, texture and tone b/l. No open wounds. No interdigital macerations b/l. Toenails 1-5 b/l thickened, discolored, dystrophic with subungual debris. There is pain on palpation to dorsal aspect of nailplates. There is evidence of subacute subungual hematoma of the left great toe. Nailplate remains adhered. There is no  tenderness to palpation. No hyperkeratotic nor porokeratotic lesions present on today's visit.Marland Kitchen  Neurological Examination: Protective sensation diminished with 10g monofilament b/l.  Musculoskeletal Examination: Muscle strength 5/5 to all lower extremity muscle groups bilaterally. No pain, crepitus or joint limitation noted with ROM bilateral LE.  Assessment/Plan: 1. Pain due to onychomycosis of toenails of both feet   2. Coagulation defect (HCC)   3. Chemotherapy-induced  neuropathy (HCC)     -Patient was evaluated and treated. All patient's and/or POA's questions/concerns answered on today's visit. -Patient instructed to apply Neosporin to left great toe once daily for one week. Call office if she has any problems. -Patient to continue soft, supportive shoe gear daily. -Toenails 1-5 b/l were debrided in length and girth with sterile nail nippers and dremel without iatrogenic bleeding.  -Patient/POA to call should there be question/concern in the interim.   Return in about 3 months (around 07/31/2023).  Freddie Breech, DPM

## 2023-05-07 MED FILL — Dexamethasone Sodium Phosphate Inj 100 MG/10ML: INTRAMUSCULAR | Qty: 1 | Status: AC

## 2023-05-08 ENCOUNTER — Inpatient Hospital Stay (HOSPITAL_BASED_OUTPATIENT_CLINIC_OR_DEPARTMENT_OTHER): Payer: 59 | Admitting: Oncology

## 2023-05-08 ENCOUNTER — Inpatient Hospital Stay: Payer: 59

## 2023-05-08 ENCOUNTER — Ambulatory Visit: Payer: 59

## 2023-05-08 ENCOUNTER — Other Ambulatory Visit: Payer: 59

## 2023-05-08 ENCOUNTER — Encounter: Payer: Self-pay | Admitting: Oncology

## 2023-05-08 ENCOUNTER — Ambulatory Visit: Payer: 59 | Admitting: Oncology

## 2023-05-08 VITALS — BP 115/48 | HR 73 | Temp 97.0°F | Resp 18

## 2023-05-08 VITALS — BP 115/63 | HR 73 | Temp 96.4°F | Wt 163.9 lb

## 2023-05-08 DIAGNOSIS — Z7901 Long term (current) use of anticoagulants: Secondary | ICD-10-CM | POA: Diagnosis not present

## 2023-05-08 DIAGNOSIS — C20 Malignant neoplasm of rectum: Secondary | ICD-10-CM

## 2023-05-08 DIAGNOSIS — D6481 Anemia due to antineoplastic chemotherapy: Secondary | ICD-10-CM

## 2023-05-08 DIAGNOSIS — Z5111 Encounter for antineoplastic chemotherapy: Secondary | ICD-10-CM

## 2023-05-08 DIAGNOSIS — C799 Secondary malignant neoplasm of unspecified site: Secondary | ICD-10-CM

## 2023-05-08 DIAGNOSIS — K521 Toxic gastroenteritis and colitis: Secondary | ICD-10-CM

## 2023-05-08 DIAGNOSIS — Z86711 Personal history of pulmonary embolism: Secondary | ICD-10-CM | POA: Diagnosis not present

## 2023-05-08 DIAGNOSIS — D701 Agranulocytosis secondary to cancer chemotherapy: Secondary | ICD-10-CM

## 2023-05-08 DIAGNOSIS — Z5112 Encounter for antineoplastic immunotherapy: Secondary | ICD-10-CM | POA: Diagnosis not present

## 2023-05-08 DIAGNOSIS — E876 Hypokalemia: Secondary | ICD-10-CM | POA: Diagnosis not present

## 2023-05-08 DIAGNOSIS — Z79899 Other long term (current) drug therapy: Secondary | ICD-10-CM | POA: Diagnosis not present

## 2023-05-08 DIAGNOSIS — T451X5A Adverse effect of antineoplastic and immunosuppressive drugs, initial encounter: Secondary | ICD-10-CM

## 2023-05-08 DIAGNOSIS — C7951 Secondary malignant neoplasm of bone: Secondary | ICD-10-CM | POA: Diagnosis not present

## 2023-05-08 DIAGNOSIS — C7800 Secondary malignant neoplasm of unspecified lung: Secondary | ICD-10-CM | POA: Diagnosis not present

## 2023-05-08 LAB — COMPREHENSIVE METABOLIC PANEL
ALT: 27 U/L (ref 0–44)
AST: 39 U/L (ref 15–41)
Albumin: 3.6 g/dL (ref 3.5–5.0)
Alkaline Phosphatase: 72 U/L (ref 38–126)
Anion gap: 5 (ref 5–15)
BUN: 14 mg/dL (ref 8–23)
CO2: 28 mmol/L (ref 22–32)
Calcium: 8.7 mg/dL — ABNORMAL LOW (ref 8.9–10.3)
Chloride: 105 mmol/L (ref 98–111)
Creatinine, Ser: 0.63 mg/dL (ref 0.44–1.00)
GFR, Estimated: >60 mL/min (ref >60)
Glucose, Bld: 98 mg/dL (ref 70–99)
Potassium: 3.7 mmol/L (ref 3.5–5.1)
Sodium: 138 mmol/L (ref 135–145)
Total Bilirubin: 0.6 mg/dL (ref 0.3–1.2)
Total Protein: 6.4 g/dL — ABNORMAL LOW (ref 6.5–8.1)

## 2023-05-08 LAB — CBC WITH DIFFERENTIAL/PLATELET
Abs Immature Granulocytes: 0.01 10*3/uL (ref 0.00–0.07)
Basophils Absolute: 0 10*3/uL (ref 0.0–0.1)
Basophils Relative: 1 %
Eosinophils Absolute: 0.2 10*3/uL (ref 0.0–0.5)
Eosinophils Relative: 5 %
HCT: 35.9 % — ABNORMAL LOW (ref 36.0–46.0)
Hemoglobin: 11.4 g/dL — ABNORMAL LOW (ref 12.0–15.0)
Immature Granulocytes: 0 %
Lymphocytes Relative: 30 %
Lymphs Abs: 1.3 10*3/uL (ref 0.7–4.0)
MCH: 30.2 pg (ref 26.0–34.0)
MCHC: 31.8 g/dL (ref 30.0–36.0)
MCV: 95 fL (ref 80.0–100.0)
Monocytes Absolute: 0.5 10*3/uL (ref 0.1–1.0)
Monocytes Relative: 10 %
Neutro Abs: 2.4 10*3/uL (ref 1.7–7.7)
Neutrophils Relative %: 54 %
Platelets: 203 10*3/uL (ref 150–400)
RBC: 3.78 MIL/uL — ABNORMAL LOW (ref 3.87–5.11)
RDW: 16.3 % — ABNORMAL HIGH (ref 11.5–15.5)
WBC: 4.4 10*3/uL (ref 4.0–10.5)
nRBC: 0 % (ref 0.0–0.2)

## 2023-05-08 LAB — MAGNESIUM: Magnesium: 1.4 mg/dL — ABNORMAL LOW (ref 1.7–2.4)

## 2023-05-08 MED ORDER — SODIUM CHLORIDE 0.9 % IV SOLN
10.0000 mg | Freq: Once | INTRAVENOUS | Status: AC
  Administered 2023-05-08: 10 mg via INTRAVENOUS
  Filled 2023-05-08: qty 1
  Filled 2023-05-08: qty 10

## 2023-05-08 MED ORDER — PALONOSETRON HCL INJECTION 0.25 MG/5ML
0.2500 mg | Freq: Once | INTRAVENOUS | Status: AC
  Administered 2023-05-08: 0.25 mg via INTRAVENOUS
  Filled 2023-05-08: qty 5

## 2023-05-08 MED ORDER — SODIUM CHLORIDE 0.9 % IV SOLN
100.0000 mg/m2 | Freq: Once | INTRAVENOUS | Status: AC
  Administered 2023-05-08: 180 mg via INTRAVENOUS
  Filled 2023-05-08: qty 4

## 2023-05-08 MED ORDER — HEPARIN SOD (PORK) LOCK FLUSH 100 UNIT/ML IV SOLN
500.0000 [IU] | Freq: Once | INTRAVENOUS | Status: AC | PRN
  Administered 2023-05-08: 500 [IU]
  Filled 2023-05-08: qty 5

## 2023-05-08 MED ORDER — DIPHENHYDRAMINE HCL 50 MG/ML IJ SOLN
25.0000 mg | Freq: Once | INTRAMUSCULAR | Status: AC
  Administered 2023-05-08: 25 mg via INTRAVENOUS
  Filled 2023-05-08: qty 1

## 2023-05-08 MED ORDER — SODIUM CHLORIDE 0.9 % IV SOLN
6.0000 mg/kg | Freq: Once | INTRAVENOUS | Status: AC
  Administered 2023-05-08: 400 mg via INTRAVENOUS
  Filled 2023-05-08: qty 20

## 2023-05-08 MED ORDER — ATROPINE SULFATE 1 MG/ML IV SOLN
INTRAVENOUS | Status: AC
  Filled 2023-05-08: qty 1

## 2023-05-08 MED ORDER — ATROPINE SULFATE 1 MG/ML IV SOLN
0.5000 mg | Freq: Once | INTRAVENOUS | Status: AC
  Administered 2023-05-08: 0.5 mg via INTRAVENOUS
  Filled 2023-05-08: qty 1

## 2023-05-08 MED ORDER — SODIUM CHLORIDE 0.9 % IV SOLN
Freq: Once | INTRAVENOUS | Status: AC
  Filled 2023-05-08: qty 250

## 2023-05-08 MED ORDER — MAGNESIUM SULFATE 4 GM/100ML IV SOLN
4.0000 g | Freq: Every day | INTRAVENOUS | Status: DC | PRN
  Administered 2023-05-08: 4 g via INTRAVENOUS
  Filled 2023-05-08: qty 100

## 2023-05-08 NOTE — Patient Instructions (Signed)
[facility]  Discharge Instructions: Thank you for choosing [facility] to provide your oncology and hematology care.  If you have a lab appointment with the [facility], please go directly to the [facility] and check in at the registration area.  Wear comfortable clothing and clothing appropriate for easy access to any Portacath or PICC line.   We strive to give you quality time with your provider. You may need to reschedule your appointment if you arrive late (15 or more minutes).  Arriving late affects you and other patients whose appointments are after yours.  Also, if you miss three or more appointments without notifying the office, you may be dismissed from the clinic at the provider's discretion.      For prescription refill requests, have your pharmacy contact our office and allow 72 hours for refills to be completed.    Today you received the following chemotherapy and/or immunotherapy agents Irinotecan and Vectibix    To help prevent nausea and vomiting after your treatment, we encourage you to take your nausea medication as directed.  BELOW ARE SYMPTOMS THAT SHOULD BE REPORTED IMMEDIATELY: *FEVER GREATER THAN 100.4 F (38 C) OR HIGHER *CHILLS OR SWEATING *NAUSEA AND VOMITING THAT IS NOT CONTROLLED WITH YOUR NAUSEA MEDICATION *UNUSUAL SHORTNESS OF BREATH *UNUSUAL BRUISING OR BLEEDING *URINARY PROBLEMS (pain or burning when urinating, or frequent urination) *BOWEL PROBLEMS (unusual diarrhea, constipation, pain near the anus) TENDERNESS IN MOUTH AND THROAT WITH OR WITHOUT PRESENCE OF ULCERS (sore throat, sores in mouth, or a toothache) UNUSUAL RASH, SWELLING OR PAIN  UNUSUAL VAGINAL DISCHARGE OR ITCHING   Items with * indicate a potential emergency and should be followed up as soon as possible or go to the Emergency Department if any problems should occur.  Please show the CHEMOTHERAPY ALERT CARD or IMMUNOTHERAPY ALERT CARD at  check-in to the Emergency Department and triage nurse.  Should you have questions after your visit or need to cancel or reschedule your appointment, please contact [facility]  [phone] and follow the prompts.  Office hours are 8:00 a.m. to 4:30 p.m. Monday - Friday. Please note that voicemails left after 4:00 p.m. may not be returned until the following business day.  We are closed weekends and major holidays. You have access to a nurse at all times for urgent questions. Please call the main number to the clinic [phone] and follow the prompts.  For any non-urgent questions, you may also contact your provider using MyChart. We now offer e-Visits for anyone 32 and older to request care online for non-urgent symptoms. For details visit [email or web address].   Also download the MyChart app! Go to the app store, search "MyChart", open the app, select [facility], and log in with your MyChart username and password.

## 2023-05-08 NOTE — Assessment & Plan Note (Signed)
potassium is stable.  Continue potassium chloride 20 mEq  daily.  

## 2023-05-08 NOTE — Assessment & Plan Note (Signed)
patient will receive long-acting GCSF prophylaxis on D3 

## 2023-05-08 NOTE — Assessment & Plan Note (Signed)
Chemotherapy plan as listed above 

## 2023-05-08 NOTE — Assessment & Plan Note (Signed)
Continue  Eliquis 2.5 mg twice daily for anticoagulation prophylaxis.

## 2023-05-08 NOTE — Assessment & Plan Note (Addendum)
History of stage IIIC Rectal cancer, s/p TNT, followed by 09/17/19 APR/posterior vaginectomy/TAH/BSO/VY-flap, pT4b pN0 with close vaginal margin 0.2 mm.  Uterus and ovaries negative for malignancy. palliative radiation to vaginal recurrence- 01/19/21 recurrence with lung metastasis.-Palliative -FOLFIRI plus bevacizumab.  Irinotecan was dropped in November 2022 due to side effects. Negative for UGT1A1*28 - radiographically stable, rise of CEA-July 2023 CT lung metastasis worse--> Dec 2023 PET showed progression in pelvic lymph nodes and bone lesions,--> 2nd line irinotecan +panitumumab-->CT March 2024 stable. --> June 2024 CT showed mild lung progression/Progressive left iliac bone metastasis    Labs are reviewed and discussed with patient, CEA nadir was in 900s, currently gradually increasing.  Proceed with irinotecan +panitumumab

## 2023-05-08 NOTE — Assessment & Plan Note (Addendum)
continue lomotil  QID PRN as instructed.

## 2023-05-08 NOTE — Assessment & Plan Note (Signed)
Hb stable observation 

## 2023-05-08 NOTE — Progress Notes (Signed)
Hematology/Oncology Progress note [phone] [phone]      CHIEF COMPLAINTS/REASON FOR VISIT:  Follow up for rectal cancer  ASSESSMENT & PLAN:   Cancer Staging  Rectal cancer [facility]) Staging form: Colon and Rectum, AJCC 8th Edition - Pathologic stage from 10/06/2019: Stage IIC (ypT4b, pN0, cM0) - Signed by [name], MD on 10/06/2019 - Clinical stage from 06/30/2020: [name] - Signed by [name], MD on 04/17/2022 - Pathologic: Stage Unknown (rpTX, pNX, cM1) - Signed by [name], MD on 01/31/2021   Rectal cancer [facility] History of stage IIIC Rectal cancer, s/p TNT, followed by 09/17/19 APR/posterior vaginectomy/TAH/BSO/VY-flap, pT4b pN0 with close vaginal margin 0.2 mm.  Uterus and ovaries negative for malignancy. palliative radiation to vaginal recurrence- 01/19/21 recurrence with lung metastasis.-Palliative -FOLFIRI plus bevacizumab.  Irinotecan was dropped in November 2022 due to side effects. Negative for UGT1A1*28 - radiographically stable, rise of CEA-July 2023 CT lung metastasis worse--> Dec 2023 PET showed progression in pelvic lymph nodes and bone lesions,--> 2nd line irinotecan +panitumumab-->CT March 2024 stable. --> June 2024 CT showed mild lung progression/Progressive left iliac bone metastasis    Labs are reviewed and discussed with Jacqueline Yoder, CEA nadir was in 900s, currently gradually increasing.  Proceed with irinotecan +panitumumab  Encounter for antineoplastic chemotherapy Chemotherapy plan as listed above.   Anemia due to antineoplastic chemotherapy Hb stable observation  Chemotherapy induced neutropenia (HCC) Jacqueline Yoder will receive long-acting GCSF prophylaxis on D3  History of pulmonary embolism Continue  Eliquis 2.5 mg twice daily for anticoagulation prophylaxis.  Chemotherapy induced diarrhea continue lomotil  QID PRN as instructed.   Hypomagnesemia If magnesium is 1.0 - 1.2:  administrate 6 gm IV magnesium sulfate If magnesium is  1.2-1.4, administrate 4 gm IV magnesium sulfate If magnesium is 1.5-1.6:  administrate 2 gm IV magnesium sulfate.  Continue slow mag orally 64mg  twice daily.   Hypokalemia  potassium is stable.  Continue potassium chloride 20 mEq daily.    Follow up 2 weeks Lab MD irinotecan + panitumumab All questions were answered. The Jacqueline Yoder knows to call the clinic with any problems, questions or concerns.  [name], MD, PhD [facility] Hematology Oncology 05/08/2023      HISTORY OF PRESENTING ILLNESS:  Jacqueline Yoder initially presented with complaints of postmenopausal bleeding on 08/16/2018.  History of was menopausal vaginal bleeding in 2016 which resulted in cervical polypectomy.  Pathology 02/04/2015 showed cervical polyp, consistent with benign endometrial polyp.  Jacqueline Yoder lost follow-up after polypectomy due to anxiety associated with pelvic exams.  pelvic exam on 08/16/2018 reviewed cervical abnormality and from enlarged uterus. Seen by Dr. [name] on 10/29/2018.  Endometrial biopsy and a Pap smear was performed. 10/29/2018 Pap smear showed adenocarcinoma, favor endometrial origin. 10/29/2018 endometrial biopsy showed endometrioid carcinoma, FIGO grade 1.  10/29/2018- TA & TV Ultrasound revealed: Anteverted uterus measuring 8.7 x 5.6 x 6.4 cm without evidence of focal masses.  The endometrium measuring 24.1 mm (thickened) and heterogeneous.  Right and left ovaries not visualized.  No adnexal masses identified.  No free fluid in cul-de-sac.  Jacqueline Yoder was seen by Dr. [name] in clinic on 11/13/2018.  Cervical exam reveals 2 cm exophytic irregular mass consistent with malignancy.   11/19/2018 CT chest abdomen pelvis with contrast showed thickened endometrium with some irregularity compatible with the provided diagnosis of endometrial malignancy.  There is a mildly prominent left inguinal node 1.4 cm.  Jacqueline Yoder was seen by Dr. [name] on 11/20/2018 and left groin lymph node biopsy was recommended.  11/26/2018  Jacqueline Yoder  underwent left inguinal lymph node biopsy. Pathology showed metastatic adenocarcinoma consistent with colorectal origin.  CDX 2+.  Case was discussed on tumor board.  Recommend colonoscopy for further evaluation.  Jacqueline Yoder reports significant weight loss 30 pounds over the last year.  Chronic vaginal spotting. Change of bowel habits the past few months.  More constipated.  Family history positive for brother who has colon cancer prostate cancer.  Jacqueline Yoder has underwent colonoscopy on 12/03/2018 which reviewed a nonobstructing large mass in the rectum.  Also chronic fistula.  Mass was not circumferential.  This was biopsied with a cold forceps for histology.  Pathology came back hyperplastic polyp negative for dysplasia and malignancy. [name], Jacqueline Yoder underwent flex sigmoidoscopy on 12/06/2018 with rebiopsy of the rectal mass. This time biopsy results came back positive for invasive colorectal adenocarcinoma, moderately differentiated. Immunotherapy for nearly mismatch repair protein (MMR ) was performed.  There is no loss of MMR expression.  low probability of MSI high.   # Seen by [name] for evaluation of resectability for rectal cancer. In addition, she also had a second opinion with [facility] pathology where her endometrial biopsy pathology was changed to  adenocarcinoma, consistent with colorectal primary.   Jacqueline Yoder underwent diverge colostomy. She has home health that has been assisting with ostomy care  Jacqueline Yoder was also evaluated by [facility] oncology.  Recommendation is to proceed with TNT with concurrent chemoradiation followed by neoadjuvant chemotherapy followed by surgical resection. Jacqueline Yoder prefers to have treatment done locally with [facility].   # Oncology Treatment:  02/03/2019- 03/19/2019  concurrent Xeloda and radiation.  Xeloda dose 825mg  /m2 BID - rounded to 1650mg  BID- on days of radiation. 04/09/2019, started on FOLFOX with bolus early.  Omitted.   07/16/2019 finished 8 cycles of FOLFOX. 09/17/19 APR/posterior vaginectomy/TAH/BSO/VY-flap pT4b pN0 with close vaginal margin 0.2 mm.  Uterus and ovaries negative for malignancy. Jacqueline Yoder reports bilateral lower extremity numbness and tingling, intermittent, left worse than right. She has lost a lot of weight since her APR surgery.   #Family history with half brother having's history of colon cancer prostate cancer.  Personal history of colorectal cancer.  Jacqueline Yoder has not decided if she wants genetic testing.    # history of PE( 01/13/2020)  in the bilateral lower extremity DVT (01/13/2020).   She finishes 6 months of anticoagulation with Eliquis 5 mg twice daily. Now switched to Eliquis 2.5 mg twice daily..  # She has now developed recurrent disease. #06/30/20  vaginal introitus mass biopsied. Pathology is consistent with metastatic colorectal adenocarcinoma I have discussed with [name]  Dr. Luciano Cutter and the mass is not resectable. Jacqueline Yoder has also had colonoscopy by Dr. Tobi Bastos yesterday. Normal examination. # 07/16/2020 cycle 1 FOLFIRI  # 07/20/2020 PET scan was done for further evaluation, images are consistent with local recurrence, no distant metastasis. #Discussed with radiation oncology Dr. Rushie Chestnut will recommends concurrent chemotherapy and radiation. 08/02/2020-08/16/2020, Jacqueline Yoder starts radiation.  Xeloda was held due to neutropenia 08/17/2020,-09/06/2020 Xeloda 1500 mg twice daily concurrently with radiation  01/31/21 started on FOLFIRI + Bev 05/18/2021 CT chest abdomen pelvis showed Previously noted enlargement of bilateral inguinal lymph nodes is resolved, consistent with treatment response of nodal metastatic disease. Interval decrease in size of multiple small bilateral pulmonary nodules, consistent with treatment response of pulmonary metastatic disease. No evidence of new metastatic disease. 05/24/2021 - 08/30/2021, continued on FOLFIRI plus bevacizumab.  Irinotecan dose was reduced,  eventually 100mg /m2  09/02/2021, CT chest abdomen pelvis  without contrast Showed small bilateral pulmonary nodules, unchanged.  Stable metastatic disease.  No noncontrast evidence of new metastatic disease in the chest abdomen pelvis.  Small parastomal hernia.  Enlargement of main pulmonary artery.  Coronary artery disease.  09/13/2021, maintenance 5-FU/bevacizumab 11/28/2021, 5-FU/Irinotecan/bevacizumab.  Irinotecan 100 mg/m2 was added back due to progressively increasing CEA.    INTERVAL HISTORY Jacqueline Yoder is a 77 y.o. female who has above history reviewed by me presents for follow-up of rectal cancer. On Irinotecan and panitumumab. she tolerates well.   She was accompanied by her daughter. - chronic diarrhea is better   She take Lomotil PRN, usually 2-3 time per day.  -she denies any additional episode of vaginal spotting   Review of Systems  Constitutional:  Positive for fatigue. Negative for appetite change, chills, fever and unexpected weight change.  HENT:   Negative for hearing loss and voice change.   Eyes:  Negative for eye problems.  Respiratory:  Negative for chest tightness and cough.   Cardiovascular:  Negative for chest pain.  Gastrointestinal:  Positive for diarrhea. Negative for abdominal distention, abdominal pain, blood in stool, constipation and nausea.  Endocrine: Negative for hot flashes.  Genitourinary:  Negative for difficulty urinating and frequency.   Musculoskeletal:  Positive for arthralgias.  Skin:  Negative for itching.  Neurological:  Negative for extremity weakness and numbness.  Hematological:  Negative for adenopathy.  Psychiatric/Behavioral:  Negative for confusion.     MEDICAL HISTORY:  Past Medical History:  Diagnosis Date   Allergy    Arthritis    Blood clot in vein    Family history of colon cancer    GERD (gastroesophageal reflux disease)    Hypercholesteremia    Hypertension    Hypertension    Lower extremity edema    Personal  history of chemotherapy    Rectal cancer (HCC) 12/2018   Urinary incontinence     SURGICAL HISTORY: Past Surgical History:  Procedure Laterality Date   ABDOMINAL HYSTERECTOMY     CHOLECYSTECTOMY  1971   COLONOSCOPY WITH PROPOFOL N/A 12/03/2018   Procedure: COLONOSCOPY WITH PROPOFOL;  Surgeon: Midge Minium, MD;  Location: [facility] ENDOSCOPY;  Service: Endoscopy;  Laterality: N/A;   COLONOSCOPY WITH PROPOFOL N/A 07/15/2020   Procedure: COLONOSCOPY WITH PROPOFOL;  Surgeon: Wyline Mood, MD;  Location: [facility] ENDOSCOPY;  Service: Gastroenterology;  Laterality: N/A;   FLEXIBLE SIGMOIDOSCOPY N/A 12/06/2018   Procedure: FLEXIBLE SIGMOIDOSCOPY;  Surgeon: Wyline Mood, MD;  Location: [facility] ENDOSCOPY;  Service: Endoscopy;  Laterality: N/A;   LAPAROSCOPIC COLOSTOMY  01/06/2019   PORTACATH PLACEMENT N/A 04/03/2019   Procedure: INSERTION PORT-A-CATH;  Surgeon: Leafy Ro, MD;  Location: [facility] ORS;  Service: General;  Laterality: N/A;    SOCIAL HISTORY: Social History   Socioeconomic History   Marital status: Widowed    Spouse name: Not on file   Number of children: Not on file   Years of education: Not on file   Highest education level: Not on file  Occupational History   Not on file  Tobacco Use   Smoking status: Former    Current packs/day: 0.00    Types: Cigarettes    Quit date: 12/02/1977    Years since quitting: 45.4   Smokeless tobacco: Former  Building services engineer status: Never Used  Substance and Sexual Activity   Alcohol use: Never   Drug use: Never   Sexual activity: Not Currently    Birth control/protection: None  Other Topics Concern  Not on file  Social History Narrative   Lives with daughter   Social Determinants of Health   Financial Resource Strain: Medium Risk (06/30/2022)   Overall Financial Resource Strain (CARDIA)    Difficulty of Paying Living Expenses: Somewhat hard  Food Insecurity: No Food Insecurity (06/30/2022)   Hunger Vital Sign    Worried About Running Out  of Food in the Last Year: Never true    Ran Out of Food in the Last Year: Never true  Transportation Needs: No Transportation Needs (06/30/2022)   PRAPARE - Administrator, Civil Service (Medical): No    Lack of Transportation (Non-Medical): No  Physical Activity: Inactive (06/30/2022)   Exercise Vital Sign    Days of Exercise per Week: 0 days    Minutes of Exercise per Session: 0 min  Stress: No Stress Concern Present (06/30/2022)   [employer] of Occupational Health - Occupational Stress Questionnaire    Feeling of Stress : Not at all  Social Connections: Moderately Isolated (06/30/2022)   Social Connection and Isolation Panel [NHANES]    Frequency of Communication with Friends and Family: More than three times a week    Frequency of Social Gatherings with Friends and Family: Twice a week    Attends Religious Services: More than 4 times per year    Active Member of [employer] or Organizations: No    Attends Banker Meetings: Never    Marital Status: Widowed  Intimate Partner Violence: Not At Risk (06/30/2022)   Humiliation, Afraid, Rape, and Kick questionnaire    Fear of Current or Ex-Partner: No    Emotionally Abused: No    Physically Abused: No    Sexually Abused: No    FAMILY HISTORY: Family History  Problem Relation Age of Onset   Colon cancer Brother 74       exposure to chemicals [place]   Hypertension Mother    Stroke Mother    Kidney failure Father    Breast cancer Neg Hx    Ovarian cancer Neg Hx     ALLERGIES:  is allergic to sulfamethoxazole-trimethoprim.  MEDICATIONS:  Current Outpatient Medications  Medication Sig Dispense Refill   Cholecalciferol (VITAMIN D3) 2000 units capsule Take 2,000 Units by mouth daily.     clindamycin (CLINDAGEL) 1 % gel Apply topically 2 (two) times daily. 30 g 5   diclofenac sodium (VOLTAREN) 1 % GEL Apply 2 g topically 4 (four) times daily as needed (joint pain).  11   diphenoxylate-atropine (LOMOTIL)  2.5-0.025 MG tablet Take 1 tablet by mouth 4 (four) times daily as needed for diarrhea or loose stools. 120 tablet 0   ELIQUIS 2.5 MG TABS tablet TAKE 1 TABLET BY MOUTH TWICE  DAILY 200 tablet 2   fluticasone (FLONASE) 50 MCG/ACT nasal spray USE 1 SPRAY IN EACH NOSTRIL ONCE DAILY 16 g 3   ipratropium (ATROVENT) 0.03 % nasal spray Place 1 spray into both nostrils 2 (two) times daily.     ketoconazole (NIZORAL) 2 % cream Apply 1 Application topically daily. For up to 2 weeks. May repeat if need. 30 g 2   lidocaine-prilocaine (EMLA) cream Apply 1 application. topically as needed. 30 g 6   loperamide (IMODIUM) 2 MG capsule Take 1 capsule (2 mg total) by mouth See admin instructions. With onset of loose stool, take 4mg  followed by 2mg  every 2 hours,  Maximum: 16 mg/day 120 capsule 1   loratadine (CLARITIN) 10 MG tablet Take 10 mg by mouth daily.  magnesium chloride (SLOW-MAG) 64 MG TBEC SR tablet Take 1 tablet (64 mg total) by mouth 2 (two) times daily. 60 tablet 2   Multiple Vitamins-Minerals (ONE-A-DAY WOMENS 50 PLUS PO) Take 1 tablet by mouth daily.      nystatin (MYCOSTATIN/NYSTOP) powder Apply 1 Application topically 3 (three) times daily. 45 g 0   potassium chloride SA (KLOR-CON M) 20 MEQ tablet TAKE 1 TABLET BY MOUTH DAILY 90 tablet 1   simvastatin (ZOCOR) 40 MG tablet Take 1 tablet (40 mg total) by mouth at bedtime. 100 tablet 3   triamterene-hydrochlorothiazide (DYAZIDE) 37.5-25 MG capsule Take 1 each (1 capsule total) by mouth daily. 100 capsule 3   zinc gluconate 50 MG tablet Take 50 mg by mouth daily.     triamcinolone cream (KENALOG) 0.5 % Apply 1 Application topically 2 (two) times daily. To affected areas, for up to 2 weeks. (Jacqueline Yoder not taking: Reported on 05/08/2023) 30 g 2   No current facility-administered medications for this visit.   Facility-Administered Medications Ordered in Other Visits  Medication Dose Route Frequency Provider Last Rate Last Admin   atropine injection 0.5  mg  0.5 mg Intravenous Once [name], MD       heparin lock flush 100 UNIT/ML injection            heparin lock flush 100 unit/mL  500 Units Intracatheter Once PRN [name], MD       irinotecan (CAMPTOSAR) 180 mg in sodium chloride 0.9 % 500 mL chemo infusion  100 mg/m2 (Treatment Plan Recorded) Intravenous Once [name], MD       magnesium sulfate IVPB 4 g 100 mL  4 g Intravenous Daily PRN [name], MD       panitumumab (VECTIBIX) 400 mg in sodium chloride 0.9 % 100 mL chemo infusion  6 mg/kg (Order-Specific) Intravenous Once [name], MD 240 mL/hr at 05/08/23 0945 400 mg at 05/08/23 0945     PHYSICAL EXAMINATION: ECOG PERFORMANCE STATUS: 1 - Symptomatic but completely ambulatory  Physical Exam Constitutional:      General: She is not in acute distress. HENT:     Head: Normocephalic and atraumatic.  Eyes:     General: No scleral icterus. Cardiovascular:     Rate and Rhythm: Normal rate.     Heart sounds: Normal heart sounds.  Pulmonary:     Effort: Pulmonary effort is normal. No respiratory distress.     Breath sounds: No wheezing.  Abdominal:     General: Bowel sounds are normal. There is no distension.     Palpations: Abdomen is soft.     Comments: + Colostomy bag   Musculoskeletal:        General: No deformity. Normal range of motion.     Cervical back: Normal range of motion and neck supple.  Skin:    General: Skin is warm and dry.  Neurological:     Mental Status: She is alert and oriented to person, place, and time. Mental status is at baseline.     Cranial Nerves: No cranial nerve deficit.     Coordination: Coordination normal.       LABORATORY DATA:  I have reviewed the data as listed    Latest Ref Rng & Units 05/08/2023    8:15 AM 04/24/2023    8:35 AM 04/10/2023    8:43 AM  CBC  WBC 4.0 - 10.5 K/uL 4.4  5.9  5.6   Hemoglobin 12.0 - 15.0 g/dL 40.9  11.3  11.9   Hematocrit 36.0 - 46.0 % 35.9  35.2  37.6   Platelets 150 - 400 K/uL 203  210  185        Latest Ref Rng & Units 05/08/2023    8:15 AM 04/24/2023    8:35 AM 04/10/2023    8:43 AM  CMP  Glucose 70 - 99 mg/dL 98  98  865   BUN 8 - 23 mg/dL 14  13  14    Creatinine 0.44 - 1.00 mg/dL 7.84  6.96  2.95   Sodium 135 - 145 mmol/L 138  138  140   Potassium 3.5 - 5.1 mmol/L 3.7  3.4  3.6   Chloride 98 - 111 mmol/L 105  103  103   CO2 22 - 32 mmol/L 28  27  29    Calcium 8.9 - 10.3 mg/dL 8.7  8.5  8.8   Total Protein 6.5 - 8.1 g/dL 6.4  6.3  6.5   Total Bilirubin 0.3 - 1.2 mg/dL 0.6  0.5  0.6   Alkaline Phos 38 - 126 U/L 72  104  82   AST 15 - 41 U/L 39  43  35   ALT 0 - 44 U/L 27  55  24      RADIOGRAPHIC STUDIES: I have personally reviewed the radiological images as listed and agreed with the findings in the report. CT CHEST ABDOMEN PELVIS W CONTRAST  Result Date: 03/26/2023 CLINICAL DATA:  Follow-up metastatic rectal cancer EXAM: CT CHEST, ABDOMEN, AND PELVIS WITH CONTRAST TECHNIQUE: Multidetector CT imaging of the chest, abdomen and pelvis was performed following the standard protocol during bolus administration of intravenous contrast. RADIATION DOSE REDUCTION: This exam was performed according to the departmental dose-optimization program which includes automated exposure control, adjustment of the mA and/or kV according to Jacqueline Yoder size and/or use of iterative reconstruction technique. CONTRAST:  100 mL OMNIPAQUE IOHEXOL 300 MG/ML  SOLN COMPARISON:  CT chest abdomen pelvis dated 12/25/2022. PET-CT dated 09/19/2022. FINDINGS: CT CHEST FINDINGS Cardiovascular: The heart is normal in size. No pericardial effusion. No evidence of thoracic aortic aneurysm. Atherosclerotic calcifications of the aortic arch. Mild three-vessel coronary atherosclerosis. Right chest port terminates in the upper right atrium. Mediastinum/Nodes: No suspicious mediastinal lymphadenopathy. Visualized thyroid is unremarkable. Lungs/Pleura: Scattered pulmonary nodules/metastases, including a dominant bilobed nodule in the  medial right middle lobe measuring 14 x 11 mm (series 4/image 94), previously 10 x 7 mm. Mild centrilobular and paraseptal emphysematous changes, upper lung predominant. No focal consolidation. No pleural effusion pneumothorax. Musculoskeletal: Mild degenerative changes of the thoracic spine. Stable deformity of the left inferior scapula (series 2/image 32). No focal osseous lesions. Proximal right humerus metastasis is not well visualized on the current study. CT ABDOMEN PELVIS FINDINGS Hepatobiliary: Subcapsular low-density in the anterior/inferior right hepatic lobe (series 2/image 68), chronic, non FDG avid on prior PET-CT, benign. No suspicious hepatic lesions. Status post cholecystectomy. Mild to moderate central intrahepatic ductal dilatation, possibly mildly progressive, but present on multiple priors and favored to be postsurgical. Dilated common duct, measuring 10 mm (coronal image 31), and smoothly tapering at the ampulla. Pancreas: Within normal limits. Spleen: 12 mm cyst in the posterior spleen (series 2/image 57), benign. Adrenals/Urinary Tract: Adrenal glands are normal limits. Bilateral renal cysts, measuring up to 5.9 cm in the right upper kidney (series 2/image 56) and 5.9 cm in the left lower kidney (series 2/image 76), benign (Bosniak I). No hydronephrosis. Bladder is within normal limits. Stomach/Bowel: Stomach is within normal limits.  No evidence of bowel obstruction. Normal appendix (series 2/image 84). Status post abdominoperineal resection with mid transverse colostomy. Vascular/Lymphatic: No evidence of abdominal aortic aneurysm. Atherosclerotic calcifications of the abdominal aorta and branch vessels. 6 mm short axis right external iliac node (series 2/image 105), previously 7 mm. 11 mm short axis right inguinal node (series 2/image 102), previously 10 mm. Reproductive: Status post hysterectomy with bilateral salpingo oophorectomy. Other: No abdominopelvic ascites. Postsurgical changes  involving the anterior abdominal wall. Small fat containing parastomal hernia (series 2/image 81). 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 Degenerative changes of the visualized thoracolumbar spine. IMPRESSION: Status post abdominoperineal resection with mid transverse colostomy. Progressive pulmonary metastases, as above. Small right pelvic nodal metastases, grossly unchanged. Progressive left iliac bone metastasis, as above. Electronically Signed   By: Charline Bills M.D.   On: 03/26/2023 23:43      CT CHEST ABDOMEN PELVIS W CONTRAST  Result Date: 03/26/2023 CLINICAL DATA:  Follow-up metastatic rectal cancer EXAM: CT CHEST, ABDOMEN, AND PELVIS WITH CONTRAST TECHNIQUE: Multidetector CT imaging of the chest, abdomen and pelvis was performed following the standard protocol during bolus administration of intravenous contrast. RADIATION DOSE REDUCTION: This exam was performed according to the departmental dose-optimization program which includes automated exposure control, adjustment of the mA and/or kV according to Jacqueline Yoder size and/or use of iterative reconstruction technique. CONTRAST:  100 mL OMNIPAQUE IOHEXOL 300 MG/ML  SOLN COMPARISON:  CT chest abdomen pelvis dated 12/25/2022. PET-CT dated 09/19/2022. FINDINGS: CT CHEST FINDINGS Cardiovascular: The heart is normal in size. No pericardial effusion. No evidence of thoracic aortic aneurysm. Atherosclerotic calcifications of the aortic arch. Mild three-vessel coronary atherosclerosis. Right chest port terminates in the upper right atrium. Mediastinum/Nodes: No suspicious mediastinal lymphadenopathy. Visualized thyroid is unremarkable. Lungs/Pleura: Scattered pulmonary nodules/metastases, including a dominant bilobed nodule in the medial right middle lobe measuring 14 x 11 mm (series 4/image 94), previously 10 x 7 mm. Mild centrilobular and paraseptal emphysematous changes, upper lung  predominant. No focal consolidation. No pleural effusion pneumothorax. Musculoskeletal: Mild degenerative changes of the thoracic spine. Stable deformity of the left inferior scapula (series 2/image 32). No focal osseous lesions. Proximal right humerus metastasis is not well visualized on the current study. CT ABDOMEN PELVIS FINDINGS Hepatobiliary: Subcapsular low-density in the anterior/inferior right hepatic lobe (series 2/image 68), chronic, non FDG avid on prior PET-CT, benign. No suspicious hepatic lesions. Status post cholecystectomy. Mild to moderate central intrahepatic ductal dilatation, possibly mildly progressive, but present on multiple priors and favored to be postsurgical. Dilated common duct, measuring 10 mm (coronal image 31), and smoothly tapering at the ampulla. Pancreas: Within normal limits. Spleen: 12 mm cyst in the posterior spleen (series 2/image 57), benign. Adrenals/Urinary Tract: Adrenal glands are normal limits. Bilateral renal cysts, measuring up to 5.9 cm in the right upper kidney (series 2/image 56) and 5.9 cm in the left lower kidney (series 2/image 76), benign (Bosniak I). No hydronephrosis. Bladder is within normal limits. Stomach/Bowel: Stomach is within normal limits. No evidence of bowel obstruction. Normal appendix (series 2/image 84). Status post abdominoperineal resection with mid transverse colostomy. Vascular/Lymphatic: No evidence of abdominal aortic aneurysm. Atherosclerotic calcifications of the abdominal aorta and branch vessels. 6 mm short axis right external iliac node (series 2/image 105), previously 7 mm. 11 mm short axis right inguinal node (series 2/image 102), previously 10 mm. Reproductive: Status post hysterectomy with bilateral salpingo oophorectomy. Other: No abdominopelvic ascites. Postsurgical changes involving the anterior abdominal wall. Small fat  containing parastomal hernia (series 2/image 81). 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 Degenerative changes of the visualized thoracolumbar spine. IMPRESSION: Status post abdominoperineal resection with mid transverse colostomy. Progressive pulmonary metastases, as above. Small right pelvic nodal metastases, grossly unchanged. Progressive left iliac bone metastasis, as above. Electronically Signed   By: Charline Bills M.D.   On: 03/26/2023 23:43

## 2023-05-08 NOTE — Assessment & Plan Note (Signed)
If magnesium is 1.0 - 1.2:  administrate 6 gm IV magnesium sulfate If magnesium is 1.2-1.4, administrate 4 gm IV magnesium sulfate If magnesium is 1.5-1.6:  administrate 2 gm IV magnesium sulfate.  Continue slow mag orally 64mg twice daily.  

## 2023-05-09 DIAGNOSIS — Z933 Colostomy status: Secondary | ICD-10-CM | POA: Diagnosis not present

## 2023-05-09 LAB — CEA: CEA: 1316 ng/mL — ABNORMAL HIGH (ref 0.0–4.7)

## 2023-05-10 ENCOUNTER — Ambulatory Visit: Payer: 59

## 2023-05-10 ENCOUNTER — Inpatient Hospital Stay: Payer: 59

## 2023-05-10 DIAGNOSIS — C20 Malignant neoplasm of rectum: Secondary | ICD-10-CM

## 2023-05-10 DIAGNOSIS — C7951 Secondary malignant neoplasm of bone: Secondary | ICD-10-CM | POA: Diagnosis not present

## 2023-05-10 DIAGNOSIS — T451X5A Adverse effect of antineoplastic and immunosuppressive drugs, initial encounter: Secondary | ICD-10-CM | POA: Diagnosis not present

## 2023-05-10 DIAGNOSIS — Z5112 Encounter for antineoplastic immunotherapy: Secondary | ICD-10-CM | POA: Diagnosis not present

## 2023-05-10 DIAGNOSIS — Z5111 Encounter for antineoplastic chemotherapy: Secondary | ICD-10-CM | POA: Diagnosis not present

## 2023-05-10 DIAGNOSIS — Z79899 Other long term (current) drug therapy: Secondary | ICD-10-CM | POA: Diagnosis not present

## 2023-05-10 DIAGNOSIS — K521 Toxic gastroenteritis and colitis: Secondary | ICD-10-CM | POA: Diagnosis not present

## 2023-05-10 DIAGNOSIS — C799 Secondary malignant neoplasm of unspecified site: Secondary | ICD-10-CM

## 2023-05-10 DIAGNOSIS — Z7901 Long term (current) use of anticoagulants: Secondary | ICD-10-CM | POA: Diagnosis not present

## 2023-05-10 DIAGNOSIS — E876 Hypokalemia: Secondary | ICD-10-CM | POA: Diagnosis not present

## 2023-05-10 DIAGNOSIS — Z86711 Personal history of pulmonary embolism: Secondary | ICD-10-CM | POA: Diagnosis not present

## 2023-05-10 DIAGNOSIS — D6481 Anemia due to antineoplastic chemotherapy: Secondary | ICD-10-CM | POA: Diagnosis not present

## 2023-05-10 DIAGNOSIS — C7800 Secondary malignant neoplasm of unspecified lung: Secondary | ICD-10-CM | POA: Diagnosis not present

## 2023-05-10 MED ORDER — PEGFILGRASTIM INJECTION 6 MG/0.6ML ~~LOC~~
6.0000 mg | PREFILLED_SYRINGE | Freq: Once | SUBCUTANEOUS | Status: AC
  Administered 2023-05-10: 6 mg via SUBCUTANEOUS
  Filled 2023-05-10: qty 0.6

## 2023-05-14 ENCOUNTER — Other Ambulatory Visit: Payer: Self-pay | Admitting: Oncology

## 2023-05-16 ENCOUNTER — Encounter: Payer: Self-pay | Admitting: Oncology

## 2023-05-16 DIAGNOSIS — M7989 Other specified soft tissue disorders: Secondary | ICD-10-CM

## 2023-05-17 NOTE — Telephone Encounter (Signed)
Pleas schedule pt for STAT RLE [place] and inform pt of appt.

## 2023-05-18 ENCOUNTER — Ambulatory Visit
Admission: RE | Admit: 2023-05-18 | Discharge: 2023-05-18 | Disposition: A | Discharge status: Home / Self Care | Payer: 59 | Source: Ambulatory Visit | Attending: Oncology | Admitting: Oncology

## 2023-05-18 DIAGNOSIS — M7989 Other specified soft tissue disorders: Secondary | ICD-10-CM | POA: Insufficient documentation

## 2023-05-21 MED FILL — Dexamethasone Sodium Phosphate Inj 100 MG/10ML: INTRAMUSCULAR | Qty: 1 | Status: AC

## 2023-05-22 ENCOUNTER — Other Ambulatory Visit: Payer: 59

## 2023-05-22 ENCOUNTER — Encounter: Payer: Self-pay | Admitting: Oncology

## 2023-05-22 ENCOUNTER — Ambulatory Visit: Payer: 59

## 2023-05-22 ENCOUNTER — Inpatient Hospital Stay (HOSPITAL_BASED_OUTPATIENT_CLINIC_OR_DEPARTMENT_OTHER): Payer: 59 | Admitting: Oncology

## 2023-05-22 ENCOUNTER — Inpatient Hospital Stay: Payer: 59 | Attending: Oncology

## 2023-05-22 ENCOUNTER — Ambulatory Visit: Payer: 59 | Admitting: Oncology

## 2023-05-22 ENCOUNTER — Inpatient Hospital Stay: Payer: 59

## 2023-05-22 VITALS — BP 129/51 | HR 67 | Temp 97.8°F | Resp 18 | Wt 162.5 lb

## 2023-05-22 DIAGNOSIS — Z86711 Personal history of pulmonary embolism: Secondary | ICD-10-CM | POA: Insufficient documentation

## 2023-05-22 DIAGNOSIS — Z79899 Other long term (current) drug therapy: Secondary | ICD-10-CM | POA: Diagnosis not present

## 2023-05-22 DIAGNOSIS — C7800 Secondary malignant neoplasm of unspecified lung: Secondary | ICD-10-CM | POA: Insufficient documentation

## 2023-05-22 DIAGNOSIS — C20 Malignant neoplasm of rectum: Secondary | ICD-10-CM

## 2023-05-22 DIAGNOSIS — E876 Hypokalemia: Secondary | ICD-10-CM | POA: Diagnosis not present

## 2023-05-22 DIAGNOSIS — R634 Abnormal weight loss: Secondary | ICD-10-CM | POA: Diagnosis not present

## 2023-05-22 DIAGNOSIS — R197 Diarrhea, unspecified: Secondary | ICD-10-CM | POA: Diagnosis not present

## 2023-05-22 DIAGNOSIS — Z800 Family history of malignant neoplasm of digestive organs: Secondary | ICD-10-CM | POA: Insufficient documentation

## 2023-05-22 DIAGNOSIS — D6481 Anemia due to antineoplastic chemotherapy: Secondary | ICD-10-CM | POA: Insufficient documentation

## 2023-05-22 DIAGNOSIS — T451X5A Adverse effect of antineoplastic and immunosuppressive drugs, initial encounter: Secondary | ICD-10-CM | POA: Diagnosis not present

## 2023-05-22 DIAGNOSIS — I700 Atherosclerosis of aorta: Secondary | ICD-10-CM | POA: Insufficient documentation

## 2023-05-22 DIAGNOSIS — C799 Secondary malignant neoplasm of unspecified site: Secondary | ICD-10-CM | POA: Diagnosis not present

## 2023-05-22 DIAGNOSIS — K838 Other specified diseases of biliary tract: Secondary | ICD-10-CM | POA: Diagnosis not present

## 2023-05-22 DIAGNOSIS — K435 Parastomal hernia without obstruction or  gangrene: Secondary | ICD-10-CM | POA: Diagnosis not present

## 2023-05-22 DIAGNOSIS — R600 Localized edema: Secondary | ICD-10-CM | POA: Diagnosis not present

## 2023-05-22 DIAGNOSIS — I10 Essential (primary) hypertension: Secondary | ICD-10-CM | POA: Insufficient documentation

## 2023-05-22 DIAGNOSIS — D701 Agranulocytosis secondary to cancer chemotherapy: Secondary | ICD-10-CM | POA: Diagnosis not present

## 2023-05-22 DIAGNOSIS — Z7901 Long term (current) use of anticoagulants: Secondary | ICD-10-CM | POA: Insufficient documentation

## 2023-05-22 DIAGNOSIS — C7951 Secondary malignant neoplasm of bone: Secondary | ICD-10-CM | POA: Diagnosis not present

## 2023-05-22 DIAGNOSIS — K521 Toxic gastroenteritis and colitis: Secondary | ICD-10-CM | POA: Diagnosis not present

## 2023-05-22 DIAGNOSIS — I2510 Atherosclerotic heart disease of native coronary artery without angina pectoris: Secondary | ICD-10-CM | POA: Insufficient documentation

## 2023-05-22 DIAGNOSIS — Z87891 Personal history of nicotine dependence: Secondary | ICD-10-CM | POA: Diagnosis not present

## 2023-05-22 DIAGNOSIS — N281 Cyst of kidney, acquired: Secondary | ICD-10-CM | POA: Diagnosis not present

## 2023-05-22 DIAGNOSIS — E7800 Pure hypercholesterolemia, unspecified: Secondary | ICD-10-CM | POA: Diagnosis not present

## 2023-05-22 DIAGNOSIS — K5900 Constipation, unspecified: Secondary | ICD-10-CM | POA: Insufficient documentation

## 2023-05-22 DIAGNOSIS — K219 Gastro-esophageal reflux disease without esophagitis: Secondary | ICD-10-CM | POA: Diagnosis not present

## 2023-05-22 DIAGNOSIS — Z5112 Encounter for antineoplastic immunotherapy: Secondary | ICD-10-CM | POA: Diagnosis not present

## 2023-05-22 DIAGNOSIS — Z5111 Encounter for antineoplastic chemotherapy: Secondary | ICD-10-CM | POA: Diagnosis not present

## 2023-05-22 LAB — CBC WITH DIFFERENTIAL/PLATELET
Abs Immature Granulocytes: 0.03 10*3/uL (ref 0.00–0.07)
Basophils Absolute: 0 10*3/uL (ref 0.0–0.1)
Basophils Relative: 0 %
Eosinophils Absolute: 0.1 10*3/uL (ref 0.0–0.5)
Eosinophils Relative: 3 %
HCT: 36.8 % (ref 36.0–46.0)
Hemoglobin: 11.7 g/dL — ABNORMAL LOW (ref 12.0–15.0)
Immature Granulocytes: 1 %
Lymphocytes Relative: 23 %
Lymphs Abs: 1.2 10*3/uL (ref 0.7–4.0)
MCH: 30.1 pg (ref 26.0–34.0)
MCHC: 31.8 g/dL (ref 30.0–36.0)
MCV: 94.6 fL (ref 80.0–100.0)
Monocytes Absolute: 0.3 10*3/uL (ref 0.1–1.0)
Monocytes Relative: 6 %
Neutro Abs: 3.4 10*3/uL (ref 1.7–7.7)
Neutrophils Relative %: 67 %
Platelets: 176 10*3/uL (ref 150–400)
RBC: 3.89 MIL/uL (ref 3.87–5.11)
RDW: 16.4 % — ABNORMAL HIGH (ref 11.5–15.5)
WBC: 5 10*3/uL (ref 4.0–10.5)
nRBC: 0 % (ref 0.0–0.2)

## 2023-05-22 LAB — COMPREHENSIVE METABOLIC PANEL
ALT: 23 U/L (ref 0–44)
AST: 32 U/L (ref 15–41)
Albumin: 3.9 g/dL (ref 3.5–5.0)
Alkaline Phosphatase: 79 U/L (ref 38–126)
Anion gap: 9 (ref 5–15)
BUN: 13 mg/dL (ref 8–23)
CO2: 26 mmol/L (ref 22–32)
Calcium: 8.7 mg/dL — ABNORMAL LOW (ref 8.9–10.3)
Chloride: 103 mmol/L (ref 98–111)
Creatinine, Ser: 0.71 mg/dL (ref 0.44–1.00)
GFR, Estimated: >60 mL/min (ref >60)
Glucose, Bld: 95 mg/dL (ref 70–99)
Potassium: 3.5 mmol/L (ref 3.5–5.1)
Sodium: 138 mmol/L (ref 135–145)
Total Bilirubin: 0.5 mg/dL (ref 0.3–1.2)
Total Protein: 6.6 g/dL (ref 6.5–8.1)

## 2023-05-22 LAB — MAGNESIUM: Magnesium: 1.5 mg/dL — ABNORMAL LOW (ref 1.7–2.4)

## 2023-05-22 MED ORDER — DIPHENHYDRAMINE HCL 50 MG/ML IJ SOLN
25.0000 mg | Freq: Once | INTRAMUSCULAR | Status: AC
  Administered 2023-05-22: 25 mg via INTRAVENOUS
  Filled 2023-05-22: qty 1

## 2023-05-22 MED ORDER — ATROPINE SULFATE 1 MG/ML IV SOLN
0.5000 mg | Freq: Once | INTRAVENOUS | Status: AC
  Administered 2023-05-22: 0.5 mg via INTRAVENOUS

## 2023-05-22 MED ORDER — SODIUM CHLORIDE 0.9 % IV SOLN
10.0000 mg | Freq: Once | INTRAVENOUS | Status: AC
  Administered 2023-05-22: 10 mg via INTRAVENOUS
  Filled 2023-05-22: qty 10

## 2023-05-22 MED ORDER — SODIUM CHLORIDE 0.9 % IV SOLN
100.0000 mg/m2 | Freq: Once | INTRAVENOUS | Status: AC
  Administered 2023-05-22: 180 mg via INTRAVENOUS
  Filled 2023-05-22: qty 4

## 2023-05-22 MED ORDER — HEPARIN SOD (PORK) LOCK FLUSH 100 UNIT/ML IV SOLN
500.0000 [IU] | Freq: Once | INTRAVENOUS | Status: AC | PRN
  Administered 2023-05-22: 500 [IU]
  Filled 2023-05-22: qty 5

## 2023-05-22 MED ORDER — SODIUM CHLORIDE 0.9 % IV SOLN
Freq: Once | INTRAVENOUS | Status: AC
  Filled 2023-05-22: qty 250

## 2023-05-22 MED ORDER — DIPHENOXYLATE-ATROPINE 2.5-0.025 MG PO TABS: 1.0000 | ORAL_TABLET | Freq: Four times a day (QID) | ORAL | 0 refills | Status: DC | PRN

## 2023-05-22 MED ORDER — PALONOSETRON HCL INJECTION 0.25 MG/5ML
0.2500 mg | Freq: Once | INTRAVENOUS | Status: AC
  Administered 2023-05-22: 0.25 mg via INTRAVENOUS
  Filled 2023-05-22: qty 5

## 2023-05-22 MED ORDER — MAGNESIUM SULFATE 2 GM/50ML IV SOLN
2.0000 g | Freq: Every day | INTRAVENOUS | Status: DC | PRN
  Administered 2023-05-22: 2 g via INTRAVENOUS
  Filled 2023-05-22: qty 50

## 2023-05-22 MED ORDER — SODIUM CHLORIDE 0.9 % IV SOLN
6.0000 mg/kg | Freq: Once | INTRAVENOUS | Status: AC
  Administered 2023-05-22: 400 mg via INTRAVENOUS
  Filled 2023-05-22: qty 20

## 2023-05-22 NOTE — Assessment & Plan Note (Signed)
Chemotherapy plan as listed above 

## 2023-05-22 NOTE — Assessment & Plan Note (Signed)
patient will receive long-acting GCSF prophylaxis on D3 

## 2023-05-22 NOTE — Assessment & Plan Note (Signed)
History of PE and bilateral lower extremity DVT  05/18/23 Repeat right LE US showed chronic non occlusive DVT Continue  Eliquis 2.5 mg twice daily for anticoagulation prophylaxis. Recommend leg elevation and compression stocking.

## 2023-05-22 NOTE — Assessment & Plan Note (Signed)
continue lomotil  QID PRN as instructed.

## 2023-05-22 NOTE — Assessment & Plan Note (Addendum)
History of stage IIIC Rectal cancer, s/p TNT, followed by 09/17/19 APR/posterior vaginectomy/TAH/BSO/VY-flap, pT4b pN0 with close vaginal margin 0.2 mm.  Uterus and ovaries negative for malignancy. palliative radiation to vaginal recurrence- 01/19/21 recurrence with lung metastasis.-Palliative -FOLFIRI plus bevacizumab.  Irinotecan was dropped in November 2022 due to side effects. Negative for UGT1A1*28 - radiographically stable, rise of CEA-July 2023 CT lung metastasis worse--> Dec 2023 PET showed progression in pelvic lymph nodes and bone lesions,--> 2nd line irinotecan +panitumumab-->CT March 2024 stable. --> June 2024 CT showed mild lung progression/Progressive left iliac bone metastasis   Labs are reviewed and discussed with patient, CEA nadir was in 490, currently gradually increasing, 900s Proceed with irinotecan +panitumumab  repeat CT in Sept 2024

## 2023-05-22 NOTE — Assessment & Plan Note (Signed)
potassium is stable.  Continue potassium chloride 20 mEq  daily.  

## 2023-05-22 NOTE — Assessment & Plan Note (Signed)
Hb stable observation 

## 2023-05-22 NOTE — Patient Instructions (Signed)

## 2023-05-22 NOTE — Assessment & Plan Note (Signed)
If magnesium is 1.0 - 1.2:  administrate 6 gm IV magnesium sulfate If magnesium is 1.2-1.4, administrate 4 gm IV magnesium sulfate If magnesium is 1.5-1.6:  administrate 2 gm IV magnesium sulfate.  Continue slow mag orally 64mg twice daily.  

## 2023-05-22 NOTE — Patient Instructions (Signed)
[facility]  Discharge Instructions: Thank you for choosing [facility] to provide your oncology and hematology care.  If you have a lab appointment with the Cancer Center, please go directly to the Cancer Center and check in at the registration area.  Wear comfortable clothing and clothing appropriate for easy access to any Portacath or PICC line.   We strive to give you quality time with your provider. You may need to reschedule your appointment if you arrive late (15 or more minutes).  Arriving late affects you and other patients whose appointments are after yours.  Also, if you miss three or more appointments without notifying the office, you may be dismissed from the clinic at the provider's discretion.      For prescription refill requests, have your pharmacy contact our office and allow 72 hours for refills to be completed.    Today you received the following chemotherapy and/or immunotherapy agents: Irinotecan + Panitumumab    To help prevent nausea and vomiting after your treatment, we encourage you to take your nausea medication as directed.  BELOW ARE SYMPTOMS THAT SHOULD BE REPORTED IMMEDIATELY: *FEVER GREATER THAN 100.4 F (38 C) OR HIGHER *CHILLS OR SWEATING *NAUSEA AND VOMITING THAT IS NOT CONTROLLED WITH YOUR NAUSEA MEDICATION *UNUSUAL SHORTNESS OF BREATH *UNUSUAL BRUISING OR BLEEDING *URINARY PROBLEMS (pain or burning when urinating, or frequent urination) *BOWEL PROBLEMS (unusual diarrhea, constipation, pain near the anus) TENDERNESS IN MOUTH AND THROAT WITH OR WITHOUT PRESENCE OF ULCERS (sore throat, sores in mouth, or a toothache) UNUSUAL RASH, SWELLING OR PAIN  UNUSUAL VAGINAL DISCHARGE OR ITCHING   Items with * indicate a potential emergency and should be followed up as soon as possible or go to the Emergency Department if any problems should occur.  Please show the CHEMOTHERAPY ALERT CARD or IMMUNOTHERAPY ALERT CARD  at check-in to the Emergency Department and triage nurse.  Should you have questions after your visit or need to cancel or reschedule your appointment, please contact [facility]  [phone] and follow the prompts.  Office hours are 8:00 a.m. to 4:30 p.m. Monday - Friday. Please note that voicemails left after 4:00 p.m. may not be returned until the following business day.  We are closed weekends and major holidays. You have access to a nurse at all times for urgent questions. Please call the main number to the clinic [phone] and follow the prompts.  For any non-urgent questions, you may also contact your provider using MyChart. We now offer e-Visits for anyone 51 and older to request care online for non-urgent symptoms. For details visit [email or web address].   Also download the MyChart app! Go to the app store, search "MyChart", open the app, select [place], and log in with your MyChart username and password.

## 2023-05-22 NOTE — Progress Notes (Signed)
Pt here for follow up. Pt reports that swelling to right leg has gone down and denies pain.

## 2023-05-22 NOTE — Progress Notes (Signed)
Hematology/Oncology Progress note [phone] [phone]      CHIEF COMPLAINTS/REASON FOR VISIT:  Follow up for rectal cancer  ASSESSMENT & PLAN:   Cancer Staging  Rectal cancer [facility]) Staging form: Colon and Rectum, AJCC 8th Edition - Pathologic stage from 10/06/2019: Stage IIC (ypT4b, pN0, cM0) - Signed by [name], MD on 10/06/2019 - Clinical stage from 06/30/2020: Jacqueline Yoder - Signed by [name], MD on 04/17/2022 - Pathologic: Stage Unknown (rpTX, pNX, cM1) - Signed by [name], MD on 01/31/2021   Rectal cancer [facility]) History of stage IIIC Rectal cancer, s/p TNT, followed by 09/17/19 APR/posterior vaginectomy/TAH/BSO/VY-flap, pT4b pN0 with close vaginal margin 0.2 mm.  Uterus and ovaries negative for malignancy. palliative radiation to vaginal recurrence- 01/19/21 recurrence with lung metastasis.-Palliative -FOLFIRI plus bevacizumab.  Irinotecan was dropped in November 2022 due to side effects. Negative for UGT1A1*28 - radiographically stable, rise of CEA-July 2023 CT lung metastasis worse--> Dec 2023 PET showed progression in pelvic lymph nodes and bone lesions,--> 2nd line irinotecan +panitumumab-->CT March 2024 stable. --> June 2024 CT showed mild lung progression/Progressive left iliac bone metastasis   Labs are reviewed and discussed with patient, CEA nadir was in 490, currently gradually increasing, 900s Proceed with irinotecan +panitumumab  repeat CT in Sept 2024  Encounter for antineoplastic chemotherapy Chemotherapy plan as listed above.   Anemia due to antineoplastic chemotherapy Hb stable observation  Chemotherapy induced neutropenia (HCC) patient will receive long-acting GCSF prophylaxis on D3  History of pulmonary embolism History of PE and bilateral lower extremity DVT  05/18/23 Repeat right LE US showed chronic non occlusive DVT Continue  Eliquis 2.5 mg twice daily for anticoagulation prophylaxis. Recommend leg elevation and compression  stocking.   Chemotherapy induced diarrhea continue lomotil  QID PRN as instructed.   Hypokalemia  potassium is stable.  Continue potassium chloride 20 mEq daily.   Hypomagnesemia If magnesium is 1.0 - 1.2:  administrate 6 gm IV magnesium sulfate If magnesium is 1.2-1.4, administrate 4 gm IV magnesium sulfate If magnesium is 1.5-1.6:  administrate 2 gm IV magnesium sulfate.  Continue slow mag orally 64mg  twice daily.    Follow up 2 weeks Lab MD irinotecan + panitumumab All questions were answered. The patient knows to call the clinic with any problems, questions or concerns.  [name], MD, PhD [facility] Hematology Oncology 05/22/2023      HISTORY OF PRESENTING ILLNESS:  Patient initially presented with complaints of postmenopausal bleeding on 08/16/2018.  History of was menopausal vaginal bleeding in 2016 which resulted in cervical polypectomy.  Pathology 02/04/2015 showed cervical polyp, consistent with benign endometrial polyp.  [name] after polypectomy due to anxiety associated with pelvic exams.  pelvic exam on 08/16/2018 reviewed cervical abnormality and from enlarged uterus. Seen by Dr. [name] on 10/29/2018.  Endometrial biopsy and a Pap smear was performed. 10/29/2018 Pap smear showed adenocarcinoma, favor endometrial origin. 10/29/2018 endometrial biopsy showed endometrioid carcinoma, FIGO grade 1.  10/29/2018- TA & TV Ultrasound revealed: Anteverted uterus measuring 8.7 x 5.6 x 6.4 cm without evidence of focal masses.  The endometrium measuring 24.1 mm (thickened) and heterogeneous.  Right and left ovaries not visualized.  No adnexal masses identified.  No free fluid in cul-de-sac.  Patient was seen by Dr. [name] in clinic on 11/13/2018.  Cervical exam reveals 2 cm exophytic irregular mass consistent with malignancy.   11/19/2018 CT chest abdomen pelvis with contrast showed thickened endometrium with some irregularity compatible with the provided diagnosis of  endometrial malignancy.  There is a mildly prominent left inguinal node 1.4 cm.  Patient was seen by Dr. [name] on 11/20/2018 and left groin lymph node biopsy was recommended.  11/26/2018 patient underwent left inguinal lymph node biopsy. Pathology showed metastatic adenocarcinoma consistent with colorectal origin.  CDX 2+.  Case was discussed on tumor board.  Recommend colonoscopy for further evaluation.  Patient reports significant weight loss 30 pounds over the last year.  Chronic vaginal spotting. Change of bowel habits the past few months.  More constipated.  Family history positive for brother who has colon cancer prostate cancer.  patient has underwent colonoscopy on 12/03/2018 which reviewed a nonobstructing large mass in the rectum.  Also chronic fistula.  Mass was not circumferential.  This was biopsied with a cold forceps for histology.  Pathology came back hyperplastic polyp negative for dysplasia and malignancy. [name], patient underwent flex sigmoidoscopy on 12/06/2018 with rebiopsy of the rectal mass. This time biopsy results came back positive for invasive colorectal adenocarcinoma, moderately differentiated. Immunotherapy for nearly mismatch repair protein (MMR ) was performed.  There is no loss of MMR expression.  low probability of MSI high.   # Seen by [name] for evaluation of resectability for rectal cancer. In addition, she also had a second opinion with [facility] pathology where her endometrial biopsy pathology was changed to  adenocarcinoma, consistent with colorectal primary.   [name]. She has home health that has been assisting with ostomy care  Patient was also evaluated by [facility] oncology.  Recommendation is to proceed with TNT with concurrent chemoradiation followed by neoadjuvant chemotherapy followed by surgical resection. Patient prefers to have treatment done locally with [facility].   # Oncology  Treatment:  02/03/2019- 03/19/2019  concurrent Xeloda and radiation.  Xeloda dose 825mg  /m2 BID - rounded to 1650mg  BID- on days of radiation. 04/09/2019, started on FOLFOX with bolus early.  Omitted.  07/16/2019 finished 8 cycles of FOLFOX. 09/17/19 APR/posterior vaginectomy/TAH/BSO/VY-flap pT4b pN0 with close vaginal margin 0.2 mm.  Uterus and ovaries negative for malignancy. Patient reports bilateral lower extremity numbness and tingling, intermittent, left worse than right. She has lost a lot of weight since her APR surgery.   #Family history with half brother having's history of colon cancer prostate cancer.  Personal history of colorectal cancer.  Patient has not decided if she wants genetic testing.    # history of PE( 01/13/2020)  in the bilateral lower extremity DVT (01/13/2020).   She finishes 6 months of anticoagulation with Eliquis 5 mg twice daily. Now switched to Eliquis 2.5 mg twice daily..  # She has now developed recurrent disease. #06/30/20  vaginal introitus mass biopsied. Pathology is consistent with metastatic colorectal adenocarcinoma I have discussed with [name]  Dr. Luciano Cutter and the mass is not resectable. Patient has also had colonoscopy by Dr. Tobi Bastos yesterday. Normal examination. # 07/16/2020 cycle 1 FOLFIRI  # 07/20/2020 PET scan was done for further evaluation, images are consistent with local recurrence, no distant metastasis. #Discussed with radiation oncology Dr. Rushie Chestnut will recommends concurrent chemotherapy and radiation. 08/02/2020-08/16/2020, patient starts radiation.  Xeloda was held due to neutropenia 08/17/2020,-09/06/2020 Xeloda 1500 mg twice daily concurrently with radiation  01/31/21 started on FOLFIRI + Bev 05/18/2021 CT chest abdomen pelvis showed Previously noted enlargement of bilateral inguinal lymph nodes is resolved, consistent with treatment response of nodal metastatic disease. Interval decrease in size of multiple small bilateral pulmonary nodules,  consistent with treatment response  of pulmonary metastatic disease. No evidence of new metastatic disease. 05/24/2021 - 08/30/2021, continued on FOLFIRI plus bevacizumab.  Irinotecan dose was reduced, eventually 100mg /m2  09/02/2021, CT chest abdomen pelvis without contrast Showed small bilateral pulmonary nodules, unchanged.  Stable metastatic disease.  No noncontrast evidence of new metastatic disease in the chest abdomen pelvis.  Small parastomal hernia.  Enlargement of main pulmonary artery.  Coronary artery disease.  09/13/2021, maintenance 5-FU/bevacizumab 11/28/2021, 5-FU/Irinotecan/bevacizumab.  Irinotecan 100 mg/m2 was added back due to progressively increasing CEA.    INTERVAL HISTORY SORELLE TOKARZ is a 77 y.o. female who has above history reviewed by me presents for follow-up of rectal cancer. On Irinotecan and panitumumab. she tolerates well.   She was accompanied by her daughter. - chronic diarrhea is manageable.  She take Lomotil PRN, usually 1-2 time per day.  -she denies any additional episode of vaginal spotting  -right lower extremity edema, US showed non occlusive DVT, edema has improved.   Review of Systems  Constitutional:  Positive for fatigue. Negative for appetite change, chills, fever and unexpected weight change.  HENT:   Negative for hearing loss and voice change.   Eyes:  Negative for eye problems.  Respiratory:  Negative for chest tightness and cough.   Cardiovascular:  Negative for chest pain.  Gastrointestinal:  Positive for diarrhea. Negative for abdominal distention, abdominal pain, blood in stool, constipation and nausea.  Endocrine: Negative for hot flashes.  Genitourinary:  Negative for difficulty urinating and frequency.   Musculoskeletal:  Positive for arthralgias.  Skin:  Negative for itching.  Neurological:  Negative for extremity weakness and numbness.  Hematological:  Negative for adenopathy.  Psychiatric/Behavioral:  Negative for confusion.      MEDICAL HISTORY:  Past Medical History:  Diagnosis Date   Allergy    Arthritis    Blood clot in vein    Family history of colon cancer    GERD (gastroesophageal reflux disease)    Hypercholesteremia    Hypertension    Hypertension    Lower extremity edema    Personal history of chemotherapy    Rectal cancer (HCC) 12/2018   Urinary incontinence     SURGICAL HISTORY: Past Surgical History:  Procedure Laterality Date   ABDOMINAL HYSTERECTOMY     CHOLECYSTECTOMY  1971   COLONOSCOPY WITH PROPOFOL N/A 12/03/2018   Procedure: COLONOSCOPY WITH PROPOFOL;  Surgeon: Midge Minium, MD;  Location: [facility] ENDOSCOPY;  Service: Endoscopy;  Laterality: N/A;   COLONOSCOPY WITH PROPOFOL N/A 07/15/2020   Procedure: COLONOSCOPY WITH PROPOFOL;  Surgeon: Wyline Mood, MD;  Location: [facility] ENDOSCOPY;  Service: Gastroenterology;  Laterality: N/A;   FLEXIBLE SIGMOIDOSCOPY N/A 12/06/2018   Procedure: FLEXIBLE SIGMOIDOSCOPY;  Surgeon: Wyline Mood, MD;  Location: [facility] ENDOSCOPY;  Service: Endoscopy;  Laterality: N/A;   LAPAROSCOPIC COLOSTOMY  01/06/2019   PORTACATH PLACEMENT N/A 04/03/2019   Procedure: INSERTION PORT-A-CATH;  Surgeon: Leafy Ro, MD;  Location: [facility] ORS;  Service: General;  Laterality: N/A;    SOCIAL HISTORY: Social History   Socioeconomic History   Marital status: Widowed    Spouse name: Not on file   Number of children: Not on file   Years of education: Not on file   Highest education level: Not on file  Occupational History   Not on file  Tobacco Use   Smoking status: Former    Current packs/day: 0.00    Types: Cigarettes    Quit date: 12/02/1977    Years since quitting: 45.4   Smokeless tobacco:  Former  Building services engineer status: Never Used  Substance and Sexual Activity   Alcohol use: Never   Drug use: Never   Sexual activity: Not Currently    Birth control/protection: None  Other Topics Concern   Not on file  Social History Narrative   Lives with daughter    Social Determinants of Health   Financial Resource Strain: Medium Risk (06/30/2022)   Overall Financial Resource Strain (CARDIA)    Difficulty of Paying Living Expenses: Somewhat hard  Food Insecurity: No Food Insecurity (06/30/2022)   Hunger Vital Sign    Worried About Running Out of Food in the Last Year: Never true    Ran Out of Food in the Last Year: Never true  Transportation Needs: No Transportation Needs (06/30/2022)   PRAPARE - Administrator, Civil Service (Medical): No    Lack of Transportation (Non-Medical): No  Physical Activity: Inactive (06/30/2022)   Exercise Vital Sign    Days of Exercise per Week: 0 days    Minutes of Exercise per Session: 0 min  Stress: No Stress Concern Present (06/30/2022)   [employer] of Occupational Health - Occupational Stress Questionnaire    Feeling of Stress : Not at all  Social Connections: Moderately Isolated (06/30/2022)   Social Connection and Isolation Panel [NHANES]    Frequency of Communication with Friends and Family: More than three times a week    Frequency of Social Gatherings with Friends and Family: Twice a week    Attends Religious Services: More than 4 times per year    Active Member of [employer] or Organizations: No    Attends Banker Meetings: Never    Marital Status: Widowed  Intimate Partner Violence: Not At Risk (06/30/2022)   Humiliation, Afraid, Rape, and Kick questionnaire    Fear of Current or Ex-Partner: No    Emotionally Abused: No    Physically Abused: No    Sexually Abused: No    FAMILY HISTORY: Family History  Problem Relation Age of Onset   Colon cancer Brother 45       exposure to chemicals [place]   Hypertension Mother    Stroke Mother    Kidney failure Father    Breast cancer Neg Hx    Ovarian cancer Neg Hx     ALLERGIES:  is allergic to sulfamethoxazole-trimethoprim.  MEDICATIONS:  Current Outpatient Medications  Medication Sig Dispense Refill   Cholecalciferol  (VITAMIN D3) 2000 units capsule Take 2,000 Units by mouth daily.     clindamycin (CLINDAGEL) 1 % gel Apply topically 2 (two) times daily. 30 g 5   diclofenac sodium (VOLTAREN) 1 % GEL Apply 2 g topically 4 (four) times daily as needed (joint pain).  11   ELIQUIS 2.5 MG TABS tablet TAKE 1 TABLET BY MOUTH TWICE  DAILY 200 tablet 2   fluticasone (FLONASE) 50 MCG/ACT nasal spray USE 1 SPRAY IN EACH NOSTRIL ONCE DAILY 16 g 3   ipratropium (ATROVENT) 0.03 % nasal spray Place 1 spray into both nostrils 2 (two) times daily.     ketoconazole (NIZORAL) 2 % cream Apply 1 Application topically daily. For up to 2 weeks. May repeat if need. 30 g 2   lidocaine-prilocaine (EMLA) cream Apply 1 application. topically as needed. 30 g 6   loperamide (IMODIUM) 2 MG capsule Take 1 capsule (2 mg total) by mouth See admin instructions. With onset of loose stool, take 4mg  followed by 2mg  every 2 hours,  Maximum: 16  mg/day 120 capsule 1   loratadine (CLARITIN) 10 MG tablet Take 10 mg by mouth daily.     magnesium chloride (SLOW-MAG) 64 MG TBEC SR tablet Take 1 tablet (64 mg total) by mouth 2 (two) times daily. 60 tablet 2   Multiple Vitamins-Minerals (ONE-A-DAY WOMENS 50 PLUS PO) Take 1 tablet by mouth daily.      nystatin (MYCOSTATIN/NYSTOP) powder APPLY 1 POWDER TOPICALLY THREE TIMES DAILY 45 g 0   potassium chloride SA (KLOR-CON M) 20 MEQ tablet TAKE 1 TABLET BY MOUTH DAILY 90 tablet 1   simvastatin (ZOCOR) 40 MG tablet Take 1 tablet (40 mg total) by mouth at bedtime. 100 tablet 3   triamcinolone cream (KENALOG) 0.5 % Apply 1 Application topically 2 (two) times daily. To affected areas, for up to 2 weeks. 30 g 2   triamterene-hydrochlorothiazide (DYAZIDE) 37.5-25 MG capsule Take 1 each (1 capsule total) by mouth daily. 100 capsule 3   zinc gluconate 50 MG tablet Take 50 mg by mouth daily.     diphenoxylate-atropine (LOMOTIL) 2.5-0.025 MG tablet Take 1 tablet by mouth 4 (four) times daily as needed for diarrhea or loose  stools. 120 tablet 0   No current facility-administered medications for this visit.   Facility-Administered Medications Ordered in Other Visits  Medication Dose Route Frequency Provider Last Rate Last Admin   heparin lock flush 100 UNIT/ML injection              PHYSICAL EXAMINATION: ECOG PERFORMANCE STATUS: 1 - Symptomatic but completely ambulatory  Physical Exam Constitutional:      General: She is not in acute distress. HENT:     Head: Normocephalic and atraumatic.  Eyes:     General: No scleral icterus. Cardiovascular:     Rate and Rhythm: Normal rate.     Heart sounds: Normal heart sounds.  Pulmonary:     Effort: Pulmonary effort is normal. No respiratory distress.     Breath sounds: No wheezing.  Abdominal:     General: Bowel sounds are normal. There is no distension.     Palpations: Abdomen is soft.     Comments: + Colostomy bag   Musculoskeletal:        General: No deformity. Normal range of motion.     Cervical back: Normal range of motion and neck supple.  Skin:    General: Skin is warm and dry.  Neurological:     Mental Status: She is alert and oriented to person, place, and time. Mental status is at baseline.     Cranial Nerves: No cranial nerve deficit.     Coordination: Coordination normal.       LABORATORY DATA:  I have reviewed the data as listed    Latest Ref Rng & Units 05/22/2023    8:35 AM 05/08/2023    8:15 AM 04/24/2023    8:35 AM  CBC  WBC 4.0 - 10.5 K/uL 5.0  4.4  5.9   Hemoglobin 12.0 - 15.0 g/dL 16.1  09.6  04.5   Hematocrit 36.0 - 46.0 % 36.8  35.9  35.2   Platelets 150 - 400 K/uL 176  203  210       Latest Ref Rng & Units 05/22/2023    8:35 AM 05/08/2023    8:15 AM 04/24/2023    8:35 AM  CMP  Glucose 70 - 99 mg/dL 95  98  98   BUN 8 - 23 mg/dL 13  14  13    Creatinine 0.44 -  1.00 mg/dL 4.69  6.29  5.28   Sodium 135 - 145 mmol/L 138  138  138   Potassium 3.5 - 5.1 mmol/L 3.5  3.7  3.4   Chloride 98 - 111 mmol/L 103  105  103   CO2  22 - 32 mmol/L 26  28  27    Calcium 8.9 - 10.3 mg/dL 8.7  8.7  8.5   Total Protein 6.5 - 8.1 g/dL 6.6  6.4  6.3   Total Bilirubin 0.3 - 1.2 mg/dL 0.5  0.6  0.5   Alkaline Phos 38 - 126 U/L 79  72  104   AST 15 - 41 U/L 32  39  43   ALT 0 - 44 U/L 23  27  55      RADIOGRAPHIC STUDIES: I have personally reviewed the radiological images as listed and agreed with the findings in the report. US Venous Img Lower Unilateral Right  Result Date: 05/18/2023 CLINICAL DATA:  Right lower extremity swelling EXAM: RIGHT LOWER EXTREMITY VENOUS DOPPLER ULTRASOUND TECHNIQUE: Gray-scale sonography with compression, as well as color and duplex ultrasound, were performed to evaluate the deep venous system(s) from the level of the common femoral vein through the popliteal and proximal calf veins. COMPARISON:  01/13/2020 FINDINGS: VENOUS Small amount of nonocclusive thrombus noted in the right posterior tibial vein. The thrombus is echogenic and eccentric which suggests that it is chronic. Otherwise normal compressibility of the common femoral, superficial femoral, and popliteal veins, as well as the visualized calf veins. Visualized portions of profunda femoral vein and great saphenous vein unremarkable. No additional filling defects to suggest DVT on grayscale or color Doppler imaging. Doppler waveforms show normal direction of venous flow, normal respiratory plasticity and response to augmentation. Limited views of the contralateral common femoral vein are unremarkable. OTHER 3.9 x 1.1 x 1.2 cm fluid collection in the right popliteal fossa likely Baker's cyst. Limitations: none IMPRESSION: Small amount of chronic nonocclusive DVT noted in the right posterior tibial vein. Electronically Signed   By: Acquanetta Belling M.D.   On: 05/18/2023 14:36   CT CHEST ABDOMEN PELVIS W CONTRAST  Result Date: 03/26/2023 CLINICAL DATA:  Follow-up metastatic rectal cancer EXAM: CT CHEST, ABDOMEN, AND PELVIS WITH CONTRAST TECHNIQUE:  Multidetector CT imaging of the chest, abdomen and pelvis was performed following the standard protocol during bolus administration of intravenous contrast. RADIATION DOSE REDUCTION: This exam was performed according to the departmental dose-optimization program which includes automated exposure control, adjustment of the mA and/or kV according to patient size and/or use of iterative reconstruction technique. CONTRAST:  100 mL OMNIPAQUE IOHEXOL 300 MG/ML  SOLN COMPARISON:  CT chest abdomen pelvis dated 12/25/2022. PET-CT dated 09/19/2022. FINDINGS: CT CHEST FINDINGS Cardiovascular: The heart is normal in size. No pericardial effusion. No evidence of thoracic aortic aneurysm. Atherosclerotic calcifications of the aortic arch. Mild three-vessel coronary atherosclerosis. Right chest port terminates in the upper right atrium. Mediastinum/Nodes: No suspicious mediastinal lymphadenopathy. Visualized thyroid is unremarkable. Lungs/Pleura: Scattered pulmonary nodules/metastases, including a dominant bilobed nodule in the medial right middle lobe measuring 14 x 11 mm (series 4/image 94), previously 10 x 7 mm. Mild centrilobular and paraseptal emphysematous changes, upper lung predominant. No focal consolidation. No pleural effusion pneumothorax. Musculoskeletal: Mild degenerative changes of the thoracic spine. Stable deformity of the left inferior scapula (series 2/image 32). No focal osseous lesions. Proximal right humerus metastasis is not well visualized on the current study. CT ABDOMEN PELVIS FINDINGS Hepatobiliary: Subcapsular low-density in the anterior/inferior right  hepatic lobe (series 2/image 68), chronic, non FDG avid on prior PET-CT, benign. No suspicious hepatic lesions. Status post cholecystectomy. Mild to moderate central intrahepatic ductal dilatation, possibly mildly progressive, but present on multiple priors and favored to be postsurgical. Dilated common duct, measuring 10 mm (coronal image 31), and  smoothly tapering at the ampulla. Pancreas: Within normal limits. Spleen: 12 mm cyst in the posterior spleen (series 2/image 57), benign. Adrenals/Urinary Tract: Adrenal glands are normal limits. Bilateral renal cysts, measuring up to 5.9 cm in the right upper kidney (series 2/image 56) and 5.9 cm in the left lower kidney (series 2/image 76), benign (Bosniak I). No hydronephrosis. Bladder is within normal limits. Stomach/Bowel: Stomach is within normal limits. No evidence of bowel obstruction. Normal appendix (series 2/image 84). Status post abdominoperineal resection with mid transverse colostomy. Vascular/Lymphatic: No evidence of abdominal aortic aneurysm. Atherosclerotic calcifications of the abdominal aorta and branch vessels. 6 mm short axis right external iliac node (series 2/image 105), previously 7 mm. 11 mm short axis right inguinal node (series 2/image 102), previously 10 mm. Reproductive: Status post hysterectomy with bilateral salpingo oophorectomy. Other: No abdominopelvic ascites. Postsurgical changes involving the anterior abdominal wall. Small fat containing parastomal hernia (series 2/image 81). 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 Degenerative changes of the visualized thoracolumbar spine. IMPRESSION: Status post abdominoperineal resection with mid transverse colostomy. Progressive pulmonary metastases, as above. Small right pelvic nodal metastases, grossly unchanged. Progressive left iliac bone metastasis, as above. Electronically Signed   By: Charline Bills M.D.   On: 03/26/2023 23:43      US Venous Img Lower Unilateral Right  Result Date: 05/18/2023 CLINICAL DATA:  Right lower extremity swelling EXAM: RIGHT LOWER EXTREMITY VENOUS DOPPLER ULTRASOUND TECHNIQUE: Gray-scale sonography with compression, as well as color and duplex ultrasound, were performed to evaluate the deep venous system(s) from the level of the  common femoral vein through the popliteal and proximal calf veins. COMPARISON:  01/13/2020 FINDINGS: VENOUS Small amount of nonocclusive thrombus noted in the right posterior tibial vein. The thrombus is echogenic and eccentric which suggests that it is chronic. Otherwise normal compressibility of the common femoral, superficial femoral, and popliteal veins, as well as the visualized calf veins. Visualized portions of profunda femoral vein and great saphenous vein unremarkable. No additional filling defects to suggest DVT on grayscale or color Doppler imaging. Doppler waveforms show normal direction of venous flow, normal respiratory plasticity and response to augmentation. Limited views of the contralateral common femoral vein are unremarkable. OTHER 3.9 x 1.1 x 1.2 cm fluid collection in the right popliteal fossa likely Baker's cyst. Limitations: none IMPRESSION: Small amount of chronic nonocclusive DVT noted in the right posterior tibial vein. Electronically Signed   By: Acquanetta Belling M.D.   On: 05/18/2023 14:36   CT CHEST ABDOMEN PELVIS W CONTRAST  Result Date: 03/26/2023 CLINICAL DATA:  Follow-up metastatic rectal cancer EXAM: CT CHEST, ABDOMEN, AND PELVIS WITH CONTRAST TECHNIQUE: Multidetector CT imaging of the chest, abdomen and pelvis was performed following the standard protocol during bolus administration of intravenous contrast. RADIATION DOSE REDUCTION: This exam was performed according to the departmental dose-optimization program which includes automated exposure control, adjustment of the mA and/or kV according to patient size and/or use of iterative reconstruction technique. CONTRAST:  100 mL OMNIPAQUE IOHEXOL 300 MG/ML  SOLN COMPARISON:  CT chest abdomen pelvis dated 12/25/2022. PET-CT dated 09/19/2022. FINDINGS: CT CHEST FINDINGS Cardiovascular: The heart is normal in size. No pericardial effusion.  No evidence of thoracic aortic aneurysm. Atherosclerotic calcifications of the aortic arch. Mild  three-vessel coronary atherosclerosis. Right chest port terminates in the upper right atrium. Mediastinum/Nodes: No suspicious mediastinal lymphadenopathy. Visualized thyroid is unremarkable. Lungs/Pleura: Scattered pulmonary nodules/metastases, including a dominant bilobed nodule in the medial right middle lobe measuring 14 x 11 mm (series 4/image 94), previously 10 x 7 mm. Mild centrilobular and paraseptal emphysematous changes, upper lung predominant. No focal consolidation. No pleural effusion pneumothorax. Musculoskeletal: Mild degenerative changes of the thoracic spine. Stable deformity of the left inferior scapula (series 2/image 32). No focal osseous lesions. Proximal right humerus metastasis is not well visualized on the current study. CT ABDOMEN PELVIS FINDINGS Hepatobiliary: Subcapsular low-density in the anterior/inferior right hepatic lobe (series 2/image 68), chronic, non FDG avid on prior PET-CT, benign. No suspicious hepatic lesions. Status post cholecystectomy. Mild to moderate central intrahepatic ductal dilatation, possibly mildly progressive, but present on multiple priors and favored to be postsurgical. Dilated common duct, measuring 10 mm (coronal image 31), and smoothly tapering at the ampulla. Pancreas: Within normal limits. Spleen: 12 mm cyst in the posterior spleen (series 2/image 57), benign. Adrenals/Urinary Tract: Adrenal glands are normal limits. Bilateral renal cysts, measuring up to 5.9 cm in the right upper kidney (series 2/image 56) and 5.9 cm in the left lower kidney (series 2/image 76), benign (Bosniak I). No hydronephrosis. Bladder is within normal limits. Stomach/Bowel: Stomach is within normal limits. No evidence of bowel obstruction. Normal appendix (series 2/image 84). Status post abdominoperineal resection with mid transverse colostomy. Vascular/Lymphatic: No evidence of abdominal aortic aneurysm. Atherosclerotic calcifications of the abdominal aorta and branch vessels. 6 mm  short axis right external iliac node (series 2/image 105), previously 7 mm. 11 mm short axis right inguinal node (series 2/image 102), previously 10 mm. Reproductive: Status post hysterectomy with bilateral salpingo oophorectomy. Other: No abdominopelvic ascites. Postsurgical changes involving the anterior abdominal wall. Small fat containing parastomal hernia (series 2/image 81). 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 Degenerative changes of the visualized thoracolumbar spine. IMPRESSION: Status post abdominoperineal resection with mid transverse colostomy. Progressive pulmonary metastases, as above. Small right pelvic nodal metastases, grossly unchanged. Progressive left iliac bone metastasis, as above. Electronically Signed   By: Charline Bills M.D.   On: 03/26/2023 23:43

## 2023-05-24 ENCOUNTER — Encounter: Payer: Self-pay | Admitting: Oncology

## 2023-05-24 ENCOUNTER — Inpatient Hospital Stay: Payer: 59

## 2023-05-24 ENCOUNTER — Ambulatory Visit: Payer: 59

## 2023-05-24 DIAGNOSIS — K5900 Constipation, unspecified: Secondary | ICD-10-CM | POA: Diagnosis not present

## 2023-05-24 DIAGNOSIS — K219 Gastro-esophageal reflux disease without esophagitis: Secondary | ICD-10-CM | POA: Diagnosis not present

## 2023-05-24 DIAGNOSIS — C20 Malignant neoplasm of rectum: Secondary | ICD-10-CM | POA: Diagnosis not present

## 2023-05-24 DIAGNOSIS — D6481 Anemia due to antineoplastic chemotherapy: Secondary | ICD-10-CM | POA: Diagnosis not present

## 2023-05-24 DIAGNOSIS — N281 Cyst of kidney, acquired: Secondary | ICD-10-CM | POA: Diagnosis not present

## 2023-05-24 DIAGNOSIS — Z5112 Encounter for antineoplastic immunotherapy: Secondary | ICD-10-CM | POA: Diagnosis not present

## 2023-05-24 DIAGNOSIS — E876 Hypokalemia: Secondary | ICD-10-CM | POA: Diagnosis not present

## 2023-05-24 DIAGNOSIS — K435 Parastomal hernia without obstruction or  gangrene: Secondary | ICD-10-CM | POA: Diagnosis not present

## 2023-05-24 DIAGNOSIS — Z86711 Personal history of pulmonary embolism: Secondary | ICD-10-CM | POA: Diagnosis not present

## 2023-05-24 DIAGNOSIS — C7951 Secondary malignant neoplasm of bone: Secondary | ICD-10-CM | POA: Diagnosis not present

## 2023-05-24 DIAGNOSIS — K838 Other specified diseases of biliary tract: Secondary | ICD-10-CM | POA: Diagnosis not present

## 2023-05-24 DIAGNOSIS — R197 Diarrhea, unspecified: Secondary | ICD-10-CM | POA: Diagnosis not present

## 2023-05-24 DIAGNOSIS — R634 Abnormal weight loss: Secondary | ICD-10-CM | POA: Diagnosis not present

## 2023-05-24 DIAGNOSIS — I2510 Atherosclerotic heart disease of native coronary artery without angina pectoris: Secondary | ICD-10-CM | POA: Diagnosis not present

## 2023-05-24 DIAGNOSIS — Z87891 Personal history of nicotine dependence: Secondary | ICD-10-CM | POA: Diagnosis not present

## 2023-05-24 DIAGNOSIS — C799 Secondary malignant neoplasm of unspecified site: Secondary | ICD-10-CM

## 2023-05-24 DIAGNOSIS — Z79899 Other long term (current) drug therapy: Secondary | ICD-10-CM | POA: Diagnosis not present

## 2023-05-24 DIAGNOSIS — C7800 Secondary malignant neoplasm of unspecified lung: Secondary | ICD-10-CM | POA: Diagnosis not present

## 2023-05-24 DIAGNOSIS — I700 Atherosclerosis of aorta: Secondary | ICD-10-CM | POA: Diagnosis not present

## 2023-05-24 DIAGNOSIS — I10 Essential (primary) hypertension: Secondary | ICD-10-CM | POA: Diagnosis not present

## 2023-05-24 DIAGNOSIS — E7800 Pure hypercholesterolemia, unspecified: Secondary | ICD-10-CM | POA: Diagnosis not present

## 2023-05-24 DIAGNOSIS — Z7901 Long term (current) use of anticoagulants: Secondary | ICD-10-CM | POA: Diagnosis not present

## 2023-05-24 DIAGNOSIS — T451X5A Adverse effect of antineoplastic and immunosuppressive drugs, initial encounter: Secondary | ICD-10-CM | POA: Diagnosis not present

## 2023-05-24 DIAGNOSIS — R600 Localized edema: Secondary | ICD-10-CM | POA: Diagnosis not present

## 2023-05-24 MED ORDER — PEGFILGRASTIM INJECTION 6 MG/0.6ML ~~LOC~~
6.0000 mg | PREFILLED_SYRINGE | Freq: Once | SUBCUTANEOUS | Status: AC
  Administered 2023-05-24: 6 mg via SUBCUTANEOUS
  Filled 2023-05-24: qty 0.6

## 2023-05-24 MED ORDER — FILGRASTIM-SNDZ 480 MCG/0.8ML IJ SOSY: 480.0000 ug | PREFILLED_SYRINGE | Freq: Once | INTRAMUSCULAR | Status: DC

## 2023-06-04 MED FILL — Dexamethasone Sodium Phosphate Inj 100 MG/10ML: INTRAMUSCULAR | Qty: 1 | Status: AC

## 2023-06-05 ENCOUNTER — Inpatient Hospital Stay: Payer: 59

## 2023-06-05 ENCOUNTER — Encounter: Payer: Self-pay | Admitting: Oncology

## 2023-06-05 ENCOUNTER — Inpatient Hospital Stay: Payer: 59 | Admitting: Oncology

## 2023-06-05 VITALS — BP 126/62 | HR 78 | Temp 97.3°F | Resp 17

## 2023-06-05 VITALS — BP 134/64 | HR 80 | Temp 97.8°F | Resp 18 | Wt 163.6 lb

## 2023-06-05 DIAGNOSIS — C799 Secondary malignant neoplasm of unspecified site: Secondary | ICD-10-CM

## 2023-06-05 DIAGNOSIS — R634 Abnormal weight loss: Secondary | ICD-10-CM | POA: Diagnosis not present

## 2023-06-05 DIAGNOSIS — R197 Diarrhea, unspecified: Secondary | ICD-10-CM | POA: Diagnosis not present

## 2023-06-05 DIAGNOSIS — N281 Cyst of kidney, acquired: Secondary | ICD-10-CM | POA: Diagnosis not present

## 2023-06-05 DIAGNOSIS — E876 Hypokalemia: Secondary | ICD-10-CM

## 2023-06-05 DIAGNOSIS — D6481 Anemia due to antineoplastic chemotherapy: Secondary | ICD-10-CM | POA: Diagnosis not present

## 2023-06-05 DIAGNOSIS — Z7901 Long term (current) use of anticoagulants: Secondary | ICD-10-CM | POA: Diagnosis not present

## 2023-06-05 DIAGNOSIS — K435 Parastomal hernia without obstruction or  gangrene: Secondary | ICD-10-CM | POA: Diagnosis not present

## 2023-06-05 DIAGNOSIS — Z5112 Encounter for antineoplastic immunotherapy: Secondary | ICD-10-CM | POA: Diagnosis not present

## 2023-06-05 DIAGNOSIS — Z86711 Personal history of pulmonary embolism: Secondary | ICD-10-CM | POA: Diagnosis not present

## 2023-06-05 DIAGNOSIS — Z87891 Personal history of nicotine dependence: Secondary | ICD-10-CM | POA: Diagnosis not present

## 2023-06-05 DIAGNOSIS — C20 Malignant neoplasm of rectum: Secondary | ICD-10-CM

## 2023-06-05 DIAGNOSIS — Z5111 Encounter for antineoplastic chemotherapy: Secondary | ICD-10-CM

## 2023-06-05 DIAGNOSIS — K521 Toxic gastroenteritis and colitis: Secondary | ICD-10-CM | POA: Diagnosis not present

## 2023-06-05 DIAGNOSIS — T451X5A Adverse effect of antineoplastic and immunosuppressive drugs, initial encounter: Secondary | ICD-10-CM | POA: Diagnosis not present

## 2023-06-05 DIAGNOSIS — I10 Essential (primary) hypertension: Secondary | ICD-10-CM | POA: Diagnosis not present

## 2023-06-05 DIAGNOSIS — C7951 Secondary malignant neoplasm of bone: Secondary | ICD-10-CM | POA: Diagnosis not present

## 2023-06-05 DIAGNOSIS — I700 Atherosclerosis of aorta: Secondary | ICD-10-CM | POA: Diagnosis not present

## 2023-06-05 DIAGNOSIS — K838 Other specified diseases of biliary tract: Secondary | ICD-10-CM | POA: Diagnosis not present

## 2023-06-05 DIAGNOSIS — C7800 Secondary malignant neoplasm of unspecified lung: Secondary | ICD-10-CM | POA: Diagnosis not present

## 2023-06-05 DIAGNOSIS — D701 Agranulocytosis secondary to cancer chemotherapy: Secondary | ICD-10-CM

## 2023-06-05 DIAGNOSIS — R600 Localized edema: Secondary | ICD-10-CM | POA: Diagnosis not present

## 2023-06-05 DIAGNOSIS — E7800 Pure hypercholesterolemia, unspecified: Secondary | ICD-10-CM | POA: Diagnosis not present

## 2023-06-05 DIAGNOSIS — I2510 Atherosclerotic heart disease of native coronary artery without angina pectoris: Secondary | ICD-10-CM | POA: Diagnosis not present

## 2023-06-05 DIAGNOSIS — K5900 Constipation, unspecified: Secondary | ICD-10-CM | POA: Diagnosis not present

## 2023-06-05 DIAGNOSIS — K219 Gastro-esophageal reflux disease without esophagitis: Secondary | ICD-10-CM | POA: Diagnosis not present

## 2023-06-05 DIAGNOSIS — Z79899 Other long term (current) drug therapy: Secondary | ICD-10-CM | POA: Diagnosis not present

## 2023-06-05 LAB — CBC WITH DIFFERENTIAL/PLATELET
Abs Immature Granulocytes: 0.02 10*3/uL (ref 0.00–0.07)
Basophils Absolute: 0 10*3/uL (ref 0.0–0.1)
Basophils Relative: 1 %
Eosinophils Absolute: 0.2 10*3/uL (ref 0.0–0.5)
Eosinophils Relative: 2 %
HCT: 36.7 % (ref 36.0–46.0)
Hemoglobin: 11.6 g/dL — ABNORMAL LOW (ref 12.0–15.0)
Immature Granulocytes: 0 %
Lymphocytes Relative: 20 %
Lymphs Abs: 1.2 10*3/uL (ref 0.7–4.0)
MCH: 30.3 pg (ref 26.0–34.0)
MCHC: 31.6 g/dL (ref 30.0–36.0)
MCV: 95.8 fL (ref 80.0–100.0)
Monocytes Absolute: 0.4 10*3/uL (ref 0.1–1.0)
Monocytes Relative: 6 %
Neutro Abs: 4.3 10*3/uL (ref 1.7–7.7)
Neutrophils Relative %: 71 %
Platelets: 185 10*3/uL (ref 150–400)
RBC: 3.83 MIL/uL — ABNORMAL LOW (ref 3.87–5.11)
RDW: 16.3 % — ABNORMAL HIGH (ref 11.5–15.5)
WBC: 6.1 10*3/uL (ref 4.0–10.5)
nRBC: 0 % (ref 0.0–0.2)

## 2023-06-05 LAB — COMPREHENSIVE METABOLIC PANEL
ALT: 24 U/L (ref 0–44)
AST: 38 U/L (ref 15–41)
Albumin: 3.8 g/dL (ref 3.5–5.0)
Alkaline Phosphatase: 73 U/L (ref 38–126)
Anion gap: 6 (ref 5–15)
BUN: 14 mg/dL (ref 8–23)
CO2: 27 mmol/L (ref 22–32)
Calcium: 8.7 mg/dL — ABNORMAL LOW (ref 8.9–10.3)
Chloride: 103 mmol/L (ref 98–111)
Creatinine, Ser: 0.77 mg/dL (ref 0.44–1.00)
GFR, Estimated: >60 mL/min (ref >60)
Glucose, Bld: 111 mg/dL — ABNORMAL HIGH (ref 70–99)
Potassium: 3.3 mmol/L — ABNORMAL LOW (ref 3.5–5.1)
Sodium: 136 mmol/L (ref 135–145)
Total Bilirubin: 0.4 mg/dL (ref 0.3–1.2)
Total Protein: 6.7 g/dL (ref 6.5–8.1)

## 2023-06-05 LAB — MAGNESIUM: Magnesium: 1.4 mg/dL — ABNORMAL LOW (ref 1.7–2.4)

## 2023-06-05 MED ORDER — SODIUM CHLORIDE 0.9 % IV SOLN
Freq: Once | INTRAVENOUS | Status: AC
  Filled 2023-06-05: qty 250

## 2023-06-05 MED ORDER — SODIUM CHLORIDE 0.9 % IV SOLN
6.0000 mg/kg | Freq: Once | INTRAVENOUS | Status: AC
  Administered 2023-06-05: 400 mg via INTRAVENOUS
  Filled 2023-06-05: qty 20

## 2023-06-05 MED ORDER — DIPHENHYDRAMINE HCL 50 MG/ML IJ SOLN
25.0000 mg | Freq: Once | INTRAMUSCULAR | Status: AC
  Administered 2023-06-05: 25 mg via INTRAVENOUS
  Filled 2023-06-05: qty 1

## 2023-06-05 MED ORDER — SODIUM CHLORIDE 0.9 % IV SOLN
100.0000 mg/m2 | Freq: Once | INTRAVENOUS | Status: AC
  Administered 2023-06-05: 180 mg via INTRAVENOUS
  Filled 2023-06-05: qty 4

## 2023-06-05 MED ORDER — HEPARIN SOD (PORK) LOCK FLUSH 100 UNIT/ML IV SOLN
500.0000 [IU] | Freq: Once | INTRAVENOUS | Status: AC | PRN
  Administered 2023-06-05: 500 [IU]
  Filled 2023-06-05: qty 5

## 2023-06-05 MED ORDER — SODIUM CHLORIDE 0.9 % IV SOLN
10.0000 mg | Freq: Once | INTRAVENOUS | Status: AC
  Administered 2023-06-05: 10 mg via INTRAVENOUS
  Filled 2023-06-05: qty 10

## 2023-06-05 MED ORDER — MAGNESIUM SULFATE 4 GM/100ML IV SOLN
4.0000 g | Freq: Every day | INTRAVENOUS | Status: DC | PRN
  Administered 2023-06-05: 4 g via INTRAVENOUS
  Filled 2023-06-05: qty 100

## 2023-06-05 MED ORDER — PALONOSETRON HCL INJECTION 0.25 MG/5ML
0.2500 mg | Freq: Once | INTRAVENOUS | Status: AC
  Administered 2023-06-05: 0.25 mg via INTRAVENOUS
  Filled 2023-06-05: qty 5

## 2023-06-05 MED ORDER — POTASSIUM CHLORIDE 20 MEQ/100ML IV SOLN
20.0000 meq | Freq: Once | INTRAVENOUS | Status: AC
  Administered 2023-06-05: 20 meq via INTRAVENOUS

## 2023-06-05 MED ORDER — ATROPINE SULFATE 1 MG/ML IV SOLN
0.5000 mg | Freq: Once | INTRAVENOUS | Status: AC
  Administered 2023-06-05: 0.5 mg via INTRAVENOUS
  Filled 2023-06-05: qty 1

## 2023-06-05 NOTE — Assessment & Plan Note (Signed)
 continue lomotil  QID PRN as instructed.

## 2023-06-05 NOTE — Assessment & Plan Note (Signed)
patient will receive long-acting GCSF prophylaxis on D3 

## 2023-06-05 NOTE — Assessment & Plan Note (Signed)
Continue potassium chloride 20 mEq daily.  K is 3.3, recommend IV KCL x 1

## 2023-06-05 NOTE — Assessment & Plan Note (Addendum)
History of stage IIIC Rectal cancer, s/p TNT, followed by 09/17/19 APR/posterior vaginectomy/TAH/BSO/VY-flap, pT4b pN0 with close vaginal margin 0.2 mm.  Uterus and ovaries negative for malignancy. palliative radiation to vaginal recurrence- 01/19/21 recurrence with lung metastasis.-Palliative -FOLFIRI plus bevacizumab.  Irinotecan was dropped in November 2022 due to side effects. Negative for UGT1A1*28 - radiographically stable, rise of CEA-July 2023 CT lung metastasis worse--> Dec 2023 PET showed progression in pelvic lymph nodes and bone lesions,--> 2nd line irinotecan +panitumumab-->CT March 2024 stable. --> June 2024 CT showed mild lung progression/Progressive left iliac bone metastasis   Labs are reviewed and discussed with patient, CEA nadir was in 490, currently gradually increasing, 1402, she asymptomatic.  Proceed with irinotecan +panitumumab  repeat CT in Sept 2024

## 2023-06-05 NOTE — Progress Notes (Signed)
Hematology/Oncology Progress note [phone] [phone]      CHIEF COMPLAINTS/REASON FOR VISIT:  Follow up for rectal cancer  ASSESSMENT & PLAN:   Cancer Staging  Rectal cancer [facility]) Staging form: Colon and Rectum, AJCC 8th Edition - Pathologic stage from 10/06/2019: Stage IIC (ypT4b, pN0, cM0) - Signed by [name], MD on 10/06/2019 - Clinical stage from 06/30/2020: Jacqueline Yoder - Signed by [name], MD on 04/17/2022 - Pathologic: Stage Unknown (rpTX, pNX, cM1) - Signed by [name], MD on 01/31/2021   Rectal cancer [facility]) History of stage IIIC Rectal cancer, s/p TNT, followed by 09/17/19 APR/posterior vaginectomy/TAH/BSO/VY-flap, pT4b pN0 with close vaginal margin 0.2 mm.  Uterus and ovaries negative for malignancy. palliative radiation to vaginal recurrence- 01/19/21 recurrence with lung metastasis.-Palliative -FOLFIRI plus bevacizumab.  Irinotecan was dropped in November 2022 due to side effects. Negative for UGT1A1*28 - radiographically stable, rise of CEA-July 2023 CT lung metastasis worse--> Dec 2023 PET showed progression in pelvic lymph nodes and bone lesions,--> 2nd line irinotecan +panitumumab-->CT March 2024 stable. --> June 2024 CT showed mild lung progression/Progressive left iliac bone metastasis   Labs are reviewed and discussed with patient, CEA nadir was in 490, currently gradually increasing, 1402, she asymptomatic.  Proceed with irinotecan +panitumumab  repeat CT in Sept 2024  Encounter for antineoplastic chemotherapy Chemotherapy plan as listed above.   Anemia due to antineoplastic chemotherapy Hb stable observation  Chemotherapy induced neutropenia (HCC) patient will receive long-acting GCSF prophylaxis on D3  History of pulmonary embolism History of PE and bilateral lower extremity DVT  05/18/23 Repeat right LE US showed chronic non occlusive DVT Continue  Eliquis 2.5 mg twice daily for anticoagulation prophylaxis. Recommend leg elevation  and compression stocking.   Chemotherapy induced diarrhea continue lomotil  QID PRN as instructed.   Hypokalemia Continue potassium chloride 20 mEq daily.  K is 3.3, recommend IV KCL x 1  Hypomagnesemia If magnesium is 1.0 - 1.2:  administrate 6 gm IV magnesium sulfate If magnesium is 1.2-1.4, administrate 4 gm IV magnesium sulfate If magnesium is 1.5-1.6:  administrate 2 gm IV magnesium sulfate.  Continue slow mag orally 64mg  twice daily.    Follow up 2 weeks Lab MD irinotecan + panitumumab All questions were answered. The patient knows to call the clinic with any problems, questions or concerns.  [name], MD, PhD [facility] Hematology Oncology 06/05/2023      HISTORY OF PRESENTING ILLNESS:  Patient initially presented with complaints of postmenopausal bleeding on 08/16/2018.  History of was menopausal vaginal bleeding in 2016 which resulted in cervical polypectomy.  Pathology 02/04/2015 showed cervical polyp, consistent with benign endometrial polyp.  [name] after polypectomy due to anxiety associated with pelvic exams.  pelvic exam on 08/16/2018 reviewed cervical abnormality and from enlarged uterus. Seen by Dr. [name] on 10/29/2018.  Endometrial biopsy and a Pap smear was performed. 10/29/2018 Pap smear showed adenocarcinoma, favor endometrial origin. 10/29/2018 endometrial biopsy showed endometrioid carcinoma, FIGO grade 1.  10/29/2018- TA & TV Ultrasound revealed: Anteverted uterus measuring 8.7 x 5.6 x 6.4 cm without evidence of focal masses.  The endometrium measuring 24.1 mm (thickened) and heterogeneous.  Right and left ovaries not visualized.  No adnexal masses identified.  No free fluid in cul-de-sac.  Patient was seen by Dr. [name] in clinic on 11/13/2018.  Cervical exam reveals 2 cm exophytic irregular mass consistent with malignancy.   11/19/2018 CT chest abdomen pelvis with contrast showed thickened endometrium with some irregularity  compatible with  the provided diagnosis of endometrial malignancy.  There is a mildly prominent left inguinal node 1.4 cm.  Patient was seen by Dr. [name] on 11/20/2018 and left groin lymph node biopsy was recommended.  11/26/2018 patient underwent left inguinal lymph node biopsy. Pathology showed metastatic adenocarcinoma consistent with colorectal origin.  CDX 2+.  Case was discussed on tumor board.  Recommend colonoscopy for further evaluation.  Patient reports significant weight loss 30 pounds over the last year.  Chronic vaginal spotting. Change of bowel habits the past few months.  More constipated.  Family history positive for brother who has colon cancer prostate cancer.  patient has underwent colonoscopy on 12/03/2018 which reviewed a nonobstructing large mass in the rectum.  Also chronic fistula.  Mass was not circumferential.  This was biopsied with a cold forceps for histology.  Pathology came back hyperplastic polyp negative for dysplasia and malignancy. [name], patient underwent flex sigmoidoscopy on 12/06/2018 with rebiopsy of the rectal mass. This time biopsy results came back positive for invasive colorectal adenocarcinoma, moderately differentiated. Immunotherapy for nearly mismatch repair protein (MMR ) was performed.  There is no loss of MMR expression.  low probability of MSI high.   # Seen by [name] for evaluation of resectability for rectal cancer. In addition, she also had a second opinion with [facility] pathology where her endometrial biopsy pathology was changed to  adenocarcinoma, consistent with colorectal primary.   [name]. She has home health that has been assisting with ostomy care  Patient was also evaluated by [facility] oncology.  Recommendation is to proceed with TNT with concurrent chemoradiation followed by neoadjuvant chemotherapy followed by surgical resection. Patient prefers to have treatment done locally with  [facility].   # Oncology Treatment:  02/03/2019- 03/19/2019  concurrent Xeloda and radiation.  Xeloda dose 825mg  /m2 BID - rounded to 1650mg  BID- on days of radiation. 04/09/2019, started on FOLFOX with bolus early.  Omitted.  07/16/2019 finished 8 cycles of FOLFOX. 09/17/19 APR/posterior vaginectomy/TAH/BSO/VY-flap pT4b pN0 with close vaginal margin 0.2 mm.  Uterus and ovaries negative for malignancy. Patient reports bilateral lower extremity numbness and tingling, intermittent, left worse than right. She has lost a lot of weight since her APR surgery.   #Family history with half brother having's history of colon cancer prostate cancer.  Personal history of colorectal cancer.  Patient has not decided if she wants genetic testing.    # history of PE( 01/13/2020)  in the bilateral lower extremity DVT (01/13/2020).   She finishes 6 months of anticoagulation with Eliquis 5 mg twice daily. Now switched to Eliquis 2.5 mg twice daily..  # She has now developed recurrent disease. #06/30/20  vaginal introitus mass biopsied. Pathology is consistent with metastatic colorectal adenocarcinoma I have discussed with [name]  Dr. Luciano Cutter and the mass is not resectable. Patient has also had colonoscopy by Dr. Tobi Bastos yesterday. Normal examination. # 07/16/2020 cycle 1 FOLFIRI  # 07/20/2020 PET scan was done for further evaluation, images are consistent with local recurrence, no distant metastasis. #Discussed with radiation oncology Dr. Rushie Chestnut will recommends concurrent chemotherapy and radiation. 08/02/2020-08/16/2020, patient starts radiation.  Xeloda was held due to neutropenia 08/17/2020,-09/06/2020 Xeloda 1500 mg twice daily concurrently with radiation  01/31/21 started on FOLFIRI + Bev 05/18/2021 CT chest abdomen pelvis showed Previously noted enlargement of bilateral inguinal lymph nodes is resolved, consistent with treatment response of nodal metastatic disease. Interval decrease in size of multiple small bilateral  pulmonary nodules, consistent with treatment response of pulmonary metastatic disease. No evidence of new metastatic disease. 05/24/2021 - 08/30/2021, continued on FOLFIRI plus bevacizumab.  Irinotecan dose was reduced, eventually 100mg /m2  09/02/2021, CT chest abdomen pelvis without contrast Showed small bilateral pulmonary nodules, unchanged.  Stable metastatic disease.  No noncontrast evidence of new metastatic disease in the chest abdomen pelvis.  Small parastomal hernia.  Enlargement of main pulmonary artery.  Coronary artery disease.  09/13/2021, maintenance 5-FU/bevacizumab 11/28/2021, 5-FU/Irinotecan/bevacizumab.  Irinotecan 100 mg/m2 was added back due to progressively increasing CEA.  -right lower extremity edema, US showed non occlusive DVT, edema has improved.   INTERVAL HISTORY Jacqueline Yoder is a 77 y.o. female who has above history reviewed by me presents for follow-up of rectal cancer. On Irinotecan and panitumumab. she tolerates well.   She was accompanied by her daughter. - chronic diarrhea is manageable.  She take Lomotil PRN, usually 1-2 time per day.  -she denies any additional episode of vaginal spotting    Review of Systems  Constitutional:  Positive for fatigue. Negative for appetite change, chills, fever and unexpected weight change.  HENT:   Negative for hearing loss and voice change.   Eyes:  Negative for eye problems.  Respiratory:  Negative for chest tightness and cough.   Cardiovascular:  Negative for chest pain.  Gastrointestinal:  Positive for diarrhea. Negative for abdominal distention, abdominal pain, blood in stool, constipation and nausea.  Endocrine: Negative for hot flashes.  Genitourinary:  Negative for difficulty urinating and frequency.   Musculoskeletal:  Positive for arthralgias.  Skin:  Negative for itching.  Neurological:  Negative for extremity weakness and numbness.  Hematological:  Negative for adenopathy.  Psychiatric/Behavioral:   Negative for confusion.     MEDICAL HISTORY:  Past Medical History:  Diagnosis Date   Allergy    Arthritis    Blood clot in vein    Family history of colon cancer    GERD (gastroesophageal reflux disease)    Hypercholesteremia    Hypertension    Hypertension    Lower extremity edema    Personal history of chemotherapy    Rectal cancer (HCC) 12/2018   Urinary incontinence     SURGICAL HISTORY: Past Surgical History:  Procedure Laterality Date   ABDOMINAL HYSTERECTOMY     CHOLECYSTECTOMY  1971   COLONOSCOPY WITH PROPOFOL N/A 12/03/2018   Procedure: COLONOSCOPY WITH PROPOFOL;  Surgeon: Midge Minium, MD;  Location: [facility] ENDOSCOPY;  Service: Endoscopy;  Laterality: N/A;   COLONOSCOPY WITH PROPOFOL N/A 07/15/2020   Procedure: COLONOSCOPY WITH PROPOFOL;  Surgeon: Wyline Mood, MD;  Location: [facility] ENDOSCOPY;  Service: Gastroenterology;  Laterality: N/A;   FLEXIBLE SIGMOIDOSCOPY N/A 12/06/2018   Procedure: FLEXIBLE SIGMOIDOSCOPY;  Surgeon: Wyline Mood, MD;  Location: [facility] ENDOSCOPY;  Service: Endoscopy;  Laterality: N/A;   LAPAROSCOPIC COLOSTOMY  01/06/2019   PORTACATH PLACEMENT N/A 04/03/2019   Procedure: INSERTION PORT-A-CATH;  Surgeon: Leafy Ro, MD;  Location: [facility] ORS;  Service: General;  Laterality: N/A;    SOCIAL HISTORY: Social History   Socioeconomic History   Marital status: Widowed    Spouse name: Not on file   Number of children: Not on file   Years of education: Not on file   Highest education level: Not on file  Occupational History   Not on file  Tobacco Use   Smoking status: Former    Current packs/day: 0.00    Types: Cigarettes    Quit date: 12/02/1977    Years since  quitting: 45.5   Smokeless tobacco: Former  Building services engineer status: Never Used  Substance and Sexual Activity   Alcohol use: Never   Drug use: Never   Sexual activity: Not Currently    Birth control/protection: None  Other Topics Concern   Not on file  Social History Narrative    Lives with daughter   Social Determinants of Health   Financial Resource Strain: Medium Risk (06/30/2022)   Overall Financial Resource Strain (CARDIA)    Difficulty of Paying Living Expenses: Somewhat hard  Food Insecurity: No Food Insecurity (06/30/2022)   Hunger Vital Sign    Worried About Running Out of Food in the Last Year: Never true    Ran Out of Food in the Last Year: Never true  Transportation Needs: No Transportation Needs (06/30/2022)   PRAPARE - Administrator, Civil Service (Medical): No    Lack of Transportation (Non-Medical): No  Physical Activity: Inactive (06/30/2022)   Exercise Vital Sign    Days of Exercise per Week: 0 days    Minutes of Exercise per Session: 0 min  Stress: No Stress Concern Present (06/30/2022)   [employer] of Occupational Health - Occupational Stress Questionnaire    Feeling of Stress : Not at all  Social Connections: Moderately Isolated (06/30/2022)   Social Connection and Isolation Panel [NHANES]    Frequency of Communication with Friends and Family: More than three times a week    Frequency of Social Gatherings with Friends and Family: Twice a week    Attends Religious Services: More than 4 times per year    Active Member of [employer] or Organizations: No    Attends Banker Meetings: Never    Marital Status: Widowed  Intimate Partner Violence: Not At Risk (06/30/2022)   Humiliation, Afraid, Rape, and Kick questionnaire    Fear of Current or Ex-Partner: No    Emotionally Abused: No    Physically Abused: No    Sexually Abused: No    FAMILY HISTORY: Family History  Problem Relation Age of Onset   Colon cancer Brother 110       exposure to chemicals [place]   Hypertension Mother    Stroke Mother    Kidney failure Father    Breast cancer Neg Hx    Ovarian cancer Neg Hx     ALLERGIES:  is allergic to sulfamethoxazole-trimethoprim.  MEDICATIONS:  Current Outpatient Medications  Medication Sig Dispense Refill    Cholecalciferol (VITAMIN D3) 2000 units capsule Take 2,000 Units by mouth daily.     clindamycin (CLINDAGEL) 1 % gel Apply topically 2 (two) times daily. 30 g 5   diclofenac sodium (VOLTAREN) 1 % GEL Apply 2 g topically 4 (four) times daily as needed (joint pain).  11   diphenoxylate-atropine (LOMOTIL) 2.5-0.025 MG tablet Take 1 tablet by mouth 4 (four) times daily as needed for diarrhea or loose stools. 120 tablet 0   ELIQUIS 2.5 MG TABS tablet TAKE 1 TABLET BY MOUTH TWICE  DAILY 200 tablet 2   fluticasone (FLONASE) 50 MCG/ACT nasal spray USE 1 SPRAY IN EACH NOSTRIL ONCE DAILY 16 g 3   ipratropium (ATROVENT) 0.03 % nasal spray Place 1 spray into both nostrils 2 (two) times daily.     ketoconazole (NIZORAL) 2 % cream Apply 1 Application topically daily. For up to 2 weeks. May repeat if need. 30 g 2   lidocaine-prilocaine (EMLA) cream Apply 1 application. topically as needed. 30 g 6  loperamide (IMODIUM) 2 MG capsule Take 1 capsule (2 mg total) by mouth See admin instructions. With onset of loose stool, take 4mg  followed by 2mg  every 2 hours,  Maximum: 16 mg/day 120 capsule 1   loratadine (CLARITIN) 10 MG tablet Take 10 mg by mouth daily.     magnesium chloride (SLOW-MAG) 64 MG TBEC SR tablet Take 1 tablet (64 mg total) by mouth 2 (two) times daily. 60 tablet 2   Multiple Vitamins-Minerals (ONE-A-DAY WOMENS 50 PLUS PO) Take 1 tablet by mouth daily.      nystatin (MYCOSTATIN/NYSTOP) powder APPLY 1 POWDER TOPICALLY THREE TIMES DAILY 45 g 0   potassium chloride SA (KLOR-CON M) 20 MEQ tablet TAKE 1 TABLET BY MOUTH DAILY 90 tablet 1   simvastatin (ZOCOR) 40 MG tablet Take 1 tablet (40 mg total) by mouth at bedtime. 100 tablet 3   triamcinolone cream (KENALOG) 0.5 % Apply 1 Application topically 2 (two) times daily. To affected areas, for up to 2 weeks. 30 g 2   triamterene-hydrochlorothiazide (DYAZIDE) 37.5-25 MG capsule Take 1 each (1 capsule total) by mouth daily. 100 capsule 3   zinc gluconate 50 MG  tablet Take 50 mg by mouth daily.     No current facility-administered medications for this visit.   Facility-Administered Medications Ordered in Other Visits  Medication Dose Route Frequency Provider Last Rate Last Admin   atropine injection 0.5 mg  0.5 mg Intravenous Once [name], MD       heparin lock flush 100 UNIT/ML injection            irinotecan (CAMPTOSAR) 180 mg in sodium chloride 0.9 % 500 mL chemo infusion  100 mg/m2 (Treatment Plan Recorded) Intravenous Once [name], MD       magnesium sulfate IVPB 4 g 100 mL  4 g Intravenous Daily PRN [name], MD       panitumumab (VECTIBIX) 400 mg in sodium chloride 0.9 % 100 mL chemo infusion  6 mg/kg (Order-Specific) Intravenous Once [name], MD       potassium chloride 20 mEq in 100 mL IVPB  20 mEq Intravenous Once [name], MD 100 mL/hr at 06/05/23 0937 20 mEq at 06/05/23 1610     PHYSICAL EXAMINATION: ECOG PERFORMANCE STATUS: 1 - Symptomatic but completely ambulatory  Physical Exam Constitutional:      General: She is not in acute distress. HENT:     Head: Normocephalic and atraumatic.  Eyes:     General: No scleral icterus. Cardiovascular:     Rate and Rhythm: Normal rate.     Heart sounds: Normal heart sounds.  Pulmonary:     Effort: Pulmonary effort is normal. No respiratory distress.     Breath sounds: No wheezing.  Abdominal:     General: Bowel sounds are normal. There is no distension.     Palpations: Abdomen is soft.     Comments: + Colostomy bag   Musculoskeletal:        General: No deformity. Normal range of motion.     Cervical back: Normal range of motion and neck supple.  Skin:    General: Skin is warm and dry.  Neurological:     Mental Status: She is alert and oriented to person, place, and time. Mental status is at baseline.     Cranial Nerves: No cranial nerve deficit.     Coordination: Coordination normal.       LABORATORY DATA:  I have reviewed the data as listed  Latest Ref Rng & Units  06/05/2023    8:34 AM 05/22/2023    8:35 AM 05/08/2023    8:15 AM  CBC  WBC 4.0 - 10.5 K/uL 6.1  5.0  4.4   Hemoglobin 12.0 - 15.0 g/dL 16.1  09.6  04.5   Hematocrit 36.0 - 46.0 % 36.7  36.8  35.9   Platelets 150 - 400 K/uL 185  176  203       Latest Ref Rng & Units 06/05/2023    8:34 AM 05/22/2023    8:35 AM 05/08/2023    8:15 AM  CMP  Glucose 70 - 99 mg/dL 409  95  98   BUN 8 - 23 mg/dL 14  13  14    Creatinine 0.44 - 1.00 mg/dL 8.11  9.14  7.82   Sodium 135 - 145 mmol/L 136  138  138   Potassium 3.5 - 5.1 mmol/L 3.3  3.5  3.7   Chloride 98 - 111 mmol/L 103  103  105   CO2 22 - 32 mmol/L 27  26  28    Calcium 8.9 - 10.3 mg/dL 8.7  8.7  8.7   Total Protein 6.5 - 8.1 g/dL 6.7  6.6  6.4   Total Bilirubin 0.3 - 1.2 mg/dL 0.4  0.5  0.6   Alkaline Phos 38 - 126 U/L 73  79  72   AST 15 - 41 U/L 38  32  39   ALT 0 - 44 U/L 24  23  27       RADIOGRAPHIC STUDIES: I have personally reviewed the radiological images as listed and agreed with the findings in the report. US Venous Img Lower Unilateral Right  Result Date: 05/18/2023 CLINICAL DATA:  Right lower extremity swelling EXAM: RIGHT LOWER EXTREMITY VENOUS DOPPLER ULTRASOUND TECHNIQUE: Gray-scale sonography with compression, as well as color and duplex ultrasound, were performed to evaluate the deep venous system(s) from the level of the common femoral vein through the popliteal and proximal calf veins. COMPARISON:  01/13/2020 FINDINGS: VENOUS Small amount of nonocclusive thrombus noted in the right posterior tibial vein. The thrombus is echogenic and eccentric which suggests that it is chronic. Otherwise normal compressibility of the common femoral, superficial femoral, and popliteal veins, as well as the visualized calf veins. Visualized portions of profunda femoral vein and great saphenous vein unremarkable. No additional filling defects to suggest DVT on grayscale or color Doppler imaging. Doppler waveforms show normal direction of venous flow,  normal respiratory plasticity and response to augmentation. Limited views of the contralateral common femoral vein are unremarkable. OTHER 3.9 x 1.1 x 1.2 cm fluid collection in the right popliteal fossa likely Baker's cyst. Limitations: none IMPRESSION: Small amount of chronic nonocclusive DVT noted in the right posterior tibial vein. Electronically Signed   By: Acquanetta Belling M.D.   On: 05/18/2023 14:36   CT CHEST ABDOMEN PELVIS W CONTRAST  Result Date: 03/26/2023 CLINICAL DATA:  Follow-up metastatic rectal cancer EXAM: CT CHEST, ABDOMEN, AND PELVIS WITH CONTRAST TECHNIQUE: Multidetector CT imaging of the chest, abdomen and pelvis was performed following the standard protocol during bolus administration of intravenous contrast. RADIATION DOSE REDUCTION: This exam was performed according to the departmental dose-optimization program which includes automated exposure control, adjustment of the mA and/or kV according to patient size and/or use of iterative reconstruction technique. CONTRAST:  100 mL OMNIPAQUE IOHEXOL 300 MG/ML  SOLN COMPARISON:  CT chest abdomen pelvis dated 12/25/2022. PET-CT dated 09/19/2022. FINDINGS: CT CHEST FINDINGS Cardiovascular: The  heart is normal in size. No pericardial effusion. No evidence of thoracic aortic aneurysm. Atherosclerotic calcifications of the aortic arch. Mild three-vessel coronary atherosclerosis. Right chest port terminates in the upper right atrium. Mediastinum/Nodes: No suspicious mediastinal lymphadenopathy. Visualized thyroid is unremarkable. Lungs/Pleura: Scattered pulmonary nodules/metastases, including a dominant bilobed nodule in the medial right middle lobe measuring 14 x 11 mm (series 4/image 94), previously 10 x 7 mm. Mild centrilobular and paraseptal emphysematous changes, upper lung predominant. No focal consolidation. No pleural effusion pneumothorax. Musculoskeletal: Mild degenerative changes of the thoracic spine. Stable deformity of the left inferior  scapula (series 2/image 32). No focal osseous lesions. Proximal right humerus metastasis is not well visualized on the current study. CT ABDOMEN PELVIS FINDINGS Hepatobiliary: Subcapsular low-density in the anterior/inferior right hepatic lobe (series 2/image 68), chronic, non FDG avid on prior PET-CT, benign. No suspicious hepatic lesions. Status post cholecystectomy. Mild to moderate central intrahepatic ductal dilatation, possibly mildly progressive, but present on multiple priors and favored to be postsurgical. Dilated common duct, measuring 10 mm (coronal image 31), and smoothly tapering at the ampulla. Pancreas: Within normal limits. Spleen: 12 mm cyst in the posterior spleen (series 2/image 57), benign. Adrenals/Urinary Tract: Adrenal glands are normal limits. Bilateral renal cysts, measuring up to 5.9 cm in the right upper kidney (series 2/image 56) and 5.9 cm in the left lower kidney (series 2/image 76), benign (Bosniak I). No hydronephrosis. Bladder is within normal limits. Stomach/Bowel: Stomach is within normal limits. No evidence of bowel obstruction. Normal appendix (series 2/image 84). Status post abdominoperineal resection with mid transverse colostomy. Vascular/Lymphatic: No evidence of abdominal aortic aneurysm. Atherosclerotic calcifications of the abdominal aorta and branch vessels. 6 mm short axis right external iliac node (series 2/image 105), previously 7 mm. 11 mm short axis right inguinal node (series 2/image 102), previously 10 mm. Reproductive: Status post hysterectomy with bilateral salpingo oophorectomy. Other: No abdominopelvic ascites. Postsurgical changes involving the anterior abdominal wall. Small fat containing parastomal hernia (series 2/image 81). 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 Degenerative changes of the visualized thoracolumbar spine. IMPRESSION: Status post abdominoperineal resection with mid  transverse colostomy. Progressive pulmonary metastases, as above. Small right pelvic nodal metastases, grossly unchanged. Progressive left iliac bone metastasis, as above. Electronically Signed   By: Charline Bills M.D.   On: 03/26/2023 23:43      US Venous Img Lower Unilateral Right  Result Date: 05/18/2023 CLINICAL DATA:  Right lower extremity swelling EXAM: RIGHT LOWER EXTREMITY VENOUS DOPPLER ULTRASOUND TECHNIQUE: Gray-scale sonography with compression, as well as color and duplex ultrasound, were performed to evaluate the deep venous system(s) from the level of the common femoral vein through the popliteal and proximal calf veins. COMPARISON:  01/13/2020 FINDINGS: VENOUS Small amount of nonocclusive thrombus noted in the right posterior tibial vein. The thrombus is echogenic and eccentric which suggests that it is chronic. Otherwise normal compressibility of the common femoral, superficial femoral, and popliteal veins, as well as the visualized calf veins. Visualized portions of profunda femoral vein and great saphenous vein unremarkable. No additional filling defects to suggest DVT on grayscale or color Doppler imaging. Doppler waveforms show normal direction of venous flow, normal respiratory plasticity and response to augmentation. Limited views of the contralateral common femoral vein are unremarkable. OTHER 3.9 x 1.1 x 1.2 cm fluid collection in the right popliteal fossa likely Baker's cyst. Limitations: none IMPRESSION: Small amount of chronic nonocclusive DVT noted in the right posterior tibial vein. Electronically  Signed   By: Acquanetta Belling M.D.   On: 05/18/2023 14:36   CT CHEST ABDOMEN PELVIS W CONTRAST  Result Date: 03/26/2023 CLINICAL DATA:  Follow-up metastatic rectal cancer EXAM: CT CHEST, ABDOMEN, AND PELVIS WITH CONTRAST TECHNIQUE: Multidetector CT imaging of the chest, abdomen and pelvis was performed following the standard protocol during bolus administration of intravenous contrast.  RADIATION DOSE REDUCTION: This exam was performed according to the departmental dose-optimization program which includes automated exposure control, adjustment of the mA and/or kV according to patient size and/or use of iterative reconstruction technique. CONTRAST:  100 mL OMNIPAQUE IOHEXOL 300 MG/ML  SOLN COMPARISON:  CT chest abdomen pelvis dated 12/25/2022. PET-CT dated 09/19/2022. FINDINGS: CT CHEST FINDINGS Cardiovascular: The heart is normal in size. No pericardial effusion. No evidence of thoracic aortic aneurysm. Atherosclerotic calcifications of the aortic arch. Mild three-vessel coronary atherosclerosis. Right chest port terminates in the upper right atrium. Mediastinum/Nodes: No suspicious mediastinal lymphadenopathy. Visualized thyroid is unremarkable. Lungs/Pleura: Scattered pulmonary nodules/metastases, including a dominant bilobed nodule in the medial right middle lobe measuring 14 x 11 mm (series 4/image 94), previously 10 x 7 mm. Mild centrilobular and paraseptal emphysematous changes, upper lung predominant. No focal consolidation. No pleural effusion pneumothorax. Musculoskeletal: Mild degenerative changes of the thoracic spine. Stable deformity of the left inferior scapula (series 2/image 32). No focal osseous lesions. Proximal right humerus metastasis is not well visualized on the current study. CT ABDOMEN PELVIS FINDINGS Hepatobiliary: Subcapsular low-density in the anterior/inferior right hepatic lobe (series 2/image 68), chronic, non FDG avid on prior PET-CT, benign. No suspicious hepatic lesions. Status post cholecystectomy. Mild to moderate central intrahepatic ductal dilatation, possibly mildly progressive, but present on multiple priors and favored to be postsurgical. Dilated common duct, measuring 10 mm (coronal image 31), and smoothly tapering at the ampulla. Pancreas: Within normal limits. Spleen: 12 mm cyst in the posterior spleen (series 2/image 57), benign. Adrenals/Urinary Tract:  Adrenal glands are normal limits. Bilateral renal cysts, measuring up to 5.9 cm in the right upper kidney (series 2/image 56) and 5.9 cm in the left lower kidney (series 2/image 76), benign (Bosniak I). No hydronephrosis. Bladder is within normal limits. Stomach/Bowel: Stomach is within normal limits. No evidence of bowel obstruction. Normal appendix (series 2/image 84). Status post abdominoperineal resection with mid transverse colostomy. Vascular/Lymphatic: No evidence of abdominal aortic aneurysm. Atherosclerotic calcifications of the abdominal aorta and branch vessels. 6 mm short axis right external iliac node (series 2/image 105), previously 7 mm. 11 mm short axis right inguinal node (series 2/image 102), previously 10 mm. Reproductive: Status post hysterectomy with bilateral salpingo oophorectomy. Other: No abdominopelvic ascites. Postsurgical changes involving the anterior abdominal wall. Small fat containing parastomal hernia (series 2/image 81). 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 Degenerative changes of the visualized thoracolumbar spine. IMPRESSION: Status post abdominoperineal resection with mid transverse colostomy. Progressive pulmonary metastases, as above. Small right pelvic nodal metastases, grossly unchanged. Progressive left iliac bone metastasis, as above. Electronically Signed   By: Charline Bills M.D.   On: 03/26/2023 23:43

## 2023-06-05 NOTE — Assessment & Plan Note (Signed)
If magnesium is 1.0 - 1.2:  administrate 6 gm IV magnesium sulfate If magnesium is 1.2-1.4, administrate 4 gm IV magnesium sulfate If magnesium is 1.5-1.6:  administrate 2 gm IV magnesium sulfate.  Continue slow mag orally 64mg twice daily.  

## 2023-06-05 NOTE — Patient Instructions (Signed)
[facility]  Discharge Instructions: Thank you for choosing [facility] to provide your oncology and hematology care.  If you have a lab appointment with the Cancer Center, please go directly to the Cancer Center and check in at the registration area.  Wear comfortable clothing and clothing appropriate for easy access to any Portacath or PICC line.   We strive to give you quality time with your provider. You may need to reschedule your appointment if you arrive late (15 or more minutes).  Arriving late affects you and other patients whose appointments are after yours.  Also, if you miss three or more appointments without notifying the office, you may be dismissed from the clinic at the provider's discretion.      For prescription refill requests, have your pharmacy contact our office and allow 72 hours for refills to be completed.    Today you received the following chemotherapy and/or immunotherapy agents vectibix and irinotecan      To help prevent nausea and vomiting after your treatment, we encourage you to take your nausea medication as directed.  BELOW ARE SYMPTOMS THAT SHOULD BE REPORTED IMMEDIATELY: *FEVER GREATER THAN 100.4 F (38 C) OR HIGHER *CHILLS OR SWEATING *NAUSEA AND VOMITING THAT IS NOT CONTROLLED WITH YOUR NAUSEA MEDICATION *UNUSUAL SHORTNESS OF BREATH *UNUSUAL BRUISING OR BLEEDING *URINARY PROBLEMS (pain or burning when urinating, or frequent urination) *BOWEL PROBLEMS (unusual diarrhea, constipation, pain near the anus) TENDERNESS IN MOUTH AND THROAT WITH OR WITHOUT PRESENCE OF ULCERS (sore throat, sores in mouth, or a toothache) UNUSUAL RASH, SWELLING OR PAIN  UNUSUAL VAGINAL DISCHARGE OR ITCHING   Items with * indicate a potential emergency and should be followed up as soon as possible or go to the Emergency Department if any problems should occur.  Please show the CHEMOTHERAPY ALERT CARD or IMMUNOTHERAPY ALERT CARD  at check-in to the Emergency Department and triage nurse.  Should you have questions after your visit or need to cancel or reschedule your appointment, please contact [facility]  [phone] and follow the prompts.  Office hours are 8:00 a.m. to 4:30 p.m. Monday - Friday. Please note that voicemails left after 4:00 p.m. may not be returned until the following business day.  We are closed weekends and major holidays. You have access to a nurse at all times for urgent questions. Please call the main number to the clinic [phone] and follow the prompts.  For any non-urgent questions, you may also contact your provider using MyChart. We now offer e-Visits for anyone 89 and older to request care online for non-urgent symptoms. For details visit [email or web address].   Also download the MyChart app! Go to the app store, search "MyChart", open the app, select [facility], and log in with your MyChart username and password.

## 2023-06-05 NOTE — Assessment & Plan Note (Signed)
Hb stable observation 

## 2023-06-05 NOTE — Assessment & Plan Note (Signed)
Chemotherapy plan as listed above 

## 2023-06-05 NOTE — Assessment & Plan Note (Signed)
 History of PE and bilateral lower extremity DVT  05/18/23 Repeat right LE US showed chronic non occlusive DVT Continue  Eliquis 2.5 mg twice daily for anticoagulation prophylaxis. Recommend leg elevation and compression stocking.

## 2023-06-06 ENCOUNTER — Ambulatory Visit: Payer: Self-pay | Admitting: *Deleted

## 2023-06-06 LAB — CEA: CEA: 1597 ng/mL — ABNORMAL HIGH (ref 0.0–4.7)

## 2023-06-06 NOTE — Telephone Encounter (Signed)
[employer]-nurse reached out to patient and has some concerns about patient symptoms:  Possible rectal tear-rectal bleeding.( Patient has rectal cancer and currently going through chemotherapy)  Attempted to call patient- no answer- left message to call office

## 2023-06-06 NOTE — Telephone Encounter (Signed)
Second attempt to call patient- no answer- left message to call office.

## 2023-06-07 ENCOUNTER — Other Ambulatory Visit: Payer: Self-pay | Admitting: Oncology

## 2023-06-07 ENCOUNTER — Inpatient Hospital Stay: Payer: 59

## 2023-06-07 DIAGNOSIS — Z5112 Encounter for antineoplastic immunotherapy: Secondary | ICD-10-CM | POA: Diagnosis not present

## 2023-06-07 DIAGNOSIS — I10 Essential (primary) hypertension: Secondary | ICD-10-CM | POA: Diagnosis not present

## 2023-06-07 DIAGNOSIS — Z79899 Other long term (current) drug therapy: Secondary | ICD-10-CM | POA: Diagnosis not present

## 2023-06-07 DIAGNOSIS — C20 Malignant neoplasm of rectum: Secondary | ICD-10-CM

## 2023-06-07 DIAGNOSIS — Z7901 Long term (current) use of anticoagulants: Secondary | ICD-10-CM | POA: Diagnosis not present

## 2023-06-07 DIAGNOSIS — T451X5A Adverse effect of antineoplastic and immunosuppressive drugs, initial encounter: Secondary | ICD-10-CM | POA: Diagnosis not present

## 2023-06-07 DIAGNOSIS — I700 Atherosclerosis of aorta: Secondary | ICD-10-CM | POA: Diagnosis not present

## 2023-06-07 DIAGNOSIS — K5900 Constipation, unspecified: Secondary | ICD-10-CM | POA: Diagnosis not present

## 2023-06-07 DIAGNOSIS — E7800 Pure hypercholesterolemia, unspecified: Secondary | ICD-10-CM | POA: Diagnosis not present

## 2023-06-07 DIAGNOSIS — C7800 Secondary malignant neoplasm of unspecified lung: Secondary | ICD-10-CM | POA: Diagnosis not present

## 2023-06-07 DIAGNOSIS — Z87891 Personal history of nicotine dependence: Secondary | ICD-10-CM | POA: Diagnosis not present

## 2023-06-07 DIAGNOSIS — R634 Abnormal weight loss: Secondary | ICD-10-CM | POA: Diagnosis not present

## 2023-06-07 DIAGNOSIS — Z86711 Personal history of pulmonary embolism: Secondary | ICD-10-CM | POA: Diagnosis not present

## 2023-06-07 DIAGNOSIS — K435 Parastomal hernia without obstruction or  gangrene: Secondary | ICD-10-CM | POA: Diagnosis not present

## 2023-06-07 DIAGNOSIS — R600 Localized edema: Secondary | ICD-10-CM | POA: Diagnosis not present

## 2023-06-07 DIAGNOSIS — C799 Secondary malignant neoplasm of unspecified site: Secondary | ICD-10-CM

## 2023-06-07 DIAGNOSIS — E876 Hypokalemia: Secondary | ICD-10-CM | POA: Diagnosis not present

## 2023-06-07 DIAGNOSIS — K838 Other specified diseases of biliary tract: Secondary | ICD-10-CM | POA: Diagnosis not present

## 2023-06-07 DIAGNOSIS — R197 Diarrhea, unspecified: Secondary | ICD-10-CM | POA: Diagnosis not present

## 2023-06-07 DIAGNOSIS — D6481 Anemia due to antineoplastic chemotherapy: Secondary | ICD-10-CM | POA: Diagnosis not present

## 2023-06-07 DIAGNOSIS — C7951 Secondary malignant neoplasm of bone: Secondary | ICD-10-CM | POA: Diagnosis not present

## 2023-06-07 DIAGNOSIS — K219 Gastro-esophageal reflux disease without esophagitis: Secondary | ICD-10-CM | POA: Diagnosis not present

## 2023-06-07 DIAGNOSIS — N281 Cyst of kidney, acquired: Secondary | ICD-10-CM | POA: Diagnosis not present

## 2023-06-07 DIAGNOSIS — I2510 Atherosclerotic heart disease of native coronary artery without angina pectoris: Secondary | ICD-10-CM | POA: Diagnosis not present

## 2023-06-07 MED ORDER — PEGFILGRASTIM INJECTION 6 MG/0.6ML ~~LOC~~
6.0000 mg | PREFILLED_SYRINGE | Freq: Once | SUBCUTANEOUS | Status: AC
  Administered 2023-06-07: 6 mg via SUBCUTANEOUS
  Filled 2023-06-07: qty 0.6

## 2023-06-07 NOTE — Telephone Encounter (Signed)
Dr Cathie Hoops, can you review this patient concern as well? I would assume if rectal tear pathology is present - I would typically involve general surgery. Would she be able to follow up with a symptom management visit acutely with your office or we can try to more urgently refer to Gen surgery. I don't see more triage information and my next apt with her is not for another few weeks.  Saralyn Pilar, DO [facility] 06/07/2023, 8:12 AM

## 2023-06-08 ENCOUNTER — Encounter: Payer: Self-pay | Admitting: Oncology

## 2023-06-08 NOTE — Telephone Encounter (Signed)
  Chief Complaint: small amount rectal spotting Symptoms: patient reports she may have irritated the rectum just by movements and cleaning- she reports she has seem several spots of blood on tissue when cleaning herself. She does not have BM so she thinks this is skin irritation.  Frequency: several times within the last week- small amount- but patient is taking blood thinner Pertinent Negatives: Patient denies any other symptoms related to the rectum Disposition: [] ED /[] Urgent Care (no appt availability in office) / [x] Appointment(In office/virtual)/ []  [facility]/ [] Home Care/ [] Refused Recommended Disposition /[] [facility]/ []  Follow-up with PCP Additional Notes: Patient thinks her rectal bleeding is related to weakened area from past surgery and she has irritated the area. Patient state sshe may need nurse to come out to monitor- advised she will need to be evaluated first- appointment made.

## 2023-06-08 NOTE — Telephone Encounter (Signed)
Reason for Disposition . [1] Rectal bleeding is minimal (e.g., blood just on toilet paper, few drops, streaks on surface of normal formed BM) AND [2] bleeding recurs 3 or more times on treatment  Answer Assessment - Initial Assessment Questions 1. APPEARANCE of BLOOD: "What color is it?" "Is it passed separately, on the surface of the stool, or mixed in with the stool?"      Right red- no BM- patient has "bag" 2. AMOUNT: "How much blood was passed?"      Specks- spots- not a lot, saw when wiping 3. FREQUENCY: "How many times has blood been passed with the stools?"      na 4. ONSET: "When was the blood first seen in the stools?" (Days or weeks)      1 week ago 5. DIARRHEA: "Is there also some diarrhea?" If Yes, ask: "How many diarrhea stools in the past 24 hours?"      na 6. CONSTIPATION: "Do you have constipation?" If Yes, ask: "How bad is it?"     na 7. RECURRENT SYMPTOMS: "Have you had blood in your stools before?" If Yes, ask: "When was the last time?" and "What happened that time?"      Hx of rectal surgery, possible little tear at rectum 8. BLOOD THINNERS: "Do you take any blood thinners?" (e.g., Coumadin/warfarin, Pradaxa/dabigatran, aspirin)     yes 9. OTHER SYMPTOMS: "Do you have any other symptoms?"  (e.g., abdomen pain, vomiting, dizziness, fever)     no  Protocols used: Rectal Bleeding-A-AH

## 2023-06-11 ENCOUNTER — Ambulatory Visit: Payer: 59 | Admitting: Family Medicine

## 2023-06-11 ENCOUNTER — Encounter: Payer: Self-pay | Admitting: Family Medicine

## 2023-06-11 VITALS — BP 120/60 | HR 89 | Resp 18 | Ht 64.0 in | Wt 160.0 lb

## 2023-06-11 DIAGNOSIS — L89151 Pressure ulcer of sacral region, stage 1: Secondary | ICD-10-CM | POA: Diagnosis not present

## 2023-06-11 DIAGNOSIS — R531 Weakness: Secondary | ICD-10-CM

## 2023-06-11 DIAGNOSIS — B372 Candidiasis of skin and nail: Secondary | ICD-10-CM

## 2023-06-11 NOTE — Patient Instructions (Addendum)
Thank you for coming to the office today.  Referral to home health [facility].  Keep on powder for skin fold  Please schedule a Follow-up Appointment to: Return if symptoms worsen or fail to improve.  If you have any other questions or concerns, please feel free to call the office or send a message through MyChart. You may also schedule an earlier appointment if necessary.  Additionally, you may be receiving a survey about your experience at our office within a few days to 1 week by e-mail or mail. We value your feedback.  Saralyn Pilar, DO [facility], [place]

## 2023-06-11 NOTE — Progress Notes (Signed)
Subjective:    Patient ID: Jacqueline Yoder, female    DOB: 1946/05/06, 77 y.o.   MRN: [id number]  Jacqueline Yoder is a 77 y.o. female presenting on 06/11/2023 for pressure sore   HPI  Candidal Intertrigo R groin / abdomen within skin fold some red irritated inflamed skin. Has used anti fungal powder regularly with good results.  Gluteal / Sacral Pressure Sore Stage 1 Reports open sore but no drainage or issues. 1 x 1 cm superficial base of gluteal cleft. No bleeding or complication Has barrier cream intact  Requesting return to [facility] for wound care  Vaginal Abnormality / Bleeding Followed by [facility] Has seen Dr [name] 02/22/23. They were concerned about possible metastatic disease to vulvar/clitoral area, and she is on chemotherapy for metastatic adenocarcinoma. She referred to GYN other location and may pursue biopsy in future She has occasional blood with wiping at times. But not every day.       06/11/2023    2:47 PM 01/03/2023    9:25 AM 06/30/2022    9:45 AM  Depression screen PHQ 2/9  Decreased Interest 0 0 0  Down, Depressed, Hopeless 0 0 1  PHQ - 2 Score 0 0 1  Altered sleeping 0 0 0  Tired, decreased energy 0 0 0  Change in appetite 0 0 0  Feeling bad or failure about yourself  0 0 0  Trouble concentrating 0 0 0  Moving slowly or fidgety/restless 0 0 0  Suicidal thoughts 0 0 0  PHQ-9 Score 0 0 1  Difficult doing work/chores Not difficult at all Not difficult at all Not difficult at all    Social History   Tobacco Use   Smoking status: Former    Current packs/day: 0.00    Types: Cigarettes    Quit date: 12/02/1977    Years since quitting: 45.5   Smokeless tobacco: Former  Building services engineer status: Never Used  Substance Use Topics   Alcohol use: Never   Drug use: Never    Review of Systems Per HPI unless specifically indicated above     Objective:    BP 120/60 (BP Location: Left Arm, Patient Position: Sitting, Cuff Size:  Normal)   Pulse 89   Resp 18   Ht 5\' 4"  (1.626 m)   Wt 160 lb (72.6 kg)   SpO2 96%   BMI 27.46 kg/m   Wt Readings from Last 3 Encounters:  06/11/23 160 lb (72.6 kg)  06/05/23 163 lb 9.6 oz (74.2 kg)  05/22/23 162 lb 8 oz (73.7 kg)    Physical Exam Vitals and nursing note reviewed.  Constitutional:      General: She is not in acute distress.    Appearance: Normal appearance. She is well-developed. She is not diaphoretic.     Comments: Well-appearing, comfortable, cooperative  HENT:     Head: Normocephalic and atraumatic.  Eyes:     General:        Right eye: No discharge.        Left eye: No discharge.     Conjunctiva/sclera: Conjunctivae normal.  Cardiovascular:     Rate and Rhythm: Normal rate.  Pulmonary:     Effort: Pulmonary effort is normal.  Skin:    General: Skin is warm and dry.     Findings: Lesion (1 x 1 cm wound in gluteal / sacral region) present. No erythema or rash.  Neurological:     Mental Status: She  is alert and oriented to person, place, and time.  Psychiatric:        Mood and Affect: Mood normal.        Behavior: Behavior normal.        Thought Content: Thought content normal.     Comments: Well groomed, good eye contact, normal speech and thoughts    Results for orders placed or performed in visit on 06/05/23  Magnesium  Result Value Ref Range   Magnesium 1.4 (L) 1.7 - 2.4 mg/dL  Comprehensive metabolic panel  Result Value Ref Range   Sodium 136 135 - 145 mmol/L   Potassium 3.3 (L) 3.5 - 5.1 mmol/L   Chloride 103 98 - 111 mmol/L   CO2 27 22 - 32 mmol/L   Glucose, Bld 111 (H) 70 - 99 mg/dL   BUN 14 8 - 23 mg/dL   Creatinine, Ser 5.40 0.44 - 1.00 mg/dL   Calcium 8.7 (L) 8.9 - 10.3 mg/dL   Total Protein 6.7 6.5 - 8.1 g/dL   Albumin 3.8 3.5 - 5.0 g/dL   AST 38 15 - 41 U/L   ALT 24 0 - 44 U/L   Alkaline Phosphatase 73 38 - 126 U/L   Total Bilirubin 0.4 0.3 - 1.2 mg/dL   GFR, Estimated >98 >11 mL/min   Anion gap 6 5 - 15  CBC with  Differential  Result Value Ref Range   WBC 6.1 4.0 - 10.5 K/uL   RBC 3.83 (L) 3.87 - 5.11 MIL/uL   Hemoglobin 11.6 (L) 12.0 - 15.0 g/dL   HCT 91.4 78.2 - 95.6 %   MCV 95.8 80.0 - 100.0 fL   MCH 30.3 26.0 - 34.0 pg   MCHC 31.6 30.0 - 36.0 g/dL   RDW 21.3 (H) 08.6 - 57.8 %   Platelets 185 150 - 400 K/uL   nRBC 0.0 0.0 - 0.2 %   Neutrophils Relative % 71 %   Neutro Abs 4.3 1.7 - 7.7 K/uL   Lymphocytes Relative 20 %   Lymphs Abs 1.2 0.7 - 4.0 K/uL   Monocytes Relative 6 %   Monocytes Absolute 0.4 0.1 - 1.0 K/uL   Eosinophils Relative 2 %   Eosinophils Absolute 0.2 0.0 - 0.5 K/uL   Basophils Relative 1 %   Basophils Absolute 0.0 0.0 - 0.1 K/uL   Immature Granulocytes 0 %   Abs Immature Granulocytes 0.02 0.00 - 0.07 K/uL  CEA  Result Value Ref Range   CEA 1,597.0 (H) 0.0 - 4.7 ng/mL   *Note: Due to a large number of results and/or encounters for the requested time period, some results have not been displayed. A complete set of results can be found in Results Review.      Assessment & Plan:   Problem List Items Addressed This Visit   None Visit Diagnoses     Pressure injury of sacral region, stage 1    -  Primary   Relevant Orders   Ambulatory referral to Home Health   Candidal intertrigo       Generalized weakness           Candidal intertrigo Continue Nystatin powder May use topical cortisone as needed for redness inflammation  Gluteal / Sacral stage 1 ulceration sore Superficial, difficult to reach area, has had prior sacral wounds before Improved w/ wound care in the past Recommend topical barrier cream and management to avoid direct pressure  Refer to [facility]  Orders Placed This Encounter  Procedures   Ambulatory referral to Home Health    Referral Priority:   Routine    Referral Type:   Home Health Care    Referral Reason:   Specialty Services Required    Requested Specialty:   Home Health Services    Number of Visits Requested:   1      No orders of the defined types were placed in this encounter.     Follow up plan: Return if symptoms worsen or fail to improve.  Saralyn Pilar, DO [facility] 06/11/2023, 2:57 PM

## 2023-06-14 DIAGNOSIS — Z556 Problems related to health literacy: Secondary | ICD-10-CM | POA: Diagnosis not present

## 2023-06-14 DIAGNOSIS — Z87891 Personal history of nicotine dependence: Secondary | ICD-10-CM | POA: Diagnosis not present

## 2023-06-14 DIAGNOSIS — I10 Essential (primary) hypertension: Secondary | ICD-10-CM | POA: Diagnosis not present

## 2023-06-14 DIAGNOSIS — Z7901 Long term (current) use of anticoagulants: Secondary | ICD-10-CM | POA: Diagnosis not present

## 2023-06-14 DIAGNOSIS — B372 Candidiasis of skin and nail: Secondary | ICD-10-CM | POA: Diagnosis not present

## 2023-06-14 DIAGNOSIS — L89152 Pressure ulcer of sacral region, stage 2: Secondary | ICD-10-CM | POA: Diagnosis not present

## 2023-06-14 DIAGNOSIS — Z79899 Other long term (current) drug therapy: Secondary | ICD-10-CM | POA: Diagnosis not present

## 2023-06-15 ENCOUNTER — Telehealth: Payer: Self-pay | Admitting: Family Medicine

## 2023-06-15 NOTE — Telephone Encounter (Signed)
Home Health Verbal Orders - Caller/Agency: [facility] Callback Number: [phone] Requesting /Skilled Nursing/For wound care to her sacrum Frequency: 2 wk 9

## 2023-06-15 NOTE — Telephone Encounter (Signed)
Okay for verbal orders as requested? 

## 2023-06-15 NOTE — Telephone Encounter (Signed)
Notified Elnita Maxwell of approval of orders requested.

## 2023-06-19 ENCOUNTER — Encounter: Payer: Self-pay | Admitting: Oncology

## 2023-06-19 ENCOUNTER — Inpatient Hospital Stay: Payer: 59 | Attending: Oncology

## 2023-06-19 ENCOUNTER — Inpatient Hospital Stay (HOSPITAL_BASED_OUTPATIENT_CLINIC_OR_DEPARTMENT_OTHER): Payer: 59 | Admitting: Oncology

## 2023-06-19 ENCOUNTER — Telehealth: Payer: Self-pay

## 2023-06-19 ENCOUNTER — Inpatient Hospital Stay: Payer: 59

## 2023-06-19 DIAGNOSIS — R197 Diarrhea, unspecified: Secondary | ICD-10-CM | POA: Insufficient documentation

## 2023-06-19 DIAGNOSIS — D701 Agranulocytosis secondary to cancer chemotherapy: Secondary | ICD-10-CM | POA: Diagnosis not present

## 2023-06-19 DIAGNOSIS — C799 Secondary malignant neoplasm of unspecified site: Secondary | ICD-10-CM

## 2023-06-19 DIAGNOSIS — Z86711 Personal history of pulmonary embolism: Secondary | ICD-10-CM

## 2023-06-19 DIAGNOSIS — D6481 Anemia due to antineoplastic chemotherapy: Secondary | ICD-10-CM | POA: Diagnosis not present

## 2023-06-19 DIAGNOSIS — C20 Malignant neoplasm of rectum: Secondary | ICD-10-CM

## 2023-06-19 DIAGNOSIS — T451X5A Adverse effect of antineoplastic and immunosuppressive drugs, initial encounter: Secondary | ICD-10-CM | POA: Insufficient documentation

## 2023-06-19 DIAGNOSIS — Z5111 Encounter for antineoplastic chemotherapy: Secondary | ICD-10-CM | POA: Insufficient documentation

## 2023-06-19 DIAGNOSIS — C7800 Secondary malignant neoplasm of unspecified lung: Secondary | ICD-10-CM | POA: Diagnosis not present

## 2023-06-19 DIAGNOSIS — E876 Hypokalemia: Secondary | ICD-10-CM | POA: Insufficient documentation

## 2023-06-19 DIAGNOSIS — Z5189 Encounter for other specified aftercare: Secondary | ICD-10-CM | POA: Diagnosis not present

## 2023-06-19 DIAGNOSIS — N939 Abnormal uterine and vaginal bleeding, unspecified: Secondary | ICD-10-CM

## 2023-06-19 DIAGNOSIS — C7951 Secondary malignant neoplasm of bone: Secondary | ICD-10-CM | POA: Diagnosis not present

## 2023-06-19 DIAGNOSIS — K521 Toxic gastroenteritis and colitis: Secondary | ICD-10-CM

## 2023-06-19 LAB — COMPREHENSIVE METABOLIC PANEL
ALT: 25 U/L (ref 0–44)
AST: 37 U/L (ref 15–41)
Albumin: 3.7 g/dL (ref 3.5–5.0)
Alkaline Phosphatase: 75 U/L (ref 38–126)
Anion gap: 7 (ref 5–15)
BUN: 12 mg/dL (ref 8–23)
CO2: 26 mmol/L (ref 22–32)
Calcium: 8.7 mg/dL — ABNORMAL LOW (ref 8.9–10.3)
Chloride: 105 mmol/L (ref 98–111)
Creatinine, Ser: 0.75 mg/dL (ref 0.44–1.00)
GFR, Estimated: >60 mL/min (ref >60)
Glucose, Bld: 111 mg/dL — ABNORMAL HIGH (ref 70–99)
Potassium: 3.7 mmol/L (ref 3.5–5.1)
Sodium: 138 mmol/L (ref 135–145)
Total Bilirubin: 0.3 mg/dL (ref 0.3–1.2)
Total Protein: 6.3 g/dL — ABNORMAL LOW (ref 6.5–8.1)

## 2023-06-19 LAB — CBC WITH DIFFERENTIAL/PLATELET
Abs Immature Granulocytes: 0.02 10*3/uL (ref 0.00–0.07)
Basophils Absolute: 0 10*3/uL (ref 0.0–0.1)
Basophils Relative: 0 %
Eosinophils Absolute: 0.1 10*3/uL (ref 0.0–0.5)
Eosinophils Relative: 1 %
HCT: 36.4 % (ref 36.0–46.0)
Hemoglobin: 11.5 g/dL — ABNORMAL LOW (ref 12.0–15.0)
Immature Granulocytes: 0 %
Lymphocytes Relative: 19 %
Lymphs Abs: 1.4 10*3/uL (ref 0.7–4.0)
MCH: 30.7 pg (ref 26.0–34.0)
MCHC: 31.6 g/dL (ref 30.0–36.0)
MCV: 97.1 fL (ref 80.0–100.0)
Monocytes Absolute: 0.6 10*3/uL (ref 0.1–1.0)
Monocytes Relative: 8 %
Neutro Abs: 5.1 10*3/uL (ref 1.7–7.7)
Neutrophils Relative %: 72 %
Platelets: 199 10*3/uL (ref 150–400)
RBC: 3.75 MIL/uL — ABNORMAL LOW (ref 3.87–5.11)
RDW: 16.1 % — ABNORMAL HIGH (ref 11.5–15.5)
WBC: 7.2 10*3/uL (ref 4.0–10.5)
nRBC: 0 % (ref 0.0–0.2)

## 2023-06-19 LAB — MAGNESIUM: Magnesium: 1.4 mg/dL — ABNORMAL LOW (ref 1.7–2.4)

## 2023-06-19 MED ORDER — HEPARIN SOD (PORK) LOCK FLUSH 100 UNIT/ML IV SOLN
500.0000 [IU] | Freq: Once | INTRAVENOUS | Status: DC | PRN
  Filled 2023-06-19: qty 5

## 2023-06-19 MED ORDER — ATROPINE SULFATE 1 MG/ML IV SOLN
0.5000 mg | Freq: Once | INTRAVENOUS | Status: AC
  Administered 2023-06-19: 0.5 mg via INTRAVENOUS
  Filled 2023-06-19: qty 1

## 2023-06-19 MED ORDER — DIPHENHYDRAMINE HCL 50 MG/ML IJ SOLN
25.0000 mg | Freq: Once | INTRAMUSCULAR | Status: AC
  Administered 2023-06-19: 25 mg via INTRAVENOUS
  Filled 2023-06-19: qty 1

## 2023-06-19 MED ORDER — SODIUM CHLORIDE 0.9 % IV SOLN
100.0000 mg/m2 | Freq: Once | INTRAVENOUS | Status: AC
  Administered 2023-06-19: 180 mg via INTRAVENOUS
  Filled 2023-06-19: qty 9

## 2023-06-19 MED ORDER — MAGNESIUM SULFATE 4 GM/100ML IV SOLN
4.0000 g | Freq: Every day | INTRAVENOUS | Status: DC | PRN
  Administered 2023-06-19: 4 g via INTRAVENOUS
  Filled 2023-06-19: qty 100

## 2023-06-19 MED ORDER — SODIUM CHLORIDE 0.9 % IV SOLN
10.0000 mg | Freq: Once | INTRAVENOUS | Status: AC
  Administered 2023-06-19: 10 mg via INTRAVENOUS
  Filled 2023-06-19: qty 10

## 2023-06-19 MED ORDER — SODIUM CHLORIDE 0.9 % IV SOLN
Freq: Once | INTRAVENOUS | Status: AC
  Filled 2023-06-19: qty 250

## 2023-06-19 MED ORDER — SODIUM CHLORIDE 0.9 % IV SOLN
6.0000 mg/kg | Freq: Once | INTRAVENOUS | Status: AC
  Administered 2023-06-19: 400 mg via INTRAVENOUS
  Filled 2023-06-19: qty 20

## 2023-06-19 MED ORDER — PALONOSETRON HCL INJECTION 0.25 MG/5ML
0.2500 mg | Freq: Once | INTRAVENOUS | Status: AC
  Administered 2023-06-19: 0.25 mg via INTRAVENOUS
  Filled 2023-06-19: qty 5

## 2023-06-19 NOTE — Assessment & Plan Note (Signed)
 continue lomotil  QID PRN as instructed.

## 2023-06-19 NOTE — Assessment & Plan Note (Signed)
Hb stable observation 

## 2023-06-19 NOTE — Assessment & Plan Note (Signed)
Continue potassium chloride 20 mEq daily.

## 2023-06-19 NOTE — Assessment & Plan Note (Signed)
If magnesium is 1.0 - 1.2:  administrate 6 gm IV magnesium sulfate If magnesium is 1.2-1.4, administrate 4 gm IV magnesium sulfate If magnesium is 1.5-1.6:  administrate 2 gm IV magnesium sulfate.  Continue slow mag orally 64mg twice daily.  

## 2023-06-19 NOTE — Progress Notes (Signed)
Hematology/Oncology Progress note [phone] [phone]      CHIEF COMPLAINTS/REASON FOR VISIT:  Follow up for rectal cancer  ASSESSMENT & PLAN:   Cancer Staging  Rectal cancer [facility]) Staging form: Colon and Rectum, AJCC 8th Edition - Pathologic stage from 10/06/2019: Stage IIC (ypT4b, pN0, cM0) - Signed by [name], MD on 10/06/2019 - Clinical stage from 06/30/2020: Jacqueline Yoder - Signed by [name], MD on 04/17/2022 - Pathologic: Stage Unknown (rpTX, pNX, cM1) - Signed by [name], MD on 01/31/2021   Rectal cancer [facility]) History of stage IIIC Rectal cancer, s/p TNT, followed by 09/17/19 APR/posterior vaginectomy/TAH/BSO/VY-flap, pT4b pN0 with close vaginal margin 0.2 mm.  Uterus and ovaries negative for malignancy. palliative radiation to vaginal recurrence- 01/19/21 recurrence with lung metastasis.-Palliative -FOLFIRI plus bevacizumab.  Irinotecan was dropped in November 2022 due to side effects. Negative for UGT1A1*28 - radiographically stable, rise of CEA-July 2023 CT lung metastasis worse--> Dec 2023 PET showed progression in pelvic lymph nodes and bone lesions,--> 2nd line irinotecan +panitumumab-->CT March 2024 stable. --> June 2024 CT showed mild lung progression/Progressive left iliac bone metastasis    Labs are reviewed and discussed with patient, CEA nadir was in 490, currently gradually increasing, 1402, she asymptomatic.  Proceed with irinotecan +panitumumab  repeat CT in Sept 2024 Obtain Tempus Liquid biopsy   Hypomagnesemia If magnesium is 1.0 - 1.2:  administrate 6 gm IV magnesium sulfate If magnesium is 1.2-1.4, administrate 4 gm IV magnesium sulfate If magnesium is 1.5-1.6:  administrate 2 gm IV magnesium sulfate.  Continue slow mag orally 64mg  twice daily.   Encounter for antineoplastic chemotherapy Chemotherapy plan as listed above.   Anemia due to antineoplastic chemotherapy Hb stable observation  Chemotherapy induced neutropenia  (HCC) patient will receive long-acting GCSF prophylaxis on D3  Vaginal spotting likely recurrent rectal cancer involving vagina. Discussed with GynOnc She has another episode of spottin, recommend palliative radiation.  Vulvar mass location is difficult for biopsy in the clinic per gynonc.  Patient is willing to further discuss with Gynonc regarding biopsy under anaesthesia, if no other better biopsy sites on CT  History of pulmonary embolism History of PE and bilateral lower extremity DVT  05/18/23 Repeat right LE US showed chronic non occlusive DVT Continue  Eliquis 2.5 mg twice daily for anticoagulation prophylaxis. Recommend leg elevation and compression stocking.   Chemotherapy induced diarrhea continue lomotil  QID PRN as instructed.   Hypokalemia Continue potassium chloride 20 mEq daily.     Follow up 2 weeks Lab MD irinotecan + panitumumab All questions were answered. The patient knows to call the clinic with any problems, questions or concerns.  [name], MD, PhD [facility] Hematology Oncology 06/19/2023      HISTORY OF PRESENTING ILLNESS:  Patient initially presented with complaints of postmenopausal bleeding on 08/16/2018.  History of was menopausal vaginal bleeding in 2016 which resulted in cervical polypectomy.  Pathology 02/04/2015 showed cervical polyp, consistent with benign endometrial polyp.  [name] after polypectomy due to anxiety associated with pelvic exams.  pelvic exam on 08/16/2018 reviewed cervical abnormality and from enlarged uterus. Seen by Dr. [name] on 10/29/2018.  Endometrial biopsy and a Pap smear was performed. 10/29/2018 Pap smear showed adenocarcinoma, favor endometrial origin. 10/29/2018 endometrial biopsy showed endometrioid carcinoma, FIGO grade 1.  10/29/2018- TA & TV Ultrasound revealed: Anteverted uterus measuring 8.7 x 5.6 x 6.4 cm without evidence of focal masses.  The endometrium measuring 24.1 mm (thickened) and  heterogeneous.  Right  and left ovaries not visualized.  No adnexal masses identified.  No free fluid in cul-de-sac.  Patient was seen by Dr. [name] in clinic on 11/13/2018.  Cervical exam reveals 2 cm exophytic irregular mass consistent with malignancy.   11/19/2018 CT chest abdomen pelvis with contrast showed thickened endometrium with some irregularity compatible with the provided diagnosis of endometrial malignancy.  There is a mildly prominent left inguinal node 1.4 cm.  Patient was seen by Dr. [name] on 11/20/2018 and left groin lymph node biopsy was recommended.  11/26/2018 patient underwent left inguinal lymph node biopsy. Pathology showed metastatic adenocarcinoma consistent with colorectal origin.  CDX 2+.  Case was discussed on tumor board.  Recommend colonoscopy for further evaluation.  Patient reports significant weight loss 30 pounds over the last year.  Chronic vaginal spotting. Change of bowel habits the past few months.  More constipated.  Family history positive for brother who has colon cancer prostate cancer.  patient has underwent colonoscopy on 12/03/2018 which reviewed a nonobstructing large mass in the rectum.  Also chronic fistula.  Mass was not circumferential.  This was biopsied with a cold forceps for histology.  Pathology came back hyperplastic polyp negative for dysplasia and malignancy. [name], patient underwent flex sigmoidoscopy on 12/06/2018 with rebiopsy of the rectal mass. This time biopsy results came back positive for invasive colorectal adenocarcinoma, moderately differentiated. Immunotherapy for nearly mismatch repair protein (MMR ) was performed.  There is no loss of MMR expression.  low probability of MSI high.   # Seen by [name] for evaluation of resectability for rectal cancer. In addition, she also had a second opinion with [facility] pathology where her endometrial biopsy pathology was changed to  adenocarcinoma,  consistent with colorectal primary.   [name]. She has home health that has been assisting with ostomy care  Patient was also evaluated by [facility] oncology.  Recommendation is to proceed with TNT with concurrent chemoradiation followed by neoadjuvant chemotherapy followed by surgical resection. Patient prefers to have treatment done locally with [facility].   # Oncology Treatment:  02/03/2019- 03/19/2019  concurrent Xeloda and radiation.  Xeloda dose 825mg  /m2 BID - rounded to 1650mg  BID- on days of radiation. 04/09/2019, started on FOLFOX with bolus early.  Omitted.  07/16/2019 finished 8 cycles of FOLFOX. 09/17/19 APR/posterior vaginectomy/TAH/BSO/VY-flap pT4b pN0 with close vaginal margin 0.2 mm.  Uterus and ovaries negative for malignancy. Patient reports bilateral lower extremity numbness and tingling, intermittent, left worse than right. She has lost a lot of weight since her APR surgery.   #Family history with half brother having's history of colon cancer prostate cancer.  Personal history of colorectal cancer.  Patient has not decided if she wants genetic testing.    # history of PE( 01/13/2020)  in the bilateral lower extremity DVT (01/13/2020).   She finishes 6 months of anticoagulation with Eliquis 5 mg twice daily. Now switched to Eliquis 2.5 mg twice daily..  # She has now developed recurrent disease. #06/30/20  vaginal introitus mass biopsied. Pathology is consistent with metastatic colorectal adenocarcinoma I have discussed with [name]  Dr. Luciano Cutter and the mass is not resectable. Patient has also had colonoscopy by Dr. Tobi Bastos yesterday. Normal examination. # 07/16/2020 cycle 1 FOLFIRI  # 07/20/2020 PET scan was done for further evaluation, images are consistent with local recurrence, no distant metastasis. #Discussed with radiation oncology Dr. Rushie Chestnut will recommends concurrent chemotherapy and radiation. 08/02/2020-08/16/2020, patient starts radiation.  Xeloda  was held due to neutropenia 08/17/2020,-09/06/2020 Xeloda 1500 mg twice daily concurrently with radiation  01/31/21 started on FOLFIRI + Bev 05/18/2021 CT chest abdomen pelvis showed Previously noted enlargement of bilateral inguinal lymph nodes is resolved, consistent with treatment response of nodal metastatic disease. Interval decrease in size of multiple small bilateral pulmonary nodules, consistent with treatment response of pulmonary metastatic disease. No evidence of new metastatic disease. 05/24/2021 - 08/30/2021, continued on FOLFIRI plus bevacizumab.  Irinotecan dose was reduced, eventually 100mg /m2  09/02/2021, CT chest abdomen pelvis without contrast Showed small bilateral pulmonary nodules, unchanged.  Stable metastatic disease.  No noncontrast evidence of new metastatic disease in the chest abdomen pelvis.  Small parastomal hernia.  Enlargement of main pulmonary artery.  Coronary artery disease.  09/13/2021, maintenance 5-FU/bevacizumab 11/28/2021, 5-FU/Irinotecan/bevacizumab.  Irinotecan 100 mg/m2 was added back due to progressively increasing CEA.  -right lower extremity edema, US showed non occlusive DVT, edema has improved.   INTERVAL HISTORY Jacqueline Yoder is a 77 y.o. female who has above history reviewed by me presents for follow-up of rectal cancer. On Irinotecan and panitumumab. she tolerates well.   She was accompanied by her daughter. - chronic diarrhea is manageable.  She take Lomotil PRN, usually 1-2 time per day.  -one episode of vaginal spotting during the interval.  - right shoulder pain for 1-2 day, she took tylenol and pain has resolved.    Review of Systems  Constitutional:  Positive for fatigue. Negative for appetite change, chills, fever and unexpected weight change.  HENT:   Negative for hearing loss and voice change.   Eyes:  Negative for eye problems.  Respiratory:  Negative for chest tightness and cough.   Cardiovascular:  Negative for chest pain.   Gastrointestinal:  Positive for diarrhea. Negative for abdominal distention, abdominal pain, blood in stool, constipation and nausea.  Endocrine: Negative for hot flashes.  Genitourinary:  Positive for vaginal bleeding. Negative for difficulty urinating and frequency.   Musculoskeletal:  Positive for arthralgias.  Skin:  Negative for itching.  Neurological:  Negative for extremity weakness and numbness.  Hematological:  Negative for adenopathy.  Psychiatric/Behavioral:  Negative for confusion.     MEDICAL HISTORY:  Past Medical History:  Diagnosis Date   Allergy    Arthritis    Blood clot in vein    Family history of colon cancer    GERD (gastroesophageal reflux disease)    Hypercholesteremia    Hypertension    Hypertension    Lower extremity edema    Personal history of chemotherapy    Rectal cancer (HCC) 12/2018   Urinary incontinence     SURGICAL HISTORY: Past Surgical History:  Procedure Laterality Date   ABDOMINAL HYSTERECTOMY     CHOLECYSTECTOMY  1971   COLONOSCOPY WITH PROPOFOL N/A 12/03/2018   Procedure: COLONOSCOPY WITH PROPOFOL;  Surgeon: Midge Minium, MD;  Location: [facility] ENDOSCOPY;  Service: Endoscopy;  Laterality: N/A;   COLONOSCOPY WITH PROPOFOL N/A 07/15/2020   Procedure: COLONOSCOPY WITH PROPOFOL;  Surgeon: Wyline Mood, MD;  Location: [facility] ENDOSCOPY;  Service: Gastroenterology;  Laterality: N/A;   FLEXIBLE SIGMOIDOSCOPY N/A 12/06/2018   Procedure: FLEXIBLE SIGMOIDOSCOPY;  Surgeon: Wyline Mood, MD;  Location: [facility] ENDOSCOPY;  Service: Endoscopy;  Laterality: N/A;   LAPAROSCOPIC COLOSTOMY  01/06/2019   PORTACATH PLACEMENT N/A 04/03/2019   Procedure: INSERTION PORT-A-CATH;  Surgeon: Leafy Ro, MD;  Location: [facility] ORS;  Service: General;  Laterality: N/A;    SOCIAL HISTORY: Social History   Socioeconomic History   Marital  status: Widowed    Spouse name: Not on file   Number of children: Not on file   Years of education: Not on file   Highest education  level: Not on file  Occupational History   Not on file  Tobacco Use   Smoking status: Former    Current packs/day: 0.00    Types: Cigarettes    Quit date: 12/02/1977    Years since quitting: 45.5   Smokeless tobacco: Former  Building services engineer status: Never Used  Substance and Sexual Activity   Alcohol use: Never   Drug use: Never   Sexual activity: Not Currently    Birth control/protection: None  Other Topics Concern   Not on file  Social History Narrative   Lives with daughter   Social Determinants of Health   Financial Resource Strain: Medium Risk (06/30/2022)   Overall Financial Resource Strain (CARDIA)    Difficulty of Paying Living Expenses: Somewhat hard  Food Insecurity: No Food Insecurity (06/30/2022)   Hunger Vital Sign    Worried About Running Out of Food in the Last Year: Never true    Ran Out of Food in the Last Year: Never true  Transportation Needs: No Transportation Needs (06/30/2022)   PRAPARE - Administrator, Civil Service (Medical): No    Lack of Transportation (Non-Medical): No  Physical Activity: Inactive (06/30/2022)   Exercise Vital Sign    Days of Exercise per Week: 0 days    Minutes of Exercise per Session: 0 min  Stress: No Stress Concern Present (06/30/2022)   [employer] of Occupational Health - Occupational Stress Questionnaire    Feeling of Stress : Not at all  Social Connections: Moderately Isolated (06/30/2022)   Social Connection and Isolation Panel [NHANES]    Frequency of Communication with Friends and Family: More than three times a week    Frequency of Social Gatherings with Friends and Family: Twice a week    Attends Religious Services: More than 4 times per year    Active Member of [employer] or Organizations: No    Attends Banker Meetings: Never    Marital Status: Widowed  Intimate Partner Violence: Not At Risk (06/30/2022)   Humiliation, Afraid, Rape, and Kick questionnaire    Fear of Current or  Ex-Partner: No    Emotionally Abused: No    Physically Abused: No    Sexually Abused: No    FAMILY HISTORY: Family History  Problem Relation Age of Onset   Colon cancer Brother 77       exposure to chemicals [place]   Hypertension Mother    Stroke Mother    Kidney failure Father    Breast cancer Neg Hx    Ovarian cancer Neg Hx     ALLERGIES:  is allergic to sulfamethoxazole-trimethoprim.  MEDICATIONS:  Current Outpatient Medications  Medication Sig Dispense Refill   Cholecalciferol (VITAMIN D3) 2000 units capsule Take 2,000 Units by mouth daily.     clindamycin (CLINDAGEL) 1 % gel Apply topically 2 (two) times daily. 30 g 5   diclofenac sodium (VOLTAREN) 1 % GEL Apply 2 g topically 4 (four) times daily as needed (joint pain).  11   diphenoxylate-atropine (LOMOTIL) 2.5-0.025 MG tablet Take 1 tablet by mouth 4 (four) times daily as needed for diarrhea or loose stools. 120 tablet 0   ELIQUIS 2.5 MG TABS tablet TAKE 1 TABLET BY MOUTH TWICE  DAILY 200 tablet 2   fluticasone (FLONASE) 50 MCG/ACT nasal  spray USE 1 SPRAY IN EACH NOSTRIL ONCE DAILY 16 g 3   ipratropium (ATROVENT) 0.03 % nasal spray Place 1 spray into both nostrils 2 (two) times daily.     ketoconazole (NIZORAL) 2 % cream Apply 1 Application topically daily. For up to 2 weeks. May repeat if need. 30 g 2   lidocaine-prilocaine (EMLA) cream Apply 1 application. topically as needed. 30 g 6   loperamide (IMODIUM) 2 MG capsule Take 1 capsule (2 mg total) by mouth See admin instructions. With onset of loose stool, take 4mg  followed by 2mg  every 2 hours,  Maximum: 16 mg/day 120 capsule 1   loratadine (CLARITIN) 10 MG tablet Take 10 mg by mouth daily.     magnesium chloride (SLOW-MAG) 64 MG TBEC SR tablet Take 1 tablet (64 mg total) by mouth 2 (two) times daily. 60 tablet 2   Multiple Vitamins-Minerals (ONE-A-DAY WOMENS 50 PLUS PO) Take 1 tablet by mouth daily.      nystatin (MYCOSTATIN/NYSTOP) powder APPLY 1 POWDER TOPICALLY THREE  TIMES DAILY 45 g 0   potassium chloride SA (KLOR-CON M) 20 MEQ tablet TAKE 1 TABLET BY MOUTH DAILY 100 tablet 2   simvastatin (ZOCOR) 40 MG tablet Take 1 tablet (40 mg total) by mouth at bedtime. 100 tablet 3   triamcinolone cream (KENALOG) 0.5 % Apply 1 Application topically 2 (two) times daily. To affected areas, for up to 2 weeks. 30 g 2   triamterene-hydrochlorothiazide (DYAZIDE) 37.5-25 MG capsule Take 1 each (1 capsule total) by mouth daily. 100 capsule 3   zinc gluconate 50 MG tablet Take 50 mg by mouth daily.     No current facility-administered medications for this visit.   Facility-Administered Medications Ordered in Other Visits  Medication Dose Route Frequency Provider Last Rate Last Admin   heparin lock flush 100 UNIT/ML injection            heparin lock flush 100 unit/mL  500 Units Intracatheter Once PRN [name], MD       irinotecan (CAMPTOSAR) 180 mg in sodium chloride 0.9 % 500 mL chemo infusion  100 mg/m2 (Treatment Plan Recorded) Intravenous Once [name], MD       magnesium sulfate IVPB 4 g 100 mL  4 g Intravenous Daily PRN [name], MD 50 mL/hr at 06/19/23 0939 4 g at 06/19/23 0939   panitumumab (VECTIBIX) 400 mg in sodium chloride 0.9 % 100 mL chemo infusion  6 mg/kg (Order-Specific) Intravenous Once [name], MD 240 mL/hr at 06/19/23 1210 400 mg at 06/19/23 1210     PHYSICAL EXAMINATION: ECOG PERFORMANCE STATUS: 1 - Symptomatic but completely ambulatory  Physical Exam Constitutional:      General: She is not in acute distress. HENT:     Head: Normocephalic and atraumatic.  Eyes:     General: No scleral icterus. Cardiovascular:     Rate and Rhythm: Normal rate.     Heart sounds: Normal heart sounds.  Pulmonary:     Effort: Pulmonary effort is normal. No respiratory distress.     Breath sounds: No wheezing.  Abdominal:     General: Bowel sounds are normal. There is no distension.     Palpations: Abdomen is soft.     Comments: + Colostomy bag    Musculoskeletal:        General: No deformity. Normal range of motion.     Cervical back: Normal range of motion and neck supple.  Skin:    General: Skin is warm and  dry.  Neurological:     Mental Status: She is alert and oriented to person, place, and time. Mental status is at baseline.     Cranial Nerves: No cranial nerve deficit.     Coordination: Coordination normal.       LABORATORY DATA:  I have reviewed the data as listed    Latest Ref Rng & Units 06/19/2023    8:14 AM 06/05/2023    8:34 AM 05/22/2023    8:35 AM  CBC  WBC 4.0 - 10.5 K/uL 7.2  6.1  5.0   Hemoglobin 12.0 - 15.0 g/dL 53.6  64.4  03.4   Hematocrit 36.0 - 46.0 % 36.4  36.7  36.8   Platelets 150 - 400 K/uL 199  185  176       Latest Ref Rng & Units 06/19/2023    8:14 AM 06/05/2023    8:34 AM 05/22/2023    8:35 AM  CMP  Glucose 70 - 99 mg/dL 742  595  95   BUN 8 - 23 mg/dL 12  14  13    Creatinine 0.44 - 1.00 mg/dL 6.38  7.56  4.33   Sodium 135 - 145 mmol/L 138  136  138   Potassium 3.5 - 5.1 mmol/L 3.7  3.3  3.5   Chloride 98 - 111 mmol/L 105  103  103   CO2 22 - 32 mmol/L 26  27  26    Calcium 8.9 - 10.3 mg/dL 8.7  8.7  8.7   Total Protein 6.5 - 8.1 g/dL 6.3  6.7  6.6   Total Bilirubin 0.3 - 1.2 mg/dL 0.3  0.4  0.5   Alkaline Phos 38 - 126 U/L 75  73  79   AST 15 - 41 U/L 37  38  32   ALT 0 - 44 U/L 25  24  23       RADIOGRAPHIC STUDIES: I have personally reviewed the radiological images as listed and agreed with the findings in the report. US Venous Img Lower Unilateral Right  Result Date: 05/18/2023 CLINICAL DATA:  Right lower extremity swelling EXAM: RIGHT LOWER EXTREMITY VENOUS DOPPLER ULTRASOUND TECHNIQUE: Gray-scale sonography with compression, as well as color and duplex ultrasound, were performed to evaluate the deep venous system(s) from the level of the common femoral vein through the popliteal and proximal calf veins. COMPARISON:  01/13/2020 FINDINGS: VENOUS Small amount of nonocclusive thrombus  noted in the right posterior tibial vein. The thrombus is echogenic and eccentric which suggests that it is chronic. Otherwise normal compressibility of the common femoral, superficial femoral, and popliteal veins, as well as the visualized calf veins. Visualized portions of profunda femoral vein and great saphenous vein unremarkable. No additional filling defects to suggest DVT on grayscale or color Doppler imaging. Doppler waveforms show normal direction of venous flow, normal respiratory plasticity and response to augmentation. Limited views of the contralateral common femoral vein are unremarkable. OTHER 3.9 x 1.1 x 1.2 cm fluid collection in the right popliteal fossa likely Baker's cyst. Limitations: none IMPRESSION: Small amount of chronic nonocclusive DVT noted in the right posterior tibial vein. Electronically Signed   By: Acquanetta Belling M.D.   On: 05/18/2023 14:36      US Venous Img Lower Unilateral Right  Result Date: 05/18/2023 CLINICAL DATA:  Right lower extremity swelling EXAM: RIGHT LOWER EXTREMITY VENOUS DOPPLER ULTRASOUND TECHNIQUE: Gray-scale sonography with compression, as well as color and duplex ultrasound, were performed to evaluate the deep venous system(s) from the  level of the common femoral vein through the popliteal and proximal calf veins. COMPARISON:  01/13/2020 FINDINGS: VENOUS Small amount of nonocclusive thrombus noted in the right posterior tibial vein. The thrombus is echogenic and eccentric which suggests that it is chronic. Otherwise normal compressibility of the common femoral, superficial femoral, and popliteal veins, as well as the visualized calf veins. Visualized portions of profunda femoral vein and great saphenous vein unremarkable. No additional filling defects to suggest DVT on grayscale or color Doppler imaging. Doppler waveforms show normal direction of venous flow, normal respiratory plasticity and response to augmentation. Limited views of the contralateral common  femoral vein are unremarkable. OTHER 3.9 x 1.1 x 1.2 cm fluid collection in the right popliteal fossa likely Baker's cyst. Limitations: none IMPRESSION: Small amount of chronic nonocclusive DVT noted in the right posterior tibial vein. Electronically Signed   By: Acquanetta Belling M.D.   On: 05/18/2023 14:36

## 2023-06-19 NOTE — Patient Instructions (Signed)
[facility]  Discharge Instructions: Thank you for choosing [facility] to provide your oncology and hematology care.  If you have a lab appointment with the Cancer Center, please go directly to the Cancer Center and check in at the registration area.  Wear comfortable clothing and clothing appropriate for easy access to any Portacath or PICC line.   We strive to give you quality time with your provider. You may need to reschedule your appointment if you arrive late (15 or more minutes).  Arriving late affects you and other patients whose appointments are after yours.  Also, if you miss three or more appointments without notifying the office, you may be dismissed from the clinic at the provider's discretion.      For prescription refill requests, have your pharmacy contact our office and allow 72 hours for refills to be completed.    Today you received the following chemotherapy and/or immunotherapy agents Vectibix, Irinotecan, Magnesium      To help prevent nausea and vomiting after your treatment, we encourage you to take your nausea medication as directed.  BELOW ARE SYMPTOMS THAT SHOULD BE REPORTED IMMEDIATELY: *FEVER GREATER THAN 100.4 F (38 C) OR HIGHER *CHILLS OR SWEATING *NAUSEA AND VOMITING THAT IS NOT CONTROLLED WITH YOUR NAUSEA MEDICATION *UNUSUAL SHORTNESS OF BREATH *UNUSUAL BRUISING OR BLEEDING *URINARY PROBLEMS (pain or burning when urinating, or frequent urination) *BOWEL PROBLEMS (unusual diarrhea, constipation, pain near the anus) TENDERNESS IN MOUTH AND THROAT WITH OR WITHOUT PRESENCE OF ULCERS (sore throat, sores in mouth, or a toothache) UNUSUAL RASH, SWELLING OR PAIN  UNUSUAL VAGINAL DISCHARGE OR ITCHING   Items with * indicate a potential emergency and should be followed up as soon as possible or go to the Emergency Department if any problems should occur.  Please show the CHEMOTHERAPY ALERT CARD or IMMUNOTHERAPY  ALERT CARD at check-in to the Emergency Department and triage nurse.  Should you have questions after your visit or need to cancel or reschedule your appointment, please contact [facility]  [phone] and follow the prompts.  Office hours are 8:00 a.m. to 4:30 p.m. Monday - Friday. Please note that voicemails left after 4:00 p.m. may not be returned until the following business day.  We are closed weekends and major holidays. You have access to a nurse at all times for urgent questions. Please call the main number to the clinic [phone] and follow the prompts.  For any non-urgent questions, you may also contact your provider using MyChart. We now offer e-Visits for anyone 68 and older to request care online for non-urgent symptoms. For details visit [email or web address].   Also download the MyChart app! Go to the app store, search "MyChart", open the app, select [place], and log in with your MyChart username and password.

## 2023-06-19 NOTE — Assessment & Plan Note (Signed)
patient will receive long-acting GCSF prophylaxis on D3 

## 2023-06-19 NOTE — Assessment & Plan Note (Signed)
 History of PE and bilateral lower extremity DVT  05/18/23 Repeat right LE US showed chronic non occlusive DVT Continue  Eliquis 2.5 mg twice daily for anticoagulation prophylaxis. Recommend leg elevation and compression stocking.

## 2023-06-19 NOTE — Telephone Encounter (Signed)
Liquid biopsy testing requested. Blood collected today and taken to [employer] box for shipping.    Financial application was submitted and approved. Pt's out of-of-pocket cost will be $0

## 2023-06-19 NOTE — Assessment & Plan Note (Signed)
Chemotherapy plan as listed above 

## 2023-06-19 NOTE — Progress Notes (Signed)
Pt here for follow up. Pt reports that over the weekend she had pain to right shoulder, which has resolved. One time episode.

## 2023-06-19 NOTE — Assessment & Plan Note (Addendum)
History of stage IIIC Rectal cancer, s/p TNT, followed by 09/17/19 APR/posterior vaginectomy/TAH/BSO/VY-flap, pT4b pN0 with close vaginal margin 0.2 mm.  Uterus and ovaries negative for malignancy. palliative radiation to vaginal recurrence- 01/19/21 recurrence with lung metastasis.-Palliative -FOLFIRI plus bevacizumab.  Irinotecan was dropped in November 2022 due to side effects. Negative for UGT1A1*28 - radiographically stable, rise of CEA-July 2023 CT lung metastasis worse--> Dec 2023 PET showed progression in pelvic lymph nodes and bone lesions,--> 2nd line irinotecan +panitumumab-->CT March 2024 stable. --> June 2024 CT showed mild lung progression/Progressive left iliac bone metastasis    Labs are reviewed and discussed with patient, CEA nadir was in 490, currently gradually increasing, 1402, she asymptomatic.  Proceed with irinotecan +panitumumab  repeat CT in Sept 2024 Obtain Tempus Liquid biopsy

## 2023-06-19 NOTE — Assessment & Plan Note (Signed)
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 Discussed with GynOnc She has another episode of spottin, recommend palliative radiation.  Vulvar mass location is difficult for biopsy in the clinic per gynonc.  Patient is willing to further discuss with Gynonc regarding biopsy under anaesthesia, if no other better biopsy sites on CT

## 2023-06-20 DIAGNOSIS — Z556 Problems related to health literacy: Secondary | ICD-10-CM | POA: Diagnosis not present

## 2023-06-20 DIAGNOSIS — Z87891 Personal history of nicotine dependence: Secondary | ICD-10-CM | POA: Diagnosis not present

## 2023-06-20 DIAGNOSIS — Z7901 Long term (current) use of anticoagulants: Secondary | ICD-10-CM | POA: Diagnosis not present

## 2023-06-20 DIAGNOSIS — Z79899 Other long term (current) drug therapy: Secondary | ICD-10-CM | POA: Diagnosis not present

## 2023-06-20 DIAGNOSIS — L89152 Pressure ulcer of sacral region, stage 2: Secondary | ICD-10-CM | POA: Diagnosis not present

## 2023-06-20 DIAGNOSIS — Z933 Colostomy status: Secondary | ICD-10-CM | POA: Diagnosis not present

## 2023-06-20 DIAGNOSIS — I10 Essential (primary) hypertension: Secondary | ICD-10-CM | POA: Diagnosis not present

## 2023-06-20 DIAGNOSIS — B372 Candidiasis of skin and nail: Secondary | ICD-10-CM | POA: Diagnosis not present

## 2023-06-20 LAB — CEA: CEA: 1799 ng/mL — ABNORMAL HIGH (ref 0.0–4.7)

## 2023-06-21 ENCOUNTER — Inpatient Hospital Stay: Payer: 59

## 2023-06-21 DIAGNOSIS — B372 Candidiasis of skin and nail: Secondary | ICD-10-CM | POA: Diagnosis not present

## 2023-06-21 DIAGNOSIS — Z87891 Personal history of nicotine dependence: Secondary | ICD-10-CM | POA: Diagnosis not present

## 2023-06-21 DIAGNOSIS — Z79899 Other long term (current) drug therapy: Secondary | ICD-10-CM | POA: Diagnosis not present

## 2023-06-21 DIAGNOSIS — L89152 Pressure ulcer of sacral region, stage 2: Secondary | ICD-10-CM | POA: Diagnosis not present

## 2023-06-21 DIAGNOSIS — Z7901 Long term (current) use of anticoagulants: Secondary | ICD-10-CM | POA: Diagnosis not present

## 2023-06-21 DIAGNOSIS — Z556 Problems related to health literacy: Secondary | ICD-10-CM | POA: Diagnosis not present

## 2023-06-21 DIAGNOSIS — I10 Essential (primary) hypertension: Secondary | ICD-10-CM | POA: Diagnosis not present

## 2023-06-21 DIAGNOSIS — C20 Malignant neoplasm of rectum: Secondary | ICD-10-CM

## 2023-06-21 DIAGNOSIS — C799 Secondary malignant neoplasm of unspecified site: Secondary | ICD-10-CM

## 2023-06-21 MED ORDER — PEGFILGRASTIM INJECTION 6 MG/0.6ML ~~LOC~~
6.0000 mg | PREFILLED_SYRINGE | Freq: Once | SUBCUTANEOUS | Status: DC
  Filled 2023-06-21: qty 0.6

## 2023-06-22 DIAGNOSIS — I10 Essential (primary) hypertension: Secondary | ICD-10-CM | POA: Diagnosis not present

## 2023-06-22 DIAGNOSIS — Z556 Problems related to health literacy: Secondary | ICD-10-CM | POA: Diagnosis not present

## 2023-06-22 DIAGNOSIS — Z87891 Personal history of nicotine dependence: Secondary | ICD-10-CM | POA: Diagnosis not present

## 2023-06-22 DIAGNOSIS — B372 Candidiasis of skin and nail: Secondary | ICD-10-CM | POA: Diagnosis not present

## 2023-06-22 DIAGNOSIS — Z7901 Long term (current) use of anticoagulants: Secondary | ICD-10-CM | POA: Diagnosis not present

## 2023-06-22 DIAGNOSIS — Z79899 Other long term (current) drug therapy: Secondary | ICD-10-CM | POA: Diagnosis not present

## 2023-06-22 DIAGNOSIS — L89152 Pressure ulcer of sacral region, stage 2: Secondary | ICD-10-CM | POA: Diagnosis not present

## 2023-06-25 ENCOUNTER — Telehealth: Payer: Self-pay

## 2023-06-25 ENCOUNTER — Ambulatory Visit
Admission: RE | Admit: 2023-06-25 | Discharge: 2023-06-25 | Disposition: A | Discharge status: Home / Self Care | Payer: 59 | Source: Ambulatory Visit | Attending: Oncology | Admitting: Oncology

## 2023-06-25 DIAGNOSIS — C20 Malignant neoplasm of rectum: Secondary | ICD-10-CM | POA: Insufficient documentation

## 2023-06-25 DIAGNOSIS — K769 Liver disease, unspecified: Secondary | ICD-10-CM | POA: Diagnosis not present

## 2023-06-25 DIAGNOSIS — I700 Atherosclerosis of aorta: Secondary | ICD-10-CM | POA: Diagnosis not present

## 2023-06-25 DIAGNOSIS — C799 Secondary malignant neoplasm of unspecified site: Secondary | ICD-10-CM | POA: Diagnosis not present

## 2023-06-25 MED ORDER — IOHEXOL 300 MG/ML  SOLN
85.0000 mL | Freq: Once | INTRAMUSCULAR | Status: AC | PRN
  Administered 2023-06-25: 85 mL via INTRAVENOUS

## 2023-06-25 MED ORDER — BARIUM SULFATE 2 % PO SUSP
450.0000 mL | ORAL | Status: AC
  Administered 2023-06-25 (×2): 450 mL via ORAL

## 2023-06-25 NOTE — Telephone Encounter (Signed)
Okay to proceed with orders  Saralyn Pilar, DO [facility] 06/25/2023, 1:01 PM

## 2023-06-25 NOTE — Telephone Encounter (Signed)
Copied from CRM [id number]. Topic: Quick Communication - Home Health Verbal Orders >> Jun 22, 2023  3:35 PM Everette C wrote: Caller/Agency: Darral Dash / [facility]  Callback Number:  [phone] Requesting OT/PT/Skilled Nursing/Social Work/Speech Therapy: OT Frequency: 1w2

## 2023-06-25 NOTE — Telephone Encounter (Signed)
Esther advised.   Thanks,   -Laura  

## 2023-06-26 ENCOUNTER — Telehealth: Payer: Self-pay

## 2023-06-26 NOTE — Telephone Encounter (Signed)
Patient scheduled and notified.

## 2023-06-26 NOTE — Telephone Encounter (Signed)
-----   Message from [name] sent at 06/25/2023 10:32 PM EDT ----- Please arrange her to see Radonc

## 2023-06-26 NOTE — Telephone Encounter (Signed)
Pt experiencing vaginal bleeding, can you set up appt with dr. Rushie Chestnut for Vaginal radiation discussion.

## 2023-06-27 ENCOUNTER — Telehealth: Payer: Self-pay | Admitting: Family Medicine

## 2023-06-27 DIAGNOSIS — I10 Essential (primary) hypertension: Secondary | ICD-10-CM | POA: Diagnosis not present

## 2023-06-27 DIAGNOSIS — B372 Candidiasis of skin and nail: Secondary | ICD-10-CM | POA: Diagnosis not present

## 2023-06-27 DIAGNOSIS — Z556 Problems related to health literacy: Secondary | ICD-10-CM | POA: Diagnosis not present

## 2023-06-27 DIAGNOSIS — L89152 Pressure ulcer of sacral region, stage 2: Secondary | ICD-10-CM | POA: Diagnosis not present

## 2023-06-27 DIAGNOSIS — Z87891 Personal history of nicotine dependence: Secondary | ICD-10-CM | POA: Diagnosis not present

## 2023-06-27 DIAGNOSIS — Z7901 Long term (current) use of anticoagulants: Secondary | ICD-10-CM | POA: Diagnosis not present

## 2023-06-27 DIAGNOSIS — Z79899 Other long term (current) drug therapy: Secondary | ICD-10-CM | POA: Diagnosis not present

## 2023-06-27 NOTE — Telephone Encounter (Signed)
Okay to proceed with verbal HH orders  Saralyn Pilar, DO [facility] 06/27/2023, 2:55 PM

## 2023-06-27 NOTE — Telephone Encounter (Signed)
Home Health Verbal Orders - Mandy/ [facility] Callback Number: [phone] Requesting new wound care orders for the pt Frequency: TBD

## 2023-06-28 ENCOUNTER — Ambulatory Visit
Admission: RE | Admit: 2023-06-28 | Discharge: 2023-06-28 | Disposition: A | Discharge status: Home / Self Care | Payer: 59 | Source: Ambulatory Visit | Attending: Radiation Oncology | Admitting: Radiation Oncology

## 2023-06-28 VITALS — BP 115/68 | HR 90 | Temp 98.9°F | Resp 16 | Wt 161.3 lb

## 2023-06-28 DIAGNOSIS — C20 Malignant neoplasm of rectum: Secondary | ICD-10-CM | POA: Diagnosis not present

## 2023-06-28 NOTE — Telephone Encounter (Signed)
Mandy advised.   Thanks,   -Mickel Baas

## 2023-06-28 NOTE — Consult Note (Signed)
NEW PATIENT EVALUATION  Name: Jacqueline Yoder  MRN: [id number]  Date:   06/28/2023     DOB: Mar 09, 1946   This 77 y.o. female patient presents to the clinic for initial evaluation of vaginal bleeding and patient treated back in 2021 with neoadjuvant chemoradiation therapy for stage IIIc adenocarcinoma the rectum with initial lymph node of the left groin.Marland Kitchen  REFERRING PHYSICIAN: Saralyn Pilar *  CHIEF COMPLAINT: No chief complaint on file.   DIAGNOSIS: The encounter diagnosis was Rectal cancer (HCC).   PREVIOUS INVESTIGATIONS:  CT scans reviewed PET CT scan ordered Clinical notes reviewed Labs and pathology reviewed  HPI: Patient is a 77 year old female well-known to department having been treated back in 2021 with neoadjuvant chemoradiation therapy for stage IIIc (T4b N1a M0 adenocarcinoma the rectum.  She underwent a APR with posterior vaginectomy for pathologic t4b N0 lesion with close vaginal margin of 0.2 mm.  She developed an 2022 recurrence with lung metastasis.  Underwent FOLFIRI plus bevacizumab.  It December 23 PET showed progression of pelvic lymph nodes and bone lesions lymph nodes were positive on PET in both the right inguinal region as well as the right pelvis.  She recently developed vaginal spotting and recommendation for radiation oncology opinion was made.  She is seen today.  She is having occasional vaginal spotting.  She specifically denies any increased lower urinary tract symptoms.  She does have persistent diarrhea attributed to her systemic treatment.  Recent CT scan showed increased size of solid bilateral pulmonary nodules and increased size of right external iliac and inguinal lymph nodes concerning for worsening metastatic disease.  No significant change in left iliac bone metastatic lesion she is having no pelvic pain at this time.  PLANNED TREATMENT REGIMEN: Palliative radiation therapy to pelvis  PAST MEDICAL HISTORY:  has a past medical history of  Allergy, Arthritis, Blood clot in vein, Family history of colon cancer, GERD (gastroesophageal reflux disease), Hypercholesteremia, Hypertension, Hypertension, Lower extremity edema, Personal history of chemotherapy, Rectal cancer (HCC) (12/2018), and Urinary incontinence.    PAST SURGICAL HISTORY:  Past Surgical History:  Procedure Laterality Date   ABDOMINAL HYSTERECTOMY     CHOLECYSTECTOMY  1971   COLONOSCOPY WITH PROPOFOL N/A 12/03/2018   Procedure: COLONOSCOPY WITH PROPOFOL;  Surgeon: Midge Minium, MD;  Location: [facility] ENDOSCOPY;  Service: Endoscopy;  Laterality: N/A;   COLONOSCOPY WITH PROPOFOL N/A 07/15/2020   Procedure: COLONOSCOPY WITH PROPOFOL;  Surgeon: Wyline Mood, MD;  Location: [facility] ENDOSCOPY;  Service: Gastroenterology;  Laterality: N/A;   FLEXIBLE SIGMOIDOSCOPY N/A 12/06/2018   Procedure: FLEXIBLE SIGMOIDOSCOPY;  Surgeon: Wyline Mood, MD;  Location: [facility] ENDOSCOPY;  Service: Endoscopy;  Laterality: N/A;   LAPAROSCOPIC COLOSTOMY  01/06/2019   PORTACATH PLACEMENT N/A 04/03/2019   Procedure: INSERTION PORT-A-CATH;  Surgeon: Leafy Ro, MD;  Location: [facility] ORS;  Service: General;  Laterality: N/A;    FAMILY HISTORY: family history includes Colon cancer (age of onset: 29) in her brother; Hypertension in her mother; Kidney failure in her father; Stroke in her mother.  SOCIAL HISTORY:  reports that she quit smoking about 45 years ago. Her smoking use included cigarettes. She has quit using smokeless tobacco. She reports that she does not drink alcohol and does not use drugs.  ALLERGIES: Sulfamethoxazole-trimethoprim  MEDICATIONS:  Current Outpatient Medications  Medication Sig Dispense Refill   Cholecalciferol (VITAMIN D3) 2000 units capsule Take 2,000 Units by mouth daily.     clindamycin (CLINDAGEL) 1 % gel Apply topically 2 (two) times daily. 30 g  5   diclofenac sodium (VOLTAREN) 1 % GEL Apply 2 g topically 4 (four) times daily as needed (joint pain).  11    diphenoxylate-atropine (LOMOTIL) 2.5-0.025 MG tablet Take 1 tablet by mouth 4 (four) times daily as needed for diarrhea or loose stools. 120 tablet 0   ELIQUIS 2.5 MG TABS tablet TAKE 1 TABLET BY MOUTH TWICE  DAILY 200 tablet 2   fluticasone (FLONASE) 50 MCG/ACT nasal spray USE 1 SPRAY IN EACH NOSTRIL ONCE DAILY 16 g 3   ipratropium (ATROVENT) 0.03 % nasal spray Place 1 spray into both nostrils 2 (two) times daily.     ketoconazole (NIZORAL) 2 % cream Apply 1 Application topically daily. For up to 2 weeks. May repeat if need. 30 g 2   lidocaine-prilocaine (EMLA) cream Apply 1 application. topically as needed. 30 g 6   loperamide (IMODIUM) 2 MG capsule Take 1 capsule (2 mg total) by mouth See admin instructions. With onset of loose stool, take 4mg  followed by 2mg  every 2 hours,  Maximum: 16 mg/day 120 capsule 1   loratadine (CLARITIN) 10 MG tablet Take 10 mg by mouth daily.     magnesium chloride (SLOW-MAG) 64 MG TBEC SR tablet Take 1 tablet (64 mg total) by mouth 2 (two) times daily. 60 tablet 2   Multiple Vitamins-Minerals (ONE-A-DAY WOMENS 50 PLUS PO) Take 1 tablet by mouth daily.      nystatin (MYCOSTATIN/NYSTOP) powder APPLY 1 POWDER TOPICALLY THREE TIMES DAILY 45 g 0   potassium chloride SA (KLOR-CON M) 20 MEQ tablet TAKE 1 TABLET BY MOUTH DAILY 100 tablet 2   simvastatin (ZOCOR) 40 MG tablet Take 1 tablet (40 mg total) by mouth at bedtime. 100 tablet 3   triamcinolone cream (KENALOG) 0.5 % Apply 1 Application topically 2 (two) times daily. To affected areas, for up to 2 weeks. 30 g 2   triamterene-hydrochlorothiazide (DYAZIDE) 37.5-25 MG capsule Take 1 each (1 capsule total) by mouth daily. 100 capsule 3   zinc gluconate 50 MG tablet Take 50 mg by mouth daily.     No current facility-administered medications for this encounter.   Facility-Administered Medications Ordered in Other Encounters  Medication Dose Route Frequency Provider Last Rate Last Admin   heparin lock flush 100 UNIT/ML  injection             ECOG PERFORMANCE STATUS:  1 - Symptomatic but completely ambulatory  REVIEW OF SYSTEMS: Patient denies any weight loss, fatigue, weakness, fever, chills or night sweats. Patient denies any loss of vision, blurred vision. Patient denies any ringing  of the ears or hearing loss. No irregular heartbeat. Patient denies heart murmur or history of fainting. Patient denies any chest pain or pain radiating to her upper extremities. Patient denies any shortness of breath, difficulty breathing at night, cough or hemoptysis. Patient denies any swelling in the lower legs. Patient denies any nausea vomiting, vomiting of blood, or coffee ground material in the vomitus. Patient denies any stomach pain. Patient states has had normal bowel movements no significant constipation or diarrhea. Patient denies any dysuria, hematuria or significant nocturia. Patient denies any problems walking, swelling in the joints or loss of balance. Patient denies any skin changes, loss of hair or loss of weight. Patient denies any excessive worrying or anxiety or significant depression. Patient denies any problems with insomnia. Patient denies excessive thirst, polyuria, polydipsia. Patient denies any swollen glands, patient denies easy bruising or easy bleeding. Patient denies any recent infections, allergies or URI. Patient "  s visual fields have not changed significantly in recent time.   PHYSICAL EXAM: BP 115/68 (BP Location: Left Arm, Patient Position: Sitting)   Pulse 90   Temp 98.9 F (37.2 C) (Tympanic)   Resp 16   Wt 161 lb 4.8 oz (73.2 kg)   BMI 27.69 kg/m  Well-developed well-nourished patient in NAD. HEENT reveals PERLA, EOMI, discs not visualized.  Oral cavity is clear. No oral mucosal lesions are identified. Neck is clear without evidence of cervical or supraclavicular adenopathy. Lungs are clear to A&P. Cardiac examination is essentially unremarkable with regular rate and rhythm without murmur rub  or thrill. Abdomen is benign with no organomegaly or masses noted. Motor sensory and DTR levels are equal and symmetric in the upper and lower extremities. Cranial nerves II through XII are grossly intact. Proprioception is intact. No peripheral adenopathy or edema is identified. No motor or sensory levels are noted. Crude visual fields are within normal range.  LABORATORY DATA: Labs and pathology reviewed    RADIOLOGY RESULTS: CT scans reviewed PET CT scan ordered   IMPRESSION: Progressive stage IV rectal cancer with vaginal bleeding in 77 year old female  PLAN: Based on her previous radiation therapy to her pelvis.  I have ordered a PET CT scan to target area which we will isolate most likely causing her vaginal bleeding.  Once that is identified we will review that and discussed with patient and offer palliative radiation therapy.  Patient comprehends my recommendations well PET scan was ordered and we will set up a follow-up visit shortly after that scan.  I would like to take this opportunity to thank you for allowing me to participate in the care of your patient.Carmina Miller, MD

## 2023-06-29 DIAGNOSIS — C20 Malignant neoplasm of rectum: Secondary | ICD-10-CM | POA: Diagnosis not present

## 2023-07-02 ENCOUNTER — Encounter: Payer: Self-pay | Admitting: Oncology

## 2023-07-02 MED FILL — Dexamethasone Sodium Phosphate Inj 100 MG/10ML: INTRAMUSCULAR | Qty: 1 | Status: AC

## 2023-07-02 NOTE — Telephone Encounter (Signed)
Report sent to scan. Copy given to MD

## 2023-07-03 ENCOUNTER — Inpatient Hospital Stay: Payer: 59

## 2023-07-03 ENCOUNTER — Encounter: Payer: Self-pay | Admitting: Oncology

## 2023-07-03 ENCOUNTER — Inpatient Hospital Stay (HOSPITAL_BASED_OUTPATIENT_CLINIC_OR_DEPARTMENT_OTHER): Payer: 59 | Admitting: Oncology

## 2023-07-03 ENCOUNTER — Ambulatory Visit: Payer: 59

## 2023-07-03 VITALS — BP 139/67 | HR 92 | Temp 96.7°F | Resp 18 | Wt 161.8 lb

## 2023-07-03 VITALS — BP 107/75 | HR 75

## 2023-07-03 DIAGNOSIS — R197 Diarrhea, unspecified: Secondary | ICD-10-CM | POA: Diagnosis not present

## 2023-07-03 DIAGNOSIS — C7951 Secondary malignant neoplasm of bone: Secondary | ICD-10-CM | POA: Diagnosis not present

## 2023-07-03 DIAGNOSIS — D701 Agranulocytosis secondary to cancer chemotherapy: Secondary | ICD-10-CM | POA: Diagnosis not present

## 2023-07-03 DIAGNOSIS — E876 Hypokalemia: Secondary | ICD-10-CM | POA: Diagnosis not present

## 2023-07-03 DIAGNOSIS — K521 Toxic gastroenteritis and colitis: Secondary | ICD-10-CM | POA: Diagnosis not present

## 2023-07-03 DIAGNOSIS — C20 Malignant neoplasm of rectum: Secondary | ICD-10-CM

## 2023-07-03 DIAGNOSIS — T451X5A Adverse effect of antineoplastic and immunosuppressive drugs, initial encounter: Secondary | ICD-10-CM

## 2023-07-03 DIAGNOSIS — C799 Secondary malignant neoplasm of unspecified site: Secondary | ICD-10-CM | POA: Diagnosis not present

## 2023-07-03 DIAGNOSIS — Z5111 Encounter for antineoplastic chemotherapy: Secondary | ICD-10-CM | POA: Diagnosis not present

## 2023-07-03 DIAGNOSIS — D6481 Anemia due to antineoplastic chemotherapy: Secondary | ICD-10-CM | POA: Diagnosis not present

## 2023-07-03 DIAGNOSIS — Z86711 Personal history of pulmonary embolism: Secondary | ICD-10-CM | POA: Diagnosis not present

## 2023-07-03 DIAGNOSIS — N939 Abnormal uterine and vaginal bleeding, unspecified: Secondary | ICD-10-CM

## 2023-07-03 DIAGNOSIS — C7800 Secondary malignant neoplasm of unspecified lung: Secondary | ICD-10-CM | POA: Diagnosis not present

## 2023-07-03 DIAGNOSIS — Z5189 Encounter for other specified aftercare: Secondary | ICD-10-CM | POA: Diagnosis not present

## 2023-07-03 LAB — COMPREHENSIVE METABOLIC PANEL
ALT: 25 U/L (ref 0–44)
AST: 36 U/L (ref 15–41)
Albumin: 3.9 g/dL (ref 3.5–5.0)
Alkaline Phosphatase: 85 U/L (ref 38–126)
Anion gap: 5 (ref 5–15)
BUN: 13 mg/dL (ref 8–23)
CO2: 27 mmol/L (ref 22–32)
Calcium: 8.7 mg/dL — ABNORMAL LOW (ref 8.9–10.3)
Chloride: 105 mmol/L (ref 98–111)
Creatinine, Ser: 0.73 mg/dL (ref 0.44–1.00)
GFR, Estimated: >60 mL/min (ref >60)
Glucose, Bld: 102 mg/dL — ABNORMAL HIGH (ref 70–99)
Potassium: 3.5 mmol/L (ref 3.5–5.1)
Sodium: 137 mmol/L (ref 135–145)
Total Bilirubin: 0.5 mg/dL (ref 0.3–1.2)
Total Protein: 6.5 g/dL (ref 6.5–8.1)

## 2023-07-03 LAB — CBC WITH DIFFERENTIAL/PLATELET
Abs Immature Granulocytes: 0.03 10*3/uL (ref 0.00–0.07)
Basophils Absolute: 0 10*3/uL (ref 0.0–0.1)
Basophils Relative: 1 %
Eosinophils Absolute: 0.1 10*3/uL (ref 0.0–0.5)
Eosinophils Relative: 2 %
HCT: 36.4 % (ref 36.0–46.0)
Hemoglobin: 11.4 g/dL — ABNORMAL LOW (ref 12.0–15.0)
Immature Granulocytes: 1 %
Lymphocytes Relative: 20 %
Lymphs Abs: 1.2 10*3/uL (ref 0.7–4.0)
MCH: 30 pg (ref 26.0–34.0)
MCHC: 31.3 g/dL (ref 30.0–36.0)
MCV: 95.8 fL (ref 80.0–100.0)
Monocytes Absolute: 0.5 10*3/uL (ref 0.1–1.0)
Monocytes Relative: 8 %
Neutro Abs: 4.3 10*3/uL (ref 1.7–7.7)
Neutrophils Relative %: 68 %
Platelets: 222 10*3/uL (ref 150–400)
RBC: 3.8 MIL/uL — ABNORMAL LOW (ref 3.87–5.11)
RDW: 16 % — ABNORMAL HIGH (ref 11.5–15.5)
WBC: 6.2 10*3/uL (ref 4.0–10.5)
nRBC: 0 % (ref 0.0–0.2)

## 2023-07-03 LAB — MAGNESIUM: Magnesium: 1.4 mg/dL — ABNORMAL LOW (ref 1.7–2.4)

## 2023-07-03 MED ORDER — ATROPINE SULFATE 1 MG/ML IV SOLN
0.5000 mg | Freq: Once | INTRAVENOUS | Status: AC
  Administered 2023-07-03: 0.5 mg via INTRAVENOUS
  Filled 2023-07-03: qty 1

## 2023-07-03 MED ORDER — SODIUM CHLORIDE 0.9 % IV SOLN
100.0000 mg/m2 | Freq: Once | INTRAVENOUS | Status: AC
  Administered 2023-07-03: 180 mg via INTRAVENOUS
  Filled 2023-07-03: qty 9

## 2023-07-03 MED ORDER — HEPARIN SOD (PORK) LOCK FLUSH 100 UNIT/ML IV SOLN
500.0000 [IU] | Freq: Once | INTRAVENOUS | Status: AC | PRN
  Administered 2023-07-03: 500 [IU]
  Filled 2023-07-03: qty 5

## 2023-07-03 MED ORDER — SODIUM CHLORIDE 0.9 % IV SOLN
6.0000 mg/kg | Freq: Once | INTRAVENOUS | Status: AC
  Administered 2023-07-03: 400 mg via INTRAVENOUS
  Filled 2023-07-03: qty 20

## 2023-07-03 MED ORDER — DIPHENHYDRAMINE HCL 50 MG/ML IJ SOLN
25.0000 mg | Freq: Once | INTRAMUSCULAR | Status: AC
  Administered 2023-07-03: 25 mg via INTRAVENOUS
  Filled 2023-07-03: qty 1

## 2023-07-03 MED ORDER — MAGNESIUM SULFATE 4 GM/100ML IV SOLN
4.0000 g | Freq: Every day | INTRAVENOUS | Status: DC | PRN
  Administered 2023-07-03: 4 g via INTRAVENOUS
  Filled 2023-07-03: qty 100

## 2023-07-03 MED ORDER — SODIUM CHLORIDE 0.9 % IV SOLN
10.0000 mg | Freq: Once | INTRAVENOUS | Status: AC
  Administered 2023-07-03: 10 mg via INTRAVENOUS
  Filled 2023-07-03: qty 10

## 2023-07-03 MED ORDER — SODIUM CHLORIDE 0.9 % IV SOLN
Freq: Once | INTRAVENOUS | Status: AC
  Filled 2023-07-03: qty 250

## 2023-07-03 MED ORDER — PALONOSETRON HCL INJECTION 0.25 MG/5ML
0.2500 mg | Freq: Once | INTRAVENOUS | Status: AC
  Administered 2023-07-03: 0.25 mg via INTRAVENOUS
  Filled 2023-07-03: qty 5

## 2023-07-03 NOTE — Assessment & Plan Note (Signed)
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 Discussed with GynOnc She has another episode of spottin, recommend palliative radiation.  Vulvar mass location is difficult for biopsy in the clinic per gynonc.  Pending Radonc decision of radiation.

## 2023-07-03 NOTE — Progress Notes (Signed)
Hematology/Oncology Progress note [phone] [phone]      CHIEF COMPLAINTS/REASON FOR VISIT:  Follow up for rectal cancer  ASSESSMENT & PLAN:   Cancer Staging  Rectal cancer [facility]) Staging form: Colon and Rectum, AJCC 8th Edition - Pathologic stage from 10/06/2019: Stage IIC (ypT4b, pN0, cM0) - Signed by Jacqueline Patience, MD on 10/06/2019 - Clinical stage from 06/30/2020: Jacqueline Yoder - Signed by Jacqueline Patience, MD on 04/17/2022 - Pathologic: Stage Unknown (rpTX, pNX, cM1) - Signed by Jacqueline Patience, MD on 01/31/2021   Rectal cancer [facility]) History of stage IIIC Rectal cancer, s/p TNT, followed by 09/17/19 APR/posterior vaginectomy/TAH/BSO/VY-flap, pT4b pN0 with close vaginal margin 0.2 mm.  Uterus and ovaries negative for malignancy. palliative radiation to vaginal recurrence- 01/19/21 recurrence with lung metastasis.-Palliative -FOLFIRI plus bevacizumab.  Irinotecan was dropped in November 2022 due to Yoder effects. Negative for UGT1A1*28 - radiographically stable, rise of CEA-July 2023 CT lung metastasis worse--> Dec 2023 PET showed progression in pelvic lymph nodes and bone lesions,--> 2nd line irinotecan +panitumumab-->CT March 2024 stable. --> June 2024 CT showed mild lung progression/Progressive left iliac bone metastasis    Labs are reviewed and discussed with patient, CEA nadir was in 490, currently gradually increasing, 1402, she asymptomatic.  Proceed with irinotecan +panitumumab   vulvar mass difficult for biopsy in office,  Tempus Liquid biopsy - no actionable variants  06/25/2023 CT showed progression of lung nodules [SLD increase 15mm] and iliac inguinal lymphadenopathy.  Consider switch to FOLFOX regimen in the future.  Depending on PET scan, Dr. Eliseo Yoder will decide if he will offer her any RT.    Encounter for antineoplastic chemotherapy Chemotherapy plan as listed above.   Anemia due to antineoplastic chemotherapy Hb stable observation  Chemotherapy induced  neutropenia (HCC) patient will receive long-acting GCSF prophylaxis on D3  Vaginal spotting likely recurrent rectal cancer involving vagina. Discussed with GynOnc She has another episode of spottin, recommend palliative radiation.  Vulvar mass location is difficult for biopsy in the clinic per gynonc.  Pending Radonc decision of radiation.   History of pulmonary embolism History of PE and bilateral lower extremity DVT  05/18/23 Repeat right LE US showed chronic non occlusive DVT Continue  Eliquis 2.5 mg twice daily for anticoagulation prophylaxis. Recommend leg elevation and compression stocking.   Chemotherapy induced diarrhea continue lomotil  QID PRN as instructed.   Hypokalemia Continue potassium chloride 20 mEq daily.    Hypomagnesemia If magnesium is 1.0 - 1.2:  administrate 6 gm IV magnesium sulfate If magnesium is 1.2-1.4, administrate 4 gm IV magnesium sulfate If magnesium is 1.5-1.6:  administrate 2 gm IV magnesium sulfate.  Continue slow mag orally 64mg  twice daily.     Follow up TBD All questions were answered. The patient knows to call the clinic with any problems, questions or concerns.  Jacqueline Patience, MD, PhD [facility] Hematology Oncology 07/03/2023      HISTORY OF PRESENTING ILLNESS:  Patient initially presented with complaints of postmenopausal bleeding on 08/16/2018.  History of was menopausal vaginal bleeding in 2016 which resulted in cervical polypectomy.  Pathology 02/04/2015 showed cervical polyp, consistent with benign endometrial polyp.  [name] after polypectomy due to anxiety associated with pelvic exams.  pelvic exam on 08/16/2018 reviewed cervical abnormality and from enlarged uterus. Seen by Dr. Valentino Yoder on 10/29/2018.  Endometrial biopsy and a Pap smear was performed. 10/29/2018 Pap smear showed adenocarcinoma, favor endometrial origin. 10/29/2018 endometrial biopsy showed endometrioid carcinoma, FIGO grade 1.  10/29/2018- TA &  TV  Ultrasound revealed: Anteverted uterus measuring 8.7 x 5.6 x 6.4 cm without evidence of focal masses.  The endometrium measuring 24.1 mm (thickened) and heterogeneous.  Right and left ovaries not visualized.  No adnexal masses identified.  No free fluid in cul-de-sac.  Patient was seen by Dr. Sonia Yoder in clinic on 11/13/2018.  Cervical exam reveals 2 cm exophytic irregular mass consistent with malignancy.   11/19/2018 CT chest abdomen pelvis with contrast showed thickened endometrium with some irregularity compatible with the provided diagnosis of endometrial malignancy.  There is a mildly prominent left inguinal node 1.4 cm.  Patient was seen by Dr. Johnnette Yoder on 11/20/2018 and left groin lymph node biopsy was recommended.  11/26/2018 patient underwent left inguinal lymph node biopsy. Pathology showed metastatic adenocarcinoma consistent with colorectal origin.  CDX 2+.  Case was discussed on tumor board.  Recommend colonoscopy for further evaluation.  Patient reports significant weight loss 30 pounds over the last year.  Chronic vaginal spotting. Change of bowel habits the past few months.  More constipated.  Family history positive for brother who has colon cancer prostate cancer.  patient has underwent colonoscopy on 12/03/2018 which reviewed a nonobstructing large mass in the rectum.  Also chronic fistula.  Mass was not circumferential.  This was biopsied with a cold forceps for histology.  Pathology came back hyperplastic polyp negative for dysplasia and malignancy. [name], patient underwent flex sigmoidoscopy on 12/06/2018 with rebiopsy of the rectal mass. This time biopsy results came back positive for invasive colorectal adenocarcinoma, moderately differentiated. Immunotherapy for nearly mismatch repair protein (MMR ) was performed.  There is no loss of MMR expression.  low probability of MSI high.   # Seen by [name] for evaluation of resectability for  rectal cancer. In addition, she also had a second opinion with [facility] pathology where her endometrial biopsy pathology was changed to  adenocarcinoma, consistent with colorectal primary.   [name]. She has home health that has been assisting with ostomy care  Patient was also evaluated by [facility] oncology.  Recommendation is to proceed with TNT with concurrent chemoradiation followed by neoadjuvant chemotherapy followed by surgical resection. Patient prefers to have treatment done locally with [facility].   # Oncology Treatment:  02/03/2019- 03/19/2019  concurrent Xeloda and radiation.  Xeloda dose 825mg  /m2 BID - rounded to 1650mg  BID- on days of radiation. 04/09/2019, started on FOLFOX with bolus early.  Omitted.  07/16/2019 finished 8 cycles of FOLFOX. 09/17/19 APR/posterior vaginectomy/TAH/BSO/VY-flap pT4b pN0 with close vaginal margin 0.2 mm.  Uterus and ovaries negative for malignancy. Patient reports bilateral lower extremity numbness and tingling, intermittent, left worse than right. She has lost a lot of weight since her APR surgery.   #Family history with half brother having's history of colon cancer prostate cancer.  Personal history of colorectal cancer.  Patient has not decided if she wants genetic testing.    # history of PE( 01/13/2020)  in the bilateral lower extremity DVT (01/13/2020).   She finishes 6 months of anticoagulation with Eliquis 5 mg twice daily. Now switched to Eliquis 2.5 mg twice daily..  # She has now developed recurrent disease. #06/30/20  vaginal introitus mass biopsied. Pathology is consistent with metastatic colorectal adenocarcinoma I have discussed with [name]  Dr. Luciano Cutter and the mass is not resectable. Patient has also had colonoscopy by Dr. Tobi Bastos yesterday. Normal examination. # 07/16/2020 cycle 1 FOLFIRI  # 07/20/2020 PET scan was done for  further evaluation, images are consistent with local recurrence, no distant metastasis. #Discussed  with radiation oncology Dr. Rushie Chestnut will recommends concurrent chemotherapy and radiation. 08/02/2020-08/16/2020, patient starts radiation.  Xeloda was held due to neutropenia 08/17/2020,-09/06/2020 Xeloda 1500 mg twice daily concurrently with radiation  01/31/21 started on FOLFIRI + Bev 05/18/2021 CT chest abdomen pelvis showed Previously noted enlargement of bilateral inguinal lymph nodes is resolved, consistent with treatment response of nodal metastatic disease. Interval decrease in size of multiple small bilateral pulmonary nodules, consistent with treatment response of pulmonary metastatic disease. No evidence of new metastatic disease. 05/24/2021 - 08/30/2021, continued on FOLFIRI plus bevacizumab.  Irinotecan dose was reduced, eventually 100mg /m2  09/02/2021, CT chest abdomen pelvis without contrast Showed small bilateral pulmonary nodules, unchanged.  Stable metastatic disease.  No noncontrast evidence of new metastatic disease in the chest abdomen pelvis.  Small parastomal hernia.  Enlargement of main pulmonary artery.  Coronary artery disease.  09/13/2021, maintenance 5-FU/bevacizumab 11/28/2021, 5-FU/Irinotecan/bevacizumab.  Irinotecan 100 mg/m2 was added back due to progressively increasing CEA.  -right lower extremity edema, US showed non occlusive DVT, edema has improved.   INTERVAL HISTORY Jacqueline Yoder is a 77 y.o. female who has above history reviewed by me presents for follow-up of rectal cancer. On Irinotecan and panitumumab. she tolerates well.   She was accompanied by her daughter. - chronic diarrhea is manageable.  She take Lomotil PRN, usually 1-2 time per day.  + fatigue.   Review of Systems  Constitutional:  Positive for fatigue. Negative for appetite change, chills, fever and unexpected weight change.  HENT:   Negative for hearing loss and voice change.   Eyes:  Negative for eye problems.  Respiratory:  Negative for chest tightness and cough.   Cardiovascular:   Negative for chest pain.  Gastrointestinal:  Positive for diarrhea. Negative for abdominal distention, abdominal pain, blood in stool, constipation and nausea.  Endocrine: Negative for hot flashes.  Genitourinary:  Positive for vaginal bleeding. Negative for difficulty urinating and frequency.   Musculoskeletal:  Positive for arthralgias.  Skin:  Negative for itching.  Neurological:  Negative for extremity weakness and numbness.  Hematological:  Negative for adenopathy.  Psychiatric/Behavioral:  Negative for confusion.     MEDICAL HISTORY:  Past Medical History:  Diagnosis Date   Allergy    Arthritis    Blood clot in vein    Family history of colon cancer    GERD (gastroesophageal reflux disease)    Hypercholesteremia    Hypertension    Hypertension    Lower extremity edema    Personal history of chemotherapy    Rectal cancer (HCC) 12/2018   Urinary incontinence     SURGICAL HISTORY: Past Surgical History:  Procedure Laterality Date   ABDOMINAL HYSTERECTOMY     CHOLECYSTECTOMY  1971   COLONOSCOPY WITH PROPOFOL N/A 12/03/2018   Procedure: COLONOSCOPY WITH PROPOFOL;  Surgeon: Midge Minium, MD;  Location: [facility] ENDOSCOPY;  Service: Endoscopy;  Laterality: N/A;   COLONOSCOPY WITH PROPOFOL N/A 07/15/2020   Procedure: COLONOSCOPY WITH PROPOFOL;  Surgeon: Wyline Mood, MD;  Location: [facility] ENDOSCOPY;  Service: Gastroenterology;  Laterality: N/A;   FLEXIBLE SIGMOIDOSCOPY N/A 12/06/2018   Procedure: FLEXIBLE SIGMOIDOSCOPY;  Surgeon: Wyline Mood, MD;  Location: [facility] ENDOSCOPY;  Service: Endoscopy;  Laterality: N/A;   LAPAROSCOPIC COLOSTOMY  01/06/2019   PORTACATH PLACEMENT N/A 04/03/2019   Procedure: INSERTION PORT-A-CATH;  Surgeon: Leafy Ro, MD;  Location: [facility] ORS;  Service: General;  Laterality: N/A;    SOCIAL HISTORY: Social  History   Socioeconomic History   Marital status: Widowed    Spouse name: Not on file   Number of children: Not on file   Years of education: Not on  file   Highest education level: Not on file  Occupational History   Not on file  Tobacco Use   Smoking status: Former    Current packs/day: 0.00    Types: Cigarettes    Quit date: 12/02/1977    Years since quitting: 45.6   Smokeless tobacco: Former  Building services engineer status: Never Used  Substance and Sexual Activity   Alcohol use: Never   Drug use: Never   Sexual activity: Not Currently    Birth control/protection: None  Other Topics Concern   Not on file  Social History Narrative   Lives with daughter   Social Determinants of Health   Financial Resource Strain: Medium Risk (06/30/2022)   Overall Financial Resource Strain (CARDIA)    Difficulty of Paying Living Expenses: Somewhat hard  Food Insecurity: No Food Insecurity (06/30/2022)   Hunger Vital Sign    Worried About Running Out of Food in the Last Year: Never true    Ran Out of Food in the Last Year: Never true  Transportation Needs: No Transportation Needs (06/30/2022)   PRAPARE - Administrator, Civil Service (Medical): No    Lack of Transportation (Non-Medical): No  Physical Activity: Inactive (06/30/2022)   Exercise Vital Sign    Days of Exercise per Week: 0 days    Minutes of Exercise per Session: 0 min  Stress: No Stress Concern Present (06/30/2022)   [employer] of Occupational Health - Occupational Stress Questionnaire    Feeling of Stress : Not at all  Social Connections: Moderately Isolated (06/30/2022)   Social Connection and Isolation Panel [NHANES]    Frequency of Communication with Friends and Family: More than three times a week    Frequency of Social Gatherings with Friends and Family: Twice a week    Attends Religious Services: More than 4 times per year    Active Member of [employer] or Organizations: No    Attends Banker Meetings: Never    Marital Status: Widowed  Intimate Partner Violence: Not At Risk (06/30/2022)   Humiliation, Afraid, Rape, and Kick questionnaire     Fear of Current or Ex-Partner: No    Emotionally Abused: No    Physically Abused: No    Sexually Abused: No    FAMILY HISTORY: Family History  Problem Relation Age of Onset   Colon cancer Brother 46       exposure to chemicals [place]   Hypertension Mother    Stroke Mother    Kidney failure Father    Breast cancer Neg Hx    Ovarian cancer Neg Hx     ALLERGIES:  is allergic to sulfamethoxazole-trimethoprim.  MEDICATIONS:  Current Outpatient Medications  Medication Sig Dispense Refill   Cholecalciferol (VITAMIN D3) 2000 units capsule Take 2,000 Units by mouth daily.     clindamycin (CLINDAGEL) 1 % gel Apply topically 2 (two) times daily. 30 g 5   diclofenac sodium (VOLTAREN) 1 % GEL Apply 2 g topically 4 (four) times daily as needed (joint pain).  11   diphenoxylate-atropine (LOMOTIL) 2.5-0.025 MG tablet Take 1 tablet by mouth 4 (four) times daily as needed for diarrhea or loose stools. 120 tablet 0   ELIQUIS 2.5 MG TABS tablet TAKE 1 TABLET BY MOUTH TWICE  DAILY 200 tablet  2   fluticasone (FLONASE) 50 MCG/ACT nasal spray USE 1 SPRAY IN EACH NOSTRIL ONCE DAILY 16 g 3   ipratropium (ATROVENT) 0.03 % nasal spray Place 1 spray into both nostrils 2 (two) times daily.     ketoconazole (NIZORAL) 2 % cream Apply 1 Application topically daily. For up to 2 weeks. May repeat if need. 30 g 2   lidocaine-prilocaine (EMLA) cream Apply 1 application. topically as needed. 30 g 6   loperamide (IMODIUM) 2 MG capsule Take 1 capsule (2 mg total) by mouth See admin instructions. With onset of loose stool, take 4mg  followed by 2mg  every 2 hours,  Maximum: 16 mg/day 120 capsule 1   loratadine (CLARITIN) 10 MG tablet Take 10 mg by mouth daily.     magnesium chloride (SLOW-MAG) 64 MG TBEC SR tablet Take 1 tablet (64 mg total) by mouth 2 (two) times daily. 60 tablet 2   Multiple Vitamins-Minerals (ONE-A-DAY WOMENS 50 PLUS PO) Take 1 tablet by mouth daily.      nystatin (MYCOSTATIN/NYSTOP) powder APPLY 1  POWDER TOPICALLY THREE TIMES DAILY 45 g 0   potassium chloride SA (KLOR-CON M) 20 MEQ tablet TAKE 1 TABLET BY MOUTH DAILY 100 tablet 2   simvastatin (ZOCOR) 40 MG tablet Take 1 tablet (40 mg total) by mouth at bedtime. 100 tablet 3   triamcinolone cream (KENALOG) 0.5 % Apply 1 Application topically 2 (two) times daily. To affected areas, for up to 2 weeks. 30 g 2   triamterene-hydrochlorothiazide (DYAZIDE) 37.5-25 MG capsule Take 1 each (1 capsule total) by mouth daily. 100 capsule 3   zinc gluconate 50 MG tablet Take 50 mg by mouth daily.     No current facility-administered medications for this visit.   Facility-Administered Medications Ordered in Other Visits  Medication Dose Route Frequency Provider Last Rate Last Admin   heparin lock flush 100 UNIT/ML injection            heparin lock flush 100 unit/mL  500 Units Intracatheter Once PRN Jacqueline Patience, MD       irinotecan (CAMPTOSAR) 180 mg in sodium chloride 0.9 % 500 mL chemo infusion  100 mg/m2 (Treatment Plan Recorded) Intravenous Once Jacqueline Patience, MD 339 mL/hr at 07/03/23 1211 180 mg at 07/03/23 1211   magnesium sulfate IVPB 4 g 100 mL  4 g Intravenous Daily PRN Jacqueline Patience, MD 50 mL/hr at 07/03/23 1014 4 g at 07/03/23 1014     PHYSICAL EXAMINATION: ECOG PERFORMANCE STATUS: 1 - Symptomatic but completely ambulatory  Physical Exam Constitutional:      General: She is not in acute distress. HENT:     Head: Normocephalic and atraumatic.  Eyes:     General: No scleral icterus. Cardiovascular:     Rate and Rhythm: Normal rate.     Heart sounds: Normal heart sounds.  Pulmonary:     Effort: Pulmonary effort is normal. No respiratory distress.     Breath sounds: No wheezing.  Abdominal:     General: Bowel sounds are normal. There is no distension.     Palpations: Abdomen is soft.     Comments: + Colostomy bag   Musculoskeletal:        General: No deformity. Normal range of motion.     Cervical back: Normal range of motion and neck  supple.  Skin:    General: Skin is warm and dry.  Neurological:     Mental Status: She is alert and oriented to person, place, and time. Mental  status is at baseline.     Cranial Nerves: No cranial nerve deficit.     Coordination: Coordination normal.       LABORATORY DATA:  I have reviewed the data as listed    Latest Ref Rng & Units 07/03/2023    8:44 AM 06/19/2023    8:14 AM 06/05/2023    8:34 AM  CBC  WBC 4.0 - 10.5 K/uL 6.2  7.2  6.1   Hemoglobin 12.0 - 15.0 g/dL 16.1  09.6  04.5   Hematocrit 36.0 - 46.0 % 36.4  36.4  36.7   Platelets 150 - 400 K/uL 222  199  185       Latest Ref Rng & Units 07/03/2023    8:44 AM 06/19/2023    8:14 AM 06/05/2023    8:34 AM  CMP  Glucose 70 - 99 mg/dL 409  811  914   BUN 8 - 23 mg/dL 13  12  14    Creatinine 0.44 - 1.00 mg/dL 7.82  9.56  2.13   Sodium 135 - 145 mmol/L 137  138  136   Potassium 3.5 - 5.1 mmol/L 3.5  3.7  3.3   Chloride 98 - 111 mmol/L 105  105  103   CO2 22 - 32 mmol/L 27  26  27    Calcium 8.9 - 10.3 mg/dL 8.7  8.7  8.7   Total Protein 6.5 - 8.1 g/dL 6.5  6.3  6.7   Total Bilirubin 0.3 - 1.2 mg/dL 0.5  0.3  0.4   Alkaline Phos 38 - 126 U/L 85  75  73   AST 15 - 41 U/L 36  37  38   ALT 0 - 44 U/L 25  25  24       RADIOGRAPHIC STUDIES: I have personally reviewed the radiological images as listed and agreed with the findings in the report. CT CHEST ABDOMEN PELVIS W CONTRAST  Result Date: 06/25/2023 CLINICAL DATA:  Follow-up metastatic rectal cancer. * Tracking Code: BO * EXAM: CT CHEST, ABDOMEN, AND PELVIS WITH CONTRAST TECHNIQUE: Multidetector CT imaging of the chest, abdomen and pelvis was performed following the standard protocol during bolus administration of intravenous contrast. RADIATION DOSE REDUCTION: This exam was performed according to the departmental dose-optimization program which includes automated exposure control, adjustment of the mA and/or kV according to patient size and/or use of iterative reconstruction  technique. CONTRAST:  85mL OMNIPAQUE IOHEXOL 300 MG/ML  SOLN COMPARISON:  Multiple priors including most recent CT March 20, 2023. FINDINGS: CT CHEST FINDINGS Cardiovascular: Right chest Port-A-Cath with tip in the right atrium. Aortic atherosclerosis. Enlarged main pulmonary artery measuring 3.6 cm. Coronary artery calcifications normal size heart. No significant pericardial effusion/thickening. Mediastinum/Nodes: No suspicious thyroid nodule. No pathologically enlarged mediastinal, hilar or axillary lymph nodes esophagus is grossly unremarkable Lungs/Pleura: Increased size of the solid bilateral pulmonary nodules. For reference: -medial right middle lobe pulmonary nodule measures 17 x 13 mm on image 102/3 previously 14 x 11 mm. No pleural effusion. No pneumothorax. Scattered atelectasis/scarring. Musculoskeletal: Thoracic spondylosis. CT ABDOMEN PELVIS FINDINGS Hepatobiliary: Stable 8 mm subcapsular lesion in the anterior inferior right hepatic lobe segment V on image 71/2 common non FDG avid on prior PET-CT stable over multiple prior examinations favored benign. No discrete suspicious hepatic lesion identified. Gallbladder surgically absent. Similar mild dilation of the biliary tree with the common duct measuring 10 mm but smoothly tapering to the level of the ampulla and favored reservoir effect post cholecystectomy Pancreas: No pancreatic  ductal dilation or evidence of acute inflammation. Spleen: No splenomegaly.  Stable 11 mm splenic cyst on image 61/2 Adrenals/Urinary Tract: Bilateral adrenal glands appear normal. No hydronephrosis stable benign bilateral renal cysts, including a 2.1 cm lesion in the lateral interpolar left kidney which measures Hounsfield units slightly greater than that of simple fluid but was previously classifies as a cyst on MRI March 16, 2020, also stable are the hypodense renal lesions technically too small to accurately characterize but statistically likely to reflect cysts. These are  considered benign and requiring no independent imaging follow-up. Urinary bladder is unremarkable for degree of distension Stomach/Bowel: Radiopaque enteric contrast material traverses the ileocecal valve. Stomach is unremarkable for degree of distension. No pathologic dilation of small or large bowel. Normal appendix. Colonic diverticulosis without findings of acute diverticulitis Prior abdominoperineal resection with mid transverse end colostomy in the left anterior abdominal wall. Fat containing peristomal hernia. Vascular/Lymphatic: Aortic atherosclerosis. Smooth IVC contours. The portal, splenic and superior mesenteric veins are patent. Previously indexed right external iliac lymph node measures 11 mm in short axis on image 100/2 previously 6 mm. Previously indexed inguinal lymph node measures 14 mm in short axis on image 107/2 previously 11 mm. Reproductive: Status post hysterectomy and bilateral salpingo-oophorectomy. Other: No significant abdominopelvic free fluid. Fat containing peristomal hernia Musculoskeletal: No significant interval change in the left iliac bone osseous metastasis on image 83/2. Multilevel degenerative changes spine. Probable postradiation change in the sacrum IMPRESSION: 1. Increased size of the solid bilateral pulmonary nodules, consistent with worsening metastatic disease. 2. Increased size of the right external iliac and inguinal lymph nodes, concerning for worsening metastatic disease. 3. No significant interval change in the left iliac bone osseous metastasis. 4. Enlarged main pulmonary artery measuring 3.6 cm, which can be seen in the setting of pulmonary arterial hypertension. 5.  Aortic Atherosclerosis (ICD10-I70.0). Electronically Signed   By: Maudry Mayhew M.D.   On: 06/25/2023 11:35   US Venous Img Lower Unilateral Right  Result Date: 05/18/2023 CLINICAL DATA:  Right lower extremity swelling EXAM: RIGHT LOWER EXTREMITY VENOUS DOPPLER ULTRASOUND TECHNIQUE: Gray-scale  sonography with compression, as well as color and duplex ultrasound, were performed to evaluate the deep venous system(s) from the level of the common femoral vein through the popliteal and proximal calf veins. COMPARISON:  01/13/2020 FINDINGS: VENOUS Small amount of nonocclusive thrombus noted in the right posterior tibial vein. The thrombus is echogenic and eccentric which suggests that it is chronic. Otherwise normal compressibility of the common femoral, superficial femoral, and popliteal veins, as well as the visualized calf veins. Visualized portions of profunda femoral vein and great saphenous vein unremarkable. No additional filling defects to suggest DVT on grayscale or color Doppler imaging. Doppler waveforms show normal direction of venous flow, normal respiratory plasticity and response to augmentation. Limited views of the contralateral common femoral vein are unremarkable. OTHER 3.9 x 1.1 x 1.2 cm fluid collection in the right popliteal fossa likely Baker's cyst. Limitations: none IMPRESSION: Small amount of chronic nonocclusive DVT noted in the right posterior tibial vein. Electronically Signed   By: Acquanetta Belling M.D.   On: 05/18/2023 14:36      CT CHEST ABDOMEN PELVIS W CONTRAST  Result Date: 06/25/2023 CLINICAL DATA:  Follow-up metastatic rectal cancer. * Tracking Code: BO * EXAM: CT CHEST, ABDOMEN, AND PELVIS WITH CONTRAST TECHNIQUE: Multidetector CT imaging of the chest, abdomen and pelvis was performed following the standard protocol during bolus administration of intravenous contrast. RADIATION DOSE REDUCTION: This exam was  performed according to the departmental dose-optimization program which includes automated exposure control, adjustment of the mA and/or kV according to patient size and/or use of iterative reconstruction technique. CONTRAST:  85mL OMNIPAQUE IOHEXOL 300 MG/ML  SOLN COMPARISON:  Multiple priors including most recent CT March 20, 2023. FINDINGS: CT CHEST FINDINGS  Cardiovascular: Right chest Port-A-Cath with tip in the right atrium. Aortic atherosclerosis. Enlarged main pulmonary artery measuring 3.6 cm. Coronary artery calcifications normal size heart. No significant pericardial effusion/thickening. Mediastinum/Nodes: No suspicious thyroid nodule. No pathologically enlarged mediastinal, hilar or axillary lymph nodes esophagus is grossly unremarkable Lungs/Pleura: Increased size of the solid bilateral pulmonary nodules. For reference: -medial right middle lobe pulmonary nodule measures 17 x 13 mm on image 102/3 previously 14 x 11 mm. No pleural effusion. No pneumothorax. Scattered atelectasis/scarring. Musculoskeletal: Thoracic spondylosis. CT ABDOMEN PELVIS FINDINGS Hepatobiliary: Stable 8 mm subcapsular lesion in the anterior inferior right hepatic lobe segment V on image 71/2 common non FDG avid on prior PET-CT stable over multiple prior examinations favored benign. No discrete suspicious hepatic lesion identified. Gallbladder surgically absent. Similar mild dilation of the biliary tree with the common duct measuring 10 mm but smoothly tapering to the level of the ampulla and favored reservoir effect post cholecystectomy Pancreas: No pancreatic ductal dilation or evidence of acute inflammation. Spleen: No splenomegaly.  Stable 11 mm splenic cyst on image 61/2 Adrenals/Urinary Tract: Bilateral adrenal glands appear normal. No hydronephrosis stable benign bilateral renal cysts, including a 2.1 cm lesion in the lateral interpolar left kidney which measures Hounsfield units slightly greater than that of simple fluid but was previously classifies as a cyst on MRI March 16, 2020, also stable are the hypodense renal lesions technically too small to accurately characterize but statistically likely to reflect cysts. These are considered benign and requiring no independent imaging follow-up. Urinary bladder is unremarkable for degree of distension Stomach/Bowel: Radiopaque enteric  contrast material traverses the ileocecal valve. Stomach is unremarkable for degree of distension. No pathologic dilation of small or large bowel. Normal appendix. Colonic diverticulosis without findings of acute diverticulitis Prior abdominoperineal resection with mid transverse end colostomy in the left anterior abdominal wall. Fat containing peristomal hernia. Vascular/Lymphatic: Aortic atherosclerosis. Smooth IVC contours. The portal, splenic and superior mesenteric veins are patent. Previously indexed right external iliac lymph node measures 11 mm in short axis on image 100/2 previously 6 mm. Previously indexed inguinal lymph node measures 14 mm in short axis on image 107/2 previously 11 mm. Reproductive: Status post hysterectomy and bilateral salpingo-oophorectomy. Other: No significant abdominopelvic free fluid. Fat containing peristomal hernia Musculoskeletal: No significant interval change in the left iliac bone osseous metastasis on image 83/2. Multilevel degenerative changes spine. Probable postradiation change in the sacrum IMPRESSION: 1. Increased size of the solid bilateral pulmonary nodules, consistent with worsening metastatic disease. 2. Increased size of the right external iliac and inguinal lymph nodes, concerning for worsening metastatic disease. 3. No significant interval change in the left iliac bone osseous metastasis. 4. Enlarged main pulmonary artery measuring 3.6 cm, which can be seen in the setting of pulmonary arterial hypertension. 5.  Aortic Atherosclerosis (ICD10-I70.0). Electronically Signed   By: Maudry Mayhew M.D.   On: 06/25/2023 11:35   US Venous Img Lower Unilateral Right  Result Date: 05/18/2023 CLINICAL DATA:  Right lower extremity swelling EXAM: RIGHT LOWER EXTREMITY VENOUS DOPPLER ULTRASOUND TECHNIQUE: Gray-scale sonography with compression, as well as color and duplex ultrasound, were performed to evaluate the deep venous system(s) from the level of the common  femoral  vein through the popliteal and proximal calf veins. COMPARISON:  01/13/2020 FINDINGS: VENOUS Small amount of nonocclusive thrombus noted in the right posterior tibial vein. The thrombus is echogenic and eccentric which suggests that it is chronic. Otherwise normal compressibility of the common femoral, superficial femoral, and popliteal veins, as well as the visualized calf veins. Visualized portions of profunda femoral vein and great saphenous vein unremarkable. No additional filling defects to suggest DVT on grayscale or color Doppler imaging. Doppler waveforms show normal direction of venous flow, normal respiratory plasticity and response to augmentation. Limited views of the contralateral common femoral vein are unremarkable. OTHER 3.9 x 1.1 x 1.2 cm fluid collection in the right popliteal fossa likely Baker's cyst. Limitations: none IMPRESSION: Small amount of chronic nonocclusive DVT noted in the right posterior tibial vein. Electronically Signed   By: Acquanetta Belling M.D.   On: 05/18/2023 14:36

## 2023-07-03 NOTE — Assessment & Plan Note (Signed)
Continue potassium chloride 20 mEq daily.

## 2023-07-03 NOTE — Assessment & Plan Note (Signed)
Chemotherapy plan as listed above 

## 2023-07-03 NOTE — Assessment & Plan Note (Addendum)
History of stage IIIC Rectal cancer, s/p TNT, followed by 09/17/19 APR/posterior vaginectomy/TAH/BSO/VY-flap, pT4b pN0 with close vaginal margin 0.2 mm.  Uterus and ovaries negative for malignancy. palliative radiation to vaginal recurrence- 01/19/21 recurrence with lung metastasis.-Palliative -FOLFIRI plus bevacizumab.  Irinotecan was dropped in November 2022 due to side effects. Negative for UGT1A1*28 - radiographically stable, rise of CEA-July 2023 CT lung metastasis worse--> Dec 2023 PET showed progression in pelvic lymph nodes and bone lesions,--> 2nd line irinotecan +panitumumab-->CT March 2024 stable. --> June 2024 CT showed mild lung progression/Progressive left iliac bone metastasis    Labs are reviewed and discussed with patient, CEA nadir was in 490, currently gradually increasing, 1402, she asymptomatic.  Proceed with irinotecan +panitumumab   vulvar mass difficult for biopsy in office,  Tempus Liquid biopsy - no actionable variants  06/25/2023 CT showed progression of lung nodules [SLD increase 21mm] and iliac inguinal lymphadenopathy.  Consider switch to FOLFOX regimen in the future. Will decide regimen after radiation plan is finalized. Depending on PET scan, Dr. Eliseo Squires will decide if he will offer her any RT.

## 2023-07-03 NOTE — Assessment & Plan Note (Signed)
patient will receive long-acting GCSF prophylaxis on D3

## 2023-07-03 NOTE — Patient Instructions (Signed)
[facility]  Discharge Instructions: Thank you for choosing [facility] to provide your oncology and hematology care.  If you have a lab appointment with the Cancer Center, please go directly to the Cancer Center and check in at the registration area.  Wear comfortable clothing and clothing appropriate for easy access to any Portacath or PICC line.   We strive to give you quality time with your provider. You may need to reschedule your appointment if you arrive late (15 or more minutes).  Arriving late affects you and other patients whose appointments are after yours.  Also, if you miss three or more appointments without notifying the office, you may be dismissed from the clinic at the provider's discretion.      For prescription refill requests, have your pharmacy contact our office and allow 72 hours for refills to be completed.    Today you received the following chemotherapy and/or immunotherapy agents Vectibix & Irinotecan    To help prevent nausea and vomiting after your treatment, we encourage you to take your nausea medication as directed.  BELOW ARE SYMPTOMS THAT SHOULD BE REPORTED IMMEDIATELY: *FEVER GREATER THAN 100.4 F (38 C) OR HIGHER *CHILLS OR SWEATING *NAUSEA AND VOMITING THAT IS NOT CONTROLLED WITH YOUR NAUSEA MEDICATION *UNUSUAL SHORTNESS OF BREATH *UNUSUAL BRUISING OR BLEEDING *URINARY PROBLEMS (pain or burning when urinating, or frequent urination) *BOWEL PROBLEMS (unusual diarrhea, constipation, pain near the anus) TENDERNESS IN MOUTH AND THROAT WITH OR WITHOUT PRESENCE OF ULCERS (sore throat, sores in mouth, or a toothache) UNUSUAL RASH, SWELLING OR PAIN  UNUSUAL VAGINAL DISCHARGE OR ITCHING   Items with * indicate a potential emergency and should be followed up as soon as possible or go to the Emergency Department if any problems should occur.  Please show the CHEMOTHERAPY ALERT CARD or IMMUNOTHERAPY ALERT CARD at  check-in to the Emergency Department and triage nurse.  Should you have questions after your visit or need to cancel or reschedule your appointment, please contact [facility]  [phone] and follow the prompts.  Office hours are 8:00 a.m. to 4:30 p.m. Monday - Friday. Please note that voicemails left after 4:00 p.m. may not be returned until the following business day.  We are closed weekends and major holidays. You have access to a nurse at all times for urgent questions. Please call the main number to the clinic [phone] and follow the prompts.  For any non-urgent questions, you may also contact your provider using MyChart. We now offer e-Visits for anyone 78 and older to request care online for non-urgent symptoms. For details visit [email or web address].   Also download the MyChart app! Go to the app store, search "MyChart", open the app, select [place], and log in with your MyChart username and password.

## 2023-07-03 NOTE — Assessment & Plan Note (Signed)
Hb stable observation

## 2023-07-03 NOTE — Assessment & Plan Note (Signed)
continue lomotil  QID PRN as instructed.

## 2023-07-03 NOTE — Assessment & Plan Note (Signed)
If magnesium is 1.0 - 1.2:  administrate 6 gm IV magnesium sulfate If magnesium is 1.2-1.4, administrate 4 gm IV magnesium sulfate If magnesium is 1.5-1.6:  administrate 2 gm IV magnesium sulfate.  Continue slow mag orally 64mg  twice daily.

## 2023-07-03 NOTE — Assessment & Plan Note (Signed)
History of PE and bilateral lower extremity DVT  05/18/23 Repeat right LE US showed chronic non occlusive DVT Continue  Eliquis 2.5 mg twice daily for anticoagulation prophylaxis. Recommend leg elevation and compression stocking.

## 2023-07-04 ENCOUNTER — Other Ambulatory Visit: Payer: 59

## 2023-07-04 DIAGNOSIS — I129 Hypertensive chronic kidney disease with stage 1 through stage 4 chronic kidney disease, or unspecified chronic kidney disease: Secondary | ICD-10-CM

## 2023-07-04 DIAGNOSIS — R7309 Other abnormal glucose: Secondary | ICD-10-CM | POA: Diagnosis not present

## 2023-07-04 DIAGNOSIS — N182 Chronic kidney disease, stage 2 (mild): Secondary | ICD-10-CM | POA: Diagnosis not present

## 2023-07-04 DIAGNOSIS — E782 Mixed hyperlipidemia: Secondary | ICD-10-CM | POA: Diagnosis not present

## 2023-07-05 ENCOUNTER — Inpatient Hospital Stay: Payer: 59

## 2023-07-05 DIAGNOSIS — T451X5A Adverse effect of antineoplastic and immunosuppressive drugs, initial encounter: Secondary | ICD-10-CM | POA: Diagnosis not present

## 2023-07-05 DIAGNOSIS — C7951 Secondary malignant neoplasm of bone: Secondary | ICD-10-CM | POA: Diagnosis not present

## 2023-07-05 DIAGNOSIS — Z5111 Encounter for antineoplastic chemotherapy: Secondary | ICD-10-CM | POA: Diagnosis not present

## 2023-07-05 DIAGNOSIS — D6481 Anemia due to antineoplastic chemotherapy: Secondary | ICD-10-CM | POA: Diagnosis not present

## 2023-07-05 DIAGNOSIS — C20 Malignant neoplasm of rectum: Secondary | ICD-10-CM | POA: Diagnosis not present

## 2023-07-05 DIAGNOSIS — D701 Agranulocytosis secondary to cancer chemotherapy: Secondary | ICD-10-CM | POA: Diagnosis not present

## 2023-07-05 DIAGNOSIS — Z87891 Personal history of nicotine dependence: Secondary | ICD-10-CM | POA: Diagnosis not present

## 2023-07-05 DIAGNOSIS — E876 Hypokalemia: Secondary | ICD-10-CM | POA: Diagnosis not present

## 2023-07-05 DIAGNOSIS — C799 Secondary malignant neoplasm of unspecified site: Secondary | ICD-10-CM

## 2023-07-05 DIAGNOSIS — B372 Candidiasis of skin and nail: Secondary | ICD-10-CM | POA: Diagnosis not present

## 2023-07-05 DIAGNOSIS — Z79899 Other long term (current) drug therapy: Secondary | ICD-10-CM | POA: Diagnosis not present

## 2023-07-05 DIAGNOSIS — R197 Diarrhea, unspecified: Secondary | ICD-10-CM | POA: Diagnosis not present

## 2023-07-05 DIAGNOSIS — I10 Essential (primary) hypertension: Secondary | ICD-10-CM | POA: Diagnosis not present

## 2023-07-05 DIAGNOSIS — Z86711 Personal history of pulmonary embolism: Secondary | ICD-10-CM | POA: Diagnosis not present

## 2023-07-05 DIAGNOSIS — L89152 Pressure ulcer of sacral region, stage 2: Secondary | ICD-10-CM | POA: Diagnosis not present

## 2023-07-05 DIAGNOSIS — Z7901 Long term (current) use of anticoagulants: Secondary | ICD-10-CM | POA: Diagnosis not present

## 2023-07-05 DIAGNOSIS — Z556 Problems related to health literacy: Secondary | ICD-10-CM | POA: Diagnosis not present

## 2023-07-05 DIAGNOSIS — Z5189 Encounter for other specified aftercare: Secondary | ICD-10-CM | POA: Diagnosis not present

## 2023-07-05 DIAGNOSIS — C7800 Secondary malignant neoplasm of unspecified lung: Secondary | ICD-10-CM | POA: Diagnosis not present

## 2023-07-05 LAB — HEMOGLOBIN A1C
Hgb A1c MFr Bld: 5.1 % of total Hgb (ref <5.7)
Mean Plasma Glucose: 100 mg/dL
eAG (mmol/L): 5.5 mmol/L

## 2023-07-05 LAB — TSH: TSH: 0.61 mIU/L (ref 0.40–4.50)

## 2023-07-05 LAB — LIPID PANEL
Cholesterol: 166 mg/dL (ref <200)
HDL: 70 mg/dL (ref >=50)
LDL Cholesterol (Calc): 82 mg/dL (calc)
Non-HDL Cholesterol (Calc): 96 mg/dL (calc) (ref <130)
Total CHOL/HDL Ratio: 2.4 (calc) (ref <5.0)
Triglycerides: 55 mg/dL (ref <150)

## 2023-07-05 LAB — T4, FREE: Free T4: 1.1 ng/dL (ref 0.8–1.8)

## 2023-07-05 MED ORDER — PEGFILGRASTIM INJECTION 6 MG/0.6ML ~~LOC~~
6.0000 mg | PREFILLED_SYRINGE | Freq: Once | SUBCUTANEOUS | Status: AC
  Administered 2023-07-05: 6 mg via SUBCUTANEOUS
  Filled 2023-07-05: qty 0.6

## 2023-07-06 ENCOUNTER — Ambulatory Visit (INDEPENDENT_AMBULATORY_CARE_PROVIDER_SITE_OTHER): Payer: 59

## 2023-07-06 VITALS — BP 128/64 | Ht 64.0 in | Wt 163.0 lb

## 2023-07-06 DIAGNOSIS — Z0000 Encounter for general adult medical examination without abnormal findings: Secondary | ICD-10-CM

## 2023-07-06 DIAGNOSIS — Z780 Asymptomatic menopausal state: Secondary | ICD-10-CM

## 2023-07-06 NOTE — Progress Notes (Signed)
Subjective:   Jacqueline Yoder is a 77 y.o. female who presents for Medicare Annual (Subsequent) preventive examination.  Visit Complete: In person   Cardiac Risk Factors include: advanced age (>76men, >90 women);dyslipidemia;hypertension;sedentary lifestyle     Objective:    Today's Vitals   07/06/23 1028 07/06/23 1032  BP: 128/64   Weight: 163 lb (73.9 kg)   Height: 5\' 4"  (1.626 m)   PainSc:  5    Body mass index is 27.98 kg/m.     07/06/2023   10:37 AM 06/19/2023    8:33 AM 05/22/2023    8:43 AM 05/08/2023    8:40 AM 04/24/2023    9:01 AM 04/23/2023    1:52 PM 04/10/2023    9:01 AM  Advanced Directives  Does Patient Have a Medical Advance Directive? Yes No No No Yes Yes Yes  Type of Estate agent of [place];Living will Healthcare Power of [employer] Power of [employer] Power of Attorney Living will;Healthcare Power of Attorney Living will;Healthcare Power of [employer] Power of [place];Living will  Does patient want to make changes to medical advance directive? No - Patient declined   No - Patient declined  Yes (ED - Information included in AVS) No - Patient declined  Copy of Healthcare Power of Attorney in Chart? Yes - validated most recent copy scanned in chart (See row information) Yes - validated most recent copy scanned in chart (See row information) Yes - validated most recent copy scanned in chart (See row information) Yes - validated most recent copy scanned in chart (See row information) Yes - validated most recent copy scanned in chart (See row information)  Yes - validated most recent copy scanned in chart (See row information)  Would patient like information on creating a medical advance directive? No - Patient declined Yes (ED - Information included in AVS)   Yes (ED - Information included in AVS) Yes (ED - Information included in AVS) No - Patient declined    Current Medications (verified) Outpatient Encounter  Medications as of 07/06/2023  Medication Sig   Cholecalciferol (VITAMIN D3) 2000 units capsule Take 2,000 Units by mouth daily.   clindamycin (CLINDAGEL) 1 % gel Apply topically 2 (two) times daily.   diclofenac sodium (VOLTAREN) 1 % GEL Apply 2 g topically 4 (four) times daily as needed (joint pain).   diphenoxylate-atropine (LOMOTIL) 2.5-0.025 MG tablet Take 1 tablet by mouth 4 (four) times daily as needed for diarrhea or loose stools.   ELIQUIS 2.5 MG TABS tablet TAKE 1 TABLET BY MOUTH TWICE  DAILY   fluticasone (FLONASE) 50 MCG/ACT nasal spray USE 1 SPRAY IN EACH NOSTRIL ONCE DAILY   ipratropium (ATROVENT) 0.03 % nasal spray Place 1 spray into both nostrils 2 (two) times daily.   ketoconazole (NIZORAL) 2 % cream Apply 1 Application topically daily. For up to 2 weeks. May repeat if need.   lidocaine-prilocaine (EMLA) cream Apply 1 application. topically as needed.   loperamide (IMODIUM) 2 MG capsule Take 1 capsule (2 mg total) by mouth See admin instructions. With onset of loose stool, take 4mg  followed by 2mg  every 2 hours,  Maximum: 16 mg/day   loratadine (CLARITIN) 10 MG tablet Take 10 mg by mouth daily.   magnesium chloride (SLOW-MAG) 64 MG TBEC SR tablet Take 1 tablet (64 mg total) by mouth 2 (two) times daily.   Multiple Vitamins-Minerals (ONE-A-DAY WOMENS 50 PLUS PO) Take 1 tablet by mouth daily.    nystatin (MYCOSTATIN/NYSTOP) powder APPLY  1 POWDER TOPICALLY THREE TIMES DAILY   potassium chloride SA (KLOR-CON M) 20 MEQ tablet TAKE 1 TABLET BY MOUTH DAILY   simvastatin (ZOCOR) 40 MG tablet Take 1 tablet (40 mg total) by mouth at bedtime.   triamcinolone cream (KENALOG) 0.5 % Apply 1 Application topically 2 (two) times daily. To affected areas, for up to 2 weeks.   triamterene-hydrochlorothiazide (DYAZIDE) 37.5-25 MG capsule Take 1 each (1 capsule total) by mouth daily.   zinc gluconate 50 MG tablet Take 50 mg by mouth daily.   [DISCONTINUED] prochlorperazine (COMPAZINE) 10 MG tablet  Take 1 tablet (10 mg total) by mouth every 6 (six) hours as needed (NAUSEA).   Facility-Administered Encounter Medications as of 07/06/2023  Medication   heparin lock flush 100 UNIT/ML injection    Allergies (verified) Sulfamethoxazole-trimethoprim   History: Past Medical History:  Diagnosis Date   Allergy    Arthritis    Blood clot in vein    Family history of colon cancer    GERD (gastroesophageal reflux disease)    Hypercholesteremia    Hypertension    Hypertension    Lower extremity edema    Personal history of chemotherapy    Rectal cancer (HCC) 12/2018   Urinary incontinence    Past Surgical History:  Procedure Laterality Date   ABDOMINAL HYSTERECTOMY     CHOLECYSTECTOMY  1971   COLONOSCOPY WITH PROPOFOL N/A 12/03/2018   Procedure: COLONOSCOPY WITH PROPOFOL;  Surgeon: Midge Minium, MD;  Location: [facility] ENDOSCOPY;  Service: Endoscopy;  Laterality: N/A;   COLONOSCOPY WITH PROPOFOL N/A 07/15/2020   Procedure: COLONOSCOPY WITH PROPOFOL;  Surgeon: Wyline Mood, MD;  Location: [facility] ENDOSCOPY;  Service: Gastroenterology;  Laterality: N/A;   FLEXIBLE SIGMOIDOSCOPY N/A 12/06/2018   Procedure: FLEXIBLE SIGMOIDOSCOPY;  Surgeon: Wyline Mood, MD;  Location[facility] ENDOSCOPY;  Service: Endoscopy;  Laterality: N/A;   LAPAROSCOPIC COLOSTOMY  01/06/2019   PORTACATH PLACEMENT N/A 04/03/2019   Procedure: INSERTION PORT-A-CATH;  Surgeon: Leafy Ro, MD;  Location: [facility] ORS;  Service: General;  Laterality: N/A;   Family History  Problem Relation Age of Onset   Colon cancer Brother 30       exposure to chemicals [place]   Hypertension Mother    Stroke Mother    Kidney failure Father    Breast cancer Neg Hx    Ovarian cancer Neg Hx    Social History   Socioeconomic History   Marital status: Widowed    Spouse name: Not on file   Number of children: Not on file   Years of education: Not on file   Highest education level: Not on file  Occupational History   Not on file  Tobacco Use    Smoking status: Former    Current packs/day: 0.00    Types: Cigarettes    Quit date: 12/02/1977    Years since quitting: 45.6   Smokeless tobacco: Former  Building services engineer status: Never Used  Substance and Sexual Activity   Alcohol use: Never   Drug use: Never   Sexual activity: Not Currently    Birth control/protection: None  Other Topics Concern   Not on file  Social History Narrative   Lives with daughter   Social Determinants of Health   Financial Resource Strain: Low Risk  (07/06/2023)   Overall Financial Resource Strain (CARDIA)    Difficulty of Paying Living Expenses: Not hard at all  Food Insecurity: No Food Insecurity (07/06/2023)   Hunger Vital Sign    Worried  About Running Out of Food in the Last Year: Never true    Ran Out of Food in the Last Year: Never true  Transportation Needs: No Transportation Needs (07/06/2023)   PRAPARE - Administrator, Civil Service (Medical): No    Lack of Transportation (Non-Medical): No  Physical Activity: Inactive (07/06/2023)   Exercise Vital Sign    Days of Exercise per Week: 0 days    Minutes of Exercise per Session: 0 min  Stress: No Stress Concern Present (07/06/2023)   [employer] of Occupational Health - Occupational Stress Questionnaire    Feeling of Stress : Not at all  Social Connections: Moderately Isolated (07/06/2023)   Social Connection and Isolation Panel [NHANES]    Frequency of Communication with Friends and Family: More than three times a week    Frequency of Social Gatherings with Friends and Family: More than three times a week    Attends Religious Services: More than 4 times per year    Active Member of [employer] or Organizations: No    Attends Banker Meetings: Never    Marital Status: Widowed    Tobacco Counseling Counseling given: Not Answered   Clinical Intake:  Pre-visit preparation completed: Yes  Pain : 0-10 Pain Score: 5  Pain Type: Acute pain Pain Location:  Hip Pain Orientation: Left Pain Descriptors / Indicators: Aching, Pressure Pain Onset: Today Pain Frequency: Intermittent     Nutritional Status: BMI 25 -29 Overweight Nutritional Risks: None Diabetes: No  How often do you need to have someone help you when you read instructions, pamphlets, or other written materials from your doctor or pharmacy?: 1 - Never  Interpreter Needed?: No  Information entered by :: Kennedy Bucker, LPN   Activities of Daily Living    07/06/2023   10:38 AM 06/11/2023    2:48 PM  In your present state of health, do you have any difficulty performing the following activities:  Hearing? 0 1  Vision? 0 1  Difficulty concentrating or making decisions? 0 0  Walking or climbing stairs? 0 0  Dressing or bathing? 0 1  Doing errands, shopping? 0 0  Preparing Food and eating ? N   Using the Toilet? N   In the past six months, have you accidently leaked urine? N   Do you have problems with loss of bowel control? N   Managing your Medications? N   Managing your Finances? N   Housekeeping or managing your Housekeeping? N     Patient Care Team: Smitty Cords, DO as PCP - General (Family Medicine) Benita Gutter, RN as Registered Nurse [name], MD as Consulting Physician (Hematology and Oncology) Earna Coder, MD as Consulting Physician (Oncology)  Indicate any recent Medical Services you may have received from other than [facility] providers in the past year (date may be approximate).     Assessment:   This is a routine wellness examination for [facility].  Hearing/Vision screen Hearing Screening - Comments:: No aids Vision Screening - Comments:: Wears glasses-Dr.Thurmond   Goals Addressed             This Visit's Progress    DIET - INCREASE WATER INTAKE         Depression Screen    07/06/2023   10:35 AM 06/11/2023    2:47 PM 01/03/2023    9:25 AM 06/30/2022    9:45 AM 01/02/2022    9:48 AM 06/28/2021    9:02 AM 09/13/2020  3:11 PM  PHQ 2/9 Scores  PHQ - 2 Score 0 0 0 1 0 0 0  PHQ- 9 Score 0 0 0 1 0      Fall Risk    07/06/2023   10:38 AM 06/11/2023    2:48 PM 01/03/2023    9:26 AM 06/30/2022    9:44 AM 01/02/2022    9:48 AM  Fall Risk   Falls in the past year? 0 0 0 0 0  Number falls in past yr: 0 0 0 0 0  Injury with Fall? 0 0 0 0 0  Risk for fall due to : No Fall Risks No Fall Risks No Fall Risks No Fall Risks No Fall Risks  Follow up Falls prevention discussed;Falls evaluation completed Falls evaluation completed Falls evaluation completed Falls prevention discussed Falls evaluation completed    MEDICARE RISK AT HOME: Medicare Risk at Home Any stairs in or around the home?: Yes If so, are there any without handrails?: No Home free of loose throw rugs in walkways, pet beds, electrical cords, etc?: No Adequate lighting in your home to reduce risk of falls?: Yes Life alert?: No Use of a cane, walker or w/c?: Yes (cane) Grab bars in the bathroom?: No Shower chair or bench in shower?: Yes Elevated toilet seat or a handicapped toilet?: Yes  TIMED UP AND GO:  Was the test performed?  Yes  Length of time to ambulate 10 feet: 4 sec Gait steady and fast with assistive device    Cognitive Function:        07/06/2023   10:40 AM 06/30/2022    9:57 AM 06/28/2021    9:05 AM  6CIT Screen  What Year? 0 points 0 points 0 points  What month? 0 points 0 points 0 points  What time? 0 points 0 points 3 points  Count back from 20 0 points 0 points 0 points  Months in reverse 0 points 0 points 0 points  Repeat phrase 0 points 0 points 4 points  Total Score 0 points 0 points 7 points    Immunizations Immunization History  Administered Date(s) Administered   Fluad Quad(high Dose 65+) 07/23/2020, 06/27/2021, 07/05/2022   Influenza Inj Mdck Quad Pf 10/03/2016, 08/26/2019   Influenza Split 10/01/2015   Influenza-Unspecified 08/08/2017, 07/30/2018, 07/18/2019   PFIZER Comirnaty(Gray Top)Covid-19  Tri-Sucrose Vaccine 12/01/2019, 12/23/2019   PFIZER(Purple Top)SARS-COV-2 Vaccination 12/01/2019, 12/23/2019, 08/02/2020   Pneumococcal Conjugate-13 10/24/2016   Pneumococcal Polysaccharide-23 03/30/2015    TDAP status: Due, Education has been provided regarding the importance of this vaccine. Advised may receive this vaccine at local pharmacy or Health Dept. Aware to provide a copy of the vaccination record if obtained from local pharmacy or Health Dept. Verbalized acceptance and understanding.  Flu Vaccine status: Declined, Education has been provided regarding the importance of this vaccine but patient still declined. Advised may receive this vaccine at local pharmacy or Health Dept. Aware to provide a copy of the vaccination record if obtained from local pharmacy or Health Dept. Verbalized acceptance and understanding.  Pneumococcal vaccine status: Up to date  Covid-19 vaccine status: Completed vaccines  Qualifies for Shingles Vaccine? Yes   Zostavax completed No   Shingrix Completed?: No.    Education has been provided regarding the importance of this vaccine. Patient has been advised to call insurance company to determine out of pocket expense if they have not yet received this vaccine. Advised may also receive vaccine at local pharmacy or Health Dept. Verbalized acceptance and understanding.  Screening Tests Health Maintenance  Topic Date Due   Hepatitis C Screening  Never done   DTaP/Tdap/Td (1 - Tdap) Never done   Zoster Vaccines- Shingrix (1 of 2) Never done   DEXA SCAN  Never done   Colonoscopy  07/15/2021   COVID-19 Vaccine (6 - 2023-24 season) 06/17/2023   INFLUENZA VACCINE  08/07/2023 (Originally 05/17/2023)   Medicare Annual Wellness (AWV)  07/05/2024   Pneumonia Vaccine 99+ Years old  Completed   HPV VACCINES  Aged Out    Health Maintenance  Health Maintenance Due  Topic Date Due   Hepatitis C Screening  Never done   DTaP/Tdap/Td (1 - Tdap) Never done   Zoster  Vaccines- Shingrix (1 of 2) Never done   DEXA SCAN  Never done   Colonoscopy  07/15/2021   COVID-19 Vaccine (6 - 2023-24 season) 06/17/2023    Colorectal cancer screening: No longer required.   Mammogram status: Completed March 2024. Repeat every year  Bone Density status: Ordered 07/06/23. Pt provided with contact info and advised to call to schedule appt.  Lung Cancer Screening: (Low Dose CT Chest recommended if Age 64-80 years, 20 pack-year currently smoking OR have quit w/in 15years.) does not qualify.    Additional Screening:  Hepatitis C Screening: does qualify; Completed no  Vision Screening: Recommended annual ophthalmology exams for early detection of glaucoma and other disorders of the eye. Is the patient up to date with their annual eye exam?  Yes  Who is the provider or what is the name of the office in which the patient attends annual eye exams? Dr.Thurmond  If pt is not established with a provider, would they like to be referred to a provider to establish care? No .   Dental Screening: Recommended annual dental exams for proper oral hygiene   Community Resource Referral / Chronic Care Management: CRR required this visit?  No   CCM required this visit?  No     Plan:     I have personally reviewed and noted the following in the patient's chart:   Medical and social history Use of alcohol, tobacco or illicit drugs  Current medications and supplements including opioid prescriptions. Patient is not currently taking opioid prescriptions. Functional ability and status Nutritional status Physical activity Advanced directives List of other physicians Hospitalizations, surgeries, and ER visits in previous 12 months Vitals Screenings to include cognitive, depression, and falls Referrals and appointments  In addition, I have reviewed and discussed with patient certain preventive protocols, quality metrics, and best practice recommendations. A written personalized  care plan for preventive services as well as general preventive health recommendations were provided to patient.     Hal Hope, LPN   [id number]   After Visit Summary: my chart  Nurse Notes: none

## 2023-07-06 NOTE — Patient Instructions (Addendum)
Ms. Sammis , Thank you for taking time to come for your Medicare Wellness Visit. I appreciate your ongoing commitment to your health goals. Please review the following plan we discussed and let me know if I can assist you in the future.   Referrals/Orders/Follow-Ups/Clinician Recommendations: none  This is a list of the screening recommended for you and due dates:  Health Maintenance  Topic Date Due   Hepatitis C Screening  Never done   DTaP/Tdap/Td vaccine (1 - Tdap) Never done   Zoster (Shingles) Vaccine (1 of 2) Never done   DEXA scan (bone density measurement)  Never done   Colon Cancer Screening  07/15/2021   COVID-19 Vaccine (6 - 2023-24 season) 06/17/2023   Flu Shot  08/07/2023*   Medicare Annual Wellness Visit  07/05/2024   Pneumonia Vaccine  Completed   HPV Vaccine  Aged Out  *Topic was postponed. The date shown is not the original due date.    Advanced directives: (In Chart) A copy of your advanced directives are scanned into your chart should your provider ever need it.  Next Medicare Annual Wellness Visit scheduled for next year: Yes   07/11/24 @ 10:45 am in person

## 2023-07-09 ENCOUNTER — Ambulatory Visit
Admission: RE | Admit: 2023-07-09 | Discharge: 2023-07-09 | Disposition: A | Discharge status: Home / Self Care | Payer: 59 | Source: Ambulatory Visit | Attending: Radiation Oncology | Admitting: Radiation Oncology

## 2023-07-09 DIAGNOSIS — C7951 Secondary malignant neoplasm of bone: Secondary | ICD-10-CM | POA: Diagnosis not present

## 2023-07-09 DIAGNOSIS — C19 Malignant neoplasm of rectosigmoid junction: Secondary | ICD-10-CM | POA: Diagnosis not present

## 2023-07-09 DIAGNOSIS — I700 Atherosclerosis of aorta: Secondary | ICD-10-CM | POA: Insufficient documentation

## 2023-07-09 DIAGNOSIS — R918 Other nonspecific abnormal finding of lung field: Secondary | ICD-10-CM | POA: Diagnosis not present

## 2023-07-09 DIAGNOSIS — C20 Malignant neoplasm of rectum: Secondary | ICD-10-CM

## 2023-07-09 DIAGNOSIS — Z933 Colostomy status: Secondary | ICD-10-CM | POA: Diagnosis not present

## 2023-07-09 LAB — GLUCOSE, CAPILLARY: Glucose-Capillary: 64 mg/dL — ABNORMAL LOW (ref 70–99)

## 2023-07-09 MED ORDER — FLUDEOXYGLUCOSE F - 18 (FDG) INJECTION
8.6600 | Freq: Once | INTRAVENOUS | Status: AC | PRN
  Administered 2023-07-09: 8.66 via INTRAVENOUS

## 2023-07-10 ENCOUNTER — Encounter: Payer: Self-pay | Admitting: Oncology

## 2023-07-10 DIAGNOSIS — Z87891 Personal history of nicotine dependence: Secondary | ICD-10-CM | POA: Diagnosis not present

## 2023-07-10 DIAGNOSIS — Z7901 Long term (current) use of anticoagulants: Secondary | ICD-10-CM | POA: Diagnosis not present

## 2023-07-10 DIAGNOSIS — Z556 Problems related to health literacy: Secondary | ICD-10-CM | POA: Diagnosis not present

## 2023-07-10 DIAGNOSIS — L89152 Pressure ulcer of sacral region, stage 2: Secondary | ICD-10-CM | POA: Diagnosis not present

## 2023-07-10 DIAGNOSIS — Z79899 Other long term (current) drug therapy: Secondary | ICD-10-CM | POA: Diagnosis not present

## 2023-07-10 DIAGNOSIS — I10 Essential (primary) hypertension: Secondary | ICD-10-CM | POA: Diagnosis not present

## 2023-07-10 DIAGNOSIS — B372 Candidiasis of skin and nail: Secondary | ICD-10-CM | POA: Diagnosis not present

## 2023-07-11 ENCOUNTER — Encounter: Payer: Self-pay | Admitting: Family Medicine

## 2023-07-11 ENCOUNTER — Ambulatory Visit: Payer: 59 | Admitting: Family Medicine

## 2023-07-11 VITALS — BP 120/68 | HR 68 | Ht 64.0 in | Wt 160.0 lb

## 2023-07-11 DIAGNOSIS — I129 Hypertensive chronic kidney disease with stage 1 through stage 4 chronic kidney disease, or unspecified chronic kidney disease: Secondary | ICD-10-CM

## 2023-07-11 DIAGNOSIS — E782 Mixed hyperlipidemia: Secondary | ICD-10-CM | POA: Diagnosis not present

## 2023-07-11 DIAGNOSIS — E876 Hypokalemia: Secondary | ICD-10-CM

## 2023-07-11 DIAGNOSIS — T451X5A Adverse effect of antineoplastic and immunosuppressive drugs, initial encounter: Secondary | ICD-10-CM

## 2023-07-11 DIAGNOSIS — Z933 Colostomy status: Secondary | ICD-10-CM

## 2023-07-11 DIAGNOSIS — N182 Chronic kidney disease, stage 2 (mild): Secondary | ICD-10-CM | POA: Diagnosis not present

## 2023-07-11 DIAGNOSIS — I825Z3 Chronic embolism and thrombosis of unspecified deep veins of distal lower extremity, bilateral: Secondary | ICD-10-CM | POA: Diagnosis not present

## 2023-07-11 DIAGNOSIS — Z0000 Encounter for general adult medical examination without abnormal findings: Secondary | ICD-10-CM | POA: Diagnosis not present

## 2023-07-11 DIAGNOSIS — C799 Secondary malignant neoplasm of unspecified site: Secondary | ICD-10-CM

## 2023-07-11 DIAGNOSIS — Z23 Encounter for immunization: Secondary | ICD-10-CM

## 2023-07-11 DIAGNOSIS — D701 Agranulocytosis secondary to cancer chemotherapy: Secondary | ICD-10-CM

## 2023-07-11 MED ORDER — TRIAMTERENE-HCTZ 37.5-25 MG PO CAPS
1.0000 | ORAL_CAPSULE | Freq: Every day | ORAL | 3 refills | Status: DC
Start: 2023-07-11 — End: 2023-09-20

## 2023-07-11 MED ORDER — SIMVASTATIN 40 MG PO TABS
40.0000 mg | ORAL_TABLET | Freq: Every day | ORAL | 3 refills | Status: DC
Start: 2023-07-11 — End: 2024-07-29

## 2023-07-11 NOTE — Patient Instructions (Addendum)
Thank you for coming to the office today.  1 year refills on the medications BP and cholesterol  Keep up with the wound care for the pressure sore.  Waiting on PET Scan results for further chemo / treatments per Dr Cathie Hoops  Flu Shot today  Lab results look good overall  Excellent sugar, cholesterol, and other results.  Use RICE therapy: - R - Rest / relative rest with activity modification avoid overuse of joint - I - Ice packs (make sure you use a towel or sock / something to protect skin) - C - Compression with stockings / socks and reduce swelling allowing more support - E - Elevation - if significant swelling, lift leg above heart level (toes above your nose) to help reduce swelling, most helpful at night after day of being on your feet    Please schedule a Follow-up Appointment to: Return in about 6 months (around 01/08/2024) for 6 month follow-up.  If you have any other questions or concerns, please feel free to call the office or send a message through MyChart. You may also schedule an earlier appointment if necessary.  Additionally, you may be receiving a survey about your experience at our office within a few days to 1 week by e-mail or mail. We value your feedback.  Saralyn Pilar, DO [facility], [place]

## 2023-07-11 NOTE — Progress Notes (Signed)
Subjective:    Patient ID: Jacqueline Yoder, female    DOB: Jan 24, 1946, 77 y.o.   MRN: [id number]  Jacqueline Yoder is a 77 y.o. female presenting on 07/11/2023 for Annual Exam and Edema (Right leg)   HPI  Here for Annual Physical  Oncologist - Dr [name] [facility]) Radiation Oncologist - Dr Carmina Miller [facility])  Venous Insufficiency Lower Extremity Left leg mild swelling Not having redness, heat, pain Previous similar issue before   Metastatic Adenocarcinoma Carcinoma of Colon Spread to Endometrium/Uterine Cancer Initial diagnosis followed since 2019 with postmenopausal vaginal bleeding and spotting, and ultimately led to tissue diagnosis from endometrial biopsy.   S/p12/2/20 APR/posterior vaginectomy / Hysterectomy (total abdominal) /BSO/VY-flap, s/p chemotherapy and radiation therapy   Continues with chemo/immunotherapy infusions with good results. Continues with treatments per Dr Cathie Hoops She had a PET Scan on 07/09/23, pending results per Dr Rushie Chestnut  Side effect diarrhea after chemo / treatment. She has used OTC Imodium and some rx Diarrhea medication.    Improved symptoms Continues treatment   Chemotherapy induced Diarrhea side effect   Chemotherapy induced neutropenia GCSF prophylaxis   Hypomagnesemia S/p IV mag yesterday     CHRONIC HTN: Hypokalemia BP controlled Last K 3.5 Current Meds - Triamterene-HCTZ 37.5-25mg  daily. She is on Potassium Supplement daily Reports good compliance, took meds today. Tolerating well, w/o complaints. Denies CP, dyspnea, HA, edema, dizziness / lightheadedness   Chronic DVT / on Chronic Anticoagulation On Eliquis 2.5mg  daily for anticoagulation     Left Hip Pain, Osteoarthritis Chronic problem with multiple joints with osteoarthritis in knees etc, and recently she admits flare up Left hip with some pain bothering her. She had X-ray done by Oncology Dr Cathie Hoops and it also showed some osteoarthritic changes. She has treated with  Tylenol and using cane to assist.   Candidal Intertrigo Reports skin folds has used topical treatment with improvement Taking Nystatin powder previously Ketoconazole cream PRN  Gluteal / Sacral Pressure Sore Stage 1 She has Home Health wound care managing pressure sore. No worsening or new concerns.   Eczema, secondary to chemotherapy Using topical steroid therapy and topical moisturizer     Health Maintenance:  Flu shot today   Skipping Shingrix vaccine at this time.       07/06/2023   10:35 AM 06/11/2023    2:47 PM 01/03/2023    9:25 AM  Depression screen PHQ 2/9  Decreased Interest 0 0 0  Down, Depressed, Hopeless 0 0 0  PHQ - 2 Score 0 0 0  Altered sleeping 0 0 0  Tired, decreased energy 0 0 0  Change in appetite 0 0 0  Feeling bad or failure about yourself  0 0 0  Trouble concentrating 0 0 0  Moving slowly or fidgety/restless 0 0 0  Suicidal thoughts 0 0 0  PHQ-9 Score 0 0 0  Difficult doing work/chores Not difficult at all Not difficult at all Not difficult at all    Past Medical History:  Diagnosis Date  . Allergy   . Arthritis   . Blood clot in vein   . Family history of colon cancer   . GERD (gastroesophageal reflux disease)   . Hypercholesteremia   . Hypertension   . Hypertension   . Lower extremity edema   . Personal history of chemotherapy   . Rectal cancer (HCC) 12/2018  . Urinary incontinence    Past Surgical History:  Procedure Laterality Date  . ABDOMINAL HYSTERECTOMY    .  CHOLECYSTECTOMY  1971  . COLONOSCOPY WITH PROPOFOL N/A 12/03/2018   Procedure: COLONOSCOPY WITH PROPOFOL;  Surgeon: Midge Minium, MD;  Location[facility] END[facility]COPY;  Service: Endoscopy;  Laterality: N/A;  . COLONOSCOPY WITH PROPOFOL N/A 07/15/2020   Procedure: COLONOSCOPY WITH PROPOFOL;  Surgeon: Wyline Mood, MD;  Location: [facility] ENDOSCOPY;  Service: Gastroenterology;  Laterality: N/A;  . FLEXIBLE SIGMOIDOSCOPY N/A 12/06/2018   Procedure: FLEXIBLE SIGMOIDOSCOPY;  Surgeon: Wyline Mood, MD;  Location: [facility] ENDOSCOPY;  Service: Endoscopy;  Laterality: N/A;  . LAPAROSCOPIC COLOSTOMY  01/06/2019  . PORTACATH PLACEMENT N/A 04/03/2019   Procedure: INSERTION PORT-A-CATH;  Surgeon: Leafy Ro, MD;  Location: [facility] ORS;  Service: General;  Laterality: N/A;   Social History   Socioeconomic History  . Marital status: Widowed    Spouse name: Not on file  . Number of children: Not on file  . Years of education: Not on file  . Highest education level: Some college, no degree  Occupational History  . Not on file  Tobacco Use  . Smoking status: Former    Current packs/day: 0.00    Types: Cigarettes    Quit date: 12/02/1977    Years since quitting: 45.6  . Smokeless tobacco: Former  Advertising account planner  . Vaping status: Never Used  Substance and Sexual Activity  . Alcohol use: Never  . Drug use: Never  . Sexual activity: Not Currently    Birth control/protection: None  Other Topics Concern  . Not on file  Social History Narrative   Lives with daughter   Social Determinants of Health   Financial Resource Strain: Low Risk  (07/09/2023)   Overall Financial Resource Strain (CARDIA)   . Difficulty of Paying Living Expenses: Not very hard  Food Insecurity: No Food Insecurity (07/09/2023)   Hunger Vital Sign   . Worried About Programme researcher, broadcasting/film/video in the Last Year: Never true   . Ran Out of Food in the Last Year: Never true  Transportation Needs: No Transportation Needs (07/09/2023)   PRAPARE - Transportation   . Lack of Transportation (Medical): No   . Lack of Transportation (Non-Medical): No  Physical Activity: Insufficiently Active (07/09/2023)   Exercise Vital Sign   . Days of Exercise per Week: 2 days   . Minutes of Exercise per Session: 10 min  Stress: No Stress Concern Present (07/09/2023)   [employer] of Occupational Health - Occupational Stress Questionnaire   . Feeling of Stress : Not at all  Social Connections: Moderately Integrated (07/09/2023)   Social  Connection and Isolation Panel [NHANES]   . Frequency of Communication with Friends and Family: More than three times a week   . Frequency of Social Gatherings with Friends and Family: More than three times a week   . Attends Religious Services: More than 4 times per year   . Active Member of Clubs or Organizations: Yes   . Attends Banker Meetings: More than 4 times per year   . Marital Status: Widowed  Recent Concern: Social Connections - Moderately Isolated (07/06/2023)   Social Connection and Isolation Panel [NHANES]   . Frequency of Communication with Friends and Family: More than three times a week   . Frequency of Social Gatherings with Friends and Family: More than three times a week   . Attends Religious Services: More than 4 times per year   . Active Member of Clubs or Organizations: No   . Attends Banker Meetings: Never   . Marital Status:  Widowed  Intimate Partner Violence: Not At Risk (07/06/2023)   Humiliation, Afraid, Rape, and Kick questionnaire   . Fear of Current or Ex-Partner: No   . Emotionally Abused: No   . Physically Abused: No   . Sexually Abused: No   Family History  Problem Relation Age of Onset  . Colon cancer Brother 80       exposure to chemicals [place]  . Hypertension Mother   . Stroke Mother   . Kidney failure Father   . Breast cancer Neg Hx   . Ovarian cancer Neg Hx    Current Outpatient Medications on File Prior to Visit  Medication Sig  . Cholecalciferol (VITAMIN D3) 2000 units capsule Take 2,000 Units by mouth daily.  . clindamycin (CLINDAGEL) 1 % gel Apply topically 2 (two) times daily.  . diclofenac sodium (VOLTAREN) 1 % GEL Apply 2 g topically 4 (four) times daily as needed (joint pain).  Marland Kitchen diphenoxylate-atropine (LOMOTIL) 2.5-0.025 MG tablet Take 1 tablet by mouth 4 (four) times daily as needed for diarrhea or loose stools.  Marland Kitchen ELIQUIS 2.5 MG TABS tablet TAKE 1 TABLET BY MOUTH TWICE  DAILY  . fluticasone (FLONASE)  50 MCG/ACT nasal spray USE 1 SPRAY IN EACH NOSTRIL ONCE DAILY  . ipratropium (ATROVENT) 0.03 % nasal spray Place 1 spray into both nostrils 2 (two) times daily.  Marland Kitchen ketoconazole (NIZORAL) 2 % cream Apply 1 Application topically daily. For up to 2 weeks. May repeat if need.  . lidocaine-prilocaine (EMLA) cream Apply 1 application. topically as needed.  . loperamide (IMODIUM) 2 MG capsule Take 1 capsule (2 mg total) by mouth See admin instructions. With onset of loose stool, take 4mg  followed by 2mg  every 2 hours,  Maximum: 16 mg/day  . loratadine (CLARITIN) 10 MG tablet Take 10 mg by mouth daily.  . magnesium chloride (SLOW-MAG) 64 MG TBEC SR tablet Take 1 tablet (64 mg total) by mouth 2 (two) times daily.  . Multiple Vitamins-Minerals (ONE-A-DAY WOMENS 50 PLUS PO) Take 1 tablet by mouth daily.   Marland Kitchen nystatin (MYCOSTATIN/NYSTOP) powder APPLY 1 POWDER TOPICALLY THREE TIMES DAILY  . potassium chloride SA (KLOR-CON M) 20 MEQ tablet TAKE 1 TABLET BY MOUTH DAILY  . triamcinolone cream (KENALOG) 0.5 % Apply 1 Application topically 2 (two) times daily. To affected areas, for up to 2 weeks.  Marland Kitchen zinc gluconate 50 MG tablet Take 50 mg by mouth daily.  . [DISCONTINUED] prochlorperazine (COMPAZINE) 10 MG tablet Take 1 tablet (10 mg total) by mouth every 6 (six) hours as needed (NAUSEA).   Current Facility-Administered Medications on File Prior to Visit  Medication  . heparin lock flush 100 UNIT/ML injection    Review of Systems Per HPI unless specifically indicated above      Objective:    BP 120/68   Pulse 68   Ht 5\' 4"  (1.626 m)   Wt 160 lb (72.6 kg)   SpO2 98%   BMI 27.46 kg/m   Wt Readings from Last 3 Encounters:  07/11/23 160 lb (72.6 kg)  07/06/23 163 lb (73.9 kg)  07/03/23 161 lb 12.8 oz (73.4 kg)    Physical Exam Vitals and nursing note reviewed.  Constitutional:      General: She is not in acute distress.    Appearance: She is well-developed. She is not diaphoretic.     Comments:  Well-appearing, comfortable, cooperative  HENT:     Head: Normocephalic and atraumatic.  Eyes:     General:  Right eye: No discharge.        Left eye: No discharge.     Conjunctiva/sclera: Conjunctivae normal.  Neck:     Thyroid: No thyromegaly.     Vascular: No carotid bruit.  Cardiovascular:     Rate and Rhythm: Normal rate and regular rhythm.     Heart sounds: Normal heart sounds. No murmur heard. Pulmonary:     Effort: Pulmonary effort is normal. No respiratory distress.     Breath sounds: No wheezing or rales.  Abdominal:     Comments: Colostomy bag in place  Musculoskeletal:        General: Normal range of motion.     Cervical back: Normal range of motion and neck supple.     Right lower leg: No edema.     Left lower leg: No edema.  Lymphadenopathy:     Cervical: No cervical adenopathy.  Skin:    General: Skin is warm and dry.     Findings: No erythema or rash.  Neurological:     Mental Status: She is alert and oriented to person, place, and time.  Psychiatric:        Behavior: Behavior normal.     Comments: Well groomed, good eye contact, normal speech and thoughts   Results for orders placed or performed during the hospital encounter of 07/09/23  Glucose, capillary  Result Value Ref Range   Glucose-Capillary 64 (L) 70 - 99 mg/dL   *Note: Due to a large number of results and/or encounters for the requested time period, some results have not been displayed. A complete set of results can be found in Results Review.      Assessment & Plan:   Problem List Items Addressed This Visit     Chemotherapy induced neutropenia (HCC) (Chronic)   Hypokalemia (Chronic)   Benign hypertension with CKD (chronic kidney disease), stage II   Relevant Medications   triamterene-hydrochlorothiazide (DYAZIDE) 37.5-25 MG capsule   simvastatin (ZOCOR) 40 MG tablet   Chronic deep vein thrombosis (DVT) (HCC)   Relevant Medications   triamterene-hydrochlorothiazide (DYAZIDE)  37.5-25 MG capsule   simvastatin (ZOCOR) 40 MG tablet   Colostomy in place (HCC)   Hyperlipidemia   Relevant Medications   triamterene-hydrochlorothiazide (DYAZIDE) 37.5-25 MG capsule   simvastatin (ZOCOR) 40 MG tablet   Metastatic adenocarcinoma (HCC)   Other Visit Diagnoses     Annual physical exam    -  Primary   Needs flu shot       Relevant Orders   Flu Vaccine Trivalent High Dose (Fluad) (Completed)       Updated Health Maintenance information Reviewed recent lab results with patient Encouraged improvement to lifestyle with diet and exercise Goal maintain weight   Assessment and Plan    Colorectal Cancer Treatment Followed by [facility] Oncology Awaiting PET scan results to determine next steps in chemotherapy and radiation treatment. -Continue current treatment plan as directed by Dr. Cathie Hoops and Dr. Rushie Chestnut. -On Lomotil for AS NEEDED chemo induced diarrhea side effect  Lower Extremity Edema Right leg swelling without signs of infection or arterial insufficiency. Likely due to venous insufficiency and gravity. -Continue compression socks as tolerated. -Elevate legs when possible.  Pressure Ulcer Stg 1 Sacral pressure ulcer. Currently being managed by home health nursing. -Continue with current wound care plan as directed by home health nursing.  Hyperlipidemia Cholesterol levels well-controlled. -Continue current medication regimen.  Hypertension Blood pressure well-controlled. -Continue current medication regimen.  General Health Maintenance -Flu shot administered today. -  Continue with current medication regimen. -Schedule follow-up appointment in six months.        Meds ordered this encounter  Medications  . triamterene-hydrochlorothiazide (DYAZIDE) 37.5-25 MG capsule    Sig: Take 1 each (1 capsule total) by mouth daily.    Dispense:  100 capsule    Refill:  3    Please send a replace/new response with 100-Day Supply if appropriate to maximize member  benefit. Requesting 1 year supply.  . simvastatin (ZOCOR) 40 MG tablet    Sig: Take 1 tablet (40 mg total) by mouth at bedtime.    Dispense:  100 tablet    Refill:  3    Please send a replace/new response with 100-Day Supply if appropriate to maximize member benefit. Requesting 1 year supply.      Follow up plan: Return in about 6 months (around 01/08/2024) for 6 month follow-up.  Saralyn Pilar, DO [facility] 07/11/2023, 9:31 AM

## 2023-07-12 ENCOUNTER — Encounter: Payer: Self-pay | Admitting: Radiation Oncology

## 2023-07-12 ENCOUNTER — Other Ambulatory Visit: Payer: Self-pay | Admitting: Oncology

## 2023-07-12 ENCOUNTER — Ambulatory Visit
Admission: RE | Admit: 2023-07-12 | Discharge: 2023-07-12 | Disposition: A | Discharge status: Home / Self Care | Payer: 59 | Source: Ambulatory Visit | Attending: Radiation Oncology | Admitting: Radiation Oncology

## 2023-07-12 ENCOUNTER — Encounter: Payer: Self-pay | Admitting: Oncology

## 2023-07-12 VITALS — BP 134/64 | HR 80 | Temp 97.5°F | Resp 16 | Ht 64.0 in | Wt 160.0 lb

## 2023-07-12 DIAGNOSIS — C799 Secondary malignant neoplasm of unspecified site: Secondary | ICD-10-CM

## 2023-07-12 DIAGNOSIS — C20 Malignant neoplasm of rectum: Secondary | ICD-10-CM

## 2023-07-12 DIAGNOSIS — Z923 Personal history of irradiation: Secondary | ICD-10-CM | POA: Insufficient documentation

## 2023-07-12 DIAGNOSIS — C7800 Secondary malignant neoplasm of unspecified lung: Secondary | ICD-10-CM | POA: Insufficient documentation

## 2023-07-12 NOTE — Progress Notes (Signed)
Radiation Oncology Follow up Note  Name: Jacqueline Yoder   Date:   07/12/2023 MRN:  [id number] DOB: 1946/07/02    This 77 y.o. female presents to the clinic today for follow-up of the PET CT scan and patient previously treated back in 2021 for stage IIIc adenocarcinoma the rectum.  REFERRING PROVIDER: Saralyn Yoder *  HPI: Patient is seen in follow-up after PET CT scan to determine source of bleeding in a patient treated back in 2021 with neoadjuvant chemoradiation therapy for stage IIIc (T4b N1a M0 adenocarcinoma rectum.  She had undergone APR with posterior vaginectomy with lesion close to the vaginal margin.  In 2022 she developed lung metastasis.  She is currently under treatment with medical oncology.  I ran a PET CT scan.  Showing hypermetabolic activity in abdominal pelvic lymph nodes similar compared to September 2024.  She also has bilateral pulmonary nodules consistent with pulmonary metastasis.  No evidence of hypermetabolic activity in the pelvis that would account for her intermittent vaginal bleeding.  She is in no pain at this time.  COMPLICATIONS OF TREATMENT: none  FOLLOW UP COMPLIANCE: keeps appointments   PHYSICAL EXAM:  BP 134/64   Pulse 80   Temp (!) 97.5 F (36.4 C)   Resp 16   Ht 5\' 4"  (1.626 m)   Wt 160 lb (72.6 kg)   BMI 27.46 kg/m  Well-developed well-nourished patient in NAD. HEENT reveals PERLA, EOMI, discs not visualized.  Oral cavity is clear. No oral mucosal lesions are identified. Neck is clear without evidence of cervical or supraclavicular adenopathy. Lungs are clear to A&P. Cardiac examination is essentially unremarkable with regular rate and rhythm without murmur rub or thrill. Abdomen is benign with no organomegaly or masses noted. Motor sensory and DTR levels are equal and symmetric in the upper and lower extremities. Cranial nerves II through XII are grossly intact. Proprioception is intact. No peripheral adenopathy or edema is identified. No  motor or sensory levels are noted. Crude visual fields are within normal range.  RADIOLOGY RESULTS: PET CT scan reviewed compatible with above-stated findings  PLAN: At the present time I do not see any areas to target with radiation the pelvis which would affect her intermittent vaginal bleeding.  At this time she is not spotting.  I am making her a follow-up appointment with Dr. Johnnette Yoder in GYN oncology who has seen her before for his possible opinion.  I have otherwise asked to see her back in 6 months for follow-up.  Happy to reevaluate patient for palliative treatment at any time.  I would like to take this opportunity to thank you for allowing me to participate in the care of your patient.Jacqueline Miller, MD

## 2023-07-12 NOTE — Progress Notes (Signed)
DISCONTINUE ON PATHWAY REGIMEN - Colorectal     A cycle is every 14 days:     Panitumumab      Irinotecan   **Always confirm dose/schedule in your pharmacy ordering system**  REASON: Disease Progression PRIOR TREATMENT: [id number]: Irinotecan + Panitumumab q14 Days TREATMENT RESPONSE: Progressive Disease (PD)  START OFF PATHWAY REGIMEN - Colorectal   [id number] (Leucovorin IV D1 + Fluorouracil IV D1/CIV D1,2 + Oxaliplatin IV D1) q14 Days:   A cycle is every 14 days:     Oxaliplatin      Leucovorin      Fluorouracil      Fluorouracil   **Always confirm dose/schedule in your pharmacy ordering system**  Patient Characteristics: Distant Metastases, Nonsurgical Candidate, KRAS/NRAS Wild-Type (BRAF V600 Wild-Type/Unknown), Standard Cytotoxic Therapy, Fourth Line and Beyond Standard Cytotoxic Therapy Tumor Location: Rectal Therapeutic Status: Distant Metastases Microsatellite/Mismatch Repair Status: MSS/pMMR BRAF Mutation Status: Wild-Type (no mutation) KRAS/NRAS Mutation Status: Wild-Type (no mutation) Preferred Therapy Approach: Standard Cytotoxic Therapy Standard Cytotoxic Line of Therapy: Fourth Line and Beyond Standard Cytotoxic Therapy Intent of Therapy: Non-Curative / Palliative Intent, Discussed with Patient

## 2023-07-12 NOTE — Progress Notes (Signed)
Pharmacist Chemotherapy Monitoring - Initial Assessment    Anticipated start date: 07/23/23   The following has been reviewed per standard work regarding the patient's treatment regimen: The patient's diagnosis, treatment plan and drug doses, and organ/hematologic function Lab orders and baseline tests specific to treatment regimen  The treatment plan start date, drug sequencing, and pre-medications Prior authorization status  Patient's documented medication list, including drug-drug interaction screen and prescriptions for anti-emetics and supportive care specific to the treatment regimen The drug concentrations, fluid compatibility, administration routes, and timing of the medications to be used The patient's access for treatment and lifetime cumulative dose history, if applicable  The patient's medication allergies and previous infusion related reactions, if applicable   Changes made to treatment plan:  N/A  Follow up needed:  prescriptions needed for anti-emetics PA not initiated.   Ebony Hail, Pharm.D., CPP 07/12/2023@4 :46 PM

## 2023-07-16 ENCOUNTER — Other Ambulatory Visit: Payer: Self-pay | Admitting: Oncology

## 2023-07-16 MED ORDER — DIPHENOXYLATE-ATROPINE 2.5-0.025 MG PO TABS: 1.0000 | ORAL_TABLET | Freq: Four times a day (QID) | ORAL | 0 refills | Status: DC | PRN

## 2023-07-17 DIAGNOSIS — Z556 Problems related to health literacy: Secondary | ICD-10-CM | POA: Diagnosis not present

## 2023-07-17 DIAGNOSIS — B372 Candidiasis of skin and nail: Secondary | ICD-10-CM | POA: Diagnosis not present

## 2023-07-17 DIAGNOSIS — Z79899 Other long term (current) drug therapy: Secondary | ICD-10-CM | POA: Diagnosis not present

## 2023-07-17 DIAGNOSIS — Z87891 Personal history of nicotine dependence: Secondary | ICD-10-CM | POA: Diagnosis not present

## 2023-07-17 DIAGNOSIS — I10 Essential (primary) hypertension: Secondary | ICD-10-CM | POA: Diagnosis not present

## 2023-07-17 DIAGNOSIS — L89152 Pressure ulcer of sacral region, stage 2: Secondary | ICD-10-CM | POA: Diagnosis not present

## 2023-07-17 DIAGNOSIS — Z7901 Long term (current) use of anticoagulants: Secondary | ICD-10-CM | POA: Diagnosis not present

## 2023-07-18 ENCOUNTER — Other Ambulatory Visit: Payer: Self-pay | Admitting: Family Medicine

## 2023-07-18 ENCOUNTER — Other Ambulatory Visit: Payer: Self-pay | Admitting: Oncology

## 2023-07-18 DIAGNOSIS — Z7901 Long term (current) use of anticoagulants: Secondary | ICD-10-CM

## 2023-07-18 DIAGNOSIS — I825Z3 Chronic embolism and thrombosis of unspecified deep veins of distal lower extremity, bilateral: Secondary | ICD-10-CM

## 2023-07-19 ENCOUNTER — Encounter: Payer: Self-pay | Admitting: Oncology

## 2023-07-19 NOTE — Telephone Encounter (Signed)
Requested medications are due for refill today.  unsure  Requested medications are on the active medications list.  yes  Last refill. 01/17/2023   Future visit scheduled.   yes  Notes to clinic.  Medication is historical.    Requested Prescriptions  Pending Prescriptions Disp Refills   ipratropium (ATROVENT) 0.03 % nasal spray [Pharmacy Med Name: Ipratropium Bromide 0.03 % Nasal Solution] 60 mL 2    Sig: USE 1 SPRAY IN BOTH NOSTRILS  TWICE DAILY     Off-Protocol Failed - 07/18/2023 10:05 PM      Failed - Medication not assigned to a protocol, review manually.      Passed - Valid encounter within last 12 months    Recent Outpatient Visits           1 week ago Annual physical exam   [place] [facility] [place], Netta Neat, DO   1 month ago Pressure injury of sacral region, stage 1   [place] [facility] [place], Netta Neat, DO   6 months ago Metastatic adenocarcinoma [facility])   [place] [facility] Smitty Cords, DO   11 months ago Viral URI with cough   [place] [facility] [place], Salvadore Oxford, NP   1 year ago Metastatic adenocarcinoma [facility])   [place] [facility] Smitty Cords, DO       Future Appointments             In 5 months Althea Charon, Netta Neat, DO [place] [facility], [facility]           Off-Protocol Failed - 07/18/2023 10:05 PM      Failed - Medication not assigned to a protocol, review manually.      Passed - Valid encounter within last 12 months    Recent Outpatient Visits           1 week ago Annual physical exam   [place] [facility] [place], Netta Neat, DO   1 month ago Pressure injury of sacral region, stage 1   [place] [facility] [place], Netta Neat, DO   6 months ago [facility]astatic [facility])   [place] [facility] Smitty Cords, DO   11 months ago Viral URI with cough    [facility] [place], Salvadore Oxford, NP   1 year ago Metastatic adenocarcinoma [facility])    [facility] Althea Charon, Netta Neat, DO       Future Appointments             In 5 months Althea Charon, Netta Neat, DO  [facility] [place], [facility]

## 2023-07-20 ENCOUNTER — Other Ambulatory Visit: Payer: Self-pay | Admitting: Oncology

## 2023-07-20 MED FILL — Dexamethasone Sodium Phosphate Inj 100 MG/10ML: INTRAMUSCULAR | Qty: 1 | Status: AC

## 2023-07-23 ENCOUNTER — Inpatient Hospital Stay: Payer: 59

## 2023-07-23 ENCOUNTER — Inpatient Hospital Stay (HOSPITAL_BASED_OUTPATIENT_CLINIC_OR_DEPARTMENT_OTHER): Payer: 59 | Admitting: Oncology

## 2023-07-23 ENCOUNTER — Encounter: Payer: Self-pay | Admitting: Oncology

## 2023-07-23 ENCOUNTER — Inpatient Hospital Stay: Payer: 59 | Attending: Oncology

## 2023-07-23 VITALS — BP 127/62 | HR 83 | Temp 96.5°F | Resp 18 | Wt 162.5 lb

## 2023-07-23 VITALS — BP 124/58 | HR 63 | Temp 97.0°F | Resp 18

## 2023-07-23 DIAGNOSIS — N950 Postmenopausal bleeding: Secondary | ICD-10-CM | POA: Diagnosis not present

## 2023-07-23 DIAGNOSIS — K219 Gastro-esophageal reflux disease without esophagitis: Secondary | ICD-10-CM | POA: Diagnosis not present

## 2023-07-23 DIAGNOSIS — Z86711 Personal history of pulmonary embolism: Secondary | ICD-10-CM | POA: Diagnosis not present

## 2023-07-23 DIAGNOSIS — C7951 Secondary malignant neoplasm of bone: Secondary | ICD-10-CM | POA: Diagnosis not present

## 2023-07-23 DIAGNOSIS — I129 Hypertensive chronic kidney disease with stage 1 through stage 4 chronic kidney disease, or unspecified chronic kidney disease: Secondary | ICD-10-CM | POA: Insufficient documentation

## 2023-07-23 DIAGNOSIS — C799 Secondary malignant neoplasm of unspecified site: Secondary | ICD-10-CM

## 2023-07-23 DIAGNOSIS — E7800 Pure hypercholesterolemia, unspecified: Secondary | ICD-10-CM | POA: Insufficient documentation

## 2023-07-23 DIAGNOSIS — K521 Toxic gastroenteritis and colitis: Secondary | ICD-10-CM

## 2023-07-23 DIAGNOSIS — N393 Stress incontinence (female) (male): Secondary | ICD-10-CM | POA: Diagnosis not present

## 2023-07-23 DIAGNOSIS — D398 Neoplasm of uncertain behavior of other specified female genital organs: Secondary | ICD-10-CM | POA: Insufficient documentation

## 2023-07-23 DIAGNOSIS — I700 Atherosclerosis of aorta: Secondary | ICD-10-CM | POA: Insufficient documentation

## 2023-07-23 DIAGNOSIS — C20 Malignant neoplasm of rectum: Secondary | ICD-10-CM | POA: Diagnosis not present

## 2023-07-23 DIAGNOSIS — I2510 Atherosclerotic heart disease of native coronary artery without angina pectoris: Secondary | ICD-10-CM | POA: Diagnosis not present

## 2023-07-23 DIAGNOSIS — D6481 Anemia due to antineoplastic chemotherapy: Secondary | ICD-10-CM

## 2023-07-23 DIAGNOSIS — Z7901 Long term (current) use of anticoagulants: Secondary | ICD-10-CM | POA: Insufficient documentation

## 2023-07-23 DIAGNOSIS — Z5111 Encounter for antineoplastic chemotherapy: Secondary | ICD-10-CM

## 2023-07-23 DIAGNOSIS — L600 Ingrowing nail: Secondary | ICD-10-CM | POA: Insufficient documentation

## 2023-07-23 DIAGNOSIS — E876 Hypokalemia: Secondary | ICD-10-CM

## 2023-07-23 DIAGNOSIS — Z87891 Personal history of nicotine dependence: Secondary | ICD-10-CM | POA: Diagnosis not present

## 2023-07-23 DIAGNOSIS — R600 Localized edema: Secondary | ICD-10-CM | POA: Insufficient documentation

## 2023-07-23 DIAGNOSIS — R590 Localized enlarged lymph nodes: Secondary | ICD-10-CM | POA: Diagnosis not present

## 2023-07-23 DIAGNOSIS — N182 Chronic kidney disease, stage 2 (mild): Secondary | ICD-10-CM | POA: Insufficient documentation

## 2023-07-23 DIAGNOSIS — Z79899 Other long term (current) drug therapy: Secondary | ICD-10-CM | POA: Diagnosis not present

## 2023-07-23 DIAGNOSIS — I82501 Chronic embolism and thrombosis of unspecified deep veins of right lower extremity: Secondary | ICD-10-CM | POA: Diagnosis not present

## 2023-07-23 DIAGNOSIS — T451X5A Adverse effect of antineoplastic and immunosuppressive drugs, initial encounter: Secondary | ICD-10-CM

## 2023-07-23 LAB — CBC WITH DIFFERENTIAL (CANCER CENTER ONLY)
Abs Immature Granulocytes: 0.01 10*3/uL (ref 0.00–0.07)
Basophils Absolute: 0 10*3/uL (ref 0.0–0.1)
Basophils Relative: 1 %
Eosinophils Absolute: 0.2 10*3/uL (ref 0.0–0.5)
Eosinophils Relative: 3 %
HCT: 34.9 % — ABNORMAL LOW (ref 36.0–46.0)
Hemoglobin: 11 g/dL — ABNORMAL LOW (ref 12.0–15.0)
Immature Granulocytes: 0 %
Lymphocytes Relative: 24 %
Lymphs Abs: 1.2 10*3/uL (ref 0.7–4.0)
MCH: 30 pg (ref 26.0–34.0)
MCHC: 31.5 g/dL (ref 30.0–36.0)
MCV: 95.1 fL (ref 80.0–100.0)
Monocytes Absolute: 0.6 10*3/uL (ref 0.1–1.0)
Monocytes Relative: 12 %
Neutro Abs: 3 10*3/uL (ref 1.7–7.7)
Neutrophils Relative %: 60 %
Platelet Count: 216 10*3/uL (ref 150–400)
RBC: 3.67 MIL/uL — ABNORMAL LOW (ref 3.87–5.11)
RDW: 15.8 % — ABNORMAL HIGH (ref 11.5–15.5)
WBC Count: 5 10*3/uL (ref 4.0–10.5)
nRBC: 0 % (ref 0.0–0.2)

## 2023-07-23 LAB — CMP (CANCER CENTER ONLY)
ALT: 27 U/L (ref 0–44)
AST: 38 U/L (ref 15–41)
Albumin: 3.7 g/dL (ref 3.5–5.0)
Alkaline Phosphatase: 71 U/L (ref 38–126)
Anion gap: 7 (ref 5–15)
BUN: 15 mg/dL (ref 8–23)
CO2: 28 mmol/L (ref 22–32)
Calcium: 8.7 mg/dL — ABNORMAL LOW (ref 8.9–10.3)
Chloride: 102 mmol/L (ref 98–111)
Creatinine: 0.76 mg/dL (ref 0.44–1.00)
GFR, Estimated: >60 mL/min (ref >60)
Glucose, Bld: 104 mg/dL — ABNORMAL HIGH (ref 70–99)
Potassium: 3.6 mmol/L (ref 3.5–5.1)
Sodium: 137 mmol/L (ref 135–145)
Total Bilirubin: 0.5 mg/dL (ref 0.3–1.2)
Total Protein: 6.5 g/dL (ref 6.5–8.1)

## 2023-07-23 MED ORDER — SODIUM CHLORIDE 0.9 % IV SOLN
10.0000 mg | Freq: Once | INTRAVENOUS | Status: AC
  Administered 2023-07-23: 10 mg via INTRAVENOUS
  Filled 2023-07-23: qty 10

## 2023-07-23 MED ORDER — OXALIPLATIN CHEMO INJECTION 100 MG/20ML
70.0000 mg/m2 | Freq: Once | INTRAVENOUS | Status: AC
  Administered 2023-07-23: 125 mg via INTRAVENOUS
  Filled 2023-07-23: qty 25

## 2023-07-23 MED ORDER — PALONOSETRON HCL INJECTION 0.25 MG/5ML
0.2500 mg | Freq: Once | INTRAVENOUS | Status: AC
  Administered 2023-07-23: 0.25 mg via INTRAVENOUS
  Filled 2023-07-23: qty 5

## 2023-07-23 MED ORDER — DIPHENHYDRAMINE HCL 25 MG PO CAPS
25.0000 mg | ORAL_CAPSULE | Freq: Once | ORAL | Status: AC
  Administered 2023-07-23: 25 mg via ORAL
  Filled 2023-07-23: qty 1

## 2023-07-23 MED ORDER — SODIUM CHLORIDE 0.9 % IV SOLN
2400.0000 mg/m2 | INTRAVENOUS | Status: DC
  Administered 2023-07-23: 4350 mg via INTRAVENOUS
  Filled 2023-07-23: qty 87

## 2023-07-23 MED ORDER — DEXTROSE 5 % IV SOLN
Freq: Once | INTRAVENOUS | Status: AC
  Filled 2023-07-23: qty 250

## 2023-07-23 MED ORDER — ACETAMINOPHEN 325 MG PO TABS
650.0000 mg | ORAL_TABLET | Freq: Once | ORAL | Status: AC
  Administered 2023-07-23: 650 mg via ORAL
  Filled 2023-07-23: qty 2

## 2023-07-23 MED ORDER — LEUCOVORIN CALCIUM INJECTION 350 MG
400.0000 mg/m2 | Freq: Once | INTRAVENOUS | Status: AC
  Administered 2023-07-23: 724 mg via INTRAVENOUS
  Filled 2023-07-23: qty 36.2

## 2023-07-23 NOTE — Assessment & Plan Note (Signed)
Hb stable, observation

## 2023-07-23 NOTE — Progress Notes (Signed)
Hematology/Oncology Progress note [phone]) [phone] [phone]) [phone]      CHIEF COMPLAINTS/REASON FOR VISIT:  Follow up for rectal cancer  ASSESSMENT & PLAN:   Cancer Staging  Rectal cancer [facility]) Staging form: Colon and Rectum, AJCC 8th Edition - Pathologic stage from 10/06/2019: Stage IIC (ypT4b, pN0, cM0) - Signed by [name], MD on 10/06/2019 - Clinical stage from 06/30/2020: Jacqueline Yoder - Signed by [name], MD on 04/17/2022 - Pathologic: Stage Unknown (rpTX, pNX, cM1) - Signed by [name], MD on 01/31/2021   Rectal cancer [facility]) History of stage IIIC Rectal cancer, s/p TNT, followed by 09/17/19 APR/posterior vaginectomy/TAH/BSO/VY-flap, pT4b pN0 with close vaginal margin 0.2 mm.  Uterus and ovaries negative for malignancy. palliative radiation to vaginal recurrence- 01/19/21 recurrence with lung metastasis.-Palliative -FOLFIRI plus bevacizumab.  Irinotecan was dropped in November 2022 due to side effects. Negative for UGT1A1*28 - radiographically stable, rise of CEA-July 2023 CT lung metastasis worse--> Dec 2023 PET showed progression in pelvic lymph nodes and bone lesions,--> 2nd line irinotecan +panitumumab-->CT March 2024 stable. --> June 2024 CT showed mild lung progression/Progressive left iliac bone metastasis, vulvar mass difficult for biopsy in office,  Tempus Liquid biopsy - no actionable variants --> 06/25/2023 CT showed progression of lung nodules [SLD increase 71mm] and iliac inguinal lymphadenopathy--> 07/23/23 switch to dose reduced FOLFOX   Labs are reviewed and discussed with patient, CEA nadir was in 490, currently gradually increasing Proceed with dose reduced FOLFOX   Encounter for antineoplastic chemotherapy Chemotherapy plan as listed above.   Anemia due to antineoplastic chemotherapy Hb stable, observation  History of pulmonary embolism History of PE and bilateral lower extremity DVT  05/18/23 Repeat right LE US showed chronic non occlusive DVT Continue   Eliquis 2.5 mg twice daily for anticoagulation prophylaxis. Recommend leg elevation and compression stocking.   Chemotherapy induced diarrhea continue lomotil  QID PRN as instructed.   Hypokalemia Continue potassium chloride 20 mEq daily.    Hypomagnesemia If magnesium is 1.0 - 1.2:  administrate 6 gm IV magnesium sulfate If magnesium is 1.2-1.4, administrate 4 gm IV magnesium sulfate If magnesium is 1.5-1.6:  administrate 2 gm IV magnesium sulfate.  Continue slow mag orally 64mg  twice daily.     Follow up 2 weeks.  All questions were answered. The patient knows to call the clinic with any problems, questions or concerns.  [name], MD, PhD [facility] Hematology Oncology 07/23/2023      HISTORY OF PRESENTING ILLNESS:  Oncology History  Rectal cancer (HCC)  01/23/2019 Initial Diagnosis   Rectal cancer (HCC)   02/04/2019 - 02/04/2019 Chemotherapy   The patient had capecitabine (XELODA) 150 MG tablet, 150 mg (100 % of original dose 150 mg), Oral, 2 times daily after meals, 1 of 1 cycle, Start date: 01/27/2019, End date: 04/03/2019 Dose modification: 150 mg (original dose 150 mg, Cycle 1) capecitabine (XELODA) 500 MG tablet, 1,500 mg (100 % of original dose 1,500 mg), Oral, 2 times daily after meals, 1 of 1 cycle, Start date: 01/27/2019, End date: 04/03/2019 Dose modification: 1,500 mg (original dose 1,500 mg, Cycle 1)  for chemotherapy treatment.    04/09/2019 - 07/18/2019 Chemotherapy   The patient had palonosetron (ALOXI) injection 0.25 mg, 0.25 mg, Intravenous,  Once, 8 of 8 cycles Administration: 0.25 mg (04/09/2019), 0.25 mg (04/23/2019), 0.25 mg (05/07/2019), 0.25 mg (05/21/2019), 0.25 mg (06/04/2019), 0.25 mg (06/18/2019), 0.25 mg (07/02/2019), 0.25 mg (07/16/2019) leucovorin 850 mg in dextrose 5 % 250 mL infusion, 844 mg, Intravenous,  Once, 8 of 8 cycles Administration: 850 mg (04/09/2019), 850 mg (04/23/2019), 850 mg (05/07/2019), 850 mg (05/21/2019), 800 mg (06/18/2019), 800 mg (07/02/2019),  800 mg (07/16/2019) oxaliplatin (ELOXATIN) 150 mg in dextrose 5 % 500 mL chemo infusion, 71 mg/m2 = 160 mg (100 % of original dose 75 mg/m2), Intravenous,  Once, 8 of 8 cycles Dose modification: 75 mg/m2 (original dose 75 mg/m2, Cycle 1, Reason: Provider Judgment) Administration: 150 mg (04/09/2019), 150 mg (04/23/2019), 150 mg (05/07/2019), 150 mg (05/21/2019), 150 mg (06/04/2019), 150 mg (06/18/2019), 150 mg (07/02/2019), 150 mg (07/16/2019) fluorouracil (ADRUCIL) 5,000 mg in sodium chloride 0.9 % 150 mL chemo infusion, 2,370 mg/m2 = 5,050 mg, Intravenous, 1 Day/Dose, 8 of 8 cycles Administration: 5,000 mg (04/09/2019), 5,000 mg (04/23/2019), 5,000 mg (05/07/2019), 5,000 mg (05/21/2019), 5,000 mg (06/04/2019), 5,000 mg (06/18/2019), 5,000 mg (07/02/2019), 5,000 mg (07/16/2019)  for chemotherapy treatment.    10/06/2019 Cancer Staging   Staging form: Colon and Rectum, AJCC 8th Edition - Pathologic stage from 10/06/2019: Stage IIC (ypT4b, pN0, cM0) - Signed by [name], MD on 10/06/2019   06/30/2020 Cancer Staging   Staging form: Colon and Rectum, AJCC 8th Edition - Clinical stage from 06/30/2020: Jacqueline Yoder - Signed by [name], MD on 04/17/2022 Stage prefix: Recurrence Total positive nodes: 1   07/16/2020 - 07/19/2020 Chemotherapy         01/31/2021 Cancer Staging   Staging form: Colon and Rectum, AJCC 8th Edition - Pathologic: Stage Unknown (rpTX, pNX, cM1) - Signed by [name], MD on 01/31/2021 Stage prefix: Recurrence   07/14/2022 Imaging   Bone scan  1. No evidence of skeletal metastatic disease in the hips. 2. Asymmetric increased activity in the RIGHT humeral head is favored degenerative as described above. If pain in the RIGHT shoulder consider further evaluation with plain film or MRI   07/17/2022 Imaging   1. Stable scattered small bilateral pulmonary nodules, largest 8 by 6 mm in the right middle lobe. 2. Subtle chronically stable posterior cortical irregularity and heterogeneity in the left  iliac bone, not appreciable on the bone scan of 07/14/2022. This has been present at least over the past year and could reflect radiation therapy related findings although strictly speaking, malignant involvement of the left iliac bone is difficult to exclude. If clinically warranted this could be further assessed with nuclear medicine PET-CT or MRI of the bony pelvis with and without contrast. 3. Pelvic floor laxity with loops of small bowel extending 3 cm below the pubococcygeal line. 4. Aortic and systemic atherosclerosis as detailed above. Aortic Atherosclerosis    12/26/2022 Imaging   CT chest abdomen pelvis w contrast 1. Stable scattered solid pulmonary nodules. 2. Right inguinal and right pelvic lymph nodes which were hypermetabolic on prior PET-CT are stable. 3. Osseous lesion of the proximal right humerus demonstrates increased sclerosis when compared with the prior PET-CT, possibly due to treatment response. 4. 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 5. Aortic Atherosclerosis     06/25/2023 Imaging   CT chest abdomen pelvis w contrast showed 1. Increased size of the solid bilateral pulmonary nodules,consistent with worsening metastatic disease. 2. Increased size of the right external iliac and inguinal lymph nodes, concerning for worsening metastatic disease. 3. No significant interval change in the left iliac bone osseous metastasis. 4. Enlarged main pulmonary artery measuring 3.6 cm, which can be seen in the setting of pulmonary arterial hypertension. 5.  Aortic Atherosclerosis (ICD10-I70.0)     Miscellaneous   Tempus liquid  biopsy showed no actionable variants.  NOTCH1 missense variant, BRCA2 missense variant , MYC missense variant   07/11/2023 Imaging   PET showed 1. Multifocal tracer avid abdominopelvic lymph nodes are identified compatible with metastatic disease. 2. Multifocal bilateral pulmonary nodules are identified compatible with pulmonary metastasis.  3.  Diffuse increased radiotracer uptake throughout the marrow space is identified diminishing sensitivity for detecting osseous metastasis. No abnormal increased radiotracer uptake identified above background increased bone marrow activity to indicate metabolically active bone metastases. 4.  Aortic Atherosclerosis   Metastatic adenocarcinoma [facility])       # Oncology Treatment:  02/03/2019- 03/19/2019  concurrent Xeloda and radiation.  Xeloda dose 825mg  /m2 BID - rounded to 1650mg  BID- on days of radiation. 04/09/2019, started on FOLFOX with bolus early.  Omitted.  07/16/2019 finished 8 cycles of FOLFOX. 09/17/19 APR/posterior vaginectomy/TAH/BSO/VY-flap pT4b pN0 with close vaginal margin 0.2 mm.  Uterus and ovaries negative for malignancy. Patient reports bilateral lower extremity numbness and tingling, intermittent, left worse than right. She has lost a lot of weight since her APR surgery.   #Family history with half brother having's history of colon cancer prostate cancer.  Personal history of colorectal cancer.  Patient has not decided if she wants genetic testing.    # history of PE( 01/13/2020)  in the bilateral lower extremity DVT (01/13/2020).   She finishes 6 months of anticoagulation with Eliquis 5 mg twice daily. Now switched to Eliquis 2.5 mg twice daily..  # She has now developed recurrent disease. #06/30/20  vaginal introitus mass biopsied. Pathology is consistent with metastatic colorectal adenocarcinoma I have discussed with [name]  Dr. Luciano Cutter and the mass is not resectable. Patient has also had colonoscopy by Dr. Tobi Bastos yesterday. Normal examination. # 07/16/2020 cycle 1 FOLFIRI  # 07/20/2020 PET scan was done for further evaluation, images are consistent with local recurrence, no distant metastasis. #Discussed with radiation oncology Dr. Rushie Chestnut will recommends concurrent chemotherapy and radiation. 08/02/2020-08/16/2020, patient starts radiation.  Xeloda was held due to  neutropenia 08/17/2020,-09/06/2020 Xeloda 1500 mg twice daily concurrently with radiation  01/31/21 started on FOLFIRI + Bev 05/18/2021 CT chest abdomen pelvis showed Previously noted enlargement of bilateral inguinal lymph nodes is resolved, consistent with treatment response of nodal metastatic disease. Interval decrease in size of multiple small bilateral pulmonary nodules, consistent with treatment response of pulmonary metastatic disease. No evidence of new metastatic disease. 05/24/2021 - 08/30/2021, continued on FOLFIRI plus bevacizumab.  Irinotecan dose was reduced, eventually 100mg /m2  09/02/2021, CT chest abdomen pelvis without contrast Showed small bilateral pulmonary nodules, unchanged.  Stable metastatic disease.  No noncontrast evidence of new metastatic disease in the chest abdomen pelvis.  Small parastomal hernia.  Enlargement of main pulmonary artery.  Coronary artery disease.  09/13/2021, maintenance 5-FU/bevacizumab 11/28/2021, 5-FU/Irinotecan/bevacizumab.  Irinotecan 100 mg/m2 was added back due to progressively increasing CEA.  -right lower extremity edema, US showed non occlusive DVT, edema has improved.   INTERVAL HISTORY Jacqueline Yoder is a 77 y.o. female who has above history reviewed by me presents for follow-up of rectal cancer. On Irinotecan and panitumumab. she tolerates well.   She was accompanied by her daughter. - chronic diarrhea is manageable. Was seen by Dr. Rushie Chestnut, no plan of palliative RT currently.  She take Lomotil PRN, usually 1-2 time per day.  + fatigue.   Review of Systems  Constitutional:  Positive for fatigue. Negative for appetite change, chills, fever and unexpected weight change.  HENT:   Negative for hearing loss  and voice change.   Eyes:  Negative for eye problems.  Respiratory:  Negative for chest tightness and cough.   Cardiovascular:  Negative for chest pain.  Gastrointestinal:  Positive for diarrhea. Negative for abdominal distention,  abdominal pain, blood in stool, constipation and nausea.  Endocrine: Negative for hot flashes.  Genitourinary:  Positive for vaginal bleeding. Negative for difficulty urinating and frequency.   Musculoskeletal:  Positive for arthralgias.  Skin:  Negative for itching.  Neurological:  Negative for extremity weakness and numbness.  Hematological:  Negative for adenopathy.  Psychiatric/Behavioral:  Negative for confusion.     MEDICAL HISTORY:  Past Medical History:  Diagnosis Date   Allergy    Arthritis    Blood clot in vein    Family history of colon cancer    GERD (gastroesophageal reflux disease)    Hypercholesteremia    Hypertension    Hypertension    Lower extremity edema    Personal history of chemotherapy    Rectal cancer (HCC) 12/2018   Urinary incontinence     SURGICAL HISTORY: Past Surgical History:  Procedure Laterality Date   ABDOMINAL HYSTERECTOMY     CHOLECYSTECTOMY  1971   COLONOSCOPY WITH PROPOFOL N/A 12/03/2018   Procedure: COLONOSCOPY WITH PROPOFOL;  Surgeon: Midge Minium, MD;  Location: [facility] ENDOSCOPY;  Service: Endoscopy;  Laterality: N/A;   COLONOSCOPY WITH PROPOFOL N/A 07/15/2020   Procedure: COLONOSCOPY WITH PROPOFOL;  Surgeon: Wyline Mood, MD;  Location: [facility] ENDOSCOPY;  Service: Gastroenterology;  Laterality: N/A;   FLEXIBLE SIGMOIDOSCOPY N/A 12/06/2018   Procedure: FLEXIBLE SIGMOIDOSCOPY;  Surgeon: Wyline Mood, MD;  Location: [facility] ENDOSCOPY;  Service: Endoscopy;  Laterality: N/A;   LAPAROSCOPIC COLOSTOMY  01/06/2019   PORTACATH PLACEMENT N/A 04/03/2019   Procedure: INSERTION PORT-A-CATH;  Surgeon: Leafy Ro, MD;  Location: [facility] ORS;  Service: General;  Laterality: N/A;    SOCIAL HISTORY: Social History   Socioeconomic History   Marital status: Widowed    Spouse name: Not on file   Number of children: Not on file   Years of education: Not on file   Highest education level: Some college, no degree  Occupational History   Not on file  Tobacco  Use   Smoking status: Former    Current packs/day: 0.00    Types: Cigarettes    Quit date: 12/02/1977    Years since quitting: 45.6   Smokeless tobacco: Former  Building services engineer status: Never Used  Substance and Sexual Activity   Alcohol use: Never   Drug use: Never   Sexual activity: Not Currently    Birth control/protection: None  Other Topics Concern   Not on file  Social History Narrative   Lives with daughter   Social Determinants of Health   Financial Resource Strain: Low Risk  (07/09/2023)   Overall Financial Resource Strain (CARDIA)    Difficulty of Paying Living Expenses: Not very hard  Food Insecurity: No Food Insecurity (07/09/2023)   Hunger Vital Sign    Worried About Running Out of Food in the Last Year: Never true    Ran Out of Food in the Last Year: Never true  Transportation Needs: No Transportation Needs (07/09/2023)   PRAPARE - Administrator, Civil Service (Medical): No    Lack of Transportation (Non-Medical): No  Physical Activity: Insufficiently Active (07/09/2023)   Exercise Vital Sign    Days of Exercise per Week: 2 days    Minutes of Exercise per Session: 10 min  Stress:  No Stress Concern Present (07/09/2023)   [employer] of Occupational Health - Occupational Stress Questionnaire    Feeling of Stress : Not at all  Social Connections: Moderately Integrated (07/09/2023)   Social Connection and Isolation Panel [NHANES]    Frequency of Communication with Friends and Family: More than three times a week    Frequency of Social Gatherings with Friends and Family: More than three times a week    Attends Religious Services: More than 4 times per year    Active Member of [employer] or Organizations: Yes    Attends Banker Meetings: More than 4 times per year    Marital Status: Widowed  Recent Concern: Social Connections - Moderately Isolated (07/06/2023)   Social Connection and Isolation Panel [NHANES]    Frequency of Communication  with Friends and Family: More than three times a week    Frequency of Social Gatherings with Friends and Family: More than three times a week    Attends Religious Services: More than 4 times per year    Active Member of [employer] or Organizations: No    Attends Banker Meetings: Never    Marital Status: Widowed  Intimate Partner Violence: Not At Risk (07/06/2023)   Humiliation, Afraid, Rape, and Kick questionnaire    Fear of Current or Ex-Partner: No    Emotionally Abused: No    Physically Abused: No    Sexually Abused: No    FAMILY HISTORY: Family History  Problem Relation Age of Onset   Colon cancer Brother 39       exposure to chemicals [place]   Hypertension Mother    Stroke Mother    Kidney failure Father    Breast cancer Neg Hx    Ovarian cancer Neg Hx     ALLERGIES:  is allergic to sulfamethoxazole-trimethoprim.  MEDICATIONS:  Current Outpatient Medications  Medication Sig Dispense Refill   Cholecalciferol (VITAMIN D3) 2000 units capsule Take 2,000 Units by mouth daily.     clindamycin (CLINDAGEL) 1 % gel Apply topically 2 (two) times daily. 30 g 5   diclofenac sodium (VOLTAREN) 1 % GEL Apply 2 g topically 4 (four) times daily as needed (joint pain).  11   diphenoxylate-atropine (LOMOTIL) 2.5-0.025 MG tablet Take 1 tablet by mouth 4 (four) times daily as needed for diarrhea or loose stools. 120 tablet 0   ELIQUIS 2.5 MG TABS tablet TAKE 1 TABLET BY MOUTH TWICE  DAILY 200 tablet 2   fluticasone (FLONASE) 50 MCG/ACT nasal spray USE 1 SPRAY IN EACH NOSTRIL ONCE DAILY 16 g 3   ipratropium (ATROVENT) 0.03 % nasal spray USE 1 SPRAY IN BOTH NOSTRILS  TWICE DAILY 60 mL 2   ketoconazole (NIZORAL) 2 % cream Apply 1 Application topically daily. For up to 2 weeks. May repeat if need. 30 g 2   lidocaine-prilocaine (EMLA) cream Apply 1 application. topically as needed. 30 g 6   loperamide (IMODIUM) 2 MG capsule Take 1 capsule (2 mg total) by mouth See admin instructions. With  onset of loose stool, take 4mg  followed by 2mg  every 2 hours,  Maximum: 16 mg/day 120 capsule 1   loratadine (CLARITIN) 10 MG tablet Take 10 mg by mouth daily.     magnesium chloride (SLOW-MAG) 64 MG TBEC SR tablet Take 1 tablet (64 mg total) by mouth 2 (two) times daily. 60 tablet 2   Multiple Vitamins-Minerals (ONE-A-DAY WOMENS 50 PLUS PO) Take 1 tablet by mouth daily.      potassium  chloride SA (KLOR-CON M) 20 MEQ tablet TAKE 1 TABLET BY MOUTH DAILY 100 tablet 2   simvastatin (ZOCOR) 40 MG tablet Take 1 tablet (40 mg total) by mouth at bedtime. 100 tablet 3   triamcinolone cream (KENALOG) 0.5 % Apply 1 Application topically 2 (two) times daily. To affected areas, for up to 2 weeks. 30 g 2   triamterene-hydrochlorothiazide (DYAZIDE) 37.5-25 MG capsule Take 1 each (1 capsule total) by mouth daily. 100 capsule 3   zinc gluconate 50 MG tablet Take 50 mg by mouth daily.     nystatin (MYCOSTATIN/NYSTOP) powder APPLY 1 POWDER TOPICALLY THREE TIMES DAILY 45 g 0   No current facility-administered medications for this visit.   Facility-Administered Medications Ordered in Other Visits  Medication Dose Route Frequency Provider Last Rate Last Admin   dexamethasone (DECADRON) 10 mg in sodium chloride 0.9 % 50 mL IVPB  10 mg Intravenous Once [name], MD       dextrose 5 % solution   Intravenous Once [name], MD       fluorouracil (ADRUCIL) 4,350 mg in sodium chloride 0.9 % 63 mL chemo infusion  2,400 mg/m2 (Treatment Plan Recorded) Intravenous 1 day or 1 dose [name], MD       heparin lock flush 100 UNIT/ML injection            leucovorin 724 mg in dextrose 5 % 250 mL infusion  400 mg/m2 (Treatment Plan Recorded) Intravenous Once [name], MD       oxaliplatin (ELOXATIN) 125 mg in dextrose 5 % 500 mL chemo infusion  70 mg/m2 (Treatment Plan Recorded) Intravenous Once [name], MD       palonosetron (ALOXI) injection 0.25 mg  0.25 mg Intravenous Once [name], MD         PHYSICAL EXAMINATION: ECOG  PERFORMANCE STATUS: 1 - Symptomatic but completely ambulatory  Physical Exam Constitutional:      General: She is not in acute distress. HENT:     Head: Normocephalic and atraumatic.  Eyes:     General: No scleral icterus. Cardiovascular:     Rate and Rhythm: Normal rate.     Heart sounds: Normal heart sounds.  Pulmonary:     Effort: Pulmonary effort is normal. No respiratory distress.     Breath sounds: No wheezing.  Abdominal:     General: Bowel sounds are normal. There is no distension.     Palpations: Abdomen is soft.     Comments: + Colostomy bag   Musculoskeletal:        General: No deformity. Normal range of motion.     Cervical back: Normal range of motion and neck supple.  Skin:    General: Skin is warm and dry.  Neurological:     Mental Status: She is alert and oriented to person, place, and time. Mental status is at baseline.     Cranial Nerves: No cranial nerve deficit.     Coordination: Coordination normal.       LABORATORY DATA:  I have reviewed the data as listed    Latest Ref Rng & Units 07/23/2023    8:46 AM 07/03/2023    8:44 AM 06/19/2023    8:14 AM  CBC  WBC 4.0 - 10.5 K/uL 5.0  6.2  7.2   Hemoglobin 12.0 - 15.0 g/dL 09.3  23.5  57.3   Hematocrit 36.0 - 46.0 % 34.9  36.4  36.4   Platelets 150 - 400 K/uL 216  222  199       Latest Ref Rng & Units 07/23/2023    8:47 AM 07/03/2023    8:44 AM 06/19/2023    8:14 AM  CMP  Glucose 70 - 99 mg/dL 161  096  045   BUN 8 - 23 mg/dL 15  13  12    Creatinine 0.44 - 1.00 mg/dL 4.09  8.11  9.14   Sodium 135 - 145 mmol/L 137  137  138   Potassium 3.5 - 5.1 mmol/L 3.6  3.5  3.7   Chloride 98 - 111 mmol/L 102  105  105   CO2 22 - 32 mmol/L 28  27  26    Calcium 8.9 - 10.3 mg/dL 8.7  8.7  8.7   Total Protein 6.5 - 8.1 g/dL 6.5  6.5  6.3   Total Bilirubin 0.3 - 1.2 mg/dL 0.5  0.5  0.3   Alkaline Phos 38 - 126 U/L 71  85  75   AST 15 - 41 U/L 38  36  37   ALT 0 - 44 U/L 27  25  25       RADIOGRAPHIC STUDIES: I  have personally reviewed the radiological images as listed and agreed with the findings in the report. NM PET Image Restag (PS) Skull Base To Thigh  Result Date: 07/11/2023 CLINICAL DATA:  Subsequent treatment strategy for colorectal carcinoma. EXAM: NUCLEAR MEDICINE PET SKULL BASE TO THIGH TECHNIQUE: 8.66 mCi F-18 FDG was injected intravenously. Full-ring PET imaging was performed from the skull base to thigh after the radiotracer. CT data was obtained and used for attenuation correction and anatomic localization. Fasting blood glucose: 64 mg/dl COMPARISON:  CT chest, abdomen and pelvis 06/25/2023 and PET-CT from 09/19/2022 FINDINGS: Mediastinal blood pool activity: SUV max 2.37 Liver activity: SUV max NA NECK: No hypermetabolic lymph nodes in the neck. Incidental CT findings: None. CHEST: Left supraclavicular lymph node measures 0.6 cm within SUV max of 4.85, image 32/6. This is new when compared with the previous PET-CT. On 06/25/2023 this measured the same. Multifocal bilateral pulmonary nodules are identified compatible with pulmonary metastasis. -Index nodule within the right middle lobe measures 1.4 cm with SUV max of 4.30 unchanged in size from 06/25/2023. On the PET-CT from 09/19/2022 this measured 1.2 cm and had an SUV max of 0.74. -Additional scattered lung nodules are identified throughout both lungs most of which are too small to characterize by PET-CT. The largest of these is in the anteromedial right middle lobe measuring 8 mm with SUV max of 1.22, image 37/2. Stable from 06/25/2023. On the PET-CT from 09/19/2022 this measured 8 mm with SUV max of 1.17. Incidental CT findings: No pleural effusion or consolidative change. Aortic atherosclerotic calcifications. ABDOMEN/PELVIS: No tracer avid liver metastases. There is no abnormal tracer uptake identified within the liver, pancreas, or spleen. Multifocal tracer avid abdominopelvic lymph nodes are identified. Tracer avid left periaortic lymph node  measures 8 mm within SUV max of 8.36. This is new compared with the previous exam. Right common iliac lymph node measures 1 cm and has an SUV max of 6.6, image 107/6. This is also new compared with the prior exam. Right external iliac lymph node measures 7 mm within SUV max of 6.06. This is similar to the recent CT from 06/25/2023. On the previous PET-CT from 09/19/22 this measured 7 mm with SUV max of 7.7. More distally, there is a new tracer avid right external iliac lymph node which measures 1.4 cm within SUV max of  6.63. Right inguinal lymph node measures 1.1 cm within SUV max of 5.16. Formally this measured 0.9 cm with SUV max of 5.13. No tracer avid peritoneal nodule Incidental CT findings: Left lower quadrant descending colostomy. Aortic atherosclerosis. SKELETON: Diffuse increased radiotracer uptake throughout the marrow space is identified diminishing sensitivity for detecting osseous metastasis. No abnormal increased radiotracer uptake identified above background increased bone marrow activity to indicate metabolically active bone metastases. Incidental CT findings: None. IMPRESSION: 1. Multifocal tracer avid abdominopelvic lymph nodes are identified compatible with metastatic disease. Although similar when compared with 06/25/2023, there are new tracer avid abdominopelvic lymph nodes identified when compared with the previous PET-CT from 09/19/2022. 2. Multifocal bilateral pulmonary nodules are identified compatible with pulmonary metastasis. The largest of these is in the right middle lobe measuring 1.4 cm with SUV max of 4.30. This is unchanged in size from 06/25/2023. On the PET-CT from 09/19/2022 this measured 1.2 cm and had an SUV max of 0.74. The remaining lung nodules are too small to characterize by PET-CT. 3. Diffuse increased radiotracer uptake throughout the marrow space is identified diminishing sensitivity for detecting osseous metastasis. No abnormal increased radiotracer uptake identified  above background increased bone marrow activity to indicate metabolically active bone metastases. 4.  Aortic Atherosclerosis (ICD10-I70.0). Electronically Signed   By: Signa Kell M.D.   On: 07/11/2023 12:08   CT CHEST ABDOMEN PELVIS W CONTRAST  Result Date: 06/25/2023 CLINICAL DATA:  Follow-up metastatic rectal cancer. * Tracking Code: BO * EXAM: CT CHEST, ABDOMEN, AND PELVIS WITH CONTRAST TECHNIQUE: Multidetector CT imaging of the chest, abdomen and pelvis was performed following the standard protocol during bolus administration of intravenous contrast. RADIATION DOSE REDUCTION: This exam was performed according to the departmental dose-optimization program which includes automated exposure control, adjustment of the mA and/or kV according to patient size and/or use of iterative reconstruction technique. CONTRAST:  85mL OMNIPAQUE IOHEXOL 300 MG/ML  SOLN COMPARISON:  Multiple priors including most recent CT March 20, 2023. FINDINGS: CT CHEST FINDINGS Cardiovascular: Right chest Port-A-Cath with tip in the right atrium. Aortic atherosclerosis. Enlarged main pulmonary artery measuring 3.6 cm. Coronary artery calcifications normal size heart. No significant pericardial effusion/thickening. Mediastinum/Nodes: No suspicious thyroid nodule. No pathologically enlarged mediastinal, hilar or axillary lymph nodes esophagus is grossly unremarkable Lungs/Pleura: Increased size of the solid bilateral pulmonary nodules. For reference: -medial right middle lobe pulmonary nodule measures 17 x 13 mm on image 102/3 previously 14 x 11 mm. No pleural effusion. No pneumothorax. Scattered atelectasis/scarring. Musculoskeletal: Thoracic spondylosis. CT ABDOMEN PELVIS FINDINGS Hepatobiliary: Stable 8 mm subcapsular lesion in the anterior inferior right hepatic lobe segment V on image 71/2 common non FDG avid on prior PET-CT stable over multiple prior examinations favored benign. No discrete suspicious hepatic lesion identified.  Gallbladder surgically absent. Similar mild dilation of the biliary tree with the common duct measuring 10 mm but smoothly tapering to the level of the ampulla and favored reservoir effect post cholecystectomy Pancreas: No pancreatic ductal dilation or evidence of acute inflammation. Spleen: No splenomegaly.  Stable 11 mm splenic cyst on image 61/2 Adrenals/Urinary Tract: Bilateral adrenal glands appear normal. No hydronephrosis stable benign bilateral renal cysts, including a 2.1 cm lesion in the lateral interpolar left kidney which measures Hounsfield units slightly greater than that of simple fluid but was previously classifies as a cyst on MRI March 16, 2020, also stable are the hypodense renal lesions technically too small to accurately characterize but statistically likely to reflect cysts. These are considered benign and  requiring no independent imaging follow-up. Urinary bladder is unremarkable for degree of distension Stomach/Bowel: Radiopaque enteric contrast material traverses the ileocecal valve. Stomach is unremarkable for degree of distension. No pathologic dilation of small or large bowel. Normal appendix. Colonic diverticulosis without findings of acute diverticulitis Prior abdominoperineal resection with mid transverse end colostomy in the left anterior abdominal wall. Fat containing peristomal hernia. Vascular/Lymphatic: Aortic atherosclerosis. Smooth IVC contours. The portal, splenic and superior mesenteric veins are patent. Previously indexed right external iliac lymph node measures 11 mm in short axis on image 100/2 previously 6 mm. Previously indexed inguinal lymph node measures 14 mm in short axis on image 107/2 previously 11 mm. Reproductive: Status post hysterectomy and bilateral salpingo-oophorectomy. Other: No significant abdominopelvic free fluid. Fat containing peristomal hernia Musculoskeletal: No significant interval change in the left iliac bone osseous metastasis on image 83/2.  Multilevel degenerative changes spine. Probable postradiation change in the sacrum IMPRESSION: 1. Increased size of the solid bilateral pulmonary nodules, consistent with worsening metastatic disease. 2. Increased size of the right external iliac and inguinal lymph nodes, concerning for worsening metastatic disease. 3. No significant interval change in the left iliac bone osseous metastasis. 4. Enlarged main pulmonary artery measuring 3.6 cm, which can be seen in the setting of pulmonary arterial hypertension. 5.  Aortic Atherosclerosis (ICD10-I70.0). Electronically Signed   By: Maudry Mayhew M.D.   On: 06/25/2023 11:35   US Venous Img Lower Unilateral Right  Result Date: 05/18/2023 CLINICAL DATA:  Right lower extremity swelling EXAM: RIGHT LOWER EXTREMITY VENOUS DOPPLER ULTRASOUND TECHNIQUE: Gray-scale sonography with compression, as well as color and duplex ultrasound, were performed to evaluate the deep venous system(s) from the level of the common femoral vein through the popliteal and proximal calf veins. COMPARISON:  01/13/2020 FINDINGS: VENOUS Small amount of nonocclusive thrombus noted in the right posterior tibial vein. The thrombus is echogenic and eccentric which suggests that it is chronic. Otherwise normal compressibility of the common femoral, superficial femoral, and popliteal veins, as well as the visualized calf veins. Visualized portions of profunda femoral vein and great saphenous vein unremarkable. No additional filling defects to suggest DVT on grayscale or color Doppler imaging. Doppler waveforms show normal direction of venous flow, normal respiratory plasticity and response to augmentation. Limited views of the contralateral common femoral vein are unremarkable. OTHER 3.9 x 1.1 x 1.2 cm fluid collection in the right popliteal fossa likely Baker's cyst. Limitations: none IMPRESSION: Small amount of chronic nonocclusive DVT noted in the right posterior tibial vein. Electronically Signed    By: Acquanetta Belling M.D.   On: 05/18/2023 14:36      NM PET Image Restag (PS) Skull Base To Thigh  Result Date: 07/11/2023 CLINICAL DATA:  Subsequent treatment strategy for colorectal carcinoma. EXAM: NUCLEAR MEDICINE PET SKULL BASE TO THIGH TECHNIQUE: 8.66 mCi F-18 FDG was injected intravenously. Full-ring PET imaging was performed from the skull base to thigh after the radiotracer. CT data was obtained and used for attenuation correction and anatomic localization. Fasting blood glucose: 64 mg/dl COMPARISON:  CT chest, abdomen and pelvis 06/25/2023 and PET-CT from 09/19/2022 FINDINGS: Mediastinal blood pool activity: SUV max 2.37 Liver activity: SUV max NA NECK: No hypermetabolic lymph nodes in the neck. Incidental CT findings: None. CHEST: Left supraclavicular lymph node measures 0.6 cm within SUV max of 4.85, image 32/6. This is new when compared with the previous PET-CT. On 06/25/2023 this measured the same. Multifocal bilateral pulmonary nodules are identified compatible with pulmonary metastasis. -Index nodule within  the right middle lobe measures 1.4 cm with SUV max of 4.30 unchanged in size from 06/25/2023. On the PET-CT from 09/19/2022 this measured 1.2 cm and had an SUV max of 0.74. -Additional scattered lung nodules are identified throughout both lungs most of which are too small to characterize by PET-CT. The largest of these is in the anteromedial right middle lobe measuring 8 mm with SUV max of 1.22, image 37/2. Stable from 06/25/2023. On the PET-CT from 09/19/2022 this measured 8 mm with SUV max of 1.17. Incidental CT findings: No pleural effusion or consolidative change. Aortic atherosclerotic calcifications. ABDOMEN/PELVIS: No tracer avid liver metastases. There is no abnormal tracer uptake identified within the liver, pancreas, or spleen. Multifocal tracer avid abdominopelvic lymph nodes are identified. Tracer avid left periaortic lymph node measures 8 mm within SUV max of 8.36. This is new  compared with the previous exam. Right common iliac lymph node measures 1 cm and has an SUV max of 6.6, image 107/6. This is also new compared with the prior exam. Right external iliac lymph node measures 7 mm within SUV max of 6.06. This is similar to the recent CT from 06/25/2023. On the previous PET-CT from 09/19/22 this measured 7 mm with SUV max of 7.7. More distally, there is a new tracer avid right external iliac lymph node which measures 1.4 cm within SUV max of 6.63. Right inguinal lymph node measures 1.1 cm within SUV max of 5.16. Formally this measured 0.9 cm with SUV max of 5.13. No tracer avid peritoneal nodule Incidental CT findings: Left lower quadrant descending colostomy. Aortic atherosclerosis. SKELETON: Diffuse increased radiotracer uptake throughout the marrow space is identified diminishing sensitivity for detecting osseous metastasis. No abnormal increased radiotracer uptake identified above background increased bone marrow activity to indicate metabolically active bone metastases. Incidental CT findings: None. IMPRESSION: 1. Multifocal tracer avid abdominopelvic lymph nodes are identified compatible with metastatic disease. Although similar when compared with 06/25/2023, there are new tracer avid abdominopelvic lymph nodes identified when compared with the previous PET-CT from 09/19/2022. 2. Multifocal bilateral pulmonary nodules are identified compatible with pulmonary metastasis. The largest of these is in the right middle lobe measuring 1.4 cm with SUV max of 4.30. This is unchanged in size from 06/25/2023. On the PET-CT from 09/19/2022 this measured 1.2 cm and had an SUV max of 0.74. The remaining lung nodules are too small to characterize by PET-CT. 3. Diffuse increased radiotracer uptake throughout the marrow space is identified diminishing sensitivity for detecting osseous metastasis. No abnormal increased radiotracer uptake identified above background increased bone marrow activity to  indicate metabolically active bone metastases. 4.  Aortic Atherosclerosis (ICD10-I70.0). Electronically Signed   By: Signa Kell M.D.   On: 07/11/2023 12:08   CT CHEST ABDOMEN PELVIS W CONTRAST  Result Date: 06/25/2023 CLINICAL DATA:  Follow-up metastatic rectal cancer. * Tracking Code: BO * EXAM: CT CHEST, ABDOMEN, AND PELVIS WITH CONTRAST TECHNIQUE: Multidetector CT imaging of the chest, abdomen and pelvis was performed following the standard protocol during bolus administration of intravenous contrast. RADIATION DOSE REDUCTION: This exam was performed according to the departmental dose-optimization program which includes automated exposure control, adjustment of the mA and/or kV according to patient size and/or use of iterative reconstruction technique. CONTRAST:  85mL OMNIPAQUE IOHEXOL 300 MG/ML  SOLN COMPARISON:  Multiple priors including most recent CT March 20, 2023. FINDINGS: CT CHEST FINDINGS Cardiovascular: Right chest Port-A-Cath with tip in the right atrium. Aortic atherosclerosis. Enlarged main pulmonary artery measuring 3.6 cm. Coronary  artery calcifications normal size heart. No significant pericardial effusion/thickening. Mediastinum/Nodes: No suspicious thyroid nodule. No pathologically enlarged mediastinal, hilar or axillary lymph nodes esophagus is grossly unremarkable Lungs/Pleura: Increased size of the solid bilateral pulmonary nodules. For reference: -medial right middle lobe pulmonary nodule measures 17 x 13 mm on image 102/3 previously 14 x 11 mm. No pleural effusion. No pneumothorax. Scattered atelectasis/scarring. Musculoskeletal: Thoracic spondylosis. CT ABDOMEN PELVIS FINDINGS Hepatobiliary: Stable 8 mm subcapsular lesion in the anterior inferior right hepatic lobe segment V on image 71/2 common non FDG avid on prior PET-CT stable over multiple prior examinations favored benign. No discrete suspicious hepatic lesion identified. Gallbladder surgically absent. Similar mild dilation of  the biliary tree with the common duct measuring 10 mm but smoothly tapering to the level of the ampulla and favored reservoir effect post cholecystectomy Pancreas: No pancreatic ductal dilation or evidence of acute inflammation. Spleen: No splenomegaly.  Stable 11 mm splenic cyst on image 61/2 Adrenals/Urinary Tract: Bilateral adrenal glands appear normal. No hydronephrosis stable benign bilateral renal cysts, including a 2.1 cm lesion in the lateral interpolar left kidney which measures Hounsfield units slightly greater than that of simple fluid but was previously classifies as a cyst on MRI March 16, 2020, also stable are the hypodense renal lesions technically too small to accurately characterize but statistically likely to reflect cysts. These are considered benign and requiring no independent imaging follow-up. Urinary bladder is unremarkable for degree of distension Stomach/Bowel: Radiopaque enteric contrast material traverses the ileocecal valve. Stomach is unremarkable for degree of distension. No pathologic dilation of small or large bowel. Normal appendix. Colonic diverticulosis without findings of acute diverticulitis Prior abdominoperineal resection with mid transverse end colostomy in the left anterior abdominal wall. Fat containing peristomal hernia. Vascular/Lymphatic: Aortic atherosclerosis. Smooth IVC contours. The portal, splenic and superior mesenteric veins are patent. Previously indexed right external iliac lymph node measures 11 mm in short axis on image 100/2 previously 6 mm. Previously indexed inguinal lymph node measures 14 mm in short axis on image 107/2 previously 11 mm. Reproductive: Status post hysterectomy and bilateral salpingo-oophorectomy. Other: No significant abdominopelvic free fluid. Fat containing peristomal hernia Musculoskeletal: No significant interval change in the left iliac bone osseous metastasis on image 83/2. Multilevel degenerative changes spine. Probable postradiation  change in the sacrum IMPRESSION: 1. Increased size of the solid bilateral pulmonary nodules, consistent with worsening metastatic disease. 2. Increased size of the right external iliac and inguinal lymph nodes, concerning for worsening metastatic disease. 3. No significant interval change in the left iliac bone osseous metastasis. 4. Enlarged main pulmonary artery measuring 3.6 cm, which can be seen in the setting of pulmonary arterial hypertension. 5.  Aortic Atherosclerosis (ICD10-I70.0). Electronically Signed   By: Maudry Mayhew M.D.   On: 06/25/2023 11:35   US Venous Img Lower Unilateral Right  Result Date: 05/18/2023 CLINICAL DATA:  Right lower extremity swelling EXAM: RIGHT LOWER EXTREMITY VENOUS DOPPLER ULTRASOUND TECHNIQUE: Gray-scale sonography with compression, as well as color and duplex ultrasound, were performed to evaluate the deep venous system(s) from the level of the common femoral vein through the popliteal and proximal calf veins. COMPARISON:  01/13/2020 FINDINGS: VENOUS Small amount of nonocclusive thrombus noted in the right posterior tibial vein. The thrombus is echogenic and eccentric which suggests that it is chronic. Otherwise normal compressibility of the common femoral, superficial femoral, and popliteal veins, as well as the visualized calf veins. Visualized portions of profunda femoral vein and great saphenous vein unremarkable. No additional filling defects  to suggest DVT on grayscale or color Doppler imaging. Doppler waveforms show normal direction of venous flow, normal respiratory plasticity and response to augmentation. Limited views of the contralateral common femoral vein are unremarkable. OTHER 3.9 x 1.1 x 1.2 cm fluid collection in the right popliteal fossa likely Baker's cyst. Limitations: none IMPRESSION: Small amount of chronic nonocclusive DVT noted in the right posterior tibial vein. Electronically Signed   By: Acquanetta Belling M.D.   On: 05/18/2023 14:36

## 2023-07-23 NOTE — Assessment & Plan Note (Signed)
History of PE and bilateral lower extremity DVT  05/18/23 Repeat right LE US showed chronic non occlusive DVT Continue  Eliquis 2.5 mg twice daily for anticoagulation prophylaxis. Recommend leg elevation and compression stocking.

## 2023-07-23 NOTE — Assessment & Plan Note (Addendum)
History of stage IIIC Rectal cancer, s/p TNT, followed by 09/17/19 APR/posterior vaginectomy/TAH/BSO/VY-flap, pT4b pN0 with close vaginal margin 0.2 mm.  Uterus and ovaries negative for malignancy. palliative radiation to vaginal recurrence- 01/19/21 recurrence with lung metastasis.-Palliative -FOLFIRI plus bevacizumab.  Irinotecan was dropped in November 2022 due to side effects. Negative for UGT1A1*28 - radiographically stable, rise of CEA-July 2023 CT lung metastasis worse--> Dec 2023 PET showed progression in pelvic lymph nodes and bone lesions,--> 2nd line irinotecan +panitumumab-->CT March 2024 stable. --> June 2024 CT showed mild lung progression/Progressive left iliac bone metastasis, vulvar mass difficult for biopsy in office,  [facility]us Liquid biopsy - no actionable variants --> 06/25/2023 CT showed progression of lung nodules [SLD increase 45mm] and iliac inguinal lymphadenopathy--> 07/23/23 switch to dose reduced FOLFOX   Labs are reviewed and discussed with patient, CEA nadir was in 490, currently gradually increasing Proceed with dose reduced FOLFOX

## 2023-07-23 NOTE — Assessment & Plan Note (Signed)
Continue potassium chloride 20 mEq daily.

## 2023-07-23 NOTE — Assessment & Plan Note (Signed)
Chemotherapy plan as listed above 

## 2023-07-23 NOTE — Patient Instructions (Signed)
[facility]  Discharge Instructions: Thank you for choosing [facility] to provide your oncology and hematology care.  If you have a lab appointment with the [facility], please go directly to the [facility] and check in at the registration area.  Wear comfortable clothing and clothing appropriate for easy access to any Portacath or PICC line.   We strive to give you quality time with your provider. You may need to reschedule your appointment if you arrive late (15 or more minutes).  Arriving late affects you and other patients whose appointments are after yours.  Also, if you miss three or more appointments without notifying the office, you may be dismissed from the clinic at the provider's discretion.      For prescription refill requests, have your pharmacy contact our office and allow 72 hours for refills to be completed.    Today you received the following chemotherapy and/or immunotherapy agents Oxaliplatin, Leucovorin, Adrucil       To help prevent nausea and vomiting after your treatment, we encourage you to take your nausea medication as directed.  BELOW ARE SYMPTOMS THAT SHOULD BE REPORTED IMMEDIATELY: *FEVER GREATER THAN 100.4 F (38 C) OR HIGHER *CHILLS OR SWEATING *NAUSEA AND VOMITING THAT IS NOT CONTROLLED WITH YOUR NAUSEA MEDICATION *UNUSUAL SHORTNESS OF BREATH *UNUSUAL BRUISING OR BLEEDING *URINARY PROBLEMS (pain or burning when urinating, or frequent urination) *BOWEL PROBLEMS (unusual diarrhea, constipation, pain near the anus) TENDERNESS IN MOUTH AND THROAT WITH OR WITHOUT PRESENCE OF ULCERS (sore throat, sores in mouth, or a toothache) UNUSUAL RASH, SWELLING OR PAIN  UNUSUAL VAGINAL DISCHARGE OR ITCHING   Items with * indicate a potential emergency and should be followed up as soon as possible or go to the Emergency Department if any problems should occur.  Please show the CHEMOTHERAPY ALERT CARD or IMMUNOTHERAPY  ALERT CARD at check-in to the Emergency Department and triage nurse.  Should you have questions after your visit or need to cancel or reschedule your appointment, please contact [facility]  [phone] and follow the prompts.  Office hours are 8:00 a.m. to 4:30 p.m. Monday - Friday. Please note that voicemails left after 4:00 p.m. may not be returned until the following business day.  We are closed weekends and major holidays. You have access to a nurse at all times for urgent questions. Please call the main number to the clinic [phone] and follow the prompts.  For any non-urgent questions, you may also contact your provider using MyChart. We now offer e-Visits for anyone 40 and older to request care online for non-urgent symptoms. For details visit [email or web address].   Also download the MyChart app! Go to the app store, search "MyChart", open the app, select [facility], and log in with your MyChart username and password.

## 2023-07-23 NOTE — Assessment & Plan Note (Signed)
If magnesium is 1.0 - 1.2:  administrate 6 gm IV magnesium sulfate If magnesium is 1.2-1.4, administrate 4 gm IV magnesium sulfate If magnesium is 1.5-1.6:  administrate 2 gm IV magnesium sulfate.  Continue slow mag orally 64mg twice daily.  

## 2023-07-23 NOTE — Assessment & Plan Note (Signed)
 continue lomotil  QID PRN as instructed.

## 2023-07-24 LAB — CEA: CEA: 1897 ng/mL — ABNORMAL HIGH (ref 0.0–4.7)

## 2023-07-25 ENCOUNTER — Inpatient Hospital Stay: Payer: 59

## 2023-07-25 VITALS — BP 120/79 | HR 86 | Resp 18

## 2023-07-25 DIAGNOSIS — K219 Gastro-esophageal reflux disease without esophagitis: Secondary | ICD-10-CM | POA: Diagnosis not present

## 2023-07-25 DIAGNOSIS — Z86711 Personal history of pulmonary embolism: Secondary | ICD-10-CM | POA: Diagnosis not present

## 2023-07-25 DIAGNOSIS — Z79899 Other long term (current) drug therapy: Secondary | ICD-10-CM | POA: Diagnosis not present

## 2023-07-25 DIAGNOSIS — D6481 Anemia due to antineoplastic chemotherapy: Secondary | ICD-10-CM | POA: Diagnosis not present

## 2023-07-25 DIAGNOSIS — R590 Localized enlarged lymph nodes: Secondary | ICD-10-CM | POA: Diagnosis not present

## 2023-07-25 DIAGNOSIS — R600 Localized edema: Secondary | ICD-10-CM | POA: Diagnosis not present

## 2023-07-25 DIAGNOSIS — I2510 Atherosclerotic heart disease of native coronary artery without angina pectoris: Secondary | ICD-10-CM | POA: Diagnosis not present

## 2023-07-25 DIAGNOSIS — L600 Ingrowing nail: Secondary | ICD-10-CM | POA: Diagnosis not present

## 2023-07-25 DIAGNOSIS — I700 Atherosclerosis of aorta: Secondary | ICD-10-CM | POA: Diagnosis not present

## 2023-07-25 DIAGNOSIS — C20 Malignant neoplasm of rectum: Secondary | ICD-10-CM

## 2023-07-25 DIAGNOSIS — I82501 Chronic embolism and thrombosis of unspecified deep veins of right lower extremity: Secondary | ICD-10-CM | POA: Diagnosis not present

## 2023-07-25 DIAGNOSIS — Z87891 Personal history of nicotine dependence: Secondary | ICD-10-CM | POA: Diagnosis not present

## 2023-07-25 DIAGNOSIS — E7800 Pure hypercholesterolemia, unspecified: Secondary | ICD-10-CM | POA: Diagnosis not present

## 2023-07-25 DIAGNOSIS — C7951 Secondary malignant neoplasm of bone: Secondary | ICD-10-CM | POA: Diagnosis not present

## 2023-07-25 DIAGNOSIS — C799 Secondary malignant neoplasm of unspecified site: Secondary | ICD-10-CM

## 2023-07-25 DIAGNOSIS — E876 Hypokalemia: Secondary | ICD-10-CM | POA: Diagnosis not present

## 2023-07-25 DIAGNOSIS — Z7901 Long term (current) use of anticoagulants: Secondary | ICD-10-CM | POA: Diagnosis not present

## 2023-07-25 DIAGNOSIS — N182 Chronic kidney disease, stage 2 (mild): Secondary | ICD-10-CM | POA: Diagnosis not present

## 2023-07-25 DIAGNOSIS — Z5111 Encounter for antineoplastic chemotherapy: Secondary | ICD-10-CM | POA: Diagnosis not present

## 2023-07-25 DIAGNOSIS — I129 Hypertensive chronic kidney disease with stage 1 through stage 4 chronic kidney disease, or unspecified chronic kidney disease: Secondary | ICD-10-CM | POA: Diagnosis not present

## 2023-07-25 MED ORDER — SODIUM CHLORIDE 0.9% FLUSH
10.0000 mL | INTRAVENOUS | Status: DC | PRN
  Administered 2023-07-25: 10 mL
  Filled 2023-07-25: qty 10

## 2023-07-25 MED ORDER — HEPARIN SOD (PORK) LOCK FLUSH 100 UNIT/ML IV SOLN
500.0000 [IU] | Freq: Once | INTRAVENOUS | Status: AC | PRN
  Administered 2023-07-25: 500 [IU]
  Filled 2023-07-25: qty 5

## 2023-08-01 DIAGNOSIS — Z556 Problems related to health literacy: Secondary | ICD-10-CM | POA: Diagnosis not present

## 2023-08-01 DIAGNOSIS — B372 Candidiasis of skin and nail: Secondary | ICD-10-CM | POA: Diagnosis not present

## 2023-08-01 DIAGNOSIS — Z7901 Long term (current) use of anticoagulants: Secondary | ICD-10-CM | POA: Diagnosis not present

## 2023-08-01 DIAGNOSIS — Z79899 Other long term (current) drug therapy: Secondary | ICD-10-CM | POA: Diagnosis not present

## 2023-08-01 DIAGNOSIS — Z87891 Personal history of nicotine dependence: Secondary | ICD-10-CM | POA: Diagnosis not present

## 2023-08-01 DIAGNOSIS — I10 Essential (primary) hypertension: Secondary | ICD-10-CM | POA: Diagnosis not present

## 2023-08-01 DIAGNOSIS — L89152 Pressure ulcer of sacral region, stage 2: Secondary | ICD-10-CM | POA: Diagnosis not present

## 2023-08-02 ENCOUNTER — Encounter: Payer: Self-pay | Admitting: Oncology

## 2023-08-03 ENCOUNTER — Ambulatory Visit (INDEPENDENT_AMBULATORY_CARE_PROVIDER_SITE_OTHER): Payer: 59 | Admitting: Podiatry

## 2023-08-03 ENCOUNTER — Encounter: Payer: Self-pay | Admitting: Podiatry

## 2023-08-03 DIAGNOSIS — M79675 Pain in left toe(s): Secondary | ICD-10-CM | POA: Diagnosis not present

## 2023-08-03 DIAGNOSIS — G620 Drug-induced polyneuropathy: Secondary | ICD-10-CM | POA: Diagnosis not present

## 2023-08-03 DIAGNOSIS — M79674 Pain in right toe(s): Secondary | ICD-10-CM | POA: Diagnosis not present

## 2023-08-03 DIAGNOSIS — B351 Tinea unguium: Secondary | ICD-10-CM

## 2023-08-03 DIAGNOSIS — L600 Ingrowing nail: Secondary | ICD-10-CM

## 2023-08-03 DIAGNOSIS — T451X5A Adverse effect of antineoplastic and immunosuppressive drugs, initial encounter: Secondary | ICD-10-CM | POA: Diagnosis not present

## 2023-08-03 DIAGNOSIS — D689 Coagulation defect, unspecified: Secondary | ICD-10-CM | POA: Diagnosis not present

## 2023-08-03 MED ORDER — GENTAMICIN SULFATE 0.1 % EX CREA
TOPICAL_CREAM | CUTANEOUS | 1 refills | Status: DC
Start: 2023-08-03 — End: 2024-03-05

## 2023-08-03 MED ORDER — DOXYCYCLINE HYCLATE 100 MG PO CAPS
100.0000 mg | ORAL_CAPSULE | Freq: Two times a day (BID) | ORAL | 0 refills | Status: AC
Start: 2023-08-03 — End: 2023-08-13

## 2023-08-03 NOTE — Progress Notes (Signed)
Subjective:  Patient ID: Jacqueline Yoder, female    DOB: 27-Sep-1946,  MRN: [id number]  77 y.o. female presents at risk foot care with h/o neuropathy secondary to chemotherapy and painful thick toenails that are difficult to trim. Pain interferes with ambulation. Aggravating factors include wearing enclosed shoe gear. Pain is relieved with periodic professional debridement.  Patient relates tenderness to right great toe at base of nailplate. States pain started 2 weeks ago. She has been applying antibiotic cream and band-aid. Denies any fever, chills, night sweats, nausea or vomiting.  She is actively undergoing chemotherapy and has treatment scheduled Monday, October, 21, 2024.  New problem(s): None   PCP is Smitty Cords, DO , and last visit was July 11, 2023.  Allergies  Allergen Reactions   Sulfamethoxazole-Trimethoprim Other (See Comments)    Also said she had chills and pressure in nose.   Review of Systems: Negative except as noted in the HPI.   Objective:  ARANSA KRAATZ is a pleasant 77 y.o. female WD, WN in NAD.Marland Kitchen AAO x 3.  Vascular Examination: Capillary refill time <3 seconds b/l LE. Palpable pedal pulses b/l LE. Digital hair decreased. No pedal edema b/l. Skin temperature gradient WNL b/l. +Mild varicosities.  Dermatological Examination: Right great toe with nail border hypertrophy and tenderness to palpation noted at proximal nail fold. No purulence expressed. No drainage nor malodor. Mild erythema at proximal nailfold with no extension proximally.  Pedal skin with normal turgor, texture and tone b/l. No open wounds. No interdigital macerations b/l.   Toenails left great toe and 2-5 b/l thickened, discolored, dystrophic with subungual debris. There is pain on palpation to dorsal aspect of nailplates.   Subungual hematoma of the left great toe has resolved.   No hyperkeratotic nor porokeratotic lesions present on today's visit.Marland Kitchen  Neurological  Examination: Protective sensation diminished with 10g monofilament b/l.  Musculoskeletal Examination: Muscle strength 5/5 to all lower extremity muscle groups bilaterally. No pain, crepitus or joint limitation noted with ROM bilateral LE.  Last A1c:      Latest Ref Rng & Units 07/04/2023    8:45 AM  Hemoglobin A1C  Hemoglobin-A1c <5.7 % of total Hgb 5.1       Latest Ref Rng & Units 07/23/2023    8:46 AM 07/03/2023    8:44 AM 06/19/2023    8:14 AM  CBC  WBC 4.0 - 10.5 K/uL 5.0  6.2  7.2   Hemoglobin 12.0 - 15.0 g/dL 40.9  81.1  91.4   Hematocrit 36.0 - 46.0 % 34.9  36.4  36.4   Platelets 150 - 400 K/uL 216  222  199       Assessment:   1. Pain due to onychomycosis of toenails of both feet   2. Ingrown toenail without infection   3. Coagulation defect (HCC)   4. Chemotherapy-induced neuropathy (HCC)    Plan:  -Patient was evaluated and treated. All patient's and/or POA's questions/concerns answered on today's visit. -Debrided and curretaged symptomatic nail border. Cleansed with alcohol. Applied TAO and fabric band-aid. Dispensed epsom salt soaks instructions. Placed on doxycycline 100 mg po bid x 10 days. Patient noted toe felt better after today's treatment. Advised her to inform Oncology staff of her toe. She related understanding. -Discussed ingrown toenail right great toe. Recommended temporary removal to avoid problems while she is having chemotherapy. She will see Dr. Allena Katz for this next week. I have sent Rx for doxycycline 100 mg po bid and Gentamicin Cream to apply  to the toe daily. -Toenails were debrided in length and girth left great toe and toes 2-5 b/l with sterile nail nippers and dremel without iatrogenic bleeding.  -Patient/POA to call should there be question/concern in the interim.  Return in about 3 months (around 11/03/2023).  Freddie Breech, DPM

## 2023-08-03 NOTE — Patient Instructions (Signed)
EPSOM SALT FOOT SOAK INSTRUCTIONS  *IF YOU HAVE BEEN PRESCRIBED ANTIBIOTICS, TAKE AS INSTRUCTED UNTIL ALL ARE GONE*  Shopping List:  A. Plain epsom salt (not scented) B. GENTAMICIN CREAM C. 1-inch fabric band-aids   Place 1/4 cup of epsom salts in 2 quarts of warm tap water. IF YOU ARE DIABETIC, OR HAVE NEUROPATHY, CHECK THE TEMPERATURE OF THE WATER WITH YOUR ELBOW.   Submerge your foot/feet in the solution and soak for 10-15 minutes.      3.  Next, remove your foot/feet from solution, blot dry the affected area.    4.  Apply light amount of antibiotic cream/ointment and cover with fabric band-aid .  5.  This soak should be done once a day for 7 days.   6.  Monitor for any signs/symptoms of infection such as redness, swelling, odor, drainage, increased pain, or non-healing of digit.   7.  Please do not hesitate to call the office and speak to a Nurse or Doctor if you have questions.   8.  If you experience fever, chills, nightsweats, nausea or vomiting with worsening of digit/foot, please go to the emergency room.

## 2023-08-06 ENCOUNTER — Encounter: Payer: Self-pay | Admitting: Oncology

## 2023-08-06 ENCOUNTER — Inpatient Hospital Stay (HOSPITAL_BASED_OUTPATIENT_CLINIC_OR_DEPARTMENT_OTHER): Payer: 59 | Admitting: Oncology

## 2023-08-06 ENCOUNTER — Inpatient Hospital Stay: Payer: 59

## 2023-08-06 VITALS — BP 105/63 | HR 83 | Temp 96.5°F | Resp 20 | Wt 160.5 lb

## 2023-08-06 DIAGNOSIS — N182 Chronic kidney disease, stage 2 (mild): Secondary | ICD-10-CM | POA: Diagnosis not present

## 2023-08-06 DIAGNOSIS — E876 Hypokalemia: Secondary | ICD-10-CM | POA: Diagnosis not present

## 2023-08-06 DIAGNOSIS — M7989 Other specified soft tissue disorders: Secondary | ICD-10-CM

## 2023-08-06 DIAGNOSIS — E7800 Pure hypercholesterolemia, unspecified: Secondary | ICD-10-CM | POA: Diagnosis not present

## 2023-08-06 DIAGNOSIS — C7951 Secondary malignant neoplasm of bone: Secondary | ICD-10-CM | POA: Diagnosis not present

## 2023-08-06 DIAGNOSIS — L600 Ingrowing nail: Secondary | ICD-10-CM | POA: Diagnosis not present

## 2023-08-06 DIAGNOSIS — Z86711 Personal history of pulmonary embolism: Secondary | ICD-10-CM | POA: Diagnosis not present

## 2023-08-06 DIAGNOSIS — I82501 Chronic embolism and thrombosis of unspecified deep veins of right lower extremity: Secondary | ICD-10-CM | POA: Diagnosis not present

## 2023-08-06 DIAGNOSIS — R600 Localized edema: Secondary | ICD-10-CM | POA: Diagnosis not present

## 2023-08-06 DIAGNOSIS — T451X5A Adverse effect of antineoplastic and immunosuppressive drugs, initial encounter: Secondary | ICD-10-CM | POA: Diagnosis not present

## 2023-08-06 DIAGNOSIS — Z79899 Other long term (current) drug therapy: Secondary | ICD-10-CM | POA: Diagnosis not present

## 2023-08-06 DIAGNOSIS — N939 Abnormal uterine and vaginal bleeding, unspecified: Secondary | ICD-10-CM

## 2023-08-06 DIAGNOSIS — Z95828 Presence of other vascular implants and grafts: Secondary | ICD-10-CM

## 2023-08-06 DIAGNOSIS — I2510 Atherosclerotic heart disease of native coronary artery without angina pectoris: Secondary | ICD-10-CM | POA: Diagnosis not present

## 2023-08-06 DIAGNOSIS — R590 Localized enlarged lymph nodes: Secondary | ICD-10-CM | POA: Diagnosis not present

## 2023-08-06 DIAGNOSIS — Z87891 Personal history of nicotine dependence: Secondary | ICD-10-CM | POA: Diagnosis not present

## 2023-08-06 DIAGNOSIS — D6481 Anemia due to antineoplastic chemotherapy: Secondary | ICD-10-CM | POA: Diagnosis not present

## 2023-08-06 DIAGNOSIS — C799 Secondary malignant neoplasm of unspecified site: Secondary | ICD-10-CM

## 2023-08-06 DIAGNOSIS — I700 Atherosclerosis of aorta: Secondary | ICD-10-CM | POA: Diagnosis not present

## 2023-08-06 DIAGNOSIS — C20 Malignant neoplasm of rectum: Secondary | ICD-10-CM | POA: Diagnosis not present

## 2023-08-06 DIAGNOSIS — K219 Gastro-esophageal reflux disease without esophagitis: Secondary | ICD-10-CM | POA: Diagnosis not present

## 2023-08-06 DIAGNOSIS — Z5111 Encounter for antineoplastic chemotherapy: Secondary | ICD-10-CM | POA: Diagnosis not present

## 2023-08-06 DIAGNOSIS — Z7901 Long term (current) use of anticoagulants: Secondary | ICD-10-CM | POA: Diagnosis not present

## 2023-08-06 DIAGNOSIS — I129 Hypertensive chronic kidney disease with stage 1 through stage 4 chronic kidney disease, or unspecified chronic kidney disease: Secondary | ICD-10-CM | POA: Diagnosis not present

## 2023-08-06 LAB — CMP (CANCER CENTER ONLY)
ALT: 26 U/L (ref 0–44)
AST: 41 U/L (ref 15–41)
Albumin: 3.6 g/dL (ref 3.5–5.0)
Alkaline Phosphatase: 60 U/L (ref 38–126)
Anion gap: 10 (ref 5–15)
BUN: 13 mg/dL (ref 8–23)
CO2: 27 mmol/L (ref 22–32)
Calcium: 8.6 mg/dL — ABNORMAL LOW (ref 8.9–10.3)
Chloride: 101 mmol/L (ref 98–111)
Creatinine: 0.6 mg/dL (ref 0.44–1.00)
GFR, Estimated: >60 mL/min (ref >60)
Glucose, Bld: 109 mg/dL — ABNORMAL HIGH (ref 70–99)
Potassium: 3.5 mmol/L (ref 3.5–5.1)
Sodium: 138 mmol/L (ref 135–145)
Total Bilirubin: 0.4 mg/dL (ref 0.3–1.2)
Total Protein: 6.5 g/dL (ref 6.5–8.1)

## 2023-08-06 LAB — CBC WITH DIFFERENTIAL (CANCER CENTER ONLY)
Abs Immature Granulocytes: 0.01 10*3/uL (ref 0.00–0.07)
Basophils Absolute: 0 10*3/uL (ref 0.0–0.1)
Basophils Relative: 1 %
Eosinophils Absolute: 0.1 10*3/uL (ref 0.0–0.5)
Eosinophils Relative: 2 %
HCT: 34.3 % — ABNORMAL LOW (ref 36.0–46.0)
Hemoglobin: 10.9 g/dL — ABNORMAL LOW (ref 12.0–15.0)
Immature Granulocytes: 0 %
Lymphocytes Relative: 23 %
Lymphs Abs: 0.9 10*3/uL (ref 0.7–4.0)
MCH: 30 pg (ref 26.0–34.0)
MCHC: 31.8 g/dL (ref 30.0–36.0)
MCV: 94.5 fL (ref 80.0–100.0)
Monocytes Absolute: 0.4 10*3/uL (ref 0.1–1.0)
Monocytes Relative: 9 %
Neutro Abs: 2.7 10*3/uL (ref 1.7–7.7)
Neutrophils Relative %: 65 %
Platelet Count: 262 10*3/uL (ref 150–400)
RBC: 3.63 MIL/uL — ABNORMAL LOW (ref 3.87–5.11)
RDW: 15.5 % (ref 11.5–15.5)
WBC Count: 4.1 10*3/uL (ref 4.0–10.5)
nRBC: 0 % (ref 0.0–0.2)

## 2023-08-06 LAB — MAGNESIUM: Magnesium: 1.3 mg/dL — ABNORMAL LOW (ref 1.7–2.4)

## 2023-08-06 MED ORDER — MAGNESIUM SULFATE 4 GM/100ML IV SOLN
4.0000 g | Freq: Once | INTRAVENOUS | Status: AC
  Administered 2023-08-06: 4 g via INTRAVENOUS
  Filled 2023-08-06: qty 100

## 2023-08-06 MED ORDER — SODIUM CHLORIDE 0.9 % IV SOLN
INTRAVENOUS | Status: DC
Start: 2023-08-06 — End: 2023-08-06
  Filled 2023-08-06: qty 250

## 2023-08-06 MED ORDER — HEPARIN SOD (PORK) LOCK FLUSH 100 UNIT/ML IV SOLN
500.0000 [IU] | Freq: Once | INTRAVENOUS | Status: AC
  Administered 2023-08-06: 500 [IU] via INTRAVENOUS
  Filled 2023-08-06: qty 5

## 2023-08-06 NOTE — Patient Instructions (Signed)
Magnesium Sulfate Injection What is this medication? MAGNESIUM SULFATE (mag NEE zee um SUL fate) prevents and treats low levels of magnesium in your body. It may also be used to prevent and treat seizures during pregnancy in people with high blood pressure disorders, such as preeclampsia or eclampsia. Magnesium plays an important role in maintaining the health of your muscles and nervous system. This medicine may be used for other purposes; ask your health care provider or pharmacist if you have questions. What should I tell my care team before I take this medication? They need to know if you have any of these conditions: Heart disease History of irregular heart beat Kidney disease An unusual or allergic reaction to magnesium sulfate, medications, foods, dyes, or preservatives Pregnant or trying to get pregnant Breast-feeding How should I use this medication? This medication is for infusion into a vein. It is given in a hospital or clinic setting. Talk to your care team about the use of this medication in children. While this medication may be prescribed for selected conditions, precautions do apply. Overdosage: If you think you have taken too much of this medicine contact a poison control center or emergency room at once. NOTE: This medicine is only for you. Do not share this medicine with others. What if I miss a dose? This does not apply. What may interact with this medication? Certain medications for anxiety or sleep Certain medications for seizures, such phenobarbital Digoxin Medications that relax muscles for surgery Narcotic medications for pain This list may not describe all possible interactions. Give your health care provider a list of all the medicines, herbs, non-prescription drugs, or dietary supplements you use. Also tell them if you smoke, drink alcohol, or use illegal drugs. Some items may interact with your medicine. What should I watch for while using this  medication? Your condition will be monitored carefully while you are receiving this medication. You may need blood work done while you are receiving this medication. What side effects may I notice from receiving this medication? Side effects that you should report to your care team as soon as possible: Allergic reactions--skin rash, itching, hives, swelling of the face, lips, tongue, or throat High magnesium level--confusion, drowsiness, facial flushing, redness, sweating, muscle weakness, fast or irregular heartbeat, trouble breathing Low blood pressure--dizziness, feeling faint or lightheaded, blurry vision Side effects that usually do not require medical attention (report to your care team if they continue or are bothersome): Headache Nausea This list may not describe all possible side effects. Call your doctor for medical advice about side effects. You may report side effects to FDA at 1-800-FDA-1088. Where should I keep my medication? This medication is given in a hospital or clinic and will not be stored at home. NOTE: This sheet is a summary. It may not cover all possible information. If you have questions about this medicine, talk to your doctor, pharmacist, or health care provider.  2024 [employer] Standard (2021-06-15 00:00:00)  

## 2023-08-06 NOTE — Progress Notes (Signed)
Hematology/Oncology Progress note [phone] [phone]      CHIEF COMPLAINTS/REASON FOR VISIT:  Follow up for rectal cancer  ASSESSMENT & PLAN:   Cancer Staging  Rectal cancer [facility]) Staging form: Colon and Rectum, AJCC 8th Edition - Pathologic stage from 10/06/2019: Stage IIC (ypT4b, pN0, cM0) - Signed by [name], MD on 10/06/2019 - Clinical stage from 06/30/2020: Jacqueline Yoder - Signed by [name], MD on 04/17/2022 - Pathologic: Stage Unknown (rpTX, pNX, cM1) - Signed by [name], MD on 01/31/2021   Rectal cancer [facility]) History of stage IIIC Rectal cancer, s/p TNT, followed by 09/17/19 APR/posterior vaginectomy/TAH/BSO/VY-flap, pT4b pN0 with close vaginal margin 0.2 mm.  Uterus and ovaries negative for malignancy. palliative radiation to vaginal recurrence- 01/19/21 recurrence with lung metastasis.-Palliative -FOLFIRI plus bevacizumab.  Irinotecan was dropped in November 2022 due to side effects. Negative for UGT1A1*28 - radiographically stable, rise of CEA-July 2023 CT lung metastasis worse--> Dec 2023 PET showed progression in pelvic lymph nodes and bone lesions,--> 2nd line irinotecan +panitumumab-->CT March 2024 stable. --> June 2024 CT showed mild lung progression/Progressive left iliac bone metastasis, vulvar mass difficult for biopsy in office,  Tempus Liquid biopsy - no actionable variants --> 06/25/2023 CT showed progression of lung nodules [SLD increase 23mm] and iliac inguinal lymphadenopathy--> 07/23/23 switch to dose reduced FOLFOX   Labs are reviewed and discussed with patient, CEA nadir was in 490, currently gradually increasing Hold dose reduced FOLFOX today due to ingrown toenail s/p debridement with focal erythema/swelling Recommend patient to continue and finish her antibiotics.  Anemia due to antineoplastic chemotherapy Hb stable, observation  Vaginal spotting likely recurrent rectal cancer involving vagina. Discussed with GynOnc She has another  episode of spottin, recommend palliative radiation.  Vulvar mass location is difficult for biopsy in the clinic per gynonc.  Dr. Rushie Chestnut will not offer radiation, patient would like to see Gynonc if the vulvar mass can be biopsied in OR  History of pulmonary embolism History of PE and bilateral lower extremity DVT  05/18/23 Repeat right LE US showed chronic non occlusive DVT Continue  Eliquis 2.5 mg twice daily for anticoagulation prophylaxis. Recommend leg elevation and compression stocking.   Hypomagnesemia If magnesium is 1.0 - 1.2:  administrate 6 gm IV magnesium sulfate If magnesium is 1.2-1.4, administrate 4 gm IV magnesium sulfate If magnesium is 1.5-1.6:  administrate 2 gm IV magnesium sulfate.  Continue slow mag orally 64mg  twice daily.     Follow up 2 weeks.  All questions were answered. The patient knows to call the clinic with any problems, questions or concerns.  [name], MD, PhD [facility] Hematology Oncology 08/06/2023      HISTORY OF PRESENTING ILLNESS:  Oncology History  Rectal cancer (HCC)  01/23/2019 Initial Diagnosis   Rectal cancer [facility]) Patient initially presented with complaints of postmenopausal bleeding on 08/16/2018.  History of was menopausal vaginal bleeding in 2016 which resulted in cervical polypectomy.  Pathology 02/04/2015 showed cervical polyp, consistent with benign endometrial polyp.  [name] after polypectomy due to anxiety associated with pelvic exams.  pelvic exam on 08/16/2018 reviewed cervical abnormality and from enlarged uterus. Seen by Dr. [name] on 10/29/2018.  Endometrial biopsy and a Pap smear was performed. 10/29/2018 Pap smear showed adenocarcinoma, favor endometrial origin. 10/29/2018 endometrial biopsy showed endometrioid carcinoma, FIGO grade 1.  10/29/2018- TA & TV Ultrasound revealed: Anteverted uterus measuring 8.7 x 5.6 x 6.4 cm without evidence of focal masses.  The endometrium measuring 24.1 mm (thickened)  and  heterogeneous.  Right and left ovaries not visualized.  No adnexal masses identified.  No free fluid in cul-de-sac.  Patient was seen by Dr. [name] in clinic on 11/13/2018.  Cervical exam reveals 2 cm exophytic irregular mass consistent with malignancy.    11/19/2018 CT chest abdomen pelvis with contrast showed thickened endometrium with some irregularity compatible with the provided diagnosis of endometrial malignancy.  There is a mildly prominent left inguinal node 1.4 cm.  Patient was seen by Dr. [name] on 11/20/2018 and left groin lymph node biopsy was recommended.  11/26/2018 patient underwent left inguinal lymph node biopsy. Pathology showed metastatic adenocarcinoma consistent with colorectal origin.  CDX 2+.  Case was discussed on tumor board.  Recommend colonoscopy for further evaluation.  Patient reports significant weight loss 30 pounds over the last year.  Chronic vaginal spotting. Change of bowel habits the past few months.  More constipated.  Family history positive for brother who has colon cancer prostate cancer.  patient has underwent colonoscopy on 12/03/2018 which reviewed a nonobstructing large mass in the rectum.  Also chronic fistula.  Mass was not circumferential.  This was biopsied with a cold forceps for histology.  Pathology came back hyperplastic polyp negative for dysplasia and malignancy. [name], patient underwent flex sigmoidoscopy on 12/06/2018 with rebiopsy of the rectal mass. This time biopsy results came back positive for invasive colorectal adenocarcinoma, moderately differentiated. Immunotherapy for nearly mismatch repair protein (MMR ) was performed.  There is no loss of MMR expression.  low probability of MSI high.   # Seen by [name] for evaluation of resectability for rectal cancer. In addition, she also had a second opinion with [facility] pathology where her endometrial biopsy pathology was changed to  adenocarcinoma,  consistent with colorectal primary.   [name]. She has home health that has been assisting with ostomy care  Patient was also evaluated by [facility] oncology.  Recommendation is to proceed with TNT with concurrent chemoradiation followed by neoadjuvant chemotherapy followed by surgical resection. Patient prefers to have treatment done locally with [facility].    02/04/2019 - 02/04/2019 Chemotherapy   The patient had capecitabine (XELODA) 150 MG tablet, 150 mg (100 % of original dose 150 mg), Oral, 2 times daily after meals, 1 of 1 cycle, Start date: 01/27/2019, End date: 04/03/2019 Dose modification: 150 mg (original dose 150 mg, Cycle 1) capecitabine (XELODA) 500 MG tablet, 1,500 mg (100 % of original dose 1,500 mg), Oral, 2 times daily after meals, 1 of 1 cycle, Start date: 01/27/2019, End date: 04/03/2019 Dose modification: 1,500 mg (original dose 1,500 mg, Cycle 1)  for chemotherapy treatment.    04/09/2019 - 07/18/2019 Chemotherapy   The patient had palonosetron (ALOXI) injection 0.25 mg, 0.25 mg, Intravenous,  Once, 8 of 8 cycles Administration: 0.25 mg (04/09/2019), 0.25 mg (04/23/2019), 0.25 mg (05/07/2019), 0.25 mg (05/21/2019), 0.25 mg (06/04/2019), 0.25 mg (06/18/2019), 0.25 mg (07/02/2019), 0.25 mg (07/16/2019) leucovorin 850 mg in dextrose 5 % 250 mL infusion, 844 mg, Intravenous,  Once, 8 of 8 cycles Administration: 850 mg (04/09/2019), 850 mg (04/23/2019), 850 mg (05/07/2019), 850 mg (05/21/2019), 800 mg (06/18/2019), 800 mg (07/02/2019), 800 mg (07/16/2019) oxaliplatin (ELOXATIN) 150 mg in dextrose 5 % 500 mL chemo infusion, 71 mg/m2 = 160 mg (100 % of original dose 75 mg/m2), Intravenous,  Once, 8 of 8 cycles Dose modification: 75 mg/m2 (original dose 75 mg/m2, Cycle 1, Reason: Provider Judgment) Administration: 150 mg (04/09/2019), 150  mg (04/23/2019), 150 mg (05/07/2019), 150 mg (05/21/2019), 150 mg (06/04/2019), 150 mg (06/18/2019), 150 mg (07/02/2019), 150 mg (07/16/2019) fluorouracil (ADRUCIL)  5,000 mg in sodium chloride 0.9 % 150 mL chemo infusion, 2,370 mg/m2 = 5,050 mg, Intravenous, 1 Day/Dose, 8 of 8 cycles Administration: 5,000 mg (04/09/2019), 5,000 mg (04/23/2019), 5,000 mg (05/07/2019), 5,000 mg (05/21/2019), 5,000 mg (06/04/2019), 5,000 mg (06/18/2019), 5,000 mg (07/02/2019), 5,000 mg (07/16/2019)  for chemotherapy treatment.    10/06/2019 Cancer Staging   Staging form: Colon and Rectum, AJCC 8th Edition - Pathologic stage from 10/06/2019: Stage IIC (ypT4b, pN0, cM0) - Signed by [name], MD on 10/06/2019   06/30/2020 Cancer Staging   Staging form: Colon and Rectum, AJCC 8th Edition - Clinical stage from 06/30/2020: Jacqueline Yoder - Signed by [name], MD on 04/17/2022 Stage prefix: Recurrence Total positive nodes: 1   07/16/2020 - 07/19/2020 Chemotherapy         01/31/2021 Cancer Staging   Staging form: Colon and Rectum, AJCC 8th Edition - Pathologic: Stage Unknown (rpTX, pNX, cM1) - Signed by [name], MD on 01/31/2021 Stage prefix: Recurrence   07/14/2022 Imaging   Bone scan  1. No evidence of skeletal metastatic disease in the hips. 2. Asymmetric increased activity in the RIGHT humeral head is favored degenerative as described above. If pain in the RIGHT shoulder consider further evaluation with plain film or MRI   07/17/2022 Imaging   1. Stable scattered small bilateral pulmonary nodules, largest 8 by 6 mm in the right middle lobe. 2. Subtle chronically stable posterior cortical irregularity and heterogeneity in the left iliac bone, not appreciable on the bone scan of 07/14/2022. This has been present at least over the past year and could reflect radiation therapy related findings although strictly speaking, malignant involvement of the left iliac bone is difficult to exclude. If clinically warranted this could be further assessed with nuclear medicine PET-CT or MRI of the bony pelvis with and without contrast. 3. Pelvic floor laxity with loops of small bowel extending 3 cm  below the pubococcygeal line. 4. Aortic and systemic atherosclerosis as detailed above. Aortic Atherosclerosis    12/26/2022 Imaging   CT chest abdomen pelvis w contrast 1. Stable scattered solid pulmonary nodules. 2. Right inguinal and right pelvic lymph nodes which were hypermetabolic on prior PET-CT are stable. 3. Osseous lesion of the proximal right humerus demonstrates increased sclerosis when compared with the prior PET-CT, possibly due to treatment response. 4. 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 5. Aortic Atherosclerosis     06/25/2023 Imaging   CT chest abdomen pelvis w contrast showed 1. Increased size of the solid bilateral pulmonary nodules,consistent with worsening metastatic disease. 2. Increased size of the right external iliac and inguinal lymph nodes, concerning for worsening metastatic disease. 3. No significant interval change in the left iliac bone osseous metastasis. 4. Enlarged main pulmonary artery measuring 3.6 cm, which can be seen in the setting of pulmonary arterial hypertension. 5.  Aortic Atherosclerosis (ICD10-I70.0)     Miscellaneous   Tempus liquid biopsy showed no actionable variants.  NOTCH1 missense variant, BRCA2 missense variant , MYC missense variant   07/11/2023 Imaging   PET showed 1. Multifocal tracer avid abdominopelvic lymph nodes are identified compatible with metastatic disease. 2. Multifocal bilateral pulmonary nodules are identified compatible with pulmonary metastasis.  3. Diffuse increased radiotracer uptake throughout the marrow space is identified diminishing sensitivity for detecting osseous metastasis. No abnormal increased radiotracer uptake identified above background increased bone  marrow activity to indicate metabolically active bone metastases. 4.  Aortic Atherosclerosis   Metastatic adenocarcinoma [facility])       # Oncology Treatment:  02/03/2019- 03/19/2019  concurrent Xeloda and radiation.  Xeloda dose 825mg  /m2  BID - rounded to 1650mg  BID- on days of radiation. 04/09/2019, started on FOLFOX with bolus early.  Omitted.  07/16/2019 finished 8 cycles of FOLFOX. 09/17/19 APR/posterior vaginectomy/TAH/BSO/VY-flap pT4b pN0 with close vaginal margin 0.2 mm.  Uterus and ovaries negative for malignancy. Patient reports bilateral lower extremity numbness and tingling, intermittent, left worse than right. She has lost a lot of weight since her APR surgery.   #Family history with half brother having's history of colon cancer prostate cancer.  Personal history of colorectal cancer.  Patient has not decided if she wants genetic testing.    # history of PE( 01/13/2020)  in the bilateral lower extremity DVT (01/13/2020).   She finishes 6 months of anticoagulation with Eliquis 5 mg twice daily. Now switched to Eliquis 2.5 mg twice daily..  # She has now developed recurrent disease. #06/30/20  vaginal introitus mass biopsied. Pathology is consistent with metastatic colorectal adenocarcinoma I have discussed with [name]  Dr. Luciano Cutter and the mass is not resectable. Patient has also had colonoscopy by Dr. Tobi Bastos yesterday. Normal examination. # 07/16/2020 cycle 1 FOLFIRI  # 07/20/2020 PET scan was done for further evaluation, images are consistent with local recurrence, no distant metastasis. #Discussed with radiation oncology Dr. Rushie Chestnut will recommends concurrent chemotherapy and radiation. 08/02/2020-08/16/2020, patient starts radiation.  Xeloda was held due to neutropenia 08/17/2020,-09/06/2020 Xeloda 1500 mg twice daily concurrently with radiation  01/31/21 started on FOLFIRI + Bev 05/18/2021 CT chest abdomen pelvis showed Previously noted enlargement of bilateral inguinal lymph nodes is resolved, consistent with treatment response of nodal metastatic disease. Interval decrease in size of multiple small bilateral pulmonary nodules, consistent with treatment response of pulmonary metastatic disease. No evidence of new  metastatic disease. 05/24/2021 - 08/30/2021, continued on FOLFIRI plus bevacizumab.  Irinotecan dose was reduced, eventually 100mg /m2  09/02/2021, CT chest abdomen pelvis without contrast Showed small bilateral pulmonary nodules, unchanged.  Stable metastatic disease.  No noncontrast evidence of new metastatic disease in the chest abdomen pelvis.  Small parastomal hernia.  Enlargement of main pulmonary artery.  Coronary artery disease.  09/13/2021, maintenance 5-FU/bevacizumab 11/28/2021, 5-FU/Irinotecan/bevacizumab.  Irinotecan 100 mg/m2 was added back due to progressively increasing CEA.  -right lower extremity edema, US showed non occlusive DVT, edema has improved.   INTERVAL HISTORY Jacqueline Yoder is a 77 y.o. female who has above history reviewed by me presents for follow-up of rectal cancer. On Irinotecan and panitumumab. she tolerates well.   She was accompanied by her daughter. - chronic diarrhea is manageable. Was seen by Dr. Rushie Chestnut, no plan of palliative RT currently.  She take Lomotil PRN, usually 1-2 time per day.  + ingrown toe nail, s/p debridement of symptomatic nail border. She was recommended to take Doxycycline for 10 days, she took one dose 2 days ago and did not take any additional dosage.  + vaginal spotting.   Review of Systems  Constitutional:  Positive for fatigue. Negative for appetite change, chills, fever and unexpected weight change.  HENT:   Negative for hearing loss and voice change.   Eyes:  Negative for eye problems.  Respiratory:  Negative for chest tightness and cough.   Cardiovascular:  Negative for chest pain.  Gastrointestinal:  Positive for diarrhea. Negative for abdominal distention, abdominal pain, blood  in stool, constipation and nausea.  Endocrine: Negative for hot flashes.  Genitourinary:  Positive for vaginal bleeding. Negative for difficulty urinating and frequency.   Musculoskeletal:  Positive for arthralgias.  Skin:  Negative for itching.   Neurological:  Negative for extremity weakness and numbness.  Hematological:  Negative for adenopathy.  Psychiatric/Behavioral:  Negative for confusion.     MEDICAL HISTORY:  Past Medical History:  Diagnosis Date   Allergy    Arthritis    Blood clot in vein    Family history of colon cancer    GERD (gastroesophageal reflux disease)    Hypercholesteremia    Hypertension    Hypertension    Lower extremity edema    Personal history of chemotherapy    Rectal cancer (HCC) 12/2018   Urinary incontinence     SURGICAL HISTORY: Past Surgical History:  Procedure Laterality Date   ABDOMINAL HYSTERECTOMY     CHOLECYSTECTOMY  1971   COLONOSCOPY WITH PROPOFOL N/A 12/03/2018   Procedure: COLONOSCOPY WITH PROPOFOL;  Surgeon: Midge Minium, MD;  Location: [facility] ENDOSCOPY;  Service: Endoscopy;  Laterality: N/A;   COLONOSCOPY WITH PROPOFOL N/A 07/15/2020   Procedure: COLONOSCOPY WITH PROPOFOL;  Surgeon: Wyline Mood, MD;  Location: [facility] ENDOSCOPY;  Service: Gastroenterology;  Laterality: N/A;   FLEXIBLE SIGMOIDOSCOPY N/A 12/06/2018   Procedure: FLEXIBLE SIGMOIDOSCOPY;  Surgeon: Wyline Mood, MD;  Location: [facility] ENDOSCOPY;  Service: Endoscopy;  Laterality: N/A;   LAPAROSCOPIC COLOSTOMY  01/06/2019   PORTACATH PLACEMENT N/A 04/03/2019   Procedure: INSERTION PORT-A-CATH;  Surgeon: Leafy Ro, MD;  Location: [facility] ORS;  Service: General;  Laterality: N/A;    SOCIAL HISTORY: Social History   Socioeconomic History   Marital status: Widowed    Spouse name: Not on file   Number of children: Not on file   Years of education: Not on file   Highest education level: Some college, no degree  Occupational History   Not on file  Tobacco Use   Smoking status: Former    Current packs/day: 0.00    Types: Cigarettes    Quit date: 12/02/1977    Years since quitting: 45.7   Smokeless tobacco: Former  Building services engineer status: Never Used  Substance and Sexual Activity   Alcohol use: Never   Drug  use: Never   Sexual activity: Not Currently    Birth control/protection: None  Other Topics Concern   Not on file  Social History Narrative   Lives with daughter   Social Determinants of Health   Financial Resource Strain: Low Risk  (07/09/2023)   Overall Financial Resource Strain (CARDIA)    Difficulty of Paying Living Expenses: Not very hard  Food Insecurity: No Food Insecurity (07/09/2023)   Hunger Vital Sign    Worried About Running Out of Food in the Last Year: Never true    Ran Out of Food in the Last Year: Never true  Transportation Needs: No Transportation Needs (07/09/2023)   PRAPARE - Administrator, Civil Service (Medical): No    Lack of Transportation (Non-Medical): No  Physical Activity: Insufficiently Active (07/09/2023)   Exercise Vital Sign    Days of Exercise per Week: 2 days    Minutes of Exercise per Session: 10 min  Stress: No Stress Concern Present (07/09/2023)   [employer] of Occupational Health - Occupational Stress Questionnaire    Feeling of Stress : Not at all  Social Connections: Moderately Integrated (07/09/2023)   Social Connection and Isolation Panel [NHANES]  Frequency of Communication with Friends and Family: More than three times a week    Frequency of Social Gatherings with Friends and Family: More than three times a week    Attends Religious Services: More than 4 times per year    Active Member of [employer] or Organizations: Yes    Attends Banker Meetings: More than 4 times per year    Marital Status: Widowed  Recent Concern: Social Connections - Moderately Isolated (07/06/2023)   Social Connection and Isolation Panel [NHANES]    Frequency of Communication with Friends and Family: More than three times a week    Frequency of Social Gatherings with Friends and Family: More than three times a week    Attends Religious Services: More than 4 times per year    Active Member of [employer] or Organizations: No    Attends Tax inspector Meetings: Never    Marital Status: Widowed  Intimate Partner Violence: Not At Risk (07/06/2023)   Humiliation, Afraid, Rape, and Kick questionnaire    Fear of Current or Ex-Partner: No    Emotionally Abused: No    Physically Abused: No    Sexually Abused: No    FAMILY HISTORY: Family History  Problem Relation Age of Onset   Colon cancer Brother 31       exposure to chemicals [place]   Hypertension Mother    Stroke Mother    Kidney failure Father    Breast cancer Neg Hx    Ovarian cancer Neg Hx     ALLERGIES:  is allergic to sulfamethoxazole-trimethoprim.  MEDICATIONS:  Current Outpatient Medications  Medication Sig Dispense Refill   Cholecalciferol (VITAMIN D3) 2000 units capsule Take 2,000 Units by mouth daily.     clindamycin (CLINDAGEL) 1 % gel Apply topically 2 (two) times daily. 30 g 5   diclofenac sodium (VOLTAREN) 1 % GEL Apply 2 g topically 4 (four) times daily as needed (joint pain).  11   diphenoxylate-atropine (LOMOTIL) 2.5-0.025 MG tablet Take 1 tablet by mouth 4 (four) times daily as needed for diarrhea or loose stools. 120 tablet 0   doxycycline (VIBRAMYCIN) 100 MG capsule Take 1 capsule (100 mg total) by mouth 2 (two) times daily for 10 days. 20 capsule 0   ELIQUIS 2.5 MG TABS tablet TAKE 1 TABLET BY MOUTH TWICE  DAILY 200 tablet 2   fluticasone (FLONASE) 50 MCG/ACT nasal spray USE 1 SPRAY IN EACH NOSTRIL ONCE DAILY 16 g 3   gentamicin cream (GARAMYCIN) 0.1 % Apply to affected toe once daily. 30 g 1   ipratropium (ATROVENT) 0.03 % nasal spray USE 1 SPRAY IN BOTH NOSTRILS  TWICE DAILY 60 mL 2   ketoconazole (NIZORAL) 2 % cream Apply 1 Application topically daily. For up to 2 weeks. May repeat if need. 30 g 2   lidocaine-prilocaine (EMLA) cream Apply 1 application. topically as needed. 30 g 6   loperamide (IMODIUM) 2 MG capsule Take 1 capsule (2 mg total) by mouth See admin instructions. With onset of loose stool, take 4mg  followed by 2mg  every 2 hours,   Maximum: 16 mg/day 120 capsule 1   loratadine (CLARITIN) 10 MG tablet Take 10 mg by mouth daily.     magnesium chloride (SLOW-MAG) 64 MG TBEC SR tablet Take 1 tablet (64 mg total) by mouth 2 (two) times daily. 60 tablet 2   Multiple Vitamins-Minerals (ONE-A-DAY WOMENS 50 PLUS PO) Take 1 tablet by mouth daily.      nystatin (MYCOSTATIN/NYSTOP) powder  APPLY 1 POWDER TOPICALLY THREE TIMES DAILY 45 g 0   potassium chloride SA (KLOR-CON M) 20 MEQ tablet TAKE 1 TABLET BY MOUTH DAILY 100 tablet 2   simvastatin (ZOCOR) 40 MG tablet Take 1 tablet (40 mg total) by mouth at bedtime. 100 tablet 3   triamcinolone cream (KENALOG) 0.5 % Apply 1 Application topically 2 (two) times daily. To affected areas, for up to 2 weeks. 30 g 2   triamterene-hydrochlorothiazide (DYAZIDE) 37.5-25 MG capsule Take 1 each (1 capsule total) by mouth daily. 100 capsule 3   zinc gluconate 50 MG tablet Take 50 mg by mouth daily.     No current facility-administered medications for this visit.   Facility-Administered Medications Ordered in Other Visits  Medication Dose Route Frequency Provider Last Rate Last Admin   0.9 %  sodium chloride infusion   Intravenous Continuous [name], MD   Stopped at 08/06/23 1216   heparin lock flush 100 UNIT/ML injection              PHYSICAL EXAMINATION: ECOG PERFORMANCE STATUS: 1 - Symptomatic but completely ambulatory  Physical Exam Constitutional:      General: She is not in acute distress. HENT:     Head: Normocephalic and atraumatic.  Eyes:     General: No scleral icterus. Cardiovascular:     Rate and Rhythm: Normal rate.     Heart sounds: Normal heart sounds.  Pulmonary:     Effort: Pulmonary effort is normal. No respiratory distress.     Breath sounds: No wheezing.  Abdominal:     General: Bowel sounds are normal. There is no distension.     Palpations: Abdomen is soft.     Comments: + Colostomy bag   Musculoskeletal:        General: No deformity. Normal range of motion.      Cervical back: Normal range of motion and neck supple.  Skin:    General: Skin is warm and dry.  Neurological:     Mental Status: She is alert and oriented to person, place, and time. Mental status is at baseline.     Cranial Nerves: No cranial nerve deficit.     Coordination: Coordination normal.       LABORATORY DATA:  I have reviewed the data as listed    Latest Ref Rng & Units 08/06/2023    8:40 AM 07/23/2023    8:46 AM 07/03/2023    8:44 AM  CBC  WBC 4.0 - 10.5 K/uL 4.1  5.0  6.2   Hemoglobin 12.0 - 15.0 g/dL 16.1  09.6  04.5   Hematocrit 36.0 - 46.0 % 34.3  34.9  36.4   Platelets 150 - 400 K/uL 262  216  222       Latest Ref Rng & Units 08/06/2023    8:36 AM 07/23/2023    8:47 AM 07/03/2023    8:44 AM  CMP  Glucose 70 - 99 mg/dL 409  811  914   BUN 8 - 23 mg/dL 13  15  13    Creatinine 0.44 - 1.00 mg/dL 7.82  9.56  2.13   Sodium 135 - 145 mmol/L 138  137  137   Potassium 3.5 - 5.1 mmol/L 3.5  3.6  3.5   Chloride 98 - 111 mmol/L 101  102  105   CO2 22 - 32 mmol/L 27  28  27    Calcium 8.9 - 10.3 mg/dL 8.6  8.7  8.7   Total Protein 6.5 -  8.1 g/dL 6.5  6.5  6.5   Total Bilirubin 0.3 - 1.2 mg/dL 0.4  0.5  0.5   Alkaline Phos 38 - 126 U/L 60  71  85   AST 15 - 41 U/L 41  38  36   ALT 0 - 44 U/L 26  27  25       RADIOGRAPHIC STUDIES: I have personally reviewed the radiological images as listed and agreed with the findings in the report. NM PET Image Restag (PS) Skull Base To Thigh  Result Date: 07/11/2023 CLINICAL DATA:  Subsequent treatment strategy for colorectal carcinoma. EXAM: NUCLEAR MEDICINE PET SKULL BASE TO THIGH TECHNIQUE: 8.66 mCi F-18 FDG was injected intravenously. Full-ring PET imaging was performed from the skull base to thigh after the radiotracer. CT data was obtained and used for attenuation correction and anatomic localization. Fasting blood glucose: 64 mg/dl COMPARISON:  CT chest, abdomen and pelvis 06/25/2023 and PET-CT from 09/19/2022 FINDINGS:  Mediastinal blood pool activity: SUV max 2.37 Liver activity: SUV max NA NECK: No hypermetabolic lymph nodes in the neck. Incidental CT findings: None. CHEST: Left supraclavicular lymph node measures 0.6 cm within SUV max of 4.85, image 32/6. This is new when compared with the previous PET-CT. On 06/25/2023 this measured the same. Multifocal bilateral pulmonary nodules are identified compatible with pulmonary metastasis. -Index nodule within the right middle lobe measures 1.4 cm with SUV max of 4.30 unchanged in size from 06/25/2023. On the PET-CT from 09/19/2022 this measured 1.2 cm and had an SUV max of 0.74. -Additional scattered lung nodules are identified throughout both lungs most of which are too small to characterize by PET-CT. The largest of these is in the anteromedial right middle lobe measuring 8 mm with SUV max of 1.22, image 37/2. Stable from 06/25/2023. On the PET-CT from 09/19/2022 this measured 8 mm with SUV max of 1.17. Incidental CT findings: No pleural effusion or consolidative change. Aortic atherosclerotic calcifications. ABDOMEN/PELVIS: No tracer avid liver metastases. There is no abnormal tracer uptake identified within the liver, pancreas, or spleen. Multifocal tracer avid abdominopelvic lymph nodes are identified. Tracer avid left periaortic lymph node measures 8 mm within SUV max of 8.36. This is new compared with the previous exam. Right common iliac lymph node measures 1 cm and has an SUV max of 6.6, image 107/6. This is also new compared with the prior exam. Right external iliac lymph node measures 7 mm within SUV max of 6.06. This is similar to the recent CT from 06/25/2023. On the previous PET-CT from 09/19/22 this measured 7 mm with SUV max of 7.7. More distally, there is a new tracer avid right external iliac lymph node which measures 1.4 cm within SUV max of 6.63. Right inguinal lymph node measures 1.1 cm within SUV max of 5.16. Formally this measured 0.9 cm with SUV max of 5.13.  No tracer avid peritoneal nodule Incidental CT findings: Left lower quadrant descending colostomy. Aortic atherosclerosis. SKELETON: Diffuse increased radiotracer uptake throughout the marrow space is identified diminishing sensitivity for detecting osseous metastasis. No abnormal increased radiotracer uptake identified above background increased bone marrow activity to indicate metabolically active bone metastases. Incidental CT findings: None. IMPRESSION: 1. Multifocal tracer avid abdominopelvic lymph nodes are identified compatible with metastatic disease. Although similar when compared with 06/25/2023, there are new tracer avid abdominopelvic lymph nodes identified when compared with the previous PET-CT from 09/19/2022. 2. Multifocal bilateral pulmonary nodules are identified compatible with pulmonary metastasis. The largest of these is in the right  middle lobe measuring 1.4 cm with SUV max of 4.30. This is unchanged in size from 06/25/2023. On the PET-CT from 09/19/2022 this measured 1.2 cm and had an SUV max of 0.74. The remaining lung nodules are too small to characterize by PET-CT. 3. Diffuse increased radiotracer uptake throughout the marrow space is identified diminishing sensitivity for detecting osseous metastasis. No abnormal increased radiotracer uptake identified above background increased bone marrow activity to indicate metabolically active bone metastases. 4.  Aortic Atherosclerosis (ICD10-I70.0). Electronically Signed   By: Signa Kell M.D.   On: 07/11/2023 12:08   CT CHEST ABDOMEN PELVIS W CONTRAST  Result Date: 06/25/2023 CLINICAL DATA:  Follow-up metastatic rectal cancer. * Tracking Code: BO * EXAM: CT CHEST, ABDOMEN, AND PELVIS WITH CONTRAST TECHNIQUE: Multidetector CT imaging of the chest, abdomen and pelvis was performed following the standard protocol during bolus administration of intravenous contrast. RADIATION DOSE REDUCTION: This exam was performed according to the departmental  dose-optimization program which includes automated exposure control, adjustment of the mA and/or kV according to patient size and/or use of iterative reconstruction technique. CONTRAST:  85mL OMNIPAQUE IOHEXOL 300 MG/ML  SOLN COMPARISON:  Multiple priors including most recent CT March 20, 2023. FINDINGS: CT CHEST FINDINGS Cardiovascular: Right chest Port-A-Cath with tip in the right atrium. Aortic atherosclerosis. Enlarged main pulmonary artery measuring 3.6 cm. Coronary artery calcifications normal size heart. No significant pericardial effusion/thickening. Mediastinum/Nodes: No suspicious thyroid nodule. No pathologically enlarged mediastinal, hilar or axillary lymph nodes esophagus is grossly unremarkable Lungs/Pleura: Increased size of the solid bilateral pulmonary nodules. For reference: -medial right middle lobe pulmonary nodule measures 17 x 13 mm on image 102/3 previously 14 x 11 mm. No pleural effusion. No pneumothorax. Scattered atelectasis/scarring. Musculoskeletal: Thoracic spondylosis. CT ABDOMEN PELVIS FINDINGS Hepatobiliary: Stable 8 mm subcapsular lesion in the anterior inferior right hepatic lobe segment V on image 71/2 common non FDG avid on prior PET-CT stable over multiple prior examinations favored benign. No discrete suspicious hepatic lesion identified. Gallbladder surgically absent. Similar mild dilation of the biliary tree with the common duct measuring 10 mm but smoothly tapering to the level of the ampulla and favored reservoir effect post cholecystectomy Pancreas: No pancreatic ductal dilation or evidence of acute inflammation. Spleen: No splenomegaly.  Stable 11 mm splenic cyst on image 61/2 Adrenals/Urinary Tract: Bilateral adrenal glands appear normal. No hydronephrosis stable benign bilateral renal cysts, including a 2.1 cm lesion in the lateral interpolar left kidney which measures Hounsfield units slightly greater than that of simple fluid but was previously classifies as a cyst on  MRI March 16, 2020, also stable are the hypodense renal lesions technically too small to accurately characterize but statistically likely to reflect cysts. These are considered benign and requiring no independent imaging follow-up. Urinary bladder is unremarkable for degree of distension Stomach/Bowel: Radiopaque enteric contrast material traverses the ileocecal valve. Stomach is unremarkable for degree of distension. No pathologic dilation of small or large bowel. Normal appendix. Colonic diverticulosis without findings of acute diverticulitis Prior abdominoperineal resection with mid transverse end colostomy in the left anterior abdominal wall. Fat containing peristomal hernia. Vascular/Lymphatic: Aortic atherosclerosis. Smooth IVC contours. The portal, splenic and superior mesenteric veins are patent. Previously indexed right external iliac lymph node measures 11 mm in short axis on image 100/2 previously 6 mm. Previously indexed inguinal lymph node measures 14 mm in short axis on image 107/2 previously 11 mm. Reproductive: Status post hysterectomy and bilateral salpingo-oophorectomy. Other: No significant abdominopelvic free fluid. Fat containing peristomal hernia Musculoskeletal:  No significant interval change in the left iliac bone osseous metastasis on image 83/2. Multilevel degenerative changes spine. Probable postradiation change in the sacrum IMPRESSION: 1. Increased size of the solid bilateral pulmonary nodules, consistent with worsening metastatic disease. 2. Increased size of the right external iliac and inguinal lymph nodes, concerning for worsening metastatic disease. 3. No significant interval change in the left iliac bone osseous metastasis. 4. Enlarged main pulmonary artery measuring 3.6 cm, which can be seen in the setting of pulmonary arterial hypertension. 5.  Aortic Atherosclerosis (ICD10-I70.0). Electronically Signed   By: Maudry Mayhew M.D.   On: 06/25/2023 11:35   US Venous Img Lower  Unilateral Right  Result Date: 05/18/2023 CLINICAL DATA:  Right lower extremity swelling EXAM: RIGHT LOWER EXTREMITY VENOUS DOPPLER ULTRASOUND TECHNIQUE: Gray-scale sonography with compression, as well as color and duplex ultrasound, were performed to evaluate the deep venous system(s) from the level of the common femoral vein through the popliteal and proximal calf veins. COMPARISON:  01/13/2020 FINDINGS: VENOUS Small amount of nonocclusive thrombus noted in the right posterior tibial vein. The thrombus is echogenic and eccentric which suggests that it is chronic. Otherwise normal compressibility of the common femoral, superficial femoral, and popliteal veins, as well as the visualized calf veins. Visualized portions of profunda femoral vein and great saphenous vein unremarkable. No additional filling defects to suggest DVT on grayscale or color Doppler imaging. Doppler waveforms show normal direction of venous flow, normal respiratory plasticity and response to augmentation. Limited views of the contralateral common femoral vein are unremarkable. OTHER 3.9 x 1.1 x 1.2 cm fluid collection in the right popliteal fossa likely Baker's cyst. Limitations: none IMPRESSION: Small amount of chronic nonocclusive DVT noted in the right posterior tibial vein. Electronically Signed   By: Acquanetta Belling M.D.   On: 05/18/2023 14:36      NM PET Image Restag (PS) Skull Base To Thigh  Result Date: 07/11/2023 CLINICAL DATA:  Subsequent treatment strategy for colorectal carcinoma. EXAM: NUCLEAR MEDICINE PET SKULL BASE TO THIGH TECHNIQUE: 8.66 mCi F-18 FDG was injected intravenously. Full-ring PET imaging was performed from the skull base to thigh after the radiotracer. CT data was obtained and used for attenuation correction and anatomic localization. Fasting blood glucose: 64 mg/dl COMPARISON:  CT chest, abdomen and pelvis 06/25/2023 and PET-CT from 09/19/2022 FINDINGS: Mediastinal blood pool activity: SUV max 2.37 Liver  activity: SUV max NA NECK: No hypermetabolic lymph nodes in the neck. Incidental CT findings: None. CHEST: Left supraclavicular lymph node measures 0.6 cm within SUV max of 4.85, image 32/6. This is new when compared with the previous PET-CT. On 06/25/2023 this measured the same. Multifocal bilateral pulmonary nodules are identified compatible with pulmonary metastasis. -Index nodule within the right middle lobe measures 1.4 cm with SUV max of 4.30 unchanged in size from 06/25/2023. On the PET-CT from 09/19/2022 this measured 1.2 cm and had an SUV max of 0.74. -Additional scattered lung nodules are identified throughout both lungs most of which are too small to characterize by PET-CT. The largest of these is in the anteromedial right middle lobe measuring 8 mm with SUV max of 1.22, image 37/2. Stable from 06/25/2023. On the PET-CT from 09/19/2022 this measured 8 mm with SUV max of 1.17. Incidental CT findings: No pleural effusion or consolidative change. Aortic atherosclerotic calcifications. ABDOMEN/PELVIS: No tracer avid liver metastases. There is no abnormal tracer uptake identified within the liver, pancreas, or spleen. Multifocal tracer avid abdominopelvic lymph nodes are identified. Tracer avid left periaortic  lymph node measures 8 mm within SUV max of 8.36. This is new compared with the previous exam. Right common iliac lymph node measures 1 cm and has an SUV max of 6.6, image 107/6. This is also new compared with the prior exam. Right external iliac lymph node measures 7 mm within SUV max of 6.06. This is similar to the recent CT from 06/25/2023. On the previous PET-CT from 09/19/22 this measured 7 mm with SUV max of 7.7. More distally, there is a new tracer avid right external iliac lymph node which measures 1.4 cm within SUV max of 6.63. Right inguinal lymph node measures 1.1 cm within SUV max of 5.16. Formally this measured 0.9 cm with SUV max of 5.13. No tracer avid peritoneal nodule Incidental CT  findings: Left lower quadrant descending colostomy. Aortic atherosclerosis. SKELETON: Diffuse increased radiotracer uptake throughout the marrow space is identified diminishing sensitivity for detecting osseous metastasis. No abnormal increased radiotracer uptake identified above background increased bone marrow activity to indicate metabolically active bone metastases. Incidental CT findings: None. IMPRESSION: 1. Multifocal tracer avid abdominopelvic lymph nodes are identified compatible with metastatic disease. Although similar when compared with 06/25/2023, there are new tracer avid abdominopelvic lymph nodes identified when compared with the previous PET-CT from 09/19/2022. 2. Multifocal bilateral pulmonary nodules are identified compatible with pulmonary metastasis. The largest of these is in the right middle lobe measuring 1.4 cm with SUV max of 4.30. This is unchanged in size from 06/25/2023. On the PET-CT from 09/19/2022 this measured 1.2 cm and had an SUV max of 0.74. The remaining lung nodules are too small to characterize by PET-CT. 3. Diffuse increased radiotracer uptake throughout the marrow space is identified diminishing sensitivity for detecting osseous metastasis. No abnormal increased radiotracer uptake identified above background increased bone marrow activity to indicate metabolically active bone metastases. 4.  Aortic Atherosclerosis (ICD10-I70.0). Electronically Signed   By: Signa Kell M.D.   On: 07/11/2023 12:08   CT CHEST ABDOMEN PELVIS W CONTRAST  Result Date: 06/25/2023 CLINICAL DATA:  Follow-up metastatic rectal cancer. * Tracking Code: BO * EXAM: CT CHEST, ABDOMEN, AND PELVIS WITH CONTRAST TECHNIQUE: Multidetector CT imaging of the chest, abdomen and pelvis was performed following the standard protocol during bolus administration of intravenous contrast. RADIATION DOSE REDUCTION: This exam was performed according to the departmental dose-optimization program which includes  automated exposure control, adjustment of the mA and/or kV according to patient size and/or use of iterative reconstruction technique. CONTRAST:  85mL OMNIPAQUE IOHEXOL 300 MG/ML  SOLN COMPARISON:  Multiple priors including most recent CT March 20, 2023. FINDINGS: CT CHEST FINDINGS Cardiovascular: Right chest Port-A-Cath with tip in the right atrium. Aortic atherosclerosis. Enlarged main pulmonary artery measuring 3.6 cm. Coronary artery calcifications normal size heart. No significant pericardial effusion/thickening. Mediastinum/Nodes: No suspicious thyroid nodule. No pathologically enlarged mediastinal, hilar or axillary lymph nodes esophagus is grossly unremarkable Lungs/Pleura: Increased size of the solid bilateral pulmonary nodules. For reference: -medial right middle lobe pulmonary nodule measures 17 x 13 mm on image 102/3 previously 14 x 11 mm. No pleural effusion. No pneumothorax. Scattered atelectasis/scarring. Musculoskeletal: Thoracic spondylosis. CT ABDOMEN PELVIS FINDINGS Hepatobiliary: Stable 8 mm subcapsular lesion in the anterior inferior right hepatic lobe segment V on image 71/2 common non FDG avid on prior PET-CT stable over multiple prior examinations favored benign. No discrete suspicious hepatic lesion identified. Gallbladder surgically absent. Similar mild dilation of the biliary tree with the common duct measuring 10 mm but smoothly tapering to the level of  the ampulla and favored reservoir effect post cholecystectomy Pancreas: No pancreatic ductal dilation or evidence of acute inflammation. Spleen: No splenomegaly.  Stable 11 mm splenic cyst on image 61/2 Adrenals/Urinary Tract: Bilateral adrenal glands appear normal. No hydronephrosis stable benign bilateral renal cysts, including a 2.1 cm lesion in the lateral interpolar left kidney which measures Hounsfield units slightly greater than that of simple fluid but was previously classifies as a cyst on MRI March 16, 2020, also stable are the  hypodense renal lesions technically too small to accurately characterize but statistically likely to reflect cysts. These are considered benign and requiring no independent imaging follow-up. Urinary bladder is unremarkable for degree of distension Stomach/Bowel: Radiopaque enteric contrast material traverses the ileocecal valve. Stomach is unremarkable for degree of distension. No pathologic dilation of small or large bowel. Normal appendix. Colonic diverticulosis without findings of acute diverticulitis Prior abdominoperineal resection with mid transverse end colostomy in the left anterior abdominal wall. Fat containing peristomal hernia. Vascular/Lymphatic: Aortic atherosclerosis. Smooth IVC contours. The portal, splenic and superior mesenteric veins are patent. Previously indexed right external iliac lymph node measures 11 mm in short axis on image 100/2 previously 6 mm. Previously indexed inguinal lymph node measures 14 mm in short axis on image 107/2 previously 11 mm. Reproductive: Status post hysterectomy and bilateral salpingo-oophorectomy. Other: No significant abdominopelvic free fluid. Fat containing peristomal hernia Musculoskeletal: No significant interval change in the left iliac bone osseous metastasis on image 83/2. Multilevel degenerative changes spine. Probable postradiation change in the sacrum IMPRESSION: 1. Increased size of the solid bilateral pulmonary nodules, consistent with worsening metastatic disease. 2. Increased size of the right external iliac and inguinal lymph nodes, concerning for worsening metastatic disease. 3. No significant interval change in the left iliac bone osseous metastasis. 4. Enlarged main pulmonary artery measuring 3.6 cm, which can be seen in the setting of pulmonary arterial hypertension. 5.  Aortic Atherosclerosis (ICD10-I70.0). Electronically Signed   By: Maudry Mayhew M.D.   On: 06/25/2023 11:35   US Venous Img Lower Unilateral Right  Result Date:  05/18/2023 CLINICAL DATA:  Right lower extremity swelling EXAM: RIGHT LOWER EXTREMITY VENOUS DOPPLER ULTRASOUND TECHNIQUE: Gray-scale sonography with compression, as well as color and duplex ultrasound, were performed to evaluate the deep venous system(s) from the level of the common femoral vein through the popliteal and proximal calf veins. COMPARISON:  01/13/2020 FINDINGS: VENOUS Small amount of nonocclusive thrombus noted in the right posterior tibial vein. The thrombus is echogenic and eccentric which suggests that it is chronic. Otherwise normal compressibility of the common femoral, superficial femoral, and popliteal veins, as well as the visualized calf veins. Visualized portions of profunda femoral vein and great saphenous vein unremarkable. No additional filling defects to suggest DVT on grayscale or color Doppler imaging. Doppler waveforms show normal direction of venous flow, normal respiratory plasticity and response to augmentation. Limited views of the contralateral common femoral vein are unremarkable. OTHER 3.9 x 1.1 x 1.2 cm fluid collection in the right popliteal fossa likely Baker's cyst. Limitations: none IMPRESSION: Small amount of chronic nonocclusive DVT noted in the right posterior tibial vein. Electronically Signed   By: Acquanetta Belling M.D.   On: 05/18/2023 14:36

## 2023-08-06 NOTE — Assessment & Plan Note (Signed)
Hb stable, observation

## 2023-08-06 NOTE — Assessment & Plan Note (Addendum)
History of stage IIIC Rectal cancer, s/p TNT, followed by 09/17/19 APR/posterior vaginectomy/TAH/BSO/VY-flap, pT4b pN0 with close vaginal margin 0.2 mm.  Uterus and ovaries negative for malignancy. palliative radiation to vaginal recurrence- 01/19/21 recurrence with lung metastasis.-Palliative -FOLFIRI plus bevacizumab.  Irinotecan was dropped in November 2022 due to side effects. Negative for UGT1A1*28 - radiographically stable, rise of CEA-July 2023 CT lung metastasis worse--> Dec 2023 PET showed progression in pelvic lymph nodes and bone lesions,--> 2nd line irinotecan +panitumumab-->CT March 2024 stable. --> June 2024 CT showed mild lung progression/Progressive left iliac bone metastasis, vulvar mass difficult for biopsy in office,  Tempus Liquid biopsy - no actionable variants --> 06/25/2023 CT showed progression of lung nodules [SLD increase 39mm] and iliac inguinal lymphadenopathy--> 07/23/23 switch to dose reduced FOLFOX   Labs are reviewed and discussed with patient, CEA nadir was in 490, currently gradually increasing Hold dose reduced FOLFOX today due to ingrown toenail s/p debridement with focal erythema/swelling Recommend patient to continue and finish her antibiotics.

## 2023-08-06 NOTE — Assessment & Plan Note (Signed)
If magnesium is 1.0 - 1.2:  administrate 6 gm IV magnesium sulfate If magnesium is 1.2-1.4, administrate 4 gm IV magnesium sulfate If magnesium is 1.5-1.6:  administrate 2 gm IV magnesium sulfate.  Continue slow mag orally 64mg twice daily.  

## 2023-08-06 NOTE — Assessment & Plan Note (Signed)
History of PE and bilateral lower extremity DVT  05/18/23 Repeat right LE US showed chronic non occlusive DVT Continue  Eliquis 2.5 mg twice daily for anticoagulation prophylaxis. Recommend leg elevation and compression stocking.

## 2023-08-06 NOTE — Assessment & Plan Note (Signed)
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 Discussed with GynOnc She has another episode of spottin, recommend palliative radiation.  Vulvar mass location is difficult for biopsy in the clinic per gynonc.  Dr. Rushie Chestnut will not offer radiation, patient would like to see Gynonc if the vulvar mass can be biopsied in OR

## 2023-08-06 NOTE — Progress Notes (Signed)
Patient went to get her nails clipped on Friday at [facility]. They had concerns about her Right big toe, so They gave her an antibiotics. They may have to remove the toenail. Patient only took medication 1 day (Saturday) They had concerns about this medication and patient being able to get treatment.

## 2023-08-07 LAB — CEA: CEA: 1939 ng/mL — ABNORMAL HIGH (ref 0.0–4.7)

## 2023-08-08 ENCOUNTER — Inpatient Hospital Stay: Payer: 59

## 2023-08-08 DIAGNOSIS — I10 Essential (primary) hypertension: Secondary | ICD-10-CM | POA: Diagnosis not present

## 2023-08-08 DIAGNOSIS — Z79899 Other long term (current) drug therapy: Secondary | ICD-10-CM | POA: Diagnosis not present

## 2023-08-08 DIAGNOSIS — B372 Candidiasis of skin and nail: Secondary | ICD-10-CM | POA: Diagnosis not present

## 2023-08-08 DIAGNOSIS — Z87891 Personal history of nicotine dependence: Secondary | ICD-10-CM | POA: Diagnosis not present

## 2023-08-08 DIAGNOSIS — Z7901 Long term (current) use of anticoagulants: Secondary | ICD-10-CM | POA: Diagnosis not present

## 2023-08-08 DIAGNOSIS — L89152 Pressure ulcer of sacral region, stage 2: Secondary | ICD-10-CM | POA: Diagnosis not present

## 2023-08-08 DIAGNOSIS — Z556 Problems related to health literacy: Secondary | ICD-10-CM | POA: Diagnosis not present

## 2023-08-09 ENCOUNTER — Encounter: Payer: Self-pay | Admitting: Podiatry

## 2023-08-09 ENCOUNTER — Ambulatory Visit (INDEPENDENT_AMBULATORY_CARE_PROVIDER_SITE_OTHER): Payer: 59 | Admitting: Podiatry

## 2023-08-09 VITALS — BP 146/66 | HR 92

## 2023-08-09 DIAGNOSIS — L600 Ingrowing nail: Secondary | ICD-10-CM

## 2023-08-09 DIAGNOSIS — Z933 Colostomy status: Secondary | ICD-10-CM | POA: Diagnosis not present

## 2023-08-09 NOTE — Progress Notes (Signed)
Subjective:  Patient ID: Jacqueline Yoder, female    DOB: 05/14/1946,  MRN: [id number]  Chief Complaint  Patient presents with   Nail Problem    "Dr. Eloy End wanted her to be seen to determine if she needs to have that toenail removed."    77 y.o. female presents with the above complaint.  Patient presents with right hallux lateral border ingrown painful to touch is progressive gotten worse worse with ambulation hurts with pressure she would like to have removed she has not seen MRIs prior to seeing me.  She denies any other acute complaints pain scale 7 out of 10 dull aching nature   Review of Systems: Negative except as noted in the HPI. Denies N/V/F/Ch.  Past Medical History:  Diagnosis Date   Allergy    Arthritis    Blood clot in vein    Family history of colon cancer    GERD (gastroesophageal reflux disease)    Hypercholesteremia    Hypertension    Hypertension    Lower extremity edema    Personal history of chemotherapy    Rectal cancer (HCC) 12/2018   Urinary incontinence     Current Outpatient Medications:    Cholecalciferol (VITAMIN D3) 2000 units capsule, Take 2,000 Units by mouth daily., Disp: , Rfl:    clindamycin (CLINDAGEL) 1 % gel, Apply topically 2 (two) times daily., Disp: 30 g, Rfl: 5   diclofenac sodium (VOLTAREN) 1 % GEL, Apply 2 g topically 4 (four) times daily as needed (joint pain)., Disp: , Rfl: 11   diphenoxylate-atropine (LOMOTIL) 2.5-0.025 MG tablet, Take 1 tablet by mouth 4 (four) times daily as needed for diarrhea or loose stools., Disp: 120 tablet, Rfl: 0   ELIQUIS 2.5 MG TABS tablet, TAKE 1 TABLET BY MOUTH TWICE  DAILY, Disp: 200 tablet, Rfl: 2   fluticasone (FLONASE) 50 MCG/ACT nasal spray, USE 1 SPRAY IN EACH NOSTRIL ONCE DAILY, Disp: 16 g, Rfl: 3   gentamicin cream (GARAMYCIN) 0.1 %, Apply to affected toe once daily., Disp: 30 g, Rfl: 1   ipratropium (ATROVENT) 0.03 % nasal spray, USE 1 SPRAY IN BOTH NOSTRILS  TWICE DAILY, Disp: 60 mL, Rfl: 2    ketoconazole (NIZORAL) 2 % cream, Apply 1 Application topically daily. For up to 2 weeks. May repeat if need., Disp: 30 g, Rfl: 2   lidocaine-prilocaine (EMLA) cream, Apply 1 application. topically as needed., Disp: 30 g, Rfl: 6   loperamide (IMODIUM) 2 MG capsule, Take 1 capsule (2 mg total) by mouth See admin instructions. With onset of loose stool, take 4mg  followed by 2mg  every 2 hours,  Maximum: 16 mg/day, Disp: 120 capsule, Rfl: 1   loratadine (CLARITIN) 10 MG tablet, Take 10 mg by mouth daily., Disp: , Rfl:    magnesium chloride (SLOW-MAG) 64 MG TBEC SR tablet, Take 1 tablet (64 mg total) by mouth 2 (two) times daily., Disp: 60 tablet, Rfl: 2   Multiple Vitamins-Minerals (ONE-A-DAY WOMENS 50 PLUS PO), Take 1 tablet by mouth daily. , Disp: , Rfl:    nystatin (MYCOSTATIN/NYSTOP) powder, APPLY 1 POWDER TOPICALLY THREE TIMES DAILY, Disp: 45 g, Rfl: 0   potassium chloride SA (KLOR-CON M) 20 MEQ tablet, TAKE 1 TABLET BY MOUTH DAILY, Disp: 100 tablet, Rfl: 2   simvastatin (ZOCOR) 40 MG tablet, Take 1 tablet (40 mg total) by mouth at bedtime., Disp: 100 tablet, Rfl: 3   triamcinolone cream (KENALOG) 0.5 %, Apply 1 Application topically 2 (two) times daily. To affected areas, for  up to 2 weeks., Disp: 30 g, Rfl: 2   triamterene-hydrochlorothiazide (DYAZIDE) 37.5-25 MG capsule, Take 1 each (1 capsule total) by mouth daily., Disp: 100 capsule, Rfl: 3   zinc gluconate 50 MG tablet, Take 50 mg by mouth daily., Disp: , Rfl:  No current facility-administered medications for this visit.  Facility-Administered Medications Ordered in Other Visits:    heparin lock flush 100 UNIT/ML injection, , , ,   Social History   Tobacco Use  Smoking Status Former   Current packs/day: 0.00   Types: Cigarettes   Quit date: 12/02/1977   Years since quitting: 45.7  Smokeless Tobacco Former    Allergies  Allergen Reactions   Sulfamethoxazole-Trimethoprim Other (See Comments)    Also said she had chills and pressure  in nose.   Objective:   Vitals:   08/09/23 1114  BP: (!) 146/66  Pulse: 92   There is no height or weight on file to calculate BMI. Constitutional Well developed. Well nourished.  Vascular Dorsalis pedis pulses palpable bilaterally. Posterior tibial pulses palpable bilaterally. Capillary refill normal to all digits.  No cyanosis or clubbing noted. Pedal hair growth normal.  Neurologic Normal speech. Oriented to person, place, and time. Epicritic sensation to light touch grossly present bilaterally.  Dermatologic Painful ingrowing nail at lateral nail borders of the hallux nail right. No other open wounds. No skin lesions.  Orthopedic: Normal joint ROM without pain or crepitus bilaterally. No visible deformities. No bony tenderness.   Radiographs: None Assessment:   1. Ingrown toenail of right foot    Plan:  Patient was evaluated and treated and all questions answered.  Ingrown Nail, right -Patient elects to proceed with minor surgery to remove ingrown toenail removal today. Consent reviewed and signed by patient. -Ingrown nail excised. See procedure note. -Educated on post-procedure care including soaking. Written instructions provided and reviewed. -Patient to follow up in 2 weeks for nail check.  Procedure: Excision of Ingrown Toenail Location: Right thank you1st toe lateral nail borders. Anesthesia: Lidocaine 1% plain; 1.5 mL and Marcaine 0.5% plain; 1.5 mL, digital block. Skin Prep: Betadine. Dressing: Silvadene; telfa; dry, sterile, compression dressing. Technique: Following skin prep, the toe was exsanguinated and a tourniquet was secured at the base of the toe. The affected nail border was freed, split with a nail splitter, and excised. Chemical matrixectomy was then performed with phenol and irrigated out with alcohol. The tourniquet was then removed and sterile dressing applied. Disposition: Patient tolerated procedure well. Patient to return in 2 weeks for  follow-up.   No follow-ups on file.

## 2023-08-10 ENCOUNTER — Encounter: Payer: Self-pay | Admitting: Oncology

## 2023-08-13 ENCOUNTER — Other Ambulatory Visit: Payer: Self-pay | Admitting: Oncology

## 2023-08-13 ENCOUNTER — Encounter: Payer: Self-pay | Admitting: Oncology

## 2023-08-14 ENCOUNTER — Inpatient Hospital Stay: Payer: 59

## 2023-08-14 ENCOUNTER — Encounter: Payer: Self-pay | Admitting: Oncology

## 2023-08-14 ENCOUNTER — Telehealth: Payer: Self-pay

## 2023-08-14 ENCOUNTER — Inpatient Hospital Stay (HOSPITAL_BASED_OUTPATIENT_CLINIC_OR_DEPARTMENT_OTHER): Payer: 59 | Admitting: Oncology

## 2023-08-14 VITALS — BP 133/59 | HR 88 | Temp 97.9°F | Resp 18

## 2023-08-14 VITALS — BP 134/63 | HR 96 | Temp 97.8°F | Ht 64.0 in | Wt 161.5 lb

## 2023-08-14 DIAGNOSIS — K521 Toxic gastroenteritis and colitis: Secondary | ICD-10-CM

## 2023-08-14 DIAGNOSIS — C7951 Secondary malignant neoplasm of bone: Secondary | ICD-10-CM | POA: Diagnosis not present

## 2023-08-14 DIAGNOSIS — C799 Secondary malignant neoplasm of unspecified site: Secondary | ICD-10-CM

## 2023-08-14 DIAGNOSIS — R590 Localized enlarged lymph nodes: Secondary | ICD-10-CM | POA: Diagnosis not present

## 2023-08-14 DIAGNOSIS — N939 Abnormal uterine and vaginal bleeding, unspecified: Secondary | ICD-10-CM

## 2023-08-14 DIAGNOSIS — I129 Hypertensive chronic kidney disease with stage 1 through stage 4 chronic kidney disease, or unspecified chronic kidney disease: Secondary | ICD-10-CM | POA: Diagnosis not present

## 2023-08-14 DIAGNOSIS — N182 Chronic kidney disease, stage 2 (mild): Secondary | ICD-10-CM | POA: Diagnosis not present

## 2023-08-14 DIAGNOSIS — Z86711 Personal history of pulmonary embolism: Secondary | ICD-10-CM | POA: Diagnosis not present

## 2023-08-14 DIAGNOSIS — E7800 Pure hypercholesterolemia, unspecified: Secondary | ICD-10-CM | POA: Diagnosis not present

## 2023-08-14 DIAGNOSIS — T451X5A Adverse effect of antineoplastic and immunosuppressive drugs, initial encounter: Secondary | ICD-10-CM

## 2023-08-14 DIAGNOSIS — I2510 Atherosclerotic heart disease of native coronary artery without angina pectoris: Secondary | ICD-10-CM | POA: Diagnosis not present

## 2023-08-14 DIAGNOSIS — Z5111 Encounter for antineoplastic chemotherapy: Secondary | ICD-10-CM | POA: Diagnosis not present

## 2023-08-14 DIAGNOSIS — Z79899 Other long term (current) drug therapy: Secondary | ICD-10-CM | POA: Diagnosis not present

## 2023-08-14 DIAGNOSIS — C20 Malignant neoplasm of rectum: Secondary | ICD-10-CM | POA: Diagnosis not present

## 2023-08-14 DIAGNOSIS — L600 Ingrowing nail: Secondary | ICD-10-CM | POA: Diagnosis not present

## 2023-08-14 DIAGNOSIS — E876 Hypokalemia: Secondary | ICD-10-CM | POA: Diagnosis not present

## 2023-08-14 DIAGNOSIS — Z7901 Long term (current) use of anticoagulants: Secondary | ICD-10-CM | POA: Diagnosis not present

## 2023-08-14 DIAGNOSIS — Z87891 Personal history of nicotine dependence: Secondary | ICD-10-CM | POA: Diagnosis not present

## 2023-08-14 DIAGNOSIS — K219 Gastro-esophageal reflux disease without esophagitis: Secondary | ICD-10-CM | POA: Diagnosis not present

## 2023-08-14 DIAGNOSIS — R600 Localized edema: Secondary | ICD-10-CM | POA: Diagnosis not present

## 2023-08-14 DIAGNOSIS — I700 Atherosclerosis of aorta: Secondary | ICD-10-CM | POA: Diagnosis not present

## 2023-08-14 DIAGNOSIS — D6481 Anemia due to antineoplastic chemotherapy: Secondary | ICD-10-CM

## 2023-08-14 DIAGNOSIS — I82501 Chronic embolism and thrombosis of unspecified deep veins of right lower extremity: Secondary | ICD-10-CM | POA: Diagnosis not present

## 2023-08-14 LAB — CMP (CANCER CENTER ONLY)
ALT: 24 U/L (ref 0–44)
AST: 38 U/L (ref 15–41)
Albumin: 3.6 g/dL (ref 3.5–5.0)
Alkaline Phosphatase: 68 U/L (ref 38–126)
Anion gap: 7 (ref 5–15)
BUN: 18 mg/dL (ref 8–23)
CO2: 27 mmol/L (ref 22–32)
Calcium: 8.6 mg/dL — ABNORMAL LOW (ref 8.9–10.3)
Chloride: 104 mmol/L (ref 98–111)
Creatinine: 0.74 mg/dL (ref 0.44–1.00)
GFR, Estimated: >60 mL/min (ref >60)
Glucose, Bld: 104 mg/dL — ABNORMAL HIGH (ref 70–99)
Potassium: 3.6 mmol/L (ref 3.5–5.1)
Sodium: 138 mmol/L (ref 135–145)
Total Bilirubin: 0.6 mg/dL (ref 0.3–1.2)
Total Protein: 6.5 g/dL (ref 6.5–8.1)

## 2023-08-14 LAB — CBC WITH DIFFERENTIAL (CANCER CENTER ONLY)
Abs Immature Granulocytes: 0.01 10*3/uL (ref 0.00–0.07)
Basophils Absolute: 0 10*3/uL (ref 0.0–0.1)
Basophils Relative: 1 %
Eosinophils Absolute: 0.1 10*3/uL (ref 0.0–0.5)
Eosinophils Relative: 2 %
HCT: 36.1 % (ref 36.0–46.0)
Hemoglobin: 11.3 g/dL — ABNORMAL LOW (ref 12.0–15.0)
Immature Granulocytes: 0 %
Lymphocytes Relative: 33 %
Lymphs Abs: 1.2 10*3/uL (ref 0.7–4.0)
MCH: 29.7 pg (ref 26.0–34.0)
MCHC: 31.3 g/dL (ref 30.0–36.0)
MCV: 94.8 fL (ref 80.0–100.0)
Monocytes Absolute: 0.4 10*3/uL (ref 0.1–1.0)
Monocytes Relative: 12 %
Neutro Abs: 2 10*3/uL (ref 1.7–7.7)
Neutrophils Relative %: 52 %
Platelet Count: 257 10*3/uL (ref 150–400)
RBC: 3.81 MIL/uL — ABNORMAL LOW (ref 3.87–5.11)
RDW: 15.3 % (ref 11.5–15.5)
WBC Count: 3.7 10*3/uL — ABNORMAL LOW (ref 4.0–10.5)
nRBC: 0 % (ref 0.0–0.2)

## 2023-08-14 LAB — MAGNESIUM: Magnesium: 1.2 mg/dL — ABNORMAL LOW (ref 1.7–2.4)

## 2023-08-14 MED ORDER — DIPHENHYDRAMINE HCL 25 MG PO CAPS
25.0000 mg | ORAL_CAPSULE | Freq: Once | ORAL | Status: AC
  Administered 2023-08-14: 25 mg via ORAL
  Filled 2023-08-14: qty 1

## 2023-08-14 MED ORDER — DEXTROSE 5 % IV SOLN
Freq: Once | INTRAVENOUS | Status: AC
  Filled 2023-08-14: qty 250

## 2023-08-14 MED ORDER — PALONOSETRON HCL INJECTION 0.25 MG/5ML
0.2500 mg | Freq: Once | INTRAVENOUS | Status: AC
  Administered 2023-08-14: 0.25 mg via INTRAVENOUS
  Filled 2023-08-14: qty 5

## 2023-08-14 MED ORDER — DEXAMETHASONE SODIUM PHOSPHATE 10 MG/ML IJ SOLN
10.0000 mg | Freq: Once | INTRAMUSCULAR | Status: AC
  Administered 2023-08-14: 10 mg via INTRAVENOUS
  Filled 2023-08-14: qty 1

## 2023-08-14 MED ORDER — LEUCOVORIN CALCIUM INJECTION 350 MG
400.0000 mg/m2 | Freq: Once | INTRAVENOUS | Status: AC
  Administered 2023-08-14: 724 mg via INTRAVENOUS
  Filled 2023-08-14: qty 36.2

## 2023-08-14 MED ORDER — ACETAMINOPHEN 325 MG PO TABS
650.0000 mg | ORAL_TABLET | Freq: Once | ORAL | Status: AC
  Administered 2023-08-14: 650 mg via ORAL
  Filled 2023-08-14: qty 2

## 2023-08-14 MED ORDER — SODIUM CHLORIDE 0.9 % IV SOLN
2400.0000 mg/m2 | INTRAVENOUS | Status: DC
  Administered 2023-08-14: 4350 mg via INTRAVENOUS
  Filled 2023-08-14: qty 87

## 2023-08-14 MED ORDER — OXALIPLATIN CHEMO INJECTION 100 MG/20ML
70.0000 mg/m2 | Freq: Once | INTRAVENOUS | Status: AC
  Administered 2023-08-14: 125 mg via INTRAVENOUS
  Filled 2023-08-14: qty 6.5

## 2023-08-14 NOTE — Assessment & Plan Note (Signed)
Continue potassium chloride 20 mEq daily.

## 2023-08-14 NOTE — Assessment & Plan Note (Signed)
History of PE and bilateral lower extremity DVT  05/18/23 Repeat right LE US showed chronic non occlusive DVT Continue  Eliquis 2.5 mg twice daily for anticoagulation prophylaxis. Recommend leg elevation and compression stocking.

## 2023-08-14 NOTE — Patient Instructions (Signed)
[facility]  Discharge Instructions: Thank you for choosing [facility] to provide your oncology and hematology care.  If you have a lab appointment with the [facility], please go directly to the [facility] and check in at the registration area.  Wear comfortable clothing and clothing appropriate for easy access to any Portacath or PICC line.   We strive to give you quality time with your provider. You may need to reschedule your appointment if you arrive late (15 or more minutes).  Arriving late affects you and other patients whose appointments are after yours.  Also, if you miss three or more appointments without notifying the office, you may be dismissed from the clinic at the provider's discretion.      For prescription refill requests, have your pharmacy contact our office and allow 72 hours for refills to be completed.    Today you received the following chemotherapy and/or immunotherapy agents Oxaliplatin, Leucovorin, Adrucil       To help prevent nausea and vomiting after your treatment, we encourage you to take your nausea medication as directed.  BELOW ARE SYMPTOMS THAT SHOULD BE REPORTED IMMEDIATELY: *FEVER GREATER THAN 100.4 F (38 C) OR HIGHER *CHILLS OR SWEATING *NAUSEA AND VOMITING THAT IS NOT CONTROLLED WITH YOUR NAUSEA MEDICATION *UNUSUAL SHORTNESS OF BREATH *UNUSUAL BRUISING OR BLEEDING *URINARY PROBLEMS (pain or burning when urinating, or frequent urination) *BOWEL PROBLEMS (unusual diarrhea, constipation, pain near the anus) TENDERNESS IN MOUTH AND THROAT WITH OR WITHOUT PRESENCE OF ULCERS (sore throat, sores in mouth, or a toothache) UNUSUAL RASH, SWELLING OR PAIN  UNUSUAL VAGINAL DISCHARGE OR ITCHING   Items with * indicate a potential emergency and should be followed up as soon as possible or go to the Emergency Department if any problems should occur.  Please show the CHEMOTHERAPY ALERT CARD or IMMUNOTHERAPY  ALERT CARD at check-in to the Emergency Department and triage nurse.  Should you have questions after your visit or need to cancel or reschedule your appointment, please contact [facility]  [phone] and follow the prompts.  Office hours are 8:00 a.m. to 4:30 p.m. Monday - Friday. Please note that voicemails left after 4:00 p.m. may not be returned until the following business day.  We are closed weekends and major holidays. You have access to a nurse at all times for urgent questions. Please call the main number to the clinic [phone] and follow the prompts.  For any non-urgent questions, you may also contact your provider using MyChart. We now offer e-Visits for anyone 40 and older to request care online for non-urgent symptoms. For details visit [email or web address].   Also download the MyChart app! Go to the app store, search "MyChart", open the app, select [facility], and log in with your MyChart username and password.

## 2023-08-14 NOTE — Telephone Encounter (Signed)
Okay to proceed w/ verbal orders  Saralyn Pilar, DO [facility] 08/14/2023, 10:28 AM

## 2023-08-14 NOTE — Assessment & Plan Note (Signed)
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  Intermittent vaginal spotting.  Vulvar mass location is difficult for biopsy in the clinic per gynonc.  Dr. Rushie Chestnut will not offer radiation, patient would like to see Gynonc if the vulvar mass can be biopsied in OR

## 2023-08-14 NOTE — Assessment & Plan Note (Signed)
Hb stable, observation

## 2023-08-14 NOTE — Telephone Encounter (Signed)
Copied from CRM [id number]. Topic: General - Other >> Aug 13, 2023  4:25 PM Epimenio Foot F wrote: Reason for CRM: Home Health Verbal Orders - Caller/Agency: [facility] - Toni Amend Number: [phone] Requesting OT/PT/Skilled Nursing/Social Work/Speech Therapy: Skilled nursing for wound care  Frequency: 1 week 9

## 2023-08-14 NOTE — Assessment & Plan Note (Signed)
Chemotherapy plan as listed above 

## 2023-08-14 NOTE — Assessment & Plan Note (Signed)
If magnesium is 1.0 - 1.2:  administrate 6 gm IV magnesium sulfate If magnesium is 1.2-1.4, administrate 4 gm IV magnesium sulfate If magnesium is 1.5-1.6:  administrate 2 gm IV magnesium sulfate.  Continue slow mag orally 64mg twice daily.  

## 2023-08-14 NOTE — Assessment & Plan Note (Addendum)
History of stage IIIC Rectal cancer, s/p TNT, followed by 09/17/19 APR/posterior vaginectomy/TAH/BSO/VY-flap, pT4b pN0 with close vaginal margin 0.2 mm.  Uterus and ovaries negative for malignancy. palliative radiation to vaginal recurrence- 01/19/21 recurrence with lung metastasis.-Palliative -FOLFIRI plus bevacizumab.  Irinotecan was dropped in November 2022 due to side effects. Negative for UGT1A1*28 - radiographically stable, rise of CEA-July 2023 CT lung metastasis worse--> Dec 2023 PET showed progression in pelvic lymph nodes and bone lesions,--> 2nd line irinotecan +panitumumab-->CT March 2024 stable. --> June 2024 CT showed mild lung progression/Progressive left iliac bone metastasis, vulvar mass difficult for biopsy in office,  Tempus Liquid biopsy - no actionable variants --> 06/25/2023 CT showed progression of lung nodules [SLD increase 60mm] and iliac inguinal lymphadenopathy--> 07/23/23 switch to dose reduced FOLFOX   Labs are reviewed and discussed with patient, CEA nadir was in 490, currently gradually increasing Proceed with dose reduced FOLFOX  Plan to repeat CT after 4 cycles of FOLFOX

## 2023-08-14 NOTE — Assessment & Plan Note (Signed)
 continue lomotil  QID PRN as instructed.

## 2023-08-14 NOTE — Telephone Encounter (Signed)
I called Elnita Maxwell and Ok'd verbal orders as requested.

## 2023-08-14 NOTE — Progress Notes (Signed)
Hematology/Oncology Progress note [phone] [phone]      CHIEF COMPLAINTS/REASON FOR VISIT:  Follow up for rectal cancer  ASSESSMENT & PLAN:   Cancer Staging  Rectal cancer [facility]) Staging form: Colon and Rectum, AJCC 8th Edition - Pathologic stage from 10/06/2019: Stage IIC (ypT4b, pN0, cM0) - Signed by [name], MD on 10/06/2019 - Clinical stage from 06/30/2020: [name] - Signed by [name], MD on 04/17/2022 - Pathologic: Stage Unknown (rpTX, pNX, cM1) - Signed by [name], MD on 01/31/2021   Encounter for antineoplastic chemotherapy Chemotherapy plan as listed above.   Anemia due to antineoplastic chemotherapy Hb stable, observation  History of pulmonary embolism History of PE and bilateral lower extremity DVT  05/18/23 Repeat right LE US showed chronic non occlusive DVT Continue  Eliquis 2.5 mg twice daily for anticoagulation prophylaxis. Recommend leg elevation and compression stocking.   Hypokalemia Continue potassium chloride 20 mEq daily.    Hypomagnesemia If magnesium is 1.0 - 1.2:  administrate 6 gm IV magnesium sulfate If magnesium is 1.2-1.4, administrate 4 gm IV magnesium sulfate If magnesium is 1.5-1.6:  administrate 2 gm IV magnesium sulfate.  Continue slow mag orally 64mg  twice daily.   Rectal cancer (HCC) History of stage IIIC Rectal cancer, s/p TNT, followed by 09/17/19 APR/posterior vaginectomy/TAH/BSO/VY-flap, pT4b pN0 with close vaginal margin 0.2 mm.  Uterus and ovaries negative for malignancy. palliative radiation to vaginal recurrence- 01/19/21 recurrence with lung metastasis.-Palliative -FOLFIRI plus bevacizumab.  Irinotecan was dropped in November 2022 due to side effects. Negative for UGT1A1*28 - radiographically stable, rise of CEA-July 2023 CT lung metastasis worse--> Dec 2023 PET showed progression in pelvic lymph nodes and bone lesions,--> 2nd line irinotecan +panitumumab-->CT March 2024 stable. --> June 2024 CT showed  mild lung progression/Progressive left iliac bone metastasis, vulvar mass difficult for biopsy in office,  Tempus Liquid biopsy - no actionable variants --> 06/25/2023 CT showed progression of lung nodules [SLD increase 74mm] and iliac inguinal lymphadenopathy--> 07/23/23 switch to dose reduced FOLFOX   Labs are reviewed and discussed with patient, CEA nadir was in 490, currently gradually increasing Proceed with dose reduced FOLFOX  Plan to repeat CT after 4 cycles of FOLFOX  Vaginal spotting likely recurrent rectal cancer involving vagina.  Intermittent vaginal spotting.  Vulvar mass location is difficult for biopsy in the clinic per gynonc.  Dr. Rushie Chestnut will not offer radiation, patient would like to see Gynonc if the vulvar mass can be biopsied in OR  Chemotherapy induced diarrhea continue lomotil  QID PRN as instructed.     Follow up 2 weeks.  All questions were answered. The patient knows to call the clinic with any problems, questions or concerns.  [name], MD, PhD [facility] Hematology Oncology 08/14/2023      HISTORY OF PRESENTING ILLNESS:  Oncology History  Rectal cancer (HCC)  01/23/2019 Initial Diagnosis   Rectal cancer [facility]) Patient initially presented with complaints of postmenopausal bleeding on 08/16/2018.  History of was menopausal vaginal bleeding in 2016 which resulted in cervical polypectomy.  Pathology 02/04/2015 showed cervical polyp, consistent with benign endometrial polyp.  [name] after polypectomy due to anxiety associated with pelvic exams.  pelvic exam on 08/16/2018 reviewed cervical abnormality and from enlarged uterus. Seen by Dr. [name] on 10/29/2018.  Endometrial biopsy and a Pap smear was performed. 10/29/2018 Pap smear showed adenocarcinoma, favor endometrial origin. 10/29/2018 endometrial biopsy showed endometrioid carcinoma, FIGO grade 1.  10/29/2018- TA & TV Ultrasound revealed: Anteverted uterus measuring  8.7 x 5.6 x 6.4 cm without  evidence of focal masses.  The endometrium measuring 24.1 mm (thickened) and heterogeneous.  Right and left ovaries not visualized.  No adnexal masses identified.  No free fluid in cul-de-sac.  Patient was seen by Dr. [name] in clinic on 11/13/2018.  Cervical exam reveals 2 cm exophytic irregular mass consistent with malignancy.    11/19/2018 CT chest abdomen pelvis with contrast showed thickened endometrium with some irregularity compatible with the provided diagnosis of endometrial malignancy.  There is a mildly prominent left inguinal node 1.4 cm.  Patient was seen by Dr. [name] on 11/20/2018 and left groin lymph node biopsy was recommended.  11/26/2018 patient underwent left inguinal lymph node biopsy. Pathology showed metastatic adenocarcinoma consistent with colorectal origin.  CDX 2+.  Case was discussed on tumor board.  Recommend colonoscopy for further evaluation.  Patient reports significant weight loss 30 pounds over the last year.  Chronic vaginal spotting. Change of bowel habits the past few months.  More constipated.  Family history positive for brother who has colon cancer prostate cancer.  patient has underwent colonoscopy on 12/03/2018 which reviewed a nonobstructing large mass in the rectum.  Also chronic fistula.  Mass was not circumferential.  This was biopsied with a cold forceps for histology.  Pathology came back hyperplastic polyp negative for dysplasia and malignancy. [name], patient underwent flex sigmoidoscopy on 12/06/2018 with rebiopsy of the rectal mass. This time biopsy results came back positive for invasive colorectal adenocarcinoma, moderately differentiated. Immunotherapy for nearly mismatch repair protein (MMR ) was performed.  There is no loss of MMR expression.  low probability of MSI high.   # Seen by [name] for evaluation of resectability for rectal cancer. In addition, she also had a second opinion with [facility] pathology  where her endometrial biopsy pathology was changed to  adenocarcinoma, consistent with colorectal primary.   [name]. She has home health that has been assisting with ostomy care  Patient was also evaluated by [facility] oncology.  Recommendation is to proceed with TNT with concurrent chemoradiation followed by neoadjuvant chemotherapy followed by surgical resection. Patient prefers to have treatment done locally with [facility].    02/04/2019 - 02/04/2019 Chemotherapy   The patient had capecitabine (XELODA) 150 MG tablet, 150 mg (100 % of original dose 150 mg), Oral, 2 times daily after meals, 1 of 1 cycle, Start date: 01/27/2019, End date: 04/03/2019 Dose modification: 150 mg (original dose 150 mg, Cycle 1) capecitabine (XELODA) 500 MG tablet, 1,500 mg (100 % of original dose 1,500 mg), Oral, 2 times daily after meals, 1 of 1 cycle, Start date: 01/27/2019, End date: 04/03/2019 Dose modification: 1,500 mg (original dose 1,500 mg, Cycle 1)  for chemotherapy treatment.    04/09/2019 - 07/18/2019 Chemotherapy   The patient had palonosetron (ALOXI) injection 0.25 mg, 0.25 mg, Intravenous,  Once, 8 of 8 cycles Administration: 0.25 mg (04/09/2019), 0.25 mg (04/23/2019), 0.25 mg (05/07/2019), 0.25 mg (05/21/2019), 0.25 mg (06/04/2019), 0.25 mg (06/18/2019), 0.25 mg (07/02/2019), 0.25 mg (07/16/2019) leucovorin 850 mg in dextrose 5 % 250 mL infusion, 844 mg, Intravenous,  Once, 8 of 8 cycles Administration: 850 mg (04/09/2019), 850 mg (04/23/2019), 850 mg (05/07/2019), 850 mg (05/21/2019), 800 mg (06/18/2019), 800 mg (07/02/2019), 800 mg (07/16/2019) oxaliplatin (ELOXATIN) 150 mg in dextrose 5 % 500 mL chemo infusion, 71 mg/m2 = 160 mg (100 % of original dose 75 mg/m2), Intravenous,  Once, 8 of 8 cycles  Dose modification: 75 mg/m2 (original dose 75 mg/m2, Cycle 1, Reason: Provider Judgment) Administration: 150 mg (04/09/2019), 150 mg (04/23/2019), 150 mg (05/07/2019), 150 mg (05/21/2019), 150 mg (06/04/2019), 150 mg  (06/18/2019), 150 mg (07/02/2019), 150 mg (07/16/2019) fluorouracil (ADRUCIL) 5,000 mg in sodium chloride 0.9 % 150 mL chemo infusion, 2,370 mg/m2 = 5,050 mg, Intravenous, 1 Day/Dose, 8 of 8 cycles Administration: 5,000 mg (04/09/2019), 5,000 mg (04/23/2019), 5,000 mg (05/07/2019), 5,000 mg (05/21/2019), 5,000 mg (06/04/2019), 5,000 mg (06/18/2019), 5,000 mg (07/02/2019), 5,000 mg (07/16/2019)  for chemotherapy treatment.    10/06/2019 Cancer Staging   Staging form: Colon and Rectum, AJCC 8th Edition - Pathologic stage from 10/06/2019: Stage IIC (ypT4b, pN0, cM0) - Signed by [name], MD on 10/06/2019   06/30/2020 Cancer Staging   Staging form: Colon and Rectum, AJCC 8th Edition - Clinical stage from 06/30/2020: [name] - Signed by [name], MD on 04/17/2022 Stage prefix: Recurrence Total positive nodes: 1   07/16/2020 - 07/19/2020 Chemotherapy         01/31/2021 Cancer Staging   Staging form: Colon and Rectum, AJCC 8th Edition - Pathologic: Stage Unknown (rpTX, pNX, cM1) - Signed by [name], MD on 01/31/2021 Stage prefix: Recurrence   07/14/2022 Imaging   Bone scan  1. No evidence of skeletal metastatic disease in the hips. 2. Asymmetric increased activity in the RIGHT humeral head is favored degenerative as described above. If pain in the RIGHT shoulder consider further evaluation with plain film or MRI   07/17/2022 Imaging   1. Stable scattered small bilateral pulmonary nodules, largest 8 by 6 mm in the right middle lobe. 2. Subtle chronically stable posterior cortical irregularity and heterogeneity in the left iliac bone, not appreciable on the bone scan of 07/14/2022. This has been present at least over the past year and could reflect radiation therapy related findings although strictly speaking, malignant involvement of the left iliac bone is difficult to exclude. If clinically warranted this could be further assessed with nuclear medicine PET-CT or MRI of the bony pelvis with and without  contrast. 3. Pelvic floor laxity with loops of small bowel extending 3 cm below the pubococcygeal line. 4. Aortic and systemic atherosclerosis as detailed above. Aortic Atherosclerosis    12/26/2022 Imaging   CT chest abdomen pelvis w contrast 1. Stable scattered solid pulmonary nodules. 2. Right inguinal and right pelvic lymph nodes which were hypermetabolic on prior PET-CT are stable. 3. Osseous lesion of the proximal right humerus demonstrates increased sclerosis when compared with the prior PET-CT, possibly due to treatment response. 4. 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 5. Aortic Atherosclerosis     06/25/2023 Imaging   CT chest abdomen pelvis w contrast showed 1. Increased size of the solid bilateral pulmonary nodules,consistent with worsening metastatic disease. 2. Increased size of the right external iliac and inguinal lymph nodes, concerning for worsening metastatic disease. 3. No significant interval change in the left iliac bone osseous metastasis. 4. Enlarged main pulmonary artery measuring 3.6 cm, which can be seen in the setting of pulmonary arterial hypertension. 5.  Aortic Atherosclerosis (ICD10-I70.0)     Miscellaneous   Tempus liquid biopsy showed no actionable variants.  NOTCH1 missense variant, BRCA2 missense variant , MYC missense variant   07/11/2023 Imaging   PET showed 1. Multifocal tracer avid abdominopelvic lymph nodes are identified compatible with metastatic disease. 2. Multifocal bilateral pulmonary nodules are identified compatible with pulmonary metastasis.  3. Diffuse increased radiotracer uptake throughout the marrow space  is identified diminishing sensitivity for detecting osseous metastasis. No abnormal increased radiotracer uptake identified above background increased bone marrow activity to indicate metabolically active bone metastases. 4.  Aortic Atherosclerosis   Metastatic adenocarcinoma [facility])       # Oncology Treatment:   02/03/2019- 03/19/2019  concurrent Xeloda and radiation.  Xeloda dose 825mg  /m2 BID - rounded to 1650mg  BID- on days of radiation. 04/09/2019, started on FOLFOX with bolus early.  Omitted.  07/16/2019 finished 8 cycles of FOLFOX. 09/17/19 APR/posterior vaginectomy/TAH/BSO/VY-flap pT4b pN0 with close vaginal margin 0.2 mm.  Uterus and ovaries negative for malignancy. Patient reports bilateral lower extremity numbness and tingling, intermittent, left worse than right. She has lost a lot of weight since her APR surgery.   #Family history with half brother having's history of colon cancer prostate cancer.  Personal history of colorectal cancer.  Patient has not decided if she wants genetic testing.    # history of PE( 01/13/2020)  in the bilateral lower extremity DVT (01/13/2020).   She finishes 6 months of anticoagulation with Eliquis 5 mg twice daily. Now switched to Eliquis 2.5 mg twice daily..  # She has now developed recurrent disease. #06/30/20  vaginal introitus mass biopsied. Pathology is consistent with metastatic colorectal adenocarcinoma I have discussed with [name]  Dr. Luciano Cutter and the mass is not resectable. Patient has also had colonoscopy by Dr. Tobi Bastos yesterday. Normal examination. # 07/16/2020 cycle 1 FOLFIRI  # 07/20/2020 PET scan was done for further evaluation, images are consistent with local recurrence, no distant metastasis. #Discussed with radiation oncology Dr. Rushie Chestnut will recommends concurrent chemotherapy and radiation. 08/02/2020-08/16/2020, patient starts radiation.  Xeloda was held due to neutropenia 08/17/2020,-09/06/2020 Xeloda 1500 mg twice daily concurrently with radiation  01/31/21 started on FOLFIRI + Bev 05/18/2021 CT chest abdomen pelvis showed Previously noted enlargement of bilateral inguinal lymph nodes is resolved, consistent with treatment response of nodal metastatic disease. Interval decrease in size of multiple small bilateral pulmonary nodules, consistent  with treatment response of pulmonary metastatic disease. No evidence of new metastatic disease. 05/24/2021 - 08/30/2021, continued on FOLFIRI plus bevacizumab.  Irinotecan dose was reduced, eventually 100mg /m2  09/02/2021, CT chest abdomen pelvis without contrast Showed small bilateral pulmonary nodules, unchanged.  Stable metastatic disease.  No noncontrast evidence of new metastatic disease in the chest abdomen pelvis.  Small parastomal hernia.  Enlargement of main pulmonary artery.  Coronary artery disease.  09/13/2021, maintenance 5-FU/bevacizumab 11/28/2021, 5-FU/Irinotecan/bevacizumab.  Irinotecan 100 mg/m2 was added back due to progressively increasing CEA.  -right lower extremity edema, US showed non occlusive DVT, edema has improved.   INTERVAL HISTORY MONICKA ASLESON is a 77 y.o. female who has above history reviewed by me presents for follow-up of rectal cancer. On Irinotecan and panitumumab. she tolerates well.   She was accompanied by her daughter. - chronic diarrhea is manageable. Was seen by Dr. Rushie Chestnut, no plan of palliative RT currently.  She take Lomotil PRN, usually 1-2 time per day.  + ingrown toe nail, s/p debridement of symptomatic nail border. Swelling and erythematous changes are better, she finished Doxycycline  + vaginal spotting sometimes  Review of Systems  Constitutional:  Positive for fatigue. Negative for appetite change, chills, fever and unexpected weight change.  HENT:   Negative for hearing loss and voice change.   Eyes:  Negative for eye problems.  Respiratory:  Negative for chest tightness and cough.   Cardiovascular:  Negative for chest pain.  Gastrointestinal:  Positive for diarrhea. Negative for abdominal  distention, abdominal pain, blood in stool, constipation and nausea.  Endocrine: Negative for hot flashes.  Genitourinary:  Positive for vaginal bleeding. Negative for difficulty urinating and frequency.   Musculoskeletal:  Positive for arthralgias.   Skin:  Negative for itching.  Neurological:  Negative for extremity weakness and numbness.  Hematological:  Negative for adenopathy.  Psychiatric/Behavioral:  Negative for confusion.     MEDICAL HISTORY:  Past Medical History:  Diagnosis Date   Allergy    Arthritis    Blood clot in vein    Family history of colon cancer    GERD (gastroesophageal reflux disease)    Hypercholesteremia    Hypertension    Hypertension    Lower extremity edema    Personal history of chemotherapy    Rectal cancer (HCC) 12/2018   Urinary incontinence     SURGICAL HISTORY: Past Surgical History:  Procedure Laterality Date   ABDOMINAL HYSTERECTOMY     CHOLECYSTECTOMY  1971   COLONOSCOPY WITH PROPOFOL N/A 12/03/2018   Procedure: COLONOSCOPY WITH PROPOFOL;  Surgeon: Midge Minium, MD;  Location: [facility] ENDOSCOPY;  Service: Endoscopy;  Laterality: N/A;   COLONOSCOPY WITH PROPOFOL N/A 07/15/2020   Procedure: COLONOSCOPY WITH PROPOFOL;  Surgeon: Wyline Mood, MD;  Location: [facility] ENDOSCOPY;  Service: Gastroenterology;  Laterality: N/A;   FLEXIBLE SIGMOIDOSCOPY N/A 12/06/2018   Procedure: FLEXIBLE SIGMOIDOSCOPY;  Surgeon: Wyline Mood, MD;  Location: [facility] ENDOSCOPY;  Service: Endoscopy;  Laterality: N/A;   LAPAROSCOPIC COLOSTOMY  01/06/2019   PORTACATH PLACEMENT N/A 04/03/2019   Procedure: INSERTION PORT-A-CATH;  Surgeon: Leafy Ro, MD;  Location: [facility] ORS;  Service: General;  Laterality: N/A;    SOCIAL HISTORY: Social History   Socioeconomic History   Marital status: Widowed    Spouse name: Not on file   Number of children: Not on file   Years of education: Not on file   Highest education level: Some college, no degree  Occupational History   Not on file  Tobacco Use   Smoking status: Former    Current packs/day: 0.00    Types: Cigarettes    Quit date: 12/02/1977    Years since quitting: 45.7   Smokeless tobacco: Former  Building services engineer status: Never Used  Substance and Sexual Activity    Alcohol use: Never   Drug use: Never   Sexual activity: Not Currently    Birth control/protection: None  Other Topics Concern   Not on file  Social History Narrative   Lives with daughter   Social Determinants of Health   Financial Resource Strain: Low Risk  (07/09/2023)   Overall Financial Resource Strain (CARDIA)    Difficulty of Paying Living Expenses: Not very hard  Food Insecurity: No Food Insecurity (07/09/2023)   Hunger Vital Sign    Worried About Running Out of Food in the Last Year: Never true    Ran Out of Food in the Last Year: Never true  Transportation Needs: No Transportation Needs (07/09/2023)   PRAPARE - Administrator, Civil Service (Medical): No    Lack of Transportation (Non-Medical): No  Physical Activity: Insufficiently Active (07/09/2023)   Exercise Vital Sign    Days of Exercise per Week: 2 days    Minutes of Exercise per Session: 10 min  Stress: No Stress Concern Present (07/09/2023)   [employer] of Occupational Health - Occupational Stress Questionnaire    Feeling of Stress : Not at all  Social Connections: Moderately Integrated (07/09/2023)   Social Connection and  Isolation Panel [NHANES]    Frequency of Communication with Friends and Family: More than three times a week    Frequency of Social Gatherings with Friends and Family: More than three times a week    Attends Religious Services: More than 4 times per year    Active Member of [employer] or Organizations: Yes    Attends Banker Meetings: More than 4 times per year    Marital Status: Widowed  Recent Concern: Social Connections - Moderately Isolated (07/06/2023)   Social Connection and Isolation Panel [NHANES]    Frequency of Communication with Friends and Family: More than three times a week    Frequency of Social Gatherings with Friends and Family: More than three times a week    Attends Religious Services: More than 4 times per year    Active Member of [employer] or  Organizations: No    Attends Banker Meetings: Never    Marital Status: Widowed  Intimate Partner Violence: Not At Risk (07/06/2023)   Humiliation, Afraid, Rape, and Kick questionnaire    Fear of Current or Ex-Partner: No    Emotionally Abused: No    Physically Abused: No    Sexually Abused: No    FAMILY HISTORY: Family History  Problem Relation Age of Onset   Colon cancer Brother 72       exposure to chemicals [place]   Hypertension Mother    Stroke Mother    Kidney failure Father    Breast cancer Neg Hx    Ovarian cancer Neg Hx     ALLERGIES:  is allergic to sulfamethoxazole-trimethoprim.  MEDICATIONS:  Current Outpatient Medications  Medication Sig Dispense Refill   Cholecalciferol (VITAMIN D3) 2000 units capsule Take 2,000 Units by mouth daily.     clindamycin (CLINDAGEL) 1 % gel Apply topically 2 (two) times daily. 30 g 5   diclofenac sodium (VOLTAREN) 1 % GEL Apply 2 g topically 4 (four) times daily as needed (joint pain).  11   diphenoxylate-atropine (LOMOTIL) 2.5-0.025 MG tablet Take 1 tablet by mouth 4 (four) times daily as needed for diarrhea or loose stools. 120 tablet 0   ELIQUIS 2.5 MG TABS tablet TAKE 1 TABLET BY MOUTH TWICE  DAILY 200 tablet 2   fluticasone (FLONASE) 50 MCG/ACT nasal spray USE 1 SPRAY IN EACH NOSTRIL ONCE DAILY 16 g 3   gentamicin cream (GARAMYCIN) 0.1 % Apply to affected toe once daily. 30 g 1   ipratropium (ATROVENT) 0.03 % nasal spray USE 1 SPRAY IN BOTH NOSTRILS  TWICE DAILY 60 mL 2   ketoconazole (NIZORAL) 2 % cream Apply 1 Application topically daily. For up to 2 weeks. May repeat if need. 30 g 2   lidocaine-prilocaine (EMLA) cream Apply 1 application. topically as needed. 30 g 6   loperamide (IMODIUM) 2 MG capsule Take 1 capsule (2 mg total) by mouth See admin instructions. With onset of loose stool, take 4mg  followed by 2mg  every 2 hours,  Maximum: 16 mg/day 120 capsule 1   loratadine (CLARITIN) 10 MG tablet Take 10 mg by  mouth daily.     magnesium chloride (SLOW-MAG) 64 MG TBEC SR tablet Take 1 tablet (64 mg total) by mouth 2 (two) times daily. 60 tablet 2   Multiple Vitamins-Minerals (ONE-A-DAY WOMENS 50 PLUS PO) Take 1 tablet by mouth daily.      nystatin (MYCOSTATIN/NYSTOP) powder APPLY 1 POWDER TOPICALLY THREE TIMES DAILY 45 g 0   potassium chloride SA (KLOR-CON M) 20 MEQ  tablet TAKE 1 TABLET BY MOUTH DAILY 100 tablet 2   simvastatin (ZOCOR) 40 MG tablet Take 1 tablet (40 mg total) by mouth at bedtime. 100 tablet 3   triamcinolone cream (KENALOG) 0.5 % Apply 1 Application topically 2 (two) times daily. To affected areas, for up to 2 weeks. 30 g 2   triamterene-hydrochlorothiazide (DYAZIDE) 37.5-25 MG capsule Take 1 each (1 capsule total) by mouth daily. 100 capsule 3   zinc gluconate 50 MG tablet Take 50 mg by mouth daily.     No current facility-administered medications for this visit.   Facility-Administered Medications Ordered in Other Visits  Medication Dose Route Frequency Provider Last Rate Last Admin   fluorouracil (ADRUCIL) 4,350 mg in sodium chloride 0.9 % 63 mL chemo infusion  2,400 mg/m2 (Treatment Plan Recorded) Intravenous 1 day or 1 dose [name], MD   Infusion Verify at 08/14/23 1533   heparin lock flush 100 UNIT/ML injection              PHYSICAL EXAMINATION: ECOG PERFORMANCE STATUS: 1 - Symptomatic but completely ambulatory  Physical Exam Constitutional:      General: She is not in acute distress. HENT:     Head: Normocephalic and atraumatic.  Eyes:     General: No scleral icterus. Cardiovascular:     Rate and Rhythm: Normal rate.     Heart sounds: Normal heart sounds.  Pulmonary:     Effort: Pulmonary effort is normal. No respiratory distress.     Breath sounds: No wheezing.  Abdominal:     General: Bowel sounds are normal. There is no distension.     Palpations: Abdomen is soft.     Comments: + Colostomy bag   Musculoskeletal:        General: No deformity. Normal range  of motion.     Cervical back: Normal range of motion and neck supple.  Skin:    General: Skin is warm and dry.  Neurological:     Mental Status: She is alert and oriented to person, place, and time. Mental status is at baseline.     Cranial Nerves: No cranial nerve deficit.     Coordination: Coordination normal.       LABORATORY DATA:  I have reviewed the data as listed    Latest Ref Rng & Units 08/14/2023   10:18 AM 08/06/2023    8:40 AM 07/23/2023    8:46 AM  CBC  WBC 4.0 - 10.5 K/uL 3.7  4.1  5.0   Hemoglobin 12.0 - 15.0 g/dL 16.1  09.6  04.5   Hematocrit 36.0 - 46.0 % 36.1  34.3  34.9   Platelets 150 - 400 K/uL 257  262  216       Latest Ref Rng & Units 08/14/2023   10:18 AM 08/06/2023    8:36 AM 07/23/2023    8:47 AM  CMP  Glucose 70 - 99 mg/dL 409  811  914   BUN 8 - 23 mg/dL 18  13  15    Creatinine 0.44 - 1.00 mg/dL 7.82  9.56  2.13   Sodium 135 - 145 mmol/L 138  138  137   Potassium 3.5 - 5.1 mmol/L 3.6  3.5  3.6   Chloride 98 - 111 mmol/L 104  101  102   CO2 22 - 32 mmol/L 27  27  28    Calcium 8.9 - 10.3 mg/dL 8.6  8.6  8.7   Total Protein 6.5 - 8.1 g/dL 6.5  6.5  6.5   Total Bilirubin 0.3 - 1.2 mg/dL 0.6  0.4  0.5   Alkaline Phos 38 - 126 U/L 68  60  71   AST 15 - 41 U/L 38  41  38   ALT 0 - 44 U/L 24  26  27       RADIOGRAPHIC STUDIES: I have personally reviewed the radiological images as listed and agreed with the findings in the report. NM PET Image Restag (PS) Skull Base To Thigh  Result Date: 07/11/2023 CLINICAL DATA:  Subsequent treatment strategy for colorectal carcinoma. EXAM: NUCLEAR MEDICINE PET SKULL BASE TO THIGH TECHNIQUE: 8.66 mCi F-18 FDG was injected intravenously. Full-ring PET imaging was performed from the skull base to thigh after the radiotracer. CT data was obtained and used for attenuation correction and anatomic localization. Fasting blood glucose: 64 mg/dl COMPARISON:  CT chest, abdomen and pelvis 06/25/2023 and PET-CT from 09/19/2022  FINDINGS: Mediastinal blood pool activity: SUV max 2.37 Liver activity: SUV max NA NECK: No hypermetabolic lymph nodes in the neck. Incidental CT findings: None. CHEST: Left supraclavicular lymph node measures 0.6 cm within SUV max of 4.85, image 32/6. This is new when compared with the previous PET-CT. On 06/25/2023 this measured the same. Multifocal bilateral pulmonary nodules are identified compatible with pulmonary metastasis. -Index nodule within the right middle lobe measures 1.4 cm with SUV max of 4.30 unchanged in size from 06/25/2023. On the PET-CT from 09/19/2022 this measured 1.2 cm and had an SUV max of 0.74. -Additional scattered lung nodules are identified throughout both lungs most of which are too small to characterize by PET-CT. The largest of these is in the anteromedial right middle lobe measuring 8 mm with SUV max of 1.22, image 37/2. Stable from 06/25/2023. On the PET-CT from 09/19/2022 this measured 8 mm with SUV max of 1.17. Incidental CT findings: No pleural effusion or consolidative change. Aortic atherosclerotic calcifications. ABDOMEN/PELVIS: No tracer avid liver metastases. There is no abnormal tracer uptake identified within the liver, pancreas, or spleen. Multifocal tracer avid abdominopelvic lymph nodes are identified. Tracer avid left periaortic lymph node measures 8 mm within SUV max of 8.36. This is new compared with the previous exam. Right common iliac lymph node measures 1 cm and has an SUV max of 6.6, image 107/6. This is also new compared with the prior exam. Right external iliac lymph node measures 7 mm within SUV max of 6.06. This is similar to the recent CT from 06/25/2023. On the previous PET-CT from 09/19/22 this measured 7 mm with SUV max of 7.7. More distally, there is a new tracer avid right external iliac lymph node which measures 1.4 cm within SUV max of 6.63. Right inguinal lymph node measures 1.1 cm within SUV max of 5.16. Formally this measured 0.9 cm with SUV max  of 5.13. No tracer avid peritoneal nodule Incidental CT findings: Left lower quadrant descending colostomy. Aortic atherosclerosis. SKELETON: Diffuse increased radiotracer uptake throughout the marrow space is identified diminishing sensitivity for detecting osseous metastasis. No abnormal increased radiotracer uptake identified above background increased bone marrow activity to indicate metabolically active bone metastases. Incidental CT findings: None. IMPRESSION: 1. Multifocal tracer avid abdominopelvic lymph nodes are identified compatible with metastatic disease. Although similar when compared with 06/25/2023, there are new tracer avid abdominopelvic lymph nodes identified when compared with the previous PET-CT from 09/19/2022. 2. Multifocal bilateral pulmonary nodules are identified compatible with pulmonary metastasis. The largest of these is in the right middle lobe measuring 1.4  cm with SUV max of 4.30. This is unchanged in size from 06/25/2023. On the PET-CT from 09/19/2022 this measured 1.2 cm and had an SUV max of 0.74. The remaining lung nodules are too small to characterize by PET-CT. 3. Diffuse increased radiotracer uptake throughout the marrow space is identified diminishing sensitivity for detecting osseous metastasis. No abnormal increased radiotracer uptake identified above background increased bone marrow activity to indicate metabolically active bone metastases. 4.  Aortic Atherosclerosis (ICD10-I70.0). Electronically Signed   By: Signa Kell M.D.   On: 07/11/2023 12:08   CT CHEST ABDOMEN PELVIS W CONTRAST  Result Date: 06/25/2023 CLINICAL DATA:  Follow-up metastatic rectal cancer. * Tracking Code: BO * EXAM: CT CHEST, ABDOMEN, AND PELVIS WITH CONTRAST TECHNIQUE: Multidetector CT imaging of the chest, abdomen and pelvis was performed following the standard protocol during bolus administration of intravenous contrast. RADIATION DOSE REDUCTION: This exam was performed according to the  departmental dose-optimization program which includes automated exposure control, adjustment of the mA and/or kV according to patient size and/or use of iterative reconstruction technique. CONTRAST:  85mL OMNIPAQUE IOHEXOL 300 MG/ML  SOLN COMPARISON:  Multiple priors including most recent CT March 20, 2023. FINDINGS: CT CHEST FINDINGS Cardiovascular: Right chest Port-A-Cath with tip in the right atrium. Aortic atherosclerosis. Enlarged main pulmonary artery measuring 3.6 cm. Coronary artery calcifications normal size heart. No significant pericardial effusion/thickening. Mediastinum/Nodes: No suspicious thyroid nodule. No pathologically enlarged mediastinal, hilar or axillary lymph nodes esophagus is grossly unremarkable Lungs/Pleura: Increased size of the solid bilateral pulmonary nodules. For reference: -medial right middle lobe pulmonary nodule measures 17 x 13 mm on image 102/3 previously 14 x 11 mm. No pleural effusion. No pneumothorax. Scattered atelectasis/scarring. Musculoskeletal: Thoracic spondylosis. CT ABDOMEN PELVIS FINDINGS Hepatobiliary: Stable 8 mm subcapsular lesion in the anterior inferior right hepatic lobe segment V on image 71/2 common non FDG avid on prior PET-CT stable over multiple prior examinations favored benign. No discrete suspicious hepatic lesion identified. Gallbladder surgically absent. Similar mild dilation of the biliary tree with the common duct measuring 10 mm but smoothly tapering to the level of the ampulla and favored reservoir effect post cholecystectomy Pancreas: No pancreatic ductal dilation or evidence of acute inflammation. Spleen: No splenomegaly.  Stable 11 mm splenic cyst on image 61/2 Adrenals/Urinary Tract: Bilateral adrenal glands appear normal. No hydronephrosis stable benign bilateral renal cysts, including a 2.1 cm lesion in the lateral interpolar left kidney which measures Hounsfield units slightly greater than that of simple fluid but was previously classifies as  a cyst on MRI March 16, 2020, also stable are the hypodense renal lesions technically too small to accurately characterize but statistically likely to reflect cysts. These are considered benign and requiring no independent imaging follow-up. Urinary bladder is unremarkable for degree of distension Stomach/Bowel: Radiopaque enteric contrast material traverses the ileocecal valve. Stomach is unremarkable for degree of distension. No pathologic dilation of small or large bowel. Normal appendix. Colonic diverticulosis without findings of acute diverticulitis Prior abdominoperineal resection with mid transverse end colostomy in the left anterior abdominal wall. Fat containing peristomal hernia. Vascular/Lymphatic: Aortic atherosclerosis. Smooth IVC contours. The portal, splenic and superior mesenteric veins are patent. Previously indexed right external iliac lymph node measures 11 mm in short axis on image 100/2 previously 6 mm. Previously indexed inguinal lymph node measures 14 mm in short axis on image 107/2 previously 11 mm. Reproductive: Status post hysterectomy and bilateral salpingo-oophorectomy. Other: No significant abdominopelvic free fluid. Fat containing peristomal hernia Musculoskeletal: No significant interval change  in the left iliac bone osseous metastasis on image 83/2. Multilevel degenerative changes spine. Probable postradiation change in the sacrum IMPRESSION: 1. Increased size of the solid bilateral pulmonary nodules, consistent with worsening metastatic disease. 2. Increased size of the right external iliac and inguinal lymph nodes, concerning for worsening metastatic disease. 3. No significant interval change in the left iliac bone osseous metastasis. 4. Enlarged main pulmonary artery measuring 3.6 cm, which can be seen in the setting of pulmonary arterial hypertension. 5.  Aortic Atherosclerosis (ICD10-I70.0). Electronically Signed   By: Maudry Mayhew M.D.   On: 06/25/2023 11:35   US Venous Img  Lower Unilateral Right  Result Date: 05/18/2023 CLINICAL DATA:  Right lower extremity swelling EXAM: RIGHT LOWER EXTREMITY VENOUS DOPPLER ULTRASOUND TECHNIQUE: Gray-scale sonography with compression, as well as color and duplex ultrasound, were performed to evaluate the deep venous system(s) from the level of the common femoral vein through the popliteal and proximal calf veins. COMPARISON:  01/13/2020 FINDINGS: VENOUS Small amount of nonocclusive thrombus noted in the right posterior tibial vein. The thrombus is echogenic and eccentric which suggests that it is chronic. Otherwise normal compressibility of the common femoral, superficial femoral, and popliteal veins, as well as the visualized calf veins. Visualized portions of profunda femoral vein and great saphenous vein unremarkable. No additional filling defects to suggest DVT on grayscale or color Doppler imaging. Doppler waveforms show normal direction of venous flow, normal respiratory plasticity and response to augmentation. Limited views of the contralateral common femoral vein are unremarkable. OTHER 3.9 x 1.1 x 1.2 cm fluid collection in the right popliteal fossa likely Baker's cyst. Limitations: none IMPRESSION: Small amount of chronic nonocclusive DVT noted in the right posterior tibial vein. Electronically Signed   By: Acquanetta Belling M.D.   On: 05/18/2023 14:36      NM PET Image Restag (PS) Skull Base To Thigh  Result Date: 07/11/2023 CLINICAL DATA:  Subsequent treatment strategy for colorectal carcinoma. EXAM: NUCLEAR MEDICINE PET SKULL BASE TO THIGH TECHNIQUE: 8.66 mCi F-18 FDG was injected intravenously. Full-ring PET imaging was performed from the skull base to thigh after the radiotracer. CT data was obtained and used for attenuation correction and anatomic localization. Fasting blood glucose: 64 mg/dl COMPARISON:  CT chest, abdomen and pelvis 06/25/2023 and PET-CT from 09/19/2022 FINDINGS: Mediastinal blood pool activity: SUV max 2.37 Liver  activity: SUV max NA NECK: No hypermetabolic lymph nodes in the neck. Incidental CT findings: None. CHEST: Left supraclavicular lymph node measures 0.6 cm within SUV max of 4.85, image 32/6. This is new when compared with the previous PET-CT. On 06/25/2023 this measured the same. Multifocal bilateral pulmonary nodules are identified compatible with pulmonary metastasis. -Index nodule within the right middle lobe measures 1.4 cm with SUV max of 4.30 unchanged in size from 06/25/2023. On the PET-CT from 09/19/2022 this measured 1.2 cm and had an SUV max of 0.74. -Additional scattered lung nodules are identified throughout both lungs most of which are too small to characterize by PET-CT. The largest of these is in the anteromedial right middle lobe measuring 8 mm with SUV max of 1.22, image 37/2. Stable from 06/25/2023. On the PET-CT from 09/19/2022 this measured 8 mm with SUV max of 1.17. Incidental CT findings: No pleural effusion or consolidative change. Aortic atherosclerotic calcifications. ABDOMEN/PELVIS: No tracer avid liver metastases. There is no abnormal tracer uptake identified within the liver, pancreas, or spleen. Multifocal tracer avid abdominopelvic lymph nodes are identified. Tracer avid left periaortic lymph node measures 8  mm within SUV max of 8.36. This is new compared with the previous exam. Right common iliac lymph node measures 1 cm and has an SUV max of 6.6, image 107/6. This is also new compared with the prior exam. Right external iliac lymph node measures 7 mm within SUV max of 6.06. This is similar to the recent CT from 06/25/2023. On the previous PET-CT from 09/19/22 this measured 7 mm with SUV max of 7.7. More distally, there is a new tracer avid right external iliac lymph node which measures 1.4 cm within SUV max of 6.63. Right inguinal lymph node measures 1.1 cm within SUV max of 5.16. Formally this measured 0.9 cm with SUV max of 5.13. No tracer avid peritoneal nodule Incidental CT  findings: Left lower quadrant descending colostomy. Aortic atherosclerosis. SKELETON: Diffuse increased radiotracer uptake throughout the marrow space is identified diminishing sensitivity for detecting osseous metastasis. No abnormal increased radiotracer uptake identified above background increased bone marrow activity to indicate metabolically active bone metastases. Incidental CT findings: None. IMPRESSION: 1. Multifocal tracer avid abdominopelvic lymph nodes are identified compatible with metastatic disease. Although similar when compared with 06/25/2023, there are new tracer avid abdominopelvic lymph nodes identified when compared with the previous PET-CT from 09/19/2022. 2. Multifocal bilateral pulmonary nodules are identified compatible with pulmonary metastasis. The largest of these is in the right middle lobe measuring 1.4 cm with SUV max of 4.30. This is unchanged in size from 06/25/2023. On the PET-CT from 09/19/2022 this measured 1.2 cm and had an SUV max of 0.74. The remaining lung nodules are too small to characterize by PET-CT. 3. Diffuse increased radiotracer uptake throughout the marrow space is identified diminishing sensitivity for detecting osseous metastasis. No abnormal increased radiotracer uptake identified above background increased bone marrow activity to indicate metabolically active bone metastases. 4.  Aortic Atherosclerosis (ICD10-I70.0). Electronically Signed   By: Signa Kell M.D.   On: 07/11/2023 12:08   CT CHEST ABDOMEN PELVIS W CONTRAST  Result Date: 06/25/2023 CLINICAL DATA:  Follow-up metastatic rectal cancer. * Tracking Code: BO * EXAM: CT CHEST, ABDOMEN, AND PELVIS WITH CONTRAST TECHNIQUE: Multidetector CT imaging of the chest, abdomen and pelvis was performed following the standard protocol during bolus administration of intravenous contrast. RADIATION DOSE REDUCTION: This exam was performed according to the departmental dose-optimization program which includes  automated exposure control, adjustment of the mA and/or kV according to patient size and/or use of iterative reconstruction technique. CONTRAST:  85mL OMNIPAQUE IOHEXOL 300 MG/ML  SOLN COMPARISON:  Multiple priors including most recent CT March 20, 2023. FINDINGS: CT CHEST FINDINGS Cardiovascular: Right chest Port-A-Cath with tip in the right atrium. Aortic atherosclerosis. Enlarged main pulmonary artery measuring 3.6 cm. Coronary artery calcifications normal size heart. No significant pericardial effusion/thickening. Mediastinum/Nodes: No suspicious thyroid nodule. No pathologically enlarged mediastinal, hilar or axillary lymph nodes esophagus is grossly unremarkable Lungs/Pleura: Increased size of the solid bilateral pulmonary nodules. For reference: -medial right middle lobe pulmonary nodule measures 17 x 13 mm on image 102/3 previously 14 x 11 mm. No pleural effusion. No pneumothorax. Scattered atelectasis/scarring. Musculoskeletal: Thoracic spondylosis. CT ABDOMEN PELVIS FINDINGS Hepatobiliary: Stable 8 mm subcapsular lesion in the anterior inferior right hepatic lobe segment V on image 71/2 common non FDG avid on prior PET-CT stable over multiple prior examinations favored benign. No discrete suspicious hepatic lesion identified. Gallbladder surgically absent. Similar mild dilation of the biliary tree with the common duct measuring 10 mm but smoothly tapering to the level of the ampulla and favored  reservoir effect post cholecystectomy Pancreas: No pancreatic ductal dilation or evidence of acute inflammation. Spleen: No splenomegaly.  Stable 11 mm splenic cyst on image 61/2 Adrenals/Urinary Tract: Bilateral adrenal glands appear normal. No hydronephrosis stable benign bilateral renal cysts, including a 2.1 cm lesion in the lateral interpolar left kidney which measures Hounsfield units slightly greater than that of simple fluid but was previously classifies as a cyst on MRI March 16, 2020, also stable are the  hypodense renal lesions technically too small to accurately characterize but statistically likely to reflect cysts. These are considered benign and requiring no independent imaging follow-up. Urinary bladder is unremarkable for degree of distension Stomach/Bowel: Radiopaque enteric contrast material traverses the ileocecal valve. Stomach is unremarkable for degree of distension. No pathologic dilation of small or large bowel. Normal appendix. Colonic diverticulosis without findings of acute diverticulitis Prior abdominoperineal resection with mid transverse end colostomy in the left anterior abdominal wall. Fat containing peristomal hernia. Vascular/Lymphatic: Aortic atherosclerosis. Smooth IVC contours. The portal, splenic and superior mesenteric veins are patent. Previously indexed right external iliac lymph node measures 11 mm in short axis on image 100/2 previously 6 mm. Previously indexed inguinal lymph node measures 14 mm in short axis on image 107/2 previously 11 mm. Reproductive: Status post hysterectomy and bilateral salpingo-oophorectomy. Other: No significant abdominopelvic free fluid. Fat containing peristomal hernia Musculoskeletal: No significant interval change in the left iliac bone osseous metastasis on image 83/2. Multilevel degenerative changes spine. Probable postradiation change in the sacrum IMPRESSION: 1. Increased size of the solid bilateral pulmonary nodules, consistent with worsening metastatic disease. 2. Increased size of the right external iliac and inguinal lymph nodes, concerning for worsening metastatic disease. 3. No significant interval change in the left iliac bone osseous metastasis. 4. Enlarged main pulmonary artery measuring 3.6 cm, which can be seen in the setting of pulmonary arterial hypertension. 5.  Aortic Atherosclerosis (ICD10-I70.0). Electronically Signed   By: Maudry Mayhew M.D.   On: 06/25/2023 11:35   US Venous Img Lower Unilateral Right  Result Date:  05/18/2023 CLINICAL DATA:  Right lower extremity swelling EXAM: RIGHT LOWER EXTREMITY VENOUS DOPPLER ULTRASOUND TECHNIQUE: Gray-scale sonography with compression, as well as color and duplex ultrasound, were performed to evaluate the deep venous system(s) from the level of the common femoral vein through the popliteal and proximal calf veins. COMPARISON:  01/13/2020 FINDINGS: VENOUS Small amount of nonocclusive thrombus noted in the right posterior tibial vein. The thrombus is echogenic and eccentric which suggests that it is chronic. Otherwise normal compressibility of the common femoral, superficial femoral, and popliteal veins, as well as the visualized calf veins. Visualized portions of profunda femoral vein and great saphenous vein unremarkable. No additional filling defects to suggest DVT on grayscale or color Doppler imaging. Doppler waveforms show normal direction of venous flow, normal respiratory plasticity and response to augmentation. Limited views of the contralateral common femoral vein are unremarkable. OTHER 3.9 x 1.1 x 1.2 cm fluid collection in the right popliteal fossa likely Baker's cyst. Limitations: none IMPRESSION: Small amount of chronic nonocclusive DVT noted in the right posterior tibial vein. Electronically Signed   By: Acquanetta Belling M.D.   On: 05/18/2023 14:36

## 2023-08-15 ENCOUNTER — Inpatient Hospital Stay (HOSPITAL_BASED_OUTPATIENT_CLINIC_OR_DEPARTMENT_OTHER): Payer: 59 | Admitting: Obstetrics and Gynecology

## 2023-08-15 ENCOUNTER — Inpatient Hospital Stay: Payer: 59

## 2023-08-15 VITALS — Wt 162.4 lb

## 2023-08-15 DIAGNOSIS — C20 Malignant neoplasm of rectum: Secondary | ICD-10-CM

## 2023-08-15 DIAGNOSIS — Z7901 Long term (current) use of anticoagulants: Secondary | ICD-10-CM | POA: Diagnosis not present

## 2023-08-15 DIAGNOSIS — N182 Chronic kidney disease, stage 2 (mild): Secondary | ICD-10-CM | POA: Diagnosis not present

## 2023-08-15 DIAGNOSIS — I82501 Chronic embolism and thrombosis of unspecified deep veins of right lower extremity: Secondary | ICD-10-CM | POA: Diagnosis not present

## 2023-08-15 DIAGNOSIS — I129 Hypertensive chronic kidney disease with stage 1 through stage 4 chronic kidney disease, or unspecified chronic kidney disease: Secondary | ICD-10-CM | POA: Diagnosis not present

## 2023-08-15 DIAGNOSIS — Z87891 Personal history of nicotine dependence: Secondary | ICD-10-CM | POA: Diagnosis not present

## 2023-08-15 DIAGNOSIS — R590 Localized enlarged lymph nodes: Secondary | ICD-10-CM | POA: Diagnosis not present

## 2023-08-15 DIAGNOSIS — E876 Hypokalemia: Secondary | ICD-10-CM | POA: Diagnosis not present

## 2023-08-15 DIAGNOSIS — Z5111 Encounter for antineoplastic chemotherapy: Secondary | ICD-10-CM | POA: Diagnosis not present

## 2023-08-15 DIAGNOSIS — D398 Neoplasm of uncertain behavior of other specified female genital organs: Secondary | ICD-10-CM

## 2023-08-15 DIAGNOSIS — R600 Localized edema: Secondary | ICD-10-CM | POA: Diagnosis not present

## 2023-08-15 DIAGNOSIS — I2510 Atherosclerotic heart disease of native coronary artery without angina pectoris: Secondary | ICD-10-CM | POA: Diagnosis not present

## 2023-08-15 DIAGNOSIS — Z86711 Personal history of pulmonary embolism: Secondary | ICD-10-CM | POA: Diagnosis not present

## 2023-08-15 DIAGNOSIS — D6481 Anemia due to antineoplastic chemotherapy: Secondary | ICD-10-CM | POA: Diagnosis not present

## 2023-08-15 DIAGNOSIS — C7951 Secondary malignant neoplasm of bone: Secondary | ICD-10-CM | POA: Diagnosis not present

## 2023-08-15 DIAGNOSIS — K219 Gastro-esophageal reflux disease without esophagitis: Secondary | ICD-10-CM | POA: Diagnosis not present

## 2023-08-15 DIAGNOSIS — Z79899 Other long term (current) drug therapy: Secondary | ICD-10-CM | POA: Diagnosis not present

## 2023-08-15 DIAGNOSIS — E7800 Pure hypercholesterolemia, unspecified: Secondary | ICD-10-CM | POA: Diagnosis not present

## 2023-08-15 DIAGNOSIS — L600 Ingrowing nail: Secondary | ICD-10-CM | POA: Diagnosis not present

## 2023-08-15 DIAGNOSIS — I700 Atherosclerosis of aorta: Secondary | ICD-10-CM | POA: Diagnosis not present

## 2023-08-15 LAB — CEA: CEA: 2276 ng/mL — ABNORMAL HIGH (ref 0.0–4.7)

## 2023-08-15 NOTE — Progress Notes (Signed)
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 palpable in the left groin medially. EXTREMITIES:  No peripheral edema. Atraumatic. No cyanosis SKIN:  Clear with no obvious rashes or skin changes.  NEURO:  Nonfocal. Well oriented.  Appropriate affect.  Pelvic: chaperoned by RN EGBUS: 1 cm hard lesion in clitoris with superficial ulceration. Probable metastatic anal cancer.  Vagina: one finger could be introduced and no obvious disease noted.      Chemistry      Component Value Date/Time   NA 138 08/14/2023 1018   K 3.6 08/14/2023 1018   CL 104 08/14/2023 1018   CO2 27 08/14/2023 1018   BUN 18 08/14/2023 1018   CREATININE 0.74 08/14/2023 1018   CREATININE 1.10 (H) 06/24/2021 0802      Component Value Date/Time   CALCIUM 8.6 (L) 08/14/2023 1018   ALKPHOS 68 08/14/2023 1018   AST 38  08/14/2023 1018   ALT 24 08/14/2023 1018   BILITOT 0.6 08/14/2023 1018        Lab Results  Component Value Date   WBC 3.7 (L) 08/14/2023   HGB 11.3 (L) 08/14/2023   HCT 36.1 08/14/2023   MCV 94.8 08/14/2023   PLT 257 08/14/2023   CT scan 12/25/22  IMPRESSION: 1. Stable  scattered solid pulmonary nodules. 2. Right inguinal and right pelvic lymph nodes which were hypermetabolic on prior PET-CT are stable. 3. Osseous lesion of the proximal right humerus demonstrates increased sclerosis when compared with the prior PET-CT, possibly due to treatment response. 4. 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 5. Aortic Atherosclerosis (ICD10-I70.0).   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   Vaginal margins were very close and had local recurrence in 2021 with CEA rising.  Treated by Dr Cathie Hoops for metastatic rectal cancer as below.  01/19/21 recurrence with lung metastasis.-Palliative -FOLFIRI plus bevacizumab.  Irinotecan was dropped in November 2022 due to side effects. Negative for UGT1A1*28 - radiographically stable, rise of CEA-July 2023 CT lung metastasis worse--> Dec 2023 PET showed progression in pelvic lymph nodes and bone lesions,--> 2nd line irinotecan +panitumumab-->CT March 2024 stable.  CEA nadir was in 500s, currently gradually increasing and CT scan 3/24 shows bone, nodal and lung metastases.    June 2024 CT showed mild lung progression/Progressive left iliac bone metastasis, vulvar mass difficult for biopsy in office, [employer] [facility] biopsy - no actionable variants --> 06/25/2023 CT showed progression of lung nodules [SLD increase 66mm] and iliac inguinal lymphadenopathy--> 07/23/23 switch to dose reduced FOLFOX   Saw Dr [name] 5/24 due to vulvar bump and spotting.  She noted a 1 cm anterior vulvar mass in clitoris and referred back to [facility]. We suspect this is recurrent disease, but not highly symptomatic so would not focus on treating this in view of widespread metastatic diseae. Patient does not have pain in the vulva now.  Because of the  location, it would not be feasible to biopsy this lesion in the clinic.  It would require general anesthesia in the OR, and do not think this is warranted.    Chronic stress urinary incontinence.   Medical co-morbidities complicating care: Hypercholesteremia; HTN; There is no height or weight on file to calculate BMI. Plan:   Problem List Items Addressed This Visit       Digestive   Rectal cancer (HCC) - Primary (Chronic)   This vulvar mass most likely represents spread of rectal cancer in vulva.  Causes occasional spotting, but not otherwise symptomatic.  Prior pelvic RT treatment with Dr Rushie Chestnut, so unclear if the vulva could be treated with radiation.    Discussed with Dr Cathie Hoops who is in agreement and she will continue with systemic therapy for her metastatic rectal cancer.    The patient's diagnosis, an outline of the further diagnostic and laboratory studies which will be required, the recommendation for surgery, and alternatives were discussed with her and her accompanying family members.  All questions were answered to their satisfaction.   Leida Lauth, MD   CC:  Dr. [name]

## 2023-08-16 ENCOUNTER — Inpatient Hospital Stay: Payer: 59

## 2023-08-16 DIAGNOSIS — L600 Ingrowing nail: Secondary | ICD-10-CM | POA: Diagnosis not present

## 2023-08-16 DIAGNOSIS — R600 Localized edema: Secondary | ICD-10-CM | POA: Diagnosis not present

## 2023-08-16 DIAGNOSIS — C20 Malignant neoplasm of rectum: Secondary | ICD-10-CM

## 2023-08-16 DIAGNOSIS — B372 Candidiasis of skin and nail: Secondary | ICD-10-CM | POA: Diagnosis not present

## 2023-08-16 DIAGNOSIS — K219 Gastro-esophageal reflux disease without esophagitis: Secondary | ICD-10-CM | POA: Diagnosis not present

## 2023-08-16 DIAGNOSIS — I82501 Chronic embolism and thrombosis of unspecified deep veins of right lower extremity: Secondary | ICD-10-CM | POA: Diagnosis not present

## 2023-08-16 DIAGNOSIS — Z79899 Other long term (current) drug therapy: Secondary | ICD-10-CM | POA: Diagnosis not present

## 2023-08-16 DIAGNOSIS — I700 Atherosclerosis of aorta: Secondary | ICD-10-CM | POA: Diagnosis not present

## 2023-08-16 DIAGNOSIS — E876 Hypokalemia: Secondary | ICD-10-CM | POA: Diagnosis not present

## 2023-08-16 DIAGNOSIS — Z86711 Personal history of pulmonary embolism: Secondary | ICD-10-CM | POA: Diagnosis not present

## 2023-08-16 DIAGNOSIS — Z556 Problems related to health literacy: Secondary | ICD-10-CM | POA: Diagnosis not present

## 2023-08-16 DIAGNOSIS — N182 Chronic kidney disease, stage 2 (mild): Secondary | ICD-10-CM | POA: Diagnosis not present

## 2023-08-16 DIAGNOSIS — Z87891 Personal history of nicotine dependence: Secondary | ICD-10-CM | POA: Diagnosis not present

## 2023-08-16 DIAGNOSIS — E7800 Pure hypercholesterolemia, unspecified: Secondary | ICD-10-CM | POA: Diagnosis not present

## 2023-08-16 DIAGNOSIS — I129 Hypertensive chronic kidney disease with stage 1 through stage 4 chronic kidney disease, or unspecified chronic kidney disease: Secondary | ICD-10-CM | POA: Diagnosis not present

## 2023-08-16 DIAGNOSIS — I10 Essential (primary) hypertension: Secondary | ICD-10-CM | POA: Diagnosis not present

## 2023-08-16 DIAGNOSIS — C7951 Secondary malignant neoplasm of bone: Secondary | ICD-10-CM | POA: Diagnosis not present

## 2023-08-16 DIAGNOSIS — R590 Localized enlarged lymph nodes: Secondary | ICD-10-CM | POA: Diagnosis not present

## 2023-08-16 DIAGNOSIS — D6481 Anemia due to antineoplastic chemotherapy: Secondary | ICD-10-CM | POA: Diagnosis not present

## 2023-08-16 DIAGNOSIS — Z5111 Encounter for antineoplastic chemotherapy: Secondary | ICD-10-CM | POA: Diagnosis not present

## 2023-08-16 DIAGNOSIS — I2510 Atherosclerotic heart disease of native coronary artery without angina pectoris: Secondary | ICD-10-CM | POA: Diagnosis not present

## 2023-08-16 DIAGNOSIS — C799 Secondary malignant neoplasm of unspecified site: Secondary | ICD-10-CM

## 2023-08-16 DIAGNOSIS — Z7901 Long term (current) use of anticoagulants: Secondary | ICD-10-CM | POA: Diagnosis not present

## 2023-08-16 DIAGNOSIS — L89152 Pressure ulcer of sacral region, stage 2: Secondary | ICD-10-CM | POA: Diagnosis not present

## 2023-08-16 MED ORDER — HEPARIN SOD (PORK) LOCK FLUSH 100 UNIT/ML IV SOLN
500.0000 [IU] | Freq: Once | INTRAVENOUS | Status: AC | PRN
  Administered 2023-08-16: 500 [IU]
  Filled 2023-08-16: qty 5

## 2023-08-16 MED ORDER — MAGNESIUM SULFATE 4 GM/100ML IV SOLN
4.0000 g | Freq: Once | INTRAVENOUS | Status: AC
  Administered 2023-08-16: 4 g via INTRAVENOUS
  Filled 2023-08-16: qty 100

## 2023-08-19 DIAGNOSIS — L89159 Pressure ulcer of sacral region, unspecified stage: Secondary | ICD-10-CM | POA: Diagnosis not present

## 2023-08-22 DIAGNOSIS — I10 Essential (primary) hypertension: Secondary | ICD-10-CM | POA: Diagnosis not present

## 2023-08-22 DIAGNOSIS — L89152 Pressure ulcer of sacral region, stage 2: Secondary | ICD-10-CM | POA: Diagnosis not present

## 2023-08-22 DIAGNOSIS — Z556 Problems related to health literacy: Secondary | ICD-10-CM | POA: Diagnosis not present

## 2023-08-22 DIAGNOSIS — Z79899 Other long term (current) drug therapy: Secondary | ICD-10-CM | POA: Diagnosis not present

## 2023-08-22 DIAGNOSIS — B372 Candidiasis of skin and nail: Secondary | ICD-10-CM | POA: Diagnosis not present

## 2023-08-22 DIAGNOSIS — Z87891 Personal history of nicotine dependence: Secondary | ICD-10-CM | POA: Diagnosis not present

## 2023-08-22 DIAGNOSIS — Z7901 Long term (current) use of anticoagulants: Secondary | ICD-10-CM | POA: Diagnosis not present

## 2023-08-24 DIAGNOSIS — Z556 Problems related to health literacy: Secondary | ICD-10-CM | POA: Diagnosis not present

## 2023-08-24 DIAGNOSIS — L89152 Pressure ulcer of sacral region, stage 2: Secondary | ICD-10-CM | POA: Diagnosis not present

## 2023-08-24 DIAGNOSIS — B372 Candidiasis of skin and nail: Secondary | ICD-10-CM | POA: Diagnosis not present

## 2023-08-24 DIAGNOSIS — Z79899 Other long term (current) drug therapy: Secondary | ICD-10-CM | POA: Diagnosis not present

## 2023-08-24 DIAGNOSIS — Z7901 Long term (current) use of anticoagulants: Secondary | ICD-10-CM | POA: Diagnosis not present

## 2023-08-24 DIAGNOSIS — I10 Essential (primary) hypertension: Secondary | ICD-10-CM | POA: Diagnosis not present

## 2023-08-24 DIAGNOSIS — Z87891 Personal history of nicotine dependence: Secondary | ICD-10-CM | POA: Diagnosis not present

## 2023-08-27 ENCOUNTER — Inpatient Hospital Stay: Payer: 59 | Attending: Oncology

## 2023-08-27 ENCOUNTER — Inpatient Hospital Stay (HOSPITAL_BASED_OUTPATIENT_CLINIC_OR_DEPARTMENT_OTHER): Payer: 59 | Admitting: Oncology

## 2023-08-27 ENCOUNTER — Inpatient Hospital Stay: Payer: 59

## 2023-08-27 ENCOUNTER — Encounter: Payer: Self-pay | Admitting: Oncology

## 2023-08-27 ENCOUNTER — Ambulatory Visit: Payer: 59

## 2023-08-27 VITALS — BP 133/66 | HR 82 | Temp 96.0°F | Resp 18 | Wt 160.6 lb

## 2023-08-27 DIAGNOSIS — E876 Hypokalemia: Secondary | ICD-10-CM

## 2023-08-27 DIAGNOSIS — R600 Localized edema: Secondary | ICD-10-CM | POA: Diagnosis not present

## 2023-08-27 DIAGNOSIS — T451X5A Adverse effect of antineoplastic and immunosuppressive drugs, initial encounter: Secondary | ICD-10-CM | POA: Diagnosis not present

## 2023-08-27 DIAGNOSIS — E7800 Pure hypercholesterolemia, unspecified: Secondary | ICD-10-CM | POA: Insufficient documentation

## 2023-08-27 DIAGNOSIS — Z9221 Personal history of antineoplastic chemotherapy: Secondary | ICD-10-CM | POA: Diagnosis not present

## 2023-08-27 DIAGNOSIS — K521 Toxic gastroenteritis and colitis: Secondary | ICD-10-CM

## 2023-08-27 DIAGNOSIS — C20 Malignant neoplasm of rectum: Secondary | ICD-10-CM

## 2023-08-27 DIAGNOSIS — R634 Abnormal weight loss: Secondary | ICD-10-CM | POA: Insufficient documentation

## 2023-08-27 DIAGNOSIS — Z86711 Personal history of pulmonary embolism: Secondary | ICD-10-CM

## 2023-08-27 DIAGNOSIS — Z5111 Encounter for antineoplastic chemotherapy: Secondary | ICD-10-CM

## 2023-08-27 DIAGNOSIS — I10 Essential (primary) hypertension: Secondary | ICD-10-CM | POA: Insufficient documentation

## 2023-08-27 DIAGNOSIS — Z87891 Personal history of nicotine dependence: Secondary | ICD-10-CM | POA: Diagnosis not present

## 2023-08-27 DIAGNOSIS — I700 Atherosclerosis of aorta: Secondary | ICD-10-CM | POA: Diagnosis not present

## 2023-08-27 DIAGNOSIS — I2510 Atherosclerotic heart disease of native coronary artery without angina pectoris: Secondary | ICD-10-CM | POA: Insufficient documentation

## 2023-08-27 DIAGNOSIS — C7951 Secondary malignant neoplasm of bone: Secondary | ICD-10-CM | POA: Insufficient documentation

## 2023-08-27 DIAGNOSIS — N939 Abnormal uterine and vaginal bleeding, unspecified: Secondary | ICD-10-CM

## 2023-08-27 DIAGNOSIS — K5730 Diverticulosis of large intestine without perforation or abscess without bleeding: Secondary | ICD-10-CM | POA: Diagnosis not present

## 2023-08-27 DIAGNOSIS — D6481 Anemia due to antineoplastic chemotherapy: Secondary | ICD-10-CM | POA: Diagnosis not present

## 2023-08-27 DIAGNOSIS — Z79899 Other long term (current) drug therapy: Secondary | ICD-10-CM | POA: Insufficient documentation

## 2023-08-27 DIAGNOSIS — K219 Gastro-esophageal reflux disease without esophagitis: Secondary | ICD-10-CM | POA: Diagnosis not present

## 2023-08-27 DIAGNOSIS — Z800 Family history of malignant neoplasm of digestive organs: Secondary | ICD-10-CM | POA: Diagnosis not present

## 2023-08-27 DIAGNOSIS — C799 Secondary malignant neoplasm of unspecified site: Secondary | ICD-10-CM

## 2023-08-27 LAB — CMP (CANCER CENTER ONLY)
ALT: 27 U/L (ref 0–44)
AST: 43 U/L — ABNORMAL HIGH (ref 15–41)
Albumin: 3.6 g/dL (ref 3.5–5.0)
Alkaline Phosphatase: 61 U/L (ref 38–126)
Anion gap: 7 (ref 5–15)
BUN: 15 mg/dL (ref 8–23)
CO2: 29 mmol/L (ref 22–32)
Calcium: 9 mg/dL (ref 8.9–10.3)
Chloride: 102 mmol/L (ref 98–111)
Creatinine: 0.73 mg/dL (ref 0.44–1.00)
GFR, Estimated: >60 mL/min (ref >60)
Glucose, Bld: 106 mg/dL — ABNORMAL HIGH (ref 70–99)
Potassium: 3.6 mmol/L (ref 3.5–5.1)
Sodium: 138 mmol/L (ref 135–145)
Total Bilirubin: 0.4 mg/dL (ref <1.2)
Total Protein: 6.5 g/dL (ref 6.5–8.1)

## 2023-08-27 LAB — CBC WITH DIFFERENTIAL (CANCER CENTER ONLY)
Abs Immature Granulocytes: 0 10*3/uL (ref 0.00–0.07)
Basophils Absolute: 0 10*3/uL (ref 0.0–0.1)
Basophils Relative: 1 %
Eosinophils Absolute: 0.2 10*3/uL (ref 0.0–0.5)
Eosinophils Relative: 4 %
HCT: 34.3 % — ABNORMAL LOW (ref 36.0–46.0)
Hemoglobin: 11.2 g/dL — ABNORMAL LOW (ref 12.0–15.0)
Immature Granulocytes: 0 %
Lymphocytes Relative: 30 %
Lymphs Abs: 1.1 10*3/uL (ref 0.7–4.0)
MCH: 30.4 pg (ref 26.0–34.0)
MCHC: 32.7 g/dL (ref 30.0–36.0)
MCV: 93 fL (ref 80.0–100.0)
Monocytes Absolute: 0.5 10*3/uL (ref 0.1–1.0)
Monocytes Relative: 13 %
Neutro Abs: 1.9 10*3/uL (ref 1.7–7.7)
Neutrophils Relative %: 52 %
Platelet Count: 158 10*3/uL (ref 150–400)
RBC: 3.69 MIL/uL — ABNORMAL LOW (ref 3.87–5.11)
RDW: 15.1 % (ref 11.5–15.5)
WBC Count: 3.7 10*3/uL — ABNORMAL LOW (ref 4.0–10.5)
nRBC: 0 % (ref 0.0–0.2)

## 2023-08-27 LAB — MAGNESIUM: Magnesium: 1.7 mg/dL (ref 1.7–2.4)

## 2023-08-27 MED ORDER — ACETAMINOPHEN 325 MG PO TABS
650.0000 mg | ORAL_TABLET | Freq: Once | ORAL | Status: AC
  Administered 2023-08-27: 650 mg via ORAL
  Filled 2023-08-27: qty 2

## 2023-08-27 MED ORDER — FLUOROURACIL CHEMO INJECTION 5 GM/100ML
2400.0000 mg/m2 | INTRAVENOUS | Status: DC
  Administered 2023-08-27: 4350 mg via INTRAVENOUS
  Filled 2023-08-27: qty 87

## 2023-08-27 MED ORDER — DEXTROSE 5 % IV SOLN
70.0000 mg/m2 | Freq: Once | INTRAVENOUS | Status: AC
  Administered 2023-08-27: 125 mg via INTRAVENOUS
  Filled 2023-08-27: qty 25

## 2023-08-27 MED ORDER — LEUCOVORIN CALCIUM INJECTION 350 MG
400.0000 mg/m2 | Freq: Once | INTRAVENOUS | Status: AC
  Administered 2023-08-27: 724 mg via INTRAVENOUS
  Filled 2023-08-27: qty 36.2

## 2023-08-27 MED ORDER — DEXTROSE 5 % IV SOLN
Freq: Once | INTRAVENOUS | Status: AC
  Filled 2023-08-27: qty 250

## 2023-08-27 MED ORDER — DIPHENHYDRAMINE HCL 25 MG PO CAPS
25.0000 mg | ORAL_CAPSULE | Freq: Once | ORAL | Status: AC
  Administered 2023-08-27: 25 mg via ORAL
  Filled 2023-08-27: qty 1

## 2023-08-27 MED ORDER — DEXAMETHASONE SODIUM PHOSPHATE 10 MG/ML IJ SOLN
10.0000 mg | Freq: Once | INTRAMUSCULAR | Status: AC
  Administered 2023-08-27: 10 mg via INTRAVENOUS
  Filled 2023-08-27: qty 1

## 2023-08-27 MED ORDER — PALONOSETRON HCL INJECTION 0.25 MG/5ML
0.2500 mg | Freq: Once | INTRAVENOUS | Status: AC
  Administered 2023-08-27: 0.25 mg via INTRAVENOUS
  Filled 2023-08-27: qty 5

## 2023-08-27 NOTE — Progress Notes (Signed)
Hematology/Oncology Progress note [phone] [phone]      CHIEF COMPLAINTS/REASON FOR VISIT:  Follow up for rectal cancer  ASSESSMENT & PLAN:   Cancer Staging  Rectal cancer Jacqueline Yoder) Staging form: Colon and Rectum, AJCC 8th Edition - Pathologic stage from 10/06/2019: Stage IIC (ypT4b, pN0, cM0) - Signed by Jacqueline Patience, MD on 10/06/2019 - Clinical stage from 06/30/2020: Jacqueline Yoder - Signed by Jacqueline Patience, MD on 04/17/2022 - Pathologic: Stage Unknown (rpTX, pNX, cM1) - Signed by Jacqueline Patience, MD on 01/31/2021   Rectal cancer Jacqueline Yoder) History of stage IIIC Rectal cancer, s/p TNT, followed by 09/17/19 APR/posterior vaginectomy/TAH/BSO/VY-flap, pT4b pN0 with close vaginal margin 0.2 mm.  Uterus and ovaries negative for malignancy. palliative radiation to vaginal recurrence- 01/19/21 recurrence with lung metastasis.-Palliative -FOLFIRI plus bevacizumab.  Irinotecan was dropped in November 2022 due to Yoder effects. Negative for UGT1A1*28 - radiographically stable, rise of CEA-July 2023 CT lung metastasis worse--> Dec 2023 PET showed progression in pelvic lymph nodes and bone lesions,--> 2nd line irinotecan +panitumumab-->CT March 2024 stable. --> June 2024 CT showed mild lung progression/Progressive left iliac bone metastasis, vulvar mass difficult for biopsy in office,  Tempus Liquid biopsy - no actionable variants --> 06/25/2023 CT showed progression of lung nodules [SLD increase 16mm] and iliac inguinal lymphadenopathy--> 07/23/23 switch to dose reduced FOLFOX   Labs are reviewed and discussed with patient, CEA nadir was in 490, currently gradually increasing Proceed with dose reduced FOLFOX  Plan to repeat CT after 4 cycles of FOLFOX  Encounter for antineoplastic chemotherapy Chemotherapy plan as listed above.   Anemia due to antineoplastic chemotherapy Hb stable, observation  Vaginal spotting likely recurrent rectal cancer involving vagina.  Intermittent vaginal spotting.  Vulvar  mass location is difficult for biopsy in the clinic per gynonc.  Dr. Rushie Yoder will not offer radiation,   History of pulmonary embolism History of PE and bilateral lower extremity DVT  05/18/23 Repeat right LE US showed chronic non occlusive DVT Continue  Eliquis 2.5 mg twice daily for anticoagulation prophylaxis. Recommend leg elevation and compression stocking.   Chemotherapy induced diarrhea continue lomotil  QID PRN as instructed.   Hypokalemia Continue potassium chloride 20 mEq daily.      Follow up 2 weeks.  All questions were answered. The patient knows to call the clinic with any problems, questions or concerns.  Jacqueline Patience, MD, PhD [facility] Hematology Oncology 08/27/2023      HISTORY OF PRESENTING ILLNESS:  Oncology History  Rectal cancer (HCC)  01/23/2019 Initial Diagnosis   Rectal cancer [facility]) Patient initially presented with complaints of postmenopausal bleeding on 08/16/2018.  History of was menopausal vaginal bleeding in 2016 which resulted in cervical polypectomy.  Pathology 02/04/2015 showed cervical polyp, consistent with benign endometrial polyp.  [name] after polypectomy due to anxiety associated with pelvic exams.  pelvic exam on 08/16/2018 reviewed cervical abnormality and from enlarged uterus. Seen by Dr. Valentino Yoder on 10/29/2018.  Endometrial biopsy and a Pap smear was performed. 10/29/2018 Pap smear showed adenocarcinoma, favor endometrial origin. 10/29/2018 endometrial biopsy showed endometrioid carcinoma, FIGO grade 1.  10/29/2018- TA & TV Ultrasound revealed: Anteverted uterus measuring 8.7 x 5.6 x 6.4 cm without evidence of focal masses.  The endometrium measuring 24.1 mm (thickened) and heterogeneous.  Right and left ovaries not visualized.  No adnexal masses identified.  No free fluid in cul-de-sac.  Patient was seen by Dr. Sonia Yoder in clinic on 11/13/2018.  Cervical exam reveals 2 cm exophytic irregular mass consistent  with malignancy.     11/19/2018 CT chest abdomen pelvis with contrast showed thickened endometrium with some irregularity compatible with the provided diagnosis of endometrial malignancy.  There is a mildly prominent left inguinal node 1.4 cm.  Patient was seen by Dr. Johnnette Yoder on 11/20/2018 and left groin lymph node biopsy was recommended.  11/26/2018 patient underwent left inguinal lymph node biopsy. Pathology showed metastatic adenocarcinoma consistent with colorectal origin.  CDX 2+.  Case was discussed on tumor board.  Recommend colonoscopy for further evaluation.  Patient reports significant weight loss 30 pounds over the last year.  Chronic vaginal spotting. Change of bowel habits the past few months.  More constipated.  Family history positive for brother who has colon cancer prostate cancer.  patient has underwent colonoscopy on 12/03/2018 which reviewed a nonobstructing large mass in the rectum.  Also chronic fistula.  Mass was not circumferential.  This was biopsied with a cold forceps for histology.  Pathology came back hyperplastic polyp negative for dysplasia and malignancy. [name], patient underwent flex sigmoidoscopy on 12/06/2018 with rebiopsy of the rectal mass. This time biopsy results came back positive for invasive colorectal adenocarcinoma, moderately differentiated. Immunotherapy for nearly mismatch repair protein (MMR ) was performed.  There is no loss of MMR expression.  low probability of MSI high.   # Seen by [name] for evaluation of resectability for rectal cancer. In addition, she also had a second opinion with [facility] pathology where her endometrial biopsy pathology was changed to  adenocarcinoma, consistent with colorectal primary.   [name]. She has home health that has been assisting with ostomy care  Patient was also evaluated by [facility] oncology.  Recommendation is to proceed with TNT with concurrent chemoradiation  followed by neoadjuvant chemotherapy followed by surgical resection. Patient prefers to have treatment done locally with [facility].    02/04/2019 - 02/04/2019 Chemotherapy   The patient had capecitabine (XELODA) 150 MG tablet, 150 mg (100 % of original dose 150 mg), Oral, 2 times daily after meals, 1 of 1 cycle, Start date: 01/27/2019, End date: 04/03/2019 Dose modification: 150 mg (original dose 150 mg, Cycle 1) capecitabine (XELODA) 500 MG tablet, 1,500 mg (100 % of original dose 1,500 mg), Oral, 2 times daily after meals, 1 of 1 cycle, Start date: 01/27/2019, End date: 04/03/2019 Dose modification: 1,500 mg (original dose 1,500 mg, Cycle 1)  for chemotherapy treatment.    04/09/2019 - 07/18/2019 Chemotherapy   The patient had palonosetron (ALOXI) injection 0.25 mg, 0.25 mg, Intravenous,  Once, 8 of 8 cycles Administration: 0.25 mg (04/09/2019), 0.25 mg (04/23/2019), 0.25 mg (05/07/2019), 0.25 mg (05/21/2019), 0.25 mg (06/04/2019), 0.25 mg (06/18/2019), 0.25 mg (07/02/2019), 0.25 mg (07/16/2019) leucovorin 850 mg in dextrose 5 % 250 mL infusion, 844 mg, Intravenous,  Once, 8 of 8 cycles Administration: 850 mg (04/09/2019), 850 mg (04/23/2019), 850 mg (05/07/2019), 850 mg (05/21/2019), 800 mg (06/18/2019), 800 mg (07/02/2019), 800 mg (07/16/2019) oxaliplatin (ELOXATIN) 150 mg in dextrose 5 % 500 mL chemo infusion, 71 mg/m2 = 160 mg (100 % of original dose 75 mg/m2), Intravenous,  Once, 8 of 8 cycles Dose modification: 75 mg/m2 (original dose 75 mg/m2, Cycle 1, Reason: Provider Judgment) Administration: 150 mg (04/09/2019), 150 mg (04/23/2019), 150 mg (05/07/2019), 150 mg (05/21/2019), 150 mg (06/04/2019), 150 mg (06/18/2019), 150 mg (07/02/2019), 150 mg (07/16/2019) fluorouracil (ADRUCIL) 5,000 mg in sodium chloride 0.9 % 150 mL chemo infusion, 2,370 mg/m2 = 5,050 mg, Intravenous, 1 Day/Dose,  8 of 8 cycles Administration: 5,000 mg (04/09/2019), 5,000 mg (04/23/2019), 5,000 mg (05/07/2019), 5,000 mg (05/21/2019), 5,000 mg (06/04/2019), 5,000 mg  (06/18/2019), 5,000 mg (07/02/2019), 5,000 mg (07/16/2019)  for chemotherapy treatment.    10/06/2019 Cancer Staging   Staging form: Colon and Rectum, AJCC 8th Edition - Pathologic stage from 10/06/2019: Stage IIC (ypT4b, pN0, cM0) - Signed by Jacqueline Patience, MD on 10/06/2019   06/30/2020 Cancer Staging   Staging form: Colon and Rectum, AJCC 8th Edition - Clinical stage from 06/30/2020: Jacqueline Yoder - Signed by Jacqueline Patience, MD on 04/17/2022 Stage prefix: Recurrence Total positive nodes: 1   07/16/2020 - 07/19/2020 Chemotherapy         01/31/2021 Cancer Staging   Staging form: Colon and Rectum, AJCC 8th Edition - Pathologic: Stage Unknown (rpTX, pNX, cM1) - Signed by Jacqueline Patience, MD on 01/31/2021 Stage prefix: Recurrence   07/14/2022 Imaging   Bone scan  1. No evidence of skeletal metastatic disease in the hips. 2. Asymmetric increased activity in the RIGHT humeral head is favored degenerative as described above. If pain in the RIGHT shoulder consider further evaluation with plain film or MRI   07/17/2022 Imaging   1. Stable scattered small bilateral pulmonary nodules, largest 8 by 6 mm in the right middle lobe. 2. Subtle chronically stable posterior cortical irregularity and heterogeneity in the left iliac bone, not appreciable on the bone scan of 07/14/2022. This has been present at least over the past year and could reflect radiation therapy related findings although strictly speaking, malignant involvement of the left iliac bone is difficult to exclude. If clinically warranted this could be further assessed with nuclear medicine PET-CT or MRI of the bony pelvis with and without contrast. 3. Pelvic floor laxity with loops of small bowel extending 3 cm below the pubococcygeal line. 4. Aortic and systemic atherosclerosis as detailed above. Aortic Atherosclerosis    12/26/2022 Imaging   CT chest abdomen pelvis w contrast 1. Stable scattered solid pulmonary nodules. 2. Right inguinal and right pelvic  lymph nodes which were hypermetabolic on prior PET-CT are stable. 3. Osseous lesion of the proximal right humerus demonstrates increased sclerosis when compared with the prior PET-CT, possibly due to treatment response. 4. 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 5. Aortic Atherosclerosis     06/25/2023 Imaging   CT chest abdomen pelvis w contrast showed 1. Increased size of the solid bilateral pulmonary nodules,consistent with worsening metastatic disease. 2. Increased size of the right external iliac and inguinal lymph nodes, concerning for worsening metastatic disease. 3. No significant interval change in the left iliac bone osseous metastasis. 4. Enlarged main pulmonary artery measuring 3.6 cm, which can be seen in the setting of pulmonary arterial hypertension. 5.  Aortic Atherosclerosis (ICD10-I70.0)     Miscellaneous   Tempus liquid biopsy showed no actionable variants.  NOTCH1 missense variant, BRCA2 missense variant , MYC missense variant   07/11/2023 Imaging   PET showed 1. Multifocal tracer avid abdominopelvic lymph nodes are identified compatible with metastatic disease. 2. Multifocal bilateral pulmonary nodules are identified compatible with pulmonary metastasis.  3. Diffuse increased radiotracer uptake throughout the marrow space is identified diminishing sensitivity for detecting osseous metastasis. No abnormal increased radiotracer uptake identified above background increased bone marrow activity to indicate metabolically active bone metastases. 4.  Aortic Atherosclerosis   Metastatic adenocarcinoma [facility] Yoder)       # Oncology Treatment:  02/03/2019- 03/19/2019  concurrent Xeloda and radiation.  Xeloda dose 825mg  /m2 BID -  rounded to 1650mg  BID- on days of radiation. 04/09/2019, started on FOLFOX with bolus early.  Omitted.  07/16/2019 finished 8 cycles of FOLFOX. 09/17/19 APR/posterior vaginectomy/TAH/BSO/VY-flap pT4b pN0 with close vaginal margin 0.2 mm.  Uterus and  ovaries negative for malignancy. Patient reports bilateral lower extremity numbness and tingling, intermittent, left worse than right. She has lost a lot of weight since her APR surgery.   #Family history with half brother having's history of colon cancer prostate cancer.  Personal history of colorectal cancer.  Patient has not decided if she wants genetic testing.    # history of PE( 01/13/2020)  in the bilateral lower extremity DVT (01/13/2020).   She finishes 6 months of anticoagulation with Eliquis 5 mg twice daily. Now switched to Eliquis 2.5 mg twice daily..  # She has now developed recurrent disease. #06/30/20  vaginal introitus mass biopsied. Pathology is consistent with metastatic colorectal adenocarcinoma I have discussed with [name]  Dr. Luciano Cutter and the mass is not resectable. Patient has also had colonoscopy by Dr. Tobi Bastos yesterday. Normal examination. # 07/16/2020 cycle 1 FOLFIRI  # 07/20/2020 PET scan was done for further evaluation, images are consistent with local recurrence, no distant metastasis. #Discussed with radiation oncology Dr. Rushie Yoder will recommends concurrent chemotherapy and radiation. 08/02/2020-08/16/2020, patient starts radiation.  Xeloda was held due to neutropenia 08/17/2020,-09/06/2020 Xeloda 1500 mg twice daily concurrently with radiation  01/31/21 started on FOLFIRI + Bev 05/18/2021 CT chest abdomen pelvis showed Previously noted enlargement of bilateral inguinal lymph nodes is resolved, consistent with treatment response of nodal metastatic disease. Interval decrease in size of multiple small bilateral pulmonary nodules, consistent with treatment response of pulmonary metastatic disease. No evidence of new metastatic disease. 05/24/2021 - 08/30/2021, continued on FOLFIRI plus bevacizumab.  Irinotecan dose was reduced, eventually 100mg /m2  09/02/2021, CT chest abdomen pelvis without contrast Showed small bilateral pulmonary nodules, unchanged.  Stable metastatic  disease.  No noncontrast evidence of new metastatic disease in the chest abdomen pelvis.  Small parastomal hernia.  Enlargement of main pulmonary artery.  Coronary artery disease.  09/13/2021, maintenance 5-FU/bevacizumab 11/28/2021, 5-FU/Irinotecan/bevacizumab.  Irinotecan 100 mg/m2 was added back due to progressively increasing CEA.  -right lower extremity edema, US showed non occlusive DVT, edema has improved.   INTERVAL HISTORY Jacqueline Yoder is a 77 y.o. female who has above history reviewed by me presents for follow-up of rectal cancer. On Irinotecan and panitumumab. she tolerates well.   She was accompanied by her daughter. - chronic diarrhea is manageable. Was seen by Dr. Rushie Yoder, no plan of palliative RT currently.  She take Lomotil PRN, usually 1-2 time per day.  + vaginal spotting sometimes  Review of Systems  Constitutional:  Positive for fatigue. Negative for appetite change, chills, fever and unexpected weight change.  HENT:   Negative for hearing loss and voice change.   Eyes:  Negative for eye problems.  Respiratory:  Negative for chest tightness and cough.   Cardiovascular:  Negative for chest pain.  Gastrointestinal:  Positive for diarrhea. Negative for abdominal distention, abdominal pain, blood in stool, constipation and nausea.  Endocrine: Negative for hot flashes.  Genitourinary:  Positive for vaginal bleeding. Negative for difficulty urinating and frequency.   Musculoskeletal:  Positive for arthralgias.  Skin:  Negative for itching.  Neurological:  Negative for extremity weakness and numbness.  Hematological:  Negative for adenopathy.  Psychiatric/Behavioral:  Negative for confusion.     MEDICAL HISTORY:  Past Medical History:  Diagnosis Date   Allergy  Arthritis    Blood clot in vein    Family history of colon cancer    GERD (gastroesophageal reflux disease)    Hypercholesteremia    Hypertension    Hypertension    Lower extremity edema    Personal  history of chemotherapy    Rectal cancer (HCC) 12/2018   Urinary incontinence     SURGICAL HISTORY: Past Surgical History:  Procedure Laterality Date   ABDOMINAL HYSTERECTOMY     CHOLECYSTECTOMY  1971   COLONOSCOPY WITH PROPOFOL N/A 12/03/2018   Procedure: COLONOSCOPY WITH PROPOFOL;  Surgeon: Midge Minium, MD;  Location: [facility] ENDOSCOPY;  Service: Endoscopy;  Laterality: N/A;   COLONOSCOPY WITH PROPOFOL N/A 07/15/2020   Procedure: COLONOSCOPY WITH PROPOFOL;  Surgeon: Wyline Mood, MD;  Location[facility] ENDOSCOPY;  Service: Gastroenterology;  Laterality: N/A;   FLEXIBLE SIGMOIDOSCOPY N/A 12/06/2018   Procedure: FLEXIBLE SIGMOIDOSCOPY;  Surgeon: Wyline Mood, MD;  Location: [facility] ENDOSCOPY;  Service: Endoscopy;  Laterality: N/A;   LAPAROSCOPIC COLOSTOMY  01/06/2019   PORTACATH PLACEMENT N/A 04/03/2019   Procedure: INSERTION PORT-A-CATH;  Surgeon: Leafy Ro, MD;  Location: [facility] ORS;  Service: General;  Laterality: N/A;    SOCIAL HISTORY: Social History   Socioeconomic History   Marital status: Widowed    Spouse name: Not on file   Number of children: Not on file   Years of education: Not on file   Highest education level: Some college, no degree  Occupational History   Not on file  Tobacco Use   Smoking status: Former    Current packs/day: 0.00    Types: Cigarettes    Quit date: 12/02/1977    Years since quitting: 45.7   Smokeless tobacco: Former  Building services engineer status: Never Used  Substance and Sexual Activity   Alcohol use: Never   Drug use: Never   Sexual activity: Not Currently    Birth control/protection: None  Other Topics Concern   Not on file  Social History Narrative   Lives with daughter   Social Determinants of Health   Financial Resource Strain: Low Risk  (07/09/2023)   Overall Financial Resource Strain (CARDIA)    Difficulty of Paying Living Expenses: Not very hard  Food Insecurity: No Food Insecurity (07/09/2023)   Hunger Vital Sign    Worried About  Running Out of Food in the Last Year: Never true    Ran Out of Food in the Last Year: Never true  Transportation Needs: No Transportation Needs (07/09/2023)   PRAPARE - Administrator, Civil Service (Medical): No    Lack of Transportation (Non-Medical): No  Physical Activity: Insufficiently Active (07/09/2023)   Exercise Vital Sign    Days of Exercise per Week: 2 days    Minutes of Exercise per Session: 10 min  Stress: No Stress Concern Present (07/09/2023)   [employer] of Occupational Health - Occupational Stress Questionnaire    Feeling of Stress : Not at all  Social Connections: Moderately Integrated (07/09/2023)   Social Connection and Isolation Panel [NHANES]    Frequency of Communication with Friends and Family: More than three times a week    Frequency of Social Gatherings with Friends and Family: More than three times a week    Attends Religious Services: More than 4 times per year    Active Member of [employer] or Organizations: Yes    Attends Banker Meetings: More than 4 times per year    Marital Status: Widowed  Recent Concern:  Social Connections - Moderately Isolated (07/06/2023)   Social Connection and Isolation Panel [NHANES]    Frequency of Communication with Friends and Family: More than three times a week    Frequency of Social Gatherings with Friends and Family: More than three times a week    Attends Religious Services: More than 4 times per year    Active Member of [employer] or Organizations: No    Attends Banker Meetings: Never    Marital Status: Widowed  Intimate Partner Violence: Not At Risk (07/06/2023)   Humiliation, Afraid, Rape, and Kick questionnaire    Fear of Current or Ex-Partner: No    Emotionally Abused: No    Physically Abused: No    Sexually Abused: No    FAMILY HISTORY: Family History  Problem Relation Age of Onset   Colon cancer Brother 15       exposure to chemicals [place]   Hypertension Mother    Stroke  Mother    Kidney failure Father    Breast cancer Neg Hx    Ovarian cancer Neg Hx     ALLERGIES:  is allergic to sulfamethoxazole-trimethoprim.  MEDICATIONS:  Current Outpatient Medications  Medication Sig Dispense Refill   Cholecalciferol (VITAMIN D3) 2000 units capsule Take 2,000 Units by mouth daily.     clindamycin (CLINDAGEL) 1 % gel Apply topically 2 (two) times daily. 30 g 5   diclofenac sodium (VOLTAREN) 1 % GEL Apply 2 g topically 4 (four) times daily as needed (joint pain).  11   diphenoxylate-atropine (LOMOTIL) 2.5-0.025 MG tablet Take 1 tablet by mouth 4 (four) times daily as needed for diarrhea or loose stools. 120 tablet 0   ELIQUIS 2.5 MG TABS tablet TAKE 1 TABLET BY MOUTH TWICE  DAILY 200 tablet 2   fluticasone (FLONASE) 50 MCG/ACT nasal spray USE 1 SPRAY IN EACH NOSTRIL ONCE DAILY 16 g 3   gentamicin cream (GARAMYCIN) 0.1 % Apply to affected toe once daily. 30 g 1   ipratropium (ATROVENT) 0.03 % nasal spray USE 1 SPRAY IN BOTH NOSTRILS  TWICE DAILY 60 mL 2   ketoconazole (NIZORAL) 2 % cream Apply 1 Application topically daily. For up to 2 weeks. May repeat if need. 30 g 2   lidocaine-prilocaine (EMLA) cream Apply 1 application. topically as needed. 30 g 6   loperamide (IMODIUM) 2 MG capsule Take 1 capsule (2 mg total) by mouth See admin instructions. With onset of loose stool, take 4mg  followed by 2mg  every 2 hours,  Maximum: 16 mg/day 120 capsule 1   loratadine (CLARITIN) 10 MG tablet Take 10 mg by mouth daily.     magnesium chloride (SLOW-MAG) 64 MG TBEC SR tablet Take 1 tablet (64 mg total) by mouth 2 (two) times daily. 60 tablet 2   Multiple Vitamins-Minerals (ONE-A-DAY WOMENS 50 PLUS PO) Take 1 tablet by mouth daily.      nystatin (MYCOSTATIN/NYSTOP) powder APPLY 1 POWDER TOPICALLY THREE TIMES DAILY 45 g 0   potassium chloride SA (KLOR-CON M) 20 MEQ tablet TAKE 1 TABLET BY MOUTH DAILY 100 tablet 2   simvastatin (ZOCOR) 40 MG tablet Take 1 tablet (40 mg total) by mouth  at bedtime. 100 tablet 3   triamcinolone cream (KENALOG) 0.5 % Apply 1 Application topically 2 (two) times daily. To affected areas, for up to 2 weeks. 30 g 2   triamterene-hydrochlorothiazide (DYAZIDE) 37.5-25 MG capsule Take 1 each (1 capsule total) by mouth daily. 100 capsule 3   zinc gluconate 50 MG  tablet Take 50 mg by mouth daily.     No current facility-administered medications for this visit.   Facility-Administered Medications Ordered in Other Visits  Medication Dose Route Frequency Provider Last Rate Last Admin   fluorouracil (ADRUCIL) 4,350 mg in sodium chloride 0.9 % 63 mL chemo infusion  2,400 mg/m2 (Treatment Plan Recorded) Intravenous 1 day or 1 dose Jacqueline Patience, MD   4,350 mg at 08/27/23 1200   heparin lock flush 100 UNIT/ML injection              PHYSICAL EXAMINATION: ECOG PERFORMANCE STATUS: 1 - Symptomatic but completely ambulatory  Physical Exam Constitutional:      General: She is not in acute distress. HENT:     Head: Normocephalic and atraumatic.  Eyes:     General: No scleral icterus. Cardiovascular:     Rate and Rhythm: Normal rate.     Heart sounds: Normal heart sounds.  Pulmonary:     Effort: Pulmonary effort is normal. No respiratory distress.     Breath sounds: No wheezing.  Abdominal:     General: Bowel sounds are normal. There is no distension.     Palpations: Abdomen is soft.     Comments: + Colostomy bag   Musculoskeletal:        General: No deformity. Normal range of motion.     Cervical back: Normal range of motion and neck supple.  Skin:    General: Skin is warm and dry.  Neurological:     Mental Status: She is alert and oriented to person, place, and time. Mental status is at baseline.     Cranial Nerves: No cranial nerve deficit.     Coordination: Coordination normal.       LABORATORY DATA:  I have reviewed the data as listed    Latest Ref Rng & Units 08/27/2023    7:55 AM 08/14/2023   10:18 AM 08/06/2023    8:40 AM  CBC  WBC  4.0 - 10.5 K/uL 3.7  3.7  4.1   Hemoglobin 12.0 - 15.0 g/dL 16.1  09.6  04.5   Hematocrit 36.0 - 46.0 % 34.3  36.1  34.3   Platelets 150 - 400 K/uL 158  257  262       Latest Ref Rng & Units 08/27/2023    7:55 AM 08/14/2023   10:18 AM 08/06/2023    8:36 AM  CMP  Glucose 70 - 99 mg/dL 409  811  914   BUN 8 - 23 mg/dL 15  18  13    Creatinine 0.44 - 1.00 mg/dL 7.82  9.56  2.13   Sodium 135 - 145 mmol/L 138  138  138   Potassium 3.5 - 5.1 mmol/L 3.6  3.6  3.5   Chloride 98 - 111 mmol/L 102  104  101   CO2 22 - 32 mmol/L 29  27  27    Calcium 8.9 - 10.3 mg/dL 9.0  8.6  8.6   Total Protein 6.5 - 8.1 g/dL 6.5  6.5  6.5   Total Bilirubin <1.2 mg/dL 0.4  0.6  0.4   Alkaline Phos 38 - 126 U/L 61  68  60   AST 15 - 41 U/L 43  38  41   ALT 0 - 44 U/L 27  24  26       RADIOGRAPHIC STUDIES: I have personally reviewed the radiological images as listed and agreed with the findings in the report. NM PET Image Restag (PS) Skull  Base To Thigh  Result Date: 07/11/2023 CLINICAL DATA:  Subsequent treatment strategy for colorectal carcinoma. EXAM: NUCLEAR MEDICINE PET SKULL BASE TO THIGH TECHNIQUE: 8.66 mCi F-18 FDG was injected intravenously. Full-ring PET imaging was performed from the skull base to thigh after the radiotracer. CT data was obtained and used for attenuation correction and anatomic localization. Fasting blood glucose: 64 mg/dl COMPARISON:  CT chest, abdomen and pelvis 06/25/2023 and PET-CT from 09/19/2022 FINDINGS: Mediastinal blood pool activity: SUV max 2.37 Liver activity: SUV max NA NECK: No hypermetabolic lymph nodes in the neck. Incidental CT findings: None. CHEST: Left supraclavicular lymph node measures 0.6 cm within SUV max of 4.85, image 32/6. This is new when compared with the previous PET-CT. On 06/25/2023 this measured the same. Multifocal bilateral pulmonary nodules are identified compatible with pulmonary metastasis. -Index nodule within the right middle lobe measures 1.4 cm with  SUV max of 4.30 unchanged in size from 06/25/2023. On the PET-CT from 09/19/2022 this measured 1.2 cm and had an SUV max of 0.74. -Additional scattered lung nodules are identified throughout both lungs most of which are too small to characterize by PET-CT. The largest of these is in the anteromedial right middle lobe measuring 8 mm with SUV max of 1.22, image 37/2. Stable from 06/25/2023. On the PET-CT from 09/19/2022 this measured 8 mm with SUV max of 1.17. Incidental CT findings: No pleural effusion or consolidative change. Aortic atherosclerotic calcifications. ABDOMEN/PELVIS: No tracer avid liver metastases. There is no abnormal tracer uptake identified within the liver, pancreas, or spleen. Multifocal tracer avid abdominopelvic lymph nodes are identified. Tracer avid left periaortic lymph node measures 8 mm within SUV max of 8.36. This is new compared with the previous exam. Right common iliac lymph node measures 1 cm and has an SUV max of 6.6, image 107/6. This is also new compared with the prior exam. Right external iliac lymph node measures 7 mm within SUV max of 6.06. This is similar to the recent CT from 06/25/2023. On the previous PET-CT from 09/19/22 this measured 7 mm with SUV max of 7.7. More distally, there is a new tracer avid right external iliac lymph node which measures 1.4 cm within SUV max of 6.63. Right inguinal lymph node measures 1.1 cm within SUV max of 5.16. Formally this measured 0.9 cm with SUV max of 5.13. No tracer avid peritoneal nodule Incidental CT findings: Left lower quadrant descending colostomy. Aortic atherosclerosis. SKELETON: Diffuse increased radiotracer uptake throughout the marrow space is identified diminishing sensitivity for detecting osseous metastasis. No abnormal increased radiotracer uptake identified above background increased bone marrow activity to indicate metabolically active bone metastases. Incidental CT findings: None. IMPRESSION: 1. Multifocal tracer avid  abdominopelvic lymph nodes are identified compatible with metastatic disease. Although similar when compared with 06/25/2023, there are new tracer avid abdominopelvic lymph nodes identified when compared with the previous PET-CT from 09/19/2022. 2. Multifocal bilateral pulmonary nodules are identified compatible with pulmonary metastasis. The largest of these is in the right middle lobe measuring 1.4 cm with SUV max of 4.30. This is unchanged in size from 06/25/2023. On the PET-CT from 09/19/2022 this measured 1.2 cm and had an SUV max of 0.74. The remaining lung nodules are too small to characterize by PET-CT. 3. Diffuse increased radiotracer uptake throughout the marrow space is identified diminishing sensitivity for detecting osseous metastasis. No abnormal increased radiotracer uptake identified above background increased bone marrow activity to indicate metabolically active bone metastases. 4.  Aortic Atherosclerosis (ICD10-I70.0). Electronically Signed  By: Signa Kell M.D.   On: 07/11/2023 12:08   CT CHEST ABDOMEN PELVIS W CONTRAST  Result Date: 06/25/2023 CLINICAL DATA:  Follow-up metastatic rectal cancer. * Tracking Code: BO * EXAM: CT CHEST, ABDOMEN, AND PELVIS WITH CONTRAST TECHNIQUE: Multidetector CT imaging of the chest, abdomen and pelvis was performed following the standard protocol during bolus administration of intravenous contrast. RADIATION DOSE REDUCTION: This exam was performed according to the departmental dose-optimization program which includes automated exposure control, adjustment of the mA and/or kV according to patient size and/or use of iterative reconstruction technique. CONTRAST:  85mL OMNIPAQUE IOHEXOL 300 MG/ML  SOLN COMPARISON:  Multiple priors including most recent CT March 20, 2023. FINDINGS: CT CHEST FINDINGS Cardiovascular: Right chest Port-A-Cath with tip in the right atrium. Aortic atherosclerosis. Enlarged main pulmonary artery measuring 3.6 cm. Coronary artery  calcifications normal size heart. No significant pericardial effusion/thickening. Mediastinum/Nodes: No suspicious thyroid nodule. No pathologically enlarged mediastinal, hilar or axillary lymph nodes esophagus is grossly unremarkable Lungs/Pleura: Increased size of the solid bilateral pulmonary nodules. For reference: -medial right middle lobe pulmonary nodule measures 17 x 13 mm on image 102/3 previously 14 x 11 mm. No pleural effusion. No pneumothorax. Scattered atelectasis/scarring. Musculoskeletal: Thoracic spondylosis. CT ABDOMEN PELVIS FINDINGS Hepatobiliary: Stable 8 mm subcapsular lesion in the anterior inferior right hepatic lobe segment V on image 71/2 common non FDG avid on prior PET-CT stable over multiple prior examinations favored benign. No discrete suspicious hepatic lesion identified. Gallbladder surgically absent. Similar mild dilation of the biliary tree with the common duct measuring 10 mm but smoothly tapering to the level of the ampulla and favored reservoir effect post cholecystectomy Pancreas: No pancreatic ductal dilation or evidence of acute inflammation. Spleen: No splenomegaly.  Stable 11 mm splenic cyst on image 61/2 Adrenals/Urinary Tract: Bilateral adrenal glands appear normal. No hydronephrosis stable benign bilateral renal cysts, including a 2.1 cm lesion in the lateral interpolar left kidney which measures Hounsfield units slightly greater than that of simple fluid but was previously classifies as a cyst on MRI March 16, 2020, also stable are the hypodense renal lesions technically too small to accurately characterize but statistically likely to reflect cysts. These are considered benign and requiring no independent imaging follow-up. Urinary bladder is unremarkable for degree of distension Stomach/Bowel: Radiopaque enteric contrast material traverses the ileocecal valve. Stomach is unremarkable for degree of distension. No pathologic dilation of small or large bowel. Normal  appendix. Colonic diverticulosis without findings of acute diverticulitis Prior abdominoperineal resection with mid transverse end colostomy in the left anterior abdominal wall. Fat containing peristomal hernia. Vascular/Lymphatic: Aortic atherosclerosis. Smooth IVC contours. The portal, splenic and superior mesenteric veins are patent. Previously indexed right external iliac lymph node measures 11 mm in short axis on image 100/2 previously 6 mm. Previously indexed inguinal lymph node measures 14 mm in short axis on image 107/2 previously 11 mm. Reproductive: Status post hysterectomy and bilateral salpingo-oophorectomy. Other: No significant abdominopelvic free fluid. Fat containing peristomal hernia Musculoskeletal: No significant interval change in the left iliac bone osseous metastasis on image 83/2. Multilevel degenerative changes spine. Probable postradiation change in the sacrum IMPRESSION: 1. Increased size of the solid bilateral pulmonary nodules, consistent with worsening metastatic disease. 2. Increased size of the right external iliac and inguinal lymph nodes, concerning for worsening metastatic disease. 3. No significant interval change in the left iliac bone osseous metastasis. 4. Enlarged main pulmonary artery measuring 3.6 cm, which can be seen in the setting of pulmonary arterial hypertension. 5.  Aortic Atherosclerosis (ICD10-I70.0). Electronically Signed   By: Maudry Mayhew M.D.   On: 06/25/2023 11:35      NM PET Image Restag (PS) Skull Base To Thigh  Result Date: 07/11/2023 CLINICAL DATA:  Subsequent treatment strategy for colorectal carcinoma. EXAM: NUCLEAR MEDICINE PET SKULL BASE TO THIGH TECHNIQUE: 8.66 mCi F-18 FDG was injected intravenously. Full-ring PET imaging was performed from the skull base to thigh after the radiotracer. CT data was obtained and used for attenuation correction and anatomic localization. Fasting blood glucose: 64 mg/dl COMPARISON:  CT chest, abdomen and pelvis  06/25/2023 and PET-CT from 09/19/2022 FINDINGS: Mediastinal blood pool activity: SUV max 2.37 Liver activity: SUV max NA NECK: No hypermetabolic lymph nodes in the neck. Incidental CT findings: None. CHEST: Left supraclavicular lymph node measures 0.6 cm within SUV max of 4.85, image 32/6. This is new when compared with the previous PET-CT. On 06/25/2023 this measured the same. Multifocal bilateral pulmonary nodules are identified compatible with pulmonary metastasis. -Index nodule within the right middle lobe measures 1.4 cm with SUV max of 4.30 unchanged in size from 06/25/2023. On the PET-CT from 09/19/2022 this measured 1.2 cm and had an SUV max of 0.74. -Additional scattered lung nodules are identified throughout both lungs most of which are too small to characterize by PET-CT. The largest of these is in the anteromedial right middle lobe measuring 8 mm with SUV max of 1.22, image 37/2. Stable from 06/25/2023. On the PET-CT from 09/19/2022 this measured 8 mm with SUV max of 1.17. Incidental CT findings: No pleural effusion or consolidative change. Aortic atherosclerotic calcifications. ABDOMEN/PELVIS: No tracer avid liver metastases. There is no abnormal tracer uptake identified within the liver, pancreas, or spleen. Multifocal tracer avid abdominopelvic lymph nodes are identified. Tracer avid left periaortic lymph node measures 8 mm within SUV max of 8.36. This is new compared with the previous exam. Right common iliac lymph node measures 1 cm and has an SUV max of 6.6, image 107/6. This is also new compared with the prior exam. Right external iliac lymph node measures 7 mm within SUV max of 6.06. This is similar to the recent CT from 06/25/2023. On the previous PET-CT from 09/19/22 this measured 7 mm with SUV max of 7.7. More distally, there is a new tracer avid right external iliac lymph node which measures 1.4 cm within SUV max of 6.63. Right inguinal lymph node measures 1.1 cm within SUV max of 5.16.  Formally this measured 0.9 cm with SUV max of 5.13. No tracer avid peritoneal nodule Incidental CT findings: Left lower quadrant descending colostomy. Aortic atherosclerosis. SKELETON: Diffuse increased radiotracer uptake throughout the marrow space is identified diminishing sensitivity for detecting osseous metastasis. No abnormal increased radiotracer uptake identified above background increased bone marrow activity to indicate metabolically active bone metastases. Incidental CT findings: None. IMPRESSION: 1. Multifocal tracer avid abdominopelvic lymph nodes are identified compatible with metastatic disease. Although similar when compared with 06/25/2023, there are new tracer avid abdominopelvic lymph nodes identified when compared with the previous PET-CT from 09/19/2022. 2. Multifocal bilateral pulmonary nodules are identified compatible with pulmonary metastasis. The largest of these is in the right middle lobe measuring 1.4 cm with SUV max of 4.30. This is unchanged in size from 06/25/2023. On the PET-CT from 09/19/2022 this measured 1.2 cm and had an SUV max of 0.74. The remaining lung nodules are too small to characterize by PET-CT. 3. Diffuse increased radiotracer uptake throughout the marrow space is identified diminishing sensitivity for detecting  osseous metastasis. No abnormal increased radiotracer uptake identified above background increased bone marrow activity to indicate metabolically active bone metastases. 4.  Aortic Atherosclerosis (ICD10-I70.0). Electronically Signed   By: Signa Kell M.D.   On: 07/11/2023 12:08   CT CHEST ABDOMEN PELVIS W CONTRAST  Result Date: 06/25/2023 CLINICAL DATA:  Follow-up metastatic rectal cancer. * Tracking Code: BO * EXAM: CT CHEST, ABDOMEN, AND PELVIS WITH CONTRAST TECHNIQUE: Multidetector CT imaging of the chest, abdomen and pelvis was performed following the standard protocol during bolus administration of intravenous contrast. RADIATION DOSE REDUCTION: This  exam was performed according to the departmental dose-optimization program which includes automated exposure control, adjustment of the mA and/or kV according to patient size and/or use of iterative reconstruction technique. CONTRAST:  85mL OMNIPAQUE IOHEXOL 300 MG/ML  SOLN COMPARISON:  Multiple priors including most recent CT March 20, 2023. FINDINGS: CT CHEST FINDINGS Cardiovascular: Right chest Port-A-Cath with tip in the right atrium. Aortic atherosclerosis. Enlarged main pulmonary artery measuring 3.6 cm. Coronary artery calcifications normal size heart. No significant pericardial effusion/thickening. Mediastinum/Nodes: No suspicious thyroid nodule. No pathologically enlarged mediastinal, hilar or axillary lymph nodes esophagus is grossly unremarkable Lungs/Pleura: Increased size of the solid bilateral pulmonary nodules. For reference: -medial right middle lobe pulmonary nodule measures 17 x 13 mm on image 102/3 previously 14 x 11 mm. No pleural effusion. No pneumothorax. Scattered atelectasis/scarring. Musculoskeletal: Thoracic spondylosis. CT ABDOMEN PELVIS FINDINGS Hepatobiliary: Stable 8 mm subcapsular lesion in the anterior inferior right hepatic lobe segment V on image 71/2 common non FDG avid on prior PET-CT stable over multiple prior examinations favored benign. No discrete suspicious hepatic lesion identified. Gallbladder surgically absent. Similar mild dilation of the biliary tree with the common duct measuring 10 mm but smoothly tapering to the level of the ampulla and favored reservoir effect post cholecystectomy Pancreas: No pancreatic ductal dilation or evidence of acute inflammation. Spleen: No splenomegaly.  Stable 11 mm splenic cyst on image 61/2 Adrenals/Urinary Tract: Bilateral adrenal glands appear normal. No hydronephrosis stable benign bilateral renal cysts, including a 2.1 cm lesion in the lateral interpolar left kidney which measures Hounsfield units slightly greater than that of simple  fluid but was previously classifies as a cyst on MRI March 16, 2020, also stable are the hypodense renal lesions technically too small to accurately characterize but statistically likely to reflect cysts. These are considered benign and requiring no independent imaging follow-up. Urinary bladder is unremarkable for degree of distension Stomach/Bowel: Radiopaque enteric contrast material traverses the ileocecal valve. Stomach is unremarkable for degree of distension. No pathologic dilation of small or large bowel. Normal appendix. Colonic diverticulosis without findings of acute diverticulitis Prior abdominoperineal resection with mid transverse end colostomy in the left anterior abdominal wall. Fat containing peristomal hernia. Vascular/Lymphatic: Aortic atherosclerosis. Smooth IVC contours. The portal, splenic and superior mesenteric veins are patent. Previously indexed right external iliac lymph node measures 11 mm in short axis on image 100/2 previously 6 mm. Previously indexed inguinal lymph node measures 14 mm in short axis on image 107/2 previously 11 mm. Reproductive: Status post hysterectomy and bilateral salpingo-oophorectomy. Other: No significant abdominopelvic free fluid. Fat containing peristomal hernia Musculoskeletal: No significant interval change in the left iliac bone osseous metastasis on image 83/2. Multilevel degenerative changes spine. Probable postradiation change in the sacrum IMPRESSION: 1. Increased size of the solid bilateral pulmonary nodules, consistent with worsening metastatic disease. 2. Increased size of the right external iliac and inguinal lymph nodes, concerning for worsening metastatic disease. 3. No significant  interval change in the left iliac bone osseous metastasis. 4. Enlarged main pulmonary artery measuring 3.6 cm, which can be seen in the setting of pulmonary arterial hypertension. 5.  Aortic Atherosclerosis (ICD10-I70.0). Electronically Signed   By: Maudry Mayhew M.D.    On: 06/25/2023 11:35

## 2023-08-27 NOTE — Assessment & Plan Note (Signed)
Chemotherapy plan as listed above 

## 2023-08-27 NOTE — Patient Instructions (Signed)
 [facility] - A DEPT OF [facility]  Discharge Instructions: Thank you for choosing [facility] to provide your oncology and hematology care.  If you have a lab appointment with the Cancer Center, please go directly to the Cancer Center and check in at the registration area.  Wear comfortable clothing and clothing appropriate for easy access to any Portacath or PICC line.   We strive to give you quality time with your provider. You may need to reschedule your appointment if you arrive late (15 or more minutes).  Arriving late affects you and other patients whose appointments are after yours.  Also, if you miss three or more appointments without notifying the office, you may be dismissed from the clinic at the provider's discretion.      For prescription refill requests, have your pharmacy contact our office and allow 72 hours for refills to be completed.    To help prevent nausea and vomiting after your treatment, we encourage you to take your nausea medication as directed.  BELOW ARE SYMPTOMS THAT SHOULD BE REPORTED IMMEDIATELY: *FEVER GREATER THAN 100.4 F (38 C) OR HIGHER *CHILLS OR SWEATING *NAUSEA AND VOMITING THAT IS NOT CONTROLLED WITH YOUR NAUSEA MEDICATION *UNUSUAL SHORTNESS OF BREATH *UNUSUAL BRUISING OR BLEEDING *URINARY PROBLEMS (pain or burning when urinating, or frequent urination) *BOWEL PROBLEMS (unusual diarrhea, constipation, pain near the anus) TENDERNESS IN MOUTH AND THROAT WITH OR WITHOUT PRESENCE OF ULCERS (sore throat, sores in mouth, or a toothache) UNUSUAL RASH, SWELLING OR PAIN  UNUSUAL VAGINAL DISCHARGE OR ITCHING   Items with * indicate a potential emergency and should be followed up as soon as possible or go to the Emergency Department if any problems should occur.  Please show the CHEMOTHERAPY ALERT CARD or IMMUNOTHERAPY ALERT CARD at check-in to the Emergency Department and triage nurse.  Should you have  questions after your visit or need to cancel or reschedule your appointment, please contact [facility] - A DEPT OF [facility]  [phone] and follow the prompts.  Office hours are 8:00 a.m. to 4:30 p.m. Monday - Friday. Please note that voicemails left after 4:00 p.m. may not be returned until the following business day.  We are closed weekends and major holidays. You have access to a nurse at all times for urgent questions. Please call the main number to the clinic [phone] and follow the prompts.  For any non-urgent questions, you may also contact your provider using MyChart. We now offer e-Visits for anyone 57 and older to request care online for non-urgent symptoms. For details visit [email or web address].   Also download the MyChart app! Go to the app store, search "MyChart", open the app, select [place], and log in with your MyChart username and password.

## 2023-08-27 NOTE — Assessment & Plan Note (Signed)
Hb stable, observation

## 2023-08-27 NOTE — Assessment & Plan Note (Signed)
 continue lomotil  QID PRN as instructed.

## 2023-08-27 NOTE — Assessment & Plan Note (Signed)
Continue potassium chloride 20 mEq daily.

## 2023-08-27 NOTE — Assessment & Plan Note (Signed)
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  Intermittent vaginal spotting.  Vulvar mass location is difficult for biopsy in the clinic per gynonc.  Dr. Rushie Chestnut will not offer radiation,

## 2023-08-27 NOTE — Assessment & Plan Note (Signed)
History of PE and bilateral lower extremity DVT  05/18/23 Repeat right LE US showed chronic non occlusive DVT Continue  Eliquis 2.5 mg twice daily for anticoagulation prophylaxis. Recommend leg elevation and compression stocking.

## 2023-08-27 NOTE — Assessment & Plan Note (Signed)
History of stage IIIC Rectal cancer, s/p TNT, followed by 09/17/19 APR/posterior vaginectomy/TAH/BSO/VY-flap, pT4b pN0 with close vaginal margin 0.2 mm.  Uterus and ovaries negative for malignancy. palliative radiation to vaginal recurrence- 01/19/21 recurrence with lung metastasis.-Palliative -FOLFIRI plus bevacizumab.  Irinotecan was dropped in November 2022 due to side effects. Negative for UGT1A1*28 - radiographically stable, rise of CEA-July 2023 CT lung metastasis worse--> Dec 2023 PET showed progression in pelvic lymph nodes and bone lesions,--> 2nd line irinotecan +panitumumab-->CT March 2024 stable. --> June 2024 CT showed mild lung progression/Progressive left iliac bone metastasis, vulvar mass difficult for biopsy in office,  Tempus Liquid biopsy - no actionable variants --> 06/25/2023 CT showed progression of lung nodules [SLD increase 60mm] and iliac inguinal lymphadenopathy--> 07/23/23 switch to dose reduced FOLFOX   Labs are reviewed and discussed with patient, CEA nadir was in 490, currently gradually increasing Proceed with dose reduced FOLFOX  Plan to repeat CT after 4 cycles of FOLFOX

## 2023-08-28 ENCOUNTER — Ambulatory Visit (INDEPENDENT_AMBULATORY_CARE_PROVIDER_SITE_OTHER): Payer: 59 | Admitting: Podiatry

## 2023-08-28 ENCOUNTER — Encounter: Payer: Self-pay | Admitting: Podiatry

## 2023-08-28 VITALS — Ht 64.0 in | Wt 160.6 lb

## 2023-08-28 DIAGNOSIS — L600 Ingrowing nail: Secondary | ICD-10-CM | POA: Diagnosis not present

## 2023-08-28 LAB — CEA: CEA: 2536 ng/mL — ABNORMAL HIGH (ref 0.0–4.7)

## 2023-08-28 NOTE — Progress Notes (Signed)
Subjective: Jacqueline Yoder is a 77 y.o.  female returns to office today for follow up evaluation after having right Hallux lateral border nail avulsion performed. Patient has been soaking using epsom salt and applying topical antibiotic covered with bandaid daily. Patient denies fevers, chills, nausea, vomiting. Denies any calf pain, chest pain, SOB.   Objective:  Vitals: Reviewed  General: Well developed, nourished, in no acute distress, alert and oriented x3   Dermatology: Skin is warm, dry and supple bilateral. Lateral hallux nail border appears to be clean, dry, with mild granular tissue and surrounding scab. There is no surrounding erythema, edema, drainage/purulence. The remaining nails appear unremarkable at this time. There are no other lesions or other signs of infection present.  Neurovascular status: Intact. No lower extremity swelling; No pain with calf compression bilateral.  Musculoskeletal: Decreased tenderness to palpation of the Lateral hallux nail fold(s). Muscular strength within normal limits bilateral.   Assesement and Plan: S/p partial nail avulsion, doing well.   -disContinue soaking in epsom salts twice a day followed by antibiotic ointment and a band-aid as needed. Can leave uncovered at night. .  -If the area does not heal properply, call the office for follow-up appointment, or sooner if any problems arise.  -Monitor for any signs/symptoms of infection. Call the office immediately if any occur or go directly to the emergency room. Call with any questions/concerns.  Nicholes Rough, DPM

## 2023-08-29 ENCOUNTER — Inpatient Hospital Stay: Payer: 59

## 2023-08-29 VITALS — BP 145/64 | HR 78 | Temp 97.5°F | Resp 18

## 2023-08-29 DIAGNOSIS — R600 Localized edema: Secondary | ICD-10-CM | POA: Diagnosis not present

## 2023-08-29 DIAGNOSIS — C20 Malignant neoplasm of rectum: Secondary | ICD-10-CM

## 2023-08-29 DIAGNOSIS — Z7901 Long term (current) use of anticoagulants: Secondary | ICD-10-CM | POA: Diagnosis not present

## 2023-08-29 DIAGNOSIS — R634 Abnormal weight loss: Secondary | ICD-10-CM | POA: Diagnosis not present

## 2023-08-29 DIAGNOSIS — Z9221 Personal history of antineoplastic chemotherapy: Secondary | ICD-10-CM | POA: Diagnosis not present

## 2023-08-29 DIAGNOSIS — Z87891 Personal history of nicotine dependence: Secondary | ICD-10-CM | POA: Diagnosis not present

## 2023-08-29 DIAGNOSIS — C7951 Secondary malignant neoplasm of bone: Secondary | ICD-10-CM | POA: Diagnosis not present

## 2023-08-29 DIAGNOSIS — Z5111 Encounter for antineoplastic chemotherapy: Secondary | ICD-10-CM | POA: Diagnosis not present

## 2023-08-29 DIAGNOSIS — D6481 Anemia due to antineoplastic chemotherapy: Secondary | ICD-10-CM | POA: Diagnosis not present

## 2023-08-29 DIAGNOSIS — Z86711 Personal history of pulmonary embolism: Secondary | ICD-10-CM | POA: Diagnosis not present

## 2023-08-29 DIAGNOSIS — Z79899 Other long term (current) drug therapy: Secondary | ICD-10-CM | POA: Diagnosis not present

## 2023-08-29 DIAGNOSIS — K219 Gastro-esophageal reflux disease without esophagitis: Secondary | ICD-10-CM | POA: Diagnosis not present

## 2023-08-29 DIAGNOSIS — B372 Candidiasis of skin and nail: Secondary | ICD-10-CM | POA: Diagnosis not present

## 2023-08-29 DIAGNOSIS — I2510 Atherosclerotic heart disease of native coronary artery without angina pectoris: Secondary | ICD-10-CM | POA: Diagnosis not present

## 2023-08-29 DIAGNOSIS — Z933 Colostomy status: Secondary | ICD-10-CM | POA: Diagnosis not present

## 2023-08-29 DIAGNOSIS — E7800 Pure hypercholesterolemia, unspecified: Secondary | ICD-10-CM | POA: Diagnosis not present

## 2023-08-29 DIAGNOSIS — L89152 Pressure ulcer of sacral region, stage 2: Secondary | ICD-10-CM | POA: Diagnosis not present

## 2023-08-29 DIAGNOSIS — Z556 Problems related to health literacy: Secondary | ICD-10-CM | POA: Diagnosis not present

## 2023-08-29 DIAGNOSIS — Z800 Family history of malignant neoplasm of digestive organs: Secondary | ICD-10-CM | POA: Diagnosis not present

## 2023-08-29 DIAGNOSIS — C799 Secondary malignant neoplasm of unspecified site: Secondary | ICD-10-CM

## 2023-08-29 DIAGNOSIS — K5730 Diverticulosis of large intestine without perforation or abscess without bleeding: Secondary | ICD-10-CM | POA: Diagnosis not present

## 2023-08-29 DIAGNOSIS — I700 Atherosclerosis of aorta: Secondary | ICD-10-CM | POA: Diagnosis not present

## 2023-08-29 DIAGNOSIS — I10 Essential (primary) hypertension: Secondary | ICD-10-CM | POA: Diagnosis not present

## 2023-08-29 DIAGNOSIS — E876 Hypokalemia: Secondary | ICD-10-CM | POA: Diagnosis not present

## 2023-08-29 MED ORDER — SODIUM CHLORIDE 0.9% FLUSH
10.0000 mL | INTRAVENOUS | Status: DC | PRN
Start: 2023-08-29 — End: 2023-08-29
  Administered 2023-08-29: 10 mL
  Filled 2023-08-29: qty 10

## 2023-08-29 MED ORDER — HEPARIN SOD (PORK) LOCK FLUSH 100 UNIT/ML IV SOLN
500.0000 [IU] | Freq: Once | INTRAVENOUS | Status: AC | PRN
  Administered 2023-08-29: 500 [IU]
  Filled 2023-08-29: qty 5

## 2023-08-31 DIAGNOSIS — B372 Candidiasis of skin and nail: Secondary | ICD-10-CM | POA: Diagnosis not present

## 2023-08-31 DIAGNOSIS — L89152 Pressure ulcer of sacral region, stage 2: Secondary | ICD-10-CM | POA: Diagnosis not present

## 2023-08-31 DIAGNOSIS — Z79899 Other long term (current) drug therapy: Secondary | ICD-10-CM | POA: Diagnosis not present

## 2023-08-31 DIAGNOSIS — Z7901 Long term (current) use of anticoagulants: Secondary | ICD-10-CM | POA: Diagnosis not present

## 2023-08-31 DIAGNOSIS — Z556 Problems related to health literacy: Secondary | ICD-10-CM | POA: Diagnosis not present

## 2023-08-31 DIAGNOSIS — Z87891 Personal history of nicotine dependence: Secondary | ICD-10-CM | POA: Diagnosis not present

## 2023-08-31 DIAGNOSIS — I10 Essential (primary) hypertension: Secondary | ICD-10-CM | POA: Diagnosis not present

## 2023-09-03 DIAGNOSIS — L89152 Pressure ulcer of sacral region, stage 2: Secondary | ICD-10-CM | POA: Diagnosis not present

## 2023-09-03 DIAGNOSIS — Z79899 Other long term (current) drug therapy: Secondary | ICD-10-CM | POA: Diagnosis not present

## 2023-09-03 DIAGNOSIS — I10 Essential (primary) hypertension: Secondary | ICD-10-CM | POA: Diagnosis not present

## 2023-09-03 DIAGNOSIS — Z7901 Long term (current) use of anticoagulants: Secondary | ICD-10-CM | POA: Diagnosis not present

## 2023-09-03 DIAGNOSIS — Z556 Problems related to health literacy: Secondary | ICD-10-CM | POA: Diagnosis not present

## 2023-09-03 DIAGNOSIS — Z87891 Personal history of nicotine dependence: Secondary | ICD-10-CM | POA: Diagnosis not present

## 2023-09-03 DIAGNOSIS — B372 Candidiasis of skin and nail: Secondary | ICD-10-CM | POA: Diagnosis not present

## 2023-09-06 DIAGNOSIS — Z7901 Long term (current) use of anticoagulants: Secondary | ICD-10-CM | POA: Diagnosis not present

## 2023-09-06 DIAGNOSIS — Z87891 Personal history of nicotine dependence: Secondary | ICD-10-CM | POA: Diagnosis not present

## 2023-09-06 DIAGNOSIS — I10 Essential (primary) hypertension: Secondary | ICD-10-CM | POA: Diagnosis not present

## 2023-09-06 DIAGNOSIS — B372 Candidiasis of skin and nail: Secondary | ICD-10-CM | POA: Diagnosis not present

## 2023-09-06 DIAGNOSIS — Z556 Problems related to health literacy: Secondary | ICD-10-CM | POA: Diagnosis not present

## 2023-09-06 DIAGNOSIS — Z79899 Other long term (current) drug therapy: Secondary | ICD-10-CM | POA: Diagnosis not present

## 2023-09-06 DIAGNOSIS — L89152 Pressure ulcer of sacral region, stage 2: Secondary | ICD-10-CM | POA: Diagnosis not present

## 2023-09-10 ENCOUNTER — Encounter: Payer: Self-pay | Admitting: Oncology

## 2023-09-10 ENCOUNTER — Inpatient Hospital Stay (HOSPITAL_BASED_OUTPATIENT_CLINIC_OR_DEPARTMENT_OTHER): Payer: 59 | Admitting: Oncology

## 2023-09-10 ENCOUNTER — Inpatient Hospital Stay: Payer: 59

## 2023-09-10 ENCOUNTER — Other Ambulatory Visit: Payer: Self-pay | Admitting: Oncology

## 2023-09-10 VITALS — BP 123/64 | HR 69 | Temp 97.7°F | Resp 18 | Wt 164.1 lb

## 2023-09-10 VITALS — BP 143/70 | HR 69 | Temp 98.2°F | Resp 17 | Ht 64.0 in | Wt 164.1 lb

## 2023-09-10 DIAGNOSIS — K521 Toxic gastroenteritis and colitis: Secondary | ICD-10-CM | POA: Diagnosis not present

## 2023-09-10 DIAGNOSIS — T451X5A Adverse effect of antineoplastic and immunosuppressive drugs, initial encounter: Secondary | ICD-10-CM

## 2023-09-10 DIAGNOSIS — C799 Secondary malignant neoplasm of unspecified site: Secondary | ICD-10-CM

## 2023-09-10 DIAGNOSIS — C7951 Secondary malignant neoplasm of bone: Secondary | ICD-10-CM | POA: Diagnosis not present

## 2023-09-10 DIAGNOSIS — E876 Hypokalemia: Secondary | ICD-10-CM | POA: Diagnosis not present

## 2023-09-10 DIAGNOSIS — Z79899 Other long term (current) drug therapy: Secondary | ICD-10-CM | POA: Diagnosis not present

## 2023-09-10 DIAGNOSIS — E7800 Pure hypercholesterolemia, unspecified: Secondary | ICD-10-CM | POA: Diagnosis not present

## 2023-09-10 DIAGNOSIS — D6481 Anemia due to antineoplastic chemotherapy: Secondary | ICD-10-CM | POA: Diagnosis not present

## 2023-09-10 DIAGNOSIS — Z5111 Encounter for antineoplastic chemotherapy: Secondary | ICD-10-CM

## 2023-09-10 DIAGNOSIS — R600 Localized edema: Secondary | ICD-10-CM | POA: Diagnosis not present

## 2023-09-10 DIAGNOSIS — N939 Abnormal uterine and vaginal bleeding, unspecified: Secondary | ICD-10-CM

## 2023-09-10 DIAGNOSIS — C20 Malignant neoplasm of rectum: Secondary | ICD-10-CM | POA: Diagnosis not present

## 2023-09-10 DIAGNOSIS — Z933 Colostomy status: Secondary | ICD-10-CM | POA: Diagnosis not present

## 2023-09-10 DIAGNOSIS — I2510 Atherosclerotic heart disease of native coronary artery without angina pectoris: Secondary | ICD-10-CM | POA: Diagnosis not present

## 2023-09-10 DIAGNOSIS — I10 Essential (primary) hypertension: Secondary | ICD-10-CM | POA: Diagnosis not present

## 2023-09-10 DIAGNOSIS — Z9221 Personal history of antineoplastic chemotherapy: Secondary | ICD-10-CM | POA: Diagnosis not present

## 2023-09-10 DIAGNOSIS — I700 Atherosclerosis of aorta: Secondary | ICD-10-CM | POA: Diagnosis not present

## 2023-09-10 DIAGNOSIS — K5730 Diverticulosis of large intestine without perforation or abscess without bleeding: Secondary | ICD-10-CM | POA: Diagnosis not present

## 2023-09-10 DIAGNOSIS — Z87891 Personal history of nicotine dependence: Secondary | ICD-10-CM | POA: Diagnosis not present

## 2023-09-10 DIAGNOSIS — Z86711 Personal history of pulmonary embolism: Secondary | ICD-10-CM | POA: Diagnosis not present

## 2023-09-10 DIAGNOSIS — R634 Abnormal weight loss: Secondary | ICD-10-CM | POA: Diagnosis not present

## 2023-09-10 DIAGNOSIS — Z800 Family history of malignant neoplasm of digestive organs: Secondary | ICD-10-CM | POA: Diagnosis not present

## 2023-09-10 DIAGNOSIS — K219 Gastro-esophageal reflux disease without esophagitis: Secondary | ICD-10-CM | POA: Diagnosis not present

## 2023-09-10 LAB — CBC WITH DIFFERENTIAL (CANCER CENTER ONLY)
Abs Immature Granulocytes: 0 10*3/uL (ref 0.00–0.07)
Basophils Absolute: 0 10*3/uL (ref 0.0–0.1)
Basophils Relative: 1 %
Eosinophils Absolute: 0.1 10*3/uL (ref 0.0–0.5)
Eosinophils Relative: 4 %
HCT: 33.9 % — ABNORMAL LOW (ref 36.0–46.0)
Hemoglobin: 10.7 g/dL — ABNORMAL LOW (ref 12.0–15.0)
Immature Granulocytes: 0 %
Lymphocytes Relative: 35 %
Lymphs Abs: 0.9 10*3/uL (ref 0.7–4.0)
MCH: 29.8 pg (ref 26.0–34.0)
MCHC: 31.6 g/dL (ref 30.0–36.0)
MCV: 94.4 fL (ref 80.0–100.0)
Monocytes Absolute: 0.4 10*3/uL (ref 0.1–1.0)
Monocytes Relative: 16 %
Neutro Abs: 1.2 10*3/uL — ABNORMAL LOW (ref 1.7–7.7)
Neutrophils Relative %: 44 %
Platelet Count: 170 10*3/uL (ref 150–400)
RBC: 3.59 MIL/uL — ABNORMAL LOW (ref 3.87–5.11)
RDW: 15.9 % — ABNORMAL HIGH (ref 11.5–15.5)
WBC Count: 2.7 10*3/uL — ABNORMAL LOW (ref 4.0–10.5)
nRBC: 0 % (ref 0.0–0.2)

## 2023-09-10 LAB — CMP (CANCER CENTER ONLY)
ALT: 22 U/L (ref 0–44)
AST: 41 U/L (ref 15–41)
Albumin: 3.5 g/dL (ref 3.5–5.0)
Alkaline Phosphatase: 55 U/L (ref 38–126)
Anion gap: 8 (ref 5–15)
BUN: 17 mg/dL (ref 8–23)
CO2: 29 mmol/L (ref 22–32)
Calcium: 9.1 mg/dL (ref 8.9–10.3)
Chloride: 103 mmol/L (ref 98–111)
Creatinine: 0.79 mg/dL (ref 0.44–1.00)
GFR, Estimated: >60 mL/min (ref >60)
Glucose, Bld: 98 mg/dL (ref 70–99)
Potassium: 4 mmol/L (ref 3.5–5.1)
Sodium: 140 mmol/L (ref 135–145)
Total Bilirubin: 0.7 mg/dL (ref <1.2)
Total Protein: 6.6 g/dL (ref 6.5–8.1)

## 2023-09-10 LAB — MAGNESIUM: Magnesium: 1.8 mg/dL (ref 1.7–2.4)

## 2023-09-10 MED ORDER — SODIUM CHLORIDE 0.9 % IV SOLN
INTRAVENOUS | Status: DC
Start: 2023-09-10 — End: 2023-09-10
  Filled 2023-09-10 (×2): qty 250

## 2023-09-10 MED ORDER — DEXTROSE 5 % IV SOLN
Freq: Once | INTRAVENOUS | Status: AC
Start: 2023-09-10 — End: 2023-09-10
  Filled 2023-09-10: qty 250

## 2023-09-10 MED ORDER — OXALIPLATIN CHEMO INJECTION 100 MG/20ML
70.0000 mg/m2 | Freq: Once | INTRAVENOUS | Status: AC
  Administered 2023-09-10: 125 mg via INTRAVENOUS
  Filled 2023-09-10: qty 25

## 2023-09-10 MED ORDER — LEUCOVORIN CALCIUM INJECTION 350 MG
400.0000 mg/m2 | Freq: Once | INTRAVENOUS | Status: AC
  Administered 2023-09-10: 724 mg via INTRAVENOUS
  Filled 2023-09-10: qty 36.2

## 2023-09-10 MED ORDER — PALONOSETRON HCL INJECTION 0.25 MG/5ML
0.2500 mg | Freq: Once | INTRAVENOUS | Status: AC
  Administered 2023-09-10: 0.25 mg via INTRAVENOUS
  Filled 2023-09-10: qty 5

## 2023-09-10 MED ORDER — DEXAMETHASONE SODIUM PHOSPHATE 10 MG/ML IJ SOLN
10.0000 mg | Freq: Once | INTRAMUSCULAR | Status: AC
  Administered 2023-09-10: 10 mg via INTRAVENOUS
  Filled 2023-09-10: qty 1

## 2023-09-10 MED ORDER — ACETAMINOPHEN 325 MG PO TABS
650.0000 mg | ORAL_TABLET | Freq: Once | ORAL | Status: AC
  Administered 2023-09-10: 650 mg via ORAL
  Filled 2023-09-10: qty 2

## 2023-09-10 MED ORDER — DIPHENHYDRAMINE HCL 25 MG PO CAPS
25.0000 mg | ORAL_CAPSULE | Freq: Once | ORAL | Status: AC
  Administered 2023-09-10: 25 mg via ORAL
  Filled 2023-09-10: qty 1

## 2023-09-10 MED ORDER — SODIUM CHLORIDE 0.9 % IV SOLN
2400.0000 mg/m2 | INTRAVENOUS | Status: DC
  Administered 2023-09-10: 4350 mg via INTRAVENOUS
  Filled 2023-09-10: qty 87

## 2023-09-10 NOTE — Assessment & Plan Note (Signed)
Continue potassium chloride 20 mEq daily.

## 2023-09-10 NOTE — Assessment & Plan Note (Signed)
Chemotherapy plan as listed above 

## 2023-09-10 NOTE — Assessment & Plan Note (Signed)
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  Intermittent vaginal spotting.  Vulvar mass is not able to be biopsied per gynonc.  Dr. Rushie Chestnut will not offer radiation,

## 2023-09-10 NOTE — Assessment & Plan Note (Addendum)
History of stage IIIC Rectal cancer, s/p TNT, followed by 09/17/19 APR/posterior vaginectomy/TAH/BSO/VY-flap, pT4b pN0 with close vaginal margin 0.2 mm.  Uterus and ovaries negative for malignancy. palliative radiation to vaginal recurrence- 01/19/21 recurrence with lung metastasis.-Palliative -FOLFIRI plus bevacizumab.  Irinotecan was dropped in November 2022 due to side effects. Negative for UGT1A1*28 - radiographically stable, rise of CEA-July 2023 CT lung metastasis worse--> Dec 2023 PET showed progression in pelvic lymph nodes and bone lesions,--> 2nd line irinotecan +panitumumab-->CT March 2024 stable. --> June 2024 CT showed mild lung progression/Progressive left iliac bone metastasis, vulvar mass difficult for biopsy in office,  [facility] Liquid biopsy - no actionable variants --> 06/25/2023 CT showed progression of lung nodules [SLD increase 39mm] and iliac inguinal lymphadenopathy--> 07/23/23 switch to dose reduced FOLFOX   Labs are reviewed and discussed with patient, CEA nadir was in 490, currently gradually increasing Proceed with dose reduced FOLFOX  repeat CT after 4 cycles of FOLFOX

## 2023-09-10 NOTE — Assessment & Plan Note (Signed)
continue lomotil  QID PRN as instructed.

## 2023-09-10 NOTE — Assessment & Plan Note (Signed)
If magnesium is 1.0 - 1.2:  administrate 6 gm IV magnesium sulfate If magnesium is 1.2-1.4, administrate 4 gm IV magnesium sulfate If magnesium is 1.5-1.6:  administrate 2 gm IV magnesium sulfate.  Continue slow mag orally 64mg  twice daily.

## 2023-09-10 NOTE — Progress Notes (Signed)
Hematology/Oncology Progress note [phone] [phone]      CHIEF COMPLAINTS/REASON FOR VISIT:  Follow up for rectal cancer  ASSESSMENT & PLAN:   Cancer Staging  Rectal cancer [facility]) Staging form: Colon and Rectum, AJCC 8th Edition - Pathologic stage from 10/06/2019: Stage IIC (ypT4b, pN0, cM0) - Signed by [name], MD on 10/06/2019 - Clinical stage from 06/30/2020: [name] - Signed by [name], MD on 04/17/2022 - Pathologic: Stage Unknown (rpTX, pNX, cM1) - Signed by [name], MD on 01/31/2021   Rectal cancer [facility]) History of stage IIIC Rectal cancer, s/p TNT, followed by 09/17/19 APR/posterior vaginectomy/TAH/BSO/VY-flap, pT4b pN0 with close vaginal margin 0.2 mm.  Uterus and ovaries negative for malignancy. palliative radiation to vaginal recurrence- 01/19/21 recurrence with lung metastasis.-Palliative -FOLFIRI plus bevacizumab.  Irinotecan was dropped in November 2022 due to side effects. Negative for UGT1A1*28 - radiographically stable, rise of CEA-July 2023 CT lung metastasis worse--> Dec 2023 PET showed progression in pelvic lymph nodes and bone lesions,--> 2nd line irinotecan +panitumumab-->CT March 2024 stable. --> June 2024 CT showed mild lung progression/Progressive left iliac bone metastasis, vulvar mass difficult for biopsy in office,  Tempus Liquid biopsy - no actionable variants --> 06/25/2023 CT showed progression of lung nodules [SLD increase 64mm] and iliac inguinal lymphadenopathy--> 07/23/23 switch to dose reduced FOLFOX   Labs are reviewed and discussed with patient, CEA nadir was in 490, currently gradually increasing Proceed with dose reduced FOLFOX  repeat CT after 4 cycles of FOLFOX  Encounter for antineoplastic chemotherapy Chemotherapy plan as listed above.   Anemia due to antineoplastic chemotherapy Hb stable, observation  Vaginal spotting likely recurrent rectal cancer involving vagina.  Intermittent vaginal spotting.  Vulvar mass is  not able to be biopsied per gynonc.  Dr. Rushie Chestnut will not offer radiation,   History of pulmonary embolism History of PE and bilateral lower extremity DVT  05/18/23 Repeat right LE US showed chronic non occlusive DVT Continue  Eliquis 2.5 mg twice daily for anticoagulation prophylaxis. Recommend leg elevation and compression stocking.   Chemotherapy induced diarrhea continue lomotil  QID PRN as instructed.   Hypokalemia Continue potassium chloride 20 mEq daily.    Hypomagnesemia If magnesium is 1.0 - 1.2:  administrate 6 gm IV magnesium sulfate If magnesium is 1.2-1.4, administrate 4 gm IV magnesium sulfate If magnesium is 1.5-1.6:  administrate 2 gm IV magnesium sulfate.  Continue slow mag orally 64mg  twice daily.    Follow up 2 weeks.  All questions were answered. The patient knows to call the clinic with any problems, questions or concerns.  [name], MD, PhD [facility] Hematology Oncology 09/10/2023      HISTORY OF PRESENTING ILLNESS:  Oncology History  Rectal cancer (HCC)  01/23/2019 Initial Diagnosis   Rectal cancer [facility]) Patient initially presented with complaints of postmenopausal bleeding on 08/16/2018.  History of was menopausal vaginal bleeding in 2016 which resulted in cervical polypectomy.  Pathology 02/04/2015 showed cervical polyp, consistent with benign endometrial polyp.  [name] after polypectomy due to anxiety associated with pelvic exams.  pelvic exam on 08/16/2018 reviewed cervical abnormality and from enlarged uterus. Seen by Dr. [name] on 10/29/2018.  Endometrial biopsy and a Pap smear was performed. 10/29/2018 Pap smear showed adenocarcinoma, favor endometrial origin. 10/29/2018 endometrial biopsy showed endometrioid carcinoma, FIGO grade 1.  10/29/2018- TA & TV Ultrasound revealed: Anteverted uterus measuring 8.7 x 5.6 x 6.4 cm without evidence of focal masses.  The endometrium measuring 24.1 mm (thickened) and  heterogeneous.  Right and left  ovaries not visualized.  No adnexal masses identified.  No free fluid in cul-de-sac.  Patient was seen by Dr. [name] in clinic on 11/13/2018.  Cervical exam reveals 2 cm exophytic irregular mass consistent with malignancy.    11/19/2018 CT chest abdomen pelvis with contrast showed thickened endometrium with some irregularity compatible with the provided diagnosis of endometrial malignancy.  There is a mildly prominent left inguinal node 1.4 cm.  Patient was seen by Dr. [name] on 11/20/2018 and left groin lymph node biopsy was recommended.  11/26/2018 patient underwent left inguinal lymph node biopsy. Pathology showed metastatic adenocarcinoma consistent with colorectal origin.  CDX 2+.  Case was discussed on tumor board.  Recommend colonoscopy for further evaluation.  Patient reports significant weight loss 30 pounds over the last year.  Chronic vaginal spotting. Change of bowel habits the past few months.  More constipated.  Family history positive for brother who has colon cancer prostate cancer.  patient has underwent colonoscopy on 12/03/2018 which reviewed a nonobstructing large mass in the rectum.  Also chronic fistula.  Mass was not circumferential.  This was biopsied with a cold forceps for histology.  Pathology came back hyperplastic polyp negative for dysplasia and malignancy. [name], patient underwent flex sigmoidoscopy on 12/06/2018 with rebiopsy of the rectal mass. This time biopsy results came back positive for invasive colorectal adenocarcinoma, moderately differentiated. Immunotherapy for nearly mismatch repair protein (MMR ) was performed.  There is no loss of MMR expression.  low probability of MSI high.   # Seen by [name] for evaluation of resectability for rectal cancer. In addition, she also had a second opinion with [facility] where her endometrial biopsy pathology was changed to  adenocarcinoma, consistent with colorectal primary.    [name]. She has home health that has been assisting with ostomy care  Patient was also evaluated by [facility] oncology.  Recommendation is to proceed with TNT with concurrent chemoradiation followed by neoadjuvant chemotherapy followed by surgical resection. Patient prefers to have treatment done locally with [facility].    02/04/2019 - 02/04/2019 Chemotherapy   The patient had capecitabine (XELODA) 150 MG tablet, 150 mg (100 % of original dose 150 mg), Oral, 2 times daily after meals, 1 of 1 cycle, Start date: 01/27/2019, End date: 04/03/2019 Dose modification: 150 mg (original dose 150 mg, Cycle 1) capecitabine (XELODA) 500 MG tablet, 1,500 mg (100 % of original dose 1,500 mg), Oral, 2 times daily after meals, 1 of 1 cycle, Start date: 01/27/2019, End date: 04/03/2019 Dose modification: 1,500 mg (original dose 1,500 mg, Cycle 1)  for chemotherapy treatment.    04/09/2019 - 07/18/2019 Chemotherapy   The patient had palonosetron (ALOXI) injection 0.25 mg, 0.25 mg, Intravenous,  Once, 8 of 8 cycles Administration: 0.25 mg (04/09/2019), 0.25 mg (04/23/2019), 0.25 mg (05/07/2019), 0.25 mg (05/21/2019), 0.25 mg (06/04/2019), 0.25 mg (06/18/2019), 0.25 mg (07/02/2019), 0.25 mg (07/16/2019) leucovorin 850 mg in dextrose 5 % 250 mL infusion, 844 mg, Intravenous,  Once, 8 of 8 cycles Administration: 850 mg (04/09/2019), 850 mg (04/23/2019), 850 mg (05/07/2019), 850 mg (05/21/2019), 800 mg (06/18/2019), 800 mg (07/02/2019), 800 mg (07/16/2019) oxaliplatin (ELOXATIN) 150 mg in dextrose 5 % 500 mL chemo infusion, 71 mg/m2 = 160 mg (100 % of original dose 75 mg/m2), Intravenous,  Once, 8 of 8 cycles Dose modification: 75 mg/m2 (original dose 75 mg/m2, Cycle 1, Reason: Provider Judgment) Administration: 150 mg (04/09/2019), 150 mg (  04/23/2019), 150 mg (05/07/2019), 150 mg (05/21/2019), 150 mg (06/04/2019), 150 mg (06/18/2019), 150 mg (07/02/2019), 150 mg (07/16/2019) fluorouracil (ADRUCIL) 5,000 mg in sodium chloride 0.9 % 150  mL chemo infusion, 2,370 mg/m2 = 5,050 mg, Intravenous, 1 Day/Dose, 8 of 8 cycles Administration: 5,000 mg (04/09/2019), 5,000 mg (04/23/2019), 5,000 mg (05/07/2019), 5,000 mg (05/21/2019), 5,000 mg (06/04/2019), 5,000 mg (06/18/2019), 5,000 mg (07/02/2019), 5,000 mg (07/16/2019)  for chemotherapy treatment.    10/06/2019 Cancer Staging   Staging form: Colon and Rectum, AJCC 8th Edition - Pathologic stage from 10/06/2019: Stage IIC (ypT4b, pN0, cM0) - Signed by [name], MD on 10/06/2019   06/30/2020 Cancer Staging   Staging form: Colon and Rectum, AJCC 8th Edition - Clinical stage from 06/30/2020: [name] - Signed by [name], MD on 04/17/2022 Stage prefix: Recurrence Total positive nodes: 1   07/16/2020 - 07/19/2020 Chemotherapy         01/31/2021 Cancer Staging   Staging form: Colon and Rectum, AJCC 8th Edition - Pathologic: Stage Unknown (rpTX, pNX, cM1) - Signed by [name], MD on 01/31/2021 Stage prefix: Recurrence   07/14/2022 Imaging   Bone scan  1. No evidence of skeletal metastatic disease in the hips. 2. Asymmetric increased activity in the RIGHT humeral head is favored degenerative as described above. If pain in the RIGHT shoulder consider further evaluation with plain film or MRI   07/17/2022 Imaging   1. Stable scattered small bilateral pulmonary nodules, largest 8 by 6 mm in the right middle lobe. 2. Subtle chronically stable posterior cortical irregularity and heterogeneity in the left iliac bone, not appreciable on the bone scan of 07/14/2022. This has been present at least over the past year and could reflect radiation therapy related findings although strictly speaking, malignant involvement of the left iliac bone is difficult to exclude. If clinically warranted this could be further assessed with nuclear medicine PET-CT or MRI of the bony pelvis with and without contrast. 3. Pelvic floor laxity with loops of small bowel extending 3 cm below the pubococcygeal line. 4. Aortic  and systemic atherosclerosis as detailed above. Aortic Atherosclerosis    12/26/2022 Imaging   CT chest abdomen pelvis w contrast 1. Stable scattered solid pulmonary nodules. 2. Right inguinal and right pelvic lymph nodes which were hypermetabolic on prior PET-CT are stable. 3. Osseous lesion of the proximal right humerus demonstrates increased sclerosis when compared with the prior PET-CT, possibly due to treatment response. 4. 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 5. Aortic Atherosclerosis     06/25/2023 Imaging   CT chest abdomen pelvis w contrast showed 1. Increased size of the solid bilateral pulmonary nodules,consistent with worsening metastatic disease. 2. Increased size of the right external iliac and inguinal lymph nodes, concerning for worsening metastatic disease. 3. No significant interval change in the left iliac bone osseous metastasis. 4. Enlarged main pulmonary artery measuring 3.6 cm, which can be seen in the setting of pulmonary arterial hypertension. 5.  Aortic Atherosclerosis (ICD10-I70.0)     Miscellaneous   Tempus liquid biopsy showed no actionable variants.  NOTCH1 missense variant, BRCA2 missense variant , MYC missense variant   07/11/2023 Imaging   PET showed 1. Multifocal tracer avid abdominopelvic lymph nodes are identified compatible with metastatic disease. 2. Multifocal bilateral pulmonary nodules are identified compatible with pulmonary metastasis.  3. Diffuse increased radiotracer uptake throughout the marrow space is identified diminishing sensitivity for detecting osseous metastasis. No abnormal increased radiotracer uptake identified above background increased bone marrow  activity to indicate metabolically active bone metastases. 4.  Aortic Atherosclerosis   Metastatic adenocarcinoma [facility])       # Oncology Treatment:  02/03/2019- 03/19/2019  concurrent Xeloda and radiation.  Xeloda dose 825mg  /m2 BID - rounded to 1650mg  BID- on days of  radiation. 04/09/2019, started on FOLFOX with bolus early.  Omitted.  07/16/2019 finished 8 cycles of FOLFOX. 09/17/19 APR/posterior vaginectomy/TAH/BSO/VY-flap pT4b pN0 with close vaginal margin 0.2 mm.  Uterus and ovaries negative for malignancy. Patient reports bilateral lower extremity numbness and tingling, intermittent, left worse than right. She has lost a lot of weight since her APR surgery.   #Family history with half brother having's history of colon cancer prostate cancer.  Personal history of colorectal cancer.  Patient has not decided if she wants genetic testing.    # history of PE( 01/13/2020)  in the bilateral lower extremity DVT (01/13/2020).   She finishes 6 months of anticoagulation with Eliquis 5 mg twice daily. Now switched to Eliquis 2.5 mg twice daily..  # She has now developed recurrent disease. #06/30/20  vaginal introitus mass biopsied. Pathology is consistent with metastatic colorectal adenocarcinoma I have discussed with [name]  Dr. Luciano Cutter and the mass is not resectable. Patient has also had colonoscopy by Dr. Tobi Bastos yesterday. Normal examination. # 07/16/2020 cycle 1 FOLFIRI  # 07/20/2020 PET scan was done for further evaluation, images are consistent with local recurrence, no distant metastasis. #Discussed with radiation oncology Dr. Rushie Chestnut will recommends concurrent chemotherapy and radiation. 08/02/2020-08/16/2020, patient starts radiation.  Xeloda was held due to neutropenia 08/17/2020,-09/06/2020 Xeloda 1500 mg twice daily concurrently with radiation  01/31/21 started on FOLFIRI + Bev 05/18/2021 CT chest abdomen pelvis showed Previously noted enlargement of bilateral inguinal lymph nodes is resolved, consistent with treatment response of nodal metastatic disease. Interval decrease in size of multiple small bilateral pulmonary nodules, consistent with treatment response of pulmonary metastatic disease. No evidence of new metastatic disease. 05/24/2021 - 08/30/2021,  continued on FOLFIRI plus bevacizumab.  Irinotecan dose was reduced, eventually 100mg /m2  09/02/2021, CT chest abdomen pelvis without contrast Showed small bilateral pulmonary nodules, unchanged.  Stable metastatic disease.  No noncontrast evidence of new metastatic disease in the chest abdomen pelvis.  Small parastomal hernia.  Enlargement of main pulmonary artery.  Coronary artery disease.  09/13/2021, maintenance 5-FU/bevacizumab 11/28/2021, 5-FU/Irinotecan/bevacizumab.  Irinotecan 100 mg/m2 was added back due to progressively increasing CEA.  -right lower extremity edema, US showed non occlusive DVT, edema has improved.   INTERVAL HISTORY Jacqueline Yoder is a 77 y.o. female who has above history reviewed by me presents for follow-up of rectal cancer. On Irinotecan and panitumumab. she tolerates well.   She was accompanied by her daughter. - chronic diarrhea is manageable. Was seen by Dr. Rushie Chestnut, no plan of palliative RT currently.  She take Lomotil PRN, usually 1-2 time per day.  + vaginal spotting sometimes + she reports manageable neuropathy symptoms of her fingertips, worse when exposed to cold temperature.   Review of Systems  Constitutional:  Positive for fatigue. Negative for appetite change, chills, fever and unexpected weight change.  HENT:   Negative for hearing loss and voice change.   Eyes:  Negative for eye problems.  Respiratory:  Negative for chest tightness and cough.   Cardiovascular:  Negative for chest pain.  Gastrointestinal:  Positive for diarrhea. Negative for abdominal distention, abdominal pain, blood in stool, constipation and nausea.  Endocrine: Negative for hot flashes.  Genitourinary:  Positive for vaginal bleeding. Negative  for difficulty urinating and frequency.   Musculoskeletal:  Positive for arthralgias.  Skin:  Negative for itching.  Neurological:  Positive for numbness. Negative for extremity weakness.  Hematological:  Negative for adenopathy.   Psychiatric/Behavioral:  Negative for confusion.     MEDICAL HISTORY:  Past Medical History:  Diagnosis Date   Allergy    Arthritis    Blood clot in vein    Family history of colon cancer    GERD (gastroesophageal reflux disease)    Hypercholesteremia    Hypertension    Hypertension    Lower extremity edema    Personal history of chemotherapy    Rectal cancer (HCC) 12/2018   Urinary incontinence     SURGICAL HISTORY: Past Surgical History:  Procedure Laterality Date   ABDOMINAL HYSTERECTOMY     CHOLECYSTECTOMY  1971   COLONOSCOPY WITH PROPOFOL N/A 12/03/2018   Procedure: COLONOSCOPY WITH PROPOFOL;  Surgeon: Midge Minium, MD;  Location: [facility] ENDOSCOPY;  Service: Endoscopy;  Laterality: N/A;   COLONOSCOPY WITH PROPOFOL N/A 07/15/2020   Procedure: COLONOSCOPY WITH PROPOFOL;  Surgeon: Wyline Mood, MD;  Location: [facility] ENDOSCOPY;  Service: Gastroenterology;  Laterality: N/A;   FLEXIBLE SIGMOIDOSCOPY N/A 12/06/2018   Procedure: FLEXIBLE SIGMOIDOSCOPY;  Surgeon: Wyline Mood, MD;  Location: [facility] ENDOSCOPY;  Service: Endoscopy;  Laterality: N/A;   LAPAROSCOPIC COLOSTOMY  01/06/2019   PORTACATH PLACEMENT N/A 04/03/2019   Procedure: INSERTION PORT-A-CATH;  Surgeon: Leafy Ro, MD;  Location: [facility] ORS;  Service: General;  Laterality: N/A;    SOCIAL HISTORY: Social History   Socioeconomic History   Marital status: Widowed    Spouse name: Not on file   Number of children: Not on file   Years of education: Not on file   Highest education level: Some college, no degree  Occupational History   Not on file  Tobacco Use   Smoking status: Former    Current packs/day: 0.00    Types: Cigarettes    Quit date: 12/02/1977    Years since quitting: 45.8   Smokeless tobacco: Former  Building services engineer status: Never Used  Substance and Sexual Activity   Alcohol use: Never   Drug use: Never   Sexual activity: Not Currently    Birth control/protection: None  Other Topics Concern   Not  on file  Social History Narrative   Lives with daughter   Social Determinants of Health   Financial Resource Strain: Low Risk  (07/09/2023)   Overall Financial Resource Strain (CARDIA)    Difficulty of Paying Living Expenses: Not very hard  Food Insecurity: No Food Insecurity (07/09/2023)   Hunger Vital Sign    Worried About Running Out of Food in the Last Year: Never true    Ran Out of Food in the Last Year: Never true  Transportation Needs: No Transportation Needs (07/09/2023)   PRAPARE - Administrator, Civil Service (Medical): No    Lack of Transportation (Non-Medical): No  Physical Activity: Insufficiently Active (07/09/2023)   Exercise Vital Sign    Days of Exercise per Week: 2 days    Minutes of Exercise per Session: 10 min  Stress: No Stress Concern Present (07/09/2023)   [employer] of Occupational Health - Occupational Stress Questionnaire    Feeling of Stress : Not at all  Social Connections: Moderately Integrated (07/09/2023)   Social Connection and Isolation Panel [NHANES]    Frequency of Communication with Friends and Family: More than three times a week  Frequency of Social Gatherings with Friends and Family: More than three times a week    Attends Religious Services: More than 4 times per year    Active Member of Clubs or Organizations: Yes    Attends Banker Meetings: More than 4 times per year    Marital Status: Widowed  Recent Concern: Social Connections - Moderately Isolated (07/06/2023)   Social Connection and Isolation Panel [NHANES]    Frequency of Communication with Friends and Family: More than three times a week    Frequency of Social Gatherings with Friends and Family: More than three times a week    Attends Religious Services: More than 4 times per year    Active Member of [employer] or Organizations: No    Attends Banker Meetings: Never    Marital Status: Widowed  Intimate Partner Violence: Not At Risk (07/06/2023)    Humiliation, Afraid, Rape, and Kick questionnaire    Fear of Current or Ex-Partner: No    Emotionally Abused: No    Physically Abused: No    Sexually Abused: No    FAMILY HISTORY: Family History  Problem Relation Age of Onset   Colon cancer Brother 19       exposure to chemicals [place]   Hypertension Mother    Stroke Mother    Kidney failure Father    Breast cancer Neg Hx    Ovarian cancer Neg Hx     ALLERGIES:  is allergic to sulfamethoxazole-trimethoprim.  MEDICATIONS:  Current Outpatient Medications  Medication Sig Dispense Refill   Cholecalciferol (VITAMIN D3) 2000 units capsule Take 2,000 Units by mouth daily.     clindamycin (CLINDAGEL) 1 % gel Apply topically 2 (two) times daily. 30 g 5   diclofenac sodium (VOLTAREN) 1 % GEL Apply 2 g topically 4 (four) times daily as needed (joint pain).  11   diphenoxylate-atropine (LOMOTIL) 2.5-0.025 MG tablet Take 1 tablet by mouth 4 (four) times daily as needed for diarrhea or loose stools. 120 tablet 0   ELIQUIS 2.5 MG TABS tablet TAKE 1 TABLET BY MOUTH TWICE  DAILY 200 tablet 2   fluticasone (FLONASE) 50 MCG/ACT nasal spray USE 1 SPRAY IN EACH NOSTRIL ONCE DAILY 16 g 3   gentamicin cream (GARAMYCIN) 0.1 % Apply to affected toe once daily. 30 g 1   ipratropium (ATROVENT) 0.03 % nasal spray USE 1 SPRAY IN BOTH NOSTRILS  TWICE DAILY 60 mL 2   ketoconazole (NIZORAL) 2 % cream Apply 1 Application topically daily. For up to 2 weeks. May repeat if need. 30 g 2   lidocaine-prilocaine (EMLA) cream Apply 1 application. topically as needed. 30 g 6   loperamide (IMODIUM) 2 MG capsule Take 1 capsule (2 mg total) by mouth See admin instructions. With onset of loose stool, take 4mg  followed by 2mg  every 2 hours,  Maximum: 16 mg/day 120 capsule 1   loratadine (CLARITIN) 10 MG tablet Take 10 mg by mouth daily.     magnesium chloride (SLOW-MAG) 64 MG TBEC SR tablet Take 1 tablet (64 mg total) by mouth 2 (two) times daily. 60 tablet 2   Multiple  Vitamins-Minerals (ONE-A-DAY WOMENS 50 PLUS PO) Take 1 tablet by mouth daily.      nystatin (MYCOSTATIN/NYSTOP) powder APPLY 1 POWDER TOPICALLY THREE TIMES DAILY 45 g 0   potassium chloride SA (KLOR-CON M) 20 MEQ tablet TAKE 1 TABLET BY MOUTH DAILY 100 tablet 2   simvastatin (ZOCOR) 40 MG tablet Take 1 tablet (40 mg  total) by mouth at bedtime. 100 tablet 3   triamcinolone cream (KENALOG) 0.5 % Apply 1 Application topically 2 (two) times daily. To affected areas, for up to 2 weeks. 30 g 2   triamterene-hydrochlorothiazide (DYAZIDE) 37.5-25 MG capsule Take 1 each (1 capsule total) by mouth daily. 100 capsule 3   zinc gluconate 50 MG tablet Take 50 mg by mouth daily.     No current facility-administered medications for this visit.   Facility-Administered Medications Ordered in Other Visits  Medication Dose Route Frequency Provider Last Rate Last Admin   heparin lock flush 100 UNIT/ML injection              PHYSICAL EXAMINATION: ECOG PERFORMANCE STATUS: 1 - Symptomatic but completely ambulatory  Physical Exam Constitutional:      General: She is not in acute distress. HENT:     Head: Normocephalic and atraumatic.  Eyes:     General: No scleral icterus. Cardiovascular:     Rate and Rhythm: Normal rate.     Heart sounds: Normal heart sounds.  Pulmonary:     Effort: Pulmonary effort is normal. No respiratory distress.     Breath sounds: No wheezing.  Abdominal:     General: Bowel sounds are normal. There is no distension.     Palpations: Abdomen is soft.     Comments: + Colostomy bag   Musculoskeletal:        General: No deformity. Normal range of motion.     Cervical back: Normal range of motion and neck supple.  Skin:    General: Skin is warm and dry.  Neurological:     Mental Status: She is alert and oriented to person, place, and time. Mental status is at baseline.     Cranial Nerves: No cranial nerve deficit.     Coordination: Coordination normal.       LABORATORY  DATA:  I have reviewed the data as listed    Latest Ref Rng & Units 09/10/2023    8:27 AM 08/27/2023    7:55 AM 08/14/2023   10:18 AM  CBC  WBC 4.0 - 10.5 K/uL 2.7  3.7  3.7   Hemoglobin 12.0 - 15.0 g/dL 28.4  13.2  44.0   Hematocrit 36.0 - 46.0 % 33.9  34.3  36.1   Platelets 150 - 400 K/uL 170  158  257       Latest Ref Rng & Units 08/27/2023    7:55 AM 08/14/2023   10:18 AM 08/06/2023    8:36 AM  CMP  Glucose 70 - 99 mg/dL 102  725  366   BUN 8 - 23 mg/dL 15  18  13    Creatinine 0.44 - 1.00 mg/dL 4.40  3.47  4.25   Sodium 135 - 145 mmol/L 138  138  138   Potassium 3.5 - 5.1 mmol/L 3.6  3.6  3.5   Chloride 98 - 111 mmol/L 102  104  101   CO2 22 - 32 mmol/L 29  27  27    Calcium 8.9 - 10.3 mg/dL 9.0  8.6  8.6   Total Protein 6.5 - 8.1 g/dL 6.5  6.5  6.5   Total Bilirubin <1.2 mg/dL 0.4  0.6  0.4   Alkaline Phos 38 - 126 U/L 61  68  60   AST 15 - 41 U/L 43  38  41   ALT 0 - 44 U/L 27  24  26       RADIOGRAPHIC STUDIES: I  have personally reviewed the radiological images as listed and agreed with the findings in the report. NM PET Image Restag (PS) Skull Base To Thigh  Result Date: 07/11/2023 CLINICAL DATA:  Subsequent treatment strategy for colorectal carcinoma. EXAM: NUCLEAR MEDICINE PET SKULL BASE TO THIGH TECHNIQUE: 8.66 mCi F-18 FDG was injected intravenously. Full-ring PET imaging was performed from the skull base to thigh after the radiotracer. CT data was obtained and used for attenuation correction and anatomic localization. Fasting blood glucose: 64 mg/dl COMPARISON:  CT chest, abdomen and pelvis 06/25/2023 and PET-CT from 09/19/2022 FINDINGS: Mediastinal blood pool activity: SUV max 2.37 Liver activity: SUV max NA NECK: No hypermetabolic lymph nodes in the neck. Incidental CT findings: None. CHEST: Left supraclavicular lymph node measures 0.6 cm within SUV max of 4.85, image 32/6. This is new when compared with the previous PET-CT. On 06/25/2023 this measured the same.  Multifocal bilateral pulmonary nodules are identified compatible with pulmonary metastasis. -Index nodule within the right middle lobe measures 1.4 cm with SUV max of 4.30 unchanged in size from 06/25/2023. On the PET-CT from 09/19/2022 this measured 1.2 cm and had an SUV max of 0.74. -Additional scattered lung nodules are identified throughout both lungs most of which are too small to characterize by PET-CT. The largest of these is in the anteromedial right middle lobe measuring 8 mm with SUV max of 1.22, image 37/2. Stable from 06/25/2023. On the PET-CT from 09/19/2022 this measured 8 mm with SUV max of 1.17. Incidental CT findings: No pleural effusion or consolidative change. Aortic atherosclerotic calcifications. ABDOMEN/PELVIS: No tracer avid liver metastases. There is no abnormal tracer uptake identified within the liver, pancreas, or spleen. Multifocal tracer avid abdominopelvic lymph nodes are identified. Tracer avid left periaortic lymph node measures 8 mm within SUV max of 8.36. This is new compared with the previous exam. Right common iliac lymph node measures 1 cm and has an SUV max of 6.6, image 107/6. This is also new compared with the prior exam. Right external iliac lymph node measures 7 mm within SUV max of 6.06. This is similar to the recent CT from 06/25/2023. On the previous PET-CT from 09/19/22 this measured 7 mm with SUV max of 7.7. More distally, there is a new tracer avid right external iliac lymph node which measures 1.4 cm within SUV max of 6.63. Right inguinal lymph node measures 1.1 cm within SUV max of 5.16. Formally this measured 0.9 cm with SUV max of 5.13. No tracer avid peritoneal nodule Incidental CT findings: Left lower quadrant descending colostomy. Aortic atherosclerosis. SKELETON: Diffuse increased radiotracer uptake throughout the marrow space is identified diminishing sensitivity for detecting osseous metastasis. No abnormal increased radiotracer uptake identified above  background increased bone marrow activity to indicate metabolically active bone metastases. Incidental CT findings: None. IMPRESSION: 1. Multifocal tracer avid abdominopelvic lymph nodes are identified compatible with metastatic disease. Although similar when compared with 06/25/2023, there are new tracer avid abdominopelvic lymph nodes identified when compared with the previous PET-CT from 09/19/2022. 2. Multifocal bilateral pulmonary nodules are identified compatible with pulmonary metastasis. The largest of these is in the right middle lobe measuring 1.4 cm with SUV max of 4.30. This is unchanged in size from 06/25/2023. On the PET-CT from 09/19/2022 this measured 1.2 cm and had an SUV max of 0.74. The remaining lung nodules are too small to characterize by PET-CT. 3. Diffuse increased radiotracer uptake throughout the marrow space is identified diminishing sensitivity for detecting osseous metastasis. No abnormal increased radiotracer  uptake identified above background increased bone marrow activity to indicate metabolically active bone metastases. 4.  Aortic Atherosclerosis (ICD10-I70.0). Electronically Signed   By: Signa Kell M.D.   On: 07/11/2023 12:08   CT CHEST ABDOMEN PELVIS W CONTRAST  Result Date: 06/25/2023 CLINICAL DATA:  Follow-up metastatic rectal cancer. * Tracking Code: BO * EXAM: CT CHEST, ABDOMEN, AND PELVIS WITH CONTRAST TECHNIQUE: Multidetector CT imaging of the chest, abdomen and pelvis was performed following the standard protocol during bolus administration of intravenous contrast. RADIATION DOSE REDUCTION: This exam was performed according to the departmental dose-optimization program which includes automated exposure control, adjustment of the mA and/or kV according to patient size and/or use of iterative reconstruction technique. CONTRAST:  85mL OMNIPAQUE IOHEXOL 300 MG/ML  SOLN COMPARISON:  Multiple priors including most recent CT March 20, 2023. FINDINGS: CT CHEST FINDINGS  Cardiovascular: Right chest Port-A-Cath with tip in the right atrium. Aortic atherosclerosis. Enlarged main pulmonary artery measuring 3.6 cm. Coronary artery calcifications normal size heart. No significant pericardial effusion/thickening. Mediastinum/Nodes: No suspicious thyroid nodule. No pathologically enlarged mediastinal, hilar or axillary lymph nodes esophagus is grossly unremarkable Lungs/Pleura: Increased size of the solid bilateral pulmonary nodules. For reference: -medial right middle lobe pulmonary nodule measures 17 x 13 mm on image 102/3 previously 14 x 11 mm. No pleural effusion. No pneumothorax. Scattered atelectasis/scarring. Musculoskeletal: Thoracic spondylosis. CT ABDOMEN PELVIS FINDINGS Hepatobiliary: Stable 8 mm subcapsular lesion in the anterior inferior right hepatic lobe segment V on image 71/2 common non FDG avid on prior PET-CT stable over multiple prior examinations favored benign. No discrete suspicious hepatic lesion identified. Gallbladder surgically absent. Similar mild dilation of the biliary tree with the common duct measuring 10 mm but smoothly tapering to the level of the ampulla and favored reservoir effect post cholecystectomy Pancreas: No pancreatic ductal dilation or evidence of acute inflammation. Spleen: No splenomegaly.  Stable 11 mm splenic cyst on image 61/2 Adrenals/Urinary Tract: Bilateral adrenal glands appear normal. No hydronephrosis stable benign bilateral renal cysts, including a 2.1 cm lesion in the lateral interpolar left kidney which measures Hounsfield units slightly greater than that of simple fluid but was previously classifies as a cyst on MRI March 16, 2020, also stable are the hypodense renal lesions technically too small to accurately characterize but statistically likely to reflect cysts. These are considered benign and requiring no independent imaging follow-up. Urinary bladder is unremarkable for degree of distension Stomach/Bowel: Radiopaque enteric  contrast material traverses the ileocecal valve. Stomach is unremarkable for degree of distension. No pathologic dilation of small or large bowel. Normal appendix. Colonic diverticulosis without findings of acute diverticulitis Prior abdominoperineal resection with mid transverse end colostomy in the left anterior abdominal wall. Fat containing peristomal hernia. Vascular/Lymphatic: Aortic atherosclerosis. Smooth IVC contours. The portal, splenic and superior mesenteric veins are patent. Previously indexed right external iliac lymph node measures 11 mm in short axis on image 100/2 previously 6 mm. Previously indexed inguinal lymph node measures 14 mm in short axis on image 107/2 previously 11 mm. Reproductive: Status post hysterectomy and bilateral salpingo-oophorectomy. Other: No significant abdominopelvic free fluid. Fat containing peristomal hernia Musculoskeletal: No significant interval change in the left iliac bone osseous metastasis on image 83/2. Multilevel degenerative changes spine. Probable postradiation change in the sacrum IMPRESSION: 1. Increased size of the solid bilateral pulmonary nodules, consistent with worsening metastatic disease. 2. Increased size of the right external iliac and inguinal lymph nodes, concerning for worsening metastatic disease. 3. No significant interval change in the left iliac  bone osseous metastasis. 4. Enlarged main pulmonary artery measuring 3.6 cm, which can be seen in the setting of pulmonary arterial hypertension. 5.  Aortic Atherosclerosis (ICD10-I70.0). Electronically Signed   By: Maudry Mayhew M.D.   On: 06/25/2023 11:35      NM PET Image Restag (PS) Skull Base To Thigh  Result Date: 07/11/2023 CLINICAL DATA:  Subsequent treatment strategy for colorectal carcinoma. EXAM: NUCLEAR MEDICINE PET SKULL BASE TO THIGH TECHNIQUE: 8.66 mCi F-18 FDG was injected intravenously. Full-ring PET imaging was performed from the skull base to thigh after the radiotracer. CT  data was obtained and used for attenuation correction and anatomic localization. Fasting blood glucose: 64 mg/dl COMPARISON:  CT chest, abdomen and pelvis 06/25/2023 and PET-CT from 09/19/2022 FINDINGS: Mediastinal blood pool activity: SUV max 2.37 Liver activity: SUV max NA NECK: No hypermetabolic lymph nodes in the neck. Incidental CT findings: None. CHEST: Left supraclavicular lymph node measures 0.6 cm within SUV max of 4.85, image 32/6. This is new when compared with the previous PET-CT. On 06/25/2023 this measured the same. Multifocal bilateral pulmonary nodules are identified compatible with pulmonary metastasis. -Index nodule within the right middle lobe measures 1.4 cm with SUV max of 4.30 unchanged in size from 06/25/2023. On the PET-CT from 09/19/2022 this measured 1.2 cm and had an SUV max of 0.74. -Additional scattered lung nodules are identified throughout both lungs most of which are too small to characterize by PET-CT. The largest of these is in the anteromedial right middle lobe measuring 8 mm with SUV max of 1.22, image 37/2. Stable from 06/25/2023. On the PET-CT from 09/19/2022 this measured 8 mm with SUV max of 1.17. Incidental CT findings: No pleural effusion or consolidative change. Aortic atherosclerotic calcifications. ABDOMEN/PELVIS: No tracer avid liver metastases. There is no abnormal tracer uptake identified within the liver, pancreas, or spleen. Multifocal tracer avid abdominopelvic lymph nodes are identified. Tracer avid left periaortic lymph node measures 8 mm within SUV max of 8.36. This is new compared with the previous exam. Right common iliac lymph node measures 1 cm and has an SUV max of 6.6, image 107/6. This is also new compared with the prior exam. Right external iliac lymph node measures 7 mm within SUV max of 6.06. This is similar to the recent CT from 06/25/2023. On the previous PET-CT from 09/19/22 this measured 7 mm with SUV max of 7.7. More distally, there is a new tracer  avid right external iliac lymph node which measures 1.4 cm within SUV max of 6.63. Right inguinal lymph node measures 1.1 cm within SUV max of 5.16. Formally this measured 0.9 cm with SUV max of 5.13. No tracer avid peritoneal nodule Incidental CT findings: Left lower quadrant descending colostomy. Aortic atherosclerosis. SKELETON: Diffuse increased radiotracer uptake throughout the marrow space is identified diminishing sensitivity for detecting osseous metastasis. No abnormal increased radiotracer uptake identified above background increased bone marrow activity to indicate metabolically active bone metastases. Incidental CT findings: None. IMPRESSION: 1. Multifocal tracer avid abdominopelvic lymph nodes are identified compatible with metastatic disease. Although similar when compared with 06/25/2023, there are new tracer avid abdominopelvic lymph nodes identified when compared with the previous PET-CT from 09/19/2022. 2. Multifocal bilateral pulmonary nodules are identified compatible with pulmonary metastasis. The largest of these is in the right middle lobe measuring 1.4 cm with SUV max of 4.30. This is unchanged in size from 06/25/2023. On the PET-CT from 09/19/2022 this measured 1.2 cm and had an SUV max of 0.74. The remaining  lung nodules are too small to characterize by PET-CT. 3. Diffuse increased radiotracer uptake throughout the marrow space is identified diminishing sensitivity for detecting osseous metastasis. No abnormal increased radiotracer uptake identified above background increased bone marrow activity to indicate metabolically active bone metastases. 4.  Aortic Atherosclerosis (ICD10-I70.0). Electronically Signed   By: Signa Kell M.D.   On: 07/11/2023 12:08   CT CHEST ABDOMEN PELVIS W CONTRAST  Result Date: 06/25/2023 CLINICAL DATA:  Follow-up metastatic rectal cancer. * Tracking Code: BO * EXAM: CT CHEST, ABDOMEN, AND PELVIS WITH CONTRAST TECHNIQUE: Multidetector CT imaging of the chest,  abdomen and pelvis was performed following the standard protocol during bolus administration of intravenous contrast. RADIATION DOSE REDUCTION: This exam was performed according to the departmental dose-optimization program which includes automated exposure control, adjustment of the mA and/or kV according to patient size and/or use of iterative reconstruction technique. CONTRAST:  85mL OMNIPAQUE IOHEXOL 300 MG/ML  SOLN COMPARISON:  Multiple priors including most recent CT March 20, 2023. FINDINGS: CT CHEST FINDINGS Cardiovascular: Right chest Port-A-Cath with tip in the right atrium. Aortic atherosclerosis. Enlarged main pulmonary artery measuring 3.6 cm. Coronary artery calcifications normal size heart. No significant pericardial effusion/thickening. Mediastinum/Nodes: No suspicious thyroid nodule. No pathologically enlarged mediastinal, hilar or axillary lymph nodes esophagus is grossly unremarkable Lungs/Pleura: Increased size of the solid bilateral pulmonary nodules. For reference: -medial right middle lobe pulmonary nodule measures 17 x 13 mm on image 102/3 previously 14 x 11 mm. No pleural effusion. No pneumothorax. Scattered atelectasis/scarring. Musculoskeletal: Thoracic spondylosis. CT ABDOMEN PELVIS FINDINGS Hepatobiliary: Stable 8 mm subcapsular lesion in the anterior inferior right hepatic lobe segment V on image 71/2 common non FDG avid on prior PET-CT stable over multiple prior examinations favored benign. No discrete suspicious hepatic lesion identified. Gallbladder surgically absent. Similar mild dilation of the biliary tree with the common duct measuring 10 mm but smoothly tapering to the level of the ampulla and favored reservoir effect post cholecystectomy Pancreas: No pancreatic ductal dilation or evidence of acute inflammation. Spleen: No splenomegaly.  Stable 11 mm splenic cyst on image 61/2 Adrenals/Urinary Tract: Bilateral adrenal glands appear normal. No hydronephrosis stable benign bilateral  renal cysts, including a 2.1 cm lesion in the lateral interpolar left kidney which measures Hounsfield units slightly greater than that of simple fluid but was previously classifies as a cyst on MRI March 16, 2020, also stable are the hypodense renal lesions technically too small to accurately characterize but statistically likely to reflect cysts. These are considered benign and requiring no independent imaging follow-up. Urinary bladder is unremarkable for degree of distension Stomach/Bowel: Radiopaque enteric contrast material traverses the ileocecal valve. Stomach is unremarkable for degree of distension. No pathologic dilation of small or large bowel. Normal appendix. Colonic diverticulosis without findings of acute diverticulitis Prior abdominoperineal resection with mid transverse end colostomy in the left anterior abdominal wall. Fat containing peristomal hernia. Vascular/Lymphatic: Aortic atherosclerosis. Smooth IVC contours. The portal, splenic and superior mesenteric veins are patent. Previously indexed right external iliac lymph node measures 11 mm in short axis on image 100/2 previously 6 mm. Previously indexed inguinal lymph node measures 14 mm in short axis on image 107/2 previously 11 mm. Reproductive: Status post hysterectomy and bilateral salpingo-oophorectomy. Other: No significant abdominopelvic free fluid. Fat containing peristomal hernia Musculoskeletal: No significant interval change in the left iliac bone osseous metastasis on image 83/2. Multilevel degenerative changes spine. Probable postradiation change in the sacrum IMPRESSION: 1. Increased size of the solid bilateral pulmonary nodules, consistent  with worsening metastatic disease. 2. Increased size of the right external iliac and inguinal lymph nodes, concerning for worsening metastatic disease. 3. No significant interval change in the left iliac bone osseous metastasis. 4. Enlarged main pulmonary artery measuring 3.6 cm, which can be seen  in the setting of pulmonary arterial hypertension. 5.  Aortic Atherosclerosis (ICD10-I70.0). Electronically Signed   By: Maudry Mayhew M.D.   On: 06/25/2023 11:35

## 2023-09-10 NOTE — Patient Instructions (Signed)
[facility] - A DEPT OF [facility]  Discharge Instructions: Thank you for choosing [facility] to provide your oncology and hematology care.  If you have a lab appointment with the Cancer Center, please go directly to the Cancer Center and check in at the registration area.  Wear comfortable clothing and clothing appropriate for easy access to any Portacath or PICC line.   We strive to give you quality time with your provider. You may need to reschedule your appointment if you arrive late (15 or more minutes).  Arriving late affects you and other patients whose appointments are after yours.  Also, if you miss three or more appointments without notifying the office, you may be dismissed from the clinic at the provider's discretion.      For prescription refill requests, have your pharmacy contact our office and allow 72 hours for refills to be completed.    Today you received the following chemotherapy and/or immunotherapy agents oxaliplatin, leucovorin, and adrucil      To help prevent nausea and vomiting after your treatment, we encourage you to take your nausea medication as directed.  BELOW ARE SYMPTOMS THAT SHOULD BE REPORTED IMMEDIATELY: *FEVER GREATER THAN 100.4 F (38 C) OR HIGHER *CHILLS OR SWEATING *NAUSEA AND VOMITING THAT IS NOT CONTROLLED WITH YOUR NAUSEA MEDICATION *UNUSUAL SHORTNESS OF BREATH *UNUSUAL BRUISING OR BLEEDING *URINARY PROBLEMS (pain or burning when urinating, or frequent urination) *BOWEL PROBLEMS (unusual diarrhea, constipation, pain near the anus) TENDERNESS IN MOUTH AND THROAT WITH OR WITHOUT PRESENCE OF ULCERS (sore throat, sores in mouth, or a toothache) UNUSUAL RASH, SWELLING OR PAIN  UNUSUAL VAGINAL DISCHARGE OR ITCHING   Items with * indicate a potential emergency and should be followed up as soon as possible or go to the Emergency Department if any problems should occur.  Please show the CHEMOTHERAPY  ALERT CARD or IMMUNOTHERAPY ALERT CARD at check-in to the Emergency Department and triage nurse.  Should you have questions after your visit or need to cancel or reschedule your appointment, please contact [facility] - A DEPT OF [facility]  [phone] and follow the prompts.  Office hours are 8:00 a.m. to 4:30 p.m. Monday - Friday. Please note that voicemails left after 4:00 p.m. may not be returned until the following business day.  We are closed weekends and major holidays. You have access to a nurse at all times for urgent questions. Please call the main number to the clinic [phone] and follow the prompts.  For any non-urgent questions, you may also contact your provider using MyChart. We now offer e-Visits for anyone 29 and older to request care online for non-urgent symptoms. For details visit [email or web address].   Also download the MyChart app! Go to the app store, search "MyChart", open the app, select [facility], and log in with your MyChart username and password.

## 2023-09-10 NOTE — Assessment & Plan Note (Signed)
Hb stable, observation

## 2023-09-10 NOTE — Assessment & Plan Note (Signed)
History of PE and bilateral lower extremity DVT  05/18/23 Repeat right LE US showed chronic non occlusive DVT Continue  Eliquis 2.5 mg twice daily for anticoagulation prophylaxis. Recommend leg elevation and compression stocking.

## 2023-09-11 ENCOUNTER — Encounter: Payer: Self-pay | Admitting: Oncology

## 2023-09-11 DIAGNOSIS — Z556 Problems related to health literacy: Secondary | ICD-10-CM | POA: Diagnosis not present

## 2023-09-11 DIAGNOSIS — L89152 Pressure ulcer of sacral region, stage 2: Secondary | ICD-10-CM | POA: Diagnosis not present

## 2023-09-11 DIAGNOSIS — Z7901 Long term (current) use of anticoagulants: Secondary | ICD-10-CM | POA: Diagnosis not present

## 2023-09-11 DIAGNOSIS — B372 Candidiasis of skin and nail: Secondary | ICD-10-CM | POA: Diagnosis not present

## 2023-09-11 DIAGNOSIS — Z79899 Other long term (current) drug therapy: Secondary | ICD-10-CM | POA: Diagnosis not present

## 2023-09-11 DIAGNOSIS — I10 Essential (primary) hypertension: Secondary | ICD-10-CM | POA: Diagnosis not present

## 2023-09-11 DIAGNOSIS — Z87891 Personal history of nicotine dependence: Secondary | ICD-10-CM | POA: Diagnosis not present

## 2023-09-11 LAB — CEA: CEA: 3532 ng/mL — ABNORMAL HIGH (ref 0.0–4.7)

## 2023-09-11 MED ORDER — DIPHENOXYLATE-ATROPINE 2.5-0.025 MG PO TABS: 1.0000 | ORAL_TABLET | Freq: Four times a day (QID) | ORAL | 0 refills | Status: DC | PRN

## 2023-09-12 ENCOUNTER — Inpatient Hospital Stay: Payer: 59

## 2023-09-12 VITALS — BP 116/63 | HR 86 | Temp 97.9°F | Resp 16

## 2023-09-12 DIAGNOSIS — D6481 Anemia due to antineoplastic chemotherapy: Secondary | ICD-10-CM | POA: Diagnosis not present

## 2023-09-12 DIAGNOSIS — C20 Malignant neoplasm of rectum: Secondary | ICD-10-CM

## 2023-09-12 DIAGNOSIS — Z87891 Personal history of nicotine dependence: Secondary | ICD-10-CM | POA: Diagnosis not present

## 2023-09-12 DIAGNOSIS — I2510 Atherosclerotic heart disease of native coronary artery without angina pectoris: Secondary | ICD-10-CM | POA: Diagnosis not present

## 2023-09-12 DIAGNOSIS — Z5111 Encounter for antineoplastic chemotherapy: Secondary | ICD-10-CM | POA: Diagnosis not present

## 2023-09-12 DIAGNOSIS — E876 Hypokalemia: Secondary | ICD-10-CM | POA: Diagnosis not present

## 2023-09-12 DIAGNOSIS — Z9221 Personal history of antineoplastic chemotherapy: Secondary | ICD-10-CM | POA: Diagnosis not present

## 2023-09-12 DIAGNOSIS — E7800 Pure hypercholesterolemia, unspecified: Secondary | ICD-10-CM | POA: Diagnosis not present

## 2023-09-12 DIAGNOSIS — R600 Localized edema: Secondary | ICD-10-CM | POA: Diagnosis not present

## 2023-09-12 DIAGNOSIS — Z86711 Personal history of pulmonary embolism: Secondary | ICD-10-CM | POA: Diagnosis not present

## 2023-09-12 DIAGNOSIS — Z79899 Other long term (current) drug therapy: Secondary | ICD-10-CM | POA: Diagnosis not present

## 2023-09-12 DIAGNOSIS — C799 Secondary malignant neoplasm of unspecified site: Secondary | ICD-10-CM

## 2023-09-12 DIAGNOSIS — C7951 Secondary malignant neoplasm of bone: Secondary | ICD-10-CM | POA: Diagnosis not present

## 2023-09-12 DIAGNOSIS — I700 Atherosclerosis of aorta: Secondary | ICD-10-CM | POA: Diagnosis not present

## 2023-09-12 DIAGNOSIS — K5730 Diverticulosis of large intestine without perforation or abscess without bleeding: Secondary | ICD-10-CM | POA: Diagnosis not present

## 2023-09-12 DIAGNOSIS — I10 Essential (primary) hypertension: Secondary | ICD-10-CM | POA: Diagnosis not present

## 2023-09-12 DIAGNOSIS — K219 Gastro-esophageal reflux disease without esophagitis: Secondary | ICD-10-CM | POA: Diagnosis not present

## 2023-09-12 DIAGNOSIS — Z800 Family history of malignant neoplasm of digestive organs: Secondary | ICD-10-CM | POA: Diagnosis not present

## 2023-09-12 DIAGNOSIS — R634 Abnormal weight loss: Secondary | ICD-10-CM | POA: Diagnosis not present

## 2023-09-12 MED ORDER — SODIUM CHLORIDE 0.9% FLUSH
10.0000 mL | INTRAVENOUS | Status: DC | PRN
  Administered 2023-09-12: 10 mL
  Filled 2023-09-12: qty 10

## 2023-09-12 MED ORDER — HEPARIN SOD (PORK) LOCK FLUSH 100 UNIT/ML IV SOLN
500.0000 [IU] | Freq: Once | INTRAVENOUS | Status: AC | PRN
  Administered 2023-09-12: 500 [IU]
  Filled 2023-09-12: qty 5

## 2023-09-16 ENCOUNTER — Other Ambulatory Visit: Payer: Self-pay

## 2023-09-16 ENCOUNTER — Inpatient Hospital Stay: Payer: 59

## 2023-09-16 ENCOUNTER — Inpatient Hospital Stay
Admission: EM | Admit: 2023-09-16 | Discharge: 2023-09-20 | DRG: 389 | Disposition: A | Discharge status: Home Health | Payer: 59 | Attending: Internal Medicine | Admitting: Internal Medicine

## 2023-09-16 ENCOUNTER — Emergency Department: Payer: 59

## 2023-09-16 DIAGNOSIS — Z86718 Personal history of other venous thrombosis and embolism: Secondary | ICD-10-CM

## 2023-09-16 DIAGNOSIS — Z8249 Family history of ischemic heart disease and other diseases of the circulatory system: Secondary | ICD-10-CM | POA: Diagnosis not present

## 2023-09-16 DIAGNOSIS — C7951 Secondary malignant neoplasm of bone: Secondary | ICD-10-CM | POA: Diagnosis not present

## 2023-09-16 DIAGNOSIS — Z86711 Personal history of pulmonary embolism: Secondary | ICD-10-CM | POA: Diagnosis not present

## 2023-09-16 DIAGNOSIS — Z841 Family history of disorders of kidney and ureter: Secondary | ICD-10-CM | POA: Diagnosis not present

## 2023-09-16 DIAGNOSIS — N182 Chronic kidney disease, stage 2 (mild): Secondary | ICD-10-CM | POA: Diagnosis present

## 2023-09-16 DIAGNOSIS — Z882 Allergy status to sulfonamides status: Secondary | ICD-10-CM

## 2023-09-16 DIAGNOSIS — J449 Chronic obstructive pulmonary disease, unspecified: Secondary | ICD-10-CM | POA: Diagnosis present

## 2023-09-16 DIAGNOSIS — M1990 Unspecified osteoarthritis, unspecified site: Secondary | ICD-10-CM | POA: Diagnosis present

## 2023-09-16 DIAGNOSIS — Z87891 Personal history of nicotine dependence: Secondary | ICD-10-CM | POA: Diagnosis not present

## 2023-09-16 DIAGNOSIS — K56609 Unspecified intestinal obstruction, unspecified as to partial versus complete obstruction: Secondary | ICD-10-CM

## 2023-09-16 DIAGNOSIS — C20 Malignant neoplasm of rectum: Secondary | ICD-10-CM | POA: Diagnosis present

## 2023-09-16 DIAGNOSIS — E7800 Pure hypercholesterolemia, unspecified: Secondary | ICD-10-CM | POA: Diagnosis not present

## 2023-09-16 DIAGNOSIS — Z90710 Acquired absence of both cervix and uterus: Secondary | ICD-10-CM

## 2023-09-16 DIAGNOSIS — Z9049 Acquired absence of other specified parts of digestive tract: Secondary | ICD-10-CM

## 2023-09-16 DIAGNOSIS — Z85048 Personal history of other malignant neoplasm of rectum, rectosigmoid junction, and anus: Secondary | ICD-10-CM | POA: Diagnosis not present

## 2023-09-16 DIAGNOSIS — Z800 Family history of malignant neoplasm of digestive organs: Secondary | ICD-10-CM

## 2023-09-16 DIAGNOSIS — K219 Gastro-esophageal reflux disease without esophagitis: Secondary | ICD-10-CM | POA: Diagnosis present

## 2023-09-16 DIAGNOSIS — E785 Hyperlipidemia, unspecified: Secondary | ICD-10-CM | POA: Diagnosis present

## 2023-09-16 DIAGNOSIS — Z933 Colostomy status: Secondary | ICD-10-CM | POA: Diagnosis not present

## 2023-09-16 DIAGNOSIS — I2510 Atherosclerotic heart disease of native coronary artery without angina pectoris: Secondary | ICD-10-CM | POA: Diagnosis present

## 2023-09-16 DIAGNOSIS — Z4682 Encounter for fitting and adjustment of non-vascular catheter: Secondary | ICD-10-CM | POA: Diagnosis not present

## 2023-09-16 DIAGNOSIS — R590 Localized enlarged lymph nodes: Secondary | ICD-10-CM | POA: Diagnosis not present

## 2023-09-16 DIAGNOSIS — K565 Intestinal adhesions [bands], unspecified as to partial versus complete obstruction: Secondary | ICD-10-CM | POA: Diagnosis not present

## 2023-09-16 DIAGNOSIS — R918 Other nonspecific abnormal finding of lung field: Secondary | ICD-10-CM | POA: Diagnosis not present

## 2023-09-16 DIAGNOSIS — I129 Hypertensive chronic kidney disease with stage 1 through stage 4 chronic kidney disease, or unspecified chronic kidney disease: Secondary | ICD-10-CM | POA: Diagnosis not present

## 2023-09-16 DIAGNOSIS — Z9221 Personal history of antineoplastic chemotherapy: Secondary | ICD-10-CM

## 2023-09-16 DIAGNOSIS — E876 Hypokalemia: Secondary | ICD-10-CM | POA: Diagnosis not present

## 2023-09-16 DIAGNOSIS — Z7901 Long term (current) use of anticoagulants: Secondary | ICD-10-CM

## 2023-09-16 DIAGNOSIS — E871 Hypo-osmolality and hyponatremia: Secondary | ICD-10-CM | POA: Diagnosis present

## 2023-09-16 DIAGNOSIS — R066 Hiccough: Secondary | ICD-10-CM | POA: Diagnosis present

## 2023-09-16 DIAGNOSIS — R109 Unspecified abdominal pain: Secondary | ICD-10-CM | POA: Diagnosis not present

## 2023-09-16 DIAGNOSIS — Z79899 Other long term (current) drug therapy: Secondary | ICD-10-CM

## 2023-09-16 DIAGNOSIS — K5900 Constipation, unspecified: Secondary | ICD-10-CM | POA: Diagnosis present

## 2023-09-16 DIAGNOSIS — R140 Abdominal distension (gaseous): Secondary | ICD-10-CM | POA: Diagnosis not present

## 2023-09-16 DIAGNOSIS — C7800 Secondary malignant neoplasm of unspecified lung: Secondary | ICD-10-CM | POA: Diagnosis present

## 2023-09-16 DIAGNOSIS — C799 Secondary malignant neoplasm of unspecified site: Secondary | ICD-10-CM | POA: Diagnosis present

## 2023-09-16 DIAGNOSIS — J3089 Other allergic rhinitis: Secondary | ICD-10-CM

## 2023-09-16 LAB — CBC
HCT: 38.2 % (ref 36.0–46.0)
Hemoglobin: 12.3 g/dL (ref 12.0–15.0)
MCH: 29.6 pg (ref 26.0–34.0)
MCHC: 32.2 g/dL (ref 30.0–36.0)
MCV: 91.8 fL (ref 80.0–100.0)
Platelets: 218 10*3/uL (ref 150–400)
RBC: 4.16 MIL/uL (ref 3.87–5.11)
RDW: 15.8 % — ABNORMAL HIGH (ref 11.5–15.5)
WBC: 4.5 10*3/uL (ref 4.0–10.5)
nRBC: 0 % (ref 0.0–0.2)

## 2023-09-16 LAB — COMPREHENSIVE METABOLIC PANEL
ALT: 22 U/L (ref 0–44)
AST: 39 U/L (ref 15–41)
Albumin: 3.9 g/dL (ref 3.5–5.0)
Alkaline Phosphatase: 60 U/L (ref 38–126)
Anion gap: 11 (ref 5–15)
BUN: 12 mg/dL (ref 8–23)
CO2: 25 mmol/L (ref 22–32)
Calcium: 9 mg/dL (ref 8.9–10.3)
Chloride: 96 mmol/L — ABNORMAL LOW (ref 98–111)
Creatinine, Ser: 0.68 mg/dL (ref 0.44–1.00)
GFR, Estimated: >60 mL/min (ref >60)
Glucose, Bld: 143 mg/dL — ABNORMAL HIGH (ref 70–99)
Potassium: 3.3 mmol/L — ABNORMAL LOW (ref 3.5–5.1)
Sodium: 132 mmol/L — ABNORMAL LOW (ref 135–145)
Total Bilirubin: 0.7 mg/dL (ref <1.2)
Total Protein: 6.9 g/dL (ref 6.5–8.1)

## 2023-09-16 LAB — LIPASE, BLOOD: Lipase: 22 U/L (ref 11–51)

## 2023-09-16 LAB — URINALYSIS, ROUTINE W REFLEX MICROSCOPIC
Bacteria, UA: NONE SEEN
Bilirubin Urine: NEGATIVE
Glucose, UA: NEGATIVE mg/dL
Hgb urine dipstick: NEGATIVE
Ketones, ur: NEGATIVE mg/dL
Nitrite: NEGATIVE
Protein, ur: 30 mg/dL — AB
Specific Gravity, Urine: 1.041 — ABNORMAL HIGH (ref 1.005–1.030)
Squamous Epithelial / HPF: 0 /[HPF] (ref 0–5)
pH: 8 (ref 5.0–8.0)

## 2023-09-16 LAB — PHOSPHORUS: Phosphorus: 2.5 mg/dL (ref 2.5–4.6)

## 2023-09-16 LAB — MAGNESIUM: Magnesium: 1.7 mg/dL (ref 1.7–2.4)

## 2023-09-16 MED ORDER — IPRATROPIUM BROMIDE 0.03 % NA SOLN
1.0000 | Freq: Two times a day (BID) | NASAL | Status: DC | PRN
Start: 2023-09-16 — End: 2023-09-20

## 2023-09-16 MED ORDER — MORPHINE SULFATE (PF) 4 MG/ML IV SOLN
4.0000 mg | Freq: Once | INTRAVENOUS | Status: AC
  Administered 2023-09-16: 4 mg via INTRAVENOUS
  Filled 2023-09-16: qty 1

## 2023-09-16 MED ORDER — TRIAMCINOLONE ACETONIDE 0.5 % EX CREA: 1.0000 | TOPICAL_CREAM | Freq: Two times a day (BID) | CUTANEOUS | Status: DC | PRN

## 2023-09-16 MED ORDER — PANTOPRAZOLE SODIUM 40 MG IV SOLR
40.0000 mg | INTRAVENOUS | Status: DC
  Administered 2023-09-17 – 2023-09-18 (×3): 40 mg via INTRAVENOUS
  Filled 2023-09-16 (×4): qty 10

## 2023-09-16 MED ORDER — POTASSIUM CHLORIDE 10 MEQ/100ML IV SOLN
10.0000 meq | INTRAVENOUS | Status: AC
  Administered 2023-09-16 (×2): 10 meq via INTRAVENOUS
  Filled 2023-09-16 (×2): qty 100

## 2023-09-16 MED ORDER — ONDANSETRON HCL 4 MG/2ML IJ SOLN: 4.0000 mg | Freq: Four times a day (QID) | INTRAMUSCULAR | Status: DC | PRN

## 2023-09-16 MED ORDER — ONDANSETRON HCL 4 MG/2ML IJ SOLN
4.0000 mg | Freq: Once | INTRAMUSCULAR | Status: AC
  Administered 2023-09-16: 4 mg via INTRAVENOUS
  Filled 2023-09-16: qty 2

## 2023-09-16 MED ORDER — MAGNESIUM SULFATE 2 GM/50ML IV SOLN
2.0000 g | Freq: Once | INTRAVENOUS | Status: AC
  Administered 2023-09-16: 2 g via INTRAVENOUS
  Filled 2023-09-16: qty 50

## 2023-09-16 MED ORDER — FLUTICASONE PROPIONATE 50 MCG/ACT NA SUSP: 1.0000 | Freq: Every day | NASAL | Status: DC | PRN

## 2023-09-16 MED ORDER — MAGNESIUM SULFATE 50 % IJ SOLN
1.0000 g | Freq: Once | INTRAMUSCULAR | Status: DC
  Filled 2023-09-16 (×2): qty 2

## 2023-09-16 MED ORDER — ENOXAPARIN SODIUM 40 MG/0.4ML IJ SOSY
40.0000 mg | PREFILLED_SYRINGE | INTRAMUSCULAR | Status: DC
  Administered 2023-09-17 – 2023-09-18 (×3): 40 mg via SUBCUTANEOUS
  Filled 2023-09-16 (×2): qty 0.4

## 2023-09-16 MED ORDER — SODIUM CHLORIDE 0.9 % IV SOLN: INTRAVENOUS | Status: AC

## 2023-09-16 MED ORDER — HYDRALAZINE HCL 20 MG/ML IJ SOLN: 5.0000 mg | Freq: Four times a day (QID) | INTRAMUSCULAR | Status: DC | PRN

## 2023-09-16 MED ORDER — HYDROMORPHONE HCL 1 MG/ML IJ SOLN
0.5000 mg | INTRAMUSCULAR | Status: DC | PRN
  Administered 2023-09-16 – 2023-09-18 (×7): 0.5 mg via INTRAVENOUS
  Filled 2023-09-16 (×7): qty 0.5

## 2023-09-16 MED ORDER — IOHEXOL 300 MG/ML  SOLN
80.0000 mL | Freq: Once | INTRAMUSCULAR | Status: AC | PRN
  Administered 2023-09-16: 80 mL via INTRAVENOUS

## 2023-09-16 NOTE — ED Notes (Signed)
This RN to bedside to assess pt. MD had placed NG tube and didn't occlude the blue tube of the NG tube. Pt was saturated in bowl. This RN cleaned pt. New gown, new brief and new sheets.

## 2023-09-16 NOTE — ED Triage Notes (Addendum)
Pt comes with intense gas pains and bloating. Pt states belly pain since Friday. Pt has colostomy and has not had any output since Friday. Pt appears in pain and unable to sit still. Pt states pain all over belly and back. Pt states nausea and no appetite.   Pt is also cancer pt and currently receiving chem per family. Family states the pain just got worse last night.

## 2023-09-16 NOTE — ED Notes (Signed)
This RN to bedside to introduce self to pt and put NG tube supplies at the bedside for the MD. Pt is caox4, and in no acute distress. Family at bedside.

## 2023-09-16 NOTE — ED Notes (Signed)
Pt complaining of abd pain.

## 2023-09-16 NOTE — H&P (Signed)
History and Physical    Jacqueline Yoder AOZ:[id number] DOB: [birth year] DOA: 09/16/2023  PCP: Smitty Cords, DO (Confirm with patient/family/NH records and if not entered, this has to be entered at [facility] point of entry) Patient coming from: Home  I have personally briefly reviewed patient's old medical records in [facility]  Chief Complaint: Belly hurts  HPI: Jacqueline Yoder is a 77 y.o. female with medical history significant of HTN, HLD, rectal cancer stage IIIc status post partial colectomy and colostomy and posterior vaginectomy, palliative radiation therapy, recurrence in 2022 with lung metastasis on palliative FOLFIRI chemotherapy, DVT/PE on prophylactic Eliquis, presented with small bowel obstruction.  Patient started to notice decreased of colostomy output 3 to 4 days ago along with " bloated" in her abdomen with increasing feeling of nausea but no vomiting.  Stopped to pass flatus completely since he had tolerated and developed diffused abdominal pain and came to ED this morning.  Patient has been following with oncology for continueous palliative chemotherapy and last treatment with 1 week ago.  ED Course: Afebrile, none tachycardia nonhypotensive nonhypoxic.  CT abdomen pelvis showed SBO with transition point at anterior aspect of the lower abdominal left of midline.  K3.5 sodium 132.  Review of Systems: As per HPI otherwise 14 point review of systems negative.    Past Medical History:  Diagnosis Date   Allergy    Arthritis    Blood clot in vein    Family history of colon cancer    GERD (gastroesophageal reflux disease)    Hypercholesteremia    Hypertension    Hypertension    Lower extremity edema    Personal history of chemotherapy    Rectal cancer (HCC) 12/2018   Urinary incontinence     Past Surgical History:  Procedure Laterality Date   ABDOMINAL HYSTERECTOMY     CHOLECYSTECTOMY  1971   COLONOSCOPY WITH PROPOFOL N/A 12/03/2018   Procedure:  COLONOSCOPY WITH PROPOFOL;  Surgeon: Midge Minium, MD;  Location: [facility] ENDOSCOPY;  Service: Endoscopy;  Laterality: N/A;   COLONOSCOPY WITH PROPOFOL N/A 07/15/2020   Procedure: COLONOSCOPY WITH PROPOFOL;  Surgeon: Wyline Mood, MD;  Location: [facility] ENDOSCOPY;  Service: Gastroenterology;  Laterality: N/A;   FLEXIBLE SIGMOIDOSCOPY N/A 12/06/2018   Procedure: FLEXIBLE SIGMOIDOSCOPY;  Surgeon: Wyline Mood, MD;  Location: [facility] ENDOSCOPY;  Service: Endoscopy;  Laterality: N/A;   LAPAROSCOPIC COLOSTOMY  01/06/2019   PORTACATH PLACEMENT N/A 04/03/2019   Procedure: INSERTION PORT-A-CATH;  Surgeon: Leafy Ro, MD;  Location: [facility] ORS;  Service: General;  Laterality: N/A;     reports that she quit smoking about 45 years ago. Her smoking use included cigarettes. She has quit using smokeless tobacco. She reports that she does not drink alcohol and does not use drugs.  Allergies  Allergen Reactions   Sulfamethoxazole-Trimethoprim Other (See Comments)    Also said she had chills and pressure in nose.    Family History  Problem Relation Age of Onset   Colon cancer Brother 51       exposure to chemicals [place]   Hypertension Mother    Stroke Mother    Kidney failure Father    Breast cancer Neg Hx    Ovarian cancer Neg Hx      Prior to Admission medications   Medication Sig Start Date End Date Taking? Authorizing Provider  Cholecalciferol (VITAMIN D3) 2000 units capsule Take 2,000 Units by mouth daily.    [provider]  clindamycin (CLINDAGEL) 1 % gel Apply  topically 2 (two) times daily. 12/19/22   [name], MD  diclofenac sodium (VOLTAREN) 1 % GEL Apply 2 g topically 4 (four) times daily as needed (joint pain). 05/09/18   [provider]  diphenoxylate-atropine (LOMOTIL) 2.5-0.025 MG tablet Take 1 tablet by mouth 4 (four) times daily as needed for diarrhea or loose stools. 09/11/23   [name], MD  ELIQUIS 2.5 MG TABS tablet TAKE 1 TABLET BY MOUTH TWICE  DAILY 07/19/23   [name],  MD  fluticasone [facility]) 50 MCG/ACT nasal spray USE 1 SPRAY IN [facility] NOSTRIL ONCE DAILY 12/02/21   Althea Charon, Netta Neat, DO  gentamicin cream (GARAMYCIN) 0.1 % Apply to affected toe once daily. 08/03/23   Freddie Breech, DPM  ipratropium (ATROVENT) 0.03 % nasal spray USE 1 SPRAY IN BOTH NOSTRILS  TWICE DAILY 07/19/23   Karamalegos, Netta Neat, DO  ketoconazole (NIZORAL) 2 % cream Apply 1 Application topically daily. For up to 2 weeks. May repeat if need. 12/19/22   Karamalegos, Netta Neat, DO  lidocaine-prilocaine (EMLA) cream Apply 1 application. topically as needed. 03/01/22   [name], MD  loperamide (IMODIUM) 2 MG capsule Take 1 capsule (2 mg total) by mouth See admin instructions. With onset of loose stool, take 4mg  followed by 2mg  every 2 hours,  Maximum: 16 mg/day 04/14/21   [name], MD  loratadine (CLARITIN) 10 MG tablet Take 10 mg by mouth daily.    [provider]  magnesium chloride (SLOW-MAG) 64 MG TBEC SR tablet Take 1 tablet (64 mg total) by mouth 2 (two) times daily. 03/13/23   [name], MD  Multiple Vitamins-Minerals (ONE-A-DAY WOMENS 50 PLUS PO) Take 1 tablet by mouth daily.     [provider]  nystatin (MYCOSTATIN/NYSTOP) powder APPLY 1 POWDER TOPICALLY THREE TIMES DAILY 05/14/23   [name], MD  potassium chloride SA (KLOR-CON M) 20 MEQ tablet TAKE 1 TABLET BY MOUTH DAILY 06/08/23   [name], MD  simvastatin (ZOCOR) 40 MG tablet Take 1 tablet (40 mg total) by mouth at bedtime. 07/11/23   Karamalegos, Netta Neat, DO  triamcinolone cream (KENALOG) 0.5 % Apply 1 Application topically 2 (two) times daily. To affected areas, for up to 2 weeks. 11/17/22   Karamalegos, Netta Neat, DO  triamterene-hydrochlorothiazide (DYAZIDE) 37.5-25 MG capsule Take 1 each (1 capsule total) by mouth daily. 07/11/23   Karamalegos, Netta Neat, DO  zinc gluconate 50 MG tablet Take 50 mg by mouth daily.    [provider]  prochlorperazine (COMPAZINE) 10 MG tablet Take 1 tablet  (10 mg total) by mouth every 6 (six) hours as needed (NAUSEA). 04/14/21 05/29/22  [name], MD    Physical Exam: Vitals:   09/16/23 1157 09/16/23 1300 09/16/23 1400  BP: (!) 145/90 130/69 (!) 158/79  Pulse: 92 78 79  Resp: 18 18   Temp: 98.3 F (36.8 C)    SpO2: 96% 100% 100%    Constitutional: NAD, calm, comfortable Vitals:   09/16/23 1157 09/16/23 1300 09/16/23 1400  BP: (!) 145/90 130/69 (!) 158/79  Pulse: 92 78 79  Resp: 18 18   Temp: 98.3 F (36.8 C)    SpO2: 96% 100% 100%   Eyes: PERRL, lids and conjunctivae normal ENMT: Mucous membranes are dry. Posterior pharynx clear of any exudate or lesions.Normal dentition.  Neck: normal, supple, no masses, no thyromegaly Respiratory: clear to auscultation bilaterally, no wheezing, no crackles. Normal respiratory effort. No accessory muscle use.  Cardiovascular: Regular rate and rhythm, no murmurs /  rubs / gallops. No extremity edema. 2+ pedal pulses. No carotid bruits.  Abdomen: diffused tenderness abdominal, some guarding but no rebound, no masses palpated. No hepatosplenomegaly. Bowel sounds positive.  Musculoskeletal: no clubbing / cyanosis. No joint deformity upper and lower extremities. Good ROM, no contractures. Normal muscle tone.  Skin: no rashes, lesions, ulcers. No induration Neurologic: CN 2-12 grossly intact. Sensation intact, DTR normal. Strength 5/5 in all 4.  Psychiatric: Normal judgment and insight. Alert and oriented x 3. Normal mood.     Labs on Admission: I have personally reviewed following labs and imaging studies  CBC: Recent Labs  Lab 09/10/23 0827 09/16/23 1230  WBC 2.7* 4.5  NEUTROABS 1.2*  --   HGB 10.7* 12.3  HCT 33.9* 38.2  MCV 94.4 91.8  PLT 170 218   Basic Metabolic Panel: Recent Labs  Lab 09/10/23 0827 09/16/23 1230  NA 140 132*  K 4.0 3.3*  CL 103 96*  CO2 29 25  GLUCOSE 98 143*  BUN 17 12  CREATININE 0.79 0.68  CALCIUM 9.1 9.0  MG 1.8 1.7   GFR: Estimated Creatinine  Clearance: 59.1 mL/min (by C-G formula based on SCr of 0.68 mg/dL). Liver Function Tests: Recent Labs  Lab 09/10/23 0827 09/16/23 1230  AST 41 39  ALT 22 22  ALKPHOS 55 60  BILITOT 0.7 0.7  PROT 6.6 6.9  ALBUMIN 3.5 3.9   Recent Labs  Lab 09/16/23 1230  LIPASE 22   No results for input(s): "AMMONIA" in the last 168 hours. Coagulation Profile: No results for input(s): "INR", "PROTIME" in the last 168 hours. Cardiac Enzymes: No results for input(s): "CKTOTAL", "CKMB", "CKMBINDEX", "TROPONINI" in the last 168 hours. BNP (last 3 results) No results for input(s): "PROBNP" in the last 8760 hours. HbA1C: No results for input(s): "HGBA1C" in the last 72 hours. CBG: No results for input(s): "GLUCAP" in the last 168 hours. Lipid Profile: No results for input(s): "CHOL", "HDL", "LDLCALC", "TRIG", "CHOLHDL", "LDLDIRECT" in the last 72 hours. Thyroid Function Tests: No results for input(s): "TSH", "T4TOTAL", "FREET4", "T3FREE", "THYROIDAB" in the last 72 hours. Anemia Panel: No results for input(s): "VITAMINB12", "FOLATE", "FERRITIN", "TIBC", "IRON", "RETICCTPCT" in the last 72 hours. Urine analysis:    Component Value Date/Time   COLORURINE COLORLESS (A) 02/07/2021 1050   APPEARANCEUR CLEAR (A) 02/07/2021 1050   LABSPEC 1.002 (L) 02/07/2021 1050   PHURINE 9.0 (H) 02/07/2021 1050   GLUCOSEU NEGATIVE 02/07/2021 1050   HGBUR NEGATIVE 02/07/2021 1050   BILIRUBINUR NEGATIVE 02/07/2021 1050   BILIRUBINUR neg 03/29/2020 1213   KETONESUR NEGATIVE 02/07/2021 1050   PROTEINUR NEGATIVE 02/07/2021 1050   UROBILINOGEN 0.2 03/29/2020 1213   NITRITE NEGATIVE 02/07/2021 1050   LEUKOCYTESUR NEGATIVE 02/07/2021 1050    Radiological Exams on Admission: CT ABDOMEN PELVIS W CONTRAST  Result Date: 09/16/2023 CLINICAL DATA:  Abdominal pain and nausea EXAM: CT ABDOMEN AND PELVIS WITH CONTRAST TECHNIQUE: Multidetector CT imaging of the abdomen and pelvis was performed using the standard protocol  following bolus administration of intravenous contrast. RADIATION DOSE REDUCTION: This exam was performed according to the departmental dose-optimization program which includes automated exposure control, adjustment of the mA and/or kV according to patient size and/or use of iterative reconstruction technique. CONTRAST:  80mL OMNIPAQUE IOHEXOL 300 MG/ML  SOLN COMPARISON:  06/20/2023 FINDINGS: Lower chest: 1.8 x 1.4 cm right middle lobe nodule (series 4, image 16), not significantly changed in size. Additional scattered subcentimeter bibasilar nodules are also unchanged. Mild bibasilar subsegmental atelectasis. Circumferential thickening of  the distal esophagus. Hepatobiliary: Stable 8 mm subcapsular lesion in the anterior inferior right hepatic lobe (series 2, image 37). No new focal liver abnormality. Prior cholecystectomy with similar degree of biliary dilatation. Pancreas: Mild ductal dilatation without focal lesion or inflammatory changes. Spleen: Stable splenic cyst.  No splenomegaly. Adrenals/Urinary Tract: Unremarkable adrenal glands. Multiple bilateral renal cysts which require no follow-up imaging. No renal stone or hydronephrosis. Urinary bladder within normal limits. Stomach/Bowel: Prior colon resection with left anterior abdominal wall colostomy. Adjacent parastomal hernia. Small volume fluid within the hernia sac. Stomach is distended. Multiple dilated loops of small bowel throughout the abdomen with transition point in the anterior aspect of the lower abdomen, left of midline (series 2, image 54). Normal appendix in the right lower quadrant. Vascular/Lymphatic: Aortoiliac atherosclerosis. Enlarged right external iliac chain lymph node measuring 1.4 cm (series 2, image 68), previously 1.3 cm. Several mildly prominent retroperitoneal lymph nodes have increased in size from prior but remain subcentimeter in size. Reproductive: Status post hysterectomy. No adnexal masses. Other: Small volume ascites.  No  pneumoperitoneum. Musculoskeletal: Permeative metastatic lesion the left iliac wing not appreciably changed. No pathologic fracture. No new bony lesion identified. IMPRESSION: 1. Small-bowel obstruction with transition point in the anterior aspect of the lower abdomen, left of midline. This may be secondary to adhesions. 2. Small volume ascites. 3. Prior colon resection with left anterior abdominal wall colostomy. Adjacent parastomal hernia. Small volume fluid within the hernia sac. 4. Stable pulmonary metastatic disease. 5. Stable metastatic lesion of the left iliac wing. 6. Enlarged right external iliac chain lymph node measuring 1.4 cm, previously 1.3 cm. Several mildly prominent retroperitoneal lymph nodes have increased in size from prior but remain subcentimeter in size. 7. Aortic atherosclerosis (ICD10-I70.0). Electronically Signed   By: Duanne Guess D.O.   On: 09/16/2023 14:07    EKG: None  Assessment/Plan Principal Problem:   SBO (small bowel obstruction) (HCC)  (please populate well all problems here in Problem List. (For example, if patient is on BP meds at home and you resume or decide to hold them, it is a problem that needs to be her. Same for CAD, COPD, HLD and so on)  SBO -Likely secondary to bowel adhesions from previous surgeries -ED patient discussed case with general surgeon who recommend conservative management for now.  As patient does not nausea anymore and no previous vomiting at home either, surgeon recommend probably ok to monitor progress clinically without NGT at this point. -N.p.o., IV fluid -Dilaudid and Zofran as needed for pain and nausea -Daily image  Hypokalemia -IV replacement -Also received 1 g of IV magnesium for magnesium= 1.7  HTN -Hold off hydrochlorothiazide, start PRN hydralazine  HLD -Hold off statin  History of PE and DVT -Switch prophylactic dosage of Eliquis to Lovenox subcu in case patient will need surgical intervention  Stage IV  recurrent rectal cancer -Outpatient follow-up with oncology for continue with palliative chemotherapy  DVT prophylaxis: Lovenox subcu Code Status: Full code Family Communication: Daughter at bedside Disposition Plan: Patient is sick with acute SBO requiring close inpatient monitoring and inpatient surgical consultation, expect more than 2 midnight hospital stay. Consults called: General surgeon Admission status: MedSurg admit   Emeline General MD [facility] Pager [phone]  09/16/2023, 3:05 PM

## 2023-09-16 NOTE — Consult Note (Signed)
Patient ID: Jacqueline Yoder, female   DOB: [birth year], 77 y.o.   MRN: [id number] CC: SBO History of Present Illness Jacqueline Yoder is a 77 y.o. female with PMH of rectal cancer s/P TNT and APR now on palliative chemotherapy with known bony mets who presents with one day history of abdominal pain, distention, and nausea. She says she was having normal bowel function through stoma on Friday. However, yesterday, she started to have icnreased abdominal pain that was associated with nausea. She also reports taht she had less gas out from her stoma. The pain was centered just around her ostoma with radiation throughout the abdomen and described as sharp in nature. She denies any fevers or chills. Her last chemotherapy infusion was 6 days ago. .  Past Medical History Past Medical History:  Diagnosis Date   Allergy    Arthritis    Blood clot in vein    Family history of colon cancer    GERD (gastroesophageal reflux disease)    Hypercholesteremia    Hypertension    Hypertension    Lower extremity edema    Personal history of chemotherapy    Rectal cancer (HCC) 12/2018   Urinary incontinence        Past Surgical History:  Procedure Laterality Date   ABDOMINAL HYSTERECTOMY     CHOLECYSTECTOMY  1971   COLONOSCOPY WITH PROPOFOL N/A 12/03/2018   Procedure: COLONOSCOPY WITH PROPOFOL;  Surgeon: Midge Minium, MD;  Location: [facility] ENDOSCOPY;  Service: Endoscopy;  Laterality: N/A;   COLONOSCOPY WITH PROPOFOL N/A 07/15/2020   Procedure: COLONOSCOPY WITH PROPOFOL;  Surgeon: Wyline Mood, MD;  Location: [facility] ENDOSCOPY;  Service: Gastroenterology;  Laterality: N/A;   FLEXIBLE SIGMOIDOSCOPY N/A 12/06/2018   Procedure: FLEXIBLE SIGMOIDOSCOPY;  Surgeon: Wyline Mood, MD;  Location: [facility] ENDOSCOPY;  Service: Endoscopy;  Laterality: N/A;   LAPAROSCOPIC COLOSTOMY  01/06/2019   PORTACATH PLACEMENT N/A 04/03/2019   Procedure: INSERTION PORT-A-CATH;  Surgeon: Leafy Ro, MD;  Location: [facility] ORS;  Service: General;   Laterality: N/A;    Allergies  Allergen Reactions   Sulfamethoxazole-Trimethoprim Other (See Comments)    Also said she had chills and pressure in nose.    Current Facility-Administered Medications  Medication Dose Route Frequency Provider Last Rate Last Admin   0.9 %  sodium chloride infusion   Intravenous Continuous Mikey College T, MD       enoxaparin (LOVENOX) injection 40 mg  40 mg Subcutaneous Q24H Mikey College T, MD       fluticasone (FLONASE) 50 MCG/ACT nasal spray 1 spray  1 spray Each Nare Daily Mikey College T, MD       hydrALAZINE (APRESOLINE) injection 5 mg  5 mg Intravenous Q6H PRN Mikey College T, MD       HYDROmorphone (DILAUDID) injection 0.5 mg  0.5 mg Intravenous Q4H PRN Mikey College T, MD       ipratropium (ATROVENT) 0.03 % nasal spray 1 spray  1 spray Each Nare BID Mikey College T, MD       magnesium sulfate (IV Push/IM) injection 1 g  1 g Intravenous Once Mikey College T, MD       ondansetron [facility]) injection 4 mg  4 mg Intravenous Q6H PRN Mikey College T, MD       pantoprazole (PROTONIX) injection 40 mg  40 mg Intravenous Q24H Mikey College T, MD       potassium chloride 10 mEq in 100 mL IVPB  10 mEq Intravenous Q1 Hr x 2 Zhang,  Renae Fickle, MD       triamcinolone cream (KENALOG) 0.5 % 1 Application  1 Application Topical BID Emeline General, MD       Current Outpatient Medications  Medication Sig Dispense Refill   Cholecalciferol (VITAMIN D3) 2000 units capsule Take 2,000 Units by mouth daily.     clindamycin (CLINDAGEL) 1 % gel Apply topically 2 (two) times daily. 30 g 5   diclofenac sodium (VOLTAREN) 1 % GEL Apply 2 g topically 4 (four) times daily as needed (joint pain).  11   diphenoxylate-atropine (LOMOTIL) 2.5-0.025 MG tablet Take 1 tablet by mouth 4 (four) times daily as needed for diarrhea or loose stools. 120 tablet 0   ELIQUIS 2.5 MG TABS tablet TAKE 1 TABLET BY MOUTH TWICE  DAILY 200 tablet 2   fluticasone (FLONASE) 50 MCG/ACT nasal spray USE 1 SPRAY IN EACH NOSTRIL ONCE  DAILY 16 g 3   gentamicin cream (GARAMYCIN) 0.1 % Apply to affected toe once daily. 30 g 1   ipratropium (ATROVENT) 0.03 % nasal spray USE 1 SPRAY IN BOTH NOSTRILS  TWICE DAILY 60 mL 2   ketoconazole (NIZORAL) 2 % cream Apply 1 Application topically daily. For up to 2 weeks. May repeat if need. 30 g 2   lidocaine-prilocaine (EMLA) cream Apply 1 application. topically as needed. 30 g 6   loperamide (IMODIUM) 2 MG capsule Take 1 capsule (2 mg total) by mouth See admin instructions. With onset of loose stool, take 4mg  followed by 2mg  every 2 hours,  Maximum: 16 mg/day 120 capsule 1   loratadine (CLARITIN) 10 MG tablet Take 10 mg by mouth daily.     magnesium chloride (SLOW-MAG) 64 MG TBEC SR tablet Take 1 tablet (64 mg total) by mouth 2 (two) times daily. 60 tablet 2   Multiple Vitamins-Minerals (ONE-A-DAY WOMENS 50 PLUS PO) Take 1 tablet by mouth daily.      nystatin (MYCOSTATIN/NYSTOP) powder APPLY 1 POWDER TOPICALLY THREE TIMES DAILY 45 g 0   potassium chloride SA (KLOR-CON M) 20 MEQ tablet TAKE 1 TABLET BY MOUTH DAILY 100 tablet 2   simvastatin (ZOCOR) 40 MG tablet Take 1 tablet (40 mg total) by mouth at bedtime. 100 tablet 3   triamcinolone cream (KENALOG) 0.5 % Apply 1 Application topically 2 (two) times daily. To affected areas, for up to 2 weeks. 30 g 2   triamterene-hydrochlorothiazide (DYAZIDE) 37.5-25 MG capsule Take 1 each (1 capsule total) by mouth daily. 100 capsule 3   zinc gluconate 50 MG tablet Take 50 mg by mouth daily.     Facility-Administered Medications Ordered in Other Encounters  Medication Dose Route Frequency Provider Last Rate Last Admin   heparin lock flush 100 UNIT/ML injection             Family History Family History  Problem Relation Age of Onset   Colon cancer Brother 52       exposure to chemicals [place]   Hypertension Mother    Stroke Mother    Kidney failure Father    Breast cancer Neg Hx    Ovarian cancer Neg Hx        Social History Social  History   Tobacco Use   Smoking status: Former    Current packs/day: 0.00    Types: Cigarettes    Quit date: 12/02/1977    Years since quitting: 45.8   Smokeless tobacco: Former  Building services engineer status: Never Used  Substance Use Topics   Alcohol  use: Never   Drug use: Never        ROS Full ROS of systems performed and is otherwise negative there than what is stated in the HPI  Physical Exam Blood pressure (!) 158/79, pulse 79, temperature 98.3 F (36.8 C), resp. rate 18, SpO2 100%.  CONSTITUTIONAL: Frail appaering, but alert and oriented EYES: Pupils equal, round, and reactive to light, Sclera non-icteric. EARS, NOSE, MOUTH AND THROAT: The oropharynx is clear. Hearing is intact to voice.  NECK: Trachea is midline, and there is no jugular venous distension.  RESPIRATORY:  NWOB on RA  CARDIOVASCULAR: Heart is regular without murmurs, gallops, or rubs. GI: The abdomen is distended, tympanic with some tenderness in LUQ and LLQ. She has a stoma in place without any air or gas in bag. She has well healed surgical scars.     Data Reviewed I have reivewed her oncologic hx as described above. I have reviewed her labs today. They are notable for maintained kidney fucntion. She has a normal wbc, up slightly from 6 days ago when she was leukopenic.  CT scan notable for dilated stomach, with dilated loops of bowel with a transition point in LLQ. There is some free fluid in abdomen  I have personally reviewed the patient's imaging and medical records.    Assessment    18F with rectal cancer s/p APR on palliative chemotherapy with known bony mets seen in consultation for one day history of abdominal pain and obstipation. CT scan concernign for SBO  Plan    I discussed with patient the likely etiology of obstruction as adhesive disease. I also discussed with her that we will first try to treat this non-operatively. Given the size of her stomach on CT and her hiccuping during exam I  think she would benefit from NGT. If this does not work then in next 24-48 hours we can attempt gastrograffin challenge. I also discussed with her if these fail to improve her obstruction then we may need operative intervention. This would ultimately delay her ability to receive chemotherapy. Appreciate hospitalist assistance, continue resuscitation and IVF    Kandis Cocking 09/16/2023, 3:20 PM

## 2023-09-16 NOTE — ED Provider Notes (Signed)
[facility] Provider Note    Event Date/Time   First MD Initiated Contact with Patient 09/16/23 1200     (approximate)   History   Abdominal Pain   HPI Jacqueline Yoder is a 77 y.o. female with history of rectal and colon cancer, stage III, PE on Eliquis, presenting today for abdominal pain.  Patient presents with family members who provide most of the history given her acute pain.  They states she has had worsening gas pain that started yesterday and now severe abdominal pain.  She has had nausea without vomiting.  Denies fevers, chills, chest pain, shortness of breath.  Patient has not had a bowel movement in 4 days.  Actively on chemotherapy with last treatment last week.  Not able to tolerate any p.o. over the past 24 hours.  Reviewed most recent oncology notes with ongoing treatment of her rectal and colon cancer with chemotherapy.     Physical Exam   Triage Vital Signs: ED Triage Vitals  Encounter Vitals Group     BP 09/16/23 1157 (!) 145/90     Systolic BP Percentile --      Diastolic BP Percentile --      Pulse Rate 09/16/23 1157 92     Resp 09/16/23 1157 18     Temp 09/16/23 1157 98.3 F (36.8 C)     Temp src --      SpO2 09/16/23 1157 96 %     Weight --      Height --      Head Circumference --      Peak Flow --      Pain Score 09/16/23 1156 10     Pain Loc --      Pain Education --      Exclude from Growth Chart --     Most recent vital signs: Vitals:   09/16/23 1300 09/16/23 1400  BP: 130/69 (!) 158/79  Pulse: 78 79  Resp: 18   Temp:    SpO2: 100% 100%   Physical Exam: I have reviewed the vital signs and nursing notes. General: Awake, alert, uncomfortable and in pain. Head:  Atraumatic, normocephalic.   ENT:  EOM intact, PERRL. Oral mucosa is pink and moist with no lesions. Neck: Neck is supple with full range of motion, No meningeal signs. Cardiovascular:  RRR, No murmurs. Peripheral pulses palpable and equal  bilaterally. Respiratory:  Symmetrical chest wall expansion.  No rhonchi, rales, or wheezes.  Good air movement throughout.  No use of accessory muscles.   Musculoskeletal:  No cyanosis or edema. Moving extremities with full ROM Abdomen:  Soft, tender to palpation throughout the entire abdomen but most prominent in the epigastric region, Colostomy site in place with no output, nondistended. Neuro:  GCS 15, moving all four extremities, interacting appropriately. Speech clear. Psych:  Calm, appropriate.   Skin:  Warm, dry, no rash.    ED Results / Procedures / Treatments   Labs (all labs ordered are listed, but only abnormal results are displayed) Labs Reviewed  COMPREHENSIVE METABOLIC PANEL - Abnormal; Notable for the following components:      Result Value   Sodium 132 (*)    Potassium 3.3 (*)    Chloride 96 (*)    Glucose, Bld 143 (*)    All other components within normal limits  CBC - Abnormal; Notable for the following components:   RDW 15.8 (*)    All other components within normal limits  LIPASE,  BLOOD  MAGNESIUM  URINALYSIS, ROUTINE W REFLEX MICROSCOPIC     EKG    RADIOLOGY Independently interpreted CT imaging showing evidence of small bowel obstruction   PROCEDURES:  Critical Care performed: No  Procedures   MEDICATIONS ORDERED IN ED: Medications  morphine (PF) 4 MG/ML injection 4 mg (4 mg Intravenous Given 09/16/23 1231)  ondansetron (ZOFRAN) injection 4 mg (4 mg Intravenous Given 09/16/23 1231)  iohexol (OMNIPAQUE) 300 MG/ML solution 80 mL (80 mLs Intravenous Contrast Given 09/16/23 1333)     IMPRESSION / MDM / ASSESSMENT AND PLAN / ED COURSE  I reviewed the triage vital signs and the nursing notes.                              Differential diagnosis includes, but is not limited to, SBO, viral gastroenteritis, chemotherapy-induced nausea and abdominal pain, diverticulitis, pancreatitis  Patient's presentation is most consistent with acute presentation  with potential threat to life or bodily function.  Patient is a 77 year old female presenting today for severe abdominal pain over the past 24 hours with associated nausea.  Given history of rectal and colon cancer with colostomy in place and no bowel movements in 4 days, concern for SBO or other acute intra-abdominal infection.  Will get laboratory workup as well as CT abdomen/pelvis for further evaluation.  Laboratory workup largely reassuring.  CT abdomen/pelvis shows evidence of small bowel obstruction.  Reassessed patient with improvement in pain and nausea symptoms.  Discussed the case with Dr. Maurine Minister with general surgery.  Agrees with admission to hospitalist and no further intervention at this time while medically managing small bowel obstruction.  Hospitalist is agreed to admit patient for further care.  The patient is on the cardiac monitor to evaluate for evidence of arrhythmia and/or significant heart rate changes. Clinical Course as of 09/16/23 1443  Sun Sep 16, 2023  1410 CT ABDOMEN PELVIS W CONTRAST Small-bowel obstruction with transition point in the anterior aspect of the lower abdomen, left of midline. This may be secondary to adhesions. [DW]  1410 Gen surg c/s [DW]    Clinical Course User Index [DW] Janith Lima, MD     FINAL CLINICAL IMPRESSION(S) / ED DIAGNOSES   Final diagnoses:  Small bowel obstruction due to adhesions (HCC)  Hypokalemia     Rx / DC Orders   ED Discharge Orders     None        Note:  This document was prepared using Dragon voice recognition software and may include unintentional dictation errors.   Janith Lima, MD 09/16/23 [phone]

## 2023-09-17 ENCOUNTER — Encounter: Payer: Self-pay | Admitting: Internal Medicine

## 2023-09-17 ENCOUNTER — Ambulatory Visit: Admission: RE | Admit: 2023-09-17 | Payer: 59 | Source: Ambulatory Visit

## 2023-09-17 ENCOUNTER — Other Ambulatory Visit: Payer: 59

## 2023-09-17 DIAGNOSIS — K565 Intestinal adhesions [bands], unspecified as to partial versus complete obstruction: Principal | ICD-10-CM

## 2023-09-17 DIAGNOSIS — K56609 Unspecified intestinal obstruction, unspecified as to partial versus complete obstruction: Secondary | ICD-10-CM | POA: Diagnosis not present

## 2023-09-17 LAB — BASIC METABOLIC PANEL
Anion gap: 8 (ref 5–15)
BUN: 11 mg/dL (ref 8–23)
CO2: 27 mmol/L (ref 22–32)
Calcium: 8.6 mg/dL — ABNORMAL LOW (ref 8.9–10.3)
Chloride: 98 mmol/L (ref 98–111)
Creatinine, Ser: 0.78 mg/dL (ref 0.44–1.00)
GFR, Estimated: >60 mL/min (ref >60)
Glucose, Bld: 109 mg/dL — ABNORMAL HIGH (ref 70–99)
Potassium: 3.7 mmol/L (ref 3.5–5.1)
Sodium: 133 mmol/L — ABNORMAL LOW (ref 135–145)

## 2023-09-17 LAB — CBC
HCT: 36.2 % (ref 36.0–46.0)
Hemoglobin: 11.9 g/dL — ABNORMAL LOW (ref 12.0–15.0)
MCH: 29.5 pg (ref 26.0–34.0)
MCHC: 32.9 g/dL (ref 30.0–36.0)
MCV: 89.8 fL (ref 80.0–100.0)
Platelets: 194 10*3/uL (ref 150–400)
RBC: 4.03 MIL/uL (ref 3.87–5.11)
RDW: 15.8 % — ABNORMAL HIGH (ref 11.5–15.5)
WBC: 3.1 10*3/uL — ABNORMAL LOW (ref 4.0–10.5)
nRBC: 0 % (ref 0.0–0.2)

## 2023-09-17 MED ORDER — LACTATED RINGERS IV SOLN: INTRAVENOUS | Status: AC

## 2023-09-17 NOTE — Progress Notes (Signed)
  Progress Note   Patient: Jacqueline Yoder UEA:[id number] DOB: September 05, 1946 DOA: 09/16/2023     1 DOS: the patient was seen and examined on 09/17/2023   Brief hospital course: "[name] is a 77 y.o. female with medical history significant of HTN, HLD, rectal cancer stage IIIc status post partial colectomy and colostomy and posterior vaginectomy, palliative radiation therapy, recurrence in 2022 with lung metastasis on palliative FOLFIRI chemotherapy, DVT/PE on prophylactic Eliquis, presented with small bowel obstruction. " See H&P for full HPI on admission & ED course.  Pt was admitted to hospitalist service with general surgery consulted.  NG tube was placed to wall suction and patient kept NPO.    Further hospital course and management as outlined below.   Assessment and Plan:   SBO General surgery following NG tube placed 12/1, to wall suction NPO IV fluids - LR @ 75 cc/hr Up in chair / ambulate as tolerated -- PT evaluation Gastrograffin challenge Wednesday if bowel function not yet returned Monitor volume status & electrolytes    Hypokalemia POA, resolved with replacement Monitor BMP, Mg level    HTN Holding home hydrochlorothiazide PRN hydralazine while NPO   HLD Holding statin while NPO   History of PE and DVT Holding Eliquis while NPO & in case of need for surgery Lovenox for VTE prophylaxis   Stage IV recurrent rectal cancer -Outpatient follow-up with oncology for continue with palliative chemotherapy       Subjective: Pt seen with daughter at bedside today, in ED holding for a bed.  Pt reports recurrent abdominal pain when pain medication wears off.  Otherwise feels okay.     Physical Exam: Vitals:   09/17/23 0830 09/17/23 0930 09/17/23 1055 09/17/23 1202  BP: 126/64 (!) 135/59  (!) 119/57  Pulse: 67 75  76  Resp: 17 18  18   Temp:   (!) 100.4 F (38 C) 98.2 F (36.8 C)  TempSrc:   Oral Oral  SpO2: 99% 99%  99%   General exam: awake, alert, no  acute distress HEENT: NG tube in place to wall suction, moist mucus membranes, hearing grossly normal  Respiratory system: CTAB, no wheezes, rales or rhonchi, normal respiratory effort. Cardiovascular system: normal S1/S2, RRR, no pedal edema.   Gastrointestinal system: distended, colostomy bag with no air or stool Central nervous system: A&O x 3. no gross focal neurologic deficits, normal speech Extremities: moves all, no edema, normal tone Skin: dry, intact, normal temperature Psychiatry: normal mood, congruent affect, judgement and insight appear normal   Data Reviewed:  Notable labs ---  Na 133, glucose 109, Ca 8.6, WBC 3.1, Hbg 11.9  Family Communication: daughter at bedside on rounds  Disposition: Status is: Inpatient Remains inpatient appropriate because: persistent SBO   Planned Discharge Destination: Home    Time spent: 42 minutes  Author: Pennie Banter, DO 09/17/2023 4:43 PM  For on call review [email or web address].

## 2023-09-17 NOTE — ED Notes (Signed)
MD notified of elevated temperature finding by RN.

## 2023-09-17 NOTE — ED Notes (Signed)
Daughter Debbe Odea # [phone]

## 2023-09-17 NOTE — Plan of Care (Signed)
Patient arrived to unit from ED with vss, no c/o pain, NGT to LIS, educated in call bell use, call don't fall protocol and voiced understanding.  Problem: Education: Goal: Knowledge of General Education information will improve Description: Including pain rating scale, medication(s)/side effects and non-pharmacologic comfort measures Outcome: Progressing   Problem: Health Behavior/Discharge Planning: Goal: Ability to manage health-related needs will improve Outcome: Progressing   Problem: Clinical Measurements: Goal: Ability to maintain clinical measurements within normal limits will improve Outcome: Progressing Goal: Will remain free from infection Outcome: Progressing Goal: Diagnostic test results will improve Outcome: Progressing Goal: Respiratory complications will improve Outcome: Progressing Goal: Cardiovascular complication will be avoided Outcome: Progressing   Problem: Activity: Goal: Risk for activity intolerance will decrease Outcome: Progressing   Problem: Nutrition: Goal: Adequate nutrition will be maintained Outcome: Progressing   Problem: Coping: Goal: Level of anxiety will decrease Outcome: Progressing   Problem: Elimination: Goal: Will not experience complications related to bowel motility Outcome: Progressing Goal: Will not experience complications related to urinary retention Outcome: Progressing   Problem: Pain Management: Goal: General experience of comfort will improve Outcome: Progressing   Problem: Safety: Goal: Ability to remain free from injury will improve Outcome: Progressing   Problem: Skin Integrity: Goal: Risk for impaired skin integrity will decrease Outcome: Progressing

## 2023-09-17 NOTE — Progress Notes (Addendum)
CC: SBO Subjective: The patient was admitted yesterday with a small bowel obstruction.  An NG tube was placed by me.  It is putting out feculent output.  The patient reports that she feels better this morning.  She denies any air or stool in her stoma.  Her pain is improved.  Objective: Vital signs in last 24 hours: Temp:  [98.2 F (36.8 C)-100.4 F (38 C)] 98.2 F (36.8 C) (12/02 1202) Pulse Rate:  [67-96] 76 (12/02 1202) Resp:  [16-21] 18 (12/02 1202) BP: (114-152)/(53-80) 119/57 (12/02 1202) SpO2:  [96 %-100 %] 99 % (12/02 1202)    Intake/Output from previous day: 12/01 0701 - 12/02 0700 In: -  Out: 1000  Intake/Output this shift: No intake/output data recorded.  Physical exam:  Ng Tube with feculent output.  Abdomen is soft, distended with tympany.  Her stoma is pink but there is no air or gas in the bag.  Lab Results: CBC  Recent Labs    09/16/23 1230 09/17/23 0540  WBC 4.5 3.1*  HGB 12.3 11.9*  HCT 38.2 36.2  PLT 218 194   BMET Recent Labs    09/16/23 1230 09/17/23 0540  NA 132* 133*  K 3.3* 3.7  CL 96* 98  CO2 25 27  GLUCOSE 143* 109*  BUN 12 11  CREATININE 0.68 0.78  CALCIUM 9.0 8.6*   PT/INR No results for input(s): "LABPROT", "INR" in the last 72 hours. ABG No results for input(s): "PHART", "HCO3" in the last 72 hours.  Invalid input(s): "PCO2", "PO2"  Studies/Results: DG Abd 1 View  Result Date: 09/16/2023 CLINICAL DATA:  X-ray for NG tube placement EXAM: ABDOMEN - 1 VIEW limited for tube placement COMPARISON:  CT earlier 09/16/2023 FINDINGS: Limited x-ray of the upper abdomen demonstrates enteric tube with tip overlying the midbody of the stomach. Surgical changes in the midabdomen. Overlapping cardiac leads. IMPRESSION: Limited x-ray for tube placement has a enteric tube with tip overlying the midbody of the stomach. Electronically Signed   By: Karen Kays M.D.   On: 09/16/2023 18:09   CT ABDOMEN PELVIS W CONTRAST  Result Date:  09/16/2023 CLINICAL DATA:  Abdominal pain and nausea EXAM: CT ABDOMEN AND PELVIS WITH CONTRAST TECHNIQUE: Multidetector CT imaging of the abdomen and pelvis was performed using the standard protocol following bolus administration of intravenous contrast. RADIATION DOSE REDUCTION: This exam was performed according to the departmental dose-optimization program which includes automated exposure control, adjustment of the mA and/or kV according to patient size and/or use of iterative reconstruction technique. CONTRAST:  80mL OMNIPAQUE IOHEXOL 300 MG/ML  SOLN COMPARISON:  06/20/2023 FINDINGS: Lower chest: 1.8 x 1.4 cm right middle lobe nodule (series 4, image 16), not significantly changed in size. Additional scattered subcentimeter bibasilar nodules are also unchanged. Mild bibasilar subsegmental atelectasis. Circumferential thickening of the distal esophagus. Hepatobiliary: Stable 8 mm subcapsular lesion in the anterior inferior right hepatic lobe (series 2, image 37). No new focal liver abnormality. Prior cholecystectomy with similar degree of biliary dilatation. Pancreas: Mild ductal dilatation without focal lesion or inflammatory changes. Spleen: Stable splenic cyst.  No splenomegaly. Adrenals/Urinary Tract: Unremarkable adrenal glands. Multiple bilateral renal cysts which require no follow-up imaging. No renal stone or hydronephrosis. Urinary bladder within normal limits. Stomach/Bowel: Prior colon resection with left anterior abdominal wall colostomy. Adjacent parastomal hernia. Small volume fluid within the hernia sac. Stomach is distended. Multiple dilated loops of small bowel throughout the abdomen with transition point in the anterior aspect of the lower abdomen, left  of midline (series 2, image 54). Normal appendix in the right lower quadrant. Vascular/Lymphatic: Aortoiliac atherosclerosis. Enlarged right external iliac chain lymph node measuring 1.4 cm (series 2, image 68), previously 1.3 cm. Several mildly  prominent retroperitoneal lymph nodes have increased in size from prior but remain subcentimeter in size. Reproductive: Status post hysterectomy. No adnexal masses. Other: Small volume ascites.  No pneumoperitoneum. Musculoskeletal: Permeative metastatic lesion the left iliac wing not appreciably changed. No pathologic fracture. No new bony lesion identified. IMPRESSION: 1. Small-bowel obstruction with transition point in the anterior aspect of the lower abdomen, left of midline. This may be secondary to adhesions. 2. Small volume ascites. 3. Prior colon resection with left anterior abdominal wall colostomy. Adjacent parastomal hernia. Small volume fluid within the hernia sac. 4. Stable pulmonary metastatic disease. 5. Stable metastatic lesion of the left iliac wing. 6. Enlarged right external iliac chain lymph node measuring 1.4 cm, previously 1.3 cm. Several mildly prominent retroperitoneal lymph nodes have increased in size from prior but remain subcentimeter in size. 7. Aortic atherosclerosis (ICD10-I70.0). Electronically Signed   By: Duanne Guess D.O.   On: 09/16/2023 14:07    Anti-infectives: Anti-infectives (From admission, onward)    None       Assessment/Plan:  Jacqueline Yoder is a 77 year old who has advanced rectal cancer.  She has known bony metastases and is on palliative chemotherapy with her last dose being 7 days ago.  She presented to the hospital with 1 day history of abdominal pain and had a CT scan that was concerning for small bowel obstruction.  An NG tube was placed in the ED with feculent output.  She is not having bowel function as of yet.  I recommend continued decompression with NG tube.  Monitor for bowel function.  She needs to get out of bed and sit up in chair or even better ambulate.  I discussed with her yesterday that my hope is that we will be able to resolve this nonoperatively with NG tube decompression.  If she is not able to have bowel function by Wednesday would  get a Gastrografin challenge then.  We will order a KUB for the morning. NGT to LIWS, NPO, Fluids   Baker Pierini, M.D. [facility]

## 2023-09-18 ENCOUNTER — Inpatient Hospital Stay: Payer: 59

## 2023-09-18 ENCOUNTER — Encounter: Payer: Self-pay | Admitting: Oncology

## 2023-09-18 DIAGNOSIS — C20 Malignant neoplasm of rectum: Secondary | ICD-10-CM

## 2023-09-18 DIAGNOSIS — K56609 Unspecified intestinal obstruction, unspecified as to partial versus complete obstruction: Secondary | ICD-10-CM | POA: Diagnosis not present

## 2023-09-18 DIAGNOSIS — K565 Intestinal adhesions [bands], unspecified as to partial versus complete obstruction: Secondary | ICD-10-CM | POA: Diagnosis not present

## 2023-09-18 LAB — BASIC METABOLIC PANEL
Anion gap: 6 (ref 5–15)
BUN: 14 mg/dL (ref 8–23)
CO2: 29 mmol/L (ref 22–32)
Calcium: 8.4 mg/dL — ABNORMAL LOW (ref 8.9–10.3)
Chloride: 100 mmol/L (ref 98–111)
Creatinine, Ser: 0.83 mg/dL (ref 0.44–1.00)
GFR, Estimated: >60 mL/min (ref >60)
Glucose, Bld: 88 mg/dL (ref 70–99)
Potassium: 3.7 mmol/L (ref 3.5–5.1)
Sodium: 135 mmol/L (ref 135–145)

## 2023-09-18 LAB — MAGNESIUM: Magnesium: 2.2 mg/dL (ref 1.7–2.4)

## 2023-09-18 MED ORDER — ORAL CARE MOUTH RINSE: 15.0000 mL | OROMUCOSAL | Status: DC | PRN

## 2023-09-18 NOTE — Evaluation (Signed)
Physical Therapy Evaluation Patient Details Name: Jacqueline Yoder MRN: [id number] DOB: 07-Aug-1946 Today's Date: 09/18/2023  History of Present Illness  Pt is a 77 y.o. female presenting to hospital 09/16/23 with c/o abdominal pain.  Pt admitted with SBO, hypokalemia.  PMH includes h/o rectal and colon CA (stage III), PE on Eliquis, htn, s/p partial colectomy and colostomy and posterior vaginectomy, palliative radiation therapy, lung metastasis, on palliative FOLFIRI chemotherapy.  Clinical Impression  Prior to recent medical concerns, pt was independent with ambulation (occasional use of SPC); lives with her daughter in 1 level home with 4 STE B railing.  No c/o pain during session.  Currently pt is min assist semi-supine to sitting EOB; min assist with transfer; and min assist to ambulate a few feet bed to recliner with hand hold assist.  Pt declining to use RW during session's activities (pt's daughter reports pt is very independent) but anticipate pt would benefit from use of RW.  Pt would currently benefit from skilled PT to address noted impairments and functional limitations (see below for any additional details).  Upon hospital discharge, pt would benefit from ongoing therapy.     If plan is discharge home, recommend the following: A little help with walking and/or transfers;A little help with bathing/dressing/bathroom;Assistance with cooking/housework;Assist for transportation;Help with stairs or ramp for entrance   Can travel by private vehicle        Equipment Recommendations Rolling walker (2 wheels)  Recommendations for Other Services       Functional Status Assessment Patient has had a recent decline in their functional status and demonstrates the ability to make significant improvements in function in a reasonable and predictable amount of time.     Precautions / Restrictions Precautions Precautions: Fall Precaution Comments: NPO; gastric tube; R chest port; LLQ  colostomy Restrictions Weight Bearing Restrictions: No      Mobility  Bed Mobility Overal bed mobility: Needs Assistance Bed Mobility: Supine to Sit     Supine to sit: Min assist, HOB elevated, +2 for safety/equipment     General bed mobility comments: assist for trunk; 2nd assist for lines/drains    Transfers Overall transfer level: Needs assistance Equipment used: 1 person hand held assist Transfers: Sit to/from Stand Sit to Stand: Min assist           General transfer comment: assist to stand from bed; 2nd assist for lines/drains    Ambulation/Gait Ambulation/Gait assistance: Min assist Gait Distance (Feet): 3 Feet (bed to recliner) Assistive device: 1 person hand held assist   Gait velocity: decreased     General Gait Details: assist to steady; 2nd assist for lines/drains  Stairs            Wheelchair Mobility     Tilt Bed    Modified Rankin (Stroke Patients Only)       Balance Overall balance assessment: Needs assistance Sitting-balance support: No upper extremity supported, Feet supported Sitting balance-Leahy Scale: Good Sitting balance - Comments: steady reaching within BOS   Standing balance support: Single extremity supported Standing balance-Leahy Scale: Fair Standing balance comment: steady static standing with single UE support                             Pertinent Vitals/Pain Pain Assessment Pain Assessment: No/denies pain Vitals (HR and SpO2 on room air) stable and WFL throughout treatment session.    Home Living Family/patient expects to be discharged to:: Private residence Living Arrangements:  Children (Pt's daughter works from home) Available Help at Discharge: Family;Available PRN/intermittently Type of Home: House Home Access: Stairs to enter Entrance Stairs-Rails: Right;Left;Can reach both Entrance Stairs-Number of Steps: 4   Home Layout: One level Home Equipment: Cane - single point;Tub bench;Grab  bars - toilet Additional Comments: Stays with her daughter    Prior Function Prior Level of Function : Independent/Modified Independent             Mobility Comments: Occasional use of [facility]       Extremity/Trunk Assessment   Upper Extremity Assessment Upper Extremity Assessment: Overall WFL for tasks assessed    Lower Extremity Assessment Lower Extremity Assessment: Generalized weakness       Communication   Communication Communication: No apparent difficulties Cueing Techniques: Verbal cues  Cognition Arousal: Alert Behavior During Therapy: WFL for tasks assessed/performed Overall Cognitive Status: Within Functional Limits for tasks assessed                                          General Comments  Nursing cleared pt for participation in physical therapy.  Pt agreeable to PT session.  Pt's daughter present during session.    Exercises     Assessment/Plan    PT Assessment Patient needs continued PT services  PT Problem List Decreased strength;Decreased activity tolerance;Decreased balance;Decreased mobility       PT Treatment Interventions DME instruction;Gait training;Stair training;Functional mobility training;Therapeutic activities;Therapeutic exercise;Balance training;Patient/family education    PT Goals (Current goals can be found in the Care Plan section)  Acute Rehab PT Goals Patient Stated Goal: to feel better PT Goal Formulation: With patient/family Time For Goal Achievement: 10/02/23 Potential to Achieve Goals: Good    Frequency Min 1X/week     Co-evaluation               AM-PAC PT "6 Clicks" Mobility  Outcome Measure Help needed turning from your back to your side while in a flat bed without using bedrails?: None Help needed moving from lying on your back to sitting on the side of a flat bed without using bedrails?: A Little Help needed moving to and from a bed to a chair (including a wheelchair)?: A Little Help  needed standing up from a chair using your arms (e.g., wheelchair or bedside chair)?: A Little Help needed to walk in hospital room?: A Little Help needed climbing 3-5 steps with a railing? : A Lot 6 Click Score: 18    End of Session   Activity Tolerance: Patient tolerated treatment well Patient left: in chair;with call bell/phone within reach;with chair alarm set;with nursing/sitter in room;with family/visitor present;Other (comment) (B heels floating via pillow support) Nurse Communication: Mobility status;Precautions PT Visit Diagnosis: Unsteadiness on feet (R26.81);Muscle weakness (generalized) (M62.81);Other abnormalities of gait and mobility (R26.89)    Time: 5284-1324 PT Time Calculation (min) (ACUTE ONLY): 31 min   Charges:   PT Evaluation $PT Eval Low Complexity: 1 Low PT Treatments $Therapeutic Activity: 8-22 mins PT General Charges $$ ACUTE PT VISIT: 1 Visit        Hendricks Limes, PT 09/18/23, 3:07 PM

## 2023-09-18 NOTE — Plan of Care (Signed)

## 2023-09-18 NOTE — Progress Notes (Addendum)
  Progress Note   Patient: Jacqueline Yoder HQI:[id number] DOB: 04-Sep-1946 DOA: 09/16/2023     2 DOS: the patient was seen and examined on 09/18/2023   Brief hospital course: "DARNESHIA LEVITZ is a 77 y.o. female with medical history significant of HTN, HLD, rectal cancer stage IIIc status post partial colectomy and colostomy and posterior vaginectomy, palliative radiation therapy, recurrence in 2022 with lung metastasis on palliative FOLFIRI chemotherapy, DVT/PE on prophylactic Eliquis, presented with small bowel obstruction. " See H&P for full HPI on admission & ED course.  Pt was admitted to hospitalist service with general surgery consulted.  NG tube was placed to wall suction and patient kept NPO.    Further hospital course and management as outlined below.   Assessment and Plan:   SBO General surgery following NG tube placed 12/1, to wall suction NPO Maintenance IV fluids - LR @ 75 cc/hr Up in chair / ambulate as tolerated -- PT evaluation Monitor volume status & electrolytes Pain control & antiemetics PRN    Hypokalemia POA, resolved with replacement Monitor BMP, Mg level  Hyponatremia - mild, Na 132 on admission. Resolved Suspect hypovolemic. Monitor BMP   HTN Holding home hydrochlorothiazide PRN hydralazine while NPO   HLD Holding statin while NPO   History of PE and DVT Holding Eliquis while NPO Lovenox for VTE prophylaxis   Stage IV recurrent rectal cancer -Outpatient follow-up with oncology for continue with palliative chemotherapy       Subjective: Pt seen with daughter at bedside today. Pt has had some return of bowel function. She states abdominal pain resolved.  Denies other complaints.    Physical Exam: Vitals:   09/17/23 1202 09/17/23 2056 09/18/23 0520 09/18/23 0844  BP: (!) 119/57 (!) 114/51 119/65 (!) 116/53  Pulse: 76 81 77 75  Resp: 18 17  16   Temp: 98.2 F (36.8 C) 99 F (37.2 C) 98.6 F (37 C) 98.1 F (36.7 C)  TempSrc: Oral      SpO2: 99% 98% 99% 99%   General exam: awake, alert, no acute distress HEENT: NG tube in place to wall suction, moist mucus membranes, hearing grossly normal  Respiratory system: CTAB, no wheezes, rales or rhonchi, normal respiratory effort. Cardiovascular system: normal S1/S2, RRR, no pedal edema.   Gastrointestinal system: abdomen is soft, colostomy bag with soft/loose stool in the bag, non-tender Central nervous system: A&O x 3. no gross focal neurologic deficits, normal speech Extremities: moves all, no edema, normal tone Psychiatry: normal mood, congruent affect, judgement and insight appear normal   Data Reviewed:  Notable labs ---    Ca 8.4, otherwise normal BMP  Last CBC 12/2 -- WBC 3.1, Hbg 11.9 stable  Family Communication: daughter at bedside on rounds  Disposition: Status is: Inpatient Remains inpatient appropriate because: persistent SBO   Planned Discharge Destination: Home    Time spent: 36 minutes  Author: Pennie Banter, DO 09/18/2023 2:15 PM  For on call review [email or web address].

## 2023-09-18 NOTE — Progress Notes (Signed)
[facility] SURGICAL PROGRESS NOTE (cpt [phone])  Hospital Day(s): 2.   Interval History: Patient seen and examined, no acute events or new complaints overnight. Patient reports she is feeling better this morning. She denied any abdominal pain. No fever, chills, nausea, emesis. Labs this morning are reassuring. KUB continues to have similar appearance in bowel dilation. NGT output recorded at 550 ccs. She is having ostomy output this morning with stool in bag.   Review of Systems:  Constitutional: denies fever, chills  HEENT: denies cough or congestion  Respiratory: denies any shortness of breath  Cardiovascular: denies chest pain or palpitations  Gastrointestinal: denies abdominal pain, N/V Genitourinary: denies burning with urination or urinary frequency Musculoskeletal: denies pain, decreased motor or sensation  Vital signs in last 24 hours: [min-max] current  Temp:  [98.1 F (36.7 C)-100.4 F (38 C)] 98.1 F (36.7 C) (12/03 0844) Pulse Rate:  [75-81] 75 (12/03 0844) Resp:  [16-18] 16 (12/03 0844) BP: (114-135)/(51-65) 116/53 (12/03 0844) SpO2:  [98 %-99 %] 99 % (12/03 0844)             Intake/Output last 2 shifts:  12/02 0701 - 12/03 0700 In: -  Out: 1000 [Urine:450; Emesis/NG output:550]   Physical Exam:  Constitutional: alert, cooperative and no distress  HENT: normocephalic without obvious abnormality; NGT in place  Eyes: PERRL, EOM's grossly intact and symmetric Respiratory: breathing non-labored at rest  Cardiovascular: regular rate and sinus rhythm  Gastrointestinal: soft, non-tender, and non-distended, no rebound/guarding. Colostomy in left mid-abdomen; stool in bag this AM.  Musculoskeletal: no edema or wounds, motor and sensation grossly intact, NT    Labs:     Latest Ref Rng & Units 09/17/2023    5:40 AM 09/16/2023   12:30 PM 09/10/2023    8:27 AM  CBC  WBC 4.0 - 10.5 K/uL 3.1  4.5  2.7   Hemoglobin 12.0 - 15.0 g/dL 59.5  63.8  75.6    Hematocrit 36.0 - 46.0 % 36.2  38.2  33.9   Platelets 150 - 400 K/uL 194  218  170       Latest Ref Rng & Units 09/18/2023    4:09 AM 09/17/2023    5:40 AM 09/16/2023   12:30 PM  CMP  Glucose 70 - 99 mg/dL 88  433  295   BUN 8 - 23 mg/dL 14  11  12    Creatinine 0.44 - 1.00 mg/dL 1.88  4.16  6.06   Sodium 135 - 145 mmol/L 135  133  132   Potassium 3.5 - 5.1 mmol/L 3.7  3.7  3.3   Chloride 98 - 111 mmol/L 100  98  96   CO2 22 - 32 mmol/L 29  27  25    Calcium 8.9 - 10.3 mg/dL 8.4  8.6  9.0   Total Protein 6.5 - 8.1 g/dL   6.9   Total Bilirubin <1.2 mg/dL   0.7   Alkaline Phos 38 - 126 U/L   60   AST 15 - 41 U/L   39   ALT 0 - 44 U/L   22      Imaging studies:   KUB (09/18/2023) personally reviewed with similar bowel gas pattern, and radiologist report reviewed below:  IMPRESSION: 1. Stable and satisfactory enteric tube placement. 2. Persistent paucity of bowel gas similar to the CT scout view. Residual small-bowel obstruction difficult to exclude.   Assessment/Plan: (ICD-10's: K67.609) 77 y.o. female with SBO most likely secondary to post-surgical adhesive  disease, complicated by history of colorectal CA s/p APR on palliative chemotherapy with known bony metastasis    - Fortunately, she feels much better this morning and notes increased output in colostomy. I do think it would be prudent to continue current conservative measures for another 24 hours. If she continues to improve we can work towards clamping trial. If any deterioration or progress stalls, we can do gastrografin challenge.   - Continue NPO for now - Continue NGT decompression; LIS; monitor and record output    - No emergent surgical intervention   - Monitor abdominal examination; ostomy function - Pain control prn; antiemetics prn - Okay to mobilize - further management per primary service; we will follow    All of the above findings and recommendations were discussed with the patient, patient's family  (daughter), and the medical team, and all of patient's and family's questions were answered to their expressed satisfaction.  -- Lynden Oxford, PA-C [facility] 09/18/2023, 9:05 AM M-F: 7am - 4pm

## 2023-09-19 ENCOUNTER — Inpatient Hospital Stay: Payer: 59

## 2023-09-19 DIAGNOSIS — K56609 Unspecified intestinal obstruction, unspecified as to partial versus complete obstruction: Secondary | ICD-10-CM | POA: Diagnosis not present

## 2023-09-19 DIAGNOSIS — K565 Intestinal adhesions [bands], unspecified as to partial versus complete obstruction: Secondary | ICD-10-CM | POA: Diagnosis not present

## 2023-09-19 LAB — BASIC METABOLIC PANEL
Anion gap: 8 (ref 5–15)
BUN: 14 mg/dL (ref 8–23)
CO2: 26 mmol/L (ref 22–32)
Calcium: 8.1 mg/dL — ABNORMAL LOW (ref 8.9–10.3)
Chloride: 102 mmol/L (ref 98–111)
Creatinine, Ser: 0.81 mg/dL (ref 0.44–1.00)
GFR, Estimated: >60 mL/min (ref >60)
Glucose, Bld: 63 mg/dL — ABNORMAL LOW (ref 70–99)
Potassium: 3.1 mmol/L — ABNORMAL LOW (ref 3.5–5.1)
Sodium: 136 mmol/L (ref 135–145)

## 2023-09-19 LAB — GLUCOSE, CAPILLARY
Glucose-Capillary: 154 mg/dL — ABNORMAL HIGH (ref 70–99)
Glucose-Capillary: 51 mg/dL — ABNORMAL LOW (ref 70–99)

## 2023-09-19 MED ORDER — DEXTROSE-SODIUM CHLORIDE 5-0.45 % IV SOLN: INTRAVENOUS | Status: DC

## 2023-09-19 MED ORDER — POTASSIUM CHLORIDE 10 MEQ/100ML IV SOLN
10.0000 meq | INTRAVENOUS | Status: AC
  Administered 2023-09-19: 10 meq via INTRAVENOUS
  Filled 2023-09-19: qty 100

## 2023-09-19 MED ORDER — MAGNESIUM CHLORIDE 64 MG PO TBEC
1.0000 | DELAYED_RELEASE_TABLET | Freq: Two times a day (BID) | ORAL | Status: DC
  Administered 2023-09-19 – 2023-09-20 (×2): 64 mg via ORAL
  Filled 2023-09-19 (×2): qty 1

## 2023-09-19 MED ORDER — ADULT MULTIVITAMIN W/MINERALS CH
1.0000 | ORAL_TABLET | Freq: Every day | ORAL | Status: DC
  Administered 2023-09-19 – 2023-09-20 (×2): 1 via ORAL
  Filled 2023-09-19 (×2): qty 1

## 2023-09-19 MED ORDER — APIXABAN 2.5 MG PO TABS
2.5000 mg | ORAL_TABLET | Freq: Two times a day (BID) | ORAL | Status: DC
  Administered 2023-09-19 – 2023-09-20 (×2): 2.5 mg via ORAL
  Filled 2023-09-19 (×2): qty 1

## 2023-09-19 MED ORDER — ONDANSETRON 4 MG PO TBDP: 4.0000 mg | ORAL_TABLET | Freq: Three times a day (TID) | ORAL | Status: DC | PRN

## 2023-09-19 MED ORDER — PANTOPRAZOLE SODIUM 40 MG PO TBEC
40.0000 mg | DELAYED_RELEASE_TABLET | Freq: Every day | ORAL | Status: DC
  Administered 2023-09-19 – 2023-09-20 (×2): 40 mg via ORAL
  Filled 2023-09-19 (×2): qty 1

## 2023-09-19 MED ORDER — SIMVASTATIN 20 MG PO TABS
40.0000 mg | ORAL_TABLET | Freq: Every day | ORAL | Status: DC
  Administered 2023-09-19: 40 mg via ORAL
  Filled 2023-09-19: qty 2

## 2023-09-19 MED ORDER — DEXTROSE 50 % IV SOLN
INTRAVENOUS | Status: AC
  Administered 2023-09-19: 50 mL
  Filled 2023-09-19: qty 50

## 2023-09-19 MED ORDER — POTASSIUM CHLORIDE CRYS ER 20 MEQ PO TBCR
40.0000 meq | EXTENDED_RELEASE_TABLET | Freq: Once | ORAL | Status: AC
  Administered 2023-09-19: 40 meq via ORAL
  Filled 2023-09-19: qty 2

## 2023-09-19 NOTE — Evaluation (Signed)
Occupational Therapy Evaluation Patient Details Name: Jacqueline Yoder MRN: [id number] DOB: [birth year] Today's Date: 09/19/2023   History of Present Illness Pt is a 77 y.o. female presenting to hospital 09/16/23 with c/o abdominal pain.  Pt admitted with SBO, hypokalemia.  PMH includes h/o rectal and colon CA (stage III), PE on Eliquis, htn, s/p partial colectomy and colostomy and posterior vaginectomy, palliative radiation therapy, lung metastasis, on palliative FOLFIRI chemotherapy.   Clinical Impression   Prior to hospital admission, pt was independent with ADLs/IADLs, occasionally using SPC for mobility. Pt lives with her daughter who works from home, and assists pt with transportation as pt does not drive due to cataracts. Pt currently requires HHA + CGA for functional transfers, bed mobility with supervision, walks ~20ft in room forwards/backwards with HHA / unilateral support on IV pole (would benefit from RW for increased stability). MinA to don bra clasp, setup-CGA for ADL performance on eval. Educated pt on importance of OOB mobility in hallway / up in recliner as able, pt verbalizing understanding. Pt would benefit from skilled OT services to address noted impairments and functional limitations (see below for any additional details) in order to maximize safety and independence while minimizing falls risk and caregiver burden. Anticipate the need for follow up [facility] OT services upon acute hospital DC.        If plan is discharge home, recommend the following: A little help with walking and/or transfers;A little help with bathing/dressing/bathroom;Assistance with cooking/housework;Assist for transportation    Functional Status Assessment  Patient has had a recent decline in their functional status and demonstrates the ability to make significant improvements in function in a reasonable and predictable amount of time.  Equipment Recommendations  None recommended by OT (pt has necessary DME)        Precautions / Restrictions Precautions Precautions: Fall Precaution Comments: NPO; gastric tube; R chest port; LLQ colostomy Restrictions Weight Bearing Restrictions: No      Mobility Bed Mobility Overal bed mobility: Needs Assistance Bed Mobility: Supine to Sit     Supine to sit: Supervision          Transfers Overall transfer level: Needs assistance Equipment used: 1 person hand held assist Transfers: Sit to/from Stand, Bed to chair/wheelchair/BSC Sit to Stand: Contact guard assist     Step pivot transfers: Contact guard assist     General transfer comment: pt spontanously stands from EOB, no physical assist, reaches for OT's arm once standing. Transfers from bed > recliner with CGA      Balance Overall balance assessment: Needs assistance Sitting-balance support: No upper extremity supported, Feet supported Sitting balance-Leahy Scale: Good     Standing balance support: Single extremity supported Standing balance-Leahy Scale: Fair Standing balance comment: steady static standing with single UE support                           ADL either performed or assessed with clinical judgement   ADL Overall ADL's : Needs assistance/impaired Eating/Feeding: NPO   Grooming: Set up;Sitting   Upper Body Bathing: Sitting;Set up   Lower Body Bathing: Contact guard assist;Sitting/lateral leans;Sit to/from stand   Upper Body Dressing : Minimal assistance;Sitting Upper Body Dressing Details (indicate cue type and reason): assist to clasp bra (mostly due to gown/lines in way) Lower Body Dressing: Contact guard assist;Sit to/from stand;Sitting/lateral leans Lower Body Dressing Details (indicate cue type and reason): dons/doffs socks seated in recliner Toilet Transfer: Contact guard Armed forces operational officer  Details (indicate cue type and reason): transfers with CGA, HHA, mild unsteadiness noted, pt spontanously standing from EOB and reaching for  OT's arm to hold Toileting- Clothing Manipulation and Hygiene: Contact guard assist;Sitting/lateral lean;Sit to/from stand (ostomy bag)       Functional mobility during ADLs: Contact guard assist General ADL Comments: Pt motivated to return to PLOF,  limited by generalized weakness and impaired balance with decreased activity tolerance.     Vision Baseline Vision/History: 4 Cataracts Ability to See in Adequate Light: 0 Adequate Patient Visual Report: No change from baseline (pt reported she stopped driving due to cateracts)              Pertinent Vitals/Pain Pain Assessment Pain Assessment: No/denies pain     Extremity/Trunk Assessment Upper Extremity Assessment Upper Extremity Assessment: Overall WFL for tasks assessed;Generalized weakness   Lower Extremity Assessment Lower Extremity Assessment: Generalized weakness       Communication Communication Communication: No apparent difficulties Cueing Techniques: Verbal cues   Cognition Arousal: Alert Behavior During Therapy: WFL for tasks assessed/performed Overall Cognitive Status: Within Functional Limits for tasks assessed                                       General Comments  PA in room to remove NG tube during session; RN cleared pt for participation in OT session            Home Living Family/patient expects to be discharged to:: Private residence Living Arrangements: Children (daughter who works from home) Available Help at Discharge: Family;Available PRN/intermittently Type of Home: House Home Access: Stairs to enter [employer] of Steps: 4 Entrance Stairs-Rails: Right;Left;Can reach both Home Layout: One level     Bathroom Shower/Tub: Chief Strategy Officer: Handicapped height     Home Equipment: Cane - single point;Tub bench;Grab bars - toilet   Additional Comments: Stays with her daughter      Prior Functioning/Environment Prior Level of Function :  Independent/Modified Independent             Mobility Comments: Occasional use of SPC ADLs Comments: independent with ADLs and IADLs; does not drive        OT Problem List: Decreased strength;Decreased activity tolerance;Impaired balance (sitting and/or standing);Decreased knowledge of precautions      OT Treatment/Interventions: Self-care/ADL training;Therapeutic exercise;Energy conservation;DME and/or AE instruction;Therapeutic activities;Patient/family education;Balance training    OT Goals(Current goals can be found in the care plan section) Acute Rehab OT Goals OT Goal Formulation: With patient Time For Goal Achievement: 10/03/23 Potential to Achieve Goals: Good  OT Frequency: Min 1X/week       AM-PAC OT "6 Clicks" Daily Activity     Outcome Measure Help from another person eating meals?: Total (NPO) Help from another person taking care of personal grooming?: A Little Help from another person toileting, which includes using toliet, bedpan, or urinal?: A Little Help from another person bathing (including washing, rinsing, drying)?: A Little Help from another person to put on and taking off regular upper body clothing?: None Help from another person to put on and taking off regular lower body clothing?: A Little 6 Click Score: 17   End of Session Nurse Communication: Mobility status  Activity Tolerance: Patient tolerated treatment well Patient left: in chair;with call bell/phone within reach;with chair alarm set  OT Visit Diagnosis: Other abnormalities of gait and mobility (R26.89);Muscle weakness (generalized) (M62.81);Unsteadiness  on feet (R26.81)                Time: 1610-9604 OT Time Calculation (min): 27 min Charges:  OT General Charges $OT Visit: 1 Visit OT Evaluation $OT Eval Moderate Complexity: 1 Mod OT Treatments $Self Care/Home Management : 8-22 mins  Taejah Ohalloran L. Koron Godeaux, OTR/L  09/19/23, 9:26 AM

## 2023-09-19 NOTE — Care Management Important Message (Signed)
Important Message  Patient Details  Name: Jacqueline Yoder MRN: [id number] Date of Birth: 03/04/1946   Important Message Given:  Yes - Medicare IM     Verita Schneiders Seirra Kos 09/19/2023, 3:16 PM

## 2023-09-19 NOTE — Consult Note (Signed)
PHARMACY CONSULT NOTE - ELECTROLYTES  Pharmacy Consult for Electrolyte Monitoring and Replacement   Recent Labs: Potassium (mmol/L)  Date Value  09/19/2023 3.1 (L)   Magnesium (mg/dL)  Date Value  16/04/3709 2.2   Calcium (mg/dL)  Date Value  62/69/4854 8.1 (L)   Albumin (g/dL)  Date Value  62/70/3500 3.9   Phosphorus (mg/dL)  Date Value  93/81/8299 2.5   Sodium (mmol/L)  Date Value  09/19/2023 136   Height: 5\' 4"  (162.6 cm) Weight: 75.4 kg (166 lb 3.6 oz) IBW/kg (Calculated) : 54.7 Estimated Creatinine Clearance: 58.8 mL/min (by C-G formula based on SCr of 0.81 mg/dL).  Assessment  Jacqueline Yoder is a 77 y.o. female with history of HTN, HLD, rectal cancer stage IIIc status post partial colectomy and colostomy and posterior vaginectomy, palliative radiation therapy, recurrence in 2022 with lung metastasis on palliative FOLFIRI chemotherapy, DVT/PE on prophylactic Eliquis presenting with SBO. Pharmacy has been consulted to monitor and replace electrolytes.  Diet: NPO, sips w/ meds. NG-T to LIS MIVF: D5 1/2 NS at 50 cc/hr Pertinent medications: N/A  Goal of Therapy: Electrolytes within normal limits  Plan:  K 3.1, Kcl 10 mEq IV q1h x 4. Consider adding potassium to MIVF if NG-T to suction continued. Would consider D5NS + 20 mEq K/L at 75 cc/hr Re-check labs tomorrow  Thank you for allowing pharmacy to be a part of this patient's care.  Tressie Ellis 09/19/2023 8:10 AM

## 2023-09-19 NOTE — Progress Notes (Signed)
Physical Therapy Treatment Patient Details Name: Jacqueline Yoder MRN: [id number] DOB: December 11, 1945 Today's Date: 09/19/2023   History of Present Illness Pt is a 77 y.o. female presenting to hospital 09/16/23 with c/o abdominal pain.  Pt admitted with SBO, hypokalemia.  PMH includes h/o rectal and colon CA (stage III), PE on Eliquis, htn, s/p partial colectomy and colostomy and posterior vaginectomy, palliative radiation therapy, lung metastasis, on palliative FOLFIRI chemotherapy.    PT Comments  Patient received in recliner. She is agreeable to PT session. Patient's daughter in room. She stands with supervision from recliner. Ambulates at quick pace with RW 450 feet with supervision. Patient had leakage from ostomy during ambulation, RN notified. She will continue to benefit from skilled PT to practice steps then likely discharge. Patient moving very well.      If plan is discharge home, recommend the following: Help with stairs or ramp for entrance   Can travel by private vehicle      yes  Equipment Recommendations  Rolling walker (2 wheels)    Recommendations for Other Services       Precautions / Restrictions Precautions Precautions: Fall Precaution Comments: R chest port; LLQ colostomy Restrictions Weight Bearing Restrictions: No     Mobility  Bed Mobility               General bed mobility comments: NT patient received in recliner and left up in recliner    Transfers Overall transfer level: Modified independent Equipment used: Rolling walker (2 wheels) Transfers: Sit to/from Stand     Step pivot transfers: Supervision       General transfer comment: No assist needed    Ambulation/Gait Ambulation/Gait assistance: Supervision Gait Distance (Feet): 450 Feet Assistive device: Rolling walker (2 wheels) Gait Pattern/deviations: Step-through pattern Gait velocity: normal pace     General Gait Details: no A   Stairs             Wheelchair Mobility      Tilt Bed    Modified Rankin (Stroke Patients Only)       Balance Overall balance assessment: Modified Independent Sitting-balance support: Feet supported Sitting balance-Leahy Scale: Normal     Standing balance support: Bilateral upper extremity supported, During functional activity, Reliant on assistive device for balance Standing balance-Leahy Scale: Good                              Cognition Arousal: Alert Behavior During Therapy: WFL for tasks assessed/performed Overall Cognitive Status: Within Functional Limits for tasks assessed                                          Exercises      General Comments General comments (skin integrity, edema, etc.): PA in room to remove NG tube during session; RN cleared pt for participation in OT session      Pertinent Vitals/Pain Pain Assessment Pain Assessment: No/denies pain    Home Living Family/patient expects to be discharged to:: Private residence Living Arrangements: Children (daughter who works from home) Available Help at Discharge: Family;Available PRN/intermittently Type of Home: House Home Access: Stairs to enter Entrance Stairs-Rails: Right;Left;Can reach both Entrance Stairs-Number of Steps: 4   Home Layout: One level Home Equipment: Cane - single point;Tub bench;Grab bars - toilet Additional Comments: Stays with her daughter    Prior Function  PT Goals (current goals can now be found in the care plan section) Acute Rehab PT Goals Patient Stated Goal: to return home PT Goal Formulation: With patient Time For Goal Achievement: 10/02/23 Potential to Achieve Goals: Good Progress towards PT goals: Progressing toward goals    Frequency           PT Plan      Co-evaluation              AM-PAC PT "6 Clicks" Mobility   Outcome Measure  Help needed turning from your back to your side while in a flat bed without using bedrails?: None Help needed  moving from lying on your back to sitting on the side of a flat bed without using bedrails?: A Little Help needed moving to and from a bed to a chair (including a wheelchair)?: None Help needed standing up from a chair using your arms (e.g., wheelchair or bedside chair)?: None Help needed to walk in hospital room?: None Help needed climbing 3-5 steps with a railing? : A Little 6 Click Score: 22    End of Session   Activity Tolerance: Patient tolerated treatment well Patient left: in chair;with family/visitor present Nurse Communication: Other (comment) (patient had colostomy leakage during walk, RN called to room) PT Visit Diagnosis: Muscle weakness (generalized) (M62.81)     Time: 1610-9604 PT Time Calculation (min) (ACUTE ONLY): 11 min  Charges:    $Gait Training: 8-22 mins PT General Charges $$ ACUTE PT VISIT: 1 Visit                     Cayce Paschal, PT, GCS 09/19/23,11:32 AM

## 2023-09-19 NOTE — Progress Notes (Signed)
[facility] SURGICAL PROGRESS NOTE (cpt [phone])  Hospital Day(s): 3.   Interval History: Patient seen and examined, no acute events or new complaints overnight. Patient reports she is feeling much better this AM. She denied any abdominal pain. No fever, chills, nausea, emesis. Labs this morning are reassuring aside from mild hypokalemia to 3.1. KUB appears improved/resolved this AM. NGT output recorded at 400 ccs. She is having ostomy output this morning with stool in bag.   Review of Systems:  Constitutional: denies fever, chills  HEENT: denies cough or congestion  Respiratory: denies any shortness of breath  Cardiovascular: denies chest pain or palpitations  Gastrointestinal: denies abdominal pain, N/V Genitourinary: denies burning with urination or urinary frequency Musculoskeletal: denies pain, decreased motor or sensation  Vital signs in last 24 hours: [min-max] current  Temp:  [98.1 F (36.7 C)-99 F (37.2 C)] 98.9 F (37.2 C) (12/04 0400) Pulse Rate:  [75-83] 82 (12/04 0400) Resp:  [16-17] 17 (12/04 0400) BP: (116-136)/(53-68) 136/68 (12/04 0400) SpO2:  [99 %] 99 % (12/04 0400) Weight:  [75.4 kg] 75.4 kg (12/03 2313)     Height: 5\' 4"  (162.6 cm) Weight: 75.4 kg BMI (Calculated): 28.52   Intake/Output last 2 shifts:  12/03 0701 - 12/04 0700 In: 0  Out: 900 [Urine:500; Emesis/NG output:400]   Physical Exam:  Constitutional: alert, cooperative and no distress  HENT: normocephalic without obvious abnormality; NGT in place (Removed) Eyes: PERRL, EOM's grossly intact and symmetric Respiratory: breathing non-labored at rest  Cardiovascular: regular rate and sinus rhythm  Gastrointestinal: soft, non-tender, and non-distended, no rebound/guarding. Colostomy in left mid-abdomen; stool in bag this AM.  Musculoskeletal: no edema or wounds, motor and sensation grossly intact, NT    Labs:     Latest Ref Rng & Units 09/17/2023    5:40 AM 09/16/2023   12:30 PM  09/10/2023    8:27 AM  CBC  WBC 4.0 - 10.5 K/uL 3.1  4.5  2.7   Hemoglobin 12.0 - 15.0 g/dL 60.4  54.0  98.1   Hematocrit 36.0 - 46.0 % 36.2  38.2  33.9   Platelets 150 - 400 K/uL 194  218  170       Latest Ref Rng & Units 09/19/2023    4:12 AM 09/18/2023    4:09 AM 09/17/2023    5:40 AM  CMP  Glucose 70 - 99 mg/dL 63  88  191   BUN 8 - 23 mg/dL 14  14  11    Creatinine 0.44 - 1.00 mg/dL 4.78  2.95  6.21   Sodium 135 - 145 mmol/L 136  135  133   Potassium 3.5 - 5.1 mmol/L 3.1  3.7  3.7   Chloride 98 - 111 mmol/L 102  100  98   CO2 22 - 32 mmol/L 26  29  27    Calcium 8.9 - 10.3 mg/dL 8.1  8.4  8.6      Imaging studies:   KUB (09/19/2023) personally reviewed with improvement/resolution in obstructive bowel gas pattern, and radiologist report pending:    Assessment/Plan: (ICD-10's: K73.609) 77 y.o. female with clinically and radiographically resolved SBO most likely secondary to post-surgical adhesive disease, complicated by history of colorectal CA s/p APR on palliative chemotherapy with known bony metastasis    - Fortunately, she feels much better this morning and notes increased output in colostomy and KUB appears resolved as well.   - Will start on CLD - NGT removed at bedside   - No emergent  surgical intervention   - Monitor abdominal examination; ostomy function - Pain control prn; antiemetics prn - Okay to mobilize - Further management per primary service; we will follow     - Discharge Planning: Doing well with resolution in obstructive process. We will remove NGT and start on CLD. Slowly advance over the next 24-48 hours prior to DC.   All of the above findings and recommendations were discussed with the patient, and the medical team, and all of patient's questions were answered to their expressed satisfaction.  -- Lynden Oxford, PA-C [facility] 09/19/2023, 7:26 AM M-F: 7am - 4pm

## 2023-09-19 NOTE — Progress Notes (Signed)
No IV present on assessment, Need for IV explained pt refused, requested PO meds, provider made aware and orders changed to PO.

## 2023-09-19 NOTE — Plan of Care (Signed)

## 2023-09-19 NOTE — Progress Notes (Signed)
Progress Note   Patient: Jacqueline Yoder:[id number] DOB: Aug 15, 1946 DOA: 09/16/2023     3 DOS: the patient was seen and examined on 09/19/2023   Brief hospital course: 77yo with h/o HTN, HLD, rectal CA s/p colectomy/colostomy and posterior vaginectomy with lung mets on palliative FOLFIRI chemo and palliative radiation therapy, and VTE on Eliquis who presented on 12/1 with SBO.  NG tube was placed with some improvement.  KUB 12/4 with improvement/resolution.  Assessment and Plan:  SBO General surgery following NG tube placed 12/1, removed 12/4 with what appears to be resolution of SBO Tolerating clear liquids +stool in ostomy Up in chair / ambulate as tolerated -- PT evaluation Continue to advance diet, possibly home in 1-2 days if she has ongoing improvement   Hypokalemia  POA, resolved with replacement and then recurred Electrolyte replacement per pharmacy Resume home MVI, MagCl Monitor BMP, Mg level   HTN Holding home hydrochlorothiazide, consider dc PRN hydralazine while NPO   HLD Resume simvastatin   History of PE and DVT Resume Eliquis   Stage IV recurrent rectal cancer Diagnosed in 09/2019 Recurrence in 01/2021 with lung mets Ongoing progression through 06/2023 so changed to FOLFOX Outpatient follow-up with oncology for ongoing palliative chemotherapy       Consultants: Surgery PT  Procedures: None  Antibiotics: None  30 Day Unplanned Readmission Risk Score    Flowsheet Row ED to Hosp-Admission (Current) from 09/16/2023 in [facility] 1C MEDICAL TELEMETRY  30 Day Unplanned Readmission Risk Score (%) 12.18 Filed at 09/19/2023 0401       This score is the patient's risk of an unplanned readmission within 30 days of being discharged (0 -100%). The score is based on dignosis, age, lab data, medications, orders, and past utilization.   Low:  0-14.9   Medium: 15-21.9   High: 22-29.9   Extreme: 30 and above           Subjective:  Sitting up in chair with daughter at bedside.  Reports feeling much better.  No gas but + ostomy output.   Objective: Vitals:   09/19/23 0400 09/19/23 0823  BP: 136/68 (!) 136/59  Pulse: 82 80  Resp: 17 19  Temp: 98.9 F (37.2 C) 98.8 F (37.1 C)  SpO2: 99% 100%    Intake/Output Summary (Last 24 hours) at 09/19/2023 1531 Last data filed at 09/19/2023 0553 Gross per 24 hour  Intake 0 ml  Output 750 ml  Net -750 ml   Filed Weights   09/18/23 2313  Weight: 75.4 kg    Exam:  General:  Appears calm and comfortable and is in NAD Eyes:   EOMI, normal lids, iris ENT:  grossly normal hearing, lips & tongue, mmm; absent dentition Neck:  no LAD, masses or thyromegaly Cardiovascular:  RRR, no m/r/g. No LE edema.  Respiratory:   CTA bilaterally with no wheezes/rales/rhonchi.  Normal respiratory effort. Abdomen:  soft, NT, ND; ostomy in LLQ with soft brown stool Skin:  no rash or induration seen on limited exam Musculoskeletal:  grossly normal tone BUE/BLE, good ROM, no bony abnormality Psychiatric:  grossly normal mood and affect, speech fluent and appropriate, AOx3 Neurologic:  CN 2-12 grossly intact, moves all extremities in coordinated fashion  Data Reviewed: I have reviewed the patient's lab results since admission.  Pertinent labs for today include:   K+ 3.1 Glucose 63, 51, 154 WBC 3.1 Hgb 11.9    Family Communication: Daughter was present throughout evaluation  Disposition: Status is: Inpatient  Remains inpatient appropriate because: ongoing management     Time spent: 50 minutes  Unresulted Labs (From admission, onward)     Start     Ordered   09/20/23 0500  Basic metabolic panel  Tomorrow morning,   R       Question:  Specimen collection method  Answer:  Lab=Lab collect   09/19/23 0813   09/20/23 0500  Magnesium  Tomorrow morning,   R       Question:  Specimen collection method  Answer:  Lab=Lab collect   09/19/23 0813   09/20/23 0500  Phosphorus   Tomorrow morning,   R       Question:  Specimen collection method  Answer:  Lab=Lab collect   09/19/23 0813   09/20/23 0500  CBC with Differential/Platelet  Tomorrow morning,   R       Question:  Specimen collection method  Answer:  Lab=Lab collect   09/19/23 1523             Author: Jonah Blue, MD 09/19/2023 3:31 PM  For on call review [email or web address].

## 2023-09-19 NOTE — Hospital Course (Addendum)
23GU with h/o HTN, HLD, rectal CA s/p colectomy/colostomy and posterior vaginectomy with lung mets on palliative FOLFIRI chemo and palliative radiation therapy, and VTE on Eliquis who presented on 12/1 with SBO.  NG tube was placed with some improvement.  KUB 12/4 with improvement/resolution.

## 2023-09-20 DIAGNOSIS — K56609 Unspecified intestinal obstruction, unspecified as to partial versus complete obstruction: Secondary | ICD-10-CM | POA: Diagnosis not present

## 2023-09-20 DIAGNOSIS — K565 Intestinal adhesions [bands], unspecified as to partial versus complete obstruction: Secondary | ICD-10-CM | POA: Diagnosis not present

## 2023-09-20 LAB — CBC WITH DIFFERENTIAL/PLATELET
Abs Immature Granulocytes: 0.02 10*3/uL (ref 0.00–0.07)
Basophils Absolute: 0 10*3/uL (ref 0.0–0.1)
Basophils Relative: 0 %
Eosinophils Absolute: 0.1 10*3/uL (ref 0.0–0.5)
Eosinophils Relative: 2 %
HCT: 31 % — ABNORMAL LOW (ref 36.0–46.0)
Hemoglobin: 10.3 g/dL — ABNORMAL LOW (ref 12.0–15.0)
Immature Granulocytes: 1 %
Lymphocytes Relative: 29 %
Lymphs Abs: 1 10*3/uL (ref 0.7–4.0)
MCH: 30.3 pg (ref 26.0–34.0)
MCHC: 33.2 g/dL (ref 30.0–36.0)
MCV: 91.2 fL (ref 80.0–100.0)
Monocytes Absolute: 0.5 10*3/uL (ref 0.1–1.0)
Monocytes Relative: 16 %
Neutro Abs: 1.7 10*3/uL (ref 1.7–7.7)
Neutrophils Relative %: 52 %
Platelets: 176 10*3/uL (ref 150–400)
RBC: 3.4 MIL/uL — ABNORMAL LOW (ref 3.87–5.11)
RDW: 16.2 % — ABNORMAL HIGH (ref 11.5–15.5)
WBC: 3.3 10*3/uL — ABNORMAL LOW (ref 4.0–10.5)
nRBC: 0 % (ref 0.0–0.2)

## 2023-09-20 LAB — BASIC METABOLIC PANEL
Anion gap: 7 (ref 5–15)
BUN: 9 mg/dL (ref 8–23)
CO2: 26 mmol/L (ref 22–32)
Calcium: 8.2 mg/dL — ABNORMAL LOW (ref 8.9–10.3)
Chloride: 106 mmol/L (ref 98–111)
Creatinine, Ser: 0.81 mg/dL (ref 0.44–1.00)
GFR, Estimated: >60 mL/min (ref >60)
Glucose, Bld: 95 mg/dL (ref 70–99)
Potassium: 3.9 mmol/L (ref 3.5–5.1)
Sodium: 139 mmol/L (ref 135–145)

## 2023-09-20 LAB — MAGNESIUM: Magnesium: 1.9 mg/dL (ref 1.7–2.4)

## 2023-09-20 LAB — PHOSPHORUS: Phosphorus: 2.1 mg/dL — ABNORMAL LOW (ref 2.5–4.6)

## 2023-09-20 MED ORDER — FLUTICASONE PROPIONATE 50 MCG/ACT NA SUSP: 1.0000 | Freq: Every day | NASAL | Status: DC | PRN

## 2023-09-20 MED ORDER — K PHOS MONO-SOD PHOS DI & MONO 155-852-130 MG PO TABS
500.0000 mg | ORAL_TABLET | Freq: Once | ORAL | Status: AC
  Administered 2023-09-20: 500 mg via ORAL
  Filled 2023-09-20: qty 2

## 2023-09-20 NOTE — Progress Notes (Signed)
Occupational Therapy Treatment Patient Details Name: Jacqueline Yoder MRN: [id number] DOB: 07-28-1946 Today's Date: 09/20/2023   History of present illness Pt is a 77 y.o. female presenting to hospital 09/16/23 with c/o abdominal pain.  Pt admitted with SBO, hypokalemia.  PMH includes h/o rectal and colon CA (stage III), PE on Eliquis, htn, s/p partial colectomy and colostomy and posterior vaginectomy, palliative radiation therapy, lung metastasis, on palliative FOLFIRI chemotherapy.   OT comments  Pt reports she is feeling stronger today. Daughter present. Session focused on improving strength, mobility, activity tolerance for increased independence for ADLs. Pt completes transfers with CGA progressing to supervision, functional mobility ~450 ft with RW around unit, and stands at sink for self-care supervision level. No LOB throughout, pt walking quickly and motivated. 5x sit<>stand transfers for general strengthening end of session. Discharge recommendation updated. OT will continue to follow for functional gains.       If plan is discharge home, recommend the following:  A little help with walking and/or transfers;A little help with bathing/dressing/bathroom;Assistance with cooking/housework;Assist for transportation   Equipment Recommendations  None recommended by OT       Precautions / Restrictions Precautions Precautions: Fall Precaution Comments: R chest port; LLQ colostomy Restrictions Weight Bearing Restrictions: No       Mobility Bed Mobility               General bed mobility comments: NT patient received in recliner and left up in recliner    Transfers Overall transfer level: Needs assistance Equipment used: Rolling walker (2 wheels) Transfers: Sit to/from Stand Sit to Stand: Supervision           General transfer comment: No assist needed     Balance Overall balance assessment: Needs assistance Sitting-balance support: Feet supported Sitting  balance-Leahy Scale: Normal     Standing balance support: No upper extremity supported Standing balance-Leahy Scale: Good                             ADL either performed or assessed with clinical judgement   ADL Overall ADL's : Needs assistance/impaired     Grooming: Wash/dry hands;Wash/dry face;Standing Grooming Details (indicate cue type and reason): sink level                             Functional mobility during ADLs: Supervision/safety;Contact guard assist;Rolling walker (2 wheels) General ADL Comments: Session focused on functional mobility, STS transfers to improve global strength and balance with standing ADL tasks. Pt completes transfers with CGA progressing to supervision, functional mobility ~450 ft with RW and stands at sink for self-care supervision.    Extremity/Trunk Assessment Upper Extremity Assessment Upper Extremity Assessment: Overall WFL for tasks assessed   Lower Extremity Assessment Lower Extremity Assessment: Overall WFL for tasks assessed         Cognition Arousal: Alert Behavior During Therapy: WFL for tasks assessed/performed Overall Cognitive Status: Within Functional Limits for tasks assessed                                                     Pertinent Vitals/ Pain       Pain Assessment Pain Assessment: No/denies pain   Frequency  Min 1X/week  Progress Toward Goals  OT Goals(current goals can now be found in the care plan section)  Progress towards OT goals: Progressing toward goals  Acute Rehab OT Goals OT Goal Formulation: With patient Time For Goal Achievement: 10/03/23 Potential to Achieve Goals: Good ADL Goals Pt Will Perform Grooming: with modified independence;standing Pt Will Perform Lower Body Bathing: with modified independence;sit to/from stand;sitting/lateral leans Pt Will Perform Lower Body Dressing: with modified independence;sit to/from stand;sitting/lateral  leans Pt Will Transfer to Toilet: with modified independence;ambulating;regular height toilet;bedside commode Pt Will Perform Toileting - Clothing Manipulation and hygiene: with modified independence;sit to/from stand;sitting/lateral leans  Plan         AM-PAC OT "6 Clicks" Daily Activity     Outcome Measure   Help from another person eating meals?: None Help from another person taking care of personal grooming?: None Help from another person toileting, which includes using toliet, bedpan, or urinal?: A Little Help from another person bathing (including washing, rinsing, drying)?: A Little Help from another person to put on and taking off regular upper body clothing?: None Help from another person to put on and taking off regular lower body clothing?: A Little 6 Click Score: 21    End of Session Equipment Utilized During Treatment: Gait belt;Rolling walker (2 wheels)  OT Visit Diagnosis: Other abnormalities of gait and mobility (R26.89);Muscle weakness (generalized) (M62.81);Unsteadiness on feet (R26.81)   Activity Tolerance Patient tolerated treatment well   Patient Left in chair;with call bell/phone within reach;with chair alarm set;with family/visitor present   Nurse Communication Mobility status        Time: 1045-1105 OT Time Calculation (min): 20 min  Charges: OT General Charges $OT Visit: 1 Visit OT Treatments $Self Care/Home Management : 8-22 mins  Zamari Bonsall L. Leniya Breit, OTR/L  09/20/23, 11:15 AM

## 2023-09-20 NOTE — Progress Notes (Signed)
Physical Therapy Treatment Patient Details Name: Jacqueline Yoder MRN: [id number] DOB: [birth year] Today's Date: 09/20/2023   History of Present Illness Pt is a 77 y.o. female presenting to hospital 09/16/23 with c/o abdominal pain.  Pt admitted with SBO, hypokalemia.  PMH includes h/o rectal and colon CA (stage III), PE on Eliquis, htn, s/p partial colectomy and colostomy and posterior vaginectomy, palliative radiation therapy, lung metastasis, on palliative FOLFIRI chemotherapy.    PT Comments  Pt resting in recliner upon PT arrival; pt reports recently working with OT and walking but agreeable to PT and trialing stairs.  During session pt SBA with transfers; SBA ambulating 50 feet x2 (RW 1st trial; SPC 2nd trial); and SBA navigating 8 steps with B railings.  Limited distance walking d/t pt recently walking with OT.  Pt requesting RW for home discharge (feels safer with RW)--TOC notified.  Will continue to focus on strengthening, balance, and progressive functional mobility during hospitalization.   If plan is discharge home, recommend the following: Help with stairs or ramp for entrance   Can travel by private vehicle      Yes  Equipment Recommendations  Rolling walker (2 wheels)    Recommendations for Other Services       Precautions / Restrictions Precautions Precautions: Fall Precaution Comments: R chest port; LLQ colostomy Restrictions Weight Bearing Restrictions: No     Mobility  Bed Mobility               General bed mobility comments: Deferred (pt sitting in recliner beginning/end of session)    Transfers Overall transfer level: Needs assistance Equipment used: Rolling walker (2 wheels) Transfers: Sit to/from Stand Sit to Stand: Supervision           General transfer comment: steady transfer from recliner    Ambulation/Gait Ambulation/Gait assistance: Supervision Gait Distance (Feet):  (50 feet x2) Assistive device: Rolling walker (2 wheels), Straight  cane Gait Pattern/deviations: Step-through pattern, Decreased step length - right, Decreased step length - left Gait velocity: decreased speed with SPC use     General Gait Details: 50 feet with RW; 50 feet with SPC; steady with RW use; mild increased B sway with SPC use but no loss of balance noted   Stairs Stairs: Yes Stairs assistance: Supervision Stair Management: Two rails, Alternating pattern, Step to pattern, Forwards Number of Stairs: 8 General stair comments: 4 steps x2 alternating pattern ascending and step to pattern descending   Wheelchair Mobility     Tilt Bed    Modified Rankin (Stroke Patients Only)       Balance Overall balance assessment: Needs assistance Sitting-balance support: No upper extremity supported, Feet supported Sitting balance-Leahy Scale: Normal Sitting balance - Comments: steady reaching outside BOS   Standing balance support: No upper extremity supported Standing balance-Leahy Scale: Good Standing balance comment: steady reaching within BOS                            Cognition Arousal: Alert Behavior During Therapy: WFL for tasks assessed/performed Overall Cognitive Status: Within Functional Limits for tasks assessed                                          Exercises      General Comments  Pt agreeable to PT session.  Pt's daughter present during session.  Pertinent Vitals/Pain Pain Assessment Pain Assessment: No/denies pain Vitals (HR and SpO2 on room air) stable and WFL throughout treatment session.    Home Living                          Prior Function            PT Goals (current goals can now be found in the care plan section) Acute Rehab PT Goals Patient Stated Goal: to return home PT Goal Formulation: With patient Time For Goal Achievement: 10/02/23 Potential to Achieve Goals: Good Progress towards PT goals: Progressing toward goals    Frequency    Min  1X/week      PT Plan      Co-evaluation              AM-PAC PT "6 Clicks" Mobility   Outcome Measure  Help needed turning from your back to your side while in a flat bed without using bedrails?: None Help needed moving from lying on your back to sitting on the side of a flat bed without using bedrails?: None Help needed moving to and from a bed to a chair (including a wheelchair)?: None Help needed standing up from a chair using your arms (e.g., wheelchair or bedside chair)?: None Help needed to walk in hospital room?: A Little Help needed climbing 3-5 steps with a railing? : A Little 6 Click Score: 22    End of Session   Activity Tolerance: Patient tolerated treatment well Patient left: in chair;with call bell/phone within reach;with chair alarm set;with family/visitor present Nurse Communication: Mobility status;Precautions PT Visit Diagnosis: Muscle weakness (generalized) (M62.81)     Time: 1308-6578 PT Time Calculation (min) (ACUTE ONLY): 13 min  Charges:    $Gait Training: 8-22 mins PT General Charges $$ ACUTE PT VISIT: 1 Visit                     Hendricks Limes, PT 09/20/23, 1:15 PM

## 2023-09-20 NOTE — Consult Note (Signed)
PHARMACY CONSULT NOTE - ELECTROLYTES  Pharmacy Consult for Electrolyte Monitoring and Replacement   Recent Labs: Potassium (mmol/L)  Date Value  09/20/2023 3.9   Magnesium (mg/dL)  Date Value  16/07/9603 1.9   Calcium (mg/dL)  Date Value  54/06/8118 8.2 (L)   Albumin (g/dL)  Date Value  14/78/2956 3.9   Phosphorus (mg/dL)  Date Value  21/30/8657 2.1 (L)   Sodium (mmol/L)  Date Value  09/20/2023 139   Height: 5\' 4"  (162.6 cm) Weight: 75.4 kg (166 lb 3.6 oz) IBW/kg (Calculated) : 54.7 Estimated Creatinine Clearance: 58.8 mL/min (by C-G formula based on SCr of 0.81 mg/dL).  Assessment  Jacqueline Yoder is a 77 y.o. female with history of HTN, HLD, rectal cancer stage IIIc status post partial colectomy and colostomy and posterior vaginectomy, palliative radiation therapy, recurrence in 2022 with lung metastasis on palliative FOLFIRI chemotherapy, DVT/PE on prophylactic Eliquis presenting with SBO. Pharmacy has been consulted to monitor and replace electrolytes.  Diet: Soft MIVF: N/A Pertinent medications: N/A  Goal of Therapy: Electrolytes within normal limits  Plan:  Phos 2.1, K Phos Neutral 500 mg PO x 1 Re-check labs tomorrow. Monitor for re-feeding  Thank you for allowing pharmacy to be a part of this patient's care.  Tressie Ellis 09/20/2023 8:14 AM

## 2023-09-20 NOTE — TOC Transition Note (Signed)
Transition of Care [facility]) - CM/SW Discharge Note   Patient Details  Name: Jacqueline Yoder MRN: [id number] Date of Birth: May 23, 1946  Transition of Care [facility]) CM/SW Contact:  Garret Reddish, RN Phone Number: 09/20/2023, 12:15 PM   Clinical Narrative:    Chart reviewed.  Noted that PT recommended Home Health for PT/OT.  I have spoken with patient's daughter Charlotte Sanes.  She informs me that patient is active with [facility] for wound care.  She would like to continue to use [facility] for PT/OT services. I have informed Marcelino Duster that PT recommended a 2 wheeled rolling walker.  I have asked John with [employer] to provide Rolling walker for patient.   I have informed Morrie Sheldon with [facility] that patient will need to resume RN for wound care and patient will also need PT/OT.   I have informed staff nurse of above information.        Final next level of care: Home w Home Health Services Barriers to Discharge: No Barriers Identified   Patient Goals and CMS Choice CMS Medicare.gov Compare Post Acute Care list provided to:: Patient Choice offered to / list presented to : Patient  Discharge Placement                    Name of family member notified: Charlotte Sanes Patient and family notified of of transfer: 09/20/23  Discharge Plan and Services Additional resources added to the After Visit Summary for                  DME Arranged: Walker rolling   Date DME Agency Contacted: 09/27/23   Representative spoke with at DME Agency: Jonny Ruiz HH Arranged: RN, PT, OT [facility] Agency: [facility] Date HH Agency Contacted: 09/20/23   Representative spoke with at [facility] Agency: Morrie Sheldon  Social Determinants of Health (SDOH) Interventions SDOH Screenings   Food Insecurity: No Food Insecurity (09/17/2023)  Housing: Low Risk  (09/17/2023)  Transportation Needs: No Transportation Needs (09/17/2023)  Utilities: Not At Risk (09/17/2023)  Alcohol Screen: Low Risk  (07/06/2023)  Depression (PHQ2-9):  Low Risk  (07/06/2023)  Financial Resource Strain: Low Risk  (07/09/2023)  Physical Activity: Insufficiently Active (07/09/2023)  Social Connections: Moderately Integrated (07/09/2023)  Recent Concern: Social Connections - Moderately Isolated (07/06/2023)  Stress: No Stress Concern Present (07/09/2023)  Tobacco Use: Medium Risk (09/17/2023)  Health Literacy: Adequate Health Literacy (07/06/2023)     Readmission Risk Interventions     No data to display

## 2023-09-20 NOTE — Progress Notes (Signed)
[facility] SURGICAL PROGRESS NOTE (cpt [id number])  Hospital Day(s): 4.   Interval History: Patient seen and examined, she reports doing well. She tolerated CLD yesterday. Her pain is improved. She has no nausea or vomiting. She reports having to change her stoma twice overnight.   Review of Systems:  Constitutional: denies fever, chills  HEENT: denies cough or congestion  Respiratory: denies any shortness of breath  Cardiovascular: denies chest pain or palpitations  Gastrointestinal: denies abdominal pain, N/V Genitourinary: denies burning with urination or urinary frequency Musculoskeletal: denies pain, decreased motor or sensation  Vital signs in last 24 hours: [min-max] current  Temp:  [98.2 F (36.8 C)-99.5 F (37.5 C)] 98.9 F (37.2 C) (12/05 0409) Pulse Rate:  [65-80] 65 (12/05 0700) Resp:  [18-20] 18 (12/05 0700) BP: (117-136)/(56-64) 117/56 (12/05 0700) SpO2:  [97 %-100 %] 97 % (12/05 0700)     Height: 5\' 4"  (162.6 cm) Weight: 75.4 kg BMI (Calculated): 28.52   Intake/Output last 2 shifts:  No intake/output data recorded.   Physical Exam:  Constitutional: alert, cooperative and no distress  HENT: normocephalic without obvious abnormality; NGT in place (Removed) Eyes: PERRL, EOM's grossly intact and symmetric Respiratory: breathing non-labored at rest  Cardiovascular: regular rate and sinus rhythm  Gastrointestinal: soft, non-tender, and non-distended, no rebound/guarding. Colostomy in left mid-abdomen; stool and air in bag this AM.  Musculoskeletal: no edema or wounds, motor and sensation grossly intact, NT    Labs:     Latest Ref Rng & Units 09/20/2023    4:26 AM 09/17/2023    5:40 AM 09/16/2023   12:30 PM  CBC  WBC 4.0 - 10.5 K/uL 3.3  3.1  4.5   Hemoglobin 12.0 - 15.0 g/dL 41.3  24.4  01.0   Hematocrit 36.0 - 46.0 % 31.0  36.2  38.2   Platelets 150 - 400 K/uL 176  194  218       Latest Ref Rng & Units 09/20/2023    4:26 AM 09/19/2023    4:12 AM  09/18/2023    4:09 AM  CMP  Glucose 70 - 99 mg/dL 95  63  88   BUN 8 - 23 mg/dL 9  14  14    Creatinine 0.44 - 1.00 mg/dL 2.72  5.36  6.44   Sodium 135 - 145 mmol/L 139  136  135   Potassium 3.5 - 5.1 mmol/L 3.9  3.1  3.7   Chloride 98 - 111 mmol/L 106  102  100   CO2 22 - 32 mmol/L 26  26  29    Calcium 8.9 - 10.3 mg/dL 8.2  8.1  8.4      Imaging studies:   KUB (09/19/2023) personally reviewed with improvement/resolution in obstructive bowel gas pattern, and radiologist report pending:    Assessment/Plan: (ICD-10's: K42.609) 77 y.o. female with clinically and radiographically resolved SBO most likely secondary to post-surgical adhesive disease, complicated by history of colorectal CA s/p APR on palliative chemotherapy with known bony metastasis    - Fortunately, she feels much better this morning and notes increased output in colostomy and KUB appears resolved as well.   - Ok to advance to Soft Diet today   - No emergent surgical intervention   - Monitor abdominal examination; ostomy function - Pain control prn; antiemetics prn - Okay to mobilize - Further management per primary service; we will follow     - Discharge Planning: Doing well with resolution in obstructive process. Okay for soft diet and  if tolerates this appropriate for discharge from surgical standpoint. Surgery to sign off for now, please call with any questions or concerns.   All of the above findings and recommendations were discussed with the patient, and the medical team, and all of patient's questions were answered to their expressed satisfaction.  -- Baker Pierini, M.D. [facility]

## 2023-09-20 NOTE — Discharge Summary (Signed)
Physician Discharge Summary   Patient: Jacqueline Yoder MRN: [id number] DOB: [birth year]  Admit date:     09/16/2023  Discharge date: 09/20/23  Discharge Physician: Jonah Blue   PCP: Smitty Cords, DO   Recommendations at discharge:   Bowel obstruction appears to have resolved Stop hydrochlorothiazide Follow up with oncology regarding chemotherapy Follow up with Dr. Althea Charon in 1-2 weeks; will need repeat labs including BMP, Mag++, Phos Home health services have been ordered  Discharge Diagnoses: Principal Problem:   Small bowel obstruction due to adhesions [facility]) Active Problems:   Benign hypertension with CKD (chronic kidney disease), stage II   Hyperlipidemia   Rectal cancer (HCC)   Colostomy in place [facility])   History of pulmonary embolism   Metastatic adenocarcinoma (HCC)   Hypokalemia due to loss of potassium   Hospital Course: 77yo with h/o HTN, HLD, rectal CA s/p colectomy/colostomy and posterior vaginectomy with lung mets on palliative FOLFIRI chemo and palliative radiation therapy, and VTE on Eliquis who presented on 12/1 with SBO.  NG tube was placed with some improvement.  KUB 12/4 with improvement/resolution.  Assessment and Plan:  SBO General surgery following NG tube placed 12/1, removed 12/4 with what appears to be resolution of SBO Tolerating soft diet +stool in ostomy Up in chair / ambulate as tolerated -- PT evaluation says she is ambulating independently very well Will discharge to home   HTN Holding home hydrochlorothiazide throughout hospitalization without HTN, will not resume at this time   HLD Resume simvastatin   History of PE and DVT Resume Eliquis   Stage IV recurrent rectal cancer Diagnosed in 09/2019 Recurrence in 01/2021 with lung mets Ongoing progression through 06/2023 so changed to FOLFOX Outpatient follow-up with oncology for ongoing palliative chemotherapy         Consultants: Surgery PT [facility] team    Procedures: None   Antibiotics: None   30 Day Unplanned Readmission Risk Score    Flowsheet Row ED to Hosp-Admission (Current) from 09/16/2023 in [facility] 1C MEDICAL TELEMETRY  30 Day Unplanned Readmission Risk Score (%) 11.5 Filed at 09/20/2023 0401       This score is the patient's risk of an unplanned readmission within 30 days of being discharged (0 -100%). The score is based on dignosis, age, lab data, medications, orders, and past utilization.   Low:  0-14.9   Medium: 15-21.9   High: 22-29.9   Extreme: 30 and above         Pain control - [place] Controlled Substance Reporting System database was reviewed. and patient was instructed, not to drive, operate heavy machinery, perform activities at heights, swimming or participation in water activities or provide baby-sitting services while on Pain, Sleep and Anxiety Medications; until their outpatient Physician has advised to do so again. Also recommended to not to take more than prescribed Pain, Sleep and Anxiety Medications.    Disposition: Home Diet recommendation:  Regular diet DISCHARGE MEDICATION: Allergies as of 09/20/2023       Reactions   Sulfamethoxazole-trimethoprim Other (See Comments)   Also said she had chills and pressure in nose.        Medication List     STOP taking these medications    triamterene-hydrochlorothiazide 37.5-25 MG capsule Commonly known as: DYAZIDE       TAKE these medications    clindamycin 1 % gel Commonly known as: Clindagel Apply topically 2 (two) times daily.   diclofenac sodium 1 % Gel Commonly known as:  VOLTAREN Apply 2 g topically 4 (four) times daily as needed (joint pain).   diphenoxylate-atropine 2.5-0.025 MG tablet Commonly known as: LOMOTIL Take 1 tablet by mouth 4 (four) times daily as needed for diarrhea or loose stools.   Eliquis 2.5 MG Tabs tablet Generic drug: apixaban TAKE 1 TABLET BY MOUTH TWICE  DAILY   fluticasone  50 MCG/ACT nasal spray Commonly known as: FLONASE Place 1 spray into both nostrils daily as needed for allergies or rhinitis. What changed:  when to take this reasons to take this   gentamicin cream 0.1 % Commonly known as: GARAMYCIN Apply to affected toe once daily.   ipratropium 0.03 % nasal spray Commonly known as: ATROVENT USE 1 SPRAY IN BOTH NOSTRILS  TWICE DAILY What changed: See the new instructions.   ketoconazole 2 % cream Commonly known as: NIZORAL Apply 1 Application topically daily. For up to 2 weeks. May repeat if need.   lidocaine-prilocaine cream Commonly known as: EMLA Apply 1 application. topically as needed.   loratadine 10 MG tablet Commonly known as: CLARITIN Take 10 mg by mouth daily.   magnesium chloride 64 MG Tbec SR tablet Commonly known as: SLOW-MAG Take 1 tablet (64 mg total) by mouth 2 (two) times daily.   nystatin powder Commonly known as: MYCOSTATIN/NYSTOP APPLY 1 POWDER TOPICALLY THREE TIMES DAILY   ONE-A-DAY WOMENS 50 PLUS PO Take 1 tablet by mouth daily.   potassium chloride SA 20 MEQ tablet Commonly known as: KLOR-CON M TAKE 1 TABLET BY MOUTH DAILY   simvastatin 40 MG tablet Commonly known as: ZOCOR Take 1 tablet (40 mg total) by mouth at bedtime.   triamcinolone cream 0.5 % Commonly known as: KENALOG Apply 1 Application topically 2 (two) times daily. To affected areas, for up to 2 weeks.   Vitamin D3 50 MCG (2000 UT) capsule Take 2,000 Units by mouth daily.   zinc gluconate 50 MG tablet Take 50 mg by mouth daily.               Durable Medical Equipment  (From admission, onward)           Start     Ordered   09/20/23 1349  DME Walker  Once       Question Answer Comment  Walker: With 5 Inch Wheels   Patient needs a walker to treat with the following condition Rectal cancer (HCC)      09/20/23 1349            Discharge Exam:   Subjective: Feeling great!  Wants to go home.   Objective: Vitals:    09/20/23 0409 09/20/23 0700  BP: 129/64 (!) 117/56  Pulse: 68 65  Resp: 20 18  Temp: 98.9 F (37.2 C) 98.3 F (36.8 C)  SpO2: 98% 97%   No intake or output data in the 24 hours ending 09/20/23 1349 Filed Weights   09/18/23 2313  Weight: 75.4 kg    Exam:  General:  Appears calm and comfortable and is in NAD Eyes:   EOMI, normal lids, iris ENT:  grossly normal hearing, lips & tongue, mmm; absent dentition Neck:  no LAD, masses or thyromegaly Cardiovascular:  RRR, no m/r/g. No LE edema.  Respiratory:   CTA bilaterally with no wheezes/rales/rhonchi.  Normal respiratory effort. Abdomen:  soft, NT, ND; ostomy in LLQ with soft brown stool Skin:  no rash or induration seen on limited exam Musculoskeletal:  grossly normal tone BUE/BLE, good ROM, no bony abnormality Psychiatric:  grossly  normal mood and affect, speech fluent and appropriate, AOx3 Neurologic:  CN 2-12 grossly intact, moves all extremities in coordinated fashion  Data Reviewed: I have reviewed the patient's lab results since admission.  Pertinent labs for today include:   Phos 2.1 K+ 3.3 Hgb 10.3    Condition at discharge: improving  The results of significant diagnostics from this hospitalization (including imaging, microbiology, ancillary and laboratory) are listed below for reference.   Imaging Studies: DG ABD ACUTE 2+V W 1V CHEST  Result Date: 09/19/2023 CLINICAL DATA:  Small bowel obstruction. EXAM: DG ABDOMEN ACUTE WITH 1 VIEW CHEST COMPARISON:  September 18, 2023. FINDINGS: Right internal jugular Port-A-Cath is noted. Right basilar atelectasis or infiltrate is noted. Probable small pleural effusions are noted. Nasogastric tube tip seen in proximal stomach. No abnormal bowel dilatation is noted. Postsurgical changes are noted in right upper quadrant. IMPRESSION: Right basilar atelectasis or infiltrate with probable small pleural effusions. Nasogastric tube tip seen in proximal stomach. No abnormal bowel  dilatation. Electronically Signed   By: Lupita Raider M.D.   On: 09/19/2023 11:20   DG Abd 1 View  Result Date: 09/18/2023 CLINICAL DATA:  77 year old female with abdominal distension. Small-bowel obstruction. EXAM: ABDOMEN - 1 VIEW COMPARISON:  09/16/2023 radiograph and CT Abdomen and Pelvis. FINDINGS: Enteric tube remains in place, side hole at the level of the gastric body. Paucity of bowel gas similar to the CT scout view 2 days ago. Stable abdominal wall surgical clips or sutures. Stable lung bases. Stable visualized osseous structures. Vascular calcifications. IMPRESSION: 1. Stable and satisfactory enteric tube placement. 2. Persistent paucity of bowel gas similar to the CT scout view. Residual small-bowel obstruction difficult to exclude. Electronically Signed   By: Odessa Fleming M.D.   On: 09/18/2023 08:37   DG Abd 1 View  Result Date: 09/16/2023 CLINICAL DATA:  X-ray for NG tube placement EXAM: ABDOMEN - 1 VIEW limited for tube placement COMPARISON:  CT earlier 09/16/2023 FINDINGS: Limited x-ray of the upper abdomen demonstrates enteric tube with tip overlying the midbody of the stomach. Surgical changes in the midabdomen. Overlapping cardiac leads. IMPRESSION: Limited x-ray for tube placement has a enteric tube with tip overlying the midbody of the stomach. Electronically Signed   By: Karen Kays M.D.   On: 09/16/2023 18:09   CT ABDOMEN PELVIS W CONTRAST  Result Date: 09/16/2023 CLINICAL DATA:  Abdominal pain and nausea EXAM: CT ABDOMEN AND PELVIS WITH CONTRAST TECHNIQUE: Multidetector CT imaging of the abdomen and pelvis was performed using the standard protocol following bolus administration of intravenous contrast. RADIATION DOSE REDUCTION: This exam was performed according to the departmental dose-optimization program which includes automated exposure control, adjustment of the mA and/or kV according to patient size and/or use of iterative reconstruction technique. CONTRAST:  80mL OMNIPAQUE  IOHEXOL 300 MG/ML  SOLN COMPARISON:  06/20/2023 FINDINGS: Lower chest: 1.8 x 1.4 cm right middle lobe nodule (series 4, image 16), not significantly changed in size. Additional scattered subcentimeter bibasilar nodules are also unchanged. Mild bibasilar subsegmental atelectasis. Circumferential thickening of the distal esophagus. Hepatobiliary: Stable 8 mm subcapsular lesion in the anterior inferior right hepatic lobe (series 2, image 37). No new focal liver abnormality. Prior cholecystectomy with similar degree of biliary dilatation. Pancreas: Mild ductal dilatation without focal lesion or inflammatory changes. Spleen: Stable splenic cyst.  No splenomegaly. Adrenals/Urinary Tract: Unremarkable adrenal glands. Multiple bilateral renal cysts which require no follow-up imaging. No renal stone or hydronephrosis. Urinary bladder within normal limits. Stomach/Bowel: Prior colon  resection with left anterior abdominal wall colostomy. Adjacent parastomal hernia. Small volume fluid within the hernia sac. Stomach is distended. Multiple dilated loops of small bowel throughout the abdomen with transition point in the anterior aspect of the lower abdomen, left of midline (series 2, image 54). Normal appendix in the right lower quadrant. Vascular/Lymphatic: Aortoiliac atherosclerosis. Enlarged right external iliac chain lymph node measuring 1.4 cm (series 2, image 68), previously 1.3 cm. Several mildly prominent retroperitoneal lymph nodes have increased in size from prior but remain subcentimeter in size. Reproductive: Status post hysterectomy. No adnexal masses. Other: Small volume ascites.  No pneumoperitoneum. Musculoskeletal: Permeative metastatic lesion the left iliac wing not appreciably changed. No pathologic fracture. No new bony lesion identified. IMPRESSION: 1. Small-bowel obstruction with transition point in the anterior aspect of the lower abdomen, left of midline. This may be secondary to adhesions. 2. Small volume  ascites. 3. Prior colon resection with left anterior abdominal wall colostomy. Adjacent parastomal hernia. Small volume fluid within the hernia sac. 4. Stable pulmonary metastatic disease. 5. Stable metastatic lesion of the left iliac wing. 6. Enlarged right external iliac chain lymph node measuring 1.4 cm, previously 1.3 cm. Several mildly prominent retroperitoneal lymph nodes have increased in size from prior but remain subcentimeter in size. 7. Aortic atherosclerosis (ICD10-I70.0). Electronically Signed   By: Duanne Guess D.O.   On: 09/16/2023 14:07    Microbiology: Results for orders placed or performed during the hospital encounter of 07/13/20  SARS CORONAVIRUS 2 (TAT 6-24 HRS) Nasopharyngeal Nasopharyngeal Swab     Status: None   Collection Time: 07/13/20  9:38 AM   Specimen: Nasopharyngeal Swab  Result Value Ref Range Status   SARS Coronavirus 2 NEGATIVE NEGATIVE Final    Comment: (NOTE) SARS-CoV-2 target nucleic acids are NOT DETECTED.  The SARS-CoV-2 RNA is generally detectable in upper and lower respiratory specimens during the acute phase of infection. Negative results do not preclude SARS-CoV-2 infection, do not rule out co-infections with other pathogens, and should not be used as the sole basis for treatment or other patient management decisions. Negative results must be combined with clinical observations, patient history, and epidemiological information. The expected result is Negative.  Fact Sheet for Patients: HairSlick.no  Fact Sheet for Healthcare Providers: quierodirigir.com  This test is not yet approved or cleared by the [place] FDA and  has been authorized for detection and/or diagnosis of SARS-CoV-2 by FDA under an Emergency Use Authorization (EUA). This EUA will remain  in effect (meaning this test can be used) for the duration of the COVID-19 declaration under Se ction 564(b)(1) of the Act, 21  U.S.C. section 360bbb-3(b)(1), unless the authorization is terminated or revoked sooner.  Performed at [facility], [address], [place], [place] [address]    *Note: Due to a large number of results and/or encounters for the requested time period, some results have not been displayed. A complete set of results can be found in Results Review.    Labs: CBC: Recent Labs  Lab 09/16/23 1230 09/17/23 0540 09/20/23 0426  WBC 4.5 3.1* 3.3*  NEUTROABS  --   --  1.7  HGB 12.3 11.9* 10.3*  HCT 38.2 36.2 31.0*  MCV 91.8 89.8 91.2  PLT 218 194 176   Basic Metabolic Panel: Recent Labs  Lab 09/16/23 1230 09/16/23 1511 09/17/23 0540 09/18/23 0409 09/19/23 0412 09/20/23 0426  NA 132*  --  133* 135 136 139  K 3.3*  --  3.7 3.7 3.1* 3.9  CL 96*  --  98 100 102 106  CO2 25  --  27 29 26 26   GLUCOSE 143*  --  109* 88 63* 95  BUN 12  --  11 14 14 9   CREATININE 0.68  --  0.78 0.83 0.81 0.81  CALCIUM 9.0  --  8.6* 8.4* 8.1* 8.2*  MG 1.7  --   --  2.2  --  1.9  PHOS  --  2.5  --   --   --  2.1*   Liver Function Tests: Recent Labs  Lab 09/16/23 1230  AST 39  ALT 22  ALKPHOS 60  BILITOT 0.7  PROT 6.9  ALBUMIN 3.9   CBG: Recent Labs  Lab 09/19/23 0452 09/19/23 0516  GLUCAP 51* 154*    Discharge time spent: greater than 30 minutes.  Signed: Jonah Blue, MD [facility] 09/20/2023

## 2023-09-21 ENCOUNTER — Encounter: Payer: Self-pay | Admitting: Family Medicine

## 2023-09-21 ENCOUNTER — Telehealth: Payer: Self-pay

## 2023-09-21 ENCOUNTER — Encounter: Payer: Self-pay | Admitting: Oncology

## 2023-09-21 NOTE — Telephone Encounter (Signed)
Relief for Dr. Kirtland Bouchard to review upon his return but go ahead and have her schedule hospital follow-up

## 2023-09-21 NOTE — Patient Outreach (Signed)
Care Management  Transitions of Care Program Transitions of Care Post-discharge Initial   09/21/2023 Name: Jacqueline Yoder MRN: [id number] DOB: Mar 12, 1946  Subjective: Jacqueline Yoder is a 77 y.o. year old female who is a primary care patient of Smitty Cords, DO. The Care Management team Engaged with patient Engaged with patient by telephone to assess and address transitions of care needs.   Consent to Services:  Patient was given information about care management services, agreed to services, and gave verbal consent to participate.   Assessment:  Date of Discharge: 09/20/23 Discharge Facility: [facility] Type of Discharge: Inpatient Admission Primary Inpatient Discharge Diagnosis:: Small bowel obstruction  SDOH Interventions    Flowsheet Row Telephone from 09/21/2023 in [facility] Clinical Support from 07/06/2023 in [facility] Clinical Support from 06/30/2022 in [facility]  SDOH Interventions     Food Insecurity Interventions Intervention Not Indicated Intervention Not Indicated Intervention Not Indicated  Housing Interventions Intervention Not Indicated Intervention Not Indicated Intervention Not Indicated  Transportation Interventions Intervention Not Indicated Intervention Not Indicated, Patient Resources (Friends/Family) Intervention Not Indicated, Patient Resources (Friends/Family)  Utilities Interventions Intervention Not Indicated Intervention Not Indicated Intervention Not Indicated  Alcohol Usage Interventions -- Intervention Not Indicated (Score <7) Intervention Not Indicated (Score <7)  Financial Strain Interventions -- Intervention Not Indicated Intervention Not Indicated  Physical Activity Interventions -- Patient Declined Patient Refused  Stress Interventions -- Intervention Not Indicated Intervention Not Indicated  Social Connections Interventions  -- Intervention Not Indicated Intervention Not Indicated  Health Literacy Interventions -- Intervention Not Indicated --        Goals Addressed             This Visit's Progress    TOC Plan of Care       Current Barriers:  Knowledge Deficits related to plan of care for management of Metastatic cancer  Chronic Disease Management support and education needs related to Metastatic cancer   RNCM Clinical Goal(s):  Patient will work with the Care Management team over the next 30 days to address Transition of Care Barriers: Medication Management Diet/Nutrition/Food Resources Support at home Provider appointments Home Health services Equipment/DME Functional/Safety verbalize basic understanding of  Metastatic cancer disease process and self health management plan as evidenced by no hospital re-admissions in the next 30 days  through collaboration with RN Care manager, provider, and care team.   Interventions: Evaluation of current treatment plan related to  self management and patient's adherence to plan as established by provider   Oncology:  (Status: New goal.) Short Term Goal Assessment of understanding of oncology diagnosis:  Assessed patient understanding of cancer diagnosis and recommended treatment plan Reviewed upcoming provider appointments and treatment appointments Nutrition assessment performed  Patient Goals/Self-Care Activities: Participate in Transition of Care Program/Attend [facility] scheduled calls Notify RN Care Manager of [facility] call rescheduling needs Take all medications as prescribed Attend all scheduled provider appointments Call pharmacy for medication refills 3-7 days in advance of running out of medications Attend church or other social activities  Follow Up Plan:  Telephone follow up appointment with care management team member scheduled for:  Thursday December 12 at 10:00am         [facility] Outreach completed today with the patient and her daughter, Debbe Odea.  The patient has a history of rectal cancer with mets to the lung and the bone. She has completed her XRT but continues with Palliative chemotherapy.  The has a colostomy which she cares for on her own. She denies pain. The patient is independent with ADL's and active with her church network. She lives with her daughter Arlys John. She was hospitalized with a SBO which resolved on its own. She is advancing her diet as tolerated. The patient is resume services with [facility]. They are aware of her hospital discharge but have not scheduled a visit. PCP visit scheduled today.  The patient has been provided with contact information for the care management team and has been advised to call with any health-related questions or concerns. The patient verbalized understanding with current POC. The patient is directed to their insurance card regarding availability of benefits coverage.  Deidre Ala, RN Medical illustrator [facility] [phone]

## 2023-09-21 NOTE — Telephone Encounter (Signed)
Please schedule CT Monday and MD only a few days after CT. Please notify pt of appt details. Mychart message sent with plan

## 2023-09-21 NOTE — Consult Note (Signed)
[facility] Liaison Note  09/21/2023  Jacqueline Yoder 1946-02-14 [id number]  Location: RN Hospital Liaison screened the patient remotely at [facility].  Insurance: [employer]   Jacqueline Yoder is a 77 y.o. female who is a Primary Care Patient of Althea Charon, Netta Neat, DO with [facility]. The patient was screened for  readmission hospitalization with noted low risk score for unplanned readmission risk with 1 IP in 6 months.  The patient was assessed for potential Care Management service needs for post hospital transition for care coordination. Review of patient's electronic medical record reveals patient was admitted for Small bowel obstruction due to adhesions. Pt discharged home with [facility] services resumed with [facility] with supportive family. Pt followed closely by oncology team. No  anticipated needs to address at this time.   Plan: [facility] Liaison will continue to follow progress and disposition to asess for post hospital community care coordination/management needs.  Referral request for community care coordination: anticipate Transitions of Care Team follow up.   [facility] Care Management/Population Health does not replace or interfere with any arrangements made by the Inpatient Transition of Care team.   For questions contact:   Elliot Cousin, RN, [facility] Liaison [place]   [facility], Population Health Office Hours MTWF  8:00 am-6:00 pm Direct Dial: [phone] mobile [phone] [Office toll free line] Office Hours are M-F 8:30 - 5 pm [email or web address]

## 2023-09-24 ENCOUNTER — Ambulatory Visit
Admission: RE | Admit: 2023-09-24 | Discharge: 2023-09-24 | Disposition: A | Discharge status: Home / Self Care | Payer: 59 | Source: Ambulatory Visit | Attending: Oncology | Admitting: Oncology

## 2023-09-24 DIAGNOSIS — C7801 Secondary malignant neoplasm of right lung: Secondary | ICD-10-CM | POA: Diagnosis not present

## 2023-09-24 DIAGNOSIS — C20 Malignant neoplasm of rectum: Secondary | ICD-10-CM | POA: Insufficient documentation

## 2023-09-24 DIAGNOSIS — J90 Pleural effusion, not elsewhere classified: Secondary | ICD-10-CM | POA: Diagnosis not present

## 2023-09-24 DIAGNOSIS — C7802 Secondary malignant neoplasm of left lung: Secondary | ICD-10-CM | POA: Diagnosis not present

## 2023-09-24 MED ORDER — IOHEXOL 300 MG/ML  SOLN
75.0000 mL | Freq: Once | INTRAMUSCULAR | Status: AC | PRN
  Administered 2023-09-24: 75 mL via INTRAVENOUS

## 2023-09-26 ENCOUNTER — Ambulatory Visit: Payer: 59 | Admitting: Family Medicine

## 2023-09-26 ENCOUNTER — Encounter: Payer: Self-pay | Admitting: Family Medicine

## 2023-09-26 VITALS — BP 124/74 | HR 58 | Ht 64.0 in | Wt 166.0 lb

## 2023-09-26 DIAGNOSIS — K565 Intestinal adhesions [bands], unspecified as to partial versus complete obstruction: Secondary | ICD-10-CM

## 2023-09-26 DIAGNOSIS — N182 Chronic kidney disease, stage 2 (mild): Secondary | ICD-10-CM | POA: Diagnosis not present

## 2023-09-26 DIAGNOSIS — I129 Hypertensive chronic kidney disease with stage 1 through stage 4 chronic kidney disease, or unspecified chronic kidney disease: Secondary | ICD-10-CM

## 2023-09-26 DIAGNOSIS — L209 Atopic dermatitis, unspecified: Secondary | ICD-10-CM

## 2023-09-26 NOTE — Patient Instructions (Addendum)
Thank you for coming to the office today.  Remain OFF the BP medication Triamterene-hydrochlorothiazide. BP has been controlled off of the med, we can monitor it going forward. Maybe no longer need it.  -------------  Food blockage -- ?l??stom? patients should also be taught strategies to prevent food blockage proximal to the ??t?my site, which can occur because the ileal lumen is <1 inch (2.5 cm) in diameter. There is also the potential for further narrowing at the point where the bowel passes through the fascia/muscle layer. If the patient consumes large amounts of insoluble fiber, the undigested fiber may create an obstructing mass (bezoar). Common "offenders" include ????orn, coconut, mushrooms, black olives, stringy vegetables, corn, nuts, celery, foods with skins, dried fruits, and meats with casings. Food blockage is easily prevented by instructing the patient to consume potential offenders one at a time in small amounts, to chew thoroughly, and to monitor their response   --------------------------------------  For Right Shoulder arthritis  START anti inflammatory topical - OTC Voltaren (generic Diclofenac) topical 2-4 times a day as needed for pain swelling of affected joint for 1-2 weeks or longer.  Icy-Hot  Heating pad as needed.  Rnage of motion exercises  If not improving let me know.   Please schedule a Follow-up Appointment to: Return if symptoms worsen or fail to improve.  If you have any other questions or concerns, please feel free to call the office or send a message through MyChart. You may also schedule an earlier appointment if necessary.  Additionally, you may be receiving a survey about your experience at our office within a few days to 1 week by e-mail or mail. We value your feedback.  Saralyn Pilar, DO [facility], [place]

## 2023-09-26 NOTE — Progress Notes (Signed)
Subjective:    Patient ID: Jacqueline Yoder, female    DOB: [birth year], 77 y.o.   MRN: [id number]  Jacqueline Yoder is a 77 y.o. female presenting on 09/26/2023 for Hospitalization Follow-up (Medication questions from hospital follow up)   HPI  Discussed the use of AI scribe software for clinical note transcription with the patient, who gave verbal consent to proceed.   HOSPITAL FOLLOW-UP VISIT  Hospital/Location: [facility] Date of Admission: 09/16/23 Date of Discharge: 09/20/23 Transitions of care telephone call: 09/21/23  Reason for Admission: Abdominal Pain, Bowel Obstruction  - Hospital H&P and Discharge Summary have been reviewed - Patient presents today 6 days after recent hospitalization. Brief summary of recent course, patient had symptoms of abdominal pain and bowel obstruction unable to have BM, hospitalized  A CT scan confirmed the bowel obstruction, and a decompression tube was used for treatment. The patient reported resolution of the abdominal pain and return of bowel movements post-hospitalization. A follow-up CT scan by the oncology team showed no bowel restriction.  In hospital, had normal BPs after pain resolved and was not given her BP medication Triamterene-hydrochlorothiazide. It was discontinued and advised not to restart on discharge and repeat BP  - Today reports overall has done well after discharge. Symptoms of abdominal pain has resolved, bowel movements more regular now.  Update with Oncology did a new CT Scan on 09/24/23, negative for bowel obstruction.  Additionally, the patient reported skin irritation and small bumps on the side of the body, likely due to skin friction. She has Nystatin Powder and Triamcinolone cream 0.5%  - New medications on discharge: None - Changes to current meds on discharge: Discontinued Triamterene-hydrochlorothiazide   I have reviewed the discharge medication list, and have reconciled the current and discharge medications  today.   Current Outpatient Medications:    Cholecalciferol (VITAMIN D3) 2000 units capsule, Take 2,000 Units by mouth daily., Disp: , Rfl:    clindamycin (CLINDAGEL) 1 % gel, Apply topically 2 (two) times daily., Disp: 30 g, Rfl: 5   diclofenac sodium (VOLTAREN) 1 % GEL, Apply 2 g topically 4 (four) times daily as needed (joint pain)., Disp: , Rfl: 11   diphenoxylate-atropine (LOMOTIL) 2.5-0.025 MG tablet, Take 1 tablet by mouth 4 (four) times daily as needed for diarrhea or loose stools., Disp: 120 tablet, Rfl: 0   ELIQUIS 2.5 MG TABS tablet, TAKE 1 TABLET BY MOUTH TWICE  DAILY, Disp: 200 tablet, Rfl: 2   fluticasone (FLONASE) 50 MCG/ACT nasal spray, Place 1 spray into both nostrils daily as needed for allergies or rhinitis., Disp: , Rfl:    gentamicin cream (GARAMYCIN) 0.1 %, Apply to affected toe once daily., Disp: 30 g, Rfl: 1   ipratropium (ATROVENT) 0.03 % nasal spray, USE 1 SPRAY IN BOTH NOSTRILS  TWICE DAILY (Patient taking differently: Place 1 spray into both nostrils 2 (two) times daily as needed for rhinitis.), Disp: 60 mL, Rfl: 2   ketoconazole (NIZORAL) 2 % cream, Apply 1 Application topically daily. For up to 2 weeks. May repeat if need., Disp: 30 g, Rfl: 2   lidocaine-prilocaine (EMLA) cream, Apply 1 application. topically as needed., Disp: 30 g, Rfl: 6   loratadine (CLARITIN) 10 MG tablet, Take 10 mg by mouth daily., Disp: , Rfl:    magnesium chloride (SLOW-MAG) 64 MG TBEC SR tablet, Take 1 tablet (64 mg total) by mouth 2 (two) times daily., Disp: 60 tablet, Rfl: 2   Multiple Vitamins-Minerals (ONE-A-DAY WOMENS 50 PLUS PO), Take 1  tablet by mouth daily. , Disp: , Rfl:    nystatin (MYCOSTATIN/NYSTOP) powder, APPLY 1 POWDER TOPICALLY THREE TIMES DAILY, Disp: 45 g, Rfl: 0   potassium chloride SA (KLOR-CON M) 20 MEQ tablet, TAKE 1 TABLET BY MOUTH DAILY, Disp: 100 tablet, Rfl: 2   simvastatin (ZOCOR) 40 MG tablet, Take 1 tablet (40 mg total) by mouth at bedtime., Disp: 100 tablet, Rfl:  3   triamcinolone cream (KENALOG) 0.5 %, Apply 1 Application topically 2 (two) times daily. To affected areas, for up to 2 weeks., Disp: 30 g, Rfl: 2   zinc gluconate 50 MG tablet, Take 50 mg by mouth daily., Disp: , Rfl:  No current facility-administered medications for this visit.  Facility-Administered Medications Ordered in Other Visits:    heparin lock flush 100 UNIT/ML injection, , , ,   ------------------------------------------------------------------------- Social History   Tobacco Use   Smoking status: Former    Current packs/day: 0.00    Types: Cigarettes    Quit date: 12/02/1977    Years since quitting: 45.8   Smokeless tobacco: Former  Building services engineer status: Never Used  Substance Use Topics   Alcohol use: Never   Drug use: Never    Review of Systems Per HPI unless specifically indicated above     Objective:    BP 124/74   Pulse (!) 58   Ht 5\' 4"  (1.626 m)   Wt 166 lb (75.3 kg)   BMI 28.49 kg/m   Wt Readings from Last 3 Encounters:  09/26/23 166 lb (75.3 kg)  09/18/23 166 lb 3.6 oz (75.4 kg)  09/10/23 164 lb 1.6 oz (74.4 kg)    Physical Exam Vitals and nursing note reviewed.  Constitutional:      General: She is not in acute distress.    Appearance: She is well-developed. She is not diaphoretic.     Comments: Well-appearing, comfortable, cooperative  HENT:     Head: Normocephalic and atraumatic.  Eyes:     General:        Right eye: No discharge.        Left eye: No discharge.     Conjunctiva/sclera: Conjunctivae normal.  Neck:     Thyroid: No thyromegaly.     Vascular: No carotid bruit.  Cardiovascular:     Rate and Rhythm: Normal rate and regular rhythm.     Heart sounds: Normal heart sounds. No murmur heard. Pulmonary:     Effort: Pulmonary effort is normal. No respiratory distress.     Breath sounds: No wheezing or rales.  Abdominal:     Comments: Colostomy bag in place  Musculoskeletal:        General: Normal range of motion.      Cervical back: Normal range of motion and neck supple.     Right lower leg: No edema.     Left lower leg: No edema.  Lymphadenopathy:     Cervical: No cervical adenopathy.  Skin:    General: Skin is warm and dry.     Findings: No erythema or rash.  Neurological:     Mental Status: She is alert and oriented to person, place, and time.  Psychiatric:        Behavior: Behavior normal.     Comments: Well groomed, good eye contact, normal speech and thoughts     I have personally reviewed the radiology report from 09/16/23 on CT Abd Pelvis.    CLINICAL DATA:  Abdominal pain and nausea   EXAM: CT  ABDOMEN AND PELVIS WITH CONTRAST   TECHNIQUE: Multidetector CT imaging of the abdomen and pelvis was performed using the standard protocol following bolus administration of intravenous contrast.   RADIATION DOSE REDUCTION: This exam was performed according to the departmental dose-optimization program which includes automated exposure control, adjustment of the mA and/or kV according to patient size and/or use of iterative reconstruction technique.   CONTRAST:  80mL OMNIPAQUE IOHEXOL 300 MG/ML  SOLN   COMPARISON:  06/20/2023   FINDINGS: Lower chest: 1.8 x 1.4 cm right middle lobe nodule (series 4, image 16), not significantly changed in size. Additional scattered subcentimeter bibasilar nodules are also unchanged. Mild bibasilar subsegmental atelectasis. Circumferential thickening of the distal esophagus.   Hepatobiliary: Stable 8 mm subcapsular lesion in the anterior inferior right hepatic lobe (series 2, image 37). No new focal liver abnormality. Prior cholecystectomy with similar degree of biliary dilatation.   Pancreas: Mild ductal dilatation without focal lesion or inflammatory changes.   Spleen: Stable splenic cyst.  No splenomegaly.   Adrenals/Urinary Tract: Unremarkable adrenal glands. Multiple bilateral renal cysts which require no follow-up imaging. No renal stone  or hydronephrosis. Urinary bladder within normal limits.   Stomach/Bowel: Prior colon resection with left anterior abdominal wall colostomy. Adjacent parastomal hernia. Small volume fluid within the hernia sac. Stomach is distended. Multiple dilated loops of small bowel throughout the abdomen with transition point in the anterior aspect of the lower abdomen, left of midline (series 2, image 54). Normal appendix in the right lower quadrant.   Vascular/Lymphatic: Aortoiliac atherosclerosis. Enlarged right external iliac chain lymph node measuring 1.4 cm (series 2, image 68), previously 1.3 cm. Several mildly prominent retroperitoneal lymph nodes have increased in size from prior but remain subcentimeter in size.   Reproductive: Status post hysterectomy. No adnexal masses.   Other: Small volume ascites.  No pneumoperitoneum.   Musculoskeletal: Permeative metastatic lesion the left iliac wing not appreciably changed. No pathologic fracture. No new bony lesion identified.   IMPRESSION: 1. Small-bowel obstruction with transition point in the anterior aspect of the lower abdomen, left of midline. This may be secondary to adhesions. 2. Small volume ascites. 3. Prior colon resection with left anterior abdominal wall colostomy. Adjacent parastomal hernia. Small volume fluid within the hernia sac. 4. Stable pulmonary metastatic disease. 5. Stable metastatic lesion of the left iliac wing. 6. Enlarged right external iliac chain lymph node measuring 1.4 cm, previously 1.3 cm. Several mildly prominent retroperitoneal lymph nodes have increased in size from prior but remain subcentimeter in size. 7. Aortic atherosclerosis (ICD10-I70.0).     Electronically Signed   By: Duanne Guess D.O.   On: 09/16/2023 14:07  Results for orders placed or performed during the hospital encounter of 09/16/23  Lipase, blood  Result Value Ref Range   Lipase 22 11 - 51 U/L  Comprehensive metabolic panel   Result Value Ref Range   Sodium 132 (L) 135 - 145 mmol/L   Potassium 3.3 (L) 3.5 - 5.1 mmol/L   Chloride 96 (L) 98 - 111 mmol/L   CO2 25 22 - 32 mmol/L   Glucose, Bld 143 (H) 70 - 99 mg/dL   BUN 12 8 - 23 mg/dL   Creatinine, Ser 9.60 0.44 - 1.00 mg/dL   Calcium 9.0 8.9 - 45.4 mg/dL   Total Protein 6.9 6.5 - 8.1 g/dL   Albumin 3.9 3.5 - 5.0 g/dL   AST 39 15 - 41 U/L   ALT 22 0 - 44 U/L   Alkaline Phosphatase  60 38 - 126 U/L   Total Bilirubin 0.7 <1.2 mg/dL   GFR, Estimated >91 >47 mL/min   Anion gap 11 5 - 15  CBC  Result Value Ref Range   WBC 4.5 4.0 - 10.5 K/uL   RBC 4.16 3.87 - 5.11 MIL/uL   Hemoglobin 12.3 12.0 - 15.0 g/dL   HCT 82.9 56.2 - 13.0 %   MCV 91.8 80.0 - 100.0 fL   MCH 29.6 26.0 - 34.0 pg   MCHC 32.2 30.0 - 36.0 g/dL   RDW 86.5 (H) 78.4 - 69.6 %   Platelets 218 150 - 400 K/uL   nRBC 0.0 0.0 - 0.2 %  Urinalysis, Routine w reflex microscopic -Urine, Random  Result Value Ref Range   Color, Urine YELLOW (A) YELLOW   APPearance CLEAR (A) CLEAR   Specific Gravity, Urine 1.041 (H) 1.005 - 1.030   pH 8.0 5.0 - 8.0   Glucose, UA NEGATIVE NEGATIVE mg/dL   Hgb urine dipstick NEGATIVE NEGATIVE   Bilirubin Urine NEGATIVE NEGATIVE   Ketones, ur NEGATIVE NEGATIVE mg/dL   Protein, ur 30 (A) NEGATIVE mg/dL   Nitrite NEGATIVE NEGATIVE   Leukocytes,Ua TRACE (A) NEGATIVE   RBC / HPF 0-5 0 - 5 RBC/hpf   WBC, UA 11-20 0 - 5 WBC/hpf   Bacteria, UA NONE SEEN NONE SEEN   Squamous Epithelial / HPF 0 0 - 5 /HPF  Magnesium  Result Value Ref Range   Magnesium 1.7 1.7 - 2.4 mg/dL  Phosphorus  Result Value Ref Range   Phosphorus 2.5 2.5 - 4.6 mg/dL  CBC  Result Value Ref Range   WBC 3.1 (L) 4.0 - 10.5 K/uL   RBC 4.03 3.87 - 5.11 MIL/uL   Hemoglobin 11.9 (L) 12.0 - 15.0 g/dL   HCT 29.5 28.4 - 13.2 %   MCV 89.8 80.0 - 100.0 fL   MCH 29.5 26.0 - 34.0 pg   MCHC 32.9 30.0 - 36.0 g/dL   RDW 44.0 (H) 10.2 - 72.5 %   Platelets 194 150 - 400 K/uL   nRBC 0.0 0.0 - 0.2 %  Basic  metabolic panel  Result Value Ref Range   Sodium 133 (L) 135 - 145 mmol/L   Potassium 3.7 3.5 - 5.1 mmol/L   Chloride 98 98 - 111 mmol/L   CO2 27 22 - 32 mmol/L   Glucose, Bld 109 (H) 70 - 99 mg/dL   BUN 11 8 - 23 mg/dL   Creatinine, Ser 3.66 0.44 - 1.00 mg/dL   Calcium 8.6 (L) 8.9 - 10.3 mg/dL   GFR, Estimated >44 >03 mL/min   Anion gap 8 5 - 15  Basic metabolic panel  Result Value Ref Range   Sodium 135 135 - 145 mmol/L   Potassium 3.7 3.5 - 5.1 mmol/L   Chloride 100 98 - 111 mmol/L   CO2 29 22 - 32 mmol/L   Glucose, Bld 88 70 - 99 mg/dL   BUN 14 8 - 23 mg/dL   Creatinine, Ser 4.74 0.44 - 1.00 mg/dL   Calcium 8.4 (L) 8.9 - 10.3 mg/dL   GFR, Estimated >25 >95 mL/min   Anion gap 6 5 - 15  Magnesium  Result Value Ref Range   Magnesium 2.2 1.7 - 2.4 mg/dL  Basic metabolic panel  Result Value Ref Range   Sodium 136 135 - 145 mmol/L   Potassium 3.1 (L) 3.5 - 5.1 mmol/L   Chloride 102 98 - 111 mmol/L   CO2 26  22 - 32 mmol/L   Glucose, Bld 63 (L) 70 - 99 mg/dL   BUN 14 8 - 23 mg/dL   Creatinine, Ser 3.97 0.44 - 1.00 mg/dL   Calcium 8.1 (L) 8.9 - 10.3 mg/dL   GFR, Estimated >67 >34 mL/min   Anion gap 8 5 - 15  Glucose, capillary  Result Value Ref Range   Glucose-Capillary 51 (L) 70 - 99 mg/dL   Comment 1 Call MD NNP PA CNM   Glucose, capillary  Result Value Ref Range   Glucose-Capillary 154 (H) 70 - 99 mg/dL   Comment 1 Notify RN   Basic metabolic panel  Result Value Ref Range   Sodium 139 135 - 145 mmol/L   Potassium 3.9 3.5 - 5.1 mmol/L   Chloride 106 98 - 111 mmol/L   CO2 26 22 - 32 mmol/L   Glucose, Bld 95 70 - 99 mg/dL   BUN 9 8 - 23 mg/dL   Creatinine, Ser 1.93 0.44 - 1.00 mg/dL   Calcium 8.2 (L) 8.9 - 10.3 mg/dL   GFR, Estimated >79 >02 mL/min   Anion gap 7 5 - 15  Magnesium  Result Value Ref Range   Magnesium 1.9 1.7 - 2.4 mg/dL  Phosphorus  Result Value Ref Range   Phosphorus 2.1 (L) 2.5 - 4.6 mg/dL  CBC with Differential/Platelet  Result Value Ref  Range   WBC 3.3 (L) 4.0 - 10.5 K/uL   RBC 3.40 (L) 3.87 - 5.11 MIL/uL   Hemoglobin 10.3 (L) 12.0 - 15.0 g/dL   HCT 40.9 (L) 73.5 - 32.9 %   MCV 91.2 80.0 - 100.0 fL   MCH 30.3 26.0 - 34.0 pg   MCHC 33.2 30.0 - 36.0 g/dL   RDW 92.4 (H) 26.8 - 34.1 %   Platelets 176 150 - 400 K/uL   nRBC 0.0 0.0 - 0.2 %   Neutrophils Relative % 52 %   Neutro Abs 1.7 1.7 - 7.7 K/uL   Lymphocytes Relative 29 %   Lymphs Abs 1.0 0.7 - 4.0 K/uL   Monocytes Relative 16 %   Monocytes Absolute 0.5 0.1 - 1.0 K/uL   Eosinophils Relative 2 %   Eosinophils Absolute 0.1 0.0 - 0.5 K/uL   Basophils Relative 0 %   Basophils Absolute 0.0 0.0 - 0.1 K/uL   Immature Granulocytes 1 %   Abs Immature Granulocytes 0.02 0.00 - 0.07 K/uL   *Note: Due to a large number of results and/or encounters for the requested time period, some results have not been displayed. A complete set of results can be found in Results Review.      Assessment & Plan:   Problem List Items Addressed This Visit     Benign hypertension with CKD (chronic kidney disease), stage II - Primary   RESOLVED: Small bowel obstruction due to adhesions (HCC)    Resolved after hospitalization w/ NGT decompression, bowel rest.      Other Visit Diagnoses     Atopic dermatitis, unspecified type            Small Bowel Obstruction SBO - resolved  Resolved with decompression.  Recent CT scan on 09/24/2023 negative for bowel obstruction. Bowel movements have resumed and abdominal pain has resolved. -Continue current management and monitor for any changes.  Hypertension Blood pressure controlled off medication during recent hospital stay and at current visit. Triamterene HCTZ has been discontinued. -Remain off Triamterene HCTZ. Monitor blood pressure going forward.  Skin Irritation / Atopic Dermatitis  vs Candidal Intertrigo Noted irritation and small bumps in skin folds. Currently using absorbent pads and keeping area clean and dry. -Continue current  management. Consider use of Nystatin powder or a topical cream as needed for irritation.  R Shoulder Pain Chronic Rotator Cuff tendinopathy / impingement. Recent onset, possibly related to hospital stay or weather changes. No significant findings on recent PET scan. -Continue with range of motion exercises. Apply Voltaren or similar muscle rub as needed for pain. Consider focused imaging or referral to orthopedics if pain persists or worsens. - Avoid oral NSAID  Dietary Management Post-Bowel Obstruction Patient provided with list of foods to avoid to prevent future bowel obstructions. -Adhere to dietary recommendations. Consider referral to nutritionist for further guidance if needed.       No orders of the defined types were placed in this encounter.   Follow up plan: Return if symptoms worsen or fail to improve.   Saralyn Pilar, DO [facility] 09/26/2023, 2:07 PM

## 2023-09-26 NOTE — Assessment & Plan Note (Signed)
Resolved after hospitalization w/ NGT decompression, bowel rest.

## 2023-09-27 ENCOUNTER — Other Ambulatory Visit: Payer: 59

## 2023-09-27 DIAGNOSIS — L89152 Pressure ulcer of sacral region, stage 2: Secondary | ICD-10-CM | POA: Diagnosis not present

## 2023-09-27 DIAGNOSIS — Z87891 Personal history of nicotine dependence: Secondary | ICD-10-CM | POA: Diagnosis not present

## 2023-09-27 DIAGNOSIS — Z7901 Long term (current) use of anticoagulants: Secondary | ICD-10-CM | POA: Diagnosis not present

## 2023-09-27 DIAGNOSIS — Z79899 Other long term (current) drug therapy: Secondary | ICD-10-CM | POA: Diagnosis not present

## 2023-09-27 DIAGNOSIS — Z556 Problems related to health literacy: Secondary | ICD-10-CM | POA: Diagnosis not present

## 2023-09-27 DIAGNOSIS — I10 Essential (primary) hypertension: Secondary | ICD-10-CM | POA: Diagnosis not present

## 2023-09-27 DIAGNOSIS — B372 Candidiasis of skin and nail: Secondary | ICD-10-CM | POA: Diagnosis not present

## 2023-09-27 NOTE — Patient Instructions (Signed)
Visit Information  Thank you for taking time to visit with me today. Please don't hesitate to contact me if I can be of assistance to you before our next scheduled telephone appointment.    Following is a copy of your care plan:   Goals Addressed             This Visit's Progress    COMPLETED: TOC Plan of Care       Current Barriers:  Knowledge Deficits related to plan of care for management of Metastatic cancer  Chronic Disease Management support and education needs related to Metastatic cancer   RNCM Clinical Goal(s):  Patient will work with the Care Management team over the next 30 days to address Transition of Care Barriers: Medication Management Diet/Nutrition/Food Resources Support at home Provider appointments Home Health services Equipment/DME Functional/Safety verbalize basic understanding of  Metastatic cancer disease process and self health management plan as evidenced by no hospital re-admissions in the next 30 days  through collaboration with RN Care manager, provider, and care team.   Interventions: Evaluation of current treatment plan related to  self management and patient's adherence to plan as established by provider   Oncology:  (Status: New goal.) Short Term Goal Assessment of understanding of oncology diagnosis:  Assessed patient understanding of cancer diagnosis and recommended treatment plan Reviewed upcoming provider appointments and treatment appointments Nutrition assessment performed  Patient Goals/Self-Care Activities: Participate in Transition of Care Program/Attend [place] scheduled calls Notify RN Care Manager of TOC call rescheduling needs Take all medications as prescribed Attend all scheduled provider appointments Call pharmacy for medication refills 3-7 days in advance of running out of medications Attend church or other social activities  Follow Up Plan:  Telephone follow up appointment with care management team member scheduled for:  The  patient declines further follow up phone calls          Patient verbalizes understanding of instructions and care plan provided today and agrees to view in MyChart. Active MyChart status and patient understanding of how to access instructions and care plan via MyChart confirmed with patient.     Please call the care guide team at [phone] if you need to cancel or reschedule your appointment.   Please call the Suicide and Crisis Lifeline: 988 call the [place] National Suicide Prevention Lifeline: 6570843327 or TTY: (208)685-0252 TTY [phone]) to talk to a trained counselor if you are experiencing a Mental Health or Behavioral Health Crisis or need someone to talk to.  Deidre Ala, RN Medical illustrator [facility]-Population Health [phone]

## 2023-09-27 NOTE — Patient Outreach (Signed)
Care Management  Transitions of Care Program Transitions of Care Post-discharge week 2   09/27/2023 Name: Jacqueline Yoder MRN: [id number] DOB: Mar 30, 1946  Subjective: Jacqueline Yoder is a 77 y.o. year old female who is a primary care patient of Smitty Cords, DO. The Care Management team Engaged with patient Engaged with patient by telephone to assess and address transitions of care needs.   Consent to Services:  Patient was given information about care management services, agreed to services, and gave verbal consent to participate.   Assessment: TOC Outreach to the patient today. She went to see her PCP provider yesterday. No new medications ordered. The patient is to continue to not take her HTN medications. She was given a list of foods to avoid due to her recent small bowel obstruction and foods that would enhance her gut health. She is independent with ADL's and colostomy bag. She has no further concerns. She will follow up with her Oncologist for palliative chemotherapy. She declines further phone call from [facility].   SDOH Interventions    Flowsheet Row Patient Outreach from 09/27/2023 in [place] [facility] Telephone from 09/21/2023 in [place] [facility]PART[facility] Clinical Support from 07/06/2023 in [facility] Clinical Support from 06/30/2022 in [facility]  SDOH Interventions      Food Insecurity Interventions Intervention Not Indicated Intervention Not Indicated Intervention Not Indicated Intervention Not Indicated  Housing Interventions Intervention Not Indicated Intervention Not Indicated Intervention Not Indicated Intervention Not Indicated  Transportation Interventions Intervention Not Indicated Intervention Not Indicated Intervention Not Indicated, Patient Resources (Friends/Family) Intervention Not Indicated, Patient Resources (Friends/Family)  Utilities Interventions Intervention Not  Indicated Intervention Not Indicated Intervention Not Indicated Intervention Not Indicated  Alcohol Usage Interventions -- -- Intervention Not Indicated (Score <7) Intervention Not Indicated (Score <7)  Financial Strain Interventions -- -- Intervention Not Indicated Intervention Not Indicated  Physical Activity Interventions -- -- Patient Declined Patient Refused  Stress Interventions -- -- Intervention Not Indicated Intervention Not Indicated  Social Connections Interventions Intervention Not Indicated -- Intervention Not Indicated Intervention Not Indicated  Health Literacy Interventions -- -- Intervention Not Indicated --        Goals Addressed             This Visit's Progress    COMPLETED: TOC Plan of Care       Current Barriers:  Knowledge Deficits related to plan of care for management of Metastatic cancer  Chronic Disease Management support and education needs related to Metastatic cancer   RNCM Clinical Goal(s):  Patient will work with the Care Management team over the next 30 days to address Transition of Care Barriers: Medication Management Diet/Nutrition/Food Resources Support at home Provider appointments Home Health services Equipment/DME Functional/Safety verbalize basic understanding of  Metastatic cancer disease process and self health management plan as evidenced by no hospital re-admissions in the next 30 days  through collaboration with RN Care manager, provider, and care team.   Interventions: Evaluation of current treatment plan related to  self management and patient's adherence to plan as established by provider   Oncology:  (Status: New goal.) Short Term Goal Assessment of understanding of oncology diagnosis:  Assessed patient understanding of cancer diagnosis and recommended treatment plan Reviewed upcoming provider appointments and treatment appointments Nutrition assessment performed  Patient Goals/Self-Care Activities: Participate in Transition  of Care Program/Attend [facility] scheduled calls Notify RN Care Manager of TOC call rescheduling needs Take all medications as  prescribed Attend all scheduled provider appointments Call pharmacy for medication refills 3-7 days in advance of running out of medications Attend church or other social activities  Follow Up Plan:  Telephone follow up appointment with care management team member scheduled for:  The patient declines further follow up phone calls          Please refer to Care Plan for goals and interventions.  Patient educated on red flags s/s to watch for and was encouraged to report any of these identified, any new symptoms, changes in baseline or medication regimen, change in health status / well-being, or safety concerns to PCP and / or the [facility] Case Management team.   The patient has been provided with contact information for the care management team and has been advised to call with any health-related questions or concerns. The patient verbalized understanding with current POC. The patient is directed to their insurance card regarding availability of benefits coverage.  Deidre Ala, RN Medical illustrator [facility] [phone]

## 2023-09-28 ENCOUNTER — Encounter: Payer: Self-pay | Admitting: Oncology

## 2023-09-28 ENCOUNTER — Inpatient Hospital Stay: Payer: 59 | Attending: Oncology | Admitting: Oncology

## 2023-09-28 VITALS — BP 139/63 | HR 89 | Temp 98.3°F | Resp 18 | Wt 167.5 lb

## 2023-09-28 DIAGNOSIS — T451X5A Adverse effect of antineoplastic and immunosuppressive drugs, initial encounter: Secondary | ICD-10-CM | POA: Diagnosis not present

## 2023-09-28 DIAGNOSIS — Z800 Family history of malignant neoplasm of digestive organs: Secondary | ICD-10-CM | POA: Diagnosis not present

## 2023-09-28 DIAGNOSIS — Z86711 Personal history of pulmonary embolism: Secondary | ICD-10-CM

## 2023-09-28 DIAGNOSIS — C19 Malignant neoplasm of rectosigmoid junction: Secondary | ICD-10-CM | POA: Diagnosis not present

## 2023-09-28 DIAGNOSIS — Z79899 Other long term (current) drug therapy: Secondary | ICD-10-CM | POA: Diagnosis not present

## 2023-09-28 DIAGNOSIS — C799 Secondary malignant neoplasm of unspecified site: Secondary | ICD-10-CM | POA: Diagnosis not present

## 2023-09-28 DIAGNOSIS — Z87891 Personal history of nicotine dependence: Secondary | ICD-10-CM | POA: Diagnosis not present

## 2023-09-28 DIAGNOSIS — C20 Malignant neoplasm of rectum: Secondary | ICD-10-CM | POA: Diagnosis not present

## 2023-09-28 DIAGNOSIS — C7801 Secondary malignant neoplasm of right lung: Secondary | ICD-10-CM | POA: Diagnosis not present

## 2023-09-28 DIAGNOSIS — Z7901 Long term (current) use of anticoagulants: Secondary | ICD-10-CM | POA: Insufficient documentation

## 2023-09-28 DIAGNOSIS — Z5111 Encounter for antineoplastic chemotherapy: Secondary | ICD-10-CM | POA: Diagnosis not present

## 2023-09-28 DIAGNOSIS — Z86718 Personal history of other venous thrombosis and embolism: Secondary | ICD-10-CM | POA: Diagnosis not present

## 2023-09-28 DIAGNOSIS — I10 Essential (primary) hypertension: Secondary | ICD-10-CM | POA: Diagnosis not present

## 2023-09-28 DIAGNOSIS — Z556 Problems related to health literacy: Secondary | ICD-10-CM | POA: Diagnosis not present

## 2023-09-28 DIAGNOSIS — B372 Candidiasis of skin and nail: Secondary | ICD-10-CM | POA: Diagnosis not present

## 2023-09-28 DIAGNOSIS — C7951 Secondary malignant neoplasm of bone: Secondary | ICD-10-CM | POA: Diagnosis not present

## 2023-09-28 DIAGNOSIS — D6481 Anemia due to antineoplastic chemotherapy: Secondary | ICD-10-CM

## 2023-09-28 DIAGNOSIS — G629 Polyneuropathy, unspecified: Secondary | ICD-10-CM | POA: Insufficient documentation

## 2023-09-28 DIAGNOSIS — C778 Secondary and unspecified malignant neoplasm of lymph nodes of multiple regions: Secondary | ICD-10-CM | POA: Insufficient documentation

## 2023-09-28 DIAGNOSIS — C7802 Secondary malignant neoplasm of left lung: Secondary | ICD-10-CM | POA: Insufficient documentation

## 2023-09-28 DIAGNOSIS — L89152 Pressure ulcer of sacral region, stage 2: Secondary | ICD-10-CM | POA: Diagnosis not present

## 2023-09-28 NOTE — Assessment & Plan Note (Addendum)
History of stage IIIC Rectal cancer, s/p TNT, followed by 09/17/19 APR/posterior vaginectomy/TAH/BSO/VY-flap, pT4b pN0 with close vaginal margin 0.2 mm.  Uterus and ovaries negative for malignancy. palliative radiation to vaginal recurrence- 01/19/21 recurrence with lung metastasis.-Palliative -FOLFIRI plus bevacizumab.  Irinotecan was dropped in November 2022 due to side effects. Negative for UGT1A1*28 - radiographically stable, rise of CEA-July 2023 CT lung metastasis worse--> Dec 2023 PET showed progression in pelvic lymph nodes and bone lesions,--> 2nd line irinotecan +panitumumab-->CT March 2024 stable. --> June 2024 CT showed mild lung progression/Progressive left iliac bone metastasis, vulvar mass difficult for biopsy in office,  Tempus Liquid biopsy - no actionable variants --> 06/25/2023 CT showed progression of lung nodules [SLD increase 41mm] and iliac inguinal lymphadenopathy--> 07/23/23 switch to dose reduced FOLFOX   Labs are reviewed and discussed with patient, CEA nadir was in 490, currently gradually increasing CT chest abdomen pelvis w contrast showed overall stable disease.  She has grade 1 neuropathy tolerated FOLFOX well.  Recommend continue dose reduced FOLFOX . She will start next treatment on 10/16/2023 - patient's preference.

## 2023-09-28 NOTE — Assessment & Plan Note (Signed)
 Hb stable, observation

## 2023-09-28 NOTE — Assessment & Plan Note (Signed)
 History of PE and bilateral lower extremity DVT  05/18/23 Repeat right LE US showed chronic non occlusive DVT Continue  Eliquis 2.5 mg twice daily for anticoagulation prophylaxis. Recommend leg elevation and compression stocking.

## 2023-09-28 NOTE — Progress Notes (Signed)
Hematology/Oncology Progress note [phone] [phone]      CHIEF COMPLAINTS/REASON FOR VISIT:  Follow up for rectal cancer  ASSESSMENT & PLAN:   Cancer Staging  Rectal cancer [facility]) Staging form: Colon and Rectum, AJCC 8th Edition - Pathologic stage from 10/06/2019: Stage IIC (ypT4b, pN0, cM0) - Signed by Jacqueline Patience, MD on 10/06/2019 - Clinical stage from 06/30/2020: [facility] - Signed by Jacqueline Patience, MD on 04/17/2022 - Pathologic: Stage Unknown (rpTX, pNX, cM1) - Signed by Jacqueline Patience, MD on 01/31/2021   Rectal cancer [facility]) History of stage IIIC Rectal cancer, s/p TNT, followed by 09/17/19 APR/posterior vaginectomy/TAH/BSO/VY-flap, pT4b pN0 with close vaginal margin 0.2 mm.  Uterus and ovaries negative for malignancy. palliative radiation to vaginal recurrence- 01/19/21 recurrence with lung metastasis.-Palliative -FOLFIRI plus bevacizumab.  Irinotecan was dropped in November 2022 due to side effects. Negative for UGT1A1*28 - radiographically stable, rise of CEA-July 2023 CT lung metastasis worse--> Dec 2023 PET showed progression in pelvic lymph nodes and bone lesions,--> 2nd line irinotecan +panitumumab-->CT March 2024 stable. --> June 2024 CT showed mild lung progression/Progressive left iliac bone metastasis, vulvar mass difficult for biopsy in office,  Tempus Liquid biopsy - no actionable variants --> 06/25/2023 CT showed progression of lung nodules [SLD increase 38mm] and iliac inguinal lymphadenopathy--> 07/23/23 switch to dose reduced FOLFOX   Labs are reviewed and discussed with patient, CEA nadir was in 490, currently gradually increasing CT chest abdomen pelvis w contrast showed overall stable disease.  She has grade 1 neuropathy tolerated FOLFOX well.  Recommend continue dose reduced FOLFOX . She will start next treatment on 10/16/2023 - patient's preference.   Anemia due to antineoplastic chemotherapy Hb stable, observation  History of pulmonary  embolism History of PE and bilateral lower extremity DVT  05/18/23 Repeat right LE US showed chronic non occlusive DVT Continue  Eliquis 2.5 mg twice daily for anticoagulation prophylaxis. Recommend leg elevation and compression stocking.    Follow up 2 weeks.  All questions were answered. The patient knows to call the clinic with any problems, questions or concerns.  Jacqueline Patience, MD, PhD [facility] 09/28/2023      HISTORY OF PRESENTING ILLNESS:  Oncology History  Rectal cancer (HCC)  01/23/2019 Initial Diagnosis   Rectal cancer [facility]) Patient initially presented with complaints of postmenopausal bleeding on 08/16/2018.  History of was menopausal vaginal bleeding in 2016 which resulted in cervical polypectomy.  Pathology 02/04/2015 showed cervical polyp, consistent with benign endometrial polyp.  [name] after polypectomy due to anxiety associated with pelvic exams.  pelvic exam on 08/16/2018 reviewed cervical abnormality and from enlarged uterus. Seen by Dr. [name] on 10/29/2018.  Endometrial biopsy and a Pap smear was performed. 10/29/2018 Pap smear showed adenocarcinoma, favor endometrial origin. 10/29/2018 endometrial biopsy showed endometrioid carcinoma, FIGO grade 1.  10/29/2018- TA & TV Ultrasound revealed: Anteverted uterus measuring 8.7 x 5.6 x 6.4 cm without evidence of focal masses.  The endometrium measuring 24.1 mm (thickened) and heterogeneous.  Right and left ovaries not visualized.  No adnexal masses identified.  No free fluid in cul-de-sac.  Patient was seen by Dr. [name] in clinic on 11/13/2018.  Cervical exam reveals 2 cm exophytic irregular mass consistent with malignancy.    11/19/2018 CT chest abdomen pelvis with contrast showed thickened endometrium with some irregularity compatible with the provided diagnosis of endometrial malignancy.  There is a mildly prominent left inguinal node 1.4 cm.  Patient was seen by Dr. [name] on 11/20/2018  and  left groin lymph node biopsy was recommended.  11/26/2018 patient underwent left inguinal lymph node biopsy. Pathology showed metastatic adenocarcinoma consistent with colorectal origin.  CDX 2+.  Case was discussed on tumor board.  Recommend colonoscopy for further evaluation.  Patient reports significant weight loss 30 pounds over the last year.  Chronic vaginal spotting. Change of bowel habits the past few months.  More constipated.  Family history positive for brother who has colon cancer prostate cancer.  patient has underwent colonoscopy on 12/03/2018 which reviewed a nonobstructing large mass in the rectum.  Also chronic fistula.  Mass was not circumferential.  This was biopsied with a cold forceps for histology.  Pathology came back hyperplastic polyp negative for dysplasia and malignancy. [name], patient underwent flex sigmoidoscopy on 12/06/2018 with rebiopsy of the rectal mass. This time biopsy results came back positive for invasive colorectal adenocarcinoma, moderately differentiated. Immunotherapy for nearly mismatch repair protein (MMR ) was performed.  There is no loss of MMR expression.  low probability of MSI high.   # Seen by [name] for evaluation of resectability for rectal cancer. In addition, she also had a second opinion with [facility] where her endometrial biopsy pathology was changed to  adenocarcinoma, consistent with colorectal primary.   [name]. She has home [facility] that has been assisting with ostomy care  Patient was also evaluated by [facility] oncology.  Recommendation is to proceed with TNT with concurrent chemoradiation followed by neoadjuvant chemotherapy followed by surgical resection. Patient prefers to have treatment done locally with [facility].    02/04/2019 - 02/04/2019 Chemotherapy   The patient had capecitabine (XELODA) 150 MG tablet, 150 mg (100 % of original dose 150 mg), Oral, 2 times  daily after meals, 1 of 1 cycle, Start date: 01/27/2019, End date: 04/03/2019 Dose modification: 150 mg (original dose 150 mg, Cycle 1) capecitabine (XELODA) 500 MG tablet, 1,500 mg (100 % of original dose 1,500 mg), Oral, 2 times daily after meals, 1 of 1 cycle, Start date: 01/27/2019, End date: 04/03/2019 Dose modification: 1,500 mg (original dose 1,500 mg, Cycle 1)  for chemotherapy treatment.    04/09/2019 - 07/18/2019 Chemotherapy   The patient had palonosetron (ALOXI) injection 0.25 mg, 0.25 mg, Intravenous,  Once, 8 of 8 cycles Administration: 0.25 mg (04/09/2019), 0.25 mg (04/23/2019), 0.25 mg (05/07/2019), 0.25 mg (05/21/2019), 0.25 mg (06/04/2019), 0.25 mg (06/18/2019), 0.25 mg (07/02/2019), 0.25 mg (07/16/2019) leucovorin 850 mg in dextrose 5 % 250 mL infusion, 844 mg, Intravenous,  Once, 8 of 8 cycles Administration: 850 mg (04/09/2019), 850 mg (04/23/2019), 850 mg (05/07/2019), 850 mg (05/21/2019), 800 mg (06/18/2019), 800 mg (07/02/2019), 800 mg (07/16/2019) oxaliplatin (ELOXATIN) 150 mg in dextrose 5 % 500 mL chemo infusion, 71 mg/m2 = 160 mg (100 % of original dose 75 mg/m2), Intravenous,  Once, 8 of 8 cycles Dose modification: 75 mg/m2 (original dose 75 mg/m2, Cycle 1, Reason: Provider Judgment) Administration: 150 mg (04/09/2019), 150 mg (04/23/2019), 150 mg (05/07/2019), 150 mg (05/21/2019), 150 mg (06/04/2019), 150 mg (06/18/2019), 150 mg (07/02/2019), 150 mg (07/16/2019) fluorouracil (ADRUCIL) 5,000 mg in sodium chloride 0.9 % 150 mL chemo infusion, 2,370 mg/m2 = 5,050 mg, Intravenous, 1 Day/Dose, 8 of 8 cycles Administration: 5,000 mg (04/09/2019), 5,000 mg (04/23/2019), 5,000 mg (05/07/2019), 5,000 mg (05/21/2019), 5,000 mg (06/04/2019), 5,000 mg (06/18/2019), 5,000 mg (07/02/2019), 5,000 mg (07/16/2019)  for chemotherapy treatment.    10/06/2019 Cancer Staging   Staging form: Colon and Rectum,  AJCC 8th Edition - Pathologic stage from 10/06/2019: Stage IIC (ypT4b, pN0, cM0) - Signed by Jacqueline Patience, MD on 10/06/2019    06/30/2020 Cancer Staging   Staging form: Colon and Rectum, AJCC 8th Edition - Clinical stage from 06/30/2020: [facility] - Signed by Jacqueline Patience, MD on 04/17/2022 Stage prefix: Recurrence Total positive nodes: 1   07/16/2020 - 07/19/2020 Chemotherapy         01/31/2021 Cancer Staging   Staging form: Colon and Rectum, AJCC 8th Edition - Pathologic: Stage Unknown (rpTX, pNX, cM1) - Signed by Jacqueline Patience, MD on 01/31/2021 Stage prefix: Recurrence   07/14/2022 Imaging   Bone scan  1. No evidence of skeletal metastatic disease in the hips. 2. Asymmetric increased activity in the RIGHT humeral head is favored degenerative as described above. If pain in the RIGHT shoulder consider further evaluation with plain film or MRI   07/17/2022 Imaging   1. Stable scattered small bilateral pulmonary nodules, largest 8 by 6 mm in the right middle lobe. 2. Subtle chronically stable posterior cortical irregularity and heterogeneity in the left iliac bone, not appreciable on the bone scan of 07/14/2022. This has been present at least over the past year and could reflect radiation therapy related findings although strictly speaking, malignant involvement of the left iliac bone is difficult to exclude. If clinically warranted this could be further assessed with nuclear medicine PET-CT or MRI of the bony pelvis with and without contrast. 3. Pelvic floor laxity with loops of small bowel extending 3 cm below the pubococcygeal line. 4. Aortic and systemic atherosclerosis as detailed above. Aortic Atherosclerosis    12/26/2022 Imaging   CT chest abdomen pelvis w contrast 1. Stable scattered solid pulmonary nodules. 2. Right inguinal and right pelvic lymph nodes which were hypermetabolic on prior PET-CT are stable. 3. Osseous lesion of the proximal right humerus demonstrates increased sclerosis when compared with the prior PET-CT, possibly due to treatment response. 4. Stable osseous irregularity of the left iliac  bone. 5. Aortic Atherosclerosis     06/25/2023 Imaging   CT chest abdomen pelvis w contrast showed 1. Increased size of the solid bilateral pulmonary nodules,consistent with worsening metastatic disease. 2. Increased size of the right external iliac and inguinal lymph nodes, concerning for worsening metastatic disease. 3. No significant interval change in the left iliac bone osseous metastasis. 4. Enlarged main pulmonary artery measuring 3.6 cm, which can be seen in the setting of pulmonary arterial hypertension. 5.  Aortic Atherosclerosis (ICD10-I70.0)     Miscellaneous   Tempus liquid biopsy showed no actionable variants.  NOTCH1 missense variant, BRCA2 missense variant , MYC missense variant   07/11/2023 Imaging   PET showed 1. Multifocal tracer avid abdominopelvic lymph nodes are identified compatible with metastatic disease. 2. Multifocal bilateral pulmonary nodules are identified compatible with pulmonary metastasis.  3. Diffuse increased radiotracer uptake throughout the marrow space is identified diminishing sensitivity for detecting osseous metastasis. No abnormal increased radiotracer uptake identified above background increased bone marrow activity to indicate metabolically active bone metastases. 4.  Aortic Atherosclerosis   09/16/2023 Imaging   CT abdomen pelvis w contrast  1. Small-bowel obstruction with transition point in the anterior aspect of the lower abdomen, left of midline. This may be secondary to adhesions. 2. Small volume ascites. 3. Prior colon resection with left anterior abdominal wall colostomy. Adjacent parastomal hernia. Small volume fluid within the hernia sac. 4. Stable pulmonary metastatic disease. 5. Stable metastatic lesion of the left iliac wing. 6. Enlarged  right external iliac chain lymph node measuring 1.4 cm, previously 1.3 cm. Several mildly prominent retroperitoneal lymph nodes have increased in size from prior but remain subcentimeter  in size. 7. Aortic atherosclerosis (ICD10-I70.0).   09/16/2023 - 09/20/2023 [facility] Admission   Hospitalized due to Small bowel obstruction due to adhesions    09/24/2023 Imaging   1. Bilateral pulmonary metastases are all stable from 06/25/2023 chest CT. 2. No thoracic adenopathy. No new or progressive metastatic disease in the chest. 3. Trace bilateral posterior pleural effusions. 4. Two-vessel coronary atherosclerosis. 5. Dilated main pulmonary artery, suggesting pulmonary arterial hypertension. 6.  Aortic Atherosclerosis (ICD10-I70.0).   Metastatic adenocarcinoma [facility])       # Oncology Treatment:  02/03/2019- 03/19/2019  concurrent Xeloda and radiation.  Xeloda dose 825mg  /m2 BID - rounded to 1650mg  BID- on days of radiation. 04/09/2019, started on FOLFOX with bolus early.  Omitted.  07/16/2019 finished 8 cycles of FOLFOX. 09/17/19 APR/posterior vaginectomy/TAH/BSO/VY-flap pT4b pN0 with close vaginal margin 0.2 mm.  Uterus and ovaries negative for malignancy. Patient reports bilateral lower extremity numbness and tingling, intermittent, left worse than right. She has lost a lot of weight since her APR surgery.   #Family history with half brother having's history of colon cancer prostate cancer.  Personal history of colorectal cancer.  Patient has not decided if she wants genetic testing.    # history of PE( 01/13/2020)  in the bilateral lower extremity DVT (01/13/2020).   She finishes 6 months of anticoagulation with Eliquis 5 mg twice daily. Now switched to Eliquis 2.5 mg twice daily..  # She has now developed recurrent disease. #06/30/20  vaginal introitus mass biopsied. Pathology is consistent with metastatic colorectal adenocarcinoma I have discussed with [name]  Dr. Luciano Cutter and the mass is not resectable. Patient has also had colonoscopy by Dr. Tobi Bastos yesterday. Normal examination. # 07/16/2020 cycle 1 FOLFIRI  # 07/20/2020 PET scan was done for further evaluation, images are  consistent with local recurrence, no distant metastasis. #Discussed with radiation oncology Dr. Rushie Chestnut will recommends concurrent chemotherapy and radiation. 08/02/2020-08/16/2020, patient starts radiation.  Xeloda was held due to neutropenia 08/17/2020,-09/06/2020 Xeloda 1500 mg twice daily concurrently with radiation  01/31/21 started on FOLFIRI + Bev 05/18/2021 CT chest abdomen pelvis showed Previously noted enlargement of bilateral inguinal lymph nodes is resolved, consistent with treatment response of nodal metastatic disease. Interval decrease in size of multiple small bilateral pulmonary nodules, consistent with treatment response of pulmonary metastatic disease. No evidence of new metastatic disease. 05/24/2021 - 08/30/2021, continued on FOLFIRI plus bevacizumab.  Irinotecan dose was reduced, eventually 100mg /m2  09/02/2021, CT chest abdomen pelvis without contrast Showed small bilateral pulmonary nodules, unchanged.  Stable metastatic disease.  No noncontrast evidence of new metastatic disease in the chest abdomen pelvis.  Small parastomal hernia.  Enlargement of main pulmonary artery.  Coronary artery disease.  09/13/2021, maintenance 5-FU/bevacizumab 11/28/2021, 5-FU/Irinotecan/bevacizumab.  Irinotecan 100 mg/m2 was added back due to progressively increasing CEA.  -right lower extremity edema, US showed non occlusive DVT, edema has improved.   INTERVAL HISTORY Jacqueline [facility] is a 77 y.o. female who has above history reviewed by me presents for follow-up of rectal cancer. On Irinotecan and panitumumab. she tolerates well.   She was accompanied by her daughter. Recent hospitalization due to SBO. Today she reports feeling well at baseline. No abdominal pain.  + she reports manageable neuropathy symptoms of her fingertips, intermittent,  worse when exposed to cold temperature.   Review of Systems  Constitutional:  Positive for fatigue. Negative for appetite change, chills, fever and  unexpected weight change.  HENT:   Negative for hearing loss and voice change.   Eyes:  Negative for eye problems.  Respiratory:  Negative for chest tightness and cough.   Cardiovascular:  Negative for chest pain.  Gastrointestinal:  Positive for diarrhea. Negative for abdominal distention, abdominal pain, blood in stool, constipation and nausea.  Endocrine: Negative for hot flashes.  Genitourinary:  Positive for vaginal bleeding. Negative for difficulty urinating and frequency.   Musculoskeletal:  Positive for arthralgias.  Skin:  Negative for itching.  Neurological:  Positive for numbness. Negative for extremity weakness.  Hematological:  Negative for adenopathy.  Psychiatric/Behavioral:  Negative for confusion.     Jacqueline HISTORY:  Past Jacqueline History:  Diagnosis Date   Allergy    Arthritis    Blood clot in vein    Family history of colon cancer    GERD (gastroesophageal reflux disease)    Hypercholesteremia    Hypertension    Hypertension    Lower extremity edema    Personal history of chemotherapy    Rectal cancer (HCC) 12/2018   Urinary incontinence     SURGICAL HISTORY: Past Surgical History:  Procedure Laterality Date   ABDOMINAL HYSTERECTOMY     CHOLECYSTECTOMY  1971   COLONOSCOPY WITH PROPOFOL N/A 12/03/2018   Procedure: COLONOSCOPY WITH PROPOFOL;  Surgeon: Midge Minium, MD;  Location: [facility] ENDOSCOPY;  Service: Endoscopy;  Laterality: N/A;   COLONOSCOPY WITH PROPOFOL N/A 07/15/2020   Procedure: COLONOSCOPY WITH PROPOFOL;  Surgeon: Wyline Mood, MD;  Location: [facility];  Service: Gastroenterology;  Laterality: N/A;   FLEXIBLE SIGMOIDOSCOPY N/A 12/06/2018   Procedure: FLEXIBLE SIGMOIDOSCOPY;  Surgeon: Wyline Mood, MD;  Location: [facility] END[facility];  Service: Endoscopy;  Laterality: N/A;   LAPAROSCOPIC COLOSTOMY  01/06/2019   PORTACATH PLACEMENT N/A 04/03/2019   Procedure: INSERTION PORT-A-CATH;  Surgeon: Leafy Ro, MD;  Location: [facility] ORS;  Service: General;   Laterality: N/A;    SOCIAL HISTORY: Social History   Socioeconomic History   Marital status: Widowed    Spouse name: Not on file   Number of children: Not on file   Years of education: Not on file   Highest education level: Some college, no degree  Occupational History   Not on file  Tobacco Use   Smoking status: Former    Current packs/day: 0.00    Types: Cigarettes    Quit date: 12/02/1977    Years since quitting: 45.8   Smokeless tobacco: Former  Building services engineer status: Never Used  Substance and Sexual Activity   Alcohol use: Never   Drug use: Never   Sexual activity: Not Currently    Birth control/protection: None  Other Topics Concern   Not on file  Social History Narrative   Lives with daughter   Social Drivers of [facility]   Financial Resource Strain: Low Risk  (09/26/2023)   Overall Financial Resource Strain (CARDIA)    Difficulty of Paying Living Expenses: Not very hard  Food Insecurity: No Food Insecurity (09/27/2023)   Hunger Vital Sign    Worried About Running Out of Food in the Last Year: Never true    Ran Out of Food in the Last Year: Never true  Transportation Needs: No Transportation Needs (09/27/2023)   PRAPARE - Administrator, Civil Service (Jacqueline): No    Lack of Transportation (Non-Jacqueline): No  Physical Activity: Insufficiently Active (09/26/2023)   Exercise Vital Sign  Days of Exercise per Week: 2 days    Minutes of Exercise per Session: 10 min  Stress: No Stress Concern Present (09/26/2023)   [employer] of Occupational [facility] - Occupational Stress Questionnaire    Feeling of Stress : Not at all  Social Connections: Moderately Integrated (09/27/2023)   Social Connection and Isolation Panel [NHANES]    Frequency of Communication with Friends and Family: More than three times a week    Frequency of Social Gatherings with Friends and Family: More than three times a week    Attends Religious Services: More than 4 times  per year    Active Member of [employer] or Organizations: Yes    Attends Banker Meetings: More than 4 times per year    Marital Status: Widowed  Recent Concern: Social Connections - Moderately Isolated (07/06/2023)   Social Connection and Isolation Panel [NHANES]    Frequency of Communication with Friends and Family: More than three times a week    Frequency of Social Gatherings with Friends and Family: More than three times a week    Attends Religious Services: More than 4 times per year    Active Member of [employer] or Organizations: No    Attends Banker Meetings: Never    Marital Status: Widowed  Intimate Partner Violence: Not At Risk (09/27/2023)   Humiliation, Afraid, Rape, and Kick questionnaire    Fear of Current or Ex-Partner: No    Emotionally Abused: No    Physically Abused: No    Sexually Abused: No    FAMILY HISTORY: Family History  Problem Relation Age of Onset   Colon cancer Brother 42       exposure to chemicals [place]   Hypertension Mother    Stroke Mother    Kidney failure Father    Breast cancer Neg Hx    Ovarian cancer Neg Hx     ALLERGIES:  is allergic to sulfamethoxazole-trimethoprim.  MEDICATIONS:  Current Outpatient Medications  Medication Sig Dispense Refill   Cholecalciferol (VITAMIN D3) 2000 units capsule Take 2,000 Units by mouth daily.     clindamycin (CLINDAGEL) 1 % gel Apply topically 2 (two) times daily. 30 g 5   diclofenac sodium (VOLTAREN) 1 % GEL Apply 2 g topically 4 (four) times daily as needed (joint pain).  11   diphenoxylate-atropine (LOMOTIL) 2.5-0.025 MG tablet Take 1 tablet by mouth 4 (four) times daily as needed for diarrhea or loose stools. 120 tablet 0   ELIQUIS 2.5 MG TABS tablet TAKE 1 TABLET BY MOUTH TWICE  DAILY 200 tablet 2   fluticasone (FLONASE) 50 MCG/ACT nasal spray Place 1 spray into both nostrils daily as needed for allergies or rhinitis.     gentamicin cream (GARAMYCIN) 0.1 % Apply to affected toe  once daily. 30 g 1   ipratropium (ATROVENT) 0.03 % nasal spray USE 1 SPRAY IN BOTH NOSTRILS  TWICE DAILY (Patient taking differently: Place 1 spray into both nostrils 2 (two) times daily as needed for rhinitis.) 60 mL 2   ketoconazole (NIZORAL) 2 % cream Apply 1 Application topically daily. For up to 2 weeks. May repeat if need. 30 g 2   lidocaine-prilocaine (EMLA) cream Apply 1 application. topically as needed. 30 g 6   loratadine (CLARITIN) 10 MG tablet Take 10 mg by mouth daily.     magnesium chloride (SLOW-MAG) 64 MG TBEC SR tablet Take 1 tablet (64 mg total) by mouth 2 (two) times daily. 60 tablet 2   Multiple  Vitamins-Minerals (ONE-A-DAY WOMENS 50 PLUS PO) Take 1 tablet by mouth daily.      nystatin (MYCOSTATIN/NYSTOP) powder APPLY 1 POWDER TOPICALLY THREE TIMES DAILY 45 g 0   potassium chloride SA (KLOR-CON M) 20 MEQ tablet TAKE 1 TABLET BY MOUTH DAILY 100 tablet 2   simvastatin (ZOCOR) 40 MG tablet Take 1 tablet (40 mg total) by mouth at bedtime. 100 tablet 3   triamcinolone cream (KENALOG) 0.5 % Apply 1 Application topically 2 (two) times daily. To affected areas, for up to 2 weeks. 30 g 2   zinc gluconate 50 MG tablet Take 50 mg by mouth daily.     No current facility-administered medications for this visit.   Facility-Administered Medications Ordered in Other Visits  Medication Dose Route Frequency Provider Last Rate Last Admin   heparin lock flush 100 UNIT/ML injection              PHYSICAL EXAMINATION: ECOG PERFORMANCE STATUS: 1 - Symptomatic but completely ambulatory  Physical Exam Constitutional:      General: She is not in acute distress. HENT:     Head: Normocephalic and atraumatic.  Eyes:     General: No scleral icterus. Cardiovascular:     Rate and Rhythm: Normal rate.     Heart sounds: Normal heart sounds.  Pulmonary:     Effort: Pulmonary effort is normal. No respiratory distress.     Breath sounds: No wheezing.  Abdominal:     General: Bowel sounds are  normal. There is no distension.     Palpations: Abdomen is soft.     Comments: + Colostomy bag   Musculoskeletal:        General: No deformity. Normal range of motion.     Cervical back: Normal range of motion and neck supple.  Skin:    General: Skin is warm and dry.  Neurological:     Mental Status: She is alert and oriented to person, place, and time. Mental status is at baseline.     Cranial Nerves: No cranial nerve deficit.     Coordination: Coordination normal.       LABORATORY DATA:  I have reviewed the data as listed    Latest Ref Rng & Units 09/20/2023    4:26 AM 09/17/2023    5:40 AM 09/16/2023   12:30 PM  CBC  WBC 4.0 - 10.5 K/uL 3.3  3.1  4.5   Hemoglobin 12.0 - 15.0 g/dL 08.6  57.8  46.9   Hematocrit 36.0 - 46.0 % 31.0  36.2  38.2   Platelets 150 - 400 K/uL 176  194  218       Latest Ref Rng & Units 09/20/2023    4:26 AM 09/19/2023    4:12 AM 09/18/2023    4:09 AM  CMP  Glucose 70 - 99 mg/dL 95  63  88   BUN 8 - 23 mg/dL 9  14  14    Creatinine 0.44 - 1.00 mg/dL 6.29  5.28  4.13   Sodium 135 - 145 mmol/L 139  136  135   Potassium 3.5 - 5.1 mmol/L 3.9  3.1  3.7   Chloride 98 - 111 mmol/L 106  102  100   CO2 22 - 32 mmol/L 26  26  29    Calcium 8.9 - 10.3 mg/dL 8.2  8.1  8.4      RADIOGRAPHIC STUDIES: I have personally reviewed the radiological images as listed and agreed with the findings in the report. CT Chest  W Contrast Result Date: 09/24/2023 CLINICAL DATA:  Metastatic rectal cancer on palliative chemotherapy. Restaging. * Tracking Code: BO * EXAM: CT CHEST WITH CONTRAST TECHNIQUE: Multidetector CT imaging of the chest was performed during intravenous contrast administration. RADIATION DOSE REDUCTION: This exam was performed according to the departmental dose-optimization program which includes automated exposure control, adjustment of the mA and/or kV according to patient size and/or use of iterative reconstruction technique. CONTRAST:  75mL OMNIPAQUE IOHEXOL  300 MG/ML  SOLN COMPARISON:  06/25/2023 chest CT.  07/09/2023 PET-CT. FINDINGS: Cardiovascular: Normal heart size. No significant pericardial effusion/thickening. Left anterior descending and right coronary atherosclerosis. Right internal jugular Port-A-Cath terminates at the cavoatrial junction. Atherosclerotic nonaneurysmal thoracic aorta. Dilated main pulmonary artery (3.5 cm diameter). No central pulmonary emboli. Mediastinum/Nodes: No significant thyroid nodules. Unremarkable esophagus. No pathologically enlarged axillary, mediastinal or hilar lymph nodes. Lungs/Pleura: No pneumothorax. Trace bilateral posterior pleural effusions. No acute consolidative airspace disease. Numerous (greater than 20) solid pulmonary nodules scattered throughout both lungs involving all lung lobes, all stable in size since 06/25/2023 chest CT with representative 1.6 x 1.4 cm medial right middle lobe nodule (series 4/image 101), 0.7 cm anterior left lower lobe nodule (series 4/image 110) and 0.7 cm left upper lobe nodule (series 4/image 64). No new significant pulmonary nodules. Upper abdomen: Numerous simple upper renal cysts bilaterally, largest 5.9 cm on the right, for which no follow-up imaging is recommended. Musculoskeletal: No aggressive appearing focal osseous lesions. Moderate thoracic spondylosis. IMPRESSION: 1. Bilateral pulmonary metastases are all stable from 06/25/2023 chest CT. 2. No thoracic adenopathy. No new or progressive metastatic disease in the chest. 3. Trace bilateral posterior pleural effusions. 4. Two-vessel coronary atherosclerosis. 5. Dilated main pulmonary artery, suggesting pulmonary arterial hypertension. 6.  Aortic Atherosclerosis (ICD10-I70.0). Electronically Signed   By: Delbert Phenix M.D.   On: 09/24/2023 19:15   DG ABD ACUTE 2+V W 1V CHEST Result Date: 09/19/2023 CLINICAL DATA:  Small bowel obstruction. EXAM: DG ABDOMEN ACUTE WITH 1 VIEW CHEST COMPARISON:  September 18, 2023. FINDINGS: Right  internal jugular Port-A-Cath is noted. Right basilar atelectasis or infiltrate is noted. Probable small pleural effusions are noted. Nasogastric tube tip seen in proximal stomach. No abnormal bowel dilatation is noted. Postsurgical changes are noted in right upper quadrant. IMPRESSION: Right basilar atelectasis or infiltrate with probable small pleural effusions. Nasogastric tube tip seen in proximal stomach. No abnormal bowel dilatation. Electronically Signed   By: Lupita Raider M.D.   On: 09/19/2023 11:20   DG Abd 1 View Result Date: 09/18/2023 CLINICAL DATA:  77 year old female with abdominal distension. Small-bowel obstruction. EXAM: ABDOMEN - 1 VIEW COMPARISON:  09/16/2023 radiograph and CT Abdomen and Pelvis. FINDINGS: Enteric tube remains in place, side hole at the level of the gastric body. Paucity of bowel gas similar to the CT scout view 2 days ago. Stable abdominal wall surgical clips or sutures. Stable lung bases. Stable visualized osseous structures. Vascular calcifications. IMPRESSION: 1. Stable and satisfactory enteric tube placement. 2. Persistent paucity of bowel gas similar to the CT scout view. Residual small-bowel obstruction difficult to exclude. Electronically Signed   By: Odessa Fleming M.D.   On: 09/18/2023 08:37   DG Abd 1 View Result Date: 09/16/2023 CLINICAL DATA:  X-ray for NG tube placement EXAM: ABDOMEN - 1 VIEW limited for tube placement COMPARISON:  CT earlier 09/16/2023 FINDINGS: Limited x-ray of the upper abdomen demonstrates enteric tube with tip overlying the midbody of the stomach. Surgical changes in the midabdomen. Overlapping cardiac leads. IMPRESSION:  Limited x-ray for tube placement has a enteric tube with tip overlying the midbody of the stomach. Electronically Signed   By: Karen Kays M.D.   On: 09/16/2023 18:09   CT ABDOMEN PELVIS W CONTRAST Result Date: 09/16/2023 CLINICAL DATA:  Abdominal pain and nausea EXAM: CT ABDOMEN AND PELVIS WITH CONTRAST TECHNIQUE:  Multidetector CT imaging of the abdomen and pelvis was performed using the standard protocol following bolus administration of intravenous contrast. RADIATION DOSE REDUCTION: This exam was performed according to the departmental dose-optimization program which includes automated exposure control, adjustment of the mA and/or kV according to patient size and/or use of iterative reconstruction technique. CONTRAST:  80mL OMNIPAQUE IOHEXOL 300 MG/ML  SOLN COMPARISON:  06/20/2023 FINDINGS: Lower chest: 1.8 x 1.4 cm right middle lobe nodule (series 4, image 16), not significantly changed in size. Additional scattered subcentimeter bibasilar nodules are also unchanged. Mild bibasilar subsegmental atelectasis. Circumferential thickening of the distal esophagus. Hepatobiliary: Stable 8 mm subcapsular lesion in the anterior inferior right hepatic lobe (series 2, image 37). No new focal liver abnormality. Prior cholecystectomy with similar degree of biliary dilatation. Pancreas: Mild ductal dilatation without focal lesion or inflammatory changes. Spleen: Stable splenic cyst.  No splenomegaly. Adrenals/Urinary Tract: Unremarkable adrenal glands. Multiple bilateral renal cysts which require no follow-up imaging. No renal stone or hydronephrosis. Urinary bladder within normal limits. Stomach/Bowel: Prior colon resection with left anterior abdominal wall colostomy. Adjacent parastomal hernia. Small volume fluid within the hernia sac. Stomach is distended. Multiple dilated loops of small bowel throughout the abdomen with transition point in the anterior aspect of the lower abdomen, left of midline (series 2, image 54). Normal appendix in the right lower quadrant. Vascular/Lymphatic: Aortoiliac atherosclerosis. Enlarged right external iliac chain lymph node measuring 1.4 cm (series 2, image 68), previously 1.3 cm. Several mildly prominent retroperitoneal lymph nodes have increased in size from prior but remain subcentimeter in size.  Reproductive: Status post hysterectomy. No adnexal masses. Other: Small volume ascites.  No pneumoperitoneum. Musculoskeletal: Permeative metastatic lesion the left iliac wing not appreciably changed. No pathologic fracture. No new bony lesion identified. IMPRESSION: 1. Small-bowel obstruction with transition point in the anterior aspect of the lower abdomen, left of midline. This may be secondary to adhesions. 2. Small volume ascites. 3. Prior colon resection with left anterior abdominal wall colostomy. Adjacent parastomal hernia. Small volume fluid within the hernia sac. 4. Stable pulmonary metastatic disease. 5. Stable metastatic lesion of the left iliac wing. 6. Enlarged right external iliac chain lymph node measuring 1.4 cm, previously 1.3 cm. Several mildly prominent retroperitoneal lymph nodes have increased in size from prior but remain subcentimeter in size. 7. Aortic atherosclerosis (ICD10-I70.0). Electronically Signed   By: Duanne Guess D.O.   On: 09/16/2023 14:07   NM PET Image Restag (PS) Skull Base To Thigh Result Date: 07/11/2023 CLINICAL DATA:  Subsequent treatment strategy for colorectal carcinoma. EXAM: NUCLEAR MEDICINE PET SKULL BASE TO THIGH TECHNIQUE: 8.66 mCi F-18 FDG was injected intravenously. Full-ring PET imaging was performed from the skull base to thigh after the radiotracer. CT data was obtained and used for attenuation correction and anatomic localization. Fasting blood glucose: 64 mg/dl COMPARISON:  CT chest, abdomen and pelvis 06/25/2023 and PET-CT from 09/19/2022 FINDINGS: Mediastinal blood pool activity: SUV max 2.37 Liver activity: SUV max NA NECK: No hypermetabolic lymph nodes in the neck. Incidental CT findings: None. CHEST: Left supraclavicular lymph node measures 0.6 cm within SUV max of 4.85, image 32/6. This is new when compared with the  previous PET-CT. On 06/25/2023 this measured the same. Multifocal bilateral pulmonary nodules are identified compatible with  pulmonary metastasis. -Index nodule within the right middle lobe measures 1.4 cm with SUV max of 4.30 unchanged in size from 06/25/2023. On the PET-CT from 09/19/2022 this measured 1.2 cm and had an SUV max of 0.74. -Additional scattered lung nodules are identified throughout both lungs most of which are too small to characterize by PET-CT. The largest of these is in the anteromedial right middle lobe measuring 8 mm with SUV max of 1.22, image 37/2. Stable from 06/25/2023. On the PET-CT from 09/19/2022 this measured 8 mm with SUV max of 1.17. Incidental CT findings: No pleural effusion or consolidative change. Aortic atherosclerotic calcifications. ABDOMEN/PELVIS: No tracer avid liver metastases. There is no abnormal tracer uptake identified within the liver, pancreas, or spleen. Multifocal tracer avid abdominopelvic lymph nodes are identified. Tracer avid left periaortic lymph node measures 8 mm within SUV max of 8.36. This is new compared with the previous exam. Right common iliac lymph node measures 1 cm and has an SUV max of 6.6, image 107/6. This is also new compared with the prior exam. Right external iliac lymph node measures 7 mm within SUV max of 6.06. This is similar to the recent CT from 06/25/2023. On the previous PET-CT from 09/19/22 this measured 7 mm with SUV max of 7.7. More distally, there is a new tracer avid right external iliac lymph node which measures 1.4 cm within SUV max of 6.63. Right inguinal lymph node measures 1.1 cm within SUV max of 5.16. Formally this measured 0.9 cm with SUV max of 5.13. No tracer avid peritoneal nodule Incidental CT findings: Left lower quadrant descending colostomy. Aortic atherosclerosis. SKELETON: Diffuse increased radiotracer uptake throughout the marrow space is identified diminishing sensitivity for detecting osseous metastasis. No abnormal increased radiotracer uptake identified above background increased bone marrow activity to indicate metabolically active  bone metastases. Incidental CT findings: None. IMPRESSION: 1. Multifocal tracer avid abdominopelvic lymph nodes are identified compatible with metastatic disease. Although similar when compared with 06/25/2023, there are new tracer avid abdominopelvic lymph nodes identified when compared with the previous PET-CT from 09/19/2022. 2. Multifocal bilateral pulmonary nodules are identified compatible with pulmonary metastasis. The largest of these is in the right middle lobe measuring 1.4 cm with SUV max of 4.30. This is unchanged in size from 06/25/2023. On the PET-CT from 09/19/2022 this measured 1.2 cm and had an SUV max of 0.74. The remaining lung nodules are too small to characterize by PET-CT. 3. Diffuse increased radiotracer uptake throughout the marrow space is identified diminishing sensitivity for detecting osseous metastasis. No abnormal increased radiotracer uptake identified above background increased bone marrow activity to indicate metabolically active bone metastases. 4.  Aortic Atherosclerosis (ICD10-I70.0). Electronically Signed   By: Signa Kell M.D.   On: 07/11/2023 12:08      CT Chest W Contrast Result Date: 09/24/2023 CLINICAL DATA:  Metastatic rectal cancer on palliative chemotherapy. Restaging. * Tracking Code: BO * EXAM: CT CHEST WITH CONTRAST TECHNIQUE: Multidetector CT imaging of the chest was performed during intravenous contrast administration. RADIATION DOSE REDUCTION: This exam was performed according to the departmental dose-optimization program which includes automated exposure control, adjustment of the mA and/or kV according to patient size and/or use of iterative reconstruction technique. CONTRAST:  75mL OMNIPAQUE IOHEXOL 300 MG/ML  SOLN COMPARISON:  06/25/2023 chest CT.  07/09/2023 PET-CT. FINDINGS: Cardiovascular: Normal heart size. No significant pericardial effusion/thickening. Left anterior descending and right coronary  atherosclerosis. Right internal jugular Port-A-Cath  terminates at the cavoatrial junction. Atherosclerotic nonaneurysmal thoracic aorta. Dilated main pulmonary artery (3.5 cm diameter). No central pulmonary emboli. Mediastinum/Nodes: No significant thyroid nodules. Unremarkable esophagus. No pathologically enlarged axillary, mediastinal or hilar lymph nodes. Lungs/Pleura: No pneumothorax. Trace bilateral posterior pleural effusions. No acute consolidative airspace disease. Numerous (greater than 20) solid pulmonary nodules scattered throughout both lungs involving all lung lobes, all stable in size since 06/25/2023 chest CT with representative 1.6 x 1.4 cm medial right middle lobe nodule (series 4/image 101), 0.7 cm anterior left lower lobe nodule (series 4/image 110) and 0.7 cm left upper lobe nodule (series 4/image 64). No new significant pulmonary nodules. Upper abdomen: Numerous simple upper renal cysts bilaterally, largest 5.9 cm on the right, for which no follow-up imaging is recommended. Musculoskeletal: No aggressive appearing focal osseous lesions. Moderate thoracic spondylosis. IMPRESSION: 1. Bilateral pulmonary metastases are all stable from 06/25/2023 chest CT. 2. No thoracic adenopathy. No new or progressive metastatic disease in the chest. 3. Trace bilateral posterior pleural effusions. 4. Two-vessel coronary atherosclerosis. 5. Dilated main pulmonary artery, suggesting pulmonary arterial hypertension. 6.  Aortic Atherosclerosis (ICD10-I70.0). Electronically Signed   By: Delbert Phenix M.D.   On: 09/24/2023 19:15   DG ABD ACUTE 2+V W 1V CHEST Result Date: 09/19/2023 CLINICAL DATA:  Small bowel obstruction. EXAM: DG ABDOMEN ACUTE WITH 1 VIEW CHEST COMPARISON:  September 18, 2023. FINDINGS: Right internal jugular Port-A-Cath is noted. Right basilar atelectasis or infiltrate is noted. Probable small pleural effusions are noted. Nasogastric tube tip seen in proximal stomach. No abnormal bowel dilatation is noted. Postsurgical changes are noted in right upper  quadrant. IMPRESSION: Right basilar atelectasis or infiltrate with probable small pleural effusions. Nasogastric tube tip seen in proximal stomach. No abnormal bowel dilatation. Electronically Signed   By: Lupita Raider M.D.   On: 09/19/2023 11:20   DG Abd 1 View Result Date: 09/18/2023 CLINICAL DATA:  77 year old female with abdominal distension. Small-bowel obstruction. EXAM: ABDOMEN - 1 VIEW COMPARISON:  09/16/2023 radiograph and CT Abdomen and Pelvis. FINDINGS: Enteric tube remains in place, side hole at the level of the gastric body. Paucity of bowel gas similar to the CT scout view 2 days ago. Stable abdominal wall surgical clips or sutures. Stable lung bases. Stable visualized osseous structures. Vascular calcifications. IMPRESSION: 1. Stable and satisfactory enteric tube placement. 2. Persistent paucity of bowel gas similar to the CT scout view. Residual small-bowel obstruction difficult to exclude. Electronically Signed   By: Odessa Fleming M.D.   On: 09/18/2023 08:37   DG Abd 1 View Result Date: 09/16/2023 CLINICAL DATA:  X-ray for NG tube placement EXAM: ABDOMEN - 1 VIEW limited for tube placement COMPARISON:  CT earlier 09/16/2023 FINDINGS: Limited x-ray of the upper abdomen demonstrates enteric tube with tip overlying the midbody of the stomach. Surgical changes in the midabdomen. Overlapping cardiac leads. IMPRESSION: Limited x-ray for tube placement has a enteric tube with tip overlying the midbody of the stomach. Electronically Signed   By: Karen Kays M.D.   On: 09/16/2023 18:09   CT ABDOMEN PELVIS W CONTRAST Result Date: 09/16/2023 CLINICAL DATA:  Abdominal pain and nausea EXAM: CT ABDOMEN AND PELVIS WITH CONTRAST TECHNIQUE: Multidetector CT imaging of the abdomen and pelvis was performed using the standard protocol following bolus administration of intravenous contrast. RADIATION DOSE REDUCTION: This exam was performed according to the departmental dose-optimization program which includes  automated exposure control, adjustment of the mA and/or kV according to patient  size and/or use of iterative reconstruction technique. CONTRAST:  80mL OMNIPAQUE IOHEXOL 300 MG/ML  SOLN COMPARISON:  06/20/2023 FINDINGS: Lower chest: 1.8 x 1.4 cm right middle lobe nodule (series 4, image 16), not significantly changed in size. Additional scattered subcentimeter bibasilar nodules are also unchanged. Mild bibasilar subsegmental atelectasis. Circumferential thickening of the distal esophagus. Hepatobiliary: Stable 8 mm subcapsular lesion in the anterior inferior right hepatic lobe (series 2, image 37). No new focal liver abnormality. Prior cholecystectomy with similar degree of biliary dilatation. Pancreas: Mild ductal dilatation without focal lesion or inflammatory changes. Spleen: Stable splenic cyst.  No splenomegaly. Adrenals/Urinary Tract: Unremarkable adrenal glands. Multiple bilateral renal cysts which require no follow-up imaging. No renal stone or hydronephrosis. Urinary bladder within normal limits. Stomach/Bowel: Prior colon resection with left anterior abdominal wall colostomy. Adjacent parastomal hernia. Small volume fluid within the hernia sac. Stomach is distended. Multiple dilated loops of small bowel throughout the abdomen with transition point in the anterior aspect of the lower abdomen, left of midline (series 2, image 54). Normal appendix in the right lower quadrant. Vascular/Lymphatic: Aortoiliac atherosclerosis. Enlarged right external iliac chain lymph node measuring 1.4 cm (series 2, image 68), previously 1.3 cm. Several mildly prominent retroperitoneal lymph nodes have increased in size from prior but remain subcentimeter in size. Reproductive: Status post hysterectomy. No adnexal masses. Other: Small volume ascites.  No pneumoperitoneum. Musculoskeletal: Permeative metastatic lesion the left iliac wing not appreciably changed. No pathologic fracture. No new bony lesion identified. IMPRESSION: 1.  Small-bowel obstruction with transition point in the anterior aspect of the lower abdomen, left of midline. This may be secondary to adhesions. 2. Small volume ascites. 3. Prior colon resection with left anterior abdominal wall colostomy. Adjacent parastomal hernia. Small volume fluid within the hernia sac. 4. Stable pulmonary metastatic disease. 5. Stable metastatic lesion of the left iliac wing. 6. Enlarged right external iliac chain lymph node measuring 1.4 cm, previously 1.3 cm. Several mildly prominent retroperitoneal lymph nodes have increased in size from prior but remain subcentimeter in size. 7. Aortic atherosclerosis (ICD10-I70.0). Electronically Signed   By: Duanne Guess D.O.   On: 09/16/2023 14:07   NM PET Image Restag (PS) Skull Base To Thigh Result Date: 07/11/2023 CLINICAL DATA:  Subsequent treatment strategy for colorectal carcinoma. EXAM: NUCLEAR MEDICINE PET SKULL BASE TO THIGH TECHNIQUE: 8.66 mCi F-18 FDG was injected intravenously. Full-ring PET imaging was performed from the skull base to thigh after the radiotracer. CT data was obtained and used for attenuation correction and anatomic localization. Fasting blood glucose: 64 mg/dl COMPARISON:  CT chest, abdomen and pelvis 06/25/2023 and PET-CT from 09/19/2022 FINDINGS: Mediastinal blood pool activity: SUV max 2.37 Liver activity: SUV max NA NECK: No hypermetabolic lymph nodes in the neck. Incidental CT findings: None. CHEST: Left supraclavicular lymph node measures 0.6 cm within SUV max of 4.85, image 32/6. This is new when compared with the previous PET-CT. On 06/25/2023 this measured the same. Multifocal bilateral pulmonary nodules are identified compatible with pulmonary metastasis. -Index nodule within the right middle lobe measures 1.4 cm with SUV max of 4.30 unchanged in size from 06/25/2023. On the PET-CT from 09/19/2022 this measured 1.2 cm and had an SUV max of 0.74. -Additional scattered lung nodules are identified throughout  both lungs most of which are too small to characterize by PET-CT. The largest of these is in the anteromedial right middle lobe measuring 8 mm with SUV max of 1.22, image 37/2. Stable from 06/25/2023. On the PET-CT from 09/19/2022 this  measured 8 mm with SUV max of 1.17. Incidental CT findings: No pleural effusion or consolidative change. Aortic atherosclerotic calcifications. ABDOMEN/PELVIS: No tracer avid liver metastases. There is no abnormal tracer uptake identified within the liver, pancreas, or spleen. Multifocal tracer avid abdominopelvic lymph nodes are identified. Tracer avid left periaortic lymph node measures 8 mm within SUV max of 8.36. This is new compared with the previous exam. Right common iliac lymph node measures 1 cm and has an SUV max of 6.6, image 107/6. This is also new compared with the prior exam. Right external iliac lymph node measures 7 mm within SUV max of 6.06. This is similar to the recent CT from 06/25/2023. On the previous PET-CT from 09/19/22 this measured 7 mm with SUV max of 7.7. More distally, there is a new tracer avid right external iliac lymph node which measures 1.4 cm within SUV max of 6.63. Right inguinal lymph node measures 1.1 cm within SUV max of 5.16. Formally this measured 0.9 cm with SUV max of 5.13. No tracer avid peritoneal nodule Incidental CT findings: Left lower quadrant descending colostomy. Aortic atherosclerosis. SKELETON: Diffuse increased radiotracer uptake throughout the marrow space is identified diminishing sensitivity for detecting osseous metastasis. No abnormal increased radiotracer uptake identified above background increased bone marrow activity to indicate metabolically active bone metastases. Incidental CT findings: None. IMPRESSION: 1. Multifocal tracer avid abdominopelvic lymph nodes are identified compatible with metastatic disease. Although similar when compared with 06/25/2023, there are new tracer avid abdominopelvic lymph nodes identified  when compared with the previous PET-CT from 09/19/2022. 2. Multifocal bilateral pulmonary nodules are identified compatible with pulmonary metastasis. The largest of these is in the right middle lobe measuring 1.4 cm with SUV max of 4.30. This is unchanged in size from 06/25/2023. On the PET-CT from 09/19/2022 this measured 1.2 cm and had an SUV max of 0.74. The remaining lung nodules are too small to characterize by PET-CT. 3. Diffuse increased radiotracer uptake throughout the marrow space is identified diminishing sensitivity for detecting osseous metastasis. No abnormal increased radiotracer uptake identified above background increased bone marrow activity to indicate metabolically active bone metastases. 4.  Aortic Atherosclerosis (ICD10-I70.0). Electronically Signed   By: Signa Kell M.D.   On: 07/11/2023 12:08

## 2023-10-04 DIAGNOSIS — Z87891 Personal history of nicotine dependence: Secondary | ICD-10-CM | POA: Diagnosis not present

## 2023-10-04 DIAGNOSIS — B372 Candidiasis of skin and nail: Secondary | ICD-10-CM | POA: Diagnosis not present

## 2023-10-04 DIAGNOSIS — Z79899 Other long term (current) drug therapy: Secondary | ICD-10-CM | POA: Diagnosis not present

## 2023-10-04 DIAGNOSIS — L89152 Pressure ulcer of sacral region, stage 2: Secondary | ICD-10-CM | POA: Diagnosis not present

## 2023-10-04 DIAGNOSIS — Z7901 Long term (current) use of anticoagulants: Secondary | ICD-10-CM | POA: Diagnosis not present

## 2023-10-04 DIAGNOSIS — I10 Essential (primary) hypertension: Secondary | ICD-10-CM | POA: Diagnosis not present

## 2023-10-04 DIAGNOSIS — Z556 Problems related to health literacy: Secondary | ICD-10-CM | POA: Diagnosis not present

## 2023-10-08 DIAGNOSIS — B372 Candidiasis of skin and nail: Secondary | ICD-10-CM | POA: Diagnosis not present

## 2023-10-08 DIAGNOSIS — Z556 Problems related to health literacy: Secondary | ICD-10-CM | POA: Diagnosis not present

## 2023-10-08 DIAGNOSIS — Z7901 Long term (current) use of anticoagulants: Secondary | ICD-10-CM | POA: Diagnosis not present

## 2023-10-08 DIAGNOSIS — Z87891 Personal history of nicotine dependence: Secondary | ICD-10-CM | POA: Diagnosis not present

## 2023-10-08 DIAGNOSIS — I10 Essential (primary) hypertension: Secondary | ICD-10-CM | POA: Diagnosis not present

## 2023-10-08 DIAGNOSIS — Z79899 Other long term (current) drug therapy: Secondary | ICD-10-CM | POA: Diagnosis not present

## 2023-10-08 DIAGNOSIS — L89152 Pressure ulcer of sacral region, stage 2: Secondary | ICD-10-CM | POA: Diagnosis not present

## 2023-10-09 DIAGNOSIS — Z87891 Personal history of nicotine dependence: Secondary | ICD-10-CM | POA: Diagnosis not present

## 2023-10-09 DIAGNOSIS — I10 Essential (primary) hypertension: Secondary | ICD-10-CM | POA: Diagnosis not present

## 2023-10-09 DIAGNOSIS — Z556 Problems related to health literacy: Secondary | ICD-10-CM | POA: Diagnosis not present

## 2023-10-09 DIAGNOSIS — Z7901 Long term (current) use of anticoagulants: Secondary | ICD-10-CM | POA: Diagnosis not present

## 2023-10-09 DIAGNOSIS — L89152 Pressure ulcer of sacral region, stage 2: Secondary | ICD-10-CM | POA: Diagnosis not present

## 2023-10-09 DIAGNOSIS — Z933 Colostomy status: Secondary | ICD-10-CM | POA: Diagnosis not present

## 2023-10-09 DIAGNOSIS — B372 Candidiasis of skin and nail: Secondary | ICD-10-CM | POA: Diagnosis not present

## 2023-10-09 DIAGNOSIS — Z79899 Other long term (current) drug therapy: Secondary | ICD-10-CM | POA: Diagnosis not present

## 2023-10-11 ENCOUNTER — Telehealth: Payer: Self-pay

## 2023-10-11 DIAGNOSIS — Z933 Colostomy status: Secondary | ICD-10-CM | POA: Diagnosis not present

## 2023-10-11 NOTE — Telephone Encounter (Signed)
Returned call and advised per Dr. Kirtland Bouchard.

## 2023-10-11 NOTE — Telephone Encounter (Signed)
Okay to proceed w/ verbal orders  Saralyn Pilar, DO [facility] 10/11/2023, 9:11 AM

## 2023-10-11 NOTE — Telephone Encounter (Signed)
Copied from CRM [id number]. Topic: Quick Communication - Home Health Verbal Orders >> Oct 09, 2023 11:30 AM Macon Large wrote: Caller/Agency: [name] with [facility] Callback Number: [phone] Option 2 Service Requested: Physical Therapy Frequency: 1 x 5 Any new concerns about the patient? No

## 2023-10-16 ENCOUNTER — Encounter: Payer: Self-pay | Admitting: Oncology

## 2023-10-16 ENCOUNTER — Inpatient Hospital Stay (HOSPITAL_BASED_OUTPATIENT_CLINIC_OR_DEPARTMENT_OTHER): Payer: 59 | Admitting: Oncology

## 2023-10-16 ENCOUNTER — Inpatient Hospital Stay: Payer: 59

## 2023-10-16 VITALS — BP 140/67 | HR 79 | Temp 99.2°F | Resp 18 | Wt 163.0 lb

## 2023-10-16 DIAGNOSIS — K521 Toxic gastroenteritis and colitis: Secondary | ICD-10-CM

## 2023-10-16 DIAGNOSIS — Z86711 Personal history of pulmonary embolism: Secondary | ICD-10-CM | POA: Diagnosis not present

## 2023-10-16 DIAGNOSIS — C7801 Secondary malignant neoplasm of right lung: Secondary | ICD-10-CM | POA: Diagnosis not present

## 2023-10-16 DIAGNOSIS — T451X5A Adverse effect of antineoplastic and immunosuppressive drugs, initial encounter: Secondary | ICD-10-CM

## 2023-10-16 DIAGNOSIS — Z86718 Personal history of other venous thrombosis and embolism: Secondary | ICD-10-CM | POA: Diagnosis not present

## 2023-10-16 DIAGNOSIS — D6481 Anemia due to antineoplastic chemotherapy: Secondary | ICD-10-CM

## 2023-10-16 DIAGNOSIS — Z87891 Personal history of nicotine dependence: Secondary | ICD-10-CM | POA: Diagnosis not present

## 2023-10-16 DIAGNOSIS — C799 Secondary malignant neoplasm of unspecified site: Secondary | ICD-10-CM

## 2023-10-16 DIAGNOSIS — Z95828 Presence of other vascular implants and grafts: Secondary | ICD-10-CM | POA: Diagnosis not present

## 2023-10-16 DIAGNOSIS — C20 Malignant neoplasm of rectum: Secondary | ICD-10-CM

## 2023-10-16 DIAGNOSIS — Z5111 Encounter for antineoplastic chemotherapy: Secondary | ICD-10-CM | POA: Diagnosis not present

## 2023-10-16 DIAGNOSIS — Z800 Family history of malignant neoplasm of digestive organs: Secondary | ICD-10-CM | POA: Diagnosis not present

## 2023-10-16 DIAGNOSIS — C7951 Secondary malignant neoplasm of bone: Secondary | ICD-10-CM | POA: Diagnosis not present

## 2023-10-16 DIAGNOSIS — C778 Secondary and unspecified malignant neoplasm of lymph nodes of multiple regions: Secondary | ICD-10-CM | POA: Diagnosis not present

## 2023-10-16 DIAGNOSIS — E876 Hypokalemia: Secondary | ICD-10-CM

## 2023-10-16 DIAGNOSIS — C7802 Secondary malignant neoplasm of left lung: Secondary | ICD-10-CM | POA: Diagnosis not present

## 2023-10-16 DIAGNOSIS — Z7901 Long term (current) use of anticoagulants: Secondary | ICD-10-CM | POA: Diagnosis not present

## 2023-10-16 DIAGNOSIS — C19 Malignant neoplasm of rectosigmoid junction: Secondary | ICD-10-CM | POA: Diagnosis not present

## 2023-10-16 DIAGNOSIS — G629 Polyneuropathy, unspecified: Secondary | ICD-10-CM | POA: Diagnosis not present

## 2023-10-16 LAB — CBC WITH DIFFERENTIAL (CANCER CENTER ONLY)
Abs Immature Granulocytes: 0.02 10*3/uL (ref 0.00–0.07)
Basophils Absolute: 0 10*3/uL (ref 0.0–0.1)
Basophils Relative: 0 %
Eosinophils Absolute: 0.2 10*3/uL (ref 0.0–0.5)
Eosinophils Relative: 3 %
HCT: 33.2 % — ABNORMAL LOW (ref 36.0–46.0)
Hemoglobin: 10.5 g/dL — ABNORMAL LOW (ref 12.0–15.0)
Immature Granulocytes: 0 %
Lymphocytes Relative: 23 %
Lymphs Abs: 1.2 10*3/uL (ref 0.7–4.0)
MCH: 29.5 pg (ref 26.0–34.0)
MCHC: 31.6 g/dL (ref 30.0–36.0)
MCV: 93.3 fL (ref 80.0–100.0)
Monocytes Absolute: 0.7 10*3/uL (ref 0.1–1.0)
Monocytes Relative: 13 %
Neutro Abs: 3.2 10*3/uL (ref 1.7–7.7)
Neutrophils Relative %: 61 %
Platelet Count: 209 10*3/uL (ref 150–400)
RBC: 3.56 MIL/uL — ABNORMAL LOW (ref 3.87–5.11)
RDW: 17.2 % — ABNORMAL HIGH (ref 11.5–15.5)
WBC Count: 5.3 10*3/uL (ref 4.0–10.5)
nRBC: 0 % (ref 0.0–0.2)

## 2023-10-16 LAB — CMP (CANCER CENTER ONLY)
ALT: 25 U/L (ref 0–44)
AST: 50 U/L — ABNORMAL HIGH (ref 15–41)
Albumin: 3.4 g/dL — ABNORMAL LOW (ref 3.5–5.0)
Alkaline Phosphatase: 54 U/L (ref 38–126)
Anion gap: 8 (ref 5–15)
BUN: 15 mg/dL (ref 8–23)
CO2: 25 mmol/L (ref 22–32)
Calcium: 9 mg/dL (ref 8.9–10.3)
Chloride: 104 mmol/L (ref 98–111)
Creatinine: 0.75 mg/dL (ref 0.44–1.00)
GFR, Estimated: >60 mL/min (ref >60)
Glucose, Bld: 113 mg/dL — ABNORMAL HIGH (ref 70–99)
Potassium: 4 mmol/L (ref 3.5–5.1)
Sodium: 137 mmol/L (ref 135–145)
Total Bilirubin: 0.6 mg/dL (ref 0.0–1.2)
Total Protein: 6.6 g/dL (ref 6.5–8.1)

## 2023-10-16 LAB — MAGNESIUM: Magnesium: 1.9 mg/dL (ref 1.7–2.4)

## 2023-10-16 MED ORDER — OXALIPLATIN CHEMO INJECTION 100 MG/20ML
70.0000 mg/m2 | Freq: Once | INTRAVENOUS | Status: AC
  Administered 2023-10-16: 125 mg via INTRAVENOUS
  Filled 2023-10-16: qty 25

## 2023-10-16 MED ORDER — LEUCOVORIN CALCIUM INJECTION 350 MG
400.0000 mg/m2 | Freq: Once | INTRAVENOUS | Status: AC
  Administered 2023-10-16: 724 mg via INTRAVENOUS
  Filled 2023-10-16: qty 36.2

## 2023-10-16 MED ORDER — PALONOSETRON HCL INJECTION 0.25 MG/5ML
0.2500 mg | Freq: Once | INTRAVENOUS | Status: AC
  Administered 2023-10-16: 0.25 mg via INTRAVENOUS
  Filled 2023-10-16: qty 5

## 2023-10-16 MED ORDER — DIPHENHYDRAMINE HCL 25 MG PO CAPS
25.0000 mg | ORAL_CAPSULE | Freq: Once | ORAL | Status: AC
Start: 2023-10-16 — End: 2023-10-16
  Administered 2023-10-16: 25 mg via ORAL
  Filled 2023-10-16: qty 1

## 2023-10-16 MED ORDER — SODIUM CHLORIDE 0.9 % IV SOLN
2400.0000 mg/m2 | INTRAVENOUS | Status: DC
  Administered 2023-10-16: 4350 mg via INTRAVENOUS
  Filled 2023-10-16: qty 87

## 2023-10-16 MED ORDER — DEXTROSE 5 % IV SOLN
Freq: Once | INTRAVENOUS | Status: AC
  Filled 2023-10-16: qty 250

## 2023-10-16 MED ORDER — DEXAMETHASONE SODIUM PHOSPHATE 10 MG/ML IJ SOLN
10.0000 mg | Freq: Once | INTRAMUSCULAR | Status: AC
  Administered 2023-10-16: 10 mg via INTRAVENOUS
  Filled 2023-10-16: qty 1

## 2023-10-16 MED ORDER — ACETAMINOPHEN 325 MG PO TABS
650.0000 mg | ORAL_TABLET | Freq: Once | ORAL | Status: AC
Start: 2023-10-16 — End: 2023-10-16
  Administered 2023-10-16: 650 mg via ORAL
  Filled 2023-10-16: qty 2

## 2023-10-16 NOTE — Assessment & Plan Note (Addendum)
 After today's chemotherapy, RN Amy reports that port has blood return but does not flush well.  Port was de-accessed and re-accessed, per [employer], no resistance with flushing and good blood return.

## 2023-10-16 NOTE — Assessment & Plan Note (Signed)
 If magnesium is 1.0 - 1.2:  administrate 6 gm IV magnesium sulfate If magnesium is 1.2-1.4, administrate 4 gm IV magnesium sulfate If magnesium is 1.5-1.6:  administrate 2 gm IV magnesium sulfate.  Continue slow mag orally 64mg  twice daily.

## 2023-10-16 NOTE — Progress Notes (Signed)
 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  Recent hospital admission for small bowel obstruction.  Receiving reduced fluorouracil  and oxaliplatin .  Progression noted.    Met with patient during infusion.  Sleepy from benadryl .  Reports that she has been limiting foods due to recent small bowel obstruction.  Eats toast, egg, bacon/sausage, potatoes for breakfast.  Lunch is peanut butter sandwich, water/gatorade.  Supper is soup or sandwich.  Likes ensure shakes and drinks 1-2 a day.      Medications: reviewed  Labs: glucose 113  Anthropometrics:   Weight 163 lb today  162 lb on 11/09/2021 (last seen by RD)   NUTRITION DIAGNOSIS: Altered GI function improving    INTERVENTION:  Reviewed foods to choose to help decrease risk of bowel obstruction.  Handout provided Encouraged continued oral nutrition supplements for added calories and protein.  Coupons provided    MONITORING, EVALUATION, GOAL: weight trends, intake   NEXT VISIT: Tuesday, Jan 28 during infusion  Lilac Hoff B. Dasie, RD, LDN Registered Dietitian [phone]

## 2023-10-16 NOTE — Assessment & Plan Note (Signed)
 Continue potassium chloride 20 mEq daily.

## 2023-10-16 NOTE — Progress Notes (Signed)
Patient here for oncology follow-up appointment, concerns of shoulder pain

## 2023-10-16 NOTE — Assessment & Plan Note (Addendum)
 History of stage IIIC Rectal cancer, s/p TNT, followed by 09/17/19 APR/posterior vaginectomy/TAH/BSO/VY-flap, pT4b pN0 with close vaginal margin 0.2 mm.  Uterus and ovaries negative for malignancy. palliative radiation to vaginal recurrence- 01/19/21 recurrence with lung metastasis.-Palliative -FOLFIRI plus bevacizumab .  Irinotecan  was dropped in November 2022 due to side effects. Negative for UGT1A1*28 - radiographically stable, rise of CEA-July 2023 CT lung metastasis worse--> Dec 2023 PET showed progression in pelvic lymph nodes and bone lesions,--> 2nd line irinotecan  +panitumumab -->CT March 2024 stable. --> June 2024 CT showed mild lung progression/Progressive left iliac bone metastasis, vulvar mass difficult for biopsy in office,  [facility] Liquid biopsy - no actionable variants --> 06/25/2023 CT showed progression of lung nodules [SLD increase 44mm] and iliac inguinal lymphadenopathy--> 07/23/23 switch to dose reduced FOLFOX   Labs are reviewed and discussed with patient, CEA nadir was in 490, currently gradually increasing CT chest abdomen pelvis w contrast showed overall stable disease.  She has tolerated FOLFOX well- grade 1 neuropathy Recommend continue dose reduced FOLFOX .

## 2023-10-16 NOTE — Assessment & Plan Note (Signed)
 Chemotherapy plan as listed above

## 2023-10-16 NOTE — Progress Notes (Addendum)
 Hematology/Oncology Progress note [phone] [phone]      CHIEF COMPLAINTS/REASON FOR VISIT:  Follow up for rectal cancer  ASSESSMENT & PLAN:   Cancer Staging  Rectal cancer [facility]) Staging form: Colon and Rectum, AJCC 8th Edition - Pathologic stage from 10/06/2019: Stage IIC (ypT4b, pN0, cM0) - Signed by Babara Call, MD on 10/06/2019 - Clinical stage from 06/30/2020: Jacqueline Yoder - Signed by Babara Call, MD on 04/17/2022 - Pathologic: Stage Unknown (rpTX, pNX, cM1) - Signed by Babara Call, MD on 01/31/2021   Rectal cancer [facility]) History of stage IIIC Rectal cancer, s/p TNT, followed by 09/17/19 APR/posterior vaginectomy/TAH/BSO/VY-flap, pT4b pN0 with close vaginal margin 0.2 mm.  Uterus and ovaries negative for malignancy. palliative radiation to vaginal recurrence- 01/19/21 recurrence with lung metastasis.-Palliative -FOLFIRI plus bevacizumab .  Irinotecan  was dropped in November 2022 due to side effects. Negative for UGT1A1*28 - radiographically stable, rise of CEA-July 2023 CT lung metastasis worse--> Dec 2023 PET showed progression in pelvic lymph nodes and bone lesions,--> 2nd line irinotecan  +panitumumab -->CT March 2024 stable. --> June 2024 CT showed mild lung progression/Progressive left iliac bone metastasis, vulvar mass difficult for biopsy in office,  Tempus Liquid biopsy - no actionable variants --> 06/25/2023 CT showed progression of lung nodules [SLD increase 78mm] and iliac inguinal lymphadenopathy--> 07/23/23 switch to dose reduced FOLFOX   Labs are reviewed and discussed with patient, CEA nadir was in 490, currently gradually increasing CT chest abdomen pelvis w contrast showed overall stable disease.  She has tolerated FOLFOX well- grade 1 neuropathy Recommend continue dose reduced FOLFOX .    Anemia due to antineoplastic chemotherapy Hb stable, observation  Encounter for antineoplastic chemotherapy Chemotherapy plan as listed above.   Hypomagnesemia If  magnesium  is 1.0 - 1.2:  administrate 6 gm IV magnesium  sulfate If magnesium  is 1.2-1.4, administrate 4 gm IV magnesium  sulfate If magnesium  is 1.5-1.6:  administrate 2 gm IV magnesium  sulfate.  Continue slow mag orally 64mg  twice daily.   Hypokalemia due to loss of potassium Continue potassium chloride  20 mEq daily.    Chemotherapy induced diarrhea continue lomotil   QID PRN as instructed.   History of pulmonary embolism History of PE and bilateral lower extremity DVT  05/18/23 Repeat right LE US  showed chronic non occlusive DVT Continue  Eliquis  2.5 mg twice daily for anticoagulation prophylaxis. Recommend leg elevation and compression stocking.   Port-A-Cath in place After today's chemotherapy, RN Amy reports that port has blood return but does not flush well.  Port was de-accessed and re-accessed, per [employer], no resistance with flushing and good blood return.    Follow up 2 weeks.  All questions were answered. The patient knows to call the clinic with any problems, questions or concerns.  Call Babara, MD, PhD [facility] Hematology Oncology 10/16/2023      HISTORY OF PRESENTING ILLNESS:  Oncology History  Rectal cancer (HCC)  01/23/2019 Initial Diagnosis   Rectal cancer [facility]) Patient initially presented with complaints of postmenopausal bleeding on 08/16/2018.  History of was menopausal vaginal bleeding in 2016 which resulted in cervical polypectomy.  Pathology 02/04/2015 showed cervical polyp, consistent with benign endometrial polyp.  [name] after polypectomy due to anxiety associated with pelvic exams.  pelvic exam on 08/16/2018 reviewed cervical abnormality and from enlarged uterus. Seen by Dr. Connell on 10/29/2018.  Endometrial biopsy and a Pap smear was performed. 10/29/2018 Pap smear showed adenocarcinoma, favor endometrial origin. 10/29/2018 endometrial biopsy showed endometrioid carcinoma, FIGO grade 1.  10/29/2018- TA &  TV Ultrasound revealed: Anteverted  uterus measuring 8.7 x 5.6 x 6.4 cm without evidence of focal masses.  The endometrium measuring 24.1 mm (thickened) and heterogeneous.  Right and left ovaries not visualized.  No adnexal masses identified.  No free fluid in cul-de-sac.  Patient was seen by Dr. Elby in clinic on 11/13/2018.  Cervical exam reveals 2 cm exophytic irregular mass consistent with malignancy.    11/19/2018 CT chest abdomen pelvis with contrast showed thickened endometrium with some irregularity compatible with the provided diagnosis of endometrial malignancy.  There is a mildly prominent left inguinal node 1.4 cm.  Patient was seen by Dr. Mancil on 11/20/2018 and left groin lymph node biopsy was recommended.  11/26/2018 patient underwent left inguinal lymph node biopsy. Pathology showed metastatic adenocarcinoma consistent with colorectal origin.  CDX 2+.  Case was discussed on tumor board.  Recommend colonoscopy for further evaluation.  Patient reports significant weight loss 30 pounds over the last year.  Chronic vaginal spotting. Change of bowel habits the past few months.  More constipated.  Family history positive for brother who has colon cancer prostate cancer.  patient has underwent colonoscopy on 12/03/2018 which reviewed a nonobstructing large mass in the rectum.  Also chronic fistula.  Mass was not circumferential.  This was biopsied with a cold forceps for histology.  Pathology came back hyperplastic polyp negative for dysplasia and malignancy. [name], patient underwent flex sigmoidoscopy on 12/06/2018 with rebiopsy of the rectal mass. This time biopsy results came back positive for invasive colorectal adenocarcinoma, moderately differentiated. Immunotherapy for nearly mismatch repair protein (MMR ) was performed.  There is no loss of MMR expression.  low probability of MSI high.   # Seen by [name] for evaluation of resectability for rectal cancer. In addition, she  also had a second opinion with [facility] pathology where her endometrial biopsy pathology was changed to  adenocarcinoma, consistent with colorectal primary.   [name]. She has home health that has been assisting with ostomy care  Patient was also evaluated by [facility] oncology.  Recommendation is to proceed with TNT with concurrent chemoradiation followed by neoadjuvant chemotherapy followed by surgical resection. Patient prefers to have treatment done locally with [facility].    02/04/2019 - 02/04/2019 Chemotherapy   The patient had capecitabine  (XELODA ) 150 MG tablet, 150 mg (100 % of original dose 150 mg), Oral, 2 times daily after meals, 1 of 1 cycle, Start date: 01/27/2019, End date: 04/03/2019 Dose modification: 150 mg (original dose 150 mg, Cycle 1) capecitabine  (XELODA ) 500 MG tablet, 1,500 mg (100 % of original dose 1,500 mg), Oral, 2 times daily after meals, 1 of 1 cycle, Start date: 01/27/2019, End date: 04/03/2019 Dose modification: 1,500 mg (original dose 1,500 mg, Cycle 1)  for chemotherapy treatment.    04/09/2019 - 07/18/2019 Chemotherapy   The patient had palonosetron  (ALOXI ) injection 0.25 mg, 0.25 mg, Intravenous,  Once, 8 of 8 cycles Administration: 0.25 mg (04/09/2019), 0.25 mg (04/23/2019), 0.25 mg (05/07/2019), 0.25 mg (05/21/2019), 0.25 mg (06/04/2019), 0.25 mg (06/18/2019), 0.25 mg (07/02/2019), 0.25 mg (07/16/2019) leucovorin  850 mg in dextrose  5 % 250 mL infusion, 844 mg, Intravenous,  Once, 8 of 8 cycles Administration: 850 mg (04/09/2019), 850 mg (04/23/2019), 850 mg (05/07/2019), 850 mg (05/21/2019), 800 mg (06/18/2019), 800 mg (07/02/2019), 800 mg (07/16/2019) oxaliplatin  (ELOXATIN ) 150 mg in dextrose  5 % 500 mL chemo infusion, 71 mg/m2 = 160 mg (100 % of original dose 75 mg/m2), Intravenous,  Once, 8 of 8 cycles Dose modification: 75 mg/m2 (original dose 75 mg/m2, Cycle 1, Reason: Provider Judgment) Administration: 150 mg (04/09/2019), 150 mg (04/23/2019), 150 mg (05/07/2019), 150  mg (05/21/2019), 150 mg (06/04/2019), 150 mg (06/18/2019), 150 mg (07/02/2019), 150 mg (07/16/2019) fluorouracil  (ADRUCIL ) 5,000 mg in sodium chloride  0.9 % 150 mL chemo infusion, 2,370 mg/m2 = 5,050 mg, Intravenous, 1 Day/Dose, 8 of 8 cycles Administration: 5,000 mg (04/09/2019), 5,000 mg (04/23/2019), 5,000 mg (05/07/2019), 5,000 mg (05/21/2019), 5,000 mg (06/04/2019), 5,000 mg (06/18/2019), 5,000 mg (07/02/2019), 5,000 mg (07/16/2019)  for chemotherapy treatment.    10/06/2019 Cancer Staging   Staging form: Colon and Rectum, AJCC 8th Edition - Pathologic stage from 10/06/2019: Stage IIC (ypT4b, pN0, cM0) - Signed by Babara Call, MD on 10/06/2019   06/30/2020 Cancer Staging   Staging form: Colon and Rectum, AJCC 8th Edition - Clinical stage from 06/30/2020: Jacqueline Yoder - Signed by Babara Call, MD on 04/17/2022 Stage prefix: Recurrence Total positive nodes: 1   07/16/2020 - 07/19/2020 Chemotherapy         01/31/2021 Cancer Staging   Staging form: Colon and Rectum, AJCC 8th Edition - Pathologic: Stage Unknown (rpTX, pNX, cM1) - Signed by Babara Call, MD on 01/31/2021 Stage prefix: Recurrence   07/14/2022 Imaging   Bone scan  1. No evidence of skeletal metastatic disease in the hips. 2. Asymmetric increased activity in the RIGHT humeral head is favored degenerative as described above. If pain in the RIGHT shoulder consider further evaluation with plain film or MRI   07/17/2022 Imaging   1. Stable scattered small bilateral pulmonary nodules, largest 8 by 6 mm in the right middle lobe. 2. Subtle chronically stable posterior cortical irregularity and heterogeneity in the left iliac bone, not appreciable on the bone scan of 07/14/2022. This has been present at least over the past year and could reflect radiation therapy related findings although strictly speaking, malignant involvement of the left iliac bone is difficult to exclude. If clinically warranted this could be further assessed with nuclear medicine PET-CT or  MRI of the bony pelvis with and without contrast. 3. Pelvic floor laxity with loops of small bowel extending 3 cm below the pubococcygeal line. 4. Aortic and systemic atherosclerosis as detailed above. Aortic Atherosclerosis    12/26/2022 Imaging   CT chest abdomen pelvis w contrast 1. Stable scattered solid pulmonary nodules. 2. Right inguinal and right pelvic lymph nodes which were hypermetabolic on prior PET-CT are stable. 3. Osseous lesion of the proximal right humerus demonstrates increased sclerosis when compared with the prior PET-CT, possibly due to treatment response. 4. 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 5. Aortic Atherosclerosis     06/25/2023 Imaging   CT chest abdomen pelvis w contrast showed 1. Increased size of the solid bilateral pulmonary nodules,consistent with worsening metastatic disease. 2. Increased size of the right external iliac and inguinal lymph nodes, concerning for worsening metastatic disease. 3. No significant interval change in the left iliac bone osseous metastasis. 4. Enlarged main pulmonary artery measuring 3.6 cm, which can be seen in the setting of pulmonary arterial hypertension. 5.  Aortic Atherosclerosis (ICD10-I70.0)     Miscellaneous   Tempus liquid biopsy showed no actionable variants.  NOTCH1 missense variant, BRCA2 missense variant , MYC missense variant   07/11/2023 Imaging   PET showed 1. Multifocal tracer avid abdominopelvic lymph nodes are identified compatible with metastatic disease. 2. Multifocal bilateral pulmonary nodules are identified compatible with pulmonary metastasis.  3. Diffuse increased radiotracer  uptake throughout the marrow space is identified diminishing sensitivity for detecting osseous metastasis. No abnormal increased radiotracer uptake identified above background increased bone marrow activity to indicate metabolically active bone metastases. 4.  Aortic Atherosclerosis   09/16/2023 Imaging   CT  abdomen pelvis w contrast  1. Small-bowel obstruction with transition point in the anterior aspect of the lower abdomen, left of midline. This may be secondary to adhesions. 2. Small volume ascites. 3. Prior colon resection with left anterior abdominal wall colostomy. Adjacent parastomal hernia. Small volume fluid within the hernia sac. 4. Stable pulmonary metastatic disease. 5. Stable metastatic lesion of the left iliac wing. 6. Enlarged right external iliac chain lymph node measuring 1.4 cm, previously 1.3 cm. Several mildly prominent retroperitoneal lymph nodes have increased in size from prior but remain subcentimeter in size. 7. Aortic atherosclerosis (ICD10-I70.0).   09/16/2023 - 09/20/2023 Hospital Admission   Hospitalized due to Small bowel obstruction due to adhesions    09/24/2023 Imaging   1. Bilateral pulmonary metastases are all stable from 06/25/2023 chest CT. 2. No thoracic adenopathy. No new or progressive metastatic disease in the chest. 3. Trace bilateral posterior pleural effusions. 4. Two-vessel coronary atherosclerosis. 5. Dilated main pulmonary artery, suggesting pulmonary arterial hypertension. 6.  Aortic Atherosclerosis (ICD10-I70.0).   Metastatic adenocarcinoma [facility])       # Oncology Treatment:  02/03/2019- 03/19/2019  concurrent Xeloda  and radiation.  Xeloda  dose 825mg  /m2 BID - rounded to 1650mg  BID- on days of radiation. 04/09/2019, started on FOLFOX with bolus early.  Omitted.  07/16/2019 finished 8 cycles of FOLFOX. 09/17/19 APR/posterior vaginectomy/TAH/BSO/VY-flap pT4b pN0 with close vaginal margin 0.2 mm.  Uterus and ovaries negative for malignancy. Patient reports bilateral lower extremity numbness and tingling, intermittent, left worse than right. She has lost a lot of weight since her APR surgery.   #Family history with half brother having's history of colon cancer prostate cancer.  Personal history of colorectal cancer.  Patient has not decided  if she wants genetic testing.    # history of PE( 01/13/2020)  in the bilateral lower extremity DVT (01/13/2020).   She finishes 6 months of anticoagulation with Eliquis  5 mg twice daily. Now switched to Eliquis  2.5 mg twice daily..  # She has now developed recurrent disease. #06/30/20  vaginal introitus mass biopsied. Pathology is consistent with metastatic colorectal adenocarcinoma I have discussed with [name]  Dr. Florie and the mass is not resectable. Patient has also had colonoscopy by Dr. Therisa yesterday. Normal examination. # 07/16/2020 cycle 1 FOLFIRI  # 07/20/2020 PET scan was done for further evaluation, images are consistent with local recurrence, no distant metastasis. #Discussed with radiation oncology Dr. Lenn will recommends concurrent chemotherapy and radiation. 08/02/2020-08/16/2020, patient starts radiation.  Xeloda  was held due to neutropenia 08/17/2020,-09/06/2020 Xeloda  1500 mg twice daily concurrently with radiation  01/31/21 started on FOLFIRI + Bev 05/18/2021 CT chest abdomen pelvis showed Previously noted enlargement of bilateral inguinal lymph nodes is resolved, consistent with treatment response of nodal metastatic disease. Interval decrease in size of multiple small bilateral pulmonary nodules, consistent with treatment response of pulmonary metastatic disease. No evidence of new metastatic disease. 05/24/2021 - 08/30/2021, continued on FOLFIRI plus bevacizumab .  Irinotecan  dose was reduced, eventually 100mg /m2  09/02/2021, CT chest abdomen pelvis without contrast Showed small bilateral pulmonary nodules, unchanged.  Stable metastatic disease.  No noncontrast evidence of new metastatic disease in the chest abdomen pelvis.  Small parastomal hernia.  Enlargement of main pulmonary artery.  Coronary artery disease.  09/13/2021, maintenance 5-FU/bevacizumab  11/28/2021, 5-FU/Irinotecan /bevacizumab .  Irinotecan  100 mg/m2 was added back due to progressively increasing  CEA.  -right lower extremity edema, US  showed non occlusive DVT, edema has improved.   INTERVAL HISTORY Jacqueline Yoder is a 77 y.o. female who has above history reviewed by me presents for follow-up of rectal cancer. On Irinotecan  and panitumumab . she tolerates well.   She was accompanied by her daughter. + she reports manageable neuropathy symptoms of her fingertips, intermittent,  worse when exposed to cold temperature.  + chronic loose ostomy output, stable, not worse.   Review of Systems  Constitutional:  Positive for fatigue. Negative for appetite change, chills, fever and unexpected weight change.  HENT:   Negative for hearing loss and voice change.   Eyes:  Negative for eye problems.  Respiratory:  Negative for chest tightness and cough.   Cardiovascular:  Negative for chest pain.  Gastrointestinal:  Positive for diarrhea. Negative for abdominal distention, abdominal pain, blood in stool, constipation and nausea.  Endocrine: Negative for hot flashes.  Genitourinary:  Positive for vaginal bleeding. Negative for difficulty urinating and frequency.   Musculoskeletal:  Positive for arthralgias.  Skin:  Negative for itching.  Neurological:  Positive for numbness. Negative for extremity weakness.  Hematological:  Negative for adenopathy.  Psychiatric/Behavioral:  Negative for confusion.     MEDICAL HISTORY:  Past Medical History:  Diagnosis Date   Allergy    Arthritis    Blood clot in vein    Family history of colon cancer    GERD (gastroesophageal reflux disease)    Hypercholesteremia    Hypertension    Hypertension    Lower extremity edema    Personal history of chemotherapy    Rectal cancer (HCC) 12/2018   Urinary incontinence     SURGICAL HISTORY: Past Surgical History:  Procedure Laterality Date   ABDOMINAL HYSTERECTOMY     CHOLECYSTECTOMY  1971   COLONOSCOPY WITH PROPOFOL  N/A 12/03/2018   Procedure: COLONOSCOPY WITH PROPOFOL ;  Surgeon: Jinny Carmine, MD;   Location: [facility] ENDOSCOPY;  Service: Endoscopy;  Laterality: N/A;   COLONOSCOPY WITH PROPOFOL  N/A 07/15/2020   Procedure: COLONOSCOPY WITH PROPOFOL ;  Surgeon: Therisa Bi, MD;  Location: [facility] ENDOSCOPY;  Service: Gastroenterology;  Laterality: N/A;   FLEXIBLE SIGMOIDOSCOPY N/A 12/06/2018   Procedure: FLEXIBLE SIGMOIDOSCOPY;  Surgeon: Therisa Bi, MD;  Location: [facility] ENDOSCOPY;  Service: Endoscopy;  Laterality: N/A;   LAPAROSCOPIC COLOSTOMY  01/06/2019   PORTACATH PLACEMENT N/A 04/03/2019   Procedure: INSERTION PORT-A-CATH;  Surgeon: Jordis Laneta JULIANNA, MD;  Location: [facility] ORS;  Service: General;  Laterality: N/A;    SOCIAL HISTORY: Social History   Socioeconomic History   Marital status: Widowed    Spouse name: Not on file   Number of children: Not on file   Years of education: Not on file   Highest education level: Some college, no degree  Occupational History   Not on file  Tobacco Use   Smoking status: Former    Current packs/day: 0.00    Types: Cigarettes    Quit date: 12/02/1977    Years since quitting: 45.9   Smokeless tobacco: Former  Building Services Engineer status: Never Used  Substance and Sexual Activity   Alcohol use: Never   Drug use: Never   Sexual activity: Not Currently    Birth control/protection: None  Other Topics Concern   Not on file  Social History Narrative   Lives with daughter   Social Drivers of Health  Financial Resource Strain: Low Risk  (09/26/2023)   Overall Financial Resource Strain (CARDIA)    Difficulty of Paying Living Expenses: Not very hard  Food Insecurity: No Food Insecurity (09/27/2023)   Hunger Vital Sign    Worried About Running Out of Food in the Last Year: Never true    Ran Out of Food in the Last Year: Never true  Transportation Needs: No Transportation Needs (09/27/2023)   PRAPARE - Administrator, Civil Service (Medical): No    Lack of Transportation (Non-Medical): No  Physical Activity: Insufficiently Active  (09/26/2023)   Exercise Vital Sign    Days of Exercise per Week: 2 days    Minutes of Exercise per Session: 10 min  Stress: No Stress Concern Present (09/26/2023)   [employer] of Occupational Health - Occupational Stress Questionnaire    Feeling of Stress : Not at all  Social Connections: Moderately Integrated (09/27/2023)   Social Connection and Isolation Panel [NHANES]    Frequency of Communication with Friends and Family: More than three times a week    Frequency of Social Gatherings with Friends and Family: More than three times a week    Attends Religious Services: More than 4 times per year    Active Member of [employer] or Organizations: Yes    Attends Banker Meetings: More than 4 times per year    Marital Status: Widowed  Recent Concern: Social Connections - Moderately Isolated (07/06/2023)   Social Connection and Isolation Panel [NHANES]    Frequency of Communication with Friends and Family: More than three times a week    Frequency of Social Gatherings with Friends and Family: More than three times a week    Attends Religious Services: More than 4 times per year    Active Member of [employer] or Organizations: No    Attends Banker Meetings: Never    Marital Status: Widowed  Intimate Partner Violence: Not At Risk (09/27/2023)   Humiliation, Afraid, Rape, and Kick questionnaire    Fear of Current or Ex-Partner: No    Emotionally Abused: No    Physically Abused: No    Sexually Abused: No    FAMILY HISTORY: Family History  Problem Relation Age of Onset   Colon cancer Brother 71       exposure to chemicals [place]   Hypertension Mother    Stroke Mother    Kidney failure Father    Breast cancer Neg Hx    Ovarian cancer Neg Hx     ALLERGIES:  is allergic to sulfamethoxazole-trimethoprim.  MEDICATIONS:  Current Outpatient Medications  Medication Sig Dispense Refill   acetaminophen  (TYLENOL ) 650 MG CR tablet Take 650 mg by mouth every 8 (eight)  hours as needed for pain.     Cholecalciferol  (VITAMIN D3) 2000 units capsule Take 2,000 Units by mouth daily.     clindamycin  (CLINDAGEL) 1 % gel Apply topically 2 (two) times daily. 30 g 5   diclofenac  sodium (VOLTAREN ) 1 % GEL Apply 2 g topically 4 (four) times daily as needed (joint pain).  11   diphenoxylate -atropine  (LOMOTIL ) 2.5-0.025 MG tablet Take 1 tablet by mouth 4 (four) times daily as needed for diarrhea or loose stools. 120 tablet 0   ELIQUIS  2.5 MG TABS tablet TAKE 1 TABLET BY MOUTH TWICE  DAILY 200 tablet 2   fluticasone  (FLONASE ) 50 MCG/ACT nasal spray Place 1 spray into both nostrils daily as needed for allergies or rhinitis.     gentamicin  cream (  GARAMYCIN ) 0.1 % Apply to affected toe once daily. 30 g 1   ipratropium (ATROVENT ) 0.03 % nasal spray USE 1 SPRAY IN BOTH NOSTRILS  TWICE DAILY (Patient taking differently: Place 1 spray into both nostrils 2 (two) times daily as needed for rhinitis.) 60 mL 2   ketoconazole  (NIZORAL ) 2 % cream Apply 1 Application topically daily. For up to 2 weeks. May repeat if need. 30 g 2   lidocaine -prilocaine  (EMLA ) cream Apply 1 application. topically as needed. 30 g 6   loratadine  (CLARITIN ) 10 MG tablet Take 10 mg by mouth daily.     magnesium  chloride (SLOW-MAG) 64 MG TBEC SR tablet Take 1 tablet (64 mg total) by mouth 2 (two) times daily. 60 tablet 2   Multiple Vitamins-Minerals (ONE-A-DAY WOMENS 50 PLUS PO) Take 1 tablet by mouth daily.      nystatin  (MYCOSTATIN /NYSTOP ) powder APPLY 1 POWDER TOPICALLY THREE TIMES DAILY 45 g 0   potassium chloride  SA (KLOR-CON  M) 20 MEQ tablet TAKE 1 TABLET BY MOUTH DAILY 100 tablet 2   simvastatin  (ZOCOR ) 40 MG tablet Take 1 tablet (40 mg total) by mouth at bedtime. 100 tablet 3   triamcinolone  cream (KENALOG ) 0.5 % Apply 1 Application topically 2 (two) times daily. To affected areas, for up to 2 weeks. 30 g 2   zinc  gluconate 50 MG tablet Take 50 mg by mouth daily.     No current facility-administered  medications for this visit.   Facility-Administered Medications Ordered in Other Visits  Medication Dose Route Frequency Provider Last Rate Last Admin   fluorouracil  (ADRUCIL ) 4,350 mg in sodium chloride  0.9 % 63 mL chemo infusion  2,400 mg/m2 (Treatment Plan Recorded) Intravenous 1 day or 1 dose Babara Call, MD       heparin  lock flush 100 UNIT/ML injection              PHYSICAL EXAMINATION: ECOG PERFORMANCE STATUS: 1 - Symptomatic but completely ambulatory  Physical Exam Constitutional:      General: She is not in acute distress. HENT:     Head: Normocephalic and atraumatic.  Eyes:     General: No scleral icterus. Cardiovascular:     Rate and Rhythm: Normal rate.     Heart sounds: Normal heart sounds.  Pulmonary:     Effort: Pulmonary effort is normal. No respiratory distress.     Breath sounds: No wheezing.  Abdominal:     General: Bowel sounds are normal. There is no distension.     Palpations: Abdomen is soft.     Comments: + Colostomy bag   Musculoskeletal:        General: No deformity. Normal range of motion.     Cervical back: Normal range of motion and neck supple.  Skin:    General: Skin is warm and dry.  Neurological:     Mental Status: She is alert and oriented to person, place, and time. Mental status is at baseline.     Cranial Nerves: No cranial nerve deficit.     Coordination: Coordination normal.       LABORATORY DATA:  I have reviewed the data as listed    Latest Ref Rng & Units 10/16/2023    9:11 AM 09/20/2023    4:26 AM 09/17/2023    5:40 AM  CBC  WBC 4.0 - 10.5 K/uL 5.3  3.3  3.1   Hemoglobin 12.0 - 15.0 g/dL 89.4  89.6  88.0   Hematocrit 36.0 - 46.0 % 33.2  31.0  36.2   Platelets 150 - 400 K/uL 209  176  194       Latest Ref Rng & Units 10/16/2023    9:11 AM 09/20/2023    4:26 AM 09/19/2023    4:12 AM  CMP  Glucose 70 - 99 mg/dL 886  95  63   BUN 8 - 23 mg/dL 15  9  14    Creatinine 0.44 - 1.00 mg/dL 9.24  9.18  9.18   Sodium 135 - 145  mmol/L 137  139  136   Potassium 3.5 - 5.1 mmol/L 4.0  3.9  3.1   Chloride 98 - 111 mmol/L 104  106  102   CO2 22 - 32 mmol/L 25  26  26    Calcium  8.9 - 10.3 mg/dL 9.0  8.2  8.1   Total Protein 6.5 - 8.1 g/dL 6.6     Total Bilirubin 0.0 - 1.2 mg/dL 0.6     Alkaline Phos 38 - 126 U/L 54     AST 15 - 41 U/L 50     ALT 0 - 44 U/L 25        RADIOGRAPHIC STUDIES: I have personally reviewed the radiological images as listed and agreed with the findings in the report. CT Chest W Contrast Result Date: 09/24/2023 CLINICAL DATA:  Metastatic rectal cancer on palliative chemotherapy. Restaging. * Tracking Code: BO * EXAM: CT CHEST WITH CONTRAST TECHNIQUE: Multidetector CT imaging of the chest was performed during intravenous contrast administration. RADIATION DOSE REDUCTION: This exam was performed according to the departmental dose-optimization program which includes automated exposure control, adjustment of the mA and/or kV according to patient size and/or use of iterative reconstruction technique. CONTRAST:  75mL OMNIPAQUE  IOHEXOL  300 MG/ML  SOLN COMPARISON:  06/25/2023 chest CT.  07/09/2023 PET-CT. FINDINGS: Cardiovascular: Normal heart size. No significant pericardial effusion/thickening. Left anterior descending and right coronary atherosclerosis. Right internal jugular Port-A-Cath terminates at the cavoatrial junction. Atherosclerotic nonaneurysmal thoracic aorta. Dilated main pulmonary artery (3.5 cm diameter). No central pulmonary emboli. Mediastinum/Nodes: No significant thyroid  nodules. Unremarkable esophagus. No pathologically enlarged axillary, mediastinal or hilar lymph nodes. Lungs/Pleura: No pneumothorax. Trace bilateral posterior pleural effusions. No acute consolidative airspace disease. Numerous (greater than 20) solid pulmonary nodules scattered throughout both lungs involving all lung lobes, all stable in size since 06/25/2023 chest CT with representative 1.6 x 1.4 cm medial right middle lobe  nodule (series 4/image 101), 0.7 cm anterior left lower lobe nodule (series 4/image 110) and 0.7 cm left upper lobe nodule (series 4/image 64). No new significant pulmonary nodules. Upper abdomen: Numerous simple upper renal cysts bilaterally, largest 5.9 cm on the right, for which no follow-up imaging is recommended. Musculoskeletal: No aggressive appearing focal osseous lesions. Moderate thoracic spondylosis. IMPRESSION: 1. Bilateral pulmonary metastases are all stable from 06/25/2023 chest CT. 2. No thoracic adenopathy. No new or progressive metastatic disease in the chest. 3. Trace bilateral posterior pleural effusions. 4. Two-vessel coronary atherosclerosis. 5. Dilated main pulmonary artery, suggesting pulmonary arterial hypertension. 6.  Aortic Atherosclerosis (ICD10-I70.0). Electronically Signed   By: Selinda DELENA Blue M.D.   On: 09/24/2023 19:15   DG ABD ACUTE 2+V W 1V CHEST Result Date: 09/19/2023 CLINICAL DATA:  Small bowel obstruction. EXAM: DG ABDOMEN ACUTE WITH 1 VIEW CHEST COMPARISON:  September 18, 2023. FINDINGS: Right internal jugular Port-A-Cath is noted. Right basilar atelectasis or infiltrate is noted. Probable small pleural effusions are noted. Nasogastric tube tip seen in proximal stomach. No abnormal bowel dilatation is noted. Postsurgical changes  are noted in right upper quadrant. IMPRESSION: Right basilar atelectasis or infiltrate with probable small pleural effusions. Nasogastric tube tip seen in proximal stomach. No abnormal bowel dilatation. Electronically Signed   By: Lynwood Landy Raddle M.D.   On: 09/19/2023 11:20   DG Abd 1 View Result Date: 09/18/2023 CLINICAL DATA:  77 year old female with abdominal distension. Small-bowel obstruction. EXAM: ABDOMEN - 1 VIEW COMPARISON:  09/16/2023 radiograph and CT Abdomen and Pelvis. FINDINGS: Enteric tube remains in place, side hole at the level of the gastric body. Paucity of bowel gas similar to the CT scout view 2 days ago. Stable abdominal wall  surgical clips or sutures. Stable lung bases. Stable visualized osseous structures. Vascular calcifications. IMPRESSION: 1. Stable and satisfactory enteric tube placement. 2. Persistent paucity of bowel gas similar to the CT scout view. Residual small-bowel obstruction difficult to exclude. Electronically Signed   By: VEAR Hurst M.D.   On: 09/18/2023 08:37   DG Abd 1 View Result Date: 09/16/2023 CLINICAL DATA:  X-ray for NG tube placement EXAM: ABDOMEN - 1 VIEW limited for tube placement COMPARISON:  CT earlier 09/16/2023 FINDINGS: Limited x-ray of the upper abdomen demonstrates enteric tube with tip overlying the midbody of the stomach. Surgical changes in the midabdomen. Overlapping cardiac leads. IMPRESSION: Limited x-ray for tube placement has a enteric tube with tip overlying the midbody of the stomach. Electronically Signed   By: Ranell Bring M.D.   On: 09/16/2023 18:09   CT ABDOMEN PELVIS W CONTRAST Result Date: 09/16/2023 CLINICAL DATA:  Abdominal pain and nausea EXAM: CT ABDOMEN AND PELVIS WITH CONTRAST TECHNIQUE: Multidetector CT imaging of the abdomen and pelvis was performed using the standard protocol following bolus administration of intravenous contrast. RADIATION DOSE REDUCTION: This exam was performed according to the departmental dose-optimization program which includes automated exposure control, adjustment of the mA and/or kV according to patient size and/or use of iterative reconstruction technique. CONTRAST:  80mL OMNIPAQUE  IOHEXOL  300 MG/ML  SOLN COMPARISON:  06/20/2023 FINDINGS: Lower chest: 1.8 x 1.4 cm right middle lobe nodule (series 4, image 16), not significantly changed in size. Additional scattered subcentimeter bibasilar nodules are also unchanged. Mild bibasilar subsegmental atelectasis. Circumferential thickening of the distal esophagus. Hepatobiliary: Stable 8 mm subcapsular lesion in the anterior inferior right hepatic lobe (series 2, image 37). No new focal liver abnormality.  Prior cholecystectomy with similar degree of biliary dilatation. Pancreas: Mild ductal dilatation without focal lesion or inflammatory changes. Spleen: Stable splenic cyst.  No splenomegaly. Adrenals/Urinary Tract: Unremarkable adrenal glands. Multiple bilateral renal cysts which require no follow-up imaging. No renal stone or hydronephrosis. Urinary bladder within normal limits. Stomach/Bowel: Prior colon resection with left anterior abdominal wall colostomy. Adjacent parastomal hernia. Small volume fluid within the hernia sac. Stomach is distended. Multiple dilated loops of small bowel throughout the abdomen with transition point in the anterior aspect of the lower abdomen, left of midline (series 2, image 54). Normal appendix in the right lower quadrant. Vascular/Lymphatic: Aortoiliac atherosclerosis. Enlarged right external iliac chain lymph node measuring 1.4 cm (series 2, image 68), previously 1.3 cm. Several mildly prominent retroperitoneal lymph nodes have increased in size from prior but remain subcentimeter in size. Reproductive: Status post hysterectomy. No adnexal masses. Other: Small volume ascites.  No pneumoperitoneum. Musculoskeletal: Permeative metastatic lesion the left iliac wing not appreciably changed. No pathologic fracture. No new bony lesion identified. IMPRESSION: 1. Small-bowel obstruction with transition point in the anterior aspect of the lower abdomen, left of midline. This may be secondary  to adhesions. 2. Small volume ascites. 3. Prior colon resection with left anterior abdominal wall colostomy. Adjacent parastomal hernia. Small volume fluid within the hernia sac. 4. Stable pulmonary metastatic disease. 5. Stable metastatic lesion of the left iliac wing. 6. Enlarged right external iliac chain lymph node measuring 1.4 cm, previously 1.3 cm. Several mildly prominent retroperitoneal lymph nodes have increased in size from prior but remain subcentimeter in size. 7. Aortic atherosclerosis  (ICD10-I70.0). Electronically Signed   By: Mabel Converse D.O.   On: 09/16/2023 14:07      CT Chest W Contrast Result Date: 09/24/2023 CLINICAL DATA:  Metastatic rectal cancer on palliative chemotherapy. Restaging. * Tracking Code: BO * EXAM: CT CHEST WITH CONTRAST TECHNIQUE: Multidetector CT imaging of the chest was performed during intravenous contrast administration. RADIATION DOSE REDUCTION: This exam was performed according to the departmental dose-optimization program which includes automated exposure control, adjustment of the mA and/or kV according to patient size and/or use of iterative reconstruction technique. CONTRAST:  75mL OMNIPAQUE  IOHEXOL  300 MG/ML  SOLN COMPARISON:  06/25/2023 chest CT.  07/09/2023 PET-CT. FINDINGS: Cardiovascular: Normal heart size. No significant pericardial effusion/thickening. Left anterior descending and right coronary atherosclerosis. Right internal jugular Port-A-Cath terminates at the cavoatrial junction. Atherosclerotic nonaneurysmal thoracic aorta. Dilated main pulmonary artery (3.5 cm diameter). No central pulmonary emboli. Mediastinum/Nodes: No significant thyroid  nodules. Unremarkable esophagus. No pathologically enlarged axillary, mediastinal or hilar lymph nodes. Lungs/Pleura: No pneumothorax. Trace bilateral posterior pleural effusions. No acute consolidative airspace disease. Numerous (greater than 20) solid pulmonary nodules scattered throughout both lungs involving all lung lobes, all stable in size since 06/25/2023 chest CT with representative 1.6 x 1.4 cm medial right middle lobe nodule (series 4/image 101), 0.7 cm anterior left lower lobe nodule (series 4/image 110) and 0.7 cm left upper lobe nodule (series 4/image 64). No new significant pulmonary nodules. Upper abdomen: Numerous simple upper renal cysts bilaterally, largest 5.9 cm on the right, for which no follow-up imaging is recommended. Musculoskeletal: No aggressive appearing focal osseous  lesions. Moderate thoracic spondylosis. IMPRESSION: 1. Bilateral pulmonary metastases are all stable from 06/25/2023 chest CT. 2. No thoracic adenopathy. No new or progressive metastatic disease in the chest. 3. Trace bilateral posterior pleural effusions. 4. Two-vessel coronary atherosclerosis. 5. Dilated main pulmonary artery, suggesting pulmonary arterial hypertension. 6.  Aortic Atherosclerosis (ICD10-I70.0). Electronically Signed   By: Selinda DELENA Blue M.D.   On: 09/24/2023 19:15   DG ABD ACUTE 2+V W 1V CHEST Result Date: 09/19/2023 CLINICAL DATA:  Small bowel obstruction. EXAM: DG ABDOMEN ACUTE WITH 1 VIEW CHEST COMPARISON:  September 18, 2023. FINDINGS: Right internal jugular Port-A-Cath is noted. Right basilar atelectasis or infiltrate is noted. Probable small pleural effusions are noted. Nasogastric tube tip seen in proximal stomach. No abnormal bowel dilatation is noted. Postsurgical changes are noted in right upper quadrant. IMPRESSION: Right basilar atelectasis or infiltrate with probable small pleural effusions. Nasogastric tube tip seen in proximal stomach. No abnormal bowel dilatation. Electronically Signed   By: Lynwood Landy Raddle M.D.   On: 09/19/2023 11:20   DG Abd 1 View Result Date: 09/18/2023 CLINICAL DATA:  77 year old female with abdominal distension. Small-bowel obstruction. EXAM: ABDOMEN - 1 VIEW COMPARISON:  09/16/2023 radiograph and CT Abdomen and Pelvis. FINDINGS: Enteric tube remains in place, side hole at the level of the gastric body. Paucity of bowel gas similar to the CT scout view 2 days ago. Stable abdominal wall surgical clips or sutures. Stable lung bases. Stable visualized osseous structures. Vascular calcifications. IMPRESSION: 1.  Stable and satisfactory enteric tube placement. 2. Persistent paucity of bowel gas similar to the CT scout view. Residual small-bowel obstruction difficult to exclude. Electronically Signed   By: VEAR Hurst M.D.   On: 09/18/2023 08:37   DG Abd 1  View Result Date: 09/16/2023 CLINICAL DATA:  X-ray for NG tube placement EXAM: ABDOMEN - 1 VIEW limited for tube placement COMPARISON:  CT earlier 09/16/2023 FINDINGS: Limited x-ray of the upper abdomen demonstrates enteric tube with tip overlying the midbody of the stomach. Surgical changes in the midabdomen. Overlapping cardiac leads. IMPRESSION: Limited x-ray for tube placement has a enteric tube with tip overlying the midbody of the stomach. Electronically Signed   By: Ranell Bring M.D.   On: 09/16/2023 18:09   CT ABDOMEN PELVIS W CONTRAST Result Date: 09/16/2023 CLINICAL DATA:  Abdominal pain and nausea EXAM: CT ABDOMEN AND PELVIS WITH CONTRAST TECHNIQUE: Multidetector CT imaging of the abdomen and pelvis was performed using the standard protocol following bolus administration of intravenous contrast. RADIATION DOSE REDUCTION: This exam was performed according to the departmental dose-optimization program which includes automated exposure control, adjustment of the mA and/or kV according to patient size and/or use of iterative reconstruction technique. CONTRAST:  80mL OMNIPAQUE  IOHEXOL  300 MG/ML  SOLN COMPARISON:  06/20/2023 FINDINGS: Lower chest: 1.8 x 1.4 cm right middle lobe nodule (series 4, image 16), not significantly changed in size. Additional scattered subcentimeter bibasilar nodules are also unchanged. Mild bibasilar subsegmental atelectasis. Circumferential thickening of the distal esophagus. Hepatobiliary: Stable 8 mm subcapsular lesion in the anterior inferior right hepatic lobe (series 2, image 37). No new focal liver abnormality. Prior cholecystectomy with similar degree of biliary dilatation. Pancreas: Mild ductal dilatation without focal lesion or inflammatory changes. Spleen: Stable splenic cyst.  No splenomegaly. Adrenals/Urinary Tract: Unremarkable adrenal glands. Multiple bilateral renal cysts which require no follow-up imaging. No renal stone or hydronephrosis. Urinary bladder within  normal limits. Stomach/Bowel: Prior colon resection with left anterior abdominal wall colostomy. Adjacent parastomal hernia. Small volume fluid within the hernia sac. Stomach is distended. Multiple dilated loops of small bowel throughout the abdomen with transition point in the anterior aspect of the lower abdomen, left of midline (series 2, image 54). Normal appendix in the right lower quadrant. Vascular/Lymphatic: Aortoiliac atherosclerosis. Enlarged right external iliac chain lymph node measuring 1.4 cm (series 2, image 68), previously 1.3 cm. Several mildly prominent retroperitoneal lymph nodes have increased in size from prior but remain subcentimeter in size. Reproductive: Status post hysterectomy. No adnexal masses. Other: Small volume ascites.  No pneumoperitoneum. Musculoskeletal: Permeative metastatic lesion the left iliac wing not appreciably changed. No pathologic fracture. No new bony lesion identified. IMPRESSION: 1. Small-bowel obstruction with transition point in the anterior aspect of the lower abdomen, left of midline. This may be secondary to adhesions. 2. Small volume ascites. 3. Prior colon resection with left anterior abdominal wall colostomy. Adjacent parastomal hernia. Small volume fluid within the hernia sac. 4. Stable pulmonary metastatic disease. 5. Stable metastatic lesion of the left iliac wing. 6. Enlarged right external iliac chain lymph node measuring 1.4 cm, previously 1.3 cm. Several mildly prominent retroperitoneal lymph nodes have increased in size from prior but remain subcentimeter in size. 7. Aortic atherosclerosis (ICD10-I70.0). Electronically Signed   By: Mabel Converse D.O.   On: 09/16/2023 14:07

## 2023-10-16 NOTE — Assessment & Plan Note (Signed)
 continue lomotil  QID PRN as instructed.

## 2023-10-16 NOTE — Assessment & Plan Note (Signed)
 History of PE and bilateral lower extremity DVT  05/18/23 Repeat right LE US showed chronic non occlusive DVT Continue  Eliquis 2.5 mg twice daily for anticoagulation prophylaxis. Recommend leg elevation and compression stocking.

## 2023-10-16 NOTE — Assessment & Plan Note (Signed)
 Hb stable, observation

## 2023-10-17 LAB — CEA: CEA: 6825 ng/mL — ABNORMAL HIGH (ref 0.0–4.7)

## 2023-10-18 ENCOUNTER — Inpatient Hospital Stay: Payer: 59 | Attending: Oncology

## 2023-10-18 VITALS — BP 151/65

## 2023-10-18 DIAGNOSIS — E876 Hypokalemia: Secondary | ICD-10-CM | POA: Diagnosis not present

## 2023-10-18 DIAGNOSIS — Z86711 Personal history of pulmonary embolism: Secondary | ICD-10-CM | POA: Diagnosis not present

## 2023-10-18 DIAGNOSIS — R634 Abnormal weight loss: Secondary | ICD-10-CM | POA: Diagnosis not present

## 2023-10-18 DIAGNOSIS — R197 Diarrhea, unspecified: Secondary | ICD-10-CM | POA: Insufficient documentation

## 2023-10-18 DIAGNOSIS — C7951 Secondary malignant neoplasm of bone: Secondary | ICD-10-CM | POA: Insufficient documentation

## 2023-10-18 DIAGNOSIS — K435 Parastomal hernia without obstruction or  gangrene: Secondary | ICD-10-CM | POA: Insufficient documentation

## 2023-10-18 DIAGNOSIS — Z87891 Personal history of nicotine dependence: Secondary | ICD-10-CM | POA: Insufficient documentation

## 2023-10-18 DIAGNOSIS — I10 Essential (primary) hypertension: Secondary | ICD-10-CM | POA: Diagnosis not present

## 2023-10-18 DIAGNOSIS — G629 Polyneuropathy, unspecified: Secondary | ICD-10-CM | POA: Insufficient documentation

## 2023-10-18 DIAGNOSIS — E7800 Pure hypercholesterolemia, unspecified: Secondary | ICD-10-CM | POA: Diagnosis not present

## 2023-10-18 DIAGNOSIS — Z5111 Encounter for antineoplastic chemotherapy: Secondary | ICD-10-CM | POA: Insufficient documentation

## 2023-10-18 DIAGNOSIS — C7802 Secondary malignant neoplasm of left lung: Secondary | ICD-10-CM | POA: Diagnosis not present

## 2023-10-18 DIAGNOSIS — J90 Pleural effusion, not elsewhere classified: Secondary | ICD-10-CM | POA: Diagnosis not present

## 2023-10-18 DIAGNOSIS — K219 Gastro-esophageal reflux disease without esophagitis: Secondary | ICD-10-CM | POA: Diagnosis not present

## 2023-10-18 DIAGNOSIS — Z7901 Long term (current) use of anticoagulants: Secondary | ICD-10-CM | POA: Insufficient documentation

## 2023-10-18 DIAGNOSIS — R188 Other ascites: Secondary | ICD-10-CM | POA: Insufficient documentation

## 2023-10-18 DIAGNOSIS — Z86718 Personal history of other venous thrombosis and embolism: Secondary | ICD-10-CM | POA: Diagnosis not present

## 2023-10-18 DIAGNOSIS — I2510 Atherosclerotic heart disease of native coronary artery without angina pectoris: Secondary | ICD-10-CM | POA: Diagnosis not present

## 2023-10-18 DIAGNOSIS — Z79899 Other long term (current) drug therapy: Secondary | ICD-10-CM | POA: Insufficient documentation

## 2023-10-18 DIAGNOSIS — C799 Secondary malignant neoplasm of unspecified site: Secondary | ICD-10-CM

## 2023-10-18 DIAGNOSIS — I700 Atherosclerosis of aorta: Secondary | ICD-10-CM | POA: Insufficient documentation

## 2023-10-18 DIAGNOSIS — C20 Malignant neoplasm of rectum: Secondary | ICD-10-CM | POA: Insufficient documentation

## 2023-10-18 DIAGNOSIS — C7801 Secondary malignant neoplasm of right lung: Secondary | ICD-10-CM | POA: Insufficient documentation

## 2023-10-18 DIAGNOSIS — D6481 Anemia due to antineoplastic chemotherapy: Secondary | ICD-10-CM | POA: Diagnosis not present

## 2023-10-18 DIAGNOSIS — Z933 Colostomy status: Secondary | ICD-10-CM | POA: Diagnosis not present

## 2023-10-18 DIAGNOSIS — Z800 Family history of malignant neoplasm of digestive organs: Secondary | ICD-10-CM | POA: Insufficient documentation

## 2023-10-18 MED ORDER — HEPARIN SOD (PORK) LOCK FLUSH 100 UNIT/ML IV SOLN
500.0000 [IU] | Freq: Once | INTRAVENOUS | Status: AC | PRN
  Administered 2023-10-18: 500 [IU]
  Filled 2023-10-18: qty 5

## 2023-10-18 MED ORDER — SODIUM CHLORIDE 0.9% FLUSH
10.0000 mL | INTRAVENOUS | Status: DC | PRN
  Administered 2023-10-18: 10 mL
  Filled 2023-10-18: qty 10

## 2023-10-19 ENCOUNTER — Inpatient Hospital Stay: Payer: 59

## 2023-10-23 ENCOUNTER — Encounter: Payer: Self-pay | Admitting: Oncology

## 2023-10-23 DIAGNOSIS — B372 Candidiasis of skin and nail: Secondary | ICD-10-CM | POA: Diagnosis not present

## 2023-10-23 DIAGNOSIS — I10 Essential (primary) hypertension: Secondary | ICD-10-CM | POA: Diagnosis not present

## 2023-10-23 DIAGNOSIS — Z556 Problems related to health literacy: Secondary | ICD-10-CM | POA: Diagnosis not present

## 2023-10-23 DIAGNOSIS — Z7901 Long term (current) use of anticoagulants: Secondary | ICD-10-CM | POA: Diagnosis not present

## 2023-10-23 DIAGNOSIS — Z87891 Personal history of nicotine dependence: Secondary | ICD-10-CM | POA: Diagnosis not present

## 2023-10-23 DIAGNOSIS — Z79899 Other long term (current) drug therapy: Secondary | ICD-10-CM | POA: Diagnosis not present

## 2023-10-29 MED ORDER — FLUOROURACIL CHEMO INJECTION 5 GM/100ML
2400.0000 mg/m2 | INTRAVENOUS | Status: DC
  Administered 2023-10-30: 4350 mg via INTRAVENOUS
  Filled 2023-10-29: qty 87

## 2023-10-30 ENCOUNTER — Telehealth: Payer: Self-pay

## 2023-10-30 ENCOUNTER — Encounter: Payer: Self-pay | Admitting: Oncology

## 2023-10-30 ENCOUNTER — Inpatient Hospital Stay: Payer: 59

## 2023-10-30 ENCOUNTER — Inpatient Hospital Stay (HOSPITAL_BASED_OUTPATIENT_CLINIC_OR_DEPARTMENT_OTHER): Payer: 59 | Admitting: Oncology

## 2023-10-30 VITALS — BP 134/74 | HR 91 | Temp 96.7°F | Resp 18 | Wt 167.3 lb

## 2023-10-30 DIAGNOSIS — J90 Pleural effusion, not elsewhere classified: Secondary | ICD-10-CM | POA: Diagnosis not present

## 2023-10-30 DIAGNOSIS — Z5111 Encounter for antineoplastic chemotherapy: Secondary | ICD-10-CM

## 2023-10-30 DIAGNOSIS — C20 Malignant neoplasm of rectum: Secondary | ICD-10-CM

## 2023-10-30 DIAGNOSIS — K435 Parastomal hernia without obstruction or  gangrene: Secondary | ICD-10-CM | POA: Diagnosis not present

## 2023-10-30 DIAGNOSIS — I87009 Postthrombotic syndrome without complications of unspecified extremity: Secondary | ICD-10-CM | POA: Diagnosis not present

## 2023-10-30 DIAGNOSIS — D6481 Anemia due to antineoplastic chemotherapy: Secondary | ICD-10-CM

## 2023-10-30 DIAGNOSIS — R197 Diarrhea, unspecified: Secondary | ICD-10-CM | POA: Diagnosis not present

## 2023-10-30 DIAGNOSIS — C7802 Secondary malignant neoplasm of left lung: Secondary | ICD-10-CM | POA: Diagnosis not present

## 2023-10-30 DIAGNOSIS — R188 Other ascites: Secondary | ICD-10-CM | POA: Diagnosis not present

## 2023-10-30 DIAGNOSIS — Z933 Colostomy status: Secondary | ICD-10-CM | POA: Diagnosis not present

## 2023-10-30 DIAGNOSIS — I10 Essential (primary) hypertension: Secondary | ICD-10-CM | POA: Diagnosis not present

## 2023-10-30 DIAGNOSIS — Z86711 Personal history of pulmonary embolism: Secondary | ICD-10-CM

## 2023-10-30 DIAGNOSIS — T451X5A Adverse effect of antineoplastic and immunosuppressive drugs, initial encounter: Secondary | ICD-10-CM

## 2023-10-30 DIAGNOSIS — C7951 Secondary malignant neoplasm of bone: Secondary | ICD-10-CM | POA: Diagnosis not present

## 2023-10-30 DIAGNOSIS — Z79899 Other long term (current) drug therapy: Secondary | ICD-10-CM | POA: Diagnosis not present

## 2023-10-30 DIAGNOSIS — E7800 Pure hypercholesterolemia, unspecified: Secondary | ICD-10-CM | POA: Diagnosis not present

## 2023-10-30 DIAGNOSIS — Z7901 Long term (current) use of anticoagulants: Secondary | ICD-10-CM | POA: Diagnosis not present

## 2023-10-30 DIAGNOSIS — C799 Secondary malignant neoplasm of unspecified site: Secondary | ICD-10-CM

## 2023-10-30 DIAGNOSIS — K521 Toxic gastroenteritis and colitis: Secondary | ICD-10-CM | POA: Diagnosis not present

## 2023-10-30 DIAGNOSIS — E876 Hypokalemia: Secondary | ICD-10-CM | POA: Diagnosis not present

## 2023-10-30 DIAGNOSIS — G629 Polyneuropathy, unspecified: Secondary | ICD-10-CM | POA: Diagnosis not present

## 2023-10-30 DIAGNOSIS — C7801 Secondary malignant neoplasm of right lung: Secondary | ICD-10-CM | POA: Diagnosis not present

## 2023-10-30 DIAGNOSIS — R634 Abnormal weight loss: Secondary | ICD-10-CM | POA: Diagnosis not present

## 2023-10-30 DIAGNOSIS — I700 Atherosclerosis of aorta: Secondary | ICD-10-CM | POA: Diagnosis not present

## 2023-10-30 DIAGNOSIS — I2510 Atherosclerotic heart disease of native coronary artery without angina pectoris: Secondary | ICD-10-CM | POA: Diagnosis not present

## 2023-10-30 DIAGNOSIS — K219 Gastro-esophageal reflux disease without esophagitis: Secondary | ICD-10-CM | POA: Diagnosis not present

## 2023-10-30 DIAGNOSIS — Z86718 Personal history of other venous thrombosis and embolism: Secondary | ICD-10-CM | POA: Diagnosis not present

## 2023-10-30 LAB — CBC WITH DIFFERENTIAL (CANCER CENTER ONLY)
Abs Immature Granulocytes: 0.01 10*3/uL (ref 0.00–0.07)
Basophils Absolute: 0 10*3/uL (ref 0.0–0.1)
Basophils Relative: 0 %
Eosinophils Absolute: 0.1 10*3/uL (ref 0.0–0.5)
Eosinophils Relative: 3 %
HCT: 32.6 % — ABNORMAL LOW (ref 36.0–46.0)
Hemoglobin: 10.2 g/dL — ABNORMAL LOW (ref 12.0–15.0)
Immature Granulocytes: 0 %
Lymphocytes Relative: 32 %
Lymphs Abs: 1.2 10*3/uL (ref 0.7–4.0)
MCH: 29.1 pg (ref 26.0–34.0)
MCHC: 31.3 g/dL (ref 30.0–36.0)
MCV: 92.9 fL (ref 80.0–100.0)
Monocytes Absolute: 0.4 10*3/uL (ref 0.1–1.0)
Monocytes Relative: 12 %
Neutro Abs: 1.9 10*3/uL (ref 1.7–7.7)
Neutrophils Relative %: 53 %
Platelet Count: 198 10*3/uL (ref 150–400)
RBC: 3.51 MIL/uL — ABNORMAL LOW (ref 3.87–5.11)
RDW: 17.5 % — ABNORMAL HIGH (ref 11.5–15.5)
WBC Count: 3.7 10*3/uL — ABNORMAL LOW (ref 4.0–10.5)
nRBC: 0 % (ref 0.0–0.2)

## 2023-10-30 LAB — CMP (CANCER CENTER ONLY)
ALT: 18 U/L (ref 0–44)
AST: 43 U/L — ABNORMAL HIGH (ref 15–41)
Albumin: 3.4 g/dL — ABNORMAL LOW (ref 3.5–5.0)
Alkaline Phosphatase: 82 U/L (ref 38–126)
Anion gap: 8 (ref 5–15)
BUN: 12 mg/dL (ref 8–23)
CO2: 26 mmol/L (ref 22–32)
Calcium: 9 mg/dL (ref 8.9–10.3)
Chloride: 104 mmol/L (ref 98–111)
Creatinine: 0.56 mg/dL (ref 0.44–1.00)
GFR, Estimated: >60 mL/min (ref >60)
Glucose, Bld: 105 mg/dL — ABNORMAL HIGH (ref 70–99)
Potassium: 4.1 mmol/L (ref 3.5–5.1)
Sodium: 138 mmol/L (ref 135–145)
Total Bilirubin: 0.4 mg/dL (ref 0.0–1.2)
Total Protein: 6.5 g/dL (ref 6.5–8.1)

## 2023-10-30 LAB — MAGNESIUM: Magnesium: 1.9 mg/dL (ref 1.7–2.4)

## 2023-10-30 MED ORDER — OXALIPLATIN CHEMO INJECTION 100 MG/20ML
70.0000 mg/m2 | Freq: Once | INTRAVENOUS | Status: AC
  Administered 2023-10-30: 125 mg via INTRAVENOUS
  Filled 2023-10-30: qty 25

## 2023-10-30 MED ORDER — DEXTROSE 5 % IV SOLN
Freq: Once | INTRAVENOUS | Status: AC
  Filled 2023-10-30: qty 250

## 2023-10-30 MED ORDER — DIPHENHYDRAMINE HCL 25 MG PO CAPS
25.0000 mg | ORAL_CAPSULE | Freq: Once | ORAL | Status: AC
  Administered 2023-10-30: 25 mg via ORAL
  Filled 2023-10-30: qty 1

## 2023-10-30 MED ORDER — DEXTROSE 5 % IV SOLN
400.0000 mg/m2 | Freq: Once | INTRAVENOUS | Status: AC
  Administered 2023-10-30: 724 mg via INTRAVENOUS
  Filled 2023-10-30: qty 36.2

## 2023-10-30 MED ORDER — PALONOSETRON HCL INJECTION 0.25 MG/5ML
0.2500 mg | Freq: Once | INTRAVENOUS | Status: AC
Start: 2023-10-30 — End: 2023-10-30
  Administered 2023-10-30: 0.25 mg via INTRAVENOUS
  Filled 2023-10-30: qty 5

## 2023-10-30 MED ORDER — DEXAMETHASONE SODIUM PHOSPHATE 10 MG/ML IJ SOLN
10.0000 mg | Freq: Once | INTRAMUSCULAR | Status: AC
  Administered 2023-10-30: 10 mg via INTRAVENOUS
  Filled 2023-10-30: qty 1

## 2023-10-30 MED ORDER — ACETAMINOPHEN 325 MG PO TABS
650.0000 mg | ORAL_TABLET | Freq: Once | ORAL | Status: AC
  Administered 2023-10-30: 650 mg via ORAL
  Filled 2023-10-30: qty 2

## 2023-10-30 NOTE — Telephone Encounter (Signed)
 Referral faxed to [facility]. Fax confirmation received.   Re: Leg swelling, history of DVT.   Fax: [phone]

## 2023-10-30 NOTE — Assessment & Plan Note (Signed)
 History of PE and bilateral lower extremity DVT  05/18/23 Repeat right LE US showed chronic non occlusive DVT Continue  Eliquis 2.5 mg twice daily for anticoagulation prophylaxis. Recommend leg elevation and compression stocking.

## 2023-10-30 NOTE — Assessment & Plan Note (Signed)
 Refer to vascular surgeon per patient's request.  Leg elevation and compression stocking.

## 2023-10-30 NOTE — Progress Notes (Signed)
 Hematology/Oncology Progress note [phone] [phone]      CHIEF COMPLAINTS/REASON FOR VISIT:  Follow up for rectal cancer  ASSESSMENT & PLAN:   Cancer Staging  Rectal cancer [facility]) Staging form: Colon and Rectum, AJCC 8th Edition - Pathologic stage from 10/06/2019: Stage IIC (ypT4b, pN0, cM0) - Signed by Babara Call, MD on 10/06/2019 - Clinical stage from 06/30/2020: [name] - Signed by Babara Call, MD on 04/17/2022 - Pathologic: Stage Unknown (rpTX, pNX, cM1) - Signed by Babara Call, MD on 01/31/2021   Rectal cancer [facility]) History of stage IIIC Rectal cancer, s/p TNT, followed by 09/17/19 APR/posterior vaginectomy/TAH/BSO/VY-flap, pT4b pN0 with close vaginal margin 0.2 mm.  Uterus and ovaries negative for malignancy. palliative radiation to vaginal recurrence- 01/19/21 recurrence with lung metastasis.-Palliative -FOLFIRI plus bevacizumab .  Irinotecan  was dropped in November 2022 due to side effects. Negative for UGT1A1*28 - radiographically stable, rise of CEA-July 2023 CT lung metastasis worse--> Dec 2023 PET showed progression in pelvic lymph nodes and bone lesions,--> 2nd line irinotecan  +panitumumab -->CT March 2024 stable. --> June 2024 CT showed mild lung progression/Progressive left iliac bone metastasis, vulvar mass difficult for biopsy in office,  Tempus Liquid biopsy - no actionable variants --> 06/25/2023 CT showed progression of lung nodules [SLD increase 26mm] and iliac inguinal lymphadenopathy--> 07/23/23 switch to dose reduced FOLFOX --> Dec 2024 CT- stable disease.   Labs are reviewed and discussed with patient,  CEA nadir was in 490, progressively increases - 6000sShe has tolerated FOLFOX well- grade 1 neuropathy Recommend continue dose reduced FOLFOX .    Anemia due to antineoplastic chemotherapy Hb stable, observation  Chemotherapy induced diarrhea continue lomotil   QID PRN as instructed.  She has not needed to take antidiarrhea.   Encounter for  antineoplastic chemotherapy Chemotherapy plan as listed above.   History of pulmonary embolism History of PE and bilateral lower extremity DVT  05/18/23 Repeat right LE US  showed chronic non occlusive DVT Continue  Eliquis  2.5 mg twice daily for anticoagulation prophylaxis. Recommend leg elevation and compression stocking.   Postthrombotic syndrome of lower extremity Refer to vascular surgeon per patient's request.  Leg elevation and compression stocking.    Follow up 2 weeks.  All questions were answered. The patient knows to call the clinic with any problems, questions or concerns.  Call Babara, MD, PhD [facility] Hematology Oncology 10/30/2023      HISTORY OF PRESENTING ILLNESS:  Oncology History  Rectal cancer (HCC)  01/23/2019 Initial Diagnosis   Rectal cancer [facility]) Patient initially presented with complaints of postmenopausal bleeding on 08/16/2018.  History of was menopausal vaginal bleeding in 2016 which resulted in cervical polypectomy.  Pathology 02/04/2015 showed cervical polyp, consistent with benign endometrial polyp.  [name] after polypectomy due to anxiety associated with pelvic exams.  pelvic exam on 08/16/2018 reviewed cervical abnormality and from enlarged uterus. Seen by Dr. Connell on 10/29/2018.  Endometrial biopsy and a Pap smear was performed. 10/29/2018 Pap smear showed adenocarcinoma, favor endometrial origin. 10/29/2018 endometrial biopsy showed endometrioid carcinoma, FIGO grade 1.  10/29/2018- TA & TV Ultrasound revealed: Anteverted uterus measuring 8.7 x 5.6 x 6.4 cm without evidence of focal masses.  The endometrium measuring 24.1 mm (thickened) and heterogeneous.  Right and left ovaries not visualized.  No adnexal masses identified.  No free fluid in cul-de-sac.  Patient was seen by Dr. Elby in clinic on 11/13/2018.  Cervical exam reveals 2 cm exophytic irregular mass consistent with malignancy.    11/19/2018 CT chest abdomen  pelvis with contrast  showed thickened endometrium with some irregularity compatible with the provided diagnosis of endometrial malignancy.  There is a mildly prominent left inguinal node 1.4 cm.  Patient was seen by Dr. Mancil on 11/20/2018 and left groin lymph node biopsy was recommended.  11/26/2018 patient underwent left inguinal lymph node biopsy. Pathology showed metastatic adenocarcinoma consistent with colorectal origin.  CDX 2+.  Case was discussed on tumor board.  Recommend colonoscopy for further evaluation.  Patient reports significant weight loss 30 pounds over the last year.  Chronic vaginal spotting. Change of bowel habits the past few months.  More constipated.  Family history positive for brother who has colon cancer prostate cancer.  patient has underwent colonoscopy on 12/03/2018 which reviewed a nonobstructing large mass in the rectum.  Also chronic fistula.  Mass was not circumferential.  This was biopsied with a cold forceps for histology.  Pathology came back hyperplastic polyp negative for dysplasia and malignancy. [name], patient underwent flex sigmoidoscopy on 12/06/2018 with rebiopsy of the rectal mass. This time biopsy results came back positive for invasive colorectal adenocarcinoma, moderately differentiated. Immunotherapy for nearly mismatch repair protein (MMR ) was performed.  There is no loss of MMR expression.  low probability of MSI high.   # Seen by [name] for evaluation of resectability for rectal cancer. In addition, she also had a second opinion with [facility] pathology where her endometrial biopsy pathology was changed to  adenocarcinoma, consistent with colorectal primary.   [name]. She has home health that has been assisting with ostomy care  Patient was also evaluated by [facility] oncology.  Recommendation is to proceed with TNT with concurrent chemoradiation followed by neoadjuvant chemotherapy followed by surgical  resection. Patient prefers to have treatment done locally with [facility].   # Oncology Treatment:  02/03/2019- 03/19/2019  concurrent Xeloda  and radiation.  Xeloda  dose 825mg  /m2 BID - rounded to 1650mg  BID- on days of radiation. 04/09/2019, started on FOLFOX with bolus early.  Omitted.  07/16/2019 finished 8 cycles of FOLFOX. 09/17/19 APR/posterior vaginectomy/TAH/BSO/VY-flap pT4b pN0 with close vaginal margin 0.2 mm.  Uterus and ovaries negative for malignancy.   10/06/2019 Cancer Staging   Staging form: Colon and Rectum, AJCC 8th Edition - Pathologic stage from 10/06/2019: Stage IIC (ypT4b, pN0, cM0) - Signed by Babara Call, MD on 10/06/2019   06/30/2020 Cancer Staging   Staging form: Colon and Rectum, AJCC 8th Edition - Clinical stage from 06/30/2020: [name] - Signed by Babara Call, MD on 04/17/2022 Stage prefix: Recurrence Total positive nodes: 1   06/30/2020 Relapse/Recurrence   vaginal introitus mass biopsied. Pathology is consistent with metastatic colorectal adenocarcinoma    07/20/2020 - 09/13/2020 Chemotherapy   Concurrent chemotherapy with Xeloda  with Radiation.  08/02/2020-08/16/2020, . Xeloda  was held due to neutropenia    08/17/2020 - 09/06/2020 Radiation Therapy   Xeloda  1500 mg twice daily concurrently with radiation    01/31/2021 Cancer Staging   Staging form: Colon and Rectum, AJCC 8th Edition - Pathologic: Stage Unknown (rpTX, pNX, cM1) - Signed by Babara Call, MD on 01/31/2021 Stage prefix: Recurrence   01/31/2021 - 08/20/2021 Chemotherapy   FOLFIRI + Bev  Irinotecan  dose was reduced, eventually 100mg /m2  Irinotecan  was dropped in November 2022 due to side effects. Negative for UGT1A1*28    09/13/2021 -  Chemotherapy   maintenance 5-FU/bevacizumab     11/28/2021 -  Chemotherapy   5-FU/Irinotecan /bevacizumab . Irinotecan  100 mg/m2 was added back due to  progressively increasing CEA.    07/14/2022 Imaging   Bone scan  1. No evidence of skeletal metastatic disease in the  hips. 2. Asymmetric increased activity in the RIGHT humeral head is favored degenerative as described above. If pain in the RIGHT shoulder consider further evaluation with plain film or MRI   07/17/2022 Imaging   1. Stable scattered small bilateral pulmonary nodules, largest 8 by 6 mm in the right middle lobe. 2. Subtle chronically stable posterior cortical irregularity and heterogeneity in the left iliac bone, not appreciable on the bone scan of 07/14/2022. This has been present at least over the past year and could reflect radiation therapy related findings although strictly speaking, malignant involvement of the left iliac bone is difficult to exclude. If clinically warranted this could be further assessed with nuclear medicine PET-CT or MRI of the bony pelvis with and without contrast. 3. Pelvic floor laxity with loops of small bowel extending 3 cm below the pubococcygeal line. 4. Aortic and systemic atherosclerosis as detailed above. Aortic Atherosclerosis    12/26/2022 Imaging   CT chest abdomen pelvis w contrast 1. Stable scattered solid pulmonary nodules. 2. Right inguinal and right pelvic lymph nodes which were hypermetabolic on prior PET-CT are stable. 3. Osseous lesion of the proximal right humerus demonstrates increased sclerosis when compared with the prior PET-CT, possibly due to treatment response. 4. 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 5. Aortic Atherosclerosis     06/25/2023 Imaging   CT chest abdomen pelvis w contrast showed 1. Increased size of the solid bilateral pulmonary nodules,consistent with worsening metastatic disease. 2. Increased size of the right external iliac and inguinal lymph nodes, concerning for worsening metastatic disease. 3. No significant interval change in the left iliac bone osseous metastasis. 4. Enlarged main pulmonary artery measuring 3.6 cm, which can be seen in the setting of pulmonary arterial hypertension. 5.  Aortic Atherosclerosis  (ICD10-I70.0)     Miscellaneous   Tempus liquid biopsy showed no actionable variants.  NOTCH1 missense variant, BRCA2 missense variant , MYC missense variant   07/11/2023 Imaging   PET showed 1. Multifocal tracer avid abdominopelvic lymph nodes are identified compatible with metastatic disease. 2. Multifocal bilateral pulmonary nodules are identified compatible with pulmonary metastasis.  3. Diffuse increased radiotracer uptake throughout the marrow space is identified diminishing sensitivity for detecting osseous metastasis. No abnormal increased radiotracer uptake identified above background increased bone marrow activity to indicate metabolically active bone metastases. 4.  Aortic Atherosclerosis   07/23/2023 -  Chemotherapy   switch to dose reduced FOLFOX    09/16/2023 Imaging   CT abdomen pelvis w contrast  1. Small-bowel obstruction with transition point in the anterior aspect of the lower abdomen, left of midline. This may be secondary to adhesions. 2. Small volume ascites. 3. Prior colon resection with left anterior abdominal wall colostomy. Adjacent parastomal hernia. Small volume fluid within the hernia sac. 4. Stable pulmonary metastatic disease. 5. Stable metastatic lesion of the left iliac wing. 6. Enlarged right external iliac chain lymph node measuring 1.4 cm, previously 1.3 cm. Several mildly prominent retroperitoneal lymph nodes have increased in size from prior but remain subcentimeter in size. 7. Aortic atherosclerosis (ICD10-I70.0).   09/16/2023 - 09/20/2023 Hospital Admission   Hospitalized due to Small bowel obstruction due to adhesions    09/24/2023 Imaging   1. Bilateral pulmonary metastases are all stable from 06/25/2023 chest CT. 2. No thoracic adenopathy. No new or progressive metastatic disease in the chest. 3. Trace bilateral posterior  pleural effusions. 4. Two-vessel coronary atherosclerosis. 5. Dilated main pulmonary artery, suggesting pulmonary  arterial hypertension. 6.  Aortic Atherosclerosis (ICD10-I70.0).   Metastatic adenocarcinoma [facility])       # Oncology Treatment:  02/03/2019- 03/19/2019  concurrent Xeloda  and radiation.  Xeloda  dose 825mg  /m2 BID - rounded to 1650mg  BID- on days of radiation. 04/09/2019, started on FOLFOX with bolus early.  Omitted.  07/16/2019 finished 8 cycles of FOLFOX. 09/17/19 APR/posterior vaginectomy/TAH/BSO/VY-flap pT4b pN0 with close vaginal margin 0.2 mm.  Uterus and ovaries negative for malignancy. Patient reports bilateral lower extremity numbness and tingling, intermittent, left worse than right. She has lost a lot of weight since her APR surgery.   #Family history with half brother having's history of colon cancer prostate cancer.  Personal history of colorectal cancer.  Patient has not decided if she wants genetic testing.    # history of PE( 01/13/2020)  in the bilateral lower extremity DVT (01/13/2020).   She finishes 6 months of anticoagulation with Eliquis  5 mg twice daily. Now switched to Eliquis  2.5 mg twice daily..  # She has now developed recurrent disease. #06/30/20  vaginal introitus mass biopsied. Pathology is consistent with metastatic colorectal adenocarcinoma I have discussed with [name]  Dr. Florie and the mass is not resectable. Patient has also had colonoscopy by Dr. Therisa yesterday. Normal examination. # 07/16/2020 cycle 1 FOLFIRI  # 07/20/2020 PET scan was done for further evaluation, images are consistent with local recurrence, no distant metastasis. #Discussed with radiation oncology Dr. Lenn will recommends concurrent chemotherapy and radiation. 08/02/2020-08/16/2020, patient starts radiation.  Xeloda  was held due to neutropenia 08/17/2020,-09/06/2020 Xeloda  1500 mg twice daily concurrently with radiation  01/31/21 started on FOLFIRI + Bev 05/18/2021 CT chest abdomen pelvis showed Previously noted enlargement of bilateral inguinal lymph nodes is resolved, consistent with  treatment response of nodal metastatic disease. Interval decrease in size of multiple small bilateral pulmonary nodules, consistent with treatment response of pulmonary metastatic disease. No evidence of new metastatic disease. 05/24/2021 - 08/30/2021, continued on FOLFIRI plus bevacizumab .  Irinotecan  dose was reduced, eventually 100mg /m2  09/02/2021, CT chest abdomen pelvis without contrast Showed small bilateral pulmonary nodules, unchanged.  Stable metastatic disease.  No noncontrast evidence of new metastatic disease in the chest abdomen pelvis.  Small parastomal hernia.  Enlargement of main pulmonary artery.  Coronary artery disease.  09/13/2021, maintenance 5-FU/bevacizumab  11/28/2021, 5-FU/Irinotecan /bevacizumab .  Irinotecan  100 mg/m2 was added back due to progressively increasing CEA.  -right lower extremity edema, US  showed non occlusive DVT, edema has improved.   INTERVAL HISTORY Jacqueline Yoder is a 78 y.o. female who has above history reviewed by me presents for follow-up of rectal cancer. On Irinotecan  and panitumumab . she tolerates well.   She was accompanied by her daughter. + she reports manageable neuropathy symptoms of her fingertips, intermittent,  worse when exposed to cold temperature.  + chronic loose ostomy output, stable, not worse.  + right lower extremity edema, worst at the end of the day, better in AM. She wears compression stocking.   Review of Systems  Constitutional:  Positive for fatigue. Negative for appetite change, chills, fever and unexpected weight change.  HENT:   Negative for hearing loss and voice change.   Eyes:  Negative for eye problems.  Respiratory:  Negative for chest tightness and cough.   Cardiovascular:  Negative for chest pain.  Gastrointestinal:  Positive for diarrhea. Negative for abdominal distention, abdominal pain, blood in stool, constipation and nausea.  Endocrine: Negative for hot  flashes.  Genitourinary:  Negative for difficulty  urinating and frequency.   Musculoskeletal:  Positive for arthralgias.  Skin:  Negative for itching.  Neurological:  Positive for numbness. Negative for extremity weakness.  Hematological:  Negative for adenopathy.  Psychiatric/Behavioral:  Negative for confusion.     MEDICAL HISTORY:  Past Medical History:  Diagnosis Date   Allergy    Arthritis    Blood clot in vein    Family history of colon cancer    GERD (gastroesophageal reflux disease)    Hypercholesteremia    Hypertension    Hypertension    Lower extremity edema    Personal history of chemotherapy    Rectal cancer (HCC) 12/2018   Urinary incontinence     SURGICAL HISTORY: Past Surgical History:  Procedure Laterality Date   ABDOMINAL HYSTERECTOMY     CHOLECYSTECTOMY  1971   COLONOSCOPY WITH PROPOFOL  N/A 12/03/2018   Procedure: COLONOSCOPY WITH PROPOFOL ;  Surgeon: Jinny Carmine, MD;  Location: [facility] ENDOSCOPY;  Service: Endoscopy;  Laterality: N/A;   COLONOSCOPY WITH PROPOFOL  N/A 07/15/2020   Procedure: COLONOSCOPY WITH PROPOFOL ;  Surgeon: Therisa Bi, MD;  Location: [facility] ENDOSCOPY;  Service: Gastroenterology;  Laterality: N/A;   FLEXIBLE SIGMOIDOSCOPY N/A 12/06/2018   Procedure: FLEXIBLE SIGMOIDOSCOPY;  Surgeon: Therisa Bi, MD;  Location: [facility] ENDOSCOPY;  Service: Endoscopy;  Laterality: N/A;   LAPAROSCOPIC COLOSTOMY  01/06/2019   PORTACATH PLACEMENT N/A 04/03/2019   Procedure: INSERTION PORT-A-CATH;  Surgeon: Jordis Laneta FALCON, MD;  Location: [facility] ORS;  Service: General;  Laterality: N/A;    SOCIAL HISTORY: Social History   Socioeconomic History   Marital status: Widowed    Spouse name: Not on file   Number of children: Not on file   Years of education: Not on file   Highest education level: Some college, no degree  Occupational History   Not on file  Tobacco Use   Smoking status: Former    Current packs/day: 0.00    Types: Cigarettes    Quit date: 12/02/1977    Years since quitting: 45.9   Smokeless tobacco:  Former  Building Services Engineer status: Never Used  Substance and Sexual Activity   Alcohol use: Never   Drug use: Never   Sexual activity: Not Currently    Birth control/protection: None  Other Topics Concern   Not on file  Social History Narrative   Lives with daughter   Social Drivers of Health   Financial Resource Strain: Low Risk  (09/26/2023)   Overall Financial Resource Strain (CARDIA)    Difficulty of Paying Living Expenses: Not very hard  Food Insecurity: No Food Insecurity (09/27/2023)   Hunger Vital Sign    Worried About Running Out of Food in the Last Year: Never true    Ran Out of Food in the Last Year: Never true  Transportation Needs: No Transportation Needs (09/27/2023)   PRAPARE - Administrator, Civil Service (Medical): No    Lack of Transportation (Non-Medical): No  Physical Activity: Insufficiently Active (09/26/2023)   Exercise Vital Sign    Days of Exercise per Week: 2 days    Minutes of Exercise per Session: 10 min  Stress: No Stress Concern Present (09/26/2023)   [employer] of Occupational Health - Occupational Stress Questionnaire    Feeling of Stress : Not at all  Social Connections: Moderately Integrated (09/27/2023)   Social Connection and Isolation Panel [NHANES]    Frequency of Communication with Friends and Family: More than three times  a week    Frequency of Social Gatherings with Friends and Family: More than three times a week    Attends Religious Services: More than 4 times per year    Active Member of Clubs or Organizations: Yes    Attends Banker Meetings: More than 4 times per year    Marital Status: Widowed  Recent Concern: Social Connections - Moderately Isolated (07/06/2023)   Social Connection and Isolation Panel [NHANES]    Frequency of Communication with Friends and Family: More than three times a week    Frequency of Social Gatherings with Friends and Family: More than three times a week    Attends  Religious Services: More than 4 times per year    Active Member of [employer] or Organizations: No    Attends Banker Meetings: Never    Marital Status: Widowed  Intimate Partner Violence: Not At Risk (09/27/2023)   Humiliation, Afraid, Rape, and Kick questionnaire    Fear of Current or Ex-Partner: No    Emotionally Abused: No    Physically Abused: No    Sexually Abused: No    FAMILY HISTORY: Family History  Problem Relation Age of Onset   Colon cancer Brother 51       exposure to chemicals [place]   Hypertension Mother    Stroke Mother    Kidney failure Father    Breast cancer Neg Hx    Ovarian cancer Neg Hx     ALLERGIES:  is allergic to sulfamethoxazole-trimethoprim.  MEDICATIONS:  Current Outpatient Medications  Medication Sig Dispense Refill   acetaminophen  (TYLENOL ) 650 MG CR tablet Take 650 mg by mouth every 8 (eight) hours as needed for pain.     Cholecalciferol  (VITAMIN D3) 2000 units capsule Take 2,000 Units by mouth daily.     clindamycin  (CLINDAGEL) 1 % gel Apply topically 2 (two) times daily. 30 g 5   diclofenac  sodium (VOLTAREN ) 1 % GEL Apply 2 g topically 4 (four) times daily as needed (joint pain).  11   diphenoxylate -atropine  (LOMOTIL ) 2.5-0.025 MG tablet Take 1 tablet by mouth 4 (four) times daily as needed for diarrhea or loose stools. 120 tablet 0   ELIQUIS  2.5 MG TABS tablet TAKE 1 TABLET BY MOUTH TWICE  DAILY 200 tablet 2   fluticasone  (FLONASE ) 50 MCG/ACT nasal spray Place 1 spray into both nostrils daily as needed for allergies or rhinitis.     gentamicin  cream (GARAMYCIN ) 0.1 % Apply to affected toe once daily. 30 g 1   ipratropium (ATROVENT ) 0.03 % nasal spray USE 1 SPRAY IN BOTH NOSTRILS  TWICE DAILY (Patient taking differently: Place 1 spray into both nostrils 2 (two) times daily as needed for rhinitis.) 60 mL 2   ketoconazole  (NIZORAL ) 2 % cream Apply 1 Application topically daily. For up to 2 weeks. May repeat if need. 30 g 2    lidocaine -prilocaine  (EMLA ) cream Apply 1 application. topically as needed. 30 g 6   loratadine  (CLARITIN ) 10 MG tablet Take 10 mg by mouth daily.     magnesium  chloride (SLOW-MAG) 64 MG TBEC SR tablet Take 1 tablet (64 mg total) by mouth 2 (two) times daily. 60 tablet 2   Multiple Vitamins-Minerals (ONE-A-DAY WOMENS 50 PLUS PO) Take 1 tablet by mouth daily.      nystatin  (MYCOSTATIN /NYSTOP ) powder APPLY 1 POWDER TOPICALLY THREE TIMES DAILY 45 g 0   potassium chloride  SA (KLOR-CON  M) 20 MEQ tablet TAKE 1 TABLET BY MOUTH DAILY 100 tablet 2  simvastatin  (ZOCOR ) 40 MG tablet Take 1 tablet (40 mg total) by mouth at bedtime. 100 tablet 3   triamcinolone  cream (KENALOG ) 0.5 % Apply 1 Application topically 2 (two) times daily. To affected areas, for up to 2 weeks. 30 g 2   zinc  gluconate 50 MG tablet Take 50 mg by mouth daily.     No current facility-administered medications for this visit.   Facility-Administered Medications Ordered in Other Visits  Medication Dose Route Frequency Provider Last Rate Last Admin   fluorouracil  (ADRUCIL ) 4,350 mg in sodium chloride  0.9 % 63 mL chemo infusion  2,400 mg/m2 (Treatment Plan Recorded) Intravenous 1 day or 1 dose Babara Call, MD       heparin  lock flush 100 UNIT/ML injection            leucovorin  724 mg in dextrose  5 % 250 mL infusion  400 mg/m2 (Treatment Plan Recorded) Intravenous Once Jaydy Fitzhenry, MD       oxaliplatin  (ELOXATIN ) 125 mg in dextrose  5 % 500 mL chemo infusion  70 mg/m2 (Treatment Plan Recorded) Intravenous Once Babara Call, MD         PHYSICAL EXAMINATION: ECOG PERFORMANCE STATUS: 1 - Symptomatic but completely ambulatory  Physical Exam Constitutional:      General: She is not in acute distress. HENT:     Head: Normocephalic and atraumatic.  Eyes:     General: No scleral icterus. Cardiovascular:     Rate and Rhythm: Normal rate.     Heart sounds: Normal heart sounds.  Pulmonary:     Effort: Pulmonary effort is normal. No respiratory  distress.     Breath sounds: No wheezing.  Abdominal:     General: Bowel sounds are normal. There is no distension.     Palpations: Abdomen is soft.     Comments: + Colostomy bag   Musculoskeletal:        General: No deformity. Normal range of motion.     Cervical back: Normal range of motion and neck supple.     Right lower leg: Edema present.  Skin:    General: Skin is warm and dry.  Neurological:     Mental Status: She is alert and oriented to person, place, and time. Mental status is at baseline.     Cranial Nerves: No cranial nerve deficit.     Coordination: Coordination normal.       LABORATORY DATA:  I have reviewed the data as listed    Latest Ref Rng & Units 10/30/2023    7:50 AM 10/16/2023    9:11 AM 09/20/2023    4:26 AM  CBC  WBC 4.0 - 10.5 K/uL 3.7  5.3  3.3   Hemoglobin 12.0 - 15.0 g/dL 89.7  89.4  89.6   Hematocrit 36.0 - 46.0 % 32.6  33.2  31.0   Platelets 150 - 400 K/uL 198  209  176       Latest Ref Rng & Units 10/30/2023    7:50 AM 10/16/2023    9:11 AM 09/20/2023    4:26 AM  CMP  Glucose 70 - 99 mg/dL 894  886  95   BUN 8 - 23 mg/dL 12  15  9    Creatinine 0.44 - 1.00 mg/dL 9.43  9.24  9.18   Sodium 135 - 145 mmol/L 138  137  139   Potassium 3.5 - 5.1 mmol/L 4.1  4.0  3.9   Chloride 98 - 111 mmol/L 104  104  106  CO2 22 - 32 mmol/L 26  25  26    Calcium  8.9 - 10.3 mg/dL 9.0  9.0  8.2   Total Protein 6.5 - 8.1 g/dL 6.5  6.6    Total Bilirubin 0.0 - 1.2 mg/dL 0.4  0.6    Alkaline Phos 38 - 126 U/L 82  54    AST 15 - 41 U/L 43  50    ALT 0 - 44 U/L 18  25       RADIOGRAPHIC STUDIES: I have personally reviewed the radiological images as listed and agreed with the findings in the report. CT Chest W Contrast Result Date: 09/24/2023 CLINICAL DATA:  Metastatic rectal cancer on palliative chemotherapy. Restaging. * Tracking Code: BO * EXAM: CT CHEST WITH CONTRAST TECHNIQUE: Multidetector CT imaging of the chest was performed during intravenous contrast  administration. RADIATION DOSE REDUCTION: This exam was performed according to the departmental dose-optimization program which includes automated exposure control, adjustment of the mA and/or kV according to patient size and/or use of iterative reconstruction technique. CONTRAST:  75mL OMNIPAQUE  IOHEXOL  300 MG/ML  SOLN COMPARISON:  06/25/2023 chest CT.  07/09/2023 PET-CT. FINDINGS: Cardiovascular: Normal heart size. No significant pericardial effusion/thickening. Left anterior descending and right coronary atherosclerosis. Right internal jugular Port-A-Cath terminates at the cavoatrial junction. Atherosclerotic nonaneurysmal thoracic aorta. Dilated main pulmonary artery (3.5 cm diameter). No central pulmonary emboli. Mediastinum/Nodes: No significant thyroid  nodules. Unremarkable esophagus. No pathologically enlarged axillary, mediastinal or hilar lymph nodes. Lungs/Pleura: No pneumothorax. Trace bilateral posterior pleural effusions. No acute consolidative airspace disease. Numerous (greater than 20) solid pulmonary nodules scattered throughout both lungs involving all lung lobes, all stable in size since 06/25/2023 chest CT with representative 1.6 x 1.4 cm medial right middle lobe nodule (series 4/image 101), 0.7 cm anterior left lower lobe nodule (series 4/image 110) and 0.7 cm left upper lobe nodule (series 4/image 64). No new significant pulmonary nodules. Upper abdomen: Numerous simple upper renal cysts bilaterally, largest 5.9 cm on the right, for which no follow-up imaging is recommended. Musculoskeletal: No aggressive appearing focal osseous lesions. Moderate thoracic spondylosis. IMPRESSION: 1. Bilateral pulmonary metastases are all stable from 06/25/2023 chest CT. 2. No thoracic adenopathy. No new or progressive metastatic disease in the chest. 3. Trace bilateral posterior pleural effusions. 4. Two-vessel coronary atherosclerosis. 5. Dilated main pulmonary artery, suggesting pulmonary arterial  hypertension. 6.  Aortic Atherosclerosis (ICD10-I70.0). Electronically Signed   By: Selinda DELENA Blue M.D.   On: 09/24/2023 19:15   DG ABD ACUTE 2+V W 1V CHEST Result Date: 09/19/2023 CLINICAL DATA:  Small bowel obstruction. EXAM: DG ABDOMEN ACUTE WITH 1 VIEW CHEST COMPARISON:  September 18, 2023. FINDINGS: Right internal jugular Port-A-Cath is noted. Right basilar atelectasis or infiltrate is noted. Probable small pleural effusions are noted. Nasogastric tube tip seen in proximal stomach. No abnormal bowel dilatation is noted. Postsurgical changes are noted in right upper quadrant. IMPRESSION: Right basilar atelectasis or infiltrate with probable small pleural effusions. Nasogastric tube tip seen in proximal stomach. No abnormal bowel dilatation. Electronically Signed   By: Lynwood Landy Raddle M.D.   On: 09/19/2023 11:20   DG Abd 1 View Result Date: 09/18/2023 CLINICAL DATA:  78 year old female with abdominal distension. Small-bowel obstruction. EXAM: ABDOMEN - 1 VIEW COMPARISON:  09/16/2023 radiograph and CT Abdomen and Pelvis. FINDINGS: Enteric tube remains in place, side hole at the level of the gastric body. Paucity of bowel gas similar to the CT scout view 2 days ago. Stable abdominal wall surgical clips or sutures. Stable  lung bases. Stable visualized osseous structures. Vascular calcifications. IMPRESSION: 1. Stable and satisfactory enteric tube placement. 2. Persistent paucity of bowel gas similar to the CT scout view. Residual small-bowel obstruction difficult to exclude. Electronically Signed   By: VEAR Hurst M.D.   On: 09/18/2023 08:37   DG Abd 1 View Result Date: 09/16/2023 CLINICAL DATA:  X-ray for NG tube placement EXAM: ABDOMEN - 1 VIEW limited for tube placement COMPARISON:  CT earlier 09/16/2023 FINDINGS: Limited x-ray of the upper abdomen demonstrates enteric tube with tip overlying the midbody of the stomach. Surgical changes in the midabdomen. Overlapping cardiac leads. IMPRESSION: Limited x-ray for  tube placement has a enteric tube with tip overlying the midbody of the stomach. Electronically Signed   By: Ranell Bring M.D.   On: 09/16/2023 18:09   CT ABDOMEN PELVIS W CONTRAST Result Date: 09/16/2023 CLINICAL DATA:  Abdominal pain and nausea EXAM: CT ABDOMEN AND PELVIS WITH CONTRAST TECHNIQUE: Multidetector CT imaging of the abdomen and pelvis was performed using the standard protocol following bolus administration of intravenous contrast. RADIATION DOSE REDUCTION: This exam was performed according to the departmental dose-optimization program which includes automated exposure control, adjustment of the mA and/or kV according to patient size and/or use of iterative reconstruction technique. CONTRAST:  80mL OMNIPAQUE  IOHEXOL  300 MG/ML  SOLN COMPARISON:  06/20/2023 FINDINGS: Lower chest: 1.8 x 1.4 cm right middle lobe nodule (series 4, image 16), not significantly changed in size. Additional scattered subcentimeter bibasilar nodules are also unchanged. Mild bibasilar subsegmental atelectasis. Circumferential thickening of the distal esophagus. Hepatobiliary: Stable 8 mm subcapsular lesion in the anterior inferior right hepatic lobe (series 2, image 37). No new focal liver abnormality. Prior cholecystectomy with similar degree of biliary dilatation. Pancreas: Mild ductal dilatation without focal lesion or inflammatory changes. Spleen: Stable splenic cyst.  No splenomegaly. Adrenals/Urinary Tract: Unremarkable adrenal glands. Multiple bilateral renal cysts which require no follow-up imaging. No renal stone or hydronephrosis. Urinary bladder within normal limits. Stomach/Bowel: Prior colon resection with left anterior abdominal wall colostomy. Adjacent parastomal hernia. Small volume fluid within the hernia sac. Stomach is distended. Multiple dilated loops of small bowel throughout the abdomen with transition point in the anterior aspect of the lower abdomen, left of midline (series 2, image 54). Normal appendix  in the right lower quadrant. Vascular/Lymphatic: Aortoiliac atherosclerosis. Enlarged right external iliac chain lymph node measuring 1.4 cm (series 2, image 68), previously 1.3 cm. Several mildly prominent retroperitoneal lymph nodes have increased in size from prior but remain subcentimeter in size. Reproductive: Status post hysterectomy. No adnexal masses. Other: Small volume ascites.  No pneumoperitoneum. Musculoskeletal: Permeative metastatic lesion the left iliac wing not appreciably changed. No pathologic fracture. No new bony lesion identified. IMPRESSION: 1. Small-bowel obstruction with transition point in the anterior aspect of the lower abdomen, left of midline. This may be secondary to adhesions. 2. Small volume ascites. 3. Prior colon resection with left anterior abdominal wall colostomy. Adjacent parastomal hernia. Small volume fluid within the hernia sac. 4. Stable pulmonary metastatic disease. 5. Stable metastatic lesion of the left iliac wing. 6. Enlarged right external iliac chain lymph node measuring 1.4 cm, previously 1.3 cm. Several mildly prominent retroperitoneal lymph nodes have increased in size from prior but remain subcentimeter in size. 7. Aortic atherosclerosis (ICD10-I70.0). Electronically Signed   By: Mabel Converse D.O.   On: 09/16/2023 14:07      CT Chest W Contrast Result Date: 09/24/2023 CLINICAL DATA:  Metastatic rectal cancer on palliative chemotherapy. Restaging. *  Tracking Code: BO * EXAM: CT CHEST WITH CONTRAST TECHNIQUE: Multidetector CT imaging of the chest was performed during intravenous contrast administration. RADIATION DOSE REDUCTION: This exam was performed according to the departmental dose-optimization program which includes automated exposure control, adjustment of the mA and/or kV according to patient size and/or use of iterative reconstruction technique. CONTRAST:  75mL OMNIPAQUE  IOHEXOL  300 MG/ML  SOLN COMPARISON:  06/25/2023 chest CT.  07/09/2023 PET-CT.  FINDINGS: Cardiovascular: Normal heart size. No significant pericardial effusion/thickening. Left anterior descending and right coronary atherosclerosis. Right internal jugular Port-A-Cath terminates at the cavoatrial junction. Atherosclerotic nonaneurysmal thoracic aorta. Dilated main pulmonary artery (3.5 cm diameter). No central pulmonary emboli. Mediastinum/Nodes: No significant thyroid  nodules. Unremarkable esophagus. No pathologically enlarged axillary, mediastinal or hilar lymph nodes. Lungs/Pleura: No pneumothorax. Trace bilateral posterior pleural effusions. No acute consolidative airspace disease. Numerous (greater than 20) solid pulmonary nodules scattered throughout both lungs involving all lung lobes, all stable in size since 06/25/2023 chest CT with representative 1.6 x 1.4 cm medial right middle lobe nodule (series 4/image 101), 0.7 cm anterior left lower lobe nodule (series 4/image 110) and 0.7 cm left upper lobe nodule (series 4/image 64). No new significant pulmonary nodules. Upper abdomen: Numerous simple upper renal cysts bilaterally, largest 5.9 cm on the right, for which no follow-up imaging is recommended. Musculoskeletal: No aggressive appearing focal osseous lesions. Moderate thoracic spondylosis. IMPRESSION: 1. Bilateral pulmonary metastases are all stable from 06/25/2023 chest CT. 2. No thoracic adenopathy. No new or progressive metastatic disease in the chest. 3. Trace bilateral posterior pleural effusions. 4. Two-vessel coronary atherosclerosis. 5. Dilated main pulmonary artery, suggesting pulmonary arterial hypertension. 6.  Aortic Atherosclerosis (ICD10-I70.0). Electronically Signed   By: Selinda DELENA Blue M.D.   On: 09/24/2023 19:15   DG ABD ACUTE 2+V W 1V CHEST Result Date: 09/19/2023 CLINICAL DATA:  Small bowel obstruction. EXAM: DG ABDOMEN ACUTE WITH 1 VIEW CHEST COMPARISON:  September 18, 2023. FINDINGS: Right internal jugular Port-A-Cath is noted. Right basilar atelectasis or  infiltrate is noted. Probable small pleural effusions are noted. Nasogastric tube tip seen in proximal stomach. No abnormal bowel dilatation is noted. Postsurgical changes are noted in right upper quadrant. IMPRESSION: Right basilar atelectasis or infiltrate with probable small pleural effusions. Nasogastric tube tip seen in proximal stomach. No abnormal bowel dilatation. Electronically Signed   By: Lynwood Landy Raddle M.D.   On: 09/19/2023 11:20   DG Abd 1 View Result Date: 09/18/2023 CLINICAL DATA:  78 year old female with abdominal distension. Small-bowel obstruction. EXAM: ABDOMEN - 1 VIEW COMPARISON:  09/16/2023 radiograph and CT Abdomen and Pelvis. FINDINGS: Enteric tube remains in place, side hole at the level of the gastric body. Paucity of bowel gas similar to the CT scout view 2 days ago. Stable abdominal wall surgical clips or sutures. Stable lung bases. Stable visualized osseous structures. Vascular calcifications. IMPRESSION: 1. Stable and satisfactory enteric tube placement. 2. Persistent paucity of bowel gas similar to the CT scout view. Residual small-bowel obstruction difficult to exclude. Electronically Signed   By: VEAR Hurst M.D.   On: 09/18/2023 08:37   DG Abd 1 View Result Date: 09/16/2023 CLINICAL DATA:  X-ray for NG tube placement EXAM: ABDOMEN - 1 VIEW limited for tube placement COMPARISON:  CT earlier 09/16/2023 FINDINGS: Limited x-ray of the upper abdomen demonstrates enteric tube with tip overlying the midbody of the stomach. Surgical changes in the midabdomen. Overlapping cardiac leads. IMPRESSION: Limited x-ray for tube placement has a enteric tube with tip overlying the midbody of the  stomach. Electronically Signed   By: Ranell Bring M.D.   On: 09/16/2023 18:09   CT ABDOMEN PELVIS W CONTRAST Result Date: 09/16/2023 CLINICAL DATA:  Abdominal pain and nausea EXAM: CT ABDOMEN AND PELVIS WITH CONTRAST TECHNIQUE: Multidetector CT imaging of the abdomen and pelvis was performed using the  standard protocol following bolus administration of intravenous contrast. RADIATION DOSE REDUCTION: This exam was performed according to the departmental dose-optimization program which includes automated exposure control, adjustment of the mA and/or kV according to patient size and/or use of iterative reconstruction technique. CONTRAST:  80mL OMNIPAQUE  IOHEXOL  300 MG/ML  SOLN COMPARISON:  06/20/2023 FINDINGS: Lower chest: 1.8 x 1.4 cm right middle lobe nodule (series 4, image 16), not significantly changed in size. Additional scattered subcentimeter bibasilar nodules are also unchanged. Mild bibasilar subsegmental atelectasis. Circumferential thickening of the distal esophagus. Hepatobiliary: Stable 8 mm subcapsular lesion in the anterior inferior right hepatic lobe (series 2, image 37). No new focal liver abnormality. Prior cholecystectomy with similar degree of biliary dilatation. Pancreas: Mild ductal dilatation without focal lesion or inflammatory changes. Spleen: Stable splenic cyst.  No splenomegaly. Adrenals/Urinary Tract: Unremarkable adrenal glands. Multiple bilateral renal cysts which require no follow-up imaging. No renal stone or hydronephrosis. Urinary bladder within normal limits. Stomach/Bowel: Prior colon resection with left anterior abdominal wall colostomy. Adjacent parastomal hernia. Small volume fluid within the hernia sac. Stomach is distended. Multiple dilated loops of small bowel throughout the abdomen with transition point in the anterior aspect of the lower abdomen, left of midline (series 2, image 54). Normal appendix in the right lower quadrant. Vascular/Lymphatic: Aortoiliac atherosclerosis. Enlarged right external iliac chain lymph node measuring 1.4 cm (series 2, image 68), previously 1.3 cm. Several mildly prominent retroperitoneal lymph nodes have increased in size from prior but remain subcentimeter in size. Reproductive: Status post hysterectomy. No adnexal masses. Other: Small  volume ascites.  No pneumoperitoneum. Musculoskeletal: Permeative metastatic lesion the left iliac wing not appreciably changed. No pathologic fracture. No new bony lesion identified. IMPRESSION: 1. Small-bowel obstruction with transition point in the anterior aspect of the lower abdomen, left of midline. This may be secondary to adhesions. 2. Small volume ascites. 3. Prior colon resection with left anterior abdominal wall colostomy. Adjacent parastomal hernia. Small volume fluid within the hernia sac. 4. Stable pulmonary metastatic disease. 5. Stable metastatic lesion of the left iliac wing. 6. Enlarged right external iliac chain lymph node measuring 1.4 cm, previously 1.3 cm. Several mildly prominent retroperitoneal lymph nodes have increased in size from prior but remain subcentimeter in size. 7. Aortic atherosclerosis (ICD10-I70.0). Electronically Signed   By: Mabel Converse D.O.   On: 09/16/2023 14:07

## 2023-10-30 NOTE — Assessment & Plan Note (Addendum)
 continue lomotil  QID PRN as instructed.  She has not needed to take antidiarrhea.

## 2023-10-30 NOTE — Assessment & Plan Note (Addendum)
 History of stage IIIC Rectal cancer, s/p TNT, followed by 09/17/19 APR/posterior vaginectomy/TAH/BSO/VY-flap, pT4b pN0 with close vaginal margin 0.2 mm.  Uterus and ovaries negative for malignancy. palliative radiation to vaginal recurrence- 01/19/21 recurrence with lung metastasis.-Palliative -FOLFIRI plus bevacizumab .  Irinotecan  was dropped in November 2022 due to side effects. Negative for UGT1A1*28 - radiographically stable, rise of CEA-July 2023 CT lung metastasis worse--> Dec 2023 PET showed progression in pelvic lymph nodes and bone lesions,--> 2nd line irinotecan  +panitumumab -->CT March 2024 stable. --> June 2024 CT showed mild lung progression/Progressive left iliac bone metastasis, vulvar mass difficult for biopsy in office,  Tempus Liquid biopsy - no actionable variants --> 06/25/2023 CT showed progression of lung nodules [SLD increase 23mm] and iliac inguinal lymphadenopathy--> 07/23/23 switch to dose reduced FOLFOX --> Dec 2024 CT- stable disease.   Labs are reviewed and discussed with patient,  CEA nadir was in 490, progressively increases - 6000sShe has tolerated FOLFOX well- grade 1 neuropathy Recommend continue dose reduced FOLFOX .

## 2023-10-30 NOTE — Assessment & Plan Note (Signed)
 Hb stable, observation

## 2023-10-30 NOTE — Patient Instructions (Signed)
 [facility] - A DEPT OF [facility]  Discharge Instructions: Thank you for choosing [facility] to provide your oncology and hematology care.  If you have a lab appointment with the Cancer Center, please go directly to the Cancer Center and check in at the registration area.  Wear comfortable clothing and clothing appropriate for easy access to any Portacath or PICC line.   We strive to give you quality time with your provider. You may need to reschedule your appointment if you arrive late (15 or more minutes).  Arriving late affects you and other patients whose appointments are after yours.  Also, if you miss three or more appointments without notifying the office, you may be dismissed from the clinic at the provider's discretion.      For prescription refill requests, have your pharmacy contact our office and allow 72 hours for refills to be completed.     To help prevent nausea and vomiting after your treatment, we encourage you to take your nausea medication as directed.  BELOW ARE SYMPTOMS THAT SHOULD BE REPORTED IMMEDIATELY: *FEVER GREATER THAN 100.4 F (38 C) OR HIGHER *CHILLS OR SWEATING *NAUSEA AND VOMITING THAT IS NOT CONTROLLED WITH YOUR NAUSEA MEDICATION *UNUSUAL SHORTNESS OF BREATH *UNUSUAL BRUISING OR BLEEDING *URINARY PROBLEMS (pain or burning when urinating, or frequent urination) *BOWEL PROBLEMS (unusual diarrhea, constipation, pain near the anus) TENDERNESS IN MOUTH AND THROAT WITH OR WITHOUT PRESENCE OF ULCERS (sore throat, sores in mouth, or a toothache) UNUSUAL RASH, SWELLING OR PAIN  UNUSUAL VAGINAL DISCHARGE OR ITCHING   Items with * indicate a potential emergency and should be followed up as soon as possible or go to the Emergency Department if any problems should occur.  Please show the CHEMOTHERAPY ALERT CARD or IMMUNOTHERAPY ALERT CARD at check-in to the Emergency Department and triage nurse.  Should you have  questions after your visit or need to cancel or reschedule your appointment, please contact [facility] - A DEPT OF [facility]  [phone] and follow the prompts.  Office hours are 8:00 a.m. to 4:30 p.m. Monday - Friday. Please note that voicemails left after 4:00 p.m. may not be returned until the following business day.  We are closed weekends and major holidays. You have access to a nurse at all times for urgent questions. Please call the main number to the clinic [phone] and follow the prompts.  For any non-urgent questions, you may also contact your provider using MyChart. We now offer e-Visits for anyone 68 and older to request care online for non-urgent symptoms. For details visit [email or web address].   Also download the MyChart app! Go to the app store, search "MyChart", open the app, select [place], and log in with your MyChart username and password.

## 2023-10-30 NOTE — Assessment & Plan Note (Signed)
 Chemotherapy plan as listed above

## 2023-10-31 DIAGNOSIS — I10 Essential (primary) hypertension: Secondary | ICD-10-CM | POA: Diagnosis not present

## 2023-10-31 DIAGNOSIS — Z87891 Personal history of nicotine dependence: Secondary | ICD-10-CM | POA: Diagnosis not present

## 2023-10-31 DIAGNOSIS — Z556 Problems related to health literacy: Secondary | ICD-10-CM | POA: Diagnosis not present

## 2023-10-31 DIAGNOSIS — Z7901 Long term (current) use of anticoagulants: Secondary | ICD-10-CM | POA: Diagnosis not present

## 2023-10-31 DIAGNOSIS — B372 Candidiasis of skin and nail: Secondary | ICD-10-CM | POA: Diagnosis not present

## 2023-10-31 DIAGNOSIS — Z79899 Other long term (current) drug therapy: Secondary | ICD-10-CM | POA: Diagnosis not present

## 2023-10-31 LAB — CEA: CEA: 7574 ng/mL — ABNORMAL HIGH (ref 0.0–4.7)

## 2023-11-01 ENCOUNTER — Inpatient Hospital Stay: Payer: 59

## 2023-11-01 DIAGNOSIS — C7801 Secondary malignant neoplasm of right lung: Secondary | ICD-10-CM | POA: Diagnosis not present

## 2023-11-01 DIAGNOSIS — C7802 Secondary malignant neoplasm of left lung: Secondary | ICD-10-CM | POA: Diagnosis not present

## 2023-11-01 DIAGNOSIS — E7800 Pure hypercholesterolemia, unspecified: Secondary | ICD-10-CM | POA: Diagnosis not present

## 2023-11-01 DIAGNOSIS — I2510 Atherosclerotic heart disease of native coronary artery without angina pectoris: Secondary | ICD-10-CM | POA: Diagnosis not present

## 2023-11-01 DIAGNOSIS — K435 Parastomal hernia without obstruction or  gangrene: Secondary | ICD-10-CM | POA: Diagnosis not present

## 2023-11-01 DIAGNOSIS — E876 Hypokalemia: Secondary | ICD-10-CM | POA: Diagnosis not present

## 2023-11-01 DIAGNOSIS — Z86718 Personal history of other venous thrombosis and embolism: Secondary | ICD-10-CM | POA: Diagnosis not present

## 2023-11-01 DIAGNOSIS — C799 Secondary malignant neoplasm of unspecified site: Secondary | ICD-10-CM

## 2023-11-01 DIAGNOSIS — R634 Abnormal weight loss: Secondary | ICD-10-CM | POA: Diagnosis not present

## 2023-11-01 DIAGNOSIS — C20 Malignant neoplasm of rectum: Secondary | ICD-10-CM | POA: Diagnosis not present

## 2023-11-01 DIAGNOSIS — I700 Atherosclerosis of aorta: Secondary | ICD-10-CM | POA: Diagnosis not present

## 2023-11-01 DIAGNOSIS — R188 Other ascites: Secondary | ICD-10-CM | POA: Diagnosis not present

## 2023-11-01 DIAGNOSIS — G629 Polyneuropathy, unspecified: Secondary | ICD-10-CM | POA: Diagnosis not present

## 2023-11-01 DIAGNOSIS — D6481 Anemia due to antineoplastic chemotherapy: Secondary | ICD-10-CM | POA: Diagnosis not present

## 2023-11-01 DIAGNOSIS — I10 Essential (primary) hypertension: Secondary | ICD-10-CM | POA: Diagnosis not present

## 2023-11-01 DIAGNOSIS — K219 Gastro-esophageal reflux disease without esophagitis: Secondary | ICD-10-CM | POA: Diagnosis not present

## 2023-11-01 DIAGNOSIS — R197 Diarrhea, unspecified: Secondary | ICD-10-CM | POA: Diagnosis not present

## 2023-11-01 DIAGNOSIS — C7951 Secondary malignant neoplasm of bone: Secondary | ICD-10-CM | POA: Diagnosis not present

## 2023-11-01 DIAGNOSIS — Z5111 Encounter for antineoplastic chemotherapy: Secondary | ICD-10-CM | POA: Diagnosis not present

## 2023-11-01 DIAGNOSIS — Z79899 Other long term (current) drug therapy: Secondary | ICD-10-CM | POA: Diagnosis not present

## 2023-11-01 DIAGNOSIS — Z7901 Long term (current) use of anticoagulants: Secondary | ICD-10-CM | POA: Diagnosis not present

## 2023-11-01 DIAGNOSIS — Z86711 Personal history of pulmonary embolism: Secondary | ICD-10-CM | POA: Diagnosis not present

## 2023-11-01 DIAGNOSIS — Z933 Colostomy status: Secondary | ICD-10-CM | POA: Diagnosis not present

## 2023-11-01 DIAGNOSIS — J90 Pleural effusion, not elsewhere classified: Secondary | ICD-10-CM | POA: Diagnosis not present

## 2023-11-01 MED ORDER — HEPARIN SOD (PORK) LOCK FLUSH 100 UNIT/ML IV SOLN
500.0000 [IU] | Freq: Once | INTRAVENOUS | Status: AC | PRN
  Administered 2023-11-01: 500 [IU]
  Filled 2023-11-01: qty 5

## 2023-11-01 MED ORDER — SODIUM CHLORIDE 0.9% FLUSH
10.0000 mL | INTRAVENOUS | Status: DC | PRN
  Administered 2023-11-01: 10 mL
  Filled 2023-11-01: qty 10

## 2023-11-06 ENCOUNTER — Encounter: Payer: Self-pay | Admitting: Oncology

## 2023-11-06 ENCOUNTER — Other Ambulatory Visit: Payer: Self-pay | Admitting: Oncology

## 2023-11-06 DIAGNOSIS — C799 Secondary malignant neoplasm of unspecified site: Secondary | ICD-10-CM

## 2023-11-06 DIAGNOSIS — C20 Malignant neoplasm of rectum: Secondary | ICD-10-CM

## 2023-11-09 ENCOUNTER — Encounter: Payer: Self-pay | Admitting: Podiatry

## 2023-11-09 ENCOUNTER — Ambulatory Visit (INDEPENDENT_AMBULATORY_CARE_PROVIDER_SITE_OTHER): Payer: 59 | Admitting: Podiatry

## 2023-11-09 VITALS — Ht 64.0 in | Wt 167.3 lb

## 2023-11-09 DIAGNOSIS — M79675 Pain in left toe(s): Secondary | ICD-10-CM

## 2023-11-09 DIAGNOSIS — B351 Tinea unguium: Secondary | ICD-10-CM | POA: Diagnosis not present

## 2023-11-09 DIAGNOSIS — G620 Drug-induced polyneuropathy: Secondary | ICD-10-CM | POA: Diagnosis not present

## 2023-11-09 DIAGNOSIS — M79674 Pain in right toe(s): Secondary | ICD-10-CM

## 2023-11-09 NOTE — Progress Notes (Signed)
  Subjective:  Patient ID: Jacqueline Yoder, female    DOB: Sep 29, 1946,  MRN: [id number]  78 y.o. female presents to clinic with  at risk foot care with h/o neuropathy secondary to chemotherapy and painful elongated mycotic toenails 1-5 bilaterally which are tender when wearing enclosed shoe gear. Pain is relieved with periodic professional debridement.   She has ingrown toenail removed by Dr. Allena Katz and operative site has healed. She is accompanied by her daughter on today's visit.  Patient is currently under care of [facility] for DVT RLE. She is on anticoagulant therapy. Chief Complaint  Patient presents with   Nail Problem    Pt is here for RFC not a diabetic PCP is Dr Althea Charon and LOV was in November.   New problem(s): None   PCP is Smitty Cords, DO.  Allergies  Allergen Reactions   Sulfamethoxazole-Trimethoprim Other (See Comments)    Also said she had chills and pressure in nose.   Review of Systems: Negative except as noted in the HPI.   Objective:  SOPHIAROSE Yoder is a pleasant 78 y.o. female WD, WN in NAD. AAO x 3.  Vascular Examination: Vascular status intact b/l with palpable pedal pulses. CFT immediate b/l. No pain with calf compression b/l. Skin temperature gradient WNL b/l. Unilateral edema right LE. Mild varicosities b/l LE.  Neurological Examination: Protective sensation diminished with 10g monofilament b/l.  Dermatological Examination: Pedal skin with normal turgor, texture and tone b/l. Toenails 1-5 b/l thick, discolored, elongated with subungual debris and pain on dorsal palpation. No hyperkeratotic lesions noted b/l. Procedure site of lateral border of right great toe noted to be completely healed with no erythema, no edema, no drainage, no purulence.   Musculoskeletal Examination: Muscle strength 5/5 to b/l LE. No pain, crepitus or joint limitation noted with ROM bilateral LE. No gross bony deformities bilaterally.  Radiographs:  None  Last A1c:      Latest Ref Rng & Units 07/04/2023    8:45 AM  Hemoglobin A1C  Hemoglobin-A1c <5.7 % of total Hgb 5.1      Assessment:   1. Pain due to onychomycosis of toenails of both feet   2. Chemotherapy-induced neuropathy (HCC)    Plan:  -Patient's family member present. All questions/concerns addressed on today's visit. -Patient has DVT RLE and under care of Oncology MD and has been referred to [facility]. She is on anticoagulant therapy. She and her family are aware of signs/symptoms of PE . -Patient to continue soft, supportive shoe gear daily. -Mycotic toenails 1-5 bilaterally were debrided in length and girth with sterile nail nippers and dremel without incident. -Patient/POA to call should there be question/concern in the interim.  Return in about 3 months (around 02/07/2024).  Freddie Breech, DPM      [place] LOCATION: [address] [place] [address]                   Office [phone]   [facility] LOCATION: [address] [place], [place] [address] Office [phone]

## 2023-11-10 DIAGNOSIS — C799 Secondary malignant neoplasm of unspecified site: Secondary | ICD-10-CM | POA: Diagnosis not present

## 2023-11-10 DIAGNOSIS — M1612 Unilateral primary osteoarthritis, left hip: Secondary | ICD-10-CM | POA: Diagnosis not present

## 2023-11-10 DIAGNOSIS — G8929 Other chronic pain: Secondary | ICD-10-CM | POA: Diagnosis not present

## 2023-11-10 DIAGNOSIS — N182 Chronic kidney disease, stage 2 (mild): Secondary | ICD-10-CM | POA: Diagnosis not present

## 2023-11-10 DIAGNOSIS — I129 Hypertensive chronic kidney disease with stage 1 through stage 4 chronic kidney disease, or unspecified chronic kidney disease: Secondary | ICD-10-CM | POA: Diagnosis not present

## 2023-11-10 DIAGNOSIS — M25552 Pain in left hip: Secondary | ICD-10-CM | POA: Diagnosis not present

## 2023-11-12 DIAGNOSIS — Z933 Colostomy status: Secondary | ICD-10-CM | POA: Diagnosis not present

## 2023-11-13 ENCOUNTER — Encounter: Payer: Self-pay | Admitting: Oncology

## 2023-11-13 ENCOUNTER — Ambulatory Visit: Payer: 59

## 2023-11-13 ENCOUNTER — Inpatient Hospital Stay: Payer: 59

## 2023-11-13 ENCOUNTER — Inpatient Hospital Stay (HOSPITAL_BASED_OUTPATIENT_CLINIC_OR_DEPARTMENT_OTHER): Payer: 59 | Admitting: Oncology

## 2023-11-13 VITALS — BP 147/64 | HR 85 | Temp 98.5°F | Resp 18 | Wt 169.0 lb

## 2023-11-13 DIAGNOSIS — G620 Drug-induced polyneuropathy: Secondary | ICD-10-CM | POA: Insufficient documentation

## 2023-11-13 DIAGNOSIS — C7801 Secondary malignant neoplasm of right lung: Secondary | ICD-10-CM | POA: Diagnosis not present

## 2023-11-13 DIAGNOSIS — R197 Diarrhea, unspecified: Secondary | ICD-10-CM | POA: Diagnosis not present

## 2023-11-13 DIAGNOSIS — E876 Hypokalemia: Secondary | ICD-10-CM

## 2023-11-13 DIAGNOSIS — K219 Gastro-esophageal reflux disease without esophagitis: Secondary | ICD-10-CM | POA: Diagnosis not present

## 2023-11-13 DIAGNOSIS — C7802 Secondary malignant neoplasm of left lung: Secondary | ICD-10-CM | POA: Diagnosis not present

## 2023-11-13 DIAGNOSIS — Z933 Colostomy status: Secondary | ICD-10-CM | POA: Diagnosis not present

## 2023-11-13 DIAGNOSIS — K521 Toxic gastroenteritis and colitis: Secondary | ICD-10-CM | POA: Diagnosis not present

## 2023-11-13 DIAGNOSIS — Z5111 Encounter for antineoplastic chemotherapy: Secondary | ICD-10-CM

## 2023-11-13 DIAGNOSIS — J90 Pleural effusion, not elsewhere classified: Secondary | ICD-10-CM | POA: Diagnosis not present

## 2023-11-13 DIAGNOSIS — I87009 Postthrombotic syndrome without complications of unspecified extremity: Secondary | ICD-10-CM | POA: Diagnosis not present

## 2023-11-13 DIAGNOSIS — Z79899 Other long term (current) drug therapy: Secondary | ICD-10-CM | POA: Diagnosis not present

## 2023-11-13 DIAGNOSIS — I700 Atherosclerosis of aorta: Secondary | ICD-10-CM | POA: Diagnosis not present

## 2023-11-13 DIAGNOSIS — D6481 Anemia due to antineoplastic chemotherapy: Secondary | ICD-10-CM

## 2023-11-13 DIAGNOSIS — Z7901 Long term (current) use of anticoagulants: Secondary | ICD-10-CM | POA: Diagnosis not present

## 2023-11-13 DIAGNOSIS — I10 Essential (primary) hypertension: Secondary | ICD-10-CM | POA: Diagnosis not present

## 2023-11-13 DIAGNOSIS — C7951 Secondary malignant neoplasm of bone: Secondary | ICD-10-CM | POA: Diagnosis not present

## 2023-11-13 DIAGNOSIS — C20 Malignant neoplasm of rectum: Secondary | ICD-10-CM | POA: Diagnosis not present

## 2023-11-13 DIAGNOSIS — T451X5A Adverse effect of antineoplastic and immunosuppressive drugs, initial encounter: Secondary | ICD-10-CM

## 2023-11-13 DIAGNOSIS — C799 Secondary malignant neoplasm of unspecified site: Secondary | ICD-10-CM

## 2023-11-13 DIAGNOSIS — R634 Abnormal weight loss: Secondary | ICD-10-CM | POA: Diagnosis not present

## 2023-11-13 DIAGNOSIS — Z86711 Personal history of pulmonary embolism: Secondary | ICD-10-CM | POA: Diagnosis not present

## 2023-11-13 DIAGNOSIS — I2510 Atherosclerotic heart disease of native coronary artery without angina pectoris: Secondary | ICD-10-CM | POA: Diagnosis not present

## 2023-11-13 DIAGNOSIS — G629 Polyneuropathy, unspecified: Secondary | ICD-10-CM | POA: Diagnosis not present

## 2023-11-13 DIAGNOSIS — R188 Other ascites: Secondary | ICD-10-CM | POA: Diagnosis not present

## 2023-11-13 DIAGNOSIS — K435 Parastomal hernia without obstruction or  gangrene: Secondary | ICD-10-CM | POA: Diagnosis not present

## 2023-11-13 DIAGNOSIS — Z86718 Personal history of other venous thrombosis and embolism: Secondary | ICD-10-CM | POA: Diagnosis not present

## 2023-11-13 DIAGNOSIS — E7800 Pure hypercholesterolemia, unspecified: Secondary | ICD-10-CM | POA: Diagnosis not present

## 2023-11-13 LAB — CBC WITH DIFFERENTIAL (CANCER CENTER ONLY)
Abs Immature Granulocytes: 0.01 10*3/uL (ref 0.00–0.07)
Basophils Absolute: 0 10*3/uL (ref 0.0–0.1)
Basophils Relative: 1 %
Eosinophils Absolute: 0.1 10*3/uL (ref 0.0–0.5)
Eosinophils Relative: 2 %
HCT: 32.9 % — ABNORMAL LOW (ref 36.0–46.0)
Hemoglobin: 10.3 g/dL — ABNORMAL LOW (ref 12.0–15.0)
Immature Granulocytes: 0 %
Lymphocytes Relative: 38 %
Lymphs Abs: 1.1 10*3/uL (ref 0.7–4.0)
MCH: 29.2 pg (ref 26.0–34.0)
MCHC: 31.3 g/dL (ref 30.0–36.0)
MCV: 93.2 fL (ref 80.0–100.0)
Monocytes Absolute: 0.5 10*3/uL (ref 0.1–1.0)
Monocytes Relative: 16 %
Neutro Abs: 1.2 10*3/uL — ABNORMAL LOW (ref 1.7–7.7)
Neutrophils Relative %: 43 %
Platelet Count: 176 10*3/uL (ref 150–400)
RBC: 3.53 MIL/uL — ABNORMAL LOW (ref 3.87–5.11)
RDW: 18.6 % — ABNORMAL HIGH (ref 11.5–15.5)
WBC Count: 2.8 10*3/uL — ABNORMAL LOW (ref 4.0–10.5)
nRBC: 0 % (ref 0.0–0.2)

## 2023-11-13 LAB — CMP (CANCER CENTER ONLY)
ALT: 15 U/L (ref 0–44)
AST: 37 U/L (ref 15–41)
Albumin: 3.6 g/dL (ref 3.5–5.0)
Alkaline Phosphatase: 72 U/L (ref 38–126)
Anion gap: 8 (ref 5–15)
BUN: 13 mg/dL (ref 8–23)
CO2: 26 mmol/L (ref 22–32)
Calcium: 9.2 mg/dL (ref 8.9–10.3)
Chloride: 105 mmol/L (ref 98–111)
Creatinine: 0.72 mg/dL (ref 0.44–1.00)
GFR, Estimated: >60 mL/min (ref >60)
Glucose, Bld: 110 mg/dL — ABNORMAL HIGH (ref 70–99)
Potassium: 3.9 mmol/L (ref 3.5–5.1)
Sodium: 139 mmol/L (ref 135–145)
Total Bilirubin: 0.6 mg/dL (ref 0.0–1.2)
Total Protein: 6.5 g/dL (ref 6.5–8.1)

## 2023-11-13 LAB — MAGNESIUM: Magnesium: 2 mg/dL (ref 1.7–2.4)

## 2023-11-13 MED ORDER — LEUCOVORIN CALCIUM INJECTION 350 MG
400.0000 mg/m2 | Freq: Once | INTRAVENOUS | Status: AC
  Administered 2023-11-13: 724 mg via INTRAVENOUS
  Filled 2023-11-13: qty 36.2

## 2023-11-13 MED ORDER — DIPHENHYDRAMINE HCL 25 MG PO CAPS
25.0000 mg | ORAL_CAPSULE | Freq: Once | ORAL | Status: AC
  Administered 2023-11-13: 25 mg via ORAL
  Filled 2023-11-13: qty 1

## 2023-11-13 MED ORDER — DEXTROSE 5 % IV SOLN
Freq: Once | INTRAVENOUS | Status: AC
  Filled 2023-11-13: qty 250

## 2023-11-13 MED ORDER — DEXAMETHASONE SODIUM PHOSPHATE 10 MG/ML IJ SOLN
10.0000 mg | Freq: Once | INTRAMUSCULAR | Status: AC
  Administered 2023-11-13: 10 mg via INTRAVENOUS
  Filled 2023-11-13: qty 1

## 2023-11-13 MED ORDER — OXALIPLATIN CHEMO INJECTION 100 MG/20ML
70.0000 mg/m2 | Freq: Once | INTRAVENOUS | Status: AC
  Administered 2023-11-13: 125 mg via INTRAVENOUS
  Filled 2023-11-13: qty 25

## 2023-11-13 MED ORDER — SODIUM CHLORIDE 0.9 % IV SOLN
2400.0000 mg/m2 | INTRAVENOUS | Status: DC
  Administered 2023-11-13: 4350 mg via INTRAVENOUS
  Filled 2023-11-13: qty 87

## 2023-11-13 MED ORDER — ACETAMINOPHEN 325 MG PO TABS
650.0000 mg | ORAL_TABLET | Freq: Once | ORAL | Status: AC
  Administered 2023-11-13: 650 mg via ORAL
  Filled 2023-11-13: qty 2

## 2023-11-13 MED ORDER — PALONOSETRON HCL INJECTION 0.25 MG/5ML
0.2500 mg | Freq: Once | INTRAVENOUS | Status: AC
  Administered 2023-11-13: 0.25 mg via INTRAVENOUS
  Filled 2023-11-13: qty 5

## 2023-11-13 NOTE — Assessment & Plan Note (Signed)
Chemotherapy plan as listed above

## 2023-11-13 NOTE — Progress Notes (Signed)
Hematology/Oncology Progress note [phone] [phone]      CHIEF COMPLAINTS/REASON FOR VISIT:  Follow up for rectal cancer  ASSESSMENT & PLAN:   Cancer Staging  Rectal cancer [facility]) Staging form: Colon and Rectum, AJCC 8th Edition - Pathologic stage from 10/06/2019: Stage IIC (ypT4b, pN0, cM0) - Signed by [name], MD on 10/06/2019 - Clinical stage from 06/30/2020: [name] - Signed by [name], MD on 04/17/2022 - Pathologic: Stage Unknown (rpTX, pNX, cM1) - Signed by [name], MD on 01/31/2021   Rectal cancer [facility]) History of stage IIIC Rectal cancer, s/p TNT, followed by 09/17/19 APR/posterior vaginectomy/TAH/BSO/VY-flap, pT4b pN0 with close vaginal margin 0.2 mm.  Uterus and ovaries negative for malignancy. palliative radiation to vaginal recurrence- 01/19/21 recurrence with lung metastasis.-Palliative -FOLFIRI plus bevacizumab.  Irinotecan was dropped in November 2022 due to side effects. Negative for UGT1A1*28 - radiographically stable, rise of CEA-July 2023 CT lung metastasis worse--> Dec 2023 PET showed progression in pelvic lymph nodes and bone lesions,--> 2nd line irinotecan +panitumumab-->CT March 2024 stable. --> June 2024 CT showed mild lung progression/Progressive left iliac bone metastasis, vulvar mass difficult for biopsy in office,  Tempus Liquid biopsy - no actionable variants --> 06/25/2023 CT showed progression of lung nodules [SLD increase 32mm] and iliac inguinal lymphadenopathy--> 07/23/23 switch to dose reduced FOLFOX --> Dec 2024 CT- stable disease.   Labs are reviewed and discussed with patient,  CEA nadir was in 490, progressively increases - 6000sShe has tolerated FOLFOX well- grade 1 neuropathy Recommend continue dose reduced FOLFOX . Repeat CT    Anemia due to antineoplastic chemotherapy Hb stable, observation  Chemotherapy induced diarrhea continue lomotil  QID PRN as instructed.  She has not needed to take antidiarrhea.   Encounter  for antineoplastic chemotherapy Chemotherapy plan as listed above.   History of pulmonary embolism History of PE and bilateral lower extremity DVT  05/18/23 Repeat right LE US showed chronic non occlusive DVT Continue  Eliquis 2.5 mg twice daily for anticoagulation prophylaxis. Recommend leg elevation and compression stocking.   Hypokalemia due to loss of potassium Continue potassium chloride 20 mEq daily.    Hypomagnesemia If magnesium is 1.0 - 1.2:  administrate 6 gm IV magnesium sulfate If magnesium is 1.2-1.4, administrate 4 gm IV magnesium sulfate If magnesium is 1.5-1.6:  administrate 2 gm IV magnesium sulfate.  Continue slow mag orally 64mg  twice daily.   Postthrombotic syndrome of lower extremity Refer to vascular surgeon per patient's request.  Leg elevation and compression stocking.   Chemotherapy-induced neuropathy (HCC) Stable symptoms, Grade 1-2.patient copes well.    Follow up 2 weeks.  All questions were answered. The patient knows to call the clinic with any problems, questions or concerns.  [name], MD, PhD [facility] Hematology Oncology 11/13/2023      HISTORY OF PRESENTING ILLNESS:  Oncology History  Rectal cancer (HCC)  01/23/2019 Initial Diagnosis   Rectal cancer [facility]) Patient initially presented with complaints of postmenopausal bleeding on 08/16/2018.  History of was menopausal vaginal bleeding in 2016 which resulted in cervical polypectomy.  Pathology 02/04/2015 showed cervical polyp, consistent with benign endometrial polyp.  [name] after polypectomy due to anxiety associated with pelvic exams.  pelvic exam on 08/16/2018 reviewed cervical abnormality and from enlarged uterus. Seen by Dr. [name] on 10/29/2018.  Endometrial biopsy and a Pap smear was performed. 10/29/2018 Pap smear showed adenocarcinoma, favor endometrial origin. 10/29/2018 endometrial biopsy showed endometrioid carcinoma, FIGO grade 1.  10/29/2018- TA & TV  Ultrasound  revealed: Anteverted uterus measuring 8.7 x 5.6 x 6.4 cm without evidence of focal masses.  The endometrium measuring 24.1 mm (thickened) and heterogeneous.  Right and left ovaries not visualized.  No adnexal masses identified.  No free fluid in cul-de-sac.  Patient was seen by Dr. [name] in clinic on 11/13/2018.  Cervical exam reveals 2 cm exophytic irregular mass consistent with malignancy.    11/19/2018 CT chest abdomen pelvis with contrast showed thickened endometrium with some irregularity compatible with the provided diagnosis of endometrial malignancy.  There is a mildly prominent left inguinal node 1.4 cm.  Patient was seen by Dr. [name] on 11/20/2018 and left groin lymph node biopsy was recommended.  11/26/2018 patient underwent left inguinal lymph node biopsy. Pathology showed metastatic adenocarcinoma consistent with colorectal origin.  CDX 2+.  Case was discussed on tumor board.  Recommend colonoscopy for further evaluation.  Patient reports significant weight loss 30 pounds over the last year.  Chronic vaginal spotting. Change of bowel habits the past few months.  More constipated.  Family history positive for brother who has colon cancer prostate cancer.  patient has underwent colonoscopy on 12/03/2018 which reviewed a nonobstructing large mass in the rectum.  Also chronic fistula.  Mass was not circumferential.  This was biopsied with a cold forceps for histology.  Pathology came back hyperplastic polyp negative for dysplasia and malignancy. [name], patient underwent flex sigmoidoscopy on 12/06/2018 with rebiopsy of the rectal mass. This time biopsy results came back positive for invasive colorectal adenocarcinoma, moderately differentiated. Immunotherapy for nearly mismatch repair protein (MMR ) was performed.  There is no loss of MMR expression.  low probability of MSI high.   # Seen by [name] for evaluation of resectability for rectal  cancer. In addition, she also had a second opinion with [facility] pathology where her endometrial biopsy pathology was changed to  adenocarcinoma, consistent with colorectal primary.   [name]. She has home health that has been assisting with ostomy care  Patient was also evaluated by [facility] oncology.  Recommendation is to proceed with TNT with concurrent chemoradiation followed by neoadjuvant chemotherapy followed by surgical resection. Patient prefers to have treatment done locally with [facility].   # Oncology Treatment:  02/03/2019- 03/19/2019  concurrent Xeloda and radiation.  Xeloda dose 825mg  /m2 BID - rounded to 1650mg  BID- on days of radiation. 04/09/2019, started on FOLFOX with bolus early.  Omitted.  07/16/2019 finished 8 cycles of FOLFOX. 09/17/19 APR/posterior vaginectomy/TAH/BSO/VY-flap pT4b pN0 with close vaginal margin 0.2 mm.  Uterus and ovaries negative for malignancy.   10/06/2019 Cancer Staging   Staging form: Colon and Rectum, AJCC 8th Edition - Pathologic stage from 10/06/2019: Stage IIC (ypT4b, pN0, cM0) - Signed by [name], MD on 10/06/2019   06/30/2020 Cancer Staging   Staging form: Colon and Rectum, AJCC 8th Edition - Clinical stage from 06/30/2020: [name] - Signed by [name], MD on 04/17/2022 Stage prefix: Recurrence Total positive nodes: 1   06/30/2020 Relapse/Recurrence   vaginal introitus mass biopsied. Pathology is consistent with metastatic colorectal adenocarcinoma    07/20/2020 - 09/13/2020 Chemotherapy   Concurrent chemotherapy with Xeloda with Radiation.  08/02/2020-08/16/2020, . Xeloda was held due to neutropenia    08/17/2020 - 09/06/2020 Radiation Therapy   Xeloda 1500 mg twice daily concurrently with radiation    01/31/2021 Cancer Staging   Staging form: Colon and Rectum, AJCC 8th Edition - Pathologic: Stage Unknown (rpTX, pNX,  cM1) - Signed by [name], MD on 01/31/2021 Stage prefix: Recurrence   01/31/2021 - 08/20/2021  Chemotherapy   FOLFIRI + Bev  Irinotecan dose was reduced, eventually 100mg /m2  Irinotecan was dropped in November 2022 due to side effects. Negative for UGT1A1*28    09/13/2021 -  Chemotherapy   maintenance 5-FU/bevacizumab    11/28/2021 -  Chemotherapy   5-FU/Irinotecan/bevacizumab. Irinotecan 100 mg/m2 was added back due to progressively increasing CEA.    07/14/2022 Imaging   Bone scan  1. No evidence of skeletal metastatic disease in the hips. 2. Asymmetric increased activity in the RIGHT humeral head is favored degenerative as described above. If pain in the RIGHT shoulder consider further evaluation with plain film or MRI   07/17/2022 Imaging   1. Stable scattered small bilateral pulmonary nodules, largest 8 by 6 mm in the right middle lobe. 2. Subtle chronically stable posterior cortical irregularity and heterogeneity in the left iliac bone, not appreciable on the bone scan of 07/14/2022. This has been present at least over the past year and could reflect radiation therapy related findings although strictly speaking, malignant involvement of the left iliac bone is difficult to exclude. If clinically warranted this could be further assessed with nuclear medicine PET-CT or MRI of the bony pelvis with and without contrast. 3. Pelvic floor laxity with loops of small bowel extending 3 cm below the pubococcygeal line. 4. Aortic and systemic atherosclerosis as detailed above. Aortic Atherosclerosis    12/26/2022 Imaging   CT chest abdomen pelvis w contrast 1. Stable scattered solid pulmonary nodules. 2. Right inguinal and right pelvic lymph nodes which were hypermetabolic on prior PET-CT are stable. 3. Osseous lesion of the proximal right humerus demonstrates increased sclerosis when compared with the prior PET-CT, possibly due to treatment response. 4. 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 5. Aortic Atherosclerosis     06/25/2023 Imaging   CT chest abdomen pelvis w contrast  showed 1. Increased size of the solid bilateral pulmonary nodules,consistent with worsening metastatic disease. 2. Increased size of the right external iliac and inguinal lymph nodes, concerning for worsening metastatic disease. 3. No significant interval change in the left iliac bone osseous metastasis. 4. Enlarged main pulmonary artery measuring 3.6 cm, which can be seen in the setting of pulmonary arterial hypertension. 5.  Aortic Atherosclerosis (ICD10-I70.0)     Miscellaneous   Tempus liquid biopsy showed no actionable variants.  NOTCH1 missense variant, BRCA2 missense variant , MYC missense variant   07/11/2023 Imaging   PET showed 1. Multifocal tracer avid abdominopelvic lymph nodes are identified compatible with metastatic disease. 2. Multifocal bilateral pulmonary nodules are identified compatible with pulmonary metastasis.  3. Diffuse increased radiotracer uptake throughout the marrow space is identified diminishing sensitivity for detecting osseous metastasis. No abnormal increased radiotracer uptake identified above background increased bone marrow activity to indicate metabolically active bone metastases. 4.  Aortic Atherosclerosis   07/23/2023 -  Chemotherapy   switch to dose reduced FOLFOX    09/16/2023 Imaging   CT abdomen pelvis w contrast  1. Small-bowel obstruction with transition point in the anterior aspect of the lower abdomen, left of midline. This may be secondary to adhesions. 2. Small volume ascites. 3. Prior colon resection with left anterior abdominal wall colostomy. Adjacent parastomal hernia. Small volume fluid within the hernia sac. 4. Stable pulmonary metastatic disease. 5. Stable metastatic lesion of the left iliac wing. 6. Enlarged right external iliac chain lymph node measuring 1.4 cm, previously 1.3 cm. Several  mildly prominent retroperitoneal lymph nodes have increased in size from prior but remain subcentimeter in size. 7. Aortic atherosclerosis  (ICD10-I70.0).   09/16/2023 - 09/20/2023 Hospital Admission   Hospitalized due to Small bowel obstruction due to adhesions    09/24/2023 Imaging   1. Bilateral pulmonary metastases are all stable from 06/25/2023 chest CT. 2. No thoracic adenopathy. No new or progressive metastatic disease in the chest. 3. Trace bilateral posterior pleural effusions. 4. Two-vessel coronary atherosclerosis. 5. Dilated main pulmonary artery, suggesting pulmonary arterial hypertension. 6.  Aortic Atherosclerosis (ICD10-I70.0).   Metastatic adenocarcinoma [facility])       # Oncology Treatment:  02/03/2019- 03/19/2019  concurrent Xeloda and radiation.  Xeloda dose 825mg  /m2 BID - rounded to 1650mg  BID- on days of radiation. 04/09/2019, started on FOLFOX with bolus early.  Omitted.  07/16/2019 finished 8 cycles of FOLFOX. 09/17/19 APR/posterior vaginectomy/TAH/BSO/VY-flap pT4b pN0 with close vaginal margin 0.2 mm.  Uterus and ovaries negative for malignancy. Patient reports bilateral lower extremity numbness and tingling, intermittent, left worse than right. She has lost a lot of weight since her APR surgery.   #Family history with half brother having's history of colon cancer prostate cancer.  Personal history of colorectal cancer.  Patient has not decided if she wants genetic testing.    # history of PE( 01/13/2020)  in the bilateral lower extremity DVT (01/13/2020).   She finishes 6 months of anticoagulation with Eliquis 5 mg twice daily. Now switched to Eliquis 2.5 mg twice daily..  # She has now developed recurrent disease. #06/30/20  vaginal introitus mass biopsied. Pathology is consistent with metastatic colorectal adenocarcinoma I have discussed with [name]  Dr. Luciano Cutter and the mass is not resectable. Patient has also had colonoscopy by Dr. Tobi Bastos yesterday. Normal examination. # 07/16/2020 cycle 1 FOLFIRI  # 07/20/2020 PET scan was done for further evaluation, images are consistent with local recurrence, no  distant metastasis. #Discussed with radiation oncology Dr. Rushie Chestnut will recommends concurrent chemotherapy and radiation. 08/02/2020-08/16/2020, patient starts radiation.  Xeloda was held due to neutropenia 08/17/2020,-09/06/2020 Xeloda 1500 mg twice daily concurrently with radiation  01/31/21 started on FOLFIRI + Bev 05/18/2021 CT chest abdomen pelvis showed Previously noted enlargement of bilateral inguinal lymph nodes is resolved, consistent with treatment response of nodal metastatic disease. Interval decrease in size of multiple small bilateral pulmonary nodules, consistent with treatment response of pulmonary metastatic disease. No evidence of new metastatic disease. 05/24/2021 - 08/30/2021, continued on FOLFIRI plus bevacizumab.  Irinotecan dose was reduced, eventually 100mg /m2  09/02/2021, CT chest abdomen pelvis without contrast Showed small bilateral pulmonary nodules, unchanged.  Stable metastatic disease.  No noncontrast evidence of new metastatic disease in the chest abdomen pelvis.  Small parastomal hernia.  Enlargement of main pulmonary artery.  Coronary artery disease.  09/13/2021, maintenance 5-FU/bevacizumab 11/28/2021, 5-FU/Irinotecan/bevacizumab.  Irinotecan 100 mg/m2 was added back due to progressively increasing CEA.  -right lower extremity edema, US showed non occlusive DVT, edema has improved.   INTERVAL HISTORY Jacqueline Yoder is a 78 y.o. female who has above history reviewed by me presents for follow-up of rectal cancer. On Irinotecan and panitumumab. she tolerates well.   She was accompanied by her daughter. + she reports manageable neuropathy symptoms of her fingertips, intermittent,  worse when exposed to cold temperature.  + chronic loose ostomy output, stable, not worse.    Review of Systems  Constitutional:  Positive for fatigue. Negative for appetite change, chills, fever and unexpected weight change.  HENT:   Negative  for hearing loss and voice change.   Eyes:   Negative for eye problems.  Respiratory:  Negative for chest tightness and cough.   Cardiovascular:  Negative for chest pain.  Gastrointestinal:  Positive for diarrhea. Negative for abdominal distention, abdominal pain, blood in stool, constipation and nausea.  Endocrine: Negative for hot flashes.  Genitourinary:  Negative for difficulty urinating and frequency.   Musculoskeletal:  Positive for arthralgias.  Skin:  Negative for itching.  Neurological:  Positive for numbness. Negative for extremity weakness.  Hematological:  Negative for adenopathy.  Psychiatric/Behavioral:  Negative for confusion.     MEDICAL HISTORY:  Past Medical History:  Diagnosis Date   Allergy    Arthritis    Blood clot in vein    Family history of colon cancer    GERD (gastroesophageal reflux disease)    Hypercholesteremia    Hypertension    Hypertension    Lower extremity edema    Personal history of chemotherapy    Rectal cancer (HCC) 12/2018   Urinary incontinence     SURGICAL HISTORY: Past Surgical History:  Procedure Laterality Date   ABDOMINAL HYSTERECTOMY     CHOLECYSTECTOMY  1971   COLONOSCOPY WITH PROPOFOL N/A 12/03/2018   Procedure: COLONOSCOPY WITH PROPOFOL;  Surgeon: Midge Minium, MD;  Location: [facility] ENDOSCOPY;  Service: Endoscopy;  Laterality: N/A;   COLONOSCOPY WITH PROPOFOL N/A 07/15/2020   Procedure: COLONOSCOPY WITH PROPOFOL;  Surgeon: Wyline Mood, MD;  Location: [facility] ENDOSCOPY;  Service: Gastroenterology;  Laterality: N/A;   FLEXIBLE SIGMOIDOSCOPY N/A 12/06/2018   Procedure: FLEXIBLE SIGMOIDOSCOPY;  Surgeon: Wyline Mood, MD;  Location: [facility] ENDOSCOPY;  Service: Endoscopy;  Laterality: N/A;   LAPAROSCOPIC COLOSTOMY  01/06/2019   PORTACATH PLACEMENT N/A 04/03/2019   Procedure: INSERTION PORT-A-CATH;  Surgeon: Leafy Ro, MD;  Location: [facility] ORS;  Service: General;  Laterality: N/A;    SOCIAL HISTORY: Social History   Socioeconomic History   Marital status: Widowed    Spouse  name: Not on file   Number of children: Not on file   Years of education: Not on file   Highest education level: Some college, no degree  Occupational History   Not on file  Tobacco Use   Smoking status: Former    Current packs/day: 0.00    Types: Cigarettes    Quit date: 12/02/1977    Years since quitting: 45.9   Smokeless tobacco: Former  Building services engineer status: Never Used  Substance and Sexual Activity   Alcohol use: Never   Drug use: Never   Sexual activity: Not Currently    Birth control/protection: None  Other Topics Concern   Not on file  Social History Narrative   Lives with daughter   Social Drivers of Health   Financial Resource Strain: Low Risk  (09/26/2023)   Overall Financial Resource Strain (CARDIA)    Difficulty of Paying Living Expenses: Not very hard  Food Insecurity: No Food Insecurity (09/27/2023)   Hunger Vital Sign    Worried About Running Out of Food in the Last Year: Never true    Ran Out of Food in the Last Year: Never true  Transportation Needs: No Transportation Needs (09/27/2023)   PRAPARE - Administrator, Civil Service (Medical): No    Lack of Transportation (Non-Medical): No  Physical Activity: Insufficiently Active (09/26/2023)   Exercise Vital Sign    Days of Exercise per Week: 2 days    Minutes of Exercise per Session: 10 min  Stress:  No Stress Concern Present (09/26/2023)   [employer] of Occupational Health - Occupational Stress Questionnaire    Feeling of Stress : Not at all  Social Connections: Moderately Integrated (09/27/2023)   Social Connection and Isolation Panel [NHANES]    Frequency of Communication with Friends and Family: More than three times a week    Frequency of Social Gatherings with Friends and Family: More than three times a week    Attends Religious Services: More than 4 times per year    Active Member of [employer] or Organizations: Yes    Attends Banker Meetings: More than 4 times  per year    Marital Status: Widowed  Recent Concern: Social Connections - Moderately Isolated (07/06/2023)   Social Connection and Isolation Panel [NHANES]    Frequency of Communication with Friends and Family: More than three times a week    Frequency of Social Gatherings with Friends and Family: More than three times a week    Attends Religious Services: More than 4 times per year    Active Member of [employer] or Organizations: No    Attends Banker Meetings: Never    Marital Status: Widowed  Intimate Partner Violence: Not At Risk (09/27/2023)   Humiliation, Afraid, Rape, and Kick questionnaire    Fear of Current or Ex-Partner: No    Emotionally Abused: No    Physically Abused: No    Sexually Abused: No    FAMILY HISTORY: Family History  Problem Relation Age of Onset   Colon cancer Brother 78       exposure to chemicals [place]   Hypertension Mother    Stroke Mother    Kidney failure Father    Breast cancer Neg Hx    Ovarian cancer Neg Hx     ALLERGIES:  is allergic to sulfamethoxazole-trimethoprim.  MEDICATIONS:  Current Outpatient Medications  Medication Sig Dispense Refill   acetaminophen (TYLENOL) 650 MG CR tablet Take 650 mg by mouth every 8 (eight) hours as needed for pain.     Cholecalciferol (VITAMIN D3) 2000 units capsule Take 2,000 Units by mouth daily.     clindamycin (CLINDAGEL) 1 % gel Apply topically 2 (two) times daily. 30 g 5   diclofenac sodium (VOLTAREN) 1 % GEL Apply 2 g topically 4 (four) times daily as needed (joint pain).  11   diphenoxylate-atropine (LOMOTIL) 2.5-0.025 MG tablet Take 1 tablet by mouth 4 (four) times daily as needed for diarrhea or loose stools. 120 tablet 0   ELIQUIS 2.5 MG TABS tablet TAKE 1 TABLET BY MOUTH TWICE  DAILY 200 tablet 2   fluticasone (FLONASE) 50 MCG/ACT nasal spray Place 1 spray into both nostrils daily as needed for allergies or rhinitis.     gentamicin cream (GARAMYCIN) 0.1 % Apply to affected toe once daily.  30 g 1   ipratropium (ATROVENT) 0.03 % nasal spray USE 1 SPRAY IN BOTH NOSTRILS  TWICE DAILY (Patient taking differently: Place 1 spray into both nostrils 2 (two) times daily as needed for rhinitis.) 60 mL 2   ketoconazole (NIZORAL) 2 % cream Apply 1 Application topically daily. For up to 2 weeks. May repeat if need. 30 g 2   lidocaine-prilocaine (EMLA) cream Apply 1 application. topically as needed. 30 g 6   loratadine (CLARITIN) 10 MG tablet Take 10 mg by mouth daily.     magnesium chloride (SLOW-MAG) 64 MG TBEC SR tablet Take 1 tablet (64 mg total) by mouth 2 (two) times daily. 60 tablet  2   Multiple Vitamins-Minerals (ONE-A-DAY WOMENS 50 PLUS PO) Take 1 tablet by mouth daily.      nystatin (MYCOSTATIN/NYSTOP) powder APPLY 1 POWDER TOPICALLY THREE TIMES DAILY 45 g 0   potassium chloride SA (KLOR-CON M) 20 MEQ tablet TAKE 1 TABLET BY MOUTH DAILY 100 tablet 2   simvastatin (ZOCOR) 40 MG tablet Take 1 tablet (40 mg total) by mouth at bedtime. 100 tablet 3   triamcinolone cream (KENALOG) 0.5 % Apply 1 Application topically 2 (two) times daily. To affected areas, for up to 2 weeks. 30 g 2   zinc gluconate 50 MG tablet Take 50 mg by mouth daily.     No current facility-administered medications for this visit.   Facility-Administered Medications Ordered in Other Visits  Medication Dose Route Frequency Provider Last Rate Last Admin   fluorouracil (ADRUCIL) 4,350 mg in sodium chloride 0.9 % 63 mL chemo infusion  2,400 mg/m2 (Treatment Plan Recorded) Intravenous 1 day or 1 dose [name], MD       heparin lock flush 100 UNIT/ML injection            leucovorin 724 mg in dextrose 5 % 250 mL infusion  400 mg/m2 (Treatment Plan Recorded) Intravenous Once [name], MD       oxaliplatin (ELOXATIN) 125 mg in dextrose 5 % 500 mL chemo infusion  70 mg/m2 (Treatment Plan Recorded) Intravenous Once [name], MD         PHYSICAL EXAMINATION: ECOG PERFORMANCE STATUS: 1 - Symptomatic but completely  ambulatory  Physical Exam Constitutional:      General: She is not in acute distress. HENT:     Head: Normocephalic and atraumatic.  Eyes:     General: No scleral icterus. Cardiovascular:     Rate and Rhythm: Normal rate.     Heart sounds: Normal heart sounds.  Pulmonary:     Effort: Pulmonary effort is normal. No respiratory distress.     Breath sounds: No wheezing.  Abdominal:     General: Bowel sounds are normal. There is no distension.     Palpations: Abdomen is soft.     Comments: + Colostomy bag   Musculoskeletal:        General: No deformity. Normal range of motion.     Cervical back: Normal range of motion and neck supple.     Right lower leg: Edema present.  Skin:    General: Skin is warm and dry.  Neurological:     Mental Status: She is alert and oriented to person, place, and time. Mental status is at baseline.     Cranial Nerves: No cranial nerve deficit.     Coordination: Coordination normal.       LABORATORY DATA:  I have reviewed the data as listed    Latest Ref Rng & Units 11/13/2023    8:00 AM 10/30/2023    7:50 AM 10/16/2023    9:11 AM  CBC  WBC 4.0 - 10.5 K/uL 2.8  3.7  5.3   Hemoglobin 12.0 - 15.0 g/dL 16.1  09.6  04.5   Hematocrit 36.0 - 46.0 % 32.9  32.6  33.2   Platelets 150 - 400 K/uL 176  198  209       Latest Ref Rng & Units 11/13/2023    8:00 AM 10/30/2023    7:50 AM 10/16/2023    9:11 AM  CMP  Glucose 70 - 99 mg/dL 409  811  914   BUN 8 - 23  mg/dL 13  12  15    Creatinine 0.44 - 1.00 mg/dL 1.61  0.96  0.45   Sodium 135 - 145 mmol/L 139  138  137   Potassium 3.5 - 5.1 mmol/L 3.9  4.1  4.0   Chloride 98 - 111 mmol/L 105  104  104   CO2 22 - 32 mmol/L 26  26  25    Calcium 8.9 - 10.3 mg/dL 9.2  9.0  9.0   Total Protein 6.5 - 8.1 g/dL 6.5  6.5  6.6   Total Bilirubin 0.0 - 1.2 mg/dL 0.6  0.4  0.6   Alkaline Phos 38 - 126 U/L 72  82  54   AST 15 - 41 U/L 37  43  50   ALT 0 - 44 U/L 15  18  25       RADIOGRAPHIC STUDIES: I have  personally reviewed the radiological images as listed and agreed with the findings in the report. CT Chest W Contrast Result Date: 09/24/2023 CLINICAL DATA:  Metastatic rectal cancer on palliative chemotherapy. Restaging. * Tracking Code: BO * EXAM: CT CHEST WITH CONTRAST TECHNIQUE: Multidetector CT imaging of the chest was performed during intravenous contrast administration. RADIATION DOSE REDUCTION: This exam was performed according to the departmental dose-optimization program which includes automated exposure control, adjustment of the mA and/or kV according to patient size and/or use of iterative reconstruction technique. CONTRAST:  75mL OMNIPAQUE IOHEXOL 300 MG/ML  SOLN COMPARISON:  06/25/2023 chest CT.  07/09/2023 PET-CT. FINDINGS: Cardiovascular: Normal heart size. No significant pericardial effusion/thickening. Left anterior descending and right coronary atherosclerosis. Right internal jugular Port-A-Cath terminates at the cavoatrial junction. Atherosclerotic nonaneurysmal thoracic aorta. Dilated main pulmonary artery (3.5 cm diameter). No central pulmonary emboli. Mediastinum/Nodes: No significant thyroid nodules. Unremarkable esophagus. No pathologically enlarged axillary, mediastinal or hilar lymph nodes. Lungs/Pleura: No pneumothorax. Trace bilateral posterior pleural effusions. No acute consolidative airspace disease. Numerous (greater than 20) solid pulmonary nodules scattered throughout both lungs involving all lung lobes, all stable in size since 06/25/2023 chest CT with representative 1.6 x 1.4 cm medial right middle lobe nodule (series 4/image 101), 0.7 cm anterior left lower lobe nodule (series 4/image 110) and 0.7 cm left upper lobe nodule (series 4/image 64). No new significant pulmonary nodules. Upper abdomen: Numerous simple upper renal cysts bilaterally, largest 5.9 cm on the right, for which no follow-up imaging is recommended. Musculoskeletal: No aggressive appearing focal osseous  lesions. Moderate thoracic spondylosis. IMPRESSION: 1. Bilateral pulmonary metastases are all stable from 06/25/2023 chest CT. 2. No thoracic adenopathy. No new or progressive metastatic disease in the chest. 3. Trace bilateral posterior pleural effusions. 4. Two-vessel coronary atherosclerosis. 5. Dilated main pulmonary artery, suggesting pulmonary arterial hypertension. 6.  Aortic Atherosclerosis (ICD10-I70.0). Electronically Signed   By: Delbert Phenix M.D.   On: 09/24/2023 19:15   DG ABD ACUTE 2+V W 1V CHEST Result Date: 09/19/2023 CLINICAL DATA:  Small bowel obstruction. EXAM: DG ABDOMEN ACUTE WITH 1 VIEW CHEST COMPARISON:  September 18, 2023. FINDINGS: Right internal jugular Port-A-Cath is noted. Right basilar atelectasis or infiltrate is noted. Probable small pleural effusions are noted. Nasogastric tube tip seen in proximal stomach. No abnormal bowel dilatation is noted. Postsurgical changes are noted in right upper quadrant. IMPRESSION: Right basilar atelectasis or infiltrate with probable small pleural effusions. Nasogastric tube tip seen in proximal stomach. No abnormal bowel dilatation. Electronically Signed   By: Lupita Raider M.D.   On: 09/19/2023 11:20   DG Abd 1 View Result Date:  09/18/2023 CLINICAL DATA:  78 year old female with abdominal distension. Small-bowel obstruction. EXAM: ABDOMEN - 1 VIEW COMPARISON:  09/16/2023 radiograph and CT Abdomen and Pelvis. FINDINGS: Enteric tube remains in place, side hole at the level of the gastric body. Paucity of bowel gas similar to the CT scout view 2 days ago. Stable abdominal wall surgical clips or sutures. Stable lung bases. Stable visualized osseous structures. Vascular calcifications. IMPRESSION: 1. Stable and satisfactory enteric tube placement. 2. Persistent paucity of bowel gas similar to the CT scout view. Residual small-bowel obstruction difficult to exclude. Electronically Signed   By: Odessa Fleming M.D.   On: 09/18/2023 08:37   DG Abd 1  View Result Date: 09/16/2023 CLINICAL DATA:  X-ray for NG tube placement EXAM: ABDOMEN - 1 VIEW limited for tube placement COMPARISON:  CT earlier 09/16/2023 FINDINGS: Limited x-ray of the upper abdomen demonstrates enteric tube with tip overlying the midbody of the stomach. Surgical changes in the midabdomen. Overlapping cardiac leads. IMPRESSION: Limited x-ray for tube placement has a enteric tube with tip overlying the midbody of the stomach. Electronically Signed   By: Karen Kays M.D.   On: 09/16/2023 18:09   CT ABDOMEN PELVIS W CONTRAST Result Date: 09/16/2023 CLINICAL DATA:  Abdominal pain and nausea EXAM: CT ABDOMEN AND PELVIS WITH CONTRAST TECHNIQUE: Multidetector CT imaging of the abdomen and pelvis was performed using the standard protocol following bolus administration of intravenous contrast. RADIATION DOSE REDUCTION: This exam was performed according to the departmental dose-optimization program which includes automated exposure control, adjustment of the mA and/or kV according to patient size and/or use of iterative reconstruction technique. CONTRAST:  80mL OMNIPAQUE IOHEXOL 300 MG/ML  SOLN COMPARISON:  06/20/2023 FINDINGS: Lower chest: 1.8 x 1.4 cm right middle lobe nodule (series 4, image 16), not significantly changed in size. Additional scattered subcentimeter bibasilar nodules are also unchanged. Mild bibasilar subsegmental atelectasis. Circumferential thickening of the distal esophagus. Hepatobiliary: Stable 8 mm subcapsular lesion in the anterior inferior right hepatic lobe (series 2, image 37). No new focal liver abnormality. Prior cholecystectomy with similar degree of biliary dilatation. Pancreas: Mild ductal dilatation without focal lesion or inflammatory changes. Spleen: Stable splenic cyst.  No splenomegaly. Adrenals/Urinary Tract: Unremarkable adrenal glands. Multiple bilateral renal cysts which require no follow-up imaging. No renal stone or hydronephrosis. Urinary bladder within  normal limits. Stomach/Bowel: Prior colon resection with left anterior abdominal wall colostomy. Adjacent parastomal hernia. Small volume fluid within the hernia sac. Stomach is distended. Multiple dilated loops of small bowel throughout the abdomen with transition point in the anterior aspect of the lower abdomen, left of midline (series 2, image 54). Normal appendix in the right lower quadrant. Vascular/Lymphatic: Aortoiliac atherosclerosis. Enlarged right external iliac chain lymph node measuring 1.4 cm (series 2, image 68), previously 1.3 cm. Several mildly prominent retroperitoneal lymph nodes have increased in size from prior but remain subcentimeter in size. Reproductive: Status post hysterectomy. No adnexal masses. Other: Small volume ascites.  No pneumoperitoneum. Musculoskeletal: Permeative metastatic lesion the left iliac wing not appreciably changed. No pathologic fracture. No new bony lesion identified. IMPRESSION: 1. Small-bowel obstruction with transition point in the anterior aspect of the lower abdomen, left of midline. This may be secondary to adhesions. 2. Small volume ascites. 3. Prior colon resection with left anterior abdominal wall colostomy. Adjacent parastomal hernia. Small volume fluid within the hernia sac. 4. Stable pulmonary metastatic disease. 5. Stable metastatic lesion of the left iliac wing. 6. Enlarged right external iliac chain lymph node measuring 1.4 cm,  previously 1.3 cm. Several mildly prominent retroperitoneal lymph nodes have increased in size from prior but remain subcentimeter in size. 7. Aortic atherosclerosis (ICD10-I70.0). Electronically Signed   By: Duanne Guess D.O.   On: 09/16/2023 14:07      CT Chest W Contrast Result Date: 09/24/2023 CLINICAL DATA:  Metastatic rectal cancer on palliative chemotherapy. Restaging. * Tracking Code: BO * EXAM: CT CHEST WITH CONTRAST TECHNIQUE: Multidetector CT imaging of the chest was performed during intravenous contrast  administration. RADIATION DOSE REDUCTION: This exam was performed according to the departmental dose-optimization program which includes automated exposure control, adjustment of the mA and/or kV according to patient size and/or use of iterative reconstruction technique. CONTRAST:  75mL OMNIPAQUE IOHEXOL 300 MG/ML  SOLN COMPARISON:  06/25/2023 chest CT.  07/09/2023 PET-CT. FINDINGS: Cardiovascular: Normal heart size. No significant pericardial effusion/thickening. Left anterior descending and right coronary atherosclerosis. Right internal jugular Port-A-Cath terminates at the cavoatrial junction. Atherosclerotic nonaneurysmal thoracic aorta. Dilated main pulmonary artery (3.5 cm diameter). No central pulmonary emboli. Mediastinum/Nodes: No significant thyroid nodules. Unremarkable esophagus. No pathologically enlarged axillary, mediastinal or hilar lymph nodes. Lungs/Pleura: No pneumothorax. Trace bilateral posterior pleural effusions. No acute consolidative airspace disease. Numerous (greater than 20) solid pulmonary nodules scattered throughout both lungs involving all lung lobes, all stable in size since 06/25/2023 chest CT with representative 1.6 x 1.4 cm medial right middle lobe nodule (series 4/image 101), 0.7 cm anterior left lower lobe nodule (series 4/image 110) and 0.7 cm left upper lobe nodule (series 4/image 64). No new significant pulmonary nodules. Upper abdomen: Numerous simple upper renal cysts bilaterally, largest 5.9 cm on the right, for which no follow-up imaging is recommended. Musculoskeletal: No aggressive appearing focal osseous lesions. Moderate thoracic spondylosis. IMPRESSION: 1. Bilateral pulmonary metastases are all stable from 06/25/2023 chest CT. 2. No thoracic adenopathy. No new or progressive metastatic disease in the chest. 3. Trace bilateral posterior pleural effusions. 4. Two-vessel coronary atherosclerosis. 5. Dilated main pulmonary artery, suggesting pulmonary arterial  hypertension. 6.  Aortic Atherosclerosis (ICD10-I70.0). Electronically Signed   By: Delbert Phenix M.D.   On: 09/24/2023 19:15   DG ABD ACUTE 2+V W 1V CHEST Result Date: 09/19/2023 CLINICAL DATA:  Small bowel obstruction. EXAM: DG ABDOMEN ACUTE WITH 1 VIEW CHEST COMPARISON:  September 18, 2023. FINDINGS: Right internal jugular Port-A-Cath is noted. Right basilar atelectasis or infiltrate is noted. Probable small pleural effusions are noted. Nasogastric tube tip seen in proximal stomach. No abnormal bowel dilatation is noted. Postsurgical changes are noted in right upper quadrant. IMPRESSION: Right basilar atelectasis or infiltrate with probable small pleural effusions. Nasogastric tube tip seen in proximal stomach. No abnormal bowel dilatation. Electronically Signed   By: Lupita Raider M.D.   On: 09/19/2023 11:20   DG Abd 1 View Result Date: 09/18/2023 CLINICAL DATA:  78 year old female with abdominal distension. Small-bowel obstruction. EXAM: ABDOMEN - 1 VIEW COMPARISON:  09/16/2023 radiograph and CT Abdomen and Pelvis. FINDINGS: Enteric tube remains in place, side hole at the level of the gastric body. Paucity of bowel gas similar to the CT scout view 2 days ago. Stable abdominal wall surgical clips or sutures. Stable lung bases. Stable visualized osseous structures. Vascular calcifications. IMPRESSION: 1. Stable and satisfactory enteric tube placement. 2. Persistent paucity of bowel gas similar to the CT scout view. Residual small-bowel obstruction difficult to exclude. Electronically Signed   By: Odessa Fleming M.D.   On: 09/18/2023 08:37   DG Abd 1 View Result Date: 09/16/2023 CLINICAL DATA:  X-ray  for NG tube placement EXAM: ABDOMEN - 1 VIEW limited for tube placement COMPARISON:  CT earlier 09/16/2023 FINDINGS: Limited x-ray of the upper abdomen demonstrates enteric tube with tip overlying the midbody of the stomach. Surgical changes in the midabdomen. Overlapping cardiac leads. IMPRESSION: Limited x-ray for  tube placement has a enteric tube with tip overlying the midbody of the stomach. Electronically Signed   By: Karen Kays M.D.   On: 09/16/2023 18:09   CT ABDOMEN PELVIS W CONTRAST Result Date: 09/16/2023 CLINICAL DATA:  Abdominal pain and nausea EXAM: CT ABDOMEN AND PELVIS WITH CONTRAST TECHNIQUE: Multidetector CT imaging of the abdomen and pelvis was performed using the standard protocol following bolus administration of intravenous contrast. RADIATION DOSE REDUCTION: This exam was performed according to the departmental dose-optimization program which includes automated exposure control, adjustment of the mA and/or kV according to patient size and/or use of iterative reconstruction technique. CONTRAST:  80mL OMNIPAQUE IOHEXOL 300 MG/ML  SOLN COMPARISON:  06/20/2023 FINDINGS: Lower chest: 1.8 x 1.4 cm right middle lobe nodule (series 4, image 16), not significantly changed in size. Additional scattered subcentimeter bibasilar nodules are also unchanged. Mild bibasilar subsegmental atelectasis. Circumferential thickening of the distal esophagus. Hepatobiliary: Stable 8 mm subcapsular lesion in the anterior inferior right hepatic lobe (series 2, image 37). No new focal liver abnormality. Prior cholecystectomy with similar degree of biliary dilatation. Pancreas: Mild ductal dilatation without focal lesion or inflammatory changes. Spleen: Stable splenic cyst.  No splenomegaly. Adrenals/Urinary Tract: Unremarkable adrenal glands. Multiple bilateral renal cysts which require no follow-up imaging. No renal stone or hydronephrosis. Urinary bladder within normal limits. Stomach/Bowel: Prior colon resection with left anterior abdominal wall colostomy. Adjacent parastomal hernia. Small volume fluid within the hernia sac. Stomach is distended. Multiple dilated loops of small bowel throughout the abdomen with transition point in the anterior aspect of the lower abdomen, left of midline (series 2, image 54). Normal appendix  in the right lower quadrant. Vascular/Lymphatic: Aortoiliac atherosclerosis. Enlarged right external iliac chain lymph node measuring 1.4 cm (series 2, image 68), previously 1.3 cm. Several mildly prominent retroperitoneal lymph nodes have increased in size from prior but remain subcentimeter in size. Reproductive: Status post hysterectomy. No adnexal masses. Other: Small volume ascites.  No pneumoperitoneum. Musculoskeletal: Permeative metastatic lesion the left iliac wing not appreciably changed. No pathologic fracture. No new bony lesion identified. IMPRESSION: 1. Small-bowel obstruction with transition point in the anterior aspect of the lower abdomen, left of midline. This may be secondary to adhesions. 2. Small volume ascites. 3. Prior colon resection with left anterior abdominal wall colostomy. Adjacent parastomal hernia. Small volume fluid within the hernia sac. 4. Stable pulmonary metastatic disease. 5. Stable metastatic lesion of the left iliac wing. 6. Enlarged right external iliac chain lymph node measuring 1.4 cm, previously 1.3 cm. Several mildly prominent retroperitoneal lymph nodes have increased in size from prior but remain subcentimeter in size. 7. Aortic atherosclerosis (ICD10-I70.0). Electronically Signed   By: Duanne Guess D.O.   On: 09/16/2023 14:07

## 2023-11-13 NOTE — Progress Notes (Signed)
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  Receiving reduced fluorouracil and oxaliplatin.    Met with patient during infusion.  Sleepy from benadryl.  Patient reports good appetite.  Drinking ensure when does not eat very much.  Had toast and ensure this am before coming.  Reports that bowels are moving well.  No diarrhea.      Medications: reviewed  Labs: reviewed  Anthropometrics:   Weight 169 lb   163 lb on 12/31 162 lb on 11/09/21 (last seen by RD)   NUTRITION DIAGNOSIS: Altered GI functioning ongoing   INTERVENTION:  Continue lower fiber foods with history of bowel obstruction.   Continue oral nutrition supplement for added calories and protein    MONITORING, EVALUATION, GOAL: weight trends, intake   NEXT VISIT: as needed  Jaylianna Tatlock B. Freida Busman, RD, LDN Registered Dietitian [phone]

## 2023-11-13 NOTE — Assessment & Plan Note (Signed)
History of PE and bilateral lower extremity DVT  05/18/23 Repeat right LE US showed chronic non occlusive DVT Continue  Eliquis 2.5 mg twice daily for anticoagulation prophylaxis. Recommend leg elevation and compression stocking.

## 2023-11-13 NOTE — Assessment & Plan Note (Signed)
Hb stable, observation

## 2023-11-13 NOTE — Assessment & Plan Note (Signed)
Continue potassium chloride 20 mEq daily.

## 2023-11-13 NOTE — Assessment & Plan Note (Signed)
Refer to vascular surgeon per patient's request.  Leg elevation and compression stocking.

## 2023-11-13 NOTE — Assessment & Plan Note (Addendum)
History of stage IIIC Rectal cancer, s/p TNT, followed by 09/17/19 APR/posterior vaginectomy/TAH/BSO/VY-flap, pT4b pN0 with close vaginal margin 0.2 mm.  Uterus and ovaries negative for malignancy. palliative radiation to vaginal recurrence- 01/19/21 recurrence with lung metastasis.-Palliative -FOLFIRI plus bevacizumab.  Irinotecan was dropped in November 2022 due to side effects. Negative for UGT1A1*28 - radiographically stable, rise of CEA-July 2023 CT lung metastasis worse--> Dec 2023 PET showed progression in pelvic lymph nodes and bone lesions,--> 2nd line irinotecan +panitumumab-->CT March 2024 stable. --> June 2024 CT showed mild lung progression/Progressive left iliac bone metastasis, vulvar mass difficult for biopsy in office,  Tempus Liquid biopsy - no actionable variants --> 06/25/2023 CT showed progression of lung nodules [SLD increase 35mm] and iliac inguinal lymphadenopathy--> 07/23/23 switch to dose reduced FOLFOX --> Dec 2024 CT- stable disease.   Labs are reviewed and discussed with patient,  CEA nadir was in 490, progressively increases - 6000sShe has tolerated FOLFOX well- grade 1 neuropathy Recommend continue dose reduced FOLFOX . Repeat CT

## 2023-11-13 NOTE — Assessment & Plan Note (Signed)
If magnesium is 1.0 - 1.2:  administrate 6 gm IV magnesium sulfate If magnesium is 1.2-1.4, administrate 4 gm IV magnesium sulfate If magnesium is 1.5-1.6:  administrate 2 gm IV magnesium sulfate.  Continue slow mag orally 64mg  twice daily.

## 2023-11-13 NOTE — Patient Instructions (Signed)
[facility] - A DEPT OF [facility]  Discharge Instructions: Thank you for choosing [facility] to provide your oncology and hematology care.  If you have a lab appointment with the Cancer Center, please go directly to the Cancer Center and check in at the registration area.  Wear comfortable clothing and clothing appropriate for easy access to any Portacath or PICC line.   We strive to give you quality time with your provider. You may need to reschedule your appointment if you arrive late (15 or more minutes).  Arriving late affects you and other patients whose appointments are after yours.  Also, if you miss three or more appointments without notifying the office, you may be dismissed from the clinic at the provider's discretion.      For prescription refill requests, have your pharmacy contact our office and allow 72 hours for refills to be completed.     To help prevent nausea and vomiting after your treatment, we encourage you to take your nausea medication as directed.  BELOW ARE SYMPTOMS THAT SHOULD BE REPORTED IMMEDIATELY: *FEVER GREATER THAN 100.4 F (38 C) OR HIGHER *CHILLS OR SWEATING *NAUSEA AND VOMITING THAT IS NOT CONTROLLED WITH YOUR NAUSEA MEDICATION *UNUSUAL SHORTNESS OF BREATH *UNUSUAL BRUISING OR BLEEDING *URINARY PROBLEMS (pain or burning when urinating, or frequent urination) *BOWEL PROBLEMS (unusual diarrhea, constipation, pain near the anus) TENDERNESS IN MOUTH AND THROAT WITH OR WITHOUT PRESENCE OF ULCERS (sore throat, sores in mouth, or a toothache) UNUSUAL RASH, SWELLING OR PAIN  UNUSUAL VAGINAL DISCHARGE OR ITCHING   Items with * indicate a potential emergency and should be followed up as soon as possible or go to the Emergency Department if any problems should occur.  Please show the CHEMOTHERAPY ALERT CARD or IMMUNOTHERAPY ALERT CARD at check-in to the Emergency Department and triage nurse.  Should you have  questions after your visit or need to cancel or reschedule your appointment, please contact [facility] - A DEPT OF [facility]  [phone] and follow the prompts.  Office hours are 8:00 a.m. to 4:30 p.m. Monday - Friday. Please note that voicemails left after 4:00 p.m. may not be returned until the following business day.  We are closed weekends and major holidays. You have access to a nurse at all times for urgent questions. Please call the main number to the clinic [phone] and follow the prompts.  For any non-urgent questions, you may also contact your provider using MyChart. We now offer e-Visits for anyone 78 and older to request care online for non-urgent symptoms. For details visit [email or web address].   Also download the MyChart app! Go to the app store, search "MyChart", open the app, select [place], and log in with your MyChart username and password.

## 2023-11-13 NOTE — Assessment & Plan Note (Signed)
continue lomotil  QID PRN as instructed.  She has not needed to take antidiarrhea.

## 2023-11-13 NOTE — Assessment & Plan Note (Signed)
Stable symptoms, Grade 1-2.patient copes well.

## 2023-11-14 DIAGNOSIS — Z7901 Long term (current) use of anticoagulants: Secondary | ICD-10-CM | POA: Diagnosis not present

## 2023-11-14 DIAGNOSIS — B372 Candidiasis of skin and nail: Secondary | ICD-10-CM | POA: Diagnosis not present

## 2023-11-14 DIAGNOSIS — Z87891 Personal history of nicotine dependence: Secondary | ICD-10-CM | POA: Diagnosis not present

## 2023-11-14 DIAGNOSIS — I10 Essential (primary) hypertension: Secondary | ICD-10-CM | POA: Diagnosis not present

## 2023-11-14 DIAGNOSIS — Z556 Problems related to health literacy: Secondary | ICD-10-CM | POA: Diagnosis not present

## 2023-11-14 DIAGNOSIS — Z79899 Other long term (current) drug therapy: Secondary | ICD-10-CM | POA: Diagnosis not present

## 2023-11-14 LAB — CEA: CEA: 7505 ng/mL — ABNORMAL HIGH (ref 0.0–4.7)

## 2023-11-15 ENCOUNTER — Encounter: Payer: Self-pay | Admitting: Oncology

## 2023-11-15 ENCOUNTER — Inpatient Hospital Stay: Payer: 59

## 2023-11-15 ENCOUNTER — Other Ambulatory Visit: Payer: Self-pay | Admitting: Oncology

## 2023-11-15 ENCOUNTER — Ambulatory Visit: Payer: 59 | Admitting: Internal Medicine

## 2023-11-15 VITALS — BP 136/70 | HR 91 | Temp 99.1°F | Ht 64.0 in | Wt 171.2 lb

## 2023-11-15 VITALS — BP 157/81 | HR 99 | Temp 99.4°F | Resp 18

## 2023-11-15 DIAGNOSIS — R188 Other ascites: Secondary | ICD-10-CM | POA: Diagnosis not present

## 2023-11-15 DIAGNOSIS — D6481 Anemia due to antineoplastic chemotherapy: Secondary | ICD-10-CM | POA: Diagnosis not present

## 2023-11-15 DIAGNOSIS — G629 Polyneuropathy, unspecified: Secondary | ICD-10-CM | POA: Diagnosis not present

## 2023-11-15 DIAGNOSIS — Z86711 Personal history of pulmonary embolism: Secondary | ICD-10-CM | POA: Diagnosis not present

## 2023-11-15 DIAGNOSIS — Z86718 Personal history of other venous thrombosis and embolism: Secondary | ICD-10-CM | POA: Diagnosis not present

## 2023-11-15 DIAGNOSIS — Z79899 Other long term (current) drug therapy: Secondary | ICD-10-CM | POA: Diagnosis not present

## 2023-11-15 DIAGNOSIS — I10 Essential (primary) hypertension: Secondary | ICD-10-CM | POA: Diagnosis not present

## 2023-11-15 DIAGNOSIS — K435 Parastomal hernia without obstruction or  gangrene: Secondary | ICD-10-CM | POA: Diagnosis not present

## 2023-11-15 DIAGNOSIS — C20 Malignant neoplasm of rectum: Secondary | ICD-10-CM

## 2023-11-15 DIAGNOSIS — Z7901 Long term (current) use of anticoagulants: Secondary | ICD-10-CM | POA: Diagnosis not present

## 2023-11-15 DIAGNOSIS — J90 Pleural effusion, not elsewhere classified: Secondary | ICD-10-CM | POA: Diagnosis not present

## 2023-11-15 DIAGNOSIS — E876 Hypokalemia: Secondary | ICD-10-CM | POA: Diagnosis not present

## 2023-11-15 DIAGNOSIS — R634 Abnormal weight loss: Secondary | ICD-10-CM | POA: Diagnosis not present

## 2023-11-15 DIAGNOSIS — C7801 Secondary malignant neoplasm of right lung: Secondary | ICD-10-CM | POA: Diagnosis not present

## 2023-11-15 DIAGNOSIS — I700 Atherosclerosis of aorta: Secondary | ICD-10-CM | POA: Diagnosis not present

## 2023-11-15 DIAGNOSIS — Z933 Colostomy status: Secondary | ICD-10-CM | POA: Diagnosis not present

## 2023-11-15 DIAGNOSIS — K219 Gastro-esophageal reflux disease without esophagitis: Secondary | ICD-10-CM | POA: Diagnosis not present

## 2023-11-15 DIAGNOSIS — Z5111 Encounter for antineoplastic chemotherapy: Secondary | ICD-10-CM | POA: Diagnosis not present

## 2023-11-15 DIAGNOSIS — U071 COVID-19: Secondary | ICD-10-CM

## 2023-11-15 DIAGNOSIS — C7802 Secondary malignant neoplasm of left lung: Secondary | ICD-10-CM | POA: Diagnosis not present

## 2023-11-15 DIAGNOSIS — C7951 Secondary malignant neoplasm of bone: Secondary | ICD-10-CM | POA: Diagnosis not present

## 2023-11-15 DIAGNOSIS — R197 Diarrhea, unspecified: Secondary | ICD-10-CM | POA: Diagnosis not present

## 2023-11-15 DIAGNOSIS — C799 Secondary malignant neoplasm of unspecified site: Secondary | ICD-10-CM

## 2023-11-15 DIAGNOSIS — E7800 Pure hypercholesterolemia, unspecified: Secondary | ICD-10-CM | POA: Diagnosis not present

## 2023-11-15 DIAGNOSIS — I2510 Atherosclerotic heart disease of native coronary artery without angina pectoris: Secondary | ICD-10-CM | POA: Diagnosis not present

## 2023-11-15 LAB — POC COVID19/FLU A&B COMBO
Covid Antigen, POC: POSITIVE — AB
Influenza A Antigen, POC: NEGATIVE
Influenza B Antigen, POC: NEGATIVE

## 2023-11-15 MED ORDER — HEPARIN SOD (PORK) LOCK FLUSH 100 UNIT/ML IV SOLN
500.0000 [IU] | Freq: Once | INTRAVENOUS | Status: AC | PRN
  Administered 2023-11-15: 500 [IU]
  Filled 2023-11-15: qty 5

## 2023-11-15 MED ORDER — NIRMATRELVIR/RITONAVIR (PAXLOVID)TABLET: 3.0000 | ORAL_TABLET | Freq: Two times a day (BID) | ORAL | 0 refills | Status: AC

## 2023-11-15 MED ORDER — SODIUM CHLORIDE 0.9% FLUSH
10.0000 mL | INTRAVENOUS | Status: DC | PRN
  Administered 2023-11-15: 10 mL
  Filled 2023-11-15: qty 10

## 2023-11-15 MED ORDER — BENZONATATE 100 MG PO CAPS: 100.0000 mg | ORAL_CAPSULE | Freq: Three times a day (TID) | ORAL | 0 refills | Status: DC | PRN

## 2023-11-15 NOTE — Patient Instructions (Signed)
COVID-19 COVID-19 is an infection caused by a virus called SARS-CoV-2. This type of virus is called a coronavirus. People with COVID-19 may: Have little to no symptoms. Have mild to moderate symptoms that affect their lungs and breathing. Get very sick. What are the causes? COVID-19 is caused by a virus. This virus may be in the air as droplets or on surfaces. It can spread from an infected person when they cough, sneeze, speak, sing, or breathe. You may become infected if: You breathe in the infected droplets in the air. You touch an object that has the virus on it. What increases the risk? You are at risk of getting COVID-19 if you have been around someone with the infection. You may be more likely to get very sick if: You are 77 years old or older. You have certain medical conditions, such as: Heart disease. Diabetes. Chronic respiratory disease. Cancer. Pregnancy. You are immunocompromised. This means your body cannot fight infections easily. You have a disability or trouble moving, meaning you're immobile. What are the signs or symptoms? People may have different symptoms from COVID-19. The symptoms can also be mild to severe. They often show up in 5-6 days after being infected. But they can take up to 14 days to appear. Common symptoms are: Cough. Feeling tired. New loss of taste or smell. Fever. Less common symptoms are: Sore throat. Headache. Body or muscle aches. Diarrhea. A skin rash or odd-colored fingers or toes. Red or irritated eyes. Sometimes, COVID-19 does not cause symptoms. How is this diagnosed? COVID-19 can be diagnosed with tests done in the lab or at home. Fluid from your nose, mouth, or lungs will be used to check for the virus. How is this treated? Treatment for COVID-19 depends on how sick you are. Mild symptoms can be treated at home with rest, fluids, and over-the-counter medicines. Severe symptoms may be treated in a hospital intensive care unit  (ICU). If you have symptoms and are at risk of getting very sick, you may be given a medicine that fights viruses. This medicine is called an antiviral. How is this prevented? To protect yourself from COVID-19: Know your risk factors. Get vaccinated. If your body cannot fight infections easily, talk to your provider about treatment to help prevent COVID-19. Stay at least 1 meter away from others. Wear a well-fitted mask when: You can't stay at a distance from people. You're in a place with poor air flow. Try to be in open spaces with good air flow when in public. Wash your hands often or use an alcohol-based hand sanitizer. Cover your nose and mouth when coughing and sneezing. If you think you have COVID-19 or have been around someone who has it, stay home and be by yourself for 5-10 days. Where to find more information Centers for Disease Control and Prevention (CDC): TonerPromos.no World Health Organization [facility]): VisitDestination.com.br Get help right away if: You have trouble breathing or get short of breath. You have pain or pressure in your chest. You cannot speak or move any part of your body. You are confused. Your symptoms get worse. These symptoms may be an emergency. Get help right away. Call 911. Do not wait to see if the symptoms will go away. Do not drive yourself to the hospital. This information is not intended to replace advice given to you by your health care provider. Make sure you discuss any questions you have with your health care provider. Document Revised: 10/06/2022 Document Reviewed: 06/16/2022 Elsevier Patient Education  2024 Elsevier Inc.

## 2023-11-15 NOTE — Progress Notes (Signed)
Subjective:    Patient ID: Jacqueline Yoder, female    DOB: Aug 03, 1946, 78 y.o.   MRN: [id number]  HPI  Discussed the use of AI scribe software for clinical note transcription with the patient, who gave verbal consent to proceed.  The patient, with colon cancer and lung metastasis, presents with a runny nose and cough following chemotherapy treatment.  She has been experiencing a runny nose and cough since Wednesday, following chemotherapy treatment on Tuesday. There is no current phlegm production, although she previously had morning drainage and coughed up phlegm. No headaches, ear pain, sore throat, shortness of breath, nausea, vomiting, or diarrhea are present.  She notes a temperature of 99.52F, which she acknowledges is not quite a fever. She denies feeling like she has the flu or COVID, mentioning she has had her flu shot and consistently wears a mask. She is concerned about her symptoms potentially affecting an upcoming CT scan scheduled for next Thursday.  She takes Claritin and uses Atrovent nasal spray for her nasal symptoms.      Review of Systems   Past Medical History:  Diagnosis Date   Allergy    Arthritis    Blood clot in vein    Family history of colon cancer    GERD (gastroesophageal reflux disease)    Hypercholesteremia    Hypertension    Hypertension    Lower extremity edema    Personal history of chemotherapy    Rectal cancer (HCC) 12/2018   Urinary incontinence     Current Outpatient Medications  Medication Sig Dispense Refill   acetaminophen (TYLENOL) 650 MG CR tablet Take 650 mg by mouth every 8 (eight) hours as needed for pain.     Cholecalciferol (VITAMIN D3) 2000 units capsule Take 2,000 Units by mouth daily.     clindamycin (CLINDAGEL) 1 % gel Apply topically 2 (two) times daily. 30 g 5   diclofenac sodium (VOLTAREN) 1 % GEL Apply 2 g topically 4 (four) times daily as needed (joint pain).  11   diphenoxylate-atropine (LOMOTIL) 2.5-0.025 MG tablet  Take 1 tablet by mouth 4 (four) times daily as needed for diarrhea or loose stools. 120 tablet 0   ELIQUIS 2.5 MG TABS tablet TAKE 1 TABLET BY MOUTH TWICE  DAILY 200 tablet 2   fluticasone (FLONASE) 50 MCG/ACT nasal spray Place 1 spray into both nostrils daily as needed for allergies or rhinitis.     gentamicin cream (GARAMYCIN) 0.1 % Apply to affected toe once daily. 30 g 1   ipratropium (ATROVENT) 0.03 % nasal spray USE 1 SPRAY IN BOTH NOSTRILS  TWICE DAILY (Patient taking differently: Place 1 spray into both nostrils 2 (two) times daily as needed for rhinitis.) 60 mL 2   ketoconazole (NIZORAL) 2 % cream Apply 1 Application topically daily. For up to 2 weeks. May repeat if need. 30 g 2   lidocaine-prilocaine (EMLA) cream Apply 1 application. topically as needed. 30 g 6   loratadine (CLARITIN) 10 MG tablet Take 10 mg by mouth daily.     magnesium chloride (SLOW-MAG) 64 MG TBEC SR tablet Take 1 tablet (64 mg total) by mouth 2 (two) times daily. 60 tablet 2   Multiple Vitamins-Minerals (ONE-A-DAY WOMENS 50 PLUS PO) Take 1 tablet by mouth daily.      nystatin (MYCOSTATIN/NYSTOP) powder APPLY 1 POWDER TOPICALLY THREE TIMES DAILY 45 g 0   potassium chloride SA (KLOR-CON M) 20 MEQ tablet TAKE 1 TABLET BY MOUTH DAILY 100 tablet 2  simvastatin (ZOCOR) 40 MG tablet Take 1 tablet (40 mg total) by mouth at bedtime. 100 tablet 3   triamcinolone cream (KENALOG) 0.5 % Apply 1 Application topically 2 (two) times daily. To affected areas, for up to 2 weeks. 30 g 2   zinc gluconate 50 MG tablet Take 50 mg by mouth daily.     No current facility-administered medications for this visit.   Facility-Administered Medications Ordered in Other Visits  Medication Dose Route Frequency Provider Last Rate Last Admin   heparin lock flush 100 UNIT/ML injection             Allergies  Allergen Reactions   Sulfamethoxazole-Trimethoprim Other (See Comments)    Also said she had chills and pressure in nose.    Family  History  Problem Relation Age of Onset   Colon cancer Brother 8       exposure to chemicals [place]   Hypertension Mother    Stroke Mother    Kidney failure Father    Breast cancer Neg Hx    Ovarian cancer Neg Hx     Social History   Socioeconomic History   Marital status: Widowed    Spouse name: Not on file   Number of children: Not on file   Years of education: Not on file   Highest education level: Some college, no degree  Occupational History   Not on file  Tobacco Use   Smoking status: Former    Current packs/day: 0.00    Types: Cigarettes    Quit date: 12/02/1977    Years since quitting: 45.9   Smokeless tobacco: Former  Building services engineer status: Never Used  Substance and Sexual Activity   Alcohol use: Never   Drug use: Never   Sexual activity: Not Currently    Birth control/protection: None  Other Topics Concern   Not on file  Social History Narrative   Lives with daughter   Social Drivers of Health   Financial Resource Strain: Low Risk  (11/15/2023)   Overall Financial Resource Strain (CARDIA)    Difficulty of Paying Living Expenses: Not hard at all  Food Insecurity: No Food Insecurity (11/15/2023)   Hunger Vital Sign    Worried About Running Out of Food in the Last Year: Never true    Ran Out of Food in the Last Year: Never true  Transportation Needs: No Transportation Needs (11/15/2023)   PRAPARE - Administrator, Civil Service (Medical): No    Lack of Transportation (Non-Medical): No  Physical Activity: Insufficiently Active (11/15/2023)   Exercise Vital Sign    Days of Exercise per Week: 3 days    Minutes of Exercise per Session: 10 min  Stress: No Stress Concern Present (11/15/2023)   [employer] of Occupational Health - Occupational Stress Questionnaire    Feeling of Stress : Not at all  Social Connections: Moderately Integrated (11/15/2023)   Social Connection and Isolation Panel [NHANES]    Frequency of Communication with  Friends and Family: More than three times a week    Frequency of Social Gatherings with Friends and Family: More than three times a week    Attends Religious Services: More than 4 times per year    Active Member of [employer] or Organizations: Yes    Attends Banker Meetings: More than 4 times per year    Marital Status: Widowed  Intimate Partner Violence: Not At Risk (09/27/2023)   Humiliation, Afraid, Rape, and Kick questionnaire  Fear of Current or Ex-Partner: No    Emotionally Abused: No    Physically Abused: No    Sexually Abused: No     Constitutional: Denies fever, malaise, fatigue, headache or abrupt weight changes.  HEENT: Pt reports runny nose. Denies eye pain, eye redness, ear pain, ringing in the ears, wax buildup, nasal congestion, bloody nose, or sore throat. Respiratory: Pt reports cough. Denies difficulty breathing, shortness of breath, or sputum production.   Cardiovascular: Denies chest pain, chest tightness, palpitations or swelling in the hands or feet.  Gastrointestinal: Denies abdominal pain, bloating, constipation, diarrhea or blood in the stool.  Musculoskeletal: Denies decrease in range of motion, difficulty with gait, muscle pain or joint pain and swelling.  Skin: Denies redness, rashes, lesions or ulcercations.  Neurological: Denies dizziness, difficulty with memory, difficulty with speech or problems with balance and coordination.    No other specific complaints in a complete review of systems (except as listed in HPI above).      Objective:   Physical Exam  BP 136/70   Pulse 91   Temp 99.1 F (37.3 C)   Ht 5\' 4"  (1.626 m)   Wt 171 lb 3.2 oz (77.7 kg)   SpO2 97%   BMI 29.39 kg/m   Wt Readings from Last 3 Encounters:  11/13/23 169 lb (76.7 kg)  11/09/23 167 lb 4.8 oz (75.9 kg)  10/30/23 167 lb 4.8 oz (75.9 kg)    General: Appears her stated age, overweight, in NAD. Skin: Warm, dry and intact. No rashes noted. HEENT: Head: normal  shape and size, no sinus tenderness noted; Eyes: sclera white, no icterus, conjunctiva pink, PERRLA and EOMs intact; Nose: mucosa boggy and moist, turban swollen; Throat/Mouth: Teeth present, mucosa pink and moist, + PND, no exudate, lesions or ulcerations noted.  Neck: No adenopathy noted. Cardiovascular: Normal rate and rhythm. S1,S2 noted.  No murmur, rubs or gallops noted.  Pulmonary/Chest: Normal effort and positive vesicular breath sounds. No respiratory distress. No wheezes, rales or ronchi noted.  Musculoskeletal:No difficulty with gait.  Neurological: Alert and oriented.   BMET    Component Value Date/Time   NA 139 11/13/2023 0800   K 3.9 11/13/2023 0800   CL 105 11/13/2023 0800   CO2 26 11/13/2023 0800   GLUCOSE 110 (H) 11/13/2023 0800   BUN 13 11/13/2023 0800   CREATININE 0.72 11/13/2023 0800   CREATININE 1.10 (H) 06/24/2021 0802   CALCIUM 9.2 11/13/2023 0800   GFRNONAA >60 11/13/2023 0800   GFRAA >60 07/16/2020 0802    Lipid Panel     Component Value Date/Time   CHOL 166 07/04/2023 0845   TRIG 55 07/04/2023 0845   HDL 70 07/04/2023 0845   CHOLHDL 2.4 07/04/2023 0845   LDLCALC 82 07/04/2023 0845    CBC    Component Value Date/Time   WBC 2.8 (L) 11/13/2023 0800   WBC 3.3 (L) 09/20/2023 0426   RBC 3.53 (L) 11/13/2023 0800   HGB 10.3 (L) 11/13/2023 0800   HCT 32.9 (L) 11/13/2023 0800   PLT 176 11/13/2023 0800   MCV 93.2 11/13/2023 0800   MCH 29.2 11/13/2023 0800   MCHC 31.3 11/13/2023 0800   RDW 18.6 (H) 11/13/2023 0800   LYMPHSABS 1.1 11/13/2023 0800   MONOABS 0.5 11/13/2023 0800   EOSABS 0.1 11/13/2023 0800   BASOSABS 0.0 11/13/2023 0800    Hgb A1C Lab Results  Component Value Date   HGBA1C 5.1 07/04/2023  Assessment & Plan:  Assessment and Plan    COVID-19 New onset cough and rhinorrhea since Wednesday. No fever, shortness of breath, or systemic symptoms. Nasal inflammation and postnasal drainage noted on exam. Likely viral  etiology, but given the patient's history of cancer and ongoing chemotherapy, testing for COVID and influenza is warranted.  COVID positive.  Flu negative. -Continue Claritin as currently prescribed. -Continue Atrovent nasal spray as currently prescribed. -Start Tessalon 100mg  TID for cough. -Rx for Paxlovid 3 tabs twice daily x 5 days, advised her to hold simvastatin while taking this medication.  Advised her to check with pharmacist regarding her Eliquis. -If symptoms worsen, patient to notify office on Monday.    Follow-up with your PCP as previously scheduled Nicki Reaper, NP

## 2023-11-15 NOTE — Patient Instructions (Signed)
Implanted [facility] Guide An implanted port is a device that is placed under the skin. It is usually placed in the chest. The device may vary based on the need. Implanted ports can be used to give IV medicine, to take blood, or to give fluids. You may have an implanted port if: You need IV medicine that would be irritating to the small veins in your hands or arms. You need IV medicines, such as chemotherapy, for a long period of time. You need IV nutrition for a long period of time. You may have fewer limitations when using a port than you would if you used other types of long-term IVs. You will also likely be able to return to normal activities after your incision heals. An implanted port has two main parts: Reservoir. The reservoir is the part where a needle is inserted to give medicines or draw blood. The reservoir is round. After the port is placed, it appears as a small, raised area under your skin. Catheter. The catheter is a small, thin tube that connects the reservoir to a vein. Medicine that is inserted into the reservoir goes into the catheter and then into the vein. How is my port accessed? To access your port: A numbing cream may be placed on the skin over the port site. Your health care provider will put on a mask and sterile gloves. The skin over your port will be cleaned carefully with a germ-killing soap and allowed to dry. Your health care provider will gently pinch the port and insert a needle into it. Your health care provider will check for a blood return to make sure the port is in the vein and is still working (patent). If your port needs to remain accessed to get medicine continuously (constant infusion), your health care provider will place a clear bandage (dressing) over the needle site. The dressing and needle will need to be changed every week, or as told by your health care provider. What is flushing? Flushing helps keep the port working. Follow instructions from your  health care provider about how and when to flush the port. Ports are usually flushed with saline solution or a medicine called heparin. The need for flushing will depend on how the port is used: If the port is only used from time to time to give medicines or draw blood, the port may need to be flushed: Before and after medicines have been given. Before and after blood has been drawn. As part of routine maintenance. Flushing may be recommended every 4-6 weeks. If a constant infusion is running, the port may not need to be flushed. Throw away any syringes in a disposal container that is meant for sharp items (sharps container). You can buy a sharps container from a pharmacy, or you can make one by using an empty hard plastic bottle with a cover. How long will my port stay implanted? The port can stay in for as long as your health care provider thinks it is needed. When it is time for the port to come out, a surgery will be done to remove it. The surgery will be similar to the procedure that was done to put the port in. Follow these instructions at home: Caring for your port and port site Flush your port as told by your health care provider. If you need an infusion over several days, follow instructions from your health care provider about how to take care of your port site. Make sure you: Change your  dressing as told by your health care provider. Wash your hands with soap and water for at least 20 seconds before and after you change your dressing. If soap and water are not available, use alcohol-based hand sanitizer. Place any used dressings or infusion bags into a plastic bag. Throw that bag in the trash. Keep the dressing that covers the needle clean and dry. Do not get it wet. Do not use scissors or sharp objects near the infusion tubing. Keep any external tubes clamped, unless they are being used. Check your port site every day for signs of infection. Check for: Redness, swelling, or  pain. Fluid or blood. Warmth. Pus or a bad smell. Protect the skin around the port site. Avoid wearing bra straps that rub or irritate the site. Protect the skin around your port from seat belts. Place a soft pad over your chest if needed. Bathe or shower as told by your health care provider. The site may get wet as long as you are not actively receiving an infusion. General instructions  Return to your normal activities as told by your health care provider. Ask your health care provider what activities are safe for you. Carry a medical alert card or wear a medical alert bracelet at all times. This will let health care providers know that you have an implanted port in case of an emergency. Where to find more information American Cancer Society: www.cancer.org American Society of Clinical Oncology: www.cancer.net Contact a health care provider if: You have a fever or chills. You have redness, swelling, or pain at the port site. You have fluid or blood coming from your port site. Your incision feels warm to the touch. You have pus or a bad smell coming from the port site. Summary Implanted ports are usually placed in the chest for long-term IV access. Follow instructions from your health care provider about flushing the port and changing bandages (dressings). Take care of the area around your port by avoiding clothing that puts pressure on the area, and by watching for signs of infection. Protect the skin around your port from seat belts. Place a soft pad over your chest if needed. Contact a health care provider if you have a fever or you have redness, swelling, pain, fluid, or a bad smell at the port site. This information is not intended to replace advice given to you by your health care provider. Make sure you discuss any questions you have with your health care provider. Document Revised: 04/05/2021 Document Reviewed: 04/05/2021 Elsevier Patient Education  2024 [employer].

## 2023-11-19 ENCOUNTER — Ambulatory Visit
Admission: RE | Admit: 2023-11-19 | Discharge: 2023-11-19 | Disposition: A | Discharge status: Home / Self Care | Payer: 59 | Source: Ambulatory Visit | Attending: Family Medicine | Admitting: Family Medicine

## 2023-11-19 DIAGNOSIS — C189 Malignant neoplasm of colon, unspecified: Secondary | ICD-10-CM | POA: Diagnosis not present

## 2023-11-19 DIAGNOSIS — Z780 Asymptomatic menopausal state: Secondary | ICD-10-CM | POA: Diagnosis not present

## 2023-11-22 ENCOUNTER — Encounter: Payer: Self-pay | Admitting: Family Medicine

## 2023-11-22 ENCOUNTER — Ambulatory Visit: Payer: 59

## 2023-11-26 ENCOUNTER — Encounter (INDEPENDENT_AMBULATORY_CARE_PROVIDER_SITE_OTHER): Payer: Self-pay | Admitting: Nurse Practitioner

## 2023-11-26 ENCOUNTER — Ambulatory Visit (INDEPENDENT_AMBULATORY_CARE_PROVIDER_SITE_OTHER): Payer: 59 | Admitting: Nurse Practitioner

## 2023-11-26 VITALS — BP 145/80 | HR 86 | Resp 16 | Wt 170.8 lb

## 2023-11-26 DIAGNOSIS — N182 Chronic kidney disease, stage 2 (mild): Secondary | ICD-10-CM

## 2023-11-26 DIAGNOSIS — I890 Lymphedema, not elsewhere classified: Secondary | ICD-10-CM | POA: Diagnosis not present

## 2023-11-26 DIAGNOSIS — I129 Hypertensive chronic kidney disease with stage 1 through stage 4 chronic kidney disease, or unspecified chronic kidney disease: Secondary | ICD-10-CM | POA: Diagnosis not present

## 2023-11-26 DIAGNOSIS — I87009 Postthrombotic syndrome without complications of unspecified extremity: Secondary | ICD-10-CM | POA: Diagnosis not present

## 2023-11-26 NOTE — Progress Notes (Signed)
 Subjective:    Patient ID: Jacqueline Yoder, female    DOB: May 31, 1946, 78 y.o.   MRN: [id number] Chief Complaint  Patient presents with   New Patient (Initial Visit)    Ref Wilhelmenia Harada consult chronic dvt    The patient is a 78 year old female who presents today for swelling in her right lower extremity.  She has a history of rectal cancer with lung metastasis.  She had a significant right lower extremity DVT several years ago.  Since that time she has been swelling.  She notes that recently the swelling has been worse.  She remains on Eliquis  2.5 mg twice daily.  She also utilizes medical grade compression and elevation.  She is active but despite these conservative therapy is her swelling is not well-maintained or well-controlled.  Currently she has no evidence of open wounds or cellulitis.    Review of Systems  Cardiovascular:  Positive for leg swelling.  All other systems reviewed and are negative.      Objective:   Physical Exam Vitals reviewed.  HENT:     Head: Normocephalic.  Cardiovascular:     Rate and Rhythm: Normal rate.  Pulmonary:     Effort: Pulmonary effort is normal.  Musculoskeletal:     Right lower leg: Edema present.  Skin:    General: Skin is warm and dry.  Neurological:     Mental Status: She is alert and oriented to person, place, and time.  Psychiatric:        Mood and Affect: Mood normal.        Behavior: Behavior normal.        Thought Content: Thought content normal.        Judgment: Judgment normal.     BP (!) 145/80   Pulse 86   Resp 16   Wt 170 lb 12.8 oz (77.5 kg)   BMI 29.32 kg/m   Past Medical History:  Diagnosis Date   Allergy    Arthritis    Blood clot in vein    Family history of colon cancer    GERD (gastroesophageal reflux disease)    Hypercholesteremia    Hypertension    Hypertension    Lower extremity edema    Personal history of chemotherapy    Rectal cancer (HCC) 12/2018   Urinary incontinence     Social History    Socioeconomic History   Marital status: Widowed    Spouse name: Not on file   Number of children: Not on file   Years of education: Not on file   Highest education level: Some college, no degree  Occupational History   Not on file  Tobacco Use   Smoking status: Former    Current packs/day: 0.00    Types: Cigarettes    Quit date: 12/02/1977    Years since quitting: 46.0   Smokeless tobacco: Former  Building services engineer status: Never Used  Substance and Sexual Activity   Alcohol use: Never   Drug use: Never   Sexual activity: Not Currently    Birth control/protection: None  Other Topics Concern   Not on file  Social History Narrative   Lives with daughter   Social Drivers of Health   Financial Resource Strain: Low Risk  (11/15/2023)   Overall Financial Resource Strain (CARDIA)    Difficulty of Paying Living Expenses: Not hard at all  Food Insecurity: No Food Insecurity (11/15/2023)   Hunger Vital Sign    Worried About Running Out  of Food in the Last Year: Never true    Ran Out of Food in the Last Year: Never true  Transportation Needs: No Transportation Needs (11/15/2023)   PRAPARE - Administrator, Civil Service (Medical): No    Lack of Transportation (Non-Medical): No  Physical Activity: Insufficiently Active (11/15/2023)   Exercise Vital Sign    Days of Exercise per Week: 3 days    Minutes of Exercise per Session: 10 min  Stress: No Stress Concern Present (11/15/2023)   [employer] of Occupational Health - Occupational Stress Questionnaire    Feeling of Stress : Not at all  Social Connections: Moderately Integrated (11/15/2023)   Social Connection and Isolation Panel [NHANES]    Frequency of Communication with Friends and Family: More than three times a week    Frequency of Social Gatherings with Friends and Family: More than three times a week    Attends Religious Services: More than 4 times per year    Active Member of [employer] or Organizations: Yes     Attends Banker Meetings: More than 4 times per year    Marital Status: Widowed  Intimate Partner Violence: Not At Risk (09/27/2023)   Humiliation, Afraid, Rape, and Kick questionnaire    Fear of Current or Ex-Partner: No    Emotionally Abused: No    Physically Abused: No    Sexually Abused: No    Past Surgical History:  Procedure Laterality Date   ABDOMINAL HYSTERECTOMY     CHOLECYSTECTOMY  1971   COLONOSCOPY WITH PROPOFOL  N/A 12/03/2018   Procedure: COLONOSCOPY WITH PROPOFOL ;  Surgeon: Marnee Sink, MD;  Location: [facility] ENDOSCOPY;  Service: Endoscopy;  Laterality: N/A;   COLONOSCOPY WITH PROPOFOL  N/A 07/15/2020   Procedure: COLONOSCOPY WITH PROPOFOL ;  Surgeon: Luke Salaam, MD;  Location: [facility] ENDOSCOPY;  Service: Gastroenterology;  Laterality: N/A;   FLEXIBLE SIGMOIDOSCOPY N/A 12/06/2018   Procedure: FLEXIBLE SIGMOIDOSCOPY;  Surgeon: Luke Salaam, MD;  Location: [facility] ENDOSCOPY;  Service: Endoscopy;  Laterality: N/A;   LAPAROSCOPIC COLOSTOMY  01/06/2019   PORTACATH PLACEMENT N/A 04/03/2019   Procedure: INSERTION PORT-A-CATH;  Surgeon: Alben Alma, MD;  Location: [facility] ORS;  Service: General;  Laterality: N/A;    Family History  Problem Relation Age of Onset   Colon cancer Brother 31       exposure to chemicals [place]   Hypertension Mother    Stroke Mother    Kidney failure Father    Breast cancer Neg Hx    Ovarian cancer Neg Hx     Allergies  Allergen Reactions   Sulfamethoxazole-Trimethoprim Other (See Comments)    Also said she had chills and pressure in nose.       Latest Ref Rng & Units 11/13/2023    8:00 AM 10/30/2023    7:50 AM 10/16/2023    9:11 AM  CBC  WBC 4.0 - 10.5 K/uL 2.8  3.7  5.3   Hemoglobin 12.0 - 15.0 g/dL 16.1  09.6  04.5   Hematocrit 36.0 - 46.0 % 32.9  32.6  33.2   Platelets 150 - 400 K/uL 176  198  209       CMP     Component Value Date/Time   NA 139 11/13/2023 0800   K 3.9 11/13/2023 0800   CL 105 11/13/2023 0800   CO2 26  11/13/2023 0800   GLUCOSE 110 (H) 11/13/2023 0800   BUN 13 11/13/2023 0800   CREATININE 0.72 11/13/2023 0800   CREATININE 1.10 (H)  06/24/2021 0802   CALCIUM  9.2 11/13/2023 0800   PROT 6.5 11/13/2023 0800   ALBUMIN 3.6 11/13/2023 0800   AST 37 11/13/2023 0800   ALT 15 11/13/2023 0800   ALKPHOS 72 11/13/2023 0800   BILITOT 0.6 11/13/2023 0800   EGFR 53 (L) 06/24/2021 0802   GFRNONAA >60 11/13/2023 0800     No results found.     Assessment & Plan:   1. Lymphedema (Primary) Recommend:  No surgery or intervention at this point in time.   The Patient is CEAP [id number].  The patient has been wearing compression for more than 12 weeks with no or little benefit.  The patient has been exercising daily for more than 12 weeks. The patient has been elevating and taking OTC pain medications for more than 12 weeks.  None of these have have eliminated the pain related to the lymphedema or the discomfort regarding excessive swelling and venous congestion.    I have reviewed my discussion with the patient regarding lymphedema and why it  causes symptoms.  Patient will continue wearing graduated compression on a daily basis. The patient should put the compression on first thing in the morning and removing them in the evening. The patient should not sleep in the compression.   In addition, behavioral modification throughout the day will be continued.  This will include frequent elevation (such as in a recliner), use of over the counter pain medications as needed and exercise such as walking.  The systemic causes for chronic edema such as liver, kidney and cardiac etiologies do not appear to have significant changed over the past year.    The patient has chronic , severe lymphedema with hyperpigmentation of the skin and has done MLD, skin care, medication, diet, exercise, elevation and compression for 4 weeks with no improvement,  I am recommending a lymphedema pump.  The patient still has stage 3  lymphedema and therefore, I believe that a lymph pump is needed to improve the control of the patient's lymphedema and improve the quality of life.  Additionally, a lymph pump is warranted because it will reduce the risk of cellulitis and ulceration in the future.    2. Postthrombotic syndrome of lower extremity I suspect that the driving cause behind the swelling is postphlebitic syndrome.  The DVT has resolved but she still has substantial swelling in the right lower extremity.  I suspect as result she has significant deep venous insufficiency.  Will also have her return for venous reflux study to evaluate.  3. Benign hypertension with CKD (chronic kidney disease), stage II Continue antihypertensive medications as already ordered, these medications have been reviewed and there are no changes at this time.   Current Outpatient Medications on File Prior to Visit  Medication Sig Dispense Refill   acetaminophen  (TYLENOL ) 650 MG CR tablet Take 650 mg by mouth every 8 (eight) hours as needed for pain.     benzonatate  (TESSALON ) 100 MG capsule Take 1 capsule (100 mg total) by mouth 3 (three) times daily as needed for cough. 30 capsule 0   Cholecalciferol  (VITAMIN D3) 2000 units capsule Take 2,000 Units by mouth daily.     clindamycin  (CLINDAGEL) 1 % gel Apply topically 2 (two) times daily. 30 g 5   diclofenac  sodium (VOLTAREN ) 1 % GEL Apply 2 g topically 4 (four) times daily as needed (joint pain).  11   diphenoxylate -atropine  (LOMOTIL ) 2.5-0.025 MG tablet Take 1 tablet by mouth 4 (four) times daily as needed for diarrhea or  loose stools. 120 tablet 0   ELIQUIS  2.5 MG TABS tablet TAKE 1 TABLET BY MOUTH TWICE  DAILY 200 tablet 2   fluticasone  (FLONASE ) 50 MCG/ACT nasal spray Place 1 spray into both nostrils daily as needed for allergies or rhinitis.     gentamicin  cream (GARAMYCIN ) 0.1 % Apply to affected toe once daily. 30 g 1   ipratropium (ATROVENT ) 0.03 % nasal spray USE 1 SPRAY IN BOTH NOSTRILS   TWICE DAILY (Patient taking differently: Place 1 spray into both nostrils 2 (two) times daily as needed for rhinitis.) 60 mL 2   ketoconazole  (NIZORAL ) 2 % cream Apply 1 Application topically daily. For up to 2 weeks. May repeat if need. 30 g 2   lidocaine -prilocaine  (EMLA ) cream Apply 1 application. topically as needed. 30 g 6   loratadine  (CLARITIN ) 10 MG tablet Take 10 mg by mouth daily.     magnesium  chloride (SLOW-MAG) 64 MG TBEC SR tablet Take 1 tablet (64 mg total) by mouth 2 (two) times daily. 60 tablet 2   Multiple Vitamins-Minerals (ONE-A-DAY WOMENS 50 PLUS PO) Take 1 tablet by mouth daily.      nystatin  (MYCOSTATIN /NYSTOP ) powder APPLY 1 POWDER TOPICALLY THREE TIMES DAILY 45 g 0   potassium chloride  SA (KLOR-CON  M) 20 MEQ tablet TAKE 1 TABLET BY MOUTH DAILY 100 tablet 2   simvastatin  (ZOCOR ) 40 MG tablet Take 1 tablet (40 mg total) by mouth at bedtime. 100 tablet 3   triamcinolone  cream (KENALOG ) 0.5 % Apply 1 Application topically 2 (two) times daily. To affected areas, for up to 2 weeks. 30 g 2   zinc gluconate 50 MG tablet Take 50 mg by mouth daily.     [DISCONTINUED] prochlorperazine  (COMPAZINE ) 10 MG tablet Take 1 tablet (10 mg total) by mouth every 6 (six) hours as needed (NAUSEA). (Patient not taking: Reported on 10/16/2023) 30 tablet 1   Current Facility-Administered Medications on File Prior to Visit  Medication Dose Route Frequency Provider Last Rate Last Admin   heparin  lock flush 100 UNIT/ML injection             There are no Patient Instructions on file for this visit. No follow-ups on file.   Aaradhya Kysar E Manuella Blackson, NP

## 2023-11-27 ENCOUNTER — Ambulatory Visit: Payer: 59 | Admitting: Internal Medicine

## 2023-11-27 ENCOUNTER — Ambulatory Visit: Payer: 59 | Admitting: Oncology

## 2023-11-27 ENCOUNTER — Ambulatory Visit: Payer: 59

## 2023-11-27 ENCOUNTER — Encounter: Payer: Self-pay | Admitting: Internal Medicine

## 2023-11-27 ENCOUNTER — Other Ambulatory Visit: Payer: 59

## 2023-11-27 VITALS — BP 144/78 | Ht 64.0 in | Wt 168.0 lb

## 2023-11-27 DIAGNOSIS — R202 Paresthesia of skin: Secondary | ICD-10-CM

## 2023-11-27 DIAGNOSIS — M542 Cervicalgia: Secondary | ICD-10-CM

## 2023-11-27 DIAGNOSIS — M25511 Pain in right shoulder: Secondary | ICD-10-CM

## 2023-11-27 DIAGNOSIS — G8929 Other chronic pain: Secondary | ICD-10-CM

## 2023-11-27 MED ORDER — PREDNISONE 10 MG PO TABS
ORAL_TABLET | ORAL | 0 refills | Status: DC
Start: 2023-11-27 — End: 2023-12-13

## 2023-11-27 NOTE — Patient Instructions (Signed)
Shoulder Exercises Ask your health care provider which exercises are safe for you. Do exercises exactly as told by your health care provider and adjust them as directed. It is normal to feel mild stretching, pulling, tightness, or discomfort as you do these exercises. Stop right away if you feel sudden pain or your pain gets worse. Do not begin these exercises until told by your health care provider. Stretching exercises External rotation and abduction This exercise is sometimes called corner stretch. The exercise rotates your arm outward (external rotation) and moves your arm out from your body (abduction). Stand in a doorway with one of your feet slightly in front of the other. This is called a staggered stance. If you cannot reach your forearms to the door frame, stand facing a corner of a room. Choose one of the following positions as told by your health care provider: Place your hands and forearms on the door frame above your head. Place your hands and forearms on the door frame at the height of your head. Place your hands on the door frame at the height of your elbows. Slowly move your weight onto your front foot until you feel a stretch across your chest and in the front of your shoulders. Keep your head and chest upright and keep your abdominal muscles tight. Hold for __________ seconds. To release the stretch, shift your weight to your back foot. Repeat __________ times. Complete this exercise __________ times a day. Extension, standing  Stand and hold a broomstick, a cane, or a similar object behind your back. Your hands should be a little wider than shoulder-width apart. Your palms should face away from your back. Keeping your elbows straight and your shoulder muscles relaxed, move the stick away from your body until you feel a stretch in your shoulders (extension). Avoid shrugging your shoulders while you move the stick. Keep your shoulder blades tucked down toward the middle of your  back. Hold for __________ seconds. Slowly return to the starting position. Repeat __________ times. Complete this exercise __________ times a day. Range-of-motion exercises Pendulum  Stand near a wall or a surface that you can hold onto for balance. Bend at the waist and let your left / right arm hang straight down. Use your other arm to support you. Keep your back straight and do not lock your knees. Relax your left / right arm and shoulder muscles, and move your hips and your trunk so your left / right arm swings freely. Your arm should swing because of the motion of your body, not because you are using your arm or shoulder muscles. Keep moving your hips and trunk so your arm swings in the following directions, as told by your health care provider: Side to side. Forward and backward. In clockwise and counterclockwise circles. Continue each motion for __________ seconds, or for as long as told by your health care provider. Slowly return to the starting position. Repeat __________ times. Complete this exercise __________ times a day. Shoulder flexion, standing  Stand and hold a broomstick, a cane, or a similar object. Place your hands a little more than shoulder-width apart on the object. Your left / right hand should be palm-up, and your other hand should be palm-down. Keep your elbow straight and your shoulder muscles relaxed. Push the stick up with your healthy arm to raise your left / right arm in front of your body, and then over your head until you feel a stretch in your shoulder (flexion). Avoid shrugging your shoulder  while you raise your arm. Keep your shoulder blade tucked down toward the middle of your back. Hold for __________ seconds. Slowly return to the starting position. Repeat __________ times. Complete this exercise __________ times a day. Shoulder abduction, standing  Stand and hold a broomstick, a cane, or a similar object. Place your hands a little more than  shoulder-width apart on the object. Your left / right hand should be palm-up, and your other hand should be palm-down. Keep your elbow straight and your shoulder muscles relaxed. Push the object across your body toward your left / right side. Raise your left / right arm to the side of your body (abduction) until you feel a stretch in your shoulder. Do not raise your arm above shoulder height unless your health care provider tells you to do that. If directed, raise your arm over your head. Avoid shrugging your shoulder while you raise your arm. Keep your shoulder blade tucked down toward the middle of your back. Hold for __________ seconds. Slowly return to the starting position. Repeat __________ times. Complete this exercise __________ times a day. Internal rotation  Place your left / right hand behind your back, palm-up. Use your other hand to dangle an exercise band, a broomstick, or a similar object over your shoulder. Grasp the band with your left / right hand so you are holding on to both ends. Gently pull up on the band until you feel a stretch in the front of your left / right shoulder. The movement of your arm toward the center of your body is called internal rotation. Avoid shrugging your shoulder while you raise your arm. Keep your shoulder blade tucked down toward the middle of your back. Hold for __________ seconds. Release the stretch by letting go of the band and lowering your hands. Repeat __________ times. Complete this exercise __________ times a day. Strengthening exercises External rotation  Sit in a stable chair without armrests. Secure an exercise band to a stable object at elbow height on your left / right side. Place a soft object, such as a folded towel or a small pillow, between your left / right upper arm and your body to move your elbow about 4 inches (10 cm) away from your side. Hold the end of the exercise band so it is tight and there is no slack. Keeping your  elbow pressed against the soft object, slowly move your forearm out, away from your abdomen (external rotation). Keep your body steady so only your forearm moves. Hold for __________ seconds. Slowly return to the starting position. Repeat __________ times. Complete this exercise __________ times a day. Shoulder abduction  Sit in a stable chair without armrests, or stand up. Hold a __________ lb / kg weight in your left / right hand, or hold an exercise band with both hands. Start with your arms straight down and your left / right palm facing in, toward your body. Slowly lift your left / right hand out to your side (abduction). Do not lift your hand above shoulder height unless your health care provider tells you that this is safe. Keep your arms straight. Avoid shrugging your shoulder while you do this movement. Keep your shoulder blade tucked down toward the middle of your back. Hold for __________ seconds. Slowly lower your arm, and return to the starting position. Repeat __________ times. Complete this exercise __________ times a day. Shoulder extension  Sit in a stable chair without armrests, or stand up. Secure an exercise band to a  stable object in front of you so it is at shoulder height. Hold one end of the exercise band in each hand. Straighten your elbows and lift your hands up to shoulder height. Squeeze your shoulder blades together as you pull your hands down to the sides of your thighs (extension). Stop when your hands are straight down by your sides. Do not let your hands go behind your body. Hold for __________ seconds. Slowly return to the starting position. Repeat __________ times. Complete this exercise __________ times a day. Shoulder row  Sit in a stable chair without armrests, or stand up. Secure an exercise band to a stable object in front of you so it is at chest height. Hold one end of the exercise band in each hand. Position your palms so that your thumbs are  facing the ceiling (neutral position). Bend each of your elbows to a 90-degree angle (right angle) and keep your upper arms at your sides. Step back or move the chair back until the band is tight and there is no slack. Slowly pull your elbows back behind you. Hold for __________ seconds. Slowly return to the starting position. Repeat __________ times. Complete this exercise __________ times a day. Shoulder press-ups  Sit in a stable chair that has armrests. Sit upright, with your feet flat on the floor. Put your hands on the armrests so your elbows are bent and your fingers are pointing forward. Your hands should be about even with the sides of your body. Push down on the armrests and use your arms to lift yourself off the chair. Straighten your elbows and lift yourself up as much as you comfortably can. Move your shoulder blades down, and avoid letting your shoulders move up toward your ears. Keep your feet on the ground. As you get stronger, your feet should support less of your body weight as you lift yourself up. Hold for __________ seconds. Slowly lower yourself back into the chair. Repeat __________ times. Complete this exercise __________ times a day. Wall push-ups  Stand so you are facing a stable wall. Your feet should be about one arm-length away from the wall. Lean forward and place your palms on the wall at shoulder height. Keep your feet flat on the floor as you bend your elbows and lean forward toward the wall. Hold for __________ seconds. Straighten your elbows to push yourself back to the starting position. Repeat __________ times. Complete this exercise __________ times a day. This information is not intended to replace advice given to you by your health care provider. Make sure you discuss any questions you have with your health care provider. Document Revised: 11/22/2021 Document Reviewed: 11/22/2021 Elsevier Patient Education  2024 [employer].

## 2023-11-27 NOTE — Progress Notes (Signed)
Subjective:    Patient ID: Jacqueline Yoder, female    DOB: 1946/09/01, 78 y.o.   MRN: [id number]  HPI Discussed the use of AI scribe software for clinical note transcription with the patient, who gave verbal consent to proceed.  Jacqueline Yoder is a 78 year old female who presents with right shoulder pain and tingling in the fingers.  She has been experiencing right shoulder pain intermittently for the past couple of months, with a recent exacerbation in the last few days. The pain is described as sore and tender, especially when touched in specific areas, and is associated with difficulty lifting the arm. There is no history of recent trauma, falls, or shoulder surgery. She has been using Tylenol Arthritis and Icy Hot for relief.  She also experiences tingling in her fingers, which has become more constant since the onset of shoulder pain. Previously, the tingling occurred with cold exposure but now extends down the right side, including the neck and hand. She also mentions right-sided neck pain, initially thought to be due to her sleeping position. No vision changes, dizziness, or other aches are associated with these symptoms.  Regarding her sleep habits, she usually sleeps on her back and occasionally turns to her right side but does not remain in that position.        Review of Systems   Past Medical History:  Diagnosis Date   Allergy    Arthritis    Blood clot in vein    Family history of colon cancer    GERD (gastroesophageal reflux disease)    Hypercholesteremia    Hypertension    Hypertension    Lower extremity edema    Personal history of chemotherapy    Rectal cancer (HCC) 12/2018   Urinary incontinence     Current Outpatient Medications  Medication Sig Dispense Refill   acetaminophen (TYLENOL) 650 MG CR tablet Take 650 mg by mouth every 8 (eight) hours as needed for pain.     benzonatate (TESSALON) 100 MG capsule Take 1 capsule (100 mg total) by mouth 3 (three)  times daily as needed for cough. 30 capsule 0   Cholecalciferol (VITAMIN D3) 2000 units capsule Take 2,000 Units by mouth daily.     clindamycin (CLINDAGEL) 1 % gel Apply topically 2 (two) times daily. 30 g 5   diclofenac sodium (VOLTAREN) 1 % GEL Apply 2 g topically 4 (four) times daily as needed (joint pain).  11   diphenoxylate-atropine (LOMOTIL) 2.5-0.025 MG tablet Take 1 tablet by mouth 4 (four) times daily as needed for diarrhea or loose stools. 120 tablet 0   ELIQUIS 2.5 MG TABS tablet TAKE 1 TABLET BY MOUTH TWICE  DAILY 200 tablet 2   fluticasone (FLONASE) 50 MCG/ACT nasal spray Place 1 spray into both nostrils daily as needed for allergies or rhinitis.     gentamicin cream (GARAMYCIN) 0.1 % Apply to affected toe once daily. 30 g 1   ipratropium (ATROVENT) 0.03 % nasal spray USE 1 SPRAY IN BOTH NOSTRILS  TWICE DAILY (Patient taking differently: Place 1 spray into both nostrils 2 (two) times daily as needed for rhinitis.) 60 mL 2   ketoconazole (NIZORAL) 2 % cream Apply 1 Application topically daily. For up to 2 weeks. May repeat if need. 30 g 2   lidocaine-prilocaine (EMLA) cream Apply 1 application. topically as needed. 30 g 6   loratadine (CLARITIN) 10 MG tablet Take 10 mg by mouth daily.     magnesium chloride (SLOW-MAG)  64 MG TBEC SR tablet Take 1 tablet (64 mg total) by mouth 2 (two) times daily. 60 tablet 2   Multiple Vitamins-Minerals (ONE-A-DAY WOMENS 50 PLUS PO) Take 1 tablet by mouth daily.      nystatin (MYCOSTATIN/NYSTOP) powder APPLY 1 POWDER TOPICALLY THREE TIMES DAILY 45 g 0   potassium chloride SA (KLOR-CON M) 20 MEQ tablet TAKE 1 TABLET BY MOUTH DAILY 100 tablet 2   simvastatin (ZOCOR) 40 MG tablet Take 1 tablet (40 mg total) by mouth at bedtime. 100 tablet 3   triamcinolone cream (KENALOG) 0.5 % Apply 1 Application topically 2 (two) times daily. To affected areas, for up to 2 weeks. 30 g 2   zinc gluconate 50 MG tablet Take 50 mg by mouth daily.     No current  facility-administered medications for this visit.   Facility-Administered Medications Ordered in Other Visits  Medication Dose Route Frequency Provider Last Rate Last Admin   heparin lock flush 100 UNIT/ML injection             Allergies  Allergen Reactions   Sulfamethoxazole-Trimethoprim Other (See Comments)    Also said she had chills and pressure in nose.    Family History  Problem Relation Age of Onset   Colon cancer Brother 15       exposure to chemicals [place]   Hypertension Mother    Stroke Mother    Kidney failure Father    Breast cancer Neg Hx    Ovarian cancer Neg Hx     Social History   Socioeconomic History   Marital status: Widowed    Spouse name: Not on file   Number of children: Not on file   Years of education: Not on file   Highest education level: Some college, no degree  Occupational History   Not on file  Tobacco Use   Smoking status: Former    Current packs/day: 0.00    Types: Cigarettes    Quit date: 12/02/1977    Years since quitting: 46.0   Smokeless tobacco: Former  Building services engineer status: Never Used  Substance and Sexual Activity   Alcohol use: Never   Drug use: Never   Sexual activity: Not Currently    Birth control/protection: None  Other Topics Concern   Not on file  Social History Narrative   Lives with daughter   Social Drivers of Health   Financial Resource Strain: Low Risk  (11/15/2023)   Overall Financial Resource Strain (CARDIA)    Difficulty of Paying Living Expenses: Not hard at all  Food Insecurity: No Food Insecurity (11/15/2023)   Hunger Vital Sign    Worried About Running Out of Food in the Last Year: Never true    Ran Out of Food in the Last Year: Never true  Transportation Needs: No Transportation Needs (11/15/2023)   PRAPARE - Administrator, Civil Service (Medical): No    Lack of Transportation (Non-Medical): No  Physical Activity: Insufficiently Active (11/15/2023)   Exercise Vital Sign     Days of Exercise per Week: 3 days    Minutes of Exercise per Session: 10 min  Stress: No Stress Concern Present (11/15/2023)   [employer] of Occupational Health - Occupational Stress Questionnaire    Feeling of Stress : Not at all  Social Connections: Moderately Integrated (11/15/2023)   Social Connection and Isolation Panel [NHANES]    Frequency of Communication with Friends and Family: More than three times a week  Frequency of Social Gatherings with Friends and Family: More than three times a week    Attends Religious Services: More than 4 times per year    Active Member of Clubs or Organizations: Yes    Attends Banker Meetings: More than 4 times per year    Marital Status: Widowed  Intimate Partner Violence: Not At Risk (09/27/2023)   Humiliation, Afraid, Rape, and Kick questionnaire    Fear of Current or Ex-Partner: No    Emotionally Abused: No    Physically Abused: No    Sexually Abused: No     Constitutional: Denies fever, malaise, fatigue, headache or abrupt weight changes.  Respiratory: Denies difficulty breathing, shortness of breath, cough or sputum production.   Cardiovascular: Denies chest pain, chest tightness, palpitations or swelling in the hands or feet.  Musculoskeletal: Patient reports right side neck pain, shoulder pain and decrease range of motion.  Denies difficulty with gait, muscle pain or joint swelling.  Skin: Denies redness, rashes, lesions or ulcercations.  Neurological: Pt reports neuropathic pain of right hand. Denies dizziness, difficulty with memory, difficulty with speech or problems with balance and coordination.    No other specific complaints in a complete review of systems (except as listed in HPI above).      Objective:   Physical Exam  BP (!) 144/78 (BP Location: Left Arm, Patient Position: Sitting, Cuff Size: Normal)   Ht 5\' 4"  (1.626 m)   Wt 168 lb (76.2 kg)   BMI 28.84 kg/m   Wt Readings from Last 3 Encounters:   11/26/23 170 lb 12.8 oz (77.5 kg)  11/15/23 171 lb 3.2 oz (77.7 kg)  11/13/23 169 lb (76.7 kg)    General: Appears her stated age, overweight, in NAD. Cardiovascular: Normal rate and rhythm.  Pedal pulse 2+ on the right. Pulmonary/Chest: Normal effort and positive vesicular breath sounds. No respiratory distress. No wheezes, rales or ronchi noted.  Musculoskeletal: Normal flexion and lateral bending of the cervical spine.  Decreased extension and rotation of the cervical spine.  No bony tenderness noted over the cervical spine.  No pain with palpation of the right paracervical muscles.  Shoulder shrug equal.  Decreased internal and external rotation of the right shoulder.  She is unable to lift her arm up to even attempt a drop can test.  Strength 4/5 RUE, 5/5 LUE.  Handgrips equal.  No difficulty with gait.  Neurological: Alert and oriented. Coordination normal of the fingers of the right hand.Marland Kitchen    BMET    Component Value Date/Time   NA 139 11/13/2023 0800   K 3.9 11/13/2023 0800   CL 105 11/13/2023 0800   CO2 26 11/13/2023 0800   GLUCOSE 110 (H) 11/13/2023 0800   BUN 13 11/13/2023 0800   CREATININE 0.72 11/13/2023 0800   CREATININE 1.10 (H) 06/24/2021 0802   CALCIUM 9.2 11/13/2023 0800   GFRNONAA >60 11/13/2023 0800   GFRAA >60 07/16/2020 0802    Lipid Panel     Component Value Date/Time   CHOL 166 07/04/2023 0845   TRIG 55 07/04/2023 0845   HDL 70 07/04/2023 0845   CHOLHDL 2.4 07/04/2023 0845   LDLCALC 82 07/04/2023 0845    CBC    Component Value Date/Time   WBC 2.8 (L) 11/13/2023 0800   WBC 3.3 (L) 09/20/2023 0426   RBC 3.53 (L) 11/13/2023 0800   HGB 10.3 (L) 11/13/2023 0800   HCT 32.9 (L) 11/13/2023 0800   PLT 176 11/13/2023 0800  MCV 93.2 11/13/2023 0800   MCH 29.2 11/13/2023 0800   MCHC 31.3 11/13/2023 0800   RDW 18.6 (H) 11/13/2023 0800   LYMPHSABS 1.1 11/13/2023 0800   MONOABS 0.5 11/13/2023 0800   EOSABS 0.1 11/13/2023 0800   BASOSABS 0.0 11/13/2023  0800    Hgb A1C Lab Results  Component Value Date   HGBA1C 5.1 07/04/2023            Assessment & Plan:  Assessment and Plan    Right Shoulder Pain, Right Side Neck Pain Likely bursitis and tendonitis, possible rotator cuff involvement exacerbated in the past few days. No history of trauma or surgery. Pain radiates down the arm with associated tingling in the fingers. -Start Prednisone taper:3mg  for 3 days, 2mg  for 3 days, 1mg  for 3 days. -Continue Tylenol Arthritis as needed for pain. -Consider topical analgesics such as Biofreeze or Tiger Balm. -Encourage gentle stretching and avoid repetitive movements. -If no improvement, consider referral to Dr. Althea Charon for possible shoulder injection.   Follow-up with your PCP as previously scheduled Nicki Reaper, NP

## 2023-11-28 ENCOUNTER — Ambulatory Visit
Admission: RE | Admit: 2023-11-28 | Discharge: 2023-11-28 | Disposition: A | Discharge status: Home / Self Care | Payer: 59 | Source: Ambulatory Visit | Attending: Oncology | Admitting: Oncology

## 2023-11-28 ENCOUNTER — Ambulatory Visit: Payer: 59 | Admitting: Family Medicine

## 2023-11-28 DIAGNOSIS — R590 Localized enlarged lymph nodes: Secondary | ICD-10-CM | POA: Diagnosis not present

## 2023-11-28 DIAGNOSIS — C20 Malignant neoplasm of rectum: Secondary | ICD-10-CM | POA: Insufficient documentation

## 2023-11-28 DIAGNOSIS — K769 Liver disease, unspecified: Secondary | ICD-10-CM | POA: Diagnosis not present

## 2023-11-28 DIAGNOSIS — D181 Lymphangioma, any site: Secondary | ICD-10-CM | POA: Diagnosis not present

## 2023-11-28 MED ORDER — IOHEXOL 300 MG/ML  SOLN
85.0000 mL | Freq: Once | INTRAMUSCULAR | Status: AC | PRN
  Administered 2023-11-28: 85 mL via INTRAVENOUS

## 2023-11-28 MED ORDER — BARIUM SULFATE 2 % PO SUSP
450.0000 mL | ORAL | Status: AC
  Administered 2023-11-28 (×2): 450 mL via ORAL

## 2023-11-29 ENCOUNTER — Inpatient Hospital Stay: Admission: RE | Admit: 2023-11-29 | Payer: 59 | Source: Ambulatory Visit

## 2023-11-30 ENCOUNTER — Ambulatory Visit: Payer: Self-pay

## 2023-11-30 NOTE — Telephone Encounter (Signed)
Spoke with patient she will resume taking BP medication and keep her appointment next week

## 2023-11-30 NOTE — Telephone Encounter (Signed)
Yes we agreed to discontinue Triamterene-hydrochlorothiazide after recent hospital stay. I saw her in December 2024. She was doing well off of BP med. So we kept it off her list.  She can resume if high BP now and follow up next week. This is not urgent, especially if she has some medicine to take. We can discuss options at a visit.  Saralyn Pilar, DO [facility] 11/30/2023, 2:40 PM

## 2023-11-30 NOTE — Telephone Encounter (Signed)
  Chief Complaint: Elevated BP readings Symptoms: None Frequency: today Pertinent Negatives: Patient denies any s/s Disposition: [] ED /[] Urgent Care (no appt availability in office) / [x] Appointment(In office/virtual)/ []  [facility]/ [] Home Care/ [] Refused Recommended Disposition /[] [facility]/ [x]  Follow-up with PCP Additional Notes: Pt had a home health visit for a check up. At that appt she was found to have elevated Bps. Pt was on BP medication, but was taken off by Dr. Althea Charon. Pt reports that she is having some pain which may be elevating the BP. Pt made an appt for weds of next week. Pt cannot come in earlier d/t other appts.  Pt is wondering if provider would like for her to resume bp medications before OV.  Additionally pt does not have a BP cuff and so is unable to measure her BP at home.  Please advise.  Summary: bp elevated   Pt called saying her [facility] nurse came by today and her bp was running a little high. 168/72.    They told her to call her provider .    Please advise [phone]     Reason for Disposition  Systolic BP  >= 160 OR Diastolic >= 100  Answer Assessment - Initial Assessment Questions 1. BLOOD PRESSURE: "What is the blood pressure?" "Did you take at least two measurements 5 minutes apart?"     168/72 and 158/74 2. ONSET: "When did you take your blood pressure?"     Home health  3. HOW: "How did you take your blood pressure?" (e.g., automatic home BP monitor, visiting nurse)     Visiting nurse 4. HISTORY: "Do you have a history of high blood pressure?"     yes 5. MEDICINES: "Are you taking any medicines for blood pressure?" "Have you missed any doses recently?"     no 6. OTHER SYMPTOMS: "Do you have any symptoms?" (e.g., blurred vision, chest pain, difficulty breathing, headache, weakness)     pain  Protocols used: Blood Pressure - High-A-AH

## 2023-12-03 ENCOUNTER — Encounter: Payer: Self-pay | Admitting: Oncology

## 2023-12-03 NOTE — Assessment & Plan Note (Signed)
 History of stage IIIC Rectal cancer, s/p TNT, followed by 09/17/19 APR/posterior vaginectomy/TAH/BSO/VY-flap, pT4b pN0 with close vaginal margin 0.2 mm.  Uterus and ovaries negative for malignancy. palliative radiation to vaginal recurrence- 01/19/21 recurrence with lung metastasis.-Palliative -FOLFIRI plus bevacizumab.  Irinotecan was dropped in November 2022 due to side effects. Negative for UGT1A1*28 - radiographically stable, rise of CEA-July 2023 CT lung metastasis worse--> Dec 2023 PET showed progression in pelvic lymph nodes and bone lesions,--> 2nd line irinotecan +panitumumab-->CT March 2024 stable. --> June 2024 CT showed mild lung progression/Progressive left iliac bone metastasis, vulvar mass difficult for biopsy in office,  [facility]us Liquid biopsy - no actionable variants --> 06/25/2023 CT showed progression of lung nodules [SLD increase 35mm] and iliac inguinal lymphadenopathy--> 07/23/23 switch to dose reduced FOLFOX --> Dec 2024 CT- stable disease.   Labs are reviewed and discussed with patient,  CEA nadir was in 490, progressively increases - 6000sShe has tolerated FOLFOX well- grade 1 neuropathy Recommend continue dose reduced FOLFOX . Repeat CT

## 2023-12-04 ENCOUNTER — Inpatient Hospital Stay: Payer: 59

## 2023-12-04 ENCOUNTER — Inpatient Hospital Stay: Payer: 59 | Attending: Oncology

## 2023-12-04 ENCOUNTER — Inpatient Hospital Stay (HOSPITAL_BASED_OUTPATIENT_CLINIC_OR_DEPARTMENT_OTHER): Payer: 59 | Admitting: Oncology

## 2023-12-04 ENCOUNTER — Ambulatory Visit: Payer: 59 | Admitting: Family Medicine

## 2023-12-04 ENCOUNTER — Encounter: Payer: Self-pay | Admitting: Family Medicine

## 2023-12-04 ENCOUNTER — Encounter: Payer: Self-pay | Admitting: Oncology

## 2023-12-04 VITALS — BP 147/75 | HR 96 | Temp 96.4°F | Ht 64.0 in | Wt 161.1 lb

## 2023-12-04 VITALS — BP 134/78 | HR 89 | Ht 64.0 in | Wt 160.0 lb

## 2023-12-04 DIAGNOSIS — E7800 Pure hypercholesterolemia, unspecified: Secondary | ICD-10-CM | POA: Diagnosis not present

## 2023-12-04 DIAGNOSIS — Z923 Personal history of irradiation: Secondary | ICD-10-CM | POA: Insufficient documentation

## 2023-12-04 DIAGNOSIS — Z9221 Personal history of antineoplastic chemotherapy: Secondary | ICD-10-CM | POA: Insufficient documentation

## 2023-12-04 DIAGNOSIS — N281 Cyst of kidney, acquired: Secondary | ICD-10-CM | POA: Diagnosis not present

## 2023-12-04 DIAGNOSIS — I129 Hypertensive chronic kidney disease with stage 1 through stage 4 chronic kidney disease, or unspecified chronic kidney disease: Secondary | ICD-10-CM | POA: Diagnosis not present

## 2023-12-04 DIAGNOSIS — C7951 Secondary malignant neoplasm of bone: Secondary | ICD-10-CM | POA: Diagnosis not present

## 2023-12-04 DIAGNOSIS — Z5111 Encounter for antineoplastic chemotherapy: Secondary | ICD-10-CM

## 2023-12-04 DIAGNOSIS — K435 Parastomal hernia without obstruction or  gangrene: Secondary | ICD-10-CM | POA: Diagnosis not present

## 2023-12-04 DIAGNOSIS — C20 Malignant neoplasm of rectum: Secondary | ICD-10-CM

## 2023-12-04 DIAGNOSIS — I10 Essential (primary) hypertension: Secondary | ICD-10-CM | POA: Insufficient documentation

## 2023-12-04 DIAGNOSIS — I82501 Chronic embolism and thrombosis of unspecified deep veins of right lower extremity: Secondary | ICD-10-CM | POA: Diagnosis not present

## 2023-12-04 DIAGNOSIS — N182 Chronic kidney disease, stage 2 (mild): Secondary | ICD-10-CM | POA: Diagnosis not present

## 2023-12-04 DIAGNOSIS — J90 Pleural effusion, not elsewhere classified: Secondary | ICD-10-CM | POA: Diagnosis not present

## 2023-12-04 DIAGNOSIS — I2510 Atherosclerotic heart disease of native coronary artery without angina pectoris: Secondary | ICD-10-CM | POA: Diagnosis not present

## 2023-12-04 DIAGNOSIS — C799 Secondary malignant neoplasm of unspecified site: Secondary | ICD-10-CM

## 2023-12-04 DIAGNOSIS — I708 Atherosclerosis of other arteries: Secondary | ICD-10-CM | POA: Insufficient documentation

## 2023-12-04 DIAGNOSIS — T451X5A Adverse effect of antineoplastic and immunosuppressive drugs, initial encounter: Secondary | ICD-10-CM

## 2023-12-04 DIAGNOSIS — K449 Diaphragmatic hernia without obstruction or gangrene: Secondary | ICD-10-CM | POA: Diagnosis not present

## 2023-12-04 DIAGNOSIS — R188 Other ascites: Secondary | ICD-10-CM | POA: Diagnosis not present

## 2023-12-04 DIAGNOSIS — R634 Abnormal weight loss: Secondary | ICD-10-CM | POA: Insufficient documentation

## 2023-12-04 DIAGNOSIS — D6481 Anemia due to antineoplastic chemotherapy: Secondary | ICD-10-CM

## 2023-12-04 DIAGNOSIS — K219 Gastro-esophageal reflux disease without esophagitis: Secondary | ICD-10-CM | POA: Diagnosis not present

## 2023-12-04 DIAGNOSIS — M47814 Spondylosis without myelopathy or radiculopathy, thoracic region: Secondary | ICD-10-CM | POA: Diagnosis not present

## 2023-12-04 DIAGNOSIS — M25511 Pain in right shoulder: Secondary | ICD-10-CM | POA: Insufficient documentation

## 2023-12-04 DIAGNOSIS — K565 Intestinal adhesions [bands], unspecified as to partial versus complete obstruction: Secondary | ICD-10-CM | POA: Diagnosis not present

## 2023-12-04 DIAGNOSIS — Z87891 Personal history of nicotine dependence: Secondary | ICD-10-CM | POA: Insufficient documentation

## 2023-12-04 DIAGNOSIS — G629 Polyneuropathy, unspecified: Secondary | ICD-10-CM | POA: Insufficient documentation

## 2023-12-04 DIAGNOSIS — Z86711 Personal history of pulmonary embolism: Secondary | ICD-10-CM | POA: Diagnosis not present

## 2023-12-04 DIAGNOSIS — Z7901 Long term (current) use of anticoagulants: Secondary | ICD-10-CM | POA: Insufficient documentation

## 2023-12-04 DIAGNOSIS — Z933 Colostomy status: Secondary | ICD-10-CM | POA: Insufficient documentation

## 2023-12-04 DIAGNOSIS — L209 Atopic dermatitis, unspecified: Secondary | ICD-10-CM | POA: Diagnosis not present

## 2023-12-04 DIAGNOSIS — I825Z3 Chronic embolism and thrombosis of unspecified deep veins of distal lower extremity, bilateral: Secondary | ICD-10-CM

## 2023-12-04 DIAGNOSIS — Z79899 Other long term (current) drug therapy: Secondary | ICD-10-CM | POA: Insufficient documentation

## 2023-12-04 LAB — CMP (CANCER CENTER ONLY)
ALT: 24 U/L (ref 0–44)
AST: 46 U/L — ABNORMAL HIGH (ref 15–41)
Albumin: 3.7 g/dL (ref 3.5–5.0)
Alkaline Phosphatase: 78 U/L (ref 38–126)
Anion gap: 9 (ref 5–15)
BUN: 18 mg/dL (ref 8–23)
CO2: 28 mmol/L (ref 22–32)
Calcium: 9.4 mg/dL (ref 8.9–10.3)
Chloride: 99 mmol/L (ref 98–111)
Creatinine: 0.8 mg/dL (ref 0.44–1.00)
GFR, Estimated: >60 mL/min (ref >60)
Glucose, Bld: 112 mg/dL — ABNORMAL HIGH (ref 70–99)
Potassium: 4 mmol/L (ref 3.5–5.1)
Sodium: 136 mmol/L (ref 135–145)
Total Bilirubin: 0.6 mg/dL (ref 0.0–1.2)
Total Protein: 7.2 g/dL (ref 6.5–8.1)

## 2023-12-04 LAB — CBC WITH DIFFERENTIAL (CANCER CENTER ONLY)
Abs Immature Granulocytes: 0.02 10*3/uL (ref 0.00–0.07)
Basophils Absolute: 0 10*3/uL (ref 0.0–0.1)
Basophils Relative: 0 %
Eosinophils Absolute: 0.1 10*3/uL (ref 0.0–0.5)
Eosinophils Relative: 1 %
HCT: 37.3 % (ref 36.0–46.0)
Hemoglobin: 11.7 g/dL — ABNORMAL LOW (ref 12.0–15.0)
Immature Granulocytes: 0 %
Lymphocytes Relative: 26 %
Lymphs Abs: 1.4 10*3/uL (ref 0.7–4.0)
MCH: 30 pg (ref 26.0–34.0)
MCHC: 31.4 g/dL (ref 30.0–36.0)
MCV: 95.6 fL (ref 80.0–100.0)
Monocytes Absolute: 1.2 10*3/uL — ABNORMAL HIGH (ref 0.1–1.0)
Monocytes Relative: 21 %
Neutro Abs: 2.8 10*3/uL (ref 1.7–7.7)
Neutrophils Relative %: 52 %
Platelet Count: 186 10*3/uL (ref 150–400)
RBC: 3.9 MIL/uL (ref 3.87–5.11)
RDW: 19.5 % — ABNORMAL HIGH (ref 11.5–15.5)
WBC Count: 5.5 10*3/uL (ref 4.0–10.5)
nRBC: 0 % (ref 0.0–0.2)

## 2023-12-04 LAB — MAGNESIUM: Magnesium: 2 mg/dL (ref 1.7–2.4)

## 2023-12-04 MED ORDER — LIDOCAINE-PRILOCAINE 2.5-2.5 % EX CREA: 1.0000 | TOPICAL_CREAM | Freq: Every day | CUTANEOUS | 3 refills | Status: DC | PRN

## 2023-12-04 MED ORDER — HEPARIN SOD (PORK) LOCK FLUSH 100 UNIT/ML IV SOLN
500.0000 [IU] | Freq: Once | INTRAVENOUS | Status: AC
  Administered 2023-12-04: 500 [IU] via INTRAVENOUS
  Filled 2023-12-04: qty 5

## 2023-12-04 MED ORDER — LIDOCAINE-PRILOCAINE 2.5-2.5 % EX CREA: 1.0000 | TOPICAL_CREAM | CUTANEOUS | 3 refills | Status: DC | PRN

## 2023-12-04 MED ORDER — TRIAMCINOLONE ACETONIDE 0.5 % EX CREA: 1.0000 | TOPICAL_CREAM | Freq: Two times a day (BID) | CUTANEOUS | 1 refills | Status: DC

## 2023-12-04 MED ORDER — TRIAMTERENE-HCTZ 37.5-25 MG PO CAPS: 1.0000 | ORAL_CAPSULE | Freq: Every day | ORAL | 1 refills | Status: DC

## 2023-12-04 MED ORDER — TRIFLURIDINE-TIPIRACIL 20-8.19 MG PO TABS: 35.0000 mg/m2 | ORAL_TABLET | Freq: Two times a day (BID) | ORAL | Status: DC

## 2023-12-04 NOTE — Assessment & Plan Note (Signed)
 Chemotherapy plan as listed above

## 2023-12-04 NOTE — Progress Notes (Signed)
DISCONTINUE OFF PATHWAY REGIMEN - Colorectal   [id number] (Leucovorin IV D1 + Fluorouracil IV D1/CIV D1,2 + Oxaliplatin IV D1) q14 Days:   A cycle is every 14 days:     Oxaliplatin      Leucovorin      Fluorouracil      Fluorouracil   **Always confirm dose/schedule in your pharmacy ordering system**  PRIOR TREATMENT: Off Pathway: [id number] (Leucovorin IV D1 + Fluorouracil IV D1/CIV D1,2 + Oxaliplatin IV D1) q14 Days  START ON PATHWAY REGIMEN - Colorectal     A cycle is every 28 days:     Trifluridine and tipiracil      Bevacizumab-xxxx   **Always confirm dose/schedule in your pharmacy ordering system**  Patient Characteristics: Distant Metastases, Nonsurgical Candidate, KRAS/NRAS Wild-Type (BRAF V600 Wild-Type/Unknown), Standard Cytotoxic Therapy, Fourth Line and Beyond Standard Cytotoxic Therapy Tumor Location: Rectal Therapeutic Status: Distant Metastases Microsatellite/Mismatch Repair Status: MSS/pMMR BRAF Mutation Status: Wild-Type (no mutation) KRAS/NRAS Mutation Status: Wild-Type (no mutation) Preferred Therapy Approach: Standard Cytotoxic Therapy Standard Cytotoxic Line of Therapy: Fourth Line and Beyond Standard Cytotoxic Therapy Intent of Therapy: Non-Curative / Palliative Intent, Discussed with Patient

## 2023-12-04 NOTE — Progress Notes (Signed)
Subjective:    Patient ID: Jacqueline Yoder, female    DOB: 1946/08/07, 78 y.o.   MRN: [id number]  Jacqueline Yoder is a 78 y.o. female presenting on 12/04/2023 for Hypertension   HPI  Discussed the use of AI scribe software for clinical note transcription with the patient, who gave verbal consent to proceed.  History of Present Illness   Jacqueline Yoder is a 78 year old female with hypertension and cancer who presents with elevated blood pressure and concerns about cancer treatment.  She has elevated blood pressure, with a reading of 147/75 earlier today, which is an improvement from 168/72 recorded four days ago. Her blood pressure has been fluctuating, with a reading of 134/78 today. She restarted her blood pressure medication, triamterene-hydrochlorothiazide, after experiencing leg swelling, which has since improved. Increased urination after resuming the medication indicates fluid reduction. - Note med was discontinued by hospital and had controlled BP. Now having elevations and swelling again, so has done well back on med.  Metastatic Adenocarcinoma Rectal Followed by [facility] Oncology Dr Cathie Hoops Recent visit updates showing progression in the lungs and pelvis. Chemotherapy was stopped as it was deemed ineffective, and there is consideration for starting a pill form of treatment. Despite the progression, she remains optimistic and reports not experiencing typical cancer symptoms.  She experiences burning sensations from bumps on her skin, which she manages with creams and powders. She has used nystatin powder and lidocaine cream, but the burning persists. The bumps have increased in size and are located in a skin fold area, which is difficult to keep dry. She uses absorbent pads to manage moisture. She is currently on a steroid regimen for inflammation in her arm, which she is completing today and tomorrow, but has not noticed any impact on the burning sensation.         09/26/2023    1:59 PM  07/06/2023   10:35 AM 06/11/2023    2:47 PM  Depression screen PHQ 2/9  Decreased Interest 0 0 0  Down, Depressed, Hopeless 0 0 0  PHQ - 2 Score 0 0 0  Altered sleeping  0 0  Tired, decreased energy  0 0  Change in appetite  0 0  Feeling bad or failure about yourself   0 0  Trouble concentrating  0 0  Moving slowly or fidgety/restless  0 0  Suicidal thoughts  0 0  PHQ-9 Score  0 0  Difficult doing work/chores  Not difficult at all Not difficult at all       09/26/2023    1:59 PM 06/11/2023    2:48 PM 01/03/2023    9:25 AM 01/02/2022    9:48 AM  GAD 7 : Generalized Anxiety Score  Nervous, Anxious, on Edge 0 0 0 0  Control/stop worrying 0 0 0 0  Worry too much - different things 0 0 0 0  Trouble relaxing 0 0 0 0  Restless 0 0 0 0  Easily annoyed or irritable 0 0 0 0  Afraid - awful might happen 0 0 0 0  Total GAD 7 Score 0 0 0 0  Anxiety Difficulty  Not difficult at all Not difficult at all Not difficult at all    Social History   Tobacco Use   Smoking status: Former    Current packs/day: 0.00    Types: Cigarettes    Quit date: 12/02/1977    Years since quitting: 46.0   Smokeless tobacco: Former  [employer]vertising account [employer]  Vaping status: Never Used  Substance Use Topics   Alcohol use: Never   Drug use: Never    Review of Systems Per HPI unless specifically indicated above     Objective:    BP 134/78   Pulse 89   Ht 5\' 4"  (1.626 m)   Wt 160 lb (72.6 kg)   SpO2 98%   BMI 27.46 kg/m   Wt Readings from Last 3 Encounters:  12/04/23 160 lb (72.6 kg)  12/04/23 161 lb 1.6 oz (73.1 kg)  11/27/23 168 lb (76.2 kg)    Physical Exam Vitals and nursing note reviewed.  Constitutional:      General: She is not in acute distress.    Appearance: She is well-developed. She is not diaphoretic.     Comments: Well-appearing, comfortable, cooperative  HENT:     Head: Normocephalic and atraumatic.  Eyes:     General:        Right eye: No discharge.        Left eye: No discharge.      Conjunctiva/sclera: Conjunctivae normal.  Neck:     Thyroid: No thyromegaly.     Vascular: No carotid bruit.  Cardiovascular:     Rate and Rhythm: Normal rate and regular rhythm.     Heart sounds: Normal heart sounds. No murmur heard. Pulmonary:     Effort: Pulmonary effort is normal. No respiratory distress.     Breath sounds: No wheezing or rales.  Abdominal:     Comments: Colostomy bag in place  Musculoskeletal:        General: Normal range of motion.     Cervical back: Normal range of motion and neck supple.     Right lower leg: No edema.     Left lower leg: No edema.  Lymphadenopathy:     Cervical: No cervical adenopathy.  Skin:    General: Skin is warm and dry.     Findings: Lesion (R lower abdomen with large skin fold overlapping and undeath has fibrotic scar tissue ulcerated skin.) present. No erythema or rash.  Neurological:     Mental Status: She is alert and oriented to person, place, and time.  Psychiatric:        Behavior: Behavior normal.     Comments: Well groomed, good eye contact, normal speech and thoughts     Results for orders placed or performed in visit on 12/04/23  CMP (Cancer Center only)   Collection Time: 12/04/23  8:14 AM  Result Value Ref Range   Sodium 136 135 - 145 mmol/L   Potassium 4.0 3.5 - 5.1 mmol/L   Chloride 99 98 - 111 mmol/L   CO2 28 22 - 32 mmol/L   Glucose, Bld 112 (H) 70 - 99 mg/dL   BUN 18 8 - 23 mg/dL   Creatinine 4.09 8.11 - 1.00 mg/dL   Calcium 9.4 8.9 - 91.4 mg/dL   Total Protein 7.2 6.5 - 8.1 g/dL   Albumin 3.7 3.5 - 5.0 g/dL   AST 46 (H) 15 - 41 U/L   ALT 24 0 - 44 U/L   Alkaline Phosphatase 78 38 - 126 U/L   Total Bilirubin 0.6 0.0 - 1.2 mg/dL   GFR, Estimated >78 >29 mL/min   Anion gap 9 5 - 15  CBC with Differential (Cancer Center Only)   Collection Time: 12/04/23  8:14 AM  Result Value Ref Range   WBC Count 5.5 4.0 - 10.5 K/uL   RBC 3.90 3.87 - 5.11  MIL/uL   Hemoglobin 11.7 (L) 12.0 - 15.0 g/dL   HCT 16.1  09.6 - 04.5 %   MCV 95.6 80.0 - 100.0 fL   MCH 30.0 26.0 - 34.0 pg   MCHC 31.4 30.0 - 36.0 g/dL   RDW 40.9 (H) 81.1 - 91.4 %   Platelet Count 186 150 - 400 K/uL   nRBC 0.0 0.0 - 0.2 %   Neutrophils Relative % 52 %   Neutro Abs 2.8 1.7 - 7.7 K/uL   Lymphocytes Relative 26 %   Lymphs Abs 1.4 0.7 - 4.0 K/uL   Monocytes Relative 21 %   Monocytes Absolute 1.2 (H) 0.1 - 1.0 K/uL   Eosinophils Relative 1 %   Eosinophils Absolute 0.1 0.0 - 0.5 K/uL   Basophils Relative 0 %   Basophils Absolute 0.0 0.0 - 0.1 K/uL   Immature Granulocytes 0 %   Abs Immature Granulocytes 0.02 0.00 - 0.07 K/uL  Magnesium   Collection Time: 12/04/23  8:14 AM  Result Value Ref Range   Magnesium 2.0 1.7 - 2.4 mg/dL   *Note: Due to a large number of results and/or encounters for the requested time period, some results have not been displayed. A complete set of results can be found in Results Review.      Assessment & Plan:   Problem List Items Addressed This Visit     Benign hypertension with CKD (chronic kidney disease), stage II - Primary   Relevant Medications   triamterene-hydrochlorothiazide (DYAZIDE) 37.5-25 MG capsule   Other Visit Diagnoses       Atopic dermatitis, unspecified type       Relevant Medications   triamcinolone cream (KENALOG) 0.5 %        Metastatic Rectal Adenocarcinoma Followed by [facility] Oncology Dr Cathie Hoops Recent update with progression of cancer in the lungs and pelvis. Chemotherapy discontinued due to lack of efficacy. Patient maintains a positive attitude and strong religous faith. Plan to initiate oral chemotherapy if approved by insurance. -Continue to monitor response to new treatment plan.  Hypertension Recent elevated blood pressure readings, now improved. Prevously taken off BP med in hospital. Now has had elevated BP readings and lower ext swelling since coming off med. Recently restarted with success -Continue Triamterene-hydrochlorothiazide 37.5-25mg , one capsule  daily. -Recheck blood pressure on 01/08/2024.  Skin Ulceration Chronic skin ulceration in the skin fold causing significant burning sensation. Current management with Triamcinolone cream and Lidocaine provides partial relief. -Renew prescription for Triamcinolone cream. -Continue use of Lidocaine for symptomatic relief. -Continue use of absorbent pads to keep the area dry and clean.        No orders of the defined types were placed in this encounter.   Meds ordered this encounter  Medications   triamterene-hydrochlorothiazide (DYAZIDE) 37.5-25 MG capsule    Sig: Take 1 each (1 capsule total) by mouth daily.    Dispense:  90 capsule    Refill:  1   triamcinolone cream (KENALOG) 0.5 %    Sig: Apply 1 Application topically 2 (two) times daily.    Dispense:  90 g    Refill:  1    Follow up plan: Return if symptoms worsen or fail to improve.   Saralyn Pilar, DO [facility] 12/04/2023, 3:20 PM

## 2023-12-04 NOTE — Progress Notes (Signed)
Hematology/Oncology Progress note [phone] [phone]      CHIEF COMPLAINTS/REASON FOR VISIT:  Follow up for rectal cancer  ASSESSMENT & PLAN:   Cancer Staging  Rectal cancer [facility]) Staging form: Colon and Rectum, AJCC 8th Edition - Pathologic stage from 10/06/2019: Stage IIC (ypT4b, pN0, cM0) - Signed by [name], MD on 10/06/2019 - Clinical stage from 06/30/2020: [name] - Signed by [name], MD on 04/17/2022 - Pathologic: Stage Unknown (rpTX, pNX, cM1) - Signed by [name], MD on 01/31/2021   Rectal cancer [facility]) History of stage IIIC Rectal cancer, s/p TNT, followed by 09/17/19 APR/posterior vaginectomy/TAH/BSO/VY-flap, pT4b pN0 with close vaginal margin 0.2 mm.  Uterus and ovaries negative for malignancy. palliative radiation to vaginal recurrence- 01/19/21 recurrence with lung metastasis.-Palliative -FOLFIRI plus bevacizumab.  Irinotecan was dropped in November 2022 due to side effects. Negative for UGT1A1*28 - radiographically stable, rise of CEA-July 2023 CT lung metastasis worse--> Dec 2023 PET showed progression in pelvic lymph nodes and bone lesions,--> 2nd line irinotecan +panitumumab-->CT March 2024 stable. --> June 2024 CT showed mild lung progression/Progressive left iliac bone metastasis, vulvar mass difficult for biopsy in office,  Tempus Liquid biopsy - no actionable variants --> 06/25/2023 CT showed progression of lung nodules [SLD increase 48mm] and iliac inguinal lymphadenopathy--> 07/23/23 switch to dose reduced FOLFOX --> Dec 2024 CT- stable disease. --> Feb 2025 disease progression  Labs are reviewed and discussed with patient   CEA nadir was in 490, progressively increases - 7000s CT images and results were reviewed with patient.  Disease progression. Recommend to switch to next treatment with longsurf + Bevacizumab, will check insurance approval  Anemia due to antineoplastic chemotherapy Hb stable, observation  Encounter for antineoplastic  chemotherapy Chemotherapy plan as listed above.   History of pulmonary embolism History of PE and bilateral lower extremity DVT  05/18/23 Repeat right LE US showed chronic non occlusive DVT Continue  Eliquis 2.5 mg twice daily for anticoagulation prophylaxis. Recommend leg elevation and compression stocking.    Follow up 2 weeks.  All questions were answered. The patient knows to call the clinic with any problems, questions or concerns.  [name], MD, PhD [facility] Hematology Oncology 12/04/2023      HISTORY OF PRESENTING ILLNESS:  Oncology History  Rectal cancer (HCC)  01/23/2019 Initial Diagnosis   Rectal cancer [facility]) Patient initially presented with complaints of postmenopausal bleeding on 08/16/2018.  History of was menopausal vaginal bleeding in 2016 which resulted in cervical polypectomy.  Pathology 02/04/2015 showed cervical polyp, consistent with benign endometrial polyp.  [name] after polypectomy due to anxiety associated with pelvic exams.  pelvic exam on 08/16/2018 reviewed cervical abnormality and from enlarged uterus. Seen by Dr. [name] on 10/29/2018.  Endometrial biopsy and a Pap smear was performed. 10/29/2018 Pap smear showed adenocarcinoma, favor endometrial origin. 10/29/2018 endometrial biopsy showed endometrioid carcinoma, FIGO grade 1.  10/29/2018- TA & TV Ultrasound revealed: Anteverted uterus measuring 8.7 x 5.6 x 6.4 cm without evidence of focal masses.  The endometrium measuring 24.1 mm (thickened) and heterogeneous.  Right and left ovaries not visualized.  No adnexal masses identified.  No free fluid in cul-de-sac.  Patient was seen by Dr. [name] in clinic on 11/13/2018.  Cervical exam reveals 2 cm exophytic irregular mass consistent with malignancy.    11/19/2018 CT chest abdomen pelvis with contrast showed thickened endometrium with some irregularity compatible with the provided diagnosis of endometrial malignancy.  There is a mildly prominent left  inguinal node 1.4 cm.  Patient was seen by Dr. [name] on 11/20/2018 and left groin lymph node biopsy was recommended.  11/26/2018 patient underwent left inguinal lymph node biopsy. Pathology showed metastatic adenocarcinoma consistent with colorectal origin.  CDX 2+.  Case was discussed on tumor board.  Recommend colonoscopy for further evaluation.  Patient reports significant weight loss 30 pounds over the last year.  Chronic vaginal spotting. Change of bowel habits the past few months.  More constipated.  Family history positive for brother who has colon cancer prostate cancer.  patient has underwent colonoscopy on 12/03/2018 which reviewed a nonobstructing large mass in the rectum.  Also chronic fistula.  Mass was not circumferential.  This was biopsied with a cold forceps for histology.  Pathology came back hyperplastic polyp negative for dysplasia and malignancy. [name], patient underwent flex sigmoidoscopy on 12/06/2018 with rebiopsy of the rectal mass. This time biopsy results came back positive for invasive colorectal adenocarcinoma, moderately differentiated. Immunotherapy for nearly mismatch repair protein (MMR ) was performed.  There is no loss of MMR expression.  low probability of MSI high.   # Seen by [name] for evaluation of resectability for rectal cancer. In addition, she also had a second opinion with [facility] pathology where her endometrial biopsy pathology was changed to  adenocarcinoma, consistent with colorectal primary.   [name]. She has home health that has been assisting with ostomy care  Patient was also evaluated by [facility] oncology.  Recommendation is to proceed with TNT with concurrent chemoradiation followed by neoadjuvant chemotherapy followed by surgical resection. Patient prefers to have treatment done locally with [facility].   # Oncology Treatment:  02/03/2019- 03/19/2019  concurrent Xeloda and  radiation.  Xeloda dose 825mg  /m2 BID - rounded to 1650mg  BID- on days of radiation. 04/09/2019, started on FOLFOX with bolus early.  Omitted.  07/16/2019 finished 8 cycles of FOLFOX. 09/17/19 APR/posterior vaginectomy/TAH/BSO/VY-flap pT4b pN0 with close vaginal margin 0.2 mm.  Uterus and ovaries negative for malignancy.   10/06/2019 Cancer Staging   Staging form: Colon and Rectum, AJCC 8th Edition - Pathologic stage from 10/06/2019: Stage IIC (ypT4b, pN0, cM0) - Signed by [name], MD on 10/06/2019   06/30/2020 Cancer Staging   Staging form: Colon and Rectum, AJCC 8th Edition - Clinical stage from 06/30/2020: [name] - Signed by [name], MD on 04/17/2022 Stage prefix: Recurrence Total positive nodes: 1   06/30/2020 Relapse/Recurrence   vaginal introitus mass biopsied. Pathology is consistent with metastatic colorectal adenocarcinoma    07/20/2020 - 09/13/2020 Chemotherapy   Concurrent chemotherapy with Xeloda with Radiation.  08/02/2020-08/16/2020, . Xeloda was held due to neutropenia    08/17/2020 - 09/06/2020 Radiation Therapy   Xeloda 1500 mg twice daily concurrently with radiation    01/31/2021 Cancer Staging   Staging form: Colon and Rectum, AJCC 8th Edition - Pathologic: Stage Unknown (rpTX, pNX, cM1) - Signed by [name], MD on 01/31/2021 Stage prefix: Recurrence   01/31/2021 - 08/20/2021 Chemotherapy   FOLFIRI + Bev  Irinotecan dose was reduced, eventually 100mg /m2  Irinotecan was dropped in November 2022 due to side effects. Negative for UGT1A1*28    09/13/2021 -  Chemotherapy   maintenance 5-FU/bevacizumab    11/28/2021 -  Chemotherapy   5-FU/Irinotecan/bevacizumab. Irinotecan 100 mg/m2 was added back due to progressively increasing CEA.    07/14/2022 Imaging   Bone scan  1. No evidence of skeletal metastatic disease in the hips. 2. Asymmetric  increased activity in the RIGHT humeral head is favored degenerative as described above. If pain in the RIGHT shoulder  consider further evaluation with plain film or MRI   07/17/2022 Imaging   1. Stable scattered small bilateral pulmonary nodules, largest 8 by 6 mm in the right middle lobe. 2. Subtle chronically stable posterior cortical irregularity and heterogeneity in the left iliac bone, not appreciable on the bone scan of 07/14/2022. This has been present at least over the past year and could reflect radiation therapy related findings although strictly speaking, malignant involvement of the left iliac bone is difficult to exclude. If clinically warranted this could be further assessed with nuclear medicine PET-CT or MRI of the bony pelvis with and without contrast. 3. Pelvic floor laxity with loops of small bowel extending 3 cm below the pubococcygeal line. 4. Aortic and systemic atherosclerosis as detailed above. Aortic Atherosclerosis    12/26/2022 Imaging   CT chest abdomen pelvis w contrast 1. Stable scattered solid pulmonary nodules. 2. Right inguinal and right pelvic lymph nodes which were hypermetabolic on prior PET-CT are stable. 3. Osseous lesion of the proximal right humerus demonstrates increased sclerosis when compared with the prior PET-CT, possibly due to treatment response. 4. 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 5. Aortic Atherosclerosis     06/25/2023 Imaging   CT chest abdomen pelvis w contrast showed 1. Increased size of the solid bilateral pulmonary nodules,consistent with worsening metastatic disease. 2. Increased size of the right external iliac and inguinal lymph nodes, concerning for worsening metastatic disease. 3. No significant interval change in the left iliac bone osseous metastasis. 4. Enlarged main pulmonary artery measuring 3.6 cm, which can be seen in the setting of pulmonary arterial hypertension. 5.  Aortic Atherosclerosis (ICD10-I70.0)     Miscellaneous   Tempus liquid biopsy showed no actionable variants.  NOTCH1 missense variant, BRCA2 missense variant  , MYC missense variant   07/11/2023 Imaging   PET showed 1. Multifocal tracer avid abdominopelvic lymph nodes are identified compatible with metastatic disease. 2. Multifocal bilateral pulmonary nodules are identified compatible with pulmonary metastasis.  3. Diffuse increased radiotracer uptake throughout the marrow space is identified diminishing sensitivity for detecting osseous metastasis. No abnormal increased radiotracer uptake identified above background increased bone marrow activity to indicate metabolically active bone metastases. 4.  Aortic Atherosclerosis   07/23/2023 - 11/13/2023 Chemotherapy   switch to dose reduced FOLFOX    09/16/2023 Imaging   CT abdomen pelvis w contrast  1. Small-bowel obstruction with transition point in the anterior aspect of the lower abdomen, left of midline. This may be secondary to adhesions. 2. Small volume ascites. 3. Prior colon resection with left anterior abdominal wall colostomy. Adjacent parastomal hernia. Small volume fluid within the hernia sac. 4. Stable pulmonary metastatic disease. 5. Stable metastatic lesion of the left iliac wing. 6. Enlarged right external iliac chain lymph node measuring 1.4 cm, previously 1.3 cm. Several mildly prominent retroperitoneal lymph nodes have increased in size from prior but remain subcentimeter in size. 7. Aortic atherosclerosis (ICD10-I70.0).   09/16/2023 - 09/20/2023 Hospital Admission   Hospitalized due to Small bowel obstruction due to adhesions    09/24/2023 Imaging   1. Bilateral pulmonary metastases are all stable from 06/25/2023 chest CT. 2. No thoracic adenopathy. No new or progressive metastatic disease in the chest. 3. Trace bilateral posterior pleural effusions. 4. Two-vessel coronary atherosclerosis. 5. Dilated main pulmonary artery, suggesting pulmonary arterial hypertension. 6.  Aortic Atherosclerosis (ICD10-I70.0).   11/28/2023 Imaging  CT chest abdomen pelvis with contrast 1.  Increased size of retroperitoneal, iliac side chain and inguinal lymph nodes, concerning for progressive nodal metastatic disease. 2. Numerous bilateral solid pulmonary nodules are again seen, some of which have increased in size and others are stable in size overall compatible with disease progression. 3. Increased size of the subcapsular lesion in the anterior inferior right lobe of the liver, which has been stable over multiple prior examinations and was not FDG avid on prior PET-CT. Suggest attention on follow-up imaging. 4. 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 5. Questionable wall thickening of matted loops of small bowel in the left lower quadrant, suggest attention on follow-up imaging. 6. Small volume abdominopelvic free fluid. 7.  Aortic Atherosclerosis (ICD10-I70.0).   Metastatic adenocarcinoma [facility])       # Oncology Treatment:  02/03/2019- 03/19/2019  concurrent Xeloda and radiation.  Xeloda dose 825mg  /m2 BID - rounded to 1650mg  BID- on days of radiation. 04/09/2019, started on FOLFOX with bolus early.  Omitted.  07/16/2019 finished 8 cycles of FOLFOX. 09/17/19 APR/posterior vaginectomy/TAH/BSO/VY-flap pT4b pN0 with close vaginal margin 0.2 mm.  Uterus and ovaries negative for malignancy. Patient reports bilateral lower extremity numbness and tingling, intermittent, left worse than right. She has lost a lot of weight since her APR surgery.   #Family history with half brother having's history of colon cancer prostate cancer.  Personal history of colorectal cancer.  Patient has not decided if she wants genetic testing.    # history of PE( 01/13/2020)  in the bilateral lower extremity DVT (01/13/2020).   She finishes 6 months of anticoagulation with Eliquis 5 mg twice daily. Now switched to Eliquis 2.5 mg twice daily..  # She has now developed recurrent disease. #06/30/20  vaginal introitus mass biopsied. Pathology is consistent with  metastatic colorectal adenocarcinoma I have discussed with [name]  Dr. Luciano Cutter and the mass is not resectable. Patient has also had colonoscopy by Dr. Tobi Bastos yesterday. Normal examination. # 07/16/2020 cycle 1 FOLFIRI  # 07/20/2020 PET scan was done for further evaluation, images are consistent with local recurrence, no distant metastasis. #Discussed with radiation oncology Dr. Rushie Chestnut will recommends concurrent chemotherapy and radiation. 08/02/2020-08/16/2020, patient starts radiation.  Xeloda was held due to neutropenia 08/17/2020,-09/06/2020 Xeloda 1500 mg twice daily concurrently with radiation  01/31/21 started on FOLFIRI + Bev 05/18/2021 CT chest abdomen pelvis showed Previously noted enlargement of bilateral inguinal lymph nodes is resolved, consistent with treatment response of nodal metastatic disease. Interval decrease in size of multiple small bilateral pulmonary nodules, consistent with treatment response of pulmonary metastatic disease. No evidence of new metastatic disease. 05/24/2021 - 08/30/2021, continued on FOLFIRI plus bevacizumab.  Irinotecan dose was reduced, eventually 100mg /m2  09/02/2021, CT chest abdomen pelvis without contrast Showed small bilateral pulmonary nodules, unchanged.  Stable metastatic disease.  No noncontrast evidence of new metastatic disease in the chest abdomen pelvis.  Small parastomal hernia.  Enlargement of main pulmonary artery.  Coronary artery disease.  09/13/2021, maintenance 5-FU/bevacizumab 11/28/2021, 5-FU/Irinotecan/bevacizumab.  Irinotecan 100 mg/m2 was added back due to progressively increasing CEA.  -right lower extremity edema, US showed non occlusive DVT, edema has improved.   INTERVAL HISTORY Jacqueline Yoder is a 78 y.o. female who has above history reviewed by me presents for follow-up of rectal cancer. On Irinotecan and panitumumab. she tolerates well.   She was accompanied by her daughter. + she reports manageable neuropathy symptoms  of her fingertips, intermittent,  worse  when exposed to cold temperature.  + chronic loose ostomy output, stable, not worse.  + Right shoulder pain. +  pound weight loss after started on Dyazide.   Review of Systems  Constitutional:  Positive for fatigue. Negative for appetite change, chills, fever and unexpected weight change.  HENT:   Negative for hearing loss and voice change.   Eyes:  Negative for eye problems.  Respiratory:  Negative for chest tightness and cough.   Cardiovascular:  Negative for chest pain.  Gastrointestinal:  Positive for diarrhea. Negative for abdominal distention, abdominal pain, blood in stool, constipation and nausea.  Endocrine: Negative for hot flashes.  Genitourinary:  Negative for difficulty urinating and frequency.   Musculoskeletal:  Positive for arthralgias.  Skin:  Negative for itching.  Neurological:  Positive for numbness. Negative for extremity weakness.  Hematological:  Negative for adenopathy.  Psychiatric/Behavioral:  Negative for confusion.     MEDICAL HISTORY:  Past Medical History:  Diagnosis Date   Allergy    Arthritis    Blood clot in vein    Family history of colon cancer    GERD (gastroesophageal reflux disease)    Hypercholesteremia    Hypertension    Hypertension    Lower extremity edema    Personal history of chemotherapy    Rectal cancer (HCC) 12/2018   Urinary incontinence     SURGICAL HISTORY: Past Surgical History:  Procedure Laterality Date   ABDOMINAL HYSTERECTOMY     CHOLECYSTECTOMY  1971   COLONOSCOPY WITH PROPOFOL N/A 12/03/2018   Procedure: COLONOSCOPY WITH PROPOFOL;  Surgeon: Midge Minium, MD;  Location: [facility] ENDOSCOPY;  Service: Endoscopy;  Laterality: N/A;   COLONOSCOPY WITH PROPOFOL N/A 07/15/2020   Procedure: COLONOSCOPY WITH PROPOFOL;  Surgeon: Wyline Mood, MD;  Location[facility] ENDOSCOPY;  Service: Gastroenterology;  Laterality: N/A;   FLEXIBLE SIGMOIDOSCOPY N/A 12/06/2018   Procedure: FLEXIBLE  SIGMOIDOSCOPY;  Surgeon: Wyline Mood, MD;  Location: [facility] ENDOSCOPY;  Service: Endoscopy;  Laterality: N/A;   LAPAROSCOPIC COLOSTOMY  01/06/2019   PORTACATH PLACEMENT N/A 04/03/2019   Procedure: INSERTION PORT-A-CATH;  Surgeon: Leafy Ro, MD;  Location: [facility] ORS;  Service: General;  Laterality: N/A;    SOCIAL HISTORY: Social History   Socioeconomic History   Marital status: Widowed    Spouse name: Not on file   Number of children: Not on file   Years of education: Not on file   Highest education level: Some college, no degree  Occupational History   Not on file  Tobacco Use   Smoking status: Former    Current packs/day: 0.00    Types: Cigarettes    Quit date: 12/02/1977    Years since quitting: 46.0   Smokeless tobacco: Former  Building services engineer status: Never Used  Substance and Sexual Activity   Alcohol use: Never   Drug use: Never   Sexual activity: Not Currently    Birth control/protection: None  Other Topics Concern   Not on file  Social History Narrative   Lives with daughter   Social Drivers of Health   Financial Resource Strain: Low Risk  (11/15/2023)   Overall Financial Resource Strain (CARDIA)    Difficulty of Paying Living Expenses: Not hard at all  Food Insecurity: No Food Insecurity (11/15/2023)   Hunger Vital Sign    Worried About Running Out of Food in the Last Year: Never true    Ran Out of Food in the Last Year: Never true  Transportation Needs: No Transportation Needs (11/15/2023)  PRAPARE - Administrator, Civil Service (Medical): No    Lack of Transportation (Non-Medical): No  Physical Activity: Insufficiently Active (11/15/2023)   Exercise Vital Sign    Days of Exercise per Week: 3 days    Minutes of Exercise per Session: 10 min  Stress: No Stress Concern Present (11/15/2023)   [employer] of Occupational Health - Occupational Stress Questionnaire    Feeling of Stress : Not at all  Social Connections: Moderately Integrated  (11/15/2023)   Social Connection and Isolation Panel [NHANES]    Frequency of Communication with Friends and Family: More than three times a week    Frequency of Social Gatherings with Friends and Family: More than three times a week    Attends Religious Services: More than 4 times per year    Active Member of [employer] or Organizations: Yes    Attends Banker Meetings: More than 4 times per year    Marital Status: Widowed  Intimate Partner Violence: Not At Risk (09/27/2023)   Humiliation, Afraid, Rape, and Kick questionnaire    Fear of Current or Ex-Partner: No    Emotionally Abused: No    Physically Abused: No    Sexually Abused: No    FAMILY HISTORY: Family History  Problem Relation Age of Onset   Colon cancer Brother 66       exposure to chemicals [place]   Hypertension Mother    Stroke Mother    Kidney failure Father    Breast cancer Neg Hx    Ovarian cancer Neg Hx     ALLERGIES:  is allergic to sulfamethoxazole-trimethoprim.  MEDICATIONS:  Current Outpatient Medications  Medication Sig Dispense Refill   acetaminophen (TYLENOL) 650 MG CR tablet Take 650 mg by mouth every 8 (eight) hours as needed for pain.     Cholecalciferol (VITAMIN D3) 2000 units capsule Take 2,000 Units by mouth daily.     clindamycin (CLINDAGEL) 1 % gel Apply topically 2 (two) times daily. 30 g 5   diclofenac sodium (VOLTAREN) 1 % GEL Apply 2 g topically 4 (four) times daily as needed (joint pain).  11   diphenoxylate-atropine (LOMOTIL) 2.5-0.025 MG tablet Take 1 tablet by mouth 4 (four) times daily as needed for diarrhea or loose stools. 120 tablet 0   ELIQUIS 2.5 MG TABS tablet TAKE 1 TABLET BY MOUTH TWICE  DAILY 200 tablet 2   fluticasone (FLONASE) 50 MCG/ACT nasal spray Place 1 spray into both nostrils daily as needed for allergies or rhinitis.     gentamicin cream (GARAMYCIN) 0.1 % Apply to affected toe once daily. 30 g 1   ketoconazole (NIZORAL) 2 % cream Apply 1 Application topically  daily. For up to 2 weeks. May repeat if need. 30 g 2   loratadine (CLARITIN) 10 MG tablet Take 10 mg by mouth daily.     magnesium chloride (SLOW-MAG) 64 MG TBEC SR tablet Take 1 tablet (64 mg total) by mouth 2 (two) times daily. 60 tablet 2   Multiple Vitamins-Minerals (ONE-A-DAY WOMENS 50 PLUS PO) Take 1 tablet by mouth daily.      nystatin (MYCOSTATIN/NYSTOP) powder APPLY 1 POWDER TOPICALLY THREE TIMES DAILY 45 g 0   potassium chloride SA (KLOR-CON M) 20 MEQ tablet TAKE 1 TABLET BY MOUTH DAILY 100 tablet 2   predniSONE (DELTASONE) 10 MG tablet Take 3 tabs on days 1-3, 2 tabs on days 4-6, 1 tab on days 7-9 18 tablet 0   simvastatin (ZOCOR) 40 MG tablet  Take 1 tablet (40 mg total) by mouth at bedtime. 100 tablet 3   zinc gluconate 50 MG tablet Take 50 mg by mouth daily.     lidocaine-prilocaine (EMLA) cream Apply 1 Application topically daily as needed. Apply small amount to port and cover with saran wrap 1-2 hours prior to port access 30 g 3   triamcinolone cream (KENALOG) 0.5 % Apply 1 Application topically 2 (two) times daily. 90 g 1   triamterene-hydrochlorothiazide (DYAZIDE) 37.5-25 MG capsule Take 1 each (1 capsule total) by mouth daily. 90 capsule 1   No current facility-administered medications for this visit.   Facility-Administered Medications Ordered in Other Visits  Medication Dose Route Frequency Provider Last Rate Last Admin   heparin lock flush 100 UNIT/ML injection              PHYSICAL EXAMINATION: ECOG PERFORMANCE STATUS: 1 - Symptomatic but completely ambulatory  Physical Exam Constitutional:      General: She is not in acute distress. HENT:     Head: Normocephalic and atraumatic.  Eyes:     General: No scleral icterus. Cardiovascular:     Rate and Rhythm: Normal rate.     Heart sounds: Normal heart sounds.  Pulmonary:     Effort: Pulmonary effort is normal. No respiratory distress.     Breath sounds: No wheezing.  Abdominal:     General: Bowel sounds are  normal. There is no distension.     Palpations: Abdomen is soft.     Comments: + Colostomy bag   Musculoskeletal:        General: No deformity. Normal range of motion.     Cervical back: Normal range of motion and neck supple.     Right lower leg: Edema present.  Skin:    General: Skin is warm and dry.  Neurological:     Mental Status: She is alert and oriented to person, place, and time. Mental status is at baseline.     Cranial Nerves: No cranial nerve deficit.     Coordination: Coordination normal.       LABORATORY DATA:  I have reviewed the data as listed    Latest Ref Rng & Units 12/04/2023    8:14 AM 11/13/2023    8:00 AM 10/30/2023    7:50 AM  CBC  WBC 4.0 - 10.5 K/uL 5.5  2.8  3.7   Hemoglobin 12.0 - 15.0 g/dL 16.1  09.6  04.5   Hematocrit 36.0 - 46.0 % 37.3  32.9  32.6   Platelets 150 - 400 K/uL 186  176  198       Latest Ref Rng & Units 12/04/2023    8:14 AM 11/13/2023    8:00 AM 10/30/2023    7:50 AM  CMP  Glucose 70 - 99 mg/dL 409  811  914   BUN 8 - 23 mg/dL 18  13  12    Creatinine 0.44 - 1.00 mg/dL 7.82  9.56  2.13   Sodium 135 - 145 mmol/L 136  139  138   Potassium 3.5 - 5.1 mmol/L 4.0  3.9  4.1   Chloride 98 - 111 mmol/L 99  105  104   CO2 22 - 32 mmol/L 28  26  26    Calcium 8.9 - 10.3 mg/dL 9.4  9.2  9.0   Total Protein 6.5 - 8.1 g/dL 7.2  6.5  6.5   Total Bilirubin 0.0 - 1.2 mg/dL 0.6  0.6  0.4   Alkaline Phos  38 - 126 U/L 78  72  82   AST 15 - 41 U/L 46  37  43   ALT 0 - 44 U/L 24  15  18       RADIOGRAPHIC STUDIES: I have personally reviewed the radiological images as listed and agreed with the findings in the report. CT CHEST ABDOMEN PELVIS W CONTRAST Result Date: 11/28/2023 CLINICAL DATA:  Rectal cancer, follow-up.  * Tracking Code: BO * EXAM: CT CHEST, ABDOMEN, AND PELVIS WITH CONTRAST TECHNIQUE: Multidetector CT imaging of the chest, abdomen and pelvis was performed following the standard protocol during bolus administration of intravenous  contrast. RADIATION DOSE REDUCTION: This exam was performed according to the departmental dose-optimization program which includes automated exposure control, adjustment of the mA and/or kV according to patient size and/or use of iterative reconstruction technique. CONTRAST:  85mL OMNIPAQUE IOHEXOL 300 MG/ML  SOLN COMPARISON:  Multiple priors including CT September 24, 2023 FINDINGS: CT CHEST FINDINGS Cardiovascular: Right chest Port-A-Cath with tip in the right atrium. Thoracic aortic atherosclerosis. Dilated main pulmonary artery. Coronary artery calcifications. Normal size heart. Mediastinum/Nodes: Prominent supraclavicular lymph nodes measure 9 mm on image 6/2 previously 7 mm. No pathologically enlarged mediastinal, hilar or axillary lymph nodes. No suspicious thyroid nodule.  Tiny hiatal hernia. Lungs/Pleura: Numerous bilateral solid pulmonary nodules are again seen. Some of which have increased in size and others are stable in size. For reference:: -medial right middle lobe pulmonary nodule measures 18 x 16 mm on image 95/3 previously 16 x 14 mm. -anterior left lower lobe pulmonary nodule measures 7 mm on image 111/3, unchanged. -left upper lobe pulmonary nodule measures 9 mm on image 65/3 previously 7 mm. Musculoskeletal: No aggressive lytic or blastic lesion of bone. Multilevel degenerative change of the spine. CT ABDOMEN PELVIS FINDINGS Hepatobiliary: Increased size of the subcapsular lesion in the anterior inferior right lobe of the liver now measuring 10 mm on image 69/2 previously 8 mm. Gallbladder is surgically absent. No biliary ductal dilation. Pancreas: No pancreatic ductal dilation or evidence of acute inflammation. Spleen: No splenomegaly.  Stable splenic lymphangioma/cysts. Adrenals/Urinary Tract: No suspicious adrenal nodule. Stable bilateral renal cysts. No hydronephrosis. Urinary bladder is unremarkable for degree of distension. Stomach/Bowel: Radiopaque enteric contrast material traverses the  transverse colon. Stomach is unremarkable for degree of distension. No pathologic dilation of small or large bowel. Questionable wall thickening of matted loops of small bowel in the left lower quadrant on image 89/2. Prior partial colectomy and rectal resection with left anterior abdominal wall colostomy. Fluid and soft tissue stranding in the mesorectal/presacral space without discrete enhancing soft tissue nodularity. Vascular/Lymphatic: Aortic atherosclerosis. Increased size of retroperitoneal, iliac side chain and inguinal lymph nodes. For reference: Left periaortic lymph node measures 12 mm in short axis on image 66/2 previously 9 mm. -right external iliac lymph node measuring 18 mm in short axis on image 98/2 previously measured 14 mm -Right pelvic sidewall lymph node measures 10 mm in short axis on image 93/2 previously 7 mm. Reproductive: Uterus is surgically absent. Other: Small volume abdominopelvic free fluid. Postsurgical change in the abdominal wall. Musculoskeletal: Similar appearance of the permeative lytic metastatic lesion in the left iliac wing. No new aggressive lytic or blastic lesion of bone. Multilevel degenerative change of the spine. Demineralization of bone. IMPRESSION: 1. Increased size of retroperitoneal, iliac side chain and inguinal lymph nodes, concerning for progressive nodal metastatic disease. 2. Numerous bilateral solid pulmonary nodules are again seen, some of which have increased in size and  others are stable in size overall compatible with disease progression. 3. Increased size of the subcapsular lesion in the anterior inferior right lobe of the liver, which has been stable over multiple prior examinations and was not FDG avid on prior PET-CT. Suggest attention on follow-up imaging. 4. 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 5. Questionable wall thickening of matted loops of small bowel in the left lower quadrant, suggest attention on  follow-up imaging. 6. Small volume abdominopelvic free fluid. 7.  Aortic Atherosclerosis (ICD10-I70.0). Electronically Signed   By: Maudry Mayhew M.D.   On: 11/28/2023 15:20   DG Bone Density Result Date: 11/19/2023 EXAM: DUAL X-RAY ABSORPTIOMETRY (DXA) FOR BONE MINERAL DENSITY IMPRESSION: Your patient Aveyah Greenwood completed a BMD test on 11/19/2023 using the [employer] DXA System (software version: 14.10) manufactured by [employer]. The following summarizes the results of our evaluation. Technologist:VLM PATIENT BIOGRAPHICAL: Name: Talena, Neira Patient ID: [id number] Birth Date: 1946-09-07 Height: 64.0 in. Gender: Female Exam Date: 11/19/2023 Weight: 171.2 lbs. Indications: Chemo, Colon CA, Hysterectomy Fractures: Treatments: Vitamin D DENSITOMETRY RESULTS: Site      Region        Measured Date Measured Age WHO Classification Young Adult T-score BMD         %Change vs. Previous Significant Change (*) AP Spine L1-L4 (L2,L3) 11/19/2023 [age] Normal 0.2 1.203 g/cm2 - - DualFemur Total Right 11/19/2023 76.6 Normal -0.5 0.946 g/cm2 - - ASSESSMENT: The BMD measured at Femur Total Right is 0.946 g/cm2 with a T-score of -0.5. This patient is considered normal according to World Health Organization [facility]) criteria. L-2 & L-3 were excluded due to degenerative changes. The scan quality is good. World Science writer [facility]) criteria for post-menopausal, Caucasian Women: Normal:                   T-score at or above -1 SD Osteopenia/low bone mass: T-score between -1 and -2.5 SD Osteoporosis:             T-score at or below -2.5 SD RECOMMENDATIONS: 1. All patients should optimize calcium and vitamin D intake. 2. Consider FDA-approved medical therapies in postmenopausal women and men aged 46 years and older, based on the following: a. A hip or vertebral(clinical or morphometric) fracture b. T-score < -2.5 at the femoral neck or spine after appropriate evaluation to exclude secondary causes c. Low bone mass  (T-score between -1.0 and -2.5 at the femoral neck or spine) and a 10-year probability of a hip fracture > 3% or a 10-year probability of a major osteoporosis-related fracture > 20% based on the [place]-adapted WHO algorithm 3. Clinician judgment and/or patient preferences may indicate treatment for people with 10-year fracture probabilities above or below these levels FOLLOW-UP: People with diagnosed cases of osteoporosis or at high risk for fracture should have regular bone mineral density tests. For patients eligible for Medicare, routine testing is allowed once every 2 years. The testing frequency can be increased to one year for patients who have rapidly progressing disease, those who are receiving or discontinuing medical therapy to restore bone mass, or have additional risk factors. I have reviewed this report, and agree with the above findings. [facility] Electronically Signed   By: Frederico Hamman M.D.   On: 11/19/2023 10:00   CT Chest W Contrast Result Date: 09/24/2023 CLINICAL DATA:  Metastatic rectal cancer on palliative chemotherapy. Restaging. * Tracking Code: BO * EXAM: CT CHEST WITH CONTRAST TECHNIQUE:  Multidetector CT imaging of the chest was performed during intravenous contrast administration. RADIATION DOSE REDUCTION: This exam was performed according to the departmental dose-optimization program which includes automated exposure control, adjustment of the mA and/or kV according to patient size and/or use of iterative reconstruction technique. CONTRAST:  75mL OMNIPAQUE IOHEXOL 300 MG/ML  SOLN COMPARISON:  06/25/2023 chest CT.  07/09/2023 PET-CT. FINDINGS: Cardiovascular: Normal heart size. No significant pericardial effusion/thickening. Left anterior descending and right coronary atherosclerosis. Right internal jugular Port-A-Cath terminates at the cavoatrial junction. Atherosclerotic nonaneurysmal thoracic aorta. Dilated main pulmonary artery (3.5 cm diameter). No central  pulmonary emboli. Mediastinum/Nodes: No significant thyroid nodules. Unremarkable esophagus. No pathologically enlarged axillary, mediastinal or hilar lymph nodes. Lungs/Pleura: No pneumothorax. Trace bilateral posterior pleural effusions. No acute consolidative airspace disease. Numerous (greater than 20) solid pulmonary nodules scattered throughout both lungs involving all lung lobes, all stable in size since 06/25/2023 chest CT with representative 1.6 x 1.4 cm medial right middle lobe nodule (series 4/image 101), 0.7 cm anterior left lower lobe nodule (series 4/image 110) and 0.7 cm left upper lobe nodule (series 4/image 64). No new significant pulmonary nodules. Upper abdomen: Numerous simple upper renal cysts bilaterally, largest 5.9 cm on the right, for which no follow-up imaging is recommended. Musculoskeletal: No aggressive appearing focal osseous lesions. Moderate thoracic spondylosis. IMPRESSION: 1. Bilateral pulmonary metastases are all stable from 06/25/2023 chest CT. 2. No thoracic adenopathy. No new or progressive metastatic disease in the chest. 3. Trace bilateral posterior pleural effusions. 4. Two-vessel coronary atherosclerosis. 5. Dilated main pulmonary artery, suggesting pulmonary arterial hypertension. 6.  Aortic Atherosclerosis (ICD10-I70.0). Electronically Signed   By: Delbert Phenix M.D.   On: 09/24/2023 19:15   DG ABD ACUTE 2+V W 1V CHEST Result Date: 09/19/2023 CLINICAL DATA:  Small bowel obstruction. EXAM: DG ABDOMEN ACUTE WITH 1 VIEW CHEST COMPARISON:  September 18, 2023. FINDINGS: Right internal jugular Port-A-Cath is noted. Right basilar atelectasis or infiltrate is noted. Probable small pleural effusions are noted. Nasogastric tube tip seen in proximal stomach. No abnormal bowel dilatation is noted. Postsurgical changes are noted in right upper quadrant. IMPRESSION: Right basilar atelectasis or infiltrate with probable small pleural effusions. Nasogastric tube tip seen in proximal  stomach. No abnormal bowel dilatation. Electronically Signed   By: Lupita Raider M.D.   On: 09/19/2023 11:20   DG Abd 1 View Result Date: 09/18/2023 CLINICAL DATA:  78 year old female with abdominal distension. Small-bowel obstruction. EXAM: ABDOMEN - 1 VIEW COMPARISON:  09/16/2023 radiograph and CT Abdomen and Pelvis. FINDINGS: Enteric tube remains in place, side hole at the level of the gastric body. Paucity of bowel gas similar to the CT scout view 2 days ago. Stable abdominal wall surgical clips or sutures. Stable lung bases. Stable visualized osseous structures. Vascular calcifications. IMPRESSION: 1. Stable and satisfactory enteric tube placement. 2. Persistent paucity of bowel gas similar to the CT scout view. Residual small-bowel obstruction difficult to exclude. Electronically Signed   By: Odessa Fleming M.D.   On: 09/18/2023 08:37   DG Abd 1 View Result Date: 09/16/2023 CLINICAL DATA:  X-ray for NG tube placement EXAM: ABDOMEN - 1 VIEW limited for tube placement COMPARISON:  CT earlier 09/16/2023 FINDINGS: Limited x-ray of the upper abdomen demonstrates enteric tube with tip overlying the midbody of the stomach. Surgical changes in the midabdomen. Overlapping cardiac leads. IMPRESSION: Limited x-ray for tube placement has a enteric tube with tip overlying the midbody of the stomach. Electronically Signed   By: Piedad Climes.D.  On: 09/16/2023 18:09   CT ABDOMEN PELVIS W CONTRAST Result Date: 09/16/2023 CLINICAL DATA:  Abdominal pain and nausea EXAM: CT ABDOMEN AND PELVIS WITH CONTRAST TECHNIQUE: Multidetector CT imaging of the abdomen and pelvis was performed using the standard protocol following bolus administration of intravenous contrast. RADIATION DOSE REDUCTION: This exam was performed according to the departmental dose-optimization program which includes automated exposure control, adjustment of the mA and/or kV according to patient size and/or use of iterative reconstruction technique.  CONTRAST:  80mL OMNIPAQUE IOHEXOL 300 MG/ML  SOLN COMPARISON:  06/20/2023 FINDINGS: Lower chest: 1.8 x 1.4 cm right middle lobe nodule (series 4, image 16), not significantly changed in size. Additional scattered subcentimeter bibasilar nodules are also unchanged. Mild bibasilar subsegmental atelectasis. Circumferential thickening of the distal esophagus. Hepatobiliary: Stable 8 mm subcapsular lesion in the anterior inferior right hepatic lobe (series 2, image 37). No new focal liver abnormality. Prior cholecystectomy with similar degree of biliary dilatation. Pancreas: Mild ductal dilatation without focal lesion or inflammatory changes. Spleen: Stable splenic cyst.  No splenomegaly. Adrenals/Urinary Tract: Unremarkable adrenal glands. Multiple bilateral renal cysts which require no follow-up imaging. No renal stone or hydronephrosis. Urinary bladder within normal limits. Stomach/Bowel: Prior colon resection with left anterior abdominal wall colostomy. Adjacent parastomal hernia. Small volume fluid within the hernia sac. Stomach is distended. Multiple dilated loops of small bowel throughout the abdomen with transition point in the anterior aspect of the lower abdomen, left of midline (series 2, image 54). Normal appendix in the right lower quadrant. Vascular/Lymphatic: Aortoiliac atherosclerosis. Enlarged right external iliac chain lymph node measuring 1.4 cm (series 2, image 68), previously 1.3 cm. Several mildly prominent retroperitoneal lymph nodes have increased in size from prior but remain subcentimeter in size. Reproductive: Status post hysterectomy. No adnexal masses. Other: Small volume ascites.  No pneumoperitoneum. Musculoskeletal: Permeative metastatic lesion the left iliac wing not appreciably changed. No pathologic fracture. No new bony lesion identified. IMPRESSION: 1. Small-bowel obstruction with transition point in the anterior aspect of the lower abdomen, left of midline. This may be secondary to  adhesions. 2. Small volume ascites. 3. Prior colon resection with left anterior abdominal wall colostomy. Adjacent parastomal hernia. Small volume fluid within the hernia sac. 4. Stable pulmonary metastatic disease. 5. Stable metastatic lesion of the left iliac wing. 6. Enlarged right external iliac chain lymph node measuring 1.4 cm, previously 1.3 cm. Several mildly prominent retroperitoneal lymph nodes have increased in size from prior but remain subcentimeter in size. 7. Aortic atherosclerosis (ICD10-I70.0). Electronically Signed   By: Duanne Guess D.O.   On: 09/16/2023 14:07      CT CHEST ABDOMEN PELVIS W CONTRAST Result Date: 11/28/2023 CLINICAL DATA:  Rectal cancer, follow-up.  * Tracking Code: BO * EXAM: CT CHEST, ABDOMEN, AND PELVIS WITH CONTRAST TECHNIQUE: Multidetector CT imaging of the chest, abdomen and pelvis was performed following the standard protocol during bolus administration of intravenous contrast. RADIATION DOSE REDUCTION: This exam was performed according to the departmental dose-optimization program which includes automated exposure control, adjustment of the mA and/or kV according to patient size and/or use of iterative reconstruction technique. CONTRAST:  85mL OMNIPAQUE IOHEXOL 300 MG/ML  SOLN COMPARISON:  Multiple priors including CT September 24, 2023 FINDINGS: CT CHEST FINDINGS Cardiovascular: Right chest Port-A-Cath with tip in the right atrium. Thoracic aortic atherosclerosis. Dilated main pulmonary artery. Coronary artery calcifications. Normal size heart. Mediastinum/Nodes: Prominent supraclavicular lymph nodes measure 9 mm on image 6/2 previously 7 mm. No pathologically enlarged mediastinal, hilar or axillary lymph  nodes. No suspicious thyroid nodule.  Tiny hiatal hernia. Lungs/Pleura: Numerous bilateral solid pulmonary nodules are again seen. Some of which have increased in size and others are stable in size. For reference:: -medial right middle lobe pulmonary nodule  measures 18 x 16 mm on image 95/3 previously 16 x 14 mm. -anterior left lower lobe pulmonary nodule measures 7 mm on image 111/3, unchanged. -left upper lobe pulmonary nodule measures 9 mm on image 65/3 previously 7 mm. Musculoskeletal: No aggressive lytic or blastic lesion of bone. Multilevel degenerative change of the spine. CT ABDOMEN PELVIS FINDINGS Hepatobiliary: Increased size of the subcapsular lesion in the anterior inferior right lobe of the liver now measuring 10 mm on image 69/2 previously 8 mm. Gallbladder is surgically absent. No biliary ductal dilation. Pancreas: No pancreatic ductal dilation or evidence of acute inflammation. Spleen: No splenomegaly.  Stable splenic lymphangioma/cysts. Adrenals/Urinary Tract: No suspicious adrenal nodule. Stable bilateral renal cysts. No hydronephrosis. Urinary bladder is unremarkable for degree of distension. Stomach/Bowel: Radiopaque enteric contrast material traverses the transverse colon. Stomach is unremarkable for degree of distension. No pathologic dilation of small or large bowel. Questionable wall thickening of matted loops of small bowel in the left lower quadrant on image 89/2. Prior partial colectomy and rectal resection with left anterior abdominal wall colostomy. Fluid and soft tissue stranding in the mesorectal/presacral space without discrete enhancing soft tissue nodularity. Vascular/Lymphatic: Aortic atherosclerosis. Increased size of retroperitoneal, iliac side chain and inguinal lymph nodes. For reference: Left periaortic lymph node measures 12 mm in short axis on image 66/2 previously 9 mm. -right external iliac lymph node measuring 18 mm in short axis on image 98/2 previously measured 14 mm -Right pelvic sidewall lymph node measures 10 mm in short axis on image 93/2 previously 7 mm. Reproductive: Uterus is surgically absent. Other: Small volume abdominopelvic free fluid. Postsurgical change in the abdominal wall. Musculoskeletal: Similar  appearance of the permeative lytic metastatic lesion in the left iliac wing. No new aggressive lytic or blastic lesion of bone. Multilevel degenerative change of the spine. Demineralization of bone. IMPRESSION: 1. Increased size of retroperitoneal, iliac side chain and inguinal lymph nodes, concerning for progressive nodal metastatic disease. 2. Numerous bilateral solid pulmonary nodules are again seen, some of which have increased in size and others are stable in size overall compatible with disease progression. 3. Increased size of the subcapsular lesion in the anterior inferior right lobe of the liver, which has been stable over multiple prior examinations and was not FDG avid on prior PET-CT. Suggest attention on follow-up imaging. 4. 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 5. Questionable wall thickening of matted loops of small bowel in the left lower quadrant, suggest attention on follow-up imaging. 6. Small volume abdominopelvic free fluid. 7.  Aortic Atherosclerosis (ICD10-I70.0). Electronically Signed   By: Maudry Mayhew M.D.   On: 11/28/2023 15:20   DG Bone Density Result Date: 11/19/2023 EXAM: DUAL X-RAY ABSORPTIOMETRY (DXA) FOR BONE MINERAL DENSITY IMPRESSION: Your patient Mizani Dilday completed a BMD test on 11/19/2023 using the [employer] DXA System (software version: 14.10) manufactured by [employer]. The following summarizes the results of our evaluation. Technologist:VLM PATIENT BIOGRAPHICAL: Name: Vanya, Carberry Patient ID: [id number] Birth Date: [birth year] Height: 64.0 in. Gender: Female Exam Date: 11/19/2023 Weight: 171.2 lbs. Indications: Chemo, Colon CA, Hysterectomy Fractures: Treatments: Vitamin D DENSITOMETRY RESULTS: Site      Region        Measured Date Measured  Age WHO Classification Young Adult T-score BMD         %Change vs. Previous Significant Change (*) AP Spine L1-L4 (L2,L3) 11[age]19/2023 77.6 Normal 0.2 1.203 g/cm2 - -  DualFemur Total Right 11/19/2023 76.6 Normal -0.5 0.946 g/cm2 - - ASSESSMENT: The BMD measured at Femur Total Right is 0.946 g/cm2 with a T-score of -0.5. This patient is considered normal according to World Health Organization [facility]) criteria. L-2 & L-3 were excluded due to degenerative changes. The scan quality is good. World Science writer [facility]) criteria for post-menopausal, Caucasian Women: Normal:                   T-score at or above -1 SD Osteopenia/low bone mass: T-score between -1 and -2.5 SD Osteoporosis:             T-score at or below -2.5 SD RECOMMENDATIONS: 1. All patients should optimize calcium and vitamin D intake. 2. Consider FDA-approved medical therapies in postmenopausal women and men aged 14 years and older, based on the following: a. A hip or vertebral(clinical or morphometric) fracture b. T-score < -2.5 at the femoral neck or spine after appropriate evaluation to exclude secondary causes c. Low bone mass (T-score between -1.0 and -2.5 at the femoral neck or spine) and a 10-year probability of a hip fracture > 3% or a 10-year probability of a major osteoporosis-related fracture > 20% based on the [place]-adapted WHO algorithm 3. Clinician judgment and/or patient preferences may indicate treatment for people with 10-year fracture probabilities above or below these levels FOLLOW-UP: People with diagnosed cases of osteoporosis or at high risk for fracture should have regular bone mineral density tests. For patients eligible for Medicare, routine testing is allowed once every 2 years. The testing frequency can be increased to one year for patients who have rapidly progressing disease, those who are receiving or discontinuing medical therapy to restore bone mass, or have additional risk factors. I have reviewed this report, and agree with the above findings. [facility] Electronically Signed   By: Frederico Hamman M.D.   On: 11/19/2023 10:00   CT Chest W Contrast Result Date:  09/24/2023 CLINICAL DATA:  Metastatic rectal cancer on palliative chemotherapy. Restaging. * Tracking Code: BO * EXAM: CT CHEST WITH CONTRAST TECHNIQUE: Multidetector CT imaging of the chest was performed during intravenous contrast administration. RADIATION DOSE REDUCTION: This exam was performed according to the departmental dose-optimization program which includes automated exposure control, adjustment of the mA and/or kV according to patient size and/or use of iterative reconstruction technique. CONTRAST:  75mL OMNIPAQUE IOHEXOL 300 MG/ML  SOLN COMPARISON:  06/25/2023 chest CT.  07/09/2023 PET-CT. FINDINGS: Cardiovascular: Normal heart size. No significant pericardial effusion/thickening. Left anterior descending and right coronary atherosclerosis. Right internal jugular Port-A-Cath terminates at the cavoatrial junction. Atherosclerotic nonaneurysmal thoracic aorta. Dilated main pulmonary artery (3.5 cm diameter). No central pulmonary emboli. Mediastinum/Nodes: No significant thyroid nodules. Unremarkable esophagus. No pathologically enlarged axillary, mediastinal or hilar lymph nodes. Lungs/Pleura: No pneumothorax. Trace bilateral posterior pleural effusions. No acute consolidative airspace disease. Numerous (greater than 20) solid pulmonary nodules scattered throughout both lungs involving all lung lobes, all stable in size since 06/25/2023 chest CT with representative 1.6 x 1.4 cm medial right middle lobe nodule (series 4/image 101), 0.7 cm anterior left lower lobe nodule (series 4/image 110) and 0.7 cm left upper lobe nodule (series 4/image 64). No new significant pulmonary nodules. Upper abdomen: Numerous simple upper renal cysts bilaterally, largest 5.9 cm on the right, for  which no follow-up imaging is recommended. Musculoskeletal: No aggressive appearing focal osseous lesions. Moderate thoracic spondylosis. IMPRESSION: 1. Bilateral pulmonary metastases are all stable from 06/25/2023 chest CT. 2. No  thoracic adenopathy. No new or progressive metastatic disease in the chest. 3. Trace bilateral posterior pleural effusions. 4. Two-vessel coronary atherosclerosis. 5. Dilated main pulmonary artery, suggesting pulmonary arterial hypertension. 6.  Aortic Atherosclerosis (ICD10-I70.0). Electronically Signed   By: Delbert Phenix M.D.   On: 09/24/2023 19:15   DG ABD ACUTE 2+V W 1V CHEST Result Date: 09/19/2023 CLINICAL DATA:  Small bowel obstruction. EXAM: DG ABDOMEN ACUTE WITH 1 VIEW CHEST COMPARISON:  September 18, 2023. FINDINGS: Right internal jugular Port-A-Cath is noted. Right basilar atelectasis or infiltrate is noted. Probable small pleural effusions are noted. Nasogastric tube tip seen in proximal stomach. No abnormal bowel dilatation is noted. Postsurgical changes are noted in right upper quadrant. IMPRESSION: Right basilar atelectasis or infiltrate with probable small pleural effusions. Nasogastric tube tip seen in proximal stomach. No abnormal bowel dilatation. Electronically Signed   By: Lupita Raider M.D.   On: 09/19/2023 11:20   DG Abd 1 View Result Date: 09/18/2023 CLINICAL DATA:  78 year old female with abdominal distension. Small-bowel obstruction. EXAM: ABDOMEN - 1 VIEW COMPARISON:  09/16/2023 radiograph and CT Abdomen and Pelvis. FINDINGS: Enteric tube remains in place, side hole at the level of the gastric body. Paucity of bowel gas similar to the CT scout view 2 days ago. Stable abdominal wall surgical clips or sutures. Stable lung bases. Stable visualized osseous structures. Vascular calcifications. IMPRESSION: 1. Stable and satisfactory enteric tube placement. 2. Persistent paucity of bowel gas similar to the CT scout view. Residual small-bowel obstruction difficult to exclude. Electronically Signed   By: Odessa Fleming M.D.   On: 09/18/2023 08:37   DG Abd 1 View Result Date: 09/16/2023 CLINICAL DATA:  X-ray for NG tube placement EXAM: ABDOMEN - 1 VIEW limited for tube placement COMPARISON:  CT  earlier 09/16/2023 FINDINGS: Limited x-ray of the upper abdomen demonstrates enteric tube with tip overlying the midbody of the stomach. Surgical changes in the midabdomen. Overlapping cardiac leads. IMPRESSION: Limited x-ray for tube placement has a enteric tube with tip overlying the midbody of the stomach. Electronically Signed   By: Karen Kays M.D.   On: 09/16/2023 18:09   CT ABDOMEN PELVIS W CONTRAST Result Date: 09/16/2023 CLINICAL DATA:  Abdominal pain and nausea EXAM: CT ABDOMEN AND PELVIS WITH CONTRAST TECHNIQUE: Multidetector CT imaging of the abdomen and pelvis was performed using the standard protocol following bolus administration of intravenous contrast. RADIATION DOSE REDUCTION: This exam was performed according to the departmental dose-optimization program which includes automated exposure control, adjustment of the mA and/or kV according to patient size and/or use of iterative reconstruction technique. CONTRAST:  80mL OMNIPAQUE IOHEXOL 300 MG/ML  SOLN COMPARISON:  06/20/2023 FINDINGS: Lower chest: 1.8 x 1.4 cm right middle lobe nodule (series 4, image 16), not significantly changed in size. Additional scattered subcentimeter bibasilar nodules are also unchanged. Mild bibasilar subsegmental atelectasis. Circumferential thickening of the distal esophagus. Hepatobiliary: Stable 8 mm subcapsular lesion in the anterior inferior right hepatic lobe (series 2, image 37). No new focal liver abnormality. Prior cholecystectomy with similar degree of biliary dilatation. Pancreas: Mild ductal dilatation without focal lesion or inflammatory changes. Spleen: Stable splenic cyst.  No splenomegaly. Adrenals/Urinary Tract: Unremarkable adrenal glands. Multiple bilateral renal cysts which require no follow-up imaging. No renal stone or hydronephrosis. Urinary bladder within normal limits. Stomach/Bowel: Prior colon resection  with left anterior abdominal wall colostomy. Adjacent parastomal hernia. Small volume  fluid within the hernia sac. Stomach is distended. Multiple dilated loops of small bowel throughout the abdomen with transition point in the anterior aspect of the lower abdomen, left of midline (series 2, image 54). Normal appendix in the right lower quadrant. Vascular/Lymphatic: Aortoiliac atherosclerosis. Enlarged right external iliac chain lymph node measuring 1.4 cm (series 2, image 68), previously 1.3 cm. Several mildly prominent retroperitoneal lymph nodes have increased in size from prior but remain subcentimeter in size. Reproductive: Status post hysterectomy. No adnexal masses. Other: Small volume ascites.  No pneumoperitoneum. Musculoskeletal: Permeative metastatic lesion the left iliac wing not appreciably changed. No pathologic fracture. No new bony lesion identified. IMPRESSION: 1. Small-bowel obstruction with transition point in the anterior aspect of the lower abdomen, left of midline. This may be secondary to adhesions. 2. Small volume ascites. 3. Prior colon resection with left anterior abdominal wall colostomy. Adjacent parastomal hernia. Small volume fluid within the hernia sac. 4. Stable pulmonary metastatic disease. 5. Stable metastatic lesion of the left iliac wing. 6. Enlarged right external iliac chain lymph node measuring 1.4 cm, previously 1.3 cm. Several mildly prominent retroperitoneal lymph nodes have increased in size from prior but remain subcentimeter in size. 7. Aortic atherosclerosis (ICD10-I70.0). Electronically Signed   By: Duanne Guess D.O.   On: 09/16/2023 14:07

## 2023-12-04 NOTE — Assessment & Plan Note (Signed)
 History of PE and bilateral lower extremity DVT  05/18/23 Repeat right LE US showed chronic non occlusive DVT Continue  Eliquis 2.5 mg twice daily for anticoagulation prophylaxis. Recommend leg elevation and compression stocking.

## 2023-12-04 NOTE — Assessment & Plan Note (Signed)
 Hb stable, observation

## 2023-12-04 NOTE — Progress Notes (Signed)
Pt here for follow up. Pt reports that she has been having pain to right shoulder. Pt has had approx 8 pound weight loss since last visit and believes the extra weight was fluid accumulation.

## 2023-12-04 NOTE — Patient Instructions (Addendum)
Thank you for coming to the office today.  BP elevated  previously now better back on Triamterene-hydrochlorothiazide  RE order today since it is helping manage BP and fluid swelling.  For the skin issue, the area appears more scar tissue and fibrosed with some superficial ulcer and I think the nerve endings are the problem. Not yeast or other issue.  Use topical Kenalog cream to help this area. Keep on goal of keeping it dry and clean., avoiding friction can help.   Please schedule a Follow-up Appointment to: Return if symptoms worsen or fail to improve.  If you have any other questions or concerns, please feel free to call the office or send a message through MyChart. You may also schedule an earlier appointment if necessary.  Additionally, you may be receiving a survey about your experience at our office within a few days to 1 week by e-mail or mail. We value your feedback.  Saralyn Pilar, DO [facility] [place], [place]

## 2023-12-05 ENCOUNTER — Ambulatory Visit: Payer: 59 | Admitting: Family Medicine

## 2023-12-05 ENCOUNTER — Telehealth: Payer: Self-pay

## 2023-12-05 ENCOUNTER — Other Ambulatory Visit: Payer: Self-pay

## 2023-12-05 ENCOUNTER — Other Ambulatory Visit (HOSPITAL_COMMUNITY): Payer: Self-pay

## 2023-12-05 ENCOUNTER — Telehealth: Payer: Self-pay | Admitting: Pharmacy Technician

## 2023-12-05 ENCOUNTER — Telehealth: Payer: Self-pay | Admitting: *Deleted

## 2023-12-05 ENCOUNTER — Other Ambulatory Visit: Payer: Self-pay | Admitting: Pharmacy Technician

## 2023-12-05 ENCOUNTER — Encounter: Payer: Self-pay | Admitting: Oncology

## 2023-12-05 DIAGNOSIS — C799 Secondary malignant neoplasm of unspecified site: Secondary | ICD-10-CM

## 2023-12-05 DIAGNOSIS — C20 Malignant neoplasm of rectum: Secondary | ICD-10-CM

## 2023-12-05 LAB — CEA: CEA: 9064 ng/mL — ABNORMAL HIGH (ref 0.0–4.7)

## 2023-12-05 MED ORDER — TRIFLURIDINE-TIPIRACIL 20-8.19 MG PO TABS
35.0000 mg/m2 | ORAL_TABLET | Freq: Two times a day (BID) | ORAL | 1 refills | Status: DC
  Filled 2023-12-05: qty 60, 28d supply, fill #0
  Filled 2024-01-01: qty 60, 28d supply, fill #1

## 2023-12-05 NOTE — Telephone Encounter (Signed)
Daughter called and said that they saw Cathie Hoops yest, and the patient progressed and she is not getting iv chemo anymore and she will be on oral med . The daughter sees appts for 12/10/2023 and she wants to know what is going on.

## 2023-12-05 NOTE — Progress Notes (Signed)
Specialty Pharmacy Initiation Note   Jacqueline Yoder is a 78 y.o. female who will be followed by the specialty pharmacy service for RxSp Oncology    Review of administration, indication, effectiveness, safety, potential side effects, storage/disposable, and missed dose instructions occurred today for patient's specialty medication(s) Trifluridine-Tipiracil (LONSURF)     Patient/Caregiver did not have any additional questions or concerns.   Patient's therapy is appropriate to: Initiate    Goals Addressed             This Visit's Progress    Slow Disease Progression       Patient is initiating therapy. Patient will maintain adherence         Remi Haggard Specialty Pharmacist

## 2023-12-05 NOTE — Progress Notes (Signed)
Specialty Pharmacy Initial Fill Coordination Note  Jacqueline Yoder is a 78 y.o. female contacted today regarding refills of specialty medication(s) Trifluridine-Tipiracil (LONSURF) .  Patient requested Delivery  on 12/10/23  to verified address [address] [place] [address]   Medication will be filled on 02/21.   Patient is aware of $0 copayment.   Patty Almedia Balls, CPhT Oncology Pharmacy Patient Advocate [facility] Direct Number: [phone] Fax: [phone]

## 2023-12-05 NOTE — Telephone Encounter (Signed)
Oral Oncology Patient Advocate Encounter  Prior Authorization for Cyndi Lennert has been approved.    PA# [id number] Effective dates: 12/05/23 through 10/15/2024  Patients co-pay is $0.    Ella Bodo, CPhT Oncology Pharmacy Patient Advocate [facility] Direct Number: [phone] Fax: [phone]

## 2023-12-05 NOTE — Telephone Encounter (Signed)
-----   Message from [place] E sent at 12/05/2023  8:36 AM EST ----- First appts are currently scheduled to make sure she has a chair in infusion. ----- Message ----- From: [name], MD Sent: 12/04/2023   7:30 PM EST To: Coralee Rud, RN; Paulita Fujita, CMA; #  Lawanna Kobus,  I plan to start this patient on Lonsurf 35mg /m2 BID in combination with Bevacizumab. Would you please check coverage?  Tentative state date is 2/27 when I see her for chemo evaluation. Date can be adjusted if needed.   Team, please let patient know that in addition to start on the oral chemo with lonsurf, I also recommend also give IV Bevacizumab which she tried back in 2022 and did well. I will explain more during her next chemo visit with me, tentatively on 2/27.   Thank you   Janyth Contes

## 2023-12-05 NOTE — Telephone Encounter (Signed)
SPoke to [place] and infomed her that Dr. Cathie Hoops plans to do IV bev in addition to oral Lonsurf. Informed her that Dr. Cathie Hoops will explain more at scheduled visit on 2/27, but she should expect call from pharmacy with information regarding Lonsurf. She verbalized uderstanding.

## 2023-12-05 NOTE — Telephone Encounter (Addendum)
Oral Oncology Pharmacist Encounter  Received new prescription for Lonsurf (trifluridine and tipiracil) for the treatment of rectal cancer in conjunction with bevacizumab, planned duration until disease progression or unacceptable drug toxicity. Tentative start date 12/13/23.   CMP/CBC from 12/04/23 assessed, no baseline dose adjustments required.  Current medication list in Epic reviewed, no DDIs with Lonsurf identified:  Evaluated chart and no patient barriers to medication adherence noted.   Prescription has been e-scribed to the [facility] for benefits analysis and approval.  Oral Oncology Clinic will continue to follow for insurance authorization, copayment issues, initial counseling and start date.  Jerry Caras, PharmD PGY2 Oncology Pharmacy Resident   12/05/2023 9:13 AM

## 2023-12-05 NOTE — Telephone Encounter (Signed)
Oral Oncology Patient Advocate Encounter   Received notification that prior authorization for Lonsurf is required.   PA submitted on 12/05/23 Key [id number] Status is pending     Patty Almedia Balls, CPhT Oncology Pharmacy Patient Advocate [facility] Direct Number: [phone] Fax: [phone]

## 2023-12-06 ENCOUNTER — Telehealth: Payer: Self-pay

## 2023-12-06 ENCOUNTER — Inpatient Hospital Stay: Payer: 59

## 2023-12-06 NOTE — Telephone Encounter (Signed)
Copied from CRM [id number]. Topic: General - Other >> Dec 06, 2023 11:17 AM Elle L wrote: Reason for CRM: [facility] Case Management RN Crystal wanted to make Dr. Althea Charon aware that they will be contacting the patient every 6 months for care management and will contact him with updates on the patient as needed. Their number is [phone] if needed.

## 2023-12-07 ENCOUNTER — Other Ambulatory Visit: Payer: Self-pay

## 2023-12-10 ENCOUNTER — Other Ambulatory Visit: Payer: Self-pay | Admitting: Family Medicine

## 2023-12-10 DIAGNOSIS — Z1231 Encounter for screening mammogram for malignant neoplasm of breast: Secondary | ICD-10-CM

## 2023-12-13 ENCOUNTER — Encounter: Payer: Self-pay | Admitting: Oncology

## 2023-12-13 ENCOUNTER — Inpatient Hospital Stay: Payer: 59 | Admitting: Pharmacist

## 2023-12-13 ENCOUNTER — Inpatient Hospital Stay: Payer: 59 | Admitting: Oncology

## 2023-12-13 ENCOUNTER — Inpatient Hospital Stay: Payer: 59

## 2023-12-13 VITALS — BP 139/68 | HR 86 | Temp 98.1°F | Resp 19

## 2023-12-13 VITALS — BP 119/64 | HR 100 | Temp 97.2°F | Resp 18 | Wt 160.0 lb

## 2023-12-13 DIAGNOSIS — C20 Malignant neoplasm of rectum: Secondary | ICD-10-CM

## 2023-12-13 DIAGNOSIS — C799 Secondary malignant neoplasm of unspecified site: Secondary | ICD-10-CM

## 2023-12-13 DIAGNOSIS — K435 Parastomal hernia without obstruction or  gangrene: Secondary | ICD-10-CM | POA: Diagnosis not present

## 2023-12-13 DIAGNOSIS — E7800 Pure hypercholesterolemia, unspecified: Secondary | ICD-10-CM | POA: Diagnosis not present

## 2023-12-13 DIAGNOSIS — K449 Diaphragmatic hernia without obstruction or gangrene: Secondary | ICD-10-CM | POA: Diagnosis not present

## 2023-12-13 DIAGNOSIS — K521 Toxic gastroenteritis and colitis: Secondary | ICD-10-CM | POA: Diagnosis not present

## 2023-12-13 DIAGNOSIS — T451X5A Adverse effect of antineoplastic and immunosuppressive drugs, initial encounter: Secondary | ICD-10-CM | POA: Diagnosis not present

## 2023-12-13 DIAGNOSIS — I82501 Chronic embolism and thrombosis of unspecified deep veins of right lower extremity: Secondary | ICD-10-CM | POA: Diagnosis not present

## 2023-12-13 DIAGNOSIS — Z86711 Personal history of pulmonary embolism: Secondary | ICD-10-CM | POA: Diagnosis not present

## 2023-12-13 DIAGNOSIS — D6481 Anemia due to antineoplastic chemotherapy: Secondary | ICD-10-CM

## 2023-12-13 DIAGNOSIS — Z5111 Encounter for antineoplastic chemotherapy: Secondary | ICD-10-CM

## 2023-12-13 DIAGNOSIS — M25511 Pain in right shoulder: Secondary | ICD-10-CM | POA: Diagnosis not present

## 2023-12-13 DIAGNOSIS — I10 Essential (primary) hypertension: Secondary | ICD-10-CM | POA: Diagnosis not present

## 2023-12-13 DIAGNOSIS — Z933 Colostomy status: Secondary | ICD-10-CM | POA: Diagnosis not present

## 2023-12-13 DIAGNOSIS — N281 Cyst of kidney, acquired: Secondary | ICD-10-CM | POA: Diagnosis not present

## 2023-12-13 DIAGNOSIS — R188 Other ascites: Secondary | ICD-10-CM | POA: Diagnosis not present

## 2023-12-13 DIAGNOSIS — K565 Intestinal adhesions [bands], unspecified as to partial versus complete obstruction: Secondary | ICD-10-CM | POA: Diagnosis not present

## 2023-12-13 DIAGNOSIS — Z9221 Personal history of antineoplastic chemotherapy: Secondary | ICD-10-CM | POA: Diagnosis not present

## 2023-12-13 DIAGNOSIS — M47814 Spondylosis without myelopathy or radiculopathy, thoracic region: Secondary | ICD-10-CM | POA: Diagnosis not present

## 2023-12-13 DIAGNOSIS — C7951 Secondary malignant neoplasm of bone: Secondary | ICD-10-CM | POA: Diagnosis not present

## 2023-12-13 DIAGNOSIS — G629 Polyneuropathy, unspecified: Secondary | ICD-10-CM | POA: Diagnosis not present

## 2023-12-13 DIAGNOSIS — J90 Pleural effusion, not elsewhere classified: Secondary | ICD-10-CM | POA: Diagnosis not present

## 2023-12-13 DIAGNOSIS — I2510 Atherosclerotic heart disease of native coronary artery without angina pectoris: Secondary | ICD-10-CM | POA: Diagnosis not present

## 2023-12-13 DIAGNOSIS — I708 Atherosclerosis of other arteries: Secondary | ICD-10-CM | POA: Diagnosis not present

## 2023-12-13 DIAGNOSIS — R634 Abnormal weight loss: Secondary | ICD-10-CM | POA: Diagnosis not present

## 2023-12-13 DIAGNOSIS — K219 Gastro-esophageal reflux disease without esophagitis: Secondary | ICD-10-CM | POA: Diagnosis not present

## 2023-12-13 DIAGNOSIS — G620 Drug-induced polyneuropathy: Secondary | ICD-10-CM | POA: Diagnosis not present

## 2023-12-13 DIAGNOSIS — Z923 Personal history of irradiation: Secondary | ICD-10-CM | POA: Diagnosis not present

## 2023-12-13 LAB — CBC WITH DIFFERENTIAL (CANCER CENTER ONLY)
Abs Immature Granulocytes: 0.03 10*3/uL (ref 0.00–0.07)
Basophils Absolute: 0 10*3/uL (ref 0.0–0.1)
Basophils Relative: 0 %
Eosinophils Absolute: 0.1 10*3/uL (ref 0.0–0.5)
Eosinophils Relative: 2 %
HCT: 34.5 % — ABNORMAL LOW (ref 36.0–46.0)
Hemoglobin: 10.9 g/dL — ABNORMAL LOW (ref 12.0–15.0)
Immature Granulocytes: 1 %
Lymphocytes Relative: 19 %
Lymphs Abs: 1.1 10*3/uL (ref 0.7–4.0)
MCH: 30.1 pg (ref 26.0–34.0)
MCHC: 31.6 g/dL (ref 30.0–36.0)
MCV: 95.3 fL (ref 80.0–100.0)
Monocytes Absolute: 0.7 10*3/uL (ref 0.1–1.0)
Monocytes Relative: 12 %
Neutro Abs: 3.7 10*3/uL (ref 1.7–7.7)
Neutrophils Relative %: 66 %
Platelet Count: 230 10*3/uL (ref 150–400)
RBC: 3.62 MIL/uL — ABNORMAL LOW (ref 3.87–5.11)
RDW: 17.9 % — ABNORMAL HIGH (ref 11.5–15.5)
WBC Count: 5.6 10*3/uL (ref 4.0–10.5)
nRBC: 0 % (ref 0.0–0.2)

## 2023-12-13 LAB — CMP (CANCER CENTER ONLY)
ALT: 27 U/L (ref 0–44)
AST: 50 U/L — ABNORMAL HIGH (ref 15–41)
Albumin: 3.5 g/dL (ref 3.5–5.0)
Alkaline Phosphatase: 75 U/L (ref 38–126)
Anion gap: 9 (ref 5–15)
BUN: 16 mg/dL (ref 8–23)
CO2: 28 mmol/L (ref 22–32)
Calcium: 9.5 mg/dL (ref 8.9–10.3)
Chloride: 101 mmol/L (ref 98–111)
Creatinine: 0.77 mg/dL (ref 0.44–1.00)
GFR, Estimated: >60 mL/min (ref >60)
Glucose, Bld: 113 mg/dL — ABNORMAL HIGH (ref 70–99)
Potassium: 3.9 mmol/L (ref 3.5–5.1)
Sodium: 138 mmol/L (ref 135–145)
Total Bilirubin: 0.5 mg/dL (ref 0.0–1.2)
Total Protein: 7.2 g/dL (ref 6.5–8.1)

## 2023-12-13 LAB — TOTAL PROTEIN, URINE DIPSTICK: Protein, ur: NEGATIVE mg/dL

## 2023-12-13 MED ORDER — PROCHLORPERAZINE MALEATE 10 MG PO TABS: 10.0000 mg | ORAL_TABLET | Freq: Four times a day (QID) | ORAL | 1 refills | Status: DC | PRN

## 2023-12-13 MED ORDER — SODIUM CHLORIDE 0.9 % IV SOLN
5.0000 mg/kg | Freq: Once | INTRAVENOUS | Status: AC
  Administered 2023-12-13: 400 mg via INTRAVENOUS
  Filled 2023-12-13: qty 16

## 2023-12-13 MED ORDER — HEPARIN SOD (PORK) LOCK FLUSH 100 UNIT/ML IV SOLN
500.0000 [IU] | Freq: Once | INTRAVENOUS | Status: AC | PRN
  Administered 2023-12-13: 500 [IU]
  Filled 2023-12-13: qty 5

## 2023-12-13 MED ORDER — SODIUM CHLORIDE 0.9 % IV SOLN
INTRAVENOUS | Status: DC
Start: 2023-12-13 — End: 2023-12-13
  Filled 2023-12-13: qty 250

## 2023-12-13 MED ORDER — ONDANSETRON HCL 8 MG PO TABS
8.0000 mg | ORAL_TABLET | Freq: Three times a day (TID) | ORAL | 1 refills | Status: DC | PRN
Start: 2023-12-13 — End: 2024-04-04

## 2023-12-13 NOTE — Assessment & Plan Note (Signed)
 continue lomotil  QID PRN as instructed.  She has not needed to take antidiarrhea.

## 2023-12-13 NOTE — Assessment & Plan Note (Signed)
 History of PE and bilateral lower extremity DVT  05/18/23 Repeat right LE US showed chronic non occlusive DVT Continue  Eliquis 2.5 mg twice daily for anticoagulation prophylaxis. Recommend leg elevation and compression stocking.

## 2023-12-13 NOTE — Progress Notes (Signed)
 Clinical Pharmacist Practitioner Clinic [facility]  [phone] [phone]  Patient Care Team: Smitty Cords, DO as PCP - General (Family Medicine) Benita Gutter, RN as Registered Nurse [name], MD as Consulting Physician (Hematology and Oncology) Earna Coder, MD as Consulting Physician (Oncology)   Name of the patient: Jacqueline Yoder  [id number]  03/28/1946   Date of visit: 12/13/23  HPI: Patient is a 78 y.o. female with rectal cancer starting treatment with Lonsurf (trifluridine and tipiracil) in conjunction with bevacizumab.   Reason for Consult: Lonsurf oral chemotherapy education.   PAST MEDICAL HISTORY: Past Medical History:  Diagnosis Date   Allergy    Arthritis    Blood clot in vein    Family history of colon cancer    GERD (gastroesophageal reflux disease)    Hypercholesteremia    Hypertension    Hypertension    Lower extremity edema    Personal history of chemotherapy    Rectal cancer (HCC) 12/2018   Urinary incontinence     HEMATOLOGY/ONCOLOGY HISTORY:  Oncology History  Rectal cancer (HCC)  01/23/2019 Initial Diagnosis   Rectal cancer (HCC) Patient initially presented with complaints of postmenopausal bleeding on 08/16/2018.  History of was menopausal vaginal bleeding in 2016 which resulted in cervical polypectomy.  Pathology 02/04/2015 showed cervical polyp, consistent with benign endometrial polyp.  [name] after polypectomy due to anxiety associated with pelvic exams.  pelvic exam on 08/16/2018 reviewed cervical abnormality and from enlarged uterus. Seen by Dr. [name] on 10/29/2018.  Endometrial biopsy and a Pap smear was performed. 10/29/2018 Pap smear showed adenocarcinoma, favor endometrial origin. 10/29/2018 endometrial biopsy showed endometrioid carcinoma, FIGO grade 1.  10/29/2018- TA & TV Ultrasound revealed: Anteverted uterus measuring 8.7 x 5.6 x 6.4 cm without evidence of focal  masses.  The endometrium measuring 24.1 mm (thickened) and heterogeneous.  Right and left ovaries not visualized.  No adnexal masses identified.  No free fluid in cul-de-sac.  Patient was seen by Dr. [name] in clinic on 11/13/2018.  Cervical exam reveals 2 cm exophytic irregular mass consistent with malignancy.    11/19/2018 CT chest abdomen pelvis with contrast showed thickened endometrium with some irregularity compatible with the provided diagnosis of endometrial malignancy.  There is a mildly prominent left inguinal node 1.4 cm.  Patient was seen by Dr. [name] on 11/20/2018 and left groin lymph node biopsy was recommended.  11/26/2018 patient underwent left inguinal lymph node biopsy. Pathology showed metastatic adenocarcinoma consistent with colorectal origin.  CDX 2+.  Case was discussed on tumor board.  Recommend colonoscopy for further evaluation.  Patient reports significant weight loss 30 pounds over the last year.  Chronic vaginal spotting. Change of bowel habits the past few months.  More constipated.  Family history positive for brother who has colon cancer prostate cancer.  patient has underwent colonoscopy on 12/03/2018 which reviewed a nonobstructing large mass in the rectum.  Also chronic fistula.  Mass was not circumferential.  This was biopsied with a cold forceps for histology.  Pathology came back hyperplastic polyp negative for dysplasia and malignancy. [name], patient underwent flex sigmoidoscopy on 12/06/2018 with rebiopsy of the rectal mass. This time biopsy results came back positive for invasive colorectal adenocarcinoma, moderately differentiated. Immunotherapy for nearly mismatch repair protein (MMR ) was performed.  There is no loss of MMR expression.  low probability of MSI high.   # Seen by [name] for evaluation of resectability  for rectal cancer. In addition, she also had a second opinion with [facility] pathology where her  endometrial biopsy pathology was changed to  adenocarcinoma, consistent with colorectal primary.   [name]. She has home health that has been assisting with ostomy care  Patient was also evaluated by [facility] oncology.  Recommendation is to proceed with TNT with concurrent chemoradiation followed by neoadjuvant chemotherapy followed by surgical resection. Patient prefers to have treatment done locally with [facility].   # Oncology Treatment:  02/03/2019- 03/19/2019  concurrent Xeloda and radiation.  Xeloda dose 825mg  /m2 BID - rounded to 1650mg  BID- on days of radiation. 04/09/2019, started on FOLFOX with bolus early.  Omitted.  07/16/2019 finished 8 cycles of FOLFOX. 09/17/19 APR/posterior vaginectomy/TAH/BSO/VY-flap pT4b pN0 with close vaginal margin 0.2 mm.  Uterus and ovaries negative for malignancy.   10/06/2019 Cancer Staging   Staging form: Colon and Rectum, AJCC 8th Edition - Pathologic stage from 10/06/2019: Stage IIC (ypT4b, pN0, cM0) - Signed by [name], MD on 10/06/2019   06/30/2020 Cancer Staging   Staging form: Colon and Rectum, AJCC 8th Edition - Clinical stage from 06/30/2020: [name] - Signed by [name], MD on 04/17/2022 Stage prefix: Recurrence Total positive nodes: 1   06/30/2020 Relapse/Recurrence   vaginal introitus mass biopsied. Pathology is consistent with metastatic colorectal adenocarcinoma    07/20/2020 - 09/13/2020 Chemotherapy   Concurrent chemotherapy with Xeloda with Radiation.  08/02/2020-08/16/2020, . Xeloda was held due to neutropenia    08/17/2020 - 09/06/2020 Radiation Therapy   Xeloda 1500 mg twice daily concurrently with radiation    01/31/2021 Cancer Staging   Staging form: Colon and Rectum, AJCC 8th Edition - Pathologic: Stage Unknown (rpTX, pNX, cM1) - Signed by [name], MD on 01/31/2021 Stage prefix: Recurrence   01/31/2021 - 08/20/2021 Chemotherapy   FOLFIRI + Bev  Irinotecan dose was reduced, eventually 100mg /m2   Irinotecan was dropped in November 2022 due to side effects. Negative for UGT1A1*28    09/13/2021 -  Chemotherapy   maintenance 5-FU/bevacizumab    11/28/2021 -  Chemotherapy   5-FU/Irinotecan/bevacizumab. Irinotecan 100 mg/m2 was added back due to progressively increasing CEA.    07/14/2022 Imaging   Bone scan  1. No evidence of skeletal metastatic disease in the hips. 2. Asymmetric increased activity in the RIGHT humeral head is favored degenerative as described above. If pain in the RIGHT shoulder consider further evaluation with plain film or MRI   07/17/2022 Imaging   1. Stable scattered small bilateral pulmonary nodules, largest 8 by 6 mm in the right middle lobe. 2. Subtle chronically stable posterior cortical irregularity and heterogeneity in the left iliac bone, not appreciable on the bone scan of 07/14/2022. This has been present at least over the past year and could reflect radiation therapy related findings although strictly speaking, malignant involvement of the left iliac bone is difficult to exclude. If clinically warranted this could be further assessed with nuclear medicine PET-CT or MRI of the bony pelvis with and without contrast. 3. Pelvic floor laxity with loops of small bowel extending 3 cm below the pubococcygeal line. 4. Aortic and systemic atherosclerosis as detailed above. Aortic Atherosclerosis    12/26/2022 Imaging   CT chest abdomen pelvis w contrast 1. Stable scattered solid pulmonary nodules. 2. Right inguinal and right pelvic lymph nodes which were hypermetabolic on prior PET-CT are stable. 3. Osseous lesion of the proximal right humerus demonstrates increased sclerosis when compared with the prior PET-CT, possibly due to  treatment response. 4. 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 5. Aortic Atherosclerosis     06/25/2023 Imaging   CT chest abdomen pelvis w contrast showed 1. Increased size of the solid bilateral pulmonary nodules,consistent  with worsening metastatic disease. 2. Increased size of the right external iliac and inguinal lymph nodes, concerning for worsening metastatic disease. 3. No significant interval change in the left iliac bone osseous metastasis. 4. Enlarged main pulmonary artery measuring 3.6 cm, which can be seen in the setting of pulmonary arterial hypertension. 5.  Aortic Atherosclerosis (ICD10-I70.0)     Miscellaneous   Tempus liquid biopsy showed no actionable variants.  NOTCH1 missense variant, BRCA2 missense variant , MYC missense variant   07/11/2023 Imaging   PET showed 1. Multifocal tracer avid abdominopelvic lymph nodes are identified compatible with metastatic disease. 2. Multifocal bilateral pulmonary nodules are identified compatible with pulmonary metastasis.  3. Diffuse increased radiotracer uptake throughout the marrow space is identified diminishing sensitivity for detecting osseous metastasis. No abnormal increased radiotracer uptake identified above background increased bone marrow activity to indicate metabolically active bone metastases. 4.  Aortic Atherosclerosis   07/23/2023 - 11/13/2023 Chemotherapy   switch to dose reduced FOLFOX    09/16/2023 Imaging   CT abdomen pelvis w contrast  1. Small-bowel obstruction with transition point in the anterior aspect of the lower abdomen, left of midline. This may be secondary to adhesions. 2. Small volume ascites. 3. Prior colon resection with left anterior abdominal wall colostomy. Adjacent parastomal hernia. Small volume fluid within the hernia sac. 4. Stable pulmonary metastatic disease. 5. Stable metastatic lesion of the left iliac wing. 6. Enlarged right external iliac chain lymph node measuring 1.4 cm, previously 1.3 cm. Several mildly prominent retroperitoneal lymph nodes have increased in size from prior but remain subcentimeter in size. 7. Aortic atherosclerosis (ICD10-I70.0).   09/16/2023 - 09/20/2023 Hospital Admission    Hospitalized due to Small bowel obstruction due to adhesions    09/24/2023 Imaging   1. Bilateral pulmonary metastases are all stable from 06/25/2023 chest CT. 2. No thoracic adenopathy. No new or progressive metastatic disease in the chest. 3. Trace bilateral posterior pleural effusions. 4. Two-vessel coronary atherosclerosis. 5. Dilated main pulmonary artery, suggesting pulmonary arterial hypertension. 6.  Aortic Atherosclerosis (ICD10-I70.0).   11/28/2023 Imaging   CT chest abdomen pelvis with contrast 1. Increased size of retroperitoneal, iliac side chain and inguinal lymph nodes, concerning for progressive nodal metastatic disease. 2. Numerous bilateral solid pulmonary nodules are again seen, some of which have increased in size and others are stable in size overall compatible with disease progression. 3. Increased size of the subcapsular lesion in the anterior inferior right lobe of the liver, which has been stable over multiple prior examinations and was not FDG avid on prior PET-CT. Suggest attention on follow-up imaging. 4. 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 5. Questionable wall thickening of matted loops of small bowel in the left lower quadrant, suggest attention on follow-up imaging. 6. Small volume abdominopelvic free fluid. 7.  Aortic Atherosclerosis (ICD10-I70.0).   12/13/2023 -  Chemotherapy   Patient is on Treatment Plan : COLORECTAL Bevacizumab + Trifluridine/Tipiracil q28d     Metastatic adenocarcinoma (HCC)  12/13/2023 -  Chemotherapy   Patient is on Treatment Plan : COLORECTAL Bevacizumab + Trifluridine/Tipiracil q28d       ALLERGIES:  is allergic to sulfamethoxazole-trimethoprim.  MEDICATIONS:  Current Outpatient Medications  Medication Sig Dispense Refill   acetaminophen (TYLENOL) 650  MG CR tablet Take 650 mg by mouth every 8 (eight) hours as needed for pain.     Cholecalciferol (VITAMIN D3) 2000 units  capsule Take 2,000 Units by mouth daily.     clindamycin (CLINDAGEL) 1 % gel Apply topically 2 (two) times daily. 30 g 5   diclofenac sodium (VOLTAREN) 1 % GEL Apply 2 g topically 4 (four) times daily as needed (joint pain).  11   diphenoxylate-atropine (LOMOTIL) 2.5-0.025 MG tablet Take 1 tablet by mouth 4 (four) times daily as needed for diarrhea or loose stools. 120 tablet 0   ELIQUIS 2.5 MG TABS tablet TAKE 1 TABLET BY MOUTH TWICE  DAILY 200 tablet 2   fluticasone (FLONASE) 50 MCG/ACT nasal spray Place 1 spray into both nostrils daily as needed for allergies or rhinitis.     gentamicin cream (GARAMYCIN) 0.1 % Apply to affected toe once daily. 30 g 1   ketoconazole (NIZORAL) 2 % cream Apply 1 Application topically daily. For up to 2 weeks. May repeat if need. 30 g 2   lidocaine-prilocaine (EMLA) cream Apply 1 Application topically daily as needed. Apply small amount to port and cover with saran wrap 1-2 hours prior to port access 30 g 3   loratadine (CLARITIN) 10 MG tablet Take 10 mg by mouth daily.     magnesium chloride (SLOW-MAG) 64 MG TBEC SR tablet Take 1 tablet (64 mg total) by mouth 2 (two) times daily. 60 tablet 2   Multiple Vitamins-Minerals (ONE-A-DAY WOMENS 50 PLUS PO) Take 1 tablet by mouth daily.      nystatin (MYCOSTATIN/NYSTOP) powder APPLY 1 POWDER TOPICALLY THREE TIMES DAILY 45 g 0   potassium chloride SA (KLOR-CON M) 20 MEQ tablet TAKE 1 TABLET BY MOUTH DAILY 100 tablet 2   simvastatin (ZOCOR) 40 MG tablet Take 1 tablet (40 mg total) by mouth at bedtime. 100 tablet 3   triamcinolone cream (KENALOG) 0.5 % Apply 1 Application topically 2 (two) times daily. 90 g 1   triamterene-hydrochlorothiazide (DYAZIDE) 37.5-25 MG capsule Take 1 each (1 capsule total) by mouth daily. 90 capsule 1   trifluridine-tipiracil (LONSURF) 20-8.19 MG tablet Take 3 tablets (60 mg of trifluridine total) by mouth 2 (two) times daily. 1hr after AM & PM meals days 1-5, 8-12. Repeat every 28d. (Patient not  taking: Reported on 12/13/2023) 60 tablet 1   zinc gluconate 50 MG tablet Take 50 mg by mouth daily.     No current facility-administered medications for this visit.   Facility-Administered Medications Ordered in Other Visits  Medication Dose Route Frequency Provider Last Rate Last Admin   0.9 %  sodium chloride infusion   Intravenous Continuous [name], MD 10 mL/hr at 12/13/23 0929 New Bag at 12/13/23 0929   bevacizumab-awwb (MVASI) 400 mg in sodium chloride 0.9 % 100 mL chemo infusion  5 mg/kg (Treatment Plan Recorded) Intravenous Once [name], MD       heparin lock flush 100 UNIT/ML injection             VITAL SIGNS: There were no vitals taken for this visit. There were no vitals filed for this visit.  Estimated body mass index is 27.46 kg/m as calculated from the following:   Height as of 12/04/23: 5\' 4"  (1.626 m).   Weight as of an earlier encounter on 12/13/23: 72.6 kg (160 lb).  LABS: CBC:    Component Value Date/Time   WBC 5.6 12/13/2023 0825   WBC 3.3 (L) 09/20/2023 0426   HGB  10.9 (L) 12/13/2023 0825   HCT 34.5 (L) 12/13/2023 0825   PLT 230 12/13/2023 0825   MCV 95.3 12/13/2023 0825   NEUTROABS 3.7 12/13/2023 0825   LYMPHSABS 1.1 12/13/2023 0825   MONOABS 0.7 12/13/2023 0825   EOSABS 0.1 12/13/2023 0825   BASOSABS 0.0 12/13/2023 0825   Comprehensive Metabolic Panel:    Component Value Date/Time   NA 138 12/13/2023 0825   K 3.9 12/13/2023 0825   CL 101 12/13/2023 0825   CO2 28 12/13/2023 0825   BUN 16 12/13/2023 0825   CREATININE 0.77 12/13/2023 0825   CREATININE 1.10 (H) 06/24/2021 0802   GLUCOSE 113 (H) 12/13/2023 0825   CALCIUM 9.5 12/13/2023 0825   AST 50 (H) 12/13/2023 0825   ALT 27 12/13/2023 0825   ALKPHOS 75 12/13/2023 0825   BILITOT 0.5 12/13/2023 0825   PROT 7.2 12/13/2023 0825   ALBUMIN 3.5 12/13/2023 0825     Present during today's visit: Patient and her daughter seen in infusion   Start plan: Patient brought her medication with her  today. She will take first dose of Lonsurf today in infusion with a snack. Plan communicated with RN.    Patient Education I spoke with patient for overview of new oral chemotherapy medication: Lonsurf   Administration: Counseled patient on administration, dosing, side effects, monitoring, drug-food interactions, safe handling, storage, and disposal. Patient will take 3 tablets (60 mg of trifluridine total) by mouth 2 (two) times daily. 1hr after AM & PM meals days 1-5, 8-12. Repeat every 28d. .  Side Effects: Side effects include but not limited to: myelosuppression, fatigue, nausea/vomiting, or decreased appetite.    Drug-drug Interactions (DDI): No drug interactions with Lonsurf identified  Adherence: After discussion with patient no patient barriers to medication adherence identified.  Reviewed with patient importance of keeping a medication schedule and plan for any missed doses.  She voiced understanding and appreciation. All questions answered. Medication handout provided.  Provided patient with Oral Chemotherapy Navigation Clinic phone number. Patient knows to call the office with questions or concerns. Oral Chemotherapy Navigation Clinic will continue to follow.  Patient expressed understanding and was in agreement with this plan. She also understands that She can call clinic at any time with any questions, concerns, or complaints.   Medication Access Issues: None, fills at [facility]  Follow-up plan: RTC in 2 weeks for MD visit   Thank you for allowing me to participate in the care of this patient.   Time Total: 15 min   Visit consisted of counseling and education on dealing with issues of symptom management in the setting of serious and potentially life-threatening illness.Greater than 50%  of this time was spent counseling and coordinating care related to the above assessment and plan.  Signed by: Jerry Caras, PharmD PGY2 Oncology Pharmacy Resident   12/13/2023  10:10 AM

## 2023-12-13 NOTE — Assessment & Plan Note (Signed)
 Chemotherapy plan as listed above

## 2023-12-13 NOTE — Assessment & Plan Note (Signed)
 Hb stable, observation

## 2023-12-13 NOTE — Assessment & Plan Note (Addendum)
 History of stage IIIC Rectal cancer, s/p TNT, followed by 09/17/19 APR/posterior vaginectomy/TAH/BSO/VY-flap, pT4b pN0 with close vaginal margin 0.2 mm.  Uterus and ovaries negative for malignancy. palliative radiation to vaginal recurrence- 01/19/21 recurrence with lung metastasis.-Palliative -FOLFIRI plus bevacizumab.  Irinotecan was dropped in November 2022 due to side effects. Negative for UGT1A1*28 - radiographically stable, rise of CEA-July 2023 CT lung metastasis worse--> Dec 2023 PET showed progression in pelvic lymph nodes and bone lesions,--> 2nd line irinotecan +panitumumab-->CT March 2024 stable. --> June 2024 CT showed mild lung progression/Progressive left iliac bone metastasis, vulvar mass difficult for biopsy in office,  Tempus Liquid biopsy - no actionable variants --> 06/25/2023 CT showed progression of lung nodules [SLD increase 71mm] and iliac inguinal lymphadenopathy--> 07/23/23 switch to dose reduced FOLFOX --> Dec 2024 CT- stable disease. --> Feb 2025 disease progression  Labs are reviewed and discussed with patient   CEA nadir was in 490, progressively increases - 7000s  Proceed with longsurf [60mg  BID day 1-5 and day 8- 12 every 28-day  + Bevacizumab D1 and 15 Rationale and side effects were reviewed with patient.

## 2023-12-13 NOTE — Progress Notes (Signed)
 Hematology/Oncology Progress note [phone]) [phone] Fax:([phone]) [phone]      CHIEF COMPLAINTS/REASON FOR VISIT:  Follow up for rectal cancer  ASSESSMENT & PLAN:   Cancer Staging  Rectal cancer [facility]) Staging form: Colon and Rectum, AJCC 8th Edition - Pathologic stage from 10/06/2019: Stage IIC (ypT4b, pN0, cM0) - Signed by [name], MD on 10/06/2019 - Clinical stage from 06/30/2020: [name] - Signed by [name], MD on 04/17/2022 - Pathologic: Stage Unknown (rpTX, pNX, cM1) - Signed by [name], MD on 01/31/2021   Rectal cancer [facility]) History of stage IIIC Rectal cancer, s/p TNT, followed by 09/17/19 APR/posterior vaginectomy/TAH/BSO/VY-flap, pT4b pN0 with close vaginal margin 0.2 mm.  Uterus and ovaries negative for malignancy. palliative radiation to vaginal recurrence- 01/19/21 recurrence with lung metastasis.-Palliative -FOLFIRI plus bevacizumab.  Irinotecan was dropped in November 2022 due to side effects. Negative for UGT1A1*28 - radiographically stable, rise of CEA-July 2023 CT lung metastasis worse--> Dec 2023 PET showed progression in pelvic lymph nodes and bone lesions,--> 2nd line irinotecan +panitumumab-->CT March 2024 stable. --> June 2024 CT showed mild lung progression/Progressive left iliac bone metastasis, vulvar mass difficult for biopsy in office,  Tempus Liquid biopsy - no actionable variants --> 06/25/2023 CT showed progression of lung nodules [SLD increase 91mm] and iliac inguinal lymphadenopathy--> 07/23/23 switch to dose reduced FOLFOX --> Dec 2024 CT- stable disease. --> Feb 2025 disease progression  Labs are reviewed and discussed with patient   CEA nadir was in 490, progressively increases - 7000s  Proceed with longsurf [60mg  BID day 1-5 and day 8- 12 every 28-day  + Bevacizumab D1 and 15 Rationale and side effects were reviewed with patient.    Chemotherapy-induced neuropathy (HCC) Stable symptoms, Grade 1-2.patient copes well.   Encounter for  antineoplastic chemotherapy Chemotherapy plan as listed above.   History of pulmonary embolism History of PE and bilateral lower extremity DVT  05/18/23 Repeat right LE US showed chronic non occlusive DVT Continue  Eliquis 2.5 mg twice daily for anticoagulation prophylaxis. Recommend leg elevation and compression stocking.   Anemia due to antineoplastic chemotherapy Hb stable, observation  Chemotherapy induced diarrhea continue lomotil  QID PRN as instructed.  She has not needed to take antidiarrhea.    Follow up 2 weeks.  All questions were answered. The patient knows to call the clinic with any problems, questions or concerns.  [name], MD, PhD [facility] Hematology Oncology 12/13/2023      HISTORY OF PRESENTING ILLNESS:  Oncology History  Rectal cancer [facility])  01/23/2019 Initial Diagnosis   Rectal cancer [facility]) Patient initially presented with complaints of postmenopausal bleeding on 08/16/2018.  History of was menopausal vaginal bleeding in 2016 which resulted in cervical polypectomy.  Pathology 02/04/2015 showed cervical polyp, consistent with benign endometrial polyp.  [name] after polypectomy due to anxiety associated with pelvic exams.  pelvic exam on 08/16/2018 reviewed cervical abnormality and from enlarged uterus. Seen by Dr. [name] on 10/29/2018.  Endometrial biopsy and a Pap smear was performed. 10/29/2018 Pap smear showed adenocarcinoma, favor endometrial origin. 10/29/2018 endometrial biopsy showed endometrioid carcinoma, FIGO grade 1.  10/29/2018- TA & TV Ultrasound revealed: Anteverted uterus measuring 8.7 x 5.6 x 6.4 cm without evidence of focal masses.  The endometrium measuring 24.1 mm (thickened) and heterogeneous.  Right and left ovaries not visualized.  No adnexal masses identified.  No free fluid in cul-de-sac.  Patient was seen by Dr. [name] in clinic on 11/13/2018.  Cervical exam reveals 2 cm exophytic irregular  mass consistent with malignancy.     11/19/2018 CT chest abdomen pelvis with contrast showed thickened endometrium with some irregularity compatible with the provided diagnosis of endometrial malignancy.  There is a mildly prominent left inguinal node 1.4 cm.  Patient was seen by Dr. [name] on 11/20/2018 and left groin lymph node biopsy was recommended.  11/26/2018 patient underwent left inguinal lymph node biopsy. Pathology showed metastatic adenocarcinoma consistent with colorectal origin.  CDX 2+.  Case was discussed on tumor board.  Recommend colonoscopy for further evaluation.  Patient reports significant weight loss 30 pounds over the last year.  Chronic vaginal spotting. Change of bowel habits the past few months.  More constipated.  Family history positive for brother who has colon cancer prostate cancer.  patient has underwent colonoscopy on 12/03/2018 which reviewed a nonobstructing large mass in the rectum.  Also chronic fistula.  Mass was not circumferential.  This was biopsied with a cold forceps for histology.  Pathology came back hyperplastic polyp negative for dysplasia and malignancy. [name], patient underwent flex sigmoidoscopy on 12/06/2018 with rebiopsy of the rectal mass. This time biopsy results came back positive for invasive colorectal adenocarcinoma, moderately differentiated. Immunotherapy for nearly mismatch repair protein (MMR ) was performed.  There is no loss of MMR expression.  low probability of MSI high.   # Seen by [name] for evaluation of resectability for rectal cancer. In addition, she also had a second opinion with [facility] pathology where her endometrial biopsy pathology was changed to  adenocarcinoma, consistent with colorectal primary.   [name]. She has home health that has been assisting with ostomy care  Patient was also evaluated by [facility] oncology.  Recommendation is to proceed with TNT with concurrent chemoradiation  followed by neoadjuvant chemotherapy followed by surgical resection. Patient prefers to have treatment done locally with [facility].   # Oncology Treatment:  02/03/2019- 03/19/2019  concurrent Xeloda and radiation.  Xeloda dose 825mg  /m2 BID - rounded to 1650mg  BID- on days of radiation. 04/09/2019, started on FOLFOX with bolus early.  Omitted.  07/16/2019 finished 8 cycles of FOLFOX. 09/17/19 APR/posterior vaginectomy/TAH/BSO/VY-flap pT4b pN0 with close vaginal margin 0.2 mm.  Uterus and ovaries negative for malignancy.   10/06/2019 Cancer Staging   Staging form: Colon and Rectum, AJCC 8th Edition - Pathologic stage from 10/06/2019: Stage IIC (ypT4b, pN0, cM0) - Signed by [name], MD on 10/06/2019   06/30/2020 Cancer Staging   Staging form: Colon and Rectum, AJCC 8th Edition - Clinical stage from 06/30/2020: [name] - Signed by [name], MD on 04/17/2022 Stage prefix: Recurrence Total positive nodes: 1   06/30/2020 Relapse/Recurrence   vaginal introitus mass biopsied. Pathology is consistent with metastatic colorectal adenocarcinoma    07/20/2020 - 09/13/2020 Chemotherapy   Concurrent chemotherapy with Xeloda with Radiation.  08/02/2020-08/16/2020, . Xeloda was held due to neutropenia    08/17/2020 - 09/06/2020 Radiation Therapy   Xeloda 1500 mg twice daily concurrently with radiation    01/31/2021 Cancer Staging   Staging form: Colon and Rectum, AJCC 8th Edition - Pathologic: Stage Unknown (rpTX, pNX, cM1) - Signed by [name], MD on 01/31/2021 Stage prefix: Recurrence   01/31/2021 - 08/20/2021 Chemotherapy   FOLFIRI + Bev  Irinotecan dose was reduced, eventually 100mg /m2  Irinotecan was dropped in November 2022 due to side effects. Negative for UGT1A1*28    09/13/2021 -  Chemotherapy   maintenance 5-FU/bevacizumab    11/28/2021 -  Chemotherapy  5-FU/Irinotecan/bevacizumab. Irinotecan 100 mg/m2 was added back due to progressively increasing CEA.    07/14/2022 Imaging   Bone scan   1. No evidence of skeletal metastatic disease in the hips. 2. Asymmetric increased activity in the RIGHT humeral head is favored degenerative as described above. If pain in the RIGHT shoulder consider further evaluation with plain film or MRI   07/17/2022 Imaging   1. Stable scattered small bilateral pulmonary nodules, largest 8 by 6 mm in the right middle lobe. 2. Subtle chronically stable posterior cortical irregularity and heterogeneity in the left iliac bone, not appreciable on the bone scan of 07/14/2022. This has been present at least over the past year and could reflect radiation therapy related findings although strictly speaking, malignant involvement of the left iliac bone is difficult to exclude. If clinically warranted this could be further assessed with nuclear medicine PET-CT or MRI of the bony pelvis with and without contrast. 3. Pelvic floor laxity with loops of small bowel extending 3 cm below the pubococcygeal line. 4. Aortic and systemic atherosclerosis as detailed above. Aortic Atherosclerosis    12/26/2022 Imaging   CT chest abdomen pelvis w contrast 1. Stable scattered solid pulmonary nodules. 2. Right inguinal and right pelvic lymph nodes which were hypermetabolic on prior PET-CT are stable. 3. Osseous lesion of the proximal right humerus demonstrates increased sclerosis when compared with the prior PET-CT, possibly due to treatment response. 4. 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 5. Aortic Atherosclerosis     06/25/2023 Imaging   CT chest abdomen pelvis w contrast showed 1. Increased size of the solid bilateral pulmonary nodules,consistent with worsening metastatic disease. 2. Increased size of the right external iliac and inguinal lymph nodes, concerning for worsening metastatic disease. 3. No significant interval change in the left iliac bone osseous metastasis. 4. Enlarged main pulmonary artery measuring 3.6 cm, which can be seen in the setting of  pulmonary arterial hypertension. 5.  Aortic Atherosclerosis (ICD10-I70.0)     Miscellaneous   Tempus liquid biopsy showed no actionable variants.  NOTCH1 missense variant, BRCA2 missense variant , MYC missense variant   07/11/2023 Imaging   PET showed 1. Multifocal tracer avid abdominopelvic lymph nodes are identified compatible with metastatic disease. 2. Multifocal bilateral pulmonary nodules are identified compatible with pulmonary metastasis.  3. Diffuse increased radiotracer uptake throughout the marrow space is identified diminishing sensitivity for detecting osseous metastasis. No abnormal increased radiotracer uptake identified above background increased bone marrow activity to indicate metabolically active bone metastases. 4.  Aortic Atherosclerosis   07/23/2023 - 11/13/2023 Chemotherapy   switch to dose reduced FOLFOX    09/16/2023 Imaging   CT abdomen pelvis w contrast  1. Small-bowel obstruction with transition point in the anterior aspect of the lower abdomen, left of midline. This may be secondary to adhesions. 2. Small volume ascites. 3. Prior colon resection with left anterior abdominal wall colostomy. Adjacent parastomal hernia. Small volume fluid within the hernia sac. 4. Stable pulmonary metastatic disease. 5. Stable metastatic lesion of the left iliac wing. 6. Enlarged right external iliac chain lymph node measuring 1.4 cm, previously 1.3 cm. Several mildly prominent retroperitoneal lymph nodes have increased in size from prior but remain subcentimeter in size. 7. Aortic atherosclerosis (ICD10-I70.0).   09/16/2023 - 09/20/2023 Hospital Admission   Hospitalized due to Small bowel obstruction due to adhesions    09/24/2023 Imaging   1. Bilateral pulmonary metastases are all stable from 06/25/2023 chest CT. 2. No thoracic adenopathy. No new or progressive  metastatic disease in the chest. 3. Trace bilateral posterior pleural effusions. 4. Two-vessel coronary  atherosclerosis. 5. Dilated main pulmonary artery, suggesting pulmonary arterial hypertension. 6.  Aortic Atherosclerosis (ICD10-I70.0).   11/28/2023 Imaging   CT chest abdomen pelvis with contrast 1. Increased size of retroperitoneal, iliac side chain and inguinal lymph nodes, concerning for progressive nodal metastatic disease. 2. Numerous bilateral solid pulmonary nodules are again seen, some of which have increased in size and others are stable in size overall compatible with disease progression. 3. Increased size of the subcapsular lesion in the anterior inferior right lobe of the liver, which has been stable over multiple prior examinations and was not FDG avid on prior PET-CT. Suggest attention on follow-up imaging. 4. 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 5. Questionable wall thickening of matted loops of small bowel in the left lower quadrant, suggest attention on follow-up imaging. 6. Small volume abdominopelvic free fluid. 7.  Aortic Atherosclerosis (ICD10-I70.0).   12/13/2023 -  Chemotherapy   Patient is on Treatment Plan : COLORECTAL Bevacizumab + Trifluridine/Tipiracil q28d     Metastatic adenocarcinoma (HCC)  12/13/2023 -  Chemotherapy   Patient is on Treatment Plan : COLORECTAL Bevacizumab + Trifluridine/Tipiracil q28d          # Oncology Treatment:  02/03/2019- 03/19/2019  concurrent Xeloda and radiation.  Xeloda dose 825mg  /m2 BID - rounded to 1650mg  BID- on days of radiation. 04/09/2019, started on FOLFOX with bolus early.  Omitted.  07/16/2019 finished 8 cycles of FOLFOX. 09/17/19 APR/posterior vaginectomy/TAH/BSO/VY-flap pT4b pN0 with close vaginal margin 0.2 mm.  Uterus and ovaries negative for malignancy. Patient reports bilateral lower extremity numbness and tingling, intermittent, left worse than right. She has lost a lot of weight since her APR surgery.   #Family history with half brother having's history of  colon cancer prostate cancer.  Personal history of colorectal cancer.  Patient has not decided if she wants genetic testing.    # history of PE( 01/13/2020)  in the bilateral lower extremity DVT (01/13/2020).   She finishes 6 months of anticoagulation with Eliquis 5 mg twice daily. Now switched to Eliquis 2.5 mg twice daily..  # She has now developed recurrent disease. #06/30/20  vaginal introitus mass biopsied. Pathology is consistent with metastatic colorectal adenocarcinoma I have discussed with [name]  Dr. Luciano Cutter and the mass is not resectable. Patient has also had colonoscopy by Dr. Tobi Bastos yesterday. Normal examination. # 07/16/2020 cycle 1 FOLFIRI  # 07/20/2020 PET scan was done for further evaluation, images are consistent with local recurrence, no distant metastasis. #Discussed with radiation oncology Dr. Rushie Chestnut will recommends concurrent chemotherapy and radiation. 08/02/2020-08/16/2020, patient starts radiation.  Xeloda was held due to neutropenia 08/17/2020,-09/06/2020 Xeloda 1500 mg twice daily concurrently with radiation  01/31/21 started on FOLFIRI + Bev 05/18/2021 CT chest abdomen pelvis showed Previously noted enlargement of bilateral inguinal lymph nodes is resolved, consistent with treatment response of nodal metastatic disease. Interval decrease in size of multiple small bilateral pulmonary nodules, consistent with treatment response of pulmonary metastatic disease. No evidence of new metastatic disease. 05/24/2021 - 08/30/2021, continued on FOLFIRI plus bevacizumab.  Irinotecan dose was reduced, eventually 100mg /m2  09/02/2021, CT chest abdomen pelvis without contrast Showed small bilateral pulmonary nodules, unchanged.  Stable metastatic disease.  No noncontrast evidence of new metastatic disease in the chest abdomen pelvis.  Small parastomal hernia.  Enlargement of main pulmonary artery.  Coronary artery disease.  09/13/2021, maintenance 5-FU/bevacizumab 11/28/2021,  5-FU/Irinotecan/bevacizumab.  Irinotecan 100 mg/m2 was added back due to progressively increasing CEA.  -right lower extremity edema, US showed non occlusive DVT, edema has improved.   INTERVAL HISTORY Jacqueline Yoder is a 78 y.o. female who has above history reviewed by me presents for follow-up of rectal cancer. On Irinotecan and panitumumab. she tolerates well.   She was accompanied by her daughter. + she reports manageable neuropathy symptoms of her fingertips, intermittent,  worse when exposed to cold temperature.  + chronic loose ostomy output, stable, not worse.  + Right shoulder pain. +  pound weight loss after started on Dyazide.   Review of Systems  Constitutional:  Positive for fatigue. Negative for appetite change, chills, fever and unexpected weight change.  HENT:   Negative for hearing loss and voice change.   Eyes:  Negative for eye problems.  Respiratory:  Negative for chest tightness and cough.   Cardiovascular:  Negative for chest pain.  Gastrointestinal:  Positive for diarrhea. Negative for abdominal distention, abdominal pain, blood in stool, constipation and nausea.  Endocrine: Negative for hot flashes.  Genitourinary:  Negative for difficulty urinating and frequency.   Musculoskeletal:  Positive for arthralgias.  Skin:  Negative for itching.  Neurological:  Positive for numbness. Negative for extremity weakness.  Hematological:  Negative for adenopathy.  Psychiatric/Behavioral:  Negative for confusion.     MEDICAL HISTORY:  Past Medical History:  Diagnosis Date   Allergy    Arthritis    Blood clot in vein    Family history of colon cancer    GERD (gastroesophageal reflux disease)    Hypercholesteremia    Hypertension    Hypertension    Lower extremity edema    Personal history of chemotherapy    Rectal cancer (HCC) 12/2018   Urinary incontinence     SURGICAL HISTORY: Past Surgical History:  Procedure Laterality Date   ABDOMINAL HYSTERECTOMY      CHOLECYSTECTOMY  1971   COLONOSCOPY WITH PROPOFOL N/A 12/03/2018   Procedure: COLONOSCOPY WITH PROPOFOL;  Surgeon: Midge Minium, MD;  Location: [facility] ENDOSCOPY;  Service: Endoscopy;  Laterality: N/A;   COLONOSCOPY WITH PROPOFOL N/A 07/15/2020   Procedure: COLONOSCOPY WITH PROPOFOL;  Surgeon: Wyline Mood, MD;  Location: [facility]COPY;  Service: Gastroenterology;  Laterality: N/A;   FLEXIBLE SIGMOIDOSCOPY N/A 12/06/2018   Procedure: FLEXIBLE SIGMOIDOSCOPY;  Surgeon: Wyline Mood, MD;  Location: [facility] ENDOSCOPY;  Service: Endoscopy;  Laterality: N/A;   LAPAROSCOPIC COLOSTOMY  01/06/2019   PORTACATH PLACEMENT N/A 04/03/2019   Procedure: INSERTION PORT-A-CATH;  Surgeon: Leafy Ro, MD;  Location: [facility] ORS;  Service: General;  Laterality: N/A;    SOCIAL HISTORY: Social History   Socioeconomic History   Marital status: Widowed    Spouse name: Not on file   Number of children: Not on file   Years of education: Not on file   Highest education level: Some college, no degree  Occupational History   Not on file  Tobacco Use   Smoking status: Former    Current packs/day: 0.00    Types: Cigarettes    Quit date: 12/02/1977    Years since quitting: 46.0   Smokeless tobacco: Former  Building services engineer status: Never Used  Substance and Sexual Activity   Alcohol use: Never   Drug use: Never   Sexual activity: Not Currently    Birth control/protection: None  Other Topics Concern   Not on file  Social History Narrative   Lives with daughter   Social Drivers of  Health   Financial Resource Strain: Low Risk  (11/15/2023)   Overall Financial Resource Strain (CARDIA)    Difficulty of Paying Living Expenses: Not hard at all  Food Insecurity: No Food Insecurity (11/15/2023)   Hunger Vital Sign    Worried About Running Out of Food in the Last Year: Never true    Ran Out of Food in the Last Year: Never true  Transportation Needs: No Transportation Needs (11/15/2023)   PRAPARE - Therapist, art (Medical): No    Lack of Transportation (Non-Medical): No  Physical Activity: Insufficiently Active (11/15/2023)   Exercise Vital Sign    Days of Exercise per Week: 3 days    Minutes of Exercise per Session: 10 min  Stress: No Stress Concern Present (11/15/2023)   [employer] of Occupational Health - Occupational Stress Questionnaire    Feeling of Stress : Not at all  Social Connections: Moderately Integrated (11/15/2023)   Social Connection and Isolation Panel [NHANES]    Frequency of Communication with Friends and Family: More than three times a week    Frequency of Social Gatherings with Friends and Family: More than three times a week    Attends Religious Services: More than 4 times per year    Active Member of [employer] or Organizations: Yes    Attends Banker Meetings: More than 4 times per year    Marital Status: Widowed  Intimate Partner Violence: Not At Risk (09/27/2023)   Humiliation, Afraid, Rape, and Kick questionnaire    Fear of Current or Ex-Partner: No    Emotionally Abused: No    Physically Abused: No    Sexually Abused: No    FAMILY HISTORY: Family History  Problem Relation Age of Onset   Colon cancer Brother 58       exposure to chemicals [place]   Hypertension Mother    Stroke Mother    Kidney failure Father    Breast cancer Neg Hx    Ovarian cancer Neg Hx     ALLERGIES:  is allergic to sulfamethoxazole-trimethoprim.  MEDICATIONS:  Current Outpatient Medications  Medication Sig Dispense Refill   acetaminophen (TYLENOL) 650 MG CR tablet Take 650 mg by mouth every 8 (eight) hours as needed for pain.     Cholecalciferol (VITAMIN D3) 2000 units capsule Take 2,000 Units by mouth daily.     clindamycin (CLINDAGEL) 1 % gel Apply topically 2 (two) times daily. 30 g 5   diclofenac sodium (VOLTAREN) 1 % GEL Apply 2 g topically 4 (four) times daily as needed (joint pain).  11   diphenoxylate-atropine (LOMOTIL) 2.5-0.025 MG  tablet Take 1 tablet by mouth 4 (four) times daily as needed for diarrhea or loose stools. 120 tablet 0   ELIQUIS 2.5 MG TABS tablet TAKE 1 TABLET BY MOUTH TWICE  DAILY 200 tablet 2   fluticasone (FLONASE) 50 MCG/ACT nasal spray Place 1 spray into both nostrils daily as needed for allergies or rhinitis.     gentamicin cream (GARAMYCIN) 0.1 % Apply to affected toe once daily. 30 g 1   ketoconazole (NIZORAL) 2 % cream Apply 1 Application topically daily. For up to 2 weeks. May repeat if need. 30 g 2   lidocaine-prilocaine (EMLA) cream Apply 1 Application topically daily as needed. Apply small amount to port and cover with saran wrap 1-2 hours prior to port access 30 g 3   loratadine (CLARITIN) 10 MG tablet Take 10 mg by mouth daily.  magnesium chloride (SLOW-MAG) 64 MG TBEC SR tablet Take 1 tablet (64 mg total) by mouth 2 (two) times daily. 60 tablet 2   Multiple Vitamins-Minerals (ONE-A-DAY WOMENS 50 PLUS PO) Take 1 tablet by mouth daily.      nystatin (MYCOSTATIN/NYSTOP) powder APPLY 1 POWDER TOPICALLY THREE TIMES DAILY 45 g 0   ondansetron (ZOFRAN) 8 MG tablet Take 1 tablet (8 mg total) by mouth every 8 (eight) hours as needed for nausea or vomiting. 30 tablet 1   potassium chloride SA (KLOR-CON M) 20 MEQ tablet TAKE 1 TABLET BY MOUTH DAILY 100 tablet 2   simvastatin (ZOCOR) 40 MG tablet Take 1 tablet (40 mg total) by mouth at bedtime. 100 tablet 3   triamcinolone cream (KENALOG) 0.5 % Apply 1 Application topically 2 (two) times daily. 90 g 1   triamterene-hydrochlorothiazide (DYAZIDE) 37.5-25 MG capsule Take 1 each (1 capsule total) by mouth daily. 90 capsule 1   zinc gluconate 50 MG tablet Take 50 mg by mouth daily.     prochlorperazine (COMPAZINE) 10 MG tablet Take 1 tablet (10 mg total) by mouth every 6 (six) hours as needed (NAUSEA). 30 tablet 1   trifluridine-tipiracil (LONSURF) 20-8.19 MG tablet Take 3 tablets (60 mg of trifluridine total) by mouth 2 (two) times daily. 1hr after AM & PM  meals days 1-5, 8-12. Repeat every 28d. (Patient not taking: Reported on 12/13/2023) 60 tablet 1   No current facility-administered medications for this visit.   Facility-Administered Medications Ordered in Other Visits  Medication Dose Route Frequency Provider Last Rate Last Admin   0.9 %  sodium chloride infusion   Intravenous Continuous [name], MD   Stopped at 12/13/23 1030   heparin lock flush 100 UNIT/ML injection              PHYSICAL EXAMINATION: ECOG PERFORMANCE STATUS: 1 - Symptomatic but completely ambulatory  Physical Exam Constitutional:      General: She is not in acute distress. HENT:     Head: Normocephalic and atraumatic.  Eyes:     General: No scleral icterus. Cardiovascular:     Rate and Rhythm: Normal rate.     Heart sounds: Normal heart sounds.  Pulmonary:     Effort: Pulmonary effort is normal. No respiratory distress.     Breath sounds: No wheezing.  Abdominal:     General: Bowel sounds are normal. There is no distension.     Palpations: Abdomen is soft.     Comments: + Colostomy bag   Musculoskeletal:        General: No deformity. Normal range of motion.     Cervical back: Normal range of motion and neck supple.     Right lower leg: Edema present.  Skin:    General: Skin is warm and dry.  Neurological:     Mental Status: She is alert and oriented to person, place, and time. Mental status is at baseline.     Cranial Nerves: No cranial nerve deficit.     Coordination: Coordination normal.       LABORATORY DATA:  I have reviewed the data as listed    Latest Ref Rng & Units 12/13/2023    8:25 AM 12/04/2023    8:14 AM 11/13/2023    8:00 AM  CBC  WBC 4.0 - 10.5 K/uL 5.6  5.5  2.8   Hemoglobin 12.0 - 15.0 g/dL 16.1  09.6  04.5   Hematocrit 36.0 - 46.0 % 34.5  37.3  32.9  Platelets 150 - 400 K/uL 230  186  176       Latest Ref Rng & Units 12/13/2023    8:25 AM 12/04/2023    8:14 AM 11/13/2023    8:00 AM  CMP  Glucose 70 - 99 mg/dL 161  096   045   BUN 8 - 23 mg/dL 16  18  13    Creatinine 0.44 - 1.00 mg/dL 4.09  8.11  9.14   Sodium 135 - 145 mmol/L 138  136  139   Potassium 3.5 - 5.1 mmol/L 3.9  4.0  3.9   Chloride 98 - 111 mmol/L 101  99  105   CO2 22 - 32 mmol/L 28  28  26    Calcium 8.9 - 10.3 mg/dL 9.5  9.4  9.2   Total Protein 6.5 - 8.1 g/dL 7.2  7.2  6.5   Total Bilirubin 0.0 - 1.2 mg/dL 0.5  0.6  0.6   Alkaline Phos 38 - 126 U/L 75  78  72   AST 15 - 41 U/L 50  46  37   ALT 0 - 44 U/L 27  24  15       RADIOGRAPHIC STUDIES: I have personally reviewed the radiological images as listed and agreed with the findings in the report. CT CHEST ABDOMEN PELVIS W CONTRAST Result Date: 11/28/2023 CLINICAL DATA:  Rectal cancer, follow-up.  * Tracking Code: BO * EXAM: CT CHEST, ABDOMEN, AND PELVIS WITH CONTRAST TECHNIQUE: Multidetector CT imaging of the chest, abdomen and pelvis was performed following the standard protocol during bolus administration of intravenous contrast. RADIATION DOSE REDUCTION: This exam was performed according to the departmental dose-optimization program which includes automated exposure control, adjustment of the mA and/or kV according to patient size and/or use of iterative reconstruction technique. CONTRAST:  85mL OMNIPAQUE IOHEXOL 300 MG/ML  SOLN COMPARISON:  Multiple priors including CT September 24, 2023 FINDINGS: CT CHEST FINDINGS Cardiovascular: Right chest Port-A-Cath with tip in the right atrium. Thoracic aortic atherosclerosis. Dilated main pulmonary artery. Coronary artery calcifications. Normal size heart. Mediastinum/Nodes: Prominent supraclavicular lymph nodes measure 9 mm on image 6/2 previously 7 mm. No pathologically enlarged mediastinal, hilar or axillary lymph nodes. No suspicious thyroid nodule.  Tiny hiatal hernia. Lungs/Pleura: Numerous bilateral solid pulmonary nodules are again seen. Some of which have increased in size and others are stable in size. For reference:: -medial right middle lobe  pulmonary nodule measures 18 x 16 mm on image 95/3 previously 16 x 14 mm. -anterior left lower lobe pulmonary nodule measures 7 mm on image 111/3, unchanged. -left upper lobe pulmonary nodule measures 9 mm on image 65/3 previously 7 mm. Musculoskeletal: No aggressive lytic or blastic lesion of bone. Multilevel degenerative change of the spine. CT ABDOMEN PELVIS FINDINGS Hepatobiliary: Increased size of the subcapsular lesion in the anterior inferior right lobe of the liver now measuring 10 mm on image 69/2 previously 8 mm. Gallbladder is surgically absent. No biliary ductal dilation. Pancreas: No pancreatic ductal dilation or evidence of acute inflammation. Spleen: No splenomegaly.  Stable splenic lymphangioma/cysts. Adrenals/Urinary Tract: No suspicious adrenal nodule. Stable bilateral renal cysts. No hydronephrosis. Urinary bladder is unremarkable for degree of distension. Stomach/Bowel: Radiopaque enteric contrast material traverses the transverse colon. Stomach is unremarkable for degree of distension. No pathologic dilation of small or large bowel. Questionable wall thickening of matted loops of small bowel in the left lower quadrant on image 89/2. Prior partial colectomy and rectal resection with left anterior abdominal wall colostomy. Fluid  and soft tissue stranding in the mesorectal/presacral space without discrete enhancing soft tissue nodularity. Vascular/Lymphatic: Aortic atherosclerosis. Increased size of retroperitoneal, iliac side chain and inguinal lymph nodes. For reference: Left periaortic lymph node measures 12 mm in short axis on image 66/2 previously 9 mm. -right external iliac lymph node measuring 18 mm in short axis on image 98/2 previously measured 14 mm -Right pelvic sidewall lymph node measures 10 mm in short axis on image 93/2 previously 7 mm. Reproductive: Uterus is surgically absent. Other: Small volume abdominopelvic free fluid. Postsurgical change in the abdominal wall. Musculoskeletal:  Similar appearance of the permeative lytic metastatic lesion in the left iliac wing. No new aggressive lytic or blastic lesion of bone. Multilevel degenerative change of the spine. Demineralization of bone. IMPRESSION: 1. Increased size of retroperitoneal, iliac side chain and inguinal lymph nodes, concerning for progressive nodal metastatic disease. 2. Numerous bilateral solid pulmonary nodules are again seen, some of which have increased in size and others are stable in size overall compatible with disease progression. 3. Increased size of the subcapsular lesion in the anterior inferior right lobe of the liver, which has been stable over multiple prior examinations and was not FDG avid on prior PET-CT. Suggest attention on follow-up imaging. 4. 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 5. Questionable wall thickening of matted loops of small bowel in the left lower quadrant, suggest attention on follow-up imaging. 6. Small volume abdominopelvic free fluid. 7.  Aortic Atherosclerosis (ICD10-I70.0). Electronically Signed   By: Maudry Mayhew M.D.   On: 11/28/2023 15:20   DG Bone Density Result Date: 11/19/2023 EXAM: DUAL X-RAY ABSORPTIOMETRY (DXA) FOR BONE MINERAL DENSITY IMPRESSION: Your patient Odeth Bry completed a BMD test on 11/19/2023 using the [employer] DXA System (software version: 14.10) manufactured by [employer]. The following summarizes the results of our evaluation. Technologist:VLM PATIENT BIOGRAPHICAL: Name: Lachlan, Pelto Patient ID: [id number] Birth Date: August 18, 1946 Height: 64.0 in. Gender: Female Exam Date: 11/19/2023 Weight: 171.2 lbs. Indications: Chemo, Colon CA, Hysterectomy Fractures: Treatments: Vitamin D DENSITOMETRY RESULTS: Site      Region        Measured Date Measured Age WHO Classification Young Adult T-score BMD         %Change vs. Previous Significant Change (*) AP Spine L1-L4 (L2,L3) 11/19/20[age] Normal 0.2 1.203 g/cm2  - - DualFemur Total Right 11/19/2023 77.6 Normal -0.5 0.946 g/cm2 - - ASSESSMENT: The BMD measured at Femur Total Right is 0.946 g/cm2 with a T-score of -0.5. This patient is considered normal according to World Health Organization [facility]) criteria. L-2 & L-3 were excluded due to degenerative changes. The scan quality is good. World Science writer [facility]) criteria for post-menopausal, Caucasian Women: Normal:                   T-score at or above -1 SD Osteopenia/low bone mass: T-score between -1 and -2.5 SD Osteoporosis:             T-score at or below -2.5 SD RECOMMENDATIONS: 1. All patients should optimize calcium and vitamin D intake. 2. Consider FDA-approved medical therapies in postmenopausal women and men aged 4 years and older, based on the following: a. A hip or vertebral(clinical or morphometric) fracture b. T-score < -2.5 at the femoral neck or spine after appropriate evaluation to exclude secondary causes c. Low bone mass (T-score between -1.0 and -2.5 at the femoral neck or spine) and a 10-year probability of  a hip fracture > 3% or a 10-year probability of a major osteoporosis-related fracture > 20% based on the [place]-adapted WHO algorithm 3. Clinician judgment and/or patient preferences may indicate treatment for people with 10-year fracture probabilities above or below these levels FOLLOW-UP: People with diagnosed cases of osteoporosis or at high risk for fracture should have regular bone mineral density tests. For patients eligible for Medicare, routine testing is allowed once every 2 years. The testing frequency can be increased to one year for patients who have rapidly progressing disease, those who are receiving or discontinuing medical therapy to restore bone mass, or have additional risk factors. I have reviewed this report, and agree with the above findings. [facility] Electronically Signed   By: Frederico Hamman M.D.   On: 11/19/2023 10:00   CT Chest W Contrast Result Date:  09/24/2023 CLINICAL DATA:  Metastatic rectal cancer on palliative chemotherapy. Restaging. * Tracking Code: BO * EXAM: CT CHEST WITH CONTRAST TECHNIQUE: Multidetector CT imaging of the chest was performed during intravenous contrast administration. RADIATION DOSE REDUCTION: This exam was performed according to the departmental dose-optimization program which includes automated exposure control, adjustment of the mA and/or kV according to patient size and/or use of iterative reconstruction technique. CONTRAST:  75mL OMNIPAQUE IOHEXOL 300 MG/ML  SOLN COMPARISON:  06/25/2023 chest CT.  07/09/2023 PET-CT. FINDINGS: Cardiovascular: Normal heart size. No significant pericardial effusion/thickening. Left anterior descending and right coronary atherosclerosis. Right internal jugular Port-A-Cath terminates at the cavoatrial junction. Atherosclerotic nonaneurysmal thoracic aorta. Dilated main pulmonary artery (3.5 cm diameter). No central pulmonary emboli. Mediastinum/Nodes: No significant thyroid nodules. Unremarkable esophagus. No pathologically enlarged axillary, mediastinal or hilar lymph nodes. Lungs/Pleura: No pneumothorax. Trace bilateral posterior pleural effusions. No acute consolidative airspace disease. Numerous (greater than 20) solid pulmonary nodules scattered throughout both lungs involving all lung lobes, all stable in size since 06/25/2023 chest CT with representative 1.6 x 1.4 cm medial right middle lobe nodule (series 4/image 101), 0.7 cm anterior left lower lobe nodule (series 4/image 110) and 0.7 cm left upper lobe nodule (series 4/image 64). No new significant pulmonary nodules. Upper abdomen: Numerous simple upper renal cysts bilaterally, largest 5.9 cm on the right, for which no follow-up imaging is recommended. Musculoskeletal: No aggressive appearing focal osseous lesions. Moderate thoracic spondylosis. IMPRESSION: 1. Bilateral pulmonary metastases are all stable from 06/25/2023 chest CT. 2. No  thoracic adenopathy. No new or progressive metastatic disease in the chest. 3. Trace bilateral posterior pleural effusions. 4. Two-vessel coronary atherosclerosis. 5. Dilated main pulmonary artery, suggesting pulmonary arterial hypertension. 6.  Aortic Atherosclerosis (ICD10-I70.0). Electronically Signed   By: Delbert Phenix M.D.   On: 09/24/2023 19:15   DG ABD ACUTE 2+V W 1V CHEST Result Date: 09/19/2023 CLINICAL DATA:  Small bowel obstruction. EXAM: DG ABDOMEN ACUTE WITH 1 VIEW CHEST COMPARISON:  September 18, 2023. FINDINGS: Right internal jugular Port-A-Cath is noted. Right basilar atelectasis or infiltrate is noted. Probable small pleural effusions are noted. Nasogastric tube tip seen in proximal stomach. No abnormal bowel dilatation is noted. Postsurgical changes are noted in right upper quadrant. IMPRESSION: Right basilar atelectasis or infiltrate with probable small pleural effusions. Nasogastric tube tip seen in proximal stomach. No abnormal bowel dilatation. Electronically Signed   By: Lupita Raider M.D.   On: 09/19/2023 11:20   DG Abd 1 View Result Date: 09/18/2023 CLINICAL DATA:  78 year old female with abdominal distension. Small-bowel obstruction. EXAM: ABDOMEN - 1 VIEW COMPARISON:  09/16/2023 radiograph and CT Abdomen and Pelvis. FINDINGS: Enteric  tube remains in place, side hole at the level of the gastric body. Paucity of bowel gas similar to the CT scout view 2 days ago. Stable abdominal wall surgical clips or sutures. Stable lung bases. Stable visualized osseous structures. Vascular calcifications. IMPRESSION: 1. Stable and satisfactory enteric tube placement. 2. Persistent paucity of bowel gas similar to the CT scout view. Residual small-bowel obstruction difficult to exclude. Electronically Signed   By: Odessa Fleming M.D.   On: 09/18/2023 08:37   DG Abd 1 View Result Date: 09/16/2023 CLINICAL DATA:  X-ray for NG tube placement EXAM: ABDOMEN - 1 VIEW limited for tube placement COMPARISON:  CT  earlier 09/16/2023 FINDINGS: Limited x-ray of the upper abdomen demonstrates enteric tube with tip overlying the midbody of the stomach. Surgical changes in the midabdomen. Overlapping cardiac leads. IMPRESSION: Limited x-ray for tube placement has a enteric tube with tip overlying the midbody of the stomach. Electronically Signed   By: Karen Kays M.D.   On: 09/16/2023 18:09   CT ABDOMEN PELVIS W CONTRAST Result Date: 09/16/2023 CLINICAL DATA:  Abdominal pain and nausea EXAM: CT ABDOMEN AND PELVIS WITH CONTRAST TECHNIQUE: Multidetector CT imaging of the abdomen and pelvis was performed using the standard protocol following bolus administration of intravenous contrast. RADIATION DOSE REDUCTION: This exam was performed according to the departmental dose-optimization program which includes automated exposure control, adjustment of the mA and/or kV according to patient size and/or use of iterative reconstruction technique. CONTRAST:  80mL OMNIPAQUE IOHEXOL 300 MG/ML  SOLN COMPARISON:  06/20/2023 FINDINGS: Lower chest: 1.8 x 1.4 cm right middle lobe nodule (series 4, image 16), not significantly changed in size. Additional scattered subcentimeter bibasilar nodules are also unchanged. Mild bibasilar subsegmental atelectasis. Circumferential thickening of the distal esophagus. Hepatobiliary: Stable 8 mm subcapsular lesion in the anterior inferior right hepatic lobe (series 2, image 37). No new focal liver abnormality. Prior cholecystectomy with similar degree of biliary dilatation. Pancreas: Mild ductal dilatation without focal lesion or inflammatory changes. Spleen: Stable splenic cyst.  No splenomegaly. Adrenals/Urinary Tract: Unremarkable adrenal glands. Multiple bilateral renal cysts which require no follow-up imaging. No renal stone or hydronephrosis. Urinary bladder within normal limits. Stomach/Bowel: Prior colon resection with left anterior abdominal wall colostomy. Adjacent parastomal hernia. Small volume  fluid within the hernia sac. Stomach is distended. Multiple dilated loops of small bowel throughout the abdomen with transition point in the anterior aspect of the lower abdomen, left of midline (series 2, image 54). Normal appendix in the right lower quadrant. Vascular/Lymphatic: Aortoiliac atherosclerosis. Enlarged right external iliac chain lymph node measuring 1.4 cm (series 2, image 68), previously 1.3 cm. Several mildly prominent retroperitoneal lymph nodes have increased in size from prior but remain subcentimeter in size. Reproductive: Status post hysterectomy. No adnexal masses. Other: Small volume ascites.  No pneumoperitoneum. Musculoskeletal: Permeative metastatic lesion the left iliac wing not appreciably changed. No pathologic fracture. No new bony lesion identified. IMPRESSION: 1. Small-bowel obstruction with transition point in the anterior aspect of the lower abdomen, left of midline. This may be secondary to adhesions. 2. Small volume ascites. 3. Prior colon resection with left anterior abdominal wall colostomy. Adjacent parastomal hernia. Small volume fluid within the hernia sac. 4. Stable pulmonary metastatic disease. 5. Stable metastatic lesion of the left iliac wing. 6. Enlarged right external iliac chain lymph node measuring 1.4 cm, previously 1.3 cm. Several mildly prominent retroperitoneal lymph nodes have increased in size from prior but remain subcentimeter in size. 7. Aortic atherosclerosis (ICD10-I70.0). Electronically Signed  By: Duanne Guess D.O.   On: 09/16/2023 14:07      CT CHEST ABDOMEN PELVIS W CONTRAST Result Date: 11/28/2023 CLINICAL DATA:  Rectal cancer, follow-up.  * Tracking Code: BO * EXAM: CT CHEST, ABDOMEN, AND PELVIS WITH CONTRAST TECHNIQUE: Multidetector CT imaging of the chest, abdomen and pelvis was performed following the standard protocol during bolus administration of intravenous contrast. RADIATION DOSE REDUCTION: This exam was performed according to the  departmental dose-optimization program which includes automated exposure control, adjustment of the mA and/or kV according to patient size and/or use of iterative reconstruction technique. CONTRAST:  85mL OMNIPAQUE IOHEXOL 300 MG/ML  SOLN COMPARISON:  Multiple priors including CT September 24, 2023 FINDINGS: CT CHEST FINDINGS Cardiovascular: Right chest Port-A-Cath with tip in the right atrium. Thoracic aortic atherosclerosis. Dilated main pulmonary artery. Coronary artery calcifications. Normal size heart. Mediastinum/Nodes: Prominent supraclavicular lymph nodes measure 9 mm on image 6/2 previously 7 mm. No pathologically enlarged mediastinal, hilar or axillary lymph nodes. No suspicious thyroid nodule.  Tiny hiatal hernia. Lungs/Pleura: Numerous bilateral solid pulmonary nodules are again seen. Some of which have increased in size and others are stable in size. For reference:: -medial right middle lobe pulmonary nodule measures 18 x 16 mm on image 95/3 previously 16 x 14 mm. -anterior left lower lobe pulmonary nodule measures 7 mm on image 111/3, unchanged. -left upper lobe pulmonary nodule measures 9 mm on image 65/3 previously 7 mm. Musculoskeletal: No aggressive lytic or blastic lesion of bone. Multilevel degenerative change of the spine. CT ABDOMEN PELVIS FINDINGS Hepatobiliary: Increased size of the subcapsular lesion in the anterior inferior right lobe of the liver now measuring 10 mm on image 69/2 previously 8 mm. Gallbladder is surgically absent. No biliary ductal dilation. Pancreas: No pancreatic ductal dilation or evidence of acute inflammation. Spleen: No splenomegaly.  Stable splenic lymphangioma/cysts. Adrenals/Urinary Tract: No suspicious adrenal nodule. Stable bilateral renal cysts. No hydronephrosis. Urinary bladder is unremarkable for degree of distension. Stomach/Bowel: Radiopaque enteric contrast material traverses the transverse colon. Stomach is unremarkable for degree of distension. No  pathologic dilation of small or large bowel. Questionable wall thickening of matted loops of small bowel in the left lower quadrant on image 89/2. Prior partial colectomy and rectal resection with left anterior abdominal wall colostomy. Fluid and soft tissue stranding in the mesorectal/presacral space without discrete enhancing soft tissue nodularity. Vascular/Lymphatic: Aortic atherosclerosis. Increased size of retroperitoneal, iliac side chain and inguinal lymph nodes. For reference: Left periaortic lymph node measures 12 mm in short axis on image 66/2 previously 9 mm. -right external iliac lymph node measuring 18 mm in short axis on image 98/2 previously measured 14 mm -Right pelvic sidewall lymph node measures 10 mm in short axis on image 93/2 previously 7 mm. Reproductive: Uterus is surgically absent. Other: Small volume abdominopelvic free fluid. Postsurgical change in the abdominal wall. Musculoskeletal: Similar appearance of the permeative lytic metastatic lesion in the left iliac wing. No new aggressive lytic or blastic lesion of bone. Multilevel degenerative change of the spine. Demineralization of bone. IMPRESSION: 1. Increased size of retroperitoneal, iliac side chain and inguinal lymph nodes, concerning for progressive nodal metastatic disease. 2. Numerous bilateral solid pulmonary nodules are again seen, some of which have increased in size and others are stable in size overall compatible with disease progression. 3. Increased size of the subcapsular lesion in the anterior inferior right lobe of the liver, which has been stable over multiple prior examinations and was not FDG avid on prior PET-CT.  Suggest attention on follow-up imaging. 4. 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 5. Questionable wall thickening of matted loops of small bowel in the left lower quadrant, suggest attention on follow-up imaging. 6. Small volume abdominopelvic free fluid. 7.   Aortic Atherosclerosis (ICD10-I70.0). Electronically Signed   By: Maudry Mayhew M.D.   On: 11/28/2023 15:20   DG Bone Density Result Date: 11/19/2023 EXAM: DUAL X-RAY ABSORPTIOMETRY (DXA) FOR BONE MINERAL DENSITY IMPRESSION: Your patient Jacqueline Yoder completed a BMD test on 11/19/2023 using the [employer] DXA System (software version: 14.10) manufactured by [employer]. The following summarizes the results of our evaluation. Technologist:VLM PATIENT BIOGRAPHICAL: Name: Shainna, Faux Patient ID: [id number] Birth Date: 1946-09-02 Height: 64.0 in. Gender: Female Exam Date: 11/19/2023 Weight: 171.2 lbs. Indications: Chemo, Colon CA, Hysterectomy Fractures: Treatments: Vitamin D DENSITOMETRY RESULTS: Site      Region        Measured Date Measured Age WHO Classification Young Adult T-score BMD         %Change vs. Previous Significant Change (*) AP Spine L1-L4 (L2,L3) 11[age]19/2023 77.6 Normal 0.2 1.203 g/cm2 - - DualFemur Total Right 11/19/2023 76.6 Normal -0.5 0.946 g/cm2 - - ASSESSMENT: The BMD measured at Femur Total Right is 0.946 g/cm2 with a T-score of -0.5. This patient is considered normal according to World Health Organization [facility]) criteria. L-2 & L-3 were excluded due to degenerative changes. The scan quality is good. World Science writer [facility]) criteria for post-menopausal, Caucasian Women: Normal:                   T-score at or above -1 SD Osteopenia/low bone mass: T-score between -1 and -2.5 SD Osteoporosis:             T-score at or below -2.5 SD RECOMMENDATIONS: 1. All patients should optimize calcium and vitamin D intake. 2. Consider FDA-approved medical therapies in postmenopausal women and men aged 29 years and older, based on the following: a. A hip or vertebral(clinical or morphometric) fracture b. T-score < -2.5 at the femoral neck or spine after appropriate evaluation to exclude secondary causes c. Low bone mass (T-score between -1.0 and -2.5 at the femoral neck or spine) and a  10-year probability of a hip fracture > 3% or a 10-year probability of a major osteoporosis-related fracture > 20% based on the [place]-adapted WHO algorithm 3. Clinician judgment and/or patient preferences may indicate treatment for people with 10-year fracture probabilities above or below these levels FOLLOW-UP: People with diagnosed cases of osteoporosis or at high risk for fracture should have regular bone mineral density tests. For patients eligible for Medicare, routine testing is allowed once every 2 years. The testing frequency can be increased to one year for patients who have rapidly progressing disease, those who are receiving or discontinuing medical therapy to restore bone mass, or have additional risk factors. I have reviewed this report, and agree with the above findings. [facility] Electronically Signed   By: Frederico Hamman M.D.   On: 11/19/2023 10:00   CT Chest W Contrast Result Date: 09/24/2023 CLINICAL DATA:  Metastatic rectal cancer on palliative chemotherapy. Restaging. * Tracking Code: BO * EXAM: CT CHEST WITH CONTRAST TECHNIQUE: Multidetector CT imaging of the chest was performed during intravenous contrast administration. RADIATION DOSE REDUCTION: This exam was performed according to the departmental dose-optimization program which includes automated exposure control, adjustment of the mA and/or kV according to patient size and/or use  of iterative reconstruction technique. CONTRAST:  75mL OMNIPAQUE IOHEXOL 300 MG/ML  SOLN COMPARISON:  06/25/2023 chest CT.  07/09/2023 PET-CT. FINDINGS: Cardiovascular: Normal heart size. No significant pericardial effusion/thickening. Left anterior descending and right coronary atherosclerosis. Right internal jugular Port-A-Cath terminates at the cavoatrial junction. Atherosclerotic nonaneurysmal thoracic aorta. Dilated main pulmonary artery (3.5 cm diameter). No central pulmonary emboli. Mediastinum/Nodes: No significant thyroid nodules.  Unremarkable esophagus. No pathologically enlarged axillary, mediastinal or hilar lymph nodes. Lungs/Pleura: No pneumothorax. Trace bilateral posterior pleural effusions. No acute consolidative airspace disease. Numerous (greater than 20) solid pulmonary nodules scattered throughout both lungs involving all lung lobes, all stable in size since 06/25/2023 chest CT with representative 1.6 x 1.4 cm medial right middle lobe nodule (series 4/image 101), 0.7 cm anterior left lower lobe nodule (series 4/image 110) and 0.7 cm left upper lobe nodule (series 4/image 64). No new significant pulmonary nodules. Upper abdomen: Numerous simple upper renal cysts bilaterally, largest 5.9 cm on the right, for which no follow-up imaging is recommended. Musculoskeletal: No aggressive appearing focal osseous lesions. Moderate thoracic spondylosis. IMPRESSION: 1. Bilateral pulmonary metastases are all stable from 06/25/2023 chest CT. 2. No thoracic adenopathy. No new or progressive metastatic disease in the chest. 3. Trace bilateral posterior pleural effusions. 4. Two-vessel coronary atherosclerosis. 5. Dilated main pulmonary artery, suggesting pulmonary arterial hypertension. 6.  Aortic Atherosclerosis (ICD10-I70.0). Electronically Signed   By: Delbert Phenix M.D.   On: 09/24/2023 19:15   DG ABD ACUTE 2+V W 1V CHEST Result Date: 09/19/2023 CLINICAL DATA:  Small bowel obstruction. EXAM: DG ABDOMEN ACUTE WITH 1 VIEW CHEST COMPARISON:  September 18, 2023. FINDINGS: Right internal jugular Port-A-Cath is noted. Right basilar atelectasis or infiltrate is noted. Probable small pleural effusions are noted. Nasogastric tube tip seen in proximal stomach. No abnormal bowel dilatation is noted. Postsurgical changes are noted in right upper quadrant. IMPRESSION: Right basilar atelectasis or infiltrate with probable small pleural effusions. Nasogastric tube tip seen in proximal stomach. No abnormal bowel dilatation. Electronically Signed   By: Lupita Raider M.D.   On: 09/19/2023 11:20   DG Abd 1 View Result Date: 09/18/2023 CLINICAL DATA:  78 year old female with abdominal distension. Small-bowel obstruction. EXAM: ABDOMEN - 1 VIEW COMPARISON:  09/16/2023 radiograph and CT Abdomen and Pelvis. FINDINGS: Enteric tube remains in place, side hole at the level of the gastric body. Paucity of bowel gas similar to the CT scout view 2 days ago. Stable abdominal wall surgical clips or sutures. Stable lung bases. Stable visualized osseous structures. Vascular calcifications. IMPRESSION: 1. Stable and satisfactory enteric tube placement. 2. Persistent paucity of bowel gas similar to the CT scout view. Residual small-bowel obstruction difficult to exclude. Electronically Signed   By: Odessa Fleming M.D.   On: 09/18/2023 08:37   DG Abd 1 View Result Date: 09/16/2023 CLINICAL DATA:  X-ray for NG tube placement EXAM: ABDOMEN - 1 VIEW limited for tube placement COMPARISON:  CT earlier 09/16/2023 FINDINGS: Limited x-ray of the upper abdomen demonstrates enteric tube with tip overlying the midbody of the stomach. Surgical changes in the midabdomen. Overlapping cardiac leads. IMPRESSION: Limited x-ray for tube placement has a enteric tube with tip overlying the midbody of the stomach. Electronically Signed   By: Karen Kays M.D.   On: 09/16/2023 18:09   CT ABDOMEN PELVIS W CONTRAST Result Date: 09/16/2023 CLINICAL DATA:  Abdominal pain and nausea EXAM: CT ABDOMEN AND PELVIS WITH CONTRAST TECHNIQUE: Multidetector CT imaging of the abdomen and pelvis was performed using the  standard protocol following bolus administration of intravenous contrast. RADIATION DOSE REDUCTION: This exam was performed according to the departmental dose-optimization program which includes automated exposure control, adjustment of the mA and/or kV according to patient size and/or use of iterative reconstruction technique. CONTRAST:  80mL OMNIPAQUE IOHEXOL 300 MG/ML  SOLN COMPARISON:  06/20/2023  FINDINGS: Lower chest: 1.8 x 1.4 cm right middle lobe nodule (series 4, image 16), not significantly changed in size. Additional scattered subcentimeter bibasilar nodules are also unchanged. Mild bibasilar subsegmental atelectasis. Circumferential thickening of the distal esophagus. Hepatobiliary: Stable 8 mm subcapsular lesion in the anterior inferior right hepatic lobe (series 2, image 37). No new focal liver abnormality. Prior cholecystectomy with similar degree of biliary dilatation. Pancreas: Mild ductal dilatation without focal lesion or inflammatory changes. Spleen: Stable splenic cyst.  No splenomegaly. Adrenals/Urinary Tract: Unremarkable adrenal glands. Multiple bilateral renal cysts which require no follow-up imaging. No renal stone or hydronephrosis. Urinary bladder within normal limits. Stomach/Bowel: Prior colon resection with left anterior abdominal wall colostomy. Adjacent parastomal hernia. Small volume fluid within the hernia sac. Stomach is distended. Multiple dilated loops of small bowel throughout the abdomen with transition point in the anterior aspect of the lower abdomen, left of midline (series 2, image 54). Normal appendix in the right lower quadrant. Vascular/Lymphatic: Aortoiliac atherosclerosis. Enlarged right external iliac chain lymph node measuring 1.4 cm (series 2, image 68), previously 1.3 cm. Several mildly prominent retroperitoneal lymph nodes have increased in size from prior but remain subcentimeter in size. Reproductive: Status post hysterectomy. No adnexal masses. Other: Small volume ascites.  No pneumoperitoneum. Musculoskeletal: Permeative metastatic lesion the left iliac wing not appreciably changed. No pathologic fracture. No new bony lesion identified. IMPRESSION: 1. Small-bowel obstruction with transition point in the anterior aspect of the lower abdomen, left of midline. This may be secondary to adhesions. 2. Small volume ascites. 3. Prior colon resection with left  anterior abdominal wall colostomy. Adjacent parastomal hernia. Small volume fluid within the hernia sac. 4. Stable pulmonary metastatic disease. 5. Stable metastatic lesion of the left iliac wing. 6. Enlarged right external iliac chain lymph node measuring 1.4 cm, previously 1.3 cm. Several mildly prominent retroperitoneal lymph nodes have increased in size from prior but remain subcentimeter in size. 7. Aortic atherosclerosis (ICD10-I70.0). Electronically Signed   By: Duanne Guess D.O.   On: 09/16/2023 14:07

## 2023-12-13 NOTE — Assessment & Plan Note (Signed)
 Stable symptoms, Grade 1-2.patient copes well.

## 2023-12-14 ENCOUNTER — Other Ambulatory Visit: Payer: Self-pay

## 2023-12-14 ENCOUNTER — Encounter: Payer: Self-pay | Admitting: Emergency Medicine

## 2023-12-14 ENCOUNTER — Ambulatory Visit
Admission: EM | Admit: 2023-12-14 | Discharge: 2023-12-14 | Disposition: A | Discharge status: Home / Self Care | Payer: 59 | Attending: Emergency Medicine | Admitting: Emergency Medicine

## 2023-12-14 DIAGNOSIS — M542 Cervicalgia: Secondary | ICD-10-CM | POA: Diagnosis not present

## 2023-12-14 MED ORDER — PREDNISONE 10 MG (21) PO TBPK: ORAL_TABLET | Freq: Every day | ORAL | 0 refills | Status: DC

## 2023-12-14 NOTE — ED Triage Notes (Signed)
 Patient presents to [facility] for evaluation of right neck pain, worse with palpation.  Patient had bursitis in right shoulder, stopped steroids 2 weeks ago for that.  Patient states it has been radiating up to her neck.  Not necessarily worse with movement.
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
## 2023-12-14 NOTE — Discharge Instructions (Addendum)
 Your pain is most likely caused by irritation to the muscles.  Nerve compression causes pain to radiate down the arm  Starting tomorrow take prednisone every morning with food as directed, may continue Tylenol or any topical medicines  You may use heating pad in 15 minute intervals as needed for additional comfort, or you may find comfort in using ice in 10-15 minutes over affected area  Begin massaging and stretching affected area daily for 10 minutes as tolerated to further loosen muscles   When sitting and  lying down place pillow underneath arm and behind back  If pain persist after recommended treatment or reoccurs if may be beneficial to follow up with orthopedic specialist for evaluation, this doctor specializes in the bones and can manage your symptoms long-term with options such as but not limited to imaging, medications or physical therapy

## 2023-12-15 ENCOUNTER — Telehealth: Payer: 59

## 2023-12-19 ENCOUNTER — Other Ambulatory Visit (INDEPENDENT_AMBULATORY_CARE_PROVIDER_SITE_OTHER): Payer: Self-pay | Admitting: Nurse Practitioner

## 2023-12-19 DIAGNOSIS — I87009 Postthrombotic syndrome without complications of unspecified extremity: Secondary | ICD-10-CM

## 2023-12-20 ENCOUNTER — Ambulatory Visit (INDEPENDENT_AMBULATORY_CARE_PROVIDER_SITE_OTHER): Payer: 59

## 2023-12-20 ENCOUNTER — Ambulatory Visit (INDEPENDENT_AMBULATORY_CARE_PROVIDER_SITE_OTHER): Payer: 59 | Admitting: Nurse Practitioner

## 2023-12-20 DIAGNOSIS — I87001 Postthrombotic syndrome without complications of right lower extremity: Secondary | ICD-10-CM | POA: Diagnosis not present

## 2023-12-20 DIAGNOSIS — I87009 Postthrombotic syndrome without complications of unspecified extremity: Secondary | ICD-10-CM

## 2023-12-21 ENCOUNTER — Telehealth: Payer: Self-pay | Admitting: Pharmacist

## 2023-12-21 ENCOUNTER — Ambulatory Visit: Admitting: Internal Medicine

## 2023-12-21 ENCOUNTER — Ambulatory Visit
Admission: RE | Admit: 2023-12-21 | Discharge: 2023-12-21 | Disposition: A | Discharge status: Home / Self Care | Source: Ambulatory Visit | Attending: Internal Medicine | Admitting: Internal Medicine

## 2023-12-21 ENCOUNTER — Encounter: Payer: Self-pay | Admitting: Internal Medicine

## 2023-12-21 VITALS — BP 138/76 | Ht 64.0 in | Wt 160.2 lb

## 2023-12-21 DIAGNOSIS — M25511 Pain in right shoulder: Secondary | ICD-10-CM | POA: Diagnosis not present

## 2023-12-21 DIAGNOSIS — G8929 Other chronic pain: Secondary | ICD-10-CM

## 2023-12-21 DIAGNOSIS — M542 Cervicalgia: Secondary | ICD-10-CM

## 2023-12-21 DIAGNOSIS — M19011 Primary osteoarthritis, right shoulder: Secondary | ICD-10-CM | POA: Diagnosis not present

## 2023-12-21 MED ORDER — TRAMADOL HCL 50 MG PO TABS: 50.0000 mg | ORAL_TABLET | Freq: Three times a day (TID) | ORAL | 0 refills | Status: AC | PRN

## 2023-12-21 MED ORDER — TIZANIDINE HCL 4 MG PO TABS: 2.0000 mg | ORAL_TABLET | Freq: Three times a day (TID) | ORAL | 0 refills | Status: DC | PRN

## 2023-12-21 NOTE — Telephone Encounter (Signed)
 Patient's daughter reached out to ask if her mom was okay to take newly prescribed Tramadol and Zanaflex with her Lonsurf.   There is no drug interaction between Lonsurf and Tramadol or Zanaflex . Pt's daughter informed.

## 2023-12-21 NOTE — Progress Notes (Signed)
 Subjective:    Patient ID: Jacqueline Yoder, female    DOB: 03-Mar-1946, 78 y.o.   MRN: [id number]  HPI  Patient presents to clinic today for follow-up of right shoulder pain.  She was initially seen 2/11 for the same.  Exam was concerning for bursitis and tendinitis with possible rotator cuff involvement.  She was advised to take Tylenol arthritis 3 times daily and was given a 9-day prednisone taper.  She was also advised to avoid repetitive motions, use topical analgesics such as Tiger balm, Biofreeze etc.  Stretching exercises were given.  She subsequently presented to the ER 2/27 with persistent symptoms.  No imaging was obtained at that time.  She was given another prednisone taper and advised to follow-up with her PCP.  Since that time, she reports worsening right shoulder pain that extends into the right side of the neck.  She does not feel like the prednisone has been helpful at all.  She is on Eliquis so she cannot take anti-inflammatories.  She denies any injury or fall.  Review of Systems   Past Medical History:  Diagnosis Date   Allergy    Arthritis    Blood clot in vein    Family history of colon cancer    GERD (gastroesophageal reflux disease)    Hypercholesteremia    Hypertension    Hypertension    Lower extremity edema    Personal history of chemotherapy    Rectal cancer (HCC) 12/2018   Urinary incontinence     Current Outpatient Medications  Medication Sig Dispense Refill   acetaminophen (TYLENOL) 650 MG CR tablet Take 650 mg by mouth every 8 (eight) hours as needed for pain.     Cholecalciferol (VITAMIN D3) 2000 units capsule Take 2,000 Units by mouth daily.     clindamycin (CLINDAGEL) 1 % gel Apply topically 2 (two) times daily. 30 g 5   diclofenac sodium (VOLTAREN) 1 % GEL Apply 2 g topically 4 (four) times daily as needed (joint pain).  11   diphenoxylate-atropine (LOMOTIL) 2.5-0.025 MG tablet Take 1 tablet by mouth 4 (four) times daily as needed for diarrhea or  loose stools. 120 tablet 0   ELIQUIS 2.5 MG TABS tablet TAKE 1 TABLET BY MOUTH TWICE  DAILY 200 tablet 2   fluticasone (FLONASE) 50 MCG/ACT nasal spray Place 1 spray into both nostrils daily as needed for allergies or rhinitis.     gentamicin cream (GARAMYCIN) 0.1 % Apply to affected toe once daily. 30 g 1   ketoconazole (NIZORAL) 2 % cream Apply 1 Application topically daily. For up to 2 weeks. May repeat if need. 30 g 2   lidocaine-prilocaine (EMLA) cream Apply 1 Application topically daily as needed. Apply small amount to port and cover with saran wrap 1-2 hours prior to port access 30 g 3   loratadine (CLARITIN) 10 MG tablet Take 10 mg by mouth daily.     magnesium chloride (SLOW-MAG) 64 MG TBEC SR tablet Take 1 tablet (64 mg total) by mouth 2 (two) times daily. 60 tablet 2   Multiple Vitamins-Minerals (ONE-A-DAY WOMENS 50 PLUS PO) Take 1 tablet by mouth daily.      nystatin (MYCOSTATIN/NYSTOP) powder APPLY 1 POWDER TOPICALLY THREE TIMES DAILY 45 g 0   ondansetron (ZOFRAN) 8 MG tablet Take 1 tablet (8 mg total) by mouth every 8 (eight) hours as needed for nausea or vomiting. 30 tablet 1   potassium chloride SA (KLOR-CON M) 20 MEQ tablet TAKE 1 TABLET  BY MOUTH DAILY 100 tablet 2   predniSONE (STERAPRED UNI-PAK 21 TAB) 10 MG (21) TBPK tablet Take by mouth daily. Take 6 tabs by mouth daily  for 1 days, then 5 tabs for 1 days, then 4 tabs for 1 days, then 3 tabs for 1 days, 2 tabs for 1 days, then 1 tab by mouth daily for 1 days 21 tablet 0   prochlorperazine (COMPAZINE) 10 MG tablet Take 1 tablet (10 mg total) by mouth every 6 (six) hours as needed (NAUSEA). 30 tablet 1   simvastatin (ZOCOR) 40 MG tablet Take 1 tablet (40 mg total) by mouth at bedtime. 100 tablet 3   triamcinolone cream (KENALOG) 0.5 % Apply 1 Application topically 2 (two) times daily. 90 g 1   triamterene-hydrochlorothiazide (DYAZIDE) 37.5-25 MG capsule Take 1 each (1 capsule total) by mouth daily. 90 capsule 1    trifluridine-tipiracil (LONSURF) 20-8.19 MG tablet Take 3 tablets (60 mg of trifluridine total) by mouth 2 (two) times daily. 1hr after AM & PM meals days 1-5, 8-12. Repeat every 28d. (Patient not taking: Reported on 12/13/2023) 60 tablet 1   zinc gluconate 50 MG tablet Take 50 mg by mouth daily.     No current facility-administered medications for this visit.   Facility-Administered Medications Ordered in Other Visits  Medication Dose Route Frequency Provider Last Rate Last Admin   heparin lock flush 100 UNIT/ML injection             Allergies  Allergen Reactions   Sulfamethoxazole-Trimethoprim Other (See Comments)    Also said she had chills and pressure in nose.    Family History  Problem Relation Age of Onset   Colon cancer Brother 59       exposure to chemicals [place]   Hypertension Mother    Stroke Mother    Kidney failure Father    Breast cancer Neg Hx    Ovarian cancer Neg Hx     Social History   Socioeconomic History   Marital status: Widowed    Spouse name: Not on file   Number of children: Not on file   Years of education: Not on file   Highest education level: Some college, no degree  Occupational History   Not on file  Tobacco Use   Smoking status: Former    Current packs/day: 0.00    Types: Cigarettes    Quit date: 12/02/1977    Years since quitting: 46.0   Smokeless tobacco: Former  Building services engineer status: Never Used  Substance and Sexual Activity   Alcohol use: Never   Drug use: Never   Sexual activity: Not Currently    Birth control/protection: None  Other Topics Concern   Not on file  Social History Narrative   Lives with daughter   Social Drivers of Health   Financial Resource Strain: Low Risk  (11/15/2023)   Overall Financial Resource Strain (CARDIA)    Difficulty of Paying Living Expenses: Not hard at all  Food Insecurity: No Food Insecurity (11/15/2023)   Hunger Vital Sign    Worried About Running Out of Food in the Last Year:  Never true    Ran Out of Food in the Last Year: Never true  Transportation Needs: No Transportation Needs (11/15/2023)   PRAPARE - Administrator, Civil Service (Medical): No    Lack of Transportation (Non-Medical): No  Physical Activity: Insufficiently Active (11/15/2023)   Exercise Vital Sign    Days of Exercise per  Week: 3 days    Minutes of Exercise per Session: 10 min  Stress: No Stress Concern Present (11/15/2023)   [employer] of Occupational Health - Occupational Stress Questionnaire    Feeling of Stress : Not at all  Social Connections: Moderately Integrated (11/15/2023)   Social Connection and Isolation Panel [NHANES]    Frequency of Communication with Friends and Family: More than three times a week    Frequency of Social Gatherings with Friends and Family: More than three times a week    Attends Religious Services: More than 4 times per year    Active Member of [employer] or Organizations: Yes    Attends Banker Meetings: More than 4 times per year    Marital Status: Widowed  Intimate Partner Violence: Not At Risk (09/27/2023)   Humiliation, Afraid, Rape, and Kick questionnaire    Fear of Current or Ex-Partner: No    Emotionally Abused: No    Physically Abused: No    Sexually Abused: No     Constitutional: Denies fever, malaise, fatigue, headache or abrupt weight changes.  Respiratory: Denies difficulty breathing, shortness of breath, cough or sputum production.   Cardiovascular: Denies chest pain, chest tightness, palpitations or swelling in the hands or feet.  Musculoskeletal: Patient reports right-sided neck pain, right shoulder pain, decrease in range of motion.  Denies difficulty with gait, muscle pain or joint swelling.  Skin: Denies redness, rashes, lesions or ulcercations.  Neurological: Denies numbness, tingling, weakness or problems with balance and coordination.    No other specific complaints in a complete review of systems (except as  listed in HPI above).      Objective:   Physical Exam BP 138/76   Ht 5\' 4"  (1.626 m)   Wt 160 lb 3.2 oz (72.7 kg)   BMI 27.50 kg/m   Wt Readings from Last 3 Encounters:  12/13/23 160 lb (72.6 kg)  12/04/23 160 lb (72.6 kg)  12/04/23 161 lb 1.6 oz (73.1 kg)    General: Appears their stated age, appears in pain but in NAD. Skin: Warm, dry and intact. No rashes noted. Cardiovascular: Normal rate and rhythm.  Radial pulse 2+ on the right. Pulmonary/Chest: Normal effort and positive vesicular breath sounds. No respiratory distress. No wheezes, rales or ronchi noted.  Musculoskeletal: Normal flexion and lateral bending of the cervical spine.  D6 extension and rotation of the cervical spine.  No bony tenderness noted of the cervical spine.  No pain with palpation of the right paracervical muscle.  Shoulder shrug equal.  Decreased internal and external rotation right shoulder.  She is unable to lift her arm up to attempt a drop can test.  Pain with palpation of the right anterior proximal biceps tendon and right subacromial bursa.  Strength 4/5 RUE, 5/5 LUE.  Handgrips equal.  No difficulty with gait.  Neurological: Alert and oriented.  Coordination normal.    BMET    Component Value Date/Time   NA 138 12/13/2023 0825   K 3.9 12/13/2023 0825   CL 101 12/13/2023 0825   CO2 28 12/13/2023 0825   GLUCOSE 113 (H) 12/13/2023 0825   BUN 16 12/13/2023 0825   CREATININE 0.77 12/13/2023 0825   CREATININE 1.10 (H) 06/24/2021 0802   CALCIUM 9.5 12/13/2023 0825   GFRNONAA >60 12/13/2023 0825   GFRAA >60 07/16/2020 0802    Lipid Panel     Component Value Date/Time   CHOL 166 07/04/2023 0845   TRIG 55 07/04/2023 0845   HDL  70 07/04/2023 0845   CHOLHDL 2.4 07/04/2023 0845   LDLCALC 82 07/04/2023 0845    CBC    Component Value Date/Time   WBC 5.6 12/13/2023 0825   WBC 3.3 (L) 09/20/2023 0426   RBC 3.62 (L) 12/13/2023 0825   HGB 10.9 (L) 12/13/2023 0825   HCT 34.5 (L) 12/13/2023 0825    PLT 230 12/13/2023 0825   MCV 95.3 12/13/2023 0825   MCH 30.1 12/13/2023 0825   MCHC 31.6 12/13/2023 0825   RDW 17.9 (H) 12/13/2023 0825   LYMPHSABS 1.1 12/13/2023 0825   MONOABS 0.7 12/13/2023 0825   EOSABS 0.1 12/13/2023 0825   BASOSABS 0.0 12/13/2023 0825    Hgb A1C Lab Results  Component Value Date   HGBA1C 5.1 07/04/2023            Assessment & Plan:   ER follow-up for right shoulder/right neck pain:  ER notes reviewed She has been on 2 rounds of prednisone without significant improvement Unable to take NSAIDs due to being on Eliquis Rx for Zanaflex 2 mg every 8 hours as needed-Tatian caution given Rx for tramadol 50 mg every 8 hours as needed for severe pain Recommend topical analgesia such as Biofreeze, Tiger balm or IcyHot Will obtain x-ray of the right shoulder today for further evaluation Advised her if symptoms worsen over the weekend that she should go to [facility] walk-in urgent care, otherwise follow-up with PCP to discuss possible cortisone injection  Follow-up with your PCP as previously scheduled Nicki Reaper, NP

## 2023-12-21 NOTE — Patient Instructions (Signed)
 Shoulder Range of Motion Exercises Shoulder range of motion (ROM) exercises are done to keep the shoulder moving freely or to increase movement. They are recommended for people who have shoulder pain or stiffness or who are recovering from a shoulder surgery. Ask your health care provider which exercises are safe for you. Do exercises exactly as told by your health care provider and adjust them as directed. It is normal to feel mild stretching, pulling, tightness, or discomfort as you do these exercises. Stop right away if you feel sudden pain or your pain gets worse. Do not begin these exercises until told by your health care provider. Phase 1 exercise When you are able, do this exercise 1-2 times a day for 30-60 seconds in each direction, or as directed by your health care provider. Pendulum exercise     To do this exercise while sitting: Sit in a chair or at the edge of your bed with your feet flat on the floor. Let your affected arm hang down in front of you over the edge of the bed or chair. Relax your shoulder, arm, and hand. Rock your body so your arm gently swings in small circles. You can also use your unaffected arm to start the motion. Repeat, changing the direction of the circles, swinging your arm left and right, and swinging your arm forward and back. To do this exercise while standing: Stand next to a sturdy chair or table, and hold on to it with your hand on your unaffected side. Bend forward at the waist. Bend your knees slightly. Relax your shoulder, arm, and hand. While keeping your shoulder relaxed, use body motion to swing your arm in small circles. Repeat, changing the direction of the circles, swinging your arm left and right, and swinging your arm forward and back. Between exercises, stand up tall and take a short break to relax your lower back.  Phase 2 exercises Do these exercises 1-2 times a day or as told by your health care provider. Hold each stretch for 30  seconds, and repeat 3 times. Do the exercises with one or both arms as instructed by your health care provider. For these exercises, sit at a table with your hand and arm supported by the table. A chair that slides easily or has wheels can be helpful. External rotation  Turn your chair so that your affected side is nearest to the table. Place your forearm on the table to your side. Bend your arm to about a 90-degree angle (right angle) at the elbow, and place your hand palm-down on the table. Your elbow should be about 6 inches (15 cm) away from your side. Keeping your arm on the table, lean your body forward. Abduction  Turn your chair so that your affected side is nearest to the table. Place your forearm and hand on the table so that your thumb points toward the ceiling and your arm is straight out to your side. Slide your hand out to the side and away from you. To increase the stretch, you can slide your chair away from the table. Flexion: forward stretch  Sit facing the table. Place your hand and elbow on the table in front of you. Slide your hand forward and away from you, using your unaffected arm to do the work. To increase the stretch, you can slide your chair backward. Phase 3 exercises Do these exercises 1-2 times a day or as told by your health care provider. Hold each stretch for 30 seconds, and  repeat 3 times. Do the exercises with one or both arms as instructed by your health care provider. You will need a cane, a piece of PVC pipe, or a sturdy wooden dowel for the wand exercises. Cross-body stretch: posterior capsule stretch  Lift your arm straight out in front of you. Bend your arm in a 90-degree angle (right angle) at the elbow so your forearm moves across your body. Use your other arm to gently pull the elbow across your body, toward your other shoulder. Wall climbs  Stand with your affected arm extended out to the side with your hand resting on a door frame. Slide your  hand slowly up the door frame. To increase the stretch, step through the door frame. Keep your body upright and do not lean. Flexion     To do this exercise while standing: Hold the wand with both of your hands, palms-down. Lift the wand up and over your head, if able. Lift mostly with your affected arm, and use the other arm to help. Push upward with your other arm to gently increase the stretch. To do this exercise while lying down: Lie on your back with your elbows resting on the floor and the wand in both your hands. Your hands will be palm-down, or pointing toward your feet. Lift your hands toward the ceiling, using your unaffected arm to help if needed. Bring your arms overhead as able, using your unaffected arm to help if needed.  Internal rotation  Stand while holding the wand behind you with both hands. Your unaffected arm should be extended above your head with the arm of the affected side extended behind you at the level of your waist. The wand should be pointing straight up and down as you hold it. Slowly pull the wand up behind your back by straightening the elbow of your unaffected arm and bending the elbow of your affected arm. External rotation  Lie on your back with your affected upper arm supported on a small pillow or rolled towel. When you first do this exercise, keep your upper arm close to your body. Over time, bring your arm up to a 90-degree angle (right angle) out to the side. Hold the wand across your stomach and with both hands palm-up. Your elbow on your affected side should be bent at a 90-degree angle. Use your unaffected side to help push your forearm away from you and toward the floor. Keep your elbow on your affected side bent at a 90-degree angle. This information is not intended to replace advice given to you by your health care provider. Make sure you discuss any questions you have with your health care provider. Document Revised: 11/22/2021 Document  Reviewed: 11/22/2021 Elsevier Patient Education  2024 [employer].

## 2023-12-27 ENCOUNTER — Other Ambulatory Visit: Payer: Self-pay

## 2023-12-27 ENCOUNTER — Encounter: Payer: Self-pay | Admitting: Oncology

## 2023-12-27 ENCOUNTER — Encounter (INDEPENDENT_AMBULATORY_CARE_PROVIDER_SITE_OTHER): Payer: Self-pay

## 2023-12-27 ENCOUNTER — Encounter: Payer: Self-pay | Admitting: Internal Medicine

## 2023-12-27 ENCOUNTER — Ambulatory Visit (INDEPENDENT_AMBULATORY_CARE_PROVIDER_SITE_OTHER): Payer: 59 | Admitting: Nurse Practitioner

## 2023-12-27 ENCOUNTER — Inpatient Hospital Stay: Payer: 59

## 2023-12-27 ENCOUNTER — Inpatient Hospital Stay (HOSPITAL_BASED_OUTPATIENT_CLINIC_OR_DEPARTMENT_OTHER): Payer: 59 | Admitting: Oncology

## 2023-12-27 ENCOUNTER — Inpatient Hospital Stay: Payer: 59 | Attending: Oncology

## 2023-12-27 VITALS — BP 150/71 | HR 98 | Temp 98.0°F | Resp 18 | Wt 160.5 lb

## 2023-12-27 VITALS — BP 158/76 | HR 88 | Resp 16

## 2023-12-27 DIAGNOSIS — M47812 Spondylosis without myelopathy or radiculopathy, cervical region: Secondary | ICD-10-CM | POA: Diagnosis not present

## 2023-12-27 DIAGNOSIS — I700 Atherosclerosis of aorta: Secondary | ICD-10-CM | POA: Diagnosis not present

## 2023-12-27 DIAGNOSIS — C20 Malignant neoplasm of rectum: Secondary | ICD-10-CM | POA: Insufficient documentation

## 2023-12-27 DIAGNOSIS — D6481 Anemia due to antineoplastic chemotherapy: Secondary | ICD-10-CM | POA: Insufficient documentation

## 2023-12-27 DIAGNOSIS — R590 Localized enlarged lymph nodes: Secondary | ICD-10-CM | POA: Insufficient documentation

## 2023-12-27 DIAGNOSIS — Z5111 Encounter for antineoplastic chemotherapy: Secondary | ICD-10-CM | POA: Diagnosis not present

## 2023-12-27 DIAGNOSIS — G629 Polyneuropathy, unspecified: Secondary | ICD-10-CM | POA: Diagnosis not present

## 2023-12-27 DIAGNOSIS — Z9049 Acquired absence of other specified parts of digestive tract: Secondary | ICD-10-CM | POA: Diagnosis not present

## 2023-12-27 DIAGNOSIS — Z86711 Personal history of pulmonary embolism: Secondary | ICD-10-CM

## 2023-12-27 DIAGNOSIS — C799 Secondary malignant neoplasm of unspecified site: Secondary | ICD-10-CM

## 2023-12-27 DIAGNOSIS — T451X5A Adverse effect of antineoplastic and immunosuppressive drugs, initial encounter: Secondary | ICD-10-CM

## 2023-12-27 DIAGNOSIS — S42209A Unspecified fracture of upper end of unspecified humerus, initial encounter for closed fracture: Secondary | ICD-10-CM | POA: Insufficient documentation

## 2023-12-27 DIAGNOSIS — K521 Toxic gastroenteritis and colitis: Secondary | ICD-10-CM | POA: Diagnosis not present

## 2023-12-27 DIAGNOSIS — I82501 Chronic embolism and thrombosis of unspecified deep veins of right lower extremity: Secondary | ICD-10-CM | POA: Diagnosis not present

## 2023-12-27 DIAGNOSIS — Z800 Family history of malignant neoplasm of digestive organs: Secondary | ICD-10-CM | POA: Diagnosis not present

## 2023-12-27 DIAGNOSIS — M25511 Pain in right shoulder: Secondary | ICD-10-CM | POA: Diagnosis not present

## 2023-12-27 DIAGNOSIS — C7951 Secondary malignant neoplasm of bone: Secondary | ICD-10-CM | POA: Insufficient documentation

## 2023-12-27 DIAGNOSIS — G620 Drug-induced polyneuropathy: Secondary | ICD-10-CM | POA: Diagnosis not present

## 2023-12-27 DIAGNOSIS — E7800 Pure hypercholesterolemia, unspecified: Secondary | ICD-10-CM | POA: Insufficient documentation

## 2023-12-27 DIAGNOSIS — Z79899 Other long term (current) drug therapy: Secondary | ICD-10-CM | POA: Diagnosis not present

## 2023-12-27 DIAGNOSIS — Z87891 Personal history of nicotine dependence: Secondary | ICD-10-CM | POA: Insufficient documentation

## 2023-12-27 DIAGNOSIS — I2510 Atherosclerotic heart disease of native coronary artery without angina pectoris: Secondary | ICD-10-CM | POA: Diagnosis not present

## 2023-12-27 DIAGNOSIS — I10 Essential (primary) hypertension: Secondary | ICD-10-CM | POA: Insufficient documentation

## 2023-12-27 DIAGNOSIS — R188 Other ascites: Secondary | ICD-10-CM | POA: Insufficient documentation

## 2023-12-27 DIAGNOSIS — J90 Pleural effusion, not elsewhere classified: Secondary | ICD-10-CM | POA: Diagnosis not present

## 2023-12-27 DIAGNOSIS — K565 Intestinal adhesions [bands], unspecified as to partial versus complete obstruction: Secondary | ICD-10-CM | POA: Insufficient documentation

## 2023-12-27 LAB — CMP (CANCER CENTER ONLY)
ALT: 18 U/L (ref 0–44)
AST: 32 U/L (ref 15–41)
Albumin: 3.4 g/dL — ABNORMAL LOW (ref 3.5–5.0)
Alkaline Phosphatase: 77 U/L (ref 38–126)
Anion gap: 6 (ref 5–15)
BUN: 13 mg/dL (ref 8–23)
CO2: 27 mmol/L (ref 22–32)
Calcium: 9.1 mg/dL (ref 8.9–10.3)
Chloride: 104 mmol/L (ref 98–111)
Creatinine: 0.78 mg/dL (ref 0.44–1.00)
GFR, Estimated: >60 mL/min (ref >60)
Glucose, Bld: 111 mg/dL — ABNORMAL HIGH (ref 70–99)
Potassium: 3.6 mmol/L (ref 3.5–5.1)
Sodium: 137 mmol/L (ref 135–145)
Total Bilirubin: 0.6 mg/dL (ref 0.0–1.2)
Total Protein: 6.6 g/dL (ref 6.5–8.1)

## 2023-12-27 LAB — CBC WITH DIFFERENTIAL (CANCER CENTER ONLY)
Abs Immature Granulocytes: 0.02 10*3/uL (ref 0.00–0.07)
Basophils Absolute: 0 10*3/uL (ref 0.0–0.1)
Basophils Relative: 0 %
Eosinophils Absolute: 0.1 10*3/uL (ref 0.0–0.5)
Eosinophils Relative: 1 %
HCT: 32.4 % — ABNORMAL LOW (ref 36.0–46.0)
Hemoglobin: 10.5 g/dL — ABNORMAL LOW (ref 12.0–15.0)
Immature Granulocytes: 0 %
Lymphocytes Relative: 17 %
Lymphs Abs: 0.8 10*3/uL (ref 0.7–4.0)
MCH: 30.9 pg (ref 26.0–34.0)
MCHC: 32.4 g/dL (ref 30.0–36.0)
MCV: 95.3 fL (ref 80.0–100.0)
Monocytes Absolute: 0.2 10*3/uL (ref 0.1–1.0)
Monocytes Relative: 4 %
Neutro Abs: 3.5 10*3/uL (ref 1.7–7.7)
Neutrophils Relative %: 78 %
Platelet Count: 183 10*3/uL (ref 150–400)
RBC: 3.4 MIL/uL — ABNORMAL LOW (ref 3.87–5.11)
RDW: 17.2 % — ABNORMAL HIGH (ref 11.5–15.5)
WBC Count: 4.6 10*3/uL (ref 4.0–10.5)
nRBC: 0 % (ref 0.0–0.2)

## 2023-12-27 MED ORDER — SODIUM CHLORIDE 0.9 % IV SOLN
INTRAVENOUS | Status: DC
  Filled 2023-12-27: qty 250

## 2023-12-27 MED ORDER — HEPARIN SOD (PORK) LOCK FLUSH 100 UNIT/ML IV SOLN
500.0000 [IU] | Freq: Once | INTRAVENOUS | Status: AC | PRN
  Administered 2023-12-27: 500 [IU]
  Filled 2023-12-27: qty 5

## 2023-12-27 MED ORDER — SODIUM CHLORIDE 0.9 % IV SOLN
5.0000 mg/kg | Freq: Once | INTRAVENOUS | Status: AC
  Administered 2023-12-27: 400 mg via INTRAVENOUS
  Filled 2023-12-27: qty 16

## 2023-12-27 MED ORDER — HYDROMORPHONE HCL 1 MG/ML IJ SOLN
0.2500 mg | Freq: Once | INTRAMUSCULAR | Status: AC
  Administered 2023-12-27: 0.25 mg via INTRAVENOUS
  Filled 2023-12-27: qty 1

## 2023-12-27 NOTE — Progress Notes (Signed)
 Hematology/Oncology Progress note [phone] [phone]      CHIEF COMPLAINTS/REASON FOR VISIT:  Follow up for rectal cancer  ASSESSMENT & PLAN:   Cancer Staging  Rectal cancer [facility]) Staging form: Colon and Rectum, AJCC 8th Edition - Pathologic stage from 10/06/2019: Stage IIC (ypT4b, pN0, cM0) - Signed by Jacqueline Patience, MD on 10/06/2019 - Clinical stage from 06/30/2020: Jacqueline Yoder - Signed by Jacqueline Patience, MD on 04/17/2022 - Pathologic: Stage Unknown (rpTX, pNX, cM1) - Signed by Jacqueline Patience, MD on 01/31/2021   Rectal cancer [facility]) History of stage IIIC Rectal cancer, s/p TNT, followed by 09/17/19 APR/posterior vaginectomy/TAH/BSO/VY-flap, pT4b pN0 with close vaginal margin 0.2 mm.  Uterus and ovaries negative for malignancy. palliative radiation to vaginal recurrence- 01/19/21 recurrence with lung metastasis.-Palliative -FOLFIRI plus bevacizumab.  Irinotecan was dropped in November 2022 due to Yoder effects. Negative for UGT1A1*28 - radiographically stable, rise of CEA-July 2023 CT lung metastasis worse--> Dec 2023 PET showed progression in pelvic lymph nodes and bone lesions,--> 2nd line irinotecan +panitumumab-->CT March 2024 stable. --> June 2024 CT showed mild lung progression/Progressive left iliac bone metastasis, vulvar mass difficult for biopsy in office,  Tempus Liquid biopsy - no actionable variants --> 06/25/2023 CT showed progression of lung nodules [SLD increase 93mm] and iliac inguinal lymphadenopathy--> 07/23/23 switch to dose reduced FOLFOX --> Dec 2024 CT- stable disease. --> Feb 2025 disease progression  Labs are reviewed and discussed with patient   CEA nadir was in 490, progressively increases - 7000s  Finished cycle 1 longsurf [60mg  BID day 1-5 and day 8- 12 every 28-day  + Bevacizumab D1 proceed with D15 Bevacizumab She tolerated well.   Anemia due to antineoplastic chemotherapy Hb stable, observation  Chemotherapy induced diarrhea continue lomotil  QID PRN  as instructed.  She has not needed to take antidiarrhea.   Chemotherapy-induced neuropathy (HCC) Stable symptoms, Grade 1-2.patient copes well.   Encounter for antineoplastic chemotherapy Chemotherapy plan as listed above.   History of pulmonary embolism History of PE and bilateral lower extremity DVT  05/18/23 Repeat right LE US showed chronic non occlusive DVT Continue  Jacqueline Yoder 2.5 mg twice daily for anticoagulation prophylaxis. Recommend leg elevation and compression stocking.   Right shoulder pain Xray of right shoulder was not read. We contacted radiology and official reading was reported after her encounter.  She has been prescribed Tramadol for pain and she is not taking due to concern of Yoder effects.  She is in severe pain currently in my office.  I recommend a dose of Jacqueline Yoder 0.25mg  x 1 along with her treatment here.  Recommend patient to utilize pain medication.   Xray showed possible possible nondisplaced fracture of the proximal humerus. Her PCP has recommended patient to go to  [facility] walk in clinic.  Trauma related vs pathological fracture. Pending ortho evaluation. Consider additonal image work up, rule out metastatic bone lesions.    Follow up 2 weeks.  All questions were answered. The patient knows to call the clinic with any problems, questions or concerns.  Jacqueline Patience, MD, PhD [facility] Hematology Oncology 12/27/2023      HISTORY OF PRESENTING ILLNESS:  Oncology History  Rectal cancer (HCC)  01/23/2019 Initial Diagnosis   Rectal cancer [facility]) Patient initially presented with complaints of postmenopausal bleeding on 08/16/2018.  History of was menopausal vaginal bleeding in 2016 which resulted in cervical polypectomy.  Pathology 02/04/2015 showed cervical polyp, consistent with benign endometrial polyp.  [name] after polypectomy due to  anxiety associated with pelvic exams.  pelvic exam on 08/16/2018 reviewed cervical abnormality and from  enlarged uterus. Seen by Dr. Valentino Yoder on 10/29/2018.  Endometrial biopsy and a Pap smear was performed. 10/29/2018 Pap smear showed adenocarcinoma, favor endometrial origin. 10/29/2018 endometrial biopsy showed endometrioid carcinoma, FIGO grade 1.  10/29/2018- TA & TV Ultrasound revealed: Anteverted uterus measuring 8.7 x 5.6 x 6.4 cm without evidence of focal masses.  The endometrium measuring 24.1 mm (thickened) and heterogeneous.  Right and left ovaries not visualized.  No adnexal masses identified.  No free fluid in cul-de-sac.  Patient was seen by Dr. Sonia Yoder in clinic on 11/13/2018.  Cervical exam reveals 2 cm exophytic irregular mass consistent with malignancy.    11/19/2018 CT chest abdomen pelvis with contrast showed thickened endometrium with some irregularity compatible with the provided diagnosis of endometrial malignancy.  There is a mildly prominent left inguinal node 1.4 cm.  Patient was seen by Dr. Johnnette Yoder on 11/20/2018 and left groin lymph node biopsy was recommended.  11/26/2018 patient underwent left inguinal lymph node biopsy. Pathology showed metastatic adenocarcinoma consistent with colorectal origin.  CDX 2+.  Case was discussed on tumor board.  Recommend colonoscopy for further evaluation.  Patient reports significant weight loss 30 pounds over the last year.  Chronic vaginal spotting. Change of bowel habits the past few months.  More constipated.  Family history positive for brother who has colon cancer prostate cancer.  patient has underwent colonoscopy on 12/03/2018 which reviewed a nonobstructing large mass in the rectum.  Also chronic fistula.  Mass was not circumferential.  This was biopsied with a cold forceps for histology.  Pathology came back hyperplastic polyp negative for dysplasia and malignancy. [name], patient underwent flex sigmoidoscopy on 12/06/2018 with rebiopsy of the rectal mass. This time biopsy results came back positive for  invasive colorectal adenocarcinoma, moderately differentiated. Immunotherapy for nearly mismatch repair protein (MMR ) was performed.  There is no loss of MMR expression.  low probability of MSI high.   # Seen by [name] for evaluation of resectability for rectal cancer. In addition, she also had a second opinion with [facility] where her endometrial biopsy pathology was changed to  adenocarcinoma, consistent with colorectal primary.   [name]. She has home health that has been assisting with ostomy care  Patient was also evaluated by [facility] oncology.  Recommendation is to proceed with TNT with concurrent chemoradiation followed by neoadjuvant chemotherapy followed by surgical resection. Patient prefers to have treatment done locally with [facility].   # Oncology Treatment:  02/03/2019- 03/19/2019  concurrent Xeloda and radiation.  Xeloda dose 825mg  /m2 BID - rounded to 1650mg  BID- on days of radiation. 04/09/2019, started on FOLFOX with bolus early.  Omitted.  07/16/2019 finished 8 cycles of FOLFOX. 09/17/19 APR/posterior vaginectomy/TAH/BSO/VY-flap pT4b pN0 with close vaginal margin 0.2 mm.  Uterus and ovaries negative for malignancy.   10/06/2019 Cancer Staging   Staging form: Colon and Rectum, AJCC 8th Edition - Pathologic stage from 10/06/2019: Stage IIC (ypT4b, pN0, cM0) - Signed by Jacqueline Patience, MD on 10/06/2019   06/30/2020 Cancer Staging   Staging form: Colon and Rectum, AJCC 8th Edition - Clinical stage from 06/30/2020: Jacqueline Yoder - Signed by Jacqueline Patience, MD on 04/17/2022 Stage prefix: Recurrence Total positive nodes: 1   06/30/2020 Relapse/Recurrence   vaginal introitus mass biopsied. Pathology is consistent with metastatic colorectal adenocarcinoma    07/20/2020 - 09/13/2020 Chemotherapy   Concurrent  chemotherapy with Xeloda with Radiation.  08/02/2020-08/16/2020, . Xeloda was held due to neutropenia    08/17/2020 - 09/06/2020 Radiation Therapy   Xeloda  1500 mg twice daily concurrently with radiation    01/31/2021 Cancer Staging   Staging form: Colon and Rectum, AJCC 8th Edition - Pathologic: Stage Unknown (rpTX, pNX, cM1) - Signed by Jacqueline Patience, MD on 01/31/2021 Stage prefix: Recurrence   01/31/2021 - 08/20/2021 Chemotherapy   FOLFIRI + Bev  Irinotecan dose was reduced, eventually 100mg /m2  Irinotecan was dropped in November 2022 due to Yoder effects. Negative for UGT1A1*28    09/13/2021 -  Chemotherapy   maintenance 5-FU/bevacizumab    11/28/2021 -  Chemotherapy   5-FU/Irinotecan/bevacizumab. Irinotecan 100 mg/m2 was added back due to progressively increasing CEA.    07/14/2022 Imaging   Bone scan  1. No evidence of skeletal metastatic disease in the hips. 2. Asymmetric increased activity in the RIGHT humeral head is favored degenerative as described above. If pain in the RIGHT shoulder consider further evaluation with plain film or MRI   07/17/2022 Imaging   1. Stable scattered small bilateral pulmonary nodules, largest 8 by 6 mm in the right middle lobe. 2. Subtle chronically stable posterior cortical irregularity and heterogeneity in the left iliac bone, not appreciable on the bone scan of 07/14/2022. This has been present at least over the past year and could reflect radiation therapy related findings although strictly speaking, malignant involvement of the left iliac bone is difficult to exclude. If clinically warranted this could be further assessed with nuclear medicine PET-CT or MRI of the bony pelvis with and without contrast. 3. Pelvic floor laxity with loops of small bowel extending 3 cm below the pubococcygeal line. 4. Aortic and systemic atherosclerosis as detailed above. Aortic Atherosclerosis    12/26/2022 Imaging   CT chest abdomen pelvis w contrast 1. Stable scattered solid pulmonary nodules. 2. Right inguinal and right pelvic lymph nodes which were hypermetabolic on prior PET-CT are stable. 3. Osseous lesion of the proximal  right humerus demonstrates increased sclerosis when compared with the prior PET-CT, possibly due to treatment response. 4. 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 5. Aortic Atherosclerosis     06/25/2023 Imaging   CT chest abdomen pelvis w contrast showed 1. Increased size of the solid bilateral pulmonary nodules,consistent with worsening metastatic disease. 2. Increased size of the right external iliac and inguinal lymph nodes, concerning for worsening metastatic disease. 3. No significant interval change in the left iliac bone osseous metastasis. 4. Enlarged main pulmonary artery measuring 3.6 cm, which can be seen in the setting of pulmonary arterial hypertension. 5.  Aortic Atherosclerosis (ICD10-I70.0)     Miscellaneous   Tempus liquid biopsy showed no actionable variants.  NOTCH1 missense variant, BRCA2 missense variant , MYC missense variant   07/11/2023 Imaging   PET showed 1. Multifocal tracer avid abdominopelvic lymph nodes are identified compatible with metastatic disease. 2. Multifocal bilateral pulmonary nodules are identified compatible with pulmonary metastasis.  3. Diffuse increased radiotracer uptake throughout the marrow space is identified diminishing sensitivity for detecting osseous metastasis. No abnormal increased radiotracer uptake identified above background increased bone marrow activity to indicate metabolically active bone metastases. 4.  Aortic Atherosclerosis   07/23/2023 - 11/13/2023 Chemotherapy   switch to dose reduced FOLFOX    09/16/2023 Imaging   CT abdomen pelvis w contrast  1. Small-bowel obstruction with transition point in the anterior aspect of the lower abdomen, left of midline. This may be secondary to  adhesions. 2. Small volume ascites. 3. Prior colon resection with left anterior abdominal wall colostomy. Adjacent parastomal hernia. Small volume fluid within the hernia sac. 4. Stable pulmonary metastatic disease. 5. Stable  metastatic lesion of the left iliac wing. 6. Enlarged right external iliac chain lymph node measuring 1.4 cm, previously 1.3 cm. Several mildly prominent retroperitoneal lymph nodes have increased in size from prior but remain subcentimeter in size. 7. Aortic atherosclerosis (ICD10-I70.0).   09/16/2023 - 09/20/2023 Hospital Admission   Hospitalized due to Small bowel obstruction due to adhesions    09/24/2023 Imaging   1. Bilateral pulmonary metastases are all stable from 06/25/2023 chest CT. 2. No thoracic adenopathy. No new or progressive metastatic disease in the chest. 3. Trace bilateral posterior pleural effusions. 4. Two-vessel coronary atherosclerosis. 5. Dilated main pulmonary artery, suggesting pulmonary arterial hypertension. 6.  Aortic Atherosclerosis (ICD10-I70.0).   11/28/2023 Imaging   CT chest abdomen pelvis with contrast 1. Increased size of retroperitoneal, iliac Yoder chain and inguinal lymph nodes, concerning for progressive nodal metastatic disease. 2. Numerous bilateral solid pulmonary nodules are again seen, some of which have increased in size and others are stable in size overall compatible with disease progression. 3. Increased size of the subcapsular lesion in the anterior inferior right lobe of the liver, which has been stable over multiple prior examinations and was not FDG avid on prior PET-CT. Suggest attention on follow-up imaging. 4. 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 5. Questionable wall thickening of matted loops of small bowel in the left lower quadrant, suggest attention on follow-up imaging. 6. Small volume abdominopelvic free fluid. 7.  Aortic Atherosclerosis (ICD10-I70.0).   12/13/2023 -  Chemotherapy   Patient is on Treatment Plan : COLORECTAL Bevacizumab + Trifluridine/Tipiracil q28d     Metastatic adenocarcinoma (HCC)  12/13/2023 -  Chemotherapy   Patient is on Treatment Plan : COLORECTAL  Bevacizumab + Trifluridine/Tipiracil q28d          # Oncology Treatment:  02/03/2019- 03/19/2019  concurrent Xeloda and radiation.  Xeloda dose 825mg  /m2 BID - rounded to 1650mg  BID- on days of radiation. 04/09/2019, started on FOLFOX with bolus early.  Omitted.  07/16/2019 finished 8 cycles of FOLFOX. 09/17/19 APR/posterior vaginectomy/TAH/BSO/VY-flap pT4b pN0 with close vaginal margin 0.2 mm.  Uterus and ovaries negative for malignancy. Patient reports bilateral lower extremity numbness and tingling, intermittent, left worse than right. She has lost a lot of weight since her APR surgery.   #Family history with half brother having's history of colon cancer prostate cancer.  Personal history of colorectal cancer.  Patient has not decided if she wants genetic testing.    # history of PE( 01/13/2020)  in the bilateral lower extremity DVT (01/13/2020).   She finishes 6 months of anticoagulation with Jacqueline Yoder 5 mg twice daily. Now switched to Jacqueline Yoder 2.5 mg twice daily..  # She has now developed recurrent disease. #06/30/20  vaginal introitus mass biopsied. Pathology is consistent with metastatic colorectal adenocarcinoma I have discussed with [name]  Dr. Luciano Cutter and the mass is not resectable. Patient has also had colonoscopy by Dr. Tobi Bastos yesterday. Normal examination. # 07/16/2020 cycle 1 FOLFIRI  # 07/20/2020 PET scan was done for further evaluation, images are consistent with local recurrence, no distant metastasis. #Discussed with radiation oncology Dr. Rushie Chestnut will recommends concurrent chemotherapy and radiation. 08/02/2020-08/16/2020, patient starts radiation.  Xeloda was held due to neutropenia 08/17/2020,-09/06/2020 Xeloda 1500 mg twice daily concurrently with radiation  01/31/21 started on  FOLFIRI + Bev 05/18/2021 CT chest abdomen pelvis showed Previously noted enlargement of bilateral inguinal lymph nodes is resolved, consistent with treatment response of nodal metastatic disease. Interval  decrease in size of multiple small bilateral pulmonary nodules, consistent with treatment response of pulmonary metastatic disease. No evidence of new metastatic disease. 05/24/2021 - 08/30/2021, continued on FOLFIRI plus bevacizumab.  Irinotecan dose was reduced, eventually 100mg /m2  09/02/2021, CT chest abdomen pelvis without contrast Showed small bilateral pulmonary nodules, unchanged.  Stable metastatic disease.  No noncontrast evidence of new metastatic disease in the chest abdomen pelvis.  Small parastomal hernia.  Enlargement of main pulmonary artery.  Coronary artery disease.  09/13/2021, maintenance 5-FU/bevacizumab 11/28/2021, 5-FU/Irinotecan/bevacizumab.  Irinotecan 100 mg/m2 was added back due to progressively increasing CEA.  -right lower extremity edema, US showed non occlusive DVT, edema has improved.   INTERVAL HISTORY Jacqueline Yoder is a 78 y.o. female who has above history reviewed by me presents for follow-up of rectal cancer. On Irinotecan and panitumumab. she tolerates well.   She was accompanied by her daughter. + manageable neuropathy symptoms of her fingertips, intermittent,  worse when exposed to cold temperature.  + chronic loose ostomy output, stable, not worse.  + Right shoulder pain, progressively getting worse, she is in acute pain in my office, rate pain as 9 out of 10. Her PCP has prescribed Tramadol and Zanaflex  Stable weight.    Review of Systems  Constitutional:  Positive for fatigue. Negative for appetite change, chills, fever and unexpected weight change.  HENT:   Negative for hearing loss and voice change.   Eyes:  Negative for eye problems.  Respiratory:  Negative for chest tightness and cough.   Cardiovascular:  Negative for chest pain.  Gastrointestinal:  Positive for diarrhea. Negative for abdominal distention, abdominal pain, blood in stool, constipation and nausea.  Endocrine: Negative for hot flashes.  Genitourinary:  Negative for difficulty  urinating and frequency.   Musculoskeletal:  Positive for arthralgias.       Progressively worse right shoulder pain.   Skin:  Negative for itching.  Neurological:  Positive for numbness. Negative for extremity weakness.  Hematological:  Negative for adenopathy.  Psychiatric/Behavioral:  Negative for confusion.     MEDICAL HISTORY:  Past Medical History:  Diagnosis Date   Allergy    Arthritis    Blood clot in vein    Family history of colon cancer    GERD (gastroesophageal reflux disease)    Hypercholesteremia    Hypertension    Hypertension    Lower extremity edema    Personal history of chemotherapy    Rectal cancer (HCC) 12/2018   Urinary incontinence     SURGICAL HISTORY: Past Surgical History:  Procedure Laterality Date   ABDOMINAL HYSTERECTOMY     CHOLECYSTECTOMY  1971   COLONOSCOPY WITH PROPOFOL N/A 12/03/2018   Procedure: COLONOSCOPY WITH PROPOFOL;  Surgeon: Midge Minium, MD;  Location: [facility] ENDOSCOPY;  Service: Endoscopy;  Laterality: N/A;   COLONOSCOPY WITH PROPOFOL N/A 07/15/2020   Procedure: COLONOSCOPY WITH PROPOFOL;  Surgeon: Wyline Mood, MD;  Location: [facility] ENDOSCOPY;  Service: Gastroenterology;  Laterality: N/A;   FLEXIBLE SIGMOIDOSCOPY N/A 12/06/2018   Procedure: FLEXIBLE SIGMOIDOSCOPY;  Surgeon: Wyline Mood, MD;  Location: [facility] ENDOSCOPY;  Service: Endoscopy;  Laterality: N/A;   LAPAROSCOPIC COLOSTOMY  01/06/2019   PORTACATH PLACEMENT N/A 04/03/2019   Procedure: INSERTION PORT-A-CATH;  Surgeon: Leafy Ro, MD;  Location: [facility] ORS;  Service: General;  Laterality: N/A;    SOCIAL  HISTORY: Social History   Socioeconomic History   Marital status: Widowed    Spouse name: Not on file   Number of children: Not on file   Years of education: Not on file   Highest education level: Some college, no degree  Occupational History   Not on file  Tobacco Use   Smoking status: Former    Current packs/day: 0.00    Types: Cigarettes    Quit date: 12/02/1977     Years since quitting: 46.0   Smokeless tobacco: Former  Building services engineer status: Never Used  Substance and Sexual Activity   Alcohol use: Never   Drug use: Never   Sexual activity: Not Currently    Birth control/protection: None  Other Topics Concern   Not on file  Social History Narrative   Lives with daughter   Social Drivers of Health   Financial Resource Strain: Low Risk  (11/15/2023)   Overall Financial Resource Strain (CARDIA)    Difficulty of Paying Living Expenses: Not hard at all  Food Insecurity: No Food Insecurity (11/15/2023)   Hunger Vital Sign    Worried About Running Out of Food in the Last Year: Never true    Ran Out of Food in the Last Year: Never true  Transportation Needs: No Transportation Needs (11/15/2023)   PRAPARE - Administrator, Civil Service (Medical): No    Lack of Transportation (Non-Medical): No  Physical Activity: Insufficiently Active (11/15/2023)   Exercise Vital Sign    Days of Exercise per Week: 3 days    Minutes of Exercise per Session: 10 min  Stress: No Stress Concern Present (11/15/2023)   [employer] of Occupational Health - Occupational Stress Questionnaire    Feeling of Stress : Not at all  Social Connections: Moderately Integrated (11/15/2023)   Social Connection and Isolation Panel [NHANES]    Frequency of Communication with Friends and Family: More than three times a week    Frequency of Social Gatherings with Friends and Family: More than three times a week    Attends Religious Services: More than 4 times per year    Active Member of [employer] or Organizations: Yes    Attends Banker Meetings: More than 4 times per year    Marital Status: Widowed  Intimate Partner Violence: Not At Risk (09/27/2023)   Humiliation, Afraid, Rape, and Kick questionnaire    Fear of Current or Ex-Partner: No    Emotionally Abused: No    Physically Abused: No    Sexually Abused: No    FAMILY HISTORY: Family History   Problem Relation Age of Onset   Colon cancer Brother 45       exposure to chemicals [place]   Hypertension Mother    Stroke Mother    Kidney failure Father    Breast cancer Neg Hx    Ovarian cancer Neg Hx     ALLERGIES:  is allergic to sulfamethoxazole-trimethoprim.  MEDICATIONS:  Current Outpatient Medications  Medication Sig Dispense Refill   acetaminophen (TYLENOL) 650 MG CR tablet Take 650 mg by mouth every 8 (eight) hours as needed for pain.     Cholecalciferol (VITAMIN D3) 2000 units capsule Take 2,000 Units by mouth daily.     clindamycin (CLINDAGEL) 1 % gel Apply topically 2 (two) times daily. 30 g 5   diclofenac sodium (VOLTAREN) 1 % GEL Apply 2 g topically 4 (four) times daily as needed (joint pain).  11   diphenoxylate-atropine (LOMOTIL) 2.5-0.025  MG tablet Take 1 tablet by mouth 4 (four) times daily as needed for diarrhea or loose stools. 120 tablet 0   Jacqueline Yoder 2.5 MG TABS tablet TAKE 1 TABLET BY MOUTH TWICE  DAILY 200 tablet 2   fluticasone (FLONASE) 50 MCG/ACT nasal spray Place 1 spray into both nostrils daily as needed for allergies or rhinitis.     ketoconazole (NIZORAL) 2 % cream Apply 1 Application topically daily. For up to 2 weeks. May repeat if need. 30 g 2   lidocaine-prilocaine (EMLA) cream Apply 1 Application topically daily as needed. Apply small amount to port and cover with saran wrap 1-2 hours prior to port access 30 g 3   loratadine (CLARITIN) 10 MG tablet Take 10 mg by mouth daily.     magnesium chloride (SLOW-MAG) 64 MG TBEC SR tablet Take 1 tablet (64 mg total) by mouth 2 (two) times daily. 60 tablet 2   Multiple Vitamins-Minerals (ONE-A-DAY WOMENS 50 PLUS PO) Take 1 tablet by mouth daily.      potassium chloride SA (KLOR-CON M) 20 MEQ tablet TAKE 1 TABLET BY MOUTH DAILY 100 tablet 2   prochlorperazine (COMPAZINE) 10 MG tablet Take 1 tablet (10 mg total) by mouth every 6 (six) hours as needed (NAUSEA). 30 tablet 1   simvastatin (ZOCOR) 40 MG tablet Take  1 tablet (40 mg total) by mouth at bedtime. 100 tablet 3   triamcinolone cream (KENALOG) 0.5 % Apply 1 Application topically 2 (two) times daily. 90 g 1   triamterene-hydrochlorothiazide (DYAZIDE) 37.5-25 MG capsule Take 1 each (1 capsule total) by mouth daily. 90 capsule 1   trifluridine-tipiracil (LONSURF) 20-8.19 MG tablet Take 3 tablets (60 mg of trifluridine total) by mouth 2 (two) times daily. 1hr after AM & PM meals days 1-5, 8-12. Repeat every 28d. 60 tablet 1   zinc gluconate 50 MG tablet Take 50 mg by mouth daily.     gentamicin cream (GARAMYCIN) 0.1 % Apply to affected toe once daily. (Patient not taking: Reported on 12/21/2023) 30 g 1   nystatin (MYCOSTATIN/NYSTOP) powder APPLY 1 POWDER TOPICALLY THREE TIMES DAILY (Patient not taking: No sig reported) 45 g 0   ondansetron (ZOFRAN) 8 MG tablet Take 1 tablet (8 mg total) by mouth every 8 (eight) hours as needed for nausea or vomiting. (Patient not taking: Reported on 12/27/2023) 30 tablet 1   tiZANidine (ZANAFLEX) 4 MG tablet Take 0.5 tablets (2 mg total) by mouth every 8 (eight) hours as needed for muscle spasms. (Patient not taking: Reported on 12/27/2023) 10 tablet 0   No current facility-administered medications for this visit.   Facility-Administered Medications Ordered in Other Visits  Medication Dose Route Frequency Provider Last Rate Last Admin   0.9 %  sodium chloride infusion   Intravenous Continuous Jacqueline Patience, MD   Stopped at 12/27/23 1055   heparin lock flush 100 UNIT/ML injection              PHYSICAL EXAMINATION: ECOG PERFORMANCE STATUS: 1 - Symptomatic but completely ambulatory  Physical Exam Constitutional:      General: She is in acute distress.  HENT:     Head: Normocephalic and atraumatic.  Eyes:     General: No scleral icterus. Cardiovascular:     Rate and Rhythm: Normal rate.     Heart sounds: Normal heart sounds.  Pulmonary:     Effort: Pulmonary effort is normal. No respiratory distress.     Breath  sounds: No wheezing.  Abdominal:  General: Bowel sounds are normal. There is no distension.     Palpations: Abdomen is soft.     Comments: + Colostomy bag   Musculoskeletal:        General: No deformity. Normal range of motion.     Cervical back: Normal range of motion and neck supple.     Comments: Right shoulder tenderness. Range of motion limited due to pain.   Skin:    General: Skin is warm and dry.  Neurological:     Mental Status: She is alert and oriented to person, place, and time. Mental status is at baseline.       LABORATORY DATA:  I have reviewed the data as listed    Latest Ref Rng & Units 12/27/2023    8:41 AM 12/13/2023    8:25 AM 12/04/2023    8:14 AM  CBC  WBC 4.0 - 10.5 K/uL 4.6  5.6  5.5   Hemoglobin 12.0 - 15.0 g/dL 82.9  56.2  13.0   Hematocrit 36.0 - 46.0 % 32.4  34.5  37.3   Platelets 150 - 400 K/uL 183  230  186       Latest Ref Rng & Units 12/27/2023    8:41 AM 12/13/2023    8:25 AM 12/04/2023    8:14 AM  CMP  Glucose 70 - 99 mg/dL 865  784  696   BUN 8 - 23 mg/dL 13  16  18    Creatinine 0.44 - 1.00 mg/dL 2.95  2.84  1.32   Sodium 135 - 145 mmol/L 137  138  136   Potassium 3.5 - 5.1 mmol/L 3.6  3.9  4.0   Chloride 98 - 111 mmol/L 104  101  99   CO2 22 - 32 mmol/L 27  28  28    Calcium 8.9 - 10.3 mg/dL 9.1  9.5  9.4   Total Protein 6.5 - 8.1 g/dL 6.6  7.2  7.2   Total Bilirubin 0.0 - 1.2 mg/dL 0.6  0.5  0.6   Alkaline Phos 38 - 126 U/L 77  75  78   AST 15 - 41 U/L 32  50  46   ALT 0 - 44 U/L 18  27  24       RADIOGRAPHIC STUDIES: I have personally reviewed the radiological images as listed and agreed with the findings in the report. DG Shoulder Right Result Date: 12/27/2023 CLINICAL DATA:  Chronic right shoulder pain without known injury. EXAM: RIGHT SHOULDER - 2+ VIEW COMPARISON:  None Available. FINDINGS: Possible nondisplaced fracture involving greater tuberosity. No dislocation is noted. Moderate degenerative changes are seen involving the  right glenohumeral and acromioclavicular joints. Possible Hill-Sachs deformity suggesting prior dislocations. IMPRESSION: Possible nondisplaced fracture involving greater tuberosity of proximal humerus. Degenerative changes as noted above. Electronically Signed   By: Lupita Raider M.D.   On: 12/27/2023 09:51   VAS [place] LOWER EXTREMITY VENOUS REFLUX Result Date: 12/21/2023  Lower Venous Reflux Study Patient Name:  SHATONA ANDUJAR  Date of Exam:   12/20/2023 Medical Rec #: [id number]       Accession #:    [id number] Date of Birth: 1946/03/26       Patient Gender: F Patient Age:   66 years Exam Location:  [facility] Procedure:      VAS [place] LOWER EXTREMITY VENOUS REFLUX Referring Phys: Sheppard Plumber --------------------------------------------------------------------------------  Indications: Pain, Swelling, Edema, and rt knee.  Performing Technologist: Salvadore Farber RVT  Examination Guidelines:  A complete evaluation includes B-mode imaging, spectral Doppler, color Doppler, and power Doppler as needed of all accessible portions of each vessel. Bilateral testing is considered an integral part of a complete examination. Limited examinations for reoccurring indications may be performed as noted. The reflux portion of the exam is performed with the patient in reverse Trendelenburg. Significant venous reflux is defined as >500 ms in the superficial venous system, and >1 second in the deep venous system.   Summary: Right: - No evidence of deep vein thrombosis seen in the right lower extremity, from the common femoral through the popliteal veins. - No evidence of superficial venous thrombosis in the right lower extremity. - There is no evidence of venous reflux seen in the right lower extremity. - No evidence of superficial venous reflux seen in the right greater saphenous vein. - No evidence of superficial venous reflux seen in the right short saphenous vein.  *See table(s) above for measurements and observations.  Electronically signed by Levora Dredge MD on 12/21/2023 at 8:33:40 AM.    Final    CT CHEST ABDOMEN PELVIS W CONTRAST Result Date: 11/28/2023 CLINICAL DATA:  Rectal cancer, follow-up.  * Tracking Code: BO * EXAM: CT CHEST, ABDOMEN, AND PELVIS WITH CONTRAST TECHNIQUE: Multidetector CT imaging of the chest, abdomen and pelvis was performed following the standard protocol during bolus administration of intravenous contrast. RADIATION DOSE REDUCTION: This exam was performed according to the departmental dose-optimization program which includes automated exposure control, adjustment of the mA and/or kV according to patient size and/or use of iterative reconstruction technique. CONTRAST:  85mL OMNIPAQUE IOHEXOL 300 MG/ML  SOLN COMPARISON:  Multiple priors including CT September 24, 2023 FINDINGS: CT CHEST FINDINGS Cardiovascular: Right chest Port-A-Cath with tip in the right atrium. Thoracic aortic atherosclerosis. Dilated main pulmonary artery. Coronary artery calcifications. Normal size heart. Mediastinum/Nodes: Prominent supraclavicular lymph nodes measure 9 mm on image 6/2 previously 7 mm. No pathologically enlarged mediastinal, hilar or axillary lymph nodes. No suspicious thyroid nodule.  Tiny hiatal hernia. Lungs/Pleura: Numerous bilateral solid pulmonary nodules are again seen. Some of which have increased in size and others are stable in size. For reference:: -medial right middle lobe pulmonary nodule measures 18 x 16 mm on image 95/3 previously 16 x 14 mm. -anterior left lower lobe pulmonary nodule measures 7 mm on image 111/3, unchanged. -left upper lobe pulmonary nodule measures 9 mm on image 65/3 previously 7 mm. Musculoskeletal: No aggressive lytic or blastic lesion of bone. Multilevel degenerative change of the spine. CT ABDOMEN PELVIS FINDINGS Hepatobiliary: Increased size of the subcapsular lesion in the anterior inferior right lobe of the liver now measuring 10 mm on image 69/2 previously 8 mm.  Gallbladder is surgically absent. No biliary ductal dilation. Pancreas: No pancreatic ductal dilation or evidence of acute inflammation. Spleen: No splenomegaly.  Stable splenic lymphangioma/cysts. Adrenals/Urinary Tract: No suspicious adrenal nodule. Stable bilateral renal cysts. No hydronephrosis. Urinary bladder is unremarkable for degree of distension. Stomach/Bowel: Radiopaque enteric contrast material traverses the transverse colon. Stomach is unremarkable for degree of distension. No pathologic dilation of small or large bowel. Questionable wall thickening of matted loops of small bowel in the left lower quadrant on image 89/2. Prior partial colectomy and rectal resection with left anterior abdominal wall colostomy. Fluid and soft tissue stranding in the mesorectal/presacral space without discrete enhancing soft tissue nodularity. Vascular/Lymphatic: Aortic atherosclerosis. Increased size of retroperitoneal, iliac Yoder chain and inguinal lymph nodes. For reference: Left periaortic lymph node measures 12 mm in short axis  on image 66/2 previously 9 mm. -right external iliac lymph node measuring 18 mm in short axis on image 98/2 previously measured 14 mm -Right pelvic sidewall lymph node measures 10 mm in short axis on image 93/2 previously 7 mm. Reproductive: Uterus is surgically absent. Other: Small volume abdominopelvic free fluid. Postsurgical change in the abdominal wall. Musculoskeletal: Similar appearance of the permeative lytic metastatic lesion in the left iliac wing. No new aggressive lytic or blastic lesion of bone. Multilevel degenerative change of the spine. Demineralization of bone. IMPRESSION: 1. Increased size of retroperitoneal, iliac Yoder chain and inguinal lymph nodes, concerning for progressive nodal metastatic disease. 2. Numerous bilateral solid pulmonary nodules are again seen, some of which have increased in size and others are stable in size overall compatible with disease progression.  3. Increased size of the subcapsular lesion in the anterior inferior right lobe of the liver, which has been stable over multiple prior examinations and was not FDG avid on prior PET-CT. Suggest attention on follow-up imaging. 4. 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 5. Questionable wall thickening of matted loops of small bowel in the left lower quadrant, suggest attention on follow-up imaging. 6. Small volume abdominopelvic free fluid. 7.  Aortic Atherosclerosis (ICD10-I70.0). Electronically Signed   By: Maudry Mayhew M.D.   On: 11/28/2023 15:20   DG Bone Density Result Date: 11/19/2023 EXAM: DUAL X-RAY ABSORPTIOMETRY (DXA) FOR BONE MINERAL DENSITY IMPRESSION: Your patient Zandra Lajeunesse completed a BMD test on 11/19/2023 using the [employer] DXA System (software version: 14.10) manufactured by [employer]. The following summarizes the results of our evaluation. Technologist:VLM PATIENT BIOGRAPHICAL: Name: Kymberli, Wiegand Patient ID: [id number] Birth Date: 11/11/1945 Height: 64.0 in. Gender: Female Exam Date: 11/19/2023 Weight: 171.2 lbs. Indications: Chemo, Colon CA, Hysterectomy Fractures: Treatments: Vitamin D DENSITOMETRY RESULTS: Site      Region        Measured Date Measured Age WHO Classification Young Adult T-score BMD         %Change vs. Previous Significant Change (*) AP Spine L1-L4 (L2,L3) 11/19/2023 76.6 Normal 0.2 1.203 g/cm2 - - DualFemur Total Right 11/19/2023 76.6 Normal -0.5 0.946 g/cm2 - - ASSESSMENT: The BMD measured at Femur Total Right is 0.946 g/cm2 with a T-score of -0.5. This patient is considered normal according to World Health Organization [facility]) criteria. L-2 & L-3 were excluded due to degenerative changes. The scan quality is good. World Science writer [facility]) criteria for post-menopausal, Caucasian Women: Normal:                   T-score at or above -1 SD Osteopenia/low bone mass: T-score between -1 and -2.5 SD  Osteoporosis:             T-score at or below -2.5 SD RECOMMENDATIONS: 1. All patients should optimize calcium and vitamin D intake. 2. Consider FDA-approved medical therapies in postmenopausal women and men aged 58 years and older, based on the following: a. A hip or vertebral(clinical or morphometric) fracture b. T-score < -2.5 at the femoral neck or spine after appropriate evaluation to exclude secondary causes c. Low bone mass (T-score between -1.0 and -2.5 at the femoral neck or spine) and a 10-year probability of a hip fracture > 3% or a 10-year probability of a major osteoporosis-related fracture > 20% based on the [place]-adapted WHO algorithm 3. Clinician judgment and/or patient preferences may indicate treatment for people with 10-year fracture probabilities above or below  these levels FOLLOW-UP: People with diagnosed cases of osteoporosis or at high risk for fracture should have regular bone mineral density tests. For patients eligible for Medicare, routine testing is allowed once every 2 years. The testing frequency can be increased to one year for patients who have rapidly progressing disease, those who are receiving or discontinuing medical therapy to restore bone mass, or have additional risk factors. I have reviewed this report, and agree with the above findings. [facility] Electronically Signed   By: Frederico Hamman M.D.   On: 11/19/2023 10:00      DG Shoulder Right Result Date: 12/27/2023 CLINICAL DATA:  Chronic right shoulder pain without known injury. EXAM: RIGHT SHOULDER - 2+ VIEW COMPARISON:  None Available. FINDINGS: Possible nondisplaced fracture involving greater tuberosity. No dislocation is noted. Moderate degenerative changes are seen involving the right glenohumeral and acromioclavicular joints. Possible Hill-Sachs deformity suggesting prior dislocations. IMPRESSION: Possible nondisplaced fracture involving greater tuberosity of proximal humerus. Degenerative changes  as noted above. Electronically Signed   By: Lupita Raider M.D.   On: 12/27/2023 09:51   VAS [place] LOWER EXTREMITY VENOUS REFLUX Result Date: 12/21/2023  Lower Venous Reflux Study Patient Name:  TALANI BRAZEE  Date of Exam:   12/20/2023 Medical Rec #: [id number]       Accession #:    [id number] Date of Birth: 1946/07/15       Patient Gender: F Patient Age:   22 years Exam Location:  [facility] Procedure:      VAS [place] LOWER EXTREMITY VENOUS REFLUX Referring Phys: Sheppard Plumber --------------------------------------------------------------------------------  Indications: Pain, Swelling, Edema, and rt knee.  Performing Technologist: Salvadore Farber RVT  Examination Guidelines: A complete evaluation includes B-mode imaging, spectral Doppler, color Doppler, and power Doppler as needed of all accessible portions of each vessel. Bilateral testing is considered an integral part of a complete examination. Limited examinations for reoccurring indications may be performed as noted. The reflux portion of the exam is performed with the patient in reverse Trendelenburg. Significant venous reflux is defined as >500 ms in the superficial venous system, and >1 second in the deep venous system.   Summary: Right: - No evidence of deep vein thrombosis seen in the right lower extremity, from the common femoral through the popliteal veins. - No evidence of superficial venous thrombosis in the right lower extremity. - There is no evidence of venous reflux seen in the right lower extremity. - No evidence of superficial venous reflux seen in the right greater saphenous vein. - No evidence of superficial venous reflux seen in the right short saphenous vein.  *See table(s) above for measurements and observations. Electronically signed by Levora Dredge MD on 12/21/2023 at 8:33:40 AM.    Final    CT CHEST ABDOMEN PELVIS W CONTRAST Result Date: 11/28/2023 CLINICAL DATA:  Rectal cancer, follow-up.  * Tracking Code: BO * EXAM: CT CHEST,  ABDOMEN, AND PELVIS WITH CONTRAST TECHNIQUE: Multidetector CT imaging of the chest, abdomen and pelvis was performed following the standard protocol during bolus administration of intravenous contrast. RADIATION DOSE REDUCTION: This exam was performed according to the departmental dose-optimization program which includes automated exposure control, adjustment of the mA and/or kV according to patient size and/or use of iterative reconstruction technique. CONTRAST:  85mL OMNIPAQUE IOHEXOL 300 MG/ML  SOLN COMPARISON:  Multiple priors including CT September 24, 2023 FINDINGS: CT CHEST FINDINGS Cardiovascular: Right chest Port-A-Cath with tip in the right atrium. Thoracic aortic atherosclerosis. Dilated main pulmonary artery.  Coronary artery calcifications. Normal size heart. Mediastinum/Nodes: Prominent supraclavicular lymph nodes measure 9 mm on image 6/2 previously 7 mm. No pathologically enlarged mediastinal, hilar or axillary lymph nodes. No suspicious thyroid nodule.  Tiny hiatal hernia. Lungs/Pleura: Numerous bilateral solid pulmonary nodules are again seen. Some of which have increased in size and others are stable in size. For reference:: -medial right middle lobe pulmonary nodule measures 18 x 16 mm on image 95/3 previously 16 x 14 mm. -anterior left lower lobe pulmonary nodule measures 7 mm on image 111/3, unchanged. -left upper lobe pulmonary nodule measures 9 mm on image 65/3 previously 7 mm. Musculoskeletal: No aggressive lytic or blastic lesion of bone. Multilevel degenerative change of the spine. CT ABDOMEN PELVIS FINDINGS Hepatobiliary: Increased size of the subcapsular lesion in the anterior inferior right lobe of the liver now measuring 10 mm on image 69/2 previously 8 mm. Gallbladder is surgically absent. No biliary ductal dilation. Pancreas: No pancreatic ductal dilation or evidence of acute inflammation. Spleen: No splenomegaly.  Stable splenic lymphangioma/cysts. Adrenals/Urinary Tract: No suspicious  adrenal nodule. Stable bilateral renal cysts. No hydronephrosis. Urinary bladder is unremarkable for degree of distension. Stomach/Bowel: Radiopaque enteric contrast material traverses the transverse colon. Stomach is unremarkable for degree of distension. No pathologic dilation of small or large bowel. Questionable wall thickening of matted loops of small bowel in the left lower quadrant on image 89/2. Prior partial colectomy and rectal resection with left anterior abdominal wall colostomy. Fluid and soft tissue stranding in the mesorectal/presacral space without discrete enhancing soft tissue nodularity. Vascular/Lymphatic: Aortic atherosclerosis. Increased size of retroperitoneal, iliac Yoder chain and inguinal lymph nodes. For reference: Left periaortic lymph node measures 12 mm in short axis on image 66/2 previously 9 mm. -right external iliac lymph node measuring 18 mm in short axis on image 98/2 previously measured 14 mm -Right pelvic sidewall lymph node measures 10 mm in short axis on image 93/2 previously 7 mm. Reproductive: Uterus is surgically absent. Other: Small volume abdominopelvic free fluid. Postsurgical change in the abdominal wall. Musculoskeletal: Similar appearance of the permeative lytic metastatic lesion in the left iliac wing. No new aggressive lytic or blastic lesion of bone. Multilevel degenerative change of the spine. Demineralization of bone. IMPRESSION: 1. Increased size of retroperitoneal, iliac Yoder chain and inguinal lymph nodes, concerning for progressive nodal metastatic disease. 2. Numerous bilateral solid pulmonary nodules are again seen, some of which have increased in size and others are stable in size overall compatible with disease progression. 3. Increased size of the subcapsular lesion in the anterior inferior right lobe of the liver, which has been stable over multiple prior examinations and was not FDG avid on prior PET-CT. Suggest attention on follow-up imaging. 4. Similar  appearance of the permeative lytic metastatic lesion in the left iliac wing. 5. Questionable wall thickening of matted loops of small bowel in the left lower quadrant, suggest attention on follow-up imaging. 6. Small volume abdominopelvic free fluid. 7.  Aortic Atherosclerosis (ICD10-I70.0). Electronically Signed   By: Maudry Mayhew M.D.   On: 11/28/2023 15:20   DG Bone Density Result Date: 11/19/2023 EXAM: DUAL X-RAY ABSORPTIOMETRY (DXA) FOR BONE MINERAL DENSITY IMPRESSION: Your patient Adine Heimann completed a BMD test on 11/19/2023 using the [employer] DXA System (software version: 14.10) manufactured by [employer]. The following summarizes the results of our evaluation. Technologist:VLM PATIENT BIOGRAPHICAL: Name: Zaidee, Rion Patient ID: [id number] Birth Date: 14-May-1946 Height: 64.0 in. Gender: Female Exam Date: 11/19/2023 Weight: 171.2 lbs.  Indications: Chemo, Colon CA, Hysterectomy Fractures: Treatments: Vitamin D DENSITOMETRY RESULTS: Site      Region        Measured Date Measured Age WHO Classification Young Adult T-score BMD         %Change vs. Previous Significant Change (*) AP Spine L1-L4 (L2,L3) 11/19/2023 76.6 Normal 0.2 1.203 g/cm2 - - DualFemur Total Right 11/19/2023 76.6 Normal -0.5 0.946 g/cm2 - - ASSESSMENT: The BMD measured at Femur Total Right is 0.946 g/cm2 with a T-score of -0.5. This patient is considered normal according to World Health Organization [facility]) criteria. L-2 & L-3 were excluded due to degenerative changes. The scan quality is good. World Science writer [facility]) criteria for post-menopausal, Caucasian Women: Normal:                   T-score at or above -1 SD Osteopenia/low bone mass: T-score between -1 and -2.5 SD Osteoporosis:             T-score at or below -2.5 SD RECOMMENDATIONS: 1. All patients should optimize calcium and vitamin D intake. 2. Consider FDA-approved medical therapies in postmenopausal women and men aged 40 years and older, based on the  following: a. A hip or vertebral(clinical or morphometric) fracture b. T-score < -2.5 at the femoral neck or spine after appropriate evaluation to exclude secondary causes c. Low bone mass (T-score between -1.0 and -2.5 at the femoral neck or spine) and a 10-year probability of a hip fracture > 3% or a 10-year probability of a major osteoporosis-related fracture > 20% based on the [place]-adapted WHO algorithm 3. Clinician judgment and/or patient preferences may indicate treatment for people with 10-year fracture probabilities above or below these levels FOLLOW-UP: People with diagnosed cases of osteoporosis or at high risk for fracture should have regular bone mineral density tests. For patients eligible for Medicare, routine testing is allowed once every 2 years. The testing frequency can be increased to one year for patients who have rapidly progressing disease, those who are receiving or discontinuing medical therapy to restore bone mass, or have additional risk factors. I have reviewed this report, and agree with the above findings. [facility] Electronically Signed   By: Frederico Hamman M.D.   On: 11/19/2023 10:00

## 2023-12-27 NOTE — Assessment & Plan Note (Signed)
 History of PE and bilateral lower extremity DVT  05/18/23 Repeat right LE US showed chronic non occlusive DVT Continue  Eliquis 2.5 mg twice daily for anticoagulation prophylaxis. Recommend leg elevation and compression stocking.

## 2023-12-27 NOTE — Assessment & Plan Note (Signed)
 continue lomotil  QID PRN as instructed.  She has not needed to take antidiarrhea.

## 2023-12-27 NOTE — Assessment & Plan Note (Addendum)
 History of stage IIIC Rectal cancer, s/p TNT, followed by 09/17/19 APR/posterior vaginectomy/TAH/BSO/VY-flap, pT4b pN0 with close vaginal margin 0.2 mm.  Uterus and ovaries negative for malignancy. palliative radiation to vaginal recurrence- 01/19/21 recurrence with lung metastasis.-Palliative -FOLFIRI plus bevacizumab.  Irinotecan was dropped in November 2022 due to side effects. Negative for UGT1A1*28 - radiographically stable, rise of CEA-July 2023 CT lung metastasis worse--> Dec 2023 PET showed progression in pelvic lymph nodes and bone lesions,--> 2nd line irinotecan +panitumumab-->CT March 2024 stable. --> June 2024 CT showed mild lung progression/Progressive left iliac bone metastasis, vulvar mass difficult for biopsy in office,  Tempus Liquid biopsy - no actionable variants --> 06/25/2023 CT showed progression of lung nodules [SLD increase 66mm] and iliac inguinal lymphadenopathy--> 07/23/23 switch to dose reduced FOLFOX --> Dec 2024 CT- stable disease. --> Feb 2025 disease progression  Labs are reviewed and discussed with patient   CEA nadir was in 490, progressively increases - 7000s  Finished cycle 1 longsurf [60mg  BID day 1-5 and day 8- 12 every 28-day  + Bevacizumab D1 proceed with D15 Bevacizumab She tolerated well.

## 2023-12-27 NOTE — Patient Instructions (Signed)
 [facility] - A DEPT OF [facility]  Discharge Instructions: Thank you for choosing [facility] to provide your oncology and hematology care.  If you have a lab appointment with the Cancer Center, please go directly to the Cancer Center and check in at the registration area.  Wear comfortable clothing and clothing appropriate for easy access to any Portacath or PICC line.   We strive to give you quality time with your provider. You may need to reschedule your appointment if you arrive late (15 or more minutes).  Arriving late affects you and other patients whose appointments are after yours.  Also, if you miss three or more appointments without notifying the office, you may be dismissed from the clinic at the provider's discretion.      For prescription refill requests, have your pharmacy contact our office and allow 72 hours for refills to be completed.    Today you received the following chemotherapy and/or immunotherapy agents- bevacizumab      To help prevent nausea and vomiting after your treatment, we encourage you to take your nausea medication as directed.  BELOW ARE SYMPTOMS THAT SHOULD BE REPORTED IMMEDIATELY: *FEVER GREATER THAN 100.4 F (38 C) OR HIGHER *CHILLS OR SWEATING *NAUSEA AND VOMITING THAT IS NOT CONTROLLED WITH YOUR NAUSEA MEDICATION *UNUSUAL SHORTNESS OF BREATH *UNUSUAL BRUISING OR BLEEDING *URINARY PROBLEMS (pain or burning when urinating, or frequent urination) *BOWEL PROBLEMS (unusual diarrhea, constipation, pain near the anus) TENDERNESS IN MOUTH AND THROAT WITH OR WITHOUT PRESENCE OF ULCERS (sore throat, sores in mouth, or a toothache) UNUSUAL RASH, SWELLING OR PAIN  UNUSUAL VAGINAL DISCHARGE OR ITCHING   Items with * indicate a potential emergency and should be followed up as soon as possible or go to the Emergency Department if any problems should occur.  Please show the CHEMOTHERAPY ALERT CARD or IMMUNOTHERAPY  ALERT CARD at check-in to the Emergency Department and triage nurse.  Should you have questions after your visit or need to cancel or reschedule your appointment, please contact [facility] - A DEPT OF [facility]  [phone] and follow the prompts.  Office hours are 8:00 a.m. to 4:30 p.m. Monday - Friday. Please note that voicemails left after 4:00 p.m. may not be returned until the following business day.  We are closed weekends and major holidays. You have access to a nurse at all times for urgent questions. Please call the main number to the clinic [phone] and follow the prompts.  For any non-urgent questions, you may also contact your provider using MyChart. We now offer e-Visits for anyone 22 and older to request care online for non-urgent symptoms. For details visit [email or web address].   Also download the MyChart app! Go to the app store, search "MyChart", open the app, select [place], and log in with your MyChart username and password.

## 2023-12-27 NOTE — Assessment & Plan Note (Signed)
 Hb stable, observation

## 2023-12-27 NOTE — Assessment & Plan Note (Signed)
 Chemotherapy plan as listed above

## 2023-12-27 NOTE — Assessment & Plan Note (Addendum)
 Xray of right shoulder was not read. We contacted radiology and official reading was reported after her encounter.  She has been prescribed Tramadol for pain and she is not taking due to concern of side effects.  She is in severe pain currently in my office.  I recommend a dose of dilaudid 0.25mg  x 1 along with her treatment here.  Recommend patient to utilize pain medication.   Xray showed possible possible nondisplaced fracture of the proximal humerus. Her PCP has recommended patient to go to  [facility] walk in clinic.  Trauma related vs pathological fracture. Pending ortho evaluation. Consider additonal image work up, rule out metastatic bone lesions.

## 2023-12-27 NOTE — Assessment & Plan Note (Signed)
 Stable symptoms, Grade 1-2.patient copes well.

## 2024-01-01 ENCOUNTER — Other Ambulatory Visit: Payer: Self-pay

## 2024-01-01 ENCOUNTER — Other Ambulatory Visit: Payer: Self-pay | Admitting: Pharmacy Technician

## 2024-01-01 DIAGNOSIS — C792 Secondary malignant neoplasm of skin: Secondary | ICD-10-CM | POA: Diagnosis not present

## 2024-01-01 DIAGNOSIS — L309 Dermatitis, unspecified: Secondary | ICD-10-CM | POA: Diagnosis not present

## 2024-01-01 DIAGNOSIS — C189 Malignant neoplasm of colon, unspecified: Secondary | ICD-10-CM | POA: Diagnosis not present

## 2024-01-01 NOTE — Progress Notes (Signed)
 Specialty Pharmacy Refill Coordination Note  Jacqueline Yoder is a 78 y.o. female contacted today regarding refills of specialty medication(s) Trifluridine-Tipiracil (LONSURF)   Patient requested Delivery   Delivery date: 01/04/24   Verified address: [address]  [place]   Medication will be filled on 01/03/24.

## 2024-01-01 NOTE — Progress Notes (Signed)
 Specialty Pharmacy Ongoing Clinical Assessment Note  Jacqueline Yoder is a 78 y.o. female who is being followed by the specialty pharmacy service for RxSp Oncology   Patient's specialty medication(s) reviewed today: Trifluridine-Tipiracil (LONSURF)   Missed doses in the last 4 weeks: 0   Patient/Caregiver did not have any additional questions or concerns.   Therapeutic benefit summary: Patient is achieving benefit   Adverse events/side effects summary: Experienced adverse events/side effects (fatigue)   Patient's therapy is appropriate to: Continue    Goals Addressed             This Visit's Progress    Slow Disease Progression   No change    Patient is initiating therapy. Patient will maintain adherence         Follow up:  3 months  Servando Snare Specialty Pharmacist

## 2024-01-07 ENCOUNTER — Encounter: Payer: Self-pay | Admitting: Oncology

## 2024-01-08 ENCOUNTER — Other Ambulatory Visit (HOSPITAL_COMMUNITY): Payer: Self-pay

## 2024-01-08 ENCOUNTER — Ambulatory Visit (INDEPENDENT_AMBULATORY_CARE_PROVIDER_SITE_OTHER): Payer: 59 | Admitting: Family Medicine

## 2024-01-08 ENCOUNTER — Other Ambulatory Visit: Payer: Self-pay | Admitting: Family Medicine

## 2024-01-08 ENCOUNTER — Encounter: Payer: Self-pay | Admitting: Family Medicine

## 2024-01-08 VITALS — BP 139/80 | HR 93 | Ht 64.0 in | Wt 157.0 lb

## 2024-01-08 DIAGNOSIS — R7309 Other abnormal glucose: Secondary | ICD-10-CM

## 2024-01-08 DIAGNOSIS — I129 Hypertensive chronic kidney disease with stage 1 through stage 4 chronic kidney disease, or unspecified chronic kidney disease: Secondary | ICD-10-CM | POA: Diagnosis not present

## 2024-01-08 DIAGNOSIS — C799 Secondary malignant neoplasm of unspecified site: Secondary | ICD-10-CM | POA: Diagnosis not present

## 2024-01-08 DIAGNOSIS — N182 Chronic kidney disease, stage 2 (mild): Secondary | ICD-10-CM

## 2024-01-08 DIAGNOSIS — G8929 Other chronic pain: Secondary | ICD-10-CM | POA: Diagnosis not present

## 2024-01-08 DIAGNOSIS — M25552 Pain in left hip: Secondary | ICD-10-CM

## 2024-01-08 DIAGNOSIS — E782 Mixed hyperlipidemia: Secondary | ICD-10-CM

## 2024-01-08 DIAGNOSIS — M1612 Unilateral primary osteoarthritis, left hip: Secondary | ICD-10-CM

## 2024-01-08 DIAGNOSIS — S42294D Other nondisplaced fracture of upper end of right humerus, subsequent encounter for fracture with routine healing: Secondary | ICD-10-CM

## 2024-01-08 NOTE — Patient Instructions (Addendum)
 Thank you for coming to the office today.  Keep up with Ortho tomorrow and their treatment plan.  Continue w/ Oncology and current treatment  We did review the dermatology biopsy and diagnosis.   DUE for FASTING BLOOD WORK (no food or drink after midnight before the lab appointment, only water or coffee without cream/sugar on the morning of)  SCHEDULE "Lab Only" visit in the morning at the clinic for lab draw in 6 MONTHS   - Make sure Lab Only appointment is at about 1 week before your next appointment, so that results will be available  For Lab Results, once available within 2-3 days of blood draw, you can can log in to MyChart online to view your results and a brief explanation. Also, we can discuss results at next follow-up visit.   Please schedule a Follow-up Appointment to: Return for 6 month fasting lab > 1 week later Annual Physical.  If you have any other questions or concerns, please feel free to call the office or send a message through MyChart. You may also schedule an earlier appointment if necessary.  Additionally, you may be receiving a survey about your experience at our office within a few days to 1 week by e-mail or mail. We value your feedback.  Saralyn Pilar, DO [facility], [place]

## 2024-01-08 NOTE — Progress Notes (Signed)
 Subjective:    Patient ID: Jacqueline Yoder, female    DOB: [birth year], 78 y.o.   MRN: [id number]  Jacqueline Yoder is a 78 y.o. female presenting on 01/08/2024 for Colon Cancer (metastatic)   HPI  Discussed the use of AI scribe software for clinical note transcription with the patient, who gave verbal consent to proceed.  History of Present Illness   Jacqueline Yoder is a 78 year old female with metastatic colon cancer who presents for follow-up of her right shoulder pain and recent dermatological findings. She is accompanied by her daughter.  She is experiencing right shoulder pain, initially suspected to be bursitis or arthritis. An x-ray revealed a fracture, back on 12/21/23 after eval, question etiology since she has not experienced any falls. She was placed in a sling by [facility], which has alleviated her symptoms. She was prescribed muscle relaxants and pain pills but has only taken the pain pill once and has not used the muscle relaxant. - Upcoming apt with [facility] again tomorrow 3/26 for further management - Has Tramadol but not taking often  Oncology She has a history of metastatic colon cancer, which has recently spread to her skin, confirmed by a punch biopsy from [facility]. She is currently on an oral chemotherapy drug, Lonsurf, and receives chemotherapy IV every other Thursday. She reports no side effects from the chemotherapy.  Her blood pressure has been stable, ranging from 130s to low 140s systolic. She has lost some weight but attributes it to dietary restrictions rather than a lack of appetite. She manages her diet with snacks like jello, peaches, and applesauce, and occasionally has oatmeal despite dietary advice against it. She has not experienced diarrhea and manages her bowel movements with dietary adjustments.  She is currently taking triamterene and potassium supplements, which are up to date. She has not required any additional medications or refills at  this time.      Oncologist - Dr [name]) Radiation Oncologist - Dr Carmina Miller [facility])   Venous Insufficiency Lower Extremity Left leg mild swelling Not having redness, heat, pain Previous similar issue before   CHRONIC HTN: Hypokalemia BP controlled Current Meds - Triamterene-HCTZ 37.5-25mg  daily. She is on Potassium Supplement daily Reports good compliance, took meds today. Tolerating well, w/o complaints. Denies CP, dyspnea, HA, edema, dizziness / lightheadedness   Chronic DVT / on Chronic Anticoagulation On Eliquis 2.5mg  daily for anticoagulation   Left Hip Pain, Osteoarthritis Chronic problem with multiple joints with osteoarthritis in knees etc, and recently she admits flare up Left hip with some pain bothering her. She had X-ray done by Oncology Dr Cathie Hoops and it also showed some osteoarthritic changes. She has treated with Tylenol and using cane to assist.   Candidal Intertrigo Reports skin folds has used topical treatment with improvement Taking Nystatin powder previously Ketoconazole cream PRN   Gluteal / Sacral Pressure Sore Stage 1 She has Home Health wound care managing pressure sore. No worsening or new concerns.   Eczema, secondary to chemotherapy Using topical steroid therapy and topical moisturizer   -----------------------     01/08/2024   10:49 AM 09/26/2023    1:59 PM 07/06/2023   10:35 AM  Depression screen PHQ 2/9  Decreased Interest 0 0 0  Down, Depressed, Hopeless 0 0 0  PHQ - 2 Score 0 0 0  Altered sleeping 0  0  Tired, decreased energy 0  0  Change in appetite 0  0  Feeling bad or failure about yourself  0  0  Trouble concentrating 0  0  Moving slowly or fidgety/restless 0  0  Suicidal thoughts 0  0  PHQ-9 Score 0  0  Difficult doing work/chores Not difficult at all  Not difficult at all       01/08/2024   10:49 AM 09/26/2023    1:59 PM 06/11/2023    2:48 PM 01/03/2023    9:25 AM  GAD 7 : Generalized Anxiety Score   Nervous, Anxious, on Edge 0 0 0 0  Control/stop worrying 0 0 0 0  Worry too much - different things 0 0 0 0  Trouble relaxing 0 0 0 0  Restless 0 0 0 0  Easily annoyed or irritable 0 0 0 0  Afraid - awful might happen 0 0 0 0  Total GAD 7 Score 0 0 0 0  Anxiety Difficulty Not difficult at all  Not difficult at all Not difficult at all    Social History   Tobacco Use   Smoking status: Former    Current packs/day: 0.00    Types: Cigarettes    Quit date: 12/02/1977    Years since quitting: 46.1   Smokeless tobacco: Former  Building services engineer status: Never Used  Substance Use Topics   Alcohol use: Never   Drug use: Never    Review of Systems Per HPI unless specifically indicated above     Objective:    BP 139/80 (BP Location: Left Arm, Cuff Size: Normal)   Pulse 93   Ht 5\' 4"  (1.626 m)   Wt 157 lb (71.2 kg)   SpO2 98%   BMI 26.95 kg/m   Wt Readings from Last 3 Encounters:  01/08/24 157 lb (71.2 kg)  12/27/23 160 lb 8 oz (72.8 kg)  12/21/23 160 lb 3.2 oz (72.7 kg)    Physical Exam Vitals and nursing note reviewed.  Constitutional:      General: She is not in acute distress.    Appearance: She is well-developed. She is not diaphoretic.     Comments: Well-appearing, comfortable, cooperative  HENT:     Head: Normocephalic and atraumatic.  Eyes:     General:        Right eye: No discharge.        Left eye: No discharge.     Conjunctiva/sclera: Conjunctivae normal.  Neck:     Thyroid: No thyromegaly.     Vascular: No carotid bruit.  Cardiovascular:     Rate and Rhythm: Normal rate and regular rhythm.     Heart sounds: Normal heart sounds. No murmur heard. Pulmonary:     Effort: Pulmonary effort is normal. No respiratory distress.     Breath sounds: No wheezing or rales.  Abdominal:     Comments: Colostomy bag in place  Musculoskeletal:        General: Normal range of motion.     Cervical back: Normal range of motion and neck supple.     Right lower leg:  No edema.     Left lower leg: No edema.  Lymphadenopathy:     Cervical: No cervical adenopathy.  Skin:    General: Skin is warm and dry.     Findings: No erythema or rash.  Neurological:     Mental Status: She is alert and oriented to person, place, and time.  Psychiatric:        Behavior: Behavior normal.     Comments: Well  groomed, good eye contact, normal speech and thoughts       I have personally reviewed the radiology report from 12/21/23 Right Shoulder.  CLINICAL DATA:  Chronic right shoulder pain without known injury.   EXAM: RIGHT SHOULDER - 2+ VIEW   COMPARISON:  None Available.   FINDINGS: Possible nondisplaced fracture involving greater tuberosity. No dislocation is noted. Moderate degenerative changes are seen involving the right glenohumeral and acromioclavicular joints. Possible Hill-Sachs deformity suggesting prior dislocations.   IMPRESSION: Possible nondisplaced fracture involving greater tuberosity of proximal humerus. Degenerative changes as noted above.     Electronically Signed   By: Lupita Raider M.D.   On: 12/27/2023 09:51  Results for orders placed or performed in visit on 12/27/23  CBC with Differential (Cancer Center Only)   Collection Time: 12/27/23  8:41 AM  Result Value Ref Range   WBC Count 4.6 4.0 - 10.5 K/uL   RBC 3.40 (L) 3.87 - 5.11 MIL/uL   Hemoglobin 10.5 (L) 12.0 - 15.0 g/dL   HCT 16.1 (L) 09.6 - 04.5 %   MCV 95.3 80.0 - 100.0 fL   MCH 30.9 26.0 - 34.0 pg   MCHC 32.4 30.0 - 36.0 g/dL   RDW 40.9 (H) 81.1 - 91.4 %   Platelet Count 183 150 - 400 K/uL   nRBC 0.0 0.0 - 0.2 %   Neutrophils Relative % 78 %   Neutro Abs 3.5 1.7 - 7.7 K/uL   Lymphocytes Relative 17 %   Lymphs Abs 0.8 0.7 - 4.0 K/uL   Monocytes Relative 4 %   Monocytes Absolute 0.2 0.1 - 1.0 K/uL   Eosinophils Relative 1 %   Eosinophils Absolute 0.1 0.0 - 0.5 K/uL   Basophils Relative 0 %   Basophils Absolute 0.0 0.0 - 0.1 K/uL   Immature Granulocytes 0 %    Abs Immature Granulocytes 0.02 0.00 - 0.07 K/uL  CMP (Cancer Center only)   Collection Time: 12/27/23  8:41 AM  Result Value Ref Range   Sodium 137 135 - 145 mmol/L   Potassium 3.6 3.5 - 5.1 mmol/L   Chloride 104 98 - 111 mmol/L   CO2 27 22 - 32 mmol/L   Glucose, Bld 111 (H) 70 - 99 mg/dL   BUN 13 8 - 23 mg/dL   Creatinine 7.82 9.56 - 1.00 mg/dL   Calcium 9.1 8.9 - 21.3 mg/dL   Total Protein 6.6 6.5 - 8.1 g/dL   Albumin 3.4 (L) 3.5 - 5.0 g/dL   AST 32 15 - 41 U/L   ALT 18 0 - 44 U/L   Alkaline Phosphatase 77 38 - 126 U/L   Total Bilirubin 0.6 0.0 - 1.2 mg/dL   GFR, Estimated >08 >65 mL/min   Anion gap 6 5 - 15   *Note: Due to a large number of results and/or encounters for the requested time period, some results have not been displayed. A complete set of results can be found in Results Review.      Assessment & Plan:   Problem List Items Addressed This Visit     Benign hypertension with CKD (chronic kidney disease), stage II   Metastatic adenocarcinoma (HCC) - Primary   Other Visit Diagnoses       Primary osteoarthritis of left hip         Chronic left hip pain            Metastatic colon adenocarcinoma Followed by [facility] Dr Cathie Hoops New update Colon adenocarcinoma  with dermal metastasis confirmed by biopsy On chemotherapy Consult oncologist Dr. Cathie Hoops this week for further management.  Right shoulder fracture Recently identified 12/21/23 Fracture in right shoulder identified via x-ray Has upcoming follow up with Orthopedic, [facility] Tomorrow 3/26 for fracture assessment and management options.  Hypertension Blood pressure well-controlled with triamterene and potassium supplements. - Continue triamterene and potassium supplements.  General Health Maintenance Weight loss due to dietary restrictions. Managing diet to control diarrhea without medication. - Continue dietary management for diarrhea control. - Monitor weight and dietary intake.        No orders of the  defined types were placed in this encounter.   No orders of the defined types were placed in this encounter.   Follow up plan: Return for 6 month fasting lab > 1 week later Annual Physical.  Future labs ordered for 07/09/24   Saralyn Pilar, DO [facility] 01/08/2024, 11:03 AM

## 2024-01-09 ENCOUNTER — Ambulatory Visit
Admission: RE | Admit: 2024-01-09 | Discharge: 2024-01-09 | Disposition: A | Discharge status: Home / Self Care | Payer: 59 | Source: Ambulatory Visit | Attending: Radiation Oncology | Admitting: Radiation Oncology

## 2024-01-09 VITALS — BP 113/68 | HR 85 | Temp 99.9°F | Resp 16

## 2024-01-09 DIAGNOSIS — Z923 Personal history of irradiation: Secondary | ICD-10-CM | POA: Insufficient documentation

## 2024-01-09 DIAGNOSIS — C20 Malignant neoplasm of rectum: Secondary | ICD-10-CM | POA: Diagnosis not present

## 2024-01-09 DIAGNOSIS — C7951 Secondary malignant neoplasm of bone: Secondary | ICD-10-CM | POA: Diagnosis not present

## 2024-01-09 DIAGNOSIS — M25511 Pain in right shoulder: Secondary | ICD-10-CM | POA: Diagnosis not present

## 2024-01-09 NOTE — Progress Notes (Signed)
 Radiation Oncology Follow up Note  Name: Jacqueline Yoder   Date:   01/09/2024 MRN:  [id number] DOB: 09/30/1946    This 78 y.o. female presents to the clinic today for follow-up and evaluation of metastatic disease in patient with stage IV adenocarcinoma of the rectum treated back in 21 for stage IIIc adenocarcinoma.Marland Kitchen  REFERRING PROVIDER: Saralyn Pilar *  HPI: Patient is a 78 year old female seen back in September 2024 for vaginal bleeding.  She had no target lesion on scans and I declined to do palliative treatment.  Her bleeding has stopped.  She is gone on to develop metastatic disease with area of skin involvement overall wide swath of her right inguinal region.  She also has apparently a pathologic fracture of her right humeral head..  She has known bilateral pulmonary nodules consistent with metastatic disease.  She was in extreme pain her right shoulder was x-rayed showed nondisplaced fracture involving the greater tuberosity of the proximal humerus.  She is also having a burning sensation in the right inguinal area.  She had recently been on FOLFOX.  She is seen orthopedic surgeon and they are awaiting our opinion on further treatment.  COMPLICATIONS OF TREATMENT: none  FOLLOW UP COMPLIANCE: keeps appointments   PHYSICAL EXAM:  BP 113/68 (BP Location: Left Arm, Patient Position: Sitting, Cuff Size: Normal)   Pulse 85   Temp 99.9 F (37.7 C) (Tympanic)   Resp 16   SpO2 100%  Patient's arm is in a sling.  There is pain on even slight movement of her arm.  She also an area of skin involvement in the right inguinal area of a large swath of her skin.  Well-developed well-nourished patient in NAD. HEENT reveals PERLA, EOMI, discs not visualized.  Oral cavity is clear. No oral mucosal lesions are identified. Neck is clear without evidence of cervical or supraclavicular adenopathy. Lungs are clear to A&P. Cardiac examination is essentially unremarkable with regular rate and rhythm  without murmur rub or thrill. Abdomen is benign with no organomegaly or masses noted. Motor sensory and DTR levels are equal and symmetric in the upper and lower extremities. Cranial nerves II through XII are grossly intact. Proprioception is intact. No peripheral adenopathy or edema is identified. No motor or sensory levels are noted. Crude visual fields are within normal range.  RADIOLOGY RESULTS: PET CT scan ordered  PLAN: This time of ordered a PET CT scan to better delineate over your areas of involvement of her metastatic disease.  Should her right humerus show hypermetabolic activity would offer palliative treatment to that area.  We also may consider radiation therapy to her skin metastasis although we will discuss that after final analysis of her PET CT scan.  Will also discuss her case with Dr. Cathie Hoops and make further recommendations after PET scan results.  Will see her in follow-up shortly after her PET scan.  I would like to take this opportunity to thank you for allowing me to participate in the care of your patient.Carmina Miller, MD

## 2024-01-10 ENCOUNTER — Inpatient Hospital Stay

## 2024-01-10 ENCOUNTER — Inpatient Hospital Stay: Admitting: Oncology

## 2024-01-10 ENCOUNTER — Encounter: Payer: Self-pay | Admitting: Oncology

## 2024-01-10 VITALS — BP 120/69 | HR 90 | Temp 98.6°F | Resp 19 | Wt 156.3 lb

## 2024-01-10 DIAGNOSIS — C799 Secondary malignant neoplasm of unspecified site: Secondary | ICD-10-CM | POA: Diagnosis not present

## 2024-01-10 DIAGNOSIS — G620 Drug-induced polyneuropathy: Secondary | ICD-10-CM | POA: Diagnosis not present

## 2024-01-10 DIAGNOSIS — I10 Essential (primary) hypertension: Secondary | ICD-10-CM | POA: Diagnosis not present

## 2024-01-10 DIAGNOSIS — C792 Secondary malignant neoplasm of skin: Secondary | ICD-10-CM

## 2024-01-10 DIAGNOSIS — Z800 Family history of malignant neoplasm of digestive organs: Secondary | ICD-10-CM | POA: Diagnosis not present

## 2024-01-10 DIAGNOSIS — J90 Pleural effusion, not elsewhere classified: Secondary | ICD-10-CM | POA: Diagnosis not present

## 2024-01-10 DIAGNOSIS — C7951 Secondary malignant neoplasm of bone: Secondary | ICD-10-CM | POA: Diagnosis not present

## 2024-01-10 DIAGNOSIS — Z5111 Encounter for antineoplastic chemotherapy: Secondary | ICD-10-CM | POA: Diagnosis not present

## 2024-01-10 DIAGNOSIS — T451X5A Adverse effect of antineoplastic and immunosuppressive drugs, initial encounter: Secondary | ICD-10-CM

## 2024-01-10 DIAGNOSIS — M25511 Pain in right shoulder: Secondary | ICD-10-CM | POA: Diagnosis not present

## 2024-01-10 DIAGNOSIS — Z86711 Personal history of pulmonary embolism: Secondary | ICD-10-CM | POA: Diagnosis not present

## 2024-01-10 DIAGNOSIS — E7800 Pure hypercholesterolemia, unspecified: Secondary | ICD-10-CM | POA: Diagnosis not present

## 2024-01-10 DIAGNOSIS — Z87891 Personal history of nicotine dependence: Secondary | ICD-10-CM | POA: Diagnosis not present

## 2024-01-10 DIAGNOSIS — D6481 Anemia due to antineoplastic chemotherapy: Secondary | ICD-10-CM | POA: Diagnosis not present

## 2024-01-10 DIAGNOSIS — R188 Other ascites: Secondary | ICD-10-CM | POA: Diagnosis not present

## 2024-01-10 DIAGNOSIS — I2510 Atherosclerotic heart disease of native coronary artery without angina pectoris: Secondary | ICD-10-CM | POA: Diagnosis not present

## 2024-01-10 DIAGNOSIS — K521 Toxic gastroenteritis and colitis: Secondary | ICD-10-CM | POA: Diagnosis not present

## 2024-01-10 DIAGNOSIS — C20 Malignant neoplasm of rectum: Secondary | ICD-10-CM | POA: Diagnosis not present

## 2024-01-10 DIAGNOSIS — Z79899 Other long term (current) drug therapy: Secondary | ICD-10-CM | POA: Diagnosis not present

## 2024-01-10 DIAGNOSIS — R590 Localized enlarged lymph nodes: Secondary | ICD-10-CM | POA: Diagnosis not present

## 2024-01-10 DIAGNOSIS — G629 Polyneuropathy, unspecified: Secondary | ICD-10-CM | POA: Diagnosis not present

## 2024-01-10 DIAGNOSIS — S42294D Other nondisplaced fracture of upper end of right humerus, subsequent encounter for fracture with routine healing: Secondary | ICD-10-CM

## 2024-01-10 DIAGNOSIS — I700 Atherosclerosis of aorta: Secondary | ICD-10-CM | POA: Diagnosis not present

## 2024-01-10 DIAGNOSIS — Z9049 Acquired absence of other specified parts of digestive tract: Secondary | ICD-10-CM | POA: Diagnosis not present

## 2024-01-10 DIAGNOSIS — K565 Intestinal adhesions [bands], unspecified as to partial versus complete obstruction: Secondary | ICD-10-CM | POA: Diagnosis not present

## 2024-01-10 DIAGNOSIS — I82501 Chronic embolism and thrombosis of unspecified deep veins of right lower extremity: Secondary | ICD-10-CM | POA: Diagnosis not present

## 2024-01-10 LAB — CBC WITH DIFFERENTIAL (CANCER CENTER ONLY)
Abs Immature Granulocytes: 0.01 10*3/uL (ref 0.00–0.07)
Basophils Absolute: 0 10*3/uL (ref 0.0–0.1)
Basophils Relative: 0 %
Eosinophils Absolute: 0 10*3/uL (ref 0.0–0.5)
Eosinophils Relative: 1 %
HCT: 33.1 % — ABNORMAL LOW (ref 36.0–46.0)
Hemoglobin: 10.3 g/dL — ABNORMAL LOW (ref 12.0–15.0)
Immature Granulocytes: 0 %
Lymphocytes Relative: 43 %
Lymphs Abs: 1.3 10*3/uL (ref 0.7–4.0)
MCH: 30.4 pg (ref 26.0–34.0)
MCHC: 31.1 g/dL (ref 30.0–36.0)
MCV: 97.6 fL (ref 80.0–100.0)
Monocytes Absolute: 0.6 10*3/uL (ref 0.1–1.0)
Monocytes Relative: 22 %
Neutro Abs: 1 10*3/uL — ABNORMAL LOW (ref 1.7–7.7)
Neutrophils Relative %: 34 %
Platelet Count: 252 10*3/uL (ref 150–400)
RBC: 3.39 MIL/uL — ABNORMAL LOW (ref 3.87–5.11)
RDW: 18 % — ABNORMAL HIGH (ref 11.5–15.5)
WBC Count: 3 10*3/uL — ABNORMAL LOW (ref 4.0–10.5)
nRBC: 0 % (ref 0.0–0.2)

## 2024-01-10 LAB — CMP (CANCER CENTER ONLY)
ALT: 18 U/L (ref 0–44)
AST: 33 U/L (ref 15–41)
Albumin: 3.4 g/dL — ABNORMAL LOW (ref 3.5–5.0)
Alkaline Phosphatase: 86 U/L (ref 38–126)
Anion gap: 11 (ref 5–15)
BUN: 17 mg/dL (ref 8–23)
CO2: 27 mmol/L (ref 22–32)
Calcium: 9.3 mg/dL (ref 8.9–10.3)
Chloride: 100 mmol/L (ref 98–111)
Creatinine: 0.94 mg/dL (ref 0.44–1.00)
GFR, Estimated: >60 mL/min (ref >60)
Glucose, Bld: 115 mg/dL — ABNORMAL HIGH (ref 70–99)
Potassium: 3.9 mmol/L (ref 3.5–5.1)
Sodium: 138 mmol/L (ref 135–145)
Total Bilirubin: 0.6 mg/dL (ref 0.0–1.2)
Total Protein: 7.4 g/dL (ref 6.5–8.1)

## 2024-01-10 LAB — PROTEIN, URINE, RANDOM: Total Protein, Urine: 32 mg/dL

## 2024-01-10 MED ORDER — HEPARIN SOD (PORK) LOCK FLUSH 100 UNIT/ML IV SOLN
500.0000 [IU] | Freq: Once | INTRAVENOUS | Status: AC
  Administered 2024-01-10: 500 [IU] via INTRAVENOUS
  Filled 2024-01-10: qty 5

## 2024-01-10 NOTE — Assessment & Plan Note (Signed)
 History of PE and bilateral lower extremity DVT  05/18/23 Repeat right LE US showed chronic non occlusive DVT Continue  Eliquis 2.5 mg twice daily for anticoagulation prophylaxis. Recommend leg elevation and compression stocking.

## 2024-01-10 NOTE — Assessment & Plan Note (Addendum)
?   Pathological fracture Pending PET scan evaluation.  Possible radiation if she has significant hypermetabolic activity

## 2024-01-10 NOTE — Assessment & Plan Note (Signed)
 S/p skin nodule biopsy confirmed metastatic adenocarcinoma consistent with colorectal origin.  She will see radonc for evaluation.

## 2024-01-10 NOTE — Assessment & Plan Note (Addendum)
 History of stage IIIC Rectal cancer, s/p TNT, followed by 09/17/19 APR/posterior vaginectomy/TAH/BSO/VY-flap, pT4b pN0 with close vaginal margin 0.2 mm.  Uterus and ovaries negative for malignancy. palliative radiation to vaginal recurrence- 01/19/21 recurrence with lung metastasis.-Palliative -FOLFIRI plus bevacizumab.  Irinotecan was dropped in November 2022 due to side effects. Negative for UGT1A1*28 - radiographically stable, rise of CEA-July 2023 CT lung metastasis worse--> Dec 2023 PET showed progression in pelvic lymph nodes and bone lesions,--> 2nd line irinotecan +panitumumab-->CT March 2024 stable. --> June 2024 CT showed mild lung progression/Progressive left iliac bone metastasis, vulvar mass difficult for biopsy in office,  Tempus Liquid biopsy - no actionable variants --> 06/25/2023 CT showed progression of lung nodules [SLD increase 30mm] and iliac inguinal lymphadenopathy--> 07/23/23 switch to dose reduced FOLFOX --> Dec 2024 CT- stable disease. --> Feb 2025 disease progression  Labs are reviewed and discussed with patient   CEA nadir was in 490, progressively increases - 7000s  Finished cycle 1 longsurf [60mg  BID day 1-5 and day 8- 12 every 28-day  + Bevacizumab D1 proceed with D15 Bevacizumab Hold off starting cycle 2 due to neutropenia. Re-assess in 1 week.

## 2024-01-10 NOTE — Progress Notes (Signed)
 Hematology/Oncology Progress note [phone] [phone]      CHIEF COMPLAINTS/REASON FOR VISIT:  Follow up for rectal cancer  ASSESSMENT & PLAN:   Cancer Staging  Rectal cancer [facility]) Staging form: Colon and Rectum, AJCC 8th Edition - Pathologic stage from 10/06/2019: Stage IIC (ypT4b, pN0, cM0) - Signed by [name], MD on 10/06/2019 - Clinical stage from 06/30/2020: [name] - Signed by [name], MD on 04/17/2022 - Pathologic: Stage Unknown (rpTX, pNX, cM1) - Signed by [name], MD on 01/31/2021   Rectal cancer [facility] History of stage IIIC Rectal cancer, s/p TNT, followed by 09/17/19 APR/posterior vaginectomy/TAH/BSO/VY-flap, pT4b pN0 with close vaginal margin 0.2 mm.  Uterus and ovaries negative for malignancy. palliative radiation to vaginal recurrence- 01/19/21 recurrence with lung metastasis.-Palliative -FOLFIRI plus bevacizumab.  Irinotecan was dropped in November 2022 due to side effects. Negative for UGT1A1*28 - radiographically stable, rise of CEA-July 2023 CT lung metastasis worse--> Dec 2023 PET showed progression in pelvic lymph nodes and bone lesions,--> 2nd line irinotecan +panitumumab-->CT March 2024 stable. --> June 2024 CT showed mild lung progression/Progressive left iliac bone metastasis, vulvar mass difficult for biopsy in office,  Tempus Liquid biopsy - no actionable variants --> 06/25/2023 CT showed progression of lung nodules [SLD increase 56mm] and iliac inguinal lymphadenopathy--> 07/23/23 switch to dose reduced FOLFOX --> Dec 2024 CT- stable disease. --> Feb 2025 disease progression  Labs are reviewed and discussed with patient   CEA nadir was in 490, progressively increases - 7000s  Finished cycle 1 longsurf [60mg  BID day 1-5 and day 8- 12 every 28-day  + Bevacizumab D1 proceed with D15 Bevacizumab Hold off starting cycle 2 due to neutropenia. Re-assess in 1 week.   Anemia due to antineoplastic chemotherapy Hb stable,  observation  Chemotherapy induced diarrhea continue lomotil  QID PRN as instructed.  She has not needed to take antidiarrhea.   Chemotherapy-induced neuropathy (HCC) Stable symptoms, Grade 1-2.patient copes well.   History of pulmonary embolism History of PE and bilateral lower extremity DVT  05/18/23 Repeat right LE US showed chronic non occlusive DVT Continue  Eliquis 2.5 mg twice daily for anticoagulation prophylaxis. Recommend leg elevation and compression stocking.   Metastasis to skin [facility]) S/p skin nodule biopsy confirmed metastatic adenocarcinoma consistent with colorectal origin.  She will see radonc for evaluation.   Proximal humerus fracture ? Pathological fracture Pending PET scan evaluation.  Possible radiation if she has significant hypermetabolic activity   Follow up 1 week[s].  All questions were answered. The patient knows to call the clinic with any problems, questions or concerns.  [name], MD, PhD [facility] Hematology Oncology 01/10/2024      HISTORY OF PRESENTING ILLNESS:  Oncology History  Rectal cancer [facility])  01/23/2019 Initial Diagnosis   Rectal cancer [facility]) Patient initially presented with complaints of postmenopausal bleeding on 08/16/2018.  History of was menopausal vaginal bleeding in 2016 which resulted in cervical polypectomy.  Pathology 02/04/2015 showed cervical polyp, consistent with benign endometrial polyp.  [name] after polypectomy due to anxiety associated with pelvic exams.  pelvic exam on 08/16/2018 reviewed cervical abnormality and from enlarged uterus. Seen by Dr. [name] on 10/29/2018.  Endometrial biopsy and a Pap smear was performed. 10/29/2018 Pap smear showed adenocarcinoma, favor endometrial origin. 10/29/2018 endometrial biopsy showed endometrioid carcinoma, FIGO grade 1.  10/29/2018- TA & TV Ultrasound revealed: Anteverted uterus measuring 8.7 x 5.6 x 6.4 cm without evidence of focal masses.  The endometrium measuring  24.1 mm (  thickened) and heterogeneous.  Right and left ovaries not visualized.  No adnexal masses identified.  No free fluid in cul-de-sac.  Patient was seen by Dr. [name] in clinic on 11/13/2018.  Cervical exam reveals 2 cm exophytic irregular mass consistent with malignancy.    11/19/2018 CT chest abdomen pelvis with contrast showed thickened endometrium with some irregularity compatible with the provided diagnosis of endometrial malignancy.  There is a mildly prominent left inguinal node 1.4 cm.  Patient was seen by Dr. [name] on 11/20/2018 and left groin lymph node biopsy was recommended.  11/26/2018 patient underwent left inguinal lymph node biopsy. Pathology showed metastatic adenocarcinoma consistent with colorectal origin.  CDX 2+.  Case was discussed on tumor board.  Recommend colonoscopy for further evaluation.  Patient reports significant weight loss 30 pounds over the last year.  Chronic vaginal spotting. Change of bowel habits the past few months.  More constipated.  Family history positive for brother who has colon cancer prostate cancer.  patient has underwent colonoscopy on 12/03/2018 which reviewed a nonobstructing large mass in the rectum.  Also chronic fistula.  Mass was not circumferential.  This was biopsied with a cold forceps for histology.  Pathology came back hyperplastic polyp negative for dysplasia and malignancy. [name], patient underwent flex sigmoidoscopy on 12/06/2018 with rebiopsy of the rectal mass. This time biopsy results came back positive for invasive colorectal adenocarcinoma, moderately differentiated. Immunotherapy for nearly mismatch repair protein (MMR ) was performed.  There is no loss of MMR expression.  low probability of MSI high.   # Seen by [name] for evaluation of resectability for rectal cancer. In addition, she also had a second opinion with [facility] where her endometrial biopsy pathology was changed  to  adenocarcinoma, consistent with colorectal primary.   [name]. She has home health that has been assisting with ostomy care  Patient was also evaluated by [facility] oncology.  Recommendation is to proceed with TNT with concurrent chemoradiation followed by neoadjuvant chemotherapy followed by surgical resection. Patient prefers to have treatment done locally with [facility].   # Oncology Treatment:  02/03/2019- 03/19/2019  concurrent Xeloda and radiation.  Xeloda dose 825mg  /m2 BID - rounded to 1650mg  BID- on days of radiation. 04/09/2019, started on FOLFOX with bolus early.  Omitted.  07/16/2019 finished 8 cycles of FOLFOX. 09/17/19 APR/posterior vaginectomy/TAH/BSO/VY-flap pT4b pN0 with close vaginal margin 0.2 mm.  Uterus and ovaries negative for malignancy.   10/06/2019 Cancer Staging   Staging form: Colon and Rectum, AJCC 8th Edition - Pathologic stage from 10/06/2019: Stage IIC (ypT4b, pN0, cM0) - Signed by [name], MD on 10/06/2019   06/30/2020 Cancer Staging   Staging form: Colon and Rectum, AJCC 8th Edition - Clinical stage from 06/30/2020: [name] - Signed by [name], MD on 04/17/2022 Stage prefix: Recurrence Total positive nodes: 1   06/30/2020 Relapse/Recurrence   vaginal introitus mass biopsied. Pathology is consistent with metastatic colorectal adenocarcinoma    07/20/2020 - 09/13/2020 Chemotherapy   Concurrent chemotherapy with Xeloda with Radiation.  08/02/2020-08/16/2020, . Xeloda was held due to neutropenia    08/17/2020 - 09/06/2020 Radiation Therapy   Xeloda 1500 mg twice daily concurrently with radiation    01/31/2021 Cancer Staging   Staging form: Colon and Rectum, AJCC 8th Edition - Pathologic: Stage Unknown (rpTX, pNX, cM1) - Signed by [name], MD on 01/31/2021 Stage prefix: Recurrence   01/31/2021 - 08/20/2021 Chemotherapy   FOLFIRI + Bev  Irinotecan dose was reduced, eventually 100mg /m2  Irinotecan was dropped in November 2022 due to  side effects. Negative for UGT1A1*28    09/13/2021 -  Chemotherapy   maintenance 5-FU/bevacizumab    11/28/2021 -  Chemotherapy   5-FU/Irinotecan/bevacizumab. Irinotecan 100 mg/m2 was added back due to progressively increasing CEA.    07/14/2022 Imaging   Bone scan  1. No evidence of skeletal metastatic disease in the hips. 2. Asymmetric increased activity in the RIGHT humeral head is favored degenerative as described above. If pain in the RIGHT shoulder consider further evaluation with plain film or MRI   07/17/2022 Imaging   1. Stable scattered small bilateral pulmonary nodules, largest 8 by 6 mm in the right middle lobe. 2. Subtle chronically stable posterior cortical irregularity and heterogeneity in the left iliac bone, not appreciable on the bone scan of 07/14/2022. This has been present at least over the past year and could reflect radiation therapy related findings although strictly speaking, malignant involvement of the left iliac bone is difficult to exclude. If clinically warranted this could be further assessed with nuclear medicine PET-CT or MRI of the bony pelvis with and without contrast. 3. Pelvic floor laxity with loops of small bowel extending 3 cm below the pubococcygeal line. 4. Aortic and systemic atherosclerosis as detailed above. Aortic Atherosclerosis    12/26/2022 Imaging   CT chest abdomen pelvis w contrast 1. Stable scattered solid pulmonary nodules. 2. Right inguinal and right pelvic lymph nodes which were hypermetabolic on prior PET-CT are stable. 3. Osseous lesion of the proximal right humerus demonstrates increased sclerosis when compared with the prior PET-CT, possibly due to treatment response. 4. 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 5. Aortic Atherosclerosis     06/25/2023 Imaging   CT chest abdomen pelvis w contrast showed 1. Increased size of the solid bilateral pulmonary nodules,consistent with worsening metastatic disease. 2. Increased  size of the right external iliac and inguinal lymph nodes, concerning for worsening metastatic disease. 3. No significant interval change in the left iliac bone osseous metastasis. 4. Enlarged main pulmonary artery measuring 3.6 cm, which can be seen in the setting of pulmonary arterial hypertension. 5.  Aortic Atherosclerosis (ICD10-I70.0)     Miscellaneous   Tempus liquid biopsy showed no actionable variants.  NOTCH1 missense variant, BRCA2 missense variant , MYC missense variant   07/11/2023 Imaging   PET showed 1. Multifocal tracer avid abdominopelvic lymph nodes are identified compatible with metastatic disease. 2. Multifocal bilateral pulmonary nodules are identified compatible with pulmonary metastasis.  3. Diffuse increased radiotracer uptake throughout the marrow space is identified diminishing sensitivity for detecting osseous metastasis. No abnormal increased radiotracer uptake identified above background increased bone marrow activity to indicate metabolically active bone metastases. 4.  Aortic Atherosclerosis   07/23/2023 - 11/13/2023 Chemotherapy   switch to dose reduced FOLFOX    09/16/2023 Imaging   CT abdomen pelvis w contrast  1. Small-bowel obstruction with transition point in the anterior aspect of the lower abdomen, left of midline. This may be secondary to adhesions. 2. Small volume ascites. 3. Prior colon resection with left anterior abdominal wall colostomy. Adjacent parastomal hernia. Small volume fluid within the hernia sac. 4. Stable pulmonary metastatic disease. 5. Stable metastatic lesion of the left iliac wing. 6. Enlarged right external iliac chain lymph node measuring 1.4 cm, previously 1.3 cm. Several mildly prominent retroperitoneal lymph nodes have increased in size from prior but remain subcentimeter in size. 7. Aortic atherosclerosis (ICD10-I70.0).   09/16/2023 -  09/20/2023 Hospital Admission   Hospitalized due to Small bowel obstruction due to  adhesions    09/24/2023 Imaging   1. Bilateral pulmonary metastases are all stable from 06/25/2023 chest CT. 2. No thoracic adenopathy. No new or progressive metastatic disease in the chest. 3. Trace bilateral posterior pleural effusions. 4. Two-vessel coronary atherosclerosis. 5. Dilated main pulmonary artery, suggesting pulmonary arterial hypertension. 6.  Aortic Atherosclerosis (ICD10-I70.0).   11/28/2023 Imaging   CT chest abdomen pelvis with contrast 1. Increased size of retroperitoneal, iliac side chain and inguinal lymph nodes, concerning for progressive nodal metastatic disease. 2. Numerous bilateral solid pulmonary nodules are again seen, some of which have increased in size and others are stable in size overall compatible with disease progression. 3. Increased size of the subcapsular lesion in the anterior inferior right lobe of the liver, which has been stable over multiple prior examinations and was not FDG avid on prior PET-CT. Suggest attention on follow-up imaging. 4. 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 5. Questionable wall thickening of matted loops of small bowel in the left lower quadrant, suggest attention on follow-up imaging. 6. Small volume abdominopelvic free fluid. 7.  Aortic Atherosclerosis (ICD10-I70.0).   12/13/2023 -  Chemotherapy   Patient is on Treatment Plan : COLORECTAL Bevacizumab + Trifluridine/Tipiracil q28d     01/01/2024 Procedure   Right inguinal crease skin nodule biopsy  Pathology showed dermal deposits of metastatic colon adenocarcinoma, extending to the edge and base.      Metastatic adenocarcinoma (HCC)  12/13/2023 -  Chemotherapy   Patient is on Treatment Plan : COLORECTAL Bevacizumab + Trifluridine/Tipiracil q28d         INTERVAL HISTORY Jacqueline Yoder is a 78 y.o. female who has above history reviewed by me presents for follow-up of rectal cancer. She was accompanied by her  daughter. + manageable neuropathy symptoms of her fingertips, intermittent,  worse when exposed to cold temperature.  + chronic loose ostomy output, stable, not worse.  + Right humerous fracture, pain is constrolled. Pending PET scan  Review of Systems  Constitutional:  Positive for fatigue. Negative for appetite change, chills, fever and unexpected weight change.  HENT:   Negative for hearing loss and voice change.   Eyes:  Negative for eye problems.  Respiratory:  Negative for chest tightness and cough.   Cardiovascular:  Negative for chest pain.  Gastrointestinal:  Positive for diarrhea. Negative for abdominal distention, abdominal pain, blood in stool, constipation and nausea.  Endocrine: Negative for hot flashes.  Genitourinary:  Negative for difficulty urinating and frequency.   Musculoskeletal:  Positive for arthralgias.        right shoulder pain.   Skin:  Negative for itching.  Neurological:  Positive for numbness. Negative for extremity weakness.  Hematological:  Negative for adenopathy.  Psychiatric/Behavioral:  Negative for confusion.     MEDICAL HISTORY:  Past Medical History:  Diagnosis Date   Allergy    Arthritis    Blood clot in vein    Family history of colon cancer    GERD (gastroesophageal reflux disease)    Hypercholesteremia    Hypertension    Hypertension    Lower extremity edema    Personal history of chemotherapy    Rectal cancer (HCC) 12/2018   Urinary incontinence     SURGICAL HISTORY: Past Surgical History:  Procedure Laterality Date   ABDOMINAL HYSTERECTOMY     CHOLECYSTECTOMY  1971   COLONOSCOPY WITH PROPOFOL N/A 12/03/2018  Procedure: COLONOSCOPY WITH PROPOFOL;  Surgeon: Midge Minium, MD;  Location[facility] ENDOSCOPY;  Service: Endoscopy;  Laterality: N/A;   COLONOSCOPY WITH PROPOFOL N/A 07/15/2020   Procedure: COLONOSCOPY WITH PROPOFOL;  Surgeon: Wyline Mood, MD;  Location: [facility] ENDOSCOPY;  Service: Gastroenterology;  Laterality: N/A;    FLEXIBLE SIGMOIDOSCOPY N/A 12/06/2018   Procedure: FLEXIBLE SIGMOIDOSCOPY;  Surgeon: Wyline Mood, MD;  Location: [facility] ENDOSCOPY;  Service: Endoscopy;  Laterality: N/A;   LAPAROSCOPIC COLOSTOMY  01/06/2019   PORTACATH PLACEMENT N/A 04/03/2019   Procedure: INSERTION PORT-A-CATH;  Surgeon: Leafy Ro, MD;  Location: [facility] ORS;  Service: General;  Laterality: N/A;    SOCIAL HISTORY: Social History   Socioeconomic History   Marital status: Widowed    Spouse name: Not on file   Number of children: Not on file   Years of education: Not on file   Highest education level: Some college, no degree  Occupational History   Not on file  Tobacco Use   Smoking status: Former    Current packs/day: 0.00    Types: Cigarettes    Quit date: 12/02/1977    Years since quitting: 46.1   Smokeless tobacco: Former  Building services engineer status: Never Used  Substance and Sexual Activity   Alcohol use: Never   Drug use: Never   Sexual activity: Not Currently    Birth control/protection: None  Other Topics Concern   Not on file  Social History Narrative   Lives with daughter   Social Drivers of Health   Financial Resource Strain: Low Risk  (11/15/2023)   Overall Financial Resource Strain (CARDIA)    Difficulty of Paying Living Expenses: Not hard at all  Food Insecurity: No Food Insecurity (11/15/2023)   Hunger Vital Sign    Worried About Running Out of Food in the Last Year: Never true    Ran Out of Food in the Last Year: Never true  Transportation Needs: No Transportation Needs (11/15/2023)   PRAPARE - Administrator, Civil Service (Medical): No    Lack of Transportation (Non-Medical): No  Physical Activity: Insufficiently Active (11/15/2023)   Exercise Vital Sign    Days of Exercise per Week: 3 days    Minutes of Exercise per Session: 10 min  Stress: No Stress Concern Present (11/15/2023)   [employer] of Occupational Health - Occupational Stress Questionnaire    Feeling of  Stress : Not at all  Social Connections: Moderately Integrated (11/15/2023)   Social Connection and Isolation Panel [NHANES]    Frequency of Communication with Friends and Family: More than three times a week    Frequency of Social Gatherings with Friends and Family: More than three times a week    Attends Religious Services: More than 4 times per year    Active Member of [employer] or Organizations: Yes    Attends Banker Meetings: More than 4 times per year    Marital Status: Widowed  Intimate Partner Violence: Not At Risk (09/27/2023)   Humiliation, Afraid, Rape, and Kick questionnaire    Fear of Current or Ex-Partner: No    Emotionally Abused: No    Physically Abused: No    Sexually Abused: No    FAMILY HISTORY: Family History  Problem Relation Age of Onset   Colon cancer Brother 64       exposure to chemicals [place]   Hypertension Mother    Stroke Mother    Kidney failure Father    Breast cancer Neg Hx  Ovarian cancer Neg Hx     ALLERGIES:  is allergic to sulfamethoxazole-trimethoprim.  MEDICATIONS:  Current Outpatient Medications  Medication Sig Dispense Refill   acetaminophen (TYLENOL) 650 MG CR tablet Take 650 mg by mouth every 8 (eight) hours as needed for pain.     Cholecalciferol (VITAMIN D3) 2000 units capsule Take 2,000 Units by mouth daily.     clindamycin (CLINDAGEL) 1 % gel Apply topically 2 (two) times daily. 30 g 5   diclofenac sodium (VOLTAREN) 1 % GEL Apply 2 g topically 4 (four) times daily as needed (joint pain).  11   diphenoxylate-atropine (LOMOTIL) 2.5-0.025 MG tablet Take 1 tablet by mouth 4 (four) times daily as needed for diarrhea or loose stools. 120 tablet 0   ELIQUIS 2.5 MG TABS tablet TAKE 1 TABLET BY MOUTH TWICE  DAILY 200 tablet 2   fluticasone (FLONASE) 50 MCG/ACT nasal spray Place 1 spray into both nostrils daily as needed for allergies or rhinitis.     ketoconazole (NIZORAL) 2 % cream Apply 1 Application topically daily. For up  to 2 weeks. May repeat if need. 30 g 2   lidocaine-prilocaine (EMLA) cream Apply 1 Application topically daily as needed. Apply small amount to port and cover with saran wrap 1-2 hours prior to port access 30 g 3   loratadine (CLARITIN) 10 MG tablet Take 10 mg by mouth daily.     magnesium chloride (SLOW-MAG) 64 MG TBEC SR tablet Take 1 tablet (64 mg total) by mouth 2 (two) times daily. 60 tablet 2   Multiple Vitamins-Minerals (ONE-A-DAY WOMENS 50 PLUS PO) Take 1 tablet by mouth daily.      potassium chloride SA (KLOR-CON M) 20 MEQ tablet TAKE 1 TABLET BY MOUTH DAILY 100 tablet 2   prochlorperazine (COMPAZINE) 10 MG tablet Take 1 tablet (10 mg total) by mouth every 6 (six) hours as needed (NAUSEA). 30 tablet 1   simvastatin (ZOCOR) 40 MG tablet Take 1 tablet (40 mg total) by mouth at bedtime. 100 tablet 3   triamcinolone cream (KENALOG) 0.5 % Apply 1 Application topically 2 (two) times daily. 90 g 1   triamterene-hydrochlorothiazide (DYAZIDE) 37.5-25 MG capsule Take 1 each (1 capsule total) by mouth daily. 90 capsule 1   trifluridine-tipiracil (LONSURF) 20-8.19 MG tablet Take 3 tablets (60 mg of trifluridine total) by mouth 2 (two) times daily. 1hr after AM & PM meals days 1-5, 8-12. Repeat every 28d. 60 tablet 1   zinc gluconate 50 MG tablet Take 50 mg by mouth daily.     gentamicin cream (GARAMYCIN) 0.1 % Apply to affected toe once daily. (Patient not taking: Reported on 12/21/2023) 30 g 1   nystatin (MYCOSTATIN/NYSTOP) powder APPLY 1 POWDER TOPICALLY THREE TIMES DAILY (Patient not taking: No sig reported) 45 g 0   ondansetron (ZOFRAN) 8 MG tablet Take 1 tablet (8 mg total) by mouth every 8 (eight) hours as needed for nausea or vomiting. (Patient not taking: Reported on 01/10/2024) 30 tablet 1   tiZANidine (ZANAFLEX) 4 MG tablet Take 0.5 tablets (2 mg total) by mouth every 8 (eight) hours as needed for muscle spasms. (Patient not taking: Reported on 12/27/2023) 10 tablet 0   No current  facility-administered medications for this visit.   Facility-Administered Medications Ordered in Other Visits  Medication Dose Route Frequency Provider Last Rate Last Admin   heparin lock flush 100 UNIT/ML injection              PHYSICAL EXAMINATION: ECOG PERFORMANCE STATUS: 1 -  Symptomatic but completely ambulatory  Physical Exam Constitutional:      General: She is not in acute distress. HENT:     Head: Normocephalic and atraumatic.  Eyes:     General: No scleral icterus. Cardiovascular:     Rate and Rhythm: Normal rate.     Heart sounds: Normal heart sounds.  Pulmonary:     Effort: Pulmonary effort is normal. No respiratory distress.     Breath sounds: No wheezing.  Abdominal:     General: Bowel sounds are normal. There is no distension.     Palpations: Abdomen is soft.     Comments: + Colostomy bag   Musculoskeletal:        General: No deformity. Normal range of motion.     Cervical back: Normal range of motion and neck supple.     Comments: Right shoulder tenderness.  Skin:    General: Skin is warm and dry.  Neurological:     Mental Status: She is alert and oriented to person, place, and time. Mental status is at baseline.       LABORATORY DATA:  I have reviewed the data as listed    Latest Ref Rng & Units 01/10/2024    8:27 AM 12/27/2023    8:41 AM 12/13/2023    8:25 AM  CBC  WBC 4.0 - 10.5 K/uL 3.0  4.6  5.6   Hemoglobin 12.0 - 15.0 g/dL 52.8  41.3  24.4   Hematocrit 36.0 - 46.0 % 33.1  32.4  34.5   Platelets 150 - 400 K/uL 252  183  230       Latest Ref Rng & Units 01/10/2024    8:27 AM 12/27/2023    8:41 AM 12/13/2023    8:25 AM  CMP  Glucose 70 - 99 mg/dL 010  272  536   BUN 8 - 23 mg/dL 17  13  16    Creatinine 0.44 - 1.00 mg/dL 6.44  0.34  7.42   Sodium 135 - 145 mmol/L 138  137  138   Potassium 3.5 - 5.1 mmol/L 3.9  3.6  3.9   Chloride 98 - 111 mmol/L 100  104  101   CO2 22 - 32 mmol/L 27  27  28    Calcium 8.9 - 10.3 mg/dL 9.3  9.1  9.5    Total Protein 6.5 - 8.1 g/dL 7.4  6.6  7.2   Total Bilirubin 0.0 - 1.2 mg/dL 0.6  0.6  0.5   Alkaline Phos 38 - 126 U/L 86  77  75   AST 15 - 41 U/L 33  32  50   ALT 0 - 44 U/L 18  18  27       RADIOGRAPHIC STUDIES: I have personally reviewed the radiological images as listed and agreed with the findings in the report. DG Shoulder Right Result Date: 12/27/2023 CLINICAL DATA:  Chronic right shoulder pain without known injury. EXAM: RIGHT SHOULDER - 2+ VIEW COMPARISON:  None Available. FINDINGS: Possible nondisplaced fracture involving greater tuberosity. No dislocation is noted. Moderate degenerative changes are seen involving the right glenohumeral and acromioclavicular joints. Possible Hill-Sachs deformity suggesting prior dislocations. IMPRESSION: Possible nondisplaced fracture involving greater tuberosity of proximal humerus. Degenerative changes as noted above. Electronically Signed   By: Lupita Raider M.D.   On: 12/27/2023 09:51   VAS [place] LOWER EXTREMITY VENOUS REFLUX Result Date: 12/21/2023  Lower Venous Reflux Study Patient Name:  VANDANA HAMAN  Date of Exam:  12/20/2023 Medical Rec #: [id number]       Accession #:    [id number] Date of Birth: 1946/07/27       Patient Gender: F Patient Age:   7 years Exam [facility] Procedure:      VAS [place] LOWER EXTREMITY VENOUS REFLUX Referring Phys: Sheppard Plumber --------------------------------------------------------------------------------  Indications: Pain, Swelling, Edema, and rt knee.  Performing Technologist: Salvadore Farber RVT  Examination Guidelines: A complete evaluation includes B-mode imaging, spectral Doppler, color Doppler, and power Doppler as needed of all accessible portions of each vessel. Bilateral testing is considered an integral part of a complete examination. Limited examinations for reoccurring indications may be performed as noted. The reflux portion of the exam is performed with the patient in reverse  Trendelenburg. Significant venous reflux is defined as >500 ms in the superficial venous system, and >1 second in the deep venous system.   Summary: Right: - No evidence of deep vein thrombosis seen in the right lower extremity, from the common femoral through the popliteal veins. - No evidence of superficial venous thrombosis in the right lower extremity. - There is no evidence of venous reflux seen in the right lower extremity. - No evidence of superficial venous reflux seen in the right greater saphenous vein. - No evidence of superficial venous reflux seen in the right short saphenous vein.  *See table(s) above for measurements and observations. Electronically signed by Levora Dredge MD on 12/21/2023 at 8:33:40 AM.    Final    CT CHEST ABDOMEN PELVIS W CONTRAST Result Date: 11/28/2023 CLINICAL DATA:  Rectal cancer, follow-up.  * Tracking Code: BO * EXAM: CT CHEST, ABDOMEN, AND PELVIS WITH CONTRAST TECHNIQUE: Multidetector CT imaging of the chest, abdomen and pelvis was performed following the standard protocol during bolus administration of intravenous contrast. RADIATION DOSE REDUCTION: This exam was performed according to the departmental dose-optimization program which includes automated exposure control, adjustment of the mA and/or kV according to patient size and/or use of iterative reconstruction technique. CONTRAST:  85mL OMNIPAQUE IOHEXOL 300 MG/ML  SOLN COMPARISON:  Multiple priors including CT September 24, 2023 FINDINGS: CT CHEST FINDINGS Cardiovascular: Right chest Port-A-Cath with tip in the right atrium. Thoracic aortic atherosclerosis. Dilated main pulmonary artery. Coronary artery calcifications. Normal size heart. Mediastinum/Nodes: Prominent supraclavicular lymph nodes measure 9 mm on image 6/2 previously 7 mm. No pathologically enlarged mediastinal, hilar or axillary lymph nodes. No suspicious thyroid nodule.  Tiny hiatal hernia. Lungs/Pleura: Numerous bilateral solid pulmonary nodules are  again seen. Some of which have increased in size and others are stable in size. For reference:: -medial right middle lobe pulmonary nodule measures 18 x 16 mm on image 95/3 previously 16 x 14 mm. -anterior left lower lobe pulmonary nodule measures 7 mm on image 111/3, unchanged. -left upper lobe pulmonary nodule measures 9 mm on image 65/3 previously 7 mm. Musculoskeletal: No aggressive lytic or blastic lesion of bone. Multilevel degenerative change of the spine. CT ABDOMEN PELVIS FINDINGS Hepatobiliary: Increased size of the subcapsular lesion in the anterior inferior right lobe of the liver now measuring 10 mm on image 69/2 previously 8 mm. Gallbladder is surgically absent. No biliary ductal dilation. Pancreas: No pancreatic ductal dilation or evidence of acute inflammation. Spleen: No splenomegaly.  Stable splenic lymphangioma/cysts. Adrenals/Urinary Tract: No suspicious adrenal nodule. Stable bilateral renal cysts. No hydronephrosis. Urinary bladder is unremarkable for degree of distension. Stomach/Bowel: Radiopaque enteric contrast material traverses the transverse colon. Stomach is unremarkable for degree of distension. No  pathologic dilation of small or large bowel. Questionable wall thickening of matted loops of small bowel in the left lower quadrant on image 89/2. Prior partial colectomy and rectal resection with left anterior abdominal wall colostomy. Fluid and soft tissue stranding in the mesorectal/presacral space without discrete enhancing soft tissue nodularity. Vascular/Lymphatic: Aortic atherosclerosis. Increased size of retroperitoneal, iliac side chain and inguinal lymph nodes. For reference: Left periaortic lymph node measures 12 mm in short axis on image 66/2 previously 9 mm. -right external iliac lymph node measuring 18 mm in short axis on image 98/2 previously measured 14 mm -Right pelvic sidewall lymph node measures 10 mm in short axis on image 93/2 previously 7 mm. Reproductive: Uterus is  surgically absent. Other: Small volume abdominopelvic free fluid. Postsurgical change in the abdominal wall. Musculoskeletal: Similar appearance of the permeative lytic metastatic lesion in the left iliac wing. No new aggressive lytic or blastic lesion of bone. Multilevel degenerative change of the spine. Demineralization of bone. IMPRESSION: 1. Increased size of retroperitoneal, iliac side chain and inguinal lymph nodes, concerning for progressive nodal metastatic disease. 2. Numerous bilateral solid pulmonary nodules are again seen, some of which have increased in size and others are stable in size overall compatible with disease progression. 3. Increased size of the subcapsular lesion in the anterior inferior right lobe of the liver, which has been stable over multiple prior examinations and was not FDG avid on prior PET-CT. Suggest attention on follow-up imaging. 4. 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 5. Questionable wall thickening of matted loops of small bowel in the left lower quadrant, suggest attention on follow-up imaging. 6. Small volume abdominopelvic free fluid. 7.  Aortic Atherosclerosis (ICD10-I70.0). Electronically Signed   By: Maudry Mayhew M.D.   On: 11/28/2023 15:20   DG Bone Density Result Date: 11/19/2023 EXAM: DUAL X-RAY ABSORPTIOMETRY (DXA) FOR BONE MINERAL DENSITY IMPRESSION: Your patient Isidora Laham completed a BMD test on 11/19/2023 using the [employer] DXA System (software version: 14.10) manufactured by [employer]. The following summarizes the results of our evaluation. Technologist:VLM PATIENT BIOGRAPHICAL: Name: Mahli, Glahn Patient ID: [id number] Birth Date: 1946/03/25 Height: 64.0 in. Gender: Female Exam Date: 11/19/2023 Weight: 171.2 lbs. Indications: Chemo, Colon CA, Hysterectomy Fractures: Treatments: Vitamin D DENSITOMETRY RESULTS: Site      Region        Measured Date Measured Age WHO Classification Young Adult  T-score BMD         %Change vs. Previous Significant Change (*) AP Spine L1-L4 (L2,L3) 11/19/2023 76.6 Normal 0.2 1.203 g/cm2 - - DualFemur Total Right 11/19/2023 76.6 Normal -0.5 0.946 g/cm2 - - ASSESSMENT: The BMD measured at Femur Total Right is 0.946 g/cm2 with a T-score of -0.5. This patient is considered normal according to World Health Organization [facility]) criteria. L-2 & L-3 were excluded due to degenerative changes. The scan quality is good. World Science writer [facility]) criteria for post-menopausal, Caucasian Women: Normal:                   T-score at or above -1 SD Osteopenia/low bone mass: T-score between -1 and -2.5 SD Osteoporosis:             T-score at or below -2.5 SD RECOMMENDATIONS: 1. All patients should optimize calcium and vitamin D intake. 2. Consider FDA-approved medical therapies in postmenopausal women and men aged 21 years and older, based on the following: a. A hip or vertebral(clinical or morphometric) fracture  b. T-score < -2.5 at the femoral neck or spine after appropriate evaluation to exclude secondary causes c. Low bone mass (T-score between -1.0 and -2.5 at the femoral neck or spine) and a 10-year probability of a hip fracture > 3% or a 10-year probability of a major osteoporosis-related fracture > 20% based on the [place]-adapted WHO algorithm 3. Clinician judgment and/or patient preferences may indicate treatment for people with 10-year fracture probabilities above or below these levels FOLLOW-UP: People with diagnosed cases of osteoporosis or at high risk for fracture should have regular bone mineral density tests. For patients eligible for Medicare, routine testing is allowed once every 2 years. The testing frequency can be increased to one year for patients who have rapidly progressing disease, those who are receiving or discontinuing medical therapy to restore bone mass, or have additional risk factors. I have reviewed this report, and agree with the above findings. [facility] Electronically Signed   By: Frederico Hamman M.D.   On: 11/19/2023 10:00      DG Shoulder Right Result Date: 12/27/2023 CLINICAL DATA:  Chronic right shoulder pain without known injury. EXAM: RIGHT SHOULDER - 2+ VIEW COMPARISON:  None Available. FINDINGS: Possible nondisplaced fracture involving greater tuberosity. No dislocation is noted. Moderate degenerative changes are seen involving the right glenohumeral and acromioclavicular joints. Possible Hill-Sachs deformity suggesting prior dislocations. IMPRESSION: Possible nondisplaced fracture involving greater tuberosity of proximal humerus. Degenerative changes as noted above. Electronically Signed   By: Lupita Raider M.D.   On: 12/27/2023 09:51   VAS [place] LOWER EXTREMITY VENOUS REFLUX Result Date: 12/21/2023  Lower Venous Reflux Study Patient Name:  Jacqueline Yoder  Date of Exam:   12/20/2023 Medical Rec #: [id number]       Accession #:    [id number] Date of Birth: Jul 02, 1946       Patient Gender: F Patient Age:   89 years Exam Location:  [facility] Procedure:      VAS [place] LOWER EXTREMITY VENOUS REFLUX Referring Phys: Sheppard Plumber --------------------------------------------------------------------------------  Indications: Pain, Swelling, Edema, and rt knee.  Performing Technologist: Salvadore Farber RVT  Examination Guidelines: A complete evaluation includes B-mode imaging, spectral Doppler, color Doppler, and power Doppler as needed of all accessible portions of each vessel. Bilateral testing is considered an integral part of a complete examination. Limited examinations for reoccurring indications may be performed as noted. The reflux portion of the exam is performed with the patient in reverse Trendelenburg. Significant venous reflux is defined as >500 ms in the superficial venous system, and >1 second in the deep venous system.   Summary: Right: - No evidence of deep vein thrombosis seen in the right lower extremity, from the  common femoral through the popliteal veins. - No evidence of superficial venous thrombosis in the right lower extremity. - There is no evidence of venous reflux seen in the right lower extremity. - No evidence of superficial venous reflux seen in the right greater saphenous vein. - No evidence of superficial venous reflux seen in the right short saphenous vein.  *See table(s) above for measurements and observations. Electronically signed by Levora Dredge MD on 12/21/2023 at 8:33:40 AM.    Final    CT CHEST ABDOMEN PELVIS W CONTRAST Result Date: 11/28/2023 CLINICAL DATA:  Rectal cancer, follow-up.  * Tracking Code: BO * EXAM: CT CHEST, ABDOMEN, AND PELVIS WITH CONTRAST TECHNIQUE: Multidetector CT imaging of the chest, abdomen and pelvis was performed following the standard protocol during bolus  administration of intravenous contrast. RADIATION DOSE REDUCTION: This exam was performed according to the departmental dose-optimization program which includes automated exposure control, adjustment of the mA and/or kV according to patient size and/or use of iterative reconstruction technique. CONTRAST:  85mL OMNIPAQUE IOHEXOL 300 MG/ML  SOLN COMPARISON:  Multiple priors including CT September 24, 2023 FINDINGS: CT CHEST FINDINGS Cardiovascular: Right chest Port-A-Cath with tip in the right atrium. Thoracic aortic atherosclerosis. Dilated main pulmonary artery. Coronary artery calcifications. Normal size heart. Mediastinum/Nodes: Prominent supraclavicular lymph nodes measure 9 mm on image 6/2 previously 7 mm. No pathologically enlarged mediastinal, hilar or axillary lymph nodes. No suspicious thyroid nodule.  Tiny hiatal hernia. Lungs/Pleura: Numerous bilateral solid pulmonary nodules are again seen. Some of which have increased in size and others are stable in size. For reference:: -medial right middle lobe pulmonary nodule measures 18 x 16 mm on image 95/3 previously 16 x 14 mm. -anterior left lower lobe pulmonary nodule  measures 7 mm on image 111/3, unchanged. -left upper lobe pulmonary nodule measures 9 mm on image 65/3 previously 7 mm. Musculoskeletal: No aggressive lytic or blastic lesion of bone. Multilevel degenerative change of the spine. CT ABDOMEN PELVIS FINDINGS Hepatobiliary: Increased size of the subcapsular lesion in the anterior inferior right lobe of the liver now measuring 10 mm on image 69/2 previously 8 mm. Gallbladder is surgically absent. No biliary ductal dilation. Pancreas: No pancreatic ductal dilation or evidence of acute inflammation. Spleen: No splenomegaly.  Stable splenic lymphangioma/cysts. Adrenals/Urinary Tract: No suspicious adrenal nodule. Stable bilateral renal cysts. No hydronephrosis. Urinary bladder is unremarkable for degree of distension. Stomach/Bowel: Radiopaque enteric contrast material traverses the transverse colon. Stomach is unremarkable for degree of distension. No pathologic dilation of small or large bowel. Questionable wall thickening of matted loops of small bowel in the left lower quadrant on image 89/2. Prior partial colectomy and rectal resection with left anterior abdominal wall colostomy. Fluid and soft tissue stranding in the mesorectal/presacral space without discrete enhancing soft tissue nodularity. Vascular/Lymphatic: Aortic atherosclerosis. Increased size of retroperitoneal, iliac side chain and inguinal lymph nodes. For reference: Left periaortic lymph node measures 12 mm in short axis on image 66/2 previously 9 mm. -right external iliac lymph node measuring 18 mm in short axis on image 98/2 previously measured 14 mm -Right pelvic sidewall lymph node measures 10 mm in short axis on image 93/2 previously 7 mm. Reproductive: Uterus is surgically absent. Other: Small volume abdominopelvic free fluid. Postsurgical change in the abdominal wall. Musculoskeletal: Similar appearance of the permeative lytic metastatic lesion in the left iliac wing. No new aggressive lytic or  blastic lesion of bone. Multilevel degenerative change of the spine. Demineralization of bone. IMPRESSION: 1. Increased size of retroperitoneal, iliac side chain and inguinal lymph nodes, concerning for progressive nodal metastatic disease. 2. Numerous bilateral solid pulmonary nodules are again seen, some of which have increased in size and others are stable in size overall compatible with disease progression. 3. Increased size of the subcapsular lesion in the anterior inferior right lobe of the liver, which has been stable over multiple prior examinations and was not FDG avid on prior PET-CT. Suggest attention on follow-up imaging. 4. 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 5. Questionable wall thickening of matted loops of small bowel in the left lower quadrant, suggest attention on follow-up imaging. 6. Small volume abdominopelvic free fluid. 7.  Aortic Atherosclerosis (ICD10-I70.0). Electronically Signed   By: Maudry Mayhew M.D.   On:  11/28/2023 15:20   DG Bone Density Result Date: 11/19/2023 EXAM: DUAL X-RAY ABSORPTIOMETRY (DXA) FOR BONE MINERAL DENSITY IMPRESSION: Your patient Lorianna Spadaccini completed a BMD test on 11/19/2023 using the [employer] DXA System (software version: 14.10) manufactured by [employer]. The following summarizes the results of our evaluation. Technologist:VLM PATIENT BIOGRAPHICAL: Name: Rukiya, Hodgkins Patient ID: [id number] Birth Date: 07-21-1946 Height: 64.0 in. Gender: Female Exam Date: 11/19/2023 Weight: 171.2 lbs. Indications: Chemo, Colon CA, Hysterectomy Fractures: Treatments: Vitamin D DENSITOMETRY RESULTS: Site      Region        Measured Date Measured Age WHO Classification Young Adult T-score BMD         %Change vs. Previous Significant Change (*) AP Spine L1-L4 (L2,L3) 11/19/2023 76.6 Normal 0.2 1.203 g/cm2 - - DualFemur Total Right 11/19/2023 76.6 Normal -0.5 0.946 g/cm2 - - ASSESSMENT: The BMD measured at Femur  Total Right is 0.946 g/cm2 with a T-score of -0.5. This patient is considered normal according to World Health Organization [facility]) criteria. L-2 & L-3 were excluded due to degenerative changes. The scan quality is good. World Science writer [facility]) criteria for post-menopausal, Caucasian Women: Normal:                   T-score at or above -1 SD Osteopenia/low bone mass: T-score between -1 and -2.5 SD Osteoporosis:             T-score at or below -2.5 SD RECOMMENDATIONS: 1. All patients should optimize calcium and vitamin D intake. 2. Consider FDA-approved medical therapies in postmenopausal women and men aged 75 years and older, based on the following: a. A hip or vertebral(clinical or morphometric) fracture b. T-score < -2.5 at the femoral neck or spine after appropriate evaluation to exclude secondary causes c. Low bone mass (T-score between -1.0 and -2.5 at the femoral neck or spine) and a 10-year probability of a hip fracture > 3% or a 10-year probability of a major osteoporosis-related fracture > 20% based on the [place]-adapted WHO algorithm 3. Clinician judgment and/or patient preferences may indicate treatment for people with 10-year fracture probabilities above or below these levels FOLLOW-UP: People with diagnosed cases of osteoporosis or at high risk for fracture should have regular bone mineral density tests. For patients eligible for Medicare, routine testing is allowed once every 2 years. The testing frequency can be increased to one year for patients who have rapidly progressing disease, those who are receiving or discontinuing medical therapy to restore bone mass, or have additional risk factors. I have reviewed this report, and agree with the above findings. [facility] Electronically Signed   By: Frederico Hamman M.D.   On: 11/19/2023 10:00

## 2024-01-10 NOTE — Assessment & Plan Note (Signed)
 Stable symptoms, Grade 1-2.patient copes well.

## 2024-01-10 NOTE — Assessment & Plan Note (Signed)
 continue lomotil  QID PRN as instructed.  She has not needed to take antidiarrhea.

## 2024-01-10 NOTE — Assessment & Plan Note (Signed)
 Hb stable, observation

## 2024-01-11 DIAGNOSIS — Z933 Colostomy status: Secondary | ICD-10-CM | POA: Diagnosis not present

## 2024-01-11 LAB — CEA: CEA: 10193 ng/mL — ABNORMAL HIGH (ref 0.0–4.7)

## 2024-01-14 ENCOUNTER — Ambulatory Visit
Admission: RE | Admit: 2024-01-14 | Discharge: 2024-01-14 | Disposition: A | Discharge status: Home / Self Care | Source: Ambulatory Visit | Attending: Radiation Oncology | Admitting: Radiation Oncology

## 2024-01-14 DIAGNOSIS — K769 Liver disease, unspecified: Secondary | ICD-10-CM | POA: Insufficient documentation

## 2024-01-14 DIAGNOSIS — R918 Other nonspecific abnormal finding of lung field: Secondary | ICD-10-CM | POA: Diagnosis not present

## 2024-01-14 DIAGNOSIS — R590 Localized enlarged lymph nodes: Secondary | ICD-10-CM | POA: Diagnosis not present

## 2024-01-14 DIAGNOSIS — C7951 Secondary malignant neoplasm of bone: Secondary | ICD-10-CM | POA: Diagnosis not present

## 2024-01-14 DIAGNOSIS — I700 Atherosclerosis of aorta: Secondary | ICD-10-CM | POA: Diagnosis not present

## 2024-01-14 DIAGNOSIS — K5730 Diverticulosis of large intestine without perforation or abscess without bleeding: Secondary | ICD-10-CM | POA: Diagnosis not present

## 2024-01-14 DIAGNOSIS — Z933 Colostomy status: Secondary | ICD-10-CM | POA: Insufficient documentation

## 2024-01-14 DIAGNOSIS — C20 Malignant neoplasm of rectum: Secondary | ICD-10-CM | POA: Diagnosis not present

## 2024-01-14 LAB — GLUCOSE, CAPILLARY: Glucose-Capillary: 81 mg/dL (ref 70–99)

## 2024-01-14 MED ORDER — FLUDEOXYGLUCOSE F - 18 (FDG) INJECTION
8.5200 | Freq: Once | INTRAVENOUS | Status: AC | PRN
  Administered 2024-01-14: 8.52 via INTRAVENOUS

## 2024-01-15 ENCOUNTER — Encounter (INDEPENDENT_AMBULATORY_CARE_PROVIDER_SITE_OTHER): Payer: Self-pay | Admitting: Nurse Practitioner

## 2024-01-15 ENCOUNTER — Ambulatory Visit (INDEPENDENT_AMBULATORY_CARE_PROVIDER_SITE_OTHER): Admitting: Nurse Practitioner

## 2024-01-15 VITALS — BP 122/77 | HR 90 | Resp 16 | Wt 157.2 lb

## 2024-01-15 DIAGNOSIS — I87009 Postthrombotic syndrome without complications of unspecified extremity: Secondary | ICD-10-CM | POA: Diagnosis not present

## 2024-01-15 DIAGNOSIS — I129 Hypertensive chronic kidney disease with stage 1 through stage 4 chronic kidney disease, or unspecified chronic kidney disease: Secondary | ICD-10-CM

## 2024-01-15 DIAGNOSIS — I890 Lymphedema, not elsewhere classified: Secondary | ICD-10-CM

## 2024-01-15 DIAGNOSIS — N182 Chronic kidney disease, stage 2 (mild): Secondary | ICD-10-CM | POA: Diagnosis not present

## 2024-01-15 NOTE — Progress Notes (Signed)
 Subjective:    Patient ID: Jacqueline Yoder, female    DOB: [birth year], 78 y.o.   MRN: [id number] Chief Complaint  Patient presents with   Follow-up    Pt conv rle reflux follow up    The patient is a 78 year old female who presents today for swelling in her right lower extremity.  She has a history of rectal cancer with lung metastasis.  She had a significant right lower extremity DVT several years ago.  Since that time she has been swelling.  She notes that recently the swelling has been worse.  She remains on Eliquis 2.5 mg twice daily.  She also utilizes medical grade compression and elevation.  Today her swelling is much improved with use of her medical therapy.  She was also fitted for a lymphedema pump and should be receiving this soon.  Currently she has no evidence of open wounds or cellulitis.  Today noninvasive studies show no evidence of DVT or superficial thrombophlebitis bilaterally.  No evidence of deep venous insufficiency or superficial venous reflux on the right lower extremity    Review of Systems  Cardiovascular:  Positive for leg swelling.  All other systems reviewed and are negative.      Objective:   Physical Exam Vitals reviewed.  HENT:     Head: Normocephalic.  Cardiovascular:     Rate and Rhythm: Normal rate.  Pulmonary:     Effort: Pulmonary effort is normal.  Musculoskeletal:     Right lower leg: Edema present.  Skin:    General: Skin is warm and dry.  Neurological:     Mental Status: She is alert and oriented to person, place, and time.  Psychiatric:        Mood and Affect: Mood normal.        Behavior: Behavior normal.        Thought Content: Thought content normal.        Judgment: Judgment normal.     BP 122/77   Pulse 90   Resp 16   Wt 157 lb 3.2 oz (71.3 kg)   BMI 26.98 kg/m   Past Medical History:  Diagnosis Date   Allergy    Arthritis    Blood clot in vein    Family history of colon cancer    GERD (gastroesophageal reflux  disease)    Hypercholesteremia    Hypertension    Hypertension    Lower extremity edema    Personal history of chemotherapy    Rectal cancer (HCC) 12/2018   Urinary incontinence     Social History   Socioeconomic History   Marital status: Widowed    Spouse name: Not on file   Number of children: Not on file   Years of education: Not on file   Highest education level: Some college, no degree  Occupational History   Not on file  Tobacco Use   Smoking status: Former    Current packs/day: 0.00    Types: Cigarettes    Quit date: 12/02/1977    Years since quitting: 46.1   Smokeless tobacco: Former  Building services engineer status: Never Used  Substance and Sexual Activity   Alcohol use: Never   Drug use: Never   Sexual activity: Not Currently    Birth control/protection: None  Other Topics Concern   Not on file  Social History Narrative   Lives with daughter   Social Drivers of Health   Financial Resource Strain: Low Risk  (11/15/2023)  Overall Financial Resource Strain (CARDIA)    Difficulty of Paying Living Expenses: Not hard at all  Food Insecurity: No Food Insecurity (11/15/2023)   Hunger Vital Sign    Worried About Running Out of Food in the Last Year: Never true    Ran Out of Food in the Last Year: Never true  Transportation Needs: No Transportation Needs (11/15/2023)   PRAPARE - Administrator, Civil Service (Medical): No    Lack of Transportation (Non-Medical): No  Physical Activity: Insufficiently Active (11/15/2023)   Exercise Vital Sign    Days of Exercise per Week: 3 days    Minutes of Exercise per Session: 10 min  Stress: No Stress Concern Present (11/15/2023)   [employer] of Occupational Health - Occupational Stress Questionnaire    Feeling of Stress : Not at all  Social Connections: Moderately Integrated (11/15/2023)   Social Connection and Isolation Panel [NHANES]    Frequency of Communication with Friends and Family: More than three times  a week    Frequency of Social Gatherings with Friends and Family: More than three times a week    Attends Religious Services: More than 4 times per year    Active Member of [employer] or Organizations: Yes    Attends Banker Meetings: More than 4 times per year    Marital Status: Widowed  Intimate Partner Violence: Not At Risk (09/27/2023)   Humiliation, Afraid, Rape, and Kick questionnaire    Fear of Current or Ex-Partner: No    Emotionally Abused: No    Physically Abused: No    Sexually Abused: No    Past Surgical History:  Procedure Laterality Date   ABDOMINAL HYSTERECTOMY     CHOLECYSTECTOMY  1971   COLONOSCOPY WITH PROPOFOL N/A 12/03/2018   Procedure: COLONOSCOPY WITH PROPOFOL;  Surgeon: Midge Minium, MD;  Location: [facility] ENDOSCOPY;  Service: Endoscopy;  Laterality: N/A;   COLONOSCOPY WITH PROPOFOL N/A 07/15/2020   Procedure: COLONOSCOPY WITH PROPOFOL;  Surgeon: Wyline Mood, MD;  Location: [facility] ENDOSCOPY;  Service: Gastroenterology;  Laterality: N/A;   FLEXIBLE SIGMOIDOSCOPY N/A 12/06/2018   Procedure: FLEXIBLE SIGMOIDOSCOPY;  Surgeon: Wyline Mood, MD;  Location: [facility] ENDOSCOPY;  Service: Endoscopy;  Laterality: N/A;   LAPAROSCOPIC COLOSTOMY  01/06/2019   PORTACATH PLACEMENT N/A 04/03/2019   Procedure: INSERTION PORT-A-CATH;  Surgeon: Leafy Ro, MD;  Location: [facility] ORS;  Service: General;  Laterality: N/A;    Family History  Problem Relation Age of Onset   Colon cancer Brother 69       exposure to chemicals [place]   Hypertension Mother    Stroke Mother    Kidney failure Father    Breast cancer Neg Hx    Ovarian cancer Neg Hx     Allergies  Allergen Reactions   Sulfamethoxazole-Trimethoprim Other (See Comments)    Also said she had chills and pressure in nose.       Latest Ref Rng & Units 01/10/2024    8:27 AM 12/27/2023    8:41 AM 12/13/2023    8:25 AM  CBC  WBC 4.0 - 10.5 K/uL 3.0  4.6  5.6   Hemoglobin 12.0 - 15.0 g/dL 16.1  09.6  04.5   Hematocrit  36.0 - 46.0 % 33.1  32.4  34.5   Platelets 150 - 400 K/uL 252  183  230       CMP     Component Value Date/Time   NA 138 01/10/2024 0827   K 3.9 01/10/2024 0827  CL 100 01/10/2024 0827   CO2 27 01/10/2024 0827   GLUCOSE 115 (H) 01/10/2024 0827   BUN 17 01/10/2024 0827   CREATININE 0.94 01/10/2024 0827   CREATININE 1.10 (H) 06/24/2021 0802   CALCIUM 9.3 01/10/2024 0827   PROT 7.4 01/10/2024 0827   ALBUMIN 3.4 (L) 01/10/2024 0827   AST 33 01/10/2024 0827   ALT 18 01/10/2024 0827   ALKPHOS 86 01/10/2024 0827   BILITOT 0.6 01/10/2024 0827   EGFR 53 (L) 06/24/2021 0802   GFRNONAA >60 01/10/2024 0827     No results found.     Assessment & Plan:   1. Lymphedema (Primary) The patient will continue with medical grade compression as she has been doing.  We discussed that when she is able to receive her lymphedema pumps she should utilize this at least daily for an hour or so.  She does have noted enlarged lymph nodes which I do believe is also contributing to her worsening swelling in addition to the postphlebitic symptoms.  She will continue with conservative therapy will have the patient return in 6 months.   2. Postthrombotic syndrome of lower extremity I suspect that the driving cause behind the swelling is postphlebitic syndrome.  The DVT has resolved but she still has substantial swelling in the right lower extremity.    3. Benign hypertension with CKD (chronic kidney disease), stage II Continue antihypertensive medications as already ordered, these medications have been reviewed and there are no changes at this time.   Current Outpatient Medications on File Prior to Visit  Medication Sig Dispense Refill   acetaminophen (TYLENOL) 650 MG CR tablet Take 650 mg by mouth every 8 (eight) hours as needed for pain.     Cholecalciferol (VITAMIN D3) 2000 units capsule Take 2,000 Units by mouth daily.     clindamycin (CLINDAGEL) 1 % gel Apply topically 2 (two) times daily. 30 g 5    diclofenac sodium (VOLTAREN) 1 % GEL Apply 2 g topically 4 (four) times daily as needed (joint pain).  11   diphenoxylate-atropine (LOMOTIL) 2.5-0.025 MG tablet Take 1 tablet by mouth 4 (four) times daily as needed for diarrhea or loose stools. 120 tablet 0   ELIQUIS 2.5 MG TABS tablet TAKE 1 TABLET BY MOUTH TWICE  DAILY 200 tablet 2   fluticasone (FLONASE) 50 MCG/ACT nasal spray Place 1 spray into both nostrils daily as needed for allergies or rhinitis.     ketoconazole (NIZORAL) 2 % cream Apply 1 Application topically daily. For up to 2 weeks. May repeat if need. 30 g 2   lidocaine-prilocaine (EMLA) cream Apply 1 Application topically daily as needed. Apply small amount to port and cover with saran wrap 1-2 hours prior to port access 30 g 3   loratadine (CLARITIN) 10 MG tablet Take 10 mg by mouth daily.     magnesium chloride (SLOW-MAG) 64 MG TBEC SR tablet Take 1 tablet (64 mg total) by mouth 2 (two) times daily. 60 tablet 2   Multiple Vitamins-Minerals (ONE-A-DAY WOMENS 50 PLUS PO) Take 1 tablet by mouth daily.      potassium chloride SA (KLOR-CON M) 20 MEQ tablet TAKE 1 TABLET BY MOUTH DAILY 100 tablet 2   prochlorperazine (COMPAZINE) 10 MG tablet Take 1 tablet (10 mg total) by mouth every 6 (six) hours as needed (NAUSEA). 30 tablet 1   simvastatin (ZOCOR) 40 MG tablet Take 1 tablet (40 mg total) by mouth at bedtime. 100 tablet 3   triamcinolone cream (KENALOG) 0.5 %  Apply 1 Application topically 2 (two) times daily. 90 g 1   triamterene-hydrochlorothiazide (DYAZIDE) 37.5-25 MG capsule Take 1 each (1 capsule total) by mouth daily. 90 capsule 1   trifluridine-tipiracil (LONSURF) 20-8.19 MG tablet Take 3 tablets (60 mg of trifluridine total) by mouth 2 (two) times daily. 1hr after AM & PM meals days 1-5, 8-12. Repeat every 28d. 60 tablet 1   zinc gluconate 50 MG tablet Take 50 mg by mouth daily.     gentamicin cream (GARAMYCIN) 0.1 % Apply to affected toe once daily. (Patient not taking:  Reported on 12/21/2023) 30 g 1   nystatin (MYCOSTATIN/NYSTOP) powder APPLY 1 POWDER TOPICALLY THREE TIMES DAILY (Patient not taking: No sig reported) 45 g 0   ondansetron (ZOFRAN) 8 MG tablet Take 1 tablet (8 mg total) by mouth every 8 (eight) hours as needed for nausea or vomiting. (Patient not taking: Reported on 12/21/2023) 30 tablet 1   tiZANidine (ZANAFLEX) 4 MG tablet Take 0.5 tablets (2 mg total) by mouth every 8 (eight) hours as needed for muscle spasms. (Patient not taking: Reported on 01/15/2024) 10 tablet 0   Current Facility-Administered Medications on File Prior to Visit  Medication Dose Route Frequency Provider Last Rate Last Admin   heparin lock flush 100 UNIT/ML injection             There are no Patient Instructions on file for this visit. No follow-ups on file.   Georgiana Spinner, NP

## 2024-01-17 ENCOUNTER — Encounter: Payer: Self-pay | Admitting: Radiation Oncology

## 2024-01-17 ENCOUNTER — Ambulatory Visit
Admission: RE | Admit: 2024-01-17 | Discharge: 2024-01-17 | Disposition: A | Discharge status: Home / Self Care | Source: Ambulatory Visit | Attending: Radiation Oncology | Admitting: Radiation Oncology

## 2024-01-17 ENCOUNTER — Inpatient Hospital Stay (HOSPITAL_BASED_OUTPATIENT_CLINIC_OR_DEPARTMENT_OTHER): Admitting: Oncology

## 2024-01-17 ENCOUNTER — Encounter: Payer: Self-pay | Admitting: Oncology

## 2024-01-17 ENCOUNTER — Inpatient Hospital Stay

## 2024-01-17 VITALS — BP 151/78 | HR 78 | Temp 96.3°F | Resp 18

## 2024-01-17 VITALS — BP 132/73 | HR 100 | Temp 96.8°F | Resp 18 | Wt 157.5 lb

## 2024-01-17 VITALS — BP 132/73 | HR 100 | Temp 96.8°F | Resp 24 | Wt 158.0 lb

## 2024-01-17 DIAGNOSIS — Z558 Other problems related to education and literacy: Secondary | ICD-10-CM | POA: Diagnosis not present

## 2024-01-17 DIAGNOSIS — C792 Secondary malignant neoplasm of skin: Secondary | ICD-10-CM | POA: Insufficient documentation

## 2024-01-17 DIAGNOSIS — D6481 Anemia due to antineoplastic chemotherapy: Secondary | ICD-10-CM | POA: Insufficient documentation

## 2024-01-17 DIAGNOSIS — C7951 Secondary malignant neoplasm of bone: Secondary | ICD-10-CM | POA: Insufficient documentation

## 2024-01-17 DIAGNOSIS — Z79899 Other long term (current) drug therapy: Secondary | ICD-10-CM | POA: Insufficient documentation

## 2024-01-17 DIAGNOSIS — I10 Essential (primary) hypertension: Secondary | ICD-10-CM | POA: Insufficient documentation

## 2024-01-17 DIAGNOSIS — Z933 Colostomy status: Secondary | ICD-10-CM | POA: Insufficient documentation

## 2024-01-17 DIAGNOSIS — Z5111 Encounter for antineoplastic chemotherapy: Secondary | ICD-10-CM

## 2024-01-17 DIAGNOSIS — Z7901 Long term (current) use of anticoagulants: Secondary | ICD-10-CM | POA: Insufficient documentation

## 2024-01-17 DIAGNOSIS — C799 Secondary malignant neoplasm of unspecified site: Secondary | ICD-10-CM

## 2024-01-17 DIAGNOSIS — Z86711 Personal history of pulmonary embolism: Secondary | ICD-10-CM

## 2024-01-17 DIAGNOSIS — C20 Malignant neoplasm of rectum: Secondary | ICD-10-CM | POA: Insufficient documentation

## 2024-01-17 DIAGNOSIS — E7800 Pure hypercholesterolemia, unspecified: Secondary | ICD-10-CM | POA: Insufficient documentation

## 2024-01-17 DIAGNOSIS — Z9221 Personal history of antineoplastic chemotherapy: Secondary | ICD-10-CM | POA: Diagnosis not present

## 2024-01-17 DIAGNOSIS — Z939 Artificial opening status, unspecified: Secondary | ICD-10-CM | POA: Diagnosis not present

## 2024-01-17 DIAGNOSIS — C7801 Secondary malignant neoplasm of right lung: Secondary | ICD-10-CM | POA: Insufficient documentation

## 2024-01-17 DIAGNOSIS — T451X5A Adverse effect of antineoplastic and immunosuppressive drugs, initial encounter: Secondary | ICD-10-CM | POA: Insufficient documentation

## 2024-01-17 DIAGNOSIS — Z87891 Personal history of nicotine dependence: Secondary | ICD-10-CM | POA: Diagnosis not present

## 2024-01-17 DIAGNOSIS — I700 Atherosclerosis of aorta: Secondary | ICD-10-CM | POA: Diagnosis not present

## 2024-01-17 DIAGNOSIS — G629 Polyneuropathy, unspecified: Secondary | ICD-10-CM | POA: Insufficient documentation

## 2024-01-17 DIAGNOSIS — C7802 Secondary malignant neoplasm of left lung: Secondary | ICD-10-CM | POA: Insufficient documentation

## 2024-01-17 DIAGNOSIS — Z510 Encounter for antineoplastic radiation therapy: Secondary | ICD-10-CM | POA: Diagnosis not present

## 2024-01-17 DIAGNOSIS — R188 Other ascites: Secondary | ICD-10-CM | POA: Insufficient documentation

## 2024-01-17 DIAGNOSIS — Z5112 Encounter for antineoplastic immunotherapy: Secondary | ICD-10-CM | POA: Insufficient documentation

## 2024-01-17 DIAGNOSIS — K521 Toxic gastroenteritis and colitis: Secondary | ICD-10-CM

## 2024-01-17 DIAGNOSIS — S42294D Other nondisplaced fracture of upper end of right humerus, subsequent encounter for fracture with routine healing: Secondary | ICD-10-CM | POA: Diagnosis not present

## 2024-01-17 LAB — CBC WITH DIFFERENTIAL (CANCER CENTER ONLY)
Abs Immature Granulocytes: 0.03 10*3/uL (ref 0.00–0.07)
Basophils Absolute: 0 10*3/uL (ref 0.0–0.1)
Basophils Relative: 0 %
Eosinophils Absolute: 0 10*3/uL (ref 0.0–0.5)
Eosinophils Relative: 0 %
HCT: 33.5 % — ABNORMAL LOW (ref 36.0–46.0)
Hemoglobin: 10.6 g/dL — ABNORMAL LOW (ref 12.0–15.0)
Immature Granulocytes: 1 %
Lymphocytes Relative: 19 %
Lymphs Abs: 1 10*3/uL (ref 0.7–4.0)
MCH: 30.9 pg (ref 26.0–34.0)
MCHC: 31.6 g/dL (ref 30.0–36.0)
MCV: 97.7 fL (ref 80.0–100.0)
Monocytes Absolute: 0.6 10*3/uL (ref 0.1–1.0)
Monocytes Relative: 12 %
Neutro Abs: 3.5 10*3/uL (ref 1.7–7.7)
Neutrophils Relative %: 68 %
Platelet Count: 309 10*3/uL (ref 150–400)
RBC: 3.43 MIL/uL — ABNORMAL LOW (ref 3.87–5.11)
RDW: 17.4 % — ABNORMAL HIGH (ref 11.5–15.5)
WBC Count: 5.2 10*3/uL (ref 4.0–10.5)
nRBC: 0 % (ref 0.0–0.2)

## 2024-01-17 LAB — CMP (CANCER CENTER ONLY)
ALT: 23 U/L (ref 0–44)
AST: 40 U/L (ref 15–41)
Albumin: 3.4 g/dL — ABNORMAL LOW (ref 3.5–5.0)
Alkaline Phosphatase: 81 U/L (ref 38–126)
Anion gap: 7 (ref 5–15)
BUN: 18 mg/dL (ref 8–23)
CO2: 26 mmol/L (ref 22–32)
Calcium: 9.1 mg/dL (ref 8.9–10.3)
Chloride: 103 mmol/L (ref 98–111)
Creatinine: 0.81 mg/dL (ref 0.44–1.00)
GFR, Estimated: >60 mL/min (ref >60)
Glucose, Bld: 107 mg/dL — ABNORMAL HIGH (ref 70–99)
Potassium: 3.7 mmol/L (ref 3.5–5.1)
Sodium: 136 mmol/L (ref 135–145)
Total Bilirubin: 0.3 mg/dL (ref 0.0–1.2)
Total Protein: 7.1 g/dL (ref 6.5–8.1)

## 2024-01-17 MED ORDER — SODIUM CHLORIDE 0.9 % IV SOLN: 5.0000 mg/kg | Freq: Once | INTRAVENOUS | Status: DC

## 2024-01-17 MED ORDER — SODIUM CHLORIDE 0.9 % IV SOLN
INTRAVENOUS | Status: DC
  Filled 2024-01-17: qty 250

## 2024-01-17 MED ORDER — SODIUM CHLORIDE 0.9 % IV SOLN
375.0000 mg | Freq: Once | INTRAVENOUS | Status: AC
  Administered 2024-01-17: 375 mg via INTRAVENOUS
  Filled 2024-01-17: qty 15

## 2024-01-17 MED ORDER — HEPARIN SOD (PORK) LOCK FLUSH 100 UNIT/ML IV SOLN
500.0000 [IU] | Freq: Once | INTRAVENOUS | Status: AC | PRN
  Administered 2024-01-17: 500 [IU]
  Filled 2024-01-17: qty 5

## 2024-01-17 NOTE — Assessment & Plan Note (Signed)
 Refer to Dr. Everlene Farrier for ostomy evaluation/care supervision.

## 2024-01-17 NOTE — Assessment & Plan Note (Signed)
 Chemotherapy plan as listed above

## 2024-01-17 NOTE — Assessment & Plan Note (Signed)
 Pathological fracture She will get palliative radiation

## 2024-01-17 NOTE — Assessment & Plan Note (Signed)
 continue lomotil  QID PRN as instructed.  She has not needed to take antidiarrhea.

## 2024-01-17 NOTE — Assessment & Plan Note (Signed)
 Hb stable, observation

## 2024-01-17 NOTE — Progress Notes (Signed)
 Per Dr. Cathie Hoops  OK up updated Mvasi dose to 375 mg for current weight of 71.4 kg    Sharen Hones, PharmD, BCPS Clinical Pharmacist

## 2024-01-17 NOTE — Assessment & Plan Note (Signed)
 History of PE and bilateral lower extremity DVT  05/18/23 Repeat right LE US showed chronic non occlusive DVT Continue  Eliquis 2.5 mg twice daily for anticoagulation prophylaxis. Recommend leg elevation and compression stocking.

## 2024-01-17 NOTE — Progress Notes (Signed)
 Hematology/Oncology Progress note [phone] [phone]      CHIEF COMPLAINTS/REASON FOR VISIT:  Follow up for rectal cancer  ASSESSMENT & PLAN:   Cancer Staging  Rectal cancer [facility]) Staging form: Colon and Rectum, AJCC 8th Edition - Pathologic stage from 10/06/2019: Stage IIC (ypT4b, pN0, cM0) - Signed by [name], MD on 10/06/2019 - Clinical stage from 06/30/2020: Jacqueline Yoder - Signed by [name], MD on 04/17/2022 - Pathologic: Stage Unknown (rpTX, pNX, cM1) - Signed by [name], MD on 01/31/2021   Rectal cancer [facility]) History of stage IIIC Rectal cancer, s/p TNT, followed by 09/17/19 APR/posterior vaginectomy/TAH/BSO/VY-flap, pT4b pN0 with close vaginal margin 0.2 mm.  Uterus and ovaries negative for malignancy. palliative radiation to vaginal recurrence- 01/19/21 recurrence with lung metastasis.-Palliative -FOLFIRI plus bevacizumab.  Irinotecan was dropped in November 2022 due to side effects. Negative for UGT1A1*28 - radiographically stable, rise of CEA-July 2023 CT lung metastasis worse--> Dec 2023 PET showed progression in pelvic lymph nodes and bone lesions,--> 2nd line irinotecan +panitumumab-->CT March 2024 stable. --> June 2024 CT showed mild lung progression/Progressive left iliac bone metastasis, vulvar mass difficult for biopsy in office,  Tempus Liquid biopsy - no actionable variants --> 06/25/2023 CT showed progression of lung nodules [SLD increase 61mm] and iliac inguinal lymphadenopathy--> 07/23/23 switch to dose reduced FOLFOX --> Dec 2024 CT- stable disease. --> Feb 2025 disease progression  Labs are reviewed and discussed with patient   CEA nadir was in 490, progressively increases - 10,193  Finished cycle 1 longsurf [60mg  BID day 1-5 and day 8- 12 every 28-day  + Bevacizumab D1 proceed with D15 Bevacizumab Proceed with  cycle 2    Anemia due to antineoplastic chemotherapy Hb stable, observation  Chemotherapy induced diarrhea continue lomotil  QID  PRN as instructed.  She has not needed to take antidiarrhea.   Encounter for antineoplastic chemotherapy Chemotherapy plan as listed above.   History of pulmonary embolism History of PE and bilateral lower extremity DVT  05/18/23 Repeat right LE US showed chronic non occlusive DVT Continue  Eliquis 2.5 mg twice daily for anticoagulation prophylaxis. Recommend leg elevation and compression stocking.   Metastasis to skin [facility]) S/p skin nodule biopsy confirmed metastatic adenocarcinoma consistent with colorectal origin.  Refer to wound care  Proximal humerus fracture Pathological fracture She will get palliative radiation  History of creation of ostomy [facility]) Refer to Dr. Everlene Farrier for ostomy evaluation/care supervision.   Follow up 2 week[s].  All questions were answered. The patient knows to call the clinic with any problems, questions or concerns.  [name], MD, PhD [facility] Hematology Oncology 01/17/2024      HISTORY OF PRESENTING ILLNESS:  Oncology History  Rectal cancer (HCC)  01/23/2019 Initial Diagnosis   Rectal cancer [facility]) Patient initially presented with complaints of postmenopausal bleeding on 08/16/2018.  History of was menopausal vaginal bleeding in 2016 which resulted in cervical polypectomy.  Pathology 02/04/2015 showed cervical polyp, consistent with benign endometrial polyp.  [name] after polypectomy due to anxiety associated with pelvic exams.  pelvic exam on 08/16/2018 reviewed cervical abnormality and from enlarged uterus. Seen by Dr. [name] on 10/29/2018.  Endometrial biopsy and a Pap smear was performed. 10/29/2018 Pap smear showed adenocarcinoma, favor endometrial origin. 10/29/2018 endometrial biopsy showed endometrioid carcinoma, FIGO grade 1.  10/29/2018- TA & TV Ultrasound revealed: Anteverted uterus measuring 8.7 x 5.6 x 6.4 cm without evidence of focal masses.  The endometrium measuring 24.1 mm (thickened) and heterogeneous.  Right and left  ovaries not visualized.  No adnexal masses identified.  No free fluid in cul-de-sac.  Patient was seen by Dr. [name] in clinic on 11/13/2018.  Cervical exam reveals 2 cm exophytic irregular mass consistent with malignancy.    11/19/2018 CT chest abdomen pelvis with contrast showed thickened endometrium with some irregularity compatible with the provided diagnosis of endometrial malignancy.  There is a mildly prominent left inguinal node 1.4 cm.  Patient was seen by Dr. [name] on 11/20/2018 and left groin lymph node biopsy was recommended.  11/26/2018 patient underwent left inguinal lymph node biopsy. Pathology showed metastatic adenocarcinoma consistent with colorectal origin.  CDX 2+.  Case was discussed on tumor board.  Recommend colonoscopy for further evaluation.  Patient reports significant weight loss 30 pounds over the last year.  Chronic vaginal spotting. Change of bowel habits the past few months.  More constipated.  Family history positive for brother who has colon cancer prostate cancer.  patient has underwent colonoscopy on 12/03/2018 which reviewed a nonobstructing large mass in the rectum.  Also chronic fistula.  Mass was not circumferential.  This was biopsied with a cold forceps for histology.  Pathology came back hyperplastic polyp negative for dysplasia and malignancy. [name], patient underwent flex sigmoidoscopy on 12/06/2018 with rebiopsy of the rectal mass. This time biopsy results came back positive for invasive colorectal adenocarcinoma, moderately differentiated. Immunotherapy for nearly mismatch repair protein (MMR ) was performed.  There is no loss of MMR expression.  low probability of MSI high.   # Seen by [name] for evaluation of resectability for rectal cancer. In addition, she also had a second opinion with [facility] pathology where her endometrial biopsy pathology was changed to  adenocarcinoma, consistent with colorectal primary.    [name]. She has home health that has been assisting with ostomy care  Patient was also evaluated by [facility] oncology.  Recommendation is to proceed with TNT with concurrent chemoradiation followed by neoadjuvant chemotherapy followed by surgical resection. Patient prefers to have treatment done locally with [facility].   # Oncology Treatment:  02/03/2019- 03/19/2019  concurrent Xeloda and radiation.  Xeloda dose 825mg  /m2 BID - rounded to 1650mg  BID- on days of radiation. 04/09/2019, started on FOLFOX with bolus early.  Omitted.  07/16/2019 finished 8 cycles of FOLFOX. 09/17/19 APR/posterior vaginectomy/TAH/BSO/VY-flap pT4b pN0 with close vaginal margin 0.2 mm.  Uterus and ovaries negative for malignancy.   10/06/2019 Cancer Staging   Staging form: Colon and Rectum, AJCC 8th Edition - Pathologic stage from 10/06/2019: Stage IIC (ypT4b, pN0, cM0) - Signed by [name], MD on 10/06/2019   06/30/2020 Cancer Staging   Staging form: Colon and Rectum, AJCC 8th Edition - Clinical stage from 06/30/2020: Jacqueline Yoder - Signed by [name], MD on 04/17/2022 Stage prefix: Recurrence Total positive nodes: 1   06/30/2020 Relapse/Recurrence   vaginal introitus mass biopsied. Pathology is consistent with metastatic colorectal adenocarcinoma    07/20/2020 - 09/13/2020 Chemotherapy   Concurrent chemotherapy with Xeloda with Radiation.  08/02/2020-08/16/2020, . Xeloda was held due to neutropenia    08/17/2020 - 09/06/2020 Radiation Therapy   Xeloda 1500 mg twice daily concurrently with radiation    01/31/2021 Cancer Staging   Staging form: Colon and Rectum, AJCC 8th Edition - Pathologic: Stage Unknown (rpTX, pNX, cM1) - Signed by [name], MD on 01/31/2021 Stage prefix: Recurrence   01/31/2021 - 08/20/2021 Chemotherapy   FOLFIRI + Bev  Irinotecan dose  was reduced, eventually 100mg /m2  Irinotecan was dropped in November 2022 due to side effects. Negative for UGT1A1*28    09/13/2021 -   Chemotherapy   maintenance 5-FU/bevacizumab    11/28/2021 -  Chemotherapy   5-FU/Irinotecan/bevacizumab. Irinotecan 100 mg/m2 was added back due to progressively increasing CEA.    07/14/2022 Imaging   Bone scan  1. No evidence of skeletal metastatic disease in the hips. 2. Asymmetric increased activity in the RIGHT humeral head is favored degenerative as described above. If pain in the RIGHT shoulder consider further evaluation with plain film or MRI   07/17/2022 Imaging   1. Stable scattered small bilateral pulmonary nodules, largest 8 by 6 mm in the right middle lobe. 2. Subtle chronically stable posterior cortical irregularity and heterogeneity in the left iliac bone, not appreciable on the bone scan of 07/14/2022. This has been present at least over the past year and could reflect radiation therapy related findings although strictly speaking, malignant involvement of the left iliac bone is difficult to exclude. If clinically warranted this could be further assessed with nuclear medicine PET-CT or MRI of the bony pelvis with and without contrast. 3. Pelvic floor laxity with loops of small bowel extending 3 cm below the pubococcygeal line. 4. Aortic and systemic atherosclerosis as detailed above. Aortic Atherosclerosis    12/26/2022 Imaging   CT chest abdomen pelvis w contrast 1. Stable scattered solid pulmonary nodules. 2. Right inguinal and right pelvic lymph nodes which were hypermetabolic on prior PET-CT are stable. 3. Osseous lesion of the proximal right humerus demonstrates increased sclerosis when compared with the prior PET-CT, possibly due to treatment response. 4. 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 5. Aortic Atherosclerosis     06/25/2023 Imaging   CT chest abdomen pelvis w contrast showed 1. Increased size of the solid bilateral pulmonary nodules,consistent with worsening metastatic disease. 2. Increased size of the right external iliac and inguinal lymph nodes,  concerning for worsening metastatic disease. 3. No significant interval change in the left iliac bone osseous metastasis. 4. Enlarged main pulmonary artery measuring 3.6 cm, which can be seen in the setting of pulmonary arterial hypertension. 5.  Aortic Atherosclerosis (ICD10-I70.0)     Miscellaneous   Tempus liquid biopsy showed no actionable variants.  NOTCH1 missense variant, BRCA2 missense variant , MYC missense variant   07/11/2023 Imaging   PET showed 1. Multifocal tracer avid abdominopelvic lymph nodes are identified compatible with metastatic disease. 2. Multifocal bilateral pulmonary nodules are identified compatible with pulmonary metastasis.  3. Diffuse increased radiotracer uptake throughout the marrow space is identified diminishing sensitivity for detecting osseous metastasis. No abnormal increased radiotracer uptake identified above background increased bone marrow activity to indicate metabolically active bone metastases. 4.  Aortic Atherosclerosis   07/23/2023 - 11/13/2023 Chemotherapy   switch to dose reduced FOLFOX    09/16/2023 Imaging   CT abdomen pelvis w contrast  1. Small-bowel obstruction with transition point in the anterior aspect of the lower abdomen, left of midline. This may be secondary to adhesions. 2. Small volume ascites. 3. Prior colon resection with left anterior abdominal wall colostomy. Adjacent parastomal hernia. Small volume fluid within the hernia sac. 4. Stable pulmonary metastatic disease. 5. Stable metastatic lesion of the left iliac wing. 6. Enlarged right external iliac chain lymph node measuring 1.4 cm, previously 1.3 cm. Several mildly prominent retroperitoneal lymph nodes have increased in size from prior but remain subcentimeter in size. 7. Aortic atherosclerosis (ICD10-I70.0).   09/16/2023 - 09/20/2023 Hospital  Admission   Hospitalized due to Small bowel obstruction due to adhesions    09/24/2023 Imaging   1. Bilateral pulmonary  metastases are all stable from 06/25/2023 chest CT. 2. No thoracic adenopathy. No new or progressive metastatic disease in the chest. 3. Trace bilateral posterior pleural effusions. 4. Two-vessel coronary atherosclerosis. 5. Dilated main pulmonary artery, suggesting pulmonary arterial hypertension. 6.  Aortic Atherosclerosis (ICD10-I70.0).   11/28/2023 Imaging   CT chest abdomen pelvis with contrast 1. Increased size of retroperitoneal, iliac side chain and inguinal lymph nodes, concerning for progressive nodal metastatic disease. 2. Numerous bilateral solid pulmonary nodules are again seen, some of which have increased in size and others are stable in size overall compatible with disease progression. 3. Increased size of the subcapsular lesion in the anterior inferior right lobe of the liver, which has been stable over multiple prior examinations and was not FDG avid on prior PET-CT. Suggest attention on follow-up imaging. 4. 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 5. Questionable wall thickening of matted loops of small bowel in the left lower quadrant, suggest attention on follow-up imaging. 6. Small volume abdominopelvic free fluid. 7.  Aortic Atherosclerosis (ICD10-I70.0).   12/13/2023 -  Chemotherapy   Patient is on Treatment Plan : COLORECTAL Bevacizumab + Trifluridine/Tipiracil q28d     01/01/2024 Procedure   Right inguinal crease skin nodule biopsy  Pathology showed dermal deposits of metastatic colon adenocarcinoma, extending to the edge and base.      01/14/2024 Imaging   PET scan showed  1. Progressive disease with progressive left supraclavicular adenopathy, progressive pulmonary metastatic disease, progressive abdominal and pelvic adenopathy, and progressive multifocal skeletal involvement. 2. Substantial multifocal skeletal involvement including the right proximal humerus, right medial clavicle, left T11 vertebral  body, L4 posterior elements, and entire left iliac crest. The permeative lesion of the right humerus may have some extraosseous extension. Similarly there is cortical breakthrough in the large left iliac crest lesion. 3. Hypermetabolic irregular tumor extends along the right inguinal ring region and right hip adductor musculature as well as in the subcutaneous and cutaneous tissues along the right inguinal fold, compatible with tumor involvement. 4. Right eccentric vulvar activity just below the clitoris has a maximum SUV of 7.5, and given the regional soft tissue metastatic disease, a metastatic focus in this vicinity is not excluded. 5. The subcapsular lesion along the anterior inferior right hepatic lobe described on CT of 11/28/2023 is not discernibly hypermetabolic or visible on today's PET-CT. 6. Descending left colostomy with mild diverticulosis. 7.  Aortic Atherosclerosis (ICD10-I70.0).    Metastatic adenocarcinoma (HCC)  12/13/2023 -  Chemotherapy   Patient is on Treatment Plan : COLORECTAL Bevacizumab + Trifluridine/Tipiracil q28d         INTERVAL HISTORY Jacqueline Yoder is a 78 y.o. female who has above history reviewed by me presents for follow-up of rectal cancer. She was accompanied by her daughter. + manageable neuropathy symptoms of her fingertips, intermittent,  worse when exposed to cold temperature.  + chronic loose ostomy output, stable, not worse.  + Right humerous fracture, pain is constrolled.  Review of Systems  Constitutional:  Positive for fatigue. Negative for appetite change, chills, fever and unexpected weight change.  HENT:   Negative for hearing loss and voice change.   Eyes:  Negative for eye problems.  Respiratory:  Negative for chest tightness and cough.   Cardiovascular:  Negative for chest pain.  Gastrointestinal:  Positive for diarrhea. Negative for  abdominal distention, abdominal pain, blood in stool, constipation and nausea.  Endocrine:  Negative for hot flashes.  Genitourinary:  Negative for difficulty urinating and frequency.   Musculoskeletal:  Positive for arthralgias.        right shoulder pain.   Skin:  Positive for rash. Negative for itching.  Neurological:  Positive for numbness. Negative for extremity weakness.  Hematological:  Negative for adenopathy.  Psychiatric/Behavioral:  Negative for confusion.     MEDICAL HISTORY:  Past Medical History:  Diagnosis Date   Allergy    Arthritis    Blood clot in vein    Family history of colon cancer    GERD (gastroesophageal reflux disease)    Hypercholesteremia    Hypertension    Hypertension    Lower extremity edema    Personal history of chemotherapy    Rectal cancer (HCC) 12/2018   Urinary incontinence     SURGICAL HISTORY: Past Surgical History:  Procedure Laterality Date   ABDOMINAL HYSTERECTOMY     CHOLECYSTECTOMY  1971   COLONOSCOPY WITH PROPOFOL N/A 12/03/2018   Procedure: COLONOSCOPY WITH PROPOFOL;  Surgeon: Midge Minium, MD;  Location: [facility] ENDOSCOPY;  Service: Endoscopy;  Laterality: N/A;   COLONOSCOPY WITH PROPOFOL N/A 07/15/2020   Procedure: COLONOSCOPY WITH PROPOFOL;  Surgeon: Wyline Mood, MD;  Location: [facility] ENDOSCOPY;  Service: Gastroenterology;  Laterality: N/A;   FLEXIBLE SIGMOIDOSCOPY N/A 12/06/2018   Procedure: FLEXIBLE SIGMOIDOSCOPY;  Surgeon: Wyline Mood, MD;  Location: [facility] ENDOSCOPY;  Service: Endoscopy;  Laterality: N/A;   LAPAROSCOPIC COLOSTOMY  01/06/2019   PORTACATH PLACEMENT N/A 04/03/2019   Procedure: INSERTION PORT-A-CATH;  Surgeon: Leafy Ro, MD;  Location: [facility] ORS;  Service: General;  Laterality: N/A;    SOCIAL HISTORY: Social History   Socioeconomic History   Marital status: Widowed    Spouse name: Not on file   Number of children: Not on file   Years of education: Not on file   Highest education level: Some college, no degree  Occupational History   Not on file  Tobacco Use   Smoking status: Former    Current  packs/day: 0.00    Types: Cigarettes    Quit date: 12/02/1977    Years since quitting: 46.1   Smokeless tobacco: Former  Building services engineer status: Never Used  Substance and Sexual Activity   Alcohol use: Never   Drug use: Never   Sexual activity: Not Currently    Birth control/protection: None  Other Topics Concern   Not on file  Social History Narrative   Lives with daughter   Social Drivers of Health   Financial Resource Strain: Low Risk  (11/15/2023)   Overall Financial Resource Strain (CARDIA)    Difficulty of Paying Living Expenses: Not hard at all  Food Insecurity: No Food Insecurity (11/15/2023)   Hunger Vital Sign    Worried About Running Out of Food in the Last Year: Never true    Ran Out of Food in the Last Year: Never true  Transportation Needs: No Transportation Needs (11/15/2023)   PRAPARE - Administrator, Civil Service (Medical): No    Lack of Transportation (Non-Medical): No  Physical Activity: Insufficiently Active (11/15/2023)   Exercise Vital Sign    Days of Exercise per Week: 3 days    Minutes of Exercise per Session: 10 min  Stress: No Stress Concern Present (11/15/2023)   [employer] of Occupational Health - Occupational Stress Questionnaire    Feeling of Stress : Not  at all  Social Connections: Moderately Integrated (11/15/2023)   Social Connection and Isolation Panel [NHANES]    Frequency of Communication with Friends and Family: More than three times a week    Frequency of Social Gatherings with Friends and Family: More than three times a week    Attends Religious Services: More than 4 times per year    Active Member of [employer] or Organizations: Yes    Attends Banker Meetings: More than 4 times per year    Marital Status: Widowed  Intimate Partner Violence: Not At Risk (09/27/2023)   Humiliation, Afraid, Rape, and Kick questionnaire    Fear of Current or Ex-Partner: No    Emotionally Abused: No    Physically Abused: No     Sexually Abused: No    FAMILY HISTORY: Family History  Problem Relation Age of Onset   Colon cancer Brother 70       exposure to chemicals [place]   Hypertension Mother    Stroke Mother    Kidney failure Father    Breast cancer Neg Hx    Ovarian cancer Neg Hx     ALLERGIES:  is allergic to sulfamethoxazole-trimethoprim.  MEDICATIONS:  Current Outpatient Medications  Medication Sig Dispense Refill   acetaminophen (TYLENOL) 650 MG CR tablet Take 650 mg by mouth every 8 (eight) hours as needed for pain.     Cholecalciferol (VITAMIN D3) 2000 units capsule Take 2,000 Units by mouth daily.     clindamycin (CLINDAGEL) 1 % gel Apply topically 2 (two) times daily. 30 g 5   diclofenac sodium (VOLTAREN) 1 % GEL Apply 2 g topically 4 (four) times daily as needed (joint pain).  11   diphenoxylate-atropine (LOMOTIL) 2.5-0.025 MG tablet Take 1 tablet by mouth 4 (four) times daily as needed for diarrhea or loose stools. 120 tablet 0   ELIQUIS 2.5 MG TABS tablet TAKE 1 TABLET BY MOUTH TWICE  DAILY 200 tablet 2   fluticasone (FLONASE) 50 MCG/ACT nasal spray Place 1 spray into both nostrils daily as needed for allergies or rhinitis.     ketoconazole (NIZORAL) 2 % cream Apply 1 Application topically daily. For up to 2 weeks. May repeat if need. 30 g 2   lidocaine-prilocaine (EMLA) cream Apply 1 Application topically daily as needed. Apply small amount to port and cover with saran wrap 1-2 hours prior to port access 30 g 3   loratadine (CLARITIN) 10 MG tablet Take 10 mg by mouth daily.     magnesium chloride (SLOW-MAG) 64 MG TBEC SR tablet Take 1 tablet (64 mg total) by mouth 2 (two) times daily. 60 tablet 2   Multiple Vitamins-Minerals (ONE-A-DAY WOMENS 50 PLUS PO) Take 1 tablet by mouth daily.      potassium chloride SA (KLOR-CON M) 20 MEQ tablet TAKE 1 TABLET BY MOUTH DAILY 100 tablet 2   simvastatin (ZOCOR) 40 MG tablet Take 1 tablet (40 mg total) by mouth at bedtime. 100 tablet 3    triamterene-hydrochlorothiazide (DYAZIDE) 37.5-25 MG capsule Take 1 each (1 capsule total) by mouth daily. 90 capsule 1   zinc gluconate 50 MG tablet Take 50 mg by mouth daily.     gentamicin cream (GARAMYCIN) 0.1 % Apply to affected toe once daily. (Patient not taking: Reported on 12/21/2023) 30 g 1   nystatin (MYCOSTATIN/NYSTOP) powder APPLY 1 POWDER TOPICALLY THREE TIMES DAILY (Patient not taking: No sig reported) 45 g 0   ondansetron (ZOFRAN) 8 MG tablet Take 1 tablet (8 mg  total) by mouth every 8 (eight) hours as needed for nausea or vomiting. (Patient not taking: Reported on 12/21/2023) 30 tablet 1   prochlorperazine (COMPAZINE) 10 MG tablet Take 1 tablet (10 mg total) by mouth every 6 (six) hours as needed (NAUSEA). (Patient not taking: Reported on 01/17/2024) 30 tablet 1   tiZANidine (ZANAFLEX) 4 MG tablet Take 0.5 tablets (2 mg total) by mouth every 8 (eight) hours as needed for muscle spasms. (Patient not taking: Reported on 12/27/2023) 10 tablet 0   triamcinolone cream (KENALOG) 0.5 % Apply 1 Application topically 2 (two) times daily. (Patient not taking: Reported on 01/17/2024) 90 g 1   trifluridine-tipiracil (LONSURF) 20-8.19 MG tablet Take 3 tablets (60 mg of trifluridine total) by mouth 2 (two) times daily. 1hr after AM & PM meals days 1-5, 8-12. Repeat every 28d. (Patient not taking: Reported on 01/17/2024) 60 tablet 1   No current facility-administered medications for this visit.   Facility-Administered Medications Ordered in Other Visits  Medication Dose Route Frequency Provider Last Rate Last Admin   heparin lock flush 100 UNIT/ML injection              PHYSICAL EXAMINATION: ECOG PERFORMANCE STATUS: 1 - Symptomatic but completely ambulatory  Physical Exam Constitutional:      General: She is not in acute distress. HENT:     Head: Normocephalic and atraumatic.  Eyes:     General: No scleral icterus. Cardiovascular:     Rate and Rhythm: Normal rate.     Heart sounds: Normal heart  sounds.  Pulmonary:     Effort: Pulmonary effort is normal. No respiratory distress.     Breath sounds: No wheezing.  Abdominal:     General: Bowel sounds are normal. There is no distension.     Palpations: Abdomen is soft.     Comments: + Colostomy bag   Musculoskeletal:        General: No deformity. Normal range of motion.     Cervical back: Normal range of motion and neck supple.  Skin:    General: Skin is warm and dry.  Neurological:     Mental Status: She is alert and oriented to person, place, and time. Mental status is at baseline.       LABORATORY DATA:  I have reviewed the data as listed    Latest Ref Rng & Units 01/17/2024   10:42 AM 01/10/2024    8:27 AM 12/27/2023    8:41 AM  CBC  WBC 4.0 - 10.5 K/uL 5.2  3.0  4.6   Hemoglobin 12.0 - 15.0 g/dL 16.1  09.6  04.5   Hematocrit 36.0 - 46.0 % 33.5  33.1  32.4   Platelets 150 - 400 K/uL 309  252  183       Latest Ref Rng & Units 01/17/2024   10:42 AM 01/10/2024    8:27 AM 12/27/2023    8:41 AM  CMP  Glucose 70 - 99 mg/dL 409  811  914   BUN 8 - 23 mg/dL 18  17  13    Creatinine 0.44 - 1.00 mg/dL 7.82  9.56  2.13   Sodium 135 - 145 mmol/L 136  138  137   Potassium 3.5 - 5.1 mmol/L 3.7  3.9  3.6   Chloride 98 - 111 mmol/L 103  100  104   CO2 22 - 32 mmol/L 26  27  27    Calcium 8.9 - 10.3 mg/dL 9.1  9.3  9.1  Total Protein 6.5 - 8.1 g/dL 7.1  7.4  6.6   Total Bilirubin 0.0 - 1.2 mg/dL 0.3  0.6  0.6   Alkaline Phos 38 - 126 U/L 81  86  77   AST 15 - 41 U/L 40  33  32   ALT 0 - 44 U/L 23  18  18       RADIOGRAPHIC STUDIES: I have personally reviewed the radiological images as listed and agreed with the findings in the report. NM PET Image Restag (PS) Skull Base To Thigh Result Date: 01/17/2024 CLINICAL DATA:  Subsequent treatment strategy for rectal cancer. EXAM: NUCLEAR MEDICINE PET SKULL BASE TO THIGH TECHNIQUE: 8.5 mCi F-18 FDG was injected intravenously. Full-ring PET imaging was performed from the skull base to  thigh after the radiotracer. CT data was obtained and used for attenuation correction and anatomic localization. Fasting blood glucose: 81 mg/dl COMPARISON:  16/07/9603, and CT scan of 11/28/2023 FINDINGS: Mediastinal blood pool activity: SUV max 2.1 Liver activity: SUV max NA NECK: No significant abnormal hypermetabolic activity in this region. Incidental CT findings: None. CHEST: Compared to prior PET-CT there is progressive left supraclavicular adenopathy measuring up to 1.2 cm in short axis with maximum SUV of 13.4 (formerly 4.9). Numerous scattered pulmonary nodules throughout both lungs, index lesion in the right middle lobe measuring 1.6 by 1.4 cm on image 68 series 6 with maximum SUV 11.5 (previously 4.3). Many of the nodules are new compared to the prior PET-CT, these are roughly similar in distribution to the CT chest from 11/28/2023. Incidental small lesion in the adipose tissues just above the left scapula, likely a lymph node, measuring 4 mm in short axis on image 35 series 6, maximum SUV 3.3. Incidental CT findings: Right Port-A-Cath tip: Right atrium. ABDOMEN/PELVIS: The subcapsular lesion along the anterior inferior right hepatic lobe described on CT of 11/28/2023 is not discernibly hypermetabolic or visible on today's PET-CT. Abnormal hypermetabolic retrocrural and retroperitoneal adenopathy along with hypermetabolic right common iliac, bilateral external iliac, and bilateral inguinal adenopathy. Index left periaortic lymph node on image 95 series 6 measures 1.4 cm in short axis with maximum SUV 12.8. Similar size on CT from 11/28/2023, back on 07/09/2023 this node measured 0.7 cm with maximum SUV 4.2. Hypermetabolic irregular tumor extends along the right inguinal ring region and right hip adductor musculature as well as in the subcutaneous and cutaneous tissues along the right inguinal fold, compatible with tumor involvement. The multifocal cutaneous involvement has a maximum SUV of 10.1. Right  eccentric vulvar activity just below the clitoris has a maximum SUV of 7.5, and given the regional soft tissue metastatic disease, a metastatic focus in this vicinity is not excluded. Incidental CT findings: Benign renal cysts. No further imaging workup of these lesions is indicated. Atherosclerosis is present, including aortoiliac atherosclerotic disease. Descending left colostomy noted with mild diverticulosis. SKELETON: Substantial multifocal skeletal involvement including the right proximal humerus, right medial clavicle, left T11 vertebral body, L4 posterior elements, and entire left iliac crest. The permeative lesion of the right humerus may have some extraosseous extension, maximum SUV 16.0. Similarly there is cortical breakthrough in the large left iliac crest lesion which has a maximum SUV of 14.2. Incidental CT findings: None. IMPRESSION: 1. Progressive disease with progressive left supraclavicular adenopathy, progressive pulmonary metastatic disease, progressive abdominal and pelvic adenopathy, and progressive multifocal skeletal involvement. 2. Substantial multifocal skeletal involvement including the right proximal humerus, right medial clavicle, left T11 vertebral body, L4 posterior elements, and entire left  iliac crest. The permeative lesion of the right humerus may have some extraosseous extension. Similarly there is cortical breakthrough in the large left iliac crest lesion. 3. Hypermetabolic irregular tumor extends along the right inguinal ring region and right hip adductor musculature as well as in the subcutaneous and cutaneous tissues along the right inguinal fold, compatible with tumor involvement. 4. Right eccentric vulvar activity just below the clitoris has a maximum SUV of 7.5, and given the regional soft tissue metastatic disease, a metastatic focus in this vicinity is not excluded. 5. The subcapsular lesion along the anterior inferior right hepatic lobe described on CT of 11/28/2023 is  not discernibly hypermetabolic or visible on today's PET-CT. 6. Descending left colostomy with mild diverticulosis. 7.  Aortic Atherosclerosis (ICD10-I70.0). Electronically Signed   By: Gaylyn Rong M.D.   On: 01/17/2024 09:48   DG Shoulder Right Result Date: 12/27/2023 CLINICAL DATA:  Chronic right shoulder pain without known injury. EXAM: RIGHT SHOULDER - 2+ VIEW COMPARISON:  None Available. FINDINGS: Possible nondisplaced fracture involving greater tuberosity. No dislocation is noted. Moderate degenerative changes are seen involving the right glenohumeral and acromioclavicular joints. Possible Hill-Sachs deformity suggesting prior dislocations. IMPRESSION: Possible nondisplaced fracture involving greater tuberosity of proximal humerus. Degenerative changes as noted above. Electronically Signed   By: Lupita Raider M.D.   On: 12/27/2023 09:51   VAS [place] LOWER EXTREMITY VENOUS REFLUX Result Date: 12/21/2023  Lower Venous Reflux Study Patient Name:  Jacqueline Yoder  Date of Exam:   12/20/2023 Medical Rec #: [id number]       Accession #:    [id number] Date of Birth: [birth year]       Patient Gender: F Patient Age:   78 years Exam Location:  [facility] Procedure:      VAS [place] LOWER EXTREMITY VENOUS REFLUX Referring Phys: Sheppard Plumber --------------------------------------------------------------------------------  Indications: Pain, Swelling, Edema, and rt knee.  Performing Technologist: Salvadore Farber RVT  Examination Guidelines: A complete evaluation includes B-mode imaging, spectral Doppler, color Doppler, and power Doppler as needed of all accessible portions of each vessel. Bilateral testing is considered an integral part of a complete examination. Limited examinations for reoccurring indications may be performed as noted. The reflux portion of the exam is performed with the patient in reverse Trendelenburg. Significant venous reflux is defined as >500 ms in the superficial venous system, and >1  second in the deep venous system.   Summary: Right: - No evidence of deep vein thrombosis seen in the right lower extremity, from the common femoral through the popliteal veins. - No evidence of superficial venous thrombosis in the right lower extremity. - There is no evidence of venous reflux seen in the right lower extremity. - No evidence of superficial venous reflux seen in the right greater saphenous vein. - No evidence of superficial venous reflux seen in the right short saphenous vein.  *See table(s) above for measurements and observations. Electronically signed by Levora Dredge MD on 12/21/2023 at 8:33:40 AM.    Final    CT CHEST ABDOMEN PELVIS W CONTRAST Result Date: 11/28/2023 CLINICAL DATA:  Rectal cancer, follow-up.  * Tracking Code: BO * EXAM: CT CHEST, ABDOMEN, AND PELVIS WITH CONTRAST TECHNIQUE: Multidetector CT imaging of the chest, abdomen and pelvis was performed following the standard protocol during bolus administration of intravenous contrast. RADIATION DOSE REDUCTION: This exam was performed according to the departmental dose-optimization program which includes automated exposure control, adjustment of the mA and/or kV according to patient size and/or  use of iterative reconstruction technique. CONTRAST:  85mL OMNIPAQUE IOHEXOL 300 MG/ML  SOLN COMPARISON:  Multiple priors including CT September 24, 2023 FINDINGS: CT CHEST FINDINGS Cardiovascular: Right chest Port-A-Cath with tip in the right atrium. Thoracic aortic atherosclerosis. Dilated main pulmonary artery. Coronary artery calcifications. Normal size heart. Mediastinum/Nodes: Prominent supraclavicular lymph nodes measure 9 mm on image 6/2 previously 7 mm. No pathologically enlarged mediastinal, hilar or axillary lymph nodes. No suspicious thyroid nodule.  Tiny hiatal hernia. Lungs/Pleura: Numerous bilateral solid pulmonary nodules are again seen. Some of which have increased in size and others are stable in size. For reference:: -medial  right middle lobe pulmonary nodule measures 18 x 16 mm on image 95/3 previously 16 x 14 mm. -anterior left lower lobe pulmonary nodule measures 7 mm on image 111/3, unchanged. -left upper lobe pulmonary nodule measures 9 mm on image 65/3 previously 7 mm. Musculoskeletal: No aggressive lytic or blastic lesion of bone. Multilevel degenerative change of the spine. CT ABDOMEN PELVIS FINDINGS Hepatobiliary: Increased size of the subcapsular lesion in the anterior inferior right lobe of the liver now measuring 10 mm on image 69/2 previously 8 mm. Gallbladder is surgically absent. No biliary ductal dilation. Pancreas: No pancreatic ductal dilation or evidence of acute inflammation. Spleen: No splenomegaly.  Stable splenic lymphangioma/cysts. Adrenals/Urinary Tract: No suspicious adrenal nodule. Stable bilateral renal cysts. No hydronephrosis. Urinary bladder is unremarkable for degree of distension. Stomach/Bowel: Radiopaque enteric contrast material traverses the transverse colon. Stomach is unremarkable for degree of distension. No pathologic dilation of small or large bowel. Questionable wall thickening of matted loops of small bowel in the left lower quadrant on image 89/2. Prior partial colectomy and rectal resection with left anterior abdominal wall colostomy. Fluid and soft tissue stranding in the mesorectal/presacral space without discrete enhancing soft tissue nodularity. Vascular/Lymphatic: Aortic atherosclerosis. Increased size of retroperitoneal, iliac side chain and inguinal lymph nodes. For reference: Left periaortic lymph node measures 12 mm in short axis on image 66/2 previously 9 mm. -right external iliac lymph node measuring 18 mm in short axis on image 98/2 previously measured 14 mm -Right pelvic sidewall lymph node measures 10 mm in short axis on image 93/2 previously 7 mm. Reproductive: Uterus is surgically absent. Other: Small volume abdominopelvic free fluid. Postsurgical change in the abdominal  wall. Musculoskeletal: Similar appearance of the permeative lytic metastatic lesion in the left iliac wing. No new aggressive lytic or blastic lesion of bone. Multilevel degenerative change of the spine. Demineralization of bone. IMPRESSION: 1. Increased size of retroperitoneal, iliac side chain and inguinal lymph nodes, concerning for progressive nodal metastatic disease. 2. Numerous bilateral solid pulmonary nodules are again seen, some of which have increased in size and others are stable in size overall compatible with disease progression. 3. Increased size of the subcapsular lesion in the anterior inferior right lobe of the liver, which has been stable over multiple prior examinations and was not FDG avid on prior PET-CT. Suggest attention on follow-up imaging. 4. 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 5. Questionable wall thickening of matted loops of small bowel in the left lower quadrant, suggest attention on follow-up imaging. 6. Small volume abdominopelvic free fluid. 7.  Aortic Atherosclerosis (ICD10-I70.0). Electronically Signed   By: Maudry Mayhew M.D.   On: 11/28/2023 15:20   DG Bone Density Result Date: 11/19/2023 EXAM: DUAL X-RAY ABSORPTIOMETRY (DXA) FOR BONE MINERAL DENSITY IMPRESSION: Your patient Jacqueline Yoder completed a BMD test on 11/19/2023 using the [employer]  iDXA DXA System (software version: 14.10) manufactured by [employer]. The following summarizes the results of our evaluation. Technologist:VLM PATIENT BIOGRAPHICAL: Name: Zamariya, Neal Patient ID: [id number] Birth Date: [birth year] Height: 64.0 in. Gender: Female Exam Date: 11/19/2023 Weight: 171.2 lbs. Indications: Chemo, Colon CA, Hysterectomy Fractures: Treatments: Vitamin D DENSITOMETRY RESULTS: Site      Region        Measured Date Measured Age WHO Classification Young Adult T-score BMD         %Change vs. Previous Significant Change (*) AP Spine L1-L4 (L2,L3) 11/19/2023 76.6  Normal 0.2 1.203 g/cm2 - - DualFemur Total Right 11/19/2023 76.6 Normal -0.5 0.946 g/cm2 - - ASSESSMENT: The BMD measured at Femur Total Right is 0.946 g/cm2 with a T-score of -0.5. This patient is considered normal according to World Health Organization [facility]) criteria. L-2 & L-3 were excluded due to degenerative changes. The scan quality is good. World Science writer [facility]) criteria for post-menopausal, Caucasian Women: Normal:                   T-score at or above -1 SD Osteopenia/low bone mass: T-score between -1 and -2.5 SD Osteoporosis:             T-score at or below -2.5 SD RECOMMENDATIONS: 1. All patients should optimize calcium and vitamin D intake. 2. Consider FDA-approved medical therapies in postmenopausal women and men aged 16 years and older, based on the following: a. A hip or vertebral(clinical or morphometric) fracture b. T-score < -2.5 at the femoral neck or spine after appropriate evaluation to exclude secondary causes c. Low bone mass (T-score between -1.0 and -2.5 at the femoral neck or spine) and a 10-year probability of a hip fracture > 3% or a 10-year probability of a major osteoporosis-related fracture > 20% based on the [place]-adapted WHO algorithm 3. Clinician judgment and/or patient preferences may indicate treatment for people with 10-year fracture probabilities above or below these levels FOLLOW-UP: People with diagnosed cases of osteoporosis or at high risk for fracture should have regular bone mineral density tests. For patients eligible for Medicare, routine testing is allowed once every 2 years. The testing frequency can be increased to one year for patients who have rapidly progressing disease, those who are receiving or discontinuing medical therapy to restore bone mass, or have additional risk factors. I have reviewed this report, and agree with the above findings. [facility] Electronically Signed   By: Frederico Hamman M.D.   On: 11/19/2023 10:00      NM  PET Image Restag (PS) Skull Base To Thigh Result Date: 01/17/2024 CLINICAL DATA:  Subsequent treatment strategy for rectal cancer. EXAM: NUCLEAR MEDICINE PET SKULL BASE TO THIGH TECHNIQUE: 8.5 mCi F-18 FDG was injected intravenously. Full-ring PET imaging was performed from the skull base to thigh after the radiotracer. CT data was obtained and used for attenuation correction and anatomic localization. Fasting blood glucose: 81 mg/dl COMPARISON:  40/98/1191, and CT scan of 11/28/2023 FINDINGS: Mediastinal blood pool activity: SUV max 2.1 Liver activity: SUV max NA NECK: No significant abnormal hypermetabolic activity in this region. Incidental CT findings: None. CHEST: Compared to prior PET-CT there is progressive left supraclavicular adenopathy measuring up to 1.2 cm in short axis with maximum SUV of 13.4 (formerly 4.9). Numerous scattered pulmonary nodules throughout both lungs, index lesion in the right middle lobe measuring 1.6 by 1.4 cm on image 68 series 6 with maximum SUV 11.5 (previously 4.3). Many of the nodules  are new compared to the prior PET-CT, these are roughly similar in distribution to the CT chest from 11/28/2023. Incidental small lesion in the adipose tissues just above the left scapula, likely a lymph node, measuring 4 mm in short axis on image 35 series 6, maximum SUV 3.3. Incidental CT findings: Right Port-A-Cath tip: Right atrium. ABDOMEN/PELVIS: The subcapsular lesion along the anterior inferior right hepatic lobe described on CT of 11/28/2023 is not discernibly hypermetabolic or visible on today's PET-CT. Abnormal hypermetabolic retrocrural and retroperitoneal adenopathy along with hypermetabolic right common iliac, bilateral external iliac, and bilateral inguinal adenopathy. Index left periaortic lymph node on image 95 series 6 measures 1.4 cm in short axis with maximum SUV 12.8. Similar size on CT from 11/28/2023, back on 07/09/2023 this node measured 0.7 cm with maximum SUV 4.2.  Hypermetabolic irregular tumor extends along the right inguinal ring region and right hip adductor musculature as well as in the subcutaneous and cutaneous tissues along the right inguinal fold, compatible with tumor involvement. The multifocal cutaneous involvement has a maximum SUV of 10.1. Right eccentric vulvar activity just below the clitoris has a maximum SUV of 7.5, and given the regional soft tissue metastatic disease, a metastatic focus in this vicinity is not excluded. Incidental CT findings: Benign renal cysts. No further imaging workup of these lesions is indicated. Atherosclerosis is present, including aortoiliac atherosclerotic disease. Descending left colostomy noted with mild diverticulosis. SKELETON: Substantial multifocal skeletal involvement including the right proximal humerus, right medial clavicle, left T11 vertebral body, L4 posterior elements, and entire left iliac crest. The permeative lesion of the right humerus may have some extraosseous extension, maximum SUV 16.0. Similarly there is cortical breakthrough in the large left iliac crest lesion which has a maximum SUV of 14.2. Incidental CT findings: None. IMPRESSION: 1. Progressive disease with progressive left supraclavicular adenopathy, progressive pulmonary metastatic disease, progressive abdominal and pelvic adenopathy, and progressive multifocal skeletal involvement. 2. Substantial multifocal skeletal involvement including the right proximal humerus, right medial clavicle, left T11 vertebral body, L4 posterior elements, and entire left iliac crest. The permeative lesion of the right humerus may have some extraosseous extension. Similarly there is cortical breakthrough in the large left iliac crest lesion. 3. Hypermetabolic irregular tumor extends along the right inguinal ring region and right hip adductor musculature as well as in the subcutaneous and cutaneous tissues along the right inguinal fold, compatible with tumor involvement.  4. Right eccentric vulvar activity just below the clitoris has a maximum SUV of 7.5, and given the regional soft tissue metastatic disease, a metastatic focus in this vicinity is not excluded. 5. The subcapsular lesion along the anterior inferior right hepatic lobe described on CT of 11/28/2023 is not discernibly hypermetabolic or visible on today's PET-CT. 6. Descending left colostomy with mild diverticulosis. 7.  Aortic Atherosclerosis (ICD10-I70.0). Electronically Signed   By: Gaylyn Rong M.D.   On: 01/17/2024 09:48   DG Shoulder Right Result Date: 12/27/2023 CLINICAL DATA:  Chronic right shoulder pain without known injury. EXAM: RIGHT SHOULDER - 2+ VIEW COMPARISON:  None Available. FINDINGS: Possible nondisplaced fracture involving greater tuberosity. No dislocation is noted. Moderate degenerative changes are seen involving the right glenohumeral and acromioclavicular joints. Possible Hill-Sachs deformity suggesting prior dislocations. IMPRESSION: Possible nondisplaced fracture involving greater tuberosity of proximal humerus. Degenerative changes as noted above. Electronically Signed   By: Lupita Raider M.D.   On: 12/27/2023 09:51   VAS [place] LOWER EXTREMITY VENOUS REFLUX Result Date: 12/21/2023  Lower Venous Reflux Study Patient  Name:  Jacqueline Yoder  Date of Exam:   12/20/2023 Medical Rec #: [id number]       Accession #:    [id number] Date of Birth: 01/21/1946       Patient Gender: F Patient Age:   73 years Exam Location:  [facility] Procedure:      VAS [place] LOWER EXTREMITY VENOUS REFLUX Referring Phys: Sheppard Plumber --------------------------------------------------------------------------------  Indications: Pain, Swelling, Edema, and rt knee.  Performing Technologist: Salvadore Farber RVT  Examination Guidelines: A complete evaluation includes B-mode imaging, spectral Doppler, color Doppler, and power Doppler as needed of all accessible portions of each vessel. Bilateral testing is considered  an integral part of a complete examination. Limited examinations for reoccurring indications may be performed as noted. The reflux portion of the exam is performed with the patient in reverse Trendelenburg. Significant venous reflux is defined as >500 ms in the superficial venous system, and >1 second in the deep venous system.   Summary: Right: - No evidence of deep vein thrombosis seen in the right lower extremity, from the common femoral through the popliteal veins. - No evidence of superficial venous thrombosis in the right lower extremity. - There is no evidence of venous reflux seen in the right lower extremity. - No evidence of superficial venous reflux seen in the right greater saphenous vein. - No evidence of superficial venous reflux seen in the right short saphenous vein.  *See table(s) above for measurements and observations. Electronically signed by Levora Dredge MD on 12/21/2023 at 8:33:40 AM.    Final    CT CHEST ABDOMEN PELVIS W CONTRAST Result Date: 11/28/2023 CLINICAL DATA:  Rectal cancer, follow-up.  * Tracking Code: BO * EXAM: CT CHEST, ABDOMEN, AND PELVIS WITH CONTRAST TECHNIQUE: Multidetector CT imaging of the chest, abdomen and pelvis was performed following the standard protocol during bolus administration of intravenous contrast. RADIATION DOSE REDUCTION: This exam was performed according to the departmental dose-optimization program which includes automated exposure control, adjustment of the mA and/or kV according to patient size and/or use of iterative reconstruction technique. CONTRAST:  85mL OMNIPAQUE IOHEXOL 300 MG/ML  SOLN COMPARISON:  Multiple priors including CT September 24, 2023 FINDINGS: CT CHEST FINDINGS Cardiovascular: Right chest Port-A-Cath with tip in the right atrium. Thoracic aortic atherosclerosis. Dilated main pulmonary artery. Coronary artery calcifications. Normal size heart. Mediastinum/Nodes: Prominent supraclavicular lymph nodes measure 9 mm on image 6/2 previously  7 mm. No pathologically enlarged mediastinal, hilar or axillary lymph nodes. No suspicious thyroid nodule.  Tiny hiatal hernia. Lungs/Pleura: Numerous bilateral solid pulmonary nodules are again seen. Some of which have increased in size and others are stable in size. For reference:: -medial right middle lobe pulmonary nodule measures 18 x 16 mm on image 95/3 previously 16 x 14 mm. -anterior left lower lobe pulmonary nodule measures 7 mm on image 111/3, unchanged. -left upper lobe pulmonary nodule measures 9 mm on image 65/3 previously 7 mm. Musculoskeletal: No aggressive lytic or blastic lesion of bone. Multilevel degenerative change of the spine. CT ABDOMEN PELVIS FINDINGS Hepatobiliary: Increased size of the subcapsular lesion in the anterior inferior right lobe of the liver now measuring 10 mm on image 69/2 previously 8 mm. Gallbladder is surgically absent. No biliary ductal dilation. Pancreas: No pancreatic ductal dilation or evidence of acute inflammation. Spleen: No splenomegaly.  Stable splenic lymphangioma/cysts. Adrenals/Urinary Tract: No suspicious adrenal nodule. Stable bilateral renal cysts. No hydronephrosis. Urinary bladder is unremarkable for degree of distension. Stomach/Bowel: Radiopaque enteric contrast material traverses  the transverse colon. Stomach is unremarkable for degree of distension. No pathologic dilation of small or large bowel. Questionable wall thickening of matted loops of small bowel in the left lower quadrant on image 89/2. Prior partial colectomy and rectal resection with left anterior abdominal wall colostomy. Fluid and soft tissue stranding in the mesorectal/presacral space without discrete enhancing soft tissue nodularity. Vascular/Lymphatic: Aortic atherosclerosis. Increased size of retroperitoneal, iliac side chain and inguinal lymph nodes. For reference: Left periaortic lymph node measures 12 mm in short axis on image 66/2 previously 9 mm. -right external iliac lymph node  measuring 18 mm in short axis on image 98/2 previously measured 14 mm -Right pelvic sidewall lymph node measures 10 mm in short axis on image 93/2 previously 7 mm. Reproductive: Uterus is surgically absent. Other: Small volume abdominopelvic free fluid. Postsurgical change in the abdominal wall. Musculoskeletal: Similar appearance of the permeative lytic metastatic lesion in the left iliac wing. No new aggressive lytic or blastic lesion of bone. Multilevel degenerative change of the spine. Demineralization of bone. IMPRESSION: 1. Increased size of retroperitoneal, iliac side chain and inguinal lymph nodes, concerning for progressive nodal metastatic disease. 2. Numerous bilateral solid pulmonary nodules are again seen, some of which have increased in size and others are stable in size overall compatible with disease progression. 3. Increased size of the subcapsular lesion in the anterior inferior right lobe of the liver, which has been stable over multiple prior examinations and was not FDG avid on prior PET-CT. Suggest attention on follow-up imaging. 4. 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 5. Questionable wall thickening of matted loops of small bowel in the left lower quadrant, suggest attention on follow-up imaging. 6. Small volume abdominopelvic free fluid. 7.  Aortic Atherosclerosis (ICD10-I70.0). Electronically Signed   By: Maudry Mayhew M.D.   On: 11/28/2023 15:20   DG Bone Density Result Date: 11/19/2023 EXAM: DUAL X-RAY ABSORPTIOMETRY (DXA) FOR BONE MINERAL DENSITY IMPRESSION: Your patient Jacqueline Yoder completed a BMD test on 11/19/2023 using the [employer] DXA System (software version: 14.10) manufactured by [employer]. The following summarizes the results of our evaluation. Technologist:VLM PATIENT BIOGRAPHICAL: Name: Jacqueline Yoder, Jacqueline Yoder Patient ID: [id number] Birth Date: [birth year] Height: 64.0 in. Gender: Female Exam Date: 11/19/2023 Weight:  171.2 lbs. Indications: Chemo, Colon CA, Hysterectomy Fractures: Treatments: Vitamin D DENSITOMETRY RESULTS: Site      Region        Measured Date Measured Age WHO Classification Young Adult T-score BMD         %Change vs. Previous Significant Change (*) AP Spine L1-L4 (L2,L3) 11/19/2023 76.6 Normal 0.2 1.203 g/cm2 - - DualFemur Total Right 11/19/2023 76.6 Normal -0.5 0.946 g/cm2 - - ASSESSMENT: The BMD measured at Femur Total Right is 0.946 g/cm2 with a T-score of -0.5. This patient is considered normal according to World Health Organization [facility]) criteria. L-2 & L-3 were excluded due to degenerative changes. The scan quality is good. World Science writer [facility]) criteria for post-menopausal, Caucasian Women: Normal:                   T-score at or above -1 SD Osteopenia/low bone mass: T-score between -1 and -2.5 SD Osteoporosis:             T-score at or below -2.5 SD RECOMMENDATIONS: 1. All patients should optimize calcium and vitamin D intake. 2. Consider FDA-approved medical therapies in postmenopausal women and men aged 24 years and older, based  on the following: a. A hip or vertebral(clinical or morphometric) fracture b. T-score < -2.5 at the femoral neck or spine after appropriate evaluation to exclude secondary causes c. Low bone mass (T-score between -1.0 and -2.5 at the femoral neck or spine) and a 10-year probability of a hip fracture > 3% or a 10-year probability of a major osteoporosis-related fracture > 20% based on the [place]-adapted WHO algorithm 3. Clinician judgment and/or patient preferences may indicate treatment for people with 10-year fracture probabilities above or below these levels FOLLOW-UP: People with diagnosed cases of osteoporosis or at high risk for fracture should have regular bone mineral density tests. For patients eligible for Medicare, routine testing is allowed once every 2 years. The testing frequency can be increased to one year for patients who have rapidly progressing  disease, those who are receiving or discontinuing medical therapy to restore bone mass, or have additional risk factors. I have reviewed this report, and agree with the above findings. [facility] Electronically Signed   By: Frederico Hamman M.D.   On: 11/19/2023 10:00

## 2024-01-17 NOTE — Assessment & Plan Note (Signed)
 S/p skin nodule biopsy confirmed metastatic adenocarcinoma consistent with colorectal origin.  Refer to wound care

## 2024-01-17 NOTE — Progress Notes (Signed)
 Radiation Oncology Follow up Note  Name: Jacqueline Yoder   Date:   01/17/2024 MRN:  [id number] DOB: 08/04/1946    This 78 y.o. female presents to the clinic today for reevaluation after PET CT scan and patient with known stage IV adenocarcinoma the rectum with significant pain in her right shoulder.  REFERRING PROVIDER: Saralyn Pilar *  HPI: Patient is a 78 year old female previously treated back in 21 for stage IIIc adenocarcinoma the rectum.  She is gone on to develop widespread metastatic disease.  She saw me about 2 weeks ago complaining significant pain in her right shoulder she also had biopsy-proven metastasis to her skin in her right inguinal region.  I ordered a PET CT scan which showed.  Progressive disease in the left supraclavicular region pulmonary metastatic disease progressive abdominal pelvic adenopathy and progressive multifocal skeletal involvement.  She had large area of hypermetabolic activity in the right humeral head which I will be targeting for palliative radiation.  She also has metastatic disease in T11 L4 also showed focal involvement of subcutaneous tissue along the right inguinal fold.  She is not complaining of any significant back pain she is does state she has some slight burning sensation in her right inguinal region.  COMPLICATIONS OF TREATMENT: none  FOLLOW UP COMPLIANCE: keeps appointments   PHYSICAL EXAM:  BP 132/73 (BP Location: Left Arm, Patient Position: Sitting, Cuff Size: Normal)   Pulse 100   Temp (!) 96.8 F (36 C) (Tympanic)   Resp (!) 24   Wt 158 lb (71.7 kg)   BMI 27.12 kg/m  Patient has a sling on her right upper extremity.  Motor and sensory levels in the lower extremities are equal and symmetric.  Well-developed well-nourished patient in NAD. HEENT reveals PERLA, EOMI, discs not visualized.  Oral cavity is clear. No oral mucosal lesions are identified. Neck is clear without evidence of cervical or supraclavicular adenopathy. Lungs are  clear to A&P. Cardiac examination is essentially unremarkable with regular rate and rhythm without murmur rub or thrill. Abdomen is benign with no organomegaly or masses noted. Motor sensory and DTR levels are equal and symmetric in the upper and lower extremities. Cranial nerves II through XII are grossly intact. Proprioception is intact. No peripheral adenopathy or edema is identified. No motor or sensory levels are noted. Crude visual fields are within normal range.  RADIOLOGY RESULTS: PET CT scan reviewed compatible with above-stated findings  PLAN: At this time I have offered a short palliative course of radiation therapy to her right humeral head 20 Gray in 5 fractions.  Risks and benefits of treatment including possible slight skin reaction and fatigue were explained to the patient.  At this time I will defer any other areas of palliative treatment.  The area of skin involvement in the right inguinal area is focal and involves a large area of tissue which treated with radiation therapy would be difficult.  She is seeing Dr. Cathie Hoops today and I would recommend further systemic treatment for her progressive and widespread metastasis.  I have personally 7 ordered CT simulation for first thing next week.  I would like to take this opportunity to thank you for allowing me to participate in the care of your patient.Carmina Miller, MD

## 2024-01-17 NOTE — Assessment & Plan Note (Signed)
 History of stage IIIC Rectal cancer, s/p TNT, followed by 09/17/19 APR/posterior vaginectomy/TAH/BSO/VY-flap, pT4b pN0 with close vaginal margin 0.2 mm.  Uterus and ovaries negative for malignancy. palliative radiation to vaginal recurrence- 01/19/21 recurrence with lung metastasis.-Palliative -FOLFIRI plus bevacizumab.  Irinotecan was dropped in November 2022 due to side effects. Negative for UGT1A1*28 - radiographically stable, rise of CEA-July 2023 CT lung metastasis worse--> Dec 2023 PET showed progression in pelvic lymph nodes and bone lesions,--> 2nd line irinotecan +panitumumab-->CT March 2024 stable. --> June 2024 CT showed mild lung progression/Progressive left iliac bone metastasis, vulvar mass difficult for biopsy in office,  Tempus Liquid biopsy - no actionable variants --> 06/25/2023 CT showed progression of lung nodules [SLD increase 32mm] and iliac inguinal lymphadenopathy--> 07/23/23 switch to dose reduced FOLFOX --> Dec 2024 CT- stable disease. --> Feb 2025 disease progression  Labs are reviewed and discussed with patient   CEA nadir was in 490, progressively increases - 10,193  Finished cycle 1 longsurf [60mg  BID day 1-5 and day 8- 12 every 28-day  + Bevacizumab D1 proceed with D15 Bevacizumab Proceed with  cycle 2

## 2024-01-21 ENCOUNTER — Ambulatory Visit
Admission: RE | Admit: 2024-01-21 | Discharge: 2024-01-21 | Disposition: A | Discharge status: Home / Self Care | Source: Ambulatory Visit | Attending: Radiation Oncology | Admitting: Radiation Oncology

## 2024-01-21 ENCOUNTER — Ambulatory Visit
Admission: RE | Admit: 2024-01-21 | Discharge: 2024-01-21 | Disposition: A | Discharge status: Home / Self Care | Payer: 59 | Source: Ambulatory Visit | Attending: Family Medicine | Admitting: Family Medicine

## 2024-01-21 DIAGNOSIS — C792 Secondary malignant neoplasm of skin: Secondary | ICD-10-CM | POA: Diagnosis not present

## 2024-01-21 DIAGNOSIS — Z87891 Personal history of nicotine dependence: Secondary | ICD-10-CM | POA: Diagnosis not present

## 2024-01-21 DIAGNOSIS — Z5112 Encounter for antineoplastic immunotherapy: Secondary | ICD-10-CM | POA: Diagnosis not present

## 2024-01-21 DIAGNOSIS — Z86711 Personal history of pulmonary embolism: Secondary | ICD-10-CM | POA: Diagnosis not present

## 2024-01-21 DIAGNOSIS — E7800 Pure hypercholesterolemia, unspecified: Secondary | ICD-10-CM | POA: Diagnosis not present

## 2024-01-21 DIAGNOSIS — R188 Other ascites: Secondary | ICD-10-CM | POA: Diagnosis not present

## 2024-01-21 DIAGNOSIS — Z79899 Other long term (current) drug therapy: Secondary | ICD-10-CM | POA: Diagnosis not present

## 2024-01-21 DIAGNOSIS — Z510 Encounter for antineoplastic radiation therapy: Secondary | ICD-10-CM | POA: Diagnosis not present

## 2024-01-21 DIAGNOSIS — Z933 Colostomy status: Secondary | ICD-10-CM | POA: Diagnosis not present

## 2024-01-21 DIAGNOSIS — Z1231 Encounter for screening mammogram for malignant neoplasm of breast: Secondary | ICD-10-CM | POA: Insufficient documentation

## 2024-01-21 DIAGNOSIS — D6481 Anemia due to antineoplastic chemotherapy: Secondary | ICD-10-CM | POA: Diagnosis not present

## 2024-01-21 DIAGNOSIS — I10 Essential (primary) hypertension: Secondary | ICD-10-CM | POA: Diagnosis not present

## 2024-01-21 DIAGNOSIS — C7802 Secondary malignant neoplasm of left lung: Secondary | ICD-10-CM | POA: Diagnosis not present

## 2024-01-21 DIAGNOSIS — G629 Polyneuropathy, unspecified: Secondary | ICD-10-CM | POA: Diagnosis not present

## 2024-01-21 DIAGNOSIS — I700 Atherosclerosis of aorta: Secondary | ICD-10-CM | POA: Diagnosis not present

## 2024-01-21 DIAGNOSIS — T451X5A Adverse effect of antineoplastic and immunosuppressive drugs, initial encounter: Secondary | ICD-10-CM | POA: Diagnosis not present

## 2024-01-21 DIAGNOSIS — Z9221 Personal history of antineoplastic chemotherapy: Secondary | ICD-10-CM | POA: Diagnosis not present

## 2024-01-21 DIAGNOSIS — Z7901 Long term (current) use of anticoagulants: Secondary | ICD-10-CM | POA: Diagnosis not present

## 2024-01-21 DIAGNOSIS — C7801 Secondary malignant neoplasm of right lung: Secondary | ICD-10-CM | POA: Diagnosis not present

## 2024-01-21 DIAGNOSIS — C20 Malignant neoplasm of rectum: Secondary | ICD-10-CM | POA: Diagnosis not present

## 2024-01-21 DIAGNOSIS — C7951 Secondary malignant neoplasm of bone: Secondary | ICD-10-CM | POA: Diagnosis not present

## 2024-01-22 DIAGNOSIS — Z86711 Personal history of pulmonary embolism: Secondary | ICD-10-CM | POA: Diagnosis not present

## 2024-01-22 DIAGNOSIS — C7802 Secondary malignant neoplasm of left lung: Secondary | ICD-10-CM | POA: Diagnosis not present

## 2024-01-22 DIAGNOSIS — Z79899 Other long term (current) drug therapy: Secondary | ICD-10-CM | POA: Diagnosis not present

## 2024-01-22 DIAGNOSIS — C7951 Secondary malignant neoplasm of bone: Secondary | ICD-10-CM | POA: Diagnosis not present

## 2024-01-22 DIAGNOSIS — I700 Atherosclerosis of aorta: Secondary | ICD-10-CM | POA: Diagnosis not present

## 2024-01-22 DIAGNOSIS — Z87891 Personal history of nicotine dependence: Secondary | ICD-10-CM | POA: Diagnosis not present

## 2024-01-22 DIAGNOSIS — G629 Polyneuropathy, unspecified: Secondary | ICD-10-CM | POA: Diagnosis not present

## 2024-01-22 DIAGNOSIS — Z9221 Personal history of antineoplastic chemotherapy: Secondary | ICD-10-CM | POA: Diagnosis not present

## 2024-01-22 DIAGNOSIS — R188 Other ascites: Secondary | ICD-10-CM | POA: Diagnosis not present

## 2024-01-22 DIAGNOSIS — Z7901 Long term (current) use of anticoagulants: Secondary | ICD-10-CM | POA: Diagnosis not present

## 2024-01-22 DIAGNOSIS — C7801 Secondary malignant neoplasm of right lung: Secondary | ICD-10-CM | POA: Diagnosis not present

## 2024-01-22 DIAGNOSIS — D6481 Anemia due to antineoplastic chemotherapy: Secondary | ICD-10-CM | POA: Diagnosis not present

## 2024-01-22 DIAGNOSIS — E7800 Pure hypercholesterolemia, unspecified: Secondary | ICD-10-CM | POA: Diagnosis not present

## 2024-01-22 DIAGNOSIS — Z933 Colostomy status: Secondary | ICD-10-CM | POA: Diagnosis not present

## 2024-01-22 DIAGNOSIS — Z5112 Encounter for antineoplastic immunotherapy: Secondary | ICD-10-CM | POA: Diagnosis not present

## 2024-01-22 DIAGNOSIS — C20 Malignant neoplasm of rectum: Secondary | ICD-10-CM | POA: Diagnosis not present

## 2024-01-22 DIAGNOSIS — Z510 Encounter for antineoplastic radiation therapy: Secondary | ICD-10-CM | POA: Diagnosis not present

## 2024-01-22 DIAGNOSIS — C792 Secondary malignant neoplasm of skin: Secondary | ICD-10-CM | POA: Diagnosis not present

## 2024-01-22 DIAGNOSIS — T451X5A Adverse effect of antineoplastic and immunosuppressive drugs, initial encounter: Secondary | ICD-10-CM | POA: Diagnosis not present

## 2024-01-22 DIAGNOSIS — I10 Essential (primary) hypertension: Secondary | ICD-10-CM | POA: Diagnosis not present

## 2024-01-23 ENCOUNTER — Ambulatory Visit (INDEPENDENT_AMBULATORY_CARE_PROVIDER_SITE_OTHER): Admitting: Surgery

## 2024-01-23 ENCOUNTER — Encounter: Payer: Self-pay | Admitting: *Deleted

## 2024-01-23 ENCOUNTER — Encounter: Payer: Self-pay | Admitting: Surgery

## 2024-01-23 VITALS — BP 127/86 | HR 92 | Temp 98.2°F | Ht 64.0 in | Wt 157.0 lb

## 2024-01-23 DIAGNOSIS — C20 Malignant neoplasm of rectum: Secondary | ICD-10-CM

## 2024-01-23 NOTE — Patient Instructions (Signed)
 I will send over your information to [facility] for your ostomy supplies. They should be contacting you for additional information if needed. If you have any questions or concerns please don't hesitate to call the office at [phone].

## 2024-01-24 ENCOUNTER — Ambulatory Visit
Admission: RE | Admit: 2024-01-24 | Discharge: 2024-01-24 | Disposition: A | Discharge status: Home / Self Care | Source: Ambulatory Visit | Attending: Radiation Oncology | Admitting: Radiation Oncology

## 2024-01-24 ENCOUNTER — Ambulatory Visit

## 2024-01-24 ENCOUNTER — Ambulatory Visit: Admitting: Oncology

## 2024-01-24 ENCOUNTER — Other Ambulatory Visit

## 2024-01-24 DIAGNOSIS — C792 Secondary malignant neoplasm of skin: Secondary | ICD-10-CM | POA: Diagnosis not present

## 2024-01-24 DIAGNOSIS — Z933 Colostomy status: Secondary | ICD-10-CM | POA: Diagnosis not present

## 2024-01-24 DIAGNOSIS — C7951 Secondary malignant neoplasm of bone: Secondary | ICD-10-CM | POA: Diagnosis not present

## 2024-01-24 DIAGNOSIS — Z7901 Long term (current) use of anticoagulants: Secondary | ICD-10-CM | POA: Diagnosis not present

## 2024-01-24 DIAGNOSIS — Z5112 Encounter for antineoplastic immunotherapy: Secondary | ICD-10-CM | POA: Diagnosis not present

## 2024-01-24 DIAGNOSIS — C7802 Secondary malignant neoplasm of left lung: Secondary | ICD-10-CM | POA: Diagnosis not present

## 2024-01-24 DIAGNOSIS — E7800 Pure hypercholesterolemia, unspecified: Secondary | ICD-10-CM | POA: Diagnosis not present

## 2024-01-24 DIAGNOSIS — Z79899 Other long term (current) drug therapy: Secondary | ICD-10-CM | POA: Diagnosis not present

## 2024-01-24 DIAGNOSIS — D6481 Anemia due to antineoplastic chemotherapy: Secondary | ICD-10-CM | POA: Diagnosis not present

## 2024-01-24 DIAGNOSIS — Z87891 Personal history of nicotine dependence: Secondary | ICD-10-CM | POA: Diagnosis not present

## 2024-01-24 DIAGNOSIS — I700 Atherosclerosis of aorta: Secondary | ICD-10-CM | POA: Diagnosis not present

## 2024-01-24 DIAGNOSIS — C20 Malignant neoplasm of rectum: Secondary | ICD-10-CM | POA: Diagnosis not present

## 2024-01-24 DIAGNOSIS — R188 Other ascites: Secondary | ICD-10-CM | POA: Diagnosis not present

## 2024-01-24 DIAGNOSIS — T451X5A Adverse effect of antineoplastic and immunosuppressive drugs, initial encounter: Secondary | ICD-10-CM | POA: Diagnosis not present

## 2024-01-24 DIAGNOSIS — G629 Polyneuropathy, unspecified: Secondary | ICD-10-CM | POA: Diagnosis not present

## 2024-01-24 DIAGNOSIS — Z510 Encounter for antineoplastic radiation therapy: Secondary | ICD-10-CM | POA: Diagnosis not present

## 2024-01-24 DIAGNOSIS — C7801 Secondary malignant neoplasm of right lung: Secondary | ICD-10-CM | POA: Diagnosis not present

## 2024-01-24 DIAGNOSIS — I10 Essential (primary) hypertension: Secondary | ICD-10-CM | POA: Diagnosis not present

## 2024-01-24 DIAGNOSIS — Z86711 Personal history of pulmonary embolism: Secondary | ICD-10-CM | POA: Diagnosis not present

## 2024-01-24 DIAGNOSIS — Z9221 Personal history of antineoplastic chemotherapy: Secondary | ICD-10-CM | POA: Diagnosis not present

## 2024-01-25 NOTE — Progress Notes (Signed)
 Patient ID: Jacqueline Yoder, female   DOB: 02/16/1946, 78 y.o.   MRN: [id number]  HPI [name] is a 78 y.o. female Jacqueline Yoder is a very nice 78 year old female with history of advanced colon cancer status post chemotherapy and radiation followed by abdominoperineal resection 5 years ago or so.  She unfortunately had recurrence with multiple metastatic disease including lung metastases bone metastases as well as soft tissue metastasis in her skin pannus.  She endorses weakness.  She states that her ostomy is working but has some leakage.  The leakage is intermittent and seems to be worsening when the stool is liquid. She did have bilateral recent CT of the chest abdomen and pelvis 2 months ago showing increase in lymphadenopathy within the retroperitoneal area consistent with metastatic disease she also had multiple pulmonary metastases nodules.  She does have multiple liver lesions consistent with metastatic disease.  She does seems to have also MAtted loops of small bowel in the left lower quadrant and I question metastatic disease also intra-abdominal he. HPI  Past Medical History:  Diagnosis Date   Allergy    Arthritis    Blood clot in vein    Family history of colon cancer    GERD (gastroesophageal reflux disease)    Hypercholesteremia    Hypertension    Hypertension    Lower extremity edema    Personal history of chemotherapy    Rectal cancer (HCC) 12/2018   Urinary incontinence     Past Surgical History:  Procedure Laterality Date   ABDOMINAL HYSTERECTOMY     CHOLECYSTECTOMY  1971   COLONOSCOPY WITH PROPOFOL N/A 12/03/2018   Procedure: COLONOSCOPY WITH PROPOFOL;  Surgeon: Midge Minium, MD;  Location: [facility] ENDOSCOPY;  Service: Endoscopy;  Laterality: N/A;   COLONOSCOPY WITH PROPOFOL N/A 07/15/2020   Procedure: COLONOSCOPY WITH PROPOFOL;  Surgeon: Wyline Mood, MD;  Location: [facility] ENDOSCOPY;  Service: Gastroenterology;  Laterality: N/A;   FLEXIBLE SIGMOIDOSCOPY N/A 12/06/2018   Procedure:  FLEXIBLE SIGMOIDOSCOPY;  Surgeon: Wyline Mood, MD;  Location: [facility] ENDOSCOPY;  Service: Endoscopy;  Laterality: N/A;   LAPAROSCOPIC COLOSTOMY  01/06/2019   PORTACATH PLACEMENT N/A 04/03/2019   Procedure: INSERTION PORT-A-CATH;  Surgeon: Leafy Ro, MD;  Location: [facility] ORS;  Service: General;  Laterality: N/A;    Family History  Problem Relation Age of Onset   Colon cancer Brother 58       exposure to chemicals [place]   Hypertension Mother    Stroke Mother    Kidney failure Father    Breast cancer Neg Hx    Ovarian cancer Neg Hx     Social History Social History   Tobacco Use   Smoking status: Former    Current packs/day: 0.00    Types: Cigarettes    Quit date: 12/02/1977    Years since quitting: 46.1   Smokeless tobacco: Former  Building services engineer status: Never Used  Substance Use Topics   Alcohol use: Never   Drug use: Never    Allergies  Allergen Reactions   Sulfamethoxazole-Trimethoprim Other (See Comments)    Also said she had chills and pressure in nose.    Current Outpatient Medications  Medication Sig Dispense Refill   acetaminophen (TYLENOL) 650 MG CR tablet Take 650 mg by mouth every 8 (eight) hours as needed for pain.     Cholecalciferol (VITAMIN D3) 2000 units capsule Take 2,000 Units by mouth daily.     clindamycin (CLINDAGEL) 1 % gel Apply topically 2 (two)  times daily. 30 g 5   diclofenac sodium (VOLTAREN) 1 % GEL Apply 2 g topically 4 (four) times daily as needed (joint pain).  11   diphenoxylate-atropine (LOMOTIL) 2.5-0.025 MG tablet Take 1 tablet by mouth 4 (four) times daily as needed for diarrhea or loose stools. 120 tablet 0   ELIQUIS 2.5 MG TABS tablet TAKE 1 TABLET BY MOUTH TWICE  DAILY 200 tablet 2   fluticasone (FLONASE) 50 MCG/ACT nasal spray Place 1 spray into both nostrils daily as needed for allergies or rhinitis.     gentamicin cream (GARAMYCIN) 0.1 % Apply to affected toe once daily. (Patient not taking: Reported on 12/21/2023) 30 g 1    ketoconazole (NIZORAL) 2 % cream Apply 1 Application topically daily. For up to 2 weeks. May repeat if need. 30 g 2   lidocaine-prilocaine (EMLA) cream Apply 1 Application topically daily as needed. Apply small amount to port and cover with saran wrap 1-2 hours prior to port access 30 g 3   loratadine (CLARITIN) 10 MG tablet Take 10 mg by mouth daily.     magnesium chloride (SLOW-MAG) 64 MG TBEC SR tablet Take 1 tablet (64 mg total) by mouth 2 (two) times daily. 60 tablet 2   Multiple Vitamins-Minerals (ONE-A-DAY WOMENS 50 PLUS PO) Take 1 tablet by mouth daily.      nystatin (MYCOSTATIN/NYSTOP) powder APPLY 1 POWDER TOPICALLY THREE TIMES DAILY (Patient not taking: No sig reported) 45 g 0   ondansetron (ZOFRAN) 8 MG tablet Take 1 tablet (8 mg total) by mouth every 8 (eight) hours as needed for nausea or vomiting. (Patient not taking: Reported on 12/21/2023) 30 tablet 1   potassium chloride SA (KLOR-CON M) 20 MEQ tablet TAKE 1 TABLET BY MOUTH DAILY 100 tablet 2   prochlorperazine (COMPAZINE) 10 MG tablet Take 1 tablet (10 mg total) by mouth every 6 (six) hours as needed (NAUSEA). (Patient not taking: Reported on 01/17/2024) 30 tablet 1   simvastatin (ZOCOR) 40 MG tablet Take 1 tablet (40 mg total) by mouth at bedtime. 100 tablet 3   tiZANidine (ZANAFLEX) 4 MG tablet Take 0.5 tablets (2 mg total) by mouth every 8 (eight) hours as needed for muscle spasms. (Patient not taking: Reported on 12/27/2023) 10 tablet 0   triamcinolone cream (KENALOG) 0.5 % Apply 1 Application topically 2 (two) times daily. (Patient not taking: Reported on 01/17/2024) 90 g 1   triamterene-hydrochlorothiazide (DYAZIDE) 37.5-25 MG capsule Take 1 each (1 capsule total) by mouth daily. 90 capsule 1   trifluridine-tipiracil (LONSURF) 20-8.19 MG tablet Take 3 tablets (60 mg of trifluridine total) by mouth 2 (two) times daily. 1hr after AM & PM meals days 1-5, 8-12. Repeat every 28d. (Patient not taking: Reported on 01/17/2024) 60 tablet 1    zinc gluconate 50 MG tablet Take 50 mg by mouth daily.     No current facility-administered medications for this visit.   Facility-Administered Medications Ordered in Other Visits  Medication Dose Route Frequency Provider Last Rate Last Admin   heparin lock flush 100 UNIT/ML injection              Review of Systems Full ROS  was asked and was negative except for the information on the HPI  Physical Exam Blood pressure 127/86, pulse 92, temperature 98.2 F (36.8 C), temperature source Oral, height 5\' 4"  (1.626 m), weight 157 lb (71.2 kg), SpO2 100%. CONSTITUTIONAL: Chronically ill and debilitated.  She is in no acute distress. EYES: Pupils are equal, round, ,  Sclera are non-icteric. EARS, NOSE, MOUTH AND THROAT: The oropharynx is clear. The oral mucosa is pink and moist. Hearing is intact to voice. LYMPH NODES:  Lymph nodes in the neck are normal. RESPIRATORY:  Lungs are clear. There is normal respiratory effort, with equal breath sounds bilaterally, and without pathologic use of accessory muscles. CARDIOVASCULAR: Heart is regular without murmurs, gallops, or rubs. GI: The abdomen is  soft, nontender, and nondistended. There are no palpable masses. There is no hepatosplenomegaly. There is a colostomy w fecal material, no evidence of complications. GU: Rectal deferred.   MUSCULOSKELETAL: Normal muscle strength and tone. No cyanosis or edema.  She is wearing asling due to her UE fracture SKIN:  She has a right lower quadrant pannus that has soft tissue involvement by metastatic disease  NEUROLOGIC: Motor and sensation is grossly normal. Cranial nerves are grossly intact. PSYCH:  Oriented to person, place and time. Affect is normal.  Data Reviewed  I have personally reviewed the patient's imaging, laboratory findings and medical records.    Assessment/Plan 78 year old female with metastatic rectal cancer status postchemotherapy radiation now with multiple metastatic sites including  pathological fracture.  There is no evidence of bowel obstruction.  She does have some intermittent issues with leakage around the stoma device.  Discussed with her about potential use of Imodium to make sure the output is thick.  Also discussed with her evaluation by ostomy nurses so they can assess a better fit for her.  Likely she may need a convex appliance.  I have also provided significant supportive therapy and compassionate care.  Unfortunately there is not much from a surgical perspective he that can be done at this time.  They understand this.  Please note that I spent 45 minutes in this encounter including extensive review of medical records, personally reviewing imaging studies, coordinating her care and performing documentation  Sterling Big, MD FACS General Surgeon 01/25/2024, 10:01 AM

## 2024-01-28 ENCOUNTER — Other Ambulatory Visit: Payer: Self-pay

## 2024-01-28 ENCOUNTER — Ambulatory Visit
Admission: RE | Admit: 2024-01-28 | Discharge: 2024-01-28 | Disposition: A | Discharge status: Home / Self Care | Source: Ambulatory Visit | Attending: Radiation Oncology | Admitting: Radiation Oncology

## 2024-01-28 DIAGNOSIS — C7951 Secondary malignant neoplasm of bone: Secondary | ICD-10-CM | POA: Diagnosis not present

## 2024-01-28 DIAGNOSIS — C792 Secondary malignant neoplasm of skin: Secondary | ICD-10-CM | POA: Diagnosis not present

## 2024-01-28 DIAGNOSIS — E7800 Pure hypercholesterolemia, unspecified: Secondary | ICD-10-CM | POA: Diagnosis not present

## 2024-01-28 DIAGNOSIS — Z79899 Other long term (current) drug therapy: Secondary | ICD-10-CM | POA: Diagnosis not present

## 2024-01-28 DIAGNOSIS — Z9221 Personal history of antineoplastic chemotherapy: Secondary | ICD-10-CM | POA: Diagnosis not present

## 2024-01-28 DIAGNOSIS — C20 Malignant neoplasm of rectum: Secondary | ICD-10-CM | POA: Diagnosis not present

## 2024-01-28 DIAGNOSIS — Z933 Colostomy status: Secondary | ICD-10-CM | POA: Diagnosis not present

## 2024-01-28 DIAGNOSIS — I700 Atherosclerosis of aorta: Secondary | ICD-10-CM | POA: Diagnosis not present

## 2024-01-28 DIAGNOSIS — C7802 Secondary malignant neoplasm of left lung: Secondary | ICD-10-CM | POA: Diagnosis not present

## 2024-01-28 DIAGNOSIS — Z87891 Personal history of nicotine dependence: Secondary | ICD-10-CM | POA: Diagnosis not present

## 2024-01-28 DIAGNOSIS — R188 Other ascites: Secondary | ICD-10-CM | POA: Diagnosis not present

## 2024-01-28 DIAGNOSIS — Z86711 Personal history of pulmonary embolism: Secondary | ICD-10-CM | POA: Diagnosis not present

## 2024-01-28 DIAGNOSIS — Z7901 Long term (current) use of anticoagulants: Secondary | ICD-10-CM | POA: Diagnosis not present

## 2024-01-28 DIAGNOSIS — C7801 Secondary malignant neoplasm of right lung: Secondary | ICD-10-CM | POA: Diagnosis not present

## 2024-01-28 DIAGNOSIS — G629 Polyneuropathy, unspecified: Secondary | ICD-10-CM | POA: Diagnosis not present

## 2024-01-28 DIAGNOSIS — I10 Essential (primary) hypertension: Secondary | ICD-10-CM | POA: Diagnosis not present

## 2024-01-28 DIAGNOSIS — T451X5A Adverse effect of antineoplastic and immunosuppressive drugs, initial encounter: Secondary | ICD-10-CM | POA: Diagnosis not present

## 2024-01-28 DIAGNOSIS — Z5112 Encounter for antineoplastic immunotherapy: Secondary | ICD-10-CM | POA: Diagnosis not present

## 2024-01-28 DIAGNOSIS — D6481 Anemia due to antineoplastic chemotherapy: Secondary | ICD-10-CM | POA: Diagnosis not present

## 2024-01-28 DIAGNOSIS — Z510 Encounter for antineoplastic radiation therapy: Secondary | ICD-10-CM | POA: Diagnosis not present

## 2024-01-28 LAB — RAD ONC ARIA SESSION SUMMARY
Course Elapsed Days: 0
Plan Fractions Treated to Date: 1
Plan Prescribed Dose Per Fraction: 4 Gy
Plan Total Fractions Prescribed: 5
Plan Total Prescribed Dose: 20 Gy
Reference Point Dosage Given to Date: 4 Gy
Reference Point Session Dosage Given: 4 Gy
Session Number: 1

## 2024-01-29 ENCOUNTER — Ambulatory Visit
Admission: RE | Admit: 2024-01-29 | Discharge: 2024-01-29 | Disposition: A | Discharge status: Home / Self Care | Source: Ambulatory Visit | Attending: Radiation Oncology | Admitting: Radiation Oncology

## 2024-01-29 ENCOUNTER — Other Ambulatory Visit: Payer: Self-pay

## 2024-01-29 DIAGNOSIS — Z5112 Encounter for antineoplastic immunotherapy: Secondary | ICD-10-CM | POA: Diagnosis not present

## 2024-01-29 DIAGNOSIS — C7951 Secondary malignant neoplasm of bone: Secondary | ICD-10-CM | POA: Diagnosis not present

## 2024-01-29 DIAGNOSIS — C7801 Secondary malignant neoplasm of right lung: Secondary | ICD-10-CM | POA: Diagnosis not present

## 2024-01-29 DIAGNOSIS — Z9221 Personal history of antineoplastic chemotherapy: Secondary | ICD-10-CM | POA: Diagnosis not present

## 2024-01-29 DIAGNOSIS — Z7901 Long term (current) use of anticoagulants: Secondary | ICD-10-CM | POA: Diagnosis not present

## 2024-01-29 DIAGNOSIS — Z86711 Personal history of pulmonary embolism: Secondary | ICD-10-CM | POA: Diagnosis not present

## 2024-01-29 DIAGNOSIS — C7802 Secondary malignant neoplasm of left lung: Secondary | ICD-10-CM | POA: Diagnosis not present

## 2024-01-29 DIAGNOSIS — E7800 Pure hypercholesterolemia, unspecified: Secondary | ICD-10-CM | POA: Diagnosis not present

## 2024-01-29 DIAGNOSIS — G629 Polyneuropathy, unspecified: Secondary | ICD-10-CM | POA: Diagnosis not present

## 2024-01-29 DIAGNOSIS — Z79899 Other long term (current) drug therapy: Secondary | ICD-10-CM | POA: Diagnosis not present

## 2024-01-29 DIAGNOSIS — Z933 Colostomy status: Secondary | ICD-10-CM | POA: Diagnosis not present

## 2024-01-29 DIAGNOSIS — R188 Other ascites: Secondary | ICD-10-CM | POA: Diagnosis not present

## 2024-01-29 DIAGNOSIS — T451X5A Adverse effect of antineoplastic and immunosuppressive drugs, initial encounter: Secondary | ICD-10-CM | POA: Diagnosis not present

## 2024-01-29 DIAGNOSIS — C792 Secondary malignant neoplasm of skin: Secondary | ICD-10-CM | POA: Diagnosis not present

## 2024-01-29 DIAGNOSIS — I10 Essential (primary) hypertension: Secondary | ICD-10-CM | POA: Diagnosis not present

## 2024-01-29 DIAGNOSIS — I700 Atherosclerosis of aorta: Secondary | ICD-10-CM | POA: Diagnosis not present

## 2024-01-29 DIAGNOSIS — C20 Malignant neoplasm of rectum: Secondary | ICD-10-CM | POA: Diagnosis not present

## 2024-01-29 DIAGNOSIS — Z87891 Personal history of nicotine dependence: Secondary | ICD-10-CM | POA: Diagnosis not present

## 2024-01-29 DIAGNOSIS — Z510 Encounter for antineoplastic radiation therapy: Secondary | ICD-10-CM | POA: Diagnosis not present

## 2024-01-29 DIAGNOSIS — D6481 Anemia due to antineoplastic chemotherapy: Secondary | ICD-10-CM | POA: Diagnosis not present

## 2024-01-29 LAB — RAD ONC ARIA SESSION SUMMARY
Course Elapsed Days: 1
Plan Fractions Treated to Date: 2
Plan Prescribed Dose Per Fraction: 4 Gy
Plan Total Fractions Prescribed: 5
Plan Total Prescribed Dose: 20 Gy
Reference Point Dosage Given to Date: 8 Gy
Reference Point Session Dosage Given: 4 Gy
Session Number: 2

## 2024-01-30 ENCOUNTER — Other Ambulatory Visit: Payer: Self-pay | Admitting: Oncology

## 2024-01-30 ENCOUNTER — Other Ambulatory Visit: Payer: Self-pay

## 2024-01-30 ENCOUNTER — Other Ambulatory Visit (HOSPITAL_COMMUNITY): Payer: Self-pay

## 2024-01-30 ENCOUNTER — Ambulatory Visit
Admission: RE | Admit: 2024-01-30 | Discharge: 2024-01-30 | Disposition: A | Discharge status: Home / Self Care | Source: Ambulatory Visit | Attending: Radiation Oncology | Admitting: Radiation Oncology

## 2024-01-30 DIAGNOSIS — C7951 Secondary malignant neoplasm of bone: Secondary | ICD-10-CM | POA: Diagnosis not present

## 2024-01-30 DIAGNOSIS — D6481 Anemia due to antineoplastic chemotherapy: Secondary | ICD-10-CM | POA: Diagnosis not present

## 2024-01-30 DIAGNOSIS — C7802 Secondary malignant neoplasm of left lung: Secondary | ICD-10-CM | POA: Diagnosis not present

## 2024-01-30 DIAGNOSIS — C7801 Secondary malignant neoplasm of right lung: Secondary | ICD-10-CM | POA: Diagnosis not present

## 2024-01-30 DIAGNOSIS — I700 Atherosclerosis of aorta: Secondary | ICD-10-CM | POA: Diagnosis not present

## 2024-01-30 DIAGNOSIS — Z79899 Other long term (current) drug therapy: Secondary | ICD-10-CM | POA: Diagnosis not present

## 2024-01-30 DIAGNOSIS — R188 Other ascites: Secondary | ICD-10-CM | POA: Diagnosis not present

## 2024-01-30 DIAGNOSIS — T451X5A Adverse effect of antineoplastic and immunosuppressive drugs, initial encounter: Secondary | ICD-10-CM | POA: Diagnosis not present

## 2024-01-30 DIAGNOSIS — C20 Malignant neoplasm of rectum: Secondary | ICD-10-CM

## 2024-01-30 DIAGNOSIS — Z87891 Personal history of nicotine dependence: Secondary | ICD-10-CM | POA: Diagnosis not present

## 2024-01-30 DIAGNOSIS — Z933 Colostomy status: Secondary | ICD-10-CM | POA: Diagnosis not present

## 2024-01-30 DIAGNOSIS — E7800 Pure hypercholesterolemia, unspecified: Secondary | ICD-10-CM | POA: Diagnosis not present

## 2024-01-30 DIAGNOSIS — C799 Secondary malignant neoplasm of unspecified site: Secondary | ICD-10-CM

## 2024-01-30 DIAGNOSIS — Z9221 Personal history of antineoplastic chemotherapy: Secondary | ICD-10-CM | POA: Diagnosis not present

## 2024-01-30 DIAGNOSIS — Z510 Encounter for antineoplastic radiation therapy: Secondary | ICD-10-CM | POA: Diagnosis not present

## 2024-01-30 DIAGNOSIS — C792 Secondary malignant neoplasm of skin: Secondary | ICD-10-CM | POA: Diagnosis not present

## 2024-01-30 DIAGNOSIS — Z7901 Long term (current) use of anticoagulants: Secondary | ICD-10-CM | POA: Diagnosis not present

## 2024-01-30 DIAGNOSIS — Z86711 Personal history of pulmonary embolism: Secondary | ICD-10-CM | POA: Diagnosis not present

## 2024-01-30 DIAGNOSIS — Z5112 Encounter for antineoplastic immunotherapy: Secondary | ICD-10-CM | POA: Diagnosis not present

## 2024-01-30 DIAGNOSIS — I10 Essential (primary) hypertension: Secondary | ICD-10-CM | POA: Diagnosis not present

## 2024-01-30 DIAGNOSIS — G629 Polyneuropathy, unspecified: Secondary | ICD-10-CM | POA: Diagnosis not present

## 2024-01-30 LAB — RAD ONC ARIA SESSION SUMMARY
Course Elapsed Days: 2
Plan Fractions Treated to Date: 3
Plan Prescribed Dose Per Fraction: 4 Gy
Plan Total Fractions Prescribed: 5
Plan Total Prescribed Dose: 20 Gy
Reference Point Dosage Given to Date: 12 Gy
Reference Point Session Dosage Given: 4 Gy
Session Number: 3

## 2024-01-30 NOTE — Progress Notes (Signed)
 Specialty Pharmacy Refill Coordination Note  Jacqueline Yoder is a 78 y.o. female contacted today regarding refills of specialty medication(s) Lonsurf.  Patient requested (Patient-Rptd) Delivery   Delivery date: (Patient-Rptd) 02/12/24   Verified address: (Patient-Rptd) [address] [place], [place] [address]   Medication will be filled on 02/11/24.   This fill date is pending response to refill request from provider. Patient is aware and if they have not received fill by intended date, they must follow up with pharmacy.

## 2024-01-31 ENCOUNTER — Inpatient Hospital Stay (HOSPITAL_BASED_OUTPATIENT_CLINIC_OR_DEPARTMENT_OTHER): Admitting: Oncology

## 2024-01-31 ENCOUNTER — Encounter: Payer: Self-pay | Admitting: Oncology

## 2024-01-31 ENCOUNTER — Inpatient Hospital Stay

## 2024-01-31 ENCOUNTER — Other Ambulatory Visit: Payer: Self-pay

## 2024-01-31 ENCOUNTER — Ambulatory Visit
Admission: RE | Admit: 2024-01-31 | Discharge: 2024-01-31 | Disposition: A | Discharge status: Home / Self Care | Source: Ambulatory Visit | Attending: Radiation Oncology | Admitting: Radiation Oncology

## 2024-01-31 ENCOUNTER — Other Ambulatory Visit (HOSPITAL_COMMUNITY): Payer: Self-pay

## 2024-01-31 VITALS — BP 117/72 | HR 97 | Temp 97.3°F | Resp 18 | Wt 157.0 lb

## 2024-01-31 VITALS — BP 131/57 | HR 89

## 2024-01-31 DIAGNOSIS — K521 Toxic gastroenteritis and colitis: Secondary | ICD-10-CM | POA: Diagnosis not present

## 2024-01-31 DIAGNOSIS — Z87891 Personal history of nicotine dependence: Secondary | ICD-10-CM | POA: Diagnosis not present

## 2024-01-31 DIAGNOSIS — Z86711 Personal history of pulmonary embolism: Secondary | ICD-10-CM | POA: Diagnosis not present

## 2024-01-31 DIAGNOSIS — E7800 Pure hypercholesterolemia, unspecified: Secondary | ICD-10-CM | POA: Diagnosis not present

## 2024-01-31 DIAGNOSIS — S42294D Other nondisplaced fracture of upper end of right humerus, subsequent encounter for fracture with routine healing: Secondary | ICD-10-CM

## 2024-01-31 DIAGNOSIS — C20 Malignant neoplasm of rectum: Secondary | ICD-10-CM

## 2024-01-31 DIAGNOSIS — Z510 Encounter for antineoplastic radiation therapy: Secondary | ICD-10-CM | POA: Diagnosis not present

## 2024-01-31 DIAGNOSIS — I890 Lymphedema, not elsewhere classified: Secondary | ICD-10-CM | POA: Diagnosis not present

## 2024-01-31 DIAGNOSIS — C7802 Secondary malignant neoplasm of left lung: Secondary | ICD-10-CM | POA: Diagnosis not present

## 2024-01-31 DIAGNOSIS — Z933 Colostomy status: Secondary | ICD-10-CM | POA: Diagnosis not present

## 2024-01-31 DIAGNOSIS — G629 Polyneuropathy, unspecified: Secondary | ICD-10-CM | POA: Diagnosis not present

## 2024-01-31 DIAGNOSIS — C799 Secondary malignant neoplasm of unspecified site: Secondary | ICD-10-CM

## 2024-01-31 DIAGNOSIS — G620 Drug-induced polyneuropathy: Secondary | ICD-10-CM

## 2024-01-31 DIAGNOSIS — C792 Secondary malignant neoplasm of skin: Secondary | ICD-10-CM

## 2024-01-31 DIAGNOSIS — Z9221 Personal history of antineoplastic chemotherapy: Secondary | ICD-10-CM | POA: Diagnosis not present

## 2024-01-31 DIAGNOSIS — Z5111 Encounter for antineoplastic chemotherapy: Secondary | ICD-10-CM

## 2024-01-31 DIAGNOSIS — T451X5A Adverse effect of antineoplastic and immunosuppressive drugs, initial encounter: Secondary | ICD-10-CM | POA: Diagnosis not present

## 2024-01-31 DIAGNOSIS — C7801 Secondary malignant neoplasm of right lung: Secondary | ICD-10-CM | POA: Diagnosis not present

## 2024-01-31 DIAGNOSIS — D6481 Anemia due to antineoplastic chemotherapy: Secondary | ICD-10-CM

## 2024-01-31 DIAGNOSIS — Z7901 Long term (current) use of anticoagulants: Secondary | ICD-10-CM | POA: Diagnosis not present

## 2024-01-31 DIAGNOSIS — I700 Atherosclerosis of aorta: Secondary | ICD-10-CM | POA: Diagnosis not present

## 2024-01-31 DIAGNOSIS — Z5112 Encounter for antineoplastic immunotherapy: Secondary | ICD-10-CM | POA: Diagnosis not present

## 2024-01-31 DIAGNOSIS — C7951 Secondary malignant neoplasm of bone: Secondary | ICD-10-CM | POA: Diagnosis not present

## 2024-01-31 DIAGNOSIS — R188 Other ascites: Secondary | ICD-10-CM | POA: Diagnosis not present

## 2024-01-31 DIAGNOSIS — Z79899 Other long term (current) drug therapy: Secondary | ICD-10-CM | POA: Diagnosis not present

## 2024-01-31 DIAGNOSIS — I10 Essential (primary) hypertension: Secondary | ICD-10-CM | POA: Diagnosis not present

## 2024-01-31 LAB — CMP (CANCER CENTER ONLY)
ALT: 18 U/L (ref 0–44)
AST: 31 U/L (ref 15–41)
Albumin: 3.7 g/dL (ref 3.5–5.0)
Alkaline Phosphatase: 76 U/L (ref 38–126)
Anion gap: 9 (ref 5–15)
BUN: 16 mg/dL (ref 8–23)
CO2: 26 mmol/L (ref 22–32)
Calcium: 9.3 mg/dL (ref 8.9–10.3)
Chloride: 101 mmol/L (ref 98–111)
Creatinine: 0.8 mg/dL (ref 0.44–1.00)
GFR, Estimated: >60 mL/min (ref >60)
Glucose, Bld: 113 mg/dL — ABNORMAL HIGH (ref 70–99)
Potassium: 3.9 mmol/L (ref 3.5–5.1)
Sodium: 136 mmol/L (ref 135–145)
Total Bilirubin: 0.5 mg/dL (ref 0.0–1.2)
Total Protein: 7.1 g/dL (ref 6.5–8.1)

## 2024-01-31 LAB — CBC WITH DIFFERENTIAL (CANCER CENTER ONLY)
Abs Immature Granulocytes: 0.01 10*3/uL (ref 0.00–0.07)
Basophils Absolute: 0 10*3/uL (ref 0.0–0.1)
Basophils Relative: 0 %
Eosinophils Absolute: 0.1 10*3/uL (ref 0.0–0.5)
Eosinophils Relative: 2 %
HCT: 32.8 % — ABNORMAL LOW (ref 36.0–46.0)
Hemoglobin: 10.5 g/dL — ABNORMAL LOW (ref 12.0–15.0)
Immature Granulocytes: 0 %
Lymphocytes Relative: 31 %
Lymphs Abs: 1 10*3/uL (ref 0.7–4.0)
MCH: 30.9 pg (ref 26.0–34.0)
MCHC: 32 g/dL (ref 30.0–36.0)
MCV: 96.5 fL (ref 80.0–100.0)
Monocytes Absolute: 0.2 10*3/uL (ref 0.1–1.0)
Monocytes Relative: 7 %
Neutro Abs: 1.8 10*3/uL (ref 1.7–7.7)
Neutrophils Relative %: 60 %
Platelet Count: 190 10*3/uL (ref 150–400)
RBC: 3.4 MIL/uL — ABNORMAL LOW (ref 3.87–5.11)
RDW: 16.6 % — ABNORMAL HIGH (ref 11.5–15.5)
WBC Count: 3 10*3/uL — ABNORMAL LOW (ref 4.0–10.5)
nRBC: 0 % (ref 0.0–0.2)

## 2024-01-31 LAB — RAD ONC ARIA SESSION SUMMARY
Course Elapsed Days: 3
Plan Fractions Treated to Date: 4
Plan Prescribed Dose Per Fraction: 4 Gy
Plan Total Fractions Prescribed: 5
Plan Total Prescribed Dose: 20 Gy
Reference Point Dosage Given to Date: 16 Gy
Reference Point Session Dosage Given: 4 Gy
Session Number: 4

## 2024-01-31 MED ORDER — SODIUM CHLORIDE 0.9% FLUSH
10.0000 mL | INTRAVENOUS | Status: DC | PRN
  Administered 2024-01-31: 10 mL
  Filled 2024-01-31: qty 10

## 2024-01-31 MED ORDER — SODIUM CHLORIDE 0.9 % IV SOLN
5.0000 mg/kg | Freq: Once | INTRAVENOUS | Status: AC
  Administered 2024-01-31: 400 mg via INTRAVENOUS
  Filled 2024-01-31: qty 16

## 2024-01-31 MED ORDER — HEPARIN SOD (PORK) LOCK FLUSH 100 UNIT/ML IV SOLN
500.0000 [IU] | Freq: Once | INTRAVENOUS | Status: AC | PRN
  Administered 2024-01-31: 500 [IU]
  Filled 2024-01-31: qty 5

## 2024-01-31 MED ORDER — LONSURF 20-8.19 MG PO TABS
35.0000 mg/m2 | ORAL_TABLET | Freq: Two times a day (BID) | ORAL | 1 refills | Status: DC
Start: 2024-01-31 — End: 2024-04-03
  Filled 2024-01-31 (×2): qty 60, 28d supply, fill #0
  Filled 2024-03-03: qty 60, 28d supply, fill #1

## 2024-01-31 MED ORDER — SODIUM CHLORIDE 0.9 % IV SOLN
INTRAVENOUS | Status: DC
  Filled 2024-01-31: qty 250

## 2024-01-31 NOTE — Assessment & Plan Note (Signed)
 Chemotherapy plan as listed above

## 2024-01-31 NOTE — Assessment & Plan Note (Signed)
 S/p skin nodule biopsy confirmed metastatic adenocarcinoma consistent with colorectal origin.  Refer to wound care

## 2024-01-31 NOTE — Assessment & Plan Note (Signed)
 Stable symptoms, Grade 1-2.patient copes well.

## 2024-01-31 NOTE — Assessment & Plan Note (Signed)
 History of PE and bilateral lower extremity DVT  05/18/23 Repeat right LE US showed chronic non occlusive DVT Continue  Eliquis 2.5 mg twice daily for anticoagulation prophylaxis. Recommend leg elevation and compression stocking.

## 2024-01-31 NOTE — Assessment & Plan Note (Signed)
 Pathological fracture S/p palliative radiation

## 2024-01-31 NOTE — Progress Notes (Signed)
 Ok to round to next vial size 400mg 

## 2024-01-31 NOTE — Progress Notes (Signed)
 Hematology/Oncology Progress note [phone] [phone]      CHIEF COMPLAINTS/REASON FOR VISIT:  Follow up for rectal cancer  ASSESSMENT & PLAN:   Cancer Staging  Rectal cancer [facility]) Staging form: Colon and Rectum, AJCC 8th Edition - Pathologic stage from 10/06/2019: Stage IIC (ypT4b, pN0, cM0) - Signed by [name], MD on 10/06/2019 - Clinical stage from 06/30/2020: [name] - Signed by [name], MD on 04/17/2022 - Pathologic: Stage Unknown (rpTX, pNX, cM1) - Signed by [name], MD on 01/31/2021   Rectal cancer [facility]) History of stage IIIC Rectal cancer, s/p TNT, followed by 09/17/19 APR/posterior vaginectomy/TAH/BSO/VY-flap, pT4b pN0 with close vaginal margin 0.2 mm.  Uterus and ovaries negative for malignancy. palliative radiation to vaginal recurrence- 01/19/21 recurrence with lung metastasis.-Palliative -FOLFIRI plus bevacizumab.  Irinotecan was dropped in November 2022 due to side effects. Negative for UGT1A1*28 - radiographically stable, rise of CEA-July 2023 CT lung metastasis worse--> Dec 2023 PET showed progression in pelvic lymph nodes and bone lesions,--> 2nd line irinotecan +panitumumab-->CT March 2024 stable. --> June 2024 CT showed mild lung progression/Progressive left iliac bone metastasis, vulvar mass difficult for biopsy in office,  Tempus Liquid biopsy - no actionable variants --> 06/25/2023 CT showed progression of lung nodules [SLD increase 21mm] and iliac inguinal lymphadenopathy--> 07/23/23 switch to dose reduced FOLFOX --> Dec 2024 CT- stable disease. --> Feb 2025 disease progression--> longsurf + Bevacizumab  Labs are reviewed and discussed with patient   CEA nadir was in 490, progressively increases - 10,193  Currently on longsurf [60mg  BID day 1-5 and day 8- 12 every 28-day  + Bevacizumab D1 proceed with D15 Bevacizumab Proceed with  cycle 2  D15 Bevacizumab  Anemia due to antineoplastic chemotherapy Hb stable, observation  Chemotherapy  induced diarrhea continue lomotil  QID PRN as instructed.  She has not needed to take antidiarrhea.   Chemotherapy-induced neuropathy (HCC) Stable symptoms, Grade 1-2.patient copes well.   Encounter for antineoplastic chemotherapy Chemotherapy plan as listed above.   History of pulmonary embolism History of PE and bilateral lower extremity DVT  05/18/23 Repeat right LE US showed chronic non occlusive DVT Continue  Eliquis 2.5 mg twice daily for anticoagulation prophylaxis. Recommend leg elevation and compression stocking.   Metastasis to skin [facility]) S/p skin nodule biopsy confirmed metastatic adenocarcinoma consistent with colorectal origin.  Refer to wound care  Proximal humerus fracture Pathological fracture S/p palliative radiation  Follow up 2 week[s].  All questions were answered. The patient knows to call the clinic with any problems, questions or concerns.  [name], MD, PhD [facility] Hematology Oncology 01/31/2024      HISTORY OF PRESENTING ILLNESS:  Oncology History  Rectal cancer (HCC)  01/23/2019 Initial Diagnosis   Rectal cancer [facility]) Patient initially presented with complaints of postmenopausal bleeding on 08/16/2018.  History of was menopausal vaginal bleeding in 2016 which resulted in cervical polypectomy.  Pathology 02/04/2015 showed cervical polyp, consistent with benign endometrial polyp.  [name] after polypectomy due to anxiety associated with pelvic exams.  pelvic exam on 08/16/2018 reviewed cervical abnormality and from enlarged uterus. Seen by Dr. [name] on 10/29/2018.  Endometrial biopsy and a Pap smear was performed. 10/29/2018 Pap smear showed adenocarcinoma, favor endometrial origin. 10/29/2018 endometrial biopsy showed endometrioid carcinoma, FIGO grade 1.  10/29/2018- TA & TV Ultrasound revealed: Anteverted uterus measuring 8.7 x 5.6 x 6.4 cm without evidence of focal masses.  The endometrium measuring 24.1 mm (thickened) and heterogeneous.   Right and left  ovaries not visualized.  No adnexal masses identified.  No free fluid in cul-de-sac.  Patient was seen by Dr. [name] in clinic on 11/13/2018.  Cervical exam reveals 2 cm exophytic irregular mass consistent with malignancy.    11/19/2018 CT chest abdomen pelvis with contrast showed thickened endometrium with some irregularity compatible with the provided diagnosis of endometrial malignancy.  There is a mildly prominent left inguinal node 1.4 cm.  Patient was seen by Dr. [name] on 11/20/2018 and left groin lymph node biopsy was recommended.  11/26/2018 patient underwent left inguinal lymph node biopsy. Pathology showed metastatic adenocarcinoma consistent with colorectal origin.  CDX 2+.  Case was discussed on tumor board.  Recommend colonoscopy for further evaluation.  Patient reports significant weight loss 30 pounds over the last year.  Chronic vaginal spotting. Change of bowel habits the past few months.  More constipated.  Family history positive for brother who has colon cancer prostate cancer.  patient has underwent colonoscopy on 12/03/2018 which reviewed a nonobstructing large mass in the rectum.  Also chronic fistula.  Mass was not circumferential.  This was biopsied with a cold forceps for histology.  Pathology came back hyperplastic polyp negative for dysplasia and malignancy. [name], patient underwent flex sigmoidoscopy on 12/06/2018 with rebiopsy of the rectal mass. This time biopsy results came back positive for invasive colorectal adenocarcinoma, moderately differentiated. Immunotherapy for nearly mismatch repair protein (MMR ) was performed.  There is no loss of MMR expression.  low probability of MSI high.   # Seen by [name] for evaluation of resectability for rectal cancer. In addition, she also had a second opinion with [facility] where her endometrial biopsy pathology was changed to  adenocarcinoma, consistent with  colorectal primary.   [name]. She has home health that has been assisting with ostomy care  Patient was also evaluated by [facility] oncology.  Recommendation is to proceed with TNT with concurrent chemoradiation followed by neoadjuvant chemotherapy followed by surgical resection. Patient prefers to have treatment done locally with [facility].   # Oncology Treatment:  02/03/2019- 03/19/2019  concurrent Xeloda and radiation.  Xeloda dose 825mg  /m2 BID - rounded to 1650mg  BID- on days of radiation. 04/09/2019, started on FOLFOX with bolus early.  Omitted.  07/16/2019 finished 8 cycles of FOLFOX. 09/17/19 APR/posterior vaginectomy/TAH/BSO/VY-flap pT4b pN0 with close vaginal margin 0.2 mm.  Uterus and ovaries negative for malignancy.   10/06/2019 Cancer Staging   Staging form: Colon and Rectum, AJCC 8th Edition - Pathologic stage from 10/06/2019: Stage IIC (ypT4b, pN0, cM0) - Signed by [name], MD on 10/06/2019   06/30/2020 Cancer Staging   Staging form: Colon and Rectum, AJCC 8th Edition - Clinical stage from 06/30/2020: [name] - Signed by [name], MD on 04/17/2022 Stage prefix: Recurrence Total positive nodes: 1   06/30/2020 Relapse/Recurrence   vaginal introitus mass biopsied. Pathology is consistent with metastatic colorectal adenocarcinoma    07/20/2020 - 09/13/2020 Chemotherapy   Concurrent chemotherapy with Xeloda with Radiation.  08/02/2020-08/16/2020, . Xeloda was held due to neutropenia    08/17/2020 - 09/06/2020 Radiation Therapy   Xeloda 1500 mg twice daily concurrently with radiation    01/31/2021 Cancer Staging   Staging form: Colon and Rectum, AJCC 8th Edition - Pathologic: Stage Unknown (rpTX, pNX, cM1) - Signed by [name], MD on 01/31/2021 Stage prefix: Recurrence   01/31/2021 - 08/20/2021 Chemotherapy   FOLFIRI + Bev  Irinotecan dose was reduced, eventually 100mg /m2  Irinotecan was dropped in November 2022 due to side effects. Negative for UGT1A1*28     09/13/2021 -  Chemotherapy   maintenance 5-FU/bevacizumab    11/28/2021 -  Chemotherapy   5-FU/Irinotecan/bevacizumab. Irinotecan 100 mg/m2 was added back due to progressively increasing CEA.    07/14/2022 Imaging   Bone scan  1. No evidence of skeletal metastatic disease in the hips. 2. Asymmetric increased activity in the RIGHT humeral head is favored degenerative as described above. If pain in the RIGHT shoulder consider further evaluation with plain film or MRI   07/17/2022 Imaging   1. Stable scattered small bilateral pulmonary nodules, largest 8 by 6 mm in the right middle lobe. 2. Subtle chronically stable posterior cortical irregularity and heterogeneity in the left iliac bone, not appreciable on the bone scan of 07/14/2022. This has been present at least over the past year and could reflect radiation therapy related findings although strictly speaking, malignant involvement of the left iliac bone is difficult to exclude. If clinically warranted this could be further assessed with nuclear medicine PET-CT or MRI of the bony pelvis with and without contrast. 3. Pelvic floor laxity with loops of small bowel extending 3 cm below the pubococcygeal line. 4. Aortic and systemic atherosclerosis as detailed above. Aortic Atherosclerosis    12/26/2022 Imaging   CT chest abdomen pelvis w contrast 1. Stable scattered solid pulmonary nodules. 2. Right inguinal and right pelvic lymph nodes which were hypermetabolic on prior PET-CT are stable. 3. Osseous lesion of the proximal right humerus demonstrates increased sclerosis when compared with the prior PET-CT, possibly due to treatment response. 4. 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 5. Aortic Atherosclerosis     06/25/2023 Imaging   CT chest abdomen pelvis w contrast showed 1. Increased size of the solid bilateral pulmonary nodules,consistent with worsening metastatic disease. 2. Increased size of the right external iliac and  inguinal lymph nodes, concerning for worsening metastatic disease. 3. No significant interval change in the left iliac bone osseous metastasis. 4. Enlarged main pulmonary artery measuring 3.6 cm, which can be seen in the setting of pulmonary arterial hypertension. 5.  Aortic Atherosclerosis (ICD10-I70.0)     Miscellaneous   Tempus liquid biopsy showed no actionable variants.  NOTCH1 missense variant, BRCA2 missense variant , MYC missense variant   07/11/2023 Imaging   PET showed 1. Multifocal tracer avid abdominopelvic lymph nodes are identified compatible with metastatic disease. 2. Multifocal bilateral pulmonary nodules are identified compatible with pulmonary metastasis.  3. Diffuse increased radiotracer uptake throughout the marrow space is identified diminishing sensitivity for detecting osseous metastasis. No abnormal increased radiotracer uptake identified above background increased bone marrow activity to indicate metabolically active bone metastases. 4.  Aortic Atherosclerosis   07/23/2023 - 11/13/2023 Chemotherapy   switch to dose reduced FOLFOX    09/16/2023 Imaging   CT abdomen pelvis w contrast  1. Small-bowel obstruction with transition point in the anterior aspect of the lower abdomen, left of midline. This may be secondary to adhesions. 2. Small volume ascites. 3. Prior colon resection with left anterior abdominal wall colostomy. Adjacent parastomal hernia. Small volume fluid within the hernia sac. 4. Stable pulmonary metastatic disease. 5. Stable metastatic lesion of the left iliac wing. 6. Enlarged right external iliac chain lymph node measuring 1.4 cm, previously 1.3 cm. Several mildly prominent retroperitoneal lymph nodes have increased in size from prior but remain subcentimeter in size. 7. Aortic atherosclerosis (ICD10-I70.0).   09/16/2023 - 09/20/2023 Hospital Admission   Hospitalized due  to Small bowel obstruction due to adhesions    09/24/2023 Imaging   1.  Bilateral pulmonary metastases are all stable from 06/25/2023 chest CT. 2. No thoracic adenopathy. No new or progressive metastatic disease in the chest. 3. Trace bilateral posterior pleural effusions. 4. Two-vessel coronary atherosclerosis. 5. Dilated main pulmonary artery, suggesting pulmonary arterial hypertension. 6.  Aortic Atherosclerosis (ICD10-I70.0).   11/28/2023 Imaging   CT chest abdomen pelvis with contrast 1. Increased size of retroperitoneal, iliac side chain and inguinal lymph nodes, concerning for progressive nodal metastatic disease. 2. Numerous bilateral solid pulmonary nodules are again seen, some of which have increased in size and others are stable in size overall compatible with disease progression. 3. Increased size of the subcapsular lesion in the anterior inferior right lobe of the liver, which has been stable over multiple prior examinations and was not FDG avid on prior PET-CT. Suggest attention on follow-up imaging. 4. 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 5. Questionable wall thickening of matted loops of small bowel in the left lower quadrant, suggest attention on follow-up imaging. 6. Small volume abdominopelvic free fluid. 7.  Aortic Atherosclerosis (ICD10-I70.0).   12/13/2023 -  Chemotherapy   Patient is on Treatment Plan : COLORECTAL Bevacizumab + Trifluridine/Tipiracil q28d     01/01/2024 Procedure   Right inguinal crease skin nodule biopsy  Pathology showed dermal deposits of metastatic colon adenocarcinoma, extending to the edge and base.      01/14/2024 Imaging   PET scan showed  1. Progressive disease with progressive left supraclavicular adenopathy, progressive pulmonary metastatic disease, progressive abdominal and pelvic adenopathy, and progressive multifocal skeletal involvement. 2. Substantial multifocal skeletal involvement including the right proximal humerus, right medial clavicle,  left T11 vertebral body, L4 posterior elements, and entire left iliac crest. The permeative lesion of the right humerus may have some extraosseous extension. Similarly there is cortical breakthrough in the large left iliac crest lesion. 3. Hypermetabolic irregular tumor extends along the right inguinal ring region and right hip adductor musculature as well as in the subcutaneous and cutaneous tissues along the right inguinal fold, compatible with tumor involvement. 4. Right eccentric vulvar activity just below the clitoris has a maximum SUV of 7.5, and given the regional soft tissue metastatic disease, a metastatic focus in this vicinity is not excluded. 5. The subcapsular lesion along the anterior inferior right hepatic lobe described on CT of 11/28/2023 is not discernibly hypermetabolic or visible on today's PET-CT. 6. Descending left colostomy with mild diverticulosis. 7.  Aortic Atherosclerosis (ICD10-I70.0).    Metastatic adenocarcinoma (HCC)  12/13/2023 -  Chemotherapy   Patient is on Treatment Plan : COLORECTAL Bevacizumab + Trifluridine/Tipiracil q28d         INTERVAL HISTORY JOLIN BENAVIDES is a 78 y.o. female who has above history reviewed by me presents for follow-up of rectal cancer. She was accompanied by her daughter. + manageable neuropathy symptoms of her fingertips, intermittent,  worse when exposed to cold temperature.  + chronic loose ostomy output, stable, not worse.  + Right humerous fracture, pain is constrolled. S/p palliative RT Weight is stable.   Review of Systems  Constitutional:  Positive for fatigue. Negative for appetite change, chills, fever and unexpected weight change.  HENT:   Negative for hearing loss and voice change.   Eyes:  Negative for eye problems.  Respiratory:  Negative for chest tightness and cough.   Cardiovascular:  Negative for chest pain.  Gastrointestinal:  Positive for diarrhea.  Negative for abdominal distention, abdominal pain,  blood in stool, constipation and nausea.  Endocrine: Negative for hot flashes.  Genitourinary:  Negative for difficulty urinating and frequency.   Musculoskeletal:  Positive for arthralgias.        right shoulder pain.   Skin:  Positive for rash. Negative for itching.  Neurological:  Positive for numbness. Negative for extremity weakness.  Hematological:  Negative for adenopathy.  Psychiatric/Behavioral:  Negative for confusion.     MEDICAL HISTORY:  Past Medical History:  Diagnosis Date   Allergy    Arthritis    Blood clot in vein    Family history of colon cancer    GERD (gastroesophageal reflux disease)    Hypercholesteremia    Hypertension    Hypertension    Lower extremity edema    Personal history of chemotherapy    Rectal cancer (HCC) 12/2018   Urinary incontinence     SURGICAL HISTORY: Past Surgical History:  Procedure Laterality Date   ABDOMINAL HYSTERECTOMY     CHOLECYSTECTOMY  1971   COLONOSCOPY WITH PROPOFOL N/A 12/03/2018   Procedure: COLONOSCOPY WITH PROPOFOL;  Surgeon: Midge Minium, MD;  Location: [facility] ENDOSCOPY;  Service: Endoscopy;  Laterality: N/A;   COLONOSCOPY WITH PROPOFOL N/A 07/15/2020   Procedure: COLONOSCOPY WITH PROPOFOL;  Surgeon: Wyline Mood, MD;  Location: [facility] ENDOSCOPY;  Service: Gastroenterology;  Laterality: N/A;   FLEXIBLE SIGMOIDOSCOPY N/A 12/06/2018   Procedure: FLEXIBLE SIGMOIDOSCOPY;  Surgeon: Wyline Mood, MD;  Location: [facility] ENDOSCOPY;  Service: Endoscopy;  Laterality: N/A;   LAPAROSCOPIC COLOSTOMY  01/06/2019   PORTACATH PLACEMENT N/A 04/03/2019   Procedure: INSERTION PORT-A-CATH;  Surgeon: Leafy Ro, MD;  Location: [facility] ORS;  Service: General;  Laterality: N/A;    SOCIAL HISTORY: Social History   Socioeconomic History   Marital status: Widowed    Spouse name: Not on file   Number of children: Not on file   Years of education: Not on file   Highest education level: Some college, no degree  Occupational History   Not on  file  Tobacco Use   Smoking status: Former    Current packs/day: 0.00    Types: Cigarettes    Quit date: 12/02/1977    Years since quitting: 46.1   Smokeless tobacco: Former  Building services engineer status: Never Used  Substance and Sexual Activity   Alcohol use: Never   Drug use: Never   Sexual activity: Not Currently    Birth control/protection: None  Other Topics Concern   Not on file  Social History Narrative   Lives with daughter   Social Drivers of Health   Financial Resource Strain: Low Risk  (11/15/2023)   Overall Financial Resource Strain (CARDIA)    Difficulty of Paying Living Expenses: Not hard at all  Food Insecurity: No Food Insecurity (11/15/2023)   Hunger Vital Sign    Worried About Running Out of Food in the Last Year: Never true    Ran Out of Food in the Last Year: Never true  Transportation Needs: No Transportation Needs (11/15/2023)   PRAPARE - Administrator, Civil Service (Medical): No    Lack of Transportation (Non-Medical): No  Physical Activity: Insufficiently Active (11/15/2023)   Exercise Vital Sign    Days of Exercise per Week: 3 days    Minutes of Exercise per Session: 10 min  Stress: No Stress Concern Present (11/15/2023)   [employer] of Occupational Health - Occupational Stress Questionnaire    Feeling of Stress :  Not at all  Social Connections: Moderately Integrated (11/15/2023)   Social Connection and Isolation Panel [NHANES]    Frequency of Communication with Friends and Family: More than three times a week    Frequency of Social Gatherings with Friends and Family: More than three times a week    Attends Religious Services: More than 4 times per year    Active Member of [employer] or Organizations: Yes    Attends Banker Meetings: More than 4 times per year    Marital Status: Widowed  Intimate Partner Violence: Not At Risk (09/27/2023)   Humiliation, Afraid, Rape, and Kick questionnaire    Fear of Current or Ex-Partner:  No    Emotionally Abused: No    Physically Abused: No    Sexually Abused: No    FAMILY HISTORY: Family History  Problem Relation Age of Onset   Colon cancer Brother 60       exposure to chemicals [place]   Hypertension Mother    Stroke Mother    Kidney failure Father    Breast cancer Neg Hx    Ovarian cancer Neg Hx     ALLERGIES:  is allergic to sulfamethoxazole-trimethoprim.  MEDICATIONS:  Current Outpatient Medications  Medication Sig Dispense Refill   acetaminophen (TYLENOL) 650 MG CR tablet Take 650 mg by mouth every 8 (eight) hours as needed for pain.     Cholecalciferol (VITAMIN D3) 2000 units capsule Take 2,000 Units by mouth daily.     clindamycin (CLINDAGEL) 1 % gel Apply topically 2 (two) times daily. 30 g 5   diclofenac sodium (VOLTAREN) 1 % GEL Apply 2 g topically 4 (four) times daily as needed (joint pain).  11   diphenoxylate-atropine (LOMOTIL) 2.5-0.025 MG tablet Take 1 tablet by mouth 4 (four) times daily as needed for diarrhea or loose stools. 120 tablet 0   ELIQUIS 2.5 MG TABS tablet TAKE 1 TABLET BY MOUTH TWICE  DAILY 200 tablet 2   fluticasone (FLONASE) 50 MCG/ACT nasal spray Place 1 spray into both nostrils daily as needed for allergies or rhinitis.     gentamicin cream (GARAMYCIN) 0.1 % Apply to affected toe once daily. 30 g 1   ketoconazole (NIZORAL) 2 % cream Apply 1 Application topically daily. For up to 2 weeks. May repeat if need. 30 g 2   lidocaine-prilocaine (EMLA) cream Apply 1 Application topically daily as needed. Apply small amount to port and cover with saran wrap 1-2 hours prior to port access 30 g 3   loratadine (CLARITIN) 10 MG tablet Take 10 mg by mouth daily.     magnesium chloride (SLOW-MAG) 64 MG TBEC SR tablet Take 1 tablet (64 mg total) by mouth 2 (two) times daily. 60 tablet 2   Multiple Vitamins-Minerals (ONE-A-DAY WOMENS 50 PLUS PO) Take 1 tablet by mouth daily.      potassium chloride SA (KLOR-CON M) 20 MEQ tablet TAKE 1 TABLET BY  MOUTH DAILY 100 tablet 2   simvastatin (ZOCOR) 40 MG tablet Take 1 tablet (40 mg total) by mouth at bedtime. 100 tablet 3   triamterene-hydrochlorothiazide (DYAZIDE) 37.5-25 MG capsule Take 1 each (1 capsule total) by mouth daily. 90 capsule 1   zinc gluconate 50 MG tablet Take 50 mg by mouth daily.     nystatin (MYCOSTATIN/NYSTOP) powder APPLY 1 POWDER TOPICALLY THREE TIMES DAILY (Patient not taking: No sig reported) 45 g 0   ondansetron (ZOFRAN) 8 MG tablet Take 1 tablet (8 mg total) by mouth every 8 (  eight) hours as needed for nausea or vomiting. (Patient not taking: Reported on 01/31/2024) 30 tablet 1   prochlorperazine (COMPAZINE) 10 MG tablet Take 1 tablet (10 mg total) by mouth every 6 (six) hours as needed (NAUSEA). (Patient not taking: Reported on 01/31/2024) 30 tablet 1   tiZANidine (ZANAFLEX) 4 MG tablet Take 0.5 tablets (2 mg total) by mouth every 8 (eight) hours as needed for muscle spasms. (Patient not taking: Reported on 01/31/2024) 10 tablet 0   triamcinolone cream (KENALOG) 0.5 % Apply 1 Application topically 2 (two) times daily. (Patient not taking: Reported on 01/31/2024) 90 g 1   trifluridine-tipiracil (LONSURF) 20-8.19 MG tablet Take 3 tablets (60 mg of trifluridine total) by mouth 2 (two) times daily. 1hr after AM & PM meals days 1-5, 8-12. Repeat every 28d. 60 tablet 1   No current facility-administered medications for this visit.   Facility-Administered Medications Ordered in Other Visits  Medication Dose Route Frequency Provider Last Rate Last Admin   0.9 %  sodium chloride infusion   Intravenous Continuous [name], MD 10 mL/hr at 01/31/24 0924 New Bag at 01/31/24 0924   bevacizumab-awwb (MVASI) 400 mg in sodium chloride 0.9 % 100 mL chemo infusion  5 mg/kg (Treatment Plan Recorded) Intravenous Once [name], MD       heparin lock flush 100 UNIT/ML injection            heparin lock flush 100 unit/mL  500 Units Intracatheter Once PRN [name], MD       sodium chloride flush  (NS) 0.9 % injection 10 mL  10 mL Intracatheter PRN [name], MD         PHYSICAL EXAMINATION: ECOG PERFORMANCE STATUS: 1 - Symptomatic but completely ambulatory  Physical Exam Constitutional:      General: She is not in acute distress. HENT:     Head: Normocephalic and atraumatic.  Eyes:     General: No scleral icterus. Cardiovascular:     Rate and Rhythm: Normal rate.     Heart sounds: Normal heart sounds.  Pulmonary:     Effort: Pulmonary effort is normal. No respiratory distress.     Breath sounds: No wheezing.  Abdominal:     General: Bowel sounds are normal. There is no distension.     Palpations: Abdomen is soft.     Comments: + Colostomy bag   Musculoskeletal:        General: No deformity. Normal range of motion.     Cervical back: Normal range of motion and neck supple.  Skin:    General: Skin is warm and dry.  Neurological:     Mental Status: She is alert and oriented to person, place, and time. Mental status is at baseline.       LABORATORY DATA:  I have reviewed the data as listed    Latest Ref Rng & Units 01/31/2024    8:38 AM 01/17/2024   10:42 AM 01/10/2024    8:27 AM  CBC  WBC 4.0 - 10.5 K/uL 3.0  5.2  3.0   Hemoglobin 12.0 - 15.0 g/dL 28.4  13.2  44.0   Hematocrit 36.0 - 46.0 % 32.8  33.5  33.1   Platelets 150 - 400 K/uL 190  309  252       Latest Ref Rng & Units 01/31/2024    8:38 AM 01/17/2024   10:42 AM 01/10/2024    8:27 AM  CMP  Glucose 70 - 99 mg/dL 102  725  115   BUN 8 - 23 mg/dL 16  18  17    Creatinine 0.44 - 1.00 mg/dL 5.40  9.81  1.91   Sodium 135 - 145 mmol/L 136  136  138   Potassium 3.5 - 5.1 mmol/L 3.9  3.7  3.9   Chloride 98 - 111 mmol/L 101  103  100   CO2 22 - 32 mmol/L 26  26  27    Calcium 8.9 - 10.3 mg/dL 9.3  9.1  9.3   Total Protein 6.5 - 8.1 g/dL 7.1  7.1  7.4   Total Bilirubin 0.0 - 1.2 mg/dL 0.5  0.3  0.6   Alkaline Phos 38 - 126 U/L 76  81  86   AST 15 - 41 U/L 31  40  33   ALT 0 - 44 U/L 18  23  18        RADIOGRAPHIC STUDIES: I have personally reviewed the radiological images as listed and agreed with the findings in the report. MM 3D SCREENING MAMMOGRAM BILATERAL BREAST Result Date: 01/25/2024 CLINICAL DATA:  Screening. EXAM: DIGITAL SCREENING BILATERAL MAMMOGRAM WITH TOMOSYNTHESIS AND CAD TECHNIQUE: Bilateral screening digital craniocaudal and mediolateral oblique mammograms were obtained. Bilateral screening digital breast tomosynthesis was performed. The images were evaluated with computer-aided detection. COMPARISON:  Previous exam(s). ACR Breast Density Category b: There are scattered areas of fibroglandular density. FINDINGS: There are no findings suspicious for malignancy. IMPRESSION: No mammographic evidence of malignancy. A result letter of this screening mammogram will be mailed directly to the patient. RECOMMENDATION: Screening mammogram in one year. (Code:[id number]-B-01Y) BI-RADS CATEGORY  1: Negative. Electronically Signed   By: Baird Lyons M.D.   On: 01/25/2024 07:45   NM PET Image Restag (PS) Skull Base To Thigh Result Date: 01/17/2024 CLINICAL DATA:  Subsequent treatment strategy for rectal cancer. EXAM: NUCLEAR MEDICINE PET SKULL BASE TO THIGH TECHNIQUE: 8.5 mCi F-18 FDG was injected intravenously. Full-ring PET imaging was performed from the skull base to thigh after the radiotracer. CT data was obtained and used for attenuation correction and anatomic localization. Fasting blood glucose: 81 mg/dl COMPARISON:  47/82/9562, and CT scan of 11/28/2023 FINDINGS: Mediastinal blood pool activity: SUV max 2.1 Liver activity: SUV max NA NECK: No significant abnormal hypermetabolic activity in this region. Incidental CT findings: None. CHEST: Compared to prior PET-CT there is progressive left supraclavicular adenopathy measuring up to 1.2 cm in short axis with maximum SUV of 13.4 (formerly 4.9). Numerous scattered pulmonary nodules throughout both lungs, index lesion in the right middle lobe measuring 1.6  by 1.4 cm on image 68 series 6 with maximum SUV 11.5 (previously 4.3). Many of the nodules are new compared to the prior PET-CT, these are roughly similar in distribution to the CT chest from 11/28/2023. Incidental small lesion in the adipose tissues just above the left scapula, likely a lymph node, measuring 4 mm in short axis on image 35 series 6, maximum SUV 3.3. Incidental CT findings: Right Port-A-Cath tip: Right atrium. ABDOMEN/PELVIS: The subcapsular lesion along the anterior inferior right hepatic lobe described on CT of 11/28/2023 is not discernibly hypermetabolic or visible on today's PET-CT. Abnormal hypermetabolic retrocrural and retroperitoneal adenopathy along with hypermetabolic right common iliac, bilateral external iliac, and bilateral inguinal adenopathy. Index left periaortic lymph node on image 95 series 6 measures 1.4 cm in short axis with maximum SUV 12.8. Similar size on CT from 11/28/2023, back on 07/09/2023 this node measured 0.7 cm with maximum SUV 4.2. Hypermetabolic irregular tumor extends along the  right inguinal ring region and right hip adductor musculature as well as in the subcutaneous and cutaneous tissues along the right inguinal fold, compatible with tumor involvement. The multifocal cutaneous involvement has a maximum SUV of 10.1. Right eccentric vulvar activity just below the clitoris has a maximum SUV of 7.5, and given the regional soft tissue metastatic disease, a metastatic focus in this vicinity is not excluded. Incidental CT findings: Benign renal cysts. No further imaging workup of these lesions is indicated. Atherosclerosis is present, including aortoiliac atherosclerotic disease. Descending left colostomy noted with mild diverticulosis. SKELETON: Substantial multifocal skeletal involvement including the right proximal humerus, right medial clavicle, left T11 vertebral body, L4 posterior elements, and entire left iliac crest. The permeative lesion of the right humerus may  have some extraosseous extension, maximum SUV 16.0. Similarly there is cortical breakthrough in the large left iliac crest lesion which has a maximum SUV of 14.2. Incidental CT findings: None. IMPRESSION: 1. Progressive disease with progressive left supraclavicular adenopathy, progressive pulmonary metastatic disease, progressive abdominal and pelvic adenopathy, and progressive multifocal skeletal involvement. 2. Substantial multifocal skeletal involvement including the right proximal humerus, right medial clavicle, left T11 vertebral body, L4 posterior elements, and entire left iliac crest. The permeative lesion of the right humerus may have some extraosseous extension. Similarly there is cortical breakthrough in the large left iliac crest lesion. 3. Hypermetabolic irregular tumor extends along the right inguinal ring region and right hip adductor musculature as well as in the subcutaneous and cutaneous tissues along the right inguinal fold, compatible with tumor involvement. 4. Right eccentric vulvar activity just below the clitoris has a maximum SUV of 7.5, and given the regional soft tissue metastatic disease, a metastatic focus in this vicinity is not excluded. 5. The subcapsular lesion along the anterior inferior right hepatic lobe described on CT of 11/28/2023 is not discernibly hypermetabolic or visible on today's PET-CT. 6. Descending left colostomy with mild diverticulosis. 7.  Aortic Atherosclerosis (ICD10-I70.0). Electronically Signed   By: Gaylyn Rong M.D.   On: 01/17/2024 09:48   DG Shoulder Right Result Date: 12/27/2023 CLINICAL DATA:  Chronic right shoulder pain without known injury. EXAM: RIGHT SHOULDER - 2+ VIEW COMPARISON:  None Available. FINDINGS: Possible nondisplaced fracture involving greater tuberosity. No dislocation is noted. Moderate degenerative changes are seen involving the right glenohumeral and acromioclavicular joints. Possible Hill-Sachs deformity suggesting prior  dislocations. IMPRESSION: Possible nondisplaced fracture involving greater tuberosity of proximal humerus. Degenerative changes as noted above. Electronically Signed   By: Lupita Raider M.D.   On: 12/27/2023 09:51   VAS [place] LOWER EXTREMITY VENOUS REFLUX Result Date: 12/21/2023  Lower Venous Reflux Study Patient Name:  MARQUESA RATH  Date of Exam:   12/20/2023 Medical Rec #: [id number]       Accession #:    [id number] Date of Birth: 1946-08-14       Patient Gender: F Patient Age:   60 years Exam Location:  [facility] Procedure:      VAS [place] LOWER EXTREMITY VENOUS REFLUX Referring Phys: Sheppard Plumber --------------------------------------------------------------------------------  Indications: Pain, Swelling, Edema, and rt knee.  Performing Technologist: Salvadore Farber RVT  Examination Guidelines: A complete evaluation includes B-mode imaging, spectral Doppler, color Doppler, and power Doppler as needed of all accessible portions of each vessel. Bilateral testing is considered an integral part of a complete examination. Limited examinations for reoccurring indications may be performed as noted. The reflux portion of the exam is performed with the patient in reverse Trendelenburg.  Significant venous reflux is defined as >500 ms in the superficial venous system, and >1 second in the deep venous system.   Summary: Right: - No evidence of deep vein thrombosis seen in the right lower extremity, from the common femoral through the popliteal veins. - No evidence of superficial venous thrombosis in the right lower extremity. - There is no evidence of venous reflux seen in the right lower extremity. - No evidence of superficial venous reflux seen in the right greater saphenous vein. - No evidence of superficial venous reflux seen in the right short saphenous vein.  *See table(s) above for measurements and observations. Electronically signed by Devon Fogo MD on 12/21/2023 at 8:33:40 AM.    Final    CT CHEST  ABDOMEN PELVIS W CONTRAST Result Date: 11/28/2023 CLINICAL DATA:  Rectal cancer, follow-up.  * Tracking Code: BO * EXAM: CT CHEST, ABDOMEN, AND PELVIS WITH CONTRAST TECHNIQUE: Multidetector CT imaging of the chest, abdomen and pelvis was performed following the standard protocol during bolus administration of intravenous contrast. RADIATION DOSE REDUCTION: This exam was performed according to the departmental dose-optimization program which includes automated exposure control, adjustment of the mA and/or kV according to patient size and/or use of iterative reconstruction technique. CONTRAST:  85mL OMNIPAQUE IOHEXOL 300 MG/ML  SOLN COMPARISON:  Multiple priors including CT September 24, 2023 FINDINGS: CT CHEST FINDINGS Cardiovascular: Right chest Port-A-Cath with tip in the right atrium. Thoracic aortic atherosclerosis. Dilated main pulmonary artery. Coronary artery calcifications. Normal size heart. Mediastinum/Nodes: Prominent supraclavicular lymph nodes measure 9 mm on image 6/2 previously 7 mm. No pathologically enlarged mediastinal, hilar or axillary lymph nodes. No suspicious thyroid nodule.  Tiny hiatal hernia. Lungs/Pleura: Numerous bilateral solid pulmonary nodules are again seen. Some of which have increased in size and others are stable in size. For reference:: -medial right middle lobe pulmonary nodule measures 18 x 16 mm on image 95/3 previously 16 x 14 mm. -anterior left lower lobe pulmonary nodule measures 7 mm on image 111/3, unchanged. -left upper lobe pulmonary nodule measures 9 mm on image 65/3 previously 7 mm. Musculoskeletal: No aggressive lytic or blastic lesion of bone. Multilevel degenerative change of the spine. CT ABDOMEN PELVIS FINDINGS Hepatobiliary: Increased size of the subcapsular lesion in the anterior inferior right lobe of the liver now measuring 10 mm on image 69/2 previously 8 mm. Gallbladder is surgically absent. No biliary ductal dilation. Pancreas: No pancreatic ductal dilation  or evidence of acute inflammation. Spleen: No splenomegaly.  Stable splenic lymphangioma/cysts. Adrenals/Urinary Tract: No suspicious adrenal nodule. Stable bilateral renal cysts. No hydronephrosis. Urinary bladder is unremarkable for degree of distension. Stomach/Bowel: Radiopaque enteric contrast material traverses the transverse colon. Stomach is unremarkable for degree of distension. No pathologic dilation of small or large bowel. Questionable wall thickening of matted loops of small bowel in the left lower quadrant on image 89/2. Prior partial colectomy and rectal resection with left anterior abdominal wall colostomy. Fluid and soft tissue stranding in the mesorectal/presacral space without discrete enhancing soft tissue nodularity. Vascular/Lymphatic: Aortic atherosclerosis. Increased size of retroperitoneal, iliac side chain and inguinal lymph nodes. For reference: Left periaortic lymph node measures 12 mm in short axis on image 66/2 previously 9 mm. -right external iliac lymph node measuring 18 mm in short axis on image 98/2 previously measured 14 mm -Right pelvic sidewall lymph node measures 10 mm in short axis on image 93/2 previously 7 mm. Reproductive: Uterus is surgically absent. Other: Small volume abdominopelvic free fluid. Postsurgical change in the abdominal  wall. Musculoskeletal: Similar appearance of the permeative lytic metastatic lesion in the left iliac wing. No new aggressive lytic or blastic lesion of bone. Multilevel degenerative change of the spine. Demineralization of bone. IMPRESSION: 1. Increased size of retroperitoneal, iliac side chain and inguinal lymph nodes, concerning for progressive nodal metastatic disease. 2. Numerous bilateral solid pulmonary nodules are again seen, some of which have increased in size and others are stable in size overall compatible with disease progression. 3. Increased size of the subcapsular lesion in the anterior inferior right lobe of the liver, which has  been stable over multiple prior examinations and was not FDG avid on prior PET-CT. Suggest attention on follow-up imaging. 4. 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 5. Questionable wall thickening of matted loops of small bowel in the left lower quadrant, suggest attention on follow-up imaging. 6. Small volume abdominopelvic free fluid. 7.  Aortic Atherosclerosis (ICD10-I70.0). Electronically Signed   By: Maudry Mayhew M.D.   On: 11/28/2023 15:20   DG Bone Density Result Date: 11/19/2023 EXAM: DUAL X-RAY ABSORPTIOMETRY (DXA) FOR BONE MINERAL DENSITY IMPRESSION: Your patient Jacqueline Yoder completed a BMD test on 11/19/2023 using the [employer] DXA System (software version: 14.10) manufactured by [employer]. The following summarizes the results of our evaluation. Technologist:VLM PATIENT BIOGRAPHICAL: Name: Fransisca, Shawn Patient ID: [id number] Birth Date: 25-Jul-1946 Height: 64.0 in. Gender: Female Exam Date: 11/19/2023 Weight: 171.2 lbs. Indications: Chemo, Colon CA, Hysterectomy Fractures: Treatments: Vitamin D DENSITOMETRY RESULTS: Site      Region        Measured Date Measured Age WHO Classification Young Adult T-score BMD         %Change vs. Previous Significant Change (*) AP Spine L1-L4 (L2,L3) 11/19/2023 76.6 Normal 0.2 1.203 g/cm2 - - DualFemur Total Right 11/19/2023 76.6 Normal -0.5 0.946 g/cm2 - - ASSESSMENT: The BMD measured at Femur Total Right is 0.946 g/cm2 with a T-score of -0.5. This patient is considered normal according to World Health Organization [facility]) criteria. L-2 & L-3 were excluded due to degenerative changes. The scan quality is good. World Science writer [facility]) criteria for post-menopausal, Caucasian Women: Normal:                   T-score at or above -1 SD Osteopenia/low bone mass: T-score between -1 and -2.5 SD Osteoporosis:             T-score at or below -2.5 SD RECOMMENDATIONS: 1. All patients should optimize calcium and  vitamin D intake. 2. Consider FDA-approved medical therapies in postmenopausal women and men aged 42 years and older, based on the following: a. A hip or vertebral(clinical or morphometric) fracture b. T-score < -2.5 at the femoral neck or spine after appropriate evaluation to exclude secondary causes c. Low bone mass (T-score between -1.0 and -2.5 at the femoral neck or spine) and a 10-year probability of a hip fracture > 3% or a 10-year probability of a major osteoporosis-related fracture > 20% based on the [place]-adapted WHO algorithm 3. Clinician judgment and/or patient preferences may indicate treatment for people with 10-year fracture probabilities above or below these levels FOLLOW-UP: People with diagnosed cases of osteoporosis or at high risk for fracture should have regular bone mineral density tests. For patients eligible for Medicare, routine testing is allowed once every 2 years. The testing frequency can be increased to one year for patients who have rapidly progressing disease, those who are receiving or discontinuing  medical therapy to restore bone mass, or have additional risk factors. I have reviewed this report, and agree with the above findings. [facility] Electronically Signed   By: Alinda Apley M.D.   On: 11/19/2023 10:00      MM 3D SCREENING MAMMOGRAM BILATERAL BREAST Result Date: 01/25/2024 CLINICAL DATA:  Screening. EXAM: DIGITAL SCREENING BILATERAL MAMMOGRAM WITH TOMOSYNTHESIS AND CAD TECHNIQUE: Bilateral screening digital craniocaudal and mediolateral oblique mammograms were obtained. Bilateral screening digital breast tomosynthesis was performed. The images were evaluated with computer-aided detection. COMPARISON:  Previous exam(s). ACR Breast Density Category b: There are scattered areas of fibroglandular density. FINDINGS: There are no findings suspicious for malignancy. IMPRESSION: No mammographic evidence of malignancy. A result letter of this screening  mammogram will be mailed directly to the patient. RECOMMENDATION: Screening mammogram in one year. (Code:[id number]-B-01Y) BI-RADS CATEGORY  1: Negative. Electronically Signed   By: Dina  Arceo M.D.   On: 01/25/2024 07:45   NM PET Image Restag (PS) Skull Base To Thigh Result Date: 01/17/2024 CLINICAL DATA:  Subsequent treatment strategy for rectal cancer. EXAM: NUCLEAR MEDICINE PET SKULL BASE TO THIGH TECHNIQUE: 8.5 mCi F-18 FDG was injected intravenously. Full-ring PET imaging was performed from the skull base to thigh after the radiotracer. CT data was obtained and used for attenuation correction and anatomic localization. Fasting blood glucose: 81 mg/dl COMPARISON:  03/47/4259, and CT scan of 11/28/2023 FINDINGS: Mediastinal blood pool activity: SUV max 2.1 Liver activity: SUV max NA NECK: No significant abnormal hypermetabolic activity in this region. Incidental CT findings: None. CHEST: Compared to prior PET-CT there is progressive left supraclavicular adenopathy measuring up to 1.2 cm in short axis with maximum SUV of 13.4 (formerly 4.9). Numerous scattered pulmonary nodules throughout both lungs, index lesion in the right middle lobe measuring 1.6 by 1.4 cm on image 68 series 6 with maximum SUV 11.5 (previously 4.3). Many of the nodules are new compared to the prior PET-CT, these are roughly similar in distribution to the CT chest from 11/28/2023. Incidental small lesion in the adipose tissues just above the left scapula, likely a lymph node, measuring 4 mm in short axis on image 35 series 6, maximum SUV 3.3. Incidental CT findings: Right Port-A-Cath tip: Right atrium. ABDOMEN/PELVIS: The subcapsular lesion along the anterior inferior right hepatic lobe described on CT of 11/28/2023 is not discernibly hypermetabolic or visible on today's PET-CT. Abnormal hypermetabolic retrocrural and retroperitoneal adenopathy along with hypermetabolic right common iliac, bilateral external iliac, and bilateral inguinal  adenopathy. Index left periaortic lymph node on image 95 series 6 measures 1.4 cm in short axis with maximum SUV 12.8. Similar size on CT from 11/28/2023, back on 07/09/2023 this node measured 0.7 cm with maximum SUV 4.2. Hypermetabolic irregular tumor extends along the right inguinal ring region and right hip adductor musculature as well as in the subcutaneous and cutaneous tissues along the right inguinal fold, compatible with tumor involvement. The multifocal cutaneous involvement has a maximum SUV of 10.1. Right eccentric vulvar activity just below the clitoris has a maximum SUV of 7.5, and given the regional soft tissue metastatic disease, a metastatic focus in this vicinity is not excluded. Incidental CT findings: Benign renal cysts. No further imaging workup of these lesions is indicated. Atherosclerosis is present, including aortoiliac atherosclerotic disease. Descending left colostomy noted with mild diverticulosis. SKELETON: Substantial multifocal skeletal involvement including the right proximal humerus, right medial clavicle, left T11 vertebral body, L4 posterior elements, and entire left iliac crest. The permeative lesion of the right  humerus may have some extraosseous extension, maximum SUV 16.0. Similarly there is cortical breakthrough in the large left iliac crest lesion which has a maximum SUV of 14.2. Incidental CT findings: None. IMPRESSION: 1. Progressive disease with progressive left supraclavicular adenopathy, progressive pulmonary metastatic disease, progressive abdominal and pelvic adenopathy, and progressive multifocal skeletal involvement. 2. Substantial multifocal skeletal involvement including the right proximal humerus, right medial clavicle, left T11 vertebral body, L4 posterior elements, and entire left iliac crest. The permeative lesion of the right humerus may have some extraosseous extension. Similarly there is cortical breakthrough in the large left iliac crest lesion. 3.  Hypermetabolic irregular tumor extends along the right inguinal ring region and right hip adductor musculature as well as in the subcutaneous and cutaneous tissues along the right inguinal fold, compatible with tumor involvement. 4. Right eccentric vulvar activity just below the clitoris has a maximum SUV of 7.5, and given the regional soft tissue metastatic disease, a metastatic focus in this vicinity is not excluded. 5. The subcapsular lesion along the anterior inferior right hepatic lobe described on CT of 11/28/2023 is not discernibly hypermetabolic or visible on today's PET-CT. 6. Descending left colostomy with mild diverticulosis. 7.  Aortic Atherosclerosis (ICD10-I70.0). Electronically Signed   By: Gaylyn Rong M.D.   On: 01/17/2024 09:48   DG Shoulder Right Result Date: 12/27/2023 CLINICAL DATA:  Chronic right shoulder pain without known injury. EXAM: RIGHT SHOULDER - 2+ VIEW COMPARISON:  None Available. FINDINGS: Possible nondisplaced fracture involving greater tuberosity. No dislocation is noted. Moderate degenerative changes are seen involving the right glenohumeral and acromioclavicular joints. Possible Hill-Sachs deformity suggesting prior dislocations. IMPRESSION: Possible nondisplaced fracture involving greater tuberosity of proximal humerus. Degenerative changes as noted above. Electronically Signed   By: Lupita Raider M.D.   On: 12/27/2023 09:51   VAS [place] LOWER EXTREMITY VENOUS REFLUX Result Date: 12/21/2023  Lower Venous Reflux Study Patient Name:  Jacqueline Yoder  Date of Exam:   12/20/2023 Medical Rec #: [id number]       Accession #:    [id number] Date of Birth: 1946/05/08       Patient Gender: F Patient Age:   63 years Exam Location:  [facility] Procedure:      VAS [place] LOWER EXTREMITY VENOUS REFLUX Referring Phys: Sheppard Plumber --------------------------------------------------------------------------------  Indications: Pain, Swelling, Edema, and rt knee.  Performing  Technologist: Salvadore Farber RVT  Examination Guidelines: A complete evaluation includes B-mode imaging, spectral Doppler, color Doppler, and power Doppler as needed of all accessible portions of each vessel. Bilateral testing is considered an integral part of a complete examination. Limited examinations for reoccurring indications may be performed as noted. The reflux portion of the exam is performed with the patient in reverse Trendelenburg. Significant venous reflux is defined as >500 ms in the superficial venous system, and >1 second in the deep venous system.   Summary: Right: - No evidence of deep vein thrombosis seen in the right lower extremity, from the common femoral through the popliteal veins. - No evidence of superficial venous thrombosis in the right lower extremity. - There is no evidence of venous reflux seen in the right lower extremity. - No evidence of superficial venous reflux seen in the right greater saphenous vein. - No evidence of superficial venous reflux seen in the right short saphenous vein.  *See table(s) above for measurements and observations. Electronically signed by Levora Dredge MD on 12/21/2023 at 8:33:40 AM.    Final    CT CHEST  ABDOMEN PELVIS W CONTRAST Result Date: 11/28/2023 CLINICAL DATA:  Rectal cancer, follow-up.  * Tracking Code: BO * EXAM: CT CHEST, ABDOMEN, AND PELVIS WITH CONTRAST TECHNIQUE: Multidetector CT imaging of the chest, abdomen and pelvis was performed following the standard protocol during bolus administration of intravenous contrast. RADIATION DOSE REDUCTION: This exam was performed according to the departmental dose-optimization program which includes automated exposure control, adjustment of the mA and/or kV according to patient size and/or use of iterative reconstruction technique. CONTRAST:  85mL OMNIPAQUE IOHEXOL 300 MG/ML  SOLN COMPARISON:  Multiple priors including CT September 24, 2023 FINDINGS: CT CHEST FINDINGS Cardiovascular: Right chest Port-A-Cath  with tip in the right atrium. Thoracic aortic atherosclerosis. Dilated main pulmonary artery. Coronary artery calcifications. Normal size heart. Mediastinum/Nodes: Prominent supraclavicular lymph nodes measure 9 mm on image 6/2 previously 7 mm. No pathologically enlarged mediastinal, hilar or axillary lymph nodes. No suspicious thyroid nodule.  Tiny hiatal hernia. Lungs/Pleura: Numerous bilateral solid pulmonary nodules are again seen. Some of which have increased in size and others are stable in size. For reference:: -medial right middle lobe pulmonary nodule measures 18 x 16 mm on image 95/3 previously 16 x 14 mm. -anterior left lower lobe pulmonary nodule measures 7 mm on image 111/3, unchanged. -left upper lobe pulmonary nodule measures 9 mm on image 65/3 previously 7 mm. Musculoskeletal: No aggressive lytic or blastic lesion of bone. Multilevel degenerative change of the spine. CT ABDOMEN PELVIS FINDINGS Hepatobiliary: Increased size of the subcapsular lesion in the anterior inferior right lobe of the liver now measuring 10 mm on image 69/2 previously 8 mm. Gallbladder is surgically absent. No biliary ductal dilation. Pancreas: No pancreatic ductal dilation or evidence of acute inflammation. Spleen: No splenomegaly.  Stable splenic lymphangioma/cysts. Adrenals/Urinary Tract: No suspicious adrenal nodule. Stable bilateral renal cysts. No hydronephrosis. Urinary bladder is unremarkable for degree of distension. Stomach/Bowel: Radiopaque enteric contrast material traverses the transverse colon. Stomach is unremarkable for degree of distension. No pathologic dilation of small or large bowel. Questionable wall thickening of matted loops of small bowel in the left lower quadrant on image 89/2. Prior partial colectomy and rectal resection with left anterior abdominal wall colostomy. Fluid and soft tissue stranding in the mesorectal/presacral space without discrete enhancing soft tissue nodularity. Vascular/Lymphatic:  Aortic atherosclerosis. Increased size of retroperitoneal, iliac side chain and inguinal lymph nodes. For reference: Left periaortic lymph node measures 12 mm in short axis on image 66/2 previously 9 mm. -right external iliac lymph node measuring 18 mm in short axis on image 98/2 previously measured 14 mm -Right pelvic sidewall lymph node measures 10 mm in short axis on image 93/2 previously 7 mm. Reproductive: Uterus is surgically absent. Other: Small volume abdominopelvic free fluid. Postsurgical change in the abdominal wall. Musculoskeletal: Similar appearance of the permeative lytic metastatic lesion in the left iliac wing. No new aggressive lytic or blastic lesion of bone. Multilevel degenerative change of the spine. Demineralization of bone. IMPRESSION: 1. Increased size of retroperitoneal, iliac side chain and inguinal lymph nodes, concerning for progressive nodal metastatic disease. 2. Numerous bilateral solid pulmonary nodules are again seen, some of which have increased in size and others are stable in size overall compatible with disease progression. 3. Increased size of the subcapsular lesion in the anterior inferior right lobe of the liver, which has been stable over multiple prior examinations and was not FDG avid on prior PET-CT. Suggest attention on follow-up imaging. 4. Similar appearance of the permeative lytic metastatic lesion in the left  iliac wing. 5. Questionable wall thickening of matted loops of small bowel in the left lower quadrant, suggest attention on follow-up imaging. 6. Small volume abdominopelvic free fluid. 7.  Aortic Atherosclerosis (ICD10-I70.0). Electronically Signed   By: Tama Fails M.D.   On: 11/28/2023 15:20   DG Bone Density Result Date: 11/19/2023 EXAM: DUAL X-RAY ABSORPTIOMETRY (DXA) FOR BONE MINERAL DENSITY IMPRESSION: Your patient Kimiko Common completed a BMD test on 11/19/2023 using the [employer] DXA System (software version: 14.10) manufactured by [employer]. The following summarizes the results of our evaluation. Technologist:VLM PATIENT BIOGRAPHICAL: Name: Tehilla, Coffel Patient ID: [id number] Birth Date: 1946/10/06 Height: 64.0 in. Gender: Female Exam Date: 11/19/2023 Weight: 171.2 lbs. Indications: Chemo, Colon CA, Hysterectomy Fractures: Treatments: Vitamin D DENSITOMETRY RESULTS: Site      Region        Measured Date Measured Age WHO Classification Young Adult T-score BMD         %Change vs. Previous Significant Change (*) AP Spine L1-L4 (L2,L3) 11/19/2023 76.6 Normal 0.2 1.203 g/cm2 - - DualFemur Total Right 11/19/2023 76.6 Normal -0.5 0.946 g/cm2 - - ASSESSMENT: The BMD measured at Femur Total Right is 0.946 g/cm2 with a T-score of -0.5. This patient is considered normal according to World Health Organization [facility]) criteria. L-2 & L-3 were excluded due to degenerative changes. The scan quality is good. World Science writer [facility]) criteria for post-menopausal, Caucasian Women: Normal:                   T-score at or above -1 SD Osteopenia/low bone mass: T-score between -1 and -2.5 SD Osteoporosis:             T-score at or below -2.5 SD RECOMMENDATIONS: 1. All patients should optimize calcium and vitamin D intake. 2. Consider FDA-approved medical therapies in postmenopausal women and men aged 7 years and older, based on the following: a. A hip or vertebral(clinical or morphometric) fracture b. T-score < -2.5 at the femoral neck or spine after appropriate evaluation to exclude secondary causes c. Low bone mass (T-score between -1.0 and -2.5 at the femoral neck or spine) and a 10-year probability of a hip fracture > 3% or a 10-year probability of a major osteoporosis-related fracture > 20% based on the [place] -adapted WHO algorithm 3. Clinician judgment and/or patient preferences may indicate treatment for people with 10-year fracture probabilities above or below these levels FOLLOW-UP: People with diagnosed cases of osteoporosis or at high risk for  fracture should have regular bone mineral density tests. For patients eligible for Medicare, routine testing is allowed once every 2 years. The testing frequency can be increased to one year for patients who have rapidly progressing disease, those who are receiving or discontinuing medical therapy to restore bone mass, or have additional risk factors. I have reviewed this report, and agree with the above findings. [facility] Electronically Signed   By: Alinda Apley M.D.   On: 11/19/2023 10:00

## 2024-01-31 NOTE — Progress Notes (Signed)
 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  Receiving lonsurf and bevacizumab.    Met with patient and daughter during infusion.  Reports that her appetite is good.  Usually eats oatmeal or toast and applesauce or cheese toast or bacon, eggs, potatoes and toast for breakfast with hot tea.  Lunch is usually a sandwich or peaches and jello.  Dinner is microwave meal.  Likes cereal and sweet potatoes, yams, ice cream.  Drinks ensure 2 times a day.  Drinks pedialyte, water, juice, cold and hot tea, gingerale.      Medications: reviewed  Labs: reviewed  Anthropometrics:   Weight 157 lb today  169 lb on 1/28 (likely edema)  Dtr reports seen in Feb and given compression socks to wear and fluid has improved.  163 lb on 12/31 162 lb on 11/09/22    NUTRITION DIAGNOSIS: Altered GI function improving   INTERVENTION:  Encouraged 350 calorie shake or higher.  Coupons given Encouraged adding yogurt, mix ice cream in shake for milkshake.   MONITORING, EVALUATION, GOAL: weight trends, intake   NEXT VISIT: Thursday, May 15 during infusion  Jacqueline Yoder, CSO, LDN Registered Dietitian [phone]

## 2024-01-31 NOTE — Assessment & Plan Note (Addendum)
 History of stage IIIC Rectal cancer, s/p TNT, followed by 09/17/19 APR/posterior vaginectomy/TAH/BSO/VY-flap, pT4b pN0 with close vaginal margin 0.2 mm.  Uterus and ovaries negative for malignancy. palliative radiation to vaginal recurrence- 01/19/21 recurrence with lung metastasis.-Palliative -FOLFIRI plus bevacizumab.  Irinotecan was dropped in November 2022 due to side effects. Negative for UGT1A1*28 - radiographically stable, rise of CEA-July 2023 CT lung metastasis worse--> Dec 2023 PET showed progression in pelvic lymph nodes and bone lesions,--> 2nd line irinotecan +panitumumab-->CT March 2024 stable. --> June 2024 CT showed mild lung progression/Progressive left iliac bone metastasis, vulvar mass difficult for biopsy in office,  Tempus Liquid biopsy - no actionable variants --> 06/25/2023 CT showed progression of lung nodules [SLD increase 30mm] and iliac inguinal lymphadenopathy--> 07/23/23 switch to dose reduced FOLFOX --> Dec 2024 CT- stable disease. --> Feb 2025 disease progression--> longsurf + Bevacizumab  Labs are reviewed and discussed with patient   CEA nadir was in 490, progressively increases - 10,193  Currently on longsurf [60mg  BID day 1-5 and day 8- 12 every 28-day  + Bevacizumab D1 proceed with D15 Bevacizumab Proceed with  cycle 2  D15 Bevacizumab

## 2024-01-31 NOTE — Patient Instructions (Signed)
 [facility] - A DEPT OF [facility]  Discharge Instructions: Thank you for choosing [facility] to provide your oncology and hematology care.  If you have a lab appointment with the Cancer Center, please go directly to the Cancer Center and check in at the registration area.  Wear comfortable clothing and clothing appropriate for easy access to any Portacath or PICC line.   We strive to give you quality time with your provider. You may need to reschedule your appointment if you arrive late (15 or more minutes).  Arriving late affects you and other patients whose appointments are after yours.  Also, if you miss three or more appointments without notifying the office, you may be dismissed from the clinic at the provider's discretion.      For prescription refill requests, have your pharmacy contact our office and allow 72 hours for refills to be completed.    Today you received the following chemotherapy and/or immunotherapy agents- bevacizumab      To help prevent nausea and vomiting after your treatment, we encourage you to take your nausea medication as directed.  BELOW ARE SYMPTOMS THAT SHOULD BE REPORTED IMMEDIATELY: *FEVER GREATER THAN 100.4 F (38 C) OR HIGHER *CHILLS OR SWEATING *NAUSEA AND VOMITING THAT IS NOT CONTROLLED WITH YOUR NAUSEA MEDICATION *UNUSUAL SHORTNESS OF BREATH *UNUSUAL BRUISING OR BLEEDING *URINARY PROBLEMS (pain or burning when urinating, or frequent urination) *BOWEL PROBLEMS (unusual diarrhea, constipation, pain near the anus) TENDERNESS IN MOUTH AND THROAT WITH OR WITHOUT PRESENCE OF ULCERS (sore throat, sores in mouth, or a toothache) UNUSUAL RASH, SWELLING OR PAIN  UNUSUAL VAGINAL DISCHARGE OR ITCHING   Items with * indicate a potential emergency and should be followed up as soon as possible or go to the Emergency Department if any problems should occur.  Please show the CHEMOTHERAPY ALERT CARD or IMMUNOTHERAPY  ALERT CARD at check-in to the Emergency Department and triage nurse.  Should you have questions after your visit or need to cancel or reschedule your appointment, please contact [facility] - A DEPT OF [facility]  [phone] and follow the prompts.  Office hours are 8:00 a.m. to 4:30 p.m. Monday - Friday. Please note that voicemails left after 4:00 p.m. may not be returned until the following business day.  We are closed weekends and major holidays. You have access to a nurse at all times for urgent questions. Please call the main number to the clinic [phone] and follow the prompts.  For any non-urgent questions, you may also contact your provider using MyChart. We now offer e-Visits for anyone 22 and older to request care online for non-urgent symptoms. For details visit [email or web address].   Also download the MyChart app! Go to the app store, search "MyChart", open the app, select [place], and log in with your MyChart username and password.

## 2024-01-31 NOTE — Progress Notes (Signed)
 Pt here for follow up. Pt is needing lonsurf refill

## 2024-01-31 NOTE — Assessment & Plan Note (Signed)
 continue lomotil  QID PRN as instructed.  She has not needed to take antidiarrhea.

## 2024-01-31 NOTE — Assessment & Plan Note (Signed)
 Hb stable, observation

## 2024-02-01 ENCOUNTER — Other Ambulatory Visit: Payer: Self-pay

## 2024-02-01 ENCOUNTER — Ambulatory Visit
Admission: RE | Admit: 2024-02-01 | Discharge: 2024-02-01 | Disposition: A | Discharge status: Home / Self Care | Source: Ambulatory Visit | Attending: Radiation Oncology | Admitting: Radiation Oncology

## 2024-02-01 DIAGNOSIS — C792 Secondary malignant neoplasm of skin: Secondary | ICD-10-CM | POA: Diagnosis not present

## 2024-02-01 DIAGNOSIS — C7802 Secondary malignant neoplasm of left lung: Secondary | ICD-10-CM | POA: Diagnosis not present

## 2024-02-01 DIAGNOSIS — I700 Atherosclerosis of aorta: Secondary | ICD-10-CM | POA: Diagnosis not present

## 2024-02-01 DIAGNOSIS — Z87891 Personal history of nicotine dependence: Secondary | ICD-10-CM | POA: Diagnosis not present

## 2024-02-01 DIAGNOSIS — Z510 Encounter for antineoplastic radiation therapy: Secondary | ICD-10-CM | POA: Diagnosis not present

## 2024-02-01 DIAGNOSIS — T451X5A Adverse effect of antineoplastic and immunosuppressive drugs, initial encounter: Secondary | ICD-10-CM | POA: Diagnosis not present

## 2024-02-01 DIAGNOSIS — E7800 Pure hypercholesterolemia, unspecified: Secondary | ICD-10-CM | POA: Diagnosis not present

## 2024-02-01 DIAGNOSIS — I10 Essential (primary) hypertension: Secondary | ICD-10-CM | POA: Diagnosis not present

## 2024-02-01 DIAGNOSIS — R188 Other ascites: Secondary | ICD-10-CM | POA: Diagnosis not present

## 2024-02-01 DIAGNOSIS — Z933 Colostomy status: Secondary | ICD-10-CM | POA: Diagnosis not present

## 2024-02-01 DIAGNOSIS — Z9221 Personal history of antineoplastic chemotherapy: Secondary | ICD-10-CM | POA: Diagnosis not present

## 2024-02-01 DIAGNOSIS — Z7901 Long term (current) use of anticoagulants: Secondary | ICD-10-CM | POA: Diagnosis not present

## 2024-02-01 DIAGNOSIS — Z79899 Other long term (current) drug therapy: Secondary | ICD-10-CM | POA: Diagnosis not present

## 2024-02-01 DIAGNOSIS — D6481 Anemia due to antineoplastic chemotherapy: Secondary | ICD-10-CM | POA: Diagnosis not present

## 2024-02-01 DIAGNOSIS — Z5112 Encounter for antineoplastic immunotherapy: Secondary | ICD-10-CM | POA: Diagnosis not present

## 2024-02-01 DIAGNOSIS — C7951 Secondary malignant neoplasm of bone: Secondary | ICD-10-CM | POA: Diagnosis not present

## 2024-02-01 DIAGNOSIS — C7801 Secondary malignant neoplasm of right lung: Secondary | ICD-10-CM | POA: Diagnosis not present

## 2024-02-01 DIAGNOSIS — Z86711 Personal history of pulmonary embolism: Secondary | ICD-10-CM | POA: Diagnosis not present

## 2024-02-01 DIAGNOSIS — C20 Malignant neoplasm of rectum: Secondary | ICD-10-CM | POA: Diagnosis not present

## 2024-02-01 DIAGNOSIS — G629 Polyneuropathy, unspecified: Secondary | ICD-10-CM | POA: Diagnosis not present

## 2024-02-01 LAB — RAD ONC ARIA SESSION SUMMARY
Course Elapsed Days: 4
Plan Fractions Treated to Date: 5
Plan Prescribed Dose Per Fraction: 4 Gy
Plan Total Fractions Prescribed: 5
Plan Total Prescribed Dose: 20 Gy
Reference Point Dosage Given to Date: 20 Gy
Reference Point Session Dosage Given: 4 Gy
Session Number: 5

## 2024-02-04 NOTE — Radiation Completion Notes (Signed)
 Patient Name: Jacqueline Yoder, FERRER MRN: [id number] Date of Birth: [birth year] Referring Physician: Domingo Friend, M.D. Date of Service: 2024-02-04 Radiation Oncologist: Glenis Langdon, M.D. [facility] [place]                             RADIATION ONCOLOGY END OF TREATMENT NOTE     Diagnosis: C79.51 Secondary malignant neoplasm of bone Staging on 2021-01-31: Rectal cancer (HCC) T=pTX, N=pNX, M=cM1 Staging on 2020-06-30: Rectal cancer (HCC) T=cT4b, N=cN1a, M=cM1 Intent: Palliative     HPI: Patient is a 78 year old female previously treated back in 21 for stage IIIc adenocarcinoma the rectum.  She is gone on to develop widespread metastatic disease.  She saw me about 2 weeks ago complaining significant pain in her right shoulder she also had biopsy-proven metastasis to her skin in her right inguinal region.  I ordered a PET CT scan which showed.  Progressive disease in the left supraclavicular region pulmonary metastatic disease progressive abdominal pelvic adenopathy and progressive multifocal skeletal involvement.  She had large area of hypermetabolic activity in the right humeral head which I will be targeting for palliative radiation.  She also has metastatic disease in T11 L4 also showed focal involvement of subcutaneous tissue along the right inguinal fold.  She is not complaining of any significant back pain she is does state she has some slight burning sensation in her right inguinal region.      ==========DELIVERED PLANS==========  First Treatment Date: 2024-01-28 Last Treatment Date: 2024-02-01   Plan Name: Ext_R_Humerus Site: Humerus, Right Technique: Isodose Plan Mode: Photon Dose Per Fraction: 4 Gy Prescribed Dose (Delivered / Prescribed): 20 Gy / 20 Gy Prescribed Fxs (Delivered / Prescribed): 5 / 5     ==========ON TREATMENT VISIT DATES========== 2024-01-29     ==========UPCOMING VISITS========== 03/05/2024 [facility] RAD ONCOLOGY FOLLOW UP  30 Glenis Langdon, MD  02/28/2024 [facility] MED ONC NUT 45 [place], RD  02/28/2024 [facility] MED ONC PORT FLUSH W/LAB [facility]PORT FLUSH  02/28/2024 [facility] ONC EST PT 15 Timmy Forbes, MD  02/28/2024 [facility] MED ONC INFUSION 2HR (120) [facility]- MO INFUSION CHAIR 3  02/14/2024 [facility] MED ONC PORT FLUSH W/LAB [facility]-PORT FLUSH  02/14/2024 [facility] ONC EST PT 15 Timmy Forbes, MD  02/14/2024 [facility] MED ONC INFUSION 2HR (120) [facility]- MO INFUSION CHAIR 11  02/08/2024 [facility] EST PT 15 [place], Jennifer L, DPM        ==========APPENDIX - ON TREATMENT VISIT NOTES==========   See weekly On Treatment Notes in Epic for details in the Media tab (listed as Progress notes on the On Treatment Visit Dates listed above).

## 2024-02-07 ENCOUNTER — Ambulatory Visit

## 2024-02-07 ENCOUNTER — Ambulatory Visit: Admitting: Oncology

## 2024-02-07 ENCOUNTER — Other Ambulatory Visit

## 2024-02-07 ENCOUNTER — Other Ambulatory Visit: Payer: Self-pay | Admitting: Family Medicine

## 2024-02-07 DIAGNOSIS — I129 Hypertensive chronic kidney disease with stage 1 through stage 4 chronic kidney disease, or unspecified chronic kidney disease: Secondary | ICD-10-CM

## 2024-02-08 ENCOUNTER — Ambulatory Visit (INDEPENDENT_AMBULATORY_CARE_PROVIDER_SITE_OTHER): Payer: 59 | Admitting: Podiatry

## 2024-02-08 ENCOUNTER — Encounter: Payer: Self-pay | Admitting: Podiatry

## 2024-02-08 VITALS — Ht 64.0 in | Wt 157.0 lb

## 2024-02-08 DIAGNOSIS — B351 Tinea unguium: Secondary | ICD-10-CM

## 2024-02-08 DIAGNOSIS — M79675 Pain in left toe(s): Secondary | ICD-10-CM

## 2024-02-08 DIAGNOSIS — M79674 Pain in right toe(s): Secondary | ICD-10-CM | POA: Diagnosis not present

## 2024-02-08 DIAGNOSIS — G620 Drug-induced polyneuropathy: Secondary | ICD-10-CM | POA: Diagnosis not present

## 2024-02-08 DIAGNOSIS — D689 Coagulation defect, unspecified: Secondary | ICD-10-CM

## 2024-02-08 DIAGNOSIS — T451X5A Adverse effect of antineoplastic and immunosuppressive drugs, initial encounter: Secondary | ICD-10-CM | POA: Diagnosis not present

## 2024-02-08 NOTE — Telephone Encounter (Signed)
 1 year supply not appropriate at this time. Requested Prescriptions  Pending Prescriptions Disp Refills   triamterene -hydrochlorothiazide  (DYAZIDE ) 37.5-25 MG capsule [Pharmacy Med Name: Triamterene -HCTZ 37.5-25 MG Oral Capsule] 100 capsule 2    Sig: TAKE 1 CAPSULE BY MOUTH EACH  DAILY     Cardiovascular: Diuretic Combos Passed - 02/08/2024  1:27 PM      Passed - K in normal range and within 180 days    Potassium  Date Value Ref Range Status  01/31/2024 3.9 3.5 - 5.1 mmol/L Final         Passed - Na in normal range and within 180 days    Sodium  Date Value Ref Range Status  01/31/2024 136 135 - 145 mmol/L Final         Passed - Cr in normal range and within 180 days    Creatinine  Date Value Ref Range Status  01/31/2024 0.80 0.44 - 1.00 mg/dL Final   Creat  Date Value Ref Range Status  06/24/2021 1.10 (H) 0.60 - 1.00 mg/dL Final         Passed - Last BP in normal range    BP Readings from Last 1 Encounters:  01/31/24 (!) 131/57         Passed - Valid encounter within last 6 months    Recent Outpatient Visits           1 month ago Metastatic adenocarcinoma [facility])   [place] [facility] Raina Bunting, DO   1 month ago Chronic right shoulder pain   [place] [facility] [place], [place] W, NP   2 months ago Benign hypertension with CKD (chronic kidney disease), stage II   [place] [facility] [place], Kayleen Party, DO   2 months ago Chronic right shoulder pain   [place] [facility] [place], [place], [place]

## 2024-02-09 ENCOUNTER — Other Ambulatory Visit (HOSPITAL_COMMUNITY): Payer: Self-pay

## 2024-02-14 ENCOUNTER — Inpatient Hospital Stay: Attending: Oncology

## 2024-02-14 ENCOUNTER — Inpatient Hospital Stay (HOSPITAL_BASED_OUTPATIENT_CLINIC_OR_DEPARTMENT_OTHER): Admitting: Oncology

## 2024-02-14 ENCOUNTER — Inpatient Hospital Stay

## 2024-02-14 ENCOUNTER — Other Ambulatory Visit (HOSPITAL_COMMUNITY): Payer: Self-pay

## 2024-02-14 ENCOUNTER — Encounter: Payer: Self-pay | Admitting: Oncology

## 2024-02-14 VITALS — BP 136/65 | HR 97 | Temp 97.0°F | Resp 18 | Wt 158.8 lb

## 2024-02-14 DIAGNOSIS — C799 Secondary malignant neoplasm of unspecified site: Secondary | ICD-10-CM

## 2024-02-14 DIAGNOSIS — C7802 Secondary malignant neoplasm of left lung: Secondary | ICD-10-CM | POA: Insufficient documentation

## 2024-02-14 DIAGNOSIS — K521 Toxic gastroenteritis and colitis: Secondary | ICD-10-CM

## 2024-02-14 DIAGNOSIS — C792 Secondary malignant neoplasm of skin: Secondary | ICD-10-CM | POA: Diagnosis not present

## 2024-02-14 DIAGNOSIS — D6481 Anemia due to antineoplastic chemotherapy: Secondary | ICD-10-CM

## 2024-02-14 DIAGNOSIS — K219 Gastro-esophageal reflux disease without esophagitis: Secondary | ICD-10-CM | POA: Diagnosis not present

## 2024-02-14 DIAGNOSIS — Z800 Family history of malignant neoplasm of digestive organs: Secondary | ICD-10-CM | POA: Diagnosis not present

## 2024-02-14 DIAGNOSIS — J90 Pleural effusion, not elsewhere classified: Secondary | ICD-10-CM | POA: Insufficient documentation

## 2024-02-14 DIAGNOSIS — K56699 Other intestinal obstruction unspecified as to partial versus complete obstruction: Secondary | ICD-10-CM | POA: Insufficient documentation

## 2024-02-14 DIAGNOSIS — Z79899 Other long term (current) drug therapy: Secondary | ICD-10-CM | POA: Insufficient documentation

## 2024-02-14 DIAGNOSIS — Z87891 Personal history of nicotine dependence: Secondary | ICD-10-CM | POA: Insufficient documentation

## 2024-02-14 DIAGNOSIS — K435 Parastomal hernia without obstruction or  gangrene: Secondary | ICD-10-CM | POA: Diagnosis not present

## 2024-02-14 DIAGNOSIS — D702 Other drug-induced agranulocytosis: Secondary | ICD-10-CM | POA: Diagnosis not present

## 2024-02-14 DIAGNOSIS — Z5111 Encounter for antineoplastic chemotherapy: Secondary | ICD-10-CM | POA: Diagnosis not present

## 2024-02-14 DIAGNOSIS — Z923 Personal history of irradiation: Secondary | ICD-10-CM | POA: Diagnosis not present

## 2024-02-14 DIAGNOSIS — Z7901 Long term (current) use of anticoagulants: Secondary | ICD-10-CM

## 2024-02-14 DIAGNOSIS — I10 Essential (primary) hypertension: Secondary | ICD-10-CM | POA: Diagnosis not present

## 2024-02-14 DIAGNOSIS — C7951 Secondary malignant neoplasm of bone: Secondary | ICD-10-CM | POA: Diagnosis not present

## 2024-02-14 DIAGNOSIS — R197 Diarrhea, unspecified: Secondary | ICD-10-CM

## 2024-02-14 DIAGNOSIS — R188 Other ascites: Secondary | ICD-10-CM | POA: Insufficient documentation

## 2024-02-14 DIAGNOSIS — I825Z3 Chronic embolism and thrombosis of unspecified deep veins of distal lower extremity, bilateral: Secondary | ICD-10-CM | POA: Diagnosis not present

## 2024-02-14 DIAGNOSIS — S42294D Other nondisplaced fracture of upper end of right humerus, subsequent encounter for fracture with routine healing: Secondary | ICD-10-CM

## 2024-02-14 DIAGNOSIS — Z9221 Personal history of antineoplastic chemotherapy: Secondary | ICD-10-CM | POA: Diagnosis not present

## 2024-02-14 DIAGNOSIS — C7801 Secondary malignant neoplasm of right lung: Secondary | ICD-10-CM | POA: Insufficient documentation

## 2024-02-14 DIAGNOSIS — Z86718 Personal history of other venous thrombosis and embolism: Secondary | ICD-10-CM | POA: Insufficient documentation

## 2024-02-14 DIAGNOSIS — C20 Malignant neoplasm of rectum: Secondary | ICD-10-CM | POA: Diagnosis not present

## 2024-02-14 DIAGNOSIS — T451X5A Adverse effect of antineoplastic and immunosuppressive drugs, initial encounter: Secondary | ICD-10-CM | POA: Insufficient documentation

## 2024-02-14 DIAGNOSIS — S42209A Unspecified fracture of upper end of unspecified humerus, initial encounter for closed fracture: Secondary | ICD-10-CM | POA: Diagnosis not present

## 2024-02-14 DIAGNOSIS — Z86711 Personal history of pulmonary embolism: Secondary | ICD-10-CM

## 2024-02-14 DIAGNOSIS — R590 Localized enlarged lymph nodes: Secondary | ICD-10-CM | POA: Insufficient documentation

## 2024-02-14 DIAGNOSIS — R92321 Mammographic fibroglandular density, right breast: Secondary | ICD-10-CM | POA: Insufficient documentation

## 2024-02-14 DIAGNOSIS — M810 Age-related osteoporosis without current pathological fracture: Secondary | ICD-10-CM | POA: Insufficient documentation

## 2024-02-14 DIAGNOSIS — I700 Atherosclerosis of aorta: Secondary | ICD-10-CM | POA: Diagnosis not present

## 2024-02-14 DIAGNOSIS — Z5189 Encounter for other specified aftercare: Secondary | ICD-10-CM | POA: Diagnosis not present

## 2024-02-14 DIAGNOSIS — E7800 Pure hypercholesterolemia, unspecified: Secondary | ICD-10-CM | POA: Diagnosis not present

## 2024-02-14 DIAGNOSIS — Z933 Colostomy status: Secondary | ICD-10-CM | POA: Insufficient documentation

## 2024-02-14 LAB — CBC WITH DIFFERENTIAL (CANCER CENTER ONLY)
Abs Immature Granulocytes: 0 10*3/uL (ref 0.00–0.07)
Basophils Absolute: 0 10*3/uL (ref 0.0–0.1)
Basophils Relative: 1 %
Eosinophils Absolute: 0.1 10*3/uL (ref 0.0–0.5)
Eosinophils Relative: 3 %
HCT: 33 % — ABNORMAL LOW (ref 36.0–46.0)
Hemoglobin: 10.4 g/dL — ABNORMAL LOW (ref 12.0–15.0)
Immature Granulocytes: 0 %
Lymphocytes Relative: 52 %
Lymphs Abs: 1.1 10*3/uL (ref 0.7–4.0)
MCH: 31.6 pg (ref 26.0–34.0)
MCHC: 31.5 g/dL (ref 30.0–36.0)
MCV: 100.3 fL — ABNORMAL HIGH (ref 80.0–100.0)
Monocytes Absolute: 0.4 10*3/uL (ref 0.1–1.0)
Monocytes Relative: 17 %
Neutro Abs: 0.6 10*3/uL — ABNORMAL LOW (ref 1.7–7.7)
Neutrophils Relative %: 27 %
Platelet Count: 192 10*3/uL (ref 150–400)
RBC: 3.29 MIL/uL — ABNORMAL LOW (ref 3.87–5.11)
RDW: 18.6 % — ABNORMAL HIGH (ref 11.5–15.5)
WBC Count: 2.1 10*3/uL — ABNORMAL LOW (ref 4.0–10.5)
nRBC: 0 % (ref 0.0–0.2)

## 2024-02-14 LAB — CMP (CANCER CENTER ONLY)
ALT: 18 U/L (ref 0–44)
AST: 31 U/L (ref 15–41)
Albumin: 3.7 g/dL (ref 3.5–5.0)
Alkaline Phosphatase: 73 U/L (ref 38–126)
Anion gap: 8 (ref 5–15)
BUN: 23 mg/dL (ref 8–23)
CO2: 27 mmol/L (ref 22–32)
Calcium: 9 mg/dL (ref 8.9–10.3)
Chloride: 103 mmol/L (ref 98–111)
Creatinine: 0.76 mg/dL (ref 0.44–1.00)
GFR, Estimated: >60 mL/min (ref >60)
Glucose, Bld: 107 mg/dL — ABNORMAL HIGH (ref 70–99)
Potassium: 3.6 mmol/L (ref 3.5–5.1)
Sodium: 138 mmol/L (ref 135–145)
Total Bilirubin: 0.4 mg/dL (ref 0.0–1.2)
Total Protein: 7.1 g/dL (ref 6.5–8.1)

## 2024-02-14 LAB — PROTEIN, URINE, RANDOM: Total Protein, Urine: 9 mg/dL

## 2024-02-14 MED ORDER — FILGRASTIM-SNDZ 480 MCG/0.8ML IJ SOSY
480.0000 ug | PREFILLED_SYRINGE | Freq: Once | INTRAMUSCULAR | Status: AC
  Administered 2024-02-14: 480 ug via SUBCUTANEOUS
  Filled 2024-02-14: qty 0.8

## 2024-02-14 MED ORDER — HEPARIN SOD (PORK) LOCK FLUSH 100 UNIT/ML IV SOLN
500.0000 [IU] | Freq: Once | INTRAVENOUS | Status: AC
  Administered 2024-02-14: 500 [IU] via INTRAVENOUS
  Filled 2024-02-14: qty 5

## 2024-02-14 MED ORDER — APIXABAN 2.5 MG PO TABS
2.5000 mg | ORAL_TABLET | Freq: Two times a day (BID) | ORAL | 2 refills | Status: DC
Start: 2024-02-14 — End: 2024-07-29

## 2024-02-14 NOTE — Assessment & Plan Note (Signed)
 History of PE and bilateral lower extremity DVT  05/18/23 Repeat right LE US showed chronic non occlusive DVT Continue  Eliquis 2.5 mg twice daily for anticoagulation prophylaxis. Recommend leg elevation and compression stocking.

## 2024-02-14 NOTE — Assessment & Plan Note (Signed)
 History of stage IIIC Rectal cancer, s/p TNT, followed by 09/17/19 APR/posterior vaginectomy/TAH/BSO/VY-flap, pT4b pN0 with close vaginal margin 0.2 mm.  Uterus and ovaries negative for malignancy. palliative radiation to vaginal recurrence- 01/19/21 recurrence with lung metastasis.-Palliative -FOLFIRI plus bevacizumab .  Irinotecan  was dropped in November 2022 due to side effects. Negative for UGT1A1*28 - radiographically stable, rise of CEA-July 2023 CT lung metastasis worse--> Dec 2023 PET showed progression in pelvic lymph nodes and bone lesions,--> 2nd line irinotecan  +panitumumab -->CT March 2024 stable. --> June 2024 CT showed mild lung progression/Progressive left iliac bone metastasis, vulvar mass difficult for biopsy in office,  Tempus Liquid biopsy - no actionable variants --> 06/25/2023 CT showed progression of lung nodules [SLD increase 75mm] and iliac inguinal lymphadenopathy--> 07/23/23 switch to dose reduced FOLFOX --> Dec 2024 CT- stable disease. --> Feb 2025 disease progression--> longsurf + Bevacizumab   Labs are reviewed and discussed with patient   CEA nadir was in 490, progressively increases - 10,193  Currently on longsurf [60mg  BID day 1-5 and day 8- 12 every 28-day  + Bevacizumab  D1 proceed with D15 Bevacizumab  Hold off  cycle 3  D1 Lngsuf and  Bevacizumab 

## 2024-02-14 NOTE — Progress Notes (Signed)
  Subjective:  Patient ID: Jacqueline Yoder, female    DOB: Dec 12, 1945,  MRN: [id number]  78 y.o. female presents to clinic with  at risk foot care with h/o neuropathy secondary to chemotherapy and painful, elongated thickened toenails x 10 which are symptomatic when wearing enclosed shoe gear. This interferes with his/her daily activities. She is accompanied by her daughter on today's visit. Chief Complaint  Patient presents with   Nail Problem    Pt is here for [facility] PCP is Dr Romeo Co and LOV was in March.   New problem(s): None   PCP is Raina Bunting, DO.  Allergies  Allergen Reactions   Sulfamethoxazole-Trimethoprim Other (See Comments)    Also said she had chills and pressure in nose.    Review of Systems: Negative except as noted in the HPI.   Objective:  ADILENE ROQUE is a pleasant 78 y.o. female WD, WN in NAD. AAO x 3.  Vascular Examination: Vascular status intact b/l with palpable pedal pulses. CFT immediate b/l. No edema LLE; trace edema RLE. No pain with calf compression b/l. Skin temperature gradient WNL b/l. No ischemia or gangrene noted b/l LE. No cyanosis or clubbing noted b/l LE.  Neurological Examination: Pt has subjective symptoms of neuropathy. Protective sensation diminished with 10g monofilament b/l.  Dermatological Examination: Pedal skin with normal turgor, texture and tone b/l. Toenails 1-5 b/l thick, discolored, elongated with subungual debris and pain on dorsal palpation. No hyperkeratotic lesions noted b/l.   Musculoskeletal Examination: Muscle strength 5/5 to b/l LE. No pain, crepitus or joint limitation noted with ROM bilateral LE. No gross bony deformities bilaterally.  Radiographs: None  Last A1c:      Latest Ref Rng & Units 07/04/2023    8:45 AM  Hemoglobin A1C  Hemoglobin-A1c <5.7 % of total Hgb 5.1      Assessment:   1. Pain due to onychomycosis of toenails of both feet   2. Chemotherapy-induced neuropathy (HCC)   3.  Coagulation defect (HCC)    Plan:  Patient was evaluated and treated. All patient's and/or POA's questions/concerns addressed on today's visit. Toenails 1-5 debrided in length and girth without incident. Continue soft, supportive shoe gear daily. Report any pedal injuries to medical professional. Call office if there are any questions/concerns. -Patient/POA to call should there be question/concern in the interim.  Return in about 3 months (around 05/09/2024).  Luella Sager, DPM      [place] LOCATION: [address], [place] [address]                   Office [phone]   [facility] LOCATION: [address] [place], [place] [address] Office [phone]

## 2024-02-14 NOTE — Assessment & Plan Note (Addendum)
 ANC 0.6 Neutropenia precaution was discussed with patient.  Recommend Zarxio  480mcg daily x 2.  Plan to add Prophylactic Nneulasta to D15 treatment of future cycles.

## 2024-02-14 NOTE — Assessment & Plan Note (Signed)
 continue lomotil  QID PRN as instructed.  She has not needed to take antidiarrhea.

## 2024-02-14 NOTE — Assessment & Plan Note (Signed)
 S/p skin nodule biopsy confirmed metastatic adenocarcinoma consistent with colorectal origin.  Refer to wound care

## 2024-02-14 NOTE — Assessment & Plan Note (Signed)
 Hb stable, observation

## 2024-02-14 NOTE — Assessment & Plan Note (Addendum)
 Pathological fracture, pain has improved.  S/p palliative radiation Consider future bisphosphonate treatments.- will wait until fracture heals.

## 2024-02-14 NOTE — Assessment & Plan Note (Signed)
 Stable symptoms, Grade 1-2.patient copes well.

## 2024-02-14 NOTE — Progress Notes (Signed)
 Hematology/Oncology Progress note [phone] [phone]      CHIEF COMPLAINTS/REASON FOR VISIT:  Follow up for rectal cancer  ASSESSMENT & PLAN:   Cancer Staging  Rectal cancer [facility]) Staging form: Colon and Rectum, AJCC 8th Edition - Pathologic stage from 10/06/2019: Stage IIC (ypT4b, pN0, cM0) - Signed by Timmy Forbes, MD on 10/06/2019 - Clinical stage from 06/30/2020: Jacqueline Yoder - Signed by Timmy Forbes, MD on 04/17/2022 - Pathologic: Stage Unknown (rpTX, pNX, cM1) - Signed by Timmy Forbes, MD on 01/31/2021   Rectal cancer [facility]) History of stage IIIC Rectal cancer, s/p TNT, followed by 09/17/19 APR/posterior vaginectomy/TAH/BSO/VY-flap, pT4b pN0 with close vaginal margin 0.2 mm.  Uterus and ovaries negative for malignancy. palliative radiation to vaginal recurrence- 01/19/21 recurrence with lung metastasis.-Palliative -FOLFIRI plus bevacizumab .  Irinotecan  was dropped in November 2022 due to side effects. Negative for UGT1A1*28 - radiographically stable, rise of CEA-July 2023 CT lung metastasis worse--> Dec 2023 PET showed progression in pelvic lymph nodes and bone lesions,--> 2nd line irinotecan  +panitumumab -->CT March 2024 stable. --> June 2024 CT showed mild lung progression/Progressive left iliac bone metastasis, vulvar mass difficult for biopsy in office,  Tempus Liquid biopsy - no actionable variants --> 06/25/2023 CT showed progression of lung nodules [SLD increase 45mm] and iliac inguinal lymphadenopathy--> 07/23/23 switch to dose reduced FOLFOX --> Dec 2024 CT- stable disease. --> Feb 2025 disease progression--> longsurf + Bevacizumab   Labs are reviewed and discussed with patient   CEA nadir was in 490, progressively increases - 10,193  Currently on longsurf [60mg  BID day 1-5 and day 8- 12 every 28-day  + Bevacizumab  D1 proceed with D15 Bevacizumab  Hold off  cycle 3  D1 Lngsuf and  Bevacizumab   Anemia due to antineoplastic chemotherapy Hb stable,  observation  Chemotherapy induced diarrhea continue lomotil   QID PRN as instructed.  She has not needed to take antidiarrhea.   Chemotherapy-induced neuropathy (HCC) Stable symptoms, Grade 1-2.patient copes well.   History of pulmonary embolism History of PE and bilateral lower extremity DVT  05/18/23 Repeat right LE US  showed chronic non occlusive DVT Continue  Eliquis  2.5 mg twice daily for anticoagulation prophylaxis. Recommend leg elevation and compression stocking.   Metastasis to skin [facility]) S/p skin nodule biopsy confirmed metastatic adenocarcinoma consistent with colorectal origin.  Refer to wound care  Proximal humerus fracture Pathological fracture, pain has improved.  S/p palliative radiation Consider future bisphosphonate treatments.- will wait until fracture heals.   Drug-induced neutropenia (HCC) ANC 0.6 Neutropenia precaution was discussed with patient.  Recommend Zarxio  480mcg daily x 2.  Plan to add Prophylactic Nneulasta to D15 treatment of future cycles.   Follow up 2 week[s].  All questions were answered. The patient knows to call the clinic with any problems, questions or concerns.  Timmy Forbes, MD, PhD [facility] Hematology Oncology 02/14/2024      HISTORY OF PRESENTING ILLNESS:  Oncology History  Rectal cancer [facility])  01/23/2019 Initial Diagnosis   Rectal cancer [facility]) Patient initially presented with complaints of postmenopausal bleeding on 08/16/2018.  History of was menopausal vaginal bleeding in 2016 which resulted in cervical polypectomy.  Pathology 02/04/2015 showed cervical polyp, consistent with benign endometrial polyp.  [name] after polypectomy due to anxiety associated with pelvic exams.  pelvic exam on 08/16/2018 reviewed cervical abnormality and from enlarged uterus. Seen by Dr. Denman Fischer on 10/29/2018.  Endometrial biopsy and a Pap smear was performed. 10/29/2018 Pap smear showed adenocarcinoma, favor endometrial origin. 10/29/2018  endometrial biopsy showed endometrioid  carcinoma, FIGO grade 1.  10/29/2018- TA & TV Ultrasound revealed: Anteverted uterus measuring 8.7 x 5.6 x 6.4 cm without evidence of focal masses.  The endometrium measuring 24.1 mm (thickened) and heterogeneous.  Right and left ovaries not visualized.  No adnexal masses identified.  No free fluid in cul-de-sac.  Patient was seen by Dr. Randalyn Bushman in clinic on 11/13/2018.  Cervical exam reveals 2 cm exophytic irregular mass consistent with malignancy.    11/19/2018 CT chest abdomen pelvis with contrast showed thickened endometrium with some irregularity compatible with the provided diagnosis of endometrial malignancy.  There is a mildly prominent left inguinal node 1.4 cm.  Patient was seen by Dr. Marella Shams on 11/20/2018 and left groin lymph node biopsy was recommended.  11/26/2018 patient underwent left inguinal lymph node biopsy. Pathology showed metastatic adenocarcinoma consistent with colorectal origin.  CDX 2+.  Case was discussed on tumor board.  Recommend colonoscopy for further evaluation.  Patient reports significant weight loss 30 pounds over the last year.  Chronic vaginal spotting. Change of bowel habits the past few months.  More constipated.  Family history positive for brother who has colon cancer prostate cancer.  patient has underwent colonoscopy on 12/03/2018 which reviewed a nonobstructing large mass in the rectum.  Also chronic fistula.  Mass was not circumferential.  This was biopsied with a cold forceps for histology.  Pathology came back hyperplastic polyp negative for dysplasia and malignancy. [name], patient underwent flex sigmoidoscopy on 12/06/2018 with rebiopsy of the rectal mass. This time biopsy results came back positive for invasive colorectal adenocarcinoma, moderately differentiated. Immunotherapy for nearly mismatch repair protein (MMR ) was performed.  There is no loss of MMR expression.  low  probability of MSI high.   # Seen by [name] for evaluation of resectability for rectal cancer. In addition, she also had a second opinion with [facility] where her endometrial biopsy pathology was changed to  adenocarcinoma, consistent with colorectal primary.   [name]. She has home health that has been assisting with ostomy care  Patient was also evaluated by [facility] oncology.  Recommendation is to proceed with TNT with concurrent chemoradiation followed by neoadjuvant chemotherapy followed by surgical resection. Patient prefers to have treatment done locally with [facility].   # Oncology Treatment:  02/03/2019- 03/19/2019  concurrent Xeloda  and radiation.  Xeloda  dose 825mg  /m2 BID - rounded to 1650mg  BID- on days of radiation. 04/09/2019, started on FOLFOX with bolus early.  Omitted.  07/16/2019 finished 8 cycles of FOLFOX. 09/17/19 APR/posterior vaginectomy/TAH/BSO/VY-flap pT4b pN0 with close vaginal margin 0.2 mm.  Uterus and ovaries negative for malignancy.   10/06/2019 Cancer Staging   Staging form: Colon and Rectum, AJCC 8th Edition - Pathologic stage from 10/06/2019: Stage IIC (ypT4b, pN0, cM0) - Signed by Timmy Forbes, MD on 10/06/2019   06/30/2020 Cancer Staging   Staging form: Colon and Rectum, AJCC 8th Edition - Clinical stage from 06/30/2020: Jacqueline Yoder - Signed by Timmy Forbes, MD on 04/17/2022 Stage prefix: Recurrence Total positive nodes: 1   06/30/2020 Relapse/Recurrence   vaginal introitus mass biopsied. Pathology is consistent with metastatic colorectal adenocarcinoma    07/20/2020 - 09/13/2020 Chemotherapy   Concurrent chemotherapy with Xeloda  with Radiation.  08/02/2020-08/16/2020, . Xeloda  was held due to neutropenia    08/17/2020 - 09/06/2020 Radiation Therapy   Xeloda  1500 mg twice daily concurrently with radiation    01/31/2021 Cancer Staging   Staging form: Colon and Rectum, AJCC  8th Edition - Pathologic: Stage Unknown (rpTX, pNX, cM1) -  Signed by Timmy Forbes, MD on 01/31/2021 Stage prefix: Recurrence   01/31/2021 - 08/20/2021 Chemotherapy   FOLFIRI + Bev  Irinotecan  dose was reduced, eventually 100mg /m2  Irinotecan  was dropped in November 2022 due to side effects. Negative for UGT1A1*28    09/13/2021 -  Chemotherapy   maintenance 5-FU/bevacizumab     11/28/2021 -  Chemotherapy   5-FU/Irinotecan /bevacizumab . Irinotecan  100 mg/m2 was added back due to progressively increasing CEA.    07/14/2022 Imaging   Bone scan  1. No evidence of skeletal metastatic disease in the hips. 2. Asymmetric increased activity in the RIGHT humeral head is favored degenerative as described above. If pain in the RIGHT shoulder consider further evaluation with plain film or MRI   07/17/2022 Imaging   1. Stable scattered small bilateral pulmonary nodules, largest 8 by 6 mm in the right middle lobe. 2. Subtle chronically stable posterior cortical irregularity and heterogeneity in the left iliac bone, not appreciable on the bone scan of 07/14/2022. This has been present at least over the past year and could reflect radiation therapy related findings although strictly speaking, malignant involvement of the left iliac bone is difficult to exclude. If clinically warranted this could be further assessed with nuclear medicine PET-CT or MRI of the bony pelvis with and without contrast. 3. Pelvic floor laxity with loops of small bowel extending 3 cm below the pubococcygeal line. 4. Aortic and systemic atherosclerosis as detailed above. Aortic Atherosclerosis    12/26/2022 Imaging   CT chest abdomen pelvis w contrast 1. Stable scattered solid pulmonary nodules. 2. Right inguinal and right pelvic lymph nodes which were hypermetabolic on prior PET-CT are stable. 3. Osseous lesion of the proximal right humerus demonstrates increased sclerosis when compared with the prior PET-CT, possibly due to treatment response. 4. Stable osseous irregularity of the left iliac  bone. 5. Aortic Atherosclerosis     06/25/2023 Imaging   CT chest abdomen pelvis w contrast showed 1. Increased size of the solid bilateral pulmonary nodules,consistent with worsening metastatic disease. 2. Increased size of the right external iliac and inguinal lymph nodes, concerning for worsening metastatic disease. 3. No significant interval change in the left iliac bone osseous metastasis. 4. Enlarged main pulmonary artery measuring 3.6 cm, which can be seen in the setting of pulmonary arterial hypertension. 5.  Aortic Atherosclerosis (ICD10-I70.0)     Miscellaneous   Tempus liquid biopsy showed no actionable variants.  NOTCH1 missense variant, BRCA2 missense variant , MYC missense variant   07/11/2023 Imaging   PET showed 1. Multifocal tracer avid abdominopelvic lymph nodes are identified compatible with metastatic disease. 2. Multifocal bilateral pulmonary nodules are identified compatible with pulmonary metastasis.  3. Diffuse increased radiotracer uptake throughout the marrow space is identified diminishing sensitivity for detecting osseous metastasis. No abnormal increased radiotracer uptake identified above background increased bone marrow activity to indicate metabolically active bone metastases. 4.  Aortic Atherosclerosis   07/23/2023 - 11/13/2023 Chemotherapy   switch to dose reduced FOLFOX    09/16/2023 Imaging   CT abdomen pelvis w contrast  1. Small-bowel obstruction with transition point in the anterior aspect of the lower abdomen, left of midline. This may be secondary to adhesions. 2. Small volume ascites. 3. Prior colon resection with left anterior abdominal wall colostomy. Adjacent parastomal hernia. Small volume fluid within the hernia sac. 4. Stable pulmonary metastatic disease. 5. Stable metastatic lesion of the left iliac wing. 6. Enlarged right external iliac chain lymph  node measuring 1.4 cm, previously 1.3 cm. Several mildly prominent retroperitoneal  lymph nodes have increased in size from prior but remain subcentimeter in size. 7. Aortic atherosclerosis (ICD10-I70.0).   09/16/2023 - 09/20/2023 Hospital Admission   Hospitalized due to Small bowel obstruction due to adhesions    09/24/2023 Imaging   1. Bilateral pulmonary metastases are all stable from 06/25/2023 chest CT. 2. No thoracic adenopathy. No new or progressive metastatic disease in the chest. 3. Trace bilateral posterior pleural effusions. 4. Two-vessel coronary atherosclerosis. 5. Dilated main pulmonary artery, suggesting pulmonary arterial hypertension. 6.  Aortic Atherosclerosis (ICD10-I70.0).   11/28/2023 Imaging   CT chest abdomen pelvis with contrast 1. Increased size of retroperitoneal, iliac side chain and inguinal lymph nodes, concerning for progressive nodal metastatic disease. 2. Numerous bilateral solid pulmonary nodules are again seen, some of which have increased in size and others are stable in size overall compatible with disease progression. 3. Increased size of the subcapsular lesion in the anterior inferior right lobe of the liver, which has been stable over multiple prior examinations and was not FDG avid on prior PET-CT. Suggest attention on follow-up imaging. 4. 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 5. Questionable wall thickening of matted loops of small bowel in the left lower quadrant, suggest attention on follow-up imaging. 6. Small volume abdominopelvic free fluid. 7.  Aortic Atherosclerosis (ICD10-I70.0).   12/13/2023 -  Chemotherapy   Patient is on Treatment Plan : COLORECTAL Bevacizumab  + Trifluridine /Tipiracil  q28d     01/01/2024 Procedure   Right inguinal crease skin nodule biopsy  Pathology showed dermal deposits of metastatic colon adenocarcinoma, extending to the edge and base.      01/14/2024 Imaging   PET scan showed  1. Progressive disease with progressive left  supraclavicular adenopathy, progressive pulmonary metastatic disease, progressive abdominal and pelvic adenopathy, and progressive multifocal skeletal involvement. 2. Substantial multifocal skeletal involvement including the right proximal humerus, right medial clavicle, left T11 vertebral body, L4 posterior elements, and entire left iliac crest. The permeative lesion of the right humerus may have some extraosseous extension. Similarly there is cortical breakthrough in the large left iliac crest lesion. 3. Hypermetabolic irregular tumor extends along the right inguinal ring region and right hip adductor musculature as well as in the subcutaneous and cutaneous tissues along the right inguinal fold, compatible with tumor involvement. 4. Right eccentric vulvar activity just below the clitoris has a maximum SUV of 7.5, and given the regional soft tissue metastatic disease, a metastatic focus in this vicinity is not excluded. 5. The subcapsular lesion along the anterior inferior right hepatic lobe described on CT of 11/28/2023 is not discernibly hypermetabolic or visible on today's PET-CT. 6. Descending left colostomy with mild diverticulosis. 7.  Aortic Atherosclerosis (ICD10-I70.0).    Metastatic adenocarcinoma (HCC)  12/13/2023 -  Chemotherapy   Patient is on Treatment Plan : COLORECTAL Bevacizumab  + Trifluridine /Tipiracil  q28d         INTERVAL HISTORY Jacqueline Yoder is a 78 y.o. female who has above history reviewed by me presents for follow-up of rectal cancer. She was accompanied by her daughter. + manageable neuropathy symptoms of her fingertips, intermittent,  worse when exposed to cold temperature.  + chronic loose ostomy output, stable, not worse.  + Right humerous fracture, pain has improved. She has not needed to take pain meds. . S/p palliative RT Weight is stable. No fever or chills.   Review of Systems  Constitutional:  Positive for fatigue.  Negative for appetite  change, chills, fever and unexpected weight change.  HENT:   Negative for hearing loss and voice change.   Eyes:  Negative for eye problems.  Respiratory:  Negative for chest tightness and cough.   Cardiovascular:  Negative for chest pain.  Gastrointestinal:  Positive for diarrhea. Negative for abdominal distention, abdominal pain, blood in stool, constipation and nausea.  Endocrine: Negative for hot flashes.  Genitourinary:  Negative for difficulty urinating and frequency.   Musculoskeletal:  Positive for arthralgias.        right shoulder pain.   Skin:  Positive for rash. Negative for itching.  Neurological:  Positive for numbness. Negative for extremity weakness.  Hematological:  Negative for adenopathy.  Psychiatric/Behavioral:  Negative for confusion.     MEDICAL HISTORY:  Past Medical History:  Diagnosis Date   Allergy    Arthritis    Blood clot in vein    Family history of colon cancer    GERD (gastroesophageal reflux disease)    Hypercholesteremia    Hypertension    Hypertension    Lower extremity edema    Personal history of chemotherapy    Rectal cancer (HCC) 12/2018   Urinary incontinence     SURGICAL HISTORY: Past Surgical History:  Procedure Laterality Date   ABDOMINAL HYSTERECTOMY     CHOLECYSTECTOMY  1971   COLONOSCOPY WITH PROPOFOL  N/A 12/03/2018   Procedure: COLONOSCOPY WITH PROPOFOL ;  Surgeon: Marnee Sink, MD;  Location: [facility] ENDOSCOPY;  Service: Endoscopy;  Laterality: N/A;   COLONOSCOPY WITH PROPOFOL  N/A 07/15/2020   Procedure: COLONOSCOPY WITH PROPOFOL ;  Surgeon: Luke Salaam, MD;  Location: [facility] ENDOSCOPY;  Service: Gastroenterology;  Laterality: N/A;   FLEXIBLE SIGMOIDOSCOPY N/A 12/06/2018   Procedure: FLEXIBLE SIGMOIDOSCOPY;  Surgeon: Luke Salaam, MD;  Location: [facility] ENDOSCOPY;  Service: Endoscopy;  Laterality: N/A;   LAPAROSCOPIC COLOSTOMY  01/06/2019   PORTACATH PLACEMENT N/A 04/03/2019   Procedure: INSERTION PORT-A-CATH;  Surgeon: Alben Alma,  MD;  Location: [facility] ORS;  Service: General;  Laterality: N/A;    SOCIAL HISTORY: Social History   Socioeconomic History   Marital status: Widowed    Spouse name: Not on file   Number of children: Not on file   Years of education: Not on file   Highest education level: Some college, no degree  Occupational History   Not on file  Tobacco Use   Smoking status: Former    Current packs/day: 0.00    Types: Cigarettes    Quit date: 12/02/1977    Years since quitting: 46.2   Smokeless tobacco: Former  Building services engineer status: Never Used  Substance and Sexual Activity   Alcohol use: Never   Drug use: Never   Sexual activity: Not Currently    Birth control/protection: None  Other Topics Concern   Not on file  Social History Narrative   Lives with daughter   Social Drivers of Health   Financial Resource Strain: Low Risk  (11/15/2023)   Overall Financial Resource Strain (CARDIA)    Difficulty of Paying Living Expenses: Not hard at all  Food Insecurity: No Food Insecurity (11/15/2023)   Hunger Vital Sign    Worried About Running Out of Food in the Last Year: Never true    Ran Out of Food in the Last Year: Never true  Transportation Needs: No Transportation Needs (11/15/2023)   PRAPARE - Administrator, Civil Service (Medical): No    Lack of Transportation (Non-Medical): No  Physical Activity:  Insufficiently Active (11/15/2023)   Exercise Vital Sign    Days of Exercise per Week: 3 days    Minutes of Exercise per Session: 10 min  Stress: No Stress Concern Present (11/15/2023)   [employer] of Occupational Health - Occupational Stress Questionnaire    Feeling of Stress : Not at all  Social Connections: Moderately Integrated (11/15/2023)   Social Connection and Isolation Panel [NHANES]    Frequency of Communication with Friends and Family: More than three times a week    Frequency of Social Gatherings with Friends and Family: More than three times a week     Attends Religious Services: More than 4 times per year    Active Member of [employer] or Organizations: Yes    Attends Banker Meetings: More than 4 times per year    Marital Status: Widowed  Intimate Partner Violence: Not At Risk (09/27/2023)   Humiliation, Afraid, Rape, and Kick questionnaire    Fear of Current or Ex-Partner: No    Emotionally Abused: No    Physically Abused: No    Sexually Abused: No    FAMILY HISTORY: Family History  Problem Relation Age of Onset   Colon cancer Brother 43       exposure to chemicals [place]   Hypertension Mother    Stroke Mother    Kidney failure Father    Breast cancer Neg Hx    Ovarian cancer Neg Hx     ALLERGIES:  is allergic to sulfamethoxazole-trimethoprim.  MEDICATIONS:  Current Outpatient Medications  Medication Sig Dispense Refill   acetaminophen  (TYLENOL ) 650 MG CR tablet Take 650 mg by mouth every 8 (eight) hours as needed for pain.     Cholecalciferol  (VITAMIN D3) 2000 units capsule Take 2,000 Units by mouth daily.     clindamycin  (CLINDAGEL) 1 % gel Apply topically 2 (two) times daily. 30 g 5   diclofenac  sodium (VOLTAREN ) 1 % GEL Apply 2 g topically 4 (four) times daily as needed (joint pain).  11   diphenoxylate -atropine  (LOMOTIL ) 2.5-0.025 MG tablet Take 1 tablet by mouth 4 (four) times daily as needed for diarrhea or loose stools. 120 tablet 0   fluticasone  (FLONASE ) 50 MCG/ACT nasal spray Place 1 spray into both nostrils daily as needed for allergies or rhinitis.     gentamicin  cream (GARAMYCIN ) 0.1 % Apply to affected toe once daily. 30 g 1   ketoconazole  (NIZORAL ) 2 % cream Apply 1 Application topically daily. For up to 2 weeks. May repeat if need. 30 g 2   lidocaine -prilocaine  (EMLA ) cream Apply 1 Application topically daily as needed. Apply small amount to port and cover with saran wrap 1-2 hours prior to port access 30 g 3   loratadine  (CLARITIN ) 10 MG tablet Take 10 mg by mouth daily.     magnesium  chloride  (SLOW-MAG) 64 MG TBEC SR tablet Take 1 tablet (64 mg total) by mouth 2 (two) times daily. 60 tablet 2   Multiple Vitamins-Minerals (ONE-A-DAY WOMENS 50 PLUS PO) Take 1 tablet by mouth daily.      nystatin  (MYCOSTATIN /NYSTOP ) powder APPLY 1 POWDER TOPICALLY THREE TIMES DAILY 45 g 0   ondansetron  (ZOFRAN ) 8 MG tablet Take 1 tablet (8 mg total) by mouth every 8 (eight) hours as needed for nausea or vomiting. 30 tablet 1   potassium chloride  SA (KLOR-CON  M) 20 MEQ tablet TAKE 1 TABLET BY MOUTH DAILY 100 tablet 2   prochlorperazine  (COMPAZINE ) 10 MG tablet Take 1 tablet (10 mg total) by  mouth every 6 (six) hours as needed (NAUSEA). 30 tablet 1   simvastatin  (ZOCOR ) 40 MG tablet Take 1 tablet (40 mg total) by mouth at bedtime. 100 tablet 3   tiZANidine  (ZANAFLEX ) 4 MG tablet Take 0.5 tablets (2 mg total) by mouth every 8 (eight) hours as needed for muscle spasms. 10 tablet 0   triamcinolone  cream (KENALOG ) 0.5 % Apply 1 Application topically 2 (two) times daily. 90 g 1   triamterene -hydrochlorothiazide  (DYAZIDE ) 37.5-25 MG capsule Take 1 each (1 capsule total) by mouth daily. 90 capsule 1   trifluridine -tipiracil  (LONSURF ) 20-8.19 MG tablet Take 3 tablets (60 mg of trifluridine  total) by mouth 2 (two) times daily. 1hr after AM & PM meals days 1-5, 8-12. Repeat every 28d. 60 tablet 1   zinc gluconate 50 MG tablet Take 50 mg by mouth daily.     apixaban  (ELIQUIS ) 2.5 MG TABS tablet Take 1 tablet (2.5 mg total) by mouth 2 (two) times daily. 200 tablet 2   No current facility-administered medications for this visit.   Facility-Administered Medications Ordered in Other Visits  Medication Dose Route Frequency Provider Last Rate Last Admin   filgrastim -sndz (ZARXIO ) injection 480 mcg  480 mcg Subcutaneous Once Timmy Forbes, MD       heparin  lock flush 100 UNIT/ML injection            heparin  lock flush 100 unit/mL  500 Units Intravenous Once Timmy Forbes, MD         PHYSICAL EXAMINATION: ECOG PERFORMANCE STATUS:  1 - Symptomatic but completely ambulatory  Physical Exam Constitutional:      General: She is not in acute distress. HENT:     Head: Normocephalic and atraumatic.  Eyes:     General: No scleral icterus. Cardiovascular:     Rate and Rhythm: Normal rate.     Heart sounds: Normal heart sounds.  Pulmonary:     Effort: Pulmonary effort is normal. No respiratory distress.     Breath sounds: No wheezing.  Abdominal:     General: Bowel sounds are normal. There is no distension.     Palpations: Abdomen is soft.     Comments: + Colostomy bag   Musculoskeletal:        General: No deformity. Normal range of motion.     Cervical back: Normal range of motion and neck supple.  Skin:    General: Skin is warm and dry.  Neurological:     Mental Status: She is alert and oriented to person, place, and time. Mental status is at baseline.       LABORATORY DATA:  I have reviewed the data as listed    Latest Ref Rng & Units 02/14/2024    7:57 AM 01/31/2024    8:38 AM 01/17/2024   10:42 AM  CBC  WBC 4.0 - 10.5 K/uL 2.1  3.0  5.2   Hemoglobin 12.0 - 15.0 g/dL 16.1  09.6  04.5   Hematocrit 36.0 - 46.0 % 33.0  32.8  33.5   Platelets 150 - 400 K/uL 192  190  309       Latest Ref Rng & Units 02/14/2024    7:57 AM 01/31/2024    8:38 AM 01/17/2024   10:42 AM  CMP  Glucose 70 - 99 mg/dL 409  811  914   BUN 8 - 23 mg/dL 23  16  18    Creatinine 0.44 - 1.00 mg/dL 7.82  9.56  2.13   Sodium 135 - 145 mmol/L  138  136  136   Potassium 3.5 - 5.1 mmol/L 3.6  3.9  3.7   Chloride 98 - 111 mmol/L 103  101  103   CO2 22 - 32 mmol/L 27  26  26    Calcium  8.9 - 10.3 mg/dL 9.0  9.3  9.1   Total Protein 6.5 - 8.1 g/dL 7.1  7.1  7.1   Total Bilirubin 0.0 - 1.2 mg/dL 0.4  0.5  0.3   Alkaline Phos 38 - 126 U/L 73  76  81   AST 15 - 41 U/L 31  31  40   ALT 0 - 44 U/L 18  18  23       RADIOGRAPHIC STUDIES: I have personally reviewed the radiological images as listed and agreed with the findings in the report. MM 3D  SCREENING MAMMOGRAM BILATERAL BREAST Result Date: 01/25/2024 CLINICAL DATA:  Screening. EXAM: DIGITAL SCREENING BILATERAL MAMMOGRAM WITH TOMOSYNTHESIS AND CAD TECHNIQUE: Bilateral screening digital craniocaudal and mediolateral oblique mammograms were obtained. Bilateral screening digital breast tomosynthesis was performed. The images were evaluated with computer-aided detection. COMPARISON:  Previous exam(s). ACR Breast Density Category b: There are scattered areas of fibroglandular density. FINDINGS: There are no findings suspicious for malignancy. IMPRESSION: No mammographic evidence of malignancy. A result letter of this screening mammogram will be mailed directly to the patient. RECOMMENDATION: Screening mammogram in one year. (Code:[id number]-B-01Y) BI-RADS CATEGORY  1: Negative. Electronically Signed   By: Dina  Arceo M.D.   On: 01/25/2024 07:45   NM PET Image Restag (PS) Skull Base To Thigh Result Date: 01/17/2024 CLINICAL DATA:  Subsequent treatment strategy for rectal cancer. EXAM: NUCLEAR MEDICINE PET SKULL BASE TO THIGH TECHNIQUE: 8.5 mCi F-18 FDG was injected intravenously. Full-ring PET imaging was performed from the skull base to thigh after the radiotracer. CT data was obtained and used for attenuation correction and anatomic localization. Fasting blood glucose: 81 mg/dl COMPARISON:  40/98/1191, and CT scan of 11/28/2023 FINDINGS: Mediastinal blood pool activity: SUV max 2.1 Liver activity: SUV max NA NECK: No significant abnormal hypermetabolic activity in this region. Incidental CT findings: None. CHEST: Compared to prior PET-CT there is progressive left supraclavicular adenopathy measuring up to 1.2 cm in short axis with maximum SUV of 13.4 (formerly 4.9). Numerous scattered pulmonary nodules throughout both lungs, index lesion in the right middle lobe measuring 1.6 by 1.4 cm on image 68 series 6 with maximum SUV 11.5 (previously 4.3). Many of the nodules are new compared to the prior PET-CT, these are  roughly similar in distribution to the CT chest from 11/28/2023. Incidental small lesion in the adipose tissues just above the left scapula, likely a lymph node, measuring 4 mm in short axis on image 35 series 6, maximum SUV 3.3. Incidental CT findings: Right Port-A-Cath tip: Right atrium. ABDOMEN/PELVIS: The subcapsular lesion along the anterior inferior right hepatic lobe described on CT of 11/28/2023 is not discernibly hypermetabolic or visible on today's PET-CT. Abnormal hypermetabolic retrocrural and retroperitoneal adenopathy along with hypermetabolic right common iliac, bilateral external iliac, and bilateral inguinal adenopathy. Index left periaortic lymph node on image 95 series 6 measures 1.4 cm in short axis with maximum SUV 12.8. Similar size on CT from 11/28/2023, back on 07/09/2023 this node measured 0.7 cm with maximum SUV 4.2. Hypermetabolic irregular tumor extends along the right inguinal ring region and right hip adductor musculature as well as in the subcutaneous and cutaneous tissues along the right inguinal fold, compatible with tumor involvement. The multifocal cutaneous involvement has  a maximum SUV of 10.1. Right eccentric vulvar activity just below the clitoris has a maximum SUV of 7.5, and given the regional soft tissue metastatic disease, a metastatic focus in this vicinity is not excluded. Incidental CT findings: Benign renal cysts. No further imaging workup of these lesions is indicated. Atherosclerosis is present, including aortoiliac atherosclerotic disease. Descending left colostomy noted with mild diverticulosis. SKELETON: Substantial multifocal skeletal involvement including the right proximal humerus, right medial clavicle, left T11 vertebral body, L4 posterior elements, and entire left iliac crest. The permeative lesion of the right humerus may have some extraosseous extension, maximum SUV 16.0. Similarly there is cortical breakthrough in the large left iliac crest lesion which  has a maximum SUV of 14.2. Incidental CT findings: None. IMPRESSION: 1. Progressive disease with progressive left supraclavicular adenopathy, progressive pulmonary metastatic disease, progressive abdominal and pelvic adenopathy, and progressive multifocal skeletal involvement. 2. Substantial multifocal skeletal involvement including the right proximal humerus, right medial clavicle, left T11 vertebral body, L4 posterior elements, and entire left iliac crest. The permeative lesion of the right humerus may have some extraosseous extension. Similarly there is cortical breakthrough in the large left iliac crest lesion. 3. Hypermetabolic irregular tumor extends along the right inguinal ring region and right hip adductor musculature as well as in the subcutaneous and cutaneous tissues along the right inguinal fold, compatible with tumor involvement. 4. Right eccentric vulvar activity just below the clitoris has a maximum SUV of 7.5, and given the regional soft tissue metastatic disease, a metastatic focus in this vicinity is not excluded. 5. The subcapsular lesion along the anterior inferior right hepatic lobe described on CT of 11/28/2023 is not discernibly hypermetabolic or visible on today's PET-CT. 6. Descending left colostomy with mild diverticulosis. 7.  Aortic Atherosclerosis (ICD10-I70.0). Electronically Signed   By: Freida Jes M.D.   On: 01/17/2024 09:48   DG Shoulder Right Result Date: 12/27/2023 CLINICAL DATA:  Chronic right shoulder pain without known injury. EXAM: RIGHT SHOULDER - 2+ VIEW COMPARISON:  None Available. FINDINGS: Possible nondisplaced fracture involving greater tuberosity. No dislocation is noted. Moderate degenerative changes are seen involving the right glenohumeral and acromioclavicular joints. Possible Hill-Sachs deformity suggesting prior dislocations. IMPRESSION: Possible nondisplaced fracture involving greater tuberosity of proximal humerus. Degenerative changes as noted above.  Electronically Signed   By: Rosalene Colon M.D.   On: 12/27/2023 09:51   VAS US  LOWER EXTREMITY VENOUS REFLUX Result Date: 12/21/2023  Lower Venous Reflux Study Patient Name:  DARALYNN DEVAUL  Date of Exam:   12/20/2023 Medical Rec #: [id number]       Accession #:    [id number] Date of Birth: 11-May-1946       Patient Gender: F Patient Age:   21 years Exam Location:  [facility] Procedure:      VAS US  LOWER EXTREMITY VENOUS REFLUX Referring Phys: Sharla Davis --------------------------------------------------------------------------------  Indications: Pain, Swelling, Edema, and rt knee.  Performing Technologist: Faustine Hoof RVT  Examination Guidelines: A complete evaluation includes B-mode imaging, spectral Doppler, color Doppler, and power Doppler as needed of all accessible portions of each vessel. Bilateral testing is considered an integral part of a complete examination. Limited examinations for reoccurring indications may be performed as noted. The reflux portion of the exam is performed with the patient in reverse Trendelenburg. Significant venous reflux is defined as >500 ms in the superficial venous system, and >1 second in the deep venous system.   Summary: Right: - No evidence of deep vein thrombosis  seen in the right lower extremity, from the common femoral through the popliteal veins. - No evidence of superficial venous thrombosis in the right lower extremity. - There is no evidence of venous reflux seen in the right lower extremity. - No evidence of superficial venous reflux seen in the right greater saphenous vein. - No evidence of superficial venous reflux seen in the right short saphenous vein.  *See table(s) above for measurements and observations. Electronically signed by Devon Fogo MD on 12/21/2023 at 8:33:40 AM.    Final    CT CHEST ABDOMEN PELVIS W CONTRAST Result Date: 11/28/2023 CLINICAL DATA:  Rectal cancer, follow-up.  * Tracking Code: BO * EXAM: CT CHEST, ABDOMEN, AND  PELVIS WITH CONTRAST TECHNIQUE: Multidetector CT imaging of the chest, abdomen and pelvis was performed following the standard protocol during bolus administration of intravenous contrast. RADIATION DOSE REDUCTION: This exam was performed according to the departmental dose-optimization program which includes automated exposure control, adjustment of the mA and/or kV according to patient size and/or use of iterative reconstruction technique. CONTRAST:  85mL OMNIPAQUE  IOHEXOL  300 MG/ML  SOLN COMPARISON:  Multiple priors including CT September 24, 2023 FINDINGS: CT CHEST FINDINGS Cardiovascular: Right chest Port-A-Cath with tip in the right atrium. Thoracic aortic atherosclerosis. Dilated main pulmonary artery. Coronary artery calcifications. Normal size heart. Mediastinum/Nodes: Prominent supraclavicular lymph nodes measure 9 mm on image 6/2 previously 7 mm. No pathologically enlarged mediastinal, hilar or axillary lymph nodes. No suspicious thyroid  nodule.  Tiny hiatal hernia. Lungs/Pleura: Numerous bilateral solid pulmonary nodules are again seen. Some of which have increased in size and others are stable in size. For reference:: -medial right middle lobe pulmonary nodule measures 18 x 16 mm on image 95/3 previously 16 x 14 mm. -anterior left lower lobe pulmonary nodule measures 7 mm on image 111/3, unchanged. -left upper lobe pulmonary nodule measures 9 mm on image 65/3 previously 7 mm. Musculoskeletal: No aggressive lytic or blastic lesion of bone. Multilevel degenerative change of the spine. CT ABDOMEN PELVIS FINDINGS Hepatobiliary: Increased size of the subcapsular lesion in the anterior inferior right lobe of the liver now measuring 10 mm on image 69/2 previously 8 mm. Gallbladder is surgically absent. No biliary ductal dilation. Pancreas: No pancreatic ductal dilation or evidence of acute inflammation. Spleen: No splenomegaly.  Stable splenic lymphangioma/cysts. Adrenals/Urinary Tract: No suspicious adrenal  nodule. Stable bilateral renal cysts. No hydronephrosis. Urinary bladder is unremarkable for degree of distension. Stomach/Bowel: Radiopaque enteric contrast material traverses the transverse colon. Stomach is unremarkable for degree of distension. No pathologic dilation of small or large bowel. Questionable wall thickening of matted loops of small bowel in the left lower quadrant on image 89/2. Prior partial colectomy and rectal resection with left anterior abdominal wall colostomy. Fluid and soft tissue stranding in the mesorectal/presacral space without discrete enhancing soft tissue nodularity. Vascular/Lymphatic: Aortic atherosclerosis. Increased size of retroperitoneal, iliac side chain and inguinal lymph nodes. For reference: Left periaortic lymph node measures 12 mm in short axis on image 66/2 previously 9 mm. -right external iliac lymph node measuring 18 mm in short axis on image 98/2 previously measured 14 mm -Right pelvic sidewall lymph node measures 10 mm in short axis on image 93/2 previously 7 mm. Reproductive: Uterus is surgically absent. Other: Small volume abdominopelvic free fluid. Postsurgical change in the abdominal wall. Musculoskeletal: Similar appearance of the permeative lytic metastatic lesion in the left iliac wing. No new aggressive lytic or blastic lesion of bone. Multilevel degenerative change of the spine. Demineralization of  bone. IMPRESSION: 1. Increased size of retroperitoneal, iliac side chain and inguinal lymph nodes, concerning for progressive nodal metastatic disease. 2. Numerous bilateral solid pulmonary nodules are again seen, some of which have increased in size and others are stable in size overall compatible with disease progression. 3. Increased size of the subcapsular lesion in the anterior inferior right lobe of the liver, which has been stable over multiple prior examinations and was not FDG avid on prior PET-CT. Suggest attention on follow-up imaging. 4. Similar  appearance of the permeative lytic metastatic lesion in the left iliac wing. 5. Questionable wall thickening of matted loops of small bowel in the left lower quadrant, suggest attention on follow-up imaging. 6. Small volume abdominopelvic free fluid. 7.  Aortic Atherosclerosis (ICD10-I70.0). Electronically Signed   By: Tama Fails M.D.   On: 11/28/2023 15:20   DG Bone Density Result Date: 11/19/2023 EXAM: DUAL X-RAY ABSORPTIOMETRY (DXA) FOR BONE MINERAL DENSITY IMPRESSION: Your patient Zema Forti completed a BMD test on 11/19/2023 using the [employer] DXA System (software version: 14.10) manufactured by [employer]. The following summarizes the results of our evaluation. Technologist:VLM PATIENT BIOGRAPHICAL: Name: Jeffery, Bruff Patient ID: [id number] Birth Date: 1945/11/28 Height: 64.0 in. Gender: Female Exam Date: 11/19/2023 Weight: 171.2 lbs. Indications: Chemo, Colon CA, Hysterectomy Fractures: Treatments: Vitamin D DENSITOMETRY RESULTS: Site      Region        Measured Date Measured Age WHO Classification Young Adult T-score BMD         %Change vs. Previous Significant Change (*) AP Spine L1-L4 (L2,L3) 11/19/2023 76.6 Normal 0.2 1.203 g/cm2 - - DualFemur Total Right 11/19/2023 76.6 Normal -0.5 0.946 g/cm2 - - ASSESSMENT: The BMD measured at Femur Total Right is 0.946 g/cm2 with a T-score of -0.5. This patient is considered normal according to World Health Organization [facility]) criteria. L-2 & L-3 were excluded due to degenerative changes. The scan quality is good. World Science writer [facility]) criteria for post-menopausal, Caucasian Women: Normal:                   T-score at or above -1 SD Osteopenia/low bone mass: T-score between -1 and -2.5 SD Osteoporosis:             T-score at or below -2.5 SD RECOMMENDATIONS: 1. All patients should optimize calcium  and vitamin D intake. 2. Consider FDA-approved medical therapies in postmenopausal women and men aged 46 years and older, based on the  following: a. A hip or vertebral(clinical or morphometric) fracture b. T-score < -2.5 at the femoral neck or spine after appropriate evaluation to exclude secondary causes c. Low bone mass (T-score between -1.0 and -2.5 at the femoral neck or spine) and a 10-year probability of a hip fracture > 3% or a 10-year probability of a major osteoporosis-related fracture > 20% based on the [place] -adapted WHO algorithm 3. Clinician judgment and/or patient preferences may indicate treatment for people with 10-year fracture probabilities above or below these levels FOLLOW-UP: People with diagnosed cases of osteoporosis or at high risk for fracture should have regular bone mineral density tests. For patients eligible for Medicare, routine testing is allowed once every 2 years. The testing frequency can be increased to one year for patients who have rapidly progressing disease, those who are receiving or discontinuing medical therapy to restore bone mass, or have additional risk factors. I have reviewed this report, and agree with the above findings. [facility] Electronically Signed   By: Moira Andrews  Hazeline Lister M.D.   On: 11/19/2023 10:00      MM 3D SCREENING MAMMOGRAM BILATERAL BREAST Result Date: 01/25/2024 CLINICAL DATA:  Screening. EXAM: DIGITAL SCREENING BILATERAL MAMMOGRAM WITH TOMOSYNTHESIS AND CAD TECHNIQUE: Bilateral screening digital craniocaudal and mediolateral oblique mammograms were obtained. Bilateral screening digital breast tomosynthesis was performed. The images were evaluated with computer-aided detection. COMPARISON:  Previous exam(s). ACR Breast Density Category b: There are scattered areas of fibroglandular density. FINDINGS: There are no findings suspicious for malignancy. IMPRESSION: No mammographic evidence of malignancy. A result letter of this screening mammogram will be mailed directly to the patient. RECOMMENDATION: Screening mammogram in one year. (Code:[id number]-B-01Y) BI-RADS CATEGORY  1:  Negative. Electronically Signed   By: Dina  Arceo M.D.   On: 01/25/2024 07:45   NM PET Image Restag (PS) Skull Base To Thigh Result Date: 01/17/2024 CLINICAL DATA:  Subsequent treatment strategy for rectal cancer. EXAM: NUCLEAR MEDICINE PET SKULL BASE TO THIGH TECHNIQUE: 8.5 mCi F-18 FDG was injected intravenously. Full-ring PET imaging was performed from the skull base to thigh after the radiotracer. CT data was obtained and used for attenuation correction and anatomic localization. Fasting blood glucose: 81 mg/dl COMPARISON:  40/98/1191, and CT scan of 11/28/2023 FINDINGS: Mediastinal blood pool activity: SUV max 2.1 Liver activity: SUV max NA NECK: No significant abnormal hypermetabolic activity in this region. Incidental CT findings: None. CHEST: Compared to prior PET-CT there is progressive left supraclavicular adenopathy measuring up to 1.2 cm in short axis with maximum SUV of 13.4 (formerly 4.9). Numerous scattered pulmonary nodules throughout both lungs, index lesion in the right middle lobe measuring 1.6 by 1.4 cm on image 68 series 6 with maximum SUV 11.5 (previously 4.3). Many of the nodules are new compared to the prior PET-CT, these are roughly similar in distribution to the CT chest from 11/28/2023. Incidental small lesion in the adipose tissues just above the left scapula, likely a lymph node, measuring 4 mm in short axis on image 35 series 6, maximum SUV 3.3. Incidental CT findings: Right Port-A-Cath tip: Right atrium. ABDOMEN/PELVIS: The subcapsular lesion along the anterior inferior right hepatic lobe described on CT of 11/28/2023 is not discernibly hypermetabolic or visible on today's PET-CT. Abnormal hypermetabolic retrocrural and retroperitoneal adenopathy along with hypermetabolic right common iliac, bilateral external iliac, and bilateral inguinal adenopathy. Index left periaortic lymph node on image 95 series 6 measures 1.4 cm in short axis with maximum SUV 12.8. Similar size on CT from  11/28/2023, back on 07/09/2023 this node measured 0.7 cm with maximum SUV 4.2. Hypermetabolic irregular tumor extends along the right inguinal ring region and right hip adductor musculature as well as in the subcutaneous and cutaneous tissues along the right inguinal fold, compatible with tumor involvement. The multifocal cutaneous involvement has a maximum SUV of 10.1. Right eccentric vulvar activity just below the clitoris has a maximum SUV of 7.5, and given the regional soft tissue metastatic disease, a metastatic focus in this vicinity is not excluded. Incidental CT findings: Benign renal cysts. No further imaging workup of these lesions is indicated. Atherosclerosis is present, including aortoiliac atherosclerotic disease. Descending left colostomy noted with mild diverticulosis. SKELETON: Substantial multifocal skeletal involvement including the right proximal humerus, right medial clavicle, left T11 vertebral body, L4 posterior elements, and entire left iliac crest. The permeative lesion of the right humerus may have some extraosseous extension, maximum SUV 16.0. Similarly there is cortical breakthrough in the large left iliac crest lesion which has a maximum SUV of 14.2. Incidental CT findings: None.  IMPRESSION: 1. Progressive disease with progressive left supraclavicular adenopathy, progressive pulmonary metastatic disease, progressive abdominal and pelvic adenopathy, and progressive multifocal skeletal involvement. 2. Substantial multifocal skeletal involvement including the right proximal humerus, right medial clavicle, left T11 vertebral body, L4 posterior elements, and entire left iliac crest. The permeative lesion of the right humerus may have some extraosseous extension. Similarly there is cortical breakthrough in the large left iliac crest lesion. 3. Hypermetabolic irregular tumor extends along the right inguinal ring region and right hip adductor musculature as well as in the subcutaneous and  cutaneous tissues along the right inguinal fold, compatible with tumor involvement. 4. Right eccentric vulvar activity just below the clitoris has a maximum SUV of 7.5, and given the regional soft tissue metastatic disease, a metastatic focus in this vicinity is not excluded. 5. The subcapsular lesion along the anterior inferior right hepatic lobe described on CT of 11/28/2023 is not discernibly hypermetabolic or visible on today's PET-CT. 6. Descending left colostomy with mild diverticulosis. 7.  Aortic Atherosclerosis (ICD10-I70.0). Electronically Signed   By: Freida Jes M.D.   On: 01/17/2024 09:48   DG Shoulder Right Result Date: 12/27/2023 CLINICAL DATA:  Chronic right shoulder pain without known injury. EXAM: RIGHT SHOULDER - 2+ VIEW COMPARISON:  None Available. FINDINGS: Possible nondisplaced fracture involving greater tuberosity. No dislocation is noted. Moderate degenerative changes are seen involving the right glenohumeral and acromioclavicular joints. Possible Hill-Sachs deformity suggesting prior dislocations. IMPRESSION: Possible nondisplaced fracture involving greater tuberosity of proximal humerus. Degenerative changes as noted above. Electronically Signed   By: Rosalene Colon M.D.   On: 12/27/2023 09:51   VAS US  LOWER EXTREMITY VENOUS REFLUX Result Date: 12/21/2023  Lower Venous Reflux Study Patient Name:  BRITTNE WEIGERT  Date of Exam:   12/20/2023 Medical Rec #: [id number]       Accession #:    [id number] Date of Birth: 08-03-1946       Patient Gender: F Patient Age:   48 years Exam Location:  [facility] Procedure:      VAS US  LOWER EXTREMITY VENOUS REFLUX Referring Phys: Sharla Davis --------------------------------------------------------------------------------  Indications: Pain, Swelling, Edema, and rt knee.  Performing Technologist: Faustine Hoof RVT  Examination Guidelines: A complete evaluation includes B-mode imaging, spectral Doppler, color Doppler, and power Doppler  as needed of all accessible portions of each vessel. Bilateral testing is considered an integral part of a complete examination. Limited examinations for reoccurring indications may be performed as noted. The reflux portion of the exam is performed with the patient in reverse Trendelenburg. Significant venous reflux is defined as >500 ms in the superficial venous system, and >1 second in the deep venous system.   Summary: Right: - No evidence of deep vein thrombosis seen in the right lower extremity, from the common femoral through the popliteal veins. - No evidence of superficial venous thrombosis in the right lower extremity. - There is no evidence of venous reflux seen in the right lower extremity. - No evidence of superficial venous reflux seen in the right greater saphenous vein. - No evidence of superficial venous reflux seen in the right short saphenous vein.  *See table(s) above for measurements and observations. Electronically signed by Devon Fogo MD on 12/21/2023 at 8:33:40 AM.    Final    CT CHEST ABDOMEN PELVIS W CONTRAST Result Date: 11/28/2023 CLINICAL DATA:  Rectal cancer, follow-up.  * Tracking Code: BO * EXAM: CT CHEST, ABDOMEN, AND PELVIS WITH CONTRAST TECHNIQUE: Multidetector CT imaging of  the chest, abdomen and pelvis was performed following the standard protocol during bolus administration of intravenous contrast. RADIATION DOSE REDUCTION: This exam was performed according to the departmental dose-optimization program which includes automated exposure control, adjustment of the mA and/or kV according to patient size and/or use of iterative reconstruction technique. CONTRAST:  85mL OMNIPAQUE  IOHEXOL  300 MG/ML  SOLN COMPARISON:  Multiple priors including CT September 24, 2023 FINDINGS: CT CHEST FINDINGS Cardiovascular: Right chest Port-A-Cath with tip in the right atrium. Thoracic aortic atherosclerosis. Dilated main pulmonary artery. Coronary artery calcifications. Normal size heart.  Mediastinum/Nodes: Prominent supraclavicular lymph nodes measure 9 mm on image 6/2 previously 7 mm. No pathologically enlarged mediastinal, hilar or axillary lymph nodes. No suspicious thyroid  nodule.  Tiny hiatal hernia. Lungs/Pleura: Numerous bilateral solid pulmonary nodules are again seen. Some of which have increased in size and others are stable in size. For reference:: -medial right middle lobe pulmonary nodule measures 18 x 16 mm on image 95/3 previously 16 x 14 mm. -anterior left lower lobe pulmonary nodule measures 7 mm on image 111/3, unchanged. -left upper lobe pulmonary nodule measures 9 mm on image 65/3 previously 7 mm. Musculoskeletal: No aggressive lytic or blastic lesion of bone. Multilevel degenerative change of the spine. CT ABDOMEN PELVIS FINDINGS Hepatobiliary: Increased size of the subcapsular lesion in the anterior inferior right lobe of the liver now measuring 10 mm on image 69/2 previously 8 mm. Gallbladder is surgically absent. No biliary ductal dilation. Pancreas: No pancreatic ductal dilation or evidence of acute inflammation. Spleen: No splenomegaly.  Stable splenic lymphangioma/cysts. Adrenals/Urinary Tract: No suspicious adrenal nodule. Stable bilateral renal cysts. No hydronephrosis. Urinary bladder is unremarkable for degree of distension. Stomach/Bowel: Radiopaque enteric contrast material traverses the transverse colon. Stomach is unremarkable for degree of distension. No pathologic dilation of small or large bowel. Questionable wall thickening of matted loops of small bowel in the left lower quadrant on image 89/2. Prior partial colectomy and rectal resection with left anterior abdominal wall colostomy. Fluid and soft tissue stranding in the mesorectal/presacral space without discrete enhancing soft tissue nodularity. Vascular/Lymphatic: Aortic atherosclerosis. Increased size of retroperitoneal, iliac side chain and inguinal lymph nodes. For reference: Left periaortic lymph node  measures 12 mm in short axis on image 66/2 previously 9 mm. -right external iliac lymph node measuring 18 mm in short axis on image 98/2 previously measured 14 mm -Right pelvic sidewall lymph node measures 10 mm in short axis on image 93/2 previously 7 mm. Reproductive: Uterus is surgically absent. Other: Small volume abdominopelvic free fluid. Postsurgical change in the abdominal wall. Musculoskeletal: Similar appearance of the permeative lytic metastatic lesion in the left iliac wing. No new aggressive lytic or blastic lesion of bone. Multilevel degenerative change of the spine. Demineralization of bone. IMPRESSION: 1. Increased size of retroperitoneal, iliac side chain and inguinal lymph nodes, concerning for progressive nodal metastatic disease. 2. Numerous bilateral solid pulmonary nodules are again seen, some of which have increased in size and others are stable in size overall compatible with disease progression. 3. Increased size of the subcapsular lesion in the anterior inferior right lobe of the liver, which has been stable over multiple prior examinations and was not FDG avid on prior PET-CT. Suggest attention on follow-up imaging. 4. 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 5. Questionable wall thickening of matted loops of small bowel in the left lower quadrant, suggest attention on follow-up imaging. 6. Small volume abdominopelvic free fluid. 7.  Aortic Atherosclerosis (  ICD10-I70.0). Electronically Signed   By: Tama Fails M.D.   On: 11/28/2023 15:20   DG Bone Density Result Date: 11/19/2023 EXAM: DUAL X-RAY ABSORPTIOMETRY (DXA) FOR BONE MINERAL DENSITY IMPRESSION: Your patient Karilyn Cruser completed a BMD test on 11/19/2023 using the [employer] DXA System (software version: 14.10) manufactured by [employer]. The following summarizes the results of our evaluation. Technologist:VLM PATIENT BIOGRAPHICAL: Name: Denayah, Sherrick Patient ID:  [id number] Birth Date: 1946/07/30 Height: 64.0 in. Gender: Female Exam Date: 11/19/2023 Weight: 171.2 lbs. Indications: Chemo, Colon CA, Hysterectomy Fractures: Treatments: Vitamin D DENSITOMETRY RESULTS: Site      Region        Measured Date Measured Age WHO Classification Young Adult T-score BMD         %Change vs. Previous Significant Change (*) AP Spine L1-L4 (L2,L3) 11/19/2123 76.6 Normal 0.2 1.203 g/cm2 - - DualFemur Total Right 11/19/2123 76.6 Normal -0.5 0.946 g/cm2 - - ASSESSMENT: The BMD measured at Femur Total Right is 0.946 g/cm2 with a T-score of -0.5. This patient is considered normal according to World Health Organization [facility]) criteria. L-2 & L-3 were excluded due to degenerative changes. The scan quality is good. World Science writer [facility]) criteria for post-menopausal, Caucasian Women: Normal:                   T-score at or above -1 SD Osteopenia/low bone mass: T-score between -1 and -2.5 SD Osteoporosis:             T-score at or below -2.5 SD RECOMMENDATIONS: 1. All patients should optimize calcium  and vitamin D intake. 2. Consider FDA-approved medical therapies in postmenopausal women and men aged 64 years and older, based on the following: a. A hip or vertebral(clinical or morphometric) fracture b. T-score < -2.5 at the femoral neck or spine after appropriate evaluation to exclude secondary causes c. Low bone mass (T-score between -1.0 and -2.5 at the femoral neck or spine) and a 10-year probability of a hip fracture > 3% or a 10-year probability of a major osteoporosis-related fracture > 20% based on the [place] -adapted WHO algorithm 3. Clinician judgment and/or patient preferences may indicate treatment for people with 10-year fracture probabilities above or below these levels FOLLOW-UP: People with diagnosed cases of osteoporosis or at high risk for fracture should have regular bone mineral density tests. For patients eligible for Medicare, routine testing is allowed once every 2 years. The  testing frequency can be increased to one year for patients who have rapidly progressing disease, those who are receiving or discontinuing medical therapy to restore bone mass, or have additional risk factors. I have reviewed this report, and agree with the above findings. [facility] Electronically Signed   By: Alinda Apley M.D.   On: 11/19/2023 10:00

## 2024-02-15 ENCOUNTER — Inpatient Hospital Stay

## 2024-02-15 VITALS — BP 143/48 | HR 97

## 2024-02-15 DIAGNOSIS — Z923 Personal history of irradiation: Secondary | ICD-10-CM | POA: Diagnosis not present

## 2024-02-15 DIAGNOSIS — K435 Parastomal hernia without obstruction or  gangrene: Secondary | ICD-10-CM | POA: Diagnosis not present

## 2024-02-15 DIAGNOSIS — C7802 Secondary malignant neoplasm of left lung: Secondary | ICD-10-CM | POA: Diagnosis not present

## 2024-02-15 DIAGNOSIS — S42209A Unspecified fracture of upper end of unspecified humerus, initial encounter for closed fracture: Secondary | ICD-10-CM | POA: Diagnosis not present

## 2024-02-15 DIAGNOSIS — Z800 Family history of malignant neoplasm of digestive organs: Secondary | ICD-10-CM | POA: Diagnosis not present

## 2024-02-15 DIAGNOSIS — C20 Malignant neoplasm of rectum: Secondary | ICD-10-CM | POA: Diagnosis not present

## 2024-02-15 DIAGNOSIS — Z5189 Encounter for other specified aftercare: Secondary | ICD-10-CM | POA: Diagnosis not present

## 2024-02-15 DIAGNOSIS — D6481 Anemia due to antineoplastic chemotherapy: Secondary | ICD-10-CM | POA: Diagnosis not present

## 2024-02-15 DIAGNOSIS — R197 Diarrhea, unspecified: Secondary | ICD-10-CM

## 2024-02-15 DIAGNOSIS — C7951 Secondary malignant neoplasm of bone: Secondary | ICD-10-CM | POA: Diagnosis not present

## 2024-02-15 DIAGNOSIS — I700 Atherosclerosis of aorta: Secondary | ICD-10-CM | POA: Diagnosis not present

## 2024-02-15 DIAGNOSIS — D702 Other drug-induced agranulocytosis: Secondary | ICD-10-CM | POA: Diagnosis not present

## 2024-02-15 DIAGNOSIS — E7800 Pure hypercholesterolemia, unspecified: Secondary | ICD-10-CM | POA: Diagnosis not present

## 2024-02-15 DIAGNOSIS — C792 Secondary malignant neoplasm of skin: Secondary | ICD-10-CM | POA: Diagnosis not present

## 2024-02-15 DIAGNOSIS — C7801 Secondary malignant neoplasm of right lung: Secondary | ICD-10-CM | POA: Diagnosis not present

## 2024-02-15 DIAGNOSIS — R188 Other ascites: Secondary | ICD-10-CM | POA: Diagnosis not present

## 2024-02-15 DIAGNOSIS — J90 Pleural effusion, not elsewhere classified: Secondary | ICD-10-CM | POA: Diagnosis not present

## 2024-02-15 DIAGNOSIS — Z9221 Personal history of antineoplastic chemotherapy: Secondary | ICD-10-CM | POA: Diagnosis not present

## 2024-02-15 DIAGNOSIS — Z5111 Encounter for antineoplastic chemotherapy: Secondary | ICD-10-CM | POA: Diagnosis not present

## 2024-02-15 DIAGNOSIS — K219 Gastro-esophageal reflux disease without esophagitis: Secondary | ICD-10-CM | POA: Diagnosis not present

## 2024-02-15 DIAGNOSIS — M810 Age-related osteoporosis without current pathological fracture: Secondary | ICD-10-CM | POA: Diagnosis not present

## 2024-02-15 DIAGNOSIS — Z86718 Personal history of other venous thrombosis and embolism: Secondary | ICD-10-CM | POA: Diagnosis not present

## 2024-02-15 DIAGNOSIS — R590 Localized enlarged lymph nodes: Secondary | ICD-10-CM | POA: Diagnosis not present

## 2024-02-15 DIAGNOSIS — T451X5A Adverse effect of antineoplastic and immunosuppressive drugs, initial encounter: Secondary | ICD-10-CM | POA: Diagnosis not present

## 2024-02-15 DIAGNOSIS — I10 Essential (primary) hypertension: Secondary | ICD-10-CM | POA: Diagnosis not present

## 2024-02-15 MED ORDER — FILGRASTIM-SNDZ 480 MCG/0.8ML IJ SOSY
480.0000 ug | PREFILLED_SYRINGE | Freq: Once | INTRAMUSCULAR | Status: AC
  Administered 2024-02-15: 480 ug via SUBCUTANEOUS
  Filled 2024-02-15: qty 0.8

## 2024-02-17 ENCOUNTER — Other Ambulatory Visit: Payer: Self-pay | Admitting: Oncology

## 2024-02-19 ENCOUNTER — Encounter: Payer: Self-pay | Admitting: Oncology

## 2024-02-20 ENCOUNTER — Encounter: Payer: Self-pay | Admitting: Oncology

## 2024-02-21 ENCOUNTER — Inpatient Hospital Stay: Admitting: Oncology

## 2024-02-21 ENCOUNTER — Encounter: Payer: Self-pay | Admitting: Oncology

## 2024-02-21 ENCOUNTER — Other Ambulatory Visit

## 2024-02-21 ENCOUNTER — Ambulatory Visit

## 2024-02-21 ENCOUNTER — Inpatient Hospital Stay

## 2024-02-21 ENCOUNTER — Ambulatory Visit: Admitting: Oncology

## 2024-02-21 VITALS — BP 132/84 | HR 88 | Temp 97.8°F | Resp 18 | Wt 155.5 lb

## 2024-02-21 DIAGNOSIS — C792 Secondary malignant neoplasm of skin: Secondary | ICD-10-CM | POA: Diagnosis not present

## 2024-02-21 DIAGNOSIS — Z5111 Encounter for antineoplastic chemotherapy: Secondary | ICD-10-CM

## 2024-02-21 DIAGNOSIS — C20 Malignant neoplasm of rectum: Secondary | ICD-10-CM | POA: Diagnosis not present

## 2024-02-21 DIAGNOSIS — Z5189 Encounter for other specified aftercare: Secondary | ICD-10-CM | POA: Diagnosis not present

## 2024-02-21 DIAGNOSIS — T451X5A Adverse effect of antineoplastic and immunosuppressive drugs, initial encounter: Secondary | ICD-10-CM

## 2024-02-21 DIAGNOSIS — Z86718 Personal history of other venous thrombosis and embolism: Secondary | ICD-10-CM | POA: Diagnosis not present

## 2024-02-21 DIAGNOSIS — Z800 Family history of malignant neoplasm of digestive organs: Secondary | ICD-10-CM | POA: Diagnosis not present

## 2024-02-21 DIAGNOSIS — K435 Parastomal hernia without obstruction or  gangrene: Secondary | ICD-10-CM | POA: Diagnosis not present

## 2024-02-21 DIAGNOSIS — Z9221 Personal history of antineoplastic chemotherapy: Secondary | ICD-10-CM | POA: Diagnosis not present

## 2024-02-21 DIAGNOSIS — G620 Drug-induced polyneuropathy: Secondary | ICD-10-CM

## 2024-02-21 DIAGNOSIS — R590 Localized enlarged lymph nodes: Secondary | ICD-10-CM | POA: Diagnosis not present

## 2024-02-21 DIAGNOSIS — C799 Secondary malignant neoplasm of unspecified site: Secondary | ICD-10-CM

## 2024-02-21 DIAGNOSIS — D6481 Anemia due to antineoplastic chemotherapy: Secondary | ICD-10-CM

## 2024-02-21 DIAGNOSIS — Z86711 Personal history of pulmonary embolism: Secondary | ICD-10-CM | POA: Diagnosis not present

## 2024-02-21 DIAGNOSIS — J90 Pleural effusion, not elsewhere classified: Secondary | ICD-10-CM | POA: Diagnosis not present

## 2024-02-21 DIAGNOSIS — M810 Age-related osteoporosis without current pathological fracture: Secondary | ICD-10-CM | POA: Diagnosis not present

## 2024-02-21 DIAGNOSIS — K219 Gastro-esophageal reflux disease without esophagitis: Secondary | ICD-10-CM | POA: Diagnosis not present

## 2024-02-21 DIAGNOSIS — C7951 Secondary malignant neoplasm of bone: Secondary | ICD-10-CM | POA: Diagnosis not present

## 2024-02-21 DIAGNOSIS — D702 Other drug-induced agranulocytosis: Secondary | ICD-10-CM | POA: Diagnosis not present

## 2024-02-21 DIAGNOSIS — S42209A Unspecified fracture of upper end of unspecified humerus, initial encounter for closed fracture: Secondary | ICD-10-CM | POA: Diagnosis not present

## 2024-02-21 DIAGNOSIS — I700 Atherosclerosis of aorta: Secondary | ICD-10-CM | POA: Diagnosis not present

## 2024-02-21 DIAGNOSIS — E7800 Pure hypercholesterolemia, unspecified: Secondary | ICD-10-CM | POA: Diagnosis not present

## 2024-02-21 DIAGNOSIS — Z923 Personal history of irradiation: Secondary | ICD-10-CM | POA: Diagnosis not present

## 2024-02-21 DIAGNOSIS — C7802 Secondary malignant neoplasm of left lung: Secondary | ICD-10-CM | POA: Diagnosis not present

## 2024-02-21 DIAGNOSIS — C7801 Secondary malignant neoplasm of right lung: Secondary | ICD-10-CM | POA: Diagnosis not present

## 2024-02-21 DIAGNOSIS — R188 Other ascites: Secondary | ICD-10-CM | POA: Diagnosis not present

## 2024-02-21 DIAGNOSIS — I10 Essential (primary) hypertension: Secondary | ICD-10-CM | POA: Diagnosis not present

## 2024-02-21 LAB — CBC WITH DIFFERENTIAL (CANCER CENTER ONLY)
Abs Immature Granulocytes: 0.11 10*3/uL — ABNORMAL HIGH (ref 0.00–0.07)
Basophils Absolute: 0 10*3/uL (ref 0.0–0.1)
Basophils Relative: 1 %
Eosinophils Absolute: 0 10*3/uL (ref 0.0–0.5)
Eosinophils Relative: 1 %
HCT: 37 % (ref 36.0–46.0)
Hemoglobin: 11.7 g/dL — ABNORMAL LOW (ref 12.0–15.0)
Immature Granulocytes: 3 %
Lymphocytes Relative: 34 %
Lymphs Abs: 1.2 10*3/uL (ref 0.7–4.0)
MCH: 31.6 pg (ref 26.0–34.0)
MCHC: 31.6 g/dL (ref 30.0–36.0)
MCV: 100 fL (ref 80.0–100.0)
Monocytes Absolute: 0.6 10*3/uL (ref 0.1–1.0)
Monocytes Relative: 17 %
Neutro Abs: 1.6 10*3/uL — ABNORMAL LOW (ref 1.7–7.7)
Neutrophils Relative %: 44 %
Platelet Count: 176 10*3/uL (ref 150–400)
RBC: 3.7 MIL/uL — ABNORMAL LOW (ref 3.87–5.11)
RDW: 18.6 % — ABNORMAL HIGH (ref 11.5–15.5)
WBC Count: 3.6 10*3/uL — ABNORMAL LOW (ref 4.0–10.5)
nRBC: 0.6 % — ABNORMAL HIGH (ref 0.0–0.2)

## 2024-02-21 LAB — CMP (CANCER CENTER ONLY)
ALT: 21 U/L (ref 0–44)
AST: 39 U/L (ref 15–41)
Albumin: 3.8 g/dL (ref 3.5–5.0)
Alkaline Phosphatase: 75 U/L (ref 38–126)
Anion gap: 9 (ref 5–15)
BUN: 15 mg/dL (ref 8–23)
CO2: 26 mmol/L (ref 22–32)
Calcium: 9.4 mg/dL (ref 8.9–10.3)
Chloride: 104 mmol/L (ref 98–111)
Creatinine: 0.86 mg/dL (ref 0.44–1.00)
GFR, Estimated: >60 mL/min (ref >60)
Glucose, Bld: 113 mg/dL — ABNORMAL HIGH (ref 70–99)
Potassium: 3.9 mmol/L (ref 3.5–5.1)
Sodium: 139 mmol/L (ref 135–145)
Total Bilirubin: 0.5 mg/dL (ref 0.0–1.2)
Total Protein: 7.2 g/dL (ref 6.5–8.1)

## 2024-02-21 MED ORDER — SODIUM CHLORIDE 0.9 % IV SOLN: 5.0000 mg/kg | Freq: Once | INTRAVENOUS | Status: DC

## 2024-02-21 MED ORDER — HEPARIN SOD (PORK) LOCK FLUSH 100 UNIT/ML IV SOLN
500.0000 [IU] | Freq: Once | INTRAVENOUS | Status: AC | PRN
  Administered 2024-02-21: 500 [IU]
  Filled 2024-02-21: qty 5

## 2024-02-21 MED ORDER — SODIUM CHLORIDE 0.9% FLUSH
10.0000 mL | INTRAVENOUS | Status: DC | PRN
  Administered 2024-02-21: 10 mL
  Filled 2024-02-21: qty 10

## 2024-02-21 MED ORDER — SODIUM CHLORIDE 0.9 % IV SOLN
INTRAVENOUS | Status: DC
  Filled 2024-02-21: qty 250

## 2024-02-21 MED ORDER — SODIUM CHLORIDE 0.9 % IV SOLN
5.0000 mg/kg | Freq: Once | INTRAVENOUS | Status: AC
  Administered 2024-02-21: 350 mg via INTRAVENOUS
  Filled 2024-02-21: qty 14

## 2024-02-21 NOTE — Assessment & Plan Note (Signed)
 Resolved.  add Prophylactic Nneulasta to D15 treatment of future cycles.

## 2024-02-21 NOTE — Assessment & Plan Note (Signed)
 Hb stable, observation

## 2024-02-21 NOTE — Progress Notes (Signed)
 Mvasi  dose changed to 350mg  on subsequent cycles to  reflect new treatment wt of 70.5 kg

## 2024-02-21 NOTE — Assessment & Plan Note (Addendum)
 History of stage IIIC Rectal cancer, s/p TNT, followed by 09/17/19 APR/posterior vaginectomy/TAH/BSO/VY-flap, pT4b pN0 with close vaginal margin 0.2 mm.  Uterus and ovaries negative for malignancy. palliative radiation to vaginal recurrence- 01/19/21 recurrence with lung metastasis.-Palliative -FOLFIRI plus bevacizumab .  Irinotecan  was dropped in November 2022 due to side effects. Negative for UGT1A1*28 - radiographically stable, rise of CEA-July 2023 CT lung metastasis worse--> Dec 2023 PET showed progression in pelvic lymph nodes and bone lesions,--> 2nd line irinotecan  +panitumumab -->CT March 2024 stable. --> June 2024 CT showed mild lung progression/Progressive left iliac bone metastasis, vulvar mass difficult for biopsy in office,  Tempus Liquid biopsy - no actionable variants --> 06/25/2023 CT showed progression of lung nodules [SLD increase 65mm] and iliac inguinal lymphadenopathy--> 07/23/23 switch to dose reduced FOLFOX --> Dec 2024 CT- stable disease. --> Feb 2025 disease progression--> longsurf + Bevacizumab   Labs are reviewed and discussed with patient   CEA nadir was in 490, progressively increases - 10,193  Currently on longsurf [60mg  BID day 1-5 and day 8- 12 every 28-day  + Bevacizumab  D1 proceed with D15 Bevacizumab  Proceed with cycle 3  D1 Longsuf and  Bevacizumab 

## 2024-02-21 NOTE — Progress Notes (Signed)
 Hematology/Oncology Progress note [phone] [phone]      CHIEF COMPLAINTS/REASON FOR VISIT:  Follow up for rectal cancer  ASSESSMENT & PLAN:   Cancer Staging  Rectal cancer [facility]) Staging form: Colon and Rectum, AJCC 8th Edition - Pathologic stage from 10/06/2019: Stage IIC (ypT4b, pN0, cM0) - Signed by Timmy Forbes, MD on 10/06/2019 - Clinical stage from 06/30/2020: [name] - Signed by Timmy Forbes, MD on 04/17/2022 - Pathologic: Stage Unknown (rpTX, pNX, cM1) - Signed by Timmy Forbes, MD on 01/31/2021   Rectal cancer [facility]) History of stage IIIC Rectal cancer, s/p TNT, followed by 09/17/19 APR/posterior vaginectomy/TAH/BSO/VY-flap, pT4b pN0 with close vaginal margin 0.2 mm.  Uterus and ovaries negative for malignancy. palliative radiation to vaginal recurrence- 01/19/21 recurrence with lung metastasis.-Palliative -FOLFIRI plus bevacizumab .  Irinotecan  was dropped in November 2022 due to side effects. Negative for UGT1A1*28 - radiographically stable, rise of CEA-July 2023 CT lung metastasis worse--> Dec 2023 PET showed progression in pelvic lymph nodes and bone lesions,--> 2nd line irinotecan  +panitumumab -->CT March 2024 stable. --> June 2024 CT showed mild lung progression/Progressive left iliac bone metastasis, vulvar mass difficult for biopsy in office,  Tempus Liquid biopsy - no actionable variants --> 06/25/2023 CT showed progression of lung nodules [SLD increase 48mm] and iliac inguinal lymphadenopathy--> 07/23/23 switch to dose reduced FOLFOX --> Dec 2024 CT- stable disease. --> Feb 2025 disease progression--> longsurf + Bevacizumab   Labs are reviewed and discussed with patient   CEA nadir was in 490, progressively increases - 10,193  Currently on longsurf [60mg  BID day 1-5 and day 8- 12 every 28-day  + Bevacizumab  D1 proceed with D15 Bevacizumab  Proceed with cycle 3  D1 Longsuf and  Bevacizumab   Anemia due to antineoplastic chemotherapy Hb stable,  observation  Chemotherapy-induced neuropathy (HCC) Stable symptoms, Grade 1-2.patient copes well.   Drug-induced neutropenia (HCC) Resolved.  add Prophylactic Nneulasta to D15 treatment of future cycles.   Encounter for antineoplastic chemotherapy Chemotherapy plan as listed above.   History of pulmonary embolism History of PE and bilateral lower extremity DVT  Continue  Eliquis  2.5 mg twice daily for anticoagulation prophylaxis. Recommend leg elevation and compression stocking.   Metastasis to skin [facility]) S/p skin nodule biopsy confirmed metastatic adenocarcinoma consistent with colorectal origin.  Follow up with wound care  Follow up 2 week[s].  All questions were answered. The patient knows to call the clinic with any problems, questions or concerns.  Timmy Forbes, MD, PhD [facility] Hematology Oncology 02/21/2024      HISTORY OF PRESENTING ILLNESS:  Oncology History  Rectal cancer [facility])  01/23/2019 Initial Diagnosis   Rectal cancer [facility]) Patient initially presented with complaints of postmenopausal bleeding on 08/16/2018.  History of was menopausal vaginal bleeding in 2016 which resulted in cervical polypectomy.  Pathology 02/04/2015 showed cervical polyp, consistent with benign endometrial polyp.  [name] after polypectomy due to anxiety associated with pelvic exams.  pelvic exam on 08/16/2018 reviewed cervical abnormality and from enlarged uterus. Seen by Dr. Denman Fischer on 10/29/2018.  Endometrial biopsy and a Pap smear was performed. 10/29/2018 Pap smear showed adenocarcinoma, favor endometrial origin. 10/29/2018 endometrial biopsy showed endometrioid carcinoma, FIGO grade 1.  10/29/2018- TA & TV Ultrasound revealed: Anteverted uterus measuring 8.7 x 5.6 x 6.4 cm without evidence of focal masses.  The endometrium measuring 24.1 mm (thickened) and heterogeneous.  Right and left ovaries not visualized.  No adnexal masses identified.  No free fluid in cul-de-sac.  Patient  was seen by Dr.  Secord in clinic on 11/13/2018.  Cervical exam reveals 2 cm exophytic irregular mass consistent with malignancy.    11/19/2018 CT chest abdomen pelvis with contrast showed thickened endometrium with some irregularity compatible with the provided diagnosis of endometrial malignancy.  There is a mildly prominent left inguinal node 1.4 cm.  Patient was seen by Dr. Marella Shams on 11/20/2018 and left groin lymph node biopsy was recommended.  11/26/2018 patient underwent left inguinal lymph node biopsy. Pathology showed metastatic adenocarcinoma consistent with colorectal origin.  CDX 2+.  Case was discussed on tumor board.  Recommend colonoscopy for further evaluation.  Patient reports significant weight loss 30 pounds over the last year.  Chronic vaginal spotting. Change of bowel habits the past few months.  More constipated.  Family history positive for brother who has colon cancer prostate cancer.  patient has underwent colonoscopy on 12/03/2018 which reviewed a nonobstructing large mass in the rectum.  Also chronic fistula.  Mass was not circumferential.  This was biopsied with a cold forceps for histology.  Pathology came back hyperplastic polyp negative for dysplasia and malignancy. [name], patient underwent flex sigmoidoscopy on 12/06/2018 with rebiopsy of the rectal mass. This time biopsy results came back positive for invasive colorectal adenocarcinoma, moderately differentiated. Immunotherapy for nearly mismatch repair protein (MMR ) was performed.  There is no loss of MMR expression.  low probability of MSI high.   # Seen by [name] for evaluation of resectability for rectal cancer. In addition, she also had a second opinion with [facility] pathology where her endometrial biopsy pathology was changed to  adenocarcinoma, consistent with colorectal primary.   [name]. She has home health that has been assisting with ostomy  care  Patient was also evaluated by [facility] oncology.  Recommendation is to proceed with TNT with concurrent chemoradiation followed by neoadjuvant chemotherapy followed by surgical resection. Patient prefers to have treatment done locally with [facility].   # Oncology Treatment:  02/03/2019- 03/19/2019  concurrent Xeloda  and radiation.  Xeloda  dose 825mg  /m2 BID - rounded to 1650mg  BID- on days of radiation. 04/09/2019, started on FOLFOX with bolus early.  Omitted.  07/16/2019 finished 8 cycles of FOLFOX. 09/17/19 APR/posterior vaginectomy/TAH/BSO/VY-flap pT4b pN0 with close vaginal margin 0.2 mm.  Uterus and ovaries negative for malignancy.   10/06/2019 Cancer Staging   Staging form: Colon and Rectum, AJCC 8th Edition - Pathologic stage from 10/06/2019: Stage IIC (ypT4b, pN0, cM0) - Signed by Timmy Forbes, MD on 10/06/2019   06/30/2020 Cancer Staging   Staging form: Colon and Rectum, AJCC 8th Edition - Clinical stage from 06/30/2020: [name] - Signed by Timmy Forbes, MD on 04/17/2022 Stage prefix: Recurrence Total positive nodes: 1   06/30/2020 Relapse/Recurrence   vaginal introitus mass biopsied. Pathology is consistent with metastatic colorectal adenocarcinoma    07/20/2020 - 09/13/2020 Chemotherapy   Concurrent chemotherapy with Xeloda  with Radiation.  08/02/2020-08/16/2020, . Xeloda  was held due to neutropenia    08/17/2020 - 09/06/2020 Radiation Therapy   Xeloda  1500 mg twice daily concurrently with radiation    01/31/2021 Cancer Staging   Staging form: Colon and Rectum, AJCC 8th Edition - Pathologic: Stage Unknown (rpTX, pNX, cM1) - Signed by Timmy Forbes, MD on 01/31/2021 Stage prefix: Recurrence   01/31/2021 - 08/20/2021 Chemotherapy   FOLFIRI + Bev  Irinotecan  dose was reduced, eventually 100mg /m2  Irinotecan  was dropped in November 2022 due to side effects. Negative for UGT1A1*28    09/13/2021 -  Chemotherapy   maintenance 5-FU/bevacizumab     11/28/2021 -  Chemotherapy    5-FU/Irinotecan /bevacizumab . Irinotecan  100 mg/m2 was added back due to progressively increasing CEA.    07/14/2022 Imaging   Bone scan  1. No evidence of skeletal metastatic disease in the hips. 2. Asymmetric increased activity in the RIGHT humeral head is favored degenerative as described above. If pain in the RIGHT shoulder consider further evaluation with plain film or MRI   07/17/2022 Imaging   1. Stable scattered small bilateral pulmonary nodules, largest 8 by 6 mm in the right middle lobe. 2. Subtle chronically stable posterior cortical irregularity and heterogeneity in the left iliac bone, not appreciable on the bone scan of 07/14/2022. This has been present at least over the past year and could reflect radiation therapy related findings although strictly speaking, malignant involvement of the left iliac bone is difficult to exclude. If clinically warranted this could be further assessed with nuclear medicine PET-CT or MRI of the bony pelvis with and without contrast. 3. Pelvic floor laxity with loops of small bowel extending 3 cm below the pubococcygeal line. 4. Aortic and systemic atherosclerosis as detailed above. Aortic Atherosclerosis    12/26/2022 Imaging   CT chest abdomen pelvis w contrast 1. Stable scattered solid pulmonary nodules. 2. Right inguinal and right pelvic lymph nodes which were hypermetabolic on prior PET-CT are stable. 3. Osseous lesion of the proximal right humerus demonstrates increased sclerosis when compared with the prior PET-CT, possibly due to treatment response. 4. 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 5. Aortic Atherosclerosis     06/25/2023 Imaging   CT chest abdomen pelvis w contrast showed 1. Increased size of the solid bilateral pulmonary nodules,consistent with worsening metastatic disease. 2. Increased size of the right external iliac and inguinal lymph nodes, concerning for worsening metastatic disease. 3. No significant interval change  in the left iliac bone osseous metastasis. 4. Enlarged main pulmonary artery measuring 3.6 cm, which can be seen in the setting of pulmonary arterial hypertension. 5.  Aortic Atherosclerosis (ICD10-I70.0)     Miscellaneous   Tempus liquid biopsy showed no actionable variants.  NOTCH1 missense variant, BRCA2 missense variant , MYC missense variant   07/11/2023 Imaging   PET showed 1. Multifocal tracer avid abdominopelvic lymph nodes are identified compatible with metastatic disease. 2. Multifocal bilateral pulmonary nodules are identified compatible with pulmonary metastasis.  3. Diffuse increased radiotracer uptake throughout the marrow space is identified diminishing sensitivity for detecting osseous metastasis. No abnormal increased radiotracer uptake identified above background increased bone marrow activity to indicate metabolically active bone metastases. 4.  Aortic Atherosclerosis   07/23/2023 - 11/13/2023 Chemotherapy   switch to dose reduced FOLFOX    09/16/2023 Imaging   CT abdomen pelvis w contrast  1. Small-bowel obstruction with transition point in the anterior aspect of the lower abdomen, left of midline. This may be secondary to adhesions. 2. Small volume ascites. 3. Prior colon resection with left anterior abdominal wall colostomy. Adjacent parastomal hernia. Small volume fluid within the hernia sac. 4. Stable pulmonary metastatic disease. 5. Stable metastatic lesion of the left iliac wing. 6. Enlarged right external iliac chain lymph node measuring 1.4 cm, previously 1.3 cm. Several mildly prominent retroperitoneal lymph nodes have increased in size from prior but remain subcentimeter in size. 7. Aortic atherosclerosis (ICD10-I70.0).   09/16/2023 - 09/20/2023 Hospital Admission   Hospitalized due to Small bowel obstruction due to adhesions    09/24/2023 Imaging   1. Bilateral pulmonary metastases are  all stable from 06/25/2023 chest CT. 2. No thoracic adenopathy. No  new or progressive metastatic disease in the chest. 3. Trace bilateral posterior pleural effusions. 4. Two-vessel coronary atherosclerosis. 5. Dilated main pulmonary artery, suggesting pulmonary arterial hypertension. 6.  Aortic Atherosclerosis (ICD10-I70.0).   11/28/2023 Imaging   CT chest abdomen pelvis with contrast 1. Increased size of retroperitoneal, iliac side chain and inguinal lymph nodes, concerning for progressive nodal metastatic disease. 2. Numerous bilateral solid pulmonary nodules are again seen, some of which have increased in size and others are stable in size overall compatible with disease progression. 3. Increased size of the subcapsular lesion in the anterior inferior right lobe of the liver, which has been stable over multiple prior examinations and was not FDG avid on prior PET-CT. Suggest attention on follow-up imaging. 4. 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 5. Questionable wall thickening of matted loops of small bowel in the left lower quadrant, suggest attention on follow-up imaging. 6. Small volume abdominopelvic free fluid. 7.  Aortic Atherosclerosis (ICD10-I70.0).   12/13/2023 -  Chemotherapy   Patient is on Treatment Plan : COLORECTAL Bevacizumab  + Trifluridine /Tipiracil  q28d     01/01/2024 Procedure   Right inguinal crease skin nodule biopsy  Pathology showed dermal deposits of metastatic colon adenocarcinoma, extending to the edge and base.      01/14/2024 Imaging   PET scan showed  1. Progressive disease with progressive left supraclavicular adenopathy, progressive pulmonary metastatic disease, progressive abdominal and pelvic adenopathy, and progressive multifocal skeletal involvement. 2. Substantial multifocal skeletal involvement including the right proximal humerus, right medial clavicle, left T11 vertebral body, L4 posterior elements, and entire left iliac crest. The permeative lesion of  the right humerus may have some extraosseous extension. Similarly there is cortical breakthrough in the large left iliac crest lesion. 3. Hypermetabolic irregular tumor extends along the right inguinal ring region and right hip adductor musculature as well as in the subcutaneous and cutaneous tissues along the right inguinal fold, compatible with tumor involvement. 4. Right eccentric vulvar activity just below the clitoris has a maximum SUV of 7.5, and given the regional soft tissue metastatic disease, a metastatic focus in this vicinity is not excluded. 5. The subcapsular lesion along the anterior inferior right hepatic lobe described on CT of 11/28/2023 is not discernibly hypermetabolic or visible on today's PET-CT. 6. Descending left colostomy with mild diverticulosis. 7.  Aortic Atherosclerosis (ICD10-I70.0).    Metastatic adenocarcinoma (HCC)  12/13/2023 -  Chemotherapy   Patient is on Treatment Plan : COLORECTAL Bevacizumab  + Trifluridine /Tipiracil  q28d         INTERVAL HISTORY Jacqueline Yoder is a 78 y.o. female who has above history reviewed by me presents for follow-up of rectal cancer. She was accompanied by her daughter. + manageable neuropathy symptoms of her fingertips, intermittent,  worse when exposed to cold temperature.  + chronic loose ostomy output, stable, not worse.  + Right humerus fracture, pain has improved. . S/p palliative RT She has not needed to take pain meds. Weight is stable. No fever or chills.   Review of Systems  Constitutional:  Positive for fatigue. Negative for appetite change, chills, fever and unexpected weight change.  HENT:   Negative for hearing loss and voice change.   Eyes:  Negative for eye problems.  Respiratory:  Negative for chest tightness and cough.   Cardiovascular:  Negative for chest pain.  Gastrointestinal:  Positive for diarrhea. Negative for abdominal distention, abdominal pain,  blood in stool, constipation and nausea.   Endocrine: Negative for hot flashes.  Genitourinary:  Negative for difficulty urinating and frequency.   Musculoskeletal:  Positive for arthralgias.        right shoulder pain.   Skin:  Positive for rash. Negative for itching.  Neurological:  Positive for numbness. Negative for extremity weakness.  Hematological:  Negative for adenopathy.  Psychiatric/Behavioral:  Negative for confusion.     MEDICAL HISTORY:  Past Medical History:  Diagnosis Date   Allergy    Arthritis    Blood clot in vein    Family history of colon cancer    GERD (gastroesophageal reflux disease)    Hypercholesteremia    Hypertension    Hypertension    Lower extremity edema    Personal history of chemotherapy    Rectal cancer (HCC) 12/2018   Urinary incontinence     SURGICAL HISTORY: Past Surgical History:  Procedure Laterality Date   ABDOMINAL HYSTERECTOMY     CHOLECYSTECTOMY  1971   COLONOSCOPY WITH PROPOFOL  N/A 12/03/2018   Procedure: COLONOSCOPY WITH PROPOFOL ;  Surgeon: Marnee Sink, MD;  Location: [facility] ENDOSCOPY;  Service: Endoscopy;  Laterality: N/A;   COLONOSCOPY WITH PROPOFOL  N/A 07/15/2020   Procedure: COLONOSCOPY WITH PROPOFOL ;  Surgeon: Luke Salaam, MD;  Location: [facility] ENDOSCOPY;  Service: Gastroenterology;  Laterality: N/A;   FLEXIBLE SIGMOIDOSCOPY N/A 12/06/2018   Procedure: FLEXIBLE SIGMOIDOSCOPY;  Surgeon: Luke Salaam, MD;  Location: [facility] ENDOSCOPY;  Service: Endoscopy;  Laterality: N/A;   LAPAROSCOPIC COLOSTOMY  01/06/2019   PORTACATH PLACEMENT N/A 04/03/2019   Procedure: INSERTION PORT-A-CATH;  Surgeon: Alben Alma, MD;  Location: [facility] ORS;  Service: General;  Laterality: N/A;    SOCIAL HISTORY: Social History   Socioeconomic History   Marital status: Widowed    Spouse name: Not on file   Number of children: Not on file   Years of education: Not on file   Highest education level: Some college, no degree  Occupational History   Not on file  Tobacco Use   Smoking status: Former     Current packs/day: 0.00    Types: Cigarettes    Quit date: 12/02/1977    Years since quitting: 46.2   Smokeless tobacco: Former  Building services engineer status: Never Used  Substance and Sexual Activity   Alcohol use: Never   Drug use: Never   Sexual activity: Not Currently    Birth control/protection: None  Other Topics Concern   Not on file  Social History Narrative   Lives with daughter   Social Drivers of Health   Financial Resource Strain: Low Risk  (11/15/2023)   Overall Financial Resource Strain (CARDIA)    Difficulty of Paying Living Expenses: Not hard at all  Food Insecurity: No Food Insecurity (11/15/2023)   Hunger Vital Sign    Worried About Running Out of Food in the Last Year: Never true    Ran Out of Food in the Last Year: Never true  Transportation Needs: No Transportation Needs (11/15/2023)   PRAPARE - Administrator, Civil Service (Medical): No    Lack of Transportation (Non-Medical): No  Physical Activity: Insufficiently Active (11/15/2023)   Exercise Vital Sign    Days of Exercise per Week: 3 days    Minutes of Exercise per Session: 10 min  Stress: No Stress Concern Present (11/15/2023)   [employer] of Occupational Health - Occupational Stress Questionnaire    Feeling of Stress : Not at all  Social  Connections: Moderately Integrated (11/15/2023)   Social Connection and Isolation Panel [NHANES]    Frequency of Communication with Friends and Family: More than three times a week    Frequency of Social Gatherings with Friends and Family: More than three times a week    Attends Religious Services: More than 4 times per year    Active Member of [employer] or Organizations: Yes    Attends Banker Meetings: More than 4 times per year    Marital Status: Widowed  Intimate Partner Violence: Not At Risk (09/27/2023)   Humiliation, Afraid, Rape, and Kick questionnaire    Fear of Current or Ex-Partner: No    Emotionally Abused: No    Physically  Abused: No    Sexually Abused: No    FAMILY HISTORY: Family History  Problem Relation Age of Onset   Colon cancer Brother 49       exposure to chemicals [place]   Hypertension Mother    Stroke Mother    Kidney failure Father    Breast cancer Neg Hx    Ovarian cancer Neg Hx     ALLERGIES:  is allergic to sulfamethoxazole-trimethoprim.  MEDICATIONS:  Current Outpatient Medications  Medication Sig Dispense Refill   acetaminophen  (TYLENOL ) 650 MG CR tablet Take 650 mg by mouth every 8 (eight) hours as needed for pain.     apixaban  (ELIQUIS ) 2.5 MG TABS tablet Take 1 tablet (2.5 mg total) by mouth 2 (two) times daily. 200 tablet 2   Cholecalciferol  (VITAMIN D3) 2000 units capsule Take 2,000 Units by mouth daily.     clindamycin  (CLINDAGEL) 1 % gel Apply topically 2 (two) times daily. 30 g 5   diclofenac  sodium (VOLTAREN ) 1 % GEL Apply 2 g topically 4 (four) times daily as needed (joint pain).  11   diphenoxylate -atropine  (LOMOTIL ) 2.5-0.025 MG tablet Take 1 tablet by mouth 4 (four) times daily as needed for diarrhea or loose stools. 120 tablet 0   fluticasone  (FLONASE ) 50 MCG/ACT nasal spray Place 1 spray into both nostrils daily as needed for allergies or rhinitis.     gentamicin  cream (GARAMYCIN ) 0.1 % Apply to affected toe once daily. 30 g 1   ketoconazole  (NIZORAL ) 2 % cream Apply 1 Application topically daily. For up to 2 weeks. May repeat if need. 30 g 2   lidocaine -prilocaine  (EMLA ) cream Apply 1 Application topically daily as needed. Apply small amount to port and cover with saran wrap 1-2 hours prior to port access 30 g 3   loratadine  (CLARITIN ) 10 MG tablet Take 10 mg by mouth daily.     magnesium  chloride (SLOW-MAG) 64 MG TBEC SR tablet Take 1 tablet (64 mg total) by mouth 2 (two) times daily. 60 tablet 2   Multiple Vitamins-Minerals (ONE-A-DAY WOMENS 50 PLUS PO) Take 1 tablet by mouth daily.      nystatin  (MYCOSTATIN /NYSTOP ) powder APPLY 1 POWDER TOPICALLY THREE TIMES DAILY  45 g 0   ondansetron  (ZOFRAN ) 8 MG tablet Take 1 tablet (8 mg total) by mouth every 8 (eight) hours as needed for nausea or vomiting. 30 tablet 1   potassium chloride  SA (KLOR-CON  M) 20 MEQ tablet TAKE 1 TABLET BY MOUTH DAILY 100 tablet 1   prochlorperazine  (COMPAZINE ) 10 MG tablet Take 1 tablet (10 mg total) by mouth every 6 (six) hours as needed (NAUSEA). 30 tablet 1   simvastatin  (ZOCOR ) 40 MG tablet Take 1 tablet (40 mg total) by mouth at bedtime. 100 tablet 3   tiZANidine  (ZANAFLEX )  4 MG tablet Take 0.5 tablets (2 mg total) by mouth every 8 (eight) hours as needed for muscle spasms. 10 tablet 0   triamcinolone  cream (KENALOG ) 0.5 % Apply 1 Application topically 2 (two) times daily. 90 g 1   triamterene -hydrochlorothiazide  (DYAZIDE ) 37.5-25 MG capsule Take 1 each (1 capsule total) by mouth daily. 90 capsule 1   trifluridine -tipiracil  (LONSURF ) 20-8.19 MG tablet Take 3 tablets (60 mg of trifluridine  total) by mouth 2 (two) times daily. 1hr after AM & PM meals days 1-5, 8-12. Repeat every 28d. 60 tablet 1   zinc gluconate 50 MG tablet Take 50 mg by mouth daily.     No current facility-administered medications for this visit.   Facility-Administered Medications Ordered in Other Visits  Medication Dose Route Frequency Provider Last Rate Last Admin   0.9 %  sodium chloride  infusion   Intravenous Continuous Timmy Forbes, MD       bevacizumab -awwb (MVASI ) 350 mg in sodium chloride  0.9 % 100 mL chemo infusion  5 mg/kg (Order-Specific) Intravenous Once Timmy Forbes, MD       heparin  lock flush 100 UNIT/ML injection            heparin  lock flush 100 unit/mL  500 Units Intracatheter Once PRN Timmy Forbes, MD       sodium chloride  flush (NS) 0.9 % injection 10 mL  10 mL Intracatheter PRN Timmy Forbes, MD         PHYSICAL EXAMINATION: ECOG PERFORMANCE STATUS: 1 - Symptomatic but completely ambulatory  Physical Exam Constitutional:      General: She is not in acute distress. HENT:     Head: Normocephalic and  atraumatic.  Eyes:     General: No scleral icterus. Cardiovascular:     Rate and Rhythm: Normal rate.     Heart sounds: Normal heart sounds.  Pulmonary:     Effort: Pulmonary effort is normal. No respiratory distress.     Breath sounds: No wheezing.  Abdominal:     General: Bowel sounds are normal. There is no distension.     Palpations: Abdomen is soft.     Comments: + Colostomy bag   Musculoskeletal:        General: No deformity. Normal range of motion.     Cervical back: Normal range of motion and neck supple.  Skin:    General: Skin is warm and dry.  Neurological:     Mental Status: She is alert and oriented to person, place, and time. Mental status is at baseline.       LABORATORY DATA:  I have reviewed the data as listed    Latest Ref Rng & Units 02/21/2024    7:48 AM 02/14/2024    7:57 AM 01/31/2024    8:38 AM  CBC  WBC 4.0 - 10.5 K/uL 3.6  2.1  3.0   Hemoglobin 12.0 - 15.0 g/dL 28.4  13.2  44.0   Hematocrit 36.0 - 46.0 % 37.0  33.0  32.8   Platelets 150 - 400 K/uL 176  192  190       Latest Ref Rng & Units 02/21/2024    7:48 AM 02/14/2024    7:57 AM 01/31/2024    8:38 AM  CMP  Glucose 70 - 99 mg/dL 102  725  366   BUN 8 - 23 mg/dL 15  23  16    Creatinine 0.44 - 1.00 mg/dL 4.40  3.47  4.25   Sodium 135 - 145 mmol/L 139  138  136   Potassium 3.5 - 5.1 mmol/L 3.9  3.6  3.9   Chloride 98 - 111 mmol/L 104  103  101   CO2 22 - 32 mmol/L 26  27  26    Calcium  8.9 - 10.3 mg/dL 9.4  9.0  9.3   Total Protein 6.5 - 8.1 g/dL 7.2  7.1  7.1   Total Bilirubin 0.0 - 1.2 mg/dL 0.5  0.4  0.5   Alkaline Phos 38 - 126 U/L 75  73  76   AST 15 - 41 U/L 39  31  31   ALT 0 - 44 U/L 21  18  18       RADIOGRAPHIC STUDIES: I have personally reviewed the radiological images as listed and agreed with the findings in the report. MM 3D SCREENING MAMMOGRAM BILATERAL BREAST Result Date: 01/25/2024 CLINICAL DATA:  Screening. EXAM: DIGITAL SCREENING BILATERAL MAMMOGRAM WITH TOMOSYNTHESIS AND  CAD TECHNIQUE: Bilateral screening digital craniocaudal and mediolateral oblique mammograms were obtained. Bilateral screening digital breast tomosynthesis was performed. The images were evaluated with computer-aided detection. COMPARISON:  Previous exam(s). ACR Breast Density Category b: There are scattered areas of fibroglandular density. FINDINGS: There are no findings suspicious for malignancy. IMPRESSION: No mammographic evidence of malignancy. A result letter of this screening mammogram will be mailed directly to the patient. RECOMMENDATION: Screening mammogram in one year. (Code:[id number]) BI-RADS CATEGORY  1: Negative. Electronically Signed   By: Dina  Arceo M.D.   On: 01/25/2024 07:45   NM PET Image Restag (PS) Skull Base To Thigh Result Date: 01/17/2024 CLINICAL DATA:  Subsequent treatment strategy for rectal cancer. EXAM: NUCLEAR MEDICINE PET SKULL BASE TO THIGH TECHNIQUE: 8.5 mCi F-18 FDG was injected intravenously. Full-ring PET imaging was performed from the skull base to thigh after the radiotracer. CT data was obtained and used for attenuation correction and anatomic localization. Fasting blood glucose: 81 mg/dl COMPARISON:  54/06/8118, and CT scan of 11/28/2023 FINDINGS: Mediastinal blood pool activity: SUV max 2.1 Liver activity: SUV max NA NECK: No significant abnormal hypermetabolic activity in this region. Incidental CT findings: None. CHEST: Compared to prior PET-CT there is progressive left supraclavicular adenopathy measuring up to 1.2 cm in short axis with maximum SUV of 13.4 (formerly 4.9). Numerous scattered pulmonary nodules throughout both lungs, index lesion in the right middle lobe measuring 1.6 by 1.4 cm on image 68 series 6 with maximum SUV 11.5 (previously 4.3). Many of the nodules are new compared to the prior PET-CT, these are roughly similar in distribution to the CT chest from 11/28/2023. Incidental small lesion in the adipose tissues just above the left scapula, likely a lymph  node, measuring 4 mm in short axis on image 35 series 6, maximum SUV 3.3. Incidental CT findings: Right Port-A-Cath tip: Right atrium. ABDOMEN/PELVIS: The subcapsular lesion along the anterior inferior right hepatic lobe described on CT of 11/28/2023 is not discernibly hypermetabolic or visible on today's PET-CT. Abnormal hypermetabolic retrocrural and retroperitoneal adenopathy along with hypermetabolic right common iliac, bilateral external iliac, and bilateral inguinal adenopathy. Index left periaortic lymph node on image 95 series 6 measures 1.4 cm in short axis with maximum SUV 12.8. Similar size on CT from 11/28/2023, back on 07/09/2023 this node measured 0.7 cm with maximum SUV 4.2. Hypermetabolic irregular tumor extends along the right inguinal ring region and right hip adductor musculature as well as in the subcutaneous and cutaneous tissues along the right inguinal fold, compatible with tumor involvement. The multifocal cutaneous involvement has a maximum SUV of  10.1. Right eccentric vulvar activity just below the clitoris has a maximum SUV of 7.5, and given the regional soft tissue metastatic disease, a metastatic focus in this vicinity is not excluded. Incidental CT findings: Benign renal cysts. No further imaging workup of these lesions is indicated. Atherosclerosis is present, including aortoiliac atherosclerotic disease. Descending left colostomy noted with mild diverticulosis. SKELETON: Substantial multifocal skeletal involvement including the right proximal humerus, right medial clavicle, left T11 vertebral body, L4 posterior elements, and entire left iliac crest. The permeative lesion of the right humerus may have some extraosseous extension, maximum SUV 16.0. Similarly there is cortical breakthrough in the large left iliac crest lesion which has a maximum SUV of 14.2. Incidental CT findings: None. IMPRESSION: 1. Progressive disease with progressive left supraclavicular adenopathy, progressive  pulmonary metastatic disease, progressive abdominal and pelvic adenopathy, and progressive multifocal skeletal involvement. 2. Substantial multifocal skeletal involvement including the right proximal humerus, right medial clavicle, left T11 vertebral body, L4 posterior elements, and entire left iliac crest. The permeative lesion of the right humerus may have some extraosseous extension. Similarly there is cortical breakthrough in the large left iliac crest lesion. 3. Hypermetabolic irregular tumor extends along the right inguinal ring region and right hip adductor musculature as well as in the subcutaneous and cutaneous tissues along the right inguinal fold, compatible with tumor involvement. 4. Right eccentric vulvar activity just below the clitoris has a maximum SUV of 7.5, and given the regional soft tissue metastatic disease, a metastatic focus in this vicinity is not excluded. 5. The subcapsular lesion along the anterior inferior right hepatic lobe described on CT of 11/28/2023 is not discernibly hypermetabolic or visible on today's PET-CT. 6. Descending left colostomy with mild diverticulosis. 7.  Aortic Atherosclerosis (ICD10-I70.0). Electronically Signed   By: Freida Jes M.D.   On: 01/17/2024 09:48   DG Shoulder Right Result Date: 12/27/2023 CLINICAL DATA:  Chronic right shoulder pain without known injury. EXAM: RIGHT SHOULDER - 2+ VIEW COMPARISON:  None Available. FINDINGS: Possible nondisplaced fracture involving greater tuberosity. No dislocation is noted. Moderate degenerative changes are seen involving the right glenohumeral and acromioclavicular joints. Possible Hill-Sachs deformity suggesting prior dislocations. IMPRESSION: Possible nondisplaced fracture involving greater tuberosity of proximal humerus. Degenerative changes as noted above. Electronically Signed   By: Rosalene Colon M.D.   On: 12/27/2023 09:51   VAS US  LOWER EXTREMITY VENOUS REFLUX Result Date: 12/21/2023  Lower Venous  Reflux Study Patient Name:  GENESES JAIN  Date of Exam:   12/20/2023 Medical Rec #: [id number]       Accession #:    [id number] Date of Birth: 1945/10/25       Patient Gender: F Patient Age:   21 years Exam Location:  [facility] Procedure:      VAS US  LOWER EXTREMITY VENOUS REFLUX Referring Phys: Sharla Davis --------------------------------------------------------------------------------  Indications: Pain, Swelling, Edema, and rt knee.  Performing Technologist: Faustine Hoof RVT  Examination Guidelines: A complete evaluation includes B-mode imaging, spectral Doppler, color Doppler, and power Doppler as needed of all accessible portions of each vessel. Bilateral testing is considered an integral part of a complete examination. Limited examinations for reoccurring indications may be performed as noted. The reflux portion of the exam is performed with the patient in reverse Trendelenburg. Significant venous reflux is defined as >500 ms in the superficial venous system, and >1 second in the deep venous system.   Summary: Right: - No evidence of deep vein thrombosis seen in the right  lower extremity, from the common femoral through the popliteal veins. - No evidence of superficial venous thrombosis in the right lower extremity. - There is no evidence of venous reflux seen in the right lower extremity. - No evidence of superficial venous reflux seen in the right greater saphenous vein. - No evidence of superficial venous reflux seen in the right short saphenous vein.  *See table(s) above for measurements and observations. Electronically signed by Devon Fogo MD on 12/21/2023 at 8:33:40 AM.    Final    CT CHEST ABDOMEN PELVIS W CONTRAST Result Date: 11/28/2023 CLINICAL DATA:  Rectal cancer, follow-up.  * Tracking Code: BO * EXAM: CT CHEST, ABDOMEN, AND PELVIS WITH CONTRAST TECHNIQUE: Multidetector CT imaging of the chest, abdomen and pelvis was performed following the standard protocol during bolus  administration of intravenous contrast. RADIATION DOSE REDUCTION: This exam was performed according to the departmental dose-optimization program which includes automated exposure control, adjustment of the mA and/or kV according to patient size and/or use of iterative reconstruction technique. CONTRAST:  85mL OMNIPAQUE  IOHEXOL  300 MG/ML  SOLN COMPARISON:  Multiple priors including CT September 24, 2023 FINDINGS: CT CHEST FINDINGS Cardiovascular: Right chest Port-A-Cath with tip in the right atrium. Thoracic aortic atherosclerosis. Dilated main pulmonary artery. Coronary artery calcifications. Normal size heart. Mediastinum/Nodes: Prominent supraclavicular lymph nodes measure 9 mm on image 6/2 previously 7 mm. No pathologically enlarged mediastinal, hilar or axillary lymph nodes. No suspicious thyroid  nodule.  Tiny hiatal hernia. Lungs/Pleura: Numerous bilateral solid pulmonary nodules are again seen. Some of which have increased in size and others are stable in size. For reference:: -medial right middle lobe pulmonary nodule measures 18 x 16 mm on image 95/3 previously 16 x 14 mm. -anterior left lower lobe pulmonary nodule measures 7 mm on image 111/3, unchanged. -left upper lobe pulmonary nodule measures 9 mm on image 65/3 previously 7 mm. Musculoskeletal: No aggressive lytic or blastic lesion of bone. Multilevel degenerative change of the spine. CT ABDOMEN PELVIS FINDINGS Hepatobiliary: Increased size of the subcapsular lesion in the anterior inferior right lobe of the liver now measuring 10 mm on image 69/2 previously 8 mm. Gallbladder is surgically absent. No biliary ductal dilation. Pancreas: No pancreatic ductal dilation or evidence of acute inflammation. Spleen: No splenomegaly.  Stable splenic lymphangioma/cysts. Adrenals/Urinary Tract: No suspicious adrenal nodule. Stable bilateral renal cysts. No hydronephrosis. Urinary bladder is unremarkable for degree of distension. Stomach/Bowel: Radiopaque enteric  contrast material traverses the transverse colon. Stomach is unremarkable for degree of distension. No pathologic dilation of small or large bowel. Questionable wall thickening of matted loops of small bowel in the left lower quadrant on image 89/2. Prior partial colectomy and rectal resection with left anterior abdominal wall colostomy. Fluid and soft tissue stranding in the mesorectal/presacral space without discrete enhancing soft tissue nodularity. Vascular/Lymphatic: Aortic atherosclerosis. Increased size of retroperitoneal, iliac side chain and inguinal lymph nodes. For reference: Left periaortic lymph node measures 12 mm in short axis on image 66/2 previously 9 mm. -right external iliac lymph node measuring 18 mm in short axis on image 98/2 previously measured 14 mm -Right pelvic sidewall lymph node measures 10 mm in short axis on image 93/2 previously 7 mm. Reproductive: Uterus is surgically absent. Other: Small volume abdominopelvic free fluid. Postsurgical change in the abdominal wall. Musculoskeletal: Similar appearance of the permeative lytic metastatic lesion in the left iliac wing. No new aggressive lytic or blastic lesion of bone. Multilevel degenerative change of the spine. Demineralization of bone. IMPRESSION: 1. Increased  size of retroperitoneal, iliac side chain and inguinal lymph nodes, concerning for progressive nodal metastatic disease. 2. Numerous bilateral solid pulmonary nodules are again seen, some of which have increased in size and others are stable in size overall compatible with disease progression. 3. Increased size of the subcapsular lesion in the anterior inferior right lobe of the liver, which has been stable over multiple prior examinations and was not FDG avid on prior PET-CT. Suggest attention on follow-up imaging. 4. 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 5. Questionable wall thickening of matted loops of small bowel in the left  lower quadrant, suggest attention on follow-up imaging. 6. Small volume abdominopelvic free fluid. 7.  Aortic Atherosclerosis (ICD10-I70.0). Electronically Signed   By: Tama Fails M.D.   On: 11/28/2023 15:20      MM 3D SCREENING MAMMOGRAM BILATERAL BREAST Result Date: 01/25/2024 CLINICAL DATA:  Screening. EXAM: DIGITAL SCREENING BILATERAL MAMMOGRAM WITH TOMOSYNTHESIS AND CAD TECHNIQUE: Bilateral screening digital craniocaudal and mediolateral oblique mammograms were obtained. Bilateral screening digital breast tomosynthesis was performed. The images were evaluated with computer-aided detection. COMPARISON:  Previous exam(s). ACR Breast Density Category b: There are scattered areas of fibroglandular density. FINDINGS: There are no findings suspicious for malignancy. IMPRESSION: No mammographic evidence of malignancy. A result letter of this screening mammogram will be mailed directly to the patient. RECOMMENDATION: Screening mammogram in one year. (Code:[id number]) BI-RADS CATEGORY  1: Negative. Electronically Signed   By: Dina  Arceo M.D.   On: 01/25/2024 07:45   NM PET Image Restag (PS) Skull Base To Thigh Result Date: 01/17/2024 CLINICAL DATA:  Subsequent treatment strategy for rectal cancer. EXAM: NUCLEAR MEDICINE PET SKULL BASE TO THIGH TECHNIQUE: 8.5 mCi F-18 FDG was injected intravenously. Full-ring PET imaging was performed from the skull base to thigh after the radiotracer. CT data was obtained and used for attenuation correction and anatomic localization. Fasting blood glucose: 81 mg/dl COMPARISON:  40/98/1191, and CT scan of 11/28/2023 FINDINGS: Mediastinal blood pool activity: SUV max 2.1 Liver activity: SUV max NA NECK: No significant abnormal hypermetabolic activity in this region. Incidental CT findings: None. CHEST: Compared to prior PET-CT there is progressive left supraclavicular adenopathy measuring up to 1.2 cm in short axis with maximum SUV of 13.4 (formerly 4.9). Numerous scattered  pulmonary nodules throughout both lungs, index lesion in the right middle lobe measuring 1.6 by 1.4 cm on image 68 series 6 with maximum SUV 11.5 (previously 4.3). Many of the nodules are new compared to the prior PET-CT, these are roughly similar in distribution to the CT chest from 11/28/2023. Incidental small lesion in the adipose tissues just above the left scapula, likely a lymph node, measuring 4 mm in short axis on image 35 series 6, maximum SUV 3.3. Incidental CT findings: Right Port-A-Cath tip: Right atrium. ABDOMEN/PELVIS: The subcapsular lesion along the anterior inferior right hepatic lobe described on CT of 11/28/2023 is not discernibly hypermetabolic or visible on today's PET-CT. Abnormal hypermetabolic retrocrural and retroperitoneal adenopathy along with hypermetabolic right common iliac, bilateral external iliac, and bilateral inguinal adenopathy. Index left periaortic lymph node on image 95 series 6 measures 1.4 cm in short axis with maximum SUV 12.8. Similar size on CT from 11/28/2023, back on 07/09/2023 this node measured 0.7 cm with maximum SUV 4.2. Hypermetabolic irregular tumor extends along the right inguinal ring region and right hip adductor musculature as well as in the subcutaneous and cutaneous tissues along the right inguinal fold, compatible with tumor involvement. The  multifocal cutaneous involvement has a maximum SUV of 10.1. Right eccentric vulvar activity just below the clitoris has a maximum SUV of 7.5, and given the regional soft tissue metastatic disease, a metastatic focus in this vicinity is not excluded. Incidental CT findings: Benign renal cysts. No further imaging workup of these lesions is indicated. Atherosclerosis is present, including aortoiliac atherosclerotic disease. Descending left colostomy noted with mild diverticulosis. SKELETON: Substantial multifocal skeletal involvement including the right proximal humerus, right medial clavicle, left T11 vertebral body, L4  posterior elements, and entire left iliac crest. The permeative lesion of the right humerus may have some extraosseous extension, maximum SUV 16.0. Similarly there is cortical breakthrough in the large left iliac crest lesion which has a maximum SUV of 14.2. Incidental CT findings: None. IMPRESSION: 1. Progressive disease with progressive left supraclavicular adenopathy, progressive pulmonary metastatic disease, progressive abdominal and pelvic adenopathy, and progressive multifocal skeletal involvement. 2. Substantial multifocal skeletal involvement including the right proximal humerus, right medial clavicle, left T11 vertebral body, L4 posterior elements, and entire left iliac crest. The permeative lesion of the right humerus may have some extraosseous extension. Similarly there is cortical breakthrough in the large left iliac crest lesion. 3. Hypermetabolic irregular tumor extends along the right inguinal ring region and right hip adductor musculature as well as in the subcutaneous and cutaneous tissues along the right inguinal fold, compatible with tumor involvement. 4. Right eccentric vulvar activity just below the clitoris has a maximum SUV of 7.5, and given the regional soft tissue metastatic disease, a metastatic focus in this vicinity is not excluded. 5. The subcapsular lesion along the anterior inferior right hepatic lobe described on CT of 11/28/2023 is not discernibly hypermetabolic or visible on today's PET-CT. 6. Descending left colostomy with mild diverticulosis. 7.  Aortic Atherosclerosis (ICD10-I70.0). Electronically Signed   By: Freida Jes M.D.   On: 01/17/2024 09:48   DG Shoulder Right Result Date: 12/27/2023 CLINICAL DATA:  Chronic right shoulder pain without known injury. EXAM: RIGHT SHOULDER - 2+ VIEW COMPARISON:  None Available. FINDINGS: Possible nondisplaced fracture involving greater tuberosity. No dislocation is noted. Moderate degenerative changes are seen involving the right  glenohumeral and acromioclavicular joints. Possible Hill-Sachs deformity suggesting prior dislocations. IMPRESSION: Possible nondisplaced fracture involving greater tuberosity of proximal humerus. Degenerative changes as noted above. Electronically Signed   By: Rosalene Colon M.D.   On: 12/27/2023 09:51   VAS US  LOWER EXTREMITY VENOUS REFLUX Result Date: 12/21/2023  Lower Venous Reflux Study Patient Name:  DELPHA GIBNEY  Date of Exam:   12/20/2023 Medical Rec #: [id number]       Accession #:    [id number] Date of Birth: 1946-02-04       Patient Gender: F Patient Age:   67 years Exam Location:  [facility] Procedure:      VAS US  LOWER EXTREMITY VENOUS REFLUX Referring Phys: Sharla Davis --------------------------------------------------------------------------------  Indications: Pain, Swelling, Edema, and rt knee.  Performing Technologist: Faustine Hoof RVT  Examination Guidelines: A complete evaluation includes B-mode imaging, spectral Doppler, color Doppler, and power Doppler as needed of all accessible portions of each vessel. Bilateral testing is considered an integral part of a complete examination. Limited examinations for reoccurring indications may be performed as noted. The reflux portion of the exam is performed with the patient in reverse Trendelenburg. Significant venous reflux is defined as >500 ms in the superficial venous system, and >1 second in the deep venous system.   Summary: Right: - No evidence  of deep vein thrombosis seen in the right lower extremity, from the common femoral through the popliteal veins. - No evidence of superficial venous thrombosis in the right lower extremity. - There is no evidence of venous reflux seen in the right lower extremity. - No evidence of superficial venous reflux seen in the right greater saphenous vein. - No evidence of superficial venous reflux seen in the right short saphenous vein.  *See table(s) above for measurements and observations.  Electronically signed by Devon Fogo MD on 12/21/2023 at 8:33:40 AM.    Final    CT CHEST ABDOMEN PELVIS W CONTRAST Result Date: 11/28/2023 CLINICAL DATA:  Rectal cancer, follow-up.  * Tracking Code: BO * EXAM: CT CHEST, ABDOMEN, AND PELVIS WITH CONTRAST TECHNIQUE: Multidetector CT imaging of the chest, abdomen and pelvis was performed following the standard protocol during bolus administration of intravenous contrast. RADIATION DOSE REDUCTION: This exam was performed according to the departmental dose-optimization program which includes automated exposure control, adjustment of the mA and/or kV according to patient size and/or use of iterative reconstruction technique. CONTRAST:  85mL OMNIPAQUE  IOHEXOL  300 MG/ML  SOLN COMPARISON:  Multiple priors including CT September 24, 2023 FINDINGS: CT CHEST FINDINGS Cardiovascular: Right chest Port-A-Cath with tip in the right atrium. Thoracic aortic atherosclerosis. Dilated main pulmonary artery. Coronary artery calcifications. Normal size heart. Mediastinum/Nodes: Prominent supraclavicular lymph nodes measure 9 mm on image 6/2 previously 7 mm. No pathologically enlarged mediastinal, hilar or axillary lymph nodes. No suspicious thyroid  nodule.  Tiny hiatal hernia. Lungs/Pleura: Numerous bilateral solid pulmonary nodules are again seen. Some of which have increased in size and others are stable in size. For reference:: -medial right middle lobe pulmonary nodule measures 18 x 16 mm on image 95/3 previously 16 x 14 mm. -anterior left lower lobe pulmonary nodule measures 7 mm on image 111/3, unchanged. -left upper lobe pulmonary nodule measures 9 mm on image 65/3 previously 7 mm. Musculoskeletal: No aggressive lytic or blastic lesion of bone. Multilevel degenerative change of the spine. CT ABDOMEN PELVIS FINDINGS Hepatobiliary: Increased size of the subcapsular lesion in the anterior inferior right lobe of the liver now measuring 10 mm on image 69/2 previously 8 mm.  Gallbladder is surgically absent. No biliary ductal dilation. Pancreas: No pancreatic ductal dilation or evidence of acute inflammation. Spleen: No splenomegaly.  Stable splenic lymphangioma/cysts. Adrenals/Urinary Tract: No suspicious adrenal nodule. Stable bilateral renal cysts. No hydronephrosis. Urinary bladder is unremarkable for degree of distension. Stomach/Bowel: Radiopaque enteric contrast material traverses the transverse colon. Stomach is unremarkable for degree of distension. No pathologic dilation of small or large bowel. Questionable wall thickening of matted loops of small bowel in the left lower quadrant on image 89/2. Prior partial colectomy and rectal resection with left anterior abdominal wall colostomy. Fluid and soft tissue stranding in the mesorectal/presacral space without discrete enhancing soft tissue nodularity. Vascular/Lymphatic: Aortic atherosclerosis. Increased size of retroperitoneal, iliac side chain and inguinal lymph nodes. For reference: Left periaortic lymph node measures 12 mm in short axis on image 66/2 previously 9 mm. -right external iliac lymph node measuring 18 mm in short axis on image 98/2 previously measured 14 mm -Right pelvic sidewall lymph node measures 10 mm in short axis on image 93/2 previously 7 mm. Reproductive: Uterus is surgically absent. Other: Small volume abdominopelvic free fluid. Postsurgical change in the abdominal wall. Musculoskeletal: Similar appearance of the permeative lytic metastatic lesion in the left iliac wing. No new aggressive lytic or blastic lesion of bone. Multilevel degenerative change of  the spine. Demineralization of bone. IMPRESSION: 1. Increased size of retroperitoneal, iliac side chain and inguinal lymph nodes, concerning for progressive nodal metastatic disease. 2. Numerous bilateral solid pulmonary nodules are again seen, some of which have increased in size and others are stable in size overall compatible with disease progression.  3. Increased size of the subcapsular lesion in the anterior inferior right lobe of the liver, which has been stable over multiple prior examinations and was not FDG avid on prior PET-CT. Suggest attention on follow-up imaging. 4. 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 5. Questionable wall thickening of matted loops of small bowel in the left lower quadrant, suggest attention on follow-up imaging. 6. Small volume abdominopelvic free fluid. 7.  Aortic Atherosclerosis (ICD10-I70.0). Electronically Signed   By: Tama Fails M.D.   On: 11/28/2023 15:20

## 2024-02-21 NOTE — Assessment & Plan Note (Signed)
 S/p skin nodule biopsy confirmed metastatic adenocarcinoma consistent with colorectal origin.  Follow up with wound care

## 2024-02-21 NOTE — Assessment & Plan Note (Signed)
 Stable symptoms, Grade 1-2.patient copes well.

## 2024-02-21 NOTE — Progress Notes (Deleted)
 Dose changed to 350mg  per Dr Wilhelmenia Harada

## 2024-02-21 NOTE — Assessment & Plan Note (Signed)
 History of PE and bilateral lower extremity DVT  Continue  Eliquis  2.5 mg twice daily for anticoagulation prophylaxis. Recommend leg elevation and compression stocking.

## 2024-02-21 NOTE — Assessment & Plan Note (Signed)
 Chemotherapy plan as listed above

## 2024-02-21 NOTE — Progress Notes (Signed)
 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  Receiving lonsurf  and bevacizumab .    Met with patient and daughter during infusion.  Reports that her appetite goes up and down due to taste change.  Tried the ensure complete with sherbet and lactaid free ice cream.  She loved it.  Has been able to eat chicken, fish, barbecue sandwich, biscuits, salmon patties, vegetables, peanut butter, chicken salad, meatloaf, cereal, yogurt    Medications: glucose 113  Labs: reviewed  Anthropometrics:   Weight 155 lb 8 oz today (edema is better)  157 lb on 4/17 169 lb on 1/28 (edema) 163 lb on 12/31   NUTRITION DIAGNOSIS: Altered GI function improving   INTERVENTION:  Encouraged to continue 350 calorie shake 1-2 times a day.  Continue mixing with sherbet and ice cream Continue eating high calorie, high protein foods to maintain weight    MONITORING, EVALUATION, GOAL: weight trends, intake   NEXT VISIT: Thursday, June 19 during infusion  Morgan Keinath B. Zollie Hipp, CSO, LDN Registered Dietitian [phone]

## 2024-02-23 DIAGNOSIS — Z933 Colostomy status: Secondary | ICD-10-CM | POA: Diagnosis not present

## 2024-02-28 ENCOUNTER — Encounter: Attending: Physician Assistant | Admitting: Physician Assistant

## 2024-02-28 ENCOUNTER — Inpatient Hospital Stay: Admitting: Oncology

## 2024-02-28 ENCOUNTER — Inpatient Hospital Stay

## 2024-02-28 DIAGNOSIS — C4499 Other specified malignant neoplasm of skin, unspecified: Secondary | ICD-10-CM | POA: Diagnosis not present

## 2024-02-28 DIAGNOSIS — Z85048 Personal history of other malignant neoplasm of rectum, rectosigmoid junction, and anus: Secondary | ICD-10-CM | POA: Insufficient documentation

## 2024-02-28 DIAGNOSIS — Z86718 Personal history of other venous thrombosis and embolism: Secondary | ICD-10-CM | POA: Insufficient documentation

## 2024-02-28 DIAGNOSIS — L98492 Non-pressure chronic ulcer of skin of other sites with fat layer exposed: Secondary | ICD-10-CM | POA: Insufficient documentation

## 2024-02-28 DIAGNOSIS — C3412 Malignant neoplasm of upper lobe, left bronchus or lung: Secondary | ICD-10-CM | POA: Diagnosis not present

## 2024-02-28 DIAGNOSIS — Z7901 Long term (current) use of anticoagulants: Secondary | ICD-10-CM | POA: Diagnosis not present

## 2024-02-29 DIAGNOSIS — L98492 Non-pressure chronic ulcer of skin of other sites with fat layer exposed: Secondary | ICD-10-CM | POA: Diagnosis not present

## 2024-02-29 DIAGNOSIS — Z7901 Long term (current) use of anticoagulants: Secondary | ICD-10-CM | POA: Diagnosis not present

## 2024-03-03 ENCOUNTER — Other Ambulatory Visit: Payer: Self-pay | Admitting: Pharmacy Technician

## 2024-03-03 ENCOUNTER — Other Ambulatory Visit: Payer: Self-pay

## 2024-03-03 NOTE — Progress Notes (Signed)
 Specialty Pharmacy Refill Coordination Note  Jacqueline Yoder is a 78 y.o. female contacted today regarding refills of specialty medication(s) Trifluridine -Tipiracil  (LONSURF )   Patient requested (Patient-Rptd) Delivery   Delivery date: (Patient-Rptd) 03/13/24   Verified address: (Patient-Rptd) [address] [place], [place] [address]   Medication will be filled on 03/12/24.

## 2024-03-04 ENCOUNTER — Encounter (INDEPENDENT_AMBULATORY_CARE_PROVIDER_SITE_OTHER): Payer: Self-pay

## 2024-03-05 ENCOUNTER — Encounter: Payer: Self-pay | Admitting: Radiation Oncology

## 2024-03-05 ENCOUNTER — Ambulatory Visit
Admission: RE | Admit: 2024-03-05 | Discharge: 2024-03-05 | Disposition: A | Discharge status: Home / Self Care | Source: Ambulatory Visit | Attending: Radiation Oncology | Admitting: Radiation Oncology

## 2024-03-05 VITALS — BP 109/69 | HR 102 | Temp 98.0°F | Resp 12 | Wt 146.9 lb

## 2024-03-05 DIAGNOSIS — C20 Malignant neoplasm of rectum: Secondary | ICD-10-CM | POA: Diagnosis not present

## 2024-03-05 DIAGNOSIS — C7951 Secondary malignant neoplasm of bone: Secondary | ICD-10-CM | POA: Diagnosis not present

## 2024-03-05 NOTE — Progress Notes (Signed)
 Radiation Oncology Follow up Note  Name: Jacqueline Yoder   Date:   03/05/2024 MRN:  [id number] DOB: 26-Sep-1946    This 78 y.o. female presents to the clinic today for 1 month follow-up status post palliative ration therapy to her right shoulder for patient with known stage IV adenocarcinoma the rectum.  REFERRING PROVIDER: Domingo Friend *  HPI: Patient is a 78 year old female well-known to department previously treated back in 21 for stage IIIc adenocarcinoma the rectum.  She recently completed palliative radiation therapy to her right shoulder for metastatic disease she is seen today in routine follow-up.  She states her right shoulder is somewhat sore at times with the pain is markedly improved.  She also has good range of motion of that shoulder..  She did have metastatic disease in her right inguinal region involving the skin.  She is currently on lLongsuf  and bevacizumab  which she apparently is tolerating well.  COMPLICATIONS OF TREATMENT: none  FOLLOW UP COMPLIANCE: keeps appointments   PHYSICAL EXAM:  BP 109/69   Pulse (!) 102   Temp 98 F (36.7 C) (Tympanic)   Resp 12   Wt 146 lb 14.4 oz (66.6 kg)   BMI 25.22 kg/m  Range of motion of t her upper extremities does not elicit pain.  There is good strength in the right upper extremity.  The palpation of her spine does not elicit pain well-developed well-nourished patient in NAD. HEENT reveals PERLA, EOMI, discs not visualized.  Oral cavity is clear. No oral mucosal lesions are identified. Neck is clear without evidence of cervical or supraclavicular adenopathy. Lungs are clear to A&P. Cardiac examination is essentially unremarkable with regular rate and rhythm without murmur rub or thrill. Abdomen is benign with no organomegaly or masses noted. Motor sensory and DTR levels are equal and symmetric in the upper and lower extremities. Cranial nerves II through XII are grossly intact. Proprioception is intact. No peripheral  adenopathy or edema is identified. No motor or sensory levels are noted. Crude visual fields are within normal range.  RADIOLOGY RESULTS: No current films to review  PLAN: At the present time patient is doing well.  No other areas that need palliative treatment at this point in time.  She continues care through the wound clinic for her metastatic skin involvement of the right inguinal region.  She continues immunotherapy under Dr. Jackqueline Mason direction.  I will turn follow-up care over to medical oncology.  I will be happy to reevaluate patient anytime the future should that be indicated.  I would like to take this opportunity to thank you for allowing me to participate in the care of your patient.Glenis Langdon, MD

## 2024-03-06 ENCOUNTER — Inpatient Hospital Stay (HOSPITAL_BASED_OUTPATIENT_CLINIC_OR_DEPARTMENT_OTHER): Admitting: Oncology

## 2024-03-06 ENCOUNTER — Inpatient Hospital Stay

## 2024-03-06 ENCOUNTER — Encounter: Payer: Self-pay | Admitting: Oncology

## 2024-03-06 VITALS — BP 122/67 | HR 92 | Temp 97.1°F | Resp 18 | Ht 64.0 in | Wt 156.5 lb

## 2024-03-06 DIAGNOSIS — Z86711 Personal history of pulmonary embolism: Secondary | ICD-10-CM

## 2024-03-06 DIAGNOSIS — C20 Malignant neoplasm of rectum: Secondary | ICD-10-CM

## 2024-03-06 DIAGNOSIS — K521 Toxic gastroenteritis and colitis: Secondary | ICD-10-CM

## 2024-03-06 DIAGNOSIS — T451X5A Adverse effect of antineoplastic and immunosuppressive drugs, initial encounter: Secondary | ICD-10-CM

## 2024-03-06 DIAGNOSIS — C792 Secondary malignant neoplasm of skin: Secondary | ICD-10-CM

## 2024-03-06 DIAGNOSIS — Z5111 Encounter for antineoplastic chemotherapy: Secondary | ICD-10-CM

## 2024-03-06 DIAGNOSIS — L98492 Non-pressure chronic ulcer of skin of other sites with fat layer exposed: Secondary | ICD-10-CM | POA: Diagnosis not present

## 2024-03-06 DIAGNOSIS — Z800 Family history of malignant neoplasm of digestive organs: Secondary | ICD-10-CM | POA: Diagnosis not present

## 2024-03-06 DIAGNOSIS — I10 Essential (primary) hypertension: Secondary | ICD-10-CM | POA: Diagnosis not present

## 2024-03-06 DIAGNOSIS — R590 Localized enlarged lymph nodes: Secondary | ICD-10-CM | POA: Diagnosis not present

## 2024-03-06 DIAGNOSIS — C7801 Secondary malignant neoplasm of right lung: Secondary | ICD-10-CM | POA: Diagnosis not present

## 2024-03-06 DIAGNOSIS — G620 Drug-induced polyneuropathy: Secondary | ICD-10-CM

## 2024-03-06 DIAGNOSIS — D702 Other drug-induced agranulocytosis: Secondary | ICD-10-CM | POA: Diagnosis not present

## 2024-03-06 DIAGNOSIS — Z86718 Personal history of other venous thrombosis and embolism: Secondary | ICD-10-CM | POA: Diagnosis not present

## 2024-03-06 DIAGNOSIS — K219 Gastro-esophageal reflux disease without esophagitis: Secondary | ICD-10-CM | POA: Diagnosis not present

## 2024-03-06 DIAGNOSIS — J90 Pleural effusion, not elsewhere classified: Secondary | ICD-10-CM | POA: Diagnosis not present

## 2024-03-06 DIAGNOSIS — E7800 Pure hypercholesterolemia, unspecified: Secondary | ICD-10-CM | POA: Diagnosis not present

## 2024-03-06 DIAGNOSIS — C799 Secondary malignant neoplasm of unspecified site: Secondary | ICD-10-CM

## 2024-03-06 DIAGNOSIS — D6481 Anemia due to antineoplastic chemotherapy: Secondary | ICD-10-CM | POA: Diagnosis not present

## 2024-03-06 DIAGNOSIS — K435 Parastomal hernia without obstruction or  gangrene: Secondary | ICD-10-CM | POA: Diagnosis not present

## 2024-03-06 DIAGNOSIS — M810 Age-related osteoporosis without current pathological fracture: Secondary | ICD-10-CM | POA: Diagnosis not present

## 2024-03-06 DIAGNOSIS — Z923 Personal history of irradiation: Secondary | ICD-10-CM | POA: Diagnosis not present

## 2024-03-06 DIAGNOSIS — R188 Other ascites: Secondary | ICD-10-CM | POA: Diagnosis not present

## 2024-03-06 DIAGNOSIS — C7802 Secondary malignant neoplasm of left lung: Secondary | ICD-10-CM | POA: Diagnosis not present

## 2024-03-06 DIAGNOSIS — C7951 Secondary malignant neoplasm of bone: Secondary | ICD-10-CM | POA: Diagnosis not present

## 2024-03-06 DIAGNOSIS — I700 Atherosclerosis of aorta: Secondary | ICD-10-CM | POA: Diagnosis not present

## 2024-03-06 DIAGNOSIS — Z9221 Personal history of antineoplastic chemotherapy: Secondary | ICD-10-CM | POA: Diagnosis not present

## 2024-03-06 DIAGNOSIS — S42209A Unspecified fracture of upper end of unspecified humerus, initial encounter for closed fracture: Secondary | ICD-10-CM | POA: Diagnosis not present

## 2024-03-06 DIAGNOSIS — Z5189 Encounter for other specified aftercare: Secondary | ICD-10-CM | POA: Diagnosis not present

## 2024-03-06 LAB — CBC WITH DIFFERENTIAL (CANCER CENTER ONLY)
Abs Immature Granulocytes: 0.02 10*3/uL (ref 0.00–0.07)
Basophils Absolute: 0 10*3/uL (ref 0.0–0.1)
Basophils Relative: 0 %
Eosinophils Absolute: 0.1 10*3/uL (ref 0.0–0.5)
Eosinophils Relative: 2 %
HCT: 30.8 % — ABNORMAL LOW (ref 36.0–46.0)
Hemoglobin: 10.1 g/dL — ABNORMAL LOW (ref 12.0–15.0)
Immature Granulocytes: 1 %
Lymphocytes Relative: 24 %
Lymphs Abs: 0.7 10*3/uL (ref 0.7–4.0)
MCH: 32.3 pg (ref 26.0–34.0)
MCHC: 32.8 g/dL (ref 30.0–36.0)
MCV: 98.4 fL (ref 80.0–100.0)
Monocytes Absolute: 0.2 10*3/uL (ref 0.1–1.0)
Monocytes Relative: 7 %
Neutro Abs: 2 10*3/uL (ref 1.7–7.7)
Neutrophils Relative %: 66 %
Platelet Count: 232 10*3/uL (ref 150–400)
RBC: 3.13 MIL/uL — ABNORMAL LOW (ref 3.87–5.11)
RDW: 17.3 % — ABNORMAL HIGH (ref 11.5–15.5)
WBC Count: 3 10*3/uL — ABNORMAL LOW (ref 4.0–10.5)
nRBC: 0 % (ref 0.0–0.2)

## 2024-03-06 LAB — CMP (CANCER CENTER ONLY)
ALT: 21 U/L (ref 0–44)
AST: 36 U/L (ref 15–41)
Albumin: 3.6 g/dL (ref 3.5–5.0)
Alkaline Phosphatase: 70 U/L (ref 38–126)
Anion gap: 9 (ref 5–15)
BUN: 16 mg/dL (ref 8–23)
CO2: 27 mmol/L (ref 22–32)
Calcium: 9.4 mg/dL (ref 8.9–10.3)
Chloride: 100 mmol/L (ref 98–111)
Creatinine: 0.67 mg/dL (ref 0.44–1.00)
GFR, Estimated: >60 mL/min (ref >60)
Glucose, Bld: 102 mg/dL — ABNORMAL HIGH (ref 70–99)
Potassium: 3.7 mmol/L (ref 3.5–5.1)
Sodium: 136 mmol/L (ref 135–145)
Total Bilirubin: 0.7 mg/dL (ref 0.0–1.2)
Total Protein: 7.2 g/dL (ref 6.5–8.1)

## 2024-03-06 MED ORDER — PEGFILGRASTIM INJECTION 6 MG/0.6ML ~~LOC~~
6.0000 mg | PREFILLED_SYRINGE | Freq: Once | SUBCUTANEOUS | Status: AC
  Administered 2024-03-06: 6 mg via SUBCUTANEOUS
  Filled 2024-03-06: qty 0.6

## 2024-03-06 MED ORDER — HEPARIN SOD (PORK) LOCK FLUSH 100 UNIT/ML IV SOLN
500.0000 [IU] | Freq: Once | INTRAVENOUS | Status: DC | PRN
  Filled 2024-03-06: qty 5

## 2024-03-06 MED ORDER — SODIUM CHLORIDE 0.9 % IV SOLN
5.0000 mg/kg | Freq: Once | INTRAVENOUS | Status: AC
  Administered 2024-03-06: 350 mg via INTRAVENOUS
  Filled 2024-03-06: qty 14

## 2024-03-06 MED ORDER — SODIUM CHLORIDE 0.9 % IV SOLN
INTRAVENOUS | Status: DC
Start: 2024-03-06 — End: 2024-03-06
  Filled 2024-03-06: qty 250

## 2024-03-06 NOTE — Assessment & Plan Note (Signed)
 Hb disease, close monitor.

## 2024-03-06 NOTE — Progress Notes (Signed)
 Hematology/Oncology Progress note [phone] [phone]      CHIEF COMPLAINTS/REASON FOR VISIT:  Follow up for rectal cancer  ASSESSMENT & PLAN:   Cancer Staging  Rectal cancer [facility]) Staging form: Colon and Rectum, AJCC 8th Edition - Pathologic stage from 10/06/2019: Stage IIC (ypT4b, pN0, cM0) - Signed by Timmy Forbes, MD on 10/06/2019 - Clinical stage from 06/30/2020: Jacqueline Yoder - Signed by Timmy Forbes, MD on 04/17/2022 - Pathologic: Stage Unknown (rpTX, pNX, cM1) - Signed by Timmy Forbes, MD on 01/31/2021   Rectal cancer [facility]) History of stage IIIC Rectal cancer, s/p TNT, followed by 09/17/19 APR/posterior vaginectomy/TAH/BSO/VY-flap, pT4b pN0 with close vaginal margin 0.2 mm.  Uterus and ovaries negative for malignancy. palliative radiation to vaginal recurrence- 01/19/21 recurrence with lung metastasis.-Palliative -FOLFIRI plus bevacizumab .  Irinotecan  was dropped in November 2022 due to side effects. Negative for UGT1A1*28 - radiographically stable, rise of CEA-July 2023 CT lung metastasis worse--> Dec 2023 PET showed progression in pelvic lymph nodes and bone lesions,--> 2nd line irinotecan  +panitumumab -->CT March 2024 stable. --> June 2024 CT showed mild lung progression/Progressive left iliac bone metastasis, vulvar mass difficult for biopsy in office,  Tempus Liquid biopsy - no actionable variants --> 06/25/2023 CT showed progression of lung nodules [SLD increase 58mm] and iliac inguinal lymphadenopathy--> 07/23/23 switch to dose reduced FOLFOX --> Dec 2024 CT- stable disease. --> Feb 2025 disease progression--> longsurf + Bevacizumab   Labs are reviewed and discussed with patient   CEA nadir was in 490, progressively increases - 10,193  Currently on longsurf [60mg  BID day 1-5 and day 8- 12 every 28-day  + Bevacizumab  D1 proceed with D15 Bevacizumab  Proceed with cycle 3  D15 Longsuf and  Bevacizumab   Anemia due to antineoplastic chemotherapy Hb disease, close monitor.    Chemotherapy induced diarrhea continue lomotil   QID PRN as instructed.  She has not needed to take antidiarrhea.   Chemotherapy-induced neuropathy (HCC) Stable symptoms, Grade 1-2.patient copes well.   Drug-induced neutropenia (HCC) add Prophylactic Nneulasta to D15 treatment of future cycles.   Encounter for antineoplastic chemotherapy Chemotherapy plan as listed above.   History of pulmonary embolism History of PE and bilateral lower extremity DVT  Continue  Eliquis  2.5 mg twice daily for anticoagulation prophylaxis. Recommend leg elevation and compression stocking.   Metastasis to skin [facility]) S/p skin nodule biopsy confirmed metastatic adenocarcinoma consistent with colorectal origin.  No need for palliative RT per Radonc Follow up with wound care  Follow up 2 week[s].  All questions were answered. The patient knows to call the clinic with any problems, questions or concerns.  Timmy Forbes, MD, PhD [facility] Hematology Oncology 03/06/2024      HISTORY OF PRESENTING ILLNESS:  Oncology History  Rectal cancer [facility])  01/23/2019 Initial Diagnosis   Rectal cancer [facility]) Patient initially presented with complaints of postmenopausal bleeding on 08/16/2018.  History of was menopausal vaginal bleeding in 2016 which resulted in cervical polypectomy.  Pathology 02/04/2015 showed cervical polyp, consistent with benign endometrial polyp.  [name] after polypectomy due to anxiety associated with pelvic exams.  pelvic exam on 08/16/2018 reviewed cervical abnormality and from enlarged uterus. Seen by Dr. Denman Fischer on 10/29/2018.  Endometrial biopsy and a Pap smear was performed. 10/29/2018 Pap smear showed adenocarcinoma, favor endometrial origin. 10/29/2018 endometrial biopsy showed endometrioid carcinoma, FIGO grade 1.  10/29/2018- TA & TV Ultrasound revealed: Anteverted uterus measuring 8.7 x 5.6 x 6.4 cm without evidence of focal masses.  The endometrium measuring 24.1 mm  (  thickened) and heterogeneous.  Right and left ovaries not visualized.  No adnexal masses identified.  No free fluid in cul-de-sac.  Patient was seen by Dr. Randalyn Bushman in clinic on 11/13/2018.  Cervical exam reveals 2 cm exophytic irregular mass consistent with malignancy.    11/19/2018 CT chest abdomen pelvis with contrast showed thickened endometrium with some irregularity compatible with the provided diagnosis of endometrial malignancy.  There is a mildly prominent left inguinal node 1.4 cm.  Patient was seen by Dr. Marella Shams on 11/20/2018 and left groin lymph node biopsy was recommended.  11/26/2018 patient underwent left inguinal lymph node biopsy. Pathology showed metastatic adenocarcinoma consistent with colorectal origin.  CDX 2+.  Case was discussed on tumor board.  Recommend colonoscopy for further evaluation.  Patient reports significant weight loss 30 pounds over the last year.  Chronic vaginal spotting. Change of bowel habits the past few months.  More constipated.  Family history positive for brother who has colon cancer prostate cancer.  patient has underwent colonoscopy on 12/03/2018 which reviewed a nonobstructing large mass in the rectum.  Also chronic fistula.  Mass was not circumferential.  This was biopsied with a cold forceps for histology.  Pathology came back hyperplastic polyp negative for dysplasia and malignancy. [name], patient underwent flex sigmoidoscopy on 12/06/2018 with rebiopsy of the rectal mass. This time biopsy results came back positive for invasive colorectal adenocarcinoma, moderately differentiated. Immunotherapy for nearly mismatch repair protein (MMR ) was performed.  There is no loss of MMR expression.  low probability of MSI high.   # Seen by [name] for evaluation of resectability for rectal cancer. In addition, she also had a second opinion with [facility] where her endometrial biopsy pathology was changed to   adenocarcinoma, consistent with colorectal primary.   [name]. She has home health that has been assisting with ostomy care  Patient was also evaluated by [facility] oncology.  Recommendation is to proceed with TNT with concurrent chemoradiation followed by neoadjuvant chemotherapy followed by surgical resection. Patient prefers to have treatment done locally with [facility].   # Oncology Treatment:  02/03/2019- 03/19/2019  concurrent Xeloda  and radiation.  Xeloda  dose 825mg  /m2 BID - rounded to 1650mg  BID- on days of radiation. 04/09/2019, started on FOLFOX with bolus early.  Omitted.  07/16/2019 finished 8 cycles of FOLFOX. 09/17/19 APR/posterior vaginectomy/TAH/BSO/VY-flap pT4b pN0 with close vaginal margin 0.2 mm.  Uterus and ovaries negative for malignancy.   10/06/2019 Cancer Staging   Staging form: Colon and Rectum, AJCC 8th Edition - Pathologic stage from 10/06/2019: Stage IIC (ypT4b, pN0, cM0) - Signed by Timmy Forbes, MD on 10/06/2019   06/30/2020 Cancer Staging   Staging form: Colon and Rectum, AJCC 8th Edition - Clinical stage from 06/30/2020: Jacqueline Yoder - Signed by Timmy Forbes, MD on 04/17/2022 Stage prefix: Recurrence Total positive nodes: 1   06/30/2020 Relapse/Recurrence   vaginal introitus mass biopsied. Pathology is consistent with metastatic colorectal adenocarcinoma    07/20/2020 - 09/13/2020 Chemotherapy   Concurrent chemotherapy with Xeloda  with Radiation.  08/02/2020-08/16/2020, . Xeloda  was held due to neutropenia    08/17/2020 - 09/06/2020 Radiation Therapy   Xeloda  1500 mg twice daily concurrently with radiation    01/31/2021 Cancer Staging   Staging form: Colon and Rectum, AJCC 8th Edition - Pathologic: Stage Unknown (rpTX, pNX, cM1) - Signed by Timmy Forbes, MD on 01/31/2021 Stage prefix: Recurrence   01/31/2021 - 08/20/2021 Chemotherapy   FOLFIRI + Bev  Irinotecan  dose was reduced, eventually 100mg /m2  Irinotecan  was dropped in November 2022 due to side  effects. Negative for UGT1A1*28    09/13/2021 -  Chemotherapy   maintenance 5-FU/bevacizumab     11/28/2021 -  Chemotherapy   5-FU/Irinotecan /bevacizumab . Irinotecan  100 mg/m2 was added back due to progressively increasing CEA.    07/14/2022 Imaging   Bone scan  1. No evidence of skeletal metastatic disease in the hips. 2. Asymmetric increased activity in the RIGHT humeral head is favored degenerative as described above. If pain in the RIGHT shoulder consider further evaluation with plain film or MRI   07/17/2022 Imaging   1. Stable scattered small bilateral pulmonary nodules, largest 8 by 6 mm in the right middle lobe. 2. Subtle chronically stable posterior cortical irregularity and heterogeneity in the left iliac bone, not appreciable on the bone scan of 07/14/2022. This has been present at least over the past year and could reflect radiation therapy related findings although strictly speaking, malignant involvement of the left iliac bone is difficult to exclude. If clinically warranted this could be further assessed with nuclear medicine PET-CT or MRI of the bony pelvis with and without contrast. 3. Pelvic floor laxity with loops of small bowel extending 3 cm below the pubococcygeal line. 4. Aortic and systemic atherosclerosis as detailed above. Aortic Atherosclerosis    12/26/2022 Imaging   CT chest abdomen pelvis w contrast 1. Stable scattered solid pulmonary nodules. 2. Right inguinal and right pelvic lymph nodes which were hypermetabolic on prior PET-CT are stable. 3. Osseous lesion of the proximal right humerus demonstrates increased sclerosis when compared with the prior PET-CT, possibly due to treatment response. 4. 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 5. Aortic Atherosclerosis     06/25/2023 Imaging   CT chest abdomen pelvis w contrast showed 1. Increased size of the solid bilateral pulmonary nodules,consistent with worsening metastatic disease. 2. Increased size of  the right external iliac and inguinal lymph nodes, concerning for worsening metastatic disease. 3. No significant interval change in the left iliac bone osseous metastasis. 4. Enlarged main pulmonary artery measuring 3.6 cm, which can be seen in the setting of pulmonary arterial hypertension. 5.  Aortic Atherosclerosis (ICD10-I70.0)     Miscellaneous   Tempus liquid biopsy showed no actionable variants.  NOTCH1 missense variant, BRCA2 missense variant , MYC missense variant   07/11/2023 Imaging   PET showed 1. Multifocal tracer avid abdominopelvic lymph nodes are identified compatible with metastatic disease. 2. Multifocal bilateral pulmonary nodules are identified compatible with pulmonary metastasis.  3. Diffuse increased radiotracer uptake throughout the marrow space is identified diminishing sensitivity for detecting osseous metastasis. No abnormal increased radiotracer uptake identified above background increased bone marrow activity to indicate metabolically active bone metastases. 4.  Aortic Atherosclerosis   07/23/2023 - 11/13/2023 Chemotherapy   switch to dose reduced FOLFOX    09/16/2023 Imaging   CT abdomen pelvis w contrast  1. Small-bowel obstruction with transition point in the anterior aspect of the lower abdomen, left of midline. This may be secondary to adhesions. 2. Small volume ascites. 3. Prior colon resection with left anterior abdominal wall colostomy. Adjacent parastomal hernia. Small volume fluid within the hernia sac. 4. Stable pulmonary metastatic disease. 5. Stable metastatic lesion of the left iliac wing. 6. Enlarged right external iliac chain lymph node measuring 1.4 cm, previously 1.3 cm. Several mildly prominent retroperitoneal lymph nodes have increased in size from prior but remain subcentimeter in size. 7. Aortic atherosclerosis (ICD10-I70.0).   09/16/2023 -  09/20/2023 Hospital Admission   Hospitalized due to Small bowel obstruction due to adhesions     09/24/2023 Imaging   1. Bilateral pulmonary metastases are all stable from 06/25/2023 chest CT. 2. No thoracic adenopathy. No new or progressive metastatic disease in the chest. 3. Trace bilateral posterior pleural effusions. 4. Two-vessel coronary atherosclerosis. 5. Dilated main pulmonary artery, suggesting pulmonary arterial hypertension. 6.  Aortic Atherosclerosis (ICD10-I70.0).   11/28/2023 Imaging   CT chest abdomen pelvis with contrast 1. Increased size of retroperitoneal, iliac side chain and inguinal lymph nodes, concerning for progressive nodal metastatic disease. 2. Numerous bilateral solid pulmonary nodules are again seen, some of which have increased in size and others are stable in size overall compatible with disease progression. 3. Increased size of the subcapsular lesion in the anterior inferior right lobe of the liver, which has been stable over multiple prior examinations and was not FDG avid on prior PET-CT. Suggest attention on follow-up imaging. 4. 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 5. Questionable wall thickening of matted loops of small bowel in the left lower quadrant, suggest attention on follow-up imaging. 6. Small volume abdominopelvic free fluid. 7.  Aortic Atherosclerosis (ICD10-I70.0).   12/13/2023 -  Chemotherapy   Patient is on Treatment Plan : COLORECTAL Bevacizumab  + Trifluridine /Tipiracil  q28d     01/01/2024 Procedure   Right inguinal crease skin nodule biopsy  Pathology showed dermal deposits of metastatic colon adenocarcinoma, extending to the edge and base.      01/14/2024 Imaging   PET scan showed  1. Progressive disease with progressive left supraclavicular adenopathy, progressive pulmonary metastatic disease, progressive abdominal and pelvic adenopathy, and progressive multifocal skeletal involvement. 2. Substantial multifocal skeletal involvement including the right proximal  humerus, right medial clavicle, left T11 vertebral body, L4 posterior elements, and entire left iliac crest. The permeative lesion of the right humerus may have some extraosseous extension. Similarly there is cortical breakthrough in the large left iliac crest lesion. 3. Hypermetabolic irregular tumor extends along the right inguinal ring region and right hip adductor musculature as well as in the subcutaneous and cutaneous tissues along the right inguinal fold, compatible with tumor involvement. 4. Right eccentric vulvar activity just below the clitoris has a maximum SUV of 7.5, and given the regional soft tissue metastatic disease, a metastatic focus in this vicinity is not excluded. 5. The subcapsular lesion along the anterior inferior right hepatic lobe described on CT of 11/28/2023 is not discernibly hypermetabolic or visible on today's PET-CT. 6. Descending left colostomy with mild diverticulosis. 7.  Aortic Atherosclerosis (ICD10-I70.0).    Metastatic adenocarcinoma (HCC)  12/13/2023 -  Chemotherapy   Patient is on Treatment Plan : COLORECTAL Bevacizumab  + Trifluridine /Tipiracil  q28d         INTERVAL HISTORY Jacqueline Yoder is a 78 y.o. female who has above history reviewed by me presents for follow-up of rectal cancer. She was accompanied by her daughter. + manageable neuropathy symptoms of her fingertips, intermittent,  worse when exposed to cold temperature.  + chronic loose ostomy output, stable, not worse.  + right groin skin mets, open wound, follows up with wound care.  She has not needed to take pain meds. Weight is stable. No fever or chills.   Review of Systems  Constitutional:  Positive for fatigue. Negative for appetite change, chills, fever and unexpected weight change.  HENT:   Negative for hearing loss and voice change.   Eyes:  Negative for eye problems.  Respiratory:  Negative for chest tightness and cough.   Cardiovascular:  Negative for chest pain.   Gastrointestinal:  Positive for diarrhea. Negative for abdominal distention, abdominal pain, blood in stool, constipation and nausea.  Endocrine: Negative for hot flashes.  Genitourinary:  Negative for difficulty urinating and frequency.   Musculoskeletal:  Positive for arthralgias.        right shoulder pain.   Skin:  Positive for rash. Negative for itching.  Neurological:  Positive for numbness. Negative for extremity weakness.  Hematological:  Negative for adenopathy.  Psychiatric/Behavioral:  Negative for confusion.     MEDICAL HISTORY:  Past Medical History:  Diagnosis Date   Allergy    Arthritis    Blood clot in vein    Family history of colon cancer    GERD (gastroesophageal reflux disease)    Hypercholesteremia    Hypertension    Hypertension    Lower extremity edema    Personal history of chemotherapy    Rectal cancer (HCC) 12/2018   Urinary incontinence     SURGICAL HISTORY: Past Surgical History:  Procedure Laterality Date   ABDOMINAL HYSTERECTOMY     CHOLECYSTECTOMY  1971   COLONOSCOPY WITH PROPOFOL  N/A 12/03/2018   Procedure: COLONOSCOPY WITH PROPOFOL ;  Surgeon: Marnee Sink, MD;  Location: [facility] ENDOSCOPY;  Service: Endoscopy;  Laterality: N/A;   COLONOSCOPY WITH PROPOFOL  N/A 07/15/2020   Procedure: COLONOSCOPY WITH PROPOFOL ;  Surgeon: Luke Salaam, MD;  Location: [facility] ENDOSCOPY;  Service: Gastroenterology;  Laterality: N/A;   FLEXIBLE SIGMOIDOSCOPY N/A 12/06/2018   Procedure: FLEXIBLE SIGMOIDOSCOPY;  Surgeon: Luke Salaam, MD;  Location[facility] ENDOSCOPY;  Service: Endoscopy;  Laterality: N/A;   LAPAROSCOPIC COLOSTOMY  01/06/2019   PORTACATH PLACEMENT N/A 04/03/2019   Procedure: INSERTION PORT-A-CATH;  Surgeon: Alben Alma, MD;  Location: [facility] ORS;  Service: General;  Laterality: N/A;    SOCIAL HISTORY: Social History   Socioeconomic History   Marital status: Widowed    Spouse name: Not on file   Number of children: Not on file   Years of education: Not  on file   Highest education level: Some college, no degree  Occupational History   Not on file  Tobacco Use   Smoking status: Former    Current packs/day: 0.00    Types: Cigarettes    Quit date: 12/02/1977    Years since quitting: 46.2   Smokeless tobacco: Former  Building services engineer status: Never Used  Substance and Sexual Activity   Alcohol use: Never   Drug use: Never   Sexual activity: Not Currently    Birth control/protection: None  Other Topics Concern   Not on file  Social History Narrative   Lives with daughter   Social Drivers of Health   Financial Resource Strain: Low Risk  (11/15/2023)   Overall Financial Resource Strain (CARDIA)    Difficulty of Paying Living Expenses: Not hard at all  Food Insecurity: No Food Insecurity (11/15/2023)   Hunger Vital Sign    Worried About Running Out of Food in the Last Year: Never true    Ran Out of Food in the Last Year: Never true  Transportation Needs: No Transportation Needs (11/15/2023)   PRAPARE - Administrator, Civil Service (Medical): No    Lack of Transportation (Non-Medical): No  Physical Activity: Insufficiently Active (11/15/2023)   Exercise Vital Sign    Days of Exercise per Week: 3 days    Minutes of Exercise per Session: 10 min  Stress:  No Stress Concern Present (11/15/2023)   [employer] of Occupational Health - Occupational Stress Questionnaire    Feeling of Stress : Not at all  Social Connections: Moderately Integrated (11/15/2023)   Social Connection and Isolation Panel [NHANES]    Frequency of Communication with Friends and Family: More than three times a week    Frequency of Social Gatherings with Friends and Family: More than three times a week    Attends Religious Services: More than 4 times per year    Active Member of [employer] or Organizations: Yes    Attends Banker Meetings: More than 4 times per year    Marital Status: Widowed  Intimate Partner Violence: Not At Risk  (09/27/2023)   Humiliation, Afraid, Rape, and Kick questionnaire    Fear of Current or Ex-Partner: No    Emotionally Abused: No    Physically Abused: No    Sexually Abused: No    FAMILY HISTORY: Family History  Problem Relation Age of Onset   Colon cancer Brother 84       exposure to chemicals [place]   Hypertension Mother    Stroke Mother    Kidney failure Father    Breast cancer Neg Hx    Ovarian cancer Neg Hx     ALLERGIES:  is allergic to sulfamethoxazole-trimethoprim.  MEDICATIONS:  Current Outpatient Medications  Medication Sig Dispense Refill   acetaminophen  (TYLENOL ) 650 MG CR tablet Take 650 mg by mouth every 8 (eight) hours as needed for pain.     apixaban  (ELIQUIS ) 2.5 MG TABS tablet Take 1 tablet (2.5 mg total) by mouth 2 (two) times daily. 200 tablet 2   Cholecalciferol  (VITAMIN D3) 2000 units capsule Take 2,000 Units by mouth daily.     diclofenac  sodium (VOLTAREN ) 1 % GEL Apply 2 g topically 4 (four) times daily as needed (joint pain).  11   diphenoxylate -atropine  (LOMOTIL ) 2.5-0.025 MG tablet Take 1 tablet by mouth 4 (four) times daily as needed for diarrhea or loose stools. 120 tablet 0   fluticasone  (FLONASE ) 50 MCG/ACT nasal spray Place 1 spray into both nostrils daily as needed for allergies or rhinitis.     lidocaine -prilocaine  (EMLA ) cream Apply 1 Application topically daily as needed. Apply small amount to port and cover with saran wrap 1-2 hours prior to port access 30 g 3   loratadine  (CLARITIN ) 10 MG tablet Take 10 mg by mouth daily.     magnesium  chloride (SLOW-MAG) 64 MG TBEC SR tablet Take 1 tablet (64 mg total) by mouth 2 (two) times daily. 60 tablet 2   Multiple Vitamins-Minerals (ONE-A-DAY WOMENS 50 PLUS PO) Take 1 tablet by mouth daily.      ondansetron  (ZOFRAN ) 8 MG tablet Take 1 tablet (8 mg total) by mouth every 8 (eight) hours as needed for nausea or vomiting. 30 tablet 1   potassium chloride  SA (KLOR-CON  M) 20 MEQ tablet TAKE 1 TABLET BY MOUTH  DAILY 100 tablet 1   prochlorperazine  (COMPAZINE ) 10 MG tablet Take 1 tablet (10 mg total) by mouth every 6 (six) hours as needed (NAUSEA). 30 tablet 1   simvastatin  (ZOCOR ) 40 MG tablet Take 1 tablet (40 mg total) by mouth at bedtime. 100 tablet 3   tiZANidine  (ZANAFLEX ) 4 MG tablet Take 0.5 tablets (2 mg total) by mouth every 8 (eight) hours as needed for muscle spasms. 10 tablet 0   triamterene -hydrochlorothiazide  (DYAZIDE ) 37.5-25 MG capsule Take 1 each (1 capsule total) by mouth daily. 90 capsule 1  trifluridine -tipiracil  (LONSURF ) 20-8.19 MG tablet Take 3 tablets (60 mg of trifluridine  total) by mouth 2 (two) times daily. 1hr after AM & PM meals days 1-5, 8-12. Repeat every 28d. 60 tablet 1   zinc gluconate 50 MG tablet Take 50 mg by mouth daily.     No current facility-administered medications for this visit.   Facility-Administered Medications Ordered in Other Visits  Medication Dose Route Frequency Provider Last Rate Last Admin   0.9 %  sodium chloride  infusion   Intravenous Continuous Timmy Forbes, MD   Stopped at 03/06/24 1004   heparin  lock flush 100 UNIT/ML injection            heparin  lock flush 100 unit/mL  500 Units Intracatheter Once PRN Timmy Forbes, MD         PHYSICAL EXAMINATION: ECOG PERFORMANCE STATUS: 1 - Symptomatic but completely ambulatory  Physical Exam Constitutional:      General: She is not in acute distress. HENT:     Head: Normocephalic and atraumatic.  Eyes:     General: No scleral icterus. Cardiovascular:     Rate and Rhythm: Normal rate.     Heart sounds: Normal heart sounds.  Pulmonary:     Effort: Pulmonary effort is normal. No respiratory distress.     Breath sounds: No wheezing.  Abdominal:     General: Bowel sounds are normal. There is no distension.     Palpations: Abdomen is soft.     Comments: + Colostomy bag   Musculoskeletal:        General: No deformity. Normal range of motion.     Cervical back: Normal range of motion and neck supple.      Right lower leg: Edema present.  Skin:    General: Skin is warm and dry.     Comments: Right groin skin nodules with open wound covered with dressing.   Neurological:     Mental Status: She is alert and oriented to person, place, and time. Mental status is at baseline.       LABORATORY DATA:  I have reviewed the data as listed    Latest Ref Rng & Units 03/06/2024    7:58 AM 02/21/2024    7:48 AM 02/14/2024    7:57 AM  CBC  WBC 4.0 - 10.5 K/uL 3.0  3.6  2.1   Hemoglobin 12.0 - 15.0 g/dL 60.4  54.0  98.1   Hematocrit 36.0 - 46.0 % 30.8  37.0  33.0   Platelets 150 - 400 K/uL 232  176  192       Latest Ref Rng & Units 03/06/2024    7:58 AM 02/21/2024    7:48 AM 02/14/2024    7:57 AM  CMP  Glucose 70 - 99 mg/dL 191  478  295   BUN 8 - 23 mg/dL 16  15  23    Creatinine 0.44 - 1.00 mg/dL 6.21  3.08  6.57   Sodium 135 - 145 mmol/L 136  139  138   Potassium 3.5 - 5.1 mmol/L 3.7  3.9  3.6   Chloride 98 - 111 mmol/L 100  104  103   CO2 22 - 32 mmol/L 27  26  27    Calcium  8.9 - 10.3 mg/dL 9.4  9.4  9.0   Total Protein 6.5 - 8.1 g/dL 7.2  7.2  7.1   Total Bilirubin 0.0 - 1.2 mg/dL 0.7  0.5  0.4   Alkaline Phos 38 - 126 U/L 70  75  73   AST 15 - 41 U/L 36  39  31   ALT 0 - 44 U/L 21  21  18       RADIOGRAPHIC STUDIES: I have personally reviewed the radiological images as listed and agreed with the findings in the report. MM 3D SCREENING MAMMOGRAM BILATERAL BREAST Result Date: 01/25/2024 CLINICAL DATA:  Screening. EXAM: DIGITAL SCREENING BILATERAL MAMMOGRAM WITH TOMOSYNTHESIS AND CAD TECHNIQUE: Bilateral screening digital craniocaudal and mediolateral oblique mammograms were obtained. Bilateral screening digital breast tomosynthesis was performed. The images were evaluated with computer-aided detection. COMPARISON:  Previous exam(s). ACR Breast Density Category b: There are scattered areas of fibroglandular density. FINDINGS: There are no findings suspicious for malignancy. IMPRESSION: No  mammographic evidence of malignancy. A result letter of this screening mammogram will be mailed directly to the patient. RECOMMENDATION: Screening mammogram in one year. (Code:[id number]-B-01Y) BI-RADS CATEGORY  1: Negative. Electronically Signed   By: Dina  Arceo M.D.   On: 01/25/2024 07:45   NM PET Image Restag (PS) Skull Base To Thigh Result Date: 01/17/2024 CLINICAL DATA:  Subsequent treatment strategy for rectal cancer. EXAM: NUCLEAR MEDICINE PET SKULL BASE TO THIGH TECHNIQUE: 8.5 mCi F-18 FDG was injected intravenously. Full-ring PET imaging was performed from the skull base to thigh after the radiotracer. CT data was obtained and used for attenuation correction and anatomic localization. Fasting blood glucose: 81 mg/dl COMPARISON:  16/07/9603, and CT scan of 11/28/2023 FINDINGS: Mediastinal blood pool activity: SUV max 2.1 Liver activity: SUV max NA NECK: No significant abnormal hypermetabolic activity in this region. Incidental CT findings: None. CHEST: Compared to prior PET-CT there is progressive left supraclavicular adenopathy measuring up to 1.2 cm in short axis with maximum SUV of 13.4 (formerly 4.9). Numerous scattered pulmonary nodules throughout both lungs, index lesion in the right middle lobe measuring 1.6 by 1.4 cm on image 68 series 6 with maximum SUV 11.5 (previously 4.3). Many of the nodules are new compared to the prior PET-CT, these are roughly similar in distribution to the CT chest from 11/28/2023. Incidental small lesion in the adipose tissues just above the left scapula, likely a lymph node, measuring 4 mm in short axis on image 35 series 6, maximum SUV 3.3. Incidental CT findings: Right Port-A-Cath tip: Right atrium. ABDOMEN/PELVIS: The subcapsular lesion along the anterior inferior right hepatic lobe described on CT of 11/28/2023 is not discernibly hypermetabolic or visible on today's PET-CT. Abnormal hypermetabolic retrocrural and retroperitoneal adenopathy along with hypermetabolic right  common iliac, bilateral external iliac, and bilateral inguinal adenopathy. Index left periaortic lymph node on image 95 series 6 measures 1.4 cm in short axis with maximum SUV 12.8. Similar size on CT from 11/28/2023, back on 07/09/2023 this node measured 0.7 cm with maximum SUV 4.2. Hypermetabolic irregular tumor extends along the right inguinal ring region and right hip adductor musculature as well as in the subcutaneous and cutaneous tissues along the right inguinal fold, compatible with tumor involvement. The multifocal cutaneous involvement has a maximum SUV of 10.1. Right eccentric vulvar activity just below the clitoris has a maximum SUV of 7.5, and given the regional soft tissue metastatic disease, a metastatic focus in this vicinity is not excluded. Incidental CT findings: Benign renal cysts. No further imaging workup of these lesions is indicated. Atherosclerosis is present, including aortoiliac atherosclerotic disease. Descending left colostomy noted with mild diverticulosis. SKELETON: Substantial multifocal skeletal involvement including the right proximal humerus, right medial clavicle, left T11 vertebral body, L4 posterior elements, and entire left iliac crest. The  permeative lesion of the right humerus may have some extraosseous extension, maximum SUV 16.0. Similarly there is cortical breakthrough in the large left iliac crest lesion which has a maximum SUV of 14.2. Incidental CT findings: None. IMPRESSION: 1. Progressive disease with progressive left supraclavicular adenopathy, progressive pulmonary metastatic disease, progressive abdominal and pelvic adenopathy, and progressive multifocal skeletal involvement. 2. Substantial multifocal skeletal involvement including the right proximal humerus, right medial clavicle, left T11 vertebral body, L4 posterior elements, and entire left iliac crest. The permeative lesion of the right humerus may have some extraosseous extension. Similarly there is cortical  breakthrough in the large left iliac crest lesion. 3. Hypermetabolic irregular tumor extends along the right inguinal ring region and right hip adductor musculature as well as in the subcutaneous and cutaneous tissues along the right inguinal fold, compatible with tumor involvement. 4. Right eccentric vulvar activity just below the clitoris has a maximum SUV of 7.5, and given the regional soft tissue metastatic disease, a metastatic focus in this vicinity is not excluded. 5. The subcapsular lesion along the anterior inferior right hepatic lobe described on CT of 11/28/2023 is not discernibly hypermetabolic or visible on today's PET-CT. 6. Descending left colostomy with mild diverticulosis. 7.  Aortic Atherosclerosis (ICD10-I70.0). Electronically Signed   By: Freida Jes M.D.   On: 01/17/2024 09:48   DG Shoulder Right Result Date: 12/27/2023 CLINICAL DATA:  Chronic right shoulder pain without known injury. EXAM: RIGHT SHOULDER - 2+ VIEW COMPARISON:  None Available. FINDINGS: Possible nondisplaced fracture involving greater tuberosity. No dislocation is noted. Moderate degenerative changes are seen involving the right glenohumeral and acromioclavicular joints. Possible Hill-Sachs deformity suggesting prior dislocations. IMPRESSION: Possible nondisplaced fracture involving greater tuberosity of proximal humerus. Degenerative changes as noted above. Electronically Signed   By: Rosalene Colon M.D.   On: 12/27/2023 09:51   VAS US  LOWER EXTREMITY VENOUS REFLUX Result Date: 12/21/2023  Lower Venous Reflux Study Patient Name:  RANYA FIDDLER  Date of Exam:   12/20/2023 Medical Rec #: [id number]       Accession #:    [id number] Date of Birth: 1946-01-04       Patient Gender: F Patient Age:   56 years Exam Location:  [facility] Procedure:      VAS US  LOWER EXTREMITY VENOUS REFLUX Referring Phys: Sharla Davis --------------------------------------------------------------------------------  Indications:  Pain, Swelling, Edema, and rt knee.  Performing Technologist: Faustine Hoof RVT  Examination Guidelines: A complete evaluation includes B-mode imaging, spectral Doppler, color Doppler, and power Doppler as needed of all accessible portions of each vessel. Bilateral testing is considered an integral part of a complete examination. Limited examinations for reoccurring indications may be performed as noted. The reflux portion of the exam is performed with the patient in reverse Trendelenburg. Significant venous reflux is defined as >500 ms in the superficial venous system, and >1 second in the deep venous system.   Summary: Right: - No evidence of deep vein thrombosis seen in the right lower extremity, from the common femoral through the popliteal veins. - No evidence of superficial venous thrombosis in the right lower extremity. - There is no evidence of venous reflux seen in the right lower extremity. - No evidence of superficial venous reflux seen in the right greater saphenous vein. - No evidence of superficial venous reflux seen in the right short saphenous vein.  *See table(s) above for measurements and observations. Electronically signed by Devon Fogo MD on 12/21/2023 at 8:33:40 AM.    Final  MM 3D SCREENING MAMMOGRAM BILATERAL BREAST Result Date: 01/25/2024 CLINICAL DATA:  Screening. EXAM: DIGITAL SCREENING BILATERAL MAMMOGRAM WITH TOMOSYNTHESIS AND CAD TECHNIQUE: Bilateral screening digital craniocaudal and mediolateral oblique mammograms were obtained. Bilateral screening digital breast tomosynthesis was performed. The images were evaluated with computer-aided detection. COMPARISON:  Previous exam(s). ACR Breast Density Category b: There are scattered areas of fibroglandular density. FINDINGS: There are no findings suspicious for malignancy. IMPRESSION: No mammographic evidence of malignancy. A result letter of this screening mammogram will be mailed directly to the patient. RECOMMENDATION:  Screening mammogram in one year. (Code:[id number]-B-01Y) BI-RADS CATEGORY  1: Negative. Electronically Signed   By: Dina  Arceo M.D.   On: 01/25/2024 07:45   NM PET Image Restag (PS) Skull Base To Thigh Result Date: 01/17/2024 CLINICAL DATA:  Subsequent treatment strategy for rectal cancer. EXAM: NUCLEAR MEDICINE PET SKULL BASE TO THIGH TECHNIQUE: 8.5 mCi F-18 FDG was injected intravenously. Full-ring PET imaging was performed from the skull base to thigh after the radiotracer. CT data was obtained and used for attenuation correction and anatomic localization. Fasting blood glucose: 81 mg/dl COMPARISON:  16/07/9603, and CT scan of 11/28/2023 FINDINGS: Mediastinal blood pool activity: SUV max 2.1 Liver activity: SUV max NA NECK: No significant abnormal hypermetabolic activity in this region. Incidental CT findings: None. CHEST: Compared to prior PET-CT there is progressive left supraclavicular adenopathy measuring up to 1.2 cm in short axis with maximum SUV of 13.4 (formerly 4.9). Numerous scattered pulmonary nodules throughout both lungs, index lesion in the right middle lobe measuring 1.6 by 1.4 cm on image 68 series 6 with maximum SUV 11.5 (previously 4.3). Many of the nodules are new compared to the prior PET-CT, these are roughly similar in distribution to the CT chest from 11/28/2023. Incidental small lesion in the adipose tissues just above the left scapula, likely a lymph node, measuring 4 mm in short axis on image 35 series 6, maximum SUV 3.3. Incidental CT findings: Right Port-A-Cath tip: Right atrium. ABDOMEN/PELVIS: The subcapsular lesion along the anterior inferior right hepatic lobe described on CT of 11/28/2023 is not discernibly hypermetabolic or visible on today's PET-CT. Abnormal hypermetabolic retrocrural and retroperitoneal adenopathy along with hypermetabolic right common iliac, bilateral external iliac, and bilateral inguinal adenopathy. Index left periaortic lymph node on image 95 series 6 measures  1.4 cm in short axis with maximum SUV 12.8. Similar size on CT from 11/28/2023, back on 07/09/2023 this node measured 0.7 cm with maximum SUV 4.2. Hypermetabolic irregular tumor extends along the right inguinal ring region and right hip adductor musculature as well as in the subcutaneous and cutaneous tissues along the right inguinal fold, compatible with tumor involvement. The multifocal cutaneous involvement has a maximum SUV of 10.1. Right eccentric vulvar activity just below the clitoris has a maximum SUV of 7.5, and given the regional soft tissue metastatic disease, a metastatic focus in this vicinity is not excluded. Incidental CT findings: Benign renal cysts. No further imaging workup of these lesions is indicated. Atherosclerosis is present, including aortoiliac atherosclerotic disease. Descending left colostomy noted with mild diverticulosis. SKELETON: Substantial multifocal skeletal involvement including the right proximal humerus, right medial clavicle, left T11 vertebral body, L4 posterior elements, and entire left iliac crest. The permeative lesion of the right humerus may have some extraosseous extension, maximum SUV 16.0. Similarly there is cortical breakthrough in the large left iliac crest lesion which has a maximum SUV of 14.2. Incidental CT findings: None. IMPRESSION: 1. Progressive disease with progressive left supraclavicular adenopathy, progressive pulmonary metastatic  disease, progressive abdominal and pelvic adenopathy, and progressive multifocal skeletal involvement. 2. Substantial multifocal skeletal involvement including the right proximal humerus, right medial clavicle, left T11 vertebral body, L4 posterior elements, and entire left iliac crest. The permeative lesion of the right humerus may have some extraosseous extension. Similarly there is cortical breakthrough in the large left iliac crest lesion. 3. Hypermetabolic irregular tumor extends along the right inguinal ring region and  right hip adductor musculature as well as in the subcutaneous and cutaneous tissues along the right inguinal fold, compatible with tumor involvement. 4. Right eccentric vulvar activity just below the clitoris has a maximum SUV of 7.5, and given the regional soft tissue metastatic disease, a metastatic focus in this vicinity is not excluded. 5. The subcapsular lesion along the anterior inferior right hepatic lobe described on CT of 11/28/2023 is not discernibly hypermetabolic or visible on today's PET-CT. 6. Descending left colostomy with mild diverticulosis. 7.  Aortic Atherosclerosis (ICD10-I70.0). Electronically Signed   By: Freida Jes M.D.   On: 01/17/2024 09:48   DG Shoulder Right Result Date: 12/27/2023 CLINICAL DATA:  Chronic right shoulder pain without known injury. EXAM: RIGHT SHOULDER - 2+ VIEW COMPARISON:  None Available. FINDINGS: Possible nondisplaced fracture involving greater tuberosity. No dislocation is noted. Moderate degenerative changes are seen involving the right glenohumeral and acromioclavicular joints. Possible Hill-Sachs deformity suggesting prior dislocations. IMPRESSION: Possible nondisplaced fracture involving greater tuberosity of proximal humerus. Degenerative changes as noted above. Electronically Signed   By: Rosalene Colon M.D.   On: 12/27/2023 09:51   VAS US  LOWER EXTREMITY VENOUS REFLUX Result Date: 12/21/2023  Lower Venous Reflux Study Patient Name:  CING McMillin  Date of Exam:   12/20/2023 Medical Rec #: [id number]       Accession #:    [id number] Date of Birth: 11-25-1945       Patient Gender: F Patient Age:   78 years [facility]am [facility] Procedure:      VAS US  LOWER EXTREMITY VENOUS REFLUX Referring Phys: Sharla Davis --------------------------------------------------------------------------------  Indications: Pain, Swelling, Edema, and rt knee.  Performing Technologist: Faustine Hoof RVT  Examination Guidelines: A complete evaluation includes  B-mode imaging, spectral Doppler, color Doppler, and power Doppler as needed of all accessible portions of each vessel. Bilateral testing is considered an integral part of a complete examination. Limited examinations for reoccurring indications may be performed as noted. The reflux portion of the exam is performed with the patient in reverse Trendelenburg. Significant venous reflux is defined as >500 ms in the superficial venous system, and >1 second in the deep venous system.   Summary: Right: - No evidence of deep vein thrombosis seen in the right lower extremity, from the common femoral through the popliteal veins. - No evidence of superficial venous thrombosis in the right lower extremity. - There is no evidence of venous reflux seen in the right lower extremity. - No evidence of superficial venous reflux seen in the right greater saphenous vein. - No evidence of superficial venous reflux seen in the right short saphenous vein.  *See table(s) above for measurements and observations. Electronically signed by Devon Fogo MD on 12/21/2023 at 8:33:40 AM.    Final

## 2024-03-06 NOTE — Assessment & Plan Note (Signed)
 History of PE and bilateral lower extremity DVT  Continue  Eliquis  2.5 mg twice daily for anticoagulation prophylaxis. Recommend leg elevation and compression stocking.

## 2024-03-06 NOTE — Assessment & Plan Note (Signed)
 Stable symptoms, Grade 1-2.patient copes well.

## 2024-03-06 NOTE — Assessment & Plan Note (Signed)
 Chemotherapy plan as listed above

## 2024-03-06 NOTE — Assessment & Plan Note (Signed)
 add Prophylactic Nneulasta to D15 treatment of future cycles.

## 2024-03-06 NOTE — Progress Notes (Signed)
 No concerns today

## 2024-03-06 NOTE — Patient Instructions (Signed)
 [facility] - A DEPT OF [facility]  Discharge Instructions: Thank you for choosing [facility] to provide your oncology and hematology care.  If you have a lab appointment with the Cancer Center, please go directly to the Cancer Center and check in at the registration area.  Wear comfortable clothing and clothing appropriate for easy access to any Portacath or PICC line.   We strive to give you quality time with your provider. You may need to reschedule your appointment if you arrive late (15 or more minutes).  Arriving late affects you and other patients whose appointments are after yours.  Also, if you miss three or more appointments without notifying the office, you may be dismissed from the clinic at the provider's discretion.      For prescription refill requests, have your pharmacy contact our office and allow 72 hours for refills to be completed.    Today you received the following chemotherapy and/or immunotherapy agents- [facility]ASI      To help prevent nausea and vomiting after your treatment, we encourage you to take your nausea medication as directed.  BELOW ARE SYMPTOMS THAT SHOULD BE REPORTED IMMEDIATELY: *FEVER GREATER THAN 100.4 F (38 C) OR HIGHER *CHILLS OR SWEATING *NAUSEA AND VOMITING THAT IS NOT CONTROLLED WITH YOUR NAUSEA MEDICATION *UNUSUAL SHORTNESS OF BREATH *UNUSUAL BRUISING OR BLEEDING *URINARY PROBLEMS (pain or burning when urinating, or frequent urination) *BOWEL PROBLEMS (unusual diarrhea, constipation, pain near the anus) TENDERNESS IN MOUTH AND THROAT WITH OR WITHOUT PRESENCE OF ULCERS (sore throat, sores in mouth, or a toothache) UNUSUAL RASH, SWELLING OR PAIN  UNUSUAL VAGINAL DISCHARGE OR ITCHING   Items with * indicate a potential emergency and should be followed up as soon as possible or go to the Emergency Department if any problems should occur.  Please show the CHEMOTHERAPY ALERT CARD or IMMUNOTHERAPY ALERT  CARD at check-in to the Emergency Department and triage nurse.  Should you have questions after your visit or need to cancel or reschedule your appointment, please contact [facility] - A DEPT OF [facility]  [phone] and follow the prompts.  Office hours are 8:00 a.m. to 4:30 p.m. Monday - Friday. Please note that voicemails left after 4:00 p.m. may not be returned until the following business day.  We are closed weekends and major holidays. You have access to a nurse at all times for urgent questions. Please call the main number to the clinic [phone] and follow the prompts.  For any non-urgent questions, you may also contact your provider using MyChart. We now offer e-Visits for anyone 76 and older to request care online for non-urgent symptoms. For details visit [email or web address].   Also download the MyChart app! Go to the app store, search "MyChart", open the app, select [place], and log in with your MyChart username and password.

## 2024-03-06 NOTE — Assessment & Plan Note (Signed)
 continue lomotil  QID PRN as instructed.  She has not needed to take antidiarrhea.

## 2024-03-06 NOTE — Assessment & Plan Note (Addendum)
 History of stage IIIC Rectal cancer, s/p TNT, followed by 09/17/19 APR/posterior vaginectomy/TAH/BSO/VY-flap, pT4b pN0 with close vaginal margin 0.2 mm.  Uterus and ovaries negative for malignancy. palliative radiation to vaginal recurrence- 01/19/21 recurrence with lung metastasis.-Palliative -FOLFIRI plus bevacizumab .  Irinotecan  was dropped in November 2022 due to side effects. Negative for UGT1A1*28 - radiographically stable, rise of CEA-July 2023 CT lung metastasis worse--> Dec 2023 PET showed progression in pelvic lymph nodes and bone lesions,--> 2nd line irinotecan  +panitumumab -->CT March 2024 stable. --> June 2024 CT showed mild lung progression/Progressive left iliac bone metastasis, vulvar mass difficult for biopsy in office,  Tempus Liquid biopsy - no actionable variants --> 06/25/2023 CT showed progression of lung nodules [SLD increase 34mm] and iliac inguinal lymphadenopathy--> 07/23/23 switch to dose reduced FOLFOX --> Dec 2024 CT- stable disease. --> Feb 2025 disease progression--> longsurf + Bevacizumab   Labs are reviewed and discussed with patient   CEA nadir was in 490, progressively increases - 10,193  Currently on longsurf [60mg  BID day 1-5 and day 8- 12 every 28-day  + Bevacizumab  D1 proceed with D15 Bevacizumab  Proceed with cycle 3  D15 Longsuf and  Bevacizumab 

## 2024-03-06 NOTE — Assessment & Plan Note (Signed)
 S/p skin nodule biopsy confirmed metastatic adenocarcinoma consistent with colorectal origin.  No need for palliative RT per Radonc Follow up with wound care

## 2024-03-07 ENCOUNTER — Encounter: Admitting: Physician Assistant

## 2024-03-07 DIAGNOSIS — Z85048 Personal history of other malignant neoplasm of rectum, rectosigmoid junction, and anus: Secondary | ICD-10-CM | POA: Diagnosis not present

## 2024-03-07 DIAGNOSIS — Z7901 Long term (current) use of anticoagulants: Secondary | ICD-10-CM | POA: Diagnosis not present

## 2024-03-07 DIAGNOSIS — C4499 Other specified malignant neoplasm of skin, unspecified: Secondary | ICD-10-CM | POA: Diagnosis not present

## 2024-03-07 DIAGNOSIS — Z86718 Personal history of other venous thrombosis and embolism: Secondary | ICD-10-CM | POA: Diagnosis not present

## 2024-03-07 DIAGNOSIS — C3412 Malignant neoplasm of upper lobe, left bronchus or lung: Secondary | ICD-10-CM | POA: Diagnosis not present

## 2024-03-07 DIAGNOSIS — L98492 Non-pressure chronic ulcer of skin of other sites with fat layer exposed: Secondary | ICD-10-CM | POA: Diagnosis not present

## 2024-03-13 ENCOUNTER — Ambulatory Visit: Admitting: Physician Assistant

## 2024-03-20 ENCOUNTER — Other Ambulatory Visit (HOSPITAL_COMMUNITY): Payer: Self-pay

## 2024-03-20 ENCOUNTER — Inpatient Hospital Stay (HOSPITAL_BASED_OUTPATIENT_CLINIC_OR_DEPARTMENT_OTHER): Admitting: Oncology

## 2024-03-20 ENCOUNTER — Other Ambulatory Visit: Payer: Self-pay

## 2024-03-20 ENCOUNTER — Encounter: Payer: Self-pay | Admitting: Oncology

## 2024-03-20 ENCOUNTER — Inpatient Hospital Stay

## 2024-03-20 ENCOUNTER — Inpatient Hospital Stay: Attending: Oncology

## 2024-03-20 VITALS — BP 119/63 | HR 95 | Temp 98.7°F | Resp 18 | Wt 155.0 lb

## 2024-03-20 DIAGNOSIS — C792 Secondary malignant neoplasm of skin: Secondary | ICD-10-CM | POA: Diagnosis not present

## 2024-03-20 DIAGNOSIS — C799 Secondary malignant neoplasm of unspecified site: Secondary | ICD-10-CM

## 2024-03-20 DIAGNOSIS — Z5111 Encounter for antineoplastic chemotherapy: Secondary | ICD-10-CM | POA: Insufficient documentation

## 2024-03-20 DIAGNOSIS — G620 Drug-induced polyneuropathy: Secondary | ICD-10-CM

## 2024-03-20 DIAGNOSIS — I10 Essential (primary) hypertension: Secondary | ICD-10-CM | POA: Diagnosis not present

## 2024-03-20 DIAGNOSIS — T451X5A Adverse effect of antineoplastic and immunosuppressive drugs, initial encounter: Secondary | ICD-10-CM

## 2024-03-20 DIAGNOSIS — Z87891 Personal history of nicotine dependence: Secondary | ICD-10-CM | POA: Diagnosis not present

## 2024-03-20 DIAGNOSIS — D6481 Anemia due to antineoplastic chemotherapy: Secondary | ICD-10-CM | POA: Insufficient documentation

## 2024-03-20 DIAGNOSIS — C7800 Secondary malignant neoplasm of unspecified lung: Secondary | ICD-10-CM | POA: Diagnosis not present

## 2024-03-20 DIAGNOSIS — D702 Other drug-induced agranulocytosis: Secondary | ICD-10-CM | POA: Diagnosis not present

## 2024-03-20 DIAGNOSIS — K56699 Other intestinal obstruction unspecified as to partial versus complete obstruction: Secondary | ICD-10-CM | POA: Insufficient documentation

## 2024-03-20 DIAGNOSIS — R188 Other ascites: Secondary | ICD-10-CM | POA: Diagnosis not present

## 2024-03-20 DIAGNOSIS — R197 Diarrhea, unspecified: Secondary | ICD-10-CM | POA: Insufficient documentation

## 2024-03-20 DIAGNOSIS — C7951 Secondary malignant neoplasm of bone: Secondary | ICD-10-CM | POA: Insufficient documentation

## 2024-03-20 DIAGNOSIS — Z933 Colostomy status: Secondary | ICD-10-CM | POA: Insufficient documentation

## 2024-03-20 DIAGNOSIS — R634 Abnormal weight loss: Secondary | ICD-10-CM | POA: Diagnosis not present

## 2024-03-20 DIAGNOSIS — Z86711 Personal history of pulmonary embolism: Secondary | ICD-10-CM

## 2024-03-20 DIAGNOSIS — Z5189 Encounter for other specified aftercare: Secondary | ICD-10-CM | POA: Insufficient documentation

## 2024-03-20 DIAGNOSIS — E7800 Pure hypercholesterolemia, unspecified: Secondary | ICD-10-CM | POA: Insufficient documentation

## 2024-03-20 DIAGNOSIS — Z90722 Acquired absence of ovaries, bilateral: Secondary | ICD-10-CM | POA: Diagnosis not present

## 2024-03-20 DIAGNOSIS — Z79899 Other long term (current) drug therapy: Secondary | ICD-10-CM | POA: Insufficient documentation

## 2024-03-20 DIAGNOSIS — Z7901 Long term (current) use of anticoagulants: Secondary | ICD-10-CM | POA: Insufficient documentation

## 2024-03-20 DIAGNOSIS — K521 Toxic gastroenteritis and colitis: Secondary | ICD-10-CM | POA: Diagnosis not present

## 2024-03-20 DIAGNOSIS — Z90710 Acquired absence of both cervix and uterus: Secondary | ICD-10-CM | POA: Diagnosis not present

## 2024-03-20 DIAGNOSIS — Z9049 Acquired absence of other specified parts of digestive tract: Secondary | ICD-10-CM | POA: Insufficient documentation

## 2024-03-20 DIAGNOSIS — K219 Gastro-esophageal reflux disease without esophagitis: Secondary | ICD-10-CM | POA: Insufficient documentation

## 2024-03-20 DIAGNOSIS — I700 Atherosclerosis of aorta: Secondary | ICD-10-CM | POA: Diagnosis not present

## 2024-03-20 DIAGNOSIS — Z800 Family history of malignant neoplasm of digestive organs: Secondary | ICD-10-CM | POA: Insufficient documentation

## 2024-03-20 DIAGNOSIS — C20 Malignant neoplasm of rectum: Secondary | ICD-10-CM

## 2024-03-20 DIAGNOSIS — Z85048 Personal history of other malignant neoplasm of rectum, rectosigmoid junction, and anus: Secondary | ICD-10-CM | POA: Insufficient documentation

## 2024-03-20 DIAGNOSIS — Z86718 Personal history of other venous thrombosis and embolism: Secondary | ICD-10-CM | POA: Diagnosis not present

## 2024-03-20 DIAGNOSIS — Z923 Personal history of irradiation: Secondary | ICD-10-CM | POA: Insufficient documentation

## 2024-03-20 DIAGNOSIS — Z9221 Personal history of antineoplastic chemotherapy: Secondary | ICD-10-CM | POA: Insufficient documentation

## 2024-03-20 LAB — CMP (CANCER CENTER ONLY)
ALT: 21 U/L (ref 0–44)
AST: 36 U/L (ref 15–41)
Albumin: 4 g/dL (ref 3.5–5.0)
Alkaline Phosphatase: 77 U/L (ref 38–126)
Anion gap: 10 (ref 5–15)
BUN: 25 mg/dL — ABNORMAL HIGH (ref 8–23)
CO2: 26 mmol/L (ref 22–32)
Calcium: 9.3 mg/dL (ref 8.9–10.3)
Chloride: 102 mmol/L (ref 98–111)
Creatinine: 0.78 mg/dL (ref 0.44–1.00)
GFR, Estimated: >60 mL/min (ref >60)
Glucose, Bld: 97 mg/dL (ref 70–99)
Potassium: 3.6 mmol/L (ref 3.5–5.1)
Sodium: 138 mmol/L (ref 135–145)
Total Bilirubin: 0.5 mg/dL (ref 0.0–1.2)
Total Protein: 7.4 g/dL (ref 6.5–8.1)

## 2024-03-20 LAB — CBC WITH DIFFERENTIAL (CANCER CENTER ONLY)
Abs Immature Granulocytes: 0.03 10*3/uL (ref 0.00–0.07)
Basophils Absolute: 0 10*3/uL (ref 0.0–0.1)
Basophils Relative: 0 %
Eosinophils Absolute: 0.1 10*3/uL (ref 0.0–0.5)
Eosinophils Relative: 2 %
HCT: 36.4 % (ref 36.0–46.0)
Hemoglobin: 11.8 g/dL — ABNORMAL LOW (ref 12.0–15.0)
Immature Granulocytes: 1 %
Lymphocytes Relative: 27 %
Lymphs Abs: 1.4 10*3/uL (ref 0.7–4.0)
MCH: 33.1 pg (ref 26.0–34.0)
MCHC: 32.4 g/dL (ref 30.0–36.0)
MCV: 102.2 fL — ABNORMAL HIGH (ref 80.0–100.0)
Monocytes Absolute: 0.6 10*3/uL (ref 0.1–1.0)
Monocytes Relative: 11 %
Neutro Abs: 3 10*3/uL (ref 1.7–7.7)
Neutrophils Relative %: 59 %
Platelet Count: 228 10*3/uL (ref 150–400)
RBC: 3.56 MIL/uL — ABNORMAL LOW (ref 3.87–5.11)
RDW: 17.9 % — ABNORMAL HIGH (ref 11.5–15.5)
WBC Count: 5 10*3/uL (ref 4.0–10.5)
nRBC: 0 % (ref 0.0–0.2)

## 2024-03-20 LAB — TOTAL PROTEIN, URINE DIPSTICK: Protein, ur: NEGATIVE mg/dL

## 2024-03-20 MED ORDER — SODIUM CHLORIDE 0.9% FLUSH
10.0000 mL | INTRAVENOUS | Status: DC | PRN
  Administered 2024-03-20: 10 mL
  Filled 2024-03-20: qty 10

## 2024-03-20 MED ORDER — SODIUM CHLORIDE 0.9 % IV SOLN
5.0000 mg/kg | Freq: Once | INTRAVENOUS | Status: AC
  Administered 2024-03-20: 350 mg via INTRAVENOUS
  Filled 2024-03-20: qty 14

## 2024-03-20 MED ORDER — SODIUM CHLORIDE 0.9 % IV SOLN
INTRAVENOUS | Status: DC
  Filled 2024-03-20: qty 250

## 2024-03-20 MED ORDER — HEPARIN SOD (PORK) LOCK FLUSH 100 UNIT/ML IV SOLN
500.0000 [IU] | Freq: Once | INTRAVENOUS | Status: AC | PRN
  Administered 2024-03-20: 500 [IU]
  Filled 2024-03-20: qty 5

## 2024-03-20 NOTE — Assessment & Plan Note (Signed)
 History of PE and bilateral lower extremity DVT  Continue  Eliquis  2.5 mg twice daily for anticoagulation prophylaxis. Recommend leg elevation and compression stocking.

## 2024-03-20 NOTE — Assessment & Plan Note (Signed)
 Chemotherapy plan as listed above

## 2024-03-20 NOTE — Assessment & Plan Note (Signed)
 continue lomotil  QID PRN as instructed.  She has not needed to take antidiarrhea.

## 2024-03-20 NOTE — Assessment & Plan Note (Addendum)
 Prophylactic Nneulasta to D15 treatment of each cycle

## 2024-03-20 NOTE — Progress Notes (Signed)
 Specialty Pharmacy Ongoing Clinical Assessment Note  Jacqueline Yoder is a 78 y.o. female who is being followed by the specialty pharmacy service for RxSp Oncology   Patient's specialty medication(s) reviewed today: Trifluridine -Tipiracil  (Lonsurf )   Missed doses in the last 4 weeks: 0   Patient/Caregiver did not have any additional questions or concerns.   Therapeutic benefit summary: Patient is achieving benefit   Adverse events/side effects summary: Experienced adverse events/side effects (fatigue, tolerable at this time)   Patient's therapy is appropriate to: Continue    Goals Addressed             This Visit's Progress    Slow Disease Progression   No change    Patient is initiating therapy. Patient will maintain adherence.  Awaiting CEA results that were drawn today (03/20/24).         Follow up: 3 months  Malachi Screws Specialty Pharmacist

## 2024-03-20 NOTE — Progress Notes (Signed)
 Patient is doing ok, she said that she is seeing a wound care provider who has been able to provide her some relief since the wound has been burning pretty bad. She is still having trouble with her appetite due to nothing tasting good. No new questions for the doctor today.

## 2024-03-20 NOTE — Patient Instructions (Signed)
 [facility] - A DEPT OF [facility]  Discharge Instructions: Thank you for choosing [facility] to provide your oncology and hematology care.  If you have a lab appointment with the Cancer Center, please go directly to the Cancer Center and check in at the registration area.  Wear comfortable clothing and clothing appropriate for easy access to any Portacath or PICC line.   We strive to give you quality time with your provider. You may need to reschedule your appointment if you arrive late (15 or more minutes).  Arriving late affects you and other patients whose appointments are after yours.  Also, if you miss three or more appointments without notifying the office, you may be dismissed from the clinic at the provider's discretion.      For prescription refill requests, have your pharmacy contact our office and allow 72 hours for refills to be completed.    Today you received the following chemotherapy and/or immunotherapy agents- bevacizumab      To help prevent nausea and vomiting after your treatment, we encourage you to take your nausea medication as directed.  BELOW ARE SYMPTOMS THAT SHOULD BE REPORTED IMMEDIATELY: *FEVER GREATER THAN 100.4 F (38 C) OR HIGHER *CHILLS OR SWEATING *NAUSEA AND VOMITING THAT IS NOT CONTROLLED WITH YOUR NAUSEA MEDICATION *UNUSUAL SHORTNESS OF BREATH *UNUSUAL BRUISING OR BLEEDING *URINARY PROBLEMS (pain or burning when urinating, or frequent urination) *BOWEL PROBLEMS (unusual diarrhea, constipation, pain near the anus) TENDERNESS IN MOUTH AND THROAT WITH OR WITHOUT PRESENCE OF ULCERS (sore throat, sores in mouth, or a toothache) UNUSUAL RASH, SWELLING OR PAIN  UNUSUAL VAGINAL DISCHARGE OR ITCHING   Items with * indicate a potential emergency and should be followed up as soon as possible or go to the Emergency Department if any problems should occur.  Please show the CHEMOTHERAPY ALERT CARD or IMMUNOTHERAPY  ALERT CARD at check-in to the Emergency Department and triage nurse.  Should you have questions after your visit or need to cancel or reschedule your appointment, please contact [facility] - A DEPT OF [facility]  [phone] and follow the prompts.  Office hours are 8:00 a.m. to 4:30 p.m. Monday - Friday. Please note that voicemails left after 4:00 p.m. may not be returned until the following business day.  We are closed weekends and major holidays. You have access to a nurse at all times for urgent questions. Please call the main number to the clinic [phone] and follow the prompts.  For any non-urgent questions, you may also contact your provider using MyChart. We now offer e-Visits for anyone 22 and older to request care online for non-urgent symptoms. For details visit [email or web address].   Also download the MyChart app! Go to the app store, search "MyChart", open the app, select [place], and log in with your MyChart username and password.

## 2024-03-20 NOTE — Assessment & Plan Note (Signed)
 S/p skin nodule biopsy confirmed metastatic adenocarcinoma consistent with colorectal origin.  No need for palliative RT per Radonc Follow up with wound care

## 2024-03-20 NOTE — Assessment & Plan Note (Addendum)
 Hb stable close monitor.

## 2024-03-20 NOTE — Assessment & Plan Note (Signed)
 History of stage IIIC Rectal cancer, s/p TNT, followed by 09/17/19 APR/posterior vaginectomy/TAH/BSO/VY-flap, pT4b pN0 with close vaginal margin 0.2 mm.  Uterus and ovaries negative for malignancy. palliative radiation to vaginal recurrence- 01/19/21 recurrence with lung metastasis.-Palliative -FOLFIRI plus bevacizumab .  Irinotecan  was dropped in November 2022 due to side effects. Negative for UGT1A1*28 - radiographically stable, rise of CEA-July 2023 CT lung metastasis worse--> Dec 2023 PET showed progression in pelvic lymph nodes and bone lesions,--> 2nd line irinotecan  +panitumumab -->CT March 2024 stable. --> June 2024 CT showed mild lung progression/Progressive left iliac bone metastasis, vulvar mass difficult for biopsy in office,  Tempus Liquid biopsy - no actionable variants --> 06/25/2023 CT showed progression of lung nodules [SLD increase 34mm] and iliac inguinal lymphadenopathy--> 07/23/23 switch to dose reduced FOLFOX --> Dec 2024 CT- stable disease. --> Feb 2025 disease progression--> longsurf + Bevacizumab   Labs are reviewed and discussed with patient   CEA nadir was in 490, progressively increases - 10,193  Currently on longsurf [60mg  BID day 1-5 and day 8- 12 every 28-day  + Bevacizumab  D1 proceed with D15 Bevacizumab  Proceed with cycle 3  D15 Longsuf and  Bevacizumab 

## 2024-03-20 NOTE — Progress Notes (Signed)
 Hematology/Oncology Progress note [phone] [phone]      CHIEF COMPLAINTS/REASON FOR VISIT:  Follow up for rectal cancer  ASSESSMENT & PLAN:   Cancer Staging  Rectal cancer [facility]) Staging form: Colon and Rectum, AJCC 8th Edition - Pathologic stage from 10/06/2019: Stage IIC (ypT4b, pN0, cM0) - Signed by Timmy Forbes, MD on 10/06/2019 - Clinical stage from 06/30/2020: [name] - Signed by Timmy Forbes, MD on 04/17/2022 - Pathologic: Stage Unknown (rpTX, pNX, cM1) - Signed by Timmy Forbes, MD on 01/31/2021   Rectal cancer [facility]) History of stage IIIC Rectal cancer, s/p TNT, followed by 09/17/19 APR/posterior vaginectomy/TAH/BSO/VY-flap, pT4b pN0 with close vaginal margin 0.2 mm.  Uterus and ovaries negative for malignancy. palliative radiation to vaginal recurrence- 01/19/21 recurrence with lung metastasis.-Palliative -FOLFIRI plus bevacizumab .  Irinotecan  was dropped in November 2022 due to side effects. Negative for UGT1A1*28 - radiographically stable, rise of CEA-July 2023 CT lung metastasis worse--> Dec 2023 PET showed progression in pelvic lymph nodes and bone lesions,--> 2nd line irinotecan  +panitumumab -->CT March 2024 stable. --> June 2024 CT showed mild lung progression/Progressive left iliac bone metastasis, vulvar mass difficult for biopsy in office,  Tempus Liquid biopsy - no actionable variants --> 06/25/2023 CT showed progression of lung nodules [SLD increase 48mm] and iliac inguinal lymphadenopathy--> 07/23/23 switch to dose reduced FOLFOX --> Dec 2024 CT- stable disease. --> Feb 2025 disease progression--> longsurf + Bevacizumab   Labs are reviewed and discussed with patient   CEA nadir was in 490, progressively increases - 10,193  Currently on longsurf [60mg  BID day 1-5 and day 8- 12 every 28-day  + Bevacizumab  D1 proceed with D15 Bevacizumab  Proceed with cycle 3  D15 Longsuf and  Bevacizumab   Anemia due to antineoplastic chemotherapy Hb stable close monitor.    Chemotherapy induced diarrhea continue lomotil   QID PRN as instructed.  She has not needed to take antidiarrhea.   Chemotherapy-induced neuropathy (HCC) Stable symptoms, Grade 1-2.patient copes well.   Drug-induced neutropenia (HCC) Prophylactic Nneulasta to D15 treatment of each cycle  Encounter for antineoplastic chemotherapy Chemotherapy plan as listed above.   History of pulmonary embolism History of PE and bilateral lower extremity DVT  Continue  Eliquis  2.5 mg twice daily for anticoagulation prophylaxis. Recommend leg elevation and compression stocking.   Metastasis to skin [facility]) S/p skin nodule biopsy confirmed metastatic adenocarcinoma consistent with colorectal origin.  No need for palliative RT per Radonc Follow up with wound care  Follow up 2 week[s].  All questions were answered. The patient knows to call the clinic with any problems, questions or concerns.  Timmy Forbes, MD, PhD [facility] Hematology Oncology 03/20/2024      HISTORY OF PRESENTING ILLNESS:  Oncology History  Rectal cancer [facility])  01/23/2019 Initial Diagnosis   Rectal cancer [facility]) Patient initially presented with complaints of postmenopausal bleeding on 08/16/2018.  History of was menopausal vaginal bleeding in 2016 which resulted in cervical polypectomy.  Pathology 02/04/2015 showed cervical polyp, consistent with benign endometrial polyp.  [name] after polypectomy due to anxiety associated with pelvic exams.  pelvic exam on 08/16/2018 reviewed cervical abnormality and from enlarged uterus. Seen by Dr. Denman Fischer on 10/29/2018.  Endometrial biopsy and a Pap smear was performed. 10/29/2018 Pap smear showed adenocarcinoma, favor endometrial origin. 10/29/2018 endometrial biopsy showed endometrioid carcinoma, FIGO grade 1.  10/29/2018- TA & TV Ultrasound revealed: Anteverted uterus measuring 8.7 x 5.6 x 6.4 cm without evidence of focal masses.  The endometrium measuring 24.1 mm (thickened) and  heterogeneous.  Right and left ovaries not visualized.  No adnexal masses identified.  No free fluid in cul-de-sac.  Patient was seen by Dr. Randalyn Bushman in clinic on 11/13/2018.  Cervical exam reveals 2 cm exophytic irregular mass consistent with malignancy.    11/19/2018 CT chest abdomen pelvis with contrast showed thickened endometrium with some irregularity compatible with the provided diagnosis of endometrial malignancy.  There is a mildly prominent left inguinal node 1.4 cm.  Patient was seen by Dr. Marella Shams on 11/20/2018 and left groin lymph node biopsy was recommended.  11/26/2018 patient underwent left inguinal lymph node biopsy. Pathology showed metastatic adenocarcinoma consistent with colorectal origin.  CDX 2+.  Case was discussed on tumor board.  Recommend colonoscopy for further evaluation.  Patient reports significant weight loss 30 pounds over the last year.  Chronic vaginal spotting. Change of bowel habits the past few months.  More constipated.  Family history positive for brother who has colon cancer prostate cancer.  patient has underwent colonoscopy on 12/03/2018 which reviewed a nonobstructing large mass in the rectum.  Also chronic fistula.  Mass was not circumferential.  This was biopsied with a cold forceps for histology.  Pathology came back hyperplastic polyp negative for dysplasia and malignancy. [name], patient underwent flex sigmoidoscopy on 12/06/2018 with rebiopsy of the rectal mass. This time biopsy results came back positive for invasive colorectal adenocarcinoma, moderately differentiated. Immunotherapy for nearly mismatch repair protein (MMR ) was performed.  There is no loss of MMR expression.  low probability of MSI high.   # Seen by [name] for evaluation of resectability for rectal cancer. In addition, she also had a second opinion with [facility] pathology where her endometrial biopsy pathology was changed to  adenocarcinoma,  consistent with colorectal primary.   [name]. She has home health that has been assisting with ostomy care  Patient was also evaluated by [facility] oncology.  Recommendation is to proceed with TNT with concurrent chemoradiation followed by neoadjuvant chemotherapy followed by surgical resection. Patient prefers to have treatment done locally with [facility].   # Oncology Treatment:  02/03/2019- 03/19/2019  concurrent Xeloda  and radiation.  Xeloda  dose 825mg  /m2 BID - rounded to 1650mg  BID- on days of radiation. 04/09/2019, started on FOLFOX with bolus early.  Omitted.  07/16/2019 finished 8 cycles of FOLFOX. 09/17/19 APR/posterior vaginectomy/TAH/BSO/VY-flap pT4b pN0 with close vaginal margin 0.2 mm.  Uterus and ovaries negative for malignancy.   10/06/2019 Cancer Staging   Staging form: Colon and Rectum, AJCC 8th Edition - Pathologic stage from 10/06/2019: Stage IIC (ypT4b, pN0, cM0) - Signed by Timmy Forbes, MD on 10/06/2019   06/30/2020 Cancer Staging   Staging form: Colon and Rectum, AJCC 8th Edition - Clinical stage from 06/30/2020: [name] - Signed by Timmy Forbes, MD on 04/17/2022 Stage prefix: Recurrence Total positive nodes: 1   06/30/2020 Relapse/Recurrence   vaginal introitus mass biopsied. Pathology is consistent with metastatic colorectal adenocarcinoma    07/20/2020 - 09/13/2020 Chemotherapy   Concurrent chemotherapy with Xeloda  with Radiation.  08/02/2020-08/16/2020, . Xeloda  was held due to neutropenia    08/17/2020 - 09/06/2020 Radiation Therapy   Xeloda  1500 mg twice daily concurrently with radiation    01/31/2021 Cancer Staging   Staging form: Colon and Rectum, AJCC 8th Edition - Pathologic: Stage Unknown (rpTX, pNX, cM1) - Signed by Timmy Forbes, MD on 01/31/2021 Stage prefix: Recurrence   01/31/2021 - 08/20/2021 Chemotherapy   FOLFIRI + Bev  Irinotecan   dose was reduced, eventually 100mg /m2  Irinotecan  was dropped in November 2022 due to side effects.  Negative for UGT1A1*28    09/13/2021 -  Chemotherapy   maintenance 5-FU/bevacizumab     11/28/2021 -  Chemotherapy   5-FU/Irinotecan /bevacizumab . Irinotecan  100 mg/m2 was added back due to progressively increasing CEA.    07/14/2022 Imaging   Bone scan  1. No evidence of skeletal metastatic disease in the hips. 2. Asymmetric increased activity in the RIGHT humeral head is favored degenerative as described above. If pain in the RIGHT shoulder consider further evaluation with plain film or MRI   07/17/2022 Imaging   1. Stable scattered small bilateral pulmonary nodules, largest 8 by 6 mm in the right middle lobe. 2. Subtle chronically stable posterior cortical irregularity and heterogeneity in the left iliac bone, not appreciable on the bone scan of 07/14/2022. This has been present at least over the past year and could reflect radiation therapy related findings although strictly speaking, malignant involvement of the left iliac bone is difficult to exclude. If clinically warranted this could be further assessed with nuclear medicine PET-CT or MRI of the bony pelvis with and without contrast. 3. Pelvic floor laxity with loops of small bowel extending 3 cm below the pubococcygeal line. 4. Aortic and systemic atherosclerosis as detailed above. Aortic Atherosclerosis    12/26/2022 Imaging   CT chest abdomen pelvis w contrast 1. Stable scattered solid pulmonary nodules. 2. Right inguinal and right pelvic lymph nodes which were hypermetabolic on prior PET-CT are stable. 3. Osseous lesion of the proximal right humerus demonstrates increased sclerosis when compared with the prior PET-CT, possibly due to treatment response. 4. 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 5. Aortic Atherosclerosis     06/25/2023 Imaging   CT chest abdomen pelvis w contrast showed 1. Increased size of the solid bilateral pulmonary nodules,consistent with worsening metastatic disease. 2. Increased size of the right  external iliac and inguinal lymph nodes, concerning for worsening metastatic disease. 3. No significant interval change in the left iliac bone osseous metastasis. 4. Enlarged main pulmonary artery measuring 3.6 cm, which can be seen in the setting of pulmonary arterial hypertension. 5.  Aortic Atherosclerosis (ICD10-I70.0)     Miscellaneous   Tempus liquid biopsy showed no actionable variants.  NOTCH1 missense variant, BRCA2 missense variant , MYC missense variant   07/11/2023 Imaging   PET showed 1. Multifocal tracer avid abdominopelvic lymph nodes are identified compatible with metastatic disease. 2. Multifocal bilateral pulmonary nodules are identified compatible with pulmonary metastasis.  3. Diffuse increased radiotracer uptake throughout the marrow space is identified diminishing sensitivity for detecting osseous metastasis. No abnormal increased radiotracer uptake identified above background increased bone marrow activity to indicate metabolically active bone metastases. 4.  Aortic Atherosclerosis   07/23/2023 - 11/13/2023 Chemotherapy   switch to dose reduced FOLFOX    09/16/2023 Imaging   CT abdomen pelvis w contrast  1. Small-bowel obstruction with transition point in the anterior aspect of the lower abdomen, left of midline. This may be secondary to adhesions. 2. Small volume ascites. 3. Prior colon resection with left anterior abdominal wall colostomy. Adjacent parastomal hernia. Small volume fluid within the hernia sac. 4. Stable pulmonary metastatic disease. 5. Stable metastatic lesion of the left iliac wing. 6. Enlarged right external iliac chain lymph node measuring 1.4 cm, previously 1.3 cm. Several mildly prominent retroperitoneal lymph nodes have increased in size from prior but remain subcentimeter in size. 7. Aortic atherosclerosis (ICD10-I70.0).   09/16/2023 - 09/20/2023  Hospital Admission   Hospitalized due to Small bowel obstruction due to adhesions     09/24/2023 Imaging   1. Bilateral pulmonary metastases are all stable from 06/25/2023 chest CT. 2. No thoracic adenopathy. No new or progressive metastatic disease in the chest. 3. Trace bilateral posterior pleural effusions. 4. Two-vessel coronary atherosclerosis. 5. Dilated main pulmonary artery, suggesting pulmonary arterial hypertension. 6.  Aortic Atherosclerosis (ICD10-I70.0).   11/28/2023 Imaging   CT chest abdomen pelvis with contrast 1. Increased size of retroperitoneal, iliac side chain and inguinal lymph nodes, concerning for progressive nodal metastatic disease. 2. Numerous bilateral solid pulmonary nodules are again seen, some of which have increased in size and others are stable in size overall compatible with disease progression. 3. Increased size of the subcapsular lesion in the anterior inferior right lobe of the liver, which has been stable over multiple prior examinations and was not FDG avid on prior PET-CT. Suggest attention on follow-up imaging. 4. 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 5. Questionable wall thickening of matted loops of small bowel in the left lower quadrant, suggest attention on follow-up imaging. 6. Small volume abdominopelvic free fluid. 7.  Aortic Atherosclerosis (ICD10-I70.0).   12/13/2023 -  Chemotherapy   Patient is on Treatment Plan : COLORECTAL Bevacizumab  + Trifluridine /Tipiracil  q28d     01/01/2024 Procedure   Right inguinal crease skin nodule biopsy  Pathology showed dermal deposits of metastatic colon adenocarcinoma, extending to the edge and base.      01/14/2024 Imaging   PET scan showed  1. Progressive disease with progressive left supraclavicular adenopathy, progressive pulmonary metastatic disease, progressive abdominal and pelvic adenopathy, and progressive multifocal skeletal involvement. 2. Substantial multifocal skeletal involvement including the right proximal humerus,  right medial clavicle, left T11 vertebral body, L4 posterior elements, and entire left iliac crest. The permeative lesion of the right humerus may have some extraosseous extension. Similarly there is cortical breakthrough in the large left iliac crest lesion. 3. Hypermetabolic irregular tumor extends along the right inguinal ring region and right hip adductor musculature as well as in the subcutaneous and cutaneous tissues along the right inguinal fold, compatible with tumor involvement. 4. Right eccentric vulvar activity just below the clitoris has a maximum SUV of 7.5, and given the regional soft tissue metastatic disease, a metastatic focus in this vicinity is not excluded. 5. The subcapsular lesion along the anterior inferior right hepatic lobe described on CT of 11/28/2023 is not discernibly hypermetabolic or visible on today's PET-CT. 6. Descending left colostomy with mild diverticulosis. 7.  Aortic Atherosclerosis (ICD10-I70.0).    Metastatic adenocarcinoma (HCC)  12/13/2023 -  Chemotherapy   Patient is on Treatment Plan : COLORECTAL Bevacizumab  + Trifluridine /Tipiracil  q28d         INTERVAL HISTORY Jacqueline Yoder is a 78 y.o. female who has above history reviewed by me presents for follow-up of rectal cancer. She was accompanied by her daughter. Appetite is not good + manageable neuropathy symptoms of her fingertips, intermittent,  worse when exposed to cold temperature.  + chronic loose ostomy output, stable, not worse.  + right groin skin mets, open wound, follows up with wound care.  She has not needed to take pain meds. Weight is stable. No fever or chills.   Review of Systems  Constitutional:  Positive for fatigue. Negative for appetite change, chills, fever and unexpected weight change.  HENT:   Negative for hearing loss and voice change.   Eyes:  Negative for  eye problems.  Respiratory:  Negative for chest tightness and cough.   Cardiovascular:  Negative for  chest pain.  Gastrointestinal:  Positive for diarrhea. Negative for abdominal distention, abdominal pain, blood in stool, constipation and nausea.  Endocrine: Negative for hot flashes.  Genitourinary:  Negative for difficulty urinating and frequency.   Musculoskeletal:  Positive for arthralgias.        right shoulder pain.   Skin:  Positive for rash. Negative for itching.  Neurological:  Positive for numbness. Negative for extremity weakness.  Hematological:  Negative for adenopathy.  Psychiatric/Behavioral:  Negative for confusion.     MEDICAL HISTORY:  Past Medical History:  Diagnosis Date   Allergy    Arthritis    Blood clot in vein    Family history of colon cancer    GERD (gastroesophageal reflux disease)    Hypercholesteremia    Hypertension    Hypertension    Lower extremity edema    Personal history of chemotherapy    Rectal cancer (HCC) 12/2018   Urinary incontinence     SURGICAL HISTORY: Past Surgical History:  Procedure Laterality Date   ABDOMINAL HYSTERECTOMY     CHOLECYSTECTOMY  1971   COLONOSCOPY WITH PROPOFOL  N/A 12/03/2018   Procedure: COLONOSCOPY WITH PROPOFOL ;  Surgeon: Marnee Sink, MD;  Location: [facility] ENDOSCOPY;  Service: Endoscopy;  Laterality: N/A;   COLONOSCOPY WITH PROPOFOL  N/A 07/15/2020   Procedure: COLONOSCOPY WITH PROPOFOL ;  Surgeon: Luke Salaam, MD;  Location: [facility] ENDOSCOPY;  Service: Gastroenterology;  Laterality: N/A;   FLEXIBLE SIGMOIDOSCOPY N/A 12/06/2018   Procedure: FLEXIBLE SIGMOIDOSCOPY;  Surgeon: Luke Salaam, MD;  Location: [facility] ENDOSCOPY;  Service: Endoscopy;  Laterality: N/A;   LAPAROSCOPIC COLOSTOMY  01/06/2019   PORTACATH PLACEMENT N/A 04/03/2019   Procedure: INSERTION PORT-A-CATH;  Surgeon: Alben Alma, MD;  Location: [facility] ORS;  Service: General;  Laterality: N/A;    SOCIAL HISTORY: Social History   Socioeconomic History   Marital status: Widowed    Spouse name: Not on file   Number of children: Not on file   Years of  education: Not on file   Highest education level: Some college, no degree  Occupational History   Not on file  Tobacco Use   Smoking status: Former    Current packs/day: 0.00    Types: Cigarettes    Quit date: 12/02/1977    Years since quitting: 46.3   Smokeless tobacco: Former  Building services engineer status: Never Used  Substance and Sexual Activity   Alcohol use: Never   Drug use: Never   Sexual activity: Not Currently    Birth control/protection: None  Other Topics Concern   Not on file  Social History Narrative   Lives with daughter   Social Drivers of Health   Financial Resource Strain: Low Risk  (11/15/2023)   Overall Financial Resource Strain (CARDIA)    Difficulty of Paying Living Expenses: Not hard at all  Food Insecurity: No Food Insecurity (11/15/2023)   Hunger Vital Sign    Worried About Running Out of Food in the Last Year: Never true    Ran Out of Food in the Last Year: Never true  Transportation Needs: No Transportation Needs (11/15/2023)   PRAPARE - Administrator, Civil Service (Medical): No    Lack of Transportation (Non-Medical): No  Physical Activity: Insufficiently Active (11/15/2023)   Exercise Vital Sign    Days of Exercise per Week: 3 days    Minutes of Exercise per Session: 10  min  Stress: No Stress Concern Present (11/15/2023)   [employer] of Occupational Health - Occupational Stress Questionnaire    Feeling of Stress : Not at all  Social Connections: Moderately Integrated (11/15/2023)   Social Connection and Isolation Panel [NHANES]    Frequency of Communication with Friends and Family: More than three times a week    Frequency of Social Gatherings with Friends and Family: More than three times a week    Attends Religious Services: More than 4 times per year    Active Member of [employer] or Organizations: Yes    Attends Banker Meetings: More than 4 times per year    Marital Status: Widowed  Intimate Partner Violence: Not  At Risk (09/27/2023)   Humiliation, Afraid, Rape, and Kick questionnaire    Fear of Current or Ex-Partner: No    Emotionally Abused: No    Physically Abused: No    Sexually Abused: No    FAMILY HISTORY: Family History  Problem Relation Age of Onset   Colon cancer Brother 31       exposure to chemicals [place]   Hypertension Mother    Stroke Mother    Kidney failure Father    Breast cancer Neg Hx    Ovarian cancer Neg Hx     ALLERGIES:  is allergic to sulfamethoxazole-trimethoprim.  MEDICATIONS:  Current Outpatient Medications  Medication Sig Dispense Refill   acetaminophen  (TYLENOL ) 650 MG CR tablet Take 650 mg by mouth every 8 (eight) hours as needed for pain.     apixaban  (ELIQUIS ) 2.5 MG TABS tablet Take 1 tablet (2.5 mg total) by mouth 2 (two) times daily. 200 tablet 2   Cholecalciferol  (VITAMIN D3) 2000 units capsule Take 2,000 Units by mouth daily.     diclofenac  sodium (VOLTAREN ) 1 % GEL Apply 2 g topically 4 (four) times daily as needed (joint pain).  11   diphenoxylate -atropine  (LOMOTIL ) 2.5-0.025 MG tablet Take 1 tablet by mouth 4 (four) times daily as needed for diarrhea or loose stools. 120 tablet 0   fluticasone  (FLONASE ) 50 MCG/ACT nasal spray Place 1 spray into both nostrils daily as needed for allergies or rhinitis.     lidocaine -prilocaine  (EMLA ) cream Apply 1 Application topically daily as needed. Apply small amount to port and cover with saran wrap 1-2 hours prior to port access 30 g 3   loratadine  (CLARITIN ) 10 MG tablet Take 10 mg by mouth daily.     magnesium  chloride (SLOW-MAG) 64 MG TBEC SR tablet Take 1 tablet (64 mg total) by mouth 2 (two) times daily. 60 tablet 2   Multiple Vitamins-Minerals (ONE-A-DAY WOMENS 50 PLUS PO) Take 1 tablet by mouth daily.      ondansetron  (ZOFRAN ) 8 MG tablet Take 1 tablet (8 mg total) by mouth every 8 (eight) hours as needed for nausea or vomiting. 30 tablet 1   potassium chloride  SA (KLOR-CON  M) 20 MEQ tablet TAKE 1 TABLET  BY MOUTH DAILY 100 tablet 1   prochlorperazine  (COMPAZINE ) 10 MG tablet Take 1 tablet (10 mg total) by mouth every 6 (six) hours as needed (NAUSEA). 30 tablet 1   simvastatin  (ZOCOR ) 40 MG tablet Take 1 tablet (40 mg total) by mouth at bedtime. 100 tablet 3   tiZANidine  (ZANAFLEX ) 4 MG tablet Take 0.5 tablets (2 mg total) by mouth every 8 (eight) hours as needed for muscle spasms. 10 tablet 0   triamterene -hydrochlorothiazide  (DYAZIDE ) 37.5-25 MG capsule Take 1 each (1 capsule total) by mouth daily. 90  capsule 1   trifluridine -tipiracil  (LONSURF ) 20-8.19 MG tablet Take 3 tablets (60 mg of trifluridine  total) by mouth 2 (two) times daily. 1hr after AM & PM meals days 1-5, 8-12. Repeat every 28d. 60 tablet 1   zinc gluconate 50 MG tablet Take 50 mg by mouth daily.     No current facility-administered medications for this visit.   Facility-Administered Medications Ordered in Other Visits  Medication Dose Route Frequency Provider Last Rate Last Admin   0.9 %  sodium chloride  infusion   Intravenous Continuous Timmy Forbes, MD   Stopped at 03/20/24 1044   heparin  lock flush 100 UNIT/ML injection            sodium chloride  flush (NS) 0.9 % injection 10 mL  10 mL Intracatheter PRN Timmy Forbes, MD   10 mL at 03/20/24 0945     PHYSICAL EXAMINATION: ECOG PERFORMANCE STATUS: 1 - Symptomatic but completely ambulatory  Physical Exam Constitutional:      General: She is not in acute distress. HENT:     Head: Normocephalic and atraumatic.  Eyes:     General: No scleral icterus. Cardiovascular:     Rate and Rhythm: Normal rate.     Heart sounds: Normal heart sounds.  Pulmonary:     Effort: Pulmonary effort is normal. No respiratory distress.     Breath sounds: No wheezing.  Abdominal:     General: Bowel sounds are normal. There is no distension.     Palpations: Abdomen is soft.     Comments: + Colostomy bag   Musculoskeletal:        General: No deformity. Normal range of motion.     Cervical back:  Normal range of motion and neck supple.     Right lower leg: Edema present.  Skin:    General: Skin is warm and dry.     Comments: Right groin skin nodules with open wound covered with dressing.   Neurological:     Mental Status: She is alert and oriented to person, place, and time. Mental status is at baseline.       LABORATORY DATA:  I have reviewed the data as listed    Latest Ref Rng & Units 03/20/2024    8:18 AM 03/06/2024    7:58 AM 02/21/2024    7:48 AM  CBC  WBC 4.0 - 10.5 K/uL 5.0  3.0  3.6   Hemoglobin 12.0 - 15.0 g/dL 40.9  81.1  91.4   Hematocrit 36.0 - 46.0 % 36.4  30.8  37.0   Platelets 150 - 400 K/uL 228  232  176       Latest Ref Rng & Units 03/20/2024    8:18 AM 03/06/2024    7:58 AM 02/21/2024    7:48 AM  CMP  Glucose 70 - 99 mg/dL 97  782  956   BUN 8 - 23 mg/dL 25  16  15    Creatinine 0.44 - 1.00 mg/dL 2.13  0.86  5.78   Sodium 135 - 145 mmol/L 138  136  139   Potassium 3.5 - 5.1 mmol/L 3.6  3.7  3.9   Chloride 98 - 111 mmol/L 102  100  104   CO2 22 - 32 mmol/L 26  27  26    Calcium  8.9 - 10.3 mg/dL 9.3  9.4  9.4   Total Protein 6.5 - 8.1 g/dL 7.4  7.2  7.2   Total Bilirubin 0.0 - 1.2 mg/dL 0.5  0.7  0.5  Alkaline Phos 38 - 126 U/L 77  70  75   AST 15 - 41 U/L 36  36  39   ALT 0 - 44 U/L 21  21  21       RADIOGRAPHIC STUDIES: I have personally reviewed the radiological images as listed and agreed with the findings in the report. MM 3D SCREENING MAMMOGRAM BILATERAL BREAST Result Date: 01/25/2024 CLINICAL DATA:  Screening. EXAM: DIGITAL SCREENING BILATERAL MAMMOGRAM WITH TOMOSYNTHESIS AND CAD TECHNIQUE: Bilateral screening digital craniocaudal and mediolateral oblique mammograms were obtained. Bilateral screening digital breast tomosynthesis was performed. The images were evaluated with computer-aided detection. COMPARISON:  Previous exam(s). ACR Breast Density Category b: There are scattered areas of fibroglandular density. FINDINGS: There are no findings  suspicious for malignancy. IMPRESSION: No mammographic evidence of malignancy. A result letter of this screening mammogram will be mailed directly to the patient. RECOMMENDATION: Screening mammogram in one year. (Code:[id number]-01Y) BI-RADS CATEGORY  1: Negative. Electronically Signed   By: Dina  Arceo M.D.   On: 01/25/2024 07:45   NM PET Image Restag (PS) Skull Base To Thigh Result Date: 01/17/2024 CLINICAL DATA:  Subsequent treatment strategy for rectal cancer. EXAM: NUCLEAR MEDICINE PET SKULL BASE TO THIGH TECHNIQUE: 8.5 mCi F-18 FDG was injected intravenously. Full-ring PET imaging was performed from the skull base to thigh after the radiotracer. CT data was obtained and used for attenuation correction and anatomic localization. Fasting blood glucose: 81 mg/dl COMPARISON:  16/07/9603, and CT scan of 11/28/2023 FINDINGS: Mediastinal blood pool activity: SUV max 2.1 Liver activity: SUV max NA NECK: No significant abnormal hypermetabolic activity in this region. Incidental CT findings: None. CHEST: Compared to prior PET-CT there is progressive left supraclavicular adenopathy measuring up to 1.2 cm in short axis with maximum SUV of 13.4 (formerly 4.9). Numerous scattered pulmonary nodules throughout both lungs, index lesion in the right middle lobe measuring 1.6 by 1.4 cm on image 68 series 6 with maximum SUV 11.5 (previously 4.3). Many of the nodules are new compared to the prior PET-CT, these are roughly similar in distribution to the CT chest from 11/28/2023. Incidental small lesion in the adipose tissues just above the left scapula, likely a lymph node, measuring 4 mm in short axis on image 35 series 6, maximum SUV 3.3. Incidental CT findings: Right Port-A-Cath tip: Right atrium. ABDOMEN/PELVIS: The subcapsular lesion along the anterior inferior right hepatic lobe described on CT of 11/28/2023 is not discernibly hypermetabolic or visible on today's PET-CT. Abnormal hypermetabolic retrocrural and retroperitoneal  adenopathy along with hypermetabolic right common iliac, bilateral external iliac, and bilateral inguinal adenopathy. Index left periaortic lymph node on image 95 series 6 measures 1.4 cm in short axis with maximum SUV 12.8. Similar size on CT from 11/28/2023, back on 07/09/2023 this node measured 0.7 cm with maximum SUV 4.2. Hypermetabolic irregular tumor extends along the right inguinal ring region and right hip adductor musculature as well as in the subcutaneous and cutaneous tissues along the right inguinal fold, compatible with tumor involvement. The multifocal cutaneous involvement has a maximum SUV of 10.1. Right eccentric vulvar activity just below the clitoris has a maximum SUV of 7.5, and given the regional soft tissue metastatic disease, a metastatic focus in this vicinity is not excluded. Incidental CT findings: Benign renal cysts. No further imaging workup of these lesions is indicated. Atherosclerosis is present, including aortoiliac atherosclerotic disease. Descending left colostomy noted with mild diverticulosis. SKELETON: Substantial multifocal skeletal involvement including the right proximal humerus, right medial clavicle, left T11 vertebral  body, L4 posterior elements, and entire left iliac crest. The permeative lesion of the right humerus may have some extraosseous extension, maximum SUV 16.0. Similarly there is cortical breakthrough in the large left iliac crest lesion which has a maximum SUV of 14.2. Incidental CT findings: None. IMPRESSION: 1. Progressive disease with progressive left supraclavicular adenopathy, progressive pulmonary metastatic disease, progressive abdominal and pelvic adenopathy, and progressive multifocal skeletal involvement. 2. Substantial multifocal skeletal involvement including the right proximal humerus, right medial clavicle, left T11 vertebral body, L4 posterior elements, and entire left iliac crest. The permeative lesion of the right humerus may have some  extraosseous extension. Similarly there is cortical breakthrough in the large left iliac crest lesion. 3. Hypermetabolic irregular tumor extends along the right inguinal ring region and right hip adductor musculature as well as in the subcutaneous and cutaneous tissues along the right inguinal fold, compatible with tumor involvement. 4. Right eccentric vulvar activity just below the clitoris has a maximum SUV of 7.5, and given the regional soft tissue metastatic disease, a metastatic focus in this vicinity is not excluded. 5. The subcapsular lesion along the anterior inferior right hepatic lobe described on CT of 11/28/2023 is not discernibly hypermetabolic or visible on today's PET-CT. 6. Descending left colostomy with mild diverticulosis. 7.  Aortic Atherosclerosis (ICD10-I70.0). Electronically Signed   By: Freida Jes M.D.   On: 01/17/2024 09:48   DG Shoulder Right Result Date: 12/27/2023 CLINICAL DATA:  Chronic right shoulder pain without known injury. EXAM: RIGHT SHOULDER - 2+ VIEW COMPARISON:  None Available. FINDINGS: Possible nondisplaced fracture involving greater tuberosity. No dislocation is noted. Moderate degenerative changes are seen involving the right glenohumeral and acromioclavicular joints. Possible Hill-Sachs deformity suggesting prior dislocations. IMPRESSION: Possible nondisplaced fracture involving greater tuberosity of proximal humerus. Degenerative changes as noted above. Electronically Signed   By: Rosalene Colon M.D.   On: 12/27/2023 09:51      MM 3D SCREENING MAMMOGRAM BILATERAL BREAST Result Date: 01/25/2024 CLINICAL DATA:  Screening. EXAM: DIGITAL SCREENING BILATERAL MAMMOGRAM WITH TOMOSYNTHESIS AND CAD TECHNIQUE: Bilateral screening digital craniocaudal and mediolateral oblique mammograms were obtained. Bilateral screening digital breast tomosynthesis was performed. The images were evaluated with computer-aided detection. COMPARISON:  Previous exam(s). ACR Breast  Density Category b: There are scattered areas of fibroglandular density. FINDINGS: There are no findings suspicious for malignancy. IMPRESSION: No mammographic evidence of malignancy. A result letter of this screening mammogram will be mailed directly to the patient. RECOMMENDATION: Screening mammogram in one year. (Code:[id number]) BI-RADS CATEGORY  1: Negative. Electronically Signed   By: Dina  Arceo M.D.   On: 01/25/2024 07:45   NM PET Image Restag (PS) Skull Base To Thigh Result Date: 01/17/2024 CLINICAL DATA:  Subsequent treatment strategy for rectal cancer. EXAM: NUCLEAR MEDICINE PET SKULL BASE TO THIGH TECHNIQUE: 8.5 mCi F-18 FDG was injected intravenously. Full-ring PET imaging was performed from the skull base to thigh after the radiotracer. CT data was obtained and used for attenuation correction and anatomic localization. Fasting blood glucose: 81 mg/dl COMPARISON:  46/96/2952, and CT scan of 11/28/2023 FINDINGS: Mediastinal blood pool activity: SUV max 2.1 Liver activity: SUV max NA NECK: No significant abnormal hypermetabolic activity in this region. Incidental CT findings: None. CHEST: Compared to prior PET-CT there is progressive left supraclavicular adenopathy measuring up to 1.2 cm in short axis with maximum SUV of 13.4 (formerly 4.9). Numerous scattered pulmonary nodules throughout both lungs, index lesion in the right middle lobe measuring 1.6 by 1.4 cm on image 68 series  6 with maximum SUV 11.5 (previously 4.3). Many of the nodules are new compared to the prior PET-CT, these are roughly similar in distribution to the CT chest from 11/28/2023. Incidental small lesion in the adipose tissues just above the left scapula, likely a lymph node, measuring 4 mm in short axis on image 35 series 6, maximum SUV 3.3. Incidental CT findings: Right Port-A-Cath tip: Right atrium. ABDOMEN/PELVIS: The subcapsular lesion along the anterior inferior right hepatic lobe described on CT of 11/28/2023 is not discernibly  hypermetabolic or visible on today's PET-CT. Abnormal hypermetabolic retrocrural and retroperitoneal adenopathy along with hypermetabolic right common iliac, bilateral external iliac, and bilateral inguinal adenopathy. Index left periaortic lymph node on image 95 series 6 measures 1.4 cm in short axis with maximum SUV 12.8. Similar size on CT from 11/28/2023, back on 07/09/2023 this node measured 0.7 cm with maximum SUV 4.2. Hypermetabolic irregular tumor extends along the right inguinal ring region and right hip adductor musculature as well as in the subcutaneous and cutaneous tissues along the right inguinal fold, compatible with tumor involvement. The multifocal cutaneous involvement has a maximum SUV of 10.1. Right eccentric vulvar activity just below the clitoris has a maximum SUV of 7.5, and given the regional soft tissue metastatic disease, a metastatic focus in this vicinity is not excluded. Incidental CT findings: Benign renal cysts. No further imaging workup of these lesions is indicated. Atherosclerosis is present, including aortoiliac atherosclerotic disease. Descending left colostomy noted with mild diverticulosis. SKELETON: Substantial multifocal skeletal involvement including the right proximal humerus, right medial clavicle, left T11 vertebral body, L4 posterior elements, and entire left iliac crest. The permeative lesion of the right humerus may have some extraosseous extension, maximum SUV 16.0. Similarly there is cortical breakthrough in the large left iliac crest lesion which has a maximum SUV of 14.2. Incidental CT findings: None. IMPRESSION: 1. Progressive disease with progressive left supraclavicular adenopathy, progressive pulmonary metastatic disease, progressive abdominal and pelvic adenopathy, and progressive multifocal skeletal involvement. 2. Substantial multifocal skeletal involvement including the right proximal humerus, right medial clavicle, left T11 vertebral body, L4 posterior  elements, and entire left iliac crest. The permeative lesion of the right humerus may have some extraosseous extension. Similarly there is cortical breakthrough in the large left iliac crest lesion. 3. Hypermetabolic irregular tumor extends along the right inguinal ring region and right hip adductor musculature as well as in the subcutaneous and cutaneous tissues along the right inguinal fold, compatible with tumor involvement. 4. Right eccentric vulvar activity just below the clitoris has a maximum SUV of 7.5, and given the regional soft tissue metastatic disease, a metastatic focus in this vicinity is not excluded. 5. The subcapsular lesion along the anterior inferior right hepatic lobe described on CT of 11/28/2023 is not discernibly hypermetabolic or visible on today's PET-CT. 6. Descending left colostomy with mild diverticulosis. 7.  Aortic Atherosclerosis (ICD10-I70.0). Electronically Signed   By: Freida Jes M.D.   On: 01/17/2024 09:48   DG Shoulder Right Result Date: 12/27/2023 CLINICAL DATA:  Chronic right shoulder pain without known injury. EXAM: RIGHT SHOULDER - 2+ VIEW COMPARISON:  None Available. FINDINGS: Possible nondisplaced fracture involving greater tuberosity. No dislocation is noted. Moderate degenerative changes are seen involving the right glenohumeral and acromioclavicular joints. Possible Hill-Sachs deformity suggesting prior dislocations. IMPRESSION: Possible nondisplaced fracture involving greater tuberosity of proximal humerus. Degenerative changes as noted above. Electronically Signed   By: Rosalene Colon M.D.   On: 12/27/2023 09:51

## 2024-03-20 NOTE — Assessment & Plan Note (Signed)
 Stable symptoms, Grade 1-2.patient copes well.

## 2024-03-21 ENCOUNTER — Encounter: Attending: Physician Assistant | Admitting: Physician Assistant

## 2024-03-21 DIAGNOSIS — L98492 Non-pressure chronic ulcer of skin of other sites with fat layer exposed: Secondary | ICD-10-CM | POA: Diagnosis not present

## 2024-03-21 DIAGNOSIS — C3412 Malignant neoplasm of upper lobe, left bronchus or lung: Secondary | ICD-10-CM | POA: Diagnosis not present

## 2024-03-21 DIAGNOSIS — C210 Malignant neoplasm of anus, unspecified: Secondary | ICD-10-CM | POA: Insufficient documentation

## 2024-03-21 DIAGNOSIS — C4499 Other specified malignant neoplasm of skin, unspecified: Secondary | ICD-10-CM | POA: Diagnosis not present

## 2024-03-21 DIAGNOSIS — Z85048 Personal history of other malignant neoplasm of rectum, rectosigmoid junction, and anus: Secondary | ICD-10-CM | POA: Insufficient documentation

## 2024-03-21 DIAGNOSIS — Z7901 Long term (current) use of anticoagulants: Secondary | ICD-10-CM | POA: Diagnosis not present

## 2024-03-21 DIAGNOSIS — Z86718 Personal history of other venous thrombosis and embolism: Secondary | ICD-10-CM | POA: Insufficient documentation

## 2024-03-21 LAB — CEA: CEA: 8045 ng/mL — ABNORMAL HIGH (ref 0.0–4.7)

## 2024-03-25 DIAGNOSIS — Z933 Colostomy status: Secondary | ICD-10-CM | POA: Diagnosis not present

## 2024-03-25 DIAGNOSIS — Z7901 Long term (current) use of anticoagulants: Secondary | ICD-10-CM | POA: Diagnosis not present

## 2024-04-03 ENCOUNTER — Encounter: Payer: Self-pay | Admitting: Oncology

## 2024-04-03 ENCOUNTER — Inpatient Hospital Stay

## 2024-04-03 ENCOUNTER — Inpatient Hospital Stay: Admitting: Oncology

## 2024-04-03 ENCOUNTER — Other Ambulatory Visit: Payer: Self-pay | Admitting: Oncology

## 2024-04-03 ENCOUNTER — Other Ambulatory Visit (HOSPITAL_COMMUNITY): Payer: Self-pay

## 2024-04-03 ENCOUNTER — Other Ambulatory Visit: Payer: Self-pay

## 2024-04-03 ENCOUNTER — Other Ambulatory Visit: Payer: Self-pay | Admitting: Family Medicine

## 2024-04-03 ENCOUNTER — Encounter (INDEPENDENT_AMBULATORY_CARE_PROVIDER_SITE_OTHER): Payer: Self-pay

## 2024-04-03 VITALS — BP 125/57 | HR 102 | Temp 97.8°F | Resp 15 | Wt 155.4 lb

## 2024-04-03 VITALS — HR 98

## 2024-04-03 DIAGNOSIS — Z87891 Personal history of nicotine dependence: Secondary | ICD-10-CM | POA: Diagnosis not present

## 2024-04-03 DIAGNOSIS — Z86711 Personal history of pulmonary embolism: Secondary | ICD-10-CM | POA: Diagnosis not present

## 2024-04-03 DIAGNOSIS — T451X5A Adverse effect of antineoplastic and immunosuppressive drugs, initial encounter: Secondary | ICD-10-CM

## 2024-04-03 DIAGNOSIS — C7951 Secondary malignant neoplasm of bone: Secondary | ICD-10-CM | POA: Diagnosis not present

## 2024-04-03 DIAGNOSIS — G620 Drug-induced polyneuropathy: Secondary | ICD-10-CM | POA: Diagnosis not present

## 2024-04-03 DIAGNOSIS — Z86718 Personal history of other venous thrombosis and embolism: Secondary | ICD-10-CM | POA: Diagnosis not present

## 2024-04-03 DIAGNOSIS — D6481 Anemia due to antineoplastic chemotherapy: Secondary | ICD-10-CM

## 2024-04-03 DIAGNOSIS — C799 Secondary malignant neoplasm of unspecified site: Secondary | ICD-10-CM

## 2024-04-03 DIAGNOSIS — I10 Essential (primary) hypertension: Secondary | ICD-10-CM | POA: Diagnosis not present

## 2024-04-03 DIAGNOSIS — D702 Other drug-induced agranulocytosis: Secondary | ICD-10-CM | POA: Diagnosis not present

## 2024-04-03 DIAGNOSIS — K219 Gastro-esophageal reflux disease without esophagitis: Secondary | ICD-10-CM | POA: Diagnosis not present

## 2024-04-03 DIAGNOSIS — E7800 Pure hypercholesterolemia, unspecified: Secondary | ICD-10-CM | POA: Diagnosis not present

## 2024-04-03 DIAGNOSIS — R197 Diarrhea, unspecified: Secondary | ICD-10-CM | POA: Diagnosis not present

## 2024-04-03 DIAGNOSIS — I700 Atherosclerosis of aorta: Secondary | ICD-10-CM | POA: Diagnosis not present

## 2024-04-03 DIAGNOSIS — C792 Secondary malignant neoplasm of skin: Secondary | ICD-10-CM | POA: Diagnosis not present

## 2024-04-03 DIAGNOSIS — C20 Malignant neoplasm of rectum: Secondary | ICD-10-CM

## 2024-04-03 DIAGNOSIS — Z5111 Encounter for antineoplastic chemotherapy: Secondary | ICD-10-CM

## 2024-04-03 DIAGNOSIS — K521 Toxic gastroenteritis and colitis: Secondary | ICD-10-CM | POA: Diagnosis not present

## 2024-04-03 DIAGNOSIS — Z923 Personal history of irradiation: Secondary | ICD-10-CM | POA: Diagnosis not present

## 2024-04-03 DIAGNOSIS — K56699 Other intestinal obstruction unspecified as to partial versus complete obstruction: Secondary | ICD-10-CM | POA: Diagnosis not present

## 2024-04-03 DIAGNOSIS — Z5189 Encounter for other specified aftercare: Secondary | ICD-10-CM | POA: Diagnosis not present

## 2024-04-03 DIAGNOSIS — Z9049 Acquired absence of other specified parts of digestive tract: Secondary | ICD-10-CM | POA: Diagnosis not present

## 2024-04-03 DIAGNOSIS — R188 Other ascites: Secondary | ICD-10-CM | POA: Diagnosis not present

## 2024-04-03 DIAGNOSIS — C7800 Secondary malignant neoplasm of unspecified lung: Secondary | ICD-10-CM | POA: Diagnosis not present

## 2024-04-03 DIAGNOSIS — R634 Abnormal weight loss: Secondary | ICD-10-CM | POA: Diagnosis not present

## 2024-04-03 LAB — CMP (CANCER CENTER ONLY)
ALT: 17 U/L (ref 0–44)
AST: 31 U/L (ref 15–41)
Albumin: 3.7 g/dL (ref 3.5–5.0)
Alkaline Phosphatase: 65 U/L (ref 38–126)
Anion gap: 8 (ref 5–15)
BUN: 17 mg/dL (ref 8–23)
CO2: 26 mmol/L (ref 22–32)
Calcium: 9.3 mg/dL (ref 8.9–10.3)
Chloride: 101 mmol/L (ref 98–111)
Creatinine: 0.61 mg/dL (ref 0.44–1.00)
GFR, Estimated: >60 mL/min (ref >60)
Glucose, Bld: 106 mg/dL — ABNORMAL HIGH (ref 70–99)
Potassium: 3.6 mmol/L (ref 3.5–5.1)
Sodium: 135 mmol/L (ref 135–145)
Total Bilirubin: 0.7 mg/dL (ref 0.0–1.2)
Total Protein: 6.8 g/dL (ref 6.5–8.1)

## 2024-04-03 LAB — CBC WITH DIFFERENTIAL (CANCER CENTER ONLY)
Abs Immature Granulocytes: 0.02 10*3/uL (ref 0.00–0.07)
Basophils Absolute: 0 10*3/uL (ref 0.0–0.1)
Basophils Relative: 0 %
Eosinophils Absolute: 0 10*3/uL (ref 0.0–0.5)
Eosinophils Relative: 1 %
HCT: 32.3 % — ABNORMAL LOW (ref 36.0–46.0)
Hemoglobin: 10.7 g/dL — ABNORMAL LOW (ref 12.0–15.0)
Immature Granulocytes: 1 %
Lymphocytes Relative: 19 %
Lymphs Abs: 0.8 10*3/uL (ref 0.7–4.0)
MCH: 33 pg (ref 26.0–34.0)
MCHC: 33.1 g/dL (ref 30.0–36.0)
MCV: 99.7 fL (ref 80.0–100.0)
Monocytes Absolute: 0.2 10*3/uL (ref 0.1–1.0)
Monocytes Relative: 5 %
Neutro Abs: 3 10*3/uL (ref 1.7–7.7)
Neutrophils Relative %: 74 %
Platelet Count: 170 10*3/uL (ref 150–400)
RBC: 3.24 MIL/uL — ABNORMAL LOW (ref 3.87–5.11)
RDW: 16.1 % — ABNORMAL HIGH (ref 11.5–15.5)
WBC Count: 4 10*3/uL (ref 4.0–10.5)
nRBC: 0 % (ref 0.0–0.2)

## 2024-04-03 MED ORDER — SODIUM CHLORIDE 0.9 % IV SOLN
INTRAVENOUS | Status: DC
  Filled 2024-04-03: qty 250

## 2024-04-03 MED ORDER — HEPARIN SOD (PORK) LOCK FLUSH 100 UNIT/ML IV SOLN
500.0000 [IU] | Freq: Once | INTRAVENOUS | Status: AC | PRN
  Administered 2024-04-03: 500 [IU]
  Filled 2024-04-03: qty 5

## 2024-04-03 MED ORDER — SODIUM CHLORIDE 0.9 % IV SOLN
5.0000 mg/kg | Freq: Once | INTRAVENOUS | Status: AC
  Administered 2024-04-03: 350 mg via INTRAVENOUS
  Filled 2024-04-03: qty 14

## 2024-04-03 MED ORDER — PEGFILGRASTIM INJECTION 6 MG/0.6ML ~~LOC~~
6.0000 mg | PREFILLED_SYRINGE | Freq: Once | SUBCUTANEOUS | Status: AC
  Administered 2024-04-03: 6 mg via SUBCUTANEOUS
  Filled 2024-04-03: qty 0.6

## 2024-04-03 NOTE — Assessment & Plan Note (Signed)
 S/p skin nodule biopsy confirmed metastatic adenocarcinoma consistent with colorectal origin.  No need for palliative RT per Radonc Follow up with wound care

## 2024-04-03 NOTE — Progress Notes (Signed)
 Nutrition Follow-up:  Patient with metastatic rectal cancer on palliative chemotherapy.  Patient taking lonsurf  and bevacizumab .    Met with patient and daughter during infusion.  Reports that her appetite has been decreased over the last several weeks.  Ate a chicken biscuit yesterday morning but nothing later in the evening per daughter.  Drinking some of ensure shakes.      Medications: reviewed  Labs: reviewed  Anthropometrics:   Weight 155 lb 6.4 oz   156 lb on 5/22 155 lb on 5/8 169 lb on 1/28 (edema) 163 lb on 12/31   NUTRITION DIAGNOSIS: Altered GI function improving    INTERVENTION:  Discussed appetite stimulant trial.  Will send message to provider. Patient requesting refill on nausea medication.  Will let provider know. Encouraged eating withing 1 hour of getting up and then q 1-2 hours    MONITORING, EVALUATION, GOAL: weight trends, intake   NEXT VISIT: Thursday, July 17 during infusion  Azarie Coriz B. Zollie Hipp, CSO, LDN Registered Dietitian [phone]

## 2024-04-03 NOTE — Assessment & Plan Note (Signed)
 Chemotherapy plan as listed above

## 2024-04-03 NOTE — Assessment & Plan Note (Signed)
 Prophylactic Nneulasta to D15 treatment of each cycle

## 2024-04-03 NOTE — Assessment & Plan Note (Signed)
 Stable symptoms, Grade 1-2.patient copes well.

## 2024-04-03 NOTE — Assessment & Plan Note (Addendum)
 continue lomotil  QID PRN as instructed.

## 2024-04-03 NOTE — Assessment & Plan Note (Addendum)
 Hb stable close monitor.

## 2024-04-03 NOTE — Progress Notes (Signed)
 Specialty Pharmacy Refill Coordination Note  MyChart Questionnaire Submission  Jacqueline Yoder is a 78 y.o. female contacted today regarding refills of specialty medication(s) Lonsurf .  Next cycle approx: 04/17/24.   Patient requested: (Patient-Rptd) Delivery   Delivery date: 04/08/24  Verified address: (Patient-Rptd) [address] [place], [place], [place] [address]  Medication will be filled on 04/07/24.  This fill date is pending response to refill request from provider. Patient is aware and if they have not received fill by intended date, they must follow up with pharmacy.

## 2024-04-03 NOTE — Assessment & Plan Note (Signed)
 History of stage IIIC Rectal cancer, s/p TNT, followed by 09/17/19 APR/posterior vaginectomy/TAH/BSO/VY-flap, pT4b pN0 with close vaginal margin 0.2 mm.  Uterus and ovaries negative for malignancy. palliative radiation to vaginal recurrence- 01/19/21 recurrence with lung metastasis.-Palliative -FOLFIRI plus bevacizumab .  Irinotecan  was dropped in November 2022 due to side effects. Negative for UGT1A1*28 - radiographically stable, rise of CEA-July 2023 CT lung metastasis worse--> Dec 2023 PET showed progression in pelvic lymph nodes and bone lesions,--> 2nd line irinotecan  +panitumumab -->CT March 2024 stable. --> June 2024 CT showed mild lung progression/Progressive left iliac bone metastasis, vulvar mass difficult for biopsy in office,  Tempus Liquid biopsy - no actionable variants --> 06/25/2023 CT showed progression of lung nodules [SLD increase 73mm] and iliac inguinal lymphadenopathy--> 07/23/23 switch to dose reduced FOLFOX --> Dec 2024 CT- stable disease. --> Feb 2025 disease progression--> longsurf + Bevacizumab   Labs are reviewed and discussed with patient   CEA nadir was in 490, progressively increases - 10,193  Currently on longsurf [60mg  BID day 1-5 and day 8- 12 every 28-day  + Bevacizumab  D1 proceed with D15 Bevacizumab  Proceed with cycle 4  D15 Longsuf and  Bevacizumab 

## 2024-04-03 NOTE — Assessment & Plan Note (Signed)
 History of PE and bilateral lower extremity DVT  Continue  Eliquis  2.5 mg twice daily for anticoagulation prophylaxis. Recommend leg elevation and compression stocking.

## 2024-04-03 NOTE — Patient Instructions (Signed)
 [facility] - A DEPT OF [facility]  Discharge Instructions: Thank you for choosing [facility] to provide your oncology and hematology care.  If you have a lab appointment with the Cancer Center, please go directly to the Cancer Center and check in at the registration area.  Wear comfortable clothing and clothing appropriate for easy access to any Portacath or PICC line.   We strive to give you quality time with your provider. You may need to reschedule your appointment if you arrive late (15 or more minutes).  Arriving late affects you and other patients whose appointments are after yours.  Also, if you miss three or more appointments without notifying the office, you may be dismissed from the clinic at the provider's discretion.      For prescription refill requests, have your pharmacy contact our office and allow 72 hours for refills to be completed.    Today you received the following chemotherapy and/or immunotherapy agents Bevicizumab      To help prevent nausea and vomiting after your treatment, we encourage you to take your nausea medication as directed.  BELOW ARE SYMPTOMS THAT SHOULD BE REPORTED IMMEDIATELY: *FEVER GREATER THAN 100.4 F (38 C) OR HIGHER *CHILLS OR SWEATING *NAUSEA AND VOMITING THAT IS NOT CONTROLLED WITH YOUR NAUSEA MEDICATION *UNUSUAL SHORTNESS OF BREATH *UNUSUAL BRUISING OR BLEEDING *URINARY PROBLEMS (pain or burning when urinating, or frequent urination) *BOWEL PROBLEMS (unusual diarrhea, constipation, pain near the anus) TENDERNESS IN MOUTH AND THROAT WITH OR WITHOUT PRESENCE OF ULCERS (sore throat, sores in mouth, or a toothache) UNUSUAL RASH, SWELLING OR PAIN  UNUSUAL VAGINAL DISCHARGE OR ITCHING   Items with * indicate a potential emergency and should be followed up as soon as possible or go to the Emergency Department if any problems should occur.  Please show the CHEMOTHERAPY ALERT CARD or IMMUNOTHERAPY  ALERT CARD at check-in to the Emergency Department and triage nurse.  Should you have questions after your visit or need to cancel or reschedule your appointment, please contact [facility] - A DEPT OF [facility]  [phone] and follow the prompts.  Office hours are 8:00 a.m. to 4:30 p.m. Monday - Friday. Please note that voicemails left after 4:00 p.m. may not be returned until the following business day.  We are closed weekends and major holidays. You have access to a nurse at all times for urgent questions. Please call the main number to the clinic [phone] and follow the prompts.  For any non-urgent questions, you may also contact your provider using MyChart. We now offer e-Visits for anyone 72 and older to request care online for non-urgent symptoms. For details visit [email or web address].   Also download the MyChart app! Go to the app store, search MyChart, open the app, select [facility], and log in with your MyChart username and password.

## 2024-04-03 NOTE — Progress Notes (Signed)
 Hematology/Oncology Progress note [phone] [phone]      CHIEF COMPLAINTS/REASON FOR VISIT:  Follow up for rectal cancer  ASSESSMENT & PLAN:   Cancer Staging  Rectal cancer [facility]) Staging form: Colon and Rectum, AJCC 8th Edition - Pathologic stage from 10/06/2019: Stage IIC (ypT4b, pN0, cM0) - Signed by Jacqueline Forbes, MD on 10/06/2019 - Clinical stage from 06/30/2020: [name] - Signed by Jacqueline Forbes, MD on 04/17/2022 - Pathologic: Stage Unknown (rpTX, pNX, cM1) - Signed by Jacqueline Forbes, MD on 01/31/2021   Rectal cancer [facility]) History of stage IIIC Rectal cancer, s/p TNT, followed by 09/17/19 APR/posterior vaginectomy/TAH/BSO/VY-flap, pT4b pN0 with close vaginal margin 0.2 mm.  Uterus and ovaries negative for malignancy. palliative radiation to vaginal recurrence- 01/19/21 recurrence with lung metastasis.-Palliative -FOLFIRI plus bevacizumab .  Irinotecan  was dropped in November 2022 due to side effects. Negative for UGT1A1*28 - radiographically stable, rise of CEA-July 2023 CT lung metastasis worse--> Dec 2023 PET showed progression in pelvic lymph nodes and bone lesions,--> 2nd line irinotecan  +panitumumab -->CT March 2024 stable. --> June 2024 CT showed mild lung progression/Progressive left iliac bone metastasis, vulvar mass difficult for biopsy in office,  Tempus Liquid biopsy - no actionable variants --> 06/25/2023 CT showed progression of lung nodules [SLD increase 44mm] and iliac inguinal lymphadenopathy--> 07/23/23 switch to dose reduced FOLFOX --> Dec 2024 CT- stable disease. --> Feb 2025 disease progression--> longsurf + Bevacizumab   Labs are reviewed and discussed with patient   CEA nadir was in 490, progressively increases - 10,193  Currently on longsurf [60mg  BID day 1-5 and day 8- 12 every 28-day  + Bevacizumab  D1 proceed with D15 Bevacizumab  Proceed with cycle 4  D15 Longsuf and  Bevacizumab   Chemotherapy-induced neuropathy (HCC) Stable symptoms, Grade 1-2.patient  copes well.   Chemotherapy induced diarrhea continue lomotil   QID PRN as instructed.  .   Anemia due to antineoplastic chemotherapy Hb stable close monitor.   Metastasis to skin [facility]) S/p skin nodule biopsy confirmed metastatic adenocarcinoma consistent with colorectal origin.  No need for palliative RT per Radonc Follow up with wound care  History of pulmonary embolism History of PE and bilateral lower extremity DVT  Continue  Eliquis  2.5 mg twice daily for anticoagulation prophylaxis. Recommend leg elevation and compression stocking.   Encounter for antineoplastic chemotherapy Chemotherapy plan as listed above.   Drug-induced neutropenia (HCC) Prophylactic Nneulasta to D15 treatment of each cycle  Follow up 2 week[s].  All questions were answered. The patient knows to call the clinic with any problems, questions or concerns.  Jacqueline Forbes, MD, PhD [facility] Hematology Oncology 04/03/2024      HISTORY OF PRESENTING ILLNESS:  Oncology History  Rectal cancer (HCC)  01/23/2019 Initial Diagnosis   Rectal cancer [facility]) Patient initially presented with complaints of postmenopausal bleeding on 08/16/2018.  History of was menopausal vaginal bleeding in 2016 which resulted in cervical polypectomy.  Pathology 02/04/2015 showed cervical polyp, consistent with benign endometrial polyp.  [name] after polypectomy due to anxiety associated with pelvic exams.  pelvic exam on 08/16/2018 reviewed cervical abnormality and from enlarged uterus. Seen by Dr. Denman Fischer on 10/29/2018.  Endometrial biopsy and a Pap smear was performed. 10/29/2018 Pap smear showed adenocarcinoma, favor endometrial origin. 10/29/2018 endometrial biopsy showed endometrioid carcinoma, FIGO grade 1.  10/29/2018- TA & TV Ultrasound revealed: Anteverted uterus measuring 8.7 x 5.6 x 6.4 cm without evidence of focal masses.  The endometrium measuring 24.1 mm (thickened) and heterogeneous.  Right and left ovaries not  visualized.  No adnexal masses identified.  No free fluid in cul-de-sac.  Patient was seen by Dr. Randalyn Bushman in clinic on 11/13/2018.  Cervical exam reveals 2 cm exophytic irregular mass consistent with malignancy.    11/19/2018 CT chest abdomen pelvis with contrast showed thickened endometrium with some irregularity compatible with the provided diagnosis of endometrial malignancy.  There is a mildly prominent left inguinal node 1.4 cm.  Patient was seen by Dr. Marella Shams on 11/20/2018 and left groin lymph node biopsy was recommended.  11/26/2018 patient underwent left inguinal lymph node biopsy. Pathology showed metastatic adenocarcinoma consistent with colorectal origin.  CDX 2+.  Case was discussed on tumor board.  Recommend colonoscopy for further evaluation.  Patient reports significant weight loss 30 pounds over the last year.  Chronic vaginal spotting. Change of bowel habits the past few months.  More constipated.  Family history positive for brother who has colon cancer prostate cancer.  patient has underwent colonoscopy on 12/03/2018 which reviewed a nonobstructing large mass in the rectum.  Also chronic fistula.  Mass was not circumferential.  This was biopsied with a cold forceps for histology.  Pathology came back hyperplastic polyp negative for dysplasia and malignancy. [name], patient underwent flex sigmoidoscopy on 12/06/2018 with rebiopsy of the rectal mass. This time biopsy results came back positive for invasive colorectal adenocarcinoma, moderately differentiated. Immunotherapy for nearly mismatch repair protein (MMR ) was performed.  There is no loss of MMR expression.  low probability of MSI high.   # Seen by [name] for evaluation of resectability for rectal cancer. In addition, she also had a second opinion with [facility] pathology where her endometrial biopsy pathology was changed to  adenocarcinoma, consistent with colorectal primary.   Patient  underwent diverge colostomy. She has home health that has been assisting with ostomy care  Patient was also evaluated by [facility] oncology.  Recommendation is to proceed with TNT with concurrent chemoradiation followed by neoadjuvant chemotherapy followed by surgical resection. Patient prefers to have treatment done locally with [facility].   # Oncology Treatment:  02/03/2019- 03/19/2019  concurrent Xeloda  and radiation.  Xeloda  dose 825mg  /m2 BID - rounded to 1650mg  BID- on days of radiation. 04/09/2019, started on FOLFOX with bolus early.  Omitted.  07/16/2019 finished 8 cycles of FOLFOX. 09/17/19 APR/posterior vaginectomy/TAH/BSO/VY-flap pT4b pN0 with close vaginal margin 0.2 mm.  Uterus and ovaries negative for malignancy.   10/06/2019 Cancer Staging   Staging form: Colon and Rectum, AJCC 8th Edition - Pathologic stage from 10/06/2019: Stage IIC (ypT4b, pN0, cM0) - Signed by Jacqueline Forbes, MD on 10/06/2019   06/30/2020 Cancer Staging   Staging form: Colon and Rectum, AJCC 8th Edition - Clinical stage from 06/30/2020: [name] - Signed by Jacqueline Forbes, MD on 04/17/2022 Stage prefix: Recurrence Total positive nodes: 1   06/30/2020 Relapse/Recurrence   vaginal introitus mass biopsied. Pathology is consistent with metastatic colorectal adenocarcinoma    07/20/2020 - 09/13/2020 Chemotherapy   Concurrent chemotherapy with Xeloda  with Radiation.  08/02/2020-08/16/2020, . Xeloda  was held due to neutropenia    08/17/2020 - 09/06/2020 Radiation Therapy   Xeloda  1500 mg twice daily concurrently with radiation    01/31/2021 Cancer Staging   Staging form: Colon and Rectum, AJCC 8th Edition - Pathologic: Stage Unknown (rpTX, pNX, cM1) - Signed by Jacqueline Forbes, MD on 01/31/2021 Stage prefix: Recurrence   01/31/2021 - 08/20/2021 Chemotherapy   FOLFIRI + Bev  Irinotecan  dose was reduced, eventually 100mg /m2  Irinotecan   was dropped in November 2022 due to side effects. Negative for UGT1A1*28    09/13/2021 -   Chemotherapy   maintenance 5-FU/bevacizumab     11/28/2021 -  Chemotherapy   5-FU/Irinotecan /bevacizumab . Irinotecan  100 mg/m2 was added back due to progressively increasing CEA.    07/14/2022 Imaging   Bone scan  1. No evidence of skeletal metastatic disease in the hips. 2. Asymmetric increased activity in the RIGHT humeral head is favored degenerative as described above. If pain in the RIGHT shoulder consider further evaluation with plain film or MRI   07/17/2022 Imaging   1. Stable scattered small bilateral pulmonary nodules, largest 8 by 6 mm in the right middle lobe. 2. Subtle chronically stable posterior cortical irregularity and heterogeneity in the left iliac bone, not appreciable on the bone scan of 07/14/2022. This has been present at least over the past year and could reflect radiation therapy related findings although strictly speaking, malignant involvement of the left iliac bone is difficult to exclude. If clinically warranted this could be further assessed with nuclear medicine PET-CT or MRI of the bony pelvis with and without contrast. 3. Pelvic floor laxity with loops of small bowel extending 3 cm below the pubococcygeal line. 4. Aortic and systemic atherosclerosis as detailed above. Aortic Atherosclerosis    12/26/2022 Imaging   CT chest abdomen pelvis w contrast 1. Stable scattered solid pulmonary nodules. 2. Right inguinal and right pelvic lymph nodes which were hypermetabolic on prior PET-CT are stable. 3. Osseous lesion of the proximal right humerus demonstrates increased sclerosis when compared with the prior PET-CT, possibly due to treatment response. 4. 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 5. Aortic Atherosclerosis     06/25/2023 Imaging   CT chest abdomen pelvis w contrast showed 1. Increased size of the solid bilateral pulmonary nodules,consistent with worsening metastatic disease. 2. Increased size of the right external iliac and inguinal lymph nodes,  concerning for worsening metastatic disease. 3. No significant interval change in the left iliac bone osseous metastasis. 4. Enlarged main pulmonary artery measuring 3.6 cm, which can be seen in the setting of pulmonary arterial hypertension. 5.  Aortic Atherosclerosis (ICD10-I70.0)     Miscellaneous   Tempus liquid biopsy showed no actionable variants.  NOTCH1 missense variant, BRCA2 missense variant , MYC missense variant   07/11/2023 Imaging   PET showed 1. Multifocal tracer avid abdominopelvic lymph nodes are identified compatible with metastatic disease. 2. Multifocal bilateral pulmonary nodules are identified compatible with pulmonary metastasis.  3. Diffuse increased radiotracer uptake throughout the marrow space is identified diminishing sensitivity for detecting osseous metastasis. No abnormal increased radiotracer uptake identified above background increased bone marrow activity to indicate metabolically active bone metastases. 4.  Aortic Atherosclerosis   07/23/2023 - 11/13/2023 Chemotherapy   switch to dose reduced FOLFOX    09/16/2023 Imaging   CT abdomen pelvis w contrast  1. Small-bowel obstruction with transition point in the anterior aspect of the lower abdomen, left of midline. This may be secondary to adhesions. 2. Small volume ascites. 3. Prior colon resection with left anterior abdominal wall colostomy. Adjacent parastomal hernia. Small volume fluid within the hernia sac. 4. Stable pulmonary metastatic disease. 5. Stable metastatic lesion of the left iliac wing. 6. Enlarged right external iliac chain lymph node measuring 1.4 cm, previously 1.3 cm. Several mildly prominent retroperitoneal lymph nodes have increased in size from prior but remain subcentimeter in size. 7. Aortic atherosclerosis (ICD10-I70.0).   09/16/2023 - 09/20/2023 Hospital Admission   Hospitalized due to  Small bowel obstruction due to adhesions    09/24/2023 Imaging   1. Bilateral pulmonary  metastases are all stable from 06/25/2023 chest CT. 2. No thoracic adenopathy. No new or progressive metastatic disease in the chest. 3. Trace bilateral posterior pleural effusions. 4. Two-vessel coronary atherosclerosis. 5. Dilated main pulmonary artery, suggesting pulmonary arterial hypertension. 6.  Aortic Atherosclerosis (ICD10-I70.0).   11/28/2023 Imaging   CT chest abdomen pelvis with contrast 1. Increased size of retroperitoneal, iliac side chain and inguinal lymph nodes, concerning for progressive nodal metastatic disease. 2. Numerous bilateral solid pulmonary nodules are again seen, some of which have increased in size and others are stable in size overall compatible with disease progression. 3. Increased size of the subcapsular lesion in the anterior inferior right lobe of the liver, which has been stable over multiple prior examinations and was not FDG avid on prior PET-CT. Suggest attention on follow-up imaging. 4. 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 5. Questionable wall thickening of matted loops of small bowel in the left lower quadrant, suggest attention on follow-up imaging. 6. Small volume abdominopelvic free fluid. 7.  Aortic Atherosclerosis (ICD10-I70.0).   12/13/2023 -  Chemotherapy   Patient is on Treatment Plan : COLORECTAL Bevacizumab  + Trifluridine /Tipiracil  q28d     01/01/2024 Procedure   Right inguinal crease skin nodule biopsy  Pathology showed dermal deposits of metastatic colon adenocarcinoma, extending to the edge and base.      01/14/2024 Imaging   PET scan showed  1. Progressive disease with progressive left supraclavicular adenopathy, progressive pulmonary metastatic disease, progressive abdominal and pelvic adenopathy, and progressive multifocal skeletal involvement. 2. Substantial multifocal skeletal involvement including the right proximal humerus, right medial clavicle, left T11 vertebral  body, L4 posterior elements, and entire left iliac crest. The permeative lesion of the right humerus may have some extraosseous extension. Similarly there is cortical breakthrough in the large left iliac crest lesion. 3. Hypermetabolic irregular tumor extends along the right inguinal ring region and right hip adductor musculature as well as in the subcutaneous and cutaneous tissues along the right inguinal fold, compatible with tumor involvement. 4. Right eccentric vulvar activity just below the clitoris has a maximum SUV of 7.5, and given the regional soft tissue metastatic disease, a metastatic focus in this vicinity is not excluded. 5. The subcapsular lesion along the anterior inferior right hepatic lobe described on CT of 11/28/2023 is not discernibly hypermetabolic or visible on today's PET-CT. 6. Descending left colostomy with mild diverticulosis. 7.  Aortic Atherosclerosis (ICD10-I70.0).    Metastatic adenocarcinoma (HCC)  12/13/2023 -  Chemotherapy   Patient is on Treatment Plan : COLORECTAL Bevacizumab  + Trifluridine /Tipiracil  q28d         INTERVAL HISTORY Jacqueline Yoder is a 78 y.o. female who has above history reviewed by me presents for follow-up of rectal cancer. She was accompanied by her daughter. Appetite is not good + manageable neuropathy symptoms of her fingertips, intermittent,  worse when exposed to cold temperature.  + chronic loose ostomy output, stable, not worse.  + right groin skin mets, open wound, follows up with wound care.  She has not needed to take pain meds. Weight is stable. No fever or chills.   Review of Systems  Constitutional:  Positive for fatigue. Negative for appetite change, chills, fever and unexpected weight change.  HENT:   Negative for hearing loss and voice change.   Eyes:  Negative for eye problems.  Respiratory:  Negative for  chest tightness and cough.   Cardiovascular:  Negative for chest pain.  Gastrointestinal:  Positive for  diarrhea. Negative for abdominal distention, abdominal pain, blood in stool, constipation and nausea.  Endocrine: Negative for hot flashes.  Genitourinary:  Negative for difficulty urinating and frequency.   Musculoskeletal:  Positive for arthralgias.        right shoulder pain.   Skin:  Positive for rash. Negative for itching.  Neurological:  Positive for numbness. Negative for extremity weakness.  Hematological:  Negative for adenopathy.  Psychiatric/Behavioral:  Negative for confusion.     MEDICAL HISTORY:  Past Medical History:  Diagnosis Date   Allergy    Arthritis    Blood clot in vein    Family history of colon cancer    GERD (gastroesophageal reflux disease)    Hypercholesteremia    Hypertension    Hypertension    Lower extremity edema    Personal history of chemotherapy    Rectal cancer (HCC) 12/2018   Urinary incontinence     SURGICAL HISTORY: Past Surgical History:  Procedure Laterality Date   ABDOMINAL HYSTERECTOMY     CHOLECYSTECTOMY  1971   COLONOSCOPY WITH PROPOFOL  N/A 12/03/2018   Procedure: COLONOSCOPY WITH PROPOFOL ;  Surgeon: Marnee Sink, MD;  Location: [facility] ENDOSCOPY;  Service: Endoscopy;  Laterality: N/A;   COLONOSCOPY WITH PROPOFOL  N/A 07/15/2020   Procedure: COLONOSCOPY WITH PROPOFOL ;  Surgeon: Luke Salaam, MD;  Location: [facility] ENDOSCOPY;  Service: Gastroenterology;  Laterality: N/A;   FLEXIBLE SIGMOIDOSCOPY N/A 12/06/2018   Procedure: FLEXIBLE SIGMOIDOSCOPY;  Surgeon: Luke Salaam, MD;  Location: [facility] ENDOSCOPY;  Service: Endoscopy;  Laterality: N/A;   LAPAROSCOPIC COLOSTOMY  01/06/2019   PORTACATH PLACEMENT N/A 04/03/2019   Procedure: INSERTION PORT-A-CATH;  Surgeon: Alben Alma, MD;  Location: [facility] ORS;  Service: General;  Laterality: N/A;    SOCIAL HISTORY: Social History   Socioeconomic History   Marital status: Widowed    Spouse name: Not on file   Number of children: Not on file   Years of education: Not on file   Highest education level:  Some college, no degree  Occupational History   Not on file  Tobacco Use   Smoking status: Former    Current packs/day: 0.00    Types: Cigarettes    Quit date: 12/02/1977    Years since quitting: 46.3   Smokeless tobacco: Former  Building services engineer status: Never Used  Substance and Sexual Activity   Alcohol use: Never   Drug use: Never   Sexual activity: Not Currently    Birth control/protection: None  Other Topics Concern   Not on file  Social History Narrative   Lives with daughter   Social Drivers of Health   Financial Resource Strain: Low Risk  (11/15/2023)   Overall Financial Resource Strain (CARDIA)    Difficulty of Paying Living Expenses: Not hard at all  Food Insecurity: No Food Insecurity (11/15/2023)   Hunger Vital Sign    Worried About Running Out of Food in the Last Year: Never true    Ran Out of Food in the Last Year: Never true  Transportation Needs: No Transportation Needs (11/15/2023)   PRAPARE - Administrator, Civil Service (Medical): No    Lack of Transportation (Non-Medical): No  Physical Activity: Insufficiently Active (11/15/2023)   Exercise Vital Sign    Days of Exercise per Week: 3 days    Minutes of Exercise per Session: 10 min  Stress: No Stress Concern Present (  11/15/2023)   [employer] [employer] - Occupational Stress Questionnaire    Feeling of Stress : Not at all  Social Connections: Moderately Integrated (11/15/2023)   Social Connection and Isolation Panel    Frequency of Communication with Friends and Family: More than three times a week    Frequency of Social Gatherings with Friends and Family: More than three times a week    Attends Religious Services: More than 4 times per year    Active Member of [employer] or Organizations: Yes    Attends Banker Meetings: More than 4 times per year    Marital Status: Widowed  Intimate Partner Violence: Not At Risk (09/27/2023)   Humiliation, Afraid, Rape, and Kick  questionnaire    Fear of Current or Ex-Partner: No    Emotionally Abused: No    Physically Abused: No    Sexually Abused: No    FAMILY HISTORY: Family History  Problem Relation Age of Onset   Colon cancer Brother 69       exposure to chemicals [place]   Hypertension Mother    Stroke Mother    Kidney failure Father    Breast cancer Neg Hx    Ovarian cancer Neg Hx     ALLERGIES:  is allergic to sulfamethoxazole-trimethoprim.  MEDICATIONS:  Current Outpatient Medications  Medication Sig Dispense Refill   acetaminophen  (TYLENOL ) 650 MG CR tablet Take 650 mg by mouth every 8 (eight) hours as needed for pain.     apixaban  (ELIQUIS ) 2.5 MG TABS tablet Take 1 tablet (2.5 mg total) by mouth 2 (two) times daily. 200 tablet 2   Cholecalciferol  (VITAMIN D3) 2000 units capsule Take 2,000 Units by mouth daily.     diclofenac  sodium (VOLTAREN ) 1 % GEL Apply 2 g topically 4 (four) times daily as needed (joint pain).  11   diphenoxylate -atropine  (LOMOTIL ) 2.5-0.025 MG tablet Take 1 tablet by mouth 4 (four) times daily as needed for diarrhea or loose stools. 120 tablet 0   fluticasone  (FLONASE ) 50 MCG/ACT nasal spray Place 1 spray into both nostrils daily as needed for allergies or rhinitis.     lidocaine -prilocaine  (EMLA ) cream Apply 1 Application topically daily as needed. Apply small amount to port and cover with saran wrap 1-2 hours prior to port access 30 g 3   loratadine  (CLARITIN ) 10 MG tablet Take 10 mg by mouth daily.     magnesium  chloride (SLOW-MAG) 64 MG TBEC SR tablet Take 1 tablet (64 mg total) by mouth 2 (two) times daily. 60 tablet 2   Multiple Vitamins-Minerals (ONE-A-DAY WOMENS 50 PLUS PO) Take 1 tablet by mouth daily.      ondansetron  (ZOFRAN ) 8 MG tablet Take 1 tablet (8 mg total) by mouth every 8 (eight) hours as needed for nausea or vomiting. 30 tablet 1   potassium chloride  SA (KLOR-CON  M) 20 MEQ tablet TAKE 1 TABLET BY MOUTH DAILY 100 tablet 1   prochlorperazine  (COMPAZINE )  10 MG tablet Take 1 tablet (10 mg total) by mouth every 6 (six) hours as needed (NAUSEA). 30 tablet 1   simvastatin  (ZOCOR ) 40 MG tablet Take 1 tablet (40 mg total) by mouth at bedtime. 100 tablet 3   tiZANidine  (ZANAFLEX ) 4 MG tablet Take 0.5 tablets (2 mg total) by mouth every 8 (eight) hours as needed for muscle spasms. 10 tablet 0   triamterene -hydrochlorothiazide  (DYAZIDE ) 37.5-25 MG capsule Take 1 each (1 capsule total) by mouth daily. 90 capsule 1   trifluridine -tipiracil  (LONSURF ) 20-8.19 MG  tablet Take 3 tablets (60 mg of trifluridine  total) by mouth 2 (two) times daily. 1hr after AM & PM meals days 1-5, 8-12. Repeat every 28d. 60 tablet 1   zinc gluconate 50 MG tablet Take 50 mg by mouth daily.     No current facility-administered medications for this visit.   Facility-Administered Medications Ordered in Other Visits  Medication Dose Route Frequency Provider Last Rate Last Admin   heparin  lock flush 100 UNIT/ML injection              PHYSICAL EXAMINATION: ECOG PERFORMANCE STATUS: 1 - Symptomatic but completely ambulatory  Physical Exam Constitutional:      General: She is not in acute distress. HENT:     Head: Normocephalic and atraumatic.   Eyes:     General: No scleral icterus.   Cardiovascular:     Rate and Rhythm: Normal rate.     Heart sounds: Normal heart sounds.  Pulmonary:     Effort: Pulmonary effort is normal. No respiratory distress.     Breath sounds: No wheezing.  Abdominal:     General: Bowel sounds are normal. There is no distension.     Palpations: Abdomen is soft.     Comments: + Colostomy bag    Musculoskeletal:        General: No deformity. Normal range of motion.     Cervical back: Normal range of motion and neck supple.     Right lower leg: Edema present.   Skin:    General: Skin is warm and dry.     Comments: Right groin skin nodules with open wound covered with dressing.    Neurological:     Mental Status: She is alert and oriented to  person, place, and time. Mental status is at baseline.       LABORATORY DATA:  I have reviewed the data as listed    Latest Ref Rng & Units 04/03/2024    8:27 AM 03/20/2024    8:18 AM 03/06/2024    7:58 AM  CBC  WBC 4.0 - 10.5 K/uL 4.0  5.0  3.0   Hemoglobin 12.0 - 15.0 g/dL 14.7  82.9  56.2   Hematocrit 36.0 - 46.0 % 32.3  36.4  30.8   Platelets 150 - 400 K/uL 170  228  232       Latest Ref Rng & Units 04/03/2024    8:27 AM 03/20/2024    8:18 AM 03/06/2024    7:58 AM  CMP  Glucose 70 - 99 mg/dL 130  97  865   BUN 8 - 23 mg/dL 17  25  16    Creatinine 0.44 - 1.00 mg/dL 7.84  6.96  2.95   Sodium 135 - 145 mmol/L 135  138  136   Potassium 3.5 - 5.1 mmol/L 3.6  3.6  3.7   Chloride 98 - 111 mmol/L 101  102  100   CO2 22 - 32 mmol/L 26  26  27    Calcium  8.9 - 10.3 mg/dL 9.3  9.3  9.4   Total Protein 6.5 - 8.1 g/dL 6.8  7.4  7.2   Total Bilirubin 0.0 - 1.2 mg/dL 0.7  0.5  0.7   Alkaline Phos 38 - 126 U/L 65  77  70   AST 15 - 41 U/L 31  36  36   ALT 0 - 44 U/L 17  21  21       RADIOGRAPHIC STUDIES: I have personally reviewed the radiological  images as listed and agreed with the findings in the report. MM 3D SCREENING MAMMOGRAM BILATERAL BREAST Result Date: 01/25/2024 CLINICAL DATA:  Screening. EXAM: DIGITAL SCREENING BILATERAL MAMMOGRAM WITH TOMOSYNTHESIS AND CAD TECHNIQUE: Bilateral screening digital craniocaudal and mediolateral oblique mammograms were obtained. Bilateral screening digital breast tomosynthesis was performed. The images were evaluated with computer-aided detection. COMPARISON:  Previous exam(s). ACR Breast Density Category b: There are scattered areas of fibroglandular density. FINDINGS: There are no findings suspicious for malignancy. IMPRESSION: No mammographic evidence of malignancy. A result letter of this screening mammogram will be mailed directly to the patient. RECOMMENDATION: Screening mammogram in one year. (Code:[id number]-B-01Y) BI-RADS CATEGORY  1: Negative.  Electronically Signed   By: Dina  Arceo M.D.   On: 01/25/2024 07:45   NM PET Image Restag (PS) Skull Base To Thigh Result Date: 01/17/2024 CLINICAL DATA:  Subsequent treatment strategy for rectal cancer. EXAM: NUCLEAR MEDICINE PET SKULL BASE TO THIGH TECHNIQUE: 8.5 mCi F-18 FDG was injected intravenously. Full-ring PET imaging was performed from the skull base to thigh after the radiotracer. CT data was obtained and used for attenuation correction and anatomic localization. Fasting blood glucose: 81 mg/dl COMPARISON:  16/07/9603, and CT scan of 11/28/2023 FINDINGS: Mediastinal blood pool activity: SUV max 2.1 Liver activity: SUV max NA NECK: No significant abnormal hypermetabolic activity in this region. Incidental CT findings: None. CHEST: Compared to prior PET-CT there is progressive left supraclavicular adenopathy measuring up to 1.2 cm in short axis with maximum SUV of 13.4 (formerly 4.9). Numerous scattered pulmonary nodules throughout both lungs, index lesion in the right middle lobe measuring 1.6 by 1.4 cm on image 68 series 6 with maximum SUV 11.5 (previously 4.3). Many of the nodules are new compared to the prior PET-CT, these are roughly similar in distribution to the CT chest from 11/28/2023. Incidental small lesion in the adipose tissues just above the left scapula, likely a lymph node, measuring 4 mm in short axis on image 35 series 6, maximum SUV 3.3. Incidental CT findings: Right Port-A-Cath tip: Right atrium. ABDOMEN/PELVIS: The subcapsular lesion along the anterior inferior right hepatic lobe described on CT of 11/28/2023 is not discernibly hypermetabolic or visible on today's PET-CT. Abnormal hypermetabolic retrocrural and retroperitoneal adenopathy along with hypermetabolic right common iliac, bilateral external iliac, and bilateral inguinal adenopathy. Index left periaortic lymph node on image 95 series 6 measures 1.4 cm in short axis with maximum SUV 12.8. Similar size on CT from 11/28/2023,  back on 07/09/2023 this node measured 0.7 cm with maximum SUV 4.2. Hypermetabolic irregular tumor extends along the right inguinal ring region and right hip adductor musculature as well as in the subcutaneous and cutaneous tissues along the right inguinal fold, compatible with tumor involvement. The multifocal cutaneous involvement has a maximum SUV of 10.1. Right eccentric vulvar activity just below the clitoris has a maximum SUV of 7.5, and given the regional soft tissue metastatic disease, a metastatic focus in this vicinity is not excluded. Incidental CT findings: Benign renal cysts. No further imaging workup of these lesions is indicated. Atherosclerosis is present, including aortoiliac atherosclerotic disease. Descending left colostomy noted with mild diverticulosis. SKELETON: Substantial multifocal skeletal involvement including the right proximal humerus, right medial clavicle, left T11 vertebral body, L4 posterior elements, and entire left iliac crest. The permeative lesion of the right humerus may have some extraosseous extension, maximum SUV 16.0. Similarly there is cortical breakthrough in the large left iliac crest lesion which has a maximum SUV of 14.2. Incidental CT findings: None. IMPRESSION:  1. Progressive disease with progressive left supraclavicular adenopathy, progressive pulmonary metastatic disease, progressive abdominal and pelvic adenopathy, and progressive multifocal skeletal involvement. 2. Substantial multifocal skeletal involvement including the right proximal humerus, right medial clavicle, left T11 vertebral body, L4 posterior elements, and entire left iliac crest. The permeative lesion of the right humerus may have some extraosseous extension. Similarly there is cortical breakthrough in the large left iliac crest lesion. 3. Hypermetabolic irregular tumor extends along the right inguinal ring region and right hip adductor musculature as well as in the subcutaneous and cutaneous tissues  along the right inguinal fold, compatible with tumor involvement. 4. Right eccentric vulvar activity just below the clitoris has a maximum SUV of 7.5, and given the regional soft tissue metastatic disease, a metastatic focus in this vicinity is not excluded. 5. The subcapsular lesion along the anterior inferior right hepatic lobe described on CT of 11/28/2023 is not discernibly hypermetabolic or visible on today's PET-CT. 6. Descending left colostomy with mild diverticulosis. 7.  Aortic Atherosclerosis (ICD10-I70.0). Electronically Signed   By: Freida Jes M.D.   On: 01/17/2024 09:48      MM 3D SCREENING MAMMOGRAM BILATERAL BREAST Result Date: 01/25/2024 CLINICAL DATA:  Screening. EXAM: DIGITAL SCREENING BILATERAL MAMMOGRAM WITH TOMOSYNTHESIS AND CAD TECHNIQUE: Bilateral screening digital craniocaudal and mediolateral oblique mammograms were obtained. Bilateral screening digital breast tomosynthesis was performed. The images were evaluated with computer-aided detection. COMPARISON:  Previous exam(s). ACR Breast Density Category b: There are scattered areas of fibroglandular density. FINDINGS: There are no findings suspicious for malignancy. IMPRESSION: No mammographic evidence of malignancy. A result letter of this screening mammogram will be mailed directly to the patient. RECOMMENDATION: Screening mammogram in one year. (Code:[id number]) BI-RADS CATEGORY  1: Negative. Electronically Signed   By: Dina  Arceo M.D.   On: 01/25/2024 07:45   NM PET Image Restag (PS) Skull Base To Thigh Result Date: 01/17/2024 CLINICAL DATA:  Subsequent treatment strategy for rectal cancer. EXAM: NUCLEAR MEDICINE PET SKULL BASE TO THIGH TECHNIQUE: 8.5 mCi F-18 FDG was injected intravenously. Full-ring PET imaging was performed from the skull base to thigh after the radiotracer. CT data was obtained and used for attenuation correction and anatomic localization. Fasting blood glucose: 81 mg/dl COMPARISON:  96/01/5408, and CT  scan of 11/28/2023 FINDINGS: Mediastinal blood pool activity: SUV max 2.1 Liver activity: SUV max NA NECK: No significant abnormal hypermetabolic activity in this region. Incidental CT findings: None. CHEST: Compared to prior PET-CT there is progressive left supraclavicular adenopathy measuring up to 1.2 cm in short axis with maximum SUV of 13.4 (formerly 4.9). Numerous scattered pulmonary nodules throughout both lungs, index lesion in the right middle lobe measuring 1.6 by 1.4 cm on image 68 series 6 with maximum SUV 11.5 (previously 4.3). Many of the nodules are new compared to the prior PET-CT, these are roughly similar in distribution to the CT chest from 11/28/2023. Incidental small lesion in the adipose tissues just above the left scapula, likely a lymph node, measuring 4 mm in short axis on image 35 series 6, maximum SUV 3.3. Incidental CT findings: Right Port-A-Cath tip: Right atrium. ABDOMEN/PELVIS: The subcapsular lesion along the anterior inferior right hepatic lobe described on CT of 11/28/2023 is not discernibly hypermetabolic or visible on today's PET-CT. Abnormal hypermetabolic retrocrural and retroperitoneal adenopathy along with hypermetabolic right common iliac, bilateral external iliac, and bilateral inguinal adenopathy. Index left periaortic lymph node on image 95 series 6 measures 1.4 cm in short axis with maximum SUV 12.8. Similar size on  CT from 11/28/2023, back on 07/09/2023 this node measured 0.7 cm with maximum SUV 4.2. Hypermetabolic irregular tumor extends along the right inguinal ring region and right hip adductor musculature as well as in the subcutaneous and cutaneous tissues along the right inguinal fold, compatible with tumor involvement. The multifocal cutaneous involvement has a maximum SUV of 10.1. Right eccentric vulvar activity just below the clitoris has a maximum SUV of 7.5, and given the regional soft tissue metastatic disease, a metastatic focus in this vicinity is not  excluded. Incidental CT findings: Benign renal cysts. No further imaging workup of these lesions is indicated. Atherosclerosis is present, including aortoiliac atherosclerotic disease. Descending left colostomy noted with mild diverticulosis. SKELETON: Substantial multifocal skeletal involvement including the right proximal humerus, right medial clavicle, left T11 vertebral body, L4 posterior elements, and entire left iliac crest. The permeative lesion of the right humerus may have some extraosseous extension, maximum SUV 16.0. Similarly there is cortical breakthrough in the large left iliac crest lesion which has a maximum SUV of 14.2. Incidental CT findings: None. IMPRESSION: 1. Progressive disease with progressive left supraclavicular adenopathy, progressive pulmonary metastatic disease, progressive abdominal and pelvic adenopathy, and progressive multifocal skeletal involvement. 2. Substantial multifocal skeletal involvement including the right proximal humerus, right medial clavicle, left T11 vertebral body, L4 posterior elements, and entire left iliac crest. The permeative lesion of the right humerus may have some extraosseous extension. Similarly there is cortical breakthrough in the large left iliac crest lesion. 3. Hypermetabolic irregular tumor extends along the right inguinal ring region and right hip adductor musculature as well as in the subcutaneous and cutaneous tissues along the right inguinal fold, compatible with tumor involvement. 4. Right eccentric vulvar activity just below the clitoris has a maximum SUV of 7.5, and given the regional soft tissue metastatic disease, a metastatic focus in this vicinity is not excluded. 5. The subcapsular lesion along the anterior inferior right hepatic lobe described on CT of 11/28/2023 is not discernibly hypermetabolic or visible on today's PET-CT. 6. Descending left colostomy with mild diverticulosis. 7.  Aortic Atherosclerosis (ICD10-I70.0). Electronically  Signed   By: Freida Jes M.D.   On: 01/17/2024 09:48

## 2024-04-04 ENCOUNTER — Other Ambulatory Visit: Payer: Self-pay

## 2024-04-04 ENCOUNTER — Other Ambulatory Visit: Payer: Self-pay | Admitting: Oncology

## 2024-04-04 MED ORDER — ONDANSETRON HCL 8 MG PO TABS: 8.0000 mg | ORAL_TABLET | Freq: Three times a day (TID) | ORAL | 1 refills | Status: DC | PRN

## 2024-04-04 MED ORDER — LONSURF 20-8.19 MG PO TABS
35.0000 mg/m2 | ORAL_TABLET | Freq: Two times a day (BID) | ORAL | 1 refills | Status: DC
  Filled 2024-04-04: qty 60, 28d supply, fill #0
  Filled 2024-05-08: qty 60, 28d supply, fill #1

## 2024-04-06 ENCOUNTER — Other Ambulatory Visit: Payer: Self-pay | Admitting: Oncology

## 2024-04-06 MED ORDER — MEGESTROL ACETATE 40 MG PO TABS: 40.0000 mg | ORAL_TABLET | Freq: Two times a day (BID) | ORAL | 1 refills | Status: DC

## 2024-04-07 ENCOUNTER — Other Ambulatory Visit (HOSPITAL_COMMUNITY): Payer: Self-pay

## 2024-04-07 ENCOUNTER — Telehealth: Payer: Self-pay

## 2024-04-07 NOTE — Telephone Encounter (Signed)
-----   Message from Zelphia Cap sent at 04/06/2024  4:05 PM EDT ----- Megace Rx was sent. Please advise pt to pick up and try ----- Message ----- From: Dasie Simple, RD Sent: 04/03/2024  11:21 AM EDT To: Almarie JINNY Nett, RN; Zelphia Cap, MD  Dr Cap,  Ms [facility] tells me that her appetite is decreased.  She is interested in trial of appetite stimulant to see if it would help.  Daughter is concerned about her appetite.  Ms Heskett is also requesting refill on her nausea medications.    Thanks, Joli

## 2024-04-07 NOTE — Telephone Encounter (Signed)
 Off protocol- no longer on current medication list Requested Prescriptions  Pending Prescriptions Disp Refills   ipratropium (ATROVENT ) 0.03 % nasal spray [Pharmacy Med Name: Ipratropium Bromide  0.03 % Nasal Solution] 60 mL 2    Sig: USE 1 SPRAY IN BOTH NOSTRILS  TWICE DAILY     Off-Protocol Failed - 04/07/2024  2:37 PM      Failed - Medication not assigned to a protocol, review manually.      Passed - Valid encounter within last 12 months    Recent Outpatient Visits           3 months ago Metastatic adenocarcinoma [facility]   [place] [facility] [place], Marsa PARAS, DO   3 months ago Chronic right shoulder pain   [place] [facility] [place], [place] W, NP   4 months ago Benign hypertension with CKD (chronic kidney disease), stage II   [place] [facility] [place] Marsa PARAS, DO   4 months ago Chronic right shoulder pain   [place] [facility] [place] Angeline ORN, NP             Off-Protocol Failed - 04/07/2024  2:37 PM      Failed - Medication not assigned to a protocol, review manually.      Passed - Valid encounter within last 12 months    Recent Outpatient Visits           3 months ago Metastatic adenocarcinoma [facility])   [place] [facility] [place], Marsa PARAS, DO   3 months ago Chronic right shoulder pain   [place] [facility] [place], [place] W, NP   4 months ago Benign hypertension with CKD (chronic kidney disease), stage II   [facility] [place], Marsa PARAS, DO   4 months ago Chronic right shoulder pain    [facility] [place], Angeline ORN, [place]

## 2024-04-08 ENCOUNTER — Encounter: Admitting: Physician Assistant

## 2024-04-08 DIAGNOSIS — Z85048 Personal history of other malignant neoplasm of rectum, rectosigmoid junction, and anus: Secondary | ICD-10-CM | POA: Diagnosis not present

## 2024-04-08 DIAGNOSIS — Z7901 Long term (current) use of anticoagulants: Secondary | ICD-10-CM | POA: Diagnosis not present

## 2024-04-08 DIAGNOSIS — C4499 Other specified malignant neoplasm of skin, unspecified: Secondary | ICD-10-CM | POA: Diagnosis not present

## 2024-04-08 DIAGNOSIS — C210 Malignant neoplasm of anus, unspecified: Secondary | ICD-10-CM | POA: Diagnosis not present

## 2024-04-08 DIAGNOSIS — C3412 Malignant neoplasm of upper lobe, left bronchus or lung: Secondary | ICD-10-CM | POA: Diagnosis not present

## 2024-04-08 DIAGNOSIS — L98492 Non-pressure chronic ulcer of skin of other sites with fat layer exposed: Secondary | ICD-10-CM | POA: Diagnosis not present

## 2024-04-08 DIAGNOSIS — Z86718 Personal history of other venous thrombosis and embolism: Secondary | ICD-10-CM | POA: Diagnosis not present

## 2024-04-09 DIAGNOSIS — Z7901 Long term (current) use of anticoagulants: Secondary | ICD-10-CM | POA: Diagnosis not present

## 2024-04-09 DIAGNOSIS — C20 Malignant neoplasm of rectum: Secondary | ICD-10-CM | POA: Diagnosis not present

## 2024-04-10 ENCOUNTER — Ambulatory Visit
Admission: RE | Admit: 2024-04-10 | Discharge: 2024-04-10 | Disposition: A | Discharge status: Home / Self Care | Source: Ambulatory Visit | Attending: Oncology | Admitting: Oncology

## 2024-04-10 ENCOUNTER — Ambulatory Visit

## 2024-04-10 DIAGNOSIS — C20 Malignant neoplasm of rectum: Secondary | ICD-10-CM | POA: Insufficient documentation

## 2024-04-10 DIAGNOSIS — R590 Localized enlarged lymph nodes: Secondary | ICD-10-CM | POA: Diagnosis not present

## 2024-04-10 DIAGNOSIS — C7951 Secondary malignant neoplasm of bone: Secondary | ICD-10-CM | POA: Diagnosis not present

## 2024-04-10 DIAGNOSIS — C801 Malignant (primary) neoplasm, unspecified: Secondary | ICD-10-CM | POA: Diagnosis not present

## 2024-04-10 MED ORDER — IOHEXOL 300 MG/ML  SOLN
85.0000 mL | Freq: Once | INTRAMUSCULAR | Status: AC | PRN
  Administered 2024-04-10: 85 mL via INTRAVENOUS

## 2024-04-10 MED ORDER — BARIUM SULFATE 2 % PO SUSP
450.0000 mL | ORAL | Status: AC
  Administered 2024-04-10 (×2): 450 mL via ORAL

## 2024-04-11 ENCOUNTER — Ambulatory Visit: Admitting: Physician Assistant

## 2024-04-15 ENCOUNTER — Other Ambulatory Visit (HOSPITAL_COMMUNITY): Payer: Self-pay

## 2024-04-16 ENCOUNTER — Encounter: Attending: Physician Assistant | Admitting: Physician Assistant

## 2024-04-16 DIAGNOSIS — Z86718 Personal history of other venous thrombosis and embolism: Secondary | ICD-10-CM | POA: Insufficient documentation

## 2024-04-16 DIAGNOSIS — Z85048 Personal history of other malignant neoplasm of rectum, rectosigmoid junction, and anus: Secondary | ICD-10-CM | POA: Insufficient documentation

## 2024-04-16 DIAGNOSIS — L98492 Non-pressure chronic ulcer of skin of other sites with fat layer exposed: Secondary | ICD-10-CM | POA: Insufficient documentation

## 2024-04-16 DIAGNOSIS — C3412 Malignant neoplasm of upper lobe, left bronchus or lung: Secondary | ICD-10-CM | POA: Insufficient documentation

## 2024-04-16 DIAGNOSIS — Z933 Colostomy status: Secondary | ICD-10-CM | POA: Diagnosis not present

## 2024-04-16 DIAGNOSIS — Z7901 Long term (current) use of anticoagulants: Secondary | ICD-10-CM | POA: Diagnosis not present

## 2024-04-16 DIAGNOSIS — C4499 Other specified malignant neoplasm of skin, unspecified: Secondary | ICD-10-CM | POA: Diagnosis not present

## 2024-04-17 ENCOUNTER — Encounter: Payer: Self-pay | Admitting: Oncology

## 2024-04-17 ENCOUNTER — Inpatient Hospital Stay (HOSPITAL_BASED_OUTPATIENT_CLINIC_OR_DEPARTMENT_OTHER): Admitting: Oncology

## 2024-04-17 ENCOUNTER — Inpatient Hospital Stay: Attending: Oncology

## 2024-04-17 ENCOUNTER — Inpatient Hospital Stay

## 2024-04-17 VITALS — BP 134/60 | HR 80

## 2024-04-17 VITALS — BP 131/68 | HR 94 | Temp 98.3°F | Resp 18 | Wt 156.3 lb

## 2024-04-17 DIAGNOSIS — D702 Other drug-induced agranulocytosis: Secondary | ICD-10-CM | POA: Insufficient documentation

## 2024-04-17 DIAGNOSIS — Z7901 Long term (current) use of anticoagulants: Secondary | ICD-10-CM | POA: Insufficient documentation

## 2024-04-17 DIAGNOSIS — C20 Malignant neoplasm of rectum: Secondary | ICD-10-CM

## 2024-04-17 DIAGNOSIS — C7801 Secondary malignant neoplasm of right lung: Secondary | ICD-10-CM | POA: Insufficient documentation

## 2024-04-17 DIAGNOSIS — C792 Secondary malignant neoplasm of skin: Secondary | ICD-10-CM | POA: Diagnosis not present

## 2024-04-17 DIAGNOSIS — C799 Secondary malignant neoplasm of unspecified site: Secondary | ICD-10-CM | POA: Diagnosis not present

## 2024-04-17 DIAGNOSIS — D6481 Anemia due to antineoplastic chemotherapy: Secondary | ICD-10-CM | POA: Diagnosis not present

## 2024-04-17 DIAGNOSIS — T451X5A Adverse effect of antineoplastic and immunosuppressive drugs, initial encounter: Secondary | ICD-10-CM

## 2024-04-17 DIAGNOSIS — G620 Drug-induced polyneuropathy: Secondary | ICD-10-CM

## 2024-04-17 DIAGNOSIS — I10 Essential (primary) hypertension: Secondary | ICD-10-CM | POA: Insufficient documentation

## 2024-04-17 DIAGNOSIS — K521 Toxic gastroenteritis and colitis: Secondary | ICD-10-CM | POA: Diagnosis not present

## 2024-04-17 DIAGNOSIS — Z933 Colostomy status: Secondary | ICD-10-CM | POA: Diagnosis not present

## 2024-04-17 DIAGNOSIS — I700 Atherosclerosis of aorta: Secondary | ICD-10-CM | POA: Insufficient documentation

## 2024-04-17 DIAGNOSIS — C7951 Secondary malignant neoplasm of bone: Secondary | ICD-10-CM | POA: Insufficient documentation

## 2024-04-17 DIAGNOSIS — Z86711 Personal history of pulmonary embolism: Secondary | ICD-10-CM

## 2024-04-17 DIAGNOSIS — E7800 Pure hypercholesterolemia, unspecified: Secondary | ICD-10-CM | POA: Insufficient documentation

## 2024-04-17 DIAGNOSIS — C7802 Secondary malignant neoplasm of left lung: Secondary | ICD-10-CM | POA: Insufficient documentation

## 2024-04-17 DIAGNOSIS — Z86718 Personal history of other venous thrombosis and embolism: Secondary | ICD-10-CM | POA: Diagnosis not present

## 2024-04-17 DIAGNOSIS — N281 Cyst of kidney, acquired: Secondary | ICD-10-CM | POA: Diagnosis not present

## 2024-04-17 DIAGNOSIS — Z5112 Encounter for antineoplastic immunotherapy: Secondary | ICD-10-CM | POA: Insufficient documentation

## 2024-04-17 DIAGNOSIS — J90 Pleural effusion, not elsewhere classified: Secondary | ICD-10-CM | POA: Diagnosis not present

## 2024-04-17 DIAGNOSIS — Z5189 Encounter for other specified aftercare: Secondary | ICD-10-CM | POA: Diagnosis not present

## 2024-04-17 DIAGNOSIS — R188 Other ascites: Secondary | ICD-10-CM | POA: Insufficient documentation

## 2024-04-17 DIAGNOSIS — K56699 Other intestinal obstruction unspecified as to partial versus complete obstruction: Secondary | ICD-10-CM | POA: Diagnosis not present

## 2024-04-17 DIAGNOSIS — I2510 Atherosclerotic heart disease of native coronary artery without angina pectoris: Secondary | ICD-10-CM | POA: Diagnosis not present

## 2024-04-17 DIAGNOSIS — Z800 Family history of malignant neoplasm of digestive organs: Secondary | ICD-10-CM | POA: Insufficient documentation

## 2024-04-17 DIAGNOSIS — Z87891 Personal history of nicotine dependence: Secondary | ICD-10-CM | POA: Insufficient documentation

## 2024-04-17 DIAGNOSIS — Z5111 Encounter for antineoplastic chemotherapy: Secondary | ICD-10-CM

## 2024-04-17 DIAGNOSIS — Z79899 Other long term (current) drug therapy: Secondary | ICD-10-CM | POA: Insufficient documentation

## 2024-04-17 DIAGNOSIS — K435 Parastomal hernia without obstruction or  gangrene: Secondary | ICD-10-CM | POA: Diagnosis not present

## 2024-04-17 LAB — CMP (CANCER CENTER ONLY)
ALT: 21 U/L (ref 0–44)
AST: 39 U/L (ref 15–41)
Albumin: 3.7 g/dL (ref 3.5–5.0)
Alkaline Phosphatase: 74 U/L (ref 38–126)
Anion gap: 6 (ref 5–15)
BUN: 21 mg/dL (ref 8–23)
CO2: 27 mmol/L (ref 22–32)
Calcium: 9.3 mg/dL (ref 8.9–10.3)
Chloride: 103 mmol/L (ref 98–111)
Creatinine: 0.73 mg/dL (ref 0.44–1.00)
GFR, Estimated: >60 mL/min (ref >60)
Glucose, Bld: 112 mg/dL — ABNORMAL HIGH (ref 70–99)
Potassium: 3.5 mmol/L (ref 3.5–5.1)
Sodium: 136 mmol/L (ref 135–145)
Total Bilirubin: 0.6 mg/dL (ref 0.0–1.2)
Total Protein: 6.9 g/dL (ref 6.5–8.1)

## 2024-04-17 LAB — CBC WITH DIFFERENTIAL (CANCER CENTER ONLY)
Abs Immature Granulocytes: 0.02 10*3/uL (ref 0.00–0.07)
Basophils Absolute: 0 10*3/uL (ref 0.0–0.1)
Basophils Relative: 0 %
Eosinophils Absolute: 0.1 10*3/uL (ref 0.0–0.5)
Eosinophils Relative: 2 %
HCT: 33.6 % — ABNORMAL LOW (ref 36.0–46.0)
Hemoglobin: 10.9 g/dL — ABNORMAL LOW (ref 12.0–15.0)
Immature Granulocytes: 0 %
Lymphocytes Relative: 19 %
Lymphs Abs: 1 10*3/uL (ref 0.7–4.0)
MCH: 33.3 pg (ref 26.0–34.0)
MCHC: 32.4 g/dL (ref 30.0–36.0)
MCV: 102.8 fL — ABNORMAL HIGH (ref 80.0–100.0)
Monocytes Absolute: 0.5 10*3/uL (ref 0.1–1.0)
Monocytes Relative: 10 %
Neutro Abs: 3.5 10*3/uL (ref 1.7–7.7)
Neutrophils Relative %: 69 %
Platelet Count: 195 10*3/uL (ref 150–400)
RBC: 3.27 MIL/uL — ABNORMAL LOW (ref 3.87–5.11)
RDW: 17.6 % — ABNORMAL HIGH (ref 11.5–15.5)
WBC Count: 5.1 10*3/uL (ref 4.0–10.5)
nRBC: 0 % (ref 0.0–0.2)

## 2024-04-17 LAB — TOTAL PROTEIN, URINE DIPSTICK: Protein, ur: NEGATIVE mg/dL

## 2024-04-17 MED ORDER — SODIUM CHLORIDE 0.9 % IV SOLN
5.0000 mg/kg | Freq: Once | INTRAVENOUS | Status: AC
  Administered 2024-04-17: 350 mg via INTRAVENOUS
  Filled 2024-04-17: qty 14

## 2024-04-17 MED ORDER — HEPARIN SOD (PORK) LOCK FLUSH 100 UNIT/ML IV SOLN
500.0000 [IU] | Freq: Once | INTRAVENOUS | Status: AC | PRN
  Administered 2024-04-17: 500 [IU]
  Filled 2024-04-17: qty 5

## 2024-04-17 MED ORDER — SODIUM CHLORIDE 0.9 % IV SOLN
INTRAVENOUS | Status: DC
  Filled 2024-04-17: qty 250

## 2024-04-17 NOTE — Assessment & Plan Note (Signed)
 Stable symptoms, Grade 1-2.patient copes well.

## 2024-04-17 NOTE — Assessment & Plan Note (Signed)
 Hb stable close monitor.

## 2024-04-17 NOTE — Assessment & Plan Note (Signed)
 continue lomotil  QID PRN as instructed.

## 2024-04-17 NOTE — Assessment & Plan Note (Signed)
 History of PE and bilateral lower extremity DVT  Continue  Eliquis  2.5 mg twice daily for anticoagulation prophylaxis. Recommend leg elevation and compression stocking.

## 2024-04-17 NOTE — Assessment & Plan Note (Signed)
 Prophylactic Nneulasta to D15 treatment of each cycle

## 2024-04-17 NOTE — Assessment & Plan Note (Addendum)
 History of stage IIIC Rectal cancer, s/p TNT, followed by 09/17/19 APR/posterior vaginectomy/TAH/BSO/VY-flap, pT4b pN0 with close vaginal margin 0.2 mm.  Uterus and ovaries negative for malignancy. palliative radiation to vaginal recurrence- 01/19/21 recurrence with lung metastasis.-Palliative -FOLFIRI plus bevacizumab .  Irinotecan  was dropped in November 2022 due to side effects. Negative for UGT1A1*28 - radiographically stable, rise of CEA-July 2023 CT lung metastasis worse--> Dec 2023 PET showed progression in pelvic lymph nodes and bone lesions,--> 2nd line irinotecan  +panitumumab -->CT March 2024 stable. --> June 2024 CT showed mild lung progression/Progressive left iliac bone metastasis, vulvar mass difficult for biopsy in office,  Tempus Liquid biopsy - no actionable variants --> 06/25/2023 CT showed progression of lung nodules [SLD increase 34mm] and iliac inguinal lymphadenopathy--> 07/23/23 switch to dose reduced FOLFOX --> Dec 2024 CT- stable disease. --> Feb 2025 disease progression--> longsurf + Bevacizumab   Labs are reviewed and discussed with patient   CEA nadir was in 490, progressively increases - 10,193  Currently on longsurf [60mg  BID day 1-5 and day 8- 12 every 28-day  + Bevacizumab  D1 proceed with D15 Bevacizumab  Proceed with cycle 5  D1 Longsuf and  Bevacizumab  CT findings are discussed with patient and daughter. Will review images on tumor board.

## 2024-04-17 NOTE — Assessment & Plan Note (Signed)
 S/p skin nodule biopsy confirmed metastatic adenocarcinoma consistent with colorectal origin.  No need for palliative RT per Radonc Follow up with wound care

## 2024-04-17 NOTE — Progress Notes (Signed)
 Hematology/Oncology Progress note [phone] [phone]      CHIEF COMPLAINTS/REASON FOR VISIT:  Follow up for rectal cancer  ASSESSMENT & PLAN:   Cancer Staging  Rectal cancer [facility]) Staging form: Colon and Rectum, AJCC 8th Edition - Pathologic stage from 10/06/2019: Stage IIC (ypT4b, ypN0, cM0) - Signed by Jacqueline Call, MD on 10/06/2019 - Clinical stage from 06/30/2020: Jacqueline Yoder - Signed by Jacqueline Call, MD on 04/17/2022 - Pathologic: Stage Unknown (rpTX, rpNX, rcM1) - Signed by Jacqueline Call, MD on 01/31/2021   Rectal cancer [facility]) History of stage IIIC Rectal cancer, s/p TNT, followed by 09/17/19 APR/posterior vaginectomy/TAH/BSO/VY-flap, pT4b pN0 with close vaginal margin 0.2 mm.  Uterus and ovaries negative for malignancy. palliative radiation to vaginal recurrence- 01/19/21 recurrence with lung metastasis.-Palliative -FOLFIRI plus bevacizumab .  Irinotecan  was dropped in November 2022 due to side effects. Negative for UGT1A1*28 - radiographically stable, rise of CEA-July 2023 CT lung metastasis worse--> Dec 2023 PET showed progression in pelvic lymph nodes and bone lesions,--> 2nd line irinotecan  +panitumumab -->CT March 2024 stable. --> June 2024 CT showed mild lung progression/Progressive left iliac bone metastasis, vulvar mass difficult for biopsy in office,  Tempus Liquid biopsy - no actionable variants --> 06/25/2023 CT showed progression of lung nodules [SLD increase 52mm] and iliac inguinal lymphadenopathy--> 07/23/23 switch to dose reduced FOLFOX --> Dec 2024 CT- stable disease. --> Feb 2025 disease progression--> longsurf + Bevacizumab   Labs are reviewed and discussed with patient   CEA nadir was in 490, progressively increases - 10,193  Currently on longsurf [60mg  BID day 1-5 and day 8- 12 every 28-day  + Bevacizumab  D1 proceed with D15 Bevacizumab  Proceed with cycle 5  D1 Longsuf and  Bevacizumab  CT findings are discussed with patient and daughter. Will review images  on tumor board.    Metastasis to skin [facility]) S/p skin nodule biopsy confirmed metastatic adenocarcinoma consistent with colorectal origin.  No need for palliative RT per Radonc Follow up with wound care  History of pulmonary embolism History of PE and bilateral lower extremity DVT  Continue  Eliquis  2.5 mg twice daily for anticoagulation prophylaxis. Recommend leg elevation and compression stocking.   Encounter for antineoplastic chemotherapy Chemotherapy plan as listed above.   Drug-induced neutropenia (HCC) Prophylactic Nneulasta to D15 treatment of each cycle  Chemotherapy-induced neuropathy (HCC) Stable symptoms, Grade 1-2.patient copes well.   Chemotherapy induced diarrhea continue lomotil   QID PRN as instructed.  .   Anemia due to antineoplastic chemotherapy Hb stable close monitor.   Follow up 2 week[s].  All questions were answered. The patient knows to Yoder the clinic with any problems, questions or concerns.  Yoder Babara, MD, PhD [facility] Hematology Oncology 04/17/2024      HISTORY OF PRESENTING ILLNESS:  Oncology History  Rectal cancer [facility])  01/23/2019 Initial Diagnosis   Rectal cancer [facility] Patient initially presented with complaints of postmenopausal bleeding on 08/16/2018.  History of was menopausal vaginal bleeding in 2016 which resulted in cervical polypectomy.  Pathology 02/04/2015 showed cervical polyp, consistent with benign endometrial polyp.  [name] after polypectomy due to anxiety associated with pelvic exams.  pelvic exam on 08/16/2018 reviewed cervical abnormality and from enlarged uterus. Seen by Dr. Connell on 10/29/2018.  Endometrial biopsy and a Pap smear was performed. 10/29/2018 Pap smear showed adenocarcinoma, favor endometrial origin. 10/29/2018 endometrial biopsy showed endometrioid carcinoma, FIGO grade 1.  10/29/2018- TA & TV Ultrasound revealed: Anteverted uterus measuring 8.7 x 5.6 x 6.4 cm without evidence of focal  masses.   The endometrium measuring 24.1 mm (thickened) and heterogeneous.  Right and left ovaries not visualized.  No adnexal masses identified.  No free fluid in cul-de-sac.  Patient was seen by Dr. Elby in clinic on 11/13/2018.  Cervical exam reveals 2 cm exophytic irregular mass consistent with malignancy.    11/19/2018 CT chest abdomen pelvis with contrast showed thickened endometrium with some irregularity compatible with the provided diagnosis of endometrial malignancy.  There is a mildly prominent left inguinal node 1.4 cm.  Patient was seen by Dr. Mancil on 11/20/2018 and left groin lymph node biopsy was recommended.  11/26/2018 patient underwent left inguinal lymph node biopsy. Pathology showed metastatic adenocarcinoma consistent with colorectal origin.  CDX 2+.  Case was discussed on tumor board.  Recommend colonoscopy for further evaluation.  Patient reports significant weight loss 30 pounds over the last year.  Chronic vaginal spotting. Change of bowel habits the past few months.  More constipated.  Family history positive for brother who has colon cancer prostate cancer.  patient has underwent colonoscopy on 12/03/2018 which reviewed a nonobstructing large mass in the rectum.  Also chronic fistula.  Mass was not circumferential.  This was biopsied with a cold forceps for histology.  Pathology came back hyperplastic polyp negative for dysplasia and malignancy. [name], patient underwent flex sigmoidoscopy on 12/06/2018 with rebiopsy of the rectal mass. This time biopsy results came back positive for invasive colorectal adenocarcinoma, moderately differentiated. Immunotherapy for nearly mismatch repair protein (MMR ) was performed.  There is no loss of MMR expression.  low probability of MSI high.   # Seen by [name] for evaluation of resectability for rectal cancer. In addition, she also had a second opinion with [facility] pathology where her endometrial  biopsy pathology was changed to  adenocarcinoma, consistent with colorectal primary.   [name]. She has home health that has been assisting with ostomy care  Patient was also evaluated by [facility] oncology.  Recommendation is to proceed with TNT with concurrent chemoradiation followed by neoadjuvant chemotherapy followed by surgical resection. Patient prefers to have treatment done locally with [facility].   # Oncology Treatment:  02/03/2019- 03/19/2019  concurrent Xeloda  and radiation.  Xeloda  dose 825mg  /m2 BID - rounded to 1650mg  BID- on days of radiation. 04/09/2019, started on FOLFOX with bolus early.  Omitted.  07/16/2019 finished 8 cycles of FOLFOX. 09/17/19 APR/posterior vaginectomy/TAH/BSO/VY-flap pT4b pN0 with close vaginal margin 0.2 mm.  Uterus and ovaries negative for malignancy.   10/06/2019 Cancer Staging   Staging form: Colon and Rectum, AJCC 8th Edition - Pathologic stage from 10/06/2019: Stage IIC (ypT4b, pN0, cM0) - Signed by Jacqueline Call, MD on 10/06/2019   06/30/2020 Cancer Staging   Staging form: Colon and Rectum, AJCC 8th Edition - Clinical stage from 06/30/2020: Jacqueline Dia Maywood - Signed by Jacqueline Call, MD on 04/17/2022 Stage prefix: Recurrence Total positive nodes: 1   06/30/2020 Relapse/Recurrence   vaginal introitus mass biopsied. Pathology is consistent with metastatic colorectal adenocarcinoma    07/20/2020 - 09/13/2020 Chemotherapy   Concurrent chemotherapy with Xeloda  with Radiation.  08/02/2020-08/16/2020, . Xeloda  was held due to neutropenia    08/17/2020 - 09/06/2020 Radiation Therapy   Xeloda  1500 mg twice daily concurrently with radiation    01/31/2021 Cancer Staging   Staging form: Colon and Rectum, AJCC 8th Edition - Pathologic: Stage Unknown (rpTX, pNX, cM1) - Signed by Jacqueline Call, MD on 01/31/2021 Stage prefix: Recurrence   01/31/2021 -  08/20/2021 Chemotherapy   FOLFIRI + Bev  Irinotecan  dose was reduced, eventually 100mg /m2  Irinotecan  was  dropped in November 2022 due to side effects. Negative for UGT1A1*28    09/13/2021 -  Chemotherapy   maintenance 5-FU/bevacizumab     11/28/2021 -  Chemotherapy   5-FU/Irinotecan /bevacizumab . Irinotecan  100 mg/m2 was added back due to progressively increasing CEA.    07/14/2022 Imaging   Bone scan  1. No evidence of skeletal metastatic disease in the hips. 2. Asymmetric increased activity in the RIGHT humeral head is favored degenerative as described above. If pain in the RIGHT shoulder consider further evaluation with plain film or MRI   07/17/2022 Imaging   1. Stable scattered small bilateral pulmonary nodules, largest 8 by 6 mm in the right middle lobe. 2. Subtle chronically stable posterior cortical irregularity and heterogeneity in the left iliac bone, not appreciable on the bone scan of 07/14/2022. This has been present at least over the past year and could reflect radiation therapy related findings although strictly speaking, malignant involvement of the left iliac bone is difficult to exclude. If clinically warranted this could be further assessed with nuclear medicine PET-CT or MRI of the bony pelvis with and without contrast. 3. Pelvic floor laxity with loops of small bowel extending 3 cm below the pubococcygeal line. 4. Aortic and systemic atherosclerosis as detailed above. Aortic Atherosclerosis    12/26/2022 Imaging   CT chest abdomen pelvis w contrast 1. Stable scattered solid pulmonary nodules. 2. Right inguinal and right pelvic lymph nodes which were hypermetabolic on prior PET-CT are stable. 3. Osseous lesion of the proximal right humerus demonstrates increased sclerosis when compared with the prior PET-CT, possibly due to treatment response. 4. 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 5. Aortic Atherosclerosis     06/25/2023 Imaging   CT chest abdomen pelvis w contrast showed 1. Increased size of the solid bilateral pulmonary nodules,consistent with worsening  metastatic disease. 2. Increased size of the right external iliac and inguinal lymph nodes, concerning for worsening metastatic disease. 3. No significant interval change in the left iliac bone osseous metastasis. 4. Enlarged main pulmonary artery measuring 3.6 cm, which can be seen in the setting of pulmonary arterial hypertension. 5.  Aortic Atherosclerosis (ICD10-I70.0)     Miscellaneous   Tempus liquid biopsy showed no actionable variants.  NOTCH1 missense variant, BRCA2 missense variant , MYC missense variant   07/11/2023 Imaging   PET showed 1. Multifocal tracer avid abdominopelvic lymph nodes are identified compatible with metastatic disease. 2. Multifocal bilateral pulmonary nodules are identified compatible with pulmonary metastasis.  3. Diffuse increased radiotracer uptake throughout the marrow space is identified diminishing sensitivity for detecting osseous metastasis. No abnormal increased radiotracer uptake identified above background increased bone marrow activity to indicate metabolically active bone metastases. 4.  Aortic Atherosclerosis   07/23/2023 - 11/13/2023 Chemotherapy   switch to dose reduced FOLFOX    09/16/2023 Imaging   CT abdomen pelvis w contrast  1. Small-bowel obstruction with transition point in the anterior aspect of the lower abdomen, left of midline. This may be secondary to adhesions. 2. Small volume ascites. 3. Prior colon resection with left anterior abdominal wall colostomy. Adjacent parastomal hernia. Small volume fluid within the hernia sac. 4. Stable pulmonary metastatic disease. 5. Stable metastatic lesion of the left iliac wing. 6. Enlarged right external iliac chain lymph node measuring 1.4 cm, previously 1.3 cm. Several mildly prominent retroperitoneal lymph nodes have increased in size from prior but remain subcentimeter in size.  7. Aortic atherosclerosis (ICD10-I70.0).   09/16/2023 - 09/20/2023 Hospital Admission   Hospitalized due to  Small bowel obstruction due to adhesions    09/24/2023 Imaging   1. Bilateral pulmonary metastases are all stable from 06/25/2023 chest CT. 2. No thoracic adenopathy. No new or progressive metastatic disease in the chest. 3. Trace bilateral posterior pleural effusions. 4. Two-vessel coronary atherosclerosis. 5. Dilated main pulmonary artery, suggesting pulmonary arterial hypertension. 6.  Aortic Atherosclerosis (ICD10-I70.0).   11/28/2023 Imaging   CT chest abdomen pelvis with contrast 1. Increased size of retroperitoneal, iliac side chain and inguinal lymph nodes, concerning for progressive nodal metastatic disease. 2. Numerous bilateral solid pulmonary nodules are again seen, some of which have increased in size and others are stable in size overall compatible with disease progression. 3. Increased size of the subcapsular lesion in the anterior inferior right lobe of the liver, which has been stable over multiple prior examinations and was not FDG avid on prior PET-CT. Suggest attention on follow-up imaging. 4. 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 5. Questionable wall thickening of matted loops of small bowel in the left lower quadrant, suggest attention on follow-up imaging. 6. Small volume abdominopelvic free fluid. 7.  Aortic Atherosclerosis (ICD10-I70.0).   12/13/2023 -  Chemotherapy   Patient is on Treatment Plan : COLORECTAL Bevacizumab  + Trifluridine /Tipiracil  q28d     01/01/2024 Procedure   Right inguinal crease skin nodule biopsy  Pathology showed dermal deposits of metastatic colon adenocarcinoma, extending to the edge and base.      01/14/2024 Imaging   PET scan showed  1. Progressive disease with progressive left supraclavicular adenopathy, progressive pulmonary metastatic disease, progressive abdominal and pelvic adenopathy, and progressive multifocal skeletal involvement. 2. Substantial multifocal skeletal  involvement including the right proximal humerus, right medial clavicle, left T11 vertebral body, L4 posterior elements, and entire left iliac crest. The permeative lesion of the right humerus may have some extraosseous extension. Similarly there is cortical breakthrough in the large left iliac crest lesion. 3. Hypermetabolic irregular tumor extends along the right inguinal ring region and right hip adductor musculature as well as in the subcutaneous and cutaneous tissues along the right inguinal fold, compatible with tumor involvement. 4. Right eccentric vulvar activity just below the clitoris has a maximum SUV of 7.5, and given the regional soft tissue metastatic disease, a metastatic focus in this vicinity is not excluded. 5. The subcapsular lesion along the anterior inferior right hepatic lobe described on CT of 11/28/2023 is not discernibly hypermetabolic or visible on today's PET-CT. 6. Descending left colostomy with mild diverticulosis. 7.  Aortic Atherosclerosis (ICD10-I70.0).    04/10/2024 Imaging   CT chest abdomen pelvis w contrast showed 1. Status post abdominoperineal resection with left lower quadrant end colostomy. 2. New, eccentric, nodular wall thickening of the colon at the hepatic flexure measuring 2.3 x 1.4 cm. This may be transient and peristaltic however a metastatic peritoneal implant or metachronous colon malignancy not excluded. Attention on follow-up. 3. Otherwise no significant change in previously FDG avid widespread pulmonary, nodal, and osseous metastatic disease, as well as soft tissue involvement of the right lower quadrant abdominal wall and pannus. 4. Coronary artery disease.     Metastatic adenocarcinoma (HCC)  12/13/2023 -  Chemotherapy   Patient is on Treatment Plan : COLORECTAL Bevacizumab  + Trifluridine /Tipiracil  q28d         INTERVAL HISTORY Jacqueline Yoder is a 78 y.o. female who has above history reviewed by  me presents for follow-up  of rectal cancer. She was accompanied by her daughter. Appetite is not good + manageable neuropathy symptoms of her fingertips, intermittent,  worse when exposed to cold temperature.  + chronic loose ostomy output, stable, not worse.  + right groin skin mets, open wound, follows up with wound care.  She has not needed to take pain meds. Weight is stable. No fever or chills.   Review of Systems  Constitutional:  Positive for fatigue. Negative for appetite change, chills, fever and unexpected weight change.  HENT:   Negative for hearing loss and voice change.   Eyes:  Negative for eye problems.  Respiratory:  Negative for chest tightness and cough.   Cardiovascular:  Negative for chest pain.  Gastrointestinal:  Positive for diarrhea. Negative for abdominal distention, abdominal pain, blood in stool, constipation and nausea.  Endocrine: Negative for hot flashes.  Genitourinary:  Negative for difficulty urinating and frequency.   Musculoskeletal:  Positive for arthralgias.        right shoulder pain.   Skin:  Positive for rash. Negative for itching.  Neurological:  Positive for numbness. Negative for extremity weakness.  Hematological:  Negative for adenopathy.  Psychiatric/Behavioral:  Negative for confusion.     MEDICAL HISTORY:  Past Medical History:  Diagnosis Date   Allergy    Arthritis    Blood clot in vein    Family history of colon cancer    GERD (gastroesophageal reflux disease)    Hypercholesteremia    Hypertension    Hypertension    Lower extremity edema    Personal history of chemotherapy    Rectal cancer (HCC) 12/2018   Urinary incontinence     SURGICAL HISTORY: Past Surgical History:  Procedure Laterality Date   ABDOMINAL HYSTERECTOMY     CHOLECYSTECTOMY  1971   COLONOSCOPY WITH PROPOFOL  N/A 12/03/2018   Procedure: COLONOSCOPY WITH PROPOFOL ;  Surgeon: Jinny Carmine, MD;  Location: [facility] ENDOSCOPY;  Service: Endoscopy;  Laterality: N/A;   COLONOSCOPY WITH  PROPOFOL  N/A 07/15/2020   Procedure: COLONOSCOPY WITH PROPOFOL ;  Surgeon: Therisa Bi, MD;  Location: [facility] ENDOSCOPY;  Service: Gastroenterology;  Laterality: N/A;   FLEXIBLE SIGMOIDOSCOPY N/A 12/06/2018   Procedure: FLEXIBLE SIGMOIDOSCOPY;  Surgeon: Therisa Bi, MD;  Location: [facility] ENDOSCOPY;  Service: Endoscopy;  Laterality: N/A;   LAPAROSCOPIC COLOSTOMY  01/06/2019   PORTACATH PLACEMENT N/A 04/03/2019   Procedure: INSERTION PORT-A-CATH;  Surgeon: Jordis Laneta FALCON, MD;  Location: [facility] ORS;  Service: General;  Laterality: N/A;    SOCIAL HISTORY: Social History   Socioeconomic History   Marital status: Widowed    Spouse name: Not on file   Number of children: Not on file   Years of education: Not on file   Highest education level: Some college, no degree  Occupational History   Not on file  Tobacco Use   Smoking status: Former    Current packs/day: 0.00    Types: Cigarettes    Quit date: 12/02/1977    Years since quitting: 46.4   Smokeless tobacco: Former  Building services engineer status: Never Used  Substance and Sexual Activity   Alcohol use: Never   Drug use: Never   Sexual activity: Not Currently    Birth control/protection: None  Other Topics Concern   Not on file  Social History Narrative   Lives with daughter   Social Drivers of Health   Financial Resource Strain: Low Risk  (11/15/2023)   Overall Financial Resource Strain (CARDIA)  Difficulty of Paying Living Expenses: Not hard at all  Food Insecurity: No Food Insecurity (11/15/2023)   Hunger Vital Sign    Worried About Running Out of Food in the Last Year: Never true    Ran Out of Food in the Last Year: Never true  Transportation Needs: No Transportation Needs (11/15/2023)   PRAPARE - Administrator, Civil Service (Medical): No    Lack of Transportation (Non-Medical): No  Physical Activity: Insufficiently Active (11/15/2023)   Exercise Vital Sign    Days of Exercise per Week: 3 days    Minutes of Exercise  per Session: 10 min  Stress: No Stress Concern Present (11/15/2023)   [employer] of Occupational Health - Occupational Stress Questionnaire    Feeling of Stress : Not at all  Social Connections: Moderately Integrated (11/15/2023)   Social Connection and Isolation Panel    Frequency of Communication with Friends and Family: More than three times a week    Frequency of Social Gatherings with Friends and Family: More than three times a week    Attends Religious Services: More than 4 times per year    Active Member of [employer] or Organizations: Yes    Attends Banker Meetings: More than 4 times per year    Marital Status: Widowed  Intimate Partner Violence: Not At Risk (09/27/2023)   Humiliation, Afraid, Rape, and Kick questionnaire    Fear of Current or Ex-Partner: No    Emotionally Abused: No    Physically Abused: No    Sexually Abused: No    FAMILY HISTORY: Family History  Problem Relation Age of Onset   Colon cancer Brother 61       exposure to chemicals [place]   Hypertension Mother    Stroke Mother    Kidney failure Father    Breast cancer Neg Hx    Ovarian cancer Neg Hx     ALLERGIES:  is allergic to sulfamethoxazole-trimethoprim.  MEDICATIONS:  Current Outpatient Medications  Medication Sig Dispense Refill   acetaminophen  (TYLENOL ) 650 MG CR tablet Take 650 mg by mouth every 8 (eight) hours as needed for pain.     apixaban  (ELIQUIS ) 2.5 MG TABS tablet Take 1 tablet (2.5 mg total) by mouth 2 (two) times daily. 200 tablet 2   Cholecalciferol  (VITAMIN D3) 2000 units capsule Take 2,000 Units by mouth daily.     diclofenac  sodium (VOLTAREN ) 1 % GEL Apply 2 g topically 4 (four) times daily as needed (joint pain).  11   diphenoxylate -atropine  (LOMOTIL ) 2.5-0.025 MG tablet Take 1 tablet by mouth 4 (four) times daily as needed for diarrhea or loose stools. 120 tablet 0   fluticasone  (FLONASE ) 50 MCG/ACT nasal spray Place 1 spray into both nostrils daily as needed  for allergies or rhinitis.     lidocaine -prilocaine  (EMLA ) cream Apply 1 Application topically daily as needed. Apply small amount to port and cover with saran wrap 1-2 hours prior to port access 30 g 3   loratadine  (CLARITIN ) 10 MG tablet Take 10 mg by mouth daily.     magnesium  chloride (SLOW-MAG) 64 MG TBEC SR tablet Take 1 tablet (64 mg total) by mouth 2 (two) times daily. 60 tablet 2   megestrol  (MEGACE ) 40 MG tablet Take 1 tablet (40 mg total) by mouth 2 (two) times daily. 60 tablet 1   Multiple Vitamins-Minerals (ONE-A-DAY WOMENS 50 PLUS PO) Take 1 tablet by mouth daily.      ondansetron  (ZOFRAN ) 8 MG tablet Take  1 tablet (8 mg total) by mouth every 8 (eight) hours as needed for nausea or vomiting. 60 tablet 1   potassium chloride  SA (KLOR-CON  M) 20 MEQ tablet TAKE 1 TABLET BY MOUTH DAILY 100 tablet 1   prochlorperazine  (COMPAZINE ) 10 MG tablet Take 1 tablet (10 mg total) by mouth every 6 (six) hours as needed (NAUSEA). 30 tablet 1   simvastatin  (ZOCOR ) 40 MG tablet Take 1 tablet (40 mg total) by mouth at bedtime. 100 tablet 3   tiZANidine  (ZANAFLEX ) 4 MG tablet Take 0.5 tablets (2 mg total) by mouth every 8 (eight) hours as needed for muscle spasms. 10 tablet 0   triamterene -hydrochlorothiazide  (DYAZIDE ) 37.5-25 MG capsule Take 1 each (1 capsule total) by mouth daily. 90 capsule 1   trifluridine -tipiracil  (LONSURF ) 20-8.19 MG tablet Take 3 tablets (60 mg of trifluridine  total) by mouth 2 (two) times daily. 1hr after AM & PM meals days 1-5, 8-12. Repeat every 28d. 60 tablet 1   zinc gluconate 50 MG tablet Take 50 mg by mouth daily.     No current facility-administered medications for this visit.   Facility-Administered Medications Ordered in Other Visits  Medication Dose Route Frequency Provider Last Rate Last Admin   0.9 %  sodium chloride  infusion   Intravenous Continuous Jacqueline Call, MD   Stopped at 04/17/24 1008   heparin  lock flush 100 UNIT/ML injection              PHYSICAL  EXAMINATION: ECOG PERFORMANCE STATUS: 1 - Symptomatic but completely ambulatory  Physical Exam Constitutional:      General: She is not in acute distress. HENT:     Head: Normocephalic and atraumatic.  Eyes:     General: No scleral icterus. Cardiovascular:     Rate and Rhythm: Normal rate.     Heart sounds: Normal heart sounds.  Pulmonary:     Effort: Pulmonary effort is normal. No respiratory distress.     Breath sounds: No wheezing.  Abdominal:     General: Bowel sounds are normal. There is no distension.     Palpations: Abdomen is soft.     Comments: + Colostomy bag   Musculoskeletal:        General: No deformity. Normal range of motion.     Cervical back: Normal range of motion and neck supple.     Right lower leg: Edema present.  Skin:    General: Skin is warm and dry.     Comments: Right groin skin nodules with open wound covered with dressing.   Neurological:     Mental Status: She is alert and oriented to person, place, and time. Mental status is at baseline.       LABORATORY DATA:  I have reviewed the data as listed    Latest Ref Rng & Units 04/17/2024    8:16 AM 04/03/2024    8:27 AM 03/20/2024    8:18 AM  CBC  WBC 4.0 - 10.5 K/uL 5.1  4.0  5.0   Hemoglobin 12.0 - 15.0 g/dL 89.0  89.2  88.1   Hematocrit 36.0 - 46.0 % 33.6  32.3  36.4   Platelets 150 - 400 K/uL 195  170  228       Latest Ref Rng & Units 04/17/2024    8:16 AM 04/03/2024    8:27 AM 03/20/2024    8:18 AM  CMP  Glucose 70 - 99 mg/dL 887  893  97   BUN 8 - 23 mg/dL 21  17  25   Creatinine 0.44 - 1.00 mg/dL 9.26  9.38  9.21   Sodium 135 - 145 mmol/L 136  135  138   Potassium 3.5 - 5.1 mmol/L 3.5  3.6  3.6   Chloride 98 - 111 mmol/L 103  101  102   CO2 22 - 32 mmol/L 27  26  26    Calcium  8.9 - 10.3 mg/dL 9.3  9.3  9.3   Total Protein 6.5 - 8.1 g/dL 6.9  6.8  7.4   Total Bilirubin 0.0 - 1.2 mg/dL 0.6  0.7  0.5   Alkaline Phos 38 - 126 U/L 74  65  77   AST 15 - 41 U/L 39  31  36   ALT 0 - 44 U/L  21  17  21       RADIOGRAPHIC STUDIES: I have personally reviewed the radiological images as listed and agreed with the findings in the report. CT CHEST ABDOMEN PELVIS W CONTRAST Result Date: 04/16/2024 CLINICAL DATA:  Metastatic rectal cancer restaging * Tracking Code: BO * EXAM: CT CHEST, ABDOMEN, AND PELVIS WITH CONTRAST TECHNIQUE: Multidetector CT imaging of the chest, abdomen and pelvis was performed following the standard protocol during bolus administration of intravenous contrast. RADIATION DOSE REDUCTION: This exam was performed according to the departmental dose-optimization program which includes automated exposure control, adjustment of the mA and/or kV according to patient size and/or use of iterative reconstruction technique. CONTRAST:  85mL OMNIPAQUE  IOHEXOL  300 MG/ML  SOLN COMPARISON:  PET-CT, 01/14/2024 FINDINGS: CT CHEST FINDINGS Cardiovascular: Aortic atherosclerosis. Normal heart size. Left coronary artery calcifications. No pericardial effusion. Mediastinum/Nodes: No significant change in enlarged lower left cervical lymph node measuring 2.1 x 1.3 cm, previously FDG avid (series 2, image 6). Thyroid  gland, trachea, and esophagus demonstrate no significant findings. Lungs/Pleura: Numerous bilateral pulmonary nodules not significantly changed, largest index nodule of the medial segment right middle lobe measuring 1.5 x 1.4 cm (series 3, image 99), additional index nodule in the peripheral left upper lobe measuring 0.7 cm (series 3, image 40). No pleural effusion or pneumothorax. Musculoskeletal: No chest wall abnormality. No acute osseous findings. CT ABDOMEN PELVIS FINDINGS Hepatobiliary: No focal liver abnormality is seen. Status post cholecystectomy. No biliary dilatation. Pancreas: Unremarkable. No pancreatic ductal dilatation or surrounding inflammatory changes. Spleen: Normal in size without significant abnormality. Adrenals/Urinary Tract: Adrenal glands are unremarkable. Multiple benign  bilateral renal cortical cysts, for which no further follow-up or characterization is required. Kidneys are otherwise normal, without renal calculi, solid lesion, or hydronephrosis. Bladder is unremarkable. Stomach/Bowel: Stomach is within normal limits. Normal appendix. Status post abdominoperineal resection with left lower quadrant end colostomy. New, eccentric, nodular wall thickening of the colon at the hepatic flexure measuring 2.3 x 1.4 cm (series 2, image 69). Vascular/Lymphatic: Aortic atherosclerosis. Similar appearance of numerous matted, previously FDG avid retroperitoneal and pelvic lymph nodes, index left retroperitoneal node or conglomerate measuring 2.0 x 1.4 cm (series 2, image 66). Unchanged index right external iliac node measures 1.8 x 1.4 cm with matted soft tissue about the right common femoral artery likewise unchanged (series 2, image 78, 104). Reproductive: Status post hysterectomy. Other: Left lower quadrant end colostomy with a small parastomal hernia. Large abdominal pannus with skin thickening and nodularity of both the external surface and underlying fold, previously FDG avid (series 2, image 106). No ascites. Musculoskeletal: No acute osseous findings. Numerous sclerotic osseous metastases involving the axial and proximal appendicular skeleton, for example of the proximal right humerus, right clavicular  head and left ilium CT appearance not significantly changed. IMPRESSION: 1. Status post abdominoperineal resection with left lower quadrant end colostomy. 2. New, eccentric, nodular wall thickening of the colon at the hepatic flexure measuring 2.3 x 1.4 cm. This may be transient and peristaltic however a metastatic peritoneal implant or metachronous colon malignancy not excluded. Attention on follow-up. 3. Otherwise no significant change in previously FDG avid widespread pulmonary, nodal, and osseous metastatic disease, as well as soft tissue involvement of the right lower quadrant  abdominal wall and pannus. 4. Coronary artery disease. Aortic Atherosclerosis (ICD10-I70.0). Electronically Signed   By: Marolyn JONETTA Jaksch M.D.   On: 04/16/2024 06:45   MM 3D SCREENING MAMMOGRAM BILATERAL BREAST Result Date: 01/25/2024 CLINICAL DATA:  Screening. EXAM: DIGITAL SCREENING BILATERAL MAMMOGRAM WITH TOMOSYNTHESIS AND CAD TECHNIQUE: Bilateral screening digital craniocaudal and mediolateral oblique mammograms were obtained. Bilateral screening digital breast tomosynthesis was performed. The images were evaluated with computer-aided detection. COMPARISON:  Previous exam(s). ACR Breast Density Category b: There are scattered areas of fibroglandular density. FINDINGS: There are no findings suspicious for malignancy. IMPRESSION: No mammographic evidence of malignancy. A result letter of this screening mammogram will be mailed directly to the patient. RECOMMENDATION: Screening mammogram in one year. (Code:SM-B-01Y) BI-RADS CATEGORY  1: Negative. Electronically Signed   By: Dina  Arceo M.D.   On: 01/25/2024 07:45      CT CHEST ABDOMEN PELVIS W CONTRAST Result Date: 04/16/2024 CLINICAL DATA:  Metastatic rectal cancer restaging * Tracking Code: BO * EXAM: CT CHEST, ABDOMEN, AND PELVIS WITH CONTRAST TECHNIQUE: Multidetector CT imaging of the chest, abdomen and pelvis was performed following the standard protocol during bolus administration of intravenous contrast. RADIATION DOSE REDUCTION: This exam was performed according to the departmental dose-optimization program which includes automated exposure control, adjustment of the mA and/or kV according to patient size and/or use of iterative reconstruction technique. CONTRAST:  85mL OMNIPAQUE  IOHEXOL  300 MG/ML  SOLN COMPARISON:  PET-CT, 01/14/2024 FINDINGS: CT CHEST FINDINGS Cardiovascular: Aortic atherosclerosis. Normal heart size. Left coronary artery calcifications. No pericardial effusion. Mediastinum/Nodes: No significant change in enlarged lower left  cervical lymph node measuring 2.1 x 1.3 cm, previously FDG avid (series 2, image 6). Thyroid  gland, trachea, and esophagus demonstrate no significant findings. Lungs/Pleura: Numerous bilateral pulmonary nodules not significantly changed, largest index nodule of the medial segment right middle lobe measuring 1.5 x 1.4 cm (series 3, image 99), additional index nodule in the peripheral left upper lobe measuring 0.7 cm (series 3, image 40). No pleural effusion or pneumothorax. Musculoskeletal: No chest wall abnormality. No acute osseous findings. CT ABDOMEN PELVIS FINDINGS Hepatobiliary: No focal liver abnormality is seen. Status post cholecystectomy. No biliary dilatation. Pancreas: Unremarkable. No pancreatic ductal dilatation or surrounding inflammatory changes. Spleen: Normal in size without significant abnormality. Adrenals/Urinary Tract: Adrenal glands are unremarkable. Multiple benign bilateral renal cortical cysts, for which no further follow-up or characterization is required. Kidneys are otherwise normal, without renal calculi, solid lesion, or hydronephrosis. Bladder is unremarkable. Stomach/Bowel: Stomach is within normal limits. Normal appendix. Status post abdominoperineal resection with left lower quadrant end colostomy. New, eccentric, nodular wall thickening of the colon at the hepatic flexure measuring 2.3 x 1.4 cm (series 2, image 69). Vascular/Lymphatic: Aortic atherosclerosis. Similar appearance of numerous matted, previously FDG avid retroperitoneal and pelvic lymph nodes, index left retroperitoneal node or conglomerate measuring 2.0 x 1.4 cm (series 2, image 66). Unchanged index right external iliac node measures 1.8 x 1.4 cm with matted soft tissue about the right common femoral  artery likewise unchanged (series 2, image 78, 104). Reproductive: Status post hysterectomy. Other: Left lower quadrant end colostomy with a small parastomal hernia. Large abdominal pannus with skin thickening and  nodularity of both the external surface and underlying fold, previously FDG avid (series 2, image 106). No ascites. Musculoskeletal: No acute osseous findings. Numerous sclerotic osseous metastases involving the axial and proximal appendicular skeleton, for example of the proximal right humerus, right clavicular head and left ilium CT appearance not significantly changed. IMPRESSION: 1. Status post abdominoperineal resection with left lower quadrant end colostomy. 2. New, eccentric, nodular wall thickening of the colon at the hepatic flexure measuring 2.3 x 1.4 cm. This may be transient and peristaltic however a metastatic peritoneal implant or metachronous colon malignancy not excluded. Attention on follow-up. 3. Otherwise no significant change in previously FDG avid widespread pulmonary, nodal, and osseous metastatic disease, as well as soft tissue involvement of the right lower quadrant abdominal wall and pannus. 4. Coronary artery disease. Aortic Atherosclerosis (ICD10-I70.0). Electronically Signed   By: Marolyn JONETTA Jaksch M.D.   On: 04/16/2024 06:45   MM 3D SCREENING MAMMOGRAM BILATERAL BREAST Result Date: 01/25/2024 CLINICAL DATA:  Screening. EXAM: DIGITAL SCREENING BILATERAL MAMMOGRAM WITH TOMOSYNTHESIS AND CAD TECHNIQUE: Bilateral screening digital craniocaudal and mediolateral oblique mammograms were obtained. Bilateral screening digital breast tomosynthesis was performed. The images were evaluated with computer-aided detection. COMPARISON:  Previous exam(s). ACR Breast Density Category b: There are scattered areas of fibroglandular density. FINDINGS: There are no findings suspicious for malignancy. IMPRESSION: No mammographic evidence of malignancy. A result letter of this screening mammogram will be mailed directly to the patient. RECOMMENDATION: Screening mammogram in one year. (Code:[id number]) BI-RADS CATEGORY  1: Negative. Electronically Signed   By: Dina  Arceo M.D.   On: 01/25/2024 07:45

## 2024-04-17 NOTE — Assessment & Plan Note (Signed)
 Chemotherapy plan as listed above

## 2024-04-23 ENCOUNTER — Other Ambulatory Visit: Payer: Self-pay | Admitting: Family Medicine

## 2024-04-23 DIAGNOSIS — I129 Hypertensive chronic kidney disease with stage 1 through stage 4 chronic kidney disease, or unspecified chronic kidney disease: Secondary | ICD-10-CM

## 2024-04-25 NOTE — Telephone Encounter (Signed)
 Requested Prescriptions  Pending Prescriptions Disp Refills   triamterene -hydrochlorothiazide  (DYAZIDE ) 37.5-25 MG capsule [Pharmacy Med Name: Triamterene -HCTZ 37.5-25 MG Oral Capsule] 90 capsule 1    Sig: TAKE 1 CAPSULE BY MOUTH EACH  DAILY     Cardiovascular: Diuretic Combos Passed - 04/25/2024  2:00 PM      Passed - K in normal range and within 180 days    Potassium  Date Value Ref Range Status  04/17/2024 3.5 3.5 - 5.1 mmol/L Final         Passed - Na in normal range and within 180 days    Sodium  Date Value Ref Range Status  04/17/2024 136 135 - 145 mmol/L Final         Passed - Cr in normal range and within 180 days    Creatinine  Date Value Ref Range Status  04/17/2024 0.73 0.44 - 1.00 mg/dL Final   Creat  Date Value Ref Range Status  06/24/2021 1.10 (H) 0.60 - 1.00 mg/dL Final         Passed - Last BP in normal range    BP Readings from Last 1 Encounters:  04/17/24 134/60         Passed - Valid encounter within last 6 months    Recent Outpatient Visits           3 months ago Metastatic adenocarcinoma [facility])   [place] [facility] [place] PARAS, DO   4 months ago Chronic right shoulder pain   [place] [facility] [place], [place] W, NP   4 months ago Benign hypertension with CKD (chronic kidney disease), stage II   [place] [facility] [place], Marsa PARAS, DO   5 months ago Chronic right shoulder pain   [place] [facility] [place], Angeline ORN, [place]

## 2024-04-30 ENCOUNTER — Encounter: Admitting: Physician Assistant

## 2024-04-30 DIAGNOSIS — Z7901 Long term (current) use of anticoagulants: Secondary | ICD-10-CM | POA: Diagnosis not present

## 2024-04-30 DIAGNOSIS — C4499 Other specified malignant neoplasm of skin, unspecified: Secondary | ICD-10-CM | POA: Diagnosis not present

## 2024-04-30 DIAGNOSIS — C3412 Malignant neoplasm of upper lobe, left bronchus or lung: Secondary | ICD-10-CM | POA: Diagnosis not present

## 2024-04-30 DIAGNOSIS — Z85048 Personal history of other malignant neoplasm of rectum, rectosigmoid junction, and anus: Secondary | ICD-10-CM | POA: Diagnosis not present

## 2024-04-30 DIAGNOSIS — Z86718 Personal history of other venous thrombosis and embolism: Secondary | ICD-10-CM | POA: Diagnosis not present

## 2024-04-30 DIAGNOSIS — L98492 Non-pressure chronic ulcer of skin of other sites with fat layer exposed: Secondary | ICD-10-CM | POA: Diagnosis not present

## 2024-05-01 ENCOUNTER — Encounter: Payer: Self-pay | Admitting: Oncology

## 2024-05-01 ENCOUNTER — Inpatient Hospital Stay (HOSPITAL_BASED_OUTPATIENT_CLINIC_OR_DEPARTMENT_OTHER): Admitting: Oncology

## 2024-05-01 ENCOUNTER — Inpatient Hospital Stay

## 2024-05-01 VITALS — BP 113/61 | HR 98 | Temp 97.3°F | Resp 19 | Wt 154.6 lb

## 2024-05-01 VITALS — BP 128/54 | HR 79 | Temp 96.1°F | Resp 18

## 2024-05-01 DIAGNOSIS — Z5112 Encounter for antineoplastic immunotherapy: Secondary | ICD-10-CM | POA: Diagnosis not present

## 2024-05-01 DIAGNOSIS — C799 Secondary malignant neoplasm of unspecified site: Secondary | ICD-10-CM

## 2024-05-01 DIAGNOSIS — E7800 Pure hypercholesterolemia, unspecified: Secondary | ICD-10-CM | POA: Diagnosis not present

## 2024-05-01 DIAGNOSIS — D702 Other drug-induced agranulocytosis: Secondary | ICD-10-CM | POA: Diagnosis not present

## 2024-05-01 DIAGNOSIS — C7951 Secondary malignant neoplasm of bone: Secondary | ICD-10-CM | POA: Diagnosis not present

## 2024-05-01 DIAGNOSIS — C20 Malignant neoplasm of rectum: Secondary | ICD-10-CM

## 2024-05-01 DIAGNOSIS — Z933 Colostomy status: Secondary | ICD-10-CM | POA: Diagnosis not present

## 2024-05-01 DIAGNOSIS — I10 Essential (primary) hypertension: Secondary | ICD-10-CM | POA: Diagnosis not present

## 2024-05-01 DIAGNOSIS — R188 Other ascites: Secondary | ICD-10-CM | POA: Diagnosis not present

## 2024-05-01 DIAGNOSIS — C7801 Secondary malignant neoplasm of right lung: Secondary | ICD-10-CM | POA: Diagnosis not present

## 2024-05-01 DIAGNOSIS — I700 Atherosclerosis of aorta: Secondary | ICD-10-CM | POA: Diagnosis not present

## 2024-05-01 DIAGNOSIS — D6481 Anemia due to antineoplastic chemotherapy: Secondary | ICD-10-CM | POA: Diagnosis not present

## 2024-05-01 DIAGNOSIS — T451X5A Adverse effect of antineoplastic and immunosuppressive drugs, initial encounter: Secondary | ICD-10-CM | POA: Diagnosis not present

## 2024-05-01 DIAGNOSIS — K435 Parastomal hernia without obstruction or  gangrene: Secondary | ICD-10-CM | POA: Diagnosis not present

## 2024-05-01 DIAGNOSIS — K56699 Other intestinal obstruction unspecified as to partial versus complete obstruction: Secondary | ICD-10-CM | POA: Diagnosis not present

## 2024-05-01 DIAGNOSIS — N281 Cyst of kidney, acquired: Secondary | ICD-10-CM | POA: Diagnosis not present

## 2024-05-01 DIAGNOSIS — Z5189 Encounter for other specified aftercare: Secondary | ICD-10-CM | POA: Diagnosis not present

## 2024-05-01 DIAGNOSIS — Z7901 Long term (current) use of anticoagulants: Secondary | ICD-10-CM | POA: Diagnosis not present

## 2024-05-01 DIAGNOSIS — C792 Secondary malignant neoplasm of skin: Secondary | ICD-10-CM | POA: Diagnosis not present

## 2024-05-01 DIAGNOSIS — I2510 Atherosclerotic heart disease of native coronary artery without angina pectoris: Secondary | ICD-10-CM | POA: Diagnosis not present

## 2024-05-01 DIAGNOSIS — Z87891 Personal history of nicotine dependence: Secondary | ICD-10-CM | POA: Diagnosis not present

## 2024-05-01 DIAGNOSIS — J90 Pleural effusion, not elsewhere classified: Secondary | ICD-10-CM | POA: Diagnosis not present

## 2024-05-01 DIAGNOSIS — Z86718 Personal history of other venous thrombosis and embolism: Secondary | ICD-10-CM | POA: Diagnosis not present

## 2024-05-01 DIAGNOSIS — G620 Drug-induced polyneuropathy: Secondary | ICD-10-CM | POA: Diagnosis not present

## 2024-05-01 DIAGNOSIS — C7802 Secondary malignant neoplasm of left lung: Secondary | ICD-10-CM | POA: Diagnosis not present

## 2024-05-01 LAB — CBC WITH DIFFERENTIAL (CANCER CENTER ONLY)
Abs Immature Granulocytes: 0.01 K/uL (ref 0.00–0.07)
Basophils Absolute: 0 K/uL (ref 0.0–0.1)
Basophils Relative: 0 %
Eosinophils Absolute: 0 K/uL (ref 0.0–0.5)
Eosinophils Relative: 1 %
HCT: 32.8 % — ABNORMAL LOW (ref 36.0–46.0)
Hemoglobin: 10.6 g/dL — ABNORMAL LOW (ref 12.0–15.0)
Immature Granulocytes: 0 %
Lymphocytes Relative: 18 %
Lymphs Abs: 0.8 K/uL (ref 0.7–4.0)
MCH: 32.9 pg (ref 26.0–34.0)
MCHC: 32.3 g/dL (ref 30.0–36.0)
MCV: 101.9 fL — ABNORMAL HIGH (ref 80.0–100.0)
Monocytes Absolute: 0.2 K/uL (ref 0.1–1.0)
Monocytes Relative: 5 %
Neutro Abs: 3.4 K/uL (ref 1.7–7.7)
Neutrophils Relative %: 76 %
Platelet Count: 223 K/uL (ref 150–400)
RBC: 3.22 MIL/uL — ABNORMAL LOW (ref 3.87–5.11)
RDW: 16.4 % — ABNORMAL HIGH (ref 11.5–15.5)
WBC Count: 4.4 K/uL (ref 4.0–10.5)
nRBC: 0 % (ref 0.0–0.2)

## 2024-05-01 LAB — CMP (CANCER CENTER ONLY)
ALT: 22 U/L (ref 0–44)
AST: 34 U/L (ref 15–41)
Albumin: 4 g/dL (ref 3.5–5.0)
Alkaline Phosphatase: 67 U/L (ref 38–126)
Anion gap: 8 (ref 5–15)
BUN: 23 mg/dL (ref 8–23)
CO2: 24 mmol/L (ref 22–32)
Calcium: 9.3 mg/dL (ref 8.9–10.3)
Chloride: 105 mmol/L (ref 98–111)
Creatinine: 0.75 mg/dL (ref 0.44–1.00)
GFR, Estimated: >60 mL/min (ref >60)
Glucose, Bld: 128 mg/dL — ABNORMAL HIGH (ref 70–99)
Potassium: 3.6 mmol/L (ref 3.5–5.1)
Sodium: 137 mmol/L (ref 135–145)
Total Bilirubin: 0.7 mg/dL (ref 0.0–1.2)
Total Protein: 7.1 g/dL (ref 6.5–8.1)

## 2024-05-01 MED ORDER — SODIUM CHLORIDE 0.9 % IV SOLN
5.0000 mg/kg | Freq: Once | INTRAVENOUS | Status: AC
  Administered 2024-05-01: 350 mg via INTRAVENOUS
  Filled 2024-05-01: qty 14

## 2024-05-01 MED ORDER — HEPARIN SOD (PORK) LOCK FLUSH 100 UNIT/ML IV SOLN
500.0000 [IU] | Freq: Once | INTRAVENOUS | Status: AC | PRN
Start: 2024-05-01 — End: 2024-05-01
  Administered 2024-05-01: 500 [IU]
  Filled 2024-05-01: qty 5

## 2024-05-01 MED ORDER — PEGFILGRASTIM INJECTION 6 MG/0.6ML ~~LOC~~
6.0000 mg | PREFILLED_SYRINGE | Freq: Once | SUBCUTANEOUS | Status: AC
  Administered 2024-05-01: 6 mg via SUBCUTANEOUS
  Filled 2024-05-01: qty 0.6

## 2024-05-01 MED ORDER — SODIUM CHLORIDE 0.9 % IV SOLN
INTRAVENOUS | Status: DC
  Filled 2024-05-01: qty 250

## 2024-05-01 NOTE — Patient Instructions (Signed)
 [facility] - A DEPT OF [place]  Discharge Instructions: Thank you for choosing [facility] to provide your oncology and hematology care.  If you have a lab appointment with the Cancer Center, please go directly to the Cancer Center and check in at the registration area.  Wear comfortable clothing and clothing appropriate for easy access to any Portacath or PICC line.   We strive to give you quality time with your provider. You may need to reschedule your appointment if you arrive late (15 or more minutes).  Arriving late affects you and other patients whose appointments are after yours.  Also, if you miss three or more appointments without notifying the office, you may be dismissed from the clinic at the provider's discretion.      For prescription refill requests, have your pharmacy contact our office and allow 72 hours for refills to be completed.    Today you received the following chemotherapy and/or immunotherapy agents mvasi       To help prevent nausea and vomiting after your treatment, we encourage you to take your nausea medication as directed.  BELOW ARE SYMPTOMS THAT SHOULD BE REPORTED IMMEDIATELY: *FEVER GREATER THAN 100.4 F (38 C) OR HIGHER *CHILLS OR SWEATING *NAUSEA AND VOMITING THAT IS NOT CONTROLLED WITH YOUR NAUSEA MEDICATION *UNUSUAL SHORTNESS OF BREATH *UNUSUAL BRUISING OR BLEEDING *URINARY PROBLEMS (pain or burning when urinating, or frequent urination) *BOWEL PROBLEMS (unusual diarrhea, constipation, pain near the anus) TENDERNESS IN MOUTH AND THROAT WITH OR WITHOUT PRESENCE OF ULCERS (sore throat, sores in mouth, or a toothache) UNUSUAL RASH, SWELLING OR PAIN  UNUSUAL VAGINAL DISCHARGE OR ITCHING   Items with * indicate a potential emergency and should be followed up as soon as possible or go to the Emergency Department if any problems should occur.  Please show the CHEMOTHERAPY ALERT CARD or IMMUNOTHERAPY ALERT  CARD at check-in to the Emergency Department and triage nurse.  Should you have questions after your visit or need to cancel or reschedule your appointment, please contact [facility] - A DEPT OF [facility]  [phone] and follow the prompts.  Office hours are 8:00 a.m. to 4:30 p.m. Monday - Friday. Please note that voicemails left after 4:00 p.m. may not be returned until the following business day.  We are closed weekends and major holidays. You have access to a nurse at all times for urgent questions. Please call the main number to the clinic [phone] and follow the prompts.  For any non-urgent questions, you may also contact your provider using MyChart. We now offer e-Visits for anyone 55 and older to request care online for non-urgent symptoms. For details visit [email or web address].   Also download the MyChart app! Go to the app store, search MyChart, open the app, select [place], and log in with your MyChart username and password.

## 2024-05-01 NOTE — Progress Notes (Signed)
 Hematology/Oncology Progress note [phone] [phone]      CHIEF COMPLAINTS/REASON FOR VISIT:  Follow up for rectal cancer  ASSESSMENT & PLAN:   Cancer Staging  Rectal cancer [facility]) Staging form: Colon and Rectum, AJCC 8th Edition - Pathologic stage from 10/06/2019: Stage IIC (ypT4b, ypN0, cM0) - Signed by Babara Call, MD on 10/06/2019 - Clinical stage from 06/30/2020: [name] - Signed by Babara Call, MD on 04/17/2022 - Pathologic: Stage Unknown (rpTX, rpNX, rcM1) - Signed by Babara Call, MD on 01/31/2021   Rectal cancer [facility]) History of stage IIIC Rectal cancer, s/p TNT, followed by 09/17/19 APR/posterior vaginectomy/TAH/BSO/VY-flap, pT4b pN0 with close vaginal margin 0.2 mm.  Uterus and ovaries negative for malignancy. palliative radiation to vaginal recurrence- 01/19/21 recurrence with lung metastasis.-Palliative -FOLFIRI plus bevacizumab .  Irinotecan  was dropped in November 2022 due to side effects. Negative for UGT1A1*28 - radiographically stable, rise of CEA-July 2023 CT lung metastasis worse--> Dec 2023 PET showed progression in pelvic lymph nodes and bone lesions,--> 2nd line irinotecan  +panitumumab -->CT March 2024 stable. --> June 2024 CT showed mild lung progression/Progressive left iliac bone metastasis, vulvar mass difficult for biopsy in office,  Tempus Liquid biopsy - no actionable variants --> 06/25/2023 CT showed progression of lung nodules [SLD increase 42mm] and iliac inguinal lymphadenopathy--> 07/23/23 switch to dose reduced FOLFOX --> Dec 2024 CT- stable disease. --> Feb 2025 disease progression--> longsurf + Bevacizumab   Labs are reviewed and discussed with patient   CEA nadir was in 490, progressively increases - 10,193  Currently on longsurf [60mg  BID day 1-5 and day 8- 12 every 28-day  + Bevacizumab  D1 proceed with D15 Bevacizumab  Proceed with cycle 5  D15  Bevacizumab  CT findings are discussed with patient and daughter.    Follow up 2 week[s].   All questions were answered. The patient knows to call the clinic with any problems, questions or concerns.  Call Babara, MD, PhD [facility] Hematology Oncology 05/01/2024      HISTORY OF PRESENTING ILLNESS:  Oncology History  Rectal cancer (HCC)  01/23/2019 Initial Diagnosis   Rectal cancer [facility]) Patient initially presented with complaints of postmenopausal bleeding on 08/16/2018.  History of was menopausal vaginal bleeding in 2016 which resulted in cervical polypectomy.  Pathology 02/04/2015 showed cervical polyp, consistent with benign endometrial polyp.  [name] after polypectomy due to anxiety associated with pelvic exams.  pelvic exam on 08/16/2018 reviewed cervical abnormality and from enlarged uterus. Seen by Dr. Connell on 10/29/2018.  Endometrial biopsy and a Pap smear was performed. 10/29/2018 Pap smear showed adenocarcinoma, favor endometrial origin. 10/29/2018 endometrial biopsy showed endometrioid carcinoma, FIGO grade 1.  10/29/2018- TA & TV Ultrasound revealed: Anteverted uterus measuring 8.7 x 5.6 x 6.4 cm without evidence of focal masses.  The endometrium measuring 24.1 mm (thickened) and heterogeneous.  Right and left ovaries not visualized.  No adnexal masses identified.  No free fluid in cul-de-sac.  Patient was seen by Dr. Elby in clinic on 11/13/2018.  Cervical exam reveals 2 cm exophytic irregular mass consistent with malignancy.    11/19/2018 CT chest abdomen pelvis with contrast showed thickened endometrium with some irregularity compatible with the provided diagnosis of endometrial malignancy.  There is a mildly prominent left inguinal node 1.4 cm.  Patient was seen by Dr. Mancil on 11/20/2018 and left groin lymph node biopsy was recommended.  11/26/2018 patient underwent left inguinal lymph node biopsy. Pathology showed metastatic adenocarcinoma consistent with colorectal origin.  CDX 2+.  Case was discussed  on tumor board.  Recommend colonoscopy for  further evaluation.  Patient reports significant weight loss 30 pounds over the last year.  Chronic vaginal spotting. Change of bowel habits the past few months.  More constipated.  Family history positive for brother who has colon cancer prostate cancer.  patient has underwent colonoscopy on 12/03/2018 which reviewed a nonobstructing large mass in the rectum.  Also chronic fistula.  Mass was not circumferential.  This was biopsied with a cold forceps for histology.  Pathology came back hyperplastic polyp negative for dysplasia and malignancy. [name], patient underwent flex sigmoidoscopy on 12/06/2018 with rebiopsy of the rectal mass. This time biopsy results came back positive for invasive colorectal adenocarcinoma, moderately differentiated. Immunotherapy for nearly mismatch repair protein (MMR ) was performed.  There is no loss of MMR expression.  low probability of MSI high.   # Seen by [name] for evaluation of resectability for rectal cancer. In addition, she also had a second opinion with [facility] where her endometrial biopsy pathology was changed to  adenocarcinoma, consistent with colorectal primary.   [name]. She has home health that has been assisting with ostomy care  Patient was also evaluated by [facility] oncology.  Recommendation is to proceed with TNT with concurrent chemoradiation followed by neoadjuvant chemotherapy followed by surgical resection. Patient prefers to have treatment done locally with [facility].   # Oncology Treatment:  02/03/2019- 03/19/2019  concurrent Xeloda  and radiation.  Xeloda  dose 825mg  /m2 BID - rounded to 1650mg  BID- on days of radiation. 04/09/2019, started on FOLFOX with bolus early.  Omitted.  07/16/2019 finished 8 cycles of FOLFOX. 09/17/19 APR/posterior vaginectomy/TAH/BSO/VY-flap pT4b pN0 with close vaginal margin 0.2 mm.  Uterus and ovaries negative for malignancy.   10/06/2019 Cancer  Staging   Staging form: Colon and Rectum, AJCC 8th Edition - Pathologic stage from 10/06/2019: Stage IIC (ypT4b, pN0, cM0) - Signed by Babara Call, MD on 10/06/2019   06/30/2020 Cancer Staging   Staging form: Colon and Rectum, AJCC 8th Edition - Clinical stage from 06/30/2020: [name] - Signed by Babara Call, MD on 04/17/2022 Stage prefix: Recurrence Total positive nodes: 1   06/30/2020 Relapse/Recurrence   vaginal introitus mass biopsied. Pathology is consistent with metastatic colorectal adenocarcinoma    07/20/2020 - 09/13/2020 Chemotherapy   Concurrent chemotherapy with Xeloda  with Radiation.  08/02/2020-08/16/2020, . Xeloda  was held due to neutropenia    08/17/2020 - 09/06/2020 Radiation Therapy   Xeloda  1500 mg twice daily concurrently with radiation    01/31/2021 Cancer Staging   Staging form: Colon and Rectum, AJCC 8th Edition - Pathologic: Stage Unknown (rpTX, pNX, cM1) - Signed by Babara Call, MD on 01/31/2021 Stage prefix: Recurrence   01/31/2021 - 08/20/2021 Chemotherapy   FOLFIRI + Bev  Irinotecan  dose was reduced, eventually 100mg /m2  Irinotecan  was dropped in November 2022 due to side effects. Negative for UGT1A1*28    09/13/2021 -  Chemotherapy   maintenance 5-FU/bevacizumab     11/28/2021 -  Chemotherapy   5-FU/Irinotecan /bevacizumab . Irinotecan  100 mg/m2 was added back due to progressively increasing CEA.    07/14/2022 Imaging   Bone scan  1. No evidence of skeletal metastatic disease in the hips. 2. Asymmetric increased activity in the RIGHT humeral head is favored degenerative as described above. If pain in the RIGHT shoulder consider further evaluation with plain film or MRI   07/17/2022 Imaging   1. Stable scattered small bilateral pulmonary nodules, largest 8 by 6  mm in the right middle lobe. 2. Subtle chronically stable posterior cortical irregularity and heterogeneity in the left iliac bone, not appreciable on the bone scan of 07/14/2022. This has been present at  least over the past year and could reflect radiation therapy related findings although strictly speaking, malignant involvement of the left iliac bone is difficult to exclude. If clinically warranted this could be further assessed with nuclear medicine PET-CT or MRI of the bony pelvis with and without contrast. 3. Pelvic floor laxity with loops of small bowel extending 3 cm below the pubococcygeal line. 4. Aortic and systemic atherosclerosis as detailed above. Aortic Atherosclerosis    12/26/2022 Imaging   CT chest abdomen pelvis w contrast 1. Stable scattered solid pulmonary nodules. 2. Right inguinal and right pelvic lymph nodes which were hypermetabolic on prior PET-CT are stable. 3. Osseous lesion of the proximal right humerus demonstrates increased sclerosis when compared with the prior PET-CT, possibly due to treatment response. 4. 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 5. Aortic Atherosclerosis     06/25/2023 Imaging   CT chest abdomen pelvis w contrast showed 1. Increased size of the solid bilateral pulmonary nodules,consistent with worsening metastatic disease. 2. Increased size of the right external iliac and inguinal lymph nodes, concerning for worsening metastatic disease. 3. No significant interval change in the left iliac bone osseous metastasis. 4. Enlarged main pulmonary artery measuring 3.6 cm, which can be seen in the setting of pulmonary arterial hypertension. 5.  Aortic Atherosclerosis (ICD10-I70.0)     Miscellaneous   Tempus liquid biopsy showed no actionable variants.  NOTCH1 missense variant, BRCA2 missense variant , MYC missense variant   07/11/2023 Imaging   PET showed 1. Multifocal tracer avid abdominopelvic lymph nodes are identified compatible with metastatic disease. 2. Multifocal bilateral pulmonary nodules are identified compatible with pulmonary metastasis.  3. Diffuse increased radiotracer uptake throughout the marrow space is identified  diminishing sensitivity for detecting osseous metastasis. No abnormal increased radiotracer uptake identified above background increased bone marrow activity to indicate metabolically active bone metastases. 4.  Aortic Atherosclerosis   07/23/2023 - 11/13/2023 Chemotherapy   switch to dose reduced FOLFOX    09/16/2023 Imaging   CT abdomen pelvis w contrast  1. Small-bowel obstruction with transition point in the anterior aspect of the lower abdomen, left of midline. This may be secondary to adhesions. 2. Small volume ascites. 3. Prior colon resection with left anterior abdominal wall colostomy. Adjacent parastomal hernia. Small volume fluid within the hernia sac. 4. Stable pulmonary metastatic disease. 5. Stable metastatic lesion of the left iliac wing. 6. Enlarged right external iliac chain lymph node measuring 1.4 cm, previously 1.3 cm. Several mildly prominent retroperitoneal lymph nodes have increased in size from prior but remain subcentimeter in size. 7. Aortic atherosclerosis (ICD10-I70.0).   09/16/2023 - 09/20/2023 Hospital Admission   Hospitalized due to Small bowel obstruction due to adhesions    09/24/2023 Imaging   1. Bilateral pulmonary metastases are all stable from 06/25/2023 chest CT. 2. No thoracic adenopathy. No new or progressive metastatic disease in the chest. 3. Trace bilateral posterior pleural effusions. 4. Two-vessel coronary atherosclerosis. 5. Dilated main pulmonary artery, suggesting pulmonary arterial hypertension. 6.  Aortic Atherosclerosis (ICD10-I70.0).   11/28/2023 Imaging   CT chest abdomen pelvis with contrast 1. Increased size of retroperitoneal, iliac side chain and inguinal lymph nodes, concerning for progressive nodal metastatic disease. 2. Numerous bilateral solid pulmonary nodules are again seen, some of which have increased in size and others are  stable in size overall compatible with disease progression. 3. Increased size of the  subcapsular lesion in the anterior inferior right lobe of the liver, which has been stable over multiple prior examinations and was not FDG avid on prior PET-CT. Suggest attention on follow-up imaging. 4. 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 5. Questionable wall thickening of matted loops of small bowel in the left lower quadrant, suggest attention on follow-up imaging. 6. Small volume abdominopelvic free fluid. 7.  Aortic Atherosclerosis (ICD10-I70.0).   12/13/2023 -  Chemotherapy   Patient is on Treatment Plan : COLORECTAL Bevacizumab  + Trifluridine /Tipiracil  q28d     01/01/2024 Procedure   Right inguinal crease skin nodule biopsy  Pathology showed dermal deposits of metastatic colon adenocarcinoma, extending to the edge and base.      01/14/2024 Imaging   PET scan showed  1. Progressive disease with progressive left supraclavicular adenopathy, progressive pulmonary metastatic disease, progressive abdominal and pelvic adenopathy, and progressive multifocal skeletal involvement. 2. Substantial multifocal skeletal involvement including the right proximal humerus, right medial clavicle, left T11 vertebral body, L4 posterior elements, and entire left iliac crest. The permeative lesion of the right humerus may have some extraosseous extension. Similarly there is cortical breakthrough in the large left iliac crest lesion. 3. Hypermetabolic irregular tumor extends along the right inguinal ring region and right hip adductor musculature as well as in the subcutaneous and cutaneous tissues along the right inguinal fold, compatible with tumor involvement. 4. Right eccentric vulvar activity just below the clitoris has a maximum SUV of 7.5, and given the regional soft tissue metastatic disease, a metastatic focus in this vicinity is not excluded. 5. The subcapsular lesion along the anterior inferior right hepatic lobe described on CT of  11/28/2023 is not discernibly hypermetabolic or visible on today's PET-CT. 6. Descending left colostomy with mild diverticulosis. 7.  Aortic Atherosclerosis (ICD10-I70.0).    04/10/2024 Imaging   CT chest abdomen pelvis w contrast showed 1. Status post abdominoperineal resection with left lower quadrant end colostomy. 2. New, eccentric, nodular wall thickening of the colon at the hepatic flexure measuring 2.3 x 1.4 cm. This may be transient and peristaltic however a metastatic peritoneal implant or metachronous colon malignancy not excluded. Attention on follow-up. 3. Otherwise no significant change in previously FDG avid widespread pulmonary, nodal, and osseous metastatic disease, as well as soft tissue involvement of the right lower quadrant abdominal wall and pannus. 4. Coronary artery disease.     Metastatic adenocarcinoma (HCC)  12/13/2023 -  Chemotherapy   Patient is on Treatment Plan : COLORECTAL Bevacizumab  + Trifluridine /Tipiracil  q28d         INTERVAL HISTORY Jacqueline Yoder is a 78 y.o. female who has above history reviewed by me presents for follow-up of rectal cancer. She was accompanied by her daughter. Appetite is not good + manageable neuropathy symptoms of her fingertips, intermittent,  worse when exposed to cold temperature.  + chronic loose ostomy output, stable, not worse.  + right groin skin mets, open wound, follows up with wound care.  She has not needed to take pain meds. Weight is stable. No fever or chills.   Review of Systems  Constitutional:  Positive for fatigue. Negative for appetite change, chills, fever and unexpected weight change.  HENT:   Negative for hearing loss and voice change.   Eyes:  Negative for eye problems.  Respiratory:  Negative for chest tightness and cough.   Cardiovascular:  Negative for chest  pain.  Gastrointestinal:  Negative for abdominal distention, abdominal pain, blood in stool, constipation, diarrhea and nausea.   Endocrine: Negative for hot flashes.  Genitourinary:  Negative for difficulty urinating and frequency.   Musculoskeletal:  Positive for arthralgias.        right shoulder pain.   Skin:  Positive for rash. Negative for itching.  Neurological:  Positive for numbness. Negative for extremity weakness.  Hematological:  Negative for adenopathy.  Psychiatric/Behavioral:  Negative for confusion.     MEDICAL HISTORY:  Past Medical History:  Diagnosis Date   Allergy    Arthritis    Blood clot in vein    Family history of colon cancer    GERD (gastroesophageal reflux disease)    Hypercholesteremia    Hypertension    Hypertension    Lower extremity edema    Personal history of chemotherapy    Rectal cancer (HCC) 12/2018   Urinary incontinence     SURGICAL HISTORY: Past Surgical History:  Procedure Laterality Date   ABDOMINAL HYSTERECTOMY     CHOLECYSTECTOMY  1971   COLONOSCOPY WITH PROPOFOL  N/A 12/03/2018   Procedure: COLONOSCOPY WITH PROPOFOL ;  Surgeon: Jinny Carmine, MD;  Location: [facility] ENDOSCOPY;  Service: Endoscopy;  Laterality: N/A;   COLONOSCOPY WITH PROPOFOL  N/A 07/15/2020   Procedure: COLONOSCOPY WITH PROPOFOL ;  Surgeon: Therisa Bi, MD;  Location: [facility] ENDOSCOPY;  Service: Gastroenterology;  Laterality: N/A;   FLEXIBLE SIGMOIDOSCOPY N/A 12/06/2018   Procedure: FLEXIBLE SIGMOIDOSCOPY;  Surgeon: Therisa Bi, MD;  Location: [facility] ENDOSCOPY;  Service: Endoscopy;  Laterality: N/A;   LAPAROSCOPIC COLOSTOMY  01/06/2019   PORTACATH PLACEMENT N/A 04/03/2019   Procedure: INSERTION PORT-A-CATH;  Surgeon: Jordis Laneta FALCON, MD;  Location: [facility] ORS;  Service: General;  Laterality: N/A;    SOCIAL HISTORY: Social History   Socioeconomic History   Marital status: Widowed    Spouse name: Not on file   Number of children: Not on file   Years of education: Not on file   Highest education level: Some college, no degree  Occupational History   Not on file  Tobacco Use   Smoking status: Former     Current packs/day: 0.00    Types: Cigarettes    Quit date: 12/02/1977    Years since quitting: 46.4   Smokeless tobacco: Former  Building services engineer status: Never Used  Substance and Sexual Activity   Alcohol use: Never   Drug use: Never   Sexual activity: Not Currently    Birth control/protection: None  Other Topics Concern   Not on file  Social History Narrative   Lives with daughter   Social Drivers of Health   Financial Resource Strain: Low Risk  (11/15/2023)   Overall Financial Resource Strain (CARDIA)    Difficulty of Paying Living Expenses: Not hard at all  Food Insecurity: No Food Insecurity (11/15/2023)   Hunger Vital Sign    Worried About Running Out of Food in the Last Year: Never true    Ran Out of Food in the Last Year: Never true  Transportation Needs: No Transportation Needs (11/15/2023)   PRAPARE - Administrator, Civil Service (Medical): No    Lack of Transportation (Non-Medical): No  Physical Activity: Insufficiently Active (11/15/2023)   Exercise Vital Sign    Days of Exercise per Week: 3 days    Minutes of Exercise per Session: 10 min  Stress: No Stress Concern Present (11/15/2023)   [employer] of Occupational Health - Occupational Stress Questionnaire  Feeling of Stress : Not at all  Social Connections: Moderately Integrated (11/15/2023)   Social Connection and Isolation Panel    Frequency of Communication with Friends and Family: More than three times a week    Frequency of Social Gatherings with Friends and Family: More than three times a week    Attends Religious Services: More than 4 times per year    Active Member of [employer] or Organizations: Yes    Attends Banker Meetings: More than 4 times per year    Marital Status: Widowed  Intimate Partner Violence: Not At Risk (09/27/2023)   Humiliation, Afraid, Rape, and Kick questionnaire    Fear of Current or Ex-Partner: No    Emotionally Abused: No    Physically Abused:  No    Sexually Abused: No    FAMILY HISTORY: Family History  Problem Relation Age of Onset   Colon cancer Brother 42       exposure to chemicals [place]   Hypertension Mother    Stroke Mother    Kidney failure Father    Breast cancer Neg Hx    Ovarian cancer Neg Hx     ALLERGIES:  is allergic to sulfamethoxazole-trimethoprim.  MEDICATIONS:  Current Outpatient Medications  Medication Sig Dispense Refill   acetaminophen  (TYLENOL ) 650 MG CR tablet Take 650 mg by mouth every 8 (eight) hours as needed for pain.     apixaban  (ELIQUIS ) 2.5 MG TABS tablet Take 1 tablet (2.5 mg total) by mouth 2 (two) times daily. 200 tablet 2   Cholecalciferol  (VITAMIN D3) 2000 units capsule Take 2,000 Units by mouth daily.     diclofenac  sodium (VOLTAREN ) 1 % GEL Apply 2 g topically 4 (four) times daily as needed (joint pain).  11   diphenoxylate -atropine  (LOMOTIL ) 2.5-0.025 MG tablet Take 1 tablet by mouth 4 (four) times daily as needed for diarrhea or loose stools. 120 tablet 0   fluticasone  (FLONASE ) 50 MCG/ACT nasal spray Place 1 spray into both nostrils daily as needed for allergies or rhinitis.     lidocaine -prilocaine  (EMLA ) cream Apply 1 Application topically daily as needed. Apply small amount to port and cover with saran wrap 1-2 hours prior to port access 30 g 3   loratadine  (CLARITIN ) 10 MG tablet Take 10 mg by mouth daily.     magnesium  chloride (SLOW-MAG) 64 MG TBEC SR tablet Take 1 tablet (64 mg total) by mouth 2 (two) times daily. 60 tablet 2   megestrol  (MEGACE ) 40 MG tablet Take 1 tablet (40 mg total) by mouth 2 (two) times daily. 60 tablet 1   Multiple Vitamins-Minerals (ONE-A-DAY WOMENS 50 PLUS PO) Take 1 tablet by mouth daily.      ondansetron  (ZOFRAN ) 8 MG tablet Take 1 tablet (8 mg total) by mouth every 8 (eight) hours as needed for nausea or vomiting. 60 tablet 1   potassium chloride  SA (KLOR-CON  M) 20 MEQ tablet TAKE 1 TABLET BY MOUTH DAILY 100 tablet 1   prochlorperazine   (COMPAZINE ) 10 MG tablet Take 1 tablet (10 mg total) by mouth every 6 (six) hours as needed (NAUSEA). 30 tablet 1   simvastatin  (ZOCOR ) 40 MG tablet Take 1 tablet (40 mg total) by mouth at bedtime. 100 tablet 3   tiZANidine  (ZANAFLEX ) 4 MG tablet Take 0.5 tablets (2 mg total) by mouth every 8 (eight) hours as needed for muscle spasms. 10 tablet 0   triamterene -hydrochlorothiazide  (DYAZIDE ) 37.5-25 MG capsule TAKE 1 CAPSULE BY MOUTH EACH  DAILY 90 capsule 1  trifluridine -tipiracil  (LONSURF ) 20-8.19 MG tablet Take 3 tablets (60 mg of trifluridine  total) by mouth 2 (two) times daily. 1hr after AM & PM meals days 1-5, 8-12. Repeat every 28d. 60 tablet 1   zinc gluconate 50 MG tablet Take 50 mg by mouth daily.     No current facility-administered medications for this visit.   Facility-Administered Medications Ordered in Other Visits  Medication Dose Route Frequency Provider Last Rate Last Admin   0.9 %  sodium chloride  infusion   Intravenous Continuous Babara Call, MD   Stopped at 05/01/24 1019   heparin  lock flush 100 UNIT/ML injection              PHYSICAL EXAMINATION: ECOG PERFORMANCE STATUS: 1 - Symptomatic but completely ambulatory  Physical Exam Constitutional:      General: She is not in acute distress. HENT:     Head: Normocephalic and atraumatic.  Eyes:     General: No scleral icterus. Cardiovascular:     Rate and Rhythm: Normal rate.     Heart sounds: Normal heart sounds.  Pulmonary:     Effort: Pulmonary effort is normal. No respiratory distress.     Breath sounds: No wheezing.  Abdominal:     General: Bowel sounds are normal. There is no distension.     Palpations: Abdomen is soft.     Comments: + Colostomy bag   Musculoskeletal:        General: No deformity. Normal range of motion.     Cervical back: Normal range of motion and neck supple.     Right lower leg: Edema present.  Skin:    General: Skin is warm and dry.     Comments: Right groin skin nodules with open wound  covered with dressing.   Neurological:     Mental Status: She is alert and oriented to person, place, and time. Mental status is at baseline.       LABORATORY DATA:  I have reviewed the data as listed    Latest Ref Rng & Units 05/01/2024    8:49 AM 04/17/2024    8:16 AM 04/03/2024    8:27 AM  CBC  WBC 4.0 - 10.5 K/uL 4.4  5.1  4.0   Hemoglobin 12.0 - 15.0 g/dL 89.3  89.0  89.2   Hematocrit 36.0 - 46.0 % 32.8  33.6  32.3   Platelets 150 - 400 K/uL 223  195  170       Latest Ref Rng & Units 05/01/2024    8:49 AM 04/17/2024    8:16 AM 04/03/2024    8:27 AM  CMP  Glucose 70 - 99 mg/dL 871  887  893   BUN 8 - 23 mg/dL 23  21  17    Creatinine 0.44 - 1.00 mg/dL 9.24  9.26  9.38   Sodium 135 - 145 mmol/L 137  136  135   Potassium 3.5 - 5.1 mmol/L 3.6  3.5  3.6   Chloride 98 - 111 mmol/L 105  103  101   CO2 22 - 32 mmol/L 24  27  26    Calcium  8.9 - 10.3 mg/dL 9.3  9.3  9.3   Total Protein 6.5 - 8.1 g/dL 7.1  6.9  6.8   Total Bilirubin 0.0 - 1.2 mg/dL 0.7  0.6  0.7   Alkaline Phos 38 - 126 U/L 67  74  65   AST 15 - 41 U/L 34  39  31   ALT 0 - 44  U/L 22  21  17       RADIOGRAPHIC STUDIES: I have personally reviewed the radiological images as listed and agreed with the findings in the report. CT CHEST ABDOMEN PELVIS W CONTRAST Result Date: 04/16/2024 CLINICAL DATA:  Metastatic rectal cancer restaging * Tracking Code: BO * EXAM: CT CHEST, ABDOMEN, AND PELVIS WITH CONTRAST TECHNIQUE: Multidetector CT imaging of the chest, abdomen and pelvis was performed following the standard protocol during bolus administration of intravenous contrast. RADIATION DOSE REDUCTION: This exam was performed according to the departmental dose-optimization program which includes automated exposure control, adjustment of the mA and/or kV according to patient size and/or use of iterative reconstruction technique. CONTRAST:  85mL OMNIPAQUE  IOHEXOL  300 MG/ML  SOLN COMPARISON:  PET-CT, 01/14/2024 FINDINGS: CT CHEST  FINDINGS Cardiovascular: Aortic atherosclerosis. Normal heart size. Left coronary artery calcifications. No pericardial effusion. Mediastinum/Nodes: No significant change in enlarged lower left cervical lymph node measuring 2.1 x 1.3 cm, previously FDG avid (series 2, image 6). Thyroid  gland, trachea, and esophagus demonstrate no significant findings. Lungs/Pleura: Numerous bilateral pulmonary nodules not significantly changed, largest index nodule of the medial segment right middle lobe measuring 1.5 x 1.4 cm (series 3, image 99), additional index nodule in the peripheral left upper lobe measuring 0.7 cm (series 3, image 40). No pleural effusion or pneumothorax. Musculoskeletal: No chest wall abnormality. No acute osseous findings. CT ABDOMEN PELVIS FINDINGS Hepatobiliary: No focal liver abnormality is seen. Status post cholecystectomy. No biliary dilatation. Pancreas: Unremarkable. No pancreatic ductal dilatation or surrounding inflammatory changes. Spleen: Normal in size without significant abnormality. Adrenals/Urinary Tract: Adrenal glands are unremarkable. Multiple benign bilateral renal cortical cysts, for which no further follow-up or characterization is required. Kidneys are otherwise normal, without renal calculi, solid lesion, or hydronephrosis. Bladder is unremarkable. Stomach/Bowel: Stomach is within normal limits. Normal appendix. Status post abdominoperineal resection with left lower quadrant end colostomy. New, eccentric, nodular wall thickening of the colon at the hepatic flexure measuring 2.3 x 1.4 cm (series 2, image 69). Vascular/Lymphatic: Aortic atherosclerosis. Similar appearance of numerous matted, previously FDG avid retroperitoneal and pelvic lymph nodes, index left retroperitoneal node or conglomerate measuring 2.0 x 1.4 cm (series 2, image 66). Unchanged index right external iliac node measures 1.8 x 1.4 cm with matted soft tissue about the right common femoral artery likewise unchanged  (series 2, image 78, 104). Reproductive: Status post hysterectomy. Other: Left lower quadrant end colostomy with a small parastomal hernia. Large abdominal pannus with skin thickening and nodularity of both the external surface and underlying fold, previously FDG avid (series 2, image 106). No ascites. Musculoskeletal: No acute osseous findings. Numerous sclerotic osseous metastases involving the axial and proximal appendicular skeleton, for example of the proximal right humerus, right clavicular head and left ilium CT appearance not significantly changed. IMPRESSION: 1. Status post abdominoperineal resection with left lower quadrant end colostomy. 2. New, eccentric, nodular wall thickening of the colon at the hepatic flexure measuring 2.3 x 1.4 cm. This may be transient and peristaltic however a metastatic peritoneal implant or metachronous colon malignancy not excluded. Attention on follow-up. 3. Otherwise no significant change in previously FDG avid widespread pulmonary, nodal, and osseous metastatic disease, as well as soft tissue involvement of the right lower quadrant abdominal wall and pannus. 4. Coronary artery disease. Aortic Atherosclerosis (ICD10-I70.0). Electronically Signed   By: Marolyn JONETTA Jaksch M.D.   On: 04/16/2024 06:45      CT CHEST ABDOMEN PELVIS W CONTRAST Result Date: 04/16/2024 CLINICAL DATA:  Metastatic rectal cancer restaging *  Tracking Code: BO * EXAM: CT CHEST, ABDOMEN, AND PELVIS WITH CONTRAST TECHNIQUE: Multidetector CT imaging of the chest, abdomen and pelvis was performed following the standard protocol during bolus administration of intravenous contrast. RADIATION DOSE REDUCTION: This exam was performed according to the departmental dose-optimization program which includes automated exposure control, adjustment of the mA and/or kV according to patient size and/or use of iterative reconstruction technique. CONTRAST:  85mL OMNIPAQUE  IOHEXOL  300 MG/ML  SOLN COMPARISON:  PET-CT,  01/14/2024 FINDINGS: CT CHEST FINDINGS Cardiovascular: Aortic atherosclerosis. Normal heart size. Left coronary artery calcifications. No pericardial effusion. Mediastinum/Nodes: No significant change in enlarged lower left cervical lymph node measuring 2.1 x 1.3 cm, previously FDG avid (series 2, image 6). Thyroid  gland, trachea, and esophagus demonstrate no significant findings. Lungs/Pleura: Numerous bilateral pulmonary nodules not significantly changed, largest index nodule of the medial segment right middle lobe measuring 1.5 x 1.4 cm (series 3, image 99), additional index nodule in the peripheral left upper lobe measuring 0.7 cm (series 3, image 40). No pleural effusion or pneumothorax. Musculoskeletal: No chest wall abnormality. No acute osseous findings. CT ABDOMEN PELVIS FINDINGS Hepatobiliary: No focal liver abnormality is seen. Status post cholecystectomy. No biliary dilatation. Pancreas: Unremarkable. No pancreatic ductal dilatation or surrounding inflammatory changes. Spleen: Normal in size without significant abnormality. Adrenals/Urinary Tract: Adrenal glands are unremarkable. Multiple benign bilateral renal cortical cysts, for which no further follow-up or characterization is required. Kidneys are otherwise normal, without renal calculi, solid lesion, or hydronephrosis. Bladder is unremarkable. Stomach/Bowel: Stomach is within normal limits. Normal appendix. Status post abdominoperineal resection with left lower quadrant end colostomy. New, eccentric, nodular wall thickening of the colon at the hepatic flexure measuring 2.3 x 1.4 cm (series 2, image 69). Vascular/Lymphatic: Aortic atherosclerosis. Similar appearance of numerous matted, previously FDG avid retroperitoneal and pelvic lymph nodes, index left retroperitoneal node or conglomerate measuring 2.0 x 1.4 cm (series 2, image 66). Unchanged index right external iliac node measures 1.8 x 1.4 cm with matted soft tissue about the right common  femoral artery likewise unchanged (series 2, image 78, 104). Reproductive: Status post hysterectomy. Other: Left lower quadrant end colostomy with a small parastomal hernia. Large abdominal pannus with skin thickening and nodularity of both the external surface and underlying fold, previously FDG avid (series 2, image 106). No ascites. Musculoskeletal: No acute osseous findings. Numerous sclerotic osseous metastases involving the axial and proximal appendicular skeleton, for example of the proximal right humerus, right clavicular head and left ilium CT appearance not significantly changed. IMPRESSION: 1. Status post abdominoperineal resection with left lower quadrant end colostomy. 2. New, eccentric, nodular wall thickening of the colon at the hepatic flexure measuring 2.3 x 1.4 cm. This may be transient and peristaltic however a metastatic peritoneal implant or metachronous colon malignancy not excluded. Attention on follow-up. 3. Otherwise no significant change in previously FDG avid widespread pulmonary, nodal, and osseous metastatic disease, as well as soft tissue involvement of the right lower quadrant abdominal wall and pannus. 4. Coronary artery disease. Aortic Atherosclerosis (ICD10-I70.0). Electronically Signed   By: Marolyn JONETTA Jaksch M.D.   On: 04/16/2024 06:45

## 2024-05-01 NOTE — Progress Notes (Signed)
 Nutrition Follow-up:  Patient with metastatic rectal cancer on palliative chemotherapy.  Patient taking lonsurf  and bevacizumab .  Met with patient during infusion.  Reports that appetite is better.  Woke up this am hungry and ready to eat.  Able to eat salmon cake biscuit and boost shake.  Drinking a boost VHC shake and ensure complete each day.  Usually eats  2 meals a day and eating snacks between meals.  At night at times will have ice cream with snack cake or shake.      Medications: megace  added (started 7/3)  Labs: reviewed  Anthropometrics:   Weight 154 lb 9.6 oz today  155 lb 6.4 oz on 6/19 156 lb on 5/22 155 lb on 5/8 169 lb on 1/28 (edema)   NUTRITION DIAGNOSIS: Altered GI function improving   INTERVENTION:  Continue appetite stimulant Continue boost VHC and ensure complete Continue high calorie, high protein foods to maintain weight    MONITORING, EVALUATION, GOAL: weight trends, intake   NEXT VISIT: Thursday, Aug 14 during infusion  Azariel Banik B. Dasie SOLON, CSO, LDN Registered Dietitian [phone]

## 2024-05-01 NOTE — Addendum Note (Signed)
 Addended by: BABARA CALL on: 05/01/2024 12:04 PM   Modules accepted: Orders

## 2024-05-01 NOTE — Assessment & Plan Note (Addendum)
 History of stage IIIC Rectal cancer, s/p TNT, followed by 09/17/19 APR/posterior vaginectomy/TAH/BSO/VY-flap, pT4b pN0 with close vaginal margin 0.2 mm.  Uterus and ovaries negative for malignancy. palliative radiation to vaginal recurrence- 01/19/21 recurrence with lung metastasis.-Palliative -FOLFIRI plus bevacizumab .  Irinotecan  was dropped in November 2022 due to side effects. Negative for UGT1A1*28 - radiographically stable, rise of CEA-July 2023 CT lung metastasis worse--> Dec 2023 PET showed progression in pelvic lymph nodes and bone lesions,--> 2nd line irinotecan  +panitumumab -->CT March 2024 stable. --> June 2024 CT showed mild lung progression/Progressive left iliac bone metastasis, vulvar mass difficult for biopsy in office,  Tempus Liquid biopsy - no actionable variants --> 06/25/2023 CT showed progression of lung nodules [SLD increase 28mm] and iliac inguinal lymphadenopathy--> 07/23/23 switch to dose reduced FOLFOX --> Dec 2024 CT- stable disease. --> Feb 2025 disease progression--> longsurf + Bevacizumab   Labs are reviewed and discussed with patient   CEA nadir was in 490, progressively increases - 10,193  Currently on longsurf [60mg  BID day 1-5 and day 8- 12 every 28-day  + Bevacizumab  D1 proceed with D15 Bevacizumab  Proceed with cycle 5  D15  Bevacizumab  CT findings are discussed with patient and daughter.

## 2024-05-03 DIAGNOSIS — Z66 Do not resuscitate: Secondary | ICD-10-CM | POA: Diagnosis not present

## 2024-05-03 DIAGNOSIS — R1013 Epigastric pain: Secondary | ICD-10-CM | POA: Diagnosis not present

## 2024-05-03 DIAGNOSIS — Z85040 Personal history of malignant carcinoid tumor of rectum: Secondary | ICD-10-CM | POA: Diagnosis not present

## 2024-05-03 DIAGNOSIS — L8990 Pressure ulcer of unspecified site, unspecified stage: Secondary | ICD-10-CM | POA: Diagnosis not present

## 2024-05-03 DIAGNOSIS — Z87891 Personal history of nicotine dependence: Secondary | ICD-10-CM | POA: Insufficient documentation

## 2024-05-03 DIAGNOSIS — N281 Cyst of kidney, acquired: Secondary | ICD-10-CM | POA: Diagnosis not present

## 2024-05-03 DIAGNOSIS — K56600 Partial intestinal obstruction, unspecified as to cause: Principal | ICD-10-CM | POA: Insufficient documentation

## 2024-05-03 DIAGNOSIS — E785 Hyperlipidemia, unspecified: Secondary | ICD-10-CM | POA: Insufficient documentation

## 2024-05-03 DIAGNOSIS — Z7969 Long term (current) use of other immunomodulators and immunosuppressants: Secondary | ICD-10-CM | POA: Diagnosis not present

## 2024-05-03 DIAGNOSIS — C7800 Secondary malignant neoplasm of unspecified lung: Secondary | ICD-10-CM | POA: Diagnosis not present

## 2024-05-03 DIAGNOSIS — T451X5A Adverse effect of antineoplastic and immunosuppressive drugs, initial encounter: Secondary | ICD-10-CM | POA: Insufficient documentation

## 2024-05-03 DIAGNOSIS — K5730 Diverticulosis of large intestine without perforation or abscess without bleeding: Secondary | ICD-10-CM | POA: Diagnosis not present

## 2024-05-03 DIAGNOSIS — I10 Essential (primary) hypertension: Secondary | ICD-10-CM | POA: Diagnosis not present

## 2024-05-03 DIAGNOSIS — D84821 Immunodeficiency due to drugs: Secondary | ICD-10-CM | POA: Insufficient documentation

## 2024-05-03 DIAGNOSIS — R109 Unspecified abdominal pain: Secondary | ICD-10-CM | POA: Diagnosis present

## 2024-05-03 DIAGNOSIS — Z79899 Other long term (current) drug therapy: Secondary | ICD-10-CM | POA: Insufficient documentation

## 2024-05-03 DIAGNOSIS — Z86718 Personal history of other venous thrombosis and embolism: Secondary | ICD-10-CM | POA: Diagnosis not present

## 2024-05-04 ENCOUNTER — Other Ambulatory Visit: Payer: Self-pay

## 2024-05-04 ENCOUNTER — Observation Stay
Admission: EM | Admit: 2024-05-04 | Discharge: 2024-05-05 | Disposition: A | Discharge status: Home / Self Care | Attending: Internal Medicine | Admitting: Internal Medicine

## 2024-05-04 ENCOUNTER — Inpatient Hospital Stay

## 2024-05-04 ENCOUNTER — Emergency Department

## 2024-05-04 DIAGNOSIS — K5730 Diverticulosis of large intestine without perforation or abscess without bleeding: Secondary | ICD-10-CM | POA: Diagnosis not present

## 2024-05-04 DIAGNOSIS — K56609 Unspecified intestinal obstruction, unspecified as to partial versus complete obstruction: Secondary | ICD-10-CM | POA: Diagnosis not present

## 2024-05-04 DIAGNOSIS — L8990 Pressure ulcer of unspecified site, unspecified stage: Secondary | ICD-10-CM | POA: Insufficient documentation

## 2024-05-04 DIAGNOSIS — I10 Essential (primary) hypertension: Secondary | ICD-10-CM | POA: Diagnosis present

## 2024-05-04 DIAGNOSIS — D84821 Immunodeficiency due to drugs: Secondary | ICD-10-CM

## 2024-05-04 DIAGNOSIS — C7800 Secondary malignant neoplasm of unspecified lung: Secondary | ICD-10-CM | POA: Diagnosis not present

## 2024-05-04 DIAGNOSIS — E785 Hyperlipidemia, unspecified: Secondary | ICD-10-CM | POA: Diagnosis not present

## 2024-05-04 DIAGNOSIS — C189 Malignant neoplasm of colon, unspecified: Secondary | ICD-10-CM | POA: Diagnosis present

## 2024-05-04 DIAGNOSIS — Z7969 Long term (current) use of other immunomodulators and immunosuppressants: Secondary | ICD-10-CM

## 2024-05-04 DIAGNOSIS — R1013 Epigastric pain: Secondary | ICD-10-CM | POA: Diagnosis not present

## 2024-05-04 DIAGNOSIS — Z66 Do not resuscitate: Secondary | ICD-10-CM | POA: Diagnosis present

## 2024-05-04 DIAGNOSIS — K56600 Partial intestinal obstruction, unspecified as to cause: Secondary | ICD-10-CM | POA: Diagnosis not present

## 2024-05-04 DIAGNOSIS — Z933 Colostomy status: Secondary | ICD-10-CM

## 2024-05-04 DIAGNOSIS — N281 Cyst of kidney, acquired: Secondary | ICD-10-CM | POA: Diagnosis not present

## 2024-05-04 LAB — COMPREHENSIVE METABOLIC PANEL WITH GFR
ALT: 20 U/L (ref 0–44)
ALT: 22 U/L (ref 0–44)
AST: 30 U/L (ref 15–41)
AST: 36 U/L (ref 15–41)
Albumin: 3.3 g/dL — ABNORMAL LOW (ref 3.5–5.0)
Albumin: 4 g/dL (ref 3.5–5.0)
Alkaline Phosphatase: 104 U/L (ref 38–126)
Alkaline Phosphatase: 62 U/L (ref 38–126)
Anion gap: 11 (ref 5–15)
Anion gap: 9 (ref 5–15)
BUN: 19 mg/dL (ref 8–23)
BUN: 21 mg/dL (ref 8–23)
CO2: 23 mmol/L (ref 22–32)
CO2: 26 mmol/L (ref 22–32)
Calcium: 10.1 mg/dL (ref 8.9–10.3)
Calcium: 9.5 mg/dL (ref 8.9–10.3)
Chloride: 101 mmol/L (ref 98–111)
Chloride: 103 mmol/L (ref 98–111)
Creatinine, Ser: 0.72 mg/dL (ref 0.44–1.00)
Creatinine, Ser: 0.76 mg/dL (ref 0.44–1.00)
GFR, Estimated: >60 mL/min (ref >60)
GFR, Estimated: >60 mL/min (ref >60)
Glucose, Bld: 132 mg/dL — ABNORMAL HIGH (ref 70–99)
Glucose, Bld: 161 mg/dL — ABNORMAL HIGH (ref 70–99)
Potassium: 3.6 mmol/L (ref 3.5–5.1)
Potassium: 3.9 mmol/L (ref 3.5–5.1)
Sodium: 136 mmol/L (ref 135–145)
Sodium: 137 mmol/L (ref 135–145)
Total Bilirubin: 0.8 mg/dL (ref 0.0–1.2)
Total Bilirubin: 0.9 mg/dL (ref 0.0–1.2)
Total Protein: 6.6 g/dL (ref 6.5–8.1)
Total Protein: 7.7 g/dL (ref 6.5–8.1)

## 2024-05-04 LAB — CBC
HCT: 34.8 % — ABNORMAL LOW (ref 36.0–46.0)
Hemoglobin: 11.7 g/dL — ABNORMAL LOW (ref 12.0–15.0)
MCH: 33.8 pg (ref 26.0–34.0)
MCHC: 33.6 g/dL (ref 30.0–36.0)
MCV: 100.6 fL — ABNORMAL HIGH (ref 80.0–100.0)
Platelets: 189 K/uL (ref 150–400)
RBC: 3.46 MIL/uL — ABNORMAL LOW (ref 3.87–5.11)
RDW: 16.7 % — ABNORMAL HIGH (ref 11.5–15.5)
WBC: 9.7 K/uL (ref 4.0–10.5)
nRBC: 0 % (ref 0.0–0.2)

## 2024-05-04 LAB — URINALYSIS, ROUTINE W REFLEX MICROSCOPIC
Bilirubin Urine: NEGATIVE
Glucose, UA: NEGATIVE mg/dL
Hgb urine dipstick: NEGATIVE
Ketones, ur: NEGATIVE mg/dL
Leukocytes,Ua: NEGATIVE
Nitrite: NEGATIVE
Protein, ur: NEGATIVE mg/dL
Specific Gravity, Urine: 1.044 — ABNORMAL HIGH (ref 1.005–1.030)
pH: 5 (ref 5.0–8.0)

## 2024-05-04 LAB — LACTIC ACID, PLASMA
Lactic Acid, Venous: 1.8 mmol/L (ref 0.5–1.9)
Lactic Acid, Venous: 3.5 mmol/L (ref 0.5–1.9)
Lactic Acid, Venous: 1.6 mmol/L (ref 0.5–1.9)
Lactic Acid, Venous: 1.9 mmol/L (ref 0.5–1.9)

## 2024-05-04 LAB — LIPASE, BLOOD: Lipase: 32 U/L (ref 11–51)

## 2024-05-04 MED ORDER — MEGESTROL ACETATE 20 MG PO TABS
40.0000 mg | ORAL_TABLET | Freq: Two times a day (BID) | ORAL | Status: DC
  Administered 2024-05-04 – 2024-05-05 (×3): 40 mg via ORAL
  Filled 2024-05-04 (×4): qty 2

## 2024-05-04 MED ORDER — VITAMIN D 25 MCG (1000 UNIT) PO TABS
2000.0000 [IU] | ORAL_TABLET | Freq: Every day | ORAL | Status: DC
  Administered 2024-05-04 – 2024-05-05 (×2): 2000 [IU] via ORAL
  Filled 2024-05-04 (×2): qty 2

## 2024-05-04 MED ORDER — TRIAMTERENE-HCTZ 37.5-25 MG PO TABS
1.0000 | ORAL_TABLET | Freq: Every day | ORAL | Status: DC
  Administered 2024-05-04 – 2024-05-05 (×2): 1 via ORAL
  Filled 2024-05-04 (×2): qty 1

## 2024-05-04 MED ORDER — MAGNESIUM CHLORIDE 64 MG PO TBEC
1.0000 | DELAYED_RELEASE_TABLET | Freq: Two times a day (BID) | ORAL | Status: DC
  Administered 2024-05-04 – 2024-05-05 (×3): 64 mg via ORAL
  Filled 2024-05-04 (×4): qty 1

## 2024-05-04 MED ORDER — MORPHINE SULFATE (PF) 2 MG/ML IV SOLN
2.0000 mg | INTRAVENOUS | Status: DC | PRN
  Administered 2024-05-04 (×2): 2 mg via INTRAVENOUS
  Filled 2024-05-04 (×2): qty 1

## 2024-05-04 MED ORDER — IOHEXOL 300 MG/ML  SOLN
100.0000 mL | Freq: Once | INTRAMUSCULAR | Status: AC | PRN
  Administered 2024-05-04: 100 mL via INTRAVENOUS

## 2024-05-04 MED ORDER — POTASSIUM CHLORIDE CRYS ER 20 MEQ PO TBCR
20.0000 meq | EXTENDED_RELEASE_TABLET | Freq: Every day | ORAL | Status: DC
  Administered 2024-05-04 – 2024-05-05 (×2): 20 meq via ORAL
  Filled 2024-05-04 (×2): qty 1

## 2024-05-04 MED ORDER — FLUTICASONE PROPIONATE 50 MCG/ACT NA SUSP: 1.0000 | Freq: Every day | NASAL | Status: DC | PRN

## 2024-05-04 MED ORDER — ACETAMINOPHEN 650 MG RE SUPP: 650.0000 mg | Freq: Four times a day (QID) | RECTAL | Status: DC | PRN

## 2024-05-04 MED ORDER — SIMVASTATIN 20 MG PO TABS
40.0000 mg | ORAL_TABLET | Freq: Every day | ORAL | Status: DC
  Administered 2024-05-04: 40 mg via ORAL
  Filled 2024-05-04: qty 2

## 2024-05-04 MED ORDER — ONDANSETRON HCL 4 MG/2ML IJ SOLN: 4.0000 mg | Freq: Four times a day (QID) | INTRAMUSCULAR | Status: DC | PRN

## 2024-05-04 MED ORDER — TRAZODONE HCL 50 MG PO TABS: 25.0000 mg | ORAL_TABLET | Freq: Every evening | ORAL | Status: DC | PRN

## 2024-05-04 MED ORDER — ADULT MULTIVITAMIN W/MINERALS CH
1.0000 | ORAL_TABLET | Freq: Every day | ORAL | Status: DC
  Administered 2024-05-04 – 2024-05-05 (×2): 1 via ORAL
  Filled 2024-05-04 (×2): qty 1

## 2024-05-04 MED ORDER — DIPHENOXYLATE-ATROPINE 2.5-0.025 MG PO TABS: 1.0000 | ORAL_TABLET | Freq: Four times a day (QID) | ORAL | Status: DC | PRN

## 2024-05-04 MED ORDER — ENOXAPARIN SODIUM 40 MG/0.4ML IJ SOSY
40.0000 mg | PREFILLED_SYRINGE | INTRAMUSCULAR | Status: DC
  Administered 2024-05-04 – 2024-05-05 (×2): 40 mg via SUBCUTANEOUS
  Filled 2024-05-04 (×2): qty 0.4

## 2024-05-04 MED ORDER — ACETAMINOPHEN 325 MG PO TABS: 650.0000 mg | ORAL_TABLET | Freq: Four times a day (QID) | ORAL | Status: DC | PRN

## 2024-05-04 MED ORDER — POTASSIUM CHLORIDE IN NACL 20-0.9 MEQ/L-% IV SOLN
INTRAVENOUS | Status: DC
  Filled 2024-05-04 (×2): qty 1000

## 2024-05-04 MED ORDER — ONDANSETRON HCL 4 MG/2ML IJ SOLN
4.0000 mg | INTRAMUSCULAR | Status: AC
  Administered 2024-05-04: 4 mg via INTRAVENOUS
  Filled 2024-05-04: qty 2

## 2024-05-04 MED ORDER — ONDANSETRON HCL 4 MG PO TABS: 4.0000 mg | ORAL_TABLET | Freq: Four times a day (QID) | ORAL | Status: DC | PRN

## 2024-05-04 MED ORDER — MORPHINE SULFATE (PF) 4 MG/ML IV SOLN
4.0000 mg | Freq: Once | INTRAVENOUS | Status: AC
  Administered 2024-05-04: 4 mg via INTRAVENOUS
  Filled 2024-05-04: qty 1

## 2024-05-04 MED ORDER — ZINC SULFATE 220 (50 ZN) MG PO CAPS
220.0000 mg | ORAL_CAPSULE | Freq: Every day | ORAL | Status: DC
  Administered 2024-05-04 – 2024-05-05 (×2): 220 mg via ORAL
  Filled 2024-05-04 (×2): qty 1

## 2024-05-04 MED ORDER — TIZANIDINE HCL 2 MG PO TABS: 2.0000 mg | ORAL_TABLET | Freq: Three times a day (TID) | ORAL | Status: DC | PRN

## 2024-05-04 MED ORDER — LACTATED RINGERS IV BOLUS
1000.0000 mL | Freq: Once | INTRAVENOUS | Status: AC
  Administered 2024-05-04: 1000 mL via INTRAVENOUS

## 2024-05-04 MED ORDER — LORATADINE 10 MG PO TABS
10.0000 mg | ORAL_TABLET | Freq: Every day | ORAL | Status: DC
  Administered 2024-05-04 – 2024-05-05 (×2): 10 mg via ORAL
  Filled 2024-05-04 (×2): qty 1

## 2024-05-04 MED ORDER — DICLOFENAC SODIUM 1 % EX GEL: 2.0000 g | Freq: Four times a day (QID) | CUTANEOUS | Status: DC | PRN

## 2024-05-04 MED ORDER — PROCHLORPERAZINE MALEATE 10 MG PO TABS: 10.0000 mg | ORAL_TABLET | Freq: Four times a day (QID) | ORAL | Status: DC | PRN

## 2024-05-04 NOTE — Assessment & Plan Note (Signed)
 -  We will continue statin therapy.

## 2024-05-04 NOTE — Progress Notes (Signed)
 Progress Note   Patient: Jacqueline Yoder FMW:[id number] DOB: [birth year] DOA: 05/04/2024     0 DOS: the patient was seen and examined on 05/04/2024   Brief hospital course: 78yo with h/o HTN, HLD, and rectal cancer with lung and bony metastases who presented on 7/20 with abdominal pain and was found to have partial distal SBO.  No vomiting so did not require NG tube.  Surgery is consulted.  Appears to have resolved spontaneously and she is passing flatus and stool and tolerating a diet.  Assessment and Plan:  Partial small bowel obstruction Known h/o rectal cancer and prior SBO Presented with abdominal pain CT with partial SBO, parastomal hernia Admitted to medi surg No recurrent vomiting to require NG tube placement General Surgery consulted, has signed off Lactic acidosis has cleared Obstruction appears to have cleared spontaneously She is tolerating a diet She prefers ongoing overnight monitoring to ensure no further recurrence She is at high risk for ongoing issues, however, and this has been clearly explained to the patient and her family   Colon cancer metastasized to lung, bone Diagnosed in 2020 but has progressed and is widely metastatic per recent imaging She is being treated with Lonsurf  and Bevacizumab  She is followed by Dr. Babara 6/26 imaging with concern for a new peritoneal or metachronous colon lesion noted, will be discussed at tumor board per family  Continue Megace , MVI   Dyslipidemia Continue simvastatin     Essential hypertension Continue Dyazide   H/o DVT/PE Held Eliquis  due to possible need for surgery on presentation Will resume Eliquis  on 7/21, assuming no further issues  DNR DNR confirmed at the time of admission [facility] documents reviewed Patient will need a gold out of facility DNR form at the time of discharge     Consultants: Surgery  Procedures: None  Antibiotics: None  30 Day Unplanned Readmission Risk Score    Flowsheet Row ED to  Hosp-Admission (Current) from 05/04/2024 in [facility] Emergency Department at [facility]  30 Day Unplanned Readmission Risk Score (%) 16.84 Filed at 05/04/2024 0801    This score is the patient's risk of an unplanned readmission within 30 days of being discharged (0 -100%). The score is based on dignosis, age, lab data, medications, orders, and past utilization.   Low:  0-14.9   Medium: 15-21.9   High: 22-29.9   Extreme: 30 and above            Subjective: Feels much better.  No pain.  Reluctant to go home because she is afraid this will recur.  Tolerating CLD.  Physical Exam: Vitals:   05/04/24 1330 05/04/24 1400 05/04/24 1430 05/04/24 1548  BP: 116/65 (!) 101/55 (!) 124/57   Pulse: 88 86 84   Resp: (!) 21 20 18    Temp:    98.5 F (36.9 C)  TempSrc:    Oral  SpO2: 98% 98% 98%   Height:        Intake/Output Summary (Last 24 hours) at 05/04/2024 1607 Last data filed at 05/04/2024 0313 Gross per 24 hour  Intake 1000 ml  Output --  Net 1000 ml   There were no vitals filed for this visit.  Exam:  General:  Appears calm and comfortable and is in NAD Eyes:  normal lids, iris ENT:  grossly normal hearing, lips & tongue, mmm Cardiovascular:  RRR. No LE edema.  Respiratory:   CTA bilaterally with no wheezes/rales/rhonchi.  Normal respiratory effort. Abdomen:  soft, NT, ND; large R-sided pannus with chronic  skin wound along the base; ostomy in place with copious brown stool (during subsequent visit, she had frequent recurrent flatulence from her ostomy) Skin:  no rash or induration seen on limited exam Musculoskeletal:  grossly normal tone BUE/BLE, good ROM, no bony abnormality Psychiatric:  grossly normal mood and affect, speech fluent and appropriate, AOx3 Neurologic:  CN 2-12 grossly intact, moves all extremities in coordinated fashion  Data Reviewed: I have reviewed the patient's lab results since admission.  Pertinent labs for today include:   Unremarkable  CMP Lactate 1.8, 3.5    Family Communication: None initially; daughters were present on subsequent visit and son was on the telephone  Disposition: Status is: Inpatient Remains inpatient appropriate because: ongoing monitoring  Planned Discharge Destination: Home    Time spent: 50 minutes  Author: Delon Herald, MD 05/04/2024 4:07 PM  For on call review [email or web address].

## 2024-05-04 NOTE — Assessment & Plan Note (Signed)
-   The patient be admitted to a medical-surgical bed. - Should be kept n.p.o. except for medications. - At this time she did not have any recurrent vomiting to require NG tube placement. - Will repeat two-view abdomen x-ray in AM. - General Surgery consult will be obtained. - I notified Dr. Desiderio about the patient. - Will repeat lactic acid level given its elevation.

## 2024-05-04 NOTE — Consult Note (Signed)
 Date of Consultation:  05/04/2024  Requesting Physician:  Madison Peaches, MD  Reason for Consultation: Small bowel obstruction  History of Present Illness: Jacqueline Yoder is a 78 y.o. female admitted overnight with small bowel obstruction.  The patient has a prior history of open abdominoperineal resection with end colostomy and total abdominal hysterectomy for rectal cancer on 09/17/2019.  She unfortunately has had recurrence with metastatic disease including metastasis to the lung and bone and soft tissue in the pannus with lymphadenopathy within the retroperitoneum.  She reports that yesterday morning she started having abdominal pain that continued to worsen throughout the day at night.  As such, she presented overnight to the emergency department for further evaluation.  She reports having nausea but no vomiting.  Reports having some ostomy output after her breakfast but then decreased output for the rest of the day.  In the emergency room, her laboratory workup showed a white blood cell count of 9.7, hemoglobin 11.7, and otherwise unremarkable CMP.  She had a CT scan of the abdomen pelvis which showed a partial small bowel obstruction likely with transition point in the lower pelvis.  She was admitted to the hospitalist team and general surgery has been consulted for further evaluation.  This morning, the patient reports that she feels great and denies any further abdominal pain and any further nausea.  Her ostomy has been working very well overnight and has had to empty it multiple times.  Past Medical History: Past Medical History:  Diagnosis Date   Allergy    Arthritis    Blood clot in vein    Family history of colon cancer    GERD (gastroesophageal reflux disease)    Hypercholesteremia    Hypertension    Hypertension    Lower extremity edema    Personal history of chemotherapy    Rectal cancer (HCC) 12/2018   Urinary incontinence      Past Surgical History: Past Surgical History:   Procedure Laterality Date   ABDOMINAL HYSTERECTOMY     CHOLECYSTECTOMY  1971   COLONOSCOPY WITH PROPOFOL  N/A 12/03/2018   Procedure: COLONOSCOPY WITH PROPOFOL ;  Surgeon: Jinny Carmine, MD;  Location: [facility] ENDOSCOPY;  Service: Endoscopy;  Laterality: N/A;   COLONOSCOPY WITH PROPOFOL  N/A 07/15/2020   Procedure: COLONOSCOPY WITH PROPOFOL ;  Surgeon: Therisa Bi, MD;  Location: [facility] ENDOSCOPY;  Service: Gastroenterology;  Laterality: N/A;   FLEXIBLE SIGMOIDOSCOPY N/A 12/06/2018   Procedure: FLEXIBLE SIGMOIDOSCOPY;  Surgeon: Therisa Bi, MD;  Location: [facility] ENDOSCOPY;  Service: Endoscopy;  Laterality: N/A;   LAPAROSCOPIC COLOSTOMY  01/06/2019   PORTACATH PLACEMENT N/A 04/03/2019   Procedure: INSERTION PORT-A-CATH;  Surgeon: Jordis Laneta JULIANNA, MD;  Location: [facility] ORS;  Service: General;  Laterality: N/A;    Home Medications: Prior to Admission medications   Medication Sig Start Date End Date Taking? Authorizing Provider  acetaminophen  (TYLENOL ) 650 MG CR tablet Take 650 mg by mouth every 8 (eight) hours as needed for pain.    [provider]  apixaban  (ELIQUIS ) 2.5 MG TABS tablet Take 1 tablet (2.5 mg total) by mouth 2 (two) times daily. 02/14/24   Babara Call, MD  Cholecalciferol  (VITAMIN D3) 2000 units capsule Take 2,000 Units by mouth daily.    [provider]  diclofenac  sodium (VOLTAREN ) 1 % GEL Apply 2 g topically 4 (four) times daily as needed (joint pain). 05/09/18   [provider]  diphenoxylate -atropine  (LOMOTIL ) 2.5-0.025 MG tablet Take 1 tablet by mouth 4 (four) times daily as needed for diarrhea  or loose stools. 09/11/23   Babara Call, MD  fluticasone  (FLONASE ) 50 MCG/ACT nasal spray Place 1 spray into both nostrils daily as needed for allergies or rhinitis. 09/20/23   Barbarann Nest, MD  lidocaine -prilocaine  (EMLA ) cream Apply 1 Application topically daily as needed. Apply small amount to port and cover with saran wrap 1-2 hours prior to port access 12/04/23   Babara Call, MD   loratadine  (CLARITIN ) 10 MG tablet Take 10 mg by mouth daily.    [provider]  magnesium  chloride (SLOW-MAG) 64 MG TBEC SR tablet Take 1 tablet (64 mg total) by mouth 2 (two) times daily. 03/13/23   Babara Call, MD  megestrol  (MEGACE ) 40 MG tablet Take 1 tablet (40 mg total) by mouth 2 (two) times daily. 04/06/24   Babara Call, MD  Multiple Vitamins-Minerals (ONE-A-DAY WOMENS 50 PLUS PO) Take 1 tablet by mouth daily.     [provider]  ondansetron  (ZOFRAN ) 8 MG tablet Take 1 tablet (8 mg total) by mouth every 8 (eight) hours as needed for nausea or vomiting. 04/04/24   Babara Call, MD  potassium chloride  SA (KLOR-CON  M) 20 MEQ tablet TAKE 1 TABLET BY MOUTH DAILY 02/19/24   Babara Call, MD  prochlorperazine  (COMPAZINE ) 10 MG tablet Take 1 tablet (10 mg total) by mouth every 6 (six) hours as needed (NAUSEA). 12/13/23   Babara Call, MD  simvastatin  (ZOCOR ) 40 MG tablet Take 1 tablet (40 mg total) by mouth at bedtime. 07/11/23   Karamalegos, Marsa PARAS, DO  tiZANidine  (ZANAFLEX ) 4 MG tablet Take 0.5 tablets (2 mg total) by mouth every 8 (eight) hours as needed for muscle spasms. 12/21/23   Antonette Angeline ORN, NP  triamterene -hydrochlorothiazide  (DYAZIDE ) 37.5-25 MG capsule TAKE 1 CAPSULE BY MOUTH EACH  DAILY 04/25/24   Edman, Marsa PARAS, DO  trifluridine -tipiracil  (LONSURF ) 20-8.19 MG tablet Take 3 tablets (60 mg of trifluridine  total) by mouth 2 (two) times daily. 1hr after AM & PM meals days 1-5, 8-12. Repeat every 28d. 04/04/24   Babara Call, MD  zinc  gluconate 50 MG tablet Take 50 mg by mouth daily.    [provider]    Allergies: Allergies  Allergen Reactions   Sulfamethoxazole-Trimethoprim Other (See Comments)    Also said she had chills and pressure in nose.    Social History:  reports that she quit smoking about 46 years ago. Her smoking use included cigarettes. She has quit using smokeless tobacco. She reports that she does not drink alcohol and does not use drugs.   Family  History: Family History  Problem Relation Age of Onset   Colon cancer Brother 69       exposure to chemicals [place]   Hypertension Mother    Stroke Mother    Kidney failure Father    Breast cancer Neg Hx    Ovarian cancer Neg Hx     Review of Systems: Review of Systems  Constitutional:  Negative for chills and fever.  Respiratory:  Negative for shortness of breath.   Cardiovascular:  Negative for chest pain.  Gastrointestinal:  Positive for abdominal pain, constipation and nausea. Negative for diarrhea and vomiting.  Genitourinary:  Negative for dysuria.    Physical Exam BP 123/63 (BP Location: Left Arm)   Pulse (!) 15   Temp 98.5 F (36.9 C) (Oral)   Resp 19   Ht 5' 4 (1.626 m)   SpO2 99%   BMI 26.54 kg/m  CONSTITUTIONAL: No acute distress, well-nourished HEENT:  Normocephalic, atraumatic, extraocular  motion intact. NECK: Trachea is midline, and there is no jugular venous distension. RESPIRATORY:  Normal respiratory effort without pathologic use of accessory muscles. CARDIOVASCULAR: Regular rhythm and rate. GI: The abdomen is soft, nondistended, currently nontender to palpation.  Lower midline incision is well-healed.  Left lower quadrant end colostomy with pink and patent mucosa with liquid yellow stool in the bag.  Evidence of parastomal hernia but no tenderness in that area.  MUSCULOSKELETAL:  Normal muscle strength and tone in all four extremities.  No peripheral edema or cyanosis. SKIN: Skin turgor is normal. There are no pathologic skin lesions.  NEUROLOGIC:  Motor and sensation is grossly normal.  Cranial nerves are grossly intact. PSYCH:  Alert and oriented to person, place and time. Affect is normal.  Laboratory Analysis: Results for orders placed or performed during the hospital encounter of 05/04/24 (from the past 24 hours)  Lipase, blood     Status: None   Collection Time: 05/04/24 12:15 AM  Result Value Ref Range   Lipase 32 11 - 51 U/L  Comprehensive  metabolic panel     Status: Abnormal   Collection Time: 05/04/24 12:15 AM  Result Value Ref Range   Sodium 137 135 - 145 mmol/L   Potassium 3.6 3.5 - 5.1 mmol/L   Chloride 103 98 - 111 mmol/L   CO2 23 22 - 32 mmol/L   Glucose, Bld 161 (H) 70 - 99 mg/dL   BUN 21 8 - 23 mg/dL   Creatinine, Ser 9.23 0.44 - 1.00 mg/dL   Calcium  10.1 8.9 - 10.3 mg/dL   Total Protein 7.7 6.5 - 8.1 g/dL   Albumin 4.0 3.5 - 5.0 g/dL   AST 36 15 - 41 U/L   ALT 22 0 - 44 U/L   Alkaline Phosphatase 104 38 - 126 U/L   Total Bilirubin 0.9 0.0 - 1.2 mg/dL   GFR, Estimated >39 >39 mL/min   Anion gap 11 5 - 15  CBC     Status: Abnormal   Collection Time: 05/04/24 12:15 AM  Result Value Ref Range   WBC 9.7 4.0 - 10.5 K/uL   RBC 3.46 (L) 3.87 - 5.11 MIL/uL   Hemoglobin 11.7 (L) 12.0 - 15.0 g/dL   HCT 65.1 (L) 63.9 - 53.9 %   MCV 100.6 (H) 80.0 - 100.0 fL   MCH 33.8 26.0 - 34.0 pg   MCHC 33.6 30.0 - 36.0 g/dL   RDW 83.2 (H) 88.4 - 84.4 %   Platelets 189 150 - 400 K/uL   nRBC 0.0 0.0 - 0.2 %  Lactic acid, plasma     Status: None   Collection Time: 05/04/24  1:22 AM  Result Value Ref Range   Lactic Acid, Venous 1.8 0.5 - 1.9 mmol/L  Urinalysis, Routine w reflex microscopic -Urine, Clean Catch     Status: Abnormal   Collection Time: 05/04/24  2:58 AM  Result Value Ref Range   Color, Urine YELLOW (A) YELLOW   APPearance CLEAR (A) CLEAR   Specific Gravity, Urine 1.044 (H) 1.005 - 1.030   pH 5.0 5.0 - 8.0   Glucose, UA NEGATIVE NEGATIVE mg/dL   Hgb urine dipstick NEGATIVE NEGATIVE   Bilirubin Urine NEGATIVE NEGATIVE   Ketones, ur NEGATIVE NEGATIVE mg/dL   Protein, ur NEGATIVE NEGATIVE mg/dL   Nitrite NEGATIVE NEGATIVE   Leukocytes,Ua NEGATIVE NEGATIVE  Lactic acid, plasma     Status: Abnormal   Collection Time: 05/04/24  2:58 AM  Result Value Ref Range  Lactic Acid, Venous 3.5 (HH) 0.5 - 1.9 mmol/L  Comprehensive metabolic panel     Status: Abnormal   Collection Time: 05/04/24  6:34 AM  Result Value  Ref Range   Sodium 136 135 - 145 mmol/L   Potassium 3.9 3.5 - 5.1 mmol/L   Chloride 101 98 - 111 mmol/L   CO2 26 22 - 32 mmol/L   Glucose, Bld 132 (H) 70 - 99 mg/dL   BUN 19 8 - 23 mg/dL   Creatinine, Ser 9.27 0.44 - 1.00 mg/dL   Calcium  9.5 8.9 - 10.3 mg/dL   Total Protein 6.6 6.5 - 8.1 g/dL   Albumin 3.3 (L) 3.5 - 5.0 g/dL   AST 30 15 - 41 U/L   ALT 20 0 - 44 U/L   Alkaline Phosphatase 62 38 - 126 U/L   Total Bilirubin 0.8 0.0 - 1.2 mg/dL   GFR, Estimated >39 >39 mL/min   Anion gap 9 5 - 15   *Note: Due to a large number of results and/or encounters for the requested time period, some results have not been displayed. A complete set of results can be found in Results Review.    Imaging: CT ABDOMEN PELVIS W CONTRAST Result Date: 05/04/2024 CLINICAL DATA:  Epigastric pain and nausea. EXAM: CT ABDOMEN AND PELVIS WITH CONTRAST TECHNIQUE: Multidetector CT imaging of the abdomen and pelvis was performed using the standard protocol following bolus administration of intravenous contrast. RADIATION DOSE REDUCTION: This exam was performed according to the departmental dose-optimization program which includes automated exposure control, adjustment of the mA and/or kV according to patient size and/or use of iterative reconstruction technique. CONTRAST:  OMNIPAQUE  IOHEXOL  300 MG/ML  SOLN COMPARISON:  April 10, 2024 FINDINGS: Lower chest: Multiple subcentimeter pulmonary nodules are seen within the left upper lobe right middle lobe and bilateral lower lobes (the largest measures approximately 4 mm). A stable 15 mm x 14 mm pulmonary nodule is also seen within the anteromedial aspect of the right middle lobe. Hepatobiliary: There is diffuse fatty infiltration of the liver parenchyma. No focal liver abnormality is seen. Status post cholecystectomy. No biliary dilatation. Pancreas: Unremarkable. No pancreatic ductal dilatation or surrounding inflammatory changes. Spleen: Normal in size without focal  abnormality. Adrenals/Urinary Tract: Adrenal glands are unremarkable. Kidneys are normal in size, without renal calculi or hydronephrosis. Multiple stable bilateral simple renal cysts are seen. Bladder is unremarkable. Stomach/Bowel: Stomach is within normal limits. Appendix appears normal. Mildly dilated small bowel loops are seen within the mid and lower abdomen (maximum small bowel diameter of approximately 3.2 cm). A transition zone is suspected within the pelvis. There is evidence of prior abdominoperineal resection with a left lower quadrant ostomy site noted. Noninflamed diverticula are seen within the remaining visualized large bowel. There is a 5.0 cm x 6.3 cm parastomal hernia which contains fat and a segment of large bowel. Vascular/Lymphatic: Aortic atherosclerosis. No enlarged abdominal or pelvic lymph nodes. Reproductive: Status post hysterectomy. No adnexal masses. Other: No abdominopelvic ascites. Musculoskeletal: Multilevel degenerative changes are seen throughout the lumbar spine. IMPRESSION: 1. Findings consistent with a distal partial small bowel obstruction. 2. Evidence of prior abdominoperineal resection with a left lower quadrant ostomy site. 3. 5.0 cm x 6.3 cm parastomal hernia which contains fat and a segment of large bowel. 4. Colonic diverticulosis. 5. Multiple subcentimeter pulmonary nodules within the left upper lobe right middle lobe and bilateral lower lobes, as described above. 6. Stable 15 mm x 14 mm right middle lobe pulmonary nodule.  7. Hepatic steatosis. 8. Evidence of prior cholecystectomy and hysterectomy. 9. Aortic atherosclerosis. Electronically Signed   By: Suzen Dials M.D.   On: 05/04/2024 02:18    Assessment and Plan: This is a 78 y.o. female with small bowel obstruction, now resolving.  - Discussed with patient the findings on her laboratory workup and imaging studies showing a partial small bowel obstruction with transition point in the lower pelvis.  However  today, the patient feels significantly better and denies any further pain or nausea or any symptoms at all.  Her ostomy is working very well and she has had to empty it multiple times overnight.  As such, no surgical intervention likely is needed at this point.  No NG tube is needed.  Instead, she can have a clear liquid diet and advance her diet as tolerated.  Potentially could be discharged home later today, but all depends on how she continues to progress and advance her diet. - Surgical team will sign off but are available if any questions or concerns.  I spent 45 minutes dedicated to the care of this patient on the date of this encounter to include pre-visit review of records, face-to-face time with the patient discussing diagnosis and management, and any post-visit coordination of care.   Aloysius Sheree Plant, MD [facility] Pg:  [phone]

## 2024-05-04 NOTE — Assessment & Plan Note (Addendum)
-   She has lung and bones metastasis. - Pain management will be provided. - The patient is undergoing active chemotherapy.

## 2024-05-04 NOTE — ED Notes (Signed)
 Spoke with Dr. Lawence in regard to starting pt on potassium chloride  with a potassium of 3.6. Provider would like pts potassium above 4.0.

## 2024-05-04 NOTE — ED Provider Notes (Addendum)
 [facility] Provider Note    Event Date/Time   First MD Initiated Contact with Patient 05/04/24 [phone]     (approximate)   History   Abdominal Pain   HPI Jacqueline Yoder is a 78 y.o. female with history of colon cancer status post colostomy and who is still undergoing both oral and every 2 week IV infusion chemotherapy.  Dr. Babara is her oncologist.  She presents for evaluation of abdominal pain with nausea.  She reports that the symptoms started around noon today (12+ hours ago) and have gradually gotten worse over the course of the day.  It is now radiating into her back and is primarily in the middle part of her abdomen.  She said that she had a lot of output from her colostomy bag in the morning but has had less output since then but she has also not been eating much today.  She has been nauseated but has had no vomiting.  She said that she took one of her nausea pills at home and that seemed to have helped.  Denies chest pain, shortness of breath, and fever.     Physical Exam   Triage Vital Signs: ED Triage Vitals  Encounter Vitals Group     BP 05/04/24 0002 132/66     Girls Systolic BP Percentile --      Girls Diastolic BP Percentile --      Boys Systolic BP Percentile --      Boys Diastolic BP Percentile --      Pulse Rate 05/04/24 0002 (!) 101     Resp 05/04/24 0002 18     Temp 05/04/24 0002 99.1 F (37.3 C)     Temp Source 05/04/24 0002 Oral     SpO2 05/04/24 0002 100 %     Weight --      Height --      Head Circumference --      Peak Flow --      Pain Score 05/04/24 0004 8     Pain Loc --      Pain Education --      Exclude from Growth Chart --     Most recent vital signs: Vitals:   05/04/24 0057 05/04/24 0505  BP:  123/63  Pulse: 99 (!) 15  Resp: (!) 24 19  Temp:  98.5 F (36.9 C)  SpO2: 99% 99%    General: Awake, appears uncomfortable and ill though nontoxic.  Alert and oriented. CV:  Good peripheral perfusion.  Mild tachycardia,  regular rhythm, normal heart sounds. Resp:  Normal effort. Speaking easily and comfortably, no accessory muscle usage nor intercostal retractions.  Lungs clear to auscultation bilaterally. Abd:  Obese.  Soft abdomen but with tenderness to palpation in the middle of her abdomen, not peritoneal but at least moderately tender.  Colostomy output is on the left lower part of her abdomen and appears normal although there is relatively minimal output in the bag.   ED Results / Procedures / Treatments   Labs (all labs ordered are listed, but only abnormal results are displayed) Labs Reviewed  COMPREHENSIVE METABOLIC PANEL WITH GFR - Abnormal; Notable for the following components:      Result Value   Glucose, Bld 161 (*)    All other components within normal limits  CBC - Abnormal; Notable for the following components:   RBC 3.46 (*)    Hemoglobin 11.7 (*)    HCT 34.8 (*)    MCV  100.6 (*)    RDW 16.7 (*)    All other components within normal limits  URINALYSIS, ROUTINE W REFLEX MICROSCOPIC - Abnormal; Notable for the following components:   Color, Urine YELLOW (*)    APPearance CLEAR (*)    Specific Gravity, Urine 1.044 (*)    All other components within normal limits  LACTIC ACID, PLASMA - Abnormal; Notable for the following components:   Lactic Acid, Venous 3.5 (*)    All other components within normal limits  LIPASE, BLOOD  LACTIC ACID, PLASMA  COMPREHENSIVE METABOLIC PANEL WITH GFR     RADIOLOGY See ED course for details   PROCEDURES:  Critical Care performed: No  Procedures    IMPRESSION / MDM / ASSESSMENT AND PLAN / ED COURSE  I reviewed the triage vital signs and the nursing notes.                              Differential diagnosis includes, but is not limited to, bowel obstruction, ileus, enteritis or colitis, recurrence of neoplasm, diverticulitis.  Patient's presentation is most consistent with acute presentation with potential threat to life or bodily  function.  Labs/studies ordered: CMP, lipase, CBC, urinalysis, CT of the abdomen and pelvis, lactic acid  Interventions/Medications given:  Medications  megestrol  (MEGACE ) tablet 40 mg (has no administration in time range)  simvastatin  (ZOCOR ) tablet 40 mg (has no administration in time range)  triamterene -hydrochlorothiazide  (MAXZIDE -25) 37.5-25 MG per tablet 1 tablet (has no administration in time range)  prochlorperazine  (COMPAZINE ) tablet 10 mg (has no administration in time range)  diphenoxylate -atropine  (LOMOTIL ) 2.5-0.025 MG per tablet 1 tablet (has no administration in time range)  tiZANidine  (ZANAFLEX ) tablet 2 mg (has no administration in time range)  cholecalciferol  (VITAMIN D3) 25 MCG (1000 UNIT) tablet 2,000 Units (has no administration in time range)  magnesium  chloride (SLOW-MAG) 64 MG SR tablet 64 mg (has no administration in time range)  multivitamin with minerals tablet 1 tablet (has no administration in time range)  potassium chloride  SA (KLOR-CON  M) CR tablet 20 mEq (has no administration in time range)  zinc  sulfate (50mg  elemental zinc ) capsule 220 mg (has no administration in time range)  fluticasone  (FLONASE ) 50 MCG/ACT nasal spray 1 spray (has no administration in time range)  loratadine  (CLARITIN ) tablet 10 mg (has no administration in time range)  diclofenac  Sodium (VOLTAREN ) 1 % topical gel 2 g (has no administration in time range)  enoxaparin  (LOVENOX ) injection 40 mg (has no administration in time range)  0.9 % NaCl with KCl 20 mEq/ L  infusion ( Intravenous New Bag/Given 05/04/24 0508)  acetaminophen  (TYLENOL ) tablet 650 mg (has no administration in time range)    Or  acetaminophen  (TYLENOL ) suppository 650 mg (has no administration in time range)  traZODone  (DESYREL ) tablet 25 mg (has no administration in time range)  ondansetron  (ZOFRAN ) tablet 4 mg (has no administration in time range)    Or  ondansetron  (ZOFRAN ) injection 4 mg (has no administration in  time range)  morphine  (PF) 2 MG/ML injection 2 mg (has no administration in time range)  morphine  (PF) 4 MG/ML injection 4 mg (4 mg Intravenous Given 05/04/24 0128)  ondansetron  (ZOFRAN ) injection 4 mg (4 mg Intravenous Given 05/04/24 0128)  lactated ringers  bolus 1,000 mL (0 mLs Intravenous Stopped 05/04/24 0313)  iohexol  (OMNIPAQUE ) 300 MG/ML solution 100 mL (100 mLs Intravenous Contrast Given 05/04/24 0147)    (Note:  hospital course my include  additional interventions and/or labs/studies not listed above.)   Vital signs are generally reassuring although she is mildly tachycardic and her pain is leading her to be slightly tachypneic.  I ordered morphine  4 mg IV and Zofran  4 mg IV.  CMP and CBC are both stable.  Ordering lactic acid, ordered a liter bolus of LR, proceeding with CT scan   Clinical Course as of 05/04/24 0658  Sun May 04, 2024  0205 Lactic Acid, Venous: 1.8 [CF]  0235 CT ABDOMEN PELVIS W CONTRAST I received a phone call for Dr. Dwane to let me know that the patient has a partial small bowel obstruction with no closed-loop obstruction nor clear transition point.  I also independently viewed and interpreted the patient's CT scan and I can see the area he is talking about. [CF]  0236 I updated the patient and she is feeling much better.  No persistent nausea or vomiting.  I will hold off on NG tube which she agrees with but the patient does need admission for further management of his partial small bowel obstruction.  There is no indication to emergently call and wake up the surgeon at this time.I am consulting the hospitalist team for admission.   [CF]  0311 I consulted by phone with the admitting hospitalist, and they will admit the patient - Dr. Lawence [CF]    Clinical Course User Index [CF] Gordan Huxley, MD     FINAL CLINICAL IMPRESSION(S) / ED DIAGNOSES   Final diagnoses:  Partial small bowel obstruction (HCC)  Immunosuppressed due to chemotherapy [facility])     Rx /  DC Orders   ED Discharge Orders     None        Note:  This document was prepared using Dragon voice recognition software and may include unintentional dictation errors.   Gordan Huxley, MD 05/04/24 9341    Gordan Huxley, MD 05/04/24 [phone]

## 2024-05-04 NOTE — ED Triage Notes (Signed)
 Pt reports epigastric pain and nausea that began today around noon. Pt has colostomy and denies constipation and diarrhea.

## 2024-05-04 NOTE — ED Notes (Addendum)
 Ostomy bag changed d/t leakage. Changed to pt specifications from her home routine. Full linen change and gown provided.Pt then able to ambulate with 1 assist to bathroom to urinate.

## 2024-05-04 NOTE — Assessment & Plan Note (Signed)
Will continue antihypertensive therapy.

## 2024-05-04 NOTE — H&P (Addendum)
 Jacqueline Yoder   PATIENT NAME: Jacqueline Yoder    MR#:  [id number]  DATE OF BIRTH:  05/23/1946  DATE OF ADMISSION:  05/04/2024  PRIMARY CARE PHYSICIAN: Edman Marsa PARAS, DO   Patient is coming from: Home  REQUESTING/REFERRING PHYSICIAN: Gordan Huxley, MD  CHIEF COMPLAINT:   Chief Complaint  Patient presents with   Abdominal Pain    HISTORY OF PRESENT ILLNESS:  Jacqueline Yoder is a 78 y.o. female with medical history significant for GERD, hypertension, dyslipidemia, rectal cancer with recent metastasis to the lungs and bones, who presented to the emergency room with acute onset of epigastric abdominal pain with associated nausea without vomiting.  The patient denied any diarrhea or constipation.  She had chills without measured fever.  No cough or wheezing or dyspnea.  No chest pain or palpitations.  No dysuria, oliguria or hematuria or flank pain.  No bleeding diathesis.  The patient received her last chemotherapy session on Thursday.  ED Course: When the patient came to the ER, the patient was 99.1 with heart rate of 101 and otherwise normal vitals.  CMP was remarkable for blood glucose of 161.  Lactic acid was 1.8 and later 3.5.  CBC showed hemoglobin 11.7 hematocrit 34.8 better than previous levels.  UA showed elevated urine specific gravity of 1044. EKG as reviewed by me : None Imaging: Abdominal and pelvic CT scan with contrast revealed the following: 1. Findings consistent with a distal partial small bowel obstruction. 2. Evidence of prior abdominoperineal resection with a left lower quadrant ostomy site. 3. 5.0 cm x 6.3 cm parastomal hernia which contains fat and a segment of large bowel. 4. Colonic diverticulosis. 5. Multiple subcentimeter pulmonary nodules within the left upper lobe right middle lobe and bilateral lower lobes, as described above. 6. Stable 15 mm x 14 mm right middle lobe pulmonary nodule. 7. Hepatic steatosis. 8. Evidence of prior  cholecystectomy and hysterectomy. 9. Aortic atherosclerosis.  The patient was given 1 L bolus of IV lactated Ringer , 4 mg of IV morphine  sulfate and 4 mg of IV Zofran .  She will be admitted to a medical-surgical bed for further evaluation and management. PAST MEDICAL HISTORY:   Past Medical History:  Diagnosis Date   Allergy    Arthritis    Blood clot in vein    Family history of colon cancer    GERD (gastroesophageal reflux disease)    Hypercholesteremia    Hypertension    Hypertension    Lower extremity edema    Personal history of chemotherapy    Rectal cancer (HCC) 12/2018   Urinary incontinence     PAST SURGICAL HISTORY:   Past Surgical History:  Procedure Laterality Date   ABDOMINAL HYSTERECTOMY     CHOLECYSTECTOMY  1971   COLONOSCOPY WITH PROPOFOL  N/A 12/03/2018   Procedure: COLONOSCOPY WITH PROPOFOL ;  Surgeon: Jinny Carmine, MD;  Location: [facility] ENDOSCOPY;  Service: Endoscopy;  Laterality: N/A;   COLONOSCOPY WITH PROPOFOL  N/A 07/15/2020   Procedure: COLONOSCOPY WITH PROPOFOL ;  Surgeon: Therisa Bi, MD;  Location: [facility] ENDOSCOPY;  Service: Gastroenterology;  Laterality: N/A;   FLEXIBLE SIGMOIDOSCOPY N/A 12/06/2018   Procedure: FLEXIBLE SIGMOIDOSCOPY;  Surgeon: Therisa Bi, MD;  Location: [facility] ENDOSCOPY;  Service: Endoscopy;  Laterality: N/A;   LAPAROSCOPIC COLOSTOMY  01/06/2019   PORTACATH PLACEMENT N/A 04/03/2019   Procedure: INSERTION PORT-A-CATH;  Surgeon: Jordis Laneta JULIANNA, MD;  Location: [facility] ORS;  Service: General;  Laterality: N/A;    SOCIAL HISTORY:   Social  History   Tobacco Use   Smoking status: Former    Current packs/day: 0.00    Types: Cigarettes    Quit date: 12/02/1977    Years since quitting: 46.4   Smokeless tobacco: Former  Substance Use Topics   Alcohol use: Never    FAMILY HISTORY:   Family History  Problem Relation Age of Onset   Colon cancer Brother 28       exposure to chemicals [place]   Hypertension Mother    Stroke Mother    Kidney  failure Father    Breast cancer Neg Hx    Ovarian cancer Neg Hx     DRUG ALLERGIES:   Allergies  Allergen Reactions   Sulfamethoxazole-Trimethoprim Other (See Comments)    Also said she had chills and pressure in nose.    REVIEW OF SYSTEMS:   ROS As per history of present illness. All pertinent systems were reviewed above. Constitutional, HEENT, cardiovascular, respiratory, GI, GU, musculoskeletal, neuro, psychiatric, endocrine, integumentary and hematologic systems were reviewed and are otherwise negative/unremarkable except for positive findings mentioned above in the HPI.   MEDICATIONS AT HOME:   Prior to Admission medications   Medication Sig Start Date End Date Taking? Authorizing Provider  acetaminophen  (TYLENOL ) 650 MG CR tablet Take 650 mg by mouth every 8 (eight) hours as needed for pain.    [provider]  apixaban  (ELIQUIS ) 2.5 MG TABS tablet Take 1 tablet (2.5 mg total) by mouth 2 (two) times daily. 02/14/24   Babara Call, MD  Cholecalciferol  (VITAMIN D3) 2000 units capsule Take 2,000 Units by mouth daily.    [provider]  diclofenac  sodium (VOLTAREN ) 1 % GEL Apply 2 g topically 4 (four) times daily as needed (joint pain). 05/09/18   [provider]  diphenoxylate -atropine  (LOMOTIL ) 2.5-0.025 MG tablet Take 1 tablet by mouth 4 (four) times daily as needed for diarrhea or loose stools. 09/11/23   Babara Call, MD  fluticasone  (FLONASE ) 50 MCG/ACT nasal spray Place 1 spray into both nostrils daily as needed for allergies or rhinitis. 09/20/23   Barbarann Nest, MD  lidocaine -prilocaine  (EMLA ) cream Apply 1 Application topically daily as needed. Apply small amount to port and cover with saran wrap 1-2 hours prior to port access 12/04/23   Babara Call, MD  loratadine  (CLARITIN ) 10 MG tablet Take 10 mg by mouth daily.    [provider]  magnesium  chloride (SLOW-MAG) 64 MG TBEC SR tablet Take 1 tablet (64 mg total) by mouth 2 (two) times daily. 03/13/23    Babara Call, MD  megestrol  (MEGACE ) 40 MG tablet Take 1 tablet (40 mg total) by mouth 2 (two) times daily. 04/06/24   Babara Call, MD  Multiple Vitamins-Minerals (ONE-A-DAY WOMENS 50 PLUS PO) Take 1 tablet by mouth daily.     [provider]  ondansetron  (ZOFRAN ) 8 MG tablet Take 1 tablet (8 mg total) by mouth every 8 (eight) hours as needed for nausea or vomiting. 04/04/24   Babara Call, MD  potassium chloride  SA (KLOR-CON  M) 20 MEQ tablet TAKE 1 TABLET BY MOUTH DAILY 02/19/24   Babara Call, MD  prochlorperazine  (COMPAZINE ) 10 MG tablet Take 1 tablet (10 mg total) by mouth every 6 (six) hours as needed (NAUSEA). 12/13/23   Babara Call, MD  simvastatin  (ZOCOR ) 40 MG tablet Take 1 tablet (40 mg total) by mouth at bedtime. 07/11/23   Karamalegos, Marsa PARAS, DO  tiZANidine  (ZANAFLEX ) 4 MG tablet Take 0.5 tablets (2 mg total) by mouth every  8 (eight) hours as needed for muscle spasms. 12/21/23   Antonette Angeline ORN, NP  triamterene -hydrochlorothiazide  (DYAZIDE ) 37.5-25 MG capsule TAKE 1 CAPSULE BY MOUTH EACH  DAILY 04/25/24   Edman, Marsa PARAS, DO  trifluridine -tipiracil  (LONSURF ) 20-8.19 MG tablet Take 3 tablets (60 mg of trifluridine  total) by mouth 2 (two) times daily. 1hr after AM & PM meals days 1-5, 8-12. Repeat every 28d. 04/04/24   Babara Call, MD  zinc  gluconate 50 MG tablet Take 50 mg by mouth daily.    [provider]      VITAL SIGNS:  Blood pressure 123/63, pulse (!) 15, temperature 98.5 F (36.9 C), temperature source Oral, resp. rate 19, SpO2 99%.  PHYSICAL EXAMINATION:  Physical Exam  GENERAL:  78 y.o.-year-old patient lying in the bed with no acute distress.  EYES: Pupils equal, round, reactive to light and accommodation. No scleral icterus. Extraocular muscles intact.  HEENT: Head atraumatic, normocephalic. Oropharynx and nasopharynx clear.  NECK:  Supple, no jugular venous distention. No thyroid  enlargement, no tenderness.  LUNGS: Normal breath sounds bilaterally, no wheezing,  rales,rhonchi or crepitation. No use of accessory muscles of respiration.  CARDIOVASCULAR: Regular rate and rhythm, S1, S2 normal. No murmurs, rubs, or gallops.  ABDOMEN: Soft, nondistended, with mild epigastric and periumbilical tenderness without rebound tenderness guarding or rigidity.. Bowel sounds are significant diminished. No organomegaly or mass.  Colostomy in place with brown stools without blood. EXTREMITIES: No pedal edema, cyanosis, or clubbing.  NEUROLOGIC: Cranial nerves II through XII are intact. Muscle strength 5/5 in all extremities. Sensation intact. Gait not checked.  PSYCHIATRIC: The patient is alert and oriented x 3.  Normal affect and good eye contact. SKIN: No obvious rash, lesion, or ulcer.   LABORATORY PANEL:   CBC Recent Labs  Lab 05/04/24 0015  WBC 9.7  HGB 11.7*  HCT 34.8*  PLT 189   ------------------------------------------------------------------------------------------------------------------  Chemistries  Recent Labs  Lab 05/04/24 0015  NA 137  K 3.6  CL 103  CO2 23  GLUCOSE 161*  BUN 21  CREATININE 0.76  CALCIUM  10.1  AST 36  ALT 22  ALKPHOS 104  BILITOT 0.9   ------------------------------------------------------------------------------------------------------------------  Cardiac Enzymes No results for input(s): TROPONINI in the last 168 hours. ------------------------------------------------------------------------------------------------------------------  RADIOLOGY:  CT ABDOMEN PELVIS W CONTRAST Result Date: 05/04/2024 CLINICAL DATA:  Epigastric pain and nausea. EXAM: CT ABDOMEN AND PELVIS WITH CONTRAST TECHNIQUE: Multidetector CT imaging of the abdomen and pelvis was performed using the standard protocol following bolus administration of intravenous contrast. RADIATION DOSE REDUCTION: This exam was performed according to the departmental dose-optimization program which includes automated exposure control, adjustment of the mA  and/or kV according to patient size and/or use of iterative reconstruction technique. CONTRAST:  OMNIPAQUE  IOHEXOL  300 MG/ML  SOLN COMPARISON:  April 10, 2024 FINDINGS: Lower chest: Multiple subcentimeter pulmonary nodules are seen within the left upper lobe right middle lobe and bilateral lower lobes (the largest measures approximately 4 mm). A stable 15 mm x 14 mm pulmonary nodule is also seen within the anteromedial aspect of the right middle lobe. Hepatobiliary: There is diffuse fatty infiltration of the liver parenchyma. No focal liver abnormality is seen. Status post cholecystectomy. No biliary dilatation. Pancreas: Unremarkable. No pancreatic ductal dilatation or surrounding inflammatory changes. Spleen: Normal in size without focal abnormality. Adrenals/Urinary Tract: Adrenal glands are unremarkable. Kidneys are normal in size, without renal calculi or hydronephrosis. Multiple stable bilateral simple renal cysts are seen. Bladder is unremarkable. Stomach/Bowel: Stomach is within normal limits.  Appendix appears normal. Mildly dilated small bowel loops are seen within the mid and lower abdomen (maximum small bowel diameter of approximately 3.2 cm). A transition zone is suspected within the pelvis. There is evidence of prior abdominoperineal resection with a left lower quadrant ostomy site noted. Noninflamed diverticula are seen within the remaining visualized large bowel. There is a 5.0 cm x 6.3 cm parastomal hernia which contains fat and a segment of large bowel. Vascular/Lymphatic: Aortic atherosclerosis. No enlarged abdominal or pelvic lymph nodes. Reproductive: Status post hysterectomy. No adnexal masses. Other: No abdominopelvic ascites. Musculoskeletal: Multilevel degenerative changes are seen throughout the lumbar spine. IMPRESSION: 1. Findings consistent with a distal partial small bowel obstruction. 2. Evidence of prior abdominoperineal resection with a left lower quadrant ostomy site. 3. 5.0 cm  x 6.3 cm parastomal hernia which contains fat and a segment of large bowel. 4. Colonic diverticulosis. 5. Multiple subcentimeter pulmonary nodules within the left upper lobe right middle lobe and bilateral lower lobes, as described above. 6. Stable 15 mm x 14 mm right middle lobe pulmonary nodule. 7. Hepatic steatosis. 8. Evidence of prior cholecystectomy and hysterectomy. 9. Aortic atherosclerosis. Electronically Signed   By: Suzen Dials M.D.   On: 05/04/2024 02:18      IMPRESSION AND PLAN:  Assessment and Plan: * Partial small bowel obstruction (HCC) - The patient be admitted to a medical-surgical bed. - Should be kept n.p.o. except for medications. - At this time she did not have any recurrent vomiting to require NG tube placement. - Will repeat two-view abdomen x-ray in AM. - General Surgery consult will be obtained. - I notified Dr. Desiderio about the patient. - Will repeat lactic acid level given its elevation.  Colon cancer metastasized to lung [facility]) - She has lung and bones metastasis. - Pain management will be provided. - The patient is undergoing active chemotherapy.  Dyslipidemia - We will continue statin therapy.  Essential hypertension - Will continue antihypertensive therapy.   DVT prophylaxis: Lovenox . Advanced Care Planning:  Code Status: The patient is DNR and DNI.  This was discussed with her. Family Communication:  The plan of care was discussed in details with the patient (and family). I answered all questions. The patient agreed to proceed with the above mentioned plan. Further management will depend upon hospital course. Disposition Plan: Back to previous home environment Consults called: General Surgery. All the records are reviewed and case discussed with ED provider.  Status is: Inpatient  At the time of the admission, it appears that the appropriate admission status for this patient is inpatient.  This is judged to be reasonable and necessary in  order to provide the required intensity of service to ensure the patient's safety given the presenting symptoms, physical exam findings and initial radiographic and laboratory data in the context of comorbid conditions.  The patient requires inpatient status due to high intensity of service, high risk of further deterioration and high frequency of surveillance required.  I certify that at the time of admission, it is my clinical judgment that the patient will require inpatient hospital care extending more than 2 midnights.                            Dispo: The patient is from: Home              Anticipated d/c is to: Home              Patient  currently is not medically stable to d/c.              Difficult to place patient: No  Madison DELENA Peaches M.D on 05/04/2024 at 5:32 AM  [facility]   From 7 PM-7 AM, contact night-coverage [email or web address]  CC: Primary care physician; Edman Marsa PARAS, DO

## 2024-05-05 DIAGNOSIS — K56600 Partial intestinal obstruction, unspecified as to cause: Secondary | ICD-10-CM | POA: Diagnosis not present

## 2024-05-05 DIAGNOSIS — K56609 Unspecified intestinal obstruction, unspecified as to partial versus complete obstruction: Secondary | ICD-10-CM | POA: Diagnosis present

## 2024-05-05 DIAGNOSIS — L8990 Pressure ulcer of unspecified site, unspecified stage: Secondary | ICD-10-CM | POA: Insufficient documentation

## 2024-05-05 LAB — CBC WITH DIFFERENTIAL/PLATELET
Abs Immature Granulocytes: 0 K/uL (ref 0.00–0.07)
Basophils Absolute: 0 K/uL (ref 0.0–0.1)
Basophils Relative: 1 %
Eosinophils Absolute: 0.1 K/uL (ref 0.0–0.5)
Eosinophils Relative: 2 %
HCT: 28.8 % — ABNORMAL LOW (ref 36.0–46.0)
Hemoglobin: 9.6 g/dL — ABNORMAL LOW (ref 12.0–15.0)
Immature Granulocytes: 0 %
Lymphocytes Relative: 20 %
Lymphs Abs: 0.7 K/uL (ref 0.7–4.0)
MCH: 34.2 pg — ABNORMAL HIGH (ref 26.0–34.0)
MCHC: 33.3 g/dL (ref 30.0–36.0)
MCV: 102.5 fL — ABNORMAL HIGH (ref 80.0–100.0)
Monocytes Absolute: 0.5 K/uL (ref 0.1–1.0)
Monocytes Relative: 14 %
Neutro Abs: 2.2 K/uL (ref 1.7–7.7)
Neutrophils Relative %: 63 %
Platelets: 150 K/uL (ref 150–400)
RBC: 2.81 MIL/uL — ABNORMAL LOW (ref 3.87–5.11)
RDW: 16.9 % — ABNORMAL HIGH (ref 11.5–15.5)
Smear Review: NORMAL
WBC: 3.4 K/uL — ABNORMAL LOW (ref 4.0–10.5)
nRBC: 0 % (ref 0.0–0.2)

## 2024-05-05 LAB — BASIC METABOLIC PANEL WITH GFR
Anion gap: 10 (ref 5–15)
BUN: 15 mg/dL (ref 8–23)
CO2: 23 mmol/L (ref 22–32)
Calcium: 8.9 mg/dL (ref 8.9–10.3)
Chloride: 105 mmol/L (ref 98–111)
Creatinine, Ser: 0.83 mg/dL (ref 0.44–1.00)
GFR, Estimated: >60 mL/min (ref >60)
Glucose, Bld: 96 mg/dL (ref 70–99)
Potassium: 3.3 mmol/L — ABNORMAL LOW (ref 3.5–5.1)
Sodium: 138 mmol/L (ref 135–145)

## 2024-05-05 MED ORDER — POTASSIUM CHLORIDE CRYS ER 20 MEQ PO TBCR
40.0000 meq | EXTENDED_RELEASE_TABLET | Freq: Once | ORAL | Status: AC
  Administered 2024-05-05: 40 meq via ORAL
  Filled 2024-05-05: qty 2

## 2024-05-05 MED ORDER — HEPARIN SOD (PORK) LOCK FLUSH 100 UNIT/ML IV SOLN
500.0000 [IU] | Freq: Once | INTRAVENOUS | Status: DC
  Filled 2024-05-05: qty 5

## 2024-05-05 NOTE — Plan of Care (Signed)
  Problem: Clinical Measurements: Goal: Ability to maintain clinical measurements within normal limits will improve Outcome: Progressing   Problem: Clinical Measurements: Goal: Diagnostic test results will improve Outcome: Progressing   Problem: Clinical Measurements: Goal: Respiratory complications will improve Outcome: Progressing   Problem: Clinical Measurements: Goal: Cardiovascular complication will be avoided Outcome: Progressing   Problem: Safety: Goal: Ability to remain free from injury will improve Outcome: Progressing   Problem: Skin Integrity: Goal: Risk for impaired skin integrity will decrease Outcome: Progressing

## 2024-05-05 NOTE — Hospital Course (Signed)
 78yo with h/o HTN, HLD, and rectal cancer with lung and bony metastases who presented on 7/20 with abdominal pain and was found to have partial distal SBO.  No vomiting so did not require NG tube.  Surgery is consulted.  Appears to have resolved spontaneously and she is passing flatus and stool and tolerating a diet.

## 2024-05-05 NOTE — Progress Notes (Signed)
 05/05/2024  Subjective: Patient reports having a lot of issues overnight with the ostomy bag overflowing and leaking, having a few accidents.  Denies any worsening abdominal pain or nausea.  Her ostomy bag was changed overnight.  Vital signs: Temp:  [98.5 F (36.9 C)-99.4 F (37.4 C)] 98.8 F (37.1 C) (07/21 0423) Pulse Rate:  [79-105] 93 (07/21 0423) Resp:  [16-28] 19 (07/20 2000) BP: (92-141)/(49-83) 102/49 (07/21 0423) SpO2:  [95 %-100 %] 97 % (07/21 0423)   Intake/Output: 07/20 0701 - 07/21 0700 In: -  Out: 450 [Stool:450] Last BM Date : (P) 05/05/24  Physical Exam: Constitutional: No acute distress Abdomen:  soft, non-distended, non-tender.  Ostomy with liquid stool.  Labs:  Recent Labs    05/04/24 0015  WBC 9.7  HGB 11.7*  HCT 34.8*  PLT 189   Recent Labs    05/04/24 0015 05/04/24 0634  NA 137 136  K 3.6 3.9  CL 103 101  CO2 23 26  GLUCOSE 161* 132*  BUN 21 19  CREATININE 0.76 0.72  CALCIUM  10.1 9.5   No results for input(s): LABPROT, INR in the last 72 hours.  Imaging: DG Abd 2 Views Result Date: 05/04/2024 CLINICAL DATA:  Small-bowel obstruction follow-up. History of metastatic rectal cancer. EXAM: ABDOMEN - 2 VIEW COMPARISON:  CT AP from 05/04/2024 FINDINGS: Persistent dilated loops of bowel noted within the lower abdomen and pelvis measuring up to 3.9 cm corresponding to the bowel obstruction from earlier today. No signs of pneumoperitoneum. Visualized osseous structures appear grossly intact. IV contrast material from recent CT is noted within renal the collecting systems and bladder. IMPRESSION: Persistent dilated loops of bowel within the lower abdomen and pelvis corresponding to the bowel obstruction from earlier today. Electronically Signed   By: Waddell Calk M.D.   On: 05/04/2024 09:18    Assessment/Plan: This is a 78 y.o. female with SBO  --Discussed with the patient that after SBO episodes are resolving, it's normal to have very loose  stools at first as all the fluid in the intestines now comes out through the ostomy.  As she's eating a diet, the stool will bulk up more. --From surgery standpoint, she could be discharged home, but she's now hesitant considering possible further accidents with ostomy bag leaking. --No follow up needed from surgery standpoint.   I spent 35 minutes dedicated to the care of this patient on the date of this encounter to include pre-visit review of records, face-to-face time with the patient discussing diagnosis and management, and any post-visit coordination of care.  Aloysius Sheree Plant, MD [facility]

## 2024-05-05 NOTE — Discharge Summary (Addendum)
 Physician Discharge Summary   Patient: Jacqueline Yoder MRN: [id number] DOB: 22-Jan-1946  Admit date:     05/04/2024  Discharge date: 05/05/24  Discharge Physician: Delon Herald   PCP: Edman Marsa PARAS, DO   Recommendations at discharge:   Advance diet as tolerated Follow up with Dr. Edman in 1-2 weeks Follow up with Dr. Babara as scheduled  Discharge Diagnoses: Principal Problem:   Partial small bowel obstruction [facility]) Active Problems:   Dyslipidemia   Colon cancer metastasized to lung [facility])   Essential hypertension   DNR (do not resuscitate)    Hospital Course: 78yo with h/o HTN, HLD, and rectal cancer with lung and bony metastases who presented on 7/20 with abdominal pain and was found to have partial distal SBO.  No vomiting so did not require NG tube.  Surgery is consulted.  Appears to have resolved spontaneously and she is passing flatus and stool and tolerating a diet.   Assessment and Plan:  Partial small bowel obstruction Known h/o rectal cancer and prior SBO Presented with abdominal pain CT with partial SBO, parastomal hernia Admitted to med surg No recurrent vomiting to require NG tube placement General Surgery consulted, has signed off Lactic acidosis has cleared Obstruction appears to have cleared spontaneously She is tolerating a diet She is at high risk for ongoing issues and this has been clearly explained to the patient and her family She is having increased and loose stools through her ostomy, which is not unexpected after resolution of SBO   Colon cancer metastasized to lung, bone Diagnosed in 2020 but has progressed and is widely metastatic per recent imaging She is being treated with Lonsurf  and Bevacizumab  She is followed by Dr. Babara 6/26 imaging with concern for a new peritoneal or metachronous colon lesion noted, will be discussed at tumor board per family  Continue Megace , MVI Recommend outpatient palliative care involvement    Dyslipidemia Continue simvastatin     Essential hypertension Continue Dyazide    H/o DVT/PE Held Eliquis  due to possible need for surgery on presentation Will resume Eliquis  on 7/21, assuming no further issues  Pressure ulcer Has a chronic pressure ulcer under her pannus on the R Sees wound care clinic for this issue  DNR DNR confirmed at the time of admission [facility] documents reviewed        Consultants: Surgery   Procedures: None   Antibiotics: None    Pain control - [place]  Controlled Substance Reporting System database was reviewed. and patient was instructed, not to drive, operate heavy machinery, perform activities at heights, swimming or participation in water activities or provide baby-sitting services while on Pain, Sleep and Anxiety Medications; until their outpatient Physician has advised to do so again. Also recommended to not to take more than prescribed Pain, Sleep and Anxiety Medications.   Disposition: Home Diet recommendation:  Regular diet DISCHARGE MEDICATION: Allergies as of 05/05/2024       Reactions   Sulfamethoxazole-trimethoprim Other (See Comments)   Also said she had chills and pressure in nose.        Medication List     STOP taking these medications    diclofenac  sodium 1 % Gel Commonly known as: VOLTAREN    prochlorperazine  10 MG tablet Commonly known as: COMPAZINE        TAKE these medications    acetaminophen  650 MG CR tablet Commonly known as: TYLENOL  Take 650 mg by mouth every 8 (eight) hours as needed for pain.   apixaban  2.5 MG Tabs tablet Commonly  known as: Eliquis  Take 1 tablet (2.5 mg total) by mouth 2 (two) times daily.   diphenoxylate -atropine  2.5-0.025 MG tablet Commonly known as: LOMOTIL  Take 1 tablet by mouth 4 (four) times daily as needed for diarrhea or loose stools.   fluticasone  50 MCG/ACT nasal spray Commonly known as: FLONASE  Place 1 spray into both nostrils daily as needed for allergies or  rhinitis.   ipratropium 0.03 % nasal spray Commonly known as: ATROVENT  Place 2 sprays into both nostrils 2 (two) times daily.   lidocaine -prilocaine  cream Commonly known as: EMLA  Apply 1 Application topically daily as needed. Apply small amount to port and cover with saran wrap 1-2 hours prior to port access   Lonsurf  20-8.19 MG tablet Generic drug: trifluridine -tipiracil  Take 3 tablets (60 mg of trifluridine  total) by mouth 2 (two) times daily. 1hr after AM & PM meals days 1-5, 8-12. Repeat every 28d.   loratadine  10 MG tablet Commonly known as: CLARITIN  Take 10 mg by mouth daily.   magnesium  chloride 64 MG Tbec SR tablet Commonly known as: SLOW-MAG Take 1 tablet (64 mg total) by mouth 2 (two) times daily.   megestrol  40 MG/ML suspension Commonly known as: MEGACE  Take 40 mg by mouth 2 (two) times daily. What changed: Another medication with the same name was removed. Continue taking this medication, and follow the directions you see here.   nystatin  powder Commonly known as: MYCOSTATIN /NYSTOP  Apply 1 Application topically daily.   ondansetron  8 MG tablet Commonly known as: ZOFRAN  Take 1 tablet (8 mg total) by mouth every 8 (eight) hours as needed for nausea or vomiting.   ONE-A-DAY WOMENS 50 PLUS PO Take 1 tablet by mouth daily.   potassium chloride  SA 20 MEQ tablet Commonly known as: KLOR-CON  M TAKE 1 TABLET BY MOUTH DAILY What changed: when to take this   simvastatin  40 MG tablet Commonly known as: ZOCOR  Take 1 tablet (40 mg total) by mouth at bedtime.   tiZANidine  4 MG tablet Commonly known as: Zanaflex  Take 0.5 tablets (2 mg total) by mouth every 8 (eight) hours as needed for muscle spasms.   triamterene -hydrochlorothiazide  37.5-25 MG capsule Commonly known as: DYAZIDE  TAKE 1 CAPSULE BY MOUTH EACH  DAILY   Vitamin D3 50 MCG (2000 UT) capsule Take 2,000 Units by mouth daily.   zinc  gluconate 50 MG tablet Take 50 mg by mouth daily.                Discharge Care Instructions  (From admission, onward)           Start     Ordered   05/05/24 0000  Discharge wound care:       Comments: Referred to [facility] for ongoing ostomy care; also followed by wound care   05/05/24 1258            Discharge Exam:   Subjective: She is having significant ostomy drainage and is concerned about this.  Otherwise doing well with minimal pain, tolerating a diet.  I spoke with her daughter.  She tried to get up set up with the ostomy clinic at [facility]; she spoke with their new patient coordinator and she will need a referral.     Objective: Vitals:   05/04/24 2051 05/05/24 0423  BP: 121/60 (!) 102/49  Pulse: 99 93  Resp:    Temp: 99.4 F (37.4 C) 98.8 F (37.1 C)  SpO2: 98% 97%    Intake/Output Summary (Last 24 hours) at 05/05/2024 1258 Last  data filed at 05/05/2024 0422 Gross per 24 hour  Intake --  Output 450 ml  Net -450 ml   There were no vitals filed for this visit.  Exam:  General:  Appears calm and comfortable and is in NAD Eyes:  normal lids, iris ENT:  grossly normal hearing, lips & tongue, mmm Cardiovascular:  RRR. No LE edema.  Respiratory:   CTA bilaterally with no wheezes/rales/rhonchi.  Normal respiratory effort. Abdomen:  soft, NT, ND; large R-sided pannus with chronic skin wound along the base; ostomy in place with copious brown stool (during subsequent visit, she had frequent recurrent flatulence from her ostomy) Skin:  no rash or induration seen on limited exam Musculoskeletal:  grossly normal tone BUE/BLE, good ROM, no bony abnormality Psychiatric:  grossly normal mood and affect, speech fluent and appropriate, AOx3 Neurologic:  CN 2-12 grossly intact, moves all extremities in coordinated fashion  Data Reviewed: I have reviewed the patient's lab results since admission.  Pertinent labs for today include:   K+ 3.3, repleted WBC 3.4 Hgb 9.6    Condition at discharge:  improving  The results of significant diagnostics from this hospitalization (including imaging, microbiology, ancillary and laboratory) are listed below for reference.   Imaging Studies: DG Abd 2 Views Result Date: 05/04/2024 CLINICAL DATA:  Small-bowel obstruction follow-up. History of metastatic rectal cancer. EXAM: ABDOMEN - 2 VIEW COMPARISON:  CT AP from 05/04/2024 FINDINGS: Persistent dilated loops of bowel noted within the lower abdomen and pelvis measuring up to 3.9 cm corresponding to the bowel obstruction from earlier today. No signs of pneumoperitoneum. Visualized osseous structures appear grossly intact. IV contrast material from recent CT is noted within renal the collecting systems and bladder. IMPRESSION: Persistent dilated loops of bowel within the lower abdomen and pelvis corresponding to the bowel obstruction from earlier today. Electronically Signed   By: Waddell Calk M.D.   On: 05/04/2024 09:18   CT ABDOMEN PELVIS W CONTRAST Result Date: 05/04/2024 CLINICAL DATA:  Epigastric pain and nausea. EXAM: CT ABDOMEN AND PELVIS WITH CONTRAST TECHNIQUE: Multidetector CT imaging of the abdomen and pelvis was performed using the standard protocol following bolus administration of intravenous contrast. RADIATION DOSE REDUCTION: This exam was performed according to the departmental dose-optimization program which includes automated exposure control, adjustment of the mA and/or kV according to patient size and/or use of iterative reconstruction technique. CONTRAST:  OMNIPAQUE  IOHEXOL  300 MG/ML  SOLN COMPARISON:  April 10, 2024 FINDINGS: Lower chest: Multiple subcentimeter pulmonary nodules are seen within the left upper lobe right middle lobe and bilateral lower lobes (the largest measures approximately 4 mm). A stable 15 mm x 14 mm pulmonary nodule is also seen within the anteromedial aspect of the right middle lobe. Hepatobiliary: There is diffuse fatty infiltration of the liver parenchyma. No  focal liver abnormality is seen. Status post cholecystectomy. No biliary dilatation. Pancreas: Unremarkable. No pancreatic ductal dilatation or surrounding inflammatory changes. Spleen: Normal in size without focal abnormality. Adrenals/Urinary Tract: Adrenal glands are unremarkable. Kidneys are normal in size, without renal calculi or hydronephrosis. Multiple stable bilateral simple renal cysts are seen. Bladder is unremarkable. Stomach/Bowel: Stomach is within normal limits. Appendix appears normal. Mildly dilated small bowel loops are seen within the mid and lower abdomen (maximum small bowel diameter of approximately 3.2 cm). A transition zone is suspected within the pelvis. There is evidence of prior abdominoperineal resection with a left lower quadrant ostomy site noted. Noninflamed diverticula are seen within the remaining visualized large bowel. There is  a 5.0 cm x 6.3 cm parastomal hernia which contains fat and a segment of large bowel. Vascular/Lymphatic: Aortic atherosclerosis. No enlarged abdominal or pelvic lymph nodes. Reproductive: Status post hysterectomy. No adnexal masses. Other: No abdominopelvic ascites. Musculoskeletal: Multilevel degenerative changes are seen throughout the lumbar spine. IMPRESSION: 1. Findings consistent with a distal partial small bowel obstruction. 2. Evidence of prior abdominoperineal resection with a left lower quadrant ostomy site. 3. 5.0 cm x 6.3 cm parastomal hernia which contains fat and a segment of large bowel. 4. Colonic diverticulosis. 5. Multiple subcentimeter pulmonary nodules within the left upper lobe right middle lobe and bilateral lower lobes, as described above. 6. Stable 15 mm x 14 mm right middle lobe pulmonary nodule. 7. Hepatic steatosis. 8. Evidence of prior cholecystectomy and hysterectomy. 9. Aortic atherosclerosis. Electronically Signed   By: Suzen Dials M.D.   On: 05/04/2024 02:18   CT CHEST ABDOMEN PELVIS W CONTRAST Result Date:  04/16/2024 CLINICAL DATA:  Metastatic rectal cancer restaging * Tracking Code: BO * EXAM: CT CHEST, ABDOMEN, AND PELVIS WITH CONTRAST TECHNIQUE: Multidetector CT imaging of the chest, abdomen and pelvis was performed following the standard protocol during bolus administration of intravenous contrast. RADIATION DOSE REDUCTION: This exam was performed according to the departmental dose-optimization program which includes automated exposure control, adjustment of the mA and/or kV according to patient size and/or use of iterative reconstruction technique. CONTRAST:  85mL OMNIPAQUE  IOHEXOL  300 MG/ML  SOLN COMPARISON:  PET-CT, 01/14/2024 FINDINGS: CT CHEST FINDINGS Cardiovascular: Aortic atherosclerosis. Normal heart size. Left coronary artery calcifications. No pericardial effusion. Mediastinum/Nodes: No significant change in enlarged lower left cervical lymph node measuring 2.1 x 1.3 cm, previously FDG avid (series 2, image 6). Thyroid  gland, trachea, and esophagus demonstrate no significant findings. Lungs/Pleura: Numerous bilateral pulmonary nodules not significantly changed, largest index nodule of the medial segment right middle lobe measuring 1.5 x 1.4 cm (series 3, image 99), additional index nodule in the peripheral left upper lobe measuring 0.7 cm (series 3, image 40). No pleural effusion or pneumothorax. Musculoskeletal: No chest wall abnormality. No acute osseous findings. CT ABDOMEN PELVIS FINDINGS Hepatobiliary: No focal liver abnormality is seen. Status post cholecystectomy. No biliary dilatation. Pancreas: Unremarkable. No pancreatic ductal dilatation or surrounding inflammatory changes. Spleen: Normal in size without significant abnormality. Adrenals/Urinary Tract: Adrenal glands are unremarkable. Multiple benign bilateral renal cortical cysts, for which no further follow-up or characterization is required. Kidneys are otherwise normal, without renal calculi, solid lesion, or hydronephrosis. Bladder is  unremarkable. Stomach/Bowel: Stomach is within normal limits. Normal appendix. Status post abdominoperineal resection with left lower quadrant end colostomy. New, eccentric, nodular wall thickening of the colon at the hepatic flexure measuring 2.3 x 1.4 cm (series 2, image 69). Vascular/Lymphatic: Aortic atherosclerosis. Similar appearance of numerous matted, previously FDG avid retroperitoneal and pelvic lymph nodes, index left retroperitoneal node or conglomerate measuring 2.0 x 1.4 cm (series 2, image 66). Unchanged index right external iliac node measures 1.8 x 1.4 cm with matted soft tissue about the right common femoral artery likewise unchanged (series 2, image 78, 104). Reproductive: Status post hysterectomy. Other: Left lower quadrant end colostomy with a small parastomal hernia. Large abdominal pannus with skin thickening and nodularity of both the external surface and underlying fold, previously FDG avid (series 2, image 106). No ascites. Musculoskeletal: No acute osseous findings. Numerous sclerotic osseous metastases involving the axial and proximal appendicular skeleton, for example of the proximal right humerus, right clavicular head and left ilium CT appearance not significantly changed.  IMPRESSION: 1. Status post abdominoperineal resection with left lower quadrant end colostomy. 2. New, eccentric, nodular wall thickening of the colon at the hepatic flexure measuring 2.3 x 1.4 cm. This may be transient and peristaltic however a metastatic peritoneal implant or metachronous colon malignancy not excluded. Attention on follow-up. 3. Otherwise no significant change in previously FDG avid widespread pulmonary, nodal, and osseous metastatic disease, as well as soft tissue involvement of the right lower quadrant abdominal wall and pannus. 4. Coronary artery disease. Aortic Atherosclerosis (ICD10-I70.0). Electronically Signed   By: Marolyn JONETTA Jaksch M.D.   On: 04/16/2024 06:45    Microbiology: No results  found. However, due to the size of the patient record, not all encounters were searched. Please check Results Review for a complete set of results.  Labs: CBC: Recent Labs  Lab 05/01/24 0849 05/04/24 0015 05/05/24 0923  WBC 4.4 9.7 3.4*  NEUTROABS 3.4  --  2.2  HGB 10.6* 11.7* 9.6*  HCT 32.8* 34.8* 28.8*  MCV 101.9* 100.6* 102.5*  PLT 223 189 150   Basic Metabolic Panel: Recent Labs  Lab 05/01/24 0849 05/04/24 0015 05/04/24 0634 05/05/24 0923  NA 137 137 136 138  K 3.6 3.6 3.9 3.3*  CL 105 103 101 105  CO2 24 23 26 23   GLUCOSE 128* 161* 132* 96  BUN 23 21 19 15   CREATININE 0.75 0.76 0.72 0.83  CALCIUM  9.3 10.1 9.5 8.9   Liver Function Tests: Recent Labs  Lab 05/01/24 0849 05/04/24 0015 05/04/24 0634  AST 34 36 30  ALT 22 22 20   ALKPHOS 67 104 62  BILITOT 0.7 0.9 0.8  PROT 7.1 7.7 6.6  ALBUMIN 4.0 4.0 3.3*   CBG: No results for input(s): GLUCAP in the last 168 hours.  Discharge time spent: greater than 30 minutes.  Signed: Delon Herald, MD [facility] 05/05/2024

## 2024-05-06 ENCOUNTER — Encounter: Payer: Self-pay | Admitting: Oncology

## 2024-05-06 ENCOUNTER — Telehealth: Payer: Self-pay

## 2024-05-06 NOTE — Telephone Encounter (Signed)
 Dr. Babara recommends pt to be evaluated by [facility], please contact pt to schedule: Lab/ [facility]/ IVF

## 2024-05-06 NOTE — Transitions of Care (Post Inpatient/ED Visit) (Signed)
 05/06/2024  Name: Jacqueline Yoder MRN: [id number] DOB: 08/24/1946  Today's TOC FU Call Status: Today's TOC FU Call Status:: Successful TOC FU Call Completed TOC FU Call Complete Date: 05/06/24 Patient's Name and Date of Birth confirmed.  Transition Care Management Follow-up Telephone Call Date of Discharge: 05/05/24 Discharge Facility: [facility]) Type of Discharge: Inpatient Admission How have you been since you were released from the hospital?: Better Any questions or concerns?: No  Items Reviewed: Did you receive and understand the discharge instructions provided?: Yes Any new allergies since your discharge?: No Dietary orders reviewed?: Yes Do you have support at home?: Yes People in Home [RPT]: child(ren), adult  Medications Reviewed Today: Medications Reviewed Today     Reviewed by Emmitt Pan, LPN (Licensed Practical Nurse) on 05/06/24 at 1010  Med List Status: <None>   Medication Order Taking? Sig Documenting Provider Last Dose Status Informant  acetaminophen  (TYLENOL ) 650 MG CR tablet [id number] Yes Take 650 mg by mouth every 8 (eight) hours as needed for pain. [provider]  Active Self  apixaban  (ELIQUIS ) 2.5 MG TABS tablet [id number] Yes Take 1 tablet (2.5 mg total) by mouth 2 (two) times daily. Babara Call, MD  Active Self  Cholecalciferol  (VITAMIN D3) 2000 units capsule [id number] Yes Take 2,000 Units by mouth daily. [provider]  Active Self  diphenoxylate -atropine  (LOMOTIL ) 2.5-0.025 MG tablet [id number] Yes Take 1 tablet by mouth 4 (four) times daily as needed for diarrhea or loose stools. Babara Call, MD  Active Self  fluticasone  (FLONASE ) 50 MCG/ACT nasal spray [id number] Yes Place 1 spray into both nostrils daily as needed for allergies or rhinitis. Barbarann Nest, MD  Active Self  heparin  lock flush 100 UNIT/ML injection [id number]     Active   ipratropium (ATROVENT ) 0.03 % nasal spray [id number] Yes Place 2 sprays  into both nostrils 2 (two) times daily. [provider]  Active Self  lidocaine -prilocaine  (EMLA ) cream [id number] Yes Apply 1 Application topically daily as needed. Apply small amount to port and cover with saran wrap 1-2 hours prior to port access Babara Call, MD  Active Self  loratadine  (CLARITIN ) 10 MG tablet [id number] Yes Take 10 mg by mouth daily. [provider]  Active Self  magnesium  chloride (SLOW-MAG) 64 MG TBEC SR tablet [id number] Yes Take 1 tablet (64 mg total) by mouth 2 (two) times daily. Babara Call, MD  Active Self  megestrol  (MEGACE ) 40 MG/ML suspension [id number] Yes Take 40 mg by mouth 2 (two) times daily. [provider]  Active   Multiple Vitamins-Minerals (ONE-A-DAY WOMENS 50 PLUS PO) [id number] Yes Take 1 tablet by mouth daily.  [provider]  Active Self  nystatin  (MYCOSTATIN /NYSTOP ) powder [id number] Yes Apply 1 Application topically daily. [provider]  Active Self  ondansetron  (ZOFRAN ) 8 MG tablet [id number] Yes Take 1 tablet (8 mg total) by mouth every 8 (eight) hours as needed for nausea or vomiting. Babara Call, MD  Active Self  potassium chloride  SA (KLOR-CON  M) 20 MEQ tablet [id number] Yes TAKE 1 TABLET BY MOUTH DAILY  Patient taking differently: Take 20 mEq by mouth 2 (two) times daily.   Babara Call, MD  Active Self  simvastatin  (ZOCOR ) 40 MG tablet [id number] Yes Take 1 tablet (40 mg total) by mouth at bedtime. Edman Marsa PARAS, DO  Active Self  tiZANidine  (ZANAFLEX ) 4 MG tablet [id number]  Take 0.5 tablets (2 mg total) by mouth every 8 (eight) hours as needed for muscle spasms.  Patient  not taking: Reported on 05/06/2024   Antonette Angeline ORN, NP  Active Self  triamterene -hydrochlorothiazide  (DYAZIDE ) 37.5-25 MG capsule [id number] Yes TAKE 1 CAPSULE BY MOUTH EACH  DAILY Karamalegos, Marsa PARAS, DO  Active Self  trifluridine -tipiracil  (LONSURF ) 20-8.19 MG tablet [id number] Yes Take 3 tablets (60 mg of trifluridine  total) by mouth 2  (two) times daily. 1hr after AM & PM meals days 1-5, 8-12. Repeat every 28d. Babara Call, MD  Active Self  zinc  gluconate 50 MG tablet [id number] Yes Take 50 mg by mouth daily. [provider]  Active Self  Med List Note Teretha Renaee SAILOR, RPH-CPP 12/05/23 1154): Lonsurf  filled at [facility] (Specialty)            Home Care and Equipment/Supplies: Were Home Health Services Ordered?: NA Any new equipment or medical supplies ordered?: NA  Functional Questionnaire: Do you need assistance with bathing/showering or dressing?: No Do you need assistance with meal preparation?: No Do you need assistance with eating?: No Do you have difficulty maintaining continence: No Do you need assistance with getting out of bed/getting out of a chair/moving?: No Do you have difficulty managing or taking your medications?: No  Follow up appointments reviewed: PCP Follow-up appointment confirmed?: Yes Date of PCP follow-up appointment?: 05/19/24 Follow-up Provider: [facility] Follow-up appointment confirmed?: NA Do you need transportation to your follow-up appointment?: No Do you understand care options if your condition(s) worsen?: Yes-patient verbalized understanding    SIGNATURE Julian Lemmings, LPN [facility] Nurse Health Advisor Direct Dial [phone]

## 2024-05-06 NOTE — Telephone Encounter (Signed)
 Pt has been scheduled for [facility]

## 2024-05-07 ENCOUNTER — Inpatient Hospital Stay (HOSPITAL_BASED_OUTPATIENT_CLINIC_OR_DEPARTMENT_OTHER): Admitting: Hospice and Palliative Medicine

## 2024-05-07 ENCOUNTER — Other Ambulatory Visit: Payer: Self-pay

## 2024-05-07 ENCOUNTER — Encounter (INDEPENDENT_AMBULATORY_CARE_PROVIDER_SITE_OTHER): Payer: Self-pay

## 2024-05-07 ENCOUNTER — Encounter: Payer: Self-pay | Admitting: Hospice and Palliative Medicine

## 2024-05-07 ENCOUNTER — Inpatient Hospital Stay

## 2024-05-07 VITALS — BP 132/80 | HR 103 | Temp 97.3°F | Resp 16 | Wt 149.5 lb

## 2024-05-07 DIAGNOSIS — Z5112 Encounter for antineoplastic immunotherapy: Secondary | ICD-10-CM | POA: Diagnosis not present

## 2024-05-07 DIAGNOSIS — D702 Other drug-induced agranulocytosis: Secondary | ICD-10-CM | POA: Diagnosis not present

## 2024-05-07 DIAGNOSIS — I700 Atherosclerosis of aorta: Secondary | ICD-10-CM | POA: Diagnosis not present

## 2024-05-07 DIAGNOSIS — C799 Secondary malignant neoplasm of unspecified site: Secondary | ICD-10-CM

## 2024-05-07 DIAGNOSIS — D6481 Anemia due to antineoplastic chemotherapy: Secondary | ICD-10-CM | POA: Diagnosis not present

## 2024-05-07 DIAGNOSIS — C7802 Secondary malignant neoplasm of left lung: Secondary | ICD-10-CM | POA: Diagnosis not present

## 2024-05-07 DIAGNOSIS — Z5189 Encounter for other specified aftercare: Secondary | ICD-10-CM | POA: Diagnosis not present

## 2024-05-07 DIAGNOSIS — K56699 Other intestinal obstruction unspecified as to partial versus complete obstruction: Secondary | ICD-10-CM | POA: Diagnosis not present

## 2024-05-07 DIAGNOSIS — K435 Parastomal hernia without obstruction or  gangrene: Secondary | ICD-10-CM | POA: Diagnosis not present

## 2024-05-07 DIAGNOSIS — J90 Pleural effusion, not elsewhere classified: Secondary | ICD-10-CM | POA: Diagnosis not present

## 2024-05-07 DIAGNOSIS — Z87891 Personal history of nicotine dependence: Secondary | ICD-10-CM | POA: Diagnosis not present

## 2024-05-07 DIAGNOSIS — I10 Essential (primary) hypertension: Secondary | ICD-10-CM | POA: Diagnosis not present

## 2024-05-07 DIAGNOSIS — I2510 Atherosclerotic heart disease of native coronary artery without angina pectoris: Secondary | ICD-10-CM | POA: Diagnosis not present

## 2024-05-07 DIAGNOSIS — E7800 Pure hypercholesterolemia, unspecified: Secondary | ICD-10-CM | POA: Diagnosis not present

## 2024-05-07 DIAGNOSIS — G620 Drug-induced polyneuropathy: Secondary | ICD-10-CM | POA: Diagnosis not present

## 2024-05-07 DIAGNOSIS — C7951 Secondary malignant neoplasm of bone: Secondary | ICD-10-CM | POA: Diagnosis not present

## 2024-05-07 DIAGNOSIS — C20 Malignant neoplasm of rectum: Secondary | ICD-10-CM | POA: Diagnosis not present

## 2024-05-07 DIAGNOSIS — C7801 Secondary malignant neoplasm of right lung: Secondary | ICD-10-CM | POA: Diagnosis not present

## 2024-05-07 DIAGNOSIS — C792 Secondary malignant neoplasm of skin: Secondary | ICD-10-CM | POA: Diagnosis not present

## 2024-05-07 DIAGNOSIS — Z7901 Long term (current) use of anticoagulants: Secondary | ICD-10-CM | POA: Diagnosis not present

## 2024-05-07 DIAGNOSIS — T451X5A Adverse effect of antineoplastic and immunosuppressive drugs, initial encounter: Secondary | ICD-10-CM | POA: Diagnosis not present

## 2024-05-07 DIAGNOSIS — R188 Other ascites: Secondary | ICD-10-CM | POA: Diagnosis not present

## 2024-05-07 DIAGNOSIS — Z933 Colostomy status: Secondary | ICD-10-CM | POA: Diagnosis not present

## 2024-05-07 DIAGNOSIS — N281 Cyst of kidney, acquired: Secondary | ICD-10-CM | POA: Diagnosis not present

## 2024-05-07 DIAGNOSIS — Z86718 Personal history of other venous thrombosis and embolism: Secondary | ICD-10-CM | POA: Diagnosis not present

## 2024-05-07 LAB — CBC WITH DIFFERENTIAL (CANCER CENTER ONLY)
Abs Immature Granulocytes: 0.09 K/uL — ABNORMAL HIGH (ref 0.00–0.07)
Basophils Absolute: 0 K/uL (ref 0.0–0.1)
Basophils Relative: 1 %
Eosinophils Absolute: 0.1 K/uL (ref 0.0–0.5)
Eosinophils Relative: 1 %
HCT: 34.8 % — ABNORMAL LOW (ref 36.0–46.0)
Hemoglobin: 11.4 g/dL — ABNORMAL LOW (ref 12.0–15.0)
Immature Granulocytes: 1 %
Lymphocytes Relative: 13 %
Lymphs Abs: 0.8 K/uL (ref 0.7–4.0)
MCH: 33.5 pg (ref 26.0–34.0)
MCHC: 32.8 g/dL (ref 30.0–36.0)
MCV: 102.4 fL — ABNORMAL HIGH (ref 80.0–100.0)
Monocytes Absolute: 0.9 K/uL (ref 0.1–1.0)
Monocytes Relative: 13 %
Neutro Abs: 4.7 K/uL (ref 1.7–7.7)
Neutrophils Relative %: 71 %
Platelet Count: 177 K/uL (ref 150–400)
RBC: 3.4 MIL/uL — ABNORMAL LOW (ref 3.87–5.11)
RDW: 16.5 % — ABNORMAL HIGH (ref 11.5–15.5)
Smear Review: ADEQUATE
WBC Count: 6.7 K/uL (ref 4.0–10.5)
nRBC: 0 % (ref 0.0–0.2)

## 2024-05-07 LAB — CMP (CANCER CENTER ONLY)
ALT: 24 U/L (ref 0–44)
AST: 28 U/L (ref 15–41)
Albumin: 4 g/dL (ref 3.5–5.0)
Alkaline Phosphatase: 80 U/L (ref 38–126)
Anion gap: 7 (ref 5–15)
BUN: 10 mg/dL (ref 8–23)
CO2: 23 mmol/L (ref 22–32)
Calcium: 9.5 mg/dL (ref 8.9–10.3)
Chloride: 104 mmol/L (ref 98–111)
Creatinine: 0.76 mg/dL (ref 0.44–1.00)
GFR, Estimated: >60 mL/min (ref >60)
Glucose, Bld: 102 mg/dL — ABNORMAL HIGH (ref 70–99)
Potassium: 3.6 mmol/L (ref 3.5–5.1)
Sodium: 134 mmol/L — ABNORMAL LOW (ref 135–145)
Total Bilirubin: 0.5 mg/dL (ref 0.0–1.2)
Total Protein: 7.3 g/dL (ref 6.5–8.1)

## 2024-05-07 NOTE — Progress Notes (Signed)
 Symptom Management Clinic [facility] at [facility] [phone] [phone]  Patient Care Team: Edman Marsa PARAS, DO as PCP - General (Family Medicine) Maurie Rayfield BIRCH, RN as Registered Nurse Babara Call, MD as Consulting Physician (Oncology) Rennie Cindy SAUNDERS, MD as Consulting Physician (Oncology)   NAME OF PATIENT: Jacqueline Yoder  [id number]  [birth year]   DATE OF VISIT: 05/07/24  REASON FOR CONSULT: Jacqueline Yoder is a 78 y.o. female with multiple medical problems including metastatic rectal cancer with lung and bony metastasis.   INTERVAL HISTORY: Patient was hospitalized 05/04/2024 to 05/05/2024 with partial distal SBO.  Patient initially presented with abdominal pain with CT abdomen and pelvis showing mildly dilated small bowel loops with a transition zone suspected within the pelvis.  Patient was not having acute vomiting so NG tube was not placed.  Surgery was consulted and SBO appeared to have spontaneously resolved.  Patient was able to pass flatus and stool and tolerating diet prior to discharge.  Patient on treatment regimen of Lonsurf  plus Bev.  Patient presents to [facility] today for posthospital follow-up.  Patient says that she is overall feeling improved but still remains weak.  She says that she is having output from her ostomy and that it is solidifying.  She is also having flatus.  Abdominal pain has improved.  Appetite has improved some but remains poor.  Patient denies any acute complaints or symptoms today.  Denies any neurologic complaints. Denies any easy bleeding or bruising.  Denies chest pain. Denies any nausea, vomiting, constipation, or diarrhea. Denies urinary complaints. Patient offers no further specific complaints today.   PAST MEDICAL HISTORY: Past Medical History:  Diagnosis Date   Allergy    Arthritis    Blood clot in vein    Family history of colon cancer    GERD (gastroesophageal reflux disease)     Hypercholesteremia    Hypertension    Hypertension    Lower extremity edema    Personal history of chemotherapy    Rectal cancer (HCC) 12/2018   Urinary incontinence     PAST SURGICAL HISTORY:  Past Surgical History:  Procedure Laterality Date   ABDOMINAL HYSTERECTOMY     CHOLECYSTECTOMY  1971   COLONOSCOPY WITH PROPOFOL  N/A 12/03/2018   Procedure: COLONOSCOPY WITH PROPOFOL ;  Surgeon: Jinny Carmine, MD;  Location: [facility] ENDOSCOPY;  Service: Endoscopy;  Laterality: N/A;   COLONOSCOPY WITH PROPOFOL  N/A 07/15/2020   Procedure: COLONOSCOPY WITH PROPOFOL ;  Surgeon: Therisa Bi, MD;  Location: [facility] ENDOSCOPY;  Service: Gastroenterology;  Laterality: N/A;   FLEXIBLE SIGMOIDOSCOPY N/A 12/06/2018   Procedure: FLEXIBLE SIGMOIDOSCOPY;  Surgeon: Therisa Bi, MD;  Location: [facility] ENDOSCOPY;  Service: Endoscopy;  Laterality: N/A;   LAPAROSCOPIC COLOSTOMY  01/06/2019   PORTACATH PLACEMENT N/A 04/03/2019   Procedure: INSERTION PORT-A-CATH;  Surgeon: Jordis Laneta JULIANNA, MD;  Location: [facility] ORS;  Service: General;  Laterality: N/A;    HEMATOLOGY/ONCOLOGY HISTORY:  Oncology History  Rectal cancer (HCC)  01/23/2019 Initial Diagnosis   Rectal cancer [facility]) Patient initially presented with complaints of postmenopausal bleeding on 08/16/2018.  History of was menopausal vaginal bleeding in 2016 which resulted in cervical polypectomy.  Pathology 02/04/2015 showed cervical polyp, consistent with benign endometrial polyp.  [name] after polypectomy due to anxiety associated with pelvic exams.  pelvic exam on 08/16/2018 reviewed cervical abnormality and from enlarged uterus. Seen by Dr. Connell on 10/29/2018.  Endometrial biopsy and a Pap smear was performed. 10/29/2018 Pap smear showed adenocarcinoma, favor endometrial  origin. 10/29/2018 endometrial biopsy showed endometrioid carcinoma, FIGO grade 1.  10/29/2018- TA & TV Ultrasound revealed: Anteverted uterus measuring 8.7 x 5.6 x 6.4 cm without evidence of  focal masses.  The endometrium measuring 24.1 mm (thickened) and heterogeneous.  Right and left ovaries not visualized.  No adnexal masses identified.  No free fluid in cul-de-sac.  Patient was seen by Dr. Elby in clinic on 11/13/2018.  Cervical exam reveals 2 cm exophytic irregular mass consistent with malignancy.    11/19/2018 CT chest abdomen pelvis with contrast showed thickened endometrium with some irregularity compatible with the provided diagnosis of endometrial malignancy.  There is a mildly prominent left inguinal node 1.4 cm.  Patient was seen by Dr. Mancil on 11/20/2018 and left groin lymph node biopsy was recommended.  11/26/2018 patient underwent left inguinal lymph node biopsy. Pathology showed metastatic adenocarcinoma consistent with colorectal origin.  CDX 2+.  Case was discussed on tumor board.  Recommend colonoscopy for further evaluation.  Patient reports significant weight loss 30 pounds over the last year.  Chronic vaginal spotting. Change of bowel habits the past few months.  More constipated.  Family history positive for brother who has colon cancer prostate cancer.  patient has underwent colonoscopy on 12/03/2018 which reviewed a nonobstructing large mass in the rectum.  Also chronic fistula.  Mass was not circumferential.  This was biopsied with a cold forceps for histology.  Pathology came back hyperplastic polyp negative for dysplasia and malignancy. [name], patient underwent flex sigmoidoscopy on 12/06/2018 with rebiopsy of the rectal mass. This time biopsy results came back positive for invasive colorectal adenocarcinoma, moderately differentiated. Immunotherapy for nearly mismatch repair protein (MMR ) was performed.  There is no loss of MMR expression.  low probability of MSI high.   # Seen by [name] for evaluation of resectability for rectal cancer. In addition, she also had a second opinion with [facility] pathology where her  endometrial biopsy pathology was changed to  adenocarcinoma, consistent with colorectal primary.   [name]. She has home health that has been assisting with ostomy care  Patient was also evaluated by [facility] oncology.  Recommendation is to proceed with TNT with concurrent chemoradiation followed by neoadjuvant chemotherapy followed by surgical resection. Patient prefers to have treatment done locally with [facility].   # Oncology Treatment:  02/03/2019- 03/19/2019  concurrent Xeloda  and radiation.  Xeloda  dose 825mg  /m2 BID - rounded to 1650mg  BID- on days of radiation. 04/09/2019, started on FOLFOX with bolus early.  Omitted.  07/16/2019 finished 8 cycles of FOLFOX. 09/17/19 APR/posterior vaginectomy/TAH/BSO/VY-flap pT4b pN0 with close vaginal margin 0.2 mm.  Uterus and ovaries negative for malignancy.   10/06/2019 Cancer Staging   Staging form: Colon and Rectum, AJCC 8th Edition - Pathologic stage from 10/06/2019: Stage IIC (ypT4b, pN0, cM0) - Signed by Babara Call, MD on 10/06/2019   06/30/2020 Cancer Staging   Staging form: Colon and Rectum, AJCC 8th Edition - Clinical stage from 06/30/2020: [name] - Signed by Babara Call, MD on 04/17/2022 Stage prefix: Recurrence Total positive nodes: 1   06/30/2020 Relapse/Recurrence   vaginal introitus mass biopsied. Pathology is consistent with metastatic colorectal adenocarcinoma    07/20/2020 - 09/13/2020 Chemotherapy   Concurrent chemotherapy with Xeloda  with Radiation.  08/02/2020-08/16/2020, . Xeloda  was held due to neutropenia    08/17/2020 - 09/06/2020 Radiation Therapy   Xeloda  1500 mg twice daily concurrently with radiation    01/31/2021 Cancer Staging  Staging form: Colon and Rectum, AJCC 8th Edition - Pathologic: Stage Unknown (rpTX, pNX, cM1) - Signed by Babara Call, MD on 01/31/2021 Stage prefix: Recurrence   01/31/2021 - 08/20/2021 Chemotherapy   FOLFIRI + Bev  Irinotecan  dose was reduced, eventually 100mg /m2   Irinotecan  was dropped in November 2022 due to side effects. Negative for UGT1A1*28    09/13/2021 -  Chemotherapy   maintenance 5-FU/bevacizumab     11/28/2021 -  Chemotherapy   5-FU/Irinotecan /bevacizumab . Irinotecan  100 mg/m2 was added back due to progressively increasing CEA.    07/14/2022 Imaging   Bone scan  1. No evidence of skeletal metastatic disease in the hips. 2. Asymmetric increased activity in the RIGHT humeral head is favored degenerative as described above. If pain in the RIGHT shoulder consider further evaluation with plain film or MRI   07/17/2022 Imaging   1. Stable scattered small bilateral pulmonary nodules, largest 8 by 6 mm in the right middle lobe. 2. Subtle chronically stable posterior cortical irregularity and heterogeneity in the left iliac bone, not appreciable on the bone scan of 07/14/2022. This has been present at least over the past year and could reflect radiation therapy related findings although strictly speaking, malignant involvement of the left iliac bone is difficult to exclude. If clinically warranted this could be further assessed with nuclear medicine PET-CT or MRI of the bony pelvis with and without contrast. 3. Pelvic floor laxity with loops of small bowel extending 3 cm below the pubococcygeal line. 4. Aortic and systemic atherosclerosis as detailed above. Aortic Atherosclerosis    12/26/2022 Imaging   CT chest abdomen pelvis w contrast 1. Stable scattered solid pulmonary nodules. 2. Right inguinal and right pelvic lymph nodes which were hypermetabolic on prior PET-CT are stable. 3. Osseous lesion of the proximal right humerus demonstrates increased sclerosis when compared with the prior PET-CT, possibly due to treatment response. 4. 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 5. Aortic Atherosclerosis     06/25/2023 Imaging   CT chest abdomen pelvis w contrast showed 1. Increased size of the solid bilateral pulmonary nodules,consistent  with worsening metastatic disease. 2. Increased size of the right external iliac and inguinal lymph nodes, concerning for worsening metastatic disease. 3. No significant interval change in the left iliac bone osseous metastasis. 4. Enlarged main pulmonary artery measuring 3.6 cm, which can be seen in the setting of pulmonary arterial hypertension. 5.  Aortic Atherosclerosis (ICD10-I70.0)     Miscellaneous   Tempus liquid biopsy showed no actionable variants.  NOTCH1 missense variant, BRCA2 missense variant , MYC missense variant   07/11/2023 Imaging   PET showed 1. Multifocal tracer avid abdominopelvic lymph nodes are identified compatible with metastatic disease. 2. Multifocal bilateral pulmonary nodules are identified compatible with pulmonary metastasis.  3. Diffuse increased radiotracer uptake throughout the marrow space is identified diminishing sensitivity for detecting osseous metastasis. No abnormal increased radiotracer uptake identified above background increased bone marrow activity to indicate metabolically active bone metastases. 4.  Aortic Atherosclerosis   07/23/2023 - 11/13/2023 Chemotherapy   switch to dose reduced FOLFOX    09/16/2023 Imaging   CT abdomen pelvis w contrast  1. Small-bowel obstruction with transition point in the anterior aspect of the lower abdomen, left of midline. This may be secondary to adhesions. 2. Small volume ascites. 3. Prior colon resection with left anterior abdominal wall colostomy. Adjacent parastomal hernia. Small volume fluid within the hernia sac. 4. Stable pulmonary metastatic disease. 5. Stable metastatic lesion of the left iliac wing. 6.  Enlarged right external iliac chain lymph node measuring 1.4 cm, previously 1.3 cm. Several mildly prominent retroperitoneal lymph nodes have increased in size from prior but remain subcentimeter in size. 7. Aortic atherosclerosis (ICD10-I70.0).   09/16/2023 - 09/20/2023 Hospital Admission    Hospitalized due to Small bowel obstruction due to adhesions    09/24/2023 Imaging   1. Bilateral pulmonary metastases are all stable from 06/25/2023 chest CT. 2. No thoracic adenopathy. No new or progressive metastatic disease in the chest. 3. Trace bilateral posterior pleural effusions. 4. Two-vessel coronary atherosclerosis. 5. Dilated main pulmonary artery, suggesting pulmonary arterial hypertension. 6.  Aortic Atherosclerosis (ICD10-I70.0).   11/28/2023 Imaging   CT chest abdomen pelvis with contrast 1. Increased size of retroperitoneal, iliac side chain and inguinal lymph nodes, concerning for progressive nodal metastatic disease. 2. Numerous bilateral solid pulmonary nodules are again seen, some of which have increased in size and others are stable in size overall compatible with disease progression. 3. Increased size of the subcapsular lesion in the anterior inferior right lobe of the liver, which has been stable over multiple prior examinations and was not FDG avid on prior PET-CT. Suggest attention on follow-up imaging. 4. 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 5. Questionable wall thickening of matted loops of small bowel in the left lower quadrant, suggest attention on follow-up imaging. 6. Small volume abdominopelvic free fluid. 7.  Aortic Atherosclerosis (ICD10-I70.0).   12/13/2023 -  Chemotherapy   Patient is on Treatment Plan : COLORECTAL Bevacizumab  + Trifluridine /Tipiracil  q28d     01/01/2024 Procedure   Right inguinal crease skin nodule biopsy  Pathology showed dermal deposits of metastatic colon adenocarcinoma, extending to the edge and base.      01/14/2024 Imaging   PET scan showed  1. Progressive disease with progressive left supraclavicular adenopathy, progressive pulmonary metastatic disease, progressive abdominal and pelvic adenopathy, and progressive multifocal skeletal involvement. 2. Substantial  multifocal skeletal involvement including the right proximal humerus, right medial clavicle, left T11 vertebral body, L4 posterior elements, and entire left iliac crest. The permeative lesion of the right humerus may have some extraosseous extension. Similarly there is cortical breakthrough in the large left iliac crest lesion. 3. Hypermetabolic irregular tumor extends along the right inguinal ring region and right hip adductor musculature as well as in the subcutaneous and cutaneous tissues along the right inguinal fold, compatible with tumor involvement. 4. Right eccentric vulvar activity just below the clitoris has a maximum SUV of 7.5, and given the regional soft tissue metastatic disease, a metastatic focus in this vicinity is not excluded. 5. The subcapsular lesion along the anterior inferior right hepatic lobe described on CT of 11/28/2023 is not discernibly hypermetabolic or visible on today's PET-CT. 6. Descending left colostomy with mild diverticulosis. 7.  Aortic Atherosclerosis (ICD10-I70.0).    04/10/2024 Imaging   CT chest abdomen pelvis w contrast showed 1. Status post abdominoperineal resection with left lower quadrant end colostomy. 2. New, eccentric, nodular wall thickening of the colon at the hepatic flexure measuring 2.3 x 1.4 cm. This may be transient and peristaltic however a metastatic peritoneal implant or metachronous colon malignancy not excluded. Attention on follow-up. 3. Otherwise no significant change in previously FDG avid widespread pulmonary, nodal, and osseous metastatic disease, as well as soft tissue involvement of the right lower quadrant abdominal wall and pannus. 4. Coronary artery disease.     Metastatic adenocarcinoma (HCC)  12/13/2023 -  Chemotherapy   Patient is on Treatment Plan :  COLORECTAL Bevacizumab  + Trifluridine /Tipiracil  q28d       ALLERGIES:  is allergic to sulfamethoxazole-trimethoprim.  MEDICATIONS:  Current Outpatient  Medications  Medication Sig Dispense Refill   acetaminophen  (TYLENOL ) 650 MG CR tablet Take 650 mg by mouth every 8 (eight) hours as needed for pain.     apixaban  (ELIQUIS ) 2.5 MG TABS tablet Take 1 tablet (2.5 mg total) by mouth 2 (two) times daily. 200 tablet 2   Cholecalciferol  (VITAMIN D3) 2000 units capsule Take 2,000 Units by mouth daily.     diphenoxylate -atropine  (LOMOTIL ) 2.5-0.025 MG tablet Take 1 tablet by mouth 4 (four) times daily as needed for diarrhea or loose stools. 120 tablet 0   fluticasone  (FLONASE ) 50 MCG/ACT nasal spray Place 1 spray into both nostrils daily as needed for allergies or rhinitis.     ipratropium (ATROVENT ) 0.03 % nasal spray Place 2 sprays into both nostrils 2 (two) times daily.     lidocaine -prilocaine  (EMLA ) cream Apply 1 Application topically daily as needed. Apply small amount to port and cover with saran wrap 1-2 hours prior to port access 30 g 3   loratadine  (CLARITIN ) 10 MG tablet Take 10 mg by mouth daily.     magnesium  chloride (SLOW-MAG) 64 MG TBEC SR tablet Take 1 tablet (64 mg total) by mouth 2 (two) times daily. 60 tablet 2   megestrol  (MEGACE ) 40 MG/ML suspension Take 40 mg by mouth 2 (two) times daily.     Multiple Vitamins-Minerals (ONE-A-DAY WOMENS 50 PLUS PO) Take 1 tablet by mouth daily.      nystatin  (MYCOSTATIN /NYSTOP ) powder Apply 1 Application topically daily.     ondansetron  (ZOFRAN ) 8 MG tablet Take 1 tablet (8 mg total) by mouth every 8 (eight) hours as needed for nausea or vomiting. 60 tablet 1   potassium chloride  SA (KLOR-CON  M) 20 MEQ tablet TAKE 1 TABLET BY MOUTH DAILY (Patient taking differently: Take 20 mEq by mouth 2 (two) times daily.) 100 tablet 1   simvastatin  (ZOCOR ) 40 MG tablet Take 1 tablet (40 mg total) by mouth at bedtime. 100 tablet 3   triamterene -hydrochlorothiazide  (DYAZIDE ) 37.5-25 MG capsule TAKE 1 CAPSULE BY MOUTH EACH  DAILY 90 capsule 1   trifluridine -tipiracil  (LONSURF ) 20-8.19 MG tablet Take 3 tablets (60 mg  of trifluridine  total) by mouth 2 (two) times daily. 1hr after AM & PM meals days 1-5, 8-12. Repeat every 28d. 60 tablet 1   zinc  gluconate 50 MG tablet Take 50 mg by mouth daily.     tiZANidine  (ZANAFLEX ) 4 MG tablet Take 0.5 tablets (2 mg total) by mouth every 8 (eight) hours as needed for muscle spasms. (Patient not taking: Reported on 05/07/2024) 10 tablet 0   No current facility-administered medications for this visit.   Facility-Administered Medications Ordered in Other Visits  Medication Dose Route Frequency Provider Last Rate Last Admin   heparin  lock flush 100 UNIT/ML injection             VITAL SIGNS: BP 132/80 (BP Location: Left Arm, Patient Position: Sitting, Cuff Size: Normal)   Pulse (!) 103   Temp (!) 97.3 F (36.3 C) (Tympanic)   Resp 16   Wt 149 lb 8 oz (67.8 kg)   SpO2 100%   BMI 25.66 kg/m  Filed Weights   05/07/24 0851  Weight: 149 lb 8 oz (67.8 kg)    Estimated body mass index is 25.66 kg/m as calculated from the following:   Height as of 05/04/24: 5' 4 (1.626 m).  Weight as of this encounter: 149 lb 8 oz (67.8 kg).  LABS: CBC:    Component Value Date/Time   WBC 3.4 (L) 05/05/2024 0923   HGB 9.6 (L) 05/05/2024 0923   HGB 10.6 (L) 05/01/2024 0849   HCT 28.8 (L) 05/05/2024 0923   PLT 150 05/05/2024 0923   PLT 223 05/01/2024 0849   MCV 102.5 (H) 05/05/2024 0923   NEUTROABS 2.2 05/05/2024 0923   LYMPHSABS 0.7 05/05/2024 0923   MONOABS 0.5 05/05/2024 0923   EOSABS 0.1 05/05/2024 0923   BASOSABS 0.0 05/05/2024 0923   Comprehensive Metabolic Panel:    Component Value Date/Time   NA 138 05/05/2024 0923   K 3.3 (L) 05/05/2024 0923   CL 105 05/05/2024 0923   CO2 23 05/05/2024 0923   BUN 15 05/05/2024 0923   CREATININE 0.83 05/05/2024 0923   CREATININE 0.75 05/01/2024 0849   CREATININE 1.10 (H) 06/24/2021 0802   GLUCOSE 96 05/05/2024 0923   CALCIUM  8.9 05/05/2024 0923   AST 30 05/04/2024 0634   AST 34 05/01/2024 0849   ALT 20 05/04/2024 0634    ALT 22 05/01/2024 0849   ALKPHOS 62 05/04/2024 0634   BILITOT 0.8 05/04/2024 0634   BILITOT 0.7 05/01/2024 0849   PROT 6.6 05/04/2024 0634   ALBUMIN 3.3 (L) 05/04/2024 0634    RADIOGRAPHIC STUDIES: DG Abd 2 Views Result Date: 05/04/2024 CLINICAL DATA:  Small-bowel obstruction follow-up. History of metastatic rectal cancer. EXAM: ABDOMEN - 2 VIEW COMPARISON:  CT AP from 05/04/2024 FINDINGS: Persistent dilated loops of bowel noted within the lower abdomen and pelvis measuring up to 3.9 cm corresponding to the bowel obstruction from earlier today. No signs of pneumoperitoneum. Visualized osseous structures appear grossly intact. IV contrast material from recent CT is noted within renal the collecting systems and bladder. IMPRESSION: Persistent dilated loops of bowel within the lower abdomen and pelvis corresponding to the bowel obstruction from earlier today. Electronically Signed   By: Waddell Calk M.D.   On: 05/04/2024 09:18   CT ABDOMEN PELVIS W CONTRAST Result Date: 05/04/2024 CLINICAL DATA:  Epigastric pain and nausea. EXAM: CT ABDOMEN AND PELVIS WITH CONTRAST TECHNIQUE: Multidetector CT imaging of the abdomen and pelvis was performed using the standard protocol following bolus administration of intravenous contrast. RADIATION DOSE REDUCTION: This exam was performed according to the departmental dose-optimization program which includes automated exposure control, adjustment of the mA and/or kV according to patient size and/or use of iterative reconstruction technique. CONTRAST:  OMNIPAQUE  IOHEXOL  300 MG/ML  SOLN COMPARISON:  April 10, 2024 FINDINGS: Lower chest: Multiple subcentimeter pulmonary nodules are seen within the left upper lobe right middle lobe and bilateral lower lobes (the largest measures approximately 4 mm). A stable 15 mm x 14 mm pulmonary nodule is also seen within the anteromedial aspect of the right middle lobe. Hepatobiliary: There is diffuse fatty infiltration of the liver  parenchyma. No focal liver abnormality is seen. Status post cholecystectomy. No biliary dilatation. Pancreas: Unremarkable. No pancreatic ductal dilatation or surrounding inflammatory changes. Spleen: Normal in size without focal abnormality. Adrenals/Urinary Tract: Adrenal glands are unremarkable. Kidneys are normal in size, without renal calculi or hydronephrosis. Multiple stable bilateral simple renal cysts are seen. Bladder is unremarkable. Stomach/Bowel: Stomach is within normal limits. Appendix appears normal. Mildly dilated small bowel loops are seen within the mid and lower abdomen (maximum small bowel diameter of approximately 3.2 cm). A transition zone is suspected within the pelvis. There is evidence of prior abdominoperineal resection with a  left lower quadrant ostomy site noted. Noninflamed diverticula are seen within the remaining visualized large bowel. There is a 5.0 cm x 6.3 cm parastomal hernia which contains fat and a segment of large bowel. Vascular/Lymphatic: Aortic atherosclerosis. No enlarged abdominal or pelvic lymph nodes. Reproductive: Status post hysterectomy. No adnexal masses. Other: No abdominopelvic ascites. Musculoskeletal: Multilevel degenerative changes are seen throughout the lumbar spine. IMPRESSION: 1. Findings consistent with a distal partial small bowel obstruction. 2. Evidence of prior abdominoperineal resection with a left lower quadrant ostomy site. 3. 5.0 cm x 6.3 cm parastomal hernia which contains fat and a segment of large bowel. 4. Colonic diverticulosis. 5. Multiple subcentimeter pulmonary nodules within the left upper lobe right middle lobe and bilateral lower lobes, as described above. 6. Stable 15 mm x 14 mm right middle lobe pulmonary nodule. 7. Hepatic steatosis. 8. Evidence of prior cholecystectomy and hysterectomy. 9. Aortic atherosclerosis. Electronically Signed   By: Suzen Dials M.D.   On: 05/04/2024 02:18   CT CHEST ABDOMEN PELVIS W CONTRAST Result  Date: 04/16/2024 CLINICAL DATA:  Metastatic rectal cancer restaging * Tracking Code: BO * EXAM: CT CHEST, ABDOMEN, AND PELVIS WITH CONTRAST TECHNIQUE: Multidetector CT imaging of the chest, abdomen and pelvis was performed following the standard protocol during bolus administration of intravenous contrast. RADIATION DOSE REDUCTION: This exam was performed according to the departmental dose-optimization program which includes automated exposure control, adjustment of the mA and/or kV according to patient size and/or use of iterative reconstruction technique. CONTRAST:  85mL OMNIPAQUE  IOHEXOL  300 MG/ML  SOLN COMPARISON:  PET-CT, 01/14/2024 FINDINGS: CT CHEST FINDINGS Cardiovascular: Aortic atherosclerosis. Normal heart size. Left coronary artery calcifications. No pericardial effusion. Mediastinum/Nodes: No significant change in enlarged lower left cervical lymph node measuring 2.1 x 1.3 cm, previously FDG avid (series 2, image 6). Thyroid  gland, trachea, and esophagus demonstrate no significant findings. Lungs/Pleura: Numerous bilateral pulmonary nodules not significantly changed, largest index nodule of the medial segment right middle lobe measuring 1.5 x 1.4 cm (series 3, image 99), additional index nodule in the peripheral left upper lobe measuring 0.7 cm (series 3, image 40). No pleural effusion or pneumothorax. Musculoskeletal: No chest wall abnormality. No acute osseous findings. CT ABDOMEN PELVIS FINDINGS Hepatobiliary: No focal liver abnormality is seen. Status post cholecystectomy. No biliary dilatation. Pancreas: Unremarkable. No pancreatic ductal dilatation or surrounding inflammatory changes. Spleen: Normal in size without significant abnormality. Adrenals/Urinary Tract: Adrenal glands are unremarkable. Multiple benign bilateral renal cortical cysts, for which no further follow-up or characterization is required. Kidneys are otherwise normal, without renal calculi, solid lesion, or hydronephrosis. Bladder  is unremarkable. Stomach/Bowel: Stomach is within normal limits. Normal appendix. Status post abdominoperineal resection with left lower quadrant end colostomy. New, eccentric, nodular wall thickening of the colon at the hepatic flexure measuring 2.3 x 1.4 cm (series 2, image 69). Vascular/Lymphatic: Aortic atherosclerosis. Similar appearance of numerous matted, previously FDG avid retroperitoneal and pelvic lymph nodes, index left retroperitoneal node or conglomerate measuring 2.0 x 1.4 cm (series 2, image 66). Unchanged index right external iliac node measures 1.8 x 1.4 cm with matted soft tissue about the right common femoral artery likewise unchanged (series 2, image 78, 104). Reproductive: Status post hysterectomy. Other: Left lower quadrant end colostomy with a small parastomal hernia. Large abdominal pannus with skin thickening and nodularity of both the external surface and underlying fold, previously FDG avid (series 2, image 106). No ascites. Musculoskeletal: No acute osseous findings. Numerous sclerotic osseous metastases involving the axial and proximal appendicular skeleton,  for example of the proximal right humerus, right clavicular head and left ilium CT appearance not significantly changed. IMPRESSION: 1. Status post abdominoperineal resection with left lower quadrant end colostomy. 2. New, eccentric, nodular wall thickening of the colon at the hepatic flexure measuring 2.3 x 1.4 cm. This may be transient and peristaltic however a metastatic peritoneal implant or metachronous colon malignancy not excluded. Attention on follow-up. 3. Otherwise no significant change in previously FDG avid widespread pulmonary, nodal, and osseous metastatic disease, as well as soft tissue involvement of the right lower quadrant abdominal wall and pannus. 4. Coronary artery disease. Aortic Atherosclerosis (ICD10-I70.0). Electronically Signed   By: Marolyn JONETTA Jaksch M.D.   On: 04/16/2024 06:45    PERFORMANCE STATUS (ECOG)  : 2 - Symptomatic, <50% confined to bed  Review of Systems Unless otherwise noted, a complete review of systems is negative.  Physical Exam General: NAD Cardiovascular: regular rate and rhythm Pulmonary: clear ant fields Abdomen: soft, nontender, + bowel sounds, ostomy noted but not visualized GU: no suprapubic tenderness Extremities: no edema, no joint deformities Skin: no rashes Neurological: Weakness but otherwise nonfocal  IMPRESSION/PLAN: Stage IV rectal cancer -on Lonsurf  plus Bev.  MD follow-up on 05/15/2024.  Recent hospitalization for partial SBO but this appears to have resolved.  Patient is not having any acute symptoms of active obstruction.  She remains weak with a poor appetite.  Labs unremarkable.  Patient does have slight hyponatremia, likely from poor oral intake.  She declined IV fluids.  Recommended increasing oral fluids.  Case and plan discussed with Dr. Babara.  MD follow-up next week   Patient expressed understanding and was in agreement with this plan. She also understands that She can call clinic at any time with any questions, concerns, or complaints.   Thank you for allowing me to participate in the care of this very pleasant patient.   Time Total: 20 minutes  Visit consisted of counseling and education dealing with the complex and emotionally intense issues of symptom management in the setting of serious illness.Greater than 50%  of this time was spent counseling and coordinating care related to the above assessment and plan.  Signed by: Fonda Mower, PhD, NP-C

## 2024-05-08 ENCOUNTER — Other Ambulatory Visit

## 2024-05-08 ENCOUNTER — Other Ambulatory Visit: Payer: Self-pay

## 2024-05-08 ENCOUNTER — Ambulatory Visit: Admitting: Physician Assistant

## 2024-05-08 DIAGNOSIS — Z8507 Personal history of malignant neoplasm of pancreas: Secondary | ICD-10-CM | POA: Diagnosis not present

## 2024-05-08 DIAGNOSIS — Z933 Colostomy status: Secondary | ICD-10-CM | POA: Diagnosis not present

## 2024-05-08 DIAGNOSIS — Z433 Encounter for attention to colostomy: Secondary | ICD-10-CM | POA: Diagnosis not present

## 2024-05-08 DIAGNOSIS — Z08 Encounter for follow-up examination after completed treatment for malignant neoplasm: Secondary | ICD-10-CM | POA: Diagnosis not present

## 2024-05-08 DIAGNOSIS — H538 Other visual disturbances: Secondary | ICD-10-CM | POA: Diagnosis not present

## 2024-05-08 NOTE — Progress Notes (Signed)
 Specialty Pharmacy Refill Coordination Note  KRITHI BRAY is a 78 y.o. female contacted today regarding refills of specialty medication(s) Trifluridine -Tipiracil  (Lonsurf )   Patient requested (Patient-Rptd) Delivery   Delivery date: 05/09/24   Verified address: (Patient-Rptd) [address] [place], [place] [address]   Medication will be filled on 07.24.25.

## 2024-05-09 ENCOUNTER — Encounter: Payer: Self-pay | Admitting: Podiatry

## 2024-05-09 ENCOUNTER — Ambulatory Visit (INDEPENDENT_AMBULATORY_CARE_PROVIDER_SITE_OTHER): Admitting: Podiatry

## 2024-05-09 DIAGNOSIS — M79674 Pain in right toe(s): Secondary | ICD-10-CM | POA: Diagnosis not present

## 2024-05-09 DIAGNOSIS — B351 Tinea unguium: Secondary | ICD-10-CM | POA: Diagnosis not present

## 2024-05-09 DIAGNOSIS — D689 Coagulation defect, unspecified: Secondary | ICD-10-CM | POA: Diagnosis not present

## 2024-05-09 DIAGNOSIS — G620 Drug-induced polyneuropathy: Secondary | ICD-10-CM | POA: Diagnosis not present

## 2024-05-09 DIAGNOSIS — M79675 Pain in left toe(s): Secondary | ICD-10-CM | POA: Diagnosis not present

## 2024-05-12 NOTE — Progress Notes (Signed)
  Subjective:  Patient ID: Jacqueline Yoder, female    DOB: [birth year],  MRN: [id number]  ILLONA Yoder presents to clinic today for at risk foot care. Patient has history of neuropathy and coagulation defect and painful thick toenails that are difficult to trim. Pain interferes with ambulation. Aggravating factors include wearing enclosed shoe gear. Pain is relieved with periodic professional debridement. She is currently undergoing treatment for metastatic rectal cancer. Chief Complaint  Patient presents with   Nail Problem    Thick painful toenails, 3 month follow up    New problem(s): None.   PCP is Edman Marsa PARAS, DO. ARNETTA 01/08/2024.  Allergies  Allergen Reactions   Sulfamethoxazole-Trimethoprim Other (See Comments)    Also said she had chills and pressure in nose.    Review of Systems: Negative except as noted in the HPI.  Objective: No changes noted in today's physical examination. There were no vitals filed for this visit. Jacqueline Yoder is a pleasant 78 y.o. female in NAD. AAO x 3.  Vascular Examination: Capillary refill time immediate b/l. Vascular status intact b/l with palpable pedal pulses. Pedal hair absent b/l. No pain with calf compression b/l. Skin temperature gradient WNL b/l. No cyanosis or clubbing b/l. No ischemia or gangrene noted b/l. No edema noted b/l LE.  Neurological Examination: Pt has subjective symptoms of neuropathy. Protective sensation diminished with 10g monofilament b/l.  Dermatological Examination: Pedal skin with normal turgor, texture and tone b/l.  No open wounds. No interdigital macerations.   Toenails 1-5 b/l thick, discolored, elongated with subungual debris and pain on dorsal palpation.   No hyperkeratotic nor porokeratotic lesions.  No corns, calluses, nor porokeratotic lesions.  Pedal skin is warm and supple b/l LE. No open wounds b/l LE. No interdigital macerations noted b/l LE. Toenails 1-5 bilaterally elongated,  discolored, dystrophic, thickened, and crumbly with subungual debris and tenderness to dorsal palpation.  Musculoskeletal Examination: Muscle strength 5/5 to all lower extremity muscle groups bilaterally. No pain, crepitus or joint limitation noted with ROM bilateral LE. HAV with bunion deformity noted b/l LE.  Radiographs: None  Last A1c:      Latest Ref Rng & Units 07/04/2023    8:45 AM  Hemoglobin A1C  Hemoglobin-A1c <5.7 % of total Hgb 5.1    Assessment/Plan: 1. Pain due to onychomycosis of toenails of both feet   2. Chemotherapy-induced neuropathy (HCC)   3. Coagulation defect [facility])     Consent given for treatment. Patient examined. All patient's and/or POA's questions/concerns addressed on today's visit. Mycotic toenails 1-5 debrided in length and girth without incident. Continue soft, supportive shoe gear daily. Report any pedal injuries to medical professional. Call office if there are any quesitons/concerns. -Patient/POA to call should there be question/concern in the interim.   Return in about 3 months (around 08/09/2024).  Delon LITTIE Merlin, DPM       LOCATION: [address] [place] [address]                   Office [phone]   [facility] LOCATION: [address] [place], [place] [address] Office [phone]

## 2024-05-13 ENCOUNTER — Ambulatory Visit: Admitting: Physician Assistant

## 2024-05-13 ENCOUNTER — Encounter: Admitting: Physician Assistant

## 2024-05-13 DIAGNOSIS — Z85048 Personal history of other malignant neoplasm of rectum, rectosigmoid junction, and anus: Secondary | ICD-10-CM | POA: Diagnosis not present

## 2024-05-13 DIAGNOSIS — L98492 Non-pressure chronic ulcer of skin of other sites with fat layer exposed: Secondary | ICD-10-CM | POA: Diagnosis not present

## 2024-05-13 DIAGNOSIS — Z7901 Long term (current) use of anticoagulants: Secondary | ICD-10-CM | POA: Diagnosis not present

## 2024-05-13 DIAGNOSIS — Z86718 Personal history of other venous thrombosis and embolism: Secondary | ICD-10-CM | POA: Diagnosis not present

## 2024-05-13 DIAGNOSIS — C3412 Malignant neoplasm of upper lobe, left bronchus or lung: Secondary | ICD-10-CM | POA: Diagnosis not present

## 2024-05-13 DIAGNOSIS — C4499 Other specified malignant neoplasm of skin, unspecified: Secondary | ICD-10-CM | POA: Diagnosis not present

## 2024-05-15 ENCOUNTER — Inpatient Hospital Stay (HOSPITAL_BASED_OUTPATIENT_CLINIC_OR_DEPARTMENT_OTHER): Admitting: Oncology

## 2024-05-15 ENCOUNTER — Inpatient Hospital Stay

## 2024-05-15 ENCOUNTER — Encounter: Payer: Self-pay | Admitting: Oncology

## 2024-05-15 ENCOUNTER — Other Ambulatory Visit: Payer: Self-pay

## 2024-05-15 VITALS — BP 119/75 | HR 110 | Temp 97.8°F | Resp 18 | Wt 150.4 lb

## 2024-05-15 DIAGNOSIS — N281 Cyst of kidney, acquired: Secondary | ICD-10-CM | POA: Diagnosis not present

## 2024-05-15 DIAGNOSIS — C792 Secondary malignant neoplasm of skin: Secondary | ICD-10-CM

## 2024-05-15 DIAGNOSIS — K435 Parastomal hernia without obstruction or  gangrene: Secondary | ICD-10-CM | POA: Diagnosis not present

## 2024-05-15 DIAGNOSIS — C20 Malignant neoplasm of rectum: Secondary | ICD-10-CM

## 2024-05-15 DIAGNOSIS — I2510 Atherosclerotic heart disease of native coronary artery without angina pectoris: Secondary | ICD-10-CM | POA: Diagnosis not present

## 2024-05-15 DIAGNOSIS — K521 Toxic gastroenteritis and colitis: Secondary | ICD-10-CM

## 2024-05-15 DIAGNOSIS — I700 Atherosclerosis of aorta: Secondary | ICD-10-CM | POA: Diagnosis not present

## 2024-05-15 DIAGNOSIS — G620 Drug-induced polyneuropathy: Secondary | ICD-10-CM | POA: Diagnosis not present

## 2024-05-15 DIAGNOSIS — D702 Other drug-induced agranulocytosis: Secondary | ICD-10-CM | POA: Diagnosis not present

## 2024-05-15 DIAGNOSIS — T451X5A Adverse effect of antineoplastic and immunosuppressive drugs, initial encounter: Secondary | ICD-10-CM | POA: Diagnosis not present

## 2024-05-15 DIAGNOSIS — Z5112 Encounter for antineoplastic immunotherapy: Secondary | ICD-10-CM | POA: Diagnosis not present

## 2024-05-15 DIAGNOSIS — C7801 Secondary malignant neoplasm of right lung: Secondary | ICD-10-CM | POA: Diagnosis not present

## 2024-05-15 DIAGNOSIS — J90 Pleural effusion, not elsewhere classified: Secondary | ICD-10-CM | POA: Diagnosis not present

## 2024-05-15 DIAGNOSIS — R188 Other ascites: Secondary | ICD-10-CM | POA: Diagnosis not present

## 2024-05-15 DIAGNOSIS — D6481 Anemia due to antineoplastic chemotherapy: Secondary | ICD-10-CM | POA: Diagnosis not present

## 2024-05-15 DIAGNOSIS — C799 Secondary malignant neoplasm of unspecified site: Secondary | ICD-10-CM

## 2024-05-15 DIAGNOSIS — Z5189 Encounter for other specified aftercare: Secondary | ICD-10-CM | POA: Diagnosis not present

## 2024-05-15 DIAGNOSIS — C7951 Secondary malignant neoplasm of bone: Secondary | ICD-10-CM | POA: Diagnosis not present

## 2024-05-15 DIAGNOSIS — Z86718 Personal history of other venous thrombosis and embolism: Secondary | ICD-10-CM | POA: Diagnosis not present

## 2024-05-15 DIAGNOSIS — Z87891 Personal history of nicotine dependence: Secondary | ICD-10-CM | POA: Diagnosis not present

## 2024-05-15 DIAGNOSIS — Z933 Colostomy status: Secondary | ICD-10-CM | POA: Diagnosis not present

## 2024-05-15 DIAGNOSIS — I10 Essential (primary) hypertension: Secondary | ICD-10-CM | POA: Diagnosis not present

## 2024-05-15 DIAGNOSIS — C7802 Secondary malignant neoplasm of left lung: Secondary | ICD-10-CM | POA: Diagnosis not present

## 2024-05-15 DIAGNOSIS — K56699 Other intestinal obstruction unspecified as to partial versus complete obstruction: Secondary | ICD-10-CM | POA: Diagnosis not present

## 2024-05-15 DIAGNOSIS — E7800 Pure hypercholesterolemia, unspecified: Secondary | ICD-10-CM | POA: Diagnosis not present

## 2024-05-15 DIAGNOSIS — Z7901 Long term (current) use of anticoagulants: Secondary | ICD-10-CM | POA: Diagnosis not present

## 2024-05-15 DIAGNOSIS — Z86711 Personal history of pulmonary embolism: Secondary | ICD-10-CM | POA: Diagnosis not present

## 2024-05-15 LAB — CBC WITH DIFFERENTIAL (CANCER CENTER ONLY)
Abs Immature Granulocytes: 0.03 K/uL (ref 0.00–0.07)
Basophils Absolute: 0 K/uL (ref 0.0–0.1)
Basophils Relative: 0 %
Eosinophils Absolute: 0.1 K/uL (ref 0.0–0.5)
Eosinophils Relative: 2 %
HCT: 33.8 % — ABNORMAL LOW (ref 36.0–46.0)
Hemoglobin: 11.2 g/dL — ABNORMAL LOW (ref 12.0–15.0)
Immature Granulocytes: 0 %
Lymphocytes Relative: 14 %
Lymphs Abs: 1 K/uL (ref 0.7–4.0)
MCH: 33.4 pg (ref 26.0–34.0)
MCHC: 33.1 g/dL (ref 30.0–36.0)
MCV: 100.9 fL — ABNORMAL HIGH (ref 80.0–100.0)
Monocytes Absolute: 0.6 K/uL (ref 0.1–1.0)
Monocytes Relative: 9 %
Neutro Abs: 5 K/uL (ref 1.7–7.7)
Neutrophils Relative %: 75 %
Platelet Count: 225 K/uL (ref 150–400)
RBC: 3.35 MIL/uL — ABNORMAL LOW (ref 3.87–5.11)
RDW: 16.6 % — ABNORMAL HIGH (ref 11.5–15.5)
WBC Count: 6.7 K/uL (ref 4.0–10.5)
nRBC: 0 % (ref 0.0–0.2)

## 2024-05-15 LAB — CMP (CANCER CENTER ONLY)
ALT: 24 U/L (ref 0–44)
AST: 38 U/L (ref 15–41)
Albumin: 3.6 g/dL (ref 3.5–5.0)
Alkaline Phosphatase: 72 U/L (ref 38–126)
Anion gap: 8 (ref 5–15)
BUN: 17 mg/dL (ref 8–23)
CO2: 23 mmol/L (ref 22–32)
Calcium: 9.1 mg/dL (ref 8.9–10.3)
Chloride: 104 mmol/L (ref 98–111)
Creatinine: 0.8 mg/dL (ref 0.44–1.00)
GFR, Estimated: >60 mL/min (ref >60)
Glucose, Bld: 146 mg/dL — ABNORMAL HIGH (ref 70–99)
Potassium: 3.6 mmol/L (ref 3.5–5.1)
Sodium: 135 mmol/L (ref 135–145)
Total Bilirubin: 0.6 mg/dL (ref 0.0–1.2)
Total Protein: 7.2 g/dL (ref 6.5–8.1)

## 2024-05-15 LAB — TOTAL PROTEIN, URINE DIPSTICK: Protein, ur: NEGATIVE mg/dL

## 2024-05-15 MED ORDER — HEPARIN SOD (PORK) LOCK FLUSH 100 UNIT/ML IV SOLN
500.0000 [IU] | Freq: Once | INTRAVENOUS | Status: AC
  Administered 2024-05-15: 500 [IU] via INTRAVENOUS
  Filled 2024-05-15: qty 5

## 2024-05-15 NOTE — Assessment & Plan Note (Signed)
 continue lomotil  QID PRN as instructed.

## 2024-05-15 NOTE — Assessment & Plan Note (Addendum)
 History of stage IIIC Rectal cancer, s/p TNT, followed by 09/17/19 APR/posterior vaginectomy/TAH/BSO/VY-flap, pT4b pN0 with close vaginal margin 0.2 mm.  Uterus and ovaries negative for malignancy. palliative radiation to vaginal recurrence- 01/19/21 recurrence with lung metastasis.-Palliative -FOLFIRI plus bevacizumab .  Irinotecan  was dropped in November 2022 due to side effects. Negative for UGT1A1*28 - radiographically stable, rise of CEA-July 2023 CT lung metastasis worse--> Dec 2023 PET showed progression in pelvic lymph nodes and bone lesions,--> 2nd line irinotecan  +panitumumab -->CT March 2024 stable. --> June 2024 CT showed mild lung progression/Progressive left iliac bone metastasis, vulvar mass difficult for biopsy in office,  Tempus Liquid biopsy - no actionable variants --> 06/25/2023 CT showed progression of lung nodules [SLD increase 40mm] and iliac inguinal lymphadenopathy--> 07/23/23 switch to dose reduced FOLFOX --> Dec 2024 CT- stable disease. --> Feb 2025 disease progression--> longsurf + Bevacizumab   Labs are reviewed and discussed with patient   CEA nadir was in 490, progressively increases - 10,193  Currently on longsurf [60mg  BID day 1-5 and day 8- 12 every 28-day  + Bevacizumab  D1, D15 Bevacizumab  Hold chemotherapy due to high risk of obstruction/perforation. I discussed her care on tumor board.  04/10/2024 CT scan showed new, eccentric, nodular wall thickening of hepatic flexure mass- 2.3 x 1.4 cm. Radiology feels this is less likely peristaltic changes, but a new colon mass which attributed to her most recent bowel obstruction event.  Discussed case with Dr. Jordis today. He will see patient for evaluation of feasibility of surgical intervention. If she is not a candidate, need Radonc evaluation to see if this site could be potentially radiated.  Patient and daughter may also seek second opinion at [facility].  Prognosis is poor.

## 2024-05-15 NOTE — Assessment & Plan Note (Addendum)
 S/p skin nodule biopsy confirmed metastatic adenocarcinoma consistent with colorectal origin.  No role for palliative RT per Radonc Follow up with wound care

## 2024-05-15 NOTE — Assessment & Plan Note (Signed)
 History of PE and bilateral lower extremity DVT  Continue  Eliquis  2.5 mg twice daily for anticoagulation prophylaxis. Recommend leg elevation and compression stocking.

## 2024-05-15 NOTE — Assessment & Plan Note (Signed)
 Hb stable close monitor.

## 2024-05-15 NOTE — Progress Notes (Signed)
 Hematology/Oncology Progress note [phone] [phone]      CHIEF COMPLAINTS/REASON FOR VISIT:  Follow up for rectal cancer  ASSESSMENT & PLAN:   Cancer Staging  Rectal cancer [name]) Staging form: Colon and Rectum, AJCC 8th Edition - Pathologic stage from 10/06/2019: Stage IIC (ypT4b, ypN0, cM0) - Signed by Babara Call, MD on 10/06/2019 - Clinical stage from 06/30/2020: [name] - Signed by Babara Call, MD on 04/17/2022 - Pathologic: Stage Unknown (rpTX, rpNX, rcM1) - Signed by Babara Call, MD on 01/31/2021   Rectal cancer [name]) History of stage IIIC Rectal cancer, s/p TNT, followed by 09/17/19 APR/posterior vaginectomy/TAH/BSO/VY-flap, pT4b pN0 with close vaginal margin 0.2 mm.  Uterus and ovaries negative for malignancy. palliative radiation to vaginal recurrence- 01/19/21 recurrence with lung metastasis.-Palliative -FOLFIRI plus bevacizumab .  Irinotecan  was dropped in November 2022 due to side effects. Negative for UGT1A1*28 - radiographically stable, rise of CEA-July 2023 CT lung metastasis worse--> Dec 2023 PET showed progression in pelvic lymph nodes and bone lesions,--> 2nd line irinotecan  +panitumumab -->CT March 2024 stable. --> June 2024 CT showed mild lung progression/Progressive left iliac bone metastasis, vulvar mass difficult for biopsy in office,  Tempus Liquid biopsy - no actionable variants --> 06/25/2023 CT showed progression of lung nodules [SLD increase 64mm] and iliac inguinal lymphadenopathy--> 07/23/23 switch to dose reduced FOLFOX --> Dec 2024 CT- stable disease. --> Feb 2025 disease progression--> longsurf + Bevacizumab   Labs are reviewed and discussed with patient   CEA nadir was in 490, progressively increases - 10,193  Currently on longsurf [60mg  BID day 1-5 and day 8- 12 every 28-day  + Bevacizumab  D1, D15 Bevacizumab  Hold chemotherapy due to high risk of obstruction/perforation. I discussed her care on tumor board.  04/10/2024 CT scan showed new,  eccentric, nodular wall thickening of hepatic flexure mass- 2.3 x 1.4 cm. Radiology feels this is less likely peristaltic changes, but a new colon mass which attributed to her most recent bowel obstruction event.  Discussed case with Dr. Jordis today. He will see patient for evaluation of feasibility of surgical intervention. If she is not a candidate, need Radonc evaluation to see if this site could be potentially radiated.  Patient and daughter may also seek second opinion at [name].  Prognosis is poor.    Anemia due to antineoplastic chemotherapy Hb stable close monitor.   Chemotherapy induced diarrhea continue lomotil   QID PRN as instructed.  .   Chemotherapy-induced neuropathy (HCC) Stable symptoms, Grade 1-2.patient copes well.   History of pulmonary embolism History of PE and bilateral lower extremity DVT  Continue  Eliquis  2.5 mg twice daily for anticoagulation prophylaxis. Recommend leg elevation and compression stocking.   Metastasis to skin [name]) S/p skin nodule biopsy confirmed metastatic adenocarcinoma consistent with colorectal origin.  No role for palliative RT per Radonc Follow up with wound care   Follow up TBD All questions were answered. The patient knows to call the clinic with any problems, questions or concerns.  Call Babara, MD, PhD [name] Hematology Oncology 05/15/2024      HISTORY OF PRESENTING ILLNESS:  Oncology History  Rectal cancer (HCC)  01/23/2019 Initial Diagnosis   Rectal cancer [facility]) Patient initially presented with complaints of postmenopausal bleeding on 08/16/2018.  History of was menopausal vaginal bleeding in 2016 which resulted in cervical polypectomy.  Pathology 02/04/2015 showed cervical polyp, consistent with benign endometrial polyp.  [name] after polypectomy due to anxiety associated with pelvic exams.  pelvic exam on 08/16/2018 reviewed cervical abnormality  and from enlarged uterus. Seen by Dr. Connell on 10/29/2018.   Endometrial biopsy and a Pap smear was performed. 10/29/2018 Pap smear showed adenocarcinoma, favor endometrial origin. 10/29/2018 endometrial biopsy showed endometrioid carcinoma, FIGO grade 1.  10/29/2018- TA & TV Ultrasound revealed: Anteverted uterus measuring 8.7 x 5.6 x 6.4 cm without evidence of focal masses.  The endometrium measuring 24.1 mm (thickened) and heterogeneous.  Right and left ovaries not visualized.  No adnexal masses identified.  No free fluid in cul-de-sac.  Patient was seen by Dr. Elby in clinic on 11/13/2018.  Cervical exam reveals 2 cm exophytic irregular mass consistent with malignancy.    11/19/2018 CT chest abdomen pelvis with contrast showed thickened endometrium with some irregularity compatible with the provided diagnosis of endometrial malignancy.  There is a mildly prominent left inguinal node 1.4 cm.  Patient was seen by Dr. Mancil on 11/20/2018 and left groin lymph node biopsy was recommended.  11/26/2018 patient underwent left inguinal lymph node biopsy. Pathology showed metastatic adenocarcinoma consistent with colorectal origin.  CDX 2+.  Case was discussed on tumor board.  Recommend colonoscopy for further evaluation.  Patient reports significant weight loss 30 pounds over the last year.  Chronic vaginal spotting. Change of bowel habits the past few months.  More constipated.  Family history positive for brother who has colon cancer prostate cancer.  patient has underwent colonoscopy on 12/03/2018 which reviewed a nonobstructing large mass in the rectum.  Also chronic fistula.  Mass was not circumferential.  This was biopsied with a cold forceps for histology.  Pathology came back hyperplastic polyp negative for dysplasia and malignancy. [name], patient underwent flex sigmoidoscopy on 12/06/2018 with rebiopsy of the rectal mass. This time biopsy results came back positive for invasive colorectal adenocarcinoma, moderately  differentiated. Immunotherapy for nearly mismatch repair protein (MMR ) was performed.  There is no loss of MMR expression.  low probability of MSI high.   # Seen by [name] for evaluation of resectability for rectal cancer. In addition, she also had a second opinion with [facility] pathology where her endometrial biopsy pathology was changed to  adenocarcinoma, consistent with colorectal primary.   [name]. She has home health that has been assisting with ostomy care  Patient was also evaluated by [facility] oncology.  Recommendation is to proceed with TNT with concurrent chemoradiation followed by neoadjuvant chemotherapy followed by surgical resection. Patient prefers to have treatment done locally with [facility].   # Oncology Treatment:  02/03/2019- 03/19/2019  concurrent Xeloda  and radiation.  Xeloda  dose 825mg  /m2 BID - rounded to 1650mg  BID- on days of radiation. 04/09/2019, started on FOLFOX with bolus early.  Omitted.  07/16/2019 finished 8 cycles of FOLFOX. 09/17/19 APR/posterior vaginectomy/TAH/BSO/VY-flap pT4b pN0 with close vaginal margin 0.2 mm.  Uterus and ovaries negative for malignancy.   10/06/2019 Cancer Staging   Staging form: Colon and Rectum, AJCC 8th Edition - Pathologic stage from 10/06/2019: Stage IIC (ypT4b, pN0, cM0) - Signed by Babara Call, MD on 10/06/2019   06/30/2020 Cancer Staging   Staging form: Colon and Rectum, AJCC 8th Edition - Clinical stage from 06/30/2020: [name] - Signed by Babara Call, MD on 04/17/2022 Stage prefix: Recurrence Total positive nodes: 1   06/30/2020 Relapse/Recurrence   vaginal introitus mass biopsied. Pathology is consistent with metastatic colorectal adenocarcinoma    07/20/2020 - 09/13/2020 Chemotherapy   Concurrent chemotherapy with Xeloda  with Radiation.  08/02/2020-08/16/2020, . Xeloda  was held due to  neutropenia    08/17/2020 - 09/06/2020 Radiation Therapy   Xeloda  1500 mg twice daily concurrently with radiation     01/31/2021 Cancer Staging   Staging form: Colon and Rectum, AJCC 8th Edition - Pathologic: Stage Unknown (rpTX, pNX, cM1) - Signed by Babara Call, MD on 01/31/2021 Stage prefix: Recurrence   01/31/2021 - 08/20/2021 Chemotherapy   FOLFIRI + Bev  Irinotecan  dose was reduced, eventually 100mg /m2  Irinotecan  was dropped in November 2022 due to side effects. Negative for UGT1A1*28    09/13/2021 -  Chemotherapy   maintenance 5-FU/bevacizumab     11/28/2021 -  Chemotherapy   5-FU/Irinotecan /bevacizumab . Irinotecan  100 mg/m2 was added back due to progressively increasing CEA.    07/14/2022 Imaging   Bone scan  1. No evidence of skeletal metastatic disease in the hips. 2. Asymmetric increased activity in the RIGHT humeral head is favored degenerative as described above. If pain in the RIGHT shoulder consider further evaluation with plain film or MRI   07/17/2022 Imaging   1. Stable scattered small bilateral pulmonary nodules, largest 8 by 6 mm in the right middle lobe. 2. Subtle chronically stable posterior cortical irregularity and heterogeneity in the left iliac bone, not appreciable on the bone scan of 07/14/2022. This has been present at least over the past year and could reflect radiation therapy related findings although strictly speaking, malignant involvement of the left iliac bone is difficult to exclude. If clinically warranted this could be further assessed with nuclear medicine PET-CT or MRI of the bony pelvis with and without contrast. 3. Pelvic floor laxity with loops of small bowel extending 3 cm below the pubococcygeal line. 4. Aortic and systemic atherosclerosis as detailed above. Aortic Atherosclerosis    12/26/2022 Imaging   CT chest abdomen pelvis w contrast 1. Stable scattered solid pulmonary nodules. 2. Right inguinal and right pelvic lymph nodes which were hypermetabolic on prior PET-CT are stable. 3. Osseous lesion of the proximal right humerus demonstrates increased sclerosis  when compared with the prior PET-CT, possibly due to treatment response. 4. 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 5. Aortic Atherosclerosis     06/25/2023 Imaging   CT chest abdomen pelvis w contrast showed 1. Increased size of the solid bilateral pulmonary nodules,consistent with worsening metastatic disease. 2. Increased size of the right external iliac and inguinal lymph nodes, concerning for worsening metastatic disease. 3. No significant interval change in the left iliac bone osseous metastasis. 4. Enlarged main pulmonary artery measuring 3.6 cm, which can be seen in the setting of pulmonary arterial hypertension. 5.  Aortic Atherosclerosis (ICD10-I70.0)     Miscellaneous   Tempus liquid biopsy showed no actionable variants.  NOTCH1 missense variant, BRCA2 missense variant , MYC missense variant   07/11/2023 Imaging   PET showed 1. Multifocal tracer avid abdominopelvic lymph nodes are identified compatible with metastatic disease. 2. Multifocal bilateral pulmonary nodules are identified compatible with pulmonary metastasis.  3. Diffuse increased radiotracer uptake throughout the marrow space is identified diminishing sensitivity for detecting osseous metastasis. No abnormal increased radiotracer uptake identified above background increased bone marrow activity to indicate metabolically active bone metastases. 4.  Aortic Atherosclerosis   07/23/2023 - 11/13/2023 Chemotherapy   switch to dose reduced FOLFOX    09/16/2023 Imaging   CT abdomen pelvis w contrast  1. Small-bowel obstruction with transition point in the anterior aspect of the lower abdomen, left of midline. This may be secondary to adhesions. 2. Small volume ascites. 3. Prior colon resection with left anterior abdominal  wall colostomy. Adjacent parastomal hernia. Small volume fluid within the hernia sac. 4. Stable pulmonary metastatic disease. 5. Stable metastatic lesion of the left iliac wing. 6.  Enlarged right external iliac chain lymph node measuring 1.4 cm, previously 1.3 cm. Several mildly prominent retroperitoneal lymph nodes have increased in size from prior but remain subcentimeter in size. 7. Aortic atherosclerosis (ICD10-I70.0).   09/16/2023 - 09/20/2023 Hospital Admission   Hospitalized due to Small bowel obstruction due to adhesions    09/24/2023 Imaging   1. Bilateral pulmonary metastases are all stable from 06/25/2023 chest CT. 2. No thoracic adenopathy. No new or progressive metastatic disease in the chest. 3. Trace bilateral posterior pleural effusions. 4. Two-vessel coronary atherosclerosis. 5. Dilated main pulmonary artery, suggesting pulmonary arterial hypertension. 6.  Aortic Atherosclerosis (ICD10-I70.0).   11/28/2023 Imaging   CT chest abdomen pelvis with contrast 1. Increased size of retroperitoneal, iliac side chain and inguinal lymph nodes, concerning for progressive nodal metastatic disease. 2. Numerous bilateral solid pulmonary nodules are again seen, some of which have increased in size and others are stable in size overall compatible with disease progression. 3. Increased size of the subcapsular lesion in the anterior inferior right lobe of the liver, which has been stable over multiple prior examinations and was not FDG avid on prior PET-CT. Suggest attention on follow-up imaging. 4. 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 5. Questionable wall thickening of matted loops of small bowel in the left lower quadrant, suggest attention on follow-up imaging. 6. Small volume abdominopelvic free fluid. 7.  Aortic Atherosclerosis (ICD10-I70.0).   12/13/2023 -  Chemotherapy   Patient is on Treatment Plan : COLORECTAL Bevacizumab  + Trifluridine /Tipiracil  q28d     01/01/2024 Procedure   Right inguinal crease skin nodule biopsy  Pathology showed dermal deposits of metastatic colon adenocarcinoma, extending to the  edge and base.      01/14/2024 Imaging   PET scan showed  1. Progressive disease with progressive left supraclavicular adenopathy, progressive pulmonary metastatic disease, progressive abdominal and pelvic adenopathy, and progressive multifocal skeletal involvement. 2. Substantial multifocal skeletal involvement including the right proximal humerus, right medial clavicle, left T11 vertebral body, L4 posterior elements, and entire left iliac crest. The permeative lesion of the right humerus may have some extraosseous extension. Similarly there is cortical breakthrough in the large left iliac crest lesion. 3. Hypermetabolic irregular tumor extends along the right inguinal ring region and right hip adductor musculature as well as in the subcutaneous and cutaneous tissues along the right inguinal fold, compatible with tumor involvement. 4. Right eccentric vulvar activity just below the clitoris has a maximum SUV of 7.5, and given the regional soft tissue metastatic disease, a metastatic focus in this vicinity is not excluded. 5. The subcapsular lesion along the anterior inferior right hepatic lobe described on CT of 11/28/2023 is not discernibly hypermetabolic or visible on today's PET-CT. 6. Descending left colostomy with mild diverticulosis. 7.  Aortic Atherosclerosis (ICD10-I70.0).    04/10/2024 Imaging   CT chest abdomen pelvis w contrast showed 1. Status post abdominoperineal resection with left lower quadrant end colostomy. 2. New, eccentric, nodular wall thickening of the colon at the hepatic flexure measuring 2.3 x 1.4 cm. This may be transient and peristaltic however a metastatic peritoneal implant or metachronous colon malignancy not excluded. Attention on follow-up. 3. Otherwise no significant change in previously FDG avid widespread pulmonary, nodal, and osseous metastatic disease, as well as soft tissue involvement of the right lower quadrant  abdominal wall  and pannus. 4. Coronary artery disease.     Metastatic adenocarcinoma (HCC)  12/13/2023 -  Chemotherapy   Patient is on Treatment Plan : COLORECTAL Bevacizumab  + Trifluridine /Tipiracil  q28d         INTERVAL HISTORY Jacqueline Yoder is a 78 y.o. female who has above history reviewed by me presents for follow-up of rectal cancer. She was accompanied by her daughter. Appetite is not good Recent hospitalization due to bowel obstruction, managed medically. Today she denies abdominal pain. + some mild discomfort + chronic loose ostomy output, stable. + right groin skin mets, open wound, follows up with wound care.  Weight is stable.   Review of Systems  Constitutional:  Positive for fatigue. Negative for appetite change, chills, fever and unexpected weight change.  HENT:   Negative for hearing loss and voice change.   Eyes:  Negative for eye problems.  Respiratory:  Negative for chest tightness and cough.   Cardiovascular:  Negative for chest pain.  Gastrointestinal:  Negative for abdominal distention, abdominal pain, blood in stool, constipation, diarrhea and nausea.  Endocrine: Negative for hot flashes.  Genitourinary:  Negative for difficulty urinating and frequency.   Musculoskeletal:  Positive for arthralgias.        right shoulder pain.   Skin:  Positive for rash. Negative for itching.  Neurological:  Positive for numbness. Negative for extremity weakness.  Hematological:  Negative for adenopathy.  Psychiatric/Behavioral:  Negative for confusion.     MEDICAL HISTORY:  Past Medical History:  Diagnosis Date   Allergy    Arthritis    Blood clot in vein    Family history of colon cancer    GERD (gastroesophageal reflux disease)    Hypercholesteremia    Hypertension    Hypertension    Lower extremity edema    Personal history of chemotherapy    Rectal cancer (HCC) 12/2018   Urinary incontinence     SURGICAL HISTORY: Past Surgical History:  Procedure Laterality Date    ABDOMINAL HYSTERECTOMY     CHOLECYSTECTOMY  1971   COLONOSCOPY WITH PROPOFOL  N/A 12/03/2018   Procedure: COLONOSCOPY WITH PROPOFOL ;  Surgeon: Jinny Carmine, MD;  Location: [facility] ENDOSCOPY;  Service: Endoscopy;  Laterality: N/A;   COLONOSCOPY WITH PROPOFOL  N/A 07/15/2020   Procedure: COLONOSCOPY WITH PROPOFOL ;  Surgeon: Therisa Bi, MD;  Location: [facility] ENDOSCOPY;  Service: Gastroenterology;  Laterality: N/A;   FLEXIBLE SIGMOIDOSCOPY N/A 12/06/2018   Procedure: FLEXIBLE SIGMOIDOSCOPY;  Surgeon: Therisa Bi, MD;  Location: [facility] ENDOSCOPY;  Service: Endoscopy;  Laterality: N/A;   LAPAROSCOPIC COLOSTOMY  01/06/2019   PORTACATH PLACEMENT N/A 04/03/2019   Procedure: INSERTION PORT-A-CATH;  Surgeon: Jordis Laneta JULIANNA, MD;  Location: [facility] ORS;  Service: General;  Laterality: N/A;    SOCIAL HISTORY: Social History   Socioeconomic History   Marital status: Widowed    Spouse name: Not on file   Number of children: Not on file   Years of education: Not on file   Highest education level: Some college, no degree  Occupational History   Not on file  Tobacco Use   Smoking status: Former    Current packs/day: 0.00    Types: Cigarettes    Quit date: 12/02/1977    Years since quitting: 46.4   Smokeless tobacco: Former  Building services engineer status: Never Used  Substance and Sexual Activity   Alcohol use: Never   Drug use: Never   Sexual activity: Not Currently    Birth control/protection: None  Other Topics Concern   Not on file  Social History Narrative   Lives with daughter   Social Drivers of Health   Financial Resource Strain: Low Risk  (11/15/2023)   Overall Financial Resource Strain (CARDIA)    Difficulty of Paying Living Expenses: Not hard at all  Food Insecurity: No Food Insecurity (05/04/2024)   Hunger Vital Sign    Worried About Running Out of Food in the Last Year: Never true    Ran Out of Food in the Last Year: Never true  Transportation Needs: No Transportation Needs (05/04/2024)    PRAPARE - Administrator, Civil Service (Medical): No    Lack of Transportation (Non-Medical): No  Physical Activity: Insufficiently Active (11/15/2023)   Exercise Vital Sign    Days of Exercise per Week: 3 days    Minutes of Exercise per Session: 10 min  Stress: No Stress Concern Present (11/15/2023)   [employer] of Occupational Health - Occupational Stress Questionnaire    Feeling of Stress : Not at all  Social Connections: Moderately Integrated (05/04/2024)   Social Connection and Isolation Panel    Frequency of Communication with Friends and Family: More than three times a week    Frequency of Social Gatherings with Friends and Family: More than three times a week    Attends Religious Services: More than 4 times per year    Active Member of [employer] or Organizations: Yes    Attends Banker Meetings: More than 4 times per year    Marital Status: Widowed  Intimate Partner Violence: Not At Risk (05/04/2024)   Humiliation, Afraid, Rape, and Kick questionnaire    Fear of Current or Ex-Partner: No    Emotionally Abused: No    Physically Abused: No    Sexually Abused: No    FAMILY HISTORY: Family History  Problem Relation Age of Onset   Colon cancer Brother 27       exposure to chemicals [place]   Hypertension Mother    Stroke Mother    Kidney failure Father    Breast cancer Neg Hx    Ovarian cancer Neg Hx     ALLERGIES:  is allergic to sulfamethoxazole-trimethoprim.  MEDICATIONS:  Current Outpatient Medications  Medication Sig Dispense Refill   acetaminophen  (TYLENOL ) 650 MG CR tablet Take 650 mg by mouth every 8 (eight) hours as needed for pain.     apixaban  (ELIQUIS ) 2.5 MG TABS tablet Take 1 tablet (2.5 mg total) by mouth 2 (two) times daily. 200 tablet 2   Cholecalciferol  (VITAMIN D3) 2000 units capsule Take 2,000 Units by mouth daily.     diphenoxylate -atropine  (LOMOTIL ) 2.5-0.025 MG tablet Take 1 tablet by mouth 4 (four) times daily as needed  for diarrhea or loose stools. 120 tablet 0   fluticasone  (FLONASE ) 50 MCG/ACT nasal spray Place 1 spray into both nostrils daily as needed for allergies or rhinitis.     ipratropium (ATROVENT ) 0.03 % nasal spray Place 2 sprays into both nostrils 2 (two) times daily.     lidocaine -prilocaine  (EMLA ) cream Apply 1 Application topically daily as needed. Apply small amount to port and cover with saran wrap 1-2 hours prior to port access 30 g 3   loratadine  (CLARITIN ) 10 MG tablet Take 10 mg by mouth daily.     magnesium  chloride (SLOW-MAG) 64 MG TBEC SR tablet Take 1 tablet (64 mg total) by mouth 2 (two) times daily. 60 tablet 2   megestrol  (MEGACE ) 40 MG/ML suspension Take  40 mg by mouth 2 (two) times daily.     Multiple Vitamins-Minerals (ONE-A-DAY WOMENS 50 PLUS PO) Take 1 tablet by mouth daily.      nystatin  (MYCOSTATIN /NYSTOP ) powder Apply 1 Application topically daily.     ondansetron  (ZOFRAN ) 8 MG tablet Take 1 tablet (8 mg total) by mouth every 8 (eight) hours as needed for nausea or vomiting. 60 tablet 1   potassium chloride  SA (KLOR-CON  M) 20 MEQ tablet TAKE 1 TABLET BY MOUTH DAILY (Patient taking differently: Take 20 mEq by mouth 2 (two) times daily.) 100 tablet 1   simvastatin  (ZOCOR ) 40 MG tablet Take 1 tablet (40 mg total) by mouth at bedtime. 100 tablet 3   triamterene -hydrochlorothiazide  (DYAZIDE ) 37.5-25 MG capsule TAKE 1 CAPSULE BY MOUTH EACH  DAILY 90 capsule 1   trifluridine -tipiracil  (LONSURF ) 20-8.19 MG tablet Take 3 tablets (60 mg of trifluridine  total) by mouth 2 (two) times daily. 1hr after AM & PM meals days 1-5, 8-12. Repeat every 28d. 60 tablet 1   zinc  gluconate 50 MG tablet Take 50 mg by mouth daily.     tiZANidine  (ZANAFLEX ) 4 MG tablet Take 0.5 tablets (2 mg total) by mouth every 8 (eight) hours as needed for muscle spasms. (Patient not taking: Reported on 05/15/2024) 10 tablet 0   No current facility-administered medications for this visit.   Facility-Administered  Medications Ordered in Other Visits  Medication Dose Route Frequency Provider Last Rate Last Admin   heparin  lock flush 100 UNIT/ML injection              PHYSICAL EXAMINATION: ECOG PERFORMANCE STATUS: 1 - Symptomatic but completely ambulatory  Physical Exam Constitutional:      General: She is not in acute distress. HENT:     Head: Normocephalic and atraumatic.  Eyes:     General: No scleral icterus. Cardiovascular:     Rate and Rhythm: Normal rate.     Heart sounds: Normal heart sounds.  Pulmonary:     Effort: Pulmonary effort is normal. No respiratory distress.     Breath sounds: No wheezing.  Abdominal:     General: Bowel sounds are normal. There is no distension.     Palpations: Abdomen is soft.     Comments: + Colostomy bag   Musculoskeletal:        General: No deformity. Normal range of motion.     Cervical back: Normal range of motion and neck supple.     Right lower leg: Edema present.  Skin:    General: Skin is warm and dry.     Comments: Right groin skin nodules with open wound covered with dressing.   Neurological:     Mental Status: She is alert and oriented to person, place, and time. Mental status is at baseline.       LABORATORY DATA:  I have reviewed the data as listed    Latest Ref Rng & Units 05/15/2024    8:41 AM 05/07/2024    9:33 AM 05/05/2024    9:23 AM  CBC  WBC 4.0 - 10.5 K/uL 6.7  6.7  3.4   Hemoglobin 12.0 - 15.0 g/dL 88.7  88.5  9.6   Hematocrit 36.0 - 46.0 % 33.8  34.8  28.8   Platelets 150 - 400 K/uL 225  177  150       Latest Ref Rng & Units 05/15/2024    8:41 AM 05/07/2024    9:33 AM 05/05/2024    9:23 AM  CMP  Glucose 70 - 99 mg/dL 853  897  96   BUN 8 - 23 mg/dL 17  10  15    Creatinine 0.44 - 1.00 mg/dL 9.19  9.23  9.16   Sodium 135 - 145 mmol/L 135  134  138   Potassium 3.5 - 5.1 mmol/L 3.6  3.6  3.3   Chloride 98 - 111 mmol/L 104  104  105   CO2 22 - 32 mmol/L 23  23  23    Calcium  8.9 - 10.3 mg/dL 9.1  9.5  8.9   Total  Protein 6.5 - 8.1 g/dL 7.2  7.3    Total Bilirubin 0.0 - 1.2 mg/dL 0.6  0.5    Alkaline Phos 38 - 126 U/L 72  80    AST 15 - 41 U/L 38  28    ALT 0 - 44 U/L 24  24       RADIOGRAPHIC STUDIES: I have personally reviewed the radiological images as listed and agreed with the findings in the report. DG Abd 2 Views Result Date: 05/04/2024 CLINICAL DATA:  Small-bowel obstruction follow-up. History of metastatic rectal cancer. EXAM: ABDOMEN - 2 VIEW COMPARISON:  CT AP from 05/04/2024 FINDINGS: Persistent dilated loops of bowel noted within the lower abdomen and pelvis measuring up to 3.9 cm corresponding to the bowel obstruction from earlier today. No signs of pneumoperitoneum. Visualized osseous structures appear grossly intact. IV contrast material from recent CT is noted within renal the collecting systems and bladder. IMPRESSION: Persistent dilated loops of bowel within the lower abdomen and pelvis corresponding to the bowel obstruction from earlier today. Electronically Signed   By: Waddell Calk M.D.   On: 05/04/2024 09:18   CT ABDOMEN PELVIS W CONTRAST Result Date: 05/04/2024 CLINICAL DATA:  Epigastric pain and nausea. EXAM: CT ABDOMEN AND PELVIS WITH CONTRAST TECHNIQUE: Multidetector CT imaging of the abdomen and pelvis was performed using the standard protocol following bolus administration of intravenous contrast. RADIATION DOSE REDUCTION: This exam was performed according to the departmental dose-optimization program which includes automated exposure control, adjustment of the mA and/or kV according to patient size and/or use of iterative reconstruction technique. CONTRAST:  OMNIPAQUE  IOHEXOL  300 MG/ML  SOLN COMPARISON:  April 10, 2024 FINDINGS: Lower chest: Multiple subcentimeter pulmonary nodules are seen within the left upper lobe right middle lobe and bilateral lower lobes (the largest measures approximately 4 mm). A stable 15 mm x 14 mm pulmonary nodule is also seen within the  anteromedial aspect of the right middle lobe. Hepatobiliary: There is diffuse fatty infiltration of the liver parenchyma. No focal liver abnormality is seen. Status post cholecystectomy. No biliary dilatation. Pancreas: Unremarkable. No pancreatic ductal dilatation or surrounding inflammatory changes. Spleen: Normal in size without focal abnormality. Adrenals/Urinary Tract: Adrenal glands are unremarkable. Kidneys are normal in size, without renal calculi or hydronephrosis. Multiple stable bilateral simple renal cysts are seen. Bladder is unremarkable. Stomach/Bowel: Stomach is within normal limits. Appendix appears normal. Mildly dilated small bowel loops are seen within the mid and lower abdomen (maximum small bowel diameter of approximately 3.2 cm). A transition zone is suspected within the pelvis. There is evidence of prior abdominoperineal resection with a left lower quadrant ostomy site noted. Noninflamed diverticula are seen within the remaining visualized large bowel. There is a 5.0 cm x 6.3 cm parastomal hernia which contains fat and a segment of large bowel. Vascular/Lymphatic: Aortic atherosclerosis. No enlarged abdominal or pelvic lymph nodes. Reproductive: Status post hysterectomy. No adnexal masses. Other: No  abdominopelvic ascites. Musculoskeletal: Multilevel degenerative changes are seen throughout the lumbar spine. IMPRESSION: 1. Findings consistent with a distal partial small bowel obstruction. 2. Evidence of prior abdominoperineal resection with a left lower quadrant ostomy site. 3. 5.0 cm x 6.3 cm parastomal hernia which contains fat and a segment of large bowel. 4. Colonic diverticulosis. 5. Multiple subcentimeter pulmonary nodules within the left upper lobe right middle lobe and bilateral lower lobes, as described above. 6. Stable 15 mm x 14 mm right middle lobe pulmonary nodule. 7. Hepatic steatosis. 8. Evidence of prior cholecystectomy and hysterectomy. 9. Aortic atherosclerosis.  Electronically Signed   By: Suzen Dials M.D.   On: 05/04/2024 02:18   CT CHEST ABDOMEN PELVIS W CONTRAST Result Date: 04/16/2024 CLINICAL DATA:  Metastatic rectal cancer restaging * Tracking Code: BO * EXAM: CT CHEST, ABDOMEN, AND PELVIS WITH CONTRAST TECHNIQUE: Multidetector CT imaging of the chest, abdomen and pelvis was performed following the standard protocol during bolus administration of intravenous contrast. RADIATION DOSE REDUCTION: This exam was performed according to the departmental dose-optimization program which includes automated exposure control, adjustment of the mA and/or kV according to patient size and/or use of iterative reconstruction technique. CONTRAST:  85mL OMNIPAQUE  IOHEXOL  300 MG/ML  SOLN COMPARISON:  PET-CT, 01/14/2024 FINDINGS: CT CHEST FINDINGS Cardiovascular: Aortic atherosclerosis. Normal heart size. Left coronary artery calcifications. No pericardial effusion. Mediastinum/Nodes: No significant change in enlarged lower left cervical lymph node measuring 2.1 x 1.3 cm, previously FDG avid (series 2, image 6). Thyroid  gland, trachea, and esophagus demonstrate no significant findings. Lungs/Pleura: Numerous bilateral pulmonary nodules not significantly changed, largest index nodule of the medial segment right middle lobe measuring 1.5 x 1.4 cm (series 3, image 99), additional index nodule in the peripheral left upper lobe measuring 0.7 cm (series 3, image 40). No pleural effusion or pneumothorax. Musculoskeletal: No chest wall abnormality. No acute osseous findings. CT ABDOMEN PELVIS FINDINGS Hepatobiliary: No focal liver abnormality is seen. Status post cholecystectomy. No biliary dilatation. Pancreas: Unremarkable. No pancreatic ductal dilatation or surrounding inflammatory changes. Spleen: Normal in size without significant abnormality. Adrenals/Urinary Tract: Adrenal glands are unremarkable. Multiple benign bilateral renal cortical cysts, for which no further follow-up or  characterization is required. Kidneys are otherwise normal, without renal calculi, solid lesion, or hydronephrosis. Bladder is unremarkable. Stomach/Bowel: Stomach is within normal limits. Normal appendix. Status post abdominoperineal resection with left lower quadrant end colostomy. New, eccentric, nodular wall thickening of the colon at the hepatic flexure measuring 2.3 x 1.4 cm (series 2, image 69). Vascular/Lymphatic: Aortic atherosclerosis. Similar appearance of numerous matted, previously FDG avid retroperitoneal and pelvic lymph nodes, index left retroperitoneal node or conglomerate measuring 2.0 x 1.4 cm (series 2, image 66). Unchanged index right external iliac node measures 1.8 x 1.4 cm with matted soft tissue about the right common femoral artery likewise unchanged (series 2, image 78, 104). Reproductive: Status post hysterectomy. Other: Left lower quadrant end colostomy with a small parastomal hernia. Large abdominal pannus with skin thickening and nodularity of both the external surface and underlying fold, previously FDG avid (series 2, image 106). No ascites. Musculoskeletal: No acute osseous findings. Numerous sclerotic osseous metastases involving the axial and proximal appendicular skeleton, for example of the proximal right humerus, right clavicular head and left ilium CT appearance not significantly changed. IMPRESSION: 1. Status post abdominoperineal resection with left lower quadrant end colostomy. 2. New, eccentric, nodular wall thickening of the colon at the hepatic flexure measuring 2.3 x 1.4 cm. This may be transient and peristaltic  however a metastatic peritoneal implant or metachronous colon malignancy not excluded. Attention on follow-up. 3. Otherwise no significant change in previously FDG avid widespread pulmonary, nodal, and osseous metastatic disease, as well as soft tissue involvement of the right lower quadrant abdominal wall and pannus. 4. Coronary artery disease. Aortic  Atherosclerosis (ICD10-I70.0). Electronically Signed   By: Marolyn JONETTA Jaksch M.D.   On: 04/16/2024 06:45      DG Abd 2 Views Result Date: 05/04/2024 CLINICAL DATA:  Small-bowel obstruction follow-up. History of metastatic rectal cancer. EXAM: ABDOMEN - 2 VIEW COMPARISON:  CT AP from 05/04/2024 FINDINGS: Persistent dilated loops of bowel noted within the lower abdomen and pelvis measuring up to 3.9 cm corresponding to the bowel obstruction from earlier today. No signs of pneumoperitoneum. Visualized osseous structures appear grossly intact. IV contrast material from recent CT is noted within renal the collecting systems and bladder. IMPRESSION: Persistent dilated loops of bowel within the lower abdomen and pelvis corresponding to the bowel obstruction from earlier today. Electronically Signed   By: Waddell Calk M.D.   On: 05/04/2024 09:18   CT ABDOMEN PELVIS W CONTRAST Result Date: 05/04/2024 CLINICAL DATA:  Epigastric pain and nausea. EXAM: CT ABDOMEN AND PELVIS WITH CONTRAST TECHNIQUE: Multidetector CT imaging of the abdomen and pelvis was performed using the standard protocol following bolus administration of intravenous contrast. RADIATION DOSE REDUCTION: This exam was performed according to the departmental dose-optimization program which includes automated exposure control, adjustment of the mA and/or kV according to patient size and/or use of iterative reconstruction technique. CONTRAST:  OMNIPAQUE  IOHEXOL  300 MG/ML  SOLN COMPARISON:  April 10, 2024 FINDINGS: Lower chest: Multiple subcentimeter pulmonary nodules are seen within the left upper lobe right middle lobe and bilateral lower lobes (the largest measures approximately 4 mm). A stable 15 mm x 14 mm pulmonary nodule is also seen within the anteromedial aspect of the right middle lobe. Hepatobiliary: There is diffuse fatty infiltration of the liver parenchyma. No focal liver abnormality is seen. Status post cholecystectomy. No biliary  dilatation. Pancreas: Unremarkable. No pancreatic ductal dilatation or surrounding inflammatory changes. Spleen: Normal in size without focal abnormality. Adrenals/Urinary Tract: Adrenal glands are unremarkable. Kidneys are normal in size, without renal calculi or hydronephrosis. Multiple stable bilateral simple renal cysts are seen. Bladder is unremarkable. Stomach/Bowel: Stomach is within normal limits. Appendix appears normal. Mildly dilated small bowel loops are seen within the mid and lower abdomen (maximum small bowel diameter of approximately 3.2 cm). A transition zone is suspected within the pelvis. There is evidence of prior abdominoperineal resection with a left lower quadrant ostomy site noted. Noninflamed diverticula are seen within the remaining visualized large bowel. There is a 5.0 cm x 6.3 cm parastomal hernia which contains fat and a segment of large bowel. Vascular/Lymphatic: Aortic atherosclerosis. No enlarged abdominal or pelvic lymph nodes. Reproductive: Status post hysterectomy. No adnexal masses. Other: No abdominopelvic ascites. Musculoskeletal: Multilevel degenerative changes are seen throughout the lumbar spine. IMPRESSION: 1. Findings consistent with a distal partial small bowel obstruction. 2. Evidence of prior abdominoperineal resection with a left lower quadrant ostomy site. 3. 5.0 cm x 6.3 cm parastomal hernia which contains fat and a segment of large bowel. 4. Colonic diverticulosis. 5. Multiple subcentimeter pulmonary nodules within the left upper lobe right middle lobe and bilateral lower lobes, as described above. 6. Stable 15 mm x 14 mm right middle lobe pulmonary nodule. 7. Hepatic steatosis. 8. Evidence of prior cholecystectomy and hysterectomy. 9. Aortic atherosclerosis. Electronically Signed  By: Suzen Dials M.D.   On: 05/04/2024 02:18   CT CHEST ABDOMEN PELVIS W CONTRAST Result Date: 04/16/2024 CLINICAL DATA:  Metastatic rectal cancer restaging * Tracking Code: BO *  EXAM: CT CHEST, ABDOMEN, AND PELVIS WITH CONTRAST TECHNIQUE: Multidetector CT imaging of the chest, abdomen and pelvis was performed following the standard protocol during bolus administration of intravenous contrast. RADIATION DOSE REDUCTION: This exam was performed according to the departmental dose-optimization program which includes automated exposure control, adjustment of the mA and/or kV according to patient size and/or use of iterative reconstruction technique. CONTRAST:  85mL OMNIPAQUE  IOHEXOL  300 MG/ML  SOLN COMPARISON:  PET-CT, 01/14/2024 FINDINGS: CT CHEST FINDINGS Cardiovascular: Aortic atherosclerosis. Normal heart size. Left coronary artery calcifications. No pericardial effusion. Mediastinum/Nodes: No significant change in enlarged lower left cervical lymph node measuring 2.1 x 1.3 cm, previously FDG avid (series 2, image 6). Thyroid  gland, trachea, and esophagus demonstrate no significant findings. Lungs/Pleura: Numerous bilateral pulmonary nodules not significantly changed, largest index nodule of the medial segment right middle lobe measuring 1.5 x 1.4 cm (series 3, image 99), additional index nodule in the peripheral left upper lobe measuring 0.7 cm (series 3, image 40). No pleural effusion or pneumothorax. Musculoskeletal: No chest wall abnormality. No acute osseous findings. CT ABDOMEN PELVIS FINDINGS Hepatobiliary: No focal liver abnormality is seen. Status post cholecystectomy. No biliary dilatation. Pancreas: Unremarkable. No pancreatic ductal dilatation or surrounding inflammatory changes. Spleen: Normal in size without significant abnormality. Adrenals/Urinary Tract: Adrenal glands are unremarkable. Multiple benign bilateral renal cortical cysts, for which no further follow-up or characterization is required. Kidneys are otherwise normal, without renal calculi, solid lesion, or hydronephrosis. Bladder is unremarkable. Stomach/Bowel: Stomach is within normal limits. Normal appendix. Status  post abdominoperineal resection with left lower quadrant end colostomy. New, eccentric, nodular wall thickening of the colon at the hepatic flexure measuring 2.3 x 1.4 cm (series 2, image 69). Vascular/Lymphatic: Aortic atherosclerosis. Similar appearance of numerous matted, previously FDG avid retroperitoneal and pelvic lymph nodes, index left retroperitoneal node or conglomerate measuring 2.0 x 1.4 cm (series 2, image 66). Unchanged index right external iliac node measures 1.8 x 1.4 cm with matted soft tissue about the right common femoral artery likewise unchanged (series 2, image 78, 104). Reproductive: Status post hysterectomy. Other: Left lower quadrant end colostomy with a small parastomal hernia. Large abdominal pannus with skin thickening and nodularity of both the external surface and underlying fold, previously FDG avid (series 2, image 106). No ascites. Musculoskeletal: No acute osseous findings. Numerous sclerotic osseous metastases involving the axial and proximal appendicular skeleton, for example of the proximal right humerus, right clavicular head and left ilium CT appearance not significantly changed. IMPRESSION: 1. Status post abdominoperineal resection with left lower quadrant end colostomy. 2. New, eccentric, nodular wall thickening of the colon at the hepatic flexure measuring 2.3 x 1.4 cm. This may be transient and peristaltic however a metastatic peritoneal implant or metachronous colon malignancy not excluded. Attention on follow-up. 3. Otherwise no significant change in previously FDG avid widespread pulmonary, nodal, and osseous metastatic disease, as well as soft tissue involvement of the right lower quadrant abdominal wall and pannus. 4. Coronary artery disease. Aortic Atherosclerosis (ICD10-I70.0). Electronically Signed   By: Marolyn JONETTA Jaksch M.D.   On: 04/16/2024 06:45

## 2024-05-15 NOTE — Assessment & Plan Note (Signed)
 Stable symptoms, Grade 1-2.patient copes well.

## 2024-05-16 ENCOUNTER — Other Ambulatory Visit: Payer: Self-pay

## 2024-05-16 ENCOUNTER — Other Ambulatory Visit: Payer: Self-pay | Admitting: Pharmacy Technician

## 2024-05-16 NOTE — Progress Notes (Signed)
 Oral Oncology Patient Advocate Encounter  Per MD note on 05/15/2024, patient has stopped L[place]f . Disenrolling patient.  Valory Wetherby (Patty) Chet Burnet, CPhT  [facility], [place]da [place], [facility] Patient Advocate Specialist III Phone: [phone]  Fax: [phone]

## 2024-05-17 DIAGNOSIS — Z7901 Long term (current) use of anticoagulants: Secondary | ICD-10-CM | POA: Diagnosis not present

## 2024-05-17 DIAGNOSIS — Z933 Colostomy status: Secondary | ICD-10-CM | POA: Diagnosis not present

## 2024-05-19 ENCOUNTER — Other Ambulatory Visit (HOSPITAL_COMMUNITY): Payer: Self-pay

## 2024-05-19 ENCOUNTER — Encounter: Payer: Self-pay | Admitting: Family Medicine

## 2024-05-19 ENCOUNTER — Ambulatory Visit: Admitting: Family Medicine

## 2024-05-19 VITALS — BP 122/64 | HR 99 | Ht 64.0 in | Wt 152.1 lb

## 2024-05-19 DIAGNOSIS — R5381 Other malaise: Secondary | ICD-10-CM

## 2024-05-19 DIAGNOSIS — C799 Secondary malignant neoplasm of unspecified site: Secondary | ICD-10-CM | POA: Diagnosis not present

## 2024-05-19 DIAGNOSIS — M1612 Unilateral primary osteoarthritis, left hip: Secondary | ICD-10-CM

## 2024-05-19 NOTE — Progress Notes (Signed)
 Subjective:    Patient ID: Jacqueline Yoder, female    DOB: [birth year], 78 y.o.   MRN: [id number]  Jacqueline Yoder is a 78 y.o. female presenting on 05/19/2024 for Hospitalization Follow-up   HPI  Discussed the use of AI scribe software for clinical note transcription with the patient, who gave verbal consent to proceed.  History of Present Illness   Jacqueline Yoder is a 78 year old female who presents for follow-up after a recent hospitalization for partial small bowel obstruction.   HOSPITAL FOLLOW-UP VISIT  Hospital/Location: [facility] Date of Admission: 05/04/24 Date of Discharge: 05/05/24 Transitions of care telephone call: [facility] Lanette Hamilton LPN  Reason for Admission: Partial SBO  - Hospital H&P and Discharge Summary have been reviewed - Patient presents today 14 days after recent hospitalization.   - Hospitalized from July 20th to July 21st for partial small bowel obstruction - Obstruction resolved without surgical intervention - Identified new colonic mass as etiology for SBO - Since discharge, persistent fatigue and slow recovery compared to previous illnesses - Diet includes Boost and Ensure to maintain nutrition - Diet advanced to normal diet, solids  Metastatic Colorectal Cancer Followed by [facility] Babara Given recurrence of metastatic disease, limited other chemotherapy or treatment options available at this time. She has been advised okay to proceed w/ 2nd opinion through [facility] as next step. Scheduled for later this month August 2025  Constitutional symptoms - Persistent fatigue since hospitalization - Energy levels have not returned to baseline - Recent weight loss to 149 pounds following obstruction, with subsequent regain to 152 pounds  Musculoskeletal symptoms - Joint stiffness, particularly in the legs - Describes sensation as 'whole body stiff'  Autonomic symptoms - Sweating with exertion - Feeling jittery since discharge  Anemia, macrocytic Recently  had Hgb 9.6, received transfusion and IV iron infusion - Low red blood cell count following hospitalization   - Today reports overall has done well after discharge  - New medications on discharge: None - Changes to current meds on discharge: None  I have reviewed the discharge medication list, and have reconciled the current and discharge medications today.   Current Outpatient Medications:    acetaminophen  (TYLENOL ) 650 MG CR tablet, Take 650 mg by mouth every 8 (eight) hours as needed for pain., Disp: , Rfl:    apixaban  (ELIQUIS ) 2.5 MG TABS tablet, Take 1 tablet (2.5 mg total) by mouth 2 (two) times daily., Disp: 200 tablet, Rfl: 2   Cholecalciferol  (VITAMIN D3) 2000 units capsule, Take 2,000 Units by mouth daily., Disp: , Rfl:    fluticasone  (FLONASE ) 50 MCG/ACT nasal spray, Place 1 spray into both nostrils daily as needed for allergies or rhinitis., Disp: , Rfl:    ipratropium (ATROVENT ) 0.03 % nasal spray, Place 2 sprays into both nostrils 2 (two) times daily., Disp: , Rfl:    lidocaine -prilocaine  (EMLA ) cream, Apply 1 Application topically daily as needed. Apply small amount to port and cover with saran wrap 1-2 hours prior to port access, Disp: 30 g, Rfl: 3   loratadine  (CLARITIN ) 10 MG tablet, Take 10 mg by mouth daily., Disp: , Rfl:    magnesium  chloride (SLOW-MAG) 64 MG TBEC SR tablet, Take 1 tablet (64 mg total) by mouth 2 (two) times daily., Disp: 60 tablet, Rfl: 2   Multiple Vitamins-Minerals (ONE-A-DAY WOMENS 50 PLUS PO), Take 1 tablet by mouth daily. , Disp: , Rfl:    nystatin  (MYCOSTATIN /NYSTOP ) powder, Apply 1 Application topically daily., Disp: ,  Rfl:    ondansetron  (ZOFRAN ) 8 MG tablet, Take 1 tablet (8 mg total) by mouth every 8 (eight) hours as needed for nausea or vomiting., Disp: 60 tablet, Rfl: 1   potassium chloride  SA (KLOR-CON  M) 20 MEQ tablet, TAKE 1 TABLET BY MOUTH DAILY, Disp: 100 tablet, Rfl: 1   simvastatin  (ZOCOR ) 40 MG tablet, Take 1 tablet (40 mg total) by  mouth at bedtime., Disp: 100 tablet, Rfl: 3   triamterene -hydrochlorothiazide  (DYAZIDE ) 37.5-25 MG capsule, TAKE 1 CAPSULE BY MOUTH EACH  DAILY, Disp: 90 capsule, Rfl: 1   trifluridine -tipiracil  (LONSURF ) 20-8.19 MG tablet, Take 3 tablets (60 mg of trifluridine  total) by mouth 2 (two) times daily. 1hr after AM & PM meals days 1-5, 8-12. Repeat every 28d., Disp: 60 tablet, Rfl: 1   zinc  gluconate 50 MG tablet, Take 50 mg by mouth daily., Disp: , Rfl:    diphenoxylate -atropine  (LOMOTIL ) 2.5-0.025 MG tablet, Take 1 tablet by mouth 4 (four) times daily as needed for diarrhea or loose stools. (Patient not taking: Reported on 05/19/2024), Disp: 120 tablet, Rfl: 0   megestrol  (MEGACE ) 40 MG/ML suspension, Take 40 mg by mouth 2 (two) times daily. (Patient not taking: Reported on 05/19/2024), Disp: , Rfl:    tiZANidine  (ZANAFLEX ) 4 MG tablet, Take 0.5 tablets (2 mg total) by mouth every 8 (eight) hours as needed for muscle spasms. (Patient not taking: Reported on 05/15/2024), Disp: 10 tablet, Rfl: 0 No current facility-administered medications for this visit.  Facility-Administered Medications Ordered in Other Visits:    heparin  lock flush 100 UNIT/ML injection, , , ,   ------------------------------------------------------------------------- Social History   Tobacco Use   Smoking status: Former    Current packs/day: 0.00    Types: Cigarettes    Quit date: 12/02/1977    Years since quitting: 46.4   Smokeless tobacco: Former  Building services engineer status: Never Used  Substance Use Topics   Alcohol use: Never   Drug use: Never    Review of Systems Per HPI unless specifically indicated above     Objective:    BP 122/64 (BP Location: Left Arm, Patient Position: Sitting, Cuff Size: Normal)   Pulse 99   Ht 5' 4 (1.626 m)   Wt 152 lb 2 oz (69 kg)   SpO2 98%   BMI 26.11 kg/m   Wt Readings from Last 3 Encounters:  05/19/24 152 lb 2 oz (69 kg)  05/15/24 150 lb 6.4 oz (68.2 kg)  05/07/24 149 lb 8  oz (67.8 kg)    Physical Exam Vitals and nursing note reviewed.  Constitutional:      General: She is not in acute distress.    Appearance: She is well-developed. She is not diaphoretic.     Comments: Well-appearing, comfortable, cooperative  HENT:     Head: Normocephalic and atraumatic.  Eyes:     General:        Right eye: No discharge.        Left eye: No discharge.     Conjunctiva/sclera: Conjunctivae normal.  Neck:     Thyroid : No thyromegaly.     Vascular: No carotid bruit.  Cardiovascular:     Rate and Rhythm: Normal rate and regular rhythm.     Heart sounds: Normal heart sounds. No murmur heard. Pulmonary:     Effort: Pulmonary effort is normal. No respiratory distress.     Breath sounds: No wheezing or rales.  Abdominal:     Comments: Colostomy bag in place  Musculoskeletal:  General: Normal range of motion.     Cervical back: Normal range of motion and neck supple.     Right lower leg: No edema.     Left lower leg: No edema.  Lymphadenopathy:     Cervical: No cervical adenopathy.  Skin:    General: Skin is warm and dry.     Findings: No erythema or rash.  Neurological:     Mental Status: She is alert and oriented to person, place, and time.  Psychiatric:        Behavior: Behavior normal.     Comments: Well groomed, good eye contact, normal speech and thoughts       Results for orders placed or performed in visit on 05/15/24  Total Protein, Urine dipstick   Collection Time: 05/15/24  8:41 AM  Result Value Ref Range   Protein, ur NEGATIVE NEGATIVE mg/dL  CBC with Differential (Cancer Center Only)   Collection Time: 05/15/24  8:41 AM  Result Value Ref Range   WBC Count 6.7 4.0 - 10.5 K/uL   RBC 3.35 (L) 3.87 - 5.11 MIL/uL   Hemoglobin 11.2 (L) 12.0 - 15.0 g/dL   HCT 66.1 (L) 63.9 - 53.9 %   MCV 100.9 (H) 80.0 - 100.0 fL   MCH 33.4 26.0 - 34.0 pg   MCHC 33.1 30.0 - 36.0 g/dL   RDW 83.3 (H) 88.4 - 84.4 %   Platelet Count 225 150 - 400 K/uL    nRBC 0.0 0.0 - 0.2 %   Neutrophils Relative % 75 %   Neutro Abs 5.0 1.7 - 7.7 K/uL   Lymphocytes Relative 14 %   Lymphs Abs 1.0 0.7 - 4.0 K/uL   Monocytes Relative 9 %   Monocytes Absolute 0.6 0.1 - 1.0 K/uL   Eosinophils Relative 2 %   Eosinophils Absolute 0.1 0.0 - 0.5 K/uL   Basophils Relative 0 %   Basophils Absolute 0.0 0.0 - 0.1 K/uL   Immature Granulocytes 0 %   Abs Immature Granulocytes 0.03 0.00 - 0.07 K/uL  CMP (Cancer Center only)   Collection Time: 05/15/24  8:41 AM  Result Value Ref Range   Sodium 135 135 - 145 mmol/L   Potassium 3.6 3.5 - 5.1 mmol/L   Chloride 104 98 - 111 mmol/L   CO2 23 22 - 32 mmol/L   Glucose, Bld 146 (H) 70 - 99 mg/dL   BUN 17 8 - 23 mg/dL   Creatinine 9.19 9.55 - 1.00 mg/dL   Calcium  9.1 8.9 - 10.3 mg/dL   Total Protein 7.2 6.5 - 8.1 g/dL   Albumin 3.6 3.5 - 5.0 g/dL   AST 38 15 - 41 U/L   ALT 24 0 - 44 U/L   Alkaline Phosphatase 72 38 - 126 U/L   Total Bilirubin 0.6 0.0 - 1.2 mg/dL   GFR, Estimated >39 >39 mL/min   Anion gap 8 5 - 15   *Note: Due to a large number of results and/or encounters for the requested time period, some results have not been displayed. A complete set of results can be found in Results Review.      Assessment & Plan:   Problem List Items Addressed This Visit     Metastatic adenocarcinoma (HCC) - Primary   Other Visit Diagnoses       Primary osteoarthritis of left hip         Physical deconditioning          Metastatic Colorectal cancer with recent  small bowel obstruction Followed by [facility] Dr Babara Colorectal cancer with recent small bowel obstruction resolved without surgery. Risk of recurrence remains. Long course with history of prior chemotherapy and radiation therapy Now limited options given recurrence, has been discontinued on current chemotherapy - Continue with scheduled second opinion at [facility] for further treatment options. Possible surgical or rad onc as well. - Monitor for  bowel obstruction symptoms and seek immediate medical attention if she occurs.  Cancer-related fatigue and deconditioning Fatigue and deconditioning likely due to recent hospitalization and cancer treatment. Muscle stiffness and weakness possibly from inactivity and nutritional factors. - Encourage regular movement and activity to improve muscle strength and reduce stiffness. - Use muscle rubs voltaren  or biofreeze and heating pads for joint stiffness and soreness, given sedentary less activity lately - Continue nutritional supplements like Boost and Ensure.  Unintentional weight loss, now stabilized Recent unintentional weight loss associated with bowel obstruction, now stabilized with slight weight gain. - Continue nutritional supplements like Boost and Ensure to maintain weight.        No orders of the defined types were placed in this encounter.   Follow up plan: Return if symptoms worsen or fail to improve.  [name], DO [facility] 05/19/2024, 2:41 PM

## 2024-05-19 NOTE — Patient Instructions (Addendum)
 Thank you for coming to the office today.  For joint soreness and stiffness, likely due to inactivity or sedentary if not moving as much, you can get joint stiff and sore. And de conditioning less exercise tolerance.  Recommend heating pad and muscle rub to help  START anti inflammatory topical - OTC Voltaren  (generic Diclofenac ) topical 2-4 times a day as needed for pain swelling of affected joint for 1-2 weeks or longer.  Heating pad to help improve knee and leg stiffness and soreness and improvement movement if possible.  Keep on Ensure / Boost daily to help supplement.  Keep up with 2nd opinion Oncology at [facility].   Please schedule a Follow-up Appointment to: Return if symptoms worsen or fail to improve.  If you have any other questions or concerns, please feel free to call the office or send a message through MyChart. You may also schedule an earlier appointment if necessary.  Additionally, you may be receiving a survey about your experience at our office within a few days to 1 week by e-mail or mail. We value your feedback.  [name], DO [facility], [place]

## 2024-05-27 ENCOUNTER — Encounter: Attending: Physician Assistant | Admitting: Physician Assistant

## 2024-05-27 DIAGNOSIS — Z86718 Personal history of other venous thrombosis and embolism: Secondary | ICD-10-CM | POA: Insufficient documentation

## 2024-05-27 DIAGNOSIS — Z7901 Long term (current) use of anticoagulants: Secondary | ICD-10-CM | POA: Diagnosis not present

## 2024-05-27 DIAGNOSIS — C4499 Other specified malignant neoplasm of skin, unspecified: Secondary | ICD-10-CM | POA: Insufficient documentation

## 2024-05-27 DIAGNOSIS — L98492 Non-pressure chronic ulcer of skin of other sites with fat layer exposed: Secondary | ICD-10-CM | POA: Insufficient documentation

## 2024-05-27 DIAGNOSIS — C3412 Malignant neoplasm of upper lobe, left bronchus or lung: Secondary | ICD-10-CM | POA: Insufficient documentation

## 2024-05-27 DIAGNOSIS — Z85048 Personal history of other malignant neoplasm of rectum, rectosigmoid junction, and anus: Secondary | ICD-10-CM | POA: Diagnosis not present

## 2024-05-27 NOTE — Progress Notes (Signed)
 New Patient Intake Documentation Encounter  Upcoming appointments: Future Appointments     Date/Time Provider Department Center Visit Type   05/28/2024 10:00 AM (Arrive by 9:45 AM) Michiel Beaulah Lenis, MD [facility] NEW PATIENT        Jacqueline Yoder [phone] is a 78 y.o. female referred for evaluation of rectal cancer.  02/03/2019- 03/19/2019 concurrent Xeloda  and radiation. Xeloda  dose 825mg  /m2 BID - rounded to 1650mg  BID- on days of radiation. 04/09/2019, started on FOLFOX with bolus early. Omitted.  07/16/2019 finished 8 cycles of FOLFOX. 09/17/19 APR/posterior vaginectomy/TAH/BSO/VY-flap - pT4b pN0 with close vaginal margin 0.2 mm. Uterus and ovaries negative for malignancy.  06/30/2020 vaginal introitus mass biopsied. Pathology is consistent with metastatic colorectal adenocarcinoma  07/20/2020 - 09/13/2020 Concurrent chemotherapy with Xeloda  with Radiation.  08/02/2020-08/16/2020, . Xeloda  was held due to neutropenia  08/17/2020 - 09/06/2020 Xeloda  1500 mg twice daily concurrently with radiation  01/31/2021 - 08/20/2021 FOLFIRI + Bev; Irinotecan  dose was reduced, eventually 100mg /m2; Irinotecan  was dropped in November 2022 due to side effects. Negative for UGT1A1*28  09/01/21 - CT CAP - 1. Multiple small bilateral pulmonary nodules are not significantly changed, consistent with stable metastatic disease. 2. No noncontrast CT evidence of new metastatic disease in the  chest, abdomen or pelvis.  09/13/2021 - maintenance 5-FU/bevacizumab   11/01/21 - CT CAP - 1. Most of the small pulmonary nodules are stable compared to the prior exam, including the index 6 by 4 by 6 mm right middle lobe nodule. A 3 by 3 mm left upper lobe nodule previously measured 2 by  3 mm and may merit attention on surveillance imaging, although this slight difference in size measurement may be due to slice selection. 2. Stable appearance mild to moderate intrahepatic biliary dilatation and mild  extrahepatic biliary dilatation along with moderate dorsal pancreatic duct dilatation. Some of the biliary  prominence may be attributable to prior cholecystectomy. 3. Prior APR with left colostomy. Peristomal hernia contains omental adipose tissue but no bowel.  11/28/2021 - 5-FU/Irinotecan /bevacizumab . Irinotecan  100 mg/m2 was added back due to progressively increasing CEA.  01/20/22 - CT CAP - 1. Stable scattered small pulmonary nodules. 2. The biliary dilatation shown on the prior exam is mildly improved.  04/26/22 - CT CAP - Slight interval increase in size of a few of the scattered pulmonary nodules. Other nodules are stable. 2. Similar biliary ductal dilatation.  07/14/2022 Bone scan 1. No evidence of skeletal metastatic disease in the hips.2. Asymmetric increased activity in the RIGHT humeral head is favored degenerative as described above. If pain in the RIGHT shoulder consider further evaluation with plain film or MRI 07/17/2022 CT CAP - 1. Stable scattered small bilateral pulmonary nodules, largest 8 by 6 mm in the right middle lobe. 2. Subtle chronically stable posterior cortical irregularity and heterogeneity in the left iliac bone, not appreciable on the bone scan of 07/14/2022. This has been present at least over the past year and could reflect radiation therapy related findings although strictly speaking, malignant involvement of the left iliac bone is difficult to exclude. If clinically warranted this could be further assessed with nuclear medicine PET-CT or MRI of the bony pelvis with and without contrast. 3. Pelvic floor laxity with loops of small bowel extending 3 cm below the pubococcygeal line. 4. Aortic and systemic atherosclerosis as detailed above.Aortic Atherosclerosis  12/26/2022 CT chest abdomen pelvis w contrast 1. Stable scattered solid pulmonary nodules. 2. Right inguinal and right pelvic lymph nodes which  were hypermetabolic on prior PET-CT are stable. 3. Osseous lesion of the  proximal right humerus demonstrates increased sclerosis when compared with the prior PET-CT, possibly due to treatment response. 4. 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 5. Aortic Atherosclerosis  06/25/2023 CT chest abdomen pelvis w contrast showed 1. Increased size of the solid bilateral pulmonary nodules,consistent with worsening metastatic disease. 2. Increased size of the right external iliac and inguinal lymph nodes, concerning for worsening metastatic disease.3. No significant interval change in the left iliac bone osseous metastasis.4. Enlarged main pulmonary artery measuring 3.6 cm, which can be seen in the setting of pulmonary arterial hypertension. 07/11/2023 PET showed 1. Multifocal tracer avid abdominopelvic lymph nodes are identified compatible with metastatic disease. 2. Multifocal bilateral pulmonary nodules are identified compatible with pulmonary metastasis.  3. Diffuse increased radiotracer uptake throughout the marrow space is identified diminishing sensitivity for detecting osseous metastasis. No abnormal increased radiotracer uptake identified above background increased bone marrow activity to indicate metabolically active bone metastases.4. Aortic Atherosclerosis 07/23/2023 - 11/13/2023 switch to dose reduced FOLFOX  09/16/2023 CT abdomen pelvis w contrast 1. Small-bowel obstruction with transition point in the anterior aspect of the lower abdomen, left of midline. This may be secondary to adhesions. 2. Small volume ascites. 3. Prior colon resection with left anterior abdominal wall colostomy. Adjacent parastomal hernia. Small volume fluid within the hernia sac. 4. Stable pulmonary metastatic disease. 5. Stable metastatic lesion of the left iliac wing. 6. Enlarged right external iliac chain lymph node measuring 1.4 cm, previously 1.3 cm. Several mildly prominent retroperitoneal lymph nodes have increased in size from prior but remain subcentimeter in size.7. Aortic  atherosclerosis (ICD10-I70.0). 09/16/2023 - 09/20/2023 Hospitalized due to Small bowel obstruction due to adhesions  09/24/2023 CT chest - Bilateral pulmonary metastases are all stable from 06/25/2023 chest CT. 2. No thoracic adenopathy. No new or progressive metastatic disease in the chest. 3. Trace bilateral posterior pleural effusions. 4. Two-vessel coronary atherosclerosis. 5. Dilated main pulmonary artery, suggesting pulmonary arterial hypertension. 11/28/2023 CT chest abdomen pelvis with contrast 1. Increased size of retroperitoneal, iliac side chain and inguinal lymph nodes, concerning for progressive nodal metastatic disease. 2. Numerous bilateral solid pulmonary nodules are again seen, some of which have increased in size and others are stable in size overall compatible with disease progression. 3. Increased size of the subcapsular lesion in the anterior inferior right lobe of the liver, which has been stable over multiple prior examinations and was not FDG avid on prior PET-CT. Suggest attention on follow-up imaging. 4. 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 5. Questionable wall thickening of matted loops of small bowel in the left lower quadrant, suggest attention on follow-up imaging. 6. Small volume abdominopelvic free fluid. 7. Aortic Atherosclerosis (ICD10-I70.0). 12/13/2023 - COLORECTAL Bevacizumab  + Trifluridine /Tipiracil  q28d  01/01/2024 Right inguinal crease skin nodule biopsy Pathology showed dermal deposits of metastatic colon adenocarcinoma, extending to the edge and base.  01/14/2024 PET scan showed 1. Progressive disease with progressive left supraclavicular adenopathy, progressive pulmonary metastatic disease, progressive abdominal and pelvic adenopathy, and progressive multifocal skeletal involvement. 2. Substantial multifocal skeletal involvement including the right proximal humerus, right medial clavicle, left T11 vertebral body, L4  posterior elements, and entire left iliac crest. The permeative lesion of the right humerus may have some extraosseous extension. Similarly there is cortical breakthrough in the large left iliac crest lesion. 3. Hypermetabolic irregular tumor extends along the right inguinal ring region and right hip adductor musculature  as well as in the subcutaneous and cutaneous tissues along the right inguinal fold, compatible with tumor involvement. 4. Right eccentric vulvar activity just below the clitoris has a maximum SUV of 7.5, and given the regional soft tissue metastatic disease, a metastatic focus in this vicinity is not excluded. 5. The subcapsular lesion along the anterior inferior right hepatic lobe described on CT of 11/28/2023 is not discernibly hypermetabolic or visible on today's PET-CT.  01/28/24 - 02/01/24 - radiation to right humerus  04/10/2024 CT chest abdomen pelvis w contrast showed 1. Status post abdominoperineal resection with left lower quadrant end colostomy. 2. New, eccentric, nodular wall thickening of the colon at the hepatic flexure measuring 2.3 x 1.4 cm. This may be transient and peristaltic however a metastatic peritoneal implant or metachronous colon malignancy not excluded. Attention on follow-up.3. Otherwise no significant change in previously FDG avid widespread pulmonary, nodal, and osseous metastatic disease, as well as soft tissue involvement of the right lower quadrant abdominal wall and pannus. 4. Coronary artery disease. 05/04/24 - CT AP - 1. Findings consistent with a distal partial small bowel obstruction. 2. Evidence of prior abdominoperineal resection with a left lower quadrant ostomy site. 3. 5.0 cm x 6.3 cm parastomal hernia which contains fat and a segment of large bowel. 4. Colonic diverticulosis. 5. Multiple subcentimeter pulmonary nodules within the left upper lobe right middle lobe and bilateral lower lobes, as described above. 6. Stable 15 mm x 14 mm right middle lobe  pulmonary nodule. 7. Hepatic steatosis. 8. Evidence of prior cholecystectomy and hysterectomy. 9. Aortic atherosclerosis.    Patient Active Problem List  Diagnosis  . Hyperlipidemia  . Vitamin D  deficiency  . Benign hypertension with chronic kidney disease, stage III (CMS/HHS-HCC)  . Allergic rhinitis  . Dyspepsia  . H/O cervical polypectomy  . Health care maintenance  . Elevated fasting glucose  . Colostomy in place (CMS/HHS-HCC)  . Pannus, abdominal  . Rectal cancer (CMS/HHS-HCC)  . Rectovaginal fistula  . Metastatic adenocarcinoma (CMS/HHS-HCC)  . Pulmonary emboli (CMS/HHS-HCC)  . Aortic atherosclerosis ()    Molecular/Genetic:  Miscellaneous  Tempus liquid biopsy showed no actionable variants.  NOTCH1 missense variant, BRCA2 missense variant , MYC missense variant    Pathology:  Surgical pathology Specimen: Tissue - Vaginal biopsy (procedure) Component 3 yr ago  SURGICAL PATHOLOGY SURGICAL PATHOLOGY CASE: [id number] PATIENT: Jacqueline Yoder Surgical Pathology Report     Specimen Submitted: A. Vaginal introitus; biopsy  Clinical History: History of rectal cancer      DIAGNOSIS: A. VAGINAL INTROITUS; BIOPSY: - INVOLVEMENT BY COLORECTAL ADENOCARCINOMA, SEE COMMENT.  Comment: Sections demonstrate moderately differentiated adenocarcinoma focally undermining intact squamous mucosa.  Given the patient's history a limited panel of immunohistochemical stains was performed.  The carcinoma is positive for CK20 and CDX2 and is negative for CK7.  These findings are consistent with involvement by the patient's known colorectal adenocarcinoma.  IHC slides were prepared by [facility], [place], [place]. All controls stained appropriately.  This test was developed and its performance characteristics determined by [facility]. It has not been cleared or approved by the [place]  Food and Drug Administration. The FDA does not require this test to  go through premarket FDA review. This test is used for clinical purposes. It should not be regarded as investigational or for research. This laboratory is certified under the Clinical Laboratory Improvement Amendments (CLIA) as qualified to perform high complexity clinical laboratory testing.   Labs:  No results found for: CEA  Hemoglobin  Date Value Ref Range Status  09/20/2019 9.1 (L) 12.0 - 15.5 g/dL Final  91/83/7983 85.8 12.0 - 15.0 gm/dL Final   Hematocrit  Date Value Ref Range Status  09/20/2019 27.8 (L) 35.0 - 45.0 % Final  06/01/2015 44.7 35.0 - 47.0 % Final   RBC (Red Blood Cell Count)  Date Value Ref Range Status  09/20/2019 2.92 (L) 3.77 - 5.16 x10^12/L Final  06/01/2015 4.87 4.04 - 5.48 10^6/uL Final   WBC (White Blood Cell Count)  Date Value Ref Range Status  09/20/2019 7.6 3.2 - 9.8 x10^9/L Final  06/01/2015 4.5 4.1 - 10.2 10^3/uL Final   Platelets  Date Value Ref Range Status  09/20/2019 141 (L) 150 - 450 x10^9/L Final   Platelet Count  Date Value Ref Range Status  06/01/2015 258 150 - 450 10^3/uL Final   Urea Nitrogen (BUN)  Date Value Ref Range Status  04/14/2020 10 7 - 25 mg/dL Final  87/94/7979 13 7 - 20 mg/dL Final   Creatinine  Date Value Ref Range Status  04/14/2020 0.9 0.6 - 1.1 mg/dL Final  87/94/7979 0.9 0.4 - 1.0 mg/dL Final     Imaging:  X-ray fluoro less than 1 hour This order has been auto-finalized. Please see additional clinical  documentation for result report.    Records:   Clinic notes: [facility], [facility] Pathology reports: [facility], [facility] Pathology slides: complete Molecular/genetic testing: requested Endoscopy reports: [facility] Operative reports: [facility] Images: [facility] Imaging reports: [facility], [facility]

## 2024-05-28 DIAGNOSIS — C799 Secondary malignant neoplasm of unspecified site: Secondary | ICD-10-CM | POA: Diagnosis not present

## 2024-05-28 DIAGNOSIS — L98492 Non-pressure chronic ulcer of skin of other sites with fat layer exposed: Secondary | ICD-10-CM | POA: Diagnosis not present

## 2024-05-28 DIAGNOSIS — Z7901 Long term (current) use of anticoagulants: Secondary | ICD-10-CM | POA: Diagnosis not present

## 2024-05-28 DIAGNOSIS — C20 Malignant neoplasm of rectum: Secondary | ICD-10-CM | POA: Diagnosis not present

## 2024-05-29 ENCOUNTER — Inpatient Hospital Stay

## 2024-05-29 ENCOUNTER — Ambulatory Visit

## 2024-05-29 ENCOUNTER — Other Ambulatory Visit

## 2024-05-29 ENCOUNTER — Ambulatory Visit: Admitting: Oncology

## 2024-06-04 ENCOUNTER — Ambulatory Visit (INDEPENDENT_AMBULATORY_CARE_PROVIDER_SITE_OTHER): Admitting: Surgery

## 2024-06-04 ENCOUNTER — Encounter: Payer: Self-pay | Admitting: Surgery

## 2024-06-04 VITALS — BP 146/79 | HR 116 | Temp 98.7°F | Ht 64.0 in | Wt 155.4 lb

## 2024-06-04 DIAGNOSIS — C20 Malignant neoplasm of rectum: Secondary | ICD-10-CM | POA: Diagnosis not present

## 2024-06-04 DIAGNOSIS — Z452 Encounter for adjustment and management of vascular access device: Secondary | ICD-10-CM | POA: Diagnosis not present

## 2024-06-04 MED ORDER — ALPRAZOLAM 0.5 MG PO TABS
0.5000 mg | ORAL_TABLET | Freq: Once | ORAL | 0 refills | Status: AC
Start: 2024-06-04 — End: 2024-06-04

## 2024-06-04 MED ORDER — TRAMADOL HCL 50 MG PO TABS: 50.0000 mg | ORAL_TABLET | Freq: Once | ORAL | 0 refills | Status: AC

## 2024-06-04 NOTE — Progress Notes (Signed)
 Outpatient Surgical Follow Up  06/04/2024  Jacqueline Yoder is an 78 y.o. female.   Chief Complaint  Patient presents with   Follow-up    Discuss port removal    HPI: Jacqueline Yoder is a 78y.o.very nice 54 female with history of advanced colon cancer status post chemotherapy and radiation followed by abdominoperineal resection 5 years ago or so.  She unfortunately had recurrence with multiple metastatic disease including lung metastases bone metastases as well as soft tissue metastasis in her skin pannus.  She endorses weakness.  She states that her ostomy is working  She did have bilateral recent  SBO that was managed conservatively  She did have CT of the chest abdomen and pelvis  that I have pers reviewed  showing increase in lymphadenopathy within the retroperitoneal area consistent with metastatic disease she also had multiple pulmonary metastases nodules.  She does have multiple liver lesions consistent with metastatic disease.  She does seems to have also MAtted loops of small bowel in the left lower quadrant and I question metastatic disease . She does have dilation of the Right colon, ? Potential extension of dz into the large bowel. Clinically she is tolerating diet, , nml BM. She has seen oncology at [facility] and no many options. Not candidate for clinical trial or further chemo. She is not ready for hospice . She wishes to have port removed       Past Medical History:  Diagnosis Date   Allergy    Arthritis    Blood clot in vein    Family history of colon cancer    GERD (gastroesophageal reflux disease)    Hypercholesteremia    Hypertension    Hypertension    Lower extremity edema    Personal history of chemotherapy    Rectal cancer (HCC) 12/2018   Urinary incontinence     Past Surgical History:  Procedure Laterality Date   ABDOMINAL HYSTERECTOMY     CHOLECYSTECTOMY  1971   COLONOSCOPY WITH PROPOFOL  N/A 12/03/2018   Procedure: COLONOSCOPY WITH PROPOFOL ;  Surgeon: Jinny Carmine, MD;  Location: [facility] ENDOSCOPY;  Service: Endoscopy;  Laterality: N/A;   COLONOSCOPY WITH PROPOFOL  N/A 07/15/2020   Procedure: COLONOSCOPY WITH PROPOFOL ;  Surgeon: Therisa Bi, MD;  Location: [facility] ENDOSCOPY;  Service: Gastroenterology;  Laterality: N/A;   FLEXIBLE SIGMOIDOSCOPY N/A 12/06/2018   Procedure: FLEXIBLE SIGMOIDOSCOPY;  Surgeon: Therisa Bi, MD;  Location: [facility] ENDOSCOPY;  Service: Endoscopy;  Laterality: N/A;   LAPAROSCOPIC COLOSTOMY  01/06/2019   PORTACATH PLACEMENT N/A 04/03/2019   Procedure: INSERTION PORT-A-CATH;  Surgeon: Jordis Laneta JULIANNA, MD;  Location: [facility] ORS;  Service: General;  Laterality: N/A;    Family History  Problem Relation Age of Onset   Colon cancer Brother 47       exposure to chemicals [place]   Hypertension Mother    Stroke Mother    Kidney failure Father    Breast cancer Neg Hx    Ovarian cancer Neg Hx     Social History:  reports that she quit smoking about 46 years ago. Her smoking use included cigarettes. She has quit using smokeless tobacco. She reports that she does not drink alcohol and does not use drugs.  Allergies:  Allergies  Allergen Reactions   Sulfamethoxazole-Trimethoprim Other (See Comments)    Also said she had chills and pressure in nose.    Medications reviewed.    ROS Full ROS performed and is otherwise negative other than what is stated in HPI  BP (!) 146/79   Pulse (!) 116   Temp 98.7 F (37.1 C) (Oral)   Ht 5' 4 (1.626 m)   Wt 155 lb 6.4 oz (70.5 kg)   SpO2 96%   BMI 26.67 kg/m   Physical Exam CONSTITUTIONAL: Chronically ill and debilitated.  She is in no acute distress. EYES: Pupils are equal, round, , Sclera are non-icteric. EARS, NOSE, MOUTH AND THROAT: She is wearing a mask,  Hearing is intact to voice. LYMPH NODES:  Lymph nodes in the neck are normal. RESPIRATORY:  Lungs are clear. There is normal respiratory effort, with equal breath sounds bilaterally, and without pathologic use of accessory  muscles. Right chest wall port w/o infection CARDIOVASCULAR: Heart is regular without murmurs, gallops, or rubs. GI: The abdomen is  soft, nontender, and nondistended. There are no palpable masses. There is no hepatosplenomegaly. There is a colostomy w fecal material, no evidence of complications. GU: Rectal deferred.   MUSCULOSKELETAL:decrease  muscle strength . No cyanosis or edema.  She is wearing asling due to her UE fracture SKIN:  She has a right lower quadrant pannus that has soft tissue involvement by metastatic disease   NEUROLOGIC: Motor and sensation is grossly normal. Cranial nerves are grossly intact. PSYCH:  Oriented to person, place and time. Affect is normal.   Assessment/Plan: Metastatic colorectal CA She wishes to have port removed. Pt initially wished to have cath removed in the OR. I d/w her that another alternative is to give her tramadol  and xanax  before procedure here in the office. She is in agreement and much more comfortable with this approach and she wishes to avoid IV and needles in general. Procedure d/w pt and daughter in detail, risks, benefits nad possible complications. We will perform this next week here in the officce I personally spent a total of 30 minutes in the care of the patient today including performing a medically appropriate exam/evaluation, counseling and educating, placing orders, referring and communicating with other health care professionals, documenting clinical information in the EHR, independently interpreting and reviewing images studies and coordinating care.   Laneta Luna, MD [address] [facility] General Surgeon

## 2024-06-04 NOTE — Patient Instructions (Signed)
 Please take 1 Xanax  and 1 Tramadol  before coming to your appointment.   Implanted Port Removal  Implanted port removal is a procedure to remove the port and catheter that are implanted under your skin. The port is a small disc under your skin that can be punctured with a needle. It is connected to a vein in your chest or neck by a small, thin tube (catheter). The implanted port is used to give medicines for treatments, and it may also be used to take blood samples. Your health care provider will remove the implanted port if: You no longer need it for treatment. It is not working properly. The area around it gets infected. Tell a health care provider about: Any allergies you have. All medicines you are taking, including vitamins, herbs, eye drops, creams, and over-the-counter medicines. Any problems you or family members have had with anesthetic medicines. Any bleeding problems you have. Any surgeries you have had. Any medical conditions you have. Whether you are pregnant or may be pregnant. What are the risks? Generally, this is a safe procedure. However, problems may occur, including: Infection. Bleeding. Allergic reactions to anesthetic medicines. Damage to nerves or blood vessels. What happens before the procedure? Medicines Ask your health care provider about: Changing or stopping your regular medicines. This is especially important if you are taking diabetes medicines or blood thinners. Taking medicines such as aspirin and ibuprofen. These medicines can thin your blood. Do not take these medicines unless your health care provider tells you to take them. Taking over-the-counter medicines, vitamins, herbs, and supplements. Tests You will have: A physical exam. Blood tests. Imaging tests, including a chest X-ray. General instructions Follow instructions from your health care provider about eating or drinking restrictions. Ask your health care provider: How your surgery site  will be marked. What steps will be taken to help prevent infection. These steps may include: Removing hair at the surgery site. Washing skin with a germ-killing soap. Taking antibiotic medicine. If you will be going home right after the procedure, plan to have a responsible adult: Take you home from the hospital or clinic. You will not be allowed to drive. Care for you for the time you are told. What happens during the procedure? You may be given one or more of the following: A medicine to help you relax (sedative). A medicine to numb the area (local anesthetic). A small incision will be made at the site of your implanted port. The implanted port and the catheter that has been inside your vein will be gently removed. The port and catheter will be inspected to make sure that all the parts have been removed. Part of the catheter may be tested for bacteria. The incision will be closed with stitches (sutures), adhesive strips, or skin glue. A bandage (dressing) will be placed over the incision. The health care provider may apply gentle pressure over the dressing for about 5 minutes. The procedure may vary among health care providers and hospitals. What happens after the procedure? Your blood pressure, heart rate, breathing rate, and blood oxygen level will be monitored until you leave the hospital or clinic. You will be monitored to make sure that there is no bleeding from the site where the port was removed. If you were given a sedative during the procedure, it can affect you for several hours. Do not drive or operate machinery until your health care provider says that it is safe. Summary Implanted port removal is a procedure to remove the port  and catheter that are implanted under your skin. Before the procedure, follow your health care provider's instructions about changing or stopping your regular medicines. This is especially important if you are taking diabetes medicines or blood  thinners. If you will be going home right after the procedure, plan to have a responsible adult care for you for the time you are told. This information is not intended to replace advice given to you by your health care provider. Make sure you discuss any questions you have with your health care provider. Document Revised: 04/05/2021 Document Reviewed: 04/05/2021 Elsevier Patient Education  2024 [employer].

## 2024-06-05 ENCOUNTER — Telehealth: Payer: Self-pay

## 2024-06-05 DIAGNOSIS — Z7901 Long term (current) use of anticoagulants: Secondary | ICD-10-CM | POA: Diagnosis not present

## 2024-06-05 DIAGNOSIS — L98492 Non-pressure chronic ulcer of skin of other sites with fat layer exposed: Secondary | ICD-10-CM | POA: Diagnosis not present

## 2024-06-05 NOTE — Telephone Encounter (Signed)
 Patient's daughter called asking if the patient needed to be off of their Eliquis  for her port removal. Per Dr Jordis will need to stop this 48 hours prior to her scheduled port removal. Patient is aware.

## 2024-06-09 ENCOUNTER — Ambulatory Visit: Admitting: Surgery

## 2024-06-10 ENCOUNTER — Encounter: Admitting: Internal Medicine

## 2024-06-10 DIAGNOSIS — Z86718 Personal history of other venous thrombosis and embolism: Secondary | ICD-10-CM | POA: Diagnosis not present

## 2024-06-10 DIAGNOSIS — C3412 Malignant neoplasm of upper lobe, left bronchus or lung: Secondary | ICD-10-CM | POA: Diagnosis not present

## 2024-06-10 DIAGNOSIS — C4499 Other specified malignant neoplasm of skin, unspecified: Secondary | ICD-10-CM | POA: Diagnosis not present

## 2024-06-10 DIAGNOSIS — Z85048 Personal history of other malignant neoplasm of rectum, rectosigmoid junction, and anus: Secondary | ICD-10-CM | POA: Diagnosis not present

## 2024-06-10 DIAGNOSIS — Z7901 Long term (current) use of anticoagulants: Secondary | ICD-10-CM | POA: Diagnosis not present

## 2024-06-10 DIAGNOSIS — L98492 Non-pressure chronic ulcer of skin of other sites with fat layer exposed: Secondary | ICD-10-CM | POA: Diagnosis not present

## 2024-06-18 ENCOUNTER — Ambulatory Visit (INDEPENDENT_AMBULATORY_CARE_PROVIDER_SITE_OTHER): Admitting: Surgery

## 2024-06-18 ENCOUNTER — Encounter: Payer: Self-pay | Admitting: Surgery

## 2024-06-18 VITALS — BP 126/80 | HR 118 | Temp 98.3°F | Ht 64.0 in | Wt 155.8 lb

## 2024-06-18 DIAGNOSIS — C20 Malignant neoplasm of rectum: Secondary | ICD-10-CM

## 2024-06-18 DIAGNOSIS — Z452 Encounter for adjustment and management of vascular access device: Secondary | ICD-10-CM

## 2024-06-18 DIAGNOSIS — Z7901 Long term (current) use of anticoagulants: Secondary | ICD-10-CM | POA: Diagnosis not present

## 2024-06-18 NOTE — Patient Instructions (Signed)
Implanted Port Removal, Care After The following information offers guidance on how to care for yourself after your procedure. Your health care provider may also give you more specific instructions. If you have problems or questions, contact your health care provider. What can I expect after the procedure? After the procedure, it is common to have: Soreness or pain near your incision. Some swelling or bruising near your incision. Follow these instructions at home: Medicines Take over-the-counter and prescription medicines only as told by your health care provider. If you were prescribed an antibiotic medicine, take it as told by your health care provider. Do not stop taking the antibiotic even if you start to feel better. Bathing Do not take baths, swim, or use a hot tub until your health care provider approves. Ask your health care provider if you can take showers. You may only be allowed to take sponge baths. Incision care  Follow instructions from your health care provider about how to take care of your incision. Make sure you: Wash your hands with soap and water for at least 20 seconds before and after you change your bandage (dressing). If soap and water are not available, use hand sanitizer. Change your dressing as told by your health care provider. Keep your dressing dry. Leave stitches (sutures), skin glue, or adhesive strips in place. These skin closures may need to stay in place for 2 weeks or longer. If adhesive strip edges start to loosen and curl up, you may trim the loose edges. Do not remove adhesive strips completely unless your health care provider tells you to do that. Check your incision area every day for signs of infection. Check for: More redness, swelling, or pain. More fluid or blood. Warmth. Pus or a bad smell. Activity Return to your normal activities as told by your health care provider. Ask your health care provider what activities are safe for you. You may have  to avoid lifting. Ask your health care provider how much you can safely lift. Do not do activities that involve lifting your arms over your head. Driving  If you were given a sedative during the procedure, it can affect you for several hours. Do not drive or operate machinery until your health care provider says that it is safe. If you did not receive a sedative, ask your health care provider when it is safe to drive. General instructions Do not use any products that contain nicotine or tobacco. These products include cigarettes, chewing tobacco, and vaping devices, such as e-cigarettes. These can delay healing after surgery. If you need help quitting, ask your health care provider. Keep all follow-up visits. This is important. Contact a health care provider if: You have a fever or chills. You have more redness, swelling, or pain around your incision. You have more fluid or blood coming from your incision. Your incision feels warm to the touch. You have pus or a bad smell coming from your incision. You have pain that is not relieved by your pain medicine. Get help right away if: You have chest pain. You have difficulty breathing. These symptoms may be an emergency. Get help right away. Call 911. Do not wait to see if the symptoms will go away. Do not drive yourself to the hospital. Summary After the procedure, it is common to have pain, soreness, swelling, or bruising near your incision. If you were prescribed an antibiotic medicine, take it as told by your health care provider. Do not stop taking the antibiotic even if you   start to feel better. If you were given a sedative during the procedure, it can affect you for several hours. Do not drive or operate machinery until your health care provider says that it is safe. Return to your normal activities as told by your health care provider. Ask your health care provider what activities are safe for you. This information is not intended to  replace advice given to you by your health care provider. Make sure you discuss any questions you have with your health care provider. Document Revised: 04/05/2021 Document Reviewed: 04/05/2021 Elsevier Patient Education  2024 Elsevier Inc.  

## 2024-06-19 NOTE — Progress Notes (Signed)
 PROCEDURE NOTE 1. Excision of Right IJ port  ANESTHESIA: Lidocaine  1% w epi 9cc  EBL: minimal  Complications: none  After informed consent was obtained.  We placed the patient in the supine position and prepped and draped in the usual sterile fashion.  An infraclavicular incision was created where the previous port was placed and we excised the subcutaneous tissue.  We identified the port on incises capsule.  We remove it from adjacent structures and cut the Prolene sutures.  We remove the port after asking the patient for a Valsalva and obtain hemostasis with pressure.  The wound was closed in a 2 layer fashion with 3-0 Vicryl and 4 Monocryl for the skin.  Dermabond was used to coat the skin.  There were no complications. Pt is very emotional. Positive empathetic reinforcement provided. Pt very appreciative

## 2024-06-23 ENCOUNTER — Telehealth: Payer: Self-pay

## 2024-06-23 ENCOUNTER — Telehealth (INDEPENDENT_AMBULATORY_CARE_PROVIDER_SITE_OTHER): Payer: Self-pay

## 2024-06-23 DIAGNOSIS — E559 Vitamin D deficiency, unspecified: Secondary | ICD-10-CM

## 2024-06-23 DIAGNOSIS — E538 Deficiency of other specified B group vitamins: Secondary | ICD-10-CM

## 2024-06-23 DIAGNOSIS — E782 Mixed hyperlipidemia: Secondary | ICD-10-CM

## 2024-06-23 DIAGNOSIS — I129 Hypertensive chronic kidney disease with stage 1 through stage 4 chronic kidney disease, or unspecified chronic kidney disease: Secondary | ICD-10-CM

## 2024-06-23 DIAGNOSIS — R7309 Other abnormal glucose: Secondary | ICD-10-CM

## 2024-06-23 DIAGNOSIS — Z0000 Encounter for general adult medical examination without abnormal findings: Secondary | ICD-10-CM

## 2024-06-23 NOTE — Telephone Encounter (Signed)
 She should come in with dvt study

## 2024-06-23 NOTE — Telephone Encounter (Signed)
 Patient daughter left a message stating that her mother for about 1-2 weeks right leg from the toes up towards the thigh is swollen and tight. The leg is not warm to touch or red. Patient has been wearing compression socks and using compression sleeve. Last Sunday her mother had to stop Eliquis  for port removal but she restarted on Thursday, but the swelling had previously started with right leg.patient has appt 07/16/24 but the daughter is requesting if she can be seen sooner date. Please advise

## 2024-06-23 NOTE — Addendum Note (Signed)
 Addended by: EDMAN MARSA PARAS on: 06/23/2024 10:57 AM   Modules accepted: Orders

## 2024-06-23 NOTE — Telephone Encounter (Signed)
 I have placed orders for Vitamin D  and B12, and updated her other lab orders. Her lab apt is currently scheduled for 9/24 before her annual with me in Oct 2025.  Let me know if need anything else  [name], DO [facility] 06/23/2024, 10:57 AM

## 2024-06-23 NOTE — Telephone Encounter (Signed)
 Copied from CRM [id number]. Topic: Clinical - Request for Lab/Test Order >> Jun 23, 2024  9:40 AM Maisie BROCKS wrote: Reason for CRM: pt's daughter Rutherford, called to request for pcp to order b12 and vitamin D  to pt's lab orders. Please call back at [phone] to schedule when ordered.

## 2024-06-23 NOTE — Telephone Encounter (Signed)
 Jacqueline Yoder notified the labs requested have been ordered. Rescheduled lab appointment to better fit their schedule.

## 2024-06-24 ENCOUNTER — Encounter: Attending: Physician Assistant | Admitting: Physician Assistant

## 2024-06-24 ENCOUNTER — Other Ambulatory Visit (INDEPENDENT_AMBULATORY_CARE_PROVIDER_SITE_OTHER): Payer: Self-pay | Admitting: Nurse Practitioner

## 2024-06-24 DIAGNOSIS — C4499 Other specified malignant neoplasm of skin, unspecified: Secondary | ICD-10-CM | POA: Insufficient documentation

## 2024-06-24 DIAGNOSIS — Z85048 Personal history of other malignant neoplasm of rectum, rectosigmoid junction, and anus: Secondary | ICD-10-CM | POA: Diagnosis not present

## 2024-06-24 DIAGNOSIS — L98492 Non-pressure chronic ulcer of skin of other sites with fat layer exposed: Secondary | ICD-10-CM | POA: Insufficient documentation

## 2024-06-24 DIAGNOSIS — Z7901 Long term (current) use of anticoagulants: Secondary | ICD-10-CM | POA: Diagnosis not present

## 2024-06-24 DIAGNOSIS — Z86711 Personal history of pulmonary embolism: Secondary | ICD-10-CM | POA: Diagnosis not present

## 2024-06-24 DIAGNOSIS — C3412 Malignant neoplasm of upper lobe, left bronchus or lung: Secondary | ICD-10-CM | POA: Diagnosis not present

## 2024-06-24 DIAGNOSIS — M7989 Other specified soft tissue disorders: Secondary | ICD-10-CM

## 2024-06-25 ENCOUNTER — Ambulatory Visit (INDEPENDENT_AMBULATORY_CARE_PROVIDER_SITE_OTHER)

## 2024-06-25 DIAGNOSIS — L98492 Non-pressure chronic ulcer of skin of other sites with fat layer exposed: Secondary | ICD-10-CM | POA: Diagnosis not present

## 2024-06-25 DIAGNOSIS — M7989 Other specified soft tissue disorders: Secondary | ICD-10-CM

## 2024-06-25 DIAGNOSIS — Z7901 Long term (current) use of anticoagulants: Secondary | ICD-10-CM | POA: Diagnosis not present

## 2024-06-26 ENCOUNTER — Ambulatory Visit (INDEPENDENT_AMBULATORY_CARE_PROVIDER_SITE_OTHER): Admitting: Nurse Practitioner

## 2024-06-26 ENCOUNTER — Other Ambulatory Visit

## 2024-06-26 ENCOUNTER — Encounter (INDEPENDENT_AMBULATORY_CARE_PROVIDER_SITE_OTHER): Payer: Self-pay | Admitting: Nurse Practitioner

## 2024-06-26 VITALS — BP 126/76 | HR 111 | Ht 64.0 in | Wt 156.0 lb

## 2024-06-26 DIAGNOSIS — E538 Deficiency of other specified B group vitamins: Secondary | ICD-10-CM

## 2024-06-26 DIAGNOSIS — E559 Vitamin D deficiency, unspecified: Secondary | ICD-10-CM

## 2024-06-26 DIAGNOSIS — R7309 Other abnormal glucose: Secondary | ICD-10-CM

## 2024-06-26 DIAGNOSIS — I129 Hypertensive chronic kidney disease with stage 1 through stage 4 chronic kidney disease, or unspecified chronic kidney disease: Secondary | ICD-10-CM | POA: Diagnosis not present

## 2024-06-26 DIAGNOSIS — E782 Mixed hyperlipidemia: Secondary | ICD-10-CM | POA: Diagnosis not present

## 2024-06-26 DIAGNOSIS — I890 Lymphedema, not elsewhere classified: Secondary | ICD-10-CM

## 2024-06-26 DIAGNOSIS — N182 Chronic kidney disease, stage 2 (mild): Secondary | ICD-10-CM | POA: Diagnosis not present

## 2024-06-26 DIAGNOSIS — Z0000 Encounter for general adult medical examination without abnormal findings: Secondary | ICD-10-CM

## 2024-06-27 DIAGNOSIS — C20 Malignant neoplasm of rectum: Secondary | ICD-10-CM | POA: Diagnosis not present

## 2024-06-27 DIAGNOSIS — C799 Secondary malignant neoplasm of unspecified site: Secondary | ICD-10-CM | POA: Diagnosis not present

## 2024-06-27 DIAGNOSIS — R5381 Other malaise: Secondary | ICD-10-CM | POA: Diagnosis not present

## 2024-06-27 DIAGNOSIS — Z515 Encounter for palliative care: Secondary | ICD-10-CM | POA: Diagnosis not present

## 2024-06-27 DIAGNOSIS — Z933 Colostomy status: Secondary | ICD-10-CM | POA: Diagnosis not present

## 2024-06-27 LAB — HEMOGLOBIN A1C
Hgb A1c MFr Bld: 5 % (ref <5.7)
Mean Plasma Glucose: 97 mg/dL
eAG (mmol/L): 5.4 mmol/L

## 2024-06-27 LAB — LIPID PANEL
Cholesterol: 192 mg/dL (ref <200)
HDL: 74 mg/dL (ref >=50)
LDL Cholesterol (Calc): 103 mg/dL — ABNORMAL HIGH
Non-HDL Cholesterol (Calc): 118 mg/dL (ref <130)
Total CHOL/HDL Ratio: 2.6 (calc) (ref <5.0)
Triglycerides: 61 mg/dL (ref <150)

## 2024-06-27 LAB — VITAMIN B12: Vitamin B-12: 533 pg/mL (ref 200–1100)

## 2024-06-27 LAB — T4, FREE: Free T4: 1.5 ng/dL (ref 0.8–1.8)

## 2024-06-27 LAB — TSH: TSH: 1.49 m[IU]/L (ref 0.40–4.50)

## 2024-06-27 LAB — VITAMIN D 25 HYDROXY (VIT D DEFICIENCY, FRACTURES): Vit D, 25-Hydroxy: 65 ng/mL (ref 30–100)

## 2024-07-01 ENCOUNTER — Encounter (INDEPENDENT_AMBULATORY_CARE_PROVIDER_SITE_OTHER): Payer: Self-pay | Admitting: Nurse Practitioner

## 2024-07-01 NOTE — Progress Notes (Signed)
 Subjective:    Patient ID: Jacqueline Yoder, female    DOB: 02/28/1946, 78 y.o.   MRN: [id number] Chief Complaint  Patient presents with   Follow-up    - fu right leg + DVT (scheduled on 06/25/24) per FB     The patient is a 78 year old female who presents today for follow-up evaluation of right lower extremity edema.  She notes that the swelling worsened in her right leg after recent Port-A-Cath removal.  She has a history of DVT in the right lower extremity and had to pause her anticoagulation for several days due to the port removal.  She notes that the swelling began shortly thereafter.  She has continued with conservative therapy including some medical grade compression, elevation and activity.  She also has lymphedema pump in place as well.  She underwent DVT study which showed no evidence of DVT or superficial thrombophlebitis in the right lower extremity    Review of Systems  Cardiovascular:  Positive for leg swelling.  All other systems reviewed and are negative.      Objective:   Physical Exam Vitals reviewed.  HENT:     Head: Normocephalic.  Cardiovascular:     Rate and Rhythm: Normal rate.  Pulmonary:     Effort: Pulmonary effort is normal.  Musculoskeletal:     Right lower leg: Edema present.  Skin:    General: Skin is warm and dry.  Neurological:     Mental Status: She is alert and oriented to person, place, and time.     Motor: Weakness present.     Gait: Gait abnormal.  Psychiatric:        Mood and Affect: Mood normal.        Behavior: Behavior normal.        Thought Content: Thought content normal.        Judgment: Judgment normal.     BP 126/76   Pulse (!) 111   Ht 5' 4 (1.626 m)   Wt 156 lb (70.8 kg)   BMI 26.78 kg/m   Past Medical History:  Diagnosis Date   Allergy    Arthritis    Blood clot in vein    Family history of colon cancer    GERD (gastroesophageal reflux disease)    Hypercholesteremia    Hypertension    Hypertension    Lower  extremity edema    Personal history of chemotherapy    Rectal cancer (HCC) 12/2018   Urinary incontinence     Social History   Socioeconomic History   Marital status: Widowed    Spouse name: Not on file   Number of children: Not on file   Years of education: Not on file   Highest education level: Some college, no degree  Occupational History   Not on file  Tobacco Use   Smoking status: Former    Current packs/day: 0.00    Types: Cigarettes    Quit date: 12/02/1977    Years since quitting: 46.6   Smokeless tobacco: Former  Building services engineer status: Never Used  Substance and Sexual Activity   Alcohol use: Never   Drug use: Never   Sexual activity: Not Currently    Birth control/protection: None  Other Topics Concern   Not on file  Social History Narrative   Lives with daughter   Social Drivers of Health   Financial Resource Strain: Low Risk  (11/15/2023)   Overall Financial Resource Strain (CARDIA)    Difficulty  of Paying Living Expenses: Not hard at all  Food Insecurity: No Food Insecurity (05/04/2024)   Hunger Vital Sign    Worried About Running Out of Food in the Last Year: Never true    Ran Out of Food in the Last Year: Never true  Transportation Needs: No Transportation Needs (05/04/2024)   PRAPARE - Administrator, Civil Service (Medical): No    Lack of Transportation (Non-Medical): No  Physical Activity: Insufficiently Active (11/15/2023)   Exercise Vital Sign    Days of Exercise per Week: 3 days    Minutes of Exercise per Session: 10 min  Stress: No Stress Concern Present (11/15/2023)   [employer] of Occupational Health - Occupational Stress Questionnaire    Feeling of Stress : Not at all  Social Connections: Moderately Integrated (05/04/2024)   Social Connection and Isolation Panel    Frequency of Communication with Friends and Family: More than three times a week    Frequency of Social Gatherings with Friends and Family: More than three  times a week    Attends Religious Services: More than 4 times per year    Active Member of [employer] or Organizations: Yes    Attends Banker Meetings: More than 4 times per year    Marital Status: Widowed  Intimate Partner Violence: Not At Risk (05/04/2024)   Humiliation, Afraid, Rape, and Kick questionnaire    Fear of Current or Ex-Partner: No    Emotionally Abused: No    Physically Abused: No    Sexually Abused: No    Past Surgical History:  Procedure Laterality Date   ABDOMINAL HYSTERECTOMY     CHOLECYSTECTOMY  1971   COLONOSCOPY WITH PROPOFOL  N/A 12/03/2018   Procedure: COLONOSCOPY WITH PROPOFOL ;  Surgeon: Jinny Carmine, MD;  Location: [facility] ENDOSCOPY;  Service: Endoscopy;  Laterality: N/A;   COLONOSCOPY WITH PROPOFOL  N/A 07/15/2020   Procedure: COLONOSCOPY WITH PROPOFOL ;  Surgeon: Therisa Bi, MD;  Location: [facility] ENDOSCOPY;  Service: Gastroenterology;  Laterality: N/A;   FLEXIBLE SIGMOIDOSCOPY N/A 12/06/2018   Procedure: FLEXIBLE SIGMOIDOSCOPY;  Surgeon: Therisa Bi, MD;  Location[facility] ENDOSCOPY;  Service: Endoscopy;  Laterality: N/A;   LAPAROSCOPIC COLOSTOMY  01/06/2019   PORTACATH PLACEMENT N/A 04/03/2019   Procedure: INSERTION PORT-A-CATH;  Surgeon: Jordis Laneta FALCON, MD;  Location: [facility] ORS;  Service: General;  Laterality: N/A;    Family History  Problem Relation Age of Onset   Colon cancer Brother 30       exposure to chemicals [place]   Hypertension Mother    Stroke Mother    Kidney failure Father    Breast cancer Neg Hx    Ovarian cancer Neg Hx     Allergies  Allergen Reactions   Sulfamethoxazole-Trimethoprim Other (See Comments)    Also said she had chills and pressure in nose.       Latest Ref Rng & Units 05/15/2024    8:41 AM 05/07/2024    9:33 AM 05/05/2024    9:23 AM  CBC  WBC 4.0 - 10.5 K/uL 6.7  6.7  3.4   Hemoglobin 12.0 - 15.0 g/dL 88.7  88.5  9.6   Hematocrit 36.0 - 46.0 % 33.8  34.8  28.8   Platelets 150 - 400 K/uL 225  177  150        CMP     Component Value Date/Time   NA 135 05/15/2024 0841   K 3.6 05/15/2024 0841   CL 104 05/15/2024 0841   CO2 23  05/15/2024 0841   GLUCOSE 146 (H) 05/15/2024 0841   BUN 17 05/15/2024 0841   CREATININE 0.80 05/15/2024 0841   CREATININE 1.10 (H) 06/24/2021 0802   CALCIUM  9.1 05/15/2024 0841   PROT 7.2 05/15/2024 0841   ALBUMIN 3.6 05/15/2024 0841   AST 38 05/15/2024 0841   ALT 24 05/15/2024 0841   ALKPHOS 72 05/15/2024 0841   BILITOT 0.6 05/15/2024 0841   EGFR 53 (L) 06/24/2021 0802   GFRNONAA >60 05/15/2024 0841     No results found.     Assessment & Plan:   1. Lymphedema (Primary) The patient's ongoing swelling in the right lower extremity is likely combination of factors.  She has known postphlebitic issues in the right lower extremity post a DVT in the right lower extremity.  She is also currently dealing with metastasis to the skin area that is located in her right groin area.  Given that her lymph nodes are located in this region and is likely exacerbating her swelling in this area.  Based on this she is advised to continue with wound care to continue with her cancer treatments.  She is also advised to continue with conservative therapies including use of medical grade compression elevation and activity.  She can also continue with use of lymphedema pump as long as it is not disruptive to her wound healing.   Current Outpatient Medications on File Prior to Visit  Medication Sig Dispense Refill   acetaminophen  (TYLENOL ) 650 MG CR tablet Take 650 mg by mouth every 8 (eight) hours as needed for pain.     apixaban  (ELIQUIS ) 2.5 MG TABS tablet Take 1 tablet (2.5 mg total) by mouth 2 (two) times daily. 200 tablet 2   Cholecalciferol  (VITAMIN D3) 2000 units capsule Take 2,000 Units by mouth daily.     diphenoxylate -atropine  (LOMOTIL ) 2.5-0.025 MG tablet Take 1 tablet by mouth 4 (four) times daily as needed for diarrhea or loose stools. 120 tablet 0   fluticasone   (FLONASE ) 50 MCG/ACT nasal spray Place 1 spray into both nostrils daily as needed for allergies or rhinitis.     ipratropium (ATROVENT ) 0.03 % nasal spray Place 2 sprays into both nostrils 2 (two) times daily.     loratadine  (CLARITIN ) 10 MG tablet Take 10 mg by mouth daily.     magnesium  chloride (SLOW-MAG) 64 MG TBEC SR tablet Take 1 tablet (64 mg total) by mouth 2 (two) times daily. 60 tablet 2   Multiple Vitamins-Minerals (ONE-A-DAY WOMENS 50 PLUS PO) Take 1 tablet by mouth daily.      nystatin  (MYCOSTATIN /NYSTOP ) powder Apply 1 Application topically daily.     potassium chloride  SA (KLOR-CON  M) 20 MEQ tablet TAKE 1 TABLET BY MOUTH DAILY 100 tablet 1   simvastatin  (ZOCOR ) 40 MG tablet Take 1 tablet (40 mg total) by mouth at bedtime. 100 tablet 3   triamterene -hydrochlorothiazide  (DYAZIDE ) 37.5-25 MG capsule TAKE 1 CAPSULE BY MOUTH EACH  DAILY 90 capsule 1   zinc  gluconate 50 MG tablet Take 50 mg by mouth daily.     Current Facility-Administered Medications on File Prior to Visit  Medication Dose Route Frequency Provider Last Rate Last Admin   heparin  lock flush 100 UNIT/ML injection             There are no Patient Instructions on file for this visit. No follow-ups on file.   Kacy Hegna E Clenton Esper, NP

## 2024-07-02 ENCOUNTER — Ambulatory Visit (INDEPENDENT_AMBULATORY_CARE_PROVIDER_SITE_OTHER): Admitting: Family Medicine

## 2024-07-02 VITALS — BP 132/70 | HR 108 | Temp 99.0°F | Ht 64.0 in | Wt 163.1 lb

## 2024-07-02 DIAGNOSIS — J0110 Acute frontal sinusitis, unspecified: Secondary | ICD-10-CM | POA: Diagnosis not present

## 2024-07-02 DIAGNOSIS — H6993 Unspecified Eustachian tube disorder, bilateral: Secondary | ICD-10-CM

## 2024-07-02 LAB — POC COVID19/FLU A&B COMBO
Covid Antigen, POC: NEGATIVE
Influenza A Antigen, POC: NEGATIVE
Influenza B Antigen, POC: NEGATIVE

## 2024-07-02 MED ORDER — AMOXICILLIN-POT CLAVULANATE 875-125 MG PO TABS: 1.0000 | ORAL_TABLET | Freq: Two times a day (BID) | ORAL | 0 refills | Status: DC

## 2024-07-02 NOTE — Patient Instructions (Addendum)
 Thank you for coming to the office today.  COVID Flu Negative today  Likely sinusitis If not improving use antibiotic  Switch claritin  to Allegra  Keep using nasal steroid Flonase  2 sprays in each nostril daily for 4-6 weeks, may repeat course seasonally or as needed  May use OTC sinus medications, caution with side effects  Please schedule a Follow-up Appointment to: Return if symptoms worsen or fail to improve.  If you have any other questions or concerns, please feel free to call the office or send a message through MyChart. You may also schedule an earlier appointment if necessary.  Additionally, you may be receiving a survey about your experience at our office within a few days to 1 week by e-mail or mail. We value your feedback.  [name], DO [facility], [place]

## 2024-07-02 NOTE — Progress Notes (Signed)
 Subjective:    Patient ID: GAL SMOLINSKI, female    DOB: 11-09-1945, 78 y.o.   MRN: [id number]  Jacqueline Yoder is a 78 y.o. female presenting on 07/02/2024 for Sinus Problem   HPI  Discussed the use of AI scribe software for clinical note transcription with the patient, who gave verbal consent to proceed.  History of Present Illness   Jacqueline Yoder is a 78 year old female who presents with headache and sinus pressure.  Sinusitis / Headache - Headache and sinus pressure began one day last week - Headache localized to the right side of the head - Associated with gum or dental pain R side - Initial episode resolved with Tylenol  by the next morning - Symptoms recurred last night - No fever - Some sweating present - No persistent cough; only coughed a couple of times, attributed to aspiration while drinking water - History of sinus issues with prior episodes of significant facial swelling - Uses Flonase  nasal spray inconsistently for symptom management - Previously used Allegra, now on Claritin .       05/07/2024    8:51 AM 05/01/2024   10:01 AM 01/08/2024   10:49 AM  Depression screen PHQ 2/9  Decreased Interest 0 0 0  Down, Depressed, Hopeless 0 0 0  PHQ - 2 Score 0 0 0  Altered sleeping   0  Tired, decreased energy   0  Change in appetite   0  Feeling bad or failure about yourself    0  Trouble concentrating   0  Moving slowly or fidgety/restless   0  Suicidal thoughts   0  PHQ-9 Score   0  Difficult doing work/chores   Not difficult at all       01/08/2024   10:49 AM 09/26/2023    1:59 PM 06/11/2023    2:48 PM 01/03/2023    9:25 AM  GAD 7 : Generalized Anxiety Score  Nervous, Anxious, on Edge 0 0 0 0  Control/stop worrying 0 0 0 0  Worry too much - different things 0 0 0 0  Trouble relaxing 0 0 0 0  Restless 0 0 0 0  Easily annoyed or irritable 0 0 0 0  Afraid - awful might happen 0 0 0 0  Total GAD 7 Score 0 0 0 0  Anxiety Difficulty Not difficult at all   Not difficult at all Not difficult at all    Social History   Tobacco Use   Smoking status: Former    Current packs/day: 0.00    Types: Cigarettes    Quit date: 12/02/1977    Years since quitting: 46.6   Smokeless tobacco: Former  Building services engineer status: Never Used  Substance Use Topics   Alcohol use: Never   Drug use: Never    Review of Systems Per HPI unless specifically indicated above     Objective:    BP 132/70 (BP Location: Right Arm, Patient Position: Sitting, Cuff Size: Normal)   Pulse (!) 108   Temp 99 F (37.2 C) (Oral)   Ht 5' 4 (1.626 m)   Wt 163 lb 2 oz (74 kg)   SpO2 98%   BMI 28.00 kg/m   Wt Readings from Last 3 Encounters:  07/02/24 163 lb 2 oz (74 kg)  06/26/24 156 lb (70.8 kg)  06/18/24 155 lb 12.8 oz (70.7 kg)    Physical Exam Vitals and nursing note reviewed.  Constitutional:  General: She is not in acute distress.    Appearance: Normal appearance. She is well-developed. She is not diaphoretic.     Comments: Well-appearing, comfortable, cooperative  HENT:     Head: Normocephalic and atraumatic.     Right Ear: Ear canal and external ear normal. There is no impacted cerumen.     Ears:     Comments: R TM with some mild clear effusion Eyes:     General:        Right eye: No discharge.        Left eye: No discharge.     Conjunctiva/sclera: Conjunctivae normal.  Cardiovascular:     Rate and Rhythm: Normal rate.  Pulmonary:     Effort: Pulmonary effort is normal.  Skin:    General: Skin is warm and dry.     Findings: No erythema or rash.  Neurological:     Mental Status: She is alert and oriented to person, place, and time.  Psychiatric:        Mood and Affect: Mood normal.        Behavior: Behavior normal.        Thought Content: Thought content normal.     Comments: Well groomed, good eye contact, normal speech and thoughts     Results for orders placed or performed in visit on 07/02/24  POC Covid19/Flu A&B Antigen    Collection Time: 07/02/24  1:48 PM  Result Value Ref Range   Influenza A Antigen, POC Negative Negative   Influenza B Antigen, POC Negative Negative   Covid Antigen, POC Negative Negative   *Note: Due to a large number of results and/or encounters for the requested time period, some results have not been displayed. A complete set of results can be found in Results Review.      Assessment & Plan:   Problem List Items Addressed This Visit   None Visit Diagnoses       Acute non-recurrent frontal sinusitis    -  Primary   Relevant Medications   amoxicillin -clavulanate (AUGMENTIN ) 875-125 MG tablet   Other Relevant Orders   POC Covid19/Flu A&B Antigen (Completed)     Dysfunction of both eustachian tubes            Acute sinusitis with allergic rhinitis Symptoms suggest sinusitis and allergic rhinitis, exacerbated by sinus pressure and swelling. COVID and flu tests negative. No identified bacterial or other focal etiology on exam. Reassurance today - Switch from Claritin  to Allegra for stronger allergy control. - Continue Flonase  nasal spray. - Use OTC sinus medications as needed. - Order Augmentin  as backup if symptoms worsen only     Orders Placed This Encounter  Procedures   POC Covid19/Flu A&B Antigen    Meds ordered this encounter  Medications   amoxicillin -clavulanate (AUGMENTIN ) 875-125 MG tablet    Sig: Take 1 tablet by mouth 2 (two) times daily.    Dispense:  20 tablet    Refill:  0    Follow up plan: Return if symptoms worsen or fail to improve.   [name], DO [facility] 07/02/2024, 1:47 PM

## 2024-07-08 ENCOUNTER — Encounter: Admitting: Physician Assistant

## 2024-07-08 DIAGNOSIS — Z85048 Personal history of other malignant neoplasm of rectum, rectosigmoid junction, and anus: Secondary | ICD-10-CM | POA: Diagnosis not present

## 2024-07-08 DIAGNOSIS — C4499 Other specified malignant neoplasm of skin, unspecified: Secondary | ICD-10-CM | POA: Diagnosis not present

## 2024-07-08 DIAGNOSIS — L98492 Non-pressure chronic ulcer of skin of other sites with fat layer exposed: Secondary | ICD-10-CM | POA: Diagnosis not present

## 2024-07-08 DIAGNOSIS — C3412 Malignant neoplasm of upper lobe, left bronchus or lung: Secondary | ICD-10-CM | POA: Diagnosis not present

## 2024-07-08 DIAGNOSIS — Z86711 Personal history of pulmonary embolism: Secondary | ICD-10-CM | POA: Diagnosis not present

## 2024-07-08 DIAGNOSIS — Z7901 Long term (current) use of anticoagulants: Secondary | ICD-10-CM | POA: Diagnosis not present

## 2024-07-09 ENCOUNTER — Other Ambulatory Visit

## 2024-07-11 ENCOUNTER — Ambulatory Visit (INDEPENDENT_AMBULATORY_CARE_PROVIDER_SITE_OTHER): Payer: Self-pay

## 2024-07-11 ENCOUNTER — Telehealth (INDEPENDENT_AMBULATORY_CARE_PROVIDER_SITE_OTHER): Payer: Self-pay | Admitting: Nurse Practitioner

## 2024-07-11 ENCOUNTER — Encounter: Payer: Self-pay | Admitting: Family Medicine

## 2024-07-11 DIAGNOSIS — Z0000 Encounter for general adult medical examination without abnormal findings: Secondary | ICD-10-CM | POA: Diagnosis not present

## 2024-07-11 DIAGNOSIS — I890 Lymphedema, not elsewhere classified: Secondary | ICD-10-CM

## 2024-07-11 NOTE — Telephone Encounter (Signed)
 Patient daughter has been given requested information. The company for lymphedema pump to contact [employer]

## 2024-07-11 NOTE — Patient Instructions (Addendum)
 Jacqueline Yoder,  Thank you for taking the time for your Medicare Wellness Visit. I appreciate your continued commitment to your health goals. Please review the care plan we discussed, and feel free to reach out if I can assist you further.  Medicare recommends these wellness visits once per year to help you and your care team stay ahead of potential health issues. These visits are designed to focus on prevention, allowing your provider to concentrate on managing your acute and chronic conditions during your regular appointments.  Please note that Annual Wellness Visits do not include a physical exam. Some assessments may be limited, especially if the visit was conducted virtually. If needed, we may recommend a separate in-person follow-up with your provider.  Ongoing Care Seeing your primary care provider every 3 to 6 months helps us  monitor your health and provide consistent, personalized care.   Referrals If a referral was made during today's visit and you haven't received any updates within two weeks, please contact the referred provider directly to check on the status.  Recommended Screenings:  Health Maintenance  Topic Date Due   Hepatitis C Screening  Never done   Zoster (Shingles) Vaccine (1 of 2) Never done   Colon Cancer Screening  07/15/2021   Flu Shot  05/16/2024   COVID-19 Vaccine (6 - 2025-26 season) 06/16/2024   Medicare Annual Wellness Visit  07/11/2025   DEXA scan (bone density measurement)  11/18/2028   Pneumococcal Vaccine for age over 40  Completed   HPV Vaccine  Aged Out   Meningitis B Vaccine  Aged Out   DTaP/Tdap/Td vaccine  Discontinued   Breast Cancer Screening  Discontinued       07/11/2024   11:03 AM  Advanced Directives  Does Patient Have a Medical Advance Directive? Yes  Type of Estate agent of [place];Living will  Does patient want to make changes to medical advance directive? No - Patient declined  Copy of Healthcare Power of  Attorney in Chart? Yes - validated most recent copy scanned in chart (See row information)   Advance Care Planning is important because it: Ensures you receive medical care that aligns with your values, goals, and preferences. Provides guidance to your family and loved ones, reducing the emotional burden of decision-making during critical moments.  Vision: Annual vision screenings are recommended for early detection of glaucoma, cataracts, and diabetic retinopathy. These exams can also reveal signs of chronic conditions such as diabetes and high blood pressure.  Dental: Annual dental screenings help detect early signs of oral cancer, gum disease, and other conditions linked to overall health, including heart disease and diabetes.  Please see the attached documents for additional preventive care recommendations.   NEXT AWV 07/24/25 @ 12:40 PM BY PHONE

## 2024-07-11 NOTE — Telephone Encounter (Signed)
 Patient's daughter (Latisha)called stating that they need to name of the company for the the lymphedema pump. Something is going on with the pump and they need the number. Patient had misplaced the information.

## 2024-07-11 NOTE — Progress Notes (Signed)
 Subjective:   Jacqueline Yoder is a 78 y.o. who presents for a Medicare Wellness preventive visit.  As a reminder, Annual Wellness Visits don't include a physical exam, and some assessments may be limited, especially if this visit is performed virtually. We may recommend an in-person follow-up visit with your provider if needed.  Visit Complete: Virtual I connected with  [place] JULIANNA Flatter on 07/11/24 by a audio enabled telemedicine application and verified that I am speaking with the correct person using two identifiers.  Patient Location: Home  Provider Location: Home Office  I discussed the limitations of evaluation and management by telemedicine. The patient expressed understanding and agreed to proceed.  Vital Signs: Because this visit was a virtual/telehealth visit, some criteria may be missing or patient reported. Any vitals not documented were not able to be obtained and vitals that have been documented are patient reported.  VideoDeclined- This patient declined Librarian, academic. Therefore the visit was completed with audio only.  Persons Participating in Visit: Patient.  AWV Questionnaire: No: Patient Medicare AWV questionnaire was not completed prior to this visit.  Cardiac Risk Factors include: advanced age (>10men, >6 women);dyslipidemia;hypertension;sedentary lifestyle     Objective:    Today's Vitals   07/11/24 1057  PainSc: 4    There is no height or weight on file to calculate BMI.     07/11/2024   11:03 AM 05/07/2024    8:48 AM 05/04/2024    7:00 AM 05/04/2024   12:05 AM 05/01/2024    9:01 AM 04/03/2024    8:41 AM 03/06/2024    8:33 AM  Advanced Directives  Does Patient Have a Medical Advance Directive? Yes Yes Yes No Yes Yes Yes  Type of Estate agent of [place];Living will  Living will;Healthcare Power of Attorney  Living will;Healthcare Power of [employer] Power of [place];Living will  Does patient  want to make changes to medical advance directive? No - Patient declined  No - Patient declined  Yes (ED - Information included in AVS)    Copy of Healthcare Power of Attorney in Chart? Yes - validated most recent copy scanned in chart (See row information)  Yes - validated most recent copy scanned in chart (See row information)    Yes - validated most recent copy scanned in chart (See row information)  Would patient like information on creating a medical advance directive?   No - Patient declined  Yes (ED - Information included in AVS)      Current Medications (verified) Outpatient Encounter Medications as of 07/11/2024  Medication Sig   acetaminophen  (TYLENOL ) 650 MG CR tablet Take 650 mg by mouth every 8 (eight) hours as needed for pain.   amoxicillin -clavulanate (AUGMENTIN ) 875-125 MG tablet Take 1 tablet by mouth 2 (two) times daily.   apixaban  (ELIQUIS ) 2.5 MG TABS tablet Take 1 tablet (2.5 mg total) by mouth 2 (two) times daily.   Cholecalciferol  (VITAMIN D3) 2000 units capsule Take 2,000 Units by mouth daily.   diphenoxylate -atropine  (LOMOTIL ) 2.5-0.025 MG tablet Take 1 tablet by mouth 4 (four) times daily as needed for diarrhea or loose stools.   fluticasone  (FLONASE ) 50 MCG/ACT nasal spray Place 1 spray into both nostrils daily as needed for allergies or rhinitis.   ipratropium (ATROVENT ) 0.03 % nasal spray Place 2 sprays into both nostrils 2 (two) times daily.   loratadine  (CLARITIN ) 10 MG tablet Take 10 mg by mouth daily.   magnesium  chloride (SLOW-MAG) 64 MG TBEC  SR tablet Take 1 tablet (64 mg total) by mouth 2 (two) times daily.   Multiple Vitamins-Minerals (ONE-A-DAY WOMENS 50 PLUS PO) Take 1 tablet by mouth daily.    nystatin  (MYCOSTATIN /NYSTOP ) powder Apply 1 Application topically daily.   potassium chloride  SA (KLOR-CON  M) 20 MEQ tablet TAKE 1 TABLET BY MOUTH DAILY   simvastatin  (ZOCOR ) 40 MG tablet Take 1 tablet (40 mg total) by mouth at bedtime.    triamterene -hydrochlorothiazide  (DYAZIDE ) 37.5-25 MG capsule TAKE 1 CAPSULE BY MOUTH EACH  DAILY   zinc  gluconate 50 MG tablet Take 50 mg by mouth daily.   Facility-Administered Encounter Medications as of 07/11/2024  Medication   heparin  lock flush 100 UNIT/ML injection    Allergies (verified) Sulfamethoxazole-trimethoprim   History: Past Medical History:  Diagnosis Date   Allergy    Arthritis    Blood clot in vein    Family history of colon cancer    GERD (gastroesophageal reflux disease)    Hypercholesteremia    Hypertension    Hypertension    Lower extremity edema    Personal history of chemotherapy    Rectal cancer (HCC) 12/2018   Urinary incontinence    Past Surgical History:  Procedure Laterality Date   ABDOMINAL HYSTERECTOMY     CHOLECYSTECTOMY  1971   COLONOSCOPY WITH PROPOFOL  N/A 12/03/2018   Procedure: COLONOSCOPY WITH PROPOFOL ;  Surgeon: Jinny Carmine, MD;  Location: [facility] ENDOSCOPY;  Service: Endoscopy;  Laterality: N/A;   COLONOSCOPY WITH PROPOFOL  N/A 07/15/2020   Procedure: COLONOSCOPY WITH PROPOFOL ;  Surgeon: Therisa Bi, MD;  Location: [facility] ENDOSCOPY;  Service: Gastroenterology;  Laterality: N/A;   FLEXIBLE SIGMOIDOSCOPY N/A 12/06/2018   Procedure: FLEXIBLE SIGMOIDOSCOPY;  Surgeon: Therisa Bi, MD;  Location: [facility] ENDOSCOPY;  Service: Endoscopy;  Laterality: N/A;   LAPAROSCOPIC COLOSTOMY  01/06/2019   PORTACATH PLACEMENT N/A 04/03/2019   Procedure: INSERTION PORT-A-CATH;  Surgeon: Jordis Laneta FALCON, MD;  Location: [facility] ORS;  Service: General;  Laterality: N/A;   Family History  Problem Relation Age of Onset   Colon cancer Brother 61       exposure to chemicals [place]   Hypertension Mother    Stroke Mother    Kidney failure Father    Breast cancer Neg Hx    Ovarian cancer Neg Hx    Social History   Socioeconomic History   Marital status: Widowed    Spouse name: Not on file   Number of children: Not on file   Years of education: Not on file   Highest  education level: Some college, no degree  Occupational History   Not on file  Tobacco Use   Smoking status: Former    Current packs/day: 0.00    Types: Cigarettes    Quit date: 12/02/1977    Years since quitting: 46.6   Smokeless tobacco: Former  Building services engineer status: Never Used  Substance and Sexual Activity   Alcohol use: Never   Drug use: Never   Sexual activity: Not Currently    Birth control/protection: None  Other Topics Concern   Not on file  Social History Narrative   Lives with daughter   Social Drivers of Health   Financial Resource Strain: Low Risk  (07/11/2024)   Overall Financial Resource Strain (CARDIA)    Difficulty of Paying Living Expenses: Not hard at all  Food Insecurity: No Food Insecurity (07/11/2024)   Hunger Vital Sign    Worried About Running Out of Food in the Last Year: Never true  Ran Out of Food in the Last Year: Never true  Transportation Needs: No Transportation Needs (07/11/2024)   PRAPARE - Administrator, Civil Service (Medical): No    Lack of Transportation (Non-Medical): No  Physical Activity: Insufficiently Active (07/11/2024)   Exercise Vital Sign    Days of Exercise per Week: 2 days    Minutes of Exercise per Session: 20 min  Stress: No Stress Concern Present (07/11/2024)   [employer] of Occupational Health - Occupational Stress Questionnaire    Feeling of Stress: Only a little  Social Connections: Moderately Integrated (07/11/2024)   Social Connection and Isolation Panel    Frequency of Communication with Friends and Family: More than three times a week    Frequency of Social Gatherings with Friends and Family: More than three times a week    Attends Religious Services: More than 4 times per year    Active Member of [employer] or Organizations: Yes    Attends Banker Meetings: More than 4 times per year    Marital Status: Widowed    Tobacco Counseling Counseling given: Not Answered    Clinical  Intake:  Pre-visit preparation completed: Yes  Pain : 0-10 Pain Score: 4  Pain Type: Chronic pain Pain Location: Shoulder Pain Orientation: Right Pain Radiating Towards: neck Pain Descriptors / Indicators: Aching, Burning, Constant Pain Onset: More than a month ago Pain Frequency: Constant Pain Relieving Factors: tylenol   Pain Relieving Factors: tylenol   BMI - recorded: 28 Nutritional Status: BMI 25 -29 Overweight Nutritional Risks: None Diabetes: No  Lab Results  Component Value Date   HGBA1C 5.0 06/26/2024   HGBA1C 5.1 07/04/2023   HGBA1C 5.3 06/24/2021     How often do you need to have someone help you when you read instructions, pamphlets, or other written materials from your doctor or pharmacy?: 1 - Never  Interpreter Needed?: No  Information entered by :: JHONNIE DAS, LPN   Activities of Daily Living    07/11/2024   11:05 AM 07/08/2024    2:50 PM  In your present state of health, do you have any difficulty performing the following activities:  Hearing? 1 0  Vision? 1 1  Difficulty concentrating or making decisions? 1 1  Walking or climbing stairs? 1 1  Dressing or bathing? 0 0  Doing errands, shopping? 1 1  Preparing Food and eating ? Y Y  Using the Toilet? N N  In the past six months, have you accidently leaked urine? N N  Do you have problems with loss of bowel control? N N  Managing your Medications? N N  Managing your Finances? N N  Housekeeping or managing your Housekeeping? CINDERELLA CINDERELLA    Patient Care Team: Edman Marsa PARAS, DO as PCP - General (Family Medicine) Maurie Rayfield BIRCH, RN as Oncology Nurse Navigator Babara Call, MD as Consulting Physician (Oncology) Brahmanday, Govinda R, MD as Consulting Physician (Oncology)  I have updated your Care Teams any recent Medical Services you may have received from other providers in the past year.     Assessment:   This is a routine wellness examination for [facility].  Hearing/Vision screen Hearing  Screening - Comments:: WEARS AIDS Vision Screening - Comments:: WEARS GLASSES ALL DAY- [facility]   Goals Addressed             This Visit's Progress    Cut out extra servings         Depression Screen     07/11/2024  11:02 AM 05/07/2024    8:51 AM 05/01/2024   10:01 AM 01/08/2024   10:49 AM 09/26/2023    1:59 PM 07/06/2023   10:35 AM 06/11/2023    2:47 PM  PHQ 2/9 Scores  PHQ - 2 Score 1 0 0 0 0 0 0  PHQ- 9 Score 3   0  0 0    Fall Risk     07/08/2024    2:50 PM 01/08/2024   10:49 AM 09/26/2023    1:59 PM 07/06/2023   10:38 AM 06/11/2023    2:48 PM  Fall Risk   Falls in the past year? 0 0 0 0 0  Number falls in past yr: 0   0 0  Injury with Fall? 0   0 0  Risk for fall due to :    No Fall Risks No Fall Risks  Follow up Falls evaluation completed;Falls prevention discussed   Falls prevention discussed;Falls evaluation completed Falls evaluation completed    MEDICARE RISK AT HOME:  Medicare Risk at Home Any stairs in or around the home?: Yes If so, are there any without handrails?: No Home free of loose throw rugs in walkways, pet beds, electrical cords, etc?: Yes Adequate lighting in your home to reduce risk of falls?: Yes Life alert?: No Use of a cane, walker or w/c?: Yes (WALKER) Grab bars in the bathroom?: No Shower chair or bench in shower?: No Elevated toilet seat or a handicapped toilet?: Yes  TIMED UP AND GO:  Was the test performed?  No  Cognitive Function: 6CIT completed        07/11/2024   11:06 AM 07/06/2023   10:40 AM 06/30/2022    9:57 AM 06/28/2021    9:05 AM  6CIT Screen  What Year? 0 points 0 points 0 points 0 points  What month? 0 points 0 points 0 points 0 points  What time? 3 points 0 points 0 points 3 points  Count back from 20 0 points 0 points 0 points 0 points  Months in reverse 0 points 0 points 0 points 0 points  Repeat phrase 0 points 0 points 0 points 4 points  Total Score 3 points 0 points 0 points 7 points     Immunizations Immunization History  Administered Date(s) Administered   Fluad Quad(high Dose 65+) 07/23/2020, 06/27/2021, 07/05/2022   Fluad Trivalent(High Dose 65+) 07/11/2023   Influenza Inj Mdck Quad Pf 10/03/2016, 08/26/2019   Influenza Split 10/01/2015   Influenza-Unspecified 08/08/2017, 07/30/2018, 07/18/2019   PFIZER Comirnaty(Gray Top)Covid-19 Tri-Sucrose Vaccine 12/01/2019, 12/23/2019   PFIZER(Purple Top)SARS-COV-2 Vaccination 12/01/2019, 12/23/2019, 08/02/2020   Pneumococcal Conjugate-13 10/24/2016   Pneumococcal Polysaccharide-23 03/30/2015    Screening Tests Health Maintenance  Topic Date Due   Hepatitis C Screening  Never done   Zoster Vaccines- Shingrix (1 of 2) Never done   Colonoscopy  07/15/2021   Influenza Vaccine  05/16/2024   COVID-19 Vaccine (6 - 2025-26 season) 06/16/2024   Medicare Annual Wellness (AWV)  07/11/2025   DEXA SCAN  11/18/2028   Pneumococcal Vaccine: 50+ Years  Completed   HPV VACCINES  Aged Out   Meningococcal B Vaccine  Aged Out   DTaP/Tdap/Td  Discontinued   Mammogram  Discontinued    Health Maintenance Items Addressed: NEEDS TDAP, PNA- MAMMOGRAM & BDS UP TO DATE, AGED OUT OF COLONOSCOPY   Additional Screening:  Vision Screening: Recommended annual ophthalmology exams for early detection of glaucoma and other disorders of the eye. Is the patient up to  date with their annual eye exam?  Yes  Who is the provider or what is the name of the office in which the patient attends annual eye exams? [facility]  Dental Screening: Recommended annual dental exams for proper oral hygiene  Community Resource Referral / Chronic Care Management: CRR required this visit?  No   CCM required this visit?  No   Plan:    I have personally reviewed and noted the following in the patient's chart:   Medical and social history Use of alcohol, tobacco or illicit drugs  Current medications and supplements including opioid prescriptions. Patient is not  currently taking opioid prescriptions. Functional ability and status Nutritional status Physical activity Advanced directives List of other physicians Hospitalizations, surgeries, and ER visits in previous 12 months Vitals Screenings to include cognitive, depression, and falls Referrals and appointments  In addition, I have reviewed and discussed with patient certain preventive protocols, quality metrics, and best practice recommendations. A written personalized care plan for preventive services as well as general preventive health recommendations were provided to patient.   Jhonnie GORMAN Das, LPN   [id number]   After Visit Summary: (MyChart) Due to this being a telephonic visit, the after visit summary with patients personalized plan was offered to patient via MyChart   Notes: Nothing significant to report at this time.

## 2024-07-14 ENCOUNTER — Other Ambulatory Visit

## 2024-07-14 DIAGNOSIS — Z7901 Long term (current) use of anticoagulants: Secondary | ICD-10-CM | POA: Diagnosis not present

## 2024-07-15 DIAGNOSIS — Z483 Aftercare following surgery for neoplasm: Secondary | ICD-10-CM | POA: Diagnosis not present

## 2024-07-15 DIAGNOSIS — Z433 Encounter for attention to colostomy: Secondary | ICD-10-CM | POA: Diagnosis not present

## 2024-07-15 DIAGNOSIS — C20 Malignant neoplasm of rectum: Secondary | ICD-10-CM | POA: Diagnosis not present

## 2024-07-16 ENCOUNTER — Ambulatory Visit: Admission: RE | Admit: 2024-07-16 | Source: Home / Self Care

## 2024-07-16 ENCOUNTER — Telehealth: Payer: Self-pay

## 2024-07-16 ENCOUNTER — Encounter: Payer: Self-pay | Admitting: Family Medicine

## 2024-07-16 ENCOUNTER — Ambulatory Visit (INDEPENDENT_AMBULATORY_CARE_PROVIDER_SITE_OTHER): Admitting: Nurse Practitioner

## 2024-07-16 ENCOUNTER — Ambulatory Visit: Admitting: Family Medicine

## 2024-07-16 ENCOUNTER — Encounter: Admitting: Family Medicine

## 2024-07-16 VITALS — BP 120/68 | HR 101 | Ht 64.0 in

## 2024-07-16 DIAGNOSIS — G8929 Other chronic pain: Secondary | ICD-10-CM

## 2024-07-16 DIAGNOSIS — Z0000 Encounter for general adult medical examination without abnormal findings: Secondary | ICD-10-CM

## 2024-07-16 DIAGNOSIS — Z933 Colostomy status: Secondary | ICD-10-CM | POA: Diagnosis not present

## 2024-07-16 DIAGNOSIS — S42294D Other nondisplaced fracture of upper end of right humerus, subsequent encounter for fracture with routine healing: Secondary | ICD-10-CM

## 2024-07-16 DIAGNOSIS — C799 Secondary malignant neoplasm of unspecified site: Secondary | ICD-10-CM

## 2024-07-16 DIAGNOSIS — M25511 Pain in right shoulder: Secondary | ICD-10-CM | POA: Diagnosis not present

## 2024-07-16 DIAGNOSIS — M898X1 Other specified disorders of bone, shoulder: Secondary | ICD-10-CM | POA: Diagnosis not present

## 2024-07-16 DIAGNOSIS — R970 Elevated carcinoembryonic antigen [CEA]: Secondary | ICD-10-CM | POA: Diagnosis not present

## 2024-07-16 DIAGNOSIS — Z23 Encounter for immunization: Secondary | ICD-10-CM | POA: Diagnosis not present

## 2024-07-16 DIAGNOSIS — E782 Mixed hyperlipidemia: Secondary | ICD-10-CM | POA: Diagnosis not present

## 2024-07-16 DIAGNOSIS — I129 Hypertensive chronic kidney disease with stage 1 through stage 4 chronic kidney disease, or unspecified chronic kidney disease: Secondary | ICD-10-CM | POA: Diagnosis not present

## 2024-07-16 DIAGNOSIS — R5381 Other malaise: Secondary | ICD-10-CM

## 2024-07-16 DIAGNOSIS — M1612 Unilateral primary osteoarthritis, left hip: Secondary | ICD-10-CM

## 2024-07-16 DIAGNOSIS — N182 Chronic kidney disease, stage 2 (mild): Secondary | ICD-10-CM

## 2024-07-16 NOTE — Addendum Note (Signed)
 Addended by: EDMAN MARSA PARAS on: 07/16/2024 06:07 PM   Modules accepted: Orders

## 2024-07-16 NOTE — Progress Notes (Signed)
 Subjective:    Patient ID: Jacqueline Yoder, female    DOB: Jul 07, 1946, 78 y.o.   MRN: [id number]  Jacqueline Yoder is a 78 y.o. female presenting on 07/16/2024 for Annual Exam   HPI  ADDITIONAL REQUEST -  MOBILITY EXAMINATION   Discussed the use of AI scribe software for clinical note transcription with the patient, who gave verbal consent to proceed.  History of Present Illness   Jacqueline Yoder is a 78 year old female who presents for a wellness check and physical exam. She is accompanied by a caregiver.  History Right Upper Extremity Fracture Musculoskeletal pain and functional limitation R Clavicle swelling pain - Significant pain in the right shoulder and neck area, sore to palpation and worsened by movement - Pain present for an extended duration, affecting ability to use the right arm - Compensatory use of the left arm, which may contribute to pain - Pain with deep inspiration - History of right shoulder fracture in March secondary to tumor, treated with radiation - Current pain management includes extra strength Tylenol  and sparing use of oxycodone ; previously prescribed tramadol  - Has seen Orthopedics, limited success, not operative candidate  Lipid management and laboratory findings - Currently taking simvastatin  40 mg for hyperlipidemia - Recent cholesterol level slightly elevated - Vitamin D  and B12 levels within normal range - Thyroid  function tests within normal range - Hemoglobin A1c at 5.0  Anemia - History of anemia - Recent hemoglobin levels in the eleven range  Preventive care - Received influenza vaccination     Metastatic Colorectal Cancer Followed by [facility] 05/2024 newly established after transfer care   Constitutional symptoms - Persistent fatigue since hospitalization - Energy levels have not returned to baseline - Recent weight loss to 149 pounds following obstruction, with subsequent regain to 152 pounds   Musculoskeletal symptoms - Joint  stiffness, particularly in the legs - Describes sensation as 'whole body stiff'   Anemia, macrocytic Recently had Hgb 9.6, received transfusion and IV iron infusion - Low red blood cell count following hospitalization  Lymphedema Lower Ext Has lymphedema pump Awaiting referral to Lymphedema clinic PT        07/11/2024   11:02 AM 05/07/2024    8:51 AM 05/01/2024   10:01 AM  Depression screen PHQ 2/9  Decreased Interest 0 0 0  Down, Depressed, Hopeless 1 0 0  PHQ - 2 Score 1 0 0  Altered sleeping 0    Tired, decreased energy 1    Change in appetite 0    Feeling bad or failure about yourself  1    Trouble concentrating 0    Moving slowly or fidgety/restless 0    Suicidal thoughts 0    PHQ-9 Score 3    Difficult doing work/chores Not difficult at all         01/08/2024   10:49 AM 09/26/2023    1:59 PM 06/11/2023    2:48 PM 01/03/2023    9:25 AM  GAD 7 : Generalized Anxiety Score  Nervous, Anxious, on Edge 0 0 0 0  Control/stop worrying 0 0 0 0  Worry too much - different things 0 0 0 0  Trouble relaxing 0 0 0 0  Restless 0 0 0 0  Easily annoyed or irritable 0 0 0 0  Afraid - awful might happen 0 0 0 0  Total GAD 7 Score 0 0 0 0  Anxiety Difficulty Not difficult at all  Not difficult at all Not  difficult at all     Past Medical History:  Diagnosis Date   Allergy    Arthritis    Blood clot in vein    Family history of colon cancer    GERD (gastroesophageal reflux disease)    Hypercholesteremia    Hypertension    Hypertension    Lower extremity edema    Personal history of chemotherapy    Rectal cancer (HCC) 12/2018   Urinary incontinence    Past Surgical History:  Procedure Laterality Date   ABDOMINAL HYSTERECTOMY     CHOLECYSTECTOMY  1971   COLONOSCOPY WITH PROPOFOL  N/A 12/03/2018   Procedure: COLONOSCOPY WITH PROPOFOL ;  Surgeon: Jinny Carmine, MD;  Location: [facility] ENDOSCOPY;  Service: Endoscopy;  Laterality: N/A;   COLONOSCOPY WITH PROPOFOL  N/A 07/15/2020    Procedure: COLONOSCOPY WITH PROPOFOL ;  Surgeon: Therisa Bi, MD;  [facility];  Service: Gastroenterology;  Laterality: N/A;   FLEXIBLE SIGMOIDOSCOPY N/A 12/06/2018   Procedure: FLEXIBLE SIGMOIDOSCOPY;  Surgeon: Therisa Bi, MD;  Location: [facility] ENDOSCOPY;  Service: Endoscopy;  Laterality: N/A;   LAPAROSCOPIC COLOSTOMY  01/06/2019   PORTACATH PLACEMENT N/A 04/03/2019   Procedure: INSERTION PORT-A-CATH;  Surgeon: Jordis Laneta FALCON, MD;  Location: [facility] ORS;  Service: General;  Laterality: N/A;   Social History   Socioeconomic History   Marital status: Widowed    Spouse name: Not on file   Number of children: Not on file   Years of education: Not on file   Highest education level: Some college, no degree  Occupational History   Not on file  Tobacco Use   Smoking status: Former    Current packs/day: 0.00    Types: Cigarettes    Quit date: 12/02/1977    Years since quitting: 46.6   Smokeless tobacco: Former  Building services engineer status: Never Used  Substance and Sexual Activity   Alcohol use: Never   Drug use: Never   Sexual activity: Not Currently    Birth control/protection: None  Other Topics Concern   Not on file  Social History Narrative   Lives with daughter   Social Drivers of Health   Financial Resource Strain: Low Risk  (07/11/2024)   Overall Financial Resource Strain (CARDIA)    Difficulty of Paying Living Expenses: Not hard at all  Food Insecurity: No Food Insecurity (07/11/2024)   Hunger Vital Sign    Worried About Running Out of Food in the Last Year: Never true    Ran Out of Food in the Last Year: Never true  Transportation Needs: No Transportation Needs (07/11/2024)   PRAPARE - Administrator, Civil Service (Medical): No    Lack of Transportation (Non-Medical): No  Physical Activity: Insufficiently Active (07/11/2024)   Exercise Vital Sign    Days of Exercise per Week: 2 days    Minutes of Exercise per Session: 20 min  Stress: No Stress  Concern Present (07/11/2024)   [employer] of Occupational Health - Occupational Stress Questionnaire    Feeling of Stress: Only a little  Social Connections: Moderately Integrated (07/11/2024)   Social Connection and Isolation Panel    Frequency of Communication with Friends and Family: More than three times a week    Frequency of Social Gatherings with Friends and Family: More than three times a week    Attends Religious Services: More than 4 times per year    Active Member of [employer] or Organizations: Yes    Attends Banker Meetings: More than 4  times per year    Marital Status: Widowed  Intimate Partner Violence: Not At Risk (07/11/2024)   Humiliation, Afraid, Rape, and Kick questionnaire    Fear of Current or Ex-Partner: No    Emotionally Abused: No    Physically Abused: No    Sexually Abused: No   Family History  Problem Relation Age of Onset   Colon cancer Brother 30       exposure to chemicals [place]   Hypertension Mother    Stroke Mother    Kidney failure Father    Breast cancer Neg Hx    Ovarian cancer Neg Hx    Current Outpatient Medications on File Prior to Visit  Medication Sig   acetaminophen  (TYLENOL ) 650 MG CR tablet Take 650 mg by mouth every 8 (eight) hours as needed for pain.   apixaban  (ELIQUIS ) 2.5 MG TABS tablet Take 1 tablet (2.5 mg total) by mouth 2 (two) times daily.   Cholecalciferol  (VITAMIN D3) 2000 units capsule Take 2,000 Units by mouth daily.   diphenoxylate -atropine  (LOMOTIL ) 2.5-0.025 MG tablet Take 1 tablet by mouth 4 (four) times daily as needed for diarrhea or loose stools.   fexofenadine (ALLEGRA) 180 MG tablet Take 180 mg by mouth daily.   fluticasone  (FLONASE ) 50 MCG/ACT nasal spray Place 1 spray into both nostrils daily as needed for allergies or rhinitis.   ipratropium (ATROVENT ) 0.03 % nasal spray Place 2 sprays into both nostrils 2 (two) times daily.   magnesium  chloride (SLOW-MAG) 64 MG TBEC SR tablet Take 1 tablet (64 mg  total) by mouth 2 (two) times daily.   metroNIDAZOLE  (METROGEL ) 1 % gel Apply 1 Application topically daily.   Multiple Vitamins-Minerals (ONE-A-DAY WOMENS 50 PLUS PO) Take 1 tablet by mouth daily.    nystatin  (MYCOSTATIN /NYSTOP ) powder Apply 1 Application topically daily.   oxyCODONE  (OXY IR/ROXICODONE ) 5 MG immediate release tablet Take by mouth.   potassium chloride  SA (KLOR-CON  M) 20 MEQ tablet TAKE 1 TABLET BY MOUTH DAILY   simvastatin  (ZOCOR ) 40 MG tablet Take 1 tablet (40 mg total) by mouth at bedtime.   triamterene -hydrochlorothiazide  (DYAZIDE ) 37.5-25 MG capsule TAKE 1 CAPSULE BY MOUTH EACH  DAILY   zinc  gluconate 50 MG tablet Take 50 mg by mouth daily.   Current Facility-Administered Medications on File Prior to Visit  Medication   heparin  lock flush 100 UNIT/ML injection    Review of Systems  Constitutional:  Positive for fatigue. Negative for activity change, appetite change, chills, diaphoresis and fever.  HENT:  Negative for congestion and hearing loss.   Eyes:  Negative for visual disturbance.  Respiratory:  Negative for cough, chest tightness, shortness of breath and wheezing.   Cardiovascular:  Negative for chest pain, palpitations and leg swelling.  Gastrointestinal:  Negative for abdominal pain, constipation, diarrhea, nausea and vomiting.  Genitourinary:  Negative for dysuria, frequency and hematuria.  Musculoskeletal:  Positive for arthralgias, joint swelling and neck pain.  Skin:  Negative for rash.  Neurological:  Negative for dizziness, weakness, light-headedness, numbness and headaches.  Hematological:  Negative for adenopathy.  Psychiatric/Behavioral:  Negative for behavioral problems, dysphoric mood and sleep disturbance.    Per HPI unless specifically indicated above     Objective:    BP 120/68 (BP Location: Left Arm, Patient Position: Sitting, Cuff Size: Normal)   Pulse (!) 101   Ht 5' 4 (1.626 m)   SpO2 98%   BMI 28.00 kg/m   Wt Readings from  Last 3 Encounters:  07/02/24 163 lb  2 oz (74 kg)  06/26/24 156 lb (70.8 kg)  06/18/24 155 lb 12.8 oz (70.7 kg)    Physical Exam Vitals and nursing note reviewed.  Constitutional:      General: She is not in acute distress.    Appearance: She is well-developed. She is not diaphoretic.     Comments: Well-appearing, uncomfortable with Right shoulder / neck, cooperative  HENT:     Head: Normocephalic and atraumatic.  Eyes:     General:        Right eye: No discharge.        Left eye: No discharge.     Conjunctiva/sclera: Conjunctivae normal.  Neck:     Thyroid : No thyromegaly.     Vascular: No carotid bruit.  Cardiovascular:     Rate and Rhythm: Normal rate and regular rhythm.     Heart sounds: Normal heart sounds. No murmur heard. Pulmonary:     Effort: Pulmonary effort is normal. No respiratory distress.     Breath sounds: No wheezing or rales.  Abdominal:     Comments: Colostomy bag in place  Musculoskeletal:     Cervical back: Normal range of motion and neck supple.     Right lower leg: No edema.     Left lower leg: No edema.     Comments: Wheelchair bound  R clavicle [place] joint swelling and tender to palpation  Unable to lift R arm upper extremity shoulder without assistance due to pain  Lower extremity weakness at baseline  Lymphadenopathy:     Cervical: No cervical adenopathy.  Skin:    General: Skin is warm and dry.     Findings: No erythema or rash.  Neurological:     Mental Status: She is alert and oriented to person, place, and time.  Psychiatric:        Behavior: Behavior normal.     Comments: Well groomed, good eye contact, normal speech and thoughts     Results for orders placed or performed in visit on 07/02/24  POC Covid19/Flu A&B Antigen   Collection Time: 07/02/24  1:48 PM  Result Value Ref Range   Influenza A Antigen, POC Negative Negative   Influenza B Antigen, POC Negative Negative   Covid Antigen, POC Negative Negative   *Note: Due to a  large number of results and/or encounters for the requested time period, some results have not been displayed. A complete set of results can be found in Results Review.      Assessment & Plan:   Problem List Items Addressed This Visit     Proximal humerus fracture (Chronic)   Relevant Orders   DG Shoulder Right   DME Wheelchair manual   Benign hypertension with CKD (chronic kidney disease), stage II   Colostomy in place [facility])   Hyperlipidemia   Metastatic adenocarcinoma (HCC)   Relevant Orders   CEA   DG Shoulder Right   DG Clavicle Right   DME Wheelchair manual   Other Visit Diagnoses       Annual physical exam    -  Primary     Flu vaccine need       Relevant Orders   Flu vaccine HIGH DOSE PF(Fluzone Trivalent) (Completed)     Primary osteoarthritis of left hip       Relevant Medications   oxyCODONE  (OXY IR/ROXICODONE ) 5 MG immediate release tablet   Other Relevant Orders   DME Wheelchair manual     Physical deconditioning  Relevant Orders   DME Wheelchair manual     Chronic right shoulder pain       Relevant Medications   oxyCODONE  (OXY IR/ROXICODONE ) 5 MG immediate release tablet   Other Relevant Orders   DG Shoulder Right   DME Wheelchair manual     Pain of right clavicle       Relevant Orders   DG Clavicle Right        Updated Health Maintenance information Reviewed recent lab results with patient Encouraged improvement to lifestyle with diet and exercise  Metastatic Colon Cancer Anemia  Followed by [facility] now since August 2025  Hyperlipidemia Cholesterol levels slightly elevated but stable. Emphasized maintaining nutrition over strict cholesterol control. - Continue simvastatin  40 mg.  Right shoulder and clavicle pain with history of pathologic fracture due to bone tumor Persistent pain likely due to musculoskeletal strain and previous fracture. Differential includes musculoskeletal pain versus complications from previous tumor. -  Order clavicle and shoulder x-rays at outpatient imaging center. STAT results, ultimately she needs to return to Orthopedics for further management however concern given lack of treatment options available given her comorbid health conditions - Continue extra strength Tylenol . - Consider oxycodone  for severe pain as needed. She has this currently from Oncology  General Health Maintenance Flu shot administered. Labs show vitamin D  and B12 normal. A1c at 5.0, no glucose control issues.  Follow-Up Plan to revisit after x-rays to further assess shoulder and clavicle pain. - Schedule follow-up appointment after x-ray results are available.      MOBILITY ASSESSMENT PLAN  Need Manual wheelchair  DME Supplier =   [facility] Fax [phone]  Item Specifics G-20 width X16WC / Generic Wheelcair anti tipping device 2 per chair WC Foot rest, swing away pair  Patient suffers from metastatic colorectal cancer, osteoarthritis bilateral hips, chronic hip pain, physical deconditioning and weakness from chemotherapy, and history right arm fracture, which impairs their ability to perform daily activities like bathing, dressing, feeding, grooming, and toileting in the home.  A cane, crutch, or walker will not resolve issue with performing activities of daily living due to weakness, instability, and right upper extremity fracture. A wheelchair will allow patient to safely perform daily activities. Patient has a caregiver who can provide assistance to safely propel wheelchair. Length of need Lifetime. Accessories: Foot rests swing away, wheel locks, and anti-tippers.   MRADLs impaired in the home include: - PMD is necessary for patient's mobility to get to the bathroom for routine toilet use. - PMD is necessary for patient's mobility to get to the kitchen to prepare meals - PMD is necessary for patient's mobility to get to the bedroom to dress and sleep   Cane or Walker Patient  cannot use a cane / walker due to her status of weakness in right upper extremity with pain from fracture, instability and poor balance, physical deconditioning. Limiting her ability to properly and safely use these devices.   Manual Wheelchair Patient can use a MWC with assistance of caregiver that can propel WC.   Patient is willing and motivated to use the [facility] device in her home to improve her quality of life.  Manual wheelchair will help improve MRADL function.   See attached order with requested paperwork to be faxed to [facility] after completion.     Orders Placed This Encounter  Procedures   DME Wheelchair manual    Need G-20 x 16 WC, generic. Patient suffers from metastatic colorectal cancer, osteoarthritis bilateral  hips, chronic hip pain, physical deconditioning and weakness from chemotherapy, and history right arm fracture, which impairs their ability to perform daily activities like bathing, dressing, feeding, grooming, and toileting in the home.  A cane, crutch, or walker will not resolve issue with performing activities of daily living due to weakness, instability, and right upper extremity fracture. A wheelchair will allow patient to safely perform daily activities. Patient has a caregiver who can provide assistance to safely propel wheelchair. Length of need Lifetime. Accessories: Foot rests swing away, wheel locks, and anti-tippers.   DG Shoulder Right    Standing Status:   Future    Expiration Date:   07/16/2025    Reason for Exam (SYMPTOM  OR DIAGNOSIS REQUIRED):   acute on chronic Right shoulder pain history of humerus fracture    Preferred imaging location?:   [facility]   DG Clavicle Right    Standing Status:   Future    Expiration Date:   07/16/2025    Reason for Exam (SYMPTOM  OR DIAGNOSIS REQUIRED):   right [place] joint clavicle swelling pain acutely, history of metastatic cancer    Preferred imaging location?:   [facility]   Flu vaccine HIGH DOSE PF(Fluzone  Trivalent)   CEA    No orders of the defined types were placed in this encounter.    Follow up plan: Return if symptoms worsen or fail to improve.  [name], DO [facility] 07/16/2024, 11:29 AM

## 2024-07-16 NOTE — Telephone Encounter (Signed)
 Could you call [facility] - [phone] to confirm they can actually see my orders? For both X-rays clavicle and shoulder?  I called the patient. She confirmed that she went directly to the imaging center after leaving our office. She must have arrived during the lunch hour, and I ordered both X-rays at 1246 pm.   It looks like this phone call was made at 1239. So the orders were not in yet by the time the phone call was made. And it sounds like they left because they were not sure when the orders were going to be placed.   [name], DO [facility] 07/16/2024, 6:10 PM

## 2024-07-16 NOTE — Telephone Encounter (Signed)
 Copied from CRM #[id number]. Topic: Clinical - Request for Lab/Test Order >> Jul 16, 2024 12:39 PM DeAngela L wrote: Reason for CRM: Clavical Xray order needed from [facility] to [facility]   Jessica call back so she can get the message when the orders are in [phone] Fax num [phone]

## 2024-07-16 NOTE — Patient Instructions (Addendum)
 CEA lab today  X-rays R Shoulder / Clavicle at [facility]  Please schedule a Follow-up Appointment to: Return if symptoms worsen or fail to improve.  If you have any other questions or concerns, please feel free to call the office or send a message through MyChart. You may also schedule an earlier appointment if necessary.  Additionally, you may be receiving a survey about your experience at our office within a few days to 1 week by e-mail or mail. We value your feedback.  [name], DO [facility], [place]

## 2024-07-17 NOTE — Telephone Encounter (Signed)
 Spoke with Jacqueline Yoder at Imaging, They have the orders, spoke with Jacqueline Yoder notified her she can go have xray completed.

## 2024-07-18 ENCOUNTER — Telehealth: Payer: Self-pay

## 2024-07-18 ENCOUNTER — Ambulatory Visit: Payer: Self-pay | Admitting: Family Medicine

## 2024-07-18 LAB — CEA: CEA: 34317.3 ng/mL — ABNORMAL HIGH

## 2024-07-18 NOTE — Telephone Encounter (Signed)
 Okay to proceed w/ verbal orders  [name], DO [facility] 07/18/2024, 11:54 AM

## 2024-07-18 NOTE — Telephone Encounter (Signed)
 Spoke to [facility] gave verbal orders

## 2024-07-18 NOTE — Telephone Encounter (Signed)
 Copied from CRM [id number]. Topic: Clinical - Home Health Verbal Orders >> Jul 18, 2024 10:47 AM Rosaria BRAVO wrote: Caller/Agency: [facility] Callback Number: [phone] Service Requested: Occupational Therapy Frequency:   1w4  Eval complete   Any new concerns about the patient? No

## 2024-07-21 DIAGNOSIS — M160 Bilateral primary osteoarthritis of hip: Secondary | ICD-10-CM | POA: Diagnosis not present

## 2024-07-21 DIAGNOSIS — C189 Malignant neoplasm of colon, unspecified: Secondary | ICD-10-CM | POA: Diagnosis not present

## 2024-07-22 ENCOUNTER — Ambulatory Visit: Admitting: Physician Assistant

## 2024-07-22 ENCOUNTER — Telehealth: Payer: Self-pay

## 2024-07-22 ENCOUNTER — Encounter: Admitting: Family Medicine

## 2024-07-22 DIAGNOSIS — C799 Secondary malignant neoplasm of unspecified site: Secondary | ICD-10-CM | POA: Diagnosis not present

## 2024-07-22 DIAGNOSIS — K5903 Drug induced constipation: Secondary | ICD-10-CM | POA: Diagnosis not present

## 2024-07-22 DIAGNOSIS — G893 Neoplasm related pain (acute) (chronic): Secondary | ICD-10-CM | POA: Diagnosis not present

## 2024-07-22 DIAGNOSIS — Z515 Encounter for palliative care: Secondary | ICD-10-CM | POA: Diagnosis not present

## 2024-07-22 DIAGNOSIS — C20 Malignant neoplasm of rectum: Secondary | ICD-10-CM | POA: Diagnosis not present

## 2024-07-22 NOTE — Telephone Encounter (Signed)
 Please call [facility] back - Yes I will agree to be her attending physician on [facility] hosp[facility].   Thank you!  [name], DO [facility] 07/22/2024, 3:06 PM

## 2024-07-22 NOTE — Telephone Encounter (Signed)
 Copied from CRM [id number]. Topic: General - Other >> Jul 22, 2024  2:13 PM Avram MATSU wrote: Reason for CRM: Nidia is calling from [facility] and stated the pt daughter would like the provider to be the attending while the pt is on hospice, agreeing the pt terminal illness with a life expectancy of 6 months or less. Pt is being seen today at 2:45 hospice assessment. Please advise [phone]

## 2024-07-22 NOTE — Telephone Encounter (Signed)
 Spoke to savannah notified Dr Edman will be attending

## 2024-07-23 ENCOUNTER — Telehealth: Payer: Self-pay

## 2024-07-23 ENCOUNTER — Encounter: Payer: Self-pay | Admitting: Family Medicine

## 2024-07-23 DIAGNOSIS — Z515 Encounter for palliative care: Secondary | ICD-10-CM | POA: Insufficient documentation

## 2024-07-23 DIAGNOSIS — C7951 Secondary malignant neoplasm of bone: Secondary | ICD-10-CM

## 2024-07-23 DIAGNOSIS — C799 Secondary malignant neoplasm of unspecified site: Secondary | ICD-10-CM

## 2024-07-23 MED ORDER — GABAPENTIN 100 MG PO CAPS: ORAL_CAPSULE | ORAL | 1 refills | Status: DC

## 2024-07-23 MED ORDER — MORPHINE SULFATE (CONCENTRATE) 20 MG/ML PO SOLN: 15.0000 mg | ORAL | 0 refills | Status: DC | PRN

## 2024-07-23 NOTE — Telephone Encounter (Signed)
 Called Jacqueline Yoder back we discussed case and agreed on switch oxycodone  oral to liquid Roxanol regimen with higher dose. Q 2 hr as needed  Hospice admit order set signed and ready to fax  Discussed her metastatic bone pain likely fracture concerns. Jacqueline Yoder suggested that we could consider Dexamethasone  for bone pain, she will contact hospice provider to find out recommended dosing in this scenario, and I would agree with orders and can sign or they can sign.  I notified Jacqueline Yoder that I will be out of office on Thursday and Friday this week on planned PAL time, and they understand this and will contact hospice provider if any orders are needed over next 4 days, and I am back in office on Monday.  [name], DO [facility] 07/23/2024, 12:10 PM

## 2024-07-23 NOTE — Telephone Encounter (Signed)
 Amy, RN with hospice called [phone]) Jacqueline Yoder is completely out of pain medication she has been taking oxycodone  5 mg 2 every 4 hour around the clock and still having pain. They would like something called in to [facility] [address]

## 2024-07-23 NOTE — Addendum Note (Signed)
 Addended by: EDMAN MARSA PARAS on: 07/23/2024 12:29 PM   Modules accepted: Orders

## 2024-07-23 NOTE — Addendum Note (Signed)
 Addended by: EDMAN MARSA PARAS on: 07/23/2024 12:12 PM   Modules accepted: Orders

## 2024-07-23 NOTE — Telephone Encounter (Signed)
 Called back, added Gabapentin  100mg  titration 100 to 300mg  if needed for some neuropathic pain related to wound. We will see how she tolerates Roxanol and the Prednisone  5mg  daily was already added by [name] with hospice providers. They can start Prednisone  tomorrow. Confirmed pharmacy has orders and will fill  I Spoke to her daughter Jacqueline Yoder and she understands situation and expressed some concern if the opioid therapy makes [name] too sleepy and out of it but we will monitor closely and determine the right balance of symptom management for her.  [name], DO [facility] 07/23/2024, 12:28 PM

## 2024-07-24 ENCOUNTER — Emergency Department

## 2024-07-24 ENCOUNTER — Inpatient Hospital Stay
Admission: EM | Admit: 2024-07-24 | Discharge: 2024-07-29 | DRG: 374 | Disposition: A | Discharge status: Hospice | Attending: Internal Medicine | Admitting: Internal Medicine

## 2024-07-24 DIAGNOSIS — E876 Hypokalemia: Secondary | ICD-10-CM | POA: Diagnosis present

## 2024-07-24 DIAGNOSIS — R109 Unspecified abdominal pain: Secondary | ICD-10-CM | POA: Diagnosis not present

## 2024-07-24 DIAGNOSIS — R627 Adult failure to thrive: Secondary | ICD-10-CM | POA: Diagnosis not present

## 2024-07-24 DIAGNOSIS — C20 Malignant neoplasm of rectum: Secondary | ICD-10-CM | POA: Diagnosis present

## 2024-07-24 DIAGNOSIS — Z9221 Personal history of antineoplastic chemotherapy: Secondary | ICD-10-CM

## 2024-07-24 DIAGNOSIS — S31109S Unspecified open wound of abdominal wall, unspecified quadrant without penetration into peritoneal cavity, sequela: Secondary | ICD-10-CM | POA: Diagnosis not present

## 2024-07-24 DIAGNOSIS — E871 Hypo-osmolality and hyponatremia: Secondary | ICD-10-CM | POA: Diagnosis present

## 2024-07-24 DIAGNOSIS — C7802 Secondary malignant neoplasm of left lung: Secondary | ICD-10-CM | POA: Diagnosis not present

## 2024-07-24 DIAGNOSIS — K5900 Constipation, unspecified: Secondary | ICD-10-CM | POA: Diagnosis not present

## 2024-07-24 DIAGNOSIS — C801 Malignant (primary) neoplasm, unspecified: Secondary | ICD-10-CM | POA: Diagnosis not present

## 2024-07-24 DIAGNOSIS — E8729 Other acidosis: Secondary | ICD-10-CM | POA: Insufficient documentation

## 2024-07-24 DIAGNOSIS — Z923 Personal history of irradiation: Secondary | ICD-10-CM

## 2024-07-24 DIAGNOSIS — Z515 Encounter for palliative care: Secondary | ICD-10-CM | POA: Diagnosis not present

## 2024-07-24 DIAGNOSIS — C784 Secondary malignant neoplasm of small intestine: Principal | ICD-10-CM | POA: Diagnosis present

## 2024-07-24 DIAGNOSIS — C7801 Secondary malignant neoplasm of right lung: Secondary | ICD-10-CM | POA: Diagnosis present

## 2024-07-24 DIAGNOSIS — J982 Interstitial emphysema: Secondary | ICD-10-CM | POA: Diagnosis not present

## 2024-07-24 DIAGNOSIS — Z66 Do not resuscitate: Secondary | ICD-10-CM | POA: Diagnosis present

## 2024-07-24 DIAGNOSIS — R918 Other nonspecific abnormal finding of lung field: Secondary | ICD-10-CM | POA: Diagnosis not present

## 2024-07-24 DIAGNOSIS — C799 Secondary malignant neoplasm of unspecified site: Secondary | ICD-10-CM

## 2024-07-24 DIAGNOSIS — C7800 Secondary malignant neoplasm of unspecified lung: Secondary | ICD-10-CM | POA: Diagnosis not present

## 2024-07-24 DIAGNOSIS — E872 Acidosis, unspecified: Secondary | ICD-10-CM | POA: Diagnosis present

## 2024-07-24 DIAGNOSIS — Z7901 Long term (current) use of anticoagulants: Secondary | ICD-10-CM

## 2024-07-24 DIAGNOSIS — E785 Hyperlipidemia, unspecified: Secondary | ICD-10-CM | POA: Diagnosis not present

## 2024-07-24 DIAGNOSIS — Z933 Colostomy status: Secondary | ICD-10-CM | POA: Diagnosis not present

## 2024-07-24 DIAGNOSIS — N3001 Acute cystitis with hematuria: Secondary | ICD-10-CM | POA: Diagnosis present

## 2024-07-24 DIAGNOSIS — N3090 Cystitis, unspecified without hematuria: Secondary | ICD-10-CM | POA: Insufficient documentation

## 2024-07-24 DIAGNOSIS — E43 Unspecified severe protein-calorie malnutrition: Secondary | ICD-10-CM | POA: Diagnosis present

## 2024-07-24 DIAGNOSIS — C189 Malignant neoplasm of colon, unspecified: Secondary | ICD-10-CM | POA: Diagnosis not present

## 2024-07-24 DIAGNOSIS — R000 Tachycardia, unspecified: Secondary | ICD-10-CM | POA: Diagnosis not present

## 2024-07-24 DIAGNOSIS — L89152 Pressure ulcer of sacral region, stage 2: Secondary | ICD-10-CM | POA: Diagnosis not present

## 2024-07-24 DIAGNOSIS — C787 Secondary malignant neoplasm of liver and intrahepatic bile duct: Secondary | ICD-10-CM | POA: Diagnosis not present

## 2024-07-24 DIAGNOSIS — K223 Perforation of esophagus: Secondary | ICD-10-CM | POA: Diagnosis present

## 2024-07-24 DIAGNOSIS — E7800 Pure hypercholesterolemia, unspecified: Secondary | ICD-10-CM | POA: Diagnosis not present

## 2024-07-24 DIAGNOSIS — Z8249 Family history of ischemic heart disease and other diseases of the circulatory system: Secondary | ICD-10-CM

## 2024-07-24 DIAGNOSIS — G928 Other toxic encephalopathy: Secondary | ICD-10-CM | POA: Diagnosis not present

## 2024-07-24 DIAGNOSIS — S31109A Unspecified open wound of abdominal wall, unspecified quadrant without penetration into peritoneal cavity, initial encounter: Secondary | ICD-10-CM | POA: Insufficient documentation

## 2024-07-24 DIAGNOSIS — Z87891 Personal history of nicotine dependence: Secondary | ICD-10-CM

## 2024-07-24 DIAGNOSIS — Z6827 Body mass index (BMI) 27.0-27.9, adult: Secondary | ICD-10-CM

## 2024-07-24 DIAGNOSIS — K56609 Unspecified intestinal obstruction, unspecified as to partial versus complete obstruction: Secondary | ICD-10-CM | POA: Diagnosis not present

## 2024-07-24 DIAGNOSIS — Z743 Need for continuous supervision: Secondary | ICD-10-CM | POA: Diagnosis not present

## 2024-07-24 DIAGNOSIS — K5669 Other partial intestinal obstruction: Secondary | ICD-10-CM | POA: Diagnosis not present

## 2024-07-24 DIAGNOSIS — R590 Localized enlarged lymph nodes: Secondary | ICD-10-CM | POA: Diagnosis not present

## 2024-07-24 DIAGNOSIS — Z86718 Personal history of other venous thrombosis and embolism: Secondary | ICD-10-CM

## 2024-07-24 DIAGNOSIS — R52 Pain, unspecified: Secondary | ICD-10-CM

## 2024-07-24 DIAGNOSIS — C7951 Secondary malignant neoplasm of bone: Secondary | ICD-10-CM | POA: Diagnosis present

## 2024-07-24 DIAGNOSIS — Z881 Allergy status to other antibiotic agents status: Secondary | ICD-10-CM

## 2024-07-24 DIAGNOSIS — R6889 Other general symptoms and signs: Secondary | ICD-10-CM | POA: Diagnosis not present

## 2024-07-24 DIAGNOSIS — E875 Hyperkalemia: Secondary | ICD-10-CM | POA: Diagnosis not present

## 2024-07-24 DIAGNOSIS — M25511 Pain in right shoulder: Secondary | ICD-10-CM | POA: Diagnosis present

## 2024-07-24 DIAGNOSIS — Z7401 Bed confinement status: Secondary | ICD-10-CM | POA: Diagnosis not present

## 2024-07-24 DIAGNOSIS — I10 Essential (primary) hypertension: Secondary | ICD-10-CM | POA: Diagnosis present

## 2024-07-24 DIAGNOSIS — Z86711 Personal history of pulmonary embolism: Secondary | ICD-10-CM

## 2024-07-24 LAB — CBC WITH DIFFERENTIAL/PLATELET
Abs Immature Granulocytes: 0.03 K/uL (ref 0.00–0.07)
Basophils Absolute: 0 K/uL (ref 0.0–0.1)
Basophils Relative: 0 %
Eosinophils Absolute: 0 K/uL (ref 0.0–0.5)
Eosinophils Relative: 0 %
HCT: 35.9 % — ABNORMAL LOW (ref 36.0–46.0)
Hemoglobin: 12.1 g/dL (ref 12.0–15.0)
Immature Granulocytes: 1 %
Lymphocytes Relative: 10 %
Lymphs Abs: 0.6 K/uL — ABNORMAL LOW (ref 0.7–4.0)
MCH: 30.2 pg (ref 26.0–34.0)
MCHC: 33.7 g/dL (ref 30.0–36.0)
MCV: 89.5 fL (ref 80.0–100.0)
Monocytes Absolute: 0.5 K/uL (ref 0.1–1.0)
Monocytes Relative: 9 %
Neutro Abs: 4.9 K/uL (ref 1.7–7.7)
Neutrophils Relative %: 80 %
Platelets: 244 K/uL (ref 150–400)
RBC: 4.01 MIL/uL (ref 3.87–5.11)
RDW: 13.7 % (ref 11.5–15.5)
WBC: 6.1 K/uL (ref 4.0–10.5)
nRBC: 0 % (ref 0.0–0.2)

## 2024-07-24 LAB — COMPREHENSIVE METABOLIC PANEL WITH GFR
ALT: 42 U/L (ref 0–44)
AST: 99 U/L — ABNORMAL HIGH (ref 15–41)
Albumin: 2.7 g/dL — ABNORMAL LOW (ref 3.5–5.0)
Alkaline Phosphatase: 107 U/L (ref 38–126)
Anion gap: 13 (ref 5–15)
BUN: 18 mg/dL (ref 8–23)
CO2: 25 mmol/L (ref 22–32)
Calcium: 9.3 mg/dL (ref 8.9–10.3)
Chloride: 87 mmol/L — ABNORMAL LOW (ref 98–111)
Creatinine, Ser: 0.62 mg/dL (ref 0.44–1.00)
GFR, Estimated: >60 mL/min (ref >60)
Glucose, Bld: 118 mg/dL — ABNORMAL HIGH (ref 70–99)
Potassium: 5.3 mmol/L — ABNORMAL HIGH (ref 3.5–5.1)
Sodium: 125 mmol/L — ABNORMAL LOW (ref 135–145)
Total Bilirubin: 0.8 mg/dL (ref 0.0–1.2)
Total Protein: 6.8 g/dL (ref 6.5–8.1)

## 2024-07-24 LAB — URINALYSIS, W/ REFLEX TO CULTURE (INFECTION SUSPECTED)
Bilirubin Urine: NEGATIVE
Glucose, UA: NEGATIVE mg/dL
Ketones, ur: NEGATIVE mg/dL
Nitrite: NEGATIVE
Protein, ur: 100 mg/dL — AB
RBC / HPF: >50 RBC/hpf (ref 0–5)
Specific Gravity, Urine: 1.025 (ref 1.005–1.030)
pH: 5 (ref 5.0–8.0)

## 2024-07-24 LAB — LACTIC ACID, PLASMA
Lactic Acid, Venous: 2 mmol/L (ref 0.5–1.9)
Lactic Acid, Venous: 2.1 mmol/L (ref 0.5–1.9)

## 2024-07-24 LAB — PROTIME-INR
INR: 1.3 — ABNORMAL HIGH (ref 0.8–1.2)
Prothrombin Time: 16.8 s — ABNORMAL HIGH (ref 11.4–15.2)

## 2024-07-24 MED ORDER — ONDANSETRON HCL 4 MG/2ML IJ SOLN
4.0000 mg | Freq: Once | INTRAMUSCULAR | Status: AC
  Administered 2024-07-24: 4 mg via INTRAVENOUS
  Filled 2024-07-24: qty 2

## 2024-07-24 MED ORDER — IOHEXOL 300 MG/ML  SOLN
80.0000 mL | Freq: Once | INTRAMUSCULAR | Status: AC | PRN
Start: 2024-07-24 — End: 2024-07-24
  Administered 2024-07-24: 80 mL via INTRAVENOUS

## 2024-07-24 MED ORDER — LACTATED RINGERS IV BOLUS
1000.0000 mL | Freq: Once | INTRAVENOUS | Status: AC
  Administered 2024-07-24: 1000 mL via INTRAVENOUS

## 2024-07-24 MED ORDER — LIDOCAINE HCL URETHRAL/MUCOSAL 2 % EX GEL
1.0000 | Freq: Once | CUTANEOUS | Status: DC
  Filled 2024-07-24: qty 10

## 2024-07-24 MED ORDER — KCL IN DEXTROSE-NACL 10-5-0.45 MEQ/L-%-% IV SOLN
INTRAVENOUS | Status: DC
  Filled 2024-07-24: qty 1000

## 2024-07-24 MED ORDER — PIPERACILLIN-TAZOBACTAM 3.375 G IVPB 30 MIN
3.3750 g | Freq: Once | INTRAVENOUS | Status: AC
  Administered 2024-07-24: 3.375 g via INTRAVENOUS
  Filled 2024-07-24: qty 50

## 2024-07-24 MED ORDER — LIDOCAINE HCL (PF) 1 % IJ SOLN: 5.0000 mL | Freq: Once | INTRAMUSCULAR | Status: DC

## 2024-07-24 NOTE — ED Notes (Addendum)
 Per previous shift RN they did not attempt to place NG tube due to pts possible esophageal perforation. Provider in room speaking with patients family about possible options.   This RN in room and pt denies any needs at this time.

## 2024-07-24 NOTE — ED Provider Notes (Signed)
 Clinical Course as of 07/24/24 2304  Thu Jul 24, 2024  1610 Patient admitted to hospitalist before my shift by previous physician. Patient DNR/DNI. Has a SBO with transition point. Had an order for NGT insertion.  However nursing noticed that there was evidence of esophageal perforation with pneumomediastinum also on CT scan.  Nursing questioning inserting NGT which I think is appropriate.  Will hold off inserting NG tube at this time; will await hospitalist evaluation and determine whether goals of care are consistent with obtaining subspecialist evaluation [HD]  1835 Case discussed with Dr. Deretha hospitalist and we do not have the ability with IR or GI to do NG tube administration.  Family is deciding whether they want any intervention versus comfort measures [HD]  1842 Family still discussing what they want to do request another period of time [HD]  1908 Patient request transfer for intervention and states that she would revoke the DNR DNI.  Will update the hospitalist and talk to cardiothoracic surgery at [facility] [HD]  8062 Case discussed with Dr. Veda CT surgeon at [facility] [HD]  8046 Dr. Daniel of [facility] CT surgeon states that patient should go to [facility] if possible given previous history.  [facility] transfer center reached out.  Family updated and agreeable [HD]  2018 [facility] transfer center contacted and will let me know [HD]  2056 Case discussed with CT surgeon Donnice Crosby at [facility].  He states that the likelihood of esophageal perforation is small but inserting an NG tube would be of little utility unless the small bowel obstruction is addressed with a transition point.  He states that if patient is unwilling to obtain surgery will general surgery, there would be no point in figuring out the esophageal perforation.  Will consult general surgery [HD]  2103 Dr. Jordis is generously willing to talk to the patient [HD]  2127 Dr. Jordis has explained the situation to the patients family and all in  agreement at this time to make the patient comfort care no NG tube necessary [HD]  2304 Hospitalist reconsulted for admission [HD]    Clinical Course User Index [HD] Nicholaus Rolland BRAVO, MD   .Critical Care  Performed by: Nicholaus Rolland BRAVO, MD Authorized by: Nicholaus Rolland BRAVO, MD   Critical care provider statement:    Critical care time (minutes):  30   Critical care was necessary to treat or prevent imminent or life-threatening deterioration of the following conditions:  Shock   Critical care was time spent personally by me on the following activities:  Development of treatment plan with patient or surrogate, discussions with consultants, evaluation of patient's response to treatment, examination of patient, ordering and review of laboratory studies, ordering and review of radiographic studies, ordering and performing treatments and interventions, pulse oximetry, re-evaluation of patient's condition and review of old charts   Care discussed with: admitting provider   Comments:     Figuring out goals of care with talking to multiple transfer centers send multiple consultants with multiple reassessments     Nicholaus Rolland BRAVO, MD 07/24/24 2304

## 2024-07-24 NOTE — Progress Notes (Signed)
 [facility] Room ED 11 [facility] Liaison Note  This patient is currently followed by [facility].  [facility] will follow through discharge disposition.  Please call with any hospice related questions or concerns.  [facility] Liaison [phone]

## 2024-07-24 NOTE — ED Notes (Signed)
 MD at bedside.

## 2024-07-24 NOTE — ED Notes (Signed)
 Pt gone to CT

## 2024-07-24 NOTE — ED Triage Notes (Signed)
 Pt to ED via [facility] from home c/o of abdominal pain x2 days. Reports no BM since last Tuesday, 8/30. Pt has a colostomy. Hx of metastatic colon cancer. Pt is on Hospice. Family reports that oxycodone  dose change from 0.5 tablet to 2 tablets Q4. Also reports Pt became confused with new medication dose Has not taken any today. Family also reports dark urine with a strong smell. AxO4.   100.4 axillary 105/76 RR 24 120 HR 98 O2 RA

## 2024-07-24 NOTE — ED Provider Notes (Addendum)
 [facility] Provider Note    Event Date/Time   First MD Initiated Contact with Patient 07/24/24 1303     (approximate)   History   Chief Complaint: Abdominal Pain   HPI  Jacqueline Yoder is a 78 y.o. female with a history of GERD, hypertension, rectal cancer with metastatic disease status post colostomy who comes ED complaining of generalized abdominal pain for last 2 days, nausea, vomiting.  Minimal stool output in the colostomy during this time, no gas output in the bag.  No fever.  Symptoms started after patient was transition to an increased oxycodone  dose 3 days ago.  Family also noted dark malodorous urine         Past Medical History:  Diagnosis Date   Allergy    Arthritis    Blood clot in vein    Family history of colon cancer    GERD (gastroesophageal reflux disease)    Hypercholesteremia    Hypertension    Hypertension    Lower extremity edema    Personal history of chemotherapy    Rectal cancer (HCC) 12/2018   Urinary incontinence     Current Outpatient Rx   Order #: [id number] Class: Historical Med   Order #: [id number] Class: Normal   Order #: [id number] Class: Historical Med   Order #: [id number] Class: Normal   Order #: [id number] Class: Historical Med   Order #: [id number] Class: No Print   Order #: [id number] Class: Normal   Order #: [id number] Class: Historical Med   Order #: [id number] Class: No Print   Order #: [id number] Class: Historical Med   Order #: [id number] Class: Normal   Order #: [id number] Class: Historical Med   Order #: [id number] Class: Historical Med   Order #: [id number] Class: Normal   Order #: [id number] Class: Normal   Order #: [id number] Class: Normal   Order #: [id number] Class: Historical Med    Past Surgical History:  Procedure Laterality Date   ABDOMINAL HYSTERECTOMY     CHOLECYSTECTOMY  1971   COLONOSCOPY WITH PROPOFOL  N/A 12/03/2018   Procedure: COLONOSCOPY WITH PROPOFOL ;  Surgeon: Jinny Carmine, MD;  Location: [facility]  ENDOSCOPY;  Service: Endoscopy;  Laterality: N/A;   COLONOSCOPY WITH PROPOFOL  N/A 07/15/2020   Procedure: COLONOSCOPY WITH PROPOFOL ;  Surgeon: Therisa Bi, MD;  Location: [facility] ENDOSCOPY;  Service: Gastroenterology;  Laterality: N/A;   FLEXIBLE SIGMOIDOSCOPY N/A 12/06/2018   Procedure: FLEXIBLE SIGMOIDOSCOPY;  Surgeon: Therisa Bi, MD;  Location: [facility] ENDOSCOPY;  Service: Endoscopy;  Laterality: N/A;   LAPAROSCOPIC COLOSTOMY  01/06/2019   PORTACATH PLACEMENT N/A 04/03/2019   Procedure: INSERTION PORT-A-CATH;  Surgeon: Jordis Laneta JULIANNA, MD;  Location: [facility] ORS;  Service: General;  Laterality: N/A;    Physical Exam   Triage Vital Signs: ED Triage Vitals  Encounter Vitals Group     BP --      Girls Systolic BP Percentile --      Girls Diastolic BP Percentile --      Boys Systolic BP Percentile --      Boys Diastolic BP Percentile --      Pulse --      Resp --      Temp --      Temp src --      SpO2 --      Weight 07/24/24 1230 161 lb (73 kg)     Height 07/24/24 1230 5' 4 (1.626 m)     Head Circumference --      Peak Flow --      Pain Score 07/24/24 1229 5  Pain Loc --      Pain Education --      Exclude from Growth Chart --     Most recent vital signs: Vitals:   07/24/24 1530 07/24/24 1545  BP: 118/62   Pulse: (!) 115   Resp: (!) 27   Temp:  98.6 F (37 C)  SpO2: 99%     General: Awake, no distress.  Chronically ill-appearing CV:  Good peripheral perfusion.  Tachycardia heart rate 120 Resp:  Normal effort.  Clear lungs Abd:  Mild distention.  Soft, no focal tenderness.  Tympany to percussion. Other:  Dry oral mucosa   ED Results / Procedures / Treatments   Labs (all labs ordered are listed, but only abnormal results are displayed) Labs Reviewed  LACTIC ACID, PLASMA - Abnormal; Notable for the following components:      Result Value   Lactic Acid, Venous 2.0 (*)    All other components within normal limits  COMPREHENSIVE METABOLIC PANEL WITH GFR - Abnormal;  Notable for the following components:   Sodium 125 (*)    Potassium 5.3 (*)    Chloride 87 (*)    Glucose, Bld 118 (*)    Albumin 2.7 (*)    AST 99 (*)    All other components within normal limits  CBC WITH DIFFERENTIAL/PLATELET - Abnormal; Notable for the following components:   HCT 35.9 (*)    Lymphs Abs 0.6 (*)    All other components within normal limits  URINALYSIS, W/ REFLEX TO CULTURE (INFECTION SUSPECTED) - Abnormal; Notable for the following components:   Color, Urine YELLOW (*)    APPearance CLOUDY (*)    Hgb urine dipstick LARGE (*)    Protein, ur 100 (*)    Leukocytes,Ua MODERATE (*)    Bacteria, UA RARE (*)    All other components within normal limits  PROTIME-INR - Abnormal; Notable for the following components:   Prothrombin Time 16.8 (*)    INR 1.3 (*)    All other components within normal limits  CULTURE, BLOOD (ROUTINE X 2)  CULTURE, BLOOD (ROUTINE X 2)  RESP PANEL BY RT-PCR (RSV, FLU A&B, COVID)  [id number]  URINE CULTURE  LACTIC ACID, PLASMA     EKG Interpreted by me Sinus tachycardia rate 113.  Left axis, normal intervals.  Poor R wave progression.  No acute ischemic changes.   RADIOLOGY Chest x-ray interpreted by me, no consolidation, no pneumothorax.  Radiology report reviewed   PROCEDURES:  Procedures   MEDICATIONS ORDERED IN ED: Medications  piperacillin-tazobactam (ZOSYN) IVPB 3.375 g (has no administration in time range)  lidocaine  (XYLOCAINE ) 2 % jelly 1 Application (has no administration in time range)  lactated ringers  bolus 1,000 mL (1,000 mLs Intravenous New Bag/Given 07/24/24 1336)  ondansetron  (ZOFRAN ) injection 4 mg (4 mg Intravenous Given 07/24/24 1338)  iohexol  (OMNIPAQUE ) 300 MG/ML solution 80 mL (80 mLs Intravenous Contrast Given 07/24/24 1358)     IMPRESSION / MDM / ASSESSMENT AND PLAN / ED COURSE  I reviewed the triage vital signs and the nursing notes.  DDx: Bowel obstruction, bowel perforation, gastritis, dehydration, AKI,  electrolyte derangement, UTI, pneumonia  Patient's presentation is most consistent with acute presentation with potential threat to life or bodily function.  Patient presents with p.o. intolerance, nausea vomiting for the last 2 days, decreased stool output.  Exam concerning for bowel obstruction.  She appears dehydrated and tachycardic.  Labs reveal hyponatremia as well as cystitis.  CT confirms small bowel obstruction.  Discussed  with radiologist who also notes small pneumomediastinum without any mediastinal fluid or inflammatory changes.  Discussed these findings and usual medical care with the patient and family in light of her recent transition to hospice.  Patient is agreeable to NG tube placement, IV antibiotics, hospitalization.   ----------------------------------------- 3:49 PM on 07/24/2024 ----------------------------------------- Case discussed with hospitalist     FINAL CLINICAL IMPRESSION(S) / ED DIAGNOSES   Final diagnoses:  SBO (small bowel obstruction) (HCC)  Cystitis  Metastatic malignant neoplasm, unspecified site (HCC)  Pneumomediastinum (HCC)     Rx / DC Orders   ED Discharge Orders     None        Note:  This document was prepared using Dragon voice recognition software and may include unintentional dictation errors.   Viviann Pastor, MD 07/24/24 1530    Viviann Pastor, MD 07/24/24 [phone]

## 2024-07-24 NOTE — ED Notes (Signed)
 Pt reports that she is thirsty and wants something to drink. This RN spoke with Dr. Nicholaus who reported it was okay for pt to eat and drink anything as she is comfort care now. Reports that pt should eat or drink in small amounts.  This RN made pt hot tea with honey. Pt and family appreciative. Pt denies any pain.

## 2024-07-25 ENCOUNTER — Other Ambulatory Visit: Payer: Self-pay

## 2024-07-25 DIAGNOSIS — C7951 Secondary malignant neoplasm of bone: Secondary | ICD-10-CM | POA: Diagnosis present

## 2024-07-25 DIAGNOSIS — E875 Hyperkalemia: Secondary | ICD-10-CM | POA: Diagnosis present

## 2024-07-25 DIAGNOSIS — K56609 Unspecified intestinal obstruction, unspecified as to partial versus complete obstruction: Secondary | ICD-10-CM

## 2024-07-25 DIAGNOSIS — I10 Essential (primary) hypertension: Secondary | ICD-10-CM | POA: Diagnosis present

## 2024-07-25 DIAGNOSIS — N3001 Acute cystitis with hematuria: Secondary | ICD-10-CM | POA: Diagnosis present

## 2024-07-25 DIAGNOSIS — Z8249 Family history of ischemic heart disease and other diseases of the circulatory system: Secondary | ICD-10-CM | POA: Diagnosis not present

## 2024-07-25 DIAGNOSIS — K223 Perforation of esophagus: Secondary | ICD-10-CM | POA: Diagnosis present

## 2024-07-25 DIAGNOSIS — C784 Secondary malignant neoplasm of small intestine: Secondary | ICD-10-CM | POA: Diagnosis present

## 2024-07-25 DIAGNOSIS — C787 Secondary malignant neoplasm of liver and intrahepatic bile duct: Secondary | ICD-10-CM | POA: Diagnosis present

## 2024-07-25 DIAGNOSIS — J982 Interstitial emphysema: Secondary | ICD-10-CM | POA: Diagnosis present

## 2024-07-25 DIAGNOSIS — Z515 Encounter for palliative care: Secondary | ICD-10-CM | POA: Diagnosis not present

## 2024-07-25 DIAGNOSIS — C7802 Secondary malignant neoplasm of left lung: Secondary | ICD-10-CM | POA: Diagnosis present

## 2024-07-25 DIAGNOSIS — C189 Malignant neoplasm of colon, unspecified: Secondary | ICD-10-CM

## 2024-07-25 DIAGNOSIS — R627 Adult failure to thrive: Secondary | ICD-10-CM | POA: Diagnosis present

## 2024-07-25 DIAGNOSIS — C20 Malignant neoplasm of rectum: Secondary | ICD-10-CM | POA: Diagnosis present

## 2024-07-25 DIAGNOSIS — Z7901 Long term (current) use of anticoagulants: Secondary | ICD-10-CM | POA: Diagnosis not present

## 2024-07-25 DIAGNOSIS — E872 Acidosis, unspecified: Secondary | ICD-10-CM

## 2024-07-25 DIAGNOSIS — G928 Other toxic encephalopathy: Secondary | ICD-10-CM | POA: Diagnosis present

## 2024-07-25 DIAGNOSIS — E871 Hypo-osmolality and hyponatremia: Secondary | ICD-10-CM

## 2024-07-25 DIAGNOSIS — E876 Hypokalemia: Secondary | ICD-10-CM | POA: Diagnosis present

## 2024-07-25 DIAGNOSIS — C7801 Secondary malignant neoplasm of right lung: Secondary | ICD-10-CM | POA: Diagnosis present

## 2024-07-25 DIAGNOSIS — L89152 Pressure ulcer of sacral region, stage 2: Secondary | ICD-10-CM

## 2024-07-25 DIAGNOSIS — Z66 Do not resuscitate: Secondary | ICD-10-CM | POA: Diagnosis present

## 2024-07-25 DIAGNOSIS — E43 Unspecified severe protein-calorie malnutrition: Secondary | ICD-10-CM | POA: Diagnosis present

## 2024-07-25 DIAGNOSIS — E7800 Pure hypercholesterolemia, unspecified: Secondary | ICD-10-CM | POA: Diagnosis present

## 2024-07-25 DIAGNOSIS — K5669 Other partial intestinal obstruction: Secondary | ICD-10-CM | POA: Diagnosis present

## 2024-07-25 DIAGNOSIS — C7800 Secondary malignant neoplasm of unspecified lung: Secondary | ICD-10-CM

## 2024-07-25 DIAGNOSIS — E785 Hyperlipidemia, unspecified: Secondary | ICD-10-CM

## 2024-07-25 LAB — COMPREHENSIVE METABOLIC PANEL WITH GFR
ALT: 37 U/L (ref 0–44)
AST: 64 U/L — ABNORMAL HIGH (ref 15–41)
Albumin: 2.3 g/dL — ABNORMAL LOW (ref 3.5–5.0)
Alkaline Phosphatase: 96 U/L (ref 38–126)
Anion gap: 14 (ref 5–15)
BUN: 18 mg/dL (ref 8–23)
CO2: 25 mmol/L (ref 22–32)
Calcium: 8.9 mg/dL (ref 8.9–10.3)
Chloride: 88 mmol/L — ABNORMAL LOW (ref 98–111)
Creatinine, Ser: 0.67 mg/dL (ref 0.44–1.00)
GFR, Estimated: >60 mL/min (ref >60)
Glucose, Bld: 149 mg/dL — ABNORMAL HIGH (ref 70–99)
Potassium: 4.5 mmol/L (ref 3.5–5.1)
Sodium: 127 mmol/L — ABNORMAL LOW (ref 135–145)
Total Bilirubin: 0.5 mg/dL (ref 0.0–1.2)
Total Protein: 6 g/dL — ABNORMAL LOW (ref 6.5–8.1)

## 2024-07-25 LAB — URINE CULTURE: Culture: >=100000 — AB

## 2024-07-25 LAB — LACTIC ACID, PLASMA: Lactic Acid, Venous: 1.3 mmol/L (ref 0.5–1.9)

## 2024-07-25 MED ORDER — ACETAMINOPHEN 325 MG PO TABS: 650.0000 mg | ORAL_TABLET | Freq: Four times a day (QID) | ORAL | Status: DC | PRN

## 2024-07-25 MED ORDER — LACTATED RINGERS IV SOLN: INTRAVENOUS | Status: DC

## 2024-07-25 MED ORDER — ENOXAPARIN SODIUM 40 MG/0.4ML IJ SOSY
40.0000 mg | PREFILLED_SYRINGE | INTRAMUSCULAR | Status: DC
  Administered 2024-07-25 – 2024-07-29 (×5): 40 mg via SUBCUTANEOUS
  Filled 2024-07-25 (×5): qty 0.4

## 2024-07-25 MED ORDER — MORPHINE SULFATE (PF) 2 MG/ML IV SOLN
2.0000 mg | INTRAVENOUS | Status: DC | PRN
  Administered 2024-07-25: 1 mg via INTRAVENOUS
  Administered 2024-07-25 – 2024-07-29 (×17): 2 mg via INTRAVENOUS
  Filled 2024-07-25 (×18): qty 1

## 2024-07-25 MED ORDER — LACTATED RINGERS IV SOLN: INTRAVENOUS | Status: AC

## 2024-07-25 MED ORDER — ONDANSETRON HCL 4 MG PO TABS: 4.0000 mg | ORAL_TABLET | Freq: Four times a day (QID) | ORAL | Status: DC | PRN

## 2024-07-25 MED ORDER — SODIUM CHLORIDE 0.9 % IV SOLN
12.5000 mg | Freq: Four times a day (QID) | INTRAVENOUS | Status: DC | PRN
  Filled 2024-07-25: qty 0.5

## 2024-07-25 MED ORDER — PIPERACILLIN-TAZOBACTAM 3.375 G IVPB
3.3750 g | Freq: Three times a day (TID) | INTRAVENOUS | Status: DC
  Administered 2024-07-25 – 2024-07-29 (×14): 3.375 g via INTRAVENOUS
  Filled 2024-07-25 (×14): qty 50

## 2024-07-25 MED ORDER — ONDANSETRON HCL 4 MG/2ML IJ SOLN: 4.0000 mg | Freq: Four times a day (QID) | INTRAMUSCULAR | Status: DC | PRN

## 2024-07-25 MED ORDER — ACETAMINOPHEN 650 MG RE SUPP: 650.0000 mg | Freq: Four times a day (QID) | RECTAL | Status: DC | PRN

## 2024-07-25 NOTE — ED Notes (Signed)
 Family updated as to patient's status.

## 2024-07-25 NOTE — Hospital Course (Addendum)
 78 y.o. female with medical history significant for  HTN, HLD, and rectal cancer with lung and bony metastases admitted for SBO in July 2025 which resolved spontaneously being admitted with SBO with concern for esophageal perforation with pneumomediastinum on CT.  Transfer was attempted to [facility] however CT surgery recommended transfer to [facility], but [facility] said that surgery should be consulted to address the SBO.  (Please see ED provider's note ).  Surgeon, Dr. Jordis was ultimately consulted.  After discussion with family it was reported that patient was placed comfort care and hospitalist consulted for admission.  In speaking with patient as well as daughter and POA at bedside along with another daughter and son also at bedside, family stated that patient is DNR/DNI and does not want surgery but they are interested in conservative treatment and after explaining comfort care, they are not yet ready for comfort care. In the ED patient remained tachycardic and tachypneic but afebrile. WBC was normal with lactic acid 2.1, electrolyte abnormalities of hyponatremia and mild hyperkalemia and normal creatinine.  Urinalysis was consistent with UTI. CT abdomen and pelvis showing SBO with possible small esophageal perforation with pneumoperitoneum Patient was started on Zosyn, LR Patient being admitted to the hospitalist service Family is open to a palliative care consult   10/10.  General surgery Dr. Jordis spoke with family about not doing surgery.  Patient not in any pain or having any nausea.  Admitted with bowel obstruction and esophageal perforation.  Hospice consulted. 10/11.  Patient having some abdominal pain.  Still no gas passed. 10/12.  Patient had bowel movement through her ostomy.  Will start on liquid diet. 10/13.  Will advance to full liquid diet 10/14.  Patient had a lot of pain this morning after being moving around and cleaned up.  Had to adjust pain medications.  Family has changed their mind  and wanted to go to hospice facility instead of home with hospice.  They have availability at the hospice facility today and will discharge there today.

## 2024-07-25 NOTE — ED Notes (Signed)
 Offered to turn patient to prevent bedsores; patient reports she is comfortable in her current position. Family and patient made aware to utilize call bell if anything changes.

## 2024-07-25 NOTE — Plan of Care (Signed)
 Notes and labs reviewed.  Jacqueline Yoder is a patient under [facility] services.  Spoke with hospice liaison who states she will proceed with goals of care conversations at this time, and notify inpatient PMT if our assistance is needed.  PMT will sign off.

## 2024-07-25 NOTE — Progress Notes (Signed)
   07/25/24 1530  Spiritual Encounters  Type of Visit Initial  Care provided to: Family  Reason for visit Routine spiritual support  Interventions  Spiritual Care Interventions Made Established relationship of care and support;Compassionate presence  Intervention Outcomes  Outcomes Connection to spiritual care  Spiritual Care Plan  Spiritual Care Issues Still Outstanding No further spiritual care needs at this time (see row info)   Chaplain met family in the hallway and began a conversation about their believing for pt's healing through prayer.

## 2024-07-25 NOTE — IPAL (Signed)
  Interdisciplinary Goals of Care Family Meeting   Date carried out: 07/25/2024  Location of the meeting: Bedside  Member's involved: Physician and Family Member or next of kin daughter and POA was another daughter and son  Durable Power of Pensions consultant or Environmental health practitioner: Patient and daughter who is HCPOA  Discussion: We discussed goals of care for [employer] .   I have reviewed medical records including EPIC notes, labs and imaging. Discussed major active diagnoses, plan of care, natural trajectory, prognosis, GOC, EOL wishes, disposition and options including Full code/DNI/DNR and the concept of comfort care if DNR is elected. Questions and concerns were addressed.  Election for DNR/DNI status. Family is not yet ready for comfort care but is open to speaking with palliative care in the morning   Code status:   Code Status: Limited: Do not attempt resuscitation (DNR) -DNR-LIMITED -Do Not Intubate/DNI    Disposition: Continue current acute care  Time spent for the meeting: 32    Delayne LULLA Solian, MD  07/25/2024, 3:39 AM

## 2024-07-25 NOTE — Assessment & Plan Note (Signed)
 Holding meds

## 2024-07-25 NOTE — ED Notes (Signed)
 Advised family sitting with pt, pt can only have ice chips, family declined stating pt can not eat ice chips

## 2024-07-25 NOTE — Assessment & Plan Note (Addendum)
 General Surgery spoke with family and decided not to do surgery on 10/10.  Try to advance to soft diet today.  Patient can have what ever diet she chooses whether it is liquid diet or soft diet.  Will discontinue antibiotics since patient going to the hospice facility.

## 2024-07-25 NOTE — ED Notes (Signed)
 Fall risk bundle is currently in place.

## 2024-07-25 NOTE — Consult Note (Addendum)
 Patient ID: Jacqueline Yoder, female   DOB: 1946/02/19, 78 y.o.   MRN: [id number]  HPI Jacqueline Yoder is a 78 y.o. female Jacqueline Yoder is a 78y.o.very nice  female with history of advanced colon cancer status post chemotherapy and radiation followed by abdominoperineal resection 5 years ago or so.  She unfortunately had recurrence with multiple metastatic disease including lung metastases, bone metastases as well as soft tissue metastasis in her skin pannus.    She comes in last night w failure to thrive, nausea, vomiting. She has been progressively worsened.  She did have prior bowel obstructions that were managed conservatively She did have CT of the chest abdomen and pelvis  that I have pers reviewed  showing mediastinal air c/w esophageal perforation and SBO, there is  increased in lymphadenopathy within the retroperitoneal area consistent with metastatic disease she also had multiple pulmonary metastases nodules that have enlarged .  She does have multiple liver lesions consistent with metastatic disease.  She does seems to have also MAtted loops of small bowel in the left lower quadrant and I question metastatic disease . Family and pt DNR but had a lot of questions about hospice, palliative and comofrt.there are two daughter at bedside and her son.  She has seen oncology at [facility] and no many options were given. Not candidate for clinical trial or further chemo.    HPI  Past Medical History:  Diagnosis Date   Allergy    Arthritis    Blood clot in vein    Family history of colon cancer    GERD (gastroesophageal reflux disease)    Hypercholesteremia    Hypertension    Hypertension    Lower extremity edema    Personal history of chemotherapy    Rectal cancer (HCC) 12/2018   Urinary incontinence     Past Surgical History:  Procedure Laterality Date   ABDOMINAL HYSTERECTOMY     CHOLECYSTECTOMY  1971   COLONOSCOPY WITH PROPOFOL  N/A 12/03/2018   Procedure: COLONOSCOPY WITH PROPOFOL ;   Surgeon: Jinny Carmine, MD;  Location: [facility] ENDOSCOPY;  Service: Endoscopy;  Laterality: N/A;   COLONOSCOPY WITH PROPOFOL  N/A 07/15/2020   Procedure: COLONOSCOPY WITH PROPOFOL ;  Surgeon: Therisa Bi, MD;  Location: [facility] ENDOSCOPY;  Service: Gastroenterology;  Laterality: N/A;   FLEXIBLE SIGMOIDOSCOPY N/A 12/06/2018   Procedure: FLEXIBLE SIGMOIDOSCOPY;  Surgeon: Therisa Bi, MD;  Location: [facility] ENDOSCOPY;  Service: Endoscopy;  Laterality: N/A;   LAPAROSCOPIC COLOSTOMY  01/06/2019   PORTACATH PLACEMENT N/A 04/03/2019   Procedure: INSERTION PORT-A-CATH;  Surgeon: Jordis Laneta JULIANNA, MD;  Location: [facility] ORS;  Service: General;  Laterality: N/A;    Family History  Problem Relation Age of Onset   Colon cancer Brother 60       exposure to chemicals [place]   Hypertension Mother    Stroke Mother    Kidney failure Father    Breast cancer Neg Hx    Ovarian cancer Neg Hx     Social History Social History   Tobacco Use   Smoking status: Former    Current packs/day: 0.00    Types: Cigarettes    Quit date: 12/02/1977    Years since quitting: 46.6   Smokeless tobacco: Former  Building services engineer status: Never Used  Substance Use Topics   Alcohol use: Never   Drug use: Never    Allergies  Allergen Reactions   Sulfamethoxazole-Trimethoprim Other (See Comments)    Also said she had chills and pressure in  nose.    Current Facility-Administered Medications  Medication Dose Route Frequency Provider Last Rate Last Admin   acetaminophen  (TYLENOL ) tablet 650 mg  650 mg Oral Q6H PRN Duncan, Hazel V, MD       Or   acetaminophen  (TYLENOL ) suppository 650 mg  650 mg Rectal Q6H PRN Duncan, Hazel V, MD       enoxaparin  (LOVENOX ) injection 40 mg  40 mg Subcutaneous Q24H Duncan, Hazel V, MD   40 mg at 07/25/24 9168   lactated ringers  infusion   Intravenous Continuous Cleatus Delayne GAILS, MD 100 mL/hr at 07/25/24 0503 New Bag at 07/25/24 0503   lidocaine  (XYLOCAINE ) 2 % jelly 1 Application  1 Application  Topical Once Viviann Pastor, MD       morphine  (PF) 2 MG/ML injection 2 mg  2 mg Intravenous Q2H PRN Duncan, Hazel V, MD       ondansetron  (ZOFRAN ) tablet 4 mg  4 mg Oral Q6H PRN Cleatus Delayne GAILS, MD       Or   ondansetron  (ZOFRAN ) injection 4 mg  4 mg Intravenous Q6H PRN Duncan, Hazel V, MD       piperacillin-tazobactam (ZOSYN) IVPB 3.375 g  3.375 g Intravenous Q8H Cleatus Delayne GAILS, MD   Stopped at 07/25/24 0827   promethazine  (PHENERGAN ) 12.5 mg in sodium chloride  0.9 % 50 mL IVPB  12.5 mg Intravenous Q6H PRN Duncan, Hazel V, MD       Current Outpatient Medications  Medication Sig Dispense Refill   acetaminophen  (TYLENOL ) 650 MG CR tablet Take 500 mg by mouth every 8 (eight) hours as needed for pain.     apixaban  (ELIQUIS ) 2.5 MG TABS tablet Take 1 tablet (2.5 mg total) by mouth 2 (two) times daily. 200 tablet 2   Cholecalciferol  (VITAMIN D3) 2000 units capsule Take 2,000 Units by mouth daily.     Cinnamon 500 MG TABS Take 500 tablets by mouth daily.     fexofenadine (ALLEGRA) 180 MG tablet Take 180 mg by mouth daily.     fluticasone  (FLONASE ) 50 MCG/ACT nasal spray Place 1 spray into both nostrils daily as needed for allergies or rhinitis.     gabapentin  (NEURONTIN ) 100 MG capsule Start 1 capsule daily at bedtime and increase by 1 cap every 2-3 days as tolerated up to max dose 3 = 300mg  in evening or 100mg  3 times a day if preferred. 90 capsule 1   magnesium  chloride (SLOW-MAG) 64 MG TBEC SR tablet Take 1 tablet (64 mg total) by mouth 2 (two) times daily. 60 tablet 2   metroNIDAZOLE  (METROGEL ) 1 % gel Apply 1 Application topically daily.     Multiple Vitamins-Minerals (ONE-A-DAY WOMENS 50 PLUS PO) Take 1 tablet by mouth daily.      nystatin  (MYCOSTATIN /NYSTOP ) powder Apply 1 Application topically daily.     ondansetron  (ZOFRAN ) 8 MG tablet Take 8 mg by mouth every 8 (eight) hours as needed for nausea or vomiting.     oxyCODONE  (OXY IR/ROXICODONE ) 5 MG immediate release tablet Take 10 mg by  mouth every 4 (four) hours as needed for severe pain (pain score 7-10).     polyethylene glycol powder (GLYCOLAX/MIRALAX) 17 GM/SCOOP powder Take 17 g by mouth daily.     potassium chloride  SA (KLOR-CON  M) 20 MEQ tablet TAKE 1 TABLET BY MOUTH DAILY 100 tablet 1   senna-docusate (SENOKOT-S) 8.6-50 MG tablet Take 2 tablets by mouth 2 (two) times daily.     simvastatin  (ZOCOR ) 40 MG tablet Take  1 tablet (40 mg total) by mouth at bedtime. 100 tablet 3   triamterene -hydrochlorothiazide  (DYAZIDE ) 37.5-25 MG capsule TAKE 1 CAPSULE BY MOUTH EACH  DAILY 90 capsule 1   zinc  gluconate 50 MG tablet Take 50 mg by mouth daily.     diphenoxylate -atropine  (LOMOTIL ) 2.5-0.025 MG tablet Take 1 tablet by mouth 4 (four) times daily as needed for diarrhea or loose stools. (Patient not taking: Reported on 07/24/2024) 120 tablet 0   ipratropium (ATROVENT ) 0.03 % nasal spray Place 2 sprays into both nostrils 2 (two) times daily. (Patient not taking: Reported on 07/24/2024)     morphine  (ROXANOL) 20 MG/ML concentrated solution Take 0.75 mLs (15 mg total) by mouth every 2 (two) hours as needed for moderate pain (pain score 4-6), severe pain (pain score 7-10), anxiety or shortness of breath. (Patient not taking: Reported on 07/24/2024) 60 mL 0   Facility-Administered Medications Ordered in Other Encounters  Medication Dose Route Frequency Provider Last Rate Last Admin   heparin  lock flush 100 UNIT/ML injection              Review of Systems Full ROS  was asked and was negative except for the information on the HPI  Physical Exam Blood pressure 116/66, pulse (!) 108, temperature 97.9 F (36.6 C), temperature source Oral, resp. rate (!) 30, height 5' 4 (1.626 m), weight 73 kg, SpO2 97%. CONSTITUTIONAL: chronically ill, debilitated and malnourished . She is a bit encephalopathic, dyspneic EYES: Pupils are equal, round, Sclera are non-icteric. EARS, NOSE, MOUTH AND THROAT: The oropharynx is clear. The oral mucosa is pink  and moist. Hearing is intact to voice. LYMPH NODES:  Lymph nodes in the neck are normal. RESPIRATORY:  Lungs are clear. Shallow resp effort tachypnea with equal breath sounds bilaterally, CARDIOVASCULAR: Heart is regular without murmurs, gallops, or rubs. GI: The abdomen is  soft, distended, diffuse tenderness w/o peritonitis, ostomy in place GU: Rectal deferred.   SKIN:right lower quadrant and inguinal region with direct soft tissue tumor invasion NEUROLOGIC: Motor and sensation is grossly normal. Cranial nerves are grossly intact. PSYCH: encephalopathic and somnolent  Data Reviewed I have personally reviewed the patient's imaging, laboratory findings and medical records.    Assessment/Plan 78 year old female with progressive metastatic colon cancer.  She does have metastases to the liver, lungs small bowel and subcu tissue.  Her disease has progressed and now she has developed a malignant small bowel obstruction likely from progression of tumor burden.  In addition to this she has esophageal perforation that is also likely as a result of from tumor progression. I had an extensive discussion with the patient and the family.  Treatment options are very limited.  I encouraged them to focus on quality of life.  They are agreeable with no major surgical intervention or heroic measures.  Son was a little bit hesitant and wanted potential endoscopic options but also understand limitations. I Did explain to them that I do think that Mrs. Pipkins is still worse the end of her life and that her cancer has progressed to the point of not return. I provided compassionate psychotherapy and supportive listening.  It has not been easy for any of them including the patient and the family, which is very understandable.  It Is a very unfortunate situation.  From a surgical perspective not much I can offer. For now they wish to continue IV fluids IV antibiotics and want to talk to the palliative/hospice team  regarding next steps. We will be available  I personally spent a total of 75 minutes in the care of the patient today including performing a medically appropriate exam/evaluation, counseling and educating, placing orders, referring and communicating with other health care professionals, documenting clinical information in the EHR, independently interpreting and reviewing images studies and coordinating care.    Laneta Luna, MD FACS General Surgeon 07/25/2024, 9:08 AM

## 2024-07-25 NOTE — Progress Notes (Signed)
 Progress Note   Patient: Jacqueline Yoder FMW:[id number] DOB: 09-Mar-1946 DOA: 07/24/2024     0 DOS: the patient was seen and examined on 07/25/2024   Brief hospital course: 78 y.o. female with medical history significant for  HTN, HLD, and rectal cancer with lung and bony metastases admitted for SBO in July 2025 which resolved spontaneously being admitted with SBO with concern for esophageal perforation with pneumomediastinum on CT.  Transfer was attempted to [facility] however CT surgery recommended transfer to [facility], but [facility] said that surgery should be consulted to address the SBO.  (Please see ED provider's note ).  Surgeon, Dr. Jordis was ultimately consulted.  After discussion with family it was reported that patient was placed comfort care and hospitalist consulted for admission.  In speaking with patient as well as daughter and POA at bedside along with another daughter and son also at bedside, family stated that patient is DNR/DNI and does not want surgery but they are interested in conservative treatment and after explaining comfort care, they are not yet ready for comfort care. In the ED patient remained tachycardic and tachypneic but afebrile. WBC was normal with lactic acid 2.1, electrolyte abnormalities of hyponatremia and mild hyperkalemia and normal creatinine.  Urinalysis was consistent with UTI. CT abdomen and pelvis showing SBO with possible small esophageal perforation with pneumoperitoneum Patient was started on Zosyn, LR Patient being admitted to the hospitalist service Family is open to a palliative care consult   10/10.  General surgery Dr. Jordis spoke with family about not doing surgery.  Patient not in any pain or having any nausea.  Admitted with bowel obstruction and esophageal perforation.  Hospice consulted.  Assessment and Plan: * Esophageal perforation General Surgery spoke with family and decided not to do surgery.  Patient can have sips of water today.  Empiric  antibiotic.  SBO (small bowel obstruction) (HCC) No air in ostomy.  Bowel sounds absent.  No nausea or vomiting.  Lactic acidosis Secondary to esophageal perforation and/or small bowel obstruction.  Colon cancer metastasized to lung [facility]) And bone.  Family signed up for hospice last week.  Patient is a DO NOT RESUSCITATE  Hyponatremia Sodium 127  Essential hypertension Holding medication  Dyslipidemia Holding meds  Decubitus ulcer of coccyx, stage 2 (HCC) Wound 05/04/24 2100 Pressure Injury Coccyx Mid Stage 2 -  Partial thickness loss of dermis presenting as a shallow open injury with a red, pink wound bed without slough. (Active)         Subjective: Initially came in with abdominal pain.  Found to have small bowel obstruction and esophageal perforation.  Physical Exam: Vitals:   07/25/24 0630 07/25/24 0825 07/25/24 1122 07/25/24 1300  BP: 116/66  113/66 112/64  Pulse: (!) 108  (!) 106 (!) 108  Resp: (!) 30  (!) 24 (!) 30  Temp:  97.9 F (36.6 C)  98 F (36.7 C)  TempSrc:  Oral    SpO2: 97%  92% 95%  Weight:      Height:       Physical Exam HENT:     Head: Normocephalic.  Eyes:     General: Lids are normal.  Cardiovascular:     Rate and Rhythm: Normal rate and regular rhythm.     Heart sounds: Normal heart sounds, S1 normal and S2 normal.  Pulmonary:     Breath sounds: Examination of the right-lower field reveals decreased breath sounds. Examination of the left-lower field reveals decreased breath sounds. Decreased breath  sounds present. No wheezing, rhonchi or rales.  Abdominal:     General: Bowel sounds are absent.     Palpations: Abdomen is soft.     Tenderness: There is no abdominal tenderness.     Comments: No gas in ostomy bag  Musculoskeletal:     Right lower leg: No swelling.     Left lower leg: No swelling.  Skin:    General: Skin is warm.     Findings: No rash.  Neurological:     Mental Status: She is alert.     Data Reviewed: Lactic  acid 2.1, sodium 127, creatinine 0.67, INR 1.3, white blood cell count 6.1, hemoglobin 12.1, platelet count 244  Family Communication: Family at bedside  Disposition: Status is: Observation Hospice liaison to speak with family about hospice facility.  Continue IV fluids and antibiotics while here  Planned Discharge Destination: Potential hospice facility    Time spent: 35 minutes Spoke with general surgery, hospice  Author: Charlie Patterson, MD 07/25/2024 1:46 PM  For on call review [email or web address].

## 2024-07-25 NOTE — Progress Notes (Signed)
 [facility] Hospitalized Hospice Patient  Jacqueline Yoder is a current hospice patient followed at home for terminal diagnosis of Cancer of the rectum, Anus and anal canal who presented to the ED with diagnosis of esophageal perforation with pneumomediastinum & SBO.  Per Dr. Norleen Laurence, hospice MD, this is a covered hospitalization.  Met with Jacqueline Yoder at the bedside today along with her 2 daughters, her son and many other family members to discuss goals of care.  Transparent conversation with patient/family on current hospitalization, diagnosis and their significance along with her current widely metastatic cancer.  She made it clear she wanted to live and spend as much time as she could with her family- but acknowledges that her faith and life are in her Lord's hands and that is the only one she puts her trust in.  She is in agreement with treating her UTI with antibiotics and then making a decision on next steps when that is complete.  Her children also verbalized this is their wishes.  I communicated this to hospital medical team.  Hospital liaison team will continue to follow through final disposition with further conversations with next steps as her antibiotics come to an end.  Jacqueline Yoder is appropriate for GIP level of care requiring IV antibiotics and skilled care for current diagnosis of SBO and esophageal perforation.  Vital Signs:  T 97.9 oral, BP 116/66, P 108, R 30, Oxi 97%  Abnormal Labs: Lactic acid 2.1, sodium 127, creatinine 0.67, INR 1.3, white blood cell count 6.1, hemoglobin 12.1, platelet count 244, albumin 2.3, AST 64, Total protein 6.0,   Diagnostics: CLINICAL DATA:  Provided history: Bowel obstruction suspected   Abdominal pain for 2 days. Patient reports no bowel movement since Tuesday. History of metastatic colon cancer. On hospice.   EXAM: CT ABDOMEN AND PELVIS WITH CONTRAST   TECHNIQUE: Multidetector CT imaging of the abdomen and pelvis was  performed using the standard protocol following bolus administration of intravenous contrast.   RADIATION DOSE REDUCTION: This exam was performed according to the departmental dose-optimization program which includes automated exposure control, adjustment of the mA and/or kV according to patient size and/or use of iterative reconstruction technique.   CONTRAST:  80mL OMNIPAQUE  IOHEXOL  300 MG/ML  SOLN   COMPARISON:  CT 05/04/2024   FINDINGS: Lower chest: Pneumomediastinum in the lower thorax with air surrounding the esophagus pulmonary metastatic disease with innumerable pulmonary nodules, increased in size and number from prior exam. Small bilateral pleural effusions and compressive atelectasis, new.   Hepatobiliary: No evidence of focal liver lesion. Stable biliary tree post cholecystectomy.   Pancreas: No ductal dilatation or inflammation.   Spleen: Stable cyst in the inferior spleen.  No splenomegaly.   Adrenals/Urinary Tract: No adrenal nodule. No hydronephrosis. Multiple renal cysts. No further follow-up imaging is recommended. Partially distended urinary bladder, normal for degree of distension.   Stomach/Bowel: Pneumomediastinum adjacent to the distal esophagus extending to the gastroesophageal junction. The stomach is prominently fluid distended. Dilated fluid-filled proximal small bowel with transition point in the central lower abdomen/pelvis, series 5, image 19 and series 2, image 58. The more distal small bowel is decompressed. No small bowel pneumatosis. The appendix is normal. Left-sided colostomy. Parastomal hernia contains a short segment of transverse colon, but no wall thickening or obstruction. Moderate colonic stool burden. Colonic diverticulosis without diverticulitis. Prior abdominal perineal resection.   Vascular/Lymphatic: Aortic atherosclerosis. No aneurysm. The portal, splenic, and mesenteric veins are patent. Lobulated soft tissue density  posterior to the aorta at the level of the kidneys may represent retroperitoneal adenopathy or soft tissue component of subjacent bone lesion, series 2, image 36. Right inguinal adenopathy, 13 mm right inguinal node, series 2, image 81 and external iliac adenopathy series 2, image 68. Right inguinal soft tissue density occupies the expected location of the right femoral vein, series 2, image 74.   Reproductive: Hysterectomy.   Other: Pneumomediastinum in the lower thorax. No free intra-abdominal air. Small volume pelvic ascites. There is marked body wall edema with confluent fluid in the flanks.   Musculoskeletal: Heterogeneous enlargement of the right obturator musculature, series 2, image 82, nonspecific but possibly due to metastatic infiltration new superior endplate compression deformity of L3 with adjacent soft tissue thickening anteriorly. New compression deformity of T12 suspected multiple lytic lesions throughout the osseous structures, although osteoporosis limits assessment.   IMPRESSION: 1. Small bowel obstruction with transition point in the central lower abdomen/pelvis. 2. Pneumomediastinum in the lower thorax with air surrounding the distal esophagus and gastroesophageal junction. This is suspicious for esophageal perforation. 3. Progression of pulmonary metastatic disease with innumerable pulmonary nodules, increased in size and number from prior exam. 4. New small bilateral pleural effusions and compressive atelectasis. 5. New compression deformities of T12 and L3, likely pathologic. 6. Right inguinal and external iliac adenopathy. Lobulated soft tissue density posterior to the aorta at the level of the kidneys may represent retroperitoneal adenopathy or soft tissue component of subjacent bone lesion. 7. Heterogeneous enlargement of the right obturator musculature, nonspecific but possibly due to metastatic infiltration. 8. Marked body wall edema with free  fluid in the flanks. 9. Left-sided colostomy with parastomal hernia containing a short segment of transverse colon, but no wall thickening or obstruction.   Aortic Atherosclerosis (ICD10-I70.0).   Critical Value/emergent results were called by telephone at the time of interpretation on 07/24/2024 at 2:47 pm to provider PHILLIP STAFFORD , who verbally acknowledged these results.     Electronically Signed   By: Andrea Gasman M.D.   On: 07/24/2024 14:47    Assessment & Plan:  per H&P  Dr. Charlie Patterson 10.10.25  Esophageal perforation General Surgery spoke with family and decided not to do surgery.  Patient can have sips of water today.  Empiric antibiotic.   SBO (small bowel obstruction) (HCC) No air in ostomy.  Bowel sounds absent.  No nausea or vomiting.   Lactic acidosis Secondary to esophageal perforation and/or small bowel obstruction.   Colon cancer metastasized to lung [facility]) And bone.  Family signed up for hospice last week.  Patient is a DO NOT RESUSCITATE   Hyponatremia Sodium 127   Essential hypertension Holding medication   Dyslipidemia Holding meds   Decubitus ulcer of coccyx, stage 2 (HCC) Wound 05/04/24 2100 Pressure Injury Coccyx Mid Stage 2 -  Partial thickness loss of dermis presenting as a shallow open injury with a red, pink wound bed without slough. (Active)   Discharge planning- first day of hospitalization- ongoing.  Discussion was initiated with patient/family today.  IDT- Updated  Goals of care-  DNRclear- see above.  Family contact- GOC meeting at bedside.  Please do not hesitate to call for any hospice related questions or concerns.  Saddie HILARIO Na, RN Nurse Liaison [phone]

## 2024-07-25 NOTE — Assessment & Plan Note (Addendum)
 And bone.  Family signed up for hospice last week.  Patient is a DO NOT RESUSCITATE

## 2024-07-25 NOTE — Assessment & Plan Note (Signed)
 Secondary to esophageal perforation and/or small bowel obstruction.

## 2024-07-25 NOTE — Progress Notes (Signed)
 Pharmacy Antibiotic Note  Jacqueline Yoder is a 78 y.o. female admitted on 07/24/2024 with intra-abdominal infection.  Pharmacy has been consulted for Zosyn dosing.  Plan: Zosyn 3.375g IV q8h (4 hour infusion).  Height: 5' 4 (162.6 cm) Weight: 73 kg (161 lb) IBW/kg (Calculated) : 54.7  Temp (24hrs), Avg:99.1 F (37.3 C), Min:98.4 F (36.9 C), Max:99.8 F (37.7 C)  Recent Labs  Lab 07/24/24 1316 07/24/24 1559  WBC 6.1  --   CREATININE 0.62  --   LATICACIDVEN 2.0* 2.1*    Estimated Creatinine Clearance: 57.6 mL/min (by C-G formula based on SCr of 0.62 mg/dL).    Allergies  Allergen Reactions   Sulfamethoxazole-Trimethoprim Other (See Comments)    Also said she had chills and pressure in nose.    Antimicrobials this admission:   >>    >>   Dose adjustments this admission:   Microbiology results:  BCx:   UCx:    Sputum:    MRSA PCR:   Thank you for allowing pharmacy to be a part of this patient's care.  Lollie Gunner D 07/25/2024 4:07 AM

## 2024-07-25 NOTE — ED Notes (Signed)
 Chaplain services offered to patient and family members in which they politely declined at this time.

## 2024-07-25 NOTE — ED Notes (Signed)
 Secure chatted Dr. Cleatus Harari. the patients family said they are still confused on the difference between hospice, palliative, and comfort care. is there a Child psychotherapist that can come this morning sometime to really sit down and talk to them? They are confused what the next steps are.

## 2024-07-25 NOTE — Assessment & Plan Note (Signed)
 Holding medication

## 2024-07-25 NOTE — Assessment & Plan Note (Addendum)
 Wound 05/04/24 2100 Pressure Injury Coccyx Mid Stage 2 -  Partial thickness loss of dermis presenting as a shallow open injury with a red, pink wound bed without slough. (Active)

## 2024-07-25 NOTE — Assessment & Plan Note (Addendum)
 Patient has had bowel movements.  Try to advance to solid food today.  No nausea or vomiting.

## 2024-07-25 NOTE — H&P (Signed)
 History and Physical    Patient: Jacqueline Yoder FMW:[id number] DOB: November 30, 1945 DOA: 07/24/2024 DOS: the patient was seen and examined on 07/25/2024 PCP: Edman Marsa PARAS, DO  Patient coming from: Home  Chief Complaint:  Chief Complaint  Patient presents with   Abdominal Pain    HPI: Jacqueline Yoder is a 78 y.o. female with medical history significant for  HTN, HLD, and rectal cancer with lung and bony metastases admitted for SBO in July 2025 which resolved spontaneously being admitted with SBO with concern for esophageal perforation with pneumomediastinum on CT.  Transfer was attempted to [facility] however CT surgery recommended transfer to [facility], but [facility] said that surgery should be consulted to address the SBO.  (Please see ED provider's note ).  Surgeon, Dr. Jordis was ultimately consulted.  After discussion with family it was reported that patient was placed comfort care and hospitalist consulted for admission.  In speaking with patient as well as daughter and POA at bedside along with another daughter and son also at bedside, family stated that patient is DNR/DNI and does not want surgery but they are interested in conservative treatment and after explaining comfort care, they are not yet ready for comfort care. In the ED patient remained tachycardic and tachypneic but afebrile. WBC was normal with lactic acid 2.1, electrolyte abnormalities of hyponatremia and mild hyperkalemia and normal creatinine.  Urinalysis was consistent with UTI. CT abdomen and pelvis showing SBO with possible small esophageal perforation with pneumoperitoneum Patient was started on Zosyn, LR Patient being admitted to the hospitalist service Family is open to a palliative care consult     Past Medical History:  Diagnosis Date   Allergy    Arthritis    Blood clot in vein    Family history of colon cancer    GERD (gastroesophageal reflux disease)    Hypercholesteremia    Hypertension    Hypertension     Lower extremity edema    Personal history of chemotherapy    Rectal cancer (HCC) 12/2018   Urinary incontinence    Past Surgical History:  Procedure Laterality Date   ABDOMINAL HYSTERECTOMY     CHOLECYSTECTOMY  1971   COLONOSCOPY WITH PROPOFOL  N/A 12/03/2018   Procedure: COLONOSCOPY WITH PROPOFOL ;  Surgeon: Jinny Carmine, MD;  Location: [facility] ENDOSCOPY;  Service: Endoscopy;  Laterality: N/A;   COLONOSCOPY WITH PROPOFOL  N/A 07/15/2020   Procedure: COLONOSCOPY WITH PROPOFOL ;  Surgeon: Therisa Bi, MD;  Location[facility] ENDOSCOPY;  Service: Gastroenterology;  Laterality: N/A;   FLEXIBLE SIGMOIDOSCOPY N/A 12/06/2018   Procedure: FLEXIBLE SIGMOIDOSCOPY;  Surgeon: Therisa Bi, MD;  Location: [facility] ENDOSCOPY;  Service: Endoscopy;  Laterality: N/A;   LAPAROSCOPIC COLOSTOMY  01/06/2019   PORTACATH PLACEMENT N/A 04/03/2019   Procedure: INSERTION PORT-A-CATH;  Surgeon: Jordis Laneta JULIANNA, MD;  Location: [facility] ORS;  Service: General;  Laterality: N/A;   Social History:  reports that she quit smoking about 46 years ago. Her smoking use included cigarettes. She has quit using smokeless tobacco. She reports that she does not drink alcohol and does not use drugs.  Allergies  Allergen Reactions   Sulfamethoxazole-Trimethoprim Other (See Comments)    Also said she had chills and pressure in nose.    Family History  Problem Relation Age of Onset   Colon cancer Brother 20       exposure to chemicals [place]   Hypertension Mother    Stroke Mother    Kidney failure Father    Breast cancer Neg Hx  Ovarian cancer Neg Hx     Prior to Admission medications   Medication Sig Start Date End Date Taking? Authorizing Provider  acetaminophen  (TYLENOL ) 650 MG CR tablet Take 500 mg by mouth every 8 (eight) hours as needed for pain.   Yes [provider]  apixaban  (ELIQUIS ) 2.5 MG TABS tablet Take 1 tablet (2.5 mg total) by mouth 2 (two) times daily. 02/14/24  Yes Babara Call, MD  Cholecalciferol  (VITAMIN D3) 2000  units capsule Take 2,000 Units by mouth daily.   Yes [provider]  Cinnamon 500 MG TABS Take 500 tablets by mouth daily.   Yes [provider]  fexofenadine (ALLEGRA) 180 MG tablet Take 180 mg by mouth daily.   Yes [provider]  fluticasone  (FLONASE ) 50 MCG/ACT nasal spray Place 1 spray into both nostrils daily as needed for allergies or rhinitis. 09/20/23  Yes Barbarann Nest, MD  gabapentin  (NEURONTIN ) 100 MG capsule Start 1 capsule daily at bedtime and increase by 1 cap every 2-3 days as tolerated up to max dose 3 = 300mg  in evening or 100mg  3 times a day if preferred. 07/23/24  Yes Karamalegos, Marsa PARAS, DO  magnesium  chloride (SLOW-MAG) 64 MG TBEC SR tablet Take 1 tablet (64 mg total) by mouth 2 (two) times daily. 03/13/23  Yes Babara Call, MD  metroNIDAZOLE  (METROGEL ) 1 % gel Apply 1 Application topically daily. 06/25/24  Yes [provider]  Multiple Vitamins-Minerals (ONE-A-DAY WOMENS 50 PLUS PO) Take 1 tablet by mouth daily.    Yes [provider]  nystatin  (MYCOSTATIN /NYSTOP ) powder Apply 1 Application topically daily. 04/08/24  Yes [provider]  ondansetron  (ZOFRAN ) 8 MG tablet Take 8 mg by mouth every 8 (eight) hours as needed for nausea or vomiting.   Yes [provider]  oxyCODONE  (OXY IR/ROXICODONE ) 5 MG immediate release tablet Take 10 mg by mouth every 4 (four) hours as needed for severe pain (pain score 7-10). 07/18/24  Yes [provider]  polyethylene glycol powder (GLYCOLAX/MIRALAX) 17 GM/SCOOP powder Take 17 g by mouth daily. 07/22/24  Yes [provider]  potassium chloride  SA (KLOR-CON  M) 20 MEQ tablet TAKE 1 TABLET BY MOUTH DAILY 02/19/24  Yes Babara Call, MD  senna-docusate (SENOKOT-S) 8.6-50 MG tablet Take 2 tablets by mouth 2 (two) times daily.   Yes [provider]  simvastatin  (ZOCOR ) 40 MG tablet Take 1 tablet (40 mg total) by mouth at bedtime. 07/11/23  Yes Karamalegos, Marsa PARAS, DO   triamterene -hydrochlorothiazide  (DYAZIDE ) 37.5-25 MG capsule TAKE 1 CAPSULE BY MOUTH EACH  DAILY 04/25/24  Yes Karamalegos, Marsa PARAS, DO  zinc  gluconate 50 MG tablet Take 50 mg by mouth daily.   Yes [provider]  diphenoxylate -atropine  (LOMOTIL ) 2.5-0.025 MG tablet Take 1 tablet by mouth 4 (four) times daily as needed for diarrhea or loose stools. Patient not taking: Reported on 07/24/2024 09/11/23   Yu, Zhou, MD  ipratropium (ATROVENT ) 0.03 % nasal spray Place 2 sprays into both nostrils 2 (two) times daily. Patient not taking: Reported on 07/24/2024 01/29/24   [provider]  morphine  (ROXANOL) 20 MG/ML concentrated solution Take 0.75 mLs (15 mg total) by mouth every 2 (two) hours as needed for moderate pain (pain score 4-6), severe pain (pain score 7-10), anxiety or shortness of breath. Patient not taking: Reported on 07/24/2024 07/23/24   Edman Marsa PARAS, DO    Physical Exam: Vitals:   07/25/24 0100 07/25/24 0130 07/25/24 0234 07/25/24 0249  BP: 121/72 116/69  112/68  Pulse: (!) 109 (!) 109  (!) 108  Resp:  (!) 31  (!) 30  Temp:   99.8 F (37.7 C)   TempSrc:   Oral   SpO2: 96% 96%  100%  Weight:      Height:       Physical Exam Vitals and nursing note reviewed.  Constitutional:      General: She is not in acute distress.    Comments: Awake and in no distress, spitting up large amounts of greenish liquid into emesis bag, without actively vomiting  HENT:     Head: Normocephalic and atraumatic.  Cardiovascular:     Rate and Rhythm: Regular rhythm. Tachycardia present.     Heart sounds: Normal heart sounds.  Pulmonary:     Effort: Pulmonary effort is normal.     Breath sounds: Normal breath sounds.  Abdominal:     General: There is distension.     Tenderness: There is no abdominal tenderness.  Neurological:     Mental Status: Mental status is at baseline.     Labs on Admission: I have personally reviewed following labs and imaging  studies  CBC: Recent Labs  Lab 07/24/24 1316  WBC 6.1  NEUTROABS 4.9  HGB 12.1  HCT 35.9*  MCV 89.5  PLT 244   Basic Metabolic Panel: Recent Labs  Lab 07/24/24 1316  NA 125*  K 5.3*  CL 87*  CO2 25  GLUCOSE 118*  BUN 18  CREATININE 0.62  CALCIUM  9.3   GFR: Estimated Creatinine Clearance: 57.6 mL/min (by C-G formula based on SCr of 0.62 mg/dL). Liver Function Tests: Recent Labs  Lab 07/24/24 1316  AST 99*  ALT 42  ALKPHOS 107  BILITOT 0.8  PROT 6.8  ALBUMIN 2.7*   No results for input(s): LIPASE, AMYLASE in the last 168 hours. No results for input(s): AMMONIA in the last 168 hours. Coagulation Profile: Recent Labs  Lab 07/24/24 1351  INR 1.3*   Cardiac Enzymes: No results for input(s): CKTOTAL, CKMB, CKMBINDEX, TROPONINI in the last 168 hours. BNP (last 3 results) No results for input(s): PROBNP in the last 8760 hours. HbA1C: No results for input(s): HGBA1C in the last 72 hours. CBG: No results for input(s): GLUCAP in the last 168 hours. Lipid Profile: No results for input(s): CHOL, HDL, LDLCALC, TRIG, CHOLHDL, LDLDIRECT in the last 72 hours. Thyroid  Function Tests: No results for input(s): TSH, T4TOTAL, FREET4, T3FREE, THYROIDAB in the last 72 hours. Anemia Panel: No results for input(s): VITAMINB12, FOLATE, FERRITIN, TIBC, IRON, RETICCTPCT in the last 72 hours. Urine analysis:    Component Value Date/Time   COLORURINE YELLOW (A) 07/24/2024 1350   APPEARANCEUR CLOUDY (A) 07/24/2024 1350   LABSPEC 1.025 07/24/2024 1350   PHURINE 5.0 07/24/2024 1350   GLUCOSEU NEGATIVE 07/24/2024 1350   HGBUR LARGE (A) 07/24/2024 1350   BILIRUBINUR NEGATIVE 07/24/2024 1350   BILIRUBINUR neg 03/29/2020 1213   KETONESUR NEGATIVE 07/24/2024 1350   PROTEINUR 100 (A) 07/24/2024 1350   UROBILINOGEN 0.2 03/29/2020 1213   NITRITE NEGATIVE 07/24/2024 1350   LEUKOCYTESUR MODERATE (A) 07/24/2024 1350     Radiological Exams on Admission: CT ABDOMEN PELVIS W CONTRAST Result Date: 07/24/2024 CLINICAL DATA:  Provided history: Bowel obstruction suspected Abdominal pain for 2 days. Patient reports no bowel movement since Tuesday. History of metastatic colon cancer. On hospice. EXAM: CT ABDOMEN AND PELVIS WITH CONTRAST TECHNIQUE: Multidetector CT imaging of the abdomen and pelvis was performed using the standard protocol following bolus administration of intravenous  contrast. RADIATION DOSE REDUCTION: This exam was performed according to the departmental dose-optimization program which includes automated exposure control, adjustment of the mA and/or kV according to patient size and/or use of iterative reconstruction technique. CONTRAST:  80mL OMNIPAQUE  IOHEXOL  300 MG/ML  SOLN COMPARISON:  CT 05/04/2024 FINDINGS: Lower chest: Pneumomediastinum in the lower thorax with air surrounding the esophagus pulmonary metastatic disease with innumerable pulmonary nodules, increased in size and number from prior exam. Small bilateral pleural effusions and compressive atelectasis, new. Hepatobiliary: No evidence of focal liver lesion. Stable biliary tree post cholecystectomy. Pancreas: No ductal dilatation or inflammation. Spleen: Stable cyst in the inferior spleen.  No splenomegaly. Adrenals/Urinary Tract: No adrenal nodule. No hydronephrosis. Multiple renal cysts. No further follow-up imaging is recommended. Partially distended urinary bladder, normal for degree of distension. Stomach/Bowel: Pneumomediastinum adjacent to the distal esophagus extending to the gastroesophageal junction. The stomach is prominently fluid distended. Dilated fluid-filled proximal small bowel with transition point in the central lower abdomen/pelvis, series 5, image 19 and series 2, image 58. The more distal small bowel is decompressed. No small bowel pneumatosis. The appendix is normal. Left-sided colostomy. Parastomal hernia contains a short  segment of transverse colon, but no wall thickening or obstruction. Moderate colonic stool burden. Colonic diverticulosis without diverticulitis. Prior abdominal perineal resection. Vascular/Lymphatic: Aortic atherosclerosis. No aneurysm. The portal, splenic, and mesenteric veins are patent. Lobulated soft tissue density posterior to the aorta at the level of the kidneys may represent retroperitoneal adenopathy or soft tissue component of subjacent bone lesion, series 2, image 36. Right inguinal adenopathy, 13 mm right inguinal node, series 2, image 81 and external iliac adenopathy series 2, image 68. Right inguinal soft tissue density occupies the expected location of the right femoral vein, series 2, image 74. Reproductive: Hysterectomy. Other: Pneumomediastinum in the lower thorax. No free intra-abdominal air. Small volume pelvic ascites. There is marked body wall edema with confluent fluid in the flanks. Musculoskeletal: Heterogeneous enlargement of the right obturator musculature, series 2, image 82, nonspecific but possibly due to metastatic infiltration new superior endplate compression deformity of L3 with adjacent soft tissue thickening anteriorly. New compression deformity of T12 suspected multiple lytic lesions throughout the osseous structures, although osteoporosis limits assessment. IMPRESSION: 1. Small bowel obstruction with transition point in the central lower abdomen/pelvis. 2. Pneumomediastinum in the lower thorax with air surrounding the distal esophagus and gastroesophageal junction. This is suspicious for esophageal perforation. 3. Progression of pulmonary metastatic disease with innumerable pulmonary nodules, increased in size and number from prior exam. 4. New small bilateral pleural effusions and compressive atelectasis. 5. New compression deformities of T12 and L3, likely pathologic. 6. Right inguinal and external iliac adenopathy. Lobulated soft tissue density posterior to the aorta at the  level of the kidneys may represent retroperitoneal adenopathy or soft tissue component of subjacent bone lesion. 7. Heterogeneous enlargement of the right obturator musculature, nonspecific but possibly due to metastatic infiltration. 8. Marked body wall edema with free fluid in the flanks. 9. Left-sided colostomy with parastomal hernia containing a short segment of transverse colon, but no wall thickening or obstruction. Aortic Atherosclerosis (ICD10-I70.0). Critical Value/emergent results were called by telephone at the time of interpretation on 07/24/2024 at 2:47 pm to provider PHILLIP STAFFORD , who verbally acknowledged these results. Electronically Signed   By: Andrea Gasman M.D.   On: 07/24/2024 14:47   DG Chest Port 1 View Result Date: 07/24/2024 CLINICAL DATA:  Questionable sepsis, metastatic colon cancer EXAM: PORTABLE CHEST 1 VIEW COMPARISON:  04/10/2024  FINDINGS: The heart size and mediastinal contours are within normal limits. Numerous bilateral nodular pulmonary metastases. Sclerotic osseous metastasis of the proximal right humerus. IMPRESSION: 1. Numerous bilateral nodular pulmonary metastases. No focal airspace opacity. CT may be helpful to assess for acutely superimposed airspace opacity. 2. Sclerotic osseous metastasis of the proximal right humerus. Electronically Signed   By: Marolyn JONETTA Jaksch M.D.   On: 07/24/2024 14:19   Data Reviewed for HPI: Relevant notes from primary care and specialist visits, past discharge summaries as available in EHR, including Care Everywhere. Prior diagnostic testing as pertinent to current admission diagnoses Updated medications and problem lists for reconciliation ED course, including vitals, labs, imaging, treatment and response to treatment Triage notes, nursing and pharmacy notes and ED provider's notes Notable results as noted above in HPI      Assessment and Plan:   Partial small bowel obstruction Small esophageal perforation with  pneumomediastinum on CT Colon cancer metastatic to lung and bone Presented with abdominal pain General Surgery consulted,  Will benefit from NG tube but not placed due to concern for esophageal She is at high risk for ongoing issues and this has been clearly explained to the patient and her family Will continue Zosyn, IV fluids, IV antiemetics and IV pain meds Will consult surgery to follow Patient is DNR/DNI but not comfort care Palliative care consult placed     Dyslipidemia Essential hypertension Stable, holding home meds due to SBO and vomiting   H/o DVT/PE Held Eliquis  due to possible need for surgery on presentation Will resume Eliquis  on 7/21, assuming no further issues     DNR/DNI DNR confirmed at the time of admission [facility] documents reviewed     DVT prophylaxis: SCD  Consults: Surgery  Advance Care Planning:   Code Status: Prior   Family Communication: Daughters and son at bedside  Disposition Plan: Back to previous home environment  Severity of Illness: The appropriate patient status for this patient is OBSERVATION. Observation status is judged to be reasonable and necessary in order to provide the required intensity of service to ensure the patient's safety. The patient's presenting symptoms, physical exam findings, and initial radiographic and laboratory data in the context of their medical condition is felt to place them at decreased risk for further clinical deterioration. Furthermore, it is anticipated that the patient will be medically stable for discharge from the hospital within 2 midnights of admission.   Author: Delayne LULLA Solian, MD 07/25/2024 3:29 AM  For on call review [email or web address].

## 2024-07-25 NOTE — Assessment & Plan Note (Addendum)
Sodium 134

## 2024-07-26 DIAGNOSIS — N3090 Cystitis, unspecified without hematuria: Secondary | ICD-10-CM | POA: Insufficient documentation

## 2024-07-26 DIAGNOSIS — K56609 Unspecified intestinal obstruction, unspecified as to partial versus complete obstruction: Secondary | ICD-10-CM | POA: Diagnosis not present

## 2024-07-26 DIAGNOSIS — S31109A Unspecified open wound of abdominal wall, unspecified quadrant without penetration into peritoneal cavity, initial encounter: Secondary | ICD-10-CM | POA: Insufficient documentation

## 2024-07-26 DIAGNOSIS — N3001 Acute cystitis with hematuria: Secondary | ICD-10-CM | POA: Insufficient documentation

## 2024-07-26 DIAGNOSIS — E872 Acidosis, unspecified: Secondary | ICD-10-CM | POA: Diagnosis not present

## 2024-07-26 DIAGNOSIS — C189 Malignant neoplasm of colon, unspecified: Secondary | ICD-10-CM | POA: Diagnosis not present

## 2024-07-26 DIAGNOSIS — S31109S Unspecified open wound of abdominal wall, unspecified quadrant without penetration into peritoneal cavity, sequela: Secondary | ICD-10-CM

## 2024-07-26 DIAGNOSIS — K223 Perforation of esophagus: Secondary | ICD-10-CM | POA: Diagnosis not present

## 2024-07-26 LAB — BASIC METABOLIC PANEL WITH GFR
Anion gap: 10 (ref 5–15)
BUN: 17 mg/dL (ref 8–23)
CO2: 27 mmol/L (ref 22–32)
Calcium: 8.5 mg/dL — ABNORMAL LOW (ref 8.9–10.3)
Chloride: 92 mmol/L — ABNORMAL LOW (ref 98–111)
Creatinine, Ser: 0.72 mg/dL (ref 0.44–1.00)
GFR, Estimated: >60 mL/min (ref >60)
Glucose, Bld: 79 mg/dL (ref 70–99)
Potassium: 3.8 mmol/L (ref 3.5–5.1)
Sodium: 129 mmol/L — ABNORMAL LOW (ref 135–145)

## 2024-07-26 MED ORDER — SODIUM CHLORIDE 0.9 % IV SOLN: INTRAVENOUS | Status: DC

## 2024-07-26 NOTE — Plan of Care (Signed)

## 2024-07-26 NOTE — Progress Notes (Signed)
 Pt would benefit from specialty bed for rotation, wound consult ordered for ongoing right groin nonpressure wound. Family states they will talk to MD today about continued care vs comfort care and prognosis.

## 2024-07-26 NOTE — Progress Notes (Signed)
  [facility] Hospitalized Hospice Patient   [name] is a current hospice patient followed at home for terminal diagnosis of Cancer of the rectum, Anus and anal canal who presented to the ED with diagnosis of esophageal perforation with pneumomediastinum & SBO.  Per Dr. Norleen Laurence, hospice MD, this is a covered hospitalization.   [name] is appropriate for GIP level of care requiring IV antibiotics and skilled care for current diagnosis of SBO and esophageal perforation.   Vital Signs:   T 98.6 oral, BP 116/63, P 106, R 20   Abnormal Labs: Na 129, Cl 92, Ca 8.5  I&O:  None recorded    Diagnostics:  No new   IV Meds/PRN Zosyn 3.375 IVPB every 8 hours Morphine  IV 2mg  prn-  had 2mg  @ 0124, 0602, 1141, 1450, 1704  Assessment and Plan:  per Dr. Josette progress note 10.11.25 Esophageal perforation General Surgery spoke with family and decided not to do surgery on 10/10.  Patient can have sips of water.  Empiric antibiotic.   SBO (small bowel obstruction) (HCC) No air in ostomy.  Bowel sounds absent.  No nausea or vomiting.   Lactic acidosis Secondary to esophageal perforation and/or small bowel obstruction.   Colon cancer metastasized to lung [facility]) And bone.  Family signed up for hospice last week.  Patient is a DO NOT RESUSCITATE   Hyponatremia Sodium 129   Essential hypertension Holding medication   Dyslipidemia Holding meds   Acute cystitis with hematuria Urine culture growing multiple organisms.  The Zosyn that she is on would cover.   Right groin wound Appreciate wound care consultation.   Cleanse with saline, pat dry the peri-wound skin. Apply alginate #[id number] to the wound bed, cover with gauze or foam dressing. Change daily. Addendum: before remove the alginate, moisten with saline, the dressing will become a gelatinous and will come off easily.   Decubitus ulcer of coccyx, stage 2 (HCC) Wound 05/04/24 2100 Pressure Injury  Coccyx Mid Stage 2 -  Partial thickness loss of dermis presenting as a shallow open injury with a red, pink wound bed without slough. (Active)   Discharge planning- home after IV completion  IDT- Updated   Goals of care-  DNR clear- see above.   Family contact- multiple family at bedside   Please do not hesitate to call for any hospice related questions or concerns.   Saddie HILARIO Na, RN Nurse Liaison [phone]

## 2024-07-26 NOTE — Assessment & Plan Note (Signed)
 Appreciate wound care consultation.  Cleanse with saline, pat dry the peri-wound skin. Apply alginate #[id number] to the wound bed, cover with gauze or foam dressing. Change daily. Addendum: before remove the alginate, moisten with saline, the dressing will become a gelatinous and will come off easily.

## 2024-07-26 NOTE — Progress Notes (Signed)
 Progress Note   Patient: Jacqueline Yoder FMW:[id number] DOB: [birth year] DOA: 07/24/2024     1 DOS: the patient was seen and examined on 07/26/2024   Brief hospital course: 78 y.o. female with medical history significant for  HTN, HLD, and rectal cancer with lung and bony metastases admitted for SBO in July 2025 which resolved spontaneously being admitted with SBO with concern for esophageal perforation with pneumomediastinum on CT.  Transfer was attempted to [facility] however CT surgery recommended transfer to [facility], but [facility] said that surgery should be consulted to address the SBO.  (Please see ED provider's note ).  Surgeon, Dr. Jordis was ultimately consulted.  After discussion with family it was reported that patient was placed comfort care and hospitalist consulted for admission.  In speaking with patient as well as daughter and POA at bedside along with another daughter and son also at bedside, family stated that patient is DNR/DNI and does not want surgery but they are interested in conservative treatment and after explaining comfort care, they are not yet ready for comfort care. In the ED patient remained tachycardic and tachypneic but afebrile. WBC was normal with lactic acid 2.1, electrolyte abnormalities of hyponatremia and mild hyperkalemia and normal creatinine.  Urinalysis was consistent with UTI. CT abdomen and pelvis showing SBO with possible small esophageal perforation with pneumoperitoneum Patient was started on Zosyn, LR Patient being admitted to the hospitalist service Family is open to a palliative care consult   10/10.  General surgery Dr. Jordis spoke with family about not doing surgery.  Patient not in any pain or having any nausea.  Admitted with bowel obstruction and esophageal perforation.  Hospice consulted. 10/11.  Patient having some abdominal pain.  Still no gas passed.  Assessment and Plan: * Esophageal perforation General Surgery spoke with family and decided not to  do surgery on 10/10.  Patient can have sips of water.  Empiric antibiotic.  SBO (small bowel obstruction) (HCC) No air in ostomy.  Bowel sounds absent.  No nausea or vomiting.  Lactic acidosis Secondary to esophageal perforation and/or small bowel obstruction.  Colon cancer metastasized to lung [facility]) And bone.  Family signed up for hospice last week.  Patient is a DO NOT RESUSCITATE  Hyponatremia Sodium 129  Essential hypertension Holding medication  Dyslipidemia Holding meds  Acute cystitis with hematuria Urine culture growing multiple organisms.  The Zosyn that she is on would cover.  Right groin wound Appreciate wound care consultation.  Cleanse with saline, pat dry the peri-wound skin. Apply alginate #[id number] to the wound bed, cover with gauze or foam dressing. Change daily. Addendum: before remove the alginate, moisten with saline, the dressing will become a gelatinous and will come off easily.  Decubitus ulcer of coccyx, stage 2 (HCC) Wound 05/04/24 2100 Pressure Injury Coccyx Mid Stage 2 -  Partial thickness loss of dermis presenting as a shallow open injury with a red, pink wound bed without slough. (Active)         Subjective: Patient having some abdominal pain.  No passing gas.  Mated with esophageal rupture and small bowel obstruction.  Physical Exam: Vitals:   07/25/24 1430 07/25/24 2003 07/26/24 0629 07/26/24 0738  BP: 116/63 107/66 (!) 111/54 (!) 110/57  Pulse: (!) 106 (!) 110 (!) 109 (!) 109  Resp: 20 13 16 16   Temp: 98.6 F (37 C) 98.4 F (36.9 C) 98.8 F (37.1 C)   TempSrc: Oral     SpO2: 95% 96% 93% 93%  Weight:      Height:       Physical Exam HENT:     Head: Normocephalic.  Eyes:     General: Lids are normal.  Cardiovascular:     Rate and Rhythm: Normal rate and regular rhythm.     Heart sounds: Normal heart sounds, S1 normal and S2 normal.  Pulmonary:     Breath sounds: Examination of the right-lower field reveals decreased breath  sounds. Examination of the left-lower field reveals decreased breath sounds. Decreased breath sounds present. No wheezing, rhonchi or rales.  Abdominal:     General: Bowel sounds are absent.     Palpations: Abdomen is soft.     Tenderness: There is no abdominal tenderness.     Comments: No gas in ostomy bag  Musculoskeletal:     Right lower leg: No swelling.     Left lower leg: No swelling.  Skin:    General: Skin is warm.     Findings: No rash.  Neurological:     Mental Status: She is alert.     Data Reviewed: Sodium 129, creatinine 0.72  Family Communication: Family at bedside  Disposition: Status is: Inpatient Remains inpatient appropriate because: Monitoring day by day.  Family thinking about hospice in the next place to be.  Planned Discharge Destination: Hospice facility versus hospice at home    Time spent: 28 minutes  Author: Charlie Patterson, MD 07/26/2024 4:52 PM  For on call review [email or web address].

## 2024-07-26 NOTE — Consult Note (Addendum)
 WOC Nurse Consult Note: Reason for Consult: requested to assess a wound on R groin. Performed remote evaluating photo and notes. Wound type: metastase in soft tissue on pannus.  Pressure Injury POA: NA Measurement: aprox. 11 cm x 8 cm, multiple lesion facing each other, both sides of the groin. Wound bed: 100% red. Drainage (amount, consistency, odor) serosanguinous, current bleeding episodes. Periwound: intact. Dressing procedure/placement/frequency: Cleanse with saline, pat dry the peri-wound skin. Apply alginate #[id number] to the wound bed, cover with gauze or foam dressing. Change daily. Addendum: before remove the alginate, moisten with saline, the dressing will become a gelatinous and will come off easily.  WOC team will not plan to follow further. Please reconsult if further assistance is needed. Thank-you,  Lela Holm RN, CNS, [employer], MSN.  (Phone [phone])

## 2024-07-26 NOTE — Assessment & Plan Note (Signed)
 Urine culture growing multiple organisms.  The Zosyn that she is on would cover.

## 2024-07-27 DIAGNOSIS — C189 Malignant neoplasm of colon, unspecified: Secondary | ICD-10-CM | POA: Diagnosis not present

## 2024-07-27 DIAGNOSIS — M25511 Pain in right shoulder: Secondary | ICD-10-CM

## 2024-07-27 DIAGNOSIS — E872 Acidosis, unspecified: Secondary | ICD-10-CM | POA: Diagnosis not present

## 2024-07-27 DIAGNOSIS — N3001 Acute cystitis with hematuria: Secondary | ICD-10-CM

## 2024-07-27 DIAGNOSIS — K223 Perforation of esophagus: Secondary | ICD-10-CM | POA: Diagnosis not present

## 2024-07-27 DIAGNOSIS — G8929 Other chronic pain: Secondary | ICD-10-CM

## 2024-07-27 DIAGNOSIS — K56609 Unspecified intestinal obstruction, unspecified as to partial versus complete obstruction: Secondary | ICD-10-CM | POA: Diagnosis not present

## 2024-07-27 DIAGNOSIS — M25512 Pain in left shoulder: Secondary | ICD-10-CM

## 2024-07-27 LAB — RESP PANEL BY RT-PCR (RSV, FLU A&B, COVID)  RVPGX2
Influenza A by PCR: NEGATIVE
Influenza B by PCR: NEGATIVE
Resp Syncytial Virus by PCR: NEGATIVE
SARS Coronavirus 2 by RT PCR: NEGATIVE

## 2024-07-27 LAB — BASIC METABOLIC PANEL WITH GFR
Anion gap: 13 (ref 5–15)
BUN: 14 mg/dL (ref 8–23)
CO2: 23 mmol/L (ref 22–32)
Calcium: 8.2 mg/dL — ABNORMAL LOW (ref 8.9–10.3)
Chloride: 98 mmol/L (ref 98–111)
Creatinine, Ser: 0.63 mg/dL (ref 0.44–1.00)
GFR, Estimated: >60 mL/min (ref >60)
Glucose, Bld: 54 mg/dL — ABNORMAL LOW (ref 70–99)
Potassium: 3.4 mmol/L — ABNORMAL LOW (ref 3.5–5.1)
Sodium: 134 mmol/L — ABNORMAL LOW (ref 135–145)

## 2024-07-27 LAB — PHOSPHORUS: Phosphorus: 3.1 mg/dL (ref 2.5–4.6)

## 2024-07-27 LAB — MAGNESIUM: Magnesium: 1.8 mg/dL (ref 1.7–2.4)

## 2024-07-27 MED ORDER — POTASSIUM CHLORIDE 10 MEQ/100ML IV SOLN
10.0000 meq | INTRAVENOUS | Status: AC
  Administered 2024-07-27 (×2): 10 meq via INTRAVENOUS
  Filled 2024-07-27 (×2): qty 100

## 2024-07-27 MED ORDER — OXYCODONE HCL 5 MG PO TABS: 10.0000 mg | ORAL_TABLET | Freq: Four times a day (QID) | ORAL | Status: DC | PRN

## 2024-07-27 MED ORDER — DICLOFENAC SODIUM 1 % EX GEL
4.0000 g | Freq: Four times a day (QID) | CUTANEOUS | Status: DC
  Administered 2024-07-27 – 2024-07-29 (×10): 4 g via TOPICAL
  Filled 2024-07-27: qty 100

## 2024-07-27 MED ORDER — SODIUM CHLORIDE 0.9 % IV SOLN: INTRAVENOUS | Status: DC

## 2024-07-27 MED ORDER — LIDOCAINE 5 % EX PTCH
1.0000 | MEDICATED_PATCH | CUTANEOUS | Status: DC
  Administered 2024-07-27 – 2024-07-29 (×3): 1 via TRANSDERMAL
  Filled 2024-07-27 (×3): qty 1

## 2024-07-27 NOTE — Progress Notes (Signed)
 Progress Note   Patient: Jacqueline Yoder FMW:[id number] DOB: 12/06/1945 DOA: 07/24/2024     2 DOS: the patient was seen and examined on 07/27/2024   Brief hospital course: 78 y.o. female with medical history significant for  HTN, HLD, and rectal cancer with lung and bony metastases admitted for SBO in July 2025 which resolved spontaneously being admitted with SBO with concern for esophageal perforation with pneumomediastinum on CT.  Transfer was attempted to [facility] however CT surgery recommended [facility] to [facility], but [facility] said that surgery should be consulted to address the SBO.  (Please see ED provider's note ).  Surgeon, Dr. Jordis was ultimately consulted.  After discussion with family it was reported that patient was placed comfort care and hospitalist consulted for admission.  In speaking with patient as well as daughter and POA at bedside along with another daughter and son also at bedside, family stated that patient is DNR/DNI and does not want surgery but they are interested in conservative treatment and after explaining comfort care, they are not yet ready for comfort care. In the ED patient remained tachycardic and tachypneic but afebrile. WBC was normal with lactic acid 2.1, electrolyte abnormalities of hyponatremia and mild hyperkalemia and normal creatinine.  Urinalysis was consistent with UTI. CT abdomen and pelvis showing SBO with possible small esophageal perforation with pneumoperitoneum Patient was started on Zosyn, LR Patient being admitted to the hospitalist service Family is open to a palliative care consult   10/10.  General surgery Dr. Jordis spoke with family about not doing surgery.  Patient not in any pain or having any nausea.  Admitted with bowel obstruction and esophageal perforation.  Hospice consulted. 10/11.  Patient having some abdominal pain.  Still no gas passed. 10/12.  Patient had bowel movement through her ostomy.  Will start on liquid diet.  Assessment and  Plan: * Esophageal perforation General Surgery spoke with family and decided not to do surgery on 10/10.  Trial of clear liquid diet.  Empiric antibiotic.  SBO (small bowel obstruction) (HCC) Patient had a bowel movement.  Trial of clear liquid diet.  No nausea or vomiting.  Lactic acidosis Secondary to esophageal perforation and/or small bowel obstruction.  Colon cancer metastasized to lung [facility]) And bone.  Family signed up for hospice last week.  Patient is a DO NOT RESUSCITATE.  Hyponatremia Sodium 134  Essential hypertension Holding medication  Dyslipidemia Holding meds  Acute cystitis with hematuria Urine culture growing multiple organisms.  The Zosyn that she is on would cover.  Right groin wound Appreciate wound care consultation.  Cleanse with saline, pat dry the peri-wound skin. Apply alginate #[id number] to the wound bed, cover with gauze or foam dressing. Change daily. Addendum: before remove the alginate, moisten with saline, the dressing will become a gelatinous and will come off easily.  Decubitus ulcer of coccyx, stage 2 (HCC) Wound 05/04/24 2100 Pressure Injury Coccyx Mid Stage 2 -  Partial thickness loss of dermis presenting as a shallow open injury with a red, pink wound bed without slough. (Active)     Bilateral shoulder pain Lidoderm  patch alternating shoulders.  Voltaren  gel alternating shoulders.  Hypokalemia IV potassium      Subjective: Patient has some abdominal discomfort.  Had bowel movement through her ostomy.  Will start on liquid diet.  Admitted with esophageal perforation and small bowel obstruction  Physical Exam: Vitals:   07/26/24 2004 07/26/24 2351 07/27/24 0421 07/27/24 0745  BP: (!) 127/48 (!) 124/57 (!) 106/48 ROLLEN)  109/50  Pulse: (!) 103 (!) 102 95 96  Resp: (!) 23 12 16 15   Temp: 99.1 F (37.3 C) 97.8 F (36.6 C)  98 F (36.7 C)  TempSrc:    Axillary  SpO2: 91% 99% 98% 98%  Weight:      Height:       Physical Exam HENT:      Head: Normocephalic.  Eyes:     General: Lids are normal.  Cardiovascular:     Rate and Rhythm: Normal rate and regular rhythm.     Heart sounds: Normal heart sounds, S1 normal and S2 normal.  Pulmonary:     Breath sounds: Examination of the right-lower field reveals decreased breath sounds. Examination of the left-lower field reveals decreased breath sounds. Decreased breath sounds present. No wheezing, rhonchi or rales.  Abdominal:     General: Bowel sounds are decreased.     Palpations: Abdomen is soft.     Tenderness: There is no abdominal tenderness.     Comments: Stool in ostomy bag  Musculoskeletal:     Right lower leg: No swelling.     Left lower leg: No swelling.  Skin:    General: Skin is warm.     Findings: No rash.  Neurological:     Mental Status: She is alert.     Data Reviewed: Sodium 134, potassium 3.4, phosphorus 3.1, magnesium  1.8  Family Communication: Family at bedside  Disposition: Status is: Inpatient Remains inpatient appropriate because: Since patient has stool in the ostomy I will start liquid diet.  Planned Discharge Destination: Family thinking about hospice at home versus facility.    Time spent: 28 minutes  Author: Charlie Patterson, MD 07/27/2024 1:58 PM  For on call review [email or web address].

## 2024-07-27 NOTE — Progress Notes (Signed)
 [facility] ([facility]) Hospitalized Hospice Patient   [name] is a current hospice patient followed at home for terminal diagnosis of Cancer of the rectum, Anus and anal canal who presented to the ED with diagnosis of esophageal perforation with pneumomediastinum & SBO.  Per Dr. Norleen Laurence, hospice MD, this is a covered hospitalization.   [name] was moved to room 205.  Larger room to accommodate all of patient's family/visitors.  She is resting quietly in bed.  States she has had some pain in both her shoulders and abdomen.  She has been advanced to a clear liquid diet., which she is tolerating well.  Patient/family have been told by Dr. Josette- that he will keep her on doses of IV antibiotics beyond the UTI.  Family is encouraged by this.  Patient has received morphine  for her pain.    [name] is appropriate for GIP level of care requiring IV antibiotics and skilled care for current diagnosis of SBO and esophageal perforation.   Vital Signs:   T 98 Axillary, BP 109/50, P 15, R 15, Oxi 98% RA   Abnormal Labs: Na 134, K 3.4, Phosphorus 3.1, Mag 1.8   I&O:  None recorded    Diagnostics:  No new    IV Meds/PRN Zosyn 3.375 IVPB every 8 hours Morphine  IV 2mg  prn-  had 2mg  @ 0137 & 1246 NSCl infusion @ 65ml/hour   Assessment and Plan:  per Dr. Josette progress note 10.12.25  Esophageal perforation General Surgery spoke with family and decided not to do surgery on 10/10.  Trial of clear liquid diet.  Empiric antibiotic.   SBO (small bowel obstruction) (HCC) Patient had a bowel movement.  Trial of clear liquid diet.  No nausea or vomiting.   Lactic acidosis Secondary to esophageal perforation and/or small bowel obstruction.   Colon cancer metastasized to lung [facility]) And bone.  Family signed up for hospice last week.  Patient is a DO NOT RESUSCITATE.   Hyponatremia Sodium 134   Essential hypertension Holding medication   Dyslipidemia Holding meds    Acute cystitis with hematuria Urine culture growing multiple organisms.  The Zosyn that she is on would cover.   Right groin wound Appreciate wound care consultation.   Cleanse with saline, pat dry the peri-wound skin. Apply alginate #[id number] to the wound bed, cover with gauze or foam dressing. Change daily. Addendum: before remove the alginate, moisten with saline, the dressing will become a gelatinous and will come off easily.   Decubitus ulcer of coccyx, stage 2 (HCC) Wound 05/04/24 2100 Pressure Injury Coccyx Mid Stage 2 -  Partial thickness loss of dermis presenting as a shallow open injury with a red, pink wound bed without slough. (Active)        Bilateral shoulder pain Lidoderm  patch alternating shoulders.  Voltaren  gel alternating shoulders.   Hypokalemia IV potassium   ______________________________________   Discharge planning- ongoing  IDT- Updated   Goals of care-  DNR clear- see above.   Family contact- multiple family members at bedside.   Please do not hesitate to call for any hospice related questions or concerns.   Saddie HILARIO Na, RN Nurse Liaison [phone]

## 2024-07-27 NOTE — Assessment & Plan Note (Signed)
 Will receive K-Phos today

## 2024-07-27 NOTE — Assessment & Plan Note (Signed)
 Lidoderm  patch alternating shoulders.  Voltaren  gel alternating shoulders.

## 2024-07-27 NOTE — Consult Note (Signed)
 PHARMACY CONSULT NOTE - ELECTROLYTES  Pharmacy Consult for Electrolyte Monitoring and Replacement   Recent Labs: Potassium (mmol/L)  Date Value  07/26/2024 3.8   Magnesium  (mg/dL)  Date Value  97/81/7974 2.0   Calcium  (mg/dL)  Date Value  89/88/7974 8.5 (L)   Albumin (g/dL)  Date Value  89/89/7974 2.3 (L)   Phosphorus (mg/dL)  Date Value  87/94/7975 2.1 (L)   Sodium (mmol/L)  Date Value  07/26/2024 129 (L)   Height: 5' 4 (162.6 cm) Weight: 73 kg (161 lb) IBW/kg (Calculated) : 54.7 Estimated Creatinine Clearance: 57.6 mL/min (by C-G formula based on SCr of 0.72 mg/dL).  Assessment  Jacqueline Yoder is a 78 y.o. female presenting with esophageal perforation. PMH significant for HTN, HLD, and rectal cancer with lung and bony metastases admitted for SBO in July 2025 which resolved spontaneously . Pharmacy has been consulted to monitor and replace electrolytes.  Diet: NPO, sips w/ meds MIVF: NS @ 75 mL/hr Pertinent medications: N/A  Goal of Therapy: Electrolytes within normal limits  Plan:  Sodium improving with fluids Continue to monitor  Thank you for allowing pharmacy to be a part of this patient's care.  Marolyn KATHEE Mare 07/27/2024 6:56 AM

## 2024-07-28 DIAGNOSIS — J982 Interstitial emphysema: Secondary | ICD-10-CM

## 2024-07-28 DIAGNOSIS — K223 Perforation of esophagus: Secondary | ICD-10-CM | POA: Diagnosis not present

## 2024-07-28 DIAGNOSIS — E8729 Other acidosis: Secondary | ICD-10-CM | POA: Insufficient documentation

## 2024-07-28 DIAGNOSIS — K56609 Unspecified intestinal obstruction, unspecified as to partial versus complete obstruction: Secondary | ICD-10-CM | POA: Diagnosis not present

## 2024-07-28 DIAGNOSIS — E872 Acidosis, unspecified: Secondary | ICD-10-CM | POA: Diagnosis not present

## 2024-07-28 DIAGNOSIS — E876 Hypokalemia: Secondary | ICD-10-CM

## 2024-07-28 DIAGNOSIS — C189 Malignant neoplasm of colon, unspecified: Secondary | ICD-10-CM | POA: Diagnosis not present

## 2024-07-28 LAB — RENAL FUNCTION PANEL
Albumin: 2 g/dL — ABNORMAL LOW (ref 3.5–5.0)
Anion gap: 18 — ABNORMAL HIGH (ref 5–15)
BUN: 14 mg/dL (ref 8–23)
CO2: 21 mmol/L — ABNORMAL LOW (ref 22–32)
Calcium: 8.6 mg/dL — ABNORMAL LOW (ref 8.9–10.3)
Chloride: 99 mmol/L (ref 98–111)
Creatinine, Ser: 0.56 mg/dL (ref 0.44–1.00)
GFR, Estimated: >60 mL/min (ref >60)
Glucose, Bld: 56 mg/dL — ABNORMAL LOW (ref 70–99)
Phosphorus: 2.7 mg/dL (ref 2.5–4.6)
Potassium: 3.5 mmol/L (ref 3.5–5.1)
Sodium: 138 mmol/L (ref 135–145)

## 2024-07-28 LAB — MAGNESIUM: Magnesium: 2 mg/dL (ref 1.7–2.4)

## 2024-07-28 MED ORDER — SODIUM CHLORIDE 0.9 % IV SOLN: INTRAVENOUS | Status: DC

## 2024-07-28 MED ORDER — KCL IN DEXTROSE-NACL 20-5-0.9 MEQ/L-%-% IV SOLN
INTRAVENOUS | Status: AC
  Filled 2024-07-28 (×3): qty 1000

## 2024-07-28 NOTE — Assessment & Plan Note (Signed)
 Resolved with IV fluid

## 2024-07-28 NOTE — Care Management Important Message (Signed)
 Important Message  Patient Details  Name: Jacqueline Yoder MRN: [id number] Date of Birth: [birth year]   Important Message Given:  Yes - Medicare IM     Jacqueline Yoder 07/28/2024, 3:40 PM

## 2024-07-28 NOTE — Plan of Care (Signed)
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
## 2024-07-28 NOTE — Progress Notes (Signed)
 Progress Note   Patient: Jacqueline Yoder FMW:[id number] DOB: 15-Apr-1946 DOA: 07/24/2024     3 DOS: the patient was seen and examined on 07/28/2024   Brief hospital course: 78 y.o. female with medical history significant for  HTN, HLD, and rectal cancer with lung and bony metastases admitted for SBO in July 2025 which resolved spontaneously being admitted with SBO with concern for esophageal perforation with pneumomediastinum on CT.  Transfer was attempted to [facility] however CT surgery recommended transfer to [facility], but [facility] said that surgery should be consulted to address the SBO.  (Please see ED provider's note ).  Surgeon, Dr. Jordis was ultimately consulted.  After discussion with family it was reported that patient was placed comfort care and hospitalist consulted for admission.  In speaking with patient as well as daughter and POA at bedside along with another daughter and son also at bedside, family stated that patient is DNR/DNI and does not want surgery but they are interested in conservative treatment and after explaining comfort care, they are not yet ready for comfort care. In the ED patient remained tachycardic and tachypneic but afebrile. WBC was normal with lactic acid 2.1, electrolyte abnormalities of hyponatremia and mild hyperkalemia and normal creatinine.  Urinalysis was consistent with UTI. CT abdomen and pelvis showing SBO with possible small esophageal perforation with pneumoperitoneum Patient was started on Zosyn, LR Patient being admitted to the hospitalist service Family is open to a palliative care consult   10/10.  General surgery Dr. Jordis spoke with family about not doing surgery.  Patient not in any pain or having any nausea.  Admitted with bowel obstruction and esophageal perforation.  Hospice consulted. 10/11.  Patient having some abdominal pain.  Still no gas passed. 10/12.  Patient had bowel movement through her ostomy.  Will start on liquid diet.  Assessment and  Plan: * Esophageal perforation General Surgery spoke with family and decided not to do surgery on 10/10.  Trial of full liquid diet and advance to soft food for tomorrow.  Empiric antibiotic.  SBO (small bowel obstruction) (HCC) Patient had a bowel movement.  Trial of full liquid diet.  No nausea or vomiting.  Lactic acidosis Secondary to esophageal perforation and/or small bowel obstruction.  Colon cancer metastasized to lung [facility]) And bone.  Family signed up for hospice last week.  Patient is a DO NOT RESUSCITATE.  Plan will be to go home with hospice potentially tomorrow.  Hyponatremia Sodium 138  Essential hypertension Holding medication  Dyslipidemia Holding meds  Increased anion gap metabolic acidosis Continue IV fluid.  Will check lactic acid again tomorrow.  Acute cystitis with hematuria Urine culture growing multiple organisms.  The Zosyn that she is on would cover.  Right groin wound Appreciate wound care consultation.  Cleanse with saline, pat dry the peri-wound skin. Apply alginate #[id number] to the wound bed, cover with gauze or foam dressing. Change daily. Addendum: before remove the alginate, moisten with saline, the dressing will become a gelatinous and will come off easily.  Decubitus ulcer of coccyx, stage 2 (HCC) Wound 05/04/24 2100 Pressure Injury Coccyx Mid Stage 2 -  Partial thickness loss of dermis presenting as a shallow open injury with a red, pink wound bed without slough. (Active)     Bilateral shoulder pain Lidoderm  patch alternating shoulders.  Voltaren  gel alternating shoulders.  Hypokalemia Replace in IV fluid        Subjective: Patient had a little abdominal pain.  Passing stool.  Admitted with  esophageal rupture and small bowel obstruction.  Physical Exam: Vitals:   07/27/24 1543 07/27/24 2044 07/28/24 0517 07/28/24 0731  BP: (!) 117/56 (!) 119/55 (!) 121/55 (!) 116/49  Pulse: 97 96 100 95  Resp: 18 16 17 20   Temp:  99 F (37.2  C) 98.1 F (36.7 C) 98 F (36.7 C)  TempSrc:  Oral Oral   SpO2: 100% 100% 100% 100%  Weight:      Height:       Physical Exam HENT:     Head: Normocephalic.  Eyes:     General: Lids are normal.  Cardiovascular:     Rate and Rhythm: Normal rate and regular rhythm.     Heart sounds: Normal heart sounds, S1 normal and S2 normal.  Pulmonary:     Breath sounds: Examination of the right-lower field reveals decreased breath sounds. Examination of the left-lower field reveals decreased breath sounds. Decreased breath sounds present. No wheezing, rhonchi or rales.  Abdominal:     General: Bowel sounds are decreased.     Palpations: Abdomen is soft.     Tenderness: There is no abdominal tenderness.     Comments: Stool in ostomy bag  Musculoskeletal:     Right lower leg: No swelling.     Left lower leg: No swelling.  Skin:    General: Skin is warm.     Findings: No rash.  Neurological:     Mental Status: She is alert.     Data Reviewed: Sodium 138, potassium 3.5, creatinine 0.56, anion gap 18  Family Communication: Family at bedside  Disposition: Status is: Inpatient Remains inpatient appropriate because: Will advance to full liquid diet today and soft diet tomorrow.  Planned Discharge Destination: Home with hospice    Time spent: 28 minutes  Author: Charlie Patterson, MD 07/28/2024 2:33 PM  For on call review [email or web address].

## 2024-07-28 NOTE — Consult Note (Signed)
 Pharmacy Electrolyte Replacement  Recent Labs:  Recent Labs    07/28/24 0803  K 3.5  MG 2.0  PHOS 2.7  CREATININE 0.56   Assessment  Jacqueline Yoder. Vessel is a 78 year old female with a history of hypertension, hyperlipidemia, and metastatic rectal cancer (to lung and bone), who presented with an esophageal perforation. She was previously hospitalized in July 2025 for a small bowel obstruction that resolved spontaneously. Pharmacy is consulted to monitor and manage electrolyte abnormalities. Over the past several days, sodium has improved from 127 ? 138 mmol/L, while potassium remains on the low-normal range (K 3.5 mmol/L).   Diet: NPO, sips w/ meds MIVF: NS @ 75 mL/hr Pertinent medications: N/A   Goal of Therapy: Electrolytes within normal limits   Plan:  Sodium improving with fluids Continue to monitor as electrolytes are normalizing.      Fredia Main, PharmD Student

## 2024-07-28 NOTE — Progress Notes (Addendum)
 [facility] Room205 [facility] Hospitalized Hospice Patient   [name]. Jacqueline Yoder is a current hospice patient followed at home for terminal diagnosis of Cancer of the rectum, Anus and anal canal who presented to the ED with diagnosis of esophageal perforation with pneumomediastinum & SBO.  Per Dr. Norleen Laurence, hospice MD, this is a covered hospitalization.   [name] observed lying in bed with 02 in place.  Several family members present.  Attending present, as well, to answer questions for family.  DC possible in the next couple of days.       [name] is appropriate for GIP level of care requiring IV antibiotics and skilled care for current diagnosis of SBO and esophageal perforation.   Vital Signs:   T 98.0 Axillary, BP 111/49, P 95, R 20, Oxi 100% on 2L/min    Abnormal Labs: C02-21,  Glucose 56, Calcium  8.6, Anion Gap 18, Albumin 2.0   I&O: 1725.04/1049    Diagnostics:  No new    IV Meds/PRN Zosyn 3.375 IVPB every 8 hours Morphine  IV 2mg  prn-  had 2mg  x4 NSCl infusion @ 49ml/hour   Assessment and Plan:  per Dr. Josette progress note 10.13.25   Esophageal perforation General Surgery spoke with family and decided not to do surgery on 10/10.  Trial of full liquid diet and advance to soft food for tomorrow.  Empiric antibiotic.   SBO (small bowel obstruction) (HCC) Patient had a bowel movement.  Trial of full liquid diet.  No nausea or vomiting.   Lactic acidosis Secondary to esophageal perforation and/or small bowel obstruction.   Colon cancer metastasized to lung [facility]) And bone.  Family signed up for hospice last week.  Patient is a DO NOT RESUSCITATE.  Plan will be to go home with hospice potentially tomorrow.   Hyponatremia Sodium 138   Essential hypertension Holding medication   Dyslipidemia Holding meds   Increased anion gap metabolic acidosis Continue IV fluid.  Will check lactic acid again tomorrow.   Acute cystitis with hematuria Urine culture  growing multiple organisms.  The Zosyn that she is on would cover.   Right groin wound Appreciate wound care consultation.   Cleanse with saline, pat dry the peri-wound skin. Apply alginate #[id number] to the wound bed, cover with gauze or foam dressing. Change daily. Addendum: before remove the alginate, moisten with saline, the dressing will become a gelatinous and will come off easily.   Decubitus ulcer of coccyx, stage 2 (HCC) Wound 05/04/24 2100 Pressure Injury Coccyx Mid Stage 2 -  Partial thickness loss of dermis presenting as a shallow open injury with a red, pink wound bed without slough. (Active)        Bilateral shoulder pain Lidoderm  patch alternating shoulders.  Voltaren  gel alternating shoulders.   Hypokalemia Replace in IV fluid ______________________________________   Discharge planning- DC home in the next day or two with continued Hospice follow up.  Air mattress and home 02 will be delivered to the home.    IDT- Updated   Goals of care-  DNR clear- see above.   Family contact- multiple family members at bedside, along with attending MD.     Please do not hesitate to call for any hospice related questions or concerns.  [facility] Liaison [phone]

## 2024-07-28 NOTE — IPAL (Signed)
  Interdisciplinary Goals of Care Family Meeting   Date carried out: 07/28/2024  Location of the meeting: Bedside  Member's involved: Hospice liaison myself and family and patient.  Durable Power of Insurance risk surveyor: Patient and family  Discussion: We discussed goals of care for Jacqueline Yoder .    Code status:   Code Status: Limited: Do not attempt resuscitation (DNR) -DNR-LIMITED -Do Not Intubate/DNI    Disposition: Will be transitioning to hospice at home.  The hospice liaison explained their services and will order a bed and oxygen for home.  I will advance her diet to full liquid diet today and dysphagia to diet tomorrow.  If she does okay with the advancement of diet should be able to go home with hospice tomorrow once equipment is delivered.  Patient admitted with esophageal perforation and small bowel obstruction.  It was decided for no surgical intervention.  Patient moving her bowels and no pain with swallowing the liquids.  Patient's family nervous about how things will proceed at home.  We tried to answer all questions to the best of our ability.  There is always a person on-call for hospice where they can reach for questions at any time.  If they feel uncomfortable taking care of her at home can always call to be upgraded to the hospice facility.  Time spent for the meeting: 30 minutes  Case discussed with hospice liaison before and after meeting.  Charlie Patterson, MD  07/28/2024, 2:22 PM

## 2024-07-29 ENCOUNTER — Other Ambulatory Visit: Payer: Self-pay | Admitting: Family Medicine

## 2024-07-29 ENCOUNTER — Other Ambulatory Visit: Payer: Self-pay | Admitting: Oncology

## 2024-07-29 DIAGNOSIS — C189 Malignant neoplasm of colon, unspecified: Secondary | ICD-10-CM | POA: Diagnosis not present

## 2024-07-29 DIAGNOSIS — E872 Acidosis, unspecified: Secondary | ICD-10-CM | POA: Diagnosis not present

## 2024-07-29 DIAGNOSIS — R52 Pain, unspecified: Secondary | ICD-10-CM

## 2024-07-29 DIAGNOSIS — K56609 Unspecified intestinal obstruction, unspecified as to partial versus complete obstruction: Secondary | ICD-10-CM | POA: Diagnosis not present

## 2024-07-29 DIAGNOSIS — K223 Perforation of esophagus: Secondary | ICD-10-CM | POA: Diagnosis not present

## 2024-07-29 DIAGNOSIS — I129 Hypertensive chronic kidney disease with stage 1 through stage 4 chronic kidney disease, or unspecified chronic kidney disease: Secondary | ICD-10-CM

## 2024-07-29 LAB — RENAL FUNCTION PANEL
Albumin: 2.1 g/dL — ABNORMAL LOW (ref 3.5–5.0)
Anion gap: 8 (ref 5–15)
BUN: 8 mg/dL (ref 8–23)
CO2: 25 mmol/L (ref 22–32)
Calcium: 8.4 mg/dL — ABNORMAL LOW (ref 8.9–10.3)
Chloride: 103 mmol/L (ref 98–111)
Creatinine, Ser: 0.33 mg/dL — ABNORMAL LOW (ref 0.44–1.00)
GFR, Estimated: >60 mL/min (ref >60)
Glucose, Bld: 100 mg/dL — ABNORMAL HIGH (ref 70–99)
Phosphorus: 2.6 mg/dL (ref 2.5–4.6)
Potassium: 3.5 mmol/L (ref 3.5–5.1)
Sodium: 136 mmol/L (ref 135–145)

## 2024-07-29 LAB — CULTURE, BLOOD (ROUTINE X 2)
Culture: NO GROWTH
Culture: NO GROWTH
Special Requests: ADEQUATE
Special Requests: ADEQUATE

## 2024-07-29 LAB — MAGNESIUM: Magnesium: 1.8 mg/dL (ref 1.7–2.4)

## 2024-07-29 LAB — LACTIC ACID, PLASMA: Lactic Acid, Venous: 1 mmol/L (ref 0.5–1.9)

## 2024-07-29 MED ORDER — ONDANSETRON HCL 4 MG/2ML IJ SOLN: 4.0000 mg | Freq: Four times a day (QID) | INTRAMUSCULAR | Status: DC | PRN

## 2024-07-29 MED ORDER — MORPHINE SULFATE (CONCENTRATE) 10 MG /0.5 ML PO SOLN: 10.0000 mg | ORAL | Status: DC | PRN

## 2024-07-29 MED ORDER — MORPHINE SULFATE (CONCENTRATE) 10 MG /0.5 ML PO SOLN
5.0000 mg | ORAL | Status: DC | PRN
  Administered 2024-07-29: 5 mg via ORAL
  Filled 2024-07-29: qty 0.5

## 2024-07-29 MED ORDER — GABAPENTIN 100 MG PO CAPS: 100.0000 mg | ORAL_CAPSULE | Freq: Every day | ORAL | Status: DC

## 2024-07-29 MED ORDER — MORPHINE SULFATE (PF) 2 MG/ML IV SOLN
2.0000 mg | INTRAVENOUS | Status: DC | PRN
  Administered 2024-07-29: 2 mg via INTRAVENOUS
  Filled 2024-07-29: qty 1

## 2024-07-29 MED ORDER — ENSURE PLUS HIGH PROTEIN PO LIQD: 237.0000 mL | Freq: Two times a day (BID) | ORAL | Status: DC

## 2024-07-29 MED ORDER — MORPHINE SULFATE (PF) 2 MG/ML IV SOLN: 2.0000 mg | INTRAVENOUS | Status: DC | PRN

## 2024-07-29 MED ORDER — LIDOCAINE 5 % EX PTCH: MEDICATED_PATCH | CUTANEOUS | Status: DC

## 2024-07-29 MED ORDER — DICLOFENAC SODIUM 1 % EX GEL: CUTANEOUS | Status: DC

## 2024-07-29 MED ORDER — ENSURE PLUS HIGH PROTEIN PO LIQD
237.0000 mL | Freq: Two times a day (BID) | ORAL | Status: DC
  Administered 2024-07-29: 237 mL via ORAL

## 2024-07-29 MED ORDER — OXYCODONE HCL 5 MG PO TABS
10.0000 mg | ORAL_TABLET | Freq: Four times a day (QID) | ORAL | Status: DC | PRN
  Filled 2024-07-29: qty 2

## 2024-07-29 NOTE — Progress Notes (Signed)
 Pt being discharged to [facility] care, VSS, Iv in place.  Adaysha Dubinsky C Warden, RN 07/29/2024 5:29 PM

## 2024-07-29 NOTE — Consult Note (Signed)
 PHARMACY CONSULT NOTE - FOLLOW UP  Pharmacy Consult for Electrolyte Monitoring and Replacement   Recent Labs: Potassium (mmol/L)  Date Value  07/29/2024 3.5   Magnesium  (mg/dL)  Date Value  89/85/7974 1.8   Calcium  (mg/dL)  Date Value  89/85/7974 8.4 (L)   Albumin (g/dL)  Date Value  89/85/7974 2.1 (L)   Phosphorus (mg/dL)  Date Value  89/85/7974 2.6   Sodium (mmol/L)  Date Value  07/29/2024 136     Assessment: Jacqueline Yoder is a 78 year old female with a history of hypertension, hyperlipidemia, and metastatic rectal cancer (to lung and bone), who presented with an esophageal perforation. She was previously hospitalized in July 2025 for a small bowel obstruction that resolved spontaneously. Pharmacy is consulted to monitor and manage electrolyte abnormalities. Sodium improved.   On D5NS w/ 20 mEq x 24 hours.   Goal of Therapy:  WNL  Plan:  Electrolytes remain stable. No replacement needed. Plan to transition to home with hospice. Pharmacy will sign off. Please re-consult if needed.   Cathaleen GORMAN Blanch ,PharmD Clinical Pharmacist 07/29/2024 7:53 AM

## 2024-07-29 NOTE — Discharge Summary (Signed)
 Physician Discharge Summary   Patient: Jacqueline Yoder MRN: [id number] DOB: 19-Jul-1946  Admit date:     07/24/2024  Discharge date: 07/29/24  Discharge Physician: Charlie Patterson   PCP: Edman Marsa PARAS, DO   Recommendations at discharge:   Discharged to hospice facility  Discharge Diagnoses: Principal Problem:   Esophageal perforation Active Problems:   Pain   SBO (small bowel obstruction) (HCC)   Colon cancer metastasized to lung (HCC)   Lactic acidosis   Hyponatremia   Essential hypertension   Dyslipidemia   Hypokalemia   Bilateral shoulder pain   Decubitus ulcer of coccyx, stage 2 (HCC)   Right groin wound   Cystitis   Increased anion gap metabolic acidosis   Pneumomediastinum [facility])    Hospital Course: 78 y.o. female with medical history significant for  HTN, HLD, and rectal cancer with lung and bony metastases admitted for SBO in July 2025 which resolved spontaneously being admitted with SBO with concern for esophageal perforation with pneumomediastinum on CT.  Transfer was attempted to [facility] however CT surgery recommended transfer to [facility], but [facility] said that surgery should be consulted to address the SBO.  (Please see ED provider's note ).  Surgeon, Dr. Jordis was ultimately consulted.  After discussion with family it was reported that patient was placed comfort care and hospitalist consulted for admission.  In speaking with patient as well as daughter and POA at bedside along with another daughter and son also at bedside, family stated that patient is DNR/DNI and does not want surgery but they are interested in conservative treatment and after explaining comfort care, they are not yet ready for comfort care. In the ED patient remained tachycardic and tachypneic but afebrile. WBC was normal with lactic acid 2.1, electrolyte abnormalities of hyponatremia and mild hyperkalemia and normal creatinine.  Urinalysis was consistent with UTI. CT abdomen and pelvis showing  SBO with possible small esophageal perforation with pneumoperitoneum Patient was started on Zosyn, LR Patient being admitted to the hospitalist service Family is open to a palliative care consult   10/10.  General surgery Dr. Jordis spoke with family about not doing surgery.  Patient not in any pain or having any nausea.  Admitted with bowel obstruction and esophageal perforation.  Hospice consulted. 10/11.  Patient having some abdominal pain.  Still no gas passed. 10/12.  Patient had bowel movement through her ostomy.  Will start on liquid diet. 10/13.  Will advance to full liquid diet 10/14.  Patient had a lot of pain this morning after being moving around and cleaned up.  Had to adjust pain medications.  Family has changed their mind and wanted to go to hospice facility instead of home with hospice.  They have availability at the hospice facility today and will discharge there today.  Assessment and Plan: * Esophageal perforation General Surgery spoke with family and decided not to do surgery on 10/10.  Try to advance to soft diet today.  Patient can have what ever diet she chooses whether it is liquid diet or soft diet.  Will discontinue antibiotics since patient going to the hospice facility.  Pain Worsening pain in the abdomen and shoulders this morning after being cleaned up.  Continue Roxanol and can use morphine  IV for breakthrough pain.  Add back gabapentin  at night.  SBO (small bowel obstruction) (HCC) Patient has had bowel movements.  Try to advance to solid food today.  No nausea or vomiting.  Lactic acidosis Secondary to esophageal perforation and/or small  bowel obstruction.  Colon cancer metastasized to lung [facility]) And bone.  Family signed up for hospice last week.  Patient is a DO NOT RESUSCITATE.    Hyponatremia Sodium 136  Essential hypertension Holding medication  Dyslipidemia Holding meds  Increased anion gap metabolic acidosis Resolved with IV  fluid  Cystitis Urine culture growing multiple organisms.  The Zosyn that she is on would cover.  Right groin wound Appreciate wound care consultation.  Cleanse with saline, pat dry the peri-wound skin. Apply alginate #[id number] to the wound bed, cover with gauze or foam dressing. Change daily. Addendum: before remove the alginate, moisten with saline, the dressing will become a gelatinous and will come off easily.  Decubitus ulcer of coccyx, stage 2 (HCC) Wound 05/04/24 2100 Pressure Injury Coccyx Mid Stage 2 -  Partial thickness loss of dermis presenting as a shallow open injury with a red, pink wound bed without slough. (Active)   Local wound care and cover with foam pad.  Bilateral shoulder pain Lidoderm  patch alternating shoulders.  Voltaren  gel alternating shoulders.  Hypokalemia Replaced in IV fluid         Consultants: General Surgery, palliative care, hospice Procedures performed: None Disposition: Hospice facility Diet recommendation:  Patient can have what ever she wants soft diet or liquid diet. DISCHARGE MEDICATION: Allergies as of 07/29/2024       Reactions   Sulfamethoxazole-trimethoprim Other (See Comments)   Also said she had chills and pressure in nose.        Medication List     STOP taking these medications    apixaban  2.5 MG Tabs tablet Commonly known as: Eliquis    Cinnamon 500 MG Tabs   diphenoxylate -atropine  2.5-0.025 MG tablet Commonly known as: LOMOTIL    fexofenadine 180 MG tablet Commonly known as: ALLEGRA   fluticasone  50 MCG/ACT nasal spray Commonly known as: FLONASE    ipratropium 0.03 % nasal spray Commonly known as: ATROVENT    magnesium  chloride 64 MG Tbec SR tablet Commonly known as: SLOW-MAG   metroNIDAZOLE  1 % gel Commonly known as: METROGEL    nystatin  powder Commonly known as: MYCOSTATIN /NYSTOP    ondansetron  8 MG tablet Commonly known as: ZOFRAN  Replaced by: ondansetron  4 MG/2ML Soln injection   ONE-A-DAY  WOMENS 50 PLUS PO   oxyCODONE  5 MG immediate release tablet Commonly known as: Oxy IR/ROXICODONE    polyethylene glycol powder 17 GM/SCOOP powder Commonly known as: GLYCOLAX/MIRALAX   potassium chloride  SA 20 MEQ tablet Commonly known as: KLOR-CON  M   senna-docusate 8.6-50 MG tablet Commonly known as: Senokot-S   simvastatin  40 MG tablet Commonly known as: ZOCOR    triamterene -hydrochlorothiazide  37.5-25 MG capsule Commonly known as: DYAZIDE    Vitamin D3 50 MCG (2000 UT) capsule   zinc  gluconate 50 MG tablet       TAKE these medications    acetaminophen  650 MG CR tablet Commonly known as: TYLENOL  Take 500 mg by mouth every 8 (eight) hours as needed for pain.   diclofenac  Sodium 1 % Gel Commonly known as: VOLTAREN  To areas of pain   feeding supplement Liqd Take 237 mLs by mouth 2 (two) times daily between meals. Start taking on: July 30, 2024   gabapentin  100 MG capsule Commonly known as: NEURONTIN  Take 1 capsule (100 mg total) by mouth at bedtime. What changed:  how much to take how to take this when to take this additional instructions   lidocaine  5 % Commonly known as: LIDODERM  Remove & Discard patch within 12 hours or as directed by MD.  Apply  on shoulders alternating each day Start taking on: July 30, 2024   morphine  CONCENTRATE 10 mg / 0.5 ml concentrated solution Take 0.5 mLs (10 mg total) by mouth every 2 (two) hours as needed for moderate pain (pain score 4-6) or severe pain (pain score 7-10). What changed:  how much to take reasons to take this   morphine  (PF) 2 MG/ML injection Inject 1 mL (2 mg total) into the vein every 2 (two) hours as needed (breakthrough pain). What changed: You were already taking a medication with the same name, and this prescription was added. Make sure you understand how and when to take each.   ondansetron  4 MG/2ML Soln injection Commonly known as: ZOFRAN  Inject 2 mLs (4 mg total) into the vein every 6 (six)  hours as needed for nausea. Replaces: ondansetron  8 MG tablet               Durable Medical Equipment  (From admission, onward)           Start     Ordered   07/28/24 0806  For home use only DME Hospital bed  Once       Question Answer Comment  Length of Need Lifetime   The above medical condition requires: Patient requires the ability to reposition frequently   Bed type Semi-electric   Support Surface: Low Air loss Mattress      07/28/24 0806            Discharge Exam: Filed Weights   07/24/24 1230  Weight: 73 kg   Physical Exam HENT:     Head: Normocephalic.  Eyes:     General: Lids are normal.  Cardiovascular:     Rate and Rhythm: Normal rate and regular rhythm.     Heart sounds: Normal heart sounds, S1 normal and S2 normal.  Pulmonary:     Breath sounds: Examination of the right-lower field reveals decreased breath sounds. Examination of the left-lower field reveals decreased breath sounds. Decreased breath sounds present. No wheezing, rhonchi or rales.  Abdominal:     General: Bowel sounds are decreased.     Palpations: Abdomen is soft.     Tenderness: There is generalized abdominal tenderness.     Comments: Stool in ostomy bag  Musculoskeletal:     Right lower leg: No swelling.     Left lower leg: No swelling.  Skin:    General: Skin is warm.     Findings: No rash.  Neurological:     Mental Status: She is alert.      Condition at discharge: fair  The results of significant diagnostics from this hospitalization (including imaging, microbiology, ancillary and laboratory) are listed below for reference.   Imaging Studies: CT ABDOMEN PELVIS W CONTRAST Result Date: 07/24/2024 CLINICAL DATA:  Provided history: Bowel obstruction suspected Abdominal pain for 2 days. Patient reports no bowel movement since Tuesday. History of metastatic colon cancer. On hospice. EXAM: CT ABDOMEN AND PELVIS WITH CONTRAST TECHNIQUE: Multidetector CT imaging of the  abdomen and pelvis was performed using the standard protocol following bolus administration of intravenous contrast. RADIATION DOSE REDUCTION: This exam was performed according to the departmental dose-optimization program which includes automated exposure control, adjustment of the mA and/or kV according to patient size and/or use of iterative reconstruction technique. CONTRAST:  80mL OMNIPAQUE  IOHEXOL  300 MG/ML  SOLN COMPARISON:  CT 05/04/2024 FINDINGS: Lower chest: Pneumomediastinum in the lower thorax with air surrounding the esophagus pulmonary metastatic disease with innumerable pulmonary nodules, increased  in size and number from prior exam. Small bilateral pleural effusions and compressive atelectasis, new. Hepatobiliary: No evidence of focal liver lesion. Stable biliary tree post cholecystectomy. Pancreas: No ductal dilatation or inflammation. Spleen: Stable cyst in the inferior spleen.  No splenomegaly. Adrenals/Urinary Tract: No adrenal nodule. No hydronephrosis. Multiple renal cysts. No further follow-up imaging is recommended. Partially distended urinary bladder, normal for degree of distension. Stomach/Bowel: Pneumomediastinum adjacent to the distal esophagus extending to the gastroesophageal junction. The stomach is prominently fluid distended. Dilated fluid-filled proximal small bowel with transition point in the central lower abdomen/pelvis, series 5, image 19 and series 2, image 58. The more distal small bowel is decompressed. No small bowel pneumatosis. The appendix is normal. Left-sided colostomy. Parastomal hernia contains a short segment of transverse colon, but no wall thickening or obstruction. Moderate colonic stool burden. Colonic diverticulosis without diverticulitis. Prior abdominal perineal resection. Vascular/Lymphatic: Aortic atherosclerosis. No aneurysm. The portal, splenic, and mesenteric veins are patent. Lobulated soft tissue density posterior to the aorta at the level of the  kidneys may represent retroperitoneal adenopathy or soft tissue component of subjacent bone lesion, series 2, image 36. Right inguinal adenopathy, 13 mm right inguinal node, series 2, image 81 and external iliac adenopathy series 2, image 68. Right inguinal soft tissue density occupies the expected location of the right femoral vein, series 2, image 74. Reproductive: Hysterectomy. Other: Pneumomediastinum in the lower thorax. No free intra-abdominal air. Small volume pelvic ascites. There is marked body wall edema with confluent fluid in the flanks. Musculoskeletal: Heterogeneous enlargement of the right obturator musculature, series 2, image 82, nonspecific but possibly due to metastatic infiltration new superior endplate compression deformity of L3 with adjacent soft tissue thickening anteriorly. New compression deformity of T12 suspected multiple lytic lesions throughout the osseous structures, although osteoporosis limits assessment. IMPRESSION: 1. Small bowel obstruction with transition point in the central lower abdomen/pelvis. 2. Pneumomediastinum in the lower thorax with air surrounding the distal esophagus and gastroesophageal junction. This is suspicious for esophageal perforation. 3. Progression of pulmonary metastatic disease with innumerable pulmonary nodules, increased in size and number from prior exam. 4. New small bilateral pleural effusions and compressive atelectasis. 5. New compression deformities of T12 and L3, likely pathologic. 6. Right inguinal and external iliac adenopathy. Lobulated soft tissue density posterior to the aorta at the level of the kidneys may represent retroperitoneal adenopathy or soft tissue component of subjacent bone lesion. 7. Heterogeneous enlargement of the right obturator musculature, nonspecific but possibly due to metastatic infiltration. 8. Marked body wall edema with free fluid in the flanks. 9. Left-sided colostomy with parastomal hernia containing a short segment  of transverse colon, but no wall thickening or obstruction. Aortic Atherosclerosis (ICD10-I70.0). Critical Value/emergent results were called by telephone at the time of interpretation on 07/24/2024 at 2:47 pm to provider PHILLIP STAFFORD , who verbally acknowledged these results. Electronically Signed   By: Andrea Gasman M.D.   On: 07/24/2024 14:47   DG Chest Port 1 View Result Date: 07/24/2024 CLINICAL DATA:  Questionable sepsis, metastatic colon cancer EXAM: PORTABLE CHEST 1 VIEW COMPARISON:  04/10/2024 FINDINGS: The heart size and mediastinal contours are within normal limits. Numerous bilateral nodular pulmonary metastases. Sclerotic osseous metastasis of the proximal right humerus. IMPRESSION: 1. Numerous bilateral nodular pulmonary metastases. No focal airspace opacity. CT may be helpful to assess for acutely superimposed airspace opacity. 2. Sclerotic osseous metastasis of the proximal right humerus. Electronically Signed   By: Marolyn JONETTA Jaksch M.D.   On: 07/24/2024 14:19  Microbiology: Results for orders placed or performed during the hospital encounter of 07/24/24  Blood Culture (routine x 2)     Status: None   Collection Time: 07/24/24  1:16 PM   Specimen: BLOOD  Result Value Ref Range Status   Specimen Description BLOOD BLOOD RIGHT ARM  Final   Special Requests   Final    BOTTLES DRAWN AEROBIC AND ANAEROBIC Blood Culture adequate volume   Culture   Final    NO GROWTH 5 DAYS Performed at [facility], [address] [place], [place] [address]    Report Status 07/29/2024 FINAL  Final  Resp panel by RT-PCR (RSV, Flu A&B, Covid) Anterior Nasal Swab     Status: None   Collection Time: 07/24/24  1:16 PM   Specimen: Anterior Nasal Swab  Result Value Ref Range Status   SARS Coronavirus 2 by RT PCR NEGATIVE NEGATIVE Final    Comment: (NOTE) SARS-CoV-2 target nucleic acids are NOT DETECTED.  The SARS-CoV-2 RNA is generally detectable in upper respiratory specimens during  the acute phase of infection. The lowest concentration of SARS-CoV-2 viral copies this assay can detect is 138 copies/mL. A negative result does not preclude SARS-Cov-2 infection and should not be used as the sole basis for treatment or other patient management decisions. A negative result may occur with  improper specimen collection/handling, submission of specimen other than nasopharyngeal swab, presence of viral mutation(s) within the areas targeted by this assay, and inadequate number of viral copies(<138 copies/mL). A negative result must be combined with clinical observations, patient history, and epidemiological information. The expected result is Negative.  Fact Sheet for Patients:  BloggerCourse.com  Fact Sheet for Healthcare Providers:  SeriousBroker.it  This test is no t yet approved or cleared by the [place]  FDA and  has been authorized for detection and/or diagnosis of SARS-CoV-2 by FDA under an Emergency Use Authorization (EUA). This EUA will remain  in effect (meaning this test can be used) for the duration of the COVID-19 declaration under Section 564(b)(1) of the Act, 21 U.S.C.section 360bbb-3(b)(1), unless the authorization is terminated  or revoked sooner.       Influenza A by PCR NEGATIVE NEGATIVE Final   Influenza B by PCR NEGATIVE NEGATIVE Final    Comment: (NOTE) The Xpert Xpress SARS-CoV-2/FLU/RSV plus assay is intended as an aid in the diagnosis of influenza from Nasopharyngeal swab specimens and should not be used as a sole basis for treatment. Nasal washings and aspirates are unacceptable for Xpert Xpress SARS-CoV-2/FLU/RSV testing.  Fact Sheet for Patients: BloggerCourse.com  Fact Sheet for Healthcare Providers: SeriousBroker.it  This test is not yet approved or cleared by the [place]  FDA and has been authorized for detection and/or  diagnosis of SARS-CoV-2 by FDA under an Emergency Use Authorization (EUA). This EUA will remain in effect (meaning this test can be used) for the duration of the COVID-19 declaration under Section 564(b)(1) of the Act, 21 U.S.C. section 360bbb-3(b)(1), unless the authorization is terminated or revoked.     Resp Syncytial Virus by PCR NEGATIVE NEGATIVE Final    Comment: (NOTE) Fact Sheet for Patients: BloggerCourse.com  Fact Sheet for Healthcare Providers: SeriousBroker.it  This test is not yet approved or cleared by the [place]  FDA and has been authorized for detection and/or diagnosis of SARS-CoV-2 by FDA under an Emergency Use Authorization (EUA). This EUA will remain in effect (meaning this test can be used) for the duration of the COVID-19 declaration under Section 564(b)(1) of the  Act, 21 U.S.C. section 360bbb-3(b)(1), unless the authorization is terminated or revoked.  Performed at [facility], [address] [place], [place] [address]   Blood Culture (routine x 2)     Status: None   Collection Time: 07/24/24  1:21 PM   Specimen: BLOOD  Result Value Ref Range Status   Specimen Description BLOOD BLOOD LEFT ARM  Final   Special Requests   Final    BOTTLES DRAWN AEROBIC AND ANAEROBIC Blood Culture adequate volume   Culture   Final    NO GROWTH 5 DAYS Performed at [facility], [address], [place], [place] [address]    Report Status 07/29/2024 FINAL  Final  Urine Culture     Status: Abnormal   Collection Time: 07/24/24  1:50 PM   Specimen: Urine, Random  Result Value Ref Range Status   Specimen Description   Final    URINE, RANDOM Performed at [facility], [address], [place], [place] [address]    Special Requests   Final    NONE Reflexed from [phone] Performed at [facility], [address] [place], [place] [address]    Culture (A)  Final    >=100,000  COLONIES/mL MULTIPLE SPECIES PRESENT, SUGGEST RECOLLECTION   Report Status 07/25/2024 FINAL  Final   *Note: Due to a large number of results and/or encounters for the requested time period, some results have not been displayed. A complete set of results can be found in Results Review.    Labs: CBC: Recent Labs  Lab 07/24/24 1316  WBC 6.1  NEUTROABS 4.9  HGB 12.1  HCT 35.9*  MCV 89.5  PLT 244   Basic Metabolic Panel: Recent Labs  Lab 07/25/24 0429 07/26/24 0510 07/27/24 0708 07/28/24 0803 07/29/24 0343  NA 127* 129* 134* 138 136  K 4.5 3.8 3.4* 3.5 3.5  CL 88* 92* 98 99 103  CO2 25 27 23  21* 25  GLUCOSE 149* 79 54* 56* 100*  BUN 18 17 14 14 8   CREATININE 0.67 0.72 0.63 0.56 0.33*  CALCIUM  8.9 8.5* 8.2* 8.6* 8.4*  MG  --   --  1.8 2.0 1.8  PHOS  --   --  3.1 2.7 2.6   Liver Function Tests: Recent Labs  Lab 07/24/24 1316 07/25/24 0429 07/28/24 0803 07/29/24 0343  AST 99* 64*  --   --   ALT 42 37  --   --   ALKPHOS 107 96  --   --   BILITOT 0.8 0.5  --   --   PROT 6.8 6.0*  --   --   ALBUMIN 2.7* 2.3* 2.0* 2.1*   CBG: No results for input(s): GLUCAP in the last 168 hours.  Discharge time spent: greater than 30 minutes.  Signed: Charlie Patterson, MD [facility] 07/29/2024

## 2024-07-29 NOTE — Progress Notes (Signed)
 Report called to hospice house, family aware, will medicate pt prior to transport.   Laneta JAYSON Rao, RN 07/29/2024 3:58 PM

## 2024-07-29 NOTE — Plan of Care (Signed)

## 2024-07-29 NOTE — Assessment & Plan Note (Addendum)
 Worsening pain in the abdomen and shoulders this morning after being cleaned up.  Continue Roxanol and can use morphine  IV for breakthrough pain.  Add back gabapentin  at night.

## 2024-07-29 NOTE — TOC Transition Note (Signed)
 Transition of Care [facility]) - Discharge Note   Patient Details  Name: Jacqueline Yoder MRN: [id number] Date of Birth: [birth year]  [facility] CM/SW Contact:  Corean ONEIDA Haddock, RN Phone Number: 07/29/2024, 3:56 PM   Clinical Narrative:      Per secure chat Hunter Seip with [facility] has coordinated admission to the hospice home today for pain management.  She has provided bedside Rn # to call report, and arranged transport EMS Packet printed to unit, and MD confirmed DNR signed       Patient Goals and CMS Choice            Discharge Placement                       Discharge Plan and Services Additional resources added to the After Visit Summary for                                       Social Drivers of Health (SDOH) Interventions SDOH Screenings   Food Insecurity: No Food Insecurity (07/25/2024)  Housing: Low Risk  (07/25/2024)  Transportation Needs: No Transportation Needs (07/25/2024)  Utilities: Not At Risk (07/25/2024)  Alcohol Screen: Low Risk  (07/11/2024)  Depression (PHQ2-9): Low Risk  (07/11/2024)  Financial Resource Strain: Low Risk  (07/11/2024)  Physical Activity: Insufficiently Active (07/11/2024)  Social Connections: Moderately Integrated (07/25/2024)  Stress: No Stress Concern Present (07/11/2024)  Tobacco Use: Medium Risk (07/24/2024)  Health Literacy: Adequate Health Literacy (07/11/2024)     Readmission Risk Interventions     No data to display

## 2024-07-29 NOTE — Progress Notes (Signed)
 Progress Note   Patient: Jacqueline Yoder FMW:[id number] DOB: 01-10-1946 DOA: 07/24/2024     4 DOS: the patient was seen and examined on 07/29/2024   Brief hospital course: 78 y.o. female with medical history significant for  HTN, HLD, and rectal cancer with lung and bony metastases admitted for SBO in July 2025 which resolved spontaneously being admitted with SBO with concern for esophageal perforation with pneumomediastinum on CT.  Transfer was attempted to [facility] however CT surgery recommended transfer to [facility], but [facility] said that surgery should be consulted to address the SBO.  (Please see ED provider's note ).  Surgeon, Dr. Jordis was ultimately consulted.  After discussion with family it was reported that patient was placed comfort care and hospitalist consulted for admission.  In speaking with patient as well as daughter and POA at bedside along with another daughter and son also at bedside, family stated that patient is DNR/DNI and does not want surgery but they are interested in conservative treatment and after explaining comfort care, they are not yet ready for comfort care. In the ED patient remained tachycardic and tachypneic but afebrile. WBC was normal with lactic acid 2.1, electrolyte abnormalities of hyponatremia and mild hyperkalemia and normal creatinine.  Urinalysis was consistent with UTI. CT abdomen and pelvis showing SBO with possible small esophageal perforation with pneumoperitoneum Patient was started on Zosyn, LR Patient being admitted to the hospitalist service Family is open to a palliative care consult   10/10.  General surgery Dr. Jordis spoke with family about not doing surgery.  Patient not in any pain or having any nausea.  Admitted with bowel obstruction and esophageal perforation.  Hospice consulted. 10/11.  Patient having some abdominal pain.  Still no gas passed. 10/12.  Patient had bowel movement through her ostomy.  Will start on liquid diet. 10/13.  Will  advance to full liquid diet  Assessment and Plan: * Esophageal perforation General Surgery spoke with family and decided not to do surgery on 10/10.  Try to advance to soft diet today.  Empiric antibiotic.  Pain Worsening pain in the abdomen and shoulders.  Can continue Roxanol and or oxycodone  and IV medications today.  Add back gabapentin  at night.  SBO (small bowel obstruction) (HCC) Patient had a bowel movement.  Try to advance to solid food today.  No nausea or vomiting.  Lactic acidosis Secondary to esophageal perforation and/or small bowel obstruction.  Colon cancer metastasized to lung [facility]) And bone.  Family signed up for hospice last week.  Patient is a DO NOT RESUSCITATE.    Hyponatremia Sodium 136  Essential hypertension Holding medication  Dyslipidemia Holding meds  Increased anion gap metabolic acidosis Continue IV fluid.  Resolved  Cystitis Urine culture growing multiple organisms.  The Zosyn that she is on would cover.  Right groin wound Appreciate wound care consultation.  Cleanse with saline, pat dry the peri-wound skin. Apply alginate #[id number] to the wound bed, cover with gauze or foam dressing. Change daily. Addendum: before remove the alginate, moisten with saline, the dressing will become a gelatinous and will come off easily.  Decubitus ulcer of coccyx, stage 2 (HCC) Wound 05/04/24 2100 Pressure Injury Coccyx Mid Stage 2 -  Partial thickness loss of dermis presenting as a shallow open injury with a red, pink wound bed without slough. (Active)     Bilateral shoulder pain Lidoderm  patch alternating shoulders.  Voltaren  gel alternating shoulders.  Hypokalemia Replace in IV fluid  Subjective: Patient had more pain today and shoulders a little bit in the abdomen.  Patient was also adjusted and moved around in the bed.  Will try to advance to soft diet and see how this goes.  Physical Exam: Vitals:   07/28/24 2153 07/28/24 2155 07/29/24  0447 07/29/24 0818  BP: (!) 127/49 (!) 122/56 (!) 127/59 134/63  Pulse: (!) 101 (!) 101 (!) 101 100  Resp: 16   18  Temp: 97.9 F (36.6 C)   99 F (37.2 C)  TempSrc: Oral   Oral  SpO2: 100% 100% 99% 100%  Weight:      Height:       Physical Exam HENT:     Head: Normocephalic.  Eyes:     General: Lids are normal.  Cardiovascular:     Rate and Rhythm: Normal rate and regular rhythm.     Heart sounds: Normal heart sounds, S1 normal and S2 normal.  Pulmonary:     Breath sounds: Examination of the right-lower field reveals decreased breath sounds. Examination of the left-lower field reveals decreased breath sounds. Decreased breath sounds present. No wheezing, rhonchi or rales.  Abdominal:     General: Bowel sounds are decreased.     Palpations: Abdomen is soft.     Tenderness: There is generalized abdominal tenderness.     Comments: Stool in ostomy bag  Musculoskeletal:     Right lower leg: No swelling.     Left lower leg: No swelling.  Skin:    General: Skin is warm.     Findings: No rash.  Neurological:     Mental Status: She is alert.     Data Reviewed: Sodium 136, potassium 3.5 creatinine 0.33 lactic acid 1 Family Communication: Spoke with family at bedside this morning and again early afternoon  Disposition: Status is: Inpatient Remains inpatient appropriate because: Patient more pain today.  Trying to adjust Roxanol dose.  Planned Discharge Destination: Plan will be to go home with hospice    Time spent: 28 minutes  Author: Charlie Patterson, MD 07/29/2024 1:11 PM  For on call review [email or web address].

## 2024-07-31 ENCOUNTER — Encounter: Payer: Self-pay | Admitting: Oncology

## 2024-07-31 NOTE — Telephone Encounter (Signed)
 Requested medications are due for refill today.  unsure  Requested medications are on the active medications list.  no  Last refill. 04/25/2024  Future visit scheduled.   yes  Notes to clinic.  Rx was d/c'd at discharge. Please review for refill.    Requested Prescriptions  Pending Prescriptions Disp Refills   triamterene -hydrochlorothiazide  (DYAZIDE ) 37.5-25 MG capsule [Pharmacy Med Name: Triamterene -HCTZ 37.5-25 MG Oral Capsule] 100 capsule 2    Sig: TAKE 1 CAPSULE BY MOUTH DAILY     Cardiovascular: Diuretic Combos Failed - 07/31/2024  3:11 PM      Failed - Cr in normal range and within 180 days    Creatinine  Date Value Ref Range Status  05/15/2024 0.80 0.44 - 1.00 mg/dL Final   Creat  Date Value Ref Range Status  06/24/2021 1.10 (H) 0.60 - 1.00 mg/dL Final   Creatinine, Ser  Date Value Ref Range Status  07/29/2024 0.33 (L) 0.44 - 1.00 mg/dL Final         Passed - K in normal range and within 180 days    Potassium  Date Value Ref Range Status  07/29/2024 3.5 3.5 - 5.1 mmol/L Final         Passed - Na in normal range and within 180 days    Sodium  Date Value Ref Range Status  07/29/2024 136 135 - 145 mmol/L Final         Passed - Last BP in normal range    BP Readings from Last 1 Encounters:  07/29/24 (!) 119/57         Passed - Valid encounter within last 6 months    Recent Outpatient Visits           2 weeks ago Annual physical exam   [place] [facility] [place], Marsa PARAS, DO   4 weeks ago Acute non-recurrent frontal sinusitis   [place] [facility] [place] Marsa PARAS, DO   2 months ago Metastatic adenocarcinoma [facility])   [place] [facility] [place] Marsa PARAS, DO   6 months ago Metastatic adenocarcinoma [facility])   [place] [facility] [place] Marsa PARAS, DO   7 months ago Chronic right shoulder pain   [place] [facility] [place], Angeline ORN, [place]

## 2024-08-04 ENCOUNTER — Telehealth: Admitting: Family Medicine

## 2024-08-04 ENCOUNTER — Encounter: Payer: Self-pay | Admitting: Family Medicine

## 2024-08-04 DIAGNOSIS — N182 Chronic kidney disease, stage 2 (mild): Secondary | ICD-10-CM

## 2024-08-04 DIAGNOSIS — R5381 Other malaise: Secondary | ICD-10-CM

## 2024-08-04 DIAGNOSIS — C799 Secondary malignant neoplasm of unspecified site: Secondary | ICD-10-CM

## 2024-08-04 DIAGNOSIS — C189 Malignant neoplasm of colon, unspecified: Secondary | ICD-10-CM | POA: Diagnosis not present

## 2024-08-04 DIAGNOSIS — C7800 Secondary malignant neoplasm of unspecified lung: Secondary | ICD-10-CM

## 2024-08-04 DIAGNOSIS — Z515 Encounter for palliative care: Secondary | ICD-10-CM | POA: Diagnosis not present

## 2024-08-04 DIAGNOSIS — Z933 Colostomy status: Secondary | ICD-10-CM | POA: Diagnosis not present

## 2024-08-04 DIAGNOSIS — I129 Hypertensive chronic kidney disease with stage 1 through stage 4 chronic kidney disease, or unspecified chronic kidney disease: Secondary | ICD-10-CM | POA: Diagnosis not present

## 2024-08-04 DIAGNOSIS — I890 Lymphedema, not elsewhere classified: Secondary | ICD-10-CM | POA: Diagnosis not present

## 2024-08-04 DIAGNOSIS — C7951 Secondary malignant neoplasm of bone: Secondary | ICD-10-CM

## 2024-08-04 NOTE — Progress Notes (Signed)
 Subjective:    Patient ID: Jacqueline Yoder, female    DOB: [birth year], 78 y.o.   MRN: [id number]  Jacqueline Yoder is a 78 y.o. female presenting on 08/04/2024 for Hospice Patient (Metastatic cancer colon to bone and lung)   Virtual / Telehealth Encounter - Video Visit via MyChart The purpose of this virtual visit is to provide medical care while limiting exposure to the novel coronavirus (COVID19) for both patient and office staff.  Consent was obtained for remote visit:  Yes.   Answered questions that patient had about telehealth interaction:  Yes.   I discussed the limitations, risks, security and privacy concerns of performing an evaluation and management service by video/telephone. I also discussed with the patient that there may be a patient responsible charge related to this service. The patient expressed understanding and agreed to proceed.  Patient Location: Home Provider Location: [facility] (Office)  Participants in virtual visit: - Patient: Jacqueline Yoder and daughter [name] - CMA: Alan Fontana CMA - Provider: Dr Edman   HPI  Discussed the use of AI scribe software for clinical note transcription with the patient, who gave verbal consent to proceed.  History of Present Illness   [name] is a 78 year old female with metastatic colon cancer who presents for paperwork and hospice care coordination.  Colon Adenocarcinoma metastatic to bone / lung Metastatic bone pain - Metastatic colon cancer with bone involvement, including the clavicle R with fracture - Improved pain after small bowel obstruction resolved in hospitalization recently  Followed by Home Hospice, [facility] - Pain management regimen includes lidocaine  patch applied to the shoulder for 12 hours on and 12 hours off, Voltaren  on the opposite shoulder, and liquid geriatric extra strength Tylenol  before morning and evening routines - No morphine  required since returning home  on hospice, orders available - Pain is well-managed with current regimen  Functional status and activities of daily living - Requires caregiver assistance with bathing, food preparation, and mobility ADLs - Unable to independently manage daily activities - No transportation needs as she is not attending appointments outside the home. But she requires assistance if needs apts  Medication management - Prednisone  started on Friday recently for bone pain - BP med discontinued in hospital but can resume now if need  Patient's daughter [name] is requesting FMLA for continuous leave for primary caregiver duties ADLs, food preparation, feeding, bathing, mobility, transportation to appointments      07/11/2024   11:02 AM 05/07/2024    8:51 AM 05/01/2024   10:01 AM  Depression screen PHQ 2/9  Decreased Interest 0 0 0  Down, Depressed, Hopeless 1 0 0  PHQ - 2 Score 1 0 0  Altered sleeping 0    Tired, decreased energy 1    Change in appetite 0    Feeling bad or failure about yourself  1    Trouble concentrating 0    Moving slowly or fidgety/restless 0    Suicidal thoughts 0    PHQ-9 Score 3    Difficult doing work/chores Not difficult at all         01/08/2024   10:49 AM 09/26/2023    1:59 PM 06/11/2023    2:48 PM 01/03/2023    9:25 AM  GAD 7 : Generalized Anxiety Score  Nervous, Anxious, on Edge 0 0 0 0  Control/stop worrying 0 0 0 0  Worry too much - different things 0 0 0 0  Trouble relaxing  0 0 0 0  Restless 0 0 0 0  Easily annoyed or irritable 0 0 0 0  Afraid - awful might happen 0 0 0 0  Total GAD 7 Score 0 0 0 0  Anxiety Difficulty Not difficult at all  Not difficult at all Not difficult at all    Social History   Tobacco Use   Smoking status: Former    Current packs/day: 0.00    Types: Cigarettes    Quit date: 12/02/1977    Years since quitting: 46.7   Smokeless tobacco: Former  Building services engineer status: Never Used  Substance Use Topics   Alcohol use: Never    Drug use: Never    Review of Systems Per HPI unless specifically indicated above     Objective:    There were no vitals taken for this visit.  Wt Readings from Last 3 Encounters:  07/24/24 161 lb (73 kg)  07/02/24 163 lb 2 oz (74 kg)  06/26/24 156 lb (70.8 kg)     Physical Exam  Note examination was completely remotely via video observation objective data only  Gen - chronically ill and frail-appearing, no acute distress or apparent pain, comfortable HEENT - eyes appear clear without discharge or redness, hearing loss, no hearing aid in, cannot hear well Heart/Lungs - cannot examine virtually - observed no evidence of coughing or labored breathing. Abd - cannot examine virtually  Skin - face visible today- no rash Neuro - awake, alert, oriented Psych - not anxious appearing   Results for orders placed or performed during the hospital encounter of 07/24/24  Blood Culture (routine x 2)   Collection Time: 07/24/24  1:16 PM   Specimen: BLOOD  Result Value Ref Range   Specimen Description BLOOD BLOOD RIGHT ARM    Special Requests      BOTTLES DRAWN AEROBIC AND ANAEROBIC Blood Culture adequate volume   Culture      NO GROWTH 5 DAYS Performed at [facility], [address] [place], [place] [address]    Report Status 07/29/2024 FINAL   Resp panel by RT-PCR (RSV, Flu A&B, Covid) Anterior Nasal Swab   Collection Time: 07/24/24  1:16 PM   Specimen: Anterior Nasal Swab  Result Value Ref Range   SARS Coronavirus 2 by RT PCR NEGATIVE NEGATIVE   Influenza A by PCR NEGATIVE NEGATIVE   Influenza B by PCR NEGATIVE NEGATIVE   Resp Syncytial Virus by PCR NEGATIVE NEGATIVE  Lactic acid, plasma   Collection Time: 07/24/24  1:16 PM  Result Value Ref Range   Lactic Acid, Venous 2.0 (HH) 0.5 - 1.9 mmol/L  Comprehensive metabolic panel   Collection Time: 07/24/24  1:16 PM  Result Value Ref Range   Sodium 125 (L) 135 - 145 mmol/L   Potassium 5.3 (H) 3.5 - 5.1 mmol/L    Chloride 87 (L) 98 - 111 mmol/L   CO2 25 22 - 32 mmol/L   Glucose, Bld 118 (H) 70 - 99 mg/dL   BUN 18 8 - 23 mg/dL   Creatinine, Ser 9.37 0.44 - 1.00 mg/dL   Calcium  9.3 8.9 - 10.3 mg/dL   Total Protein 6.8 6.5 - 8.1 g/dL   Albumin 2.7 (L) 3.5 - 5.0 g/dL   AST 99 (H) 15 - 41 U/L   ALT 42 0 - 44 U/L   Alkaline Phosphatase 107 38 - 126 U/L   Total Bilirubin 0.8 0.0 - 1.2 mg/dL   GFR, Estimated >39 >39  mL/min   Anion gap 13 5 - 15  CBC with Differential   Collection Time: 07/24/24  1:16 PM  Result Value Ref Range   WBC 6.1 4.0 - 10.5 K/uL   RBC 4.01 3.87 - 5.11 MIL/uL   Hemoglobin 12.1 12.0 - 15.0 g/dL   HCT 64.0 (L) 63.9 - 53.9 %   MCV 89.5 80.0 - 100.0 fL   MCH 30.2 26.0 - 34.0 pg   MCHC 33.7 30.0 - 36.0 g/dL   RDW 86.2 88.4 - 84.4 %   Platelets 244 150 - 400 K/uL   nRBC 0.0 0.0 - 0.2 %   Neutrophils Relative % 80 %   Neutro Abs 4.9 1.7 - 7.7 K/uL   Lymphocytes Relative 10 %   Lymphs Abs 0.6 (L) 0.7 - 4.0 K/uL   Monocytes Relative 9 %   Monocytes Absolute 0.5 0.1 - 1.0 K/uL   Eosinophils Relative 0 %   Eosinophils Absolute 0.0 0.0 - 0.5 K/uL   Basophils Relative 0 %   Basophils Absolute 0.0 0.0 - 0.1 K/uL   Immature Granulocytes 1 %   Abs Immature Granulocytes 0.03 0.00 - 0.07 K/uL  Blood Culture (routine x 2)   Collection Time: 07/24/24  1:21 PM   Specimen: BLOOD  Result Value Ref Range   Specimen Description BLOOD BLOOD LEFT ARM    Special Requests      BOTTLES DRAWN AEROBIC AND ANAEROBIC Blood Culture adequate volume   Culture      NO GROWTH 5 DAYS Performed at [facility], [address] [place], [place] [address]    Report Status 07/29/2024 FINAL   Urine Culture   Collection Time: 07/24/24  1:50 PM   Specimen: Urine, Random  Result Value Ref Range   Specimen Description      URINE, RANDOM Performed at [facility], [address] [place], [place] [address]    Special Requests      NONE Reflexed from [id number] Performed at [facility], [address] [place], [place] [address]    Culture (A)     >=100,000 COLONIES/mL MULTIPLE SPECIES PRESENT, SUGGEST RECOLLECTION   Report Status 07/25/2024 FINAL   Urinalysis, w/ Reflex to Culture (Infection Suspected) -Urine, Clean Catch   Collection Time: 07/24/24  1:50 PM  Result Value Ref Range   Specimen Source URINE, CLEAN CATCH    Color, Urine YELLOW (A) YELLOW   APPearance CLOUDY (A) CLEAR   Specific Gravity, Urine 1.025 1.005 - 1.030   pH 5.0 5.0 - 8.0   Glucose, UA NEGATIVE NEGATIVE mg/dL   Hgb urine dipstick LARGE (A) NEGATIVE   Bilirubin Urine NEGATIVE NEGATIVE   Ketones, ur NEGATIVE NEGATIVE mg/dL   Protein, ur 899 (A) NEGATIVE mg/dL   Nitrite NEGATIVE NEGATIVE   Leukocytes,Ua MODERATE (A) NEGATIVE   RBC / HPF >50 0 - 5 RBC/hpf   WBC, UA 21-50 0 - 5 WBC/hpf   Bacteria, UA RARE (A) NONE SEEN   Squamous Epithelial / HPF 0-5 0 - 5 /HPF  Protime-INR   Collection Time: 07/24/24  1:51 PM  Result Value Ref Range   Prothrombin Time 16.8 (H) 11.4 - 15.2 seconds   INR 1.3 (H) 0.8 - 1.2  Lactic acid, plasma   Collection Time: 07/24/24  3:59 PM  Result Value Ref Range   Lactic Acid, Venous 2.1 (HH) 0.5 - 1.9 mmol/L  Comprehensive metabolic panel   Collection Time: 07/25/24  4:29 AM  Result Value Ref Range  Sodium 127 (L) 135 - 145 mmol/L   Potassium 4.5 3.5 - 5.1 mmol/L   Chloride 88 (L) 98 - 111 mmol/L   CO2 25 22 - 32 mmol/L   Glucose, Bld 149 (H) 70 - 99 mg/dL   BUN 18 8 - 23 mg/dL   Creatinine, Ser 9.32 0.44 - 1.00 mg/dL   Calcium  8.9 8.9 - 10.3 mg/dL   Total Protein 6.0 (L) 6.5 - 8.1 g/dL   Albumin 2.3 (L) 3.5 - 5.0 g/dL   AST 64 (H) 15 - 41 U/L   ALT 37 0 - 44 U/L   Alkaline Phosphatase 96 38 - 126 U/L   Total Bilirubin 0.5 0.0 - 1.2 mg/dL   GFR, Estimated >39 >39 mL/min   Anion gap 14 5 - 15  Lactic acid, plasma   Collection Time: 07/25/24  4:29 AM  Result Value Ref Range   Lactic Acid, Venous 1.3 0.5 - 1.9 mmol/L  Basic metabolic  panel with GFR   Collection Time: 07/26/24  5:10 AM  Result Value Ref Range   Sodium 129 (L) 135 - 145 mmol/L   Potassium 3.8 3.5 - 5.1 mmol/L   Chloride 92 (L) 98 - 111 mmol/L   CO2 27 22 - 32 mmol/L   Glucose, Bld 79 70 - 99 mg/dL   BUN 17 8 - 23 mg/dL   Creatinine, Ser 9.27 0.44 - 1.00 mg/dL   Calcium  8.5 (L) 8.9 - 10.3 mg/dL   GFR, Estimated >39 >39 mL/min   Anion gap 10 5 - 15  Basic metabolic panel with GFR   Collection Time: 07/27/24  7:08 AM  Result Value Ref Range   Sodium 134 (L) 135 - 145 mmol/L   Potassium 3.4 (L) 3.5 - 5.1 mmol/L   Chloride 98 98 - 111 mmol/L   CO2 23 22 - 32 mmol/L   Glucose, Bld 54 (L) 70 - 99 mg/dL   BUN 14 8 - 23 mg/dL   Creatinine, Ser 9.36 0.44 - 1.00 mg/dL   Calcium  8.2 (L) 8.9 - 10.3 mg/dL   GFR, Estimated >39 >39 mL/min   Anion gap 13 5 - 15  Magnesium    Collection Time: 07/27/24  7:08 AM  Result Value Ref Range   Magnesium  1.8 1.7 - 2.4 mg/dL  Phosphorus   Collection Time: 07/27/24  7:08 AM  Result Value Ref Range   Phosphorus 3.1 2.5 - 4.6 mg/dL  Renal function panel   Collection Time: 07/28/24  8:03 AM  Result Value Ref Range   Sodium 138 135 - 145 mmol/L   Potassium 3.5 3.5 - 5.1 mmol/L   Chloride 99 98 - 111 mmol/L   CO2 21 (L) 22 - 32 mmol/L   Glucose, Bld 56 (L) 70 - 99 mg/dL   BUN 14 8 - 23 mg/dL   Creatinine, Ser 9.43 0.44 - 1.00 mg/dL   Calcium  8.6 (L) 8.9 - 10.3 mg/dL   Phosphorus 2.7 2.5 - 4.6 mg/dL   Albumin 2.0 (L) 3.5 - 5.0 g/dL   GFR, Estimated >39 >39 mL/min   Anion gap 18 (H) 5 - 15  Magnesium    Collection Time: 07/28/24  8:03 AM  Result Value Ref Range   Magnesium  2.0 1.7 - 2.4 mg/dL  Renal function panel   Collection Time: 07/29/24  3:43 AM  Result Value Ref Range   Sodium 136 135 - 145 mmol/L   Potassium 3.5 3.5 - 5.1 mmol/L   Chloride 103 98 - 111 mmol/L  CO2 25 22 - 32 mmol/L   Glucose, Bld 100 (H) 70 - 99 mg/dL   BUN 8 8 - 23 mg/dL   Creatinine, Ser 9.66 (L) 0.44 - 1.00 mg/dL   Calcium  8.4  (L) 8.9 - 10.3 mg/dL   Phosphorus 2.6 2.5 - 4.6 mg/dL   Albumin 2.1 (L) 3.5 - 5.0 g/dL   GFR, Estimated >39 >39 mL/min   Anion gap 8 5 - 15  Magnesium    Collection Time: 07/29/24  3:43 AM  Result Value Ref Range   Magnesium  1.8 1.7 - 2.4 mg/dL  Lactic acid, plasma   Collection Time: 07/29/24  3:45 AM  Result Value Ref Range   Lactic Acid, Venous 1.0 0.5 - 1.9 mmol/L   *Note: Due to a large number of results and/or encounters for the requested time period, some results have not been displayed. A complete set of results can be found in Results Review.      Assessment & Plan:   Problem List Items Addressed This Visit     Benign hypertension with CKD (chronic kidney disease), stage II   Colon cancer metastasized to lung [facility]) - Primary   Colostomy in place [facility])   Hospice care patient   Metastatic adenocarcinoma [facility])   Other Visit Diagnoses       Metastatic cancer to bone [facility])         Physical deconditioning         Lymphedema of both lower extremities           Metastatic colon cancer with lung and bone involvement Managed under home hospice care. Pain controlled with non-opioid medications right now with success on Tylenol  Extra Strength and Lidocaine  patches. Hospice has orders for Morphine  AS NEEDED if need but has not required it - Continue home hospice care. [facility] - Continue current pain management regimen with lidocaine  patch, Voltaren , and liquid geriatric Tylenol . - Keep morphine  available for future use if pain becomes unmanageable.  Recent hospitalization for SBO small bowel obstruction 10/10 to 10/16 - resolved, seems this was significant source of pain  Daughter [name] is primary caregiver. Patient has moved in with her and they have hospital bed now. Care includes assistance with daily activities of living ADLs, meal prep, feeding, hygiene, bathing, dressing, mobility, medication administration, and pain management. - Provide continuous caregiver support  for up to three to six months, with potential for extension if need - Also assist with transportation to appointments as needed  Hypertension with chronic kidney disease (CKD) II Discussion on her BP medication, it was requested to be refilled by pharmacy but DC'd by hospital recently. Patient's BP remains stable, I advised we can re order it if it is helpful, if she has edema or symptoms for high blood pressure, better to take medication but if no longer required, she can pause it.  Blood pressure medication available but not necessary per hospice care. Decision based on symptom management and comfort. - Keep blood pressure medication available. - Monitor blood pressure and symptoms. - Administer medication if blood pressure is high or if symptoms warrant.      Complete FMLA paperwork through [employer] for patient's daughter, Sinclair Arrazola to remain out of work on continuous leave for up to 6 months for FMLA leave, may return sooner if able.     No orders of the defined types were placed in this encounter.   No orders of the defined types were placed in this encounter.   Follow up  plan: Return if symptoms worsen or fail to improve.   Patient verbalizes understanding with the above medical recommendations including the limitation of remote medical advice.  Specific follow-up and call-back criteria were given for patient to follow-up or seek medical care more urgently if needed.  Total duration of direct patient care provided via video conference: 15 minutes   [name], DO [facility] 08/04/2024, 4:00 PM

## 2024-08-04 NOTE — Patient Instructions (Addendum)
   Please schedule a Follow-up Appointment to: Return if symptoms worsen or fail to improve.  If you have any other questions or concerns, please feel free to call the office or send a message through MyChart. You may also schedule an earlier appointment if necessary.  Additionally, you may be receiving a survey about your experience at our office within a few days to 1 week by e-mail or mail. We value your feedback.  Saralyn Pilar, DO [facility], [place]

## 2024-08-08 ENCOUNTER — Ambulatory Visit: Admitting: Podiatry

## 2024-08-08 ENCOUNTER — Encounter: Payer: Self-pay | Admitting: Family Medicine

## 2024-08-15 ENCOUNTER — Encounter: Payer: Self-pay | Admitting: Oncology

## 2024-08-15 ENCOUNTER — Encounter: Payer: Self-pay | Admitting: Family Medicine

## 2024-08-16 ENCOUNTER — Other Ambulatory Visit: Payer: Self-pay | Admitting: Family Medicine

## 2024-08-16 DIAGNOSIS — E782 Mixed hyperlipidemia: Secondary | ICD-10-CM

## 2024-08-19 NOTE — Telephone Encounter (Signed)
 Requested medications are due for refill today.  unsure  Requested medications are on the active medications list.  no  Last refill. 07/11/2023 #100 3 rf  Future visit scheduled.   yes  Notes to clinic.  Med was discontinued at discharge by Dr. Josette.    Requested Prescriptions  Pending Prescriptions Disp Refills   simvastatin  (ZOCOR ) 40 MG tablet [Pharmacy Med Name: Simvastatin  40 MG Oral Tablet] 100 tablet 2    Sig: TAKE 1 TABLET BY MOUTH AT  BEDTIME     Cardiovascular:  Antilipid - Statins Failed - 08/19/2024 10:56 AM      Failed - Lipid Panel in normal range within the last 12 months    Cholesterol  Date Value Ref Range Status  06/26/2024 192 <200 mg/dL Final   LDL Cholesterol (Calc)  Date Value Ref Range Status  06/26/2024 103 (H) mg/dL (calc) Final    Comment:    Reference range: <100 . Desirable range <100 mg/dL for primary prevention;   <70 mg/dL for patients with CHD or diabetic patients  with > or = 2 CHD risk factors. SABRA LDL-C is now calculated using the Martin-Hopkins  calculation, which is a validated novel method providing  better accuracy than the Friedewald equation in the  estimation of LDL-C.  Gladis APPLETHWAITE et al. SANDREA. 7986;689(80): 2061-2068  (http://education.[employer].com/faq/FAQ164)    HDL  Date Value Ref Range Status  06/26/2024 74 > OR = 50 mg/dL Final   Triglycerides  Date Value Ref Range Status  06/26/2024 61 <150 mg/dL Final         Passed - Patient is not pregnant      Passed - Valid encounter within last 12 months    Recent Outpatient Visits           2 weeks ago Colon cancer metastasized to lung [facility])   [place] [facility] Edman, Marsa PARAS, DO   1 month ago Annual physical exam   [facility] Edman Marsa PARAS, DO   1 month ago Acute non-recurrent frontal sinusitis   [facility] Ed[facility] Marsa PARAS, DO   3 months ago Metastatic  [facility])   [facility] Edman Marsa PARAS, DO   7 months ago Metastatic adenocarcinoma [facility])    [facility] [place], Marsa PARAS, [place]

## 2024-08-20 NOTE — Telephone Encounter (Signed)
 Simvastatin  has been discontinued for this patient. She is on hospice. I confirmed with her family about not taking the medication  Cancelled the re order  [name], DO [facility] 08/20/2024, 12:54 PM

## 2024-08-30 ENCOUNTER — Encounter: Payer: Self-pay | Admitting: Oncology

## 2024-09-01 ENCOUNTER — Other Ambulatory Visit: Payer: Self-pay

## 2024-09-01 ENCOUNTER — Encounter: Payer: Self-pay | Admitting: Family Medicine

## 2024-09-01 ENCOUNTER — Encounter: Payer: Self-pay | Admitting: Oncology

## 2024-09-01 ENCOUNTER — Telehealth (INDEPENDENT_AMBULATORY_CARE_PROVIDER_SITE_OTHER): Admitting: Family Medicine

## 2024-09-01 DIAGNOSIS — C7951 Secondary malignant neoplasm of bone: Secondary | ICD-10-CM

## 2024-09-01 DIAGNOSIS — I890 Lymphedema, not elsewhere classified: Secondary | ICD-10-CM

## 2024-09-01 DIAGNOSIS — C189 Malignant neoplasm of colon, unspecified: Secondary | ICD-10-CM | POA: Diagnosis not present

## 2024-09-01 DIAGNOSIS — Z515 Encounter for palliative care: Secondary | ICD-10-CM

## 2024-09-01 DIAGNOSIS — L739 Follicular disorder, unspecified: Secondary | ICD-10-CM

## 2024-09-01 DIAGNOSIS — M898X1 Other specified disorders of bone, shoulder: Secondary | ICD-10-CM

## 2024-09-01 DIAGNOSIS — M25511 Pain in right shoulder: Secondary | ICD-10-CM

## 2024-09-01 DIAGNOSIS — C7800 Secondary malignant neoplasm of unspecified lung: Secondary | ICD-10-CM | POA: Diagnosis not present

## 2024-09-01 DIAGNOSIS — M25512 Pain in left shoulder: Secondary | ICD-10-CM

## 2024-09-01 DIAGNOSIS — G8929 Other chronic pain: Secondary | ICD-10-CM

## 2024-09-01 DIAGNOSIS — C799 Secondary malignant neoplasm of unspecified site: Secondary | ICD-10-CM

## 2024-09-01 MED ORDER — AMOXICILLIN 400 MG/5ML PO SUSR
500.0000 mg | Freq: Two times a day (BID) | ORAL | 0 refills | Status: AC
  Filled 2024-09-01: qty 200, 16d supply, fill #0

## 2024-09-01 MED ORDER — LIDOCAINE 5 % EX PTCH
MEDICATED_PATCH | CUTANEOUS | 5 refills | Status: DC
  Filled 2024-09-01: qty 30, 30d supply, fill #0
  Filled 2024-09-02: qty 30, 15d supply, fill #0
  Filled 2024-09-05: qty 30, 30d supply, fill #0

## 2024-09-01 MED ORDER — MUPIROCIN 2 % EX OINT
1.0000 | TOPICAL_OINTMENT | Freq: Two times a day (BID) | CUTANEOUS | 0 refills | Status: DC
  Filled 2024-09-01: qty 22, 11d supply, fill #0

## 2024-09-01 NOTE — Patient Instructions (Addendum)
 Thank you for coming to the office today.  Start Lidoderm  5% new rx instead of 4%  Trial on Mupirocin ointment antibiotic  Please schedule a Follow-up Appointment to: Return in about 4 weeks (around 09/29/2024) for 4 week virtual mychart follow-up hospice updates / pain.  If you have any other questions or concerns, please feel free to call the office or send a message through MyChart. You may also schedule an earlier appointment if necessary.  Additionally, you may be receiving a survey about your experience at our office within a few days to 1 week by e-mail or mail. We value your feedback.  [name], DO [facility], [place]

## 2024-09-01 NOTE — Progress Notes (Signed)
 Subjective:    Patient ID: Jacqueline Yoder, female    DOB: November 26, 1945, 78 y.o.   MRN: [id number]  Jacqueline Yoder is a 78 y.o. female presenting on 09/01/2024 for Shoulder Pain and Hospice, Metastatic Cancer   Virtual / Telehealth Encounter - Video Visit via MyChart The purpose of this virtual visit is to provide medical care while limiting exposure to the novel coronavirus (COVID19) for both patient and office staff.  Consent was obtained for remote visit:  Yes.   Answered questions that patient had about telehealth interaction:  Yes.   I discussed the limitations, risks, security and privacy concerns of performing an evaluation and management service by video/telephone. I also discussed with the patient that there may be a patient responsible charge related to this service. The patient expressed understanding and agreed to proceed.  Patient Location: Home Provider Location: [facility] (Office)  Participants in virtual visit: - Patient: Jacqueline Yoder and daughter Jacqueline Yoder (daughter) - CMA: Alan Fontana CMA - Provider: Dr Edman   HPI  Discussed the use of AI scribe software for clinical note transcription with the patient, who gave verbal consent to proceed.  History of Present Illness   Jacqueline Yoder is a 78 year old female with metastatic colon cancer who presents for a virtual follow-up regarding wound care and pain management.  Cutaneous wound and local symptoms R anterior abdomen / hip region - Skin wound located in the groin toward the hip area - Associated with drainage and development of small bumps - Wound care includes cleaning with distilled water, use of moisture-wicking pads, and application of ice packs - Lidocaine  ointment discontinued due to stinging sensation - No current use of lidocaine  ointment  Bed to lift Chair, not every day  Metastatic Colon Cancer to Bone and Lung Musculoskeletal pain secondary to bone mets and fracture R  shoulder / clavicle Follow[facility] by [facility] - Shoulder pain, particularly near the neck - Pain management includes Tylenol , lidocaine  patches (alternated on shoulders, applied in the morning and removed at night), and ice packs - Voltaren  applied to knees and elbows - No use of stronger pain medications available such as Morphine   Nutritional status and weight loss - Significant weight loss. No recent weight measured however - On a liquid diet, with occasional attempts at soft and solid foods - Difficulty maintaining adequate nutrition, which is important for wound healing  Sinusitis / Headache and upper respiratory symptoms - Headaches located over the eyes, sometimes radiating to the ears - Uses Allegra and Flonase  for congestion - Cough productive of phlegm, onset after previous hospitalization for bowel obstruction  Anticoagulation therapy Chronic DVT prophylaxis - Continues Eliquis  2.5 mg twice daily for prophylaxis - No issues with bleeding or bruising  Discontinued medications - Blood pressure medication and simvastatin  discontinued due to stable blood pressure and cholesterol levels     Functional status and activities of daily living - Requires caregiver assistance with bathing, food preparation, and mobility ADLs - Unable to independently manage daily activities - No transportation needs as she is not attending appointments outside the home. But she requires assistance if needs apts   Patient's daughter Jacqueline Yoder is on FMLA for continuous leave for primary caregiver duties ADLs, food preparation, feeding, bathing, mobility, transportation to appointments  Nutrition reduced, she is using liquid diet mostly soft diet some,  Feel like some weight loss overall       09/01/2024    7:02 PM 07/11/2024  11:02 AM 05/07/2024    8:51 AM  Depression screen PHQ 2/9  Decreased Interest 0 0 0  Down, Depressed, Hopeless 0 1 0  PHQ - 2 Score 0 1 0  Altered sleeping  0    Tired, decreased energy  1   Change in appetite  0   Feeling bad or failure about yourself   1   Trouble concentrating  0   Moving slowly or fidgety/restless  0   Suicidal thoughts  0   PHQ-9 Score  3    Difficult doing work/chores  Not difficult at all      Data saved with a previous flowsheet row definition       01/08/2024   10:49 AM 09/26/2023    1:59 PM 06/11/2023    2:48 PM 01/03/2023    9:25 AM  GAD 7 : Generalized Anxiety Score  Nervous, Anxious, on Edge 0 0 0 0  Control/stop worrying 0 0 0 0  Worry too much - different things 0 0 0 0  Trouble relaxing 0 0 0 0  Restless 0 0 0 0  Easily annoyed or irritable 0 0 0 0  Afraid - awful might happen 0 0 0 0  Total GAD 7 Score 0 0 0 0  Anxiety Difficulty Not difficult at all  Not difficult at all Not difficult at all    Social History   Tobacco Use   Smoking status: Former    Current packs/day: 0.00    Types: Cigarettes    Quit date: 12/02/1977    Years since quitting: 46.7   Smokeless tobacco: Former  Building Services Engineer status: Never Used  Substance Use Topics   Alcohol use: Never   Drug use: Never    Review of Systems Per HPI unless specifically indicated above     Objective:    There were no vitals taken for this visit.  Wt Readings from Last 3 Encounters:  07/24/24 161 lb (73 kg)  07/02/24 163 lb 2 oz (74 kg)  06/26/24 156 lb (70.8 kg)     Physical Exam  Note examination was completely remotely via video observation objective data only  Gen - chronically ill and frail-appearing, no acute distress or apparent pain but some discomfort provoked with movement, comfortable HEENT - eyes appear clear without discharge or redness, hearing loss, no hearing aid in, cannot hear well Heart/Lungs - cannot examine virtually - observed no evidence of coughing or labored breathing. Abd - cannot examine virtually  Skin - photos viewed from mychart. Neuro - awake, alert, oriented Psych - not anxious  appearing  R anterior abdominal skin fold / inguinal region      Results for orders placed or performed during the hospital encounter of 07/24/24  Blood Culture (routine x 2)   Collection Time: 07/24/24  1:16 PM   Specimen: BLOOD  Result Value Ref Range   Specimen Description BLOOD BLOOD RIGHT ARM    Special Requests      BOTTLES DRAWN AEROBIC AND ANAEROBIC Blood Culture adequate volume   Culture      NO GROWTH 5 DAYS Performed at [facility], [address] [place], [place] [address]    Report Status 07/29/2024 FINAL   Resp panel by RT-PCR (RSV, Flu A&B, Covid) Anterior Nasal Swab   Collection Time: 07/24/24  1:16 PM   Specimen: Anterior Nasal Swab  Result Value Ref Range   SARS Coronavirus 2 by RT PCR NEGATIVE NEGATIVE  Influenza A by PCR NEGATIVE NEGATIVE   Influenza B by PCR NEGATIVE NEGATIVE   Resp Syncytial Virus by PCR NEGATIVE NEGATIVE  Lactic acid, plasma   Collection Time: 07/24/24  1:16 PM  Result Value Ref Range   Lactic Acid, Venous 2.0 (HH) 0.5 - 1.9 mmol/L  Comprehensive metabolic panel   Collection Time: 07/24/24  1:16 PM  Result Value Ref Range   Sodium 125 (L) 135 - 145 mmol/L   Potassium 5.3 (H) 3.5 - 5.1 mmol/L   Chloride 87 (L) 98 - 111 mmol/L   CO2 25 22 - 32 mmol/L   Glucose, Bld 118 (H) 70 - 99 mg/dL   BUN 18 8 - 23 mg/dL   Creatinine, Ser 9.37 0.44 - 1.00 mg/dL   Calcium  9.3 8.9 - 10.3 mg/dL   Total Protein 6.8 6.5 - 8.1 g/dL   Albumin 2.7 (L) 3.5 - 5.0 g/dL   AST 99 (H) 15 - 41 U/L   ALT 42 0 - 44 U/L   Alkaline Phosphatase 107 38 - 126 U/L   Total Bilirubin 0.8 0.0 - 1.2 mg/dL   GFR, Estimated >39 >39 mL/min   Anion gap 13 5 - 15  CBC with Differential   Collection Time: 07/24/24  1:16 PM  Result Value Ref Range   WBC 6.1 4.0 - 10.5 K/uL   RBC 4.01 3.87 - 5.11 MIL/uL   Hemoglobin 12.1 12.0 - 15.0 g/dL   HCT 64.0 (L) 63.9 - 53.9 %   MCV 89.5 80.0 - 100.0 fL   MCH 30.2 26.0 - 34.0 pg   MCHC 33.7 30.0 - 36.0 g/dL   RDW  86.2 88.4 - 84.4 %   Platelets 244 150 - 400 K/uL   nRBC 0.0 0.0 - 0.2 %   Neutrophils Relative % 80 %   Neutro Abs 4.9 1.7 - 7.7 K/uL   Lymphocytes Relative 10 %   Lymphs Abs 0.6 (L) 0.7 - 4.0 K/uL   Monocytes Relative 9 %   Monocytes Absolute 0.5 0.1 - 1.0 K/uL   Eosinophils Relative 0 %   Eosinophils Absolute 0.0 0.0 - 0.5 K/uL   Basophils Relative 0 %   Basophils Absolute 0.0 0.0 - 0.1 K/uL   Immature Granulocytes 1 %   Abs Immature Granulocytes 0.03 0.00 - 0.07 K/uL  Blood Culture (routine x 2)   Collection Time: 07/24/24  1:21 PM   Specimen: BLOOD  Result Value Ref Range   Specimen Description BLOOD BLOOD LEFT ARM    Special Requests      BOTTLES DRAWN AEROBIC AND ANAEROBIC Blood Culture adequate volume   Culture      NO GROWTH 5 DAYS Performed at [facility], [address] [place], [place] [address]    Report Status 07/29/2024 FINAL   Urine Culture   Collection Time: 07/24/24  1:50 PM   Specimen: Urine, Random  Result Value Ref Range   Specimen Description      URINE, RANDOM Performed at [facility], [address] [place], [place] [address]    Special Requests      NONE Reflexed from [id number] Performed at [facility], [address] [place], [place] [address]    Culture (A)     >=100,000 COLONIES/mL MULTIPLE SPECIES PRESENT, SUGGEST RECOLLECTION   Report Status 07/25/2024 FINAL   Urinalysis, w/ Reflex to Culture (Infection Suspected) -Urine, Clean Catch   Collection Time: 07/24/24  1:50 PM  Result Value Ref Range   Specimen  Source URINE, CLEAN CATCH    Color, Urine YELLOW (A) YELLOW   APPearance CLOUDY (A) CLEAR   Specific Gravity, Urine 1.025 1.005 - 1.030   pH 5.0 5.0 - 8.0   Glucose, UA NEGATIVE NEGATIVE mg/dL   Hgb urine dipstick LARGE (A) NEGATIVE   Bilirubin Urine NEGATIVE NEGATIVE   Ketones, ur NEGATIVE NEGATIVE mg/dL   Protein, ur 899 (A) NEGATIVE mg/dL   Nitrite NEGATIVE NEGATIVE   Leukocytes,Ua MODERATE (A)  NEGATIVE   RBC / HPF >50 0 - 5 RBC/hpf   WBC, UA 21-50 0 - 5 WBC/hpf   Bacteria, UA RARE (A) NONE SEEN   Squamous Epithelial / HPF 0-5 0 - 5 /HPF  Protime-INR   Collection Time: 07/24/24  1:51 PM  Result Value Ref Range   Prothrombin Time 16.8 (H) 11.4 - 15.2 seconds   INR 1.3 (H) 0.8 - 1.2  Lactic acid, plasma   Collection Time: 07/24/24  3:59 PM  Result Value Ref Range   Lactic Acid, Venous 2.1 (HH) 0.5 - 1.9 mmol/L  Comprehensive metabolic panel   Collection Time: 07/25/24  4:29 AM  Result Value Ref Range   Sodium 127 (L) 135 - 145 mmol/L   Potassium 4.5 3.5 - 5.1 mmol/L   Chloride 88 (L) 98 - 111 mmol/L   CO2 25 22 - 32 mmol/L   Glucose, Bld 149 (H) 70 - 99 mg/dL   BUN 18 8 - 23 mg/dL   Creatinine, Ser 9.32 0.44 - 1.00 mg/dL   Calcium  8.9 8.9 - 10.3 mg/dL   Total Protein 6.0 (L) 6.5 - 8.1 g/dL   Albumin 2.3 (L) 3.5 - 5.0 g/dL   AST 64 (H) 15 - 41 U/L   ALT 37 0 - 44 U/L   Alkaline Phosphatase 96 38 - 126 U/L   Total Bilirubin 0.5 0.0 - 1.2 mg/dL   GFR, Estimated >39 >39 mL/min   Anion gap 14 5 - 15  Lactic acid, plasma   Collection Time: 07/25/24  4:29 AM  Result Value Ref Range   Lactic Acid, Venous 1.3 0.5 - 1.9 mmol/L  Basic metabolic panel with GFR   Collection Time: 07/26/24  5:10 AM  Result Value Ref Range   Sodium 129 (L) 135 - 145 mmol/L   Potassium 3.8 3.5 - 5.1 mmol/L   Chloride 92 (L) 98 - 111 mmol/L   CO2 27 22 - 32 mmol/L   Glucose, Bld 79 70 - 99 mg/dL   BUN 17 8 - 23 mg/dL   Creatinine, Ser 9.27 0.44 - 1.00 mg/dL   Calcium  8.5 (L) 8.9 - 10.3 mg/dL   GFR, Estimated >39 >39 mL/min   Anion gap 10 5 - 15  Basic metabolic panel with GFR   Collection Time: 07/27/24  7:08 AM  Result Value Ref Range   Sodium 134 (L) 135 - 145 mmol/L   Potassium 3.4 (L) 3.5 - 5.1 mmol/L   Chloride 98 98 - 111 mmol/L   CO2 23 22 - 32 mmol/L   Glucose, Bld 54 (L) 70 - 99 mg/dL   BUN 14 8 - 23 mg/dL   Creatinine, Ser 9.36 0.44 - 1.00 mg/dL   Calcium  8.2 (L) 8.9 -  10.3 mg/dL   GFR, Estimated >39 >39 mL/min   Anion gap 13 5 - 15  Magnesium    Collection Time: 07/27/24  7:08 AM  Result Value Ref Range   Magnesium  1.8 1.7 - 2.4 mg/dL  Phosphorus   Collection Time:  07/27/24  7:08 AM  Result Value Ref Range   Phosphorus 3.1 2.5 - 4.6 mg/dL  Renal function panel   Collection Time: 07/28/24  8:03 AM  Result Value Ref Range   Sodium 138 135 - 145 mmol/L   Potassium 3.5 3.5 - 5.1 mmol/L   Chloride 99 98 - 111 mmol/L   CO2 21 (L) 22 - 32 mmol/L   Glucose, Bld 56 (L) 70 - 99 mg/dL   BUN 14 8 - 23 mg/dL   Creatinine, Ser 9.43 0.44 - 1.00 mg/dL   Calcium  8.6 (L) 8.9 - 10.3 mg/dL   Phosphorus 2.7 2.5 - 4.6 mg/dL   Albumin 2.0 (L) 3.5 - 5.0 g/dL   GFR, Estimated >39 >39 mL/min   Anion gap 18 (H) 5 - 15  Magnesium    Collection Time: 07/28/24  8:03 AM  Result Value Ref Range   Magnesium  2.0 1.7 - 2.4 mg/dL  Renal function panel   Collection Time: 07/29/24  3:43 AM  Result Value Ref Range   Sodium 136 135 - 145 mmol/L   Potassium 3.5 3.5 - 5.1 mmol/L   Chloride 103 98 - 111 mmol/L   CO2 25 22 - 32 mmol/L   Glucose, Bld 100 (H) 70 - 99 mg/dL   BUN 8 8 - 23 mg/dL   Creatinine, Ser 9.66 (L) 0.44 - 1.00 mg/dL   Calcium  8.4 (L) 8.9 - 10.3 mg/dL   Phosphorus 2.6 2.5 - 4.6 mg/dL   Albumin 2.1 (L) 3.5 - 5.0 g/dL   GFR, Estimated >39 >39 mL/min   Anion gap 8 5 - 15  Magnesium    Collection Time: 07/29/24  3:43 AM  Result Value Ref Range   Magnesium  1.8 1.7 - 2.4 mg/dL  Lactic acid, plasma   Collection Time: 07/29/24  3:45 AM  Result Value Ref Range   Lactic Acid, Venous 1.0 0.5 - 1.9 mmol/L   *Note: Due to a large number of results and/or encounters for the requested time period, some results have not been displayed. A complete set of results can be found in Results Review.      Assessment & Plan:   Problem List Items Addressed This Visit     Colon cancer metastasized to lung [facility])   Relevant Medications   amoxicillin  (AMOXIL ) 400 MG/5ML  suspension   Hospice care patient   Metastatic adenocarcinoma (HCC)   Relevant Medications   amoxicillin  (AMOXIL ) 400 MG/5ML suspension   Other Visit Diagnoses       Colon cancer metastasized to bone (HCC)    -  Primary   Relevant Medications   amoxicillin  (AMOXIL ) 400 MG/5ML suspension     Chronic right shoulder pain       Relevant Medications   lidocaine  (LIDODERM ) 5 %     Pain of right clavicle         Lymphedema of both lower extremities         Chronic pain in left shoulder       Relevant Medications   lidocaine  (LIDODERM ) 5 %     Folliculitis       Relevant Medications   mupirocin ointment (BACTROBAN) 2 %   amoxicillin  (AMOXIL ) 400 MG/5ML suspension        Metastatic colon cancer to lung and bone with palliative care management Metastatic colon cancer managed with palliative care. Current pain regimen effective without stronger medications. Eliquis  continued for prophylaxis. - Continue current pain management regimen with Tylenol , lidocaine , ice packs, and Voltaren . -  Continue Eliquis  2.5 mg twice daily for prophylaxis with long history of chronic DVT unless adverse effects occur. - Coordinated with hospice for ongoing care and medication management. Remain off of previous routine meds including BP meds and cholesterol meds  Pain in right shoulder due to bone metastasis and pain in left shoulder Right shoulder pain due to bone metastasis; left shoulder pain managed with topical treatments. Insurance coverage issues addressed with 5% lidocaine  patch. - Prescribed 5% lidocaine  patch for shoulder pain management. - Continue alternating lidocaine  patches daily. - Monitor pain levels and adjust treatment as needed.  She has available rx for morphine  or stronger pain meds through hospice if warranted. No changes at this time.  Folliculitis and skin irritation in groin region Folliculitis and skin irritation likely due to moisture. No cellulitis or abscess. Current wound care  effective. She has chronic skin sore in this area from moisture / friction  - Apply mupirocin ointment to affected areas twice daily for 1-2 weeks. - Continue current wound care regimen with distilled water, moisture-wicking pads, and ice packs. - Monitor for signs of infection or worsening symptoms.  Malnutrition and weight loss Significant weight loss and malnutrition due to difficulty with solid food intake. Emphasis on maintaining nutrition. - Encouraged intake of balanced, full meals as tolerated. - Monitored weight and nutritional status.  Chronic sinus congestion and headache Chronic sinus congestion and headache possibly due to infection. Current management includes Allegra and Flonase . - Prescribed amoxicillin  500 mg twice daily for 10 days. - Continue Allegra and Flonase  for allergy management. - Monitor for improvement in sinus symptoms.          No orders of the defined types were placed in this encounter.   Meds ordered this encounter  Medications   lidocaine  (LIDODERM ) 5 %    Sig: Remove & Discard patch within 12 hours or as directed by MD.  Apply on shoulders alternating each day    Dispense:  30 patch    Refill:  5   mupirocin ointment (BACTROBAN) 2 %    Sig: Apply 1 Application topically 2 (two) times daily.    Dispense:  22 g    Refill:  0   amoxicillin  (AMOXIL ) 400 MG/5ML suspension    Sig: Take 6.3 mLs (500 mg total) by mouth 2 (two) times daily for 10 days. Discard remainder.    Dispense:  200 mL    Refill:  0    changed per what was in stock    Follow up plan: Return in about 4 weeks (around 09/29/2024) for 4 week virtual mychart follow-up hospice updates / pain.   Patient verbalizes understanding with the above medical recommendations including the limitation of remote medical advice.  Specific follow-up and call-back criteria were given for patient to follow-up or seek medical care more urgently if needed.  Total duration of direct patient  care provided via video conference: 20 minutes   [name], DO [facility] 09/01/2024, 3:28 PM

## 2024-09-01 NOTE — Telephone Encounter (Signed)
 Photos documetned in encounter today  Jacqueline Officer, DO [facility] 09/01/2024, 7:46 PM

## 2024-09-02 ENCOUNTER — Other Ambulatory Visit: Payer: Self-pay

## 2024-09-04 ENCOUNTER — Telehealth: Payer: Self-pay

## 2024-09-04 ENCOUNTER — Other Ambulatory Visit (HOSPITAL_COMMUNITY): Payer: Self-pay

## 2024-09-04 NOTE — Telephone Encounter (Signed)
 Pharmacy Patient Advocate Encounter   Received notification from Onbase that prior authorization for lidocaine  (LIDODERM ) 5 %  is required/requested.   Insurance verification completed.   The patient is insured through [place].   Per test claim: PA required; PA submitted to above mentioned insurance via Latent Key/confirmation #/EOC [id number] Status is pending

## 2024-09-05 ENCOUNTER — Other Ambulatory Visit (HOSPITAL_COMMUNITY): Payer: Self-pay

## 2024-09-05 ENCOUNTER — Other Ambulatory Visit: Payer: Self-pay

## 2024-09-05 NOTE — Telephone Encounter (Signed)
 Pharmacy Patient Advocate Encounter  Received notification from [employer] that Prior Authorization for Lidocaine  5% patches has been APPROVED to 12.31.26. Ran test claim, Copay is $RTS, RX WAS LAST FILLED ON TODAY 11.21.25. This test claim was processed through [facility]- copay amounts may vary at other pharmacies due to pharmacy/plan contracts, or as the patient moves through the different stages of their insurance plan.   PA #/Case ID/Reference #: [id number]

## 2024-09-25 ENCOUNTER — Ambulatory Visit (INDEPENDENT_AMBULATORY_CARE_PROVIDER_SITE_OTHER): Admitting: Nurse Practitioner

## 2024-09-30 ENCOUNTER — Ambulatory Visit: Admitting: Family Medicine

## 2024-10-16 DEATH — deceased

## 2025-07-24 ENCOUNTER — Ambulatory Visit
